# Patient Record
Sex: Female | Born: 1940
Health system: Southern US, Community
[De-identification: ages and names within clinical notes are randomized; demographics above are authoritative.]

## PROBLEM LIST (undated history)

## (undated) DIAGNOSIS — I2510 Atherosclerotic heart disease of native coronary artery without angina pectoris: Secondary | ICD-10-CM

## (undated) DIAGNOSIS — I700 Atherosclerosis of aorta: Secondary | ICD-10-CM

## (undated) DIAGNOSIS — Z952 Presence of prosthetic heart valve: Secondary | ICD-10-CM

## (undated) DIAGNOSIS — I272 Pulmonary hypertension, unspecified: Secondary | ICD-10-CM

## (undated) DIAGNOSIS — N184 Chronic kidney disease, stage 4 (severe): Secondary | ICD-10-CM

## (undated) DIAGNOSIS — K8020 Calculus of gallbladder without cholecystitis without obstruction: Secondary | ICD-10-CM

## (undated) DIAGNOSIS — I10 Essential (primary) hypertension: Secondary | ICD-10-CM

## (undated) DIAGNOSIS — Z7901 Long term (current) use of anticoagulants: Secondary | ICD-10-CM

## (undated) DIAGNOSIS — E7800 Pure hypercholesterolemia, unspecified: Secondary | ICD-10-CM

## (undated) DIAGNOSIS — I739 Peripheral vascular disease, unspecified: Secondary | ICD-10-CM

## (undated) DIAGNOSIS — I4891 Unspecified atrial fibrillation: Secondary | ICD-10-CM

## (undated) DIAGNOSIS — I779 Disorder of arteries and arterioles, unspecified: Secondary | ICD-10-CM

## (undated) HISTORY — DX: Disorder of arteries and arterioles, unspecified: I77.9

## (undated) HISTORY — DX: Atherosclerotic heart disease of native coronary artery without angina pectoris: I25.10

## (undated) HISTORY — DX: Peripheral vascular disease, unspecified: I73.9

## (undated) HISTORY — PX: AORTIC VALVE REPLACEMENT: SHX41

---

## 1999-02-04 ENCOUNTER — Emergency Department (HOSPITAL_COMMUNITY): Admission: EM | Admit: 1999-02-04 | Discharge: 1999-02-04 | Payer: Self-pay | Admitting: Emergency Medicine

## 2001-04-18 ENCOUNTER — Encounter: Payer: Self-pay | Admitting: Family Medicine

## 2001-04-18 ENCOUNTER — Encounter: Admission: RE | Admit: 2001-04-18 | Discharge: 2001-04-18 | Payer: Self-pay | Admitting: Family Medicine

## 2001-08-26 ENCOUNTER — Ambulatory Visit (HOSPITAL_COMMUNITY): Admission: RE | Admit: 2001-08-26 | Discharge: 2001-08-27 | Payer: Self-pay | Admitting: Cardiovascular Disease

## 2001-08-26 ENCOUNTER — Encounter: Payer: Self-pay | Admitting: Cardiovascular Disease

## 2003-08-10 ENCOUNTER — Ambulatory Visit (HOSPITAL_COMMUNITY): Admission: RE | Admit: 2003-08-10 | Discharge: 2003-08-10 | Payer: Self-pay | Admitting: Cardiovascular Disease

## 2004-05-13 ENCOUNTER — Encounter: Admission: RE | Admit: 2004-05-13 | Discharge: 2004-05-13 | Payer: Self-pay | Admitting: Cardiovascular Disease

## 2004-05-18 ENCOUNTER — Ambulatory Visit (HOSPITAL_COMMUNITY): Admission: RE | Admit: 2004-05-18 | Discharge: 2004-05-18 | Payer: Self-pay | Admitting: Cardiovascular Disease

## 2004-05-18 HISTORY — PX: CARDIAC CATHETERIZATION: SHX172

## 2004-07-13 ENCOUNTER — Encounter (INDEPENDENT_AMBULATORY_CARE_PROVIDER_SITE_OTHER): Payer: Self-pay | Admitting: *Deleted

## 2004-07-13 ENCOUNTER — Inpatient Hospital Stay (HOSPITAL_COMMUNITY): Admission: RE | Admit: 2004-07-13 | Discharge: 2004-07-22 | Payer: Self-pay | Admitting: Surgery

## 2004-08-09 ENCOUNTER — Encounter: Admission: RE | Admit: 2004-08-09 | Discharge: 2004-08-09 | Payer: Self-pay | Admitting: Surgery

## 2004-08-22 ENCOUNTER — Encounter (HOSPITAL_COMMUNITY): Admission: RE | Admit: 2004-08-22 | Discharge: 2004-11-20 | Payer: Self-pay | Admitting: Cardiovascular Disease

## 2004-09-16 ENCOUNTER — Encounter: Admission: RE | Admit: 2004-09-16 | Discharge: 2004-09-16 | Payer: Self-pay | Admitting: Internal Medicine

## 2004-11-09 ENCOUNTER — Inpatient Hospital Stay (HOSPITAL_COMMUNITY): Admission: AD | Admit: 2004-11-09 | Discharge: 2004-11-15 | Payer: Self-pay | Admitting: Cardiovascular Disease

## 2004-11-10 HISTORY — PX: CARDIAC CATHETERIZATION: SHX172

## 2004-11-21 ENCOUNTER — Encounter (HOSPITAL_COMMUNITY): Admission: RE | Admit: 2004-11-21 | Discharge: 2005-02-19 | Payer: Self-pay | Admitting: Cardiovascular Disease

## 2005-05-12 ENCOUNTER — Encounter: Admission: RE | Admit: 2005-05-12 | Discharge: 2005-05-12 | Payer: Self-pay | Admitting: Internal Medicine

## 2005-12-14 ENCOUNTER — Encounter: Admission: RE | Admit: 2005-12-14 | Discharge: 2005-12-14 | Payer: Self-pay | Admitting: Internal Medicine

## 2007-04-24 ENCOUNTER — Encounter: Admission: RE | Admit: 2007-04-24 | Discharge: 2007-04-24 | Payer: Self-pay | Admitting: *Deleted

## 2008-02-26 ENCOUNTER — Other Ambulatory Visit: Admission: RE | Admit: 2008-02-26 | Discharge: 2008-02-26 | Payer: Self-pay | Admitting: Family Medicine

## 2008-05-20 ENCOUNTER — Inpatient Hospital Stay (HOSPITAL_COMMUNITY): Admission: RE | Admit: 2008-05-20 | Discharge: 2008-05-31 | Payer: Self-pay | Admitting: Obstetrics and Gynecology

## 2008-09-11 ENCOUNTER — Encounter: Admission: RE | Admit: 2008-09-11 | Discharge: 2008-09-11 | Payer: Self-pay | Admitting: Obstetrics and Gynecology

## 2010-05-19 HISTORY — PX: NM MYOCAR PERF WALL MOTION: HXRAD629

## 2011-02-21 NOTE — H&P (Signed)
NAMEMALASHIA, [name] NO.:  [id number]   MEDICAL RECORD NO.:  [id number]         PATIENT TYPE:  WOIB   LOCATION:                                FACILITY:  [facility]   PHYSICIAN:  [name] A. Delcambre, MDDATE OF BIRTH:  January 23, 1941   DATE OF ADMISSION:  05/21/2008  DATE OF DISCHARGE:                              HISTORY & PHYSICAL   She is a 70 year old gravida 62, para 5-0-0-5, recently with several  urinary tract infections, history complicated by an aortic valve  replacement and on Coumadin.  She has noted over the last year bulging  of her vaginal introitus, now coming to the point where it rubs on her  underwear, getting her pressure sensation and some pain in the vaginal  pelvic area.  She has trouble having bowel movements as well.  She has  to use laxative or strain sometimes a suppository, but she does not have  to reduce anything back into the vagina nor to have a bowel movement or  void.   PAST MEDICAL HISTORY:  1. Diabetes.  2. Heart failure or heart valve problems.  3. Peripheral vascular disease.  4. Coronary artery disease.   SURGICAL INTERVENTIONS:  CABG, coronary artery disease, angioplasty.   MEDICATIONS:  1. Clonazepam 0.5 mg b.i.d. p.r.n.  2. Tramadol 50 mg 2 p.o. q.6-8 h. p.r.n.  3. Metoprolol 50 mg 1-1/2 tablets daily.  4. Warfarin 5 mg p.o. daily except for Mondays and Fridays where she      takes 7.5 mg.  5. Lisinopril 20 mg 1 p.o. daily.  6. Pravastatin 40 mg p.o. nightly.  7. Bupropion 100 mg t.i.d.  8. Vitamin D 50,000 units every week.  9. Metformin 500 mg b.i.d.  10.Furosemide 20 mg p.o. daily.   ALLERGIES:  ADVICOR, ZETIA, and ZOCOR.   She states hemoglobin A1c is 6.3 last check, fasting sugars are 90s to  110 and 115.   SOCIAL HISTORY:  No tobacco, ethanol, or drug use.  She has history of  cigarette smoking in the past.  She is widowed.  Not sexually active.   FAMILY HISTORY:  MI, osteoporosis, hypertension.  Denies family  history  of breast, uterus, ovaries, cervix, colon cancer and pulmonic or  coronary artery disease other than herself, stroke, or diabetes in her  relatives.   REVIEW OF SYSTEMS:  Depression is positive.  Denies fever, chills,  rashes, lesions, headaches, dizziness, allergies, chest pain, shortness  of breath, wheezing, diarrhea, constipation, bleeding, melena,  hematochezia, urgency, frequency, dysuria, incontinence, or hematuria.  No galactorrhea, no emotional changes at this time.   PHYSICAL EXAMINATION:  GENERAL:  Alert and oriented x3.  VITALS:  Blood pressure 150/64, heart rate 60, respirations 20,  afebrile.  HEART:  Regular rate and rhythm.  Loud click is heard, left upper  sternal border.  LUNGS:  Clear bilaterally.  ABDOMEN:  Soft and nontender.  No hepatosplenomegaly or masses noted.  PELVIC:  Normal external female genitalia.  Bartholin, urethra, Skene  within normal limit.  Vault without discharge or lesions.  Vault is well  suspended.  Large  prolapsing cystocele which comes out of introitus is  noted.  RECTOVAGINAL:  There is a small rectocele that may not need repair or  operation, if we did proceed with operation I will evaluate this.   CARDIAC HISTORY:  She did see her cardiologist, Dr. Gwenlyn [name].  There were  no EKG changes.  Post-stress ejection fraction 72%, global left  ventricular systolic function is normal, ventricular function is  preserved, and no significant wall motion abnormalities are noted.  Normal myocardial perfusion scan demonstrating an attenuation artifact  in the anterior region of the myocardium.  No ischemia or infarct scar  is seen in the remaining myocardium.  Compared to prior study, perfusion  improved, no significant ischemia demonstrated, this stated to be a low-  risk scan and the patient is cleared for surgery.  Furthermore, she has  had Doppler studies revealed moderate left ICA stenosis unchanged from  prior study.  Lower extremity  Dopplers revealed patent left iliac stent,  right iliac appears to be quite progressed, but she denies claudication.   ASSESSMENT:  Cystic medical as well as symptomatic repair of the  cystocele indicated.  Cystocele 618.01, rectocele 618.04, may not need  report, pelvic pain 625.9 from the above.   PLAN:  Discussed management versus definitive management with surgical  repair.  Pessary could be used.  She did try a pessary fit but this did  not work.  For this reason, she wishes to go and get operative repair  done per Cardiology recommendations.  We will continue Coumadin until  the evening prior to surgery, reversed with vitamin K, switched over  heparin prior to that and get therapeutic.  Stop the heparin about 2:30  to 3.  Draw a PT and PTT at 6:30.  Proceed with surgery as long as coag  parameters within normal limits.  She will be n.p.o. past midnight.  All  preoperative medications will be continued except with medications taken  in the morning with sips of water.  She gives informed consent, accepts  for risks of infection, bleeding, bowel and bladder damage, blood  product risk including hepatitis and HIV exposure, DVT risk, heart  failure, MI risk, and difficulty voiding prolonged catheter.  All  questions were answered and we will proceed as outlined.  SCDs  __________ to be used perioperatively as well.      [name] A. Bing Matter, MD  Electronically Signed     CAD/MEDQ  D:  05/19/2008  T:  05/20/2008  Job:  [id number]   cc:   [name], M.D.  [phone]   [name], M.D.  Fax: [phone]   [name], M.D.

## 2011-02-21 NOTE — Op Note (Signed)
[name], [name]                ACCOUNT NO.:  [id number]   MEDICAL RECORD NO.:  [id number]          PATIENT TYPE:  INP   LOCATION:  9310                          FACILITY:  [facility]   PHYSICIAN:  Charles A. Delcambre, MDDATE OF BIRTH:  November 12, 1940   DATE OF PROCEDURE:  DATE OF DISCHARGE:                               OPERATIVE REPORT   PREOPERATIVE DIAGNOSIS:  Cystocele.   POSTOPERATIVE DIAGNOSIS:  Cystocele.   PROCEDURE:  Anterior repair.   SURGEON:  Charles A. Bing Matter, MD.   Terrence DupontLandry Mellow.   COMPLICATIONS:  None.   ESTIMATED BLOOD LOSS:  Less than 25 mL.   OPERATIVE FINDINGS:  Very large cystocele and minimal rectocele, not  requiring repair at this time.   SPECIMENS:  None.   Instrument, sponge, and needle count correct x2.   ANESTHESIA:  General by endotracheal route.   DESCRIPTION OF PROCEDURE:  The patient was taken to the operating room,  placed in supine position and general anesthetic was dosed.  Sterile  prep and drape was undertaken.  Weighted speculum was placed in the  vault.  Cuff was identified and grasped with towels.  The clamps and  retractors was removed.  Lidocaine 1% with 1:100 epinephrine was  injected submucosally in the vagina over the cystocele to help with  hemostasis.  Total of 10 mL was injected.  Knife was then used to score  the vaginal cuff without damage to bowel or bladder.  The bladder and  cystocele were very thin, however, and careful dissection did develop  the bladder mucosa off of the vaginal mucosa exposing the cystocele.  Flaps were created, some sharp dissection with Metzenbaum scissors and  blunt dissection was used to develop the bladder flaps, and weak areas  were strengthened with 2-0 Vicryl with good hemostasis.  The large  cystocele was then replaced with a pursestring and 2-0 Vicryl reducing  well to a smaller size, allowing further repair.  A 2-0 Vicryl was then  used to create endofascial shelf, plicating the  endofascia with  individual sutures giving a good repair.  Hemostasis was adequate.  Excess vaginal mucosa was resected and the vaginal incision was  then closed with 2-0 Vicryl running locking suture.  Hemostasis was  excellent.  A 1-inch gauze with Estrace cream was then placed into the  vagina for vaginal pack.  Hemostasis was excellent at the termination of  procedure.  She was awakened and taken to recovery with physician in  attendance.      Charles A. Bing Matter, MD  Electronically Signed     CAD/MEDQ  D:  05/21/2008  T:  05/21/2008  Job:  [id number]

## 2011-02-24 NOTE — Op Note (Signed)
NAMEJOLYNNE, [name] NO.:  [id number]   MEDICAL RECORD NO.:  [id number]          PATIENT TYPE:  INP   LOCATION:  2307                         FACILITY:  [facility]   PHYSICIAN:  Nelda Severe. Tobias Alexander, M.D.  DATE OF BIRTH:  November 27, 1940   DATE OF PROCEDURE:  07/13/2004  DATE OF DISCHARGE:                                 OPERATIVE REPORT   PROCEDURE:  Transesophageal echocardiogram.   OPERATOR:  Nelda Severe. Tobias Alexander, M.D.   INDICATIONS:  The patient has coronary artery disease and aortic stenosis,  is to undergo aortic valve replacement and coronary artery bypass grafting  by Dr. Gilford Raid.  TEE was requested by Dr. Cyndia Bent for the intraoperative  management of the patient.   DESCRIPTION OF PROCEDURE:  Following a routine cardiac induction, the  transesophageal probe was covered with a latex free sheath, lubricated and  inserted into the patient's oropharynx and esophagus without difficulty.  Overall cardiac images demonstrated no evidence of pericardial effusion or  masses. The right atrium appeared normal in size, there was no evidence of  masses or thrombus in the right atrium.  The inner atrial septum had no  evidence of defect by color Doppler.   The tricuspid valve appeared normal in structure with trace regurgitation.  The pulmonary artery catheter was seen crossing the valve, entering the  right ventricle.  The right ventricle had good contractility with no  evidence of segmental wall motion abnormalities, no masses or thrombus were  seen in the right ventricle.   The left atrium was then evaluated, which was normal sized, had no evidence  of thrombus or masses.  The mitral valve had 1+ regurgitation with a small  jet tracking along the lateral wall.  Pulmonary vein flows demonstrated  normal forward flow with an S wave of 45 and a D wave of 35.  There was no  evidence of reversal of flow in the left or right pulmonary vein.   The left ventricle was slightly  thickened with left ventricular wall  thickness of 1.1-1.2 cm.  It demonstrated good contractility with no  evidence of segmental wall motion abnormalities.   [name] aortic valve was then evaluated, which appeared to be  bicuspid.  By planimetry, there was an area of about 0.9 cm/sq. There was no  regurgitation but severe stenosis was evident.  Peak flows across the valve  were measured and found to be over 400, averaging about 420 cm/second.  The  aortic valve area was calculated at 0.78 cm/sq.  The patient had some mild  atherosclerotic disease of the descending aorta, but otherwise, the exam was  normal.   Following replacement of the aortic valve and coronary artery bypass  grafting, the patient was evaluated again.  The prosthetic valve appeared to  be functioning normally. There was no evidence of a perivalvular leak and  there was no  evidence of stenosis on Doppler flow.  The left ventricle appeared to have  good contractility and there was no evidence of segmental wall motion  abnormalities.  The probe was then removed, and the patient  was taken to the  SICU in good condition.       JDS/MEDQ  D:  07/13/2004  T:  07/13/2004  Job:  [id number]

## 2011-02-24 NOTE — Cardiovascular Report (Signed)
NAME:  [name], [name]                          ACCOUNT NO.:  [id number]   MEDICAL RECORD NO.:  [id number]                   PATIENT TYPE:  OIB   LOCATION:  2854                                 FACILITY:  [facility]   PHYSICIAN:  Quay Burow, M.D.                DATE OF BIRTH:  12/07/1940   DATE OF PROCEDURE:  05/18/2004  DATE OF DISCHARGE:  05/18/2004                              CARDIAC CATHETERIZATION   INDICATIONS FOR PROCEDURE:  The patient is a 70 year old white female with a  history of hypertension and hyperlipidemia, valvular heart disease.  The  patient has had stenting of her left iliac artery for claudication in the  past and has moderate carotid disease on the left side.  She has severe  aortic stenosis which we are following by echocardiogram with a valve area  of approximately 0.4 cm squared.  The patient is asymptomatic.  Because of  the critical nature of her valvular heart disease, she presents now for  right and left heart catheterization to define her anatomy and validate her  valve area.   DESCRIPTION OF PROCEDURE:  The patient was brought to the second floor [facility]iac catheterization lab in the postabsorptive state.  She was  premedicated with p.o. Valium.  Her right groin was prepped and shaved in  the usual sterile fashion.  1% Xylocaine was used for local anesthesia.  A 7  French sheath was inserted into the right femoral artery using standard  Seldinger technique.  An 8 French sheath was inserted into the right femoral  vein.  A 7 French balloon-tip thermodilution Swan-Ganz catheter was used to  obtain sequential right heart pressures, Fick and thermodilution cardiac  outputs.  5 French left 3.5 diagnostic catheter along with a 6 French right  No-Torque catheter, 6 [place] multipurpose A1 catheter and pigtail catheter  were used for selective coronary angiography, root angiography, and distal  abdominal aortography.  Visipaque dye was used for the  entirety of the case.  Retrograde aortic pressure was monitored during the case.  Simultaneous LV  and AO pullback pressures were obtained.   HEMODYNAMICS:  RA 4 mm, RV 29/8, PA 29/7 mean of 18, wedge 10.  Cardiac  output was 3.5 liters per minute with index of 2 liters per minute per meter  square by Fick.  The peak instantaneous gradient was 60 mmHg with a mean of  50. The aortic valve area measured approximately 0.46 cm square by Fick.  The aortic pressure was 145/52 with a left ventricular pressure of 218/19.   SELECTIVE CORONARY ANGIOGRAPHY:  1. Left main normal.  2. LAD; the LAD had approximately 50 to 70% midstenosis.  3. Left circumflex; free of significant disease.  4. Right coronary artery; dominant with a 50% stenosis at the crux of the     vessel.  5. Supravalvular aortography performed in the RAO view using 20 mL of  Visipaque dye at 20 mL per second.  There was no aortic insufficiency     noted.  The aortic root appeared normal in dimension.  There was no     leaflet motion noted fluoroscopically.  6. Distal abdominal aortography performed using 20 mL of Omnipaque dye at 20     mL per second. The renal arteries were widely patent.  There is mild     narrowing of the infrarenal abdominal aorta.  There is 50% right common     iliac artery ostial stenosis.  There is 90% in-stent restenosis within     the midportion of the left iliac stent at the distal portion of the     common and origin of the internal iliac.   IMPRESSION:  The patient has moderate two vessel CAD, preserved LV function,  and critical AS.  Plans will be to hydrate her, send her home later today as  an outpatient. I will see her back in the office in one week.  We will  discuss referral to CVTS for elective CABG/AVR.  The patient left the lab in  stable condition.                                               Quay Burow, M.D.    Geralynn Rile  D:  05/18/2004  T:  05/19/2004  Job:  [id number]   cc:    [facility]   Marcelino Duster, M.D.  [phone] [address] [place] [address]  Fax: [phone]

## 2011-02-24 NOTE — Discharge Summary (Signed)
NAMEROGAN, NOA                ACCOUNT NO.:  [id number]   MEDICAL RECORD NO.:  [id number]          PATIENT TYPE:  INP   LOCATION:  9310                          FACILITY:  [facility]   PHYSICIAN:  Charles A. Delcambre, MDDATE OF BIRTH:  1941/03/22   DATE OF ADMISSION:  05/20/2008  DATE OF DISCHARGE:  05/31/2008                               DISCHARGE SUMMARY   PRIMARY DISCHARGE DIAGNOSES:  1. Cystocele.  2. Mechanical aortic valve requiring anticoagulation.   PROCEDURE:  1. Admission for anticoagulation management.  2. Anterior repair of cystocele.  3. Management of anticoagulation until discharge.   SPECIMENS:  None.   LABORATORY:  Final hematocrit 33.3, hemoglobin 11.2, platelets 264,  final INR prior to discharge 4.0 with a target of 3-4 per Cardiology.   HISTORY AND PHYSICAL:  As dictated on the chart.   HOSPITAL COURSE:  The patient was admitted, underwent vitamin K  injection to reverse protime.  Heparin was utilized and once protime was  noted to be 1.4 a.m. after the admission, heparin had been stopped at  0200, and we proceeded with anterior repair.  Heparin was restarted at  1700 and 10 mg with Coumadin was given the evening of surgery.  Coumadin  was continuously monitored with pharmacy, and the heparin was adjusted  as needed to maintain therapeutic dosing until heparin could be  discontinued with therapeutic dose of Coumadin.  At time of discharge,  INR was 4.1 and she was discharged home to follow up in the Cardiology  Coumadin Clinic and see me back in 4 weeks.      Charles A. Bing Matter, MD  Electronically Signed     CAD/MEDQ  D:  06/24/2008  T:  06/25/2008  Job:  [id number]

## 2011-02-24 NOTE — Cardiovascular Report (Signed)
[place]  Patient:    [name], [name] Visit Number: [id number] MRN: [id number]          Service Type: DSU Location: [id number] Attending Physician:  [name], [name] Dictated by:   Lorretta Harp, M.D. Proc. Date: 08/26/01 Admit Date:  08/26/2001   CC:         [name] Angiographic Suite (6th floor)  [name], M.D.  [facility]; [address], [place], [place] [address]                        Cardiac Catheterization  PROCEDURE:  Abdominal aortogram with bipolar runoff, percutaneous transluminal angioplasty and stent procedure.  SURGEON:  Lorretta Harp, M.D.  INDICATIONS:  [name] is a 70 year old moderately overweight widowed white female, mother of five children, referred by Dr. [name] for evaluation of claudication.  Cardiac history is positive for one pack per day tobacco abuse, non-insulin requiring diabetes, hypertension, hyperlipidemia and a positive family history for heart disease.  She has been complaining of claudication for years, left greater than right limiting her ambulation less than one block.  Lower extremity Dopplers performed revealing a right brachial index of 0.8 and a left of 0.4.  She had moderate aortic stenosis above an area of 1 cm sq, carotid Dopplers which were essentially benign with mild left ICA stenosis.  The cardiac stress test showed no significant ischemia.  The patient presents now for aortography, bifemoral runoff and potential endovascular therapy.  DESCRIPTION OF PROCEDURE:  The patient is brought to the [facility] Sixth Floor Peripheral Vascular Angiographic Suite in the postabsorptive state.  She is premedicated with p.o. Valium.  Both groins were prepped and shaved in the usual sterile fashion.  1% Xylocaine was used for local anesthesia.  A 5 French sheath was inserted into the right femoral artery using standard Seldinger technique.  A 5  [place] tennis racket catheter was used for midstream and distal abdominal aortography as well as bifemoral runoff.  Visipaque dye was used for the entirety of the case.  Retrograde aortic pressure was used for the remainder of the case.  ANGIOGRAPHIC RESULTS: 1. Abdominal aorta.    a. Renal arteries: Negative.    b. Infrarenal abdominal aorta:  Moderate atherosclerosis. 2. Left lower extremity.    a. Totally occluded left common iliac with reconstitution at the       external iliac artery level.    b. Three vessel runoff. 3. Right lower extremity.    a. 40% right common iliac artery stenosis.    b. Three vessel runoff.  IMPRESSION:  [name] has a totally occluded right common iliac artery.  We will proceed with endovascular therapy.  DESCRIPTION OF PROCEDURE:  Using antegrade injection from the contralateral side as well as a Smart needle, retrograde access was obtained.  Using a long 7 French 37 cm long Cordis sheath along with a 5 [place] end hole catheter and an 0.35 angle Glidewire, access was obtained to the aorta through the occluded segment.  The end-hole catheter was then advanced into the distal aorta and aortic pressures were monitored.  A Wooley wire was then exchanged and PTA was performed with a 4 x 6 followed by 4 x 6 POWERFLEX resulting in restoration of antegrade flow.  A 8 x 6 Smart stent was then deployed at the ostium of the left common iliac artery extending to the internal external  bifurcation and post dilated with a 6 x 6 POWERFLEX resulting in a reduction of long segment total occlusion to 0% residual.  The patient tolerated the procedure well. Completion abdominal aortography was performed.  Both sheaths were removed, and the ACT was measured.  Pressure was held on each groin to achieve hemostasis. The patient left the lab in stable condition.  She will be hydrated overnight and treated with aspirin and Plavix.  OVERALL IMPRESSION:  Successful left common  iliac artery percutaneous transluminal angioplasty and stenting of a total occlusion.  Both sheaths were removed, and the ACT was measured.  DISPOSITION:  She will be discharged home in the morning and obtain outpatient Dopplers.  She will see me back in the office in two to three weeks in follow-up.Dictated by:   Lorretta Harp, M.D. Attending Physician:  [name], [name] DD:  08/26/01 TD:  08/26/01 Job: [id number]

## 2011-02-24 NOTE — Discharge Summary (Signed)
[name], [name] NO.:  [id number]   MEDICAL RECORD NO.:  [id number]          PATIENT TYPE:  INP   LOCATION:  2025                         FACILITY:  [facility]   PHYSICIAN:  Gilford Raid, M.D.     DATE OF BIRTH:  1940/12/31   DATE OF ADMISSION:  07/13/2004  DATE OF DISCHARGE:  07/21/2004                                 DISCHARGE SUMMARY   ADMISSION DIAGNOSIS:  Aortic stenosis and two vessel coronary artery  disease.   DISCHARGE DIAGNOSIS:  1.  Hypertension.  2.  Hyperlipidemia.  3.  History of heavy smoking.  4.  Peripheral vascular disease.  5.  Non-insulin dependent diabetes mellitus.  6.  Status post left common iliac artery stenting for claudication.  7.  Status post hysterectomy.  8.  Aortic stenosis status post aortic valve replacement with a St. Jude      Regent mechanical valve.  9.  Two vessel coronary artery disease status post coronary artery bypass      grafting x 2.  10. Anxiety.  11. Postoperative atrial fibrillation which converted back to normal sinus      rhythm on intravenous Amiodarone.   ALLERGIES:  Zocor, Plavix, Zetia.   BRIEF HISTORY:  The patient was evaluated at the [facility] office by Dr. Cyndia Bent  after referral from Dr. [name] for aortic valve replacement and coronary  bypass surgery.  The patient is a 70 year old female with a history of  hypertension, hyperlipidemia, peripheral vascular disease, and smoking.  She  has been followed for sometime for her aortic stenosis.  The patient  remained relatively asymptomatic, however, a recent echocardiogram in July  2005 showed an aortic valve area of 0.38 cm square with normal left left  ventricular function.  She also underwent a right and left heart cath on  May 18, 2004, which revealed two vessel coronary artery disease.  She was  also noted to have a 90% focal restenosis and left common iliac artery  stent.  Echocardiogram performed on April 20, 2004, showed a normal left  ventricular ejection fraction with mild asymmetric left ventricular  hypertrophy and no regional wall abnormalities.  The mitral valve was  normal.  Carotid Doppler examination showed no significant internal carotid  artery stenosis bilaterally.  After evaluation of the patient, it was Dr.  Vivi Martens opinion that the patient would be best served with an aortic valve  replacement and coronary artery bypass graft surgery.   HOSPITAL COURSE:  The patient was admitted and taken to the OR on July 18, 2004, for coronary artery bypass graft surgery x 2 and an aortic valve  replacement.  The left internal mammary artery was grafted to the left  anterior descending and saphenous vein was grafted to the posterolateral  branch of the right coronary artery.  The aortic valve was replaced using a  19 mm St. Jude regent mechanical valve.  Endoscopic vessel harvesting was  performed on the right leg.  The patient tolerated the procedure well and  was hemodynamically stable immediately postoperatively.  The patient was  transferred from the OR to  the SICU in stable condition.  The patient was  extubated without complications and woke up from anesthesia neurologically  intact.  The patient was given a unit of packed RBCs during the procedure.   On postoperative day one, the patient was afebrile and with stable vital  signs and was maintaining a normal sinus rhythm.  Her EKG showed no ischemic  changes.  On physical exam, she was alert with neurological exam intact and  the lungs revealed a slight wheeze.  Heart was regular rate and rhythm with  a crisp valve click.  Lower extremities revealed mild edema.  The patient  was in stable condition and invasive lines and chest tubes were discontinued  in a routine manner.  The patient was volume overloaded postoperatively and  has been diuresed accordingly.  [name] Wellbutrin and Xanax were  restarted for anxiety.  She was also restarted on her Advair  for a history  of COPD and smoking.  The patient was subsequently transferred from the SICU  to unit [id number] without complications.   The patient began cardiac rehab on postop day one and initially did not  tolerate this very well.  Her gait was steady, however, she had to rest  several times in the beginning of her rehab and also required increased O2  via nasal cannula secondary to desaturation with ambulation.  The patient  has continued to ambulate with cardiac rehab and has progressed to a  satisfactory manner at this point and is doing so with a rolling walker  without further assistance and is able to maintain her O2 saturation at an  appropriate level.   On postoperative day five, the patient was noted to be in atrial  fibrillation.  Her heart rate ranged between the 120s and 140s.  Her vital  signs were, otherwise, stable.  She was still requiring 2 liters of oxygen  via nasal cannula at that time.  The patient was started on an Amiodarone  drip for her atrial fibrillation.  The patient was already on a beta blocker  and this was continued.  On physical exam, the patient was also still volume  overloaded and her diuresis was continued.  By postoperative day six, the  patient had converted back to a normal sinus rhythm and was maintaining this  and, therefore, the Amiodarone was discontinued.  [name] vital signs  were again stable with the exception of the continued need for 2 liters  oxygen via nasal cannula.   [name] pulmonary status has improved within the last few days of the  postoperative course.  She has been encouraged with her incentive spirometry  which she has been doing quite well with.  The patient has also been  continued on Advair.  She has been successfully weaned from oxygen at this  time and is maintaining acceptable oxygen saturations.  The patient has been anticoagulated with Coumadin since postoperative day two secondary to  mechanical  valve.   This obviously will need to be continued after discharge  with regular INR checks.   On postoperative day eight, the patient is afebrile and has stable vital  signs.  She is maintaining sinus rhythm.  Her weight is approximately  equivalent to her preoperative weight and she has diuresed well  postoperatively.  On physical exam, the patient states she is feeling  better.  The lung sounds are slightly diminished in the bases.  Cardiac  reveals regular rate and rhythm with a 2/6 systolic flow murmur.  There are  no gallops and no aortic insufficiency.  The abdomen is benign and there is  no tympani or distention.  The incisions are healing well and the  extremities reveal slight edema.  The patient has continued to make steady  progress and will need to continue her rehab after discharge.  The patient  will also be instructed to continue to work on her pulmonary toilet.  The  patient is in stable condition at this time and is felt to be ready for  discharge within 1-2 days pending morning round re-evaluation.   LABORATORY DATA:  CBC and BMP on July 21, 2004, with white count 6.8,  hemoglobin 11, hematocrit 34, platelets 457, sodium 132, potassium 4, BUN  12, creatinine 1.1, glucose 116.  PT and INR on July 21, 2004, 20.8 and  2.3.   CONDITION ON DISCHARGE:  Improved.   DISCHARGE MEDICATIONS:  1.  Toprol XL 50 mg daily.  2.  Lisinopril 10 mg daily.  3.  Lipitor 20 mg daily.  4.  Lasix 40 mg daily x 7 days.  5.  Potassium chloride 40 mEq daily x 7 days.  6.  Actos 30 mg daily.  7.  Alprazolam 0.25 mg b.i.d.  8.  Wellbutrin 150 mg b.i.d.  9.  Advair 1 puff b.i.d.  10. Colace 100 mg b.i.d. p.r.n. constipation.  11. Coumadin 5 mg daily then as directed by Dr. [name].  [name] INR      will need to be maintained between 2.5 and 3.5.  12. Glucophage 500 mg b.i.d.  13. Ultram 50 mg 1-2 p.o. q.4-6h. p.r.n. pain.   DISCHARGE INSTRUCTIONS:  Activities:  No driving and no lifting  more than 10  pounds and the patient is to continue daily breathing and walking exercises.  Diet:  Low salt, low fat with carbohydrate modified medium calorie per ADA  recommendations.  Wound care:  The patient may shower daily and clean the  incisions with soap and water.  If the incisions become red, swollen, or  draining, the patient has a fever of 101, she is to contact the CVTS office.  Special instructions:  The patient is to have her PT and INR checked on  July 25, 2004, by Dr. Kennon Holter office.  The patient will be instructed to  contact Dr. Kennon Holter office to arrange a date and time.  Follow up with Dr.  [name] two weeks after discharge.  The patient will be instructed to contact  his office to establish an appointment.  The patient is to have a chest x-  ray taken at [facility] one hour before the follow up  appointment with Dr. Cyndia Bent on August 09, 2004, at 12:45 p.m.      [place]   AY/MEDQ  D:  07/21/2004  T:  07/21/2004  Job:  [id number]   cc:   Quay Burow, M.D.  Fax: [phone]

## 2011-02-24 NOTE — Op Note (Signed)
NAMECHANIA, EKDAHL NO.:  [id number]   MEDICAL RECORD NO.:  [id number]          PATIENT TYPE:  INP   LOCATION:  2307                         FACILITY:  [place]   PHYSICIAN:  Gilford Raid, M.D.     DATE OF BIRTH:  1941-06-23   DATE OF PROCEDURE:  07/13/2004  DATE OF DISCHARGE:                                 OPERATIVE REPORT   PREOPERATIVE DIAGNOSES:  1.  Two-vessel coronary artery disease.  2.  Critical aortic stenosis.   POSTOPERATIVE DIAGNOSES:  1.  Two-vessel coronary artery disease.  2.  Critical aortic stenosis.   OPERATIVE PROCEDURES:  1.  Median sternotomy.  2.  Extracorporeal circulation.  3.  Coronary artery bypass graft surgery x 2 using a left internal mammary      artery graft to the left anterior descending coronary artery with a      saphenous vein graft to the posterolateral branch of the right coronary      artery.  4.  Aortic valve replacement using a 19 mm St. Jude Regent mechanical valve.  5.  Endoscopic vein harvesting from the right leg.   ATTENDING SURGEON:  Gilford Raid, M.D.   FIRST ASSISTANT:  Lanelle Bal, M.D.   SECOND ASSISTANT:  Suzzanne Cloud, P.A.   ANESTHESIA:  General endotracheal.   CLINICAL HISTORY:  The patient is a 70 year old woman with a history of  hypertension, hyperlipidemia, heavy smoking and peripheral vascular disease,  who has been followed for aortic stenosis.  She has remained relatively  asymptomatic, but an echocardiogram in July of 2005 showed an aortic valve  area of 0.38 sq cm with normal left ventricular function.  She underwent  right and left heart catheterization on May 18, 2004, which showed a  valve area of 0.46 cm sq cm with a peak gradient of 60 mmHg.  There was also  two-vessel coronary artery disease with 50-70% LAD stenosis and a 50% distal  right artery stenosis before a large posterolateral branch.  She also had  about 90% focal restenosis with in the left common iliac artery  stent.  Right and left heart pressures were within normal limits.  The left  ventricular ejection fraction was normal by echocardiogram with mild  asymmetric left ventricular hypertrophy and no regional wall motion  abnormalities.  The mitral valve appeared normal without regurgitation.  After review of the angiogram and examination of the patient, it was felt  that aortic valve replacement and coronary artery bypass surgery was the  best treatment to prevent further ischemia, infarction and complications of  aortic stenosis.  I discussed the operative procedure with the patient and  her family, including alternatives, benefits and risks, including bleeding,  blood transfusion, infection, stroke, myocardial infarction, graft failure  and death.  I also discussed the use of a mechanical valve, including the  need for lifelong anticoagulation with Coumadin.  She understood and agreed  to proceed with surgery.   DESCRIPTION OF PROCEDURE:  The patient was taken to the operating room and  placed on the table in the supine position.  After induction of  general  endotracheal anesthesia, a Foley catheter was placed in the bladder using  sterile technique.  Then the chest, abdomen and both lower extremities were  prepped and draped in usual sterile manner.  A transesophageal  echocardiogram was performed by anesthesiology.  This showed a bicuspid  aortic valve with severe aortic stenosis.  The valve area was measured at  0.7-0.8 sq cm.  There was no mitral regurgitation.  Left ventricular  function appeared well preserved with mild left ventricular hypertrophy.   Then the chest, abdomen and both lower extremities were prepped and draped  in usual sterile manner.  The chest was entered through a median sternotomy  incision.  The pericardium was opened in the midline.  Examination of the  heart showed good ventricular contractility.  The ascending aorta had no  palpable plaques in it.   Then the  left internal mammary artery was harvested from the chest wall as a  pedicle graft.  This was a medium caliber vessel with excellent blood flow  through it.  At the same time, a segment of greater saphenous vein was  harvested from the right leg using endoscopic vein harvest technique.  This  vein was of medium size and good quality.   Then the patient was heparinized and when an adequate activating clotting  time was achieved, the distal ascending aorta was cannulated using a 20  French aortic cannula for arterial inflow.  Venous outflow was achieved  using a two-stage venous cannula to the right atrial appendage.  An  antegrade cardioplegia and vent cannula were inserted in the aortic root.  A  left ventricular vent was placed through the right superior pulmonary vein.  A retrograde cardioplegia cannula was inserted through the right atrium into  the coronary sinus.   The patient was placed on cardiopulmonary bypass and the distal coronary was  identified.  The LAD was a large graftable vessel with no significant distal  disease in it.  The right coronary artery had diffuse disease in its  proximal and mid portions.  There was some plaque present distally in the  area of stenosis noted on angiogram.  The posterior descending was a small  vessel, but the posterolateral was a large vessel.   Then the aorta was cross clamped and 500 mL of cold blood antegrade  cardioplegia was administered in the aortic root with quick arrest of the  heart.  Systemic hypothermia to 20 degrees Centigrade and topical  hypothermia with iced saline was used.  A temperature probe was placed in  the septum and insulating pad in the pericardium.   Then the first distal anastomosis was performed to the posterolateral branch  of the right coronary artery.  The internal diameter was about 1.75 mm.  The  conduit used was a segment of greater saphenous vein and the anastomosis was performed in end-to-side manner  using continuous 7-0 Prolene suture.  Flow  was noted through the graft and was excellent.   The second distal anastomosis was performed to the mid portion of the left  anterior descending coronary artery.  The internal diameter was about 2 mm.  The conduit used was the left internal mammary artery graft and this was  brought through an opening in the left pericardium anterior to the phrenic  nerve.  It was anastomosed to the LAD in an end to side manner using  continuous 8-0 Prolene suture.  The pedicle was tacked to the epicardium  with 6-0 Prolene sutures.  Then another dose of cardioplegia was given.  Additional doses of cardioplegia were given at about 30-minute intervals,  maintaining myocardial temperature around 10 degrees Centigrade.   Attention was turned to aortic valve replacement.  The aorta was opened  transversely about 1 cm above the takeoff of the right coronary artery.  Examination of the native valve showed that it was a bicuspid valve with  marked thickening and fibrosis of the annulus.  The native valve was excised  without difficulty.  There was minimal calcification present.  The annulus  was truly a fish mouth shaped opening that was very thick and fibrous.  The  left ventricle and aortic root were irrigated with iced saline solution.  The annulus was sized and a 19 mm St. Jude Regent mechanical valve was  chosen.  This had model #19AGN-751, serial [id number].  Then a series of  pledgeted 2-0 Ethibond horizontal mattress sutures were placed around the  annulus with the pledgets in a subannular position.  The sutures were placed  through a __________ ring and the valve lowered into place.  The sutures  were tied sequentially and the valve seated nicely.  The leaflets moved  normally.  The right and left coronary ostiae were identified and were not  obstructed.  The patient was then rewarmed to 37 degrees Centigrade.  The  aorta was closed in two layers using  continuous 4-0 Prolene suture with felt  strips to reinforce her relatively thin aorta.  Then the single proximal  vein graft anastomosis was performed to the aortic root in end-to-side  manner using continuous 6-0 Prolene suture.   The head was placed in the Trendelenburg position.  After the left side of  the heart was deaired, the clamp was removed from the mammary pedicle.  There was rapid rewarming of the ventricular septum and return of  spontaneous ventricular fibrillation.  The cross clamp was removed with a  time of 108 minutes and the patient spontaneously converted to sinus rhythm.   The proximal and distal anastomoses appeared hemostatic and the lie of the  graft satisfactory.  Graft markers were placed around the proximal  anastomoses.  Two temporary right ventricle and right atrial pacing wires  were placed and brought out through the skin.   When the patient had rewarmed to 37 degrees Centigrade, she was weaned from cardiopulmonary bypass on low-dose dopamine.  The total bypass time was 149  minutes.  Cardiac function appeared excellent with a cardiac output at 5  L/min.  Protamine was given and the venous and aortic cannulas were removed  without difficulty.  Transesophageal echocardiogram was performed and showed  a normal functioning St. Jude aortic valve prosthesis.  There was no  evidence of perivalvular leak or regurgitation.  There was no mitral  regurgitation.  Hemostasis was achieved.  Then three chest tubes were placed  with two in the posterior pericardium, one in the left pleural space and one  in the anterior mediastinum.  The pericardium could not be reapproximated  over the heart.  The sternum was closed with #6 stainless steel wires.  The  fascia was closed with continuous 1-0 Vicryl suture.  The subcutaneous  tissues were closed with continuous 2-0 Vicryl and the skin with 3-0 Vicryl  subcuticular closure.  The lower extremity vein harvest site was closed  in  layers in a similar manner.  The sponge, needle and instrument counts were  correct according to the scrub nurse.  Dry sterile dressings were  applied  over the incisions around the chest tubes, which were hooked to Pleur-Evac  suction.  The patient remained hemodynamically stable and was transferred to  the SICU in guarded, but stable condition.      Kingsley Plan   BB/MEDQ  D:  07/13/2004  T:  07/13/2004  Job:  [id number]   cc:   [facility]TS Office   Quay Burow, M.D.  Fax: [phone]   Cardiac Catheterization Laboratory

## 2011-02-24 NOTE — Discharge Summary (Signed)
[place]  Patient:    DANNY, PONCEDELEON Visit Number: [id number] MRN: [id number]          Service Type: DSU Location: [phone] Attending Physician:  [name], [name] Dictated by:   Evern Core Johny Blamer, R.N., N.P. Admit Date:  08/26/2001 Discharge Date: 08/27/2001   CC:         Tammy R. Modena Morrow, M.D., [place], [place]   Discharge Summary  HISTORY OF PRESENT ILLNESS:  Ms. Ezma Qiu is a 70 year old female patient of Lorretta Harp, M.D., who was seen in the office by referral of Tammy R. Modena Morrow, M.D.  She apparently was having claudication, left greater than right. She went on to have lower extremity Dopplers and it revealed a right ABI of 0.8 and left of 0.4 with high frequency monophasic signals in the iliac region suggesting both supra and infrainguinal disease.  Thus she came to the hospital for PV angiogram.  In the office, she had also had a 2-D echocardiogram because of a murmur and she has an aortic stenosis.  Aortic valve area about 1 cm square.  She had her PV angiogram on August 26, 2001, and it revealed high grade disease in her left common iliac artery. She has three vessel runoff.  Her right lower extremity, she had 40% right common iliac artery stenosis.  She had three vessel runoff. She underwent PTA and stenting reduced from 100% to 0%. She was placed on aspirin and Plavix. She will be on Prevacid fro a week.  She will have follow-up lower extremity Dopplers as an outpatient on the morning of August 27, 2001.  Blood pressure was 100/52, pulse 80, temperature 97.6.  Her groin was without any hematoma right and left and no significant bruising.  CURRENT DISCHARGE MEDICATIONS: 1. Enteric coated aspirin 81 mg once per day. 2. Plavix 75 mg once per day x 4 weeks. 3. _________ 160 mg at dinner. 4. Lisinopril 20 mg once per day. 5. Actos 30 mg once per day. 6. She is to start her new medicine for her cholesterol and she  believes it is    Pravachol 20 mg at bedtime. A prescription was given to her by Dr. Modena Morrow.    She has never picked it up.  We did get a fasting lipid profile on her    prior to her admission as an outpatient and this revealed a total    cholesterol of 214, triglycerides of 125, HDL 53, and LDL of 157    with a CHD risk of 4.0.  [place]:  Sodium 135, potassium 4.2, glucose 146.  SGOT 30, SGPT 31.  INR 1.0. Hemoglobin 13.4, hematocrit 39.2, wbc 5.5, platelets 320.  ACTIVITY AT HOME:  No strenuous activity, no lifting, no heavy housework, no driving for four days. She may start exercising after one week.  DIET:  She will change her diet to a low saturated fat. She is not to eat any more than 12 grams of saturated fat per day. She will start reading labels.  DISCHARGE INSTRUCTIONS:  If she has any problems with her groin, she will call Dr. Rachel Bo office. She is to quit smoking. We discussed this.  FOLLOW-UP:  She has a follow-up appointment with Dr. [name] on September 10, 2001, at 3 p.m. for lower extremity Dopplers.  She has a follow-up appointment with Dr. [name] on September 13, 2001, at 12:15.  We did discuss diet changes, exercise, and tobacco cessation.  DISCHARGE DIAGNOSES: 1. Claudication symptoms with decreased ankle brachial indices. 2. Status post PV angiogram with 100% at left common iliac artery stenosis    reduced to 0% with percutaneous transluminal angioplasty and stenting.    Residual disease in the right lower extremity of 40% of her common iliac    artery.  She also has some atherosclerosis in her aorta. 3. Carotid stenosis, mild on the left. 4. Moderate aortic stenosis with valve area of 1 cm sq. 5. Non-insulin-dependent diabetes mellitus. 6. Hypertension. 7. Tobacco use. 8. Hyperlipidemia, mixed. She apparently had very high triglycerides and    was initially put on Tricor. She has had intolerance to Advicor in the    past.  She has been followed for  her cholesterol by Dr. Modena Morrow.  We have    recommended to her that her LDL be less than 100. Dictated by:   Evern Core Johny Blamer, R.N., N.P. Attending Physician:  [name], [name] DD:  08/27/01 TD:  08/27/01 Job: [id number]

## 2011-02-24 NOTE — Cardiovascular Report (Signed)
[name], [name] NO.:  [id number]   MEDICAL RECORD NO.:  [id number]          PATIENT TYPE:  INP   LOCATION:  [id number]                         FACILITY:  [facility]   PHYSICIAN:  Quay Burow, M.D.   DATE OF BIRTH:  [birth year]   DATE OF PROCEDURE:  11/10/2004  DATE OF DISCHARGE:                              CARDIAC CATHETERIZATION   CLINICAL HISTORY:  [name] is a 70 year old female with history of St. Jude  AVR, CABG x2 October last year for critical aortic stenosis.  She also has  PVOD status post left common iliac artery PTA and stenting with a SMART self-  expanding stent.  She is noted to have a moderate stenosis within the stent  at last catheterization and Dopplers revealed a 300 cm/second jet which we  have been following by duplex ultrasound.  Patient denies claudication.  Patient had a cardiac stress test recently that showed anterolateral  ischemia.  Other problems include hypertension, hyperlipidemia, and non-  insulin-dependent diabetes.  She presents now for Coumadin/heparin  crossover, diagnostic coronary arteriography, peripheral angiography/PTA of  her in-stent restenosis.   PROCEDURE DESCRIPTION:  The patient was brought to the second floor [facility] in the postabsorptive state.  She was  premedicated with p.o. Valium and IV Versed.  Her right groin was prepped  and shaved in the usual sterile fashion.  One percent Xylocaine was used for  local anesthesia.  A 6-French sheath was inserted into the left femoral  artery using standard Seldinger technique and a SMART Doppler tipped needle.  Visipaque dye was used for the entirety of the case.  Retrograde aortic  pressure was monitored during the case.   ANGIOGRAPHIC RESULTS:  Left main normal.   LAD:  LAD had a 50-70% segmental mid stenosis.  There was obvious  competitive flow in the distal vessel.   Left circumflex:  Free of significant disease.   Right coronary  artery:  Large dominant vessel with a 50-70% stenosis in the  PLA.  There was competitive flow noted.   Vein graft to the PLA:  Widely patent.   LIMA to the LAD:  Widely patent.  Incidentally noted 90% ulcerated ostial  left vertebral artery stenosis.   IMPRESSION:  [name] has widely patent grafts and what probably represents  a false positive Cardiolite stress test.   The existing 6-French sheath was exchanged over a Wholey wire for a long  _________ tip 30 cm long 6-French sheath.  Patient received 2500 units of  heparin intravenously.  Abdominal aortogram was performed revealing patent  renal arteries with moderate infrarenal abdominal aortic atherosclerotic  changes.  There was a 40% right common iliac artery stenosis and 70% in-  stent restenosis within the distal left common iliac artery stent.  There  was a 35 mm transstenotic gradient noted.  A 7 x 3 Powerflex balloon was  then advanced over the [facility] wire and positioned angiographically  fluoroscopically at the point of tightest stenosis which was at the takeoff  of the left internal iliac artery.  PTA was performed at 8  atmospheres with  obvious hourglass waste which was relieved.  The final angiographic result  was reduction of 75% in-stent restenosis to less than 20% without a  gradient.  Patient tolerated the procedure.  The ending ACT was  approximately 200.  A short 6-French sheath was then exchanged  over a wire and patient left the laboratory in stable condition.  Plans will  be to remove the sheath once the ACT falls below 170.  Heparin will be  restarted in six hours.  Patient will be recoumadinized with a target INR of  2.5.  She left the laboratory in stable condition.      JB/MEDQ  D:  11/10/2004  T:  11/10/2004  Job:  [id number]   cc:   Cath Lab   Marcelino Duster, M.D.  [address] [place] [address]  Fax: [phone]

## 2011-02-24 NOTE — Discharge Summary (Signed)
NAMECARMELA, [name] NO.:  [id number]   MEDICAL RECORD NO.:  [id number]          PATIENT TYPE:  INP   LOCATION:  [id number]                         FACILITY:  [facility]   PHYSICIAN:  Quay Burow, M.D.   DATE OF BIRTH:  06-Mar-1941   DATE OF ADMISSION:  11/09/2004  DATE OF DISCHARGE:  11/15/2004                                 DISCHARGE SUMMARY   DISCHARGE DIAGNOSES:  1.  Heparin/Coumadin crossover for peripheral vascular angiogram secondary      to St. Jude aortic valve.  2.  St. Jude aortic valve, with need for anticoagulation.  3.  Peripheral vascular angiogram, with percutaneous transluminal      angioplasty for iliac in-stent restenosis.  4.  Positive Cardiolite stress test.  Cardiac catheterization with patent      grafts.  False positive Cardiolite.  5.  A 90% ulcerated ostial left vertebral.  6.  Aorta with moderate atherosclerosis.  7.  Hyperlipidemia.  8.  Right groin hematoma, with negative pseudoaneurysm and negative      arteriovenous fistula.  9.  History of coronary artery disease, with coronary artery bypass grafting      in 2005.   DISCHARGE CONDITION:  Stable.   DISCHARGE MEDICATIONS:  1.  Coumadin 5 mg Monday, Wednesday, and Friday, 7.5 other days.  2.  Enteric coated aspirin 81 mg daily.  3.  Toprol XL 50 mg 1-1/2 tablets daily.  4.  Lipitor 20 mg every evening.  5.  Lisinopril 20 mg daily.  6.  Actos 30 mg every morning.  7.  Wellbutrin as before.  8.  Hold diuretic for two days.  9.  Have blood work done Thursday morning.   DISCHARGE INSTRUCTIONS:  1.  No strenuous activity for two days.  Then resume regular activity.  2.  Lowfat, low salt diabetic diet.  3.  Wash left groin catheterization site with soap and water.  Call if any      bleeding or swelling.  4.  Follow up with lower extremity arterial Dopplers, November 21, 2004 at      11:30 a.m.  5.  Follow up with Dr. [name], December 02, 2004 at 9:00 a.m.   DISCHARGE CONDITION:   Stable.   PROCEDURES:  1.  On November 10, 2004, __________ left heart catheterization with graft      visualization by Dr. Quay Burow.  2.  On November 10, 2004, PV angiogram and PTA of the left common iliac      artery inside the stent.   HISTORY OF PRESENT ILLNESS:  A 70 year old white female with a history of  coronary disease, valvular heart disease, and peripheral vascular occlusive  disease, came to the hospital on November 10, 2004 for outpatient procedures.  She has a history of St. Jude #19 aortic valve replacement with bypass  grafting x 2 in October 2005 for chronic AS and coronary disease.  She had a  LIMA implanted to the LAD and a saphenous vein graft to the RCA.  She has  been on Coumadin anticoagulation.   She also has peripheral vascular disease  and totally occluded left common  iliac, undergoing PTA in 2002, with stent formation.  She has in-stent  restenosis with the left common iliac artery stent, and Dopplers suggest  high-frequency signal in the area.  She denies any claudication.   Other issues include hypertension, hyperlipidemia, noninsulin-requiring  diabetes type 2.   The patient had had a Cardiolite study from our office and revealed lateral  and anterolateral ischemia.  Plans were to bring her in for cardiac  catheterization as well as peripheral angiogram with PTA.   ALLERGIES:  1.  AVECOR.  2.  ZETIA.  3.  ZOCOR.   OUTPATIENT MEDICATIONS:  1.  Toprol XL 50.  2.  Lisinopril 20.  3.  Wellbutrin.  4.  Lipitor 20.  5.  Actos 30.  6.  Coumadin.   FAMILY HISTORY/SOCIAL HISTORY/REVIEW OF SYSTEMS:  See H&P.   PHYSICAL EXAMINATION AT DISCHARGE:  GENERAL:  Alert, oriented female in no  acute distress.  SKIN:  Warm and dry.  VITAL SIGNS:  Blood pressure 127/59; pulse 50; respirations 18; temperature  96.6; oxygen saturation 98%.  HEART:  S1, S2.  Regular rate and rhythm.  LUNGS:  Clear.  ABDOMEN:  Positive bowel sounds.  EXTREMITIES:  Left  groin with small hematoma.   LABORATORY DATA:  Hemoglobin 12.9, hematocrit 36.9, WBC 4.6, platelets 225  on admission.  At discharge, hemoglobin was 10.7, hematocrit 30.6.   Protime on arrival to the hospital:  PT 15, INR 1.3, PTT 33.  Heparin was  started, and prior to discharge PT 20, INR 2.1.  Heparin was discontinued.  Chemistries:  Sodium 134, potassium 3.8, chloride 101, CO2 28, glucose 99,  BUN 17, creatinine 1, calcium 9.9.  Glucose elevated up to 188.  At  discharge, it was 139.  UA:  Moderate amount of leukocyte esterase.  Urine  culture:  Insignificant growth.  Chest x-ray, two views:  COPD.  No active  disease.  EKG:  Sinus rhythm, sinus bradycardia, with first-degree AV block.  Vascular ultrasound of the right groin:  No evidence of pseudoaneurysm or AV  fistula.  For cardiac catheterization and PV angiogram, please see those  dictated notes.   HOSPITAL COURSE:  [name] was admitted November 09, 2004 for outpatient  procedures.  She underwent those procedures without complications, except  for a hematoma in the right groin.  It was negative for pseudoaneurysm or AV  fistula.  By November 15, 2004, her INR was therapeutic, and she was ready  for discharge home.  She will follow up with Dr. [name].      LRI/MEDQ  D:  01/10/2005  T:  01/10/2005  Job:  [id number]   cc:   Quay Burow, M.D.  Fax: [phone]   Marcelino Duster, M.D.  [address] [place] [address]  Fax: [phone]

## 2011-07-07 LAB — GLUCOSE, CAPILLARY
Glucose-Capillary: 118 — ABNORMAL HIGH
Glucose-Capillary: 118 — ABNORMAL HIGH
Glucose-Capillary: 127 — ABNORMAL HIGH
Glucose-Capillary: 144 — ABNORMAL HIGH
Glucose-Capillary: 148 — ABNORMAL HIGH

## 2011-07-07 LAB — CBC
HCT: 33.4 — ABNORMAL LOW
HCT: 38.9
Hemoglobin: 11.6 — ABNORMAL LOW
Hemoglobin: 13.3
MCHC: 34.3
MCHC: 34.7
MCV: 89.9
Platelets: 262
RBC: 3.67 — ABNORMAL LOW
RBC: 4.33
RDW: 12.2
RDW: 12.3
WBC: 4.9

## 2011-07-07 LAB — COMPREHENSIVE METABOLIC PANEL
ALT: 23
AST: 24
Albumin: 4.2
Alkaline Phosphatase: 56
BUN: 10
CO2: 28
Calcium: 9.5
Chloride: 100
Creatinine, Ser: 0.66
GFR calc Af Amer: >60
GFR calc non Af Amer: >60
Glucose, Bld: 128 — ABNORMAL HIGH
Potassium: 3.8
Sodium: 135
Total Bilirubin: 0.6
Total Protein: 7

## 2011-07-07 LAB — URINALYSIS, ROUTINE W REFLEX MICROSCOPIC
Bilirubin Urine: NEGATIVE
Glucose, UA: NEGATIVE
Ketones, ur: NEGATIVE
Nitrite: NEGATIVE
Protein, ur: NEGATIVE
Specific Gravity, Urine: <1.005 — ABNORMAL LOW
Urobilinogen, UA: 0.2
pH: 6.5

## 2011-07-07 LAB — PROTIME-INR
INR: 1.4
INR: 1.8 — ABNORMAL HIGH
INR: 3.1 — ABNORMAL HIGH
Prothrombin Time: 15.9 — ABNORMAL HIGH
Prothrombin Time: 17.5 — ABNORMAL HIGH
Prothrombin Time: 22 — ABNORMAL HIGH
Prothrombin Time: 34.2 — ABNORMAL HIGH

## 2011-07-07 LAB — URINE MICROSCOPIC-ADD ON

## 2011-07-07 LAB — APTT
aPTT: 36
aPTT: 59 — ABNORMAL HIGH

## 2011-07-07 LAB — HEPARIN LEVEL (UNFRACTIONATED)
Heparin Unfractionated: 0.39
Heparin Unfractionated: 0.44

## 2011-10-06 ENCOUNTER — Emergency Department (HOSPITAL_COMMUNITY): Payer: Medicare Other

## 2011-10-06 ENCOUNTER — Encounter: Payer: Self-pay | Admitting: *Deleted

## 2011-10-06 ENCOUNTER — Other Ambulatory Visit: Payer: Self-pay

## 2011-10-06 ENCOUNTER — Inpatient Hospital Stay (HOSPITAL_COMMUNITY)
Admission: EM | Admit: 2011-10-06 | Discharge: 2011-10-09 | DRG: 149 | Disposition: A | Discharge status: Home Health | Payer: Medicare Other | Attending: Family Medicine | Admitting: Family Medicine

## 2011-10-06 DIAGNOSIS — E7800 Pure hypercholesterolemia, unspecified: Secondary | ICD-10-CM | POA: Diagnosis present

## 2011-10-06 DIAGNOSIS — E871 Hypo-osmolality and hyponatremia: Secondary | ICD-10-CM | POA: Diagnosis present

## 2011-10-06 DIAGNOSIS — J111 Influenza due to unidentified influenza virus with other respiratory manifestations: Secondary | ICD-10-CM | POA: Diagnosis present

## 2011-10-06 DIAGNOSIS — H8110 Benign paroxysmal vertigo, unspecified ear: Principal | ICD-10-CM | POA: Diagnosis present

## 2011-10-06 DIAGNOSIS — J329 Chronic sinusitis, unspecified: Secondary | ICD-10-CM | POA: Diagnosis present

## 2011-10-06 DIAGNOSIS — Z79899 Other long term (current) drug therapy: Secondary | ICD-10-CM

## 2011-10-06 DIAGNOSIS — Z7901 Long term (current) use of anticoagulants: Secondary | ICD-10-CM

## 2011-10-06 DIAGNOSIS — Z952 Presence of prosthetic heart valve: Secondary | ICD-10-CM | POA: Diagnosis present

## 2011-10-06 DIAGNOSIS — Z7982 Long term (current) use of aspirin: Secondary | ICD-10-CM

## 2011-10-06 DIAGNOSIS — R42 Dizziness and giddiness: Secondary | ICD-10-CM

## 2011-10-06 DIAGNOSIS — E119 Type 2 diabetes mellitus without complications: Secondary | ICD-10-CM | POA: Diagnosis present

## 2011-10-06 DIAGNOSIS — D649 Anemia, unspecified: Secondary | ICD-10-CM | POA: Diagnosis present

## 2011-10-06 DIAGNOSIS — Z954 Presence of other heart-valve replacement: Secondary | ICD-10-CM

## 2011-10-06 DIAGNOSIS — I10 Essential (primary) hypertension: Secondary | ICD-10-CM | POA: Diagnosis present

## 2011-10-06 HISTORY — DX: Long term (current) use of anticoagulants: Z79.01

## 2011-10-06 HISTORY — DX: Pure hypercholesterolemia, unspecified: E78.00

## 2011-10-06 HISTORY — DX: Presence of prosthetic heart valve: Z95.2

## 2011-10-06 HISTORY — DX: Essential (primary) hypertension: I10

## 2011-10-06 HISTORY — DX: Benign paroxysmal vertigo, unspecified ear: H81.10

## 2011-10-06 LAB — BASIC METABOLIC PANEL
BUN: 11 mg/dL (ref 6–23)
Chloride: 98 mEq/L (ref 96–112)
Glucose, Bld: 109 mg/dL — ABNORMAL HIGH (ref 70–99)
Potassium: 3.7 mEq/L (ref 3.5–5.1)

## 2011-10-06 LAB — COMPREHENSIVE METABOLIC PANEL
ALT: 20 U/L (ref 0–35)
AST: 21 U/L (ref 0–37)
Alkaline Phosphatase: 54 U/L (ref 39–117)
CO2: 24 mEq/L (ref 19–32)
Chloride: 90 mEq/L — ABNORMAL LOW (ref 96–112)
GFR calc Af Amer: >90 mL/min (ref >90)
GFR calc non Af Amer: 87 mL/min — ABNORMAL LOW (ref >90)
Glucose, Bld: 158 mg/dL — ABNORMAL HIGH (ref 70–99)
Sodium: 124 mEq/L — ABNORMAL LOW (ref 135–145)
Total Bilirubin: 0.5 mg/dL (ref 0.3–1.2)

## 2011-10-06 LAB — DIFFERENTIAL
Basophils Absolute: 0 10*3/uL (ref 0.0–0.1)
Eosinophils Relative: 1 % (ref 0–5)
Lymphocytes Relative: 13 % (ref 12–46)
Lymphs Abs: 0.9 10*3/uL (ref 0.7–4.0)
Monocytes Absolute: 0.3 10*3/uL (ref 0.1–1.0)
Neutro Abs: 5.3 10*3/uL (ref 1.7–7.7)

## 2011-10-06 LAB — URINALYSIS, ROUTINE W REFLEX MICROSCOPIC
Bilirubin Urine: NEGATIVE
Glucose, UA: NEGATIVE mg/dL
Hgb urine dipstick: NEGATIVE
Ketones, ur: NEGATIVE mg/dL
Leukocytes, UA: NEGATIVE
Nitrite: NEGATIVE
Protein, ur: 30 mg/dL — AB
Specific Gravity, Urine: 1.011 (ref 1.005–1.030)
Urobilinogen, UA: 1 mg/dL (ref 0.0–1.0)
pH: 7 (ref 5.0–8.0)

## 2011-10-06 LAB — POCT I-STAT, CHEM 8
BUN: 10 mg/dL (ref 6–23)
Creatinine, Ser: 0.6 mg/dL (ref 0.50–1.10)
Potassium: 3.8 mEq/L (ref 3.5–5.1)
Sodium: 133 mEq/L — ABNORMAL LOW (ref 135–145)
TCO2: 22 mmol/L (ref 0–100)

## 2011-10-06 LAB — CBC
HCT: 31.8 % — ABNORMAL LOW (ref 36.0–46.0)
MCV: 85.7 fL (ref 78.0–100.0)
Platelets: 224 10*3/uL (ref 150–400)
RBC: 3.71 MIL/uL — ABNORMAL LOW (ref 3.87–5.11)
RDW: 12.2 % (ref 11.5–15.5)
WBC: 6.5 10*3/uL (ref 4.0–10.5)

## 2011-10-06 LAB — GLUCOSE, CAPILLARY
Glucose-Capillary: 90 mg/dL (ref 70–99)
Glucose-Capillary: 155 mg/dL — ABNORMAL HIGH (ref 70–99)

## 2011-10-06 LAB — HEMOGLOBIN A1C: Hgb A1c MFr Bld: 6.4 % — ABNORMAL HIGH (ref <5.7)

## 2011-10-06 LAB — POCT I-STAT TROPONIN I: Troponin i, poc: 0 ng/mL (ref 0.00–0.08)

## 2011-10-06 MED ORDER — HYDRALAZINE HCL 25 MG PO TABS
25.0000 mg | ORAL_TABLET | Freq: Two times a day (BID) | ORAL | Status: DC
  Administered 2011-10-06 – 2011-10-09 (×6): 25 mg via ORAL
  Filled 2011-10-06 (×9): qty 1

## 2011-10-06 MED ORDER — METOPROLOL TARTRATE 25 MG PO TABS
25.0000 mg | ORAL_TABLET | Freq: Two times a day (BID) | ORAL | Status: DC
  Administered 2011-10-06 – 2011-10-08 (×3): 25 mg via ORAL
  Filled 2011-10-06 (×9): qty 1

## 2011-10-06 MED ORDER — WARFARIN SODIUM 7.5 MG PO TABS
7.5000 mg | ORAL_TABLET | Freq: Once | ORAL | Status: AC
  Administered 2011-10-06: 7.5 mg via ORAL
  Filled 2011-10-06: qty 1

## 2011-10-06 MED ORDER — ALENDRONATE SODIUM 70 MG PO TABS: 70.0000 mg | ORAL_TABLET | ORAL | Status: DC

## 2011-10-06 MED ORDER — ALBUTEROL SULFATE (5 MG/ML) 0.5% IN NEBU: 2.5000 mg | INHALATION_SOLUTION | RESPIRATORY_TRACT | Status: DC | PRN

## 2011-10-06 MED ORDER — METFORMIN HCL 500 MG PO TABS
500.0000 mg | ORAL_TABLET | Freq: Two times a day (BID) | ORAL | Status: DC
  Administered 2011-10-06 – 2011-10-07 (×2): 500 mg via ORAL
  Filled 2011-10-06 (×4): qty 1

## 2011-10-06 MED ORDER — SODIUM CHLORIDE 0.9 % IV BOLUS (SEPSIS)
1000.0000 mL | Freq: Once | INTRAVENOUS | Status: AC
  Administered 2011-10-06: 1000 mL via INTRAVENOUS

## 2011-10-06 MED ORDER — ASPIRIN EC 81 MG PO TBEC
81.0000 mg | DELAYED_RELEASE_TABLET | Freq: Every day | ORAL | Status: DC
  Administered 2011-10-06 – 2011-10-09 (×4): 81 mg via ORAL
  Filled 2011-10-06 (×5): qty 1

## 2011-10-06 MED ORDER — GUAIFENESIN-DM 100-10 MG/5ML PO SYRP: 5.0000 mL | ORAL_SOLUTION | ORAL | Status: DC | PRN

## 2011-10-06 MED ORDER — BUPROPION HCL 100 MG PO TABS
100.0000 mg | ORAL_TABLET | Freq: Two times a day (BID) | ORAL | Status: DC
  Administered 2011-10-06 – 2011-10-09 (×6): 100 mg via ORAL
  Filled 2011-10-06 (×9): qty 1

## 2011-10-06 MED ORDER — SODIUM CHLORIDE 0.9 % IV SOLN
INTRAVENOUS | Status: AC
  Administered 2011-10-06: 50 mL/h via INTRAVENOUS

## 2011-10-06 MED ORDER — AZITHROMYCIN 250 MG PO TABS
250.0000 mg | ORAL_TABLET | Freq: Every day | ORAL | Status: AC
  Administered 2011-10-06 – 2011-10-09 (×4): 250 mg via ORAL
  Filled 2011-10-06 (×4): qty 1

## 2011-10-06 MED ORDER — HYDROCODONE-ACETAMINOPHEN 5-325 MG PO TABS
1.0000 | ORAL_TABLET | ORAL | Status: DC | PRN
  Administered 2011-10-06 – 2011-10-09 (×3): 1 via ORAL
  Filled 2011-10-06 (×4): qty 1

## 2011-10-06 MED ORDER — ONDANSETRON HCL 4 MG/2ML IJ SOLN
4.0000 mg | Freq: Once | INTRAMUSCULAR | Status: AC
  Administered 2011-10-06: 4 mg via INTRAVENOUS
  Filled 2011-10-06: qty 2

## 2011-10-06 MED ORDER — WARFARIN SODIUM 5 MG PO TABS
5.0000 mg | ORAL_TABLET | Freq: Every day | ORAL | Status: DC
Start: 2011-10-07 — End: 2011-10-07
  Filled 2011-10-06: qty 1

## 2011-10-06 MED ORDER — MECLIZINE HCL 25 MG PO TABS
25.0000 mg | ORAL_TABLET | Freq: Four times a day (QID) | ORAL | Status: DC | PRN
  Filled 2011-10-06: qty 1

## 2011-10-06 MED ORDER — ALPRAZOLAM 0.25 MG PO TABS
0.2500 mg | ORAL_TABLET | Freq: Two times a day (BID) | ORAL | Status: DC | PRN
  Administered 2011-10-08 (×2): 0.25 mg via ORAL
  Filled 2011-10-06 (×2): qty 1

## 2011-10-06 MED ORDER — TRAMADOL HCL 50 MG PO TABS
ORAL_TABLET | ORAL | Status: AC
  Filled 2011-10-06: qty 1

## 2011-10-06 MED ORDER — MECLIZINE HCL 25 MG PO TABS
25.0000 mg | ORAL_TABLET | Freq: Once | ORAL | Status: AC
  Administered 2011-10-06: 25 mg via ORAL
  Filled 2011-10-06: qty 1

## 2011-10-06 MED ORDER — ONDANSETRON HCL 4 MG PO TABS
4.0000 mg | ORAL_TABLET | Freq: Four times a day (QID) | ORAL | Status: DC | PRN
  Filled 2011-10-06: qty 1

## 2011-10-06 MED ORDER — OSELTAMIVIR PHOSPHATE 75 MG PO CAPS
75.0000 mg | ORAL_CAPSULE | Freq: Two times a day (BID) | ORAL | Status: DC
  Administered 2011-10-06 – 2011-10-09 (×6): 75 mg via ORAL
  Filled 2011-10-06 (×11): qty 1

## 2011-10-06 MED ORDER — INSULIN ASPART 100 UNIT/ML ~~LOC~~ SOLN
0.0000 [IU] | Freq: Three times a day (TID) | SUBCUTANEOUS | Status: DC
  Administered 2011-10-07: 1 [IU] via SUBCUTANEOUS
  Administered 2011-10-08 – 2011-10-09 (×2): 2 [IU] via SUBCUTANEOUS
  Filled 2011-10-06: qty 3

## 2011-10-06 MED ORDER — MUPIROCIN 2 % EX OINT
1.0000 "application " | TOPICAL_OINTMENT | Freq: Three times a day (TID) | CUTANEOUS | Status: DC
  Administered 2011-10-06 – 2011-10-09 (×8): 1 via TOPICAL
  Filled 2011-10-06 (×2): qty 22

## 2011-10-06 MED ORDER — ADULT MULTIVITAMIN W/MINERALS CH
1.0000 | ORAL_TABLET | Freq: Every day | ORAL | Status: DC
  Administered 2011-10-06 – 2011-10-09 (×4): 1 via ORAL
  Filled 2011-10-06 (×5): qty 1

## 2011-10-06 MED ORDER — WARFARIN SODIUM 6 MG PO TABS
7.0000 mg | ORAL_TABLET | Freq: Once | ORAL | Status: DC
  Filled 2011-10-06: qty 1

## 2011-10-06 MED ORDER — SIMVASTATIN 20 MG PO TABS
20.0000 mg | ORAL_TABLET | Freq: Every day | ORAL | Status: DC
  Administered 2011-10-07 – 2011-10-09 (×3): 20 mg via ORAL
  Filled 2011-10-06 (×5): qty 1

## 2011-10-06 MED ORDER — ONDANSETRON HCL 4 MG/2ML IJ SOLN: 4.0000 mg | Freq: Four times a day (QID) | INTRAMUSCULAR | Status: DC | PRN

## 2011-10-06 MED ORDER — TRAMADOL HCL 50 MG PO TABS
50.0000 mg | ORAL_TABLET | Freq: Once | ORAL | Status: AC
  Administered 2011-10-06: 50 mg via ORAL

## 2011-10-06 NOTE — ED Notes (Addendum)
Patient and family aware of need for urine specimen. Patient unable to void at this time. Patient stated that she voided before arrival and has not had anything to drink. Patient given female urinal.

## 2011-10-06 NOTE — H&P (Signed)
[name] [id number]  Outpatient Primary MD for the patient is No primary provider on file.  With History of -  Past Medical History  Diagnosis Date  . Hypertension   . Diabetes mellitus   . Hypercholesteremia   . Mechanical heart valve present   . Chronic anticoagulation       Past Surgical History  Procedure Date  . Aortic valve replacement     in for   Chief Complaint  Patient presents with  . Weakness    Pt was seen by PCP on yesterday and diagnosed with flu and bacterial sinus infection. pt began vomiting last night and c/o generalized weakness today.      HPI  [name]  is a 70 y.o. female, pt who was diagnosed with Flu by PCP yesterday along with bacterial sinusitis and BPV was placed on Z pack but no Tamiflu, comes in for ++ N&V and vertigo, in ER hyponatremic, poor balance, CXR and head CT stable.   All other ROS -ve.    Review of Systems    In addition to the HPI above,   No Fever-chills, No Headache, No changes with Vision or hearing, No problems swallowing food or Liquids, No Chest pain, Cough or Shortness of Breath, No Abdominal pain, ++ Nausea or Vommitting, Bowel movements are regular, + vertigo No Blood in stool or Urine, No dysuria, No new skin rashes or bruises, No new joints pains-aches,  No new weakness, tingling, numbness in any extremity, No recent weight gain or loss, No polyuria, polydypsia or polyphagia, No significant Mental Stressors.  A full 10 point Review of Systems was done, except as stated above, all other Review of Systems were negative.   Social History History  Substance Use Topics  . Smoking status: Former Research scientist (life sciences)  . Smokeless tobacco: Not on file  . Alcohol Use: No      Family History No CAD  Prior to Admission medications   Medication Sig Start Date End Date Taking? Authorizing Provider  alendronate (FOSAMAX) 70 MG tablet Take 70 mg by mouth every 7 (seven) days. On Saturdays. Take with  a full glass of water on an empty stomach.    Yes Historical Provider, MD  ALPRAZolam Duanne Moron) 0.5 MG tablet Take 0.25 mg by mouth 2 (two) times daily as needed. For anxiety    Yes Historical Provider, MD  aspirin EC 81 MG tablet Take 81 mg by mouth daily. Patient only takes on Mon Wed and Friday   Yes Historical Provider, MD  atorvastatin (LIPITOR) 80 MG tablet Take 80 mg by mouth daily.     Yes Historical Provider, MD  azithromycin (ZITHROMAX) 250 MG tablet Take 250 mg by mouth daily. 5 day course.    Yes Historical Provider, MD  buPROPion (WELLBUTRIN) 100 MG tablet Take 100 mg by mouth 2 (two) times daily.     Yes Historical Provider, MD  Flaxseed, Linseed, (FLAX SEED OIL PO) Take 1 capsule by mouth daily.     Yes Historical Provider, MD  furosemide (LASIX) 20 MG tablet Take 20 mg by mouth 2 (two) times daily.     Yes Historical Provider, MD  hydrALAZINE (APRESOLINE) 50 MG tablet Take 50 mg by mouth 2 (two) times daily.     Yes Historical Provider, MD  metFORMIN (GLUCOPHAGE) 500 MG tablet Take 500 mg by mouth 2 (two) times daily with a meal.     Yes Historical Provider, MD  metoprolol (LOPRESSOR) 50 MG tablet Take 25 mg by  mouth 2 (two) times daily.     Yes Historical Provider, MD  Multiple Vitamin (MULITIVITAMIN WITH MINERALS) TABS Take 1 tablet by mouth daily.     Yes Historical Provider, MD  mupirocin ointment (BACTROBAN) 2 % Apply 1 application topically 3 (three) times daily.     Yes Historical Provider, MD  ondansetron (ZOFRAN-ODT) 4 MG disintegrating tablet Take 4 mg by mouth every 8 (eight) hours as needed. For nausea and vomiting    Yes Historical Provider, MD  vitamin B-12 (CYANOCOBALAMIN) 100 MCG tablet Take 100 mcg by mouth daily.     Yes Historical Provider, MD  warfarin (COUMADIN) 5 MG tablet Take 5 mg by mouth daily.     Yes Historical Provider, MD    Allergies  Allergen Reactions  . Zetia (Ezetimibe) Other (See Comments)    Reaction=unknown  . Zocor (Simvastatin - High Dose)       Physical Exam No intake or output data in the 24 hours ending 10/06/11 1713 Blood pressure 160/38, pulse 55, temperature 98.6 F (37 C), temperature source Oral, resp. rate 17, SpO2 98.00%.   1. General elderly white female lying in bed in NAD,     2. Normal affect and insight, Not Suicidal or Homicidal, Awake Alert, Oriented *3.  3. No F.N deficits, ALL C.Nerves Intact, Strength 5/5 all 4 extremities, Sensation intact all 4 extremities, Plantars down going.  4. Ears and Eyes appear Normal, Conjunctivae clear, PERRLA. Moist Oral Mucosa.Rt Ear drum ? Mild fluid behind it.  5. Supple Neck, No JVD, No cervical lymphadenopathy appriciated, No Carotid Bruits.  6. Symmetrical Chest wall movement, Good air movement bilaterally, CTAB.  7. RRR, No Gallops, Rubs or Murmurs, No Parasternal Heave.  8. Positive Bowel Sounds, Abdomen Soft, Non tender, No organomegaly appriciated,       No rebound -guarding or rigidity.  9.  No Cyanosis, Normal Skin Turgor, No Skin Rash or Bruise.  10. Good muscle tone,  joints appear normal , no effusions, Normal ROM.  11. No Palpable Lymph Nodes in Neck or Axillae    Data Review  CBC  Lab 10/06/11 1559 10/06/11 1140  WBC -- 6.5  HGB 9.9* 11.6*  HCT 29.0* 31.8*  PLT -- 224  MCV -- 85.7  MCH -- 31.3  MCHC -- 36.5*  RDW -- 12.2  LYMPHSABS -- 0.9  MONOABS -- 0.3  EOSABS -- 0.0  BASOSABS -- 0.0  BANDABS -- --   ------------------------------------------------------------------------------------------------------------------ Chemistries   Lab 10/06/11 1559 10/06/11 1140  NA 133* 124*  K 3.8 4.1  CL 100 90*  CO2 -- 24  GLUCOSE 87 158*  BUN 10 13  CREATININE 0.60 0.64  CALCIUM -- 9.4  MG -- --  AST -- 21  ALT -- 20  ALKPHOS -- 54  BILITOT -- 0.5   ------------------------------------------------------------------------------------------------------------------ CrCl is unknown because there is no height on file for the current  visit. ------------------------------------------------------------------------------------------------------------------ No results found for this basename: TSH,T4TOTAL,FREET3,T3FREE,THYROIDAB in the last 72 hours  Coagulation profile  Lab 10/06/11 1140  INR 2.13*  PROTIME --   ------------------------------------------------------------------------------------------------------------------- No results found for this basename: DDIMER:2 in the last 72 hours ------------------------------------------------------------------------------------------------------------------- Cardiac Enzymes No results found for this basename: CK:3,CKMB:3,TROPONINI:3,MYOGLOBIN:3 in the last 168 hours ------------------------------------------------------------------------------------------------------------------ No components found with this basename: POCBNP:3 ------------------------------------------------------------------------------------------------------------------  Imaging results:   Dg Chest 2 View  10/06/2011  *RADIOLOGY REPORT*  Clinical Data: Weakness.  CHEST - 2 VIEW  Comparison: 11/09/2004  Findings: Chronic cardiomegaly, unchanged.  Pulmonary vascularity is  normal and the lungs are clear of acute infiltrates and effusions.  Calcified granulomas in the right midzone and in the left base with slight scarring at the left midzone, unchanged. Evidence of prior CABG and aortic valve replacement.  No acute osseous abnormality.  IMPRESSION: No acute disease.  Chronic cardiomegaly.  Original Report Authenticated By: Larey Seat, M.D.   Ct Head Wo Contrast  10/06/2011  *RADIOLOGY REPORT*  Clinical Data: Generalized weakness  CT HEAD WITHOUT CONTRAST  Technique:  Contiguous axial images were obtained from the base of the skull through the vertex without contrast.  Comparison: None.  Findings: The patient does not show accelerated atrophy.  No sign of old or acute infarction, mass lesion, hemorrhage,  hydrocephalus or extra-axial collection.  Sinuses are clear.  No calvarial abnormality.  IMPRESSION: Normal head CT for a person of this age.  Original Report Authenticated By: Jules Schick, M.D.      My personal review of EKG: Rhythm SinusRate  57 /min, QTc 436 , no Acute ST changes    Assessment & Plan  1. BPV - due to rt Inner ear problem from influ URI + Sinusitis - admit, meclizine, zofran, IVF, Tamiflu-Zithro, PT-OT. Fall precautions  2. DM-2 - A1c, home meds, ISS.  3. CAD with [facility] consult for coumadin, tele monitor, home meds.  4. Hyponatremia - due to N&V from #1, as #1, gentle IVF, check Ur and Ser Osm, Ur Na.  5. Hypertension & Dyslipidemia - Home emds  DVT Prophylaxis Coumadin  AM Labs Ordered, also please review Full Orders  Admission, patients condition and plan of care including tests being ordered have been discussed with the patient and family who indicate understanding and agree with the plan and Code Status.  Code Status Full  Condition Jill Side K M.D on 10/06/2011 at 5:13 PM  [facility] Office  [phone]

## 2011-10-06 NOTE — Progress Notes (Signed)
PHARMACIST - PHYSICIAN COMMUNICATION  CONCERNING: P&T Medication Policy Regarding Oral Bisphosphonates  RECOMMENDATION: Your order for alendronate (Fosamax) has been discontinued at this time.  If the patient's post-hospital medical condition warrants safe use of this class of drugs, please resume the pre-hospital regimen upon discharge.  DESCRIPTION:  Alendronate (Fosamax), ibandronate (Boniva), and risedronate (Actonel) can cause severe esophageal erosions in patients who are unable to remain upright at least 30 minutes after taking this medication.   Since brief interruptions in therapy are thought to have minimal impact on bone mineral density, the [facility] has established that bisphosphonate orders should be routinely discontinued during hospitalization.   To override this safety policy and permit administration of Boniva, Fosamax, or Actonel in the hospital, prescribers must write "DO NOT HOLD" in the comments section when placing the order for this class of medications.  Thank You.  Verdia Kuba, PharmD 10/06/2011 6:38 PM

## 2011-10-06 NOTE — ED Notes (Signed)
[id number] Expected date:10/06/11<BR> Expected time:10:52 AM<BR> Means of arrival:Ambulance<BR> Comments:<BR> EMS 69 GC, 73 yof influenza

## 2011-10-06 NOTE — ED Notes (Signed)
Pt attempted to void. Unable at this time.

## 2011-10-06 NOTE — Progress Notes (Signed)
ANTICOAGULATION CONSULT NOTE - Initial Consult  Pharmacy Consult for Coumadin Indication: Mechanical Aortic Valve  Allergies  Allergen Reactions  . Zetia (Ezetimibe) Other (See Comments)    Reaction=unknown  . Zocor (Simvastatin - High Dose)     Patient Measurements: Height: 5\' 2"  (157.5 cm) Weight: 152 lb (68.947 kg) IBW/kg (Calculated) : 50.1    Vital Signs: Temp: 97.9 F (36.6 C) (12/28 1801) Temp src: Oral (12/28 1801) BP: 193/68 mmHg (12/28 1811) Pulse Rate: 59  (12/28 1801)  Labs:  Basename 10/06/11 1559 10/06/11 1140  HGB 9.9* 11.6*  HCT 29.0* 31.8*  PLT -- 224  APTT -- --  LABPROT -- 24.2*  INR -- 2.13*  HEPARINUNFRC -- --  CREATININE 0.60 0.64  CKTOTAL -- --  CKMB -- --  TROPONINI -- --   Estimated Creatinine Clearance: 57.8 ml/min (by C-G formula based on Cr of 0.6).  Medical History: Past Medical History  Diagnosis Date  . Hypertension   . Diabetes mellitus   . Hypercholesteremia   . Mechanical heart valve present   . Chronic anticoagulation     Medications:  Scheduled:    . alendronate  70 mg Oral Q7 days  . aspirin EC  81 mg Oral Daily  . azithromycin  250 mg Oral Daily  . buPROPion  100 mg Oral BID  . hydrALAZINE  25 mg Oral BID  . insulin aspart  0-9 Units Subcutaneous TID WC  . meclizine  25 mg Oral Once  . meclizine  25 mg Oral Once  . metFORMIN  500 mg Oral BID WC  . metoprolol  25 mg Oral BID  . mulitivitamin with minerals  1 tablet Oral Daily  . mupirocin ointment  1 application Topical TID  . ondansetron (ZOFRAN) IV  4 mg Intravenous Once  . ondansetron (ZOFRAN) IV  4 mg Intravenous Once  . oseltamivir  75 mg Oral BID  . simvastatin  20 mg Oral Daily  . sodium chloride  1,000 mL Intravenous Once  . traMADol      . traMADol  50 mg Oral Once  . warfarin  5 mg Oral Daily  . warfarin  7.5 mg Oral Once  . DISCONTD: warfarin  7 mg Oral Once   Infusions:    . sodium chloride      Assessment: 70 yo admitted with weakness  on chronic coumadin for mechanical aortic valve replacement. Goal of Therapy:  INR 2-3     Plan:  Patient received 7.5mg  Coumdin today @ ~1600. Daily PT/INR. Education.  Lawana Pai R 10/06/2011,6:24 PM

## 2011-10-06 NOTE — ED Provider Notes (Signed)
History     CSN: [id number]  Arrival date & time 10/06/11  1117   First MD Initiated Contact with Patient 10/06/11 1142      Chief Complaint  Patient presents with  . Weakness    Pt was seen by PCP on yesterday and diagnosed with flu and bacterial sinus infection. pt began vomiting last night and c/o generalized weakness today.     (Consider location/radiation/quality/duration/timing/severity/associated sxs/prior treatment) HPI Patient is a 70 year old female who presents today complaining of dizziness as well as lightheadedness, nausea, and vomiting. Patient has had upper respiratory symptoms for the past 3 days. Yesterday the patient was seen by her primary care physician. Patient that time was diagnosed with URI and was started on azithromycin. Patient developed symptoms of a spinning sensation as well as lightheadedness yesterday. Today as she was vomiting she was unable to take her normal home medications. This includes warfarin for aortic valve replacement. Patient denies any abdominal pain. She also notes that her URI symptoms began with a crackling sensation in her ears. Patient has no history of vertigo. There are no other associated or modifying factors.  Past Medical History  Diagnosis Date  . Hypertension   . Diabetes mellitus   . Hypercholesteremia     Past Surgical History  Procedure Date  . Aortic valve replacement     History reviewed. No pertinent family history.  History  Substance Use Topics  . Smoking status: Former Research scientist (life sciences)  . Smokeless tobacco: Not on file  . Alcohol Use: No    OB History    Grav Para Term Preterm Abortions TAB SAB Ect Mult Living                  Review of Systems  Constitutional: Positive for appetite change and fatigue.  HENT: Positive for congestion.         crackling sensation in her ears  Eyes: Negative.   Respiratory: Positive for cough.   Cardiovascular: Negative.   Gastrointestinal: Positive for nausea and vomiting.    Genitourinary: Negative.   Musculoskeletal: Negative.   Skin: Negative.   Neurological: Positive for dizziness, weakness and light-headedness.  Hematological: Negative.   Psychiatric/Behavioral: Negative.   All other systems reviewed and are negative.    Allergies  Zetia and Zocor  Home Medications   Current Outpatient Rx  Name Route Sig Dispense Refill  . ALENDRONATE SODIUM 70 MG PO TABS Oral Take 70 mg by mouth every 7 (seven) days. On Saturdays. Take with a full glass of water on an empty stomach.     . ALPRAZOLAM 0.5 MG PO TABS Oral Take 0.25 mg by mouth 2 (two) times daily as needed. For anxiety     . ASPIRIN EC 81 MG PO TBEC Oral Take 81 mg by mouth daily. Patient only takes on Mon Wed and Friday    . ATORVASTATIN CALCIUM 80 MG PO TABS Oral Take 80 mg by mouth daily.      . AZITHROMYCIN 250 MG PO TABS Oral Take 250 mg by mouth daily. 5 day course.     . BUPROPION HCL 100 MG PO TABS Oral Take 100 mg by mouth 2 (two) times daily.      [name] FLAX SEED OIL PO Oral Take 1 capsule by mouth daily.      . FUROSEMIDE 20 MG PO TABS Oral Take 20 mg by mouth 2 (two) times daily.      [name] HYDRALAZINE HCL 50 MG PO TABS Oral Take 50  mg by mouth 2 (two) times daily.      [name] METFORMIN HCL 500 MG PO TABS Oral Take 500 mg by mouth 2 (two) times daily with a meal.      . METOPROLOL TARTRATE 50 MG PO TABS Oral Take 25 mg by mouth 2 (two) times daily.      . ADULT MULTIVITAMIN W/MINERALS CH Oral Take 1 tablet by mouth daily.      [name] MUPIROCIN 2 % EX OINT Topical Apply 1 application topically 3 (three) times daily.      [name] ONDANSETRON 4 MG PO TBDP Oral Take 4 mg by mouth every 8 (eight) hours as needed. For nausea and vomiting     . VITAMIN B12 100 MCG PO TABS Oral Take 100 mcg by mouth daily.      . WARFARIN SODIUM 5 MG PO TABS Oral Take 5 mg by mouth daily.        BP 160/38  Pulse 55  Temp(Src) 98.6 F (37 C) (Oral)  Resp 17  SpO2 98%  Physical Exam  Nursing note and vitals  reviewed. Constitutional: She is oriented to person, place, and time. She appears well-developed. No distress.       uncomfortable  HENT:  Head: Normocephalic and atraumatic.  Eyes: Conjunctivae and EOM are normal. Pupils are equal, round, and reactive to light.  Neck: Normal range of motion.  Cardiovascular: Normal rate, regular rhythm, normal heart sounds and intact distal pulses.  Exam reveals no gallop and no friction rub.   Pulmonary/Chest: Effort normal and breath sounds normal. No respiratory distress. She has no wheezes. She has no rales.  Abdominal: Soft. Bowel sounds are normal. She exhibits no distension. There is no tenderness. There is no rebound and no guarding.  Musculoskeletal: Normal range of motion. She exhibits no edema and no tenderness.  Neurological: She is alert and oriented to person, place, and time. No cranial nerve deficit. She exhibits normal muscle tone. Coordination normal.       Patient is unable to ambulate as she has unsteady gait. Patient has symptoms with Dix-Hallpike maneuver.  Skin: Skin is warm and dry. No rash noted. There is pallor.    ED Course  Procedures (including critical care time)   Date: 10/06/2011  Rate: 57  Rhythm: sinus bradycardia  QRS Axis: normal  Intervals: normal  ST/T Wave abnormalities: nonspecific ST/T changes  Conduction Disutrbances:none  Narrative Interpretation:   Old EKG Reviewed: unchanged  Labs Reviewed  CBC - Abnormal; Notable for the following:    RBC 3.71 (*)    Hemoglobin 11.6 (*)    HCT 31.8 (*)    MCHC 36.5 (*)    All other components within normal limits  DIFFERENTIAL - Abnormal; Notable for the following:    Neutrophils Relative 81 (*)    All other components within normal limits  COMPREHENSIVE METABOLIC PANEL - Abnormal; Notable for the following:    Sodium 124 (*)    Chloride 90 (*)    Glucose, Bld 158 (*)    GFR calc non Af Amer 87 (*)    All other components within normal limits  URINALYSIS,  ROUTINE W REFLEX MICROSCOPIC - Abnormal; Notable for the following:    Protein, ur 30 (*)    Leukocytes, UA TRACE (*)    All other components within normal limits  PROTIME-INR - Abnormal; Notable for the following:    Prothrombin Time 24.2 (*)    INR 2.13 (*)    All other  components within normal limits  URINE MICROSCOPIC-ADD ON - Abnormal; Notable for the following:    Squamous Epithelial / LPF FEW (*)    All other components within normal limits  POCT I-STAT, CHEM 8 - Abnormal; Notable for the following:    Sodium 133 (*)    Hemoglobin 9.9 (*)    HCT 29.0 (*)    All other components within normal limits  LIPASE, BLOOD  POCT I-STAT TROPONIN I  URINE CULTURE  I-STAT TROPONIN I  I-STAT, CHEM 8   Dg Chest 2 View  10/06/2011  *RADIOLOGY REPORT*  Clinical Data: Weakness.  CHEST - 2 VIEW  Comparison: 11/09/2004  Findings: Chronic cardiomegaly, unchanged.  Pulmonary vascularity is normal and the lungs are clear of acute infiltrates and effusions.  Calcified granulomas in the right midzone and in the left base with slight scarring at the left midzone, unchanged. Evidence of prior CABG and aortic valve replacement.  No acute osseous abnormality.  IMPRESSION: No acute disease.  Chronic cardiomegaly.  Original Report Authenticated By: Larey Seat, M.D.   Ct Head Wo Contrast  10/06/2011  *RADIOLOGY REPORT*  Clinical Data: Generalized weakness  CT HEAD WITHOUT CONTRAST  Technique:  Contiguous axial images were obtained from the base of the skull through the vertex without contrast.  Comparison: None.  Findings: The patient does not show accelerated atrophy.  No sign of old or acute infarction, mass lesion, hemorrhage, hydrocephalus or extra-axial collection.  Sinuses are clear.  No calvarial abnormality.  IMPRESSION: Normal head CT for a person of this age.  Original Report Authenticated By: Jules Schick, M.D.     1. Dizziness   2. Hyponatremia       MDM  Was admitted and evaluated  by myself. Based on presentation patient did have workup for possible pneumonia or UTI. Chest x-ray was clear urinalysis was unremarkable. CBC and renal panel were remarkable mainly for a hyponatremia of 124. Patient was aware that a prior primary care appointment she had a decreased sodium. I spoke to her primary care who reported that she had had a sodium of 130 in September.  Patient had been seen yesterday and started on azithromycin. Given patient's report of dizziness I did perform a head CT. Symptoms have been present for least 24 hours. Given her port or vomiting patient was treated with Zofran as well as normal saline. She received 1 L. She also was treated with meclizine 25 mg once CT returned normal. She was able to tolerate her home Coumadin which was also ordered. When I reassessed the patient she felt better but was unable to ambulate. Dix-Hallpike was performed and patient had worsening again. Repeat i-STAT had shown sodium to be 133. I think this is likely not compared a bowl with CMP. BMP has been ordered at this time. Given the patient is unable to ambulate at her baseline and family is uncomfortable with caring her at home she will be admitted to medicine. Patient was given a second dose of meclizine 25 mg by mouth she had benefit previously. Given patient's recent URI, hearing symptoms, as well as dizziness I think she likely may have a vestibular neuritis.        Chauncy Passy, MD 10/06/11 [phone]

## 2011-10-06 NOTE — ED Notes (Signed)
Patient transported to CT 

## 2011-10-06 NOTE — ED Notes (Signed)
Attempted to call report on pt. Amy, RN to call back.

## 2011-10-07 LAB — BASIC METABOLIC PANEL
BUN: 8 mg/dL (ref 6–23)
Chloride: 102 mEq/L (ref 96–112)
GFR calc Af Amer: >90 mL/min (ref >90)
GFR calc non Af Amer: 85 mL/min — ABNORMAL LOW (ref >90)
Glucose, Bld: 95 mg/dL (ref 70–99)
Potassium: 3.9 mEq/L (ref 3.5–5.1)
Sodium: 133 mEq/L — ABNORMAL LOW (ref 135–145)

## 2011-10-07 LAB — CBC
HCT: 29.6 % — ABNORMAL LOW (ref 36.0–46.0)
Hemoglobin: 10.5 g/dL — ABNORMAL LOW (ref 12.0–15.0)
RBC: 3.38 MIL/uL — ABNORMAL LOW (ref 3.87–5.11)

## 2011-10-07 LAB — URINE CULTURE: Colony Count: 3000

## 2011-10-07 LAB — GLUCOSE, CAPILLARY: Glucose-Capillary: 127 mg/dL — ABNORMAL HIGH (ref 70–99)

## 2011-10-07 LAB — OSMOLALITY, URINE: Osmolality, Ur: 403 mOsm/kg (ref 390–1090)

## 2011-10-07 MED ORDER — WARFARIN SODIUM 7.5 MG PO TABS
7.5000 mg | ORAL_TABLET | Freq: Once | ORAL | Status: AC
  Administered 2011-10-07: 7.5 mg via ORAL
  Filled 2011-10-07: qty 1

## 2011-10-07 NOTE — Progress Notes (Signed)
PROGRESS NOTE  [name] [id number] DOB: 03-02-1941 DOA: 10/06/2011 PCP: Luanne Bras, FNP, FNP  Brief narrative: 70 year old woman seen by primary care provider one day prior to admission and diagnosed with influenza and bacterial sinus infection and placed on Zithromax. Presented to the emergency department with complaints of dizziness, lightheadedness, nausea and vomiting. Found to have hyponatremia. Emergency department physician was concerned for vestibular neuritis.  Past medical history: Aortic valve replacement on warfarin, hypertension, diabetes mellitus, hyperlipidemia  Consultants:  None  Procedures:  None  Antibiotics:  December 28: Zithromax  Interim History: Hypertension poorly controlled.  Subjective: Feels better. Able to drink some liquids. Less nausea and vomiting. Still some vertigo with movement.  Objective: Filed Vitals:   10/07/11 0430 10/07/11 0902 10/07/11 0903 10/07/11 0904  BP: 165/65 156/68 154/71 168/70  Pulse: 52 56 57 72  Temp: 97.2 F (36.2 C) 97.5 F (36.4 C)    TempSrc: Oral Oral    Resp: 17 18    Height:      Weight:      SpO2: 96% 96% 96% 96%    Intake/Output Summary (Last 24 hours) at 10/07/11 1148 Last data filed at 10/07/11 0900  Gross per 24 hour  Intake 985.83 ml  Output   1700 ml  Net -714.17 ml    Exam:  General: Appears calm and comfortable. Cardiovascular: Regular rate and rhythm. No murmur, rub or gallop. No lower extremity edema. Respiratory: Clear to auscultation bilaterally. No wheezes, rales or rhonchi. Normal respiratory effort.  Data Reviewed: Basic Metabolic Panel:  Lab [id number] 0410 10/06/11 1640 10/06/11 1559 10/06/11 1140  NA 133* 127* 133* 124*  K 3.9 3.7 -- --  CL 102 98 100 90*  CO2 25 22 -- 24  GLUCOSE 95 109* 87 158*  BUN 8 11 10 13   CREATININE 0.68 0.61 0.60 0.64  CALCIUM 8.7 8.4 -- 9.4  MG -- -- -- --  PHOS -- -- -- --   Liver Function Tests:  Lab 10/06/11 1140  AST 21    ALT 20  ALKPHOS 54  BILITOT 0.5  PROT 6.7  ALBUMIN 4.0    Lab 10/06/11 1140  LIPASE 22  AMYLASE --   CBC:  Lab 10/07/11 0410 10/06/11 1559 10/06/11 1140  WBC 4.9 -- 6.5  NEUTROABS -- -- 5.3  HGB 10.5* 9.9* 11.6*  HCT 29.6* 29.0* 31.8*  MCV 87.6 -- 85.7  PLT 224 -- 224   CBG:  Lab 10/07/11 0731 10/06/11 2100 10/06/11 1824  GLUCAP 88 155* 90   Studies: Dg Chest 2 View  10/06/2011  *RADIOLOGY REPORT*  Clinical Data: Weakness.  CHEST - 2 VIEW  Comparison: 11/09/2004  Findings: Chronic cardiomegaly, unchanged.  Pulmonary vascularity is normal and the lungs are clear of acute infiltrates and effusions.  Calcified granulomas in the right midzone and in the left base with slight scarring at the left midzone, unchanged. Evidence of prior CABG and aortic valve replacement.  No acute osseous abnormality.  IMPRESSION: No acute disease.  Chronic cardiomegaly.  Original Report Authenticated By: Larey Seat, M.D.   Ct Head Wo Contrast  10/06/2011  *RADIOLOGY REPORT*  Clinical Data: Generalized weakness  CT HEAD WITHOUT CONTRAST  Technique:  Contiguous axial images were obtained from the base of the skull through the vertex without contrast.  Comparison: None.  Findings: The patient does not show accelerated atrophy.  No sign of old or acute infarction, mass lesion, hemorrhage, hydrocephalus or extra-axial collection.  Sinuses are clear.  No  calvarial abnormality.  IMPRESSION: Normal head CT for a person of this age.  Original Report Authenticated By: Jules Schick, M.D.    Scheduled Meds:   . aspirin EC  81 mg Oral Daily  . azithromycin  250 mg Oral Daily  . buPROPion  100 mg Oral BID  . hydrALAZINE  25 mg Oral BID  . insulin aspart  0-9 Units Subcutaneous TID WC  . meclizine  25 mg Oral Once  . meclizine  25 mg Oral Once  . metFORMIN  500 mg Oral BID WC  . metoprolol  25 mg Oral BID  . mulitivitamin with minerals  1 tablet Oral Daily  . mupirocin ointment  1 application  Topical TID  . ondansetron (ZOFRAN) IV  4 mg Intravenous Once  . ondansetron (ZOFRAN) IV  4 mg Intravenous Once  . oseltamivir  75 mg Oral BID  . simvastatin  20 mg Oral Daily  . sodium chloride  1,000 mL Intravenous Once  . traMADol      . traMADol  50 mg Oral Once  . warfarin  5 mg Oral Daily  . warfarin  7.5 mg Oral Once  . DISCONTD: alendronate  70 mg Oral Q7 days  . DISCONTD: warfarin  7 mg Oral Once   Continuous Infusions:   . sodium chloride 50 mL/hr (10/06/11 1844)     Assessment/Plan: 1. Influenza: Tamiflu for 5 days. 2. Bacterial sinusitis: I suspect this is actually bacterial otitis as the patient complains of ear symptoms rather than symptoms referable to her sinuses. Her symptoms of vertigo did begin prior to beginning Zithromax so there is no reason to suspect an adverse drug reaction at this point. 3. Hyponatremia: Etiology unclear. Improved. Not on any obvious offending medications. 4. BPPV: Meclizine. Physical therapy. Antiemetics. Fall precautions. 5. Anemia: Appears to be stable and near baseline. Followup as an outpatient. 6. Diabetes mellitus type 2: Controlled. Capillary blood sugars stable. Hemoglobin A1c 6.4. 7. History of aortic valve replacement on chronic warfarin: INR therapeutic. Continue warfarin per pharmacy. 8. Hypertension: Stable. Continue metoprolol, hydralazine. Negative orthostatics.   Code Status: Full.   Disposition Plan: Home when improved. Possibly tomorrow December 30.   Murray Hodgkins, MD  [facility] Pager [phone] 10/07/2011, 11:48 AM    LOS: 1 day

## 2011-10-07 NOTE — Progress Notes (Signed)
ANTICOAGULATION CONSULT NOTE - follow up  Pharmacy Consult for Coumadin Indication: Mechanical Aortic Valve  Allergies  Allergen Reactions  . Zetia (Ezetimibe) Other (See Comments)    Reaction=unknown  . Zocor (Simvastatin - High Dose)     Patient Measurements: Height: 5\' 2"  (157.5 cm) Weight: 152 lb (68.947 kg) IBW/kg (Calculated) : 50.1    Vital Signs: Temp: 97.5 F (36.4 C) (12/29 0902) Temp src: Oral (12/29 0902) BP: 168/70 mmHg (12/29 0904) Pulse Rate: 72  (12/29 0904)  Labs:  Basename 10/07/11 0410 10/06/11 1640 10/06/11 1559 10/06/11 1140  HGB 10.5* -- 9.9* --  HCT 29.6* -- 29.0* 31.8*  PLT 224 -- -- 224  APTT -- -- -- --  LABPROT 23.2* -- -- 24.2*  INR 2.02* -- -- 2.13*  HEPARINUNFRC -- -- -- --  CREATININE 0.68 0.61 0.60 --  CKTOTAL -- -- -- --  CKMB -- -- -- --  TROPONINI -- -- -- --   Estimated Creatinine Clearance: 57.8 ml/min (by C-G formula based on Cr of 0.68).  Medical History: Past Medical History  Diagnosis Date  . Hypertension   . Diabetes mellitus   . Hypercholesteremia   . Mechanical heart valve present   . Chronic anticoagulation     Medications:  Scheduled:     . aspirin EC  81 mg Oral Daily  . azithromycin  250 mg Oral Daily  . buPROPion  100 mg Oral BID  . hydrALAZINE  25 mg Oral BID  . insulin aspart  0-9 Units Subcutaneous TID WC  . meclizine  25 mg Oral Once  . meclizine  25 mg Oral Once  . metFORMIN  500 mg Oral BID WC  . metoprolol  25 mg Oral BID  . mulitivitamin with minerals  1 tablet Oral Daily  . mupirocin ointment  1 application Topical TID  . ondansetron (ZOFRAN) IV  4 mg Intravenous Once  . ondansetron (ZOFRAN) IV  4 mg Intravenous Once  . oseltamivir  75 mg Oral BID  . simvastatin  20 mg Oral Daily  . sodium chloride  1,000 mL Intravenous Once  . traMADol      . traMADol  50 mg Oral Once  . warfarin  5 mg Oral Daily  . warfarin  7.5 mg Oral Once  . DISCONTD: alendronate  70 mg Oral Q7 days  . DISCONTD:  warfarin  7 mg Oral Once   Infusions:     . sodium chloride 50 mL/hr (10/06/11 1844)    Assessment: 70 yo admitted with weakness on chronic coumadin(5mg  daily) for mechanical aortic valve replacement. INR therapeutic but lower than yest after 7.5mg .  Azithromycin and Tamiflu for URI.  Goal of Therapy:  INR 2-3    Plan:  Will give 7.5mg  again today. Daily PT/INR.  Romeo Rabon, PharmD, pager [phone]. 10/07/2011,11:54 AM.

## 2011-10-07 NOTE — Progress Notes (Signed)
OT Cancellation Note  ___Treatment cancelled today due to medical issues with patient which prohibited therapy  ___ Treatment cancelled today due to patient receiving procedure or test   ___ Treatment cancelled today due to patient's refusal to participate   __x_ Treatment cancelled today due to pt not feeling up to OT vestibular eval this afternoon.  RN states she got up to 3:1 a couple of times today.  Will check back tomorrow am.      Lesle Chris, OTR/L [id number] 10/07/2011

## 2011-10-08 LAB — GLUCOSE, CAPILLARY
Glucose-Capillary: 88 mg/dL (ref 70–99)
Glucose-Capillary: 163 mg/dL — ABNORMAL HIGH (ref 70–99)
Glucose-Capillary: 107 mg/dL — ABNORMAL HIGH (ref 70–99)

## 2011-10-08 LAB — PROTIME-INR
INR: 2.87 — ABNORMAL HIGH (ref 0.00–1.49)
Prothrombin Time: 30.5 seconds — ABNORMAL HIGH (ref 11.6–15.2)

## 2011-10-08 MED ORDER — ACETAMINOPHEN 325 MG PO TABS
650.0000 mg | ORAL_TABLET | Freq: Once | ORAL | Status: AC
  Administered 2011-10-08: 650 mg via ORAL

## 2011-10-08 MED ORDER — ACETAMINOPHEN 325 MG PO TABS
ORAL_TABLET | ORAL | Status: AC
  Administered 2011-10-08: 650 mg via ORAL
  Filled 2011-10-08: qty 2

## 2011-10-08 MED ORDER — ACETAMINOPHEN 325 MG PO TABS: 650.0000 mg | ORAL_TABLET | Freq: Four times a day (QID) | ORAL | Status: DC | PRN

## 2011-10-08 NOTE — Progress Notes (Signed)
PROGRESS NOTE  [name] [id number] DOB: 1941-05-30 DOA: 10/06/2011 PCP: Luanne Bras, FNP, FNP  Brief narrative: 70 year old woman seen by primary care provider one day prior to admission and diagnosed with influenza and bacterial sinus infection and placed on Zithromax. Presented to the emergency department with complaints of dizziness, lightheadedness, nausea and vomiting. Found to have hyponatremia. Emergency department physician was concerned for vestibular neuritis.  Past medical history: Aortic valve replacement on warfarin, hypertension, diabetes mellitus, hyperlipidemia, hyponatremia  Consultants:  Occupational therapy: Home health with 24-hour assistance versus skilled nursing facility.  Procedures:  None  Antibiotics:  December 28: Zithromax  Interim History: Hypertension labile. Subjective: Feels better. Reports getting up this morning without difficulty. Had significant nausea after she began to work with occupational therapy.  Objective: Filed Vitals:   10/07/11 2128 10/07/11 2311 10/08/11 0058 10/08/11 0541  BP: 167/69 181/62 187/73 172/66  Pulse: 54 59 59 58  Temp: 98 F (36.7 C)   98 F (36.7 C)  TempSrc: Oral   Oral  Resp: 20  18 20   Height:      Weight:    69.536 kg (153 lb 4.8 oz)  SpO2: 97%   96%    Intake/Output Summary (Last 24 hours) at 10/08/11 1454 Last data filed at 10/08/11 1100  Gross per 24 hour  Intake    840 ml  Output   2050 ml  Net  -1210 ml    Exam:  General: Appears calm and comfortable. Cardiovascular: Regular rate and rhythm. No murmur, rub or gallop. No lower extremity edema. Respiratory: Clear to auscultation bilaterally. No wheezes, rales or rhonchi. Normal respiratory effort.  Data Reviewed: Basic Metabolic Panel:  Lab [id number] 0410 10/06/11 1640 10/06/11 1559 10/06/11 1140  NA 133* 127* 133* 124*  K 3.9 3.7 -- --  CL 102 98 100 90*  CO2 25 22 -- 24  GLUCOSE 95 109* 87 158*  BUN 8 11 10 13   CREATININE  0.68 0.61 0.60 0.64  CALCIUM 8.7 8.4 -- 9.4  MG -- -- -- --  PHOS -- -- -- --   Liver Function Tests:  Lab 10/06/11 1140  AST 21  ALT 20  ALKPHOS 54  BILITOT 0.5  PROT 6.7  ALBUMIN 4.0    Lab 10/06/11 1140  LIPASE 22  AMYLASE --   CBC:  Lab 10/07/11 0410 10/06/11 1559 10/06/11 1140  WBC 4.9 -- 6.5  NEUTROABS -- -- 5.3  HGB 10.5* 9.9* 11.6*  HCT 29.6* 29.0* 31.8*  MCV 87.6 -- 85.7  PLT 224 -- 224   CBG:  Lab 10/08/11 1205 10/08/11 0720 10/07/11 2040 10/07/11 1700 10/07/11 1202  GLUCAP 163* 88 108* 127* 87   Studies: Dg Chest 2 View  10/06/2011  *RADIOLOGY REPORT*  Clinical Data: Weakness.  CHEST - 2 VIEW  Comparison: 11/09/2004  Findings: Chronic cardiomegaly, unchanged.  Pulmonary vascularity is normal and the lungs are clear of acute infiltrates and effusions.  Calcified granulomas in the right midzone and in the left base with slight scarring at the left midzone, unchanged. Evidence of prior CABG and aortic valve replacement.  No acute osseous abnormality.  IMPRESSION: No acute disease.  Chronic cardiomegaly.  Original Report Authenticated By: Larey Seat, M.D.   Ct Head Wo Contrast  10/06/2011  *RADIOLOGY REPORT*  Clinical Data: Generalized weakness  CT HEAD WITHOUT CONTRAST  Technique:  Contiguous axial images were obtained from the base of the skull through the vertex without contrast.  Comparison: None.  Findings: The  patient does not show accelerated atrophy.  No sign of old or acute infarction, mass lesion, hemorrhage, hydrocephalus or extra-axial collection.  Sinuses are clear.  No calvarial abnormality.  IMPRESSION: Normal head CT for a person of this age.  Original Report Authenticated By: Jules Schick, M.D.    Scheduled Meds:    . acetaminophen  650 mg Oral Once  . aspirin EC  81 mg Oral Daily  . azithromycin  250 mg Oral Daily  . buPROPion  100 mg Oral BID  . hydrALAZINE  25 mg Oral BID  . insulin aspart  0-9 Units Subcutaneous TID WC  .  metoprolol  25 mg Oral BID  . mulitivitamin with minerals  1 tablet Oral Daily  . mupirocin ointment  1 application Topical TID  . oseltamivir  75 mg Oral BID  . simvastatin  20 mg Oral Daily  . warfarin  7.5 mg Oral ONCE-1800   Continuous Infusions:    Assessment/Plan: 1. Influenza: Tamiflu for 5 days. 2. Bacterial sinusitis: I suspect this is actually bacterial otitis as the patient complains of ear symptoms rather than symptoms referable to her sinuses. Her symptoms of vertigo did begin prior to beginning Zithromax so there is no reason to suspect an adverse drug reaction at this point. 3. Hyponatremia: Etiology unclear. Improved. Not on any obvious offending medications. She reports that this was noted several months ago by her primary care provider. This can be further evaluated in the outpatient setting. 4. BPPV: Meclizine. Appreciate therapy recommendations. Antiemetics. Fall precautions. 5. Anemia: Appears to be stable and near baseline. Followup as an outpatient. 6. Diabetes mellitus type 2: Controlled. Capillary blood sugars stable. Hemoglobin A1c 6.4. 7. History of aortic valve replacement on chronic warfarin: INR therapeutic. Continue warfarin per pharmacy. 8. Hypertension: Stable. Continue metoprolol, hydralazine. Negative orthostatics.   Code Status: Full.   Disposition Plan: Will discuss with patient. Less I spoke with her she wanted to go home.   Murray Hodgkins, MD  [facility] Pager [phone] 10/08/2011, 2:54 PM    LOS: 2 days

## 2011-10-08 NOTE — Progress Notes (Signed)
ANTICOAGULATION CONSULT NOTE - follow up  Pharmacy Consult for Coumadin Indication: Mechanical Aortic Valve  Allergies  Allergen Reactions  . Zetia (Ezetimibe) Other (See Comments)    Reaction=unknown  . Zocor (Simvastatin - High Dose)     Patient Measurements: Height: 5\' 2"  (157.5 cm) Weight: 153 lb 4.8 oz (69.536 kg) IBW/kg (Calculated) : 50.1    Vital Signs: Temp: 98 F (36.7 C) (12/30 0541) Temp src: Oral (12/30 0541) BP: 172/66 mmHg (12/30 0541) Pulse Rate: 58  (12/30 0541)  Labs:  Basename 10/08/11 0500 10/07/11 0410 10/06/11 1640 10/06/11 1559 10/06/11 1140  HGB -- 10.5* -- 9.9* --  HCT -- 29.6* -- 29.0* 31.8*  PLT -- 224 -- -- 224  APTT -- -- -- -- --  LABPROT 30.5* 23.2* -- -- 24.2*  INR 2.87* 2.02* -- -- 2.13*  HEPARINUNFRC -- -- -- -- --  CREATININE -- 0.68 0.61 0.60 --  CKTOTAL -- -- -- -- --  CKMB -- -- -- -- --  TROPONINI -- -- -- -- --   Estimated Creatinine Clearance: 58.1 ml/min (by C-G formula based on Cr of 0.68).  Medical History: Past Medical History  Diagnosis Date  . Hypertension   . Diabetes mellitus   . Hypercholesteremia   . Mechanical heart valve present   . Chronic anticoagulation     Medications:  Scheduled:     . acetaminophen  650 mg Oral Once  . aspirin EC  81 mg Oral Daily  . azithromycin  250 mg Oral Daily  . buPROPion  100 mg Oral BID  . hydrALAZINE  25 mg Oral BID  . insulin aspart  0-9 Units Subcutaneous TID WC  . metoprolol  25 mg Oral BID  . mulitivitamin with minerals  1 tablet Oral Daily  . mupirocin ointment  1 application Topical TID  . oseltamivir  75 mg Oral BID  . simvastatin  20 mg Oral Daily  . warfarin  7.5 mg Oral ONCE-1800  . DISCONTD: metFORMIN  500 mg Oral BID WC  . DISCONTD: warfarin  5 mg Oral Daily   Infusions:     Assessment: 70 yo admitted with weakness on chronic coumadin(5mg  daily) for mechanical aortic valve replacement. INR therapeutic but rose significantly overnight.  Still  with N/V. No bleeding reported/documented. Azithromycin and Tamiflu continue for URI.  Goal of Therapy:  INR 2-3    Plan:  Will hold warfarin today and f/u INR in am.  Romeo Rabon, PharmD, pager [phone]. 10/08/2011,10:38 AM.

## 2011-10-08 NOTE — Progress Notes (Signed)
Occupational Therapy Evaluation Patient Details Name: [name] MRN: [id number] DOB: [birth year] Today's Date: 10/08/2011  Problem List:  Patient Active Problem List  Diagnoses  . Benign positional vertigo  . Hypertension  . Hypercholesteremia  . Mechanical heart valve present  . Chronic anticoagulation  . Hyponatremia  . Influenza  . Sinusitis    Past Medical History:  Past Medical History  Diagnosis Date  . Hypertension   . Diabetes mellitus   . Hypercholesteremia   . Mechanical heart valve present   . Chronic anticoagulation    Past Surgical History:  Past Surgical History  Procedure Date  . Aortic valve replacement     OT Assessment/Plan/Recommendation   OT Goals Acute Rehab OT Goals OT Goal Formulation: With patient Miscellaneous OT Goals Miscellaneous OT Goal #1: pt will be independent with brandt daroff exercise for R BPPV OT Goal: Miscellaneous Goal #1 - Progress: Not met  OT Evaluation Precautions/Restrictions  Restrictions Weight Bearing Restrictions: No Prior Functioning     ADL ADL ADL Comments: order for vestibular eval only:  see [place] chart Vision/Perception    Cognition   Sensation/Coordination   Extremity Assessment   Mobility    Exercises   End of Session     [name] 10/08/2011, 10:59 AM [phone]

## 2011-10-08 NOTE — Progress Notes (Signed)
OT Note:  Completed limited vestibular eval which will be placed in shadow chart.  Pt had positive R BPPV when placed in modified [employer] position.  Pt nauseated after test so Epley/CRT not performed at this time.  I will try to return in an hour to see if we can work through maneuver.  Noted pt has flu/sinus problems at this time also.  Pt was guarding neck, unable to assess VOR to see if hypofunction also exists.  Pt does live alone.  She transferred to 3:1 with min guard but may need someone to stay with her vs. STSNF if symptoms don't resolve.  May want to consider PT/OT consults for mobility and ADLs.  Thank you.  [place], [place] [phone] 10/08/2011

## 2011-10-08 NOTE — Progress Notes (Signed)
Occupational Therapy Treatment Patient Details Name: [name] MRN: [id number] DOB: 1941-06-22 Today's Date: 10/08/2011  OT Assessment/Plan OT Assessment/Plan OT Plan: Other (comment) (home with 24/7 vs. stsnf unless pt mod I) OT Frequency: Min 2X/week Follow Up Recommendations: Skilled nursing facility vs;Home health OT;24 hour supervision/assistance Equipment Recommended: Other (comment) (has walker at home) OT Goals Acute Rehab OT Goals OT Goal Formulation: With patient Miscellaneous OT Goals Miscellaneous OT Goal #1: pt will be independent with brandt daroff exercise for R BPPV OT Goal: Miscellaneous Goal #1 - Progress: Progressing toward goals  OT Treatment Precautions/Restrictions  Precautions Precautions:  (drop) Restrictions Weight Bearing Restrictions: No   ADL ADL ADL Comments: performed Epley/CRT for R BPPV; tolerated well explained brandt-daroff; handout given Mobility  Bed Mobility Bed Mobility: Yes Supine to Sit: 4: Min assist Transfers Transfers: Yes Sit to Stand: 4: Min assist Exercises    End of Session OT - End of Session Equipment Utilized During Treatment:  (hand held assist; min A to walk to recliner) General Behavior During Session: [facility] for tasks performed Cognition: [facility] for tasks performed  Kendricks Reap  [phone] 10/08/2011, 12:11 PM

## 2011-10-09 LAB — GLUCOSE, CAPILLARY: Glucose-Capillary: 168 mg/dL — ABNORMAL HIGH (ref 70–99)

## 2011-10-09 LAB — PROTIME-INR: INR: 2.65 — ABNORMAL HIGH (ref 0.00–1.49)

## 2011-10-09 MED ORDER — WARFARIN SODIUM 5 MG PO TABS
5.0000 mg | ORAL_TABLET | Freq: Once | ORAL | Status: AC
  Administered 2011-10-09: 5 mg via ORAL
  Filled 2011-10-09: qty 1

## 2011-10-09 MED ORDER — MECLIZINE HCL 25 MG PO TABS: 25.0000 mg | ORAL_TABLET | Freq: Three times a day (TID) | ORAL | Status: AC | PRN

## 2011-10-09 MED ORDER — CLONIDINE HCL 0.1 MG PO TABS
0.1000 mg | ORAL_TABLET | Freq: Once | ORAL | Status: AC
  Administered 2011-10-09: 0.1 mg via ORAL
  Filled 2011-10-09: qty 1

## 2011-10-09 MED ORDER — OSELTAMIVIR PHOSPHATE 75 MG PO CAPS: 75.0000 mg | ORAL_CAPSULE | Freq: Two times a day (BID) | ORAL | Status: AC

## 2011-10-09 MED ORDER — SODIUM CHLORIDE 0.9 % IJ SOLN
3.0000 mL | Freq: Two times a day (BID) | INTRAMUSCULAR | Status: DC
  Administered 2011-10-09: 3 mL via INTRAVENOUS

## 2011-10-09 NOTE — Progress Notes (Signed)
Spoke with patient with Dr. Sarajane Jews. Patient adamant that she go home. States she will arrange care with family and friends. Merleen Nicely for [facility] services, contacted Butch Penny to arrange. Awaiting orders to be written.

## 2011-10-09 NOTE — Progress Notes (Signed)
ANTICOAGULATION CONSULT NOTE - follow up  Pharmacy Consult for Coumadin Indication: Mechanical Aortic Valve  Allergies  Allergen Reactions  . Zetia (Ezetimibe) Other (See Comments)    Reaction=unknown  . Zocor (Simvastatin - High Dose)     Patient Measurements: Height: 5\' 2"  (157.5 cm) Weight: 153 lb 4.8 oz (69.536 kg) IBW/kg (Calculated) : 50.1    Vital Signs: Temp: 98 F (36.7 C) (12/31 0723) Temp src: Oral (12/31 0723) BP: 148/68 mmHg (12/31 0912) Pulse Rate: 54  (12/31 0912)  Labs:  Basename 10/09/11 0533 10/08/11 0500 10/07/11 0410 10/06/11 1640 10/06/11 1559 10/06/11 1140  HGB -- -- 10.5* -- 9.9* --  HCT -- -- 29.6* -- 29.0* 31.8*  PLT -- -- 224 -- -- 224  APTT -- -- -- -- -- --  LABPROT 28.7* 30.5* 23.2* -- -- --  INR 2.65* 2.87* 2.02* -- -- --  HEPARINUNFRC -- -- -- -- -- --  CREATININE -- -- 0.68 0.61 0.60 --  CKTOTAL -- -- -- -- -- --  CKMB -- -- -- -- -- --  TROPONINI -- -- -- -- -- --   Estimated Creatinine Clearance: 58.1 ml/min (by C-G formula based on Cr of 0.68).  Medical History: Past Medical History  Diagnosis Date  . Hypertension   . Diabetes mellitus   . Hypercholesteremia   . Mechanical heart valve present   . Chronic anticoagulation     Medications:  Scheduled:     . aspirin EC  81 mg Oral Daily  . azithromycin  250 mg Oral Daily  . buPROPion  100 mg Oral BID  . cloNIDine  0.1 mg Oral Once  . hydrALAZINE  25 mg Oral BID  . insulin aspart  0-9 Units Subcutaneous TID WC  . metoprolol  25 mg Oral BID  . mulitivitamin with minerals  1 tablet Oral Daily  . mupirocin ointment  1 application Topical TID  . oseltamivir  75 mg Oral BID  . simvastatin  20 mg Oral Daily  . sodium chloride  3 mL Intravenous Q12H   Infusions:     Assessment: 70 yo admitted with weakness on chronic coumadin (5mg  daily) for mechanical aortic valve replacement. Pateint received 7.5mg  x 2 12/28 and 12/29. INR rose significantly 12/30 and patient had n/v so  Coumadin dose was held. INR now down some, still within therapeutic range.No bleeding reported/documented. Azithromycin discontinued, still on Tamiflu (6 doses received so far), no flu PCR tests done inpatient (?done outpt).  Goal of Therapy:  INR 2-3    Plan:  Coumadin 5mg  today. F/U AM INR.  Marcell Anger PharmD  [phone] 10/09/2011 11:03 AM

## 2011-10-09 NOTE — Progress Notes (Signed)
Patient discharged via wheelchair to home.  Discharge instructions given to patient and prescriptions verified at her pharmacy are ready for pickup.  No change from am assessment.

## 2011-10-09 NOTE — Progress Notes (Signed)
UR completed 

## 2011-10-09 NOTE — Discharge Summary (Signed)
Physician Discharge Summary  [name] [id number] DOB: November 28, 1940 DOA: 10/06/2011  PCP: [name] Bras, FNP, FNP  Admit date: 10/06/2011 Discharge date: 10/09/2011  Discharge Diagnoses:  1. BPPV 2. Hyponatremia 3. Influenza by outpatient diagnosis 4. Sinusitis by outpatient diagnosis 5. Hyponatremia 6. Anemia, stable 7. Diabetes mellitus type 2, stable 8. History of aortic valve replacement on chronic warfarin, stable  Discharge Condition: Improved  Disposition: Home with home health physical and occupational therapy with vestibular treatment.  History of present illness:  70 year old woman seen by primary care provider one day prior to admission and diagnosed with influenza and bacterial sinus infection and placed on Zithromax. Presented to the emergency department with complaints of dizziness, lightheadedness, nausea and vomiting. Found to have hyponatremia. Emergency department physician was concerned for vestibular neuritis.  Hospital Course:  Ms. [name] was admitted to medical floor. She was seen by occupational therapy for vestibular treatment. She has had marked improvement in her vertigo. Nevertheless skilled rehabilitation was recommended by occupational therapy. The patient declines this and wishes to go home. She states she has friends and family in the area who can help her. She will use her walker at home. She will finish a course of Tamiflu for influenza diagnoses outpatient. She completed a course of Zithromax for sinusitis diagnosis patient. Other issues as below.  1. Hyponatremia: Etiology unclear. Improved. Not on any obvious offending medications. She reports that this was noted several months ago by her primary care provider. This can be further evaluated in the outpatient setting.  2. BPPV: Meclizine. Appreciate therapy recommendations. Antiemetics. Home health physical and occupational therapy/vestibular treatment.  3. Anemia: Appears to be stable and near  baseline. Followup as an outpatient.  4. Diabetes mellitus type 2: Controlled. Capillary blood sugars stable. Hemoglobin A1c 6.4.  5. History of aortic valve replacement on chronic warfarin: INR therapeutic. Continue warfarin per pharmacy.  6. Hypertension: Stable. Continue metoprolol, hydralazine. Negative orthostatics.  Consultants:  Occupational therapy: Home health with 24-hour assistance versus skilled nursing facility.  Procedures:  None  Discharge Instructions  Discharge Orders    Future Orders Please Complete By Expires   Diet - low sodium heart healthy      Increase activity slowly        Current Discharge Medication List    START taking these medications   Details  meclizine (ANTIVERT) 25 MG tablet Take 1 tablet (25 mg total) by mouth 3 (three) times daily as needed for dizziness or nausea. Qty: 20 tablet, Refills: 0    oseltamivir (TAMIFLU) 75 MG capsule Take 1 capsule (75 mg total) by mouth 2 (two) times daily. Qty: 4 capsule, Refills: 0      CONTINUE these medications which have NOT CHANGED   Details  alendronate (FOSAMAX) 70 MG tablet Take 70 mg by mouth every 7 (seven) days. On Saturdays. Take with a full glass of water on an empty stomach.     ALPRAZolam (XANAX) 0.5 MG tablet Take 0.25 mg by mouth 2 (two) times daily as needed. For anxiety     aspirin EC 81 MG tablet Take 81 mg by mouth daily. Patient only takes on Mon Wed and Friday    atorvastatin (LIPITOR) 80 MG tablet Take 80 mg by mouth daily.      buPROPion (WELLBUTRIN) 100 MG tablet Take 100 mg by mouth 2 (two) times daily.      Flaxseed, Linseed, (FLAX SEED OIL PO) Take 1 capsule by mouth daily.      furosemide (LASIX) 20  MG tablet Take 20 mg by mouth 2 (two) times daily.      hydrALAZINE (APRESOLINE) 50 MG tablet Take 50 mg by mouth 2 (two) times daily.      metFORMIN (GLUCOPHAGE) 500 MG tablet Take 500 mg by mouth 2 (two) times daily with a meal.      metoprolol (LOPRESSOR) 50 MG tablet  Take 25 mg by mouth 2 (two) times daily.      Multiple Vitamin (MULITIVITAMIN WITH MINERALS) TABS Take 1 tablet by mouth daily.      mupirocin ointment (BACTROBAN) 2 % Apply 1 application topically 3 (three) times daily.      ondansetron (ZOFRAN-ODT) 4 MG disintegrating tablet Take 4 mg by mouth every 8 (eight) hours as needed. For nausea and vomiting     vitamin B-12 (CYANOCOBALAMIN) 100 MCG tablet Take 100 mcg by mouth daily.      warfarin (COUMADIN) 5 MG tablet Take 5 mg by mouth daily.       STOP taking these medications     azithromycin (ZITHROMAX) 250 MG tablet        Follow-up Information    Follow up with [name]. (will call to arrange home health visit)    Contact information:   [phone]      Follow up with OWENS,REBECCA, FNP in 1 week.          The results of significant diagnostics from this hospitalization (including imaging, microbiology, ancillary and laboratory) are listed below for reference.    Significant Diagnostic Studies: Dg Chest 2 View  10/06/2011  *RADIOLOGY REPORT*  Clinical Data: Weakness.  CHEST - 2 VIEW  Comparison: 11/09/2004  Findings: Chronic cardiomegaly, unchanged.  Pulmonary vascularity is normal and the lungs are clear of acute infiltrates and effusions.  Calcified granulomas in the right midzone and in the left base with slight scarring at the left midzone, unchanged. Evidence of prior CABG and aortic valve replacement.  No acute osseous abnormality.  IMPRESSION: No acute disease.  Chronic cardiomegaly.  Original Report Authenticated By: Larey Seat, M.D.   Ct Head Wo Contrast  10/06/2011  *RADIOLOGY REPORT*  Clinical Data: Generalized weakness  CT HEAD WITHOUT CONTRAST  Technique:  Contiguous axial images were obtained from the base of the skull through the vertex without contrast.  Comparison: None.  Findings: The patient does not show accelerated atrophy.  No sign of old or acute infarction, mass lesion, hemorrhage, hydrocephalus  or extra-axial collection.  Sinuses are clear.  No calvarial abnormality.  IMPRESSION: Normal head CT for a person of this age.  Original Report Authenticated By: Jules Schick, M.D.    Microbiology: Recent Results (from the past 240 hour(s))  URINE CULTURE     Status: Normal   Collection Time   10/06/11 12:51 PM      Component Value Range Status Comment   Specimen Description URINE, CLEAN CATCH   Final    Special Requests Normal   Final    Setup Time [id number]   Final    Colony Count 3,000 COLONIES/ML   Final    Culture INSIGNIFICANT GROWTH   Final    Report Status 10/07/2011 FINAL   Final      Labs: Basic Metabolic Panel:  Lab [id number] 0410 10/06/11 1640 10/06/11 1559 10/06/11 1140  NA 133* 127* 133* 124*  K 3.9 3.7 -- --  CL 102 98 100 90*  CO2 25 22 -- 24  GLUCOSE 95 109* 87 158*  BUN 8 11 10  13  CREATININE 0.68 0.61 0.60 0.64  CALCIUM 8.7 8.4 -- 9.4  MG -- -- -- --  PHOS -- -- -- --   Liver Function Tests:  Lab 10/06/11 1140  AST 21  ALT 20  ALKPHOS 54  BILITOT 0.5  PROT 6.7  ALBUMIN 4.0    Lab 10/06/11 1140  LIPASE 22  AMYLASE --   CBC:  Lab 10/07/11 0410 10/06/11 1559 10/06/11 1140  WBC 4.9 -- 6.5  NEUTROABS -- -- 5.3  HGB 10.5* 9.9* 11.6*  HCT 29.6* 29.0* 31.8*  MCV 87.6 -- 85.7  PLT 224 -- 224   CBG:  Lab 10/09/11 1151 10/09/11 0737 10/08/11 2051 10/08/11 1645 10/08/11 1205  GLUCAP 168* 116* 107* 113* 163*    Time coordinating discharge: 25 minutes.  Signed:  Murray Hodgkins, MD  [facility] 10/09/2011, 4:59 PM

## 2011-10-09 NOTE — Progress Notes (Signed)
PROGRESS NOTE  [name] [id number] DOB: 01/07/1941 DOA: 10/06/2011 PCP: Luanne Bras, FNP, FNP  Brief narrative: 70 year old woman seen by primary care provider one day prior to admission and diagnosed with influenza and bacterial sinus infection and placed on Zithromax. Presented to the emergency department with complaints of dizziness, lightheadedness, nausea and vomiting. Found to have hyponatremia. Emergency department physician was concerned for vestibular neuritis.  Past medical history: Aortic valve replacement on warfarin, hypertension, diabetes mellitus, hyperlipidemia, hyponatremia  Consultants:  Occupational therapy: Home health with 24-hour assistance versus skilled nursing facility.  Procedures:  None  Antibiotics:  December 28: Zithromax  Interim History: Interval documentation reviewed. Subjective: Continues to feel better. She is quite serious about going home.  Objective: Filed Vitals:   10/09/11 0346 10/09/11 0723 10/09/11 0912 10/09/11 1458  BP: 183/76 155/47 148/68 170/59  Pulse: 57 59 54 56  Temp: 97.5 F (36.4 C) 98 F (36.7 C)  97.4 F (36.3 C)  TempSrc: Oral Oral  Oral  Resp: 18 18  18   Height:      Weight:      SpO2: 98% 99%  98%    Intake/Output Summary (Last 24 hours) at 10/09/11 1645 Last data filed at 10/09/11 1300  Gross per 24 hour  Intake    720 ml  Output    300 ml  Net    420 ml    Exam:  General: Appears calm and comfortable. Cardiovascular: Regular rate and rhythm. No murmur, rub or gallop.  Respiratory: Clear to auscultation bilaterally. No wheezes, rales or rhonchi. Normal respiratory effort.  Data Reviewed: Basic Metabolic Panel:  Lab [id number] 0410 10/06/11 1640 10/06/11 1559 10/06/11 1140  NA 133* 127* 133* 124*  K 3.9 3.7 -- --  CL 102 98 100 90*  CO2 25 22 -- 24  GLUCOSE 95 109* 87 158*  BUN 8 11 10 13   CREATININE 0.68 0.61 0.60 0.64  CALCIUM 8.7 8.4 -- 9.4  MG -- -- -- --  PHOS -- -- -- --    Liver Function Tests:  Lab 10/06/11 1140  AST 21  ALT 20  ALKPHOS 54  BILITOT 0.5  PROT 6.7  ALBUMIN 4.0    Lab 10/06/11 1140  LIPASE 22  AMYLASE --   CBC:  Lab 10/07/11 0410 10/06/11 1559 10/06/11 1140  WBC 4.9 -- 6.5  NEUTROABS -- -- 5.3  HGB 10.5* 9.9* 11.6*  HCT 29.6* 29.0* 31.8*  MCV 87.6 -- 85.7  PLT 224 -- 224   CBG:  Lab 10/09/11 1151 10/09/11 0737 10/08/11 2051 10/08/11 1645 10/08/11 1205  GLUCAP 168* 116* 107* 113* 163*   Studies: Dg Chest 2 View  10/06/2011  *RADIOLOGY REPORT*  Clinical Data: Weakness.  CHEST - 2 VIEW  Comparison: 11/09/2004  Findings: Chronic cardiomegaly, unchanged.  Pulmonary vascularity is normal and the lungs are clear of acute infiltrates and effusions.  Calcified granulomas in the right midzone and in the left base with slight scarring at the left midzone, unchanged. Evidence of prior CABG and aortic valve replacement.  No acute osseous abnormality.  IMPRESSION: No acute disease.  Chronic cardiomegaly.  Original Report Authenticated By: Larey Seat, M.D.   Ct Head Wo Contrast  10/06/2011  *RADIOLOGY REPORT*  Clinical Data: Generalized weakness  CT HEAD WITHOUT CONTRAST  Technique:  Contiguous axial images were obtained from the base of the skull through the vertex without contrast.  Comparison: None.  Findings: The patient does not show accelerated atrophy.  No sign of  old or acute infarction, mass lesion, hemorrhage, hydrocephalus or extra-axial collection.  Sinuses are clear.  No calvarial abnormality.  IMPRESSION: Normal head CT for a person of this age.  Original Report Authenticated By: Jules Schick, M.D.    Scheduled Meds:    . aspirin EC  81 mg Oral Daily  . azithromycin  250 mg Oral Daily  . buPROPion  100 mg Oral BID  . cloNIDine  0.1 mg Oral Once  . hydrALAZINE  25 mg Oral BID  . insulin aspart  0-9 Units Subcutaneous TID WC  . metoprolol  25 mg Oral BID  . mulitivitamin with minerals  1 tablet Oral Daily  .  mupirocin ointment  1 application Topical TID  . oseltamivir  75 mg Oral BID  . simvastatin  20 mg Oral Daily  . sodium chloride  3 mL Intravenous Q12H  . warfarin  5 mg Oral ONCE-1800   Continuous Infusions:    Assessment/Plan: 1. Influenza: Tamiflu for total 5 days. 2. Bacterial sinusitis: I suspect this is actually bacterial otitis as the patient complains of ear symptoms rather than symptoms referable to her sinuses. Her symptoms of vertigo did begin prior to beginning Zithromax so there is no reason to suspect an adverse drug reaction at this point. 3. Hyponatremia: Etiology unclear. Improved. Not on any obvious offending medications. She reports that this was noted several months ago by her primary care provider. This can be further evaluated in the outpatient setting. 4. BPPV: Meclizine. Appreciate therapy recommendations. Antiemetics. Home health physical and occupational therapy/vestibular treatment. 5. Anemia: Appears to be stable and near baseline. Followup as an outpatient. 6. Diabetes mellitus type 2: Controlled. Capillary blood sugars stable. Hemoglobin A1c 6.4. 7. History of aortic valve replacement on chronic warfarin: INR therapeutic. Continue warfarin per pharmacy. 8. Hypertension: Stable. Continue metoprolol, hydralazine. Negative orthostatics.   Code Status: Full.   Disposition Plan: Home today with home health physical and occupational therapy with vestibular treatment.   Murray Hodgkins, MD  [facility] Pager [phone] 10/09/2011, 4:45 PM    LOS: 3 days

## 2011-10-09 NOTE — Progress Notes (Addendum)
Occupational Therapy Treatment Patient Details Name: [name] MRN: [id number] DOB: 1940-12-24 Today's Date: 10/09/2011 1402 0439 2 ta  OT Assessment/Plan OT Assessment/Plan Comments on Treatment Session: not able to do brandt-daroff without Assist OT Frequency: Min 2X/week Follow Up Recommendations: Skilled nursing facility.  Pt lives alone with intermittent assistance.  She still has some dizziness/nausea with provoking positions and cannot perform exercises independently.  I have encouraged her to go to skilled nursing facility, but she states she thinks she will be OK and wants to go home.  If pt refuses stsnf, then please order HHPT/OT and make sure to specify that one discipline should do vestibular treatment.   OT Goals Miscellaneous OT Goals Miscellaneous OT Goal #1: pt will be independent with brandt daroff exercise for R BPPV OT Goal: Miscellaneous Goal #1 - Progress: Not met  OT Treatment Precautions/Restrictions      ADL ADL ADL Comments: pt initially not dizzy with Marye Round, however when I placed her in modified position for home program, she got dizzy, espeicallly to R and a little nauseaus Mobility  Bed Mobility Supine to Sit: 5: Supervision Transfers Sit to Stand: 5: Supervision Sit to Stand Details (indicate cue type and reason): min guard walking with walker to chair; slightly unsteady Exercises    End of Session General Behavior During Session: [facility] for tasks performed Cognition: [facility] for tasks performed  Talia Hoheisel [phone] 10/09/2011, 2:44 PM

## 2011-10-09 NOTE — Progress Notes (Signed)
Spoke with patient at bedside. States she wishes to go home but not sure she can have supervision at home. Discussed recommendations from OT, states she will discuss with her dtr when she comes in. Left list of [facility] agencies with patient for choice. Will f/u with patient later.

## 2012-03-12 HISTORY — PX: OTHER SURGICAL HISTORY: SHX169

## 2012-08-22 ENCOUNTER — Other Ambulatory Visit (HOSPITAL_COMMUNITY): Payer: Self-pay

## 2012-08-22 DIAGNOSIS — Z952 Presence of prosthetic heart valve: Secondary | ICD-10-CM

## 2012-08-29 ENCOUNTER — Ambulatory Visit (HOSPITAL_COMMUNITY)
Admission: RE | Admit: 2012-08-29 | Discharge: 2012-08-29 | Disposition: A | Discharge status: Home / Self Care | Payer: Medicare Other | Source: Ambulatory Visit | Attending: Cardiovascular Disease | Admitting: Cardiovascular Disease

## 2012-08-29 DIAGNOSIS — Z952 Presence of prosthetic heart valve: Secondary | ICD-10-CM

## 2012-08-29 DIAGNOSIS — Z954 Presence of other heart-valve replacement: Secondary | ICD-10-CM | POA: Insufficient documentation

## 2012-08-29 DIAGNOSIS — I379 Nonrheumatic pulmonary valve disorder, unspecified: Secondary | ICD-10-CM | POA: Insufficient documentation

## 2012-08-29 DIAGNOSIS — I059 Rheumatic mitral valve disease, unspecified: Secondary | ICD-10-CM | POA: Insufficient documentation

## 2012-08-29 DIAGNOSIS — I079 Rheumatic tricuspid valve disease, unspecified: Secondary | ICD-10-CM | POA: Insufficient documentation

## 2012-08-29 DIAGNOSIS — I2510 Atherosclerotic heart disease of native coronary artery without angina pectoris: Secondary | ICD-10-CM | POA: Insufficient documentation

## 2012-08-29 HISTORY — PX: TRANSTHORACIC ECHOCARDIOGRAM: SHX275

## 2012-08-29 NOTE — Progress Notes (Signed)
[facility]   2D echo completed 08/29/2012.   Jamison Neighbor, RDCS

## 2012-12-23 ENCOUNTER — Ambulatory Visit: Payer: Self-pay | Admitting: Cardiovascular Disease

## 2012-12-23 DIAGNOSIS — Z952 Presence of prosthetic heart valve: Secondary | ICD-10-CM

## 2012-12-23 DIAGNOSIS — Z7901 Long term (current) use of anticoagulants: Secondary | ICD-10-CM

## 2013-03-12 ENCOUNTER — Encounter: Payer: Self-pay | Admitting: Cardiovascular Disease

## 2013-03-14 ENCOUNTER — Ambulatory Visit (INDEPENDENT_AMBULATORY_CARE_PROVIDER_SITE_OTHER): Payer: Medicare Other | Admitting: Pharmacist Clinician (PhC)/ Clinical Pharmacy Specialist

## 2013-03-14 VITALS — BP 142/60 | HR 60

## 2013-03-14 DIAGNOSIS — Z954 Presence of other heart-valve replacement: Secondary | ICD-10-CM

## 2013-03-14 DIAGNOSIS — Z952 Presence of prosthetic heart valve: Secondary | ICD-10-CM

## 2013-03-14 DIAGNOSIS — Z7901 Long term (current) use of anticoagulants: Secondary | ICD-10-CM

## 2013-04-10 ENCOUNTER — Telehealth (HOSPITAL_COMMUNITY): Payer: Self-pay | Admitting: Cardiovascular Disease

## 2013-04-10 ENCOUNTER — Other Ambulatory Visit (HOSPITAL_COMMUNITY): Payer: Self-pay | Admitting: Cardiovascular Disease

## 2013-04-10 DIAGNOSIS — I739 Peripheral vascular disease, unspecified: Secondary | ICD-10-CM

## 2013-04-10 NOTE — Telephone Encounter (Signed)
Left message for patient to call back Message also left on 01/21/2013

## 2013-04-14 ENCOUNTER — Ambulatory Visit (INDEPENDENT_AMBULATORY_CARE_PROVIDER_SITE_OTHER): Payer: Medicare Other | Admitting: Pharmacist Clinician (PhC)/ Clinical Pharmacy Specialist

## 2013-04-14 VITALS — BP 130/56 | HR 64

## 2013-04-14 DIAGNOSIS — Z954 Presence of other heart-valve replacement: Secondary | ICD-10-CM

## 2013-04-14 DIAGNOSIS — Z952 Presence of prosthetic heart valve: Secondary | ICD-10-CM

## 2013-04-14 DIAGNOSIS — Z7901 Long term (current) use of anticoagulants: Secondary | ICD-10-CM

## 2013-04-14 LAB — POCT INR: INR: 3.5

## 2013-05-05 ENCOUNTER — Other Ambulatory Visit: Payer: Self-pay | Admitting: *Deleted

## 2013-05-05 MED ORDER — IRBESARTAN 300 MG PO TABS: 300.0000 mg | ORAL_TABLET | Freq: Every day | ORAL | Status: DC

## 2013-05-05 NOTE — Telephone Encounter (Signed)
Rx was sent to pharmacy electronically. 

## 2013-05-09 ENCOUNTER — Telehealth (HOSPITAL_COMMUNITY): Payer: Self-pay | Admitting: Cardiovascular Disease

## 2013-05-12 ENCOUNTER — Ambulatory Visit (INDEPENDENT_AMBULATORY_CARE_PROVIDER_SITE_OTHER): Payer: Medicare Other | Admitting: Pharmacist Clinician (PhC)/ Clinical Pharmacy Specialist

## 2013-05-12 DIAGNOSIS — Z952 Presence of prosthetic heart valve: Secondary | ICD-10-CM

## 2013-05-12 DIAGNOSIS — Z954 Presence of other heart-valve replacement: Secondary | ICD-10-CM

## 2013-05-12 DIAGNOSIS — Z7901 Long term (current) use of anticoagulants: Secondary | ICD-10-CM

## 2013-05-12 LAB — POCT INR: INR: 3.7

## 2013-05-14 ENCOUNTER — Other Ambulatory Visit: Payer: Self-pay | Admitting: Pharmacist Clinician (PhC)/ Clinical Pharmacy Specialist

## 2013-05-19 ENCOUNTER — Other Ambulatory Visit: Payer: Self-pay | Admitting: Pharmacist Clinician (PhC)/ Clinical Pharmacy Specialist

## 2013-05-19 NOTE — Telephone Encounter (Signed)
States she is taking hydralazine BID.  New Rx sent to pharmacy.

## 2013-05-19 NOTE — Telephone Encounter (Signed)
Pt is returning your call about medication

## 2013-06-02 ENCOUNTER — Ambulatory Visit (HOSPITAL_COMMUNITY)
Admission: RE | Admit: 2013-06-02 | Discharge: 2013-06-02 | Disposition: A | Discharge status: Home / Self Care | Payer: Medicare Other | Source: Ambulatory Visit | Attending: Cardiovascular Disease | Admitting: Cardiovascular Disease

## 2013-06-02 ENCOUNTER — Ambulatory Visit (INDEPENDENT_AMBULATORY_CARE_PROVIDER_SITE_OTHER): Payer: Medicare Other | Admitting: Pharmacist Clinician (PhC)/ Clinical Pharmacy Specialist

## 2013-06-02 VITALS — BP 134/56 | HR 64

## 2013-06-02 DIAGNOSIS — I6529 Occlusion and stenosis of unspecified carotid artery: Secondary | ICD-10-CM

## 2013-06-02 DIAGNOSIS — I739 Peripheral vascular disease, unspecified: Secondary | ICD-10-CM | POA: Insufficient documentation

## 2013-06-02 DIAGNOSIS — Z7901 Long term (current) use of anticoagulants: Secondary | ICD-10-CM

## 2013-06-02 DIAGNOSIS — Z952 Presence of prosthetic heart valve: Secondary | ICD-10-CM

## 2013-06-02 DIAGNOSIS — Z954 Presence of other heart-valve replacement: Secondary | ICD-10-CM

## 2013-06-02 NOTE — Progress Notes (Signed)
Carotid Duplex Completed. Rage Beever, BS, RDMS, RVT  

## 2013-06-05 ENCOUNTER — Encounter: Payer: Self-pay | Admitting: *Deleted

## 2013-06-05 ENCOUNTER — Telehealth: Payer: Self-pay | Admitting: *Deleted

## 2013-06-05 DIAGNOSIS — I6529 Occlusion and stenosis of unspecified carotid artery: Secondary | ICD-10-CM

## 2013-06-05 NOTE — Telephone Encounter (Signed)
Message copied by Chauncy Lean on Thu Jun 05, 2013  9:47 AM ------      Message from: Lorretta Harp      Created: Wed Jun 04, 2013  5:09 PM       No change from prior study. Repeat in 6 months ------

## 2013-06-23 ENCOUNTER — Ambulatory Visit (INDEPENDENT_AMBULATORY_CARE_PROVIDER_SITE_OTHER): Payer: Medicare Other | Admitting: Pharmacist Clinician (PhC)/ Clinical Pharmacy Specialist

## 2013-06-23 VITALS — BP 162/60 | HR 60

## 2013-06-23 DIAGNOSIS — Z952 Presence of prosthetic heart valve: Secondary | ICD-10-CM

## 2013-06-23 DIAGNOSIS — Z954 Presence of other heart-valve replacement: Secondary | ICD-10-CM

## 2013-06-23 DIAGNOSIS — Z7901 Long term (current) use of anticoagulants: Secondary | ICD-10-CM

## 2013-06-23 LAB — POCT INR: INR: 3.1

## 2013-07-21 ENCOUNTER — Ambulatory Visit (INDEPENDENT_AMBULATORY_CARE_PROVIDER_SITE_OTHER): Payer: Medicare Other | Admitting: Pharmacist Clinician (PhC)/ Clinical Pharmacy Specialist

## 2013-07-21 VITALS — BP 158/60 | HR 76

## 2013-07-21 DIAGNOSIS — Z952 Presence of prosthetic heart valve: Secondary | ICD-10-CM

## 2013-07-21 DIAGNOSIS — Z954 Presence of other heart-valve replacement: Secondary | ICD-10-CM

## 2013-07-21 DIAGNOSIS — Z7901 Long term (current) use of anticoagulants: Secondary | ICD-10-CM

## 2013-07-21 LAB — POCT INR: INR: 3.1

## 2013-07-22 ENCOUNTER — Telehealth: Payer: Self-pay | Admitting: *Deleted

## 2013-07-22 NOTE — Telephone Encounter (Signed)
Message copied by Chauncy Lean on Tue Jul 22, 2013  4:12 PM ------      Message from: Lorretta Harp      Created: Mon Jul 21, 2013  4:54 PM      Regarding: preoperative clearance before total hip replacement       She will need a pharmacologic Myoview stress test  to preoperatively clear her. She'll also need Lovenox bridging because of her St. Jude aVR on Coumadin ------

## 2013-07-22 NOTE — Telephone Encounter (Signed)
Pre op for hip surgery.  I advised patient that Dr [name] wants her to have a myoview, but patient is adamant that she cannot tolerate having a myoview.  She says that there is no way that she can lay on the table for the images. I will defer to Dr [name].

## 2013-07-24 ENCOUNTER — Other Ambulatory Visit (HOSPITAL_COMMUNITY): Payer: Self-pay | Admitting: Cardiovascular Disease

## 2013-07-24 DIAGNOSIS — Z952 Presence of prosthetic heart valve: Secondary | ICD-10-CM

## 2013-07-29 NOTE — Telephone Encounter (Signed)
[name] [name] reviewed the chart.  He will clear her,but at a moderately increased risk due to history of CAD and no recent myoview.  [name] understand that she is at a higher risk for surgery and verbalizes her understanding that [name] medical advice is to have a stress test prior to the surgery.  She wants to proceed with the surgery without the stress test.  I faxed in the form to [facility] for surgery.    I advised [name] to contact our pharmacist, [name] with a surgery date so that she can be bridged with lovenox.  Patient verbalized understanding.

## 2013-08-06 ENCOUNTER — Telehealth: Payer: Self-pay | Admitting: Cardiovascular Disease

## 2013-08-06 NOTE — Telephone Encounter (Signed)
Was suppose to let you know when her surgery  Is. It is November 21,2014.

## 2013-08-06 NOTE — Telephone Encounter (Signed)
Surgery date noted, will arrange lovenox bridge for procedure

## 2013-08-11 ENCOUNTER — Other Ambulatory Visit: Payer: Self-pay | Admitting: Family Medicine

## 2013-08-11 DIAGNOSIS — M810 Age-related osteoporosis without current pathological fracture: Secondary | ICD-10-CM

## 2013-08-11 DIAGNOSIS — Z1231 Encounter for screening mammogram for malignant neoplasm of breast: Secondary | ICD-10-CM

## 2013-08-14 ENCOUNTER — Telehealth: Payer: Self-pay | Admitting: Pharmacist Clinician (PhC)/ Clinical Pharmacy Specialist

## 2013-08-14 ENCOUNTER — Other Ambulatory Visit (HOSPITAL_COMMUNITY): Payer: Self-pay | Admitting: Orthopaedic Surgery

## 2013-08-14 NOTE — Telephone Encounter (Signed)
THA by Dr. Santiago Bumpers scheduled for 11/21.  Needs lovenox bridge.  Pt has INR appt on 11/10.  Will give info to pt at that time.    Called back at 5:10pm - LMOM for [facility] to see if they have any current labs on [name].  Our last BMP/CMP is from 2012.  Need more current information to dose enoxaprin

## 2013-08-18 ENCOUNTER — Ambulatory Visit (INDEPENDENT_AMBULATORY_CARE_PROVIDER_SITE_OTHER): Payer: Medicare Other | Admitting: Cardiovascular Disease

## 2013-08-18 ENCOUNTER — Encounter: Payer: Self-pay | Admitting: Cardiovascular Disease

## 2013-08-18 ENCOUNTER — Ambulatory Visit (INDEPENDENT_AMBULATORY_CARE_PROVIDER_SITE_OTHER): Payer: Medicare Other | Admitting: Pharmacist Clinician (PhC)/ Clinical Pharmacy Specialist

## 2013-08-18 VITALS — BP 140/60 | HR 57 | Ht 62.0 in | Wt 154.2 lb

## 2013-08-18 DIAGNOSIS — Z954 Presence of other heart-valve replacement: Secondary | ICD-10-CM

## 2013-08-18 DIAGNOSIS — E785 Hyperlipidemia, unspecified: Secondary | ICD-10-CM

## 2013-08-18 DIAGNOSIS — Z952 Presence of prosthetic heart valve: Secondary | ICD-10-CM

## 2013-08-18 DIAGNOSIS — I10 Essential (primary) hypertension: Secondary | ICD-10-CM

## 2013-08-18 DIAGNOSIS — Z01818 Encounter for other preprocedural examination: Secondary | ICD-10-CM

## 2013-08-18 DIAGNOSIS — I739 Peripheral vascular disease, unspecified: Secondary | ICD-10-CM

## 2013-08-18 DIAGNOSIS — I779 Disorder of arteries and arterioles, unspecified: Secondary | ICD-10-CM

## 2013-08-18 DIAGNOSIS — Z7901 Long term (current) use of anticoagulants: Secondary | ICD-10-CM

## 2013-08-18 DIAGNOSIS — I2510 Atherosclerotic heart disease of native coronary artery without angina pectoris: Secondary | ICD-10-CM

## 2013-08-18 DIAGNOSIS — E119 Type 2 diabetes mellitus without complications: Secondary | ICD-10-CM

## 2013-08-18 DIAGNOSIS — Z79899 Other long term (current) drug therapy: Secondary | ICD-10-CM

## 2013-08-18 NOTE — Assessment & Plan Note (Signed)
Controlled on current medications 

## 2013-08-18 NOTE — Assessment & Plan Note (Signed)
Status post coronary artery bypass grafting times 07/10/2004 at the time of aortic valve replacement. Her last functional study 05/19/10 with nonischemic. She denies chest pain or shortness of breath. She is scheduled for right total knee replacement by Dr. Jean Rosenthal. I'm going to get a pharmacologic Myoview stress test to risk stratify her prior to her orthopedic procedure.

## 2013-08-18 NOTE — Progress Notes (Signed)
08/18/2013 [name]   [birth year]  [id number]  Primary Physician [name] Blackbird, MD Primary Cardiologist: [name] Harp MD [name]   HPI:  The patient is a 72 year old, mildly overweight, widowed Caucasian female mother of 29, grandmother to 17 grandchildren whose daughter unfortunately recently died of ALS after a long protracted course. The patient has a history of CAD and valvular heart disease as well as PVOD. She is status post coronary artery bypass grafting x2 with a St. Jude AVR July 13, 2004. Her other problems include hypertension, hyperlipidemia, diabetes, and discontinued tobacco abuse. I dilated her left common iliac and redilated her for in-stent restenosis in 2006. We have been following her lower extremity and carotid Dopplers since that time. She does have mild to moderate right and mild left ICA stenosis which has remained stable. She is neurologically asymptomatic. She denies chest pain, shortness of breath or claudication. [name] follows her lipid profile which was done most recently in March revealing a total cholesterol of 106, LDL of 53 and HDL of 29. Her most recent carotid Doppler in June did show moderate right and moderately severe left ICA stenosis which had not changed, and her lower extremity Dopplers revealed ABIs of 1 bilaterally with bilateral iliac disease. Since I saw her last a year ago she's remained stable and is asymptomatic otherwise. She does give right total hip replacement which is scheduled to be performed in Dr. Jean [name] next week. I'm going to get a 2-D echocardiogram to assess her aortic prosthesis as well as a pharmacologic Myoview stress test to stratify her symptoms been over a year since her last functional study and she is a diabetic of 10 years post bypass grafting.    Current Outpatient Prescriptions  Medication Sig Dispense Refill  . alendronate (FOSAMAX) 70 MG tablet Take 70 mg by mouth every 7 (seven)  days. On Saturdays. Take with a full glass of water on an empty stomach.       . ALPRAZolam (XANAX) 0.5 MG tablet Take 0.25 mg by mouth 2 (two) times daily as needed. For anxiety       . aspirin EC 81 MG tablet Take 81 mg by mouth daily. Patient only takes on Mon Wed and Friday      . atorvastatin (LIPITOR) 80 MG tablet Take 80 mg by mouth daily.        . furosemide (LASIX) 20 MG tablet Take 40 mg by mouth. Takes 40mg  1.5mg  daily      . hydrALAZINE (APRESOLINE) 50 MG tablet Take 50 mg by mouth 2 (two) times daily.      . irbesartan (AVAPRO) 300 MG tablet Take 1 tablet (300 mg total) by mouth at bedtime.  30 tablet  11  . metFORMIN (GLUCOPHAGE) 500 MG tablet Take 500 mg by mouth 2 (two) times daily with a meal.        . metoprolol (LOPRESSOR) 50 MG tablet Take 25 mg by mouth 2 (two) times daily.        . Multiple Minerals-Vitamins (CALCIUM & VIT D3 BONE HEALTH PO) Take 1 capsule by mouth daily.      . Multiple Vitamin (MULITIVITAMIN WITH MINERALS) TABS Take 1 tablet by mouth daily.        . mupirocin ointment (BACTROBAN) 2 % Apply 1 application topically 3 (three) times daily.        . Pyridoxine HCl (VITAMIN B-6 PO) Take 1 tablet by mouth daily.      [name]  traMADol (ULTRAM) 50 MG tablet Take 50 mg by mouth every 6 (six) hours as needed.      . vitamin B-12 (CYANOCOBALAMIN) 100 MCG tablet Take 100 mcg by mouth daily.        [name] warfarin (COUMADIN) 5 MG tablet Take 5 mg by mouth daily.        No current facility-administered medications for this visit.    Allergies  Allergen Reactions  . Zetia [Ezetimibe] Other (See Comments)    Reaction=unknown  . Zocor [Simvastatin - High Dose]     History   Social History  . Marital Status: Widowed    Spouse Name: N/A    Number of Children: N/A  . Years of Education: N/A   Occupational History  . Not on file.   Social History Main Topics  . Smoking status: Former Smoker -- 1.00 packs/day for 30 years    Types: Cigarettes  . Smokeless tobacco: Not  on file  . Alcohol Use: No  . Drug Use: No  . Sexual Activity: Not on file   Other Topics Concern  . Not on file   Social History Narrative  . No narrative on file     Review of Systems: General: negative for chills, fever, night sweats or weight changes.  Cardiovascular: negative for chest pain, dyspnea on exertion, edema, orthopnea, palpitations, paroxysmal nocturnal dyspnea or shortness of breath Dermatological: negative for rash Respiratory: negative for cough or wheezing Urologic: negative for hematuria Abdominal: negative for nausea, vomiting, diarrhea, bright red blood per rectum, melena, or hematemesis Neurologic: negative for visual changes, syncope, or dizziness All other systems reviewed and are otherwise negative except as noted above.    Blood pressure 140/60, pulse 57, height 5\' 2"  (1.575 m), weight 154 lb 3.2 oz (69.945 kg).  General appearance: alert and no distress Neck: no adenopathy, no JVD, supple, symmetrical, trachea midline, thyroid not enlarged, symmetric, no tenderness/mass/nodules and bilateral carotid bruits right louder than left Lungs: clear to auscultation bilaterally Heart: regular rate and rhythm, S1, S2 normal, no murmur, click, rub or gallop Extremities: extremities normal, atraumatic, no cyanosis or edema  EKG sinus bradycardia 57 without ST or T wave changes  ASSESSMENT AND PLAN:   Coronary artery disease Status post coronary artery bypass grafting times 07/10/2004 at the time of aortic valve replacement. Her last functional study 05/19/10 with nonischemic. She denies chest pain or shortness of breath. She is scheduled for right total knee replacement by Dr. Jean [name]. I'm going to get a pharmacologic Myoview stress test to risk stratify her prior to her orthopedic procedure.  Mechanical heart valve present Status post St. Jude aortic valve replacement October 2005. She is on Coumadin anticoagulation. Her last 2-D echo performed  08/29/12 revealed a well functioning aortic prosthesis. This will be repeated.  Hypertension Controlled on current medications  Hypercholesteremia On statin therapy followed by her PCP  Carotid artery disease Moderate bilateral internal carotid artery stenosis by duplex ultrasound was recently checked 06/02/13. This remained stable over the last year. We'll continue to follow this noninvasively by duplex ultrasound..  Peripheral arterial disease Status post left common iliac artery PTA and stenting by myself for chronic total occlusion of left common iliac artery 08/26/01 with an 8 x 6 Smart Also and extend. She did develop 40% right common iliac artery stenosis at that time. She was restudied 11/09/04 revealing a 70% in-stent restenosis which was redilated. She denies claudication. Been over a year since her last Doppler study which we  will recheck.      [name] Harp MD FACP,FACC,FAHA, [facility] 08/18/2013 3:03 PM

## 2013-08-18 NOTE — Assessment & Plan Note (Signed)
On statin therapy followed by her PCP 

## 2013-08-18 NOTE — Assessment & Plan Note (Addendum)
Moderate bilateral internal carotid artery stenosis by duplex ultrasound was recently checked 06/02/13. This remained stable over the last year. We'll continue to follow this noninvasively by duplex ultrasound.[name]

## 2013-08-18 NOTE — Patient Instructions (Addendum)
  We will see you back in follow up in 6 months with Kerin Ransom [facility] and 1 year with Dr [name].  Dr [name] has ordered an echocardiogram and a lexiscan myoview.  Also a lower extremity arterial doppler.   Please have blood work done fasting.

## 2013-08-18 NOTE — Assessment & Plan Note (Signed)
Status post St. Jude aortic valve replacement October 2005. She is on Coumadin anticoagulation. Her last 2-D echo performed 08/29/12 revealed a well functioning aortic prosthesis. This will be repeated.

## 2013-08-18 NOTE — Assessment & Plan Note (Signed)
Status post left common iliac artery PTA and stenting by myself for chronic total occlusion of left common iliac artery 08/26/01 with an 8 x 6 Smart Also and extend. She did develop 40% right common iliac artery stenosis at that time. She was restudied 11/09/04 revealing a 70% in-stent restenosis which was redilated. She denies claudication. Been over a year since her last Doppler study which we will recheck.

## 2013-08-19 ENCOUNTER — Encounter: Payer: Self-pay | Admitting: Cardiovascular Disease

## 2013-08-19 ENCOUNTER — Telehealth: Payer: Self-pay | Admitting: *Deleted

## 2013-08-19 ENCOUNTER — Telehealth: Payer: Self-pay | Admitting: Cardiovascular Disease

## 2013-08-19 ENCOUNTER — Ambulatory Visit (HOSPITAL_COMMUNITY)
Admission: RE | Admit: 2013-08-19 | Discharge: 2013-08-19 | Disposition: A | Discharge status: Home / Self Care | Payer: Medicare Other | Source: Ambulatory Visit | Attending: Cardiovascular Disease | Admitting: Cardiovascular Disease

## 2013-08-19 DIAGNOSIS — Z01810 Encounter for preprocedural cardiovascular examination: Secondary | ICD-10-CM

## 2013-08-19 DIAGNOSIS — I379 Nonrheumatic pulmonary valve disorder, unspecified: Secondary | ICD-10-CM | POA: Insufficient documentation

## 2013-08-19 DIAGNOSIS — E785 Hyperlipidemia, unspecified: Secondary | ICD-10-CM | POA: Insufficient documentation

## 2013-08-19 DIAGNOSIS — I2510 Atherosclerotic heart disease of native coronary artery without angina pectoris: Secondary | ICD-10-CM

## 2013-08-19 DIAGNOSIS — E119 Type 2 diabetes mellitus without complications: Secondary | ICD-10-CM | POA: Insufficient documentation

## 2013-08-19 DIAGNOSIS — I080 Rheumatic disorders of both mitral and aortic valves: Secondary | ICD-10-CM | POA: Insufficient documentation

## 2013-08-19 DIAGNOSIS — I10 Essential (primary) hypertension: Secondary | ICD-10-CM | POA: Insufficient documentation

## 2013-08-19 DIAGNOSIS — I079 Rheumatic tricuspid valve disease, unspecified: Secondary | ICD-10-CM | POA: Insufficient documentation

## 2013-08-19 DIAGNOSIS — Z01818 Encounter for other preprocedural examination: Secondary | ICD-10-CM

## 2013-08-19 MED ORDER — ENOXAPARIN SODIUM 60 MG/0.6ML ~~LOC~~ SOLN: 60.0000 mg | Freq: Two times a day (BID) | SUBCUTANEOUS | Status: DC

## 2013-08-19 NOTE — Telephone Encounter (Signed)
Message forwarded to K. Alvstad, PharmD/Kathryn, RN

## 2013-08-19 NOTE — Telephone Encounter (Signed)
Patient presented for testing and requested her med list be updated based on her med bottle that she reviewed last night.  Corrections made

## 2013-08-19 NOTE — Telephone Encounter (Signed)
[name] is aware and will handle the prescription

## 2013-08-19 NOTE — Telephone Encounter (Signed)
Ms Vangorder having surgery and is expecting Lovenox to be called into his pharmacy by Dr [name].  He needs to get the rx called in asap because he thinks he will have to order it and she needs to start taking this weekend.  Please call

## 2013-08-19 NOTE — Progress Notes (Signed)
2D Echo Performed 08/19/2013    Marygrace Drought, RCS

## 2013-08-20 ENCOUNTER — Ambulatory Visit (HOSPITAL_COMMUNITY)
Admission: RE | Admit: 2013-08-20 | Discharge: 2013-08-20 | Disposition: A | Discharge status: Home / Self Care | Payer: Medicare Other | Source: Ambulatory Visit | Attending: Cardiology | Admitting: Cardiology

## 2013-08-20 DIAGNOSIS — Z01818 Encounter for other preprocedural examination: Secondary | ICD-10-CM

## 2013-08-20 DIAGNOSIS — Z01810 Encounter for preprocedural cardiovascular examination: Secondary | ICD-10-CM | POA: Insufficient documentation

## 2013-08-20 DIAGNOSIS — I2510 Atherosclerotic heart disease of native coronary artery without angina pectoris: Secondary | ICD-10-CM

## 2013-08-20 MED ORDER — TECHNETIUM TC 99M SESTAMIBI GENERIC - CARDIOLITE
30.0000 | Freq: Once | INTRAVENOUS | Status: AC | PRN
  Administered 2013-08-20: 30 via INTRAVENOUS

## 2013-08-20 MED ORDER — REGADENOSON 0.4 MG/5ML IV SOLN
0.4000 mg | Freq: Once | INTRAVENOUS | Status: AC
  Administered 2013-08-20: 0.4 mg via INTRAVENOUS

## 2013-08-20 MED ORDER — AMINOPHYLLINE 25 MG/ML IV SOLN
75.0000 mg | Freq: Once | INTRAVENOUS | Status: AC
  Administered 2013-08-20: 75 mg via INTRAVENOUS

## 2013-08-20 MED ORDER — TECHNETIUM TC 99M SESTAMIBI GENERIC - CARDIOLITE
10.0000 | Freq: Once | INTRAVENOUS | Status: AC | PRN
  Administered 2013-08-20: 10 via INTRAVENOUS

## 2013-08-20 NOTE — Procedures (Addendum)
[place] [address] [place] [id number]  Cardiology Nuclear Med Study  [name] is a 72 y.o. female     MRN : [id number]     DOB: 02/20/1941  Procedure Date: 08/20/2013  Nuclear Med Background Indication for Stress Test:  Surgical Clearance History:  CAD;CABG X2--07/10/2004;STENT/PTA--08/26/2001;ARTHRECTOMY;AVR--07/2004;MECHANICAL HEART VALVE;VALVULAR HEART DISEASE Cardiac Risk Factors: Carotid Disease, Family History - CAD, History of Smoking, Hypertension, Lipids, NIDDM, Overweight and PVD  Symptoms:  Dizziness   Nuclear Pre-Procedure Caffeine/Decaff Intake:  8:00pm NPO After: 6:00am   IV Site: R Antecubital  IV 0.9% NS with Angio Cath:  22g  Chest Size (in):  N/A IV Started by: Azucena Cecil, RN  Height: 5\' 2"  (1.575 m)  Cup Size: C  BMI:  Body mass index is 28.16 kg/(m^2). Weight:  154 lb (69.854 kg)   Tech Comments:  N/A    Nuclear Med Study 1 or 2 day study: 1 day  Stress Test Type:  [place]  Order Authorizing Provider:  Quay Burow, MD   Resting Radionuclide: Technetium 38m Sestamibi  Resting Radionuclide Dose: 10.3 mCi   Stress Radionuclide:  Technetium 12m Sestamibi  Stress Radionuclide Dose: 30.5 mCi           Stress Protocol Rest HR: 54 Stress HR: 81  Rest BP: 198/67 Stress BP: 171/53  Exercise Time (min): n/a METS: n/a   Predicted Max HR: 146 bpm % Max HR: 63.7 bpm Rate Pressure Product: 18414  Dose of Adenosine (mg):  n/a Dose of Lexiscan: 0.4 mg  Dose of Atropine (mg): n/a Dose of Dobutamine: n/a mcg/kg/min (at max HR)  Stress Test Technologist: Leane Para, CCT Nuclear Technologist: Otho Perl, CNMT   Rest Procedure:  Myocardial perfusion imaging was performed at rest 45 minutes following the intravenous administration of Technetium 31m Sestamibi. Stress Procedure:  The patient received IV Lexiscan 0.4 mg over 15-seconds.  Technetium 19m Sestamibi injected  at 30-seconds.  The patient experienced chest tightness and SOB; 75 mg of IV Aminophylline was administered with resolution.  There were no significant changes with Lexiscan.  Quantitative spect images were obtained after a 45 minute delay.  Transient Ischemic Dilatation (Normal <1.22):  1.08 Lung/Heart Ratio (Normal <0.45):  0.28 QGS EDV:  n/a QGS ESV:  n/a LV Ejection Fraction: not gated  Rest ECG: NSR - Normal EKG  Stress ECG: No significant change from baseline ECG  QPS Raw Data Images:  Normal; no motion artifact; normal heart/lung ratio. Stress Images:  There is decreased uptake in the anterior wall. Rest Images:  There is decreased uptake in the anterior wall. Subtraction (SDS):  There is reversible anterior ischemia.  Impression Exercise Capacity:  Lexiscan with no exercise. BP Response:  Normal blood pressure response. Clinical Symptoms:  Shortness of breath, chest tightness ECG Impression:  No significant ECG changes with Lexiscan. Comparison with Prior Nuclear Study: There appears to be a more significant anterior reversible defect.  Overall Impression:  High risk stress nuclear study with reversible anterior ischemia. The study was not gated, therefore shifting breast artifact cannot be excluded..  LV Wall Motion:  Non-gated due to sinus arryhtmia.  Pixie Casino, MD, [facility] Board Certified in Nuclear Cardiology Attending Cardiologist [place]ra C, MD  08/20/2013 1:17 PM

## 2013-08-22 ENCOUNTER — Encounter (HOSPITAL_COMMUNITY): Payer: Self-pay | Admitting: Pharmacy Technician

## 2013-08-22 ENCOUNTER — Telehealth: Payer: Self-pay | Admitting: Cardiovascular Disease

## 2013-08-22 NOTE — Telephone Encounter (Signed)
Returning your call from yesterday. °

## 2013-08-25 ENCOUNTER — Other Ambulatory Visit (HOSPITAL_COMMUNITY): Payer: Self-pay | Admitting: *Deleted

## 2013-08-25 NOTE — Telephone Encounter (Signed)
Spoke with patient and made follow up appt to review myoview test.

## 2013-08-25 NOTE — Telephone Encounter (Signed)
I spoke with [facility] and made them aware that [name] does NOT have cardiac clearance for surgery.  I spoke with Judeen Hammans at [facility] and they will reschedule the surgery.

## 2013-08-26 ENCOUNTER — Inpatient Hospital Stay (HOSPITAL_COMMUNITY): Admission: RE | Admit: 2013-08-26 | Payer: Medicare Other | Source: Ambulatory Visit

## 2013-08-28 ENCOUNTER — Ambulatory Visit (INDEPENDENT_AMBULATORY_CARE_PROVIDER_SITE_OTHER): Payer: Medicare Other | Admitting: Cardiovascular Disease

## 2013-08-28 ENCOUNTER — Encounter: Payer: Self-pay | Admitting: Cardiovascular Disease

## 2013-08-28 VITALS — BP 162/70 | HR 63 | Ht 62.0 in | Wt 152.4 lb

## 2013-08-28 DIAGNOSIS — I2510 Atherosclerotic heart disease of native coronary artery without angina pectoris: Secondary | ICD-10-CM

## 2013-08-28 DIAGNOSIS — Z952 Presence of prosthetic heart valve: Secondary | ICD-10-CM

## 2013-08-28 DIAGNOSIS — Z954 Presence of other heart-valve replacement: Secondary | ICD-10-CM

## 2013-08-28 NOTE — Patient Instructions (Addendum)
Your physician recommends that you schedule a follow-up appointment in: 6 months   Dr [name] cleared you for your surgery.

## 2013-08-28 NOTE — Progress Notes (Signed)
He returns today for followup of her 2-D echo and Myoview stress test. Echo revealed normal LV systolic function and a normal functioning mechanical aortic prosthesis. Her Myoview showed anteroapical ischemia which was read as "high risk". She did have a Myoview performed in 2006, one year after bypass surgery, which led to cardiac catheterization revealing patent grafts suggesting a false positive study. Based on this, should she elect to proceed with a surgical procedure, she will be cleared at low risk. I will see him back in 6 months.  Lorretta Harp, M.D., [place]. [place], [place]  [id number]  [phone] 08/28/2013 3:23 PM

## 2013-08-29 ENCOUNTER — Encounter (HOSPITAL_COMMUNITY): Admission: RE | Payer: Self-pay | Source: Ambulatory Visit

## 2013-08-29 ENCOUNTER — Inpatient Hospital Stay (HOSPITAL_COMMUNITY): Admission: RE | Admit: 2013-08-29 | Payer: Medicare Other | Source: Ambulatory Visit | Admitting: Orthopaedic Surgery

## 2013-08-29 SURGERY — ARTHROPLASTY, HIP, TOTAL, ANTERIOR APPROACH
Anesthesia: Choice | Site: Hip | Laterality: Right

## 2013-09-01 ENCOUNTER — Ambulatory Visit (HOSPITAL_COMMUNITY): Payer: Medicare Other

## 2013-09-15 ENCOUNTER — Ambulatory Visit (INDEPENDENT_AMBULATORY_CARE_PROVIDER_SITE_OTHER): Payer: Medicare Other | Admitting: Pharmacist Clinician (PhC)/ Clinical Pharmacy Specialist

## 2013-09-15 VITALS — BP 152/60 | HR 56

## 2013-09-15 DIAGNOSIS — Z954 Presence of other heart-valve replacement: Secondary | ICD-10-CM

## 2013-09-15 DIAGNOSIS — Z952 Presence of prosthetic heart valve: Secondary | ICD-10-CM

## 2013-09-15 DIAGNOSIS — Z7901 Long term (current) use of anticoagulants: Secondary | ICD-10-CM

## 2013-09-15 LAB — POCT INR: INR: 3.5

## 2013-09-24 ENCOUNTER — Encounter (HOSPITAL_COMMUNITY): Payer: Self-pay | Admitting: *Deleted

## 2013-10-06 ENCOUNTER — Ambulatory Visit (HOSPITAL_COMMUNITY): Payer: Medicare Other

## 2013-10-27 ENCOUNTER — Ambulatory Visit (HOSPITAL_COMMUNITY)
Admission: RE | Admit: 2013-10-27 | Discharge: 2013-10-27 | Disposition: A | Discharge status: Home / Self Care | Payer: Medicare Other | Source: Ambulatory Visit | Attending: Internal Medicine | Admitting: Internal Medicine

## 2013-10-27 ENCOUNTER — Ambulatory Visit: Payer: Medicare Other | Admitting: Pharmacist Clinician (PhC)/ Clinical Pharmacy Specialist

## 2013-10-27 ENCOUNTER — Ambulatory Visit (INDEPENDENT_AMBULATORY_CARE_PROVIDER_SITE_OTHER): Payer: Medicare Other | Admitting: Pharmacist Clinician (PhC)/ Clinical Pharmacy Specialist

## 2013-10-27 VITALS — BP 140/50 | HR 68

## 2013-10-27 DIAGNOSIS — I70219 Atherosclerosis of native arteries of extremities with intermittent claudication, unspecified extremity: Secondary | ICD-10-CM

## 2013-10-27 DIAGNOSIS — Z09 Encounter for follow-up examination after completed treatment for conditions other than malignant neoplasm: Secondary | ICD-10-CM | POA: Insufficient documentation

## 2013-10-27 DIAGNOSIS — Z952 Presence of prosthetic heart valve: Secondary | ICD-10-CM

## 2013-10-27 DIAGNOSIS — I739 Peripheral vascular disease, unspecified: Secondary | ICD-10-CM

## 2013-10-27 DIAGNOSIS — Z9889 Other specified postprocedural states: Secondary | ICD-10-CM | POA: Insufficient documentation

## 2013-10-27 DIAGNOSIS — Z7901 Long term (current) use of anticoagulants: Secondary | ICD-10-CM

## 2013-10-27 DIAGNOSIS — Z954 Presence of other heart-valve replacement: Secondary | ICD-10-CM

## 2013-10-27 LAB — POCT INR: INR: 3

## 2013-10-27 NOTE — Progress Notes (Signed)
Arterial Duplex Lower Ext. Completed. Yon Schiffman, BS, RDMS, RVT  

## 2013-11-11 ENCOUNTER — Encounter: Payer: Self-pay | Admitting: *Deleted

## 2013-11-11 ENCOUNTER — Telehealth: Payer: Self-pay | Admitting: *Deleted

## 2013-11-11 DIAGNOSIS — I739 Peripheral vascular disease, unspecified: Secondary | ICD-10-CM

## 2013-11-11 NOTE — Telephone Encounter (Signed)
Order placed for repeat lower ext dopplers in 1 year

## 2013-11-11 NOTE — Telephone Encounter (Signed)
Message copied by Chauncy Lean on Tue Nov 11, 2013 10:22 PM ------      Message from: Lorretta Harp      Created: Mon Nov 10, 2013  8:27 PM       No change from prior study. Repeat in 12 months. ------

## 2013-12-05 ENCOUNTER — Encounter (HOSPITAL_COMMUNITY): Payer: Medicare Other

## 2013-12-08 ENCOUNTER — Ambulatory Visit (INDEPENDENT_AMBULATORY_CARE_PROVIDER_SITE_OTHER): Payer: Medicare Other | Admitting: Pharmacist Clinician (PhC)/ Clinical Pharmacy Specialist

## 2013-12-08 VITALS — BP 134/50 | HR 64

## 2013-12-08 DIAGNOSIS — Z952 Presence of prosthetic heart valve: Secondary | ICD-10-CM

## 2013-12-08 DIAGNOSIS — Z7901 Long term (current) use of anticoagulants: Secondary | ICD-10-CM

## 2013-12-08 DIAGNOSIS — Z954 Presence of other heart-valve replacement: Secondary | ICD-10-CM

## 2013-12-08 LAB — POCT INR: INR: 2.4

## 2014-01-05 ENCOUNTER — Other Ambulatory Visit: Payer: Self-pay | Admitting: Family Medicine

## 2014-01-05 DIAGNOSIS — M8580 Other specified disorders of bone density and structure, unspecified site: Secondary | ICD-10-CM

## 2014-01-09 ENCOUNTER — Ambulatory Visit
Admission: RE | Admit: 2014-01-09 | Discharge: 2014-01-09 | Disposition: A | Discharge status: Home / Self Care | Payer: Medicare Other | Source: Ambulatory Visit | Attending: Family Medicine | Admitting: Family Medicine

## 2014-01-09 DIAGNOSIS — M8580 Other specified disorders of bone density and structure, unspecified site: Secondary | ICD-10-CM

## 2014-01-09 DIAGNOSIS — Z1231 Encounter for screening mammogram for malignant neoplasm of breast: Secondary | ICD-10-CM

## 2014-01-19 ENCOUNTER — Ambulatory Visit (INDEPENDENT_AMBULATORY_CARE_PROVIDER_SITE_OTHER): Payer: Medicare Other | Admitting: Pharmacist Clinician (PhC)/ Clinical Pharmacy Specialist

## 2014-01-19 DIAGNOSIS — Z952 Presence of prosthetic heart valve: Secondary | ICD-10-CM

## 2014-01-19 DIAGNOSIS — Z7901 Long term (current) use of anticoagulants: Secondary | ICD-10-CM

## 2014-01-19 DIAGNOSIS — Z954 Presence of other heart-valve replacement: Secondary | ICD-10-CM

## 2014-01-19 LAB — POCT INR: INR: 3

## 2014-01-20 ENCOUNTER — Other Ambulatory Visit: Payer: Self-pay | Admitting: Cardiovascular Disease

## 2014-01-21 ENCOUNTER — Other Ambulatory Visit: Payer: Self-pay | Admitting: *Deleted

## 2014-01-21 NOTE — Telephone Encounter (Signed)
Per patient she takes Hydralazine 50mg  bid.  Message left at  Pharmacy to take the tid dosing hydralazine off her profile and add 6 refills on the bid dosing hydralazine.

## 2014-01-30 ENCOUNTER — Other Ambulatory Visit: Payer: Self-pay | Admitting: Pharmacist Clinician (PhC)/ Clinical Pharmacy Specialist

## 2014-01-30 MED ORDER — WARFARIN SODIUM 5 MG PO TABS: ORAL_TABLET | ORAL | Status: DC

## 2014-03-04 ENCOUNTER — Telehealth: Payer: Self-pay | Admitting: Pharmacist Clinician (PhC)/ Clinical Pharmacy Specialist

## 2014-03-04 ENCOUNTER — Ambulatory Visit (INDEPENDENT_AMBULATORY_CARE_PROVIDER_SITE_OTHER): Payer: Medicare Other | Admitting: Pharmacist Clinician (PhC)/ Clinical Pharmacy Specialist

## 2014-03-04 DIAGNOSIS — Z954 Presence of other heart-valve replacement: Secondary | ICD-10-CM

## 2014-03-04 DIAGNOSIS — Z952 Presence of prosthetic heart valve: Secondary | ICD-10-CM

## 2014-03-04 DIAGNOSIS — Z7901 Long term (current) use of anticoagulants: Secondary | ICD-10-CM

## 2014-03-04 DIAGNOSIS — I10 Essential (primary) hypertension: Secondary | ICD-10-CM

## 2014-03-04 LAB — POCT INR: INR: 3.2

## 2014-03-04 MED ORDER — HYDRALAZINE HCL 50 MG PO TABS: 50.0000 mg | ORAL_TABLET | Freq: Two times a day (BID) | ORAL | Status: DC

## 2014-03-04 MED ORDER — CHLORTHALIDONE 25 MG PO TABS: 25.0000 mg | ORAL_TABLET | Freq: Every day | ORAL | Status: DC

## 2014-03-04 NOTE — Telephone Encounter (Signed)
Pt in office for coumadin check, BP 170/56.  Pt stated her concern that BP has been high for awhile, was [id number] systolic when she saw PCP recently.   When asked if PCP made any med changes, she states was told that this was for cardiology to monitor.  Currently takes hydralazine 50mg  bid, and irbesartan 300mg  qd, as well as metoprolol 25mg  bid.  She has previously tried amlodipine, which caused swelling in her ankles.  She is upset to be on multiple medications, as in the past "it only took 1 medication, why can't I have that one again".  She couldn't recall name of med.  I explained that average of 3 meds are needed for most people to control BP.  Pulled her chart after INR visit.  Looks as though she was on lisinopril for many years, although the dose went from 10 to 40 mg, before being switched to an ARB due to poor BP control.

## 2014-03-06 MED ORDER — HYDRALAZINE HCL 50 MG PO TABS: 50.0000 mg | ORAL_TABLET | Freq: Three times a day (TID) | ORAL | Status: DC

## 2014-03-06 NOTE — Telephone Encounter (Signed)
Spoke with patient - reviewed options for BP management.  At this time will not try to add to her burden, but instead increase hydralazine to 50mg  tid, or 100mg  qam, 50mg  qpm if she cannot get the mid-day dose in regularly.  Pt agreed to plan.

## 2014-03-23 ENCOUNTER — Other Ambulatory Visit: Payer: Self-pay | Admitting: Family Medicine

## 2014-03-23 DIAGNOSIS — R1011 Right upper quadrant pain: Secondary | ICD-10-CM

## 2014-03-30 ENCOUNTER — Other Ambulatory Visit: Payer: Self-pay

## 2014-03-30 ENCOUNTER — Ambulatory Visit
Admission: RE | Admit: 2014-03-30 | Discharge: 2014-03-30 | Disposition: A | Discharge status: Home / Self Care | Payer: Medicare Other | Source: Ambulatory Visit | Attending: Family Medicine | Admitting: Family Medicine

## 2014-03-30 DIAGNOSIS — R1011 Right upper quadrant pain: Secondary | ICD-10-CM

## 2014-03-30 MED ORDER — IRBESARTAN 300 MG PO TABS: 300.0000 mg | ORAL_TABLET | Freq: Every day | ORAL | Status: DC

## 2014-03-30 NOTE — Telephone Encounter (Signed)
Rx was sent to pharmacy electronically. 

## 2014-04-06 ENCOUNTER — Ambulatory Visit (INDEPENDENT_AMBULATORY_CARE_PROVIDER_SITE_OTHER): Payer: Medicare Other | Admitting: General Surgery

## 2014-04-06 ENCOUNTER — Telehealth: Payer: Self-pay | Admitting: Pharmacist Clinician (PhC)/ Clinical Pharmacy Specialist

## 2014-04-08 ENCOUNTER — Ambulatory Visit: Payer: Medicare Other | Admitting: Pharmacist Clinician (PhC)/ Clinical Pharmacy Specialist

## 2014-04-08 NOTE — Telephone Encounter (Signed)
Closed encounter °

## 2014-04-15 ENCOUNTER — Ambulatory Visit (INDEPENDENT_AMBULATORY_CARE_PROVIDER_SITE_OTHER): Payer: Medicare Other | Admitting: Pharmacist

## 2014-04-15 DIAGNOSIS — Z952 Presence of prosthetic heart valve: Secondary | ICD-10-CM

## 2014-04-15 DIAGNOSIS — Z7901 Long term (current) use of anticoagulants: Secondary | ICD-10-CM

## 2014-04-15 DIAGNOSIS — Z954 Presence of other heart-valve replacement: Secondary | ICD-10-CM

## 2014-04-15 LAB — POCT INR: INR: 2.8

## 2014-04-29 ENCOUNTER — Ambulatory Visit: Payer: Self-pay | Admitting: Podiatry

## 2014-05-13 ENCOUNTER — Ambulatory Visit (INDEPENDENT_AMBULATORY_CARE_PROVIDER_SITE_OTHER): Payer: Medicare Other | Admitting: Podiatry

## 2014-05-13 ENCOUNTER — Encounter: Payer: Self-pay | Admitting: Podiatry

## 2014-05-13 VITALS — BP 138/61 | HR 55 | Resp 16 | Ht 61.0 in | Wt 159.0 lb

## 2014-05-13 DIAGNOSIS — L600 Ingrowing nail: Secondary | ICD-10-CM

## 2014-05-13 DIAGNOSIS — B351 Tinea unguium: Secondary | ICD-10-CM

## 2014-05-13 NOTE — Progress Notes (Signed)
Subjective:     Patient ID: [name], female   DOB: May 14, 1941, 73 y.o.   MRN: [id number]  Toe Pain    patient presents with painful ingrown toenails of both feet stating the right hurts more and also that the left nail has been giving her trouble. States she's tried to trim and soak   Review of Systems  All other systems reviewed and are negative.      Objective:   Physical Exam  Nursing note and vitals reviewed. Constitutional: She is oriented to person, place, and time.  Cardiovascular: Intact distal pulses.   Musculoskeletal: Normal range of motion.  Neurological: She is oriented to person, place, and time.  Skin: Skin is dry.   neurovascular status found to be intact with muscle strength adequate and range of motion subtalar midtarsal joint within normal limits. Patient's found to have well-perfused toes and well-developed arch upon weightbearing and is noted on the right hallux to have incurvation of the lateral border and on the left hallux a thick medial border that's painful    Assessment:     Ingrown toenail deformity of the hallux of both feet and damage to the left hallux nail bed    Plan:     H&P and condition explained discussed. I've recommended permanent removal of corners explaining risk and patient wants surgery. I infiltrated each hallux 60 mg Xylocaine Marcaine mixture remove the lateral border right hallux and the medial border left hallux applying phenol 3 applications 30 seconds followed by alcohol lavaged and sterile dressing.

## 2014-05-13 NOTE — Progress Notes (Signed)
   Subjective:    Patient ID: [name], female    DOB: [birth year], 73 y.o.   MRN: [id number]  HPI Comments: "I have an ingrown"  Patient c/o tenderness 1st toe right, lateral border, for a few months. It did get infected and she was RX'd antibiotic by PCP that she has now completed.Better but still sore.  Toe Pain       Review of Systems  Constitutional: Positive for unexpected weight change.  HENT: Positive for hearing loss and tinnitus.   Gastrointestinal: Positive for abdominal pain.  Genitourinary: Positive for difficulty urinating.  Musculoskeletal: Positive for arthralgias and gait problem.  Neurological: Positive for light-headedness.  Hematological: Bruises/bleeds easily.  All other systems reviewed and are negative.      Objective:   Physical Exam        Assessment & Plan:

## 2014-05-13 NOTE — Patient Instructions (Addendum)
Betadine Soak Instructions  Purchase an 8 oz. bottle of BETADINE solution (Povidone)  THE DAY AFTER THE PROCEDURE  Place 1 tablespoon of betadine solution in a quart of warm tap water.  Submerge your foot or feet with outer bandage intact for the initial soak; this will allow the bandage to become moist and wet for easy lift off.  Once you remove your bandage, continue to soak in the solution for 20 minutes.  This soak should be done twice a day.  Next, remove your foot or feet from solution, blot dry the affected area and cover.  You may use a band aid large enough to cover the area or use gauze and tape.  Apply other medications to the area as directed by the doctor such as cortisporin otic solution (ear drops) or neosporin.  IF YOUR SKIN BECOMES IRRITATED WHILE USING THESE INSTRUCTIONS, IT IS OKAY TO SWITCH TO EPSOM SALTS AND WATER OR WHITE VINEGAR AND WATER.   Long Term Care Instructions-Post Nail Surgery  You have had your ingrown toenail and root treated with a chemical.  This chemical causes a burn that will drain and ooze like a blister.  This can drain for 6-8 weeks or longer.  It is important to keep this area clean, covered, and follow the soaking instructions dispensed at the time of your surgery.  This area will eventually dry and form a scab.  Once the scab forms you no longer need to soak or apply a dressing.  If at any time you experience an increase in pain, redness, swelling, or drainage, you should contact the office as soon as possible.  

## 2014-05-20 ENCOUNTER — Telehealth: Payer: Self-pay | Admitting: *Deleted

## 2014-05-20 NOTE — Telephone Encounter (Signed)
You worked on my toe last week.  It won't heal.  I put some peroxide on it and it just bubbled.  So apparently it is infected.  Can you call me in an antibiotic?  I asked her which toe she said it's the nail he cut the fungus out of.  The other nail is doing okay.  I asked if it was pus.  She stated yes and it's really red.  I told her to soak it in the same solution as the right one and put some triple antibiotic ointment on it with a bandage.  She said she has been putting the ointment on it.  I told her I would ask Dr. Paulla Dolly in the morning and see what he says.  Then call you with a response.  She stated okay.

## 2014-05-22 NOTE — Telephone Encounter (Signed)
Cephalexin 500mg  #20 bid unless she is allergic

## 2014-05-25 NOTE — Telephone Encounter (Signed)
I called and asked her how her toe was doing.  She stated, it is much better.  It seems to have gotten better in the last 2-3 days.  I asked her it she feels like she needs an antibiotic.  Dr. Paulla Dolly said he could prescribe you one if you need it. She stated, no I don't think I need it.  If it gets worse I will call and let you know.

## 2014-05-27 ENCOUNTER — Ambulatory Visit (INDEPENDENT_AMBULATORY_CARE_PROVIDER_SITE_OTHER): Payer: Medicare Other | Admitting: Pharmacist Clinician (PhC)/ Clinical Pharmacy Specialist

## 2014-05-27 DIAGNOSIS — Z954 Presence of other heart-valve replacement: Secondary | ICD-10-CM

## 2014-05-27 DIAGNOSIS — Z7901 Long term (current) use of anticoagulants: Secondary | ICD-10-CM

## 2014-05-27 DIAGNOSIS — Z952 Presence of prosthetic heart valve: Secondary | ICD-10-CM

## 2014-05-27 LAB — POCT INR: INR: 3.9

## 2014-06-12 ENCOUNTER — Telehealth: Payer: Self-pay | Admitting: Pharmacist Clinician (PhC)/ Clinical Pharmacy Specialist

## 2014-06-12 NOTE — Telephone Encounter (Signed)
Pt LMOM, stating has UTI, will be starting cipro bid tonight for 5-10 days.  Needs to know if should have INR check  Returned call, pt has been on cipro in past, cannot recall being a problem, has appt already set for next Friday.  Will have her keep that appt, call if notices any increase in bleeding/bruising

## 2014-06-19 ENCOUNTER — Ambulatory Visit (INDEPENDENT_AMBULATORY_CARE_PROVIDER_SITE_OTHER): Payer: Medicare Other | Admitting: Pharmacist Clinician (PhC)/ Clinical Pharmacy Specialist

## 2014-06-19 DIAGNOSIS — Z954 Presence of other heart-valve replacement: Secondary | ICD-10-CM

## 2014-06-19 DIAGNOSIS — Z952 Presence of prosthetic heart valve: Secondary | ICD-10-CM

## 2014-06-19 DIAGNOSIS — Z7901 Long term (current) use of anticoagulants: Secondary | ICD-10-CM

## 2014-06-19 LAB — POCT INR: INR: 2.8

## 2014-07-06 ENCOUNTER — Other Ambulatory Visit: Payer: Self-pay | Admitting: Pharmacist Clinician (PhC)/ Clinical Pharmacy Specialist

## 2014-07-17 ENCOUNTER — Ambulatory Visit (INDEPENDENT_AMBULATORY_CARE_PROVIDER_SITE_OTHER): Payer: Medicare Other | Admitting: Pharmacist Clinician (PhC)/ Clinical Pharmacy Specialist

## 2014-07-17 VITALS — BP 160/60

## 2014-07-17 DIAGNOSIS — Z954 Presence of other heart-valve replacement: Secondary | ICD-10-CM

## 2014-07-17 DIAGNOSIS — Z952 Presence of prosthetic heart valve: Secondary | ICD-10-CM

## 2014-07-17 DIAGNOSIS — Z7901 Long term (current) use of anticoagulants: Secondary | ICD-10-CM

## 2014-07-17 LAB — POCT INR: INR: 3.4

## 2014-08-28 ENCOUNTER — Ambulatory Visit (INDEPENDENT_AMBULATORY_CARE_PROVIDER_SITE_OTHER): Payer: Medicare Other | Admitting: Pharmacist Clinician (PhC)/ Clinical Pharmacy Specialist

## 2014-08-28 DIAGNOSIS — Z952 Presence of prosthetic heart valve: Secondary | ICD-10-CM

## 2014-08-28 DIAGNOSIS — Z954 Presence of other heart-valve replacement: Secondary | ICD-10-CM

## 2014-08-28 DIAGNOSIS — Z7901 Long term (current) use of anticoagulants: Secondary | ICD-10-CM

## 2014-08-28 LAB — POCT INR: INR: 3.4

## 2014-10-07 ENCOUNTER — Ambulatory Visit (INDEPENDENT_AMBULATORY_CARE_PROVIDER_SITE_OTHER): Payer: Medicare Other | Admitting: Pharmacist Clinician (PhC)/ Clinical Pharmacy Specialist

## 2014-10-07 DIAGNOSIS — Z7901 Long term (current) use of anticoagulants: Secondary | ICD-10-CM

## 2014-10-07 DIAGNOSIS — Z952 Presence of prosthetic heart valve: Secondary | ICD-10-CM

## 2014-10-07 DIAGNOSIS — Z954 Presence of other heart-valve replacement: Secondary | ICD-10-CM

## 2014-10-07 LAB — POCT INR: INR: 4.1

## 2014-10-27 ENCOUNTER — Other Ambulatory Visit: Payer: Self-pay | Admitting: Cardiovascular Disease

## 2014-10-28 NOTE — Telephone Encounter (Signed)
Rx refill sent to patient pharmacy  With note to make appt. Patient last OV was 08/2013

## 2014-11-04 ENCOUNTER — Ambulatory Visit (INDEPENDENT_AMBULATORY_CARE_PROVIDER_SITE_OTHER): Payer: Commercial Managed Care - HMO | Admitting: Pharmacist Clinician (PhC)/ Clinical Pharmacy Specialist

## 2014-11-04 DIAGNOSIS — Z7901 Long term (current) use of anticoagulants: Secondary | ICD-10-CM

## 2014-11-04 DIAGNOSIS — Z952 Presence of prosthetic heart valve: Secondary | ICD-10-CM

## 2014-11-04 DIAGNOSIS — Z954 Presence of other heart-valve replacement: Secondary | ICD-10-CM

## 2014-11-04 LAB — POCT INR: INR: 3.4

## 2014-11-27 ENCOUNTER — Other Ambulatory Visit: Payer: Self-pay | Admitting: Cardiovascular Disease

## 2014-11-27 NOTE — Telephone Encounter (Signed)
Rx(s) sent to pharmacy electronically.  

## 2014-12-02 ENCOUNTER — Ambulatory Visit: Payer: Commercial Managed Care - HMO | Admitting: Pharmacist Clinician (PhC)/ Clinical Pharmacy Specialist

## 2014-12-05 ENCOUNTER — Other Ambulatory Visit: Payer: Self-pay | Admitting: Cardiovascular Disease

## 2014-12-07 ENCOUNTER — Ambulatory Visit (INDEPENDENT_AMBULATORY_CARE_PROVIDER_SITE_OTHER): Payer: Commercial Managed Care - HMO | Admitting: Pharmacist Clinician (PhC)/ Clinical Pharmacy Specialist

## 2014-12-07 DIAGNOSIS — Z7901 Long term (current) use of anticoagulants: Secondary | ICD-10-CM | POA: Diagnosis not present

## 2014-12-07 DIAGNOSIS — Z954 Presence of other heart-valve replacement: Secondary | ICD-10-CM | POA: Diagnosis not present

## 2014-12-07 DIAGNOSIS — Z952 Presence of prosthetic heart valve: Secondary | ICD-10-CM

## 2014-12-07 LAB — POCT INR: INR: 3.3

## 2014-12-08 ENCOUNTER — Encounter: Payer: Self-pay | Admitting: *Deleted

## 2014-12-11 ENCOUNTER — Ambulatory Visit (INDEPENDENT_AMBULATORY_CARE_PROVIDER_SITE_OTHER): Payer: Commercial Managed Care - HMO | Admitting: Cardiovascular Disease

## 2014-12-11 ENCOUNTER — Encounter: Payer: Self-pay | Admitting: Cardiovascular Disease

## 2014-12-11 VITALS — BP 130/60 | HR 57 | Ht 61.0 in | Wt 151.2 lb

## 2014-12-11 DIAGNOSIS — Z79899 Other long term (current) drug therapy: Secondary | ICD-10-CM

## 2014-12-11 DIAGNOSIS — I779 Disorder of arteries and arterioles, unspecified: Secondary | ICD-10-CM

## 2014-12-11 DIAGNOSIS — E7800 Pure hypercholesterolemia, unspecified: Secondary | ICD-10-CM

## 2014-12-11 DIAGNOSIS — I10 Essential (primary) hypertension: Secondary | ICD-10-CM | POA: Diagnosis not present

## 2014-12-11 DIAGNOSIS — Z952 Presence of prosthetic heart valve: Secondary | ICD-10-CM

## 2014-12-11 DIAGNOSIS — Z954 Presence of other heart-valve replacement: Secondary | ICD-10-CM | POA: Diagnosis not present

## 2014-12-11 DIAGNOSIS — I2510 Atherosclerotic heart disease of native coronary artery without angina pectoris: Secondary | ICD-10-CM | POA: Diagnosis not present

## 2014-12-11 DIAGNOSIS — I739 Peripheral vascular disease, unspecified: Secondary | ICD-10-CM

## 2014-12-11 DIAGNOSIS — I2583 Coronary atherosclerosis due to lipid rich plaque: Secondary | ICD-10-CM

## 2014-12-11 NOTE — Assessment & Plan Note (Signed)
History of aortic stenosis status post St. Jude aortic valve replacement 07/13/04 along with coronary artery bypass grafting. If there were a year since her last 2-D echo which I will recheck.

## 2014-12-11 NOTE — Assessment & Plan Note (Signed)
History of hypertension with blood pressure measured 130/60. She is on Avapro, metoprolol and hydralazine. Continue current meds at current dosing

## 2014-12-11 NOTE — Assessment & Plan Note (Signed)
History of carotid artery disease with duplex evidence of moderate bilateral ICA stenosis. She is neurologically symptomatically. We will recheck carotid Doppler studies

## 2014-12-11 NOTE — Progress Notes (Signed)
12/11/2014 [name]   13-Nov-1940  [id number]  Primary Physician Antony Blackbird, MD Primary Cardiologist: Lorretta Harp MD Renae Gloss   HPI:  The patient is a 74 year old, mildly overweight, widowed Caucasian female mother of 58, grandmother to 64 grandchildren whose daughter unfortunately recently died of ALS after a long protracted course. I last saw her in the office 08/28/13.The patient has a history of CAD and valvular heart disease as well as PVOD. She is status post coronary artery bypass grafting x2 with a St. Jude AVR July 13, 2004. Her other problems include hypertension, hyperlipidemia, diabetes, and discontinued tobacco abuse. I dilated her left common iliac and redilated her for in-stent restenosis in 2006. We have been following her lower extremity and carotid Dopplers since that time. She does have mild to moderate right and mild left ICA stenosis which has remained stable. She is neurologically asymptomatic. She denies chest pain, shortness of breath or claudication. [name] follows her lipid profile which was done most recently in March revealing a total cholesterol of 106, LDL of 53 and HDL of 29. Her most recent carotid Doppler in June did show moderate right and moderately severe left ICA stenosis which had not changed, and her lower extremity Dopplers revealed ABIs of 1 bilaterally with bilateral iliac disease. Since I saw her last a year ago she's remained stable and is asymptomatic otherwise. She does give right total hip replacement which is scheduled to be performed in Dr. Jean Rosenthal next week. I'm going to get a 2-D echocardiogram to assess her aortic prosthesis as well as a pharmacologic Myoview stress test to stratify her symptoms been over a year since her last functional study and she is a diabetic of 10 years post bypass grafting.she did have a high-risk Myoview back in 2014 that she had a false positive Myoview back in 2006 after her  bypass surgery as well. She denies chest pain or shortness of breath. She ultimately never had her hip replacement.   Current Outpatient Prescriptions  Medication Sig Dispense Refill  . ALPRAZolam (XANAX) 0.5 MG tablet Take 0.25-0.5 mg by mouth 2 (two) times daily as needed for anxiety. For anxiety    . aspirin EC 81 MG tablet Take 81 mg by mouth daily. Patient only takes on Mon Wed and Friday    . atorvastatin (LIPITOR) 80 MG tablet Take 80 mg by mouth daily.     . Cholecalciferol (VITAMIN D3) 1000 UNITS CAPS Take 1 capsule by mouth daily.    . furosemide (LASIX) 40 MG tablet Take 60 mg by mouth daily. Takes 1 and 1/2 tablets of the 40mg  daily    . hydrALAZINE (APRESOLINE) 50 MG tablet Take 1 tablet (50 mg total) by mouth 3 (three) times daily. 180 tablet 1  . irbesartan (AVAPRO) 300 MG tablet TAKE 1 TABLET (300 MG TOTAL) BY MOUTH DAILY. MUST KEEP APPOINTMENT 12/11/2014 WITH DR Saket Hellstrom FOR FUTURE REFILLS. 30 tablet 0  . metFORMIN (GLUCOPHAGE) 500 MG tablet Take 500 mg by mouth 2 (two) times daily with a meal.     . metoprolol (LOPRESSOR) 50 MG tablet Take 25 mg by mouth 2 (two) times daily.     . Pyridoxine HCl (VITAMIN B-6 PO) Take 1 tablet by mouth daily.    . traMADol (ULTRAM) 50 MG tablet Take 50 mg by mouth every 6 (six) hours as needed for moderate pain or severe pain.     . vitamin B-12 (CYANOCOBALAMIN) 100 MCG tablet Take  100 mcg by mouth daily.     [name] warfarin (COUMADIN) 5 MG tablet Take 1 to 1.5 tablets by mouth daily as directed by coumadin clinic 120 tablet 1   No current facility-administered medications for this visit.    Allergies  Allergen Reactions  . Losartan Swelling  . Verapamil Hives  . Zetia [Ezetimibe] Other (See Comments)    Reaction=unknown  . Zocor [Simvastatin - High Dose]     History   Social History  . Marital Status: Widowed    Spouse Name: N/A  . Number of Children: N/A  . Years of Education: N/A   Occupational History  . Not on file.   Social  History Main Topics  . Smoking status: Former Smoker -- 1.00 packs/day for 30 years    Types: Cigarettes  . Smokeless tobacco: Not on file  . Alcohol Use: No  . Drug Use: No  . Sexual Activity: Not on file   Other Topics Concern  . Not on file   Social History Narrative     Review of Systems: General: negative for chills, fever, night sweats or weight changes.  Cardiovascular: negative for chest pain, dyspnea on exertion, edema, orthopnea, palpitations, paroxysmal nocturnal dyspnea or shortness of breath Dermatological: negative for rash Respiratory: negative for cough or wheezing Urologic: negative for hematuria Abdominal: negative for nausea, vomiting, diarrhea, bright red blood per rectum, melena, or hematemesis Neurologic: negative for visual changes, syncope, or dizziness All other systems reviewed and are otherwise negative except as noted above.    Blood pressure 130/60, pulse 57, height 5\' 1"  (1.549 m), weight 151 lb 3.2 oz (68.584 kg).  General appearance: alert and no distress Neck: no adenopathy, no JVD, supple, symmetrical, trachea midline, thyroid not enlarged, symmetric, no tenderness/mass/nodules and bilateral carotid bruits Lungs: clear to auscultation bilaterally Heart: crisp prosthetic valve sounds Extremities: extremities normal, atraumatic, no cyanosis or edema  EKG sinus bradycardia 57 without ST or T-wave changes. Upper symptoms reviewed this EKG  ASSESSMENT AND PLAN:   Peripheral arterial disease Status post left common iliac artery stent with redilatation in 2006. Her most recent lower extremity arterial Doppler studies performed 10/27/13 revealed ABIs of 1 bilaterally with a mildly elevated velocity in the right common iliac artery. The patient denies claudication. We will recheck lower extremity arterial Doppler studies.   Mechanical heart valve present History of aortic stenosis status post St. Jude aortic valve replacement 07/13/04 along with  coronary artery bypass grafting. If there were a year since her last 2-D echo which I will recheck.   Hypertension History of hypertension with blood pressure measured 130/60. She is on Avapro, metoprolol and hydralazine. Continue current meds at current dosing   Hypercholesteremia History of hyperlipidemia on Lipitor 80 mg a day. We will recheck a lipid and liver profile   Coronary artery disease History of coronary artery disease status post bypass grafting in 2005 at the time of aortic valve replacement. She is currently asymptomatic   Carotid artery disease History of carotid artery disease with duplex evidence of moderate bilateral ICA stenosis. She is neurologically symptomatically. We will recheck carotid Doppler studies       Lorretta Harp MD [facility], [facility] 12/11/2014 3:01 PM

## 2014-12-11 NOTE — Patient Instructions (Signed)
Echocardiogram. Echocardiography is a painless test that uses sound waves to create images of your heart. It provides your doctor with information about the size and shape of your heart and how well your heart's chambers and valves are working. This procedure takes approximately one hour. There are no restrictions for this procedure.  Lower extremity arterial doppler- During this test, ultrasound is used to evaluate arterial blood flow in the legs. Allow approximately one hour for this exam.   Carotid Doppler- This test is an ultrasound of the carotid arteries in your neck. It looks at blood flow through these arteries that supply the brain with blood. Allow one hour for this exam. There are no restrictions or special instructions.  Your physician wants you to follow-up in 1 year with Dr. [name]. You will receive a reminder letter in the mail 2 months in advance. If you do not receive a letter, please call our office to schedule the follow-up appointment.  Dr. [name] has ordered for you to have lab work done in the next few days and you need to be FASTING.

## 2014-12-11 NOTE — Assessment & Plan Note (Signed)
History of hyperlipidemia on Lipitor 80 mg a day. We will recheck a lipid and liver profile

## 2014-12-11 NOTE — Assessment & Plan Note (Signed)
Status post left common iliac artery stent with redilatation in 2006. Her most recent lower extremity arterial Doppler studies performed 10/27/13 revealed ABIs of 1 bilaterally with a mildly elevated velocity in the right common iliac artery. The patient denies claudication. We will recheck lower extremity arterial Doppler studies.

## 2014-12-11 NOTE — Assessment & Plan Note (Signed)
History of coronary artery disease status post bypass grafting in 2005 at the time of aortic valve replacement. She is currently asymptomatic

## 2014-12-15 DIAGNOSIS — E119 Type 2 diabetes mellitus without complications: Secondary | ICD-10-CM | POA: Diagnosis not present

## 2014-12-15 DIAGNOSIS — Z79899 Other long term (current) drug therapy: Secondary | ICD-10-CM | POA: Diagnosis not present

## 2014-12-15 DIAGNOSIS — R8299 Other abnormal findings in urine: Secondary | ICD-10-CM | POA: Diagnosis not present

## 2014-12-15 DIAGNOSIS — E780 Pure hypercholesterolemia: Secondary | ICD-10-CM | POA: Diagnosis not present

## 2014-12-15 DIAGNOSIS — E782 Mixed hyperlipidemia: Secondary | ICD-10-CM | POA: Diagnosis not present

## 2014-12-15 DIAGNOSIS — M859 Disorder of bone density and structure, unspecified: Secondary | ICD-10-CM | POA: Diagnosis not present

## 2014-12-15 DIAGNOSIS — I10 Essential (primary) hypertension: Secondary | ICD-10-CM | POA: Diagnosis not present

## 2014-12-15 DIAGNOSIS — E559 Vitamin D deficiency, unspecified: Secondary | ICD-10-CM | POA: Diagnosis not present

## 2014-12-15 DIAGNOSIS — M109 Gout, unspecified: Secondary | ICD-10-CM | POA: Diagnosis not present

## 2014-12-18 ENCOUNTER — Encounter (HOSPITAL_COMMUNITY): Payer: Self-pay | Admitting: *Deleted

## 2014-12-22 ENCOUNTER — Encounter: Payer: Self-pay | Admitting: Cardiovascular Disease

## 2014-12-25 ENCOUNTER — Ambulatory Visit (HOSPITAL_COMMUNITY)
Admission: RE | Admit: 2014-12-25 | Discharge: 2014-12-25 | Disposition: A | Discharge status: Home / Self Care | Payer: Commercial Managed Care - HMO | Source: Ambulatory Visit | Attending: Cardiovascular Disease | Admitting: Cardiovascular Disease

## 2014-12-25 ENCOUNTER — Encounter (HOSPITAL_COMMUNITY): Payer: Commercial Managed Care - HMO

## 2014-12-25 DIAGNOSIS — I70203 Unspecified atherosclerosis of native arteries of extremities, bilateral legs: Secondary | ICD-10-CM | POA: Diagnosis not present

## 2014-12-25 DIAGNOSIS — I739 Peripheral vascular disease, unspecified: Secondary | ICD-10-CM

## 2014-12-25 NOTE — Progress Notes (Signed)
Lower extremity arterial duplex completed. °Brianna L Mazza,RVT °

## 2014-12-28 ENCOUNTER — Encounter: Payer: Self-pay | Admitting: *Deleted

## 2014-12-30 ENCOUNTER — Ambulatory Visit (HOSPITAL_COMMUNITY)
Admission: RE | Admit: 2014-12-30 | Discharge: 2014-12-30 | Disposition: A | Discharge status: Home / Self Care | Payer: Commercial Managed Care - HMO | Source: Ambulatory Visit | Attending: Cardiovascular Disease | Admitting: Cardiovascular Disease

## 2014-12-30 ENCOUNTER — Ambulatory Visit (HOSPITAL_BASED_OUTPATIENT_CLINIC_OR_DEPARTMENT_OTHER)
Admission: RE | Admit: 2014-12-30 | Discharge: 2014-12-30 | Disposition: A | Discharge status: Home / Self Care | Payer: Commercial Managed Care - HMO | Source: Ambulatory Visit | Attending: Cardiovascular Disease | Admitting: Cardiovascular Disease

## 2014-12-30 DIAGNOSIS — I2510 Atherosclerotic heart disease of native coronary artery without angina pectoris: Secondary | ICD-10-CM | POA: Insufficient documentation

## 2014-12-30 DIAGNOSIS — I779 Disorder of arteries and arterioles, unspecified: Secondary | ICD-10-CM

## 2014-12-30 DIAGNOSIS — I6523 Occlusion and stenosis of bilateral carotid arteries: Secondary | ICD-10-CM | POA: Insufficient documentation

## 2014-12-30 DIAGNOSIS — Z954 Presence of other heart-valve replacement: Secondary | ICD-10-CM

## 2014-12-30 DIAGNOSIS — I10 Essential (primary) hypertension: Secondary | ICD-10-CM | POA: Insufficient documentation

## 2014-12-30 DIAGNOSIS — E785 Hyperlipidemia, unspecified: Secondary | ICD-10-CM | POA: Diagnosis not present

## 2014-12-30 DIAGNOSIS — Z87891 Personal history of nicotine dependence: Secondary | ICD-10-CM | POA: Insufficient documentation

## 2014-12-30 DIAGNOSIS — I2583 Coronary atherosclerosis due to lipid rich plaque: Principal | ICD-10-CM

## 2014-12-30 DIAGNOSIS — E119 Type 2 diabetes mellitus without complications: Secondary | ICD-10-CM | POA: Insufficient documentation

## 2014-12-30 DIAGNOSIS — Z952 Presence of prosthetic heart valve: Secondary | ICD-10-CM

## 2014-12-30 DIAGNOSIS — I739 Peripheral vascular disease, unspecified: Secondary | ICD-10-CM

## 2014-12-30 NOTE — Progress Notes (Signed)
2D Echocardiogram Complete.  12/30/2014   [name], RDCS

## 2014-12-30 NOTE — Progress Notes (Signed)
Carotid Duplex Completed. Janely Gullickson, BS, RDMS, RVT  

## 2015-01-01 ENCOUNTER — Encounter: Payer: Self-pay | Admitting: *Deleted

## 2015-01-04 ENCOUNTER — Encounter: Payer: Self-pay | Admitting: *Deleted

## 2015-01-10 ENCOUNTER — Other Ambulatory Visit: Payer: Self-pay | Admitting: Cardiovascular Disease

## 2015-01-11 NOTE — Telephone Encounter (Signed)
Rx has been sent to the pharmacy electronically. ° °

## 2015-01-18 ENCOUNTER — Ambulatory Visit (INDEPENDENT_AMBULATORY_CARE_PROVIDER_SITE_OTHER): Payer: Commercial Managed Care - HMO | Admitting: Pharmacist Clinician (PhC)/ Clinical Pharmacy Specialist

## 2015-01-18 DIAGNOSIS — Z7901 Long term (current) use of anticoagulants: Secondary | ICD-10-CM

## 2015-01-18 DIAGNOSIS — Z954 Presence of other heart-valve replacement: Secondary | ICD-10-CM | POA: Diagnosis not present

## 2015-01-18 DIAGNOSIS — Z952 Presence of prosthetic heart valve: Secondary | ICD-10-CM

## 2015-01-18 LAB — POCT INR: INR: 3.5

## 2015-02-02 ENCOUNTER — Other Ambulatory Visit: Payer: Self-pay | Admitting: Internal Medicine

## 2015-02-02 ENCOUNTER — Other Ambulatory Visit: Payer: Self-pay | Admitting: Pharmacist Clinician (PhC)/ Clinical Pharmacy Specialist

## 2015-02-02 NOTE — Telephone Encounter (Signed)
Rx refill sent to patient pharmacy   

## 2015-02-05 DIAGNOSIS — I10 Essential (primary) hypertension: Secondary | ICD-10-CM | POA: Diagnosis not present

## 2015-02-05 DIAGNOSIS — Z23 Encounter for immunization: Secondary | ICD-10-CM | POA: Diagnosis not present

## 2015-02-05 DIAGNOSIS — I6523 Occlusion and stenosis of bilateral carotid arteries: Secondary | ICD-10-CM | POA: Diagnosis not present

## 2015-02-05 DIAGNOSIS — M1990 Unspecified osteoarthritis, unspecified site: Secondary | ICD-10-CM | POA: Diagnosis not present

## 2015-02-05 DIAGNOSIS — E785 Hyperlipidemia, unspecified: Secondary | ICD-10-CM | POA: Diagnosis not present

## 2015-02-05 DIAGNOSIS — E119 Type 2 diabetes mellitus without complications: Secondary | ICD-10-CM | POA: Diagnosis not present

## 2015-03-04 ENCOUNTER — Ambulatory Visit (INDEPENDENT_AMBULATORY_CARE_PROVIDER_SITE_OTHER): Payer: Commercial Managed Care - HMO | Admitting: Pharmacist Clinician (PhC)/ Clinical Pharmacy Specialist

## 2015-03-04 DIAGNOSIS — Z7901 Long term (current) use of anticoagulants: Secondary | ICD-10-CM

## 2015-03-04 DIAGNOSIS — E119 Type 2 diabetes mellitus without complications: Secondary | ICD-10-CM | POA: Diagnosis not present

## 2015-03-04 DIAGNOSIS — R21 Rash and other nonspecific skin eruption: Secondary | ICD-10-CM | POA: Diagnosis not present

## 2015-03-04 DIAGNOSIS — Z954 Presence of other heart-valve replacement: Secondary | ICD-10-CM

## 2015-03-04 DIAGNOSIS — Z952 Presence of prosthetic heart valve: Secondary | ICD-10-CM

## 2015-03-04 DIAGNOSIS — B029 Zoster without complications: Secondary | ICD-10-CM | POA: Diagnosis not present

## 2015-03-04 LAB — POCT INR: INR: 3

## 2015-03-05 DIAGNOSIS — B029 Zoster without complications: Secondary | ICD-10-CM | POA: Diagnosis not present

## 2015-03-18 DIAGNOSIS — Z7901 Long term (current) use of anticoagulants: Secondary | ICD-10-CM | POA: Diagnosis not present

## 2015-03-18 DIAGNOSIS — M545 Low back pain: Secondary | ICD-10-CM | POA: Diagnosis not present

## 2015-03-18 DIAGNOSIS — N390 Urinary tract infection, site not specified: Secondary | ICD-10-CM | POA: Diagnosis not present

## 2015-03-18 DIAGNOSIS — M25552 Pain in left hip: Secondary | ICD-10-CM | POA: Diagnosis not present

## 2015-04-01 DIAGNOSIS — N390 Urinary tract infection, site not specified: Secondary | ICD-10-CM | POA: Diagnosis not present

## 2015-04-14 ENCOUNTER — Ambulatory Visit (INDEPENDENT_AMBULATORY_CARE_PROVIDER_SITE_OTHER): Payer: Commercial Managed Care - HMO | Admitting: Pharmacist Clinician (PhC)/ Clinical Pharmacy Specialist

## 2015-04-14 DIAGNOSIS — Z7901 Long term (current) use of anticoagulants: Secondary | ICD-10-CM

## 2015-04-14 DIAGNOSIS — Z954 Presence of other heart-valve replacement: Secondary | ICD-10-CM | POA: Diagnosis not present

## 2015-04-14 DIAGNOSIS — Z952 Presence of prosthetic heart valve: Secondary | ICD-10-CM

## 2015-04-14 LAB — POCT INR: INR: 3.2

## 2015-04-28 ENCOUNTER — Telehealth: Payer: Self-pay | Admitting: Pharmacist Clinician (PhC)/ Clinical Pharmacy Specialist

## 2015-04-28 NOTE — Telephone Encounter (Signed)
Closed encounter °

## 2015-05-26 ENCOUNTER — Ambulatory Visit: Payer: Commercial Managed Care - HMO | Admitting: Pharmacist Clinician (PhC)/ Clinical Pharmacy Specialist

## 2015-05-28 ENCOUNTER — Ambulatory Visit (INDEPENDENT_AMBULATORY_CARE_PROVIDER_SITE_OTHER): Payer: Commercial Managed Care - HMO | Admitting: Pharmacist Clinician (PhC)/ Clinical Pharmacy Specialist

## 2015-05-28 DIAGNOSIS — Z954 Presence of other heart-valve replacement: Secondary | ICD-10-CM | POA: Diagnosis not present

## 2015-05-28 DIAGNOSIS — Z7901 Long term (current) use of anticoagulants: Secondary | ICD-10-CM

## 2015-05-28 DIAGNOSIS — Z952 Presence of prosthetic heart valve: Secondary | ICD-10-CM

## 2015-05-28 LAB — POCT INR: INR: 2.8

## 2015-07-05 DIAGNOSIS — H2513 Age-related nuclear cataract, bilateral: Secondary | ICD-10-CM | POA: Diagnosis not present

## 2015-07-05 DIAGNOSIS — E119 Type 2 diabetes mellitus without complications: Secondary | ICD-10-CM | POA: Diagnosis not present

## 2015-07-05 DIAGNOSIS — H43822 Vitreomacular adhesion, left eye: Secondary | ICD-10-CM | POA: Diagnosis not present

## 2015-07-05 DIAGNOSIS — H25019 Cortical age-related cataract, unspecified eye: Secondary | ICD-10-CM | POA: Diagnosis not present

## 2015-07-09 ENCOUNTER — Ambulatory Visit (INDEPENDENT_AMBULATORY_CARE_PROVIDER_SITE_OTHER): Payer: Commercial Managed Care - HMO | Admitting: Pharmacist Clinician (PhC)/ Clinical Pharmacy Specialist

## 2015-07-09 DIAGNOSIS — Z952 Presence of prosthetic heart valve: Secondary | ICD-10-CM

## 2015-07-09 DIAGNOSIS — Z7901 Long term (current) use of anticoagulants: Secondary | ICD-10-CM | POA: Diagnosis not present

## 2015-07-09 DIAGNOSIS — Z954 Presence of other heart-valve replacement: Secondary | ICD-10-CM

## 2015-07-09 LAB — POCT INR: INR: 2.5

## 2015-08-06 DIAGNOSIS — Z79899 Other long term (current) drug therapy: Secondary | ICD-10-CM | POA: Diagnosis not present

## 2015-08-06 DIAGNOSIS — I10 Essential (primary) hypertension: Secondary | ICD-10-CM | POA: Diagnosis not present

## 2015-08-06 DIAGNOSIS — E1121 Type 2 diabetes mellitus with diabetic nephropathy: Secondary | ICD-10-CM | POA: Diagnosis not present

## 2015-08-06 DIAGNOSIS — Z7901 Long term (current) use of anticoagulants: Secondary | ICD-10-CM | POA: Diagnosis not present

## 2015-08-06 DIAGNOSIS — I6523 Occlusion and stenosis of bilateral carotid arteries: Secondary | ICD-10-CM | POA: Diagnosis not present

## 2015-08-06 DIAGNOSIS — N183 Chronic kidney disease, stage 3 (moderate): Secondary | ICD-10-CM | POA: Diagnosis not present

## 2015-08-06 DIAGNOSIS — E782 Mixed hyperlipidemia: Secondary | ICD-10-CM | POA: Diagnosis not present

## 2015-08-06 DIAGNOSIS — E559 Vitamin D deficiency, unspecified: Secondary | ICD-10-CM | POA: Diagnosis not present

## 2015-08-20 ENCOUNTER — Ambulatory Visit (INDEPENDENT_AMBULATORY_CARE_PROVIDER_SITE_OTHER): Payer: Commercial Managed Care - HMO | Admitting: Pharmacist Clinician (PhC)/ Clinical Pharmacy Specialist

## 2015-08-20 DIAGNOSIS — Z7901 Long term (current) use of anticoagulants: Secondary | ICD-10-CM | POA: Diagnosis not present

## 2015-08-20 DIAGNOSIS — Z952 Presence of prosthetic heart valve: Secondary | ICD-10-CM

## 2015-08-20 DIAGNOSIS — Z954 Presence of other heart-valve replacement: Secondary | ICD-10-CM | POA: Diagnosis not present

## 2015-08-20 LAB — POCT INR: INR: 3.2

## 2015-09-03 ENCOUNTER — Other Ambulatory Visit: Payer: Self-pay | Admitting: Cardiovascular Disease

## 2015-09-16 ENCOUNTER — Other Ambulatory Visit: Payer: Self-pay

## 2015-09-16 MED ORDER — IRBESARTAN 300 MG PO TABS: 300.0000 mg | ORAL_TABLET | Freq: Every day | ORAL | Status: DC

## 2015-09-16 MED ORDER — HYDRALAZINE HCL 50 MG PO TABS: 50.0000 mg | ORAL_TABLET | Freq: Three times a day (TID) | ORAL | Status: DC

## 2015-09-16 NOTE — Telephone Encounter (Signed)
Rx(s) sent to pharmacy electronically.  

## 2015-09-20 ENCOUNTER — Other Ambulatory Visit: Payer: Self-pay | Admitting: *Deleted

## 2015-09-20 MED ORDER — HYDRALAZINE HCL 50 MG PO TABS: 50.0000 mg | ORAL_TABLET | Freq: Three times a day (TID) | ORAL | Status: DC

## 2015-09-20 MED ORDER — IRBESARTAN 300 MG PO TABS: 300.0000 mg | ORAL_TABLET | Freq: Every day | ORAL | Status: DC

## 2015-09-23 ENCOUNTER — Telehealth: Payer: Self-pay | Admitting: Cardiovascular Disease

## 2015-09-23 NOTE — Telephone Encounter (Signed)
Incoming call from pharmacy. Needed OK for med refill - authorized refills that were sent to [facility] by mistake on 09/20/15 to be done w/ [facility] instead.

## 2015-09-27 ENCOUNTER — Ambulatory Visit (INDEPENDENT_AMBULATORY_CARE_PROVIDER_SITE_OTHER): Payer: Commercial Managed Care - HMO | Admitting: Pharmacist Clinician (PhC)/ Clinical Pharmacy Specialist

## 2015-09-27 DIAGNOSIS — Z7901 Long term (current) use of anticoagulants: Secondary | ICD-10-CM | POA: Diagnosis not present

## 2015-09-27 DIAGNOSIS — Z952 Presence of prosthetic heart valve: Secondary | ICD-10-CM

## 2015-09-27 DIAGNOSIS — Z954 Presence of other heart-valve replacement: Secondary | ICD-10-CM

## 2015-09-27 LAB — POCT INR: INR: 2.1

## 2015-10-18 ENCOUNTER — Encounter: Payer: Commercial Managed Care - HMO | Admitting: Pharmacist Clinician (PhC)/ Clinical Pharmacy Specialist

## 2015-10-26 ENCOUNTER — Ambulatory Visit (INDEPENDENT_AMBULATORY_CARE_PROVIDER_SITE_OTHER): Payer: Commercial Managed Care - HMO | Admitting: Pharmacist Clinician (PhC)/ Clinical Pharmacy Specialist

## 2015-10-26 ENCOUNTER — Encounter: Payer: Commercial Managed Care - HMO | Admitting: Pharmacist Clinician (PhC)/ Clinical Pharmacy Specialist

## 2015-10-26 DIAGNOSIS — Z954 Presence of other heart-valve replacement: Secondary | ICD-10-CM | POA: Diagnosis not present

## 2015-10-26 DIAGNOSIS — Z7901 Long term (current) use of anticoagulants: Secondary | ICD-10-CM | POA: Diagnosis not present

## 2015-10-26 DIAGNOSIS — Z952 Presence of prosthetic heart valve: Secondary | ICD-10-CM

## 2015-10-26 LAB — POCT INR: INR: 3.2

## 2015-11-11 DIAGNOSIS — L03011 Cellulitis of right finger: Secondary | ICD-10-CM | POA: Diagnosis not present

## 2015-11-23 ENCOUNTER — Encounter: Payer: Self-pay | Admitting: Podiatry

## 2015-11-23 ENCOUNTER — Ambulatory Visit (INDEPENDENT_AMBULATORY_CARE_PROVIDER_SITE_OTHER): Payer: Commercial Managed Care - HMO | Admitting: Podiatry

## 2015-11-23 VITALS — BP 164/68 | HR 54 | Resp 16

## 2015-11-23 DIAGNOSIS — L600 Ingrowing nail: Secondary | ICD-10-CM | POA: Diagnosis not present

## 2015-11-23 NOTE — Patient Instructions (Addendum)
Soak Instructions    THE DAY AFTER THE PROCEDURE  Place 1/4 cup of epsom salts in a quart of warm tap water.  Submerge your foot or feet with outer bandage intact for the initial soak; this will allow the bandage to become moist and wet for easy lift off.  Once you remove your bandage, continue to soak in the solution for 20 minutes.  This soak should be done twice a day.  Next, remove your foot or feet from solution, blot dry the affected area and cover.  You may use a band aid large enough to cover the area or use gauze and tape.  Apply other medications to the area as directed by the doctor such as polysporin neosporin.  IF YOUR SKIN BECOMES IRRITATED WHILE USING THESE INSTRUCTIONS, IT IS OKAY TO SWITCH TO  WHITE VINEGAR AND WATER. Or you may use antibacterial soap and water to keep the toe clean  Monitor for any signs/symptoms of infection. Call the office immediately if any occur or go directly to the emergency room. Call with any questions/concerns.   If the area is not healed within 2 weeks, please call the office or sooner if there are any issues. It was very nice meeting you today.

## 2015-11-24 NOTE — Progress Notes (Signed)
Patient ID: [name], female   DOB: 1940-12-20, 75 y.o.   MRN: [id number]  Subjective: 75 year old female presents the office with concerns of pain to the right big toenail lateral aspect which spent ongoing for last couple weeks. She states that she has been on antibiotics and she finishes them on February 8. She says that his previous infected however since the antibiotics the symptoms resolved significantly. She states that she still gets some tenderness of the tip of the toe but denies any drainage or pus or any drainage or swelling at this point. She has previously had a partial nail avulsion performed. Denies any systemic complaints such as fevers, chills, nausea, vomiting. No acute changes since last appointment, and no other complaints at this time.   She is on coumadin and INR 3.2  Objective: AAO x3, NAD DP/PT pulses palpable bilaterally, CRT less than 3 seconds Protective sensation intact with Simms Weinstein monofilament There is tenderness palpation along the lateral aspect right hallux toenail on the distal aspect. There is the proximal nail border. there is no edema, erythema, ascending cellulitis or any drainage or pus. The remaining nails appear to be without pathology. No areas of pinpoint bony tenderness or pain with vibratory sensation. MMT 5/5, ROM WNL. No edema, erythema, increase in warmth to bilateral lower extremities.  No open lesions or pre-ulcerative lesions.  No pain with calf compression, swelling, warmth, erythema  Assessment: Symptomatic ingrown toenail right lateral hallux   Plan: -All treatment options discussed with the patient including all alternatives, risks, complications.  -Discussed both partial nail avulsion as well as debridement. Given her history of peripheral arterial disease as well as being on Coumadin with an INR 3.2 there is no sign of infection and I recommended debridement of the toenail. The nails debrided to remove the symptomatic portion  of the ingrown toenail any complications. If symptoms continue or there is any reoccurrence of infection we'll likely have partial nail avulsion performed and she understands this. Epson salt soaks as well as Neosporin and a Band-Aid. There is no bleeding incurred during the debridement today.  -Follow-up of symptoms resolved within the next 2 weeks or sooner if any issues are to arise. She has been getting pedicures have recommended against this. -Patient encouraged to call the office with any questions, concerns, change in symptoms.   Celesta Gentile, DPM

## 2015-12-06 ENCOUNTER — Ambulatory Visit (INDEPENDENT_AMBULATORY_CARE_PROVIDER_SITE_OTHER): Payer: Commercial Managed Care - HMO | Admitting: Pharmacist Clinician (PhC)/ Clinical Pharmacy Specialist

## 2015-12-06 DIAGNOSIS — Z7901 Long term (current) use of anticoagulants: Secondary | ICD-10-CM | POA: Diagnosis not present

## 2015-12-06 DIAGNOSIS — Z954 Presence of other heart-valve replacement: Secondary | ICD-10-CM | POA: Diagnosis not present

## 2015-12-06 DIAGNOSIS — Z952 Presence of prosthetic heart valve: Secondary | ICD-10-CM

## 2015-12-06 LAB — POCT INR: INR: 3.8

## 2016-01-03 ENCOUNTER — Ambulatory Visit (INDEPENDENT_AMBULATORY_CARE_PROVIDER_SITE_OTHER): Payer: Commercial Managed Care - HMO | Admitting: Pharmacist Clinician (PhC)/ Clinical Pharmacy Specialist

## 2016-01-03 DIAGNOSIS — Z7901 Long term (current) use of anticoagulants: Secondary | ICD-10-CM | POA: Diagnosis not present

## 2016-01-03 DIAGNOSIS — Z954 Presence of other heart-valve replacement: Secondary | ICD-10-CM | POA: Diagnosis not present

## 2016-01-03 DIAGNOSIS — Z952 Presence of prosthetic heart valve: Secondary | ICD-10-CM

## 2016-01-03 LAB — POCT INR: INR: 3.5

## 2016-01-12 ENCOUNTER — Encounter: Payer: Self-pay | Admitting: Cardiovascular Disease

## 2016-01-12 ENCOUNTER — Other Ambulatory Visit: Payer: Self-pay | Admitting: Cardiovascular Disease

## 2016-01-12 ENCOUNTER — Ambulatory Visit (INDEPENDENT_AMBULATORY_CARE_PROVIDER_SITE_OTHER): Payer: Commercial Managed Care - HMO | Admitting: Cardiovascular Disease

## 2016-01-12 VITALS — BP 156/64 | HR 65 | Ht 61.0 in | Wt 149.0 lb

## 2016-01-12 DIAGNOSIS — I4891 Unspecified atrial fibrillation: Secondary | ICD-10-CM

## 2016-01-12 DIAGNOSIS — I158 Other secondary hypertension: Secondary | ICD-10-CM

## 2016-01-12 DIAGNOSIS — I2510 Atherosclerotic heart disease of native coronary artery without angina pectoris: Secondary | ICD-10-CM | POA: Diagnosis not present

## 2016-01-12 DIAGNOSIS — I779 Disorder of arteries and arterioles, unspecified: Secondary | ICD-10-CM

## 2016-01-12 DIAGNOSIS — I739 Peripheral vascular disease, unspecified: Secondary | ICD-10-CM

## 2016-01-12 DIAGNOSIS — I2583 Coronary atherosclerosis due to lipid rich plaque: Principal | ICD-10-CM

## 2016-01-12 DIAGNOSIS — I482 Chronic atrial fibrillation, unspecified: Secondary | ICD-10-CM

## 2016-01-12 NOTE — Assessment & Plan Note (Signed)
This pain is in A. Fib today with a ventricular response of 65. This is a new finding for her. She is rate controlled and is already on Coumadin anticoagulation for her mechanical aortic valve replacement.

## 2016-01-12 NOTE — Patient Instructions (Addendum)
Medication Instructions:  Your physician recommends that you continue on your current medications as directed. Please refer to the Current Medication list given to you today.   Labwork: none  Testing/Procedures: Your physician has requested that you have an echocardiogram. Echocardiography is a painless test that uses sound waves to create images of your heart. It provides your doctor with information about the size and shape of your heart and how well your heart's chambers and valves are working. This procedure takes approximately one hour. There are no restrictions for this procedure.  Your physician has requested that you have a carotid duplex. This test is an ultrasound of the carotid arteries in your neck. It looks at blood flow through these arteries that supply the brain with blood. Allow one hour for this exam. There are no restrictions or special instructions.  Your physician has requested that you have a lower extremity arterial doppler- During this test, ultrasound is used to evaluate arterial blood flow in the legs. Allow approximately one hour for this exam.    Follow-Up: Your physician wants you to follow-up in: 12 months with Dr [name]. You will receive a reminder letter in the mail two months in advance. If you don't receive a letter, please call our office to schedule the follow-up appointment.   Any Other Special Instructions Will Be Listed Below (If Applicable).     If you need a refill on your cardiac medications before your next appointment, please call your pharmacy.

## 2016-01-12 NOTE — Progress Notes (Signed)
01/12/2016 [name]   15-Sep-1941  [id number]  Primary Physician [name] Blackbird, MD Primary Cardiologist: [name] Harp MD [name]   HPI:  The patient is a 75 year old, mildly overweight, widowed Caucasian female mother of 52, grandmother to 53 grandchildren whose daughter unfortunately recently died of ALS after a long protracted course. I last saw her in the office 12/11/14. .The patient has a history of CAD and valvular heart disease as well as PVOD. She is status post coronary artery bypass grafting x2 with a St. Jude AVR July 13, 2004. Her other problems include hypertension, hyperlipidemia, diabetes, and discontinued tobacco abuse. I dilated her left common iliac and redilated her for in-stent restenosis in 2006. We have been following her lower extremity and carotid Dopplers since that time. She does have mild to moderate right and mild left ICA stenosis which has remained stable. She is neurologically asymptomatic. She denies chest pain, shortness of breath or claudication. [name] follows her lipid profile which was done most recently in March revealing a total cholesterol of 106, LDL of 53 and HDL of 29. Her most recent carotid Doppler in June did show moderate right and moderately severe left ICA stenosis which had not changed, and her lower extremity Dopplers revealed ABIs of 1 bilaterally with bilateral iliac disease. Since I saw her last a year ago she's remained stable and is asymptomatic otherwise. She does give right total hip replacement which is scheduled to be performed in [name] next week. I'm going to get a 2-D echocardiogram to assess her aortic prosthesis as well as a pharmacologic Myoview stress test to stratify her symptoms been over a year since her last functional study and she is a diabetic of 10 years post bypass grafting.she did have a high-risk Myoview back in 2014 that she had a false positive Myoview back in 2006 after her  bypass surgery as well. She denies chest pain or shortness of breath. She ultimately never had her hip replacement. Since I saw her a year ago she started well clinically. She denies chest pain. She occasionally gets dyspnea on exertion. Her major complaint is right hip discomfort and fatigue in her right leg when she walks.   Current Outpatient Prescriptions  Medication Sig Dispense Refill  . ALPRAZolam (XANAX) 0.5 MG tablet Take 0.25-0.5 mg by mouth 2 (two) times daily as needed for anxiety. For anxiety    . aspirin EC 81 MG tablet Take 81 mg by mouth daily. Patient only takes on Mon Wed and Friday    . atorvastatin (LIPITOR) 80 MG tablet Take 80 mg by mouth daily.     . Cholecalciferol (VITAMIN D3) 1000 UNITS CAPS Take 1 capsule by mouth daily.    . furosemide (LASIX) 40 MG tablet Take 60 mg by mouth daily. Takes 1 and 1/2 tablets of the 40mg  daily    . hydrALAZINE (APRESOLINE) 50 MG tablet Take 1 tablet (50 mg total) by mouth 3 (three) times daily. 270 tablet 1  . irbesartan (AVAPRO) 300 MG tablet Take 1 tablet (300 mg total) by mouth daily. 90 tablet 1  . metFORMIN (GLUCOPHAGE) 500 MG tablet Take 500 mg by mouth 2 (two) times daily with a meal.     . metoprolol (LOPRESSOR) 50 MG tablet Take 25 mg by mouth 2 (two) times daily.     . pantoprazole (PROTONIX) 40 MG tablet     . traMADol (ULTRAM) 50 MG tablet Take 50 mg by mouth every  6 (six) hours as needed for moderate pain or severe pain.     . vitamin B-12 (CYANOCOBALAMIN) 100 MCG tablet Take 100 mcg by mouth daily.     [name] warfarin (COUMADIN) 5 MG tablet TAKE 1 TO 1.5 (ONE TO ONE & ONE-HALF) TABLETS BY MOUTH DAILY AS DIRECTED BY COUMADIN CLINIC 120 tablet 1   No current facility-administered medications for this visit.    Allergies  Allergen Reactions  . Losartan Swelling  . Verapamil Hives  . Zetia [Ezetimibe] Other (See Comments)    Reaction=unknown  . Zocor [Simvastatin - High Dose]     Social History   Social History  .  Marital Status: Widowed    Spouse Name: N/A  . Number of Children: N/A  . Years of Education: N/A   Occupational History  . Not on file.   Social History Main Topics  . Smoking status: Former Smoker -- 1.00 packs/day for 30 years    Types: Cigarettes  . Smokeless tobacco: Not on file  . Alcohol Use: No  . Drug Use: No  . Sexual Activity: Not on file   Other Topics Concern  . Not on file   Social History Narrative     Review of Systems: General: negative for chills, fever, night sweats or weight changes.  Cardiovascular: negative for chest pain, dyspnea on exertion, edema, orthopnea, palpitations, paroxysmal nocturnal dyspnea or shortness of breath Dermatological: negative for rash Respiratory: negative for cough or wheezing Urologic: negative for hematuria Abdominal: negative for nausea, vomiting, diarrhea, bright red blood per rectum, melena, or hematemesis Neurologic: negative for visual changes, syncope, or dizziness All other systems reviewed and are otherwise negative except as noted above.    Blood pressure 156/64, pulse 65, height 5\' 1"  (1.549 m), weight 149 lb (67.586 kg).  General appearance: alert and no distress Neck: no adenopathy, no carotid bruit, no JVD, supple, symmetrical, trachea midline and thyroid not enlarged, symmetric, no tenderness/mass/nodules Lungs: clear to auscultation bilaterally Heart: soft outflow tract murmur, crisp prosthetic valve sounds Extremities: extremities normal, atraumatic, no cyanosis or edema  EKG atrial fibrillation with a ventricular response of 65. This is a new finding for her. Appears to review this EKG.  ASSESSMENT AND PLAN:   Hypertension History of hypertension with blood pressure measured at 156/64. She is on hydralazine, Avapro  and metoprolol. Continue current meds at current dosing  Hypercholesteremia History of hyperlipidemia on statin therapy followed by her PCP  Mechanical heart valve present History of St.  Jude aVR 07/13/2004. She is on Coumadin anticoagulation. We followed this by annual 2-D echocardiograms.  Coronary artery disease History of coronary disease status post bypass grafting X 2 07/13/2004 at the time of aortic valve replacement.  Carotid artery disease History of moderate bilateral internal carotid artery stenosis by duplex ultrasound 12/30/14. We will recheck a carotid Doppler study. She is neurologically asymptomatic  Peripheral arterial disease History of peripheral arterial disease with Dopplers performed 12/25/14 revealing essentially normal ABIs bilaterally with a moderately hyperglycemic signal in the origin of the right common iliac artery. She does complain of right hip discomfort and tightness in her right leg when she ambulates although she is told that she has right hip arthritis as well. We'll recheck lower extremity arterial Doppler studies.      [name] Harp MD FACP,FACC,FAHA, [facility] 01/12/2016 11:50 AM

## 2016-01-12 NOTE — Assessment & Plan Note (Signed)
History of coronary disease status post bypass grafting X 2 07/13/2004 at the time of aortic valve replacement.

## 2016-01-12 NOTE — Assessment & Plan Note (Signed)
History of hypertension with blood pressure measured at 156/64. She is on hydralazine, Avapro  and metoprolol. Continue current meds at current dosing

## 2016-01-12 NOTE — Assessment & Plan Note (Signed)
History of St. Jude aVR 07/13/2004. She is on Coumadin anticoagulation. We followed this by annual 2-D echocardiograms.

## 2016-01-12 NOTE — Assessment & Plan Note (Signed)
History of hyperlipidemia on statin therapy followed by her PCP. 

## 2016-01-12 NOTE — Assessment & Plan Note (Signed)
History of moderate bilateral internal carotid artery stenosis by duplex ultrasound 12/30/14. We will recheck a carotid Doppler study. She is neurologically asymptomatic

## 2016-01-12 NOTE — Assessment & Plan Note (Signed)
History of peripheral arterial disease with Dopplers performed 12/25/14 revealing essentially normal ABIs bilaterally with a moderately hyperglycemic signal in the origin of the right common iliac artery. She does complain of right hip discomfort and tightness in her right leg when she ambulates although she is told that she has right hip arthritis as well. We'll recheck lower extremity arterial Doppler studies.

## 2016-01-27 ENCOUNTER — Ambulatory Visit (HOSPITAL_COMMUNITY)
Admission: RE | Admit: 2016-01-27 | Discharge: 2016-01-27 | Disposition: A | Discharge status: Home / Self Care | Payer: Commercial Managed Care - HMO | Source: Ambulatory Visit | Attending: Cardiovascular Disease | Admitting: Cardiovascular Disease

## 2016-01-27 ENCOUNTER — Ambulatory Visit (HOSPITAL_COMMUNITY)
Admission: RE | Admit: 2016-01-27 | Discharge: 2016-01-27 | Disposition: A | Discharge status: Home / Self Care | Payer: Commercial Managed Care - HMO | Source: Ambulatory Visit | Attending: Cardiology | Admitting: Cardiology

## 2016-01-27 DIAGNOSIS — I6523 Occlusion and stenosis of bilateral carotid arteries: Secondary | ICD-10-CM | POA: Diagnosis not present

## 2016-01-27 DIAGNOSIS — I2510 Atherosclerotic heart disease of native coronary artery without angina pectoris: Secondary | ICD-10-CM | POA: Diagnosis not present

## 2016-01-27 DIAGNOSIS — I4891 Unspecified atrial fibrillation: Secondary | ICD-10-CM

## 2016-01-27 DIAGNOSIS — I700 Atherosclerosis of aorta: Secondary | ICD-10-CM | POA: Diagnosis not present

## 2016-01-27 DIAGNOSIS — I739 Peripheral vascular disease, unspecified: Secondary | ICD-10-CM | POA: Insufficient documentation

## 2016-01-27 DIAGNOSIS — I10 Essential (primary) hypertension: Secondary | ICD-10-CM | POA: Insufficient documentation

## 2016-01-27 DIAGNOSIS — I708 Atherosclerosis of other arteries: Secondary | ICD-10-CM | POA: Diagnosis not present

## 2016-01-27 DIAGNOSIS — E119 Type 2 diabetes mellitus without complications: Secondary | ICD-10-CM | POA: Insufficient documentation

## 2016-01-27 DIAGNOSIS — E7800 Pure hypercholesterolemia, unspecified: Secondary | ICD-10-CM | POA: Diagnosis not present

## 2016-01-27 DIAGNOSIS — I158 Other secondary hypertension: Secondary | ICD-10-CM

## 2016-02-01 ENCOUNTER — Ambulatory Visit (HOSPITAL_COMMUNITY): Payer: Commercial Managed Care - HMO

## 2016-02-04 ENCOUNTER — Telehealth: Payer: Self-pay

## 2016-02-04 ENCOUNTER — Telehealth: Payer: Self-pay | Admitting: *Deleted

## 2016-02-04 ENCOUNTER — Other Ambulatory Visit: Payer: Self-pay | Admitting: Family Medicine

## 2016-02-04 DIAGNOSIS — I739 Peripheral vascular disease, unspecified: Secondary | ICD-10-CM

## 2016-02-04 DIAGNOSIS — N183 Chronic kidney disease, stage 3 (moderate): Secondary | ICD-10-CM | POA: Diagnosis not present

## 2016-02-04 DIAGNOSIS — Z1231 Encounter for screening mammogram for malignant neoplasm of breast: Secondary | ICD-10-CM

## 2016-02-04 DIAGNOSIS — I10 Essential (primary) hypertension: Secondary | ICD-10-CM | POA: Diagnosis not present

## 2016-02-04 DIAGNOSIS — F411 Generalized anxiety disorder: Secondary | ICD-10-CM | POA: Diagnosis not present

## 2016-02-04 DIAGNOSIS — I6523 Occlusion and stenosis of bilateral carotid arteries: Secondary | ICD-10-CM | POA: Diagnosis not present

## 2016-02-04 DIAGNOSIS — I779 Disorder of arteries and arterioles, unspecified: Secondary | ICD-10-CM

## 2016-02-04 DIAGNOSIS — E782 Mixed hyperlipidemia: Secondary | ICD-10-CM | POA: Diagnosis not present

## 2016-02-04 DIAGNOSIS — E2839 Other primary ovarian failure: Secondary | ICD-10-CM

## 2016-02-04 DIAGNOSIS — Z1239 Encounter for other screening for malignant neoplasm of breast: Secondary | ICD-10-CM | POA: Diagnosis not present

## 2016-02-04 DIAGNOSIS — E1122 Type 2 diabetes mellitus with diabetic chronic kidney disease: Secondary | ICD-10-CM | POA: Diagnosis not present

## 2016-02-04 DIAGNOSIS — M8580 Other specified disorders of bone density and structure, unspecified site: Secondary | ICD-10-CM | POA: Diagnosis not present

## 2016-02-04 NOTE — Telephone Encounter (Signed)
-----   Message from Lorretta Harp, MD sent at 01/30/2016  3:12 PM EDT ----- No change from prior study. Repeat in 12 months.

## 2016-02-04 NOTE — Telephone Encounter (Signed)
-----   Message from Lorretta Harp, MD sent at 01/30/2016  3:11 PM EDT ----- No change from prior study. Repeat in 6 months

## 2016-02-05 ENCOUNTER — Other Ambulatory Visit: Payer: Self-pay | Admitting: Cardiovascular Disease

## 2016-02-07 ENCOUNTER — Encounter: Payer: Self-pay | Admitting: Podiatry

## 2016-02-07 ENCOUNTER — Ambulatory Visit (INDEPENDENT_AMBULATORY_CARE_PROVIDER_SITE_OTHER): Payer: Commercial Managed Care - HMO | Admitting: Podiatry

## 2016-02-07 DIAGNOSIS — M79676 Pain in unspecified toe(s): Secondary | ICD-10-CM

## 2016-02-07 DIAGNOSIS — B351 Tinea unguium: Secondary | ICD-10-CM | POA: Diagnosis not present

## 2016-02-07 NOTE — Progress Notes (Signed)
Patient ID: [name], female   DOB: 1941-01-12, 75 y.o.   MRN: [id number]  Subjective: 75 y.o. returns the office today for painful, elongated, thickened toenails which she cannot trim herself. Denies any redness or drainage around the nails. Denies any acute changes since last appointment and no new complaints today. Denies any systemic complaints such as fevers, chills, nausea, vomiting.   Objective: AAO 3, NAD DP/PT pulses palpable, CRT less than 3 seconds Nails hypertrophic, dystrophic, elongated, brittle, discolored 10. There is tenderness overlying the nails 1-5 bilaterally. There is no surrounding erythema or drainage along the nail sites. No open lesions or pre-ulcerative lesions are identified. No other areas of tenderness bilateral lower extremities. No overlying edema, erythema, increased warmth. No pain with calf compression, swelling, warmth, erythema.  Assessment: Patient presents with symptomatic onychomycosis  Plan: -Treatment options including alternatives, risks, complications were discussed -Nails sharply debrided 10 without complication/bleeding. -Discussed daily foot inspection. If there are any changes, to call the office immediately.  -Follow-up in 3 months or sooner if any problems are to arise. In the meantime, encouraged to call the office with any questions, concerns, changes symptoms.  Celesta Gentile, DPM

## 2016-02-08 ENCOUNTER — Other Ambulatory Visit: Payer: Self-pay | Admitting: Cardiovascular Disease

## 2016-02-08 ENCOUNTER — Ambulatory Visit: Payer: Commercial Managed Care - HMO | Admitting: Podiatry

## 2016-02-08 DIAGNOSIS — I10 Essential (primary) hypertension: Secondary | ICD-10-CM | POA: Diagnosis not present

## 2016-02-08 DIAGNOSIS — Z7984 Long term (current) use of oral hypoglycemic drugs: Secondary | ICD-10-CM | POA: Diagnosis not present

## 2016-02-08 DIAGNOSIS — I739 Peripheral vascular disease, unspecified: Secondary | ICD-10-CM | POA: Diagnosis not present

## 2016-02-08 DIAGNOSIS — N183 Chronic kidney disease, stage 3 (moderate): Secondary | ICD-10-CM | POA: Diagnosis not present

## 2016-02-08 DIAGNOSIS — R8290 Unspecified abnormal findings in urine: Secondary | ICD-10-CM | POA: Diagnosis not present

## 2016-02-08 DIAGNOSIS — I6523 Occlusion and stenosis of bilateral carotid arteries: Secondary | ICD-10-CM | POA: Diagnosis not present

## 2016-02-08 DIAGNOSIS — E782 Mixed hyperlipidemia: Secondary | ICD-10-CM | POA: Diagnosis not present

## 2016-02-08 DIAGNOSIS — E1122 Type 2 diabetes mellitus with diabetic chronic kidney disease: Secondary | ICD-10-CM | POA: Diagnosis not present

## 2016-02-14 ENCOUNTER — Ambulatory Visit (INDEPENDENT_AMBULATORY_CARE_PROVIDER_SITE_OTHER): Payer: Commercial Managed Care - HMO | Admitting: Pharmacist Clinician (PhC)/ Clinical Pharmacy Specialist

## 2016-02-14 ENCOUNTER — Other Ambulatory Visit (HOSPITAL_COMMUNITY): Payer: Commercial Managed Care - HMO

## 2016-02-14 DIAGNOSIS — Z954 Presence of other heart-valve replacement: Secondary | ICD-10-CM

## 2016-02-14 DIAGNOSIS — Z7901 Long term (current) use of anticoagulants: Secondary | ICD-10-CM | POA: Diagnosis not present

## 2016-02-14 DIAGNOSIS — Z952 Presence of prosthetic heart valve: Secondary | ICD-10-CM

## 2016-02-14 LAB — POCT INR: INR: 3.1

## 2016-02-22 DIAGNOSIS — N390 Urinary tract infection, site not specified: Secondary | ICD-10-CM | POA: Diagnosis not present

## 2016-02-22 DIAGNOSIS — R8290 Unspecified abnormal findings in urine: Secondary | ICD-10-CM | POA: Diagnosis not present

## 2016-02-24 ENCOUNTER — Other Ambulatory Visit: Payer: Self-pay

## 2016-02-24 ENCOUNTER — Ambulatory Visit (HOSPITAL_COMMUNITY): Payer: Commercial Managed Care - HMO | Attending: Cardiology

## 2016-02-24 DIAGNOSIS — I2510 Atherosclerotic heart disease of native coronary artery without angina pectoris: Secondary | ICD-10-CM | POA: Insufficient documentation

## 2016-02-24 DIAGNOSIS — Z952 Presence of prosthetic heart valve: Secondary | ICD-10-CM | POA: Diagnosis not present

## 2016-02-24 DIAGNOSIS — E785 Hyperlipidemia, unspecified: Secondary | ICD-10-CM | POA: Diagnosis not present

## 2016-02-24 DIAGNOSIS — I158 Other secondary hypertension: Secondary | ICD-10-CM

## 2016-02-24 DIAGNOSIS — I340 Nonrheumatic mitral (valve) insufficiency: Secondary | ICD-10-CM | POA: Insufficient documentation

## 2016-02-24 DIAGNOSIS — I739 Peripheral vascular disease, unspecified: Secondary | ICD-10-CM | POA: Diagnosis not present

## 2016-02-24 DIAGNOSIS — I4891 Unspecified atrial fibrillation: Secondary | ICD-10-CM | POA: Insufficient documentation

## 2016-02-28 ENCOUNTER — Ambulatory Visit
Admission: RE | Admit: 2016-02-28 | Discharge: 2016-02-28 | Disposition: A | Discharge status: Home / Self Care | Payer: Commercial Managed Care - HMO | Source: Ambulatory Visit | Attending: Family Medicine | Admitting: Family Medicine

## 2016-02-28 DIAGNOSIS — M8589 Other specified disorders of bone density and structure, multiple sites: Secondary | ICD-10-CM | POA: Diagnosis not present

## 2016-02-28 DIAGNOSIS — Z780 Asymptomatic menopausal state: Secondary | ICD-10-CM | POA: Diagnosis not present

## 2016-02-28 DIAGNOSIS — E2839 Other primary ovarian failure: Secondary | ICD-10-CM

## 2016-02-28 DIAGNOSIS — Z1231 Encounter for screening mammogram for malignant neoplasm of breast: Secondary | ICD-10-CM

## 2016-02-29 ENCOUNTER — Other Ambulatory Visit: Payer: Self-pay | Admitting: *Deleted

## 2016-02-29 DIAGNOSIS — Z952 Presence of prosthetic heart valve: Secondary | ICD-10-CM

## 2016-03-27 ENCOUNTER — Ambulatory Visit (INDEPENDENT_AMBULATORY_CARE_PROVIDER_SITE_OTHER): Payer: Commercial Managed Care - HMO | Admitting: Pharmacist

## 2016-03-27 DIAGNOSIS — Z952 Presence of prosthetic heart valve: Secondary | ICD-10-CM

## 2016-03-27 DIAGNOSIS — Z7901 Long term (current) use of anticoagulants: Secondary | ICD-10-CM

## 2016-03-27 DIAGNOSIS — Z954 Presence of other heart-valve replacement: Secondary | ICD-10-CM

## 2016-03-27 LAB — POCT INR: INR: 3.3

## 2016-05-08 ENCOUNTER — Ambulatory Visit: Payer: Commercial Managed Care - HMO | Admitting: Podiatry

## 2016-05-08 ENCOUNTER — Ambulatory Visit (INDEPENDENT_AMBULATORY_CARE_PROVIDER_SITE_OTHER): Payer: Commercial Managed Care - HMO | Admitting: Pharmacist Clinician (PhC)/ Clinical Pharmacy Specialist

## 2016-05-08 DIAGNOSIS — Z7901 Long term (current) use of anticoagulants: Secondary | ICD-10-CM | POA: Diagnosis not present

## 2016-05-08 DIAGNOSIS — Z954 Presence of other heart-valve replacement: Secondary | ICD-10-CM

## 2016-05-08 DIAGNOSIS — Z952 Presence of prosthetic heart valve: Secondary | ICD-10-CM

## 2016-05-08 LAB — POCT INR: INR: 3.9

## 2016-05-31 ENCOUNTER — Other Ambulatory Visit: Payer: Self-pay | Admitting: Cardiovascular Disease

## 2016-05-31 NOTE — Telephone Encounter (Signed)
Rx(s) sent to pharmacy electronically.  

## 2016-06-05 ENCOUNTER — Ambulatory Visit (INDEPENDENT_AMBULATORY_CARE_PROVIDER_SITE_OTHER): Payer: Commercial Managed Care - HMO | Admitting: Pharmacist Clinician (PhC)/ Clinical Pharmacy Specialist

## 2016-06-05 ENCOUNTER — Other Ambulatory Visit: Payer: Self-pay | Admitting: Cardiovascular Disease

## 2016-06-05 DIAGNOSIS — Z954 Presence of other heart-valve replacement: Secondary | ICD-10-CM | POA: Diagnosis not present

## 2016-06-05 DIAGNOSIS — Z7901 Long term (current) use of anticoagulants: Secondary | ICD-10-CM

## 2016-06-05 DIAGNOSIS — Z952 Presence of prosthetic heart valve: Secondary | ICD-10-CM

## 2016-06-05 LAB — POCT INR: INR: 4.1

## 2016-06-22 ENCOUNTER — Ambulatory Visit (INDEPENDENT_AMBULATORY_CARE_PROVIDER_SITE_OTHER): Payer: Commercial Managed Care - HMO | Admitting: Pharmacist

## 2016-06-22 DIAGNOSIS — Z952 Presence of prosthetic heart valve: Secondary | ICD-10-CM

## 2016-06-22 DIAGNOSIS — Z954 Presence of other heart-valve replacement: Secondary | ICD-10-CM

## 2016-06-22 DIAGNOSIS — Z7901 Long term (current) use of anticoagulants: Secondary | ICD-10-CM | POA: Diagnosis not present

## 2016-06-22 LAB — POCT INR: INR: 3.2

## 2016-07-13 ENCOUNTER — Ambulatory Visit (INDEPENDENT_AMBULATORY_CARE_PROVIDER_SITE_OTHER): Payer: Commercial Managed Care - HMO | Admitting: Pharmacist

## 2016-07-13 DIAGNOSIS — Z7901 Long term (current) use of anticoagulants: Secondary | ICD-10-CM | POA: Diagnosis not present

## 2016-07-13 DIAGNOSIS — Z952 Presence of prosthetic heart valve: Secondary | ICD-10-CM | POA: Diagnosis not present

## 2016-07-13 LAB — POCT INR: INR: 3

## 2016-08-01 DIAGNOSIS — H2513 Age-related nuclear cataract, bilateral: Secondary | ICD-10-CM | POA: Diagnosis not present

## 2016-08-01 DIAGNOSIS — E113291 Type 2 diabetes mellitus with mild nonproliferative diabetic retinopathy without macular edema, right eye: Secondary | ICD-10-CM | POA: Diagnosis not present

## 2016-08-01 DIAGNOSIS — H43822 Vitreomacular adhesion, left eye: Secondary | ICD-10-CM | POA: Diagnosis not present

## 2016-08-08 DIAGNOSIS — N183 Chronic kidney disease, stage 3 (moderate): Secondary | ICD-10-CM | POA: Diagnosis not present

## 2016-08-08 DIAGNOSIS — E119 Type 2 diabetes mellitus without complications: Secondary | ICD-10-CM | POA: Diagnosis not present

## 2016-08-08 DIAGNOSIS — Z7901 Long term (current) use of anticoagulants: Secondary | ICD-10-CM | POA: Diagnosis not present

## 2016-08-08 DIAGNOSIS — I6523 Occlusion and stenosis of bilateral carotid arteries: Secondary | ICD-10-CM | POA: Diagnosis not present

## 2016-08-08 DIAGNOSIS — D649 Anemia, unspecified: Secondary | ICD-10-CM | POA: Diagnosis not present

## 2016-08-08 DIAGNOSIS — I10 Essential (primary) hypertension: Secondary | ICD-10-CM | POA: Diagnosis not present

## 2016-08-08 DIAGNOSIS — I739 Peripheral vascular disease, unspecified: Secondary | ICD-10-CM | POA: Diagnosis not present

## 2016-08-08 DIAGNOSIS — Z79899 Other long term (current) drug therapy: Secondary | ICD-10-CM | POA: Diagnosis not present

## 2016-08-14 ENCOUNTER — Ambulatory Visit (INDEPENDENT_AMBULATORY_CARE_PROVIDER_SITE_OTHER): Payer: Commercial Managed Care - HMO | Admitting: Pharmacist Clinician (PhC)/ Clinical Pharmacy Specialist

## 2016-08-14 DIAGNOSIS — Z952 Presence of prosthetic heart valve: Secondary | ICD-10-CM | POA: Diagnosis not present

## 2016-08-14 DIAGNOSIS — Z7901 Long term (current) use of anticoagulants: Secondary | ICD-10-CM | POA: Diagnosis not present

## 2016-08-14 LAB — POCT INR: INR: 3.2

## 2016-08-18 ENCOUNTER — Other Ambulatory Visit: Payer: Self-pay | Admitting: Cardiovascular Disease

## 2016-09-06 DIAGNOSIS — E871 Hypo-osmolality and hyponatremia: Secondary | ICD-10-CM | POA: Diagnosis not present

## 2016-09-06 DIAGNOSIS — Z79899 Other long term (current) drug therapy: Secondary | ICD-10-CM | POA: Diagnosis not present

## 2016-09-06 DIAGNOSIS — R739 Hyperglycemia, unspecified: Secondary | ICD-10-CM | POA: Diagnosis not present

## 2016-09-25 ENCOUNTER — Ambulatory Visit (INDEPENDENT_AMBULATORY_CARE_PROVIDER_SITE_OTHER): Payer: Commercial Managed Care - HMO | Admitting: Pharmacist Clinician (PhC)/ Clinical Pharmacy Specialist

## 2016-09-25 DIAGNOSIS — Z7901 Long term (current) use of anticoagulants: Secondary | ICD-10-CM

## 2016-09-25 DIAGNOSIS — Z952 Presence of prosthetic heart valve: Secondary | ICD-10-CM | POA: Diagnosis not present

## 2016-09-25 LAB — POCT INR: INR: 3.3

## 2016-11-03 DIAGNOSIS — E113291 Type 2 diabetes mellitus with mild nonproliferative diabetic retinopathy without macular edema, right eye: Secondary | ICD-10-CM | POA: Diagnosis not present

## 2016-11-06 ENCOUNTER — Ambulatory Visit (INDEPENDENT_AMBULATORY_CARE_PROVIDER_SITE_OTHER): Payer: Medicare HMO | Admitting: Pharmacist Clinician (PhC)/ Clinical Pharmacy Specialist

## 2016-11-06 DIAGNOSIS — Z7901 Long term (current) use of anticoagulants: Secondary | ICD-10-CM

## 2016-11-06 DIAGNOSIS — Z952 Presence of prosthetic heart valve: Secondary | ICD-10-CM

## 2016-11-06 LAB — POCT INR: INR: 2.6

## 2016-11-10 DIAGNOSIS — N3001 Acute cystitis with hematuria: Secondary | ICD-10-CM | POA: Diagnosis not present

## 2016-11-10 DIAGNOSIS — J029 Acute pharyngitis, unspecified: Secondary | ICD-10-CM | POA: Diagnosis not present

## 2016-11-10 DIAGNOSIS — N939 Abnormal uterine and vaginal bleeding, unspecified: Secondary | ICD-10-CM | POA: Diagnosis not present

## 2016-12-20 ENCOUNTER — Ambulatory Visit (INDEPENDENT_AMBULATORY_CARE_PROVIDER_SITE_OTHER): Payer: Medicare HMO | Admitting: Pharmacist Clinician (PhC)/ Clinical Pharmacy Specialist

## 2016-12-20 DIAGNOSIS — Z7901 Long term (current) use of anticoagulants: Secondary | ICD-10-CM | POA: Diagnosis not present

## 2016-12-20 DIAGNOSIS — Z952 Presence of prosthetic heart valve: Secondary | ICD-10-CM

## 2016-12-20 LAB — POCT INR: INR: 2.9

## 2017-01-01 ENCOUNTER — Other Ambulatory Visit: Payer: Self-pay | Admitting: Cardiovascular Disease

## 2017-01-08 DIAGNOSIS — J209 Acute bronchitis, unspecified: Secondary | ICD-10-CM | POA: Diagnosis not present

## 2017-01-08 DIAGNOSIS — R05 Cough: Secondary | ICD-10-CM | POA: Diagnosis not present

## 2017-01-30 DIAGNOSIS — H43822 Vitreomacular adhesion, left eye: Secondary | ICD-10-CM | POA: Diagnosis not present

## 2017-01-31 ENCOUNTER — Ambulatory Visit (INDEPENDENT_AMBULATORY_CARE_PROVIDER_SITE_OTHER): Payer: Medicare HMO | Admitting: Pharmacist

## 2017-01-31 DIAGNOSIS — Z7901 Long term (current) use of anticoagulants: Secondary | ICD-10-CM | POA: Diagnosis not present

## 2017-01-31 DIAGNOSIS — Z952 Presence of prosthetic heart valve: Secondary | ICD-10-CM | POA: Diagnosis not present

## 2017-01-31 LAB — POCT INR: INR: 3.1

## 2017-02-06 DIAGNOSIS — D649 Anemia, unspecified: Secondary | ICD-10-CM | POA: Diagnosis not present

## 2017-02-06 DIAGNOSIS — E785 Hyperlipidemia, unspecified: Secondary | ICD-10-CM | POA: Diagnosis not present

## 2017-02-06 DIAGNOSIS — M810 Age-related osteoporosis without current pathological fracture: Secondary | ICD-10-CM | POA: Diagnosis not present

## 2017-02-06 DIAGNOSIS — E119 Type 2 diabetes mellitus without complications: Secondary | ICD-10-CM | POA: Diagnosis not present

## 2017-02-06 DIAGNOSIS — I10 Essential (primary) hypertension: Secondary | ICD-10-CM | POA: Diagnosis not present

## 2017-02-12 ENCOUNTER — Other Ambulatory Visit (HOSPITAL_COMMUNITY): Payer: Medicare HMO

## 2017-02-13 ENCOUNTER — Other Ambulatory Visit: Payer: Self-pay | Admitting: Cardiovascular Disease

## 2017-02-13 DIAGNOSIS — E785 Hyperlipidemia, unspecified: Secondary | ICD-10-CM | POA: Diagnosis not present

## 2017-02-13 DIAGNOSIS — R8290 Unspecified abnormal findings in urine: Secondary | ICD-10-CM | POA: Diagnosis not present

## 2017-02-13 DIAGNOSIS — I10 Essential (primary) hypertension: Secondary | ICD-10-CM | POA: Diagnosis not present

## 2017-02-13 DIAGNOSIS — R946 Abnormal results of thyroid function studies: Secondary | ICD-10-CM | POA: Diagnosis not present

## 2017-02-13 DIAGNOSIS — D649 Anemia, unspecified: Secondary | ICD-10-CM | POA: Diagnosis not present

## 2017-02-13 DIAGNOSIS — E119 Type 2 diabetes mellitus without complications: Secondary | ICD-10-CM | POA: Diagnosis not present

## 2017-02-13 DIAGNOSIS — M810 Age-related osteoporosis without current pathological fracture: Secondary | ICD-10-CM | POA: Diagnosis not present

## 2017-02-13 NOTE — Telephone Encounter (Signed)
Rx(s) sent to pharmacy electronically.  

## 2017-02-15 DIAGNOSIS — R946 Abnormal results of thyroid function studies: Secondary | ICD-10-CM | POA: Diagnosis not present

## 2017-02-23 ENCOUNTER — Other Ambulatory Visit: Payer: Self-pay | Admitting: Family Medicine

## 2017-02-23 ENCOUNTER — Ambulatory Visit
Admission: RE | Admit: 2017-02-23 | Discharge: 2017-02-23 | Disposition: A | Discharge status: Home / Self Care | Payer: Medicare HMO | Source: Ambulatory Visit | Attending: Family Medicine | Admitting: Family Medicine

## 2017-02-23 DIAGNOSIS — J209 Acute bronchitis, unspecified: Secondary | ICD-10-CM | POA: Diagnosis not present

## 2017-02-23 DIAGNOSIS — J0100 Acute maxillary sinusitis, unspecified: Secondary | ICD-10-CM | POA: Diagnosis not present

## 2017-02-23 DIAGNOSIS — R059 Cough, unspecified: Secondary | ICD-10-CM

## 2017-02-23 DIAGNOSIS — R05 Cough: Secondary | ICD-10-CM

## 2017-02-23 DIAGNOSIS — R0981 Nasal congestion: Secondary | ICD-10-CM

## 2017-02-26 ENCOUNTER — Other Ambulatory Visit (HOSPITAL_COMMUNITY): Payer: Medicare HMO

## 2017-03-08 ENCOUNTER — Ambulatory Visit (HOSPITAL_COMMUNITY): Payer: Medicare HMO | Attending: Internal Medicine

## 2017-03-08 ENCOUNTER — Other Ambulatory Visit: Payer: Self-pay

## 2017-03-08 DIAGNOSIS — I2510 Atherosclerotic heart disease of native coronary artery without angina pectoris: Secondary | ICD-10-CM | POA: Insufficient documentation

## 2017-03-08 DIAGNOSIS — I4891 Unspecified atrial fibrillation: Secondary | ICD-10-CM | POA: Insufficient documentation

## 2017-03-08 DIAGNOSIS — Z952 Presence of prosthetic heart valve: Secondary | ICD-10-CM | POA: Diagnosis not present

## 2017-03-08 DIAGNOSIS — I081 Rheumatic disorders of both mitral and tricuspid valves: Secondary | ICD-10-CM | POA: Diagnosis not present

## 2017-03-12 ENCOUNTER — Other Ambulatory Visit: Payer: Self-pay | Admitting: Cardiovascular Disease

## 2017-03-12 DIAGNOSIS — I482 Chronic atrial fibrillation, unspecified: Secondary | ICD-10-CM

## 2017-03-16 ENCOUNTER — Ambulatory Visit (INDEPENDENT_AMBULATORY_CARE_PROVIDER_SITE_OTHER): Payer: Medicare HMO | Admitting: Pharmacist

## 2017-03-16 DIAGNOSIS — Z7901 Long term (current) use of anticoagulants: Secondary | ICD-10-CM | POA: Diagnosis not present

## 2017-03-16 DIAGNOSIS — Z952 Presence of prosthetic heart valve: Secondary | ICD-10-CM

## 2017-03-16 LAB — POCT INR: INR: 2.6

## 2017-03-19 ENCOUNTER — Other Ambulatory Visit: Payer: Self-pay | Admitting: Cardiovascular Disease

## 2017-03-19 DIAGNOSIS — I779 Disorder of arteries and arterioles, unspecified: Secondary | ICD-10-CM

## 2017-03-19 DIAGNOSIS — I739 Peripheral vascular disease, unspecified: Secondary | ICD-10-CM

## 2017-03-22 ENCOUNTER — Ambulatory Visit
Admission: RE | Admit: 2017-03-22 | Discharge: 2017-03-22 | Disposition: A | Discharge status: Home / Self Care | Payer: Medicare HMO | Source: Ambulatory Visit | Attending: Family Medicine | Admitting: Family Medicine

## 2017-03-22 ENCOUNTER — Other Ambulatory Visit: Payer: Self-pay | Admitting: Family Medicine

## 2017-03-22 DIAGNOSIS — J329 Chronic sinusitis, unspecified: Secondary | ICD-10-CM | POA: Diagnosis not present

## 2017-04-19 ENCOUNTER — Other Ambulatory Visit: Payer: Self-pay | Admitting: Cardiovascular Disease

## 2017-04-19 NOTE — Telephone Encounter (Signed)
Rx request sent to pharmacy.  

## 2017-04-20 ENCOUNTER — Ambulatory Visit (HOSPITAL_COMMUNITY)
Admission: RE | Admit: 2017-04-20 | Discharge: 2017-04-20 | Disposition: A | Discharge status: Home / Self Care | Payer: Medicare HMO | Source: Ambulatory Visit | Attending: Cardiovascular Disease | Admitting: Cardiovascular Disease

## 2017-04-20 ENCOUNTER — Ambulatory Visit (HOSPITAL_COMMUNITY)
Admission: RE | Admit: 2017-04-20 | Discharge: 2017-04-20 | Disposition: A | Discharge status: Home / Self Care | Payer: Medicare HMO | Source: Ambulatory Visit | Attending: Cardiology | Admitting: Cardiology

## 2017-04-20 DIAGNOSIS — I739 Peripheral vascular disease, unspecified: Secondary | ICD-10-CM

## 2017-04-20 DIAGNOSIS — I700 Atherosclerosis of aorta: Secondary | ICD-10-CM | POA: Diagnosis not present

## 2017-04-20 DIAGNOSIS — Z87891 Personal history of nicotine dependence: Secondary | ICD-10-CM | POA: Diagnosis not present

## 2017-04-20 DIAGNOSIS — I779 Disorder of arteries and arterioles, unspecified: Secondary | ICD-10-CM | POA: Diagnosis present

## 2017-04-20 DIAGNOSIS — I2510 Atherosclerotic heart disease of native coronary artery without angina pectoris: Secondary | ICD-10-CM | POA: Insufficient documentation

## 2017-04-20 DIAGNOSIS — I6523 Occlusion and stenosis of bilateral carotid arteries: Secondary | ICD-10-CM | POA: Diagnosis not present

## 2017-04-20 DIAGNOSIS — I708 Atherosclerosis of other arteries: Secondary | ICD-10-CM | POA: Diagnosis not present

## 2017-04-20 DIAGNOSIS — E1151 Type 2 diabetes mellitus with diabetic peripheral angiopathy without gangrene: Secondary | ICD-10-CM | POA: Insufficient documentation

## 2017-04-20 DIAGNOSIS — E785 Hyperlipidemia, unspecified: Secondary | ICD-10-CM | POA: Insufficient documentation

## 2017-04-20 DIAGNOSIS — I10 Essential (primary) hypertension: Secondary | ICD-10-CM | POA: Diagnosis not present

## 2017-04-27 ENCOUNTER — Ambulatory Visit (INDEPENDENT_AMBULATORY_CARE_PROVIDER_SITE_OTHER): Payer: Medicare HMO | Admitting: Pharmacist

## 2017-04-27 DIAGNOSIS — Z7901 Long term (current) use of anticoagulants: Secondary | ICD-10-CM | POA: Diagnosis not present

## 2017-04-27 DIAGNOSIS — Z952 Presence of prosthetic heart valve: Secondary | ICD-10-CM

## 2017-04-27 LAB — POCT INR: INR: 2.8

## 2017-05-02 ENCOUNTER — Other Ambulatory Visit: Payer: Self-pay | Admitting: Cardiovascular Disease

## 2017-05-02 DIAGNOSIS — I739 Peripheral vascular disease, unspecified: Principal | ICD-10-CM

## 2017-05-02 DIAGNOSIS — I779 Disorder of arteries and arterioles, unspecified: Secondary | ICD-10-CM

## 2017-05-03 ENCOUNTER — Other Ambulatory Visit: Payer: Self-pay | Admitting: Cardiovascular Disease

## 2017-05-03 DIAGNOSIS — I739 Peripheral vascular disease, unspecified: Secondary | ICD-10-CM

## 2017-05-05 ENCOUNTER — Other Ambulatory Visit: Payer: Self-pay | Admitting: Cardiovascular Disease

## 2017-05-08 DIAGNOSIS — R600 Localized edema: Secondary | ICD-10-CM | POA: Diagnosis not present

## 2017-05-08 DIAGNOSIS — H6123 Impacted cerumen, bilateral: Secondary | ICD-10-CM | POA: Diagnosis not present

## 2017-05-11 DIAGNOSIS — H9193 Unspecified hearing loss, bilateral: Secondary | ICD-10-CM | POA: Diagnosis not present

## 2017-05-24 ENCOUNTER — Other Ambulatory Visit: Payer: Self-pay | Admitting: Internal Medicine

## 2017-05-24 DIAGNOSIS — Z1231 Encounter for screening mammogram for malignant neoplasm of breast: Secondary | ICD-10-CM

## 2017-05-24 DIAGNOSIS — E7800 Pure hypercholesterolemia, unspecified: Secondary | ICD-10-CM | POA: Diagnosis not present

## 2017-05-24 DIAGNOSIS — M810 Age-related osteoporosis without current pathological fracture: Secondary | ICD-10-CM | POA: Diagnosis not present

## 2017-05-24 DIAGNOSIS — Z952 Presence of prosthetic heart valve: Secondary | ICD-10-CM | POA: Diagnosis not present

## 2017-05-24 DIAGNOSIS — F411 Generalized anxiety disorder: Secondary | ICD-10-CM | POA: Insufficient documentation

## 2017-05-24 DIAGNOSIS — I6529 Occlusion and stenosis of unspecified carotid artery: Secondary | ICD-10-CM | POA: Insufficient documentation

## 2017-05-24 DIAGNOSIS — I480 Paroxysmal atrial fibrillation: Secondary | ICD-10-CM | POA: Diagnosis not present

## 2017-05-24 DIAGNOSIS — I10 Essential (primary) hypertension: Secondary | ICD-10-CM | POA: Diagnosis not present

## 2017-05-24 DIAGNOSIS — R600 Localized edema: Secondary | ICD-10-CM | POA: Diagnosis not present

## 2017-05-24 DIAGNOSIS — I7389 Other specified peripheral vascular diseases: Secondary | ICD-10-CM | POA: Diagnosis not present

## 2017-05-24 DIAGNOSIS — E1151 Type 2 diabetes mellitus with diabetic peripheral angiopathy without gangrene: Secondary | ICD-10-CM | POA: Diagnosis not present

## 2017-05-24 DIAGNOSIS — H9190 Unspecified hearing loss, unspecified ear: Secondary | ICD-10-CM | POA: Insufficient documentation

## 2017-06-04 ENCOUNTER — Ambulatory Visit: Payer: Medicare HMO

## 2017-06-08 ENCOUNTER — Ambulatory Visit (INDEPENDENT_AMBULATORY_CARE_PROVIDER_SITE_OTHER): Payer: Medicare HMO | Admitting: Pharmacist

## 2017-06-08 DIAGNOSIS — Z7901 Long term (current) use of anticoagulants: Secondary | ICD-10-CM | POA: Diagnosis not present

## 2017-06-08 DIAGNOSIS — Z952 Presence of prosthetic heart valve: Secondary | ICD-10-CM

## 2017-06-08 LAB — POCT INR: INR: 2.4

## 2017-06-13 ENCOUNTER — Ambulatory Visit
Admission: RE | Admit: 2017-06-13 | Discharge: 2017-06-13 | Disposition: A | Discharge status: Home / Self Care | Payer: Medicare HMO | Source: Ambulatory Visit | Attending: Internal Medicine | Admitting: Internal Medicine

## 2017-06-13 DIAGNOSIS — Z1231 Encounter for screening mammogram for malignant neoplasm of breast: Secondary | ICD-10-CM

## 2017-06-20 ENCOUNTER — Ambulatory Visit (INDEPENDENT_AMBULATORY_CARE_PROVIDER_SITE_OTHER): Payer: Medicare HMO | Admitting: Cardiovascular Disease

## 2017-06-20 ENCOUNTER — Encounter: Payer: Self-pay | Admitting: Cardiovascular Disease

## 2017-06-20 VITALS — BP 168/66 | HR 60 | Ht 61.0 in | Wt 150.0 lb

## 2017-06-20 DIAGNOSIS — I2583 Coronary atherosclerosis due to lipid rich plaque: Secondary | ICD-10-CM

## 2017-06-20 DIAGNOSIS — Z952 Presence of prosthetic heart valve: Secondary | ICD-10-CM | POA: Diagnosis not present

## 2017-06-20 DIAGNOSIS — I10 Essential (primary) hypertension: Secondary | ICD-10-CM

## 2017-06-20 DIAGNOSIS — E7800 Pure hypercholesterolemia, unspecified: Secondary | ICD-10-CM

## 2017-06-20 DIAGNOSIS — I779 Disorder of arteries and arterioles, unspecified: Secondary | ICD-10-CM | POA: Diagnosis not present

## 2017-06-20 DIAGNOSIS — I482 Chronic atrial fibrillation, unspecified: Secondary | ICD-10-CM

## 2017-06-20 DIAGNOSIS — I2510 Atherosclerotic heart disease of native coronary artery without angina pectoris: Secondary | ICD-10-CM | POA: Diagnosis not present

## 2017-06-20 DIAGNOSIS — I739 Peripheral vascular disease, unspecified: Secondary | ICD-10-CM

## 2017-06-20 NOTE — Assessment & Plan Note (Signed)
History of CAD status post bypass grafting 210/5/05 at the time of aortic valve replacement.

## 2017-06-20 NOTE — Assessment & Plan Note (Signed)
Chronic atrial fibrillation rate controlled on Coumadin anticoagulation.

## 2017-06-20 NOTE — Assessment & Plan Note (Signed)
History of hyperlipidemia on statin therapy followed by her PCP. 

## 2017-06-20 NOTE — Assessment & Plan Note (Signed)
History of St. Jude AVR 07/13/04 at the time of bypass grafting. She is on Coumadin anticoagulation. Her most recent 2-D echo performed 03/08/17 revealed normal LV systolic function with a peak gradient of 48, mean of 24 and a PA pressure of 43. We'll continue to follow this by 2-D echo on annual basis.

## 2017-06-20 NOTE — Assessment & Plan Note (Signed)
History of carotid artery disease with mild progression of her right and left internal carotid arteries bilaterally by duplex ultrasound 04/20/17. We'll we will continue to follow this on an annual basis. She is neurologically symptomatic.

## 2017-06-20 NOTE — Assessment & Plan Note (Signed)
History of essential hypertension blood pressure measured 168/66. She is on Avapro and metoprolol as well as hydralazine. Current meds at current dosing

## 2017-06-20 NOTE — Assessment & Plan Note (Signed)
History of peripheral arterial disease with Doppler performed 04/20/17 revealed normal ABIs bilaterally with moderate bilateral common iliac artery stenoses. She really denies lifestyle limiting claudication at this time.

## 2017-06-20 NOTE — Patient Instructions (Signed)
Medication Instructions: Your physician recommends that you continue on your current medications as directed. Please refer to the Current Medication list given to you today.   Testing/Procedures: Your physician has requested that you have an echocardiogram in June 2019. Echocardiography is a painless test that uses sound waves to create images of your heart. It provides your doctor with information about the size and shape of your heart and how well your heart's chambers and valves are working. This procedure takes approximately one hour. There are no restrictions for this procedure.  Your physician has requested that you have a carotid duplex July 2019. This test is an ultrasound of the carotid arteries in your neck. It looks at blood flow through these arteries that supply the brain with blood. Allow one hour for this exam. There are no restrictions or special instructions.  Follow-Up: Your physician wants you to follow-up in: 1 year with Dr. [name]. You will receive a reminder letter in the mail two months in advance. If you don't receive a letter, please call our office to schedule the follow-up appointment.  If you need a refill on your cardiac medications before your next appointment, please call your pharmacy.

## 2017-06-20 NOTE — Progress Notes (Signed)
06/20/2017 [name]   [birth year]  [id number]  Primary Physician [name], [name], MD Primary Cardiologist: [name] Harp MD [name], [name]  HPI:  [name] is a 76 y.o. female , mildly overweight, widowed Caucasian female mother of 72, grandmother to 67 grandchildren whose daughter unfortunately recently died of ALS after a long protracted course. I last saw her in the office 01/12/16. .The patient has a history of CAD and valvular heart disease as well as PVOD. She is status post coronary artery bypass grafting x2 with a St. Jude AVR July 13, 2004. Her other problems include hypertension, hyperlipidemia, diabetes, and discontinued tobacco abuse. I dilated her left common iliac and redilated her for in-stent restenosis in 2006. We have been following her lower extremity and carotid Dopplers since that time. She does have mild to moderate right and mild left ICA stenosis which has remained stable. She is neurologically asymptomatic. She denies chest pain, shortness of breath or claudication. [name] follows her lipid profile which was done most recently in March revealing a total cholesterol of 106, LDL of 53 and HDL of 29. Her most recent carotid Doppler in June did show moderate right and moderately severe left ICA stenosis which had not changed, and her lower extremity Dopplers revealed ABIs of 1 bilaterally with bilateral iliac disease. Since I saw her last a year ago she's remained stable and is asymptomatic otherwise. She does give right total hip replacement which is scheduled to be performed in Dr. Jean [name] next [name]. I'm going to get a 2-D echocardiogram to assess her aortic prosthesis as well as a pharmacologic Myoview stress test to stratify her symptoms been over a year since her last functional study and she is a diabetic of 10 years post bypass grafting.she did have a high-risk Myoview back in 2014 that she had a false positive Myoview back in 2006  after her bypass surgery as well. She denies chest pain or shortness of breath. She ultimately never had her hip replacement. Since I saw her a year ago she started well clinically. She denies chest pain or shortness of breath. She had a 2-D echocardiogram performed 03/08/17 revealing normal LV size and function with a well functioning aortic mechanical prosthesis and a peak gradient of 40 mmHg. Carotid Dopplers performed 04/20/17 revealed mild progression of disease bilaterally with moderate right and moderately severe left ICA stenosis.   Current Meds  Medication Sig  . ALPRAZolam (XANAX) 0.5 MG tablet Take 0.25-0.5 mg by mouth 2 (two) times daily as needed for anxiety. For anxiety  . aspirin EC 81 MG tablet Take 81 mg by mouth daily. Patient only takes on Mon Wed and Friday  . atorvastatin (LIPITOR) 80 MG tablet Take 80 mg by mouth daily.   . Cholecalciferol (VITAMIN D3) 1000 UNITS CAPS Take 1 capsule by mouth daily.  . furosemide (LASIX) 40 MG tablet Take 60 mg by mouth daily. Takes 1 and 1/2 tablets of the 40mg  daily  . glipiZIDE (GLUCOTROL XL) 2.5 MG 24 hr tablet Take 1 tablet by mouth daily.  . hydrALAZINE (APRESOLINE) 50 MG tablet Take 50 mg by mouth 2 (two) times daily.  . irbesartan (AVAPRO) 300 MG tablet TAKE 1 TABLET (300 MG TOTAL) DAILY (PLEASE MAKE APPOINTMENT FOR REFILLS)  . metFORMIN (GLUCOPHAGE) 500 MG tablet Take 500 mg by mouth 2 (two) times daily with a meal.   . metoprolol (LOPRESSOR) 50 MG tablet Take 25 mg by mouth 2 (  two) times daily.   . pantoprazole (PROTONIX) 40 MG tablet Take 40 mg by mouth daily.   . traMADol (ULTRAM) 50 MG tablet Take 50 mg by mouth every 6 (six) hours as needed for moderate pain or severe pain.   . vitamin B-12 (CYANOCOBALAMIN) 100 MCG tablet Take 100 mcg by mouth daily.   [name] warfarin (COUMADIN) 5 MG tablet TAKE 1 TO 1.5 (ONE TO ONE & ONE-HALF) TABLETS BY MOUTH DAILY AS DIRECTED BY COUMADIN CLINIC     Allergies  Allergen Reactions  . Losartan  Swelling  . Verapamil Hives  . Zetia [Ezetimibe] Other (See Comments)    Reaction=unknown  . Zocor [Simvastatin - High Dose]     Social History   Social History  . Marital status: Widowed    Spouse name: N/A  . Number of children: N/A  . Years of education: N/A   Occupational History  . Not on file.   Social History Main Topics  . Smoking status: Former Smoker    Packs/day: 1.00    Years: 30.00    Types: Cigarettes  . Smokeless tobacco: Never Used  . Alcohol use No  . Drug use: No  . Sexual activity: Not on file   Other Topics Concern  . Not on file   Social History Narrative  . No narrative on file     Review of Systems: General: negative for chills, fever, night sweats or weight changes.  Cardiovascular: negative for chest pain, dyspnea on exertion, edema, orthopnea, palpitations, paroxysmal nocturnal dyspnea or shortness of breath Dermatological: negative for rash Respiratory: negative for cough or wheezing Urologic: negative for hematuria Abdominal: negative for nausea, vomiting, diarrhea, bright red blood per rectum, melena, or hematemesis Neurologic: negative for visual changes, syncope, or dizziness All other systems reviewed and are otherwise negative except as noted above.    Blood pressure (!) 168/66, pulse 60, height 5\' 1"  (1.549 m), weight 150 lb (68 kg).  General appearance: alert and no distress Neck: no adenopathy, no JVD, supple, symmetrical, trachea midline, thyroid not enlarged, symmetric, no tenderness/mass/nodules and Bilateral carotid bruits Lungs: clear to auscultation bilaterally Heart: Irregularly irregular rate with crisp aortic mechanical valve sounds and a soft outflow tract murmur Extremities: 1+ pitting edema bilaterally  EKG atrial fibrillation with a ventricular response of 60 and rightward axis. I personally reviewed this EKG.  ASSESSMENT AND PLAN:   Hypertension History of essential hypertension blood pressure measured  168/66. She is on Avapro and metoprolol as well as hydralazine. Current meds at current dosing  Hypercholesteremia History of hyperlipidemia on statin therapy followed by her PCP  Mechanical heart valve present History of St. Jude AVR 07/13/04 at the time of bypass grafting. She is on Coumadin anticoagulation. Her most recent 2-D echo performed 03/08/17 revealed normal LV systolic function with a peak gradient of 48, mean of 24 and a PA pressure of 43. We'll continue to follow this by 2-D echo on annual basis.  Coronary artery disease History of CAD status post bypass grafting 210/5/05 at the time of aortic valve replacement.  Carotid artery disease History of carotid artery disease with mild progression of her right and left internal carotid arteries bilaterally by duplex ultrasound 04/20/17. We'll we will continue to follow this on an annual basis. She is neurologically symptomatic.  Peripheral arterial disease History of peripheral arterial disease with Doppler performed 04/20/17 revealed normal ABIs bilaterally with moderate bilateral common iliac artery stenoses. She really denies lifestyle limiting claudication at this time.  Atrial fibrillation (HCC) Chronic atrial fibrillation rate controlled on Coumadin anticoagulation.      [name] Harp MD FACP,FACC,FAHA, [facility] 06/20/2017 10:57 AM

## 2017-07-20 ENCOUNTER — Ambulatory Visit (INDEPENDENT_AMBULATORY_CARE_PROVIDER_SITE_OTHER): Payer: Medicare HMO | Admitting: Pharmacist Clinician (PhC)/ Clinical Pharmacy Specialist

## 2017-07-20 DIAGNOSIS — Z7901 Long term (current) use of anticoagulants: Secondary | ICD-10-CM

## 2017-07-20 DIAGNOSIS — I482 Chronic atrial fibrillation, unspecified: Secondary | ICD-10-CM

## 2017-07-20 DIAGNOSIS — Z952 Presence of prosthetic heart valve: Secondary | ICD-10-CM | POA: Diagnosis not present

## 2017-07-20 LAB — POCT INR: INR: 2.5

## 2017-07-25 ENCOUNTER — Other Ambulatory Visit: Payer: Self-pay | Admitting: Cardiovascular Disease

## 2017-07-25 NOTE — Telephone Encounter (Signed)
REFILL 

## 2017-08-06 DIAGNOSIS — H35371 Puckering of macula, right eye: Secondary | ICD-10-CM | POA: Diagnosis not present

## 2017-08-06 DIAGNOSIS — H43821 Vitreomacular adhesion, right eye: Secondary | ICD-10-CM | POA: Diagnosis not present

## 2017-08-06 DIAGNOSIS — E119 Type 2 diabetes mellitus without complications: Secondary | ICD-10-CM | POA: Diagnosis not present

## 2017-08-06 DIAGNOSIS — H43822 Vitreomacular adhesion, left eye: Secondary | ICD-10-CM | POA: Diagnosis not present

## 2017-08-21 DIAGNOSIS — Z6827 Body mass index (BMI) 27.0-27.9, adult: Secondary | ICD-10-CM | POA: Diagnosis not present

## 2017-08-21 DIAGNOSIS — J069 Acute upper respiratory infection, unspecified: Secondary | ICD-10-CM | POA: Diagnosis not present

## 2017-08-21 DIAGNOSIS — R05 Cough: Secondary | ICD-10-CM | POA: Diagnosis not present

## 2017-08-21 DIAGNOSIS — E1151 Type 2 diabetes mellitus with diabetic peripheral angiopathy without gangrene: Secondary | ICD-10-CM | POA: Diagnosis not present

## 2017-08-21 DIAGNOSIS — I480 Paroxysmal atrial fibrillation: Secondary | ICD-10-CM | POA: Diagnosis not present

## 2017-08-21 DIAGNOSIS — I10 Essential (primary) hypertension: Secondary | ICD-10-CM | POA: Diagnosis not present

## 2017-08-27 ENCOUNTER — Telehealth: Payer: Self-pay | Admitting: Pharmacist Clinician (PhC)/ Clinical Pharmacy Specialist

## 2017-08-27 ENCOUNTER — Ambulatory Visit (INDEPENDENT_AMBULATORY_CARE_PROVIDER_SITE_OTHER): Payer: Medicare HMO | Admitting: Pharmacist Clinician (PhC)/ Clinical Pharmacy Specialist

## 2017-08-27 DIAGNOSIS — Z952 Presence of prosthetic heart valve: Secondary | ICD-10-CM | POA: Diagnosis not present

## 2017-08-27 DIAGNOSIS — Z7901 Long term (current) use of anticoagulants: Secondary | ICD-10-CM | POA: Diagnosis not present

## 2017-08-27 DIAGNOSIS — I482 Chronic atrial fibrillation, unspecified: Secondary | ICD-10-CM

## 2017-08-27 LAB — POCT INR: INR: 1.8

## 2017-08-27 MED ORDER — HYDRALAZINE HCL 50 MG PO TABS: 75.0000 mg | ORAL_TABLET | Freq: Three times a day (TID) | ORAL | 3 refills | Status: DC

## 2017-08-27 NOTE — Telephone Encounter (Signed)
Patient in office for INR check.  Reports home BP readings consistently run [id number] systolic.  Patient currently on irbesartan 300 mg qd, metoprolol 25 mg bid and hydralazine 50 mg tid.   Unable to take amlodipine due to swelling.    Brought her home cuff today, it read 158/64, office reading was 160/60.     Will have her increase hydralazine to 75 mg tid.  Patient voiced understanding

## 2017-09-09 ENCOUNTER — Other Ambulatory Visit: Payer: Self-pay | Admitting: Cardiovascular Disease

## 2017-09-21 DIAGNOSIS — S9032XA Contusion of left foot, initial encounter: Secondary | ICD-10-CM | POA: Diagnosis not present

## 2017-09-22 ENCOUNTER — Other Ambulatory Visit: Payer: Self-pay | Admitting: Cardiovascular Disease

## 2017-09-25 ENCOUNTER — Ambulatory Visit (INDEPENDENT_AMBULATORY_CARE_PROVIDER_SITE_OTHER): Payer: Medicare HMO | Admitting: Pharmacist

## 2017-09-25 ENCOUNTER — Telehealth: Payer: Self-pay | Admitting: Cardiovascular Disease

## 2017-09-25 DIAGNOSIS — Z7901 Long term (current) use of anticoagulants: Secondary | ICD-10-CM | POA: Diagnosis not present

## 2017-09-25 DIAGNOSIS — Z952 Presence of prosthetic heart valve: Secondary | ICD-10-CM

## 2017-09-25 LAB — POCT INR: INR: 2.1

## 2017-09-25 MED ORDER — CARVEDILOL 25 MG PO TABS: 25.0000 mg | ORAL_TABLET | Freq: Two times a day (BID) | ORAL | 1 refills | Status: DC

## 2017-09-25 MED ORDER — IRBESARTAN 300 MG PO TABS: 300.0000 mg | ORAL_TABLET | Freq: Every day | ORAL | 0 refills | Status: DC

## 2017-09-25 MED ORDER — ATORVASTATIN CALCIUM 80 MG PO TABS: 80.0000 mg | ORAL_TABLET | Freq: Every day | ORAL | 0 refills | Status: DC

## 2017-09-25 NOTE — Progress Notes (Signed)
Increase dose to 1 tablet daily except 1.5 tablets each Monday. Wednesday and Friday.  Repeat INR in 2 weeks. Blood pressure 168/54; STOP taking metoprolol , START taking carvedilol 25mg  twice daily #[phone] (Ceara Wrightson/Kristin)

## 2017-09-25 NOTE — Telephone Encounter (Signed)
Rx has been sent to the pharmacy electronically. ° °

## 2017-09-25 NOTE — Telephone Encounter (Signed)
New Message   *STAT* If patient is at the pharmacy, call can be transferred to refill team.   1. Which medications need to be refilled? (please list name of each medication and dose if known) Atorvastatin 80mg   2. Which pharmacy/location (including street and city if local pharmacy) is medication to be sent to? [place] they need a 30 day or 90 day supply? Kennyth Lose states pt would just need about 2 weeks. Enough time for the medication to be received through mail.

## 2017-09-25 NOTE — Patient Instructions (Addendum)
Increase dose to 1 tablet daily except 1.5 tablets each Monday. Wednesday and Friday.  Repeat INR in 2 weeks. Blood pressure 168/54; STOP taking metoprolol , START taking carvedilol 25mg  twice daily [phone] - [phone] (Shakura Cowing/Kristin)

## 2017-10-10 ENCOUNTER — Ambulatory Visit (INDEPENDENT_AMBULATORY_CARE_PROVIDER_SITE_OTHER): Payer: Medicare HMO | Admitting: Pharmacist Clinician (PhC)/ Clinical Pharmacy Specialist

## 2017-10-10 DIAGNOSIS — Z952 Presence of prosthetic heart valve: Secondary | ICD-10-CM

## 2017-10-10 DIAGNOSIS — Z7901 Long term (current) use of anticoagulants: Secondary | ICD-10-CM

## 2017-10-10 LAB — POCT INR: INR: 2.7

## 2017-10-10 MED ORDER — CHLORTHALIDONE 25 MG PO TABS: 25.0000 mg | ORAL_TABLET | Freq: Every day | ORAL | 3 refills | Status: DC

## 2017-10-10 NOTE — Patient Instructions (Signed)
Description   Continue with 1 tablet daily except 1.5 tablets each Monday. Wednesday and Friday.  Repeat INR in 4 weeks.   176/64 in office today    Stop carvedilol and switch back to metoprolol.  Take 50 mg (2 of the 25 mg tablets) twice daily.  Start chlorthalidone 25 mg once daily.  Go to the lab in 2 weeks to get blood work to make sure your kidneys are doing well with the new medication

## 2017-10-15 ENCOUNTER — Telehealth: Payer: Self-pay | Admitting: Cardiovascular Disease

## 2017-10-15 NOTE — Telephone Encounter (Signed)
New message   Pt c/o medication issue:  1. Name of Medication: chlorthalidone (HYGROTON) 25 MG tablet  2. How are you currently taking this medication (dosage and times per day)? Take 1 tablet (25 mg total) by mouth daily.  3. Are you having a reaction (difficulty breathing--STAT)? Yes  4. What is your medication issue? Having cramps in feet and getting up at night for bathroom

## 2017-10-15 NOTE — Telephone Encounter (Signed)
Patient called w/[facility]'s recommendations. She will have blood work next week as directed and HOLD chlorthalidone.

## 2017-10-15 NOTE — Telephone Encounter (Signed)
Patient recently started on chlorthalidone per 10/10/17 anti-coag visit note.   She started this med on Thursday 1/2, didn't feel well Friday. She states on Saturday night, she woke up with cramps 4 time during the night. She was urinating every 2-2.5 hours Saturday night.   She uses a wrist cuff to monitor her home BP. She reports her BP is in the 170s. Yesterday 1/6, her BP was 180-something and then 153 in the afternoon.   Advised would route to CVRR team to review & advise

## 2017-10-15 NOTE — Telephone Encounter (Signed)
HOLD chlorthalidone for now and repeat blood work in a week as previously scheduled (to check electrolytes and renal function).  Will determine if okay to resume chlorthalidone at 1/2 dose after blood work resulted.

## 2017-10-18 DIAGNOSIS — H43822 Vitreomacular adhesion, left eye: Secondary | ICD-10-CM | POA: Diagnosis not present

## 2017-10-19 ENCOUNTER — Ambulatory Visit: Payer: Medicare HMO | Admitting: Podiatry

## 2017-10-19 ENCOUNTER — Encounter: Payer: Self-pay | Admitting: Podiatry

## 2017-10-19 DIAGNOSIS — M79676 Pain in unspecified toe(s): Secondary | ICD-10-CM

## 2017-10-19 DIAGNOSIS — D689 Coagulation defect, unspecified: Secondary | ICD-10-CM

## 2017-10-19 DIAGNOSIS — B351 Tinea unguium: Secondary | ICD-10-CM

## 2017-10-22 NOTE — Progress Notes (Signed)
Patient ID: OTTO CARAWAY, female   DOB: [birth year], 77 y.o.   MRN: [id number]  Subjective: 77 y.o. returns the office today for painful, elongated, thickened toenails which she cannot trim herself. Denies any redness or drainage around the nails. Denies any acute changes since last appointment and no new complaints today. Denies any systemic complaints such as fevers, chills, nausea, vomiting.   Objective: AAO 3, NAD DP/PT pulses palpable, CRT less than 3 seconds Nails hypertrophic, dystrophic, elongated, brittle, discolored 10. There is tenderness overlying the nails 1-5 bilaterally. There is no surrounding erythema or drainage along the nail sites. No open lesions or pre-ulcerative lesions are identified. No other areas of tenderness bilateral lower extremities. No overlying edema, erythema, increased warmth. No pain with calf compression, swelling, warmth, erythema.  Assessment: Patient presents with symptomatic onychomycosis  Plan: -Treatment options including alternatives, risks, complications were discussed -Nails sharply debrided 10 without complication/bleeding. -Discussed daily foot inspection. If there are any changes, to call the office immediately.  -Follow-up in 3 months or sooner if any problems are to arise. In the meantime, encouraged to call the office with any questions, concerns, changes symptoms.  Celesta Gentile, DPM

## 2017-10-24 ENCOUNTER — Telehealth: Payer: Self-pay | Admitting: *Deleted

## 2017-10-24 DIAGNOSIS — Z5181 Encounter for therapeutic drug level monitoring: Secondary | ICD-10-CM | POA: Diagnosis not present

## 2017-10-24 NOTE — Telephone Encounter (Signed)
Order placed for BMET per phone call 10/15/17

## 2017-10-25 LAB — BASIC METABOLIC PANEL
BUN/Creatinine Ratio: 22 (ref 12–28)
BUN: 17 mg/dL (ref 8–27)
CALCIUM: 9.6 mg/dL (ref 8.7–10.3)
CO2: 24 mmol/L (ref 20–29)
CREATININE: 0.77 mg/dL (ref 0.57–1.00)
Chloride: 97 mmol/L (ref 96–106)
GFR, EST AFRICAN AMERICAN: 86 mL/min/{1.73_m2} (ref >59)
GFR, EST NON AFRICAN AMERICAN: 74 mL/min/{1.73_m2} (ref >59)
Glucose: 210 mg/dL — ABNORMAL HIGH (ref 65–99)
Potassium: 5 mmol/L (ref 3.5–5.2)
Sodium: 135 mmol/L (ref 134–144)

## 2017-10-26 NOTE — Telephone Encounter (Signed)
Appointment with [name] in our hypertension clinic to evaluate blood pressure and current medications

## 2017-10-26 NOTE — Telephone Encounter (Signed)
Patient aware of lab results  She stopped chlorthalidone r/t SE. She reports her BP is still 170s-180s. Will route to MD/pharmacy staff for assistance

## 2017-10-26 NOTE — Telephone Encounter (Signed)
Notes recorded by Lorretta Harp, MD on 10/25/2017 at 11:22 AM EST Labs look good except for elevated glucose.

## 2017-10-29 NOTE — Telephone Encounter (Signed)
Message sent to scheduling to arrange appt. 

## 2017-11-07 ENCOUNTER — Encounter: Payer: Self-pay | Admitting: Pharmacist Clinician (PhC)/ Clinical Pharmacy Specialist

## 2017-11-07 ENCOUNTER — Ambulatory Visit (INDEPENDENT_AMBULATORY_CARE_PROVIDER_SITE_OTHER): Payer: Medicare HMO | Admitting: Pharmacist Clinician (PhC)/ Clinical Pharmacy Specialist

## 2017-11-07 VITALS — BP 170/60

## 2017-11-07 DIAGNOSIS — Z952 Presence of prosthetic heart valve: Secondary | ICD-10-CM

## 2017-11-07 DIAGNOSIS — I10 Essential (primary) hypertension: Secondary | ICD-10-CM | POA: Diagnosis not present

## 2017-11-07 DIAGNOSIS — Z7901 Long term (current) use of anticoagulants: Secondary | ICD-10-CM

## 2017-11-07 LAB — POCT INR: INR: 2.8

## 2017-11-07 MED ORDER — HYDRALAZINE HCL 100 MG PO TABS: 100.0000 mg | ORAL_TABLET | Freq: Three times a day (TID) | ORAL | 1 refills | Status: DC

## 2017-11-07 NOTE — Patient Instructions (Addendum)
Return for a a follow up appointment in 1 month  Your blood pressure today is 170/49  Check your blood pressure at home daily (if able) and keep record of the readings.  Take your BP meds as follows:    Increase hydralazine to 100 mg three times a day  Bring all of your meds, your BP cuff and your record of home blood pressures to your next appointment.  Exercise as you're able, try to walk approximately 30 minutes per day.  Keep salt intake to a minimum, especially watch canned and prepared boxed foods.  Eat more fresh fruits and vegetables and fewer canned items.  Avoid eating in fast food restaurants.    HOW TO TAKE YOUR BLOOD PRESSURE: . Rest 5 minutes before taking your blood pressure. .  Don't smoke or drink caffeinated beverages for at least 30 minutes before. . Take your blood pressure before (not after) you eat. . Sit comfortably with your back supported and both feet on the floor (don't cross your legs). . Elevate your arm to heart level on a table or a desk. . Use the proper sized cuff. It should fit smoothly and snugly around your bare upper arm. There should be enough room to slip a fingertip under the cuff. The bottom edge of the cuff should be 1 inch above the crease of the elbow. . Ideally, take 3 measurements at one sitting and record the average.

## 2017-11-07 NOTE — Assessment & Plan Note (Addendum)
According to the 2017 Hypertension Guidelines, patient's blood pressure goal is < 130/80. Patient is currently not at goal with a blood pressure of 170/60 today in the office and patient's home systolic blood pressure readings ranging in the 170s-180s. I am going to increase the patient's hydralazine to 100 mg TID and have the patient continue her other blood pressure medications. Patient is going to check her blood pressure at home every other day and return to the clinic in 1 month for follow up. Will repeat her BMET in a month. If potassium is normal in a month, will consider adding spironolactone. If her potassium is elevated, will consider adding doxazosin.

## 2017-11-07 NOTE — Progress Notes (Signed)
11/07/2017 [name] 03-03-1941 [id number]   HPI:  [name] is a 77 y.o. female patient of Dr [name], with a PMH significant for hypertension, hyperlipidemia, diabetes, Afib, CAD,PAD, carotid artery disease, valvular heart disease, PVOD, and status post coronary artery bypass grafting x2 with AVR in 2005, who presents today for hypertension clinic evaluation. Patient started on chlorthalidone 25 mg daily on 10/10/17 and on 10/15/17, patient reported to stopping chlorthalidone due to cramps in her feet and urinating frequently. She states that it woke her up 4 times during the night to use the bathroom. Patient reports to taking her blood pressure once a day around 4 pm and that her systolic blood pressure ranges in the 170s-180s normally. Patient states that she has occassional dizziness. She complains about her ankles swelling at night. She denies chest pain, but complains of episodes of her heart fluttering.   Blood Pressure Goal:  <130/80  Current Medications: Metoprolol Succinate 50 mg BID - takes with breakfast and supper Hydralazine 75 mg TID- takes 1.5 tablets (50 mg tablets) three times daily-at breakfast, lunch, and dinner Irbesartan 300 mg qd-takes with breakfast Furosemide 60 mg daily-take 1 and 1/2 tablets of the 40 mg daily-takes with breakfast  Family Hx: Dad-high blood pressure, maybe had heart disease Sisters-high blood pressure  Social Hx: Former cigarette smoker-1 pack/day for 30 years, does not currently smoke, does not drink alcohol  She drinks diet coke or pepsi about 3 times a week.  Diet: Uses a little salt in her food  Exercise: Walks about 15 minutes a day  Home BP readings: Last reading was 179/66 on Friday or Saturday. She uses a wrist cuff.   Intolerances:  Amlodipine-swelling Chlorthalidone 25 mg daily-cramps and frequent urination  Losartan-swelling Verapamil-hives Carvedilol 25 mg BID- headaches   Wt Readings from Last 3 Encounters:    06/20/17 150 lb (68 kg)  01/12/16 149 lb (67.6 kg)  12/11/14 151 lb 3.2 oz (68.6 kg)   BP Readings from Last 3 Encounters:  11/07/17 (!) 170/60  09/25/17 (!) 168/54  06/20/17 (!) 168/66   Pulse Readings from Last 3 Encounters:  06/20/17 60  01/12/16 65  11/23/15 (!) 54    Current Outpatient Medications  Medication Sig Dispense Refill  . ALPRAZolam (XANAX) 0.5 MG tablet Take 0.25-0.5 mg by mouth 2 (two) times daily as needed for anxiety. For anxiety    . aspirin EC 81 MG tablet Take 81 mg by mouth daily. Patient only takes on Mon Wed and Friday    . atorvastatin (LIPITOR) 80 MG tablet Take 1 tablet (80 mg total) by mouth daily. 30 tablet 0  . carvedilol (COREG) 25 MG tablet Take 1 tablet (25 mg total) by mouth 2 (two) times daily with a meal. 60 tablet 1  . chlorthalidone (HYGROTON) 25 MG tablet Take 1 tablet (25 mg total) by mouth daily. (Patient taking differently: Take 25 mg by mouth daily. ON HOLD 10/15/17) 30 tablet 3  . Cholecalciferol (VITAMIN D3) 1000 UNITS CAPS Take 1 capsule by mouth daily.    . furosemide (LASIX) 40 MG tablet Take 60 mg by mouth daily. Takes 1 and 1/2 tablets of the 40mg  daily    . glipiZIDE (GLUCOTROL XL) 2.5 MG 24 hr tablet Take 1 tablet by mouth daily.    . hydrALAZINE (APRESOLINE) 50 MG tablet Take 1.5 tablets (75 mg total) 3 (three) times daily by mouth. 405 tablet 3  . irbesartan (AVAPRO) 300 MG tablet Take 1  tablet (300 mg total) by mouth daily. 30 tablet 0  . metFORMIN (GLUCOPHAGE) 500 MG tablet Take 500 mg by mouth 2 (two) times daily with a meal.     . pantoprazole (PROTONIX) 40 MG tablet Take 40 mg by mouth daily.     . traMADol (ULTRAM) 50 MG tablet Take 50 mg by mouth every 6 (six) hours as needed for moderate pain or severe pain.     . vitamin B-12 (CYANOCOBALAMIN) 100 MCG tablet Take 100 mcg by mouth daily.     [name] warfarin (COUMADIN) 5 MG tablet TAKE 1 TO 1.5 (ONE TO ONE & ONE-HALF) TABLETS BY MOUTH DAILY AS DIRECTED BY COUMADIN CLINIC 135  tablet 1   No current facility-administered medications for this visit.     Allergies  Allergen Reactions  . Losartan Swelling  . Verapamil Hives  . Zetia [Ezetimibe] Other (See Comments)    Reaction=unknown  . Zocor [Simvastatin - High Dose]     Past Medical History:  Diagnosis Date  . Carotid artery disease ([place])   . Chronic anticoagulation   . Coronary artery disease    status post coronary artery bypass grafting times 07/10/2004  . Diabetes mellitus   . Hypercholesteremia   . Hypertension   . Mechanical heart valve present    H. aortic valve replacement at the time of bypass surgery October 2005  . Peripheral arterial disease ([facility])    history of left common iliac artery PTA and stenting for a chronic total occlusion 08/26/01   Labs (Taken 10/24/17): SCr: 0.77 Sodium: 135 Potassium: 5 Calcium: 9.6 BUN: 17  Blood pressure (!) 170/60.   Hypertension According to the 2017 Hypertension Guidelines, patient's blood pressure goal is < 130/80. Patient is currently not at goal with a blood pressure of 170/60 today in the office and patient's home systolic blood pressure readings ranging in the 170s-180s. I am going to increase the patient's hydralazine to 100 mg TID and have the patient continue her other blood pressure medications. Patient is going to check her blood pressure at home every other day and return to the clinic in 1 month for follow up. Will repeat her BMET in a month. If potassium is normal in a month, will consider adding spironolactone. If her potassium is elevated, will consider adding doxazosin.   Jones Broom PharmD Candidate 2019 [place]   This patient was seen by myself as well as PharmD candidate and I agree with the above assessment and plan.   Tommy Medal PharmD CPP [facility] [address] [place], [place] [address] [phone]

## 2017-11-12 NOTE — Telephone Encounter (Signed)
Pt scheduled for appt with PharmD on 12/06/17

## 2017-11-20 DIAGNOSIS — Z7901 Long term (current) use of anticoagulants: Secondary | ICD-10-CM | POA: Diagnosis not present

## 2017-11-20 DIAGNOSIS — Z952 Presence of prosthetic heart valve: Secondary | ICD-10-CM | POA: Diagnosis not present

## 2017-11-20 DIAGNOSIS — M810 Age-related osteoporosis without current pathological fracture: Secondary | ICD-10-CM | POA: Diagnosis not present

## 2017-11-20 DIAGNOSIS — I480 Paroxysmal atrial fibrillation: Secondary | ICD-10-CM | POA: Diagnosis not present

## 2017-11-20 DIAGNOSIS — H9193 Unspecified hearing loss, bilateral: Secondary | ICD-10-CM | POA: Diagnosis not present

## 2017-11-20 DIAGNOSIS — E1151 Type 2 diabetes mellitus with diabetic peripheral angiopathy without gangrene: Secondary | ICD-10-CM | POA: Diagnosis not present

## 2017-11-20 DIAGNOSIS — I6529 Occlusion and stenosis of unspecified carotid artery: Secondary | ICD-10-CM | POA: Diagnosis not present

## 2017-11-20 DIAGNOSIS — E871 Hypo-osmolality and hyponatremia: Secondary | ICD-10-CM | POA: Diagnosis not present

## 2017-11-20 DIAGNOSIS — I7389 Other specified peripheral vascular diseases: Secondary | ICD-10-CM | POA: Diagnosis not present

## 2017-11-22 ENCOUNTER — Other Ambulatory Visit: Payer: Self-pay | Admitting: Cardiovascular Disease

## 2017-11-28 ENCOUNTER — Telehealth: Payer: Self-pay | Admitting: Cardiovascular Disease

## 2017-11-28 NOTE — Telephone Encounter (Signed)
Spoke with patient and she stated that the pain was in her lower left side almost at the waistline. She thinks it is a pulled muscle and only hurts with certain movements like rolling over in bed. She stated she called because her PCP told her to but she is fine and will get an appointment if it gets worse.

## 2017-11-28 NOTE — Telephone Encounter (Signed)
New Message   Patient is calling in reference to a pain that she has in her side. She states that it hurts when she breathes but its not constant. She states that her pcp advised her to call her cardiologist. Please call to discuss.

## 2017-12-06 ENCOUNTER — Encounter: Payer: Self-pay | Admitting: Pharmacist Clinician (PhC)/ Clinical Pharmacy Specialist

## 2017-12-06 ENCOUNTER — Ambulatory Visit (INDEPENDENT_AMBULATORY_CARE_PROVIDER_SITE_OTHER): Payer: Medicare HMO | Admitting: Pharmacist Clinician (PhC)/ Clinical Pharmacy Specialist

## 2017-12-06 VITALS — BP 158/60 | HR 58

## 2017-12-06 DIAGNOSIS — Z952 Presence of prosthetic heart valve: Secondary | ICD-10-CM

## 2017-12-06 DIAGNOSIS — I10 Essential (primary) hypertension: Secondary | ICD-10-CM | POA: Diagnosis not present

## 2017-12-06 DIAGNOSIS — Z7901 Long term (current) use of anticoagulants: Secondary | ICD-10-CM | POA: Diagnosis not present

## 2017-12-06 LAB — POCT INR: INR: 2.8

## 2017-12-06 NOTE — Progress Notes (Signed)
12/06/2017 [name] 10-Sep-1941 [id number]   HPI:  [name] is a 77 y.o. female patient of Dr [name], with a PMH significant for hypertension, hyperlipidemia, diabetes, Afib, CAD,PAD, carotid artery disease, valvular heart disease, PVOD, and status post coronary artery bypass grafting x2 with AVR in 2005, who presents today for hypertension clinic follow up.  We have had some challenges in regulating her BP, as she has been intolerant of several different medications.    Patient started on chlorthalidone 25 mg daily on 10/10/17 and 5 days later had to stop due to cramps in her feet and frequent urination, including getting up about 4 times per night.  She continues to have elevated pressures, that seem to be unchanged, despite increased doses of her medications.   Two weeks ago she saw her PCP, Dr. Osborne Casco, who started her on spironolactone 25 mg daily.  She reports occasional nausea, but otherwise no problems.  .    Blood Pressure Goal:  <130/80  Current Medications: Metoprolol Succinate 50 mg BID - takes with breakfast and supper Hydralazine 100 mg TID Irbesartan 300 mg qd-takes with breakfast Furosemide 60 mg daily-take 1 and 1/2 tablets of the 40 mg daily-takes with breakfast  Family Hx: Dad-high blood pressure, maybe had heart disease Sisters-high blood pressure  Social Hx: Former cigarette smoker-1 pack/day for 30 years, does not currently smoke, does not drink alcohol  She drinks diet coke or pepsi about 3 times a week.  Diet: Uses a little salt in her food  Exercise: Walks about 15 minutes a day  Home BP readings: Did not have her home readings with her today, but notes that they continue to be 326-712 systolic.  She also noted that on 3 or 4 occasions her heart rate dropped into the 40's  Intolerances:  Amlodipine-swelling Chlorthalidone 25 mg daily-cramps and frequent urination  Losartan-swelling Verapamil-hives Carvedilol 25 mg BID-  headaches Spironolactone - patient last potassium level was 5.0 - PCP started this about 2 weeks ago.    Wt Readings from Last 3 Encounters:  06/20/17 150 lb (68 kg)  01/12/16 149 lb (67.6 kg)  12/11/14 151 lb 3.2 oz (68.6 kg)   BP Readings from Last 3 Encounters:  12/06/17 (!) 158/60  11/07/17 (!) 170/60  09/25/17 (!) 168/54   Pulse Readings from Last 3 Encounters:  12/06/17 (!) 58  06/20/17 60  01/12/16 65    Current Outpatient Medications  Medication Sig Dispense Refill  . ALPRAZolam (XANAX) 0.5 MG tablet Take 0.25-0.5 mg by mouth 2 (two) times daily as needed for anxiety. For anxiety    . aspirin EC 81 MG tablet Take 81 mg by mouth daily. Patient only takes on Mon Wed and Friday    . atorvastatin (LIPITOR) 80 MG tablet Take 1 tablet (80 mg total) by mouth daily. 30 tablet 0  . carvedilol (COREG) 25 MG tablet TAKE ONE TABLET BY MOUTH TWICE A DAY WITH MEALS 120 tablet 3  . Cholecalciferol (VITAMIN D3) 1000 UNITS CAPS Take 1 capsule by mouth daily.    . furosemide (LASIX) 40 MG tablet Take 60 mg by mouth daily. Takes 1 and 1/2 tablets of the 40mg  daily    . glipiZIDE (GLUCOTROL XL) 2.5 MG 24 hr tablet Take 1 tablet by mouth daily.    . hydrALAZINE (APRESOLINE) 100 MG tablet Take 1 tablet (100 mg total) by mouth 3 (three) times daily. 270 tablet 1  . irbesartan (AVAPRO) 300 MG tablet Take 1  tablet (300 mg total) by mouth daily. 30 tablet 0  . metFORMIN (GLUCOPHAGE) 500 MG tablet Take 500 mg by mouth 2 (two) times daily with a meal.     . pantoprazole (PROTONIX) 40 MG tablet Take 40 mg by mouth daily.     . traMADol (ULTRAM) 50 MG tablet Take 50 mg by mouth every 6 (six) hours as needed for moderate pain or severe pain.     . vitamin B-12 (CYANOCOBALAMIN) 100 MCG tablet Take 100 mcg by mouth daily.     [name] warfarin (COUMADIN) 5 MG tablet TAKE 1 TO 1.5 (ONE TO ONE & ONE-HALF) TABLETS BY MOUTH DAILY AS DIRECTED BY COUMADIN CLINIC 135 tablet 1   No current facility-administered  medications for this visit.     Allergies  Allergen Reactions  . Losartan Swelling  . Verapamil Hives  . Zetia [Ezetimibe] Other (See Comments)    Reaction=unknown  . Zocor [Simvastatin - High Dose]     Past Medical History:  Diagnosis Date  . Carotid artery disease ([place])   . Chronic anticoagulation   . Coronary artery disease    status post coronary artery bypass grafting times 07/10/2004  . Diabetes mellitus   . Hypercholesteremia   . Hypertension   . Mechanical heart valve present    H. aortic valve replacement at the time of bypass surgery October 2005  . Peripheral arterial disease ([facility])    history of left common iliac artery PTA and stenting for a chronic total occlusion 08/26/01   Labs (Taken 10/24/17): SCr: 0.77 Sodium: 135 Potassium: 5 Calcium: 9.6 BUN: 17  Blood pressure (!) 158/60, pulse (!) 58.   Hypertension Patient with difficult to control hypertension, now on maximum doses of irbesartan and hydralazine, and unable to further increase metoprolol due to bradycardia.  Her PCP started her on spironolactone about 2 weeks ago, and it has yet to make an impact on her pressures.  Will get a BMET today before going any further, as she previously had a potassium level of 5.0.  If she cannot continue with the spironolactone, I would recommend we try doxazosin, starting with 2 mg and titrate up as tolerated.  I would also consider switching her hydralazine to minoxidil as she had no change in pressure even up to 100 mg tid.  We will let her know about the lab results on Friday (March 1) and see her again in the office in 3 weeks.      Tommy Medal PharmD CPP [facility] [address] [place], [place] [address] [phone]

## 2017-12-06 NOTE — Patient Instructions (Signed)
Return for a a follow up appointment in 3 weeks  We will call you tomorrow with the results of your blood work.  Your blood pressure today is 158/60  Check your blood pressure at home daily and keep record of the readings.  Take your BP meds as follows:  Continue with all your current medications.  Raquel or I will call you tomorrow with the results of your blood work and any medication changes.  Bring all of your meds, your BP cuff and your record of home blood pressures to your next appointment.  Exercise as you're able, try to walk approximately 30 minutes per day.  Keep salt intake to a minimum, especially watch canned and prepared boxed foods.  Eat more fresh fruits and vegetables and fewer canned items.  Avoid eating in fast food restaurants.    HOW TO TAKE YOUR BLOOD PRESSURE: . Rest 5 minutes before taking your blood pressure. .  Don't smoke or drink caffeinated beverages for at least 30 minutes before. . Take your blood pressure before (not after) you eat. . Sit comfortably with your back supported and both feet on the floor (don't cross your legs). . Elevate your arm to heart level on a table or a desk. . Use the proper sized cuff. It should fit smoothly and snugly around your bare upper arm. There should be enough room to slip a fingertip under the cuff. The bottom edge of the cuff should be 1 inch above the crease of the elbow. . Ideally, take 3 measurements at one sitting and record the average.

## 2017-12-06 NOTE — Assessment & Plan Note (Signed)
Patient with difficult to control hypertension, now on maximum doses of irbesartan and hydralazine, and unable to further increase metoprolol due to bradycardia.  Her PCP started her on spironolactone about 2 weeks ago, and it has yet to make an impact on her pressures.  Will get a BMET today before going any further, as she previously had a potassium level of 5.0.  If she cannot continue with the spironolactone, I would recommend we try doxazosin, starting with 2 mg and titrate up as tolerated.  I would also consider switching her hydralazine to minoxidil as she had no change in pressure even up to 100 mg tid.  We will let her know about the lab results on Friday (March 1) and see her again in the office in 3 weeks.

## 2017-12-07 LAB — BASIC METABOLIC PANEL
BUN/Creatinine Ratio: 21 (ref 12–28)
BUN: 20 mg/dL (ref 8–27)
CO2: 22 mmol/L (ref 20–29)
CREATININE: 0.94 mg/dL (ref 0.57–1.00)
Calcium: 8.9 mg/dL (ref 8.7–10.3)
Chloride: 100 mmol/L (ref 96–106)
GFR calc non Af Amer: 58 mL/min/{1.73_m2} — ABNORMAL LOW (ref >59)
GFR, EST AFRICAN AMERICAN: 67 mL/min/{1.73_m2} (ref >59)
Glucose: 144 mg/dL — ABNORMAL HIGH (ref 65–99)
POTASSIUM: 4.5 mmol/L (ref 3.5–5.2)
SODIUM: 137 mmol/L (ref 134–144)

## 2017-12-14 DIAGNOSIS — N390 Urinary tract infection, site not specified: Secondary | ICD-10-CM | POA: Diagnosis not present

## 2017-12-14 DIAGNOSIS — Z6826 Body mass index (BMI) 26.0-26.9, adult: Secondary | ICD-10-CM | POA: Diagnosis not present

## 2017-12-14 DIAGNOSIS — J3489 Other specified disorders of nose and nasal sinuses: Secondary | ICD-10-CM | POA: Diagnosis not present

## 2017-12-14 DIAGNOSIS — R110 Nausea: Secondary | ICD-10-CM | POA: Diagnosis not present

## 2017-12-14 DIAGNOSIS — I10 Essential (primary) hypertension: Secondary | ICD-10-CM | POA: Diagnosis not present

## 2017-12-14 DIAGNOSIS — R1084 Generalized abdominal pain: Secondary | ICD-10-CM | POA: Diagnosis not present

## 2017-12-14 DIAGNOSIS — R040 Epistaxis: Secondary | ICD-10-CM | POA: Diagnosis not present

## 2017-12-27 ENCOUNTER — Ambulatory Visit: Payer: Medicare HMO | Admitting: Pharmacist

## 2017-12-27 VITALS — BP 152/50 | HR 62

## 2017-12-27 DIAGNOSIS — I10 Essential (primary) hypertension: Secondary | ICD-10-CM

## 2017-12-27 NOTE — Progress Notes (Signed)
HPI:  [name] is a 77 y.o. female patient of Dr [name], with a PMH significant for hypertension, hyperlipidemia, diabetes, Afib, CAD,PAD, carotid artery disease, valvular heart disease, PVOD, and status post coronary artery bypass grafting x 2 with AVR in 2005, who presents today for hypertension clinic follow up.  We have had some challenges in regulating her BP, as she has been intolerant of several medications.  She presents today with complaint of upset stomach and potential "gallbladder acting out". Patient reports she stopped taking spironolactone after GI symptoms worsen few days ago.  Blood Pressure Goal:  130/80 (140/90 if unable to tolerate)  Current Medications: Metoprolol Succinate 50 mg BID - takes with breakfast and supper Hydralazine 100 mg TID Irbesartan 300 mg qd-takes with breakfast Furosemide 60 mg daily-take 1 and 1/2 tablets of the 40 mg daily-takes with breakfast  Family History: Dad-high blood pressure, maybe had heart disease Sisters-high blood pressure  Social History: Former cigarette smoker-1 pack/day for 30 years, does not currently smoke, does not drink alcohol  She drinks diet coke or pepsi about 3 times a week.  Diet: Uses a little salt in her food  Exercise: Walks about 15 minutes a day  Home BP readings: Did not have her home readings with her today, but notes that they continue to be 329-518 systolic.  She also noted that on 3 or 4 occasions her heart rate dropped into the 40's  Intolerances:  Amlodipine-swelling Chlorthalidone 25 mg daily-cramps and frequent urination  Losartan-swelling Verapamil-hives Carvedilol 25 mg BID- headaches Spironolactone 25mg  daily - stopped taking - nausea  Wt Readings from Last 3 Encounters:  06/20/17 150 lb (68 kg)  01/12/16 149 lb (67.6 kg)  12/11/14 151 lb 3.2 oz (68.6 kg)   BP Readings from Last 3 Encounters:  12/27/17 (!) 152/50  12/06/17 (!) 158/60  11/07/17 (!) 170/60   Pulse Readings from  Last 3 Encounters:  12/27/17 62  12/06/17 (!) 58  06/20/17 60    Current Outpatient Medications  Medication Sig Dispense Refill  . ALPRAZolam (XANAX) 0.5 MG tablet Take 0.25-0.5 mg by mouth 2 (two) times daily as needed for anxiety. For anxiety    . aspirin EC 81 MG tablet Take 81 mg by mouth daily. Patient only takes on Mon Wed and Friday    . atorvastatin (LIPITOR) 80 MG tablet Take 1 tablet (80 mg total) by mouth daily. 30 tablet 0  . carvedilol (COREG) 25 MG tablet TAKE ONE TABLET BY MOUTH TWICE A DAY WITH MEALS 120 tablet 3  . Cholecalciferol (VITAMIN D3) 1000 UNITS CAPS Take 1 capsule by mouth daily.    . furosemide (LASIX) 40 MG tablet Take 60 mg by mouth daily. Takes 1 and 1/2 tablets of the 40mg  daily    . glipiZIDE (GLUCOTROL XL) 2.5 MG 24 hr tablet Take 1 tablet by mouth daily.    . hydrALAZINE (APRESOLINE) 100 MG tablet Take 1 tablet (100 mg total) by mouth 3 (three) times daily. 270 tablet 1  . irbesartan (AVAPRO) 300 MG tablet Take 1 tablet (300 mg total) by mouth daily. 30 tablet 0  . metFORMIN (GLUCOPHAGE) 500 MG tablet Take 500 mg by mouth 2 (two) times daily with a meal.     . pantoprazole (PROTONIX) 40 MG tablet Take 40 mg by mouth daily.     . traMADol (ULTRAM) 50 MG tablet Take 50 mg by mouth every 6 (six) hours as needed for moderate pain or severe pain.     [name]  vitamin B-12 (CYANOCOBALAMIN) 100 MCG tablet Take 100 mcg by mouth daily.     [name] warfarin (COUMADIN) 5 MG tablet TAKE 1 TO 1.5 (ONE TO ONE & ONE-HALF) TABLETS BY MOUTH DAILY AS DIRECTED BY COUMADIN CLINIC 135 tablet 1   No current facility-administered medications for this visit.     Allergies  Allergen Reactions  . Losartan Swelling  . Verapamil Hives  . Zetia [Ezetimibe] Other (See Comments)    Reaction=unknown  . Zocor [Simvastatin - High Dose]     Past Medical History:  Diagnosis Date  . Carotid artery disease ([place])   . Chronic anticoagulation   . Coronary artery disease    status post coronary  artery bypass grafting times 07/10/2004  . Diabetes mellitus   . Hypercholesteremia   . Hypertension   . Mechanical heart valve present    H. aortic valve replacement at the time of bypass surgery October 2005  . Peripheral arterial disease ([facility])    history of left common iliac artery PTA and stenting for a chronic total occlusion 08/26/01    Blood pressure (!) 152/50, pulse 62, SpO2 98 %.   Hypertension Blood pressure remains elevated above goal of 140/90 but readings are significantly better than previous results of 943E and 761Y systolic.  Patient only complain today is her GI problems and expressed desire of keeping current therapy unchanged until she can feel better. Will continue current therapy without changes and follow up at the coumadin/HTN clinic in 3 weeks.  Denzell Colasanti Rodriguez-Guzman PharmD, BCPS, [place] [address] 12/31/2017 5:04 PM

## 2017-12-27 NOTE — Patient Instructions (Signed)
Return for a  follow up appointment in 4 weeks  Check your blood pressure at home daily (if able) and keep record of the readings.  Take your BP meds as follows: *NO CHANGE in MEDICATION*  Bring all of your meds, your BP cuff and your record of home blood pressures to your next appointment.  Exercise as you're able, try to walk approximately 30 minutes per day.  Keep salt intake to a minimum, especially watch canned and prepared boxed foods.  Eat more fresh fruits and vegetables and fewer canned items.  Avoid eating in fast food restaurants.    HOW TO TAKE YOUR BLOOD PRESSURE: . Rest 5 minutes before taking your blood pressure. .  Don't smoke or drink caffeinated beverages for at least 30 minutes before. . Take your blood pressure before (not after) you eat. . Sit comfortably with your back supported and both feet on the floor (don't cross your legs). . Elevate your arm to heart level on a table or a desk. . Use the proper sized cuff. It should fit smoothly and snugly around your bare upper arm. There should be enough room to slip a fingertip under the cuff. The bottom edge of the cuff should be 1 inch above the crease of the elbow. . Ideally, take 3 measurements at one sitting and record the average.

## 2017-12-28 ENCOUNTER — Encounter: Payer: Self-pay | Admitting: Podiatry

## 2017-12-28 ENCOUNTER — Ambulatory Visit: Payer: Medicare HMO | Admitting: Podiatry

## 2017-12-28 DIAGNOSIS — B351 Tinea unguium: Secondary | ICD-10-CM

## 2017-12-28 DIAGNOSIS — M79676 Pain in unspecified toe(s): Secondary | ICD-10-CM

## 2017-12-28 DIAGNOSIS — D689 Coagulation defect, unspecified: Secondary | ICD-10-CM

## 2017-12-28 NOTE — Progress Notes (Signed)
Patient ID: [name], female   DOB: 1940/12/23, 77 y.o.   MRN: [id number]  Subjective: 77 y.o. returns the office today for painful, elongated, thickened toenails which she cannot trim herself. Denies any redness or drainage around the nails. Denies any acute changes since last appointment and no new complaints today. Denies any systemic complaints such as fevers, chills, nausea, vomiting.   Objective: AAO 3, NAD DP/PT pulses palpable, CRT less than 3 seconds Nails hypertrophic, dystrophic, elongated, brittle, discolored 10. There is tenderness overlying the nails 1-5 bilaterally. There is no surrounding erythema or drainage along the nail sites. No open lesions or pre-ulcerative lesions are identified. No other areas of tenderness bilateral lower extremities. No overlying edema, erythema, increased warmth. No pain with calf compression, swelling, warmth, erythema.  Assessment: Patient presents with symptomatic onychomycosis  Plan: -Treatment options including alternatives, risks, complications were discussed -Nails sharply debrided 10 without complication/bleeding. -Discussed daily foot inspection. If there are any changes, to call the office immediately.  -Follow-up in 3 months or sooner if any problems are to arise. In the meantime, encouraged to call the office with any questions, concerns, changes symptoms.  Celesta Gentile, DPM

## 2017-12-31 ENCOUNTER — Encounter: Payer: Self-pay | Admitting: Pharmacist

## 2017-12-31 NOTE — Assessment & Plan Note (Signed)
Blood pressure remains elevated above goal of 140/90 but readings are significantly better than previous results of 886Y and 847U systolic.  Patient only complain today is her GI problems and expressed desire of keeping current therapy unchanged until she can feel better. Will continue current therapy without changes and follow up at the coumadin/HTN clinic in 3 weeks.

## 2018-01-01 DIAGNOSIS — Z8719 Personal history of other diseases of the digestive system: Secondary | ICD-10-CM | POA: Diagnosis not present

## 2018-01-01 DIAGNOSIS — R110 Nausea: Secondary | ICD-10-CM | POA: Diagnosis not present

## 2018-01-01 DIAGNOSIS — Z7901 Long term (current) use of anticoagulants: Secondary | ICD-10-CM | POA: Diagnosis not present

## 2018-01-01 DIAGNOSIS — N390 Urinary tract infection, site not specified: Secondary | ICD-10-CM | POA: Diagnosis not present

## 2018-01-01 DIAGNOSIS — K219 Gastro-esophageal reflux disease without esophagitis: Secondary | ICD-10-CM | POA: Diagnosis not present

## 2018-01-01 DIAGNOSIS — R109 Unspecified abdominal pain: Secondary | ICD-10-CM | POA: Diagnosis not present

## 2018-01-07 DIAGNOSIS — H43822 Vitreomacular adhesion, left eye: Secondary | ICD-10-CM | POA: Diagnosis not present

## 2018-01-08 ENCOUNTER — Encounter: Payer: Self-pay | Admitting: Internal Medicine

## 2018-01-09 ENCOUNTER — Other Ambulatory Visit: Payer: Self-pay | Admitting: Gastroenterology

## 2018-01-09 DIAGNOSIS — R1011 Right upper quadrant pain: Secondary | ICD-10-CM

## 2018-01-09 DIAGNOSIS — R110 Nausea: Secondary | ICD-10-CM

## 2018-01-09 DIAGNOSIS — Z7901 Long term (current) use of anticoagulants: Secondary | ICD-10-CM | POA: Diagnosis not present

## 2018-01-17 ENCOUNTER — Telehealth: Payer: Self-pay | Admitting: Cardiovascular Disease

## 2018-01-17 ENCOUNTER — Ambulatory Visit
Admission: RE | Admit: 2018-01-17 | Discharge: 2018-01-17 | Disposition: A | Discharge status: Home / Self Care | Payer: Medicare HMO | Source: Ambulatory Visit | Attending: Gastroenterology | Admitting: Gastroenterology

## 2018-01-17 ENCOUNTER — Ambulatory Visit: Payer: Medicare HMO

## 2018-01-17 DIAGNOSIS — K8020 Calculus of gallbladder without cholecystitis without obstruction: Secondary | ICD-10-CM | POA: Diagnosis not present

## 2018-01-17 DIAGNOSIS — R1011 Right upper quadrant pain: Secondary | ICD-10-CM

## 2018-01-17 DIAGNOSIS — R110 Nausea: Secondary | ICD-10-CM

## 2018-01-17 DIAGNOSIS — D508 Other iron deficiency anemias: Secondary | ICD-10-CM | POA: Diagnosis not present

## 2018-01-17 NOTE — Telephone Encounter (Signed)
Returned call to patient, patient reports she is having GI issues.   Reports he hemoglobin was 8 last week and was sent for a gallbladder US and more blood work this week.  She is awaiting those results.   She is being followed by a GI doctor for this.   She states they will transfuse her if it has dropped any lower.  She is very weak and is experiencing SOB but believes this is all from the low hmg.   She was concerned about this effecting her valve.   She has not had any signs of bleeding.  Advised to continue to follow up with Gi doctor for workup and treatment.   She verbalized understanding and is aware to call back with any further issues or concerns.    Routed to MD to make aware.

## 2018-01-17 NOTE — Telephone Encounter (Signed)
New Message    Patient is calling and requesting to speak with a nurse. She did not want to provide a lot of information. She just states that its about her hemoglobin.

## 2018-01-18 ENCOUNTER — Inpatient Hospital Stay (HOSPITAL_COMMUNITY)
Admission: EM | Admit: 2018-01-18 | Discharge: 2018-01-30 | DRG: 378 | Disposition: A | Discharge status: Home Health | Payer: Medicare HMO | Attending: Internal Medicine | Admitting: Internal Medicine

## 2018-01-18 ENCOUNTER — Encounter (HOSPITAL_COMMUNITY): Payer: Self-pay | Admitting: *Deleted

## 2018-01-18 ENCOUNTER — Emergency Department (HOSPITAL_COMMUNITY): Payer: Medicare HMO

## 2018-01-18 ENCOUNTER — Other Ambulatory Visit: Payer: Self-pay

## 2018-01-18 DIAGNOSIS — K8020 Calculus of gallbladder without cholecystitis without obstruction: Secondary | ICD-10-CM | POA: Diagnosis present

## 2018-01-18 DIAGNOSIS — K5730 Diverticulosis of large intestine without perforation or abscess without bleeding: Secondary | ICD-10-CM | POA: Diagnosis present

## 2018-01-18 DIAGNOSIS — R0682 Tachypnea, not elsewhere classified: Secondary | ICD-10-CM | POA: Diagnosis present

## 2018-01-18 DIAGNOSIS — I5032 Chronic diastolic (congestive) heart failure: Secondary | ICD-10-CM | POA: Diagnosis present

## 2018-01-18 DIAGNOSIS — E861 Hypovolemia: Secondary | ICD-10-CM | POA: Diagnosis present

## 2018-01-18 DIAGNOSIS — Z952 Presence of prosthetic heart valve: Secondary | ICD-10-CM | POA: Diagnosis not present

## 2018-01-18 DIAGNOSIS — Z7982 Long term (current) use of aspirin: Secondary | ICD-10-CM | POA: Diagnosis not present

## 2018-01-18 DIAGNOSIS — R21 Rash and other nonspecific skin eruption: Secondary | ICD-10-CM | POA: Diagnosis present

## 2018-01-18 DIAGNOSIS — Z79899 Other long term (current) drug therapy: Secondary | ICD-10-CM

## 2018-01-18 DIAGNOSIS — Z7984 Long term (current) use of oral hypoglycemic drugs: Secondary | ICD-10-CM

## 2018-01-18 DIAGNOSIS — K2901 Acute gastritis with bleeding: Secondary | ICD-10-CM | POA: Diagnosis not present

## 2018-01-18 DIAGNOSIS — M79603 Pain in arm, unspecified: Secondary | ICD-10-CM

## 2018-01-18 DIAGNOSIS — D696 Thrombocytopenia, unspecified: Secondary | ICD-10-CM

## 2018-01-18 DIAGNOSIS — I700 Atherosclerosis of aorta: Secondary | ICD-10-CM

## 2018-01-18 DIAGNOSIS — E119 Type 2 diabetes mellitus without complications: Secondary | ICD-10-CM

## 2018-01-18 DIAGNOSIS — M79602 Pain in left arm: Secondary | ICD-10-CM | POA: Diagnosis not present

## 2018-01-18 DIAGNOSIS — R1010 Upper abdominal pain, unspecified: Secondary | ICD-10-CM | POA: Diagnosis not present

## 2018-01-18 DIAGNOSIS — K640 First degree hemorrhoids: Secondary | ICD-10-CM | POA: Diagnosis present

## 2018-01-18 DIAGNOSIS — Y92009 Unspecified place in unspecified non-institutional (private) residence as the place of occurrence of the external cause: Secondary | ICD-10-CM | POA: Diagnosis not present

## 2018-01-18 DIAGNOSIS — E871 Hypo-osmolality and hyponatremia: Secondary | ICD-10-CM | POA: Diagnosis present

## 2018-01-18 DIAGNOSIS — I110 Hypertensive heart disease with heart failure: Secondary | ICD-10-CM | POA: Diagnosis present

## 2018-01-18 DIAGNOSIS — E538 Deficiency of other specified B group vitamins: Secondary | ICD-10-CM | POA: Diagnosis present

## 2018-01-18 DIAGNOSIS — M79601 Pain in right arm: Secondary | ICD-10-CM | POA: Diagnosis not present

## 2018-01-18 DIAGNOSIS — R011 Cardiac murmur, unspecified: Secondary | ICD-10-CM | POA: Diagnosis present

## 2018-01-18 DIAGNOSIS — R0602 Shortness of breath: Secondary | ICD-10-CM

## 2018-01-18 DIAGNOSIS — E1151 Type 2 diabetes mellitus with diabetic peripheral angiopathy without gangrene: Secondary | ICD-10-CM | POA: Diagnosis present

## 2018-01-18 DIAGNOSIS — I4891 Unspecified atrial fibrillation: Secondary | ICD-10-CM | POA: Diagnosis present

## 2018-01-18 DIAGNOSIS — R109 Unspecified abdominal pain: Secondary | ICD-10-CM | POA: Diagnosis not present

## 2018-01-18 DIAGNOSIS — I482 Chronic atrial fibrillation: Secondary | ICD-10-CM | POA: Diagnosis present

## 2018-01-18 DIAGNOSIS — K3189 Other diseases of stomach and duodenum: Secondary | ICD-10-CM | POA: Diagnosis not present

## 2018-01-18 DIAGNOSIS — Z951 Presence of aortocoronary bypass graft: Secondary | ICD-10-CM

## 2018-01-18 DIAGNOSIS — K8010 Calculus of gallbladder with chronic cholecystitis without obstruction: Secondary | ICD-10-CM | POA: Diagnosis not present

## 2018-01-18 DIAGNOSIS — R531 Weakness: Secondary | ICD-10-CM | POA: Diagnosis not present

## 2018-01-18 DIAGNOSIS — K922 Gastrointestinal hemorrhage, unspecified: Principal | ICD-10-CM | POA: Diagnosis present

## 2018-01-18 DIAGNOSIS — D649 Anemia, unspecified: Secondary | ICD-10-CM

## 2018-01-18 DIAGNOSIS — Z888 Allergy status to other drugs, medicaments and biological substances status: Secondary | ICD-10-CM

## 2018-01-18 DIAGNOSIS — Z7901 Long term (current) use of anticoagulants: Secondary | ICD-10-CM

## 2018-01-18 DIAGNOSIS — E876 Hypokalemia: Secondary | ICD-10-CM | POA: Diagnosis present

## 2018-01-18 DIAGNOSIS — I2510 Atherosclerotic heart disease of native coronary artery without angina pectoris: Secondary | ICD-10-CM | POA: Diagnosis present

## 2018-01-18 DIAGNOSIS — K219 Gastro-esophageal reflux disease without esophagitis: Secondary | ICD-10-CM | POA: Diagnosis present

## 2018-01-18 DIAGNOSIS — E785 Hyperlipidemia, unspecified: Secondary | ICD-10-CM | POA: Diagnosis present

## 2018-01-18 DIAGNOSIS — L538 Other specified erythematous conditions: Secondary | ICD-10-CM | POA: Diagnosis present

## 2018-01-18 DIAGNOSIS — K645 Perianal venous thrombosis: Secondary | ICD-10-CM | POA: Diagnosis present

## 2018-01-18 DIAGNOSIS — R0902 Hypoxemia: Secondary | ICD-10-CM

## 2018-01-18 DIAGNOSIS — Z87891 Personal history of nicotine dependence: Secondary | ICD-10-CM | POA: Diagnosis not present

## 2018-01-18 DIAGNOSIS — T45515A Adverse effect of anticoagulants, initial encounter: Secondary | ICD-10-CM | POA: Diagnosis present

## 2018-01-18 DIAGNOSIS — E7800 Pure hypercholesterolemia, unspecified: Secondary | ICD-10-CM | POA: Diagnosis present

## 2018-01-18 DIAGNOSIS — I445 Left posterior fascicular block: Secondary | ICD-10-CM | POA: Diagnosis present

## 2018-01-18 DIAGNOSIS — R042 Hemoptysis: Secondary | ICD-10-CM | POA: Diagnosis not present

## 2018-01-18 DIAGNOSIS — D509 Iron deficiency anemia, unspecified: Secondary | ICD-10-CM | POA: Diagnosis not present

## 2018-01-18 DIAGNOSIS — I10 Essential (primary) hypertension: Secondary | ICD-10-CM | POA: Diagnosis not present

## 2018-01-18 HISTORY — DX: Unspecified atrial fibrillation: I48.91

## 2018-01-18 HISTORY — DX: Calculus of gallbladder without cholecystitis without obstruction: K80.20

## 2018-01-18 HISTORY — DX: Atherosclerosis of aorta: I70.0

## 2018-01-18 LAB — BASIC METABOLIC PANEL
Anion gap: 11 (ref 5–15)
BUN: 12 mg/dL (ref 6–20)
CO2: 18 mmol/L — ABNORMAL LOW (ref 22–32)
CREATININE: 0.78 mg/dL (ref 0.44–1.00)
Calcium: 8.6 mg/dL — ABNORMAL LOW (ref 8.9–10.3)
Chloride: 104 mmol/L (ref 101–111)
GFR calc Af Amer: >60 mL/min (ref >60)
Glucose, Bld: 194 mg/dL — ABNORMAL HIGH (ref 65–99)
POTASSIUM: 4.5 mmol/L (ref 3.5–5.1)
SODIUM: 133 mmol/L — AB (ref 135–145)

## 2018-01-18 LAB — ABO/RH: ABO/RH(D): A POS

## 2018-01-18 LAB — CBC WITH DIFFERENTIAL/PLATELET
Basophils Absolute: 0 10*3/uL (ref 0.0–0.1)
Basophils Relative: 1 %
EOS ABS: 0.1 10*3/uL (ref 0.0–0.7)
Eosinophils Relative: 1 %
HEMATOCRIT: 23.2 % — AB (ref 36.0–46.0)
HEMOGLOBIN: 7.1 g/dL — AB (ref 12.0–15.0)
LYMPHS ABS: 0.9 10*3/uL (ref 0.7–4.0)
LYMPHS PCT: 23 %
MCH: 23.4 pg — AB (ref 26.0–34.0)
MCHC: 30.6 g/dL (ref 30.0–36.0)
MCV: 76.3 fL — AB (ref 78.0–100.0)
MONOS PCT: 6 %
Monocytes Absolute: 0.2 10*3/uL (ref 0.1–1.0)
NEUTROS PCT: 69 %
Neutro Abs: 2.7 10*3/uL (ref 1.7–7.7)
Platelets: 183 10*3/uL (ref 150–400)
RBC: 3.04 MIL/uL — ABNORMAL LOW (ref 3.87–5.11)
RDW: 16.6 % — ABNORMAL HIGH (ref 11.5–15.5)
WBC: 3.8 10*3/uL — ABNORMAL LOW (ref 4.0–10.5)

## 2018-01-18 LAB — URINALYSIS, ROUTINE W REFLEX MICROSCOPIC
Bilirubin Urine: NEGATIVE
Glucose, UA: NEGATIVE mg/dL
HGB URINE DIPSTICK: NEGATIVE
Ketones, ur: NEGATIVE mg/dL
NITRITE: NEGATIVE
PH: 7 (ref 5.0–8.0)
Protein, ur: 100 mg/dL — AB
SPECIFIC GRAVITY, URINE: 1.006 (ref 1.005–1.030)
Squamous Epithelial / LPF: NONE SEEN

## 2018-01-18 LAB — CBC
HCT: 23.2 % — ABNORMAL LOW (ref 36.0–46.0)
Hemoglobin: 7.1 g/dL — ABNORMAL LOW (ref 12.0–15.0)
MCH: 23.5 pg — ABNORMAL LOW (ref 26.0–34.0)
MCHC: 30.6 g/dL (ref 30.0–36.0)
MCV: 76.8 fL — ABNORMAL LOW (ref 78.0–100.0)
PLATELETS: 241 10*3/uL (ref 150–400)
RBC: 3.02 MIL/uL — ABNORMAL LOW (ref 3.87–5.11)
RDW: 16.2 % — ABNORMAL HIGH (ref 11.5–15.5)
WBC: 4 10*3/uL (ref 4.0–10.5)

## 2018-01-18 LAB — PROTIME-INR
INR: 2.44
PROTHROMBIN TIME: 26.3 s — AB (ref 11.4–15.2)

## 2018-01-18 LAB — HEMOGLOBIN AND HEMATOCRIT, BLOOD
HCT: 25.5 % — ABNORMAL LOW (ref 36.0–46.0)
Hemoglobin: 7.9 g/dL — ABNORMAL LOW (ref 12.0–15.0)

## 2018-01-18 LAB — POC OCCULT BLOOD, ED: Fecal Occult Bld: POSITIVE — AB

## 2018-01-18 LAB — PREPARE RBC (CROSSMATCH)

## 2018-01-18 MED ORDER — ASPIRIN EC 81 MG PO TBEC
81.0000 mg | DELAYED_RELEASE_TABLET | Freq: Every day | ORAL | Status: DC
  Administered 2018-01-18 – 2018-01-30 (×13): 81 mg via ORAL
  Filled 2018-01-18 (×14): qty 1

## 2018-01-18 MED ORDER — POLYETHYLENE GLYCOL 3350 17 G PO PACK
17.0000 g | PACK | Freq: Every day | ORAL | Status: DC | PRN
  Filled 2018-01-18: qty 1

## 2018-01-18 MED ORDER — WARFARIN - PHARMACIST DOSING INPATIENT: Freq: Every day | Status: DC

## 2018-01-18 MED ORDER — FUROSEMIDE 40 MG PO TABS
60.0000 mg | ORAL_TABLET | Freq: Every day | ORAL | Status: DC
  Administered 2018-01-19 – 2018-01-25 (×7): 60 mg via ORAL
  Filled 2018-01-18: qty 1
  Filled 2018-01-18: qty 3
  Filled 2018-01-18 (×2): qty 1
  Filled 2018-01-18 (×3): qty 3
  Filled 2018-01-18: qty 1

## 2018-01-18 MED ORDER — WARFARIN SODIUM 5 MG PO TABS: 5.0000 mg | ORAL_TABLET | ORAL | Status: DC

## 2018-01-18 MED ORDER — IRBESARTAN 300 MG PO TABS
300.0000 mg | ORAL_TABLET | Freq: Every day | ORAL | Status: DC
  Administered 2018-01-19 – 2018-01-30 (×12): 300 mg via ORAL
  Filled 2018-01-18 (×12): qty 1

## 2018-01-18 MED ORDER — SODIUM CHLORIDE 0.9% FLUSH
3.0000 mL | Freq: Two times a day (BID) | INTRAVENOUS | Status: DC
  Administered 2018-01-19 – 2018-01-30 (×12): 3 mL via INTRAVENOUS

## 2018-01-18 MED ORDER — IOPAMIDOL (ISOVUE-300) INJECTION 61%
INTRAVENOUS | Status: AC
  Filled 2018-01-18: qty 100

## 2018-01-18 MED ORDER — TRAMADOL HCL 50 MG PO TABS
50.0000 mg | ORAL_TABLET | Freq: Four times a day (QID) | ORAL | Status: DC | PRN
  Administered 2018-01-19 – 2018-01-29 (×17): 50 mg via ORAL
  Filled 2018-01-18 (×20): qty 1

## 2018-01-18 MED ORDER — SODIUM CHLORIDE 0.9 % IV SOLN
Freq: Once | INTRAVENOUS | Status: AC
  Administered 2018-01-18: 15:00:00 via INTRAVENOUS

## 2018-01-18 MED ORDER — CARVEDILOL 12.5 MG PO TABS: 25.0000 mg | ORAL_TABLET | Freq: Two times a day (BID) | ORAL | Status: DC

## 2018-01-18 MED ORDER — WARFARIN SODIUM 7.5 MG PO TABS: 7.5000 mg | ORAL_TABLET | ORAL | Status: DC

## 2018-01-18 MED ORDER — VITAMIN B-12 100 MCG PO TABS
100.0000 ug | ORAL_TABLET | Freq: Every day | ORAL | Status: DC
  Administered 2018-01-19 – 2018-01-30 (×12): 100 ug via ORAL
  Filled 2018-01-18 (×12): qty 1

## 2018-01-18 MED ORDER — PANTOPRAZOLE SODIUM 40 MG PO TBEC
40.0000 mg | DELAYED_RELEASE_TABLET | Freq: Every day | ORAL | Status: DC
  Administered 2018-01-19 – 2018-01-30 (×12): 40 mg via ORAL
  Filled 2018-01-18 (×12): qty 1

## 2018-01-18 MED ORDER — SODIUM CHLORIDE 0.9 % IV SOLN
INTRAVENOUS | Status: AC
  Administered 2018-01-19: via INTRAVENOUS

## 2018-01-18 MED ORDER — VITAMIN D 1000 UNITS PO TABS
1000.0000 [IU] | ORAL_TABLET | Freq: Every day | ORAL | Status: DC
  Administered 2018-01-19 – 2018-01-30 (×12): 1000 [IU] via ORAL
  Filled 2018-01-18 (×12): qty 1

## 2018-01-18 MED ORDER — IOPAMIDOL (ISOVUE-300) INJECTION 61%
100.0000 mL | Freq: Once | INTRAVENOUS | Status: AC | PRN
Start: 2018-01-18 — End: 2018-01-18
  Administered 2018-01-18: 100 mL via INTRAVENOUS

## 2018-01-18 MED ORDER — HYDRALAZINE HCL 50 MG PO TABS
100.0000 mg | ORAL_TABLET | Freq: Three times a day (TID) | ORAL | Status: DC
  Administered 2018-01-18 – 2018-01-30 (×33): 100 mg via ORAL
  Filled 2018-01-18 (×34): qty 2

## 2018-01-18 MED ORDER — ALPRAZOLAM 0.25 MG PO TABS
0.2500 mg | ORAL_TABLET | Freq: Two times a day (BID) | ORAL | Status: DC | PRN
  Administered 2018-01-19 – 2018-01-22 (×4): 0.25 mg via ORAL
  Administered 2018-01-22: 0.5 mg via ORAL
  Administered 2018-01-23: 0.25 mg via ORAL
  Administered 2018-01-24 – 2018-01-27 (×5): 0.5 mg via ORAL
  Administered 2018-01-28: 0.25 mg via ORAL
  Administered 2018-01-29: 0.5 mg via ORAL
  Filled 2018-01-18: qty 1
  Filled 2018-01-18 (×2): qty 2
  Filled 2018-01-18: qty 1
  Filled 2018-01-18: qty 2
  Filled 2018-01-18: qty 1
  Filled 2018-01-18: qty 2
  Filled 2018-01-18: qty 1
  Filled 2018-01-18 (×3): qty 2
  Filled 2018-01-18: qty 1
  Filled 2018-01-18: qty 2

## 2018-01-18 MED ORDER — ATORVASTATIN CALCIUM 80 MG PO TABS
80.0000 mg | ORAL_TABLET | Freq: Every day | ORAL | Status: DC
  Administered 2018-01-18 – 2018-01-30 (×13): 80 mg via ORAL
  Filled 2018-01-18 (×14): qty 1

## 2018-01-18 NOTE — Progress Notes (Signed)
ANTICOAGULATION CONSULT NOTE - Initial Consult  Pharmacy Consult for warfarin Indication: Mechanical valve   Allergies  Allergen Reactions  . Losartan Swelling  . Verapamil Hives  . Zetia [Ezetimibe] Other (See Comments)    Reaction=unknown  . Zocor [Simvastatin - High Dose]     Patient Measurements:   Heparin Dosing Weight: n/a   Vital Signs: Temp: 98.1 F (36.7 C) (04/12 1750) Temp Source: Oral (04/12 1750) BP: 157/40 (04/12 1800) Pulse Rate: 88 (04/12 1800)  Labs: Recent Labs    01/18/18 1049  HGB 7.1*  7.1*  HCT 23.2*  23.2*  PLT 183  241  LABPROT 26.3*  INR 2.44  CREATININE 0.78    CrCl cannot be calculated (Unknown ideal weight.).   Medical History: Past Medical History:  Diagnosis Date  . Carotid artery disease ([place])   . Chronic anticoagulation   . Coronary artery disease    status post coronary artery bypass grafting times 07/10/2004  . Diabetes mellitus   . Hypercholesteremia   . Hypertension   . Mechanical heart valve present    H. aortic valve replacement at the time of bypass surgery October 2005  . Peripheral arterial disease ([facility])    history of left common iliac artery PTA and stenting for a chronic total occlusion 08/26/01    Medications:   (Not in a hospital admission)  Assessment: 9 YOF with h/o of [employer] valve AVR on warfarin at home who presents with drop in Hgb. No overt s/s of bleeding noted. Transfused 1 u PRBCs. Pharmacy consulted to resume home warfarin. INR on admission is slightly sub-therapeutic at 2.44. H/H 7.1/23.2. Plt wnl. Per patient, she took her dose of warfarin today   Home warfarin dose: 7.5 mg on MWF/ 5 mg on all other days   Goal of Therapy:  INR 2.5-3.5 Monitor platelets by anticoagulation protocol: Yes   Plan:  -Hold warfarin today -Resume home warfarin therapy tomorrow -Monitor CBC, daily INR and s/s of bleeding   Albertina Parr, PharmD., BCPS Clinical Pharmacist Clinical phone for 01/18/18  until 11pm: [id number] If after 11pm, please call main pharmacy at: [phone]

## 2018-01-18 NOTE — ED Provider Notes (Signed)
Received signout from PA Domenic Moras at shift change.  Please read his note for further details.  Plan to admit this patient for symptomatic anemia following return of CT scan. Waiting to determine if pancreatic mass requiring surgery consult.   CT scan without evidence of mass on the pancreatic head.  Spoke with Hospitalist Dr. Alinda Dooms who will accept patient.    Glyn Ade, PA-C 01/19/18 7225    Merrily Pew, MD 01/25/18 1750

## 2018-01-18 NOTE — ED Notes (Signed)
Admitting at bedside 

## 2018-01-18 NOTE — ED Triage Notes (Signed)
Pt sent in by PCP for lowe hgb, called this morning and told lab work from yesterday was 7.4, sent for transfusion, pt pale on arrival, reports fatigue and shortness of breath with exertion, no distress in triage

## 2018-01-18 NOTE — Progress Notes (Signed)
[name] is a 77 y.o. female patient admitted from ED awake, alert - oriented  X 4 - no acute distress noted.  VSS - Blood pressure (!) 177/71, pulse 75, temperature 98.2 F (36.8 C), temperature source Oral, resp. rate 20, SpO2 98 %.    IV in place, occlusive dsg intact without redness.  Orientation to room, and floor completed with information packet given to patient/family.  Patient declined safety video at this time.  Admission INP armband ID verified with patient/family, and in place.   SR up x 2, fall assessment complete, with patient and family able to verbalize understanding of risk associated with falls, and verbalized understanding to call nsg before up out of bed.  Call light within reach, patient able to voice, and demonstrate understanding.  Skin, clean-dry- intact without evidence of bruising, or skin tears.   No evidence of skin break down noted on exam.     Will cont to eval and treat per MD orders.  Holley Raring, RN 01/18/2018 7:00 PM

## 2018-01-18 NOTE — ED Provider Notes (Signed)
Medical screening examination/treatment/procedure(s) were conducted as a shared visit with non-physician practitioner(s) and myself.  I personally evaluated the patient during the encounter.  77 year old female here with what seems asymptomatic anemia.  She is a bit tachypneic and pale looking on my examination.  She is found to have a hemoglobin of 7.4 at her primary care office.  We will repeat that here as well.  Also has a decreased appetite, abdominal distention, weight loss that was unintentional over the last few months so we will get a CT scan to evaluate for any malignancy that could be related.  Otherwise he is to be admitted for blood transfusions and further workup.  EKG Interpretation  Date/Time:  Friday January 18 2018 10:50:47 EDT Ventricular Rate:  71 PR Interval:    QRS Duration: 90 QT Interval:  428 QTC Calculation: 465 R Axis:   113 Text Interpretation:  Atrial fibrillation RSR' or QR pattern in V1 suggests right ventricular conduction delay Left posterior fascicular block Abnormal ECG Confirmed by Merrily Pew [id number]) on 01/18/2018 11:26:19 AM     Bethanne Mule, Corene Cornea, MD 01/18/18 [phone]

## 2018-01-18 NOTE — ED Notes (Signed)
Patient transported to CT 

## 2018-01-18 NOTE — H&P (Signed)
77 year old female known history of CABG X2 (pharmacological stress test 2014 falls positive) to [place] AVR 10/5/5--[last echo showed valve gradient slightly elevated compared to prior], HTN, HLD, DM, prior tobacco abuse, PVD with left common iliac stenosis + valvular disease, onychogryphosis foll by DR.   Sent by Dr. Rosalie Gums from office-started seeing him about 1 week ago after referral from Dr. 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 Has never had a colonoscopy  Presents with asymptomatic GI bleed, hemoglobin of 7 down from about 10 2 years prior, ED workup showed CT scan without findings of lesion however gallbladder collapsed around multiple gallstones suggestive of chronic cholecystitis extensive diverticulosis without colitis, basic metabolic panel normal  Review of systems, no fever, no chills, no cough, cold, no dysuria, no headache, no blurred vision, no double vision, no rash, no nausea no vomiting-had to semi-formed stools today which were not dark or tarry No unilateral weakness no visual disturbance  No known drug allergies chronic smoker up until 2005 when had CABG never EtOH  Mother passed away from some form of cancer Father unknown, daughter had ALS   BP (!) 160/39   Pulse 69   Temp 98.2 F (36.8 C) (Oral)   Resp 19   SpO2 94%  Alert pleasant oriented no distress EOMI NCAT no pallor Mallampati 2 no JVD no bruit S1-S2 no murmur rub or gallop chest is clinically clear without added sound abdomen is slightly distended with mild tenderness right upper quadrant but no epigastric tenderness no CVA tenderness grade 2 lower extremity edema no rash Rectal exam was  done by ED physician but did not note hemorrhoids-on assessing the area patient has a 12:00 6:00 and 3:00 external thrombosed hemorrhoid   Essentially 77 year old lady who comes in with unexplained drop in hemoglobin-she has never had a colonoscopy.  I have asked emergency room provider to page on-call [facility] MD We will admit her to hobs and transfuse 1 unit of packed red blood cells I will keep her clear liquids in case gastroenterology wants to see the patient and do a colonoscopy as she has never had an evaluation for the same in the past-I believe she has high functioning enough and is a full code that this would warrant investigation  CABG 2005, [place] AVR with worsening AV gradient-outpatient follow-up with cardiologist-last echo was 2018-she is on aspirin 81 mg which we will continue in addition I would continue Coumadin at this time given her AVR which is a mechanical 1-if there is a procedure plan we can easily switch over to heparin and hold Coumadin and reverse the same for procedure-if not she would then be therapeutic enough to go home and have this done as an outpatient Continue Coreg 25 twice daily  Diastolic heart failure, no acute component has had chronic swelling for the past year-continue 60 of Lasix daily, continue hydralazine 100 3 times daily,  HTN-continue Avapro 300 daily  Reflux continue pantoprazole 40 daily  B12 deficiency continue B12 100 mcg daily  IVs mellitus type II-holding Glucotrol XL place on sliding scale sensitive coverage

## 2018-01-18 NOTE — ED Provider Notes (Signed)
[facility] EMERGENCY DEPARTMENT Provider Note   CSN: [id number] Arrival date & time: 01/18/18  1033     History   Chief Complaint Chief Complaint  Patient presents with  . Abnormal Lab    HPI [name] is a 77 y.o. female.  HPI   77 year old female with history of CAD, diabetes, hypertension, mechanical heart valve, peripheral artery disease, currently on warfarin who was sent here per recommendation of her GI specialist due to low blood count.  Patient report for the past 1-2 months she has been experiencing increased generalized weakness and fatigue.  Within the past 3 weeks she developed shortness of breath even with short distance.  She also have vague abdominal discomfort.  She has been seen and evaluated by her cardiologist as well as has her doctor for her abdominal discomfort.  She was seen and evaluated by GI specialist last week and had an ultrasound of her abdomen yesterday as well as blood work.  She was notified today that her hemoglobin is 7.4 and she would need to come to the hospital for blood transfusion.  She denies noticing any abnormal bleeding.  Does endorse occasional nausea and decrease in appetite.  She denies having fever, chills, productive cough, hemoptysis, dysuria, black tarry stool.  No active chest pain.  Family member did notice that her skin is more pale than usual.  She did recall remote history of anemia from heart surgery requiring blood transfusion. She cannot report last colonoscopy or endoscopy.  Past Medical History:  Diagnosis Date  . Carotid artery disease ([place])   . Chronic anticoagulation   . Coronary artery disease    status post coronary artery bypass grafting times 07/10/2004  . Diabetes mellitus   . Hypercholesteremia   . Hypertension   . Mechanical heart valve present    H. aortic valve replacement at the time of bypass surgery October 2005  . Peripheral arterial disease ([facility])    history of left common iliac  artery PTA and stenting for a chronic total occlusion 08/26/01    Patient Active Problem List   Diagnosis Date Noted  . Atrial fibrillation ([place]) 01/12/2016  . Coronary artery disease 08/18/2013  . Carotid artery disease ([place]) 08/18/2013  . Peripheral arterial disease ([place]) 08/18/2013  . Type 2 diabetes mellitus ([place]) 08/18/2013  . Benign positional vertigo 10/06/2011  . Hyponatremia 10/06/2011  . Influenza 10/06/2011  . Sinusitis 10/06/2011  . Hypertension   . Hypercholesteremia   . Mechanical heart valve present   . Chronic anticoagulation     Past Surgical History:  Procedure Laterality Date  . AORTIC VALVE REPLACEMENT    . CARDIAC CATHETERIZATION  11/10/2004   40% right common illiac, 70% in stent restenosis of distal left common illiac,   . CARDIAC CATHETERIZATION  05/18/2004   LAD 50-70% midstenosis, RCA dominant w/50% stenosis, 50% Right common Illiac artery ostial stenosis, 90% in stent restenosis within midportion of left common illiac stent  . Carotid Duplex  03/12/2012   RSA-elev. velocities suggestive of a 50-69% diameter reduction, Right&Left Bulb/Prox ICA-mild-mod.fibrous plaqueelevating Velocities abnormal study.  . Lower Ext. Duplex  03/12/2012   Right Proximal CIA- vessel narrowing w/elevated velocities 0-49% diameter reduction. Right SFA-mild mixed density plaque throughout vessel.  [name] NM MYOCAR PERF WALL MOTION  05/19/2010   protocol: Persantine, post stress EF 65%, negative for ischemia, low risk scan  . TRANSTHORACIC ECHOCARDIOGRAM  08/29/2012   Moderately calcified annulus of mitral valve, moderate regurg. of both mitral  valve and tricuspid valve.      OB History   None      Home Medications    Prior to Admission medications   Medication Sig Start Date End Date Taking? Authorizing Provider  ALPRAZolam Duanne Moron) 0.5 MG tablet Take 0.25-0.5 mg by mouth 2 (two) times daily as needed for anxiety. For anxiety    [provider]  aspirin EC 81 MG  tablet Take 81 mg by mouth daily. Patient only takes on Mon Wed and Friday    [provider]  atorvastatin (LIPITOR) 80 MG tablet Take 1 tablet (80 mg total) by mouth daily. 09/25/17   Lorretta Harp, MD  carvedilol (COREG) 25 MG tablet TAKE ONE TABLET BY MOUTH TWICE A DAY WITH MEALS 11/22/17   Lorretta Harp, MD  Cholecalciferol (VITAMIN D3) 1000 UNITS CAPS Take 1 capsule by mouth daily.    [provider]  furosemide (LASIX) 40 MG tablet Take 60 mg by mouth daily. Takes 1 and 1/2 tablets of the 40mg  daily    [provider]  glipiZIDE (GLUCOTROL XL) 2.5 MG 24 hr tablet Take 1 tablet by mouth daily. 04/04/17   [provider]  hydrALAZINE (APRESOLINE) 100 MG tablet Take 1 tablet (100 mg total) by mouth 3 (three) times daily. 11/07/17   Lorretta Harp, MD  irbesartan (AVAPRO) 300 MG tablet Take 1 tablet (300 mg total) by mouth daily. 09/25/17   Lorretta Harp, MD  metFORMIN (GLUCOPHAGE) 500 MG tablet Take 500 mg by mouth 2 (two) times daily with a meal.     [provider]  pantoprazole (PROTONIX) 40 MG tablet Take 40 mg by mouth daily.  12/05/14   [provider]  traMADol (ULTRAM) 50 MG tablet Take 50 mg by mouth every 6 (six) hours as needed for moderate pain or severe pain.     [provider]  vitamin B-12 (CYANOCOBALAMIN) 100 MCG tablet Take 100 mcg by mouth daily.     [provider]  warfarin (COUMADIN) 5 MG tablet TAKE 1 TO 1.5 (ONE TO ONE & ONE-HALF) TABLETS BY MOUTH DAILY AS DIRECTED BY COUMADIN CLINIC 09/10/17   Lorretta Harp, MD    Family History Family History  Problem Relation Age of Onset  . Breast cancer Neg Hx     Social History Social History   Tobacco Use  . Smoking status: Former Smoker    Packs/day: 1.00    Years: 30.00    Pack years: 30.00    Types: Cigarettes  . Smokeless tobacco: Never Used  Substance Use Topics  . Alcohol use: No    Alcohol/week: 0.0 oz  . Drug use: No      Allergies   Losartan; Verapamil; Zetia [ezetimibe]; and Zocor [simvastatin - high dose]   Review of Systems Review of Systems  All other systems reviewed and are negative.    Physical Exam Updated Vital Signs BP (!) 154/58 (BP Location: Right Arm)   Pulse 75   Temp 97.8 F (36.6 C) (Oral)   Resp 19   SpO2 98%   Physical Exam  Constitutional: She is oriented to person, place, and time. She appears well-developed and well-nourished. No distress.  Elderly female pale in appearance but in no acute discomfort.  HENT:  Head: Atraumatic.  Eyes: Conjunctivae are normal.  Neck: Normal range of motion. Neck supple.  Cardiovascular:  Irregularly irregular heart rhythm with systolic murmur  Pulmonary/Chest: Effort normal and breath sounds normal.  Abdominal:  Soft. Bowel sounds are normal. She exhibits no distension. There is no tenderness.  Genitourinary:  Genitourinary Comments: Chaperone present during exam.  Normal rectal tone, no impacted stool in rectum, normal color stool on glove, no obvious mass.  Neurological: She is alert and oriented to person, place, and time.  Skin: No rash noted. There is pallor.  Psychiatric: She has a normal mood and affect.  Nursing note and vitals reviewed.    ED Treatments / Results  Labs (all labs ordered are listed, but only abnormal results are displayed) Labs Reviewed  BASIC METABOLIC PANEL - Abnormal; Notable for the following components:      Result Value   Sodium 133 (*)    CO2 18 (*)    Glucose, Bld 194 (*)    Calcium 8.6 (*)    All other components within normal limits  CBC - Abnormal; Notable for the following components:   RBC 3.02 (*)    Hemoglobin 7.1 (*)    HCT 23.2 (*)    MCV 76.8 (*)    MCH 23.5 (*)    RDW 16.2 (*)    All other components within normal limits  URINALYSIS, ROUTINE W REFLEX MICROSCOPIC - Abnormal; Notable for the following components:   Protein, ur 100 (*)    Leukocytes, UA TRACE (*)     Bacteria, UA RARE (*)    All other components within normal limits  CBC WITH DIFFERENTIAL/PLATELET - Abnormal; Notable for the following components:   WBC 3.8 (*)    RBC 3.04 (*)    Hemoglobin 7.1 (*)    HCT 23.2 (*)    MCV 76.3 (*)    MCH 23.4 (*)    RDW 16.6 (*)    All other components within normal limits  PROTIME-INR - Abnormal; Notable for the following components:   Prothrombin Time 26.3 (*)    All other components within normal limits  POC OCCULT BLOOD, ED - Abnormal; Notable for the following components:   Fecal Occult Bld POSITIVE (*)    All other components within normal limits  TYPE AND SCREEN  PREPARE RBC (CROSSMATCH)  ABO/RH    EKG EKG Interpretation  Date/Time:  Friday January 18 2018 10:50:47 EDT Ventricular Rate:  71 PR Interval:    QRS Duration: 90 QT Interval:  428 QTC Calculation: 465 R Axis:   113 Text Interpretation:  Atrial fibrillation RSR' or QR pattern in V1 suggests right ventricular conduction delay Left posterior fascicular block Abnormal ECG Confirmed by Merrily Pew [phone]) on 01/18/2018 11:26:19 AM   Radiology US Abdomen Complete  Result Date: 01/17/2018 CLINICAL DATA:  Right upper quadrant pain, nausea for 3 months EXAM: ABDOMEN ULTRASOUND COMPLETE COMPARISON:  Ultrasound of the abdomen of 03/30/2014 FINDINGS: Gallbladder: The gallbladder appears to be filled with gallstones and contracted. The largest gallstone measures approximately 2.1 cm. There is no pain over the gallbladder with compression currently, and the gallbladder wall is not thickened. Common bile duct: Diameter: The common bile duct is normal in caliber measuring 4.5 mm in diameter. Liver: The parenchyma of the liver is normal in echogenicity. No focal hepatic abnormality is seen. Portal vein is patent on color Doppler imaging with normal direction of blood flow towards the liver. IVC: No abnormality visualized. Pancreas: There is soft tissue prominence in the region of the head of  the pancreas extending to the uncinate process. The distal pancreatic duct does not appear to be dilated, but either CT using pancreatic protocol or MRI would be  recommended to assess further. The tail of the pancreas is obscured. Spleen: The spleen measures 15.0 cm with scattered calcifications. The volume is 546 cubic cm consistent with moderate splenomegaly. Right Kidney: Length: 11.4 cm.  No hydronephrosis is seen. Left Kidney: Length: 11.7 cm.  No hydronephrosis is noted. Abdominal aorta: The abdominal aorta is normal in caliber. Other findings: There does appear to be a left pleural effusion present. IMPRESSION: 1. Prominent soft tissue in the head of the pancreas extending toward the uncinate process. Recommend either CT using pancreatic protocol or MRI to assess for a pancreatic lesion. The pancreatic duct does not appear to be dilated. 2. Multiple gallstones fill the gallbladder with strong acoustical shadowing. No present evidence of acute cholecystitis is seen. 3. Moderate splenomegaly. 4. Left pleural effusion. Electronically Signed   By: Ivar Drape M.D.   On: 01/17/2018 10:02    Procedures Procedures (including critical care time)  Medications Ordered in ED Medications  iopamidol (ISOVUE-300) 61 % injection (has no administration in time range)  0.9 %  sodium chloride infusion ( Intravenous New Bag/Given 01/18/18 1503)  iopamidol (ISOVUE-300) 61 % injection 100 mL (100 mLs Intravenous Contrast Given 01/18/18 1423)     Initial Impression / Assessment and Plan / ED Course  I have reviewed the triage vital signs and the nursing notes.  Pertinent labs & imaging results that were available during my care of the patient were reviewed by me and considered in my medical decision making (see chart for details).     BP (!) 154/58 (BP Location: Right Arm)   Pulse 75   Temp 97.8 F (36.6 C) (Oral)   Resp 19   SpO2 98%    Final Clinical Impressions(s) / ED Diagnoses   Final diagnoses:   Symptomatic anemia    ED Discharge Orders    None     11:48 AM noted for Patient sent here at the recommendation of her GI specialist, Dr. Luan Pulling for symptomatic anemia requiring blood transfusion.  Her most recent documented hemoglobin is 7.4 per nursing note.  Patient does appear pale, does endorse generalized fatigue and shortness of breath with exertion.  Her blood pressure is stable, and her current hemoglobin is 7.1. Care discussed with Dr. Dayna Barker.   12:29 PM Patient is Hemoccult positive, however normal color stool and no frank bleeding.  She is currently on Coumadin, will check INR.  Abdominal ultrasound from yesterday noted for prominent soft tissue in the head of the pancreas extending towards the uncinate process.  Radiologist recommend either CT using pancreatic protocol or MRI to assess for a pancreatic lesion.  Multiple gallstones filled gallbladder with strong acoustical shadowing.  Given this finding, I will obtain an abdominal and pelvis CT scan using pancreatic protocol for further management.  3:58 PM In the setting of progressive weakness, and symptomatic anemia she will need to be admitted.  Due to persistent bloatness, nightsweats, positive fecal occult blood test, abd/pelvis CT ordered.  Pt sign out to oncoming provider who will f/u on CT result.  If no surgical emergency, pt should be admit to medicine for further care.    CRITICAL CARE Performed by: Domenic Moras Total critical care time: 35 minutes Critical care time was exclusive of separately billable procedures and treating other patients. Critical care was necessary to treat or prevent imminent or life-threatening deterioration. Critical care was time spent personally by me on the following activities: development of treatment plan with patient and/or surrogate as well as nursing, discussions  with consultants, evaluation of patient's response to treatment, examination of patient, obtaining history from patient or  surrogate, ordering and performing treatments and interventions, ordering and review of laboratory studies, ordering and review of radiographic studies, pulse oximetry and re-evaluation of patient's condition.    Domenic Moras, PA-C 01/18/18 1601    Mesner, Corene Cornea, MD 01/18/18 1644    Mesner, Corene Cornea, MD 01/18/18 [phone]

## 2018-01-18 NOTE — ED Notes (Signed)
Pt back from CT

## 2018-01-18 NOTE — ED Notes (Signed)
purewik placed per pt request

## 2018-01-19 DIAGNOSIS — Z87891 Personal history of nicotine dependence: Secondary | ICD-10-CM | POA: Diagnosis not present

## 2018-01-19 DIAGNOSIS — Y92009 Unspecified place in unspecified non-institutional (private) residence as the place of occurrence of the external cause: Secondary | ICD-10-CM | POA: Diagnosis not present

## 2018-01-19 DIAGNOSIS — R1010 Upper abdominal pain, unspecified: Secondary | ICD-10-CM | POA: Diagnosis not present

## 2018-01-19 DIAGNOSIS — K2901 Acute gastritis with bleeding: Secondary | ICD-10-CM | POA: Diagnosis not present

## 2018-01-19 DIAGNOSIS — Z888 Allergy status to other drugs, medicaments and biological substances status: Secondary | ICD-10-CM | POA: Diagnosis not present

## 2018-01-19 DIAGNOSIS — D696 Thrombocytopenia, unspecified: Secondary | ICD-10-CM | POA: Diagnosis present

## 2018-01-19 DIAGNOSIS — I482 Chronic atrial fibrillation: Secondary | ICD-10-CM | POA: Diagnosis present

## 2018-01-19 DIAGNOSIS — K8010 Calculus of gallbladder with chronic cholecystitis without obstruction: Secondary | ICD-10-CM | POA: Diagnosis not present

## 2018-01-19 DIAGNOSIS — Z7982 Long term (current) use of aspirin: Secondary | ICD-10-CM | POA: Diagnosis not present

## 2018-01-19 DIAGNOSIS — Z951 Presence of aortocoronary bypass graft: Secondary | ICD-10-CM | POA: Diagnosis not present

## 2018-01-19 DIAGNOSIS — K5730 Diverticulosis of large intestine without perforation or abscess without bleeding: Secondary | ICD-10-CM | POA: Diagnosis present

## 2018-01-19 DIAGNOSIS — Z952 Presence of prosthetic heart valve: Secondary | ICD-10-CM | POA: Diagnosis not present

## 2018-01-19 DIAGNOSIS — E1151 Type 2 diabetes mellitus with diabetic peripheral angiopathy without gangrene: Secondary | ICD-10-CM | POA: Diagnosis present

## 2018-01-19 DIAGNOSIS — E538 Deficiency of other specified B group vitamins: Secondary | ICD-10-CM | POA: Diagnosis present

## 2018-01-19 DIAGNOSIS — I5032 Chronic diastolic (congestive) heart failure: Secondary | ICD-10-CM | POA: Diagnosis present

## 2018-01-19 DIAGNOSIS — Z7901 Long term (current) use of anticoagulants: Secondary | ICD-10-CM | POA: Diagnosis not present

## 2018-01-19 DIAGNOSIS — R21 Rash and other nonspecific skin eruption: Secondary | ICD-10-CM | POA: Diagnosis present

## 2018-01-19 DIAGNOSIS — T45515A Adverse effect of anticoagulants, initial encounter: Secondary | ICD-10-CM | POA: Diagnosis present

## 2018-01-19 DIAGNOSIS — K640 First degree hemorrhoids: Secondary | ICD-10-CM | POA: Diagnosis present

## 2018-01-19 DIAGNOSIS — K8020 Calculus of gallbladder without cholecystitis without obstruction: Secondary | ICD-10-CM | POA: Diagnosis present

## 2018-01-19 DIAGNOSIS — E871 Hypo-osmolality and hyponatremia: Secondary | ICD-10-CM | POA: Diagnosis present

## 2018-01-19 DIAGNOSIS — D649 Anemia, unspecified: Secondary | ICD-10-CM | POA: Diagnosis present

## 2018-01-19 DIAGNOSIS — I700 Atherosclerosis of aorta: Secondary | ICD-10-CM | POA: Diagnosis present

## 2018-01-19 DIAGNOSIS — E785 Hyperlipidemia, unspecified: Secondary | ICD-10-CM | POA: Diagnosis present

## 2018-01-19 DIAGNOSIS — M79603 Pain in arm, unspecified: Secondary | ICD-10-CM | POA: Diagnosis not present

## 2018-01-19 DIAGNOSIS — L538 Other specified erythematous conditions: Secondary | ICD-10-CM | POA: Diagnosis present

## 2018-01-19 DIAGNOSIS — K922 Gastrointestinal hemorrhage, unspecified: Secondary | ICD-10-CM | POA: Diagnosis present

## 2018-01-19 DIAGNOSIS — R042 Hemoptysis: Secondary | ICD-10-CM | POA: Diagnosis not present

## 2018-01-19 DIAGNOSIS — I110 Hypertensive heart disease with heart failure: Secondary | ICD-10-CM | POA: Diagnosis present

## 2018-01-19 DIAGNOSIS — K219 Gastro-esophageal reflux disease without esophagitis: Secondary | ICD-10-CM | POA: Diagnosis present

## 2018-01-19 LAB — COMPREHENSIVE METABOLIC PANEL
ALT: 19 U/L (ref 14–54)
AST: 33 U/L (ref 15–41)
Albumin: 2.6 g/dL — ABNORMAL LOW (ref 3.5–5.0)
Alkaline Phosphatase: 66 U/L (ref 38–126)
Anion gap: 10 (ref 5–15)
BILIRUBIN TOTAL: 1.4 mg/dL — AB (ref 0.3–1.2)
BUN: 10 mg/dL (ref 6–20)
CO2: 18 mmol/L — ABNORMAL LOW (ref 22–32)
CREATININE: 0.59 mg/dL (ref 0.44–1.00)
Calcium: 8.2 mg/dL — ABNORMAL LOW (ref 8.9–10.3)
Chloride: 104 mmol/L (ref 101–111)
GFR calc Af Amer: >60 mL/min (ref >60)
Glucose, Bld: 133 mg/dL — ABNORMAL HIGH (ref 65–99)
Potassium: 3.9 mmol/L (ref 3.5–5.1)
Sodium: 132 mmol/L — ABNORMAL LOW (ref 135–145)
TOTAL PROTEIN: 5.7 g/dL — AB (ref 6.5–8.1)

## 2018-01-19 LAB — CBC
HEMATOCRIT: 24.6 % — AB (ref 36.0–46.0)
Hemoglobin: 7.6 g/dL — ABNORMAL LOW (ref 12.0–15.0)
MCH: 23.6 pg — ABNORMAL LOW (ref 26.0–34.0)
MCHC: 30.9 g/dL (ref 30.0–36.0)
MCV: 76.4 fL — AB (ref 78.0–100.0)
Platelets: 213 10*3/uL (ref 150–400)
RBC: 3.22 MIL/uL — ABNORMAL LOW (ref 3.87–5.11)
RDW: 15.9 % — AB (ref 11.5–15.5)
WBC: 3.7 10*3/uL — AB (ref 4.0–10.5)

## 2018-01-19 LAB — PREPARE RBC (CROSSMATCH)

## 2018-01-19 LAB — GLUCOSE, CAPILLARY
Glucose-Capillary: 154 mg/dL — ABNORMAL HIGH (ref 65–99)
Glucose-Capillary: 177 mg/dL — ABNORMAL HIGH (ref 65–99)

## 2018-01-19 LAB — PROTIME-INR
INR: 2.72
PROTHROMBIN TIME: 28.7 s — AB (ref 11.4–15.2)

## 2018-01-19 MED ORDER — FUROSEMIDE 10 MG/ML IJ SOLN
20.0000 mg | Freq: Once | INTRAMUSCULAR | Status: AC
  Administered 2018-01-19: 20 mg via INTRAVENOUS
  Filled 2018-01-19: qty 2

## 2018-01-19 MED ORDER — INSULIN ASPART 100 UNIT/ML ~~LOC~~ SOLN
0.0000 [IU] | Freq: Every day | SUBCUTANEOUS | Status: DC
  Administered 2018-01-20 – 2018-01-25 (×3): 2 [IU] via SUBCUTANEOUS
  Administered 2018-01-28: 3 [IU] via SUBCUTANEOUS
  Administered 2018-01-29: 2 [IU] via SUBCUTANEOUS

## 2018-01-19 MED ORDER — INSULIN ASPART 100 UNIT/ML ~~LOC~~ SOLN
0.0000 [IU] | Freq: Three times a day (TID) | SUBCUTANEOUS | Status: DC
  Administered 2018-01-20: 1 [IU] via SUBCUTANEOUS
  Administered 2018-01-20 – 2018-01-21 (×2): 3 [IU] via SUBCUTANEOUS
  Administered 2018-01-21: 1 [IU] via SUBCUTANEOUS
  Administered 2018-01-22 – 2018-01-23 (×2): 3 [IU] via SUBCUTANEOUS
  Administered 2018-01-23: 2 [IU] via SUBCUTANEOUS
  Administered 2018-01-23: 1 [IU] via SUBCUTANEOUS
  Administered 2018-01-24 – 2018-01-25 (×5): 2 [IU] via SUBCUTANEOUS
  Administered 2018-01-26 (×2): 1 [IU] via SUBCUTANEOUS
  Administered 2018-01-26 – 2018-01-27 (×4): 2 [IU] via SUBCUTANEOUS
  Administered 2018-01-28: 1 [IU] via SUBCUTANEOUS
  Administered 2018-01-28: 3 [IU] via SUBCUTANEOUS
  Administered 2018-01-28 – 2018-01-29 (×2): 1 [IU] via SUBCUTANEOUS
  Administered 2018-01-29: 2 [IU] via SUBCUTANEOUS
  Administered 2018-01-29 – 2018-01-30 (×2): 1 [IU] via SUBCUTANEOUS
  Administered 2018-01-30: 2 [IU] via SUBCUTANEOUS

## 2018-01-19 MED ORDER — SODIUM CHLORIDE 0.9 % IV SOLN
Freq: Once | INTRAVENOUS | Status: AC
  Administered 2018-01-19: 09:00:00 via INTRAVENOUS

## 2018-01-19 MED ORDER — MAGNESIUM OXIDE 400 (241.3 MG) MG PO TABS
ORAL_TABLET | Freq: Every day | ORAL | Status: DC
  Administered 2018-01-19 – 2018-01-29 (×11): 400 mg via ORAL
  Administered 2018-01-30: 08:00:00 via ORAL
  Filled 2018-01-19 (×12): qty 1

## 2018-01-19 MED ORDER — METOPROLOL TARTRATE 50 MG PO TABS
50.0000 mg | ORAL_TABLET | Freq: Two times a day (BID) | ORAL | Status: DC
  Administered 2018-01-19 – 2018-01-30 (×22): 50 mg via ORAL
  Filled 2018-01-19 (×22): qty 1

## 2018-01-19 MED ORDER — WARFARIN SODIUM 5 MG PO TABS
5.0000 mg | ORAL_TABLET | Freq: Once | ORAL | Status: AC
  Administered 2018-01-19: 5 mg via ORAL
  Filled 2018-01-19: qty 1

## 2018-01-19 MED ORDER — WARFARIN - PHARMACIST DOSING INPATIENT: Freq: Every day | Status: DC

## 2018-01-19 MED ORDER — DIPHENHYDRAMINE HCL 25 MG PO CAPS
25.0000 mg | ORAL_CAPSULE | Freq: Once | ORAL | Status: AC
  Administered 2018-01-19: 25 mg via ORAL
  Filled 2018-01-19: qty 1

## 2018-01-19 NOTE — Progress Notes (Signed)
Hospitalist progress note   JASE HIMMELBERGER  HYW:[id number] DOB: 1940/11/17 DOA: 01/18/2018 PCP: [name], MD Specialists:    Brief Narrative:  77 year old female known history of CABG X2 (pharmacological stress test 2014 falls positive) to [place] AVR 10/5/5--[last echo showed valve gradient slightly elevated compared to prior], HTN, HLD, DM, prior tobacco abuse, PVD with left common iliac stenosis + valvular disease, onychogryphosis foll by DR.   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  But is on Coumadin with need for [facility] to INR 2.5-->3.5    Assessment & Plan:   GI bleed- unexplained drop in hemoglobin-she has never had a colonoscopy.  +hemorrhoids x 3 transfuse 1 unit of packed red blood cells Rpt transfusion 1 u prbc 4/13 GI evalauted--will scope when INR ~1.8 Now on heparin GTT  CABG 2005, [employer] [facility] AVR with worsening AV gradient-outpatient follow-up with cardiologist-last echo was 2018-she is on aspirin 81 mg in addition to Coumadin Can keep peri-procedural aspirin  Diastolic heart failure, no acute component has had chronic swelling for the past year-continue 60 of Lasix daily, continue hydralazine 100 3 times daily,  HTN-continue Avapro 300 daily not controlled blood pressure is 160s, metoprolol 50 mg twice daily-has a reported allergy to Coreg with swelling in the feet-if this recurs with metoprolol will add amlodipine instead  Reflux continue pantoprazole 40 daily  B12 deficiency continue B12 100 mcg daily  IVs mellitus type II-holding Glucotrol XL place on sliding scale sensitive coverage  Now on heparin GTT, inpatient, full code, no family present at this time   Consultants:   Gastroenterology  Procedures:   None currently  Antimicrobials:   None  Subjective: Awake alert pleasant oriented-n.p.o. has been discontinued patient can eat She is not  having any dark or tarry stools or any other issues   Objective: Vitals:   01/19/18 0814 01/19/18 0845 01/19/18 1100 01/19/18 1255  BP: (!) 158/34 (!) 154/43 (!) 160/52 (!) 160/58  Pulse: 100 (!) 107 99 96  Resp: 16 16 16 20   Temp: 99 F (37.2 C) 99 F (37.2 C) 98.6 F (37 C) 98.8 F (37.1 C)  TempSrc: Oral Axillary Oral Oral  SpO2: 94% 98% 96% 94%    Intake/Output Summary (Last 24 hours) at 01/19/2018 1624 Last data filed at 01/19/2018 0536 Gross per 24 hour  Intake 582.5 ml  Output -  Net 582.5 ml   There were no vitals filed for this visit.  Examination: EOMI NCAT no distress S1-S2 no murmur rub or gallop Chest clinically clear no added sound Abdomen soft nontender no rebound no guarding Neurologically intact   Data Reviewed: I have personally reviewed following labs and imaging studies  CBC: Recent Labs  Lab 01/18/18 1049 01/18/18 2052 01/19/18 0618  WBC 3.8*  4.0  --  3.7*  NEUTROABS 2.7  --   --   HGB 7.1*  7.1* 7.9* 7.6*  HCT 23.2*  23.2* 25.5* 24.6*  MCV 76.3*  76.8*  --  76.4*  PLT 183  241  --  485   Basic Metabolic Panel: Recent Labs  Lab 01/18/18 1049 01/19/18 0618  NA 133* 132*  K 4.5 3.9  CL 104 104  CO2 18* 18*  GLUCOSE 194* 133*  BUN 12 10  CREATININE 0.78 0.59  CALCIUM 8.6* 8.2*   GFR: CrCl cannot be calculated (Unknown ideal weight.). Liver Function Tests: Recent Labs  Lab 01/19/18  0618  AST 33  ALT 19  ALKPHOS 66  BILITOT 1.4*  PROT 5.7*  ALBUMIN 2.6*   No results for input(s): LIPASE, AMYLASE in the last 168 hours. No results for input(s): AMMONIA in the last 168 hours. Coagulation Profile: Recent Labs  Lab 01/18/18 1049 01/19/18 0618  INR 2.44 2.72   Cardiac Enzymes:  Radiology Studies: Reviewed images personally in health database   Scheduled Meds: . aspirin EC  81 mg Oral Daily  . atorvastatin  80 mg Oral Daily  . cholecalciferol  1,000 Units Oral Daily  . furosemide  60 mg Oral Daily  .  hydrALAZINE  100 mg Oral Q8H  . irbesartan  300 mg Oral Daily  . pantoprazole  40 mg Oral Daily  . sodium chloride flush  3 mL Intravenous Q12H  . vitamin B-12  100 mcg Oral Daily  . warfarin  5 mg Oral ONCE-1800  . Warfarin - Pharmacist Dosing Inpatient   Does not apply q1800   Continuous Infusions:   LOS: 0 days    Time spent: Otho, MD [facility] ([facility]  If 7PM-7AM, please contact night-coverage [email or web address] Password [facility] 01/19/2018, 4:24 PM

## 2018-01-19 NOTE — Progress Notes (Signed)
ANTICOAGULATION CONSULT NOTE - Follow Up Consult  Pharmacy Consult for heparin and warfarin Indication: mechanical AVR  Allergies  Allergen Reactions  . Coreg [Carvedilol] Other (See Comments)    Terrible cramping in the feet and had a lot of bowel movements, but not diarrhea  . Losartan Swelling    Patient doesn't recall site of swelling  . Spironolactone Nausea Only    Caused extreme nausea  . Verapamil Hives  . Zetia [Ezetimibe] Other (See Comments)    Reaction not recalled  . Zocor [Simvastatin - High Dose] Other (See Comments)    Reaction not recalled    Vital Signs: Temp: 99 F (37.2 C) (04/13 0814) Temp Source: Oral (04/13 0814) BP: 158/34 (04/13 0814) Pulse Rate: 100 (04/13 0814)  Labs: Recent Labs    01/18/18 1049 01/18/18 2052 01/19/18 0618  HGB 7.1*  7.1* 7.9* 7.6*  HCT 23.2*  23.2* 25.5* 24.6*  PLT 183  241  --  213  LABPROT 26.3*  --  28.7*  INR 2.44  --  2.72  CREATININE 0.78  --  0.59    CrCl cannot be calculated (Unknown ideal weight.).   Medications:  Scheduled:  . aspirin EC  81 mg Oral Daily  . atorvastatin  80 mg Oral Daily  . cholecalciferol  1,000 Units Oral Daily  . diphenhydrAMINE  25 mg Oral Once  . furosemide  20 mg Intravenous Once  . furosemide  60 mg Oral Daily  . hydrALAZINE  100 mg Oral Q8H  . irbesartan  300 mg Oral Daily  . pantoprazole  40 mg Oral Daily  . sodium chloride flush  3 mL Intravenous Q12H  . vitamin B-12  100 mcg Oral Daily   Infusions:  . sodium chloride 50 mL/hr at 01/19/18 0015  . sodium chloride      Assessment: 19 YOF with history of [facility] [facility] mechanical AVR on warfarin PTA who presented with drop in hemoglobin to 7.1. INR on admission was slightly subtherapeutic at 2.44. Patient received 1 unit PRBC, HGB improved to 7.9. Last dose of warfarin was 4/12 ~0830. Patient has no overt signs of bleeding.  INR today is therapeutic at 2.72. No indication for heparin at this time.  Home warfarin dose: 7.5  mg on MWF/ 5 mg on all other days   Goal of Therapy:  INR 2.5 - 3.5 Monitor platelets by anticoagulation protocol: Yes   Plan:  Warfarin 5 mg PO x 1 tonight Hold heparin while INR is therapeutic Daily INR, CBC Monitor s/sx of bleeding Follow up colonoscopy plans   Charlene Brooke, PharmD PGY1 Pharmacy Resident Phone: [phone] After 3:30PM please call [place] [phone] 01/19/2018,8:38 AM

## 2018-01-19 NOTE — Consult Note (Addendum)
Referring Provider: Dr. Verlon Au Primary Care Physician:  [name], [name], MD Primary Gastroenterologist:  Dr. Alessandra Bevels  Reason for Consultation:  Anemia (consult called to me this afternoon by Dr. Verlon Au; ER did not contact me for consult yesterday)  HPI: [name] is a 77 y.o. female who has been having several weeks of nausea, shortness of breath, and weakness. Denies associated vomiting, heartburn, dysphagia, melena, or hematochezia but admits she does not look at her stools on a regular basis. Has been having 2-3 loose stools lately. Has never had a colonoscopy or EGD. On Coumadin for AVR. Recent UTI treated with 2 different Abx courses. Hgb 7.4 on 01/17/18 and was 8.0 on 01/09/18. On admit Hgb 7.1 yesterday (transfused one unit PRBCs and Hgb 7.9 post-transfusion) and 7.6 today. INR 2.44 yesterday and 2.72 today. Heme positive. Abd ultrasound (01/17/18) reported soft tissue prominence in pancreatic head. CT (01/18/18) negative for pancreatic mass. Multiple gallstones seen concerning for chronic cholecystitis per radiology. Family at bedside.  Past Medical History:  Diagnosis Date  . Carotid artery disease ([place])   . Chronic anticoagulation   . Coronary artery disease    status post coronary artery bypass grafting times 07/10/2004  . Diabetes mellitus   . Hypercholesteremia   . Hypertension   . Mechanical heart valve present    H. aortic valve replacement at the time of bypass surgery October 2005  . Peripheral arterial disease ([facility])    history of left common iliac artery PTA and stenting for a chronic total occlusion 08/26/01    Past Surgical History:  Procedure Laterality Date  . AORTIC VALVE REPLACEMENT    . CARDIAC CATHETERIZATION  11/10/2004   40% right common illiac, 70% in stent restenosis of distal left common illiac,   . CARDIAC CATHETERIZATION  05/18/2004   LAD 50-70% midstenosis, RCA dominant w/50% stenosis, 50% Right common Illiac artery ostial stenosis, 90% in stent  restenosis within midportion of left common illiac stent  . Carotid Duplex  03/12/2012   RSA-elev. velocities suggestive of a 50-69% diameter reduction, Right&Left Bulb/Prox ICA-mild-mod.fibrous plaqueelevating Velocities abnormal study.  . Lower Ext. Duplex  03/12/2012   Right Proximal CIA- vessel narrowing w/elevated velocities 0-49% diameter reduction. Right SFA-mild mixed density plaque throughout vessel.  [name] NM MYOCAR PERF WALL MOTION  05/19/2010   protocol: Persantine, post stress EF 65%, negative for ischemia, low risk scan  . TRANSTHORACIC ECHOCARDIOGRAM  08/29/2012   Moderately calcified annulus of mitral valve, moderate regurg. of both mitral valve and tricuspid valve.     Prior to Admission medications   Medication Sig Start Date End Date Taking? Authorizing Provider  acetaminophen (TYLENOL) 325 MG tablet Take 325-650 mg by mouth every 6 (six) hours as needed (for pain or headaches).   Yes [provider]  ALPRAZolam (XANAX) 0.5 MG tablet Take 0.25-0.5 mg by mouth 2 (two) times daily as needed for anxiety.    Yes [provider]  aspirin EC 81 MG tablet Take 81 mg by mouth every Monday, Wednesday, and Friday.    Yes [provider]  atorvastatin (LIPITOR) 80 MG tablet Take 1 tablet (80 mg total) by mouth daily. Patient taking differently: Take 80 mg by mouth daily with supper.  09/25/17  Yes Lorretta Harp, MD  Cholecalciferol (VITAMIN D3) 1000 UNITS CAPS Take 1,000 Units by mouth daily.    Yes [provider]  furosemide (LASIX) 40 MG tablet Take 60 mg by mouth daily.    Yes [provider]  glipiZIDE (GLUCOTROL XL) 2.5 MG 24 hr tablet Take 2.5 mg by mouth daily.  04/04/17  Yes [provider]  hydrALAZINE (APRESOLINE) 100 MG tablet Take 1 tablet (100 mg total) by mouth 3 (three) times daily. 11/07/17  Yes Lorretta Harp, MD  irbesartan (AVAPRO) 300 MG tablet Take 1 tablet (300 mg total) by mouth daily. 09/25/17  Yes Lorretta Harp, MD  MAGNESIUM PO Take 1 tablet by mouth daily.   Yes [provider]  metFORMIN (GLUCOPHAGE) 500 MG tablet Take 500 mg by mouth 2 (two) times daily with a meal.    Yes [provider]  metoprolol tartrate (LOPRESSOR) 50 MG tablet Take 50 mg by mouth 2 (two) times daily. 10/16/17  Yes [provider]  pantoprazole (PROTONIX) 40 MG tablet Take 40 mg by mouth daily.  12/05/14  Yes [provider]  Polyethyl Glyc-Propyl Glyc PF (SYSTANE PRESERVATIVE FREE) 0.4-0.3 % SOLN Apply 1-2 drops to eye 3 (three) times daily as needed (for irritation).   Yes [provider]  traMADol (ULTRAM) 50 MG tablet Take 50 mg by mouth every 6 (six) hours as needed for moderate pain or severe pain.    Yes [provider]  vitamin B-12 (CYANOCOBALAMIN) 100 MCG tablet Take 100 mcg by mouth daily.    Yes [provider]  warfarin (COUMADIN) 5 MG tablet TAKE 1 TO 1.5 (ONE TO ONE & ONE-HALF) TABLETS BY MOUTH DAILY AS DIRECTED BY COUMADIN CLINIC Patient taking differently: Take 5 mg by mouth in the morning on Sun/Tues/Thurs/Sat and 7.5 mg on Mon/Wed/Fri 09/10/17  Yes Lorretta Harp, MD  white petrolatum (VASELINE PURE ULTRA WHITE) GEL Apply 1 application topically as needed (to hemorrhoids).   Yes [provider]    Scheduled Meds: . aspirin EC  81 mg Oral Daily  . atorvastatin  80 mg Oral Daily  . cholecalciferol  1,000 Units Oral Daily  . furosemide  60 mg Oral Daily  . hydrALAZINE  100 mg Oral Q8H  . irbesartan  300 mg Oral Daily  . pantoprazole  40 mg Oral Daily  . sodium chloride flush  3 mL Intravenous Q12H  . vitamin B-12  100 mcg Oral Daily  . warfarin  5 mg Oral ONCE-1800  . Warfarin - Pharmacist Dosing Inpatient   Does not apply q1800   Continuous Infusions: PRN Meds:.ALPRAZolam, polyethylene glycol, traMADol  Allergies as of 01/18/2018 - Review Complete 01/18/2018  Allergen Reaction Noted  . Coreg [carvedilol] Other (See  Comments) 01/18/2018  . Losartan Swelling 08/22/2013  . Spironolactone Nausea Only 01/18/2018  . Verapamil Hives 08/22/2013  . Zetia [ezetimibe] Other (See Comments) 10/06/2011  . Zocor [simvastatin - high dose] Other (See Comments) 10/06/2011    Family History  Problem Relation Age of Onset  . Breast cancer Neg Hx     Social History   Socioeconomic History  . Marital status: Widowed    Spouse name: Not on file  . Number of children: Not on file  . Years of education: Not on file  . Highest education level: Not on file  Occupational History  . Not on file  Social Needs  . Financial resource strain: Not on file  . Food insecurity:    Worry: Not on file    Inability: Not on file  . Transportation needs:    Medical: Not on file    Non-medical: Not on file  Tobacco Use  . Smoking status: Former Smoker    Packs/day:  1.00    Years: 30.00    Pack years: 30.00    Types: Cigarettes  . Smokeless tobacco: Never Used  Substance and Sexual Activity  . Alcohol use: No    Alcohol/week: 0.0 oz  . Drug use: No  . Sexual activity: Not on file  Lifestyle  . Physical activity:    Days per week: Not on file    Minutes per session: Not on file  . Stress: Not on file  Relationships  . Social connections:    Talks on phone: Not on file    Gets together: Not on file    Attends religious service: Not on file    Active member of club or organization: Not on file    Attends meetings of clubs or organizations: Not on file    Relationship status: Not on file  . Intimate partner violence:    Fear of current or ex partner: Not on file    Emotionally abused: Not on file    Physically abused: Not on file    Forced sexual activity: Not on file  Other Topics Concern  . Not on file  Social History Narrative  . Not on file    Review of Systems: All negative except as stated above in HPI.  Physical Exam: Vital signs: Vitals:   01/19/18 1100 01/19/18 1255  BP: (!) 160/52 (!) 160/58   Pulse: 99 96  Resp: 16 20  Temp: 98.6 F (37 C) 98.8 F (37.1 C)  SpO2: 96% 94%   Last BM Date: 01/18/18 General:  Lethargic, elderly, no acute distress, well-nourished Head: normocephalic, atraumatic Eyes: anicteric sclera ENT: oropharynx clear Neck: supple, nontender Lungs:  Clear throughout to auscultation.   No wheezes, crackles, or rhonchi. No acute distress. Heart:  Regular rate and rhythm; no murmurs, clicks, rubs,  or gallops. Abdomen: soft, nontender, nondistended, +BS  Rectal:  Deferred Ext: trace LE edema  GI:  Lab Results: Recent Labs    01/18/18 1049 01/18/18 2052 01/19/18 0618  WBC 3.8*  4.0  --  3.7*  HGB 7.1*  7.1* 7.9* 7.6*  HCT 23.2*  23.2* 25.5* 24.6*  PLT 183  241  --  213   BMET Recent Labs    01/18/18 1049 01/19/18 0618  NA 133* 132*  K 4.5 3.9  CL 104 104  CO2 18* 18*  GLUCOSE 194* 133*  BUN 12 10  CREATININE 0.78 0.59  CALCIUM 8.6* 8.2*   LFT Recent Labs    01/19/18 0618  PROT 5.7*  ALBUMIN 2.6*  AST 33  ALT 19  ALKPHOS 66  BILITOT 1.4*   PT/INR Recent Labs    01/18/18 1049 01/19/18 0618  LABPROT 26.3* 28.7*  INR 2.44 2.72     Studies/Results: Ct Abdomen Pelvis W Wo Contrast  Result Date: 01/18/2018 CLINICAL DATA:  Low hemoglobin. Question pancreatic lesion on ultrasound same day. Nausea for 3 months. RIGHT upper quadrant pain EXAM: CT ABDOMEN AND PELVIS WITHOUT AND WITH CONTRAST TECHNIQUE: Multidetector CT imaging of the abdomen and pelvis was performed following the standard protocol before and following the bolus administration of intravenous contrast. CONTRAST:  12mL ISOVUE-300 IOPAMIDOL (ISOVUE-300) INJECTION 61% COMPARISON:  Ultrasound 01/17/2018 FINDINGS: Lower chest: Small bilateral pleural effusions, LEFT greater than RIGHT. Calcified granuloma in the LEFT lower lobe. Hepatobiliary: No focal hepatic lesion. The gallbladder is collapsed around multiple gallstones. No biliary duct dilatation. Common bile duct  normal caliber Pancreas: There is no pancreatic duct dilatation. No pancreatic mass identified in  the head of the pancreas. There is periampullary duodenum diverticulum present measuring 2.4 cm. Spleen: Normal spleen Adrenals/urinary tract: Adrenal glands and kidneys are normal. The ureters and bladder normal. Stomach/Bowel: Stomach normal. Periampullary duodenum diverticulum. A second duodenum diverticulum along the third person of the duodenum measuring 2.4 cm (image 19/9). The small bowel is normal. Appendix and terminal ileum are normal. Multiple diverticula of the sigmoid colon without acute inflammation. Vascular/Lymphatic: Abdominal aorta is normal caliber with atherosclerotic calcification. There is no retroperitoneal or periportal lymphadenopathy. No pelvic lymphadenopathy. Reproductive: Post hysterectomy anatomy Other: No free fluid. Musculoskeletal: No aggressive osseous lesion. IMPRESSION: 1. No evidence of pancreatic head mass. 2. Peri ampullary duodenal diverticulum. Second diverticulum in the third portion duodenum. 3. Gallbladder completely collapsed around multiple gallstones. Findings suggest chronic cholecystitis. Consider HIDA scan with functional assessment (ejection fraction). 4. Bilateral small effusions. 5. Extensive colonic diverticulosis without evidence diverticulitis. 6.  Aortic Atherosclerosis (ICD10-I70.0). Electronically Signed   By: Suzy Bouchard M.D.   On: 01/18/2018 16:28    Impression/Plan: Symptomatic anemia without overt bleeding in the setting of chronic Coumadin. Needs an inpatient GI workup with EGD/colonoscopy as the next step. Will prep for colonoscopy on Sunday or Monday depending on INR level. Need INR less than 2.0 to do procedure. Her Coumadin will be held and she will be on IV Heparin and will be managed by Dr. Verlon Au and pharmacy. Soft diet today until day of prep and then will do clear liquids. Supportive care. Will follow.    LOS: 0 days    [place] C.  01/19/2018, 4:28 PM  Pager [phone]  AFTER 5 pm or on weekends please call [phone]

## 2018-01-20 LAB — CBC
HEMATOCRIT: 28.1 % — AB (ref 36.0–46.0)
Hemoglobin: 8.8 g/dL — ABNORMAL LOW (ref 12.0–15.0)
MCH: 23.9 pg — ABNORMAL LOW (ref 26.0–34.0)
MCHC: 31.3 g/dL (ref 30.0–36.0)
MCV: 76.4 fL — ABNORMAL LOW (ref 78.0–100.0)
Platelets: 159 10*3/uL (ref 150–400)
RBC: 3.68 MIL/uL — ABNORMAL LOW (ref 3.87–5.11)
RDW: 16.6 % — ABNORMAL HIGH (ref 11.5–15.5)
WBC: 3.1 10*3/uL — AB (ref 4.0–10.5)

## 2018-01-20 LAB — PROTIME-INR
INR: 3.27
INR: 3.77
Prothrombin Time: 33.1 seconds — ABNORMAL HIGH (ref 11.4–15.2)
Prothrombin Time: 36.9 seconds — ABNORMAL HIGH (ref 11.4–15.2)

## 2018-01-20 LAB — GLUCOSE, CAPILLARY
GLUCOSE-CAPILLARY: 148 mg/dL — AB (ref 65–99)
GLUCOSE-CAPILLARY: 236 mg/dL — AB (ref 65–99)
Glucose-Capillary: 147 mg/dL — ABNORMAL HIGH (ref 65–99)
Glucose-Capillary: 201 mg/dL — ABNORMAL HIGH (ref 65–99)

## 2018-01-20 MED ORDER — VITAMIN K1 10 MG/ML IJ SOLN
10.0000 mg | Freq: Once | INTRAVENOUS | Status: AC
  Administered 2018-01-20: 10 mg via INTRAVENOUS
  Filled 2018-01-20: qty 1

## 2018-01-20 MED ORDER — VITAMIN K1 10 MG/ML IJ SOLN
10.0000 mg | Freq: Once | INTRAMUSCULAR | Status: AC
  Administered 2018-01-20: 10 mg via INTRAMUSCULAR
  Filled 2018-01-20: qty 5

## 2018-01-20 MED ORDER — SODIUM CHLORIDE 0.9 % IV SOLN
INTRAVENOUS | Status: DC
  Administered 2018-01-20: 18:00:00 via INTRAVENOUS

## 2018-01-20 MED ORDER — PEG 3350-KCL-NA BICARB-NACL 420 G PO SOLR
4000.0000 mL | Freq: Once | ORAL | Status: AC
  Administered 2018-01-21: 4000 mL via ORAL
  Filled 2018-01-20: qty 4000

## 2018-01-20 NOTE — H&P (View-Only) (Signed)
Patient ID: [name], female   DOB: 04-Apr-1941, 77 y.o.   MRN: [id number]  Awake resting in bed. Denies abdominal pain. INR 3.27. Hgb 8.8 (7.6). S/P Vit K IM today. Coumadin on hold and Heparin drip to start per pharmacy. Tentatively will prep for colonoscopy tomorrow and plan to do EGD/colon on 01/22/18. Clear liquid diet tomorrow.

## 2018-01-20 NOTE — Progress Notes (Signed)
Patient ID: [name], female   DOB: [birth year], 77 y.o.   MRN: [id number]  Awake resting in bed. Denies abdominal pain. INR 3.27. Hgb 8.8 (7.6). S/P Vit K IM today. Coumadin on hold and Heparin drip to start per pharmacy. Tentatively will prep for colonoscopy tomorrow and plan to do EGD/colon on 01/22/18. Clear liquid diet tomorrow.

## 2018-01-20 NOTE — Progress Notes (Signed)
Hospitalist progress note   [name]  NLG:[id number] DOB: 1940/11/16 DOA: 01/18/2018 PCP: [name], MD Specialists:    Brief Narrative:  77 year old female known history of CABG X2 (pharmacological stress test 2014 falls positive) to [place] AVR 10/5/5--[last echo showed valve gradient slightly elevated compared to prior], HTN, HLD, DM, prior tobacco abuse, PVD with left common iliac stenosis + valvular disease, onychogryphosis foll by DR.   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  But is on Coumadin with need for [facility] to INR 2.5-->3.5    Assessment & Plan:   GI bleed- unexplained drop in hemoglobin-she has never had a colonoscopy.  +hemorrhoids x 3 transfuse 1 unit of packed red blood cells Rpt transfusion 1 u prbc 4/13--- hemoglobin is now 8.8 GI evalauted--will scope when INR ~1.8--Defer to them prepped Repeat INR this p.m. 700-- heparin GTT per pharmacy ordered1  Microcytic anemia-see above discussion  CABG 2005, [place] AVR with worsening AV gradient-outpatient follow-up with cardiologist-last echo was 2018-she is on aspirin 81 mg in addition to Coumadin Can keep peri-procedural aspirin  Diastolic heart failure, no acute component has had chronic swelling for the past year-continue 60 of Lasix daily, continue hydralazine 100 3 times daily,  HTN-continue Avapro 300 daily not controlled blood pressure is 160s, metoprolol 50 mg twice daily-has a reported allergy to Coreg with swelling in the feet-if this recurs with metoprolol will add amlodipine instead  Reflux continue pantoprazole 40 daily  B12 deficiency continue B12 100 mcg daily  IVs mellitus type II-holding Glucotrol XL place on sliding scale sensitive coverage blood sugar controlled 148-->236  Now on heparin GTT, inpatient, full code, discussed with son on phone 4/14   Consultants:    Gastroenterology  Procedures:   None currently  Antimicrobials:   None  Subjective: Eating drinking in no distress No chest pain No stools since admission no nausea no vomiting  Objective: Vitals:   01/19/18 2140 01/20/18 0556 01/20/18 1339 01/20/18 1340  BP: (!) 157/66 (!) 155/55 (!) 168/55   Pulse: 89 70 91   Resp: 18 18 20    Temp: 98.8 F (37.1 C) 98.5 F (36.9 C)  98.8 F (37.1 C)  TempSrc: Oral Oral    SpO2: 90% 94% 96%     Intake/Output Summary (Last 24 hours) at 01/20/2018 1431 Last data filed at 01/20/2018 0555 Gross per 24 hour  Intake -  Output 1400 ml  Net -1400 ml   There were no vitals filed for this visit.  Examination: EOMI NCAT no distress S1-S2 no murmur rub or gallop Chest clinically clear no added sound Abdomen soft nontender no rebound no guarding Neurologically intact   Data Reviewed: I have personally reviewed following labs and imaging studies  CBC: Recent Labs  Lab 01/18/18 1049 01/18/18 2052 01/19/18 0618 01/20/18 0255  WBC 3.8*  4.0  --  3.7* 3.1*  NEUTROABS 2.7  --   --   --   HGB 7.1*  7.1* 7.9* 7.6* 8.8*  HCT 23.2*  23.2* 25.5* 24.6* 28.1*  MCV 76.3*  76.8*  --  76.4* 76.4*  PLT 183  241  --  213 081   Basic Metabolic Panel: Recent Labs  Lab 01/18/18 1049 01/19/18 0618  NA 133* 132*  K 4.5 3.9  CL 104 104  CO2 18* 18*  GLUCOSE 194* 133*  BUN 12 10  CREATININE 0.78 0.59  CALCIUM 8.6*  8.2*   GFR: CrCl cannot be calculated (Unknown ideal weight.). Liver Function Tests: Recent Labs  Lab 01/19/18 0618  AST 33  ALT 19  ALKPHOS 66  BILITOT 1.4*  PROT 5.7*  ALBUMIN 2.6*   No results for input(s): LIPASE, AMYLASE in the last 168 hours. No results for input(s): AMMONIA in the last 168 hours. Coagulation Profile: Recent Labs  Lab 01/18/18 1049 01/19/18 0618 01/20/18 0255  INR 2.44 2.72 3.27   Cardiac Enzymes:  Radiology Studies: Reviewed images personally in health database   Scheduled  Meds: . aspirin EC  81 mg Oral Daily  . atorvastatin  80 mg Oral Daily  . cholecalciferol  1,000 Units Oral Daily  . furosemide  60 mg Oral Daily  . hydrALAZINE  100 mg Oral Q8H  . insulin aspart  0-5 Units Subcutaneous QHS  . insulin aspart  0-9 Units Subcutaneous TID WC  . irbesartan  300 mg Oral Daily  . magnesium oxide   Oral Daily  . metoprolol tartrate  50 mg Oral BID  . pantoprazole  40 mg Oral Daily  . sodium chloride flush  3 mL Intravenous Q12H  . vitamin B-12  100 mcg Oral Daily  . Warfarin - Pharmacist Dosing Inpatient   Does not apply q1800   Continuous Infusions:   LOS: 1 day    Time spent: [place], MD [facility] ([facility]  If 7PM-7AM, please contact night-coverage [email or web address] Password [facility] 01/20/2018, 2:31 PM

## 2018-01-20 NOTE — Progress Notes (Signed)
ANTICOAGULATION CONSULT NOTE - Follow Up Consult  Pharmacy Consult for heparin Indication: St Jude valve AVR  Allergies  Allergen Reactions  . Coreg [Carvedilol] Other (See Comments)    Terrible cramping in the feet and had a lot of bowel movements, but not diarrhea  . Losartan Swelling    Patient doesn't recall site of swelling  . Spironolactone Nausea Only    Caused extreme nausea  . Verapamil Hives  . Zetia [Ezetimibe] Other (See Comments)    Reaction not recalled  . Zocor [Simvastatin - High Dose] Other (See Comments)    Reaction not recalled    Patient Measurements:    Vital Signs: Temp: 98.5 F (36.9 C) (04/14 0556) Temp Source: Oral (04/14 0556) BP: 155/55 (04/14 0556) Pulse Rate: 70 (04/14 0556)  Labs: Recent Labs    01/18/18 1049 01/18/18 2052 01/19/18 0618 01/20/18 0255  HGB 7.1*  7.1* 7.9* 7.6* 8.8*  HCT 23.2*  23.2* 25.5* 24.6* 28.1*  PLT 183  241  --  213 159  LABPROT 26.3*  --  28.7* 33.1*  INR 2.44  --  2.72 3.27  CREATININE 0.78  --  0.59  --     CrCl cannot be calculated (Unknown ideal weight.).   Assessment: 29 YOF on Warfarin PTA for St Jude valve AVR (goal 2.5 - 3.5) INR 2.72 > 3.27, needs endoscopy for GIB w/u, hold coumadin and start heparin one INR < 2.5, s/p vitamin K 10mg  IM x 1 this morning. Likely EGD tomorrow  Home warfarin dose: 7.5 mg on MWF/ 5 mg on all other days, INR 2.44 on admission, last dose 4/12  Goal of Therapy:  Heparin level 0.3-0.7 units/ml Monitor platelets by anticoagulation protocol: Yes   Plan:  - recheck INR this evening at 1800 - start IV heparin with no bolus if INR < 2.5 - f/u plans for GI work up - f/u plans for restarting coumdin  Maryanna Shape, PharmD, BCPS  Clinical Pharmacist  Pager: [phone]   01/20/2018,11:51 AM

## 2018-01-21 LAB — CBC
HCT: 28.3 % — ABNORMAL LOW (ref 36.0–46.0)
HEMOGLOBIN: 8.8 g/dL — AB (ref 12.0–15.0)
MCH: 24 pg — AB (ref 26.0–34.0)
MCHC: 31.1 g/dL (ref 30.0–36.0)
MCV: 77.3 fL — ABNORMAL LOW (ref 78.0–100.0)
PLATELETS: 187 10*3/uL (ref 150–400)
RBC: 3.66 MIL/uL — ABNORMAL LOW (ref 3.87–5.11)
RDW: 16.6 % — AB (ref 11.5–15.5)
WBC: 2.8 10*3/uL — ABNORMAL LOW (ref 4.0–10.5)

## 2018-01-21 LAB — BASIC METABOLIC PANEL
Anion gap: 8 (ref 5–15)
BUN: 11 mg/dL (ref 6–20)
CALCIUM: 8.2 mg/dL — AB (ref 8.9–10.3)
CHLORIDE: 103 mmol/L (ref 101–111)
CO2: 23 mmol/L (ref 22–32)
CREATININE: 0.73 mg/dL (ref 0.44–1.00)
GFR calc non Af Amer: >60 mL/min (ref >60)
Glucose, Bld: 141 mg/dL — ABNORMAL HIGH (ref 65–99)
Potassium: 3.6 mmol/L (ref 3.5–5.1)
SODIUM: 134 mmol/L — AB (ref 135–145)

## 2018-01-21 LAB — GLUCOSE, CAPILLARY
GLUCOSE-CAPILLARY: 126 mg/dL — AB (ref 65–99)
Glucose-Capillary: 138 mg/dL — ABNORMAL HIGH (ref 65–99)
Glucose-Capillary: 205 mg/dL — ABNORMAL HIGH (ref 65–99)
Glucose-Capillary: 113 mg/dL — ABNORMAL HIGH (ref 65–99)

## 2018-01-21 LAB — HEPARIN LEVEL (UNFRACTIONATED): Heparin Unfractionated: 0.12 IU/mL — ABNORMAL LOW (ref 0.30–0.70)

## 2018-01-21 LAB — PROTIME-INR
INR: 1.48
PROTHROMBIN TIME: 17.8 s — AB (ref 11.4–15.2)

## 2018-01-21 MED ORDER — HEPARIN (PORCINE) IN NACL 100-0.45 UNIT/ML-% IJ SOLN
1300.0000 [IU]/h | INTRAMUSCULAR | Status: DC
  Administered 2018-01-21: 800 [IU]/h via INTRAVENOUS
  Filled 2018-01-21: qty 250

## 2018-01-21 NOTE — Progress Notes (Signed)
ANTICOAGULATION CONSULT NOTE - Initial Consult  Pharmacy Consult for Heparin (holding warfarin) Indication: Mechanical heart valve  Allergies  Allergen Reactions  . Coreg [Carvedilol] Other (See Comments)    Terrible cramping in the feet and had a lot of bowel movements, but not diarrhea  . Losartan Swelling    Patient doesn't recall site of swelling  . Spironolactone Nausea Only    Caused extreme nausea  . Verapamil Hives  . Zetia [Ezetimibe] Other (See Comments)    Reaction not recalled  . Zocor [Simvastatin - High Dose] Other (See Comments)    Reaction not recalled    Patient Measurements: ~68 kg  Vital Signs: Temp: 98.2 F (36.8 C) (04/15 0430) Temp Source: Oral (04/15 0430) BP: 156/52 (04/15 0430) Pulse Rate: 62 (04/15 0430)  Labs: Recent Labs    01/18/18 1049  01/19/18 0618 01/20/18 0255 01/20/18 1534 01/21/18 0557  HGB 7.1*  7.1*   < > 7.6* 8.8*  --  8.8*  HCT 23.2*  23.2*   < > 24.6* 28.1*  --  28.3*  PLT 183  241  --  213 159  --  187  LABPROT 26.3*  --  28.7* 33.1* 36.9* 17.8*  INR 2.44  --  2.72 3.27 3.77 1.48  CREATININE 0.78  --  0.59  --   --   --    < > = values in this interval not displayed.    CrCl cannot be calculated (Unknown ideal weight.).   Medical History: Past Medical History:  Diagnosis Date  . Carotid artery disease ([place])   . Chronic anticoagulation   . Coronary artery disease    status post coronary artery bypass grafting times 07/10/2004  . Diabetes mellitus   . Hypercholesteremia   . Hypertension   . Mechanical heart valve present    H. aortic valve replacement at the time of bypass surgery October 2005  . Peripheral arterial disease ([facility])    history of left common iliac artery PTA and stenting for a chronic total occlusion 08/26/01   Assessment:  Warfarin PTA for mechanical heart valve, holding warfarin with plans for endoscopy, INR is down to 1.48 this AM, starting heparin bridge  Goal of Therapy:  Heparin level  0.3-0.7 units/ml Monitor platelets by anticoagulation protocol: Yes   Plan:  Start heparin at 800 units/hr 1400 HL  Narda Bonds 01/21/2018,6:45 AM

## 2018-01-21 NOTE — Progress Notes (Signed)
[facility] for Heparin  Indication: Mechanical heart valve  Allergies  Allergen Reactions  . Coreg [Carvedilol] Other (See Comments)    Terrible cramping in the feet and had a lot of bowel movements, but not diarrhea  . Losartan Swelling    Patient doesn't recall site of swelling  . Spironolactone Nausea Only    Caused extreme nausea  . Verapamil Hives  . Zetia [Ezetimibe] Other (See Comments)    Reaction not recalled  . Zocor [Simvastatin - High Dose] Other (See Comments)    Reaction not recalled    Patient Measurements: ~68 kg  Vital Signs: Temp: 98.2 F (36.8 C) (04/15 2153) Temp Source: Oral (04/15 2153) BP: 185/82 (04/15 2153) Pulse Rate: 81 (04/15 2153)  Labs: Recent Labs    01/19/18 0618 01/20/18 0255 01/20/18 1534 01/21/18 0557 01/21/18 1412 01/21/18 2259  HGB 7.6* 8.8*  --  8.8*  --   --   HCT 24.6* 28.1*  --  28.3*  --   --   PLT 213 159  --  187  --   --   LABPROT 28.7* 33.1* 36.9* 17.8*  --   --   INR 2.72 3.27 3.77 1.48  --   --   HEPARINUNFRC  --   --   --   --  <0.10* 0.12*  CREATININE 0.59  --   --  0.73  --   --     CrCl cannot be calculated (Unknown ideal weight.).  Assessment:  77 y.o. female with mechanical AVR, Coumadin on hold, for heparin  Goal of Therapy:  Heparin level 0.3-0.7 units/ml Monitor platelets by anticoagulation protocol: Yes   Plan:  Increase Heparin  1200 units/hr Follow-up am labs.  Caryl Pina 01/21/2018,11:39 PM

## 2018-01-21 NOTE — Progress Notes (Signed)
ANTICOAGULATION CONSULT NOTE - Follow Up Consult  Pharmacy Consult for Heparin Indication: artificial heart valve  Allergies  Allergen Reactions  . Coreg [Carvedilol] Other (See Comments)    Terrible cramping in the feet and had a lot of bowel movements, but not diarrhea  . Losartan Swelling    Patient doesn't recall site of swelling  . Spironolactone Nausea Only    Caused extreme nausea  . Verapamil Hives  . Zetia [Ezetimibe] Other (See Comments)    Reaction not recalled  . Zocor [Simvastatin - High Dose] Other (See Comments)    Reaction not recalled    Patient Measurements:   Heparin Dosing Weight:  68 kg   Vital Signs: Temp: 98.5 F (36.9 C) (04/15 1232) Temp Source: Oral (04/15 0430) BP: 162/58 (04/15 1232) Pulse Rate: 48 (04/15 1232)  Labs: Recent Labs    01/19/18 0618 01/20/18 0255 01/20/18 1534 01/21/18 0557 01/21/18 1412  HGB 7.6* 8.8*  --  8.8*  --   HCT 24.6* 28.1*  --  28.3*  --   PLT 213 159  --  187  --   LABPROT 28.7* 33.1* 36.9* 17.8*  --   INR 2.72 3.27 3.77 1.48  --   HEPARINUNFRC  --   --   --   --  <0.10*  CREATININE 0.59  --   --  0.73  --     CrCl cannot be calculated (Unknown ideal weight.).   Assessment:  Anticoag: Warfarin PTA for St Jude valve AVR (goal 2.5 - 3.5) Needs endoscopy for GIB w/u, hold coumadin. heparin once INR < 2.5, s/p vitamin K 10mg  IM x 1 on 4/15., INR trending up 3.77-5.5 hrs after Vit K. 2nd Vit K dose ordered -- so no heparin tonight. INR 3.77>1.48 this after Vit K. Hgb stable today 8.8. Plts ok. First HL <0.1 undetectable. - 4/15: Vit K 10mg  IM x 2 doses   Goal of Therapy:  Heparin level 0.3-0.7 units/ml Monitor platelets by anticoagulation protocol: Yes   Plan:  Increase IV heparin (no bolus) to 1000 units/hr Ordered height and weight (ED assessment 68kg) Likely endoscopy 4/16 Monitor CBC, daily INR and s/s of bleeding   Billye Pickerel S. Alford Highland, PharmD, BCPS Clinical Staff Pharmacist Pager  [phone]  Eilene Ghazi Stillinger 01/21/2018,3:12 PM

## 2018-01-21 NOTE — Progress Notes (Signed)
Hospitalist progress note   [name]  NIO:[id number] DOB: 07-27-1941 DOA: 01/18/2018 PCP: [name], MD Specialists:    Brief Narrative:  77 year old female known history of CABG X2 (pharmacological stress test 2014 falls positive) to [place] AVR 10/5/5--[last echo showed valve gradient slightly elevated compared to prior], HTN, HLD, DM, prior tobacco abuse, PVD with left common iliac stenosis + valvular disease, onychogryphosis foll by DR.   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  But is on Coumadin with need for [facility] to INR 2.5-->3.5    Assessment & Plan:   GI bleed- unexplained drop in hemoglobin-she has never had a colonoscopy.  +hemorrhoids x 3 transfuse 1 unit of packed red blood cells Rpt transfusion 1 u prbc 4/13--- hemoglobin is now 8.8 GI evalauted--INR 1.4-on heparin Gtt Defer pep and endo-colo to Gi  Microcytic anemia-see above discussion  CABG 2005, Saint Jude AVR with worsening AV gradient-outpatient follow-up with cardiologist-last echo was 2018-she is on aspirin 81 mg in addition to Coumadin Can keep peri-procedural aspirin--a courtesy consulted Dr. Alvester Chou who is okay with the patient going for procedure and does not see any contraindication at this time to this  Diastolic heart failure, no acute component has had chronic swelling for the past year-continue 60 of Lasix daily, continue hydralazine 100 3 times daily,  HTN-continue Avapro 300 daily not controlled blood pressure is 160s, metoprolol 50 mg twice daily-has a reported allergy to Coreg with swelling in the feet??  Reflux continue pantoprazole 40 daily  B12 deficiency continue B12 100 mcg daily  IVs mellitus type II-holding Glucotrol XL place on sliding scale sensitive coverage blood sugar controlled 148-->236  Now on heparin GTT, inpatient, full code, discussed with son the bedside  4/15   Consultants:   Gastroenterology  Procedures:   None currently  Antimicrobials:   None  Subjective:  Awake alert oriented in nad eating some No dark nor tarry stool  Objective: Vitals:   01/20/18 1340 01/20/18 2108 01/21/18 0430 01/21/18 1232  BP:  (!) 158/56 (!) 156/52 (!) 162/58  Pulse:   62 (!) 48  Resp:  18 16 16   Temp: 98.8 F (37.1 C) 97.9 F (36.6 C) 98.2 F (36.8 C) 98.5 F (36.9 C)  TempSrc:  Oral Oral   SpO2:  95% 94% 97%    Intake/Output Summary (Last 24 hours) at 01/21/2018 1609 Last data filed at 01/21/2018 1508 Gross per 24 hour  Intake 110.83 ml  Output 2500 ml  Net -2389.17 ml   There were no vitals filed for this visit.  Examination: EOMI NCAT no distress S1-S2 no murmur rub or gallop Chest clinically clear no added sound Abdomen soft nontender no rebound no guarding Neurologically intact   Data Reviewed: I have personally reviewed following labs and imaging studies  CBC: Recent Labs  Lab 01/18/18 1049 01/18/18 2052 01/19/18 0618 01/20/18 0255 01/21/18 0557  WBC 3.8*  4.0  --  3.7* 3.1* 2.8*  NEUTROABS 2.7  --   --   --   --   HGB 7.1*  7.1* 7.9* 7.6* 8.8* 8.8*  HCT 23.2*  23.2* 25.5* 24.6* 28.1* 28.3*  MCV 76.3*  76.8*  --  76.4* 76.4* 77.3*  PLT 183  241  --  213 159 818   Basic Metabolic Panel: Recent Labs  Lab 01/18/18 1049 01/19/18 0618 01/21/18 0557  NA 133* 132* 134*  K 4.5 3.9  3.6  CL 104 104 103  CO2 18* 18* 23  GLUCOSE 194* 133* 141*  BUN 12 10 11   CREATININE 0.78 0.59 0.73  CALCIUM 8.6* 8.2* 8.2*   GFR: CrCl cannot be calculated (Unknown ideal weight.). Liver Function Tests: Recent Labs  Lab 01/19/18 0618  AST 33  ALT 19  ALKPHOS 66  BILITOT 1.4*  PROT 5.7*  ALBUMIN 2.6*   No results for input(s): LIPASE, AMYLASE in the last 168 hours. No results for input(s): AMMONIA in the last 168 hours. Coagulation Profile: Recent Labs  Lab 01/18/18 1049 01/19/18 0618 01/20/18 0255  01/20/18 1534 01/21/18 0557  INR 2.44 2.72 3.27 3.77 1.48   Cardiac Enzymes:  Radiology Studies: Reviewed images personally in health database   Scheduled Meds: . aspirin EC  81 mg Oral Daily  . atorvastatin  80 mg Oral Daily  . cholecalciferol  1,000 Units Oral Daily  . furosemide  60 mg Oral Daily  . hydrALAZINE  100 mg Oral Q8H  . insulin aspart  0-5 Units Subcutaneous QHS  . insulin aspart  0-9 Units Subcutaneous TID WC  . irbesartan  300 mg Oral Daily  . magnesium oxide   Oral Daily  . metoprolol tartrate  50 mg Oral BID  . pantoprazole  40 mg Oral Daily  . polyethylene glycol-electrolytes  4,000 mL Oral Once  . sodium chloride flush  3 mL Intravenous Q12H  . vitamin B-12  100 mcg Oral Daily   Continuous Infusions: . sodium chloride 10 mL/hr at 01/20/18 1800  . heparin 1,000 Units/hr (01/21/18 1521)     LOS: 2 days    Time spent: [place], MD [facility]  If 7PM-7AM, please contact night-coverage [email or web address] Password [facility] 01/21/2018, 4:09 PM

## 2018-01-22 ENCOUNTER — Encounter (HOSPITAL_COMMUNITY): Payer: Self-pay | Admitting: *Deleted

## 2018-01-22 ENCOUNTER — Encounter (HOSPITAL_COMMUNITY)
Admission: EM | Disposition: A | Discharge status: Home Health | Payer: Self-pay | Source: Home / Self Care | Attending: Family Medicine

## 2018-01-22 ENCOUNTER — Inpatient Hospital Stay (HOSPITAL_COMMUNITY): Payer: Medicare HMO | Admitting: Anesthesiology

## 2018-01-22 DIAGNOSIS — K2901 Acute gastritis with bleeding: Secondary | ICD-10-CM

## 2018-01-22 HISTORY — PX: ESOPHAGOGASTRODUODENOSCOPY (EGD) WITH PROPOFOL: SHX5813

## 2018-01-22 HISTORY — PX: COLONOSCOPY WITH PROPOFOL: SHX5780

## 2018-01-22 LAB — GLUCOSE, CAPILLARY
Glucose-Capillary: 114 mg/dL — ABNORMAL HIGH (ref 65–99)
Glucose-Capillary: 105 mg/dL — ABNORMAL HIGH (ref 65–99)
Glucose-Capillary: 216 mg/dL — ABNORMAL HIGH (ref 65–99)
Glucose-Capillary: 206 mg/dL — ABNORMAL HIGH (ref 65–99)

## 2018-01-22 LAB — CBC
HEMATOCRIT: 28.6 % — AB (ref 36.0–46.0)
Hemoglobin: 8.6 g/dL — ABNORMAL LOW (ref 12.0–15.0)
MCH: 23.4 pg — ABNORMAL LOW (ref 26.0–34.0)
MCHC: 30.1 g/dL (ref 30.0–36.0)
MCV: 77.7 fL — ABNORMAL LOW (ref 78.0–100.0)
PLATELETS: 184 10*3/uL (ref 150–400)
RBC: 3.68 MIL/uL — ABNORMAL LOW (ref 3.87–5.11)
RDW: 16.7 % — AB (ref 11.5–15.5)
WBC: 3.6 10*3/uL — AB (ref 4.0–10.5)

## 2018-01-22 LAB — BPAM RBC
BLOOD PRODUCT EXPIRATION DATE: 201904172359
Blood Product Expiration Date: 201904172359
Blood Product Expiration Date: 201905032359
ISSUE DATE / TIME: 201904121450
ISSUE DATE / TIME: 201904130826
UNIT TYPE AND RH: 600
UNIT TYPE AND RH: 6200
Unit Type and Rh: 600

## 2018-01-22 LAB — RENAL FUNCTION PANEL
ALBUMIN: 2.5 g/dL — AB (ref 3.5–5.0)
ANION GAP: 8 (ref 5–15)
BUN: 10 mg/dL (ref 6–20)
CALCIUM: 8.2 mg/dL — AB (ref 8.9–10.3)
CO2: 20 mmol/L — ABNORMAL LOW (ref 22–32)
Chloride: 107 mmol/L (ref 101–111)
Creatinine, Ser: 0.64 mg/dL (ref 0.44–1.00)
GFR calc Af Amer: >60 mL/min (ref >60)
GFR calc non Af Amer: >60 mL/min (ref >60)
GLUCOSE: 111 mg/dL — AB (ref 65–99)
PHOSPHORUS: 3.3 mg/dL (ref 2.5–4.6)
Potassium: 3.6 mmol/L (ref 3.5–5.1)
SODIUM: 135 mmol/L (ref 135–145)

## 2018-01-22 LAB — TYPE AND SCREEN
ABO/RH(D): A POS
Antibody Screen: NEGATIVE
Unit division: 0
Unit division: 0
Unit division: 0

## 2018-01-22 LAB — HEPARIN LEVEL (UNFRACTIONATED): HEPARIN UNFRACTIONATED: 0.28 [IU]/mL — AB (ref 0.30–0.70)

## 2018-01-22 SURGERY — COLONOSCOPY WITH PROPOFOL
Anesthesia: Monitor Anesthesia Care

## 2018-01-22 MED ORDER — WARFARIN - PHARMACIST DOSING INPATIENT
Freq: Every day | Status: DC
  Administered 2018-01-22 – 2018-01-30 (×5)

## 2018-01-22 MED ORDER — PROPOFOL 10 MG/ML IV BOLUS
INTRAVENOUS | Status: DC | PRN
  Administered 2018-01-22 (×2): 50 mg via INTRAVENOUS

## 2018-01-22 MED ORDER — LACTATED RINGERS IV SOLN
INTRAVENOUS | Status: AC | PRN
  Administered 2018-01-22: 1000 mL via INTRAVENOUS

## 2018-01-22 MED ORDER — MAGIC MOUTHWASH W/LIDOCAINE
2.0000 mL | Freq: Three times a day (TID) | ORAL | Status: DC
  Administered 2018-01-22 – 2018-01-30 (×19): 2 mL via ORAL
  Filled 2018-01-22 (×17): qty 5

## 2018-01-22 MED ORDER — PHENYLEPHRINE HCL 10 MG/ML IJ SOLN
INTRAVENOUS | Status: DC | PRN
  Administered 2018-01-22: 30 ug/min via INTRAVENOUS

## 2018-01-22 MED ORDER — PROPOFOL 500 MG/50ML IV EMUL
INTRAVENOUS | Status: DC | PRN
  Administered 2018-01-22: 40 ug/kg/min via INTRAVENOUS

## 2018-01-22 MED ORDER — LIDOCAINE HCL (CARDIAC) 20 MG/ML IV SOLN
INTRAVENOUS | Status: DC | PRN
  Administered 2018-01-22: 30 mg via INTRAVENOUS

## 2018-01-22 MED ORDER — WARFARIN SODIUM 5 MG PO TABS
10.0000 mg | ORAL_TABLET | Freq: Once | ORAL | Status: AC
  Administered 2018-01-22: 10 mg via ORAL
  Filled 2018-01-22: qty 2

## 2018-01-22 MED ORDER — HEPARIN (PORCINE) IN NACL 100-0.45 UNIT/ML-% IJ SOLN
1700.0000 [IU]/h | INTRAMUSCULAR | Status: DC
  Administered 2018-01-22 – 2018-01-25 (×4): 1300 [IU]/h via INTRAVENOUS
  Administered 2018-01-25 – 2018-01-27 (×3): 1600 [IU]/h via INTRAVENOUS
  Administered 2018-01-27: 1700 [IU]/h via INTRAVENOUS
  Filled 2018-01-22 (×9): qty 250

## 2018-01-22 SURGICAL SUPPLY — 25 items

## 2018-01-22 NOTE — Progress Notes (Signed)
ANTICOAGULATION CONSULT NOTE - Initial Consult  Pharmacy Consult for Coumadin Indication: aritificial heart valve  Allergies  Allergen Reactions  . Coreg [Carvedilol] Other (See Comments)    Terrible cramping in the feet and had a lot of bowel movements, but not diarrhea  . Losartan Swelling    Patient doesn't recall site of swelling  . Spironolactone Nausea Only    Caused extreme nausea  . Verapamil Hives  . Zetia [Ezetimibe] Other (See Comments)    Reaction not recalled  . Zocor [Simvastatin - High Dose] Other (See Comments)    Reaction not recalled    Patient Measurements: Height: 5\' 1"  (154.9 cm) Weight: 150 lb (68 kg) IBW/kg (Calculated) : 47.8 Heparin Dosing Weight:  68 kg  Vital Signs: Temp: 98.4 F (36.9 C) (04/16 1452) Temp Source: Oral (04/16 1452) BP: 181/43 (04/16 1452) Pulse Rate: 64 (04/16 1452)  Labs: Recent Labs    01/20/18 0255 01/20/18 1534 01/21/18 0557 01/21/18 1412 01/21/18 2259 01/22/18 0736  HGB 8.8*  --  8.8*  --   --  8.6*  HCT 28.1*  --  28.3*  --   --  28.6*  PLT 159  --  187  --   --  184  LABPROT 33.1* 36.9* 17.8*  --   --   --   INR 3.27 3.77 1.48  --   --   --   HEPARINUNFRC  --   --   --  <0.10* 0.12* 0.28*  CREATININE  --   --  0.73  --   --  0.64    Estimated Creatinine Clearance: 50.3 mL/min (by C-G formula based on SCr of 0.64 mg/dL).   Medical History: Past Medical History:  Diagnosis Date  . Carotid artery disease ([place])   . Chronic anticoagulation   . Coronary artery disease    status post coronary artery bypass grafting times 07/10/2004  . Diabetes mellitus   . Hypercholesteremia   . Hypertension   . Mechanical heart valve present    H. aortic valve replacement at the time of bypass surgery October 2005  . Peripheral arterial disease ([facility])    history of left common iliac artery PTA and stenting for a chronic total occlusion 08/26/01    Assessment: Anticoag: Warfarin PTA for St Jude valve AVR (goal 2.5 -  3.5) Needs endoscopy for GIB w/u, hold coumadin. heparin once INR < 2.5, s/p vitamin K 10mg  IM x 2 on 4/15, Heparin level 0.28 this AM. Hgb 8.6 stable. Plts stable. GI ok to resume anticoagulation post-EGD/colonoscopy. - 4/15: Vit K 10mg  IM x 2 doses - Home warfarin dose: 7.5 mg on MWF/ 5 mg on all other days, INR 2.44 on admission, last dose 4/12  Goal of Therapy:  Heparin level 0.3-0.7 units/ml  INR 2.5-3.5 Monitor platelets by anticoagulation protocol: Yes   Plan:  IV heparin to resume at 1300 units/hr. Check heparin level in 6-8 hrs Monitor heparin level, CBC, daily INR and s/s of bleeding  Coumadin 10mg  po x 1 tonight.   Julienne Vogler S. Alford Highland, PharmD, BCPS Clinical Staff Pharmacist Pager [phone]  Eilene Ghazi Stillinger 01/22/2018,3:15 PM

## 2018-01-22 NOTE — Progress Notes (Signed)
ANTICOAGULATION CONSULT NOTE - Follow Up Consult  Pharmacy Consult for Heparin Indication: heart valve  Allergies  Allergen Reactions  . Coreg [Carvedilol] Other (See Comments)    Terrible cramping in the feet and had a lot of bowel movements, but not diarrhea  . Losartan Swelling    Patient doesn't recall site of swelling  . Spironolactone Nausea Only    Caused extreme nausea  . Verapamil Hives  . Zetia [Ezetimibe] Other (See Comments)    Reaction not recalled  . Zocor [Simvastatin - High Dose] Other (See Comments)    Reaction not recalled    Patient Measurements:   Heparin Dosing Weight:  68 kg  Vital Signs: Temp: 99.1 F (37.3 C) (04/16 0544) Temp Source: Oral (04/16 0544) BP: 159/44 (04/16 0544) Pulse Rate: 72 (04/16 0544)  Labs: Recent Labs    01/20/18 0255 01/20/18 1534 01/21/18 0557 01/21/18 1412 01/21/18 2259 01/22/18 0736  HGB 8.8*  --  8.8*  --   --  8.6*  HCT 28.1*  --  28.3*  --   --  28.6*  PLT 159  --  187  --   --  184  LABPROT 33.1* 36.9* 17.8*  --   --   --   INR 3.27 3.77 1.48  --   --   --   HEPARINUNFRC  --   --   --  <0.10* 0.12* 0.28*  CREATININE  --   --  0.73  --   --  0.64    CrCl cannot be calculated (Unknown ideal weight.).  Assessment: Anticoag: Warfarin PTA for St Jude valve AVR (goal 2.5 - 3.5) Needs endoscopy for GIB w/u, hold coumadin. heparin once INR < 2.5, s/p vitamin K 10mg  IM x 2 on 4/15, Heparin level 0.28 this AM almost to goal. Hgb 8.6 stable. Plts stable. EGD/colonoscopy today to eval for GIB  - 4/15: Vit K 10mg  IM x 2 doses - Home warfarin dose: 7.5 mg on MWF/ 5 mg on all other days, INR 2.44 on admission, last dose 4/12  Goal of Therapy:  Heparin level 0.3-0.7 units/ml Monitor platelets by anticoagulation protocol: Yes   Plan:  -Heparin off around 0900 per RN. EGD/colonoscopy scheduled for 1300 -Post-procedure, resume heparin at increased rate of 1300 units/hr and check level in 6 hrs. -Ordered height and  weight (ED assessment 68kg)-2 orders in Epic, still not done. -F/u after EGD, colonoscopy 4/16 -Monitor CBC, daily INR and s/s of bleeding    Sama Arauz S. Alford Highland, PharmD, BCPS Clinical Staff Pharmacist Pager [phone]  Eilene Ghazi Stillinger 01/22/2018,10:28 AM

## 2018-01-22 NOTE — Transfer of Care (Signed)
Immediate Anesthesia Transfer of Care Note  Patient: [name]  Procedure(s) Performed: COLONOSCOPY WITH PROPOFOL (N/A ) ESOPHAGOGASTRODUODENOSCOPY (EGD) WITH PROPOFOL (N/A )  Patient Location: PACU and Endoscopy Unit  Anesthesia Type:MAC  Level of Consciousness: awake, sedated and patient cooperative  Airway & Oxygen Therapy: Patient Spontanous Breathing and Patient connected to nasal cannula oxygen  Post-op Assessment: Report given to RN, Post -op Vital signs reviewed and stable and Patient moving all extremities  Post vital signs: Reviewed and stable  Last Vitals:  Vitals Value Taken Time  BP 145/44 01/22/2018  2:13 PM  Temp    Pulse 101 01/22/2018  2:15 PM  Resp 21 01/22/2018  2:15 PM  SpO2 98 % 01/22/2018  2:15 PM  Vitals shown include unvalidated device data.  Last Pain:  Vitals:   01/22/18 1413  TempSrc:   PainSc: 0-No pain      Patients Stated Pain Goal: 0 (08/81/10 3159)  Complications: No apparent anesthesia complications

## 2018-01-22 NOTE — Anesthesia Preprocedure Evaluation (Signed)
Anesthesia Evaluation  Patient identified by MRN, date of birth, ID band Patient awake    Reviewed: Allergy & Precautions, H&P , Patient's Chart, lab work & pertinent test results  Airway Mallampati: II  TM Distance: >3 FB Neck ROM: full    Dental no notable dental hx.    Pulmonary former smoker,    Pulmonary exam normal breath sounds clear to auscultation       Cardiovascular Exercise Tolerance: Good hypertension,  Rhythm:regular Rate:Normal     Neuro/Psych    GI/Hepatic   Endo/Other  diabetes  Renal/GU      Musculoskeletal   Abdominal   Peds  Hematology  (+) anemia ,   Anesthesia Other Findings Good EF DM   Reproductive/Obstetrics                             Anesthesia Physical Anesthesia Plan  ASA: III  Anesthesia Plan: MAC   Post-op Pain Management:    Induction: Intravenous  PONV Risk Score and Plan:   Airway Management Planned: Mask and Natural Airway  Additional Equipment:   Intra-op Plan:   Post-operative Plan:   Informed Consent: I have reviewed the patients History and Physical, chart, labs and discussed the procedure including the risks, benefits and alternatives for the proposed anesthesia with the patient or authorized representative who has indicated his/her understanding and acceptance.     Plan Discussed with:   Anesthesia Plan Comments:         Anesthesia Quick Evaluation

## 2018-01-22 NOTE — Op Note (Signed)
[facility] Patient Name: [name] Procedure Date : 01/22/2018 MRN: [id number] Attending MD: [name] , MD Date of Birth: [birth year] CSN: [id number] Age: 77 Admit Type: Inpatient Procedure:                Upper GI endoscopy Indications:              Iron deficiency anemia Providers:                [name], MD, Cleda Daub, RN, Tinnie Gens, Technician, Izora Gala, CRNA Referring MD:             hospital team Medicines:                Propofol per Anesthesia, Monitored Anesthesia Care Complications:            No immediate complications. Estimated Blood Loss:     Estimated blood loss: none. Procedure:                Pre-Anesthesia Assessment:                           - Prior to the procedure, a History and Physical                            was performed, and patient medications and                            allergies were reviewed. [name] tolerance of                            previous anesthesia was also reviewed. The risks                            and benefits of the procedure and the sedation                            options and risks were discussed with the patient.                            All questions were answered, and informed consent                            was obtained. Prior Anticoagulants: The patient has                            taken no previous anticoagulant or antiplatelet                            agents. ASA Grade Assessment: III - A patient with                            severe systemic disease. After reviewing the risks  and benefits, the patient was deemed in                            satisfactory condition to undergo the procedure.                           After obtaining informed consent, the endoscope was                            passed under direct vision. Throughout the                            procedure, the patient's blood  pressure, pulse, and                            oxygen saturations were monitored continuously. The                            [id number] ([id number]) scope was introduced through the                            mouth, and advanced to the second part of duodenum.                            The upper GI endoscopy was accomplished without                            difficulty. The patient tolerated the procedure                            well. Scope In: Scope Out: Findings:      The examined esophagus was normal.      The Z-line was regular and was found 40 cm from the incisors.      Localized mildly erythematous mucosa without bleeding was found in the       gastric antrum.      The exam of the stomach was otherwise normal.      The cardia and gastric fundus were normal on retroflexion.      The examined duodenum was normal. Impression:               - Normal esophagus.                           - Z-line regular, 40 cm from the incisors.                           - Erythematous mucosa in the antrum.                           - Normal examined duodenum.                           - No specimens collected. Recommendation:           - Cardiac diet.                           -  Observe patient's clinical course. Procedure Code(s):        --- Professional ---                           [phone], Esophagogastroduodenoscopy, flexible,                            transoral; diagnostic, including collection of                            specimen(s) by brushing or washing, when performed                            (separate procedure) Diagnosis Code(s):        --- Professional ---                           D50.9, Iron deficiency anemia, unspecified                           K31.89, Other diseases of stomach and duodenum CPT copyright 2017 [employer]. All rights reserved. The codes documented in this report are preliminary and upon coder review may  be revised to meet current compliance  requirements. [name], MD 01/22/2018 2:11:38 PM This report has been signed electronically. Number of Addenda: 0

## 2018-01-22 NOTE — Op Note (Signed)
[facility] Patient Name: [name] Procedure Date : 01/22/2018 MRN: [id number] Attending MD: [name] , MD Date of Birth: 1941-03-15 CSN: [id number] Age: 77 Admit Type: Inpatient Procedure:                Colonoscopy Indications:              This is the patient's first colonoscopy, Iron                            deficiency anemia Providers:                [name], MD, Cleda Daub, RN, Tinnie Gens, Technician, Izora Gala, CRNA Referring MD:             hospital team Medicines:                Propofol per Anesthesia, Monitored Anesthesia Care Complications:            No immediate complications. Estimated Blood Loss:     Estimated blood loss: none. Procedure:                Pre-Anesthesia Assessment:                           - Prior to the procedure, a History and Physical                            was performed, and patient medications and                            allergies were reviewed. [name] tolerance of                            previous anesthesia was also reviewed. The risks                            and benefits of the procedure and the sedation                            options and risks were discussed with the patient.                            All questions were answered, and informed consent                            was obtained. Prior Anticoagulants: The patient has                            taken no previous anticoagulant or antiplatelet                            agents. ASA Grade Assessment: III - A patient with  severe systemic disease. After reviewing the risks                            and benefits, the patient was deemed in                            satisfactory condition to undergo the procedure.                           - Prior to the procedure, a History and Physical                            was performed, and patient medications and                      allergies were reviewed. [name] tolerance of                            previous anesthesia was also reviewed. The risks                            and benefits of the procedure and the sedation                            options and risks were discussed with the patient.                            All questions were answered, and informed consent                            was obtained. Prior Anticoagulants: The patient has                            taken no previous anticoagulant or antiplatelet                            agents. ASA Grade Assessment: III - A patient with                            severe systemic disease. After reviewing the risks                            and benefits, the patient was deemed in                            satisfactory condition to undergo the procedure.                           After obtaining informed consent, the colonoscope                            was passed under direct vision. Throughout the  procedure, the patient's blood pressure, pulse, and                            oxygen saturations were monitored continuously. The                            EC-[id number] ([id number]) scope was introduced through                            the anus and advanced to the the cecum, identified                            by appendiceal orifice and ileocecal valve. The                            colonoscopy was performed with difficulty due to                            multiple diverticula in the colon, significant                            looping, a tortuous colon and fair prep. Successful                            completion of the procedure was aided by                            straightening and shortening the scope to obtain                            bowel loop reduction, applying abdominal pressure                            and lavage. The patient tolerated the procedure                            fairly  well. The quality of the bowel preparation                            was fair. The ileocecal valve, appendiceal orifice,                            and rectum were photographed. Scope In: 1:43:06 PM Scope Out: 2:02:58 PM Scope Withdrawal Time: 0 hours 4 minutes 21 seconds  Total Procedure Duration: 0 hours 19 minutes 52 seconds  Findings:      The perianal and digital rectal examinations were normal.      Multiple small and large-mouthed diverticula were found in the sigmoid       colon and descending colon.      Internal hemorrhoids were found during retroflexion. The hemorrhoids       were medium-sized and Grade I (internal hemorrhoids that do not       prolapse). Impression:               -  Preparation of the colon was fair.                           - Diverticulosis in the sigmoid colon and in the                            descending colon.                           - Internal hemorrhoids.                           - No specimens collected. Recommendation:           - High fiber diet and cardiac diet.                           - Repeat colonoscopy is not recommended for                            screening purposes. Procedure Code(s):        --- Professional ---                           [phone], Colonoscopy, flexible; diagnostic, including                            collection of specimen(s) by brushing or washing,                            when performed (separate procedure) Diagnosis Code(s):        --- Professional ---                           D50.9, Iron deficiency anemia, unspecified                           K64.0, First degree hemorrhoids                           K57.30, Diverticulosis of large intestine without                            perforation or abscess without bleeding CPT copyright 2017 American Medical Association. All rights reserved. The codes documented in this report are preliminary and upon coder review may  be revised to meet current compliance  requirements. [name], MD 01/22/2018 2:15:19 PM This report has been signed electronically. Number of Addenda: 0

## 2018-01-22 NOTE — Interval H&P Note (Signed)
History and Physical Interval Note:  01/22/2018 1:11 PM  [name]  has presented today for surgery, with the diagnosis of Anemia  The various methods of treatment have been discussed with the patient and family. After consideration of risks, benefits and other options for treatment, the patient has consented to  Procedure(s): COLONOSCOPY WITH PROPOFOL (N/A) ESOPHAGOGASTRODUODENOSCOPY (EGD) WITH PROPOFOL (N/A) as a surgical intervention .  [name] history has been reviewed, patient examined, no change in status, stable for surgery.  I have reviewed the patient's chart and labs.  Questions were answered to the patient's satisfaction.     [place] C.

## 2018-01-22 NOTE — Brief Op Note (Addendum)
Minimal antral gastritis on EGD. Diffuse left-sided diverticulosis and medium-sized internal hemorrhoids. Suspect anemia due to mucosal bleeding from anticoagulation. No further GI procedures planned. Follow H/Hs. Ok to resume Coumadin from GI standpoint. Heart healthy diet. F/U with GI prn. Will sign off. Call if questions.

## 2018-01-22 NOTE — Progress Notes (Signed)
Hospitalist progress note   [name]  HYI:[id number] DOB: 21-Sep-1941 DOA: 01/18/2018 PCP: [name], MD Specialists:    Brief Narrative:   73- fem known history of CABG X2 (pharmacological stress test 2014 falls positive) to [place] AVR 10/5/5--[last echo showed valve gradient slightly elevated compared to prior], HTN, HLD, DM, prior tobacco abuse, PVD with left common iliac stenosis + valvular disease, onychogryphosis foll by DR.   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  But is on Coumadin with need for [facility] to INR 2.5-->3.5    Assessment & Plan:  L sided diverticulosis + int hemorrhoids +hemorrhoids x 3 on exam transfuse 1 unit of packed red blood cells Rpt transfusion 1 u prbc 4/13--- hemoglobin is now 8.8-->8.6 GI evalauted--INR 1.4-on heparin Gtt  Microcytic anemia-see above discussion  Mech Heart valve-needs bridging AC to therapeutic INR 2.5--pharmacy to dose-INR and cbc in am  CABG 2005, [place] with worsening AV gradient-outpatient follow-up with cardiologist-last echo was 2018-she is on aspirin 81 mg in addition to Coumadin Can keep peri-procedural aspirin  Diastolic heart failure, no acute component has had chronic swelling for the past year-continue 60 of Lasix daily, continue hydralazine 100 3 times daily,  HTN-continue Avapro 300 daily not controlled blood pressure is 160s, metoprolol 50 mg twice daily-has a reported allergy to Coreg with swelling in the feet??  Reflux continue pantoprazole 40 daily  B12 deficiency continue B12 100 mcg daily  IVs mellitus type II-holding Glucotrol XL place on sliding scale sensitive coverage blood sugar controlled 148-->236  Now on heparin GTT, inpatient, full code,await therapeutic   Consultants:   Gastroenterology  Procedures:   None currently  Antimicrobials:   None  Subjective:  Awake  alert no distress No stool  Eating now no cp No fever  Objective: Vitals:   01/22/18 1449 01/22/18 1450 01/22/18 1451 01/22/18 1452  BP:    (!) 181/43  Pulse: 67 64 61 64  Resp: 19 (!) 22 19 (!) 22  Temp:    98.4 F (36.9 C)  TempSrc:    Oral  SpO2: 97% 97% 96% 97%  Weight:      Height:        Intake/Output Summary (Last 24 hours) at 01/22/2018 1544 Last data filed at 01/22/2018 1402 Gross per 24 hour  Intake 1081.37 ml  Output 500 ml  Net 581.37 ml   Filed Weights   01/22/18 1226  Weight: 68 kg (150 lb)    Examination:  EOMI NCAT no distress S1-S2 no murmur rub or gallop Chest clinically clear no added sound Abdomen soft nontender no rebound no guarding Neurologically intact  Data Reviewed: I have personally reviewed following labs and imaging studies  CBC: Recent Labs  Lab 01/18/18 1049 01/18/18 2052 01/19/18 0618 01/20/18 0255 01/21/18 0557 01/22/18 0736  WBC 3.8*  4.0  --  3.7* 3.1* 2.8* 3.6*  NEUTROABS 2.7  --   --   --   --   --   HGB 7.1*  7.1* 7.9* 7.6* 8.8* 8.8* 8.6*  HCT 23.2*  23.2* 25.5* 24.6* 28.1* 28.3* 28.6*  MCV 76.3*  76.8*  --  76.4* 76.4* 77.3* 77.7*  PLT 183  241  --  213 159 187 786   Basic Metabolic Panel: Recent Labs  Lab 01/18/18 1049 01/19/18 0618 01/21/18 0557 01/22/18 0736  NA 133* 132* 134* 135  K 4.5 3.9 3.6  3.6  CL 104 104 103 107  CO2 18* 18* 23 20*  GLUCOSE 194* 133* 141* 111*  BUN 12 10 11 10   CREATININE 0.78 0.59 0.73 0.64  CALCIUM 8.6* 8.2* 8.2* 8.2*  PHOS  --   --   --  3.3   GFR: Estimated Creatinine Clearance: 50.3 mL/min (by C-G formula based on SCr of 0.64 mg/dL). Liver Function Tests: Recent Labs  Lab 01/19/18 0618 01/22/18 0736  AST 33  --   ALT 19  --   ALKPHOS 66  --   BILITOT 1.4*  --   PROT 5.7*  --   ALBUMIN 2.6* 2.5*   No results for input(s): LIPASE, AMYLASE in the last 168 hours. No results for input(s): AMMONIA in the last 168 hours. Coagulation Profile: Recent Labs  Lab  01/18/18 1049 01/19/18 0618 01/20/18 0255 01/20/18 1534 01/21/18 0557  INR 2.44 2.72 3.27 3.77 1.48   Cardiac Enzymes:  Radiology Studies: Reviewed images personally in health database   Scheduled Meds: . aspirin EC  81 mg Oral Daily  . atorvastatin  80 mg Oral Daily  . cholecalciferol  1,000 Units Oral Daily  . furosemide  60 mg Oral Daily  . hydrALAZINE  100 mg Oral Q8H  . insulin aspart  0-5 Units Subcutaneous QHS  . insulin aspart  0-9 Units Subcutaneous TID WC  . irbesartan  300 mg Oral Daily  . magnesium oxide   Oral Daily  . metoprolol tartrate  50 mg Oral BID  . pantoprazole  40 mg Oral Daily  . sodium chloride flush  3 mL Intravenous Q12H  . vitamin B-12  100 mcg Oral Daily  . warfarin  10 mg Oral ONCE-1800  . Warfarin - Pharmacist Dosing Inpatient   Does not apply q1800   Continuous Infusions: . sodium chloride 10 mL/hr at 01/20/18 1800  . heparin 1,300 Units/hr (01/22/18 1527)     LOS: 3 days    Time spent: [place], MD [facility]  If 7PM-7AM, please contact night-coverage [email or web address] Password [facility] 01/22/2018, 3:44 PM

## 2018-01-23 ENCOUNTER — Encounter (HOSPITAL_COMMUNITY): Payer: Self-pay | Admitting: Gastroenterology

## 2018-01-23 DIAGNOSIS — E1151 Type 2 diabetes mellitus with diabetic peripheral angiopathy without gangrene: Secondary | ICD-10-CM

## 2018-01-23 DIAGNOSIS — K8020 Calculus of gallbladder without cholecystitis without obstruction: Secondary | ICD-10-CM

## 2018-01-23 DIAGNOSIS — Z952 Presence of prosthetic heart valve: Secondary | ICD-10-CM

## 2018-01-23 DIAGNOSIS — I700 Atherosclerosis of aorta: Secondary | ICD-10-CM

## 2018-01-23 DIAGNOSIS — D649 Anemia, unspecified: Secondary | ICD-10-CM

## 2018-01-23 DIAGNOSIS — K8010 Calculus of gallbladder with chronic cholecystitis without obstruction: Secondary | ICD-10-CM

## 2018-01-23 DIAGNOSIS — I482 Chronic atrial fibrillation: Secondary | ICD-10-CM

## 2018-01-23 HISTORY — DX: Atherosclerosis of aorta: I70.0

## 2018-01-23 HISTORY — DX: Calculus of gallbladder without cholecystitis without obstruction: K80.20

## 2018-01-23 LAB — COMPREHENSIVE METABOLIC PANEL
ALBUMIN: 2.5 g/dL — AB (ref 3.5–5.0)
ALK PHOS: 70 U/L (ref 38–126)
ALT: 19 U/L (ref 14–54)
ANION GAP: 9 (ref 5–15)
AST: 33 U/L (ref 15–41)
BILIRUBIN TOTAL: 1 mg/dL (ref 0.3–1.2)
BUN: 12 mg/dL (ref 6–20)
CALCIUM: 8.2 mg/dL — AB (ref 8.9–10.3)
CO2: 21 mmol/L — AB (ref 22–32)
Chloride: 104 mmol/L (ref 101–111)
Creatinine, Ser: 0.81 mg/dL (ref 0.44–1.00)
GFR calc Af Amer: >60 mL/min (ref >60)
GFR calc non Af Amer: >60 mL/min (ref >60)
GLUCOSE: 163 mg/dL — AB (ref 65–99)
Potassium: 3.5 mmol/L (ref 3.5–5.1)
SODIUM: 134 mmol/L — AB (ref 135–145)
TOTAL PROTEIN: 5.6 g/dL — AB (ref 6.5–8.1)

## 2018-01-23 LAB — HEPARIN LEVEL (UNFRACTIONATED)
HEPARIN UNFRACTIONATED: 0.34 [IU]/mL (ref 0.30–0.70)
HEPARIN UNFRACTIONATED: 0.31 [IU]/mL (ref 0.30–0.70)

## 2018-01-23 LAB — CBC WITH DIFFERENTIAL/PLATELET
BASOS ABS: 0 10*3/uL (ref 0.0–0.1)
BASOS PCT: 1 %
EOS ABS: 0.1 10*3/uL (ref 0.0–0.7)
Eosinophils Relative: 2 %
HEMATOCRIT: 27 % — AB (ref 36.0–46.0)
HEMOGLOBIN: 8.4 g/dL — AB (ref 12.0–15.0)
Lymphocytes Relative: 21 %
Lymphs Abs: 1 10*3/uL (ref 0.7–4.0)
MCH: 24.4 pg — ABNORMAL LOW (ref 26.0–34.0)
MCHC: 31.1 g/dL (ref 30.0–36.0)
MCV: 78.5 fL (ref 78.0–100.0)
Monocytes Absolute: 0.3 10*3/uL (ref 0.1–1.0)
Monocytes Relative: 7 %
NEUTROS ABS: 3.2 10*3/uL (ref 1.7–7.7)
NEUTROS PCT: 69 %
Platelets: 170 10*3/uL (ref 150–400)
RBC: 3.44 MIL/uL — AB (ref 3.87–5.11)
RDW: 17.4 % — ABNORMAL HIGH (ref 11.5–15.5)
WBC: 4.6 10*3/uL (ref 4.0–10.5)

## 2018-01-23 LAB — GLUCOSE, CAPILLARY
GLUCOSE-CAPILLARY: 124 mg/dL — AB (ref 65–99)
GLUCOSE-CAPILLARY: 211 mg/dL — AB (ref 65–99)
Glucose-Capillary: 152 mg/dL — ABNORMAL HIGH (ref 65–99)

## 2018-01-23 LAB — PROTIME-INR
INR: 1.14
Prothrombin Time: 14.5 seconds (ref 11.4–15.2)

## 2018-01-23 MED ORDER — WARFARIN SODIUM 5 MG PO TABS
10.0000 mg | ORAL_TABLET | Freq: Once | ORAL | Status: AC
  Administered 2018-01-23: 10 mg via ORAL
  Filled 2018-01-23: qty 2

## 2018-01-23 NOTE — Plan of Care (Signed)
  Problem: Education: Goal: Knowledge of General Education information will improve Outcome: Progressing   Problem: Clinical Measurements: Goal: Will remain free from infection Outcome: Progressing Goal: Diagnostic test results will improve Outcome: Progressing   Problem: Bowel/Gastric: Goal: Will show no signs and symptoms of gastrointestinal bleeding Outcome: Progressing Continues heparin drip bridge to coumadin, no signs of GI bleeding at this time   Problem: Fluid Volume: Goal: Will show no signs and symptoms of excessive bleeding Outcome: Progressing

## 2018-01-23 NOTE — Progress Notes (Signed)
[place] for Heparin Indication: aritificial heart valve  Allergies  Allergen Reactions  . Coreg [Carvedilol] Other (See Comments)    Terrible cramping in the feet and had a lot of bowel movements, but not diarrhea  . Losartan Swelling    Patient doesn't recall site of swelling  . Spironolactone Nausea Only    Caused extreme nausea  . Verapamil Hives  . Zetia [Ezetimibe] Other (See Comments)    Reaction not recalled  . Zocor [Simvastatin - High Dose] Other (See Comments)    Reaction not recalled    Patient Measurements: Height: 5\' 1"  (154.9 cm) Weight: 150 lb (68 kg) IBW/kg (Calculated) : 47.8 Heparin Dosing Weight:  68 kg  Vital Signs: Temp: 98.9 F (37.2 C) (04/16 2022) Temp Source: Oral (04/16 2022) BP: 166/57 (04/16 2022) Pulse Rate: 64 (04/16 1452)  Labs: Recent Labs    01/20/18 0255 01/20/18 1534 01/21/18 0557  01/21/18 2259 01/22/18 0736 01/23/18 0012  HGB 8.8*  --  8.8*  --   --  8.6*  --   HCT 28.1*  --  28.3*  --   --  28.6*  --   PLT 159  --  187  --   --  184  --   LABPROT 33.1* 36.9* 17.8*  --   --   --   --   INR 3.27 3.77 1.48  --   --   --   --   HEPARINUNFRC  --   --   --    < > 0.12* 0.28* 0.34  CREATININE  --   --  0.73  --   --  0.64  --    < > = values in this interval not displayed.    Estimated Creatinine Clearance: 50.3 mL/min (by C-G formula based on SCr of 0.64 mg/dL).  Assessment: 77 y.o. female with h/o mechanical AVR, INR subtherapeutic, for heparin  Goal of Therapy:  Heparin level 0.3-0.7 units/ml  Monitor platelets by anticoagulation protocol: Yes   Plan:  Continue Heparin at current rate   Anibal Quinby, Bronson Curb 01/23/2018,1:11 AM

## 2018-01-23 NOTE — Care Management Important Message (Signed)
Important Message  Patient Details  Name: [name] MRN: [id number] Date of Birth: 18-Jan-1941   Medicare Important Message Given:  Yes    Persia Lintner Montine Circle 01/23/2018, 9:54 AM

## 2018-01-23 NOTE — Progress Notes (Signed)
ANTICOAGULATION CONSULT NOTE - Follow Up Consult  Pharmacy Consult for Heparin/Coumadin Indication: mechanical heart valve  Allergies  Allergen Reactions  . Coreg [Carvedilol] Other (See Comments)    Terrible cramping in the feet and had a lot of bowel movements, but not diarrhea  . Losartan Swelling    Patient doesn't recall site of swelling  . Spironolactone Nausea Only    Caused extreme nausea  . Verapamil Hives  . Zetia [Ezetimibe] Other (See Comments)    Reaction not recalled  . Zocor [Simvastatin - High Dose] Other (See Comments)    Reaction not recalled    Patient Measurements: Height: 5\' 1"  (154.9 cm) Weight: 150 lb (68 kg) IBW/kg (Calculated) : 47.8 Heparin Dosing Weight:    Vital Signs: Temp: 98.6 F (37 C) (04/17 0523) Temp Source: Oral (04/17 0523) BP: 152/51 (04/17 0523) Pulse Rate: 64 (04/17 0523)  Labs: Recent Labs    01/20/18 1534  01/21/18 0557  01/22/18 0736 01/23/18 0012 01/23/18 0405  HGB  --    < > 8.8*  --  8.6*  --  8.4*  HCT  --   --  28.3*  --  28.6*  --  27.0*  PLT  --   --  187  --  184  --  170  LABPROT 36.9*  --  17.8*  --   --   --  14.5  INR 3.77  --  1.48  --   --   --  1.14  HEPARINUNFRC  --   --   --    < > 0.28* 0.34 0.31  CREATININE  --   --  0.73  --  0.64  --  0.81   < > = values in this interval not displayed.    Estimated Creatinine Clearance: 49.7 mL/min (by C-G formula based on SCr of 0.81 mg/dL).  Assessment:  Anticoag: Warfarin PTA for St Jude valve AVR (goal 2.5 - 3.5) Needs endoscopy for GIB w/u, hold coumadin. heparin once INR < 2.5, s/p vitamin K 10mg  IM x 2 on 4/15, Heparin level 0.31 this AM. Hgb 8.4 stable. Plts 170 stable. INR 1.14 will be hard to overcome large doses of Vit K given. - 4/15: Vit K 10mg  IM x 2 doses - Home warfarin dose: 7.5 mg on MWF/ 5 mg on all other days, INR 2.44 on admission, last dose 4/12  Goal of Therapy:  Heparin level 0.3-0.7 units/ml  INR 2.5-3.5 Monitor platelets by  anticoagulation protocol: Yes   Plan:  IV heparin to continue at 1300 units/hr.  Monitor heparin level, CBC, daily INR and s/s of bleeding  Coumadin 10mg  po x 1 tonight.    Chastity Noland S. Alford Highland, PharmD, BCPS Clinical Staff Pharmacist Pager [phone]  Eilene Ghazi Stillinger 01/23/2018,8:49 AM

## 2018-01-23 NOTE — Progress Notes (Signed)
PROGRESS NOTE  [name]:[id number] DOB: July 08, 1941 DOA: 01/18/2018 PCP: [name], MD GI: Dr. Luan Pulling   Brief Narrative: 77 year old woman PMH coronary artery disease, CABG, mechanical aortic valve replacement, was sent to the emergency department by her GI physician for evaluation of anemia.  Admitted for further evaluation of anemia.  Coumadin held, started on heparin infusion, seen by GI.  Assessment/Plan Symptomatic anemia in the context of chronic warfarin and aspirin for mechanical aortic valve replacement.  EGD and colonoscopy were unrevealing.  GI suspects anemia secondary to mucosal bleeding from anticoagulation.  No further GI procedures planned.  Okay to resume warfarin per GI. --Hemoglobin stable.  Platelets within normal limits.  Follow clinically.  Mechanical AVR --INR 1.14.  Continue heparin bridge, warfarin.  Chronic atrial fibrillation. --Rate controlled.  Continue beta-blocker.  Diabetes mellitus type 2 with peripheral arterial disease --CBG stable.  Continue sliding scale insulin.  Essential hypertension.  Stable.  Continue Avapro, metoprolol.  CAD, s/p CABG x2   Possible chronic cholecystitis.  Reported nausea as an outpatient but this is currently resolved for at least a week the patient reports. --Follow-up as an outpatient with general surgery.  Aortic atherosclerosis. --Continue statin.   Appears stable.  Home when INR >2.4  DVT prophylaxis: Heparin infusion, warfarin Code Status: Full Family Communication: son at bedside Disposition Plan: home    Murray Hodgkins, MD  [facility] Direct contact: [phone] --[place]  --[email or web address]; password [email or web address]  7PM-7AM contact night coverage as above 01/23/2018, 12:54 PM  LOS: 4 days   Consultants:  Gastroenterology  Procedures:  4/16 EGD Impression:               - Normal esophagus.                           - Z-line regular, 40 cm from the incisors.                 - Erythematous mucosa in the antrum.                           - Normal examined duodenum.                           - No specimens collected.  4/16 colonoscopy Impression:               - Preparation of the colon was fair.                           - Diverticulosis in the sigmoid colon and in the                            descending colon.                           - Internal hemorrhoids.                           - No specimens collected. Recommendation:           - High fiber diet and cardiac diet.                           -  Repeat colonoscopy is not recommended for                            screening purposes.  Antimicrobials:    Interval history/Subjective: Feels okay.  Objective: Vitals:  Vitals:   01/22/18 2022 01/23/18 0523  BP: (!) 166/57 (!) 152/51  Pulse:  64  Resp:  17  Temp: 98.9 F (37.2 C) 98.6 F (37 C)  SpO2: 97% 95%    Exam:  Constitutional:  . Appears calm and comfortable Respiratory:  . CTA bilaterally, no w/r/r.  . Respiratory effort normal Cardiovascular:  . RRR, mechanical click noted.  No rub or gallop. . 1+ bilateral LE extremity edema   Psychiatric:  . Mental status o Mood, affect appropriate  I have personally reviewed the following:   Labs:  Basic metabolic panel unremarkable.  Hepatic function panel unremarkable.  Hemoglobin stable, 8.4.   Scheduled Meds: . aspirin EC  81 mg Oral Daily  . atorvastatin  80 mg Oral Daily  . cholecalciferol  1,000 Units Oral Daily  . furosemide  60 mg Oral Daily  . hydrALAZINE  100 mg Oral Q8H  . insulin aspart  0-5 Units Subcutaneous QHS  . insulin aspart  0-9 Units Subcutaneous TID WC  . irbesartan  300 mg Oral Daily  . magic mouthwash w/lidocaine  2 mL Oral TID  . magnesium oxide   Oral Daily  . metoprolol tartrate  50 mg Oral BID  . pantoprazole  40 mg Oral Daily  . sodium chloride flush  3 mL Intravenous Q12H  . vitamin B-12  100 mcg Oral Daily  . warfarin  10 mg  Oral ONCE-1800  . Warfarin - Pharmacist Dosing Inpatient   Does not apply q1800   Continuous Infusions: . sodium chloride 10 mL/hr at 01/20/18 1800  . heparin 1,300 Units/hr (01/23/18 0948)    Principal Problem:   Symptomatic anemia Active Problems:   Mechanical heart valve present   Type 2 diabetes mellitus (HCC)   Atrial fibrillation (HCC)   GI bleed   Cholelithiasis   Aortic atherosclerosis ([place])   LOS: 4 days

## 2018-01-23 NOTE — Anesthesia Postprocedure Evaluation (Signed)
Anesthesia Post Note  Patient: [name]  Procedure(s) Performed: COLONOSCOPY WITH PROPOFOL (N/A ) ESOPHAGOGASTRODUODENOSCOPY (EGD) WITH PROPOFOL (N/A )     Patient location during evaluation: PACU Anesthesia Type: MAC Level of consciousness: awake and alert Pain management: pain level controlled Vital Signs Assessment: post-procedure vital signs reviewed and stable Respiratory status: spontaneous breathing, nonlabored ventilation, respiratory function stable and patient connected to nasal cannula oxygen Cardiovascular status: stable and blood pressure returned to baseline Postop Assessment: no apparent nausea or vomiting Anesthetic complications: no    Last Vitals:  Vitals:   01/23/18 0523 01/23/18 1311  BP: (!) 152/51 (!) 135/50  Pulse: 64 (!) 51  Resp: 17 19  Temp: 37 C 37.1 C  SpO2: 95% 97%    Last Pain:  Vitals:   01/23/18 1020  TempSrc:   PainSc: 3    Pain Goal: Patients Stated Pain Goal: 3 (01/23/18 0845)               Lyndle Herrlich EDWARD

## 2018-01-24 LAB — CBC
HCT: 28.2 % — ABNORMAL LOW (ref 36.0–46.0)
HEMOGLOBIN: 8.4 g/dL — AB (ref 12.0–15.0)
MCH: 23.1 pg — ABNORMAL LOW (ref 26.0–34.0)
MCHC: 29.8 g/dL — ABNORMAL LOW (ref 30.0–36.0)
MCV: 77.7 fL — ABNORMAL LOW (ref 78.0–100.0)
PLATELETS: 154 10*3/uL (ref 150–400)
RBC: 3.63 MIL/uL — AB (ref 3.87–5.11)
RDW: 16.9 % — ABNORMAL HIGH (ref 11.5–15.5)
WBC: 4.2 10*3/uL (ref 4.0–10.5)

## 2018-01-24 LAB — HEPARIN LEVEL (UNFRACTIONATED): HEPARIN UNFRACTIONATED: 0.38 [IU]/mL (ref 0.30–0.70)

## 2018-01-24 LAB — PROTIME-INR
INR: 1.43
PROTHROMBIN TIME: 17.3 s — AB (ref 11.4–15.2)

## 2018-01-24 LAB — GLUCOSE, CAPILLARY
GLUCOSE-CAPILLARY: 176 mg/dL — AB (ref 65–99)
GLUCOSE-CAPILLARY: 156 mg/dL — AB (ref 65–99)
Glucose-Capillary: 160 mg/dL — ABNORMAL HIGH (ref 65–99)
Glucose-Capillary: 164 mg/dL — ABNORMAL HIGH (ref 65–99)
Glucose-Capillary: 128 mg/dL — ABNORMAL HIGH (ref 65–99)

## 2018-01-24 MED ORDER — WARFARIN SODIUM 7.5 MG PO TABS
7.5000 mg | ORAL_TABLET | Freq: Once | ORAL | Status: AC
  Administered 2018-01-24: 7.5 mg via ORAL
  Filled 2018-01-24: qty 1

## 2018-01-24 NOTE — Progress Notes (Signed)
ANTICOAGULATION CONSULT NOTE - Follow Up Consult  Pharmacy Consult for Heparin/Coumadin Indication: mechanical heart valve  Allergies  Allergen Reactions  . Coreg [Carvedilol] Other (See Comments)    Terrible cramping in the feet and had a lot of bowel movements, but not diarrhea  . Losartan Swelling    Patient doesn't recall site of swelling  . Spironolactone Nausea Only    Caused extreme nausea  . Verapamil Hives  . Zetia [Ezetimibe] Other (See Comments)    Reaction not recalled  . Zocor [Simvastatin - High Dose] Other (See Comments)    Reaction not recalled    Patient Measurements: Height: 5\' 1"  (154.9 cm) Weight: 150 lb (68 kg) IBW/kg (Calculated) : 47.8 Heparin Dosing Weight:    Vital Signs: Temp: 99.5 F (37.5 C) (04/18 0500) Temp Source: Oral (04/18 0500) BP: 168/54 (04/18 0500) Pulse Rate: 77 (04/18 0500)  Labs: Recent Labs    01/22/18 0736 01/23/18 0012 01/23/18 0405 01/24/18 0322  HGB 8.6*  --  8.4* 8.4*  HCT 28.6*  --  27.0* 28.2*  PLT 184  --  170 154  LABPROT  --   --  14.5 17.3*  INR  --   --  1.14 1.43  HEPARINUNFRC 0.28* 0.34 0.31 0.38  CREATININE 0.64  --  0.81  --     Estimated Creatinine Clearance: 49.7 mL/min (by C-G formula based on SCr of 0.81 mg/dL).  Assessment:  Anticoag: Warfarin PTA for St Jude valve AVR (goal 2.5 - 3.5) Needs endoscopy for GIB w/u, held coumadin. s/p vitamin K 10mg  IM x 2 on 4/15, Heparin level 0.38 this AM. Hgb 8.4 stable. Plts 154 stable. INR 1.14>1.43 - 4/15: Vit K 10mg  IM x 2 doses - Home warfarin dose: 7.5 mg on MWF/ 5 mg on all other days, INR 2.44 on admission, last dose 4/12  Goal of Therapy:  Heparin level 0.3-0.7 units/ml  INR 2.5-3.5 Monitor platelets by anticoagulation protocol: Yes   Plan:  IV heparin to continue at 1300 units/hr.  Monitor heparin level, CBC, daily INR and s/s of bleeding  Coumadin 7.5mg  po x 1 tonight.  Home on LMWH?  [name], PharmD, BCPS Clinical Staff  Pharmacist Pager [phone]  [name] 01/24/2018,10:59 AM

## 2018-01-24 NOTE — Plan of Care (Signed)
  Problem: Education: Goal: Knowledge of General Education information will improve Outcome: Progressing   Problem: Health Behavior/Discharge Planning: Goal: Ability to manage health-related needs will improve Outcome: Progressing   Problem: Clinical Measurements: Goal: Ability to maintain clinical measurements within normal limits will improve Outcome: Progressing Goal: Will remain free from infection Outcome: Progressing Goal: Diagnostic test results will improve Outcome: Progressing Goal: Respiratory complications will improve Outcome: Progressing Goal: Cardiovascular complication will be avoided Outcome: Progressing   Problem: Activity: Goal: Risk for activity intolerance will decrease Outcome: Progressing   Problem: Nutrition: Goal: Adequate nutrition will be maintained Outcome: Progressing   Problem: Coping: Goal: Level of anxiety will decrease Outcome: Progressing   Problem: Elimination: Goal: Will not experience complications related to bowel motility Outcome: Progressing Goal: Will not experience complications related to urinary retention Outcome: Progressing   Problem: Pain Managment: Goal: General experience of comfort will improve Outcome: Progressing   Problem: Education: Goal: Ability to identify signs and symptoms of gastrointestinal bleeding will improve Outcome: Progressing   Problem: Bowel/Gastric: Goal: Will show no signs and symptoms of gastrointestinal bleeding Outcome: Progressing   Problem: Fluid Volume: Goal: Will show no signs and symptoms of excessive bleeding Outcome: Progressing   Problem: Clinical Measurements: Goal: Complications related to the disease process, condition or treatment will be avoided or minimized Outcome: Progressing

## 2018-01-24 NOTE — Progress Notes (Signed)
PROGRESS NOTE  CARIN SHIPP OYD:[id number] DOB: [birth year] DOA: 01/18/2018 PCP: [name], MD GI: Dr. Luan Pulling   Brief Narrative: 77 year old woman PMH coronary artery disease, CABG, mechanical aortic valve replacement, was sent to the emergency department by her GI physician for evaluation of anemia.  Admitted for further evaluation of anemia.  Coumadin held, started on heparin infusion, seen by GI.  Assessment/Plan Symptomatic anemia in the context of chronic warfarin and aspirin for mechanical aortic valve replacement.  EGD and colonoscopy were unrevealing.  GI suspects anemia secondary to mucosal bleeding from anticoagulation.  No further GI procedures planned.  Okay to resume warfarin per GI. --Hemoglobin remained stable.  Follow clinically.  Mechanical AVR --INR 1.43.  Continue heparin bridge, warfarin until INR therapeutic.  Chronic atrial fibrillation. --Remains rate controlled.  Continue beta-blocker.  Discontinue telemetry.  Diabetes mellitus type 2 with peripheral arterial disease --Continue sliding scale insulin.  Blood sugars stable.  Essential hypertension.  Remains stable.  Continue Avapro, metoprolol.  CAD, s/p CABG x2   Possible chronic cholecystitis.  Reported nausea as an outpatient but this is currently resolved for at least a week the patient reports. --Follow-up as an outpatient with general surgery.  Aortic atherosclerosis. --Continue statin.   Appears stable.  Home when INR >2.4 remains the plan  DVT prophylaxis: Heparin infusion, warfarin Code Status: Full Family Communication: son at bedside Disposition Plan: home    Murray Hodgkins, MD  [facility] Direct contact: [phone] --Via [place]  --[email or web address]; password [email or web address]  7PM-7AM contact night coverage as above 01/24/2018, 2:29 PM  LOS: 5 days   Consultants:  Gastroenterology  Procedures:  4/16 EGD Impression:               - Normal esophagus.          - Z-line regular, 40 cm from the incisors.                           - Erythematous mucosa in the antrum.                           - Normal examined duodenum.                           - No specimens collected.  4/16 colonoscopy Impression:               - Preparation of the colon was fair.                           - Diverticulosis in the sigmoid colon and in the                            descending colon.                           - Internal hemorrhoids.                           - No specimens collected. Recommendation:           - High fiber diet and cardiac diet.                           -  Repeat colonoscopy is not recommended for                            screening purposes.  Antimicrobials:    Interval history/Subjective: Feels okay.  Objective: Vitals:  Vitals:   01/23/18 2157 01/24/18 0500  BP: (!) 157/56 (!) 168/54  Pulse: 81 77  Resp:  18  Temp: 99.6 F (37.6 C) 99.5 F (37.5 C)  SpO2: 96% 95%    Exam:  Constitutional:   . Appears calm and comfortable Respiratory:  . CTA bilaterally, no w/r/r.  . Respiratory effort normal Cardiovascular:  . RRR, mechanical click.  No rub or gallop. . 2+ bilateral lower extremity edema. Psychiatric:  . Mental status o Mood, affect appropriate  I have personally reviewed the following:   Labs:  Blood sugars stable.  Hemoglobin stable, 8.4.  Platelets 134.    INR 1.43.   Scheduled Meds: . aspirin EC  81 mg Oral Daily  . atorvastatin  80 mg Oral Daily  . cholecalciferol  1,000 Units Oral Daily  . furosemide  60 mg Oral Daily  . hydrALAZINE  100 mg Oral Q8H  . insulin aspart  0-5 Units Subcutaneous QHS  . insulin aspart  0-9 Units Subcutaneous TID WC  . irbesartan  300 mg Oral Daily  . magic mouthwash w/lidocaine  2 mL Oral TID  . magnesium oxide   Oral Daily  . metoprolol tartrate  50 mg Oral BID  . pantoprazole  40 mg Oral Daily  . sodium chloride flush  3 mL Intravenous Q12H  . vitamin  B-12  100 mcg Oral Daily  . warfarin  7.5 mg Oral ONCE-1800  . Warfarin - Pharmacist Dosing Inpatient   Does not apply q1800   Continuous Infusions: . sodium chloride 10 mL/hr at 01/20/18 1800  . heparin 1,300 Units/hr (01/24/18 1346)    Principal Problem:   Symptomatic anemia Active Problems:   Mechanical heart valve present   Type 2 diabetes mellitus (HCC)   Atrial fibrillation (HCC)   GI bleed   Cholelithiasis   Aortic atherosclerosis ([place])   LOS: 5 days

## 2018-01-25 DIAGNOSIS — M79603 Pain in arm, unspecified: Secondary | ICD-10-CM

## 2018-01-25 DIAGNOSIS — D696 Thrombocytopenia, unspecified: Secondary | ICD-10-CM

## 2018-01-25 DIAGNOSIS — R21 Rash and other nonspecific skin eruption: Secondary | ICD-10-CM

## 2018-01-25 LAB — CBC
HEMATOCRIT: 27.5 % — AB (ref 36.0–46.0)
HEMOGLOBIN: 8.2 g/dL — AB (ref 12.0–15.0)
MCH: 23 pg — ABNORMAL LOW (ref 26.0–34.0)
MCHC: 29.8 g/dL — ABNORMAL LOW (ref 30.0–36.0)
MCV: 77.2 fL — ABNORMAL LOW (ref 78.0–100.0)
Platelets: 133 10*3/uL — ABNORMAL LOW (ref 150–400)
RBC: 3.56 MIL/uL — ABNORMAL LOW (ref 3.87–5.11)
RDW: 16.9 % — ABNORMAL HIGH (ref 11.5–15.5)
WBC: 4 10*3/uL (ref 4.0–10.5)

## 2018-01-25 LAB — HEPARIN LEVEL (UNFRACTIONATED)
HEPARIN UNFRACTIONATED: 0.16 [IU]/mL — AB (ref 0.30–0.70)
Heparin Unfractionated: 0.49 IU/mL (ref 0.30–0.70)

## 2018-01-25 LAB — GLUCOSE, CAPILLARY
GLUCOSE-CAPILLARY: 186 mg/dL — AB (ref 65–99)
GLUCOSE-CAPILLARY: 97 mg/dL (ref 65–99)
Glucose-Capillary: 177 mg/dL — ABNORMAL HIGH (ref 65–99)
Glucose-Capillary: 202 mg/dL — ABNORMAL HIGH (ref 65–99)

## 2018-01-25 LAB — PROTIME-INR
INR: 1.75
PROTHROMBIN TIME: 20.3 s — AB (ref 11.4–15.2)

## 2018-01-25 MED ORDER — HYDROCORTISONE 1 % EX CREA
TOPICAL_CREAM | Freq: Two times a day (BID) | CUTANEOUS | Status: DC
  Administered 2018-01-25 – 2018-01-30 (×9): via TOPICAL
  Filled 2018-01-25 (×2): qty 28

## 2018-01-25 MED ORDER — FUROSEMIDE 40 MG PO TABS
60.0000 mg | ORAL_TABLET | Freq: Two times a day (BID) | ORAL | Status: DC
  Administered 2018-01-25: 60 mg via ORAL
  Filled 2018-01-25 (×2): qty 1

## 2018-01-25 MED ORDER — WARFARIN SODIUM 7.5 MG PO TABS
7.5000 mg | ORAL_TABLET | Freq: Once | ORAL | Status: AC
  Administered 2018-01-25: 7.5 mg via ORAL
  Filled 2018-01-25: qty 1

## 2018-01-25 MED ORDER — DIPHENHYDRAMINE HCL 25 MG PO CAPS
25.0000 mg | ORAL_CAPSULE | Freq: Three times a day (TID) | ORAL | Status: DC | PRN
  Administered 2018-01-25: 25 mg via ORAL
  Filled 2018-01-25: qty 1

## 2018-01-25 NOTE — Progress Notes (Addendum)
PROGRESS NOTE  TALISA PETRAK IZT:[id number] DOB: [birth year] DOA: 01/18/2018 PCP: [name], MD GI: Dr. Luan Pulling   Brief Narrative: 77 year old woman PMH coronary artery disease, CABG, mechanical aortic valve replacement, was sent to the emergency department by her GI physician for evaluation of anemia.  Admitted for further evaluation of anemia.  Coumadin held, started on heparin infusion, seen by GI.  Assessment/Plan Symptomatic anemia in the context of chronic warfarin and aspirin for mechanical aortic valve replacement.  EGD and colonoscopy were unrevealing.  GI suspects anemia secondary to mucosal bleeding from anticoagulation.  No further GI procedures planned.  Okay to resume warfarin per GI. --Hemoglobin remained stable.  No evidence of bleeding  Mechanical AVR --INR 1.75.  Continue heparin bridge, warfarin until INR therapeutic.  Thrombocytopenia. --Levels have varied in the past and been somewhat labile during this hospitalization.  We will follow closely while on heparin.  Check CBC in a.m.  Macular rash, back, right thigh. --Has the appearance of a contact type rash, possibly related to moisture or bad wash.  Will try hydrocortisone, Benadryl as needed.  Follow clinically.  I do not suspect drug reaction at this point.  Bilateral whole arm pain. --Etiology and significance unclear.  Movement intact bilaterally as is strength and tone.  Perfusion is excellent bilaterally and the skin appears normal.  Suspect related to arthritis, perhaps position in sleep.  Follow clinically  Small volume hemoptysis versus hematemesis. --Less than a penny size clot seen in cup collected by patient.  Unclear whether this may be related to epistaxis or suspected mucosal bleeding from anticoagulation as referenced above.  Significance unclear.  Hemoglobin remained stable.  Follow clinically at this point.  Chronic atrial fibrillation. --Stable.  Continue beta-blocker.    Diabetes  mellitus type 2 with peripheral arterial disease --Stable.  Continue sliding scale insulin.   Essential hypertension.  Elevated.  Continue Avapro, metoprolol.  Increase Lasix.  May need an additional agent.  CAD, s/p CABG x2   Possible chronic cholecystitis.  Reported nausea as an outpatient but this is currently resolved for at least a week the patient reports. --Follow-up as an outpatient with general surgery.  Aortic atherosclerosis. --Continue statin.   Appears stable.  Home when INR >2.4 remains the plan  DVT prophylaxis: Heparin infusion, warfarin Code Status: Full Family Communication: son at bedside Disposition Plan: home    Murray Hodgkins, MD  [facility] Direct contact: [phone] --Via [place]  --[email or web address]; password [email or web address]  7PM-7AM contact night coverage as above 01/25/2018, 9:21 AM  LOS: 6 days   Consultants:  Gastroenterology  Procedures:  4/16 EGD Impression:               - Normal esophagus.                           - Z-line regular, 40 cm from the incisors.                           - Erythematous mucosa in the antrum.                           - Normal examined duodenum.                           - No specimens collected.  4/16 colonoscopy Impression:               -  Preparation of the colon was fair.                           - Diverticulosis in the sigmoid colon and in the                            descending colon.                           - Internal hemorrhoids.                           - No specimens collected. Recommendation:           - High fiber diet and cardiac diet.                           - Repeat colonoscopy is not recommended for                            screening purposes.  Antimicrobials:    Interval history/Subjective: Multiple complaints today.  Reports pruritic rash on her back, right thigh that began sometime last evening.  Reports bilateral whole arm pain right greater than left that began last  night as well.  Has had similar pain in the past but not as severe.  Reports coughing up blood this morning.  Breathing okay.  No nausea or vomiting.  No epistaxis recently.  Objective: Vitals:  Vitals:   01/25/18 0509 01/25/18 0847  BP: (!) 164/65 (!) 170/60  Pulse: 82 96  Resp: 18   Temp:    SpO2: 95%     Exam:  Constitutional:   . Appears calm, mildly uncomfortable.  Sitting on the side of the bed. Eyes:  . pupils appear normal ENMT:  . grossly normal hearing  Respiratory:  . CTA bilaterally, no w/r/r.  . Respiratory effort normal Cardiovascular:  . RRR, no m/r/g . 2+ bilateral LE extremity edema   Musculoskeletal:  . Digits/nails BUE, bilateral feet: no clubbing, cyanosis, petechiae, infection . RUE, LUE o strength and tone normal, no atrophy, no abnormal movements . Diffusely tender from shoulder to hands bilaterally. Skin:  . Skin is diffusely tender over bilateral arms from shoulder to hands.  Reports pain when gripping with bilateral hands.  The skin itself of the arms appears fully unremarkable.  There is excellent perfusion in both hands.  Radial and ulnar pulses are 2+ bilaterally.  Perfusion of the fingers appears quite normal.  Skin examined with bedside nurse present.  There is no rash over the face, chest, neck, breasts, abdomen, arms, feet.  There is a macular rash over the upper back with perhaps one small area over the lower back, noncontiguous.  There is a small erythematous patchy rash over the right thigh. Psychiatric:  . Mental status o Mood, affect appropriate . judgement and insight appear intact  I have personally reviewed the following:   Labs:  Blood sugars remained stable.  Hemoglobin stable, 8.2.  Platelets slightly lower, 133.  WBC within normal limits.  INR 1.75   Scheduled Meds: . aspirin EC  81 mg Oral Daily  . atorvastatin  80 mg Oral Daily  . cholecalciferol  1,000 Units Oral Daily  . furosemide  60 mg Oral BID  .  hydrALAZINE  100 mg Oral Q8H  . hydrocortisone cream   Topical BID  . insulin aspart  0-5 Units Subcutaneous QHS  . insulin aspart  0-9 Units Subcutaneous TID WC  . irbesartan  300 mg Oral Daily  . magic mouthwash w/lidocaine  2 mL Oral TID  . magnesium oxide   Oral Daily  . metoprolol tartrate  50 mg Oral BID  . pantoprazole  40 mg Oral Daily  . sodium chloride flush  3 mL Intravenous Q12H  . vitamin B-12  100 mcg Oral Daily  . Warfarin - Pharmacist Dosing Inpatient   Does not apply q1800   Continuous Infusions: . sodium chloride 10 mL/hr at 01/20/18 1800  . heparin 1,300 Units/hr (01/25/18 0133)    Principal Problem:   Symptomatic anemia Active Problems:   Mechanical heart valve present   Type 2 diabetes mellitus (HCC)   Atrial fibrillation (HCC)   GI bleed   Cholelithiasis   Aortic atherosclerosis (HCC)   Macular rash   Thrombocytopenia (HCC)   Arm pain, diffuse   LOS: 6 days

## 2018-01-25 NOTE — Progress Notes (Signed)
ANTICOAGULATION CONSULT NOTE - Follow Up Consult  Pharmacy Consult for Heparin/Coumadin Indication: mechanical heart valve  Allergies  Allergen Reactions  . Coreg [Carvedilol] Other (See Comments)    Terrible cramping in the feet and had a lot of bowel movements, but not diarrhea  . Losartan Swelling    Patient doesn't recall site of swelling  . Spironolactone Nausea Only    Caused extreme nausea  . Verapamil Hives  . Zetia [Ezetimibe] Other (See Comments)    Reaction not recalled  . Zocor [Simvastatin - High Dose] Other (See Comments)    Reaction not recalled    Patient Measurements: Height: 5\' 1"  (154.9 cm) Weight: 150 lb (68 kg) IBW/kg (Calculated) : 47.8 Heparin Dosing Weight:    Vital Signs: Temp: 98.1 F (36.7 C) (04/19 0508) Temp Source: Oral (04/19 0508) BP: 170/60 (04/19 0847) Pulse Rate: 96 (04/19 0847)  Labs: Recent Labs    01/23/18 0405 01/24/18 0322 01/25/18 0447  HGB 8.4* 8.4* 8.2*  HCT 27.0* 28.2* 27.5*  PLT 170 154 133*  LABPROT 14.5 17.3* 20.3*  INR 1.14 1.43 1.75  HEPARINUNFRC 0.31 0.38 0.16*  CREATININE 0.81  --   --     Estimated Creatinine Clearance: 49.7 mL/min (by C-G formula based on SCr of 0.81 mg/dL).  Assessment:  Anticoag: Warfarin PTA for St Jude valve AVR (goal 2.5 - 3.5) Needs endoscopy for GIB w/u, held coumadin. s/p vitamin K 10mg  IM x 2 on 4/15, Heparin level 0.16 this AM, no issues with infusion per RN. Hgb 8.2 stable. Plts 133 stable. INR 1.14>1.43>1.75 - 4/15: Vit K 10mg  IM x 2 doses - Home warfarin dose: 7.5 mg on MWF/ 5 mg on all other days, INR 2.44 on admission, last dose 4/12  Goal of Therapy:  Heparin level 0.3-0.7 units/ml  INR 2.5-3.5 Monitor platelets by anticoagulation protocol: Yes   Plan:  IV heparin to increase to 1600 units/hr. Heparin level in  8hrs  Monitor heparin level, CBC, daily INR and s/s of bleeding  Coumadin 7.5mg  po x 1 tonight.  Home on LMWH?  Francina Beery A. Levada Dy, PharmD, [place] Pager: [phone]  01/25/2018,10:29 AM

## 2018-01-25 NOTE — Progress Notes (Signed)
MD was notified of patient having an allergic reaction on her skin. She has hives on her back and legs.

## 2018-01-25 NOTE — Progress Notes (Signed)
ANTICOAGULATION CONSULT NOTE - Follow Up Consult  Pharmacy Consult for Heparin/Coumadin Indication: mechanical heart valve  Allergies  Allergen Reactions  . Coreg [Carvedilol] Other (See Comments)    Terrible cramping in the feet and had a lot of bowel movements, but not diarrhea  . Losartan Swelling    Patient doesn't recall site of swelling  . Spironolactone Nausea Only    Caused extreme nausea  . Verapamil Hives  . Zetia [Ezetimibe] Other (See Comments)    Reaction not recalled  . Zocor [Simvastatin - High Dose] Other (See Comments)    Reaction not recalled    Patient Measurements: Height: 5\' 1"  (154.9 cm) Weight: 150 lb (68 kg) IBW/kg (Calculated) : 47.8 Heparin Dosing Weight:    Vital Signs: Temp: 98.7 F (37.1 C) (04/19 1437) BP: 158/50 (04/19 1437) Pulse Rate: 64 (04/19 1437)  Labs: Recent Labs    01/23/18 0405 01/24/18 0322 01/25/18 0447 01/25/18 1851  HGB 8.4* 8.4* 8.2*  --   HCT 27.0* 28.2* 27.5*  --   PLT 170 154 133*  --   LABPROT 14.5 17.3* 20.3*  --   INR 1.14 1.43 1.75  --   HEPARINUNFRC 0.31 0.38 0.16* 0.49  CREATININE 0.81  --   --   --     Estimated Creatinine Clearance: 49.7 mL/min (by C-G formula based on SCr of 0.81 mg/dL).  Assessment:  Anticoag: Warfarin PTA for St Jude valve AVR (goal 2.5 - 3.5) Needs endoscopy for GIB w/u, held coumadin. s/p vitamin K 10mg  IM x 2 on 4/15, Heparin level 0.16 this AM, no issues with infusion per RN. Hgb 8.2 stable. Plts 133 stable. INR 1.14>1.43>1.75 - 4/15: Vit K 10mg  IM x 2 doses - Home warfarin dose: 7.5 mg on MWF/ 5 mg on all other days, INR 2.44 on admission, last dose 4/12  PM heparin level therapeutic  Goal of Therapy:  Heparin level 0.3-0.7 units/ml  INR 2.5-3.5 Monitor platelets by anticoagulation protocol: Yes   Plan:  Continue heparin at 1600 units / hr Follow up AM labs  Thank you Anette Guarneri, PharmD [phone] 01/25/2018,8:07 PM

## 2018-01-26 ENCOUNTER — Inpatient Hospital Stay (HOSPITAL_COMMUNITY): Payer: Medicare HMO

## 2018-01-26 DIAGNOSIS — E871 Hypo-osmolality and hyponatremia: Secondary | ICD-10-CM

## 2018-01-26 DIAGNOSIS — R1010 Upper abdominal pain, unspecified: Secondary | ICD-10-CM

## 2018-01-26 LAB — CBC
HCT: 28.4 % — ABNORMAL LOW (ref 36.0–46.0)
Hemoglobin: 8.6 g/dL — ABNORMAL LOW (ref 12.0–15.0)
MCH: 23.2 pg — ABNORMAL LOW (ref 26.0–34.0)
MCHC: 30.3 g/dL (ref 30.0–36.0)
MCV: 76.5 fL — ABNORMAL LOW (ref 78.0–100.0)
PLATELETS: 174 10*3/uL (ref 150–400)
RBC: 3.71 MIL/uL — AB (ref 3.87–5.11)
RDW: 17 % — ABNORMAL HIGH (ref 11.5–15.5)
WBC: 4.2 10*3/uL (ref 4.0–10.5)

## 2018-01-26 LAB — COMPREHENSIVE METABOLIC PANEL
ALK PHOS: 71 U/L (ref 38–126)
ALT: 18 U/L (ref 14–54)
AST: 29 U/L (ref 15–41)
Albumin: 2.4 g/dL — ABNORMAL LOW (ref 3.5–5.0)
Anion gap: 9 (ref 5–15)
BUN: 10 mg/dL (ref 6–20)
CO2: 21 mmol/L — ABNORMAL LOW (ref 22–32)
CREATININE: 0.75 mg/dL (ref 0.44–1.00)
Calcium: 8.1 mg/dL — ABNORMAL LOW (ref 8.9–10.3)
Chloride: 98 mmol/L — ABNORMAL LOW (ref 101–111)
GFR calc Af Amer: >60 mL/min (ref >60)
Glucose, Bld: 135 mg/dL — ABNORMAL HIGH (ref 65–99)
POTASSIUM: 3.4 mmol/L — AB (ref 3.5–5.1)
Sodium: 128 mmol/L — ABNORMAL LOW (ref 135–145)
TOTAL PROTEIN: 6.2 g/dL — AB (ref 6.5–8.1)
Total Bilirubin: 1 mg/dL (ref 0.3–1.2)

## 2018-01-26 LAB — HEPARIN LEVEL (UNFRACTIONATED): HEPARIN UNFRACTIONATED: 0.43 [IU]/mL (ref 0.30–0.70)

## 2018-01-26 LAB — LIPASE, BLOOD: LIPASE: 23 U/L (ref 11–51)

## 2018-01-26 LAB — GLUCOSE, CAPILLARY
Glucose-Capillary: 142 mg/dL — ABNORMAL HIGH (ref 65–99)
Glucose-Capillary: 159 mg/dL — ABNORMAL HIGH (ref 65–99)
Glucose-Capillary: 136 mg/dL — ABNORMAL HIGH (ref 65–99)
Glucose-Capillary: 130 mg/dL — ABNORMAL HIGH (ref 65–99)

## 2018-01-26 LAB — PROTIME-INR
INR: 2.2
Prothrombin Time: 24.3 seconds — ABNORMAL HIGH (ref 11.4–15.2)

## 2018-01-26 MED ORDER — WARFARIN SODIUM 5 MG PO TABS
5.0000 mg | ORAL_TABLET | Freq: Once | ORAL | Status: AC
  Administered 2018-01-26: 5 mg via ORAL
  Filled 2018-01-26: qty 1

## 2018-01-26 MED ORDER — POTASSIUM CHLORIDE CRYS ER 20 MEQ PO TBCR
40.0000 meq | EXTENDED_RELEASE_TABLET | ORAL | Status: AC
  Administered 2018-01-26 (×2): 40 meq via ORAL
  Filled 2018-01-26 (×2): qty 2

## 2018-01-26 MED ORDER — FUROSEMIDE 10 MG/ML IJ SOLN
40.0000 mg | Freq: Four times a day (QID) | INTRAMUSCULAR | Status: AC
  Administered 2018-01-26 (×2): 40 mg via INTRAVENOUS
  Filled 2018-01-26 (×2): qty 4

## 2018-01-26 NOTE — Progress Notes (Signed)
ANTICOAGULATION CONSULT NOTE - Follow Up Consult  Pharmacy Consult for Heparin/Coumadin Indication: mechanical heart valve  Allergies  Allergen Reactions  . Coreg [Carvedilol] Other (See Comments)    Terrible cramping in the feet and had a lot of bowel movements, but not diarrhea  . Losartan Swelling    Patient doesn't recall site of swelling  . Spironolactone Nausea Only    Caused extreme nausea  . Verapamil Hives  . Zetia [Ezetimibe] Other (See Comments)    Reaction not recalled  . Zocor [Simvastatin - High Dose] Other (See Comments)    Reaction not recalled    Patient Measurements: Height: 5\' 1"  (154.9 cm) Weight: 150 lb (68 kg) IBW/kg (Calculated) : 47.8 Heparin Dosing Weight:    Vital Signs: Temp: 98.2 F (36.8 C) (04/20 0525) Temp Source: Oral (04/20 0525) BP: 116/49 (04/20 0525) Pulse Rate: 85 (04/20 0525)  Labs: Recent Labs    01/24/18 0322 01/25/18 0447 01/25/18 1851 01/26/18 0719 01/26/18 0728  HGB 8.4* 8.2*  --  8.6*  --   HCT 28.2* 27.5*  --  28.4*  --   PLT 154 133*  --  174  --   LABPROT 17.3* 20.3*  --  24.3*  --   INR 1.43 1.75  --  2.20  --   HEPARINUNFRC 0.38 0.16* 0.49 0.43  --   CREATININE  --   --   --   --  0.75    Estimated Creatinine Clearance: 50.3 mL/min (by C-G formula based on SCr of 0.75 mg/dL).  Assessment:  Anticoag: Warfarin PTA for St Jude valve AVR (goal 2.5 - 3.5) Needs endoscopy for GIB w/u, held coumadin. s/p vitamin K 10mg  IM x 2 on 4/15, Heparin level 0.16 this AM, no issues with infusion per RN. Hgb 8.2 stable. Plts 133 stable. INR 1.14>1.43>1.75>2.20 - 4/15: Vit K 10mg  IM x 2 doses - Home warfarin dose: 7.5 mg on MWF/ 5 mg on all other days, INR 2.44 on admission, last dose 4/12  AM heparin level therapeutic at 0.43  Goal of Therapy:  Heparin level 0.3-0.7 units/ml  INR 2.5-3.5 Monitor platelets by anticoagulation protocol: Yes   Plan:  Continue heparin at 1600 units / hr Warfarin 5mg  x 1 per home dose  today Follow up AM labs  Leianna Barga A. Levada Dy, PharmD, [place] Pager: [phone]  01/26/2018,10:48 AM

## 2018-01-26 NOTE — Progress Notes (Signed)
Informed Dr. Sarajane Jews that patient Is complaining of 9/10 pain in LUQ with new onset cough. MD to come see patient.

## 2018-01-26 NOTE — Progress Notes (Signed)
PROGRESS NOTE  [name] BJS:[id number] DOB: 12-Nov-1940 DOA: 01/18/2018 PCP: [name], MD GI: Dr. Luan Pulling   Brief Narrative: 77 year old woman PMH coronary artery disease, CABG, mechanical aortic valve replacement, was sent to the emergency department by her GI physician for evaluation of anemia.  Admitted for further evaluation of anemia.  Coumadin held, started on heparin infusion, seen by GI.  Assessment/Plan Symptomatic anemia in the context of chronic warfarin and aspirin for mechanical aortic valve replacement.  EGD and colonoscopy were unrevealing.  GI suspects anemia secondary to mucosal bleeding from anticoagulation.  No further GI procedures planned.  Okay to resume warfarin per GI. --Hemoglobin remains stable.  INR 2.2 today.  Continue heparin until INR therapeutic.  Mechanical AVR --INR 2.2.  Continue heparin bridge, warfarin until INR greater than 2.4  Upper abdominal pain, predominantly ribs. --May be secondary to cough.  Abdominal exam is benign.  I doubt any acute abdominal process.  LFTs and lipase unremarkable.  Follow clinically.  Will check abdominal film.  Hyponatremia, hypokalemia.  Modest.  Follow clinically. --BMP in a.m.  Replace potassium.  Thrombocytopenia. --Resolved.  Macular rash, back, right thigh. --Resolving.  Likely related to moisture.  Bilateral whole arm pain. --Improving dramatically.  Exam benign.  Follow clinically.  Suspect arthritis.  Small volume hemoptysis --Very low volume, may be related to previous mucosal bleeding, exacerbated by cough.  Hemoglobin remained stable.  Will follow clinically.  Expect self resolution.  Chronic atrial fibrillation. --Stable.  Continue beta-blocker.    Diabetes mellitus type 2 with peripheral arterial disease --Stable.  Continue sliding scale insulin.   Essential hypertension.  Stable.  Continue Avapro, metoprolol.   CAD, s/p CABG x2   Possible chronic cholecystitis.  Reported  nausea as an outpatient but this is currently resolved for at least a week the patient reports. --Follow-up as an outpatient with general surgery.  Her abdominal exam is nonspecific.  I do not suspect acute cholecystitis at this point.  Aortic atherosclerosis. --Continue statin.   Follow clinically, anticipate discharge next 48 hours when INR therapeutic, pending resolution of complaints documented above.  DVT prophylaxis: Heparin infusion, warfarin Code Status: Full Family Communication: None Disposition Plan: home    Murray Hodgkins, MD  [facility] Direct contact: [phone] --Via amion app OR  --[email or web address]; password [email or web address]  7PM-7AM contact night coverage as above 01/26/2018, 8:51 AM  LOS: 7 days   Consultants:  Gastroenterology  Procedures:  4/16 EGD Impression:               - Normal esophagus.                           - Z-line regular, 40 cm from the incisors.                           - Erythematous mucosa in the antrum.                           - Normal examined duodenum.                           - No specimens collected.  4/16 colonoscopy Impression:               - Preparation of the colon was fair.                           -  Diverticulosis in the sigmoid colon and in the                            descending colon.                           - Internal hemorrhoids.                           - No specimens collected. Recommendation:           - High fiber diet and cardiac diet.                           - Repeat colonoscopy is not recommended for                            screening purposes.  Antimicrobials:    Interval history/Subjective: Patient was seen last evening at which time she was feeling better and her rash has improved.  This a.m. she reported left upper quadrant pain to nurse.  Patient reports coughing developed last night.  She feels short of breath.  She reports pain "everywhere".  On further discussion, she locates pain to  the ribs left upper quadrant and right upper quadrant.  No epigastric pain.  She is hungry and is waiting for breakfast.  "I never get enough food to eat".  She has been coughing up small amount of blood.  Her rash is not bothering her.  Arm pain in the right arm has much improved.  Still has some more soreness in the upper left arm.  Objective: Vitals:  Vitals:   01/25/18 2246 01/26/18 0525  BP: (!) 158/52 (!) 116/49  Pulse: 84 85  Resp: 18 18  Temp: 98.2 F (36.8 C) 98.2 F (36.8 C)  SpO2: 94% 94%    Exam:  Constitutional:   . Appears anxious, mildly uncomfortable, however there are periods of relaxation during examination and interview. Eyes:  . pupils and irises appear normal . Normal lids  ENMT:  . grossly normal hearing  . Lips appear normal Respiratory:  . Some rhonchi and some wheezes all lung fields.  No rales noted. . Mild increased respiratory effort.  Able to speak in full sentences.  Seems to relax at times. Cardiovascular:  . RRR, no m/r/g . 2+ bilateral LE extremity edema, less than yesterday Abdomen:  . Appears normal.  Soft, nontender, nondistended at times, at other times exquisitely tender, more so over the left lower ribs and right lower ribs.  No epigastric pain. Musculoskeletal:  . Palpation of the right arm reveals minimal pain in the upper arm.  The arm appears unremarkable.  There is no rash.  She is got excellent grip strength on the right.  Radial pulse 2+.  Perfusion appears normal.  Her range of motion is much improved compared to yesterday.  The left arm also appears unremarkable.  There appears to be normal perfusion with radial pulse 2+.  She has some pain with palpation of the upper arm. Skin:  . The rash over the back and right thigh is fading.  Some macular rash. Psychiatric:  . Mental status o Mood difficult to assess.  Anxious affect.  I have personally reviewed the following:   Labs:  Sodium 128, chloride 98  BUN  and creatinine  within normal limits  LFTs unremarkable.  Lipase within normal limits.  Hemoglobin improved, 8.6.  Platelet count improved, 174.   Scheduled Meds: . aspirin EC  81 mg Oral Daily  . atorvastatin  80 mg Oral Daily  . cholecalciferol  1,000 Units Oral Daily  . furosemide  60 mg Oral BID  . hydrALAZINE  100 mg Oral Q8H  . hydrocortisone cream   Topical BID  . insulin aspart  0-5 Units Subcutaneous QHS  . insulin aspart  0-9 Units Subcutaneous TID WC  . irbesartan  300 mg Oral Daily  . magic mouthwash w/lidocaine  2 mL Oral TID  . magnesium oxide   Oral Daily  . metoprolol tartrate  50 mg Oral BID  . pantoprazole  40 mg Oral Daily  . sodium chloride flush  3 mL Intravenous Q12H  . vitamin B-12  100 mcg Oral Daily  . Warfarin - Pharmacist Dosing Inpatient   Does not apply q1800   Continuous Infusions: . sodium chloride 10 mL/hr at 01/20/18 1800  . heparin 1,600 Units/hr (01/25/18 1800)    Principal Problem:   Symptomatic anemia Active Problems:   Mechanical heart valve present   Type 2 diabetes mellitus (HCC)   Atrial fibrillation (HCC)   GI bleed   Cholelithiasis   Aortic atherosclerosis (HCC)   Macular rash   Thrombocytopenia (HCC)   Arm pain, diffuse   LOS: 7 days

## 2018-01-27 LAB — CBC
HEMATOCRIT: 25.5 % — AB (ref 36.0–46.0)
HEMOGLOBIN: 8 g/dL — AB (ref 12.0–15.0)
MCH: 24 pg — AB (ref 26.0–34.0)
MCHC: 31.4 g/dL (ref 30.0–36.0)
MCV: 76.6 fL — AB (ref 78.0–100.0)
Platelets: 205 10*3/uL (ref 150–400)
RBC: 3.33 MIL/uL — AB (ref 3.87–5.11)
RDW: 17.4 % — ABNORMAL HIGH (ref 11.5–15.5)
WBC: 3.8 10*3/uL — ABNORMAL LOW (ref 4.0–10.5)

## 2018-01-27 LAB — GLUCOSE, CAPILLARY
GLUCOSE-CAPILLARY: 155 mg/dL — AB (ref 65–99)
GLUCOSE-CAPILLARY: 164 mg/dL — AB (ref 65–99)
GLUCOSE-CAPILLARY: 159 mg/dL — AB (ref 65–99)
Glucose-Capillary: 154 mg/dL — ABNORMAL HIGH (ref 65–99)

## 2018-01-27 LAB — PROTIME-INR
INR: 2.65
Prothrombin Time: 28.1 seconds — ABNORMAL HIGH (ref 11.4–15.2)

## 2018-01-27 LAB — HEPARIN LEVEL (UNFRACTIONATED)
HEPARIN UNFRACTIONATED: 0.39 [IU]/mL (ref 0.30–0.70)
Heparin Unfractionated: 0.29 IU/mL — ABNORMAL LOW (ref 0.30–0.70)

## 2018-01-27 MED ORDER — POTASSIUM CHLORIDE CRYS ER 20 MEQ PO TBCR
20.0000 meq | EXTENDED_RELEASE_TABLET | Freq: Every day | ORAL | Status: DC
  Administered 2018-01-28 – 2018-01-30 (×3): 20 meq via ORAL
  Filled 2018-01-27 (×3): qty 1

## 2018-01-27 MED ORDER — WARFARIN SODIUM 5 MG PO TABS
5.0000 mg | ORAL_TABLET | Freq: Once | ORAL | Status: AC
  Administered 2018-01-27: 5 mg via ORAL
  Filled 2018-01-27: qty 1

## 2018-01-27 MED ORDER — FUROSEMIDE 10 MG/ML IJ SOLN
40.0000 mg | Freq: Once | INTRAMUSCULAR | Status: AC
  Administered 2018-01-27: 40 mg via INTRAVENOUS
  Filled 2018-01-27: qty 4

## 2018-01-27 MED ORDER — POTASSIUM CHLORIDE CRYS ER 20 MEQ PO TBCR
40.0000 meq | EXTENDED_RELEASE_TABLET | Freq: Once | ORAL | Status: AC
  Administered 2018-01-27: 40 meq via ORAL
  Filled 2018-01-27: qty 2

## 2018-01-27 MED ORDER — FUROSEMIDE 10 MG/ML IJ SOLN
20.0000 mg | Freq: Every day | INTRAMUSCULAR | Status: DC
  Administered 2018-01-28: 20 mg via INTRAVENOUS
  Filled 2018-01-27: qty 2

## 2018-01-27 NOTE — Progress Notes (Signed)
PROGRESS NOTE    [name]  RFF:[id number] DOB: 1940/12/28 DOA: 01/18/2018 PCP: [name], MD  Brief Narrative:77 year old woman PMH coronary artery disease, CABG, mechanical aortic valve replacement, was sent to the emergency department by her GI physician for evaluation of anemia.  Admitted for further evaluation of anemia.  Coumadin held, started on heparin infusion, seen by GI.    Assessment & Plan:   Principal Problem:   Symptomatic anemia Active Problems:   Mechanical heart valve present   Type 2 diabetes mellitus (HCC)   Atrial fibrillation (HCC)   GI bleed   Cholelithiasis   Aortic atherosclerosis (HCC)   Macular rash   Thrombocytopenia (HCC)   Arm pain, diffuse  Symptomatic anemia in the context of chronic warfarin and aspirin for mechanical aortic valve replacement.  EGD and colonoscopy were unrevealing.  GI suspects anemia secondary to mucosal bleeding from anticoagulation.  No further GI procedures planned.  Okay to resume warfarin per GI. --Hemoglobin remains stable.  INR 2.65 today.  Continue heparin until INR therapeutic.  Mechanical AVR --INR 2.65.  Continue heparin bridge, warfarin until INR greater than 2.4. -Chest x-ray done yesterday shows pleural effusion and pulmonary edema.  Patient takes Lasix 60 mg daily at home.  Her ejection fraction is 60%.  Will restart Lasix and reassess tomorrow.  Upper abdominal pain, predominantly ribs. --May be secondary to cough.  Abdominal exam is benign.  I doubt any acute abdominal process.  LFTs and lipase unremarkable.  Follow clinically. kub un remarkable.  Hyponatremia, hypokalemia.  Modest.  Follow clinically. --BMP in a.m.   Thrombocytopenia. --Resolved.  Macular rash, back, right thigh. --Resolving.  Likely related to moisture.  Bilateral whole arm pain. --Improving dramatically.  Exam benign.  Follow clinically.  Suspect arthritis.  Small volume hemoptysis --Very low volume, may be  related to previous mucosal bleeding, exacerbated by cough.  Hemoglobin remained stable.  Will follow clinically.  Expect self resolution.  Chronic atrial fibrillation. --Stable.  Continue beta-blocker.    Diabetes mellitus type 2 with peripheral arterial disease --Stable.  Continue sliding scale insulin.   Essential hypertension.  Stable.  Continue Avapro, metoprolol.   CAD, s/p CABG x2   Possible chronic cholecystitis.  Reported nausea as an outpatient but this is currently resolved for at least a week the patient reports. --Follow-up as an outpatient with general surgery.  Her abdominal exam is nonspecific.  I do not suspect acute cholecystitis at this point.  Aortic atherosclerosis. --Continue statin    DVT prophylaxis: Heparin and Coumadin Code Status full code Family Communication: No family available Disposition Plan: Hopefully discharge in the next 1-2 days.  Will obtain PT evaluation in preparation for discharge.  Consultants:  GI Procedures: EGD and colonoscopy Antimicrobials: None  Subjective: Patient complaining of shortness of breath and right-sided pleuritic chest pain which started a few days ago.   Objective: Resting in bed in no acute distress she is able to speak in full sentences. Vitals:   01/26/18 0525 01/26/18 1425 01/26/18 2255 01/27/18 0500  BP: (!) 116/49 (!) 132/57 (!) 142/55 129/73  Pulse: 85 81 82 93  Resp: 18 20 18 18   Temp: 98.2 F (36.8 C) 98.9 F (37.2 C) 97.7 F (36.5 C) 98.3 F (36.8 C)  TempSrc: Oral Oral Oral Oral  SpO2: 94% 93% 93% 94%  Weight:      Height:        Intake/Output Summary (Last 24 hours) at 01/27/2018 1239 Last data filed at 01/27/2018 0900  Gross per 24 hour  Intake 576.13 ml  Output 950 ml  Net -373.87 ml   Filed Weights   01/22/18 1226  Weight: 68 kg (150 lb)    Examination:  General exam: Appears calm and comfortable  Respiratory system: decresed breath sounds at bases  to auscultation.  Respiratory effort normal. Cardiovascular system: S1 & S2 heard, RRR. No JVD, murmurs, rubs, gallops or clicks. No pedal edema. Gastrointestinal system: Abdomen is nondistended, soft and nontender. No organomegaly or masses felt. Normal bowel sounds heard. Central nervous system: Alert and oriented. No focal neurological deficits. Extremities: Symmetric 5 x 5 power. Skin: No rashes, lesions or ulcers Psychiatry: Judgement and insight appear normal. Mood & affect appropriate.     Data Reviewed: I have personally reviewed following labs and imaging studies  CBC: Recent Labs  Lab 01/23/18 0405 01/24/18 0322 01/25/18 0447 01/26/18 0719 01/27/18 0518  WBC 4.6 4.2 4.0 4.2 3.8*  NEUTROABS 3.2  --   --   --   --   HGB 8.4* 8.4* 8.2* 8.6* 8.0*  HCT 27.0* 28.2* 27.5* 28.4* 25.5*  MCV 78.5 77.7* 77.2* 76.5* 76.6*  PLT 170 154 133* 174 854   Basic Metabolic Panel: Recent Labs  Lab 01/21/18 0557 01/22/18 0736 01/23/18 0405 01/26/18 0728  NA 134* 135 134* 128*  K 3.6 3.6 3.5 3.4*  CL 103 107 104 98*  CO2 23 20* 21* 21*  GLUCOSE 141* 111* 163* 135*  BUN 11 10 12 10   CREATININE 0.73 0.64 0.81 0.75  CALCIUM 8.2* 8.2* 8.2* 8.1*  PHOS  --  3.3  --   --    GFR: Estimated Creatinine Clearance: 50.3 mL/min (by C-G formula based on SCr of 0.75 mg/dL). Liver Function Tests: Recent Labs  Lab 01/22/18 0736 01/23/18 0405 01/26/18 0728  AST  --  33 29  ALT  --  19 18  ALKPHOS  --  70 71  BILITOT  --  1.0 1.0  PROT  --  5.6* 6.2*  ALBUMIN 2.5* 2.5* 2.4*   Recent Labs  Lab 01/26/18 0728  LIPASE 23   No results for input(s): AMMONIA in the last 168 hours. Coagulation Profile: Recent Labs  Lab 01/23/18 0405 01/24/18 0322 01/25/18 0447 01/26/18 0719 01/27/18 0518  INR 1.14 1.43 1.75 2.20 2.65   Cardiac Enzymes: No results for input(s): CKTOTAL, CKMB, CKMBINDEX, TROPONINI in the last 168 hours. BNP (last 3 results) No results for input(s): PROBNP in the last 8760  hours. HbA1C: No results for input(s): HGBA1C in the last 72 hours. CBG: Recent Labs  Lab 01/26/18 0752 01/26/18 1144 01/26/18 1632 01/26/18 2252 01/27/18 0748  GLUCAP 142* 159* 136* 130* 155*   Lipid Profile: No results for input(s): CHOL, HDL, LDLCALC, TRIG, CHOLHDL, LDLDIRECT in the last 72 hours. Thyroid Function Tests: No results for input(s): TSH, T4TOTAL, FREET4, T3FREE, THYROIDAB in the last 72 hours. Anemia Panel: No results for input(s): VITAMINB12, FOLATE, FERRITIN, TIBC, IRON, RETICCTPCT in the last 72 hours. Sepsis Labs: No results for input(s): PROCALCITON, LATICACIDVEN in the last 168 hours.  No results found for this or any previous visit (from the past 240 hour(s)).       Radiology Studies: Dg Chest 2 View  Result Date: 01/26/2018 CLINICAL DATA:  Shortness of breath and abdominal pain for 1 day. EXAM: CHEST - 2 VIEW COMPARISON:  PA and lateral chest 02/23/2017. FINDINGS: There is cardiomegaly and mild interstitial edema. Small bilateral pleural effusions are seen, larger on the left.  There is some basilar airspace disease bilaterally. No pneumothorax. No acute bony abnormality. IMPRESSION: Cardiomegaly with mild interstitial edema and small effusions, larger on the left. Bibasilar airspace disease could be due to more intense edema, atelectasis or less likely pneumonia. Electronically Signed   By: Inge Rise M.D.   On: 01/26/2018 12:04   Dg Abd 2 Views  Result Date: 01/26/2018 CLINICAL DATA:  Abdominal pain for 1 day. EXAM: ABDOMEN - 2 VIEW COMPARISON:  CT abdomen 01/18/2018. FINDINGS: No free intraperitoneal air is identified. The bowel gas pattern is nonobstructive. Convex left scoliosis and advanced right hip osteoarthritis are noted. IMPRESSION: No acute abnormality. Electronically Signed   By: Inge Rise M.D.   On: 01/26/2018 12:07        Scheduled Meds: . aspirin EC  81 mg Oral Daily  . atorvastatin  80 mg Oral Daily  . cholecalciferol   1,000 Units Oral Daily  . [START ON 01/28/2018] furosemide  20 mg Intravenous Daily  . hydrALAZINE  100 mg Oral Q8H  . hydrocortisone cream   Topical BID  . insulin aspart  0-5 Units Subcutaneous QHS  . insulin aspart  0-9 Units Subcutaneous TID WC  . irbesartan  300 mg Oral Daily  . magic mouthwash w/lidocaine  2 mL Oral TID  . magnesium oxide   Oral Daily  . metoprolol tartrate  50 mg Oral BID  . pantoprazole  40 mg Oral Daily  . [START ON 01/28/2018] potassium chloride  20 mEq Oral Daily  . sodium chloride flush  3 mL Intravenous Q12H  . vitamin B-12  100 mcg Oral Daily  . warfarin  5 mg Oral ONCE-1800  . Warfarin - Pharmacist Dosing Inpatient   Does not apply q1800   Continuous Infusions: . sodium chloride 10 mL/hr at 01/20/18 1800  . heparin 1,700 Units/hr (01/27/18 1100)     LOS: 8 days       [name], MD [facility] Pager [phone]  If 7PM-7AM, please contact night-coverage [email or web address] Password [facility] 01/27/2018, 12:39 PM

## 2018-01-27 NOTE — Progress Notes (Signed)
ANTICOAGULATION CONSULT NOTE - Follow Up Consult  Pharmacy Consult for Heparin/Coumadin Indication: mechanical heart valve  Allergies  Allergen Reactions  . Coreg [Carvedilol] Other (See Comments)    Terrible cramping in the feet and had a lot of bowel movements, but not diarrhea  . Losartan Swelling    Patient doesn't recall site of swelling  . Spironolactone Nausea Only    Caused extreme nausea  . Verapamil Hives  . Zetia [Ezetimibe] Other (See Comments)    Reaction not recalled  . Zocor [Simvastatin - High Dose] Other (See Comments)    Reaction not recalled    Patient Measurements: Height: 5\' 1"  (154.9 cm) Weight: 150 lb (68 kg) IBW/kg (Calculated) : 47.8 Heparin Dosing Weight:    Vital Signs: Temp: 98.3 F (36.8 C) (04/21 0500) Temp Source: Oral (04/21 0500) BP: 129/73 (04/21 0500) Pulse Rate: 93 (04/21 0500)  Labs: Recent Labs    01/25/18 0447 01/25/18 1851 01/26/18 0719 01/26/18 0728 01/27/18 0518  HGB 8.2*  --  8.6*  --  8.0*  HCT 27.5*  --  28.4*  --  25.5*  PLT 133*  --  174  --  205  LABPROT 20.3*  --  24.3*  --  28.1*  INR 1.75  --  2.20  --  2.65  HEPARINUNFRC 0.16* 0.49 0.43  --  0.29*  CREATININE  --   --   --  0.75  --     Estimated Creatinine Clearance: 50.3 mL/min (by C-G formula based on SCr of 0.75 mg/dL).  Assessment:  Anticoag: Warfarin PTA for [employer] valve AVR (goal 2.5 - 3.5) Needs endoscopy for GIB w/u, held coumadin. s/p vitamin K 10mg  IM x 2 on 4/15, Heparin level 0.16 this AM, no issues with infusion per RN. Hgb 8.2 stable. Plts 133 stable. INR 1.14>1.43>1.75>2.20>>2.65 - 4/15: Vit K 10mg  IM x 2 doses - Home warfarin dose: 7.5 mg on MWF/ 5 mg on all other days, INR 2.44 on admission, last dose 4/12  AM heparin level slightly subtherapeutic at 0.29 INR therapeutic  Goal of Therapy:  Heparin level 0.3-0.7 units/ml  INR 2.5-3.5 Monitor platelets by anticoagulation protocol: Yes   Plan:  Increase heparin to 1700 units / hr  and continue until INR therapeutic for 48 hours HL in 8 hours Warfarin 5mg  x 1 per home dose today Follow up AM labs  Kayron Kalmar A. Levada Dy, PharmD, [place] Pager: [phone]  01/27/2018,10:49 AM

## 2018-01-27 NOTE — Evaluation (Signed)
Physical Therapy Evaluation Patient Details Name: [name] MRN: [id number] DOB: 1940-10-30 Today's Date: 01/27/2018   History of Present Illness  Pt adm 4/12 with symptomatic anemia. EGD and colonoscopy unrevealing and GI suspects anemia secondary to mucosal bleeding from anticoagulation. Pt developed BUE pain which is now resolving. Also rib pain. PMH - CABG, AVR, DM, PVD, HTN  Clinical Impression  Pt presents to PT with unsteady gait due to weakness after limited activity in the hospital >1 week. Expect pt will make good progress and be able to return home alone with HHPT and some intermittent support of family.    Follow Up Recommendations Home health PT;Supervision - Intermittent    Equipment Recommendations  None recommended by PT    Recommendations for Other Services       Precautions / Restrictions Restrictions Weight Bearing Restrictions: No      Mobility  Bed Mobility Overal bed mobility: Modified Independent             General bed mobility comments: Incr time  Transfers Overall transfer level: Needs assistance Equipment used: None Transfers: Sit to/from Stand Sit to Stand: Min guard;Min assist         General transfer comment: From chair min guard for balance. From low commode min assist to bring hips up.  Ambulation/Gait Ambulation/Gait assistance: Min assist;Min guard Ambulation Distance (Feet): 170 Feet Assistive device: None Gait Pattern/deviations: Step-through pattern;Decreased step length - right;Decreased step length - left;Drifts right/left;Trunk flexed;Narrow base of support Gait velocity: decr Gait velocity interpretation: 1.31 - 2.62 ft/sec, indicative of limited community ambulator General Gait Details: Assist for balance. Primarily  min guard but min assist x 1 for loss of balance  Stairs            Wheelchair Mobility    Modified Rankin (Stroke Patients Only)       Balance Overall balance assessment: Needs  assistance Sitting-balance support: No upper extremity supported;Feet supported Sitting balance-Leahy Scale: Good     Standing balance support: No upper extremity supported;During functional activity Standing balance-Leahy Scale: Fair                               Pertinent Vitals/Pain Pain Assessment: Faces Faces Pain Scale: Hurts little more Pain Location: Hands, ribs Pain Descriptors / Indicators: Grimacing;Guarding Pain Intervention(s): Limited activity within patient's tolerance;Monitored during session    [name] expects to be discharged to:: Private residence Living Arrangements: Alone Available Help at Discharge: Family;Available PRN/intermittently Type of Home: House       Home Layout: One level Home Equipment: [place] - 2 wheels;Cane - single point      Prior Function Level of Independence: Independent               Hand Dominance        Extremity/Trunk Assessment   Upper Extremity Assessment Upper Extremity Assessment: Defer to OT evaluation    Lower Extremity Assessment Lower Extremity Assessment: Generalized weakness       Communication   Communication: No difficulties  Cognition Arousal/Alertness: Awake/alert Behavior During Therapy: WFL for tasks assessed/performed Overall Cognitive Status: Within Functional Limits for tasks assessed                                        General Comments      Exercises  Assessment/Plan    PT Assessment Patient needs continued PT services  PT Problem List Decreased strength;Decreased activity tolerance;Decreased balance;Decreased mobility       PT Treatment Interventions DME instruction;Gait training;Stair training;Functional mobility training;Therapeutic activities;Therapeutic exercise;Balance training;Patient/family education    PT Goals (Current goals can be found in the Care Plan section)  Acute Rehab PT Goals Patient Stated Goal: Return  home PT Goal Formulation: With patient Time For Goal Achievement: 02/03/18 Potential to Achieve Goals: Good    Frequency Min 3X/week   Barriers to discharge Decreased caregiver support Lives alone    Co-evaluation               AM-PAC PT "6 Clicks" Daily Activity  Outcome Measure Difficulty turning over in bed (including adjusting bedclothes, sheets and blankets)?: A Little Difficulty moving from lying on back to sitting on the side of the bed? : A Little Difficulty sitting down on and standing up from a chair with arms (e.g., wheelchair, bedside commode, etc,.)?: Unable Help needed moving to and from a bed to chair (including a wheelchair)?: A Little Help needed walking in hospital room?: A Little Help needed climbing 3-5 steps with a railing? : A Little 6 Click Score: 16    End of Session   Activity Tolerance: Patient tolerated treatment well Patient left: in bed;with call bell/phone within reach Nurse Communication: Mobility status(nurse tech) PT Visit Diagnosis: Unsteadiness on feet (R26.81);Muscle weakness (generalized) (M62.81)    Time: 7048-8891 PT Time Calculation (min) (ACUTE ONLY): 30 min   Charges:   PT Evaluation $PT Eval Moderate Complexity: 1 Mod PT Treatments $Gait Training: 8-22 mins   PT G Codes:        [facility] PT [place] 01/27/2018, 2:06 PM

## 2018-01-27 NOTE — Progress Notes (Signed)
ANTICOAGULATION CONSULT NOTE - Follow Up Consult  Pharmacy Consult for Heparin/Coumadin Indication: mechanical heart valve  Allergies  Allergen Reactions  . Coreg [Carvedilol] Other (See Comments)    Terrible cramping in the feet and had a lot of bowel movements, but not diarrhea  . Losartan Swelling    Patient doesn't recall site of swelling  . Spironolactone Nausea Only    Caused extreme nausea  . Verapamil Hives  . Zetia [Ezetimibe] Other (See Comments)    Reaction not recalled  . Zocor [Simvastatin - High Dose] Other (See Comments)    Reaction not recalled    Patient Measurements: Height: 5\' 1"  (154.9 cm) Weight: 150 lb (68 kg) IBW/kg (Calculated) : 47.8 Heparin Dosing Weight:    Vital Signs: Temp: 98.2 F (36.8 C) (04/21 1345) Temp Source: Oral (04/21 1345) BP: 151/50 (04/21 1345) Pulse Rate: 71 (04/21 1345)  Labs: Recent Labs    01/25/18 0447  01/26/18 0719 01/26/18 0728 01/27/18 0518 01/27/18 1853  HGB 8.2*  --  8.6*  --  8.0*  --   HCT 27.5*  --  28.4*  --  25.5*  --   PLT 133*  --  174  --  205  --   LABPROT 20.3*  --  24.3*  --  28.1*  --   INR 1.75  --  2.20  --  2.65  --   HEPARINUNFRC 0.16*   < > 0.43  --  0.29* 0.39  CREATININE  --   --   --  0.75  --   --    < > = values in this interval not displayed.    Estimated Creatinine Clearance: 50.3 mL/min (by C-G formula based on SCr of 0.75 mg/dL).  Assessment:  Anticoag: Warfarin PTA for St Jude valve AVR (goal 2.5 - 3.5) Needs endoscopy for GIB w/u, held coumadin. s/p vitamin K 10mg  IM x 2 on 4/15, Heparin level 0.16 this AM, no issues with infusion per RN. Hgb 8.2 stable. Plts 133 stable. INR 1.14>1.43>1.75>2.20>>2.65 - 4/15: Vit K 10mg  IM x 2 doses - Home warfarin dose: 7.5 mg on MWF/ 5 mg on all other days, INR 2.44 on admission, last dose 4/12  PM heparin level therapeutic INR therapeutic  Goal of Therapy:  Heparin level 0.3-0.7 units/ml  INR 2.5-3.5 Monitor platelets by anticoagulation  protocol: Yes   Plan:  Continue heparin at 1700 units / hr and continue until INR therapeutic for 48 hours  Warfarin 5mg  x 1 per home dose today Follow up AM labs  Thank you Anette Guarneri, PharmD [phone]  01/27/2018,7:42 PM

## 2018-01-28 ENCOUNTER — Other Ambulatory Visit: Payer: Self-pay

## 2018-01-28 LAB — HEPARIN LEVEL (UNFRACTIONATED): HEPARIN UNFRACTIONATED: 0.52 [IU]/mL (ref 0.30–0.70)

## 2018-01-28 LAB — GLUCOSE, CAPILLARY
Glucose-Capillary: 131 mg/dL — ABNORMAL HIGH (ref 65–99)
Glucose-Capillary: 213 mg/dL — ABNORMAL HIGH (ref 65–99)
Glucose-Capillary: 122 mg/dL — ABNORMAL HIGH (ref 65–99)
Glucose-Capillary: 200 mg/dL — ABNORMAL HIGH (ref 65–99)

## 2018-01-28 LAB — CBC
HCT: 26.4 % — ABNORMAL LOW (ref 36.0–46.0)
Hemoglobin: 8.1 g/dL — ABNORMAL LOW (ref 12.0–15.0)
MCH: 23.3 pg — AB (ref 26.0–34.0)
MCHC: 30.7 g/dL (ref 30.0–36.0)
MCV: 76.1 fL — ABNORMAL LOW (ref 78.0–100.0)
Platelets: 172 10*3/uL (ref 150–400)
RBC: 3.47 MIL/uL — AB (ref 3.87–5.11)
RDW: 17.4 % — ABNORMAL HIGH (ref 11.5–15.5)
WBC: 3.7 10*3/uL — ABNORMAL LOW (ref 4.0–10.5)

## 2018-01-28 LAB — PROTIME-INR
INR: 2.95
Prothrombin Time: 30.5 seconds — ABNORMAL HIGH (ref 11.4–15.2)

## 2018-01-28 MED ORDER — FUROSEMIDE 10 MG/ML IJ SOLN
40.0000 mg | Freq: Every day | INTRAMUSCULAR | Status: DC
  Administered 2018-01-29: 40 mg via INTRAVENOUS
  Filled 2018-01-28: qty 4

## 2018-01-28 MED ORDER — WARFARIN SODIUM 7.5 MG PO TABS
7.5000 mg | ORAL_TABLET | Freq: Once | ORAL | Status: AC
  Administered 2018-01-28: 7.5 mg via ORAL
  Filled 2018-01-28: qty 1

## 2018-01-28 NOTE — Progress Notes (Signed)
PROGRESS NOTE    [name]  CNO:[id number] DOB: 1941-04-19 DOA: 01/18/2018 PCP: [name], MD  Brief Narrative: 77 year old woman PMH coronary artery disease, CABG, mechanical aortic valve replacement, was sent to the emergency department by her GI physician for evaluation of anemia. Admitted for further evaluation of anemia. Coumadin held, started on heparin infusion, seen by GI    Assessment & Plan:   Principal Problem:   Symptomatic anemia Active Problems:   Mechanical heart valve present   Type 2 diabetes mellitus (HCC)   Atrial fibrillation (HCC)   GI bleed   Cholelithiasis   Aortic atherosclerosis (HCC)   Macular rash   Thrombocytopenia (HCC)   Arm pain, diffuse Symptomatic anemiain the context of chronic warfarin and aspirin for mechanical aortic valve replacement. EGD and colonoscopy were unrevealing. GI suspects anemia secondary to mucosal bleeding from anticoagulation. No further GI procedures planned. Okay to resume warfarin per GI. --Hemoglobin remainsstable. INR 2.65 today.  DC heparin today. Mechanical AVR --QHU7.95. Continue heparin bridge, warfarin until INRgreater than 2.4. -Chest x-ray done yesterday shows pleural effusion and pulmonary edema.  Patient takes Lasix 60 mg daily at home.  Her ejection fraction is 60%.  Will restart Lasix and reassess tomorrow.  Upper abdominal pain, predominantly ribs-resolved  Hyponatremia, hypokalemia. Modest. Follow clinically. --BMP in a.m.   Thrombocytopenia. --Stable      DVT prophylaxis: Coumadin Code Status: Full code Family Communication: No family available Disposition Plan: Discharge in the next 1-2 days.  Consultants: GI  Procedures: EGD and colonoscopy Antimicrobials: None  Subjective: Breathing feels better.  No chest pain   Objective: Vitals:   01/27/18 0500 01/27/18 1345 01/27/18 2100 01/28/18 0500  BP: 129/73 (!) 151/50 (!) 157/62 (!) 135/49  Pulse: 93 71 89 89    Resp: 18 20 18 18   Temp: 98.3 F (36.8 C) 98.2 F (36.8 C) 98.1 F (36.7 C) 98.2 F (36.8 C)  TempSrc: Oral Oral Oral Oral  SpO2: 94% 94% 93% 97%  Weight:      Height:        Intake/Output Summary (Last 24 hours) at 01/28/2018 1411 Last data filed at 01/28/2018 0957 Gross per 24 hour  Intake 506.02 ml  Output 880 ml  Net -373.98 ml   Filed Weights   01/22/18 1226  Weight: 68 kg (150 lb)    Examination:  General exam: Appears calm and comfortable  Respiratory system: decresed breath sounds  auscultation. Respiratory effort normal. Cardiovascular system: S1 & S2 heard, RRR. No JVD, murmurs, rubs, gallops or clicks. No pedal edema. Gastrointestinal system: Abdomen is nondistended, soft and nontender. No organomegaly or masses felt. Normal bowel sounds heard. Central nervous system: Alert and oriented. No focal neurological deficits. Extremities: Symmetric 5 x 5 power. Skin: No rashes, lesions or ulcers Psychiatry: Judgement and insight appear normal. Mood & affect appropriate.     Data Reviewed: I have personally reviewed following labs and imaging studies  CBC: Recent Labs  Lab 01/23/18 0405 01/24/18 0322 01/25/18 0447 01/26/18 0719 01/27/18 0518 01/28/18 0458  WBC 4.6 4.2 4.0 4.2 3.8* 3.7*  NEUTROABS 3.2  --   --   --   --   --   HGB 8.4* 8.4* 8.2* 8.6* 8.0* 8.1*  HCT 27.0* 28.2* 27.5* 28.4* 25.5* 26.4*  MCV 78.5 77.7* 77.2* 76.5* 76.6* 76.1*  PLT 170 154 133* 174 205 654   Basic Metabolic Panel: Recent Labs  Lab 01/22/18 0736 01/23/18 0405 01/26/18 0728  NA 135 134* 128*  K 3.6 3.5 3.4*  CL 107 104 98*  CO2 20* 21* 21*  GLUCOSE 111* 163* 135*  BUN 10 12 10   CREATININE 0.64 0.81 0.75  CALCIUM 8.2* 8.2* 8.1*  PHOS 3.3  --   --    GFR: Estimated Creatinine Clearance: 50.3 mL/min (by C-G formula based on SCr of 0.75 mg/dL). Liver Function Tests: Recent Labs  Lab 01/22/18 0736 01/23/18 0405 01/26/18 0728  AST  --  33 29  ALT  --  19 18   ALKPHOS  --  70 71  BILITOT  --  1.0 1.0  PROT  --  5.6* 6.2*  ALBUMIN 2.5* 2.5* 2.4*   Recent Labs  Lab 01/26/18 0728  LIPASE 23   No results for input(s): AMMONIA in the last 168 hours. Coagulation Profile: Recent Labs  Lab 01/24/18 0322 01/25/18 0447 01/26/18 0719 01/27/18 0518 01/28/18 0458  INR 1.43 1.75 2.20 2.65 2.95   Cardiac Enzymes: No results for input(s): CKTOTAL, CKMB, CKMBINDEX, TROPONINI in the last 168 hours. BNP (last 3 results) No results for input(s): PROBNP in the last 8760 hours. HbA1C: No results for input(s): HGBA1C in the last 72 hours. CBG: Recent Labs  Lab 01/27/18 1316 01/27/18 1653 01/27/18 2119 01/28/18 0837 01/28/18 1221  GLUCAP 154* 164* 159* 131* 213*   Lipid Profile: No results for input(s): CHOL, HDL, LDLCALC, TRIG, CHOLHDL, LDLDIRECT in the last 72 hours. Thyroid Function Tests: No results for input(s): TSH, T4TOTAL, FREET4, T3FREE, THYROIDAB in the last 72 hours. Anemia Panel: No results for input(s): VITAMINB12, FOLATE, FERRITIN, TIBC, IRON, RETICCTPCT in the last 72 hours. Sepsis Labs: No results for input(s): PROCALCITON, LATICACIDVEN in the last 168 hours.  No results found for this or any previous visit (from the past 240 hour(s)).       Radiology Studies: No results found.      Scheduled Meds: . aspirin EC  81 mg Oral Daily  . atorvastatin  80 mg Oral Daily  . cholecalciferol  1,000 Units Oral Daily  . furosemide  20 mg Intravenous Daily  . hydrALAZINE  100 mg Oral Q8H  . hydrocortisone cream   Topical BID  . insulin aspart  0-5 Units Subcutaneous QHS  . insulin aspart  0-9 Units Subcutaneous TID WC  . irbesartan  300 mg Oral Daily  . magic mouthwash w/lidocaine  2 mL Oral TID  . magnesium oxide   Oral Daily  . metoprolol tartrate  50 mg Oral BID  . pantoprazole  40 mg Oral Daily  . potassium chloride  20 mEq Oral Daily  . sodium chloride flush  3 mL Intravenous Q12H  . vitamin B-12  100 mcg Oral  Daily  . warfarin  7.5 mg Oral ONCE-1800  . Warfarin - Pharmacist Dosing Inpatient   Does not apply q1800   Continuous Infusions: . sodium chloride 10 mL/hr at 01/20/18 1800     LOS: 9 days     [name], MD [facility]  If 7PM-7AM, please contact night-coverage [email or web address] Password [facility] 01/28/2018, 2:11 PM

## 2018-01-28 NOTE — Progress Notes (Addendum)
ANTICOAGULATION CONSULT NOTE - Follow Up Consult  Pharmacy Consult for Heparin/Coumadin Indication: mechanical heart valve  Allergies  Allergen Reactions  . Coreg [Carvedilol] Other (See Comments)    Terrible cramping in the feet and had a lot of bowel movements, but not diarrhea  . Losartan Swelling    Patient doesn't recall site of swelling  . Spironolactone Nausea Only    Caused extreme nausea  . Verapamil Hives  . Zetia [Ezetimibe] Other (See Comments)    Reaction not recalled  . Zocor [Simvastatin - High Dose] Other (See Comments)    Reaction not recalled    Patient Measurements: Height: 5\' 1"  (154.9 cm) Weight: 150 lb (68 kg) IBW/kg (Calculated) : 47.8  Vital Signs: Temp: 98.2 F (36.8 C) (04/22 0500) Temp Source: Oral (04/22 0500) BP: 135/49 (04/22 0500) Pulse Rate: 89 (04/22 0500)  Labs: Recent Labs    01/26/18 0719 01/26/18 0728 01/27/18 0518 01/27/18 1853 01/28/18 0458  HGB 8.6*  --  8.0*  --  8.1*  HCT 28.4*  --  25.5*  --  26.4*  PLT 174  --  205  --  172  LABPROT 24.3*  --  28.1*  --  30.5*  INR 2.20  --  2.65  --  2.95  HEPARINUNFRC 0.43  --  0.29* 0.39 0.52  CREATININE  --  0.75  --   --   --     Estimated Creatinine Clearance: 50.3 mL/min (by C-G formula based on SCr of 0.75 mg/dL).  Assessment: 82 YOF on Warfarin PTA for St Jude valve AVR admitted with GIB- warfarin held for endoscopy and patient was given vitamin K 10mg  IM x2 on 4/15. Now bridging with heparin. Heparin level remains in range this morning at 0.52units/mL. INR 2.95 this morning- 2nd therapeutic reading  Home warfarin dose: 7.5 mg on MWF and 5 mg on all other days, INR 2.44 on admission, last dose 4/12  Goal of Therapy:  Heparin level 0.3-0.7 units/ml  INR 2.5-3.5 Monitor platelets by anticoagulation protocol: Yes   Plan:  Heparin at 1700 units / hr- likely to continue until INR therapeutic for 48h per notes Warfarin 7.5mg  x 1 tonight per home dose Daily heparin level,  INR, and CBC  Annette Liotta D. Danikah Budzik, PharmD, BCPS Clinical Pharmacist Clinical Phone for 01/28/2018 until 3:30pm: [phone] If after 3:30pm, please call main pharmacy at [phone] 01/28/2018 8:34 AM   ADDENDUM Spoke with Dr. Rodena Piety during progression rounds- can stop heparin as INR therapeutic x2. Drug, labs, and consult have been discontinued.  Ferrell Flam D. Trell Secrist, PharmD, BCPS Clinical Pharmacist 01/28/2018 9:33 AM

## 2018-01-28 NOTE — Evaluation (Signed)
Occupational Therapy Evaluation and Discharge Patient Details Name: [name] MRN: [id number] DOB: Sep 30, 1941 Today's Date: 01/28/2018    History of Present Illness Pt adm 4/12 with symptomatic anemia. EGD and colonoscopy unrevealing and GI suspects anemia secondary to mucosal bleeding from anticoagulation. Pt developed BUE pain which is now resolving. Also rib pain. PMH - CABG, AVR, DM, PVD, HTN   Clinical Impression   Pt is functioning at a supervision level for safety due to generalized weakness. Recommending ADL with nursing staff. No further OT needs.    Follow Up Recommendations  No OT follow up    Equipment Recommendations  None recommended by OT    Recommendations for Other Services       Precautions / Restrictions Restrictions Weight Bearing Restrictions: No      Mobility Bed Mobility Overal bed mobility: Modified Independent             General bed mobility comments: HOB up  Transfers Overall transfer level: Needs assistance Equipment used: None Transfers: Sit to/from Stand Sit to Stand: Supervision         General transfer comment: supervised from bed, chair and BSC    Balance     Sitting balance-Leahy Scale: Good                                     ADL either performed or assessed with clinical judgement   ADL                                         General ADL Comments: functioning at a supervision level for safety     Vision Baseline Vision/History: Wears glasses Wears Glasses: At all times Patient Visual Report: No change from baseline       Perception     Praxis      Pertinent Vitals/Pain Pain Assessment: No/denies pain     Hand Dominance Right   Extremity/Trunk Assessment Upper Extremity Assessment Upper Extremity Assessment: Overall WFL for tasks assessed   Lower Extremity Assessment Lower Extremity Assessment: Defer to PT evaluation       Communication  Communication Communication: No difficulties   Cognition Arousal/Alertness: Awake/alert Behavior During Therapy: WFL for tasks assessed/performed Overall Cognitive Status: Within Functional Limits for tasks assessed                                     General Comments       Exercises     Shoulder Instructions      Home Living Family/patient expects to be discharged to:: Private residence Living Arrangements: Alone Available Help at Discharge: Family;Available PRN/intermittently Type of Home: House       Home Layout: One level         Bathroom Toilet: Handicapped height     Home Equipment: Environmental consultant - 2 wheels;Cane - single point          Prior Functioning/Environment Level of Independence: Independent        Comments: drives        OT Problem List:        OT Treatment/Interventions:      OT Goals(Current goals can be found in the care plan section) Acute Rehab OT Goals Patient Stated Goal: Return  home  OT Frequency:     Barriers to D/C:            Co-evaluation              AM-PAC PT "6 Clicks" Daily Activity     Outcome Measure Help from another person eating meals?: None Help from another person taking care of personal grooming?: A Little Help from another person toileting, which includes using toliet, bedpan, or urinal?: A Little Help from another person bathing (including washing, rinsing, drying)?: A Little Help from another person to put on and taking off regular upper body clothing?: None Help from another person to put on and taking off regular lower body clothing?: A Little 6 Click Score: 20   End of Session Equipment Utilized During Treatment: Gait belt  Activity Tolerance: Patient tolerated treatment well Patient left: in chair;with call bell/phone within reach  OT Visit Diagnosis: Muscle weakness (generalized) (M62.81)                Time: 9417-4081 OT Time Calculation (min): 14 min Charges:  OT General  Charges $OT Visit: 1 Visit OT Evaluation $OT Eval Low Complexity: 1 Low G-Codes:     2018-02-21 Nestor Lewandowsky, OTR/L Pager: [phone]  Werner Lean Haze Boyden 21-Feb-2018, 11:03 AM

## 2018-01-29 ENCOUNTER — Inpatient Hospital Stay (HOSPITAL_COMMUNITY): Payer: Medicare HMO

## 2018-01-29 LAB — CBC WITH DIFFERENTIAL/PLATELET
BASOS ABS: 0 10*3/uL (ref 0.0–0.1)
BASOS PCT: 1 %
Eosinophils Absolute: 0.1 10*3/uL (ref 0.0–0.7)
Eosinophils Relative: 2 %
HEMATOCRIT: 25.2 % — AB (ref 36.0–46.0)
Hemoglobin: 7.9 g/dL — ABNORMAL LOW (ref 12.0–15.0)
LYMPHS PCT: 14 %
Lymphs Abs: 0.4 10*3/uL — ABNORMAL LOW (ref 0.7–4.0)
MCH: 23.9 pg — ABNORMAL LOW (ref 26.0–34.0)
MCHC: 31.3 g/dL (ref 30.0–36.0)
MCV: 76.4 fL — ABNORMAL LOW (ref 78.0–100.0)
MONO ABS: 0.1 10*3/uL (ref 0.1–1.0)
Monocytes Relative: 4 %
NEUTROS ABS: 2.5 10*3/uL (ref 1.7–7.7)
Neutrophils Relative %: 79 %
PLATELETS: 212 10*3/uL (ref 150–400)
RBC: 3.3 MIL/uL — AB (ref 3.87–5.11)
RDW: 17.5 % — AB (ref 11.5–15.5)
WBC: 3.1 10*3/uL — AB (ref 4.0–10.5)

## 2018-01-29 LAB — OSMOLALITY: OSMOLALITY: 274 mosm/kg — AB (ref 275–295)

## 2018-01-29 LAB — BASIC METABOLIC PANEL
ANION GAP: 7 (ref 5–15)
BUN: 12 mg/dL (ref 6–20)
CALCIUM: 8.2 mg/dL — AB (ref 8.9–10.3)
CO2: 20 mmol/L — ABNORMAL LOW (ref 22–32)
Chloride: 99 mmol/L — ABNORMAL LOW (ref 101–111)
Creatinine, Ser: 0.72 mg/dL (ref 0.44–1.00)
GLUCOSE: 172 mg/dL — AB (ref 65–99)
POTASSIUM: 4.4 mmol/L (ref 3.5–5.1)
SODIUM: 126 mmol/L — AB (ref 135–145)

## 2018-01-29 LAB — GLUCOSE, CAPILLARY
GLUCOSE-CAPILLARY: 152 mg/dL — AB (ref 65–99)
Glucose-Capillary: 121 mg/dL — ABNORMAL HIGH (ref 65–99)
Glucose-Capillary: 140 mg/dL — ABNORMAL HIGH (ref 65–99)
Glucose-Capillary: 201 mg/dL — ABNORMAL HIGH (ref 65–99)

## 2018-01-29 LAB — PROTIME-INR
INR: 2.99
PROTHROMBIN TIME: 30.9 s — AB (ref 11.4–15.2)

## 2018-01-29 LAB — OSMOLALITY, URINE: OSMOLALITY UR: 276 mosm/kg — AB (ref 300–900)

## 2018-01-29 MED ORDER — ALUM & MAG HYDROXIDE-SIMETH 200-200-20 MG/5ML PO SUSP
30.0000 mL | ORAL | Status: DC | PRN
  Administered 2018-01-29: 30 mL via ORAL
  Filled 2018-01-29: qty 30

## 2018-01-29 MED ORDER — FUROSEMIDE 10 MG/ML IJ SOLN: 40.0000 mg | Freq: Two times a day (BID) | INTRAMUSCULAR | Status: DC

## 2018-01-29 MED ORDER — FUROSEMIDE 10 MG/ML IJ SOLN
40.0000 mg | Freq: Two times a day (BID) | INTRAMUSCULAR | Status: DC
  Administered 2018-01-29 – 2018-01-30 (×2): 40 mg via INTRAVENOUS
  Filled 2018-01-29 (×2): qty 4

## 2018-01-29 MED ORDER — POTASSIUM CHLORIDE CRYS ER 20 MEQ PO TBCR
20.0000 meq | EXTENDED_RELEASE_TABLET | Freq: Two times a day (BID) | ORAL | Status: DC
  Administered 2018-01-29 (×2): 20 meq via ORAL
  Filled 2018-01-29 (×3): qty 1

## 2018-01-29 MED ORDER — POTASSIUM CHLORIDE CRYS ER 20 MEQ PO TBCR: 20.0000 meq | EXTENDED_RELEASE_TABLET | Freq: Every day | ORAL | 0 refills | Status: DC

## 2018-01-29 MED ORDER — MAGNESIUM OXIDE 400 (241.3 MG) MG PO TABS: 400.0000 mg | ORAL_TABLET | Freq: Every day | ORAL | Status: DC

## 2018-01-29 MED ORDER — WARFARIN SODIUM 5 MG PO TABS
5.0000 mg | ORAL_TABLET | Freq: Once | ORAL | Status: AC
  Administered 2018-01-29: 5 mg via ORAL
  Filled 2018-01-29: qty 1

## 2018-01-29 MED ORDER — HYDROCORTISONE 1 % EX CREA: TOPICAL_CREAM | Freq: Two times a day (BID) | CUTANEOUS | 0 refills | Status: DC

## 2018-01-29 NOTE — Progress Notes (Signed)
PT Cancellation Note  Patient Details Name: [name] MRN: [id number] DOB: [birth year]   Cancelled Treatment:    Reason Eval/Treat Not Completed: Patient declined, no reason specified patient politely declines PT due to not having eaten lunch yet, being very fatigued. Reports she would be open to trying later in the afternoon. Plan to attempt to try back later as/if schedule allows.    Deniece Ree PT, DPT, CBIS  Supplemental Physical Therapist [facility]   Pager [phone]

## 2018-01-29 NOTE — Progress Notes (Signed)
PROGRESS NOTE    [name]  AUQ:[id number] DOB: Oct 11, 1940 DOA: 01/18/2018 PCP: [name], MD  Brief Narrative: 77 year old woman PMH coronary artery disease, CABG, mechanical aortic valve replacement, was sent to the emergency department by her GI physician for evaluation of anemia. Admitted for further evaluation of anemia. Coumadin held, started on heparin infusion, seen by GI    Assessment & Plan:   Principal Problem:   Symptomatic anemia Active Problems:   Mechanical heart valve present   Type 2 diabetes mellitus (HCC)   Atrial fibrillation (HCC)   GI bleed   Cholelithiasis   Aortic atherosclerosis (HCC)   Macular rash   Thrombocytopenia (HCC)   Arm pain, diffuse  1]Symptomatic anemiain the context of chronic warfarin and aspirin for mechanical aortic valve replacement. EGD and colonoscopy were unrevealing. GI suspects anemia secondary to mucosal bleeding from anticoagulation. No further GI procedures planned. Okay to resume warfarin per GI.-Hemoglobin remainsstable. INR 2.65today.  heparin stopped yesterday.   2]Mechanical AVR--INR2.99. Continue coumadin.  3] pulmonary edema-patient was complaining of shortness of breath and right-sided pleuritic chest pain.  She does take Lasix 60 mg daily at home.  She received IV fluid hydration at the time of admission to the hospital.  Her Lasix was on hold.  Lasix was restarted 4/22. Chest x-ray done today still shows persistent mild CHF with small effusions, increasing opacity at the left lung base possible atelectasis versus effusion versus pneumonia.  I will increase her Lasix to 40 mg twice a day for 2 days and reevaluate.  when she is discharged she should be discharged on Lasix and potassium.  4] hyponatremia-hypovolemic possibly related to fluid resuscitation at the time of admission.  Her sodium at the time of admission was 134 and now it has dropped down to 126.  With restarting Lasix her sodium should  get better.  However I will do serum and urine osmolality follow-up BMP tomorrow we will hold her discharge for today.  5] type 2 diabetes patient takes glipizide and metformin at home.  Restart glipizide.  6] chronic atrial fibrillation continue rate control with beta-blocker.      DVT prophylaxis: Coumadin  Code Status full code :Family Communication: No family available Disposition Plan: Hopefully she can be discharged home in the next 1-2 days.  What is keeping her in the hospital is hyponatremia worsening, and pulmonary edema.  Once these 2 things are resolved she can be discharged home with home health PT and OT. Consultants:  Gastroenterology  Procedures: EGD and colonoscopy Antimicrobials none Subjective she feels her breathing is slightly better.  Objective: Vitals:   01/28/18 1539 01/28/18 2116 01/29/18 0631 01/29/18 0946  BP: (!) 159/45 (!) 154/47 (!) 162/64 (!) 158/47  Pulse: 65 76 92 79  Resp: 20 16 20    Temp: 98.2 F (36.8 C) 99 F (37.2 C) 98.2 F (36.8 C)   TempSrc: Oral Oral    SpO2: 98% 94% 91%   Weight:      Height:        Intake/Output Summary (Last 24 hours) at 01/29/2018 1111 Last data filed at 01/29/2018 0200 Gross per 24 hour  Intake 240 ml  Output 700 ml  Net -460 ml   Filed Weights   01/22/18 1226  Weight: 68 kg (150 lb)    Examination:  General exam: Appears calm and comfortable  Respiratory system: Crackles  to auscultation. Respiratory effort normal. Cardiovascular system: S1 & S2 heard, RRR. No JVD, murmurs, rubs, gallops or  clicks. No pedal edema. Gastrointestinal system: Abdomen is nondistended, soft and nontender. No organomegaly or masses felt. Normal bowel sounds heard. Central nervous system: Alert and oriented. No focal neurological deficits. Extremities: Symmetric 5 x 5 power. Skin: No rashes, lesions or ulcers Psychiatry: Judgement and insight appear normal. Mood & affect appropriate.     Data Reviewed: I have  personally reviewed following labs and imaging studies  CBC: Recent Labs  Lab 01/23/18 0405  01/25/18 0447 01/26/18 0719 01/27/18 0518 01/28/18 0458 01/29/18 0425  WBC 4.6   < > 4.0 4.2 3.8* 3.7* 3.1*  NEUTROABS 3.2  --   --   --   --   --  2.5  HGB 8.4*   < > 8.2* 8.6* 8.0* 8.1* 7.9*  HCT 27.0*   < > 27.5* 28.4* 25.5* 26.4* 25.2*  MCV 78.5   < > 77.2* 76.5* 76.6* 76.1* 76.4*  PLT 170   < > 133* 174 205 172 212   < > = values in this interval not displayed.   Basic Metabolic Panel: Recent Labs  Lab 01/23/18 0405 01/26/18 0728 01/29/18 0425  NA 134* 128* 126*  K 3.5 3.4* 4.4  CL 104 98* 99*  CO2 21* 21* 20*  GLUCOSE 163* 135* 172*  BUN 12 10 12   CREATININE 0.81 0.75 0.72  CALCIUM 8.2* 8.1* 8.2*   GFR: Estimated Creatinine Clearance: 50.3 mL/min (by C-G formula based on SCr of 0.72 mg/dL). Liver Function Tests: Recent Labs  Lab 01/23/18 0405 01/26/18 0728  AST 33 29  ALT 19 18  ALKPHOS 70 71  BILITOT 1.0 1.0  PROT 5.6* 6.2*  ALBUMIN 2.5* 2.4*   Recent Labs  Lab 01/26/18 0728  LIPASE 23   No results for input(s): AMMONIA in the last 168 hours. Coagulation Profile: Recent Labs  Lab 01/25/18 0447 01/26/18 0719 01/27/18 0518 01/28/18 0458 01/29/18 0425  INR 1.75 2.20 2.65 2.95 2.99   Cardiac Enzymes: No results for input(s): CKTOTAL, CKMB, CKMBINDEX, TROPONINI in the last 168 hours. BNP (last 3 results) No results for input(s): PROBNP in the last 8760 hours. HbA1C: No results for input(s): HGBA1C in the last 72 hours. CBG: Recent Labs  Lab 01/28/18 0837 01/28/18 1221 01/28/18 1718 01/28/18 2115 01/29/18 0809  GLUCAP 131* 213* 122* 200* 121*   Lipid Profile: No results for input(s): CHOL, HDL, LDLCALC, TRIG, CHOLHDL, LDLDIRECT in the last 72 hours. Thyroid Function Tests: No results for input(s): TSH, T4TOTAL, FREET4, T3FREE, THYROIDAB in the last 72 hours. Anemia Panel: No results for input(s): VITAMINB12, FOLATE, FERRITIN, TIBC, IRON,  RETICCTPCT in the last 72 hours. Sepsis Labs: No results for input(s): PROCALCITON, LATICACIDVEN in the last 168 hours.  No results found for this or any previous visit (from the past 240 hour(s)).       Radiology Studies: Dg Chest 1 View  Result Date: 01/29/2018 CLINICAL DATA:  Some shortness of breath, history of hypoxia EXAM: CHEST  1 VIEW COMPARISON:  Chest x-ray of 01/26/2018 FINDINGS: There is little change and mild CHF with persistent moderate cardiomegaly, pulmonary vascular congestion, and small effusions, left greater than right. Opacity medially at the left lung base has increased somewhat which may represent atelectasis although superimposed pneumonia cannot be excluded. Median sternotomy sutures are noted prior cardiac valve replacement. IMPRESSION: 1. Persistent mild CHF with small effusions. 2. Increasing opacity at the left lung base may be due to atelectasis and effusion but pneumonia cannot be excluded. Recommend follow-up. Electronically Signed   By:  Ivar Drape M.D.   On: 01/29/2018 09:01        Scheduled Meds: . aspirin EC  81 mg Oral Daily  . atorvastatin  80 mg Oral Daily  . cholecalciferol  1,000 Units Oral Daily  . furosemide  40 mg Intravenous BID  . hydrALAZINE  100 mg Oral Q8H  . hydrocortisone cream   Topical BID  . insulin aspart  0-5 Units Subcutaneous QHS  . insulin aspart  0-9 Units Subcutaneous TID WC  . irbesartan  300 mg Oral Daily  . magic mouthwash w/lidocaine  2 mL Oral TID  . magnesium oxide   Oral Daily  . metoprolol tartrate  50 mg Oral BID  . pantoprazole  40 mg Oral Daily  . potassium chloride  20 mEq Oral Daily  . potassium chloride  20 mEq Oral BID  . sodium chloride flush  3 mL Intravenous Q12H  . vitamin B-12  100 mcg Oral Daily  . warfarin  5 mg Oral ONCE-1800  . Warfarin - Pharmacist Dosing Inpatient   Does not apply q1800   Continuous Infusions: . sodium chloride 10 mL/hr at 01/20/18 1800     LOS: 10 days      [name], MD [facility]  If 7PM-7AM, please contact night-coverage [email or web address] Password [email or web address] 01/29/2018, 11:11 AM

## 2018-01-29 NOTE — Progress Notes (Signed)
Physical Therapy Treatment Patient Details Name: [name] MRN: [id number] DOB: 15-May-1941 Today's Date: 01/29/2018    History of Present Illness Pt adm 4/12 with symptomatic anemia. EGD and colonoscopy unrevealing and GI suspects anemia secondary to mucosal bleeding from anticoagulation. Pt developed BUE pain which is now resolving. Also rib pain. PMH - CABG, AVR, DM, PVD, HTN    PT Comments    Patient received sleeping but easily woken, willing to participate in PT this afternoon. Introduced RW per PT POC, patient steady with RW and able to perform functional transfers with S and VC for sequencing and hand placement, also able to ambulate approximately 15f and limited due to fatigue today. Able to stand at sink without UE assistance and wash face/hands/brush teeth with min guard. She was left sitting at EOB per her request with all needs otherwise met this afternoon.    Follow Up Recommendations  Home health PT;Supervision - Intermittent     Equipment Recommendations  Rolling walker with 5" wheels    Recommendations for Other Services       Precautions / Restrictions Precautions Precautions: Fall Restrictions Weight Bearing Restrictions: No    Mobility  Bed Mobility Overal bed mobility: Modified Independent                Transfers Overall transfer level: Needs assistance Equipment used: Rolling walker (2 wheeled) Transfers: Sit to/from Stand Sit to Stand: Supervision         General transfer comment: S for safety, VC for correct sequencing and hand placement with RW   Ambulation/Gait Ambulation/Gait assistance: Supervision Ambulation Distance (Feet): 160 Feet Assistive device: Rolling walker (2 wheeled) Gait Pattern/deviations: Step-through pattern;Decreased step length - right;Decreased step length - left;Decreased stride length;Trunk flexed Gait velocity: decr   General Gait Details: steady with RW, able to turn and navigate obstacles without LOB  with RW    Stairs             Wheelchair Mobility    Modified Rankin (Stroke Patients Only)       Balance Overall balance assessment: Needs assistance Sitting-balance support: No upper extremity supported;Feet supported Sitting balance-Leahy Scale: Good     Standing balance support: No upper extremity supported;During functional activity Standing balance-Leahy Scale: Fair                              Cognition Arousal/Alertness: Awake/alert Behavior During Therapy: WFL for tasks assessed/performed Overall Cognitive Status: Within Functional Limits for tasks assessed                                        Exercises      General Comments        Pertinent Vitals/Pain Pain Assessment: No/denies pain Faces Pain Scale: No hurt Pain Intervention(s): Limited activity within patient's tolerance;Monitored during session    Home Living                      Prior Function            PT Goals (current goals can now be found in the care plan section) Acute Rehab PT Goals Patient Stated Goal: Return home PT Goal Formulation: With patient Time For Goal Achievement: 02/03/18 Potential to Achieve Goals: Good Progress towards PT goals: Progressing toward goals    Frequency    Min  3X/week      PT Plan Equipment recommendations need to be updated;Current plan remains appropriate    Co-evaluation              AM-PAC PT "6 Clicks" Daily Activity  Outcome Measure  Difficulty turning over in bed (including adjusting bedclothes, sheets and blankets)?: A Little Difficulty moving from lying on back to sitting on the side of the bed? : A Little Difficulty sitting down on and standing up from a chair with arms (e.g., wheelchair, bedside commode, etc,.)?: A Little Help needed moving to and from a bed to chair (including a wheelchair)?: A Little Help needed walking in hospital room?: A Little Help needed climbing 3-5 steps  with a railing? : A Little 6 Click Score: 18    End of Session Equipment Utilized During Treatment: Gait belt Activity Tolerance: Patient tolerated treatment well Patient left: in bed;with call bell/phone within reach   PT Visit Diagnosis: Unsteadiness on feet (R26.81);Muscle weakness (generalized) (M62.81)     Time: 9806-0789 PT Time Calculation (min) (ACUTE ONLY): 28 min  Charges:  $Gait Training: 8-22 mins $Therapeutic Activity: 8-22 mins                    G Codes:       Deniece Ree PT, DPT, CBIS  Supplemental Physical Therapist [place]   Pager [phone]

## 2018-01-29 NOTE — Plan of Care (Signed)
°  Problem: Clinical Measurements: °Goal: Ability to maintain clinical measurements within normal limits will improve °Outcome: Progressing °Goal: Diagnostic test results will improve °Outcome: Progressing °Goal: Cardiovascular complication will be avoided °Outcome: Progressing °  °

## 2018-01-29 NOTE — Progress Notes (Signed)
Echo for warfarin Indication: mechanical heart valve  Allergies  Allergen Reactions  . Coreg [Carvedilol] Other (See Comments)    Terrible cramping in the feet and had a lot of bowel movements, but not diarrhea  . Losartan Swelling    Patient doesn't recall site of swelling  . Spironolactone Nausea Only    Caused extreme nausea  . Verapamil Hives  . Zetia [Ezetimibe] Other (See Comments)    Reaction not recalled  . Zocor [Simvastatin - High Dose] Other (See Comments)    Reaction not recalled    Patient Measurements: Height: 5\' 1"  (154.9 cm) Weight: 150 lb (68 kg) IBW/kg (Calculated) : 47.8  Vital Signs: Temp: 98.2 F (36.8 C) (04/23 0631) BP: 158/47 (04/23 0946) Pulse Rate: 79 (04/23 0946)  Labs: Recent Labs    01/27/18 0518 01/27/18 1853 01/28/18 0458 01/29/18 0425  HGB 8.0*  --  8.1* 7.9*  HCT 25.5*  --  26.4* 25.2*  PLT 205  --  172 212  LABPROT 28.1*  --  30.5* 30.9*  INR 2.65  --  2.95 2.99  HEPARINUNFRC 0.29* 0.39 0.52  --   CREATININE  --   --   --  0.72    Estimated Creatinine Clearance: 50.3 mL/min (by C-G formula based on SCr of 0.72 mg/dL).  Assessment: 4 YOF on Warfarin PTA for St Jude valve AVR admitted with GIB- warfarin held for endoscopy and patient was given vitamin K 10mg  IM x2 on 4/15. S/p heparin bridge- heparin stopped yesterday as INR was therapeutic x2. INR remains in range this morning at 2.99. Hgb and plts stable over past several days- no bleeding noted.  Home warfarin dose: 7.5 mg on MWF and 5 mg on all other days, INR 2.44 on admission, last dose 4/12  Goal of Therapy:  Heparin level 0.3-0.7 units/ml  INR 2.5-3.5 Monitor platelets by anticoagulation protocol: Yes   Plan:  Warfarin 5mg  x 1 tonight per home dose Daily INR and CBC Recommend resuming home dose (5mg  daily except 7.5mg  on MWF) upon discharge with INR check on Friday 4/26.  [name] [name], PharmD, BCPS Clinical  Pharmacist Clinical Phone for 01/29/2018 until 3:30pm: [phone] If after 3:30pm, please call main pharmacy at [phone] 01/29/2018 10:21 AM

## 2018-01-30 ENCOUNTER — Inpatient Hospital Stay (HOSPITAL_COMMUNITY): Payer: Medicare HMO

## 2018-01-30 LAB — CBC WITH DIFFERENTIAL/PLATELET
Basophils Absolute: 0 10*3/uL (ref 0.0–0.1)
Basophils Relative: 2 %
EOS ABS: 0.1 10*3/uL (ref 0.0–0.7)
EOS PCT: 4 %
HCT: 26.6 % — ABNORMAL LOW (ref 36.0–46.0)
Hemoglobin: 8.1 g/dL — ABNORMAL LOW (ref 12.0–15.0)
LYMPHS ABS: 0.5 10*3/uL — AB (ref 0.7–4.0)
Lymphocytes Relative: 19 %
MCH: 22.9 pg — AB (ref 26.0–34.0)
MCHC: 30.5 g/dL (ref 30.0–36.0)
MCV: 75.4 fL — ABNORMAL LOW (ref 78.0–100.0)
MONO ABS: 0.2 10*3/uL (ref 0.1–1.0)
MONOS PCT: 7 %
Neutro Abs: 1.8 10*3/uL (ref 1.7–7.7)
Neutrophils Relative %: 70 %
PLATELETS: 199 10*3/uL (ref 150–400)
RBC: 3.53 MIL/uL — ABNORMAL LOW (ref 3.87–5.11)
RDW: 17.1 % — ABNORMAL HIGH (ref 11.5–15.5)
WBC: 2.6 10*3/uL — ABNORMAL LOW (ref 4.0–10.5)

## 2018-01-30 LAB — GLUCOSE, CAPILLARY
GLUCOSE-CAPILLARY: 137 mg/dL — AB (ref 65–99)
Glucose-Capillary: 188 mg/dL — ABNORMAL HIGH (ref 65–99)

## 2018-01-30 LAB — BASIC METABOLIC PANEL
Anion gap: 9 (ref 5–15)
BUN: 9 mg/dL (ref 6–20)
CO2: 22 mmol/L (ref 22–32)
CREATININE: 0.79 mg/dL (ref 0.44–1.00)
Calcium: 8.5 mg/dL — ABNORMAL LOW (ref 8.9–10.3)
Chloride: 98 mmol/L — ABNORMAL LOW (ref 101–111)
GFR calc Af Amer: >60 mL/min (ref >60)
GFR calc non Af Amer: >60 mL/min (ref >60)
Glucose, Bld: 127 mg/dL — ABNORMAL HIGH (ref 65–99)
Potassium: 4.1 mmol/L (ref 3.5–5.1)
SODIUM: 129 mmol/L — AB (ref 135–145)

## 2018-01-30 LAB — PROTIME-INR
INR: 3.54
Prothrombin Time: 35.2 seconds — ABNORMAL HIGH (ref 11.4–15.2)

## 2018-01-30 NOTE — Discharge Summary (Addendum)
Physician Discharge Summary  [name] OEV:[id number] DOB: June 08, 1941 DOA: 01/18/2018  PCP: [name], MD  Admit date: 01/18/2018 Discharge date: 01/30/2018  Admitted From: Home Disposition: Home Recommendations for Outpatient Follow-up:  1. Follow up with PCP in 1-2 weeks 2. Please obtain INR/ BMP/CBC 02/01/2018.  Home Health yes  Equipment/Devices: None Discharge Condition: Stable CODE STATUS: Full code Diet recommendation: Cardiac  Brief/Interim Summary:77 year old woman PMH coronary artery disease, CABG, mechanical aortic valve replacement, was sent to the emergency department by her GI physician for evaluation of anemia. Admitted for further evaluation of anemia. Coumadin held, started on heparin infusion, seen by GI.    Discharge Diagnoses:  Principal Problem:   Symptomatic anemia Active Problems:   Mechanical heart valve present   Type 2 diabetes mellitus (HCC)   Atrial fibrillation (HCC)   GI bleed   Cholelithiasis   Aortic atherosclerosis (HCC)   Macular rash   Thrombocytopenia (HCC)   Arm pain, diffuse 1]Symptomatic anemiain the context of chronic warfarin and aspirin for mechanical aortic valve replacement. EGD and colonoscopy were unrevealing. GI suspects anemia secondary to mucosal bleeding from anticoagulation. No further GI procedures planned.  Coumadin was resumed her INR on the day of discharge is 3.54.  2]Mechanical AVR--INR3.54 .Continue coumadin.  3] pulmonary edema-patient was restarted on the Lasix she was taking at home.  Her symptoms improved with ongoing Lasix treatment.  She will be discharged home on home dose of 60 mg Lasix per day.  4] hyponatremia-hypervolemic.  Improved with diuresis.  She must get a follow-up BMP and CBC and INR 02/01/2018.  5] type 2 diabetes continue glipizide and metformin.   6] chronic atrial fibrillation continue rate control with beta-blocker.       Discharge Instructions  Discharge  Instructions    Call MD for:  difficulty breathing, headache or visual disturbances   Complete by:  As directed    Call MD for:  persistant dizziness or light-headedness   Complete by:  As directed    Call MD for:  persistant nausea and vomiting   Complete by:  As directed    Call MD for:  severe uncontrolled pain   Complete by:  As directed    Call MD for:  temperature >100.4   Complete by:  As directed    Diet - low sodium heart healthy   Complete by:  As directed    Increase activity slowly   Complete by:  As directed      Allergies as of 01/30/2018      Reactions   Coreg [carvedilol] Other (See Comments)   Terrible cramping in the feet and had a lot of bowel movements, but not diarrhea   Losartan Swelling   Patient doesn't recall site of swelling   Spironolactone Nausea Only   Caused extreme nausea   Verapamil Hives   Zetia [ezetimibe] Other (See Comments)   Reaction not recalled   Zocor [simvastatin - High Dose] Other (See Comments)   Reaction not recalled      Medication List    STOP taking these medications   MAGNESIUM PO Replaced by:  magnesium oxide 400 (241.3 Mg) MG tablet   VASELINE PURE ULTRA WHITE Gel Generic drug:  white petrolatum     TAKE these medications   acetaminophen 325 MG tablet Commonly known as:  TYLENOL Take 325-650 mg by mouth every 6 (six) hours as needed (for pain or headaches).   ALPRAZolam 0.5 MG tablet Commonly known as:  XANAX Take  0.25-0.5 mg by mouth 2 (two) times daily as needed for anxiety.   aspirin EC 81 MG tablet Take 81 mg by mouth every Monday, Wednesday, and Friday.   atorvastatin 80 MG tablet Commonly known as:  LIPITOR Take 1 tablet (80 mg total) by mouth daily. What changed:  when to take this   furosemide 40 MG tablet Commonly known as:  LASIX Take 60 mg by mouth daily.   glipiZIDE 2.5 MG 24 hr tablet Commonly known as:  GLUCOTROL XL Take 2.5 mg by mouth daily.   hydrALAZINE 100 MG tablet Commonly known  as:  APRESOLINE Take 1 tablet (100 mg total) by mouth 3 (three) times daily.   hydrocortisone cream 1 % Apply topically 2 (two) times daily.   irbesartan 300 MG tablet Commonly known as:  AVAPRO Take 1 tablet (300 mg total) by mouth daily.   magnesium oxide 400 (241.3 Mg) MG tablet Commonly known as:  MAG-OX Take 1 tablet (400 mg total) by mouth daily. Replaces:  MAGNESIUM PO   metFORMIN 500 MG tablet Commonly known as:  GLUCOPHAGE Take 500 mg by mouth 2 (two) times daily with a meal.   metoprolol tartrate 50 MG tablet Commonly known as:  LOPRESSOR Take 50 mg by mouth 2 (two) times daily.   pantoprazole 40 MG tablet Commonly known as:  PROTONIX Take 40 mg by mouth daily.   potassium chloride SA 20 MEQ tablet Commonly known as:  K-DUR,KLOR-CON Take 1 tablet (20 mEq total) by mouth daily.   SYSTANE PRESERVATIVE FREE 0.4-0.3 % Soln Generic drug:  Polyethyl Glyc-Propyl Glyc PF Apply 1-2 drops to eye 3 (three) times daily as needed (for irritation).   traMADol 50 MG tablet Commonly known as:  ULTRAM Take 50 mg by mouth every 6 (six) hours as needed for moderate pain or severe pain.   vitamin B-12 100 MCG tablet Commonly known as:  CYANOCOBALAMIN Take 100 mcg by mouth daily.   Vitamin D3 1000 units Caps Take 1,000 Units by mouth daily.   warfarin 5 MG tablet Commonly known as:  COUMADIN Take as directed. If you are unsure how to take this medication, talk to your nurse or doctor. Original instructions:  TAKE 1 TO 1.5 (ONE TO ONE & ONE-HALF) TABLETS BY MOUTH DAILY AS DIRECTED BY COUMADIN CLINIC What changed:  See the new instructions.       Allergies  Allergen Reactions  . Coreg [Carvedilol] Other (See Comments)    Terrible cramping in the feet and had a lot of bowel movements, but not diarrhea  . Losartan Swelling    Patient doesn't recall site of swelling  . Spironolactone Nausea Only    Caused extreme nausea  . Verapamil Hives  . Zetia [Ezetimibe] Other  (See Comments)    Reaction not recalled  . Zocor [Simvastatin - High Dose] Other (See Comments)    Reaction not recalled    Consultations:  Gastroenterology   Procedures/Studies: Ct Abdomen Pelvis W Wo Contrast  Result Date: 01/18/2018 CLINICAL DATA:  Low hemoglobin. Question pancreatic lesion on ultrasound same day. Nausea for 3 months. RIGHT upper quadrant pain EXAM: CT ABDOMEN AND PELVIS WITHOUT AND WITH CONTRAST TECHNIQUE: Multidetector CT imaging of the abdomen and pelvis was performed following the standard protocol before and following the bolus administration of intravenous contrast. CONTRAST:  148mL ISOVUE-300 IOPAMIDOL (ISOVUE-300) INJECTION 61% COMPARISON:  Ultrasound 01/17/2018 FINDINGS: Lower chest: Small bilateral pleural effusions, LEFT greater than RIGHT. Calcified granuloma in the LEFT lower lobe. Hepatobiliary: No  focal hepatic lesion. The gallbladder is collapsed around multiple gallstones. No biliary duct dilatation. Common bile duct normal caliber Pancreas: There is no pancreatic duct dilatation. No pancreatic mass identified in the head of the pancreas. There is periampullary duodenum diverticulum present measuring 2.4 cm. Spleen: Normal spleen Adrenals/urinary tract: Adrenal glands and kidneys are normal. The ureters and bladder normal. Stomach/Bowel: Stomach normal. Periampullary duodenum diverticulum. A second duodenum diverticulum along the third person of the duodenum measuring 2.4 cm (image 19/9). The small bowel is normal. Appendix and terminal ileum are normal. Multiple diverticula of the sigmoid colon without acute inflammation. Vascular/Lymphatic: Abdominal aorta is normal caliber with atherosclerotic calcification. There is no retroperitoneal or periportal lymphadenopathy. No pelvic lymphadenopathy. Reproductive: Post hysterectomy anatomy Other: No free fluid. Musculoskeletal: No aggressive osseous lesion. IMPRESSION: 1. No evidence of pancreatic head mass. 2. Peri  ampullary duodenal diverticulum. Second diverticulum in the third portion duodenum. 3. Gallbladder completely collapsed around multiple gallstones. Findings suggest chronic cholecystitis. Consider HIDA scan with functional assessment (ejection fraction). 4. Bilateral small effusions. 5. Extensive colonic diverticulosis without evidence diverticulitis. 6.  Aortic Atherosclerosis (ICD10-I70.0). Electronically Signed   By: Suzy Bouchard M.D.   On: 01/18/2018 16:28   Dg Chest 1 View  Result Date: 01/29/2018 CLINICAL DATA:  Some shortness of breath, history of hypoxia EXAM: CHEST  1 VIEW COMPARISON:  Chest x-ray of 01/26/2018 FINDINGS: There is little change and mild CHF with persistent moderate cardiomegaly, pulmonary vascular congestion, and small effusions, left greater than right. Opacity medially at the left lung base has increased somewhat which may represent atelectasis although superimposed pneumonia cannot be excluded. Median sternotomy sutures are noted prior cardiac valve replacement. IMPRESSION: 1. Persistent mild CHF with small effusions. 2. Increasing opacity at the left lung base may be due to atelectasis and effusion but pneumonia cannot be excluded. Recommend follow-up. Electronically Signed   By: Ivar Drape M.D.   On: 01/29/2018 09:01   Dg Chest 2 View  Result Date: 01/26/2018 CLINICAL DATA:  Shortness of breath and abdominal pain for 1 day. EXAM: CHEST - 2 VIEW COMPARISON:  PA and lateral chest 02/23/2017. FINDINGS: There is cardiomegaly and mild interstitial edema. Small bilateral pleural effusions are seen, larger on the left. There is some basilar airspace disease bilaterally. No pneumothorax. No acute bony abnormality. IMPRESSION: Cardiomegaly with mild interstitial edema and small effusions, larger on the left. Bibasilar airspace disease could be due to more intense edema, atelectasis or less likely pneumonia. Electronically Signed   By: Inge Rise M.D.   On: 01/26/2018 12:04    US Abdomen Complete  Result Date: 01/17/2018 CLINICAL DATA:  Right upper quadrant pain, nausea for 3 months EXAM: ABDOMEN ULTRASOUND COMPLETE COMPARISON:  Ultrasound of the abdomen of 03/30/2014 FINDINGS: Gallbladder: The gallbladder appears to be filled with gallstones and contracted. The largest gallstone measures approximately 2.1 cm. There is no pain over the gallbladder with compression currently, and the gallbladder wall is not thickened. Common bile duct: Diameter: The common bile duct is normal in caliber measuring 4.5 mm in diameter. Liver: The parenchyma of the liver is normal in echogenicity. No focal hepatic abnormality is seen. Portal vein is patent on color Doppler imaging with normal direction of blood flow towards the liver. IVC: No abnormality visualized. Pancreas: There is soft tissue prominence in the region of the head of the pancreas extending to the uncinate process. The distal pancreatic duct does not appear to be dilated, but either CT using pancreatic protocol or MRI would  be recommended to assess further. The tail of the pancreas is obscured. Spleen: The spleen measures 15.0 cm with scattered calcifications. The volume is 546 cubic cm consistent with moderate splenomegaly. Right Kidney: Length: 11.4 cm.  No hydronephrosis is seen. Left Kidney: Length: 11.7 cm.  No hydronephrosis is noted. Abdominal aorta: The abdominal aorta is normal in caliber. Other findings: There does appear to be a left pleural effusion present. IMPRESSION: 1. Prominent soft tissue in the head of the pancreas extending toward the uncinate process. Recommend either CT using pancreatic protocol or MRI to assess for a pancreatic lesion. The pancreatic duct does not appear to be dilated. 2. Multiple gallstones fill the gallbladder with strong acoustical shadowing. No present evidence of acute cholecystitis is seen. 3. Moderate splenomegaly. 4. Left pleural effusion. Electronically Signed   By: Ivar Drape M.D.   On:  01/17/2018 10:02   Dg Abd 2 Views  Result Date: 01/26/2018 CLINICAL DATA:  Abdominal pain for 1 day. EXAM: ABDOMEN - 2 VIEW COMPARISON:  CT abdomen 01/18/2018. FINDINGS: No free intraperitoneal air is identified. The bowel gas pattern is nonobstructive. Convex left scoliosis and advanced right hip osteoarthritis are noted. IMPRESSION: No acute abnormality. Electronically Signed   By: Inge Rise M.D.   On: 01/26/2018 12:07    (Echo, Carotid, EGD, Colonoscopy, ERCP)    Subjective:   Discharge Exam: Vitals:   01/30/18 0548 01/30/18 0813  BP: (!) 159/59 134/61  Pulse: 75 74  Resp: 20   Temp: 98.4 F (36.9 C)   SpO2: 95%    Vitals:   01/29/18 1204 01/29/18 2128 01/30/18 0548 01/30/18 0813  BP: (!) 127/52 (!) 144/45 (!) 159/59 134/61  Pulse: 76 84 75 74  Resp: (!) 26 18 20    Temp: 98.4 F (36.9 C) 98.1 F (36.7 C) 98.4 F (36.9 C)   TempSrc:      SpO2: 97% 96% 95%   Weight:      Height:        General: Pt is alert, awake, not in acute distress Cardiovascular: RRR, S1/S2 +, no rubs, no gallops Respiratory: CTA bilaterally, no wheezing, no rhonchi Abdominal: Soft, NT, ND, bowel sounds + Extremities: no edema, no cyanosis    The results of significant diagnostics from this hospitalization (including imaging, microbiology, ancillary and laboratory) are listed below for reference.     Microbiology: No results found for this or any previous visit (from the past 240 hour(s)).   Labs: BNP (last 3 results) No results for input(s): BNP in the last 8760 hours. Basic Metabolic Panel: Recent Labs  Lab 01/26/18 0728 01/29/18 0425 01/30/18 0313  NA 128* 126* 129*  K 3.4* 4.4 4.1  CL 98* 99* 98*  CO2 21* 20* 22  GLUCOSE 135* 172* 127*  BUN 10 12 9   CREATININE 0.75 0.72 0.79  CALCIUM 8.1* 8.2* 8.5*   Liver Function Tests: Recent Labs  Lab 01/26/18 0728  AST 29  ALT 18  ALKPHOS 71  BILITOT 1.0  PROT 6.2*  ALBUMIN 2.4*   Recent Labs  Lab 01/26/18 0728   LIPASE 23   No results for input(s): AMMONIA in the last 168 hours. CBC: Recent Labs  Lab 01/26/18 0719 01/27/18 0518 01/28/18 0458 01/29/18 0425 01/30/18 0313  WBC 4.2 3.8* 3.7* 3.1* 2.6*  NEUTROABS  --   --   --  2.5 1.8  HGB 8.6* 8.0* 8.1* 7.9* 8.1*  HCT 28.4* 25.5* 26.4* 25.2* 26.6*  MCV 76.5* 76.6* 76.1* 76.4* 75.4*  PLT 174 205 172 212 199   Cardiac Enzymes: No results for input(s): CKTOTAL, CKMB, CKMBINDEX, TROPONINI in the last 168 hours. BNP: Invalid input(s): POCBNP CBG: Recent Labs  Lab 01/29/18 0809 01/29/18 1301 01/29/18 1653 01/29/18 2127 01/30/18 0737  GLUCAP 121* 152* 140* 201* 137*   D-Dimer No results for input(s): DDIMER in the last 72 hours. Hgb A1c No results for input(s): HGBA1C in the last 72 hours. Lipid Profile No results for input(s): CHOL, HDL, LDLCALC, TRIG, CHOLHDL, LDLDIRECT in the last 72 hours. Thyroid function studies No results for input(s): TSH, T4TOTAL, T3FREE, THYROIDAB in the last 72 hours.  Invalid input(s): FREET3 Anemia work up No results for input(s): VITAMINB12, FOLATE, FERRITIN, TIBC, IRON, RETICCTPCT in the last 72 hours. Urinalysis    Component Value Date/Time   COLORURINE YELLOW 01/18/2018 [place] 01/18/2018 1049   LABSPEC 1.006 01/18/2018 1049   PHURINE 7.0 01/18/2018 1049   GLUCOSEU NEGATIVE 01/18/2018 1049   HGBUR NEGATIVE 01/18/2018 1049   BILIRUBINUR NEGATIVE 01/18/2018 1049   KETONESUR NEGATIVE 01/18/2018 1049   PROTEINUR 100 (A) 01/18/2018 1049   UROBILINOGEN 1.0 10/06/2011 1709   NITRITE NEGATIVE 01/18/2018 1049   LEUKOCYTESUR TRACE (A) 01/18/2018 1049   Sepsis Labs Invalid input(s): PROCALCITONIN,  WBC,  LACTICIDVEN Microbiology No results found for this or any previous visit (from the past 240 hour(s)).   Time coordinating discharge: Over 38 minutes  SIGNED:   [name], MD  [facility] 01/30/2018, 10:14 AM Pager   If 7PM-7AM, please contact  night-coverage [email or web address] Password [email or web address]

## 2018-01-30 NOTE — Care Management Note (Signed)
Case Management Note  Patient Details  Name: [name] MRN: [id number] Date of Birth: 07-02-1941  Subjective/Objective:    Symptomatic anemia, hx of Saint Jude AVR,HTN, HLD, DM, prior tobacco abuse, PVD. From home alone. Supportive family.   Guerreiro [name] (Son) Charlies Silvers (Daughter)    [phone]      PCP: Dr. Domenick Gong          Action/Plan: Transition to home with home with home health services to follow.  Pt states has transportation to home.  Expected Discharge Date:  01/30/18               Expected Discharge Plan:  [place]  In-House Referral:     Discharge planning Services  CM Consult  Post Acute Care Choice:    Choice offered to:  Patient  DME Arranged:   N/A DME Agency:   N/A  HH Arranged:  PT HH Agency:  [facility]  Status of Service:  Completed, signed off  If discussed at [place] of Stay Meetings, dates discussed:    Additional Comments:  Sharin Mons, RN 01/30/2018, 11:46 AM

## 2018-01-30 NOTE — Consult Note (Signed)
            [facility] Primary Care Navigator  01/30/2018  [name] September 11, 1941 [id number]  Went to seepatientat the bedside toidentify possible discharge needs. Patientreportsthat she felt "weakness- could hardly walk" and was sent by GI physician for evaluation of anemia (symptomatic anemia) which had ledtothis admission.  PatientendorsesDr.Richard [name] with [employer] as her primary care provider.   Patientverbalized using [facility] Order Delivery service and Kristopher Oppenheim on [employer] toobtain medications without difficulty.  Patientreports managingherownmedications at [facility] "pill box" system filled once a week.  Patient statesthatshe was driving prior to admission buther neighbor (Debra F.) can providetransportation to her doctors'appointments when needed.  [employer] transportation benefits discussed with patient and she was very grateful of information.  Patientreportslivingat home alone. Her neighbor Hilda Blades), daughters [name] and Helene Kelp) and son [name]) are able to provide assistance with her care if needed.  Anticipated plan for discharge ishome with home health services per therapy recommendation.  Patientvoiced understandingto callprimarycareprovider'soffice when she returns home,for a post discharge follow-upvisitwithin1- 2 weeksor sooner if needs arise.Patient letter (with PCP's contact number) was provided asareminder.   Discussed with patientregarding [facility] CM services available for health management at homeand she hadindicatedinterestforit.  Patientplanstodiscuss with her primary care provider on next visit, for further health managementservicesthatshe willbe needing at home. Patientverbalizedunderstandingof needto seekreferral from primary care provider to [facility] care management ifdeemed necessary and appropriatefor anyservicesin the nearfuture.   [facility] care  management information was provided for futureneedsthatshe may have.  Patient hadonly opted and verbally agreed forEMMI callstofollow-up withherrecovery. Referral made for [facility] General calls after discharge.   For additional questions please contact:  Edwena Felty A. Breckin [place], BSN, RN-BC [name] PRIMARY CARE Navigator Cell: [phone]

## 2018-01-30 NOTE — Progress Notes (Signed)
Blanch Media [place]au to be D/C'd Home per MD order.  Discussed with the patient and all questions fully answered.  VSS, Skin clean, dry and intact without evidence of skin break down, no evidence of skin tears noted. IV catheter discontinued intact. Site without signs and symptoms of complications. Dressing and pressure applied.  An After Visit Summary was printed and given to the patient. Patient received prescription.  D/c education completed with patient/family including follow up instructions, medication list, d/c activities limitations if indicated, with other d/c instructions as indicated by MD - patient able to verbalize understanding, all questions fully answered.   Patient instructed to return to ED, call 911, or call MD for any changes in condition.   Patient escorted via [place], and D/C home via private auto.  Holley Raring 01/30/2018 3:34 PM

## 2018-02-01 ENCOUNTER — Emergency Department (HOSPITAL_COMMUNITY): Payer: Medicare HMO

## 2018-02-01 ENCOUNTER — Encounter (HOSPITAL_COMMUNITY): Payer: Self-pay | Admitting: Emergency Medicine

## 2018-02-01 ENCOUNTER — Emergency Department (HOSPITAL_COMMUNITY)
Admission: EM | Admit: 2018-02-01 | Discharge: 2018-02-02 | Disposition: A | Discharge status: Home / Self Care | Payer: Medicare HMO | Attending: Emergency Medicine | Admitting: Emergency Medicine

## 2018-02-01 DIAGNOSIS — R112 Nausea with vomiting, unspecified: Secondary | ICD-10-CM | POA: Insufficient documentation

## 2018-02-01 DIAGNOSIS — K922 Gastrointestinal hemorrhage, unspecified: Secondary | ICD-10-CM | POA: Diagnosis present

## 2018-02-01 DIAGNOSIS — R5383 Other fatigue: Secondary | ICD-10-CM | POA: Diagnosis not present

## 2018-02-01 DIAGNOSIS — R110 Nausea: Secondary | ICD-10-CM

## 2018-02-01 DIAGNOSIS — E860 Dehydration: Secondary | ICD-10-CM | POA: Insufficient documentation

## 2018-02-01 DIAGNOSIS — E119 Type 2 diabetes mellitus without complications: Secondary | ICD-10-CM | POA: Insufficient documentation

## 2018-02-01 DIAGNOSIS — R531 Weakness: Secondary | ICD-10-CM

## 2018-02-01 DIAGNOSIS — Z952 Presence of prosthetic heart valve: Secondary | ICD-10-CM

## 2018-02-01 DIAGNOSIS — R197 Diarrhea, unspecified: Secondary | ICD-10-CM | POA: Insufficient documentation

## 2018-02-01 DIAGNOSIS — I10 Essential (primary) hypertension: Secondary | ICD-10-CM | POA: Diagnosis not present

## 2018-02-01 DIAGNOSIS — Z87891 Personal history of nicotine dependence: Secondary | ICD-10-CM | POA: Diagnosis not present

## 2018-02-01 DIAGNOSIS — Z7982 Long term (current) use of aspirin: Secondary | ICD-10-CM | POA: Diagnosis not present

## 2018-02-01 DIAGNOSIS — Z79899 Other long term (current) drug therapy: Secondary | ICD-10-CM | POA: Insufficient documentation

## 2018-02-01 DIAGNOSIS — Z7984 Long term (current) use of oral hypoglycemic drugs: Secondary | ICD-10-CM | POA: Insufficient documentation

## 2018-02-01 DIAGNOSIS — Z7901 Long term (current) use of anticoagulants: Secondary | ICD-10-CM | POA: Diagnosis not present

## 2018-02-01 DIAGNOSIS — Z951 Presence of aortocoronary bypass graft: Secondary | ICD-10-CM | POA: Insufficient documentation

## 2018-02-01 DIAGNOSIS — E871 Hypo-osmolality and hyponatremia: Secondary | ICD-10-CM | POA: Diagnosis present

## 2018-02-01 DIAGNOSIS — I2510 Atherosclerotic heart disease of native coronary artery without angina pectoris: Secondary | ICD-10-CM | POA: Diagnosis not present

## 2018-02-01 DIAGNOSIS — R05 Cough: Secondary | ICD-10-CM | POA: Diagnosis not present

## 2018-02-01 DIAGNOSIS — I4891 Unspecified atrial fibrillation: Secondary | ICD-10-CM | POA: Diagnosis present

## 2018-02-01 LAB — COMPREHENSIVE METABOLIC PANEL
ALBUMIN: 2.5 g/dL — AB (ref 3.5–5.0)
ALT: 27 U/L (ref 14–54)
ANION GAP: 10 (ref 5–15)
AST: 45 U/L — AB (ref 15–41)
Alkaline Phosphatase: 74 U/L (ref 38–126)
BUN: 16 mg/dL (ref 6–20)
CHLORIDE: 100 mmol/L — AB (ref 101–111)
CO2: 21 mmol/L — ABNORMAL LOW (ref 22–32)
Calcium: 8.7 mg/dL — ABNORMAL LOW (ref 8.9–10.3)
Creatinine, Ser: 1.01 mg/dL — ABNORMAL HIGH (ref 0.44–1.00)
GFR calc Af Amer: 60 mL/min — ABNORMAL LOW (ref >60)
GFR, EST NON AFRICAN AMERICAN: 52 mL/min — AB (ref >60)
GLUCOSE: 171 mg/dL — AB (ref 65–99)
POTASSIUM: 4.4 mmol/L (ref 3.5–5.1)
Sodium: 131 mmol/L — ABNORMAL LOW (ref 135–145)
TOTAL PROTEIN: 6.6 g/dL (ref 6.5–8.1)
Total Bilirubin: 0.7 mg/dL (ref 0.3–1.2)

## 2018-02-01 LAB — CBC
HCT: 29.2 % — ABNORMAL LOW (ref 36.0–46.0)
HEMOGLOBIN: 9.2 g/dL — AB (ref 12.0–15.0)
MCH: 23.8 pg — ABNORMAL LOW (ref 26.0–34.0)
MCHC: 31.5 g/dL (ref 30.0–36.0)
MCV: 75.5 fL — AB (ref 78.0–100.0)
Platelets: 275 10*3/uL (ref 150–400)
RBC: 3.87 MIL/uL (ref 3.87–5.11)
RDW: 17.5 % — AB (ref 11.5–15.5)
WBC: 3.4 10*3/uL — AB (ref 4.0–10.5)

## 2018-02-01 LAB — TYPE AND SCREEN
ABO/RH(D): A POS
ANTIBODY SCREEN: NEGATIVE

## 2018-02-01 LAB — LIPASE, BLOOD: Lipase: 30 U/L (ref 11–51)

## 2018-02-01 MED ORDER — ONDANSETRON HCL 4 MG/2ML IJ SOLN
4.0000 mg | Freq: Once | INTRAMUSCULAR | Status: AC
  Administered 2018-02-02: 4 mg via INTRAVENOUS
  Filled 2018-02-01: qty 2

## 2018-02-01 MED ORDER — SODIUM CHLORIDE 0.9 % IV BOLUS
500.0000 mL | Freq: Once | INTRAVENOUS | Status: AC
  Administered 2018-02-02: 500 mL via INTRAVENOUS

## 2018-02-01 NOTE — ED Triage Notes (Signed)
Pt family states she was recently in the hospital for anemia with blood transfusions, had a scope done that was negative. States she still has nausea with weakness. Pale upon arrival, shortness of breath with exertion.

## 2018-02-01 NOTE — ED Provider Notes (Signed)
[place] EMERGENCY DEPARTMENT Provider Note   CSN: [id number] Arrival date & time: 02/01/18  1332     History   Chief Complaint Chief Complaint  Patient presents with  . Anemia  . Weakness  . Nausea    HPI [name] is a 77 y.o. female with a hx of a-fib (ASA and coumadin), NIDDM, mechanical aortic valve, PAD, HTN, high cholesterol, CAD with hx of CABG in 2005, GI bleed presents to the Emergency Department complaining of gradual, persistent, progressively worsening generalized fatigue and nausea onset . Pt with decreased appetite, but denies food worsening the symptoms.  Pt reports she is able to eat despite nausea.  Pt denies fever, chills, headache, neck pain, chest pain, SOB, abd pain, syncope, dysuria.  Pt reports 1 episode of NBNB emesis and several episodes of diarrhea today onset at 4am.  Pt reports she was improved, but didn't feel strong at discharge.  She denies nausea at discharge.    Pt's daughter at bedside reports she is so weak that she cannot care for herself.  Daughter reports food makes her very nauseated.  Record review shows that pt was d/c from the hospital on 01/30/18 after admission for symptomatic anemia.  She was evaluated by GI with EGD and colonoscopy without a source.  Suspect mucosal bleeding from anticoagulation.  Pt was given 2 units of blood during her hiospitalization.  Her coumadin was initially held, but then resumed at discharge.  Additionally, pt had pulmonary edema and she was restarted on her home lasix with improvement in her symptoms.     The history is provided by the patient, medical records and a relative. No language interpreter was used.    Past Medical History:  Diagnosis Date  . Aortic atherosclerosis ([place]) 01/23/2018  . Atrial fibrillation ([place])   . Carotid artery disease ([place])   . Cholelithiasis 01/23/2018  . Chronic anticoagulation   . Coronary artery disease    status post coronary artery bypass grafting  times 07/10/2004  . Diabetes mellitus   . Hypercholesteremia   . Hypertension   . Mechanical heart valve present    H. aortic valve replacement at the time of bypass surgery October 2005  . Peripheral arterial disease (H[facility])    history of left common iliac artery PTA and stenting for a chronic total occlusion 08/26/01    Patient Active Problem List   Diagnosis Date Noted  . General weakness 02/02/2018  . Macular rash 01/25/2018  . Thrombocytopenia ([place]) 01/25/2018  . Arm pain, diffuse 01/25/2018  . Symptomatic anemia 01/23/2018  . Cholelithiasis 01/23/2018  . Aortic atherosclerosis ([place]) 01/23/2018  . GI bleed 01/18/2018  . Atrial fibrillation ([name]) 01/12/2016  . Coronary artery disease 08/18/2013  . Carotid artery disease ([place]) 08/18/2013  . Peripheral arterial disease ([place]) 08/18/2013  . Type 2 diabetes mellitus ([place]) 08/18/2013  . Benign positional vertigo 10/06/2011  . Hyponatremia 10/06/2011  . Influenza 10/06/2011  . Sinusitis 10/06/2011  . Hypertension   . Hypercholesteremia   . Mechanical heart valve present   . Chronic anticoagulation     Past Surgical History:  Procedure Laterality Date  . AORTIC VALVE REPLACEMENT    . CARDIAC CATHETERIZATION  11/10/2004   40% right common illiac, 70% in stent restenosis of distal left common illiac,   . CARDIAC CATHETERIZATION  05/18/2004   LAD 50-70% midstenosis, RCA dominant w/50% stenosis, 50% Right common Illiac artery ostial stenosis, 90% in stent restenosis within midportion of left common illiac  stent  . Carotid Duplex  03/12/2012   RSA-elev. velocities suggestive of a 50-69% diameter reduction, Right&Left Bulb/Prox ICA-mild-mod.fibrous plaqueelevating Velocities abnormal study.  . COLONOSCOPY WITH PROPOFOL N/A 01/22/2018   Procedure: COLONOSCOPY WITH PROPOFOL;  Surgeon: Wilford Corner, MD;  Location: [facility];  Service: Endoscopy;  Laterality: N/A;  . ESOPHAGOGASTRODUODENOSCOPY (EGD) WITH PROPOFOL N/A 01/22/2018    Procedure: ESOPHAGOGASTRODUODENOSCOPY (EGD) WITH PROPOFOL;  Surgeon: Wilford Corner, MD;  Location: [place];  Service: Endoscopy;  Laterality: N/A;  . Lower Ext. Duplex  03/12/2012   Right Proximal CIA- vessel narrowing w/elevated velocities 0-49% diameter reduction. Right SFA-mild mixed density plaque throughout vessel.  [name] NM MYOCAR PERF WALL MOTION  05/19/2010   protocol: Persantine, post stress EF 65%, negative for ischemia, low risk scan  . TRANSTHORACIC ECHOCARDIOGRAM  08/29/2012   Moderately calcified annulus of mitral valve, moderate regurg. of both mitral valve and tricuspid valve.      OB History   None      Home Medications    Prior to Admission medications   Medication Sig Start Date End Date Taking? Authorizing Provider  acetaminophen (TYLENOL) 325 MG tablet Take 325-650 mg by mouth every 6 (six) hours as needed (for pain or headaches).   Yes [provider]  ALPRAZolam Duanne Moron) 0.5 MG tablet Take 0.25-0.5 mg by mouth See admin instructions. Take 0.25-0.5 mg two times a day as needed for anxiety and 0.25-0.5 mg at bedtime scheduled   Yes [provider]  aspirin EC 81 MG tablet Take 81 mg by mouth every Monday, Wednesday, and Friday.    Yes [provider]  atorvastatin (LIPITOR) 80 MG tablet Take 1 tablet (80 mg total) by mouth daily. Patient taking differently: Take 80 mg by mouth daily with supper.  09/25/17  Yes Lorretta Harp, MD  Cholecalciferol (VITAMIN D3) 1000 UNITS CAPS Take 1,000 Units by mouth daily.    Yes [provider]  furosemide (LASIX) 40 MG tablet Take 60 mg by mouth daily.    Yes [provider]  glipiZIDE (GLUCOTROL XL) 2.5 MG 24 hr tablet Take 2.5 mg by mouth daily.  04/04/17  Yes [provider]  hydrALAZINE (APRESOLINE) 100 MG tablet Take 1 tablet (100 mg total) by mouth 3 (three) times daily. 11/07/17  Yes Lorretta Harp, MD  irbesartan (AVAPRO) 300 MG tablet Take 1 tablet (300 mg total)  by mouth daily. 09/25/17  Yes Lorretta Harp, MD  magnesium oxide (MAG-OX) 400 (241.3 Mg) MG tablet Take 1 tablet (400 mg total) by mouth daily. 01/30/18  Yes [name], MD  metFORMIN (GLUCOPHAGE) 500 MG tablet Take 500 mg by mouth 2 (two) times daily with a meal.    Yes [provider]  metoprolol tartrate (LOPRESSOR) 50 MG tablet Take 50 mg by mouth 2 (two) times daily. 10/16/17  Yes [provider]  pantoprazole (PROTONIX) 40 MG tablet Take 40 mg by mouth daily.  12/05/14  Yes [provider]  Polyethyl Glyc-Propyl Glyc PF (SYSTANE PRESERVATIVE FREE) 0.4-0.3 % SOLN Place 1-2 drops into both eyes 3 (three) times daily as needed (for irritation).    Yes [provider]  potassium chloride SA (K-DUR,KLOR-CON) 20 MEQ tablet Take 1 tablet (20 mEq total) by mouth daily. 01/30/18  Yes [name], MD  traMADol (ULTRAM) 50 MG tablet Take 50 mg by mouth every 6 (six) hours as needed (for headaches or pain).    Yes [provider]  vitamin B-12 (CYANOCOBALAMIN) 100  MCG tablet Take 100 mcg by mouth daily.    Yes [provider]  warfarin (COUMADIN) 5 MG tablet TAKE 1 TO 1.5 (ONE TO ONE & ONE-HALF) TABLETS BY MOUTH DAILY AS DIRECTED BY COUMADIN CLINIC Patient taking differently: Take 5 mg by mouth in the morning on Sun/Tues/Thurs/Sat and 7.5 mg on Mon/Wed/Fri 09/10/17  Yes Lorretta Harp, MD  hydrocortisone cream 1 % Apply topically 2 (two) times daily. 01/29/18   [name], MD  ondansetron [facility] ODT) 4 MG disintegrating tablet 4mg  ODT q6-8 hours prn nausea/vomit 02/02/18   Nalani Andreen, Jarrett Soho, PA-C    Family History Family History  Problem Relation Age of Onset  . Breast cancer Neg Hx     Social History Social History   Tobacco Use  . Smoking status: Former Smoker    Packs/day: 1.00    Years: 30.00    Pack years: 30.00    Types: Cigarettes  . Smokeless tobacco: Never Used  Substance Use Topics  . Alcohol  use: No    Alcohol/week: 0.0 oz  . Drug use: No     Allergies   Coreg [carvedilol]; Losartan; Spironolactone; Verapamil; Zetia [ezetimibe]; and Zocor [simvastatin - high dose]   Review of Systems Review of Systems  Constitutional: Positive for fatigue. Negative for appetite change, diaphoresis, fever and unexpected weight change.  HENT: Negative for mouth sores.   Eyes: Negative for visual disturbance.  Respiratory: Negative for cough, chest tightness, shortness of breath and wheezing.   Cardiovascular: Negative for chest pain.  Gastrointestinal: Positive for diarrhea, nausea and vomiting. Negative for abdominal pain and constipation.  Endocrine: Negative for polydipsia, polyphagia and polyuria.  Genitourinary: Negative for dysuria, frequency, hematuria and urgency.  Musculoskeletal: Negative for back pain and neck stiffness.  Skin: Negative for rash.  Allergic/Immunologic: Negative for immunocompromised state.  Neurological: Positive for weakness (generalized). Negative for syncope, light-headedness and headaches.  Hematological: Does not bruise/bleed easily.  Psychiatric/Behavioral: Negative for sleep disturbance. The patient is not nervous/anxious.      Physical Exam Updated Vital Signs BP (!) 146/46   Pulse 76   Temp 98.2 F (36.8 C) (Oral)   Resp 17   SpO2 98%   Physical Exam  Constitutional: She appears well-developed and well-nourished. No distress.  Awake, alert, nontoxic appearance  HENT:  Head: Normocephalic and atraumatic.  Mouth/Throat: Mucous membranes are dry.  Mucous membranes are very dry  Eyes: Conjunctivae are normal. No scleral icterus.  Neck: Normal range of motion. Neck supple.  Cardiovascular: Normal rate, regular rhythm and intact distal pulses.  Pulmonary/Chest: Effort normal and breath sounds normal. No respiratory distress. She has no wheezes.  Equal chest expansion Clear and equal breath sounds, dry cough.  Abdominal: Soft. Bowel sounds  are normal. She exhibits no mass. There is no tenderness. There is no rebound and no guarding.  Genitourinary: Rectal exam shows fissure. Rectal exam shows no external hemorrhoid, no internal hemorrhoid, no mass, no tenderness and anal tone normal.  Genitourinary Comments: Chaperone present.  DRE with minimal stool in the rectal vault and some bright red blood.  Very small superficial anal fissure noted without active bleeding.  Musculoskeletal: Normal range of motion. She exhibits no edema.  Neurological: She is alert.  Speech is clear and goal oriented Moves extremities without ataxia  Skin: Skin is warm and dry. She is not diaphoretic. There is pallor.  Psychiatric: She has a normal mood and affect.  Nursing note and vitals reviewed.    ED Treatments /  Results  Labs (all labs ordered are listed, but only abnormal results are displayed) Labs Reviewed  COMPREHENSIVE METABOLIC PANEL - Abnormal; Notable for the following components:      Result Value   Sodium 131 (*)    Chloride 100 (*)    CO2 21 (*)    Glucose, Bld 171 (*)    Creatinine, Ser 1.01 (*)    Calcium 8.7 (*)    Albumin 2.5 (*)    AST 45 (*)    GFR calc non Af Amer 52 (*)    GFR calc Af Amer 60 (*)    All other components within normal limits  CBC - Abnormal; Notable for the following components:   WBC 3.4 (*)    Hemoglobin 9.2 (*)    HCT 29.2 (*)    MCV 75.5 (*)    MCH 23.8 (*)    RDW 17.5 (*)    All other components within normal limits  URINALYSIS, ROUTINE W REFLEX MICROSCOPIC - Abnormal; Notable for the following components:   Hgb urine dipstick SMALL (*)    Protein, ur 30 (*)    Leukocytes, UA TRACE (*)    All other components within normal limits  PROTIME-INR - Abnormal; Notable for the following components:   Prothrombin Time 34.2 (*)    All other components within normal limits  POC OCCULT BLOOD, ED - Abnormal; Notable for the following components:   Fecal Occult Bld POSITIVE (*)    All other  components within normal limits  LIPASE, BLOOD  TYPE AND SCREEN    EKG EKG Interpretation  Date/Time:  Friday February 01 2018 15:03:23 EDT Ventricular Rate:  74 PR Interval:    QRS Duration: 90 QT Interval:  396 QTC Calculation: 439 R Axis:   115 Text Interpretation:  Atrial fibrillation Possible Right ventricular hypertrophy Abnormal ECG No significant change since last tracing Confirmed by Addison Lank [phone]) on 02/01/2018 8:30:54 PM   Radiology Dg Chest 2 View  Result Date: 02/02/2018 CLINICAL DATA:  Anemia requiring blood transfusion. Nausea and weakness. Productive cough at times. EXAM: CHEST - 2 VIEW COMPARISON:  01/30/2018 FINDINGS: Stable cardiomegaly with aortic atherosclerosis. Small left pleural effusion blunting the left lateral and posterior costophrenic angles. Calcified granulomata are redemonstrated in the right mid and left lung base. Osteoarthritis of the [facility] and glenohumeral joints. IMPRESSION: Stable cardiomegaly with aortic atherosclerosis. Small left pleural effusion unchanged. Electronically Signed   By: Ashley Royalty M.D.   On: 02/02/2018 00:03    Procedures Procedures (including critical care time)  Medications Ordered in ED Medications  sodium chloride 0.9 % bolus 500 mL (0 mLs Intravenous Stopped 02/02/18 0115)  ondansetron (ZOFRAN) injection 4 mg (4 mg Intravenous Given 02/02/18 0015)     Initial Impression / Assessment and Plan / ED Course  I have reviewed the triage vital signs and the nursing notes.  Pertinent labs & imaging results that were available during my care of the patient were reviewed by me and considered in my medical decision making (see chart for details).  Clinical Course as of Feb 03 408  Sat Feb 02, 2018  0407 Pt ambulates in the hall with steady gait. She has tolerated a small amount of PO fluids and solids without emesis.  She is requesting discharge home.   [HM]    Clinical Course User Index [HM] Klaudia Beirne, Jarrett Soho, [place]     Patient presents with severe nausea, generalized weakness and decreased p.o. intake.  She was recently admitted for  symptomatic anemia and received blood transfusion.  Hemoglobin today is 9.2 and improved from previous.  Patient with mild hypokalemia and hyponatremia however these are also improved.  Patient's INR is at baseline.  Her fecal occult is positive but DRE was without gross hematochezia.  Urinalysis without evidence of urinary tract infection.  Chest x-ray without evidence of pneumonia, pneumothorax or pulmonary edema.  I personally evaluated these images.  Patient's family is adamant that she is too weak to care for herself at home.  Discussed with Dr. Myna Hidalgo who agreed to admit patient for likely nursing home placement due to her profound weakness.  After this discussion, patient and family decided that they do not want her admitted.  Patient reports she feels significantly better after the Zofran and wishes to return home with prescription for same.    The patient was discussed with and seen by Dr. Betsey Holiday who agrees with the treatment plan.   Final Clinical Impressions(s) / ED Diagnoses   Final diagnoses:  Weakness  Dehydration  Nausea    ED Discharge Orders        Ordered    ondansetron (ZOFRAN ODT) 4 MG disintegrating tablet     02/02/18 0408       Jossette Zirbel, Jarrett Soho, PA-C 02/02/18 0410    Orpah Greek, MD 02/02/18 2322

## 2018-02-02 ENCOUNTER — Encounter (HOSPITAL_COMMUNITY): Payer: Self-pay | Admitting: Family Medicine

## 2018-02-02 DIAGNOSIS — R531 Weakness: Secondary | ICD-10-CM

## 2018-02-02 LAB — URINALYSIS, ROUTINE W REFLEX MICROSCOPIC
BACTERIA UA: NONE SEEN
Bilirubin Urine: NEGATIVE
Glucose, UA: NEGATIVE mg/dL
KETONES UR: NEGATIVE mg/dL
Nitrite: NEGATIVE
PROTEIN: 30 mg/dL — AB
Specific Gravity, Urine: 1.013 (ref 1.005–1.030)
pH: 5 (ref 5.0–8.0)

## 2018-02-02 LAB — PROTIME-INR
INR: 3.41
Prothrombin Time: 34.2 seconds — ABNORMAL HIGH (ref 11.4–15.2)

## 2018-02-02 LAB — POC OCCULT BLOOD, ED: Fecal Occult Bld: POSITIVE — AB

## 2018-02-02 MED ORDER — ONDANSETRON 4 MG PO TBDP: ORAL_TABLET | ORAL | 0 refills | Status: DC

## 2018-02-02 NOTE — ED Notes (Signed)
ED Provider at bedside. 

## 2018-02-02 NOTE — ED Notes (Signed)
Pt and family understood dc material. NAD noted. Script given at dc 

## 2018-02-02 NOTE — ED Notes (Signed)
Pt ambulated in hall without assistance. Pt denied weakness, dizziness, or lightheadedness

## 2018-02-02 NOTE — ED Notes (Signed)
Hospitalist at bedside 

## 2018-02-02 NOTE — ED Notes (Signed)
Nurse will collect labs. 

## 2018-02-02 NOTE — ED Provider Notes (Signed)
Patient presented to the ER with generalized weakness  Face to face Exam: HEENT - PERRLA Lungs - CTAB Heart - RRR, no M/R/G Abd - S/NT/ND Neuro - alert, oriented x3  Plan: Patient with recent hospitalization for GI bleed.  Her hemoglobin has improved, no clinical history of ongoing bleeding.  Patient has had diarrhea, nausea and vomiting at home, feeling generalized weakness.  Her work-up here in the ER has been unrevealing.   Orpah Greek, MD 02/02/18 [phone]

## 2018-02-02 NOTE — Discharge Instructions (Addendum)
1. Medications: zofran, usual home medications °2. Treatment: rest, drink plenty of fluids, advance diet slowly °3. Follow Up: Please followup with your primary doctor in 2 days for discussion of your diagnoses and further evaluation after today's visit; if you do not have a primary care doctor use the resource guide provided to find one; Please return to the ER for persistent vomiting, high fevers or worsening symptoms ° °

## 2018-02-05 ENCOUNTER — Other Ambulatory Visit (HOSPITAL_COMMUNITY): Payer: Self-pay | Admitting: Gastroenterology

## 2018-02-05 DIAGNOSIS — R740 Nonspecific elevation of levels of transaminase and lactic acid dehydrogenase [LDH]: Secondary | ICD-10-CM | POA: Diagnosis not present

## 2018-02-05 DIAGNOSIS — D500 Iron deficiency anemia secondary to blood loss (chronic): Secondary | ICD-10-CM | POA: Diagnosis not present

## 2018-02-05 DIAGNOSIS — R1011 Right upper quadrant pain: Secondary | ICD-10-CM

## 2018-02-05 DIAGNOSIS — R112 Nausea with vomiting, unspecified: Secondary | ICD-10-CM

## 2018-02-05 DIAGNOSIS — Z7901 Long term (current) use of anticoagulants: Secondary | ICD-10-CM | POA: Diagnosis not present

## 2018-02-06 ENCOUNTER — Telehealth: Payer: Self-pay | Admitting: Cardiovascular Disease

## 2018-02-06 DIAGNOSIS — Z7984 Long term (current) use of oral hypoglycemic drugs: Secondary | ICD-10-CM | POA: Diagnosis not present

## 2018-02-06 DIAGNOSIS — E7800 Pure hypercholesterolemia, unspecified: Secondary | ICD-10-CM | POA: Diagnosis not present

## 2018-02-06 DIAGNOSIS — K8020 Calculus of gallbladder without cholecystitis without obstruction: Secondary | ICD-10-CM | POA: Diagnosis not present

## 2018-02-06 DIAGNOSIS — I4891 Unspecified atrial fibrillation: Secondary | ICD-10-CM | POA: Diagnosis not present

## 2018-02-06 DIAGNOSIS — I10 Essential (primary) hypertension: Secondary | ICD-10-CM | POA: Diagnosis not present

## 2018-02-06 DIAGNOSIS — D6489 Other specified anemias: Secondary | ICD-10-CM | POA: Diagnosis not present

## 2018-02-06 DIAGNOSIS — D649 Anemia, unspecified: Secondary | ICD-10-CM | POA: Diagnosis not present

## 2018-02-06 DIAGNOSIS — I2510 Atherosclerotic heart disease of native coronary artery without angina pectoris: Secondary | ICD-10-CM | POA: Diagnosis not present

## 2018-02-06 DIAGNOSIS — Z952 Presence of prosthetic heart valve: Secondary | ICD-10-CM | POA: Diagnosis not present

## 2018-02-06 DIAGNOSIS — E1151 Type 2 diabetes mellitus with diabetic peripheral angiopathy without gangrene: Secondary | ICD-10-CM | POA: Diagnosis not present

## 2018-02-06 NOTE — Telephone Encounter (Signed)
Spoke with patient and she is still feeling weak and not eating well. She does not have HHN but has PT starting tomorrow.  Scheduled follow up INR on 02/08/18. She will call back if she is unable to make appointment.

## 2018-02-06 NOTE — Telephone Encounter (Signed)
New Message  If Home Health RN is calling please get Coumadin Nurse on the phone STAT  1.  Are you calling in regards to an appointment? yes  2.  Are you calling for a refill ? no  3.  Are you having bleeding issues? no  4.  Do you need clearance to hold Coumadin? no  Pt states that she has been in the hospital and hasn't been able to come in for an appt and still feels weak. Please call  Please route to the Coumadin Clinic Pool

## 2018-02-08 ENCOUNTER — Ambulatory Visit (INDEPENDENT_AMBULATORY_CARE_PROVIDER_SITE_OTHER): Payer: Medicare HMO | Admitting: Pharmacist Clinician (PhC)/ Clinical Pharmacy Specialist

## 2018-02-08 DIAGNOSIS — Z952 Presence of prosthetic heart valve: Secondary | ICD-10-CM

## 2018-02-08 DIAGNOSIS — I482 Chronic atrial fibrillation, unspecified: Secondary | ICD-10-CM

## 2018-02-08 DIAGNOSIS — Z7901 Long term (current) use of anticoagulants: Secondary | ICD-10-CM | POA: Diagnosis not present

## 2018-02-08 LAB — POCT INR: INR: 2

## 2018-02-08 NOTE — Patient Instructions (Signed)
Description   Take 2 tablets today Friday May 3, then continue with 1 tablet daily except 1.5 tablets each Monday. Wednesday and Friday.  Repeat INR in 2 weeks.

## 2018-02-13 DIAGNOSIS — K8020 Calculus of gallbladder without cholecystitis without obstruction: Secondary | ICD-10-CM | POA: Diagnosis not present

## 2018-02-13 DIAGNOSIS — I2510 Atherosclerotic heart disease of native coronary artery without angina pectoris: Secondary | ICD-10-CM | POA: Diagnosis not present

## 2018-02-13 DIAGNOSIS — I4891 Unspecified atrial fibrillation: Secondary | ICD-10-CM | POA: Diagnosis not present

## 2018-02-13 DIAGNOSIS — Z952 Presence of prosthetic heart valve: Secondary | ICD-10-CM | POA: Diagnosis not present

## 2018-02-13 DIAGNOSIS — D649 Anemia, unspecified: Secondary | ICD-10-CM | POA: Diagnosis not present

## 2018-02-13 DIAGNOSIS — E7800 Pure hypercholesterolemia, unspecified: Secondary | ICD-10-CM | POA: Diagnosis not present

## 2018-02-13 DIAGNOSIS — I10 Essential (primary) hypertension: Secondary | ICD-10-CM | POA: Diagnosis not present

## 2018-02-13 DIAGNOSIS — E1151 Type 2 diabetes mellitus with diabetic peripheral angiopathy without gangrene: Secondary | ICD-10-CM | POA: Diagnosis not present

## 2018-02-13 DIAGNOSIS — Z7984 Long term (current) use of oral hypoglycemic drugs: Secondary | ICD-10-CM | POA: Diagnosis not present

## 2018-02-15 ENCOUNTER — Ambulatory Visit (HOSPITAL_COMMUNITY)
Admission: RE | Admit: 2018-02-15 | Discharge: 2018-02-15 | Disposition: A | Discharge status: Home / Self Care | Payer: Medicare HMO | Source: Ambulatory Visit | Attending: Gastroenterology | Admitting: Gastroenterology

## 2018-02-15 ENCOUNTER — Other Ambulatory Visit (HOSPITAL_COMMUNITY): Payer: Self-pay | Admitting: Gastroenterology

## 2018-02-15 DIAGNOSIS — R112 Nausea with vomiting, unspecified: Secondary | ICD-10-CM

## 2018-02-15 DIAGNOSIS — R1011 Right upper quadrant pain: Secondary | ICD-10-CM | POA: Diagnosis not present

## 2018-02-15 DIAGNOSIS — Z952 Presence of prosthetic heart valve: Secondary | ICD-10-CM | POA: Diagnosis not present

## 2018-02-15 DIAGNOSIS — I4891 Unspecified atrial fibrillation: Secondary | ICD-10-CM | POA: Diagnosis not present

## 2018-02-15 DIAGNOSIS — D649 Anemia, unspecified: Secondary | ICD-10-CM | POA: Diagnosis not present

## 2018-02-15 DIAGNOSIS — K8020 Calculus of gallbladder without cholecystitis without obstruction: Secondary | ICD-10-CM | POA: Diagnosis not present

## 2018-02-15 DIAGNOSIS — I2510 Atherosclerotic heart disease of native coronary artery without angina pectoris: Secondary | ICD-10-CM | POA: Diagnosis not present

## 2018-02-15 DIAGNOSIS — Z7984 Long term (current) use of oral hypoglycemic drugs: Secondary | ICD-10-CM | POA: Diagnosis not present

## 2018-02-15 DIAGNOSIS — E7800 Pure hypercholesterolemia, unspecified: Secondary | ICD-10-CM | POA: Diagnosis not present

## 2018-02-15 DIAGNOSIS — I10 Essential (primary) hypertension: Secondary | ICD-10-CM | POA: Diagnosis not present

## 2018-02-15 DIAGNOSIS — E1151 Type 2 diabetes mellitus with diabetic peripheral angiopathy without gangrene: Secondary | ICD-10-CM | POA: Diagnosis not present

## 2018-02-15 MED ORDER — MORPHINE SULFATE (PF) 4 MG/ML IV SOLN
3.0000 mg | Freq: Once | INTRAVENOUS | Status: AC
  Administered 2018-02-15: 3 mg via INTRAVENOUS

## 2018-02-15 MED ORDER — TECHNETIUM TC 99M MEBROFENIN IV KIT
5.0000 | PACK | Freq: Once | INTRAVENOUS | Status: AC | PRN
  Administered 2018-02-15: 5 via INTRAVENOUS

## 2018-02-15 MED ORDER — MORPHINE SULFATE (PF) 4 MG/ML IV SOLN
INTRAVENOUS | Status: AC
  Filled 2018-02-15: qty 1

## 2018-02-18 DIAGNOSIS — I480 Paroxysmal atrial fibrillation: Secondary | ICD-10-CM | POA: Diagnosis not present

## 2018-02-18 DIAGNOSIS — J811 Chronic pulmonary edema: Secondary | ICD-10-CM | POA: Diagnosis not present

## 2018-02-18 DIAGNOSIS — E871 Hypo-osmolality and hyponatremia: Secondary | ICD-10-CM | POA: Diagnosis not present

## 2018-02-18 DIAGNOSIS — D6489 Other specified anemias: Secondary | ICD-10-CM | POA: Diagnosis not present

## 2018-02-18 DIAGNOSIS — E1151 Type 2 diabetes mellitus with diabetic peripheral angiopathy without gangrene: Secondary | ICD-10-CM | POA: Diagnosis not present

## 2018-02-18 DIAGNOSIS — Z952 Presence of prosthetic heart valve: Secondary | ICD-10-CM | POA: Diagnosis not present

## 2018-02-18 DIAGNOSIS — I10 Essential (primary) hypertension: Secondary | ICD-10-CM | POA: Diagnosis not present

## 2018-02-18 DIAGNOSIS — R110 Nausea: Secondary | ICD-10-CM | POA: Diagnosis not present

## 2018-02-18 DIAGNOSIS — R197 Diarrhea, unspecified: Secondary | ICD-10-CM | POA: Diagnosis not present

## 2018-02-20 DIAGNOSIS — K8020 Calculus of gallbladder without cholecystitis without obstruction: Secondary | ICD-10-CM | POA: Diagnosis not present

## 2018-02-20 DIAGNOSIS — I4891 Unspecified atrial fibrillation: Secondary | ICD-10-CM | POA: Diagnosis not present

## 2018-02-20 DIAGNOSIS — D649 Anemia, unspecified: Secondary | ICD-10-CM | POA: Diagnosis not present

## 2018-02-20 DIAGNOSIS — Z952 Presence of prosthetic heart valve: Secondary | ICD-10-CM | POA: Diagnosis not present

## 2018-02-20 DIAGNOSIS — E1151 Type 2 diabetes mellitus with diabetic peripheral angiopathy without gangrene: Secondary | ICD-10-CM | POA: Diagnosis not present

## 2018-02-20 DIAGNOSIS — E7800 Pure hypercholesterolemia, unspecified: Secondary | ICD-10-CM | POA: Diagnosis not present

## 2018-02-20 DIAGNOSIS — I10 Essential (primary) hypertension: Secondary | ICD-10-CM | POA: Diagnosis not present

## 2018-02-20 DIAGNOSIS — I2510 Atherosclerotic heart disease of native coronary artery without angina pectoris: Secondary | ICD-10-CM | POA: Diagnosis not present

## 2018-02-20 DIAGNOSIS — Z7984 Long term (current) use of oral hypoglycemic drugs: Secondary | ICD-10-CM | POA: Diagnosis not present

## 2018-02-21 ENCOUNTER — Other Ambulatory Visit: Payer: Self-pay

## 2018-02-21 ENCOUNTER — Inpatient Hospital Stay (HOSPITAL_COMMUNITY)
Admission: EM | Admit: 2018-02-21 | Discharge: 2018-03-05 | DRG: 417 | Disposition: A | Discharge status: Home Health | Payer: Medicare HMO | Attending: Family Medicine | Admitting: Family Medicine

## 2018-02-21 ENCOUNTER — Emergency Department (HOSPITAL_COMMUNITY): Payer: Medicare HMO

## 2018-02-21 ENCOUNTER — Encounter (HOSPITAL_COMMUNITY): Payer: Self-pay | Admitting: Emergency Medicine

## 2018-02-21 DIAGNOSIS — I5032 Chronic diastolic (congestive) heart failure: Secondary | ICD-10-CM

## 2018-02-21 DIAGNOSIS — R197 Diarrhea, unspecified: Secondary | ICD-10-CM | POA: Diagnosis present

## 2018-02-21 DIAGNOSIS — D62 Acute posthemorrhagic anemia: Secondary | ICD-10-CM | POA: Diagnosis not present

## 2018-02-21 DIAGNOSIS — E119 Type 2 diabetes mellitus without complications: Secondary | ICD-10-CM

## 2018-02-21 DIAGNOSIS — I10 Essential (primary) hypertension: Secondary | ICD-10-CM

## 2018-02-21 DIAGNOSIS — D509 Iron deficiency anemia, unspecified: Secondary | ICD-10-CM | POA: Diagnosis present

## 2018-02-21 DIAGNOSIS — E871 Hypo-osmolality and hyponatremia: Secondary | ICD-10-CM | POA: Diagnosis not present

## 2018-02-21 DIAGNOSIS — K8020 Calculus of gallbladder without cholecystitis without obstruction: Secondary | ICD-10-CM | POA: Diagnosis present

## 2018-02-21 DIAGNOSIS — F419 Anxiety disorder, unspecified: Secondary | ICD-10-CM

## 2018-02-21 DIAGNOSIS — I445 Left posterior fascicular block: Secondary | ICD-10-CM | POA: Diagnosis not present

## 2018-02-21 DIAGNOSIS — E7800 Pure hypercholesterolemia, unspecified: Secondary | ICD-10-CM | POA: Diagnosis not present

## 2018-02-21 DIAGNOSIS — K810 Acute cholecystitis: Secondary | ICD-10-CM | POA: Diagnosis not present

## 2018-02-21 DIAGNOSIS — K5730 Diverticulosis of large intestine without perforation or abscess without bleeding: Secondary | ICD-10-CM | POA: Diagnosis present

## 2018-02-21 DIAGNOSIS — Z951 Presence of aortocoronary bypass graft: Secondary | ICD-10-CM | POA: Diagnosis not present

## 2018-02-21 DIAGNOSIS — N390 Urinary tract infection, site not specified: Secondary | ICD-10-CM | POA: Diagnosis present

## 2018-02-21 DIAGNOSIS — Z7982 Long term (current) use of aspirin: Secondary | ICD-10-CM

## 2018-02-21 DIAGNOSIS — I110 Hypertensive heart disease with heart failure: Secondary | ICD-10-CM | POA: Diagnosis present

## 2018-02-21 DIAGNOSIS — K819 Cholecystitis, unspecified: Secondary | ICD-10-CM | POA: Diagnosis present

## 2018-02-21 DIAGNOSIS — K219 Gastro-esophageal reflux disease without esophagitis: Secondary | ICD-10-CM | POA: Diagnosis not present

## 2018-02-21 DIAGNOSIS — T373X5A Adverse effect of other antiprotozoal drugs, initial encounter: Secondary | ICD-10-CM | POA: Diagnosis present

## 2018-02-21 DIAGNOSIS — Z7901 Long term (current) use of anticoagulants: Secondary | ICD-10-CM | POA: Diagnosis not present

## 2018-02-21 DIAGNOSIS — R531 Weakness: Secondary | ICD-10-CM | POA: Diagnosis present

## 2018-02-21 DIAGNOSIS — E1151 Type 2 diabetes mellitus with diabetic peripheral angiopathy without gangrene: Secondary | ICD-10-CM | POA: Diagnosis present

## 2018-02-21 DIAGNOSIS — R0602 Shortness of breath: Secondary | ICD-10-CM

## 2018-02-21 DIAGNOSIS — R21 Rash and other nonspecific skin eruption: Secondary | ICD-10-CM | POA: Diagnosis present

## 2018-02-21 DIAGNOSIS — I4891 Unspecified atrial fibrillation: Secondary | ICD-10-CM | POA: Diagnosis not present

## 2018-02-21 DIAGNOSIS — I272 Pulmonary hypertension, unspecified: Secondary | ICD-10-CM | POA: Diagnosis present

## 2018-02-21 DIAGNOSIS — Z888 Allergy status to other drugs, medicaments and biological substances status: Secondary | ICD-10-CM

## 2018-02-21 DIAGNOSIS — I2510 Atherosclerotic heart disease of native coronary artery without angina pectoris: Secondary | ICD-10-CM | POA: Diagnosis not present

## 2018-02-21 DIAGNOSIS — I481 Persistent atrial fibrillation: Secondary | ICD-10-CM | POA: Diagnosis not present

## 2018-02-21 DIAGNOSIS — R1011 Right upper quadrant pain: Secondary | ICD-10-CM | POA: Diagnosis not present

## 2018-02-21 DIAGNOSIS — K8000 Calculus of gallbladder with acute cholecystitis without obstruction: Principal | ICD-10-CM | POA: Diagnosis present

## 2018-02-21 DIAGNOSIS — R1013 Epigastric pain: Secondary | ICD-10-CM | POA: Diagnosis not present

## 2018-02-21 DIAGNOSIS — J9601 Acute respiratory failure with hypoxia: Secondary | ICD-10-CM | POA: Diagnosis not present

## 2018-02-21 DIAGNOSIS — Z952 Presence of prosthetic heart valve: Secondary | ICD-10-CM | POA: Diagnosis not present

## 2018-02-21 DIAGNOSIS — Z87891 Personal history of nicotine dependence: Secondary | ICD-10-CM

## 2018-02-21 DIAGNOSIS — E785 Hyperlipidemia, unspecified: Secondary | ICD-10-CM | POA: Diagnosis present

## 2018-02-21 DIAGNOSIS — Z7984 Long term (current) use of oral hypoglycemic drugs: Secondary | ICD-10-CM | POA: Diagnosis not present

## 2018-02-21 DIAGNOSIS — I482 Chronic atrial fibrillation: Secondary | ICD-10-CM | POA: Diagnosis present

## 2018-02-21 DIAGNOSIS — R079 Chest pain, unspecified: Secondary | ICD-10-CM | POA: Diagnosis not present

## 2018-02-21 DIAGNOSIS — R161 Splenomegaly, not elsewhere classified: Secondary | ICD-10-CM | POA: Diagnosis present

## 2018-02-21 DIAGNOSIS — L270 Generalized skin eruption due to drugs and medicaments taken internally: Secondary | ICD-10-CM | POA: Diagnosis not present

## 2018-02-21 DIAGNOSIS — Z01810 Encounter for preprocedural cardiovascular examination: Secondary | ICD-10-CM | POA: Diagnosis not present

## 2018-02-21 DIAGNOSIS — I700 Atherosclerosis of aorta: Secondary | ICD-10-CM | POA: Diagnosis present

## 2018-02-21 DIAGNOSIS — R109 Unspecified abdominal pain: Secondary | ICD-10-CM

## 2018-02-21 DIAGNOSIS — K8010 Calculus of gallbladder with chronic cholecystitis without obstruction: Secondary | ICD-10-CM | POA: Diagnosis not present

## 2018-02-21 DIAGNOSIS — I340 Nonrheumatic mitral (valve) insufficiency: Secondary | ICD-10-CM | POA: Diagnosis not present

## 2018-02-21 LAB — CBC
HEMATOCRIT: 26.5 % — AB (ref 36.0–46.0)
Hemoglobin: 8 g/dL — ABNORMAL LOW (ref 12.0–15.0)
MCH: 22.2 pg — AB (ref 26.0–34.0)
MCHC: 30.2 g/dL (ref 30.0–36.0)
MCV: 73.6 fL — AB (ref 78.0–100.0)
Platelets: 298 10*3/uL (ref 150–400)
RBC: 3.6 MIL/uL — AB (ref 3.87–5.11)
RDW: 17.2 % — AB (ref 11.5–15.5)
WBC: 4.7 10*3/uL (ref 4.0–10.5)

## 2018-02-21 LAB — COMPREHENSIVE METABOLIC PANEL
ALT: 17 U/L (ref 14–54)
AST: 32 U/L (ref 15–41)
Albumin: 2.6 g/dL — ABNORMAL LOW (ref 3.5–5.0)
Alkaline Phosphatase: 89 U/L (ref 38–126)
Anion gap: 9 (ref 5–15)
BUN: 15 mg/dL (ref 6–20)
CHLORIDE: 104 mmol/L (ref 101–111)
CO2: 20 mmol/L — ABNORMAL LOW (ref 22–32)
Calcium: 8.5 mg/dL — ABNORMAL LOW (ref 8.9–10.3)
Creatinine, Ser: 0.97 mg/dL (ref 0.44–1.00)
GFR calc Af Amer: >60 mL/min (ref >60)
GFR, EST NON AFRICAN AMERICAN: 54 mL/min — AB (ref >60)
Glucose, Bld: 90 mg/dL (ref 65–99)
POTASSIUM: 4.4 mmol/L (ref 3.5–5.1)
Sodium: 133 mmol/L — ABNORMAL LOW (ref 135–145)
Total Bilirubin: 0.8 mg/dL (ref 0.3–1.2)
Total Protein: 7.2 g/dL (ref 6.5–8.1)

## 2018-02-21 LAB — POC OCCULT BLOOD, ED: Fecal Occult Bld: NEGATIVE

## 2018-02-21 LAB — LIPASE, BLOOD: Lipase: 47 U/L (ref 11–51)

## 2018-02-21 MED ORDER — ALPRAZOLAM 0.25 MG PO TABS
0.2500 mg | ORAL_TABLET | Freq: Two times a day (BID) | ORAL | Status: DC | PRN
  Filled 2018-02-21: qty 2

## 2018-02-21 MED ORDER — VITAMIN D 1000 UNITS PO TABS
1000.0000 [IU] | ORAL_TABLET | Freq: Every day | ORAL | Status: DC
  Administered 2018-02-22 – 2018-03-05 (×11): 1000 [IU] via ORAL
  Filled 2018-02-21 (×12): qty 1

## 2018-02-21 MED ORDER — HYDROCORTISONE 1 % EX CREA
TOPICAL_CREAM | Freq: Two times a day (BID) | CUTANEOUS | Status: DC
  Administered 2018-02-22 – 2018-02-24 (×6): via TOPICAL
  Filled 2018-02-21: qty 28

## 2018-02-21 MED ORDER — HYDRALAZINE HCL 50 MG PO TABS
100.0000 mg | ORAL_TABLET | Freq: Three times a day (TID) | ORAL | Status: DC
  Administered 2018-02-22 – 2018-03-02 (×23): 100 mg via ORAL
  Administered 2018-03-02: 50 mg via ORAL
  Administered 2018-03-03 – 2018-03-05 (×7): 100 mg via ORAL
  Filled 2018-02-21 (×34): qty 2

## 2018-02-21 MED ORDER — PANTOPRAZOLE SODIUM 40 MG PO TBEC
40.0000 mg | DELAYED_RELEASE_TABLET | Freq: Every day | ORAL | Status: DC
  Administered 2018-02-22 – 2018-03-05 (×12): 40 mg via ORAL
  Filled 2018-02-21 (×12): qty 1

## 2018-02-21 MED ORDER — ACETAMINOPHEN 325 MG PO TABS: 650.0000 mg | ORAL_TABLET | Freq: Four times a day (QID) | ORAL | Status: DC | PRN

## 2018-02-21 MED ORDER — MORPHINE SULFATE (PF) 4 MG/ML IV SOLN
2.0000 mg | Freq: Once | INTRAVENOUS | Status: AC
  Administered 2018-02-21: 2 mg via INTRAVENOUS
  Filled 2018-02-21: qty 1

## 2018-02-21 MED ORDER — ASPIRIN EC 81 MG PO TBEC
81.0000 mg | DELAYED_RELEASE_TABLET | ORAL | Status: DC
  Administered 2018-02-22 – 2018-03-04 (×5): 81 mg via ORAL
  Filled 2018-02-21 (×8): qty 1

## 2018-02-21 MED ORDER — VITAMIN B-12 100 MCG PO TABS
100.0000 ug | ORAL_TABLET | Freq: Every day | ORAL | Status: DC
  Administered 2018-02-22 – 2018-03-05 (×11): 100 ug via ORAL
  Filled 2018-02-21 (×12): qty 1

## 2018-02-21 MED ORDER — ONDANSETRON HCL 4 MG/2ML IJ SOLN
4.0000 mg | Freq: Three times a day (TID) | INTRAMUSCULAR | Status: DC | PRN
  Administered 2018-02-23 – 2018-03-04 (×5): 4 mg via INTRAVENOUS
  Filled 2018-02-21 (×7): qty 2

## 2018-02-21 MED ORDER — SODIUM CHLORIDE 0.9 % IV BOLUS
500.0000 mL | Freq: Once | INTRAVENOUS | Status: AC
  Administered 2018-02-21: 500 mL via INTRAVENOUS

## 2018-02-21 MED ORDER — METOPROLOL TARTRATE 50 MG PO TABS
50.0000 mg | ORAL_TABLET | Freq: Two times a day (BID) | ORAL | Status: DC
  Administered 2018-02-22 – 2018-03-05 (×24): 50 mg via ORAL
  Filled 2018-02-21 (×24): qty 1

## 2018-02-21 MED ORDER — IOHEXOL 300 MG/ML  SOLN
100.0000 mL | Freq: Once | INTRAMUSCULAR | Status: AC | PRN
  Administered 2018-02-21: 100 mL via INTRAVENOUS

## 2018-02-21 MED ORDER — SODIUM CHLORIDE 0.9 % IV SOLN
1.0000 g | Freq: Once | INTRAVENOUS | Status: AC
  Administered 2018-02-22: 1 g via INTRAVENOUS
  Filled 2018-02-21: qty 10

## 2018-02-21 MED ORDER — ATORVASTATIN CALCIUM 80 MG PO TABS
80.0000 mg | ORAL_TABLET | Freq: Every day | ORAL | Status: DC
  Administered 2018-02-22 – 2018-03-04 (×12): 80 mg via ORAL
  Filled 2018-02-21 (×12): qty 1

## 2018-02-21 MED ORDER — MAGNESIUM OXIDE 400 (241.3 MG) MG PO TABS
400.0000 mg | ORAL_TABLET | Freq: Every day | ORAL | Status: DC
  Administered 2018-02-22 – 2018-03-05 (×11): 400 mg via ORAL
  Filled 2018-02-21 (×12): qty 1

## 2018-02-21 MED ORDER — SODIUM CHLORIDE 0.9 % IV SOLN
2.0000 g | INTRAVENOUS | Status: DC
  Administered 2018-02-22 – 2018-02-24 (×3): 2 g via INTRAVENOUS
  Filled 2018-02-21 (×3): qty 20

## 2018-02-21 MED ORDER — FUROSEMIDE 40 MG PO TABS
60.0000 mg | ORAL_TABLET | Freq: Every day | ORAL | Status: DC
  Administered 2018-02-22 – 2018-02-24 (×3): 60 mg via ORAL
  Filled 2018-02-21 (×3): qty 1

## 2018-02-21 MED ORDER — MORPHINE SULFATE (PF) 4 MG/ML IV SOLN
1.0000 mg | INTRAVENOUS | Status: DC | PRN
  Administered 2018-02-23 – 2018-03-05 (×6): 1 mg via INTRAVENOUS
  Filled 2018-02-21 (×7): qty 1

## 2018-02-21 MED ORDER — CEFTRIAXONE SODIUM 1 G IJ SOLR
1.0000 g | Freq: Once | INTRAMUSCULAR | Status: AC
  Administered 2018-02-21: 1 g via INTRAVENOUS
  Filled 2018-02-21: qty 10

## 2018-02-21 MED ORDER — ZOLPIDEM TARTRATE 5 MG PO TABS: 5.0000 mg | ORAL_TABLET | Freq: Every evening | ORAL | Status: DC | PRN

## 2018-02-21 MED ORDER — IRBESARTAN 150 MG PO TABS
300.0000 mg | ORAL_TABLET | Freq: Every day | ORAL | Status: DC
  Administered 2018-02-22 – 2018-03-05 (×12): 300 mg via ORAL
  Filled 2018-02-21: qty 2
  Filled 2018-02-21: qty 1
  Filled 2018-02-21 (×5): qty 2
  Filled 2018-02-21: qty 1
  Filled 2018-02-21: qty 2
  Filled 2018-02-21 (×2): qty 1
  Filled 2018-02-21: qty 2
  Filled 2018-02-21: qty 1
  Filled 2018-02-21: qty 2
  Filled 2018-02-21: qty 1
  Filled 2018-02-21: qty 2
  Filled 2018-02-21: qty 1
  Filled 2018-02-21 (×2): qty 2
  Filled 2018-02-21: qty 1

## 2018-02-21 MED ORDER — ALPRAZOLAM 0.25 MG PO TABS
0.2500 mg | ORAL_TABLET | Freq: Every day | ORAL | Status: DC
  Administered 2018-02-22 – 2018-03-01 (×9): 0.5 mg via ORAL
  Administered 2018-03-02 – 2018-03-04 (×3): 0.25 mg via ORAL
  Filled 2018-02-21 (×3): qty 2
  Filled 2018-02-21: qty 1
  Filled 2018-02-21 (×4): qty 2
  Filled 2018-02-21 (×2): qty 1
  Filled 2018-02-21: qty 2

## 2018-02-21 MED ORDER — ONDANSETRON HCL 4 MG/2ML IJ SOLN
4.0000 mg | Freq: Once | INTRAMUSCULAR | Status: AC
  Administered 2018-02-21: 4 mg via INTRAVENOUS
  Filled 2018-02-21: qty 2

## 2018-02-21 MED ORDER — INSULIN ASPART 100 UNIT/ML ~~LOC~~ SOLN
0.0000 [IU] | Freq: Three times a day (TID) | SUBCUTANEOUS | Status: DC
  Administered 2018-02-26 (×2): 1 [IU] via SUBCUTANEOUS
  Administered 2018-02-27 (×2): 2 [IU] via SUBCUTANEOUS
  Administered 2018-03-02 – 2018-03-03 (×5): 1 [IU] via SUBCUTANEOUS
  Administered 2018-03-04: 3 [IU] via SUBCUTANEOUS

## 2018-02-21 MED ORDER — INSULIN ASPART 100 UNIT/ML ~~LOC~~ SOLN: 0.0000 [IU] | Freq: Every day | SUBCUTANEOUS | Status: DC

## 2018-02-21 MED ORDER — POLYVINYL ALCOHOL 1.4 % OP SOLN: 1.0000 [drp] | Freq: Three times a day (TID) | OPHTHALMIC | Status: DC | PRN

## 2018-02-21 MED ORDER — TRAMADOL HCL 50 MG PO TABS
50.0000 mg | ORAL_TABLET | Freq: Four times a day (QID) | ORAL | Status: DC | PRN
Start: 2018-02-21 — End: 2018-03-01
  Administered 2018-02-22 – 2018-02-28 (×3): 50 mg via ORAL
  Filled 2018-02-21 (×3): qty 1

## 2018-02-21 MED ORDER — HYDRALAZINE HCL 20 MG/ML IJ SOLN
5.0000 mg | INTRAMUSCULAR | Status: DC | PRN
  Administered 2018-02-27: 5 mg via INTRAVENOUS
  Filled 2018-02-21 (×2): qty 1

## 2018-02-21 NOTE — ED Triage Notes (Signed)
Pt c/o nausea/vomiting x 3 mos, states it got worse last night and pt started having diarrhea. Pt has a rash to her head and feet, c/o back pain with movement.

## 2018-02-21 NOTE — ED Provider Notes (Signed)
[facility] DEPARTMENT Provider Note   CSN: [id number] Arrival date & time: 02/21/18  1440     History   Chief Complaint Chief Complaint  Patient presents with  . Diarrhea  . Back Pain  . Weakness    HPI [name] is a 77 y.o. female with history of aortic atherosclerosis, atrial fibrillation, CAD, diabetes mellitus, HLD, HTN, status post mechanical heart valve replacement on chronic anticoagulation, peripheral arterial disease presents today yesterday.  She has had 4 doses of the Flagyl.  She has since discontinued to the for evaluation of gradual onset, per aggressively worsening fatigue and diarrhea for 6 days.  She notes at least 3-4 episodes of watery nonbloody diarrhea daily since Friday.  She notes intermittent right upper quadrant abdominal pain which is aching in nature and does not radiate.  She states this pain sometimes worsens after meals.  No alleviating factors noted.  She denies melena or hematochezia.  She notes subjective fevers and chills.  She did have one episode of emesis yesterday and tells me it was dark in color but cannot tell me if it was bloody or bilious.  She is scheduled to see [facility] surgery tomorrow for discussion of possible cholecystectomy. She underwent HIDA scan on Friday 1 week ago with Dr. Alessandra Bevels and was told that she had a "collapsed gallbladder that was nonfunctional ".   She was seen and evaluated at her primary care physician's office on Monday and was started on Flagyl 3 times daily for possible C. difficile.  She started taking this medicine on Tuesday and developed a rash medication since the rash developed yesterday.  She has not had Flagyl in the past to her knowledge.  The rash is mildly pruritic around her scalp.  She has been taking Benadryl without significant relief.  Denies swelling of the lips or tongue, throat tightness, or shortness of breath.  No drooling.  The history is provided by the  patient.    Past Medical History:  Diagnosis Date  . Aortic atherosclerosis ([place]) 01/23/2018  . Atrial fibrillation ([place])   . Carotid artery disease ([place])   . Cholelithiasis 01/23/2018  . Chronic anticoagulation   . Coronary artery disease    status post coronary artery bypass grafting times 07/10/2004  . Diabetes mellitus   . Hypercholesteremia   . Hypertension   . Mechanical heart valve present    H. aortic valve replacement at the time of bypass surgery October 2005  . Peripheral arterial disease ([facility])    history of left common iliac artery PTA and stenting for a chronic total occlusion 08/26/01    Patient Active Problem List   Diagnosis Date Noted  . Cholecystitis 02/21/2018  . GERD (gastroesophageal reflux disease) 02/21/2018  . Anxiety 02/21/2018  . Chronic diastolic CHF (congestive heart failure) ([place]) 02/21/2018  . Diarrhea 02/21/2018  . Rash 02/21/2018  . General weakness 02/02/2018  . Macular rash 01/25/2018  . Thrombocytopenia ([place]) 01/25/2018  . Arm pain, diffuse 01/25/2018  . Symptomatic anemia 01/23/2018  . Cholelithiasis 01/23/2018  . Aortic atherosclerosis ([place]) 01/23/2018  . GI bleed 01/18/2018  . Atrial fibrillation ([place]) 01/12/2016  . Coronary artery disease 08/18/2013  . Carotid artery disease ([place]) 08/18/2013  . Peripheral arterial disease ([place]) 08/18/2013  . Diabetes mellitus without complication ([place]) 37/07/6268  . Benign positional vertigo 10/06/2011  . Hyponatremia 10/06/2011  . Influenza 10/06/2011  . Sinusitis 10/06/2011  . Hypertension   . Hypercholesteremia   . Mechanical  heart valve present   . Chronic anticoagulation     Past Surgical History:  Procedure Laterality Date  . AORTIC VALVE REPLACEMENT    . CARDIAC CATHETERIZATION  11/10/2004   40% right common illiac, 70% in stent restenosis of distal left common illiac,   . CARDIAC CATHETERIZATION  05/18/2004   LAD 50-70% midstenosis, RCA dominant w/50% stenosis, 50% Right common  Illiac artery ostial stenosis, 90% in stent restenosis within midportion of left common illiac stent  . Carotid Duplex  03/12/2012   RSA-elev. velocities suggestive of a 50-69% diameter reduction, Right&Left Bulb/Prox ICA-mild-mod.fibrous plaqueelevating Velocities abnormal study.  . COLONOSCOPY WITH PROPOFOL N/A 01/22/2018   Procedure: COLONOSCOPY WITH PROPOFOL;  Surgeon: Wilford Corner, MD;  Location: [facility];  Service: Endoscopy;  Laterality: N/A;  . ESOPHAGOGASTRODUODENOSCOPY (EGD) WITH PROPOFOL N/A 01/22/2018   Procedure: ESOPHAGOGASTRODUODENOSCOPY (EGD) WITH PROPOFOL;  Surgeon: Wilford Corner, MD;  Location: [facility];  Service: Endoscopy;  Laterality: N/A;  . Lower Ext. Duplex  03/12/2012   Right Proximal CIA- vessel narrowing w/elevated velocities 0-49% diameter reduction. Right SFA-mild mixed density plaque throughout vessel.  [name] NM MYOCAR PERF WALL MOTION  05/19/2010   protocol: Persantine, post stress EF 65%, negative for ischemia, low risk scan  . TRANSTHORACIC ECHOCARDIOGRAM  08/29/2012   Moderately calcified annulus of mitral valve, moderate regurg. of both mitral valve and tricuspid valve.      OB History   None      Home Medications    Prior to Admission medications   Medication Sig Start Date End Date Taking? Authorizing Provider  acetaminophen (TYLENOL) 325 MG tablet Take 325-650 mg by mouth every 6 (six) hours as needed (for pain or headaches).   Yes [provider]  ALPRAZolam Duanne Moron) 0.5 MG tablet Take 0.25-0.5 mg by mouth See admin instructions. Take 0.25-0.5 mg two times a day as needed for anxiety and 0.25-0.5 mg at bedtime scheduled   Yes [provider]  aspirin EC 81 MG tablet Take 81 mg by mouth every Monday, Wednesday, and Friday.    Yes [provider]  atorvastatin (LIPITOR) 80 MG tablet Take 1 tablet (80 mg total) by mouth daily. Patient taking differently: Take 80 mg by mouth daily with supper.  09/25/17  Yes Lorretta Harp, MD  Cholecalciferol (VITAMIN D3) 1000 UNITS CAPS Take 1,000 Units by mouth daily.    Yes [provider]  furosemide (LASIX) 20 MG tablet Take 60 mg by mouth daily.    Yes [provider]  glipiZIDE (GLUCOTROL XL) 2.5 MG 24 hr tablet Take 2.5 mg by mouth daily.  04/04/17  Yes [provider]  hydrALAZINE (APRESOLINE) 100 MG tablet Take 1 tablet (100 mg total) by mouth 3 (three) times daily. 11/07/17  Yes Lorretta Harp, MD  hydrocortisone cream 1 % Apply topically 2 (two) times daily. 01/29/18  Yes [name], MD  irbesartan (AVAPRO) 300 MG tablet Take 1 tablet (300 mg total) by mouth daily. 09/25/17  Yes Lorretta Harp, MD  magnesium oxide (MAG-OX) 400 (241.3 Mg) MG tablet Take 1 tablet (400 mg total) by mouth daily. 01/30/18  Yes [name], MD  metFORMIN (GLUCOPHAGE) 500 MG tablet Take 500 mg by mouth 2 (two) times daily with a meal.    Yes [provider]  metoprolol tartrate (LOPRESSOR) 50 MG tablet Take 50 mg by mouth 2 (two) times daily. 10/16/17  Yes [provider]  metroNIDAZOLE (FLAGYL) 500 MG tablet Take 500 mg by  mouth 3 (three) times daily. 02/18/18  Yes [provider]  ondansetron (ZOFRAN ODT) 4 MG disintegrating tablet 4mg  ODT q6-8 hours prn nausea/vomit Patient taking differently: Take 4 mg by mouth every 8 (eight) hours as needed for nausea.  02/02/18  Yes Muthersbaugh, Jarrett Soho, PA-C  pantoprazole (PROTONIX) 40 MG tablet Take 40 mg by mouth daily.  12/05/14  Yes [provider]  Polyethyl Glyc-Propyl Glyc PF (SYSTANE PRESERVATIVE FREE) 0.4-0.3 % SOLN Place 1-2 drops into both eyes 3 (three) times daily as needed (for irritation).    Yes [provider]  potassium chloride SA (K-DUR,KLOR-CON) 20 MEQ tablet Take 1 tablet (20 mEq total) by mouth daily. 01/30/18  Yes [name], MD  traMADol (ULTRAM) 50 MG tablet Take 50 mg by mouth every 6 (six) hours as needed (for headaches  or pain).    Yes [provider]  vitamin B-12 (CYANOCOBALAMIN) 100 MCG tablet Take 100 mcg by mouth daily.    Yes [provider]  warfarin (COUMADIN) 5 MG tablet TAKE 1 TO 1.5 (ONE TO ONE & ONE-HALF) TABLETS BY MOUTH DAILY AS DIRECTED BY COUMADIN CLINIC Patient taking differently: Take 5 mg by mouth in the morning on Sun/Tues/Thurs/Sat and 7.5 mg on Mon/Wed/Fri 09/10/17  Yes Lorretta Harp, MD    Family History Family History  Problem Relation Age of Onset  . Breast cancer Neg Hx     Social History Social History   Tobacco Use  . Smoking status: Former Smoker    Packs/day: 1.00    Years: 30.00    Pack years: 30.00    Types: Cigarettes  . Smokeless tobacco: Never Used  Substance Use Topics  . Alcohol use: No    Alcohol/week: 0.0 oz  . Drug use: No     Allergies   Flagyl [metronidazole]; Coreg [carvedilol]; Losartan; Spironolactone; Verapamil; Zetia [ezetimibe]; and Zocor [simvastatin - high dose]   Review of Systems Review of Systems  Constitutional: Positive for fatigue. Negative for chills and fever.  Respiratory: Negative for shortness of breath.   Cardiovascular: Negative for chest pain.  Gastrointestinal: Positive for abdominal pain, diarrhea, nausea and vomiting. Negative for blood in stool and constipation.  All other systems reviewed and are negative.    Physical Exam Updated Vital Signs BP 137/60   Pulse (!) 108   Temp 99 F (37.2 C) (Oral)   Resp (!) 30   SpO2 90%   Physical Exam  Constitutional: She appears well-developed and well-nourished. No distress.  Chronically ill appearing  HENT:  Head: Normocephalic and atraumatic.  No swelling of the lips or tongue, tolerating secretions without difficulty.  Airway is patent.  Eyes: Conjunctivae are normal. Right eye exhibits no discharge. Left eye exhibits no discharge.  Neck: Normal range of motion. Neck supple. No JVD present. No tracheal deviation present.  Cardiovascular:    Irregularly irregular rhythm, mildly tachycardic, mechanical click heard on auscultation. 2+ radial and DP/PT pulses bl, 1+ pitting edema of the bilateral lower extremities.  Patient and patient's family state this is chronic and unchanged.   Pulmonary/Chest: Effort normal and breath sounds normal. No stridor. No respiratory distress. She has no wheezes. She has no rales.  Abdominal: Soft. Bowel sounds are normal. She exhibits no distension. There is tenderness in the right upper quadrant, right lower quadrant, epigastric area and periumbilical area. There is guarding. There is no rebound, no CVA tenderness and negative Murphy's sign.  Genitourinary:  Genitourinary Comments: Examination performed in presence of chaperone.  No frank rectal bleeding noted.  Minimal stool in the rectal vault. No fissures or tears noted.  Musculoskeletal: She exhibits no edema.  Neurological: She is alert.  Skin: Skin is warm and dry. Rash noted. No erythema.  See attached images.  Patient has an erythematous maculopapular rash noted to the scalp and face, bilateral upper and lower extremities, back, and chest.  Spares the palms and soles as well as the abdomen.  Psychiatric: She has a normal mood and affect. Her behavior is normal.  Nursing note and vitals reviewed.          ED Treatments / Results  Labs (all labs ordered are listed, but only abnormal results are displayed) Labs Reviewed  COMPREHENSIVE METABOLIC PANEL - Abnormal; Notable for the following components:      Result Value   Sodium 133 (*)    CO2 20 (*)    Calcium 8.5 (*)    Albumin 2.6 (*)    GFR calc non Af Amer 54 (*)    All other components within normal limits  CBC - Abnormal; Notable for the following components:   RBC 3.60 (*)    Hemoglobin 8.0 (*)    HCT 26.5 (*)    MCV 73.6 (*)    MCH 22.2 (*)    RDW 17.2 (*)    All other components within normal limits  URINALYSIS, ROUTINE W REFLEX MICROSCOPIC - Abnormal; Notable for the  following components:   Color, Urine AMBER (*)    APPearance HAZY (*)    Hgb urine dipstick MODERATE (*)    Protein, ur 100 (*)    Leukocytes, UA MODERATE (*)    Bacteria, UA MANY (*)    All other components within normal limits  PROTIME-INR - Abnormal; Notable for the following components:   Prothrombin Time 42.7 (*)    INR 4.54 (*)    All other components within normal limits  APTT - Abnormal; Notable for the following components:   aPTT 82 (*)    All other components within normal limits  BRAIN NATRIURETIC PEPTIDE - Abnormal; Notable for the following components:   B Natriuretic Peptide 690.0 (*)    All other components within normal limits  CULTURE, BLOOD (ROUTINE X 2)  CULTURE, BLOOD (ROUTINE X 2)  C DIFFICILE QUICK SCREEN W PCR REFLEX  GASTROINTESTINAL PANEL BY PCR, STOOL (REPLACES STOOL CULTURE)  LIPASE, BLOOD  LACTIC ACID, PLASMA  PROCALCITONIN  LACTIC ACID, PLASMA  BASIC METABOLIC PANEL  CBC  POC OCCULT BLOOD, ED  TYPE AND SCREEN    EKG EKG Interpretation  Date/Time:  Thursday Feb 21 2018 15:16:02 EDT Ventricular Rate:  98 PR Interval:    QRS Duration: 90 QT Interval:  354 QTC Calculation: 451 R Axis:   120 Text Interpretation:  Atrial fibrillation Left posterior fascicular block Nonspecific T wave abnormality Abnormal ECG Confirmed by Quintella Reichert [phone]) on 02/21/2018 6:22:19 PM   Radiology Ct Abdomen Pelvis W Contrast  Result Date: 02/21/2018 CLINICAL DATA:  Acute onset of diarrhea. Right upper quadrant abdominal pain. EXAM: CT ABDOMEN AND PELVIS WITH CONTRAST TECHNIQUE: Multidetector CT imaging of the abdomen and pelvis was performed using the standard protocol following bolus administration of intravenous contrast. CONTRAST:  152mL OMNIPAQUE IOHEXOL 300 MG/ML  SOLN COMPARISON:  CT of the abdomen and pelvis performed 01/18/2017, and abdominal ultrasound performed 01/17/2018 FINDINGS: Lower chest: Small bilateral pleural effusions are noted, with mild  bibasilar atelectasis. A calcified granuloma is noted at the left lung base. The  heart is enlarged. Scattered coronary artery calcifications are seen. Hepatobiliary: The liver is grossly unremarkable in appearance. Stones are noted within the gallbladder. There is question of mild pericholecystic fluid. The common bile duct is normal in caliber. Pancreas: The pancreas is within normal limits. Spleen: The spleen is enlarged, measuring 15.2 cm in length. Adrenals/Urinary Tract: The adrenal glands are unremarkable in appearance. The kidneys are within normal limits. There is no evidence of hydronephrosis. No renal or ureteral stones are identified. No perinephric stranding is seen. Stomach/Bowel: The stomach is unremarkable in appearance. The small bowel is within normal limits. The appendix is not visualized; there is no evidence for appendicitis. Scattered diverticulosis is noted along the sigmoid colon, without evidence of diverticulitis. Vascular/Lymphatic: Diffuse calcification is seen along the abdominal aorta and its branches. The abdominal aorta is otherwise grossly unremarkable. The inferior vena cava is grossly unremarkable. No retroperitoneal lymphadenopathy is seen. No pelvic sidewall lymphadenopathy is identified. Reproductive: The bladder is moderately distended and contains a small amount of air, possibly reflecting recent Foley catheter placement. The patient is status post hysterectomy. No suspicious adnexal masses are seen. Other: No additional soft tissue abnormalities are seen. Musculoskeletal: No acute osseous abnormalities are identified. The visualized musculature is unremarkable in appearance. IMPRESSION: 1. Cholelithiasis, with question of mild pericholecystic fluid. Right upper quadrant ultrasound could be considered to assess for cholecystitis. 2. Small bilateral pleural effusions, with mild bibasilar atelectasis. 3. Splenomegaly. 4. Scattered coronary artery calcifications seen.   Cardiomegaly. 5. Scattered diverticulosis along the sigmoid colon, without evidence of diverticulitis. Aortic Atherosclerosis (ICD10-I70.0). Electronically Signed   By: Garald Balding M.D.   On: 02/21/2018 21:18    Procedures Procedures (including critical care time)  Medications Ordered in ED Medications  morphine 4 MG/ML injection 1 mg (has no administration in time range)  ondansetron (ZOFRAN) injection 4 mg (has no administration in time range)  acetaminophen (TYLENOL) tablet 650 mg (has no administration in time range)  ALPRAZolam (XANAX) tablet 0.25-0.5 mg (0.5 mg Oral Given 02/22/18 0115)  aspirin EC tablet 81 mg (has no administration in time range)  atorvastatin (LIPITOR) tablet 80 mg (80 mg Oral Given 02/22/18 0114)  cholecalciferol (VITAMIN D) tablet 1,000 Units (has no administration in time range)  furosemide (LASIX) tablet 60 mg (has no administration in time range)  hydrALAZINE (APRESOLINE) tablet 100 mg (100 mg Oral Given 02/22/18 0114)  hydrocortisone cream 1 % (has no administration in time range)  irbesartan (AVAPRO) tablet 300 mg (has no administration in time range)  magnesium oxide (MAG-OX) tablet 400 mg (has no administration in time range)  metoprolol tartrate (LOPRESSOR) tablet 50 mg (50 mg Oral Given 02/22/18 0114)  polyvinyl alcohol (LIQUIFILM TEARS) 1.4 % ophthalmic solution 1-2 drop (has no administration in time range)  traMADol (ULTRAM) tablet 50 mg (has no administration in time range)  vitamin B-12 (CYANOCOBALAMIN) tablet 100 mcg (has no administration in time range)  pantoprazole (PROTONIX) EC tablet 40 mg (has no administration in time range)  cefTRIAXone (ROCEPHIN) 2 g in sodium chloride 0.9 % 100 mL IVPB (has no administration in time range)  ALPRAZolam (XANAX) tablet 0.25-0.5 mg (has no administration in time range)  hydrALAZINE (APRESOLINE) injection 5 mg (has no administration in time range)  zolpidem (AMBIEN) tablet 5 mg (has no administration in time  range)  insulin aspart (novoLOG) injection 0-9 Units (has no administration in time range)  insulin aspart (novoLOG) injection 0-5 Units (0 Units Subcutaneous Not Given 02/22/18 0117)  cefTRIAXone (ROCEPHIN) 1 g  in sodium chloride 0.9 % 100 mL IVPB (has no administration in time range)  sodium chloride 0.9 % bolus 500 mL (0 mLs Intravenous Stopped 02/21/18 1915)  morphine 4 MG/ML injection 2 mg (2 mg Intravenous Given 02/21/18 1849)  ondansetron (ZOFRAN) injection 4 mg (4 mg Intravenous Given 02/21/18 1847)  iohexol (OMNIPAQUE) 300 MG/ML solution 100 mL (100 mLs Intravenous Contrast Given 02/21/18 2048)  cefTRIAXone (ROCEPHIN) 1 g in sodium chloride 0.9 % 100 mL IVPB (0 g Intravenous Stopped 02/21/18 2334)     Initial Impression / Assessment and Plan / ED Course  I have reviewed the triage vital signs and the nursing notes.  Pertinent labs & imaging results that were available during my care of the patient were reviewed by me and considered in my medical decision making (see chart for details).     Patient presents for evaluation of progressively worsening diarrhea, right upper quadrant abdominal pain, generalized fatigue, and vomiting.  She is afebrile, intermittently tachycardic and tachypneic while in the ED.  She appears somewhat uncomfortable and has significant right upper quadrant abdominal pain.  She was scheduled to see general surgery on an outpatient basis tomorrow for the first time to possibly discuss cholecystectomy.  EKG today shows atrial fibrillation, no changes from last tracing.  Lab work reviewed by me shows no significant electrolyte abnormalities; INR is supratherapeutic and her hemoglobin has dropped 1 point in three weeks, but patient is Hemoccult negative  Of note, she did develop a rash after taking metronidazole for the first time.  I do believe this to be a drug rash.  No angioedema or airway compromise noted.  She is tolerating secretions without difficulty.  Patient was  given a fluid bolus, pain medicine, and nausea medicine and states she is feeling somewhat better.  CT scan of the abdomen and pelvis shows cholelithiasis with mild pericholecystic fluid, small bilateral pleural effusions and mild bibasilar atelectasis.  10:10 PM Spoke with Dr. Hulen Skains with general surgery who recommends giving the patient rocephin and he will see the patient for evaluation.  He does recommend medical admission.  10:41 PM Spoke with Dr. Blaine Hamper with [facility] hospitalist service who agrees to assume care of patient and bring her into the hospital for further evaluation and management. Final Clinical Impressions(s) / ED Diagnoses   Final diagnoses:  Cholecystitis, acute  Allergic drug rash    ED Discharge Orders    None       Debroah Baller 02/22/18 0143    Quintella Reichert, MD 02/22/18 1526

## 2018-02-21 NOTE — Consult Note (Signed)
Reason for Consult:Cholelithiasis and acute cholecystitis Referring Physician: [name] Pain is an 77 y.o. female.  HPI: The patient has been illfor over a week with mostly nausea and diarrhea.  She was started on metronidazole for presumptive C.diff, but this was stopped when the patient developed hives.  The diarrhea did improve on the Flagyl, but has come back somewhat but not as bad when it was stopped.   She has never really ahd any abdominal pain until the last couple of hours when she started having sharp RUQ abdominal pain.  A CT scan done earlier showed thickened GB with some pericholecystic fluid, but nto severe.  She has known gallstones and had an appointment with [facility] tomorrow to be seen for potential surgery.  She has been on coumadin for 14 years after an AVR mechanical valve.  Recently she was bridged with heparin for an UGI and colonoscopy doent o help explain chronic anemia.  Her current hemoglobin is 8.0.   Past Medical History:  Diagnosis Date  . Aortic atherosclerosis ([place]) 01/23/2018  . Atrial fibrillation ([place] [facility])   . Carotid artery disease ([place])   . Cholelithiasis 01/23/2018  . Chronic anticoagulation   . Coronary artery disease    status post coronary artery bypass grafting times 07/10/2004  . Diabetes mellitus   . Hypercholesteremia   . Hypertension   . Mechanical heart valve present    H. aortic valve replacement at the time of bypass surgery October 2005  . Peripheral arterial disease ([facility])    history of left common iliac artery PTA and stenting for a chronic total occlusion 08/26/01    Past Surgical History:  Procedure Laterality Date  . AORTIC VALVE REPLACEMENT    . CARDIAC CATHETERIZATION  11/10/2004   40% right common illiac, 70% in stent restenosis of distal left common illiac,   . CARDIAC CATHETERIZATION  05/18/2004   LAD 50-70% midstenosis, RCA dominant w/50% stenosis, 50% Right common Illiac artery ostial stenosis, 90% in stent restenosis within  midportion of left common illiac stent  . Carotid Duplex  03/12/2012   RSA-elev. velocities suggestive of a 50-69% diameter reduction, Right&Left Bulb/Prox ICA-mild-mod.fibrous plaqueelevating Velocities abnormal study.  . COLONOSCOPY WITH PROPOFOL N/A 01/22/2018   Procedure: COLONOSCOPY WITH PROPOFOL;  Surgeon: Wilford Corner, MD;  Location: [facility];  Service: Endoscopy;  Laterality: N/A;  . ESOPHAGOGASTRODUODENOSCOPY (EGD) WITH PROPOFOL N/A 01/22/2018   Procedure: ESOPHAGOGASTRODUODENOSCOPY (EGD) WITH PROPOFOL;  Surgeon: Wilford Corner, MD;  Location: [facility];  Service: Endoscopy;  Laterality: N/A;  . Lower Ext. Duplex  03/12/2012   Right Proximal CIA- vessel narrowing w/elevated velocities 0-49% diameter reduction. Right SFA-mild mixed density plaque throughout vessel.  [name] NM MYOCAR PERF WALL MOTION  05/19/2010   protocol: Persantine, post stress EF 65%, negative for ischemia, low risk scan  . TRANSTHORACIC ECHOCARDIOGRAM  08/29/2012   Moderately calcified annulus of mitral valve, moderate regurg. of both mitral valve and tricuspid valve.     Family History  Problem Relation Age of Onset  . Breast cancer Neg Hx     Social History:  reports that she has quit smoking. Her smoking use included cigarettes. She has a 30.00 pack-year smoking history. She has never used smokeless tobacco. She reports that she does not drink alcohol or use drugs.  Allergies:  Allergies  Allergen Reactions  . Flagyl [Metronidazole] Rash    ALL-OVER BODY RASH  . Coreg [Carvedilol] Other (See Comments)    Terrible cramping in the feet and had a  lot of bowel movements, but not diarrhea  . Losartan Swelling    Patient doesn't recall site of swelling  . Spironolactone Nausea Only    Caused extreme nausea  . Verapamil Hives  . Zetia [Ezetimibe] Other (See Comments)    Reaction not recalled  . Zocor [Simvastatin - High Dose] Other (See Comments)    Reaction not recalled    Medications: I have  reviewed the patient's current medications.  Results for orders placed or performed during the hospital encounter of 02/21/18 (from the past 48 hour(s))  Lipase, blood     Status: None   Collection Time: 02/21/18  3:19 PM  Result Value Ref Range   Lipase 47 11 - 51 U/L    Comment: Performed at [facility], [address], [place], [place] [address]  Comprehensive metabolic panel     Status: Abnormal   Collection Time: 02/21/18  3:19 PM  Result Value Ref Range   Sodium 133 (L) 135 - 145 mmol/L   Potassium 4.4 3.5 - 5.1 mmol/L   Chloride 104 101 - 111 mmol/L   CO2 20 (L) 22 - 32 mmol/L   Glucose, Bld 90 65 - 99 mg/dL   BUN 15 6 - 20 mg/dL   Creatinine, Ser 0.97 0.44 - 1.00 mg/dL   Calcium 8.5 (L) 8.9 - 10.3 mg/dL   Total Protein 7.2 6.5 - 8.1 g/dL   Albumin 2.6 (L) 3.5 - 5.0 g/dL   AST 32 15 - 41 U/L   ALT 17 14 - 54 U/L   Alkaline Phosphatase 89 38 - 126 U/L   Total Bilirubin 0.8 0.3 - 1.2 mg/dL   GFR calc non Af Amer 54 (L) >60 mL/min   GFR calc Af Amer >60 >60 mL/min    Comment: (NOTE) The eGFR has been calculated using the CKD EPI equation. This calculation has not been validated in all clinical situations. eGFR's persistently <60 mL/min signify possible Chronic Kidney Disease.    Anion gap 9 5 - 15    Comment: Performed at [facility] [address] [place], [place] [address]  CBC     Status: Abnormal   Collection Time: 02/21/18  3:19 PM  Result Value Ref Range   WBC 4.7 4.0 - 10.5 K/uL   RBC 3.60 (L) 3.87 - 5.11 MIL/uL   Hemoglobin 8.0 (L) 12.0 - 15.0 g/dL   HCT 26.5 (L) 36.0 - 46.0 %   MCV 73.6 (L) 78.0 - 100.0 fL   MCH 22.2 (L) 26.0 - 34.0 pg   MCHC 30.2 30.0 - 36.0 g/dL   RDW 17.2 (H) 11.5 - 15.5 %   Platelets 298 150 - 400 K/uL    Comment: Performed at [facility], [place] [address] [place], [place] [address]  POC occult blood, ED Provider will collect     Status: None   Collection Time: 02/21/18  7:35 PM  Result Value Ref Range   Fecal  Occult Bld NEGATIVE NEGATIVE    Ct Abdomen Pelvis W Contrast  Result Date: 02/21/2018 CLINICAL DATA:  Acute onset of diarrhea. Right upper quadrant abdominal pain. EXAM: CT ABDOMEN AND PELVIS WITH CONTRAST TECHNIQUE: Multidetector CT imaging of the abdomen and pelvis was performed using the standard protocol following bolus administration of intravenous contrast. CONTRAST:  180m OMNIPAQUE IOHEXOL 300 MG/ML  SOLN COMPARISON:  CT of the abdomen and pelvis performed 01/18/2017, and abdominal ultrasound performed 01/17/2018 FINDINGS: Lower chest: Small bilateral pleural effusions are  noted, with mild bibasilar atelectasis. A calcified granuloma is noted at the left lung base. The heart is enlarged. Scattered coronary artery calcifications are seen. Hepatobiliary: The liver is grossly unremarkable in appearance. Stones are noted within the gallbladder. There is question of mild pericholecystic fluid. The common bile duct is normal in caliber. Pancreas: The pancreas is within normal limits. Spleen: The spleen is enlarged, measuring 15.2 cm in length. Adrenals/Urinary Tract: The adrenal glands are unremarkable in appearance. The kidneys are within normal limits. There is no evidence of hydronephrosis. No renal or ureteral stones are identified. No perinephric stranding is seen. Stomach/Bowel: The stomach is unremarkable in appearance. The small bowel is within normal limits. The appendix is not visualized; there is no evidence for appendicitis. Scattered diverticulosis is noted along the sigmoid colon, without evidence of diverticulitis. Vascular/Lymphatic: Diffuse calcification is seen along the abdominal aorta and its branches. The abdominal aorta is otherwise grossly unremarkable. The inferior vena cava is grossly unremarkable. No retroperitoneal lymphadenopathy is seen. No pelvic sidewall lymphadenopathy is identified. Reproductive: The bladder is moderately distended and contains a small amount of air, possibly  reflecting recent Foley catheter placement. The patient is status post hysterectomy. No suspicious adnexal masses are seen. Other: No additional soft tissue abnormalities are seen. Musculoskeletal: No acute osseous abnormalities are identified. The visualized musculature is unremarkable in appearance. IMPRESSION: 1. Cholelithiasis, with question of mild pericholecystic fluid. Right upper quadrant ultrasound could be considered to assess for cholecystitis. 2. Small bilateral pleural effusions, with mild bibasilar atelectasis. 3. Splenomegaly. 4. Scattered coronary artery calcifications seen.  Cardiomegaly. 5. Scattered diverticulosis along the sigmoid colon, without evidence of diverticulitis. Aortic Atherosclerosis (ICD10-I70.0). Electronically Signed   By: Garald Balding M.D.   On: 02/21/2018 21:18    Review of Systems  Constitutional: Positive for chills. Negative for fever.  Gastrointestinal: Positive for abdominal pain, diarrhea, nausea and vomiting.  All other systems reviewed and are negative.  Blood pressure 136/66, pulse 97, temperature 99 F (37.2 C), temperature source Oral, resp. rate (!) 24, SpO2 92 %. Physical Exam  Vitals reviewed. Constitutional: She is oriented to person, place, and time. She appears well-developed.  She looks pale and elderly. Able to communicate clearly  HENT:  Head: Normocephalic and atraumatic.  Eyes: Pupils are equal, round, and reactive to light. EOM are normal.  Neck: Normal range of motion. Neck supple.  Cardiovascular: Normal rate and normal pulses. An irregularly irregular rhythm present.  No murmur heard. Pulses:      Carotid pulses are 2+ on the right side, and 2+ on the left side. Has systolic click from aortic valve. She is in atrial fibrillation  Respiratory: Effort normal and breath sounds normal.  GI: Soft. Normal appearance. She exhibits distension. Bowel sounds are decreased. There is tenderness in the right upper quadrant. There is  positive Murphy's sign. There is no rigidity and no rebound.  Neurological: She is alert and oriented to person, place, and time.  Skin: Skin is warm and dry.  Psychiatric: She has a normal mood and affect. Her behavior is normal. Judgment and thought content normal.    Assessment/Plan: Cholelithiasis and subacute cholecystitis. Diarrhea and possible C.diff. Colitis.  This has not been confirmed. AVR with mechanical valve, anticoagulated with Coumadin.  INR 2.0, PT 34.(?)  Patient does not need further workup of her GB, needs either a percutaneous drain or her GB removed after starting on heparin.  She can be started on heparin at any time which will need to  be stopped 4 hours prior to surgery. She will also need Cardiac clearance prior to surgery. IV antibiotics to be started with Rocephin.  We will follow with you.  Judeth Horn 02/21/2018, 10:58 PM

## 2018-02-21 NOTE — H&P (Addendum)
History and Physical    [name] TKP:[id number] DOB: Mar 02, 1941 DOA: 02/21/2018  Referring MD/NP/PA:   PCP: [name], MD   Patient coming from:  The patient is coming from home.  At baseline, pt is independent for most of ADL.   Chief Complaint: Abdominal pain, nausea, vomiting, diarrhea   HPI: [name] is a 77 y.o. female with medical history significant of hypertension, hyperlipidemia, diabetes mellitus, GERD, atrial fibrillation, AVR with mechanical valve on Coumadin, PVD, carotid artery stenosis, dCHF, CAD, GI bleeding, anxiety, who presents with abdominal pain, nausea vomiting and diarrhea.  Patient states that she has been having abdominal pain due to gallbladder problem for 3 months.  She has intermittent nausea, vomiting and abdominal pain, which has worsened since last night.  Abdominal pain is located in the right upper quadrant, constant, moderate, sharp, nonradiating.  She has subjective fever and chills.  She also has nausea and vomiting, she vomited once today without blood in the vomitus. She underwent HIDA scan on Friday 1 week ago with Dr. Alessandra Bevels and was told that she had a "collapsed gallbladder that was nonfunctional ".  She is scheduled to see [facility] surgery tomorrow for discussion of possible cholecystectomy.  Pt states that she has been having watery diarrhea since last Friday.  She 2-4 times of watery diarrhea each day. She was given prescription of Flagyl for possible c diff colitis by her doctor on 5/14. She has had 4 doses of the Flagyl, but she she has to stop taking Flagyl since she developed rashes all over her body. Patient denies any chest pain, shortness breath, cough, symptoms of UTI or unilateral weakness.  ED Course: pt was found to have WBC 4.7, lipase of 47, negative FOBT, electrolytes renal function okay, tachycardia, tachypnea, oxygen saturation 88 to 93% on room air, temperature 99.  CT abdomen/pelvis showed  cholelithiasis with pericholecystic fluid.  Patient is admitted to telemetry bed as inpatient.  General surgeon, Dr. Hulen Skains was consulted.  Review of Systems:   General: has subjective fevers, chills, no body weight gain, has poor appetite, has fatigue HEENT: no blurry vision, hearing changes or sore throat Respiratory: no dyspnea, coughing, wheezing CV: no chest pain, no palpitations GI: has nausea, vomiting, abdominal pain, diarrhea, no constipation GU: no dysuria, burning on urination, increased urinary frequency, hematuria  Ext: has leg edema Neuro: no unilateral weakness, numbness, or tingling, no vision change or hearing loss Skin: has rash, no skin tear. MSK: No muscle spasm, no deformity, no limitation of range of movement in spin Heme: No easy bruising.  Travel history: No recent long distant travel.  Allergy:  Allergies  Allergen Reactions  . Flagyl [Metronidazole] Rash    ALL-OVER BODY RASH  . Coreg [Carvedilol] Other (See Comments)    Terrible cramping in the feet and had a lot of bowel movements, but not diarrhea  . Losartan Swelling    Patient doesn't recall site of swelling  . Spironolactone Nausea Only    Caused extreme nausea  . Verapamil Hives  . Zetia [Ezetimibe] Other (See Comments)    Reaction not recalled  . Zocor [Simvastatin - High Dose] Other (See Comments)    Reaction not recalled    Past Medical History:  Diagnosis Date  . Aortic atherosclerosis ([place]) 01/23/2018  . Atrial fibrillation ([place])   . Carotid artery disease ([place])   . Cholelithiasis 01/23/2018  . Chronic anticoagulation   . Coronary artery disease    status post coronary  artery bypass grafting times 07/10/2004  . Diabetes mellitus   . Hypercholesteremia   . Hypertension   . Mechanical heart valve present    H. aortic valve replacement at the time of bypass surgery October 2005  . Peripheral arterial disease (H[facility])    history of left common iliac artery PTA and stenting for a chronic  total occlusion 08/26/01    Past Surgical History:  Procedure Laterality Date  . AORTIC VALVE REPLACEMENT    . CARDIAC CATHETERIZATION  11/10/2004   40% right common illiac, 70% in stent restenosis of distal left common illiac,   . CARDIAC CATHETERIZATION  05/18/2004   LAD 50-70% midstenosis, RCA dominant w/50% stenosis, 50% Right common Illiac artery ostial stenosis, 90% in stent restenosis within midportion of left common illiac stent  . Carotid Duplex  03/12/2012   RSA-elev. velocities suggestive of a 50-69% diameter reduction, Right&Left Bulb/Prox ICA-mild-mod.fibrous plaqueelevating Velocities abnormal study.  . COLONOSCOPY WITH PROPOFOL N/A 01/22/2018   Procedure: COLONOSCOPY WITH PROPOFOL;  Surgeon: Wilford Corner, MD;  Location: [place];  Service: Endoscopy;  Laterality: N/A;  . ESOPHAGOGASTRODUODENOSCOPY (EGD) WITH PROPOFOL N/A 01/22/2018   Procedure: ESOPHAGOGASTRODUODENOSCOPY (EGD) WITH PROPOFOL;  Surgeon: Wilford Corner, MD;  Location: [place];  Service: Endoscopy;  Laterality: N/A;  . Lower Ext. Duplex  03/12/2012   Right Proximal CIA- vessel narrowing w/elevated velocities 0-49% diameter reduction. Right SFA-mild mixed density plaque throughout vessel.  [name] NM MYOCAR PERF WALL MOTION  05/19/2010   protocol: Persantine, post stress EF 65%, negative for ischemia, low risk scan  . TRANSTHORACIC ECHOCARDIOGRAM  08/29/2012   Moderately calcified annulus of mitral valve, moderate regurg. of both mitral valve and tricuspid valve.     Social History:  reports that she has quit smoking. Her smoking use included cigarettes. She has a 30.00 pack-year smoking history. She has never used smokeless tobacco. She reports that she does not drink alcohol or use drugs.  Family History:  Family History  Problem Relation Age of Onset  . Breast cancer Neg Hx      Prior to Admission medications   Medication Sig Start Date End Date Taking? Authorizing Provider  acetaminophen  (TYLENOL) 325 MG tablet Take 325-650 mg by mouth every 6 (six) hours as needed (for pain or headaches).   Yes [provider]  ALPRAZolam Duanne Moron) 0.5 MG tablet Take 0.25-0.5 mg by mouth See admin instructions. Take 0.25-0.5 mg two times a day as needed for anxiety and 0.25-0.5 mg at bedtime scheduled   Yes [provider]  aspirin EC 81 MG tablet Take 81 mg by mouth every Monday, Wednesday, and Friday.    Yes [provider]  atorvastatin (LIPITOR) 80 MG tablet Take 1 tablet (80 mg total) by mouth daily. Patient taking differently: Take 80 mg by mouth daily with supper.  09/25/17  Yes Lorretta Harp, MD  Cholecalciferol (VITAMIN D3) 1000 UNITS CAPS Take 1,000 Units by mouth daily.    Yes [provider]  furosemide (LASIX) 20 MG tablet Take 60 mg by mouth daily.    Yes [provider]  glipiZIDE (GLUCOTROL XL) 2.5 MG 24 hr tablet Take 2.5 mg by mouth daily.  04/04/17  Yes [provider]  hydrALAZINE (APRESOLINE) 100 MG tablet Take 1 tablet (100 mg total) by mouth 3 (three) times daily. 11/07/17  Yes Lorretta Harp, MD  hydrocortisone cream 1 % Apply topically 2 (two) times daily. 01/29/18  Yes [name], MD  irbesartan (AVAPRO) 300 MG  tablet Take 1 tablet (300 mg total) by mouth daily. 09/25/17  Yes Lorretta Harp, MD  magnesium oxide (MAG-OX) 400 (241.3 Mg) MG tablet Take 1 tablet (400 mg total) by mouth daily. 01/30/18  Yes [name], MD  metFORMIN (GLUCOPHAGE) 500 MG tablet Take 500 mg by mouth 2 (two) times daily with a meal.    Yes [provider]  metoprolol tartrate (LOPRESSOR) 50 MG tablet Take 50 mg by mouth 2 (two) times daily. 10/16/17  Yes [provider]  metroNIDAZOLE (FLAGYL) 500 MG tablet Take 500 mg by mouth 3 (three) times daily. 02/18/18  Yes [provider]  ondansetron (ZOFRAN ODT) 4 MG disintegrating tablet 4mg  ODT q6-8 hours prn nausea/vomit Patient taking differently: Take  4 mg by mouth every 8 (eight) hours as needed for nausea.  02/02/18  Yes Muthersbaugh, Jarrett Soho, PA-C  pantoprazole (PROTONIX) 40 MG tablet Take 40 mg by mouth daily.  12/05/14  Yes [provider]  Polyethyl Glyc-Propyl Glyc PF (SYSTANE PRESERVATIVE FREE) 0.4-0.3 % SOLN Place 1-2 drops into both eyes 3 (three) times daily as needed (for irritation).    Yes [provider]  potassium chloride SA (K-DUR,KLOR-CON) 20 MEQ tablet Take 1 tablet (20 mEq total) by mouth daily. 01/30/18  Yes [name], MD  traMADol (ULTRAM) 50 MG tablet Take 50 mg by mouth every 6 (six) hours as needed (for headaches or pain).    Yes [provider]  vitamin B-12 (CYANOCOBALAMIN) 100 MCG tablet Take 100 mcg by mouth daily.    Yes [provider]  warfarin (COUMADIN) 5 MG tablet TAKE 1 TO 1.5 (ONE TO ONE & ONE-HALF) TABLETS BY MOUTH DAILY AS DIRECTED BY COUMADIN CLINIC Patient taking differently: Take 5 mg by mouth in the morning on Sun/Tues/Thurs/Sat and 7.5 mg on Mon/Wed/Fri 09/10/17  Yes Lorretta Harp, MD    Physical Exam: Vitals:   02/22/18 0000 02/22/18 0040 02/22/18 0320 02/22/18 0624  BP: 137/60 132/63 (!) 100/48 (!) 116/53  Pulse: (!) 108 98 94 84  Resp: (!) 30 20    Temp:  98.2 F (36.8 C) 97.6 F (36.4 C) 98.7 F (37.1 C)  TempSrc:  Oral Oral Oral  SpO2: 90% 93% 91% 91%  Weight:    63.2 kg (139 lb 5.3 oz)  Height:  5\' 1"  (1.549 m)     General: Not in acute distress HEENT:       Eyes: PERRL, EOMI, no scleral icterus.       ENT: No discharge from the ears and nose, no pharynx injection, no tonsillar enlargement.        Neck: No JVD, no bruit, no mass felt. Heme: No neck lymph node enlargement. Cardiac: S1/S2, RRR, No gallops or rubs. Respiratory: No rales, wheezing, rhonchi or rubs. GI: Soft, nondistended, has RUQ tenderness, no rebound pain, no organomegaly, BS present. GU: No hematuria Ext: 1+ pitting leg edema bilaterally. 2+DP/PT pulse  bilaterally. Musculoskeletal: No joint deformities, No joint redness or warmth, no limitation of ROM in spin. Skin: has rashes.  Neuro: Alert, oriented X3, cranial nerves II-XII grossly intact, moves all extremities normally.  Psych: Patient is not psychotic, no suicidal or hemocidal ideation.  Labs on Admission: I have personally reviewed following labs and imaging studies  CBC: Recent Labs  Lab 02/21/18 1519 02/22/18 0259  WBC 4.7 3.0*  HGB 8.0* 7.1*  HCT 26.5* 23.3*  MCV 73.6* 72.8*  PLT 298 353   Basic Metabolic Panel: Recent Labs  Lab 02/21/18 1519 02/22/18 0259  NA 133* 132*  K 4.4 4.0  CL 104 103  CO2 20* 20*  GLUCOSE 90 70  BUN 15 15  CREATININE 0.97 0.97  CALCIUM 8.5* 8.2*   GFR: Estimated Creatinine Clearance: 40.1 mL/min (by C-G formula based on SCr of 0.97 mg/dL). Liver Function Tests: Recent Labs  Lab 02/21/18 1519  AST 32  ALT 17  ALKPHOS 89  BILITOT 0.8  PROT 7.2  ALBUMIN 2.6*   Recent Labs  Lab 02/21/18 1519  LIPASE 47   No results for input(s): AMMONIA in the last 168 hours. Coagulation Profile: Recent Labs  Lab 02/21/18 2221  INR 4.54*   Cardiac Enzymes: No results for input(s): CKTOTAL, CKMB, CKMBINDEX, TROPONINI in the last 168 hours. BNP (last 3 results) No results for input(s): PROBNP in the last 8760 hours. HbA1C: No results for input(s): HGBA1C in the last 72 hours. CBG: Recent Labs  Lab 02/22/18 0102  GLUCAP 90   Lipid Profile: No results for input(s): CHOL, HDL, LDLCALC, TRIG, CHOLHDL, LDLDIRECT in the last 72 hours. Thyroid Function Tests: No results for input(s): TSH, T4TOTAL, FREET4, T3FREE, THYROIDAB in the last 72 hours. Anemia Panel: No results for input(s): VITAMINB12, FOLATE, FERRITIN, TIBC, IRON, RETICCTPCT in the last 72 hours. Urine analysis:    Component Value Date/Time   COLORURINE AMBER (A) 02/21/2018 2354   APPEARANCEUR HAZY (A) 02/21/2018 2354   LABSPEC 1.026 02/21/2018 2354   PHURINE 5.0  02/21/2018 2354   GLUCOSEU NEGATIVE 02/21/2018 2354   HGBUR MODERATE (A) 02/21/2018 2354   BILIRUBINUR NEGATIVE 02/21/2018 2354   KETONESUR NEGATIVE 02/21/2018 2354   PROTEINUR 100 (A) 02/21/2018 2354   UROBILINOGEN 1.0 10/06/2011 1709   NITRITE NEGATIVE 02/21/2018 2354   LEUKOCYTESUR MODERATE (A) 02/21/2018 2354   Sepsis Labs: @LABRCNTIP (procalcitonin:4,lacticidven:4) )No results found for this or any previous visit (from the past 240 hour(s)).   Radiological Exams on Admission: Ct Abdomen Pelvis W Contrast  Result Date: 02/21/2018 CLINICAL DATA:  Acute onset of diarrhea. Right upper quadrant abdominal pain. EXAM: CT ABDOMEN AND PELVIS WITH CONTRAST TECHNIQUE: Multidetector CT imaging of the abdomen and pelvis was performed using the standard protocol following bolus administration of intravenous contrast. CONTRAST:  157mL OMNIPAQUE IOHEXOL 300 MG/ML  SOLN COMPARISON:  CT of the abdomen and pelvis performed 01/18/2017, and abdominal ultrasound performed 01/17/2018 FINDINGS: Lower chest: Small bilateral pleural effusions are noted, with mild bibasilar atelectasis. A calcified granuloma is noted at the left lung base. The heart is enlarged. Scattered coronary artery calcifications are seen. Hepatobiliary: The liver is grossly unremarkable in appearance. Stones are noted within the gallbladder. There is question of mild pericholecystic fluid. The common bile duct is normal in caliber. Pancreas: The pancreas is within normal limits. Spleen: The spleen is enlarged, measuring 15.2 cm in length. Adrenals/Urinary Tract: The adrenal glands are unremarkable in appearance. The kidneys are within normal limits. There is no evidence of hydronephrosis. No renal or ureteral stones are identified. No perinephric stranding is seen. Stomach/Bowel: The stomach is unremarkable in appearance. The small bowel is within normal limits. The appendix is not visualized; there is no evidence for appendicitis. Scattered  diverticulosis is noted along the sigmoid colon, without evidence of diverticulitis. Vascular/Lymphatic: Diffuse calcification is seen along the abdominal aorta and its branches. The abdominal aorta is otherwise grossly unremarkable. The inferior vena cava is grossly unremarkable. No retroperitoneal lymphadenopathy is seen. No pelvic sidewall lymphadenopathy is identified. Reproductive: The bladder is moderately distended and contains a  small amount of air, possibly reflecting recent Foley catheter placement. The patient is status post hysterectomy. No suspicious adnexal masses are seen. Other: No additional soft tissue abnormalities are seen. Musculoskeletal: No acute osseous abnormalities are identified. The visualized musculature is unremarkable in appearance. IMPRESSION: 1. Cholelithiasis, with question of mild pericholecystic fluid. Right upper quadrant ultrasound could be considered to assess for cholecystitis. 2. Small bilateral pleural effusions, with mild bibasilar atelectasis. 3. Splenomegaly. 4. Scattered coronary artery calcifications seen.  Cardiomegaly. 5. Scattered diverticulosis along the sigmoid colon, without evidence of diverticulitis. Aortic Atherosclerosis (ICD10-I70.0). Electronically Signed   By: Garald Balding M.D.   On: 02/21/2018 21:18     EKG: Independently reviewed.  Atrial fibrillation, QTc 451, T wave inversion in lead III  Assessment/Plan Principal Problem:   Cholecystitis Active Problems:   Hypertension   Hypercholesteremia   Mechanical heart valve present   Chronic anticoagulation   Coronary artery disease   Diabetes mellitus without complication (HCC)   Atrial fibrillation (HCC)   Cholelithiasis   GERD (gastroesophageal reflux disease)   Anxiety   Chronic diastolic CHF (congestive heart failure) (HCC)   Diarrhea   Rash   UTI (urinary tract infection)   Cholecystitis and cholelithiasis: General surgeon, Dr. Dema Severin was consulted-->recommended to treat patient  with Rocephin now. Pt will need "either a percutaneous drain or her GB removed". Pt's INR is 4.54 now -will admit to tele bed as inpt -switch coumadin to IV heparin -will give 10 mg of Vk now -check INR daily.  -f/u blood culture -Morphine for pain, Zofran for nausea -IV fluid: 500 cc of normal saline -inpt card consult was requested via Epic for presurgical evaluation  Diarrhea: suspect c diff.  Patient could not tolerate Flagyl at home due to rash.   -check C. difficile PCR and GI pathogen panel  HTN:  -Continue home medications:irbesartan, metoprolol -IV hydralazine prn  Hypercholesteremia: -lipitor  Mechanical heart valve present: s/p of AVR. On coumadin, INR 4.54. -switched coumadin to IV heparin  Hx of Coronary artery disease: NO CP. -on ASA, lipitor and metoprolol  Diabetes mellitus without complication ([place]): Last A1c 6.4 on 10/06/2011, well controled. Patient is taking metformin and glipizide at home -SSI  Atrial Fibrillation: CHA2DS2-VASc Score is 7, needs oral anticoagulation. Patient is on Coumadin at home. Heart rate is controlled. -Metoprolol - On IV heparin  GERD: -Protonix  Anxiety: -Xanax  Chronic diastolic CHF (congestive heart failure) ([place]): 2D echo (31/18 showed EF 60-65%.  Patient has 1+ leg edema, BNP elevated at 698, but patient does not have respiratory distress.  She has fluid overload, but no acute CHF exacerbation. - Continue home dose of Lasix  UTI: UA positive -on rocephin -f/u Ux   Rash: due to Reaction to Flagyl - Discontinued Flagyl  DVT ppx: on IV Heparin     Code Status: Full code Family Communication:  Yes, patient's son and daughter at bed side Disposition Plan:  Anticipate discharge back to previous home environment Consults called: Education officer, environmental, Dr. Hulen Skains Admission status:  Inpatient/tele        Date of Service 02/22/2018    [facility] Pager [phone]  If 7PM-7AM, please contact  night-coverage [email or web address] Password [facility] 02/22/2018, 7:17 AM

## 2018-02-21 NOTE — ED Triage Notes (Signed)
Diarrhea states started ion new med on Tuesday and now she has broken out in rash diarrhea x 1 week

## 2018-02-21 NOTE — ED Notes (Signed)
Pt reports that she is supposed to see the surgeon tomorrow for her gallbladder.

## 2018-02-22 ENCOUNTER — Inpatient Hospital Stay (HOSPITAL_COMMUNITY): Payer: Medicare HMO

## 2018-02-22 ENCOUNTER — Encounter (HOSPITAL_COMMUNITY): Payer: Self-pay | Admitting: Cardiology

## 2018-02-22 ENCOUNTER — Other Ambulatory Visit: Payer: Self-pay

## 2018-02-22 DIAGNOSIS — I2510 Atherosclerotic heart disease of native coronary artery without angina pectoris: Secondary | ICD-10-CM

## 2018-02-22 DIAGNOSIS — N390 Urinary tract infection, site not specified: Secondary | ICD-10-CM | POA: Diagnosis present

## 2018-02-22 DIAGNOSIS — I482 Chronic atrial fibrillation: Secondary | ICD-10-CM

## 2018-02-22 DIAGNOSIS — Z01810 Encounter for preprocedural cardiovascular examination: Secondary | ICD-10-CM

## 2018-02-22 DIAGNOSIS — K819 Cholecystitis, unspecified: Secondary | ICD-10-CM

## 2018-02-22 LAB — URINALYSIS, ROUTINE W REFLEX MICROSCOPIC
Bilirubin Urine: NEGATIVE
Glucose, UA: NEGATIVE mg/dL
Ketones, ur: NEGATIVE mg/dL
Nitrite: NEGATIVE
Protein, ur: 100 mg/dL — AB
Specific Gravity, Urine: 1.026 (ref 1.005–1.030)
pH: 5 (ref 5.0–8.0)

## 2018-02-22 LAB — C DIFFICILE QUICK SCREEN W PCR REFLEX
C DIFFICILE (CDIFF) INTERP: NOT DETECTED
C DIFFICILE (CDIFF) TOXIN: NEGATIVE
C Diff antigen: NEGATIVE

## 2018-02-22 LAB — BASIC METABOLIC PANEL
Anion gap: 9 (ref 5–15)
BUN: 15 mg/dL (ref 6–20)
CALCIUM: 8.2 mg/dL — AB (ref 8.9–10.3)
CHLORIDE: 103 mmol/L (ref 101–111)
CO2: 20 mmol/L — ABNORMAL LOW (ref 22–32)
CREATININE: 0.97 mg/dL (ref 0.44–1.00)
GFR, EST NON AFRICAN AMERICAN: 54 mL/min — AB (ref >60)
Glucose, Bld: 70 mg/dL (ref 65–99)
Potassium: 4 mmol/L (ref 3.5–5.1)
SODIUM: 132 mmol/L — AB (ref 135–145)

## 2018-02-22 LAB — RETICULOCYTES
RBC.: 3.34 MIL/uL — AB (ref 3.87–5.11)
RETIC COUNT ABSOLUTE: 43.4 10*3/uL (ref 19.0–186.0)
RETIC CT PCT: 1.3 % (ref 0.4–3.1)

## 2018-02-22 LAB — CBC
HCT: 23.3 % — ABNORMAL LOW (ref 36.0–46.0)
Hemoglobin: 7.1 g/dL — ABNORMAL LOW (ref 12.0–15.0)
MCH: 22.2 pg — ABNORMAL LOW (ref 26.0–34.0)
MCHC: 30.5 g/dL (ref 30.0–36.0)
MCV: 72.8 fL — ABNORMAL LOW (ref 78.0–100.0)
PLATELETS: 250 10*3/uL (ref 150–400)
RBC: 3.2 MIL/uL — AB (ref 3.87–5.11)
RDW: 17 % — ABNORMAL HIGH (ref 11.5–15.5)
WBC: 3 10*3/uL — AB (ref 4.0–10.5)

## 2018-02-22 LAB — GLUCOSE, CAPILLARY
GLUCOSE-CAPILLARY: 90 mg/dL (ref 65–99)
GLUCOSE-CAPILLARY: 71 mg/dL (ref 65–99)
Glucose-Capillary: 67 mg/dL (ref 65–99)
Glucose-Capillary: 122 mg/dL — ABNORMAL HIGH (ref 65–99)
Glucose-Capillary: 86 mg/dL (ref 65–99)

## 2018-02-22 LAB — BRAIN NATRIURETIC PEPTIDE: B NATRIURETIC PEPTIDE 5: 690 pg/mL — AB (ref 0.0–100.0)

## 2018-02-22 LAB — VITAMIN B12: Vitamin B-12: 1223 pg/mL — ABNORMAL HIGH (ref 180–914)

## 2018-02-22 LAB — IRON AND TIBC
Iron: 9 ug/dL — ABNORMAL LOW (ref 28–170)
Saturation Ratios: 3 % — ABNORMAL LOW (ref 10.4–31.8)
TIBC: 309 ug/dL (ref 250–450)
UIBC: 300 ug/dL

## 2018-02-22 LAB — PROCALCITONIN: Procalcitonin: 0.19 ng/mL

## 2018-02-22 LAB — PROTIME-INR
INR: 4.54
INR: 4.65
Prothrombin Time: 42.7 seconds — ABNORMAL HIGH (ref 11.4–15.2)
Prothrombin Time: 43.5 seconds — ABNORMAL HIGH (ref 11.4–15.2)

## 2018-02-22 LAB — FERRITIN: FERRITIN: 30 ng/mL (ref 11–307)

## 2018-02-22 LAB — APTT: APTT: 82 s — AB (ref 24–36)

## 2018-02-22 LAB — LACTIC ACID, PLASMA
LACTIC ACID, VENOUS: 1 mmol/L (ref 0.5–1.9)
Lactic Acid, Venous: 1 mmol/L (ref 0.5–1.9)

## 2018-02-22 LAB — PREPARE RBC (CROSSMATCH)

## 2018-02-22 LAB — FOLATE: Folate: 13.9 ng/mL (ref >5.9)

## 2018-02-22 MED ORDER — SODIUM CHLORIDE 0.9 % IV SOLN
Freq: Once | INTRAVENOUS | Status: AC
  Administered 2018-02-22: 12:00:00 via INTRAVENOUS

## 2018-02-22 MED ORDER — VITAMIN K1 10 MG/ML IJ SOLN
10.0000 mg | Freq: Once | INTRAVENOUS | Status: DC
  Filled 2018-02-22: qty 1

## 2018-02-22 MED ORDER — PHYTONADIONE 5 MG PO TABS: 5.0000 mg | ORAL_TABLET | Freq: Once | ORAL | Status: DC

## 2018-02-22 MED ORDER — ONDANSETRON HCL 4 MG PO TABS
4.0000 mg | ORAL_TABLET | Freq: Three times a day (TID) | ORAL | Status: DC | PRN
Start: 2018-02-22 — End: 2018-03-05
  Administered 2018-02-22: 4 mg via ORAL
  Filled 2018-02-22: qty 1

## 2018-02-22 NOTE — Consult Note (Addendum)
Cardiology Consultation:   Patient ID: [name]; [id number]; [birth year]   Admit date: 02/21/2018 Date of Consult: 02/22/2018  Primary Care Provider: [name], MD Primary Cardiologist: Quay Burow, MD  Primary Electrophysiologist:  NA   Patient Profile:   [name] is a 77 y.o. female with a hx of CAD, CABG in 07/2004 X2 and AVR with St. Jude valve -on coumadin and PVOD, permanent a fib,  HTN, HLD, and diabetes who is being seen today for the evaluation of surgical clearance for cholelithiasis and subacute cholecystitis and will need drain or GB removed once on heparin at the request of Dr. Hulen Lang Zingg .  History of Present Illness:   [name] has a hx of CAD, CABG in 07/2004 X2 and AVR with St. Jude valve on coumadin and PVOD, HTN, HLD, permanent a fib and diabetes.  Most recent 2 D echo 02/2017 with normal LV systolic function with pk gradient of 48, mean of 24 and a PA pk pressure of 43.  Last nuc 08/2013 with anteroapical ischemia considered high risk, but Dr. [name] noted in 2006 also ischemia on nuc and cardiac cath revealed patent grafts, false positive study.     Pt admitted yesterday with N/V for 3 months off and on but developed diarrhea, rash and abd pain.  For diarrhea she was given flagyl and then developed rash.   She has not had any chest pain or SOB.    EKG a fib rate of 98 non specific T wave abnormality.   Na 132, K+ 4.0 BNP 690,    hgb 8.0 and today 7.1  INR 4.54   Cardiac CT: IMPRESSION: 1. Cholelithiasis, with question of mild pericholecystic fluid. Right upper quadrant ultrasound could be considered to assess for cholecystitis. 2. Small bilateral pleural effusions, with mild bibasilar atelectasis. 3. Splenomegaly. 4. Scattered coronary artery calcifications seen.  Cardiomegaly. 5. Scattered diverticulosis along the sigmoid colon, without evidence of diverticulitis. Aortic Atherosclerosis (ICD10-I70.0).  CXR from 02/01/18 with stable  cardiomegaly with aortic atherosclerosis and sm Lt pl effusion unchanged.   Dr. Hulen Kaiyan Luczak has seen for surgery.  She denies chest pain or SOB, she does her own vacuuming at home and pulls her large trash cans to street without chest pain or SOB.  She does feel her HR go up when she does that.  She has been feeling well except for her Nausea and vomiting. She has lost 10-11 lbs.    Past Medical History:  Diagnosis Date  . Aortic atherosclerosis ([facility]) 01/23/2018  . Atrial fibrillation ([facility])   . Carotid artery disease ([facility])   . Cholelithiasis 01/23/2018  . Chronic anticoagulation   . Coronary artery disease    status post coronary artery bypass grafting times 07/10/2004  . Diabetes mellitus   . Hypercholesteremia   . Hypertension   . Mechanical heart valve present    H. aortic valve replacement at the time of bypass surgery October 2005  . Peripheral arterial disease ([facility])    history of left common iliac artery PTA and stenting for a chronic total occlusion 08/26/01    Past Surgical History:  Procedure Laterality Date  . AORTIC VALVE REPLACEMENT    . CARDIAC CATHETERIZATION  11/10/2004   40% right common illiac, 70% in stent restenosis of distal left common illiac,   . CARDIAC CATHETERIZATION  05/18/2004   LAD 50-70% midstenosis, RCA dominant w/50% stenosis, 50% Right common Illiac artery ostial stenosis, 90% in stent restenosis within midportion of left common illiac  stent  . Carotid Duplex  03/12/2012   RSA-elev. velocities suggestive of a 50-69% diameter reduction, Right&Left Bulb/Prox ICA-mild-mod.fibrous plaqueelevating Velocities abnormal study.  . COLONOSCOPY WITH PROPOFOL N/A 01/22/2018   Procedure: COLONOSCOPY WITH PROPOFOL;  Surgeon: Wilford Corner, MD;  Location: [facility];  Service: Endoscopy;  Laterality: N/A;  . ESOPHAGOGASTRODUODENOSCOPY (EGD) WITH PROPOFOL N/A 01/22/2018   Procedure: ESOPHAGOGASTRODUODENOSCOPY (EGD) WITH PROPOFOL;  Surgeon: Wilford Corner, MD;   Location: [facility];  Service: Endoscopy;  Laterality: N/A;  . Lower Ext. Duplex  03/12/2012   Right Proximal CIA- vessel narrowing w/elevated velocities 0-49% diameter reduction. Right SFA-mild mixed density plaque throughout vessel.  [name] NM MYOCAR PERF WALL MOTION  05/19/2010   protocol: Persantine, post stress EF 65%, negative for ischemia, low risk scan  . TRANSTHORACIC ECHOCARDIOGRAM  08/29/2012   Moderately calcified annulus of mitral valve, moderate regurg. of both mitral valve and tricuspid valve.      Home Medications:  Prior to Admission medications   Medication Sig Start Date End Date Taking? Authorizing Provider  acetaminophen (TYLENOL) 325 MG tablet Take 325-650 mg by mouth every 6 (six) hours as needed (for pain or headaches).   Yes [provider]  ALPRAZolam Duanne Moron) 0.5 MG tablet Take 0.25-0.5 mg by mouth See admin instructions. Take 0.25-0.5 mg two times a day as needed for anxiety and 0.25-0.5 mg at bedtime scheduled   Yes [provider]  aspirin EC 81 MG tablet Take 81 mg by mouth every Monday, Wednesday, and Friday.    Yes [provider]  atorvastatin (LIPITOR) 80 MG tablet Take 1 tablet (80 mg total) by mouth daily. Patient taking differently: Take 80 mg by mouth daily with supper.  09/25/17  Yes Lorretta Harp, MD  Cholecalciferol (VITAMIN D3) 1000 UNITS CAPS Take 1,000 Units by mouth daily.    Yes [provider]  furosemide (LASIX) 20 MG tablet Take 60 mg by mouth daily.    Yes [provider]  glipiZIDE (GLUCOTROL XL) 2.5 MG 24 hr tablet Take 2.5 mg by mouth daily.  04/04/17  Yes [provider]  hydrALAZINE (APRESOLINE) 100 MG tablet Take 1 tablet (100 mg total) by mouth 3 (three) times daily. 11/07/17  Yes Lorretta Harp, MD  hydrocortisone cream 1 % Apply topically 2 (two) times daily. 01/29/18  Yes [name], MD  irbesartan (AVAPRO) 300 MG tablet Take 1 tablet (300 mg total) by mouth daily.  09/25/17  Yes Lorretta Harp, MD  magnesium oxide (MAG-OX) 400 (241.3 Mg) MG tablet Take 1 tablet (400 mg total) by mouth daily. 01/30/18  Yes [name], MD  metFORMIN (GLUCOPHAGE) 500 MG tablet Take 500 mg by mouth 2 (two) times daily with a meal.    Yes [provider]  metoprolol tartrate (LOPRESSOR) 50 MG tablet Take 50 mg by mouth 2 (two) times daily. 10/16/17  Yes [provider]  metroNIDAZOLE (FLAGYL) 500 MG tablet Take 500 mg by mouth 3 (three) times daily. 02/18/18  Yes [provider]  ondansetron (ZOFRAN ODT) 4 MG disintegrating tablet 4mg  ODT q6-8 hours prn nausea/vomit Patient taking differently: Take 4 mg by mouth every 8 (eight) hours as needed for nausea.  02/02/18  Yes Muthersbaugh, Jarrett Soho, PA-C  pantoprazole (PROTONIX) 40 MG tablet Take 40 mg by mouth daily.  12/05/14  Yes [provider]  Polyethyl Glyc-Propyl Glyc PF (SYSTANE PRESERVATIVE FREE) 0.4-0.3 % SOLN Place 1-2 drops into both eyes 3 (three) times daily as needed (for irritation).  Yes [provider]  potassium chloride SA (K-DUR,KLOR-CON) 20 MEQ tablet Take 1 tablet (20 mEq total) by mouth daily. 01/30/18  Yes [name], MD  traMADol (ULTRAM) 50 MG tablet Take 50 mg by mouth every 6 (six) hours as needed (for headaches or pain).    Yes [provider]  vitamin B-12 (CYANOCOBALAMIN) 100 MCG tablet Take 100 mcg by mouth daily.    Yes [provider]  warfarin (COUMADIN) 5 MG tablet TAKE 1 TO 1.5 (ONE TO ONE & ONE-HALF) TABLETS BY MOUTH DAILY AS DIRECTED BY COUMADIN CLINIC Patient taking differently: Take 5 mg by mouth in the morning on Sun/Tues/Thurs/Sat and 7.5 mg on Mon/Wed/Fri 09/10/17  Yes Lorretta Harp, MD    Inpatient Medications: Scheduled Meds: . ALPRAZolam  0.25-0.5 mg Oral QHS  . aspirin EC  81 mg Oral Q M,W,F  . atorvastatin  80 mg Oral Q supper  . cholecalciferol  1,000 Units Oral Daily  . furosemide  60 mg Oral Daily    . hydrALAZINE  100 mg Oral TID  . hydrocortisone cream   Topical BID  . insulin aspart  0-5 Units Subcutaneous QHS  . insulin aspart  0-9 Units Subcutaneous TID WC  . irbesartan  300 mg Oral Daily  . magnesium oxide  400 mg Oral Daily  . metoprolol tartrate  50 mg Oral BID  . pantoprazole  40 mg Oral Daily  . vitamin B-12  100 mcg Oral Daily   Continuous Infusions: . cefTRIAXone (ROCEPHIN)  IV    . phytonadione (VITAMIN K) IV     PRN Meds: acetaminophen, ALPRAZolam, hydrALAZINE, morphine injection, ondansetron (ZOFRAN) IV, polyvinyl alcohol, traMADol, zolpidem  Allergies:    Allergies  Allergen Reactions  . Flagyl [Metronidazole] Rash    ALL-OVER BODY RASH  . Coreg [Carvedilol] Other (See Comments)    Terrible cramping in the feet and had a lot of bowel movements, but not diarrhea  . Losartan Swelling    Patient doesn't recall site of swelling  . Spironolactone Nausea Only    Caused extreme nausea  . Verapamil Hives  . Zetia [Ezetimibe] Other (See Comments)    Reaction not recalled  . Zocor [Simvastatin - High Dose] Other (See Comments)    Reaction not recalled    Social History:   Social History   Socioeconomic History  . Marital status: Widowed    Spouse name: Not on file  . Number of children: Not on file  . Years of education: Not on file  . Highest education level: Not on file  Occupational History  . Not on file  Social Needs  . Financial resource strain: Not on file  . Food insecurity:    Worry: Not on file    Inability: Not on file  . Transportation needs:    Medical: Not on file    Non-medical: Not on file  Tobacco Use  . Smoking status: Former Smoker    Packs/day: 1.00    Years: 30.00    Pack years: 30.00    Types: Cigarettes  . Smokeless tobacco: Never Used  Substance and Sexual Activity  . Alcohol use: No    Alcohol/week: 0.0 oz  . Drug use: No  . Sexual activity: Not on file  Lifestyle  . Physical activity:    Days per week: Not on  file    Minutes per session: Not on file  . Stress: Not on file  Relationships  . Social connections:  Talks on phone: Not on file    Gets together: Not on file    Attends religious service: Not on file    Active member of club or organization: Not on file    Attends meetings of clubs or organizations: Not on file    Relationship status: Not on file  . Intimate partner violence:    Fear of current or ex partner: Not on file    Emotionally abused: Not on file    Physically abused: Not on file    Forced sexual activity: Not on file  Other Topics Concern  . Not on file  Social History Narrative  . Not on file    Family History:    Family History  Problem Relation Age of Onset  . Breast cancer Neg Hx      ROS:  Please see the history of present illness.  General:no colds or fevers, no weight changes Skin:no rashes or ulcers HEENT:no blurred vision, no congestion CV:see HPI PUL:see HPI GI:+ diarrhea + vomiting and nausea, no constipation or melena, no indigestion GU:no hematuria, no dysuria MS:no joint pain, no claudication Neuro:no syncope, no lightheadedness Endo:+ diabetes, no thyroid disease  All other ROS reviewed and negative.     Physical Exam/Data:   Vitals:   02/22/18 0000 02/22/18 0040 02/22/18 0320 02/22/18 0624  BP: 137/60 132/63 (!) 100/48 (!) 116/53  Pulse: (!) 108 98 94 84  Resp: (!) 30 20    Temp:  98.2 F (36.8 C) 97.6 F (36.4 C) 98.7 F (37.1 C)  TempSrc:  Oral Oral Oral  SpO2: 90% 93% 91% 91%  Weight:    139 lb 5.3 oz (63.2 kg)  Height:  5\' 1"  (1.549 m)      Intake/Output Summary (Last 24 hours) at 02/22/2018 0942 Last data filed at 02/22/2018 0314 Gross per 24 hour  Intake 760 ml  Output -  Net 760 ml   Filed Weights   02/22/18 0624  Weight: 139 lb 5.3 oz (63.2 kg)   Body mass index is 26.33 kg/m.  General:  Well nourished, well developed, in no acute distress, she is pale HEENT: normal Lymph: no adenopathy Neck: no  JVD Endocrine:  No thryomegaly Vascular: +bil carotid bruits; pedal pulses 2+ bilaterally   Cardiac:  irreg irreg soft systolic murmur no gallup rub or click Lungs:  clear to auscultation bilaterally, no wheezing, rhonchi or rales  Abd: soft, nontender, no hepatomegaly  Ext: 1-2+ edema Musculoskeletal:  No deformities, BUE and BLE strength normal and equal Skin: warm and dry  Neuro:  Alert and oriented X 3, MAE, follows commands no focal abnormalities noted Psych:  Normal affect     Relevant CV Studies: ECHO 03/08/17 Study Conclusions  - Left ventricle: The cavity size was mildly dilated. Wall   thickness was normal. Systolic function was normal. The estimated   ejection fraction was in the range of 60% to 65%. - Aortic valve: AV prosthesis is difficult to see. Peak and mean   gradients through the valve are 48 and 23 mm Hg respectively.   Mean gradient in 2017 was 17 mm Hg and 19 mm in 2016. - Mitral valve: There was mild regurgitation. - Left atrium: The atrium was mildly dilated. - Right ventricle: The cavity size was mildly dilated. - Right atrium: The atrium was mildly dilated. - Pulmonary arteries: PA peak pressure: 43 mm Hg (S).  Laboratory Data:  Chemistry Recent Labs  Lab 02/21/18 1519 02/22/18 0259  NA 133*  132*  K 4.4 4.0  CL 104 103  CO2 20* 20*  GLUCOSE 90 70  BUN 15 15  CREATININE 0.97 0.97  CALCIUM 8.5* 8.2*  GFRNONAA 54* 54*  GFRAA >60 >60  ANIONGAP 9 9    Recent Labs  Lab 02/21/18 1519  PROT 7.2  ALBUMIN 2.6*  AST 32  ALT 17  ALKPHOS 89  BILITOT 0.8   Hematology Recent Labs  Lab 02/21/18 1519 02/22/18 0259  WBC 4.7 3.0*  RBC 3.60* 3.20*  HGB 8.0* 7.1*  HCT 26.5* 23.3*  MCV 73.6* 72.8*  MCH 22.2* 22.2*  MCHC 30.2 30.5  RDW 17.2* 17.0*  PLT 298 250   Cardiac EnzymesNo results for input(s): TROPONINI in the last 168 hours. No results for input(s): TROPIPOC in the last 168 hours.  BNP Recent Labs  Lab 02/21/18 2354  BNP  690.0*    DDimer No results for input(s): DDIMER in the last 168 hours.  Radiology/Studies:  Ct Abdomen Pelvis W Contrast  Result Date: 02/21/2018 CLINICAL DATA:  Acute onset of diarrhea. Right upper quadrant abdominal pain. EXAM: CT ABDOMEN AND PELVIS WITH CONTRAST TECHNIQUE: Multidetector CT imaging of the abdomen and pelvis was performed using the standard protocol following bolus administration of intravenous contrast. CONTRAST:  111mL OMNIPAQUE IOHEXOL 300 MG/ML  SOLN COMPARISON:  CT of the abdomen and pelvis performed 01/18/2017, and abdominal ultrasound performed 01/17/2018 FINDINGS: Lower chest: Small bilateral pleural effusions are noted, with mild bibasilar atelectasis. A calcified granuloma is noted at the left lung base. The heart is enlarged. Scattered coronary artery calcifications are seen. Hepatobiliary: The liver is grossly unremarkable in appearance. Stones are noted within the gallbladder. There is question of mild pericholecystic fluid. The common bile duct is normal in caliber. Pancreas: The pancreas is within normal limits. Spleen: The spleen is enlarged, measuring 15.2 cm in length. Adrenals/Urinary Tract: The adrenal glands are unremarkable in appearance. The kidneys are within normal limits. There is no evidence of hydronephrosis. No renal or ureteral stones are identified. No perinephric stranding is seen. Stomach/Bowel: The stomach is unremarkable in appearance. The small bowel is within normal limits. The appendix is not visualized; there is no evidence for appendicitis. Scattered diverticulosis is noted along the sigmoid colon, without evidence of diverticulitis. Vascular/Lymphatic: Diffuse calcification is seen along the abdominal aorta and its branches. The abdominal aorta is otherwise grossly unremarkable. The inferior vena cava is grossly unremarkable. No retroperitoneal lymphadenopathy is seen. No pelvic sidewall lymphadenopathy is identified. Reproductive: The bladder is  moderately distended and contains a small amount of air, possibly reflecting recent Foley catheter placement. The patient is status post hysterectomy. No suspicious adnexal masses are seen. Other: No additional soft tissue abnormalities are seen. Musculoskeletal: No acute osseous abnormalities are identified. The visualized musculature is unremarkable in appearance. IMPRESSION: 1. Cholelithiasis, with question of mild pericholecystic fluid. Right upper quadrant ultrasound could be considered to assess for cholecystitis. 2. Small bilateral pleural effusions, with mild bibasilar atelectasis. 3. Splenomegaly. 4. Scattered coronary artery calcifications seen.  Cardiomegaly. 5. Scattered diverticulosis along the sigmoid colon, without evidence of diverticulitis. Aortic Atherosclerosis (ICD10-I70.0). Electronically Signed   By: Garald Balding M.D.   On: 02/21/2018 21:18    Assessment and Plan:   1.   Pre-op exam  for possible cholecystectomy  On u/s of GB acute cholecystitis --Dr. Marlou Porch to see pt--- she is active and has no chest pain or SOB.  It has been sometime since she had a nuc study.  That was  false positive.  Will repeat echo as it is time for one to be done.  No need for ischemic workup, meets about 4 METS.      2.      AVR in 2005 has been stable on echos and no SOB anticoagulation on coumadin with INR 4.54 --hold coumadin and begin heparin when INR less than 2.5  Would not give Vit K.   3.      Permanent a fib rate controlled on coumadin to go to heparin when INR is <2.5  4.      CAD with hx of CABG - last nuc 2014 was high risk but similar to one done 2006 and cath was with patent grafts.  Now without pain.  5.      Carotid disease with 40-59% RICA and 25-42% stenosis LICA 04/622  No neurologic symptoms  6.       PAD with normal ABIs bil with moderate bil. Common iliac artery stenoses no claudication.   7.        HLD  On lipitor 80 mg daily followed by her PCP and intolerant to zetia and  zocor  8.       Anemia with HGB 7.1 this AM- may benefit from transfusion keeping Hgb > 8. hgb dropped a Gm since admit, stool is heme neg. Would eval for anemia  9.       Diarrhea c. Diff screen pending but formed stool today.     For questions or updates, please contact [facility] Please consult [email or web address] for contact info under Cardiology/STEMI.   Signed, Cecilie Kicks, NP  02/22/2018 9:42 AM  Personally seen and examined. Agree with above.  77 year old with mechanical aortic valve here with acute cholecystitis, status post CABG in 2005 with mechanical aortic valve permanent atrial fibrillation on Coumadin INR 4.7.  She is able to complete greater than 4 METS of activity at home without anginal symptoms.  GEN: Well nourished, well developed, in no acute distress  HEENT: normal  Neck: no JVD, carotid bruits, or masses Cardiac: RRR; sharp S2 click, no murmurs, rubs, or gallops,no edema  Respiratory:  clear to auscultation bilaterally, normal work of breathing GI: soft, nontender, nondistended, + BS MS: no deformity or atrophy  Skin: Petechial rash nonblanching lower extremities started Tuesday after medication Neuro:  Alert and Oriented x 3, Strength and sensation are intact Psych: euthymic mood, full affect  Labs: Hemoglobin currently 7 with low MCV-question iron deficiency anemia Echocardiogram pending  Assessment and plan:  Preoperative risk stratification, cardiac prior to cholecystectomy - She is moderate risk for surgery based upon her previous cardiac history.  However she does not have any unstable angina, her valvular issues with mechanical aortic valve are stable.  No signs of significant heart failure although she does have some mild lower extremity edema fairly chronically likely dependent at home.  Her atrial fibrillation is permanent and under good rate control predominantly less than 110 bpm.  Based upon this she may proceed with surgery.  Her INR currently is  elevated however in the 4 range.  If possible, allow this to drift down naturally without vitamin K so that reinitiation of Coumadin/gaining INR of 2-3 will not be an issue postoperatively.  If urgent surgery is necessary, utilize FFP.  Mechanical aortic valve -Stable.  Checking echocardiogram.  Chronic anti-coagulation -Holding Coumadin  Permanent atrial fibrillation -Continue with rate control.  CAD post CABG - Currently stable without angina  Candee Furbish, MD

## 2018-02-22 NOTE — Progress Notes (Signed)
I notified Dr. Tawanna Solo that INR came back at 4.65. He d/c'd the Vitamin K

## 2018-02-22 NOTE — Progress Notes (Signed)
ANTICOAGULATION CONSULT NOTE - Initial Consult  Pharmacy Consult for heparin Indication: atrial fibrillation, mechanical valve  Allergies  Allergen Reactions  . Flagyl [Metronidazole] Rash    ALL-OVER BODY RASH  . Coreg [Carvedilol] Other (See Comments)    Terrible cramping in the feet and had a lot of bowel movements, but not diarrhea  . Losartan Swelling    Patient doesn't recall site of swelling  . Spironolactone Nausea Only    Caused extreme nausea  . Verapamil Hives  . Zetia [Ezetimibe] Other (See Comments)    Reaction not recalled  . Zocor [Simvastatin - High Dose] Other (See Comments)    Reaction not recalled    Assessment: 77 yo lady on coumadin PTA to start heparin when INR <3.0.  INR today 4.54 Goal of Therapy:  Heparin level 0.3-0.7 units/ml Monitor platelets by anticoagulation protocol: Yes   Plan:  F/u protime in am.  Zeric Baranowski [place] 02/22/2018,1:01 AM

## 2018-02-22 NOTE — Progress Notes (Signed)
PROGRESS NOTE    [name]  MBW:[id number] DOB: [birth year] DOA: 02/21/2018 PCP: [name], MD   Brief Narrative: Patient is a 77 year old female with past medical history significant for hypertension, hyperlipidemia, diabetes mellitus, GERD, atrial fibrillation, atrial valve replacement with mechanical valve on Coumadin, peripheral vascular disease, carotid artery stenosis, diastolic CHF, CAD, GI bleed who presents to the emergency department with complaints of abdominal pain, nausea and vomiting.   She was reporting to have abdominal pain on and off for last 3 months with intermittent nausea, vomiting.  Patient was also reported to have diarrhea. CT abdomen/pelvis done here showed cholelithiasis with pericholecystatic fluid.  Ultrasound done here was positive of cholecystitis.  She has been started on antibiotics and fluids.  General surgery consulted for possible cholecystectomy.  Cardiology involved for clearance.  Assessment & Plan:   Principal Problem:   Cholecystitis Active Problems:   Hypertension   Hypercholesteremia   Mechanical heart valve present   Chronic anticoagulation   Coronary artery disease   Diabetes mellitus without complication (HCC)   Atrial fibrillation (HCC)   Cholelithiasis   GERD (gastroesophageal reflux disease)   Anxiety   Chronic diastolic CHF (congestive heart failure) (HCC)   Diarrhea   Rash   UTI (urinary tract infection)   Cholecystitis/cholelithiasis: General surgery following.  Plan for cholecystectomy versus cholecystostomy with drainage .Started on antibiotics.  Continue pain management, Zofran for nausea.  Cardiology cleared for surgery.  Mechanical Aortic valve: Status post aortic valve replacement.  Was on Coumadin at home.  INR supratherapeutic now ,will avoid vitamin K and allow the INR to go down spontaneously. If urgent surgery is contemplated , we can use FFP.  Diarrhea: C. difficile suspected.  Will check PCR for C.  difficile and GI pathogen panel.  Anemia: Hemoglobin the range of 7.  FOBT negative.  We will follow-up iron panel.  We will transfuse her with 2 units of PRBC today.  Hypertension: Currently blood pressure stable.  Continue irbesartan and metoprolol.  Hypercholesterolemia: Continue Lipitor  History of coronary artery disease: Denies any chest pain at present.  Continue aspirin, Lipitor and metoprolol.  Diabetes mellitus: Last hemoglobin A1c was 6.4 on 12/12.  Patient was on metformin and glipizide at home.  We will continue sliding scale insulin here.[name]  History of atrial fibrillation: CHA2DS2-VASc Score is 7, needs oral anticoagulation. Patient is on Coumadin at home. Heart rate is controlled.  On metoprolol.  GERD: Continue Protonix  Chronic diastolic CHF: Last 2D echo showed ejection fraction of 60 to 65%.  She has trace pedal edema.  BNP slightly elevated on presentation.  Does not complain of any shortness of breath.  Continue home dose of Lasix.  Will follow up the new echocardiogram that has been ordered here.  Possible UTI: Urinalysis was positive UTI.  We have sent urine culture.  Continue ceftriaxone.  Rash: Suspected to be from the reaction from Flagyl.  Discontinued Flagyl.   DVT prophylaxis: Supratherapeutic INR Code Status: Full code Family Communication: None present at the bedside Disposition Plan: Needs to be determined   Consultants: Cardiology, general surgery  Procedures: None  Antimicrobials: Ceftriaxone since 02/21/18  Subjective: Patient seen and examined the bedside this morning.  Remains comfortable.  She denies any abdominal pain at present.  She has mild right upper quadrant tenderness.  Objective: Vitals:   02/22/18 0040 02/22/18 0320 02/22/18 0624 02/22/18 1019  BP: 132/63 (!) 100/48 (!) 116/53 (!) 132/93  Pulse: 98 94 84 94  Resp:  20     Temp: 98.2 F (36.8 C) 97.6 F (36.4 C) 98.7 F (37.1 C)   TempSrc: Oral Oral Oral   SpO2: 93% 91% 91%  95%  Weight:   63.2 kg (139 lb 5.3 oz)   Height: 5\' 1"  (1.549 m)       Intake/Output Summary (Last 24 hours) at 02/22/2018 1129 Last data filed at 02/22/2018 9242 Gross per 24 hour  Intake 760 ml  Output -  Net 760 ml   Filed Weights   02/22/18 0624  Weight: 63.2 kg (139 lb 5.3 oz)    Examination:  General exam: Appears calm and comfortable ,Not in distress,average built HEENT:PERRL,Oral mucosa moist, Ear/Nose normal on gross exam Respiratory system: Bilateral equal air entry, normal vesicular breath sounds, no wheezes or crackles  Cardiovascular system: S1 & S2 heard, RRR. No JVD, murmurs, rubs, gallops or clicks. No pedal edema. Gastrointestinal system: Abdomen is nondistended, soft .  Right upper quadrant tenderness. no organomegaly or masses felt. Normal bowel sounds heard. Central nervous system: Alert and oriented. No focal neurological deficits. Extremities:Trace edema, no clubbing ,no cyanosis, distal peripheral pulses palpable. Skin: No rashes, lesions or ulcers,no icterus ,no pallor MSK: Normal muscle bulk,tone ,power Psychiatry: Judgement and insight appear normal. Mood & affect appropriate.     Data Reviewed: I have personally reviewed following labs and imaging studies  CBC: Recent Labs  Lab 02/21/18 1519 02/22/18 0259  WBC 4.7 3.0*  HGB 8.0* 7.1*  HCT 26.5* 23.3*  MCV 73.6* 72.8*  PLT 298 683   Basic Metabolic Panel: Recent Labs  Lab 02/21/18 1519 02/22/18 0259  NA 133* 132*  K 4.4 4.0  CL 104 103  CO2 20* 20*  GLUCOSE 90 70  BUN 15 15  CREATININE 0.97 0.97  CALCIUM 8.5* 8.2*   GFR: Estimated Creatinine Clearance: 40.1 mL/min (by C-G formula based on SCr of 0.97 mg/dL). Liver Function Tests: Recent Labs  Lab 02/21/18 1519  AST 32  ALT 17  ALKPHOS 89  BILITOT 0.8  PROT 7.2  ALBUMIN 2.6*   Recent Labs  Lab 02/21/18 1519  LIPASE 47   No results for input(s): AMMONIA in the last 168 hours. Coagulation Profile: Recent Labs  Lab  02/21/18 2221 02/22/18 0918  INR 4.54* 4.65*   Cardiac Enzymes: No results for input(s): CKTOTAL, CKMB, CKMBINDEX, TROPONINI in the last 168 hours. BNP (last 3 results) No results for input(s): PROBNP in the last 8760 hours. HbA1C: No results for input(s): HGBA1C in the last 72 hours. CBG: Recent Labs  Lab 02/22/18 0102  GLUCAP 90   Lipid Profile: No results for input(s): CHOL, HDL, LDLCALC, TRIG, CHOLHDL, LDLDIRECT in the last 72 hours. Thyroid Function Tests: No results for input(s): TSH, T4TOTAL, FREET4, T3FREE, THYROIDAB in the last 72 hours. Anemia Panel: No results for input(s): VITAMINB12, FOLATE, FERRITIN, TIBC, IRON, RETICCTPCT in the last 72 hours. Sepsis Labs: Recent Labs  Lab 02/21/18 2354 02/22/18 0259  PROCALCITON 0.19  --   LATICACIDVEN 1.0 1.0    No results found for this or any previous visit (from the past 240 hour(s)).       Radiology Studies: Ct Abdomen Pelvis W Contrast  Result Date: 02/21/2018 CLINICAL DATA:  Acute onset of diarrhea. Right upper quadrant abdominal pain. EXAM: CT ABDOMEN AND PELVIS WITH CONTRAST TECHNIQUE: Multidetector CT imaging of the abdomen and pelvis was performed using the standard protocol following bolus administration of intravenous contrast. CONTRAST:  136mL OMNIPAQUE IOHEXOL 300 MG/ML  SOLN  COMPARISON:  CT of the abdomen and pelvis performed 01/18/2017, and abdominal ultrasound performed 01/17/2018 FINDINGS: Lower chest: Small bilateral pleural effusions are noted, with mild bibasilar atelectasis. A calcified granuloma is noted at the left lung base. The heart is enlarged. Scattered coronary artery calcifications are seen. Hepatobiliary: The liver is grossly unremarkable in appearance. Stones are noted within the gallbladder. There is question of mild pericholecystic fluid. The common bile duct is normal in caliber. Pancreas: The pancreas is within normal limits. Spleen: The spleen is enlarged, measuring 15.2 cm in length.  Adrenals/Urinary Tract: The adrenal glands are unremarkable in appearance. The kidneys are within normal limits. There is no evidence of hydronephrosis. No renal or ureteral stones are identified. No perinephric stranding is seen. Stomach/Bowel: The stomach is unremarkable in appearance. The small bowel is within normal limits. The appendix is not visualized; there is no evidence for appendicitis. Scattered diverticulosis is noted along the sigmoid colon, without evidence of diverticulitis. Vascular/Lymphatic: Diffuse calcification is seen along the abdominal aorta and its branches. The abdominal aorta is otherwise grossly unremarkable. The inferior vena cava is grossly unremarkable. No retroperitoneal lymphadenopathy is seen. No pelvic sidewall lymphadenopathy is identified. Reproductive: The bladder is moderately distended and contains a small amount of air, possibly reflecting recent Foley catheter placement. The patient is status post hysterectomy. No suspicious adnexal masses are seen. Other: No additional soft tissue abnormalities are seen. Musculoskeletal: No acute osseous abnormalities are identified. The visualized musculature is unremarkable in appearance. IMPRESSION: 1. Cholelithiasis, with question of mild pericholecystic fluid. Right upper quadrant ultrasound could be considered to assess for cholecystitis. 2. Small bilateral pleural effusions, with mild bibasilar atelectasis. 3. Splenomegaly. 4. Scattered coronary artery calcifications seen.  Cardiomegaly. 5. Scattered diverticulosis along the sigmoid colon, without evidence of diverticulitis. Aortic Atherosclerosis (ICD10-I70.0). Electronically Signed   By: Garald Balding M.D.   On: 02/21/2018 21:18   US Abdomen Limited Ruq  Result Date: 02/22/2018 CLINICAL DATA:  Right upper quadrant pain with nausea and vomiting EXAM: ULTRASOUND ABDOMEN LIMITED RIGHT UPPER QUADRANT COMPARISON:  CT abdomen and pelvis Feb 21, 2018 FINDINGS: Gallbladder: Within the  gallbladder, there are echogenic foci which move and shadow consistent with cholelithiasis. Largest gallstone measures 2.1 cm in length. Gallbladder wall appears thickened and edematous. There is trace pericholecystic fluid. No sonographic Murphy sign noted by sonographer. Common bile duct: Diameter: 5 mm. No intrahepatic or extrahepatic biliary duct dilatation. Liver: No focal lesion identified. Within normal limits in parenchymal echogenicity. Portal vein is patent on color Doppler imaging with normal direction of blood flow towards the liver. There is a right pleural effusion. The right kidney is noted to be echogenic with normal renal cortical thickness. IMPRESSION: 1. Gallstones with gallbladder wall thickening and slight pericholecystic fluid. The appearance of the gallbladder is felt to be indicative of acute cholecystitis. 2. Echogenic right kidney, a finding concerning for medical renal disease. Appropriate laboratory correlation in this regard advised. 3.  Small right pleural effusion. Electronically Signed   By: Lowella Grip III M.D.   On: 02/22/2018 10:13        Scheduled Meds: . ALPRAZolam  0.25-0.5 mg Oral QHS  . aspirin EC  81 mg Oral Q M,W,F  . atorvastatin  80 mg Oral Q supper  . cholecalciferol  1,000 Units Oral Daily  . furosemide  60 mg Oral Daily  . hydrALAZINE  100 mg Oral TID  . hydrocortisone cream   Topical BID  . insulin aspart  0-5 Units Subcutaneous QHS  .  insulin aspart  0-9 Units Subcutaneous TID WC  . irbesartan  300 mg Oral Daily  . magnesium oxide  400 mg Oral Daily  . metoprolol tartrate  50 mg Oral BID  . pantoprazole  40 mg Oral Daily  . vitamin B-12  100 mcg Oral Daily   Continuous Infusions: . sodium chloride    . cefTRIAXone (ROCEPHIN)  IV       LOS: 1 day    Time spent: More than 50% of that time was spent in counseling and/or coordination of care.      Shelly Coss, MD [facility] Pager [phone]  If 7PM-7AM, please  contact night-coverage [email or web address] Password [facility] 02/22/2018, 11:29 AM

## 2018-02-22 NOTE — Progress Notes (Signed)
Patient ID: [name], female   DOB: 09/14/1941, 77 y.o.   MRN: [id number]       Subjective: Pt states that for the last 2-3 months she has been having nausea with minimal emesis.  Food does not look appetizing to her.  She has had intermittent watery diarrhea for that time period as well.  She states she feels full and her pain is sharp in her RUQ.  Objective: Vital signs in last 24 hours: Temp:  [97.6 F (36.4 C)-99 F (37.2 C)] 98.7 F (37.1 C) (05/17 0624) Pulse Rate:  [84-108] 84 (05/17 0624) Resp:  [16-34] 20 (05/17 0040) BP: (100-144)/(48-96) 116/53 (05/17 0624) SpO2:  [88 %-96 %] 91 % (05/17 0624) Weight:  [63.2 kg (139 lb 5.3 oz)] 63.2 kg (139 lb 5.3 oz) (05/17 0624) Last BM Date: 02/22/18  Intake/Output from previous day: 05/16 0701 - 05/17 0700 In: 760 [P.O.:60; IV Piggyback:700] Out: -  Intake/Output this shift: No intake/output data recorded.  PE: Heart: irregularly irregular Lungs: CTAB Abd: soft, tender in RUQ, appears bloated, +BS  Lab Results:  Recent Labs    02/21/18 1519 02/22/18 0259  WBC 4.7 3.0*  HGB 8.0* 7.1*  HCT 26.5* 23.3*  PLT 298 250   BMET Recent Labs    02/21/18 1519 02/22/18 0259  NA 133* 132*  K 4.4 4.0  CL 104 103  CO2 20* 20*  GLUCOSE 90 70  BUN 15 15  CREATININE 0.97 0.97  CALCIUM 8.5* 8.2*   PT/INR Recent Labs    02/21/18 2221  LABPROT 42.7*  INR 4.54*   CMP     Component Value Date/Time   NA 132 (L) 02/22/2018 0259   NA 137 12/06/2017 1436   K 4.0 02/22/2018 0259   CL 103 02/22/2018 0259   CO2 20 (L) 02/22/2018 0259   GLUCOSE 70 02/22/2018 0259   BUN 15 02/22/2018 0259   BUN 20 12/06/2017 1436   CREATININE 0.97 02/22/2018 0259   CALCIUM 8.2 (L) 02/22/2018 0259   PROT 7.2 02/21/2018 1519   ALBUMIN 2.6 (L) 02/21/2018 1519   AST 32 02/21/2018 1519   ALT 17 02/21/2018 1519   ALKPHOS 89 02/21/2018 1519   BILITOT 0.8 02/21/2018 1519   GFRNONAA 54 (L) 02/22/2018 0259   GFRAA >60 02/22/2018 0259    Lipase     Component Value Date/Time   LIPASE 47 02/21/2018 1519       Studies/Results: Ct Abdomen Pelvis W Contrast  Result Date: 02/21/2018 CLINICAL DATA:  Acute onset of diarrhea. Right upper quadrant abdominal pain. EXAM: CT ABDOMEN AND PELVIS WITH CONTRAST TECHNIQUE: Multidetector CT imaging of the abdomen and pelvis was performed using the standard protocol following bolus administration of intravenous contrast. CONTRAST:  127mL OMNIPAQUE IOHEXOL 300 MG/ML  SOLN COMPARISON:  CT of the abdomen and pelvis performed 01/18/2017, and abdominal ultrasound performed 01/17/2018 FINDINGS: Lower chest: Small bilateral pleural effusions are noted, with mild bibasilar atelectasis. A calcified granuloma is noted at the left lung base. The heart is enlarged. Scattered coronary artery calcifications are seen. Hepatobiliary: The liver is grossly unremarkable in appearance. Stones are noted within the gallbladder. There is question of mild pericholecystic fluid. The common bile duct is normal in caliber. Pancreas: The pancreas is within normal limits. Spleen: The spleen is enlarged, measuring 15.2 cm in length. Adrenals/Urinary Tract: The adrenal glands are unremarkable in appearance. The kidneys are within normal limits. There is no evidence of hydronephrosis. No renal or ureteral stones are identified.  No perinephric stranding is seen. Stomach/Bowel: The stomach is unremarkable in appearance. The small bowel is within normal limits. The appendix is not visualized; there is no evidence for appendicitis. Scattered diverticulosis is noted along the sigmoid colon, without evidence of diverticulitis. Vascular/Lymphatic: Diffuse calcification is seen along the abdominal aorta and its branches. The abdominal aorta is otherwise grossly unremarkable. The inferior vena cava is grossly unremarkable. No retroperitoneal lymphadenopathy is seen. No pelvic sidewall lymphadenopathy is identified. Reproductive: The bladder is  moderately distended and contains a small amount of air, possibly reflecting recent Foley catheter placement. The patient is status post hysterectomy. No suspicious adnexal masses are seen. Other: No additional soft tissue abnormalities are seen. Musculoskeletal: No acute osseous abnormalities are identified. The visualized musculature is unremarkable in appearance. IMPRESSION: 1. Cholelithiasis, with question of mild pericholecystic fluid. Right upper quadrant ultrasound could be considered to assess for cholecystitis. 2. Small bilateral pleural effusions, with mild bibasilar atelectasis. 3. Splenomegaly. 4. Scattered coronary artery calcifications seen.  Cardiomegaly. 5. Scattered diverticulosis along the sigmoid colon, without evidence of diverticulitis. Aortic Atherosclerosis (ICD10-I70.0). Electronically Signed   By: Garald Balding M.D.   On: 02/21/2018 21:18    Anti-infectives: Anti-infectives (From admission, onward)   Start     Dose/Rate Route Frequency Ordered Stop   02/22/18 2200  cefTRIAXone (ROCEPHIN) 2 g in sodium chloride 0.9 % 100 mL IVPB     2 g 200 mL/hr over 30 Minutes Intravenous Every 24 hours 02/21/18 2312     02/21/18 2330  cefTRIAXone (ROCEPHIN) 1 g in sodium chloride 0.9 % 100 mL IVPB     1 g 200 mL/hr over 30 Minutes Intravenous  Once 02/21/18 2317 02/22/18 0344   02/21/18 2215  cefTRIAXone (ROCEPHIN) 1 g in sodium chloride 0.9 % 100 mL IVPB     1 g 200 mL/hr over 30 Minutes Intravenous  Once 02/21/18 2210 02/21/18 2334       Assessment/Plan RUQ abdominal pain, cholelithiasis -patient has been having symptoms of nausea and diarrhea with a loss of appetite for 2-3 months.  She had a HIDA scan 1 week ago that was negative.  Her pain began again last night.  CT shows some mild wall thickening. -RUQ [place] pending. -c diff pending -if US shows evidence of cholecystitis, then the patient will need to have her INR reversed and started on a heparin bridge. -cardiology will need  to see her and evaluate her for surgical clearance.  If she is felt to be high risk, she may require a perc drain to be placed.  Afib MVR - INR 4.54 today CAD DM HTN PAD   FEN - clear liquids VTE - SCDs/coumadin on hold ID - Rocephin   LOS: 1 day    Henreitta Cea , [facility] 02/22/2018, 10:01 AM Pager: [phone]

## 2018-02-22 NOTE — ED Notes (Signed)
No addl blood draw,  Pt enroute to inpatient floor. 

## 2018-02-23 ENCOUNTER — Inpatient Hospital Stay (HOSPITAL_COMMUNITY): Payer: Medicare HMO

## 2018-02-23 LAB — CBC WITH DIFFERENTIAL/PLATELET
ABS IMMATURE GRANULOCYTES: 0 10*3/uL (ref 0.0–0.1)
Basophils Absolute: 0.1 10*3/uL (ref 0.0–0.1)
Basophils Relative: 1 %
Eosinophils Absolute: 0.2 10*3/uL (ref 0.0–0.7)
Eosinophils Relative: 4 %
HEMATOCRIT: 31.9 % — AB (ref 36.0–46.0)
HEMOGLOBIN: 9.8 g/dL — AB (ref 12.0–15.0)
IMMATURE GRANULOCYTES: 1 %
LYMPHS ABS: 0.6 10*3/uL — AB (ref 0.7–4.0)
LYMPHS PCT: 15 %
MCH: 22.6 pg — AB (ref 26.0–34.0)
MCHC: 30.7 g/dL (ref 30.0–36.0)
MCV: 73.7 fL — AB (ref 78.0–100.0)
MONO ABS: 0.2 10*3/uL (ref 0.1–1.0)
MONOS PCT: 4 %
NEUTROS ABS: 2.9 10*3/uL (ref 1.7–7.7)
NEUTROS PCT: 75 %
Platelets: 267 10*3/uL (ref 150–400)
RBC: 4.33 MIL/uL (ref 3.87–5.11)
RDW: 17 % — ABNORMAL HIGH (ref 11.5–15.5)
WBC: 3.9 10*3/uL — ABNORMAL LOW (ref 4.0–10.5)

## 2018-02-23 LAB — GASTROINTESTINAL PANEL BY PCR, STOOL (REPLACES STOOL CULTURE)
ASTROVIRUS: NOT DETECTED
Adenovirus F40/41: NOT DETECTED
Campylobacter species: NOT DETECTED
Cryptosporidium: NOT DETECTED
Cyclospora cayetanensis: NOT DETECTED
ENTAMOEBA HISTOLYTICA: NOT DETECTED
ENTEROAGGREGATIVE E COLI (EAEC): NOT DETECTED
ENTEROPATHOGENIC E COLI (EPEC): NOT DETECTED
ENTEROTOXIGENIC E COLI (ETEC): NOT DETECTED
GIARDIA LAMBLIA: NOT DETECTED
NOROVIRUS GI/GII: NOT DETECTED
Plesimonas shigelloides: NOT DETECTED
ROTAVIRUS A: NOT DETECTED
SHIGA LIKE TOXIN PRODUCING E COLI (STEC): NOT DETECTED
Salmonella species: NOT DETECTED
Sapovirus (I, II, IV, and V): NOT DETECTED
Shigella/Enteroinvasive E coli (EIEC): NOT DETECTED
VIBRIO CHOLERAE: NOT DETECTED
Vibrio species: NOT DETECTED
Yersinia enterocolitica: NOT DETECTED

## 2018-02-23 LAB — GLUCOSE, CAPILLARY
GLUCOSE-CAPILLARY: 124 mg/dL — AB (ref 65–99)
GLUCOSE-CAPILLARY: 120 mg/dL — AB (ref 65–99)
GLUCOSE-CAPILLARY: 124 mg/dL — AB (ref 65–99)
Glucose-Capillary: 110 mg/dL — ABNORMAL HIGH (ref 65–99)
Glucose-Capillary: 103 mg/dL — ABNORMAL HIGH (ref 65–99)

## 2018-02-23 LAB — TYPE AND SCREEN
ABO/RH(D): A POS
ANTIBODY SCREEN: NEGATIVE
UNIT DIVISION: 0
Unit division: 0

## 2018-02-23 LAB — BPAM RBC
BLOOD PRODUCT EXPIRATION DATE: 201906012359
Blood Product Expiration Date: 201906012359
ISSUE DATE / TIME: 201905171217
ISSUE DATE / TIME: 201905171820
Unit Type and Rh: 6200
Unit Type and Rh: 6200

## 2018-02-23 LAB — PROTIME-INR
INR: 4.56
PROTHROMBIN TIME: 42.9 s — AB (ref 11.4–15.2)

## 2018-02-23 MED ORDER — FERROUS SULFATE 325 (65 FE) MG PO TABS
325.0000 mg | ORAL_TABLET | Freq: Two times a day (BID) | ORAL | Status: DC
  Administered 2018-02-23 – 2018-03-05 (×19): 325 mg via ORAL
  Filled 2018-02-23 (×20): qty 1

## 2018-02-23 NOTE — Progress Notes (Signed)
PROGRESS NOTE    [name]  FMB:[id number] DOB: January 18, 1941 DOA: 02/21/2018 PCP: [name], MD   Brief Narrative: Patient is a 77 year old female with past medical history significant for hypertension, hyperlipidemia, diabetes mellitus, GERD, atrial fibrillation, atrial valve replacement with mechanical valve on Coumadin, peripheral vascular disease, carotid artery stenosis, diastolic CHF, CAD, GI bleed who presents to the emergency department with complaints of abdominal pain, nausea and vomiting.   She was reporting to have abdominal pain on and off for last 3 months with intermittent nausea, vomiting.  Patient was also reported to have diarrhea. CT abdomen/pelvis done here showed cholelithiasis with pericholecystatic fluid.  Ultrasound done here was positive of cholecystitis.  She has been started on antibiotics and fluids.  General surgery consulted for possible cholecystectomy.  Cardiology involved for clearance.  Assessment & Plan:   Principal Problem:   Cholecystitis Active Problems:   Hypertension   Hypercholesteremia   Mechanical heart valve present   Chronic anticoagulation   Coronary artery disease   Diabetes mellitus without complication (HCC)   Atrial fibrillation (HCC)   Cholelithiasis   GERD (gastroesophageal reflux disease)   Anxiety   Chronic diastolic CHF (congestive heart failure) (HCC)   Diarrhea   Rash   UTI (urinary tract infection)   Cholecystitis/cholelithiasis: General surgery following.  Plan for cholecystectomy versus cholecystostomy with drainage .Started on antibiotics.  Continue pain management, Zofran for nausea.  Cardiology cleared for surgery. Abdomen pain much better today.  She looks comfortable.  Mechanical Aortic valve: Status post aortic valve replacement.  Was on Coumadin at home.  INR supratherapeutic now ,will avoid vitamin K and allow the INR to go down spontaneously. If urgent surgery is contemplated , we can use  FFP.  Diarrhea: C. difficile suspected. Diarrhea has stopped. Negative  PCR for C. difficile and GI pathogen panel.  Anemia: Hemoglobin the range of 9 today.  FOBT negative.  Anemia  Panel shows low iron.  Will start on iron supplementation.  S/P  2 units of PRBC .  Hypertension: Currently blood pressure stable.  Continue irbesartan and metoprolol.  Hypercholesterolemia: Continue Lipitor  History of coronary artery disease: Denies any chest pain at present.  Continue aspirin, Lipitor and metoprolol.  Diabetes mellitus: Last hemoglobin A1c was 6.4 on 12/12.  Patient was on metformin and glipizide at home.  We will continue sliding scale insulin here.[name]  History of atrial fibrillation: CHA2DS2-VASc Score is 7. Patient is on Coumadin at home. Heart rate is controlled.  On metoprolol.  GERD: Continue Protonix  Chronic diastolic CHF: Last 2D echo showed ejection fraction of 60 to 65%.  She has trace pedal edema.  BNP slightly elevated on presentation.  Does not complain of any shortness of breath.  Continue home dose of Lasix.  Will follow up the new echocardiogram that has been ordered here.  Possible UTI: Urinalysis was positive UTI.  We have sent urine culture.  Continue ceftriaxone.  Rash: Suspected to be from the reaction from Flagyl.  Discontinued Flagyl.   DVT prophylaxis: Supratherapeutic INR Code Status: Full code Family Communication: None present at the bedside Disposition Plan: Needs to be determined   Consultants: Cardiology, general surgery  Procedures: None  Antimicrobials: Ceftriaxone since 02/21/18  Subjective: Patient seen and examined the bedside this morning.  Remains comfortable.  Abdomen pain is much better.  Objective: Vitals:   02/22/18 2130 02/23/18 0135 02/23/18 0142 02/23/18 0854  BP: 135/75 138/74  (!) 151/68  Pulse:  86  82  Resp: 18 (!) 24  20  Temp: 97.9 F (36.6 C) 97.8 F (36.6 C)  97.8 F (36.6 C)  TempSrc: Oral Oral  Oral  SpO2:  90% 95%  94%  Weight:      Height:        Intake/Output Summary (Last 24 hours) at 02/23/2018 1208 Last data filed at 02/22/2018 2127 Gross per 24 hour  Intake 1365.58 ml  Output -  Net 1365.58 ml   Filed Weights   02/22/18 0624  Weight: 63.2 kg (139 lb 5.3 oz)    Examination:  General exam: Appears calm and comfortable ,Not in distress,average built HEENT:PERRL,Oral mucosa moist, Ear/Nose normal on gross exam Respiratory system: Bilateral equal air entry, normal vesicular breath sounds, no wheezes or crackles  Cardiovascular system: S1 & S2 heard, RRR. No JVD, murmurs, rubs, gallops or clicks. Gastrointestinal system: Abdomen is nondistended, soft and nontender. No organomegaly or masses felt. Normal bowel sounds heard.Mild RUQ tendereness Central nervous system: Alert and oriented. No focal neurological deficits. Extremities: No edema, no clubbing ,no cyanosis, distal peripheral pulses palpable. Skin: No rashes, lesions or ulcers,no icterus ,no pallor      Data Reviewed: I have personally reviewed following labs and imaging studies  CBC: Recent Labs  Lab 02/21/18 1519 02/22/18 0259 02/23/18 0711  WBC 4.7 3.0* 3.9*  NEUTROABS  --   --  2.9  HGB 8.0* 7.1* 9.8*  HCT 26.5* 23.3* 31.9*  MCV 73.6* 72.8* 73.7*  PLT 298 250 865   Basic Metabolic Panel: Recent Labs  Lab 02/21/18 1519 02/22/18 0259  NA 133* 132*  K 4.4 4.0  CL 104 103  CO2 20* 20*  GLUCOSE 90 70  BUN 15 15  CREATININE 0.97 0.97  CALCIUM 8.5* 8.2*   GFR: Estimated Creatinine Clearance: 40.1 mL/min (by C-G formula based on SCr of 0.97 mg/dL). Liver Function Tests: Recent Labs  Lab 02/21/18 1519  AST 32  ALT 17  ALKPHOS 89  BILITOT 0.8  PROT 7.2  ALBUMIN 2.6*   Recent Labs  Lab 02/21/18 1519  LIPASE 47   No results for input(s): AMMONIA in the last 168 hours. Coagulation Profile: Recent Labs  Lab 02/21/18 2221 02/22/18 0918 02/23/18 0711  INR 4.54* 4.65* 4.56*   Cardiac Enzymes: No  results for input(s): CKTOTAL, CKMB, CKMBINDEX, TROPONINI in the last 168 hours. BNP (last 3 results) No results for input(s): PROBNP in the last 8760 hours. HbA1C: No results for input(s): HGBA1C in the last 72 hours. CBG: Recent Labs  Lab 02/22/18 1652 02/22/18 2108 02/23/18 0144 02/23/18 0849 02/23/18 1147  GLUCAP 71 86 110* 103* 124*   Lipid Profile: No results for input(s): CHOL, HDL, LDLCALC, TRIG, CHOLHDL, LDLDIRECT in the last 72 hours. Thyroid Function Tests: No results for input(s): TSH, T4TOTAL, FREET4, T3FREE, THYROIDAB in the last 72 hours. Anemia Panel: Recent Labs    02/22/18 1106  VITAMINB12 1,223*  FOLATE 13.9  FERRITIN 30  TIBC 309  IRON 9*  RETICCTPCT 1.3   Sepsis Labs: Recent Labs  Lab 02/21/18 2354 02/22/18 0259  PROCALCITON 0.19  --   LATICACIDVEN 1.0 1.0    Recent Results (from the past 240 hour(s))  Culture, blood (Routine X 2) w Reflex to ID Panel     Status: None (Preliminary result)   Collection Time: 02/22/18 12:00 AM  Result Value Ref Range Status   Specimen Description BLOOD SITE NOT SPECIFIED  Final   Special Requests   Final    BOTTLES DRAWN  AEROBIC AND ANAEROBIC Blood Culture adequate volume   Culture   Final    NO GROWTH 1 DAY Performed at [facility], [place] [address], [place], [place] [address]    Report Status PENDING  Incomplete  Culture, blood (Routine X 2) w Reflex to ID Panel     Status: None (Preliminary result)   Collection Time: 02/22/18  2:54 AM  Result Value Ref Range Status   Specimen Description BLOOD LEFT HAND  Final   Special Requests   Final    BOTTLES DRAWN AEROBIC ONLY Blood Culture adequate volume   Culture   Final    NO GROWTH 1 DAY Performed at [facility], [place] [address] [place], [place] [address]    Report Status PENDING  Incomplete  C difficile quick scan w PCR reflex     Status: None   Collection Time: 02/22/18 12:09 PM  Result Value Ref Range Status   C Diff antigen NEGATIVE  NEGATIVE Final   C Diff toxin NEGATIVE NEGATIVE Final   C Diff interpretation No C. difficile detected.  Final    Comment: Performed at [facility], [place] [address] [place], [place] [address]  Gastrointestinal Panel by PCR , Stool     Status: None   Collection Time: 02/22/18 12:09 PM  Result Value Ref Range Status   Campylobacter species NOT DETECTED NOT DETECTED Final   Plesimonas shigelloides NOT DETECTED NOT DETECTED Final   Salmonella species NOT DETECTED NOT DETECTED Final   Yersinia enterocolitica NOT DETECTED NOT DETECTED Final   Vibrio species NOT DETECTED NOT DETECTED Final   Vibrio cholerae NOT DETECTED NOT DETECTED Final   Enteroaggregative E coli (EAEC) NOT DETECTED NOT DETECTED Final   Enteropathogenic E coli (EPEC) NOT DETECTED NOT DETECTED Final   Enterotoxigenic E coli (ETEC) NOT DETECTED NOT DETECTED Final   Shiga like toxin producing E coli (STEC) NOT DETECTED NOT DETECTED Final   Shigella/Enteroinvasive E coli (EIEC) NOT DETECTED NOT DETECTED Final   Cryptosporidium NOT DETECTED NOT DETECTED Final   Cyclospora cayetanensis NOT DETECTED NOT DETECTED Final   Entamoeba histolytica NOT DETECTED NOT DETECTED Final   Giardia lamblia NOT DETECTED NOT DETECTED Final   Adenovirus F40/41 NOT DETECTED NOT DETECTED Final   Astrovirus NOT DETECTED NOT DETECTED Final   Norovirus GI/GII NOT DETECTED NOT DETECTED Final   Rotavirus A NOT DETECTED NOT DETECTED Final   Sapovirus (I, II, IV, and V) NOT DETECTED NOT DETECTED Final    Comment: Performed at [facility], [address], [place], [place] [address]         Radiology Studies: Ct Abdomen Pelvis W Contrast  Result Date: 02/21/2018 CLINICAL DATA:  Acute onset of diarrhea. Right upper quadrant abdominal pain. EXAM: CT ABDOMEN AND PELVIS WITH CONTRAST TECHNIQUE: Multidetector CT imaging of the abdomen and pelvis was performed using the standard protocol following bolus administration of intravenous contrast.  CONTRAST:  121mL OMNIPAQUE IOHEXOL 300 MG/ML  SOLN COMPARISON:  CT of the abdomen and pelvis performed 01/18/2017, and abdominal ultrasound performed 01/17/2018 FINDINGS: Lower chest: Small bilateral pleural effusions are noted, with mild bibasilar atelectasis. A calcified granuloma is noted at the left lung base. The heart is enlarged. Scattered coronary artery calcifications are seen. Hepatobiliary: The liver is grossly unremarkable in appearance. Stones are noted within the gallbladder. There is question of mild pericholecystic fluid. The common bile duct is normal in caliber. Pancreas: The pancreas is within normal limits. Spleen: The spleen  is enlarged, measuring 15.2 cm in length. Adrenals/Urinary Tract: The adrenal glands are unremarkable in appearance. The kidneys are within normal limits. There is no evidence of hydronephrosis. No renal or ureteral stones are identified. No perinephric stranding is seen. Stomach/Bowel: The stomach is unremarkable in appearance. The small bowel is within normal limits. The appendix is not visualized; there is no evidence for appendicitis. Scattered diverticulosis is noted along the sigmoid colon, without evidence of diverticulitis. Vascular/Lymphatic: Diffuse calcification is seen along the abdominal aorta and its branches. The abdominal aorta is otherwise grossly unremarkable. The inferior vena cava is grossly unremarkable. No retroperitoneal lymphadenopathy is seen. No pelvic sidewall lymphadenopathy is identified. Reproductive: The bladder is moderately distended and contains a small amount of air, possibly reflecting recent Foley catheter placement. The patient is status post hysterectomy. No suspicious adnexal masses are seen. Other: No additional soft tissue abnormalities are seen. Musculoskeletal: No acute osseous abnormalities are identified. The visualized musculature is unremarkable in appearance. IMPRESSION: 1. Cholelithiasis, with question of mild pericholecystic  fluid. Right upper quadrant ultrasound could be considered to assess for cholecystitis. 2. Small bilateral pleural effusions, with mild bibasilar atelectasis. 3. Splenomegaly. 4. Scattered coronary artery calcifications seen.  Cardiomegaly. 5. Scattered diverticulosis along the sigmoid colon, without evidence of diverticulitis. Aortic Atherosclerosis (ICD10-I70.0). Electronically Signed   By: Garald Balding M.D.   On: 02/21/2018 21:18   US Abdomen Limited Ruq  Result Date: 02/22/2018 CLINICAL DATA:  Right upper quadrant pain with nausea and vomiting EXAM: ULTRASOUND ABDOMEN LIMITED RIGHT UPPER QUADRANT COMPARISON:  CT abdomen and pelvis Feb 21, 2018 FINDINGS: Gallbladder: Within the gallbladder, there are echogenic foci which move and shadow consistent with cholelithiasis. Largest gallstone measures 2.1 cm in length. Gallbladder wall appears thickened and edematous. There is trace pericholecystic fluid. No sonographic Murphy sign noted by sonographer. Common bile duct: Diameter: 5 mm. No intrahepatic or extrahepatic biliary duct dilatation. Liver: No focal lesion identified. Within normal limits in parenchymal echogenicity. Portal vein is patent on color Doppler imaging with normal direction of blood flow towards the liver. There is a right pleural effusion. The right kidney is noted to be echogenic with normal renal cortical thickness. IMPRESSION: 1. Gallstones with gallbladder wall thickening and slight pericholecystic fluid. The appearance of the gallbladder is felt to be indicative of acute cholecystitis. 2. Echogenic right kidney, a finding concerning for medical renal disease. Appropriate laboratory correlation in this regard advised. 3.  Small right pleural effusion. Electronically Signed   By: Lowella Grip III M.D.   On: 02/22/2018 10:13        Scheduled Meds: . ALPRAZolam  0.25-0.5 mg Oral QHS  . aspirin EC  81 mg Oral Q M,W,F  . atorvastatin  80 mg Oral Q supper  . cholecalciferol   1,000 Units Oral Daily  . furosemide  60 mg Oral Daily  . hydrALAZINE  100 mg Oral TID  . hydrocortisone cream   Topical BID  . insulin aspart  0-5 Units Subcutaneous QHS  . insulin aspart  0-9 Units Subcutaneous TID WC  . irbesartan  300 mg Oral Daily  . magnesium oxide  400 mg Oral Daily  . metoprolol tartrate  50 mg Oral BID  . pantoprazole  40 mg Oral Daily  . vitamin B-12  100 mcg Oral Daily   Continuous Infusions: . cefTRIAXone (ROCEPHIN)  IV Stopped (02/22/18 2206)     LOS: 2 days    Time spent: 25 mins.More than 50% of that time was spent in  counseling and/or coordination of care.      Shelly Coss, MD [facility] Pager [phone]  If 7PM-7AM, please contact night-coverage [email or web address] Password [facility] 02/23/2018, 12:08 PM

## 2018-02-23 NOTE — Progress Notes (Signed)
[facility] Progress Note     Subjective: CC- RUQ/epigastric pain Patient states that she had a rough night. Continued to have RUQ/epigastric pain and nausea, but she is feeling a little better this morning. Tolerating clears. No emesis. INR down to 4.56 from 4.65. Seen by cardiology and cleared for surgery with moderate risk.  Objective: Vital signs in last 24 hours: Temp:  [97.2 F (36.2 C)-98.3 F (36.8 C)] 97.8 F (36.6 C) (05/18 0854) Pulse Rate:  [77-90] 82 (05/18 0854) Resp:  [16-24] 20 (05/18 0854) BP: (134-151)/(66-88) 151/68 (05/18 0854) SpO2:  [89 %-95 %] 94 % (05/18 0854) Last BM Date: 02/22/18  Intake/Output from previous day: 05/17 0701 - 05/18 0700 In: 1605.6 [P.O.:720; Blood:885.6] Out: -  Intake/Output this shift: No intake/output data recorded.  PE: Gen:  Alert, NAD, pleasant HEENT: EOM's intact, pupils equal and round Card:  Irregularly irregular, +murmur Pulm:  CTAB, no W/R/R, effort normal Abd: Soft, ND, +BS, no HSM, no hernia, mild TTP RUQ/epigastric region Ext:  Calves soft and nontender Psych: A&Ox3  Skin: no rashes noted, warm and dry  Lab Results:  Recent Labs    02/22/18 0259 02/23/18 0711  WBC 3.0* 3.9*  HGB 7.1* 9.8*  HCT 23.3* 31.9*  PLT 250 267   BMET Recent Labs    02/21/18 1519 02/22/18 0259  NA 133* 132*  K 4.4 4.0  CL 104 103  CO2 20* 20*  GLUCOSE 90 70  BUN 15 15  CREATININE 0.97 0.97  CALCIUM 8.5* 8.2*   PT/INR Recent Labs    02/22/18 0918 02/23/18 0711  LABPROT 43.5* 42.9*  INR 4.65* 4.56*   CMP     Component Value Date/Time   NA 132 (L) 02/22/2018 0259   NA 137 12/06/2017 1436   K 4.0 02/22/2018 0259   CL 103 02/22/2018 0259   CO2 20 (L) 02/22/2018 0259   GLUCOSE 70 02/22/2018 0259   BUN 15 02/22/2018 0259   BUN 20 12/06/2017 1436   CREATININE 0.97 02/22/2018 0259   CALCIUM 8.2 (L) 02/22/2018 0259   PROT 7.2 02/21/2018 1519   ALBUMIN 2.6 (L) 02/21/2018 1519   AST 32 02/21/2018  1519   ALT 17 02/21/2018 1519   ALKPHOS 89 02/21/2018 1519   BILITOT 0.8 02/21/2018 1519   GFRNONAA 54 (L) 02/22/2018 0259   GFRAA >60 02/22/2018 0259   Lipase     Component Value Date/Time   LIPASE 47 02/21/2018 1519       Studies/Results: Ct Abdomen Pelvis W Contrast  Result Date: 02/21/2018 CLINICAL DATA:  Acute onset of diarrhea. Right upper quadrant abdominal pain. EXAM: CT ABDOMEN AND PELVIS WITH CONTRAST TECHNIQUE: Multidetector CT imaging of the abdomen and pelvis was performed using the standard protocol following bolus administration of intravenous contrast. CONTRAST:  162mL OMNIPAQUE IOHEXOL 300 MG/ML  SOLN COMPARISON:  CT of the abdomen and pelvis performed 01/18/2017, and abdominal ultrasound performed 01/17/2018 FINDINGS: Lower chest: Small bilateral pleural effusions are noted, with mild bibasilar atelectasis. A calcified granuloma is noted at the left lung base. The heart is enlarged. Scattered coronary artery calcifications are seen. Hepatobiliary: The liver is grossly unremarkable in appearance. Stones are noted within the gallbladder. There is question of mild pericholecystic fluid. The common bile duct is normal in caliber. Pancreas: The pancreas is within normal limits. Spleen: The spleen is enlarged, measuring 15.2 cm in length. Adrenals/Urinary Tract: The adrenal glands are unremarkable in appearance. The kidneys are within normal limits. There is no evidence  of hydronephrosis. No renal or ureteral stones are identified. No perinephric stranding is seen. Stomach/Bowel: The stomach is unremarkable in appearance. The small bowel is within normal limits. The appendix is not visualized; there is no evidence for appendicitis. Scattered diverticulosis is noted along the sigmoid colon, without evidence of diverticulitis. Vascular/Lymphatic: Diffuse calcification is seen along the abdominal aorta and its branches. The abdominal aorta is otherwise grossly unremarkable. The inferior  vena cava is grossly unremarkable. No retroperitoneal lymphadenopathy is seen. No pelvic sidewall lymphadenopathy is identified. Reproductive: The bladder is moderately distended and contains a small amount of air, possibly reflecting recent Foley catheter placement. The patient is status post hysterectomy. No suspicious adnexal masses are seen. Other: No additional soft tissue abnormalities are seen. Musculoskeletal: No acute osseous abnormalities are identified. The visualized musculature is unremarkable in appearance. IMPRESSION: 1. Cholelithiasis, with question of mild pericholecystic fluid. Right upper quadrant ultrasound could be considered to assess for cholecystitis. 2. Small bilateral pleural effusions, with mild bibasilar atelectasis. 3. Splenomegaly. 4. Scattered coronary artery calcifications seen.  Cardiomegaly. 5. Scattered diverticulosis along the sigmoid colon, without evidence of diverticulitis. Aortic Atherosclerosis (ICD10-I70.0). Electronically Signed   By: Garald Balding M.D.   On: 02/21/2018 21:18   US Abdomen Limited Ruq  Result Date: 02/22/2018 CLINICAL DATA:  Right upper quadrant pain with nausea and vomiting EXAM: ULTRASOUND ABDOMEN LIMITED RIGHT UPPER QUADRANT COMPARISON:  CT abdomen and pelvis Feb 21, 2018 FINDINGS: Gallbladder: Within the gallbladder, there are echogenic foci which move and shadow consistent with cholelithiasis. Largest gallstone measures 2.1 cm in length. Gallbladder wall appears thickened and edematous. There is trace pericholecystic fluid. No sonographic Murphy sign noted by sonographer. Common bile duct: Diameter: 5 mm. No intrahepatic or extrahepatic biliary duct dilatation. Liver: No focal lesion identified. Within normal limits in parenchymal echogenicity. Portal vein is patent on color Doppler imaging with normal direction of blood flow towards the liver. There is a right pleural effusion. The right kidney is noted to be echogenic with normal renal cortical  thickness. IMPRESSION: 1. Gallstones with gallbladder wall thickening and slight pericholecystic fluid. The appearance of the gallbladder is felt to be indicative of acute cholecystitis. 2. Echogenic right kidney, a finding concerning for medical renal disease. Appropriate laboratory correlation in this regard advised. 3.  Small right pleural effusion. Electronically Signed   By: Lowella Grip III M.D.   On: 02/22/2018 10:13    Anti-infectives: Anti-infectives (From admission, onward)   Start     Dose/Rate Route Frequency Ordered Stop   02/22/18 2200  cefTRIAXone (ROCEPHIN) 2 g in sodium chloride 0.9 % 100 mL IVPB     2 g 200 mL/hr over 30 Minutes Intravenous Every 24 hours 02/21/18 2312     02/21/18 2330  cefTRIAXone (ROCEPHIN) 1 g in sodium chloride 0.9 % 100 mL IVPB     1 g 200 mL/hr over 30 Minutes Intravenous  Once 02/21/18 2317 02/22/18 0344   02/21/18 2215  cefTRIAXone (ROCEPHIN) 1 g in sodium chloride 0.9 % 100 mL IVPB     1 g 200 mL/hr over 30 Minutes Intravenous  Once 02/21/18 2210 02/21/18 2334       Assessment/Plan Afib AVR in 2005 - INR 4.56 from 4.65 today, cardiology prefers to allow INR to drift down naturally and begin heparin when INR<2.5 CAD DM HTN HLD PAD   RUQ abdominal pain, cholelithiasis Acute cholecystitis - u/s 5/17 showed gallstones with gallbladder wall thickening and slight pericholecystic fluid. The appearance of the gallbladder is  felt to be indicative of acute cholecystitis - LFTs WNL (5/16) - WBC 3.9, afebrile - cleared by cardiology for surgery with moderate risk  ID - rocephin 5/16>> FEN - IVF, CLD VTE - SCDs Foley - none Follow up - TBD  Plan - Plan for lap chole once INR normalizes. Continue clears and IV antibiotics for now.   LOS: 2 days    Wellington Hampshire , [facility] 02/23/2018, 12:54 PM Pager: [phone] Consults: [phone] Mon-Fri 7:00 am-4:30 pm Sat-Sun 7:00 am-11:30 am

## 2018-02-23 NOTE — Progress Notes (Signed)
ANTICOAGULATION CONSULT NOTE - Follow Up Consult  Pharmacy Consult for Heparin Indication: atrial fibrillation and mechanical valve  Allergies  Allergen Reactions  . Flagyl [Metronidazole] Rash    ALL-OVER BODY RASH  . Coreg [Carvedilol] Other (See Comments)    Terrible cramping in the feet and had a lot of bowel movements, but not diarrhea  . Losartan Swelling    Patient doesn't recall site of swelling  . Spironolactone Nausea Only    Caused extreme nausea  . Verapamil Hives  . Zetia [Ezetimibe] Other (See Comments)    Reaction not recalled  . Zocor [Simvastatin - High Dose] Other (See Comments)    Reaction not recalled    Patient Measurements: Height: 5\' 1"  (154.9 cm) Weight: 139 lb 5.3 oz (63.2 kg) IBW/kg (Calculated) : 47.8   Vital Signs: Temp: 97.8 F (36.6 C) (05/18 0854) Temp Source: Oral (05/18 0854) BP: 151/68 (05/18 0854) Pulse Rate: 82 (05/18 0854)  Labs: Recent Labs    02/21/18 1519 02/21/18 2221 02/21/18 2354 02/22/18 0259 02/22/18 0918 02/23/18 0711  HGB 8.0*  --   --  7.1*  --  9.8*  HCT 26.5*  --   --  23.3*  --  31.9*  PLT 298  --   --  250  --  267  APTT  --   --  82*  --   --   --   LABPROT  --  42.7*  --   --  43.5* 42.9*  INR  --  4.54*  --   --  4.65* 4.56*  CREATININE 0.97  --   --  0.97  --   --     Estimated Creatinine Clearance: 40.1 mL/min (by C-G formula based on SCr of 0.97 mg/dL).   Medications:  Last dose of Coumadin given 5/16  Assessment: 77 year old female admitted with cholecystitis who may need cholecystectomy. She is on chronic anticoagulation with Coumadin for a mechanical heart valve and atrial fibrillation. Her Coumadin is on hold for possible intervention, and her INR remains supratherapeutic today. She will begin heparin when her INR is <2.5.  Goal of Therapy:  Heparin level 0.3-0.7 units/ml Monitor platelets by anticoagulation protocol: Yes   Plan:  Daily PT/INR monitoring, start heparin when INR  <2.5  Legrand Como, Pharm.D., BCPS, BCIDP Clinical Pharmacist Phone: [phone] or [phone] 02/23/2018, 9:18 AM

## 2018-02-24 ENCOUNTER — Inpatient Hospital Stay (HOSPITAL_COMMUNITY): Payer: Medicare HMO

## 2018-02-24 DIAGNOSIS — I340 Nonrheumatic mitral (valve) insufficiency: Secondary | ICD-10-CM

## 2018-02-24 LAB — CBC WITH DIFFERENTIAL/PLATELET
ABS IMMATURE GRANULOCYTES: 0 10*3/uL (ref 0.0–0.1)
BASOS ABS: 0.1 10*3/uL (ref 0.0–0.1)
BASOS PCT: 1 %
EOS ABS: 0.2 10*3/uL (ref 0.0–0.7)
Eosinophils Relative: 4 %
HCT: 32.7 % — ABNORMAL LOW (ref 36.0–46.0)
Hemoglobin: 10 g/dL — ABNORMAL LOW (ref 12.0–15.0)
Immature Granulocytes: 1 %
Lymphocytes Relative: 13 %
Lymphs Abs: 0.5 10*3/uL — ABNORMAL LOW (ref 0.7–4.0)
MCH: 22.6 pg — AB (ref 26.0–34.0)
MCHC: 30.6 g/dL (ref 30.0–36.0)
MCV: 73.8 fL — AB (ref 78.0–100.0)
MONO ABS: 0.2 10*3/uL (ref 0.1–1.0)
MONOS PCT: 4 %
NEUTROS ABS: 3.1 10*3/uL (ref 1.7–7.7)
Neutrophils Relative %: 77 %
PLATELETS: 257 10*3/uL (ref 150–400)
RBC: 4.43 MIL/uL (ref 3.87–5.11)
RDW: 17.2 % — ABNORMAL HIGH (ref 11.5–15.5)
WBC: 4 10*3/uL (ref 4.0–10.5)

## 2018-02-24 LAB — BASIC METABOLIC PANEL
Anion gap: 9 (ref 5–15)
BUN: 15 mg/dL (ref 6–20)
CALCIUM: 8.3 mg/dL — AB (ref 8.9–10.3)
CO2: 22 mmol/L (ref 22–32)
Chloride: 99 mmol/L — ABNORMAL LOW (ref 101–111)
Creatinine, Ser: 1.04 mg/dL — ABNORMAL HIGH (ref 0.44–1.00)
GFR, EST AFRICAN AMERICAN: 58 mL/min — AB (ref >60)
GFR, EST NON AFRICAN AMERICAN: 50 mL/min — AB (ref >60)
GLUCOSE: 90 mg/dL (ref 65–99)
POTASSIUM: 4 mmol/L (ref 3.5–5.1)
Sodium: 130 mmol/L — ABNORMAL LOW (ref 135–145)

## 2018-02-24 LAB — URINALYSIS, COMPLETE (UACMP) WITH MICROSCOPIC
BILIRUBIN URINE: NEGATIVE
Glucose, UA: NEGATIVE mg/dL
Ketones, ur: NEGATIVE mg/dL
Leukocytes, UA: NEGATIVE
NITRITE: NEGATIVE
PROTEIN: NEGATIVE mg/dL
pH: 5.5 (ref 5.0–8.0)

## 2018-02-24 LAB — ECHOCARDIOGRAM COMPLETE
HEIGHTINCHES: 61 in
Weight: 2342.17 oz

## 2018-02-24 LAB — PROTIME-INR
INR: 5.5
INR: 3.79
PROTHROMBIN TIME: 49.6 s — AB (ref 11.4–15.2)
Prothrombin Time: 37.1 seconds — ABNORMAL HIGH (ref 11.4–15.2)

## 2018-02-24 LAB — GLUCOSE, CAPILLARY
GLUCOSE-CAPILLARY: 92 mg/dL (ref 65–99)
GLUCOSE-CAPILLARY: 111 mg/dL — AB (ref 65–99)
GLUCOSE-CAPILLARY: 96 mg/dL (ref 65–99)
GLUCOSE-CAPILLARY: 129 mg/dL — AB (ref 65–99)

## 2018-02-24 MED ORDER — ALUM & MAG HYDROXIDE-SIMETH 200-200-20 MG/5ML PO SUSP
30.0000 mL | ORAL | Status: DC | PRN
  Filled 2018-02-24: qty 30

## 2018-02-24 MED ORDER — SODIUM POLYSTYRENE SULFONATE 15 GM/60ML PO SUSP: 45.0000 g | Freq: Once | ORAL | Status: DC

## 2018-02-24 MED ORDER — FUROSEMIDE 40 MG PO TABS
40.0000 mg | ORAL_TABLET | Freq: Every day | ORAL | Status: DC
  Administered 2018-02-25 – 2018-02-26 (×2): 40 mg via ORAL
  Filled 2018-02-24 (×2): qty 1

## 2018-02-24 MED ORDER — DIPHENHYDRAMINE HCL 50 MG/ML IJ SOLN
INTRAMUSCULAR | Status: AC
  Administered 2018-02-24: 25 mg
  Filled 2018-02-24: qty 1

## 2018-02-24 MED ORDER — SODIUM CHLORIDE 0.9 % IV SOLN
Freq: Once | INTRAVENOUS | Status: AC
  Administered 2018-02-24: 11:00:00 via INTRAVENOUS

## 2018-02-24 MED ORDER — DIPHENHYDRAMINE HCL 50 MG/ML IJ SOLN
25.0000 mg | Freq: Four times a day (QID) | INTRAMUSCULAR | Status: DC | PRN
  Administered 2018-02-24: 25 mg via INTRAVENOUS

## 2018-02-24 NOTE — Progress Notes (Signed)
Paged [facility] for critical lab. INR 5.50

## 2018-02-24 NOTE — Progress Notes (Signed)
Subjective/Chief Complaint: Had worse RUQ pain last night now better   Objective: Vital signs in last 24 hours: Temp:  [97.8 F (36.6 C)-98.4 F (36.9 C)] 98.4 F (36.9 C) (05/19 0500) Pulse Rate:  [76-90] 76 (05/19 0500) Resp:  [14-25] 25 (05/19 0500) BP: (129-143)/(58-64) 129/60 (05/19 0500) SpO2:  [92 %-98 %] 96 % (05/19 0500) Weight:  [66.4 kg (146 lb 6.2 oz)] 66.4 kg (146 lb 6.2 oz) (05/19 0500) Last BM Date: 02/23/18  Intake/Output from previous day: 05/18 0701 - 05/19 0700 In: 100 [IV Piggyback:100] Out: 100 [Urine:100] Intake/Output this shift: No intake/output data recorded.  Exam: Abdomen soft, still with a lot of RUQ tenderness and guarding  Lab Results:  Recent Labs    02/23/18 0711 02/24/18 0450  WBC 3.9* 4.0  HGB 9.8* 10.0*  HCT 31.9* 32.7*  PLT 267 257   BMET Recent Labs    02/22/18 0259 02/24/18 0450  NA 132* 130*  K 4.0 4.0  CL 103 99*  CO2 20* 22  GLUCOSE 70 90  BUN 15 15  CREATININE 0.97 1.04*  CALCIUM 8.2* 8.3*   PT/INR Recent Labs    02/23/18 0711 02/24/18 0450  LABPROT 42.9* 49.6*  INR 4.56* 5.50*   ABG No results for input(s): PHART, HCO3 in the last 72 hours.  Invalid input(s): PCO2, PO2  Studies/Results: US Abdomen Limited Ruq  Result Date: 02/22/2018 CLINICAL DATA:  Right upper quadrant pain with nausea and vomiting EXAM: ULTRASOUND ABDOMEN LIMITED RIGHT UPPER QUADRANT COMPARISON:  CT abdomen and pelvis Feb 21, 2018 FINDINGS: Gallbladder: Within the gallbladder, there are echogenic foci which move and shadow consistent with cholelithiasis. Largest gallstone measures 2.1 cm in length. Gallbladder wall appears thickened and edematous. There is trace pericholecystic fluid. No sonographic Murphy sign noted by sonographer. Common bile duct: Diameter: 5 mm. No intrahepatic or extrahepatic biliary duct dilatation. Liver: No focal lesion identified. Within normal limits in parenchymal echogenicity. Portal vein is patent on  color Doppler imaging with normal direction of blood flow towards the liver. There is a right pleural effusion. The right kidney is noted to be echogenic with normal renal cortical thickness. IMPRESSION: 1. Gallstones with gallbladder wall thickening and slight pericholecystic fluid. The appearance of the gallbladder is felt to be indicative of acute cholecystitis. 2. Echogenic right kidney, a finding concerning for medical renal disease. Appropriate laboratory correlation in this regard advised. 3.  Small right pleural effusion. Electronically Signed   By: Lowella Grip III M.D.   On: 02/22/2018 10:13    Anti-infectives: Anti-infectives (From admission, onward)   Start     Dose/Rate Route Frequency Ordered Stop   02/22/18 2200  cefTRIAXone (ROCEPHIN) 2 g in sodium chloride 0.9 % 100 mL IVPB     2 g 200 mL/hr over 30 Minutes Intravenous Every 24 hours 02/21/18 2312     02/21/18 2330  cefTRIAXone (ROCEPHIN) 1 g in sodium chloride 0.9 % 100 mL IVPB     1 g 200 mL/hr over 30 Minutes Intravenous  Once 02/21/18 2317 02/22/18 0344   02/21/18 2215  cefTRIAXone (ROCEPHIN) 1 g in sodium chloride 0.9 % 100 mL IVPB     1 g 200 mL/hr over 30 Minutes Intravenous  Once 02/21/18 2210 02/21/18 2334      Assessment/Plan:   LOS: 3 days   Afib AVR in 2005 - INR 5.5 today, cardiology prefers to allow INR to drift down naturally and begin heparin when INR<2.5 CAD DM HTN HLD PAD  RUQ abdominal pain, cholelithiasis Acute cholecystitis  Eventual lap chole when INR down.  Control of INR per primary team   [name] A 02/24/2018

## 2018-02-24 NOTE — Progress Notes (Signed)
[name] from blood bank called me and said that the lab work and urine specimen resulted and did not indicate a transfusion reaction and the pathologist stated it was ok to administer 2nd unit of FFP. I informed the patient and her daughter at bedside and patient adamantly refuses the 2nd unit of FFP at this time. She stated that she still has a few hives on her and doesn't feel comfortable have barely re cooperating from the reaction. She felt very confident that she did have a reaction from the FFP and refused to have anymore transfusions until her INR is reassessed in the morning and then she would speak with the doctors to decide. I paged Dr. Tawanna Solo to be made aware

## 2018-02-24 NOTE — Progress Notes (Signed)
ANTICOAGULATION CONSULT NOTE - Follow Up Consult  Pharmacy Consult for Heparin Indication: atrial fibrillation and mechanical valve  Allergies  Allergen Reactions  . Flagyl [Metronidazole] Rash    ALL-OVER BODY RASH  . Coreg [Carvedilol] Other (See Comments)    Terrible cramping in the feet and had a lot of bowel movements, but not diarrhea  . Losartan Swelling    Patient doesn't recall site of swelling  . Spironolactone Nausea Only    Caused extreme nausea  . Verapamil Hives  . Zetia [Ezetimibe] Other (See Comments)    Reaction not recalled  . Zocor [Simvastatin - High Dose] Other (See Comments)    Reaction not recalled    Patient Measurements: Height: 5\' 1"  (154.9 cm) Weight: 146 lb 6.2 oz (66.4 kg) IBW/kg (Calculated) : 47.8   Vital Signs: Temp: 98.4 F (36.9 C) (05/19 0500) Temp Source: Oral (05/19 0500) BP: 129/60 (05/19 0500) Pulse Rate: 76 (05/19 0500)  Labs: Recent Labs    02/21/18 1519  02/21/18 2354 02/22/18 0259 02/22/18 0918 02/23/18 0711 02/24/18 0450  HGB 8.0*  --   --  7.1*  --  9.8* 10.0*  HCT 26.5*  --   --  23.3*  --  31.9* 32.7*  PLT 298  --   --  250  --  267 257  APTT  --   --  82*  --   --   --   --   LABPROT  --    < >  --   --  43.5* 42.9* 49.6*  INR  --    < >  --   --  4.65* 4.56* 5.50*  CREATININE 0.97  --   --  0.97  --   --  1.04*   < > = values in this interval not displayed.    Estimated Creatinine Clearance: 38.2 mL/min (A) (by C-G formula based on SCr of 1.04 mg/dL (H)).   Medications:  Last dose of Coumadin given 5/16  Assessment: 77 year old female admitted with cholecystitis who may need cholecystectomy. She is on chronic anticoagulation with Coumadin for a mechanical heart valve and atrial fibrillation. Her Coumadin is on hold for possible intervention, and her INR remains supratherapeutic today. She will begin heparin when her INR is <2.5. Note her last Coumadin dose was on 5/16.  Goal of Therapy:  Heparin level  0.3-0.7 units/ml Monitor platelets by anticoagulation protocol: Yes   Plan:  Daily PT/INR monitoring, start heparin when INR <2.5  Legrand Como, Pharm.D., BCPS, BCIDP Clinical Pharmacist Phone: [phone] or [phone] 02/24/2018, 8:53 AM

## 2018-02-24 NOTE — Progress Notes (Signed)
  Echocardiogram 2D Echocardiogram has been performed.  [name] 02/24/2018, 10:04 AM

## 2018-02-24 NOTE — Plan of Care (Signed)
  Problem: Education: Goal: Knowledge of General Education information will improve Outcome: Completed/Met

## 2018-02-24 NOTE — Progress Notes (Signed)
PROGRESS NOTE    [name]  DXA:[id number] DOB: 1941-05-05 DOA: 02/21/2018 PCP: [name], MD   Brief Narrative: Patient is a 77 year old female with past medical history significant for hypertension, hyperlipidemia, diabetes mellitus, GERD, atrial fibrillation, atrial valve replacement with mechanical valve on Coumadin, peripheral vascular disease, carotid artery stenosis, diastolic CHF, CAD, GI bleed who presents to the emergency department with complaints of abdominal pain, nausea and vomiting.   She was reporting to have abdominal pain on and off for last 3 months with intermittent nausea, vomiting.  Patient was also reported to have diarrhea. CT abdomen/pelvis done here showed cholelithiasis with pericholecystatic fluid.  Ultrasound done here was positive of cholecystitis.  She has been started on antibiotics.  General surgery consulted for possible cholecystectomy.  Cardiology involved for clearance.  Currently her abdominal pain has improved.  Plan is to do laparoscopic cholecystectomy after INR is optimal.  Assessment & Plan:   Principal Problem:   Cholecystitis Active Problems:   Hypertension   Hypercholesteremia   Mechanical heart valve present   Chronic anticoagulation   Coronary artery disease   Diabetes mellitus without complication (HCC)   Atrial fibrillation (HCC)   Cholelithiasis   GERD (gastroesophageal reflux disease)   Anxiety   Chronic diastolic CHF (congestive heart failure) (HCC)   Diarrhea   Rash   UTI (urinary tract infection)   Cholecystitis/cholelithiasis: General surgery following.  Plan for lap cholecystectomy  after her INR is optimal.Started on antibiotics.  Continue pain management, Zofran for nausea.  Cardiology cleared for surgery. Abdomen pain much better this morning.  She looks comfortable.  Mechanical Aortic valve: Status post aortic valve replacement.  Was on Coumadin at home.  INR was supratherapeutic ,plan is to avoid vitamin K  to avoid difficulty in bringing her INR back after surgery.Was waiting for INR to drift down naturally.  INR got worse today with 5.5 so she was ordered 2 units of FFP.  She developed allergy with hives after 1 unit of FFP so the second unit was not given.  INR check later today and was found to be 3.79. Lets hope INR will come down further tomorrow so she can go for lap cholecystectomy.Will start on heparin drip when INR falls below 2.5.  Diarrhea: C. difficile suspected. Diarrhea has stopped. Negative  PCR for C. difficile and GI pathogen panel.  Anemia: Hemoglobin the range of 10 today.  FOBT negative.  Anemia  Panel shows low iron.  Started on iron supplementation.  S/P  2 units of PRBC .  Hypertension: Currently blood pressure stable.  Continue irbesartan and metoprolol.  Hypercholesterolemia: Continue Lipitor  History of coronary artery disease: Denies any chest pain at present.  Continue aspirin, Lipitor and metoprolol.  Diabetes mellitus: Last hemoglobin A1c was 6.4 on 12/12.  Patient was on metformin and glipizide at home.  We will continue sliding scale insulin here.[name]  History of atrial fibrillation: CHA2DS2-VASc Score is 7. Patient is on Coumadin at home. Heart rate is controlled.  On metoprolol.  GERD: Continue Protonix  Chronic diastolic CHF: Last 2D echo showed ejection fraction of 60 to 65%.  She has trace pedal edema.  BNP slightly elevated on presentation.  Does not complain of any shortness of breath.  Takes Lasix 60 mg at home. New echocardiogram done here showed ejection fraction of 55 to 20%, grade 3 diastolic dysfunction, functioning mechanical aortic valve.  Hyponatremia: We will decrease the dose of Lasix from 60 to 40 mg.  We will  continue to monitor  Possible UTI: Urinalysis was positive UTI. Denies any dysuria. We have sent urine culture.  Continue ceftriaxone.  Rash: Suspected to be from the reaction from Flagyl.  Discontinued Flagyl.   DVT prophylaxis:  Supratherapeutic INR Code Status: Full code Family Communication: None present at the bedside Disposition Plan: Needs to be determined   Consultants: Cardiology, general surgery  Procedures: None  Antimicrobials: Ceftriaxone since 02/21/18  Subjective: Patient seen and examined the bedside this morning.  Was undergoing echocardiogram.  Looked comfortable.  This morning her abdominal pain is better.  Objective: Vitals:   02/24/18 0500 02/24/18 1058 02/24/18 1123 02/24/18 1325  BP: 129/60 118/63 (!) 135/54 (!) 160/64  Pulse: 76   81  Resp: (!) 25   20  Temp: 98.4 F (36.9 C) (!) 97.1 F (36.2 C) 98.1 F (36.7 C) 98 F (36.7 C)  TempSrc: Oral Oral Oral Oral  SpO2: 96%   95%  Weight: 66.4 kg (146 lb 6.2 oz)     Height:        Intake/Output Summary (Last 24 hours) at 02/24/2018 1445 Last data filed at 02/24/2018 1200 Gross per 24 hour  Intake 580 ml  Output 100 ml  Net 480 ml   Filed Weights   02/22/18 0624 02/24/18 0500  Weight: 63.2 kg (139 lb 5.3 oz) 66.4 kg (146 lb 6.2 oz)    Examination:  General exam: Appears calm and comfortable ,Not in distress,average built HEENT:PERRL,Oral mucosa moist, Ear/Nose normal on gross exam Respiratory system: Bilateral equal air entry, normal vesicular breath sounds, no wheezes or crackles  Cardiovascular system: S1 & S2 heard, RRR. No JVD, murmurs, rubs, gallops or clicks. Gastrointestinal system: Abdomen is nondistended, soft . No organomegaly or masses felt. Normal bowel sounds heard.mild right upper quadrant tenderness. Central nervous system: Alert and oriented. No focal neurological deficits. Extremities: No edema, no clubbing ,no cyanosis, distal peripheral pulses palpable. Skin: Purpuric rashes on the bilateral lower extremities, no lesions or ulcers,no icterus ,no pallor Psychiatry: Judgement and insight appear normal. Mood & affect appropriate.     Data Reviewed: I have personally reviewed following labs and imaging  studies  CBC: Recent Labs  Lab 02/21/18 1519 02/22/18 0259 02/23/18 0711 02/24/18 0450  WBC 4.7 3.0* 3.9* 4.0  NEUTROABS  --   --  2.9 3.1  HGB 8.0* 7.1* 9.8* 10.0*  HCT 26.5* 23.3* 31.9* 32.7*  MCV 73.6* 72.8* 73.7* 73.8*  PLT 298 250 267 660   Basic Metabolic Panel: Recent Labs  Lab 02/21/18 1519 02/22/18 0259 02/24/18 0450  NA 133* 132* 130*  K 4.4 4.0 4.0  CL 104 103 99*  CO2 20* 20* 22  GLUCOSE 90 70 90  BUN 15 15 15   CREATININE 0.97 0.97 1.04*  CALCIUM 8.5* 8.2* 8.3*   GFR: Estimated Creatinine Clearance: 38.2 mL/min (A) (by C-G formula based on SCr of 1.04 mg/dL (H)). Liver Function Tests: Recent Labs  Lab 02/21/18 1519  AST 32  ALT 17  ALKPHOS 89  BILITOT 0.8  PROT 7.2  ALBUMIN 2.6*   Recent Labs  Lab 02/21/18 1519  LIPASE 47   No results for input(s): AMMONIA in the last 168 hours. Coagulation Profile: Recent Labs  Lab 02/21/18 2221 02/22/18 0918 02/23/18 0711 02/24/18 0450 02/24/18 1401  INR 4.54* 4.65* 4.56* 5.50* 3.79   Cardiac Enzymes: No results for input(s): CKTOTAL, CKMB, CKMBINDEX, TROPONINI in the last 168 hours. BNP (last 3 results) No results for input(s): PROBNP in the last  8760 hours. HbA1C: No results for input(s): HGBA1C in the last 72 hours. CBG: Recent Labs  Lab 02/23/18 1147 02/23/18 1723 02/23/18 2141 02/24/18 0727 02/24/18 1117  GLUCAP 124* 120* 124* 92 111*   Lipid Profile: No results for input(s): CHOL, HDL, LDLCALC, TRIG, CHOLHDL, LDLDIRECT in the last 72 hours. Thyroid Function Tests: No results for input(s): TSH, T4TOTAL, FREET4, T3FREE, THYROIDAB in the last 72 hours. Anemia Panel: Recent Labs    02/22/18 1106  VITAMINB12 1,223*  FOLATE 13.9  FERRITIN 30  TIBC 309  IRON 9*  RETICCTPCT 1.3   Sepsis Labs: Recent Labs  Lab 02/21/18 2354 02/22/18 0259  PROCALCITON 0.19  --   LATICACIDVEN 1.0 1.0    Recent Results (from the past 240 hour(s))  Culture, blood (Routine X 2) w Reflex to ID  Panel     Status: None (Preliminary result)   Collection Time: 02/22/18 12:00 AM  Result Value Ref Range Status   Specimen Description BLOOD SITE NOT SPECIFIED  Final   Special Requests   Final    BOTTLES DRAWN AEROBIC AND ANAEROBIC Blood Culture adequate volume   Culture   Final    NO GROWTH 2 DAYS Performed at [facility], [place] [address] [place], [place] [address]    Report Status PENDING  Incomplete  Culture, blood (Routine X 2) w Reflex to ID Panel     Status: None (Preliminary result)   Collection Time: 02/22/18  2:54 AM  Result Value Ref Range Status   Specimen Description BLOOD LEFT HAND  Final   Special Requests   Final    BOTTLES DRAWN AEROBIC ONLY Blood Culture adequate volume   Culture   Final    NO GROWTH 2 DAYS Performed at [facility], [place] [address], [place], [place] [address]    Report Status PENDING  Incomplete  C difficile quick scan w PCR reflex     Status: None   Collection Time: 02/22/18 12:09 PM  Result Value Ref Range Status   C Diff antigen NEGATIVE NEGATIVE Final   C Diff toxin NEGATIVE NEGATIVE Final   C Diff interpretation No C. difficile detected.  Final    Comment: Performed at [facility], [place] [address], [place], [place] [address]  Gastrointestinal Panel by PCR , Stool     Status: None   Collection Time: 02/22/18 12:09 PM  Result Value Ref Range Status   Campylobacter species NOT DETECTED NOT DETECTED Final   Plesimonas shigelloides NOT DETECTED NOT DETECTED Final   Salmonella species NOT DETECTED NOT DETECTED Final   Yersinia enterocolitica NOT DETECTED NOT DETECTED Final   Vibrio species NOT DETECTED NOT DETECTED Final   Vibrio cholerae NOT DETECTED NOT DETECTED Final   Enteroaggregative E coli (EAEC) NOT DETECTED NOT DETECTED Final   Enteropathogenic E coli (EPEC) NOT DETECTED NOT DETECTED Final   Enterotoxigenic E coli (ETEC) NOT DETECTED NOT DETECTED Final   Shiga like toxin producing E coli (STEC) NOT DETECTED NOT  DETECTED Final   Shigella/Enteroinvasive E coli (EIEC) NOT DETECTED NOT DETECTED Final   Cryptosporidium NOT DETECTED NOT DETECTED Final   Cyclospora cayetanensis NOT DETECTED NOT DETECTED Final   Entamoeba histolytica NOT DETECTED NOT DETECTED Final   Giardia lamblia NOT DETECTED NOT DETECTED Final   Adenovirus F40/41 NOT DETECTED NOT DETECTED Final   Astrovirus NOT DETECTED NOT DETECTED Final   Norovirus GI/GII NOT DETECTED NOT DETECTED Final   Rotavirus A NOT DETECTED NOT DETECTED Final  Sapovirus (I, II, IV, and V) NOT DETECTED NOT DETECTED Final    Comment: Performed at [facility], [address] [place], [place] [address]         Radiology Studies: No results found.      Scheduled Meds: . ALPRAZolam  0.25-0.5 mg Oral QHS  . aspirin EC  81 mg Oral Q M,W,F  . atorvastatin  80 mg Oral Q supper  . cholecalciferol  1,000 Units Oral Daily  . ferrous sulfate  325 mg Oral BID WC  . [START ON 02/25/2018] furosemide  40 mg Oral Daily  . hydrALAZINE  100 mg Oral TID  . hydrocortisone cream   Topical BID  . insulin aspart  0-5 Units Subcutaneous QHS  . insulin aspart  0-9 Units Subcutaneous TID WC  . irbesartan  300 mg Oral Daily  . magnesium oxide  400 mg Oral Daily  . metoprolol tartrate  50 mg Oral BID  . pantoprazole  40 mg Oral Daily  . vitamin B-12  100 mcg Oral Daily   Continuous Infusions: . cefTRIAXone (ROCEPHIN)  IV Stopped (02/23/18 2135)     LOS: 3 days    Time spent: 25 mins.More than 50% of that time was spent in counseling and/or coordination of care.      Shelly Coss, MD [facility] Pager [phone]  If 7PM-7AM, please contact night-coverage [email or web address] Password [email or web address] 02/24/2018, 2:45 PM

## 2018-02-24 NOTE — Progress Notes (Signed)
Late entry: I came into the patients room as there IV was beeping due to completion. I checked vital signs as it was time to do so. All vital signs within normal limits. Patient wanted to get out of bed to brush her teeth, wash her feet and fix her hair. I assist with this at the sink. Once we got done, pt complained that he lip was "feeling funny all the sudden." I noticed it was starting to get red and then she stated her face is feeling like it was starting to itch. Within minutes while I was at the bedside, her face started to have several areas of hives. On her forehead, nose, lips, cheeks, left side of neck, chin, upper back and one hive on her chest. I immediately called the MD and rapid response. I know patient did not have these hives on her prior to the end of the infusion because once I finished the infusion, I changed her gown and helped with her oral care and her skin was unremarkable. Dr. Tawanna Solo notified, Order received for IV benedryl and I immediately gave it to her. She stated she felt like she had a reaction from the FFP because she was fine before. INR checked after 1st unit of FFP done. I called Dr. Tawanna Solo the results. I took the blood product to the blood bank, filled out appropriate paperwork and sent UA as requested. I spoke to Dr. Tawanna Solo in regards to administering 2nd unit and at this time, he stated that it would be reassessed in the am.

## 2018-02-25 DIAGNOSIS — R1013 Epigastric pain: Secondary | ICD-10-CM

## 2018-02-25 DIAGNOSIS — L270 Generalized skin eruption due to drugs and medicaments taken internally: Secondary | ICD-10-CM

## 2018-02-25 LAB — TRANSFUSION REACTION
DAT C3: NEGATIVE
Post RXN DAT IgG: NEGATIVE

## 2018-02-25 LAB — BASIC METABOLIC PANEL
ANION GAP: 11 (ref 5–15)
BUN: 13 mg/dL (ref 6–20)
CALCIUM: 8.3 mg/dL — AB (ref 8.9–10.3)
CO2: 21 mmol/L — ABNORMAL LOW (ref 22–32)
CREATININE: 1 mg/dL (ref 0.44–1.00)
Chloride: 98 mmol/L — ABNORMAL LOW (ref 101–111)
GFR calc Af Amer: >60 mL/min (ref >60)
GFR, EST NON AFRICAN AMERICAN: 52 mL/min — AB (ref >60)
GLUCOSE: 106 mg/dL — AB (ref 65–99)
Potassium: 4.1 mmol/L (ref 3.5–5.1)
Sodium: 130 mmol/L — ABNORMAL LOW (ref 135–145)

## 2018-02-25 LAB — GLUCOSE, CAPILLARY
GLUCOSE-CAPILLARY: 96 mg/dL (ref 65–99)
Glucose-Capillary: 100 mg/dL — ABNORMAL HIGH (ref 65–99)
Glucose-Capillary: 117 mg/dL — ABNORMAL HIGH (ref 65–99)
Glucose-Capillary: 149 mg/dL — ABNORMAL HIGH (ref 65–99)

## 2018-02-25 LAB — PROTIME-INR
INR: 3.55
PROTHROMBIN TIME: 35.2 s — AB (ref 11.4–15.2)

## 2018-02-25 MED ORDER — SODIUM CHLORIDE 0.9 % IV SOLN
3.0000 g | Freq: Three times a day (TID) | INTRAVENOUS | Status: DC
  Administered 2018-02-25 – 2018-03-01 (×12): 3 g via INTRAVENOUS
  Filled 2018-02-25 (×13): qty 3

## 2018-02-25 MED ORDER — SACCHAROMYCES BOULARDII 250 MG PO CAPS
250.0000 mg | ORAL_CAPSULE | Freq: Two times a day (BID) | ORAL | Status: DC
  Administered 2018-02-25 – 2018-03-05 (×16): 250 mg via ORAL
  Filled 2018-02-25 (×17): qty 1

## 2018-02-25 NOTE — Care Management Important Message (Signed)
Important Message  Patient Details  Name: [name] MRN: [id number] Date of Birth: June 25, 1941   Medicare Important Message Given:  Yes    Orbie Pyo 02/25/2018, 4:32 PM

## 2018-02-25 NOTE — Progress Notes (Signed)
ANTICOAGULATION CONSULT NOTE - Follow Up Consult  Pharmacy Consult for Heparin Indication: atrial fibrillation and mechanical valve  Allergies  Allergen Reactions  . Flagyl [Metronidazole] Rash    ALL-OVER BODY RASH  . Coreg [Carvedilol] Other (See Comments)    Terrible cramping in the feet and had a lot of bowel movements, but not diarrhea  . Losartan Swelling    Patient doesn't recall site of swelling  . Spironolactone Nausea Only    Caused extreme nausea  . Verapamil Hives  . Zetia [Ezetimibe] Other (See Comments)    Reaction not recalled  . Zocor [Simvastatin - High Dose] Other (See Comments)    Reaction not recalled    Patient Measurements: Height: 5\' 1"  (154.9 cm) Weight: 143 lb 11.2 oz (65.2 kg) IBW/kg (Calculated) : 47.8   Vital Signs: Temp: 98.6 F (37 C) (05/20 0456) Temp Source: Oral (05/20 0456) BP: 118/77 (05/20 0456) Pulse Rate: 80 (05/20 0456)  Labs: Recent Labs    02/23/18 0711 02/24/18 0450 02/24/18 1401 02/25/18 0514  HGB 9.8* 10.0*  --   --   HCT 31.9* 32.7*  --   --   PLT 267 257  --   --   LABPROT 42.9* 49.6* 37.1* 35.2*  INR 4.56* 5.50* 3.79 3.55  CREATININE  --  1.04*  --  1.00    Estimated Creatinine Clearance: 39.5 mL/min (by C-G formula based on SCr of 1 mg/dL).   Medications:  Last dose of Coumadin given 5/16  Assessment: 77 year old female admitted with cholecystitis who may need cholecystectomy. She is on chronic anticoagulation with Coumadin for a mechanical heart valve and atrial fibrillation. Her Coumadin is on hold for possible intervention, and her INR remains supratherapeutic today (3.55). She will begin heparin when her INR is <2.5. Note her last Coumadin dose was on 5/16.  Goal of Therapy:  Heparin level 0.3-0.7 units/ml Monitor platelets by anticoagulation protocol: Yes   Plan:  Daily PT/INR monitoring, start heparin when INR <2.5  Georga Bora, PharmD Clinical Pharmacist 02/25/2018 9:04 AM

## 2018-02-25 NOTE — Progress Notes (Signed)
[facility] Progress Note     Subjective: CC: not feeling well Patient sleeping this AM, when woken she states she is just tired and not feeling too well today. Having diarrhea, this has been an issue for her for the last 3 months. Occasional abdominal pain in the RUQ that radiates to the back, comes on without any warning. Denies pain currently, no nausea or vomiting currently.   Objective: Vital signs in last 24 hours: Temp:  [97.1 F (36.2 C)-99.5 F (37.5 C)] 98.6 F (37 C) (05/20 0456) Pulse Rate:  [68-84] 84 (05/20 0911) Resp:  [18-30] 29 (05/20 0456) BP: (118-160)/(54-77) 155/58 (05/20 0911) SpO2:  [92 %-95 %] 92 % (05/20 0456) Weight:  [65.2 kg (143 lb 11.2 oz)] 65.2 kg (143 lb 11.2 oz) (05/20 0456) Last BM Date: 02/24/18  Intake/Output from previous day: 05/19 0701 - 05/20 0700 In: 940 [P.O.:840; IV Piggyback:100] Out: 400 [Urine:400] Intake/Output this shift: Total I/O In: 360 [P.O.:360] Out: -   PE: Gen:  Alert, NAD, pleasant Card:  Irregularly irregular, pedal pulses 1+ BL, 2+ pitting edema in BL LE Pulm:  Normal effort, clear to auscultation bilaterally Abd: Soft, mildly TTP in RUQ, non-distended, bowel sounds present in all 4 quadrants, no HSM, incisions C/D/I Skin: warm and dry, no rashes  Psych: A&Ox3   Lab Results:  Recent Labs    02/23/18 0711 02/24/18 0450  WBC 3.9* 4.0  HGB 9.8* 10.0*  HCT 31.9* 32.7*  PLT 267 257   BMET Recent Labs    02/24/18 0450 02/25/18 0514  NA 130* 130*  K 4.0 4.1  CL 99* 98*  CO2 22 21*  GLUCOSE 90 106*  BUN 15 13  CREATININE 1.04* 1.00  CALCIUM 8.3* 8.3*   PT/INR Recent Labs    02/24/18 1401 02/25/18 0514  LABPROT 37.1* 35.2*  INR 3.79 3.55   CMP     Component Value Date/Time   NA 130 (L) 02/25/2018 0514   NA 137 12/06/2017 1436   K 4.1 02/25/2018 0514   CL 98 (L) 02/25/2018 0514   CO2 21 (L) 02/25/2018 0514   GLUCOSE 106 (H) 02/25/2018 0514   BUN 13 02/25/2018 0514   BUN 20  12/06/2017 1436   CREATININE 1.00 02/25/2018 0514   CALCIUM 8.3 (L) 02/25/2018 0514   PROT 7.2 02/21/2018 1519   ALBUMIN 2.6 (L) 02/21/2018 1519   AST 32 02/21/2018 1519   ALT 17 02/21/2018 1519   ALKPHOS 89 02/21/2018 1519   BILITOT 0.8 02/21/2018 1519   GFRNONAA 52 (L) 02/25/2018 0514   GFRAA >60 02/25/2018 0514   Lipase     Component Value Date/Time   LIPASE 47 02/21/2018 1519       Studies/Results: No results found.  Anti-infectives: Anti-infectives (From admission, onward)   Start     Dose/Rate Route Frequency Ordered Stop   02/22/18 2200  cefTRIAXone (ROCEPHIN) 2 g in sodium chloride 0.9 % 100 mL IVPB     2 g 200 mL/hr over 30 Minutes Intravenous Every 24 hours 02/21/18 2312     02/21/18 2330  cefTRIAXone (ROCEPHIN) 1 g in sodium chloride 0.9 % 100 mL IVPB     1 g 200 mL/hr over 30 Minutes Intravenous  Once 02/21/18 2317 02/22/18 0344   02/21/18 2215  cefTRIAXone (ROCEPHIN) 1 g in sodium chloride 0.9 % 100 mL IVPB     1 g 200 mL/hr over 30 Minutes Intravenous  Once 02/21/18 2210 02/21/18 2334  Assessment/Plan Afib AVR in 2005 - INR 3.55 today, cardiology prefers to allow INR to drift down naturally and begin heparin when INR<2.5 CAD DM HTN HLD PAD  RUQ abdominal pain, cholelithiasis Acute cholecystitis - u/s 5/17 showed gallstones with gallbladder wall thickening and slight pericholecystic fluid. The appearance of the gallbladder is felt to be indicative of acute cholecystitis - LFTs WNL (5/16) - WBC 4.0 5/19, afebrile - cleared by cardiology for surgery with moderate risk Diarrhea - C. Diff negative and GI panel negative on 5/17, will add probiotic  ID - rocephin 5/16>> FEN - IVF, CLD VTE - SCDs Foley - none Follow up - TBD  Plan - Plan for lap chole once INR normalizes. Continue clears and IV antibiotics for now.    LOS: 4 days    [name] , [facility] 02/25/2018, 10:17 AM Pager: [phone] Consults:  [phone] Mon-Fri 7:00 am-4:30 pm Sat-Sun 7:00 am-11:30 am

## 2018-02-25 NOTE — Progress Notes (Signed)
Pharmacy Antibiotic Note  [name] is a 77 y.o. female admitted on 02/21/2018 with intra-abdominal infection.  Pharmacy has been consulted for Unasyn dosing.  Plan: Unasyn 3G IV q8 hours Monitor clinical progression and LOT  Height: 5\' 1"  (154.9 cm) Weight: 143 lb 11.2 oz (65.2 kg) IBW/kg (Calculated) : 47.8  Temp (24hrs), Avg:98.7 F (37.1 C), Min:98 F (36.7 C), Max:99.5 F (37.5 C)  Recent Labs  Lab 02/21/18 1519 02/21/18 2354 02/22/18 0259 02/23/18 0711 02/24/18 0450 02/25/18 0514  WBC 4.7  --  3.0* 3.9* 4.0  --   CREATININE 0.97  --  0.97  --  1.04* 1.00  LATICACIDVEN  --  1.0 1.0  --   --   --     Estimated Creatinine Clearance: 39.5 mL/min (by C-G formula based on SCr of 1 mg/dL).    Allergies  Allergen Reactions  . Flagyl [Metronidazole] Rash    ALL-OVER BODY RASH  . Coreg [Carvedilol] Other (See Comments)    Terrible cramping in the feet and had a lot of bowel movements, but not diarrhea  . Losartan Swelling    Patient doesn't recall site of swelling  . Spironolactone Nausea Only    Caused extreme nausea  . Verapamil Hives  . Zetia [Ezetimibe] Other (See Comments)    Reaction not recalled  . Zocor [Simvastatin - High Dose] Other (See Comments)    Reaction not recalled    Thank you for allowing pharmacy to be a part of this patient's care.  [name] 02/25/2018 12:45 PM

## 2018-02-25 NOTE — Plan of Care (Signed)
  Problem: Elimination: Goal: Will not experience complications related to bowel motility Outcome: Progressing   Problem: Safety: Goal: Ability to remain free from injury will improve Outcome: Progressing   

## 2018-02-25 NOTE — Progress Notes (Signed)
PROGRESS NOTE    [name]  GXQ:[id number] DOB: [birth year] DOA: 02/21/2018 PCP: [name], MD   Brief Narrative: Patient is a 77 -year-old female with past medical history significant for hypertension, hyperlipidemia, diabetes mellitus, GERD, atrial fibrillation, atrial valve replacement with mechanical valve on Coumadin, peripheral vascular disease, carotid artery stenosis, diastolic CHF, CAD, GI bleed who presents to the emergency department with complaints of abdominal pain, nausea and vomiting.   She was reporting to have abdominal pain on and off for last 3 months with intermittent nausea, vomiting.  Patient was also reported to have diarrhea. CT abdomen/pelvis done here showed cholelithiasis with pericholecystatic fluid.  Ultrasound done here was positive  for cholecystitis cholecystitis.  She has been started on antibiotics.  General surgery consulted for possible cholecystectomy once INR normalizes.     Assessment & Plan:   Principal Problem:   Cholecystitis Active Problems:   Hypertension   Hypercholesteremia   Mechanical heart valve present   Chronic anticoagulation   Coronary artery disease   Diabetes mellitus without complication (HCC)   Atrial fibrillation (HCC)   Cholelithiasis   GERD (gastroesophageal reflux disease)   Anxiety   Chronic diastolic CHF (congestive heart failure) (HCC)   Diarrhea   Rash   UTI (urinary tract infection)   Cholecystitis/cholelithiasis: General surgery following.  Plan for lap cholecystectomy  after her INR is optimal. currently on ceftriaxone, switch to Unasyn for better anaerobic coverage.  Continue pain management, Zofran for nausea.  Cardiology cleared for surgery. Abdomen pain much better this morning.  She looks comfortable.  Mechanical Aortic valve: Status post aortic valve replacement.  Was on Coumadin at home.  INR was supratherapeutic ,plan is to avoid vitamin K to avoid difficulty in bringing her INR back after  surgery.Was waiting for INR to drift down naturally. INR drifting down slowly,   Lets hope INR will come down further tomorrow so she can go for lap cholecystectomy. Will start on heparin drip when INR falls below 2.5.  Diarrhea: C. difficile suspected. Diarrhea has stopped. Negative  PCR for C. difficile and GI pathogen panel.  Anemia: Hemoglobin the range of 10 today.  FOBT negative.  Anemia  Panel shows low iron.  Started on iron supplementation.  S/P  2 units of PRBC . Last CBC was around 10, recheck tomorrow  Hypertension: Currently blood pressure stable.  Continue irbesartan and metoprolol.  Hypercholesterolemia: Continue Lipitor  History of coronary artery disease: Denies any chest pain at present.  Continue aspirin, Lipitor and metoprolol.  Diabetes mellitus: Last hemoglobin A1c was 6.4 on 12/12.  Patient was on metformin and glipizide at home.  We will continue sliding scale insulin here.[name]  History of atrial fibrillation: CHA2DS2-VASc Score is 7. Patient is on Coumadin at home. Heart rate is controlled.  On metoprolol.  GERD: Continue Protonix  Chronic diastolic CHF: Last 2D echo showed ejection fraction of 60 to 65%.  She has trace pedal edema.  BNP slightly elevated on presentation.  Does not complain of any shortness of breath.  Takes Lasix 60 mg at home. New echocardiogram done here showed ejection fraction of 55 to 14%, grade 3 diastolic dysfunction, functioning mechanical aortic valve.  Hyponatremia: We will decrease the dose of Lasix from 60 to 40 mg.  We will continue to monitor  Possible UTI: Urinalysis was positive UTI. Denies any dysuria. Follow urine culture      DVT prophylaxis: Supratherapeutic INR Code Status: Full code Family Communication: None present at the bedside  Disposition Plan:  Awaiting for cholecystectomy   Consultants: Cardiology, general surgery  Procedures: None  Antimicrobials: Ceftriaxone since 02/21/18  Subjective: Patient seen and  examined the bedside this morning.  She generally does not feel well, upset her INR is still now low enough, tolerating clears   Objective: Vitals:   02/24/18 2124 02/24/18 2128 02/25/18 0456 02/25/18 0911  BP: (!) 145/57  118/77 (!) 155/58  Pulse: 78  80 84  Resp: 20  (!) 29   Temp:  99.5 F (37.5 C) 98.6 F (37 C)   TempSrc:  Oral Oral   SpO2: 93%  92%   Weight:   65.2 kg (143 lb 11.2 oz)   Height:        Intake/Output Summary (Last 24 hours) at 02/25/2018 1221 Last data filed at 02/25/2018 1100 Gross per 24 hour  Intake 820 ml  Output -  Net 820 ml   Filed Weights   02/22/18 0624 02/24/18 0500 02/25/18 0456  Weight: 63.2 kg (139 lb 5.3 oz) 66.4 kg (146 lb 6.2 oz) 65.2 kg (143 lb 11.2 oz)    Examination:  General exam: Appears calm and comfortable ,Not in distress,average built HEENT:PERRL,Oral mucosa moist, Ear/Nose normal on gross exam Respiratory system: Bilateral equal air entry, normal vesicular breath sounds, no wheezes or crackles  Cardiovascular system: S1 & S2 heard, RRR. No JVD, murmurs, rubs, gallops or clicks. Gastrointestinal system: Abdomen is nondistended, soft . No organomegaly or masses felt. Normal bowel sounds heard.mild right upper quadrant tenderness. Central nervous system: Alert and oriented. No focal neurological deficits. Extremities: No edema, no clubbing ,no cyanosis, distal peripheral pulses palpable. Skin: Purpuric rashes on the bilateral lower extremities, no lesions or ulcers,no icterus ,no pallor Psychiatry: Judgement and insight appear normal. Mood & affect appropriate.     Data Reviewed: I have personally reviewed following labs and imaging studies  CBC: Recent Labs  Lab 02/21/18 1519 02/22/18 0259 02/23/18 0711 02/24/18 0450  WBC 4.7 3.0* 3.9* 4.0  NEUTROABS  --   --  2.9 3.1  HGB 8.0* 7.1* 9.8* 10.0*  HCT 26.5* 23.3* 31.9* 32.7*  MCV 73.6* 72.8* 73.7* 73.8*  PLT 298 250 267 858   Basic Metabolic Panel: Recent Labs  Lab  02/21/18 1519 02/22/18 0259 02/24/18 0450 02/25/18 0514  NA 133* 132* 130* 130*  K 4.4 4.0 4.0 4.1  CL 104 103 99* 98*  CO2 20* 20* 22 21*  GLUCOSE 90 70 90 106*  BUN 15 15 15 13   CREATININE 0.97 0.97 1.04* 1.00  CALCIUM 8.5* 8.2* 8.3* 8.3*   GFR: Estimated Creatinine Clearance: 39.5 mL/min (by C-G formula based on SCr of 1 mg/dL). Liver Function Tests: Recent Labs  Lab 02/21/18 1519  AST 32  ALT 17  ALKPHOS 89  BILITOT 0.8  PROT 7.2  ALBUMIN 2.6*   Recent Labs  Lab 02/21/18 1519  LIPASE 47   No results for input(s): AMMONIA in the last 168 hours. Coagulation Profile: Recent Labs  Lab 02/22/18 0918 02/23/18 0711 02/24/18 0450 02/24/18 1401 02/25/18 0514  INR 4.65* 4.56* 5.50* 3.79 3.55   Cardiac Enzymes: No results for input(s): CKTOTAL, CKMB, CKMBINDEX, TROPONINI in the last 168 hours. BNP (last 3 results) No results for input(s): PROBNP in the last 8760 hours. HbA1C: No results for input(s): HGBA1C in the last 72 hours. CBG: Recent Labs  Lab 02/24/18 0727 02/24/18 1117 02/24/18 1634 02/24/18 2127 02/25/18 0737  GLUCAP 92 111* 96 129* 100*   Lipid Profile: No  results for input(s): CHOL, HDL, LDLCALC, TRIG, CHOLHDL, LDLDIRECT in the last 72 hours. Thyroid Function Tests: No results for input(s): TSH, T4TOTAL, FREET4, T3FREE, THYROIDAB in the last 72 hours. Anemia Panel: No results for input(s): VITAMINB12, FOLATE, FERRITIN, TIBC, IRON, RETICCTPCT in the last 72 hours. Sepsis Labs: Recent Labs  Lab 02/21/18 2354 02/22/18 0259  PROCALCITON 0.19  --   LATICACIDVEN 1.0 1.0    Recent Results (from the past 240 hour(s))  Culture, blood (Routine X 2) w Reflex to ID Panel     Status: None (Preliminary result)   Collection Time: 02/22/18 12:00 AM  Result Value Ref Range Status   Specimen Description BLOOD SITE NOT SPECIFIED  Final   Special Requests   Final    BOTTLES DRAWN AEROBIC AND ANAEROBIC Blood Culture adequate volume   Culture   Final     NO GROWTH 2 DAYS Performed at [facility], [place] [address] [place], [place] [address]    Report Status PENDING  Incomplete  Culture, blood (Routine X 2) w Reflex to ID Panel     Status: None (Preliminary result)   Collection Time: 02/22/18  2:54 AM  Result Value Ref Range Status   Specimen Description BLOOD LEFT HAND  Final   Special Requests   Final    BOTTLES DRAWN AEROBIC ONLY Blood Culture adequate volume   Culture   Final    NO GROWTH 2 DAYS Performed at [facility], [place] [address], [place], [place] [address]    Report Status PENDING  Incomplete  C difficile quick scan w PCR reflex     Status: None   Collection Time: 02/22/18 12:09 PM  Result Value Ref Range Status   C Diff antigen NEGATIVE NEGATIVE Final   C Diff toxin NEGATIVE NEGATIVE Final   C Diff interpretation No C. difficile detected.  Final    Comment: Performed at [facility], [place] [address] [place], [place] [address]  Gastrointestinal Panel by PCR , Stool     Status: None   Collection Time: 02/22/18 12:09 PM  Result Value Ref Range Status   Campylobacter species NOT DETECTED NOT DETECTED Final   Plesimonas shigelloides NOT DETECTED NOT DETECTED Final   Salmonella species NOT DETECTED NOT DETECTED Final   Yersinia enterocolitica NOT DETECTED NOT DETECTED Final   Vibrio species NOT DETECTED NOT DETECTED Final   Vibrio cholerae NOT DETECTED NOT DETECTED Final   Enteroaggregative E coli (EAEC) NOT DETECTED NOT DETECTED Final   Enteropathogenic E coli (EPEC) NOT DETECTED NOT DETECTED Final   Enterotoxigenic E coli (ETEC) NOT DETECTED NOT DETECTED Final   Shiga like toxin producing E coli (STEC) NOT DETECTED NOT DETECTED Final   Shigella/Enteroinvasive E coli (EIEC) NOT DETECTED NOT DETECTED Final   Cryptosporidium NOT DETECTED NOT DETECTED Final   Cyclospora cayetanensis NOT DETECTED NOT DETECTED Final   Entamoeba histolytica NOT DETECTED NOT DETECTED Final   Giardia lamblia NOT DETECTED NOT  DETECTED Final   Adenovirus F40/41 NOT DETECTED NOT DETECTED Final   Astrovirus NOT DETECTED NOT DETECTED Final   Norovirus GI/GII NOT DETECTED NOT DETECTED Final   Rotavirus A NOT DETECTED NOT DETECTED Final   Sapovirus (I, II, IV, and V) NOT DETECTED NOT DETECTED Final    Comment: Performed at [facility], [address], [place], [place] [address]         Radiology Studies: No results found.      Scheduled Meds: . ALPRAZolam  0.25-0.5 mg Oral QHS  . aspirin EC  81 mg Oral Q M,W,F  . atorvastatin  80 mg Oral Q supper  . cholecalciferol  1,000 Units Oral Daily  . ferrous sulfate  325 mg Oral BID WC  . furosemide  40 mg Oral Daily  . hydrALAZINE  100 mg Oral TID  . hydrocortisone cream   Topical BID  . insulin aspart  0-5 Units Subcutaneous QHS  . insulin aspart  0-9 Units Subcutaneous TID WC  . irbesartan  300 mg Oral Daily  . magnesium oxide  400 mg Oral Daily  . metoprolol tartrate  50 mg Oral BID  . pantoprazole  40 mg Oral Daily  . saccharomyces boulardii  250 mg Oral BID  . vitamin B-12  100 mcg Oral Daily   Continuous Infusions: . cefTRIAXone (ROCEPHIN)  IV Stopped (02/24/18 2215)     LOS: 4 days    Time spent: 25 mins.More than 50% of that time was spent in counseling and/or coordination of care.      Reyne Dumas, MD [facility] Pager [phone]  If 7PM-7AM, please contact night-coverage [email or web address] Password [facility] 02/25/2018, 12:21 PM

## 2018-02-26 DIAGNOSIS — I481 Persistent atrial fibrillation: Secondary | ICD-10-CM

## 2018-02-26 LAB — BPAM FFP
BLOOD PRODUCT EXPIRATION DATE: 201905222359
Blood Product Expiration Date: 201905222359
ISSUE DATE / TIME: 201905171357
ISSUE DATE / TIME: 201905191102
UNIT TYPE AND RH: 8400
Unit Type and Rh: 8400

## 2018-02-26 LAB — COMPREHENSIVE METABOLIC PANEL
ALT: 16 U/L (ref 14–54)
AST: 36 U/L (ref 15–41)
Albumin: 2.4 g/dL — ABNORMAL LOW (ref 3.5–5.0)
Alkaline Phosphatase: 98 U/L (ref 38–126)
Anion gap: 9 (ref 5–15)
BILIRUBIN TOTAL: 0.9 mg/dL (ref 0.3–1.2)
BUN: 10 mg/dL (ref 6–20)
CO2: 22 mmol/L (ref 22–32)
Calcium: 8.5 mg/dL — ABNORMAL LOW (ref 8.9–10.3)
Chloride: 100 mmol/L — ABNORMAL LOW (ref 101–111)
Creatinine, Ser: 0.94 mg/dL (ref 0.44–1.00)
GFR calc Af Amer: >60 mL/min (ref >60)
GFR calc non Af Amer: 56 mL/min — ABNORMAL LOW (ref >60)
GLUCOSE: 123 mg/dL — AB (ref 65–99)
POTASSIUM: 4 mmol/L (ref 3.5–5.1)
SODIUM: 131 mmol/L — AB (ref 135–145)
Total Protein: 6.7 g/dL (ref 6.5–8.1)

## 2018-02-26 LAB — CBC
HEMATOCRIT: 35.2 % — AB (ref 36.0–46.0)
HEMOGLOBIN: 10.8 g/dL — AB (ref 12.0–15.0)
MCH: 22.7 pg — ABNORMAL LOW (ref 26.0–34.0)
MCHC: 30.7 g/dL (ref 30.0–36.0)
MCV: 73.9 fL — ABNORMAL LOW (ref 78.0–100.0)
Platelets: 270 10*3/uL (ref 150–400)
RBC: 4.76 MIL/uL (ref 3.87–5.11)
RDW: 18.3 % — AB (ref 11.5–15.5)
WBC: 4.8 10*3/uL (ref 4.0–10.5)

## 2018-02-26 LAB — GLUCOSE, CAPILLARY
GLUCOSE-CAPILLARY: 119 mg/dL — AB (ref 65–99)
GLUCOSE-CAPILLARY: 142 mg/dL — AB (ref 65–99)
Glucose-Capillary: 141 mg/dL — ABNORMAL HIGH (ref 65–99)
Glucose-Capillary: 167 mg/dL — ABNORMAL HIGH (ref 65–99)

## 2018-02-26 LAB — PREPARE FRESH FROZEN PLASMA: Unit division: 0

## 2018-02-26 LAB — PROTIME-INR
INR: 2.86
INR: 2.73
PROTHROMBIN TIME: 29.8 s — AB (ref 11.4–15.2)
PROTHROMBIN TIME: 28.7 s — AB (ref 11.4–15.2)

## 2018-02-26 MED ORDER — FUROSEMIDE 40 MG PO TABS
60.0000 mg | ORAL_TABLET | Freq: Every day | ORAL | Status: DC
  Administered 2018-02-27 – 2018-03-02 (×4): 60 mg via ORAL
  Filled 2018-02-26 (×4): qty 1

## 2018-02-26 NOTE — Progress Notes (Signed)
ANTICOAGULATION CONSULT NOTE - Follow Up Consult  Pharmacy Consult for Heparin Indication: atrial fibrillation and mechanical valve  Allergies  Allergen Reactions  . Flagyl [Metronidazole] Rash    ALL-OVER BODY RASH  . Coreg [Carvedilol] Other (See Comments)    Terrible cramping in the feet and had a lot of bowel movements, but not diarrhea  . Losartan Swelling    Patient doesn't recall site of swelling  . Spironolactone Nausea Only    Caused extreme nausea  . Verapamil Hives  . Zetia [Ezetimibe] Other (See Comments)    Reaction not recalled  . Zocor [Simvastatin - High Dose] Other (See Comments)    Reaction not recalled    Patient Measurements: Height: 5\' 1"  (154.9 cm) Weight: 143 lb 3.2 oz (65 kg) IBW/kg (Calculated) : 47.8   Vital Signs: Temp: 99.8 F (37.7 C) (05/21 0500) Temp Source: Oral (05/21 0500) BP: 159/59 (05/21 0830) Pulse Rate: 88 (05/21 0500)  Labs: Recent Labs    02/24/18 0450 02/24/18 1401 02/25/18 0514 02/26/18 0456  HGB 10.0*  --   --  10.8*  HCT 32.7*  --   --  35.2*  PLT 257  --   --  270  LABPROT 49.6* 37.1* 35.2* 29.8*  INR 5.50* 3.79 3.55 2.86  CREATININE 1.04*  --  1.00 0.94   Estimated Creatinine Clearance: 41.9 mL/min (by C-G formula based on SCr of 0.94 mg/dL).  Medications:  Last dose of Coumadin given 5/16  Assessment: 77 year old female admitted with cholecystitis who may need cholecystectomy. She is on chronic anticoagulation with Coumadin for a mechanical heart valve and atrial fibrillation. Her Coumadin has been on hold since 5/16 for possible intervention. The INR is trending down, but remains supratherapeutic today (3.55>2.86). She will begin heparin when her INR is <2.5, likely w/n the next 24 hours.  Goal of Therapy:  Heparin level 0.3-0.7 units/ml Monitor platelets by anticoagulation protocol: Yes   Plan:  Daily PT/INR monitoring, start heparin when INR <2.5  Georga Bora, PharmD Clinical Pharmacist 02/26/2018  9:41 AM

## 2018-02-26 NOTE — Plan of Care (Signed)
  Problem: Nutrition: Goal: Adequate nutrition will be maintained Outcome: Progressing   

## 2018-02-26 NOTE — Progress Notes (Signed)
[facility] Progress Note     Subjective: CC: diarrhea Patient still having diarrhea, unsure of whether adding probiotic helps. Denies abdominal pain, nausea or vomiting. Concerned about results of echo and had a lot of questions about heart disease in general - may benefit from more in depth discussion of this. Noted swelling in feet is the worst its been.   Objective: Vital signs in last 24 hours: Temp:  [98.1 F (36.7 C)-99.8 F (37.7 C)] 99.8 F (37.7 C) (05/21 0500) Pulse Rate:  [84-88] 88 (05/21 0500) BP: (139-163)/(58-75) 159/59 (05/21 0830) SpO2:  [93 %-94 %] 93 % (05/21 0500) Weight:  [65 kg (143 lb 3.2 oz)] 65 kg (143 lb 3.2 oz) (05/21 0500) Last BM Date: 02/25/18  Intake/Output from previous day: 05/20 0701 - 05/21 0700 In: 1165 [P.O.:840; IV Piggyback:300] Out: -  Intake/Output this shift: No intake/output data recorded.  PE: Gen:  Alert, NAD, pleasant Card:  Irregularly irregular, pedal pulses 1+ BL, feet very edematous bilaterally  Pulm:  Normal effort, clear to auscultation bilaterally Abd: Soft, minimally TTP in RUQ, non-distended, bowel sounds present in all 4 quadrants, no HSM Skin: warm and dry, no rashes  Psych: A&Ox3     Lab Results:  Recent Labs    02/24/18 0450 02/26/18 0456  WBC 4.0 4.8  HGB 10.0* 10.8*  HCT 32.7* 35.2*  PLT 257 270   BMET Recent Labs    02/25/18 0514 02/26/18 0456  NA 130* 131*  K 4.1 4.0  CL 98* 100*  CO2 21* 22  GLUCOSE 106* 123*  BUN 13 10  CREATININE 1.00 0.94  CALCIUM 8.3* 8.5*   PT/INR Recent Labs    02/25/18 0514 02/26/18 0456  LABPROT 35.2* 29.8*  INR 3.55 2.86   CMP     Component Value Date/Time   NA 131 (L) 02/26/2018 0456   NA 137 12/06/2017 1436   K 4.0 02/26/2018 0456   CL 100 (L) 02/26/2018 0456   CO2 22 02/26/2018 0456   GLUCOSE 123 (H) 02/26/2018 0456   BUN 10 02/26/2018 0456   BUN 20 12/06/2017 1436   CREATININE 0.94 02/26/2018 0456   CALCIUM 8.5 (L) 02/26/2018 0456    PROT 6.7 02/26/2018 0456   ALBUMIN 2.4 (L) 02/26/2018 0456   AST 36 02/26/2018 0456   ALT 16 02/26/2018 0456   ALKPHOS 98 02/26/2018 0456   BILITOT 0.9 02/26/2018 0456   GFRNONAA 56 (L) 02/26/2018 0456   GFRAA >60 02/26/2018 0456   Lipase     Component Value Date/Time   LIPASE 47 02/21/2018 1519       Studies/Results: No results found.  Anti-infectives: Anti-infectives (From admission, onward)   Start     Dose/Rate Route Frequency Ordered Stop   02/25/18 1300  Ampicillin-Sulbactam (UNASYN) 3 g in sodium chloride 0.9 % 100 mL IVPB     3 g 200 mL/hr over 30 Minutes Intravenous Every 8 hours 02/25/18 1245     02/22/18 2200  cefTRIAXone (ROCEPHIN) 2 g in sodium chloride 0.9 % 100 mL IVPB  Status:  Discontinued     2 g 200 mL/hr over 30 Minutes Intravenous Every 24 hours 02/21/18 2312 02/25/18 1222   02/21/18 2330  cefTRIAXone (ROCEPHIN) 1 g in sodium chloride 0.9 % 100 mL IVPB     1 g 200 mL/hr over 30 Minutes Intravenous  Once 02/21/18 2317 02/22/18 0344   02/21/18 2215  cefTRIAXone (ROCEPHIN) 1 g in sodium chloride 0.9 % 100 mL IVPB  1 g 200 mL/hr over 30 Minutes Intravenous  Once 02/21/18 2210 02/21/18 2334       Assessment/Plan Afib AVRin 2005- INR 2.86today, cardiology prefers to allow INR to drift down naturally and begin heparin when INR<2.5 CAD DM HTN HLD PAD  RUQ abdominal pain, cholelithiasis Acute cholecystitis -u/s 5/17 showed gallstones with gallbladder wall thickening and slight pericholecystic fluid. The appearance of the gallbladder is felt to be indicative of acute cholecystitis - LFTs WNL (5/16) - WBC 4.8 5/19, afebrile - cleared by cardiology for surgery with moderate risk Diarrhea - C. Diff negative and GI panel negative on 5/17, added probiotic 5/20  ID -rocephin 5/16>> FEN -IVF, CLD VTE -SCDs Foley -none Follow up -TBD  Plan- Plan for lap chole once INR normalizes, ?potentially Thursday. Continue clears and IV  antibiotics for now.    LOS: 5 days    Brigid Re , [facility] 02/26/2018, 9:01 AM Pager: [phone] Consults: [phone] Mon-Fri 7:00 am-4:30 pm Sat-Sun 7:00 am-11:30 am

## 2018-02-26 NOTE — Progress Notes (Signed)
PROGRESS NOTE    [name]  ATF:[id number] DOB: [birth year] DOA: 02/21/2018 PCP: [name], MD   Brief Narrative: Patient is a 77 -year-old female with past medical history significant for hypertension, hyperlipidemia, diabetes mellitus, GERD, atrial fibrillation, atrial valve replacement with mechanical valve on Coumadin, peripheral vascular disease, carotid artery stenosis, diastolic CHF, CAD, GI bleed who presents to the emergency department with complaints of abdominal pain, nausea and vomiting.   She was reporting to have abdominal pain on and off for last 3 months with intermittent nausea, vomiting.  Patient was also reported to have diarrhea. CT abdomen/pelvis done here showed cholelithiasis with pericholecystatic fluid.  Ultrasound done here was positive  for cholecystitis cholecystitis.  She has been started on antibiotics.  General surgery consulted for possible cholecystectomy once INR normalizes.     Assessment & Plan:   Principal Problem:   Cholecystitis Active Problems:   Hypertension   Hypercholesteremia   Mechanical heart valve present   Chronic anticoagulation   Coronary artery disease   Diabetes mellitus without complication (HCC)   Atrial fibrillation (HCC)   Cholelithiasis   GERD (gastroesophageal reflux disease)   Anxiety   Chronic diastolic CHF (congestive heart failure) (HCC)   Diarrhea   Rash   UTI (urinary tract infection)   Cholecystitis/cholelithiasis: General surgery following.  Plan for lap cholecystectomy  after her INR is optimal, as per surgery notes, potentially on Thursday. Currently on Unasyn for better anaerobic coverage.  Continue pain management, Zofran for nausea.  Cardiology cleared for surgery. Abdomen pain much better this morning.  She looks comfortable.  Mechanical Aortic valve: Status post aortic valve replacement.  Was on Coumadin at home.  INR was supratherapeutic ,plan is to avoid vitamin K to avoid difficulty in bringing  her INR back after surgery.Was waiting for INR to drift down naturally. INR drifting down slowly,   Lets hope INR will come down further tomorrow so she can go for lap cholecystectomy. Will start on heparin drip when INR falls below 2.5. Mechanical valve prosthesis functioning normally on echo  Diarrhea: C. difficile suspected. Diarrhea has stopped. Negative  PCR for C. difficile and GI pathogen panel.  Anemia: Hemoglobin the range of 10 today.  FOBT negative.  Anemia  Panel shows low iron.  Started on iron supplementation.  S/P  2 units of PRBC . Hemoglobin stable around 10-11    Hypertension: Currently blood pressure stable.  Continue irbesartan and metoprolol.  Hypercholesterolemia: Continue Lipitor  History of coronary artery disease: Denies any chest pain at present.  Continue aspirin, Lipitor and metoprolol.  Diabetes mellitus: Last hemoglobin A1c was 6.4 on 12/12.  Patient was on metformin and glipizide at home.  We will continue sliding scale insulin here.[name]  History of atrial fibrillation: CHA2DS2-VASc Score is 7. Patient is on Coumadin at home. Heart rate is controlled.  On metoprolol.  GERD: Continue Protonix  Chronic diastolic CHF: Last 2D echo showed ejection fraction of 60 to 65%.  She has trace pedal edema.  BNP slightly elevated on presentation.  Does not complain of any shortness of breath. Complains of bilateral lower extremity edema which I feel is related to patient's worsening pulmonary hypertension.  Takes Lasix 60 mg at home. Currently on Lasix 40 mg, increase to 60 mg to help with the swelling in her legs. New echocardiogram done here showed ejection fraction of 55 to 27%, grade 3 diastolic dysfunction, functioning mechanical aortic valve. Worsening pulmonary hypertension.  Hyponatremia: Stable around 1:30 to 131  on Lasix,  We will continue to monitor  Possible UTI: Urinalysis was positive UTI. Denies any dysuria. Follow urine culture      DVT prophylaxis:  Supratherapeutic INR Code Status: Full code Family Communication: None present at the bedside Disposition Plan:  Awaiting for cholecystectomy   Consultants: Cardiology, general surgery  Procedures: None  Antimicrobials: Ceftriaxone since 02/21/18  Subjective:  Complaining of bilateral lower extremity edema, continues to have intermittent low-grade fever  Objective: Vitals:   02/25/18 1639 02/25/18 2048 02/26/18 0500 02/26/18 0830  BP: 139/75 (!) 163/67 (!) 158/59 (!) 159/59  Pulse: 84 85 88   Resp:      Temp: 98.1 F (36.7 C) 98.9 F (37.2 C) 99.8 F (37.7 C)   TempSrc: Oral Oral Oral   SpO2: 94% 93% 93%   Weight:   65 kg (143 lb 3.2 oz)   Height:        Intake/Output Summary (Last 24 hours) at 02/26/2018 1017 Last data filed at 02/26/2018 0947 Gross per 24 hour  Intake 923 ml  Output -  Net 923 ml   Filed Weights   02/24/18 0500 02/25/18 0456 02/26/18 0500  Weight: 66.4 kg (146 lb 6.2 oz) 65.2 kg (143 lb 11.2 oz) 65 kg (143 lb 3.2 oz)    Examination:  General exam: Appears calm and comfortable ,Not in distress,average built HEENT:PERRL,Oral mucosa moist, Ear/Nose normal on gross exam Respiratory system: Bilateral equal air entry, normal vesicular breath sounds, no wheezes or crackles  Cardiovascular system: S1 & S2 heard, RRR. No JVD, murmurs, rubs, gallops or clicks. Gastrointestinal system: Abdomen is nondistended, soft . No organomegaly or masses felt. Normal bowel sounds heard.mild right upper quadrant tenderness. Central nervous system: Alert and oriented. No focal neurological deficits. Extremities: 1+ edema, no clubbing ,no cyanosis, distal peripheral pulses palpable. Skin: Purpuric rashes on the bilateral lower extremities, no lesions or ulcers,no icterus ,no pallor Psychiatry: Judgement and insight appear normal. Mood & affect appropriate.     Data Reviewed: I have personally reviewed following labs and imaging studies  CBC: Recent Labs  Lab  02/21/18 1519 02/22/18 0259 02/23/18 0711 02/24/18 0450 02/26/18 0456  WBC 4.7 3.0* 3.9* 4.0 4.8  NEUTROABS  --   --  2.9 3.1  --   HGB 8.0* 7.1* 9.8* 10.0* 10.8*  HCT 26.5* 23.3* 31.9* 32.7* 35.2*  MCV 73.6* 72.8* 73.7* 73.8* 73.9*  PLT 298 250 267 257 829   Basic Metabolic Panel: Recent Labs  Lab 02/21/18 1519 02/22/18 0259 02/24/18 0450 02/25/18 0514 02/26/18 0456  NA 133* 132* 130* 130* 131*  K 4.4 4.0 4.0 4.1 4.0  CL 104 103 99* 98* 100*  CO2 20* 20* 22 21* 22  GLUCOSE 90 70 90 106* 123*  BUN 15 15 15 13 10   CREATININE 0.97 0.97 1.04* 1.00 0.94  CALCIUM 8.5* 8.2* 8.3* 8.3* 8.5*   GFR: Estimated Creatinine Clearance: 41.9 mL/min (by C-G formula based on SCr of 0.94 mg/dL). Liver Function Tests: Recent Labs  Lab 02/21/18 1519 02/26/18 0456  AST 32 36  ALT 17 16  ALKPHOS 89 98  BILITOT 0.8 0.9  PROT 7.2 6.7  ALBUMIN 2.6* 2.4*   Recent Labs  Lab 02/21/18 1519  LIPASE 47   No results for input(s): AMMONIA in the last 168 hours. Coagulation Profile: Recent Labs  Lab 02/23/18 0711 02/24/18 0450 02/24/18 1401 02/25/18 0514 02/26/18 0456  INR 4.56* 5.50* 3.79 3.55 2.86   Cardiac Enzymes: No results for input(s): CKTOTAL, CKMB,  CKMBINDEX, TROPONINI in the last 168 hours. BNP (last 3 results) No results for input(s): PROBNP in the last 8760 hours. HbA1C: No results for input(s): HGBA1C in the last 72 hours. CBG: Recent Labs  Lab 02/25/18 0737 02/25/18 1221 02/25/18 1629 02/25/18 2053 02/26/18 0737  GLUCAP 100* 117* 96 149* 119*   Lipid Profile: No results for input(s): CHOL, HDL, LDLCALC, TRIG, CHOLHDL, LDLDIRECT in the last 72 hours. Thyroid Function Tests: No results for input(s): TSH, T4TOTAL, FREET4, T3FREE, THYROIDAB in the last 72 hours. Anemia Panel: No results for input(s): VITAMINB12, FOLATE, FERRITIN, TIBC, IRON, RETICCTPCT in the last 72 hours. Sepsis Labs: Recent Labs  Lab 02/21/18 2354 02/22/18 0259  PROCALCITON 0.19  --     LATICACIDVEN 1.0 1.0    Recent Results (from the past 240 hour(s))  Culture, blood (Routine X 2) w Reflex to ID Panel     Status: None (Preliminary result)   Collection Time: 02/22/18 12:00 AM  Result Value Ref Range Status   Specimen Description BLOOD SITE NOT SPECIFIED  Final   Special Requests   Final    BOTTLES DRAWN AEROBIC AND ANAEROBIC Blood Culture adequate volume   Culture   Final    NO GROWTH 3 DAYS Performed at [facility], [place] [address], [place], [place] [address]    Report Status PENDING  Incomplete  Culture, blood (Routine X 2) w Reflex to ID Panel     Status: None (Preliminary result)   Collection Time: 02/22/18  2:54 AM  Result Value Ref Range Status   Specimen Description BLOOD LEFT HAND  Final   Special Requests   Final    BOTTLES DRAWN AEROBIC ONLY Blood Culture adequate volume   Culture   Final    NO GROWTH 3 DAYS Performed at [facility], [place] [address] [place], [place] [address]    Report Status PENDING  Incomplete  C difficile quick scan w PCR reflex     Status: None   Collection Time: 02/22/18 12:09 PM  Result Value Ref Range Status   C Diff antigen NEGATIVE NEGATIVE Final   C Diff toxin NEGATIVE NEGATIVE Final   C Diff interpretation No C. difficile detected.  Final    Comment: Performed at [facility], [place] [address] [place], [place] [address]  Gastrointestinal Panel by PCR , Stool     Status: None   Collection Time: 02/22/18 12:09 PM  Result Value Ref Range Status   Campylobacter species NOT DETECTED NOT DETECTED Final   Plesimonas shigelloides NOT DETECTED NOT DETECTED Final   Salmonella species NOT DETECTED NOT DETECTED Final   Yersinia enterocolitica NOT DETECTED NOT DETECTED Final   Vibrio species NOT DETECTED NOT DETECTED Final   Vibrio cholerae NOT DETECTED NOT DETECTED Final   Enteroaggregative E coli (EAEC) NOT DETECTED NOT DETECTED Final   Enteropathogenic E coli (EPEC) NOT DETECTED NOT DETECTED Final    Enterotoxigenic E coli (ETEC) NOT DETECTED NOT DETECTED Final   Shiga like toxin producing E coli (STEC) NOT DETECTED NOT DETECTED Final   Shigella/Enteroinvasive E coli (EIEC) NOT DETECTED NOT DETECTED Final   Cryptosporidium NOT DETECTED NOT DETECTED Final   Cyclospora cayetanensis NOT DETECTED NOT DETECTED Final   Entamoeba histolytica NOT DETECTED NOT DETECTED Final   Giardia lamblia NOT DETECTED NOT DETECTED Final   Adenovirus F40/41 NOT DETECTED NOT DETECTED Final   Astrovirus NOT DETECTED NOT DETECTED Final   Norovirus GI/GII NOT DETECTED NOT DETECTED Final  Rotavirus A NOT DETECTED NOT DETECTED Final   Sapovirus (I, II, IV, and V) NOT DETECTED NOT DETECTED Final    Comment: Performed at [facility], [address] [place], [place] [address]         Radiology Studies: No results found.      Scheduled Meds: . ALPRAZolam  0.25-0.5 mg Oral QHS  . aspirin EC  81 mg Oral Q M,W,F  . atorvastatin  80 mg Oral Q supper  . cholecalciferol  1,000 Units Oral Daily  . ferrous sulfate  325 mg Oral BID WC  . furosemide  40 mg Oral Daily  . hydrALAZINE  100 mg Oral TID  . hydrocortisone cream   Topical BID  . insulin aspart  0-5 Units Subcutaneous QHS  . insulin aspart  0-9 Units Subcutaneous TID WC  . irbesartan  300 mg Oral Daily  . magnesium oxide  400 mg Oral Daily  . metoprolol tartrate  50 mg Oral BID  . pantoprazole  40 mg Oral Daily  . saccharomyces boulardii  250 mg Oral BID  . vitamin B-12  100 mcg Oral Daily   Continuous Infusions: . ampicillin-sulbactam (UNASYN) IV Stopped (02/26/18 0634)     LOS: 5 days    Time spent: 25 mins.More than 50% of that time was spent in counseling and/or coordination of care.      Reyne Dumas, MD [facility]    If 7PM-7AM, please contact night-coverage [email or web address] Password [facility] 02/26/2018, 10:17 AM

## 2018-02-26 NOTE — Progress Notes (Signed)
ANTICOAGULATION CONSULT NOTE - Follow Up Consult  Pharmacy Consult for Heparin Indication: atrial fibrillation and mechanical valve  Medications:  Last dose of Coumadin given 5/16  Assessment: 77 year old female admitted with cholecystitis who may need cholecystectomy. She is on chronic anticoagulation with Coumadin for a mechanical heart valve and atrial fibrillation.   INR still elevated this evening at 2.7. Continue to hold heparin tonight.   Goal of Therapy:  Heparin level 0.3-0.7 units/ml Monitor platelets by anticoagulation protocol: Yes   Plan:  Daily PT/INR monitoring, start heparin when INR <2.5  [name] Hearing PharmD., BCPS Clinical Pharmacist 02/26/2018 10:07 PM

## 2018-02-27 LAB — BASIC METABOLIC PANEL
Anion gap: 10 (ref 5–15)
BUN: 8 mg/dL (ref 6–20)
CHLORIDE: 101 mmol/L (ref 101–111)
CO2: 21 mmol/L — ABNORMAL LOW (ref 22–32)
Calcium: 8.3 mg/dL — ABNORMAL LOW (ref 8.9–10.3)
Creatinine, Ser: 0.82 mg/dL (ref 0.44–1.00)
GFR calc Af Amer: >60 mL/min (ref >60)
GFR calc non Af Amer: >60 mL/min (ref >60)
GLUCOSE: 119 mg/dL — AB (ref 65–99)
POTASSIUM: 3.6 mmol/L (ref 3.5–5.1)
Sodium: 132 mmol/L — ABNORMAL LOW (ref 135–145)

## 2018-02-27 LAB — PROTIME-INR
INR: 2.56
INR: 2.12
Prothrombin Time: 27.3 seconds — ABNORMAL HIGH (ref 11.4–15.2)
Prothrombin Time: 23.6 seconds — ABNORMAL HIGH (ref 11.4–15.2)

## 2018-02-27 LAB — GLUCOSE, CAPILLARY
GLUCOSE-CAPILLARY: 104 mg/dL — AB (ref 65–99)
GLUCOSE-CAPILLARY: 190 mg/dL — AB (ref 65–99)
GLUCOSE-CAPILLARY: 111 mg/dL — AB (ref 65–99)
Glucose-Capillary: 147 mg/dL — ABNORMAL HIGH (ref 65–99)

## 2018-02-27 LAB — CULTURE, BLOOD (ROUTINE X 2)
CULTURE: NO GROWTH
Culture: NO GROWTH
SPECIAL REQUESTS: ADEQUATE
SPECIAL REQUESTS: ADEQUATE

## 2018-02-27 LAB — HEPARIN LEVEL (UNFRACTIONATED): Heparin Unfractionated: <0.1 IU/mL — ABNORMAL LOW (ref 0.30–0.70)

## 2018-02-27 MED ORDER — HEPARIN (PORCINE) IN NACL 100-0.45 UNIT/ML-% IJ SOLN
1450.0000 [IU]/h | INTRAMUSCULAR | Status: AC
  Administered 2018-02-27: 850 [IU]/h via INTRAVENOUS
  Administered 2018-02-28: 1300 [IU]/h via INTRAVENOUS
  Filled 2018-02-27 (×2): qty 250

## 2018-02-27 NOTE — Progress Notes (Signed)
PROGRESS NOTE    [name]  XBM:[id number] DOB: [birth year] DOA: 02/21/2018 PCP: [name], MD   Brief Narrative: Patient is a 77 -year-old female with past medical history significant for hypertension, hyperlipidemia, diabetes mellitus, GERD, atrial fibrillation, atrial valve replacement with mechanical valve on Coumadin, peripheral vascular disease, carotid artery stenosis, diastolic CHF, CAD, GI bleed who presents to the emergency department with complaints of abdominal pain, nausea and vomiting.   She was reporting to have abdominal pain on and off for last 3 months with intermittent nausea, vomiting.  Patient was also reported to have diarrhea. CT abdomen/pelvis done here showed cholelithiasis with pericholecystatic fluid.  Ultrasound done here was positive  for cholecystitis cholecystitis.  She has been started on antibiotics.  General surgery consulted for possible cholecystectomy once INR normalizes.     Assessment & Plan:   Principal Problem:   Cholecystitis Active Problems:   Hypertension   Hypercholesteremia   Mechanical heart valve present   Chronic anticoagulation   Coronary artery disease   Diabetes mellitus without complication (HCC)   Atrial fibrillation (HCC)   Cholelithiasis   GERD (gastroesophageal reflux disease)   Anxiety   Chronic diastolic CHF (congestive heart failure) (HCC)   Diarrhea   Rash   UTI (urinary tract infection)   Cholecystitis/cholelithiasis: General surgery following, tolerating liquid diet, abdominal pain improving plan for lap cholecystectomy  after her INR is optimal, as per surgery notes, potentially on Thursday. Currently on Unasyn for better anaerobic coverage.  Continue pain management, Zofran for nausea.  Cardiology cleared for surgery.   Mechanical Aortic valve: Status post aortic valve replacement.  Was on Coumadin at home.  INR was supratherapeutic ,plan is to avoid vitamin K to avoid difficulty in bringing her INR back  after surgery.Was waiting for INR to drift down naturally. INR drifting down slowly,   Will start on heparin drip when INR falls below 2.5. Mechanical valve prosthesis functioning normally on echo  Diarrhea: C. difficile suspected. Diarrhea has stopped. Negative  PCR for C. difficile and GI pathogen panel.  Anemia: Hemoglobin the range of 10 . FOBT negative.  Anemia  Panel shows low iron.  Started on iron supplementation.  S/P  2 units of PRBC . Hemoglobin stable around 10-11    Hypertension: Currently blood pressure stable.  Continue irbesartan and metoprolol.  Hypercholesterolemia: Continue Lipitor  History of coronary artery disease: Denies any chest pain at present.  Continue aspirin, Lipitor and metoprolol.  Diabetes mellitus: Last hemoglobin A1c was 6.4 on 12/12.  Patient was on metformin and glipizide at home.  We will continue sliding scale insulin here.[name]  History of atrial fibrillation: CHA2DS2-VASc Score is 7. Patient is on Coumadin at home. Heart rate is controlled.  On metoprolol.  GERD: Continue Protonix  Chronic diastolic CHF: Last 2D echo showed ejection fraction of 60 to 65%.  She has trace pedal edema.  BNP slightly elevated on presentation.  Does not complain of any shortness of breath. Complains of bilateral lower extremity edema which I feel is related to patient's worsening pulmonary hypertension.  Takes Lasix 60 mg at home. Currently on Lasix 40 mg, increase to 60 mg to help with the swelling in her legs. New echocardiogram done here showed ejection fraction of 55 to 02%, grade 3 diastolic dysfunction, functioning mechanical aortic valve. Worsening pulmonary hypertension.  Hyponatremia: Stable around 1:30 to 131 on Lasix,  We will continue to monitor  Possible UTI: Urinalysis was positive UTI. Denies any dysuria. Follow urine  culture      DVT prophylaxis: Supratherapeutic INR Code Status: Full code Family Communication: None present at the bedside Disposition  Plan:  Awaiting for cholecystectomy   Consultants: Cardiology, general surgery  Procedures: None  Antimicrobials: Ceftriaxone since 02/21/18  Subjective:  Complaining of bilateral lower extremity edema, continues to have intermittent low-grade fever  Objective: Vitals:   02/27/18 0500 02/27/18 0846 02/27/18 1420 02/27/18 1728  BP:  (!) 157/58 (!) 165/55 (!) 189/55  Pulse:  98 61   Resp:      Temp:   98.2 F (36.8 C)   TempSrc:   Oral   SpO2:   95%   Weight: 64.3 kg (141 lb 11.2 oz)     Height:        Intake/Output Summary (Last 24 hours) at 02/27/2018 2001 Last data filed at 02/27/2018 1800 Gross per 24 hour  Intake 1533.01 ml  Output -  Net 1533.01 ml   Filed Weights   02/25/18 0456 02/26/18 0500 02/27/18 0500  Weight: 65.2 kg (143 lb 11.2 oz) 65 kg (143 lb 3.2 oz) 64.3 kg (141 lb 11.2 oz)    Examination:   Physical Exam  Gen:- Awake Alert,  In no apparent distress  HEENT:- [place].AT, No sclera icterus Neck-Supple Neck,No JVD,.  Lungs-  CTAB , with good air movement CV- S1, S2 normal Abd-  +ve B.Sounds, Abd Soft, right upper quadrant tenderness without rebound or guarding    Extremity/Skin:- No  edema,   +ve pulses Psych-affect is appropriate, oriented x3 Neuro-no new focal deficits, no tremors     Data Reviewed: I have personally reviewed following labs and imaging studies  CBC: Recent Labs  Lab 02/21/18 1519 02/22/18 0259 02/23/18 0711 02/24/18 0450 02/26/18 0456  WBC 4.7 3.0* 3.9* 4.0 4.8  NEUTROABS  --   --  2.9 3.1  --   HGB 8.0* 7.1* 9.8* 10.0* 10.8*  HCT 26.5* 23.3* 31.9* 32.7* 35.2*  MCV 73.6* 72.8* 73.7* 73.8* 73.9*  PLT 298 250 267 257 474   Basic Metabolic Panel: Recent Labs  Lab 02/22/18 0259 02/24/18 0450 02/25/18 0514 02/26/18 0456 02/27/18 0445  NA 132* 130* 130* 131* 132*  K 4.0 4.0 4.1 4.0 3.6  CL 103 99* 98* 100* 101  CO2 20* 22 21* 22 21*  GLUCOSE 70 90 106* 123* 119*  BUN 15 15 13 10 8   CREATININE 0.97 1.04* 1.00  0.94 0.82  CALCIUM 8.2* 8.3* 8.3* 8.5* 8.3*   GFR: Estimated Creatinine Clearance: 47.8 mL/min (by C-G formula based on SCr of 0.82 mg/dL). Liver Function Tests: Recent Labs  Lab 02/21/18 1519 02/26/18 0456  AST 32 36  ALT 17 16  ALKPHOS 89 98  BILITOT 0.8 0.9  PROT 7.2 6.7  ALBUMIN 2.6* 2.4*   Recent Labs  Lab 02/21/18 1519  LIPASE 47   No results for input(s): AMMONIA in the last 168 hours. Coagulation Profile: Recent Labs  Lab 02/24/18 1401 02/25/18 0514 02/26/18 0456 02/26/18 2123 02/27/18 0445  INR 3.79 3.55 2.86 2.73 2.56   Cardiac Enzymes: No results for input(s): CKTOTAL, CKMB, CKMBINDEX, TROPONINI in the last 168 hours. BNP (last 3 results) No results for input(s): PROBNP in the last 8760 hours. HbA1C: No results for input(s): HGBA1C in the last 72 hours. CBG: Recent Labs  Lab 02/26/18 1647 02/26/18 2139 02/27/18 0737 02/27/18 1119 02/27/18 1717  GLUCAP 142* 167* 104* 190* 147*   Lipid Profile: No results for input(s): CHOL, HDL, LDLCALC, TRIG, CHOLHDL, LDLDIRECT in  the last 72 hours. Thyroid Function Tests: No results for input(s): TSH, T4TOTAL, FREET4, T3FREE, THYROIDAB in the last 72 hours. Anemia Panel: No results for input(s): VITAMINB12, FOLATE, FERRITIN, TIBC, IRON, RETICCTPCT in the last 72 hours. Sepsis Labs: Recent Labs  Lab 02/21/18 2354 02/22/18 0259  PROCALCITON 0.19  --   LATICACIDVEN 1.0 1.0    Recent Results (from the past 240 hour(s))  Culture, blood (Routine X 2) w Reflex to ID Panel     Status: None   Collection Time: 02/22/18 12:00 AM  Result Value Ref Range Status   Specimen Description BLOOD SITE NOT SPECIFIED  Final   Special Requests   Final    BOTTLES DRAWN AEROBIC AND ANAEROBIC Blood Culture adequate volume   Culture   Final    NO GROWTH 5 DAYS Performed at [facility], [address] [place], [place] [address]    Report Status 02/27/2018 FINAL  Final  Culture, blood (Routine X 2) w Reflex to ID  Panel     Status: None   Collection Time: 02/22/18  2:54 AM  Result Value Ref Range Status   Specimen Description BLOOD LEFT HAND  Final   Special Requests   Final    BOTTLES DRAWN AEROBIC ONLY Blood Culture adequate volume   Culture   Final    NO GROWTH 5 DAYS Performed at [facility], [place] [address] [place], [place] [address]    Report Status 02/27/2018 FINAL  Final  C difficile quick scan w PCR reflex     Status: None   Collection Time: 02/22/18 12:09 PM  Result Value Ref Range Status   C Diff antigen NEGATIVE NEGATIVE Final   C Diff toxin NEGATIVE NEGATIVE Final   C Diff interpretation No C. difficile detected.  Final    Comment: Performed at [facility], [place] [address], [place], [place] [address]  Gastrointestinal Panel by PCR , Stool     Status: None   Collection Time: 02/22/18 12:09 PM  Result Value Ref Range Status   Campylobacter species NOT DETECTED NOT DETECTED Final   Plesimonas shigelloides NOT DETECTED NOT DETECTED Final   Salmonella species NOT DETECTED NOT DETECTED Final   Yersinia enterocolitica NOT DETECTED NOT DETECTED Final   Vibrio species NOT DETECTED NOT DETECTED Final   Vibrio cholerae NOT DETECTED NOT DETECTED Final   Enteroaggregative E coli (EAEC) NOT DETECTED NOT DETECTED Final   Enteropathogenic E coli (EPEC) NOT DETECTED NOT DETECTED Final   Enterotoxigenic E coli (ETEC) NOT DETECTED NOT DETECTED Final   Shiga like toxin producing E coli (STEC) NOT DETECTED NOT DETECTED Final   Shigella/Enteroinvasive E coli (EIEC) NOT DETECTED NOT DETECTED Final   Cryptosporidium NOT DETECTED NOT DETECTED Final   Cyclospora cayetanensis NOT DETECTED NOT DETECTED Final   Entamoeba histolytica NOT DETECTED NOT DETECTED Final   Giardia lamblia NOT DETECTED NOT DETECTED Final   Adenovirus F40/41 NOT DETECTED NOT DETECTED Final   Astrovirus NOT DETECTED NOT DETECTED Final   Norovirus GI/GII NOT DETECTED NOT DETECTED Final   Rotavirus A NOT DETECTED NOT  DETECTED Final   Sapovirus (I, II, IV, and V) NOT DETECTED NOT DETECTED Final    Comment: Performed at [facility], [address] [place], [place] [address]         Radiology Studies: No results found.      Scheduled Meds: . ALPRAZolam  0.25-0.5 mg Oral QHS  . aspirin EC  81 mg Oral  Q M,W,F  . atorvastatin  80 mg Oral Q supper  . cholecalciferol  1,000 Units Oral Daily  . ferrous sulfate  325 mg Oral BID WC  . furosemide  60 mg Oral Daily  . hydrALAZINE  100 mg Oral TID  . hydrocortisone cream   Topical BID  . insulin aspart  0-5 Units Subcutaneous QHS  . insulin aspart  0-9 Units Subcutaneous TID WC  . irbesartan  300 mg Oral Daily  . magnesium oxide  400 mg Oral Daily  . metoprolol tartrate  50 mg Oral BID  . pantoprazole  40 mg Oral Daily  . saccharomyces boulardii  250 mg Oral BID  . vitamin B-12  100 mcg Oral Daily   Continuous Infusions: . ampicillin-sulbactam (UNASYN) IV Stopped (02/27/18 1311)  . heparin 850 Units/hr (02/27/18 1407)     LOS: 6 days    Time spent: 25 mins.More than 50% of that time was spent in counseling and/or coordination of care.      Roxan Hockey, MD [facility]    If 7PM-7AM, please contact night-coverage [email or web address] Password [facility] 02/27/2018, 8:01 PM

## 2018-02-27 NOTE — Consult Note (Signed)
   [facility] Inpatient Consult   02/27/2018  KAMBRIE EDDLEMAN 04/11/1941 [id number]    [facility] Care Management follow up.  Patient screened for [facility] Care Management services due to increased risk of unplanned readmission score of 23% (high).  Spoke with Mrs. Bowden at bedside to discuss [facility] Management program. She is agreeable and [facility] Care Management written consent obtained and folder provided.  Mrs. Ucci reports she lives alone. Confirms Primary Care MD is Dr. Osborne Casco (office listed as doing transition of care calls) Confirmed best contact number for Mrs. Schwarzkopf is [phone]. Denies having concerns with obtaining medications or transportation.  Will refer to [facility] for DM, CHF follow up.   [name], MSN-Ed, RN,BSN [facility] Liaison [phone]

## 2018-02-27 NOTE — Progress Notes (Signed)
[facility] Progress Note     Subjective: CC: diarrhea Patient DOES still report diarrhea. States she has had 2 loose stools already this AM, reports 6-7 BMs in the last 24h. Occasional abdominal pain that radiates to her back. No nausea or vomiting, tolerating FLD. SCDs helped with soreness in LEs last night, feet are still swollen.  Objective: Vital signs in last 24 hours: Temp:  [97.8 F (36.6 C)-98.4 F (36.9 C)] 98.4 F (36.9 C) (05/22 0452) Pulse Rate:  [82-98] 98 (05/22 0846) Resp:  [16-19] 19 (05/22 0452) BP: (153-157)/(58-70) 157/58 (05/22 0846) SpO2:  [92 %-96 %] 92 % (05/22 0452) Weight:  [64.3 kg (141 lb 11.2 oz)] 64.3 kg (141 lb 11.2 oz) (05/22 0500) Last BM Date: 02/26/18  Intake/Output from previous day: 05/21 0701 - 05/22 0700 In: 778 [P.O.:478; IV Piggyback:300] Out: -  Intake/Output this shift: No intake/output data recorded.  PE: Gen: Alert, NAD, pleasant Card:Irregularly irregular, pedal pulses1+ BL, feet very edematous bilaterally  Pulm: Normal effort, clear to auscultation bilaterally Abd: Soft,minimally TTP in RUQ, non-distended, bowel sounds present in all 4 quadrants, no HSM Skin: warm and dry, no rashes  Psych: A&Ox3    Lab Results:  Recent Labs    02/26/18 0456  WBC 4.8  HGB 10.8*  HCT 35.2*  PLT 270   BMET Recent Labs    02/26/18 0456 02/27/18 0445  NA 131* 132*  K 4.0 3.6  CL 100* 101  CO2 22 21*  GLUCOSE 123* 119*  BUN 10 8  CREATININE 0.94 0.82  CALCIUM 8.5* 8.3*   PT/INR Recent Labs    02/26/18 2123 02/27/18 0445  LABPROT 28.7* 27.3*  INR 2.73 2.56   CMP     Component Value Date/Time   NA 132 (L) 02/27/2018 0445   NA 137 12/06/2017 1436   K 3.6 02/27/2018 0445   CL 101 02/27/2018 0445   CO2 21 (L) 02/27/2018 0445   GLUCOSE 119 (H) 02/27/2018 0445   BUN 8 02/27/2018 0445   BUN 20 12/06/2017 1436   CREATININE 0.82 02/27/2018 0445   CALCIUM 8.3 (L) 02/27/2018 0445   PROT 6.7 02/26/2018  0456   ALBUMIN 2.4 (L) 02/26/2018 0456   AST 36 02/26/2018 0456   ALT 16 02/26/2018 0456   ALKPHOS 98 02/26/2018 0456   BILITOT 0.9 02/26/2018 0456   GFRNONAA >60 02/27/2018 0445   GFRAA >60 02/27/2018 0445   Lipase     Component Value Date/Time   LIPASE 47 02/21/2018 1519       Studies/Results: No results found.  Anti-infectives: Anti-infectives (From admission, onward)   Start     Dose/Rate Route Frequency Ordered Stop   02/25/18 1300  Ampicillin-Sulbactam (UNASYN) 3 g in sodium chloride 0.9 % 100 mL IVPB     3 g 200 mL/hr over 30 Minutes Intravenous Every 8 hours 02/25/18 1245     02/22/18 2200  cefTRIAXone (ROCEPHIN) 2 g in sodium chloride 0.9 % 100 mL IVPB  Status:  Discontinued     2 g 200 mL/hr over 30 Minutes Intravenous Every 24 hours 02/21/18 2312 02/25/18 1222   02/21/18 2330  cefTRIAXone (ROCEPHIN) 1 g in sodium chloride 0.9 % 100 mL IVPB     1 g 200 mL/hr over 30 Minutes Intravenous  Once 02/21/18 2317 02/22/18 0344   02/21/18 2215  cefTRIAXone (ROCEPHIN) 1 g in sodium chloride 0.9 % 100 mL IVPB     1 g 200 mL/hr over 30 Minutes Intravenous  Once 02/21/18 2210 02/21/18 2334       Assessment/Plan Afib AVRin 2005- INR2.56today, cardiology prefers to allow INR to drift down naturally and begin heparin when INR<2.5 CAD DM HTN HLD PAD  RUQ abdominal pain, cholelithiasis Acute cholecystitis -u/s 5/17 showed gallstones with gallbladder wall thickening and slight pericholecystic fluid. The appearance of the gallbladder is felt to be indicative of acute cholecystitis - LFTs WNL (5/16) - WBC4.8 5/19, afebrile - cleared by cardiology for surgery with moderate risk Diarrhea- C. Diff negative and GI panel negative on 5/17, added probiotic 5/20  ID -rocephin 5/16>> FEN -IVF, FLD VTE -SCDs Foley -none Follow up -TBD  Plan- Plan for lap chole once INR normalizes, ?potentially Friday. Continue FLD and IV antibiotics for now.    LOS: 6  days    Brigid Re , [facility] Surgery 02/27/2018, 10:13 AM Pager: [phone] Consults: [phone] Mon-Fri 7:00 am-4:30 pm Sat-Sun 7:00 am-11:30 am

## 2018-02-27 NOTE — Progress Notes (Signed)
ANTICOAGULATION CONSULT NOTE - Follow Up Consult  Pharmacy Consult for Heparin Indication: atrial fibrillation and mechanical valve  Allergies  Allergen Reactions  . Flagyl [Metronidazole] Rash    ALL-OVER BODY RASH  . Coreg [Carvedilol] Other (See Comments)    Terrible cramping in the feet and had a lot of bowel movements, but not diarrhea  . Losartan Swelling    Patient doesn't recall site of swelling  . Spironolactone Nausea Only    Caused extreme nausea  . Verapamil Hives  . Zetia [Ezetimibe] Other (See Comments)    Reaction not recalled  . Zocor [Simvastatin - High Dose] Other (See Comments)    Reaction not recalled   Patient Measurements: Height: 5\' 1"  (154.9 cm) Weight: 141 lb 11.2 oz (64.3 kg) IBW/kg (Calculated) : 47.8  Vital Signs: Temp: 98.4 F (36.9 C) (05/22 0452) Temp Source: Oral (05/22 0452) BP: 157/58 (05/22 0846) Pulse Rate: 98 (05/22 0846)  Labs: Recent Labs    02/25/18 0514 02/26/18 0456 02/26/18 2123 02/27/18 0445  HGB  --  10.8*  --   --   HCT  --  35.2*  --   --   PLT  --  270  --   --   LABPROT 35.2* 29.8* 28.7* 27.3*  INR 3.55 2.86 2.73 2.56  CREATININE 1.00 0.94  --  0.82   Estimated Creatinine Clearance: 47.8 mL/min (by C-G formula based on SCr of 0.82 mg/dL).  Medications:  Last dose of Coumadin given 5/16  Assessment: 77 year old female admitted with cholecystitis who may need cholecystectomy. She is on chronic anticoagulation with Coumadin for a mechanical heart valve and atrial fibrillation. Her Coumadin has been on hold since 5/16 for possible intervention. The INR is trending down, and this morning is at the low end of the therapeutic range.  Will start heparin infusion this evening as a bridge while INR is subtherapeutic (<2.5). NO BOLUS will be ordered at this time.   Goal of Therapy:  Heparin level 0.3-0.7 units/ml Monitor platelets by anticoagulation protocol: Yes   Plan:  Start heparin gtt at 850 units/hr tonight at  1400 Heparin level 8 hours after start Daily heparin level/CBC Monitor for s/sx of bleeding  Georga Bora, PharmD Clinical Pharmacist 02/27/2018 10:05 AM

## 2018-02-27 NOTE — Plan of Care (Signed)
  Problem: Clinical Measurements: Goal: Will remain free from infection Outcome: Progressing   Problem: Nutrition: Goal: Adequate nutrition will be maintained Outcome: Progressing   Problem: Clinical Measurements: Goal: Respiratory complications will improve Outcome: Completed/Met

## 2018-02-27 NOTE — Consult Note (Signed)
   [facility] CM Inpatient Consult   02/27/2018  [name] [birth year] [id number]  Patient screened for with a unplanned rehospitalization in the past 30 days in [place] [facility] services. Patient is in the [place] of the [place] [facility] services under patient's [facility] plan. Spoke with the patient at the bedside.  Patient was just starting her lunch.  Asked if writer could come at a more convenient time.  Will follow up for needs.  Chart review reveals patient is awaiting a possible lap chole pending her INR labs.  For questions contact:   Natividad Brood, RN BSN [facility] Liaison  [phone] business mobile phone Toll free office [phone]

## 2018-02-28 ENCOUNTER — Encounter: Payer: Self-pay | Admitting: *Deleted

## 2018-02-28 ENCOUNTER — Ambulatory Visit: Payer: Medicare HMO | Admitting: Podiatry

## 2018-02-28 ENCOUNTER — Inpatient Hospital Stay (HOSPITAL_COMMUNITY): Payer: Medicare HMO

## 2018-02-28 LAB — CBC
HCT: 32 % — ABNORMAL LOW (ref 36.0–46.0)
HEMOGLOBIN: 9.9 g/dL — AB (ref 12.0–15.0)
MCH: 22.9 pg — AB (ref 26.0–34.0)
MCHC: 30.9 g/dL (ref 30.0–36.0)
MCV: 74.1 fL — ABNORMAL LOW (ref 78.0–100.0)
PLATELETS: 166 10*3/uL (ref 150–400)
RBC: 4.32 MIL/uL (ref 3.87–5.11)
RDW: 18.6 % — ABNORMAL HIGH (ref 11.5–15.5)
WBC: 4.1 10*3/uL (ref 4.0–10.5)

## 2018-02-28 LAB — HEPARIN LEVEL (UNFRACTIONATED)
HEPARIN UNFRACTIONATED: 0.15 [IU]/mL — AB (ref 0.30–0.70)
HEPARIN UNFRACTIONATED: 0.22 [IU]/mL — AB (ref 0.30–0.70)

## 2018-02-28 LAB — PROTIME-INR
INR: 1.71
PROTHROMBIN TIME: 19.9 s — AB (ref 11.4–15.2)

## 2018-02-28 LAB — GLUCOSE, CAPILLARY
GLUCOSE-CAPILLARY: 108 mg/dL — AB (ref 65–99)
Glucose-Capillary: 121 mg/dL — ABNORMAL HIGH (ref 65–99)
Glucose-Capillary: 123 mg/dL — ABNORMAL HIGH (ref 65–99)
Glucose-Capillary: 127 mg/dL — ABNORMAL HIGH (ref 65–99)

## 2018-02-28 MED ORDER — HYDRALAZINE HCL 20 MG/ML IJ SOLN: 10.0000 mg | INTRAMUSCULAR | Status: DC | PRN

## 2018-02-28 MED ORDER — PROMETHAZINE HCL 25 MG/ML IJ SOLN
12.5000 mg | Freq: Once | INTRAMUSCULAR | Status: AC
  Administered 2018-02-28: 12.5 mg via INTRAVENOUS
  Filled 2018-02-28: qty 1

## 2018-02-28 NOTE — Progress Notes (Signed)
ANTICOAGULATION CONSULT NOTE - Follow Up Consult  Pharmacy Consult for Heparin Indication: atrial fibrillation and mechanical valve  Allergies  Allergen Reactions  . Flagyl [Metronidazole] Rash    ALL-OVER BODY RASH  . Coreg [Carvedilol] Other (See Comments)    Terrible cramping in the feet and had a lot of bowel movements, but not diarrhea  . Losartan Swelling    Patient doesn't recall site of swelling  . Spironolactone Nausea Only    Caused extreme nausea  . Verapamil Hives  . Zetia [Ezetimibe] Other (See Comments)    Reaction not recalled  . Zocor [Simvastatin - High Dose] Other (See Comments)    Reaction not recalled   Patient Measurements: Height: 5\' 1"  (154.9 cm) Weight: 143 lb 1.6 oz (64.9 kg) IBW/kg (Calculated) : 47.8  Vital Signs: Temp: 98.1 F (36.7 C) (05/23 0415) Temp Source: Oral (05/23 0415) BP: 128/74 (05/23 0415) Pulse Rate: 77 (05/23 0415)  Labs: Recent Labs    02/26/18 0456  02/27/18 0445 02/27/18 2102 02/28/18 0716  HGB 10.8*  --   --   --  9.9*  HCT 35.2*  --   --   --  32.0*  PLT 270  --   --   --  166  LABPROT 29.8*   < > 27.3* 23.6* 19.9*  INR 2.86   < > 2.56 2.12 1.71  HEPARINUNFRC  --   --   --  <0.10* 0.15*  CREATININE 0.94  --  0.82  --   --    < > = values in this interval not displayed.   Estimated Creatinine Clearance: 48 mL/min (by C-G formula based on SCr of 0.82 mg/dL).  Assessment: 77 y.o. female with h/o Afib/AVR, Coumadin on hold, heparin infusing at 1100 units/hr.  Hemoglobin stable and no noted bleeding.  Goal of Therapy:  Heparin level 0.3-0.7 units/ml Monitor platelets by anticoagulation protocol: Yes   Plan:  Increase Heparin  1300 units/hr Check heparin level in 8 hours.  Rober Minion, PharmD., MS Clinical Pharmacist Pager:  [phone] Thank you for allowing pharmacy to be part of this patients care team. 02/28/2018 8:34 AM

## 2018-02-28 NOTE — Significant Event (Signed)
Rapid Response Event Note  Overview: Respiratory  Initial Focused Assessment: RN asked that patient be assessed for shortness of breath and increased work of breathing. Upon arrival, patient was resting, quickly arouse, patient did endorse shortness of breath, mild increased work of breathing, lung sounds diminished in upper and lower fields.  Sats were 89% on RA, patient was placed on 2L [place] and sats improved 95%.  HR and SBP were normal. +4 pitting edema in BLE, + 1 edema in BUE.  Interventions: -- CXR -- TRH NP updated -- Lasix 40mg  IV  Plan of Care: -- Monitor respiratory status and urine output.   Event Summary:    at    [facility] Time 2325 End Time 0005  Janeice Stegall R

## 2018-02-28 NOTE — Care Management Note (Signed)
Case Management Note  Patient Details  Name: [name] MRN: [id number] Date of Birth: [birth year]  Subjective/Objective: Pt presented for RUQ Pain + for gallstones. INR initially elevated and now 1.71. Plan for lap chole 03-01-18. PTA from home alone and has support of children. Pt states she is active with [facility].                  Action/Plan: CM did call [facility] and did confirm services. Pt will need HH PT resumption orders and F2F once she is ready to transition home. CM will follow for additional needs.    Expected Discharge Date:                  Expected Discharge Plan:  [place]  In-House Referral:  NA  Discharge planning Services  CM Consult  Post Acute Care Choice:  Home Health, Resumption of Svcs/PTA Provider Choice offered to:  Patient  DME Arranged:  N/A DME Agency:  NA  HH Arranged:  PT HH Agency:  [facility]  Status of Service:  In process, will continue to follow  If discussed at Long Length of Stay Meetings, dates discussed:    Additional Comments:  Bethena Roys, RN 02/28/2018, 4:00 PM

## 2018-02-28 NOTE — Progress Notes (Signed)
[facility] Progress Note     Subjective: CC: feels tired this AM Patient was nauseated overnight, nausea medicine made her really sleepy. Feels better this AM. No diarrhea this AM so far. Abdominal pain controlled.   Objective: Vital signs in last 24 hours: Temp:  [98.1 F (36.7 C)-98.6 F (37 C)] 98.1 F (36.7 C) (05/23 0415) Pulse Rate:  [61-83] 83 (05/23 0840) Resp:  [8-20] 20 (05/23 0415) BP: (128-189)/(55-74) 168/71 (05/23 0840) SpO2:  [89 %-95 %] 89 % (05/23 0415) Weight:  [64.9 kg (143 lb 1.6 oz)] 64.9 kg (143 lb 1.6 oz) (05/23 0415) Last BM Date: 02/27/18  Intake/Output from previous day: 05/22 0701 - 05/23 0700 In: 1237 [P.O.:840; I.V.:97; IV Piggyback:300] Out: -  Intake/Output this shift: Total I/O In: 240 [P.O.:240] Out: -   PE: Gen: Alert, NAD, pleasant Card:Irregularly irregular, pedal pulses1+ BL,feet very edematous bilaterally Pulm: Normal effort, clear to auscultation bilaterally Abd: Soft,minimallyTTP in RUQ, non-distended, bowel sounds present in all 4 quadrants, no HSM Skin: warm and dry, no rashes  Psych: A&Ox3    Lab Results:  Recent Labs    02/26/18 0456 02/28/18 0716  WBC 4.8 4.1  HGB 10.8* 9.9*  HCT 35.2* 32.0*  PLT 270 166   BMET Recent Labs    02/26/18 0456 02/27/18 0445  NA 131* 132*  K 4.0 3.6  CL 100* 101  CO2 22 21*  GLUCOSE 123* 119*  BUN 10 8  CREATININE 0.94 0.82  CALCIUM 8.5* 8.3*   PT/INR Recent Labs    02/27/18 2102 02/28/18 0716  LABPROT 23.6* 19.9*  INR 2.12 1.71   CMP     Component Value Date/Time   NA 132 (L) 02/27/2018 0445   NA 137 12/06/2017 1436   K 3.6 02/27/2018 0445   CL 101 02/27/2018 0445   CO2 21 (L) 02/27/2018 0445   GLUCOSE 119 (H) 02/27/2018 0445   BUN 8 02/27/2018 0445   BUN 20 12/06/2017 1436   CREATININE 0.82 02/27/2018 0445   CALCIUM 8.3 (L) 02/27/2018 0445   PROT 6.7 02/26/2018 0456   ALBUMIN 2.4 (L) 02/26/2018 0456   AST 36 02/26/2018 0456   ALT  16 02/26/2018 0456   ALKPHOS 98 02/26/2018 0456   BILITOT 0.9 02/26/2018 0456   GFRNONAA >60 02/27/2018 0445   GFRAA >60 02/27/2018 0445   Lipase     Component Value Date/Time   LIPASE 47 02/21/2018 1519       Studies/Results: No results found.  Anti-infectives: Anti-infectives (From admission, onward)   Start     Dose/Rate Route Frequency Ordered Stop   02/25/18 1300  Ampicillin-Sulbactam (UNASYN) 3 g in sodium chloride 0.9 % 100 mL IVPB     3 g 200 mL/hr over 30 Minutes Intravenous Every 8 hours 02/25/18 1245     02/22/18 2200  cefTRIAXone (ROCEPHIN) 2 g in sodium chloride 0.9 % 100 mL IVPB  Status:  Discontinued     2 g 200 mL/hr over 30 Minutes Intravenous Every 24 hours 02/21/18 2312 02/25/18 1222   02/21/18 2330  cefTRIAXone (ROCEPHIN) 1 g in sodium chloride 0.9 % 100 mL IVPB     1 g 200 mL/hr over 30 Minutes Intravenous  Once 02/21/18 2317 02/22/18 0344   02/21/18 2215  cefTRIAXone (ROCEPHIN) 1 g in sodium chloride 0.9 % 100 mL IVPB     1 g 200 mL/hr over 30 Minutes Intravenous  Once 02/21/18 2210 02/21/18 2334       Assessment/Plan Afib  AVRin 2005- INR1.71today, stop heparin tomorrow AM for OR and recheck INR in AM CAD DM HTN HLD PAD  RUQ abdominal pain, cholelithiasis Acute cholecystitis -u/s 5/17 showed gallstones with gallbladder wall thickening and slight pericholecystic fluid. The appearance of the gallbladder is felt to be indicative of acute cholecystitis - LFTs WNL (5/16) - WBC4.85/19, afebrile - cleared by cardiology for surgery with moderate risk Diarrhea- C. Diff negative and GI panel negative on 5/17,addedprobiotic 5/20  ID -rocephin 5/16>> FEN -IVF, FLD VTE -SCDs Foley -none Follow up -TBD  Plan- Plan for lap chole tomorrow, NPO after MN, check INR in AM, hold heparin after 0600 tomorrow AM    LOS: 7 days    Brigid Re , [facility] Surgery 02/28/2018, 10:26 AM Pager: [phone] Consults:  [phone] Mon-Fri 7:00 am-4:30 pm Sat-Sun 7:00 am-11:30 am

## 2018-02-28 NOTE — Progress Notes (Signed)
PROGRESS NOTE    [name]  IPJ:[id number] DOB: August 26, 1941 DOA: 02/21/2018 PCP: [name], MD   Principal Problem:   Cholecystitis Active Problems:   Hypertension   Hypercholesteremia   Mechanical heart valve present   Chronic anticoagulation   Coronary artery disease   Diabetes mellitus without complication (HCC)   Atrial fibrillation (HCC)   Cholelithiasis   GERD (gastroesophageal reflux disease)   Anxiety   Chronic diastolic CHF (congestive heart failure) (HCC)   Diarrhea   Rash   UTI (urinary tract infection)   Brief Narrative: Patient is a 77 -year-old female with past medical history significant for hypertension, hyperlipidemia, diabetes mellitus, GERD, atrial fibrillation, atrial valve replacement with mechanical valve on Coumadin, peripheral vascular disease, carotid artery stenosis, diastolic CHF, CAD, GI bleed who presents to the emergency department with complaints of abdominal pain, nausea and vomiting.   She was reporting to have abdominal pain on and off for last 3 months with intermittent nausea, vomiting.  Patient was also reported to have diarrhea. CT abdomen/pelvis done here showed cholelithiasis with pericholecystatic fluid.  Ultrasound done here was positive  for cholecystitis cholecystitis.  She has been started on antibiotics.  General surgery consulted for possible cholecystectomy once INR normalizes.     Assessment & Plan:   1)Cholecystitis/cholelithiasis:   abd pain is controlled, , tolerating liquid diet, plan for lap cholecystectomy on 03/01/18, will hold IV heparin at 6 AM in the morning to allow for surgery, c/n Unasyn for better anaerobic coverage.  Continue pain management, Zofran for nausea. cleared by cardiology for surgery with moderate risk  2)Mechanical Aortic valve: Status post aortic valve replacement.  Was on Coumadin at home.  INR was supratherapeutic , she received vitamin K, continue  heparin drip , however will hold heparin in  order to allow for lap chole on 03/01/2018 , mechanical valve prosthesis functioning normally on echo  3)Diarrhea: C. difficile suspected. Diarrhea has stopped. Negative  PCR for C. difficile and GI pathogen panel.  4)Anemia: Hemoglobin the range of 10 . FOBT negative.  Anemia  Panel shows low iron.  SS/P  2 units of PRBC .  Continue iron supplementation   5)Hypertension:  BP is not at goal ,  may use IV Hydralazine 10 mg  Every 4 hours Prn for systolic blood pressure over 160 mmhg, continue irbesartan and metoprolol.  6)H/o CAD-asymptomatic at this time, continue metoprolol, Lipitor and aspirin  7)Diabetes mellitus: Last hemoglobin A1c was 6.4 on 09/19/17.  Patient was on metformin and glipizide at home.  Allow some permissive Hyperglycemia rather than risk life-threatening hypoglycemia in a patient with unreliable oral intake. Use Novolog/Humalog Sliding scale insulin with Accu-Cheks/Fingersticks as ordered  8)History of atrial fibrillation: CHA2DS2-VASc Score is 7. Patient is on Coumadin at home.  Currently on IV heparin as above #2, continue metoprolol for rate control  9)HFpEF/Chronic diastolic CHF/underlying pulmonary hypertension last 2D echo showed ejection fraction of 60 to 65%.  Repeat echo echocardiogram done here showed ejection fraction of 55 to 73%, grade 3 diastolic dysfunction, functioning mechanical aortic valve. Worsening pulmonary hypertension, continue Lasix,        DVT prophylaxis: IV heparin Code Status: Full code Family Communication: None present at the bedside Disposition Plan:  Awaiting for cholecystectomy on 03/01/18   Consultants: Cardiology, general surgery  Procedures: None  Antimicrobials: Ceftriaxone since 02/21/18, switched to IV Unasyn  Subjective:  Abdominal pain is controlled, no BM, no fevers no vomiting had nausea   Objective: Vitals:  02/28/18 0415 02/28/18 0840 02/28/18 1516 02/28/18 1517  BP: 128/74 (!) 168/71 (!) 162/71 (!) 162/71    Pulse: 77 83  87  Resp: 20     Temp: 98.1 F (36.7 C)   99.3 F (37.4 C)  TempSrc: Oral   Oral  SpO2: (!) 89%   92%  Weight: 64.9 kg (143 lb 1.6 oz)     Height:        Intake/Output Summary (Last 24 hours) at 02/28/2018 1706 Last data filed at 02/28/2018 1600 Gross per 24 hour  Intake 1271.52 ml  Output -  Net 1271.52 ml   Filed Weights   02/26/18 0500 02/27/18 0500 02/28/18 0415  Weight: 65 kg (143 lb 3.2 oz) 64.3 kg (141 lb 11.2 oz) 64.9 kg (143 lb 1.6 oz)    Examination:   Physical Exam  Gen:- Awake Alert,  In no apparent distress  HEENT:- [place].AT, No sclera icterus Neck-Supple Neck,No JVD,.  Lungs-  CTAB , with good air movement CV- S1, S2 normal, click of mechanical valve Abd-  +ve B.Sounds, Abd Soft, right upper quadrant tenderness without rebound or guarding    Extremity/Skin:- No  edema,   +ve pulses Psych-affect is appropriate, oriented x3 Neuro-no new focal deficits, no tremors   Data Reviewed: I have personally reviewed following labs and imaging studies  CBC: Recent Labs  Lab 02/22/18 0259 02/23/18 0711 02/24/18 0450 02/26/18 0456 02/28/18 0716  WBC 3.0* 3.9* 4.0 4.8 4.1  NEUTROABS  --  2.9 3.1  --   --   HGB 7.1* 9.8* 10.0* 10.8* 9.9*  HCT 23.3* 31.9* 32.7* 35.2* 32.0*  MCV 72.8* 73.7* 73.8* 73.9* 74.1*  PLT 250 267 257 270 803   Basic Metabolic Panel: Recent Labs  Lab 02/22/18 0259 02/24/18 0450 02/25/18 0514 02/26/18 0456 02/27/18 0445  NA 132* 130* 130* 131* 132*  K 4.0 4.0 4.1 4.0 3.6  CL 103 99* 98* 100* 101  CO2 20* 22 21* 22 21*  GLUCOSE 70 90 106* 123* 119*  BUN 15 15 13 10 8   CREATININE 0.97 1.04* 1.00 0.94 0.82  CALCIUM 8.2* 8.3* 8.3* 8.5* 8.3*   GFR: Estimated Creatinine Clearance: 48 mL/min (by C-G formula based on SCr of 0.82 mg/dL). Liver Function Tests: Recent Labs  Lab 02/26/18 0456  AST 36  ALT 16  ALKPHOS 98  BILITOT 0.9  PROT 6.7  ALBUMIN 2.4*   No results for input(s): LIPASE, AMYLASE in the last 168  hours. No results for input(s): AMMONIA in the last 168 hours. Coagulation Profile: Recent Labs  Lab 02/26/18 0456 02/26/18 2123 02/27/18 0445 02/27/18 2102 02/28/18 0716  INR 2.86 2.73 2.56 2.12 1.71   Cardiac Enzymes: No results for input(s): CKTOTAL, CKMB, CKMBINDEX, TROPONINI in the last 168 hours. BNP (last 3 results) No results for input(s): PROBNP in the last 8760 hours. HbA1C: No results for input(s): HGBA1C in the last 72 hours. CBG: Recent Labs  Lab 02/27/18 1717 02/27/18 2030 02/28/18 0733 02/28/18 1136 02/28/18 1628  GLUCAP 147* 111* 108* 121* 123*   Lipid Profile: No results for input(s): CHOL, HDL, LDLCALC, TRIG, CHOLHDL, LDLDIRECT in the last 72 hours. Thyroid Function Tests: No results for input(s): TSH, T4TOTAL, FREET4, T3FREE, THYROIDAB in the last 72 hours. Anemia Panel: No results for input(s): VITAMINB12, FOLATE, FERRITIN, TIBC, IRON, RETICCTPCT in the last 72 hours. Sepsis Labs: Recent Labs  Lab 02/21/18 2354 02/22/18 0259  PROCALCITON 0.19  --   LATICACIDVEN 1.0 1.0    Recent  Results (from the past 240 hour(s))  Culture, blood (Routine X 2) w Reflex to ID Panel     Status: None   Collection Time: 02/22/18 12:00 AM  Result Value Ref Range Status   Specimen Description BLOOD SITE NOT SPECIFIED  Final   Special Requests   Final    BOTTLES DRAWN AEROBIC AND ANAEROBIC Blood Culture adequate volume   Culture   Final    NO GROWTH 5 DAYS Performed at [facility], [address] [place], [place] [address]    Report Status 02/27/2018 FINAL  Final  Culture, blood (Routine X 2) w Reflex to ID Panel     Status: None   Collection Time: 02/22/18  2:54 AM  Result Value Ref Range Status   Specimen Description BLOOD LEFT HAND  Final   Special Requests   Final    BOTTLES DRAWN AEROBIC ONLY Blood Culture adequate volume   Culture   Final    NO GROWTH 5 DAYS Performed at [facility], [place] [address] [place], [place] [address]    Report  Status 02/27/2018 FINAL  Final  C difficile quick scan w PCR reflex     Status: None   Collection Time: 02/22/18 12:09 PM  Result Value Ref Range Status   C Diff antigen NEGATIVE NEGATIVE Final   C Diff toxin NEGATIVE NEGATIVE Final   C Diff interpretation No C. difficile detected.  Final    Comment: Performed at [facility], [place] [address] [place], [place] [address]  Gastrointestinal Panel by PCR , Stool     Status: None   Collection Time: 02/22/18 12:09 PM  Result Value Ref Range Status   Campylobacter species NOT DETECTED NOT DETECTED Final   Plesimonas shigelloides NOT DETECTED NOT DETECTED Final   Salmonella species NOT DETECTED NOT DETECTED Final   Yersinia enterocolitica NOT DETECTED NOT DETECTED Final   Vibrio species NOT DETECTED NOT DETECTED Final   Vibrio cholerae NOT DETECTED NOT DETECTED Final   Enteroaggregative E coli (EAEC) NOT DETECTED NOT DETECTED Final   Enteropathogenic E coli (EPEC) NOT DETECTED NOT DETECTED Final   Enterotoxigenic E coli (ETEC) NOT DETECTED NOT DETECTED Final   Shiga like toxin producing E coli (STEC) NOT DETECTED NOT DETECTED Final   Shigella/Enteroinvasive E coli (EIEC) NOT DETECTED NOT DETECTED Final   Cryptosporidium NOT DETECTED NOT DETECTED Final   Cyclospora cayetanensis NOT DETECTED NOT DETECTED Final   Entamoeba histolytica NOT DETECTED NOT DETECTED Final   Giardia lamblia NOT DETECTED NOT DETECTED Final   Adenovirus F40/41 NOT DETECTED NOT DETECTED Final   Astrovirus NOT DETECTED NOT DETECTED Final   Norovirus GI/GII NOT DETECTED NOT DETECTED Final   Rotavirus A NOT DETECTED NOT DETECTED Final   Sapovirus (I, II, IV, and V) NOT DETECTED NOT DETECTED Final    Comment: Performed at [facility], [address] [place], [place] [address]         Radiology Studies: No results found.      Scheduled Meds: . ALPRAZolam  0.25-0.5 mg Oral QHS  . aspirin EC  81 mg Oral Q M,W,F  . atorvastatin  80 mg Oral Q  supper  . cholecalciferol  1,000 Units Oral Daily  . ferrous sulfate  325 mg Oral BID WC  . furosemide  60 mg Oral Daily  . hydrALAZINE  100 mg Oral TID  . hydrocortisone cream   Topical BID  . insulin aspart  0-9 Units Subcutaneous TID  WC  . irbesartan  300 mg Oral Daily  . magnesium oxide  400 mg Oral Daily  . metoprolol tartrate  50 mg Oral BID  . pantoprazole  40 mg Oral Daily  . saccharomyces boulardii  250 mg Oral BID  . vitamin B-12  100 mcg Oral Daily   Continuous Infusions: . ampicillin-sulbactam (UNASYN) IV Stopped (02/28/18 1333)  . heparin 1,300 Units/hr (02/28/18 1516)     LOS: 7 days    Time spent: 25 mins.More than 50% of that time was spent in counseling and/or coordination of care.      Roxan Hockey, MD [facility]    If 7PM-7AM, please contact night-coverage [email or web address] Password [facility] 02/28/2018, 5:06 PM

## 2018-02-28 NOTE — Progress Notes (Signed)
ANTICOAGULATION CONSULT NOTE - Follow Up Consult  Pharmacy Consult for Heparin Indication: atrial fibrillation and mechanical valve  Allergies  Allergen Reactions  . Flagyl [Metronidazole] Rash    ALL-OVER BODY RASH  . Coreg [Carvedilol] Other (See Comments)    Terrible cramping in the feet and had a lot of bowel movements, but not diarrhea  . Losartan Swelling    Patient doesn't recall site of swelling  . Spironolactone Nausea Only    Caused extreme nausea  . Verapamil Hives  . Zetia [Ezetimibe] Other (See Comments)    Reaction not recalled  . Zocor [Simvastatin - High Dose] Other (See Comments)    Reaction not recalled   Patient Measurements: Height: 5\' 1"  (154.9 cm) Weight: 143 lb 1.6 oz (64.9 kg) IBW/kg (Calculated) : 47.8  Vital Signs: Temp: 99.3 F (37.4 C) (05/23 1517) Temp Source: Oral (05/23 1517) BP: 162/71 (05/23 1517) Pulse Rate: 87 (05/23 1517)  Labs: Recent Labs    02/26/18 0456  02/27/18 0445 02/27/18 2102 02/28/18 0716 02/28/18 1649  HGB 10.8*  --   --   --  9.9*  --   HCT 35.2*  --   --   --  32.0*  --   PLT 270  --   --   --  166  --   LABPROT 29.8*   < > 27.3* 23.6* 19.9*  --   INR 2.86   < > 2.56 2.12 1.71  --   HEPARINUNFRC  --   --   --  <0.10* 0.15* 0.22*  CREATININE 0.94  --  0.82  --   --   --    < > = values in this interval not displayed.   Estimated Creatinine Clearance: 48 mL/min (by C-G formula based on SCr of 0.82 mg/dL).  Assessment: 77 y.o. female with h/o Afib/AVR, Coumadin on hold, heparin infusing at 1300 units/hr.  Hemoglobin stable and no noted bleeding.  Per RN, no issues with IV infusion.  Goal of Therapy:  Heparin level 0.3-0.7 units/ml Monitor platelets by anticoagulation protocol: Yes   Plan:  Increase Heparin 1450 units/hr Check heparin level in 8 hours. Heparin gtt scheduled off at 0600 AM tomorrow for lap chole.  Marguerite Olea, [facility] Clinical Pharmacist Pager [phone]  02/28/2018 6:40  PM

## 2018-02-28 NOTE — Progress Notes (Signed)
ANTICOAGULATION CONSULT NOTE - Follow Up Consult  Pharmacy Consult for Heparin Indication: atrial fibrillation and mechanical valve  Allergies  Allergen Reactions  . Flagyl [Metronidazole] Rash    ALL-OVER BODY RASH  . Coreg [Carvedilol] Other (See Comments)    Terrible cramping in the feet and had a lot of bowel movements, but not diarrhea  . Losartan Swelling    Patient doesn't recall site of swelling  . Spironolactone Nausea Only    Caused extreme nausea  . Verapamil Hives  . Zetia [Ezetimibe] Other (See Comments)    Reaction not recalled  . Zocor [Simvastatin - High Dose] Other (See Comments)    Reaction not recalled   Patient Measurements: Height: 5\' 1"  (154.9 cm) Weight: 141 lb 11.2 oz (64.3 kg) IBW/kg (Calculated) : 47.8  Vital Signs: Temp: 98.6 F (37 C) (05/22 2026) Temp Source: Oral (05/22 2026) BP: 153/70 (05/22 2026) Pulse Rate: 82 (05/22 2026)  Labs: Recent Labs    02/25/18 0514 02/26/18 0456 02/26/18 2123 02/27/18 0445 02/27/18 2102  HGB  --  10.8*  --   --   --   HCT  --  35.2*  --   --   --   PLT  --  270  --   --   --   LABPROT 35.2* 29.8* 28.7* 27.3* 23.6*  INR 3.55 2.86 2.73 2.56 2.12  HEPARINUNFRC  --   --   --   --  <0.10*  CREATININE 1.00 0.94  --  0.82  --    Estimated Creatinine Clearance: 47.8 mL/min (by C-G formula based on SCr of 0.82 mg/dL).  Assessment: 76 y.o. female with h/o Afib/AVR, Coumadin on hold, for heparin Goal of Therapy:  Heparin level 0.3-0.7 units/ml Monitor platelets by anticoagulation protocol: Yes   Plan:  Increase Heparin  1100 units/hr Check heparin level in 8 hours.  [name] Knack, PharmD, BCPS  02/28/2018 12:33 AM

## 2018-03-01 ENCOUNTER — Encounter (HOSPITAL_COMMUNITY)
Admission: EM | Disposition: A | Discharge status: Home Health | Payer: Self-pay | Source: Home / Self Care | Attending: Family Medicine

## 2018-03-01 ENCOUNTER — Encounter (HOSPITAL_COMMUNITY): Payer: Self-pay

## 2018-03-01 ENCOUNTER — Inpatient Hospital Stay (HOSPITAL_COMMUNITY): Payer: Medicare HMO | Admitting: Certified Registered"

## 2018-03-01 HISTORY — PX: CHOLECYSTECTOMY: SHX55

## 2018-03-01 LAB — COMPREHENSIVE METABOLIC PANEL
ALT: 12 U/L — AB (ref 14–54)
AST: 28 U/L (ref 15–41)
Albumin: 2.2 g/dL — ABNORMAL LOW (ref 3.5–5.0)
Alkaline Phosphatase: 75 U/L (ref 38–126)
Anion gap: 8 (ref 5–15)
BILIRUBIN TOTAL: 0.9 mg/dL (ref 0.3–1.2)
BUN: 8 mg/dL (ref 6–20)
CHLORIDE: 102 mmol/L (ref 101–111)
CO2: 24 mmol/L (ref 22–32)
CREATININE: 0.87 mg/dL (ref 0.44–1.00)
Calcium: 7.9 mg/dL — ABNORMAL LOW (ref 8.9–10.3)
Glucose, Bld: 125 mg/dL — ABNORMAL HIGH (ref 65–99)
POTASSIUM: 3.1 mmol/L — AB (ref 3.5–5.1)
Sodium: 134 mmol/L — ABNORMAL LOW (ref 135–145)
TOTAL PROTEIN: 6 g/dL — AB (ref 6.5–8.1)

## 2018-03-01 LAB — CBC
HCT: 31.3 % — ABNORMAL LOW (ref 36.0–46.0)
HCT: 34.5 % — ABNORMAL LOW (ref 36.0–46.0)
Hemoglobin: 9.8 g/dL — ABNORMAL LOW (ref 12.0–15.0)
Hemoglobin: 10.5 g/dL — ABNORMAL LOW (ref 12.0–15.0)
MCH: 23.2 pg — ABNORMAL LOW (ref 26.0–34.0)
MCH: 22.7 pg — AB (ref 26.0–34.0)
MCHC: 31.3 g/dL (ref 30.0–36.0)
MCHC: 30.4 g/dL (ref 30.0–36.0)
MCV: 74.2 fL — ABNORMAL LOW (ref 78.0–100.0)
MCV: 74.7 fL — AB (ref 78.0–100.0)
PLATELETS: 227 10*3/uL (ref 150–400)
PLATELETS: 204 10*3/uL (ref 150–400)
RBC: 4.22 MIL/uL (ref 3.87–5.11)
RBC: 4.62 MIL/uL (ref 3.87–5.11)
RDW: 18.6 % — ABNORMAL HIGH (ref 11.5–15.5)
RDW: 19 % — AB (ref 11.5–15.5)
WBC: 5.8 10*3/uL (ref 4.0–10.5)
WBC: 6.1 10*3/uL (ref 4.0–10.5)

## 2018-03-01 LAB — HEPARIN LEVEL (UNFRACTIONATED)
HEPARIN UNFRACTIONATED: 0.34 [IU]/mL (ref 0.30–0.70)
Heparin Unfractionated: 0.33 IU/mL (ref 0.30–0.70)

## 2018-03-01 LAB — GLUCOSE, CAPILLARY
GLUCOSE-CAPILLARY: 91 mg/dL (ref 65–99)
Glucose-Capillary: 116 mg/dL — ABNORMAL HIGH (ref 65–99)
Glucose-Capillary: 129 mg/dL — ABNORMAL HIGH (ref 65–99)
Glucose-Capillary: 116 mg/dL — ABNORMAL HIGH (ref 65–99)
Glucose-Capillary: 119 mg/dL — ABNORMAL HIGH (ref 65–99)

## 2018-03-01 LAB — SURGICAL PCR SCREEN
MRSA, PCR: NEGATIVE
Staphylococcus aureus: NEGATIVE

## 2018-03-01 LAB — PROTIME-INR
INR: 1.59
PROTHROMBIN TIME: 18.9 s — AB (ref 11.4–15.2)

## 2018-03-01 SURGERY — LAPAROSCOPIC CHOLECYSTECTOMY WITH INTRAOPERATIVE CHOLANGIOGRAM
Anesthesia: General | Site: Abdomen

## 2018-03-01 MED ORDER — FUROSEMIDE 10 MG/ML IJ SOLN
40.0000 mg | Freq: Once | INTRAMUSCULAR | Status: AC
  Administered 2018-03-01: 40 mg via INTRAVENOUS
  Filled 2018-03-01: qty 4

## 2018-03-01 MED ORDER — IOPAMIDOL (ISOVUE-300) INJECTION 61%
INTRAVENOUS | Status: AC
  Filled 2018-03-01: qty 50

## 2018-03-01 MED ORDER — METOCLOPRAMIDE HCL 5 MG/ML IJ SOLN: 10.0000 mg | Freq: Once | INTRAMUSCULAR | Status: DC | PRN

## 2018-03-01 MED ORDER — POTASSIUM CHLORIDE 10 MEQ/100ML IV SOLN
10.0000 meq | INTRAVENOUS | Status: AC
  Administered 2018-03-01 (×3): 10 meq via INTRAVENOUS
  Filled 2018-03-01 (×2): qty 100

## 2018-03-01 MED ORDER — LIDOCAINE 2% (20 MG/ML) 5 ML SYRINGE
INTRAMUSCULAR | Status: DC | PRN
  Administered 2018-03-01: 80 mg via INTRAVENOUS

## 2018-03-01 MED ORDER — SODIUM CHLORIDE 0.9 % IR SOLN
Status: DC | PRN
  Administered 2018-03-01: 1000 mL

## 2018-03-01 MED ORDER — PROPOFOL 10 MG/ML IV BOLUS
INTRAVENOUS | Status: DC | PRN
  Administered 2018-03-01: 120 mg via INTRAVENOUS

## 2018-03-01 MED ORDER — SUGAMMADEX SODIUM 200 MG/2ML IV SOLN
INTRAVENOUS | Status: DC | PRN
  Administered 2018-03-01: 120 mg via INTRAVENOUS

## 2018-03-01 MED ORDER — 0.9 % SODIUM CHLORIDE (POUR BTL) OPTIME
TOPICAL | Status: DC | PRN
  Administered 2018-03-01: 1000 mL

## 2018-03-01 MED ORDER — FENTANYL CITRATE (PF) 250 MCG/5ML IJ SOLN
INTRAMUSCULAR | Status: DC | PRN
  Administered 2018-03-01: 75 ug via INTRAVENOUS
  Administered 2018-03-01: 50 ug via INTRAVENOUS

## 2018-03-01 MED ORDER — HEPARIN (PORCINE) IN NACL 100-0.45 UNIT/ML-% IJ SOLN
1450.0000 [IU]/h | INTRAMUSCULAR | Status: DC
  Administered 2018-03-01 – 2018-03-04 (×4): 1450 [IU]/h via INTRAVENOUS
  Filled 2018-03-01 (×5): qty 250

## 2018-03-01 MED ORDER — FENTANYL CITRATE (PF) 100 MCG/2ML IJ SOLN: 25.0000 ug | INTRAMUSCULAR | Status: DC | PRN

## 2018-03-01 MED ORDER — ALUM & MAG HYDROXIDE-SIMETH 200-200-20 MG/5ML PO SUSP
30.0000 mL | Freq: Once | ORAL | Status: AC
  Administered 2018-03-01: 30 mL via ORAL

## 2018-03-01 MED ORDER — TRAMADOL HCL 50 MG PO TABS
50.0000 mg | ORAL_TABLET | Freq: Four times a day (QID) | ORAL | Status: DC | PRN
  Administered 2018-03-01 – 2018-03-03 (×2): 50 mg via ORAL
  Filled 2018-03-01 (×4): qty 1

## 2018-03-01 MED ORDER — POTASSIUM CHLORIDE 10 MEQ/100ML IV SOLN
10.0000 meq | Freq: Once | INTRAVENOUS | Status: AC
  Administered 2018-03-01: 10 meq via INTRAVENOUS

## 2018-03-01 MED ORDER — SUCCINYLCHOLINE CHLORIDE 200 MG/10ML IV SOSY
PREFILLED_SYRINGE | INTRAVENOUS | Status: DC | PRN
  Administered 2018-03-01: 100 mg via INTRAVENOUS

## 2018-03-01 MED ORDER — BUPIVACAINE-EPINEPHRINE (PF) 0.25% -1:200000 IJ SOLN
INTRAMUSCULAR | Status: AC
  Filled 2018-03-01: qty 30

## 2018-03-01 MED ORDER — ROCURONIUM BROMIDE 10 MG/ML (PF) SYRINGE
PREFILLED_SYRINGE | INTRAVENOUS | Status: DC | PRN
  Administered 2018-03-01: 50 mg via INTRAVENOUS

## 2018-03-01 MED ORDER — LACTATED RINGERS IV SOLN
INTRAVENOUS | Status: DC
  Administered 2018-03-01: 09:00:00 via INTRAVENOUS

## 2018-03-01 MED ORDER — AMPICILLIN-SULBACTAM SODIUM 3 (2-1) G IJ SOLR
3.0000 g | Freq: Three times a day (TID) | INTRAMUSCULAR | Status: AC
  Administered 2018-03-01: 3 g via INTRAVENOUS
  Filled 2018-03-01: qty 3

## 2018-03-01 MED ORDER — MEPERIDINE HCL 50 MG/ML IJ SOLN: 6.2500 mg | INTRAMUSCULAR | Status: DC | PRN

## 2018-03-01 MED ORDER — ONDANSETRON HCL 4 MG/2ML IJ SOLN
INTRAMUSCULAR | Status: DC | PRN
  Administered 2018-03-01: 4 mg via INTRAVENOUS

## 2018-03-01 MED ORDER — PHENYLEPHRINE HCL 10 MG/ML IJ SOLN
INTRAVENOUS | Status: DC | PRN
  Administered 2018-03-01: 40 ug/min via INTRAVENOUS

## 2018-03-01 MED ORDER — PHENYLEPHRINE 40 MCG/ML (10ML) SYRINGE FOR IV PUSH (FOR BLOOD PRESSURE SUPPORT)
PREFILLED_SYRINGE | INTRAVENOUS | Status: DC | PRN
  Administered 2018-03-01: 80 ug via INTRAVENOUS

## 2018-03-01 MED ORDER — FENTANYL CITRATE (PF) 250 MCG/5ML IJ SOLN
INTRAMUSCULAR | Status: AC
  Filled 2018-03-01: qty 5

## 2018-03-01 MED ORDER — BUPIVACAINE-EPINEPHRINE 0.25% -1:200000 IJ SOLN
INTRAMUSCULAR | Status: DC | PRN
  Administered 2018-03-01: 30 mL

## 2018-03-01 SURGICAL SUPPLY — 50 items
ADH SKN CLS APL DERMABOND .7 (GAUZE/BANDAGES/DRESSINGS) ×1
APPLIER CLIP 5 13 M/L LIGAMAX5 (MISCELLANEOUS) ×2
APR CLP MED LRG 5 ANG JAW (MISCELLANEOUS) ×1
BAG SPEC RTRVL 10 TROC 200 (ENDOMECHANICALS) ×1
BLADE CLIPPER SURG (BLADE) IMPLANT
CANISTER SUCT 3000ML PPV (MISCELLANEOUS) ×2 IMPLANT
CHLORAPREP W/TINT 26ML (MISCELLANEOUS) ×2 IMPLANT
CLIP APPLIE 5 13 M/L LIGAMAX5 (MISCELLANEOUS) ×1 IMPLANT
COVER MAYO STAND STRL (DRAPES) IMPLANT
COVER SURGICAL LIGHT HANDLE (MISCELLANEOUS) ×2 IMPLANT
DERMABOND ADVANCED (GAUZE/BANDAGES/DRESSINGS) ×1
DERMABOND ADVANCED .7 DNX12 (GAUZE/BANDAGES/DRESSINGS) ×1 IMPLANT
DRAPE C-ARM 42X72 X-RAY (DRAPES) IMPLANT
DRSG TEGADERM 2-3/8X2-3/4 SM (GAUZE/BANDAGES/DRESSINGS) ×8 IMPLANT
ELECT REM PT RETURN 9FT ADLT (ELECTROSURGICAL) ×2
ELECTRODE REM PT RTRN 9FT ADLT (ELECTROSURGICAL) ×1 IMPLANT
GLOVE BIO SURGEON STRL SZ7 (GLOVE) ×1 IMPLANT
GLOVE BIOGEL PI IND STRL 7.0 (GLOVE) IMPLANT
GLOVE BIOGEL PI IND STRL 7.5 (GLOVE) IMPLANT
GLOVE BIOGEL PI IND STRL 8 (GLOVE) ×1 IMPLANT
GLOVE BIOGEL PI INDICATOR 7.0 (GLOVE) ×1
GLOVE BIOGEL PI INDICATOR 7.5 (GLOVE) ×3
GLOVE BIOGEL PI INDICATOR 8 (GLOVE) ×1
GLOVE ECLIPSE 7.5 STRL STRAW (GLOVE) ×4 IMPLANT
GLOVE SURG SS PI 7.5 STRL IVOR (GLOVE) ×1 IMPLANT
GOWN STRL REUS W/ TWL LRG LVL3 (GOWN DISPOSABLE) ×3 IMPLANT
GOWN STRL REUS W/ TWL XL LVL3 (GOWN DISPOSABLE) IMPLANT
GOWN STRL REUS W/TWL LRG LVL3 (GOWN DISPOSABLE) ×6
GOWN STRL REUS W/TWL XL LVL3 (GOWN DISPOSABLE) ×2
KIT BASIN OR (CUSTOM PROCEDURE TRAY) ×2 IMPLANT
KIT TURNOVER KIT B (KITS) ×2 IMPLANT
NS IRRIG 1000ML POUR BTL (IV SOLUTION) ×2 IMPLANT
PAD ARMBOARD 7.5X6 YLW CONV (MISCELLANEOUS) ×2 IMPLANT
POUCH RETRIEVAL ECOSAC 10 (ENDOMECHANICALS) ×1 IMPLANT
POUCH RETRIEVAL ECOSAC 10MM (ENDOMECHANICALS) ×1
SCISSORS LAP 5X35 DISP (ENDOMECHANICALS) ×2 IMPLANT
SET CHOLANGIOGRAPH 5 50 .035 (SET/KITS/TRAYS/PACK) IMPLANT
SET IRRIG TUBING LAPAROSCOPIC (IRRIGATION / IRRIGATOR) ×2 IMPLANT
SLEEVE ENDOPATH XCEL 5M (ENDOMECHANICALS) ×4 IMPLANT
SPECIMEN JAR SMALL (MISCELLANEOUS) ×2 IMPLANT
STRIP CLOSURE SKIN 1/2X4 (GAUZE/BANDAGES/DRESSINGS) ×2 IMPLANT
SUT MNCRL AB 4-0 PS2 18 (SUTURE) ×2 IMPLANT
SUT VICRYL 0 UR6 27IN ABS (SUTURE) ×1 IMPLANT
TOWEL OR 17X24 6PK STRL BLUE (TOWEL DISPOSABLE) ×2 IMPLANT
TOWEL OR 17X26 10 PK STRL BLUE (TOWEL DISPOSABLE) ×1 IMPLANT
TRAY LAPAROSCOPIC MC (CUSTOM PROCEDURE TRAY) ×2 IMPLANT
TROCAR XCEL BLUNT TIP 100MML (ENDOMECHANICALS) ×2 IMPLANT
TROCAR XCEL NON-BLD 5MMX100MML (ENDOMECHANICALS) ×2 IMPLANT
TUBING INSUFFLATION (TUBING) ×2 IMPLANT
WATER STERILE IRR 1000ML POUR (IV SOLUTION) ×2 IMPLANT

## 2018-03-01 NOTE — Progress Notes (Signed)
Pt very sleepy after surgery. Pt is oriented and will answer orientation questions and then will drift back to sleep. Pt did receive her Avapro, Hydralazine,Lasix,  Asprin and Protonix. Pt was able to swollow pills with no issues. Spoke with daughter will hold other daily medications until pt is more Alert and awake enough to eat. Will cont to monitor pt.

## 2018-03-01 NOTE — Op Note (Signed)
OPERATIVE REPORT  DATE OF OPERATION: 02/21/2018 - 03/01/2018  PATIENT:  [name]  77 y.o. female  PRE-OPERATIVE DIAGNOSIS:  ACUTE CHOLECYSTITIS  POST-OPERATIVE DIAGNOSIS:  ACUTE CHOLECYSTITIS AND CHOLELITHIASIS  INDICATION(S) FOR OPERATION:  Symptomatic gallstones  FINDINGS:  Mild acute inflammation.    PROCEDURE:  Procedure(s): LAPAROSCOPIC CHOLECYSTECTOMY  SURGEON:  Surgeon(s): Judeth Horn, MD  ASSISTANT: RNFA  ANESTHESIA:   general  COMPLICATIONS:  None  EBL: 5 ml  BLOOD ADMINISTERED: none  DRAINS: none   SPECIMEN:  Source of Specimen:  Gallbladder and contents  COUNTS CORRECT:  YES  PROCEDURE DETAILS: The patient was taken to the operating room and placed on the table in the supine position.  After an adequate endotracheal anesthetic was administered, the patient was prepped with CholoroPrep, and then draped in the usual manner exposing the entire abdomen laterally, inferiorly and up  to the costal margins.  After a proper timeout was performed including identifying the patient and the procedure to be performed, a supraumbilical 1.7PZ midline incision was made using a #15 blade.  This was taken down to the fascia which was then incised with a #15 blade.  The edges of the fascia were tented up with Kocher clamps as the preperitoneal space was penetrated with a Kelly clamp into the peritoneum.  Once this was done, a pursestring suture of 0 Vicryl was passed around the fascial opening.  This was subsequently used to secure the [facility] cannula which was passed into the peritoneal cavity.  Once the [facility] cannula was in place, carbon dioxide gas was insufflated into the peritoneal cavity up to a maximal intra-abdominal pressure of 80mm Hg.The laparoscope, with attached camera and light source, was passed into the peritoneal cavity to visualize the direct insertion of two right upper quadrant 44mm cannulas, and a sup-xiphoid 44mm cannula.  Once all cannulas were in place, the  dissection was begun.  Two ratcheted graspers were attached to the dome and infundibulum of the gallbladder and retracted towards the anterior abdominal wall and the right upper quadrant.  Using cautery attached to a dissecting forceps, the peritoneum overlaying the triangle of Chalot and the hepatoduodenal triangle was dissected away exposing the cystic duct and the cystic artery.  A critical window was developed between the CBD and the cystic duct The cystic artery was clipped proximally and distally then transected.  A clip was placed on the gallbladder side of the cystic duct, then the distal cystic duct was clipped three times then transected between the clips.  The gallbladder was then dissected out of the hepatic bed without event.  It was retrieved from the abdomen using an EcoPouch bag without event.  Once the gallbladder was removed, the bed was inspected for hemostasis.  Once excellent hemostasis was obtained all gas and fluids were aspirated from above the liver, then the cannulas were removed.  The supraumbilical incision was closed using the pursestring suture which was in place.  0.25% bupivicaine with epinephrine was injected at all sites.  All 77mm or greater cannula sites were close using a running subcuticular stitch of 4-0 Monocryl.  5.55mm cannula sites were closed with Dermabond only.Steri-Strips and Tagaderm were used to complete the dressings at all sites.  At this point all needle, sponge, and instrument counts were correct.The patient was awakened from anesthesia and taken to the PACU in stable condition.  Kathryne Eriksson. Dahlia Bailiff, MD, [place] [phone] [facility]  PATIENT DISPOSITION:  PACU - hemodynamically stable.   Judeth Horn  5/24/201911:02 AM

## 2018-03-01 NOTE — Anesthesia Preprocedure Evaluation (Signed)
Anesthesia Evaluation  Patient identified by MRN, date of birth, ID band Patient awake    Reviewed: Allergy & Precautions, NPO status , Patient's Chart, lab work & pertinent test results  Airway Mallampati: II  TM Distance: >3 FB Neck ROM: Full    Dental no notable dental hx.    Pulmonary former smoker,    Pulmonary exam normal breath sounds clear to auscultation       Cardiovascular hypertension, Pt. on medications + CAD  Normal cardiovascular exam+ dysrhythmias Atrial Fibrillation + Valvular Problems/Murmurs (s/p AVR mechanical)  Rhythm:Regular Rate:Normal     Neuro/Psych negative neurological ROS  negative psych ROS   GI/Hepatic negative GI ROS, Neg liver ROS,   Endo/Other  negative endocrine ROSdiabetes  Renal/GU negative Renal ROS  negative genitourinary   Musculoskeletal negative musculoskeletal ROS (+)   Abdominal   Peds negative pediatric ROS (+)  Hematology negative hematology ROS (+)   Anesthesia Other Findings   Reproductive/Obstetrics negative OB ROS                             Anesthesia Physical Anesthesia Plan  ASA: III  Anesthesia Plan: General   Post-op Pain Management:    Induction: Intravenous  PONV Risk Score and Plan: 3 and Ondansetron and Treatment may vary due to age or medical condition  Airway Management Planned: Oral ETT  Additional Equipment:   Intra-op Plan:   Post-operative Plan: Extubation in OR  Informed Consent: I have reviewed the patients History and Physical, chart, labs and discussed the procedure including the risks, benefits and alternatives for the proposed anesthesia with the patient or authorized representative who has indicated his/her understanding and acceptance.   Dental advisory given  Plan Discussed with: CRNA  Anesthesia Plan Comments:         Anesthesia Quick Evaluation

## 2018-03-01 NOTE — Progress Notes (Signed)
ANTICOAGULATION CONSULT NOTE - Follow Up Consult  Pharmacy Consult for Heparin Indication: atrial fibrillation and mechanical valve  Allergies  Allergen Reactions  . Flagyl [Metronidazole] Rash    ALL-OVER BODY RASH  . Coreg [Carvedilol] Other (See Comments)    Terrible cramping in the feet and had a lot of bowel movements, but not diarrhea  . Losartan Swelling    Patient doesn't recall site of swelling  . Spironolactone Nausea Only    Caused extreme nausea  . Verapamil Hives  . Zetia [Ezetimibe] Other (See Comments)    Reaction not recalled  . Zocor [Simvastatin - High Dose] Other (See Comments)    Reaction not recalled   Patient Measurements: Height: 5\' 1"  (154.9 cm) Weight: 143 lb 4.8 oz (65 kg) IBW/kg (Calculated) : 47.8  Vital Signs: Temp: 98.4 F (36.9 C) (05/24 1115) Temp Source: Oral (05/24 0627) BP: 150/54 (05/24 1200) Pulse Rate: 78 (05/24 1200)  Labs: Recent Labs    02/27/18 0445  02/27/18 2102 02/28/18 0716 02/28/18 1649 03/01/18 0247  HGB  --   --   --  9.9*  --  9.8*  HCT  --   --   --  32.0*  --  31.3*  PLT  --   --   --  166  --  227  LABPROT 27.3*  --  23.6* 19.9*  --  18.9*  INR 2.56  --  2.12 1.71  --  1.59  HEPARINUNFRC  --    < > <0.10* 0.15* 0.22* 0.34  CREATININE 0.82  --   --   --   --  0.87   < > = values in this interval not displayed.   Estimated Creatinine Clearance: 45.3 mL/min (by C-G formula based on SCr of 0.87 mg/dL).  Assessment: 77 y.o. female with h/o Afib/AVR, Coumadin on hold; patient s/p laparoscopic cholecystectomy 5/24; surgery has entered an order to resume heparin at 1500 today at the previous rate. No overt bleeding noted and CBC stable over night.   Goal of Therapy:  Heparin level 0.3-0.7 units/ml Monitor platelets by anticoagulation protocol: Yes   Plan:  Resume heparin gtt at 1450 units/hr at 1500  Check heparin level in 8 hours. Daily heparin level and CBC Monitor for s/sx of bleeding  Georga Bora,  PharmD Clinical Pharmacist 03/01/2018 12:47 PM

## 2018-03-01 NOTE — Progress Notes (Addendum)
PROGRESS NOTE    [name]  HOZ:[id number] DOB: November 19, 1940 DOA: 02/21/2018 PCP: [name] Pao, MD   Principal Problem:   Cholecystitis Active Problems:   Hypertension   Hypercholesteremia   Mechanical heart valve present   Chronic anticoagulation   Coronary artery disease   Diabetes mellitus without complication (HCC)   Atrial fibrillation (HCC)   Cholelithiasis   GERD (gastroesophageal reflux disease)   Anxiety   Chronic diastolic CHF (congestive heart failure) (HCC)   Diarrhea   Rash   UTI (urinary tract infection)   Brief Narrative: Patient is a 77 -year-old female with past medical history significant for hypertension, hyperlipidemia, diabetes mellitus, GERD, atrial fibrillation, atrial valve replacement with mechanical valve on Coumadin, peripheral vascular disease, carotid artery stenosis, diastolic CHF, CAD, GI bleed who presents to the emergency department with complaints of abdominal pain, nausea and vomiting.   She was reporting to have abdominal pain on and off for last 3 months with intermittent nausea, vomiting.  Patient was also reported to have diarrhea. CT abdomen/pelvis done here showed cholelithiasis with pericholecystatic fluid.  Ultrasound done here was positive  for cholecystitis cholecystitis.  She has been started on antibiotics.  S/p lap cholecystectomy on 03/01/18   Assessment & Plan:   1)Cholecystitis/cholelithiasis:    S/p lap cholecystectomy on 03/01/18, per surgical team okay to resume IV heparin without bolus at 3 PM on 03/01/2018, Consider stopping Unasyn in a.m. if okay with surgical team.  Continue pain management, Zofran for nausea. cleared by cardiology for surgery with moderate risk  2)Mechanical Aortic valve: Status post aortic valve replacement.  Was on Coumadin at home.  INR was supratherapeutic , she received vitamin K, Per surgical team okay to resume IV heparin without bolus at 3 PM on 03/01/2018, , mechanical valve prosthesis  functioning normally on echo  3)Diarrhea: C. difficile suspected. Diarrhea has stopped. Negative  PCR for C. difficile and GI pathogen panel.  4)Anemia: Hemoglobin the range of 10 . FOBT negative.  Anemia  Panel shows low iron.  SS/P  2 units of PRBC .  Continue iron supplementation  5)Hypertension:  BP is not at goal ,  may use IV Hydralazine 10 mg  Every 4 hours Prn for systolic blood pressure over 160 mmhg, continue irbesartan and metoprolol.  6)H/o CAD-asymptomatic at this time, continue metoprolol, Lipitor and aspirin  7)Diabetes mellitus: Last hemoglobin A1c was 6.4 on 09/19/17.  Patient was on metformin and glipizide at home.  Allow some permissive Hyperglycemia rather than risk life-threatening hypoglycemia in a patient with unreliable oral intake. Use Novolog/Humalog Sliding scale insulin with Accu-Cheks/Fingersticks as ordered  8)History of atrial fibrillation: CHA2DS2-VASc Score is 7. Patient is on Coumadin at home.  Currently on IV heparin as above #2, continue metoprolol for rate control  9)HFpEF/Chronic diastolic CHF/underlying pulmonary hypertension last 2D echo showed ejection fraction of 60 to 65%.  Repeat echo echocardiogram done here showed ejection fraction of 55 to 70%, grade 3 diastolic dysfunction, functioning mechanical aortic valve. Worsening pulmonary hypertension, continue Lasix,    DVT prophylaxis: IV heparin Code Status: Full code Family Communication: None present at the bedside Disposition Plan:  TBD  Consultants: Cardiology, general surgery  Procedures: None  Antimicrobials: Ceftriaxone since 02/21/18, switched to IV Unasyn  Subjective: Female family member at bedside, she required IV Lasix overnight due to concerns about possible volume overload with tachypnea, lethargic now postop   Objective: Vitals:   03/01/18 1300 03/01/18 1331 03/01/18 1400 03/01/18 1442  BP: (!) 150/55 [name])  170/63 (!) 158/55   Pulse: 75 80 64   Resp:      Temp:    97.6 F  (36.4 C)  TempSrc:    Oral  SpO2: 97% 95% 95%   Weight:      Height:        Intake/Output Summary (Last 24 hours) at 03/01/2018 1506 Last data filed at 03/01/2018 1108 Gross per 24 hour  Intake 1573.22 ml  Output 220 ml  Net 1353.22 ml   Filed Weights   02/27/18 0500 02/28/18 0415 03/01/18 0627  Weight: 64.3 kg (141 lb 11.2 oz) 64.9 kg (143 lb 1.6 oz) 65 kg (143 lb 4.8 oz)    Examination:   Physical Exam  Gen:-   In no apparent distress, more sleepy postop HEENT:- [place].AT, No sclera icterus Neck-Supple Neck,No JVD,.  Lungs-  CTAB , with good air movement CV- S1, S2 normal, click of mechanical valve Abd-  +ve B.Sounds, Abd Soft, appropriate postop tenderness  extremity/Skin:- No  edema,   +ve pulses Psych-affect is appropriate, more sleepy Neuro-no new focal deficits, no tremors   Data Reviewed: I have personally reviewed following labs and imaging studies  CBC: Recent Labs  Lab 02/23/18 0711 02/24/18 0450 02/26/18 0456 02/28/18 0716 03/01/18 0247  WBC 3.9* 4.0 4.8 4.1 5.8  NEUTROABS 2.9 3.1  --   --   --   HGB 9.8* 10.0* 10.8* 9.9* 9.8*  HCT 31.9* 32.7* 35.2* 32.0* 31.3*  MCV 73.7* 73.8* 73.9* 74.1* 74.2*  PLT 267 257 270 166 454   Basic Metabolic Panel: Recent Labs  Lab 02/24/18 0450 02/25/18 0514 02/26/18 0456 02/27/18 0445 03/01/18 0247  NA 130* 130* 131* 132* 134*  K 4.0 4.1 4.0 3.6 3.1*  CL 99* 98* 100* 101 102  CO2 22 21* 22 21* 24  GLUCOSE 90 106* 123* 119* 125*  BUN 15 13 10 8 8   CREATININE 1.04* 1.00 0.94 0.82 0.87  CALCIUM 8.3* 8.3* 8.5* 8.3* 7.9*   GFR: Estimated Creatinine Clearance: 45.3 mL/min (by C-G formula based on SCr of 0.87 mg/dL). Liver Function Tests: Recent Labs  Lab 02/26/18 0456 03/01/18 0247  AST 36 28  ALT 16 12*  ALKPHOS 98 75  BILITOT 0.9 0.9  PROT 6.7 6.0*  ALBUMIN 2.4* 2.2*   No results for input(s): LIPASE, AMYLASE in the last 168 hours. No results for input(s): AMMONIA in the last 168 hours. Coagulation  Profile: Recent Labs  Lab 02/26/18 2123 02/27/18 0445 02/27/18 2102 02/28/18 0716 03/01/18 0247  INR 2.73 2.56 2.12 1.71 1.59   Cardiac Enzymes: No results for input(s): CKTOTAL, CKMB, CKMBINDEX, TROPONINI in the last 168 hours. BNP (last 3 results) No results for input(s): PROBNP in the last 8760 hours. HbA1C: No results for input(s): HGBA1C in the last 72 hours. CBG: Recent Labs  Lab 02/28/18 1628 02/28/18 2247 03/01/18 0748 03/01/18 1116 03/01/18 1224  GLUCAP 123* 127* 91 116* 129*   Lipid Profile: No results for input(s): CHOL, HDL, LDLCALC, TRIG, CHOLHDL, LDLDIRECT in the last 72 hours. Thyroid Function Tests: No results for input(s): TSH, T4TOTAL, FREET4, T3FREE, THYROIDAB in the last 72 hours. Anemia Panel: No results for input(s): VITAMINB12, FOLATE, FERRITIN, TIBC, IRON, RETICCTPCT in the last 72 hours. Sepsis Labs: No results for input(s): PROCALCITON, LATICACIDVEN in the last 168 hours.  Recent Results (from the past 240 hour(s))  Culture, blood (Routine X 2) w Reflex to ID Panel     Status: None   Collection Time: 02/22/18 12:00  AM  Result Value Ref Range Status   Specimen Description BLOOD SITE NOT SPECIFIED  Final   Special Requests   Final    BOTTLES DRAWN AEROBIC AND ANAEROBIC Blood Culture adequate volume   Culture   Final    NO GROWTH 5 DAYS Performed at [facility], [address], [place], [place] [address]    Report Status 02/27/2018 FINAL  Final  Culture, blood (Routine X 2) w Reflex to ID Panel     Status: None   Collection Time: 02/22/18  2:54 AM  Result Value Ref Range Status   Specimen Description BLOOD LEFT HAND  Final   Special Requests   Final    BOTTLES DRAWN AEROBIC ONLY Blood Culture adequate volume   Culture   Final    NO GROWTH 5 DAYS Performed at [facility], [place] [address], [place], [place] [address]    Report Status 02/27/2018 FINAL  Final  C difficile quick scan w PCR reflex     Status: None   Collection  Time: 02/22/18 12:09 PM  Result Value Ref Range Status   C Diff antigen NEGATIVE NEGATIVE Final   C Diff toxin NEGATIVE NEGATIVE Final   C Diff interpretation No C. difficile detected.  Final    Comment: Performed at [facility], [place] [address] [place], [place] [address]  Gastrointestinal Panel by PCR , Stool     Status: None   Collection Time: 02/22/18 12:09 PM  Result Value Ref Range Status   Campylobacter species NOT DETECTED NOT DETECTED Final   Plesimonas shigelloides NOT DETECTED NOT DETECTED Final   Salmonella species NOT DETECTED NOT DETECTED Final   Yersinia enterocolitica NOT DETECTED NOT DETECTED Final   Vibrio species NOT DETECTED NOT DETECTED Final   Vibrio cholerae NOT DETECTED NOT DETECTED Final   Enteroaggregative E coli (EAEC) NOT DETECTED NOT DETECTED Final   Enteropathogenic E coli (EPEC) NOT DETECTED NOT DETECTED Final   Enterotoxigenic E coli (ETEC) NOT DETECTED NOT DETECTED Final   Shiga like toxin producing E coli (STEC) NOT DETECTED NOT DETECTED Final   Shigella/Enteroinvasive E coli (EIEC) NOT DETECTED NOT DETECTED Final   Cryptosporidium NOT DETECTED NOT DETECTED Final   Cyclospora cayetanensis NOT DETECTED NOT DETECTED Final   Entamoeba histolytica NOT DETECTED NOT DETECTED Final   Giardia lamblia NOT DETECTED NOT DETECTED Final   Adenovirus F40/41 NOT DETECTED NOT DETECTED Final   Astrovirus NOT DETECTED NOT DETECTED Final   Norovirus GI/GII NOT DETECTED NOT DETECTED Final   Rotavirus A NOT DETECTED NOT DETECTED Final   Sapovirus (I, II, IV, and V) NOT DETECTED NOT DETECTED Final    Comment: Performed at [facility], [address] [place], [place] [address]  Surgical pcr screen     Status: None   Collection Time: 03/01/18  8:06 AM  Result Value Ref Range Status   MRSA, PCR NEGATIVE NEGATIVE Final   Staphylococcus aureus NEGATIVE NEGATIVE Final    Comment: (NOTE) The Xpert SA Assay (FDA approved for NASAL specimens in patients  28 years of age and older), is one component of a comprehensive surveillance program. It is not intended to diagnose infection nor to guide or monitor treatment. Performed at [facility], [place] [address], [place], [place] [address]          Radiology Studies: Dg Chest Port 1 View  Result Date: 03/01/2018 CLINICAL DATA:  Shortness of breath. EXAM: PORTABLE CHEST 1 VIEW COMPARISON:  Chest radiograph February 01, 2017 FINDINGS: Stable cardiomegaly. Status post median sternotomy for CABG. Coronary artery calcification and/or stent. Calcified aortic arch. Scattered calcified granulomas. Small LEFT pleural effusion. Interstitial prominence with mild bronchial wall thickening. No pneumothorax. Calcifications in the breast. Osteopenia. IMPRESSION: Stable cardiomegaly. Interstitial prominence slightly increased from prior examination most consistent with acute on chronic pulmonary edema. Increased small LEFT pleural effusion. Electronically Signed   By: Elon Alas M.D.   On: 03/01/2018 00:05        Scheduled Meds: . ALPRAZolam  0.25-0.5 mg Oral QHS  . aspirin EC  81 mg Oral Q M,W,F  . atorvastatin  80 mg Oral Q supper  . cholecalciferol  1,000 Units Oral Daily  . ferrous sulfate  325 mg Oral BID WC  . furosemide  60 mg Oral Daily  . hydrALAZINE  100 mg Oral TID  . hydrocortisone cream   Topical BID  . insulin aspart  0-9 Units Subcutaneous TID WC  . irbesartan  300 mg Oral Daily  . magnesium oxide  400 mg Oral Daily  . metoprolol tartrate  50 mg Oral BID  . pantoprazole  40 mg Oral Daily  . saccharomyces boulardii  250 mg Oral BID  . vitamin B-12  100 mcg Oral Daily   Continuous Infusions: . ampicillin-sulbactam (UNASYN) IV    . heparin    . lactated ringers 10 mL/hr at 03/01/18 0834     LOS: 8 days   Roxan Hockey, MD [facility]    If 7PM-7AM, please contact night-coverage [email or web address] Password [facility] 03/01/2018, 3:06 PM

## 2018-03-01 NOTE — Transfer of Care (Signed)
Immediate Anesthesia Transfer of Care Note  Patient: [name]  Procedure(s) Performed: LAPAROSCOPIC CHOLECYSTECTOMY (N/A Abdomen)  Patient Location: PACU  Anesthesia Type:General  Level of Consciousness: awake  Airway & Oxygen Therapy: Patient Spontanous Breathing and Patient connected to nasal cannula oxygen  Post-op Assessment: Report given to RN and Post -op Vital signs reviewed and stable  Post vital signs: Reviewed and stable  Last Vitals:  Vitals Value Taken Time  BP 146/70 03/01/2018 11:14 AM  Temp    Pulse 80 03/01/2018 11:16 AM  Resp 26 03/01/2018 11:16 AM  SpO2 93 % 03/01/2018 11:16 AM  Vitals shown include unvalidated device data.  Last Pain:  Vitals:   03/01/18 0627  TempSrc: Oral  PainSc:       Patients Stated Pain Goal: 3 (12/21/92 5859)  Complications: No apparent anesthesia complications

## 2018-03-01 NOTE — Anesthesia Postprocedure Evaluation (Signed)
Anesthesia Post Note  Patient: [name]  Procedure(s) Performed: LAPAROSCOPIC CHOLECYSTECTOMY (N/A Abdomen)     Patient location during evaluation: PACU Anesthesia Type: General Level of consciousness: awake and alert Pain management: pain level controlled Vital Signs Assessment: post-procedure vital signs reviewed and stable Respiratory status: spontaneous breathing, nonlabored ventilation, respiratory function stable and patient connected to nasal cannula oxygen Cardiovascular status: blood pressure returned to baseline and stable Postop Assessment: no apparent nausea or vomiting Anesthetic complications: no    Last Vitals:  Vitals:   03/01/18 1145 03/01/18 1200  BP: (!) 151/55 (!) 150/54  Pulse: 72 78  Resp: (!) 24 (!) 27  Temp:    SpO2: 94% 95%    Last Pain:  Vitals:   03/01/18 1145  TempSrc:   PainSc: 0-No pain                 [name]

## 2018-03-01 NOTE — Progress Notes (Signed)
Pt very drowsy since she came back from surgery. Pt has been using accessory muscles to breath. Lungs diminished. 02 sat's at 97% on 2L02. Dr. Fredric Dine aware and saw pt. Will cont to monitor pt.

## 2018-03-01 NOTE — Anesthesia Procedure Notes (Signed)
Procedure Name: Intubation Date/Time: 03/01/2018 10:11 AM Performed by: Imagene Riches, CRNA Pre-anesthesia Checklist: Patient identified, Emergency Drugs available, Suction available and Patient being monitored Patient Re-evaluated:Patient Re-evaluated prior to induction Oxygen Delivery Method: Circle System Utilized Preoxygenation: Pre-oxygenation with 100% oxygen Induction Type: IV induction Ventilation: Mask ventilation without difficulty Laryngoscope Size: Miller and 2 Grade View: Grade I Tube type: Oral Tube size: 7.0 mm Number of attempts: 1 Airway Equipment and Method: Stylet and Oral airway Placement Confirmation: ETT inserted through vocal cords under direct vision,  positive ETCO2 and breath sounds checked- equal and bilateral Secured at: 20 cm Tube secured with: Tape Dental Injury: Teeth and Oropharynx as per pre-operative assessment

## 2018-03-01 NOTE — Progress Notes (Signed)
Spoke with Pt's son Georgia Lopes and made him aware that pt is going down to surgery. Report given to short stay nurse who stated she would be able to start the IV potassium. CHG bath and surgical swab completed.

## 2018-03-01 NOTE — Progress Notes (Signed)
INR down to 1.59 and heparin has been held.  For lap chole, possible IOC today.  Kathryne Eriksson. Dahlia Bailiff, MD, [place] [phone] [facility] Surgery

## 2018-03-02 ENCOUNTER — Encounter (HOSPITAL_COMMUNITY): Payer: Self-pay | Admitting: General Surgery

## 2018-03-02 LAB — CBC
HCT: 31.9 % — ABNORMAL LOW (ref 36.0–46.0)
Hemoglobin: 9.6 g/dL — ABNORMAL LOW (ref 12.0–15.0)
MCH: 22.9 pg — AB (ref 26.0–34.0)
MCHC: 30.1 g/dL (ref 30.0–36.0)
MCV: 76 fL — AB (ref 78.0–100.0)
PLATELETS: 184 10*3/uL (ref 150–400)
RBC: 4.2 MIL/uL (ref 3.87–5.11)
RDW: 19.2 % — ABNORMAL HIGH (ref 11.5–15.5)
WBC: 4.3 10*3/uL (ref 4.0–10.5)

## 2018-03-02 LAB — COMPREHENSIVE METABOLIC PANEL
ALK PHOS: 69 U/L (ref 38–126)
ALT: 15 U/L (ref 14–54)
AST: 40 U/L (ref 15–41)
Albumin: 2 g/dL — ABNORMAL LOW (ref 3.5–5.0)
Anion gap: 9 (ref 5–15)
BILIRUBIN TOTAL: 1 mg/dL (ref 0.3–1.2)
BUN: 12 mg/dL (ref 6–20)
CALCIUM: 7.8 mg/dL — AB (ref 8.9–10.3)
CO2: 22 mmol/L (ref 22–32)
CREATININE: 0.99 mg/dL (ref 0.44–1.00)
Chloride: 103 mmol/L (ref 101–111)
GFR calc non Af Amer: 53 mL/min — ABNORMAL LOW (ref >60)
Glucose, Bld: 103 mg/dL — ABNORMAL HIGH (ref 65–99)
Potassium: 3.5 mmol/L (ref 3.5–5.1)
SODIUM: 134 mmol/L — AB (ref 135–145)
TOTAL PROTEIN: 5.8 g/dL — AB (ref 6.5–8.1)

## 2018-03-02 LAB — HEPARIN LEVEL (UNFRACTIONATED): Heparin Unfractionated: 0.41 IU/mL (ref 0.30–0.70)

## 2018-03-02 LAB — GLUCOSE, CAPILLARY
GLUCOSE-CAPILLARY: 138 mg/dL — AB (ref 65–99)
GLUCOSE-CAPILLARY: 156 mg/dL — AB (ref 65–99)
Glucose-Capillary: 102 mg/dL — ABNORMAL HIGH (ref 65–99)
Glucose-Capillary: 143 mg/dL — ABNORMAL HIGH (ref 65–99)

## 2018-03-02 LAB — PROTIME-INR
INR: 1.61
Prothrombin Time: 19 seconds — ABNORMAL HIGH (ref 11.4–15.2)

## 2018-03-02 MED ORDER — FUROSEMIDE 40 MG PO TABS
40.0000 mg | ORAL_TABLET | Freq: Every day | ORAL | Status: DC
  Administered 2018-03-03 – 2018-03-04 (×2): 40 mg via ORAL
  Filled 2018-03-02 (×2): qty 1

## 2018-03-02 MED ORDER — WARFARIN SODIUM 10 MG PO TABS
10.0000 mg | ORAL_TABLET | Freq: Once | ORAL | Status: AC
  Administered 2018-03-02: 10 mg via ORAL
  Filled 2018-03-02: qty 1

## 2018-03-02 MED ORDER — WARFARIN - PHARMACIST DOSING INPATIENT
Freq: Every day | Status: DC
  Administered 2018-03-02 – 2018-03-04 (×2)

## 2018-03-02 MED ORDER — POTASSIUM CHLORIDE CRYS ER 20 MEQ PO TBCR
40.0000 meq | EXTENDED_RELEASE_TABLET | Freq: Once | ORAL | Status: AC
  Administered 2018-03-02: 40 meq via ORAL
  Filled 2018-03-02: qty 2

## 2018-03-02 NOTE — Progress Notes (Signed)
Progress Note: General Surgery Service   Assessment/Plan: Patient Active Problem List   Diagnosis Date Noted  . UTI (urinary tract infection) 02/22/2018  . Cholecystitis 02/21/2018  . GERD (gastroesophageal reflux disease) 02/21/2018  . Anxiety 02/21/2018  . Chronic diastolic CHF (congestive heart failure) ([place]) 02/21/2018  . Diarrhea 02/21/2018  . Rash 02/21/2018  . General weakness 02/02/2018  . Macular rash 01/25/2018  . Thrombocytopenia ([place]) 01/25/2018  . Arm pain, diffuse 01/25/2018  . Symptomatic anemia 01/23/2018  . Cholelithiasis 01/23/2018  . Aortic atherosclerosis ([place]) 01/23/2018  . GI bleed 01/18/2018  . Atrial fibrillation ([place]) 01/12/2016  . Coronary artery disease 08/18/2013  . Carotid artery disease ([place]) 08/18/2013  . Peripheral arterial disease ([place]) 08/18/2013  . Diabetes mellitus without complication ([place]) 18/29/9371  . Benign positional vertigo 10/06/2011  . Hyponatremia 10/06/2011  . Influenza 10/06/2011  . Sinusitis 10/06/2011  . Hypertension   . Hypercholesteremia   . Mechanical heart valve present   . Chronic anticoagulation    s/p Procedure(s): LAPAROSCOPIC CHOLECYSTECTOMY 03/01/2018 Aortic mech valve: Hgb stable over 24h -ok to restart coumadin -continue heparin drip  S/p lap chole -advance diet -pain control -ok to ambulate with assist    LOS: 9 days  Chief Complaint/Subjective: Tolerating liquids, moderately sore  Objective: Vital signs in last 24 hours: Temp:  [97.6 F (36.4 C)-99.3 F (37.4 C)] 98.6 F (37 C) (05/25 0527) Pulse Rate:  [64-101] 84 (05/25 0527) Resp:  [20-27] 22 (05/25 0527) BP: (127-193)/(50-72) 131/54 (05/25 0527) SpO2:  [88 %-97 %] 93 % (05/25 0527) Weight:  [67.8 kg (149 lb 7.6 oz)] 67.8 kg (149 lb 7.6 oz) (05/25 0527) Last BM Date: 02/28/18  Intake/Output from previous day: 05/24 0701 - 05/25 0700 In: 904.2 [P.O.:120; I.V.:784.2] Out: 670 [Urine:650; Blood:20] Intake/Output this shift: No  intake/output data recorded.  Abd: incisions c/d/i, some ecchymosis over right lateral site  Extremities: trace edema  Neuro: AOx4  Lab Results: CBC  Recent Labs    03/01/18 2241 03/02/18 0422  WBC 6.1 4.3  HGB 10.5* 9.6*  HCT 34.5* 31.9*  PLT 204 184   BMET Recent Labs    03/01/18 0247 03/02/18 0422  NA 134* 134*  K 3.1* 3.5  CL 102 103  CO2 24 22  GLUCOSE 125* 103*  BUN 8 12  CREATININE 0.87 0.99  CALCIUM 7.9* 7.8*   PT/INR Recent Labs    03/01/18 0247 03/02/18 0422  LABPROT 18.9* 19.0*  INR 1.59 1.61   ABG No results for input(s): PHART, HCO3 in the last 72 hours.  Invalid input(s): PCO2, PO2  Studies/Results:  Anti-infectives: Anti-infectives (From admission, onward)   Start     Dose/Rate Route Frequency Ordered Stop   03/01/18 1300  Ampicillin-Sulbactam (UNASYN) 3 g in sodium chloride 0.9 % 100 mL IVPB     3 g 200 mL/hr over 30 Minutes Intravenous Every 8 hours 03/01/18 1227 03/01/18 1721   02/25/18 1300  Ampicillin-Sulbactam (UNASYN) 3 g in sodium chloride 0.9 % 100 mL IVPB  Status:  Discontinued     3 g 200 mL/hr over 30 Minutes Intravenous Every 8 hours 02/25/18 1245 03/01/18 1227   02/22/18 2200  cefTRIAXone (ROCEPHIN) 2 g in sodium chloride 0.9 % 100 mL IVPB  Status:  Discontinued     2 g 200 mL/hr over 30 Minutes Intravenous Every 24 hours 02/21/18 2312 02/25/18 1222   02/21/18 2330  cefTRIAXone (ROCEPHIN) 1 g in sodium chloride 0.9 % 100 mL IVPB  1 g 200 mL/hr over 30 Minutes Intravenous  Once 02/21/18 2317 02/22/18 0344   02/21/18 2215  cefTRIAXone (ROCEPHIN) 1 g in sodium chloride 0.9 % 100 mL IVPB     1 g 200 mL/hr over 30 Minutes Intravenous  Once 02/21/18 2210 02/21/18 2334      Medications: Scheduled Meds: . ALPRAZolam  0.25-0.5 mg Oral QHS  . aspirin EC  81 mg Oral Q M,W,F  . atorvastatin  80 mg Oral Q supper  . cholecalciferol  1,000 Units Oral Daily  . ferrous sulfate  325 mg Oral BID WC  . furosemide  60 mg Oral  Daily  . hydrALAZINE  100 mg Oral TID  . hydrocortisone cream   Topical BID  . insulin aspart  0-9 Units Subcutaneous TID WC  . irbesartan  300 mg Oral Daily  . magnesium oxide  400 mg Oral Daily  . metoprolol tartrate  50 mg Oral BID  . pantoprazole  40 mg Oral Daily  . saccharomyces boulardii  250 mg Oral BID  . vitamin B-12  100 mcg Oral Daily   Continuous Infusions: . heparin 1,450 Units/hr (03/01/18 2153)   PRN Meds:.acetaminophen, ALPRAZolam, alum & mag hydroxide-simeth, diphenhydrAMINE, hydrALAZINE, morphine injection, ondansetron (ZOFRAN) IV, ondansetron, polyvinyl alcohol, traMADol, zolpidem  Mickeal Skinner, MD Pg# [phone] [facility]

## 2018-03-02 NOTE — Progress Notes (Signed)
PROGRESS NOTE    [name]  KZS:[id number] DOB: 03/09/1941 DOA: 02/21/2018 PCP: [name], MD   Principal Problem:   Cholecystitis Active Problems:   Hypertension   Hypercholesteremia   Mechanical heart valve present   Chronic anticoagulation   Coronary artery disease   Diabetes mellitus without complication (HCC)   Atrial fibrillation (HCC)   Cholelithiasis   GERD (gastroesophageal reflux disease)   Anxiety   Chronic diastolic CHF (congestive heart failure) (HCC)   Diarrhea   Rash   UTI (urinary tract infection)   Brief Narrative: Patient is a 77 -year-old female with past medical history significant for hypertension, hyperlipidemia, diabetes mellitus, GERD, atrial fibrillation, atrial valve replacement with mechanical valve on Coumadin, peripheral vascular disease, carotid artery stenosis, diastolic CHF, CAD, GI bleed who presents to the emergency department with complaints of abdominal pain, nausea and vomiting.   She was reporting to have abdominal pain on and off for last 3 months with intermittent nausea, vomiting.  Patient was also reported to have diarrhea. CT abdomen/pelvis done here showed cholelithiasis with pericholecystatic fluid.  Ultrasound done here was positive  for cholelithiasis/cholecystitis.  Was treated with antibiotics.  S/p lap cholecystectomy on 03/01/18.  IV heparin restarted on 03/01/2018 after 3 PM,  Coumadin to be restarted on 03/02/2018   Assessment & Plan:   1)Cholecystitis/cholelithiasis:    S/p lap cholecystectomy on 03/01/18, per surgical team okay to resume IV heparin without bolus at 3 PM on 03/01/2018,  Continue pain management, Zofran for nausea. cleared by cardiology for surgery with moderate risk  2)Mechanical Aortic valve: Status post aortic valve replacement.  Was on Coumadin at home.  INR was supratherapeutic , she received vitamin K, Per surgical team okay to resume IV heparin without bolus at 3 PM on 03/01/2018, , mechanical valve  prosthesis functioning normally on echo,    Coumadin to be restarted on 03/02/2018  3)Diarrhea: C. difficile suspected. Diarrhea has stopped. Negative  PCR for C. difficile and GI pathogen panel.  4) iron deficiency anemia anemia: Hemoglobin the range of 10 . FOBT negative.  Anemia  Panel shows low serum iron of 9 and iron saturation of 3,.  SS/P  2 units of PRBC .  Continue iron supplementation  5)Hypertension: Improved BP control, continue metoprolol 50 mg daily, hydralazine 100 mg 3 times daily, and irbesatan 300 mg , may use IV Hydralazine 10 mg  Every 4 hours Prn for systolic blood pressure over 160 mmhg,    6)H/o CAD-asymptomatic at this time, continue metoprolol, Lipitor and aspirin  7)Diabetes mellitus: Last hemoglobin A1c was 6.4 on 09/19/17.  Patient was on metformin and glipizide at home.  Allow some permissive Hyperglycemia rather than risk life-threatening hypoglycemia in a patient with unreliable oral intake. Use Novolog/Humalog Sliding scale insulin with Accu-Cheks/Fingersticks as ordered  8)History of atrial fibrillation: CHA2DS2-VASc Score is 7. Patient is on Coumadin at home.  Currently on IV heparin as above #2, continue metoprolol for rate control.  Coumadin to be restarted on 03/02/2018  9)HFpEF/Chronic diastolic CHF/underlying pulmonary hypertension last 2D echo showed ejection fraction of 60 to 65%.  Repeat echo echocardiogram done here showed ejection fraction of 55 to 73%, grade 3 diastolic dysfunction, functioning mechanical aortic valve. Worsening pulmonary hypertension, continue Lasix,    DVT prophylaxis: IV heparin/Coumadin Code Status: Full code Family Communication: None present at the bedside Disposition Plan:  TBD  Consultants: Cardiology, general surgery  Procedures: Lap chole 03/01/2018  Antimicrobials: Ceftriaxone since 02/21/18, switched to IV Unasyn  Subjective: No new concerns, tolerating liquid diet okay   Objective: Vitals:   03/01/18 2141  03/02/18 0527 03/02/18 0911 03/02/18 1401  BP: (!) 155/50 (!) 131/54 (!) 150/74 (!) 147/61  Pulse: (!) 101 84 76 84  Resp: (!) 27 (!) 22    Temp: 99.3 F (37.4 C) 98.6 F (37 C)  98.3 F (36.8 C)  TempSrc: Oral Oral  Oral  SpO2: 93% 93%  93%  Weight:  67.8 kg (149 lb 7.6 oz)    Height:        Intake/Output Summary (Last 24 hours) at 03/02/2018 1538 Last data filed at 03/02/2018 9678 Gross per 24 hour  Intake 304.15 ml  Output 650 ml  Net -345.85 ml   Filed Weights   02/28/18 0415 03/01/18 0627 03/02/18 0527  Weight: 64.9 kg (143 lb 1.6 oz) 65 kg (143 lb 4.8 oz) 67.8 kg (149 lb 7.6 oz)    Examination:   Physical Exam  Gen:-   In no apparent distress, awake and alert  HEENT:- [place].AT, No sclera icterus Neck-Supple Neck,No JVD,.  Lungs-  CTAB , with good air movement CV- S1, S2 normal, click of mechanical valve Abd-  +ve B.Sounds, Abd Soft, appropriate postop tenderness  extremity/Skin:- No  edema,   +ve pulses Psych-affect is appropriate, oriented x3  neuro-no new focal deficits, no tremors   Data Reviewed: I have personally reviewed following labs and imaging studies  CBC: Recent Labs  Lab 02/24/18 0450 02/26/18 0456 02/28/18 0716 03/01/18 0247 03/01/18 2241 03/02/18 0422  WBC 4.0 4.8 4.1 5.8 6.1 4.3  NEUTROABS 3.1  --   --   --   --   --   HGB 10.0* 10.8* 9.9* 9.8* 10.5* 9.6*  HCT 32.7* 35.2* 32.0* 31.3* 34.5* 31.9*  MCV 73.8* 73.9* 74.1* 74.2* 74.7* 76.0*  PLT 257 270 166 227 204 938   Basic Metabolic Panel: Recent Labs  Lab 02/25/18 0514 02/26/18 0456 02/27/18 0445 03/01/18 0247 03/02/18 0422  NA 130* 131* 132* 134* 134*  K 4.1 4.0 3.6 3.1* 3.5  CL 98* 100* 101 102 103  CO2 21* 22 21* 24 22  GLUCOSE 106* 123* 119* 125* 103*  BUN 13 10 8 8 12   CREATININE 1.00 0.94 0.82 0.87 0.99  CALCIUM 8.3* 8.5* 8.3* 7.9* 7.8*   GFR: Estimated Creatinine Clearance: 40.6 mL/min (by C-G formula based on SCr of 0.99 mg/dL). Liver Function Tests: Recent Labs    Lab 02/26/18 0456 03/01/18 0247 03/02/18 0422  AST 36 28 40  ALT 16 12* 15  ALKPHOS 98 75 69  BILITOT 0.9 0.9 1.0  PROT 6.7 6.0* 5.8*  ALBUMIN 2.4* 2.2* 2.0*   No results for input(s): LIPASE, AMYLASE in the last 168 hours. No results for input(s): AMMONIA in the last 168 hours. Coagulation Profile: Recent Labs  Lab 02/27/18 0445 02/27/18 2102 02/28/18 0716 03/01/18 0247 03/02/18 0422  INR 2.56 2.12 1.71 1.59 1.61   Cardiac Enzymes: No results for input(s): CKTOTAL, CKMB, CKMBINDEX, TROPONINI in the last 168 hours. BNP (last 3 results) No results for input(s): PROBNP in the last 8760 hours. HbA1C: No results for input(s): HGBA1C in the last 72 hours. CBG: Recent Labs  Lab 03/01/18 1224 03/01/18 1637 03/01/18 2143 03/02/18 0732 03/02/18 1129  GLUCAP 129* 116* 119* 102* 138*   Lipid Profile: No results for input(s): CHOL, HDL, LDLCALC, TRIG, CHOLHDL, LDLDIRECT in the last 72 hours. Thyroid Function Tests: No results for input(s): TSH, T4TOTAL, FREET4, T3FREE, THYROIDAB in  the last 72 hours. Anemia Panel: No results for input(s): VITAMINB12, FOLATE, FERRITIN, TIBC, IRON, RETICCTPCT in the last 72 hours. Sepsis Labs: No results for input(s): PROCALCITON, LATICACIDVEN in the last 168 hours.  Recent Results (from the past 240 hour(s))  Culture, blood (Routine X 2) w Reflex to ID Panel     Status: None   Collection Time: 02/22/18 12:00 AM  Result Value Ref Range Status   Specimen Description BLOOD SITE NOT SPECIFIED  Final   Special Requests   Final    BOTTLES DRAWN AEROBIC AND ANAEROBIC Blood Culture adequate volume   Culture   Final    NO GROWTH 5 DAYS Performed at [facility], [address], [place], [place] [address]    Report Status 02/27/2018 FINAL  Final  Culture, blood (Routine X 2) w Reflex to ID Panel     Status: None   Collection Time: 02/22/18  2:54 AM  Result Value Ref Range Status   Specimen Description BLOOD LEFT HAND  Final   Special  Requests   Final    BOTTLES DRAWN AEROBIC ONLY Blood Culture adequate volume   Culture   Final    NO GROWTH 5 DAYS Performed at [facility], [place] [address], [place], [place] [address]    Report Status 02/27/2018 FINAL  Final  C difficile quick scan w PCR reflex     Status: None   Collection Time: 02/22/18 12:09 PM  Result Value Ref Range Status   C Diff antigen NEGATIVE NEGATIVE Final   C Diff toxin NEGATIVE NEGATIVE Final   C Diff interpretation No C. difficile detected.  Final    Comment: Performed at [facility], [place] [address], [place], [place] [address]  Gastrointestinal Panel by PCR , Stool     Status: None   Collection Time: 02/22/18 12:09 PM  Result Value Ref Range Status   Campylobacter species NOT DETECTED NOT DETECTED Final   Plesimonas shigelloides NOT DETECTED NOT DETECTED Final   Salmonella species NOT DETECTED NOT DETECTED Final   Yersinia enterocolitica NOT DETECTED NOT DETECTED Final   Vibrio species NOT DETECTED NOT DETECTED Final   Vibrio cholerae NOT DETECTED NOT DETECTED Final   Enteroaggregative E coli (EAEC) NOT DETECTED NOT DETECTED Final   Enteropathogenic E coli (EPEC) NOT DETECTED NOT DETECTED Final   Enterotoxigenic E coli (ETEC) NOT DETECTED NOT DETECTED Final   Shiga like toxin producing E coli (STEC) NOT DETECTED NOT DETECTED Final   Shigella/Enteroinvasive E coli (EIEC) NOT DETECTED NOT DETECTED Final   Cryptosporidium NOT DETECTED NOT DETECTED Final   Cyclospora cayetanensis NOT DETECTED NOT DETECTED Final   Entamoeba histolytica NOT DETECTED NOT DETECTED Final   Giardia lamblia NOT DETECTED NOT DETECTED Final   Adenovirus F40/41 NOT DETECTED NOT DETECTED Final   Astrovirus NOT DETECTED NOT DETECTED Final   Norovirus GI/GII NOT DETECTED NOT DETECTED Final   Rotavirus A NOT DETECTED NOT DETECTED Final   Sapovirus (I, II, IV, and V) NOT DETECTED NOT DETECTED Final    Comment: Performed at [facility], [address]  [place], [place] [address]  Surgical pcr screen     Status: None   Collection Time: 03/01/18  8:06 AM  Result Value Ref Range Status   MRSA, PCR NEGATIVE NEGATIVE Final   Staphylococcus aureus NEGATIVE NEGATIVE Final    Comment: (NOTE) The Xpert SA Assay (FDA approved for NASAL specimens in patients 47 years of age and older), is  one component of a comprehensive surveillance program. It is not intended to diagnose infection nor to guide or monitor treatment. Performed at [facility], [place] [address], [place], [place] [address]          Radiology Studies: Dg Chest Port 1 View  Result Date: 03/01/2018 CLINICAL DATA:  Shortness of breath. EXAM: PORTABLE CHEST 1 VIEW COMPARISON:  Chest radiograph February 01, 2017 FINDINGS: Stable cardiomegaly. Status post median sternotomy for CABG. Coronary artery calcification and/or stent. Calcified aortic arch. Scattered calcified granulomas. Small LEFT pleural effusion. Interstitial prominence with mild bronchial wall thickening. No pneumothorax. Calcifications in the breast. Osteopenia. IMPRESSION: Stable cardiomegaly. Interstitial prominence slightly increased from prior examination most consistent with acute on chronic pulmonary edema. Increased small LEFT pleural effusion. Electronically Signed   By: Elon Alas M.D.   On: 03/01/2018 00:05        Scheduled Meds: . ALPRAZolam  0.25-0.5 mg Oral QHS  . aspirin EC  81 mg Oral Q M,W,F  . atorvastatin  80 mg Oral Q supper  . cholecalciferol  1,000 Units Oral Daily  . ferrous sulfate  325 mg Oral BID WC  . [START ON 03/03/2018] furosemide  40 mg Oral Daily  . hydrALAZINE  100 mg Oral TID  . hydrocortisone cream   Topical BID  . insulin aspart  0-9 Units Subcutaneous TID WC  . irbesartan  300 mg Oral Daily  . magnesium oxide  400 mg Oral Daily  . metoprolol tartrate  50 mg Oral BID  . pantoprazole  40 mg Oral Daily  . potassium chloride  40 mEq Oral Once  . saccharomyces boulardii  250 mg  Oral BID  . vitamin B-12  100 mcg Oral Daily  . warfarin  10 mg Oral ONCE-1800  . Warfarin - Pharmacist Dosing Inpatient   Does not apply q1800   Continuous Infusions: . heparin 1,450 Units/hr (03/01/18 2153)     LOS: 9 days   Roxan Hockey, MD [facility]    If 7PM-7AM, please contact night-coverage [email or web address] Password [facility] 03/02/2018, 3:38 PM

## 2018-03-02 NOTE — Progress Notes (Signed)
ANTICOAGULATION CONSULT NOTE - Pharmacy Consult for Heparin Indication: atrial fibrillation and mechanical valve  Allergies  Allergen Reactions  . Flagyl [Metronidazole] Rash    ALL-OVER BODY RASH  . Coreg [Carvedilol] Other (See Comments)    Terrible cramping in the feet and had a lot of bowel movements, but not diarrhea  . Losartan Swelling    Patient doesn't recall site of swelling  . Spironolactone Nausea Only    Caused extreme nausea  . Verapamil Hives  . Zetia [Ezetimibe] Other (See Comments)    Reaction not recalled  . Zocor [Simvastatin - High Dose] Other (See Comments)    Reaction not recalled   Patient Measurements: Height: 5\' 1"  (154.9 cm) Weight: 143 lb 4.8 oz (65 kg) IBW/kg (Calculated) : 47.8  Vital Signs: Temp: 99.3 F (37.4 C) (05/24 2141) Temp Source: Oral (05/24 2141) BP: 155/50 (05/24 2141) Pulse Rate: 101 (05/24 2141)  Labs: Recent Labs    02/27/18 0445  02/27/18 2102  02/28/18 0716 02/28/18 1649 03/01/18 0247 03/01/18 2241  HGB  --   --   --    < > 9.9*  --  9.8* 10.5*  HCT  --   --   --   --  32.0*  --  31.3* 34.5*  PLT  --   --   --   --  166  --  227 204  LABPROT 27.3*  --  23.6*  --  19.9*  --  18.9*  --   INR 2.56  --  2.12  --  1.71  --  1.59  --   HEPARINUNFRC  --    < > <0.10*  --  0.15* 0.22* 0.34 0.33  CREATININE 0.82  --   --   --   --   --  0.87  --    < > = values in this interval not displayed.   Estimated Creatinine Clearance: 45.3 mL/min (by C-G formula based on SCr of 0.87 mg/dL).  Assessment: 77 y.o. female with h/o Afib/AVR, Coumadin on hold, for heparin  Goal of Therapy:  Heparin level 0.3-0.7 units/ml Monitor platelets by anticoagulation protocol: Yes   Plan:  Continue Heparin at current rate  [name] Knack, PharmD, BCPS  03/02/2018 12:30 AM

## 2018-03-02 NOTE — Progress Notes (Signed)
ANTICOAGULATION CONSULT NOTE - Follow Up Consult  Pharmacy Consult for Heparin Indication: atrial fibrillation and mechanical valve  Allergies  Allergen Reactions  . Flagyl [Metronidazole] Rash    ALL-OVER BODY RASH  . Coreg [Carvedilol] Other (See Comments)    Terrible cramping in the feet and had a lot of bowel movements, but not diarrhea  . Losartan Swelling    Patient doesn't recall site of swelling  . Spironolactone Nausea Only    Caused extreme nausea  . Verapamil Hives  . Zetia [Ezetimibe] Other (See Comments)    Reaction not recalled  . Zocor [Simvastatin - High Dose] Other (See Comments)    Reaction not recalled   Patient Measurements: Height: 5\' 1"  (154.9 cm) Weight: 149 lb 7.6 oz (67.8 kg) IBW/kg (Calculated) : 47.8  Vital Signs: Temp: 98.6 F (37 C) (05/25 0527) Temp Source: Oral (05/25 0527) BP: 150/74 (05/25 0911) Pulse Rate: 76 (05/25 0911)  Labs: Recent Labs    02/28/18 0716  03/01/18 0247 03/01/18 2241 03/02/18 0422  HGB 9.9*  --  9.8* 10.5* 9.6*  HCT 32.0*  --  31.3* 34.5* 31.9*  PLT 166  --  227 204 184  LABPROT 19.9*  --  18.9*  --  19.0*  INR 1.71  --  1.59  --  1.61  HEPARINUNFRC 0.15*   < > 0.34 0.33 0.41  CREATININE  --   --  0.87  --  0.99   < > = values in this interval not displayed.   Estimated Creatinine Clearance: 40.6 mL/min (by C-G formula based on SCr of 0.99 mg/dL).  Assessment: 77 y.o. female with h/o Afib/AVR, Coumadin on hold; patient s/p laparoscopic cholecystectomy 5/24; Heparin bridge resumed yesterday afternoon and now warfarin is being restart. CBC stable, no overt bleeding documented.  Heparin level therapeutic: 0.41 INR:1.61  PTA Warfarin Regimen: 7.5mg  Mon, Wed, Fri and 5mg  all other days  Goal of Therapy:  INR: 2.5-3.5 Heparin level 0.3-0.7 units/ml Monitor platelets by anticoagulation protocol: Yes   Plan:  Warfarin 10mg  x1 tonight Continue heparin gtt at 1450 units/hr Daily heparin level and  CBC Monitor for s/sx of bleeding  Georga Bora, PharmD Clinical Pharmacist 03/02/2018 10:31 AM

## 2018-03-03 LAB — GLUCOSE, CAPILLARY
GLUCOSE-CAPILLARY: 155 mg/dL — AB (ref 65–99)
Glucose-Capillary: 131 mg/dL — ABNORMAL HIGH (ref 65–99)
Glucose-Capillary: 137 mg/dL — ABNORMAL HIGH (ref 65–99)
Glucose-Capillary: 131 mg/dL — ABNORMAL HIGH (ref 65–99)

## 2018-03-03 LAB — CBC
HEMATOCRIT: 32.9 % — AB (ref 36.0–46.0)
Hemoglobin: 9.8 g/dL — ABNORMAL LOW (ref 12.0–15.0)
MCH: 22.5 pg — ABNORMAL LOW (ref 26.0–34.0)
MCHC: 29.8 g/dL — ABNORMAL LOW (ref 30.0–36.0)
MCV: 75.6 fL — ABNORMAL LOW (ref 78.0–100.0)
Platelets: 212 10*3/uL (ref 150–400)
RBC: 4.35 MIL/uL (ref 3.87–5.11)
RDW: 19.2 % — AB (ref 11.5–15.5)
WBC: 5.7 10*3/uL (ref 4.0–10.5)

## 2018-03-03 LAB — PROTIME-INR
INR: 1.64
Prothrombin Time: 19.3 seconds — ABNORMAL HIGH (ref 11.4–15.2)

## 2018-03-03 LAB — HEPARIN LEVEL (UNFRACTIONATED): HEPARIN UNFRACTIONATED: 0.53 [IU]/mL (ref 0.30–0.70)

## 2018-03-03 MED ORDER — POLYETHYLENE GLYCOL 3350 17 G PO PACK
17.0000 g | PACK | Freq: Every day | ORAL | Status: DC
  Administered 2018-03-03: 17 g via ORAL
  Filled 2018-03-03 (×2): qty 1

## 2018-03-03 MED ORDER — SENNOSIDES-DOCUSATE SODIUM 8.6-50 MG PO TABS
2.0000 | ORAL_TABLET | Freq: Once | ORAL | Status: AC
Start: 2018-03-03 — End: 2018-03-03
  Administered 2018-03-03: 2 via ORAL
  Filled 2018-03-03: qty 2

## 2018-03-03 MED ORDER — WARFARIN SODIUM 7.5 MG PO TABS
7.5000 mg | ORAL_TABLET | Freq: Once | ORAL | Status: AC
  Administered 2018-03-03: 7.5 mg via ORAL
  Filled 2018-03-03: qty 1

## 2018-03-03 MED ORDER — ACETAMINOPHEN 325 MG PO TABS: 650.0000 mg | ORAL_TABLET | Freq: Four times a day (QID) | ORAL | Status: DC | PRN

## 2018-03-03 NOTE — Progress Notes (Signed)
Patient was still c/o of pain, had only gone down to an 8 from a 10.  I asked her if she wanted to try Morphine which was ordered for her for severe pain. Patient was reluctant to try any type of pain medication and was only initially wanting heat pack but that was not working any longer.  Morphine was given to patient, I will keep monitoring her.

## 2018-03-03 NOTE — Progress Notes (Signed)
ANTICOAGULATION CONSULT NOTE - Follow Up Consult  Pharmacy Consult for Heparin Indication: atrial fibrillation and mechanical valve  Allergies  Allergen Reactions  . Flagyl [Metronidazole] Rash    ALL-OVER BODY RASH  . Coreg [Carvedilol] Other (See Comments)    Terrible cramping in the feet and had a lot of bowel movements, but not diarrhea  . Losartan Swelling    Patient doesn't recall site of swelling  . Spironolactone Nausea Only    Caused extreme nausea  . Verapamil Hives  . Zetia [Ezetimibe] Other (See Comments)    Reaction not recalled  . Zocor [Simvastatin - High Dose] Other (See Comments)    Reaction not recalled   Patient Measurements: Height: 5\' 1"  (154.9 cm) Weight: 149 lb 7.6 oz (67.8 kg) IBW/kg (Calculated) : 47.8  Vital Signs: Temp: 98.2 F (36.8 C) (05/26 0545) Temp Source: Oral (05/26 0545) BP: 140/63 (05/26 0545) Pulse Rate: 112 (05/26 0838)  Labs: Recent Labs    03/01/18 0247 03/01/18 2241 03/02/18 0422 03/03/18 0341  HGB 9.8* 10.5* 9.6* 9.8*  HCT 31.3* 34.5* 31.9* 32.9*  PLT 227 204 184 212  LABPROT 18.9*  --  19.0* 19.3*  INR 1.59  --  1.61 1.64  HEPARINUNFRC 0.34 0.33 0.41 0.53  CREATININE 0.87  --  0.99  --    Estimated Creatinine Clearance: 40.6 mL/min (by C-G formula based on SCr of 0.99 mg/dL).  Assessment: 77 y.o. female with h/o Afib/AVR, Coumadin on hold; patient s/p laparoscopic cholecystectomy 5/24; Heparin bridge resumed 5/24 and now warfarin is being restart. CBC stable, no overt bleeding documented. Surgery has signed off on this patient.  Heparin level therapeutic: 0.53 INR:1.64 after one warfarin dose  PTA Warfarin Regimen: 7.5mg  Mon, Wed, Fri and 5mg  all other days  Goal of Therapy:  INR: 2.5-3.5 Heparin level 0.3-0.7 units/ml Monitor platelets by anticoagulation protocol: Yes   Plan:  Warfarin 7.5mg  x1 tonight Continue heparin gtt at 1450 units/hr Daily heparin level and CBC Monitor for s/sx of bleeding  Georga Bora, PharmD Clinical Pharmacist 03/03/2018 10:35 AM

## 2018-03-03 NOTE — Progress Notes (Signed)
2 Days Post-Op   Subjective/Chief Complaint: Patient tolerating heart-healthy diet Soreness mostly at umbilicus   Objective: Vital signs in last 24 hours: Temp:  [98.2 F (36.8 C)-98.4 F (36.9 C)] 98.2 F (36.8 C) (05/26 0545) Pulse Rate:  [76-112] 112 (05/26 0838) Resp:  [20-22] 20 (05/26 0545) BP: (140-156)/(60-104) 140/63 (05/26 0545) SpO2:  [91 %-94 %] 92 % (05/26 0545) Last BM Date: 02/28/18  Intake/Output from previous day: 05/25 0701 - 05/26 0700 In: 1021.6 [P.O.:720; I.V.:301.6] Out: 250 [Urine:250] Intake/Output this shift: No intake/output data recorded.  General appearance: alert, cooperative and no distress GI: soft, incisional tenderness mostly at umbilicus Incisions c/d/i; slight bruising around incisions  Lab Results:  Recent Labs    03/02/18 0422 03/03/18 0341  WBC 4.3 5.7  HGB 9.6* 9.8*  HCT 31.9* 32.9*  PLT 184 212   BMET Recent Labs    03/01/18 0247 03/02/18 0422  NA 134* 134*  K 3.1* 3.5  CL 102 103  CO2 24 22  GLUCOSE 125* 103*  BUN 8 12  CREATININE 0.87 0.99  CALCIUM 7.9* 7.8*   PT/INR Recent Labs    03/02/18 0422 03/03/18 0341  LABPROT 19.0* 19.3*  INR 1.61 1.64   ABG No results for input(s): PHART, HCO3 in the last 72 hours.  Invalid input(s): PCO2, PO2  Studies/Results: No results found.  Anti-infectives: Anti-infectives (From admission, onward)   Start     Dose/Rate Route Frequency Ordered Stop   03/01/18 1300  Ampicillin-Sulbactam (UNASYN) 3 g in sodium chloride 0.9 % 100 mL IVPB     3 g 200 mL/hr over 30 Minutes Intravenous Every 8 hours 03/01/18 1227 03/01/18 1721   02/25/18 1300  Ampicillin-Sulbactam (UNASYN) 3 g in sodium chloride 0.9 % 100 mL IVPB  Status:  Discontinued     3 g 200 mL/hr over 30 Minutes Intravenous Every 8 hours 02/25/18 1245 03/01/18 1227   02/22/18 2200  cefTRIAXone (ROCEPHIN) 2 g in sodium chloride 0.9 % 100 mL IVPB  Status:  Discontinued     2 g 200 mL/hr over 30 Minutes Intravenous  Every 24 hours 02/21/18 2312 02/25/18 1222   02/21/18 2330  cefTRIAXone (ROCEPHIN) 1 g in sodium chloride 0.9 % 100 mL IVPB     1 g 200 mL/hr over 30 Minutes Intravenous  Once 02/21/18 2317 02/22/18 0344   02/21/18 2215  cefTRIAXone (ROCEPHIN) 1 g in sodium chloride 0.9 % 100 mL IVPB     1 g 200 mL/hr over 30 Minutes Intravenous  Once 02/21/18 2210 02/21/18 2334      Assessment/Plan: s//p Procedure(s): LAPAROSCOPIC CHOLECYSTECTOMY 03/01/2018 - Dr. Hulen Skains Aortic mech valve: Hgb stable over 24h -ok to restart coumadin   S/p lap chole -advance diet -pain control -ok to ambulate with assist - discharge per primary team based on anticoagulation We will sign off for now.  Discharge instructions placed in chart    LOS: 10 days    [name] 03/03/2018

## 2018-03-03 NOTE — Progress Notes (Signed)
Patient c/o of back pain radiating towards her abdomen.  Stated pain 10/10, she already had heating pack but was not able to relieved her pain.  I offered Tramadol to reduce pain and for her to have some sort of comfort and sleep through the night.  Patient agreed to finally take medication, which she had hesitated and was only using heating pack.  I will keep monitoring the patient.

## 2018-03-03 NOTE — Progress Notes (Signed)
PROGRESS NOTE    [name]  YQM:[id number] DOB: [birth year] DOA: 02/21/2018 PCP: [name], MD   Principal Problem:   Cholecystitis Active Problems:   Hypertension   Hypercholesteremia   Mechanical heart valve present   Chronic anticoagulation   Coronary artery disease   Diabetes mellitus without complication (HCC)   Atrial fibrillation (HCC)   Cholelithiasis   GERD (gastroesophageal reflux disease)   Anxiety   Chronic diastolic CHF (congestive heart failure) (HCC)   Diarrhea   Rash   UTI (urinary tract infection)   Brief Narrative: Patient is a 77 -year-old female with past medical history significant for hypertension, hyperlipidemia, diabetes mellitus, GERD, atrial fibrillation, atrial valve replacement with mechanical valve on Coumadin, peripheral vascular disease, carotid artery stenosis, diastolic CHF, CAD, GI bleed who presents to the emergency department with complaints of abdominal pain, nausea and vomiting.   She was reporting to have abdominal pain on and off for last 3 months with intermittent nausea, vomiting.  Patient was also reported to have diarrhea. CT abdomen/pelvis done here showed cholelithiasis with pericholecystatic fluid.  Ultrasound done here was positive  for cholelithiasis/cholecystitis.  Was treated with antibiotics.  S/p lap cholecystectomy on 03/01/18.  IV heparin restarted on 03/01/2018 after 3 PM,  Coumadin to be restarted on 03/02/2018   Assessment & Plan:   1)Cholecystitis/cholelithiasis:    S/p lap cholecystectomy on 03/01/18, per surgical team okay for possible discharge home from a surgical standpoint with outpatient follow-up     2)Mechanical Aortic valve: Status post aortic valve replacement.  Was on Coumadin at home.  INR was supratherapeutic , she received vitamin K, Per surgical team okay to resume IV heparin without bolus at 3 PM on 03/01/2018, , mechanical valve prosthesis functioning normally on echo,    Coumadin to be restarted  on 03/02/2018, INR 1.6, possible discharge home once INR is therapeutic  3)Diarrhea: C. difficile suspected. Diarrhea has stopped. Negative  PCR for C. difficile and GI pathogen panel.  4) iron deficiency anemia anemia: Hemoglobin stable,. FOBT negative.  Anemia  Panel shows low serum iron of 9 and iron saturation of 3,.  SS/P  2 units of PRBC .  Continue iron supplementation  5)Hypertension: Improved BP control, continue metoprolol 50 mg daily, hydralazine 100 mg 3 times daily, and irbesatan 300 mg , may use IV Hydralazine 10 mg  Every 4 hours Prn for systolic blood pressure over 160 mmhg,    6)H/o CAD-asymptomatic at this time, continue metoprolol, Lipitor and aspirin  7)Diabetes mellitus: Last hemoglobin A1c was 6.4 on 09/19/17.  Patient was on metformin and glipizide at home.  Restart metformin, continue to hold glipizide for now,  use Novolog/Humalog Sliding scale insulin with Accu-Cheks/Fingersticks as ordered  8)History of atrial fibrillation: CHA2DS2-VASc Score is 7. Patient is on Coumadin at home.  Currently on IV heparin as above #2, continue metoprolol for rate control.  Coumadin was restarted on 03/02/2018  9)HFpEF/Chronic diastolic CHF/underlying pulmonary hypertension last 2D echo showed ejection fraction of 60 to 65%.  Repeat echo echocardiogram done here showed ejection fraction of 55 to 52%, grade 3 diastolic dysfunction, functioning mechanical aortic valve. Worsening pulmonary hypertension, continue Lasix,   10)Dispo--possible discharge home if INR is therapeutic issue continues to do well  DVT prophylaxis: IV heparin/Coumadin Code Status: Full code Family Communication: None present at the bedside  Consultants: Cardiology, general surgery  Procedures: Lap chole 03/01/2018  Antimicrobials: Ceftriaxone since 02/21/18, switched to IV Unasyn preop  Subjective: No chest pains no  palpitation no emesis, no fevers, no BM but passing gas   Objective: Vitals:   03/02/18 2235  03/03/18 0545 03/03/18 0838 03/03/18 1339  BP: (!) 143/65 140/63  113/62  Pulse: 97 (!) 111 (!) 112 85  Resp:  20    Temp:  98.2 F (36.8 C)  98.6 F (37 C)  TempSrc:  Oral  Oral  SpO2:  92%  95%  Weight:      Height:        Intake/Output Summary (Last 24 hours) at 03/03/2018 1734 Last data filed at 03/03/2018 1500 Gross per 24 hour  Intake 933.5 ml  Output 250 ml  Net 683.5 ml   Filed Weights   02/28/18 0415 03/01/18 0627 03/02/18 0527  Weight: 64.9 kg (143 lb 1.6 oz) 65 kg (143 lb 4.8 oz) 67.8 kg (149 lb 7.6 oz)    Examination:   Physical Exam  Gen:-   In no apparent distress, awake and alert  HEENT:- [place].AT, No sclera icterus Neck-Supple Neck,No JVD,.  Lungs-  CTAB , with good air movement CV- S1, S2 normal, click of mechanical valve Abd-  +ve B.Sounds, Abd Soft, appropriate postop tenderness  extremity/Skin:- No  edema,   +ve pulses Psych-affect is appropriate, oriented x3  neuro-no new focal deficits, no tremors   Data Reviewed: I have personally reviewed following labs and imaging studies  CBC: Recent Labs  Lab 02/28/18 0716 03/01/18 0247 03/01/18 2241 03/02/18 0422 03/03/18 0341  WBC 4.1 5.8 6.1 4.3 5.7  HGB 9.9* 9.8* 10.5* 9.6* 9.8*  HCT 32.0* 31.3* 34.5* 31.9* 32.9*  MCV 74.1* 74.2* 74.7* 76.0* 75.6*  PLT 166 227 204 184 962   Basic Metabolic Panel: Recent Labs  Lab 02/25/18 0514 02/26/18 0456 02/27/18 0445 03/01/18 0247 03/02/18 0422  NA 130* 131* 132* 134* 134*  K 4.1 4.0 3.6 3.1* 3.5  CL 98* 100* 101 102 103  CO2 21* 22 21* 24 22  GLUCOSE 106* 123* 119* 125* 103*  BUN 13 10 8 8 12   CREATININE 1.00 0.94 0.82 0.87 0.99  CALCIUM 8.3* 8.5* 8.3* 7.9* 7.8*   GFR: Estimated Creatinine Clearance: 40.6 mL/min (by C-G formula based on SCr of 0.99 mg/dL). Liver Function Tests: Recent Labs  Lab 02/26/18 0456 03/01/18 0247 03/02/18 0422  AST 36 28 40  ALT 16 12* 15  ALKPHOS 98 75 69  BILITOT 0.9 0.9 1.0  PROT 6.7 6.0* 5.8*  ALBUMIN  2.4* 2.2* 2.0*   No results for input(s): LIPASE, AMYLASE in the last 168 hours. No results for input(s): AMMONIA in the last 168 hours. Coagulation Profile: Recent Labs  Lab 02/27/18 2102 02/28/18 0716 03/01/18 0247 03/02/18 0422 03/03/18 0341  INR 2.12 1.71 1.59 1.61 1.64   Cardiac Enzymes: No results for input(s): CKTOTAL, CKMB, CKMBINDEX, TROPONINI in the last 168 hours. BNP (last 3 results) No results for input(s): PROBNP in the last 8760 hours. HbA1C: No results for input(s): HGBA1C in the last 72 hours. CBG: Recent Labs  Lab 03/02/18 1614 03/02/18 2132 03/03/18 0750 03/03/18 1126 03/03/18 1620  GLUCAP 143* 156* 131* 137* 131*   Lipid Profile: No results for input(s): CHOL, HDL, LDLCALC, TRIG, CHOLHDL, LDLDIRECT in the last 72 hours. Thyroid Function Tests: No results for input(s): TSH, T4TOTAL, FREET4, T3FREE, THYROIDAB in the last 72 hours. Anemia Panel: No results for input(s): VITAMINB12, FOLATE, FERRITIN, TIBC, IRON, RETICCTPCT in the last 72 hours. Sepsis Labs: No results for input(s): PROCALCITON, LATICACIDVEN in the last 168 hours.  Recent  Results (from the past 240 hour(s))  Culture, blood (Routine X 2) w Reflex to ID Panel     Status: None   Collection Time: 02/22/18 12:00 AM  Result Value Ref Range Status   Specimen Description BLOOD SITE NOT SPECIFIED  Final   Special Requests   Final    BOTTLES DRAWN AEROBIC AND ANAEROBIC Blood Culture adequate volume   Culture   Final    NO GROWTH 5 DAYS Performed at [facility], [address], [place], [place] [address]    Report Status 02/27/2018 FINAL  Final  Culture, blood (Routine X 2) w Reflex to ID Panel     Status: None   Collection Time: 02/22/18  2:54 AM  Result Value Ref Range Status   Specimen Description BLOOD LEFT HAND  Final   Special Requests   Final    BOTTLES DRAWN AEROBIC ONLY Blood Culture adequate volume   Culture   Final    NO GROWTH 5 DAYS Performed at [facility], [place] [address] [place], [place] [address]    Report Status 02/27/2018 FINAL  Final  C difficile quick scan w PCR reflex     Status: None   Collection Time: 02/22/18 12:09 PM  Result Value Ref Range Status   C Diff antigen NEGATIVE NEGATIVE Final   C Diff toxin NEGATIVE NEGATIVE Final   C Diff interpretation No C. difficile detected.  Final    Comment: Performed at [facility], [place] [address] [place], [place] [address]  Gastrointestinal Panel by PCR , Stool     Status: None   Collection Time: 02/22/18 12:09 PM  Result Value Ref Range Status   Campylobacter species NOT DETECTED NOT DETECTED Final   Plesimonas shigelloides NOT DETECTED NOT DETECTED Final   Salmonella species NOT DETECTED NOT DETECTED Final   Yersinia enterocolitica NOT DETECTED NOT DETECTED Final   Vibrio species NOT DETECTED NOT DETECTED Final   Vibrio cholerae NOT DETECTED NOT DETECTED Final   Enteroaggregative E coli (EAEC) NOT DETECTED NOT DETECTED Final   Enteropathogenic E coli (EPEC) NOT DETECTED NOT DETECTED Final   Enterotoxigenic E coli (ETEC) NOT DETECTED NOT DETECTED Final   Shiga like toxin producing E coli (STEC) NOT DETECTED NOT DETECTED Final   Shigella/Enteroinvasive E coli (EIEC) NOT DETECTED NOT DETECTED Final   Cryptosporidium NOT DETECTED NOT DETECTED Final   Cyclospora cayetanensis NOT DETECTED NOT DETECTED Final   Entamoeba histolytica NOT DETECTED NOT DETECTED Final   Giardia lamblia NOT DETECTED NOT DETECTED Final   Adenovirus F40/41 NOT DETECTED NOT DETECTED Final   Astrovirus NOT DETECTED NOT DETECTED Final   Norovirus GI/GII NOT DETECTED NOT DETECTED Final   Rotavirus A NOT DETECTED NOT DETECTED Final   Sapovirus (I, II, IV, and V) NOT DETECTED NOT DETECTED Final    Comment: Performed at [facility], [address] [place], [place] [address]  Surgical pcr screen     Status: None   Collection Time: 03/01/18  8:06 AM  Result Value Ref Range Status   MRSA, PCR NEGATIVE  NEGATIVE Final   Staphylococcus aureus NEGATIVE NEGATIVE Final    Comment: (NOTE) The Xpert SA Assay (FDA approved for NASAL specimens in patients 10 years of age and older), is one component of a comprehensive surveillance program. It is not intended to diagnose infection nor to guide or monitor treatment. Performed at [facility], [place] [address], [place], [place] [address]  Radiology Studies: No results found.      Scheduled Meds: . ALPRAZolam  0.25-0.5 mg Oral QHS  . aspirin EC  81 mg Oral Q M,W,F  . atorvastatin  80 mg Oral Q supper  . cholecalciferol  1,000 Units Oral Daily  . ferrous sulfate  325 mg Oral BID WC  . furosemide  40 mg Oral Daily  . hydrALAZINE  100 mg Oral TID  . hydrocortisone cream   Topical BID  . insulin aspart  0-9 Units Subcutaneous TID WC  . irbesartan  300 mg Oral Daily  . magnesium oxide  400 mg Oral Daily  . metoprolol tartrate  50 mg Oral BID  . pantoprazole  40 mg Oral Daily  . polyethylene glycol  17 g Oral Daily  . saccharomyces boulardii  250 mg Oral BID  . senna-docusate  2 tablet Oral Once  . vitamin B-12  100 mcg Oral Daily  . Warfarin - Pharmacist Dosing Inpatient   Does not apply q1800   Continuous Infusions: . heparin 1,450 Units/hr (03/03/18 0851)     LOS: 10 days   Roxan Hockey, MD [facility]    If 7PM-7AM, please contact night-coverage [email or web address] Password [facility] 03/03/2018, 5:34 PM

## 2018-03-04 ENCOUNTER — Inpatient Hospital Stay (HOSPITAL_COMMUNITY): Payer: Medicare HMO

## 2018-03-04 LAB — BASIC METABOLIC PANEL
Anion gap: 9 (ref 5–15)
BUN: 17 mg/dL (ref 6–20)
CHLORIDE: 102 mmol/L (ref 101–111)
CO2: 21 mmol/L — AB (ref 22–32)
Calcium: 8.3 mg/dL — ABNORMAL LOW (ref 8.9–10.3)
Creatinine, Ser: 0.97 mg/dL (ref 0.44–1.00)
GFR calc Af Amer: >60 mL/min (ref >60)
GFR calc non Af Amer: 54 mL/min — ABNORMAL LOW (ref >60)
GLUCOSE: 120 mg/dL — AB (ref 65–99)
Potassium: 4.6 mmol/L (ref 3.5–5.1)
SODIUM: 132 mmol/L — AB (ref 135–145)

## 2018-03-04 LAB — CBC
HEMATOCRIT: 34.7 % — AB (ref 36.0–46.0)
Hemoglobin: 10.5 g/dL — ABNORMAL LOW (ref 12.0–15.0)
MCH: 23.1 pg — ABNORMAL LOW (ref 26.0–34.0)
MCHC: 30.3 g/dL (ref 30.0–36.0)
MCV: 76.3 fL — AB (ref 78.0–100.0)
Platelets: 242 10*3/uL (ref 150–400)
RBC: 4.55 MIL/uL (ref 3.87–5.11)
RDW: 19.5 % — AB (ref 11.5–15.5)
WBC: 5.9 10*3/uL (ref 4.0–10.5)

## 2018-03-04 LAB — PROTIME-INR
INR: 2.36
Prothrombin Time: 25.6 seconds — ABNORMAL HIGH (ref 11.4–15.2)

## 2018-03-04 LAB — GLUCOSE, CAPILLARY
Glucose-Capillary: 115 mg/dL — ABNORMAL HIGH (ref 65–99)
Glucose-Capillary: 177 mg/dL — ABNORMAL HIGH (ref 65–99)
Glucose-Capillary: 105 mg/dL — ABNORMAL HIGH (ref 65–99)
Glucose-Capillary: 141 mg/dL — ABNORMAL HIGH (ref 65–99)

## 2018-03-04 LAB — HEPARIN LEVEL (UNFRACTIONATED): Heparin Unfractionated: 0.49 IU/mL (ref 0.30–0.70)

## 2018-03-04 MED ORDER — WARFARIN SODIUM 7.5 MG PO TABS
7.5000 mg | ORAL_TABLET | Freq: Once | ORAL | Status: AC
  Administered 2018-03-04: 7.5 mg via ORAL
  Filled 2018-03-04: qty 1

## 2018-03-04 MED ORDER — FUROSEMIDE 10 MG/ML IJ SOLN
20.0000 mg | Freq: Once | INTRAMUSCULAR | Status: AC
  Administered 2018-03-04: 20 mg via INTRAVENOUS
  Filled 2018-03-04: qty 2

## 2018-03-04 NOTE — Evaluation (Signed)
Occupational Therapy Evaluation Patient Details Name: [name] MRN: [id number] DOB: [birth year] Today's Date: 03/04/2018    History of Present Illness Pt is a 77 y/o female admitted secondary to nausea/vomiting. Found to have cholelithiasis and is s/p laparoscopic cholecystectomy. PMH includes a fib, CAD s/p stent placement and CABG, DM, HTN, PAD, and SVR.    Clinical Impression   Pt reports independence at baseline, only using AD when she is sick. Pt presents with generalized weakness and decreased activity tolerance and is functioning at a min guard to supervision level in ADL and mobility. Pt has a supportive family, but is concerned she will be a burden to them. Will follow acutely. Recommending home with [place].    Follow Up Recommendations  Home health OT    Equipment Recommendations  Other (comment)(defer to [facility])    Recommendations for Other Services       Precautions / Restrictions Precautions Precautions: Fall Precaution Comments: watch O2 sats       Mobility Bed Mobility Overal bed mobility: Modified Independent             General bed mobility comments: HOB up  Transfers Overall transfer level: Needs assistance Equipment used: None Transfers: Sit to/from Stand Sit to Stand: Supervision         General transfer comment: pt reports not using a walker except when she is sick, stabilizing on furniture or requiring min guard assist without AD    Balance     Sitting balance-Leahy Scale: Good       Standing balance-Leahy Scale: Fair                             ADL either performed or assessed with clinical judgement   ADL Overall ADL's : Needs assistance/impaired Eating/Feeding: Independent;Sitting   Grooming: Wash/dry hands;Standing;Supervision/safety   Upper Body Bathing: Set up;Sitting   Lower Body Bathing: Supervison/ safety;Sit to/from stand   Upper Body Dressing : Set up;Sitting   Lower Body Dressing:  Supervision/safety;Sit to/from stand   Toilet Transfer: Supervision/safety;Ambulation   Toileting- Clothing Manipulation and Hygiene: Supervision/safety;Sit to/from stand       Functional mobility during ADLs: Min guard(with AD)       Vision Baseline Vision/History: Wears glasses Wears Glasses: At all times Patient Visual Report: No change from baseline       Perception     Praxis      Pertinent Vitals/Pain Pain Assessment: No/denies pain     Hand Dominance Right   Extremity/Trunk Assessment Upper Extremity Assessment Upper Extremity Assessment: Overall WFL for tasks assessed   Lower Extremity Assessment Lower Extremity Assessment: Defer to PT evaluation   Cervical / Trunk Assessment Cervical / Trunk Assessment: Normal   Communication Communication Communication: No difficulties   Cognition Arousal/Alertness: Awake/alert Behavior During Therapy: WFL for tasks assessed/performed Overall Cognitive Status: Within Functional Limits for tasks assessed                                     General Comments       Exercises     Shoulder Instructions      Home Living Family/patient expects to be discharged to:: Private residence Living Arrangements: Alone Available Help at Discharge: Family;Available PRN/intermittently;Neighbor Type of Home: House Home Access: Stairs to enter [employer] of Steps: 3 Entrance Stairs-Rails: Right Home Layout: One level  Bathroom Shower/Tub: Teacher, early years/pre: Handicapped height     Home Equipment: Environmental consultant - 2 wheels;Cane - single point;Wheelchair - manual          Prior Functioning/Environment Level of Independence: Independent        Comments: reports she was taking care of herself and doing all her work        OT Problem List: Decreased activity tolerance;Impaired balance (sitting and/or standing)      OT Treatment/Interventions: Self-care/ADL training;DME and/or  AE instruction;Energy conservation;Balance training;Patient/family education    OT Goals(Current goals can be found in the care plan section) Acute Rehab OT Goals Patient Stated Goal: to go home  OT Goal Formulation: With patient Time For Goal Achievement: 03/11/18 Potential to Achieve Goals: Good ADL Goals Pt Will Perform Grooming: with modified independence;standing Pt Will Perform Lower Body Bathing: with modified independence;sitting/lateral leans Pt Will Perform Lower Body Dressing: with modified independence;with adaptive equipment;sit to/from stand(will be knowledgeable in stocking donner) Pt Will Perform Toileting - Clothing Manipulation and hygiene: with modified independence;sit to/from stand Additional ADL Goal #1: Pt will gather items necessary for ADL around room modified independently. Additional ADL Goal #2: Pt will utilize energy conservation strategies in ADL independently.  OT Frequency: Min 2X/week   Barriers to D/C:            Co-evaluation              AM-PAC PT "6 Clicks" Daily Activity     Outcome Measure Help from another person eating meals?: None Help from another person taking care of personal grooming?: A Little Help from another person toileting, which includes using toliet, bedpan, or urinal?: A Little Help from another person bathing (including washing, rinsing, drying)?: A Little Help from another person to put on and taking off regular upper body clothing?: None Help from another person to put on and taking off regular lower body clothing?: A Little 6 Click Score: 20   End of Session Equipment Utilized During Treatment: Oxygen Nurse Communication: Mobility status  Activity Tolerance: Patient tolerated treatment well Patient left: in chair;with call bell/phone within reach  OT Visit Diagnosis: Other abnormalities of gait and mobility (R26.89)                Time: 0272-5366 OT Time Calculation (min): 37 min Charges:  OT General  Charges $OT Visit: 1 Visit OT Evaluation $OT Eval Moderate Complexity: 1 Mod OT Treatments $Self Care/Home Management : 8-22 mins G-Codes:     04/03/18 Nestor Lewandowsky, OTR/L Pager: [phone]  Werner Lean, Haze [place]den 2018/04/03, 3:58 PM

## 2018-03-04 NOTE — Progress Notes (Signed)
  Oxygen via nasal cannula continuously at 2 L/min with gaseous portability and conserving device  Diagnosis hypoxic respiratory failure secondary to underlying congestive heart failure  O2 sats at rest 86 to 87% on room air, with minimum activity O2 sats 85% on room air O2 sats up to 93% on 2 L of oxygen via nasal cannula

## 2018-03-04 NOTE — Progress Notes (Signed)
PROGRESS NOTE    [name]  VEH:[id number] DOB: [birth year] DOA: 02/21/2018 PCP: [name], MD   Principal Problem:   Cholecystitis Active Problems:   Hypertension   Hypercholesteremia   Mechanical heart valve present   Chronic anticoagulation   Coronary artery disease   Diabetes mellitus without complication (HCC)   Atrial fibrillation (HCC)   Cholelithiasis   GERD (gastroesophageal reflux disease)   Anxiety   Chronic diastolic CHF (congestive heart failure) (HCC)   Diarrhea   Rash   UTI (urinary tract infection)   Brief Narrative: Patient is a 77 -year-old female with past medical history significant for hypertension, hyperlipidemia, diabetes mellitus, GERD, atrial fibrillation, atrial valve replacement with mechanical valve on Coumadin, peripheral vascular disease, carotid artery stenosis, diastolic CHF, CAD, GI bleed who presents to the emergency department with complaints of abdominal pain, nausea and vomiting.   She was reporting to have abdominal pain on and off for last 3 months with intermittent nausea, vomiting.  Patient was also reported to have diarrhea. CT abdomen/pelvis done here showed cholelithiasis with pericholecystatic fluid.  Ultrasound done here was positive  for cholelithiasis/cholecystitis.  Was treated with antibiotics.  S/p lap cholecystectomy on 03/01/18.  IV heparin restarted on 03/01/2018 after 3 PM,  Coumadin was restarted on 03/02/2018 , patient complains of nausea, shortness of breath and lower extremity edema, chest x-ray from 03/04/2018 with congestive heart failure findings   Assessment & Plan:   1)Cholecystitis/cholelithiasis:    S/p Lap cholecystectomy on 03/01/18, per surgical team okay for possible discharge home from a surgical standpoint with outpatient follow-up ,    2)Mechanical Aortic valve: Status post aortic valve replacement.  Was on Coumadin at home.  INR was supratherapeutic , she received vitamin K, Per surgical team okay to  resume IV heparin without bolus at 3 PM on 03/01/2018, , mechanical valve prosthesis functioning normally on echo,    Coumadin was restarted on 03/02/2018, INR 2.3, goal is 2.5 to 3.5, she will continue IV heparin and Coumadin, possible discharge home once INR is therapeutic  3)Diarrhea: C. difficile suspected. Diarrhea has stopped. Negative  PCR for C. difficile and GI pathogen panel.  4) iron deficiency anemia anemia: Hemoglobin stable,. FOBT negative.  Anemia  Panel shows low serum iron of 9 and iron saturation of 3,.  SS/P  2 units of PRBC .  Continue iron supplementation  5)Hypertension: Improved BP control, continue metoprolol 50 mg daily, hydralazine 100 mg 3 times daily, and irbesatan 300 mg , may use IV Hydralazine 10 mg  Every 4 hours Prn for systolic blood pressure over 160 mmhg,    6)H/o CAD-asymptomatic at this time, continue metoprolol, Lipitor and aspirin  7)Diabetes mellitus: Last hemoglobin A1c was 6.4 on 09/19/17.  Patient was on metformin and glipizide at home.  Restart metformin, continue to hold glipizide for now,  use Novolog/Humalog Sliding scale insulin with Accu-Cheks/Fingersticks as ordered  8)History of atrial fibrillation: CHA2DS2-VASc Score is 7. Patient is on Coumadin at home.  Currently on IV heparin as above #2, continue metoprolol for rate control.  Coumadin was restarted on 03/02/2018  9)HFpEF/Chronic diastolic CHF/underlying pulmonary hypertension --- patient complains of shortness of breath, lower extremity edema, chest x-ray from 03/04/2018 consistent with CHF, give additional dose of IV Lasix 20 mg x 1, give TED stockings, last 2D echo showed ejection fraction of 60 to 65%.  Repeat echo echocardiogram done here showed ejection fraction of 55 to 83%, grade 3 diastolic dysfunction, functioning mechanical aortic valve.  Worsening pulmonary hypertension, ,   10)Dispo--patient is requiring additional diuresis , possible discharge home if INR is therapeutic issue continues  to do well  11) acute hypoxic respiratory failure-secondary to CHF, continue incentive spirometry in the postop patient, patient meets criteria for home O2, discussed with case manager, plan to discharge home on home O2, continue diuresis as above #9   DVT prophylaxis: IV heparin/Coumadin Code Status: Full code Family Communication: Son and daughter-in-law at bedside, questions answered  Consultants: Cardiology, general surgery  Procedures: Lap chole 03/01/2018  Antimicrobials: Ceftriaxone since 02/21/18, switched to IV Unasyn preop, stopped post op  Subjective: She feels nauseous, no BM, no fevers, some shortness of breath, lower extremity edema is worse, son and daughter-in-law at bedside, questions answered   Objective: Vitals:   03/03/18 2050 03/04/18 0418 03/04/18 1045 03/04/18 1336  BP: (!) 155/64 (!) 129/58 (!) 154/60   Pulse: 85 82 99 97  Resp:    19  Temp: 98.7 F (37.1 C) 98.5 F (36.9 C)  98.2 F (36.8 C)  TempSrc: Oral Oral  Oral  SpO2: 92% 94%  98%  Weight:  66.7 kg (147 lb 1.6 oz)    Height:        Intake/Output Summary (Last 24 hours) at 03/04/2018 1338 Last data filed at 03/04/2018 0900 Gross per 24 hour  Intake 416.9 ml  Output -  Net 416.9 ml   Filed Weights   03/01/18 0627 03/02/18 0527 03/04/18 0418  Weight: 65 kg (143 lb 4.8 oz) 67.8 kg (149 lb 7.6 oz) 66.7 kg (147 lb 1.6 oz)    Examination:   Physical Exam  Gen:-   In no apparent distress, awake and alert  HEENT:- [place].AT, No sclera icterus Nose- [place] 2 L/min Neck-Supple Neck,No JVD,.  Lungs-diminished in bases, fair air movement CV- S1, S2 normal, click of mechanical valve Abd-  +ve B.Sounds, Abd Soft, appropriate postop tenderness  extremity/Skin:-1-2+ edema,   +ve pulses Psych-affect is appropriate, oriented x3  neuro-no new focal deficits, no tremors   Data Reviewed: I have personally reviewed following labs and imaging studies  CBC: Recent Labs  Lab 03/01/18 0247 03/01/18 2241  03/02/18 0422 03/03/18 0341 03/04/18 0302  WBC 5.8 6.1 4.3 5.7 5.9  HGB 9.8* 10.5* 9.6* 9.8* 10.5*  HCT 31.3* 34.5* 31.9* 32.9* 34.7*  MCV 74.2* 74.7* 76.0* 75.6* 76.3*  PLT 227 204 184 212 381   Basic Metabolic Panel: Recent Labs  Lab 02/26/18 0456 02/27/18 0445 03/01/18 0247 03/02/18 0422 03/04/18 0302  NA 131* 132* 134* 134* 132*  K 4.0 3.6 3.1* 3.5 4.6  CL 100* 101 102 103 102  CO2 22 21* 24 22 21*  GLUCOSE 123* 119* 125* 103* 120*  BUN 10 8 8 12 17   CREATININE 0.94 0.82 0.87 0.99 0.97  CALCIUM 8.5* 8.3* 7.9* 7.8* 8.3*   GFR: Estimated Creatinine Clearance: 41.1 mL/min (by C-G formula based on SCr of 0.97 mg/dL). Liver Function Tests: Recent Labs  Lab 02/26/18 0456 03/01/18 0247 03/02/18 0422  AST 36 28 40  ALT 16 12* 15  ALKPHOS 98 75 69  BILITOT 0.9 0.9 1.0  PROT 6.7 6.0* 5.8*  ALBUMIN 2.4* 2.2* 2.0*   No results for input(s): LIPASE, AMYLASE in the last 168 hours. No results for input(s): AMMONIA in the last 168 hours. Coagulation Profile: Recent Labs  Lab 02/28/18 0716 03/01/18 0247 03/02/18 0422 03/03/18 0341 03/04/18 0302  INR 1.71 1.59 1.61 1.64 2.36   Cardiac Enzymes: No results for  input(s): CKTOTAL, CKMB, CKMBINDEX, TROPONINI in the last 168 hours. BNP (last 3 results) No results for input(s): PROBNP in the last 8760 hours. HbA1C: No results for input(s): HGBA1C in the last 72 hours. CBG: Recent Labs  Lab 03/03/18 1126 03/03/18 1620 03/03/18 2050 03/04/18 0714 03/04/18 1117  GLUCAP 137* 131* 155* 115* 177*   Lipid Profile: No results for input(s): CHOL, HDL, LDLCALC, TRIG, CHOLHDL, LDLDIRECT in the last 72 hours. Thyroid Function Tests: No results for input(s): TSH, T4TOTAL, FREET4, T3FREE, THYROIDAB in the last 72 hours. Anemia Panel: No results for input(s): VITAMINB12, FOLATE, FERRITIN, TIBC, IRON, RETICCTPCT in the last 72 hours. Sepsis Labs: No results for input(s): PROCALCITON, LATICACIDVEN in the last 168  hours.  Recent Results (from the past 240 hour(s))  Surgical pcr screen     Status: None   Collection Time: 03/01/18  8:06 AM  Result Value Ref Range Status   MRSA, PCR NEGATIVE NEGATIVE Final   Staphylococcus aureus NEGATIVE NEGATIVE Final    Comment: (NOTE) The Xpert SA Assay (FDA approved for NASAL specimens in patients 75 years of age and older), is one component of a comprehensive surveillance program. It is not intended to diagnose infection nor to guide or monitor treatment. Performed at [facility], [place] [address], [place], [place] [address]          Radiology Studies: Dg Chest 2 View  Result Date: 03/04/2018 CLINICAL DATA:  Chest pain and SOB for a few days EXAM: CHEST - 2 VIEW COMPARISON:  02/28/2018 FINDINGS: Stable changes from prior cardiac surgery. Cardiac silhouette is mildly enlarged. No mediastinal or hilar masses. No evidence of adenopathy. Small left pleural effusion. There is bilateral interstitial thickening and vascular prominence, which is similar to the prior exam. Stable granuloma are noted in the right mid lung and left lung base. Streaky opacity at the left lung base is consistent with atelectasis, stable from the most recent prior study. Skeletal structures are demineralized but grossly intact. IMPRESSION: 1. Findings suggest mild acute on chronic congestive heart failure similar to the prior exam, with vascular congestion and interstitial thickening as well as a small left pleural effusion and cardiomegaly. Electronically Signed   By: Lajean Manes M.D.   On: 03/04/2018 11:43        Scheduled Meds: . ALPRAZolam  0.25-0.5 mg Oral QHS  . aspirin EC  81 mg Oral Q M,W,F  . atorvastatin  80 mg Oral Q supper  . cholecalciferol  1,000 Units Oral Daily  . ferrous sulfate  325 mg Oral BID WC  . furosemide  20 mg Intravenous Once  . furosemide  40 mg Oral Daily  . hydrALAZINE  100 mg Oral TID  . hydrocortisone cream   Topical BID  . insulin aspart   0-9 Units Subcutaneous TID WC  . irbesartan  300 mg Oral Daily  . magnesium oxide  400 mg Oral Daily  . metoprolol tartrate  50 mg Oral BID  . pantoprazole  40 mg Oral Daily  . polyethylene glycol  17 g Oral Daily  . saccharomyces boulardii  250 mg Oral BID  . vitamin B-12  100 mcg Oral Daily  . warfarin  7.5 mg Oral ONCE-1800  . Warfarin - Pharmacist Dosing Inpatient   Does not apply q1800   Continuous Infusions: . heparin 1,450 Units/hr (03/03/18 0851)     LOS: 11 days   Roxan Hockey, MD [facility]    If 7PM-7AM, please contact night-coverage [email or web address] Password  [email or web address] 03/04/2018, 1:38 PM

## 2018-03-04 NOTE — Evaluation (Signed)
Physical Therapy Evaluation Patient Details Name: [name] MRN: [id number] DOB: Jun 12, 1941 Today's Date: 03/04/2018   History of Present Illness  Pt is a 77 y/o female admitted secondary to nausea/vomiting. Found to have cholelithiasis and is s/p laparoscopic cholecystectomy. PMH includes a fib, CAD s/p stent placement and CABG, DM, HTN, PAD, and SVR.   Clinical Impression  Pt admitted secondary to problem above with deficits below. Pt presenting with abdominal pain, weakness, and mild unsteadiness. REquired min guard A for mobility with RW. Pt's oxygen sats decreasing to 86% on RA, however, with 2L of oxygen, sats maintained at 92-96%. Discussed SNF vs. HHPT, and pt reports she would prefer to go home at this time. Reports family can check on her at least once throughout the day and has someone to stay with her at night. Will continue to follow acutely to maximize functional mobility independence and safety.     Follow Up Recommendations Home health PT;Supervision for mobility/OOB    Equipment Recommendations  Other (comment)(tub bench )    Recommendations for Other Services OT consult     Precautions / Restrictions Precautions Precautions: Fall;Other (comment) Precaution Comments: watch O2 sats  Restrictions Weight Bearing Restrictions: No      Mobility  Bed Mobility               General bed mobility comments: In chair upon entry.   Transfers Overall transfer level: Needs assistance Equipment used: Rolling walker (2 wheeled) Transfers: Sit to/from Stand Sit to Stand: Min guard         General transfer comment: Min guard for safety. Verbal cues for safe hand placement.   Ambulation/Gait Ambulation/Gait assistance: Min guard Ambulation Distance (Feet): 125 Feet Assistive device: Rolling walker (2 wheeled) Gait Pattern/deviations: Step-through pattern;Decreased stride length Gait velocity: Decreased  Gait velocity interpretation: <1.8 ft/sec, indicate of  risk for recurrent falls General Gait Details: Slow, cautious gait. Mild unsteadiness noted, however, no overt LOB. Min guard for safety and required standing rest X1 secondary to fatigue. Oxygen sats decreasing to 86% on RA, however, returned to 92-96% on 2L.   Stairs            Wheelchair Mobility    Modified Rankin (Stroke Patients Only)       Balance Overall balance assessment: Needs assistance Sitting-balance support: No upper extremity supported;Feet supported Sitting balance-Leahy Scale: Good     Standing balance support: Bilateral upper extremity supported;No upper extremity supported;During functional activity Standing balance-Leahy Scale: Fair Standing balance comment: Able to maintain static standing at sink to wash hands.                              Pertinent Vitals/Pain Pain Assessment: Faces Faces Pain Scale: Hurts little more Pain Location: abdomen  Pain Descriptors / Indicators: Sore;Operative site guarding Pain Intervention(s): Limited activity within patient's tolerance;Monitored during session;Repositioned    Home Living Family/patient expects to be discharged to:: Private residence Living Arrangements: Alone Available Help at Discharge: Family;Available PRN/intermittently Type of Home: House Home Access: Stairs to enter Entrance Stairs-Rails: Right Entrance Stairs-Number of Steps: 3 Home Layout: One level Home Equipment: Walker - 2 wheels;Cane - single point;Wheelchair - manual      Prior Function Level of Independence: Needs assistance   Gait / Transfers Assistance Needed: Used RW for ambulation   ADL's / Homemaking Assistance Needed: Required assist for bathing.         Hand Dominance  Dominant Hand: Right    Extremity/Trunk Assessment   Upper Extremity Assessment Upper Extremity Assessment: Defer to OT evaluation    Lower Extremity Assessment Lower Extremity Assessment: Generalized weakness    Cervical / Trunk  Assessment Cervical / Trunk Assessment: Normal  Communication   Communication: No difficulties  Cognition Arousal/Alertness: Awake/alert Behavior During Therapy: WFL for tasks assessed/performed Overall Cognitive Status: Within Functional Limits for tasks assessed                                        General Comments General comments (skin integrity, edema, etc.): Pt's daughter present at end of session. Educated about assist required for bathing and at night. Discussed SNF vs home with HHPT, however, pt wanting to go home at d/c.     Exercises     Assessment/Plan    PT Assessment Patient needs continued PT services  PT Problem List Decreased strength;Decreased balance;Decreased activity tolerance;Decreased mobility;Decreased knowledge of use of DME;Decreased knowledge of precautions;Cardiopulmonary status limiting activity       PT Treatment Interventions DME instruction;Gait training;Stair training;Functional mobility training;Therapeutic activities;Therapeutic exercise;Balance training;Patient/family education    PT Goals (Current goals can be found in the Care Plan section)  Acute Rehab PT Goals Patient Stated Goal: to go home  PT Goal Formulation: With patient Time For Goal Achievement: 03/18/18 Potential to Achieve Goals: Good    Frequency Min 3X/week   Barriers to discharge        Co-evaluation               AM-PAC PT "6 Clicks" Daily Activity  Outcome Measure Difficulty turning over in bed (including adjusting bedclothes, sheets and blankets)?: A Little Difficulty moving from lying on back to sitting on the side of the bed? : A Little Difficulty sitting down on and standing up from a chair with arms (e.g., wheelchair, bedside commode, etc,.)?: Unable Help needed moving to and from a bed to chair (including a wheelchair)?: A Little Help needed walking in hospital room?: A Little Help needed climbing 3-5 steps with a railing? : A Lot 6  Click Score: 15    End of Session Equipment Utilized During Treatment: Gait belt;Oxygen Activity Tolerance: Patient tolerated treatment well Patient left: in chair;with call bell/phone within reach;with nursing/sitter in room;with family/visitor present Nurse Communication: Mobility status;Other (comment)(pt with BM) PT Visit Diagnosis: Unsteadiness on feet (R26.81);Muscle weakness (generalized) (M62.81)    Time: 1000-1045 PT Time Calculation (min) (ACUTE ONLY): 45 min   Charges:   PT Evaluation $PT Eval Low Complexity: 1 Low PT Treatments $Gait Training: 8-22 mins $Therapeutic Activity: 8-22 mins   PT G Codes:        Leighton Ruff, PT, DPT  Acute Rehabilitation Services  Pager: [phone]   Rudean Hitt 03/04/2018, 10:58 AM

## 2018-03-04 NOTE — Care Management Important Message (Signed)
Important Message  Patient Details  Name: [name] MRN: [id number] Date of Birth: May 16, 1941   Medicare Important Message Given:  Yes    Barb Merino Lenzie Montesano 03/04/2018, 12:53 PM

## 2018-03-04 NOTE — Progress Notes (Signed)
ANTICOAGULATION CONSULT NOTE - Follow Up Consult  Pharmacy Consult for Heparin Indication: atrial fibrillation and mechanical valve  Allergies  Allergen Reactions  . Flagyl [Metronidazole] Rash    ALL-OVER BODY RASH  . Coreg [Carvedilol] Other (See Comments)    Terrible cramping in the feet and had a lot of bowel movements, but not diarrhea  . Losartan Swelling    Patient doesn't recall site of swelling  . Spironolactone Nausea Only    Caused extreme nausea  . Verapamil Hives  . Zetia [Ezetimibe] Other (See Comments)    Reaction not recalled  . Zocor [Simvastatin - High Dose] Other (See Comments)    Reaction not recalled   Patient Measurements: Height: 5\' 1"  (154.9 cm) Weight: 147 lb 1.6 oz (66.7 kg) IBW/kg (Calculated) : 47.8  Vital Signs: Temp: 98.5 F (36.9 C) (05/27 0418) Temp Source: Oral (05/27 0418) BP: 129/58 (05/27 0418) Pulse Rate: 82 (05/27 0418)  Labs: Recent Labs    03/01/18 2241 03/02/18 0422 03/03/18 0341 03/04/18 0302  HGB 10.5* 9.6* 9.8* 10.5*  HCT 34.5* 31.9* 32.9* 34.7*  PLT 204 184 212 242  LABPROT  --  19.0* 19.3* 25.6*  INR  --  1.61 1.64 2.36  HEPARINUNFRC 0.33 0.41 0.53  --   CREATININE  --  0.99  --  0.97   Estimated Creatinine Clearance: 41.1 mL/min (by C-G formula based on SCr of 0.97 mg/dL).  Assessment: 77 y.o. female with h/o Afib/AVR with mechanical valve. Coumadin was on hold for laparoscopic cholecystectomy on 5/24. Heparin bridge resumed 5/24 and warfarin restarted. CBC stable, no overt bleeding documented. Surgery has signed off on this patient.  Heparin level therapeutic: 0.49 INR: 2.36  PTA Warfarin Regimen: 7.5mg  Mon, Wed, Fri and 5mg  all other days  Goal of Therapy:   INR: 2.5-3.5 Heparin level 0.3-0.7 units/ml Monitor platelets by anticoagulation protocol: Yes   Plan:  Warfarin 7.5mg  x1 tonight Continue heparin gtt at 1450 units/hr Daily heparin level and CBC Monitor for s/sx of bleeding  Leroy Libman,  PharmD Pharmacy Resident Pager: [phone]

## 2018-03-04 NOTE — Progress Notes (Signed)
SATURATION QUALIFICATIONS: (This note is used to comply with regulatory documentation for home oxygen)  Patient Saturations on Room Air at Rest = 90%  Patient Saturations on Room Air while Ambulating = 86%  Patient Saturations on 2 Liters of oxygen while Ambulating = 92%  Please briefly explain why patient needs home oxygen: Pt unable to maintain adequate oxygen saturations on RA, and required 2L of oxygen to maintain sats at 92%-96%. Will need home O2 to ensure appropriate oxygen saturations with mobility.   Leighton Ruff, PT, DPT  Acute Rehabilitation Services  Pager: [phone]

## 2018-03-04 NOTE — Care Management Note (Signed)
Case Management Note  Patient Details  Name: [name] MRN: [id number] Date of Birth: January 09, 1941  Subjective/Objective:                    Action/Plan: CM consulted for home oxygen. CM met with the patient and she would like to use [facility]. Dan with [facility] notified and will have portable tank to the room for d/c and home equipment and oxygen arranged.  Pt is active with [facility]. MD placed orders for [facility] RN/PT. [facility] will need to be updated at d/c. Pt states family can provide transportation home when she is ready for d/c.   Expected Discharge Date:                  Expected Discharge Plan:  [place]  In-House Referral:  NA  Discharge planning Services  CM Consult  Post Acute Care Choice:  Home Health, Resumption of Svcs/PTA Provider, Durable Medical Equipment Choice offered to:  Patient  DME Arranged:  Oxygen DME Agency:  [place]:  PT [facility] Agency:  [facility]  Status of Service:  In process, will continue to follow  If discussed at Long Length of Stay Meetings, dates discussed:    Additional Comments:  Pollie Friar, RN 03/04/2018, 2:13 PM

## 2018-03-05 LAB — BASIC METABOLIC PANEL
ANION GAP: 7 (ref 5–15)
BUN: 18 mg/dL (ref 6–20)
CO2: 25 mmol/L (ref 22–32)
Calcium: 8.3 mg/dL — ABNORMAL LOW (ref 8.9–10.3)
Chloride: 101 mmol/L (ref 101–111)
Creatinine, Ser: 1.03 mg/dL — ABNORMAL HIGH (ref 0.44–1.00)
GFR calc Af Amer: 58 mL/min — ABNORMAL LOW (ref >60)
GFR, EST NON AFRICAN AMERICAN: 50 mL/min — AB (ref >60)
Glucose, Bld: 123 mg/dL — ABNORMAL HIGH (ref 65–99)
POTASSIUM: 4.1 mmol/L (ref 3.5–5.1)
SODIUM: 133 mmol/L — AB (ref 135–145)

## 2018-03-05 LAB — GLUCOSE, CAPILLARY: GLUCOSE-CAPILLARY: 118 mg/dL — AB (ref 65–99)

## 2018-03-05 LAB — CBC
HEMATOCRIT: 31.8 % — AB (ref 36.0–46.0)
Hemoglobin: 9.5 g/dL — ABNORMAL LOW (ref 12.0–15.0)
MCH: 22.6 pg — AB (ref 26.0–34.0)
MCHC: 29.9 g/dL — AB (ref 30.0–36.0)
MCV: 75.7 fL — ABNORMAL LOW (ref 78.0–100.0)
PLATELETS: 180 10*3/uL (ref 150–400)
RBC: 4.2 MIL/uL (ref 3.87–5.11)
RDW: 19.6 % — AB (ref 11.5–15.5)
WBC: 4.1 10*3/uL (ref 4.0–10.5)

## 2018-03-05 LAB — PROTIME-INR
INR: 3.54
Prothrombin Time: 35.2 seconds — ABNORMAL HIGH (ref 11.4–15.2)

## 2018-03-05 LAB — HEPARIN LEVEL (UNFRACTIONATED): Heparin Unfractionated: 0.47 IU/mL (ref 0.30–0.70)

## 2018-03-05 MED ORDER — TRAMADOL HCL 50 MG PO TABS: 50.0000 mg | ORAL_TABLET | Freq: Four times a day (QID) | ORAL | 0 refills | Status: DC | PRN

## 2018-03-05 MED ORDER — ONDANSETRON 4 MG PO TBDP: ORAL_TABLET | ORAL | 0 refills | Status: DC

## 2018-03-05 MED ORDER — FERROUS SULFATE 325 (65 FE) MG PO TABS: 325.0000 mg | ORAL_TABLET | Freq: Two times a day (BID) | ORAL | 3 refills | Status: DC

## 2018-03-05 MED ORDER — SACCHAROMYCES BOULARDII 250 MG PO CAPS: 250.0000 mg | ORAL_CAPSULE | Freq: Two times a day (BID) | ORAL | 1 refills | Status: DC

## 2018-03-05 MED ORDER — FUROSEMIDE 10 MG/ML IJ SOLN
20.0000 mg | Freq: Once | INTRAMUSCULAR | Status: AC
  Administered 2018-03-05: 20 mg via INTRAVENOUS
  Filled 2018-03-05: qty 2

## 2018-03-05 MED ORDER — FUROSEMIDE 20 MG PO TABS: 60.0000 mg | ORAL_TABLET | Freq: Every day | ORAL | 2 refills | Status: DC

## 2018-03-05 NOTE — Progress Notes (Signed)
ANTICOAGULATION CONSULT NOTE - Follow Up Consult  Pharmacy Consult for Heparin Indication: atrial fibrillation and mechanical valve  Allergies  Allergen Reactions  . Flagyl [Metronidazole] Rash    ALL-OVER BODY RASH  . Coreg [Carvedilol] Other (See Comments)    Terrible cramping in the feet and had a lot of bowel movements, but not diarrhea  . Losartan Swelling    Patient doesn't recall site of swelling  . Spironolactone Nausea Only    Caused extreme nausea  . Verapamil Hives  . Zetia [Ezetimibe] Other (See Comments)    Reaction not recalled  . Zocor [Simvastatin - High Dose] Other (See Comments)    Reaction not recalled   Patient Measurements: Height: 5\' 1"  (154.9 cm) Weight: 147 lb 4.8 oz (66.8 kg) IBW/kg (Calculated) : 47.8  Vital Signs: Temp: 98.7 F (37.1 C) (05/28 0525) Temp Source: Oral (05/28 0525) BP: 136/69 (05/28 0525) Pulse Rate: 96 (05/28 0525)  Labs: Recent Labs    03/03/18 0341 03/04/18 0302 03/04/18 1243 03/05/18 0702 03/05/18 0706  HGB 9.8* 10.5*  --  9.5*  --   HCT 32.9* 34.7*  --  31.8*  --   PLT 212 242  --  180  --   LABPROT 19.3* 25.6*  --  35.2*  --   INR 1.64 2.36  --  3.54  --   HEPARINUNFRC 0.53  --  0.49  --  0.47  CREATININE  --  0.97  --  1.03*  --    Estimated Creatinine Clearance: 38.7 mL/min (A) (by C-G formula based on SCr of 1.03 mg/dL (H)).  Assessment: 77 y.o. female with h/o Afib/AVR with mechanical valve on Coumadin - previously on hold for laparoscopic cholecystectomy on 5/24. Heparin bridge resumed 5/24 and warfarin restarted (goal INR 2.5-3.5). INR with large jump overnight 2.36>3.54 after 3rd warfarin dose resumed. Heparin d/c'd 5/28. Hg down to 9.5, plt down to 180, no overt bleeding documented. Surgery has signed off on this patient. Antibiotics completed 5/24. No other interacting medications noted.  PTA Warfarin Regimen: 7.5mg  Mon, Wed, Fri and 5mg  all other days  Goal of Therapy:   INR: 2.5-3.5 Monitor platelets  by anticoagulation protocol: Yes   Plan:  Heparin d/c'd with therapeutic INR No warfarin dose ordered as patient is discharging today Monitor daily INR, CBC, s/sx bleeding  Elicia Lamp, PharmD, BCPS Clinical Pharmacist Clinical phone for 03/05/2018 until 3:30pm: [phone] If after 3:30pm, please call main pharmacy at: [phone] 03/05/2018 10:53 AM

## 2018-03-05 NOTE — Progress Notes (Signed)
Occupational Therapy Treatment Patient Details Name: [name] MRN: [id number] DOB: [birth year] Today's Date: 03/05/2018    History of present illness Pt is a 77 y/o female admitted secondary to nausea/vomiting. Found to have cholelithiasis and is s/p laparoscopic cholecystectomy. PMH includes a fib, CAD s/p stent placement and CABG, DM, HTN, PAD, and SVR.    OT comments  Pt progressing towards OT goals, presents sitting EOB agreeable to tx session. Pt completing room level functional mobility without AD and minguard assist, completing toileting and standing grooming ADLs with overall supervision. Pt continues to demonstrate decreased activity tolerance and requires min cues to initiate rest breaks. Pt on 2L O2 during session, lowest SpO2 85% with room level activity, quickly rebounds to above 90% with seated/standing rest breaks.  Educated pt on AE for completing LB dressing with pt verbalizing and return demonstrating understanding. Feel POC remains appropriate at this time. Will continue to follow acutely to progress pt towards established OT goals.    Follow Up Recommendations  Home health OT    Equipment Recommendations  Other (comment)(defer to [facility] )          Precautions / Restrictions Precautions Precautions: Fall Precaution Comments: watch O2 sats  Restrictions Weight Bearing Restrictions: No       Mobility Bed Mobility               General bed mobility comments: Pt sitting EOB upon entering room   Transfers Overall transfer level: Needs assistance Equipment used: None Transfers: Sit to/from Stand Sit to Stand: Supervision         General transfer comment: supervision for safety, no physical assist needed     Balance Overall balance assessment: Needs assistance Sitting-balance support: No upper extremity supported;Feet supported Sitting balance-Leahy Scale: Good     Standing balance support: No upper extremity supported;During functional  activity Standing balance-Leahy Scale: Fair Standing balance comment: Able to maintain static standing during ADL tasks                            ADL either performed or assessed with clinical judgement   ADL Overall ADL's : Needs assistance/impaired     Grooming: Wash/dry hands;Standing;Supervision/safety               Lower Body Dressing: Supervision/safety;Sit to/from stand Lower Body Dressing Details (indicate cue type and reason): educated pt on use of sock aid and reacher to complete LB ADLs; pt return demonstrating understanding  Toilet Transfer: Supervision/safety;Ambulation;BSC Toilet Transfer Details (indicate cue type and reason): BSC placed in room  Toileting- Clothing Manipulation and Hygiene: Supervision/safety;Sit to/from stand;Set up;Sitting/lateral lean Toileting - Clothing Manipulation Details (indicate cue type and reason): pt completing clothing management with close supervision, peri-care using lateral leans with setup assist      Functional mobility during ADLs: Min guard General ADL Comments: lowest SpO2 85% on 2L with room level activity, quickly rebounds to above 90%, overall remaining above 90%; pt does appear SOB with minimal room level activity and requires verbal cues to initiate seated rest break                        Cognition Arousal/Alertness: Awake/alert Behavior During Therapy: WFL for tasks assessed/performed Overall Cognitive Status: Within Functional Limits for tasks assessed  Pertinent Vitals/ Pain       Faces Pain Scale: Hurts a little bit Pain Location: back  Pain Descriptors / Indicators: Sore Pain Intervention(s): Monitored during session;Limited activity within patient's tolerance;Repositioned                                                          Frequency  Min 2X/week        Progress Toward  Goals  OT Goals(current goals can now be found in the care plan section)  Progress towards OT goals: Progressing toward goals  Acute Rehab OT Goals Patient Stated Goal: to go home  OT Goal Formulation: With patient Time For Goal Achievement: 03/11/18 Potential to Achieve Goals: Good  Plan Discharge plan remains appropriate                     AM-PAC PT "6 Clicks" Daily Activity     Outcome Measure   Help from another person eating meals?: None Help from another person taking care of personal grooming?: A Little Help from another person toileting, which includes using toliet, bedpan, or urinal?: A Little Help from another person bathing (including washing, rinsing, drying)?: A Little Help from another person to put on and taking off regular upper body clothing?: None Help from another person to put on and taking off regular lower body clothing?: A Little 6 Click Score: 20    End of Session Equipment Utilized During Treatment: Oxygen;Gait belt  OT Visit Diagnosis: Other abnormalities of gait and mobility (R26.89)   Activity Tolerance Patient tolerated treatment well   Patient Left in chair;with call bell/phone within reach   Nurse Communication Mobility status        Time: 6962-9528 OT Time Calculation (min): 30 min  Charges: OT General Charges $OT Visit: 1 Visit OT Treatments $Self Care/Home Management : 23-37 mins  Lou Cal, OT Pager [phone] 03/05/2018    Raymondo Band 03/05/2018, 12:08 PM

## 2018-03-05 NOTE — Discharge Instructions (Signed)
1) recheck PT/INR, BMP and CBC on Friday, 03/08/2018 2) use oxygen via nasal cannula at 2 L continuously as advised 3)Avoid ibuprofen/Advil/Aleve/Motrin/Goody Powders/Naproxen/BC powders/Meloxicam/Diclofenac/Indomethacin and other Nonsteroidal anti-inflammatory medications as these will make you more likely to bleed and can cause stomach ulcers, can also cause Kidney problems.  4)Use TEDs stockings, put them on in the morning and take them off at bedtime 5) very low-salt diet advised 6) keep your legs/feet elevated as much as possible 7) weigh yourself daily,  call if you gain more than 3 pounds in 1 day or more than 5 pounds in 1 week as the Lasix may have to be adjusted 8) follow-up with general surgeon for recheck as advised   Please arrive at least 30 min before your appointment to complete your check in paperwork.  If you are unable to arrive 30 min prior to your appointment time we may have to cancel or reschedule you.  LAPAROSCOPIC SURGERY: POST OP INSTRUCTIONS  1. DIET: Follow a light bland diet the first 24 hours after arrival home, such as soup, liquids, crackers, etc. Be sure to include lots of fluids daily. Avoid fast food or heavy meals as your are more likely to get nauseated. Eat a low fat the next few days after surgery.  2. Take your usually prescribed home medications unless otherwise directed. 3. PAIN CONTROL:  1. Pain is best controlled by a usual combination of three different methods TOGETHER:  1. Ice/Heat 2. Over the counter pain medication 3. Prescription pain medication 2. Most patients will experience some swelling and bruising around the incisions. Ice packs or heating pads (30-60 minutes up to 6 times a day) will help. Use ice for the first few days to help decrease swelling and bruising, then switch to heat to help relax tight/sore spots and speed recovery. Some people prefer to use ice alone, heat alone, alternating between ice & heat. Experiment to what works for  you. Swelling and bruising can take several weeks to resolve.  3. It is helpful to take an over-the-counter pain medication regularly for the first few weeks. Choose one of the following that works best for you:  1. Naproxen (Aleve, etc) Two 220mg  tabs twice a day 2. Ibuprofen (Advil, etc) Three 200mg  tabs four times a day (every meal & bedtime) 3. Acetaminophen (Tylenol, etc) 500-650mg  four times a day (every meal & bedtime) 4. A prescription for pain medication (such as oxycodone, hydrocodone, etc) should be given to you upon discharge. Take your pain medication as prescribed.  1. If you are having problems/concerns with the prescription medicine (does not control pain, nausea, vomiting, rash, itching, etc), please call us [phone] to see if we need to switch you to a different pain medicine that will work better for you and/or control your side effect better. 2. If you need a refill on your pain medication, please contact your pharmacy. They will contact our office to request authorization. Prescriptions will not be filled after 5 pm or on week-ends. 4. Avoid getting constipated. Between the surgery and the pain medications, it is common to experience some constipation. Increasing fluid intake and taking a fiber supplement (such as Metamucil, Citrucel, FiberCon, MiraLax, etc) 1-2 times a day regularly will usually help prevent this problem from occurring. A mild laxative (prune juice, Milk of Magnesia, MiraLax, etc) should be taken according to package directions if there are no bowel movements after 48 hours.  5. Watch out for diarrhea. If you have many loose bowel movements,  simplify your diet to bland foods & liquids for a few days. Stop any stool softeners and decrease your fiber supplement. Switching to mild anti-diarrheal medications (Kayopectate, Pepto Bismol) can help. If this worsens or does not improve, please call us. 6. Wash / shower every day. You may shower over the dressings as they  are waterproof. Continue to shower over incision(s) after the dressing is off. 7. Remove your waterproof bandages 5 days after surgery. You may leave the incision open to air. You may replace a dressing/Band-Aid to cover the incision for comfort if you wish.  8. ACTIVITIES as tolerated:  1. You may resume regular (light) daily activities beginning the next day--such as daily self-care, walking, climbing stairs--gradually increasing activities as tolerated. If you can walk 30 minutes without difficulty, it is safe to try more intense activity such as jogging, treadmill, bicycling, low-impact aerobics, swimming, etc. 2. Save the most intensive and strenuous activity for last such as sit-ups, heavy lifting, contact sports, etc Refrain from any heavy lifting or straining until you are off narcotics for pain control.  3. DO NOT PUSH THROUGH PAIN. Let pain be your guide: If it hurts to do something, don't do it. Pain is your body warning you to avoid that activity for another week until the pain goes down. 4. You may drive when you are no longer taking prescription pain medication, you can comfortably wear a seatbelt, and you can safely maneuver your car and apply brakes. 5. You may have sexual intercourse when it is comfortable.  9. FOLLOW UP in our office  1. Please call [facility] at [phone] to set up an appointment to see your surgeon in the office for a follow-up appointment approximately 2-3 weeks after your surgery. 2. Make sure that you call for this appointment the day you arrive home to insure a convenient appointment time.      10. IF YOU HAVE DISABILITY OR FAMILY LEAVE FORMS, BRING THEM TO THE               OFFICE FOR PROCESSING.   WHEN TO CALL us [phone]:  1. Poor pain control 2. Reactions / problems with new medications (rash/itching, nausea, etc)  3. Fever over 101.5 F (38.5 C) 4. Inability to urinate 5. Nausea and/or vomiting 6. Worsening swelling or bruising 7. Continued  bleeding from incision. 8. Increased pain, redness, or drainage from the incision  The clinic staff is available to answer your questions during regular business hours (8:30am-5pm). Please dont hesitate to call and ask to speak to one of our nurses for clinical concerns.  If you have a medical emergency, go to the nearest emergency room or call 911.  A surgeon from [facility] is always on call at the [facility], [place], [place], [place], [place] [address] ?  MAIN: [phone] ? TOLL FREE: [phone] ?  FAX [phone]  [email or web address]   1) recheck PT/INR, BMP and CBC on Friday, 03/08/2018 2) use oxygen via nasal cannula at 2 L continuously as advised 3)Avoid ibuprofen/Advil/Aleve/Motrin/Goody Powders/Naproxen/BC powders/Meloxicam/Diclofenac/Indomethacin and other Nonsteroidal anti-inflammatory medications as these will make you more likely to bleed and can cause stomach ulcers, can also cause Kidney problems.  4)Use TEDs stockings, put them on in the morning and take them off at bedtime 5) very low-salt diet advised 6) keep your legs/feet elevated as much as possible 7) weigh yourself daily,  call if you gain more than  3 pounds in 1 day or more than 5 pounds in 1 week as the Lasix may have to be adjusted 8) follow-up with general surgeon for recheck as advised

## 2018-03-05 NOTE — Discharge Summary (Signed)
[name], is a 77 y.o. female  DOB 1941-07-17  MRN [id number].  Admission date:  02/21/2018  Admitting Physician  Ivor Costa, MD  Discharge Date:  03/05/2018   Primary MD  [name], [name], MD  Recommendations for primary care physician for things to follow:   1) recheck PT/INR, BMP and CBC on Friday, 03/08/2018 2) use oxygen via nasal cannula at 2 L continuously as advised 3)Avoid ibuprofen/Advil/Aleve/Motrin/Goody Powders/Naproxen/BC powders/Meloxicam/Diclofenac/Indomethacin and other Nonsteroidal anti-inflammatory medications as these will make you more likely to bleed and can cause stomach ulcers, can also cause Kidney problems.  4)Use TEDs stockings, put them on in the morning and take them off at bedtime 5) very low-salt diet advised 6) keep your legs/feet elevated as much as possible 7) weigh yourself daily,  call if you gain more than 3 pounds in 1 day or more than 5 pounds in 1 week as the Lasix may have to be adjusted 8) follow-up with general surgeon for recheck as advised   Admission Diagnosis     Discharge Diagnosis    Principal Problem:   Cholecystitis Active Problems:   Hypertension   Hypercholesteremia   Mechanical heart valve present   Chronic anticoagulation   Coronary artery disease   Diabetes mellitus without complication (HCC)   Atrial fibrillation (HCC)   Cholelithiasis   GERD (gastroesophageal reflux disease)   Anxiety   Chronic diastolic CHF (congestive heart failure) (HCC)   Diarrhea   Rash   UTI (urinary tract infection)      Past Medical History:  Diagnosis Date  . Aortic atherosclerosis ([place]) 01/23/2018  . Atrial fibrillation ([place])   . Carotid artery disease ([place])   . Cholelithiasis 01/23/2018  . Chronic anticoagulation   . Coronary artery disease    status post coronary artery bypass grafting times 07/10/2004  . Diabetes mellitus   .  Hypercholesteremia   . Hypertension   . Mechanical heart valve present    H. aortic valve replacement at the time of bypass surgery October 2005  . Peripheral arterial disease (HCC)    history of left common iliac artery PTA and stenting for a chronic total occlusion 08/26/01    Past Surgical History:  Procedure Laterality Date  . AORTIC VALVE REPLACEMENT    . CARDIAC CATHETERIZATION  11/10/2004   40% right common illiac, 70% in stent restenosis of distal left common illiac,   . CARDIAC CATHETERIZATION  05/18/2004   LAD 50-70% midstenosis, RCA dominant w/50% stenosis, 50% Right common Illiac artery ostial stenosis, 90% in stent restenosis within midportion of left common illiac stent  . Carotid Duplex  03/12/2012   RSA-elev. velocities suggestive of a 50-69% diameter reduction, Right&Left Bulb/Prox ICA-mild-mod.fibrous plaqueelevating Velocities abnormal study.  . CHOLECYSTECTOMY N/A 03/01/2018   Procedure: LAPAROSCOPIC CHOLECYSTECTOMY;  Surgeon: Judeth Horn, MD;  Location: [facility];  Service: General;  Laterality: N/A;  . COLONOSCOPY WITH PROPOFOL N/A 01/22/2018   Procedure: COLONOSCOPY WITH PROPOFOL;  Surgeon: Wilford Corner, MD;  Location: [facility];  Service: Endoscopy;  Laterality: N/A;  . ESOPHAGOGASTRODUODENOSCOPY (EGD) WITH PROPOFOL N/A 01/22/2018   Procedure: ESOPHAGOGASTRODUODENOSCOPY (EGD) WITH PROPOFOL;  Surgeon: Wilford Corner, MD;  Location: [facility];  Service: Endoscopy;  Laterality: N/A;  . Lower Ext. Duplex  03/12/2012   Right Proximal CIA- vessel narrowing w/elevated velocities 0-49% diameter reduction. Right SFA-mild mixed density plaque throughout vessel.  [name] NM MYOCAR PERF WALL MOTION  05/19/2010   protocol: Persantine, post stress EF 65%, negative for ischemia, low risk scan  . TRANSTHORACIC ECHOCARDIOGRAM  08/29/2012   Moderately calcified annulus of mitral valve, moderate regurg. of both mitral valve and tricuspid valve.        HPI  from the history and  physical done on the day of admission:   Chief Complaint: Abdominal pain, nausea, vomiting, diarrhea   HPI: [name] is a 77 y.o. female with medical history significant of hypertension, hyperlipidemia, diabetes mellitus, GERD, atrial fibrillation, AVR with mechanical valve on Coumadin, PVD, carotid artery stenosis, dCHF, CAD, GI bleeding, anxiety, who presents with abdominal pain, nausea vomiting and diarrhea.  Patient states that she has been having abdominal pain due to gallbladder problem for 3 months.  She has intermittent nausea, vomiting and abdominal pain, which has worsened since last night.  Abdominal pain is located in the right upper quadrant, constant, moderate, sharp, nonradiating.  She has subjective fever and chills.  She also has nausea and vomiting, she vomited once today without blood in the vomitus. She underwent HIDA scan on Friday 1 week ago with Dr. Alessandra Bevels andwas told that she had a "collapsed gallbladder that was nonfunctional ".She is scheduled to see [facility] surgery tomorrow for discussion of possible cholecystectomy.  Pt states that she has been having watery diarrhea since last Friday.  She 2-4 times of watery diarrhea each day. She was given prescription of Flagyl for possible c diff colitis by her doctor on 5/14. She has had 4 doses of the Flagyl, but she she has to stop taking Flagyl since she developed rashes all over her body. Patient denies any chest pain, shortness breath, cough, symptoms of UTI or unilateral weakness.  ED Course: pt was found to have WBC 4.7, lipase of 47, negative FOBT, electrolytes renal function okay, tachycardia, tachypnea, oxygen saturation 88 to 93% on room air, temperature 99.  CT abdomen/pelvis showed cholelithiasis with pericholecystic fluid.  Patient is admitted to telemetry bed as inpatient.  General surgeon, Dr. Hulen Skains was consulted.    Hospital Course:     Brief Narrative: Patient is a 51 -year-old female  with past medical history significant for hypertension, hyperlipidemia, diabetes mellitus, GERD, atrial fibrillation, atrial valve replacement with mechanical valve on Coumadin, peripheral vascular disease, carotid artery stenosis, diastolic CHF, CAD, GI bleed who presents to the emergency department with complaints of abdominal pain, nausea and vomiting.   She was reporting to have abdominal pain on and off for last 3 months with intermittent nausea, vomiting.  Patient was also reported to have diarrhea. CT abdomen/pelvis done here showed cholelithiasis with pericholecystatic fluid.  Ultrasound done here was positive  for cholelithiasis/cholecystitis.  Was treated with antibiotics.  S/p lap cholecystectomy on 03/01/18.  IV heparin restarted on 03/01/2018 after 3 PM,  Coumadin was restarted on 03/02/2018 , patient complains of nausea, shortness of breath and lower extremity edema, chest x-ray from 03/04/2018 with congestive heart failure findings, patient was diuresed, improved significantly will be discharged home on Lasix 60 mg daily   Assessment & Plan:   1)Cholecystitis/cholelithiasis:  S/p Lap cholecystectomy on 03/01/18, per surgical team okay for discharge home from a surgical standpoint with outpatient follow-up ,    2)Mechanical Aortic valve: Status post aortic valve replacement.  Was on Coumadin at home.  INR was supratherapeutic , she received vitamin K, Per surgical team okay to resume IV heparin without bolus at 3 PM on 03/01/2018, , mechanical valve prosthesis functioning normally on echo,    Coumadin was restarted on 03/02/2018, INR 2.3, goal is 2.5 to 3.5, treated with IV heparin, and Coumadin started 03/02/2018,  INR is now therapeutic,  3)Diarrhea: C. difficile suspected. Diarrhea has stopped. Negative  PCR for C. difficile and GI pathogen panel.  4) iron deficiency anemia anemia: Hemoglobin stable,. FOBT negative.  Anemia  Panel shows low serum iron of 9 and iron saturation of 3,.   SS/P  2 units of PRBC .  Continue iron supplementation  5)Hypertension: Improved BP control, continue metoprolol 50 mg daily, hydralazine 100 mg 3 times daily, and irbesatan 300 mg ,    6)H/o CAD-asymptomatic at this time, continue metoprolol, Lipitor and aspirin  7)Diabetes mellitus: Last hemoglobin A1c was 6.4 on 09/19/17.  Patient was on metformin and glipizide at home.  Restarted metformin,    8)History of atrial fibrillation:CHA2DS2-VASc Scoreis 7. Patient is on Coumadinat home.  , continue metoprolol for rate control.  Coumadin was restarted on 03/02/2018, recheck INR as advised as outpatient  9)HFpEF/Chronic diastolic CHF/underlying pulmonary hypertension --- patient diagnosed this admission with acute on chronic diastolic dysfunction exacerbation, at this time much improved with iv diuresis, will discharge home on p.o. Lasix, give TED stockings, last 2D echo showed ejection fraction of 60 to 65%.  Repeat echo echocardiogram done here showed ejection fraction of 55 to 73%, grade 3 diastolic dysfunction, functioning mechanical aortic valve.  pulmonary hypertension, ,   10) acute hypoxic respiratory failure-secondary to CHF, continue incentive spirometry in the postop patient, patient meets criteria for home O2, discussed with case manager, plan to discharge home on home O2, continue diuresis as above #9 ,   Code Status: Full code Family Communication: Son and daughter-in-law at bedside, questions answered  Consultants: Cardiology, general surgery  Procedures: Lap chole 03/01/2018  Antimicrobials: Ceftriaxone since 02/21/18, switched to IV Unasyn preop, stopped post op   Discharge Condition: stable , volume status much improved after IV diuresis  Follow UP  [place] up.   Specialty:  Home Health Services Why:  Physical Therapy Contact information: [address] [place]  [address] [phone]        Surgery, [place]. Go on 03/19/2018.   Specialty:  General Surgery Why:  Your appointment is scheduled for 9:00 AM. Please arrive 30 min prior to appointment time. Bring photo ID and insurance information.  Contact information: [address] [place] [address] [phone]           Diet and Activity recommendation:  As advised  Discharge Instructions     Discharge Instructions    (HEART FAILURE PATIENTS) Call MD:  Anytime you have any of the following symptoms: 1) 3 pound weight gain in 24 hours or 5 pounds in 1 week 2) shortness of breath, with or without a dry hacking cough 3) swelling in the hands, feet or stomach 4) if you have to sleep on extra pillows at night in order to breathe.   Complete by:  As directed    AMB  Referral to [facility]   Complete by:  As directed    Please assign to [facility] for DM, CHF. PCP office ([facility] is listed as doing toc). Currently in [facility]. Written consent obtained. Unplanned readmission risk score high 24%. Please call with questions. Thanks. [name], [place], RN,BSN-[facility] Liaison-[phone]   Reason for consult:  Please assign to [facility] RNCM   Diagnoses of:   Diabetes Heart Failure     Expected date of contact:  1-3 days (reserved for hospital discharges)   Call MD for:  difficulty breathing, headache or visual disturbances   Complete by:  As directed    Call MD for:  persistant dizziness or light-headedness   Complete by:  As directed    Call MD for:  persistant nausea and vomiting   Complete by:  As directed    Call MD for:  redness, tenderness, or signs of infection (pain, swelling, redness, odor or green/yellow discharge around incision site)   Complete by:  As directed    Call MD for:  severe uncontrolled pain   Complete by:  As directed    Call MD for:  temperature >100.4   Complete by:  As directed    Diet - low  sodium heart healthy   Complete by:  As directed    Diet Carb Modified   Complete by:  As directed    Discharge instructions   Complete by:  As directed    1) recheck PT/INR, BMP and CBC on Friday, 03/08/2018 2) use oxygen via nasal cannula at 2 L continuously as advised 3)Avoid ibuprofen/Advil/Aleve/Motrin/Goody Powders/Naproxen/BC powders/Meloxicam/Diclofenac/Indomethacin and other Nonsteroidal anti-inflammatory medications as these will make you more likely to bleed and can cause stomach ulcers, can also cause Kidney problems.  4)Use TEDs stockings, put them on in the morning and take them off at bedtime 5) very low-salt diet advised 6) keep your legs/feet elevated as much as possible 7) weigh yourself daily,  call if you gain more than 3 pounds in 1 day or more than 5 pounds in 1 week as the Lasix may have to be adjusted 8) follow-up with general surgeon for recheck as advised   Increase activity slowly   Complete by:  As directed        Discharge Medications     Allergies as of 03/05/2018      Reactions   Flagyl [metronidazole] Rash   ALL-OVER BODY RASH   Coreg [carvedilol] Other (See Comments)   Terrible cramping in the feet and had a lot of bowel movements, but not diarrhea   Losartan Swelling   Patient doesn't recall site of swelling   Spironolactone Nausea Only   Caused extreme nausea   Verapamil Hives   Zetia [ezetimibe] Other (See Comments)   Reaction not recalled   Zocor [simvastatin - High Dose] Other (See Comments)   Reaction not recalled      Medication List    STOP taking these medications   hydrALAZINE 100 MG tablet Commonly known as:  APRESOLINE   metroNIDAZOLE 500 MG tablet Commonly known as:  FLAGYL     TAKE these medications   acetaminophen 325 MG tablet Commonly known as:  TYLENOL Take 2 tablets (650 mg total) by mouth every 6 (six) hours as needed for mild pain, fever or headache (for pain or headaches). What changed:    how much to  take  reasons to take this   ALPRAZolam 0.5 MG tablet Commonly  known as:  XANAX Take 0.25-0.5 mg by mouth See admin instructions. Take 0.25-0.5 mg two times a day as needed for anxiety and 0.25-0.5 mg at bedtime scheduled   aspirin EC 81 MG tablet Take 81 mg by mouth every Monday, Wednesday, and Friday.   atorvastatin 80 MG tablet Commonly known as:  LIPITOR Take 1 tablet (80 mg total) by mouth daily. What changed:  when to take this   ferrous sulfate 325 (65 FE) MG tablet Take 1 tablet (325 mg total) by mouth 2 (two) times daily with a meal.   furosemide 20 MG tablet Commonly known as:  LASIX Take 3 tablets (60 mg total) by mouth daily.   glipiZIDE 2.5 MG 24 hr tablet Commonly known as:  GLUCOTROL XL Take 2.5 mg by mouth daily.   hydrocortisone cream 1 % Apply topically 2 (two) times daily.   irbesartan 300 MG tablet Commonly known as:  AVAPRO Take 1 tablet (300 mg total) by mouth daily.   magnesium oxide 400 (241.3 Mg) MG tablet Commonly known as:  MAG-OX Take 1 tablet (400 mg total) by mouth daily.   metFORMIN 500 MG tablet Commonly known as:  GLUCOPHAGE Take 500 mg by mouth 2 (two) times daily with a meal.   metoprolol tartrate 50 MG tablet Commonly known as:  LOPRESSOR Take 50 mg by mouth 2 (two) times daily.   ondansetron 4 MG disintegrating tablet Commonly known as:  ZOFRAN ODT 4mg  ODT q6-8 hours prn nausea/vomit What changed:    how much to take  how to take this  when to take this  reasons to take this  additional instructions   pantoprazole 40 MG tablet Commonly known as:  PROTONIX Take 40 mg by mouth daily.   potassium chloride SA 20 MEQ tablet Commonly known as:  K-DUR,KLOR-CON Take 1 tablet (20 mEq total) by mouth daily.   saccharomyces boulardii 250 MG capsule Commonly known as:  FLORASTOR Take 1 capsule (250 mg total) by mouth 2 (two) times daily.   SYSTANE PRESERVATIVE FREE 0.4-0.3 % Soln Generic drug:  Polyethyl Glyc-Propyl  Glyc PF Place 1-2 drops into both eyes 3 (three) times daily as needed (for irritation).   traMADol 50 MG tablet Commonly known as:  ULTRAM Take 1 tablet (50 mg total) by mouth every 6 (six) hours as needed for moderate pain. What changed:  reasons to take this   vitamin B-12 100 MCG tablet Commonly known as:  CYANOCOBALAMIN Take 100 mcg by mouth daily.   Vitamin D3 1000 units Caps Take 1,000 Units by mouth daily.   warfarin 5 MG tablet Commonly known as:  COUMADIN Take as directed. If you are unsure how to take this medication, talk to your nurse or doctor. Original instructions:  TAKE 1 TO 1.5 (ONE TO ONE & ONE-HALF) TABLETS BY MOUTH DAILY AS DIRECTED BY COUMADIN CLINIC What changed:  See the new instructions.            Durable Medical Equipment  (From admission, onward)        Start     Ordered   03/04/18 0947  For home use only DME oxygen  Once    Comments:  Oxygen via nasal cannula continuously at 2 L/min with gaseous portability and conserving device  Diagnosis hypoxic respiratory failure secondary to underlying congestive heart failure  O2 sats at rest 86 to 87% on room air, with minimum activity O2 sats 85% on room air O2 sats up to 93% on 2 L  of oxygen via nasal cannula  Question Answer Comment  Mode or (Route) Nasal cannula   Liters per Minute 2   Frequency Continuous (stationary and portable oxygen unit needed)   Oxygen conserving device Yes   Oxygen delivery system Gas      03/04/18 0947      Major procedures and Radiology Reports - PLEASE review detailed and final reports for all details, in brief -    Dg Chest 2 View  Result Date: 03/04/2018 CLINICAL DATA:  Chest pain and SOB for a few days EXAM: CHEST - 2 VIEW COMPARISON:  02/28/2018 FINDINGS: Stable changes from prior cardiac surgery. Cardiac silhouette is mildly enlarged. No mediastinal or hilar masses. No evidence of adenopathy. Small left pleural effusion. There is bilateral interstitial  thickening and vascular prominence, which is similar to the prior exam. Stable granuloma are noted in the right mid lung and left lung base. Streaky opacity at the left lung base is consistent with atelectasis, stable from the most recent prior study. Skeletal structures are demineralized but grossly intact. IMPRESSION: 1. Findings suggest mild acute on chronic congestive heart failure similar to the prior exam, with vascular congestion and interstitial thickening as well as a small left pleural effusion and cardiomegaly. Electronically Signed   By: Lajean Manes M.D.   On: 03/04/2018 11:43   Nm Hepatobiliary Including Gb  Result Date: 02/15/2018 CLINICAL DATA:  Right upper quadrant pain. Non intractable vomiting with nausea. EXAM: NUCLEAR MEDICINE HEPATOBILIARY IMAGING TECHNIQUE: Sequential images of the abdomen were obtained out to 60 minutes following intravenous administration of radiopharmaceutical. RADIOPHARMACEUTICALS:  6.9 mCi Tc-42m Choletec IV (this includes a booster dose) COMPARISON:  Abdominal ultrasound 01/17/2018 FINDINGS: Prompt uptake and biliary excretion of activity by the liver is seen. Prompt biliary tree and bowel activity was seen. Gallbladder activity was not seen at 90 minutes such that morphine was administered to ensure cystic duct patency. As such, ejection fraction could not be calculated. IMPRESSION: 1. Delayed gallbladder filling such that morphine had to be administered at 90 minutes with subsequent confirmation of cystic duct patency. As such, gallbladder ejection fraction could not be assessed. 2. Patent common bile duct. Electronically Signed   By: Monte Fantasia M.D.   On: 02/15/2018 16:53   Ct Abdomen Pelvis W Contrast  Result Date: 02/21/2018 CLINICAL DATA:  Acute onset of diarrhea. Right upper quadrant abdominal pain. EXAM: CT ABDOMEN AND PELVIS WITH CONTRAST TECHNIQUE: Multidetector CT imaging of the abdomen and pelvis was performed using the standard protocol  following bolus administration of intravenous contrast. CONTRAST:  159mL OMNIPAQUE IOHEXOL 300 MG/ML  SOLN COMPARISON:  CT of the abdomen and pelvis performed 01/18/2017, and abdominal ultrasound performed 01/17/2018 FINDINGS: Lower chest: Small bilateral pleural effusions are noted, with mild bibasilar atelectasis. A calcified granuloma is noted at the left lung base. The heart is enlarged. Scattered coronary artery calcifications are seen. Hepatobiliary: The liver is grossly unremarkable in appearance. Stones are noted within the gallbladder. There is question of mild pericholecystic fluid. The common bile duct is normal in caliber. Pancreas: The pancreas is within normal limits. Spleen: The spleen is enlarged, measuring 15.2 cm in length. Adrenals/Urinary Tract: The adrenal glands are unremarkable in appearance. The kidneys are within normal limits. There is no evidence of hydronephrosis. No renal or ureteral stones are identified. No perinephric stranding is seen. Stomach/Bowel: The stomach is unremarkable in appearance. The small bowel is within normal limits. The appendix is not visualized; there is no evidence for appendicitis. Scattered  diverticulosis is noted along the sigmoid colon, without evidence of diverticulitis. Vascular/Lymphatic: Diffuse calcification is seen along the abdominal aorta and its branches. The abdominal aorta is otherwise grossly unremarkable. The inferior vena cava is grossly unremarkable. No retroperitoneal lymphadenopathy is seen. No pelvic sidewall lymphadenopathy is identified. Reproductive: The bladder is moderately distended and contains a small amount of air, possibly reflecting recent Foley catheter placement. The patient is status post hysterectomy. No suspicious adnexal masses are seen. Other: No additional soft tissue abnormalities are seen. Musculoskeletal: No acute osseous abnormalities are identified. The visualized musculature is unremarkable in appearance. IMPRESSION:  1. Cholelithiasis, with question of mild pericholecystic fluid. Right upper quadrant ultrasound could be considered to assess for cholecystitis. 2. Small bilateral pleural effusions, with mild bibasilar atelectasis. 3. Splenomegaly. 4. Scattered coronary artery calcifications seen.  Cardiomegaly. 5. Scattered diverticulosis along the sigmoid colon, without evidence of diverticulitis. Aortic Atherosclerosis (ICD10-I70.0). Electronically Signed   By: Garald Balding M.D.   On: 02/21/2018 21:18   Dg Chest Port 1 View  Result Date: 03/01/2018 CLINICAL DATA:  Shortness of breath. EXAM: PORTABLE CHEST 1 VIEW COMPARISON:  Chest radiograph February 01, 2017 FINDINGS: Stable cardiomegaly. Status post median sternotomy for CABG. Coronary artery calcification and/or stent. Calcified aortic arch. Scattered calcified granulomas. Small LEFT pleural effusion. Interstitial prominence with mild bronchial wall thickening. No pneumothorax. Calcifications in the breast. Osteopenia. IMPRESSION: Stable cardiomegaly. Interstitial prominence slightly increased from prior examination most consistent with acute on chronic pulmonary edema. Increased small LEFT pleural effusion. Electronically Signed   By: Elon Alas M.D.   On: 03/01/2018 00:05   US Abdomen Limited Ruq  Result Date: 02/22/2018 CLINICAL DATA:  Right upper quadrant pain with nausea and vomiting EXAM: ULTRASOUND ABDOMEN LIMITED RIGHT UPPER QUADRANT COMPARISON:  CT abdomen and pelvis Feb 21, 2018 FINDINGS: Gallbladder: Within the gallbladder, there are echogenic foci which move and shadow consistent with cholelithiasis. Largest gallstone measures 2.1 cm in length. Gallbladder wall appears thickened and edematous. There is trace pericholecystic fluid. No sonographic Murphy sign noted by sonographer. Common bile duct: Diameter: 5 mm. No intrahepatic or extrahepatic biliary duct dilatation. Liver: No focal lesion identified. Within normal limits in parenchymal  echogenicity. Portal vein is patent on color Doppler imaging with normal direction of blood flow towards the liver. There is a right pleural effusion. The right kidney is noted to be echogenic with normal renal cortical thickness. IMPRESSION: 1. Gallstones with gallbladder wall thickening and slight pericholecystic fluid. The appearance of the gallbladder is felt to be indicative of acute cholecystitis. 2. Echogenic right kidney, a finding concerning for medical renal disease. Appropriate laboratory correlation in this regard advised. 3.  Small right pleural effusion. Electronically Signed   By: Lowella Grip III M.D.   On: 02/22/2018 10:13    Micro Results    Recent Results (from the past 240 hour(s))  Surgical pcr screen     Status: None   Collection Time: 03/01/18  8:06 AM  Result Value Ref Range Status   MRSA, PCR NEGATIVE NEGATIVE Final   Staphylococcus aureus NEGATIVE NEGATIVE Final    Comment: (NOTE) The Xpert SA Assay (FDA approved for NASAL specimens in patients 27 years of age and older), is one component of a comprehensive surveillance program. It is not intended to diagnose infection nor to guide or monitor treatment. Performed at [facility], [place] [address] [place], [place] [address]        Today   Subjective    [name]  today has no complaints, eating and drinking well, no productive cough, no fevers, no chest pain, no shortness of breath at rest          Patient has been seen and examined prior to discharge   Objective   Blood pressure 136/69, pulse 96, temperature 98.7 F (37.1 C), temperature source Oral, resp. rate 18, height 5\' 1"  (1.549 m), weight 66.8 kg (147 lb 4.8 oz), SpO2 95 %.   Intake/Output Summary (Last 24 hours) at 03/05/2018 0943 Last data filed at 03/04/2018 1730 Gross per 24 hour  Intake 480 ml  Output 3 ml  Net 477 ml    Exam Gen:-   In no apparent distress, awake and alert  HEENT:- [place].AT, No sclera icterus Nose- [place] 2  L/min Neck-Supple Neck,No JVD,.  Lungs-improved air movement, no wheezing CV- S1, S2 normal, click of mechanical valve Abd-  +ve B.Sounds, Abd Soft, appropriate postop tenderness  extremity/Skin:-1+ edema,   +ve pulses Psych-affect is appropriate, oriented x3  neuro-no new focal deficits, no tremors     Data Review   CBC w Diff:  Lab Results  Component Value Date   WBC 4.1 03/05/2018   HGB 9.5 (L) 03/05/2018   HCT 31.8 (L) 03/05/2018   PLT 180 03/05/2018   LYMPHOPCT 13 02/24/2018   MONOPCT 4 02/24/2018   EOSPCT 4 02/24/2018   BASOPCT 1 02/24/2018    CMP:  Lab Results  Component Value Date   NA 133 (L) 03/05/2018   NA 137 12/06/2017   K 4.1 03/05/2018   CL 101 03/05/2018   CO2 25 03/05/2018   BUN 18 03/05/2018   BUN 20 12/06/2017   CREATININE 1.03 (H) 03/05/2018   PROT 5.8 (L) 03/02/2018   ALBUMIN 2.0 (L) 03/02/2018   BILITOT 1.0 03/02/2018   ALKPHOS 69 03/02/2018   AST 40 03/02/2018   ALT 15 03/02/2018  .   Total Discharge time is about 33 minutes  Roxan Hockey M.D on 03/05/2018 at 9:43 AM  [facility]   Office  [phone]  Voice Recognition Viviann Spare dictation system was used to create this note, attempts have been made to correct errors. Please contact the author with questions and/or clarifications.

## 2018-03-05 NOTE — Care Management (Signed)
1009 03-05-18 Pt did get 02 delivered from [facility]. [facility] is aware that patient will transition home today. No further needs from CM at this time. Bethena Roys, RN,BSN [phone]

## 2018-03-05 NOTE — Progress Notes (Signed)
Physical Therapy Treatment Patient Details Name: [name] MRN: [id number] DOB: 07/01/1941 Today's Date: 03/05/2018    History of Present Illness Pt is a 77 y/o female admitted secondary to nausea/vomiting. Found to have cholelithiasis and is s/p laparoscopic cholecystectomy. PMH includes a fib, CAD s/p stent placement and CABG, DM, HTN, PAD, and SVR.     PT Comments    Pt admitted with above diagnosis. Pt currently with functional limitations due to the deficits listed below (see PT Problem List). Pt was able to ambulate with supervision with RW with O2 at 2-4L to keep satss >90%.  REady for d/c today as pt is going home today.  Pt will benefit from skilled PT to increase their independence and safety with mobility to allow discharge to the venue listed below.    Follow Up Recommendations  Home health PT;Supervision for mobility/OOB     Equipment Recommendations  Other (comment)(tub bench )    Recommendations for Other Services OT consult     Precautions / Restrictions Precautions Precautions: Fall Precaution Comments: watch O2 sats  Restrictions Weight Bearing Restrictions: No    Mobility  Bed Mobility               General bed mobility comments: in chair  Transfers Overall transfer level: Needs assistance Equipment used: Rolling walker (2 wheeled) Transfers: Sit to/from Stand Sit to Stand: Supervision         General transfer comment: supervision for safety, no physical assist needed   Ambulation/Gait Ambulation/Gait assistance: Min guard;Supervision Ambulation Distance (Feet): 250 Feet Assistive device: Rolling walker (2 wheeled) Gait Pattern/deviations: Step-through pattern;Decreased stride length Gait velocity: Decreased  Gait velocity interpretation: <1.8 ft/sec, indicate of risk for recurrent falls General Gait Details: Slow, cautious gait. no unsteadiness noted. Required standing rest breaks secondary to fatigue. Needed O2 at 2L to keep sats  >90%   Stairs             Wheelchair Mobility    Modified Rankin (Stroke Patients Only)       Balance Overall balance assessment: Needs assistance Sitting-balance support: No upper extremity supported;Feet supported Sitting balance-Leahy Scale: Good     Standing balance support: No upper extremity supported;During functional activity Standing balance-Leahy Scale: Fair Standing balance comment: Able to maintain static standing during ADL tasks                             Cognition Arousal/Alertness: Awake/alert Behavior During Therapy: WFL for tasks assessed/performed Overall Cognitive Status: Within Functional Limits for tasks assessed                                        Exercises      General Comments General comments (skin integrity, edema, etc.): Son present and pt d/c today, asssited pt with techniques to place TED hose on      Pertinent Vitals/Pain Pain Assessment: No/denies pain Faces Pain Scale: Hurts a little bit Pain Location: back  Pain Descriptors / Indicators: Sore Pain Intervention(s): Monitored during session;Limited activity within patient's tolerance;Repositioned    Home Living                      Prior Function            PT Goals (current goals can now be found in the care plan section) Acute  Rehab PT Goals Patient Stated Goal: to go home  Progress towards PT goals: Progressing toward goals    Frequency    Min 3X/week      PT Plan      Co-evaluation              AM-PAC PT "6 Clicks" Daily Activity  Outcome Measure  Difficulty turning over in bed (including adjusting bedclothes, sheets and blankets)?: A Little Difficulty moving from lying on back to sitting on the side of the bed? : A Little Difficulty sitting down on and standing up from a chair with arms (e.g., wheelchair, bedside commode, etc,.)?: A Little Help needed moving to and from a bed to chair (including a  wheelchair)?: A Little Help needed walking in hospital room?: A Little Help needed climbing 3-5 steps with a railing? : A Lot 6 Click Score: 17    End of Session Equipment Utilized During Treatment: Gait belt;Oxygen Activity Tolerance: Patient tolerated treatment well Patient left: in chair;with call bell/phone within reach;with family/visitor present Nurse Communication: Mobility status;Other (comment)(pt with BM) PT Visit Diagnosis: Unsteadiness on feet (R26.81);Muscle weakness (generalized) (M62.81)     Time: 1610-9604 PT Time Calculation (min) (ACUTE ONLY): 19 min  Charges:  $Gait Training: 8-22 mins                    G Codes:       Dellene Mcgroarty,PT Acute Rehabilitation [phone] (pager)    Denice Paradise 03/05/2018, 12:27 PM

## 2018-03-06 ENCOUNTER — Emergency Department (HOSPITAL_COMMUNITY): Payer: Medicare HMO

## 2018-03-06 ENCOUNTER — Other Ambulatory Visit: Payer: Self-pay

## 2018-03-06 ENCOUNTER — Inpatient Hospital Stay (HOSPITAL_COMMUNITY)
Admission: EM | Admit: 2018-03-06 | Discharge: 2018-03-13 | DRG: 291 | Disposition: A | Discharge status: Home / Self Care | Payer: Medicare HMO | Attending: Family Medicine | Admitting: Family Medicine

## 2018-03-06 ENCOUNTER — Encounter (HOSPITAL_COMMUNITY): Payer: Self-pay | Admitting: Emergency Medicine

## 2018-03-06 DIAGNOSIS — E785 Hyperlipidemia, unspecified: Secondary | ICD-10-CM | POA: Diagnosis present

## 2018-03-06 DIAGNOSIS — Z888 Allergy status to other drugs, medicaments and biological substances status: Secondary | ICD-10-CM | POA: Diagnosis not present

## 2018-03-06 DIAGNOSIS — E7800 Pure hypercholesterolemia, unspecified: Secondary | ICD-10-CM | POA: Diagnosis present

## 2018-03-06 DIAGNOSIS — I272 Pulmonary hypertension, unspecified: Secondary | ICD-10-CM | POA: Diagnosis not present

## 2018-03-06 DIAGNOSIS — Z7982 Long term (current) use of aspirin: Secondary | ICD-10-CM

## 2018-03-06 DIAGNOSIS — Z87891 Personal history of nicotine dependence: Secondary | ICD-10-CM | POA: Diagnosis not present

## 2018-03-06 DIAGNOSIS — J9601 Acute respiratory failure with hypoxia: Secondary | ICD-10-CM | POA: Diagnosis present

## 2018-03-06 DIAGNOSIS — I5033 Acute on chronic diastolic (congestive) heart failure: Secondary | ICD-10-CM

## 2018-03-06 DIAGNOSIS — R0602 Shortness of breath: Secondary | ICD-10-CM | POA: Diagnosis not present

## 2018-03-06 DIAGNOSIS — F419 Anxiety disorder, unspecified: Secondary | ICD-10-CM | POA: Diagnosis present

## 2018-03-06 DIAGNOSIS — E440 Moderate protein-calorie malnutrition: Secondary | ICD-10-CM

## 2018-03-06 DIAGNOSIS — Z951 Presence of aortocoronary bypass graft: Secondary | ICD-10-CM | POA: Diagnosis not present

## 2018-03-06 DIAGNOSIS — I10 Essential (primary) hypertension: Secondary | ICD-10-CM | POA: Diagnosis not present

## 2018-03-06 DIAGNOSIS — E1151 Type 2 diabetes mellitus with diabetic peripheral angiopathy without gangrene: Secondary | ICD-10-CM | POA: Diagnosis present

## 2018-03-06 DIAGNOSIS — R079 Chest pain, unspecified: Secondary | ICD-10-CM | POA: Diagnosis not present

## 2018-03-06 DIAGNOSIS — Z9049 Acquired absence of other specified parts of digestive tract: Secondary | ICD-10-CM | POA: Diagnosis not present

## 2018-03-06 DIAGNOSIS — Z7901 Long term (current) use of anticoagulants: Secondary | ICD-10-CM

## 2018-03-06 DIAGNOSIS — I482 Chronic atrial fibrillation, unspecified: Secondary | ICD-10-CM | POA: Diagnosis present

## 2018-03-06 DIAGNOSIS — E871 Hypo-osmolality and hyponatremia: Secondary | ICD-10-CM | POA: Diagnosis present

## 2018-03-06 DIAGNOSIS — I2510 Atherosclerotic heart disease of native coronary artery without angina pectoris: Secondary | ICD-10-CM | POA: Diagnosis present

## 2018-03-06 DIAGNOSIS — I700 Atherosclerosis of aorta: Secondary | ICD-10-CM | POA: Diagnosis present

## 2018-03-06 DIAGNOSIS — Z883 Allergy status to other anti-infective agents status: Secondary | ICD-10-CM

## 2018-03-06 DIAGNOSIS — K5730 Diverticulosis of large intestine without perforation or abscess without bleeding: Secondary | ICD-10-CM | POA: Diagnosis present

## 2018-03-06 DIAGNOSIS — I445 Left posterior fascicular block: Secondary | ICD-10-CM | POA: Diagnosis not present

## 2018-03-06 DIAGNOSIS — K219 Gastro-esophageal reflux disease without esophagitis: Secondary | ICD-10-CM | POA: Diagnosis present

## 2018-03-06 DIAGNOSIS — Z7984 Long term (current) use of oral hypoglycemic drugs: Secondary | ICD-10-CM

## 2018-03-06 DIAGNOSIS — J9621 Acute and chronic respiratory failure with hypoxia: Secondary | ICD-10-CM | POA: Diagnosis present

## 2018-03-06 DIAGNOSIS — I110 Hypertensive heart disease with heart failure: Principal | ICD-10-CM | POA: Diagnosis present

## 2018-03-06 DIAGNOSIS — Z952 Presence of prosthetic heart valve: Secondary | ICD-10-CM

## 2018-03-06 DIAGNOSIS — M7989 Other specified soft tissue disorders: Secondary | ICD-10-CM | POA: Diagnosis not present

## 2018-03-06 DIAGNOSIS — E119 Type 2 diabetes mellitus without complications: Secondary | ICD-10-CM

## 2018-03-06 DIAGNOSIS — L270 Generalized skin eruption due to drugs and medicaments taken internally: Secondary | ICD-10-CM | POA: Diagnosis not present

## 2018-03-06 DIAGNOSIS — T466X5A Adverse effect of antihyperlipidemic and antiarteriosclerotic drugs, initial encounter: Secondary | ICD-10-CM | POA: Diagnosis not present

## 2018-03-06 DIAGNOSIS — Z6823 Body mass index (BMI) 23.0-23.9, adult: Secondary | ICD-10-CM

## 2018-03-06 HISTORY — DX: Acquired absence of other specified parts of digestive tract: Z90.49

## 2018-03-06 HISTORY — DX: Pulmonary hypertension, unspecified: I27.20

## 2018-03-06 LAB — CBC
HCT: 31.9 % — ABNORMAL LOW (ref 36.0–46.0)
Hemoglobin: 9.7 g/dL — ABNORMAL LOW (ref 12.0–15.0)
MCH: 23 pg — AB (ref 26.0–34.0)
MCHC: 30.4 g/dL (ref 30.0–36.0)
MCV: 75.8 fL — AB (ref 78.0–100.0)
PLATELETS: 199 10*3/uL (ref 150–400)
RBC: 4.21 MIL/uL (ref 3.87–5.11)
RDW: 19.5 % — AB (ref 11.5–15.5)
WBC: 2.6 10*3/uL — ABNORMAL LOW (ref 4.0–10.5)

## 2018-03-06 LAB — TROPONIN I: Troponin I: <0.03 ng/mL (ref <0.03)

## 2018-03-06 LAB — I-STAT TROPONIN, ED: Troponin i, poc: 0.01 ng/mL (ref 0.00–0.08)

## 2018-03-06 LAB — BASIC METABOLIC PANEL
Anion gap: 7 (ref 5–15)
BUN: 15 mg/dL (ref 6–20)
CHLORIDE: 100 mmol/L — AB (ref 101–111)
CO2: 24 mmol/L (ref 22–32)
CREATININE: 0.91 mg/dL (ref 0.44–1.00)
Calcium: 8.4 mg/dL — ABNORMAL LOW (ref 8.9–10.3)
GFR calc Af Amer: >60 mL/min (ref >60)
GFR calc non Af Amer: 58 mL/min — ABNORMAL LOW (ref >60)
GLUCOSE: 132 mg/dL — AB (ref 65–99)
Potassium: 3.9 mmol/L (ref 3.5–5.1)
Sodium: 131 mmol/L — ABNORMAL LOW (ref 135–145)

## 2018-03-06 LAB — PROTIME-INR
INR: 4.58 — AB
Prothrombin Time: 43 seconds — ABNORMAL HIGH (ref 11.4–15.2)

## 2018-03-06 LAB — CBG MONITORING, ED: Glucose-Capillary: 106 mg/dL — ABNORMAL HIGH (ref 65–99)

## 2018-03-06 LAB — BRAIN NATRIURETIC PEPTIDE: B Natriuretic Peptide: 790.8 pg/mL — ABNORMAL HIGH (ref 0.0–100.0)

## 2018-03-06 LAB — GLUCOSE, CAPILLARY: Glucose-Capillary: 95 mg/dL (ref 65–99)

## 2018-03-06 MED ORDER — INSULIN ASPART 100 UNIT/ML ~~LOC~~ SOLN: 0.0000 [IU] | Freq: Every day | SUBCUTANEOUS | Status: DC

## 2018-03-06 MED ORDER — SODIUM CHLORIDE 0.9% FLUSH: 3.0000 mL | INTRAVENOUS | Status: DC | PRN

## 2018-03-06 MED ORDER — PANTOPRAZOLE SODIUM 40 MG PO TBEC
40.0000 mg | DELAYED_RELEASE_TABLET | Freq: Every day | ORAL | Status: DC
  Administered 2018-03-06 – 2018-03-12 (×7): 40 mg via ORAL
  Filled 2018-03-06 (×8): qty 1

## 2018-03-06 MED ORDER — MAGNESIUM OXIDE 400 (241.3 MG) MG PO TABS
400.0000 mg | ORAL_TABLET | Freq: Every day | ORAL | Status: DC
  Administered 2018-03-06 – 2018-03-13 (×8): 400 mg via ORAL
  Filled 2018-03-06 (×8): qty 1

## 2018-03-06 MED ORDER — WARFARIN - PHARMACIST DOSING INPATIENT
Freq: Every day | Status: DC
  Administered 2018-03-12 (×2)

## 2018-03-06 MED ORDER — FUROSEMIDE 10 MG/ML IJ SOLN
60.0000 mg | Freq: Once | INTRAMUSCULAR | Status: AC
  Administered 2018-03-06: 60 mg via INTRAVENOUS
  Filled 2018-03-06: qty 6

## 2018-03-06 MED ORDER — SODIUM CHLORIDE 0.9 % IV SOLN: 250.0000 mL | INTRAVENOUS | Status: DC | PRN

## 2018-03-06 MED ORDER — SACCHAROMYCES BOULARDII 250 MG PO CAPS
250.0000 mg | ORAL_CAPSULE | Freq: Two times a day (BID) | ORAL | Status: DC
  Administered 2018-03-06 – 2018-03-12 (×12): 250 mg via ORAL
  Filled 2018-03-06 (×12): qty 1

## 2018-03-06 MED ORDER — ASPIRIN EC 81 MG PO TBEC
81.0000 mg | DELAYED_RELEASE_TABLET | ORAL | Status: DC
  Administered 2018-03-06 – 2018-03-13 (×4): 81 mg via ORAL
  Filled 2018-03-06 (×6): qty 1

## 2018-03-06 MED ORDER — GLIPIZIDE ER 2.5 MG PO TB24: 2.5000 mg | ORAL_TABLET | Freq: Every day | ORAL | Status: DC

## 2018-03-06 MED ORDER — INSULIN ASPART 100 UNIT/ML ~~LOC~~ SOLN
0.0000 [IU] | Freq: Three times a day (TID) | SUBCUTANEOUS | Status: DC
  Administered 2018-03-07 – 2018-03-08 (×2): 1 [IU] via SUBCUTANEOUS
  Administered 2018-03-09: 2 [IU] via SUBCUTANEOUS
  Administered 2018-03-10 – 2018-03-11 (×6): 1 [IU] via SUBCUTANEOUS
  Administered 2018-03-12: 2 [IU] via SUBCUTANEOUS
  Administered 2018-03-12 (×2): 1 [IU] via SUBCUTANEOUS
  Administered 2018-03-13: 2 [IU] via SUBCUTANEOUS
  Administered 2018-03-13: 1 [IU] via SUBCUTANEOUS

## 2018-03-06 MED ORDER — FUROSEMIDE 10 MG/ML IJ SOLN
60.0000 mg | Freq: Two times a day (BID) | INTRAMUSCULAR | Status: DC
  Administered 2018-03-06 – 2018-03-08 (×4): 60 mg via INTRAVENOUS
  Filled 2018-03-06 (×4): qty 6

## 2018-03-06 MED ORDER — VITAMIN D 1000 UNITS PO TABS
1000.0000 [IU] | ORAL_TABLET | Freq: Every day | ORAL | Status: DC
  Administered 2018-03-07 – 2018-03-13 (×7): 1000 [IU] via ORAL
  Filled 2018-03-06 (×10): qty 1

## 2018-03-06 MED ORDER — ALPRAZOLAM 0.25 MG PO TABS
0.2500 mg | ORAL_TABLET | ORAL | Status: DC
  Administered 2018-03-07: 0.25 mg via ORAL
  Administered 2018-03-09: 0.5 mg via ORAL
  Administered 2018-03-10: 0.25 mg via ORAL
  Administered 2018-03-11 – 2018-03-12 (×2): 0.5 mg via ORAL
  Filled 2018-03-06 (×2): qty 2
  Filled 2018-03-06 (×2): qty 1
  Filled 2018-03-06: qty 2

## 2018-03-06 MED ORDER — IRBESARTAN 300 MG PO TABS
300.0000 mg | ORAL_TABLET | Freq: Every day | ORAL | Status: DC
  Administered 2018-03-06 – 2018-03-13 (×8): 300 mg via ORAL
  Filled 2018-03-06 (×8): qty 1

## 2018-03-06 MED ORDER — ACETAMINOPHEN 325 MG PO TABS
650.0000 mg | ORAL_TABLET | ORAL | Status: DC | PRN
  Administered 2018-03-08 – 2018-03-09 (×2): 650 mg via ORAL
  Filled 2018-03-06 (×2): qty 2

## 2018-03-06 MED ORDER — POLYETHYL GLYC-PROPYL GLYC PF 0.4-0.3 % OP SOLN: 1.0000 [drp] | Freq: Three times a day (TID) | OPHTHALMIC | Status: DC | PRN

## 2018-03-06 MED ORDER — TRAMADOL HCL 50 MG PO TABS
50.0000 mg | ORAL_TABLET | Freq: Four times a day (QID) | ORAL | Status: DC | PRN
  Administered 2018-03-06 – 2018-03-13 (×4): 50 mg via ORAL
  Filled 2018-03-06 (×4): qty 1

## 2018-03-06 MED ORDER — ONDANSETRON HCL 4 MG/2ML IJ SOLN
4.0000 mg | Freq: Four times a day (QID) | INTRAMUSCULAR | Status: DC | PRN
  Administered 2018-03-06 – 2018-03-07 (×2): 4 mg via INTRAVENOUS
  Filled 2018-03-06 (×2): qty 2

## 2018-03-06 MED ORDER — POLYVINYL ALCOHOL 1.4 % OP SOLN
1.0000 [drp] | OPHTHALMIC | Status: DC | PRN
Start: 2018-03-06 — End: 2018-03-13
  Filled 2018-03-06: qty 15

## 2018-03-06 MED ORDER — METOPROLOL TARTRATE 50 MG PO TABS
50.0000 mg | ORAL_TABLET | Freq: Two times a day (BID) | ORAL | Status: DC
  Administered 2018-03-06 – 2018-03-13 (×15): 50 mg via ORAL
  Filled 2018-03-06 (×15): qty 1

## 2018-03-06 MED ORDER — SODIUM CHLORIDE 0.9% FLUSH
3.0000 mL | Freq: Two times a day (BID) | INTRAVENOUS | Status: DC
  Administered 2018-03-06: 10 mL via INTRAVENOUS
  Administered 2018-03-07 – 2018-03-13 (×12): 3 mL via INTRAVENOUS

## 2018-03-06 MED ORDER — POTASSIUM CHLORIDE CRYS ER 20 MEQ PO TBCR
20.0000 meq | EXTENDED_RELEASE_TABLET | Freq: Two times a day (BID) | ORAL | Status: DC
  Administered 2018-03-06 – 2018-03-13 (×15): 20 meq via ORAL
  Filled 2018-03-06 (×15): qty 1

## 2018-03-06 MED ORDER — ENSURE ENLIVE PO LIQD
237.0000 mL | Freq: Two times a day (BID) | ORAL | Status: DC
  Administered 2018-03-07: 237 mL via ORAL

## 2018-03-06 MED ORDER — ATORVASTATIN CALCIUM 80 MG PO TABS
80.0000 mg | ORAL_TABLET | Freq: Every day | ORAL | Status: DC
  Administered 2018-03-06 – 2018-03-12 (×7): 80 mg via ORAL
  Filled 2018-03-06 (×7): qty 1

## 2018-03-06 NOTE — ED Provider Notes (Signed)
[facility] EMERGENCY DEPARTMENT Provider Note   CSN: [id number] Arrival date & time: 03/06/18  1100   History   Chief Complaint Chief Complaint  Patient presents with  . Chest Pain    HPI [name] is a 77 y.o. female with a past medical history of HFpEF, CAD s/p CABG, chronic a fib, AV valve replacement on Warfarin, PAD, recent cholecystitis s/p cholecystectomy who presented to the ED with complaints of chest pain.   She was recently hospitalized from 5/16-5/28 for abdominal pain, ultimately underwent laparoscopic cholecystectomy.  She reports that during her hospitalization she slowly became more fluid overloaded with increased edema.  Does note receiving extra Lasix dose but feels she has not had significant urine output at that dose or with home dose since discharge.  She wore compression stockings following discharge until yesterday evening when she took them off to discomfort.  She also had onset of chest pain described as a "smothering pain" which was worse with lying down and at night.  Denies nausea, diaphoresis, fever.  She was discharged on oxygen, has not increased flow rate since discharge. She has been having regular BMs since discharge, some residual abdominal soreness.   Past Medical History:  Diagnosis Date  . Aortic atherosclerosis ([place]) 01/23/2018  . Atrial fibrillation ([place])   . Carotid artery disease ([place])   . Cholelithiasis 01/23/2018  . Chronic anticoagulation   . Coronary artery disease    status post coronary artery bypass grafting times 07/10/2004  . Diabetes mellitus   . Hypercholesteremia   . Hypertension   . Mechanical heart valve present    H. aortic valve replacement at the time of bypass surgery October 2005  . Peripheral arterial disease (HCC)    history of left common iliac artery PTA and stenting for a chronic total occlusion 08/26/01    Patient Active Problem List   Diagnosis Date Noted  . UTI (urinary tract infection)  02/22/2018  . Cholecystitis 02/21/2018  . GERD (gastroesophageal reflux disease) 02/21/2018  . Anxiety 02/21/2018  . Chronic diastolic CHF (congestive heart failure) ([place]) 02/21/2018  . Diarrhea 02/21/2018  . Rash 02/21/2018  . General weakness 02/02/2018  . Macular rash 01/25/2018  . Thrombocytopenia ([place]) 01/25/2018  . Arm pain, diffuse 01/25/2018  . Symptomatic anemia 01/23/2018  . Cholelithiasis 01/23/2018  . Aortic atherosclerosis ([place]) 01/23/2018  . GI bleed 01/18/2018  . Atrial fibrillation ([place]) 01/12/2016  . Coronary artery disease 08/18/2013  . Carotid artery disease ([place]) 08/18/2013  . Peripheral arterial disease ([place]) 08/18/2013  . Diabetes mellitus without complication ([place]) 50/53/9767  . Benign positional vertigo 10/06/2011  . Hyponatremia 10/06/2011  . Influenza 10/06/2011  . Sinusitis 10/06/2011  . Hypertension   . Hypercholesteremia   . Mechanical heart valve present   . Chronic anticoagulation     Past Surgical History:  Procedure Laterality Date  . AORTIC VALVE REPLACEMENT    . CARDIAC CATHETERIZATION  11/10/2004   40% right common illiac, 70% in stent restenosis of distal left common illiac,   . CARDIAC CATHETERIZATION  05/18/2004   LAD 50-70% midstenosis, RCA dominant w/50% stenosis, 50% Right common Illiac artery ostial stenosis, 90% in stent restenosis within midportion of left common illiac stent  . Carotid Duplex  03/12/2012   RSA-elev. velocities suggestive of a 50-69% diameter reduction, Right&Left Bulb/Prox ICA-mild-mod.fibrous plaqueelevating Velocities abnormal study.  . CHOLECYSTECTOMY N/A 03/01/2018   Procedure: LAPAROSCOPIC CHOLECYSTECTOMY;  Surgeon: Judeth Horn, MD;  Location: [facility];  Service: General;  Laterality: N/A;  . COLONOSCOPY WITH PROPOFOL N/A 01/22/2018   Procedure: COLONOSCOPY WITH PROPOFOL;  Surgeon: Wilford Corner, MD;  Location: [facility];  Service: Endoscopy;  Laterality: N/A;  . ESOPHAGOGASTRODUODENOSCOPY (EGD) WITH  PROPOFOL N/A 01/22/2018   Procedure: ESOPHAGOGASTRODUODENOSCOPY (EGD) WITH PROPOFOL;  Surgeon: Wilford Corner, MD;  Location: [facility];  Service: Endoscopy;  Laterality: N/A;  . Lower Ext. Duplex  03/12/2012   Right Proximal CIA- vessel narrowing w/elevated velocities 0-49% diameter reduction. Right SFA-mild mixed density plaque throughout vessel.  [name] NM MYOCAR PERF WALL MOTION  05/19/2010   protocol: Persantine, post stress EF 65%, negative for ischemia, low risk scan  . TRANSTHORACIC ECHOCARDIOGRAM  08/29/2012   Moderately calcified annulus of mitral valve, moderate regurg. of both mitral valve and tricuspid valve.      OB History   None      Home Medications    Prior to Admission medications   Medication Sig Start Date End Date Taking? Authorizing Provider  acetaminophen (TYLENOL) 325 MG tablet Take 2 tablets (650 mg total) by mouth every 6 (six) hours as needed for mild pain, fever or headache (for pain or headaches). 03/03/18   Rayburn, Floyce Stakes, PA-C  ALPRAZolam (XANAX) 0.5 MG tablet Take 0.25-0.5 mg by mouth See admin instructions. Take 0.25-0.5 mg two times a day as needed for anxiety and 0.25-0.5 mg at bedtime scheduled    [provider]  aspirin EC 81 MG tablet Take 81 mg by mouth every Monday, Wednesday, and Friday.     [provider]  atorvastatin (LIPITOR) 80 MG tablet Take 1 tablet (80 mg total) by mouth daily. Patient taking differently: Take 80 mg by mouth daily with supper.  09/25/17   Lorretta Harp, MD  Cholecalciferol (VITAMIN D3) 1000 UNITS CAPS Take 1,000 Units by mouth daily.     [provider]  ferrous sulfate 325 (65 FE) MG tablet Take 1 tablet (325 mg total) by mouth 2 (two) times daily with a meal. 03/05/18   Emokpae, Courage, MD  furosemide (LASIX) 20 MG tablet Take 3 tablets (60 mg total) by mouth daily. 03/05/18   Roxan Hockey, MD  glipiZIDE (GLUCOTROL XL) 2.5 MG 24 hr tablet Take 2.5 mg by mouth daily.  04/04/17    [provider]  hydrocortisone cream 1 % Apply topically 2 (two) times daily. 01/29/18   [name], MD  irbesartan (AVAPRO) 300 MG tablet Take 1 tablet (300 mg total) by mouth daily. 09/25/17   Lorretta Harp, MD  magnesium oxide (MAG-OX) 400 (241.3 Mg) MG tablet Take 1 tablet (400 mg total) by mouth daily. 01/30/18   [name], MD  metFORMIN (GLUCOPHAGE) 500 MG tablet Take 500 mg by mouth 2 (two) times daily with a meal.     [provider]  metoprolol tartrate (LOPRESSOR) 50 MG tablet Take 50 mg by mouth 2 (two) times daily. 10/16/17   [provider]  ondansetron (ZOFRAN ODT) 4 MG disintegrating tablet 4mg  ODT q6-8 hours prn nausea/vomit 03/05/18   Emokpae, Courage, MD  pantoprazole (PROTONIX) 40 MG tablet Take 40 mg by mouth daily.  12/05/14   [provider]  Polyethyl Glyc-Propyl Glyc PF (SYSTANE PRESERVATIVE FREE) 0.4-0.3 % SOLN Place 1-2 drops into both eyes 3 (three) times daily as needed (for irritation).     [provider]  potassium chloride SA (K-DUR,KLOR-CON) 20 MEQ tablet Take 1 tablet (20 mEq total) by mouth daily. 01/30/18   [name], MD  saccharomyces boulardii (FLORASTOR) 250 MG capsule Take 1 capsule (250 mg total) by mouth 2 (two) times daily. 03/05/18   Roxan Hockey, MD  traMADol (ULTRAM) 50 MG tablet Take 1 tablet (50 mg total) by mouth every 6 (six) hours as needed for moderate pain. 03/05/18   Roxan Hockey, MD  vitamin B-12 (CYANOCOBALAMIN) 100 MCG tablet Take 100 mcg by mouth daily.     [provider]  warfarin (COUMADIN) 5 MG tablet TAKE 1 TO 1.5 (ONE TO ONE & ONE-HALF) TABLETS BY MOUTH DAILY AS DIRECTED BY COUMADIN CLINIC Patient taking differently: Take 5 mg by mouth in the morning on Sun/Tues/Thurs/Sat and 7.5 mg on Mon/Wed/Fri 09/10/17   Lorretta Harp, MD    Family History Family History  Problem Relation Age of Onset  . Breast cancer Neg Hx     Social History Social  History   Tobacco Use  . Smoking status: Former Smoker    Packs/day: 1.00    Years: 30.00    Pack years: 30.00    Types: Cigarettes  . Smokeless tobacco: Never Used  Substance Use Topics  . Alcohol use: No    Alcohol/week: 0.0 oz  . Drug use: No     Allergies   Flagyl [metronidazole]; Coreg [carvedilol]; Losartan; Spironolactone; Verapamil; Zetia [ezetimibe]; and Zocor [simvastatin - high dose]   Review of Systems Review of Systems  Constitutional: Positive for unexpected weight change. Negative for chills and fever.  Respiratory: Positive for shortness of breath.   Cardiovascular: Positive for chest pain and leg swelling. Negative for palpitations.  Gastrointestinal: Positive for abdominal pain. Negative for constipation.     Physical Exam Updated Vital Signs BP (!) 157/68   Pulse 62   Temp 98.3 F (36.8 C) (Skin)   Resp 19   Ht 5\' 1"  (1.549 m)   Wt 67.6 kg (149 lb)   SpO2 97%   BMI 28.15 kg/m   General: Elderly female resting on ED stretcher, no acute distress Head: Normocephalic, atraumatic Eyes: Normal conjuctiva, EOMI, corrective lenses ENT: JVD present, Stow in place, moist mucus membranes without pharyngeal exudate  CV: Irregular, normal rate, systolic click of artificial valve Resp: Bilateral crackles to lung fields, normal work of breathing, no distress  Abd: Soft, +BS, trochar port site incisions healing well with some superficial ecchymosis surrounding  Extr: Bilateral pitting edema to LEs up to thighs and on lateral hip, extremities warm   Neuro: Alert and oriented x3, moving all extremities spontaneously  Skin: Warm, dry, normal skin turgor       ED Treatments / Results  Labs (all labs ordered are listed, but only abnormal results are displayed) Labs Reviewed  BASIC METABOLIC PANEL - Abnormal; Notable for the following components:      Result Value   Sodium 131 (*)    Chloride 100 (*)    Glucose, Bld 132 (*)    Calcium 8.4 (*)    GFR calc  non Af Amer 58 (*)    All other components within normal limits  CBC - Abnormal; Notable for the following components:   WBC 2.6 (*)    Hemoglobin 9.7 (*)    HCT 31.9 (*)    MCV 75.8 (*)    MCH 23.0 (*)    RDW 19.5 (*)    All other components within normal limits  BRAIN NATRIURETIC PEPTIDE - Abnormal; Notable for the following components:   B Natriuretic Peptide 790.8 (*)    All other components within normal limits  I-STAT TROPONIN, ED    EKG EKG Interpretation  Date/Time:  Wednesday Mar 06 2018 11:26:28 EDT Ventricular Rate:  69 PR Interval:    QRS Duration: 96 QT Interval:  404 QTC Calculation: 432 R Axis:   134 Text Interpretation:  Atrial fibrillation Left posterior fascicular block Nonspecific ST and T wave abnormality Abnormal ECG No significant change since last tracing Confirmed by Merrily Pew [phone]) on 03/06/2018 1:49:57 PM   Radiology Dg Chest 2 View  Result Date: 03/06/2018 CLINICAL DATA:  Chest pain since yesterday. Swelling and weight gain. History of congestive heart failure. EXAM: CHEST - 2 VIEW COMPARISON:  03/04/2018 and multiple previous FINDINGS: Bilateral effusions continue to enlarge, associated with more basilar atelectasis. There is chronic cardiomegaly. The patient has had previous median sternotomy and CABG. There is aortic atherosclerosis. There is venous hypertension with interstitial edema. Calcified granulomas as seen previously. No acute bone finding. IMPRESSION: Congestive heart failure. Enlarging effusions with basilar atelectasis. Venous hypertension and interstitial edema. Electronically Signed   By: Nelson Chimes M.D.   On: 03/06/2018 11:53    Procedures Procedures (including critical care time)  Medications Ordered in ED Medications  furosemide (LASIX) injection 60 mg (60 mg Intravenous Given 03/06/18 1358)     Initial Impression / Assessment and Plan / ED Course  I have reviewed the triage vital signs and the nursing  notes.  Pertinent labs & imaging results that were available during my care of the patient were reviewed by me and considered in my medical decision making (see chart for details).  77 yo F with a hx of HFpEF with grade 3 DD and EF 55-60% last echo presenting with chest pain and signs of volume overload on physical exam and chest x-ray--worsening effusion and edema. Also reporting 6 pound weight gain in last 24 hours. Troponin negative and EKG not suggestive of ACS. She has remained adherent on her home Lasix dose of 60 mg daily.  Currently on home oxygen was arranged at discharge.  Sodium mildly decreased at 131, likely related to hypervolemia.  Will likely require IV diuresis for improvement in volume status, will consult hospitalist for admission and further management. Will order dose of IV Lasix 60 mg, add BNP to initial labs--returned elevated at 790.    Final Clinical Impressions(s) / ED Diagnoses   Final diagnoses:  Acute on chronic diastolic congestive heart failure [facility])    ED Discharge Orders    None       Tawny Asal, MD 03/06/18 1512    Mesner, Corene Cornea, MD 03/07/18 1014

## 2018-03-06 NOTE — Progress Notes (Signed)
ANTICOAGULATION CONSULT NOTE - Initial Consult  Pharmacy Consult for warfarin Indication: mechanical valve  Allergies  Allergen Reactions  . Flagyl [Metronidazole] Rash    ALL-OVER BODY RASH  . Coreg [Carvedilol] Other (See Comments)    Terrible cramping in the feet and had a lot of bowel movements, but not diarrhea  . Losartan Swelling    Patient doesn't recall site of swelling  . Spironolactone Nausea Only    Caused extreme nausea  . Verapamil Hives  . Zetia [Ezetimibe] Other (See Comments)    Reaction not recalled  . Zocor [Simvastatin - High Dose] Other (See Comments)    Reaction not recalled    Patient Measurements: Height: 5\' 1"  (154.9 cm) Weight: 149 lb (67.6 kg) IBW/kg (Calculated) : 47.8  Vital Signs: Temp: 98.3 F (36.8 C) (05/29 1112) Temp Source: Skin (05/29 1112) BP: 147/104 (05/29 1445) Pulse Rate: 74 (05/29 1445)  Labs: Recent Labs    03/04/18 0302 03/04/18 1243 03/05/18 0702 03/05/18 0706 03/06/18 1128  HGB 10.5*  --  9.5*  --  9.7*  HCT 34.7*  --  31.8*  --  31.9*  PLT 242  --  180  --  199  LABPROT 25.6*  --  35.2*  --   --   INR 2.36  --  3.54  --   --   HEPARINUNFRC  --  0.49  --  0.47  --   CREATININE 0.97  --  1.03*  --  0.91    Estimated Creatinine Clearance: 44.1 mL/min (by C-G formula based on SCr of 0.91 mg/dL).   Medical History: Past Medical History:  Diagnosis Date  . Aortic atherosclerosis ([place]) 01/23/2018  . Atrial fibrillation ([place])   . Carotid artery disease ([place])   . Cholelithiasis 01/23/2018  . Chronic anticoagulation   . Coronary artery disease    status post coronary artery bypass grafting times 07/10/2004  . Diabetes mellitus   . Hypercholesteremia   . Hypertension   . Mechanical heart valve present    H. aortic valve replacement at the time of bypass surgery October 2005  . Moderate to severe pulmonary hypertension ([place])   . Peripheral arterial disease (H[facility])    history of left common iliac artery PTA and  stenting for a chronic total occlusion 08/26/01    Assessment: 40 yof just discharged yesterday presents with worsening SOB. She is on chronic coumadin for history of a mechanical valve. INR is supratherapeutic at 4.58 today. Hgb is low at 9.7 and platelets are WNL. No bleeding noted.   Goal of Therapy:  INR 2.5-3.5 Monitor platelets by anticoagulation protocol: Yes   Plan:  No warfarin tonight Daily INR  Kenechukwu Eckstein, Rande Lawman 03/06/2018,3:36 PM

## 2018-03-06 NOTE — ED Provider Notes (Signed)
I saw and evaluated the patient, reviewed the resident's note and I agree with the findings and plan with the following exceptions.   77 year old female with multiple medical problems who was just discharged in the hospital yesterday after 2 weeks today secondary to cholecystectomy.  Patient states she been having some chest pain shortness of breath progressively worsening for last few days has some that she has some low oxygen to him she was sent home on oxygen.  She is given a dose of IV Lasix before leaving the hospital yesterday but still states that she is gained 6 pounds.   On exam she has fine rales, significant lower extremity edema but past her knees, JVD and tachypnea.  She is still on her oxygen she is discharged with.   X-rays consistent with worsening effusions and likely pulmonary edema.   She has been compliant with medications and got IV Lasix prior to discharge and still gained weight and declined think patient needs further diuresis.  Will give another dose of Lasix and admit to hospitalist.   EKG Interpretation  Date/Time:  Wednesday Mar 06 2018 11:26:28 EDT Ventricular Rate:  69 PR Interval:    QRS Duration: 96 QT Interval:  404 QTC Calculation: 432 R Axis:   134 Text Interpretation:  Atrial fibrillation Left posterior fascicular block Nonspecific ST and T wave abnormality Abnormal ECG No significant change since last tracing Confirmed by Merrily Pew [phone]) on 03/06/2018 1:49:57 PM         Tyren Dugar, Corene Cornea, MD 03/07/18 1013

## 2018-03-06 NOTE — H&P (Addendum)
History and Physical    [name] IWP:[id number] DOB: 1941/03/23 DOA: 03/06/2018  **Will admit patient based on the expectation that the patient will need hospitalization/ hospital care that crosses at least 2 midnights  PCP: [name], [name], MD   Attending physician: Lorin Mercy  Patient coming from/Resides with: Private residence  Chief Complaint: Shortness of breath, chest pressure  HPI: [name] is a 77 y.o. female with medical history significant for hypertension, chronic grade 3 diastolic heart failure, deficiency anemia, chronic atrial fibrillation chronic status post AVR on warfarin, diabetes on oral agents.  Patient was discharged on 5/28 after protracted hospitalization in the context of calculus cholecystitis status post cholecystectomy.  Surgery was delayed in the context of supratherapeutic INR and need for vitamin K reversal.  Anticoagulation was resumed on 5/24 on the afternoon of surgery.  Patient was noted with lower extremity edema and chest x-ray positive for heart failure 5/27 and was given an IV Lasix dose of 20 mg x 1.  Patient was not on oxygen prior to admission but was discharged home on 2 L oxygen.  Patient reports over the past 24 hours she has noted increased shortness of breath, productive cough of clear to pinkish tinged sputum.  And worsening lower extremity edema.  Of note weight at time of admission on 5/17 was 139 lbs.  Today's weight is 149 lbs.  Chest x-ray in the ER consistent with CHF and BNP elevated at 791.  Patient has been given a dose of Lasix 60 mg IV x1 in the ER noting home dose of 60 mg daily.  ED Course:  Vital Signs: BP (!) 147/104   Pulse 74   Temp 98.3 F (36.8 C) (Skin)   Resp 18   Ht 5\' 1"  (1.549 m)   Wt 67.6 kg (149 lb)   SpO2 93%   BMI 28.15 kg/m  CXR: CHF with venous hypertension and interstitial edema, large and effusions Lab data: Na 131 , potassium 3.9, chloride 100, CO2 24, glucose 132, BUN 15, creatinine 0.91, anion  gap 7, BNP 791, point-of-care troponin normal, white count 2600 differential not obtained, hemoglobin 9.7, platelets 199,000-INR 4.58 Medications and treatments: Lasix 60 mg IV x1  Review of Systems:  In addition to the HPI above,  No Fever-chills, myalgias or other constitutional symptoms No Headache, changes with Vision or hearing, new weakness, tingling, numbness in any extremity, dizziness, dysarthria or word finding difficulty, gait disturbance or imbalance, tremors or seizure activity No problems swallowing food or Liquids, indigestion/reflux, choking or coughing while eating, abdominal pain with or after eating No palpitations No N/V, melena,hematochezia, dark tarry stools, constipation No dysuria, malodorous urine, hematuria or flank pain No new skin rashes, lesions, masses or bruises, No new joint pains, aches, swelling or redness No recent unintentional weight loss No polyuria, polydypsia or polyphagia   Past Medical History:  Diagnosis Date  . Aortic atherosclerosis ([place]) 01/23/2018  . Atrial fibrillation ([place])   . Carotid artery disease ([place])   . Cholelithiasis 01/23/2018  . Chronic anticoagulation   . Coronary artery disease    status post coronary artery bypass grafting times 07/10/2004  . Diabetes mellitus   . Hypercholesteremia   . Hypertension   . Mechanical heart valve present    H. aortic valve replacement at the time of bypass surgery October 2005  . Peripheral arterial disease ([place])    history of left common iliac artery PTA and stenting for a chronic total occlusion 08/26/01  Past Surgical History:  Procedure Laterality Date  . AORTIC VALVE REPLACEMENT    . CARDIAC CATHETERIZATION  11/10/2004   40% right common illiac, 70% in stent restenosis of distal left common illiac,   . CARDIAC CATHETERIZATION  05/18/2004   LAD 50-70% midstenosis, RCA dominant w/50% stenosis, 50% Right common Illiac artery ostial stenosis, 90% in stent restenosis within midportion  of left common illiac stent  . Carotid Duplex  03/12/2012   RSA-elev. velocities suggestive of a 50-69% diameter reduction, Right&Left Bulb/Prox ICA-mild-mod.fibrous plaqueelevating Velocities abnormal study.  . CHOLECYSTECTOMY N/A 03/01/2018   Procedure: LAPAROSCOPIC CHOLECYSTECTOMY;  Surgeon: Judeth Horn, MD;  Location: [facility];  Service: General;  Laterality: N/A;  . COLONOSCOPY WITH PROPOFOL N/A 01/22/2018   Procedure: COLONOSCOPY WITH PROPOFOL;  Surgeon: Wilford Corner, MD;  Location: [facility];  Service: Endoscopy;  Laterality: N/A;  . ESOPHAGOGASTRODUODENOSCOPY (EGD) WITH PROPOFOL N/A 01/22/2018   Procedure: ESOPHAGOGASTRODUODENOSCOPY (EGD) WITH PROPOFOL;  Surgeon: Wilford Corner, MD;  Location: [facility];  Service: Endoscopy;  Laterality: N/A;  . Lower Ext. Duplex  03/12/2012   Right Proximal CIA- vessel narrowing w/elevated velocities 0-49% diameter reduction. Right SFA-mild mixed density plaque throughout vessel.  [name] NM MYOCAR PERF WALL MOTION  05/19/2010   protocol: Persantine, post stress EF 65%, negative for ischemia, low risk scan  . TRANSTHORACIC ECHOCARDIOGRAM  08/29/2012   Moderately calcified annulus of mitral valve, moderate regurg. of both mitral valve and tricuspid valve.     Social History   Socioeconomic History  . Marital status: Widowed    Spouse name: Not on file  . Number of children: Not on file  . Years of education: Not on file  . Highest education level: Not on file  Occupational History  . Not on file  Social Needs  . Financial resource strain: Not on file  . Food insecurity:    Worry: Not on file    Inability: Not on file  . Transportation needs:    Medical: Not on file    Non-medical: Not on file  Tobacco Use  . Smoking status: Former Smoker    Packs/day: 1.00    Years: 30.00    Pack years: 30.00    Types: Cigarettes  . Smokeless tobacco: Never Used  Substance and Sexual Activity  . Alcohol use: No    Alcohol/week: 0.0 oz  . Drug  use: No  . Sexual activity: Not on file  Lifestyle  . Physical activity:    Days per week: Not on file    Minutes per session: Not on file  . Stress: Not on file  Relationships  . Social connections:    Talks on phone: Not on file    Gets together: Not on file    Attends religious service: Not on file    Active member of club or organization: Not on file    Attends meetings of clubs or organizations: Not on file    Relationship status: Not on file  . Intimate partner violence:    Fear of current or ex partner: Not on file    Emotionally abused: Not on file    Physically abused: Not on file    Forced sexual activity: Not on file  Other Topics Concern  . Not on file  Social History Narrative  . Not on file    Mobility: Currently requiring rolling walker Work history: None   Allergies  Allergen Reactions  . Flagyl [Metronidazole] Rash    ALL-OVER BODY RASH  . Coreg [  Carvedilol] Other (See Comments)    Terrible cramping in the feet and had a lot of bowel movements, but not diarrhea  . Losartan Swelling    Patient doesn't recall site of swelling  . Spironolactone Nausea Only    Caused extreme nausea  . Verapamil Hives  . Zetia [Ezetimibe] Other (See Comments)    Reaction not recalled  . Zocor [Simvastatin - High Dose] Other (See Comments)    Reaction not recalled    Family History  Problem Relation Age of Onset  . Breast cancer Neg Hx      Prior to Admission medications   Medication Sig Start Date End Date Taking? Authorizing Provider  acetaminophen (TYLENOL) 325 MG tablet Take 2 tablets (650 mg total) by mouth every 6 (six) hours as needed for mild pain, fever or headache (for pain or headaches). 03/03/18   Rayburn, Floyce Stakes, PA-C  ALPRAZolam (XANAX) 0.5 MG tablet Take 0.25-0.5 mg by mouth See admin instructions. Take 0.25-0.5 mg two times a day as needed for anxiety and 0.25-0.5 mg at bedtime scheduled    [provider]  aspirin EC 81 MG tablet Take 81 mg  by mouth every Monday, Wednesday, and Friday.     [provider]  atorvastatin (LIPITOR) 80 MG tablet Take 1 tablet (80 mg total) by mouth daily. Patient taking differently: Take 80 mg by mouth daily with supper.  09/25/17   Lorretta Harp, MD  Cholecalciferol (VITAMIN D3) 1000 UNITS CAPS Take 1,000 Units by mouth daily.     [provider]  ferrous sulfate 325 (65 FE) MG tablet Take 1 tablet (325 mg total) by mouth 2 (two) times daily with a meal. 03/05/18   Emokpae, Courage, MD  furosemide (LASIX) 20 MG tablet Take 3 tablets (60 mg total) by mouth daily. 03/05/18   Roxan Hockey, MD  glipiZIDE (GLUCOTROL XL) 2.5 MG 24 hr tablet Take 2.5 mg by mouth daily.  04/04/17   [provider]  hydrocortisone cream 1 % Apply topically 2 (two) times daily. 01/29/18   [name], MD  irbesartan (AVAPRO) 300 MG tablet Take 1 tablet (300 mg total) by mouth daily. 09/25/17   Lorretta Harp, MD  magnesium oxide (MAG-OX) 400 (241.3 Mg) MG tablet Take 1 tablet (400 mg total) by mouth daily. 01/30/18   [name], MD  metFORMIN (GLUCOPHAGE) 500 MG tablet Take 500 mg by mouth 2 (two) times daily with a meal.     [provider]  metoprolol tartrate (LOPRESSOR) 50 MG tablet Take 50 mg by mouth 2 (two) times daily. 10/16/17   [provider]  ondansetron (ZOFRAN ODT) 4 MG disintegrating tablet 4mg  ODT q6-8 hours prn nausea/vomit 03/05/18   Emokpae, Courage, MD  pantoprazole (PROTONIX) 40 MG tablet Take 40 mg by mouth daily.  12/05/14   [provider]  Polyethyl Glyc-Propyl Glyc PF (SYSTANE PRESERVATIVE FREE) 0.4-0.3 % SOLN Place 1-2 drops into both eyes 3 (three) times daily as needed (for irritation).     [provider]  potassium chloride SA (K-DUR,KLOR-CON) 20 MEQ tablet Take 1 tablet (20 mEq total) by mouth daily. 01/30/18   [name], MD  saccharomyces boulardii (FLORASTOR) 250 MG capsule Take 1 capsule (250 mg total) by  mouth 2 (two) times daily. 03/05/18   Roxan Hockey, MD  traMADol (ULTRAM) 50 MG tablet Take 1 tablet (50 mg total) by mouth every 6 (six) hours as needed for moderate pain. 03/05/18   Emokpae,  Courage, MD  vitamin B-12 (CYANOCOBALAMIN) 100 MCG tablet Take 100 mcg by mouth daily.     [provider]  warfarin (COUMADIN) 5 MG tablet TAKE 1 TO 1.5 (ONE TO ONE & ONE-HALF) TABLETS BY MOUTH DAILY AS DIRECTED BY COUMADIN CLINIC Patient taking differently: Take 5 mg by mouth in the morning on Sun/Tues/Thurs/Sat and 7.5 mg on Mon/Wed/Fri 09/10/17   Lorretta Harp, MD    Physical Exam: Vitals:   03/06/18 1400 03/06/18 1415 03/06/18 1430 03/06/18 1445  BP: (!) 134/47 (!) 144/39 137/90 (!) 147/104  Pulse: 64 80 79 74  Resp: 18 17 (!) 24 18  Temp:      TempSrc:      SpO2: 97% 95% 92% 93%  Weight:      Height:          Constitutional: NAD, calm, uncomfortable secondary to ongoing shortness of breath Eyes: PERRL, lids and conjunctivae normal ENMT: Mucous membranes are moist. Posterior pharynx clear of any exudate or lesions.Normal dentition.  Neck: normal, supple, no masses, no thyromegaly Respiratory: Diffuse bilateral crackles somewhat diminished in the bases.  Normal respiratory effort. No accessory muscle use. 2L Cardiovascular: Irregular rhythm with underlying atrial fibrillation, no murmurs / rubs / gallops.  Crisp aortic valvular click noted.  Marked 3+ bilateral lower edema. 2+ pedal pulses. No carotid bruits.  Abdomen: no tenderness, no masses palpated. No hepatosplenomegaly. Bowel sounds positive.  Musculoskeletal: no clubbing / cyanosis. No joint deformity upper and lower extremities. Good ROM, no contractures. Normal muscle tone.  Skin: no rashes, lesions, ulcers. No induration Neurologic: CN 2-12 grossly intact. Sensation intact, DTR normal. Strength 5/5 x all 4 extremities.  Psychiatric: Normal judgment and insight. Alert and oriented x 3. Normal mood.    Labs on  Admission: I have personally reviewed following labs and imaging studies  CBC: Recent Labs  Lab 03/02/18 0422 03/03/18 0341 03/04/18 0302 03/05/18 0702 03/06/18 1128  WBC 4.3 5.7 5.9 4.1 2.6*  HGB 9.6* 9.8* 10.5* 9.5* 9.7*  HCT 31.9* 32.9* 34.7* 31.8* 31.9*  MCV 76.0* 75.6* 76.3* 75.7* 75.8*  PLT 184 212 242 180 841   Basic Metabolic Panel: Recent Labs  Lab 03/01/18 0247 03/02/18 0422 03/04/18 0302 03/05/18 0702 03/06/18 1128  NA 134* 134* 132* 133* 131*  K 3.1* 3.5 4.6 4.1 3.9  CL 102 103 102 101 100*  CO2 24 22 21* 25 24  GLUCOSE 125* 103* 120* 123* 132*  BUN 8 12 17 18 15   CREATININE 0.87 0.99 0.97 1.03* 0.91  CALCIUM 7.9* 7.8* 8.3* 8.3* 8.4*   GFR: Estimated Creatinine Clearance: 44.1 mL/min (by C-G formula based on SCr of 0.91 mg/dL). Liver Function Tests: Recent Labs  Lab 03/01/18 0247 03/02/18 0422  AST 28 40  ALT 12* 15  ALKPHOS 75 69  BILITOT 0.9 1.0  PROT 6.0* 5.8*  ALBUMIN 2.2* 2.0*   No results for input(s): LIPASE, AMYLASE in the last 168 hours. No results for input(s): AMMONIA in the last 168 hours. Coagulation Profile: Recent Labs  Lab 03/01/18 0247 03/02/18 0422 03/03/18 0341 03/04/18 0302 03/05/18 0702  INR 1.59 1.61 1.64 2.36 3.54   Cardiac Enzymes: No results for input(s): CKTOTAL, CKMB, CKMBINDEX, TROPONINI in the last 168 hours. BNP (last 3 results) No results for input(s): PROBNP in the last 8760 hours. HbA1C: No results for input(s): HGBA1C in the last 72 hours. CBG: Recent Labs  Lab 03/04/18 0714 03/04/18 1117 03/04/18 1638 03/04/18 2153 03/05/18 0743  GLUCAP 115* 177*  105* 141* 118*   Lipid Profile: No results for input(s): CHOL, HDL, LDLCALC, TRIG, CHOLHDL, LDLDIRECT in the last 72 hours. Thyroid Function Tests: No results for input(s): TSH, T4TOTAL, FREET4, T3FREE, THYROIDAB in the last 72 hours. Anemia Panel: No results for input(s): VITAMINB12, FOLATE, FERRITIN, TIBC, IRON, RETICCTPCT in the last 72  hours. Urine analysis:    Component Value Date/Time   COLORURINE YELLOW 02/24/2018 1621   APPEARANCEUR CLEAR 02/24/2018 1621   LABSPEC <1.005 (L) 02/24/2018 1621   PHURINE 5.5 02/24/2018 1621   GLUCOSEU NEGATIVE 02/24/2018 1621   HGBUR SMALL (A) 02/24/2018 1621   BILIRUBINUR NEGATIVE 02/24/2018 1621   KETONESUR NEGATIVE 02/24/2018 1621   PROTEINUR NEGATIVE 02/24/2018 1621   UROBILINOGEN 1.0 10/06/2011 1709   NITRITE NEGATIVE 02/24/2018 1621   LEUKOCYTESUR NEGATIVE 02/24/2018 1621   Sepsis Labs: @LABRCNTIP (procalcitonin:4,lacticidven:4) ) Recent Results (from the past 240 hour(s))  Surgical pcr screen     Status: None   Collection Time: 03/01/18  8:06 AM  Result Value Ref Range Status   MRSA, PCR NEGATIVE NEGATIVE Final   Staphylococcus aureus NEGATIVE NEGATIVE Final    Comment: (NOTE) The Xpert SA Assay (FDA approved for NASAL specimens in patients 64 years of age and older), is one component of a comprehensive surveillance program. It is not intended to diagnose infection nor to guide or monitor treatment. Performed at [facility], [place] [address] [place], [place] [address]      Radiological Exams on Admission: Dg Chest 2 View  Result Date: 03/06/2018 CLINICAL DATA:  Chest pain since yesterday. Swelling and weight gain. History of congestive heart failure. EXAM: CHEST - 2 VIEW COMPARISON:  03/04/2018 and multiple previous FINDINGS: Bilateral effusions continue to enlarge, associated with more basilar atelectasis. There is chronic cardiomegaly. The patient has had previous median sternotomy and CABG. There is aortic atherosclerosis. There is venous hypertension with interstitial edema. Calcified granulomas as seen previously. No acute bone finding. IMPRESSION: Congestive heart failure. Enlarging effusions with basilar atelectasis. Venous hypertension and interstitial edema. Electronically Signed   By: Nelson Chimes M.D.   On: 03/06/2018 11:53    EKG: (Independently  reviewed) atrial fibrillation with ventricular rate 69 bpm, QTC 432 ms, normal R wave rotation, nonspecific ST segment changes and lateral leads, J-point elevation in inferior leads  Assessment/Plan Principal Problem:  Acute respiratory failure with hypoxia / Acute on chronic diastolic heart failure /severe pulmonary hypertension -History known grade 3 diastolic dysfunction on Lasix 60 mg daily at home returns to hospital 24 hours after discharge with lower extremity edema, increased weight, chest pressure and shortness of breath with orthopnea with chest x-ray and BNP consistent with heart failure exacerbation -Prior to last admission did not require O2 but was discharged on 2L-continue for now -Lasix 60 mg IV every 12 hours with potassium supplementation -Daily weights, strict I's/O -SBP > 140 so we will continue home beta-blocker and ARB -Echocardiogram during previous admission: EF 55 to 78%, grade 3 diastolic dysfunction, no LVH, pulmonary hypertension 88 mmHg -Repeat chest x-ray in a.m. -Patient instructed to elevate legs when sitting  Active Problems:   S/P cholecystectomy -Underwent surgery 5/24 -Still having issues with postprandial diarrhea/dumping syndrome -Continue Florastor    Acute hyponatremia -2/2 volume overload -Follow labs      Chronic atrial fibrillation  -Currently rate controlled on beta-blocker -Continue warfarin with pharmacy dosing-INR supratherapeutic-no s/s of bleeding -CHA2DS2-VASc=7    HTN (hypertension) -Continue metoprolol and Avapro    Diabetes mellitus type 2, controlled  -  Hold metformin acutely -Continue glipizide -SSI    H/O mechanical aortic valve replacement -Pharmacy managing warfarin    HLD -Continue Lipitor    **Additional lab, imaging and/or diagnostic evaluation at discretion of supervising physician  DVT prophylaxis: Warfarin Code Status: Full code Family Communication: Multiple family members at bedside Disposition Plan:  Home Consults called: None    ELLIS,ALLISON L. ANP-BC [facility] Pager [phone]   If 7PM-7AM, please contact night-coverage [email or web address] Password [facility]  03/06/2018, 3:27 PM

## 2018-03-06 NOTE — ED Triage Notes (Signed)
Patient presents to ED for assessment of diffuse chest pain with neck radiation.  Recent gallbladder surgery, dc'd from hospital on Saturday.  States she's had it while in the hospital and since she wad discharged.  Denies any associated symptoms.  Patient did leave hospital Saturday on new O2 [place] 2L

## 2018-03-07 ENCOUNTER — Telehealth: Payer: Self-pay | Admitting: Cardiovascular Disease

## 2018-03-07 ENCOUNTER — Other Ambulatory Visit: Payer: Self-pay | Admitting: *Deleted

## 2018-03-07 LAB — BASIC METABOLIC PANEL
Anion gap: 7 (ref 5–15)
BUN: 15 mg/dL (ref 6–20)
CHLORIDE: 101 mmol/L (ref 101–111)
CO2: 26 mmol/L (ref 22–32)
CREATININE: 0.87 mg/dL (ref 0.44–1.00)
Calcium: 8.5 mg/dL — ABNORMAL LOW (ref 8.9–10.3)
GFR calc Af Amer: >60 mL/min (ref >60)
GFR calc non Af Amer: >60 mL/min (ref >60)
GLUCOSE: 89 mg/dL (ref 65–99)
POTASSIUM: 3.9 mmol/L (ref 3.5–5.1)
SODIUM: 134 mmol/L — AB (ref 135–145)

## 2018-03-07 LAB — PROTIME-INR
INR: 5.8
PROTHROMBIN TIME: 51.7 s — AB (ref 11.4–15.2)

## 2018-03-07 LAB — GLUCOSE, CAPILLARY
GLUCOSE-CAPILLARY: 118 mg/dL — AB (ref 65–99)
GLUCOSE-CAPILLARY: 139 mg/dL — AB (ref 65–99)
Glucose-Capillary: 83 mg/dL (ref 65–99)
Glucose-Capillary: 140 mg/dL — ABNORMAL HIGH (ref 65–99)

## 2018-03-07 LAB — TROPONIN I

## 2018-03-07 MED ORDER — GUAIFENESIN 100 MG/5ML PO SOLN
5.0000 mL | ORAL | Status: DC | PRN
  Filled 2018-03-07: qty 5

## 2018-03-07 MED ORDER — CHOLESTYRAMINE 4 G PO PACK
4.0000 g | PACK | Freq: Three times a day (TID) | ORAL | Status: DC
  Administered 2018-03-07 (×2): 4 g via ORAL
  Filled 2018-03-07 (×16): qty 1

## 2018-03-07 MED ORDER — GLUCERNA SHAKE PO LIQD
237.0000 mL | Freq: Two times a day (BID) | ORAL | Status: DC
  Administered 2018-03-09 – 2018-03-12 (×4): 237 mL via ORAL

## 2018-03-07 MED ORDER — PRO-STAT SUGAR FREE PO LIQD
30.0000 mL | Freq: Two times a day (BID) | ORAL | Status: DC
  Administered 2018-03-08 – 2018-03-13 (×9): 30 mL via ORAL
  Filled 2018-03-07 (×11): qty 30

## 2018-03-07 MED ORDER — ADULT MULTIVITAMIN W/MINERALS CH
1.0000 | ORAL_TABLET | Freq: Every day | ORAL | Status: DC
  Administered 2018-03-08 – 2018-03-13 (×6): 1 via ORAL
  Filled 2018-03-07 (×6): qty 1

## 2018-03-07 NOTE — Progress Notes (Signed)
ED CSW received phone call from Glyn Ade with [facility]. Glyn Ade can be reached [phone].   Wendelyn Breslow, [facility]  [phone]

## 2018-03-07 NOTE — Progress Notes (Signed)
Initial Nutrition Assessment  DOCUMENTATION CODES:   Non-severe (moderate) malnutrition in context of chronic illness  INTERVENTION:   -MVI with minerals daily -Glucerna Shake po BID, each supplement provides 220 kcal and 10 grams of protein -30 ml Prostat BID, each supplement provides 100 kcals and 15 grams protien  NUTRITION DIAGNOSIS:   Moderate Malnutrition related to chronic illness(CHF) as evidenced by energy intake < 75% for > or equal to 1 month, mild fat depletion, moderate fat depletion, mild muscle depletion, edema.  GOAL:   Patient will meet greater than or equal to 90% of their needs  MONITOR:   PO intake, Supplement acceptance, Labs, Weight trends, Skin, I & O's  REASON FOR ASSESSMENT:   Malnutrition Screening Tool    ASSESSMENT:   [name] is a 77 y.o. female with a Past Medical History of PAD; moderate to severe HTN; mechanical valve on Coumadin; HLD; DM; CAD; and afib who presents with persistent volume overload after d/c yesterday.   Pt admitted with heart failure exacerbation.   Case discussed with RN, who reports pt with good appetite. She just provided pt with Glucerna shake. Noted meal completion 85%.   Spoke with pt and son at bedside. Pt reports a general decline in health over the past 3 months, secondary to gallbladder issues. Pt reports that she had difficulty tolerating most foods due to abdominal pain. Commonly consumed foods included hamburgers and chicken nuggets. Prior to health decline, pt was independent and cooking all of her meals on her own (pinto beans, corn bread, and green beans). She admits to using salt while cooking, but not at the table. However, since pt has been sick, she has been consuming mainly fast food brought in by family members because is she unable to cook for herself.   Pt endorses a 10-20# wt loss, which "I gained back because of this fluid". Noted pt has experienced a 6.7% wt loss over the past 6 weeks, which is  concerning for time frame, especially given edema.   Pt sipping on Glucerna shake at time of visit, which she prefers cold. Discussed importance of good meal and supplement intake to promote healing. Also discussed light, easy to prepare and digest food that is lower in sodium to help prevent fluid overload.   Last Hgb A1c: 6.4 (10/06/11). Pt reports Hgb A1c usually ranges around the 6 range. PTA DM medications 2.5 mg and 500 mg metformin BID. Pt admits to not checking her blood surgars routinely, but is compliant with medications.   Labs reviewed: Na: 134, CBGS WDL (inpatient orders for glycemic control are 0-5 units insulin aspart q HS and 0-9 units insulin aspart TID with meals).  NUTRITION - FOCUSED PHYSICAL EXAM:    Most Recent Value  Orbital Region  Mild depletion  Upper Arm Region  Moderate depletion  Thoracic and Lumbar Region  No depletion  Buccal Region  No depletion  Temple Region  Moderate depletion  Clavicle Bone Region  Mild depletion  Clavicle and Acromion Bone Region  Mild depletion  Scapular Bone Region  Mild depletion  Dorsal Hand  Mild depletion  Patellar Region  No depletion  Anterior Thigh Region  No depletion  Posterior Calf Region  No depletion  Edema (RD Assessment)  Moderate  Hair  Reviewed  Eyes  Reviewed  Mouth  Reviewed  Skin  Reviewed  Nails  Reviewed       Diet Order:   Diet Order  Diet heart healthy/carb modified Room service appropriate? Yes; Fluid consistency: Thin  Diet effective now          EDUCATION NEEDS:   Education needs have been addressed  Skin:  Skin Assessment: Reviewed RN Assessment  Last BM:  03/06/18  Height:   Ht Readings from Last 1 Encounters:  03/06/18 5\' 1"  (1.549 m)    Weight:   Wt Readings from Last 1 Encounters:  03/07/18 140 lb 12.8 oz (63.9 kg)    Ideal Body Weight:  47.7 kg  BMI:  Body mass index is 26.6 kg/m.  Estimated Nutritional Needs:   Kcal:  1700-1900  Protein:  85-100  grams  Fluid:  1.7-1.9 L    Becker Christopher A. Jimmye Norman, RD, LDN, CDE Pager: [phone] After hours Pager: [phone]

## 2018-03-07 NOTE — Progress Notes (Signed)
Chaplain presented to the patient's room to provide documentation for an Advance Directive. The patient was sleeping at the time of this visit. I spoke to the assigned Nurse and informed that the patient's son in normally present to assist with the patient's care, she advised that I  leave the AD for the son to assist the patient. It was advised to contact the Spiritual Office if assistance is needed. Chaplain Yaakov Guthrie [phone]

## 2018-03-07 NOTE — Progress Notes (Signed)
PROGRESS NOTE    [name]  TSV:[id number] DOB: [birth year] DOA: 03/06/2018 PCP: [name], MD  Brief Narrative:  77 y.o. female with medical history significant for hypertension, chronic grade 3 diastolic heart failure, deficiency anemia, chronic atrial fibrillation chronic status post AVR on warfarin, diabetes on oral agents.  Patient was discharged on 5/28 after protracted hospitalization in the context of calculus cholecystitis status post cholecystectomy.  Surgery was delayed in the context of supratherapeutic INR and need for vitamin K reversal.  Anticoagulation was resumed on 5/24 on the afternoon of surgery.  Patient was noted with lower extremity edema and chest x-ray positive for heart failure 5/27 and was given an IV Lasix dose of 20 mg x 1.  Patient was not on oxygen prior to admission but was discharged home on 2 L oxygen.  Patient reports over the past 24 hours she has noted increased shortness of breath, productive cough of clear to pinkish tinged sputum.  And worsening lower extremity edema.  Of note weight at time of admission on 5/17 was 139 lbs.  Today's weight is 149 lbs.  Chest x-ray in the ER consistent with CHF and BNP elevated at 791.  Patient has been given a dose of Lasix 60 mg IV x1 in the ER noting home dose of 60 mg daily. ED Course:  Vital Signs: BP (!) 147/104   Pulse 74   Temp 98.3 F (36.8 C) (Skin)   Resp 18   Ht 5\' 1"  (1.549 m)   Wt 67.6 kg (149 lb)   SpO2 93%   BMI 28.15 kg/m  CXR: CHF with venous hypertension and interstitial edema, large and effusions Lab data: Na 131 , potassium 3.9, chloride 100, CO2 24, glucose 132, BUN 15, creatinine 0.91, anion gap 7, BNP 791, point-of-care troponin normal, white count 2600 differential not obtained, hemoglobin 9.7, platelets 199,000-INR 4.58 Medications and treatments: Lasix 60 mg IV x1     Assessment & Plan:   Principal Problem:   Acute on chronic diastolic heart failure (HCC) Active Problems:  Diabetes mellitus type 2, controlled (HCC)   Chronic atrial fibrillation (HCC)   S/P cholecystectomy   HTN (hypertension)   H/O mechanical aortic valve replacement   Acute hyponatremia   Acute respiratory failure with hypoxia (HCC)   Moderate to severe pulmonary hypertension (HCC)  1] acute on chronic hypoxic respiratory failure secondary to CHF diastolic exacerbation patient admitted with increasing shortness of breath increasing edema chest pressure and orthopnea. chest x-ray findings consistent with heart failure and pulmonary edema elevated BNP.  Continue IV Lasix and potassium supplementation.  Will not repeat echo recent echo was ejection fraction 55 to 60% with pulmonary hypertension 88 mmHg.  She is negative by 1400 cc since admit.  She has lost lost 5 pounds overnight.  2] status post recent cholecystectomy 5/24 /19-continues to have postprandial diarrhea continue Florastor add cholestyramine.  3] chronic atrial fibrillation / mechanical aortic valve replacement.  Continue beta-blocker.  Patient is on Coumadin with supratherapeutic INR pharmacy following.  4] hypertension continue Avapro and metoprolol.  5] type 2 diabetes continue glipizide metformin being on hold continue SSI.  6] hyperlipidemia continue Lipitor.   DVT prophylaxis:coumadin Code Status: partial Family Communication: none Disposition Plan:  tbd Consultants: none  Procedures:none Antimicrobials none Subjective: Patient resting in bed feeling better she reports her breathing is better since admission.     Objective: Vitals:   03/06/18 1648 03/06/18 1947 03/07/18 0447 03/07/18 0451  BP: (!) 150/50 [name])  150/56  (!) 142/56  Pulse: 67 69  71  Resp: 18 18  18   Temp: 97.9 F (36.6 C) 98.4 F (36.9 C)  97.7 F (36.5 C)  TempSrc: Oral Oral  Oral  SpO2: 96% 99%  96%  Weight: 65.9 kg (145 lb 4.8 oz)  63.9 kg (140 lb 12.8 oz)   Height: 5\' 1"  (1.549 m)       Intake/Output Summary (Last 24 hours) at  03/07/2018 0955 Last data filed at 03/07/2018 0939 Gross per 24 hour  Intake 240 ml  Output 1700 ml  Net -1460 ml   Filed Weights   03/06/18 1112 03/06/18 1648 03/07/18 0447  Weight: 67.6 kg (149 lb) 65.9 kg (145 lb 4.8 oz) 63.9 kg (140 lb 12.8 oz)    Examination:  General exam: Appears calm and comfortable  Respiratory system: Clear to auscultation. Respiratory effort normal. Cardiovascular system: S1 & S2 heard, RRR. No JVD, murmurs, rubs, gallops or clicks. No pedal edema. Gastrointestinal system: Abdomen is nondistended, soft and nontender. No organomegaly or masses felt. Normal bowel sounds heard. Central nervous system: Alert and oriented. No focal neurological deficits. Extremities: Symmetric 5 x 5 power. Skin: No rashes, lesions or ulcers Psychiatry: Judgement and insight appear normal. Mood & affect appropriate.     Data Reviewed: I have personally reviewed following labs and imaging studies  CBC: Recent Labs  Lab 03/02/18 0422 03/03/18 0341 03/04/18 0302 03/05/18 0702 03/06/18 1128  WBC 4.3 5.7 5.9 4.1 2.6*  HGB 9.6* 9.8* 10.5* 9.5* 9.7*  HCT 31.9* 32.9* 34.7* 31.8* 31.9*  MCV 76.0* 75.6* 76.3* 75.7* 75.8*  PLT 184 212 242 180 818   Basic Metabolic Panel: Recent Labs  Lab 03/02/18 0422 03/04/18 0302 03/05/18 0702 03/06/18 1128 03/07/18 0433  NA 134* 132* 133* 131* 134*  K 3.5 4.6 4.1 3.9 3.9  CL 103 102 101 100* 101  CO2 22 21* 25 24 26   GLUCOSE 103* 120* 123* 132* 89  BUN 12 17 18 15 15   CREATININE 0.99 0.97 1.03* 0.91 0.87  CALCIUM 7.8* 8.3* 8.3* 8.4* 8.5*   GFR: Estimated Creatinine Clearance: 44.9 mL/min (by C-G formula based on SCr of 0.87 mg/dL). Liver Function Tests: Recent Labs  Lab 03/01/18 0247 03/02/18 0422  AST 28 40  ALT 12* 15  ALKPHOS 75 69  BILITOT 0.9 1.0  PROT 6.0* 5.8*  ALBUMIN 2.2* 2.0*   No results for input(s): LIPASE, AMYLASE in the last 168 hours. No results for input(s): AMMONIA in the last 168  hours. Coagulation Profile: Recent Labs  Lab 03/03/18 0341 03/04/18 0302 03/05/18 0702 03/06/18 1602 03/07/18 0433  INR 1.64 2.36 3.54 4.58* 5.80*   Cardiac Enzymes: Recent Labs  Lab 03/06/18 1803 03/06/18 2328  TROPONINI <0.03 <0.03   BNP (last 3 results) No results for input(s): PROBNP in the last 8760 hours. HbA1C: No results for input(s): HGBA1C in the last 72 hours. CBG: Recent Labs  Lab 03/04/18 2153 03/05/18 0743 03/06/18 1535 03/06/18 2244 03/07/18 0727  GLUCAP 141* 118* 106* 95 83   Lipid Profile: No results for input(s): CHOL, HDL, LDLCALC, TRIG, CHOLHDL, LDLDIRECT in the last 72 hours. Thyroid Function Tests: No results for input(s): TSH, T4TOTAL, FREET4, T3FREE, THYROIDAB in the last 72 hours. Anemia Panel: No results for input(s): VITAMINB12, FOLATE, FERRITIN, TIBC, IRON, RETICCTPCT in the last 72 hours. Sepsis Labs: No results for input(s): PROCALCITON, LATICACIDVEN in the last 168 hours.  Recent Results (from the past 240 hour(s))  Surgical pcr  screen     Status: None   Collection Time: 03/01/18  8:06 AM  Result Value Ref Range Status   MRSA, PCR NEGATIVE NEGATIVE Final   Staphylococcus aureus NEGATIVE NEGATIVE Final    Comment: (NOTE) The Xpert SA Assay (FDA approved for NASAL specimens in patients 64 years of age and older), is one component of a comprehensive surveillance program. It is not intended to diagnose infection nor to guide or monitor treatment. Performed at [facility], [place] [address] [place], [place] [address]          Radiology Studies: Dg Chest 2 View  Result Date: 03/06/2018 CLINICAL DATA:  Chest pain since yesterday. Swelling and weight gain. History of congestive heart failure. EXAM: CHEST - 2 VIEW COMPARISON:  03/04/2018 and multiple previous FINDINGS: Bilateral effusions continue to enlarge, associated with more basilar atelectasis. There is chronic cardiomegaly. The patient has had previous median sternotomy  and CABG. There is aortic atherosclerosis. There is venous hypertension with interstitial edema. Calcified granulomas as seen previously. No acute bone finding. IMPRESSION: Congestive heart failure. Enlarging effusions with basilar atelectasis. Venous hypertension and interstitial edema. Electronically Signed   By: Nelson Chimes M.D.   On: 03/06/2018 11:53        Scheduled Meds: . ALPRAZolam  0.25-0.5 mg Oral See admin instructions  . aspirin EC  81 mg Oral Q M,W,F  . atorvastatin  80 mg Oral Q supper  . cholecalciferol  1,000 Units Oral Daily  . feeding supplement (ENSURE ENLIVE)  237 mL Oral BID BM  . furosemide  60 mg Intravenous Q12H  . insulin aspart  0-5 Units Subcutaneous QHS  . insulin aspart  0-9 Units Subcutaneous TID WC  . irbesartan  300 mg Oral Daily  . magnesium oxide  400 mg Oral Daily  . metoprolol tartrate  50 mg Oral BID  . pantoprazole  40 mg Oral Daily  . potassium chloride  20 mEq Oral BID  . saccharomyces boulardii  250 mg Oral BID  . sodium chloride flush  3 mL Intravenous Q12H  . Warfarin - Pharmacist Dosing Inpatient   Does not apply q1800   Continuous Infusions: . sodium chloride       LOS: 1 day        [name], MD   If 7PM-7AM, please contact night-coverage [email or web address] Password [email or web address] 03/07/2018, 9:55 AM

## 2018-03-07 NOTE — Progress Notes (Signed)
Pt family would like to speak with doctor [name], Family Member [employer] [phone]

## 2018-03-07 NOTE — Telephone Encounter (Signed)
Returned call to patient, she had echo scheduled for 6/3 but she needs to cancel because she is currently in the hospital.  She also states she just had echo in the hospital and wanted to make sure she needed another.   Advised patient she had echo 5/19 and should not need another at this time.   Echo cancelled. Advised follow up appt will be made at time or discharge.  Patient aware and verbalized understanding.

## 2018-03-07 NOTE — Consult Note (Signed)
   [facility] CM Inpatient Consult   03/07/2018  [name] 07-15-1941 [id number]  Patient is assessed for a readmission x1 day post hospital just active with [place] Management prior to her recent transition to home from a lap choley and now admitted with HF.  Patient has "NOT" been engaged by a [employer] at this point.  Patient returned 1 day after hospital transition to home.   Made Inpatient Case Manager aware that [facility] just was ordered and will be following. Patient noted to be living alone.  Had [place] noted in past hospital notes. Patient was discussed in progression meeting and will notify newly assigned [place] Coordinator of new events.  Patient's primary care provider is Dr. Domenick Gong.  This office is listed to provide the transition of care follow up.  Will follow for progress and needs. Of note, [facility] Care Management services does not replace or interfere with any services that are needed or arranged by inpatient case management or social work.  For additional questions or referrals please contact:  Natividad Brood, RN BSN [facility] Liaison  [phone] business mobile phone Toll free office [phone]

## 2018-03-07 NOTE — Telephone Encounter (Signed)
New message:       Pt is calling in reference to her echocardiogram. Pt states she is currently in the hospital but she has some questions.

## 2018-03-07 NOTE — Progress Notes (Signed)
[place] for warfarin Indication: mechanical valve  Allergies  Allergen Reactions  . Flagyl [Metronidazole] Rash    ALL-OVER BODY RASH  . Coreg [Carvedilol] Other (See Comments)    Terrible cramping in the feet and had a lot of bowel movements, but not diarrhea  . Losartan Swelling    Patient doesn't recall site of swelling  . Spironolactone Nausea Only    Caused extreme nausea  . Verapamil Hives  . Zetia [Ezetimibe] Other (See Comments)    Reaction not recalled  . Zocor [Simvastatin - High Dose] Other (See Comments)    Reaction not recalled    Patient Measurements: Height: 5\' 1"  (154.9 cm) Weight: 140 lb 12.8 oz (63.9 kg) IBW/kg (Calculated) : 47.8  Vital Signs: Temp: 97.7 F (36.5 C) (05/30 0451) Temp Source: Oral (05/30 0451) BP: 142/56 (05/30 0451) Pulse Rate: 71 (05/30 0451)  Labs: Recent Labs    03/04/18 1243 03/05/18 0702 03/05/18 0706 03/06/18 1128 03/06/18 1602 03/06/18 1803 03/06/18 2328 03/07/18 0433  HGB  --  9.5*  --  9.7*  --   --   --   --   HCT  --  31.8*  --  31.9*  --   --   --   --   PLT  --  180  --  199  --   --   --   --   LABPROT  --  35.2*  --   --  43.0*  --   --  51.7*  INR  --  3.54  --   --  4.58*  --   --  5.80*  HEPARINUNFRC 0.49  --  0.47  --   --   --   --   --   CREATININE  --  1.03*  --  0.91  --   --   --  0.87  TROPONINI  --   --   --   --   --  <0.03 <0.03  --     Estimated Creatinine Clearance: 44.9 mL/min (by C-G formula based on SCr of 0.87 mg/dL).   Medical History: Past Medical History:  Diagnosis Date  . Aortic atherosclerosis ([place]) 01/23/2018  . Atrial fibrillation ([place])   . Carotid artery disease ([place])   . Cholelithiasis 01/23/2018  . Chronic anticoagulation   . Coronary artery disease    status post coronary artery bypass grafting times 07/10/2004  . Diabetes mellitus   . Hypercholesteremia   . Hypertension   . Mechanical heart valve present    H. aortic valve  replacement at the time of bypass surgery October 2005  . Moderate to severe pulmonary hypertension ([place])   . Peripheral arterial disease (HCC)    history of left common iliac artery PTA and stenting for a chronic total occlusion 08/26/01    Assessment: 56 yof just discharged yesterday presents with worsening SOB. She is on chronic coumadin for history of a mechanical valve.   INR continues to be supratherapeutic >5 today. No cbc this am, will check in am with elevated INR. No bleeding noted.   Goal of Therapy:  INR 2.5-3.5 Monitor platelets by anticoagulation protocol: Yes   Plan:  No warfarin tonight Daily INR Check cbc in am  [name] Hearing PharmD., BCPS Clinical Pharmacist 03/07/2018 8:46 AM

## 2018-03-07 NOTE — Patient Outreach (Signed)
[facility]) Care Management [place] Coordination  03/07/2018  ZHAVIA CUNANAN 03/09/1941 [id number]  Care Coordination outreach re: Jordyan Hardiman, 77 y/o female referred to [facility] after recent hospitalization May 16-28, 2019 for abdominal pain, nausea/ vomiting/ diarrhea; patient has lap chole on Mar 01, 2018 and was discharged home to self-care with home health services for RN/ PT through [place] and DME for (new) home O2 through [place] care.  Patient has history including, but not limited to, HTN/ HLD; DM; GERD; A-fib; AVR on ACT; PVD; CHF; CAD; and anxiety.  [facility] RN CM received notification via secure messaging through EMR from [place] that patient had re-admitted to hospital Mar 06, 2018 with shortness of breath, hypoxia, pulmonary edema, CHF exacerbation  Plan:  [place] RN CM will follow patient's progress while hospitalized and collaborate with [facility] liaison's for discharge disposition.  Oneta Rack, RN, BSN, [employer] [facility] Care Management  [phone]

## 2018-03-07 NOTE — Care Management Note (Signed)
Case Management Note  Patient Details  Name: [name] MRN: [id number] Date of Birth: 11-May-1941  Subjective/Objective:   CHF, readmission                 Action/Plan: Pt HH was arrange with [facility]. [facility] aware of admission.  Will need HH RN and PT orders with [facility]. Pt has oxygen with [facility]. Will continue to follow for dc needs.   Expected Discharge Date:                  Expected Discharge Plan:  [place]  In-House Referral:  NA  Discharge planning Services  CM Consult  Post Acute Care Choice:  Home Health, Resumption of Svcs/PTA Provider Choice offered to:  Patient  DME Arranged:  N/A DME Agency:  NA  HH Arranged:  RN, PT [place] Agency:  [facility]  Status of Service:  Completed, signed off  If discussed at [place] of Stay Meetings, dates discussed:    Additional Comments:  Erenest Rasher, RN 03/07/2018, 8:41 PM

## 2018-03-08 DIAGNOSIS — Z952 Presence of prosthetic heart valve: Secondary | ICD-10-CM

## 2018-03-08 DIAGNOSIS — I482 Chronic atrial fibrillation: Secondary | ICD-10-CM

## 2018-03-08 DIAGNOSIS — I5033 Acute on chronic diastolic (congestive) heart failure: Secondary | ICD-10-CM

## 2018-03-08 LAB — BASIC METABOLIC PANEL
Anion gap: 8 (ref 5–15)
BUN: 13 mg/dL (ref 6–20)
CALCIUM: 8.6 mg/dL — AB (ref 8.9–10.3)
CO2: 28 mmol/L (ref 22–32)
Chloride: 95 mmol/L — ABNORMAL LOW (ref 101–111)
Creatinine, Ser: 0.88 mg/dL (ref 0.44–1.00)
GFR calc Af Amer: >60 mL/min (ref >60)
GLUCOSE: 125 mg/dL — AB (ref 65–99)
Potassium: 3.9 mmol/L (ref 3.5–5.1)
SODIUM: 131 mmol/L — AB (ref 135–145)

## 2018-03-08 LAB — PROTIME-INR
INR: 3.88
PROTHROMBIN TIME: 37.8 s — AB (ref 11.4–15.2)

## 2018-03-08 LAB — GLUCOSE, CAPILLARY
GLUCOSE-CAPILLARY: 128 mg/dL — AB (ref 65–99)
Glucose-Capillary: 140 mg/dL — ABNORMAL HIGH (ref 65–99)
Glucose-Capillary: 116 mg/dL — ABNORMAL HIGH (ref 65–99)
Glucose-Capillary: 116 mg/dL — ABNORMAL HIGH (ref 65–99)

## 2018-03-08 MED ORDER — WARFARIN SODIUM 2.5 MG PO TABS
2.5000 mg | ORAL_TABLET | Freq: Once | ORAL | Status: AC
  Administered 2018-03-08: 2.5 mg via ORAL
  Filled 2018-03-08: qty 1

## 2018-03-08 MED ORDER — FUROSEMIDE 10 MG/ML IJ SOLN
80.0000 mg | Freq: Two times a day (BID) | INTRAMUSCULAR | Status: DC
  Administered 2018-03-08 – 2018-03-13 (×10): 80 mg via INTRAVENOUS
  Filled 2018-03-08 (×11): qty 8

## 2018-03-08 NOTE — Plan of Care (Signed)
  Problem: Education: Goal: Knowledge of General Education information will improve Outcome: Progressing   Problem: Health Behavior/Discharge Planning: Goal: Ability to manage health-related needs will improve Outcome: Progressing   Problem: Clinical Measurements: Goal: Ability to maintain clinical measurements within normal limits will improve Outcome: Progressing Goal: Will remain free from infection Outcome: Progressing Goal: Diagnostic test results will improve Outcome: Progressing Goal: Respiratory complications will improve Outcome: Progressing Goal: Cardiovascular complication will be avoided Outcome: Progressing   Problem: Clinical Measurements: Goal: Ability to maintain clinical measurements within normal limits will improve Outcome: Progressing

## 2018-03-08 NOTE — Progress Notes (Signed)
ANTICOAGULATION CONSULT NOTE - Follow Up Consult  Pharmacy Consult for Coumadin Indication: atrial fibrillation and mechanical AVR  Allergies  Allergen Reactions  . Flagyl [Metronidazole] Rash    ALL-OVER BODY RASH  . Coreg [Carvedilol] Other (See Comments)    Terrible cramping in the feet and had a lot of bowel movements, but not diarrhea  . Losartan Swelling    Patient doesn't recall site of swelling  . Spironolactone Nausea Only    Caused extreme nausea  . Verapamil Hives  . Zetia [Ezetimibe] Other (See Comments)    Reaction not recalled  . Zocor [Simvastatin - High Dose] Other (See Comments)    Reaction not recalled    Patient Measurements: Height: 5\' 1"  (154.9 cm) Weight: 136 lb (61.7 kg) IBW/kg (Calculated) : 47.8 Heparin Dosing Weight: 60 kg  Vital Signs: Temp: 98.2 F (36.8 C) (05/31 0600) Temp Source: Oral (05/31 0600) BP: 169/47 (05/31 0600)  Labs: Recent Labs    03/06/18 1128 03/06/18 1602 03/06/18 1803 03/06/18 2328 03/07/18 0433 03/08/18 0430  HGB 9.7*  --   --   --   --   --   HCT 31.9*  --   --   --   --   --   PLT 199  --   --   --   --   --   LABPROT  --  43.0*  --   --  51.7* 37.8*  INR  --  4.58*  --   --  5.80* 3.88  CREATININE 0.91  --   --   --  0.87 0.88  TROPONINI  --   --  <0.03 <0.03  --   --     Estimated Creatinine Clearance: 43.7 mL/min (by C-G formula based on SCr of 0.88 mg/dL).   Medications:  Medications Prior to Admission  Medication Sig Dispense Refill Last Dose  . acetaminophen (TYLENOL) 325 MG tablet Take 2 tablets (650 mg total) by mouth every 6 (six) hours as needed for mild pain, fever or headache (for pain or headaches).   Past Week at Unknown time  . ALPRAZolam (XANAX) 0.5 MG tablet Take 0.25-0.5 mg by mouth See admin instructions. Take 0.25-0.5 mg two times a day as needed for anxiety and 0.25-0.5 mg at bedtime scheduled   Past Week at Unknown time  . aspirin EC 81 MG tablet Take 81 mg by mouth every Monday,  Wednesday, and Friday.    Past Week at Unknown time  . atorvastatin (LIPITOR) 80 MG tablet Take 1 tablet (80 mg total) by mouth daily. (Patient taking differently: Take 80 mg by mouth daily with supper. ) 30 tablet 0 Past Week at Unknown time  . Cholecalciferol (VITAMIN D3) 1000 UNITS CAPS Take 1,000 Units by mouth daily.    Past Week at Unknown time  . ferrous sulfate 325 (65 FE) MG tablet Take 1 tablet (325 mg total) by mouth 2 (two) times daily with a meal. 60 tablet 3 Past Week at Unknown time  . furosemide (LASIX) 20 MG tablet Take 3 tablets (60 mg total) by mouth daily. 90 tablet 2 Past Week at Unknown time  . glipiZIDE (GLUCOTROL XL) 2.5 MG 24 hr tablet Take 2.5 mg by mouth daily.    Past Week at Unknown time  . hydrocortisone cream 1 % Apply topically 2 (two) times daily. 30 g 0 Past Week at Unknown time  . irbesartan (AVAPRO) 300 MG tablet Take 1 tablet (300 mg total) by mouth daily. [place]  tablet 0 Past Week at Unknown time  . magnesium oxide (MAG-OX) 400 (241.3 Mg) MG tablet Take 1 tablet (400 mg total) by mouth daily.   Past Week at Unknown time  . metFORMIN (GLUCOPHAGE) 500 MG tablet Take 500 mg by mouth 2 (two) times daily with a meal.    Past Week at Unknown time  . metoprolol tartrate (LOPRESSOR) 50 MG tablet Take 50 mg by mouth 2 (two) times daily.   Past Week at Unknown time  . ondansetron (ZOFRAN ODT) 4 MG disintegrating tablet 4mg  ODT q6-8 hours prn nausea/vomit 12 tablet 0 Past Week at Unknown time  . pantoprazole (PROTONIX) 40 MG tablet Take 40 mg by mouth daily.    Past Week at Unknown time  . Polyethyl Glyc-Propyl Glyc PF (SYSTANE PRESERVATIVE FREE) 0.4-0.3 % SOLN Place 1-2 drops into both eyes 3 (three) times daily as needed (for irritation).    Past Week at Unknown time  . potassium chloride SA (K-DUR,KLOR-CON) 20 MEQ tablet Take 1 tablet (20 mEq total) by mouth daily. 30 tablet 0 Past Week at Unknown time  . saccharomyces boulardii (FLORASTOR) 250 MG capsule Take 1 capsule (250  mg total) by mouth 2 (two) times daily. 60 capsule 1 Past Week at Unknown time  . traMADol (ULTRAM) 50 MG tablet Take 1 tablet (50 mg total) by mouth every 6 (six) hours as needed for moderate pain. 15 tablet 0 Past Week at Unknown time  . vitamin B-12 (CYANOCOBALAMIN) 100 MCG tablet Take 100 mcg by mouth daily.    Past Week at Unknown time  . warfarin (COUMADIN) 5 MG tablet TAKE 1 TO 1.5 (ONE TO ONE & ONE-HALF) TABLETS BY MOUTH DAILY AS DIRECTED BY COUMADIN CLINIC (Patient taking differently: Take 5 mg by mouth in the morning on Sun/Tues/Thurs/Sat and 7.5 mg on Mon/Wed/Fri) 135 tablet 1 Past Week at Unknown time    Assessment: 77 yo F on Coumadin PTA for mechanical AVR and afib.  Coumadin was held last week for cholecystectomy.  Pt was discharged 5/28 s/p cholecystectomy. Returned to ED with SOB.  Noted to have elevated INR on presentation.  Suspect elevated INR is a combination of higher doses given during last admission and decreased PO intake since procedure.  INR has trended down towards goal.  Last Coumadin dose was 5/28.  Will give low dose Coumadin tonight in anticipation of INR to further trend down.  Goal of Therapy:  INR 2.5-3.5 Monitor platelets by anticoagulation protocol: Yes   Plan:  Coumadin 2.5mg  PO x 1 tonight Continue daily INR  [employer], Pharm.D., BCPS Clinical Pharmacist Pager: [phone] Clinical phone for 03/08/2018 from 8:30-4:00 is [phone]. After 4pm, please call Main Rx [phone]) for assistance. 03/08/2018 11:46 AM

## 2018-03-08 NOTE — Progress Notes (Addendum)
PROGRESS NOTE    [name]  SVX:[id number] DOB: Aug 04, 1941 DOA: 03/06/2018 PCP: [name], MD  Brief Narrative:77 y.o.femalewith medical history significant forhypertension, chronic grade 3 diastolic heart failure, deficiency anemia, chronic atrial fibrillation chronic status post AVR on warfarin, diabetes on oral agents. Patient was discharged on 5/28 after protracted hospitalization in the context of calculus cholecystitis status post cholecystectomy. Surgery was delayed in the context of supratherapeutic INR and need for vitamin K reversal. Anticoagulation was resumed on 5/24 on the afternoon of surgery. Patient was noted with lower extremity edema and chest x-ray positive for heart failure 5/27 and was given an IV Lasix dose of 20 mg x 1. Patient was not on oxygen priorto admission but was discharged home on 2 L oxygen. Patient reports over the past 24 hours she has noted increased shortness of breath, productive cough of clear to pinkish tinged sputum. And worsening lower extremity edema. Of note weight at time of admission on 5/17 was 139 lbs. Today's weight is 149 lbs. Chest x-ray in the ER consistent with CHF and BNP elevated at 791. Patient has been given a dose of Lasix 60 mg IV x1 in the ER noting home dose of 60 mg daily. ED Course: Vital Signs:BP (!) 147/104  Pulse 74  Temp 98.3 F (36.8 C) (Skin)  Resp 18  Ht 5\' 1"  (1.549 m)  Wt 67.6 kg (149 lb)  SpO2 93%  BMI 28.15 kg/m  CXR:CHF with venous hypertension and interstitial edema,large and effusions Lab data:Na 131, potassium 3.9, chloride 100, CO2 24, glucose 132, BUN 15, creatinine 0.91, anion gap 7, BNP 791, point-of-care troponin normal, white count 2600 differential not obtained, hemoglobin 9.7, platelets 199,000-INR 4.58 Medications and treatments:Lasix 60 mg IV x1     Assessment & Plan:   Principal Problem:   Acute on chronic diastolic heart failure (HCC) Active Problems:  Diabetes mellitus type 2, controlled (HCC)   Chronic atrial fibrillation (HCC)   S/P cholecystectomy   HTN (hypertension)   H/O mechanical aortic valve replacement   Acute hyponatremia   Acute respiratory failure with hypoxia (HCC)   Moderate to severe pulmonary hypertension ([place])  1] acute on chronic hypoxic respiratory failure secondary to CHF diastolic exacerbation patient admitted with increasing shortness of breath increasing edema chest pressure and orthopnea. chest x-ray findings consistent with heart failure and pulmonary edema elevated BNP.  Continue IV Lasix and potassium supplementation.  Continue ACE inhibitor and beta-blocker.  Monitor renal functions.  Her weight has come down from 146 to 135 pounds. Will not repeat echo recent echo was ejection fraction 55 to 60% with pulmonary hypertension 88 mmHg.  She is negative by 2800 cc since admit.  She has lost lost 4 pounds overnight.  2] status post recent cholecystectomy 5/24 /19-continues to have postprandial diarrhea continue Florastor add cholestyramine.  Better.  3] chronic atrial fibrillation / mechanical aortic valve replacement.  Continue beta-blocker.  Patient is on Coumadin with supratherapeutic INR pharmacy following.  INR down to 3.80 pharmacy following.  Patient very concerned about erratic INR levels.  4] hypertension continue Avapro and metoprolol.  5] type 2 diabetes continue glipizide metformin being on hold continue SSI.  6] hyperlipidemia continue Lipitor.  7] moderate malnutrition secondary to chronic illness.  Followed by nutritionist.  Evidenced by fat depletion and edema.  Continue with multivitamin and minerals daily.  Glucerna and Prostat.   DVT prophylaxis: Coumadin Code Status: Partial code Family Communication: No family available. Disposition Plan: Discharge in 31  to 72 hours.  Consultants:  Placed cardiology consult today.  Procedures: None Antimicrobials None Subjective: Concerned about  why she is not feeling better fast.  Sitting up in her bed with legs being down.  Edema noted in both legs.   Objective: Vitals:   03/07/18 2246 03/07/18 2300 03/08/18 0000 03/08/18 0600  BP:  (!) 154/49  (!) 169/47  Pulse: 69 (!) 59    Resp:  17  20  Temp:   97.7 F (36.5 C) 98.2 F (36.8 C)  TempSrc:   Oral Oral  SpO2:  98%  98%  Weight:    61.7 kg (136 lb)  Height:        Intake/Output Summary (Last 24 hours) at 03/08/2018 1051 Last data filed at 03/08/2018 0939 Gross per 24 hour  Intake 360 ml  Output 3250 ml  Net -2890 ml   Filed Weights   03/06/18 1648 03/07/18 0447 03/08/18 0600  Weight: 65.9 kg (145 lb 4.8 oz) 63.9 kg (140 lb 12.8 oz) 61.7 kg (136 lb)    Examination:  General exam: Appears calm and comfortable  Respiratory system: Decreased breath sounds at the bases auscultation. Respiratory effort normal. Cardiovascular system: S1 & S2 heard, RRR. No JVD, murmurs, rubs, gallops or clicks. No pedal edema. Gastrointestinal system: Abdomen is nondistended, soft and nontender. No organomegaly or masses felt. Normal bowel sounds heard. Central nervous system: Alert and oriented. No focal neurological deficits. Extremities: 3+ pitting edema bilaterally  skin: No rashes, lesions or ulcers Psychiatry: Judgement and insight appear normal. Mood & affect appropriate.     Data Reviewed: I have personally reviewed following labs and imaging studies  CBC: Recent Labs  Lab 03/02/18 0422 03/03/18 0341 03/04/18 0302 03/05/18 0702 03/06/18 1128  WBC 4.3 5.7 5.9 4.1 2.6*  HGB 9.6* 9.8* 10.5* 9.5* 9.7*  HCT 31.9* 32.9* 34.7* 31.8* 31.9*  MCV 76.0* 75.6* 76.3* 75.7* 75.8*  PLT 184 212 242 180 086   Basic Metabolic Panel: Recent Labs  Lab 03/04/18 0302 03/05/18 0702 03/06/18 1128 03/07/18 0433 03/08/18 0430  NA 132* 133* 131* 134* 131*  K 4.6 4.1 3.9 3.9 3.9  CL 102 101 100* 101 95*  CO2 21* 25 24 26 28   GLUCOSE 120* 123* 132* 89 125*  BUN 17 18 15 15 13     CREATININE 0.97 1.03* 0.91 0.87 0.88  CALCIUM 8.3* 8.3* 8.4* 8.5* 8.6*   GFR: Estimated Creatinine Clearance: 43.7 mL/min (by C-G formula based on SCr of 0.88 mg/dL). Liver Function Tests: Recent Labs  Lab 03/02/18 0422  AST 40  ALT 15  ALKPHOS 69  BILITOT 1.0  PROT 5.8*  ALBUMIN 2.0*   No results for input(s): LIPASE, AMYLASE in the last 168 hours. No results for input(s): AMMONIA in the last 168 hours. Coagulation Profile: Recent Labs  Lab 03/04/18 0302 03/05/18 0702 03/06/18 1602 03/07/18 0433 03/08/18 0430  INR 2.36 3.54 4.58* 5.80* 3.88   Cardiac Enzymes: Recent Labs  Lab 03/06/18 1803 03/06/18 2328  TROPONINI <0.03 <0.03   BNP (last 3 results) No results for input(s): PROBNP in the last 8760 hours. HbA1C: No results for input(s): HGBA1C in the last 72 hours. CBG: Recent Labs  Lab 03/07/18 0727 03/07/18 1139 03/07/18 1624 03/07/18 2129 03/08/18 0758  GLUCAP 83 118* 139* 140* 128*   Lipid Profile: No results for input(s): CHOL, HDL, LDLCALC, TRIG, CHOLHDL, LDLDIRECT in the last 72 hours. Thyroid Function Tests: No results for input(s): TSH, T4TOTAL, FREET4, T3FREE, THYROIDAB in the  last 72 hours. Anemia Panel: No results for input(s): VITAMINB12, FOLATE, FERRITIN, TIBC, IRON, RETICCTPCT in the last 72 hours. Sepsis Labs: No results for input(s): PROCALCITON, LATICACIDVEN in the last 168 hours.  Recent Results (from the past 240 hour(s))  Surgical pcr screen     Status: None   Collection Time: 03/01/18  8:06 AM  Result Value Ref Range Status   MRSA, PCR NEGATIVE NEGATIVE Final   Staphylococcus aureus NEGATIVE NEGATIVE Final    Comment: (NOTE) The Xpert SA Assay (FDA approved for NASAL specimens in patients 56 years of age and older), is one component of a comprehensive surveillance program. It is not intended to diagnose infection nor to guide or monitor treatment. Performed at [facility], [place] [address] [place], [place] [address]           Radiology Studies: Dg Chest 2 View  Result Date: 03/06/2018 CLINICAL DATA:  Chest pain since yesterday. Swelling and weight gain. History of congestive heart failure. EXAM: CHEST - 2 VIEW COMPARISON:  03/04/2018 and multiple previous FINDINGS: Bilateral effusions continue to enlarge, associated with more basilar atelectasis. There is chronic cardiomegaly. The patient has had previous median sternotomy and CABG. There is aortic atherosclerosis. There is venous hypertension with interstitial edema. Calcified granulomas as seen previously. No acute bone finding. IMPRESSION: Congestive heart failure. Enlarging effusions with basilar atelectasis. Venous hypertension and interstitial edema. Electronically Signed   By: Nelson Chimes M.D.   On: 03/06/2018 11:53        Scheduled Meds: . ALPRAZolam  0.25-0.5 mg Oral See admin instructions  . aspirin EC  81 mg Oral Q M,W,F  . atorvastatin  80 mg Oral Q supper  . cholecalciferol  1,000 Units Oral Daily  . cholestyramine  4 g Oral TID  . feeding supplement (GLUCERNA SHAKE)  237 mL Oral BID BM  . feeding supplement (PRO-STAT SUGAR FREE 64)  30 mL Oral BID  . furosemide  60 mg Intravenous Q12H  . insulin aspart  0-5 Units Subcutaneous QHS  . insulin aspart  0-9 Units Subcutaneous TID WC  . irbesartan  300 mg Oral Daily  . magnesium oxide  400 mg Oral Daily  . metoprolol tartrate  50 mg Oral BID  . multivitamin with minerals  1 tablet Oral Daily  . pantoprazole  40 mg Oral Daily  . potassium chloride  20 mEq Oral BID  . saccharomyces boulardii  250 mg Oral BID  . sodium chloride flush  3 mL Intravenous Q12H  . Warfarin - Pharmacist Dosing Inpatient   Does not apply q1800   Continuous Infusions: . sodium chloride       LOS: 2 days     [name], MD [facility]  If 7PM-7AM, please contact night-coverage [email or web address] Password [facility] 03/08/2018, 10:51 AM

## 2018-03-08 NOTE — Progress Notes (Signed)
Pt complained about  Room santization Paged for EVS to sanitize Room

## 2018-03-08 NOTE — Consult Note (Addendum)
Cardiology Consultation:   Patient ID: [name]; [id number]; 16-Feb-1941   Admit date: 03/06/2018 Date of Consult: 03/08/2018  Primary Care Provider: [name], MD Primary Cardiologist: Quay Burow, MD   Patient Profile:   [name] is a 77 y.o. female with a hx of CAD, CABG in 07/2004 X2 and AVR with St. Jude valve -on coumadin and PVOD, permanent a fib,  HTN, HLD, and diabetes who is being seen today for the evaluation of CHF at the request of Dr. Rodena Piety.  Last nuc 08/2013 with anteroapical ischemia considered high risk, but Dr. [name] noted in 2006 also ischemia on nuc and cardiac cath revealed patent grafts, false positive study.     Admitted 5/16-5/28 for cholelithiasis and subacute cholecystitis. Seen by cardiologist for pre-op clearance. He was getting > 4 mets of activity. Echo showed LVEF of 55-60%, no wm abnormality, grade 3 DD, normal functioning mechanical aortic valve, RV moderately to severely reduced and PA pressure of 88mg  Hg. Anticoagulation was resumed on 5/24 on the afternoon of surgery (s/p cholecystectomy). However patient had volume overload post surgery requiring diuresis (lower than normal home dose).   History of Present Illness:   [name] presented 03/06/17 (one day after discharge) due to worsening LE edema, SOB and cough. He weight was up 10 lb (149lb) from prior admission (139lb). BNP 791. Patient was admitted for acute hypoxic respiratory failure 2nd to acute diastolic CHF. She is diuresed 4.6L so far with 13lb weight loss (149-->136lb).  Doubt acute.  INR 3.88 today.  Her shortness of breath has improved.  However she complains of fatigue and tiredness.  Renal function and electrolytes are stable.  Past Medical History:  Diagnosis Date  . Aortic atherosclerosis ([place]) 01/23/2018  . Atrial fibrillation ([place])   . Carotid artery disease ([place])   . Cholelithiasis 01/23/2018  . Chronic anticoagulation   . Coronary artery disease    status post  coronary artery bypass grafting times 07/10/2004  . Diabetes mellitus   . Hypercholesteremia   . Hypertension   . Mechanical heart valve present    H. aortic valve replacement at the time of bypass surgery October 2005  . Moderate to severe pulmonary hypertension ([place])   . Peripheral arterial disease ([facility])    history of left common iliac artery PTA and stenting for a chronic total occlusion 08/26/01    Past Surgical History:  Procedure Laterality Date  . AORTIC VALVE REPLACEMENT    . CARDIAC CATHETERIZATION  11/10/2004   40% right common illiac, 70% in stent restenosis of distal left common illiac,   . CARDIAC CATHETERIZATION  05/18/2004   LAD 50-70% midstenosis, RCA dominant w/50% stenosis, 50% Right common Illiac artery ostial stenosis, 90% in stent restenosis within midportion of left common illiac stent  . Carotid Duplex  03/12/2012   RSA-elev. velocities suggestive of a 50-69% diameter reduction, Right&Left Bulb/Prox ICA-mild-mod.fibrous plaqueelevating Velocities abnormal study.  . CHOLECYSTECTOMY N/A 03/01/2018   Procedure: LAPAROSCOPIC CHOLECYSTECTOMY;  Surgeon: Judeth Horn, MD;  Location: [facility];  Service: General;  Laterality: N/A;  . COLONOSCOPY WITH PROPOFOL N/A 01/22/2018   Procedure: COLONOSCOPY WITH PROPOFOL;  Surgeon: Wilford Corner, MD;  Location: [facility];  Service: Endoscopy;  Laterality: N/A;  . ESOPHAGOGASTRODUODENOSCOPY (EGD) WITH PROPOFOL N/A 01/22/2018   Procedure: ESOPHAGOGASTRODUODENOSCOPY (EGD) WITH PROPOFOL;  Surgeon: Wilford Corner, MD;  Location: [facility];  Service: Endoscopy;  Laterality: N/A;  . Lower Ext. Duplex  03/12/2012   Right Proximal CIA- vessel narrowing w/elevated velocities  0-49% diameter reduction. Right SFA-mild mixed density plaque throughout vessel.  [name] NM MYOCAR PERF WALL MOTION  05/19/2010   protocol: Persantine, post stress EF 65%, negative for ischemia, low risk scan  . TRANSTHORACIC ECHOCARDIOGRAM  08/29/2012   Moderately  calcified annulus of mitral valve, moderate regurg. of both mitral valve and tricuspid valve.      Inpatient Medications: Scheduled Meds: . ALPRAZolam  0.25-0.5 mg Oral See admin instructions  . aspirin EC  81 mg Oral Q M,W,F  . atorvastatin  80 mg Oral Q supper  . cholecalciferol  1,000 Units Oral Daily  . cholestyramine  4 g Oral TID  . feeding supplement (GLUCERNA SHAKE)  237 mL Oral BID BM  . feeding supplement (PRO-STAT SUGAR FREE 64)  30 mL Oral BID  . furosemide  60 mg Intravenous Q12H  . insulin aspart  0-5 Units Subcutaneous QHS  . insulin aspart  0-9 Units Subcutaneous TID WC  . irbesartan  300 mg Oral Daily  . magnesium oxide  400 mg Oral Daily  . metoprolol tartrate  50 mg Oral BID  . multivitamin with minerals  1 tablet Oral Daily  . pantoprazole  40 mg Oral Daily  . potassium chloride  20 mEq Oral BID  . saccharomyces boulardii  250 mg Oral BID  . sodium chloride flush  3 mL Intravenous Q12H  . warfarin  2.5 mg Oral ONCE-1800  . Warfarin - Pharmacist Dosing Inpatient   Does not apply q1800   Continuous Infusions: . sodium chloride     PRN Meds: sodium chloride, acetaminophen, guaiFENesin, ondansetron (ZOFRAN) IV, polyvinyl alcohol, sodium chloride flush, traMADol  Allergies:    Allergies  Allergen Reactions  . Flagyl [Metronidazole] Rash    ALL-OVER BODY RASH  . Coreg [Carvedilol] Other (See Comments)    Terrible cramping in the feet and had a lot of bowel movements, but not diarrhea  . Losartan Swelling    Patient doesn't recall site of swelling  . Spironolactone Nausea Only    Caused extreme nausea  . Verapamil Hives  . Zetia [Ezetimibe] Other (See Comments)    Reaction not recalled  . Zocor [Simvastatin - High Dose] Other (See Comments)    Reaction not recalled    Social History:   Social History   Socioeconomic History  . Marital status: Widowed    Spouse name: Not on file  . Number of children: Not on file  . Years of education: Not on file    . Highest education level: Not on file  Occupational History  . Not on file  Social Needs  . Financial resource strain: Not on file  . Food insecurity:    Worry: Not on file    Inability: Not on file  . Transportation needs:    Medical: Not on file    Non-medical: Not on file  Tobacco Use  . Smoking status: Former Smoker    Packs/day: 1.00    Years: 30.00    Pack years: 30.00    Types: Cigarettes  . Smokeless tobacco: Never Used  Substance and Sexual Activity  . Alcohol use: No    Alcohol/week: 0.0 oz  . Drug use: No  . Sexual activity: Not on file  Lifestyle  . Physical activity:    Days per week: Not on file    Minutes per session: Not on file  . Stress: Not on file  Relationships  . Social connections:    Talks on phone: Not on file  Gets together: Not on file    Attends religious service: Not on file    Active member of club or organization: Not on file    Attends meetings of clubs or organizations: Not on file    Relationship status: Not on file  . Intimate partner violence:    Fear of current or ex partner: Not on file    Emotionally abused: Not on file    Physically abused: Not on file    Forced sexual activity: Not on file  Other Topics Concern  . Not on file  Social History Narrative  . Not on file    Family History:   Family History  Problem Relation Age of Onset  . Breast cancer Neg Hx      ROS:  Please see the history of present illness.  All other ROS reviewed and negative.     Physical Exam/Data:   Vitals:   03/07/18 2300 03/08/18 0000 03/08/18 0600 03/08/18 1100  BP: (!) 154/49  (!) 169/47 (!) 130/48  Pulse: (!) 59   (!) 52  Resp: 17  20   Temp:  97.7 F (36.5 C) 98.2 F (36.8 C) 98.6 F (37 C)  TempSrc:  Oral Oral Oral  SpO2: 98%  98% 100%  Weight:   136 lb (61.7 kg)   Height:        Intake/Output Summary (Last 24 hours) at 03/08/2018 1451 Last data filed at 03/08/2018 1338 Gross per 24 hour  Intake 240 ml  Output 2350 ml   Net -2110 ml   Filed Weights   03/06/18 1648 03/07/18 0447 03/08/18 0600  Weight: 145 lb 4.8 oz (65.9 kg) 140 lb 12.8 oz (63.9 kg) 136 lb (61.7 kg)   Body mass index is 25.7 kg/m.  General: Thin frail ill-appearing female in no acute distress HEENT: normal Lymph: no adenopathy Neck: + JVD Endocrine:  No thryomegaly Vascular: No carotid bruits; FA pulses 2+ bilaterally without bruits  Cardiac:  normal S1, S2; irregularly irregular; no murmur   Lungs:  clear to auscultation bilaterally, no wheezing, rhonchi or rales  Abd: soft, nontender, no hepatomegaly  Ext: 2+ bilateral lower extremity edema Musculoskeletal:  No deformities, BUE and BLE strength normal and equal Skin: warm and dry  Neuro:  CNs 2-12 intact, no focal abnormalities noted Psych:  Normal affect   EKG:  The EKG was personally reviewed and demonstrates: Atrial fibrillation rate of 69 bpm Telemetry:  Telemetry was personally reviewed and demonstrates: Atrial fibrillation at rate of 60s  Relevant CV Studies:  Echo 02/2018 Study Conclusions  - Left ventricle: The cavity size was normal. Wall thickness was   normal. Systolic function was normal. The estimated ejection   fraction was in the range of 55% to 60%. Wall motion was normal;   there were no regional wall motion abnormalities. Doppler   parameters are consistent with a reversible restrictive pattern,   indicative of decreased left ventricular diastolic compliance   and/or increased left atrial pressure (grade 3 diastolic   dysfunction). - Ventricular septum: The contour showed diastolic flattening. - Aortic valve: A mechanical prosthesis was present and functioning   normally. Peak velocity (S): 238 cm/s. Mean gradient (S): 10 mm   Hg. Valve area (VTI): 2.24 cm^2. Valve area (Vmax): 2.26 cm^2.   Valve area (Vmean): 1.88 cm^2. - Mitral valve: Calcified annulus. There was mild regurgitation.   Valve area by continuity equation (using LVOT flow): 2.53  cm^2. - Left atrium: The atrium was moderately  to severely dilated. - Right ventricle: The cavity size was moderately dilated. Wall   thickness was normal. Systolic function was moderately to   severely reduced. - Right atrium: The atrium was severely dilated. - Tricuspid valve: There was severe regurgitation. - Pulmonary arteries: Systolic pressure was severely increased. PA   peak pressure: 88 mm Hg (S).  Impressions:  - When compared to prior, PA pressures have increased.  Laboratory Data:  Chemistry Recent Labs  Lab 03/06/18 1128 03/07/18 0433 03/08/18 0430  NA 131* 134* 131*  K 3.9 3.9 3.9  CL 100* 101 95*  CO2 24 26 28   GLUCOSE 132* 89 125*  BUN 15 15 13   CREATININE 0.91 0.87 0.88  CALCIUM 8.4* 8.5* 8.6*  GFRNONAA 58* >60 >60  GFRAA >60 >60 >60  ANIONGAP 7 7 8     Recent Labs  Lab 03/02/18 0422  PROT 5.8*  ALBUMIN 2.0*  AST 40  ALT 15  ALKPHOS 69  BILITOT 1.0   Hematology Recent Labs  Lab 03/04/18 0302 03/05/18 0702 03/06/18 1128  WBC 5.9 4.1 2.6*  RBC 4.55 4.20 4.21  HGB 10.5* 9.5* 9.7*  HCT 34.7* 31.8* 31.9*  MCV 76.3* 75.7* 75.8*  MCH 23.1* 22.6* 23.0*  MCHC 30.3 29.9* 30.4  RDW 19.5* 19.6* 19.5*  PLT 242 180 199   Cardiac Enzymes Recent Labs  Lab 03/06/18 1803 03/06/18 2328  TROPONINI <0.03 <0.03    Recent Labs  Lab 03/06/18 1137  TROPIPOC 0.01    BNP Recent Labs  Lab 03/06/18 1352  BNP 790.8*    Radiology/Studies:  Dg Chest 2 View  Result Date: 03/06/2018 CLINICAL DATA:  Chest pain since yesterday. Swelling and weight gain. History of congestive heart failure. EXAM: CHEST - 2 VIEW COMPARISON:  03/04/2018 and multiple previous FINDINGS: Bilateral effusions continue to enlarge, associated with more basilar atelectasis. There is chronic cardiomegaly. The patient has had previous median sternotomy and CABG. There is aortic atherosclerosis. There is venous hypertension with interstitial edema. Calcified granulomas as seen  previously. No acute bone finding. IMPRESSION: Congestive heart failure. Enlarging effusions with basilar atelectasis. Venous hypertension and interstitial edema. Electronically Signed   By: Nelson Chimes M.D.   On: 03/06/2018 11:53    Assessment and Plan:   1. Acute on chronic diastolic heart failure -Presented with worsening edema and orthopnea.  Seems patient was under diuresed during last admission. -Currently on IV Lasix 60 mg twice daily.  She is diuresed 4.6L so far with 13lb weight loss (149-->136lb).  Doubt accurate.  Still significant volume overload.  Continue IV diuresis but will increase IV lasix to 80mg  BID starting tonight. Strict I & O. Watch renal function closely.   2.  Severe pulmonary hypertension -Prior echo 02/2017 showed PA pressure of 43 mmHg, now increased to 88 mmHg by echocardiogram 02/24/2018. Follow closely.   3. CAD with hx of CABG  -No chest pain.  Continue aspirin 3 times per week and statin.  4.  Carotid artery disease - 40-59% RICA and 34-74% stenosis LICA 11/5954.   No neurologic symptoms. Continue statin.   5. S/p AVR in 2005  -Normal functioning valve by echocardiogram 02/24/18.  Anticoagulated with Coumadin.  INR 3.88 today.  6.  Permanent atrial fibrillation -Rate controlled.  Continue Coumadin.  7.  Hypertension -Blood pressure relatively stable on beta-blocker and ARB.   For questions or updates, please contact [place] Please consult [email or web address] for contact info under Cardiology/STEMI.   Jarrett Soho, [place]  03/08/2018 2:51  PM   Personally seen and examined. Agree with above.  Frustrated that she is back in the hospital, gaining weight.  She does admit that she has been eating saltine crackers and soup most of the time.  Ejection fraction normal on echocardiogram, diastolic dysfunction present.  Pulmonary pressures were elevated during previous check.  GEN: Well nourished, well developed, in no acute distress  HEENT: normal   Neck: no JVD, carotid bruits, or masses Cardiac: IRRR; S2 click, Saint Jude mechanical valve no murmurs, rubs, or gallops,2+ lower extremity edema  Respiratory:  clear to auscultation bilaterally, normal work of breathing GI: soft, nontender, nondistended, + BS MS: no deformity or atrophy  Skin: warm and dry, no rash Neuro:  Alert and Oriented x 3, Strength and sensation are intact Psych: euthymic mood, full affect  Telemetry, EKG personally reviewed shows atrial fibrillation heart rate in the 60s.  Assessment and plan  Acute on chronic diastolic heart failure -Grade 3.  Continue with IV diuresis.  We will increase to Lasix 80 mg twice a day.  Quite frustrated.  We will give her strategies to hopefully maintain her dry weight at home.  Advised her not to eat saltines or soup.  Continue with education.  CAD status post CABG -Stable, no anginal symptoms.  Secondary prevention  AVR mechanical 2005 Saint Jude -Functioning normally, click heard.  INR 3.8 supratherapeutic.  Atrial fibrillation permanent - Coumadin.  Rate control.  Doing well.  Candee Furbish, MD

## 2018-03-09 LAB — GLUCOSE, CAPILLARY
GLUCOSE-CAPILLARY: 93 mg/dL (ref 65–99)
GLUCOSE-CAPILLARY: 110 mg/dL — AB (ref 65–99)
GLUCOSE-CAPILLARY: 118 mg/dL — AB (ref 65–99)
Glucose-Capillary: 159 mg/dL — ABNORMAL HIGH (ref 65–99)
Glucose-Capillary: 170 mg/dL — ABNORMAL HIGH (ref 65–99)

## 2018-03-09 LAB — BASIC METABOLIC PANEL
Anion gap: 6 (ref 5–15)
BUN: 17 mg/dL (ref 6–20)
CO2: 31 mmol/L (ref 22–32)
Calcium: 8.3 mg/dL — ABNORMAL LOW (ref 8.9–10.3)
Chloride: 98 mmol/L — ABNORMAL LOW (ref 101–111)
Creatinine, Ser: 0.88 mg/dL (ref 0.44–1.00)
GFR calc Af Amer: >60 mL/min (ref >60)
GLUCOSE: 101 mg/dL — AB (ref 65–99)
POTASSIUM: 3.7 mmol/L (ref 3.5–5.1)
Sodium: 135 mmol/L (ref 135–145)

## 2018-03-09 LAB — PROTIME-INR
INR: 3.1
Prothrombin Time: 31.7 seconds — ABNORMAL HIGH (ref 11.4–15.2)

## 2018-03-09 MED ORDER — WARFARIN SODIUM 5 MG PO TABS
5.0000 mg | ORAL_TABLET | Freq: Once | ORAL | Status: AC
  Administered 2018-03-09: 5 mg via ORAL
  Filled 2018-03-09: qty 1

## 2018-03-09 MED ORDER — WARFARIN SODIUM 2 MG PO TABS: 4.0000 mg | ORAL_TABLET | Freq: Once | ORAL | Status: DC

## 2018-03-09 NOTE — Progress Notes (Signed)
PROGRESS NOTE    [name]  SEG:[id number] DOB: Oct 19, 1940 DOA: 03/06/2018 PCP: [name], MD   Brief Narrative:77 y.o.femalewith medical history significant forhypertension, chronic grade 3 diastolic heart failure, deficiency anemia, chronic atrial fibrillation chronic status post AVR on warfarin, diabetes on oral agents. Patient was discharged on 5/28 after protracted hospitalization in the context of calculus cholecystitis status post cholecystectomy. Surgery was delayed in the context of supratherapeutic INR and need for vitamin K reversal. Anticoagulation was resumed on 5/24 on the afternoon of surgery. Patient was noted with lower extremity edema and chest x-ray positive for heart failure 5/27 and was given an IV Lasix dose of 20 mg x 1. Patient was not on oxygen priorto admission but was discharged home on 2 L oxygen. Patient reports over the past 24 hours she has noted increased shortness of breath, productive cough of clear to pinkish tinged sputum. And worsening lower extremity edema. Of note weight at time of admission on 5/17 was 139 lbs. Today's weight is 149 lbs. Chest x-ray in the ER consistent with CHF and BNP elevated at 791. Patient has been given a dose of Lasix 60 mg IV x1 in the ER noting home dose of 60 mg daily. ED Course: Vital Signs:BP (!) 147/104  Pulse 74  Temp 98.3 F (36.8 C) (Skin)  Resp 18  Ht 5\' 1"  (1.549 m)  Wt 67.6 kg (149 lb)  SpO2 93%  BMI 28.15 kg/m  CXR:CHF with venous hypertension and interstitial edema,large and effusions Lab data:Na 131, potassium 3.9, chloride 100, CO2 24, glucose 132, BUN 15, creatinine 0.91, anion gap 7, BNP 791, point-of-care troponin normal, white count 2600 differential not obtained, hemoglobin 9.7, platelets 199,000-INR 4.58 Medications and treatments:Lasix 60 mg IV x1    Assessment & Plan:   Principal Problem:   Acute on chronic diastolic heart failure ([facility]) Active Problems:  Diabetes mellitus type 2, controlled (HCC)   Chronic atrial fibrillation ([facility])   S/P cholecystectomy   HTN (hypertension)   H/O mechanical aortic valve replacement   Acute hyponatremia   Acute respiratory failure with hypoxia ([facility])   Moderate to severe pulmonary hypertension ([facility])  1]acute on chronic hypoxic respiratory failure secondary to CHF diastolic exacerbation patient admitted with increasing shortness of breath increasing edema chest pressure and orthopnea.chest x-ray findings consistent with heart failure and pulmonary edema elevated BNP. Continue IV Lasix and potassium supplementation.  Continue ACE inhibitor and beta-blocker.  Monitor renal functions.  Her weight has come down from 146 to 135 pounds.Will not repeat echo recent echo was ejection fraction 55 to 60% with pulmonary hypertension 88 mmHg. She is negative by 2800 cc since admit. Plan per cardiology. 2]status post recent cholecystectomy 5/24 /19-continues to have postprandial diarrhea continue Florastor addcholestyramine.  Better.  3]chronic atrial fibrillation /mechanical aortic valve replacement. Continue beta-blocker. Patient is on Coumadin with supratherapeutic INR pharmacy following.  INR down to 3.80 pharmacy following.  Patient very concerned about erratic INR levels.  4]hypertension continue Avapro and metoprolol.  5]type 2 diabetes continue glipizide metformin being on hold continue SSI.  6]hyperlipidemia continue Lipitor.  7] moderate malnutrition secondary to chronic illness.  Followed by nutritionist.  Evidenced by fat depletion and edema.  Continue with multivitamin and minerals daily.  Glucerna and Prostat     DVT prophylaxis: Coumadin. Code Status: Partial code Family Communication: Discussed with daughter who was in the room this morning Disposition Plan: Hopefully she can be discharged early next week.  She is very eager and  anxious to go home though she knows she is not ready to be  discharged. Consultants: Cardiology  Procedures: None Antimicrobials: None  Subjective: Feels she is better but still has a lot of swelling in both legs.  Objective: Vitals:   03/09/18 0525 03/09/18 0527 03/09/18 0848 03/09/18 1243  BP:  (!) 173/62 (!) 150/63 (!) 135/54  Pulse:  (!) 59 87 64  Resp:  16  18  Temp:  98 F (36.7 C)  97.9 F (36.6 C)  TempSrc:  Oral  Oral  SpO2:  99%  98%  Weight: 61.3 kg (135 lb 3.2 oz)     Height:        Intake/Output Summary (Last 24 hours) at 03/09/2018 1510 Last data filed at 03/09/2018 1000 Gross per 24 hour  Intake 540 ml  Output 1800 ml  Net -1260 ml   Filed Weights   03/08/18 0600 03/09/18 0100 03/09/18 0525  Weight: 61.7 kg (136 lb) 61.3 kg (135 lb 3.2 oz) 61.3 kg (135 lb 3.2 oz)    Examination:  General exam: Appears calm and comfortable  Respiratory system: Crackles at b/l bases  auscultation. Respiratory effort normal. Cardiovascular system: S1 & S2 heard, RRR. No JVD, murmurs, rubs, gallops or clicks. No pedal edema. Gastrointestinal system: Abdomen is nondistended, soft and nontender. No organomegaly or masses felt. Normal bowel sounds heard. Central nervous system: Alert and oriented. No focal neurological deficits. Extremities:2 plus edema Skin: No rashes, lesions or ulcers Psychiatry: Judgement and insight appear normal. Mood & affect appropriate.     Data Reviewed: I have personally reviewed following labs and imaging studies  CBC: Recent Labs  Lab 03/03/18 0341 03/04/18 0302 03/05/18 0702 03/06/18 1128  WBC 5.7 5.9 4.1 2.6*  HGB 9.8* 10.5* 9.5* 9.7*  HCT 32.9* 34.7* 31.8* 31.9*  MCV 75.6* 76.3* 75.7* 75.8*  PLT 212 242 180 202   Basic Metabolic Panel: Recent Labs  Lab 03/05/18 0702 03/06/18 1128 03/07/18 0433 03/08/18 0430 03/09/18 0644  NA 133* 131* 134* 131* 135  K 4.1 3.9 3.9 3.9 3.7  CL 101 100* 101 95* 98*  CO2 25 24 26 28 31   GLUCOSE 123* 132* 89 125* 101*  BUN 18 15 15 13 17   CREATININE  1.03* 0.91 0.87 0.88 0.88  CALCIUM 8.3* 8.4* 8.5* 8.6* 8.3*   GFR: Estimated Creatinine Clearance: 43.5 mL/min (by C-G formula based on SCr of 0.88 mg/dL). Liver Function Tests: No results for input(s): AST, ALT, ALKPHOS, BILITOT, PROT, ALBUMIN in the last 168 hours. No results for input(s): LIPASE, AMYLASE in the last 168 hours. No results for input(s): AMMONIA in the last 168 hours. Coagulation Profile: Recent Labs  Lab 03/05/18 0702 03/06/18 1602 03/07/18 0433 03/08/18 0430 03/09/18 0644  INR 3.54 4.58* 5.80* 3.88 3.10   Cardiac Enzymes: Recent Labs  Lab 03/06/18 1803 03/06/18 2328  TROPONINI <0.03 <0.03   BNP (last 3 results) No results for input(s): PROBNP in the last 8760 hours. HbA1C: No results for input(s): HGBA1C in the last 72 hours. CBG: Recent Labs  Lab 03/08/18 1635 03/08/18 2106 03/09/18 0818 03/09/18 1204 03/09/18 1239  GLUCAP 116* 116* 93 159* 170*   Lipid Profile: No results for input(s): CHOL, HDL, LDLCALC, TRIG, CHOLHDL, LDLDIRECT in the last 72 hours. Thyroid Function Tests: No results for input(s): TSH, T4TOTAL, FREET4, T3FREE, THYROIDAB in the last 72 hours. Anemia Panel: No results for input(s): VITAMINB12, FOLATE, FERRITIN, TIBC, IRON, RETICCTPCT in the last 72 hours. Sepsis Labs: No results for  input(s): PROCALCITON, LATICACIDVEN in the last 168 hours.  Recent Results (from the past 240 hour(s))  Surgical pcr screen     Status: None   Collection Time: 03/01/18  8:06 AM  Result Value Ref Range Status   MRSA, PCR NEGATIVE NEGATIVE Final   Staphylococcus aureus NEGATIVE NEGATIVE Final    Comment: (NOTE) The Xpert SA Assay (FDA approved for NASAL specimens in patients 25 years of age and older), is one component of a comprehensive surveillance program. It is not intended to diagnose infection nor to guide or monitor treatment. Performed at [facility], [place] [address], [place], [place] [address]          Radiology  Studies: No results found.      Scheduled Meds: . ALPRAZolam  0.25-0.5 mg Oral See admin instructions  . aspirin EC  81 mg Oral Q M,W,F  . atorvastatin  80 mg Oral Q supper  . cholecalciferol  1,000 Units Oral Daily  . cholestyramine  4 g Oral TID  . feeding supplement (GLUCERNA SHAKE)  237 mL Oral BID BM  . feeding supplement (PRO-STAT SUGAR FREE 64)  30 mL Oral BID  . furosemide  80 mg Intravenous Q12H  . insulin aspart  0-5 Units Subcutaneous QHS  . insulin aspart  0-9 Units Subcutaneous TID WC  . irbesartan  300 mg Oral Daily  . magnesium oxide  400 mg Oral Daily  . metoprolol tartrate  50 mg Oral BID  . multivitamin with minerals  1 tablet Oral Daily  . pantoprazole  40 mg Oral Daily  . potassium chloride  20 mEq Oral BID  . saccharomyces boulardii  250 mg Oral BID  . sodium chloride flush  3 mL Intravenous Q12H  . warfarin  5 mg Oral ONCE-1800  . Warfarin - Pharmacist Dosing Inpatient   Does not apply q1800   Continuous Infusions: . sodium chloride       LOS: 3 days     [name], MD [facility]  If 7PM-7AM, please contact night-coverage [email or web address] Password [email or web address] 03/09/2018, 3:10 PM

## 2018-03-09 NOTE — Plan of Care (Signed)
  Problem: Health Behavior/Discharge Planning: Goal: Ability to manage health-related needs will improve Outcome: Progressing   Problem: Clinical Measurements: Goal: Ability to maintain clinical measurements within normal limits will improve Outcome: Progressing   Problem: Clinical Measurements: Goal: Diagnostic test results will improve Outcome: Progressing   

## 2018-03-09 NOTE — Progress Notes (Signed)
Progress Note  Patient Name: [name] Date of Encounter: 03/09/2018  Primary Cardiologist: Quay Burow, MD   Subjective   Lower extremity edema still present, shortness of breath improving.  Inpatient Medications    Scheduled Meds: . ALPRAZolam  0.25-0.5 mg Oral See admin instructions  . aspirin EC  81 mg Oral Q M,W,F  . atorvastatin  80 mg Oral Q supper  . cholecalciferol  1,000 Units Oral Daily  . cholestyramine  4 g Oral TID  . feeding supplement (GLUCERNA SHAKE)  237 mL Oral BID BM  . feeding supplement (PRO-STAT SUGAR FREE 64)  30 mL Oral BID  . furosemide  80 mg Intravenous Q12H  . insulin aspart  0-5 Units Subcutaneous QHS  . insulin aspart  0-9 Units Subcutaneous TID WC  . irbesartan  300 mg Oral Daily  . magnesium oxide  400 mg Oral Daily  . metoprolol tartrate  50 mg Oral BID  . multivitamin with minerals  1 tablet Oral Daily  . pantoprazole  40 mg Oral Daily  . potassium chloride  20 mEq Oral BID  . saccharomyces boulardii  250 mg Oral BID  . sodium chloride flush  3 mL Intravenous Q12H  . warfarin  5 mg Oral ONCE-1800  . Warfarin - Pharmacist Dosing Inpatient   Does not apply q1800   Continuous Infusions: . sodium chloride     PRN Meds: sodium chloride, acetaminophen, guaiFENesin, ondansetron (ZOFRAN) IV, polyvinyl alcohol, sodium chloride flush, traMADol   Vital Signs    Vitals:   03/09/18 0100 03/09/18 0525 03/09/18 0527 03/09/18 0848  BP: (!) 163/58  (!) 173/62 (!) 150/63  Pulse: 61  (!) 59 87  Resp: 18  16   Temp: 97.8 F (36.6 C)  98 F (36.7 C)   TempSrc: Oral  Oral   SpO2: 98%  99%   Weight: 135 lb 3.2 oz (61.3 kg) 135 lb 3.2 oz (61.3 kg)    Height:        Intake/Output Summary (Last 24 hours) at 03/09/2018 1216 Last data filed at 03/09/2018 0900 Gross per 24 hour  Intake 660 ml  Output 1500 ml  Net -840 ml   Filed Weights   03/08/18 0600 03/09/18 0100 03/09/18 0525  Weight: 136 lb (61.7 kg) 135 lb 3.2 oz (61.3 kg) 135 lb 3.2  oz (61.3 kg)    Telemetry    Atrial fibrillation heart rate in the 60s- Personally Reviewed  ECG    A. fib 60s- Personally Reviewed  Physical Exam   GEN: No acute distress.   Neck: No JVD Cardiac: IRRR, sharp S2 click, no murmurs, rubs, or gallops.  Respiratory: Clear to auscultation bilaterally. GI: Soft, nontender, non-distended  MS:  2+ bilateral lower extremity edema; No deformity. Neuro:  Nonfocal  Psych: Normal affect   Labs    Chemistry Recent Labs  Lab 03/07/18 0433 03/08/18 0430 03/09/18 0644  NA 134* 131* 135  K 3.9 3.9 3.7  CL 101 95* 98*  CO2 26 28 31   GLUCOSE 89 125* 101*  BUN 15 13 17   CREATININE 0.87 0.88 0.88  CALCIUM 8.5* 8.6* 8.3*  GFRNONAA >60 >60 >60  GFRAA >60 >60 >60  ANIONGAP 7 8 6      Hematology Recent Labs  Lab 03/04/18 0302 03/05/18 0702 03/06/18 1128  WBC 5.9 4.1 2.6*  RBC 4.55 4.20 4.21  HGB 10.5* 9.5* 9.7*  HCT 34.7* 31.8* 31.9*  MCV 76.3* 75.7* 75.8*  MCH 23.1* 22.6* 23.0*  MCHC 30.3 29.9* 30.4  RDW 19.5* 19.6* 19.5*  PLT 242 180 199    Cardiac Enzymes Recent Labs  Lab 03/06/18 1803 03/06/18 2328  TROPONINI <0.03 <0.03    Recent Labs  Lab 03/06/18 1137  TROPIPOC 0.01     BNP Recent Labs  Lab 03/06/18 1352  BNP 790.8*     DDimer No results for input(s): DDIMER in the last 168 hours.   Radiology    No results found.  Cardiac Studies   Normal EF, grade 3 diastolic dysfunction  Patient Profile     77 y.o. female here with acute on chronic diastolic heart failure, lower extremity edema  Assessment & Plan    Acute on chronic diastolic heart failure -Worsening edema orthopnea frustrated.  Weight is now back down to 135.  Good urine output yesterday.  3.5 net.  Lasix 80 mg IV every 12.  Overall improved.  She is feeling better.  Let us continue with diuresis again today.  Mechanical AVR, click present, INR currently 3.1, supratherapeutic.  For questions or updates, please contact [place] Please consult [email or web address] for contact info under Cardiology/STEMI.      Signed, Candee Furbish, MD  03/09/2018, 12:16 PM

## 2018-03-09 NOTE — Progress Notes (Addendum)
ANTICOAGULATION CONSULT NOTE - Follow Up Consult  Pharmacy Consult for Coumadin Indication: atrial fibrillation and mechanical AVR  Labs: Recent Labs    03/06/18 1128 03/06/18 1602 03/06/18 1803 03/06/18 2328 03/07/18 0433 03/08/18 0430  HGB 9.7*  --   --   --   --   --   HCT 31.9*  --   --   --   --   --   PLT 199  --   --   --   --   --   LABPROT  --  43.0*  --   --  51.7* 37.8*  INR  --  4.58*  --   --  5.80* 3.88  CREATININE 0.91  --   --   --  0.87 0.88  TROPONINI  --   --  <0.03 <0.03  --   --    Assessment: 77 yo F on Coumadin PTA for mechanical AVR and afib.  Coumadin was held last week for cholecystectomy.  Pt was discharged 5/28 s/p cholecystectomy. Returned to ED with SOB.  Noted to have elevated INR on presentation.  Suspect elevated INR is a combination of higher doses given during last admission and decreased PO intake since procedure.  INR had a sharp trend down from 5.8 to 3.8 the day prior, likely from the held doses prior to 5/31. INR continues to trend down and is now therapeutic at 3.1. I anticipate that the INR will continue to trend down and with the higher INR goal, will increase the dose to prevent a subtherapeutic level.  PTA Dosing: 7.5mg  MWF, 5mg  TTSS, admit INR was 4.5  Goal of Therapy:  INR 2.5-3.5 Monitor platelets by anticoagulation protocol: Yes   Plan:  Coumadin 5mg  PO x 1 tonight Continue daily INR   Patterson Hammersmith PharmD PGY1 Pharmacy Practice Resident 03/09/2018 8:11 AM Phone: [phone]

## 2018-03-10 LAB — BASIC METABOLIC PANEL
Anion gap: 9 (ref 5–15)
BUN: 19 mg/dL (ref 6–20)
CHLORIDE: 93 mmol/L — AB (ref 101–111)
CO2: 31 mmol/L (ref 22–32)
Calcium: 8.7 mg/dL — ABNORMAL LOW (ref 8.9–10.3)
Creatinine, Ser: 1.01 mg/dL — ABNORMAL HIGH (ref 0.44–1.00)
GFR, EST AFRICAN AMERICAN: 60 mL/min — AB (ref >60)
GFR, EST NON AFRICAN AMERICAN: 52 mL/min — AB (ref >60)
Glucose, Bld: 111 mg/dL — ABNORMAL HIGH (ref 65–99)
POTASSIUM: 4.2 mmol/L (ref 3.5–5.1)
SODIUM: 133 mmol/L — AB (ref 135–145)

## 2018-03-10 LAB — GLUCOSE, CAPILLARY
GLUCOSE-CAPILLARY: 130 mg/dL — AB (ref 65–99)
Glucose-Capillary: 139 mg/dL — ABNORMAL HIGH (ref 65–99)
Glucose-Capillary: 135 mg/dL — ABNORMAL HIGH (ref 65–99)
Glucose-Capillary: 120 mg/dL — ABNORMAL HIGH (ref 65–99)

## 2018-03-10 LAB — PROTIME-INR
INR: 2.21
PROTHROMBIN TIME: 24.4 s — AB (ref 11.4–15.2)

## 2018-03-10 MED ORDER — MICONAZOLE NITRATE 2 % EX CREA
TOPICAL_CREAM | Freq: Two times a day (BID) | CUTANEOUS | Status: DC
  Administered 2018-03-10 – 2018-03-13 (×7): via TOPICAL
  Filled 2018-03-10: qty 28.4

## 2018-03-10 MED ORDER — WARFARIN SODIUM 5 MG PO TABS
5.0000 mg | ORAL_TABLET | Freq: Once | ORAL | Status: AC
  Administered 2018-03-10: 5 mg via ORAL
  Filled 2018-03-10: qty 1

## 2018-03-10 NOTE — Progress Notes (Signed)
ANTICOAGULATION CONSULT NOTE - Follow Up Consult  Pharmacy Consult for Coumadin Indication: atrial fibrillation and mechanical AVR  Labs: Recent Labs    03/08/18 0430 03/09/18 0644 03/10/18 0702  LABPROT 37.8* 31.7* 24.4*  INR 3.88 3.10 2.21  CREATININE 0.88 0.88 1.01*   Assessment: 77 yo F on Coumadin PTA for mechanical AVR and afib.  Coumadin was held last week for cholecystectomy.  Pt was discharged 5/28 s/p cholecystectomy. Returned to ED with SOB.  Noted to have elevated INR on presentation.  Suspect elevated INR is a combination of higher doses given during last admission and decreased PO intake since procedure.  INR has continued to trend down as anticipated with previous held doses on 5/29-30. Will continue current dosing as we have yet to see the effects from yesterday's coumadin and rapid dose escalations is likely just to make her supratherapeutic again. Will message MD to assess whether a heparin bridge is needed with this subtherapeutic INR (2.21 with goal 2.5-3.5).  PTA Dosing: 7.5mg  MWF, 5mg  TTSS, admit INR was 4.5  Goal of Therapy:  INR 2.5-3.5 Monitor platelets by anticoagulation protocol: Yes   Plan:  Coumadin 5mg  PO x 1 tonight Continue daily INR   Patterson Hammersmith PharmD PGY1 Pharmacy Practice Resident 03/10/2018 8:27 AM Phone: [phone]

## 2018-03-10 NOTE — Plan of Care (Signed)
  Problem: Clinical Measurements: Goal: Diagnostic test results will improve Outcome: Progressing   Problem: Clinical Measurements: Goal: Cardiovascular complication will be avoided Outcome: Progressing   Problem: Activity: Goal: Risk for activity intolerance will decrease Outcome: Progressing   

## 2018-03-10 NOTE — Progress Notes (Signed)
Patient was able to ambulate 148ft with no distress.

## 2018-03-10 NOTE — Progress Notes (Signed)
PROGRESS NOTE    [name]  OHY:[id number] DOB: [birth year] DOA: 03/06/2018 PCP: [name], MD  Brief Narrative:77 y.o.femalewith medical history significant forhypertension, chronic grade 3 diastolic heart failure, deficiency anemia, chronic atrial fibrillation chronic status post AVR on warfarin, diabetes on oral agents. Patient was discharged on 5/28 after protracted hospitalization in the context of calculus cholecystitis status post cholecystectomy. Surgery was delayed in the context of supratherapeutic INR and need for vitamin K reversal. Anticoagulation was resumed on 5/24 on the afternoon of surgery. Patient was noted with lower extremity edema and chest x-ray positive for heart failure 5/27 and was given an IV Lasix dose of 20 mg x 1. Patient was not on oxygen priorto admission but was discharged home on 2 L oxygen. Patient reports over the past 24 hours she has noted increased shortness of breath, productive cough of clear to pinkish tinged sputum. And worsening lower extremity edema. Of note weight at time of admission on 5/17 was 139 lbs. Today's weight is 149 lbs. Chest x-ray in the ER consistent with CHF and BNP elevated at 791. Patient has been given a dose of Lasix 60 mg IV x1 in the ER noting home dose of 60 mg daily. ED Course: Vital Signs:BP (!) 147/104  Pulse 74  Temp 98.3 F (36.8 C) (Skin)  Resp 18  Ht 5\' 1"  (1.549 m)  Wt 67.6 kg (149 lb)  SpO2 93%  BMI 28.15 kg/m  CXR:CHF with venous hypertension and interstitial edema,large and effusions Lab data:Na 131, potassium 3.9, chloride 100, CO2 24, glucose 132, BUN 15, creatinine 0.91, anion gap 7, BNP 791, point-of-care troponin normal, white count 2600 differential not obtained, hemoglobin 9.7, platelets 199,000-INR 4.58 Medications and treatments:Lasix 60 mg IV x1      Assessment & Plan:   Principal Problem:   Acute on chronic diastolic heart failure (HCC) Active Problems:   Diabetes mellitus type 2, controlled (HCC)   Chronic atrial fibrillation (HCC)   S/P cholecystectomy   HTN (hypertension)   H/O mechanical aortic valve replacement   Acute hyponatremia   Acute respiratory failure with hypoxia (HCC)   Moderate to severe pulmonary hypertension (HCC)  1]acute on chronic hypoxic respiratory failure secondary to CHF diastolic exacerbation patient admitted with increasing shortness of breath increasing edema chest pressure and orthopnea.chest x-ray findings consistent with heart failure and pulmonary edema elevated BNP. Continue IV Lasix and potassium supplementation.Continue ACE inhibitor and beta-blocker. Monitor renal functions. Her weight has come down from 146 to 135 pounds.Will not repeat echo recent echo was ejection fraction 55 to 60% with pulmonary hypertension 88 mmHg. She is negative by2800cc since admit. Plan per cardiology.  Patient with significant edema 2-3+ bilateral lower extremity which is also probably related to low albumin and malnutrition.  2]status post recent cholecystectomy 5/24 /19-continues to have postprandial diarrhea continue Florastor addcholestyramine.Better.  3]chronic atrial fibrillation /mechanical aortic valve replacement. Continue beta-blocker. Patient is on Coumadin with supratherapeutic INR pharmacy following.INR down to 3.80 pharmacy following. Patient very concerned about erratic INR levels.  4]hypertension continue Avapro and metoprolol.  5]type 2 diabetes continue glipizide metformin being on hold continue SSI.  6]hyperlipidemia continue Lipitor.  7]moderate malnutrition secondary to chronic illness. Followed by nutritionist.Evidenced by fat depletion and edema. Continue with multivitamin and minerals daily. Glucerna and Prostat     DVT prophylaxis: Coumadin Code Status: Partial code Family Communication: No family available today Disposition Plan: Hopefully we can discharge her  in 1 to 2 days cardiology Consultants: Cardiology  Procedures: None Antimicrobials: None  Subjective: Feels better still has lower extremity edema  Objective: I saw her early this morning as soon as she got out of bed she is still had significant 2-3+ pitting edema in both lower extremities.  The Vitals:   03/09/18 2022 03/09/18 2342 03/09/18 2346 03/10/18 0514  BP: (!) 155/54 (!) 172/53 (!) 163/58 (!) 153/58  Pulse: 84 70 (!) 54 63  Resp:  18  19  Temp: 97.8 F (36.6 C) 98.2 F (36.8 C)  98.4 F (36.9 C)  TempSrc: Oral Oral  Oral  SpO2: 99% 98% 100% 98%  Weight:      Height:        Intake/Output Summary (Last 24 hours) at 03/10/2018 1038 Last data filed at 03/10/2018 1000 Gross per 24 hour  Intake 720 ml  Output 1501 ml  Net -781 ml   Filed Weights   03/08/18 0600 03/09/18 0100 03/09/18 0525  Weight: 61.7 kg (136 lb) 61.3 kg (135 lb 3.2 oz) 61.3 kg (135 lb 3.2 oz)    Examination:  General exam: Appears calm and comfortable  Respiratory system: Decreased breath sounds at the bases to auscultation. Respiratory effort normal. Cardiovascular system: S1 & S2 heard, RRR. No JVD, murmurs, rubs, gallops or clicks. No pedal edema. Gastrointestinal system: Abdomen is nondistended, soft and nontender. No organomegaly or masses felt. Normal bowel sounds heard. Central nervous system: Alert and oriented. No focal neurological deficits. Extremities: 2+ edema Skin: No rashes, lesions or ulcers Psychiatry: Judgement and insight appear normal. Mood & affect appropriate.     Data Reviewed: I have personally reviewed following labs and imaging studies  CBC: Recent Labs  Lab 03/04/18 0302 03/05/18 0702 03/06/18 1128  WBC 5.9 4.1 2.6*  HGB 10.5* 9.5* 9.7*  HCT 34.7* 31.8* 31.9*  MCV 76.3* 75.7* 75.8*  PLT 242 180 294   Basic Metabolic Panel: Recent Labs  Lab 03/06/18 1128 03/07/18 0433 03/08/18 0430 03/09/18 0644 03/10/18 0702  NA 131* 134* 131* 135 133*  K 3.9 3.9 3.9  3.7 4.2  CL 100* 101 95* 98* 93*  CO2 24 26 28 31 31   GLUCOSE 132* 89 125* 101* 111*  BUN 15 15 13 17 19   CREATININE 0.91 0.87 0.88 0.88 1.01*  CALCIUM 8.4* 8.5* 8.6* 8.3* 8.7*   GFR: Estimated Creatinine Clearance: 37.9 mL/min (A) (by C-G formula based on SCr of 1.01 mg/dL (H)). Liver Function Tests: No results for input(s): AST, ALT, ALKPHOS, BILITOT, PROT, ALBUMIN in the last 168 hours. No results for input(s): LIPASE, AMYLASE in the last 168 hours. No results for input(s): AMMONIA in the last 168 hours. Coagulation Profile: Recent Labs  Lab 03/06/18 1602 03/07/18 0433 03/08/18 0430 03/09/18 0644 03/10/18 0702  INR 4.58* 5.80* 3.88 3.10 2.21   Cardiac Enzymes: Recent Labs  Lab 03/06/18 1803 03/06/18 2328  TROPONINI <0.03 <0.03   BNP (last 3 results) No results for input(s): PROBNP in the last 8760 hours. HbA1C: No results for input(s): HGBA1C in the last 72 hours. CBG: Recent Labs  Lab 03/09/18 1204 03/09/18 1239 03/09/18 1614 03/09/18 2141 03/10/18 0712  GLUCAP 159* 170* 110* 118* 130*   Lipid Profile: No results for input(s): CHOL, HDL, LDLCALC, TRIG, CHOLHDL, LDLDIRECT in the last 72 hours. Thyroid Function Tests: No results for input(s): TSH, T4TOTAL, FREET4, T3FREE, THYROIDAB in the last 72 hours. Anemia Panel: No results for input(s): VITAMINB12, FOLATE, FERRITIN, TIBC, IRON, RETICCTPCT in the last 72 hours. Sepsis Labs: No results for input(s):  PROCALCITON, LATICACIDVEN in the last 168 hours.  Recent Results (from the past 240 hour(s))  Surgical pcr screen     Status: None   Collection Time: 03/01/18  8:06 AM  Result Value Ref Range Status   MRSA, PCR NEGATIVE NEGATIVE Final   Staphylococcus aureus NEGATIVE NEGATIVE Final    Comment: (NOTE) The Xpert SA Assay (FDA approved for NASAL specimens in patients 23 years of age and older), is one component of a comprehensive surveillance program. It is not intended to diagnose infection nor to guide  or monitor treatment. Performed at [facility], [facility] [place] [address] [place], [place] [address]          Radiology Studies: No results found.      Scheduled Meds: . ALPRAZolam  0.25-0.5 mg Oral See admin instructions  . aspirin EC  81 mg Oral Q M,W,F  . atorvastatin  80 mg Oral Q supper  . cholecalciferol  1,000 Units Oral Daily  . cholestyramine  4 g Oral TID  . feeding supplement (GLUCERNA SHAKE)  237 mL Oral BID BM  . feeding supplement (PRO-STAT SUGAR FREE 64)  30 mL Oral BID  . furosemide  80 mg Intravenous Q12H  . insulin aspart  0-5 Units Subcutaneous QHS  . insulin aspart  0-9 Units Subcutaneous TID WC  . irbesartan  300 mg Oral Daily  . magnesium oxide  400 mg Oral Daily  . metoprolol tartrate  50 mg Oral BID  . multivitamin with minerals  1 tablet Oral Daily  . pantoprazole  40 mg Oral Daily  . potassium chloride  20 mEq Oral BID  . saccharomyces boulardii  250 mg Oral BID  . sodium chloride flush  3 mL Intravenous Q12H  . warfarin  5 mg Oral ONCE-1800  . Warfarin - Pharmacist Dosing Inpatient   Does not apply q1800   Continuous Infusions: . sodium chloride       LOS: 4 days     [name], MD [facility] If 7PM-7AM, please contact night-coverage [email or web address] Password [facility] 03/10/2018, 10:38 AM

## 2018-03-10 NOTE — Progress Notes (Signed)
Progress Note  Patient Name: [name] Date of Encounter: 03/10/2018  Primary Cardiologist: Quay Burow, MD   Subjective   Continuing to improve.  He will has lower extremity edema.  Standing up at sink brushing hair.  Inpatient Medications    Scheduled Meds: . ALPRAZolam  0.25-0.5 mg Oral See admin instructions  . aspirin EC  81 mg Oral Q M,W,F  . atorvastatin  80 mg Oral Q supper  . cholecalciferol  1,000 Units Oral Daily  . cholestyramine  4 g Oral TID  . feeding supplement (GLUCERNA SHAKE)  237 mL Oral BID BM  . feeding supplement (PRO-STAT SUGAR FREE 64)  30 mL Oral BID  . furosemide  80 mg Intravenous Q12H  . insulin aspart  0-5 Units Subcutaneous QHS  . insulin aspart  0-9 Units Subcutaneous TID WC  . irbesartan  300 mg Oral Daily  . magnesium oxide  400 mg Oral Daily  . metoprolol tartrate  50 mg Oral BID  . multivitamin with minerals  1 tablet Oral Daily  . pantoprazole  40 mg Oral Daily  . potassium chloride  20 mEq Oral BID  . saccharomyces boulardii  250 mg Oral BID  . sodium chloride flush  3 mL Intravenous Q12H  . warfarin  5 mg Oral ONCE-1800  . Warfarin - Pharmacist Dosing Inpatient   Does not apply q1800   Continuous Infusions: . sodium chloride     PRN Meds: sodium chloride, acetaminophen, guaiFENesin, ondansetron (ZOFRAN) IV, polyvinyl alcohol, sodium chloride flush, traMADol   Vital Signs    Vitals:   03/09/18 2022 03/09/18 2342 03/09/18 2346 03/10/18 0514  BP: (!) 155/54 (!) 172/53 (!) 163/58 (!) 153/58  Pulse: 84 70 (!) 54 63  Resp:  18  19  Temp: 97.8 F (36.6 C) 98.2 F (36.8 C)  98.4 F (36.9 C)  TempSrc: Oral Oral  Oral  SpO2: 99% 98% 100% 98%  Weight:      Height:        Intake/Output Summary (Last 24 hours) at 03/10/2018 1021 Last data filed at 03/10/2018 0900 Gross per 24 hour  Intake 720 ml  Output 1201 ml  Net -481 ml   Filed Weights   03/08/18 0600 03/09/18 0100 03/09/18 0525  Weight: 136 lb (61.7 kg) 135 lb 3.2  oz (61.3 kg) 135 lb 3.2 oz (61.3 kg)    Telemetry    Atrial fibrillation heart rate in the 60s- Personally Reviewed  ECG    A. fib 60s, no change- Personally Reviewed  Physical Exam   GEN: No acute distress.  Stable Neck: No JVD Cardiac: IRRR, sharp S2 click, no murmurs, rubs, or gallops.  Respiratory: Clear to auscultation bilaterally. GI: Soft, nontender, non-distended  MS:  2+ bilateral lower extremity edema, mostly pedal; No deformity. Neuro:  Nonfocal  Psych: Normal affect   Labs    Chemistry Recent Labs  Lab 03/08/18 0430 03/09/18 0644 03/10/18 0702  NA 131* 135 133*  K 3.9 3.7 4.2  CL 95* 98* 93*  CO2 28 31 31   GLUCOSE 125* 101* 111*  BUN 13 17 19   CREATININE 0.88 0.88 1.01*  CALCIUM 8.6* 8.3* 8.7*  GFRNONAA >60 >60 52*  GFRAA >60 >60 60*  ANIONGAP 8 6 9      Hematology Recent Labs  Lab 03/04/18 0302 03/05/18 0702 03/06/18 1128  WBC 5.9 4.1 2.6*  RBC 4.55 4.20 4.21  HGB 10.5* 9.5* 9.7*  HCT 34.7* 31.8* 31.9*  MCV 76.3* 75.7*  75.8*  MCH 23.1* 22.6* 23.0*  MCHC 30.3 29.9* 30.4  RDW 19.5* 19.6* 19.5*  PLT 242 180 199    Cardiac Enzymes Recent Labs  Lab 03/06/18 1803 03/06/18 2328  TROPONINI <0.03 <0.03    Recent Labs  Lab 03/06/18 1137  TROPIPOC 0.01     BNP Recent Labs  Lab 03/06/18 1352  BNP 790.8*     DDimer No results for input(s): DDIMER in the last 168 hours.   Radiology    No results found.  Cardiac Studies   Normal EF, grade 3 diastolic dysfunction  Patient Profile     77 y.o. female here with acute on chronic diastolic heart failure, lower extremity edema  Assessment & Plan    Acute on chronic diastolic heart failure -Worsening edema orthopnea, was frustrated.  Good urine output yesterday.  6.3 net.  Lasix 80 mg IV every 12.  Overall improved.  She is feeling better.  Weight is down to 135.  I say we give her another day of diuresis.  Mechanical AVR, click present, INR currently 2.2, therapeutic.  Goal for  mechanical aortic valve is to 2.0-3.0.  For questions or updates, please contact [place] Please consult [email or web address] for contact info under Cardiology/STEMI.      Signed, Candee Furbish, MD  03/10/2018, 10:21 AM

## 2018-03-11 ENCOUNTER — Other Ambulatory Visit (HOSPITAL_COMMUNITY): Payer: Medicare HMO

## 2018-03-11 DIAGNOSIS — I272 Pulmonary hypertension, unspecified: Secondary | ICD-10-CM

## 2018-03-11 DIAGNOSIS — I10 Essential (primary) hypertension: Secondary | ICD-10-CM

## 2018-03-11 DIAGNOSIS — E871 Hypo-osmolality and hyponatremia: Secondary | ICD-10-CM

## 2018-03-11 DIAGNOSIS — J9601 Acute respiratory failure with hypoxia: Secondary | ICD-10-CM

## 2018-03-11 LAB — PROTIME-INR
INR: 1.87
Prothrombin Time: 21.4 seconds — ABNORMAL HIGH (ref 11.4–15.2)

## 2018-03-11 LAB — BASIC METABOLIC PANEL
Anion gap: 5 (ref 5–15)
BUN: 22 mg/dL — ABNORMAL HIGH (ref 6–20)
CALCIUM: 8.2 mg/dL — AB (ref 8.9–10.3)
CHLORIDE: 96 mmol/L — AB (ref 101–111)
CO2: 33 mmol/L — AB (ref 22–32)
CREATININE: 0.96 mg/dL (ref 0.44–1.00)
GFR calc non Af Amer: 55 mL/min — ABNORMAL LOW (ref >60)
GLUCOSE: 103 mg/dL — AB (ref 65–99)
Potassium: 3.9 mmol/L (ref 3.5–5.1)
Sodium: 134 mmol/L — ABNORMAL LOW (ref 135–145)

## 2018-03-11 LAB — GLUCOSE, CAPILLARY
GLUCOSE-CAPILLARY: 138 mg/dL — AB (ref 65–99)
GLUCOSE-CAPILLARY: 128 mg/dL — AB (ref 65–99)
GLUCOSE-CAPILLARY: 121 mg/dL — AB (ref 65–99)
Glucose-Capillary: 132 mg/dL — ABNORMAL HIGH (ref 65–99)
Glucose-Capillary: 148 mg/dL — ABNORMAL HIGH (ref 65–99)

## 2018-03-11 MED ORDER — ENOXAPARIN SODIUM 60 MG/0.6ML ~~LOC~~ SOLN
60.0000 mg | Freq: Two times a day (BID) | SUBCUTANEOUS | Status: DC
  Administered 2018-03-11 – 2018-03-12 (×4): 60 mg via SUBCUTANEOUS
  Filled 2018-03-11 (×6): qty 0.6

## 2018-03-11 MED ORDER — WARFARIN SODIUM 7.5 MG PO TABS
7.5000 mg | ORAL_TABLET | Freq: Once | ORAL | Status: AC
  Administered 2018-03-11: 7.5 mg via ORAL
  Filled 2018-03-11: qty 1

## 2018-03-11 NOTE — Progress Notes (Signed)
ANTICOAGULATION CONSULT NOTE - Follow Up Consult  Pharmacy Consult for Coumadin Indication: atrial fibrillation and mechanical AVR  Labs: Recent Labs    03/09/18 0644 03/10/18 0702 03/11/18 0439  LABPROT 31.7* 24.4* 21.4*  INR 3.10 2.21 1.87  CREATININE 0.88 1.01* 0.96   Assessment: 77 yo F on Coumadin PTA for mechanical AVR and afib.  Coumadin was held last week for cholecystectomy.  Pt was discharged 5/28 s/p cholecystectomy. Returned to ED with SOB.  Noted to have elevated INR on presentation.  Suspect elevated INR is a combination of higher doses given during last admission and decreased PO intake since procedure.  INR has continued to trend down likely 2/2 previous held doses on 5/29-30. Cardiology note from 6/2 has INR goal as 2-3, however clinic notes starting back in 2014 have INR goal 2.5-3.5. Discussed INR goal with cardiology extender who will address in her note after she sees the patient today. Either way INR is 1.87 today and the primary team has asked pharmacy to bridge with Lovenox.  PTA Dosing: 7.5mg  MWF, 5mg  TTSS, admit INR was 4.5  Goal of Therapy:  INR 2.5-3.5 Monitor platelets by anticoagulation protocol: Yes   Plan:  Coumadin 7.5g PO x 1 tonight Lovenox 60mg  sub-Q BID (while INR subtherapeutic) Continue daily INR  Georga Bora, PharmD Clinical Pharmacist 03/11/2018 10:19 AM

## 2018-03-11 NOTE — Progress Notes (Signed)
PROGRESS NOTE    [name]  WCB:[id number] DOB: [birth year] DOA: 03/06/2018 PCP: [name], MD   Brief Narrative:77 y.o.femalewith medical history significant forhypertension, chronic grade 3 diastolic heart failure, deficiency anemia, chronic atrial fibrillation chronic status post AVR on warfarin, diabetes on oral agents. Patient was discharged on 5/28 after protracted hospitalization in the context of calculus cholecystitis status post cholecystectomy. Surgery was delayed in the context of supratherapeutic INR and need for vitamin K reversal. Anticoagulation was resumed on 5/24 on the afternoon of surgery. Patient was noted with lower extremity edema and chest x-ray positive for heart failure 5/27 and was given an IV Lasix dose of 20 mg x 1. Patient was not on oxygen priorto admission but was discharged home on 2 L oxygen. Patient reports over the past 24 hours she has noted increased shortness of breath, productive cough of clear to pinkish tinged sputum. And worsening lower extremity edema. Of note weight at time of admission on 5/17 was 139 lbs. Today's weight is 149 lbs. Chest x-ray in the ER consistent with CHF and BNP elevated at 791. Patient has been given a dose of Lasix 60 mg IV x1 in the ER noting home dose of 60 mg daily. ED Course: Vital Signs:BP (!) 147/104  Pulse 74  Temp 98.3 F (36.8 C) (Skin)  Resp 18  Ht 5\' 1"  (1.549 m)  Wt 67.6 kg (149 lb)  SpO2 93%  BMI 28.15 kg/m  CXR:CHF with venous hypertension and interstitial edema,large and effusions Lab data:Na 131, potassium 3.9, chloride 100, CO2 24, glucose 132, BUN 15, creatinine 0.91, anion gap 7, BNP 791, point-of-care troponin normal, white count 2600 differential not obtained, hemoglobin 9.7, platelets 199,000-INR 4.58 Medications and treatments:Lasix 60 mg IV x1     Assessment & Plan:   Principal Problem:   Acute on chronic diastolic heart failure (HCC) Active Problems:  Diabetes mellitus type 2, controlled (HCC)   Chronic atrial fibrillation (HCC)   S/P cholecystectomy   HTN (hypertension)   H/O mechanical aortic valve replacement   Acute hyponatremia   Acute respiratory failure with hypoxia (HCC)   Moderate to severe pulmonary hypertension (HCC)  1]acute on chronic hypoxic respiratory failure secondary to CHF diastolic exacerbation patient admitted with increasing shortness of breath increasing edema chest pressure and orthopnea.chest x-ray findings consistent with heart failure and pulmonary edema elevated BNP. Continue IV Lasix and potassium supplementation.Continue ACE inhibitor and beta-blocker. Monitor renal functions. Her weight has come down from 146 to 135 pounds.Will not repeat echo recent echo was ejection fraction 55 to 60% with pulmonary hypertension 88 mmHg. She is negative by2800cc since admit. Plan per cardiology.  Patient with significant edema 2-3+ bilateral lower extremity which is also probably related to low albumin and malnutrition.SHE ACTUALLY looks a lot better today than last 4 days.DEFER TO CARDIOLOGY FOR INR GOAL.2 TO 3 OR 2.5 TO 3.5?  2]status post recent cholecystectomy 5/24 /19-continues to have postprandial diarrhea continue Florastor addcholestyramine.Better.  3]chronic atrial fibrillation /mechanical aortic valve replacement. Continue beta-blocker. Patient is on Coumadin with supratherapeutic INR pharmacy following.INR down to 1.9 pharmacy following. Patient very concerned about erratic INR levels.lovenox today to get inr up.  4]hypertension continue Avapro and metoprolol.  5]type 2 diabetes continue glipizide metformin being on hold continue SSI.  6]hyperlipidemia continue Lipitor.  7]moderate malnutrition secondary to chronic illness. Followed by nutritionist.Evidenced by fat depletion and edema. Continue with multivitamin and minerals daily. Glucerna and Prostat         DVT  prophylaxis: coumadin Code Status partial Family Communication:none Disposition Plan dc when cardiology says ok  Consultants: [facility] Procedures:none Antimicrobials:none Subjective:feels better  Objective: Vitals:   03/10/18 2047 03/11/18 0247 03/11/18 0617 03/11/18 1224  BP: (!) 142/61  (!) 162/58 (!) 147/47  Pulse: 72  78 67  Resp: 20  20 20   Temp: 98.1 F (36.7 C)  98 F (36.7 C) 98.2 F (36.8 C)  TempSrc: Oral  Oral Oral  SpO2: 97%  97% 97%  Weight:  58.5 kg (129 lb)    Height:        Intake/Output Summary (Last 24 hours) at 03/11/2018 1330 Last data filed at 03/11/2018 1254 Gross per 24 hour  Intake 720 ml  Output 2700 ml  Net -1980 ml   Filed Weights   03/09/18 0100 03/09/18 0525 03/11/18 0247  Weight: 61.3 kg (135 lb 3.2 oz) 61.3 kg (135 lb 3.2 oz) 58.5 kg (129 lb)    Examination:walking with PT.[name]  General exam: Appears calm and comfortable  Respiratory system: Clear to auscultation. Respiratory effort normal. Cardiovascular system: S1 & S2 heard, RRR. No JVD, murmurs, rubs, gallops or clicks. No pedal edema. Gastrointestinal system: Abdomen is nondistended, soft and nontender. No organomegaly or masses felt. Normal bowel sounds heard. Central nervous system: Alert and oriented. No focal neurological deficits. Extremities: deceresed edema Skin: No rashes, lesions or ulcers Psychiatry: Judgement and insight appear normal. Mood & affect appropriate.     Data Reviewed: I have personally reviewed following labs and imaging studies  CBC: Recent Labs  Lab 03/05/18 0702 03/06/18 1128  WBC 4.1 2.6*  HGB 9.5* 9.7*  HCT 31.8* 31.9*  MCV 75.7* 75.8*  PLT 180 300   Basic Metabolic Panel: Recent Labs  Lab 03/07/18 0433 03/08/18 0430 03/09/18 0644 03/10/18 0702 03/11/18 0439  NA 134* 131* 135 133* 134*  K 3.9 3.9 3.7 4.2 3.9  CL 101 95* 98* 93* 96*  CO2 26 28 31 31  33*  GLUCOSE 89 125* 101* 111* 103*  BUN 15 13 17 19  22*  CREATININE 0.87 0.88 0.88 1.01*  0.96  CALCIUM 8.5* 8.6* 8.3* 8.7* 8.2*   GFR: Estimated Creatinine Clearance: 39.1 mL/min (by C-G formula based on SCr of 0.96 mg/dL). Liver Function Tests: No results for input(s): AST, ALT, ALKPHOS, BILITOT, PROT, ALBUMIN in the last 168 hours. No results for input(s): LIPASE, AMYLASE in the last 168 hours. No results for input(s): AMMONIA in the last 168 hours. Coagulation Profile: Recent Labs  Lab 03/07/18 0433 03/08/18 0430 03/09/18 0644 03/10/18 0702 03/11/18 0439  INR 5.80* 3.88 3.10 2.21 1.87   Cardiac Enzymes: Recent Labs  Lab 03/06/18 1803 03/06/18 2328  TROPONINI <0.03 <0.03   BNP (last 3 results) No results for input(s): PROBNP in the last 8760 hours. HbA1C: No results for input(s): HGBA1C in the last 72 hours. CBG: Recent Labs  Lab 03/10/18 1133 03/10/18 1724 03/10/18 2047 03/11/18 0757 03/11/18 1119  GLUCAP 139* 135* 120* 138* 128*   Lipid Profile: No results for input(s): CHOL, HDL, LDLCALC, TRIG, CHOLHDL, LDLDIRECT in the last 72 hours. Thyroid Function Tests: No results for input(s): TSH, T4TOTAL, FREET4, T3FREE, THYROIDAB in the last 72 hours. Anemia Panel: No results for input(s): VITAMINB12, FOLATE, FERRITIN, TIBC, IRON, RETICCTPCT in the last 72 hours. Sepsis Labs: No results for input(s): PROCALCITON, LATICACIDVEN in the last 168 hours.  No results found for this or any previous visit (from the past 240 hour(s)).       Radiology Studies: No  results found.      Scheduled Meds: . ALPRAZolam  0.25-0.5 mg Oral See admin instructions  . aspirin EC  81 mg Oral Q M,W,F  . atorvastatin  80 mg Oral Q supper  . cholecalciferol  1,000 Units Oral Daily  . cholestyramine  4 g Oral TID  . enoxaparin (LOVENOX) injection  60 mg Subcutaneous BID  . feeding supplement (GLUCERNA SHAKE)  237 mL Oral BID BM  . feeding supplement (PRO-STAT SUGAR FREE 64)  30 mL Oral BID  . furosemide  80 mg Intravenous Q12H  . insulin aspart  0-5 Units  Subcutaneous QHS  . insulin aspart  0-9 Units Subcutaneous TID WC  . irbesartan  300 mg Oral Daily  . magnesium oxide  400 mg Oral Daily  . metoprolol tartrate  50 mg Oral BID  . miconazole   Topical BID  . multivitamin with minerals  1 tablet Oral Daily  . pantoprazole  40 mg Oral Daily  . potassium chloride  20 mEq Oral BID  . saccharomyces boulardii  250 mg Oral BID  . sodium chloride flush  3 mL Intravenous Q12H  . warfarin  7.5 mg Oral ONCE-1800  . Warfarin - Pharmacist Dosing Inpatient   Does not apply q1800   Continuous Infusions: . sodium chloride       LOS: 5 days     [name], MD [facility]  If 7PM-7AM, please contact night-coverage [email or web address] Password [email or web address] 03/11/2018, 1:30 PM

## 2018-03-11 NOTE — Care Management Important Message (Signed)
Important Message  Patient Details  Name: [name] MRN: [id number] Date of Birth: July 30, 1941   Medicare Important Message Given:  Yes    Barb Merino Claudius Mich 03/11/2018, 3:37 PM

## 2018-03-11 NOTE — Progress Notes (Signed)
patient is concerned about taking another dose of lasix. Want to wait til MD rounds before she take another dose.

## 2018-03-11 NOTE — Progress Notes (Signed)
Progress Note  Patient Name: [name] Date of Encounter: 03/11/2018  Primary Cardiologist: Quay Burow, MD   Subjective   Feeling a bit better but still on supplemental O2.   Inpatient Medications    Scheduled Meds: . ALPRAZolam  0.25-0.5 mg Oral See admin instructions  . aspirin EC  81 mg Oral Q M,W,F  . atorvastatin  80 mg Oral Q supper  . cholecalciferol  1,000 Units Oral Daily  . cholestyramine  4 g Oral TID  . feeding supplement (GLUCERNA SHAKE)  237 mL Oral BID BM  . feeding supplement (PRO-STAT SUGAR FREE 64)  30 mL Oral BID  . furosemide  80 mg Intravenous Q12H  . insulin aspart  0-5 Units Subcutaneous QHS  . insulin aspart  0-9 Units Subcutaneous TID WC  . irbesartan  300 mg Oral Daily  . magnesium oxide  400 mg Oral Daily  . metoprolol tartrate  50 mg Oral BID  . miconazole   Topical BID  . multivitamin with minerals  1 tablet Oral Daily  . pantoprazole  40 mg Oral Daily  . potassium chloride  20 mEq Oral BID  . saccharomyces boulardii  250 mg Oral BID  . sodium chloride flush  3 mL Intravenous Q12H  . Warfarin - Pharmacist Dosing Inpatient   Does not apply q1800   Continuous Infusions: . sodium chloride     PRN Meds: sodium chloride, acetaminophen, guaiFENesin, ondansetron (ZOFRAN) IV, polyvinyl alcohol, sodium chloride flush, traMADol   Vital Signs    Vitals:   03/10/18 1145 03/10/18 2047 03/11/18 0247 03/11/18 0617  BP: (!) 133/47 (!) 142/61  (!) 162/58  Pulse: (!) 58 72  78  Resp: 18 20  20   Temp: (!) 97.5 F (36.4 C) 98.1 F (36.7 C)  98 F (36.7 C)  TempSrc: Oral Oral  Oral  SpO2: 99% 97%  97%  Weight:   129 lb (58.5 kg)   Height:        Intake/Output Summary (Last 24 hours) at 03/11/2018 1020 Last data filed at 03/11/2018 0853 Gross per 24 hour  Intake 720 ml  Output 1500 ml  Net -780 ml   Filed Weights   03/09/18 0100 03/09/18 0525 03/11/18 0247  Weight: 135 lb 3.2 oz (61.3 kg) 135 lb 3.2 oz (61.3 kg) 129 lb (58.5 kg)     Telemetry    Atrial fibrillation with CVR - Personally Reviewed  ECG    Chronic Atrial fibrillation  - Personally Reviewed  Physical Exam   GEN: No acute distress.   Neck: No JVD Cardiac: irreguarlly irregular rhythm, regular rate, crisp mechanical valve sounds Respiratory: Clear to auscultation bilaterally. GI: Soft, nontender, non-distended  MS: 2+ bilateral LEE Neuro:  Nonfocal  Psych: Normal affect   Labs    Chemistry Recent Labs  Lab 03/09/18 0644 03/10/18 0702 03/11/18 0439  NA 135 133* 134*  K 3.7 4.2 3.9  CL 98* 93* 96*  CO2 31 31 33*  GLUCOSE 101* 111* 103*  BUN 17 19 22*  CREATININE 0.88 1.01* 0.96  CALCIUM 8.3* 8.7* 8.2*  GFRNONAA >60 52* 55*  GFRAA >60 60* >60  ANIONGAP 6 9 5      Hematology Recent Labs  Lab 03/05/18 0702 03/06/18 1128  WBC 4.1 2.6*  RBC 4.20 4.21  HGB 9.5* 9.7*  HCT 31.8* 31.9*  MCV 75.7* 75.8*  MCH 22.6* 23.0*  MCHC 29.9* 30.4  RDW 19.6* 19.5*  PLT 180 199    Cardiac Enzymes  Recent Labs  Lab 03/06/18 1803 03/06/18 2328  TROPONINI <0.03 <0.03    Recent Labs  Lab 03/06/18 1137  TROPIPOC 0.01     BNP Recent Labs  Lab 03/06/18 1352  BNP 790.8*     DDimer No results for input(s): DDIMER in the last 168 hours.   Radiology    No results found.  Cardiac Studies   Echo 02/24/2018 Study Conclusions  - Left ventricle: The cavity size was normal. Wall thickness was normal. Systolic function was normal. The estimated ejection fraction was in the range of 55% to 60%. Wall motion was normal; there were no regional wall motion abnormalities. Doppler parameters are consistent with a reversible restrictive pattern, indicative of decreased left ventricular diastolic compliance and/or increased left atrial pressure (grade 3 diastolic dysfunction). - Ventricular septum: The contour showed diastolic flattening. - Aortic valve: A mechanical prosthesis was present and functioning normally.  Peak velocity (S): 238 cm/s. Mean gradient (S): 10 mm Hg. Valve area (VTI): 2.24 cm^2. Valve area (Vmax): 2.26 cm^2. Valve area (Vmean): 1.88 cm^2. - Mitral valve: Calcified annulus. There was mild regurgitation. Valve area by continuity equation (using LVOT flow): 2.53 cm^2. - Left atrium: The atrium was moderately to severely dilated. - Right ventricle: The cavity size was moderately dilated. Wall thickness was normal. Systolic function was moderately to severely reduced. - Right atrium: The atrium was severely dilated. - Tricuspid valve: There was severe regurgitation. - Pulmonary arteries: Systolic pressure was severely increased. PA peak pressure: 88 mm Hg (S).  Impressions:  - When compared to prior, PA pressures have increased.   Patient Profile     [name] is a 77 y.o. female with a hx of CAD, CABG in 07/2004 X2 and AVR with St. Jude valve -on coumadin and PVOD, permanent a fib, HTN, HLD, diabetes and recent admission 5/16-5/28 for cholelithiasis and subacute cholecystitis s/p cholecystectomy. Readmitted day after discharge for acute hypoxic respiratory failure, secondary to acute CHF/ volume overload.   Assessment & Plan    1. Acute on Chronic Diastolic HF: continues to diurese. Total UOP yesterday was 2.4 L. Net I/Os negative 7.45 L since admit. Weight is improved over the past 24 hrs, down from 135 lb to 129 lb. Still volume overloaded with 2+ bilateral LEE pitting edema on exam. Needs further diuresis. SCr and K WNL. Na 134. BP stable. Continue IV Lasix 80 mg BID. Continue strict I/Os, daily weights and monitoring of renal function and electrolytes.   2. CAD: s/p CABG in 2005. Stable w/o anginal symptoms.   3. Chronic Atrial Fibrillation: rate is controlled w/ metoprolol. On coumadin for anticoagulation.   4. Mechanical Aortic Valve: St Jude mechanical valve. Crisp sounds auscultated on exam.    5. Chronic Anticoagulation: on coumadin for mechanical  aortic valve and for stroke prophylaxis for atrial fibrillation. Pharmacy has noticed discrepancy between her INR goal we have targeted in the outpatient setting and INR goal outlined in notes since being admitted. Previous goal has been 2.5-3.5, but it was outlined in note 03/10/18 to target INR between 2.0 and 3.0. Will discuss with MD and decide on goal.   6. HTN: elevated this morning, 162/58, but this was prior to meds. Repeat BP and monitor.   For questions or updates, please contact [place] Please consult [email or web address] for contact info under Cardiology/STEMI.      Signed, Lyda Jester, PA-C  03/11/2018, 10:20 AM

## 2018-03-11 NOTE — Evaluation (Addendum)
Physical Therapy Evaluation Patient Details Name: [name] MRN: [id number] DOB: 12-15-1940 Today's Date: 03/11/2018   History of Present Illness  [name] is a 77 y.o. female with medical history significant for hypertension, chronic grade 3 diastolic heart failure, deficiency anemia, chronic atrial fibrillation chronic status post AVR on warfarin, diabetes on oral agents. Pt recently d/c from hospital on 5/28 s/p cholecystecomty. Pt came to ED with SOB and chest pressure.  Clinical Impression  Pt admitted with above. Pt with bilat LE edema, Pt reports bilat ted hose hurting her leg. Dr. Cecil Cobbs her to take it off for a little while. Pt to benefit from an OT consult to assess dressing.  Pt SpO2 at 94% on 2LO2 via [place]. Pt functioning at supervision with RW. Pt reports having a supportive son and neighbor that can be with her 31 of time.  Follow Up Recommendations Home health PT;Supervision for mobility/OOB    Equipment Recommendations  Other (comment) tub bench   Recommendations for Other Services OT consult     Precautions / Restrictions Precautions Precautions: Fall Precaution Comments: watch O2 Restrictions Weight Bearing Restrictions: No      Mobility  Bed Mobility Overal bed mobility: Modified Independent             General bed mobility comments: pt able to transfer self to EOB with HOB flat and use of bed rail  Transfers Overall transfer level: Needs assistance Equipment used: None Transfers: Sit to/from Stand Sit to Stand: Supervision         General transfer comment: pt steady, used bed rail and then furtniture walked to the bathroom  Ambulation/Gait Ambulation/Gait assistance: Supervision Ambulation Distance (Feet): 150 Feet Assistive device: Rolling walker (2 wheeled) Gait Pattern/deviations: Step-through pattern;Decreased stride length Gait velocity: Decreased  Gait velocity interpretation: <1.8 ft/sec, indicate of risk for  recurrent falls General Gait Details: Slow, cautious gait. no unsteadiness noted. Required standing rest breaks secondary to fatigue. Needed O2 at 2L to keep sats >90%  Stairs            Wheelchair Mobility    Modified Rankin (Stroke Patients Only)       Balance Overall balance assessment: Needs assistance Sitting-balance support: No upper extremity supported;Feet supported Sitting balance-Leahy Scale: Good     Standing balance support: No upper extremity supported;During functional activity Standing balance-Leahy Scale: Fair Standing balance comment: Able to maintain static standing during ADL tasks                              Pertinent Vitals/Pain Pain Assessment: 0-10 Pain Score: 8  Pain Location: bilat LEs Pain Descriptors / Indicators: Sore(hypertension) Pain Intervention(s): Monitored during session(MD stated she could take them off for a little bit)    Home Living Family/patient expects to be discharged to:: Private residence Living Arrangements: Alone Available Help at Discharge: Family;Available PRN/intermittently;Neighbor Type of Home: House Home Access: Stairs to enter Entrance Stairs-Rails: Right Entrance Stairs-Number of Steps: 3 Home Layout: One level Home Equipment: Walker - 2 wheels      Prior Function Level of Independence: Independent   Gait / Transfers Assistance Needed: Used RW for ambulation   ADL's / Homemaking Assistance Needed: Required assist for bathing.   Comments: pt reports she was driving and doin grocery shopping up until march of this year. Pt also reports "my neighbor stays with me at night"     Hand Dominance   Dominant Hand:  Right    Extremity/Trunk Assessment   Upper Extremity Assessment Upper Extremity Assessment: Generalized weakness    Lower Extremity Assessment Lower Extremity Assessment: Generalized weakness(bilat LE pitting edema)    Cervical / Trunk Assessment Cervical / Trunk Assessment:  Normal  Communication   Communication: No difficulties  Cognition Arousal/Alertness: Awake/alert Behavior During Therapy: WFL for tasks assessed/performed Overall Cognitive Status: Within Functional Limits for tasks assessed                                        General Comments      Exercises     Assessment/Plan    PT Assessment Patient needs continued PT services  PT Problem List Decreased strength;Decreased balance;Decreased activity tolerance;Decreased mobility;Decreased knowledge of use of DME;Decreased knowledge of precautions;Cardiopulmonary status limiting activity       PT Treatment Interventions DME instruction;Gait training;Stair training;Functional mobility training;Therapeutic activities;Therapeutic exercise;Balance training;Patient/family education    PT Goals (Current goals can be found in the Care Plan section)  Acute Rehab PT Goals Patient Stated Goal: go home PT Goal Formulation: With patient Time For Goal Achievement: 03/25/18    Frequency Min 3X/week   Barriers to discharge Decreased caregiver support lives alone but reports son checks on her daily    Co-evaluation               AM-PAC PT "6 Clicks" Daily Activity  Outcome Measure Difficulty turning over in bed (including adjusting bedclothes, sheets and blankets)?: A Little Difficulty moving from lying on back to sitting on the side of the bed? : A Little Difficulty sitting down on and standing up from a chair with arms (e.g., wheelchair, bedside commode, etc,.)?: A Little Help needed moving to and from a bed to chair (including a wheelchair)?: A Little Help needed walking in hospital room?: A Little Help needed climbing 3-5 steps with a railing? : A Little 6 Click Score: 18    End of Session Equipment Utilized During Treatment: Gait belt;Oxygen Activity Tolerance: Patient tolerated treatment well Patient left: in chair;with call bell/phone within reach;with family/visitor  present Nurse Communication: Mobility status;Other (comment) PT Visit Diagnosis: Unsteadiness on feet (R26.81);Muscle weakness (generalized) (M62.81)    Time: 4920-1007 PT Time Calculation (min) (ACUTE ONLY): 24 min   Charges:   PT Evaluation $PT Eval Moderate Complexity: 1 Mod PT Treatments $Gait Training: 8-22 mins   PT G Codes:        Kittie Plater, PT, DPT Pager #: [phone] Office #: [phone]   English 03/11/2018, 8:48 AM

## 2018-03-12 DIAGNOSIS — E440 Moderate protein-calorie malnutrition: Secondary | ICD-10-CM

## 2018-03-12 LAB — GLUCOSE, CAPILLARY
GLUCOSE-CAPILLARY: 121 mg/dL — AB (ref 65–99)
GLUCOSE-CAPILLARY: 138 mg/dL — AB (ref 65–99)
GLUCOSE-CAPILLARY: 169 mg/dL — AB (ref 65–99)
Glucose-Capillary: 191 mg/dL — ABNORMAL HIGH (ref 65–99)

## 2018-03-12 LAB — PROTIME-INR
INR: 1.92
Prothrombin Time: 21.8 seconds — ABNORMAL HIGH (ref 11.4–15.2)

## 2018-03-12 MED ORDER — HYDROCORTISONE 0.5 % EX OINT
1.0000 "application " | TOPICAL_OINTMENT | Freq: Two times a day (BID) | CUTANEOUS | Status: DC
  Administered 2018-03-12 (×2): 1 via TOPICAL
  Filled 2018-03-12: qty 28.35

## 2018-03-12 MED ORDER — WARFARIN SODIUM 5 MG PO TABS
5.0000 mg | ORAL_TABLET | Freq: Once | ORAL | Status: AC
  Administered 2018-03-12: 5 mg via ORAL
  Filled 2018-03-12: qty 1

## 2018-03-12 NOTE — Progress Notes (Signed)
Pt with new generalized rash. Pt reporting this began yesterday and reports itching associated with raised areas. Pt states only new medication started was Eliquis, Md aware. Will continue to monitor.

## 2018-03-12 NOTE — Progress Notes (Signed)
Physical Therapy Treatment Patient Details Name: [name] MRN: [id number] DOB: 11-04-1940 Today's Date: 03/12/2018    History of Present Illness [name] is a 77 y.o. female with medical history significant for hypertension, chronic grade 3 diastolic heart failure, deficiency anemia, chronic atrial fibrillation chronic status post AVR on warfarin, diabetes on oral agents. Pt recently d/c from hospital on 5/28 s/p cholecystecomty. Pt came to ED with SOB and chest pressure.    PT Comments    Pt progressing well. Pt able to amb 175' on RA while maintaining SPO2 >88%. Pt remains to have edema in bilat LEs. Per Dr. Zigmund Daniel pt to where ted hose during the day to assist with swelling. Pt resistant due to pain with them on and not being able to put them on herself. Recommending a OT consult to address ADLs. Acute PT to con't to follow.    Follow Up Recommendations  Home health PT;Supervision for mobility/OOB     Equipment Recommendations  Other (comment)    Recommendations for Other Services OT consult     Precautions / Restrictions Precautions Precautions: Fall Restrictions Weight Bearing Restrictions: No    Mobility  Bed Mobility Overal bed mobility: Modified Independent             General bed mobility comments: pt able to transfer self to EOB with HOB flat and use of bed rail  Transfers Overall transfer level: Needs assistance Equipment used: None Transfers: Sit to/from Stand Sit to Stand: Supervision         General transfer comment: pt steady, used bed rail and then furtniture walked to the bathroom  Ambulation/Gait Ambulation/Gait assistance: Supervision Ambulation Distance (Feet): 170 Feet Assistive device: Rolling walker (2 wheeled) Gait Pattern/deviations: Step-through pattern Gait velocity: decreased Gait velocity interpretation: <1.31 ft/sec, indicative of household ambulator General Gait Details: increased cadence from yesterday but over all  remains slow, pt SpO2 >88% on RA during ambulation   Stairs             Wheelchair Mobility    Modified Rankin (Stroke Patients Only)       Balance Overall balance assessment: Needs assistance Sitting-balance support: No upper extremity supported;Feet supported Sitting balance-Leahy Scale: Good     Standing balance support: During functional activity;No upper extremity supported Standing balance-Leahy Scale: Fair Standing balance comment: able to wash hands and perform pericare in standing without assist                            Cognition Arousal/Alertness: Awake/alert Behavior During Therapy: WFL for tasks assessed/performed Overall Cognitive Status: Within Functional Limits for tasks assessed                                        Exercises      General Comments        Pertinent Vitals/Pain Pain Assessment: Faces Faces Pain Scale: Hurts even more Pain Location: bilat LEs during donning of ted hose Pain Descriptors / Indicators: Sore Pain Intervention(s): Monitored during session    Home Living                      Prior Function            PT Goals (current goals can now be found in the care plan section) Acute Rehab PT Goals Patient Stated Goal:  get off O2 Progress towards PT goals: Progressing toward goals    Frequency    Min 3X/week      PT Plan Current plan remains appropriate    Co-evaluation              AM-PAC PT "6 Clicks" Daily Activity  Outcome Measure  Difficulty turning over in bed (including adjusting bedclothes, sheets and blankets)?: A Little Difficulty moving from lying on back to sitting on the side of the bed? : A Little Difficulty sitting down on and standing up from a chair with arms (e.g., wheelchair, bedside commode, etc,.)?: A Little Help needed moving to and from a bed to chair (including a wheelchair)?: A Little Help needed walking in hospital room?: A Little Help  needed climbing 3-5 steps with a railing? : A Little 6 Click Score: 18    End of Session Equipment Utilized During Treatment: Gait belt Activity Tolerance: Patient tolerated treatment well Patient left: with call bell/phone within reach;in bed(sitting EOB) Nurse Communication: Mobility status;Other (comment) PT Visit Diagnosis: Unsteadiness on feet (R26.81);Muscle weakness (generalized) (M62.81)     Time: 2919-1660 PT Time Calculation (min) (ACUTE ONLY): 26 min  Charges:  $Gait Training: 8-22 mins $Therapeutic Activity: 8-22 mins                    G Codes:       Kittie Plater, PT, DPT Pager #: [phone] Office #: [phone]    [place] 03/12/2018, 10:22 AM

## 2018-03-12 NOTE — Progress Notes (Signed)
PROGRESS NOTE    IRIDIANA FONNER  KWI:[id number] DOB: 17-Jan-1941 DOA: 03/06/2018 PCP: [name], MD   Brief Narrative:77 y.o.femalewith medical history significant forhypertension, chronic grade 3 diastolic heart failure, deficiency anemia, chronic atrial fibrillation chronic status post AVR on warfarin, diabetes on oral agents. Patient was discharged on 5/28 after protracted hospitalization in the context of calculus cholecystitis status post cholecystectomy. Surgery was delayed in the context of supratherapeutic INR and need for vitamin K reversal. Anticoagulation was resumed on 5/24 on the afternoon of surgery. Patient was noted with lower extremity edema and chest x-ray positive for heart failure 5/27 and was given an IV Lasix dose of 20 mg x 1. Patient was not on oxygen priorto admission but was discharged home on 2 L oxygen. Patient reports over the past 24 hours she has noted increased shortness of breath, productive cough of clear to pinkish tinged sputum. And worsening lower extremity edema. Of note weight at time of admission on 5/17 was 139 lbs. Today's weight is 149 lbs. Chest x-ray in the ER consistent with CHF and BNP elevated at 791. Patient has been given a dose of Lasix 60 mg IV x1 in the ER noting home dose of 60 mg daily. ED Course: Vital Signs:BP (!) 147/104  Pulse 74  Temp 98.3 F (36.8 C) (Skin)  Resp 18  Ht 5\' 1"  (1.549 m)  Wt 67.6 kg (149 lb)  SpO2 93%  BMI 28.15 kg/m  CXR:CHF with venous hypertension and interstitial edema,large and effusions Lab data:Na 131, potassium 3.9, chloride 100, CO2 24, glucose 132, BUN 15, creatinine 0.91, anion gap 7, BNP 791, point-of-care troponin normal, white count 2600 differential not obtained, hemoglobin 9.7, platelets 199,000-INR 4.58 Medications and treatments:Lasix 60 mg IV x1     Assessment & Plan:   Principal Problem:   Acute on chronic diastolic heart failure (HCC) Active Problems:  Diabetes mellitus type 2, controlled (HCC)   Chronic atrial fibrillation (HCC)   S/P cholecystectomy   HTN (hypertension)   H/O mechanical aortic valve replacement   Acute hyponatremia   Acute respiratory failure with hypoxia (HCC)   Moderate to severe pulmonary hypertension (HCC)   Malnutrition of moderate degree  1]acute on chronic hypoxic respiratory failure secondary to CHF diastolic exacerbation patient admitted with increasing shortness of breath increasing edema chest pressure and orthopnea.chest x-ray findings consistent with heart failure and pulmonary edema elevated BNP. Continue IV Lasix and potassium supplementation.Continue ACE inhibitor and beta-blocker. Monitor renal functions. Her weight has come down from 146 to 135 pounds.Will not repeat echo recent echo was ejection fraction 55 to 60% with pulmonary hypertension 88 mmHg. She is negative by9700cc since admit. Plan per cardiology.  2]status post recent cholecystectomy 5/24 /19  3]chronic atrial fibrillation /mechanical aortic valve replacement. Continue beta-blocker. Patient is on Coumadin with supratherapeutic INR pharmacy following.INR down to 1.9 pharmacy following. Patient very concerned about erratic INR levels.lovenox today to get inr up.  Defer to cardiology if hydralazine need to be restarted.  4]hypertension continue Avapro and metoprolol.  5]type 2 diabetes continue glipizide metformin being on hold continue SSI.  6]hyperlipidemia continue Lipitor. 7]moderate malnutrition secondary to chronic illness. Followed by nutritionist.Evidenced by fat depletion and edema. Continue with multivitamin and minerals daily. Glucerna and Prostat  8] new rash only new medications that was started with Questran and Florastor which I will stop today and observe.  Meanwhile hydrocortisone lotion to the rash areas.        DVT prophylaxis Coumadin and Lovenox  Code Status full code Family  Communication: No family available Disposition Plan: DC when cardiology is okay.  Waiting for INR to be therapeutic and for diuresing.  Hopefully Lasix will be changed to p.o. today.   Consultants: Cardiology  Procedures: None Antimicrobials: None Subjective: Feels better had the TED hose on yesterday and while I was in the room OT just put the TED hose on her again and ambulated her in the hallway without oxygen saturation 92%.  She also reports a new rash on her legs and on her back which is itchy. Objective: Vitals:   03/11/18 1224 03/11/18 2007 03/12/18 0421 03/12/18 0430  BP: (!) 147/47 (!) 169/52 (!) 151/61   Pulse: 67 76 60   Resp: 20 20 12    Temp: 98.2 F (36.8 C) 98.1 F (36.7 C) 98.6 F (37 C)   TempSrc: Oral Oral Oral   SpO2: 97% 100% 99%   Weight:    58 kg (127 lb 14.4 oz)  Height:        Intake/Output Summary (Last 24 hours) at 03/12/2018 1052 Last data filed at 03/12/2018 0600 Gross per 24 hour  Intake 240 ml  Output 2540 ml  Net -2300 ml   Filed Weights   03/09/18 0525 03/11/18 0247 03/12/18 0430  Weight: 61.3 kg (135 lb 3.2 oz) 58.5 kg (129 lb) 58 kg (127 lb 14.4 oz)    Examination:  General exam: Appears calm and comfortable  Respiratory system: Crackles  to auscultation. Respiratory effort normal. Cardiovascular system: S1 & S2 heard, ireg irreg. No JVD, murmurs, rubs, gallops or clicks. No pedal edema. Gastrointestinal system: Abdomen is nondistended, soft and nontender. No organomegaly or masses felt. Normal bowel sounds heard. Central nervous system: Alert and oriented. No focal neurological deficits. Extremities: Symmetric 5 x 5 power. Skin:  Rash noted on the back and both legs. Psychiatry: Judgement and insight appear normal. Mood & affect appropriate.     Data Reviewed: I have personally reviewed following labs and imaging studies  CBC: Recent Labs  Lab 03/06/18 1128  WBC 2.6*  HGB 9.7*  HCT 31.9*  MCV 75.8*  PLT 937   Basic  Metabolic Panel: Recent Labs  Lab 03/07/18 0433 03/08/18 0430 03/09/18 0644 03/10/18 0702 03/11/18 0439  NA 134* 131* 135 133* 134*  K 3.9 3.9 3.7 4.2 3.9  CL 101 95* 98* 93* 96*  CO2 26 28 31 31  33*  GLUCOSE 89 125* 101* 111* 103*  BUN 15 13 17 19  22*  CREATININE 0.87 0.88 0.88 1.01* 0.96  CALCIUM 8.5* 8.6* 8.3* 8.7* 8.2*   GFR: Estimated Creatinine Clearance: 38.9 mL/min (by C-G formula based on SCr of 0.96 mg/dL). Liver Function Tests: No results for input(s): AST, ALT, ALKPHOS, BILITOT, PROT, ALBUMIN in the last 168 hours. No results for input(s): LIPASE, AMYLASE in the last 168 hours. No results for input(s): AMMONIA in the last 168 hours. Coagulation Profile: Recent Labs  Lab 03/08/18 0430 03/09/18 0644 03/10/18 0702 03/11/18 0439 03/12/18 0539  INR 3.88 3.10 2.21 1.87 1.92   Cardiac Enzymes: Recent Labs  Lab 03/06/18 1803 03/06/18 2328  TROPONINI <0.03 <0.03   BNP (last 3 results) No results for input(s): PROBNP in the last 8760 hours. HbA1C: No results for input(s): HGBA1C in the last 72 hours. CBG: Recent Labs  Lab 03/11/18 1119 03/11/18 1725 03/11/18 1734 03/11/18 2036 03/12/18 0808  GLUCAP 128* 132* 148* 121* 121*   Lipid Profile: No results for input(s): CHOL, HDL, LDLCALC, TRIG, CHOLHDL,  LDLDIRECT in the last 72 hours. Thyroid Function Tests: No results for input(s): TSH, T4TOTAL, FREET4, T3FREE, THYROIDAB in the last 72 hours. Anemia Panel: No results for input(s): VITAMINB12, FOLATE, FERRITIN, TIBC, IRON, RETICCTPCT in the last 72 hours. Sepsis Labs: No results for input(s): PROCALCITON, LATICACIDVEN in the last 168 hours.  No results found for this or any previous visit (from the past 240 hour(s)).       Radiology Studies: No results found.      Scheduled Meds: . ALPRAZolam  0.25-0.5 mg Oral See admin instructions  . aspirin EC  81 mg Oral Q M,W,F  . atorvastatin  80 mg Oral Q supper  . cholecalciferol  1,000 Units Oral  Daily  . cholestyramine  4 g Oral TID  . enoxaparin (LOVENOX) injection  60 mg Subcutaneous BID  . feeding supplement (GLUCERNA SHAKE)  237 mL Oral BID BM  . feeding supplement (PRO-STAT SUGAR FREE 64)  30 mL Oral BID  . furosemide  80 mg Intravenous Q12H  . insulin aspart  0-5 Units Subcutaneous QHS  . insulin aspart  0-9 Units Subcutaneous TID WC  . irbesartan  300 mg Oral Daily  . magnesium oxide  400 mg Oral Daily  . metoprolol tartrate  50 mg Oral BID  . miconazole   Topical BID  . multivitamin with minerals  1 tablet Oral Daily  . pantoprazole  40 mg Oral Daily  . potassium chloride  20 mEq Oral BID  . saccharomyces boulardii  250 mg Oral BID  . sodium chloride flush  3 mL Intravenous Q12H  . warfarin  5 mg Oral ONCE-1800  . Warfarin - Pharmacist Dosing Inpatient   Does not apply q1800   Continuous Infusions: . sodium chloride       LOS: 6 days     [name], MD [facility]  If 7PM-7AM, please contact night-coverage [email or web address] Password [email or web address] 03/12/2018, 10:52 AM

## 2018-03-12 NOTE — Progress Notes (Signed)
ANTICOAGULATION CONSULT NOTE - Follow Up Consult  Pharmacy Consult for Coumadin Indication: atrial fibrillation and mechanical AVR  Labs: Recent Labs    03/10/18 0702 03/11/18 0439 03/12/18 0539  LABPROT 24.4* 21.4* 21.8*  INR 2.21 1.87 1.92  CREATININE 1.01* 0.96  --    Assessment: 77 yo F on Coumadin PTA for mechanical AVR and afib.  Coumadin was held last week for cholecystectomy.  Pt was discharged 5/28 s/p cholecystectomy. Returned to ED with SOB.  Noted to have elevated INR on presentation.  Suspect elevated INR is a combination of higher doses given during last admission and decreased PO intake since procedure.  INR trended down after 3 held doses 5/28-5/30. INR today subtherapeutic: 1.92 . Still awaiting clarifiaction over INR goal discrepancy (see previous note). Either way INR is < 2.0 so bridge therapy will be continued. No overt bleeding documented.   PTA Dosing: 7.5mg  MWF, 5mg  TTSS, admit INR was 4.5  Goal of Therapy:  INR 2.5-3.5 Monitor platelets by anticoagulation protocol: Yes   Plan:  Coumadin 5mg  PO x 1 tonight Lovenox 60mg  sub-Q BID (while INR subtherapeutic) Continue daily INR  Georga Bora, PharmD Clinical Pharmacist 03/12/2018 7:43 AM

## 2018-03-12 NOTE — Progress Notes (Signed)
Progress Note  Patient Name: [name] Date of Encounter: 03/12/2018  Primary Cardiologist: Quay Burow, MD   Subjective   Feels better, appetite is better, no chest pain, is able to lie flat,  Has some back pain.  She complains of a rash.  + pruritis.   Also worried she is not on hydralazine.  Inpatient Medications    Scheduled Meds: . ALPRAZolam  0.25-0.5 mg Oral See admin instructions  . aspirin EC  81 mg Oral Q M,W,F  . atorvastatin  80 mg Oral Q supper  . cholecalciferol  1,000 Units Oral Daily  . cholestyramine  4 g Oral TID  . enoxaparin (LOVENOX) injection  60 mg Subcutaneous BID  . feeding supplement (GLUCERNA SHAKE)  237 mL Oral BID BM  . feeding supplement (PRO-STAT SUGAR FREE 64)  30 mL Oral BID  . furosemide  80 mg Intravenous Q12H  . insulin aspart  0-5 Units Subcutaneous QHS  . insulin aspart  0-9 Units Subcutaneous TID WC  . irbesartan  300 mg Oral Daily  . magnesium oxide  400 mg Oral Daily  . metoprolol tartrate  50 mg Oral BID  . miconazole   Topical BID  . multivitamin with minerals  1 tablet Oral Daily  . pantoprazole  40 mg Oral Daily  . potassium chloride  20 mEq Oral BID  . saccharomyces boulardii  250 mg Oral BID  . sodium chloride flush  3 mL Intravenous Q12H  . warfarin  5 mg Oral ONCE-1800  . Warfarin - Pharmacist Dosing Inpatient   Does not apply q1800   Continuous Infusions: . sodium chloride     PRN Meds: sodium chloride, acetaminophen, guaiFENesin, ondansetron (ZOFRAN) IV, polyvinyl alcohol, sodium chloride flush, traMADol   Vital Signs    Vitals:   03/11/18 1224 03/11/18 2007 03/12/18 0421 03/12/18 0430  BP: (!) 147/47 (!) 169/52 (!) 151/61   Pulse: 67 76 60   Resp: 20 20 12    Temp: 98.2 F (36.8 C) 98.1 F (36.7 C) 98.6 F (37 C)   TempSrc: Oral Oral Oral   SpO2: 97% 100% 99%   Weight:    127 lb 14.4 oz (58 kg)  Height:        Intake/Output Summary (Last 24 hours) at 03/12/2018 0846 Last data filed at 03/12/2018  0600 Gross per 24 hour  Intake 480 ml  Output 2540 ml  Net -2060 ml   Filed Weights   03/09/18 0525 03/11/18 0247 03/12/18 0430  Weight: 135 lb 3.2 oz (61.3 kg) 129 lb (58.5 kg) 127 lb 14.4 oz (58 kg)    Telemetry    A fib rate controlled  - Personally Reviewed  ECG    No new - Personally Reviewed  Physical Exam   GEN: No acute distress.   Neck: No JVD Cardiac: irreg irreg, no murmurs + click of mechanical valve, no rubs, or gallops.  Respiratory: rales in bases to auscultation bilaterally. No wheezes or rhonchi GI: Soft, nontender, non-distended  MS: + mild edema in feet; No deformity. Neuro:  Nonfocal  Psych: Normal affect  Skin: petechial rash on legs and thighs, + pruritis. Pale rash on upper chest.  Bruises on abd with lovenox injections.    Labs    Chemistry Recent Labs  Lab 03/09/18 0644 03/10/18 0702 03/11/18 0439  NA 135 133* 134*  K 3.7 4.2 3.9  CL 98* 93* 96*  CO2 31 31 33*  GLUCOSE 101* 111* 103*  BUN 17  19 22*  CREATININE 0.88 1.01* 0.96  CALCIUM 8.3* 8.7* 8.2*  GFRNONAA >60 52* 55*  GFRAA >60 60* >60  ANIONGAP 6 9 5      Hematology Recent Labs  Lab 03/06/18 1128  WBC 2.6*  RBC 4.21  HGB 9.7*  HCT 31.9*  MCV 75.8*  MCH 23.0*  MCHC 30.4  RDW 19.5*  PLT 199    Cardiac Enzymes Recent Labs  Lab 03/06/18 1803 03/06/18 2328  TROPONINI <0.03 <0.03    Recent Labs  Lab 03/06/18 1137  TROPIPOC 0.01     BNP Recent Labs  Lab 03/06/18 1352  BNP 790.8*     DDimer No results for input(s): DDIMER in the last 168 hours.   Radiology    No results found.  Cardiac Studies   Echo 02/24/2018 Study Conclusions  - Left ventricle: The cavity size was normal. Wall thickness was normal. Systolic function was normal. The estimated ejection fraction was in the range of 55% to 60%. Wall motion was normal; there were no regional wall motion abnormalities. Doppler parameters are consistent with a reversible restrictive  pattern, indicative of decreased left ventricular diastolic compliance and/or increased left atrial pressure (grade 3 diastolic dysfunction). - Ventricular septum: The contour showed diastolic flattening. - Aortic valve: A mechanical prosthesis was present and functioning normally. Peak velocity (S): 238 cm/s. Mean gradient (S): 10 mm Hg. Valve area (VTI): 2.24 cm^2. Valve area (Vmax): 2.26 cm^2. Valve area (Vmean): 1.88 cm^2. - Mitral valve: Calcified annulus. There was mild regurgitation. Valve area by continuity equation (using LVOT flow): 2.53 cm^2. - Left atrium: The atrium was moderately to severely dilated. - Right ventricle: The cavity size was moderately dilated. Wall thickness was normal. Systolic function was moderately to severely reduced. - Right atrium: The atrium was severely dilated. - Tricuspid valve: There was severe regurgitation. - Pulmonary arteries: Systolic pressure was severely increased. PA peak pressure: 88 mm Hg (S).  Impressions:  - When compared to prior, PA pressures have increased.     Patient Profile     77 y.o. female with a hx of CAD, CABG in 07/2004 X2 and AVR with [employer]. Jude valve -on coumadin and PVOD, permanent a fib, HTN, HLD, diabetes and recent admission 5/16-5/28 for cholelithiasis and subacute cholecystitis s/p cholecystectomy. Readmitted day after discharge for acute hypoxic respiratory failure, secondary to acute CHF/ volume overload   Assessment & Plan    Acute on chronic diastolic HF-- on echo E3MO --neg 9751 since admit with wt down from 145 to 127 lbs.   --yesterday neg 2060 ml. --on lasix 80 every 12 hours.   CAD with CABG 2005, no angina and no acute EKG changes  Permanent atrial fib since 2017, found on office visit --Rate controlled on metoprolol --on coumadin for anticoagulation per pharmacy --INR 1.92 today   St Jude mechanical aortic valve-- echo with normally functioning mechanical  prosthesis. --anticoagulation on coumadin INR 1.92 per pharmacy  On lovenox crossover  With goal for valve 2.5 to 3.5  Dr. Oval Linsey to clarify   Anemia with last hgb of 9.7 had symptomatic anemia 01/2018 with GI eval and suspected anemia secondary to mucosal bleeding from anticoagulation.    Pulmonary hypertension with PA pk pressure 88 mmHg   HTN on avapro and metoprolol in past she has been on hydralazine but stopped on last admit due to hypotension or borderline with surgery.   --BP today 151/61 --may need to resume hydralazine at 50 mg BID?    PAD  no claudication.   RASH on legs and upper chest  Questran and florastor new per IM     For questions or updates, please contact [facility] Please consult [email or web address] for contact info under Cardiology/STEMI.      Signed, Cecilie Kicks, NP  03/12/2018, 8:46 AM

## 2018-03-13 ENCOUNTER — Other Ambulatory Visit: Payer: Self-pay | Admitting: Cardiology

## 2018-03-13 DIAGNOSIS — I38 Endocarditis, valve unspecified: Secondary | ICD-10-CM

## 2018-03-13 DIAGNOSIS — I5033 Acute on chronic diastolic (congestive) heart failure: Secondary | ICD-10-CM

## 2018-03-13 LAB — BASIC METABOLIC PANEL
ANION GAP: 8 (ref 5–15)
BUN: 30 mg/dL — ABNORMAL HIGH (ref 6–20)
CALCIUM: 8.6 mg/dL — AB (ref 8.9–10.3)
CO2: 29 mmol/L (ref 22–32)
Chloride: 97 mmol/L — ABNORMAL LOW (ref 101–111)
Creatinine, Ser: 0.96 mg/dL (ref 0.44–1.00)
GFR calc Af Amer: >60 mL/min (ref >60)
GFR, EST NON AFRICAN AMERICAN: 55 mL/min — AB (ref >60)
GLUCOSE: 102 mg/dL — AB (ref 65–99)
Potassium: 4 mmol/L (ref 3.5–5.1)
Sodium: 134 mmol/L — ABNORMAL LOW (ref 135–145)

## 2018-03-13 LAB — GLUCOSE, CAPILLARY
GLUCOSE-CAPILLARY: 157 mg/dL — AB (ref 65–99)
Glucose-Capillary: 127 mg/dL — ABNORMAL HIGH (ref 65–99)
Glucose-Capillary: 109 mg/dL — ABNORMAL HIGH (ref 65–99)

## 2018-03-13 LAB — CBC
HCT: 30.7 % — ABNORMAL LOW (ref 36.0–46.0)
Hemoglobin: 9.1 g/dL — ABNORMAL LOW (ref 12.0–15.0)
MCH: 22.8 pg — ABNORMAL LOW (ref 26.0–34.0)
MCHC: 29.6 g/dL — ABNORMAL LOW (ref 30.0–36.0)
MCV: 76.8 fL — AB (ref 78.0–100.0)
PLATELETS: 269 10*3/uL (ref 150–400)
RBC: 4 MIL/uL (ref 3.87–5.11)
RDW: 20.1 % — AB (ref 11.5–15.5)
WBC: 5.6 10*3/uL (ref 4.0–10.5)

## 2018-03-13 LAB — PROTIME-INR
INR: 2.14
PROTHROMBIN TIME: 23.7 s — AB (ref 11.4–15.2)

## 2018-03-13 MED ORDER — PRO-STAT SUGAR FREE PO LIQD: 30.0000 mL | Freq: Two times a day (BID) | ORAL | 0 refills | Status: AC

## 2018-03-13 MED ORDER — WARFARIN SODIUM 5 MG PO TABS: 5.0000 mg | ORAL_TABLET | Freq: Once | ORAL | Status: DC

## 2018-03-13 MED ORDER — WARFARIN SODIUM 5 MG PO TABS: 5.0000 mg | ORAL_TABLET | Freq: Every day | ORAL | 0 refills | Status: DC

## 2018-03-13 MED ORDER — FUROSEMIDE 20 MG PO TABS: 80.0000 mg | ORAL_TABLET | Freq: Every day | ORAL | 0 refills | Status: DC

## 2018-03-13 NOTE — Progress Notes (Signed)
Patient complaining of right ear pain, says it hurts her sometimes (on and off). Also can't really hear out of that ear, per patient.

## 2018-03-13 NOTE — Progress Notes (Signed)
SATURATION QUALIFICATIONS: (This note is used to comply with regulatory documentation for home oxygen)  Patient Saturations on Room Air at Rest =95 %  Patient Saturations on Room Air while Ambulating = 92%  Patient Saturations on Liters of oxygen while Ambulating = %  Please briefly explain why patient needs home oxygen: n/a

## 2018-03-13 NOTE — Plan of Care (Signed)
  Problem: Education: Goal: Knowledge of General Education information will improve Outcome: Progressing   Problem: Health Behavior/Discharge Planning: Goal: Ability to manage health-related needs will improve Outcome: Progressing   Problem: Clinical Measurements: Goal: Ability to maintain clinical measurements within normal limits will improve Outcome: Progressing   Problem: Clinical Measurements: Goal: Respiratory complications will improve Outcome: Progressing

## 2018-03-13 NOTE — Progress Notes (Signed)
Progress Note  Patient Name: [name] Date of Encounter: 03/13/2018  Primary Cardiologist: Quay Burow, MD   Subjective   Sitting up in chair, feels well. Still on 3L [place]. Reports prior to last hospitalization for cholecystitis she did not require oxygen. Was discharged on temporary nasal cannula. Patient reports ambulating the halls yesterday without oxygen and able to maintain saturations > 88%.   Inpatient Medications    Scheduled Meds: . ALPRAZolam  0.25-0.5 mg Oral See admin instructions  . aspirin EC  81 mg Oral Q M,W,F  . atorvastatin  80 mg Oral Q supper  . cholecalciferol  1,000 Units Oral Daily  . feeding supplement (GLUCERNA SHAKE)  237 mL Oral BID BM  . feeding supplement (PRO-STAT SUGAR FREE 64)  30 mL Oral BID  . furosemide  80 mg Intravenous Q12H  . hydrocortisone ointment  1 application Topical BID  . insulin aspart  0-5 Units Subcutaneous QHS  . insulin aspart  0-9 Units Subcutaneous TID WC  . irbesartan  300 mg Oral Daily  . magnesium oxide  400 mg Oral Daily  . metoprolol tartrate  50 mg Oral BID  . miconazole   Topical BID  . multivitamin with minerals  1 tablet Oral Daily  . pantoprazole  40 mg Oral Daily  . potassium chloride  20 mEq Oral BID  . sodium chloride flush  3 mL Intravenous Q12H  . warfarin  5 mg Oral ONCE-1800  . Warfarin - Pharmacist Dosing Inpatient   Does not apply q1800   Continuous Infusions: . sodium chloride     PRN Meds: sodium chloride, acetaminophen, guaiFENesin, ondansetron (ZOFRAN) IV, polyvinyl alcohol, sodium chloride flush, traMADol   Vital Signs    Vitals:   03/12/18 1500 03/12/18 2002 03/12/18 2305 03/13/18 0435  BP: (!) 163/49 (!) 163/65 (!) 154/54 (!) 151/64  Pulse: 79 85 87 77  Resp: 20 18  20   Temp: 98.4 F (36.9 C) 98.5 F (36.9 C)  98.8 F (37.1 C)  TempSrc: Oral Oral  Oral  SpO2: 99% 98%  98%  Weight:    126 lb (57.2 kg)  Height:        Intake/Output Summary (Last 24 hours) at 03/13/2018  1116 Last data filed at 03/13/2018 1540 Gross per 24 hour  Intake 720 ml  Output 2350 ml  Net -1630 ml   Filed Weights   03/11/18 0247 03/12/18 0430 03/13/18 0435  Weight: 129 lb (58.5 kg) 127 lb 14.4 oz (58 kg) 126 lb (57.2 kg)    Telemetry    Atrial fibrillation - Personally Reviewed  ECG    Atrial fibrillation, T wave inversions in later leads - Personally Reviewed  Physical Exam   GEN: No acute distress.   Neck: No JVD Cardiac: Irregularly irregular, no murmurs, mechanical valve click, no rubs or gallops Respiratory: Clear to auscultation bilaterally. GI: Soft, nontender, non-distended  MS: Trace edema; No deformity. Neuro:  Nonfocal  Psych: Normal affect   Labs    Chemistry Recent Labs  Lab 03/10/18 0702 03/11/18 0439 03/13/18 0538  NA 133* 134* 134*  K 4.2 3.9 4.0  CL 93* 96* 97*  CO2 31 33* 29  GLUCOSE 111* 103* 102*  BUN 19 22* 30*  CREATININE 1.01* 0.96 0.96  CALCIUM 8.7* 8.2* 8.6*  GFRNONAA 52* 55* 55*  GFRAA 60* >60 >60  ANIONGAP 9 5 8      Hematology Recent Labs  Lab 03/06/18 1128 03/13/18 0538  WBC 2.6* 5.6  RBC 4.21 4.00  HGB 9.7* 9.1*  HCT 31.9* 30.7*  MCV 75.8* 76.8*  MCH 23.0* 22.8*  MCHC 30.4 29.6*  RDW 19.5* 20.1*  PLT 199 269    Cardiac Enzymes Recent Labs  Lab 03/06/18 1803 03/06/18 2328  TROPONINI <0.03 <0.03    Recent Labs  Lab 03/06/18 1137  TROPIPOC 0.01     BNP Recent Labs  Lab 03/06/18 1352  BNP 790.8*     DDimer No results for input(s): DDIMER in the last 168 hours.   Radiology    No results found.  Cardiac Studies   NA  Patient Profile     77 y.o. female with pmhx of CAD s/p CABG, s/p mechanical AVR on warfarin, permanent atrial fibrillation, HTN, HLD, and DM here with acute on chronic diastolic heart failure.   Assessment & Plan    Acute on Chronic Diastolic Heart Failure: Echocardiogram with G3DD. Diuresing with IV lasix 80 mg BID. Weight down 1 lb since yesterday (127 -> 126). I/Os  with net -2L since yesterday, -11L since admission.  -- S/p IV lasix this AM -- Ok to discharge, increase home lasix 60 -> 80 mg daily to resume tomorrow  -- Wean oxygen as able, ambulatory pulse ox  Permanent Atrial Fibrillation: -- Rate controlled on metoprolol -- Continue coumadin -- INR 2.14 today   St. Jude Mechanical Aortic Valve: On lovenox bridge for subtherapeutic INR per pharmacy  -- INR at goal today   HTN: On   For questions or updates, please contact [place] Please consult [email or web address] for contact info under Cardiology/STEMI.      Signed, Velna Ochs, MD  03/13/2018, 11:16 AM

## 2018-03-13 NOTE — Discharge Summary (Signed)
Physician Discharge Summary  [name] FUX:[id number] DOB: April 24, 1941 DOA: 03/06/2018  PCP: [name], MD  Admit date: 03/06/2018 Discharge date: 03/13/2018  Time spent:40 minutes  Recommendations for Outpatient Follow-up:  1. Follow up outpatient CBC/CMP 2. Follow up INR (asked home health RN to check INR on 5/7 and again prior to outpatient cardiology appointment - goal INR 2-3 for Mrs Prescher per discussion with cardiology - with combination of afib and mechanical AV, typically goal 2.5-3.5, but with hx of GI bleed, aiming for 2-3).  Warfarin dose changed to 5 mg daily.  Follow up with cardiology for continued dosing. 3. Lasix increased to 80 mg daily.  Continue to follow kidney function and volume status 4. Of note, ASA which she takes MWF was inadvertently discontinued on discharge.  It looks like the indication for this is her CAD.  I called her and discussed that she should continue to take this as prescribed and follow up with cardiology (she expressed understanding).  Unfortunately, since she's been discharged, I can't add this back to med list, but this should be added back to medlist when she follows up with primary cardiologist or PCP.    Discharge Diagnoses:  Principal Problem:   Acute on chronic diastolic heart failure (HCC) Active Problems:   Diabetes mellitus type 2, controlled (HCC)   Chronic atrial fibrillation (HCC)   S/P cholecystectomy   HTN (hypertension)   H/O mechanical aortic valve replacement   Acute hyponatremia   Acute respiratory failure with hypoxia (HCC)   Moderate to severe pulmonary hypertension (HCC)   Malnutrition of moderate degree   Discharge Condition: stable  Diet recommendation: heart healthy  Filed Weights   03/11/18 0247 03/12/18 0430 03/13/18 0435  Weight: 58.5 kg (129 lb) 58 kg (127 lb 14.4 oz) 57.2 kg (126 lb)    History of present illness:  77 y.o.femalewith medical history significant forhypertension, chronic grade 3  diastolic heart failure, deficiency anemia, chronic atrial fibrillation chronic status post AVR on warfarin, diabetes on oral agents. Patient was discharged on 5/28 after protracted hospitalization in the context of calculus cholecystitis status post cholecystectomy. Surgery was delayed in the context of supratherapeutic INR and need for vitamin K reversal. Anticoagulation was resumed on 5/24 on the afternoon of surgery. Patient was noted with lower extremity edema and chest x-ray positive for heart failure 5/27 and was given an IV Lasix dose of 20 mg x 1. Patient was not on oxygen priorto admission but was discharged home on 2 L oxygen. Patient reports over the past 24 hours she has noted increased shortness of breath, productive cough of clear to pinkish tinged sputum. And worsening lower extremity edema. Of note weight at time of admission on 5/17 was 139 lbs. Today's weight is 149 lbs. Chest x-ray in the ER consistent with CHF and BNP elevated at 791. Patient has been given Reene Harlacher dose of Lasix 60 mg IV x1 in the ER noting home dose of 60 mg daily.  She was diuresed with IV lasix and has improved.  Previous echo was notable for EF 55-60% and grade 3 diastolic dysfunction and this was not repeated during this admission.  Cardiology was consulted and assisted with her management.  She was able to be weaned from her supplemental O2 on the day of discharge.  Her warfarin dose was adjusted and her INR goal was adjusted in the setting of her hx of GI bleed.    Hospital Course:  1]acute on chronic hypoxic respiratory failure  secondary to CHF diastolic exacerbation  - improved with IV lasix - discharged with increased PO lasix dose to 80 mg daily - continue irbesartan, metoprolol - Discharge weight 57.2 kg - net negative 12 L since admit - Her oxygen was able to be discontinued on the day of discharge  Wt Readings from Last 3 Encounters:  03/13/18 57.2 kg (126 lb)  03/05/18 66.8 kg (147 lb 4.8 oz)   01/22/18 68 kg (150 lb)   2]status post recent cholecystectomy 5/24 /19  3]chronic atrial fibrillation /mechanical aortic valve replacement. Continue beta-blocker. Patient is on Coumadin with initially supratherapeutic INR pharmacy following.Recently therapeutic.  Discussed INR goal with cardiology.  With afib and mechanical AV replacement, typically recommend INR goal of 2.5-3.5, but pt with hx of GI bleed in April (at the time, anemia suspected 2/2 mucosal bleeding from anticoagulation), will have INR goal of 2-3. Warfarin dosing decreased to 5 mg daily (discussed with pharmacy).  Will have home health checks by RN over next few days as well.   4]hypertension continue Avapro and metoprolol as well as lasix  5]type 2 diabetes resume home meds  6]hyperlipidemia continue Lipitor.  7]moderate malnutrition secondary to chronic illness. Followed by nutritionist.Evidenced by fat depletion and edema. Continue with multivitamin and minerals daily. Glucerna and Prostat  8] new rash only new medications that was started with Questran and Florastor which were discontinued.  Continue to monitor.    CAD s/p CABG: continue ASA (as noted above in follow up recommendations, needs to be readded to med list), irbesartan, metoprolol  Procedures:  none   Consultations:  cardiology  Discharge Exam: Vitals:   03/13/18 0435 03/13/18 1155  BP: (!) 151/64 120/60  Pulse: 77 76  Resp: 20 18  Temp: 98.8 F (37.1 C) 97.8 F (36.6 C)  SpO2: 98% 97%   SOB improved.   Ready to go home  General: No acute distress. Cardiovascular: Heart sounds show Shahab Polhamus regular rate, and rhythm. No gallops or rubs. No murmurs. No JVD. Lungs: Clear to auscultation bilaterally with good air movement. No rales, rhonchi or wheezes. Abdomen: Soft, nontender, nondistended with normal active bowel sounds. No masses. No hepatosplenomegaly. Neurological: Alert and oriented 3. Moves all extremities 4 with  equal strength. Cranial nerves II through XII grossly intact. Skin: Warm and dry. No rashes or lesions. Extremities: No clubbing or cyanosis. No edema. Pedal pulses 2+. Psychiatric: Mood and affect are normal. Insight and judgment are appropriate.  Discharge Instructions   Discharge Instructions    (HEART FAILURE PATIENTS) Call MD:  Anytime you have any of the following symptoms: 1) 3 pound weight gain in 24 hours or 5 pounds in 1 week 2) shortness of breath, with or without Kindall Swaby dry hacking cough 3) swelling in the hands, feet or stomach 4) if you have to sleep on extra pillows at night in order to breathe.   Complete by:  As directed    Avoid straining   Complete by:  As directed    Call MD for:  difficulty breathing, headache or visual disturbances   Complete by:  As directed    Call MD for:  extreme fatigue   Complete by:  As directed    Call MD for:  hives   Complete by:  As directed    Call MD for:  persistant dizziness or light-headedness   Complete by:  As directed    Call MD for:  persistant nausea and vomiting   Complete by:  As directed  Call MD for:  redness, tenderness, or signs of infection (pain, swelling, redness, odor or green/yellow discharge around incision site)   Complete by:  As directed    Call MD for:  severe uncontrolled pain   Complete by:  As directed    Call MD for:  temperature >100.4   Complete by:  As directed    Diet - low sodium heart healthy   Complete by:  As directed    Diet - low sodium heart healthy   Complete by:  As directed    Diet - low sodium heart healthy   Complete by:  As directed    Discharge instructions   Complete by:  As directed    You were seen for heart failure.  We diuresed you with IV medications.  You were seen by cardiology.  Please continue your lasix as prescribed at discharge.  Check your weight daily.  Call if your weight increases by more than 3 lbs in 1 day or more than 5 lbs in 1 week.  We've increased your lasix to  80 mg daily.    For your atrial fibrillation and mechanical aortic valve, we've decreased your warfarin to 5 mg daily.  Please follow up with cardiology as scheduled (they should call you to schedule an appointment).  We were able to wean you off of your oxygen here in the hospital.    Return for new, recurrent, or worsening symptoms.  Please ask your PCP to request records from this hospitalization so they know what was done and what the next steps will be.   Heart Failure patients record your daily weight using the same scale at the same time of day   Complete by:  As directed    Increase activity slowly   Complete by:  As directed    Increase activity slowly   Complete by:  As directed    Increase activity slowly   Complete by:  As directed    STOP any activity that causes chest pain, shortness of breath, dizziness, sweating, or exessive weakness   Complete by:  As directed      Allergies as of 03/13/2018      Reactions   Flagyl [metronidazole] Rash   ALL-OVER BODY RASH   Coreg [carvedilol] Other (See Comments)   Terrible cramping in the feet and had Daltin Crist lot of bowel movements, but not diarrhea   Losartan Swelling   Patient doesn't recall site of swelling   Spironolactone Nausea Only   Caused extreme nausea   Verapamil Hives   Zetia [ezetimibe] Other (See Comments)   Reaction not recalled   Zocor [simvastatin - High Dose] Other (See Comments)   Reaction not recalled      Medication List    STOP taking these medications   aspirin EC 81 MG tablet     TAKE these medications   acetaminophen 325 MG tablet Commonly known as:  TYLENOL Take 2 tablets (650 mg total) by mouth every 6 (six) hours as needed for mild pain, fever or headache (for pain or headaches).   ALPRAZolam 0.5 MG tablet Commonly known as:  XANAX Take 0.25-0.5 mg by mouth See admin instructions. Take 0.25-0.5 mg two times Hardy Harcum day as needed for anxiety and 0.25-0.5 mg at bedtime scheduled   atorvastatin 80 MG  tablet Commonly known as:  LIPITOR Take 1 tablet (80 mg total) by mouth daily. What changed:  when to take this   feeding supplement (PRO-STAT SUGAR FREE 64) Liqd  Take 30 mLs by mouth 2 (two) times daily.   ferrous sulfate 325 (65 FE) MG tablet Take 1 tablet (325 mg total) by mouth 2 (two) times daily with Peony Barner meal.   furosemide 20 MG tablet Commonly known as:  LASIX Take 4 tablets (80 mg total) by mouth daily. What changed:  how much to take   glipiZIDE 2.5 MG 24 hr tablet Commonly known as:  GLUCOTROL XL Take 2.5 mg by mouth daily.   hydrocortisone cream 1 % Apply topically 2 (two) times daily.   irbesartan 300 MG tablet Commonly known as:  AVAPRO Take 1 tablet (300 mg total) by mouth daily.   magnesium oxide 400 (241.3 Mg) MG tablet Commonly known as:  MAG-OX Take 1 tablet (400 mg total) by mouth daily.   metFORMIN 500 MG tablet Commonly known as:  GLUCOPHAGE Take 500 mg by mouth 2 (two) times daily with Filimon Miranda meal.   metoprolol tartrate 50 MG tablet Commonly known as:  LOPRESSOR Take 50 mg by mouth 2 (two) times daily.   ondansetron 4 MG disintegrating tablet Commonly known as:  ZOFRAN ODT 4mg  ODT q6-8 hours prn nausea/vomit   pantoprazole 40 MG tablet Commonly known as:  PROTONIX Take 40 mg by mouth daily.   potassium chloride SA 20 MEQ tablet Commonly known as:  K-DUR,KLOR-CON Take 1 tablet (20 mEq total) by mouth daily.   saccharomyces boulardii 250 MG capsule Commonly known as:  FLORASTOR Take 1 capsule (250 mg total) by mouth 2 (two) times daily.   SYSTANE PRESERVATIVE FREE 0.4-0.3 % Soln Generic drug:  Polyethyl Glyc-Propyl Glyc PF Place 1-2 drops into both eyes 3 (three) times daily as needed (for irritation).   traMADol 50 MG tablet Commonly known as:  ULTRAM Take 1 tablet (50 mg total) by mouth every 6 (six) hours as needed for moderate pain.   vitamin B-12 100 MCG tablet Commonly known as:  CYANOCOBALAMIN Take 100 mcg by mouth daily.    Vitamin D3 1000 units Caps Take 1,000 Units by mouth daily.   warfarin 5 MG tablet Commonly known as:  COUMADIN Take as directed. If you are unsure how to take this medication, talk to your nurse or doctor. Original instructions:  Take 1 tablet (5 mg total) by mouth daily at 6 PM. What changed:  See the new instructions.      Allergies  Allergen Reactions  . Flagyl [Metronidazole] Rash    ALL-OVER BODY RASH  . Coreg [Carvedilol] Other (See Comments)    Terrible cramping in the feet and had Alfa Leibensperger lot of bowel movements, but not diarrhea  . Losartan Swelling    Patient doesn't recall site of swelling  . Spironolactone Nausea Only    Caused extreme nausea  . Verapamil Hives  . Zetia [Ezetimibe] Other (See Comments)    Reaction not recalled  . Zocor [Simvastatin - High Dose] Other (See Comments)    Reaction not recalled   [place] Follow up.   Specialty:  Home Health Services Why:  local number # [phone]  Home Health RN and Physical Therapy- agency will call to arrange initial visit Contact information: [place] [address] [phone]        [name], [name], MD Follow up.   Specialty:  Internal Medicine Why:  call for follow up appointment Contact information: [address] [place] [address] [place],  Pearletha Forge, MD Follow up.   Specialties:  Cardiology, Radiology Why:  You have [facility] follow up on June 11th at 9:20 with Dr. Kennon Holter PA, [name].  You will also need to have labs drawn on that day. You do not need to be fasting.  Contact information: [address] [place] [address] [phone]            The results of significant diagnostics from this hospitalization (including imaging, microbiology, ancillary and laboratory) are listed below for reference.    Significant Diagnostic Studies: Dg Chest 2  View  Result Date: 03/06/2018 CLINICAL DATA:  Chest pain since yesterday. Swelling and weight gain. History of congestive heart failure. EXAM: CHEST - 2 VIEW COMPARISON:  03/04/2018 and multiple previous FINDINGS: Bilateral effusions continue to enlarge, associated with more basilar atelectasis. There is chronic cardiomegaly. The patient has had previous median sternotomy and CABG. There is aortic atherosclerosis. There is venous hypertension with interstitial edema. Calcified granulomas as seen previously. No acute bone finding. IMPRESSION: Congestive heart failure. Enlarging effusions with basilar atelectasis. Venous hypertension and interstitial edema. Electronically Signed   By: Nelson Chimes M.D.   On: 03/06/2018 11:53   Dg Chest 2 View  Result Date: 03/04/2018 CLINICAL DATA:  Chest pain and SOB for Reeve Mallo few days EXAM: CHEST - 2 VIEW COMPARISON:  02/28/2018 FINDINGS: Stable changes from prior cardiac surgery. Cardiac silhouette is mildly enlarged. No mediastinal or hilar masses. No evidence of adenopathy. Small left pleural effusion. There is bilateral interstitial thickening and vascular prominence, which is similar to the prior exam. Stable granuloma are noted in the right mid lung and left lung base. Streaky opacity at the left lung base is consistent with atelectasis, stable from the most recent prior study. Skeletal structures are demineralized but grossly intact. IMPRESSION: 1. Findings suggest mild acute on chronic congestive heart failure similar to the prior exam, with vascular congestion and interstitial thickening as well as Nael Petrosyan small left pleural effusion and cardiomegaly. Electronically Signed   By: Lajean Manes M.D.   On: 03/04/2018 11:43   Nm Hepatobiliary Including Gb  Result Date: 02/15/2018 CLINICAL DATA:  Right upper quadrant pain. Non intractable vomiting with nausea. EXAM: NUCLEAR MEDICINE HEPATOBILIARY IMAGING TECHNIQUE: Sequential images of the abdomen were obtained out to 60 minutes  following intravenous administration of radiopharmaceutical. RADIOPHARMACEUTICALS:  6.9 mCi Tc-7m Choletec IV (this includes Michial Disney booster dose) COMPARISON:  Abdominal ultrasound 01/17/2018 FINDINGS: Prompt uptake and biliary excretion of activity by the liver is seen. Prompt biliary tree and bowel activity was seen. Gallbladder activity was not seen at 90 minutes such that morphine was administered to ensure cystic duct patency. As such, ejection fraction could not be calculated. IMPRESSION: 1. Delayed gallbladder filling such that morphine had to be administered at 90 minutes with subsequent confirmation of cystic duct patency. As such, gallbladder ejection fraction could not be assessed. 2. Patent common bile duct. Electronically Signed   By: Monte Fantasia M.D.   On: 02/15/2018 16:53   Ct Abdomen Pelvis W Contrast  Result Date: 02/21/2018 CLINICAL DATA:  Acute onset of diarrhea. Right upper quadrant abdominal pain. EXAM: CT ABDOMEN AND PELVIS WITH CONTRAST TECHNIQUE: Multidetector CT imaging of the abdomen and pelvis was performed using the standard protocol following bolus administration of intravenous contrast. CONTRAST:  124mL OMNIPAQUE IOHEXOL 300 MG/ML  SOLN COMPARISON:  CT of the abdomen and pelvis performed 01/18/2017, and abdominal ultrasound performed 01/17/2018 FINDINGS: Lower chest: Small bilateral pleural effusions  are noted, with mild bibasilar atelectasis. Rumaldo Difatta calcified granuloma is noted at the left lung base. The heart is enlarged. Scattered coronary artery calcifications are seen. Hepatobiliary: The liver is grossly unremarkable in appearance. Stones are noted within the gallbladder. There is question of mild pericholecystic fluid. The common bile duct is normal in caliber. Pancreas: The pancreas is within normal limits. Spleen: The spleen is enlarged, measuring 15.2 cm in length. Adrenals/Urinary Tract: The adrenal glands are unremarkable in appearance. The kidneys are within normal limits.  There is no evidence of hydronephrosis. No renal or ureteral stones are identified. No perinephric stranding is seen. Stomach/Bowel: The stomach is unremarkable in appearance. The small bowel is within normal limits. The appendix is not visualized; there is no evidence for appendicitis. Scattered diverticulosis is noted along the sigmoid colon, without evidence of diverticulitis. Vascular/Lymphatic: Diffuse calcification is seen along the abdominal aorta and its branches. The abdominal aorta is otherwise grossly unremarkable. The inferior vena cava is grossly unremarkable. No retroperitoneal lymphadenopathy is seen. No pelvic sidewall lymphadenopathy is identified. Reproductive: The bladder is moderately distended and contains Dianelly Ferran small amount of air, possibly reflecting recent Foley catheter placement. The patient is status post hysterectomy. No suspicious adnexal masses are seen. Other: No additional soft tissue abnormalities are seen. Musculoskeletal: No acute osseous abnormalities are identified. The visualized musculature is unremarkable in appearance. IMPRESSION: 1. Cholelithiasis, with question of mild pericholecystic fluid. Right upper quadrant ultrasound could be considered to assess for cholecystitis. 2. Small bilateral pleural effusions, with mild bibasilar atelectasis. 3. Splenomegaly. 4. Scattered coronary artery calcifications seen.  Cardiomegaly. 5. Scattered diverticulosis along the sigmoid colon, without evidence of diverticulitis. Aortic Atherosclerosis (ICD10-I70.0). Electronically Signed   By: Garald Balding M.D.   On: 02/21/2018 21:18   Dg Chest Port 1 View  Result Date: 03/01/2018 CLINICAL DATA:  Shortness of breath. EXAM: PORTABLE CHEST 1 VIEW COMPARISON:  Chest radiograph February 01, 2017 FINDINGS: Stable cardiomegaly. Status post median sternotomy for CABG. Coronary artery calcification and/or stent. Calcified aortic arch. Scattered calcified granulomas. Small LEFT pleural effusion.  Interstitial prominence with mild bronchial wall thickening. No pneumothorax. Calcifications in the breast. Osteopenia. IMPRESSION: Stable cardiomegaly. Interstitial prominence slightly increased from prior examination most consistent with acute on chronic pulmonary edema. Increased small LEFT pleural effusion. Electronically Signed   By: Elon Alas M.D.   On: 03/01/2018 00:05   US Abdomen Limited Ruq  Result Date: 02/22/2018 CLINICAL DATA:  Right upper quadrant pain with nausea and vomiting EXAM: ULTRASOUND ABDOMEN LIMITED RIGHT UPPER QUADRANT COMPARISON:  CT abdomen and pelvis Feb 21, 2018 FINDINGS: Gallbladder: Within the gallbladder, there are echogenic foci which move and shadow consistent with cholelithiasis. Largest gallstone measures 2.1 cm in length. Gallbladder wall appears thickened and edematous. There is trace pericholecystic fluid. No sonographic Murphy sign noted by sonographer. Common bile duct: Diameter: 5 mm. No intrahepatic or extrahepatic biliary duct dilatation. Liver: No focal lesion identified. Within normal limits in parenchymal echogenicity. Portal vein is patent on color Doppler imaging with normal direction of blood flow towards the liver. There is Cenia Zaragosa right pleural effusion. The right kidney is noted to be echogenic with normal renal cortical thickness. IMPRESSION: 1. Gallstones with gallbladder wall thickening and slight pericholecystic fluid. The appearance of the gallbladder is felt to be indicative of acute cholecystitis. 2. Echogenic right kidney, Izabel Chim finding concerning for medical renal disease. Appropriate laboratory correlation in this regard advised. 3.  Small right pleural effusion. Electronically Signed   By: Lowella Grip III  M.D.   On: 02/22/2018 10:13    Microbiology: No results found for this or any previous visit (from the past 240 hour(s)).   Labs: Basic Metabolic Panel: Recent Labs  Lab 03/08/18 0430 03/09/18 0644 03/10/18 0702 03/11/18 0439  03/13/18 0538  NA 131* 135 133* 134* 134*  K 3.9 3.7 4.2 3.9 4.0  CL 95* 98* 93* 96* 97*  CO2 28 31 31  33* 29  GLUCOSE 125* 101* 111* 103* 102*  BUN 13 17 19  22* 30*  CREATININE 0.88 0.88 1.01* 0.96 0.96  CALCIUM 8.6* 8.3* 8.7* 8.2* 8.6*   Liver Function Tests: No results for input(s): AST, ALT, ALKPHOS, BILITOT, PROT, ALBUMIN in the last 168 hours. No results for input(s): LIPASE, AMYLASE in the last 168 hours. No results for input(s): AMMONIA in the last 168 hours. CBC: Recent Labs  Lab 03/13/18 0538  WBC 5.6  HGB 9.1*  HCT 30.7*  MCV 76.8*  PLT 269   Cardiac Enzymes: Recent Labs  Lab 03/06/18 1803 03/06/18 2328  TROPONINI <0.03 <0.03   BNP: BNP (last 3 results) Recent Labs    02/21/18 2354 03/06/18 1352  BNP 690.0* 790.8*    ProBNP (last 3 results) No results for input(s): PROBNP in the last 8760 hours.  CBG: Recent Labs  Lab 03/12/18 1140 03/12/18 1636 03/12/18 2052 03/13/18 0732 03/13/18 1153  GLUCAP 191* 138* 169* 127* 157*       Signed:  Fayrene Helper MD.  [facility] 03/13/2018, 3:44 PM

## 2018-03-13 NOTE — Evaluation (Signed)
Occupational Therapy Evaluation Patient Details Name: [name] MRN: [id number] DOB: 1941-07-13 Today's Date: 03/13/2018    History of Present Illness [name] is a 77 y.o. female with medical history significant for hypertension, chronic grade 3 diastolic heart failure, deficiency anemia, chronic atrial fibrillation chronic status post AVR on warfarin, diabetes on oral agents. Pt recently d/c from hospital on 5/28 s/p cholecystecomty. Pt came to ED with SOB and chest pressure.   Clinical Impression   PTA, pt was living home alone and was requiring assistance for bathing and using RW for functional mobility. Pt presenting with decreased activity tolerance and balance. Providing pt with education on safe tub transfer with and without tub bench. Pt performing tub transfer with Min A for safety and balance. Providing family and pt with information of purchase of tub bench if needed/wanted. Pt planning for dc later today and defer further OT needs to [place]. Recommend dc to home with HHOT for further OT to optimize safety, independence with ADLs, and return to PLOF.      Follow Up Recommendations  Home health OT;Supervision/Assistance - 24 hour    Equipment Recommendations  None recommended by OT(Providing education on tub bench for purchase)    Recommendations for Other Services PT consult     Precautions / Restrictions Precautions Precautions: Fall Precaution Comments: watch O2 Restrictions Weight Bearing Restrictions: No      Mobility Bed Mobility               General bed mobility comments: Sitting at EOB upon arrival  Transfers Overall transfer level: Needs assistance Equipment used: None Transfers: Sit to/from Stand Sit to Stand: Supervision         General transfer comment: supervision for safety    Balance Overall balance assessment: Needs assistance Sitting-balance support: No upper extremity supported;Feet supported Sitting balance-Leahy Scale:  Good     Standing balance support: During functional activity;No upper extremity supported Standing balance-Leahy Scale: Fair                             ADL either performed or assessed with clinical judgement   ADL Overall ADL's : Needs assistance/impaired Eating/Feeding: Independent;Sitting   Grooming: Standing;Supervision/safety;Set up   Upper Body Bathing: Set up;Sitting   Lower Body Bathing: Supervison/ safety;Sit to/from stand   Upper Body Dressing : Set up;Sitting   Lower Body Dressing: Supervision/safety;Sit to/from stand   Toilet Transfer: Supervision/safety;Ambulation;BSC         Tub/Shower Transfer Details (indicate cue type and reason): Educating pt on use of tub bench for tub transfers. Providing pt and family on purchasing tub bench information. Pt reporting she had an RN who came to her house before admission and said she was ordering tub bench for her. Also education pt on safe tub trnasfer techniques for shower chair and RW.  Pt performing a simulated tub transfer with RW and shower seat. Required Min A for balance ans safety. daughter particiapting. Recommending pt have someone with her during transfers.  Functional mobility during ADLs: Min guard;Rolling walker General ADL Comments: Pt with decreased activity tolerance. SpO2 90% on RA. RN notified.     Vision Baseline Vision/History: Wears glasses Wears Glasses: At all times Patient Visual Report: No change from baseline       Perception     Praxis      Pertinent Vitals/Pain Pain Assessment: Faces Faces Pain Scale: Hurts even more Pain Location: bilat LEs during  donning of ted hose Pain Descriptors / Indicators: Sore Pain Intervention(s): Monitored during session;Repositioned;Limited activity within patient's tolerance     Hand Dominance Right   Extremity/Trunk Assessment Upper Extremity Assessment Upper Extremity Assessment: Generalized weakness   Lower Extremity  Assessment Lower Extremity Assessment: Generalized weakness   Cervical / Trunk Assessment Cervical / Trunk Assessment: Normal   Communication Communication Communication: No difficulties   Cognition Arousal/Alertness: Awake/alert Behavior During Therapy: WFL for tasks assessed/performed Overall Cognitive Status: Within Functional Limits for tasks assessed                                     General Comments  Daughter present throughout session    Exercises     Shoulder Instructions      Home Living Family/patient expects to be discharged to:: Private residence Living Arrangements: Alone Available Help at Discharge: Family;Available PRN/intermittently;Neighbor Type of Home: House Home Access: Stairs to enter [employer] of Steps: 3 Entrance Stairs-Rails: Right Home Layout: One level     Bathroom Shower/Tub: Teacher, early years/pre: Handicapped height Bathroom Accessibility: Yes   Home Equipment: Environmental consultant - 2 wheels          Prior Functioning/Environment Level of Independence: Independent  Gait / Transfers Assistance Needed: Used RW for ambulation  ADL's / Homemaking Assistance Needed: Required assist for bathing.    Comments: pt reports she was driving and doin grocery shopping up until march of this year. Pt also reports "my neighbor stays with me at night"        OT Problem List: Decreased activity tolerance;Impaired balance (sitting and/or standing)      OT Treatment/Interventions: Self-care/ADL training;DME and/or AE instruction;Energy conservation;Balance training;Patient/family education    OT Goals(Current goals can be found in the care plan section) Acute Rehab OT Goals Patient Stated Goal: get off O2 OT Goal Formulation: With patient Time For Goal Achievement: 03/11/18 Potential to Achieve Goals: Good  OT Frequency: Min 2X/week   Barriers to D/C:            Co-evaluation              AM-PAC PT "6  Clicks" Daily Activity     Outcome Measure Help from another person eating meals?: None Help from another person taking care of personal grooming?: A Little Help from another person toileting, which includes using toliet, bedpan, or urinal?: A Little Help from another person bathing (including washing, rinsing, drying)?: A Little Help from another person to put on and taking off regular upper body clothing?: None Help from another person to put on and taking off regular lower body clothing?: A Little 6 Click Score: 20   End of Session Equipment Utilized During Treatment: Rolling walker Nurse Communication: Mobility status(90% SpO2 on RA)  Activity Tolerance: Patient tolerated treatment well Patient left: with call bell/phone within reach;in bed;with family/visitor present(EOB)  OT Visit Diagnosis: Other abnormalities of gait and mobility (R26.89)                Time: 5361-4431 OT Time Calculation (min): 18 min Charges:  OT General Charges $OT Visit: 1 Visit OT Evaluation $OT Eval Moderate Complexity: 1 Mod G-Codes:     [place], OTR/L Acute Rehab Pager: [phone] Office: [place] 03/13/2018, 4:34 PM

## 2018-03-13 NOTE — Progress Notes (Signed)
Nutrition Follow-up  DOCUMENTATION CODES:   Non-severe (moderate) malnutrition in context of chronic illness  INTERVENTION:   -D/c Glucerna BID, due to poor acceptance -Continue MVI with minerals daily -Continue 30 ml Prostat BID, each supplement provides 100 kcals and 15 grams of protein  NUTRITION DIAGNOSIS:   Moderate Malnutrition related to chronic illness(CHF) as evidenced by energy intake < 75% for > or equal to 1 month, mild fat depletion, moderate fat depletion, mild muscle depletion, edema.  Ongoing  GOAL:   Patient will meet greater than or equal to 90% of their needs  Progressing  MONITOR:   PO intake, Supplement acceptance, Labs, Weight trends, Skin, I & O's  REASON FOR ASSESSMENT:   Malnutrition Screening Tool    ASSESSMENT:   [name] is a 77 y.o. female with a Past Medical History of PAD; moderate to severe HTN; mechanical valve on Coumadin; HLD; DM; CAD; and afib who presents with persistent volume overload after d/c yesterday.   Reviewed I/O's: -2 1 L x 24 hours and -11.8 L since admission.   Pt out of room at time of visit; ambulating hallways with mobility tech.   Intake has improved since last visit; noted meal completion 85-100%. Pt is refusing Glucerna supplements, but taking Prostat and MVI.   Per cardiology notes, INR at goal today.   PT recommending home with home health services.   Labs reviewed: Na: 134, CBGS: 127-191 (inpatient orders 0-5 units insulin aspart q HS and 0-9 units insulin aspart TID with meals).   Diet Order:   Diet Order           Diet heart healthy/carb modified Room service appropriate? Yes; Fluid consistency: Thin  Diet effective now          EDUCATION NEEDS:   Education needs have been addressed  Skin:  Skin Assessment: Reviewed RN Assessment  Last BM:  03/11/18  Height:   Ht Readings from Last 1 Encounters:  03/06/18 5\' 1"  (1.549 m)    Weight:   Wt Readings from Last 1 Encounters:  03/13/18  126 lb (57.2 kg)    Ideal Body Weight:  47.7 kg  BMI:  Body mass index is 23.81 kg/m.  Estimated Nutritional Needs:   Kcal:  1700-1900  Protein:  85-100 grams  Fluid:  1.7-1.9 L    [name], RD, LDN, CDE Pager: [phone] After hours Pager: [phone]

## 2018-03-13 NOTE — Progress Notes (Signed)
Patient is arranged with [facility]; Dorian Pod with [facility] called and updated concerning INR checks; Aneta Mins [phone]

## 2018-03-13 NOTE — Progress Notes (Signed)
ANTICOAGULATION CONSULT NOTE - Follow Up Consult  Pharmacy Consult for Coumadin Indication: atrial fibrillation and mechanical AVR  Labs: Recent Labs    03/11/18 0439 03/12/18 0539 03/13/18 0538  HGB  --   --  9.1*  HCT  --   --  30.7*  PLT  --   --  269  LABPROT 21.4* 21.8* 23.7*  INR 1.87 1.92 2.14  CREATININE 0.96  --  0.96   Assessment: 77 yo F on Coumadin PTA for mechanical AVR and afib.  Coumadin was held last week for cholecystectomy.  Pt was discharged 5/28 s/p cholecystectomy. Returned to ED with SOB.  Noted to have elevated INR on presentation.  INR: 2.14 this morning. Cardiology would like to aim for a goal of 2-3 and consider 2-2.5 given history of recent GI bleed. This is a change from the previous 2.5-3.5 INR goal. Since INR is therapeutic this morning, will D/C bridge therapy.  PTA Dosing: 7.5mg  MWF, 5mg  TTSS, admit INR was 4.5  Goal of Therapy:  INR 2.0-3.0 Monitor platelets by anticoagulation protocol: Yes   Plan:  Coumadin 5mg  PO x 1 tonight D/C Lovenox Continue daily INR  Georga Bora, PharmD Clinical Pharmacist 03/13/2018 8:54 AM

## 2018-03-13 NOTE — Progress Notes (Signed)
Paged Care management in the ED for patient needs for RN- INR, checks and PT.

## 2018-03-15 ENCOUNTER — Encounter: Payer: Self-pay | Admitting: *Deleted

## 2018-03-15 ENCOUNTER — Other Ambulatory Visit: Payer: Self-pay | Admitting: *Deleted

## 2018-03-15 NOTE — Patient Outreach (Signed)
[facility]) [place] Telephone Outreach PCP office completes transition of care follow up post-hospital discharge Post-hospital discharge day 2 Unsuccessful telephone outreach attempt #1  03/15/2018  REET SCHARRER [phone]  Unsuccessful telephone outreach to [name], 77 y/o female referred to [facility] after recent hospitalization May 16-28, 2019 for abdominal pain, nausea/ vomiting/ diarrhea; patient has lap chole on Mar 01, 2018 and was discharged home to self-care with home health services for RN/ PT through [facility] and DME for (new) home O2 through [facility].  Patient has history including, but not limited to, HTN/ HLD; DM; GERD; A-fib; AVR on ACT; PVD; CHF; CAD; and anxiety.  Unfortunately, patient experienced hospital re-admission one day after her last discharge on May 29-March 13, 2018 for shortness of breath, hypoxia, pulmonary edema consistent with dCHF exacerbation.  Patient was again discharged home to self-care with home health services in place through [facility] home health agency for nursing and PT.  HIPAA compliant voice mail message left for patient, requesting return call back.  Plan:  Will place [facility] CM unsuccessful outreach letter in mail to patient, requesting call back in writing  Will make covering Circle aware of unsuccessful call attempt today.  Oneta Rack, RN, BSN, CCRN [employer]

## 2018-03-17 DIAGNOSIS — I2510 Atherosclerotic heart disease of native coronary artery without angina pectoris: Secondary | ICD-10-CM | POA: Diagnosis not present

## 2018-03-17 DIAGNOSIS — D649 Anemia, unspecified: Secondary | ICD-10-CM | POA: Diagnosis not present

## 2018-03-17 DIAGNOSIS — Z7984 Long term (current) use of oral hypoglycemic drugs: Secondary | ICD-10-CM | POA: Diagnosis not present

## 2018-03-17 DIAGNOSIS — I4891 Unspecified atrial fibrillation: Secondary | ICD-10-CM | POA: Diagnosis not present

## 2018-03-17 DIAGNOSIS — E1151 Type 2 diabetes mellitus with diabetic peripheral angiopathy without gangrene: Secondary | ICD-10-CM | POA: Diagnosis not present

## 2018-03-17 DIAGNOSIS — I10 Essential (primary) hypertension: Secondary | ICD-10-CM | POA: Diagnosis not present

## 2018-03-17 DIAGNOSIS — K8020 Calculus of gallbladder without cholecystitis without obstruction: Secondary | ICD-10-CM | POA: Diagnosis not present

## 2018-03-17 DIAGNOSIS — Z952 Presence of prosthetic heart valve: Secondary | ICD-10-CM | POA: Diagnosis not present

## 2018-03-17 DIAGNOSIS — E7800 Pure hypercholesterolemia, unspecified: Secondary | ICD-10-CM | POA: Diagnosis not present

## 2018-03-18 ENCOUNTER — Ambulatory Visit (INDEPENDENT_AMBULATORY_CARE_PROVIDER_SITE_OTHER): Payer: Medicare HMO | Admitting: Pharmacist

## 2018-03-18 DIAGNOSIS — Z952 Presence of prosthetic heart valve: Secondary | ICD-10-CM | POA: Diagnosis not present

## 2018-03-18 DIAGNOSIS — Z7901 Long term (current) use of anticoagulants: Secondary | ICD-10-CM | POA: Diagnosis not present

## 2018-03-18 DIAGNOSIS — I481 Persistent atrial fibrillation: Secondary | ICD-10-CM | POA: Diagnosis not present

## 2018-03-18 DIAGNOSIS — Z7984 Long term (current) use of oral hypoglycemic drugs: Secondary | ICD-10-CM | POA: Diagnosis not present

## 2018-03-18 DIAGNOSIS — I2510 Atherosclerotic heart disease of native coronary artery without angina pectoris: Secondary | ICD-10-CM | POA: Diagnosis not present

## 2018-03-18 DIAGNOSIS — D649 Anemia, unspecified: Secondary | ICD-10-CM | POA: Diagnosis not present

## 2018-03-18 DIAGNOSIS — I4891 Unspecified atrial fibrillation: Secondary | ICD-10-CM | POA: Diagnosis not present

## 2018-03-18 DIAGNOSIS — I4819 Other persistent atrial fibrillation: Secondary | ICD-10-CM

## 2018-03-18 DIAGNOSIS — K8020 Calculus of gallbladder without cholecystitis without obstruction: Secondary | ICD-10-CM | POA: Diagnosis not present

## 2018-03-18 DIAGNOSIS — E1151 Type 2 diabetes mellitus with diabetic peripheral angiopathy without gangrene: Secondary | ICD-10-CM | POA: Diagnosis not present

## 2018-03-18 DIAGNOSIS — E7800 Pure hypercholesterolemia, unspecified: Secondary | ICD-10-CM | POA: Diagnosis not present

## 2018-03-18 DIAGNOSIS — I10 Essential (primary) hypertension: Secondary | ICD-10-CM | POA: Diagnosis not present

## 2018-03-18 LAB — POCT INR: INR: 2.4 (ref 2.0–3.0)

## 2018-03-18 NOTE — Patient Instructions (Signed)
Description   Continue with 1 tablet daily except 1.5 tablets each Monday. Wednesday and Friday.  Repeat INR in 2 weeks.

## 2018-03-19 ENCOUNTER — Ambulatory Visit: Payer: Medicare HMO | Admitting: Physician Assistant

## 2018-03-19 ENCOUNTER — Encounter: Payer: Self-pay | Admitting: Physician Assistant

## 2018-03-19 ENCOUNTER — Ambulatory Visit (INDEPENDENT_AMBULATORY_CARE_PROVIDER_SITE_OTHER): Payer: Medicare HMO | Admitting: Physician Assistant

## 2018-03-19 VITALS — BP 102/58 | HR 64 | Ht 61.0 in | Wt 124.6 lb

## 2018-03-19 DIAGNOSIS — Z5181 Encounter for therapeutic drug level monitoring: Secondary | ICD-10-CM

## 2018-03-19 DIAGNOSIS — I10 Essential (primary) hypertension: Secondary | ICD-10-CM

## 2018-03-19 NOTE — Progress Notes (Signed)
Cardiology Office Note   Date:  03/19/2018   ID:  [name], DOB [birth year], MRN [id number]  PCP:  [name] Pao, MD  Cardiologist: Dr. Gwenlyn [name] 06/20/2017 [name] Ferries, PA-C   Chief Complaint  Patient presents with  . Hospitalization Follow-up    redness on legs    History of Present Illness: [name] is a 77 y.o. female with a history of CABG in 07/2004 w/ LIMA-LAD & SVG-PLA, AVR with [employer]. Jude valve -on coumadin, PVOD, permanent a fib, HTN, HLD, DM, D-CHF  Admitted 5/16-5/28 forcholelithiasis and subacute cholecystitis s/p cholecystectomy Admitted 529-03/13/2018 for acute hypoxic respiratory failure, secondary to acute CHF/ volume overload  [name] presents for cardiology follow up.  Her son is with her today.  He is helping to make sure that she is diet and medication compliant.  She is slowly improving. She is walking in the house, but cannot do very much. PT is coming to see her. Nurses coming out, checking INR.   She weighs daily, her weight is 126. She feels she is managing her diet well. She is willing to keep doing this.   She wonders if she was sent home too soon.   She wonders if she needs to gain weight.  The Pro-stat is very expensive, can they do something else for protein supplement?  Petechial rash does not itch, started in the hospital, is a little better now.   She is off the hydralazine, wonders if it needs to be restarted.  She has an appointment with Dr. Osborne [name] later this week.  She has incentive spirometry, is using it.  Wonders if she needs to keep using it.  Wonders if she has always had heart failure.  Wonders why she was never told about heart failure before.  Wonders how long she has been in atrial fibrillation.   Past Medical History:  Diagnosis Date  . Aortic atherosclerosis ([place]) 01/23/2018  . Atrial fibrillation ([place])   . Carotid artery disease ([place])   . Cholelithiasis 01/23/2018  . Chronic anticoagulation     . Coronary artery disease    status post coronary artery bypass grafting times 07/10/2004  . Diabetes mellitus   . Hypercholesteremia   . Hypertension   . Mechanical heart valve present    H. aortic valve replacement at the time of bypass surgery October 2005  . Moderate to severe pulmonary hypertension ([place])   . Peripheral arterial disease ([facility])    history of left common iliac artery PTA and stenting for a chronic total occlusion 08/26/01    Past Surgical History:  Procedure Laterality Date  . AORTIC VALVE REPLACEMENT    . CARDIAC CATHETERIZATION  11/10/2004   40% right common illiac, 70% in stent restenosis of distal left common illiac,   . CARDIAC CATHETERIZATION  05/18/2004   LAD 50-70% midstenosis, RCA dominant w/50% stenosis, 50% Right common Illiac artery ostial stenosis, 90% in stent restenosis within midportion of left common illiac stent  . Carotid Duplex  03/12/2012   RSA-elev. velocities suggestive of a 50-69% diameter reduction, Right&Left Bulb/Prox ICA-mild-mod.fibrous plaqueelevating Velocities abnormal study.  . CHOLECYSTECTOMY N/A 03/01/2018   Procedure: LAPAROSCOPIC CHOLECYSTECTOMY;  Surgeon: Judeth Horn, MD;  Location: [facility];  Service: General;  Laterality: N/A;  . COLONOSCOPY WITH PROPOFOL N/A 01/22/2018   Procedure: COLONOSCOPY WITH PROPOFOL;  Surgeon: Wilford Corner, MD;  Location: [facility];  Service: Endoscopy;  Laterality: N/A;  . ESOPHAGOGASTRODUODENOSCOPY (EGD) WITH PROPOFOL N/A 01/22/2018   Procedure:  ESOPHAGOGASTRODUODENOSCOPY (EGD) WITH PROPOFOL;  Surgeon: Wilford Corner, MD;  Location: [facility];  Service: Endoscopy;  Laterality: N/A;  . Lower Ext. Duplex  03/12/2012   Right Proximal CIA- vessel narrowing w/elevated velocities 0-49% diameter reduction. Right SFA-mild mixed density plaque throughout vessel.  [name] NM MYOCAR PERF WALL MOTION  05/19/2010   protocol: Persantine, post stress EF 65%, negative for ischemia, low risk scan  . TRANSTHORACIC  ECHOCARDIOGRAM  08/29/2012   Moderately calcified annulus of mitral valve, moderate regurg. of both mitral valve and tricuspid valve.     Current Outpatient Medications  Medication Sig Dispense Refill  . acetaminophen (TYLENOL) 325 MG tablet Take 2 tablets (650 mg total) by mouth every 6 (six) hours as needed for mild pain, fever or headache (for pain or headaches).    . ALPRAZolam (XANAX) 0.5 MG tablet Take 0.25-0.5 mg by mouth See admin instructions. Take 0.25-0.5 mg two times a day as needed for anxiety and 0.25-0.5 mg at bedtime scheduled    . Amino Acids-Protein Hydrolys (FEEDING SUPPLEMENT, PRO-STAT SUGAR FREE 64,) LIQD Take 30 mLs by mouth 2 (two) times daily. 1800 mL 0  . atorvastatin (LIPITOR) 80 MG tablet Take 1 tablet (80 mg total) by mouth daily. (Patient taking differently: Take 80 mg by mouth daily with supper. ) 30 tablet 0  . Cholecalciferol (VITAMIN D3) 1000 UNITS CAPS Take 1,000 Units by mouth daily.     . ferrous sulfate 325 (65 FE) MG tablet Take 1 tablet (325 mg total) by mouth 2 (two) times daily with a meal. 60 tablet 3  . furosemide (LASIX) 20 MG tablet Take 4 tablets (80 mg total) by mouth daily. 120 tablet 0  . glipiZIDE (GLUCOTROL XL) 2.5 MG 24 hr tablet Take 2.5 mg by mouth daily.     . hydrocortisone cream 1 % Apply topically 2 (two) times daily. 30 g 0  . irbesartan (AVAPRO) 300 MG tablet Take 1 tablet (300 mg total) by mouth daily. 30 tablet 0  . magnesium oxide (MAG-OX) 400 (241.3 Mg) MG tablet Take 1 tablet (400 mg total) by mouth daily.    . metFORMIN (GLUCOPHAGE) 500 MG tablet Take 500 mg by mouth 2 (two) times daily with a meal.     . metoprolol tartrate (LOPRESSOR) 50 MG tablet Take 50 mg by mouth 2 (two) times daily.    . ondansetron (ZOFRAN ODT) 4 MG disintegrating tablet 4mg  ODT q6-8 hours prn nausea/vomit 12 tablet 0  . pantoprazole (PROTONIX) 40 MG tablet Take 40 mg by mouth daily.     Vladimir Faster Glyc-Propyl Glyc PF (SYSTANE PRESERVATIVE FREE) 0.4-0.3 %  SOLN Place 1-2 drops into both eyes 3 (three) times daily as needed (for irritation).     . potassium chloride SA (K-DUR,KLOR-CON) 20 MEQ tablet Take 1 tablet (20 mEq total) by mouth daily. 30 tablet 0  . saccharomyces boulardii (FLORASTOR) 250 MG capsule Take 1 capsule (250 mg total) by mouth 2 (two) times daily. 60 capsule 1  . traMADol (ULTRAM) 50 MG tablet Take 1 tablet (50 mg total) by mouth every 6 (six) hours as needed for moderate pain. 15 tablet 0  . vitamin B-12 (CYANOCOBALAMIN) 100 MCG tablet Take 100 mcg by mouth daily.     [name] warfarin (COUMADIN) 5 MG tablet Take 1 tablet (5 mg total) by mouth daily at 6 PM. 30 tablet 0   No current facility-administered medications for this visit.     Allergies:   Flagyl [metronidazole]; Coreg [carvedilol]; Losartan; Spironolactone;  Verapamil; Zetia [ezetimibe]; and Zocor [simvastatin - high dose]    Social History:  The patient  reports that she has quit smoking. Her smoking use included cigarettes. She has a 30.00 pack-year smoking history. She has never used smokeless tobacco. She reports that she does not drink alcohol or use drugs.   Family History:  [name] family history is not on file.    ROS:  Please see the history of present illness. All other systems are reviewed and negative.    PHYSICAL EXAM: VS:  BP (!) 102/58   Pulse 64   Ht 5\' 1"  (1.549 m)   Wt 124 lb 9.6 oz (56.5 kg)   BMI 23.54 kg/m  , BMI Body mass index is 23.54 kg/m. GEN: Well nourished, frail elderly, female in no acute distress  HEENT: normal for age  Neck: Minimal JVD, no carotid bruit, no masses Cardiac: Irregular rate and rhythm; mechanical valve click and soft murmur noted, no rubs, or gallops Respiratory: Few rales bases bilaterally, normal work of breathing GI: soft, nontender, nondistended, + BS MS: no deformity or atrophy; no edema; distal pulses are 2+ in all 4 extremities   Skin: warm and dry, no rash Neuro:  Strength and sensation are  intact Psych: euthymic mood, full affect   EKG:  EKG is not ordered today.  Echo 02/24/2018 Study Conclusions - Left ventricle: The cavity size was normal. Wall thickness was normal. Systolic function was normal. The estimated ejection fraction was in the range of 55% to 60%. Wall motion was normal; there were no regional wall motion abnormalities. Doppler parameters are consistent with a reversible restrictive pattern, indicative of decreased left ventricular diastolic compliance and/or increased left atrial pressure (grade 3 diastolic dysfunction). - Ventricular septum: The contour showed diastolic flattening. - Aortic valve: A mechanical prosthesis was present and functioning normally. Peak velocity (S): 238 cm/s. Mean gradient (S): 10 mm Hg. Valve area (VTI): 2.24 cm^2. Valve area (Vmax): 2.26 cm^2. Valve area (Vmean): 1.88 cm^2. - Mitral valve: Calcified annulus. There was mild regurgitation. Valve area by continuity equation (using LVOT flow): 2.53 cm^2. - Left atrium: The atrium was moderately to severely dilated. - Right ventricle: The cavity size was moderately dilated. Wall thickness was normal. Systolic function was moderately to severely reduced. - Right atrium: The atrium was severely dilated. - Tricuspid valve: There was severe regurgitation. - Pulmonary arteries: Systolic pressure was severely increased. PA peak pressure: 88 mm Hg (S). Impressions: - When compared to prior, PA pressures have increased.   Recent Labs: 03/02/2018: ALT 15 03/06/2018: B Natriuretic Peptide 790.8 03/13/2018: BUN 30; Creatinine, Ser 0.96; Hemoglobin 9.1; Platelets 269; Potassium 4.0; Sodium 134    Lipid Panel No results [name] for: CHOL, TRIG, HDL, CHOLHDL, VLDL, LDLCALC, LDLDIRECT   Wt Readings from Last 3 Encounters:  03/19/18 124 lb 9.6 oz (56.5 kg)  03/13/18 126 lb (57.2 kg)  03/05/18 147 lb 4.8 oz (66.8 kg)     Other studies  Reviewed: Additional studies/ records that were reviewed today include: Office notes, hospital records and testing.  ASSESSMENT AND PLAN:  1.  Chronic diastolic CHF: Her volume status is good by exam.  I explained that her echo this admission was the first time we had seen the grade 3 diastolic dysfunction.   -She is encouraged to continue her current Lasix dosing as her weight has been stable since discharge.   -She has an appointment with Dr. Osborne [name] on Friday, check a BMET at that time as  he was going to do some other labs. -Call for weight gain of 3 pounds in a day or 5 pounds in a week  2.  Permanent atrial fibrillation: She is not having palpitations and her heart rate is controlled. -I explained that we had not really focused on the atrial fibrillation because her heart rate was good and she was already on Coumadin.  Continue metoprolol.  3.  Chronic anticoagulation: -Her Coumadin is followed by the Coumadin clinic, INR was 2.4 last  4.  Poor nutritional status: She has hypoalbuminemia and hypoproteinemia.  Her calcium level is also low. -Her son states they will not be able to afford the Pro-Stat long-term. -It is okay for her to give protein supplements in meal supplements such as [employer], discuss further with Dr. Osborne [name]. -Breakfast is the meal where she eats the most poorly   Current medicines are reviewed at length with the patient today.  The patient has concerns regarding medicines.  Concerns were addressed  The following changes have been made: Okay to use over-the-counter protein supplement or meal supplement  Labs/ tests ordered today include:  No orders of the defined types were placed in this encounter.    Disposition:   FU with Dr. Gwenlyn [name]  Signed, [name] Ferries, PA-C  03/19/2018 10:03 AM    Day Phone: [phone]; Fax: [phone]  This note was written with the assistance of speech recognition  software. Please excuse any transcriptional errors.

## 2018-03-19 NOTE — Patient Instructions (Signed)
Medication Instructions:  Your physician recommends that you continue on your current medications as directed. Please refer to the Current Medication list given to you today.  Labwork: BMET WHEN YOU SEE [name]  Testing/Procedures: NONE  Follow-Up: Your physician recommends that you schedule a follow-up appointment in: 4-6 WEEKS WITH DR [name]   Any Other Special Instructions Will Be Listed Below (If Applicable).  CONTINUE TO WEIGH DAILY. CALL THE OFFICE IF YOU GAIN 3 POUNDS IN 24 HOURS OR 5 POUNDS IN 1 WEEK   WATCH YOUR SODIUM INTAKE   OK TO USE ENSURE LIKE SUPPLEMENTS INSTEAD OF THE PRO-STAT   If you need a refill on your cardiac medications before your next appointment, please call your pharmacy.   Low-Sodium Eating Plan Sodium, which is an element that makes up salt, helps you maintain a healthy balance of fluids in your body. Too much sodium can increase your blood pressure and cause fluid and waste to be held in your body. Your health care provider or dietitian may recommend following this plan if you have high blood pressure (hypertension), kidney disease, liver disease, or heart failure. Eating less sodium can help lower your blood pressure, reduce swelling, and protect your heart, liver, and kidneys. What are tips for following this plan? General guidelines  Most people on this plan should limit their sodium intake to 1,500-2,000 mg (milligrams) of sodium each day. Reading food labels  The Nutrition Facts label lists the amount of sodium in one serving of the food. If you eat more than one serving, you must multiply the listed amount of sodium by the number of servings.  Choose foods with less than 140 mg of sodium per serving.  Avoid foods with 300 mg of sodium or more per serving. Shopping  Look for lower-sodium products, often labeled as "low-sodium" or "no salt added."  Always check the sodium content even if foods are labeled as "unsalted" or "no salt  added".  Buy fresh foods. ? Avoid canned foods and premade or frozen meals. ? Avoid canned, cured, or processed meats  Buy breads that have less than 80 mg of sodium per slice. Cooking  Eat more home-cooked food and less restaurant, buffet, and fast food.  Avoid adding salt when cooking. Use salt-free seasonings or herbs instead of table salt or sea salt. Check with your health care provider or pharmacist before using salt substitutes.  Cook with plant-based oils, such as canola, sunflower, or olive oil. Meal planning  When eating at a restaurant, ask that your food be prepared with less salt or no salt, if possible.  Avoid foods that contain MSG (monosodium glutamate). MSG is sometimes added to [place] food, bouillon, and some canned foods. What foods are recommended? The items listed may not be a complete list. Talk with your dietitian about what dietary choices are best for you. Grains Low-sodium cereals, including oats, puffed wheat and rice, and shredded wheat. Low-sodium crackers. Unsalted rice. Unsalted pasta. Low-sodium bread. Whole-grain breads and whole-grain pasta. Vegetables Fresh or frozen vegetables. "No salt added" canned vegetables. "No salt added" tomato sauce and paste. Low-sodium or reduced-sodium tomato and vegetable juice. Fruits Fresh, frozen, or canned fruit. Fruit juice. Meats and other protein foods Fresh or frozen (no salt added) meat, poultry, seafood, and fish. Low-sodium canned tuna and salmon. Unsalted nuts. Dried peas, beans, and lentils without added salt. Unsalted canned beans. Eggs. Unsalted nut butters. Dairy Milk. Soy milk. Cheese that is naturally low in sodium, such as ricotta cheese,  fresh mozzarella, or Swiss cheese Low-sodium or reduced-sodium cheese. Cream cheese. Yogurt. Fats and oils Unsalted butter. Unsalted margarine with no trans fat. Vegetable oils such as canola or olive oils. Seasonings and other foods Fresh and dried herbs and  spices. Salt-free seasonings. Low-sodium mustard and ketchup. Sodium-free salad dressing. Sodium-free light mayonnaise. Fresh or refrigerated horseradish. Lemon juice. Vinegar. Homemade, reduced-sodium, or low-sodium soups. Unsalted popcorn and pretzels. Low-salt or salt-free chips. What foods are not recommended? The items listed may not be a complete list. Talk with your dietitian about what dietary choices are best for you. Grains Instant hot cereals. Bread stuffing, pancake, and biscuit mixes. Croutons. Seasoned rice or pasta mixes. Noodle soup cups. Boxed or frozen macaroni and cheese. Regular salted crackers. Self-rising flour. Vegetables Sauerkraut, pickled vegetables, and relishes. Olives. [place] fries. Onion rings. Regular canned vegetables (not low-sodium or reduced-sodium). Regular canned tomato sauce and paste (not low-sodium or reduced-sodium). Regular tomato and vegetable juice (not low-sodium or reduced-sodium). Frozen vegetables in sauces. Meats and other protein foods Meat or fish that is salted, canned, smoked, spiced, or pickled. Bacon, ham, sausage, hotdogs, corned beef, chipped beef, packaged lunch meats, salt pork, jerky, pickled herring, anchovies, regular canned tuna, sardines, salted nuts. Dairy Processed cheese and cheese spreads. Cheese curds. Blue cheese. Feta cheese. String cheese. Regular cottage cheese. Buttermilk. Canned milk. Fats and oils Salted butter. Regular margarine. Ghee. Bacon fat. Seasonings and other foods Onion salt, garlic salt, seasoned salt, table salt, and sea salt. Canned and packaged gravies. Worcestershire sauce. Tartar sauce. Barbecue sauce. Teriyaki sauce. Soy sauce, including reduced-sodium. Steak sauce. Fish sauce. Oyster sauce. Cocktail sauce. Horseradish that you find on the shelf. Regular ketchup and mustard. Meat flavorings and tenderizers. Bouillon cubes. Hot sauce and Tabasco sauce. Premade or packaged marinades. Premade or packaged taco  seasonings. Relishes. Regular salad dressings. Salsa. Potato and tortilla chips. Corn chips and puffs. Salted popcorn and pretzels. Canned or dried soups. Pizza. Frozen entrees and pot pies. Summary  Eating less sodium can help lower your blood pressure, reduce swelling, and protect your heart, liver, and kidneys.  Most people on this plan should limit their sodium intake to 1,500-2,000 mg (milligrams) of sodium each day.  Canned, boxed, and frozen foods are high in sodium. Restaurant foods, fast foods, and pizza are also very high in sodium. You also get sodium by adding salt to food.  Try to cook at home, eat more fresh fruits and vegetables, and eat less fast food, canned, processed, or prepared foods. This information is not intended to replace advice given to you by your health care provider. Make sure you discuss any questions you have with your health care provider. Document Released: 03/17/2002 Document Revised: 09/18/2016 Document Reviewed: 09/18/2016 Elsevier Interactive Patient Education  [employer].

## 2018-03-20 ENCOUNTER — Other Ambulatory Visit: Payer: Self-pay

## 2018-03-20 NOTE — Patient Outreach (Signed)
[facility]) Care Management  03/20/2018  MIRAKLE TOMLIN 1941-01-08 [id number]  Subjective: "I've come a long way. I've done really good with my surgery."  Objective: none  Assessment: 77 year old with history including, but not limited to, HTN; DM; GERD; A-fib; AVR on ACT; PVD; CHF; CAD; and anxiety. Referred to [place] post hospitalization. Client was hospitalized  May 16-May 28 with cholecystitis and had laparoscopic cholecystectomy on Mar 01, 2018. Client was discharged home with home health and readmitted  May 29-June 5 with acute on chronic heart failure.   RNCM called to follow up. Client reports she is improving. She denies shortness of breath. she states she has a little edema in her leg. She states she is weighing herself daily and has maintained a weight of 126 pounds for the last several days. She reports home health began start of care, but has not seen them this week. She adds that she is supposed to be receiving a scale and blood pressure cuff from the agency, but she has not received it yet.  She states she saw cardiologist, Dr. [name] yesterday; she has a follow up with Dr. Hulen Skains tomorrow and will see Dr.[name] on Friday.   She denies any medication issues or questions.  No questions or concerns noted at this time.  Plan: update assigned RNCM.  Covering RNCM: Thea Silversmith, RN, MSN, [place] Coordinator Cell: [phone]

## 2018-03-21 DIAGNOSIS — Z7984 Long term (current) use of oral hypoglycemic drugs: Secondary | ICD-10-CM | POA: Diagnosis not present

## 2018-03-21 DIAGNOSIS — E7800 Pure hypercholesterolemia, unspecified: Secondary | ICD-10-CM | POA: Diagnosis not present

## 2018-03-21 DIAGNOSIS — D649 Anemia, unspecified: Secondary | ICD-10-CM | POA: Diagnosis not present

## 2018-03-21 DIAGNOSIS — I2510 Atherosclerotic heart disease of native coronary artery without angina pectoris: Secondary | ICD-10-CM | POA: Diagnosis not present

## 2018-03-21 DIAGNOSIS — I10 Essential (primary) hypertension: Secondary | ICD-10-CM | POA: Diagnosis not present

## 2018-03-21 DIAGNOSIS — I4891 Unspecified atrial fibrillation: Secondary | ICD-10-CM | POA: Diagnosis not present

## 2018-03-21 DIAGNOSIS — E1151 Type 2 diabetes mellitus with diabetic peripheral angiopathy without gangrene: Secondary | ICD-10-CM | POA: Diagnosis not present

## 2018-03-21 DIAGNOSIS — Z952 Presence of prosthetic heart valve: Secondary | ICD-10-CM | POA: Diagnosis not present

## 2018-03-21 DIAGNOSIS — K8020 Calculus of gallbladder without cholecystitis without obstruction: Secondary | ICD-10-CM | POA: Diagnosis not present

## 2018-03-22 ENCOUNTER — Ambulatory Visit: Payer: Medicare HMO | Admitting: Podiatry

## 2018-03-22 DIAGNOSIS — M109 Gout, unspecified: Secondary | ICD-10-CM | POA: Insufficient documentation

## 2018-03-22 DIAGNOSIS — M25572 Pain in left ankle and joints of left foot: Secondary | ICD-10-CM | POA: Diagnosis not present

## 2018-03-22 DIAGNOSIS — I480 Paroxysmal atrial fibrillation: Secondary | ICD-10-CM | POA: Diagnosis not present

## 2018-03-22 DIAGNOSIS — E7849 Other hyperlipidemia: Secondary | ICD-10-CM | POA: Diagnosis not present

## 2018-03-22 DIAGNOSIS — I5033 Acute on chronic diastolic (congestive) heart failure: Secondary | ICD-10-CM | POA: Diagnosis not present

## 2018-03-22 DIAGNOSIS — J9601 Acute respiratory failure with hypoxia: Secondary | ICD-10-CM | POA: Diagnosis not present

## 2018-03-22 DIAGNOSIS — Z952 Presence of prosthetic heart valve: Secondary | ICD-10-CM | POA: Diagnosis not present

## 2018-03-22 DIAGNOSIS — E440 Moderate protein-calorie malnutrition: Secondary | ICD-10-CM | POA: Diagnosis not present

## 2018-03-22 DIAGNOSIS — E871 Hypo-osmolality and hyponatremia: Secondary | ICD-10-CM | POA: Diagnosis not present

## 2018-03-22 DIAGNOSIS — M1A0790 Idiopathic chronic gout, unspecified ankle and foot, without tophus (tophi): Secondary | ICD-10-CM | POA: Diagnosis not present

## 2018-03-22 DIAGNOSIS — I10 Essential (primary) hypertension: Secondary | ICD-10-CM | POA: Diagnosis not present

## 2018-03-22 DIAGNOSIS — E1151 Type 2 diabetes mellitus with diabetic peripheral angiopathy without gangrene: Secondary | ICD-10-CM | POA: Diagnosis not present

## 2018-03-22 DIAGNOSIS — D638 Anemia in other chronic diseases classified elsewhere: Secondary | ICD-10-CM | POA: Diagnosis not present

## 2018-03-25 ENCOUNTER — Ambulatory Visit (INDEPENDENT_AMBULATORY_CARE_PROVIDER_SITE_OTHER): Payer: Medicare HMO | Admitting: Pharmacist Clinician (PhC)/ Clinical Pharmacy Specialist

## 2018-03-25 DIAGNOSIS — I10 Essential (primary) hypertension: Secondary | ICD-10-CM | POA: Diagnosis not present

## 2018-03-25 DIAGNOSIS — Z952 Presence of prosthetic heart valve: Secondary | ICD-10-CM | POA: Diagnosis not present

## 2018-03-25 DIAGNOSIS — Z7984 Long term (current) use of oral hypoglycemic drugs: Secondary | ICD-10-CM | POA: Diagnosis not present

## 2018-03-25 DIAGNOSIS — E1151 Type 2 diabetes mellitus with diabetic peripheral angiopathy without gangrene: Secondary | ICD-10-CM | POA: Diagnosis not present

## 2018-03-25 DIAGNOSIS — K8020 Calculus of gallbladder without cholecystitis without obstruction: Secondary | ICD-10-CM | POA: Diagnosis not present

## 2018-03-25 DIAGNOSIS — E7800 Pure hypercholesterolemia, unspecified: Secondary | ICD-10-CM | POA: Diagnosis not present

## 2018-03-25 DIAGNOSIS — I2510 Atherosclerotic heart disease of native coronary artery without angina pectoris: Secondary | ICD-10-CM | POA: Diagnosis not present

## 2018-03-25 DIAGNOSIS — Z7901 Long term (current) use of anticoagulants: Secondary | ICD-10-CM | POA: Diagnosis not present

## 2018-03-25 DIAGNOSIS — I4819 Other persistent atrial fibrillation: Secondary | ICD-10-CM

## 2018-03-25 DIAGNOSIS — I481 Persistent atrial fibrillation: Secondary | ICD-10-CM

## 2018-03-25 DIAGNOSIS — D649 Anemia, unspecified: Secondary | ICD-10-CM | POA: Diagnosis not present

## 2018-03-25 DIAGNOSIS — I4891 Unspecified atrial fibrillation: Secondary | ICD-10-CM | POA: Diagnosis not present

## 2018-03-25 LAB — POCT INR: INR: 1.7 — AB (ref 2.0–3.0)

## 2018-03-26 ENCOUNTER — Encounter: Payer: Self-pay | Admitting: *Deleted

## 2018-03-26 ENCOUNTER — Other Ambulatory Visit: Payer: Self-pay | Admitting: *Deleted

## 2018-03-26 NOTE — Patient Outreach (Signed)
[place] [facility]) [place] Telephone Outreach PCP office completes transition of care outreach post-hospital discharge Post-hospital discharge day 13  03/26/2018  [name] 1941/07/10 [id number]  Successful telephone outreach to [name], 77 y/o female referred to [place] after recent hospitalization May 16-28, 2019 for abdominal pain, nausea/ vomiting/ diarrhea; patient had lap chole on Mar 01, 2018 and was discharged home to self-care with home health services for RN/ PT through [facility] [facility] and DME for (new) home O2 through [place] care.Patient has history including, but not limited to, HTN/ HLD; DM; GERD; A-fib; AVR on ACT; PVD; CHF; CAD; and anxiety.  Unfortunately, patient experienced hospital re-admission one day after her last discharge on May 29-March 13, 2018 for shortness of breath, hypoxia, pulmonary edema consistent with dCHF exacerbation.  Patient was again discharged home to self-care with home health services in place through [facility] home health agency for nursing and PT.  HIPAA/ identity verified with patient during phone call today.  Today, patient reports that she is "doing so much better," and has "improved overall a great deal" since her recent hospital discharge.  Patient denies pain and sounds to be in no distress throughout entirety of phone call today.  Patient reports that she had "a short nosebleed" today, which was accompanied by a "short episode of tongue numbness;"  Patient reports this episode was short-lived, only lasting "less than 5 minutes."  Patient states she "is fine now."  Patient also reports recent gout flare and constipation due to "increased iron dose;" states both are better after PCP evaluation 03/22/18.  Patient further reports:  Medications: -- Has all medicationsand takes as prescribed;denies questions about current medications.  -- self-manage medications  -- denies  issues with swallowing medications -- declines medication review today by phone; states she understands all medication instructions  Home health [facility]) services: -- [place] services for PT, OT, RN in place through [facility] for PT and RN -- confirms that [facility] services have begun; RN visited yesterday; expecting [facility] PT to visit later today -- confirms that she is actively participating in all [facility] services and believes that she is "benefitting greatly" from services; confirms that she has phone numbers for [facility] team  Provider appointments: -- All recent and upcoming provider appointments were reviewed with patient today- patient verbalizes plans to attend all scheduled provider appointments, stating her son will transport her  Safety/ Mobility/ Falls: -- denies new/ recent falls -- assistive devices: uses walker post-hospital discharge, stating "weakness" after "back-to-back hospital visits."  States that she normally does not use assitive devices and has not experienced any falls "in over 7-8 years" -- general fall risks/ prevention education discussed with patient today  [place] representative needs: -- currently denies community resource needs, stating supportive family members that assist with care needs as indicated -- has friend/ neighbor who is temporarily staying overnight with her since her last hospital discharge to assist with needs -- family (son) provides transportation for patient to all provider appointments, errands, etc-- patient reports that she is not currently driving, but hopes to resume driving as her condition improves post-hospital discharge  Advanced Directive (AD) Planning:   --reports does not currently have exisisting AD in place; reports that she has the papers to create AD documents and "is still reviewing;" encouraged patient to do so, and offered assistance as needed; patient verbalizes understanding and agreement    Self-health management of chronic disease state of  CHF: --  endorses eating "no salt" "heart healthy" diet -- denies issues with breathing/ shortness of breath/ significant LE swelling -- confirms that she is monitoring and recording daily weights, BP's, and HR values:  Reports weight this morning 125.6 lbs, with daily weight ranges between 124-126 lbs consistently; positive reinforcement provided for monitoring and recording daily weights-- patient encouraged to continue this practice, and she agrees  Patient denies further issues, concerns, or problems today.  I provided/ confirmed that patient has my direct phone number, the main [facility] CM office phone number, and the [facility] CM 24-hour nurse advice phone number should issues arise prior to next scheduled [place] outreach, and we scheduled [facility] CCM RN initial home visit for next week.  Encouraged patient to contact me directly if needs, questions, issues, or concerns arise prior to next scheduled outreach; patient agreed to do so.  Plan:  Patient will take medications as prescribed and will attend all scheduled provider appointments post-hospital discharge  Patient will continue actively participating in home health services as ordered post-hospital discharge  Patient will continue monitoring and recording daily weights  Patient will promptly notify care providers for any new concerns/ issues/ problems that arise  I will make patient's PCP aware of [facility] CM involvement in her care post-hospital discharge- will send PCP [place] letter  [place] RN CM outreach to continue with scheduled initial home visit next week  [facility] CM Care Plan Problem One     Most Recent Value  Care Plan Problem One  High risk for readmission as evidence by recent hosptal readmission for CHF exacerbation post initial hospitalization for surgical lap/chole.  Role Documenting the Problem One  Care Management Coordinator  Care Plan for Problem One  Active  [facility] Long Term Goal   Over the next 31 days, patient  will not experience hospital readmission, as evidenced by patient reporting and review of EMR during [facility] RN CCM outreach  [place] Term Goal Start Date  03/20/18  Interventions for Problem One Long Term Goal  Discussed with patient current clinical condition/ status, recent and upcoming provider appointments and ensured that patient has transportation to provider appointments,  scheduled initial [facility] RN CCM home visit for next week  [facility] CM Short Term Goal #1   Over the next 30 days, patient will attend follow up provider appointments as scheduled, as evidenced by patient reporting, collaboration with providers as indicated, and review of EMR during [facility] RN CCM outreach  [facility] CM Short Term Goal #1 Start Date  03/20/18  Interventions for Short Term Goal #1  Reviewed upcoming provider appointments with patient and ensured that patient has established transportation to all scheduled appointments  [facility] CM Short Term Goal #2   Over the next 30 days, patient will actively participate in home health services as ordered post-hospital discharge, as evidenced by patient reporting and collaboration with [facility] agency team as indicated  [facility] CM Short Term Goal #2 Start Date  03/20/18  Interventions for Short Term Goal #2  Confirmed with patient that she is actively participating in [facility] services as ordered post-hospital discharge and reviewed recent [facility] visits with patient,  confirmed that patient has phone number for [facility] team     Oneta Rack, RN, BSN, [place] Care Management  [phone]

## 2018-03-27 DIAGNOSIS — I10 Essential (primary) hypertension: Secondary | ICD-10-CM | POA: Diagnosis not present

## 2018-03-27 DIAGNOSIS — E7800 Pure hypercholesterolemia, unspecified: Secondary | ICD-10-CM | POA: Diagnosis not present

## 2018-03-27 DIAGNOSIS — Z7984 Long term (current) use of oral hypoglycemic drugs: Secondary | ICD-10-CM | POA: Diagnosis not present

## 2018-03-27 DIAGNOSIS — K8020 Calculus of gallbladder without cholecystitis without obstruction: Secondary | ICD-10-CM | POA: Diagnosis not present

## 2018-03-27 DIAGNOSIS — D649 Anemia, unspecified: Secondary | ICD-10-CM | POA: Diagnosis not present

## 2018-03-27 DIAGNOSIS — Z952 Presence of prosthetic heart valve: Secondary | ICD-10-CM | POA: Diagnosis not present

## 2018-03-27 DIAGNOSIS — E1151 Type 2 diabetes mellitus with diabetic peripheral angiopathy without gangrene: Secondary | ICD-10-CM | POA: Diagnosis not present

## 2018-03-27 DIAGNOSIS — I4891 Unspecified atrial fibrillation: Secondary | ICD-10-CM | POA: Diagnosis not present

## 2018-03-27 DIAGNOSIS — I2510 Atherosclerotic heart disease of native coronary artery without angina pectoris: Secondary | ICD-10-CM | POA: Diagnosis not present

## 2018-03-29 DIAGNOSIS — I4891 Unspecified atrial fibrillation: Secondary | ICD-10-CM | POA: Diagnosis not present

## 2018-03-29 DIAGNOSIS — E7800 Pure hypercholesterolemia, unspecified: Secondary | ICD-10-CM | POA: Diagnosis not present

## 2018-03-29 DIAGNOSIS — I10 Essential (primary) hypertension: Secondary | ICD-10-CM | POA: Diagnosis not present

## 2018-03-29 DIAGNOSIS — Z952 Presence of prosthetic heart valve: Secondary | ICD-10-CM | POA: Diagnosis not present

## 2018-03-29 DIAGNOSIS — D649 Anemia, unspecified: Secondary | ICD-10-CM | POA: Diagnosis not present

## 2018-03-29 DIAGNOSIS — Z7984 Long term (current) use of oral hypoglycemic drugs: Secondary | ICD-10-CM | POA: Diagnosis not present

## 2018-03-29 DIAGNOSIS — I2510 Atherosclerotic heart disease of native coronary artery without angina pectoris: Secondary | ICD-10-CM | POA: Diagnosis not present

## 2018-03-29 DIAGNOSIS — E1151 Type 2 diabetes mellitus with diabetic peripheral angiopathy without gangrene: Secondary | ICD-10-CM | POA: Diagnosis not present

## 2018-03-29 DIAGNOSIS — K8020 Calculus of gallbladder without cholecystitis without obstruction: Secondary | ICD-10-CM | POA: Diagnosis not present

## 2018-04-01 ENCOUNTER — Ambulatory Visit (INDEPENDENT_AMBULATORY_CARE_PROVIDER_SITE_OTHER): Payer: Medicare HMO | Admitting: Pharmacist

## 2018-04-01 DIAGNOSIS — I4891 Unspecified atrial fibrillation: Secondary | ICD-10-CM | POA: Diagnosis not present

## 2018-04-01 DIAGNOSIS — Z7901 Long term (current) use of anticoagulants: Secondary | ICD-10-CM

## 2018-04-01 DIAGNOSIS — E7800 Pure hypercholesterolemia, unspecified: Secondary | ICD-10-CM | POA: Diagnosis not present

## 2018-04-01 DIAGNOSIS — I2510 Atherosclerotic heart disease of native coronary artery without angina pectoris: Secondary | ICD-10-CM | POA: Diagnosis not present

## 2018-04-01 DIAGNOSIS — I10 Essential (primary) hypertension: Secondary | ICD-10-CM | POA: Diagnosis not present

## 2018-04-01 DIAGNOSIS — K8020 Calculus of gallbladder without cholecystitis without obstruction: Secondary | ICD-10-CM | POA: Diagnosis not present

## 2018-04-01 DIAGNOSIS — E1151 Type 2 diabetes mellitus with diabetic peripheral angiopathy without gangrene: Secondary | ICD-10-CM | POA: Diagnosis not present

## 2018-04-01 DIAGNOSIS — Z952 Presence of prosthetic heart valve: Secondary | ICD-10-CM | POA: Diagnosis not present

## 2018-04-01 DIAGNOSIS — D649 Anemia, unspecified: Secondary | ICD-10-CM | POA: Diagnosis not present

## 2018-04-01 DIAGNOSIS — Z7984 Long term (current) use of oral hypoglycemic drugs: Secondary | ICD-10-CM | POA: Diagnosis not present

## 2018-04-01 LAB — POCT INR: INR: 1.5 — AB (ref 2.0–3.0)

## 2018-04-02 DIAGNOSIS — E1151 Type 2 diabetes mellitus with diabetic peripheral angiopathy without gangrene: Secondary | ICD-10-CM | POA: Diagnosis not present

## 2018-04-02 DIAGNOSIS — E871 Hypo-osmolality and hyponatremia: Secondary | ICD-10-CM | POA: Diagnosis not present

## 2018-04-02 DIAGNOSIS — I480 Paroxysmal atrial fibrillation: Secondary | ICD-10-CM | POA: Diagnosis not present

## 2018-04-02 DIAGNOSIS — Z6822 Body mass index (BMI) 22.0-22.9, adult: Secondary | ICD-10-CM | POA: Diagnosis not present

## 2018-04-02 DIAGNOSIS — D638 Anemia in other chronic diseases classified elsewhere: Secondary | ICD-10-CM | POA: Diagnosis not present

## 2018-04-02 DIAGNOSIS — J9601 Acute respiratory failure with hypoxia: Secondary | ICD-10-CM | POA: Diagnosis not present

## 2018-04-02 DIAGNOSIS — R0602 Shortness of breath: Secondary | ICD-10-CM | POA: Diagnosis not present

## 2018-04-02 DIAGNOSIS — E440 Moderate protein-calorie malnutrition: Secondary | ICD-10-CM | POA: Diagnosis not present

## 2018-04-02 DIAGNOSIS — I10 Essential (primary) hypertension: Secondary | ICD-10-CM | POA: Diagnosis not present

## 2018-04-03 DIAGNOSIS — Z7984 Long term (current) use of oral hypoglycemic drugs: Secondary | ICD-10-CM | POA: Diagnosis not present

## 2018-04-03 DIAGNOSIS — E7800 Pure hypercholesterolemia, unspecified: Secondary | ICD-10-CM | POA: Diagnosis not present

## 2018-04-03 DIAGNOSIS — I2510 Atherosclerotic heart disease of native coronary artery without angina pectoris: Secondary | ICD-10-CM | POA: Diagnosis not present

## 2018-04-03 DIAGNOSIS — D649 Anemia, unspecified: Secondary | ICD-10-CM | POA: Diagnosis not present

## 2018-04-03 DIAGNOSIS — K8020 Calculus of gallbladder without cholecystitis without obstruction: Secondary | ICD-10-CM | POA: Diagnosis not present

## 2018-04-03 DIAGNOSIS — E1151 Type 2 diabetes mellitus with diabetic peripheral angiopathy without gangrene: Secondary | ICD-10-CM | POA: Diagnosis not present

## 2018-04-03 DIAGNOSIS — I4891 Unspecified atrial fibrillation: Secondary | ICD-10-CM | POA: Diagnosis not present

## 2018-04-03 DIAGNOSIS — I10 Essential (primary) hypertension: Secondary | ICD-10-CM | POA: Diagnosis not present

## 2018-04-03 DIAGNOSIS — Z952 Presence of prosthetic heart valve: Secondary | ICD-10-CM | POA: Diagnosis not present

## 2018-04-04 ENCOUNTER — Encounter: Payer: Self-pay | Admitting: *Deleted

## 2018-04-04 ENCOUNTER — Other Ambulatory Visit: Payer: Self-pay | Admitting: *Deleted

## 2018-04-04 DIAGNOSIS — I2510 Atherosclerotic heart disease of native coronary artery without angina pectoris: Secondary | ICD-10-CM | POA: Diagnosis not present

## 2018-04-04 DIAGNOSIS — Z7984 Long term (current) use of oral hypoglycemic drugs: Secondary | ICD-10-CM | POA: Diagnosis not present

## 2018-04-04 DIAGNOSIS — D649 Anemia, unspecified: Secondary | ICD-10-CM | POA: Diagnosis not present

## 2018-04-04 DIAGNOSIS — E7800 Pure hypercholesterolemia, unspecified: Secondary | ICD-10-CM | POA: Diagnosis not present

## 2018-04-04 DIAGNOSIS — K8020 Calculus of gallbladder without cholecystitis without obstruction: Secondary | ICD-10-CM | POA: Diagnosis not present

## 2018-04-04 DIAGNOSIS — I10 Essential (primary) hypertension: Secondary | ICD-10-CM | POA: Diagnosis not present

## 2018-04-04 DIAGNOSIS — I4891 Unspecified atrial fibrillation: Secondary | ICD-10-CM | POA: Diagnosis not present

## 2018-04-04 DIAGNOSIS — Z952 Presence of prosthetic heart valve: Secondary | ICD-10-CM | POA: Diagnosis not present

## 2018-04-04 DIAGNOSIS — E1151 Type 2 diabetes mellitus with diabetic peripheral angiopathy without gangrene: Secondary | ICD-10-CM | POA: Diagnosis not present

## 2018-04-04 NOTE — Patient Outreach (Signed)
[place] [facility]) Care Management  [place] Initial Home Visit Post-hospital discharge day [name] x 1 04/04/2018  [name] 1941/09/17 [id number]  [name] is a 77 y.o. female referred to [place] after recent hospitalization May 16-28, 2019 for abdominal pain, nausea/ vomiting/ diarrhea; patient had lap chole on Mar 01, 2018 and was discharged home to self-care with home health services for RN/ PT through [place]. Patient has history including, but not limited to, HTN/ HLD; DM; GERD; A-fib; AVR on ACT; PVD; CHF; CAD; and anxiety.Unfortunately, patient experienced hospital re-admission one day after previous hospital discharge on May 29-March 13, 2018 for shortness of breath, hypoxia, pulmonary edema consistent with dCHF exacerbation. Patient was againdischarged home to self-care with [facility] and [facility].   Care Coordination Outreach: As I arrived at patient's home, home health PT "[name]" is just leaving; [name] reports that patient has been progressing well with home health services and PT and has actively participated in all services; reports that patient made her aware of a newly developed rash on her lower extremities, and confirms that she has contacted patient's PCP as an [place].  I provided my business card to [facility] for further care coordination outreach, if/ as indicated  HIPAA/ identity verified with patient in person during home visit today; [name], [place] Director, is also present for home visit; and patient's neighbor briefly present at end of visit.  [place] services were reviewed with patient in person today; pleasant 90 minute home visit.  Today, patient reports that she "is doing pretty good;" states that she "woke up with new (R) leg pain" from "overdoing with daughter" yesterday; states they "went out to eat" and  states that she was more active than usual; patient reports pain "better" after taking tylenol this morning.  Patient is in no obvious distress throughout entirety of home visit today, and she denies new/ recent falls.  Reports using walker "pretty much all the time" post-most recent hospital discharge.  Subjective: "I think I have gotten much better over the last few weeks; I don't know if I could have come this far without my family and so many good friends and neighbors checking on me."  Assessment:  Mrs. Boltz appears to be recuperating well after her recent hospitalizations.  She has a supportive network of family and friends that assist in her care needs as indicated.  She verbalizes a good understanding of her medications, which she self- manages.  Patient is adherent to overall plan of care and committed to attending all scheduled provider office visits.  Mrs. Forinash could benefit from ongoing reinforcement/ education around chronic disease state of CHF.  Patient further reports:  Medications: -- Has all medicationsand takes as prescribed;denies questions/ concerns around current medications.  -- self-manage medications-- uses one-slot pill box for entire week despite that she takes medications throughout the day; patient verbalizes very good understanding of general purpose, dosing, and scheduling of her medications and demonstrates her current process for managing medication, which at first appears to be confusing, but does seem to work for patient; when questioned as to why some of her pill box slots had discrepancies with pills remaining in what should be empty slots, patient states that she must have just added "an extra" pill by mistake.  Patient is able to identify these medications, and states that she is "very familiar" with her  medications, and "always catches any errors" she accidentally makes with weekly filling of pill box, which is verified by review of her pill box.  I provided  patient with 4-slot weekly pill box, and discussed it's use with her; I offered to assist patient in filling this new planner, but she declines, stating that she would rather do later today.  -- noted that patient does provide inconsistent reporting of coumadin dosing from earlier this week-- patient reports that home health nurse checked her INR on Monday 04/01/18, and that she was "following the instructions of the nurse."  Patient verbalized that she "thought" nurse told her to take "one pill (5 mg) every day after Monday 04/01/18."  Together we reviewed the new orders from 04/01/18, and I wrote the prescribed dosing down on her calendar for her reference.  Patient reports that she has been on ACT/ Coumadin for "10 years" and she verbalizes a good understanding of purpose of ACT, as well as dietary and fall considerations -- Patient was recently discharged from hospital and all medications were thoroughly reviewed with her today  Home health [facility]) services: -- [place] services for PT, OT, RN in place through [facility] for PT and RN -- confirms that she is actively participating in all [facility] services   Provider appointments: -- All recent and upcoming provider appointments were reviewed with patient today- patient verbalizes plans to attend all scheduled provider appointments, stating her son will transport her, "as he always does" -- PCP appointment scheduled for Monday 04/08/18-- assisted patient in creating list of items to discuss with PCP post-hospital discharge  Safety/ Mobility/ Falls: -- As noted above, using walker: demonstrates steady, purposeful gait in ambulation around home using walker -- no obvious fall risks/ hazards noted in patient's environment-- house is clutter free throughout  [employer] needs: -- again denies community resource needs, stating supportive family members and friends that assist with care needs as indicated -- friend/ neighbor continues temporarily  staying overnight with her since her last hospital discharge to assist with needs -- family (son) continues providing transportation for patient to all provider appointments, errands, etc-- patient states she hopes to eventually resume driving as her condition improves post-hospital discharge  Self-health management of chronic disease state of CHF: -- endorses eating "no salt" "heart healthy" diet -- denies issues with breathing/ shortness of breath/ significant LE swelling: patient on room air without supplemental O2 -- monitoring and recording daily weights, BP's, and HR values:  Reports weight this morning 126 lbs, with daily weight ranges between 124-126 lbs consistently; positive reinforcement provided for monitoring and recording daily weights-- patient encouraged to continue this practice, and she agrees to do so-- discussed value of ongoing weight monitoring/ and recording, even after patient starts to feel better -- printed EMMI educational material provided and reviewed with patient around self-health management of chronic disease state of CHF using teach back method  Patient denies further issues, concerns, or problems today. I provided/ confirmed that patient hasmy direct phone number, the main [facility] CM office phone number, and the [facility] CM 24-hour nurse advice phone number should issues arise prior to next scheduled [place] outreach by telephone within next 10 days.  Encouraged patient to contact me directly if needs, questions, issues, or concerns arise prior to next scheduled outreach; patient agreed to do so.  Objective:   BP (!) 126/58   Pulse (!) 58   Resp 18   Wt 126 lb (57.2 kg)   SpO2 98%  BMI 23.81 kg/m   Review of Systems  Constitutional: Positive for malaise/fatigue.  HENT: Positive for hearing loss.        Reports decreased hearing in (R) ear "due to inner ear problem back in 2013;" patient was encouraged to share with PCP to arrange possible hearing  evaluation; patient states she will consider doing so, stating that she "is not bothered" by this, as it has "been going on for so long."  Patient encouraged to contact health insurance provider to follow up on coverage benefits for hearing evaluation/ possible hearing aids  Eyes: Negative.   Respiratory: Negative.  Negative for cough, shortness of breath and wheezing.   Cardiovascular: Negative.  Negative for chest pain, palpitations, orthopnea and leg swelling.  Gastrointestinal: Negative.  Negative for abdominal pain and nausea.  Genitourinary: Positive for frequency and urgency.       Reports occasional urgency/ frequency-- on Diuretic therapy  Musculoskeletal: Positive for joint pain. Negative for falls and myalgias.       Reports one day history of (R) leg pain, which she attributes to "overdoing yesterday" when she went out to eat and run errands with her daughter- declines putting numerical value on pain; states "it's not too bad;" reports "taking tylenol this morning," which stated "helped"  Skin: Positive for rash. Negative for itching.       Non-raised/ flat petechial rash present on patient's lower legs bilaterally; patient denies itching, but states that she has been treating with hydrocortisone, "which maybe is helping;" patient reports rash was first noticed by her "a couple of days ago"  Encouraged patient to show PCP at time of upcoming scheduled office visit on Monday 04/08/18  Neurological: Negative for dizziness.  Psychiatric/Behavioral: Negative for depression. The patient is not nervous/anxious.    Physical Exam  Constitutional: She is oriented to person, place, and time. She appears well-developed and well-nourished. No distress.  Cardiovascular: Normal rate, regular rhythm, normal heart sounds and intact distal pulses.  Pulses:      Radial pulses are 2+ on the right side, and 2+ on the left side.       Dorsalis pedis pulses are 2+ on the right side, and 2+ on the left side.   Respiratory: Breath sounds normal. No respiratory distress. She has no wheezes. She has no rales.  GI: Soft. Bowel sounds are normal.  Musculoskeletal: She exhibits no edema.  Neurological: She is alert and oriented to person, place, and time.  Skin: Skin is warm and dry. No erythema.  Psychiatric: She has a normal mood and affect. Her behavior is normal. Judgment and thought content normal.   Encounter Medications:   Outpatient Encounter Medications as of 04/04/2018  Medication Sig Note  . acetaminophen (TYLENOL) 325 MG tablet Take 2 tablets (650 mg total) by mouth every 6 (six) hours as needed for mild pain, fever or headache (for pain or headaches).   . ALPRAZolam (XANAX) 0.5 MG tablet Take 0.25-0.5 mg by mouth See admin instructions. Take 0.25-0.5 mg two times a day as needed for anxiety and 0.25-0.5 mg at bedtime scheduled   . atorvastatin (LIPITOR) 80 MG tablet Take 1 tablet (80 mg total) by mouth daily. (Patient taking differently: Take 80 mg by mouth daily with supper. )   . Cholecalciferol (VITAMIN D3) 1000 UNITS CAPS Take 1,000 Units by mouth daily.    . ferrous sulfate 325 (65 FE) MG tablet Take 1 tablet (325 mg total) by mouth 2 (two) times daily with a meal.   .  furosemide (LASIX) 20 MG tablet Take 4 tablets (80 mg total) by mouth daily.   [name] glipiZIDE (GLUCOTROL XL) 2.5 MG 24 hr tablet Take 2.5 mg by mouth daily.    . hydrALAZINE (APRESOLINE) 100 MG tablet Take 100 mg by mouth 2 (two) times daily. Take one-half (50 mg po) BID   . hydrocortisone cream 1 % Apply topically 2 (two) times daily.   . irbesartan (AVAPRO) 300 MG tablet Take 1 tablet (300 mg total) by mouth daily.   . magnesium oxide (MAG-OX) 400 (241.3 Mg) MG tablet Take 1 tablet (400 mg total) by mouth daily.   . metFORMIN (GLUCOPHAGE) 500 MG tablet Take 500 mg by mouth 2 (two) times daily with a meal.    . metoprolol tartrate (LOPRESSOR) 50 MG tablet Take 50 mg by mouth 2 (two) times daily.   . ondansetron (ZOFRAN ODT)  4 MG disintegrating tablet 4mg  ODT q6-8 hours prn nausea/vomit   . pantoprazole (PROTONIX) 40 MG tablet Take 40 mg by mouth daily.    . traMADol (ULTRAM) 50 MG tablet Take 1 tablet (50 mg total) by mouth every 6 (six) hours as needed for moderate pain.   . vitamin B-12 (CYANOCOBALAMIN) 100 MCG tablet Take 100 mcg by mouth daily.    [name] warfarin (COUMADIN) 5 MG tablet Take 1 tablet (5 mg total) by mouth daily at 6 PM.   . Amino Acids-Protein Hydrolys (FEEDING SUPPLEMENT, PRO-STAT SUGAR FREE 64,) LIQD Take 30 mLs by mouth 2 (two) times daily. (Patient not taking: Reported on 04/04/2018)   . Polyethyl Glyc-Propyl Glyc PF (SYSTANE PRESERVATIVE FREE) 0.4-0.3 % SOLN Place 1-2 drops into both eyes 3 (three) times daily as needed (for irritation).    . potassium chloride SA (K-DUR,KLOR-CON) 20 MEQ tablet Take 1 tablet (20 mEq total) by mouth daily. (Patient not taking: Reported on 04/04/2018) 04/04/2018: Patient reports was recently discontinued during PCP office visit  . saccharomyces boulardii (FLORASTOR) 250 MG capsule Take 1 capsule (250 mg total) by mouth 2 (two) times daily. (Patient not taking: Reported on 04/04/2018) 04/04/2018: Patient reports completed doses as prescribed   No facility-administered encounter medications on file as of 04/04/2018.    Functional Status:   In your present state of health, do you have any difficulty performing the following activities: 04/04/2018 03/26/2018  Hearing? Y -  Comment see ROS-- reports decreased hearing in (R) ear due to hx "inner ear in 2013" -  Vision? N -  Difficulty concentrating or making decisions? N N  Walking or climbing stairs? - Y  Dressing or bathing? - N  Doing errands, shopping? Y Y  Comment Family assists with transportation to provider appointments/ errands patient reports today, not currently driving until she recuperates more after recent hospitalizations  Preparing Food and eating ? - N  Using the Toilet? - N  In the past six months, have you  accidently leaked urine? - N  Do you have problems with loss of bowel control? - N  Managing your Medications? - N  Managing your Finances? - Y  Comment - Family assists per patient report today  Housekeeping or managing your Housekeeping? - Y  Comment - Family assists per patient report today  Some recent data might be hidden   Fall/Depression Screening:    Fall Risk  04/04/2018 03/26/2018  Falls in the past year? No No  Risk for fall due to : Other (Comment) Impaired balance/gait  Risk for fall due to: Comment fatigue-- basic falls eval completed;  falls risks/ prevention education provided weakness from recent hospitalizations, per patient report today   PHQ 2/9 Scores 04/04/2018  PHQ - 2 Score 0   Plan:  Patient will take medications as prescribed and will attend all scheduled provider appointments post-hospital discharge  Patient will continue actively participating in home health services as ordered post-hospital discharge  Patient will continue monitoring and recording daily weights and will review educational material provided to her today on CHF zones/ action plans  Patient will promptly notify care providers for any new concerns/ issues/ problems that arise  I will share today's notes and care plan with patient's PCP   [facility] RN CM outreach to continue with scheduled phone call next week  [facility] CM Care Plan Problem One     Most Recent Value  Care Plan Problem One  High risk for readmission as evidenced by recent hosptal readmission for CHF exacerbation post initial hospitalization for surgical lap/chole.  Role Documenting the Problem One  Care Management Coordinator  Care Plan for Problem One  Active  [facility] Long Term Goal   Over the next 31 days, patient will not experience hospital readmission, as evidenced by patient reporting and review of EMR during [facility] RN CCM outreach  [place] Term Goal Start Date  03/20/18  Interventions for Problem One Long Term Goal   [facility] CM initial home visit completed with physical assessment, fall evaluation and medication review completed  [facility] CM Short Term Goal #1   Over the next 30 days, patient will attend follow up provider appointments as scheduled, as evidenced by patient reporting, collaboration with providers as indicated, and review of EMR during [facility] RN CCM outreach  [facility] CM Short Term Goal #1 Start Date  03/20/18  Interventions for Short Term Goal #1  Reviewed recent and upcoming provider appointments with patient and confirmed that family will continue transporting patient to all provider appointments,  developed list of topics to discuss with her PCP at next scheduled office visit on Monday 04/08/18  [facility] CM Short Term Goal #2   Over the next 30 days, patient will actively participate in home health services as ordered post-hospital discharge, as evidenced by patient reporting and collaboration with [facility] agency team as indicated  [facility] CM Short Term Goal #2 Start Date  03/20/18  Interventions for Short Term Goal #2  Discussed patient's progress with both herself and with [facility] PT,  confirmed that [facility] PT plans to contact patient's PCP regarding newly reported rash on patient's lower extremities,  provided my contact information to [facility] PT for futher care coordination outreach, as indicated,  encouraged patient to continue actively participating in all [facility] sevrices as ordered post-hospital discharge    [facility] CM Care Plan Problem Two     Most Recent Value  Care Plan Problem Two  Knowledge deficit around self-health management strategies of chronic disease state of CHF, as evidenced by patient reporting and new diagnosis of CHF  Role Documenting the Problem Two  Care Management Coordinator  Care Plan for Problem Two  Active  Interventions for Problem Two Long Term Goal   Using teach back method, provided and thoroughly reviewed with patient printed educational material regarding CHF zones and corresponding action plans, with  focus on yellow zone,  encouraged patient to review this material regularly  [facility] Long Term Goal  Over the next 50 days, patient will be able to verbalize signs/ symptoms yellow CHF zone, along with corresponding action plan, as evidenced by patient reporting  during [place] outreach  [place] Term Goal Start Date  04/04/18  [facility] CM Short Term Goal #1   Over the next 30 days, patient will continue monitoring and recording daily weights, as evidenced by review of same during [place] outreach  [facility] CM Short Term Goal #1 Start Date  04/04/18  Interventions for Short Term Goal #2   Discussed with and provided printed educational material around value of/ importance of daily weight monitoring in setting of CHF-- encouraged patient to make daily weight monitoring/ recording a part of her daily routine,  discussed weight gain guidelines to report to care providers for CHF management     I appreciate the opportunity to participate in Mrs. Wonnacott's care,  Oneta Rack, RN, BSN, [employer] Coordinator [facility] Care Management  [phone]

## 2018-04-05 ENCOUNTER — Encounter: Payer: Self-pay | Admitting: Podiatry

## 2018-04-05 ENCOUNTER — Ambulatory Visit: Payer: Medicare HMO | Admitting: Podiatry

## 2018-04-05 DIAGNOSIS — B351 Tinea unguium: Secondary | ICD-10-CM

## 2018-04-05 DIAGNOSIS — M79675 Pain in left toe(s): Secondary | ICD-10-CM

## 2018-04-05 DIAGNOSIS — M79674 Pain in right toe(s): Secondary | ICD-10-CM | POA: Diagnosis not present

## 2018-04-05 DIAGNOSIS — Z9229 Personal history of other drug therapy: Secondary | ICD-10-CM

## 2018-04-06 NOTE — Progress Notes (Signed)
Subjective: [name] is a 77 yo WF who presents today to clinic. She is on long term use of blood thinner (Coumadin) with cc of painful, discolored, thick toenails which interfere with activities of daily living. Pain is aggravated when wearing enclosed shoe gear. Pain is getting progressively worse and relieved with periodic professional debridement.  Objective: There were no vitals filed for this visit. Vascular Examination: Capillary refill time <3 seconds x 10 digits Dorsalis pedis pulses and Posterior tibial pulses present b/l No digital hair x 10 digits Skin temperature WNL b/l  Dermatological Examination: Turgor, texture and tone normal b/l LE Toenails 1-5 b/l discolored, thick, dystrophic with subungual debris and pain with palpation to nailbeds due to thickness of nails.  Musculoskeletal: Muscle strength 5/5 to all LE muscle groups  Neurological: Sensation intact with 10 gram monofilament. Vibratory sensation intact.  Assessment: Painful onychomycosis toenails 1-5 b/l in patient on long term blood thinner.   Plan: 1. Toenails 1-5 b/l were debrided in length and girth without iatrogenic bleeding. 2. Patient to continue soft, supportive shoe gear 3. Patient to report any pedal injuries to medical professional immediately. 4. Avoid self trimming due to use of blood thinner. 5. Follow up 3 months.  6. Patient/POA to call should there be a concern in the interim.

## 2018-04-07 DIAGNOSIS — I482 Chronic atrial fibrillation, unspecified: Secondary | ICD-10-CM | POA: Diagnosis not present

## 2018-04-07 DIAGNOSIS — I700 Atherosclerosis of aorta: Secondary | ICD-10-CM | POA: Diagnosis not present

## 2018-04-07 DIAGNOSIS — I2510 Atherosclerotic heart disease of native coronary artery without angina pectoris: Secondary | ICD-10-CM | POA: Diagnosis not present

## 2018-04-07 DIAGNOSIS — E1151 Type 2 diabetes mellitus with diabetic peripheral angiopathy without gangrene: Secondary | ICD-10-CM | POA: Diagnosis not present

## 2018-04-07 DIAGNOSIS — I5032 Chronic diastolic (congestive) heart failure: Secondary | ICD-10-CM | POA: Diagnosis not present

## 2018-04-07 DIAGNOSIS — I110 Hypertensive heart disease with heart failure: Secondary | ICD-10-CM | POA: Diagnosis not present

## 2018-04-07 DIAGNOSIS — D508 Other iron deficiency anemias: Secondary | ICD-10-CM | POA: Diagnosis not present

## 2018-04-07 DIAGNOSIS — I2789 Other specified pulmonary heart diseases: Secondary | ICD-10-CM | POA: Diagnosis not present

## 2018-04-07 DIAGNOSIS — E7800 Pure hypercholesterolemia, unspecified: Secondary | ICD-10-CM | POA: Diagnosis not present

## 2018-04-08 DIAGNOSIS — I10 Essential (primary) hypertension: Secondary | ICD-10-CM | POA: Diagnosis not present

## 2018-04-08 DIAGNOSIS — E1151 Type 2 diabetes mellitus with diabetic peripheral angiopathy without gangrene: Secondary | ICD-10-CM | POA: Diagnosis not present

## 2018-04-08 DIAGNOSIS — I5032 Chronic diastolic (congestive) heart failure: Secondary | ICD-10-CM | POA: Diagnosis not present

## 2018-04-08 DIAGNOSIS — Z7901 Long term (current) use of anticoagulants: Secondary | ICD-10-CM | POA: Diagnosis not present

## 2018-04-08 DIAGNOSIS — Z6823 Body mass index (BMI) 23.0-23.9, adult: Secondary | ICD-10-CM | POA: Diagnosis not present

## 2018-04-08 DIAGNOSIS — I480 Paroxysmal atrial fibrillation: Secondary | ICD-10-CM | POA: Diagnosis not present

## 2018-04-10 ENCOUNTER — Other Ambulatory Visit: Payer: Self-pay | Admitting: *Deleted

## 2018-04-10 ENCOUNTER — Encounter: Payer: Self-pay | Admitting: *Deleted

## 2018-04-10 DIAGNOSIS — I272 Pulmonary hypertension, unspecified: Secondary | ICD-10-CM | POA: Diagnosis not present

## 2018-04-10 DIAGNOSIS — I5032 Chronic diastolic (congestive) heart failure: Secondary | ICD-10-CM | POA: Diagnosis not present

## 2018-04-10 DIAGNOSIS — D509 Iron deficiency anemia, unspecified: Secondary | ICD-10-CM | POA: Diagnosis not present

## 2018-04-10 DIAGNOSIS — I482 Chronic atrial fibrillation: Secondary | ICD-10-CM | POA: Diagnosis not present

## 2018-04-10 DIAGNOSIS — I2510 Atherosclerotic heart disease of native coronary artery without angina pectoris: Secondary | ICD-10-CM | POA: Diagnosis not present

## 2018-04-10 DIAGNOSIS — E1151 Type 2 diabetes mellitus with diabetic peripheral angiopathy without gangrene: Secondary | ICD-10-CM | POA: Diagnosis not present

## 2018-04-10 DIAGNOSIS — E7800 Pure hypercholesterolemia, unspecified: Secondary | ICD-10-CM | POA: Diagnosis not present

## 2018-04-10 DIAGNOSIS — I110 Hypertensive heart disease with heart failure: Secondary | ICD-10-CM | POA: Diagnosis not present

## 2018-04-10 DIAGNOSIS — I700 Atherosclerosis of aorta: Secondary | ICD-10-CM | POA: Diagnosis not present

## 2018-04-10 NOTE — Patient Outreach (Addendum)
[facility]) Care Management [place] Telephone Outreach Post-hospital discharge day 28  04/10/2018  [name] 1941-10-01 [id number]  Successful telephone outreach to [name], 77 y.o. female referred to [place] after recent hospitalization May 16-28, 2019 for abdominal pain, nausea/ vomiting/ diarrhea; patient hadlap chole on Mar 01, 2018 and was discharged home to self-care with home health services for RN/ PT through [place]. Patient has history including, but not limited to, HTN/ HLD; DM; GERD; A-fib; AVR on ACT; PVD; CHF; CAD; and anxiety.Unfortunately, patient experienced hospital re-admission one day after previous hospital discharge on May 29-March 13, 2018 for shortness of breath, hypoxia, pulmonary edema consistent with dCHF exacerbation. Patient was againdischarged home to self-care with home health services in place through [facility] agencyfor nursing and PT. HIPAA/ identity verified during phone call today.  Today, patient reports she "is doing just fine;" and states that she is having her "normal" knee pain which is relieved with prescribed medications; patient reports ongoing "gout flare" which she states has "improved from last week."  Patient denies new/ recent falls, and reports continues using walker "all the time." States that recently reported rash on (R) LE has now "resolved" "on it's own."  Patient further reports:  Medications: -- Has all medicationsand takes as prescribed;denies questions/ concerns around current medications.  -- self-manage medications-- using new multi-slot weekly pill planner box, provided to her during [place] home visit on 04/04/18; states she "is getting used to it," and believes she is taking her medications "the right way." -- able to verbalize current coumadin dosing; reports home health nurse continues monitoring INR -- states that her glipizide for DM has  "been discontinued" after recent PCP office visit 04/08/18: stated that he A1-C on 04/08/18 was "5.5" and that this medication was therefore discontinued -- states she received confirmation from PCP that she does not need to take supplemental potassium any longer  Home health [facility]) services: -- [place] [facility] for PT, OT, [facility]- reports visits going well; RN visited today; PT expected "later this afternoon" -- confirms that she is actively participating in all [facility] services   Provider appointments: -- [place] provider appointments were reviewed with patient today- patient verbalizes plans to attend all scheduled provider appointments, stating her son will continue to transport her: cardiology appointment 04/19/18 -- PCP appointment scheduled for Monday 04/08/18-- stated "got a good report" and changes as above were made to medications  Self-health management ofchronic disease state of CHF: -- denies issues with breathing/ shortness of breath/ significant LE swelling -- monitoring and recording daily weights, BP's, and HR values: Reports weight this morning 125 lbs, with daily weight ranges between 124-126 lbs consistently; positive reinforcement provided for monitoring and recording daily weights-- patient encouraged to continue this practice, and she agrees to do so-- discussed value of ongoing weight monitoring/ and recording, even after patient starts to feel better -- reviewed with patient previously provided printed EMMI educational material around self-health management of chronic disease state of CHF -- reports in "green CHF zone" today; able to verbalize signs/ symptoms/ corresponding action plans for green/ yellow CHF zones, along with weight gain guidelines in setting of CHF  Patient denies further issues, concerns, or problems today. Iconfirmed that patient hasmy direct phone number, the main [facility] CM office phone number, and the [facility] CM  24-hour nurse advice phone number should issues arise prior to next scheduled [facility]  Community CM outreach by telephone within next 10 days. Encouraged patient to contact me directly if needs, questions, issues, or concerns arise prior to next scheduled outreach; patient agreed to do so.  Plan:  Patient will take medications as prescribed and will attend all scheduled provider appointments post-hospital discharge  Patient will continue actively participating in home health services as ordered post-hospital discharge  Patient will continue monitoring and recording daily weights and will review educational material previously provided to her on CHF zones/ action plans  Patient will promptly notify care providers for any new concerns/ issues/ problems that arise  [facility] Community RN CM outreach to continue with scheduled phone call post-scheduled cardiology appointment  [facility] CM Care Plan Problem One     Most Recent Value  Care Plan Problem One  High risk for readmission as evidenced by recent hosptal readmission for CHF exacerbation post initial hospitalization for surgical lap/chole.  Role Documenting the Problem One  Care Management Coordinator  Care Plan for Problem One  Active  [facility] Long Term Goal   Over the next 31 days, patient will not experience hospital readmission, as evidenced by patient reporting and review of EMR during [facility] RN CCM outreach  [place] Term Goal Start Date  03/20/18  Interventions for Problem One Long Term Goal  Discussed with patient currentl clinical condition, confirmed that patient has no concerns with current condition  [facility] CM Short Term Goal #1   Over the next 30 days, patient will attend follow up provider appointments as scheduled, as evidenced by patient reporting, collaboration with providers as indicated, and review of EMR during [facility] RN CCM outreach  [facility] CM Short Term Goal #1 Start Date  03/20/18  Interventions for Short Term Goal #1  Discussed with patient  recent appointments to podiatrist and PCP,  reviewed outcome of office visits and upcoming scheduled provider appointments with patient  [facility] CM Short Term Goal #2   Over the next 30 days, patient will actively participate in home health services as ordered post-hospital discharge, as evidenced by patient reporting and collaboration with [facility] agency team as indicated  [facility] CM Short Term Goal #2 Start Date  03/20/18  Interventions for Short Term Goal #2  Confirmed that patient is actively participating in home health services and reviewed recent home health team visits with patient,  positive reinforcement provided for patient's ongoing active participation in [facility] services    [facility] CM Care Plan Problem Two     Most Recent Value  Care Plan Problem Two  Knowledge deficit around self-health management strategies of chronic disease state of CHF, as evidenced by patient reporting and new diagnosis of CHF  Role Documenting the Problem Two  Care Management Coordinator  Care Plan for Problem Two  Active  Interventions for Problem Two Long Term Goal   Re-iterated signs/ symptoms CHF zones with patient and confirmed that patient is in green CHF zone,  reviewed with patient signs/ symptoms concerning for yellow zone, along with action plans for green/ yellow zones  [facility] Long Term Goal  Over the next 50 days, patient will be able to verbalize signs/ symptoms yellow CHF zone, along with corresponding action plan, as evidenced by patient reporting during [facility] RN CCM outreach  [facility] Long Term Goal Start Date  04/04/18  [facility] CM Short Term Goal #1   Over the next 30 days, patient will continue monitoring and recording daily weights, as evidenced by review of same during [place] outreach  [facility] CM  Short Term Goal #1 Start Date  04/04/18  Interventions for Short Term Goal #2   Reviewed patient's recently recorded weights at home and provided positive reinforcement for patient continuing to monitor daily weights,  reinforced weight  gain guidelines in setting of CHF and confirmed that patient is able to verbalize action plan for weight gain     Oneta Rack, RN, BSN, [employer] Coordinator [facility] Care Management  [phone]

## 2018-04-16 DIAGNOSIS — I110 Hypertensive heart disease with heart failure: Secondary | ICD-10-CM | POA: Diagnosis not present

## 2018-04-16 DIAGNOSIS — I2510 Atherosclerotic heart disease of native coronary artery without angina pectoris: Secondary | ICD-10-CM | POA: Diagnosis not present

## 2018-04-16 DIAGNOSIS — E7800 Pure hypercholesterolemia, unspecified: Secondary | ICD-10-CM | POA: Diagnosis not present

## 2018-04-16 DIAGNOSIS — I700 Atherosclerosis of aorta: Secondary | ICD-10-CM | POA: Diagnosis not present

## 2018-04-16 DIAGNOSIS — D509 Iron deficiency anemia, unspecified: Secondary | ICD-10-CM | POA: Diagnosis not present

## 2018-04-16 DIAGNOSIS — I272 Pulmonary hypertension, unspecified: Secondary | ICD-10-CM | POA: Diagnosis not present

## 2018-04-16 DIAGNOSIS — E1151 Type 2 diabetes mellitus with diabetic peripheral angiopathy without gangrene: Secondary | ICD-10-CM | POA: Diagnosis not present

## 2018-04-16 DIAGNOSIS — I5032 Chronic diastolic (congestive) heart failure: Secondary | ICD-10-CM | POA: Diagnosis not present

## 2018-04-16 DIAGNOSIS — I482 Chronic atrial fibrillation: Secondary | ICD-10-CM | POA: Diagnosis not present

## 2018-04-18 DIAGNOSIS — I2510 Atherosclerotic heart disease of native coronary artery without angina pectoris: Secondary | ICD-10-CM | POA: Diagnosis not present

## 2018-04-18 DIAGNOSIS — E7800 Pure hypercholesterolemia, unspecified: Secondary | ICD-10-CM | POA: Diagnosis not present

## 2018-04-18 DIAGNOSIS — I272 Pulmonary hypertension, unspecified: Secondary | ICD-10-CM | POA: Diagnosis not present

## 2018-04-18 DIAGNOSIS — I700 Atherosclerosis of aorta: Secondary | ICD-10-CM | POA: Diagnosis not present

## 2018-04-18 DIAGNOSIS — I482 Chronic atrial fibrillation: Secondary | ICD-10-CM | POA: Diagnosis not present

## 2018-04-18 DIAGNOSIS — E1151 Type 2 diabetes mellitus with diabetic peripheral angiopathy without gangrene: Secondary | ICD-10-CM | POA: Diagnosis not present

## 2018-04-18 DIAGNOSIS — I5032 Chronic diastolic (congestive) heart failure: Secondary | ICD-10-CM | POA: Diagnosis not present

## 2018-04-18 DIAGNOSIS — I110 Hypertensive heart disease with heart failure: Secondary | ICD-10-CM | POA: Diagnosis not present

## 2018-04-18 DIAGNOSIS — D509 Iron deficiency anemia, unspecified: Secondary | ICD-10-CM | POA: Diagnosis not present

## 2018-04-19 ENCOUNTER — Telehealth (HOSPITAL_COMMUNITY): Payer: Self-pay | Admitting: Vascular Surgery

## 2018-04-19 ENCOUNTER — Ambulatory Visit (INDEPENDENT_AMBULATORY_CARE_PROVIDER_SITE_OTHER): Payer: Medicare HMO | Admitting: Cardiovascular Disease

## 2018-04-19 ENCOUNTER — Encounter: Payer: Self-pay | Admitting: Cardiovascular Disease

## 2018-04-19 VITALS — BP 142/50 | HR 64 | Ht 61.0 in | Wt 126.0 lb

## 2018-04-19 DIAGNOSIS — I482 Chronic atrial fibrillation, unspecified: Secondary | ICD-10-CM

## 2018-04-19 DIAGNOSIS — E1151 Type 2 diabetes mellitus with diabetic peripheral angiopathy without gangrene: Secondary | ICD-10-CM | POA: Diagnosis not present

## 2018-04-19 DIAGNOSIS — I700 Atherosclerosis of aorta: Secondary | ICD-10-CM | POA: Diagnosis not present

## 2018-04-19 DIAGNOSIS — I6523 Occlusion and stenosis of bilateral carotid arteries: Secondary | ICD-10-CM | POA: Diagnosis not present

## 2018-04-19 DIAGNOSIS — I110 Hypertensive heart disease with heart failure: Secondary | ICD-10-CM | POA: Diagnosis not present

## 2018-04-19 DIAGNOSIS — Z952 Presence of prosthetic heart valve: Secondary | ICD-10-CM

## 2018-04-19 DIAGNOSIS — I2510 Atherosclerotic heart disease of native coronary artery without angina pectoris: Secondary | ICD-10-CM | POA: Diagnosis not present

## 2018-04-19 DIAGNOSIS — I5033 Acute on chronic diastolic (congestive) heart failure: Secondary | ICD-10-CM | POA: Diagnosis not present

## 2018-04-19 DIAGNOSIS — I5032 Chronic diastolic (congestive) heart failure: Secondary | ICD-10-CM | POA: Diagnosis not present

## 2018-04-19 DIAGNOSIS — D509 Iron deficiency anemia, unspecified: Secondary | ICD-10-CM | POA: Diagnosis not present

## 2018-04-19 DIAGNOSIS — E7800 Pure hypercholesterolemia, unspecified: Secondary | ICD-10-CM | POA: Diagnosis not present

## 2018-04-19 DIAGNOSIS — I272 Pulmonary hypertension, unspecified: Secondary | ICD-10-CM

## 2018-04-19 NOTE — Assessment & Plan Note (Signed)
History of chronic A. fib rate controlled on Coumadin anticoagulation. 

## 2018-04-19 NOTE — Patient Instructions (Signed)
Medication Instructions:   NO CHANGE  Testing/Procedures:  Your physician has requested that you have an echocardiogram. Echocardiography is a painless test that uses sound waves to create images of your heart. It provides your doctor with information about the size and shape of your heart and how well your heart's chambers and valves are working. This procedure takes approximately one hour. There are no restrictions for this procedure.SCHEDULE IN ONE YEAR    Follow-Up:  Your physician wants you to follow-up in: 6 MONTHS WITH EXTENDER You will receive a reminder letter in the mail two months in advance. If you don't receive a letter, please call our office to schedule the follow-up appointment.   Your physician wants you to follow-up in: [place] will receive a reminder letter in the mail two months in advance. If you don't receive a letter, please call our office to schedule the follow-up appointment.   REFERRAL TO [facility] FOR PULMONARY HYPERTENSION

## 2018-04-19 NOTE — Telephone Encounter (Signed)
Patient will call back on 04/22/18 after she talks with her children to see when they are available to bring her for an appt.

## 2018-04-19 NOTE — Assessment & Plan Note (Signed)
History of essential hypertension her blood pressure measured at 142/50.  Is on Avapro, hydralazine metoprolol.  Continue current meds at current dosing.

## 2018-04-19 NOTE — Telephone Encounter (Signed)
Left pt message to make new pt pulm htn appt next ava not urgent per [facility]

## 2018-04-19 NOTE — Progress Notes (Signed)
04/19/2018 [name]   05/14/1941  [id number]  Primary Physician [name], [name], MD Primary Cardiologist: Lorretta Harp MD [name], [place]  HPI:  [name] is a 77 y.o.  mildly overweight, widowed Caucasian female mother of 51, grandmother to 32 grandchildren whose daughter unfortunately recently died of ALS after a long protracted course. I last saw her in the office  06/20/2017. .The patient has a history of CAD and valvular heart disease as well as PVOD. She is status post coronary artery bypass grafting x2 with a St. Jude AVR July 13, 2004. Her other problems include hypertension, hyperlipidemia, diabetes, and discontinued tobacco abuse. I dilated her left common iliac and redilated her for in-stent restenosis in 2006. We have been following her lower extremity and carotid Dopplers since that time. She does have mild to moderate right and mild left ICA stenosis which has remained stable. She is neurologically asymptomatic. She denies chest pain, shortness of breath or claudication. [name] follows her lipid profile which was done most recently in March revealing a total cholesterol of 106, LDL of 53 and HDL of 29. Her most recent carotid Doppler in June did show moderate right and moderately severe left ICA stenosis which had not changed, and her lower extremity Dopplers revealed ABIs of 1 bilaterally with bilateral iliac disease. Since I saw her last a year ago she's remained stable and is asymptomatic otherwise. She does give right total hip replacement which is scheduled to be performed in Dr. Jean Rosenthal next week. I'm going to get a 2-D echocardiogram to assess her aortic prosthesis as well as a pharmacologic Myoview stress test to stratify her symptoms been over a year since her last functional study and she is a diabetic of 10 years post bypass grafting.she did have a high-risk Myoview back in 2014 that she had a false positive Myoview back in 2006 after  her bypass surgery as well. She denies chest pain or shortness of breath. She ultimately never had her hip replacement.  Since I saw her a year ago she has had laparoscopic cholecystectomy with subsequent diastolic heart failure requiring diuresis.  She is also had GI bleeds requiring multiple blood transfusions.  Her echo performed 02/24/2018 revealed normal LV systolic function, well-functioning aortic mechanical processes with severe pulmonary hypertension and a PA pressure of 88 mmHg.    Current Meds  Medication Sig  . acetaminophen (TYLENOL) 325 MG tablet Take 2 tablets (650 mg total) by mouth every 6 (six) hours as needed for mild pain, fever or headache (for pain or headaches).  . ALPRAZolam (XANAX) 0.5 MG tablet Take 0.25-0.5 mg by mouth See admin instructions. Take 0.25-0.5 mg two times a day as needed for anxiety and 0.25-0.5 mg at bedtime scheduled  . atorvastatin (LIPITOR) 80 MG tablet Take 1 tablet (80 mg total) by mouth daily. (Patient taking differently: Take 80 mg by mouth daily with supper. )  . Cholecalciferol (VITAMIN D3) 1000 UNITS CAPS Take 1,000 Units by mouth daily.   [name] COLCRYS 0.6 MG tablet   . ferrous sulfate 325 (65 FE) MG tablet Take 1 tablet (325 mg total) by mouth 2 (two) times daily with a meal.  . glipiZIDE (GLUCOTROL XL) 2.5 MG 24 hr tablet Take 2.5 mg by mouth daily.   . hydrALAZINE (APRESOLINE) 100 MG tablet Take 100 mg by mouth 2 (two) times daily. Take one-half (50 mg po) BID  . hydrocortisone cream 1 % Apply topically 2 (  two) times daily.  . irbesartan (AVAPRO) 300 MG tablet Take 1 tablet (300 mg total) by mouth daily.  . magnesium oxide (MAG-OX) 400 (241.3 Mg) MG tablet Take 1 tablet (400 mg total) by mouth daily.  . metFORMIN (GLUCOPHAGE) 500 MG tablet Take 500 mg by mouth 2 (two) times daily with a meal.   . metoprolol tartrate (LOPRESSOR) 50 MG tablet Take 50 mg by mouth 2 (two) times daily.  . ondansetron (ZOFRAN) 4 MG tablet   . pantoprazole (PROTONIX)  40 MG tablet Take 40 mg by mouth daily.   Vladimir Faster Glyc-Propyl Glyc PF (SYSTANE PRESERVATIVE FREE) 0.4-0.3 % SOLN Place 1-2 drops into both eyes 3 (three) times daily as needed (for irritation).   . potassium chloride SA (K-DUR,KLOR-CON) 20 MEQ tablet Take 1 tablet (20 mEq total) by mouth daily.  [name] saccharomyces boulardii (FLORASTOR) 250 MG capsule Take 1 capsule (250 mg total) by mouth 2 (two) times daily.  . traMADol (ULTRAM) 50 MG tablet Take 1 tablet (50 mg total) by mouth every 6 (six) hours as needed for moderate pain.  . vitamin B-12 (CYANOCOBALAMIN) 100 MCG tablet Take 100 mcg by mouth daily.      Allergies  Allergen Reactions  . Flagyl [Metronidazole] Rash    ALL-OVER BODY RASH  . Coreg [Carvedilol] Other (See Comments)    Terrible cramping in the feet and had a lot of bowel movements, but not diarrhea  . Losartan Swelling    Patient doesn't recall site of swelling  . Spironolactone Nausea Only    Caused extreme nausea  . Verapamil Hives  . Zetia [Ezetimibe] Other (See Comments)    Reaction not recalled  . Zocor [Simvastatin - High Dose] Other (See Comments)    Reaction not recalled    Social History   Socioeconomic History  . Marital status: Widowed    Spouse name: Not on file  . Number of children: Not on file  . Years of education: Not on file  . Highest education level: Not on file  Occupational History  . Occupation: Retired  Scientific laboratory technician  . Financial resource strain: Not on file  . Food insecurity:    Worry: Not on file    Inability: Not on file  . Transportation needs:    Medical: Not on file    Non-medical: Not on file  Tobacco Use  . Smoking status: Former Smoker    Packs/day: 1.00    Years: 30.00    Pack years: 30.00    Types: Cigarettes  . Smokeless tobacco: Never Used  Substance and Sexual Activity  . Alcohol use: No    Alcohol/week: 0.0 oz  . Drug use: No  . Sexual activity: Not on file  Lifestyle  . Physical activity:    Days per  week: Not on file    Minutes per session: Not on file  . Stress: Not on file  Relationships  . Social connections:    Talks on phone: Not on file    Gets together: Not on file    Attends religious service: Not on file    Active member of club or organization: Not on file    Attends meetings of clubs or organizations: Not on file    Relationship status: Not on file  . Intimate partner violence:    Fear of current or ex partner: Not on file    Emotionally abused: Not on file    Physically abused: Not on file    Forced sexual  activity: Not on file  Other Topics Concern  . Not on file  Social History Narrative  . Not on file     Review of Systems: General: negative for chills, fever, night sweats or weight changes.  Cardiovascular: negative for chest pain, dyspnea on exertion, edema, orthopnea, palpitations, paroxysmal nocturnal dyspnea or shortness of breath Dermatological: negative for rash Respiratory: negative for cough or wheezing Urologic: negative for hematuria Abdominal: negative for nausea, vomiting, diarrhea, bright red blood per rectum, melena, or hematemesis Neurologic: negative for visual changes, syncope, or dizziness All other systems reviewed and are otherwise negative except as noted above.    Blood pressure (!) 142/50, pulse 64, height 5\' 1"  (1.549 m), weight 126 lb (57.2 kg).  General appearance: alert and no distress Neck: no adenopathy, no JVD, supple, symmetrical, trachea midline, thyroid not enlarged, symmetric, no tenderness/mass/nodules and Bilateral carotid bruits right louder the left Lungs: clear to auscultation bilaterally Heart: irregularly irregular rhythm and Crisp prosthetic valve sounds Extremities: extremities normal, atraumatic, no cyanosis or edema Pulses: 2+ and symmetric Skin: Skin color, texture, turgor normal. No rashes or lesions Neurologic: Alert and oriented X 3, normal strength and tone. Normal symmetric reflexes. Normal coordination  and gait  EKG not performed today  ASSESSMENT AND PLAN:   Hypertension History of essential hypertension her blood pressure measured at 142/50.  Is on Avapro, hydralazine metoprolol.  Continue current meds at current dosing.  Hypercholesteremia History of hyperlipidemia on statin therapy  Mechanical heart valve present History of [place] Jude AVR for aortic stenosis July 13, 2004 which we have been following by 2D echocardiography.  Her most recent echo performed 02/24/2018 revealed normal LV systolic function, and well-functioning aortic mechanical prosthesis with normal gradients and significantly elevated PA pressures of 88 mmHg.  She is on Coumadin anticoagulation.  Coronary artery disease History of CAD status post coronary artery bypass grafting times 210/5/05 at the time of AVR.  Her last Myoview in 2014 was nonischemic.  She denies chest pain.  Carotid artery disease (HCC) History of carotid artery disease with moderate right and moderately severe left ICA stenosis by duplex ultrasound sound 04/20/2017.  This is scheduled to be repeated in the next several weeks.  She would be high risk for endarterectomy and may require carotid artery stenting if she has progression of disease.  Atrial fibrillation (HCC) History of chronic A. fib rate controlled on Coumadin anticoagulation.  Acute on chronic diastolic heart failure [facility]) [name] was recently admitted with volume overload after cholecystectomy with acute on chronic diastolic heart failure requiring IV diuresis.  She appears to be at her dry weight and is clinically improved.      Lorretta Harp MD FACP,FACC,FAHA, [facility] 04/19/2018 11:58 AM

## 2018-04-19 NOTE — Assessment & Plan Note (Signed)
History of [place] AVR for aortic stenosis July 13, 2004 which we have been following by 2D echocardiography.  Her most recent echo performed 02/24/2018 revealed normal LV systolic function, and well-functioning aortic mechanical prosthesis with normal gradients and significantly elevated PA pressures of 88 mmHg.  She is on Coumadin anticoagulation.

## 2018-04-19 NOTE — Assessment & Plan Note (Signed)
History of carotid artery disease with moderate right and moderately severe left ICA stenosis by duplex ultrasound sound 04/20/2017.  This is scheduled to be repeated in the next several weeks.  She would be high risk for endarterectomy and may require carotid artery stenting if she has progression of disease.

## 2018-04-19 NOTE — Assessment & Plan Note (Signed)
History of hyperlipidemia on statin therapy. 

## 2018-04-19 NOTE — Assessment & Plan Note (Signed)
History of CAD status post coronary artery bypass grafting times 210/5/05 at the time of AVR.  Her last Myoview in 2014 was nonischemic.  She denies chest pain.

## 2018-04-19 NOTE — Assessment & Plan Note (Signed)
Ms. [name] was recently admitted with volume overload after cholecystectomy with acute on chronic diastolic heart failure requiring IV diuresis.  She appears to be at her dry weight and is clinically improved.

## 2018-04-22 ENCOUNTER — Ambulatory Visit (INDEPENDENT_AMBULATORY_CARE_PROVIDER_SITE_OTHER): Payer: Medicare HMO | Admitting: Pharmacist

## 2018-04-22 ENCOUNTER — Encounter: Payer: Self-pay | Admitting: *Deleted

## 2018-04-22 ENCOUNTER — Other Ambulatory Visit: Payer: Self-pay | Admitting: *Deleted

## 2018-04-22 DIAGNOSIS — D500 Iron deficiency anemia secondary to blood loss (chronic): Secondary | ICD-10-CM | POA: Diagnosis not present

## 2018-04-22 DIAGNOSIS — I2510 Atherosclerotic heart disease of native coronary artery without angina pectoris: Secondary | ICD-10-CM | POA: Diagnosis not present

## 2018-04-22 DIAGNOSIS — Z7901 Long term (current) use of anticoagulants: Secondary | ICD-10-CM | POA: Diagnosis not present

## 2018-04-22 DIAGNOSIS — K811 Chronic cholecystitis: Secondary | ICD-10-CM | POA: Diagnosis not present

## 2018-04-22 DIAGNOSIS — I5032 Chronic diastolic (congestive) heart failure: Secondary | ICD-10-CM | POA: Diagnosis not present

## 2018-04-22 DIAGNOSIS — D509 Iron deficiency anemia, unspecified: Secondary | ICD-10-CM | POA: Diagnosis not present

## 2018-04-22 DIAGNOSIS — Z952 Presence of prosthetic heart valve: Secondary | ICD-10-CM

## 2018-04-22 DIAGNOSIS — E1151 Type 2 diabetes mellitus with diabetic peripheral angiopathy without gangrene: Secondary | ICD-10-CM | POA: Diagnosis not present

## 2018-04-22 DIAGNOSIS — I272 Pulmonary hypertension, unspecified: Secondary | ICD-10-CM | POA: Diagnosis not present

## 2018-04-22 DIAGNOSIS — I110 Hypertensive heart disease with heart failure: Secondary | ICD-10-CM | POA: Diagnosis not present

## 2018-04-22 DIAGNOSIS — I700 Atherosclerosis of aorta: Secondary | ICD-10-CM | POA: Diagnosis not present

## 2018-04-22 DIAGNOSIS — R740 Nonspecific elevation of levels of transaminase and lactic acid dehydrogenase [LDH]: Secondary | ICD-10-CM | POA: Diagnosis not present

## 2018-04-22 DIAGNOSIS — E7800 Pure hypercholesterolemia, unspecified: Secondary | ICD-10-CM | POA: Diagnosis not present

## 2018-04-22 DIAGNOSIS — I482 Chronic atrial fibrillation: Secondary | ICD-10-CM | POA: Diagnosis not present

## 2018-04-22 LAB — POCT INR: INR: 1.2 — AB (ref 2.0–3.0)

## 2018-04-22 NOTE — Patient Outreach (Signed)
[facility]) Care Management [place] Telephone Outreach Post-hospital discharge day 40  04/22/2018  [name] [birth year] [id number]  Successful telephone outreach to [name], 77 y.o.femalereferred to [place] after recent hospitalization May 16-28, 2019 for abdominal pain, nausea/ vomiting/ diarrhea; patient hadlap chole on Mar 01, 2018 and was discharged home to self-care with home health services for RN/ PT through [place].Patient has history including, but not limited to, HTN/ HLD; DM; GERD; A-fib; AVR on ACT; PVD; CHF; CAD; and anxiety.Unfortunately, patient experienced hospital re-admission one day afterprevious hospitaldischarge on May 29-March 13, 2018 for shortness of breath, hypoxia, pulmonary edema consistent with dCHF exacerbation. Patient was againdischarged home to self-care with [facility] place [facility]. HIPAA/ identity verified during phone call today.  Today, patient reports she "is doing really good;" states that she is having her "usual" knee pain which is "better with PT sessions" and continues to be relieved with prescribed medications; patient reports previously reported "gout flare" has now resolved.  Patient denies new/ recent falls, and reports that she is no longer having to use walker, but is continuing to use her cane when she "goes outside of the house."  Again reports that recently reported rash on (R) LE has now "resolved."   Patient further reports:  Medications: -- denies questions/ concerns/ issues or problems around medications; reports taking all as prescribed  Home health [facility]) services: -- [place] [facility]- reports visits going well; RN scheduled to visit today in her home for INR monitoring -- confirms that she continues actively participating in all [facility] services    Provider appointments: -- Allrecent [place] provider appointments were reviewed with patient today- patient verbalizes plans to attend all scheduled provider appointments, stating her son will continue to transport her: attended cardiology appointment 04/19/18-- reports 'got a good report, he made no changes." -- during cardiology office visit, carotid evaluation was scheduled for Monday 04/29/18- verbalizes plans to attend this appointment  Self-health management ofchronic disease state of CHF: -- denies issues with breathing/ shortness of breath/ LE swelling --continues monitoring and recording daily weights, BP's, and HR values: Reports weight this morning125lbs, with daily weight ranges consistently between 124-126 lbs consistently; positive reinforcement provided for monitoring and recording daily weights-- patient again encouraged to continue this practice, and she agrees to do so -- reiterated with patient previously provided printed EMMI educational material around self-health management of chronic disease state of CHF -- reports in "green CHF zone" today; able to verbalize signs/ symptoms/ corresponding action plans for green/ yellow CHF zones, along with weight gain guidelines in setting of CHF  Patient denies further issues, concerns, or problems today. Iconfirmed that patient hasmy direct phone number, the main [facility] CM office phone number, and the [facility] CM 24-hour nurse advice phone number should issues arise prior to next scheduled [place] outreachby telephone in 2 weeks.Encouraged patient to contact me directly if needs, questions, issues, or concerns arise prior to next scheduled outreach; patient agreed to do so.  Plan:  Patient will take medications as prescribed and will attend all scheduled provider appointments post-hospital discharge  Patient will continue actively participating in home health services as ordered post-hospital discharge  Patient  will continue monitoring and recording daily weightsand will review educational material previously provided to her on CHF zones/ action plans as indicated  Patient will promptly notify  care providers for any new concerns/ issues/ problems that arise  [facility] RN CM outreach to continue with scheduledphone callin 2 weeks  [facility] CM Care Plan Problem One     Most Recent Value  Care Plan Problem One  High risk for readmission as evidenced by recent hosptal readmission for CHF exacerbation post initial hospitalization for surgical lap/chole.  Role Documenting the Problem One  Care Management Coordinator  Care Plan for Problem One  Not Active  [facility] Long Term Goal   Over the next 31 days, patient will not experience hospital readmission, as evidenced by patient reporting and review of EMR during [facility] RN CCM outreach  [place] Term Goal Start Date  03/20/18  [facility] Long Term Goal Met Date  04/22/18- Goal Met  Interventions for Problem One Long Term Goal  Patient did not experience hospital re-admission within 31 days of last discharge [Goal Met]  [facility] CM Short Term Goal #1   Over the next 30 days, patient will attend follow up provider appointments as scheduled, as evidenced by patient reporting, collaboration with providers as indicated, and review of EMR during [facility] RN CCM outreach  [facility] CM Short Term Goal #1 Start Date  03/20/18  [facility] CM Short Term Goal #1 Met Date  04/22/18 - Goal Met  Interventions for Short Term Goal #1  Confirmed that patient has contiued attending all scheduled provider appointments [Goal Met]  [facility] CM Short Term Goal #2   Over the next 30 days, patient will actively participate in home health services as ordered post-hospital discharge, as evidenced by patient reporting and collaboration with [facility] agency team as indicated  [facility] CM Short Term Goal #2 Start Date  03/20/18  [facility] CM Short Term Goal #2 Met Date  04/22/18 - Goal Met  Interventions for Short Term Goal #2  Confirmed that  patient has continued participating in home health sevrices as ordered post-hospital discharge,  discussed recent and upcoming home health visits by [facility] team - goal met    [facility] CM Care Plan Problem Two     Most Recent Value  Care Plan Problem Two  Knowledge deficit around self-health management strategies of chronic disease state of CHF, as evidenced by patient reporting and new diagnosis of CHF  Role Documenting the Problem Two  Care Management Coordinator  Care Plan for Problem Two  Active  Interventions for Problem Two Long Term Goal   Discussed current clinical condition with patient,  confirmed that patient has no concerns with medications, breathing status, swellling,  confirmed that patient is in "green" CHF zone and is able to verbalize signs/ symptoms yellow zone with corresponding action plan,  discussed/ reviewed recently attended cardiology provider office visit with patient  [facility] Long Term Goal  Over the next 50 days, patient will be able to verbalize signs/ symptoms yellow CHF zone, along with corresponding action plan, as evidenced by patient reporting during [facility] RN CCM outreach  [facility] Long Term Goal Start Date  04/04/18  [facility] CM Short Term Goal #1   Over the next 30 days, patient will continue monitoring and recording daily weights, as evidenced by review of same during [place] CCM outreach  [facility] CM Short Term Goal #1 Start Date  04/04/18  Interventions for Short Term Goal #2   Confirmed that patient has continued monitoring and recording daily weights and reviewed all recently recorded weights at home with patient,  reiterated weight gain guidelines in setting of CHF with patient  Oneta Rack, RN, BSN, [employer] [facility] Care Management  [phone]

## 2018-04-23 DIAGNOSIS — E1151 Type 2 diabetes mellitus with diabetic peripheral angiopathy without gangrene: Secondary | ICD-10-CM | POA: Diagnosis not present

## 2018-04-23 DIAGNOSIS — I2510 Atherosclerotic heart disease of native coronary artery without angina pectoris: Secondary | ICD-10-CM | POA: Diagnosis not present

## 2018-04-23 DIAGNOSIS — I5032 Chronic diastolic (congestive) heart failure: Secondary | ICD-10-CM | POA: Diagnosis not present

## 2018-04-23 DIAGNOSIS — I700 Atherosclerosis of aorta: Secondary | ICD-10-CM | POA: Diagnosis not present

## 2018-04-23 DIAGNOSIS — D509 Iron deficiency anemia, unspecified: Secondary | ICD-10-CM | POA: Diagnosis not present

## 2018-04-23 DIAGNOSIS — I272 Pulmonary hypertension, unspecified: Secondary | ICD-10-CM | POA: Diagnosis not present

## 2018-04-23 DIAGNOSIS — E7800 Pure hypercholesterolemia, unspecified: Secondary | ICD-10-CM | POA: Diagnosis not present

## 2018-04-23 DIAGNOSIS — I482 Chronic atrial fibrillation: Secondary | ICD-10-CM | POA: Diagnosis not present

## 2018-04-23 DIAGNOSIS — I110 Hypertensive heart disease with heart failure: Secondary | ICD-10-CM | POA: Diagnosis not present

## 2018-04-25 ENCOUNTER — Ambulatory Visit (INDEPENDENT_AMBULATORY_CARE_PROVIDER_SITE_OTHER): Payer: Medicare HMO | Admitting: Pharmacist

## 2018-04-25 DIAGNOSIS — E1151 Type 2 diabetes mellitus with diabetic peripheral angiopathy without gangrene: Secondary | ICD-10-CM | POA: Diagnosis not present

## 2018-04-25 DIAGNOSIS — Z7901 Long term (current) use of anticoagulants: Secondary | ICD-10-CM | POA: Diagnosis not present

## 2018-04-25 DIAGNOSIS — I5032 Chronic diastolic (congestive) heart failure: Secondary | ICD-10-CM | POA: Diagnosis not present

## 2018-04-25 DIAGNOSIS — D509 Iron deficiency anemia, unspecified: Secondary | ICD-10-CM | POA: Diagnosis not present

## 2018-04-25 DIAGNOSIS — I482 Chronic atrial fibrillation: Secondary | ICD-10-CM | POA: Diagnosis not present

## 2018-04-25 DIAGNOSIS — I700 Atherosclerosis of aorta: Secondary | ICD-10-CM | POA: Diagnosis not present

## 2018-04-25 DIAGNOSIS — I110 Hypertensive heart disease with heart failure: Secondary | ICD-10-CM | POA: Diagnosis not present

## 2018-04-25 DIAGNOSIS — I272 Pulmonary hypertension, unspecified: Secondary | ICD-10-CM | POA: Diagnosis not present

## 2018-04-25 DIAGNOSIS — I2510 Atherosclerotic heart disease of native coronary artery without angina pectoris: Secondary | ICD-10-CM | POA: Diagnosis not present

## 2018-04-25 DIAGNOSIS — Z952 Presence of prosthetic heart valve: Secondary | ICD-10-CM

## 2018-04-25 DIAGNOSIS — E7800 Pure hypercholesterolemia, unspecified: Secondary | ICD-10-CM | POA: Diagnosis not present

## 2018-04-25 LAB — POCT INR: INR: 1.5 — AB (ref 2.0–3.0)

## 2018-04-29 ENCOUNTER — Other Ambulatory Visit: Payer: Self-pay | Admitting: Cardiovascular Disease

## 2018-04-29 ENCOUNTER — Ambulatory Visit (HOSPITAL_BASED_OUTPATIENT_CLINIC_OR_DEPARTMENT_OTHER)
Admission: RE | Admit: 2018-04-29 | Discharge: 2018-04-29 | Disposition: A | Discharge status: Home / Self Care | Payer: Medicare HMO | Source: Ambulatory Visit | Attending: Cardiovascular Disease | Admitting: Cardiovascular Disease

## 2018-04-29 ENCOUNTER — Ambulatory Visit (HOSPITAL_COMMUNITY)
Admission: RE | Admit: 2018-04-29 | Discharge: 2018-04-29 | Disposition: A | Discharge status: Home / Self Care | Payer: Medicare HMO | Source: Ambulatory Visit | Attending: Cardiovascular Disease | Admitting: Cardiovascular Disease

## 2018-04-29 DIAGNOSIS — I6523 Occlusion and stenosis of bilateral carotid arteries: Secondary | ICD-10-CM

## 2018-04-29 DIAGNOSIS — I739 Peripheral vascular disease, unspecified: Secondary | ICD-10-CM | POA: Insufficient documentation

## 2018-04-30 ENCOUNTER — Other Ambulatory Visit: Payer: Self-pay | Admitting: *Deleted

## 2018-04-30 ENCOUNTER — Ambulatory Visit (INDEPENDENT_AMBULATORY_CARE_PROVIDER_SITE_OTHER): Payer: Medicare HMO | Admitting: Pharmacist Clinician (PhC)/ Clinical Pharmacy Specialist

## 2018-04-30 DIAGNOSIS — Z7901 Long term (current) use of anticoagulants: Secondary | ICD-10-CM | POA: Diagnosis not present

## 2018-04-30 DIAGNOSIS — I272 Pulmonary hypertension, unspecified: Secondary | ICD-10-CM | POA: Diagnosis not present

## 2018-04-30 DIAGNOSIS — I5032 Chronic diastolic (congestive) heart failure: Secondary | ICD-10-CM | POA: Diagnosis not present

## 2018-04-30 DIAGNOSIS — Z952 Presence of prosthetic heart valve: Secondary | ICD-10-CM

## 2018-04-30 DIAGNOSIS — I6523 Occlusion and stenosis of bilateral carotid arteries: Secondary | ICD-10-CM

## 2018-04-30 DIAGNOSIS — I700 Atherosclerosis of aorta: Secondary | ICD-10-CM | POA: Diagnosis not present

## 2018-04-30 DIAGNOSIS — I110 Hypertensive heart disease with heart failure: Secondary | ICD-10-CM | POA: Diagnosis not present

## 2018-04-30 DIAGNOSIS — E7800 Pure hypercholesterolemia, unspecified: Secondary | ICD-10-CM | POA: Diagnosis not present

## 2018-04-30 DIAGNOSIS — I482 Chronic atrial fibrillation: Secondary | ICD-10-CM | POA: Diagnosis not present

## 2018-04-30 DIAGNOSIS — I2510 Atherosclerotic heart disease of native coronary artery without angina pectoris: Secondary | ICD-10-CM | POA: Diagnosis not present

## 2018-04-30 DIAGNOSIS — E1151 Type 2 diabetes mellitus with diabetic peripheral angiopathy without gangrene: Secondary | ICD-10-CM | POA: Diagnosis not present

## 2018-04-30 DIAGNOSIS — D509 Iron deficiency anemia, unspecified: Secondary | ICD-10-CM | POA: Diagnosis not present

## 2018-04-30 LAB — POCT INR: INR: 1.5 — AB (ref 2.0–3.0)

## 2018-05-02 DIAGNOSIS — I5032 Chronic diastolic (congestive) heart failure: Secondary | ICD-10-CM | POA: Diagnosis not present

## 2018-05-02 DIAGNOSIS — D509 Iron deficiency anemia, unspecified: Secondary | ICD-10-CM | POA: Diagnosis not present

## 2018-05-02 DIAGNOSIS — I700 Atherosclerosis of aorta: Secondary | ICD-10-CM | POA: Diagnosis not present

## 2018-05-02 DIAGNOSIS — I482 Chronic atrial fibrillation: Secondary | ICD-10-CM | POA: Diagnosis not present

## 2018-05-02 DIAGNOSIS — I2510 Atherosclerotic heart disease of native coronary artery without angina pectoris: Secondary | ICD-10-CM | POA: Diagnosis not present

## 2018-05-02 DIAGNOSIS — E1151 Type 2 diabetes mellitus with diabetic peripheral angiopathy without gangrene: Secondary | ICD-10-CM | POA: Diagnosis not present

## 2018-05-02 DIAGNOSIS — E7800 Pure hypercholesterolemia, unspecified: Secondary | ICD-10-CM | POA: Diagnosis not present

## 2018-05-02 DIAGNOSIS — I272 Pulmonary hypertension, unspecified: Secondary | ICD-10-CM | POA: Diagnosis not present

## 2018-05-02 DIAGNOSIS — I110 Hypertensive heart disease with heart failure: Secondary | ICD-10-CM | POA: Diagnosis not present

## 2018-05-06 ENCOUNTER — Ambulatory Visit (INDEPENDENT_AMBULATORY_CARE_PROVIDER_SITE_OTHER): Payer: Medicare HMO | Admitting: Pharmacist Clinician (PhC)/ Clinical Pharmacy Specialist

## 2018-05-06 ENCOUNTER — Encounter: Payer: Self-pay | Admitting: *Deleted

## 2018-05-06 ENCOUNTER — Other Ambulatory Visit: Payer: Self-pay | Admitting: *Deleted

## 2018-05-06 DIAGNOSIS — I482 Chronic atrial fibrillation, unspecified: Secondary | ICD-10-CM

## 2018-05-06 DIAGNOSIS — I272 Pulmonary hypertension, unspecified: Secondary | ICD-10-CM | POA: Diagnosis not present

## 2018-05-06 DIAGNOSIS — D509 Iron deficiency anemia, unspecified: Secondary | ICD-10-CM | POA: Diagnosis not present

## 2018-05-06 DIAGNOSIS — E1151 Type 2 diabetes mellitus with diabetic peripheral angiopathy without gangrene: Secondary | ICD-10-CM | POA: Diagnosis not present

## 2018-05-06 DIAGNOSIS — Z952 Presence of prosthetic heart valve: Secondary | ICD-10-CM | POA: Diagnosis not present

## 2018-05-06 DIAGNOSIS — I700 Atherosclerosis of aorta: Secondary | ICD-10-CM | POA: Diagnosis not present

## 2018-05-06 DIAGNOSIS — Z7901 Long term (current) use of anticoagulants: Secondary | ICD-10-CM

## 2018-05-06 DIAGNOSIS — E7800 Pure hypercholesterolemia, unspecified: Secondary | ICD-10-CM | POA: Diagnosis not present

## 2018-05-06 DIAGNOSIS — I5032 Chronic diastolic (congestive) heart failure: Secondary | ICD-10-CM | POA: Diagnosis not present

## 2018-05-06 DIAGNOSIS — I110 Hypertensive heart disease with heart failure: Secondary | ICD-10-CM | POA: Diagnosis not present

## 2018-05-06 DIAGNOSIS — I2510 Atherosclerotic heart disease of native coronary artery without angina pectoris: Secondary | ICD-10-CM | POA: Diagnosis not present

## 2018-05-06 LAB — POCT INR: INR: 1.6 — AB (ref 2.0–3.0)

## 2018-05-06 NOTE — Patient Outreach (Signed)
[facility] [facility]) Care Management [place] Telephone Outreach Post-hospital discharge day 54  05/06/2018  [name] 12-May-1941 [id number]  10:50 am:  Unsuccessful telephone outreach toDorothy F [name],77 y.o.femalereferred to [place] after recent hospitalization May 16-28, 2019 for abdominal pain, nausea/ vomiting/ diarrhea; patient hadlap chole on Mar 01, 2018 and was discharged home to self-care with home health services for RN/ PT through [place].Patient has history including, but not limited to, HTN/ HLD; DM; GERD; A-fib; AVR on ACT; PVD; CHF; CAD; and anxiety.Unfortunately, patient experienced hospital re-admission one day afterprevious hospitaldischarge on May 29-March 13, 2018 for shortness of breath, hypoxia, pulmonary edema consistent with dCHF exacerbation. Patient was againdischarged home to self-care with home health services in place [facility].  HIPAA compliant voice mail message left for patient, requesting return call back.  12:00 noon: Successful incoming telephone call frompatient; patient stated she was returning my call from earlier today; HIPAA/ identity verified.  Today, patient reports she "is doing about the same; just fine;" and she denies pain, new/ recent falls, concerns/ problems.  Reports home health nurse visited this morning and completed INR testing.  Patient sounds to be in no obvious/ apparent distress throughout entirety of phone call today.  Patient further reports:  Medications: -- denies questions/ concerns/ issues or problems around medications; reports taking all as prescribed; denies recent changes to medications and reports coumadin continues being adjusted around INR   Home health [facility]) services: -- [facility]; reports visits going well; RN visited today, primarily for INR monitoring -- confirms that she continues  actively participating in all [facility] services   Provider appointments: -- Allrecent andupcoming provider appointments were reviewed with patient today- patient verbalizes plans to attend all scheduled provider appointments, continues to report son willtransport her until she begins driving again, which she believes she will try to start doing in short distances, "soon."  Verbalizes plans to have son ride with her for short distances until she is fully recovered. -- attended carotid evaluation as scheduled on Monday 04/29/18- reports "no changes; will go back in one year."  Self-health management ofchronic disease state of CHF: -- denies issues with breathing/ shortness of breath/ LE swelling --continues monitoring and recording daily weights, BP's, and HR values: Reports weight this morning126lbs, with daily weight ranges consistently between 126-127 lbs consistently; positive reinforcement provided for monitoring and recording daily weights-- patient again encouraged to continue this practice, and she agrees to do so, stating she has now made this a part of her daily routine -- reports in "green CHF zone" today; able to verbalize signs/ symptoms/ corresponding action plans for green/ yellow CHF zones, along with weight gain guidelines in setting of CHF  Patient denies further issues, concerns, or problems today, and denies need for [facility] RN CCM home visit, stating that she is doing well and is regularly checked by [facility] RN.  Iconfirmed that patient hasmy direct phone number, the main [facility] CM office phone number, and the [facility] CM 24-hour nurse advice phone number should issues arise prior to next scheduled [place] outreachby telephone in 3 weeks.Encouraged patient to contact me directly if needs, questions, issues, or concerns arise prior to next scheduled outreach; patient agreed to do so.  Plan:  Patient will take medications as prescribed and will attend all scheduled provider  appointments  Patient will continue actively participating in home health services as ordered post-hospital discharge  Patient will continue  monitoring and recording daily weightsand will review educational materialpreviouslyprovided to heron CHF zones/ action plans as indicated  Patient will promptly notify care providers for any new concerns/ issues/ problems that arise  [facility] [address] [facility] RN CM outreach to continue with scheduledphone callin 3 weeks  [facility] CM Care Plan Problem Two     Most Recent Value  Care Plan Problem Two  Knowledge deficit around self-health management strategies of chronic disease state of CHF, as evidenced by patient reporting and new diagnosis of CHF  Role Documenting the Problem Two  Care Management Coordinator  Care Plan for Problem Two  Active  Interventions for Problem Two Long Term Goal   Discussed with patient her currentl clinical status, recent home health visits, confirmed that patient continues to be able to verbalize signs/ symptoms yellow zone along with corresponding action plan,  confirmed that patient has remained in green zone post-most recent hospital discharge  [facility] Long Term Goal  Over the next 50 days, patient will be able to verbalize signs/ symptoms yellow CHF zone, along with corresponding action plan, as evidenced by patient reporting during [facility] RN CCM outreach  [facility] Long Term Goal Start Date  04/04/18  [facility] CM Short Term Goal #1   Over the next 30 days, patient will continue monitoring and recording daily weights, as evidenced by review of same during [place] outreach  [facility] CM Short Term Goal #1 Start Date  04/04/18  [facility] CM Short Term Goal #1 Met Date   05/06/18 [Goal met]  Interventions for Short Term Goal #2   Confirmed that patient has continued monitoring and recording daily weights and that she has incorporated this practice into her daily routines,  reviewed recently recorded weights at home with patient     Oneta Rack,  RN, BSN, [place] Coordinator [facility] Care Management  [phone]

## 2018-05-07 ENCOUNTER — Other Ambulatory Visit: Payer: Self-pay | Admitting: *Deleted

## 2018-05-07 DIAGNOSIS — I739 Peripheral vascular disease, unspecified: Secondary | ICD-10-CM

## 2018-05-07 DIAGNOSIS — I6523 Occlusion and stenosis of bilateral carotid arteries: Secondary | ICD-10-CM

## 2018-05-07 NOTE — Patient Outreach (Signed)
[facility]) Care Management [place] Coordination x 4  05/07/2018  MILKA WINDHOLZ 1941/04/09 [id number]  Care Coordination Outreach re: [name], [name] y.o.femalereferred to [place] after recent hospitalization May 16-28, 2019 for abdominal pain, nausea/ vomiting/ diarrhea; patient hadlap chole on Mar 01, 2018 and was discharged home to self-care with home health services for RN/ PT through [place].Patient has history including, but not limited to, HTN/ HLD; DM; GERD; A-fib; AVR on ACT; PVD; CHF; CAD; and anxiety.Unfortunately, patient experienced hospital re-admission one day afterprevious hospitaldischarge on May 29-March 13, 2018 for shortness of breath, hypoxia, pulmonary edema consistent with dCHF exacerbation. Patient was againdischarged home to self-care with home health services in place [facility] and [facility].  Received incoming voice mail message from patient, requesting call back; returned patient's call within 40 minutes of her call to me; patient reports today that last night she developed sudden/ acute onset of pain and burning "behind left ankle, going up toward knee" where patient is unable to visualize affected area; stated that she can feel a "small knot" and continues having pain and burning sensation; patient states her son is present at her home and she put son on phone, who reports that there is a dime-sized 'swollen knot" on her (L) posterior leg near her ankle and that her (L) leg is "splotchy looking."  Son reports that (L) leg "may be slightly cooler to touch" than (R) leg; patient denies numbness/ tingling, and states that she has contacted her PCP for advice and is waiting for a call-back.  Patient verbalizes that she was worried this may be a blood clot, and reports that she took her coumadin as directed this morning, as well as an additional baby ASA.  I  advised patient that to definitively rule out a blood clot, special testing would need to be completed, and that she should go to the ED to rule this possibility and obtain a diagnosis; Patient stated that she would prefer not to go to the ED, but would rather go to her PCP; I agreed to contact patient's PCP in attempt to facilitate urgent PCP office visit-- left detailed voice mail message on PCP office phone, requesting call back to patient/ or to me asap.  2:10 pm:  Received follow up phone call from patient who confirmed that PCP office nurse contacted her and scheduled urgent PCP office visit to evaluate reported symptoms in the morning at 08:30; patient confirms that her son will provide transportation to this appointment.  Plan:  Will collaborate with PCP office staff as indicated  Will contact patient later this week to follow up  Oneta Rack, RN, BSN, CCRN [facility] Care Management  [phone]

## 2018-05-08 DIAGNOSIS — M1A0720 Idiopathic chronic gout, left ankle and foot, without tophus (tophi): Secondary | ICD-10-CM | POA: Diagnosis not present

## 2018-05-08 DIAGNOSIS — Z6823 Body mass index (BMI) 23.0-23.9, adult: Secondary | ICD-10-CM | POA: Diagnosis not present

## 2018-05-09 ENCOUNTER — Other Ambulatory Visit: Payer: Self-pay | Admitting: *Deleted

## 2018-05-09 DIAGNOSIS — I272 Pulmonary hypertension, unspecified: Secondary | ICD-10-CM | POA: Diagnosis not present

## 2018-05-09 DIAGNOSIS — E7800 Pure hypercholesterolemia, unspecified: Secondary | ICD-10-CM | POA: Diagnosis not present

## 2018-05-09 DIAGNOSIS — I700 Atherosclerosis of aorta: Secondary | ICD-10-CM | POA: Diagnosis not present

## 2018-05-09 DIAGNOSIS — I2510 Atherosclerotic heart disease of native coronary artery without angina pectoris: Secondary | ICD-10-CM | POA: Diagnosis not present

## 2018-05-09 DIAGNOSIS — I5032 Chronic diastolic (congestive) heart failure: Secondary | ICD-10-CM | POA: Diagnosis not present

## 2018-05-09 DIAGNOSIS — D509 Iron deficiency anemia, unspecified: Secondary | ICD-10-CM | POA: Diagnosis not present

## 2018-05-09 DIAGNOSIS — I482 Chronic atrial fibrillation: Secondary | ICD-10-CM | POA: Diagnosis not present

## 2018-05-09 DIAGNOSIS — E1151 Type 2 diabetes mellitus with diabetic peripheral angiopathy without gangrene: Secondary | ICD-10-CM | POA: Diagnosis not present

## 2018-05-09 DIAGNOSIS — I110 Hypertensive heart disease with heart failure: Secondary | ICD-10-CM | POA: Diagnosis not present

## 2018-05-09 NOTE — Patient Outreach (Signed)
[facility]) Care Management [place] Coordination  05/09/2018  [name] 01/15/1941 [id number]  Care Coordination Outreach re: [name], [name] y.o.femalereferred to [place] after recent hospitalization May 16-28, 2019 for abdominal pain, nausea/ vomiting/ diarrhea; patient hadlap chole on Mar 01, 2018 and was discharged home to self-care with home health services for RN/ PT through [place].Patient has history including, but not limited to, HTN/ HLD; DM; GERD; A-fib; AVR on ACT; PVD; CHF; CAD; and anxiety.Unfortunately, patient experienced hospital re-admission one day afterprevious hospitaldischarge on May 29-March 13, 2018 for shortness of breath, hypoxia, pulmonary edema consistent with dCHF exacerbation. Patient was againdischarged home to self-care with home health services in place [facility]for nursing and [facility].  11:05 am:  Placed follow up care coordination telephone outreach to patient to follow up on previous concerns from 05/07/18 that she may have a blood clot; urgent MD office visit was arranged at that time for evaluation of patient's reported symptoms; left HIPAA compliant voice message for patient, requesting call back  11:45 am:  Patient returned my call; HIPAA/ identity verified; patient confirmed that she attended urgently arranged PCP office visit on 05/08/18; patient reported that PCP diagnosed her leg swelling/ pain as gout and advised her to re-start her regularly prescribed medication.  Patient reports she believes she is "better now," although she remains "frustrated with all the set-backs."  Emotional support provided.  Patient confirms that she has a scheduled follow up office visit with PCP in "about 2 weeks," and states she can not remember the exact date.  Patient denies further issues, concerns, or problems today.  I confirmed that patient has my direct  phone number, the main [facility] CM office phone number, and the [facility] CM 24-hour nurse advice phone number should issues arise prior to next scheduled [place] outreach.  Encouraged patient to contact me directly if needs, questions, issues, or concerns arise prior to next scheduled outreach; patient agreed to do so.  Plan:  [place] outreach to continue as scheduled   Oneta Rack, RN, BSN, [employer] Coordinator [facility] Care Management  [phone]

## 2018-05-10 ENCOUNTER — Ambulatory Visit: Payer: Self-pay | Admitting: *Deleted

## 2018-05-13 ENCOUNTER — Ambulatory Visit (INDEPENDENT_AMBULATORY_CARE_PROVIDER_SITE_OTHER): Payer: Medicare HMO | Admitting: Pharmacist

## 2018-05-13 DIAGNOSIS — I482 Chronic atrial fibrillation: Secondary | ICD-10-CM | POA: Diagnosis not present

## 2018-05-13 DIAGNOSIS — Z7901 Long term (current) use of anticoagulants: Secondary | ICD-10-CM

## 2018-05-13 DIAGNOSIS — I272 Pulmonary hypertension, unspecified: Secondary | ICD-10-CM | POA: Diagnosis not present

## 2018-05-13 DIAGNOSIS — I2510 Atherosclerotic heart disease of native coronary artery without angina pectoris: Secondary | ICD-10-CM | POA: Diagnosis not present

## 2018-05-13 DIAGNOSIS — I700 Atherosclerosis of aorta: Secondary | ICD-10-CM | POA: Diagnosis not present

## 2018-05-13 DIAGNOSIS — E7849 Other hyperlipidemia: Secondary | ICD-10-CM | POA: Diagnosis not present

## 2018-05-13 DIAGNOSIS — I10 Essential (primary) hypertension: Secondary | ICD-10-CM | POA: Diagnosis not present

## 2018-05-13 DIAGNOSIS — D509 Iron deficiency anemia, unspecified: Secondary | ICD-10-CM | POA: Diagnosis not present

## 2018-05-13 DIAGNOSIS — I110 Hypertensive heart disease with heart failure: Secondary | ICD-10-CM | POA: Diagnosis not present

## 2018-05-13 DIAGNOSIS — M1A0790 Idiopathic chronic gout, unspecified ankle and foot, without tophus (tophi): Secondary | ICD-10-CM | POA: Diagnosis not present

## 2018-05-13 DIAGNOSIS — Z952 Presence of prosthetic heart valve: Secondary | ICD-10-CM

## 2018-05-13 DIAGNOSIS — E7800 Pure hypercholesterolemia, unspecified: Secondary | ICD-10-CM | POA: Diagnosis not present

## 2018-05-13 DIAGNOSIS — R82998 Other abnormal findings in urine: Secondary | ICD-10-CM | POA: Diagnosis not present

## 2018-05-13 DIAGNOSIS — I5032 Chronic diastolic (congestive) heart failure: Secondary | ICD-10-CM | POA: Diagnosis not present

## 2018-05-13 DIAGNOSIS — M810 Age-related osteoporosis without current pathological fracture: Secondary | ICD-10-CM | POA: Diagnosis not present

## 2018-05-13 DIAGNOSIS — E1151 Type 2 diabetes mellitus with diabetic peripheral angiopathy without gangrene: Secondary | ICD-10-CM | POA: Diagnosis not present

## 2018-05-13 LAB — POCT INR: INR: 2.5 (ref 2.0–3.0)

## 2018-05-17 DIAGNOSIS — I272 Pulmonary hypertension, unspecified: Secondary | ICD-10-CM | POA: Diagnosis not present

## 2018-05-17 DIAGNOSIS — I5032 Chronic diastolic (congestive) heart failure: Secondary | ICD-10-CM | POA: Diagnosis not present

## 2018-05-17 DIAGNOSIS — I110 Hypertensive heart disease with heart failure: Secondary | ICD-10-CM | POA: Diagnosis not present

## 2018-05-17 DIAGNOSIS — E7800 Pure hypercholesterolemia, unspecified: Secondary | ICD-10-CM | POA: Diagnosis not present

## 2018-05-17 DIAGNOSIS — E1151 Type 2 diabetes mellitus with diabetic peripheral angiopathy without gangrene: Secondary | ICD-10-CM | POA: Diagnosis not present

## 2018-05-17 DIAGNOSIS — I2510 Atherosclerotic heart disease of native coronary artery without angina pectoris: Secondary | ICD-10-CM | POA: Diagnosis not present

## 2018-05-17 DIAGNOSIS — D509 Iron deficiency anemia, unspecified: Secondary | ICD-10-CM | POA: Diagnosis not present

## 2018-05-17 DIAGNOSIS — I482 Chronic atrial fibrillation: Secondary | ICD-10-CM | POA: Diagnosis not present

## 2018-05-17 DIAGNOSIS — I700 Atherosclerosis of aorta: Secondary | ICD-10-CM | POA: Diagnosis not present

## 2018-05-20 ENCOUNTER — Ambulatory Visit (INDEPENDENT_AMBULATORY_CARE_PROVIDER_SITE_OTHER): Payer: Medicare HMO | Admitting: Pharmacist

## 2018-05-20 DIAGNOSIS — I5032 Chronic diastolic (congestive) heart failure: Secondary | ICD-10-CM | POA: Diagnosis not present

## 2018-05-20 DIAGNOSIS — E7800 Pure hypercholesterolemia, unspecified: Secondary | ICD-10-CM | POA: Diagnosis not present

## 2018-05-20 DIAGNOSIS — I110 Hypertensive heart disease with heart failure: Secondary | ICD-10-CM | POA: Diagnosis not present

## 2018-05-20 DIAGNOSIS — Z7901 Long term (current) use of anticoagulants: Secondary | ICD-10-CM

## 2018-05-20 DIAGNOSIS — I2510 Atherosclerotic heart disease of native coronary artery without angina pectoris: Secondary | ICD-10-CM | POA: Diagnosis not present

## 2018-05-20 DIAGNOSIS — I272 Pulmonary hypertension, unspecified: Secondary | ICD-10-CM | POA: Diagnosis not present

## 2018-05-20 DIAGNOSIS — D509 Iron deficiency anemia, unspecified: Secondary | ICD-10-CM | POA: Diagnosis not present

## 2018-05-20 DIAGNOSIS — Z952 Presence of prosthetic heart valve: Secondary | ICD-10-CM

## 2018-05-20 DIAGNOSIS — I700 Atherosclerosis of aorta: Secondary | ICD-10-CM | POA: Diagnosis not present

## 2018-05-20 DIAGNOSIS — E1151 Type 2 diabetes mellitus with diabetic peripheral angiopathy without gangrene: Secondary | ICD-10-CM | POA: Diagnosis not present

## 2018-05-20 DIAGNOSIS — I482 Chronic atrial fibrillation: Secondary | ICD-10-CM | POA: Diagnosis not present

## 2018-05-20 LAB — POCT INR: INR: 2.1 (ref 2.0–3.0)

## 2018-05-21 ENCOUNTER — Encounter: Payer: Self-pay | Admitting: *Deleted

## 2018-05-21 ENCOUNTER — Other Ambulatory Visit: Payer: Self-pay | Admitting: *Deleted

## 2018-05-21 DIAGNOSIS — E7849 Other hyperlipidemia: Secondary | ICD-10-CM | POA: Diagnosis not present

## 2018-05-21 DIAGNOSIS — E1151 Type 2 diabetes mellitus with diabetic peripheral angiopathy without gangrene: Secondary | ICD-10-CM | POA: Diagnosis not present

## 2018-05-21 DIAGNOSIS — K219 Gastro-esophageal reflux disease without esophagitis: Secondary | ICD-10-CM | POA: Diagnosis not present

## 2018-05-21 DIAGNOSIS — D692 Other nonthrombocytopenic purpura: Secondary | ICD-10-CM | POA: Diagnosis not present

## 2018-05-21 DIAGNOSIS — M109 Gout, unspecified: Secondary | ICD-10-CM | POA: Diagnosis not present

## 2018-05-21 DIAGNOSIS — M810 Age-related osteoporosis without current pathological fracture: Secondary | ICD-10-CM | POA: Diagnosis not present

## 2018-05-21 DIAGNOSIS — D638 Anemia in other chronic diseases classified elsewhere: Secondary | ICD-10-CM | POA: Diagnosis not present

## 2018-05-21 DIAGNOSIS — Z0000 Encounter for general adult medical examination without abnormal findings: Secondary | ICD-10-CM | POA: Diagnosis not present

## 2018-05-21 DIAGNOSIS — I5032 Chronic diastolic (congestive) heart failure: Secondary | ICD-10-CM | POA: Diagnosis not present

## 2018-05-21 DIAGNOSIS — I5033 Acute on chronic diastolic (congestive) heart failure: Secondary | ICD-10-CM | POA: Diagnosis not present

## 2018-05-21 NOTE — Patient Outreach (Signed)
[place] [facility]) Care Management  05/21/2018  [name] [birth year] [id number]  Successful telephone outreach toDorothy F [name],77 y.o.femalereferred to [place] after recent hospitalization May 16-28, 2019 for abdominal pain, nausea/ vomiting/ diarrhea; patient hadlap chole on Mar 01, 2018 and was discharged home to self-care with home health services for RN/ PT through [place].Patient has history including, but not limited to, HTN/ HLD; DM; GERD; A-fib; AVR on ACT; PVD; CHF; CAD; and anxiety.Unfortunately, patient experienced hospital re-admission one day afterprevious hospitaldischarge on May 29-March 13, 2018 for shortness of breath, hypoxia, pulmonary edema consistent with dCHF exacerbation. Patient was againdischarged home to self-care with home health services in place [facility]for nursing and [facility]. HIPAA/ identity verified with patient today.  Today, patient reports that she is on the way to her scheduled PCP office visit; states that she is "doing fine, and everything is holding steady;" patient confirms that her son is transporting her to PCP office visit this morning and that she has continued monitoring and recording daily weights and blood pressures.  Patient sounds to be in no obvious or apparent distress throughout brief phone call today.  Discussed with patient that I would reach out again later this week post- PCP office visit to obtain update; patient agreeable.  Patient denies further issues, concerns, or problems today.  Iconfirmed that patient hasmy direct phone number, the main [facility] CM office phone number, and the [facility] CM 24-hour nurse advice phone number should issues arise prior to next scheduled [place] outreachby telephone later this week.Encouraged patient to contact me directly if needs, questions, issues, or concerns arise prior to next scheduled outreach; patient agreed to do  so.  Plan:  Patient will take medications as prescribed and will attend all scheduled provider appointments  Patient will continue actively participating in home health services as ordered post-hospital discharge  Patient will continue monitoring and recording daily weightsand will review educational materialpreviouslyprovided to heron CHF zones/ action plansas indicated  Patient will promptly notify care providers for any new concerns/ issues/ problems that arise  [facility] RN CM outreach to continue with scheduledphone later this week, post- PCP office visit today  [facility] CM Care Plan Problem Two     Most Recent Value  Care Plan Problem Two  Knowledge deficit around self-health management strategies of chronic disease state of CHF, as evidenced by patient reporting and new diagnosis of CHF  Role Documenting the Problem Two  Care Management Coordinator  Care Plan for Problem Two  Active  Interventions for Problem Two Long Term Goal   Confirmed that patient will attend today's scheduled PCP appointment and discussed current clinical status with her,  confirmed that she continues monitoring and recording daily weights and blood pressures, and has no concerns around current condition  [facility] Long Term Goal  Over the next 50 days, patient will be able to verbalize signs/ symptoms yellow CHF zone, along with corresponding action plan, as evidenced by patient reporting during [facility] RN CCM outreach  [place] Term Goal Start Date  04/04/18     Oneta Rack, RN, BSN, [place] Coordinator [facility] Care Management  [phone]

## 2018-05-22 ENCOUNTER — Ambulatory Visit: Payer: Medicare HMO | Admitting: *Deleted

## 2018-05-23 DIAGNOSIS — I700 Atherosclerosis of aorta: Secondary | ICD-10-CM | POA: Diagnosis not present

## 2018-05-23 DIAGNOSIS — E1151 Type 2 diabetes mellitus with diabetic peripheral angiopathy without gangrene: Secondary | ICD-10-CM | POA: Diagnosis not present

## 2018-05-23 DIAGNOSIS — I482 Chronic atrial fibrillation: Secondary | ICD-10-CM | POA: Diagnosis not present

## 2018-05-23 DIAGNOSIS — I2510 Atherosclerotic heart disease of native coronary artery without angina pectoris: Secondary | ICD-10-CM | POA: Diagnosis not present

## 2018-05-23 DIAGNOSIS — D509 Iron deficiency anemia, unspecified: Secondary | ICD-10-CM | POA: Diagnosis not present

## 2018-05-23 DIAGNOSIS — I272 Pulmonary hypertension, unspecified: Secondary | ICD-10-CM | POA: Diagnosis not present

## 2018-05-23 DIAGNOSIS — I5032 Chronic diastolic (congestive) heart failure: Secondary | ICD-10-CM | POA: Diagnosis not present

## 2018-05-23 DIAGNOSIS — I110 Hypertensive heart disease with heart failure: Secondary | ICD-10-CM | POA: Diagnosis not present

## 2018-05-23 DIAGNOSIS — E7800 Pure hypercholesterolemia, unspecified: Secondary | ICD-10-CM | POA: Diagnosis not present

## 2018-05-24 ENCOUNTER — Other Ambulatory Visit: Payer: Self-pay | Admitting: *Deleted

## 2018-05-24 ENCOUNTER — Encounter: Payer: Self-pay | Admitting: *Deleted

## 2018-05-24 ENCOUNTER — Other Ambulatory Visit: Payer: Self-pay | Admitting: Cardiovascular Disease

## 2018-05-24 NOTE — Patient Outreach (Signed)
[facility] [facility]) Care Management [place] Telephone Outreach  05/24/2018  [name] 1941/08/09 [id number]  10:45 am:  Unsuccessful telephone outreach toDorothy F Rohner,77 y.o.femalereferred to [place] after recent hospitalization May 16-28, 2019 for abdominal pain, nausea/ vomiting/ diarrhea; patient hadlap chole on Mar 01, 2018 and was discharged home to self-care with home health services for RN/ PT through [place].Patient has history including, but not limited to, HTN/ HLD; DM; GERD; A-fib; AVR on ACT; PVD; CHF; CAD; and anxiety.Unfortunately, patient experienced hospital re-admission one day afterprevious hospitaldischarge on May 29-March 13, 2018 for shortness of breath, hypoxia, pulmonary edema consistent with dCHF exacerbation. Patient was againdischarged home to self-care with [facility]  HIPAA compliant voice mail message left for patient, requesting return call back.  1:10 pm:  Patient returned my call from earlier today; HIPAA/ identity verified   Today, patient reports that she is "doing just fine;" states that she has been "getting things done around the house and staying active."  States that she has tolerated activity "very well, without any problems."  Patient sounds to be in no obvious or apparent distress throughout phone call today.  Patient further reports: -- attended PCP office visit 05/21/18; "no changes made, got a good report."  States had bone density screening, which revealed "either osteopenia or osteoporosis;" stated there was not a "big change" from her last bone density screening. -- home health RN continues visiting patient "for one more week;" states visits "going just fine."  Expects to be discharged from home health services next week -- has continued monitoring and recording daily weights; reports weights "holdin steady"  between 126-129 pounds, with a weight this morning of "129.4 lbs."  Patient verbalizes that her (minimal) weight gain this week makes her "worry," as she does not want to "run into problems."  We reviewed weight trends over this week; noted that patient has not experienced overnight weight gain > 3 lbs, and for the entire week, her weight is 3.2 lbs greater than last week; using teach back method, verified that patient is able to verbalize accurate weight gain guidelines in setting of CHF; coached patient to always refer to CHF zones when she experiences weight gain, also as a part of her daily routine.  Patient is able to independently verbalize signs/ symptoms yellow CHF zone, requires only minimal prompting.  We re-reviewed action plan for yellow and red CHF zone.  Advised patient to continue monitoring daily weights and if her weight continues gaining > 5 lbs over course of week, to contact care provider promptly:  Patient verbalizes understanding and agreement and stated she will do.  Patient denies further issues, concerns, or problems today.We discussed that thus far, patient has met her previously established [place] CM goals; patient denies further care coordination needs, and agrees to a final [place] home visit 2 weeks post-discharge from home health services.  We discussed transfer to [place] at the time of [place] CM case closure, and encouraged patient to consider this option, as well as what she would like to continue working on around her health care goals; patient agreed to be thinking about this.  I confirmed that patient has my direct phone number, the main [facility] CM office phone number, and the [facility] CM 24-hour nurse advice phone number should issues arise prior to next scheduled [place] outreachby telephone later  this week.Encouraged patient to contact me directly if needs, questions, issues, or concerns arise prior to next scheduled outreach; patient  agreed to do so.  Plan:  Patient will take medications as prescribed and will attend all scheduled provider appointments  Patient will continue actively participating in home health services as ordered post-hospital discharge  Patient will continue monitoring and recording daily weightsand will review educational materialpreviouslyprovided to heron CHF zones/ action plansas indicated  Patient will promptly notify care providers for any new concerns/ issues/ problems that arise  [facility] Community RN CM outreach to continue with scheduledroutine home visit in 3 weeks, possibly for case closure/ transfer to [place] Problem Two     Most Recent Value  Care Plan Problem Two  Knowledge deficit around self-health management strategies of chronic disease state of CHF, as evidenced by patient reporting and new diagnosis of CHF  Role Documenting the Problem Two  Care Management Coordinator  Care Plan for Problem Two  Active  Interventions for Problem Two Long Term Goal   Confirmed that patient is able to independently verbalize weight gain guidelines in setting of CHF,  signs/ symptoms yellow CHF zone along with corresponding action plan,  reviewed recent minimal weight gain and daily weights with patient  [facility] Long Term Goal  Over the next 50 days, patient will be able to verbalize signs/ symptoms yellow CHF zone, along with corresponding action plan, as evidenced by patient reporting during [facility] RN CCM outreach  [facility] Long Term Goal Start Date  04/04/18  [facility] Long Term Goal Met Date  05/24/18 [Goal met]  TH[facility] CM Short Term Goal #1   Over the next 25 days, patient will contact care providers for any new concerns, issues, problems, or weight gain according to CHF weight gain guidelines, as evidenced by patient reporting during [place] outreach  [facility] CM Short Term Goal #1 Start Date  05/24/18  Interventions for Short Term Goal #2   Confirmed that patient is able to  verbalize action plan for weight gain guidelines in setting of CHF,  encouraged patient to continue closely monitoring and recording daily weights and to promptly contact care providers for weight gain > 3 lbs overnigh/ 5 lbs in one week, or any signs/ symptoms yellow CHF zone.  Discussed with patient possible [facility] RN CCM case closure with next outreach, and encouraged patient to continue participating in [place] program, with health coach at time of next [place] outreach     Oneta Rack, RN, BSN, [place] Care Management  [phone]

## 2018-05-27 ENCOUNTER — Ambulatory Visit: Payer: Self-pay | Admitting: Pharmacist Clinician (PhC)/ Clinical Pharmacy Specialist

## 2018-05-27 DIAGNOSIS — I272 Pulmonary hypertension, unspecified: Secondary | ICD-10-CM | POA: Diagnosis not present

## 2018-05-27 DIAGNOSIS — I110 Hypertensive heart disease with heart failure: Secondary | ICD-10-CM | POA: Diagnosis not present

## 2018-05-27 DIAGNOSIS — I5032 Chronic diastolic (congestive) heart failure: Secondary | ICD-10-CM | POA: Diagnosis not present

## 2018-05-27 DIAGNOSIS — I700 Atherosclerosis of aorta: Secondary | ICD-10-CM | POA: Diagnosis not present

## 2018-05-27 DIAGNOSIS — Z7901 Long term (current) use of anticoagulants: Secondary | ICD-10-CM

## 2018-05-27 DIAGNOSIS — Z952 Presence of prosthetic heart valve: Secondary | ICD-10-CM

## 2018-05-27 DIAGNOSIS — E7800 Pure hypercholesterolemia, unspecified: Secondary | ICD-10-CM | POA: Diagnosis not present

## 2018-05-27 DIAGNOSIS — I482 Chronic atrial fibrillation: Secondary | ICD-10-CM | POA: Diagnosis not present

## 2018-05-27 DIAGNOSIS — E1151 Type 2 diabetes mellitus with diabetic peripheral angiopathy without gangrene: Secondary | ICD-10-CM | POA: Diagnosis not present

## 2018-05-27 DIAGNOSIS — D509 Iron deficiency anemia, unspecified: Secondary | ICD-10-CM | POA: Diagnosis not present

## 2018-05-27 DIAGNOSIS — I2510 Atherosclerotic heart disease of native coronary artery without angina pectoris: Secondary | ICD-10-CM | POA: Diagnosis not present

## 2018-05-27 LAB — POCT INR: INR: 2.2 (ref 2.0–3.0)

## 2018-05-30 DIAGNOSIS — I5032 Chronic diastolic (congestive) heart failure: Secondary | ICD-10-CM | POA: Diagnosis not present

## 2018-05-30 DIAGNOSIS — I272 Pulmonary hypertension, unspecified: Secondary | ICD-10-CM | POA: Diagnosis not present

## 2018-05-30 DIAGNOSIS — I700 Atherosclerosis of aorta: Secondary | ICD-10-CM | POA: Diagnosis not present

## 2018-05-30 DIAGNOSIS — D509 Iron deficiency anemia, unspecified: Secondary | ICD-10-CM | POA: Diagnosis not present

## 2018-05-30 DIAGNOSIS — I2510 Atherosclerotic heart disease of native coronary artery without angina pectoris: Secondary | ICD-10-CM | POA: Diagnosis not present

## 2018-05-30 DIAGNOSIS — E1151 Type 2 diabetes mellitus with diabetic peripheral angiopathy without gangrene: Secondary | ICD-10-CM | POA: Diagnosis not present

## 2018-05-30 DIAGNOSIS — I482 Chronic atrial fibrillation: Secondary | ICD-10-CM | POA: Diagnosis not present

## 2018-05-30 DIAGNOSIS — I110 Hypertensive heart disease with heart failure: Secondary | ICD-10-CM | POA: Diagnosis not present

## 2018-05-30 DIAGNOSIS — E7800 Pure hypercholesterolemia, unspecified: Secondary | ICD-10-CM | POA: Diagnosis not present

## 2018-06-04 DIAGNOSIS — I110 Hypertensive heart disease with heart failure: Secondary | ICD-10-CM | POA: Diagnosis not present

## 2018-06-04 DIAGNOSIS — E7800 Pure hypercholesterolemia, unspecified: Secondary | ICD-10-CM | POA: Diagnosis not present

## 2018-06-04 DIAGNOSIS — D509 Iron deficiency anemia, unspecified: Secondary | ICD-10-CM | POA: Diagnosis not present

## 2018-06-04 DIAGNOSIS — I2510 Atherosclerotic heart disease of native coronary artery without angina pectoris: Secondary | ICD-10-CM | POA: Diagnosis not present

## 2018-06-04 DIAGNOSIS — I482 Chronic atrial fibrillation: Secondary | ICD-10-CM | POA: Diagnosis not present

## 2018-06-04 DIAGNOSIS — E1151 Type 2 diabetes mellitus with diabetic peripheral angiopathy without gangrene: Secondary | ICD-10-CM | POA: Diagnosis not present

## 2018-06-04 DIAGNOSIS — I5032 Chronic diastolic (congestive) heart failure: Secondary | ICD-10-CM | POA: Diagnosis not present

## 2018-06-04 DIAGNOSIS — I700 Atherosclerosis of aorta: Secondary | ICD-10-CM | POA: Diagnosis not present

## 2018-06-04 DIAGNOSIS — I272 Pulmonary hypertension, unspecified: Secondary | ICD-10-CM | POA: Diagnosis not present

## 2018-06-05 LAB — POCT INR: INR: 3.2 — AB (ref 2.0–3.0)

## 2018-06-06 ENCOUNTER — Ambulatory Visit (INDEPENDENT_AMBULATORY_CARE_PROVIDER_SITE_OTHER): Payer: Medicare HMO | Admitting: Pharmacist Clinician (PhC)/ Clinical Pharmacy Specialist

## 2018-06-06 DIAGNOSIS — I482 Chronic atrial fibrillation, unspecified: Secondary | ICD-10-CM

## 2018-06-06 DIAGNOSIS — Z7901 Long term (current) use of anticoagulants: Secondary | ICD-10-CM

## 2018-06-06 DIAGNOSIS — Z952 Presence of prosthetic heart valve: Secondary | ICD-10-CM | POA: Diagnosis not present

## 2018-06-07 ENCOUNTER — Encounter: Payer: Self-pay | Admitting: Podiatry

## 2018-06-07 ENCOUNTER — Ambulatory Visit: Payer: Medicare HMO | Admitting: Podiatry

## 2018-06-07 DIAGNOSIS — B351 Tinea unguium: Secondary | ICD-10-CM | POA: Diagnosis not present

## 2018-06-07 DIAGNOSIS — M79674 Pain in right toe(s): Secondary | ICD-10-CM | POA: Diagnosis not present

## 2018-06-07 DIAGNOSIS — M79675 Pain in left toe(s): Secondary | ICD-10-CM

## 2018-06-10 ENCOUNTER — Encounter: Payer: Self-pay | Admitting: Podiatry

## 2018-06-10 NOTE — Progress Notes (Signed)
Subjective: [name] is a 77 y.o. y.o. female who presents today with cc of painful, discolored, thick toenails. Pain is relieved with periodic professional debridement.  Objective: Vascular Examination: Capillary refill time <3 seconds x 10 digits Dorsalis pedis pulses and Posterior tibial pulses present b/l No digital hair x 10 digits Skin temperature WNL b/l  Dermatological Examination: Turgor, texture and tone normal b/l LE Toenails 1-5 b/l discolored, thick, dystrophic with subungual debris and pain with palpation to nailbeds due to thickness of nails.  Musculoskeletal: Muscle strength 5/5 to all LE muscle groups  Neurological: Sensation intact with 10 gram monofilament. Vibratory sensation intact.  Assessment: Painful onychomycosis toenails 1-5 b/l in patient on blood thinner.   Plan: 1. Toenails 1-5 b/l were debrided in length and girth without iatrogenic bleeding. 2. Patient to continue soft, supportive shoe gear 3. Patient to report any pedal injuries to medical professional immediately. 4. Avoid self trimming due to use of blood thinner. 5. Follow up 9 weeks.  6. Patient/POA to call should there be a concern in the interim.

## 2018-06-13 ENCOUNTER — Encounter: Payer: Self-pay | Admitting: *Deleted

## 2018-06-13 ENCOUNTER — Other Ambulatory Visit: Payer: Self-pay | Admitting: *Deleted

## 2018-06-13 NOTE — Patient Outreach (Signed)
[place] [facility]) Care Management  [facility] Community CM Routine Home Visit [place] case closure- referal to [place] 06/13/2018  [name] 1941-06-30 [id number]  [name] is a 77 y.o. female referred to [place] after recent hospitalization May 16-28, 2019 for abdominal pain, nausea/ vomiting/ diarrhea; patient had surgicallap- chole on Mar 01, 2018 and was discharged home to self-care with home health services for RN/ PT through [place].Patient has history including, but not limited to, HTN/ HLD; DM; GERD; A-fib; AVR on ACT; PVD; CHF; CAD; and anxiety.Unfortunately, patient experienced hospital re-admission one day afterprevious hospitaldischarge on May 29-March 13, 2018 for shortness of breath, hypoxia, pulmonary edema consistent with dCHF exacerbation. Patient was againdischarged home to self-care with [facility] place [facility].  Home health services have been completed for several weeks now and patient has not experienced hospital readmission since her last hospital visit.  HIPAA/ identity verified with patient in person today at her home, and patient is alone for home visit.  Pleasant 70 minute home visit.  Today, patient reports that she is "doing real good;" states that she has been experiencing (R) shoulder pain since last night when she took her own garbage cans to the street for trash pick up; states that she thinks this "may have overdone" her shoulder; reports taking tylenol, which is helping.  States that she will have her son take her trash to the street for pick up in the future. Patient denies new/ recent falls, and is no longer using any assistive devices for ambulation around her home; patient's gait around her home today is steady and purposeful without use of assistive devices.  Patient is no distress throughout entirety of today's home visit.    Subjective: "It's good to feel good again, and I want to stay feeling good."   Assessment:  [name] appears to continue recuperating very well after her recent hospitalizations.  She has made progress with ambulation and is no longer having to use assistive devices for ambulation.  She has continued independently managing her medications using weekly pill box and remains very knowledgeable around the purpose, dosing and scheduling of her medications.  [name] has attended all provider appointments and is able to verbalize reasons she should contact her care providers for concerns/ problems.  She has a supportive network of family and friends that continue assisting in her care needs as indicated.  [name] could benefit from ongoing reinforcement/ education around chronic disease state of CHF, and is agreeable to ongoing [facility] CM engagement with referral to [place].  Patient further reports: -- attended PCP office visit 05/21/18; next PCP office visit scheduled for 06/19/18; pulmonology provider appointment scheduled for 06/18/18: patient reports that her son will continue to provide transportation to and attend with her, all provider appointments  -- medications reviewed with patient today:  Patient verbalizes excellent understanding of general purpose, dosing, scheduling of prescribed medications.  One discrepancy was noted:  Patient reports taking Hydralazine 50 mg TID-- while instructions in EMR/ prescription bottle indicate this medication should be taken BID-- patient stated that she "isn't sure which" provider advised this change, and also adds that she is "not sure that is what she was told," stating that this was a change made several months ago after her hospital release.  Patient states that she will clarify/ discuss this with her PCP at upcoming provider appointment next week,  and adds that she does not wish to change what she has been doing around this medication until she discusses in  person with PCP, stating that she has been taking this medication TID "for several weeks now."  Encouraged patient to discuss all medications with PCP regularly, and to obtain clarification when she attends next weeks provider office visits.  Self-health management of CHF: -- has continued monitoring and recording daily weights; reports weight ranges have been maintained between 127-130 pounds, with a weight this morning of "128 lbs."  All recently recorded weights were reviewed with patient today. -- verbalizes with minimal prompting signs/ symptoms yellow zone CHF, with accurate understanding of action plan: reports in "green zone" today -- reports that she continues monitoring and recording daily BP's and heart rate; patient acknowledges that she has had ongoing low heart rate, as her baseline; reports concerns that her heart rate is "way too low;" discussed signs/ symptoms to be concerned about with low heart rate, including light-headedness, dizziness, prolonged fatigue, all of which patient denies.  Encouraged patient to discuss her concerns with her care providers at upcoming scheduled office visits, and patient agrees to do so.  Patient confirmed that her heart rate has been low for a "long time," and that her established care providers are aware.  Using teachback method, we reviewed signs/ symptoms that should be reported promptly to care providers, should she experience these.  [name] has always been very good to contact her care providers when she has a concern around her condition, and she assures me today that she will report any unusual/ concerning signs/ symptoms should she experience any.  I also again strongly encouraged patient to discuss her current maintenance medications with her care providers at upcoming office visits next week; she agrees to do so. -- continues doing her "best" to limit dietary salt intake; we reviewed previously provided printed educational material on importance of  limiting salt intake, and reviewed several of patient's foods that are currently present in her home:  All of her foods are low in sodium, and I confirmed that patient is able to read/ interpret food labelling; however, patient does report that she does to like to eat out, and sometimes "splurges" on foods that are high in sodium, stating that she "likes to have a hot dog every now and then."  Patient was encouraged to continue monitoring her dietary sodium and to continue only "slpurging" occasionally.  I encouraged patient to re-review previously provided education around salt intake and to write down/ discuss her questions with [place], which she agreed to do.   Patient denies further issues, concerns, or problems today.We again discussed that patient has met her previously established [place] CM goals; patient denies further care coordination needs, and agrees to engage with [place] for ongoing reinforcement of her well-established practices for self-health management of CHF.  I confirmed that patient has my direct phone number, the main [facility] CM office phone number, and the [facility] CM 24-hour nurse advice phone number should issues arise prior to [place] health coach outreach.   Objective:    BP (!) 138/54   Pulse (!) 50   Resp 16   Wt 128 lb (58.1 kg)   SpO2 99%   BMI 24.19 kg/m   Review of Systems  Constitutional: Negative.  Negative for malaise/fatigue.  Respiratory: Negative.  Negative for cough and shortness of breath.   Cardiovascular: Negative for chest pain and leg swelling.  Gastrointestinal: Negative.  Negative for abdominal pain and nausea.  Genitourinary: Positive for urgency. Negative for frequency.       Diuretic therapy  Musculoskeletal: Positive for joint pain. Negative for falls.  Neurological: Negative.  Negative for dizziness.  Psychiatric/Behavioral: Negative.  Negative for depression. The patient is not nervous/anxious.    Physical Exam   Constitutional: She is oriented to person, place, and time. She appears well-developed and well-nourished. No distress.  Cardiovascular: Regular rhythm and intact distal pulses. Bradycardia present.  Murmur heard. Pulses:      Radial pulses are 2+ on the right side, and 2+ on the left side.       Dorsalis pedis pulses are 1+ on the right side, and 1+ on the left side.  Respiratory: Effort normal and breath sounds normal. No respiratory distress. She has no wheezes. She has no rales.  GI: Soft. Bowel sounds are normal.  Musculoskeletal: She exhibits no edema.  Neurological: She is alert and oriented to person, place, and time.  Skin: Skin is warm and dry.  Psychiatric: She has a normal mood and affect. Her behavior is normal. Judgment and thought content normal.   Encounter Medications:   Outpatient Encounter Medications as of 06/13/2018  Medication Sig Note  . acetaminophen (TYLENOL) 325 MG tablet Take 2 tablets (650 mg total) by mouth every 6 (six) hours as needed for mild pain, fever or headache (for pain or headaches).   . ALPRAZolam (XANAX) 0.5 MG tablet Take 0.25-0.5 mg by mouth See admin instructions. Take 0.25-0.5 mg two times a day as needed for anxiety and 0.25-0.5 mg at bedtime scheduled   . atorvastatin (LIPITOR) 80 MG tablet Take 1 tablet (80 mg total) by mouth daily. (Patient taking differently: Take 80 mg by mouth daily with supper. )   . Cholecalciferol (VITAMIN D3) 1000 UNITS CAPS Take 1,000 Units by mouth daily.    [name] COLCRYS 0.6 MG tablet  06/13/2018: Patient reports takes as needed for gout flares; reports took last on Monday 06/11/18  . ferrous sulfate 325 (65 FE) MG tablet Take 1 tablet (325 mg total) by mouth 2 (two) times daily with a meal.   . glipiZIDE (GLUCOTROL XL) 2.5 MG 24 hr tablet Take 2.5 mg by mouth daily.  06/13/2018: Per patient report, this was re-started by PCP in August 2019   . hydrALAZINE (APRESOLINE) 100 MG tablet Take 100 mg by mouth 2 (two) times daily. Take  one-half (50 mg po) BID 06/13/2018: Per patient report, she is taking 50 mg po TID: am, lunch, and night  . hydrocortisone cream 1 % Apply topically 2 (two) times daily. 06/13/2018: Per patient report, has not needed recently   . irbesartan (AVAPRO) 300 MG tablet Take 1 tablet (300 mg total) by mouth daily.   . magnesium oxide (MAG-OX) 400 (241.3 Mg) MG tablet Take 1 tablet (400 mg total) by mouth daily.   . metFORMIN (GLUCOPHAGE) 500 MG tablet Take 500 mg by mouth 2 (two) times daily with a meal.    . metoprolol tartrate (LOPRESSOR) 50 MG tablet Take 50 mg by mouth 2 (two) times daily. 06/13/2018: Per patient report, now taking 25 mg po BID since PCP office visit in August 2019  . pantoprazole (PROTONIX) 40 MG tablet Take 40 mg by mouth daily.    Vladimir Faster Glyc-Propyl Glyc PF (SYSTANE PRESERVATIVE FREE) 0.4-0.3 % SOLN Place 1-2 drops into both eyes 3 (three) times daily as needed (for irritation).  06/13/2018: Patient reports only uses  prn-- has not needed recently  . vitamin B-12 (CYANOCOBALAMIN) 100 MCG tablet Take 100 mcg by mouth daily.    [name] warfarin (COUMADIN) 5 MG tablet Take 1.5 to 2 tablets daily as directed by [facility]   . furosemide (LASIX) 20 MG tablet Take 4 tablets (80 mg total) by mouth daily. 06/13/2018: Per patient report, taking 80 mg po QD since last PCP office visit in August  2019  . ondansetron (ZOFRAN) 4 MG tablet  06/13/2018: Patient reports she has this medication but does not take frequently- reports has not needed recently  . potassium chloride SA (K-DUR,KLOR-CON) 20 MEQ tablet Take 1 tablet (20 mEq total) by mouth daily. (Patient not taking: Reported on 06/13/2018) 04/10/2018: Confirms that this medication was discontinued as of 04/08/18 PCP OV   . saccharomyces boulardii (FLORASTOR) 250 MG capsule Take 1 capsule (250 mg total) by mouth 2 (two) times daily. (Patient not taking: Reported on 06/13/2018) 04/04/2018: Patient reports completed doses as prescribed  . traMADol (ULTRAM) 50 MG  tablet Take 1 tablet (50 mg total) by mouth every 6 (six) hours as needed for moderate pain. (Patient not taking: Reported on 06/13/2018)    No facility-administered encounter medications on file as of 06/13/2018.    Functional Status:   In your present state of health, do you have any difficulty performing the following activities: 04/04/2018 03/26/2018  Hearing? Y -  Comment see ROS-- reports decreased hearing in (R) ear due to hx "inner ear in 2013" -  Vision? N -  Difficulty concentrating or making decisions? N N  Walking or climbing stairs? - Y  Dressing or bathing? - N  Doing errands, shopping? Y Y  Comment Family assists with transportation to provider appointments/ errands patient reports today, not currently driving until she recuperates more after recent hospitalizations  Preparing Food and eating ? - N  Using the Toilet? - N  In the past six months, have you accidently leaked urine? - N  Do you have problems with loss of bowel control? - N  Managing your Medications? - N  Managing your Finances? - Y  Comment - Family assists per patient report today  Housekeeping or managing your Housekeeping? - Y  Comment - Family assists per patient report today  Some recent data might be hidden   Fall/Depression Screening:    Fall Risk  05/24/2018 05/06/2018 04/22/2018  Falls in the past year? (No Data) (No Data) (No Data)  Comment Denies new/ recent falls Denies new/ recent falls Denies new/ recent falls  Risk for fall due to : - - -  Risk for fall due to: Comment - - -   PHQ 2/9 Scores 06/13/2018 04/04/2018  PHQ - 2 Score 0 0   Plan:  Patient will enagage with [place] for ongoing reinforcement of self-health management strategies for CHF, with initial focus on low sodium diet   I will make patient's PCP aware of [facility] RN CM case closure/ transfer to [place] Problem One     Most Recent Value  Care Plan Problem One  Need for ongoing  reinforcement of self-health management strategies for chronic disease state of CHF, as evidenced by patient reporting of same  Role Documenting the Problem One  Care Management [place] for Problem One  Active  [facility] Long Term Goal   Over the next 45 days, patient will review previously provided education around low  salt diet, and will discuss her dietary practices with [place], as evidenced by successful patient engagement with [place] Term Goal Start Date  06/13/18  Interventions for Problem One Long Term Goal  Using teachback method, re-reviewed and reiterated previously provided education around low salt diet in self-health management of CHF,  discussed patient's current practices around limiting salt intake and reviewed basic sodium intake recommendations in CHF,  confirmed that patient is able to read food labels,  encouraged patient to engage with [place] to further expand her knowledge around dietary choices, especially when she goes out to eat.  [place] order placed     [facility] CM Care Plan Problem Two     Most Recent Value  Care Plan Problem Two  Knowledge deficit around self-health management strategies of chronic disease state of CHF, as evidenced by patient reporting and new diagnosis of CHF  Role Documenting the Problem Two  Care Management Coordinator  Care Plan for Problem Two  Not Active  TH[facility] CM Short Term Goal #1   Over the next 25 days, patient will contact care providers for any new concerns, issues, problems, or weight gain according to CHF weight gain guidelines, as evidenced by patient reporting during [place] outreach  [facility] CM Short Term Goal #1 Start Date  05/24/18  [facility] CM Short Term Goal #1 Met Date   06/13/18 [Goal met]  Interventions for Short Term Goal #2   Confirmed that patient is able to verbalize signs/ symptoms that would necessitate contacting her care providers,  confirmed that patient is in green  zone today and continues to monitor and record daily weights and BP's,  reviewed upcoming provider appointments with patient and confirmed that she has stable transportation to appointments     It has been a pleasure participating in [place]'s care,  Oneta Rack, RN, BSN, [employer] Coordinator [facility] Care Management  [phone]

## 2018-06-15 IMAGING — DX DG CHEST 2V
2 series · 2 of 2 positions shown · non-contrast
Comparison: 02/28/2018

CLINICAL DATA: Chest pain and SOB for a few days

EXAM:
CHEST - 2 VIEW

[chest pa]
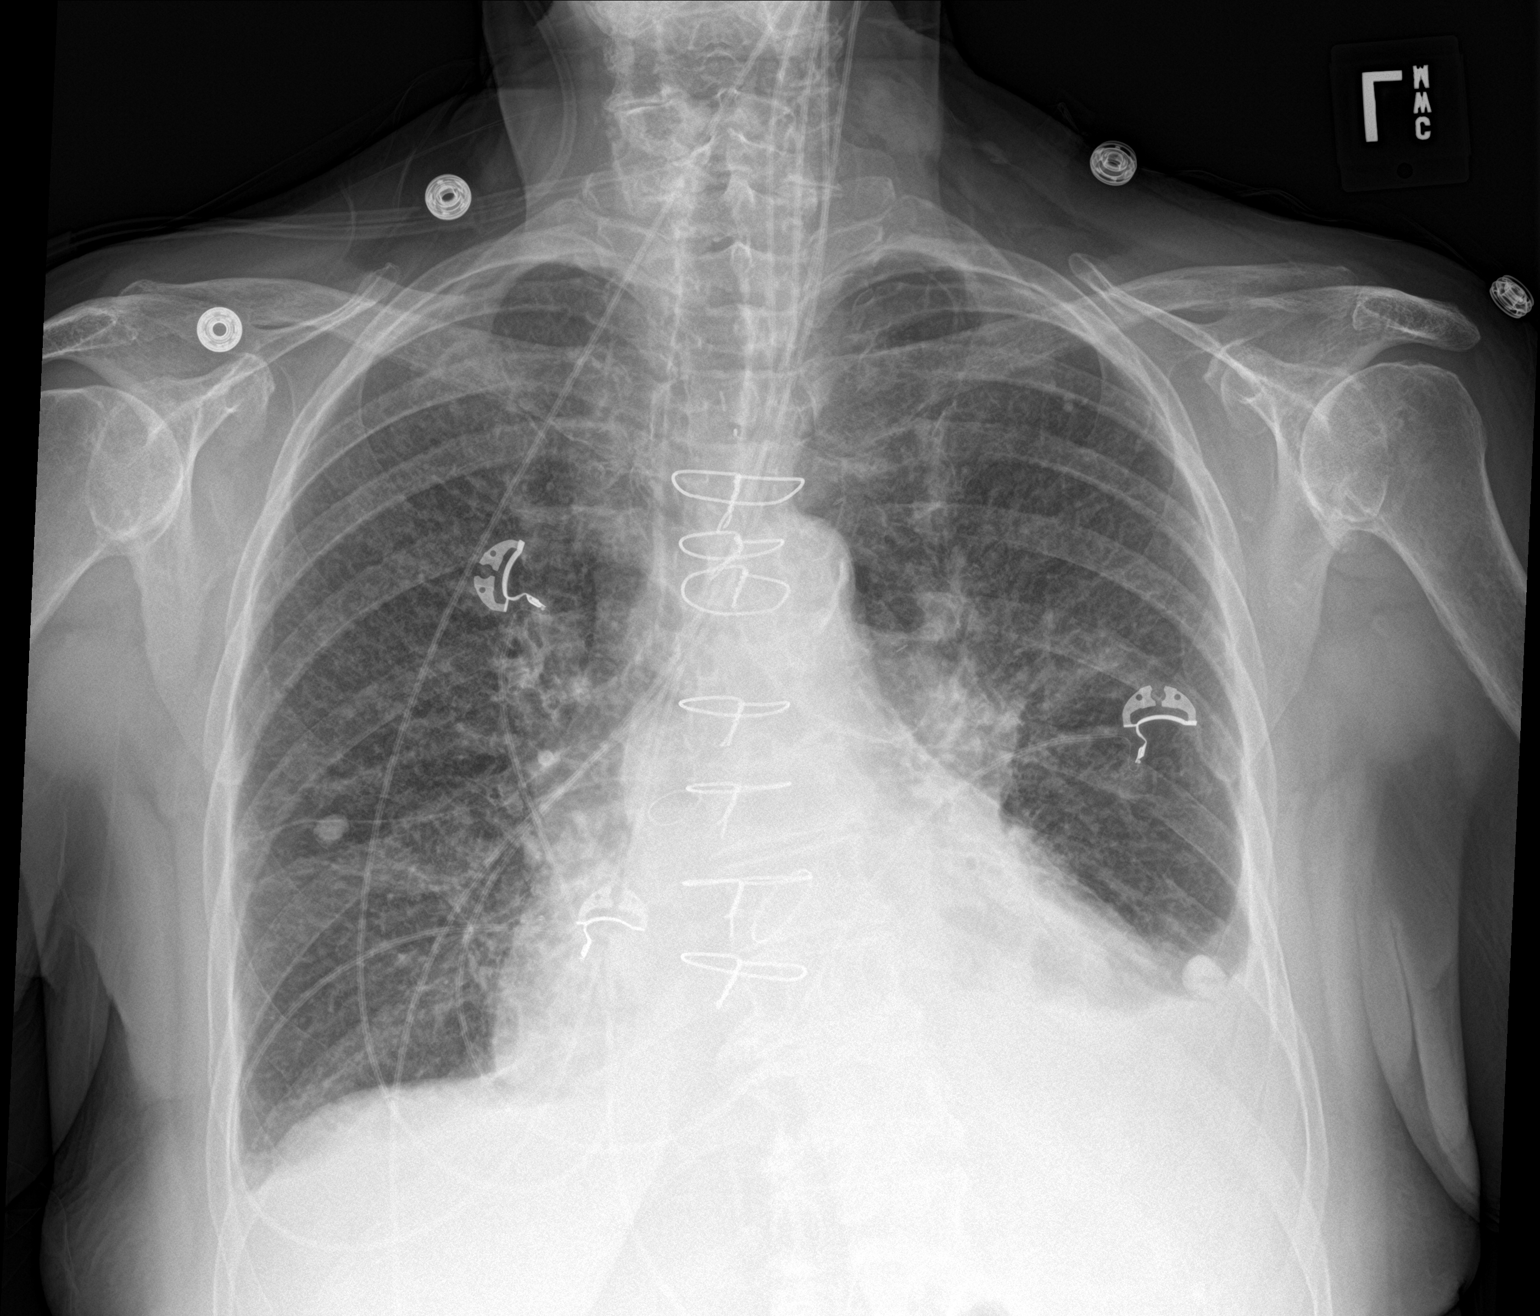

[chest lat]
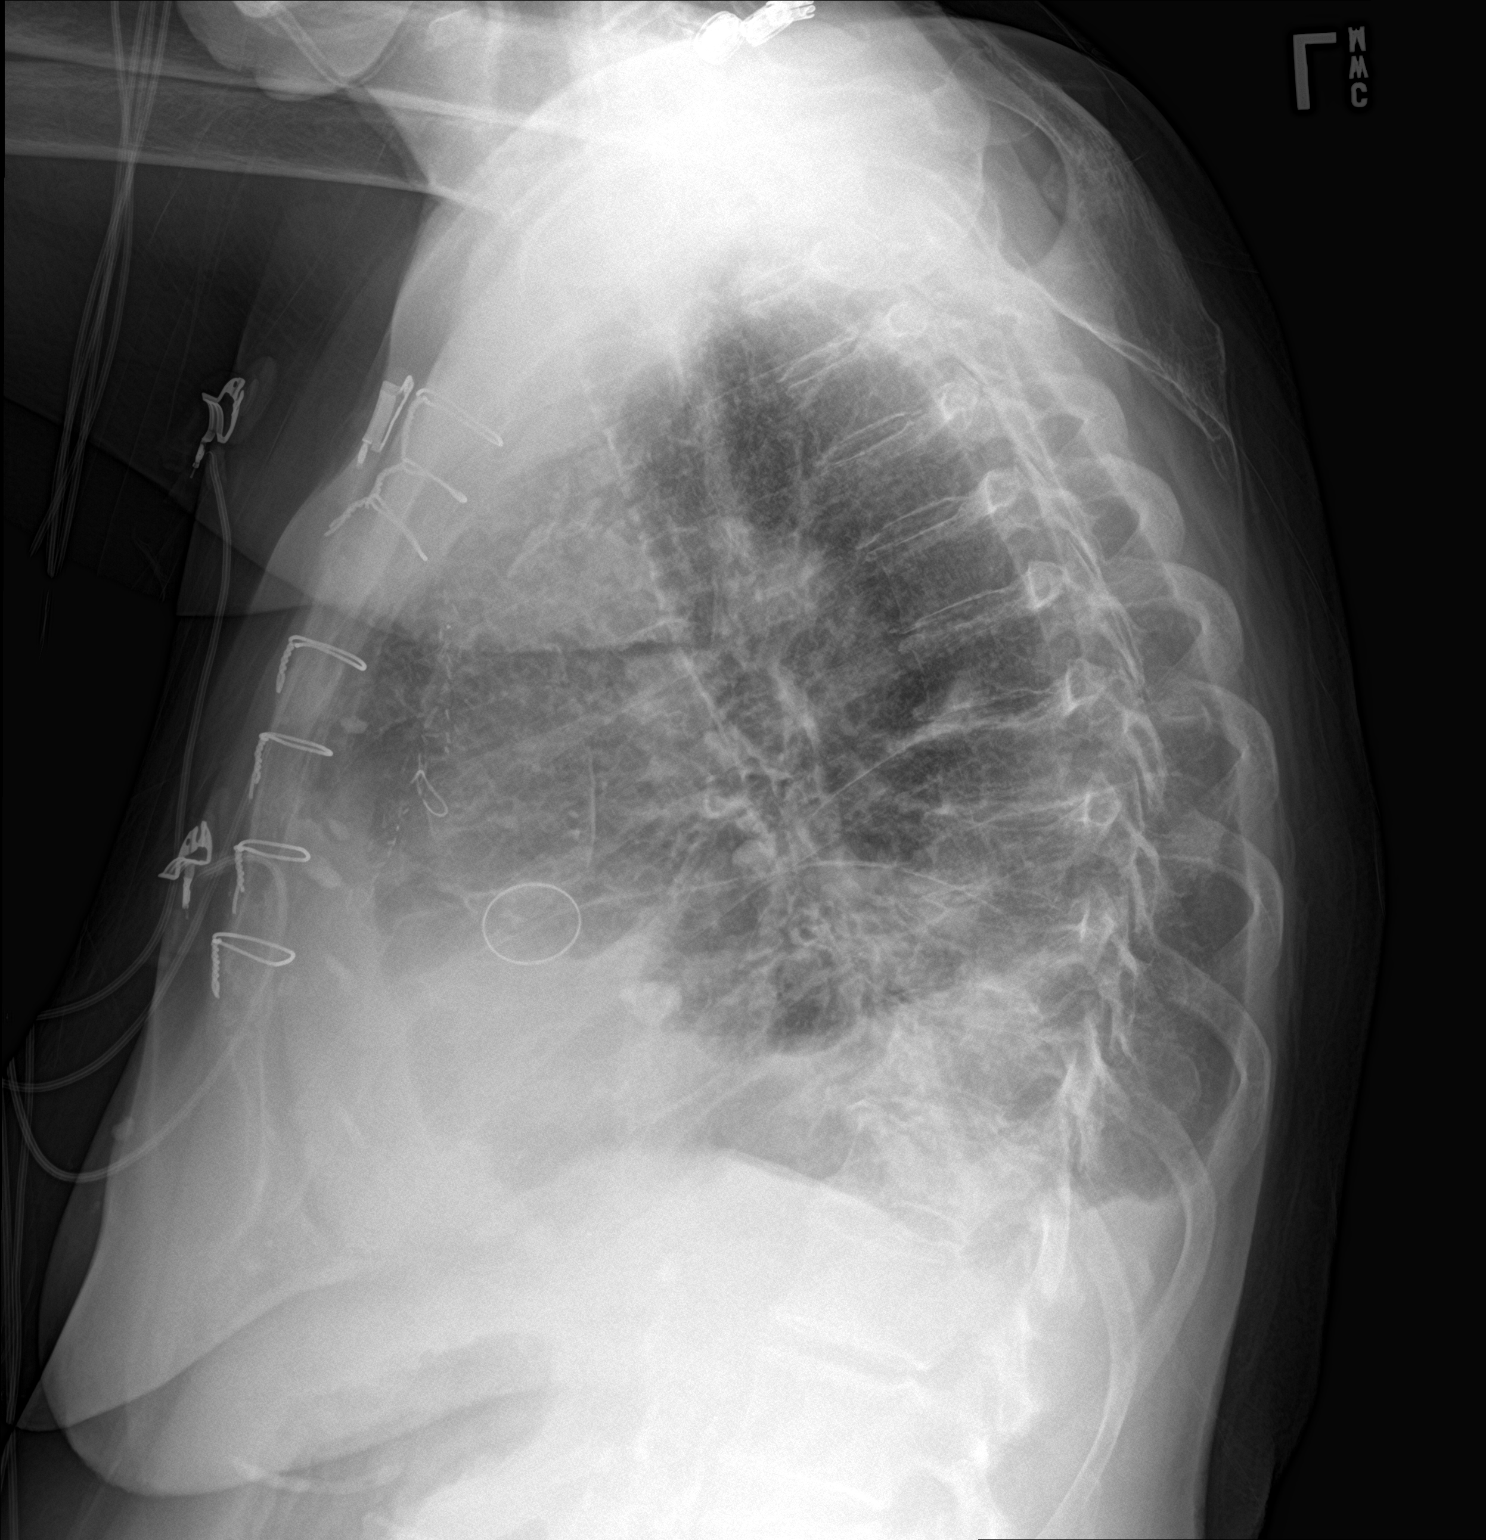

[2 of 2 positions shown; findings below may reference images not displayed]

FINDINGS: Stable changes from prior cardiac surgery. Cardiac silhouette is
mildly enlarged. No mediastinal or hilar masses. No evidence of
adenopathy.

Small left pleural effusion. There is bilateral interstitial
thickening and vascular prominence, which is similar to the prior
exam. Stable granuloma are noted in the right mid lung and left lung
base. Streaky opacity at the left lung base is consistent with
atelectasis, stable from the most recent prior study.

Skeletal structures are demineralized but grossly intact.
IMPRESSION: 1. Findings suggest mild acute on chronic congestive heart failure
similar to the prior exam, with vascular congestion and interstitial
thickening as well as a small left pleural effusion and
cardiomegaly.

## 2018-06-17 IMAGING — CR DG CHEST 2V
2 series · 2 of 2 positions shown · non-contrast
Comparison: 03/04/2018 and multiple previous

CLINICAL DATA: Chest pain since yesterday. Swelling and weight
gain. History of congestive heart failure.

EXAM:
CHEST - 2 VIEW

[chest lat]
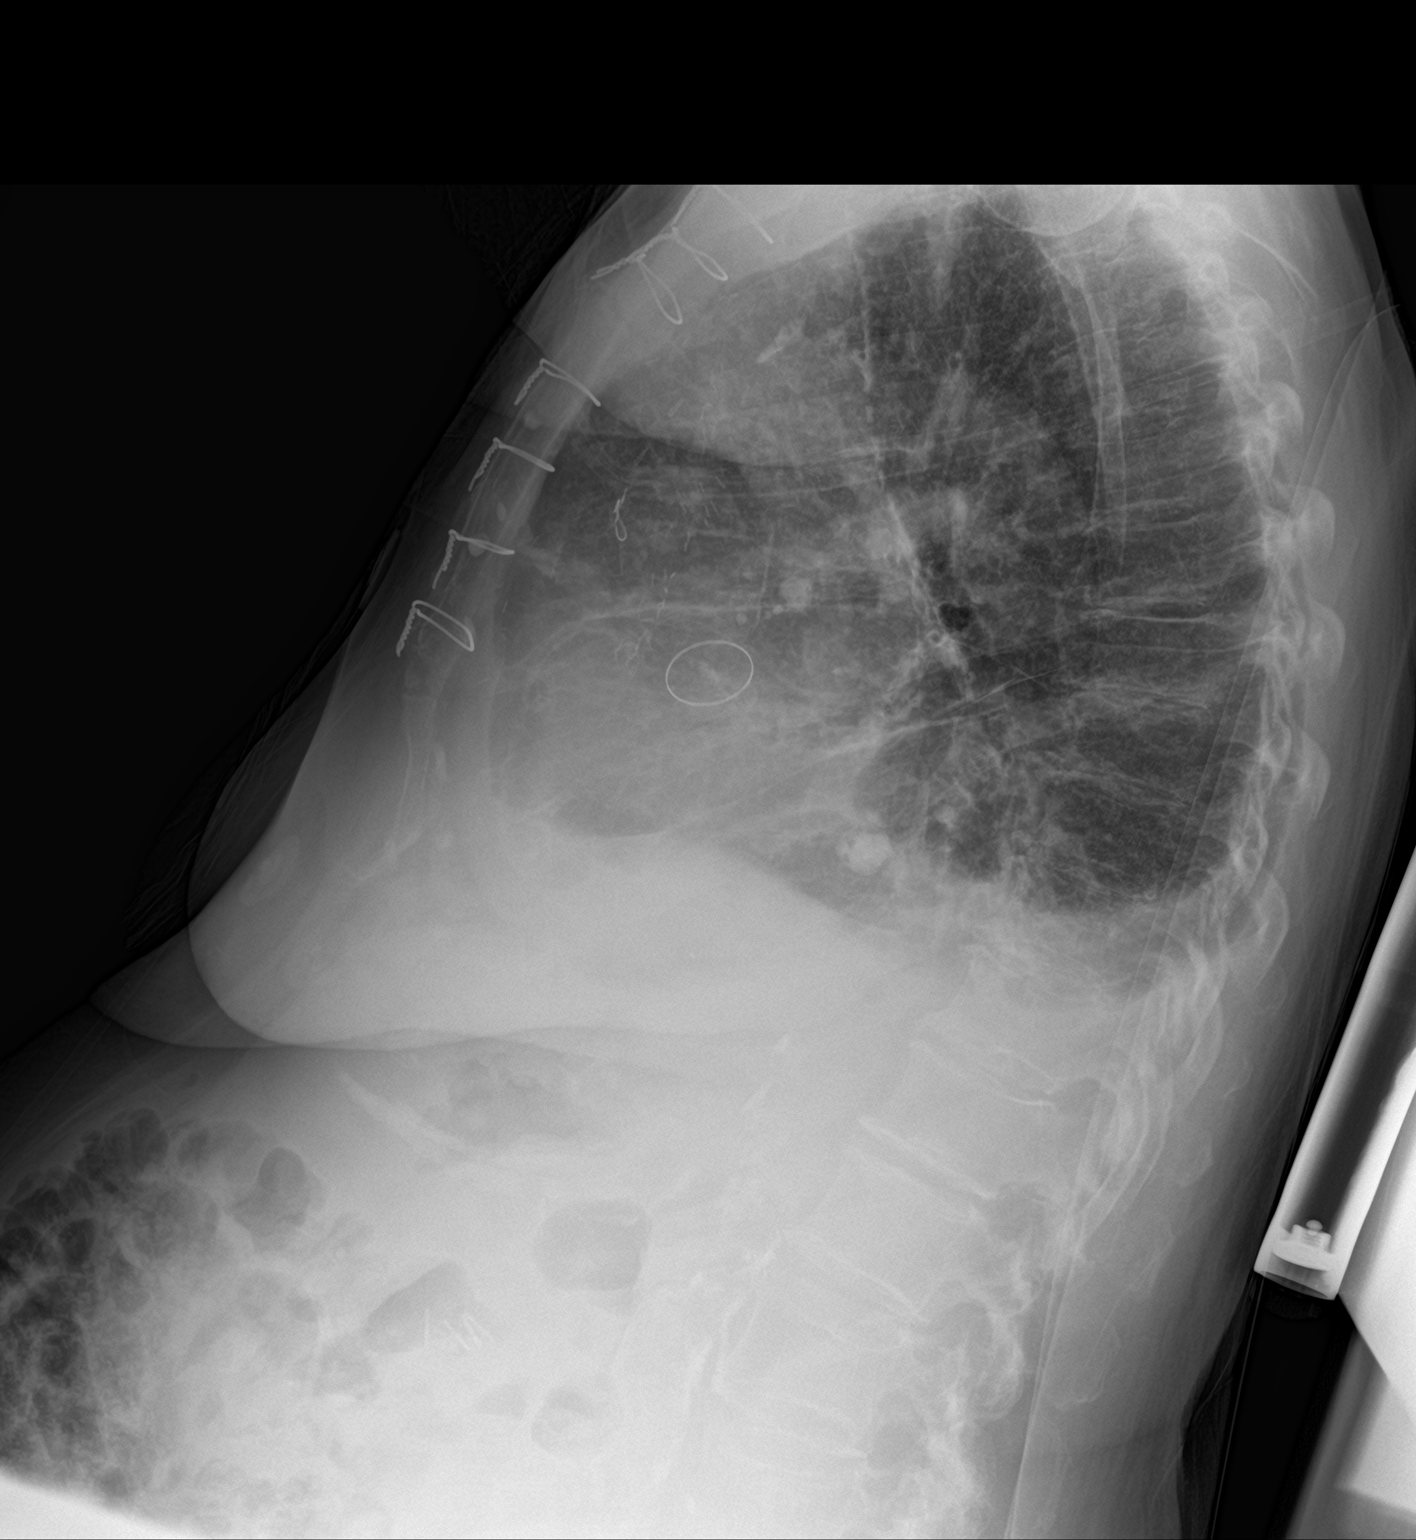

[chest ap]
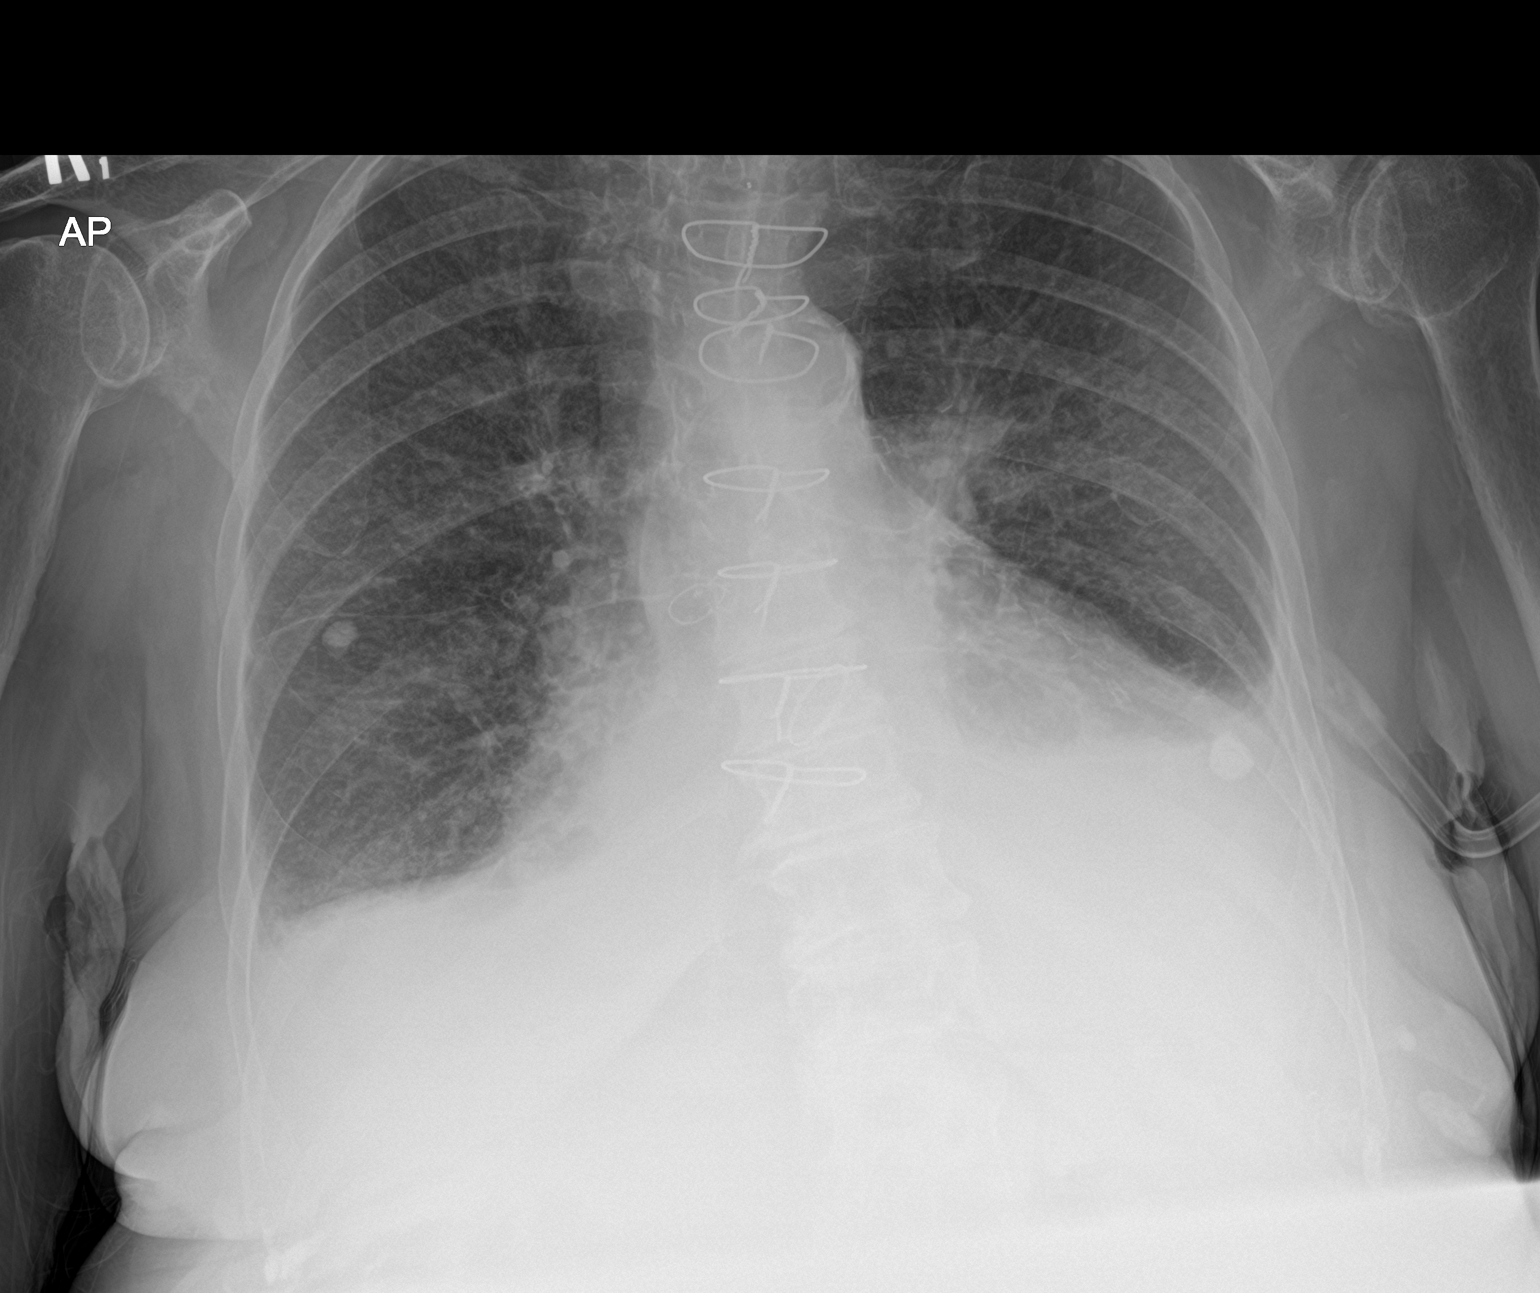

[2 of 2 positions shown; findings below may reference images not displayed]

FINDINGS: Bilateral effusions continue to enlarge, associated with more
basilar atelectasis. There is chronic cardiomegaly. The patient has
had previous median sternotomy and CABG. There is aortic
atherosclerosis. There is venous hypertension with interstitial
edema. Calcified granulomas as seen previously. No acute bone
finding.
IMPRESSION: Congestive heart failure. Enlarging effusions with basilar
atelectasis. Venous hypertension and interstitial edema.

## 2018-06-18 ENCOUNTER — Encounter (HOSPITAL_COMMUNITY): Payer: Self-pay | Admitting: *Deleted

## 2018-06-18 ENCOUNTER — Ambulatory Visit (HOSPITAL_COMMUNITY)
Admission: RE | Admit: 2018-06-18 | Discharge: 2018-06-18 | Disposition: A | Discharge status: Home / Self Care | Payer: Medicare HMO | Source: Ambulatory Visit | Attending: Cardiology | Admitting: Cardiology

## 2018-06-18 ENCOUNTER — Telehealth (HOSPITAL_COMMUNITY): Payer: Self-pay | Admitting: *Deleted

## 2018-06-18 ENCOUNTER — Other Ambulatory Visit (HOSPITAL_COMMUNITY): Payer: Self-pay | Admitting: *Deleted

## 2018-06-18 VITALS — BP 140/62 | HR 43 | Wt 130.0 lb

## 2018-06-18 DIAGNOSIS — R9431 Abnormal electrocardiogram [ECG] [EKG]: Secondary | ICD-10-CM | POA: Diagnosis not present

## 2018-06-18 DIAGNOSIS — I5032 Chronic diastolic (congestive) heart failure: Secondary | ICD-10-CM | POA: Diagnosis not present

## 2018-06-18 DIAGNOSIS — Z951 Presence of aortocoronary bypass graft: Secondary | ICD-10-CM | POA: Insufficient documentation

## 2018-06-18 DIAGNOSIS — E785 Hyperlipidemia, unspecified: Secondary | ICD-10-CM | POA: Insufficient documentation

## 2018-06-18 DIAGNOSIS — I739 Peripheral vascular disease, unspecified: Secondary | ICD-10-CM

## 2018-06-18 DIAGNOSIS — I272 Pulmonary hypertension, unspecified: Secondary | ICD-10-CM | POA: Insufficient documentation

## 2018-06-18 DIAGNOSIS — Z7984 Long term (current) use of oral hypoglycemic drugs: Secondary | ICD-10-CM | POA: Diagnosis not present

## 2018-06-18 DIAGNOSIS — Z87891 Personal history of nicotine dependence: Secondary | ICD-10-CM | POA: Insufficient documentation

## 2018-06-18 DIAGNOSIS — E119 Type 2 diabetes mellitus without complications: Secondary | ICD-10-CM | POA: Diagnosis not present

## 2018-06-18 DIAGNOSIS — I2510 Atherosclerotic heart disease of native coronary artery without angina pectoris: Secondary | ICD-10-CM | POA: Insufficient documentation

## 2018-06-18 DIAGNOSIS — I482 Chronic atrial fibrillation, unspecified: Secondary | ICD-10-CM

## 2018-06-18 DIAGNOSIS — Z952 Presence of prosthetic heart valve: Secondary | ICD-10-CM

## 2018-06-18 DIAGNOSIS — I4510 Unspecified right bundle-branch block: Secondary | ICD-10-CM | POA: Insufficient documentation

## 2018-06-18 DIAGNOSIS — Z79891 Long term (current) use of opiate analgesic: Secondary | ICD-10-CM | POA: Insufficient documentation

## 2018-06-18 DIAGNOSIS — Z7901 Long term (current) use of anticoagulants: Secondary | ICD-10-CM | POA: Diagnosis not present

## 2018-06-18 DIAGNOSIS — I110 Hypertensive heart disease with heart failure: Secondary | ICD-10-CM | POA: Insufficient documentation

## 2018-06-18 DIAGNOSIS — Z79899 Other long term (current) drug therapy: Secondary | ICD-10-CM | POA: Insufficient documentation

## 2018-06-18 MED ORDER — METOPROLOL TARTRATE 25 MG PO TABS: 12.5000 mg | ORAL_TABLET | Freq: Two times a day (BID) | ORAL | 3 refills | Status: DC

## 2018-06-18 NOTE — Telephone Encounter (Signed)
Will forward to [employer] as they manage patient's Coumadin.

## 2018-06-18 NOTE — Patient Instructions (Signed)
DECREASE Metoprolol to 12.5 mg Twice Daily  You've been scheduled for a Right Heart Cath next week, please see attached instruction sheet.   Follow up in 4 weeks.

## 2018-06-18 NOTE — Telephone Encounter (Signed)
Patient is scheduled for a RHC next Thursday 06/27/18.  Dr. Aundra Dubin wants INR between 2.0-2.5.    Will forward to coumadin clinic to manage.

## 2018-06-19 ENCOUNTER — Telehealth: Payer: Self-pay | Admitting: Cardiovascular Disease

## 2018-06-19 DIAGNOSIS — G8929 Other chronic pain: Secondary | ICD-10-CM | POA: Insufficient documentation

## 2018-06-19 DIAGNOSIS — I10 Essential (primary) hypertension: Secondary | ICD-10-CM | POA: Diagnosis not present

## 2018-06-19 DIAGNOSIS — I5032 Chronic diastolic (congestive) heart failure: Secondary | ICD-10-CM | POA: Diagnosis not present

## 2018-06-19 DIAGNOSIS — I480 Paroxysmal atrial fibrillation: Secondary | ICD-10-CM | POA: Diagnosis not present

## 2018-06-19 DIAGNOSIS — Z7901 Long term (current) use of anticoagulants: Secondary | ICD-10-CM | POA: Diagnosis not present

## 2018-06-19 NOTE — Telephone Encounter (Signed)
Appointment re-scheduled for 9-17.

## 2018-06-19 NOTE — Telephone Encounter (Signed)
She should continue with current dose of warfarin and keep her appointment with [place] for next Tuesday 17th.

## 2018-06-19 NOTE — Telephone Encounter (Signed)
Returned call to patient, she states she had INR checked this AM at Dr. Sonda Primes office and it was 2.6.  She is unsure what she needs to take in preparation for her heart cath on 9/19.  Advised would route to pharmD to review.   Patient states Dr. Loren Racer office was to be faxing or calling INR result.

## 2018-06-19 NOTE — Telephone Encounter (Signed)
Follow up:    Patient calling back about some medication before surgery. Please call back.

## 2018-06-19 NOTE — Telephone Encounter (Signed)
[place] is calling to get office notes from [facility] 06/18/2018 faxed over to  [name] office fax [phone]

## 2018-06-19 NOTE — Telephone Encounter (Signed)
[email or web address]

## 2018-06-19 NOTE — Progress Notes (Signed)
PCP: Dr. Osborne Casco Cardiology: Dr. [name] HF Cardiology: Dr. Aundra Dubin  77 yo with history of PAD, CAD s/p CABG, mechanical AVR, and chronic diastolic CHF with prominent RV dysfunction was referred by Dr. [name] for evaluation of pulmonary hypertension and diastolic CHF. Patient had CABG-AVR in 10/05.  She has been in chronic atrial fibrillation, it appears, since at least 4/17.  She has significant PAD followed by Dr. [name].    She was admitted earlier this year in 5/19 with dyspnea and marked volume overloaded. She was started on Lasix and it was titrated up.  Echo showed EF 55-60% with moderate RV dilation, severe RV systolic dysfunction, and PASP 88 mmHg.  Her weight is down considerably since 5/19. She is no longer short of breath walking on flat ground.  She gets fatigued and short of breath with vacuuming.  No dyspnea with ADLs like dressing and showering.  No chest pain.  She does not have claudication and she does not have any ulcers on her feet.  No LH/syncope.  HR is noted to be in the 40s today.   ECG (personally reviewed): atrial fibrillation, incomplete RBBB   Labs (8/19): K 4.2, creatinine 0.8, LDL 56  PMH: 1. Mechanical St Jude aortic valve 10/05: Goal INR 2.5-3.5.  2. CAD: s/p CABG x 2 with AVR in 10/05.  - Cardiolite in 2014 showed no ischemia.  3. HTN 4. Hyperlipidemia 5. Type 2 diabetes 6. PAD: Peripheral arterial dopplers (7/19) with >50% CIA stenosis on right, 70-99% CIA stenosis on the left.  7. H/o CCY 8. Atrial fibrillation: Chronic since at least 4/17. 9. Chronic diastolic CHF with prominent RV dysfunction: Echo (5/19) with EF 56-38%, grade 3 diastolic dysfunction, mechanical aortic valve with mean gradient 10 mmHg, mild MR, moderate RV dilation with moderate-severely decreased RV systolic function, biatrial enlargement, PASP 88 mmHg.  10. Carotid stenosis: 40-59% BICA stenosis on 7/19 carotid dopplers.   Social History   Socioeconomic History  . Marital status: Widowed     Spouse name: Not on file  . Number of children: Not on file  . Years of education: Not on file  . Highest education level: Not on file  Occupational History  . Occupation: Retired  Scientific laboratory technician  . Financial resource strain: Not on file  . Food insecurity:    Worry: Not on file    Inability: Not on file  . Transportation needs:    Medical: Not on file    Non-medical: Not on file  Tobacco Use  . Smoking status: Former Smoker    Packs/day: 1.00    Years: 30.00    Pack years: 30.00    Types: Cigarettes  . Smokeless tobacco: Never Used  Substance and Sexual Activity  . Alcohol use: No    Alcohol/week: 0.0 standard drinks  . Drug use: No  . Sexual activity: Not on file  Lifestyle  . Physical activity:    Days per week: Not on file    Minutes per session: Not on file  . Stress: Not on file  Relationships  . Social connections:    Talks on phone: Not on file    Gets together: Not on file    Attends religious service: Not on file    Active member of club or organization: Not on file    Attends meetings of clubs or organizations: Not on file    Relationship status: Not on file  . Intimate partner violence:    Fear of current or ex partner:  Not on file    Emotionally abused: Not on file    Physically abused: Not on file    Forced sexual activity: Not on file  Other Topics Concern  . Not on file  Social History Narrative  . Not on file   Family History  Problem Relation Age of Onset  . Breast cancer Neg Hx    ROS: All systems reviewed and negative except as per HPI.   Current Outpatient Medications  Medication Sig Dispense Refill  . acetaminophen (TYLENOL) 325 MG tablet Take 2 tablets (650 mg total) by mouth every 6 (six) hours as needed for mild pain, fever or headache (for pain or headaches).    . ALPRAZolam (XANAX) 0.5 MG tablet Take 0.25-0.5 mg by mouth See admin instructions. Take 0.25-0.5 mg two times a day as needed for anxiety and 0.25-0.5 mg at bedtime  scheduled    . atorvastatin (LIPITOR) 80 MG tablet Take 1 tablet (80 mg total) by mouth daily. (Patient taking differently: Take 80 mg by mouth daily with supper. ) 30 tablet 0  . Cholecalciferol (VITAMIN D3) 1000 UNITS CAPS Take 1,000 Units by mouth daily.     [name] COLCRYS 0.6 MG tablet     . ferrous sulfate 325 (65 FE) MG tablet Take 1 tablet (325 mg total) by mouth 2 (two) times daily with a meal. 60 tablet 3  . furosemide (LASIX) 20 MG tablet Take 4 tablets (80 mg total) by mouth daily. 120 tablet 0  . glipiZIDE (GLUCOTROL XL) 2.5 MG 24 hr tablet Take 2.5 mg by mouth daily.     . hydrALAZINE (APRESOLINE) 100 MG tablet Take 100 mg by mouth 2 (two) times daily. Take one-half (50 mg po) BID    . hydrocortisone cream 1 % Apply topically 2 (two) times daily. 30 g 0  . irbesartan (AVAPRO) 300 MG tablet Take 1 tablet (300 mg total) by mouth daily. 30 tablet 0  . magnesium oxide (MAG-OX) 400 (241.3 Mg) MG tablet Take 1 tablet (400 mg total) by mouth daily.    . metFORMIN (GLUCOPHAGE) 500 MG tablet Take 500 mg by mouth 2 (two) times daily with a meal.     . metoprolol tartrate (LOPRESSOR) 25 MG tablet Take 0.5 tablets (12.5 mg total) by mouth 2 (two) times daily. 30 tablet 3  . ondansetron (ZOFRAN) 4 MG tablet     . pantoprazole (PROTONIX) 40 MG tablet Take 40 mg by mouth daily.     Vladimir Faster Glyc-Propyl Glyc PF (SYSTANE PRESERVATIVE FREE) 0.4-0.3 % SOLN Place 1-2 drops into both eyes 3 (three) times daily as needed (for irritation).     . vitamin B-12 (CYANOCOBALAMIN) 100 MCG tablet Take 100 mcg by mouth daily.     [name] warfarin (COUMADIN) 5 MG tablet Take 1.5 to 2 tablets daily as directed by [facility] 150 tablet 0  . potassium chloride SA (K-DUR,KLOR-CON) 20 MEQ tablet Take 1 tablet (20 mEq total) by mouth daily. (Patient not taking: Reported on 06/13/2018) 30 tablet 0  . saccharomyces boulardii (FLORASTOR) 250 MG capsule Take 1 capsule (250 mg total) by mouth 2 (two) times daily. (Patient not  taking: Reported on 06/13/2018) 60 capsule 1  . traMADol (ULTRAM) 50 MG tablet Take 1 tablet (50 mg total) by mouth every 6 (six) hours as needed for moderate pain. (Patient not taking: Reported on 06/13/2018) 15 tablet 0   No current facility-administered medications for this encounter.    BP 140/62   Pulse [name])  43   Wt 59 kg (130 lb)   SpO2 99%   BMI 24.56 kg/m  General: NAD Neck: JVP 8 cm, no thyromegaly or thyroid nodule.  Lungs: Clear to auscultation bilaterally with normal respiratory effort. CV: Nondisplaced PMI.  Heart irregular S1/S2 with mechanical S2, no S3/S4, 2/6 SEM RUSB.  Trace ankle edema.  No carotid bruit.  Normal pedal pulses.  Abdomen: Soft, nontender, no hepatosplenomegaly, no distention.  Skin: Intact without lesions or rashes.  Neurologic: Alert and oriented x 3.  Psych: Normal affect. Extremities: No clubbing or cyanosis.  HEENT: Normal.   Assessment/Plan: 1. Atrial fibrillation: Chronic.  She has been on warfarin with mechanical aortic valve.  She has been in atrial fibrillation as far back as 4/17. Given the chronicity, I think that she is unlikely to successfully hold NSR long-term.   - Would hold off on cardioversion.  - Continue warfarin, goal INR 2.5-3.5. - HR in 40s, I will decrease metoprolol to 12.5 mg bid.  2. Chronic diastolic CHF: With prominent RV dysfunction and severe pulmonary hypertension by echo. It is possible this was group 2 PH (pulmonary venous hypertension) as echo was done when she was markedly volume overloaded.  However, cannot rule out a group 1 PH component. On exam, she is very minimally volume overloaded.   - I recommended a RHC to assess pulmonary hypertension.  If she has significant pulmonary arterial hypertension, would consider selective pulmonary vasodilators.  We discussed risks/benefits and she agrees to procedure. Will aim to keep INR closer to 2 for RHC, can do this without bridging.  - Continue Lasix 80 mg daily for now, may  adjust based on RHC findings.   3. CAD: No chest pain.  Continue atorvastatin and warfarin.  4. PAD: Significant on 7/19 but no claudication, no pedal ulcerations. He follows with Dr. [name].  5. Mechanical aortic valve: Stable on 5/19 echo.  Given co-existing atrial fibrillation, goal INR is 2.5-3.5.  - Continue warfarin.   Loralie Champagne 06/19/2018

## 2018-06-19 NOTE — Telephone Encounter (Signed)
Will Fax Dr. Claris Gladden OV notes from 9/10 to [facility] once completed and signed.

## 2018-06-19 NOTE — H&P (View-Only) (Signed)
PCP: Dr. Osborne Casco Cardiology: Dr. [name] HF Cardiology: Dr. Aundra Dubin  77 yo with history of PAD, CAD s/p CABG, mechanical AVR, and chronic diastolic CHF with prominent RV dysfunction was referred by Dr. [name] for evaluation of pulmonary hypertension and diastolic CHF. Patient had CABG-AVR in 10/05.  She has been in chronic atrial fibrillation, it appears, since at least 4/17.  She has significant PAD followed by Dr. [name].    She was admitted earlier this year in 5/19 with dyspnea and marked volume overloaded. She was started on Lasix and it was titrated up.  Echo showed EF 55-60% with moderate RV dilation, severe RV systolic dysfunction, and PASP 88 mmHg.  Her weight is down considerably since 5/19. She is no longer short of breath walking on flat ground.  She gets fatigued and short of breath with vacuuming.  No dyspnea with ADLs like dressing and showering.  No chest pain.  She does not have claudication and she does not have any ulcers on her feet.  No LH/syncope.  HR is noted to be in the 40s today.   ECG (personally reviewed): atrial fibrillation, incomplete RBBB   Labs (8/19): K 4.2, creatinine 0.8, LDL 56  PMH: 1. Mechanical St Jude aortic valve 10/05: Goal INR 2.5-3.5.  2. CAD: s/p CABG x 2 with AVR in 10/05.  - Cardiolite in 2014 showed no ischemia.  3. HTN 4. Hyperlipidemia 5. Type 2 diabetes 6. PAD: Peripheral arterial dopplers (7/19) with >50% CIA stenosis on right, 70-99% CIA stenosis on the left.  7. H/o CCY 8. Atrial fibrillation: Chronic since at least 4/17. 9. Chronic diastolic CHF with prominent RV dysfunction: Echo (5/19) with EF 62-83%, grade 3 diastolic dysfunction, mechanical aortic valve with mean gradient 10 mmHg, mild MR, moderate RV dilation with moderate-severely decreased RV systolic function, biatrial enlargement, PASP 88 mmHg.  10. Carotid stenosis: 40-59% BICA stenosis on 7/19 carotid dopplers.   Social History   Socioeconomic History  . Marital status: Widowed     Spouse name: Not on file  . Number of children: Not on file  . Years of education: Not on file  . Highest education level: Not on file  Occupational History  . Occupation: Retired  Scientific laboratory technician  . Financial resource strain: Not on file  . Food insecurity:    Worry: Not on file    Inability: Not on file  . Transportation needs:    Medical: Not on file    Non-medical: Not on file  Tobacco Use  . Smoking status: Former Smoker    Packs/day: 1.00    Years: 30.00    Pack years: 30.00    Types: Cigarettes  . Smokeless tobacco: Never Used  Substance and Sexual Activity  . Alcohol use: No    Alcohol/week: 0.0 standard drinks  . Drug use: No  . Sexual activity: Not on file  Lifestyle  . Physical activity:    Days per week: Not on file    Minutes per session: Not on file  . Stress: Not on file  Relationships  . Social connections:    Talks on phone: Not on file    Gets together: Not on file    Attends religious service: Not on file    Active member of club or organization: Not on file    Attends meetings of clubs or organizations: Not on file    Relationship status: Not on file  . Intimate partner violence:    Fear of current or ex partner:  Not on file    Emotionally abused: Not on file    Physically abused: Not on file    Forced sexual activity: Not on file  Other Topics Concern  . Not on file  Social History Narrative  . Not on file   Family History  Problem Relation Age of Onset  . Breast cancer Neg Hx    ROS: All systems reviewed and negative except as per HPI.   Current Outpatient Medications  Medication Sig Dispense Refill  . acetaminophen (TYLENOL) 325 MG tablet Take 2 tablets (650 mg total) by mouth every 6 (six) hours as needed for mild pain, fever or headache (for pain or headaches).    . ALPRAZolam (XANAX) 0.5 MG tablet Take 0.25-0.5 mg by mouth See admin instructions. Take 0.25-0.5 mg two times a day as needed for anxiety and 0.25-0.5 mg at bedtime  scheduled    . atorvastatin (LIPITOR) 80 MG tablet Take 1 tablet (80 mg total) by mouth daily. (Patient taking differently: Take 80 mg by mouth daily with supper. ) 30 tablet 0  . Cholecalciferol (VITAMIN D3) 1000 UNITS CAPS Take 1,000 Units by mouth daily.     [name] COLCRYS 0.6 MG tablet     . ferrous sulfate 325 (65 FE) MG tablet Take 1 tablet (325 mg total) by mouth 2 (two) times daily with a meal. 60 tablet 3  . furosemide (LASIX) 20 MG tablet Take 4 tablets (80 mg total) by mouth daily. 120 tablet 0  . glipiZIDE (GLUCOTROL XL) 2.5 MG 24 hr tablet Take 2.5 mg by mouth daily.     . hydrALAZINE (APRESOLINE) 100 MG tablet Take 100 mg by mouth 2 (two) times daily. Take one-half (50 mg po) BID    . hydrocortisone cream 1 % Apply topically 2 (two) times daily. 30 g 0  . irbesartan (AVAPRO) 300 MG tablet Take 1 tablet (300 mg total) by mouth daily. 30 tablet 0  . magnesium oxide (MAG-OX) 400 (241.3 Mg) MG tablet Take 1 tablet (400 mg total) by mouth daily.    . metFORMIN (GLUCOPHAGE) 500 MG tablet Take 500 mg by mouth 2 (two) times daily with a meal.     . metoprolol tartrate (LOPRESSOR) 25 MG tablet Take 0.5 tablets (12.5 mg total) by mouth 2 (two) times daily. 30 tablet 3  . ondansetron (ZOFRAN) 4 MG tablet     . pantoprazole (PROTONIX) 40 MG tablet Take 40 mg by mouth daily.     Vladimir Faster Glyc-Propyl Glyc PF (SYSTANE PRESERVATIVE FREE) 0.4-0.3 % SOLN Place 1-2 drops into both eyes 3 (three) times daily as needed (for irritation).     . vitamin B-12 (CYANOCOBALAMIN) 100 MCG tablet Take 100 mcg by mouth daily.     [name] warfarin (COUMADIN) 5 MG tablet Take 1.5 to 2 tablets daily as directed by [facility] 150 tablet 0  . potassium chloride SA (K-DUR,KLOR-CON) 20 MEQ tablet Take 1 tablet (20 mEq total) by mouth daily. (Patient not taking: Reported on 06/13/2018) 30 tablet 0  . saccharomyces boulardii (FLORASTOR) 250 MG capsule Take 1 capsule (250 mg total) by mouth 2 (two) times daily. (Patient not  taking: Reported on 06/13/2018) 60 capsule 1  . traMADol (ULTRAM) 50 MG tablet Take 1 tablet (50 mg total) by mouth every 6 (six) hours as needed for moderate pain. (Patient not taking: Reported on 06/13/2018) 15 tablet 0   No current facility-administered medications for this encounter.    BP 140/62   Pulse [name])  43   Wt 59 kg (130 lb)   SpO2 99%   BMI 24.56 kg/m  General: NAD Neck: JVP 8 cm, no thyromegaly or thyroid nodule.  Lungs: Clear to auscultation bilaterally with normal respiratory effort. CV: Nondisplaced PMI.  Heart irregular S1/S2 with mechanical S2, no S3/S4, 2/6 SEM RUSB.  Trace ankle edema.  No carotid bruit.  Normal pedal pulses.  Abdomen: Soft, nontender, no hepatosplenomegaly, no distention.  Skin: Intact without lesions or rashes.  Neurologic: Alert and oriented x 3.  Psych: Normal affect. Extremities: No clubbing or cyanosis.  HEENT: Normal.   Assessment/Plan: 1. Atrial fibrillation: Chronic.  She has been on warfarin with mechanical aortic valve.  She has been in atrial fibrillation as far back as 4/17. Given the chronicity, I think that she is unlikely to successfully hold NSR long-term.   - Would hold off on cardioversion.  - Continue warfarin, goal INR 2.5-3.5. - HR in 40s, I will decrease metoprolol to 12.5 mg bid.  2. Chronic diastolic CHF: With prominent RV dysfunction and severe pulmonary hypertension by echo. It is possible this was group 2 PH (pulmonary venous hypertension) as echo was done when she was markedly volume overloaded.  However, cannot rule out a group 1 PH component. On exam, she is very minimally volume overloaded.   - I recommended a RHC to assess pulmonary hypertension.  If she has significant pulmonary arterial hypertension, would consider selective pulmonary vasodilators.  We discussed risks/benefits and she agrees to procedure. Will aim to keep INR closer to 2 for RHC, can do this without bridging.  - Continue Lasix 80 mg daily for now, may  adjust based on RHC findings.   3. CAD: No chest pain.  Continue atorvastatin and warfarin.  4. PAD: Significant on 7/19 but no claudication, no pedal ulcerations. He follows with Dr. [name].  5. Mechanical aortic valve: Stable on 5/19 echo.  Given co-existing atrial fibrillation, goal INR is 2.5-3.5.  - Continue warfarin.   Loralie Champagne 06/19/2018

## 2018-06-20 ENCOUNTER — Telehealth (HOSPITAL_COMMUNITY): Payer: Self-pay | Admitting: *Deleted

## 2018-06-20 NOTE — Telephone Encounter (Signed)
Follow up  ° ° °Patient is returning your call. °

## 2018-06-20 NOTE — Telephone Encounter (Signed)
Requested office notes faxed to [facility] @ [phone].

## 2018-06-25 ENCOUNTER — Ambulatory Visit (INDEPENDENT_AMBULATORY_CARE_PROVIDER_SITE_OTHER): Payer: Medicare HMO | Admitting: Pharmacist

## 2018-06-25 DIAGNOSIS — Z7901 Long term (current) use of anticoagulants: Secondary | ICD-10-CM

## 2018-06-25 DIAGNOSIS — Z952 Presence of prosthetic heart valve: Secondary | ICD-10-CM

## 2018-06-25 LAB — POCT INR: INR: 2.2 (ref 2.0–3.0)

## 2018-06-27 ENCOUNTER — Other Ambulatory Visit: Payer: Self-pay

## 2018-06-27 ENCOUNTER — Telehealth (HOSPITAL_COMMUNITY): Payer: Self-pay | Admitting: Cardiology

## 2018-06-27 ENCOUNTER — Ambulatory Visit (HOSPITAL_COMMUNITY)
Admission: RE | Admit: 2018-06-27 | Discharge: 2018-06-27 | Disposition: A | Discharge status: Home / Self Care | Payer: Medicare HMO | Source: Ambulatory Visit | Attending: Cardiology | Admitting: Cardiology

## 2018-06-27 ENCOUNTER — Encounter (HOSPITAL_COMMUNITY)
Admission: RE | Disposition: A | Discharge status: Home / Self Care | Payer: Self-pay | Source: Ambulatory Visit | Attending: Cardiology

## 2018-06-27 DIAGNOSIS — I482 Chronic atrial fibrillation: Secondary | ICD-10-CM | POA: Diagnosis not present

## 2018-06-27 DIAGNOSIS — Z7901 Long term (current) use of anticoagulants: Secondary | ICD-10-CM | POA: Diagnosis not present

## 2018-06-27 DIAGNOSIS — Z952 Presence of prosthetic heart valve: Secondary | ICD-10-CM | POA: Diagnosis not present

## 2018-06-27 DIAGNOSIS — E119 Type 2 diabetes mellitus without complications: Secondary | ICD-10-CM | POA: Insufficient documentation

## 2018-06-27 DIAGNOSIS — I5032 Chronic diastolic (congestive) heart failure: Secondary | ICD-10-CM | POA: Insufficient documentation

## 2018-06-27 DIAGNOSIS — Z7984 Long term (current) use of oral hypoglycemic drugs: Secondary | ICD-10-CM | POA: Insufficient documentation

## 2018-06-27 DIAGNOSIS — I739 Peripheral vascular disease, unspecified: Secondary | ICD-10-CM | POA: Insufficient documentation

## 2018-06-27 DIAGNOSIS — Z951 Presence of aortocoronary bypass graft: Secondary | ICD-10-CM | POA: Diagnosis not present

## 2018-06-27 DIAGNOSIS — I6523 Occlusion and stenosis of bilateral carotid arteries: Secondary | ICD-10-CM | POA: Insufficient documentation

## 2018-06-27 DIAGNOSIS — Z79899 Other long term (current) drug therapy: Secondary | ICD-10-CM | POA: Insufficient documentation

## 2018-06-27 DIAGNOSIS — I110 Hypertensive heart disease with heart failure: Secondary | ICD-10-CM | POA: Insufficient documentation

## 2018-06-27 DIAGNOSIS — Z87891 Personal history of nicotine dependence: Secondary | ICD-10-CM | POA: Insufficient documentation

## 2018-06-27 DIAGNOSIS — I2721 Secondary pulmonary arterial hypertension: Secondary | ICD-10-CM | POA: Insufficient documentation

## 2018-06-27 DIAGNOSIS — I272 Pulmonary hypertension, unspecified: Secondary | ICD-10-CM

## 2018-06-27 DIAGNOSIS — E785 Hyperlipidemia, unspecified: Secondary | ICD-10-CM | POA: Insufficient documentation

## 2018-06-27 HISTORY — PX: RIGHT HEART CATH: CATH118263

## 2018-06-27 LAB — BASIC METABOLIC PANEL
Anion gap: 11 (ref 5–15)
BUN: 32 mg/dL — AB (ref 8–23)
CALCIUM: 9.5 mg/dL (ref 8.9–10.3)
CHLORIDE: 96 mmol/L — AB (ref 98–111)
CO2: 22 mmol/L (ref 22–32)
CREATININE: 0.87 mg/dL (ref 0.44–1.00)
GFR calc non Af Amer: >60 mL/min (ref >60)
Glucose, Bld: 107 mg/dL — ABNORMAL HIGH (ref 70–99)
Potassium: 4.3 mmol/L (ref 3.5–5.1)
SODIUM: 129 mmol/L — AB (ref 135–145)

## 2018-06-27 LAB — CBC
HCT: 28.2 % — ABNORMAL LOW (ref 36.0–46.0)
Hemoglobin: 9.8 g/dL — ABNORMAL LOW (ref 12.0–15.0)
MCH: 30.3 pg (ref 26.0–34.0)
MCHC: 34.8 g/dL (ref 30.0–36.0)
MCV: 87.3 fL (ref 78.0–100.0)
PLATELETS: 149 10*3/uL — AB (ref 150–400)
RBC: 3.23 MIL/uL — AB (ref 3.87–5.11)
RDW: 17 % — AB (ref 11.5–15.5)
WBC: 4.2 10*3/uL (ref 4.0–10.5)

## 2018-06-27 LAB — PROTIME-INR
INR: 2.24
PROTHROMBIN TIME: 24.6 s — AB (ref 11.4–15.2)

## 2018-06-27 LAB — POCT I-STAT 3, VENOUS BLOOD GAS (G3P V)
ACID-BASE DEFICIT: 1 mmol/L (ref 0.0–2.0)
Acid-base deficit: 1 mmol/L (ref 0.0–2.0)
BICARBONATE: 22.1 mmol/L (ref 20.0–28.0)
BICARBONATE: 22.3 mmol/L (ref 20.0–28.0)
O2 SAT: 75 %
O2 Saturation: 77 %
PH VEN: 7.452 — AB (ref 7.250–7.430)
PO2 VEN: 38 mmHg (ref 32.0–45.0)
TCO2: 23 mmol/L (ref 22–32)
TCO2: 23 mmol/L (ref 22–32)
pCO2, Ven: 31.4 mmHg — ABNORMAL LOW (ref 44.0–60.0)
pCO2, Ven: 32 mmHg — ABNORMAL LOW (ref 44.0–60.0)
pH, Ven: 7.456 — ABNORMAL HIGH (ref 7.250–7.430)
pO2, Ven: 39 mmHg (ref 32.0–45.0)

## 2018-06-27 LAB — GLUCOSE, CAPILLARY: Glucose-Capillary: 91 mg/dL (ref 70–99)

## 2018-06-27 SURGERY — RIGHT HEART CATH
Anesthesia: LOCAL

## 2018-06-27 MED ORDER — LIDOCAINE HCL (PF) 1 % IJ SOLN
INTRAMUSCULAR | Status: AC
  Filled 2018-06-27: qty 30

## 2018-06-27 MED ORDER — ASPIRIN 81 MG PO CHEW
CHEWABLE_TABLET | ORAL | Status: AC
  Administered 2018-06-27: 81 mg via ORAL
  Filled 2018-06-27: qty 1

## 2018-06-27 MED ORDER — SODIUM CHLORIDE 0.9% FLUSH: 3.0000 mL | Freq: Two times a day (BID) | INTRAVENOUS | Status: DC

## 2018-06-27 MED ORDER — SODIUM CHLORIDE 0.9 % IV SOLN: 250.0000 mL | INTRAVENOUS | Status: DC | PRN

## 2018-06-27 MED ORDER — HEPARIN (PORCINE) IN NACL 1000-0.9 UT/500ML-% IV SOLN
INTRAVENOUS | Status: DC | PRN
  Administered 2018-06-27: 500 mL

## 2018-06-27 MED ORDER — SODIUM CHLORIDE 0.9 % IV SOLN
INTRAVENOUS | Status: DC
  Administered 2018-06-27: 10:00:00 via INTRAVENOUS

## 2018-06-27 MED ORDER — SODIUM CHLORIDE 0.9% FLUSH: 3.0000 mL | INTRAVENOUS | Status: DC | PRN

## 2018-06-27 MED ORDER — LIDOCAINE HCL (PF) 1 % IJ SOLN
INTRAMUSCULAR | Status: DC | PRN
  Administered 2018-06-27: 2 mL

## 2018-06-27 MED ORDER — HEPARIN (PORCINE) IN NACL 1000-0.9 UT/500ML-% IV SOLN
INTRAVENOUS | Status: AC
  Filled 2018-06-27: qty 500

## 2018-06-27 MED ORDER — ASPIRIN 81 MG PO CHEW
81.0000 mg | CHEWABLE_TABLET | ORAL | Status: AC
  Administered 2018-06-27: 81 mg via ORAL

## 2018-06-27 MED ORDER — ACETAMINOPHEN 325 MG PO TABS: 650.0000 mg | ORAL_TABLET | ORAL | Status: DC | PRN

## 2018-06-27 MED ORDER — ONDANSETRON HCL 4 MG/2ML IJ SOLN: 4.0000 mg | Freq: Four times a day (QID) | INTRAMUSCULAR | Status: DC | PRN

## 2018-06-27 SURGICAL SUPPLY — 7 items
CATH BALLN WEDGE 5F 110CM (CATHETERS) ×1 IMPLANT
GUIDEWIRE .025 260CM (WIRE) ×1 IMPLANT
PACK CARDIAC CATHETERIZATION (CUSTOM PROCEDURE TRAY) ×2 IMPLANT
SHEATH GLIDE SLENDER 4/5FR (SHEATH) ×1 IMPLANT
TRANSDUCER W/STOPCOCK (MISCELLANEOUS) ×2 IMPLANT
TUBING ART PRESS 72  MALE/FEM (TUBING) ×1
TUBING ART PRESS 72 MALE/FEM (TUBING) IMPLANT

## 2018-06-27 NOTE — Progress Notes (Signed)
[name] neal cath lab was updated, right AC IV partially in, vein very small but iv positional but got 3 tubes of blood and IV fluid dripping with adjustment, pt does not want another iv at this time. Left AC large vein. Juliette Mangle said the cath lab can try left [facility].

## 2018-06-27 NOTE — Telephone Encounter (Signed)
Pt aware of studies,will arrange two weeks out as pt will need to arrange transportation.

## 2018-06-27 NOTE — Discharge Instructions (Signed)
Venogram, Care After °This sheet gives you information about how to care for yourself after your procedure. Your health care provider may also give you more specific instructions. If you have problems or questions, contact your health care provider. °What can I expect after the procedure? °After the procedure, it is common to have: °· Bruising or mild discomfort in the area where the IV was inserted (insertion site). ° °Follow these instructions at home: °Eating and drinking °· Follow instructions from your health care provider about eating or drinking restrictions. °· Drink a lot of fluids for the first several days after the procedure, as directed by your health care provider. This helps to wash (flush) the contrast out of your body. Examples of healthy fluids include water or low-calorie drinks. °General instructions °· Check your IV insertion area every day for signs of infection. Check for: °? Redness, swelling, or pain. °? Fluid or blood. °? Warmth. °? Pus or a bad smell. °· Take over-the-counter and prescription medicines only as told by your health care provider. °· Rest and return to your normal activities as told by your health care provider. Ask your health care provider what activities are safe for you. °· Do not drive for 24 hours if you were given a medicine to help you relax (sedative), or until your health care provider approves. °· Keep all follow-up visits as told by your health care provider. This is important. °Contact a health care provider if: °· Your skin becomes itchy or you develop a rash or hives. °· You have a fever that does not get better with medicine. °· You feel nauseous. °· You vomit. °· You have redness, swelling, or pain around the insertion site. °· You have fluid or blood coming from the insertion site. °· Your insertion area feels warm to the touch. °· You have pus or a bad smell coming from the insertion site. °Get help right away if: °· You have difficulty breathing or  shortness of breath. °· You develop chest pain. °· You faint. °· You feel very dizzy. °These symptoms may represent a serious problem that is an emergency. Do not wait to see if the symptoms will go away. Get medical help right away. Call your local emergency services (911 in the [place] Do not drive yourself to the hospital. °Summary °· After your procedure, it is common to have bruising or mild discomfort in the area where the IV was inserted. °· You should check your IV insertion area every day for signs of infection. °· Take over-the-counter and prescription medicines only as told by your health care provider. °· You should drink a lot of fluids for the first several days after the procedure to help flush the contrast from your body. °This information is not intended to replace advice given to you by your health care provider. Make sure you discuss any questions you have with your health care provider. °Document Released: 07/16/2013 Document Revised: 08/19/2016 Document Reviewed: 08/19/2016 °Elsevier Interactive Patient Education © 2017 [employer] ° °

## 2018-06-27 NOTE — Telephone Encounter (Signed)
-----   Message from [name], MD sent at 06/27/2018  2:23 PM EDT ----- Please arrange for this patient to have V/Q scan to rule out chronic PE and PFTs to assess lung function as part of pulmonary hypertension workup.

## 2018-06-27 NOTE — Interval H&P Note (Signed)
History and Physical Interval Note:  06/27/2018 1:49 PM  [name]  has presented today for surgery, with the diagnosis of hf  The various methods of treatment have been discussed with the patient and family. After consideration of risks, benefits and other options for treatment, the patient has consented to  Procedure(s): RIGHT HEART CATH (N/A) as a surgical intervention .  [name] history has been reviewed, patient examined, no change in status, stable for surgery.  I have reviewed the patient's chart and labs.  Questions were answered to the patient's satisfaction.     [name] [employer]

## 2018-06-28 ENCOUNTER — Encounter (HOSPITAL_COMMUNITY): Payer: Self-pay | Admitting: Cardiology

## 2018-07-09 ENCOUNTER — Other Ambulatory Visit: Payer: Self-pay

## 2018-07-09 NOTE — Patient Outreach (Signed)
[facility]) Care Management  07/09/2018   [name] [birth year] [id number]  Outreach attempt # 1 to the patient for initial assessment. HIPAA verified with patient.  Discussed and offered [facility] care management services with patient. Patient verbally agreed to services.    Social: The patient lives alone in the home.  She has a son that comes to visit her and he is very supportive,  She states that she can perform her ADLS/IADLS independently.  She is able to drive herself to her appointments.  The durable medical equipment in the home consist of walker, cane, CBG meter, blood pressure cuff, scales, eyeglasses,dentures and shower chair.  Conditions: Per chart review and conversation with the patient her conditions include HTN, CAD, A Fib,CHF, hypercholesteremia and GERD.  She states that she is doing fair.  The patient denies any pain or falls.  She states that she weighed today and her weight was 129 lbs her blood pressure was 135/54.  She states that she checks her weight and blood pressure daily.  The patient states that she tries to follow a low salt heart healthy diet.  The patient reports that she has lost her hearing in her right ear. She is able to hear in the left. She is planning on scheduling an appointment to be tested. The patient also states that she had a heart cath performed on 9/19  Medications:The patient is on sixteen medications.  She is able to manage her medications.  She takes her medications as prescribed.  She does not express any concern with paying for her medications.  Appointments: Patient has an appointment with Dr. Simonne Come at [facility] this month.  Advanced Directives: The patient was offered information on advanced directive and declined but she state that she would like information sent to her on a mental health care advanced directive  Current Medications:  Current Outpatient Medications  Medication Sig Dispense Refill   . acetaminophen (TYLENOL) 325 MG tablet Take 2 tablets (650 mg total) by mouth every 6 (six) hours as needed for mild pain, fever or headache (for pain or headaches).    . ALPRAZolam (XANAX) 0.5 MG tablet Take 0.25 mg by mouth daily.     [name] atorvastatin (LIPITOR) 80 MG tablet Take 1 tablet (80 mg total) by mouth daily. (Patient taking differently: Take 80 mg by mouth daily with supper. ) 30 tablet 0  . Cholecalciferol (VITAMIN D3) 1000 UNITS CAPS Take 1,000 Units by mouth daily.     [name] COLCRYS 0.6 MG tablet Take 0.6 mg by mouth daily as needed (gout flare).     . ferrous sulfate 325 (65 FE) MG tablet Take 1 tablet (325 mg total) by mouth 2 (two) times daily with a meal. 60 tablet 3  . glipiZIDE (GLUCOTROL XL) 2.5 MG 24 hr tablet Take 2.5 mg by mouth daily.     . hydrALAZINE (APRESOLINE) 50 MG tablet Take 50 mg by mouth 3 (three) times daily.     . irbesartan (AVAPRO) 300 MG tablet Take 1 tablet (300 mg total) by mouth daily. 30 tablet 0  . magnesium oxide (MAG-OX) 400 (241.3 Mg) MG tablet Take 1 tablet (400 mg total) by mouth daily. (Patient taking differently: Take 800 mg by mouth daily. )    . metFORMIN (GLUCOPHAGE) 500 MG tablet Take 500 mg by mouth 2 (two) times daily with a meal.     . metoprolol tartrate (LOPRESSOR) 25  MG tablet Take 0.5 tablets (12.5 mg total) by mouth 2 (two) times daily. 30 tablet 3  . ondansetron (ZOFRAN) 4 MG tablet Take 4 mg by mouth every 8 (eight) hours as needed for nausea or vomiting.     . pantoprazole (PROTONIX) 40 MG tablet Take 40 mg by mouth daily.     . vitamin B-12 (CYANOCOBALAMIN) 100 MCG tablet Take 100 mcg by mouth daily.     [name] warfarin (COUMADIN) 5 MG tablet Take 1.5 to 2 tablets daily as directed by Coumadin Clinic (Patient taking differently: Take 7.5mg  by mouth daily except Tuesday and Friday take 10mg ) 150 tablet 0  . furosemide (LASIX) 20 MG tablet Take 4 tablets (80 mg total) by mouth daily. 120 tablet 0  . Polyethyl Glyc-Propyl Glyc PF (SYSTANE  PRESERVATIVE FREE) 0.4-0.3 % SOLN Place 1-2 drops into both eyes 3 (three) times daily as needed (for irritation).      No current facility-administered medications for this visit.     Functional Status:  In your present state of health, do you have any difficulty performing the following activities: 07/09/2018 04/04/2018  Hearing? - Y  Comment - see ROS-- reports decreased hearing in (R) ear due to hx "inner ear in 2013"  Vision? - N  Difficulty concentrating or making decisions? - N  Walking or climbing stairs? - -  Dressing or bathing? N -  Doing errands, shopping? - Y  Comment - Family assists with transportation to provider appointments/ errands  Preparing Food and eating ? - -  Using the Toilet? - -  In the past six months, have you accidently leaked urine? - -  Do you have problems with loss of bowel control? - -  Managing your Medications? - -  Managing your Finances? - -  Comment - -  Housekeeping or managing your Housekeeping? - -  Comment - -  Some recent data might be hidden    Fall/Depression Screening: Fall Risk  07/09/2018 06/13/2018 05/24/2018  Falls in the past year? No (No Data) (No Data)  Comment - No new or recent falls per patient report today Denies new/ recent falls  Risk for fall due to : - Medication side effect -  Risk for fall due to: Comment - - -   PHQ 2/9 Scores 07/09/2018 06/13/2018 04/04/2018  PHQ - 2 Score 0 0 0    Assessment: Patient will benefit from health coach outreach for disease management and support.   [facility] CM Care Plan Problem One     Most Recent Value  Care Plan Problem One  Knowledge deficit related to diease management  Care Plan for Problem One  Active  [facility] Long Term Goal   Over the next 45 days, patient will review previously provided education around low salt diet, and will discuss her dietary practices with [place], as evidenced by successful patient engagement with [place] Term Goal Start Date   07/09/18  Interventions for Problem One Long Term Goal  Sending the patient educational information book living with Heart Failure and EMMI about salt.      Plan: RN Health Coach will provide ongoing education for patient on heart failure through phone calls and sending printed information to patient for further discussion.  RN Health Coach will send welcome packet with consent to patient as well as printed information on heart failure.  RN Health Coach will send initial barriers letter, assessment, and care plan to primary care  physician. RN Health Coach sent referral to social work for information on mental health care power of attorney. RN Health Coach will contact patient in the month of November and patient agrees to next outreach.  Lazaro Arms RN, BSN, [place] Direct Dial:  [phone]  Fax: [phone]

## 2018-07-09 NOTE — Progress Notes (Signed)
Scheduled for 10/3

## 2018-07-10 ENCOUNTER — Other Ambulatory Visit: Payer: Self-pay | Admitting: Licensed Clinical Social Worker

## 2018-07-10 NOTE — Patient Outreach (Signed)
[facility]) Care Management  07/10/2018  CORISSA OGUINN 03-May-1941 [id number]    Request received from [place], [name], to mail [place] packets to patient.  Mailed today.  Ronn Melena, BSW Social Worker [phone]

## 2018-07-10 NOTE — Patient Outreach (Signed)
[facility]) Care Management  07/10/2018  [name] 11-06-1940 [id number]  [facility] CSW received new referral on 07/10/18 with the request to contact patient as she is interested in learning more about mental health power of attorney. [facility] CSW completed initial call to patient and was able to reach her successfully. HIPPA verifications provided. Patient reports that she would like to gain information on mental health care advanced directive. She reports that this will be for a friend of hers and not for herself. Patient shares that she would also like for advance directives to be sent to her for herself to review. [facility] CSW spent time eduating patient on the difference between these documents as well as the completion process of these. Patient reports having a friend that is a Patent examiner. Patient denies any further social work needs at this time. [facility] CSW will mail requested information to patient and will follow up within two weeks to make sure that resources were received and all questions have been answered. [facility] CSW will not open social work program at this time as patient is only requesting information to be mailed to her at this time.  [name], BSW, MSW, [email or web address] Phone: [phone] Fax: [phone]

## 2018-07-11 ENCOUNTER — Ambulatory Visit (HOSPITAL_COMMUNITY)
Admission: RE | Admit: 2018-07-11 | Discharge: 2018-07-11 | Disposition: A | Discharge status: Home / Self Care | Payer: Medicare HMO | Source: Ambulatory Visit | Attending: Cardiology | Admitting: Cardiology

## 2018-07-11 DIAGNOSIS — I272 Pulmonary hypertension, unspecified: Secondary | ICD-10-CM

## 2018-07-11 DIAGNOSIS — Z87891 Personal history of nicotine dependence: Secondary | ICD-10-CM | POA: Insufficient documentation

## 2018-07-11 DIAGNOSIS — I270 Primary pulmonary hypertension: Secondary | ICD-10-CM | POA: Diagnosis not present

## 2018-07-11 DIAGNOSIS — Z951 Presence of aortocoronary bypass graft: Secondary | ICD-10-CM | POA: Insufficient documentation

## 2018-07-11 DIAGNOSIS — I517 Cardiomegaly: Secondary | ICD-10-CM | POA: Insufficient documentation

## 2018-07-11 DIAGNOSIS — J449 Chronic obstructive pulmonary disease, unspecified: Secondary | ICD-10-CM | POA: Diagnosis not present

## 2018-07-11 DIAGNOSIS — J90 Pleural effusion, not elsewhere classified: Secondary | ICD-10-CM

## 2018-07-11 LAB — PULMONARY FUNCTION TEST
DL/VA % pred: 83 %
DL/VA: 3.66 ml/min/mmHg/L
DLCO COR % PRED: 71 %
DLCO UNC: 12.48 ml/min/mmHg
DLCO cor: 14.37 ml/min/mmHg
DLCO unc % pred: 61 %
FEF 25-75 Post: 1.17 L/sec
FEF 25-75 Pre: 0.9 L/sec
FEF2575-%Change-Post: 30 %
FEF2575-%Pred-Post: 93 %
FEF2575-%Pred-Pre: 71 %
FEV1-%CHANGE-POST: 8 %
FEV1-%Pred-Post: 86 %
FEV1-%Pred-Pre: 79 %
FEV1-POST: 1.46 L
FEV1-PRE: 1.34 L
FEV1FVC-%Change-Post: 0 %
FEV1FVC-%Pred-Pre: 96 %
FEV6-%Change-Post: 8 %
FEV6-%PRED-POST: 93 %
FEV6-%PRED-PRE: 85 %
FEV6-POST: 1.99 L
FEV6-Pre: 1.84 L
FEV6FVC-%CHANGE-POST: -2 %
FEV6FVC-%PRED-POST: 104 %
FEV6FVC-%PRED-PRE: 106 %
FVC-%CHANGE-POST: 8 %
FVC-%PRED-PRE: 82 %
FVC-%Pred-Post: 89 %
FVC-POST: 2.04 L
FVC-PRE: 1.87 L
POST FEV6/FVC RATIO: 98 %
PRE FEV6/FVC RATIO: 100 %
Post FEV1/FVC ratio: 71 %
Pre FEV1/FVC ratio: 71 %
RV % PRED: 169 %
RV: 3.78 L
TLC % PRED: 125 %
TLC: 5.77 L

## 2018-07-11 MED ORDER — ALBUTEROL SULFATE (2.5 MG/3ML) 0.083% IN NEBU
2.5000 mg | INHALATION_SOLUTION | Freq: Once | RESPIRATORY_TRACT | Status: AC
  Administered 2018-07-11: 2.5 mg via RESPIRATORY_TRACT

## 2018-07-11 MED ORDER — TECHNETIUM TO 99M ALBUMIN AGGREGATED
4.2000 | Freq: Once | INTRAVENOUS | Status: AC | PRN
  Administered 2018-07-11: 4.2 via INTRAVENOUS

## 2018-07-11 MED ORDER — TECHNETIUM TC 99M DIETHYLENETRIAME-PENTAACETIC ACID
31.5000 | Freq: Once | INTRAVENOUS | Status: AC | PRN
  Administered 2018-07-11: 31.5 via RESPIRATORY_TRACT

## 2018-07-11 NOTE — Discharge Instructions (Signed)
Pulmonary Hypertension Pulmonary hypertension is high blood pressure within the arteries in your lungs (pulmonary arteries). It is different than having high blood pressure elsewhere in your body, such as blood pressure that is measured with a blood pressure cuff. Pulmonary hypertension makes it harder for blood to flow through the lungs. As a result, the heart must work harder to pump blood through the lungs, and it may be harder for you to breathe. Over time, this can weaken the heart muscle. Pulmonary hypertension is a serious condition and it can be fatal. What are the causes? Many different medical conditions can cause pulmonary hypertension. Pulmonary hypertension can be categorized by cause into five groups: Group 1 Pulmonary hypertension that is caused by abnormal growth of small blood vessels in the lungs (pulmonary arterial hypertension). The abnormal blood vessel growth may have no known cause, or it may be:  Passed along from a parent (hereditary).  Caused by another disease, such as a connective tissue disease (including lupus or scleroderma) or HIV.  Caused by certain drugs or toxins.  Group 2 Pulmonary hypertension that is caused by weakness of the main chamber of the heart (left ventricle) or heart valve disease. Group 3 Pulmonary hypertension that is caused by lung disease or low oxygen levels. Causes in this group include:  Emphysema or chronic obstructive pulmonary disease (COPD).  Untreated sleep apnea.  Pulmonary fibrosis.  Group 4 Pulmonary hypertension that is caused by blood clots in the lungs (pulmonary emboli). Group 5 Other causes of pulmonary hypertension, such as sickle cell anemia, or a mix of multiple causes. What are the signs or symptoms? Symptoms of this condition include:  Shortness of breath. You may notice shortness of breath with: ? Activity, such as walking. ? No activity.  Tiredness and fatigue.  Dizziness or fainting.  Rapid heartbeat or  feeling your heart flutter or skip a beat (palpitations).  Neck vein enlargement.  Bluish color to your lips and fingertips.  How is this diagnosed? This condition may be diagnosed by:  Chest X-ray.  Arterial blood gases. This test checks the acidity of your blood as well as your blood oxygen and carbon dioxide levels.  CT scan. This test can provide detailed images of your lungs.  Pulmonary function test. This test measures how much air your lungs can hold. It also tests how well air moves in and out of your lungs.  Electrocardiogram (ECG). This test traces the electrical activity of your heart.  Echocardiogram. This test is used to look at your heart in motion and check how it is functioning.  Heart catheterization. This test can measure the pressure in your pulmonary artery and the right side of your heart.  Lung biopsy. This procedure involves checking a sample of lung tissue to find underlying causes.  How is this treated? There is no cure for pulmonary hypertension, but treatment can help to relieve symptoms and slow the progress of the condition. Treatment can involve:  Medicines, such as: ? Blood pressure medicines. ? Medicines to relax (dilate) the pulmonary blood vessels. ? Water pills to get rid of extra fluid (diuretic medicines). ? Blood-thinning medicines.  Surgery. For severe pulmonary hypertension that does not respond to medical treatment, heart-lung or lung transplant may be needed.  Follow these instructions at home:  Take medicines only as directed by your health care provider. These include over-the-counter medicines and prescription medicines. Take all medicines exactly as instructed. Do not change or stop medicines without first checking with your health   care provider.  Do not smoke. If you need help quitting, ask your health care provider.  Eat a healthy diet.  Limit your salt (sodium) intake to less than 2,300 mg per day.  Stay as active as  possible. Exercise as directed by your health care provider. Talk with your health care provider about what type of exercise is safe for you.  Avoid high altitudes.  Avoid hot tubs and saunas.  Avoid becoming pregnant, if this applies. Talk with your health care provider about safe methods of birth control.  Keep all follow-up visits as directed by your health care provider. This is important. Get help right away if:  You have severe shortness of breath.  You develop chest pain or pressure in your chest.  You cough up blood.  You develop swelling of your feet or legs.  You have a significant increase in weight within 1-2 days. This information is not intended to replace advice given to you by your health care provider. Make sure you discuss any questions you have with your health care provider. Document Released: 07/23/2007 Document Revised: 04/14/2016 Document Reviewed: 03/17/2013 Elsevier Interactive Patient Education  2018 Elsevier Inc.  

## 2018-07-19 ENCOUNTER — Ambulatory Visit (INDEPENDENT_AMBULATORY_CARE_PROVIDER_SITE_OTHER): Payer: Medicare HMO | Admitting: Pharmacist Clinician (PhC)/ Clinical Pharmacy Specialist

## 2018-07-19 DIAGNOSIS — Z7901 Long term (current) use of anticoagulants: Secondary | ICD-10-CM

## 2018-07-19 DIAGNOSIS — I4891 Unspecified atrial fibrillation: Secondary | ICD-10-CM

## 2018-07-19 DIAGNOSIS — Z952 Presence of prosthetic heart valve: Secondary | ICD-10-CM

## 2018-07-19 LAB — POCT INR: INR: 3.5 — AB (ref 2.0–3.0)

## 2018-07-19 NOTE — Patient Instructions (Signed)
Description   Only take 1 tablet tomorrow (October 12), then decrease dose to 7.5mg  (1.5 tablets) daily, except 10 mg each Tuesday.  Repeat INR in 3 weeks

## 2018-07-22 ENCOUNTER — Other Ambulatory Visit: Payer: Self-pay | Admitting: Licensed Clinical Social Worker

## 2018-07-22 NOTE — Patient Outreach (Signed)
[facility]) Care Management  07/22/2018  SAMREEN SELTZER [birth year] [id number]  [facility] CSW received return call from patient on 07/22/18. HIPPA verifications received successfully. Patient reports that she successfully received requested information in the mail that [facility] CSW sent to her. Patient appreciative of information and education provided. Patient denies any further social work needs at this time. [facility] CSW will sign off at this time.   [name], BSW, MSW, [email or web address] Phone: [phone] Fax: [phone]

## 2018-07-22 NOTE — Patient Outreach (Signed)
[facility]) Care Management  07/22/2018  [name] 05-Jul-1941 [id number]  Assessment-CSW completed outreach attempt today to ensure that requested mailed resources were successfully received by patient. CSW unable to reach patient successfully. CSW left a HIPPA compliant voice message encouraging patient to return call once available.  Plan-CSW will await return call or complete an additional outreach if needed.  [name], BSW, MSW, [name] Phone: [phone] Fax: [phone]

## 2018-07-23 ENCOUNTER — Other Ambulatory Visit: Payer: Self-pay

## 2018-07-23 ENCOUNTER — Ambulatory Visit (HOSPITAL_COMMUNITY)
Admission: RE | Admit: 2018-07-23 | Discharge: 2018-07-23 | Disposition: A | Discharge status: Home / Self Care | Payer: Medicare HMO | Source: Ambulatory Visit | Attending: Cardiology | Admitting: Cardiology

## 2018-07-23 ENCOUNTER — Encounter (HOSPITAL_COMMUNITY): Payer: Self-pay | Admitting: Cardiology

## 2018-07-23 VITALS — BP 140/56 | HR 58 | Wt 131.4 lb

## 2018-07-23 DIAGNOSIS — Z952 Presence of prosthetic heart valve: Secondary | ICD-10-CM | POA: Diagnosis not present

## 2018-07-23 DIAGNOSIS — E785 Hyperlipidemia, unspecified: Secondary | ICD-10-CM | POA: Diagnosis not present

## 2018-07-23 DIAGNOSIS — Z951 Presence of aortocoronary bypass graft: Secondary | ICD-10-CM | POA: Diagnosis not present

## 2018-07-23 DIAGNOSIS — E119 Type 2 diabetes mellitus without complications: Secondary | ICD-10-CM | POA: Diagnosis not present

## 2018-07-23 DIAGNOSIS — I5032 Chronic diastolic (congestive) heart failure: Secondary | ICD-10-CM | POA: Diagnosis not present

## 2018-07-23 DIAGNOSIS — Z87891 Personal history of nicotine dependence: Secondary | ICD-10-CM | POA: Insufficient documentation

## 2018-07-23 DIAGNOSIS — I482 Chronic atrial fibrillation, unspecified: Secondary | ICD-10-CM | POA: Insufficient documentation

## 2018-07-23 DIAGNOSIS — I6523 Occlusion and stenosis of bilateral carotid arteries: Secondary | ICD-10-CM | POA: Diagnosis not present

## 2018-07-23 DIAGNOSIS — I4891 Unspecified atrial fibrillation: Secondary | ICD-10-CM | POA: Diagnosis not present

## 2018-07-23 DIAGNOSIS — Z79899 Other long term (current) drug therapy: Secondary | ICD-10-CM | POA: Diagnosis not present

## 2018-07-23 DIAGNOSIS — I739 Peripheral vascular disease, unspecified: Secondary | ICD-10-CM | POA: Insufficient documentation

## 2018-07-23 DIAGNOSIS — Z7984 Long term (current) use of oral hypoglycemic drugs: Secondary | ICD-10-CM | POA: Diagnosis not present

## 2018-07-23 DIAGNOSIS — Z7901 Long term (current) use of anticoagulants: Secondary | ICD-10-CM | POA: Diagnosis not present

## 2018-07-23 DIAGNOSIS — I272 Pulmonary hypertension, unspecified: Secondary | ICD-10-CM | POA: Diagnosis not present

## 2018-07-23 DIAGNOSIS — I110 Hypertensive heart disease with heart failure: Secondary | ICD-10-CM | POA: Diagnosis not present

## 2018-07-23 DIAGNOSIS — I2510 Atherosclerotic heart disease of native coronary artery without angina pectoris: Secondary | ICD-10-CM | POA: Diagnosis not present

## 2018-07-23 LAB — CBC
HCT: 28.2 % — ABNORMAL LOW (ref 36.0–46.0)
HEMOGLOBIN: 9.3 g/dL — AB (ref 12.0–15.0)
MCH: 30.1 pg (ref 26.0–34.0)
MCHC: 33 g/dL (ref 30.0–36.0)
MCV: 91.3 fL (ref 80.0–100.0)
NRBC: 0 % (ref 0.0–0.2)
Platelets: 211 10*3/uL (ref 150–400)
RBC: 3.09 MIL/uL — AB (ref 3.87–5.11)
RDW: 15.4 % (ref 11.5–15.5)
WBC: 5.1 10*3/uL (ref 4.0–10.5)

## 2018-07-23 LAB — BASIC METABOLIC PANEL
ANION GAP: 10 (ref 5–15)
BUN: 31 mg/dL — ABNORMAL HIGH (ref 8–23)
CO2: 23 mmol/L (ref 22–32)
Calcium: 9.9 mg/dL (ref 8.9–10.3)
Chloride: 97 mmol/L — ABNORMAL LOW (ref 98–111)
Creatinine, Ser: 0.93 mg/dL (ref 0.44–1.00)
GFR calc non Af Amer: 57 mL/min — ABNORMAL LOW (ref >60)
Glucose, Bld: 102 mg/dL — ABNORMAL HIGH (ref 70–99)
POTASSIUM: 4.5 mmol/L (ref 3.5–5.1)
SODIUM: 130 mmol/L — AB (ref 135–145)

## 2018-07-23 LAB — BRAIN NATRIURETIC PEPTIDE: B NATRIURETIC PEPTIDE 5: 220.2 pg/mL — AB (ref 0.0–100.0)

## 2018-07-23 NOTE — Progress Notes (Signed)
PCP: Dr. Osborne Casco Cardiology: Dr. [name] HF Cardiology: Dr. Aundra Dubin  77 yo with history of PAD, CAD s/p CABG, mechanical AVR, and chronic diastolic CHF with prominent RV dysfunction was referred by Dr. [name] for evaluation of pulmonary hypertension and diastolic CHF. Patient had CABG-AVR in 10/05.  She has been in chronic atrial fibrillation, it appears, since at least 4/17.  She has significant PAD followed by Dr. [name].    She was admitted in 5/19 with dyspnea and marked volume overloaded. She was started on Lasix and it was titrated up.  Echo showed EF 55-60% with moderate RV dilation, severe RV systolic dysfunction, and PASP 88 mmHg.  [place] in 9/19 showed moderate pulmonary hypertension with near normal filling pressures. PVR was not particularly high at 3.1 WU primarily due to elevated cardiac output. V/Q scan did not show chronic PEs and PFTs were unremarkable.   She returns for followup of pulmonary hypertension and diastolic CHF.  She is doing well overall, weight is stable.  She is not short of breath walking on flat ground if she takes her time.  Dyspnea with stairs, inclines.  No orthopnea/PND.  No lightheadedness.  She has arthritis pain in her hands.    ECG (personally reviewed): atrial fibrillation, incomplete RBBB   Labs (8/19): K 4.2, creatinine 0.8, LDL 56 Labs (9/19): K 4.3, Na 129, creatinine 0.87, hgb 9.8  6 min walk (10/19): 189 m  PMH: 1. Mechanical St Jude aortic valve 10/05: Goal INR 2.5-3.5.  2. CAD: s/p CABG x 2 with AVR in 10/05.  - Cardiolite in 2014 showed no ischemia.  3. HTN 4. Hyperlipidemia 5. Type 2 diabetes 6. PAD: Peripheral arterial dopplers (7/19) with >50% CIA stenosis on right, 70-99% CIA stenosis on the left.  7. H/o CCY 8. Atrial fibrillation: Chronic since at least 4/17. 9. Chronic diastolic CHF with prominent RV dysfunction: Echo (5/19) with EF 16-10%, grade 3 diastolic dysfunction, mechanical aortic valve with mean gradient 10 mmHg, mild MR, moderate  RV dilation with moderate-severely decreased RV systolic function, biatrial enlargement, PASP 88 mmHg.  10. Carotid stenosis: 40-59% BICA stenosis on 7/19 carotid dopplers.  11. Pulmonary hypertension: RHC (9/19) with mean RA 5, PA 63/17 mean 36, mean PCWP 16, CI 4.16, PVR 3.1 WU.  - V/Q scan (10/19): no evidence for chronic PE.  - PFTs (10/19): minimal obstruction.   Social History   Socioeconomic History  . Marital status: Widowed    Spouse name: Not on file  . Number of children: Not on file  . Years of education: Not on file  . Highest education level: Not on file  Occupational History  . Occupation: Retired  Scientific laboratory technician  . Financial resource strain: Not on file  . Food insecurity:    Worry: Not on file    Inability: Not on file  . Transportation needs:    Medical: Not on file    Non-medical: Not on file  Tobacco Use  . Smoking status: Former Smoker    Packs/day: 1.00    Years: 30.00    Pack years: 30.00    Types: Cigarettes  . Smokeless tobacco: Never Used  Substance and Sexual Activity  . Alcohol use: No    Alcohol/week: 0.0 standard drinks  . Drug use: No  . Sexual activity: Not on file  Lifestyle  . Physical activity:    Days per week: Not on file    Minutes per session: Not on file  . Stress: Not on file  Relationships  .  Social connections:    Talks on phone: Not on file    Gets together: Not on file    Attends religious service: Not on file    Active member of club or organization: Not on file    Attends meetings of clubs or organizations: Not on file    Relationship status: Not on file  . Intimate partner violence:    Fear of current or ex partner: Not on file    Emotionally abused: Not on file    Physically abused: Not on file    Forced sexual activity: Not on file  Other Topics Concern  . Not on file  Social History Narrative  . Not on file   Family History  Problem Relation Age of Onset  . Breast cancer Neg Hx    ROS: All systems reviewed  and negative except as per HPI.   Current Outpatient Medications  Medication Sig Dispense Refill  . acetaminophen (TYLENOL) 325 MG tablet Take 2 tablets (650 mg total) by mouth every 6 (six) hours as needed for mild pain, fever or headache (for pain or headaches).    . ALPRAZolam (XANAX) 0.5 MG tablet Take 0.25 mg by mouth daily.     [name] atorvastatin (LIPITOR) 80 MG tablet Take 1 tablet (80 mg total) by mouth daily. 30 tablet 0  . Cholecalciferol (VITAMIN D3) 1000 UNITS CAPS Take 1,000 Units by mouth daily.     [name] COLCRYS 0.6 MG tablet Take 0.6 mg by mouth daily as needed (gout flare).     . ferrous sulfate 325 (65 FE) MG tablet Take 1 tablet (325 mg total) by mouth 2 (two) times daily with a meal. 60 tablet 3  . furosemide (LASIX) 40 MG tablet Take 80 mg by mouth daily.    [name] glipiZIDE (GLUCOTROL XL) 2.5 MG 24 hr tablet Take 2.5 mg by mouth daily.     . hydrALAZINE (APRESOLINE) 50 MG tablet Take 50 mg by mouth 3 (three) times daily.     . irbesartan (AVAPRO) 300 MG tablet Take 1 tablet (300 mg total) by mouth daily. 30 tablet 0  . magnesium oxide (MAG-OX) 400 (241.3 Mg) MG tablet Take 1 tablet (400 mg total) by mouth daily.    . metFORMIN (GLUCOPHAGE) 500 MG tablet Take 500 mg by mouth 2 (two) times daily with a meal.     . metoprolol tartrate (LOPRESSOR) 25 MG tablet Take 0.5 tablets (12.5 mg total) by mouth 2 (two) times daily. 30 tablet 3  . ondansetron (ZOFRAN) 4 MG tablet Take 4 mg by mouth every 8 (eight) hours as needed for nausea or vomiting.     . pantoprazole (PROTONIX) 40 MG tablet Take 40 mg by mouth daily.     Vladimir Faster Glyc-Propyl Glyc PF (SYSTANE PRESERVATIVE FREE) 0.4-0.3 % SOLN Place 1-2 drops into both eyes 3 (three) times daily as needed (for irritation).     . vitamin B-12 (CYANOCOBALAMIN) 100 MCG tablet Take 100 mcg by mouth daily.     [name] warfarin (COUMADIN) 5 MG tablet Take 1.5 to 2 tablets daily as directed by [facility]mad[facility] Clinic (Patient taking differently: Take 7.5mg  by mouth  daily except Tuesday and Friday take 10mg ) 150 tablet 0   No current facility-administered medications for this encounter.    BP (!) 140/56   Pulse (!) 58   Wt 59.6 kg (131 lb 6 oz)   SpO2 100%   BMI 24.82 kg/m  General: NAD Neck: No JVD, no thyromegaly or thyroid  nodule.  Lungs: Clear to auscultation bilaterally with normal respiratory effort. CV: Nondisplaced PMI.  Heart regular S1/S2 mechanical S2, no S3/S4, 2/6 SEM RUSB.  No peripheral edema.  No carotid bruit.  Normal pedal pulses.  Abdomen: Soft, nontender, no hepatosplenomegaly, no distention.  Skin: Intact without lesions or rashes.  Neurologic: Alert and oriented x 3.  Psych: Normal affect. Extremities: No clubbing or cyanosis.  HEENT: Normal.   Assessment/Plan: 1. Atrial fibrillation: Chronic.  She has been on warfarin with mechanical aortic valve.  She has been in atrial fibrillation as far back as 4/17. Given the chronicity, I think that she is unlikely to successfully hold NSR long-term.  HR in 50s today after cutting back on metoprolol (improved).  - Would hold off on cardioversion.  - Continue warfarin, goal INR 2.5-3.5.  2. Chronic diastolic CHF: With prominent RV dysfunction and severe pulmonary hypertension by echo. She is not volume overloaded by exam and filling pressures were well-compensated on 9/19 RHC.  - Continue Lasix 80 mg daily, BMET today.  3. CAD: No chest pain.  Continue atorvastatin and warfarin.  4. PAD: Significant on 7/19 but no claudication, no pedal ulcerations. She follows with Dr. [name].  5. Mechanical aortic valve: Stable on 5/19 echo.  Given co-existing atrial fibrillation, goal INR is 2.5-3.5.  - Continue warfarin.  6. Pulmonary hypertension: Moderate pulmonary hypertension on 9/19 with RV dysfunction and dilation noted on last echo. PVR only 3.1 WU but cardiac output was high.  V/Q scan in 10/19 with no evidence for chronic PEs and PFTs in 10/19 were unremarkable.  I think there is a possible  group 1 PH component though this is a borderline call as PVR is not significantly elevated.  - Will send serologic workup with SCL-70, anti-centromere Ab, ANA, RF.  - BNP today.  - 6 minute walk done today.  - I will try her on Adcirca 20 mg daily to see if she has benefit symptomatically.  Followup in pharmacy clinic in 1 month to titrate up [place].  Will repeat 6 minute walk at followup with me in 2 months.  If she benefits clinically, I will add a 2nd [place] medication.   Loralie Champagne 07/23/2018   Loralie Champagne 07/23/2018

## 2018-07-23 NOTE — Progress Notes (Signed)
Referral for Adcirca faxed to [place]

## 2018-07-23 NOTE — Progress Notes (Signed)
6 min walk completed.  Starting O2: 98% Starting pulse: 55  Ending O2: 97% Ending O2: 79  697ft (188.925m) completed in 6 min.

## 2018-07-23 NOTE — Patient Instructions (Addendum)
Start Adcirca (Tadalafil) 20 mg daily, this has to come from a specialty pharmacy ([facility]).  They will contact you before shipping the medication, if you can not afford the medication let them know and they can connect you with patient assistance.  Labs done today  Please follow up with Doroteo Bradford, our heart failure pharmacist in 1 month  Your physician recommends that you schedule a follow-up appointment in: 3 months

## 2018-07-24 LAB — ENA+DNA/DS+SJORGEN'S
Ribonucleic Protein: 0.3 AI (ref 0.0–0.9)
ds DNA Ab: 4 IU/mL (ref 0–9)

## 2018-07-24 LAB — ANTI-SCLERODERMA ANTIBODY: Scleroderma (Scl-70) (ENA) Antibody, IgG: <0.2 AI (ref 0.0–0.9)

## 2018-07-24 LAB — ANA W/REFLEX: Anti Nuclear Antibody(ANA): POSITIVE — AB

## 2018-07-24 LAB — CENTROMERE ANTIBODIES: Centromere Ab Screen: <0.2 AI (ref 0.0–0.9)

## 2018-07-24 LAB — RHEUMATOID FACTOR: Rhuematoid fact SerPl-aCnc: 36.8 IU/mL — ABNORMAL HIGH (ref 0.0–13.9)

## 2018-08-05 ENCOUNTER — Other Ambulatory Visit: Payer: Self-pay | Admitting: Cardiovascular Disease

## 2018-08-09 ENCOUNTER — Ambulatory Visit: Payer: Medicare HMO | Admitting: Podiatry

## 2018-08-09 DIAGNOSIS — H35371 Puckering of macula, right eye: Secondary | ICD-10-CM | POA: Diagnosis not present

## 2018-08-09 DIAGNOSIS — H2513 Age-related nuclear cataract, bilateral: Secondary | ICD-10-CM | POA: Diagnosis not present

## 2018-08-09 DIAGNOSIS — H43822 Vitreomacular adhesion, left eye: Secondary | ICD-10-CM | POA: Diagnosis not present

## 2018-08-10 ENCOUNTER — Other Ambulatory Visit: Payer: Self-pay | Admitting: Cardiovascular Disease

## 2018-08-12 ENCOUNTER — Ambulatory Visit (INDEPENDENT_AMBULATORY_CARE_PROVIDER_SITE_OTHER): Payer: Medicare HMO | Admitting: Pharmacist

## 2018-08-12 DIAGNOSIS — Z952 Presence of prosthetic heart valve: Secondary | ICD-10-CM

## 2018-08-12 DIAGNOSIS — I4891 Unspecified atrial fibrillation: Secondary | ICD-10-CM | POA: Diagnosis not present

## 2018-08-12 DIAGNOSIS — Z7901 Long term (current) use of anticoagulants: Secondary | ICD-10-CM

## 2018-08-12 LAB — POCT INR: INR: 3.1 — AB (ref 2.0–3.0)

## 2018-08-14 ENCOUNTER — Encounter: Payer: Self-pay | Admitting: Podiatry

## 2018-08-14 ENCOUNTER — Ambulatory Visit: Payer: Medicare HMO | Admitting: Podiatry

## 2018-08-14 DIAGNOSIS — B351 Tinea unguium: Secondary | ICD-10-CM

## 2018-08-14 DIAGNOSIS — M79674 Pain in right toe(s): Secondary | ICD-10-CM

## 2018-08-14 DIAGNOSIS — M79675 Pain in left toe(s): Secondary | ICD-10-CM | POA: Diagnosis not present

## 2018-08-14 NOTE — Patient Instructions (Signed)

## 2018-08-22 ENCOUNTER — Other Ambulatory Visit: Payer: Self-pay

## 2018-08-22 ENCOUNTER — Encounter (HOSPITAL_COMMUNITY): Payer: Self-pay | Admitting: *Deleted

## 2018-08-22 ENCOUNTER — Ambulatory Visit (HOSPITAL_COMMUNITY)
Admission: RE | Admit: 2018-08-22 | Discharge: 2018-08-22 | Disposition: A | Discharge status: Home / Self Care | Payer: Medicare HMO | Source: Ambulatory Visit | Attending: Cardiology | Admitting: Cardiology

## 2018-08-22 DIAGNOSIS — Z7901 Long term (current) use of anticoagulants: Secondary | ICD-10-CM | POA: Insufficient documentation

## 2018-08-22 DIAGNOSIS — I110 Hypertensive heart disease with heart failure: Secondary | ICD-10-CM | POA: Diagnosis not present

## 2018-08-22 DIAGNOSIS — I5032 Chronic diastolic (congestive) heart failure: Secondary | ICD-10-CM | POA: Diagnosis not present

## 2018-08-22 DIAGNOSIS — Z79899 Other long term (current) drug therapy: Secondary | ICD-10-CM | POA: Diagnosis not present

## 2018-08-22 DIAGNOSIS — I480 Paroxysmal atrial fibrillation: Secondary | ICD-10-CM | POA: Insufficient documentation

## 2018-08-22 DIAGNOSIS — I739 Peripheral vascular disease, unspecified: Secondary | ICD-10-CM | POA: Insufficient documentation

## 2018-08-22 DIAGNOSIS — I2510 Atherosclerotic heart disease of native coronary artery without angina pectoris: Secondary | ICD-10-CM | POA: Diagnosis not present

## 2018-08-22 DIAGNOSIS — Z952 Presence of prosthetic heart valve: Secondary | ICD-10-CM | POA: Diagnosis not present

## 2018-08-22 DIAGNOSIS — I272 Pulmonary hypertension, unspecified: Secondary | ICD-10-CM | POA: Diagnosis not present

## 2018-08-22 MED ORDER — HYDRALAZINE HCL 100 MG PO TABS: 100.0000 mg | ORAL_TABLET | Freq: Three times a day (TID) | ORAL | 5 refills | Status: DC

## 2018-08-22 MED ORDER — TADALAFIL (PAH) 20 MG PO TABS: 20.0000 mg | ORAL_TABLET | Freq: Every day | ORAL | 5 refills | Status: DC

## 2018-08-22 NOTE — Patient Instructions (Addendum)
It was nice to meet you today!  Please INCREASE your hydralazine to 100 mg (1 tablet) THREE TIMES DAILY.  Please START Adcirca (tadalafil) 20 mg ONCE daily.  Follow-up with the pharmacist on 09/12/18 at 11:00 am.

## 2018-08-22 NOTE — Patient Outreach (Signed)
[facility]) Care Management  08/22/2018   [name] 1940-12-25 [id number]  Subjective: Successful outreach to the patient for assessment. HIPAA verified. The patient states that she did receive the information sent to her. The patient states that she just went to the [facility] pharmacy today for them to help her with a new medication that she will start on but insurance has  denied.She denies and chest pain, shortness of breath, swelling and wheezing.  She reports that she had a fall.  She was leaning forward trying to but up some shoes.  She feels that she over extended and went down on her knees. She states that she was not injured. The patient states that her weight this morning was 129.  She states that she is trying to watch the salt in her diet and drinking water, decaffeinated tea.  The patient states that she has a follow up appointment with her PCP on the 25th of this month.  She will see about getting a flu shot at this appointment.  She has an appointment with Dr Aundra Dubin in January.     Current Medications:  Current Outpatient Medications  Medication Sig Dispense Refill  . acetaminophen (TYLENOL) 325 MG tablet Take 2 tablets (650 mg total) by mouth every 6 (six) hours as needed for mild pain, fever or headache (for pain or headaches).    . ALPRAZolam (XANAX) 0.5 MG tablet Take 0.25 mg by mouth daily.     [name] atorvastatin (LIPITOR) 80 MG tablet Take 1 tablet (80 mg total) by mouth daily. 30 tablet 0  . Cholecalciferol (VITAMIN D3) 1000 UNITS CAPS Take 1,000 Units by mouth daily.     [name] COLCRYS 0.6 MG tablet Take 0.6 mg by mouth daily as needed (gout flare).     . ferrous sulfate 325 (65 FE) MG tablet Take 1 tablet (325 mg total) by mouth 2 (two) times daily with a meal. 60 tablet 3  . furosemide (LASIX) 40 MG tablet Take 80 mg by mouth daily.    [name] glipiZIDE (GLUCOTROL XL) 2.5 MG 24 hr tablet Take 2.5 mg by mouth daily.     . hydrALAZINE (APRESOLINE) 100 MG  tablet Take 1 tablet (100 mg total) by mouth 3 (three) times daily. 90 tablet 5  . irbesartan (AVAPRO) 300 MG tablet TAKE 1 TABLET DAILY (PLEASE MAKE APPOINTMENT FOR REFILLS) 90 tablet 3  . magnesium oxide (MAG-OX) 400 (241.3 Mg) MG tablet Take 1 tablet (400 mg total) by mouth daily.    . metFORMIN (GLUCOPHAGE) 500 MG tablet Take 500 mg by mouth 2 (two) times daily with a meal.     . metoprolol tartrate (LOPRESSOR) 25 MG tablet Take 0.5 tablets (12.5 mg total) by mouth 2 (two) times daily. 30 tablet 3  . ondansetron (ZOFRAN) 4 MG tablet Take 4 mg by mouth every 8 (eight) hours as needed for nausea or vomiting.     . pantoprazole (PROTONIX) 40 MG tablet Take 40 mg by mouth daily.     Vladimir Faster Glyc-Propyl Glyc PF (SYSTANE PRESERVATIVE FREE) 0.4-0.3 % SOLN Place 1-2 drops into both eyes 3 (three) times daily as needed (for irritation).     . vitamin B-12 (CYANOCOBALAMIN) 100 MCG tablet Take 100 mcg by mouth daily.     [name] warfarin (COUMADIN) 5 MG tablet TAKE 1.5 TO 2 TABLETS DAILY AS DIRECTED BY COUMADIN CLINIC 180 tablet 0  . tadalafil, PAH, (ADCIRCA) 20 MG tablet Take 1 tablet (20 mg total)  by mouth daily. (Patient not taking: Reported on 08/22/2018) 30 tablet 5   No current facility-administered medications for this visit.     Functional Status:  In your present state of health, do you have any difficulty performing the following activities: 07/09/2018 04/04/2018  Hearing? Y Y  Comment - see ROS-- reports decreased hearing in (R) ear due to hx "inner ear in 2013"  Vision? N N  Difficulty concentrating or making decisions? N N  Walking or climbing stairs? N -  Dressing or bathing? N -  Doing errands, shopping? N Y  Comment - Family assists with transportation to provider appointments/ errands  Preparing Food and eating ? - -  Using the Toilet? - -  In the past six months, have you accidently leaked urine? - -  Do you have problems with loss of bowel control? - -  Managing your Medications?  - -  Managing your Finances? - -  Comment - -  Housekeeping or managing your Housekeeping? - -  Comment - -  Some recent data might be hidden    Fall/Depression Screening: Fall Risk  07/09/2018 06/13/2018 05/24/2018  Falls in the past year? No (No Data) (No Data)  Comment - No new or recent falls per patient report today Denies new/ recent falls  Risk for fall due to : - Medication side effect -  Risk for fall due to: Comment - - -   PHQ 2/9 Scores 07/09/2018 06/13/2018 04/04/2018  PHQ - 2 Score 0 0 0    Assessment: Patient will continue to benefit from health coach outreach for disease management and support.  THN CM Care Plan Problem One     Most Recent Value  THN Long Term Goal   In 60 days the patient will be more intereactive about diet and verbalize no admissions to the hospital  [facility] Long Term Goal Start Date  08/22/18  Interventions for Problem One Long Term Goal  Reviewed signs and symptoms of heart failure, discussed with patient chf action plan     Plan: RN Health Coach will contact patient in the month of January and patient agrees to next outreach.   Lazaro Arms RN, BSN, [place] Direct Dial:  [phone]  Fax: [phone]

## 2018-08-22 NOTE — Progress Notes (Signed)
HF MD: [facility]  HPI:  77 yo with history of PAD, CAD s/p CABG, mechanical AVR, and chronic diastolic CHF with prominent RV dysfunction was referred by Dr. [name] for evaluation of pulmonary hypertension and diastolic CHF. Patient had CABG-AVR in 10/05. She has been in chronic atrial fibrillation, it appears, since at least 4/17. She has significant PAD followed by Dr. [name].   She was admitted in 5/19 with dyspnea and marked volume overloaded. She was started on Lasix and it was titrated up. Echo showed EF 55-60% with moderate RV dilation, severe RV systolic dysfunction, and PASP 88 mmHg. [place] in 9/19 showed moderate pulmonary hypertension with near normal filling pressures. PVR was not particularly high at 3.1 WU primarily due to elevated cardiac output. V/Q scan did not show chronic PEs and PFTs were unremarkable.   RHC (9/19) with mean RA 5, PA 63/17 mean 36, mean PCWP 16, CI 4.16, PVR 3.1 WU.   She returns today for pharmacist-led pulmonary hypertension medication titration. At last clinic visit on 07/23/18, Adcirca (tadalafil) 20 mg daily was prescribed. Today, she reports that she has not started the medication due to insurance issues. She says that she got a denial letter from insurance, then an approval letter, but she has not been contacted by any pharmacy to get it filled. She does not complain of any worsening symptoms. She denies any swelling, orthopnea, or lightheadedness. Her weight has been stable at home. She does report that her blood pressure has been high with systolic in the 573U.   Shortness of breath/dyspnea on exertion? no   Orthopnea/PND? no  Edema? no  Lightheadedness/dizziness? no  Daily weights at home? Yes (128-129 lb)  Blood pressure/heart rate monitoring at home? Yes (SBP 160s)  Following low-sodium/fluid-restricted diet? yes  HF Medications: Furosemide 80 mg daily  PH Medications: none  Has the patient been experiencing any side effects to the  medications prescribed?  no  Does the patient have any problems obtaining medications due to transportation or finances?   No - [employer] Medicare Part D  Understanding of regimen: good Understanding of indications: good Potential of compliance: good Patient understands to avoid NSAIDs. Patient understands to avoid decongestants.   Pertinent Lab Values:  07/23/18: Serum creatinine 0.93, BUN 31, Potassium 4.5, Sodium 130  Vital Signs:  Weight: 132 lb (dry weight: 125 lb)  Blood pressure: 168/52 (SBP 160s at home)  Heart rate: 51 bpm  O2: 99%   Assessment: 1. Pulmonary hypertension: Moderate pulmonary hypertension on 9/19 with RV dysfunction and dilation noted on last echo. PVR only 3.1 WU but cardiac output was high. V/Q scan in 10/19 with no evidence for chronic PEs and PFTs in 10/19 were unremarkable. I think there is a possible group 1 PH component though this is a borderline call as PVR is not significantly elevated.  - Start Adcirca (tadalafil) 20 mg daily. Plan to increase dose at next visit. -Repeat 6 minute walk at followup with Dr. Aundra Dubin in 2 months.  - Basic disease state pathophysiology, medication indication, mechanism and side effects reviewed at length with patient and she verbalized understanding   2. HTN: Uncontrolled with elevated clinic BP and consistent SBP >160 at home - Increase Hydralazine to 100 mg TID.  - Continue Irbesartan 300 mg daily and metoprolol tartrate 12.5 mg BID - Patient states that when she took spironolactone in the past, she also had gallbladder issues so her nausea at the time may have been due to the gallbladder and not the  spironolactone   - She would be willing to retry spironolactone if needed for better BP control  3. Atrial fibrillation: Chronic. She has been on warfarin with mechanical aortic valve. She has been in atrial fibrillation as far back as 4/17. Given the chronicity, I think that she is unlikely to successfully  hold NSR long-term. HR in 50s today after cutting back on metoprolol (improved). - Would hold off on cardioversion.  - Continue warfarin, goal INR 2.5-3.5.   4. Chronic diastolic CHF: With prominent RV dysfunction and severe pulmonary hypertension by echo.She is not volume overloaded by exam and filling pressures were well-compensated on 9/19 RHC. - Continue Lasix 80 mg daily.  5. CAD: No chest pain. Continue atorvastatin and warfarin.   6. PAD: Significant on 7/19 but no claudication, no pedal ulcerations.She follows with Dr. [name].   7. Mechanical aortic valve: Stable on 5/19 echo. Given co-existing atrial fibrillation, goal INR is 2.5-3.5.  - Continue warfarin.  Plan: 1) Medication changes: Based on clinical presentation, vital signs and recent labs will initiate Adcirca (tadalafil) 20 mg daily and increase hydralazine to 100 mg TID. 2) Labs: PRN 3) Follow-up:  Pharmacist visit on 09/12/18 and Dr. Aundra Dubin 10/23/18   [name]. [name], PharmD, BCPS, CPP Clinical Pharmacist Phone: [phone] 08/22/2018 12:53 AM

## 2018-08-27 ENCOUNTER — Telehealth (HOSPITAL_COMMUNITY): Payer: Self-pay | Admitting: Pharmacist

## 2018-08-27 MED ORDER — SILDENAFIL CITRATE 20 MG PO TABS: 20.0000 mg | ORAL_TABLET | Freq: Three times a day (TID) | ORAL | 5 refills | Status: DC

## 2018-08-27 NOTE — Telephone Encounter (Signed)
[name] called stating that the specialty pharmacy called her and told her that her copay for tadalafil 20 mg daily would be $57/mo which she could possibly afford but it would be difficult for her. She was told there were no patient assistance foundations available at this time. Will attempt switch to sildenafil 20 mg TID to see if this would be more affordable for her.   [name]. [name], PharmD, BCPS, CPP Clinical Pharmacist Phone: [phone] 08/27/2018 2:15 PM

## 2018-08-29 ENCOUNTER — Telehealth (HOSPITAL_COMMUNITY): Payer: Self-pay

## 2018-08-29 NOTE — Telephone Encounter (Signed)
Sildenafil PA approved through 10/09/2019  Vertis Kelch, PharmD PGY1 Pharmacy Resident Phone [phone] 08/29/2018       3:51 PM

## 2018-08-31 NOTE — Progress Notes (Signed)
Subjective: [name] presents today for  painful, discolored, thick toenails which interfere with daily activities.  Pain is aggravated when wearing enclosed shoe gear and relieved with periodic professional debridement.  This pain remains on the blood thinner warfarin.  Objective:  Neurovascular status remains intact and unchanged bilaterally   Dermatological Examination: Skin with normal turgor texture and tone bilaterally Toenails 1-5 b/l discolored, thick, dystrophic with subungual debris and pain with palpation to nailbeds due to thickness of nails.  Musculoskeletal: Muscle strength 5/5 to all LE muscle groups  Assessment: Painful onychomycosis toenails 1-5 b/l and patient on blood thinner  Plan: 1. Toenails 1-5 b/l were debrided in length and girth without iatrogenic bleeding. 2. Patient to continue soft, supportive shoe gear 3. Patient to report any pedal injuries to medical professional immediately. 4. Follow up 3 months. Patient/POA to call should there be a concern in the interim.

## 2018-09-02 DIAGNOSIS — M109 Gout, unspecified: Secondary | ICD-10-CM | POA: Diagnosis not present

## 2018-09-02 DIAGNOSIS — E1151 Type 2 diabetes mellitus with diabetic peripheral angiopathy without gangrene: Secondary | ICD-10-CM | POA: Diagnosis not present

## 2018-09-02 DIAGNOSIS — I10 Essential (primary) hypertension: Secondary | ICD-10-CM | POA: Diagnosis not present

## 2018-09-02 DIAGNOSIS — M810 Age-related osteoporosis without current pathological fracture: Secondary | ICD-10-CM | POA: Diagnosis not present

## 2018-09-02 DIAGNOSIS — E7800 Pure hypercholesterolemia, unspecified: Secondary | ICD-10-CM | POA: Diagnosis not present

## 2018-09-02 DIAGNOSIS — I2789 Other specified pulmonary heart diseases: Secondary | ICD-10-CM | POA: Diagnosis not present

## 2018-09-02 DIAGNOSIS — E11319 Type 2 diabetes mellitus with unspecified diabetic retinopathy without macular edema: Secondary | ICD-10-CM | POA: Insufficient documentation

## 2018-09-02 DIAGNOSIS — Z23 Encounter for immunization: Secondary | ICD-10-CM | POA: Diagnosis not present

## 2018-09-02 DIAGNOSIS — I5032 Chronic diastolic (congestive) heart failure: Secondary | ICD-10-CM | POA: Diagnosis not present

## 2018-09-02 DIAGNOSIS — D638 Anemia in other chronic diseases classified elsewhere: Secondary | ICD-10-CM | POA: Diagnosis not present

## 2018-09-02 DIAGNOSIS — I482 Chronic atrial fibrillation, unspecified: Secondary | ICD-10-CM | POA: Diagnosis not present

## 2018-09-09 ENCOUNTER — Telehealth (HOSPITAL_COMMUNITY): Payer: Self-pay | Admitting: Pharmacist

## 2018-09-09 ENCOUNTER — Ambulatory Visit (INDEPENDENT_AMBULATORY_CARE_PROVIDER_SITE_OTHER): Payer: Medicare HMO | Admitting: Pharmacist Clinician (PhC)/ Clinical Pharmacy Specialist

## 2018-09-09 DIAGNOSIS — I4891 Unspecified atrial fibrillation: Secondary | ICD-10-CM | POA: Diagnosis not present

## 2018-09-09 DIAGNOSIS — Z7901 Long term (current) use of anticoagulants: Secondary | ICD-10-CM

## 2018-09-09 DIAGNOSIS — Z952 Presence of prosthetic heart valve: Secondary | ICD-10-CM

## 2018-09-09 LAB — POCT INR: INR: 4.3 — AB (ref 2.0–3.0)

## 2018-09-09 NOTE — Patient Instructions (Signed)
Description   No warfarin Tuesday Dec 3 or Wednesday Dec 4, then decrease dose to 1.5 tablets daily.  Repeat INR In 2 weeks

## 2018-09-09 NOTE — Telephone Encounter (Signed)
[name] called stating she could not afford the sildenafil either so she received a 30 day free trial of tadalafil 20 mg daily and applied for a grant to continue to receive it. She also stated that since starting the tadalafil she has had a headache off and on. The headache has started to get better and I advised her to take a Tylenol about 30-60 mins prior to her dose to see if this helps. I also advised her that this side effect may improve after continued use and she should call me back if it does not improve within a week.   [name]. [name], PharmD, BCPS, CPP Clinical Pharmacist Phone: [phone] 09/09/2018 10:54 AM

## 2018-09-12 ENCOUNTER — Other Ambulatory Visit (HOSPITAL_COMMUNITY): Payer: Medicare HMO

## 2018-09-16 ENCOUNTER — Other Ambulatory Visit (HOSPITAL_COMMUNITY): Payer: Self-pay | Admitting: Cardiology

## 2018-09-23 DIAGNOSIS — I10 Essential (primary) hypertension: Secondary | ICD-10-CM | POA: Diagnosis not present

## 2018-09-23 DIAGNOSIS — K219 Gastro-esophageal reflux disease without esophagitis: Secondary | ICD-10-CM | POA: Diagnosis not present

## 2018-09-23 DIAGNOSIS — Z6824 Body mass index (BMI) 24.0-24.9, adult: Secondary | ICD-10-CM | POA: Diagnosis not present

## 2018-09-23 DIAGNOSIS — M2669 Other specified disorders of temporomandibular joint: Secondary | ICD-10-CM | POA: Diagnosis not present

## 2018-09-23 DIAGNOSIS — R0989 Other specified symptoms and signs involving the circulatory and respiratory systems: Secondary | ICD-10-CM | POA: Diagnosis not present

## 2018-09-24 ENCOUNTER — Ambulatory Visit (HOSPITAL_COMMUNITY)
Admission: RE | Admit: 2018-09-24 | Discharge: 2018-09-24 | Disposition: A | Discharge status: Home / Self Care | Payer: Medicare HMO | Source: Ambulatory Visit | Attending: Cardiology | Admitting: Cardiology

## 2018-09-24 ENCOUNTER — Telehealth (HOSPITAL_COMMUNITY): Payer: Self-pay

## 2018-09-24 ENCOUNTER — Ambulatory Visit (INDEPENDENT_AMBULATORY_CARE_PROVIDER_SITE_OTHER): Payer: Medicare HMO | Admitting: Pharmacist

## 2018-09-24 VITALS — BP 130/50 | HR 52 | Wt 133.0 lb

## 2018-09-24 DIAGNOSIS — Z951 Presence of aortocoronary bypass graft: Secondary | ICD-10-CM | POA: Diagnosis not present

## 2018-09-24 DIAGNOSIS — Z952 Presence of prosthetic heart valve: Secondary | ICD-10-CM

## 2018-09-24 DIAGNOSIS — I739 Peripheral vascular disease, unspecified: Secondary | ICD-10-CM | POA: Insufficient documentation

## 2018-09-24 DIAGNOSIS — Z79899 Other long term (current) drug therapy: Secondary | ICD-10-CM | POA: Diagnosis not present

## 2018-09-24 DIAGNOSIS — I5032 Chronic diastolic (congestive) heart failure: Secondary | ICD-10-CM | POA: Diagnosis not present

## 2018-09-24 DIAGNOSIS — Z7901 Long term (current) use of anticoagulants: Secondary | ICD-10-CM | POA: Insufficient documentation

## 2018-09-24 DIAGNOSIS — I110 Hypertensive heart disease with heart failure: Secondary | ICD-10-CM | POA: Insufficient documentation

## 2018-09-24 DIAGNOSIS — I272 Pulmonary hypertension, unspecified: Secondary | ICD-10-CM | POA: Diagnosis not present

## 2018-09-24 DIAGNOSIS — I10 Essential (primary) hypertension: Secondary | ICD-10-CM

## 2018-09-24 DIAGNOSIS — I4891 Unspecified atrial fibrillation: Secondary | ICD-10-CM | POA: Diagnosis not present

## 2018-09-24 DIAGNOSIS — I482 Chronic atrial fibrillation, unspecified: Secondary | ICD-10-CM | POA: Diagnosis not present

## 2018-09-24 DIAGNOSIS — I2510 Atherosclerotic heart disease of native coronary artery without angina pectoris: Secondary | ICD-10-CM | POA: Diagnosis not present

## 2018-09-24 LAB — BASIC METABOLIC PANEL
Anion gap: 12 (ref 5–15)
BUN: 38 mg/dL — AB (ref 8–23)
CO2: 19 mmol/L — ABNORMAL LOW (ref 22–32)
Calcium: 9.2 mg/dL (ref 8.9–10.3)
Chloride: 98 mmol/L (ref 98–111)
Creatinine, Ser: 1.1 mg/dL — ABNORMAL HIGH (ref 0.44–1.00)
GFR calc Af Amer: 55 mL/min — ABNORMAL LOW (ref >60)
GFR calc non Af Amer: 48 mL/min — ABNORMAL LOW (ref >60)
GLUCOSE: 131 mg/dL — AB (ref 70–99)
Potassium: 4.5 mmol/L (ref 3.5–5.1)
Sodium: 129 mmol/L — ABNORMAL LOW (ref 135–145)

## 2018-09-24 LAB — POCT INR: INR: 2.6 (ref 2.0–3.0)

## 2018-09-24 MED ORDER — SPIRONOLACTONE 25 MG PO TABS: 12.5000 mg | ORAL_TABLET | Freq: Every day | ORAL | 5 refills | Status: DC

## 2018-09-24 NOTE — Progress Notes (Addendum)
HF MD: [facility]  HPI:  77 yo with history of PAD, CAD s/p CABG, mechanical AVR, and chronic diastolic CHF with prominent RV dysfunction was referred by Dr. [name] for evaluation of pulmonary hypertension and diastolic CHF. Patient had CABG-AVR in 10/05. She has been in chronic atrial fibrillation, it appears, since at least 4/17. She has significant PAD followed by Dr. [name].   She was admitted in 5/19 with dyspnea and marked volume overloaded. She was started on Lasix and it was titrated up. Echo showed EF 55-60% with moderate RV dilation, severe RV systolic dysfunction, and PASP 88 mmHg. [place] in 9/19 showed moderate pulmonary hypertension with near normal filling pressures. PVR was not particularly high at 3.1 WU primarily due to elevated cardiac output. V/Q scan did not show chronic PEs and PFTs were unremarkable.  RHC (9/19) with mean RA 5, PA 63/17 mean 36, mean PCWP 16, CI 4.16, PVR 3.1 WU.  She returnstoday for pharmacist-led pulmonary hypertension medication titration. At last clinic visit on 11/14, Adcirca (tadalafil) 20 mg daily was prescribed and her hydralazine was increased to 100 mg TID. She reports a slight headache since initiation of Adcirca but she thinks some of this could be attributed to her TMJ. She denies any swelling, orthopnea, or lightheadedness. Her weight has been slowly trending up at home but she thinks this is diet related not fluid. She does report that her blood pressure has been high with systolic in the 517-616W.     Shortness of breath/dyspnea on exertion? no  Orthopnea/PND? no  Edema? no  Lightheadedness/dizziness? no  Daily weights at home? Yes - starting to trend up slowly up to 131 lb   Blood pressure/heart rate monitoring at home? Yes (SBP 130-150s)  Following low-sodium/fluid-restricted diet? yes  HF/HTN/PAH Medications: Adcirca 20 mg daily Furosemide 80 mg daily Hydralazine 100 mg TID Irbesartan 300 mg daily Metoprolol tartrate 12.5  mg PO BID  Has the patient been experiencing any side effects to the medications prescribed? no  Does the patient have any problems obtaining medications due to transportation or finances?No- [employer] Medicare Part D  Understanding of regimen:good Understanding of indications: good Potential of compliance:good Patient understands to avoid NSAIDs. Patient understands to avoid decongestants.   Pertinent Lab Values:  09/24/18:Serum creatinine1.10, BUN38, Potassium4.5, Sodium129  Vital Signs:  Weight: 133 lb (dry weight:125 lb)  Blood pressure: 130/50 (SBP 160s at home)  Heart rate: 52 bpm  Assessment: 1. Pulmonary hypertension: Moderate pulmonary hypertension on 9/19 with RV dysfunction and dilation noted on last echo. PVR only 3.1 WU but cardiac output was high. V/Q scan in 10/19 with no evidence for chronic PEs and PFTs in 10/19 were unremarkable. I think there is a possible group 1 PH component though this is a borderline call as PVR is not significantly elevated.  - Continue Adcirca (tadalafil) 20 mg daily -- will not increase dose for now with continued headaches  -Repeat 6 minute walk at followup with Dr. Aundra Dubin next month  2. HTN:   - Uncontrolled with elevated clinic BP and consistent SBP >160 at home - Start spironolactone 12.5 mg daily (patient states that when she took spironolactone in the past, she also had gallbladder issues so her nausea at the time may have been due to the gallbladder and not the spironolactone)  - Continue hydralazine 100 mg TID, Irbesartan 300 mg daily and metoprolol tartrate 12.5 mg BID  3. Atrial fibrillation: Chronic. She has been on warfarin with mechanical aortic valve. She has been  in atrial fibrillation as far back as 4/17. Given the chronicity, I think that she is unlikely to successfully hold NSR long-term. HR in 50s today after cutting back on metoprolol (improved). - Would hold off on cardioversion.  -  Continue warfarin, goal INR 2.5-3.5.  4. Chronic diastolic TIR:WERX prominent RV dysfunction and severe pulmonary hypertension by echo.She is not volume overloaded by exam and filling pressures were well-compensated on 9/19 RHC. - Continue Lasix 80 mg daily.  5. CAD:No chest pain. Continue atorvastatin and warfarin.  6. PAD: Significant on 7/19 but no claudication, no pedal ulcerations.She follows with Dr. [name].  7. Mechanical aortic valve:Stable on 5/19 echo. Given co-existing atrial fibrillation, goal INR is 2.5-3.5.  - Continue warfarin.  Plan: 1) Medication changes: Based on clinical presentation, vital signs and recent labs will start spironolactone 12.5 mg daily. 2) Labs: BMET today and in 2 weeks 3) Follow-up: Dr. Aundra Dubin 10/23/18   [name]. [name], PharmD, BCPS, CPP Clinical Pharmacist Phone: [phone] 09/24/2018 1:04 PM

## 2018-09-24 NOTE — Patient Instructions (Signed)
Description   Continue 1.5 tablets daily.  Repeat INR In 3 weeks.

## 2018-09-24 NOTE — Telephone Encounter (Signed)
Pt called with lab results No changes.  Careful not to push po fluid with low sodium

## 2018-09-24 NOTE — Patient Instructions (Signed)
Please START spironolactone 1/2 tablet (12.5 mg) ONCE DAILY.   Blood work today. We will call you with any changes.   You are scheduled for blood work again on 10/07/18 and with Dr. Aundra Dubin on 10/23/18.

## 2018-10-07 ENCOUNTER — Ambulatory Visit (HOSPITAL_COMMUNITY)
Admission: RE | Admit: 2018-10-07 | Discharge: 2018-10-07 | Disposition: A | Discharge status: Home / Self Care | Payer: Medicare HMO | Source: Ambulatory Visit | Attending: Cardiology | Admitting: Cardiology

## 2018-10-07 DIAGNOSIS — I5032 Chronic diastolic (congestive) heart failure: Secondary | ICD-10-CM | POA: Diagnosis not present

## 2018-10-07 LAB — BASIC METABOLIC PANEL
Anion gap: 11 (ref 5–15)
BUN: 35 mg/dL — ABNORMAL HIGH (ref 8–23)
CO2: 22 mmol/L (ref 22–32)
Calcium: 9.1 mg/dL (ref 8.9–10.3)
Chloride: 99 mmol/L (ref 98–111)
Creatinine, Ser: 1.09 mg/dL — ABNORMAL HIGH (ref 0.44–1.00)
GFR calc Af Amer: 56 mL/min — ABNORMAL LOW (ref >60)
GFR calc non Af Amer: 48 mL/min — ABNORMAL LOW (ref >60)
Glucose, Bld: 165 mg/dL — ABNORMAL HIGH (ref 70–99)
Potassium: 4 mmol/L (ref 3.5–5.1)
Sodium: 132 mmol/L — ABNORMAL LOW (ref 135–145)

## 2018-10-10 ENCOUNTER — Other Ambulatory Visit (HOSPITAL_COMMUNITY): Payer: Self-pay

## 2018-10-10 MED ORDER — TADALAFIL 20 MG PO TABS: 20.0000 mg | ORAL_TABLET | Freq: Every day | ORAL | 0 refills | Status: DC

## 2018-10-15 ENCOUNTER — Ambulatory Visit (INDEPENDENT_AMBULATORY_CARE_PROVIDER_SITE_OTHER): Payer: Medicare HMO | Admitting: Pharmacist Clinician (PhC)/ Clinical Pharmacy Specialist

## 2018-10-15 DIAGNOSIS — I4891 Unspecified atrial fibrillation: Secondary | ICD-10-CM

## 2018-10-15 DIAGNOSIS — Z952 Presence of prosthetic heart valve: Secondary | ICD-10-CM | POA: Diagnosis not present

## 2018-10-15 DIAGNOSIS — Z7901 Long term (current) use of anticoagulants: Secondary | ICD-10-CM | POA: Diagnosis not present

## 2018-10-15 LAB — POCT INR: INR: 5 — AB (ref 2.0–3.0)

## 2018-10-15 NOTE — Patient Instructions (Signed)
Description   No warfarin Wednesday Jan 8 or Thursday Jan 9, then decrease dose to 1 tablet each Monday and Friday, 1.5 tablets all other days. Repeat INR in 2 weeks

## 2018-10-22 ENCOUNTER — Other Ambulatory Visit: Payer: Self-pay

## 2018-10-22 NOTE — Patient Outreach (Signed)
[facility]) Care Management  10/22/2018   CANDIA KINGSBURY 16-Oct-1940 [id number]  Subjective: Successful outreach to the patient for assessment.  HIPAA verified. The patient states that she is doing well.  She denies and pain or falls. She states that she has some shortness of breath with exertion.  A little of swelling in her left ankle from time to time.  Her weight today is 131 lbs.  She is monitoring her diet.  She is taking her medications as prescribed. She states that she is still having trouble with her stomach and diarrhea.  She has an appointment tomorrow with Dr Aundra Dubin tomorrow.   Current Medications:  Current Outpatient Medications  Medication Sig Dispense Refill  . acetaminophen (TYLENOL) 325 MG tablet Take 2 tablets (650 mg total) by mouth every 6 (six) hours as needed for mild pain, fever or headache (for pain or headaches).    . ALPRAZolam (XANAX) 0.5 MG tablet Take 0.25 mg by mouth daily.     [name] atorvastatin (LIPITOR) 80 MG tablet Take 1 tablet (80 mg total) by mouth daily. 30 tablet 0  . Cholecalciferol (VITAMIN D3) 1000 UNITS CAPS Take 1,000 Units by mouth daily.     [name] COLCRYS 0.6 MG tablet Take 0.6 mg by mouth daily as needed (gout flare).     . ferrous sulfate 325 (65 FE) MG tablet Take 1 tablet (325 mg total) by mouth 2 (two) times daily with a meal. 60 tablet 3  . furosemide (LASIX) 40 MG tablet Take 80 mg by mouth daily.    . hydrALAZINE (APRESOLINE) 100 MG tablet Take 1 tablet (100 mg total) by mouth 3 (three) times daily. 90 tablet 5  . irbesartan (AVAPRO) 300 MG tablet TAKE 1 TABLET DAILY (PLEASE MAKE APPOINTMENT FOR REFILLS) 90 tablet 3  . magnesium oxide (MAG-OX) 400 (241.3 Mg) MG tablet Take 1 tablet (400 mg total) by mouth daily.    . metFORMIN (GLUCOPHAGE) 500 MG tablet Take 500 mg by mouth 2 (two) times daily with a meal.     . metoprolol tartrate (LOPRESSOR) 25 MG tablet TAKE ONE-HALF TABLET BY MOUTH TWICE A DAY 90 tablet 2  . ondansetron  (ZOFRAN) 4 MG tablet Take 4 mg by mouth every 8 (eight) hours as needed for nausea or vomiting.     . pantoprazole (PROTONIX) 40 MG tablet Take 40 mg by mouth daily.     Vladimir Faster Glyc-Propyl Glyc PF (SYSTANE PRESERVATIVE FREE) 0.4-0.3 % SOLN Place 1-2 drops into both eyes 3 (three) times daily as needed (for irritation).     [name] spironolactone (ALDACTONE) 25 MG tablet Take 0.5 tablets (12.5 mg total) by mouth daily. 15 tablet 5  . tadalafil (ADCIRCA/CIALIS) 20 MG tablet Take 1 tablet (20 mg total) by mouth daily. 30 tablet 0  . vitamin B-12 (CYANOCOBALAMIN) 100 MCG tablet Take 100 mcg by mouth daily.     [name] warfarin (COUMADIN) 5 MG tablet TAKE 1.5 TO 2 TABLETS DAILY AS DIRECTED BY COUMADIN CLINIC 180 tablet 0   No current facility-administered medications for this visit.     Functional Status:  In your present state of health, do you have any difficulty performing the following activities: 07/09/2018 04/04/2018  Hearing? Y Y  Comment - see ROS-- reports decreased hearing in (R) ear due to hx "inner ear in 2013"  Vision? N N  Difficulty concentrating or making decisions? N N  Walking or climbing stairs? N -  Dressing or bathing? N -  Doing errands, shopping? N Y  Comment - Family assists with transportation to provider appointments/ errands  Preparing Food and eating ? - -  Using the Toilet? - -  In the past six months, have you accidently leaked urine? - -  Do you have problems with loss of bowel control? - -  Managing your Medications? - -  Managing your Finances? - -  Comment - -  Housekeeping or managing your Housekeeping? - -  Comment - -  Some recent data might be hidden    Fall/Depression Screening: Fall Risk  10/22/2018 08/22/2018 07/09/2018  Falls in the past year? 0 1 No  Comment - - -  Number falls in past yr: - 0 -  Injury with Fall? - 0 -  Risk for fall due to : - - -  Risk for fall due to: Comment - - -   PHQ 2/9 Scores 07/09/2018 06/13/2018 04/04/2018  PHQ - 2 Score 0  0 0    Assessment: Patient will continue to benefit from health coach outreach for disease management and support.  THN CM Care Plan Problem One     Most Recent Value  THN Long Term Goal   In 60 days the patient will be more intereactive about diet and verbalize no admissions to the hospital  [facility] Long Term Goal Start Date  10/22/18  Interventions for Problem One Long Term Goal  Discussed diet and ways to monitor salt.  signs and symptoms of heart failure, action plan and medication adherence.       Plan: RN Health Coach will contact patient in the month of March  and patient agrees to next outreach.   Lazaro Arms RN, BSN, [place] Direct Dial:  [phone]  Fax: [phone]

## 2018-10-23 ENCOUNTER — Ambulatory Visit (HOSPITAL_COMMUNITY)
Admission: RE | Admit: 2018-10-23 | Discharge: 2018-10-23 | Disposition: A | Discharge status: Home / Self Care | Payer: Medicare HMO | Source: Ambulatory Visit | Attending: Cardiology | Admitting: Cardiology

## 2018-10-23 ENCOUNTER — Encounter (HOSPITAL_COMMUNITY): Payer: Self-pay | Admitting: Cardiology

## 2018-10-23 VITALS — BP 140/58 | HR 57 | Wt 134.4 lb

## 2018-10-23 DIAGNOSIS — I4510 Unspecified right bundle-branch block: Secondary | ICD-10-CM | POA: Diagnosis not present

## 2018-10-23 DIAGNOSIS — R001 Bradycardia, unspecified: Secondary | ICD-10-CM | POA: Diagnosis not present

## 2018-10-23 DIAGNOSIS — I5032 Chronic diastolic (congestive) heart failure: Secondary | ICD-10-CM

## 2018-10-23 DIAGNOSIS — I272 Pulmonary hypertension, unspecified: Secondary | ICD-10-CM | POA: Diagnosis not present

## 2018-10-23 LAB — CBC
HCT: 26.2 % — ABNORMAL LOW (ref 36.0–46.0)
Hemoglobin: 8.4 g/dL — ABNORMAL LOW (ref 12.0–15.0)
MCH: 31.2 pg (ref 26.0–34.0)
MCHC: 32.1 g/dL (ref 30.0–36.0)
MCV: 97.4 fL (ref 80.0–100.0)
Platelets: 194 10*3/uL (ref 150–400)
RBC: 2.69 MIL/uL — ABNORMAL LOW (ref 3.87–5.11)
RDW: 14 % (ref 11.5–15.5)
WBC: 5 10*3/uL (ref 4.0–10.5)
nRBC: 0 % (ref 0.0–0.2)

## 2018-10-23 MED ORDER — TADALAFIL 20 MG PO TABS: 40.0000 mg | ORAL_TABLET | Freq: Every day | ORAL | 0 refills | Status: DC

## 2018-10-23 MED ORDER — TADALAFIL (PAH) 20 MG PO TABS: 40.0000 mg | ORAL_TABLET | Freq: Every day | ORAL | 3 refills | Status: DC

## 2018-10-23 NOTE — Progress Notes (Signed)
6 Minute walk was completed. Pt walked  183 meters. HR ranged from 68-80bpm and O2 Sat ranged from 96-98%. Pt denies any trouble ambulating or SOB.

## 2018-10-23 NOTE — Patient Instructions (Signed)
Labs were done today. We will call you with any ABNORMAL results. No news is good news.  INCREASE Adcirca to 40mg  every day.  A 6 minute walk was completed today in this office. This is done to assess how well your body can stay oxygenated while exercising.  Your physician recommends that you schedule a follow-up appointment in 3 months.

## 2018-10-24 ENCOUNTER — Telehealth (HOSPITAL_COMMUNITY): Payer: Self-pay | Admitting: *Deleted

## 2018-10-24 DIAGNOSIS — D649 Anemia, unspecified: Secondary | ICD-10-CM

## 2018-10-24 LAB — CYCLIC CITRUL PEPTIDE ANTIBODY, IGG/IGA: CCP Antibodies IgG/IgA: 115 units — ABNORMAL HIGH (ref 0–19)

## 2018-10-24 NOTE — Telephone Encounter (Signed)
Notes recorded by Harvie Junior, CMA on 10/24/2018 at 12:37 PM EST Left VM for pt to call for results. Referral placed with GI. ------  Notes recorded by [name], MD on 10/24/2018 at 8:57 AM EST Hemoglobin is a little lower. Suspect she needs a GI evaluation.

## 2018-10-24 NOTE — Progress Notes (Signed)
PCP: Dr. Osborne Casco Cardiology: Dr. [name] HF Cardiology: Dr. Aundra Dubin  78 yo with history of PAD, CAD s/p CABG, mechanical AVR, and chronic diastolic CHF with prominent RV dysfunction was referred by Dr. [name] for evaluation of pulmonary hypertension and diastolic CHF. Patient had CABG-AVR in 10/05.  She has been in chronic atrial fibrillation, it appears, since at least 4/17.  She has significant PAD followed by Dr. [name].    She was admitted in 5/19 with dyspnea and marked volume overloaded. She was started on Lasix and it was titrated up.  Echo showed EF 55-60% with moderate RV dilation, severe RV systolic dysfunction, and PASP 88 mmHg.  [place] in 9/19 showed moderate pulmonary hypertension with near normal filling pressures. PVR was not particularly high at 3.1 WU primarily due to elevated cardiac output. V/Q scan did not show chronic PEs and PFTs were unremarkable.   She returns for followup of pulmonary hypertension and diastolic CHF.  She is now on Adcirca.  She is not feeling any different on Adcirca though she says that she feels "fine."  6 minute walk is unchanged.  She is short of breath with heavy housework like vacuuming. No dyspnea walking on flat ground.  No chest pain.  No BRBPR/melena. She has chronic pain in her finger joints.   ECG (personally reviewed): sinus brady at 48, 1st degree AVB 216 msec  Labs (8/19): K 4.2, creatinine 0.8, LDL 56 Labs (9/19): K 4.3, Na 129, creatinine 0.87, hgb 9.8 Labs (10/19): ANA+, RF 36.8 (elevated), SCL-70 negative, anti-centromere Ab negative, ds-DNA negative.  Labs (12/19): K 4, creatinine 1.09  6 min walk (10/19): 189 m 6 min walk (1/20): 183 m  PMH: 1. Mechanical [employer] Jude aortic valve 10/05: Goal INR 2.5-3.5.  2. CAD: s/p CABG x 2 with AVR in 10/05.  - Cardiolite in 2014 showed no ischemia.  3. HTN 4. Hyperlipidemia 5. Type 2 diabetes 6. PAD: Peripheral arterial dopplers (7/19) with >50% CIA stenosis on right, 70-99% CIA stenosis on the left.   7. H/o CCY 8. Atrial fibrillation: Chronic since at least 4/17. 9. Chronic diastolic CHF with prominent RV dysfunction: Echo (5/19) with EF 56-43%, grade 3 diastolic dysfunction, mechanical aortic valve with mean gradient 10 mmHg, mild MR, moderate RV dilation with moderate-severely decreased RV systolic function, biatrial enlargement, PASP 88 mmHg.  10. Carotid stenosis: 40-59% BICA stenosis on 7/19 carotid dopplers.  11. Pulmonary hypertension: RHC (9/19) with mean RA 5, PA 63/17 mean 36, mean PCWP 16, CI 4.16, PVR 3.1 WU.  - V/Q scan (10/19): no evidence for chronic PE.  - PFTs (10/19): minimal obstruction.  - ANA+, RF 36.8 (elevated), SCL-70 negative, anti-centromere Ab negative, ds-DNA negative.   Social History   Socioeconomic History  . Marital status: Widowed    Spouse name: Not on file  . Number of children: Not on file  . Years of education: Not on file  . Highest education level: Not on file  Occupational History  . Occupation: Retired  Scientific laboratory technician  . Financial resource strain: Not on file  . Food insecurity:    Worry: Not on file    Inability: Not on file  . Transportation needs:    Medical: Not on file    Non-medical: Not on file  Tobacco Use  . Smoking status: Former Smoker    Packs/day: 1.00    Years: 30.00    Pack years: 30.00    Types: Cigarettes  . Smokeless tobacco: Never Used  Substance and  Sexual Activity  . Alcohol use: No    Alcohol/week: 0.0 standard drinks  . Drug use: No  . Sexual activity: Not on file  Lifestyle  . Physical activity:    Days per week: Not on file    Minutes per session: Not on file  . Stress: Not on file  Relationships  . Social connections:    Talks on phone: Not on file    Gets together: Not on file    Attends religious service: Not on file    Active member of club or organization: Not on file    Attends meetings of clubs or organizations: Not on file    Relationship status: Not on file  . Intimate partner violence:     Fear of current or ex partner: Not on file    Emotionally abused: Not on file    Physically abused: Not on file    Forced sexual activity: Not on file  Other Topics Concern  . Not on file  Social History Narrative  . Not on file   Family History  Problem Relation Age of Onset  . Breast cancer Neg Hx    ROS: All systems reviewed and negative except as per HPI.   Current Outpatient Medications  Medication Sig Dispense Refill  . acetaminophen (TYLENOL) 325 MG tablet Take 2 tablets (650 mg total) by mouth every 6 (six) hours as needed for mild pain, fever or headache (for pain or headaches).    . ALPRAZolam (XANAX) 0.5 MG tablet Take 0.25 mg by mouth daily.     [name] atorvastatin (LIPITOR) 80 MG tablet Take 1 tablet (80 mg total) by mouth daily. 30 tablet 0  . Cholecalciferol (VITAMIN D3) 1000 UNITS CAPS Take 1,000 Units by mouth daily.     . ferrous sulfate 325 (65 FE) MG tablet Take 1 tablet (325 mg total) by mouth 2 (two) times daily with a meal. 60 tablet 3  . furosemide (LASIX) 40 MG tablet Take 80 mg by mouth daily.    . hydrALAZINE (APRESOLINE) 100 MG tablet Take 1 tablet (100 mg total) by mouth 3 (three) times daily. 90 tablet 5  . irbesartan (AVAPRO) 300 MG tablet TAKE 1 TABLET DAILY (PLEASE MAKE APPOINTMENT FOR REFILLS) 90 tablet 3  . magnesium oxide (MAG-OX) 400 (241.3 Mg) MG tablet Take 1 tablet (400 mg total) by mouth daily.    . metFORMIN (GLUCOPHAGE) 500 MG tablet Take 500 mg by mouth 2 (two) times daily with a meal.     . metoprolol tartrate (LOPRESSOR) 25 MG tablet TAKE ONE-HALF TABLET BY MOUTH TWICE A DAY 90 tablet 2  . ondansetron (ZOFRAN) 4 MG tablet Take 4 mg by mouth every 8 (eight) hours as needed for nausea or vomiting.     . pantoprazole (PROTONIX) 40 MG tablet Take 40 mg by mouth daily.     [name] spironolactone (ALDACTONE) 25 MG tablet Take 0.5 tablets (12.5 mg total) by mouth daily. 15 tablet 5  . tadalafil (ADCIRCA/CIALIS) 20 MG tablet Take 2 tablets (40 mg total)  by mouth daily. 30 tablet 0  . vitamin B-12 (CYANOCOBALAMIN) 100 MCG tablet Take 100 mcg by mouth daily.     [name] warfarin (COUMADIN) 5 MG tablet TAKE 1.5 TO 2 TABLETS DAILY AS DIRECTED BY COUMADIN CLINIC 180 tablet 0  . COLCRYS 0.6 MG tablet Take 0.6 mg by mouth daily as needed (gout flare).     Vladimir Faster Glyc-Propyl Glyc PF (SYSTANE PRESERVATIVE FREE) 0.4-0.3 % SOLN Place  1-2 drops into both eyes 3 (three) times daily as needed (for irritation).     . tadalafil, PAH, (ADCIRCA) 20 MG tablet Take 2 tablets (40 mg total) by mouth daily. 60 tablet 3   No current facility-administered medications for this encounter.    BP (!) 140/58   Pulse (!) 57   Wt 61 kg (134 lb 6.4 oz)   SpO2 99%   BMI 25.39 kg/m  General: NAD Neck: No JVD, no thyromegaly or thyroid nodule.  Lungs: Clear to auscultation bilaterally with normal respiratory effort. CV: Nondisplaced PMI.  Heart regular S1/S2 with mechanical S2, no S3/S4, 1/6 SEM RUSB.  No peripheral edema.  No carotid bruit.  Normal pedal pulses.  Abdomen: Soft, nontender, no hepatosplenomegaly, no distention.  Skin: Intact without lesions or rashes.  Neurologic: Alert and oriented x 3.  Psych: Normal affect. Extremities: No clubbing or cyanosis.  HEENT: Normal.   Assessment/Plan: 1. Atrial fibrillation: Chronic.  She has been on warfarin with mechanical aortic valve.  She has been in atrial fibrillation as far back as 4/17. Given the chronicity, I think that she is unlikely to successfully hold NSR long-term.  She is actually in NSR today.    - Can continue low dose metoprolol but need to be careful with HR around 50.  Would hold off on anti-arrhythmic meds.   - Continue warfarin, goal INR 2.5-3.5.  2. Chronic diastolic CHF: With prominent RV dysfunction and severe pulmonary hypertension by echo. She is not volume overloaded by exam and filling pressures were well-compensated on 9/19 RHC.  - Continue Lasix 80 mg daily, recent BMET with stable  creatinine.  3. CAD: No chest pain.  Continue atorvastatin and warfarin.  4. PAD: Significant on 7/19 but no claudication, no pedal ulcerations. She follows with Dr. [name].  5. Mechanical aortic valve: Stable on 5/19 echo.  Given co-existing atrial fibrillation, goal INR is 2.5-3.5.  - Continue warfarin.  6. Pulmonary hypertension: Moderate pulmonary hypertension on 9/19 with RV dysfunction and dilation noted on last echo. PVR only 3.1 WU but cardiac output was high.  V/Q scan in 10/19 with no evidence for chronic PEs and PFTs in 10/19 were unremarkable.  I think there is a possible group 1 PH component though this is a borderline call as PVR is not significantly elevated. ANA and RF positive.  6 minute walk unchanged on low dose Adcirca.  - Send CCP given elevated RF.  I will refer her for rheumatology evaluation with positive RF and ANA and pulmonary hypertension.    - Increase Adcirca to goal 40 mg daily to see if she has any further benefit symptomatically.  If she seems to benefit clinically, I will add a 2nd [place] medication. Repeat 6 minute walk at followup.   Followup 3 months.   Loralie Champagne 10/24/2018

## 2018-10-24 NOTE — Telephone Encounter (Signed)
-----   Message from [name], MD sent at 10/24/2018  8:57 AM EST ----- Hemoglobin is a little lower. Suspect she needs a GI evaluation.

## 2018-10-31 ENCOUNTER — Telehealth (HOSPITAL_COMMUNITY): Payer: Self-pay | Admitting: *Deleted

## 2018-10-31 NOTE — Telephone Encounter (Signed)
-----   Message from [name], MD sent at 10/25/2018  8:55 AM EST ----- CCP elevated, suspect rheumatoid arthritis.  Make sure she has appointment with rheumatology, Dr. Amil Amen.

## 2018-11-01 ENCOUNTER — Ambulatory Visit (INDEPENDENT_AMBULATORY_CARE_PROVIDER_SITE_OTHER): Payer: Medicare HMO | Admitting: *Deleted

## 2018-11-01 DIAGNOSIS — I4891 Unspecified atrial fibrillation: Secondary | ICD-10-CM | POA: Diagnosis not present

## 2018-11-01 DIAGNOSIS — Z7901 Long term (current) use of anticoagulants: Secondary | ICD-10-CM | POA: Diagnosis not present

## 2018-11-01 DIAGNOSIS — Z952 Presence of prosthetic heart valve: Secondary | ICD-10-CM | POA: Diagnosis not present

## 2018-11-01 LAB — POCT INR: INR: 2.7 (ref 2.0–3.0)

## 2018-11-01 NOTE — Patient Instructions (Signed)
Description   Continue taking 1.5 tablets daily except 1 tablet on Mondays and Fridays.  Repeat INR in 2 weeks

## 2018-11-06 DIAGNOSIS — Z7901 Long term (current) use of anticoagulants: Secondary | ICD-10-CM | POA: Diagnosis not present

## 2018-11-06 DIAGNOSIS — K529 Noninfective gastroenteritis and colitis, unspecified: Secondary | ICD-10-CM | POA: Diagnosis not present

## 2018-11-06 DIAGNOSIS — Z9049 Acquired absence of other specified parts of digestive tract: Secondary | ICD-10-CM | POA: Diagnosis not present

## 2018-11-06 DIAGNOSIS — D500 Iron deficiency anemia secondary to blood loss (chronic): Secondary | ICD-10-CM | POA: Diagnosis not present

## 2018-11-08 ENCOUNTER — Other Ambulatory Visit (HOSPITAL_COMMUNITY): Payer: Self-pay | Admitting: Cardiology

## 2018-11-10 ENCOUNTER — Other Ambulatory Visit: Payer: Self-pay | Admitting: Cardiovascular Disease

## 2018-11-13 ENCOUNTER — Ambulatory Visit: Payer: Medicare HMO | Admitting: Podiatry

## 2018-11-13 DIAGNOSIS — M79675 Pain in left toe(s): Secondary | ICD-10-CM | POA: Diagnosis not present

## 2018-11-13 DIAGNOSIS — B351 Tinea unguium: Secondary | ICD-10-CM

## 2018-11-13 DIAGNOSIS — M79674 Pain in right toe(s): Secondary | ICD-10-CM

## 2018-11-13 NOTE — Patient Instructions (Signed)

## 2018-11-15 DIAGNOSIS — Z7901 Long term (current) use of anticoagulants: Secondary | ICD-10-CM | POA: Diagnosis not present

## 2018-11-15 DIAGNOSIS — I482 Chronic atrial fibrillation, unspecified: Secondary | ICD-10-CM | POA: Diagnosis not present

## 2018-11-15 DIAGNOSIS — K219 Gastro-esophageal reflux disease without esophagitis: Secondary | ICD-10-CM | POA: Diagnosis not present

## 2018-11-15 DIAGNOSIS — H9193 Unspecified hearing loss, bilateral: Secondary | ICD-10-CM | POA: Diagnosis not present

## 2018-11-15 DIAGNOSIS — Z6824 Body mass index (BMI) 24.0-24.9, adult: Secondary | ICD-10-CM | POA: Diagnosis not present

## 2018-11-15 DIAGNOSIS — Z952 Presence of prosthetic heart valve: Secondary | ICD-10-CM | POA: Diagnosis not present

## 2018-11-15 DIAGNOSIS — K625 Hemorrhage of anus and rectum: Secondary | ICD-10-CM | POA: Diagnosis not present

## 2018-11-18 ENCOUNTER — Encounter: Payer: Self-pay | Admitting: Podiatry

## 2018-11-18 NOTE — Progress Notes (Signed)
Subjective: [name] is a 78 y.o. y.o. female who presents today with painful, discolored, thick toenails  which interfere with daily activities. Pain is aggravated when wearing enclosed shoe gear. Pain is relieved with periodic professional debridement.   Patient's PCP is [name], [name], MD .  [name] voices no new pedal concerns on today's visit.   Current Outpatient Medications:  .  acetaminophen (TYLENOL) 325 MG tablet, Take 2 tablets (650 mg total) by mouth every 6 (six) hours as needed for mild pain, fever or headache (for pain or headaches)., Disp: , Rfl:  .  ALPRAZolam (XANAX) 0.5 MG tablet, Take 0.25 mg by mouth daily. , Disp: , Rfl:  .  atorvastatin (LIPITOR) 80 MG tablet, Take 1 tablet (80 mg total) by mouth daily., Disp: 30 tablet, Rfl: 0 .  Cholecalciferol (VITAMIN D3) 1000 UNITS CAPS, Take 1,000 Units by mouth daily. , Disp: , Rfl:  .  COLCRYS 0.6 MG tablet, Take 0.6 mg by mouth daily as needed (gout flare). , Disp: , Rfl:  .  ferrous sulfate 325 (65 FE) MG tablet, Take 1 tablet (325 mg total) by mouth 2 (two) times daily with a meal., Disp: 60 tablet, Rfl: 3 .  furosemide (LASIX) 40 MG tablet, Take 80 mg by mouth daily., Disp: , Rfl:  .  hydrALAZINE (APRESOLINE) 100 MG tablet, Take 1 tablet (100 mg total) by mouth 3 (three) times daily., Disp: 90 tablet, Rfl: 5 .  irbesartan (AVAPRO) 300 MG tablet, TAKE 1 TABLET DAILY (PLEASE MAKE APPOINTMENT FOR REFILLS), Disp: 90 tablet, Rfl: 3 .  magnesium oxide (MAG-OX) 400 (241.3 Mg) MG tablet, Take 1 tablet (400 mg total) by mouth daily., Disp: , Rfl:  .  metFORMIN (GLUCOPHAGE) 500 MG tablet, Take 500 mg by mouth 2 (two) times daily with a meal. , Disp: , Rfl:  .  metoprolol tartrate (LOPRESSOR) 25 MG tablet, TAKE ONE-HALF TABLET BY MOUTH TWICE A DAY, Disp: 90 tablet, Rfl: 2 .  ondansetron (ZOFRAN) 4 MG tablet, Take 4 mg by mouth every 8 (eight) hours as needed for nausea or vomiting. , Disp: , Rfl:  .  pantoprazole (PROTONIX)  40 MG tablet, Take 40 mg by mouth daily. , Disp: , Rfl:  .  Polyethyl Glyc-Propyl Glyc PF (SYSTANE PRESERVATIVE FREE) 0.4-0.3 % SOLN, Place 1-2 drops into both eyes 3 (three) times daily as needed (for irritation). , Disp: , Rfl:  .  spironolactone (ALDACTONE) 25 MG tablet, Take 0.5 tablets (12.5 mg total) by mouth daily., Disp: 15 tablet, Rfl: 5 .  tadalafil (ADCIRCA/CIALIS) 20 MG tablet, TAKE ONE TABLET BY MOUTH DAILY, Disp: 30 tablet, Rfl: 0 .  tadalafil, PAH, (ADCIRCA) 20 MG tablet, Take 2 tablets (40 mg total) by mouth daily., Disp: 60 tablet, Rfl: 3 .  vitamin B-12 (CYANOCOBALAMIN) 100 MCG tablet, Take 100 mcg by mouth daily. , Disp: , Rfl:  .  warfarin (COUMADIN) 5 MG tablet, TAKE 1.5 TO 2 TABLETS BY MOUTH DAILY AS DIRECTED BY COUMADIN CLINIC, Disp: 135 tablet, Rfl: 0   Allergies  Allergen Reactions  . Flagyl [Metronidazole] Rash    ALL-OVER BODY RASH  . Coreg [Carvedilol] Other (See Comments)    Terrible cramping in the feet and had a lot of bowel movements, but not diarrhea  . Losartan Swelling    Patient doesn't recall site of swelling  . Spironolactone Nausea Only    Caused extreme nausea  . Verapamil Hives  . Zetia [Ezetimibe] Other (See Comments)  Reaction not recalled  . Zocor [Simvastatin - High Dose] Other (See Comments)    Reaction not recalled     Objective: Vascular Examination: Capillary refill time <3 seconds x 10 digits Dorsalis pedis pulses palpable b/l Posterior tibial pulses palpable b/l No digital hair x 10 digits Skin temperature gradient WNL b/l  Dermatological Examination: Pedal skin with normal turgor, texture and tone b/l LE  Toenails 1-5 b/l discolored, thick, dystrophic with subungual debris and pain with palpation to nailbeds due to thickness of nails.  Musculoskeletal: Muscle strength 5/5 to all LE muscle groups  Neurological: Sensation intact with 10 gram monofilament. Vibratory sensation intact.  Assessment: Painful onychomycosis  toenails 1-5 b/l in patient on blood thinner.   Plan: 1. Toenails 1-5 b/l were debrided in length and girth without iatrogenic bleeding. 2. Patient to continue soft, supportive shoe gear 3. Patient to report any pedal injuries to medical professional immediately. 4. Avoid self trimming due to use of blood thinner. 5. Follow up 9 weeks. 6. Patient/POA to call should there be a concern in the interim.

## 2018-11-20 ENCOUNTER — Observation Stay (HOSPITAL_COMMUNITY)
Admission: EM | Admit: 2018-11-20 | Discharge: 2018-11-22 | Disposition: A | Discharge status: Home / Self Care | Payer: Medicare HMO | Attending: Internal Medicine | Admitting: Internal Medicine

## 2018-11-20 ENCOUNTER — Emergency Department (HOSPITAL_COMMUNITY): Payer: Medicare HMO

## 2018-11-20 ENCOUNTER — Encounter (HOSPITAL_COMMUNITY): Payer: Self-pay | Admitting: Emergency Medicine

## 2018-11-20 ENCOUNTER — Other Ambulatory Visit: Payer: Self-pay

## 2018-11-20 DIAGNOSIS — E1151 Type 2 diabetes mellitus with diabetic peripheral angiopathy without gangrene: Secondary | ICD-10-CM | POA: Diagnosis not present

## 2018-11-20 DIAGNOSIS — K254 Chronic or unspecified gastric ulcer with hemorrhage: Secondary | ICD-10-CM | POA: Diagnosis not present

## 2018-11-20 DIAGNOSIS — I5032 Chronic diastolic (congestive) heart failure: Secondary | ICD-10-CM | POA: Diagnosis not present

## 2018-11-20 DIAGNOSIS — I272 Pulmonary hypertension, unspecified: Secondary | ICD-10-CM | POA: Insufficient documentation

## 2018-11-20 DIAGNOSIS — Z888 Allergy status to other drugs, medicaments and biological substances status: Secondary | ICD-10-CM | POA: Diagnosis not present

## 2018-11-20 DIAGNOSIS — E119 Type 2 diabetes mellitus without complications: Secondary | ICD-10-CM | POA: Diagnosis not present

## 2018-11-20 DIAGNOSIS — D500 Iron deficiency anemia secondary to blood loss (chronic): Secondary | ICD-10-CM | POA: Diagnosis not present

## 2018-11-20 DIAGNOSIS — E871 Hypo-osmolality and hyponatremia: Secondary | ICD-10-CM | POA: Diagnosis not present

## 2018-11-20 DIAGNOSIS — I6523 Occlusion and stenosis of bilateral carotid arteries: Secondary | ICD-10-CM

## 2018-11-20 DIAGNOSIS — K259 Gastric ulcer, unspecified as acute or chronic, without hemorrhage or perforation: Secondary | ICD-10-CM | POA: Diagnosis not present

## 2018-11-20 DIAGNOSIS — I482 Chronic atrial fibrillation, unspecified: Secondary | ICD-10-CM | POA: Insufficient documentation

## 2018-11-20 DIAGNOSIS — Z87891 Personal history of nicotine dependence: Secondary | ICD-10-CM | POA: Insufficient documentation

## 2018-11-20 DIAGNOSIS — D649 Anemia, unspecified: Secondary | ICD-10-CM | POA: Diagnosis not present

## 2018-11-20 DIAGNOSIS — Z7901 Long term (current) use of anticoagulants: Secondary | ICD-10-CM | POA: Insufficient documentation

## 2018-11-20 DIAGNOSIS — R0602 Shortness of breath: Secondary | ICD-10-CM | POA: Diagnosis not present

## 2018-11-20 DIAGNOSIS — I2510 Atherosclerotic heart disease of native coronary artery without angina pectoris: Secondary | ICD-10-CM | POA: Diagnosis not present

## 2018-11-20 DIAGNOSIS — M109 Gout, unspecified: Secondary | ICD-10-CM | POA: Diagnosis not present

## 2018-11-20 DIAGNOSIS — E7800 Pure hypercholesterolemia, unspecified: Secondary | ICD-10-CM | POA: Diagnosis not present

## 2018-11-20 DIAGNOSIS — Z952 Presence of prosthetic heart valve: Secondary | ICD-10-CM | POA: Diagnosis not present

## 2018-11-20 DIAGNOSIS — K2901 Acute gastritis with bleeding: Secondary | ICD-10-CM

## 2018-11-20 DIAGNOSIS — I4891 Unspecified atrial fibrillation: Secondary | ICD-10-CM | POA: Diagnosis present

## 2018-11-20 DIAGNOSIS — Z7984 Long term (current) use of oral hypoglycemic drugs: Secondary | ICD-10-CM | POA: Insufficient documentation

## 2018-11-20 DIAGNOSIS — K921 Melena: Secondary | ICD-10-CM | POA: Diagnosis not present

## 2018-11-20 DIAGNOSIS — K219 Gastro-esophageal reflux disease without esophagitis: Secondary | ICD-10-CM | POA: Insufficient documentation

## 2018-11-20 DIAGNOSIS — I10 Essential (primary) hypertension: Secondary | ICD-10-CM | POA: Diagnosis present

## 2018-11-20 DIAGNOSIS — Z881 Allergy status to other antibiotic agents status: Secondary | ICD-10-CM | POA: Diagnosis not present

## 2018-11-20 DIAGNOSIS — K5710 Diverticulosis of small intestine without perforation or abscess without bleeding: Secondary | ICD-10-CM | POA: Diagnosis not present

## 2018-11-20 DIAGNOSIS — K922 Gastrointestinal hemorrhage, unspecified: Secondary | ICD-10-CM | POA: Diagnosis not present

## 2018-11-20 DIAGNOSIS — I779 Disorder of arteries and arterioles, unspecified: Secondary | ICD-10-CM | POA: Diagnosis present

## 2018-11-20 DIAGNOSIS — Z951 Presence of aortocoronary bypass graft: Secondary | ICD-10-CM | POA: Insufficient documentation

## 2018-11-20 DIAGNOSIS — E785 Hyperlipidemia, unspecified: Secondary | ICD-10-CM | POA: Insufficient documentation

## 2018-11-20 DIAGNOSIS — I110 Hypertensive heart disease with heart failure: Secondary | ICD-10-CM | POA: Insufficient documentation

## 2018-11-20 DIAGNOSIS — Z79899 Other long term (current) drug therapy: Secondary | ICD-10-CM | POA: Diagnosis not present

## 2018-11-20 DIAGNOSIS — I739 Peripheral vascular disease, unspecified: Secondary | ICD-10-CM

## 2018-11-20 LAB — CBC
HEMATOCRIT: 23 % — AB (ref 36.0–46.0)
Hemoglobin: 7.2 g/dL — ABNORMAL LOW (ref 12.0–15.0)
MCH: 28.9 pg (ref 26.0–34.0)
MCHC: 31.3 g/dL (ref 30.0–36.0)
MCV: 92.4 fL (ref 80.0–100.0)
Platelets: 206 10*3/uL (ref 150–400)
RBC: 2.49 MIL/uL — ABNORMAL LOW (ref 3.87–5.11)
RDW: 13.2 % (ref 11.5–15.5)
WBC: 4.3 10*3/uL (ref 4.0–10.5)
nRBC: 0 % (ref 0.0–0.2)

## 2018-11-20 LAB — COMPREHENSIVE METABOLIC PANEL
ALT: 19 U/L (ref 0–44)
AST: 29 U/L (ref 15–41)
Albumin: 3.3 g/dL — ABNORMAL LOW (ref 3.5–5.0)
Alkaline Phosphatase: 69 U/L (ref 38–126)
Anion gap: 13 (ref 5–15)
BUN: 39 mg/dL — ABNORMAL HIGH (ref 8–23)
CHLORIDE: 96 mmol/L — AB (ref 98–111)
CO2: 19 mmol/L — ABNORMAL LOW (ref 22–32)
Calcium: 9.1 mg/dL (ref 8.9–10.3)
Creatinine, Ser: 1.24 mg/dL — ABNORMAL HIGH (ref 0.44–1.00)
GFR calc Af Amer: 48 mL/min — ABNORMAL LOW (ref >60)
GFR calc non Af Amer: 41 mL/min — ABNORMAL LOW (ref >60)
Glucose, Bld: 234 mg/dL — ABNORMAL HIGH (ref 70–99)
Potassium: 4.9 mmol/L (ref 3.5–5.1)
Sodium: 128 mmol/L — ABNORMAL LOW (ref 135–145)
Total Bilirubin: 0.7 mg/dL (ref 0.3–1.2)
Total Protein: 6.6 g/dL (ref 6.5–8.1)

## 2018-11-20 LAB — PROTIME-INR
INR: 2.04
Prothrombin Time: 22.8 seconds — ABNORMAL HIGH (ref 11.4–15.2)

## 2018-11-20 LAB — IRON AND TIBC
Iron: 104 ug/dL (ref 28–170)
Saturation Ratios: 29 % (ref 10.4–31.8)
TIBC: 360 ug/dL (ref 250–450)
UIBC: 256 ug/dL

## 2018-11-20 LAB — GLUCOSE, CAPILLARY: Glucose-Capillary: 88 mg/dL (ref 70–99)

## 2018-11-20 LAB — HEMOGLOBIN AND HEMATOCRIT, BLOOD
HCT: 28.6 % — ABNORMAL LOW (ref 36.0–46.0)
HEMOGLOBIN: 9.7 g/dL — AB (ref 12.0–15.0)

## 2018-11-20 LAB — VITAMIN B12: Vitamin B-12: 1552 pg/mL — ABNORMAL HIGH (ref 180–914)

## 2018-11-20 LAB — PREPARE RBC (CROSSMATCH)

## 2018-11-20 LAB — POC OCCULT BLOOD, ED: Fecal Occult Bld: POSITIVE — AB

## 2018-11-20 LAB — FERRITIN: Ferritin: 36 ng/mL (ref 11–307)

## 2018-11-20 MED ORDER — SODIUM CHLORIDE 0.9 % IV SOLN
80.0000 mg | Freq: Once | INTRAVENOUS | Status: AC
  Administered 2018-11-20: 80 mg via INTRAVENOUS
  Filled 2018-11-20: qty 80

## 2018-11-20 MED ORDER — TADALAFIL 20 MG PO TABS
20.0000 mg | ORAL_TABLET | Freq: Two times a day (BID) | ORAL | Status: DC
  Administered 2018-11-20 – 2018-11-22 (×2): 20 mg via ORAL
  Filled 2018-11-20 (×5): qty 1

## 2018-11-20 MED ORDER — ALPRAZOLAM 0.25 MG PO TABS
0.2500 mg | ORAL_TABLET | Freq: Every day | ORAL | Status: DC
  Administered 2018-11-20 – 2018-11-21 (×2): 0.25 mg via ORAL
  Filled 2018-11-20 (×2): qty 1

## 2018-11-20 MED ORDER — HYDROCODONE-ACETAMINOPHEN 5-325 MG PO TABS
1.0000 | ORAL_TABLET | ORAL | Status: DC | PRN
  Administered 2018-11-21: 1 via ORAL
  Filled 2018-11-20: qty 1

## 2018-11-20 MED ORDER — VITAMIN B-12 100 MCG PO TABS
100.0000 ug | ORAL_TABLET | Freq: Every day | ORAL | Status: DC
  Administered 2018-11-20 – 2018-11-22 (×2): 100 ug via ORAL
  Filled 2018-11-20 (×3): qty 1

## 2018-11-20 MED ORDER — HYDRALAZINE HCL 25 MG PO TABS
100.0000 mg | ORAL_TABLET | Freq: Three times a day (TID) | ORAL | Status: DC
  Administered 2018-11-20 – 2018-11-22 (×4): 100 mg via ORAL
  Filled 2018-11-20 (×5): qty 4

## 2018-11-20 MED ORDER — METOPROLOL TARTRATE 12.5 MG HALF TABLET
12.5000 mg | ORAL_TABLET | Freq: Two times a day (BID) | ORAL | Status: DC
  Administered 2018-11-20 – 2018-11-22 (×3): 12.5 mg via ORAL
  Filled 2018-11-20 (×4): qty 1

## 2018-11-20 MED ORDER — BISACODYL 5 MG PO TBEC: 5.0000 mg | DELAYED_RELEASE_TABLET | Freq: Every day | ORAL | Status: DC | PRN

## 2018-11-20 MED ORDER — FERROUS SULFATE 325 (65 FE) MG PO TABS
325.0000 mg | ORAL_TABLET | Freq: Two times a day (BID) | ORAL | Status: DC
  Administered 2018-11-20 – 2018-11-22 (×3): 325 mg via ORAL
  Filled 2018-11-20 (×4): qty 1

## 2018-11-20 MED ORDER — SENNOSIDES-DOCUSATE SODIUM 8.6-50 MG PO TABS: 1.0000 | ORAL_TABLET | Freq: Every evening | ORAL | Status: DC | PRN

## 2018-11-20 MED ORDER — SODIUM CHLORIDE 0.9% IV SOLUTION: Freq: Once | INTRAVENOUS | Status: DC

## 2018-11-20 MED ORDER — SPIRONOLACTONE 12.5 MG HALF TABLET
12.5000 mg | ORAL_TABLET | Freq: Every day | ORAL | Status: DC
  Administered 2018-11-22: 12.5 mg via ORAL
  Filled 2018-11-20 (×2): qty 1

## 2018-11-20 MED ORDER — ACETAMINOPHEN 650 MG RE SUPP: 650.0000 mg | Freq: Four times a day (QID) | RECTAL | Status: DC | PRN

## 2018-11-20 MED ORDER — INSULIN ASPART 100 UNIT/ML ~~LOC~~ SOLN
0.0000 [IU] | Freq: Three times a day (TID) | SUBCUTANEOUS | Status: DC
  Administered 2018-11-21: 5 [IU] via SUBCUTANEOUS
  Administered 2018-11-22: 2 [IU] via SUBCUTANEOUS

## 2018-11-20 MED ORDER — FUROSEMIDE 80 MG PO TABS
80.0000 mg | ORAL_TABLET | Freq: Every day | ORAL | Status: DC
  Administered 2018-11-21 – 2018-11-22 (×2): 80 mg via ORAL
  Filled 2018-11-20 (×2): qty 1

## 2018-11-20 MED ORDER — PANTOPRAZOLE SODIUM 40 MG IV SOLR: 40.0000 mg | Freq: Two times a day (BID) | INTRAVENOUS | Status: DC

## 2018-11-20 MED ORDER — ATORVASTATIN CALCIUM 80 MG PO TABS
80.0000 mg | ORAL_TABLET | Freq: Every day | ORAL | Status: DC
  Administered 2018-11-20 – 2018-11-21 (×2): 80 mg via ORAL
  Filled 2018-11-20 (×2): qty 1

## 2018-11-20 MED ORDER — ACETAMINOPHEN 325 MG PO TABS
650.0000 mg | ORAL_TABLET | Freq: Four times a day (QID) | ORAL | Status: DC | PRN
  Administered 2018-11-21: 650 mg via ORAL
  Filled 2018-11-20: qty 2

## 2018-11-20 MED ORDER — SODIUM CHLORIDE 0.9 % IV SOLN
8.0000 mg/h | INTRAVENOUS | Status: DC
  Administered 2018-11-20 – 2018-11-21 (×4): 8 mg/h via INTRAVENOUS
  Filled 2018-11-20 (×7): qty 80

## 2018-11-20 MED ORDER — SODIUM CHLORIDE 0.9 % IV SOLN
INTRAVENOUS | Status: DC
  Administered 2018-11-20: 21:00:00 via INTRAVENOUS

## 2018-11-20 NOTE — Consult Note (Addendum)
[facility] Consult  Referring Provider: Isla Pence, MD Primary Care Physician:  [name], MD Primary Gastroenterologist: Dr.Brahmbhatt/[facility]  Reason for Consultation:  Anemia  HPI: [name] is a 78 y.o. female was sent to the ER after outpatient labs showed a hemoglobin of 7.8. Patient is on Coumadin for mechanical aortic valve and atrial fibrillation, and has been taking iron orally twice a day. She reports that her stools have been dark since using iron but denies black stools or bloody bowel movements. For the last 3 weeks she has been bringing up "phlegm",which is brown in color, denies shortness of breath or chest pain associated with it. Since Christmas last year, she has experienced difficulty swallowing meats, this is not associated with loss of weight and is not progressive. Patient has had an EGD and a colonoscopy on 01/22/18 which were unremarkable and showed diverticulosis and hemorrhoids. Patient complains of 4-5 loose dark bowel movements, reports that her diarrhea started since cholecystectomy performed in May 2019 and has worsened in the last 4 days since starting colchicine for right ankle gout.  She denies epistaxis, bruising, hematuria or bleeding from any other orifice.  Past Medical History:  Diagnosis Date  . Aortic atherosclerosis ([place]) 01/23/2018  . Atrial fibrillation ([place])   . Carotid artery disease ([place])   . Cholelithiasis 01/23/2018  . Chronic anticoagulation   . Coronary artery disease    status post coronary artery bypass grafting times 07/10/2004  . Diabetes mellitus   . Hypercholesteremia   . Hypertension   . Mechanical heart valve present    H. aortic valve replacement at the time of bypass surgery October 2005  . Moderate to severe pulmonary hypertension ([place])   . Peripheral arterial disease ([facility])    history of left common iliac artery PTA and stenting for a chronic total occlusion 08/26/01    Past Surgical  History:  Procedure Laterality Date  . AORTIC VALVE REPLACEMENT    . CARDIAC CATHETERIZATION  11/10/2004   40% right common illiac, 70% in stent restenosis of distal left common illiac,   . CARDIAC CATHETERIZATION  05/18/2004   LAD 50-70% midstenosis, RCA dominant w/50% stenosis, 50% Right common Illiac artery ostial stenosis, 90% in stent restenosis within midportion of left common illiac stent  . Carotid Duplex  03/12/2012   RSA-elev. velocities suggestive of a 50-69% diameter reduction, Right&Left Bulb/Prox ICA-mild-mod.fibrous plaqueelevating Velocities abnormal study.  . CHOLECYSTECTOMY N/A 03/01/2018   Procedure: LAPAROSCOPIC CHOLECYSTECTOMY;  Surgeon: Judeth Horn, MD;  Location: [facility];  Service: General;  Laterality: N/A;  . COLONOSCOPY WITH PROPOFOL N/A 01/22/2018   Procedure: COLONOSCOPY WITH PROPOFOL;  Surgeon: Wilford Corner, MD;  Location: [facility];  Service: Endoscopy;  Laterality: N/A;  . ESOPHAGOGASTRODUODENOSCOPY (EGD) WITH PROPOFOL N/A 01/22/2018   Procedure: ESOPHAGOGASTRODUODENOSCOPY (EGD) WITH PROPOFOL;  Surgeon: Wilford Corner, MD;  Location: [facility];  Service: Endoscopy;  Laterality: N/A;  . Lower Ext. Duplex  03/12/2012   Right Proximal CIA- vessel narrowing w/elevated velocities 0-49% diameter reduction. Right SFA-mild mixed density plaque throughout vessel.  [name] NM MYOCAR PERF WALL MOTION  05/19/2010   protocol: Persantine, post stress EF 65%, negative for ischemia, low risk scan  . RIGHT HEART CATH N/A 06/27/2018   Procedure: RIGHT HEART CATH;  Surgeon: [name], MD;  Location: [facility];  Service: Cardiovascular;  Laterality: N/A;  . TRANSTHORACIC ECHOCARDIOGRAM  08/29/2012   Moderately calcified annulus of mitral valve, moderate regurg. of both mitral valve and tricuspid valve.  Prior to Admission medications   Medication Sig Start Date End Date Taking? Authorizing Provider  acetaminophen (TYLENOL) 325 MG tablet Take 2 tablets (650  mg total) by mouth every 6 (six) hours as needed for mild pain, fever or headache (for pain or headaches). Patient taking differently: Take 500 mg by mouth every 6 (six) hours as needed for mild pain, fever or headache (for pain or headaches).  03/03/18  Yes Rayburn, Floyce Stakes, PA-C  ALPRAZolam (XANAX) 0.5 MG tablet Take 0.25 mg by mouth at bedtime.    Yes [provider]  atorvastatin (LIPITOR) 80 MG tablet Take 1 tablet (80 mg total) by mouth daily. Patient taking differently: Take 80 mg by mouth daily at 6 PM.  09/25/17  Yes Lorretta Harp, MD  Cholecalciferol (VITAMIN D3) 1000 UNITS CAPS Take 1,000 Units by mouth daily. 12 noon   Yes [provider]  COLCRYS 0.6 MG tablet Take 0.6 mg by mouth daily as needed (gout flare).  03/22/18  Yes [provider]  ferrous sulfate 325 (65 FE) MG tablet Take 1 tablet (325 mg total) by mouth 2 (two) times daily with a meal. Patient taking differently: Take 325 mg by mouth 2 (two) times daily with a meal. Take at lunch and supper time 03/05/18  Yes Emokpae, Courage, MD  furosemide (LASIX) 40 MG tablet Take 80 mg by mouth daily.   Yes [provider]  hydrALAZINE (APRESOLINE) 100 MG tablet Take 1 tablet (100 mg total) by mouth 3 (three) times daily. 08/22/18  Yes [name], MD  irbesartan (AVAPRO) 300 MG tablet TAKE 1 TABLET DAILY (PLEASE MAKE APPOINTMENT FOR REFILLS) Patient taking differently: Take 300 mg by mouth daily.  08/06/18  Yes Lorretta Harp, MD  magnesium oxide (MAG-OX) 400 (241.3 Mg) MG tablet Take 1 tablet (400 mg total) by mouth daily. Patient taking differently: Take 400 mg by mouth daily. Take at noon 01/30/18  Yes [name], MD  metFORMIN (GLUCOPHAGE) 500 MG tablet Take 500 mg by mouth 2 (two) times daily with a meal.    Yes [provider]  metoprolol tartrate (LOPRESSOR) 25 MG tablet TAKE ONE-HALF TABLET BY MOUTH TWICE A DAY Patient taking differently: Take 12.5 mg by mouth 2  (two) times daily.  09/16/18  Yes [name], MD  ondansetron (ZOFRAN) 4 MG tablet Take 4 mg by mouth every 8 (eight) hours as needed for nausea or vomiting.  03/22/18  Yes [provider]  pantoprazole (PROTONIX) 40 MG tablet Take 40 mg by mouth 2 (two) times daily.  12/05/14  Yes [provider]  Polyethyl Glyc-Propyl Glyc PF (SYSTANE PRESERVATIVE FREE) 0.4-0.3 % SOLN Place 1-2 drops into both eyes 3 (three) times daily as needed (for irritation).    Yes [provider]  spironolactone (ALDACTONE) 25 MG tablet Take 0.5 tablets (12.5 mg total) by mouth daily. 09/24/18 12/23/18 Yes [name], MD  tadalafil (ADCIRCA/CIALIS) 20 MG tablet TAKE ONE TABLET BY MOUTH DAILY Patient taking differently: Take 20 mg by mouth 2 (two) times daily.  11/08/18  Yes [name], MD  vitamin B-12 (CYANOCOBALAMIN) 100 MCG tablet Take 100 mcg by mouth daily at 12 noon.    Yes [provider]  warfarin (COUMADIN) 5 MG tablet TAKE 1.5 TO 2 TABLETS BY MOUTH DAILY AS DIRECTED BY COUMADIN CLINIC Patient taking differently: Take 5-7.5 mg by mouth See admin instructions. Take 5 mg on Monday and Friday take in the morning Tale  7.5 mg all the other days 11/11/18  Yes Lorretta Harp, MD  tadalafil, PAH, (ADCIRCA) 20 MG tablet Take 2 tablets (40 mg total) by mouth daily. Patient not taking: Reported on 11/20/2018 10/23/18   [name], MD    Current Facility-Administered Medications  Medication Dose Route Frequency Provider Last Rate Last Dose  . 0.9 %  sodium chloride infusion (Manually program via Guardrails IV Fluids)   Intravenous Once Isla Pence, MD      . 0.9 %  sodium chloride infusion   Intravenous Continuous Amin, Ankit Chirag, MD      . acetaminophen (TYLENOL) tablet 650 mg  650 mg Oral Q6H PRN Amin, Ankit Chirag, MD       Or  . acetaminophen (TYLENOL) suppository 650 mg  650 mg Rectal Q6H PRN Amin, Ankit Chirag, MD      . ALPRAZolam Duanne Moron) tablet 0.25 mg   0.25 mg Oral QHS Amin, Ankit Chirag, MD      . atorvastatin (LIPITOR) tablet 80 mg  80 mg Oral Daily Amin, Ankit Chirag, MD      . bisacodyl (DULCOLAX) EC tablet 5 mg  5 mg Oral Daily PRN Amin, Ankit Chirag, MD      . ferrous sulfate tablet 325 mg  325 mg Oral BID WC Amin, Ankit Chirag, MD      . Derrill Memo ON 11/21/2018] furosemide (LASIX) tablet 80 mg  80 mg Oral Daily Amin, Ankit Chirag, MD      . hydrALAZINE (APRESOLINE) tablet 100 mg  100 mg Oral TID Amin, Ankit Chirag, MD      . HYDROcodone-acetaminophen (NORCO/VICODIN) 5-325 MG per tablet 1-2 tablet  1-2 tablet Oral Q4H PRN Amin, Ankit Chirag, MD      . insulin aspart (novoLOG) injection 0-15 Units  0-15 Units Subcutaneous TID WC Amin, Ankit Chirag, MD      . metoprolol tartrate (LOPRESSOR) tablet 12.5 mg  12.5 mg Oral BID Amin, Ankit Chirag, MD      . pantoprazole (PROTONIX) 80 mg in sodium chloride 0.9 % 250 mL (0.32 mg/mL) infusion  8 mg/hr Intravenous Continuous Isla Pence, MD 25 mL/hr at 11/20/18 1254 8 mg/hr at 11/20/18 1254  . [START ON 11/23/2018] pantoprazole (PROTONIX) injection 40 mg  40 mg Intravenous Q12H Isla Pence, MD      . senna-docusate (Senokot-S) tablet 1 tablet  1 tablet Oral QHS PRN Amin, Ankit Chirag, MD      . spironolactone (ALDACTONE) tablet 12.5 mg  12.5 mg Oral Daily Amin, Ankit Chirag, MD      . tadalafil (ADCIRCA/CIALIS) tablet 20 mg  20 mg Oral BID Amin, Ankit Chirag, MD      . vitamin B-12 (CYANOCOBALAMIN) tablet 100 mcg  100 mcg Oral Q1200 Amin, Jeanella Flattery, MD       Current Outpatient Medications  Medication Sig Dispense Refill  . acetaminophen (TYLENOL) 325 MG tablet Take 2 tablets (650 mg total) by mouth every 6 (six) hours as needed for mild pain, fever or headache (for pain or headaches). (Patient taking differently: Take 500 mg by mouth every 6 (six) hours as needed for mild pain, fever or headache (for pain or headaches). )    . ALPRAZolam (XANAX) 0.5 MG tablet Take 0.25 mg by mouth at bedtime.      [name] atorvastatin (LIPITOR) 80 MG tablet Take 1 tablet (80 mg total) by mouth daily. (Patient taking differently: Take 80 mg by mouth daily at 6 PM. ) 30 tablet 0  .  Cholecalciferol (VITAMIN D3) 1000 UNITS CAPS Take 1,000 Units by mouth daily. 12 noon    . COLCRYS 0.6 MG tablet Take 0.6 mg by mouth daily as needed (gout flare).     . ferrous sulfate 325 (65 FE) MG tablet Take 1 tablet (325 mg total) by mouth 2 (two) times daily with a meal. (Patient taking differently: Take 325 mg by mouth 2 (two) times daily with a meal. Take at lunch and supper time) 60 tablet 3  . furosemide (LASIX) 40 MG tablet Take 80 mg by mouth daily.    . hydrALAZINE (APRESOLINE) 100 MG tablet Take 1 tablet (100 mg total) by mouth 3 (three) times daily. 90 tablet 5  . irbesartan (AVAPRO) 300 MG tablet TAKE 1 TABLET DAILY (PLEASE MAKE APPOINTMENT FOR REFILLS) (Patient taking differently: Take 300 mg by mouth daily. ) 90 tablet 3  . magnesium oxide (MAG-OX) 400 (241.3 Mg) MG tablet Take 1 tablet (400 mg total) by mouth daily. (Patient taking differently: Take 400 mg by mouth daily. Take at noon)    . metFORMIN (GLUCOPHAGE) 500 MG tablet Take 500 mg by mouth 2 (two) times daily with a meal.     . metoprolol tartrate (LOPRESSOR) 25 MG tablet TAKE ONE-HALF TABLET BY MOUTH TWICE A DAY (Patient taking differently: Take 12.5 mg by mouth 2 (two) times daily. ) 90 tablet 2  . ondansetron (ZOFRAN) 4 MG tablet Take 4 mg by mouth every 8 (eight) hours as needed for nausea or vomiting.     . pantoprazole (PROTONIX) 40 MG tablet Take 40 mg by mouth 2 (two) times daily.     Vladimir Faster Glyc-Propyl Glyc PF (SYSTANE PRESERVATIVE FREE) 0.4-0.3 % SOLN Place 1-2 drops into both eyes 3 (three) times daily as needed (for irritation).     [name] spironolactone (ALDACTONE) 25 MG tablet Take 0.5 tablets (12.5 mg total) by mouth daily. 15 tablet 5  . tadalafil (ADCIRCA/CIALIS) 20 MG tablet TAKE ONE TABLET BY MOUTH DAILY (Patient taking differently: Take 20 mg  by mouth 2 (two) times daily. ) 30 tablet 0  . vitamin B-12 (CYANOCOBALAMIN) 100 MCG tablet Take 100 mcg by mouth daily at 12 noon.     . warfarin (COUMADIN) 5 MG tablet TAKE 1.5 TO 2 TABLETS BY MOUTH DAILY AS DIRECTED BY COUMADIN CLINIC (Patient taking differently: Take 5-7.5 mg by mouth See admin instructions. Take 5 mg on Monday and Friday take in the morning Tale 7.5 mg all the other days) 135 tablet 0  . tadalafil, PAH, (ADCIRCA) 20 MG tablet Take 2 tablets (40 mg total) by mouth daily. (Patient not taking: Reported on 11/20/2018) 60 tablet 3    Allergies as of 11/20/2018 - Review Complete 11/20/2018  Allergen Reaction Noted  . Flagyl [metronidazole] Rash 02/21/2018  . Coreg [carvedilol] Other (See Comments) 01/18/2018  . Losartan Swelling 08/22/2013  . Verapamil Hives 08/22/2013  . Zetia [ezetimibe] Other (See Comments) 10/06/2011  . Zocor [simvastatin - high dose] Other (See Comments) 10/06/2011    Family History  Problem Relation Age of Onset  . Breast cancer Neg Hx     Social History   Socioeconomic History  . Marital status: Widowed    Spouse name: Not on file  . Number of children: Not on file  . Years of education: Not on file  . Highest education level: Not on file  Occupational History  . Occupation: Retired  Scientific laboratory technician  . Financial resource strain: Not on file  . Food insecurity:  Worry: Not on file    Inability: Not on file  . Transportation needs:    Medical: Not on file    Non-medical: Not on file  Tobacco Use  . Smoking status: Former Smoker    Packs/day: 1.00    Years: 30.00    Pack years: 30.00    Types: Cigarettes  . Smokeless tobacco: Never Used  Substance and Sexual Activity  . Alcohol use: No    Alcohol/week: 0.0 standard drinks  . Drug use: No  . Sexual activity: Not on file  Lifestyle  . Physical activity:    Days per week: Not on file    Minutes per session: Not on file  . Stress: Not on file  Relationships  . Social  connections:    Talks on phone: Not on file    Gets together: Not on file    Attends religious service: Not on file    Active member of club or organization: Not on file    Attends meetings of clubs or organizations: Not on file    Relationship status: Not on file  . Intimate partner violence:    Fear of current or ex partner: Not on file    Emotionally abused: Not on file    Physically abused: Not on file    Forced sexual activity: Not on file  Other Topics Concern  . Not on file  Social History Narrative  . Not on file    Review of Systems: Positive for: GI: Described in detail in HPI.    Gen: fatigue, weakness, malaise,Denies any fever, chills, rigors, night sweats, anorexia,  involuntary weight loss, and sleep disorder CV: Denies chest pain, angina, palpitations, syncope, orthopnea, PND, peripheral edema, and claudication. Resp: brownish sputum, Denies dyspnea, cough, wheezing, coughing up blood. GU : Denies urinary burning, blood in urine, urinary frequency, urinary hesitancy, nocturnal urination, and urinary incontinence. XK:GYJEH ankle joint pain,redness and swelling.  Denies muscle weakness, cramps, atrophy.  Derm: Denies rash, itching, oral ulcerations, hives, unhealing ulcers.  Psych: Denies depression, anxiety, memory loss, suicidal ideation, hallucinations,  and confusion. Heme: Denies bruising and enlarged lymph nodes. Neuro:  Denies any headaches, dizziness, paresthesias. Endo:  DM, Denies any problems with thyroid, adrenal function.  Physical Exam: Vital signs in last 24 hours: Temp:  [97.5 F (36.4 C)-97.9 F (36.6 C)] 97.5 F (36.4 C) (02/12 1156) Pulse Rate:  [51-61] 54 (02/12 1245) Resp:  [11-19] 16 (02/12 1245) BP: (148-182)/(46-56) 154/56 (02/12 1245) SpO2:  [97 %-99 %] 98 % (02/12 1245)    General:   Alert,  Well-developed, well-nourished, pleasant and cooperative in NAD Head:  Normocephalic and atraumatic. Eyes:  Sclera clear, no icterus.Mild  pallor. Ears:  Normal auditory acuity. Nose:  No deformity, discharge,  or lesions. Mouth:  No deformity or lesions.  Oropharynx pink & moist. Neck:  Supple; no masses or thyromegaly. Lungs:  Clear throughout to auscultation.   No wheezes, crackles, or rhonchi. No acute distress. Heart:  Midline scar, Regular rate and rhythm; sinus bradycardia,prominent click, no murmurs, rubs,  or gallops. Extremities:  Right ankle erythema and tenderness. Neurologic:  Alert and  oriented x4;  grossly normal neurologically. Skin:  Intact without significant lesions or rashes. Psych:  Alert and cooperative. Normal mood and affect. Abdomen:  Soft, nontender and nondistended. No masses, hepatosplenomegaly or hernias noted. Normal bowel sounds, without guarding, and without rebound.         Lab Results: Recent Labs    11/20/18 0950  WBC  4.3  HGB 7.2*  HCT 23.0*  PLT 206   BMET Recent Labs    11/20/18 0950  NA 128*  K 4.9  CL 96*  CO2 19*  GLUCOSE 234*  BUN 39*  CREATININE 1.24*  CALCIUM 9.1   LFT Recent Labs    11/20/18 0950  PROT 6.6  ALBUMIN 3.3*  AST 29  ALT 19  ALKPHOS 69  BILITOT 0.7   PT/INR Recent Labs    11/20/18 1021  LABPROT 22.8*  INR 2.04    Studies/Results: Dg Chest Portable 1 View  Result Date: 11/20/2018 CLINICAL DATA:  Anemia. EXAM: PORTABLE CHEST 1 VIEW COMPARISON:  07/11/2018 FINDINGS: The heart is enlarged but stable. There is tortuosity and calcification of the thoracic aorta. Stable surgical changes from coronary artery bypass surgery. No acute pulmonary findings are identified. No pleural effusions. Stable calcified granulomas. IMPRESSION: Cardiac enlargement but no acute pulmonary findings. Electronically Signed   By: Marijo Sanes M.D.   On: 11/20/2018 11:25    Impression: Symptomatic anemia On Coumadin for mechanical aortic valve and history of atrial fibrillation, last dose today morning Hemoglobin 7.2 with INR of 2.04, PT 22.8 Elevated  BUNs/creatinine ratio 39/1.24  Possible upper GI/small bowel bleeding  Multiple comorbidities as listed above  Plan: Plan EGD with possible small bowel PillCam deployment in a.m. Ideally INR needs to be less than 2, recommend holding Coumadin for now, recommend using IV heparin for mechanical aortic valve, heparin needs to be on hold at least 6 hours before the planned procedure Agree with IV Protonix Clear liquid diet and nothing by mouth post midnight Patient is receiving 1 unit PRBC transfusion currently  Chest x-ray noted to be normal, if EGD and small bowel PillCam unrevealing, recommend CT of the chest as patient complains of bringing up "brown phlegm" for the last 3 weeks.   LOS: 0 days   [name], M.D.  11/20/2018, 1:17 PM  Pager [phone] If no answer or after 5 PM call [phone]

## 2018-11-20 NOTE — ED Triage Notes (Signed)
Pt was called yesterday and told her hgb was 7 and she should go to the ED- pt has blood work routinely due to last year she had to have 4 units of blood due to low hemoglobin.   Pt has no complaints denies any frank blood in stool or any known loss of blood.

## 2018-11-20 NOTE — ED Notes (Signed)
Attempted report 

## 2018-11-20 NOTE — ED Provider Notes (Signed)
[facility] EMERGENCY DEPARTMENT Provider Note   CSN: [id number] Arrival date & time: 11/20/18  8338     History   Chief Complaint Chief Complaint  Patient presents with  . Abnormal Lab    HPI [name] is a 78 y.o. female.  Pt presents to the ED today with low hemoglobin.  Pt said she received 2 units of blood in April and 2 in May of 2019.  Since then, her doctor has been monitoring her hemoglobin.  She went to her doctor on Friday, 2/7 for a blood draw.  She was told her hemoglobin was 7.8 and she needed to come to the hospital for a blood transfusion.  Pt does feel sob and weak.  No cp.  She does have a hx of afib and an artificial valve, so she is on coumadin.  She takes iron, so her stool has been black.  She did have a colonoscopy and an endoscopy last year which she said was nl.  Dr. Kathline Magic report from April of 2019:  Minimal antral gastritis on EGD. Diffuse left-sided diverticulosis and medium-sized internal hemorrhoids. Suspect anemia due to mucosal bleeding from anticoagulation. No further GI procedures planned. Follow H/Hs. Ok to resume Coumadin from GI standpoint. Heart healthy diet. F/U with GI prn. Will sign off. Call if questions.     Past Medical History:  Diagnosis Date  . Aortic atherosclerosis ([place]) 01/23/2018  . Atrial fibrillation ([place])   . Carotid artery disease ([place])   . Cholelithiasis 01/23/2018  . Chronic anticoagulation   . Coronary artery disease    status post coronary artery bypass grafting times 07/10/2004  . Diabetes mellitus   . Hypercholesteremia   . Hypertension   . Mechanical heart valve present    H. aortic valve replacement at the time of bypass surgery October 2005  . Moderate to severe pulmonary hypertension ([place])   . Peripheral arterial disease (HCC)    history of left common iliac artery PTA and stenting for a chronic total occlusion 08/26/01    Patient Active Problem List   Diagnosis Date Noted  .  Malnutrition of moderate degree 03/12/2018  . Acute on chronic diastolic heart failure ([place]) 03/06/2018  . Diabetes mellitus type 2, controlled ([place]) 03/06/2018  . Chronic atrial fibrillation 03/06/2018  . S/P cholecystectomy 03/06/2018  . HTN (hypertension) 03/06/2018  . H/O mechanical aortic valve replacement 03/06/2018  . Acute hyponatremia 03/06/2018  . Acute respiratory failure with hypoxia ([place]) 03/06/2018  . Moderate to severe pulmonary hypertension ([place]) 03/06/2018  . UTI (urinary tract infection) 02/22/2018  . Cholecystitis 02/21/2018  . GERD (gastroesophageal reflux disease) 02/21/2018  . Anxiety 02/21/2018  . Chronic diastolic CHF (congestive heart failure) ([place]) 02/21/2018  . Diarrhea 02/21/2018  . Rash 02/21/2018  . General weakness 02/02/2018  . Macular rash 01/25/2018  . Thrombocytopenia ([place]) 01/25/2018  . Arm pain, diffuse 01/25/2018  . Symptomatic anemia 01/23/2018  . Cholelithiasis 01/23/2018  . Aortic atherosclerosis ([place]) 01/23/2018  . GI bleed 01/18/2018  . Atrial fibrillation ([place]) 01/12/2016  . Coronary artery disease 08/18/2013  . Carotid artery disease ([place]) 08/18/2013  . Peripheral arterial disease ([place]) 08/18/2013  . Diabetes mellitus without complication ([place]) 25/02/3975  . Benign positional vertigo 10/06/2011  . Hyponatremia 10/06/2011  . Influenza 10/06/2011  . Sinusitis 10/06/2011  . Hypertension   . Hypercholesteremia   . Mechanical heart valve present   . Chronic anticoagulation     Past Surgical History:  Procedure Laterality Date  .  AORTIC VALVE REPLACEMENT    . CARDIAC CATHETERIZATION  11/10/2004   40% right common illiac, 70% in stent restenosis of distal left common illiac,   . CARDIAC CATHETERIZATION  05/18/2004   LAD 50-70% midstenosis, RCA dominant w/50% stenosis, 50% Right common Illiac artery ostial stenosis, 90% in stent restenosis within midportion of left common illiac stent  . Carotid Duplex  03/12/2012   RSA-elev.  velocities suggestive of a 50-69% diameter reduction, Right&Left Bulb/Prox ICA-mild-mod.fibrous plaqueelevating Velocities abnormal study.  . CHOLECYSTECTOMY N/A 03/01/2018   Procedure: LAPAROSCOPIC CHOLECYSTECTOMY;  Surgeon: Judeth Horn, MD;  Location: [facility];  Service: General;  Laterality: N/A;  . COLONOSCOPY WITH PROPOFOL N/A 01/22/2018   Procedure: COLONOSCOPY WITH PROPOFOL;  Surgeon: Wilford Corner, MD;  Location: [facility];  Service: Endoscopy;  Laterality: N/A;  . ESOPHAGOGASTRODUODENOSCOPY (EGD) WITH PROPOFOL N/A 01/22/2018   Procedure: ESOPHAGOGASTRODUODENOSCOPY (EGD) WITH PROPOFOL;  Surgeon: Wilford Corner, MD;  Location: [facility];  Service: Endoscopy;  Laterality: N/A;  . Lower Ext. Duplex  03/12/2012   Right Proximal CIA- vessel narrowing w/elevated velocities 0-49% diameter reduction. Right SFA-mild mixed density plaque throughout vessel.  [name] NM MYOCAR PERF WALL MOTION  05/19/2010   protocol: Persantine, post stress EF 65%, negative for ischemia, low risk scan  . RIGHT HEART CATH N/A 06/27/2018   Procedure: RIGHT HEART CATH;  Surgeon: [name], MD;  Location: [facility];  Service: Cardiovascular;  Laterality: N/A;  . TRANSTHORACIC ECHOCARDIOGRAM  08/29/2012   Moderately calcified annulus of mitral valve, moderate regurg. of both mitral valve and tricuspid valve.      OB History   No obstetric history on file.      Home Medications    Prior to Admission medications   Medication Sig Start Date End Date Taking? Authorizing Provider  acetaminophen (TYLENOL) 325 MG tablet Take 2 tablets (650 mg total) by mouth every 6 (six) hours as needed for mild pain, fever or headache (for pain or headaches). Patient taking differently: Take 500 mg by mouth every 6 (six) hours as needed for mild pain, fever or headache (for pain or headaches).  03/03/18  Yes Rayburn, Floyce Stakes, PA-C  ALPRAZolam (XANAX) 0.5 MG tablet Take 0.25 mg by mouth at bedtime.    Yes [provider]  atorvastatin (LIPITOR) 80 MG tablet Take 1 tablet (80 mg total) by mouth daily. Patient taking differently: Take 80 mg by mouth daily at 6 PM.  09/25/17  Yes Lorretta Harp, MD  Cholecalciferol (VITAMIN D3) 1000 UNITS CAPS Take 1,000 Units by mouth daily. 12 noon   Yes [provider]  COLCRYS 0.6 MG tablet Take 0.6 mg by mouth daily as needed (gout flare).  03/22/18  Yes [provider]  ferrous sulfate 325 (65 FE) MG tablet Take 1 tablet (325 mg total) by mouth 2 (two) times daily with a meal. Patient taking differently: Take 325 mg by mouth 2 (two) times daily with a meal. Take at lunch and supper time 03/05/18  Yes Emokpae, Courage, MD  furosemide (LASIX) 40 MG tablet Take 80 mg by mouth daily.   Yes [provider]  hydrALAZINE (APRESOLINE) 100 MG tablet Take 1 tablet (100 mg total) by mouth 3 (three) times daily. 08/22/18  Yes [name], MD  irbesartan (AVAPRO) 300 MG tablet TAKE 1 TABLET DAILY (PLEASE MAKE APPOINTMENT FOR REFILLS) Patient taking differently: Take 300 mg by mouth daily.  08/06/18  Yes Lorretta Harp, MD  magnesium oxide (MAG-OX) 400 (  241.3 Mg) MG tablet Take 1 tablet (400 mg total) by mouth daily. Patient taking differently: Take 400 mg by mouth daily. Take at noon 01/30/18  Yes [name], MD  metFORMIN (GLUCOPHAGE) 500 MG tablet Take 500 mg by mouth 2 (two) times daily with a meal.    Yes [provider]  metoprolol tartrate (LOPRESSOR) 25 MG tablet TAKE ONE-HALF TABLET BY MOUTH TWICE A DAY Patient taking differently: Take 12.5 mg by mouth 2 (two) times daily.  09/16/18  Yes [name], MD  ondansetron (ZOFRAN) 4 MG tablet Take 4 mg by mouth every 8 (eight) hours as needed for nausea or vomiting.  03/22/18  Yes [provider]  pantoprazole (PROTONIX) 40 MG tablet Take 40 mg by mouth 2 (two) times daily.  12/05/14  Yes [provider]  Polyethyl Glyc-Propyl Glyc PF (SYSTANE  PRESERVATIVE FREE) 0.4-0.3 % SOLN Place 1-2 drops into both eyes 3 (three) times daily as needed (for irritation).    Yes [provider]  spironolactone (ALDACTONE) 25 MG tablet Take 0.5 tablets (12.5 mg total) by mouth daily. 09/24/18 12/23/18 Yes [name], MD  tadalafil (ADCIRCA/CIALIS) 20 MG tablet TAKE ONE TABLET BY MOUTH DAILY Patient taking differently: Take 20 mg by mouth 2 (two) times daily.  11/08/18  Yes [name], MD  vitamin B-12 (CYANOCOBALAMIN) 100 MCG tablet Take 100 mcg by mouth daily at 12 noon.    Yes [provider]  warfarin (COUMADIN) 5 MG tablet TAKE 1.5 TO 2 TABLETS BY MOUTH DAILY AS DIRECTED BY COUMADIN CLINIC Patient taking differently: Take 5-7.5 mg by mouth See admin instructions. Take 5 mg on Monday and Friday take in the morning Tale 7.5 mg all the other days 11/11/18  Yes Lorretta Harp, MD  tadalafil, PAH, (ADCIRCA) 20 MG tablet Take 2 tablets (40 mg total) by mouth daily. Patient not taking: Reported on 11/20/2018 10/23/18   [name], MD    Family History Family History  Problem Relation Age of Onset  . Breast cancer Neg Hx     Social History Social History   Tobacco Use  . Smoking status: Former Smoker    Packs/day: 1.00    Years: 30.00    Pack years: 30.00    Types: Cigarettes  . Smokeless tobacco: Never Used  Substance Use Topics  . Alcohol use: No    Alcohol/week: 0.0 standard drinks  . Drug use: No     Allergies   Flagyl [metronidazole]; Coreg [carvedilol]; Losartan; Verapamil; Zetia [ezetimibe]; and Zocor [simvastatin - high dose]   Review of Systems Review of Systems  Respiratory: Positive for shortness of breath.   All other systems reviewed and are negative.    Physical Exam Updated Vital Signs BP (!) 154/56   Pulse (!) 54   Temp (!) 97.5 F (36.4 C)   Resp 16   SpO2 98%   Physical Exam Vitals signs and nursing note reviewed.  Constitutional:      Appearance: Normal appearance.    HENT:     Head: Normocephalic and atraumatic.     Right Ear: External ear normal.     Left Ear: External ear normal.     Nose: Nose normal.     Mouth/Throat:     Mouth: Mucous membranes are moist.  Eyes:     Pupils: Pupils are equal, round, and reactive to light.  Neck:     Musculoskeletal: Normal range of motion.  Cardiovascular:  Rate and Rhythm: Normal rate and regular rhythm.     Pulses: Normal pulses.  Pulmonary:     Effort: Pulmonary effort is normal.     Breath sounds: Normal breath sounds.  Abdominal:     General: Abdomen is flat.  Musculoskeletal: Normal range of motion.     Comments: Right ankle gout  Skin:    Capillary Refill: Capillary refill takes less than 2 seconds.  Neurological:     General: No focal deficit present.     Mental Status: She is alert and oriented to person, place, and time.  Psychiatric:        Mood and Affect: Mood normal.        Behavior: Behavior normal.      ED Treatments / Results  Labs (all labs ordered are listed, but only abnormal results are displayed) Labs Reviewed  COMPREHENSIVE METABOLIC PANEL - Abnormal; Notable for the following components:      Result Value   Sodium 128 (*)    Chloride 96 (*)    CO2 19 (*)    Glucose, Bld 234 (*)    BUN 39 (*)    Creatinine, Ser 1.24 (*)    Albumin 3.3 (*)    GFR calc non Af Amer 41 (*)    GFR calc Af Amer 48 (*)    All other components within normal limits  CBC - Abnormal; Notable for the following components:   RBC 2.49 (*)    Hemoglobin 7.2 (*)    HCT 23.0 (*)    All other components within normal limits  PROTIME-INR - Abnormal; Notable for the following components:   Prothrombin Time 22.8 (*)    All other components within normal limits  POC OCCULT BLOOD, ED - Abnormal; Notable for the following components:   Fecal Occult Bld POSITIVE (*)    All other components within normal limits  FERRITIN  FOLATE RBC  IRON AND TIBC  VITAMIN B12  TYPE AND SCREEN  PREPARE RBC  (CROSSMATCH)    EKG EKG Interpretation  Date/Time:  Wednesday November 20 2018 12:45:10 EST Ventricular Rate:  53 PR Interval:    QRS Duration: 105 QT Interval:  454 QTC Calculation: 427 R Axis:   92 Text Interpretation:  Sinus rhythm Prolonged PR interval Right axis deviation Borderline repolarization abnormality Baseline wander in lead(s) V3 V5 V6 No significant change since last tracing Confirmed by Isla Pence [phone]) on 11/20/2018 12:50:05 PM   Radiology Dg Chest Portable 1 View  Result Date: 11/20/2018 CLINICAL DATA:  Anemia. EXAM: PORTABLE CHEST 1 VIEW COMPARISON:  07/11/2018 FINDINGS: The heart is enlarged but stable. There is tortuosity and calcification of the thoracic aorta. Stable surgical changes from coronary artery bypass surgery. No acute pulmonary findings are identified. No pleural effusions. Stable calcified granulomas. IMPRESSION: Cardiac enlargement but no acute pulmonary findings. Electronically Signed   By: Marijo Sanes M.D.   On: 11/20/2018 11:25    Procedures Procedures (including critical care time)  Medications Ordered in ED Medications  0.9 %  sodium chloride infusion (Manually program via Guardrails IV Fluids) (has no administration in time range)  pantoprazole (PROTONIX) 80 mg in sodium chloride 0.9 % 250 mL (0.32 mg/mL) infusion (8 mg/hr Intravenous New Bag/Given 11/20/18 1254)  pantoprazole (PROTONIX) injection 40 mg (has no administration in time range)  0.9 %  sodium chloride infusion (has no administration in time range)  acetaminophen (TYLENOL) tablet 650 mg (has no administration in time range)    Or  acetaminophen (TYLENOL) suppository 650 mg (has no administration in time range)  HYDROcodone-acetaminophen (NORCO/VICODIN) 5-325 MG per tablet 1-2 tablet (has no administration in time range)  senna-docusate (Senokot-S) tablet 1 tablet (has no administration in time range)  bisacodyl (DULCOLAX) EC tablet 5 mg (has no administration in time  range)  insulin aspart (novoLOG) injection 0-15 Units (has no administration in time range)  pantoprazole (PROTONIX) 80 mg in sodium chloride 0.9 % 100 mL IVPB (0 mg Intravenous Stopped 11/20/18 1157)     Initial Impression / Assessment and Plan / ED Course  I have reviewed the triage vital signs and the nursing notes.  Pertinent labs & imaging results that were available during my care of the patient were reviewed by me and considered in my medical decision making (see chart for details).    Pt is symptomatically anemic, so I ordered 2 units prbcs for transfusion.  Stool is guaiac +, so I ordered protonix.  Pt has seen Dr. Michail Sermon from [facility], so I spoke with Dr. Therisa Doyne who will see pt in consult.  Pt d/w Dr. Reesa Chew (triad) who will admit.  CRITICAL CARE Performed by: Isla Pence   Total critical care time: 30 minutes  Critical care time was exclusive of separately billable procedures and treating other patients.  Critical care was necessary to treat or prevent imminent or life-threatening deterioration.  Critical care was time spent personally by me on the following activities: development of treatment plan with patient and/or surrogate as well as nursing, discussions with consultants, evaluation of patient's response to treatment, examination of patient, obtaining history from patient or surrogate, ordering and performing treatments and interventions, ordering and review of laboratory studies, ordering and review of radiographic studies, pulse oximetry and re-evaluation of patient's condition.  Final Clinical Impressions(s) / ED Diagnoses   Final diagnoses:  Symptomatic anemia  Gastrointestinal hemorrhage, unspecified gastrointestinal hemorrhage type    ED Discharge Orders    None       Isla Pence, MD 11/20/18 1306

## 2018-11-20 NOTE — Progress Notes (Addendum)
[facility] NOTE - Initial Consult  Pharmacy Consult for heparin Indication: AFib + mechanical AVR  Allergies  Allergen Reactions  . Flagyl [Metronidazole] Rash    ALL-OVER BODY RASH  . Coreg [Carvedilol] Other (See Comments)    Terrible cramping in the feet and had a lot of bowel movements, but not diarrhea  . Losartan Swelling    Patient doesn't recall site of swelling  . Verapamil Hives  . Zetia [Ezetimibe] Other (See Comments)    Reaction not recalled  . Zocor [Simvastatin - High Dose] Other (See Comments)    Reaction not recalled    Patient Measurements: Weight: 131 lb (59.4 kg) Heparin Dosing Weight: 59kg  Vital Signs: Temp: 97.5 F (36.4 C) (02/12 1156) Temp Source: Oral (02/12 1149) BP: 154/56 (02/12 1245) Pulse Rate: 54 (02/12 1245)  Labs: Recent Labs    11/20/18 0950 11/20/18 1021  HGB 7.2*  --   HCT 23.0*  --   PLT 206  --   LABPROT  --  22.8*  INR  --  2.04  CREATININE 1.24*  --     Estimated Creatinine Clearance: 30.4 mL/min (A) (by C-G formula based on SCr of 1.24 mg/dL (H)).   Medical History: Past Medical History:  Diagnosis Date  . Aortic atherosclerosis ([place]) 01/23/2018  . Atrial fibrillation ([place])   . Carotid artery disease ([place])   . Cholelithiasis 01/23/2018  . Chronic anticoagulation   . Coronary artery disease    status post coronary artery bypass grafting times 07/10/2004  . Diabetes mellitus   . Hypercholesteremia   . Hypertension   . Mechanical heart valve present    H. aortic valve replacement at the time of bypass surgery October 2005  . Moderate to severe pulmonary hypertension ([place])   . Peripheral arterial disease (H[facility])    history of left common iliac artery PTA and stenting for a chronic total occlusion 08/26/01    Assessment: 77 yoF on warfarin PTA for AFib and mechanical AVR with last dose this morning admitted with anemia. INR 2.04 on admit (goal 2-3 per outpatient notes). Hgb 7.2, FOBT positive, GI  planning EGD tomorrow.  Pharmacy initially consulted to start heparin infusion and stop at midnight in anticipation of GI procedures tomorrow. However, after chart review, patient's INR goal was reduced in June 2019 by cardiology to 2-3 (from usual 2.5-3.5) due to GI bleeding hx and anemia. Last Coumadin Clinic notes on 11/01/18 also utilize INR goal of 2-3. Therefore, will hold off on heparin for now as pt is at their patient-specific goal, and follow-up restarting anticoagulation tomorrow after GI procedures. Discussed with Dr. Reesa Chew.  Goal of Therapy:  Heparin level 0.3-0.5 units/ml  INR 2-3 Monitor platelets by anticoagulation protocol: Yes   Plan:  -Hold warfarin and heparin for now -Pharmacy will follow-up anticoagulation plan tomorrow  Arrie Senate, PharmD, BCPS Clinical Pharmacist [phone] Please check AMION for all [facility] numbers 11/20/2018

## 2018-11-20 NOTE — H&P (Signed)
History and Physical    [name] ASN:[id number] DOB: 06-20-1941 DOA: 11/20/2018  PCP: [name], MD Patient coming from: Home  Chief Complaint: Low hemoglobin  HPI: [name] is a 78 y.o. female with medical history significant of with history of CAD status post CABG, mechanical aortic valve replacement, grade 3 diastolic heart failure, diabetes mellitus type 2, essential hypertension, hyperlipidemia was sent to the hospital for evaluation of low hemoglobin.  Patient was admitted here last year for similar reason and had colonoscopy and endoscopy done at that time showed diverticulosis and hemorrhoids.  Since then she has been getting periodic outpatient blood work.  Over the last 2 months her primary care provider has noted hemoglobin has drifted down from 10.0-8.02 today showing about 7.8.  She was asked to come to the ER for evaluation.  Upon further questioning patient reports of progressively feeling slight fatigue.  Admits of dark stools secondary to iron but no gross blood noted.  Denies any hemoptysis either.  Denies taking any NSAIDs and unintentional weight loss.  In the ER she was noted to hemoglobin of 7.2, INR 2.04.  Patient seen by gastroenterology with plans for endoscopic evaluation tomorrow.  2 units of PRBC has been ordered.  Medical team requested to admit the patient.   Review of Systems: As per HPI otherwise 10 point review of systems negative.  Review of Systems Otherwise negative except as per HPI, including: General: Denies fever, chills, night sweats or unintended weight loss. Resp: Denies cough, wheezing, shortness of breath. Cardiac: Denies chest pain, palpitations, orthopnea, paroxysmal nocturnal dyspnea. GI: Denies abdominal pain, nausea, vomiting, diarrhea or constipation GU: Denies dysuria, frequency, hesitancy or incontinence MS: Denies muscle aches, joint pain or swelling Neuro: Denies headache, neurologic deficits (focal weakness, numbness,  tingling), abnormal gait Psych: Denies anxiety, depression, SI/HI/AVH Skin: Denies new rashes or lesions ID: Denies sick contacts, exotic exposures, travel  Past Medical History:  Diagnosis Date  . Aortic atherosclerosis ([place]) 01/23/2018  . Atrial fibrillation ([place])   . Carotid artery disease ([place])   . Cholelithiasis 01/23/2018  . Chronic anticoagulation   . Coronary artery disease    status post coronary artery bypass grafting times 07/10/2004  . Diabetes mellitus   . Hypercholesteremia   . Hypertension   . Mechanical heart valve present    H. aortic valve replacement at the time of bypass surgery October 2005  . Moderate to severe pulmonary hypertension ([place])   . Peripheral arterial disease ([facility])    history of left common iliac artery PTA and stenting for a chronic total occlusion 08/26/01    Past Surgical History:  Procedure Laterality Date  . AORTIC VALVE REPLACEMENT    . CARDIAC CATHETERIZATION  11/10/2004   40% right common illiac, 70% in stent restenosis of distal left common illiac,   . CARDIAC CATHETERIZATION  05/18/2004   LAD 50-70% midstenosis, RCA dominant w/50% stenosis, 50% Right common Illiac artery ostial stenosis, 90% in stent restenosis within midportion of left common illiac stent  . Carotid Duplex  03/12/2012   RSA-elev. velocities suggestive of a 50-69% diameter reduction, Right&Left Bulb/Prox ICA-mild-mod.fibrous plaqueelevating Velocities abnormal study.  . CHOLECYSTECTOMY N/A 03/01/2018   Procedure: LAPAROSCOPIC CHOLECYSTECTOMY;  Surgeon: Judeth Horn, MD;  Location: [facility];  Service: General;  Laterality: N/A;  . COLONOSCOPY WITH PROPOFOL N/A 01/22/2018   Procedure: COLONOSCOPY WITH PROPOFOL;  Surgeon: Wilford Corner, MD;  Location: [facility];  Service: Endoscopy;  Laterality: N/A;  . ESOPHAGOGASTRODUODENOSCOPY (EGD) WITH PROPOFOL  N/A 01/22/2018   Procedure: ESOPHAGOGASTRODUODENOSCOPY (EGD) WITH PROPOFOL;  Surgeon: Wilford Corner, MD;  Location: [facility];  Service: Endoscopy;  Laterality: N/A;  . Lower Ext. Duplex  03/12/2012   Right Proximal CIA- vessel narrowing w/elevated velocities 0-49% diameter reduction. Right SFA-mild mixed density plaque throughout vessel.  [name] NM MYOCAR PERF WALL MOTION  05/19/2010   protocol: Persantine, post stress EF 65%, negative for ischemia, low risk scan  . RIGHT HEART CATH N/A 06/27/2018   Procedure: RIGHT HEART CATH;  Surgeon: [name], MD;  Location: [facility];  Service: Cardiovascular;  Laterality: N/A;  . TRANSTHORACIC ECHOCARDIOGRAM  08/29/2012   Moderately calcified annulus of mitral valve, moderate regurg. of both mitral valve and tricuspid valve.     SOCIAL HISTORY:  reports that she has quit smoking. Her smoking use included cigarettes. She has a 30.00 pack-year smoking history. She has never used smokeless tobacco. She reports that she does not drink alcohol or use drugs.  Allergies  Allergen Reactions  . Flagyl [Metronidazole] Rash    ALL-OVER BODY RASH  . Coreg [Carvedilol] Other (See Comments)    Terrible cramping in the feet and had a lot of bowel movements, but not diarrhea  . Losartan Swelling    Patient doesn't recall site of swelling  . Verapamil Hives  . Zetia [Ezetimibe] Other (See Comments)    Reaction not recalled  . Zocor [Simvastatin - High Dose] Other (See Comments)    Reaction not recalled    FAMILY HISTORY: Family History  Problem Relation Age of Onset  . Breast cancer Neg Hx      Prior to Admission medications   Medication Sig Start Date End Date Taking? Authorizing Provider  acetaminophen (TYLENOL) 325 MG tablet Take 2 tablets (650 mg total) by mouth every 6 (six) hours as needed for mild pain, fever or headache (for pain or headaches). Patient taking differently: Take 500 mg by mouth every 6 (six) hours as needed for mild pain, fever or headache (for pain or headaches).  03/03/18  Yes Rayburn, Floyce Stakes, PA-C  ALPRAZolam (XANAX) 0.5 MG tablet  Take 0.25 mg by mouth at bedtime.    Yes [provider]  atorvastatin (LIPITOR) 80 MG tablet Take 1 tablet (80 mg total) by mouth daily. Patient taking differently: Take 80 mg by mouth daily at 6 PM.  09/25/17  Yes Lorretta Harp, MD  Cholecalciferol (VITAMIN D3) 1000 UNITS CAPS Take 1,000 Units by mouth daily. 12 noon   Yes [provider]  COLCRYS 0.6 MG tablet Take 0.6 mg by mouth daily as needed (gout flare).  03/22/18  Yes [provider]  ferrous sulfate 325 (65 FE) MG tablet Take 1 tablet (325 mg total) by mouth 2 (two) times daily with a meal. Patient taking differently: Take 325 mg by mouth 2 (two) times daily with a meal. Take at lunch and supper time 03/05/18  Yes Emokpae, Courage, MD  furosemide (LASIX) 40 MG tablet Take 80 mg by mouth daily.   Yes [provider]  hydrALAZINE (APRESOLINE) 100 MG tablet Take 1 tablet (100 mg total) by mouth 3 (three) times daily. 08/22/18  Yes [name], MD  irbesartan (AVAPRO) 300 MG tablet TAKE 1 TABLET DAILY (PLEASE MAKE APPOINTMENT FOR REFILLS) Patient taking differently: Take 300 mg by mouth daily.  08/06/18  Yes Lorretta Harp, MD  magnesium oxide (MAG-OX) 400 (241.3 Mg) MG tablet Take 1 tablet (400 mg total) by mouth daily.  Patient taking differently: Take 400 mg by mouth daily. Take at noon 01/30/18  Yes [name], MD  metFORMIN (GLUCOPHAGE) 500 MG tablet Take 500 mg by mouth 2 (two) times daily with a meal.    Yes [provider]  metoprolol tartrate (LOPRESSOR) 25 MG tablet TAKE ONE-HALF TABLET BY MOUTH TWICE A DAY Patient taking differently: Take 12.5 mg by mouth 2 (two) times daily.  09/16/18  Yes [name], MD  ondansetron (ZOFRAN) 4 MG tablet Take 4 mg by mouth every 8 (eight) hours as needed for nausea or vomiting.  03/22/18  Yes [provider]  pantoprazole (PROTONIX) 40 MG tablet Take 40 mg by mouth 2 (two) times daily.  12/05/14  Yes [provider]  Polyethyl Glyc-Propyl Glyc PF (SYSTANE PRESERVATIVE FREE) 0.4-0.3 % SOLN Place 1-2 drops into both eyes 3 (three) times daily as needed (for irritation).    Yes [provider]  spironolactone (ALDACTONE) 25 MG tablet Take 0.5 tablets (12.5 mg total) by mouth daily. 09/24/18 12/23/18 Yes [name], MD  tadalafil (ADCIRCA/CIALIS) 20 MG tablet TAKE ONE TABLET BY MOUTH DAILY Patient taking differently: Take 20 mg by mouth 2 (two) times daily.  11/08/18  Yes [name], MD  vitamin B-12 (CYANOCOBALAMIN) 100 MCG tablet Take 100 mcg by mouth daily at 12 noon.    Yes [provider]  warfarin (COUMADIN) 5 MG tablet TAKE 1.5 TO 2 TABLETS BY MOUTH DAILY AS DIRECTED BY COUMADIN CLINIC Patient taking differently: Take 5-7.5 mg by mouth See admin instructions. Take 5 mg on Monday and Friday take in the morning [place] 7.5 mg all the other days 11/11/18  Yes Lorretta Harp, MD  tadalafil, PAH, (ADCIRCA) 20 MG tablet Take 2 tablets (40 mg total) by mouth daily. Patient not taking: Reported on 11/20/2018 10/23/18   [name], MD    Physical Exam: Vitals:   11/20/18 1200 11/20/18 1215 11/20/18 1230 11/20/18 1245  BP: (!) 160/46 (!) 150/51 (!) 182/50 (!) 154/56  Pulse: (!) 53 (!) 53 (!) 54 (!) 54  Resp: 17 11 19 16   Temp:      TempSrc:      SpO2: 98% 98% 97% 98%      Constitutional: NAD, calm, comfortable, elderly frail-appearing Eyes: PERRL, lids and conjunctivae slightly pale ENMT: Mucous membranes are moist. Posterior pharynx clear of any exudate or lesions.Normal dentition.  Neck: normal, supple, no masses, no thyromegaly Respiratory: clear to auscultation bilaterally, no wheezing, no crackles. Normal respiratory effort. No accessory muscle use.  Cardiovascular: Regular rate and rhythm, +systolic murmurs no/ rubs / gallops. No extremity edema. 2+ pedal pulses. No carotid bruits.  Abdomen: no tenderness, no masses palpated. No hepatosplenomegaly. Bowel sounds  positive.  Musculoskeletal: no clubbing / cyanosis. No joint deformity upper and lower extremities. Good ROM, no contractures. Normal muscle tone.  Skin: no rashes, lesions, ulcers. No induration Neurologic: CN 2-12 grossly intact. Sensation intact, DTR normal. Strength 4/5 in all 4.  Psychiatric: Normal judgment and insight. Alert and oriented x 3. Normal mood.     Labs on Admission: I have personally reviewed following labs and imaging studies  CBC: Recent Labs  Lab 11/20/18 0950  WBC 4.3  HGB 7.2*  HCT 23.0*  MCV 92.4  PLT 354   Basic Metabolic Panel: Recent Labs  Lab 11/20/18 0950  NA 128*  K 4.9  CL 96*  CO2 19*  GLUCOSE 234*  BUN 39*  CREATININE 1.24*  CALCIUM 9.1   GFR: CrCl cannot be calculated (Unknown ideal weight.). Liver Function Tests: Recent Labs  Lab 11/20/18 0950  AST 29  ALT 19  ALKPHOS 69  BILITOT 0.7  PROT 6.6  ALBUMIN 3.3*   No results for input(s): LIPASE, AMYLASE in the last 168 hours. No results for input(s): AMMONIA in the last 168 hours. Coagulation Profile: Recent Labs  Lab 11/20/18 1021  INR 2.04   Cardiac Enzymes: No results for input(s): CKTOTAL, CKMB, CKMBINDEX, TROPONINI in the last 168 hours. BNP (last 3 results) No results for input(s): PROBNP in the last 8760 hours. HbA1C: No results for input(s): HGBA1C in the last 72 hours. CBG: No results for input(s): GLUCAP in the last 168 hours. Lipid Profile: No results for input(s): CHOL, HDL, LDLCALC, TRIG, CHOLHDL, LDLDIRECT in the last 72 hours. Thyroid Function Tests: No results for input(s): TSH, T4TOTAL, FREET4, T3FREE, THYROIDAB in the last 72 hours. Anemia Panel: No results for input(s): VITAMINB12, FOLATE, FERRITIN, TIBC, IRON, RETICCTPCT in the last 72 hours. Urine analysis:    Component Value Date/Time   COLORURINE YELLOW 02/24/2018 [place] 02/24/2018 1621   LABSPEC <1.005 (L) 02/24/2018 1621   PHURINE 5.5 02/24/2018 1621   GLUCOSEU  NEGATIVE 02/24/2018 1621   HGBUR SMALL (A) 02/24/2018 1621   BILIRUBINUR NEGATIVE 02/24/2018 1621   KETONESUR NEGATIVE 02/24/2018 1621   PROTEINUR NEGATIVE 02/24/2018 1621   UROBILINOGEN 1.0 10/06/2011 1709   NITRITE NEGATIVE 02/24/2018 1621   LEUKOCYTESUR NEGATIVE 02/24/2018 1621   Sepsis Labs: !!!!!!!!!!!!!!!!!!!!!!!!!!!!!!!!!!!!!!!!!!!! @LABRCNTIP (procalcitonin:4,lacticidven:4) )No results found for this or any previous visit (from the past 240 hour(s)).   Radiological Exams on Admission: Dg Chest Portable 1 View  Result Date: 11/20/2018 CLINICAL DATA:  Anemia. EXAM: PORTABLE CHEST 1 VIEW COMPARISON:  07/11/2018 FINDINGS: The heart is enlarged but stable. There is tortuosity and calcification of the thoracic aorta. Stable surgical changes from coronary artery bypass surgery. No acute pulmonary findings are identified. No pleural effusions. Stable calcified granulomas. IMPRESSION: Cardiac enlargement but no acute pulmonary findings. Electronically Signed   By: Marijo Sanes M.D.   On: 11/20/2018 11:25     All images have been reviewed by me personally.    Assessment/Plan Principal Problem:   GI bleed Active Problems:   Hypertension   Hypercholesteremia   Mechanical heart valve present   Chronic anticoagulation   Hyponatremia   Coronary artery disease   Carotid artery disease ([facility])   Peripheral arterial disease ([facility])   Diabetes mellitus without complication ([facility])   Atrial fibrillation ([facility])   Symptomatic anemia   GERD (gastroesophageal reflux disease)   Chronic diastolic CHF (congestive heart failure) ([facility])   Chronic atrial fibrillation   H/O mechanical aortic valve replacement   Moderate to severe pulmonary hypertension ([facility])    Symptomatic anemia, hemoglobin 7.2 GI bleed, possible upper GI bleed/small bowel - Admit the patient to the hospital.  2 units of PRBC transfusion ordered.  IV PPI started.  Iron studies ordered.  Clear liquid diet today, n.p.o. past  midnight. -Provide supportive care.  Closely monitor hemoglobin. -Depending on whether endoscopic findings show, patient may need PillCam or further evaluation by chest CT - Okay for heparin drip at this time per GI, will stop at around midnight. -Continue iron supplements, bowel regimen PRN  Coronary artery disease/carotid artery disease/peripheral arterial disease status post CABG History of mechanical aortic valve replacement Chronic atrial fibrillation -Currently patient is chest pain-free.  We will plan on continuing heparin drip, statin, beta-blocker,  Aldactone.  Continue hydralazine, daily Lasix.  P.o. Avapro can continue as well once blood pressures remained stable.  IV hydralazine PRN -Continue provide supportive care - Resume Coumadin once cleared by gastroenterology, goal INR 2.5-3.5.  Chronic diastolic congestive heart failure, grade 3 -Currently appears to be euvolemic.  Plan on resuming home medications.  We will slowly resume Avapro as well.  Essential hypertension -Home medications as stated above.  IV hydralazine PRN  Moderate to severe pulmonary hypertension -Continue tadalafil 20 mg twice daily.  Diabetes mellitus type 2 -Insulin sliding scale and Accu-Cheks.  Chronic hyponatremia -Sodium around baseline of 130.  Today's 128.  Hyperlipidemia -Continue statin  GERD -On PPI  DVT prophylaxis: Heparin drip, okay per GI. Code Status: Full code Family Communication: Son at bedside Disposition Plan: To be determined Consults called: Gastroenterology Admission status: Medical telemetry admission   Time Spent: 65 minutes.  >50% of the time was devoted to discussing the patients care, assessment, plan and disposition with other care givers along with counseling the patient about the risks and benefits of treatment.    Ankit Arsenio Loader MD [facility]  If 7PM-7AM, please contact night-coverage [email or web address]  11/20/2018, 1:36 PM

## 2018-11-20 NOTE — ED Notes (Signed)
Attempted report x1. 

## 2018-11-20 NOTE — H&P (View-Only) (Signed)
[facility] Consult  Referring Provider: Isla Pence, MD Primary Care Physician:  [name], MD Primary Gastroenterologist: Dr.Brahmbhatt/[facility]  Reason for Consultation:  Anemia  HPI: [name] is a 78 y.o. female was sent to the ER after outpatient labs showed a hemoglobin of 7.8. Patient is on Coumadin for mechanical aortic valve and atrial fibrillation, and has been taking iron orally twice a day. She reports that her stools have been dark since using iron but denies black stools or bloody bowel movements. For the last 3 weeks she has been bringing up "phlegm",which is brown in color, denies shortness of breath or chest pain associated with it. Since Christmas last year, she has experienced difficulty swallowing meats, this is not associated with loss of weight and is not progressive. Patient has had an EGD and a colonoscopy on 01/22/18 which were unremarkable and showed diverticulosis and hemorrhoids. Patient complains of 4-5 loose dark bowel movements, reports that her diarrhea started since cholecystectomy performed in May 2019 and has worsened in the last 4 days since starting colchicine for right ankle gout.  She denies epistaxis, bruising, hematuria or bleeding from any other orifice.  Past Medical History:  Diagnosis Date  . Aortic atherosclerosis ([place]) 01/23/2018  . Atrial fibrillation ([place])   . Carotid artery disease ([place])   . Cholelithiasis 01/23/2018  . Chronic anticoagulation   . Coronary artery disease    status post coronary artery bypass grafting times 07/10/2004  . Diabetes mellitus   . Hypercholesteremia   . Hypertension   . Mechanical heart valve present    H. aortic valve replacement at the time of bypass surgery October 2005  . Moderate to severe pulmonary hypertension ([place])   . Peripheral arterial disease ([facility])    history of left common iliac artery PTA and stenting for a chronic total occlusion 08/26/01    Past Surgical  History:  Procedure Laterality Date  . AORTIC VALVE REPLACEMENT    . CARDIAC CATHETERIZATION  11/10/2004   40% right common illiac, 70% in stent restenosis of distal left common illiac,   . CARDIAC CATHETERIZATION  05/18/2004   LAD 50-70% midstenosis, RCA dominant w/50% stenosis, 50% Right common Illiac artery ostial stenosis, 90% in stent restenosis within midportion of left common illiac stent  . Carotid Duplex  03/12/2012   RSA-elev. velocities suggestive of a 50-69% diameter reduction, Right&Left Bulb/Prox ICA-mild-mod.fibrous plaqueelevating Velocities abnormal study.  . CHOLECYSTECTOMY N/A 03/01/2018   Procedure: LAPAROSCOPIC CHOLECYSTECTOMY;  Surgeon: Judeth Horn, MD;  Location: [facility];  Service: General;  Laterality: N/A;  . COLONOSCOPY WITH PROPOFOL N/A 01/22/2018   Procedure: COLONOSCOPY WITH PROPOFOL;  Surgeon: Wilford Corner, MD;  Location: [facility];  Service: Endoscopy;  Laterality: N/A;  . ESOPHAGOGASTRODUODENOSCOPY (EGD) WITH PROPOFOL N/A 01/22/2018   Procedure: ESOPHAGOGASTRODUODENOSCOPY (EGD) WITH PROPOFOL;  Surgeon: Wilford Corner, MD;  Location: [facility];  Service: Endoscopy;  Laterality: N/A;  . Lower Ext. Duplex  03/12/2012   Right Proximal CIA- vessel narrowing w/elevated velocities 0-49% diameter reduction. Right SFA-mild mixed density plaque throughout vessel.  [name] NM MYOCAR PERF WALL MOTION  05/19/2010   protocol: Persantine, post stress EF 65%, negative for ischemia, low risk scan  . RIGHT HEART CATH N/A 06/27/2018   Procedure: RIGHT HEART CATH;  Surgeon: [name], MD;  Location: [facility];  Service: Cardiovascular;  Laterality: N/A;  . TRANSTHORACIC ECHOCARDIOGRAM  08/29/2012   Moderately calcified annulus of mitral valve, moderate regurg. of both mitral valve and tricuspid valve.  Prior to Admission medications   Medication Sig Start Date End Date Taking? Authorizing Provider  acetaminophen (TYLENOL) 325 MG tablet Take 2 tablets (650  mg total) by mouth every 6 (six) hours as needed for mild pain, fever or headache (for pain or headaches). Patient taking differently: Take 500 mg by mouth every 6 (six) hours as needed for mild pain, fever or headache (for pain or headaches).  03/03/18  Yes Rayburn, Floyce Stakes, PA-C  ALPRAZolam (XANAX) 0.5 MG tablet Take 0.25 mg by mouth at bedtime.    Yes [provider]  atorvastatin (LIPITOR) 80 MG tablet Take 1 tablet (80 mg total) by mouth daily. Patient taking differently: Take 80 mg by mouth daily at 6 PM.  09/25/17  Yes Lorretta Harp, MD  Cholecalciferol (VITAMIN D3) 1000 UNITS CAPS Take 1,000 Units by mouth daily. 12 noon   Yes [provider]  COLCRYS 0.6 MG tablet Take 0.6 mg by mouth daily as needed (gout flare).  03/22/18  Yes [provider]  ferrous sulfate 325 (65 FE) MG tablet Take 1 tablet (325 mg total) by mouth 2 (two) times daily with a meal. Patient taking differently: Take 325 mg by mouth 2 (two) times daily with a meal. Take at lunch and supper time 03/05/18  Yes Emokpae, Courage, MD  furosemide (LASIX) 40 MG tablet Take 80 mg by mouth daily.   Yes [provider]  hydrALAZINE (APRESOLINE) 100 MG tablet Take 1 tablet (100 mg total) by mouth 3 (three) times daily. 08/22/18  Yes [name], MD  irbesartan (AVAPRO) 300 MG tablet TAKE 1 TABLET DAILY (PLEASE MAKE APPOINTMENT FOR REFILLS) Patient taking differently: Take 300 mg by mouth daily.  08/06/18  Yes Lorretta Harp, MD  magnesium oxide (MAG-OX) 400 (241.3 Mg) MG tablet Take 1 tablet (400 mg total) by mouth daily. Patient taking differently: Take 400 mg by mouth daily. Take at noon 01/30/18  Yes [name], MD  metFORMIN (GLUCOPHAGE) 500 MG tablet Take 500 mg by mouth 2 (two) times daily with a meal.    Yes [provider]  metoprolol tartrate (LOPRESSOR) 25 MG tablet TAKE ONE-HALF TABLET BY MOUTH TWICE A DAY Patient taking differently: Take 12.5 mg by mouth 2  (two) times daily.  09/16/18  Yes [name], MD  ondansetron (ZOFRAN) 4 MG tablet Take 4 mg by mouth every 8 (eight) hours as needed for nausea or vomiting.  03/22/18  Yes [provider]  pantoprazole (PROTONIX) 40 MG tablet Take 40 mg by mouth 2 (two) times daily.  12/05/14  Yes [provider]  Polyethyl Glyc-Propyl Glyc PF (SYSTANE PRESERVATIVE FREE) 0.4-0.3 % SOLN Place 1-2 drops into both eyes 3 (three) times daily as needed (for irritation).    Yes [provider]  spironolactone (ALDACTONE) 25 MG tablet Take 0.5 tablets (12.5 mg total) by mouth daily. 09/24/18 12/23/18 Yes [name], MD  tadalafil (ADCIRCA/CIALIS) 20 MG tablet TAKE ONE TABLET BY MOUTH DAILY Patient taking differently: Take 20 mg by mouth 2 (two) times daily.  11/08/18  Yes [name], MD  vitamin B-12 (CYANOCOBALAMIN) 100 MCG tablet Take 100 mcg by mouth daily at 12 noon.    Yes [provider]  warfarin (COUMADIN) 5 MG tablet TAKE 1.5 TO 2 TABLETS BY MOUTH DAILY AS DIRECTED BY COUMADIN CLINIC Patient taking differently: Take 5-7.5 mg by mouth See admin instructions. Take 5 mg on Monday and Friday take in the morning Tale  7.5 mg all the other days 11/11/18  Yes Lorretta Harp, MD  tadalafil, PAH, (ADCIRCA) 20 MG tablet Take 2 tablets (40 mg total) by mouth daily. Patient not taking: Reported on 11/20/2018 10/23/18   [name], MD    Current Facility-Administered Medications  Medication Dose Route Frequency Provider Last Rate Last Dose  . 0.9 %  sodium chloride infusion (Manually program via Guardrails IV Fluids)   Intravenous Once Isla Pence, MD      . 0.9 %  sodium chloride infusion   Intravenous Continuous Amin, Ankit Chirag, MD      . acetaminophen (TYLENOL) tablet 650 mg  650 mg Oral Q6H PRN Amin, Ankit Chirag, MD       Or  . acetaminophen (TYLENOL) suppository 650 mg  650 mg Rectal Q6H PRN Amin, Ankit Chirag, MD      . ALPRAZolam Duanne Moron) tablet 0.25 mg   0.25 mg Oral QHS Amin, Ankit Chirag, MD      . atorvastatin (LIPITOR) tablet 80 mg  80 mg Oral Daily Amin, Ankit Chirag, MD      . bisacodyl (DULCOLAX) EC tablet 5 mg  5 mg Oral Daily PRN Amin, Ankit Chirag, MD      . ferrous sulfate tablet 325 mg  325 mg Oral BID WC Amin, Ankit Chirag, MD      . Derrill Memo ON 11/21/2018] furosemide (LASIX) tablet 80 mg  80 mg Oral Daily Amin, Ankit Chirag, MD      . hydrALAZINE (APRESOLINE) tablet 100 mg  100 mg Oral TID Amin, Ankit Chirag, MD      . HYDROcodone-acetaminophen (NORCO/VICODIN) 5-325 MG per tablet 1-2 tablet  1-2 tablet Oral Q4H PRN Amin, Ankit Chirag, MD      . insulin aspart (novoLOG) injection 0-15 Units  0-15 Units Subcutaneous TID WC Amin, Ankit Chirag, MD      . metoprolol tartrate (LOPRESSOR) tablet 12.5 mg  12.5 mg Oral BID Amin, Ankit Chirag, MD      . pantoprazole (PROTONIX) 80 mg in sodium chloride 0.9 % 250 mL (0.32 mg/mL) infusion  8 mg/hr Intravenous Continuous Isla Pence, MD 25 mL/hr at 11/20/18 1254 8 mg/hr at 11/20/18 1254  . [START ON 11/23/2018] pantoprazole (PROTONIX) injection 40 mg  40 mg Intravenous Q12H Isla Pence, MD      . senna-docusate (Senokot-S) tablet 1 tablet  1 tablet Oral QHS PRN Amin, Ankit Chirag, MD      . spironolactone (ALDACTONE) tablet 12.5 mg  12.5 mg Oral Daily Amin, Ankit Chirag, MD      . tadalafil (ADCIRCA/CIALIS) tablet 20 mg  20 mg Oral BID Amin, Ankit Chirag, MD      . vitamin B-12 (CYANOCOBALAMIN) tablet 100 mcg  100 mcg Oral Q1200 Amin, Jeanella Flattery, MD       Current Outpatient Medications  Medication Sig Dispense Refill  . acetaminophen (TYLENOL) 325 MG tablet Take 2 tablets (650 mg total) by mouth every 6 (six) hours as needed for mild pain, fever or headache (for pain or headaches). (Patient taking differently: Take 500 mg by mouth every 6 (six) hours as needed for mild pain, fever or headache (for pain or headaches). )    . ALPRAZolam (XANAX) 0.5 MG tablet Take 0.25 mg by mouth at bedtime.      [name] atorvastatin (LIPITOR) 80 MG tablet Take 1 tablet (80 mg total) by mouth daily. (Patient taking differently: Take 80 mg by mouth daily at 6 PM. ) 30 tablet 0  .  Cholecalciferol (VITAMIN D3) 1000 UNITS CAPS Take 1,000 Units by mouth daily. 12 noon    . COLCRYS 0.6 MG tablet Take 0.6 mg by mouth daily as needed (gout flare).     . ferrous sulfate 325 (65 FE) MG tablet Take 1 tablet (325 mg total) by mouth 2 (two) times daily with a meal. (Patient taking differently: Take 325 mg by mouth 2 (two) times daily with a meal. Take at lunch and supper time) 60 tablet 3  . furosemide (LASIX) 40 MG tablet Take 80 mg by mouth daily.    . hydrALAZINE (APRESOLINE) 100 MG tablet Take 1 tablet (100 mg total) by mouth 3 (three) times daily. 90 tablet 5  . irbesartan (AVAPRO) 300 MG tablet TAKE 1 TABLET DAILY (PLEASE MAKE APPOINTMENT FOR REFILLS) (Patient taking differently: Take 300 mg by mouth daily. ) 90 tablet 3  . magnesium oxide (MAG-OX) 400 (241.3 Mg) MG tablet Take 1 tablet (400 mg total) by mouth daily. (Patient taking differently: Take 400 mg by mouth daily. Take at noon)    . metFORMIN (GLUCOPHAGE) 500 MG tablet Take 500 mg by mouth 2 (two) times daily with a meal.     . metoprolol tartrate (LOPRESSOR) 25 MG tablet TAKE ONE-HALF TABLET BY MOUTH TWICE A DAY (Patient taking differently: Take 12.5 mg by mouth 2 (two) times daily. ) 90 tablet 2  . ondansetron (ZOFRAN) 4 MG tablet Take 4 mg by mouth every 8 (eight) hours as needed for nausea or vomiting.     . pantoprazole (PROTONIX) 40 MG tablet Take 40 mg by mouth 2 (two) times daily.     Vladimir Faster Glyc-Propyl Glyc PF (SYSTANE PRESERVATIVE FREE) 0.4-0.3 % SOLN Place 1-2 drops into both eyes 3 (three) times daily as needed (for irritation).     [name] spironolactone (ALDACTONE) 25 MG tablet Take 0.5 tablets (12.5 mg total) by mouth daily. 15 tablet 5  . tadalafil (ADCIRCA/CIALIS) 20 MG tablet TAKE ONE TABLET BY MOUTH DAILY (Patient taking differently: Take 20 mg  by mouth 2 (two) times daily. ) 30 tablet 0  . vitamin B-12 (CYANOCOBALAMIN) 100 MCG tablet Take 100 mcg by mouth daily at 12 noon.     . warfarin (COUMADIN) 5 MG tablet TAKE 1.5 TO 2 TABLETS BY MOUTH DAILY AS DIRECTED BY COUMADIN CLINIC (Patient taking differently: Take 5-7.5 mg by mouth See admin instructions. Take 5 mg on Monday and Friday take in the morning Tale 7.5 mg all the other days) 135 tablet 0  . tadalafil, PAH, (ADCIRCA) 20 MG tablet Take 2 tablets (40 mg total) by mouth daily. (Patient not taking: Reported on 11/20/2018) 60 tablet 3    Allergies as of 11/20/2018 - Review Complete 11/20/2018  Allergen Reaction Noted  . Flagyl [metronidazole] Rash 02/21/2018  . Coreg [carvedilol] Other (See Comments) 01/18/2018  . Losartan Swelling 08/22/2013  . Verapamil Hives 08/22/2013  . Zetia [ezetimibe] Other (See Comments) 10/06/2011  . Zocor [simvastatin - high dose] Other (See Comments) 10/06/2011    Family History  Problem Relation Age of Onset  . Breast cancer Neg Hx     Social History   Socioeconomic History  . Marital status: Widowed    Spouse name: Not on file  . Number of children: Not on file  . Years of education: Not on file  . Highest education level: Not on file  Occupational History  . Occupation: Retired  Scientific laboratory technician  . Financial resource strain: Not on file  . Food insecurity:  Worry: Not on file    Inability: Not on file  . Transportation needs:    Medical: Not on file    Non-medical: Not on file  Tobacco Use  . Smoking status: Former Smoker    Packs/day: 1.00    Years: 30.00    Pack years: 30.00    Types: Cigarettes  . Smokeless tobacco: Never Used  Substance and Sexual Activity  . Alcohol use: No    Alcohol/week: 0.0 standard drinks  . Drug use: No  . Sexual activity: Not on file  Lifestyle  . Physical activity:    Days per week: Not on file    Minutes per session: Not on file  . Stress: Not on file  Relationships  . Social  connections:    Talks on phone: Not on file    Gets together: Not on file    Attends religious service: Not on file    Active member of club or organization: Not on file    Attends meetings of clubs or organizations: Not on file    Relationship status: Not on file  . Intimate partner violence:    Fear of current or ex partner: Not on file    Emotionally abused: Not on file    Physically abused: Not on file    Forced sexual activity: Not on file  Other Topics Concern  . Not on file  Social History Narrative  . Not on file    Review of Systems: Positive for: GI: Described in detail in HPI.    Gen: fatigue, weakness, malaise,Denies any fever, chills, rigors, night sweats, anorexia,  involuntary weight loss, and sleep disorder CV: Denies chest pain, angina, palpitations, syncope, orthopnea, PND, peripheral edema, and claudication. Resp: brownish sputum, Denies dyspnea, cough, wheezing, coughing up blood. GU : Denies urinary burning, blood in urine, urinary frequency, urinary hesitancy, nocturnal urination, and urinary incontinence. TT:SVXBL ankle joint pain,redness and swelling.  Denies muscle weakness, cramps, atrophy.  Derm: Denies rash, itching, oral ulcerations, hives, unhealing ulcers.  Psych: Denies depression, anxiety, memory loss, suicidal ideation, hallucinations,  and confusion. Heme: Denies bruising and enlarged lymph nodes. Neuro:  Denies any headaches, dizziness, paresthesias. Endo:  DM, Denies any problems with thyroid, adrenal function.  Physical Exam: Vital signs in last 24 hours: Temp:  [97.5 F (36.4 C)-97.9 F (36.6 C)] 97.5 F (36.4 C) (02/12 1156) Pulse Rate:  [51-61] 54 (02/12 1245) Resp:  [11-19] 16 (02/12 1245) BP: (148-182)/(46-56) 154/56 (02/12 1245) SpO2:  [97 %-99 %] 98 % (02/12 1245)    General:   Alert,  Well-developed, well-nourished, pleasant and cooperative in NAD Head:  Normocephalic and atraumatic. Eyes:  Sclera clear, no icterus.Mild  pallor. Ears:  Normal auditory acuity. Nose:  No deformity, discharge,  or lesions. Mouth:  No deformity or lesions.  Oropharynx pink & moist. Neck:  Supple; no masses or thyromegaly. Lungs:  Clear throughout to auscultation.   No wheezes, crackles, or rhonchi. No acute distress. Heart:  Midline scar, Regular rate and rhythm; sinus bradycardia,prominent click, no murmurs, rubs,  or gallops. Extremities:  Right ankle erythema and tenderness. Neurologic:  Alert and  oriented x4;  grossly normal neurologically. Skin:  Intact without significant lesions or rashes. Psych:  Alert and cooperative. Normal mood and affect. Abdomen:  Soft, nontender and nondistended. No masses, hepatosplenomegaly or hernias noted. Normal bowel sounds, without guarding, and without rebound.         Lab Results: Recent Labs    11/20/18 0950  WBC  4.3  HGB 7.2*  HCT 23.0*  PLT 206   BMET Recent Labs    11/20/18 0950  NA 128*  K 4.9  CL 96*  CO2 19*  GLUCOSE 234*  BUN 39*  CREATININE 1.24*  CALCIUM 9.1   LFT Recent Labs    11/20/18 0950  PROT 6.6  ALBUMIN 3.3*  AST 29  ALT 19  ALKPHOS 69  BILITOT 0.7   PT/INR Recent Labs    11/20/18 1021  LABPROT 22.8*  INR 2.04    Studies/Results: Dg Chest Portable 1 View  Result Date: 11/20/2018 CLINICAL DATA:  Anemia. EXAM: PORTABLE CHEST 1 VIEW COMPARISON:  07/11/2018 FINDINGS: The heart is enlarged but stable. There is tortuosity and calcification of the thoracic aorta. Stable surgical changes from coronary artery bypass surgery. No acute pulmonary findings are identified. No pleural effusions. Stable calcified granulomas. IMPRESSION: Cardiac enlargement but no acute pulmonary findings. Electronically Signed   By: Marijo Sanes M.D.   On: 11/20/2018 11:25    Impression: Symptomatic anemia On Coumadin for mechanical aortic valve and history of atrial fibrillation, last dose today morning Hemoglobin 7.2 with INR of 2.04, PT 22.8 Elevated  BUNs/creatinine ratio 39/1.24  Possible upper GI/small bowel bleeding  Multiple comorbidities as listed above  Plan: Plan EGD with possible small bowel PillCam deployment in a.m. Ideally INR needs to be less than 2, recommend holding Coumadin for now, recommend using IV heparin for mechanical aortic valve, heparin needs to be on hold at least 6 hours before the planned procedure Agree with IV Protonix Clear liquid diet and nothing by mouth post midnight Patient is receiving 1 unit PRBC transfusion currently  Chest x-ray noted to be normal, if EGD and small bowel PillCam unrevealing, recommend CT of the chest as patient complains of bringing up "brown phlegm" for the last 3 weeks.   LOS: 0 days   [name], M.D.  11/20/2018, 1:17 PM  Pager [phone] If no answer or after 5 PM call [phone]

## 2018-11-21 ENCOUNTER — Observation Stay (HOSPITAL_COMMUNITY): Payer: Medicare HMO | Admitting: Certified Registered Nurse Anesthetist

## 2018-11-21 ENCOUNTER — Encounter (HOSPITAL_COMMUNITY): Payer: Self-pay | Admitting: *Deleted

## 2018-11-21 ENCOUNTER — Encounter (HOSPITAL_COMMUNITY)
Admission: EM | Disposition: A | Discharge status: Home / Self Care | Payer: Self-pay | Source: Home / Self Care | Attending: Emergency Medicine

## 2018-11-21 DIAGNOSIS — I482 Chronic atrial fibrillation, unspecified: Secondary | ICD-10-CM

## 2018-11-21 DIAGNOSIS — Z7901 Long term (current) use of anticoagulants: Secondary | ICD-10-CM | POA: Diagnosis not present

## 2018-11-21 DIAGNOSIS — K921 Melena: Secondary | ICD-10-CM | POA: Diagnosis not present

## 2018-11-21 DIAGNOSIS — I2510 Atherosclerotic heart disease of native coronary artery without angina pectoris: Secondary | ICD-10-CM | POA: Diagnosis not present

## 2018-11-21 DIAGNOSIS — K254 Chronic or unspecified gastric ulcer with hemorrhage: Secondary | ICD-10-CM | POA: Diagnosis not present

## 2018-11-21 DIAGNOSIS — K922 Gastrointestinal hemorrhage, unspecified: Secondary | ICD-10-CM

## 2018-11-21 DIAGNOSIS — I4891 Unspecified atrial fibrillation: Secondary | ICD-10-CM | POA: Diagnosis not present

## 2018-11-21 DIAGNOSIS — E871 Hypo-osmolality and hyponatremia: Secondary | ICD-10-CM

## 2018-11-21 DIAGNOSIS — D500 Iron deficiency anemia secondary to blood loss (chronic): Secondary | ICD-10-CM | POA: Diagnosis not present

## 2018-11-21 DIAGNOSIS — I5032 Chronic diastolic (congestive) heart failure: Secondary | ICD-10-CM | POA: Diagnosis not present

## 2018-11-21 DIAGNOSIS — E119 Type 2 diabetes mellitus without complications: Secondary | ICD-10-CM | POA: Diagnosis not present

## 2018-11-21 DIAGNOSIS — K259 Gastric ulcer, unspecified as acute or chronic, without hemorrhage or perforation: Secondary | ICD-10-CM | POA: Diagnosis not present

## 2018-11-21 DIAGNOSIS — Z952 Presence of prosthetic heart valve: Secondary | ICD-10-CM

## 2018-11-21 DIAGNOSIS — I272 Pulmonary hypertension, unspecified: Secondary | ICD-10-CM | POA: Diagnosis not present

## 2018-11-21 DIAGNOSIS — E7800 Pure hypercholesterolemia, unspecified: Secondary | ICD-10-CM | POA: Diagnosis not present

## 2018-11-21 DIAGNOSIS — D509 Iron deficiency anemia, unspecified: Secondary | ICD-10-CM | POA: Diagnosis not present

## 2018-11-21 DIAGNOSIS — E785 Hyperlipidemia, unspecified: Secondary | ICD-10-CM | POA: Diagnosis not present

## 2018-11-21 DIAGNOSIS — I110 Hypertensive heart disease with heart failure: Secondary | ICD-10-CM | POA: Diagnosis not present

## 2018-11-21 HISTORY — PX: ESOPHAGOGASTRODUODENOSCOPY (EGD) WITH PROPOFOL: SHX5813

## 2018-11-21 HISTORY — PX: GIVENS CAPSULE STUDY: SHX5432

## 2018-11-21 LAB — TYPE AND SCREEN
ABO/RH(D): A POS
Antibody Screen: NEGATIVE
Unit division: 0
Unit division: 0

## 2018-11-21 LAB — COMPREHENSIVE METABOLIC PANEL
ALT: 16 U/L (ref 0–44)
AST: 30 U/L (ref 15–41)
Albumin: 2.9 g/dL — ABNORMAL LOW (ref 3.5–5.0)
Alkaline Phosphatase: 66 U/L (ref 38–126)
Anion gap: 12 (ref 5–15)
BUN: 30 mg/dL — ABNORMAL HIGH (ref 8–23)
CO2: 19 mmol/L — ABNORMAL LOW (ref 22–32)
Calcium: 8.7 mg/dL — ABNORMAL LOW (ref 8.9–10.3)
Chloride: 98 mmol/L (ref 98–111)
Creatinine, Ser: 1.09 mg/dL — ABNORMAL HIGH (ref 0.44–1.00)
GFR calc Af Amer: 56 mL/min — ABNORMAL LOW (ref >60)
GFR calc non Af Amer: 48 mL/min — ABNORMAL LOW (ref >60)
GLUCOSE: 97 mg/dL (ref 70–99)
Potassium: 4.2 mmol/L (ref 3.5–5.1)
Sodium: 129 mmol/L — ABNORMAL LOW (ref 135–145)
TOTAL PROTEIN: 5.9 g/dL — AB (ref 6.5–8.1)
Total Bilirubin: 1.1 mg/dL (ref 0.3–1.2)

## 2018-11-21 LAB — FOLATE RBC
Folate, Hemolysate: 400 ng/mL
Folate, RBC: 1404 ng/mL (ref >498)
Hematocrit: 28.5 % — ABNORMAL LOW (ref 34.0–46.6)

## 2018-11-21 LAB — BPAM RBC
BLOOD PRODUCT EXPIRATION DATE: 202003032359
Blood Product Expiration Date: 202002292359
ISSUE DATE / TIME: 202002121129
ISSUE DATE / TIME: 202002121432
Unit Type and Rh: 6200
Unit Type and Rh: 6200

## 2018-11-21 LAB — GLUCOSE, CAPILLARY
Glucose-Capillary: 109 mg/dL — ABNORMAL HIGH (ref 70–99)
Glucose-Capillary: 205 mg/dL — ABNORMAL HIGH (ref 70–99)
Glucose-Capillary: 133 mg/dL — ABNORMAL HIGH (ref 70–99)

## 2018-11-21 LAB — CBC
HEMATOCRIT: 27 % — AB (ref 36.0–46.0)
Hemoglobin: 9.2 g/dL — ABNORMAL LOW (ref 12.0–15.0)
MCH: 29.3 pg (ref 26.0–34.0)
MCHC: 34.1 g/dL (ref 30.0–36.0)
MCV: 86 fL (ref 80.0–100.0)
Platelets: 198 10*3/uL (ref 150–400)
RBC: 3.14 MIL/uL — ABNORMAL LOW (ref 3.87–5.11)
RDW: 14.6 % (ref 11.5–15.5)
WBC: 4 10*3/uL (ref 4.0–10.5)
nRBC: 0 % (ref 0.0–0.2)

## 2018-11-21 LAB — PROTIME-INR
INR: 2.36
Prothrombin Time: 25.5 seconds — ABNORMAL HIGH (ref 11.4–15.2)

## 2018-11-21 LAB — MAGNESIUM: Magnesium: 1.7 mg/dL (ref 1.7–2.4)

## 2018-11-21 SURGERY — ESOPHAGOGASTRODUODENOSCOPY (EGD) WITH PROPOFOL
Anesthesia: Monitor Anesthesia Care

## 2018-11-21 MED ORDER — PROPOFOL 500 MG/50ML IV EMUL
INTRAVENOUS | Status: DC | PRN
  Administered 2018-11-21: 100 ug/kg/min via INTRAVENOUS

## 2018-11-21 MED ORDER — PANTOPRAZOLE SODIUM 40 MG IV SOLR
40.0000 mg | Freq: Two times a day (BID) | INTRAVENOUS | Status: DC
  Administered 2018-11-22: 40 mg via INTRAVENOUS
  Filled 2018-11-21: qty 40

## 2018-11-21 MED ORDER — SODIUM CHLORIDE 0.9 % IV SOLN
INTRAVENOUS | Status: DC
  Administered 2018-11-21: 12:00:00 via INTRAVENOUS

## 2018-11-21 MED ORDER — WARFARIN SODIUM 7.5 MG PO TABS
7.5000 mg | ORAL_TABLET | Freq: Once | ORAL | Status: AC
  Administered 2018-11-21: 7.5 mg via ORAL
  Filled 2018-11-21: qty 1

## 2018-11-21 MED ORDER — PROPOFOL 10 MG/ML IV BOLUS
INTRAVENOUS | Status: DC | PRN
  Administered 2018-11-21: 40 mg via INTRAVENOUS

## 2018-11-21 MED ORDER — WARFARIN - PHARMACIST DOSING INPATIENT: Freq: Every day | Status: DC

## 2018-11-21 MED ORDER — BENZONATATE 100 MG PO CAPS
200.0000 mg | ORAL_CAPSULE | Freq: Three times a day (TID) | ORAL | Status: DC | PRN
  Administered 2018-11-21: 200 mg via ORAL
  Filled 2018-11-21: qty 2

## 2018-11-21 SURGICAL SUPPLY — 15 items

## 2018-11-21 NOTE — Anesthesia Preprocedure Evaluation (Addendum)
Anesthesia Evaluation  Patient identified by MRN, date of birth, ID band Patient awake    Reviewed: Allergy & Precautions, NPO status , Patient's Chart, lab work & pertinent test results, reviewed documented beta blocker date and time   Airway Mallampati: I  TM Distance: >3 FB Neck ROM: Full    Dental no notable dental hx. (+) Edentulous Upper, Edentulous Lower   Pulmonary neg pulmonary ROS, former smoker,    Pulmonary exam normal breath sounds clear to auscultation       Cardiovascular hypertension, Pt. on home beta blockers and Pt. on medications + CAD, + CABG, + Peripheral Vascular Disease (carotid artery stenosis) and +CHF  Normal cardiovascular exam+ dysrhythmias Atrial Fibrillation + Valvular Problems/Murmurs (s/p AVR 2005)  Rhythm:Regular Rate:Normal  LHC 2019 1. Mildly elevated PCWP, normal RA pressure.  2. Preserved cardiac output.   3. Moderate pulmonary arterial hypertension.  PVR is only 3.1 WU due to wide PA pulse pressure.  TTE 02/2018 EF 55-60%, grade 3 DD, mechanical AV functioning appropriately, severe TR, mod pulm HTN on tadalfil    Neuro/Psych PSYCHIATRIC DISORDERS Anxiety negative neurological ROS     GI/Hepatic Neg liver ROS, GERD  ,  Endo/Other  diabetes, Type 2  Renal/GU negative Renal ROS  negative genitourinary   Musculoskeletal negative musculoskeletal ROS (+)   Abdominal   Peds  Hematology  (+) Blood dyscrasia (Hgb 9.2 s/p 2U RBCs), anemia ,   Anesthesia Other Findings On coumadin, last dose 11/20/18 AM  Reproductive/Obstetrics negative OB ROS                            Anesthesia Physical Anesthesia Plan  ASA: III  Anesthesia Plan: MAC   Post-op Pain Management:    Induction: Intravenous  PONV Risk Score and Plan: 2 and Propofol infusion  Airway Management Planned: Natural Airway  Additional Equipment:   Intra-op Plan:   Post-operative Plan:    Informed Consent: I have reviewed the patients History and Physical, chart, labs and discussed the procedure including the risks, benefits and alternatives for the proposed anesthesia with the patient or authorized representative who has indicated his/her understanding and acceptance.     Dental advisory given  Plan Discussed with: CRNA  Anesthesia Plan Comments:         Anesthesia Quick Evaluation

## 2018-11-21 NOTE — Op Note (Signed)
EGD was performed for chronic anemia and dark stools while on Coumadin.  Findings: Normal  appearing esophagus Few, minimally oozing, with cold trauma, superficial gastric ulcers with pigmented material in gastric body. Rest of the stomach duodenal bulb and duodenum appeared unremarkable. A small bowel PillCam was deployed in the duodenal bulb.   Recommendations: N.p.o. for 2 hours and follow instructions as for small bowel PillCam study. Continue Protonix OK to resume IV heparin and Coumadin Monitor H&H and transfuse as needed  [name], MD

## 2018-11-21 NOTE — Anesthesia Procedure Notes (Signed)
Procedure Name: MAC Performed by: Valda Favia, CRNA Oxygen Delivery Method: Nasal cannula Airway Equipment and Method: Bite block Placement Confirmation: positive ETCO2 Dental Injury: Teeth and Oropharynx as per pre-operative assessment

## 2018-11-21 NOTE — Progress Notes (Signed)
Patient ID: [name], female   DOB: 1941/05/14, 78 y.o.   MRN: [id number]  PROGRESS NOTE    ELONDA GIULIANO  UXL:[id number] DOB: Nov 08, 1940 DOA: 11/20/2018 PCP: [name], MD   Brief Narrative:  78 year old female with history of CAD status post CABG, mechanical aortic valve replacement, chronic diastolic heart failure, diabetes mellitus type 2, essential hypertension, hyperlipidemia was sent to the hospital for evaluation of low hemoglobin.  She was found to have hemoglobin of 7.2.  She was transfused 2 units of packed red cells.  GI was consulted.  She was started on IV Protonix.   Assessment & Plan:   Principal Problem:   GI bleed Active Problems:   Hypertension   Hypercholesteremia   Mechanical heart valve present   Chronic anticoagulation   Hyponatremia   Coronary artery disease   Carotid artery disease (HCC)   Peripheral arterial disease (HCC)   Diabetes mellitus without complication (HCC)   Atrial fibrillation (HCC)   Symptomatic anemia   GERD (gastroesophageal reflux disease)   Chronic diastolic CHF (congestive heart failure) (HCC)   Chronic atrial fibrillation   H/O mechanical aortic valve replacement   Moderate to severe pulmonary hypertension (HCC)   Probable upper GI bleeding presenting with symptomatic anemia -Hemoglobin was 7.2 on admission.  Status post 2 unit packed red cells transfusion.  GI evaluation appreciated.  She is supposed to have EGD and capsule endoscopy today.  Continue Protonix drip. -Hemoglobin 9.2 this morning.  Monitor H&H  Coronary disease status post CABG/carotid artery disease/peripheral arterial disease with history of mechanical aortic valve replacement and chronic atrial fibrillation -Currently chest pain-free.  Heparin drip on hold for EGD this morning.  Continue statin, metoprolol, Aldactone, Lasix and hydralazine.  Resume Coumadin once cleared by gastroenterology, goal INR is 5.3-6.6  Chronic diastolic heart  failure Compensated.  Continue Lasix, hydralazine, Aldactone and metoprolol.  Strict input and output and daily weights  Essential hypertension -Antihypertensives as above.  Monitor blood pressure  Moderate to severe pulmonary hypertension -continue tadalafil.  Outpatient follow-up  There is mellitus type II -continue sliding scale insulin with Accu-Cheks  Chronic hyponatremia -Baseline sodium is around 130.  Today is 129.  Monitor  Hyperlipidemia-continue statin  GERD  -on PPI   DVT prophylaxis: Heparin drip on hold for now for EGD Code Status: Full Family Communication: None at bedside Disposition Plan: Home once cleared by GI  Consultants: GI  Procedures: None  Antimicrobials: None   Subjective: Patient seen and examined at bedside.  She denies any overnight fever, nausea, vomiting, black or bloody stools.  Objective: Vitals:   11/20/18 1852 11/20/18 2013 11/21/18 0230 11/21/18 0547  BP: (!) 191/47 (!) 160/56  (!) 149/45  Pulse: (!) 56 (!) 59  (!) 51  Resp: (!) 21 17  16   Temp:  98 F (36.7 C)  98.7 F (37.1 C)  TempSrc:  Oral  Oral  SpO2: 97% 98%  97%  Weight:      Height:   5\' 1"  (1.549 m)     Intake/Output Summary (Last 24 hours) at 11/21/2018 1017 Last data filed at 11/21/2018 0546 Gross per 24 hour  Intake 2075.65 ml  Output 800 ml  Net 1275.65 ml   Filed Weights   11/20/18 1340  Weight: 59.4 kg    Examination:  General exam: Appears calm and comfortable  Respiratory system: Bilateral decreased breath sounds at bases Cardiovascular system: S1 & S2 heard, mildly bradycardic Gastrointestinal system: Abdomen is nondistended, soft and  nontender. Normal bowel sounds heard. Extremities: No cyanosis, clubbing; trace edema Data Reviewed: I have personally reviewed following labs and imaging studies  CBC: Recent Labs  Lab 11/20/18 0950 11/20/18 2124 11/21/18 0407  WBC 4.3  --  4.0  HGB 7.2* 9.7* 9.2*  HCT 23.0* 28.6* 27.0*  MCV 92.4  --   86.0  PLT 206  --  578   Basic Metabolic Panel: Recent Labs  Lab 11/20/18 0950 11/21/18 0407  NA 128* 129*  K 4.9 4.2  CL 96* 98  CO2 19* 19*  GLUCOSE 234* 97  BUN 39* 30*  CREATININE 1.24* 1.09*  CALCIUM 9.1 8.7*  MG  --  1.7   GFR: Estimated Creatinine Clearance: 34.6 mL/min (A) (by C-G formula based on SCr of 1.09 mg/dL (H)). Liver Function Tests: Recent Labs  Lab 11/20/18 0950 11/21/18 0407  AST 29 30  ALT 19 16  ALKPHOS 69 66  BILITOT 0.7 1.1  PROT 6.6 5.9*  ALBUMIN 3.3* 2.9*   No results for input(s): LIPASE, AMYLASE in the last 168 hours. No results for input(s): AMMONIA in the last 168 hours. Coagulation Profile: Recent Labs  Lab 11/20/18 1021 11/21/18 0407  INR 2.04 2.36   Cardiac Enzymes: No results for input(s): CKTOTAL, CKMB, CKMBINDEX, TROPONINI in the last 168 hours. BNP (last 3 results) No results for input(s): PROBNP in the last 8760 hours. HbA1C: No results for input(s): HGBA1C in the last 72 hours. CBG: Recent Labs  Lab 11/20/18 2039 11/21/18 0818  GLUCAP 88 109*   Lipid Profile: No results for input(s): CHOL, HDL, LDLCALC, TRIG, CHOLHDL, LDLDIRECT in the last 72 hours. Thyroid Function Tests: No results for input(s): TSH, T4TOTAL, FREET4, T3FREE, THYROIDAB in the last 72 hours. Anemia Panel: Recent Labs    11/20/18 1824  VITAMINB12 1,552*  FERRITIN 36  TIBC 360  IRON 104   Sepsis Labs: No results for input(s): PROCALCITON, LATICACIDVEN in the last 168 hours.  No results found for this or any previous visit (from the past 240 hour(s)).       Radiology Studies: Dg Chest Portable 1 View  Result Date: 11/20/2018 CLINICAL DATA:  Anemia. EXAM: PORTABLE CHEST 1 VIEW COMPARISON:  07/11/2018 FINDINGS: The heart is enlarged but stable. There is tortuosity and calcification of the thoracic aorta. Stable surgical changes from coronary artery bypass surgery. No acute pulmonary findings are identified. No pleural effusions. Stable  calcified granulomas. IMPRESSION: Cardiac enlargement but no acute pulmonary findings. Electronically Signed   By: Marijo Sanes M.D.   On: 11/20/2018 11:25        Scheduled Meds: . sodium chloride   Intravenous Once  . ALPRAZolam  0.25 mg Oral QHS  . atorvastatin  80 mg Oral q1800  . ferrous sulfate  325 mg Oral BID WC  . furosemide  80 mg Oral Daily  . hydrALAZINE  100 mg Oral TID  . insulin aspart  0-15 Units Subcutaneous TID WC  . metoprolol tartrate  12.5 mg Oral BID  . [START ON 11/23/2018] pantoprazole  40 mg Intravenous Q12H  . spironolactone  12.5 mg Oral Daily  . tadalafil  20 mg Oral BID  . vitamin B-12  100 mcg Oral Q1200   Continuous Infusions: . pantoprozole (PROTONIX) infusion 8 mg/hr (11/21/18 0955)     LOS: 0 days        [name], MD [facility] Pager [phone]  If 7PM-7AM, please contact night-coverage [email or web address] Password [facility] 11/21/2018, 10:17 AM

## 2018-11-21 NOTE — Op Note (Signed)
[facility] Patient Name: [name] Procedure Date : 11/21/2018 MRN: [id number] Attending MD: [name] , MD Date of Birth: [birth year] CSN: [id number] Age: 78 Admit Type: Inpatient Procedure:                Upper GI endoscopy Indications:              Iron deficiency anemia secondary to chronic blood                            loss, Melena Providers:                [name], MD, Jeanella Cara, RN, Cletis Athens, Technician, Edmonia James, CRNA Referring MD:              Medicines:                Monitored Anesthesia Care Complications:            No immediate complications. Estimated blood loss:                            None. Estimated Blood Loss:     Estimated blood loss: none. Procedure:                Pre-Anesthesia Assessment:                           - Prior to the procedure, a History and Physical                            was performed, and patient medications and                            allergies were reviewed. [name] tolerance of                            previous anesthesia was also reviewed. The risks                            and benefits of the procedure and the sedation                            options and risks were discussed with the patient.                            All questions were answered, and informed consent                            was obtained. Prior Anticoagulants: The patient has                            taken Coumadin (warfarin), last dose was 1 day  prior to procedure and heparin drip was stopped                            after midnight. INR was 2.36 today morning ASA                            Grade Assessment: III - A patient with severe                            systemic disease. After reviewing the risks and                            benefits, the patient was deemed in satisfactory                            condition to undergo the procedure.                           After obtaining informed consent, the endoscope was                            passed under direct vision. Throughout the                            procedure, the patient's blood pressure, pulse, and                            oxygen saturations were monitored continuously. The                            GIF-H190 ([id number]) Olympus gastroscope was                            introduced through the mouth, and advanced to the                            second part of duodenum. The upper GI endoscopy was                            accomplished without difficulty. The patient                            tolerated the procedure well. Scope In: Scope Out: Findings:      The examined esophagus was normal.      The Z-line was regular and was found 38 cm from the incisors.      Few minimally oozing( with scope trauma) superficial gastric ulcers with       pigmented material were found in the gastric body. The largest lesion       was 4 mm in largest dimension.      The cardia and gastric fundus were normal on retroflexion.      The cardia, incisura, gastric antrum, prepyloric region of the stomach       and pylorus were normal.      The examined duodenum  was normal. Using the endoscope, the video capsule       enteroscope was advanced into the duodenal bulb. Impression:               - Normal esophagus.                           - Z-line regular, 38 cm from the incisors.                           - Minimally oozing (with scope trauma) small                            gastric ulcers with pigmented material.                           - Normal cardia, incisura, antrum, prepyloric                            region of the stomach and pylorus.                           - Normal examined duodenum.                           - Successful completion of the Video Capsule                            Enteroscope placement.                           - No specimens collected. Moderate  Sedation:      Patient did not receive moderate sedation for this procedure, but       instead received monitored anesthesia care. Recommendation:           - NPO for 2 hours, then follow instructions as per                            pillcam deployment.                           - Use Protonix (pantoprazole) 40 mg PO BID. Procedure Code(s):        --- Professional ---                           [id number], Esophagogastroduodenoscopy, flexible,                            transoral; diagnostic, including collection of                            specimen(s) by brushing or washing, when performed                            (separate procedure) Diagnosis Code(s):        --- Professional ---  K25.9, Gastric ulcer, unspecified as acute or                            chronic, without hemorrhage or perforation                           D50.0, Iron deficiency anemia secondary to blood                            loss (chronic)                           K92.1, Melena (includes Hematochezia) CPT copyright 2018 [employer] Association. All rights reserved. The codes documented in this report are preliminary and upon coder review may  be revised to meet current compliance requirements. [name], MD 11/21/2018 12:09:42 PM This report has been signed electronically. Number of Addenda: 0

## 2018-11-21 NOTE — Brief Op Note (Signed)
11/20/2018 - 11/21/2018  12:10 PM  PATIENT:  [name]  78 y.o. female  PRE-OPERATIVE DIAGNOSIS:  Anemia, dark stools, on coumadin  POST-OPERATIVE DIAGNOSIS:  pigmented gastric erosions. Pill cam deployed in doudenal bulb   PROCEDURE:  Procedure(s) with comments: ESOPHAGOGASTRODUODENOSCOPY (EGD) WITH PROPOFOL (N/A) GIVENS CAPSULE STUDY (N/A) - To be deployed during EGD  SURGEON:  Surgeon(s) and Role:    * [name], MD - Primary  PHYSICIAN ASSISTANT:   ASSISTANTS: Jeanella Cara, RN, [place] Parker, Tech   ANESTHESIA:   MAC  EBL:  Minimal  BLOOD ADMINISTERED:none  DRAINS: none   LOCAL MEDICATIONS USED:  NONE  SPECIMEN:  No Specimen  DISPOSITION OF SPECIMEN:  N/A  COUNTS:  YES  TOURNIQUET:  * No tourniquets in log *  DICTATION: .Dragon Dictation  PLAN OF CARE: Admit to inpatient   PATIENT DISPOSITION:  PACU - hemodynamically stable.   Delay start of Pharmacological VTE agent (>24hrs) due to surgical blood loss or risk of bleeding: no

## 2018-11-21 NOTE — Interval H&P Note (Signed)
History and Physical Interval Note: 77/female with anemia and dark stools, on coumadin for an EGD and pillcam deployment.  11/21/2018 11:31 AM  [name]  has presented today for EGD and pillcam deployment, with the diagnosis of Anemia, dark stools, on coumadin  The various methods of treatment have been discussed with the patient and family. After consideration of risks, benefits and other options for treatment, the patient has consented to  Procedure(s) with comments: ESOPHAGOGASTRODUODENOSCOPY (EGD) WITH PROPOFOL (N/A) GIVENS CAPSULE STUDY (N/A) - To be deployed during EGD as a surgical intervention .  [name] history has been reviewed, patient examined, no change in status, stable for surgery.  I have reviewed the patient's chart and labs.  Questions were answered to the patient's satisfaction.     [name]

## 2018-11-21 NOTE — Transfer of Care (Signed)
Immediate Anesthesia Transfer of Care Note  Patient: [name]  Procedure(s) Performed: ESOPHAGOGASTRODUODENOSCOPY (EGD) WITH PROPOFOL (N/A ) GIVENS CAPSULE STUDY (N/A )  Patient Location: Endoscopy Unit  Anesthesia Type:MAC  Level of Consciousness: awake, alert  and oriented  Airway & Oxygen Therapy: Patient Spontanous Breathing  Post-op Assessment: Report given to RN and Post -op Vital signs reviewed and stable  Post vital signs: Reviewed and stable  Last Vitals:  Vitals Value Taken Time  BP 167/42 11/21/2018 12:14 PM  Temp    Pulse 55 11/21/2018 12:14 PM  Resp 23 11/21/2018 12:14 PM  SpO2 98 % 11/21/2018 12:14 PM  Vitals shown include unvalidated device data.  Last Pain:  Vitals:   11/21/18 1213  TempSrc: Oral  PainSc:          Complications: No apparent anesthesia complications

## 2018-11-21 NOTE — Progress Notes (Signed)
[place] NOTE - Initial Consult  Pharmacy Consult for heparin Indication: AFib + mechanical AVR  Allergies  Allergen Reactions  . Flagyl [Metronidazole] Rash    ALL-OVER BODY RASH  . Coreg [Carvedilol] Other (See Comments)    Terrible cramping in the feet and had a lot of bowel movements, but not diarrhea  . Losartan Swelling    Patient doesn't recall site of swelling  . Verapamil Hives  . Zetia [Ezetimibe] Other (See Comments)    Reaction not recalled  . Zocor [Simvastatin - High Dose] Other (See Comments)    Reaction not recalled    Patient Measurements: Height: 5\' 1"  (154.9 cm) Weight: 131 lb (59.4 kg) IBW/kg (Calculated) : 47.8 Heparin Dosing Weight: 59kg  Vital Signs: Temp: 97.7 F (36.5 C) (02/13 1213) Temp Source: Oral (02/13 1213) BP: 176/34 (02/13 1230) Pulse Rate: 49 (02/13 1230)  Labs: Recent Labs    11/20/18 0950 11/20/18 1021 11/20/18 2124 11/21/18 0407  HGB 7.2*  --  9.7* 9.2*  HCT 23.0*  --  28.6* 27.0*  PLT 206  --   --  198  LABPROT  --  22.8*  --  25.5*  INR  --  2.04  --  2.36  CREATININE 1.24*  --   --  1.09*    Estimated Creatinine Clearance: 34.6 mL/min (A) (by C-G formula based on SCr of 1.09 mg/dL (H)).   Medical History: Past Medical History:  Diagnosis Date  . Aortic atherosclerosis ([place]) 01/23/2018  . Atrial fibrillation ([place])   . Carotid artery disease ([place])   . Cholelithiasis 01/23/2018  . Chronic anticoagulation   . Coronary artery disease    status post coronary artery bypass grafting times 07/10/2004  . Diabetes mellitus   . Hypercholesteremia   . Hypertension   . Mechanical heart valve present    H. aortic valve replacement at the time of bypass surgery October 2005  . Moderate to severe pulmonary hypertension ([place])   . Peripheral arterial disease (HCC)    history of left common iliac artery PTA and stenting for a chronic total occlusion 08/26/01    Assessment: 77 yoF on warfarin PTA for AFib and  mechanical AVR with last dose this morning admitted with anemia. INR 2.04 on admit (goal 2-3 per outpatient notes). Hgb 7.2, FOBT positive, GI planning EGD tomorrow.  Pharmacy initially consulted to start heparin infusion and stop at midnight in anticipation of GI procedures tomorrow. However, after chart review, patient's INR goal was reduced in June 2019 by cardiology to 2-3 (from usual 2.5-3.5) due to GI bleeding hx and anemia. Last Coumadin Clinic notes on 11/01/18 also utilize INR goal of 2-3.   Now s/p EGD, OK to resume per GI, and will resume warfarin alone given therapeutic INR today at 2.36.  PTA dosing: 5mg  Mon/Fri, 7.5mg  all other days  Goal of Therapy:  INR 2-3 Monitor platelets by anticoagulation protocol: Yes   Plan:  Warfarin 7.5mg  PO x 1 tonight Daily INR, s/s bleeding  Bertis Ruddy, PharmD Clinical Pharmacist Please check AMION for all [place] numbers 11/21/2018 1:03 PM

## 2018-11-21 NOTE — Progress Notes (Signed)
Pill cam capsule deployed into duodenal bulb by MD Therisa Doyne via EGD at 1202.

## 2018-11-22 DIAGNOSIS — I272 Pulmonary hypertension, unspecified: Secondary | ICD-10-CM | POA: Diagnosis not present

## 2018-11-22 DIAGNOSIS — I5032 Chronic diastolic (congestive) heart failure: Secondary | ICD-10-CM | POA: Diagnosis not present

## 2018-11-22 DIAGNOSIS — E871 Hypo-osmolality and hyponatremia: Secondary | ICD-10-CM | POA: Diagnosis not present

## 2018-11-22 DIAGNOSIS — K254 Chronic or unspecified gastric ulcer with hemorrhage: Secondary | ICD-10-CM | POA: Diagnosis not present

## 2018-11-22 DIAGNOSIS — I4891 Unspecified atrial fibrillation: Secondary | ICD-10-CM | POA: Diagnosis not present

## 2018-11-22 DIAGNOSIS — Z952 Presence of prosthetic heart valve: Secondary | ICD-10-CM | POA: Diagnosis not present

## 2018-11-22 DIAGNOSIS — K922 Gastrointestinal hemorrhage, unspecified: Secondary | ICD-10-CM | POA: Diagnosis not present

## 2018-11-22 DIAGNOSIS — I482 Chronic atrial fibrillation, unspecified: Secondary | ICD-10-CM | POA: Diagnosis not present

## 2018-11-22 DIAGNOSIS — D500 Iron deficiency anemia secondary to blood loss (chronic): Secondary | ICD-10-CM | POA: Diagnosis not present

## 2018-11-22 DIAGNOSIS — Z7901 Long term (current) use of anticoagulants: Secondary | ICD-10-CM | POA: Diagnosis not present

## 2018-11-22 LAB — BASIC METABOLIC PANEL
Anion gap: 7 (ref 5–15)
BUN: 30 mg/dL — ABNORMAL HIGH (ref 8–23)
CO2: 21 mmol/L — AB (ref 22–32)
Calcium: 8.7 mg/dL — ABNORMAL LOW (ref 8.9–10.3)
Chloride: 103 mmol/L (ref 98–111)
Creatinine, Ser: 1.24 mg/dL — ABNORMAL HIGH (ref 0.44–1.00)
GFR calc Af Amer: 48 mL/min — ABNORMAL LOW (ref >60)
GFR calc non Af Amer: 41 mL/min — ABNORMAL LOW (ref >60)
Glucose, Bld: 106 mg/dL — ABNORMAL HIGH (ref 70–99)
Potassium: 4.1 mmol/L (ref 3.5–5.1)
Sodium: 131 mmol/L — ABNORMAL LOW (ref 135–145)

## 2018-11-22 LAB — CBC WITH DIFFERENTIAL/PLATELET
Abs Immature Granulocytes: 0.02 10*3/uL (ref 0.00–0.07)
Basophils Absolute: 0 10*3/uL (ref 0.0–0.1)
Basophils Relative: 1 %
Eosinophils Absolute: 0.1 10*3/uL (ref 0.0–0.5)
Eosinophils Relative: 4 %
HEMATOCRIT: 29.5 % — AB (ref 36.0–46.0)
HEMOGLOBIN: 9.5 g/dL — AB (ref 12.0–15.0)
Immature Granulocytes: 1 %
Lymphocytes Relative: 18 %
Lymphs Abs: 0.6 10*3/uL — ABNORMAL LOW (ref 0.7–4.0)
MCH: 28 pg (ref 26.0–34.0)
MCHC: 32.2 g/dL (ref 30.0–36.0)
MCV: 87 fL (ref 80.0–100.0)
Monocytes Absolute: 0.3 10*3/uL (ref 0.1–1.0)
Monocytes Relative: 10 %
NEUTROS ABS: 2.1 10*3/uL (ref 1.7–7.7)
Neutrophils Relative %: 66 %
Platelets: 179 10*3/uL (ref 150–400)
RBC: 3.39 MIL/uL — ABNORMAL LOW (ref 3.87–5.11)
RDW: 14.3 % (ref 11.5–15.5)
WBC: 3.1 10*3/uL — ABNORMAL LOW (ref 4.0–10.5)
nRBC: 0 % (ref 0.0–0.2)

## 2018-11-22 LAB — GLUCOSE, CAPILLARY
Glucose-Capillary: 109 mg/dL — ABNORMAL HIGH (ref 70–99)
Glucose-Capillary: 134 mg/dL — ABNORMAL HIGH (ref 70–99)

## 2018-11-22 LAB — MAGNESIUM: Magnesium: 1.8 mg/dL (ref 1.7–2.4)

## 2018-11-22 LAB — PROTIME-INR
INR: 2.77
Prothrombin Time: 28.9 seconds — ABNORMAL HIGH (ref 11.4–15.2)

## 2018-11-22 MED ORDER — ATORVASTATIN CALCIUM 80 MG PO TABS: 80.0000 mg | ORAL_TABLET | Freq: Every day | ORAL | Status: DC

## 2018-11-22 MED ORDER — FERROUS SULFATE 325 (65 FE) MG PO TABS: 325.0000 mg | ORAL_TABLET | Freq: Two times a day (BID) | ORAL | Status: DC

## 2018-11-22 MED ORDER — TADALAFIL 20 MG PO TABS: 20.0000 mg | ORAL_TABLET | Freq: Two times a day (BID) | ORAL | Status: DC

## 2018-11-22 MED ORDER — WARFARIN SODIUM 5 MG PO TABS: 5.0000 mg | ORAL_TABLET | ORAL | Status: DC

## 2018-11-22 NOTE — Progress Notes (Signed)
[place] NOTE - Initial Consult  Pharmacy Consult for heparin Indication: AFib + mechanical AVR  Allergies  Allergen Reactions  . Flagyl [Metronidazole] Rash    ALL-OVER BODY RASH  . Coreg [Carvedilol] Other (See Comments)    Terrible cramping in the feet and had a lot of bowel movements, but not diarrhea  . Losartan Swelling    Patient doesn't recall site of swelling  . Verapamil Hives  . Zetia [Ezetimibe] Other (See Comments)    Reaction not recalled  . Zocor [Simvastatin - High Dose] Other (See Comments)    Reaction not recalled    Patient Measurements: Height: 5\' 1"  (154.9 cm) Weight: 128 lb 4.8 oz (58.2 kg) IBW/kg (Calculated) : 47.8 Heparin Dosing Weight: 59kg  Vital Signs: Temp: 98 F (36.7 C) (02/14 0430) Temp Source: Oral (02/14 0430) BP: 157/47 (02/14 0842) Pulse Rate: 61 (02/14 0842)  Labs: Recent Labs    11/20/18 0950 11/20/18 1021  11/20/18 2124 11/21/18 0407 11/22/18 0504 11/22/18 1001  HGB 7.2*  --   --  9.7* 9.2* 9.5*  --   HCT 23.0*  --    < > 28.6* 27.0* 29.5*  --   PLT 206  --   --   --  198 179  --   LABPROT  --  22.8*  --   --  25.5*  --  28.9*  INR  --  2.04  --   --  2.36  --  2.77  CREATININE 1.24*  --   --   --  1.09* 1.24*  --    < > = values in this interval not displayed.    Estimated Creatinine Clearance: 30.2 mL/min (A) (by C-G formula based on SCr of 1.24 mg/dL (H)).   Medical History: Past Medical History:  Diagnosis Date  . Aortic atherosclerosis ([place]) 01/23/2018  . Atrial fibrillation ([place])   . Carotid artery disease ([place])   . Cholelithiasis 01/23/2018  . Chronic anticoagulation   . Coronary artery disease    status post coronary artery bypass grafting times 07/10/2004  . Diabetes mellitus   . Hypercholesteremia   . Hypertension   . Mechanical heart valve present    H. aortic valve replacement at the time of bypass surgery October 2005  . Moderate to severe pulmonary hypertension ([place])   .  Peripheral arterial disease (HCC)    history of left common iliac artery PTA and stenting for a chronic total occlusion 08/26/01    Assessment: 77 yoF on warfarin PTA for AFib and mechanical AVR with last dose this morning admitted with anemia. INR 2.04 on admit (goal 2-3 per outpatient notes). Hgb 7.2, FOBT positive, GI planning EGD tomorrow.  Pharmacy initially consulted to start heparin infusion and stop at midnight in anticipation of GI procedures tomorrow. However, after chart review, patient's INR goal was reduced in June 2019 by cardiology to 2-3 (from usual 2.5-3.5) due to GI bleeding hx and anemia. Last Coumadin Clinic notes on 11/01/18 also utilize INR goal of 2-3.   Now s/p EGD, OK to resume per GI. INR remains therapeutic today at 2.77. Plan to dc home on home dose.   PTA dosing: 5mg  Mon/Fri, 7.5mg  all other days  Goal of Therapy:  INR 2-3 Monitor platelets by anticoagulation protocol: Yes   Plan:  Cont home dose at 7.5mg  qday except 5mg  MF Daily INR, s/s bleeding  Onnie Boer, PharmD, BCIDP, AAHIVP, CPP Infectious Disease Pharmacist 11/22/2018 12:41 PM

## 2018-11-22 NOTE — Progress Notes (Signed)
Small bowel pillcam appeared unremarkable. Small duodenal diverticulum noted. There was no evidence of active or recent small bowel bleeding. The pillcam exited in to the cecum at 3 hours and 41 minutes.  Recommendations: Ok to D/ C from GI standpoint. Continue oral iron supplementation. Continue Coumadin  for metallic aortic valve. Continue PPI BID for 4 weeks and the once a day , indefinitely , thereafter. F/U with Dr.Brahmbhatt in 2 weeks.  [name], MD

## 2018-11-22 NOTE — Anesthesia Postprocedure Evaluation (Signed)
Anesthesia Post Note  Patient: SHAMECCA WHITEBREAD  Procedure(s) Performed: ESOPHAGOGASTRODUODENOSCOPY (EGD) WITH PROPOFOL (N/A ) GIVENS CAPSULE STUDY (N/A )     Patient location during evaluation: Endoscopy Anesthesia Type: MAC Level of consciousness: awake and alert Pain management: pain level controlled Vital Signs Assessment: post-procedure vital signs reviewed and stable Respiratory status: spontaneous breathing, nonlabored ventilation, respiratory function stable and patient connected to nasal cannula oxygen Cardiovascular status: blood pressure returned to baseline and stable Postop Assessment: no apparent nausea or vomiting Anesthetic complications: no    Last Vitals:  Vitals:   11/21/18 2155 11/22/18 0430  BP: (!) 138/28 (!) 168/46  Pulse:  (!) 52  Resp:  19  Temp:  36.7 C  SpO2:  97%    Last Pain:  Vitals:   11/22/18 0430  TempSrc: Oral  PainSc:                  Arneta Mahmood L [place]

## 2018-11-22 NOTE — Discharge Summary (Addendum)
Physician Discharge Summary  CHE BELOW UMP:[id number] DOB: [birth year] DOA: 11/20/2018  PCP: [name], MD  Admit date: 11/20/2018 Discharge date: 11/22/2018  Admitted From: Home Disposition: Home  Recommendations for Outpatient Follow-up:  1. Follow up with PCP in 1 week with repeat CBC/BMP/INR 2. Follow-up with GI as an outpatient 3. Follow-up in the ED if symptoms worsen or new appear   Home Health: No Equipment/Devices: None  Discharge Condition: Stable CODE STATUS: Full Diet recommendation: Heart Healthy / Carb Modified Brief/Interim Summary: 78 year old female with history of CAD status post CABG, mechanical aortic valve replacement, chronic diastolic heart failure, diabetes mellitus type 2, essential hypertension, hyperlipidemia was sent to the hospital for evaluation of low hemoglobin.  She was found to have hemoglobin of 7.2.  She was transfused 2 units of packed red cells.  GI was consulted.  She was started on IV Protonix.  She underwent EGD which showed few minimally oozing superficial gastric ulcers.  Capsule endoscopy was unremarkable.  Her hemoglobin has remained stable.  GI has cleared the patient for discharge on oral Protonix.  Coumadin has been restarted.  Outpatient follow-up with PCP with repeat CBC.   Discharge Diagnoses:  Principal Problem:   GI bleed Active Problems:   Hypertension   Hypercholesteremia   Mechanical heart valve present   Chronic anticoagulation   Hyponatremia   Coronary artery disease   Carotid artery disease (HCC)   Peripheral arterial disease (HCC)   Diabetes mellitus without complication (HCC)   Atrial fibrillation (HCC)   Symptomatic anemia   GERD (gastroesophageal reflux disease)   Chronic diastolic CHF (congestive heart failure) (HCC)   Chronic atrial fibrillation   H/O mechanical aortic valve replacement   Moderate to severe pulmonary hypertension (HCC)  Probable upper GI bleeding presenting with symptomatic  anemia -Hemoglobin was 7.2 on admission.  Status post 2 unit packed red cells transfusion.  She was started on Protonix drip.  GI evaluated the patient. -She underwent EGD which showed few minimally oozing superficial gastric ulcers.  Capsule endoscopy was unremarkable.  -Hemoglobin 9.5 this morning.    No further bleeding. -GI has cleared the patient for discharge on oral Protonix 40 mg twice a day for a month and then once a day afterwards.  Outpatient follow-up with GI.  Outpatient follow-up with PCP within a week with repeat CBC.  Coronary disease status post CABG/carotid artery disease/peripheral arterial disease with history of mechanical aortic valve replacement and chronic atrial fibrillation -Currently chest pain-free.  Continue statin, Aldactone, Lasix and hydralazine.    Initially was started on heparin drip and post EGD she has already been started on Coumadin.  Continue Coumadin.  Outpatient follow-up with repeat INR. -Hold metoprolol because of bradycardia  Chronic diastolic heart failure Compensated.  Continue Lasix, hydralazine, Aldactone.  Hold metoprolol.   Outpatient follow-up with cardiology  Essential hypertension -Antihypertensives as above.  Monitor blood pressure  Moderate to severe pulmonary hypertension -continue tadalafil.  Outpatient follow-up  Diabetes mellitus type II -Carb modified diet.  Outpatient follow-up  Chronic hyponatremia -Baseline sodium is around 130.  Today is 131.  Outpatient follow-up  Hyperlipidemia-continue statin  GERD  -on PPI   Discharge Instructions  Discharge Instructions    Call MD for:  difficulty breathing, headache or visual disturbances   Complete by:  As directed    Call MD for:  extreme fatigue   Complete by:  As directed    Call MD for:  hives   Complete by:  As directed  Call MD for:  persistant dizziness or light-headedness   Complete by:  As directed    Call MD for:  persistant nausea and vomiting    Complete by:  As directed    Call MD for:  severe uncontrolled pain   Complete by:  As directed    Call MD for:  temperature >100.4   Complete by:  As directed    Diet - low sodium heart healthy   Complete by:  As directed    Diet Carb Modified   Complete by:  As directed    Increase activity slowly   Complete by:  As directed      Allergies as of 11/22/2018      Reactions   Flagyl [metronidazole] Rash   ALL-OVER BODY RASH   Coreg [carvedilol] Other (See Comments)   Terrible cramping in the feet and had a lot of bowel movements, but not diarrhea   Losartan Swelling   Patient doesn't recall site of swelling   Verapamil Hives   Zetia [ezetimibe] Other (See Comments)   Reaction not recalled   Zocor [simvastatin - High Dose] Other (See Comments)   Reaction not recalled      Medication List    STOP taking these medications   metoprolol tartrate 25 MG tablet Commonly known as:  LOPRESSOR   tadalafil (PAH) 20 MG tablet Commonly known as:  ADCIRCA     TAKE these medications   acetaminophen 325 MG tablet Commonly known as:  TYLENOL Take 2 tablets (650 mg total) by mouth every 6 (six) hours as needed for mild pain, fever or headache (for pain or headaches). What changed:  how much to take   ALPRAZolam 0.5 MG tablet Commonly known as:  XANAX Take 0.25 mg by mouth at bedtime.   atorvastatin 80 MG tablet Commonly known as:  LIPITOR Take 1 tablet (80 mg total) by mouth daily at 6 PM.   COLCRYS 0.6 MG tablet Generic drug:  colchicine Take 0.6 mg by mouth daily as needed (gout flare).   ferrous sulfate 325 (65 FE) MG tablet Take 1 tablet (325 mg total) by mouth 2 (two) times daily with a meal. What changed:  additional instructions   furosemide 40 MG tablet Commonly known as:  LASIX Take 80 mg by mouth daily.   hydrALAZINE 100 MG tablet Commonly known as:  APRESOLINE Take 1 tablet (100 mg total) by mouth 3 (three) times daily.   irbesartan 300 MG tablet Commonly  known as:  AVAPRO TAKE 1 TABLET DAILY (PLEASE MAKE APPOINTMENT FOR REFILLS) What changed:  See the new instructions.   magnesium oxide 400 (241.3 Mg) MG tablet Commonly known as:  MAG-OX Take 1 tablet (400 mg total) by mouth daily. What changed:  additional instructions   metFORMIN 500 MG tablet Commonly known as:  GLUCOPHAGE Take 500 mg by mouth 2 (two) times daily with a meal.   ondansetron 4 MG tablet Commonly known as:  ZOFRAN Take 4 mg by mouth every 8 (eight) hours as needed for nausea or vomiting.   pantoprazole 40 MG tablet Commonly known as:  PROTONIX Take 40 mg by mouth 2 (two) times daily.   spironolactone 25 MG tablet Commonly known as:  ALDACTONE Take 0.5 tablets (12.5 mg total) by mouth daily.   SYSTANE PRESERVATIVE FREE 0.4-0.3 % Soln Generic drug:  Polyethyl Glyc-Propyl Glyc PF Place 1-2 drops into both eyes 3 (three) times daily as needed (for irritation).   tadalafil 20 MG tablet Commonly known  as:  ADCIRCA/CIALIS Take 1 tablet (20 mg total) by mouth 2 (two) times daily.   vitamin B-12 100 MCG tablet Commonly known as:  CYANOCOBALAMIN Take 100 mcg by mouth daily at 12 noon.   Vitamin D3 25 MCG (1000 UT) Caps Take 1,000 Units by mouth daily. 12 noon   warfarin 5 MG tablet Commonly known as:  COUMADIN Take as directed. If you are unsure how to take this medication, talk to your nurse or doctor. Original instructions:  Take 1-1.5 tablets (5-7.5 mg total) by mouth See admin instructions. Take 5 mg on Monday and Friday take in the morning Tale 7.5 mg all the other days       Follow-up Information    [name], [name], MD. Schedule an appointment as soon as possible for a visit in 1 week(s).   Specialty:  Internal Medicine Why:  With repeat CBC/BMP Contact information: [place] [address] [phone]        Lorretta Harp, MD .   Specialties:  Cardiology, Radiology Contact information: [address] [place] [address] [phone]        Otis Brace, MD. Schedule an appointment as soon as possible for a visit in 2 week(s).   Specialty:  Gastroenterology Contact information: [place] [address] [phone]          Allergies  Allergen Reactions  . Flagyl [Metronidazole] Rash    ALL-OVER BODY RASH  . Coreg [Carvedilol] Other (See Comments)    Terrible cramping in the feet and had a lot of bowel movements, but not diarrhea  . Losartan Swelling    Patient doesn't recall site of swelling  . Verapamil Hives  . Zetia [Ezetimibe] Other (See Comments)    Reaction not recalled  . Zocor [Simvastatin - High Dose] Other (See Comments)    Reaction not recalled    Consultations:  GI   Procedures/Studies: Dg Chest Portable 1 View  Result Date: 11/20/2018 CLINICAL DATA:  Anemia. EXAM: PORTABLE CHEST 1 VIEW COMPARISON:  07/11/2018 FINDINGS: The heart is enlarged but stable. There is tortuosity and calcification of the thoracic aorta. Stable surgical changes from coronary artery bypass surgery. No acute pulmonary findings are identified. No pleural effusions. Stable calcified granulomas. IMPRESSION: Cardiac enlargement but no acute pulmonary findings. Electronically Signed   By: Marijo Sanes M.D.   On: 11/20/2018 11:25    EGD Findings: Normal  appearing esophagus Few, minimally oozing, with cold trauma, superficial gastric ulcers with pigmented material in gastric body. Rest of the stomach duodenal bulb and duodenum appeared unremarkable. A small bowel PillCam was deployed in the duodenal bulb.  Capsule endoscopy: Unremarkable   Subjective: Patient seen and examined at bedside.  She denies any overnight bloody bowel movements.  No overnight fever, nausea, vomiting or worsening abdominal pain.  Discharge Exam: Vitals:   11/22/18 0430 11/22/18 0842  BP: (!) 168/46 (!) 157/47  Pulse: (!) 52 61  Resp: 19   Temp: 98 F (36.7 C)    SpO2: 97%    Vitals:   11/21/18 2155 11/22/18 0430 11/22/18 0500 11/22/18 0842  BP: (!) 138/28 (!) 168/46  (!) 157/47  Pulse:  (!) 52  61  Resp:  19    Temp:  98 F (36.7 C)    TempSrc:  Oral    SpO2:  97%    Weight:   58.2 kg   Height:        General:  Pt is alert, awake, not in acute distress Cardiovascular: Mild bradycardia, S1/S2 + Respiratory: bilateral decreased breath sounds at bases Abdominal: Soft, NT, ND, bowel sounds + Extremities: Trace edema, no cyanosis    The results of significant diagnostics from this hospitalization (including imaging, microbiology, ancillary and laboratory) are listed below for reference.     Microbiology: No results found for this or any previous visit (from the past 240 hour(s)).   Labs: BNP (last 3 results) Recent Labs    02/21/18 2354 03/06/18 1352 07/23/18 1117  BNP 690.0* 790.8* 854.6*   Basic Metabolic Panel: Recent Labs  Lab 11/20/18 0950 11/21/18 0407 11/22/18 0504  NA 128* 129* 131*  K 4.9 4.2 4.1  CL 96* 98 103  CO2 19* 19* 21*  GLUCOSE 234* 97 106*  BUN 39* 30* 30*  CREATININE 1.24* 1.09* 1.24*  CALCIUM 9.1 8.7* 8.7*  MG  --  1.7 1.8   Liver Function Tests: Recent Labs  Lab 11/20/18 0950 11/21/18 0407  AST 29 30  ALT 19 16  ALKPHOS 69 66  BILITOT 0.7 1.1  PROT 6.6 5.9*  ALBUMIN 3.3* 2.9*   No results for input(s): LIPASE, AMYLASE in the last 168 hours. No results for input(s): AMMONIA in the last 168 hours. CBC: Recent Labs  Lab 11/20/18 0950 11/20/18 1824 11/20/18 2124 11/21/18 0407 11/22/18 0504  WBC 4.3  --   --  4.0 3.1*  NEUTROABS  --   --   --   --  2.1  HGB 7.2*  --  9.7* 9.2* 9.5*  HCT 23.0* 28.5* 28.6* 27.0* 29.5*  MCV 92.4  --   --  86.0 87.0  PLT 206  --   --  198 179   Cardiac Enzymes: No results for input(s): CKTOTAL, CKMB, CKMBINDEX, TROPONINI in the last 168 hours. BNP: Invalid input(s): POCBNP CBG: Recent Labs  Lab 11/20/18 2039 11/21/18 0818 11/21/18 1731  11/21/18 2136 11/22/18 0750  GLUCAP 88 109* 205* 133* 109*   D-Dimer No results for input(s): DDIMER in the last 72 hours. Hgb A1c No results for input(s): HGBA1C in the last 72 hours. Lipid Profile No results for input(s): CHOL, HDL, LDLCALC, TRIG, CHOLHDL, LDLDIRECT in the last 72 hours. Thyroid function studies No results for input(s): TSH, T4TOTAL, T3FREE, THYROIDAB in the last 72 hours.  Invalid input(s): FREET3 Anemia work up Recent Labs    11/20/18 1824  VITAMINB12 1,552*  FERRITIN 36  TIBC 360  IRON 104   Urinalysis    Component Value Date/Time   COLORURINE YELLOW 02/24/2018 [place] 02/24/2018 1621   LABSPEC <1.005 (L) 02/24/2018 1621   PHURINE 5.5 02/24/2018 1621   GLUCOSEU NEGATIVE 02/24/2018 1621   HGBUR SMALL (A) 02/24/2018 1621   BILIRUBINUR NEGATIVE 02/24/2018 1621   KETONESUR NEGATIVE 02/24/2018 1621   PROTEINUR NEGATIVE 02/24/2018 1621   UROBILINOGEN 1.0 10/06/2011 1709   NITRITE NEGATIVE 02/24/2018 1621   LEUKOCYTESUR NEGATIVE 02/24/2018 1621   Sepsis Labs Invalid input(s): PROCALCITONIN,  WBC,  LACTICIDVEN Microbiology No results found for this or any previous visit (from the past 240 hour(s)).   Time coordinating discharge: 35 minutes  SIGNED:   [name], MD  [facility] 11/22/2018, 10:37 AM Pager: [phone]  If 7PM-7AM, please contact night-coverage [email or web address] Password [email or web address]

## 2018-11-25 ENCOUNTER — Ambulatory Visit (INDEPENDENT_AMBULATORY_CARE_PROVIDER_SITE_OTHER): Payer: Medicare HMO | Admitting: *Deleted

## 2018-11-25 DIAGNOSIS — Z952 Presence of prosthetic heart valve: Secondary | ICD-10-CM | POA: Diagnosis not present

## 2018-11-25 DIAGNOSIS — I4891 Unspecified atrial fibrillation: Secondary | ICD-10-CM

## 2018-11-25 DIAGNOSIS — Z7901 Long term (current) use of anticoagulants: Secondary | ICD-10-CM

## 2018-11-25 LAB — POCT INR: INR: 3 (ref 2.0–3.0)

## 2018-11-25 NOTE — Patient Instructions (Signed)
Description   Tomorrow take only 1 tablet, then Continue taking 1.5 tablets daily except 1 tablet on Mondays and Fridays.  Repeat INR in 2 weeks

## 2018-11-29 ENCOUNTER — Other Ambulatory Visit (HOSPITAL_COMMUNITY): Payer: Self-pay | Admitting: Cardiology

## 2018-12-04 ENCOUNTER — Encounter: Payer: Self-pay | Admitting: Cardiology

## 2018-12-04 DIAGNOSIS — Z8719 Personal history of other diseases of the digestive system: Secondary | ICD-10-CM | POA: Diagnosis not present

## 2018-12-04 DIAGNOSIS — I7389 Other specified peripheral vascular diseases: Secondary | ICD-10-CM | POA: Diagnosis not present

## 2018-12-04 DIAGNOSIS — Z952 Presence of prosthetic heart valve: Secondary | ICD-10-CM | POA: Diagnosis not present

## 2018-12-04 DIAGNOSIS — E7800 Pure hypercholesterolemia, unspecified: Secondary | ICD-10-CM | POA: Diagnosis not present

## 2018-12-04 DIAGNOSIS — K259 Gastric ulcer, unspecified as acute or chronic, without hemorrhage or perforation: Secondary | ICD-10-CM | POA: Diagnosis not present

## 2018-12-04 DIAGNOSIS — I5032 Chronic diastolic (congestive) heart failure: Secondary | ICD-10-CM | POA: Diagnosis not present

## 2018-12-04 DIAGNOSIS — I482 Chronic atrial fibrillation, unspecified: Secondary | ICD-10-CM | POA: Diagnosis not present

## 2018-12-04 DIAGNOSIS — E1151 Type 2 diabetes mellitus with diabetic peripheral angiopathy without gangrene: Secondary | ICD-10-CM | POA: Diagnosis not present

## 2018-12-04 DIAGNOSIS — I10 Essential (primary) hypertension: Secondary | ICD-10-CM | POA: Diagnosis not present

## 2018-12-05 ENCOUNTER — Other Ambulatory Visit: Payer: Self-pay | Admitting: Gastroenterology

## 2018-12-05 DIAGNOSIS — R042 Hemoptysis: Secondary | ICD-10-CM | POA: Diagnosis not present

## 2018-12-05 DIAGNOSIS — K253 Acute gastric ulcer without hemorrhage or perforation: Secondary | ICD-10-CM | POA: Diagnosis not present

## 2018-12-11 ENCOUNTER — Ambulatory Visit (INDEPENDENT_AMBULATORY_CARE_PROVIDER_SITE_OTHER): Payer: Medicare HMO | Admitting: Cardiovascular Disease

## 2018-12-11 ENCOUNTER — Ambulatory Visit (INDEPENDENT_AMBULATORY_CARE_PROVIDER_SITE_OTHER): Payer: Medicare HMO | Admitting: Pharmacist Clinician (PhC)/ Clinical Pharmacy Specialist

## 2018-12-11 ENCOUNTER — Encounter: Payer: Self-pay | Admitting: Cardiovascular Disease

## 2018-12-11 VITALS — BP 162/50 | HR 62 | Ht 61.0 in | Wt 131.8 lb

## 2018-12-11 DIAGNOSIS — Z952 Presence of prosthetic heart valve: Secondary | ICD-10-CM | POA: Diagnosis not present

## 2018-12-11 DIAGNOSIS — I10 Essential (primary) hypertension: Secondary | ICD-10-CM | POA: Diagnosis not present

## 2018-12-11 DIAGNOSIS — I4891 Unspecified atrial fibrillation: Secondary | ICD-10-CM

## 2018-12-11 DIAGNOSIS — Z7901 Long term (current) use of anticoagulants: Secondary | ICD-10-CM

## 2018-12-11 DIAGNOSIS — I739 Peripheral vascular disease, unspecified: Secondary | ICD-10-CM

## 2018-12-11 DIAGNOSIS — I272 Pulmonary hypertension, unspecified: Secondary | ICD-10-CM

## 2018-12-11 DIAGNOSIS — I779 Disorder of arteries and arterioles, unspecified: Secondary | ICD-10-CM

## 2018-12-11 LAB — POCT INR: INR: 2.3 (ref 2.0–3.0)

## 2018-12-11 NOTE — Progress Notes (Signed)
12/11/2018 [name]   1941/02/13  [id number]  Primary Physician [name], [name], MD Primary Cardiologist: Lorretta Harp MD [name], [place]  HPI:  [name] is a 78 y.o.  mildly overweight, widowed Caucasian female mother of 28, grandmother to 35 grandchildren whose daughter unfortunately recently died of ALS after a long protracted course. I last saw her in the office  04/19/2018. .The patient has a history of CAD and valvular heart disease as well as PVOD. She is status post coronary artery bypass grafting x2 with a St. Jude AVR July 13, 2004. Her other problems include hypertension, hyperlipidemia, diabetes, and discontinued tobacco abuse. I dilated her left common iliac and redilated her for in-stent restenosis in 2006. We have been following her lower extremity and carotid Dopplers since that time. She does have mild to moderate right and mild left ICA stenosis which has remained stable. She is neurologically asymptomatic. She denies chest pain, shortness of breath or claudication. [name] follows her lipid profile which was done most recently in March revealing a total cholesterol of 106, LDL of 53 and HDL of 29. Her most recent carotid Doppler in June did show moderate right and moderately severe left ICA stenosis which had not changed, and her lower extremity Dopplers revealed ABIs of 1 bilaterally with bilateral iliac disease. Since I saw her last a year ago she's remained stable and is asymptomatic otherwise. She does give right total hip replacement which is scheduled to be performed in Dr. Jean Rosenthal next week. I'm going to get a 2-D echocardiogram to assess her aortic prosthesis as well as a pharmacologic Myoview stress test to stratify her symptoms been over a year since her last functional study and she is a diabetic of 10 years post bypass grafting.she did have a high-risk Myoview back in 2014 that she had a false positive Myoview back in 2006 after  her bypass surgery as well. She denies chest pain or shortness of breath. She ultimately never had her hip replacement.  Since I saw her a year ago she has had laparoscopic cholecystectomy with subsequent diastolic heart failure requiring diuresis.  She is also had GI bleeds requiring multiple blood transfusions.  Her echo performed 02/24/2018 revealed normal LV systolic function, well-functioning aortic mechanical processes with severe pulmonary hypertension and a PA pressure of 88 mmHg. I did refer her to Dr. Algernon Huxley for evaluation treatment of pulmonary hypertension.  He performed right heart cath 06/27/2018 revealing pulmonary pressures in the 60s with a PVR of 3.1 Wood units and only mildly elevated pulmonary capillary wedge pressure.  He is continuing to follow her for her pulmonary hypertension.  She was admitted to the hospital in January with GI bleed.  She was transfused 2 units of packed red blood cells and underwent endoscopy revealing some bleeding gastric ulcers.  Since transfusion she feels clinically improved.   Current Meds  Medication Sig  . acetaminophen (TYLENOL) 325 MG tablet Take 2 tablets (650 mg total) by mouth every 6 (six) hours as needed for mild pain, fever or headache (for pain or headaches). (Patient taking differently: Take 500 mg by mouth every 6 (six) hours as needed for mild pain, fever or headache (for pain or headaches). )  . ALPRAZolam (XANAX) 0.5 MG tablet Take 0.25 mg by mouth at bedtime.   [name] atorvastatin (LIPITOR) 80 MG tablet Take 1 tablet (80 mg total) by mouth daily at 6 PM.  . Cholecalciferol (VITAMIN D3) 1000  UNITS CAPS Take 1,000 Units by mouth daily. 12 noon  . COLCRYS 0.6 MG tablet Take 0.6 mg by mouth daily as needed (gout flare).   . ferrous sulfate 325 (65 FE) MG tablet Take 1 tablet (325 mg total) by mouth 2 (two) times daily with a meal.  . furosemide (LASIX) 40 MG tablet Take 80 mg by mouth daily.  . hydrALAZINE (APRESOLINE) 100 MG tablet Take 1  tablet (100 mg total) by mouth 3 (three) times daily.  . irbesartan (AVAPRO) 300 MG tablet TAKE 1 TABLET DAILY (PLEASE MAKE APPOINTMENT FOR REFILLS) (Patient taking differently: Take 300 mg by mouth daily. )  . magnesium oxide (MAG-OX) 400 (241.3 Mg) MG tablet Take 1 tablet (400 mg total) by mouth daily. (Patient taking differently: Take 400 mg by mouth daily. Take at noon)  . metFORMIN (GLUCOPHAGE) 500 MG tablet Take 500 mg by mouth 2 (two) times daily with a meal.   . ondansetron (ZOFRAN) 4 MG tablet Take 4 mg by mouth every 8 (eight) hours as needed for nausea or vomiting.   . pantoprazole (PROTONIX) 40 MG tablet Take 40 mg by mouth 2 (two) times daily.   Vladimir Faster Glyc-Propyl Glyc PF (SYSTANE PRESERVATIVE FREE) 0.4-0.3 % SOLN Place 1-2 drops into both eyes 3 (three) times daily as needed (for irritation).   [name] spironolactone (ALDACTONE) 25 MG tablet Take 0.5 tablets (12.5 mg total) by mouth daily.  . tadalafil (ADCIRCA/CIALIS) 20 MG tablet Take 1 tablet (20 mg total) by mouth 2 (two) times daily.  . vitamin B-12 (CYANOCOBALAMIN) 100 MCG tablet Take 100 mcg by mouth daily at 12 noon.   . warfarin (COUMADIN) 5 MG tablet Take 1-1.5 tablets (5-7.5 mg total) by mouth See admin instructions. Take 5 mg on Monday and Friday take in the morning Tale 7.5 mg all the other days     Allergies  Allergen Reactions  . Flagyl [Metronidazole] Rash    ALL-OVER BODY RASH  . Coreg [Carvedilol] Other (See Comments)    Terrible cramping in the feet and had a lot of bowel movements, but not diarrhea  . Losartan Swelling    Patient doesn't recall site of swelling  . Verapamil Hives  . Zetia [Ezetimibe] Other (See Comments)    Reaction not recalled  . Zocor [Simvastatin - High Dose] Other (See Comments)    Reaction not recalled    Social History   Socioeconomic History  . Marital status: Widowed    Spouse name: Not on file  . Number of children: Not on file  . Years of education: Not on file  .  Highest education level: Not on file  Occupational History  . Occupation: Retired  Scientific laboratory technician  . Financial resource strain: Not on file  . Food insecurity:    Worry: Not on file    Inability: Not on file  . Transportation needs:    Medical: Not on file    Non-medical: Not on file  Tobacco Use  . Smoking status: Former Smoker    Packs/day: 1.00    Years: 30.00    Pack years: 30.00    Types: Cigarettes  . Smokeless tobacco: Never Used  Substance and Sexual Activity  . Alcohol use: No    Alcohol/week: 0.0 standard drinks  . Drug use: No  . Sexual activity: Not on file  Lifestyle  . Physical activity:    Days per week: Not on file    Minutes per session: Not on file  . Stress:  Not on file  Relationships  . Social connections:    Talks on phone: Not on file    Gets together: Not on file    Attends religious service: Not on file    Active member of club or organization: Not on file    Attends meetings of clubs or organizations: Not on file    Relationship status: Not on file  . Intimate partner violence:    Fear of current or ex partner: Not on file    Emotionally abused: Not on file    Physically abused: Not on file    Forced sexual activity: Not on file  Other Topics Concern  . Not on file  Social History Narrative  . Not on file     Review of Systems: General: negative for chills, fever, night sweats or weight changes.  Cardiovascular: negative for chest pain, dyspnea on exertion, edema, orthopnea, palpitations, paroxysmal nocturnal dyspnea or shortness of breath Dermatological: negative for rash Respiratory: negative for cough or wheezing Urologic: negative for hematuria Abdominal: negative for nausea, vomiting, diarrhea, bright red blood per rectum, melena, or hematemesis Neurologic: negative for visual changes, syncope, or dizziness All other systems reviewed and are otherwise negative except as noted above.    Blood pressure (!) 162/50, pulse 62, height  5\' 1"  (1.549 m), weight 131 lb 12.8 oz (59.8 kg), SpO2 97 %.  General appearance: alert and no distress Neck: no adenopathy, no JVD, supple, symmetrical, trachea midline, thyroid not enlarged, symmetric, no tenderness/mass/nodules and Loud carotid bruits bilaterally Lungs: clear to auscultation bilaterally Heart: Crisp prosthetic mechanical heart valve Extremities: extremities normal, atraumatic, no cyanosis or edema Pulses: 2+ and symmetric Skin: Skin color, texture, turgor normal. No rashes or lesions Neurologic: Alert and oriented X 3, normal strength and tone. Normal symmetric reflexes. Normal coordination and gait  EKG not performed today  ASSESSMENT AND PLAN:   Hypertension History of essential hypertension her blood pressure measured today at 162/50.  She is on hydralazine, Avapro and Aldactone.  Hypercholesteremia History of hyperlipidemia on high-dose statin therapy with lipid profile performed 05/13/2018 revealing total cluster 104, LDL of 56 and HDL of 23.  Mechanical heart valve present History of Saint Jude AVR at the time of bypass surgery 07/13/2004.  Her last echo performed in May showed a well-functioning aortic mechanical prosthesis.  She is on Coumadin anticoagulation.  Coronary artery disease History of CAD status post CABG times 210/5/05.  She has had falls positive Myoview's in the past as recently as 2014.  She denies chest pain.  Carotid artery disease (HCC) History of carotid artery disease with recent Doppler performed 04/29/2018 revealing mild to moderate right and moderate to severe left ICA stenosis which has remained stable.  We will continue to follow her noninvasively by duplex ultrasound in July of this year.  Peripheral arterial disease ([place]) History of peripheral arterial disease status post left iliac stenting by myself 11/02 with recent Dopplers performed 04/29/2018 revealing a widely patent stent.  She denies claudication.  Atrial fibrillation  ([place]) History of PAF in the past with her last EKG performed 11/20/2018 revealing sinus rhythm.  Moderate to severe pulmonary hypertension (HCC) History of moderate to severe palmar hypertension with pulmonary systolic pressures in the 90s.  I did refer her to Dr. Aundra Dubin who performed right heart cath 06/27/2018 revealing a pulmonary artery systolic pressure of 63 with a peripheral vascular resistance of 3.1 Wood units.  Her mean wedge was 16 and her RA pressure was 5.  Lorretta Harp MD FACP,FACC,FAHA, [facility] 12/11/2018 11:30 AM

## 2018-12-11 NOTE — Assessment & Plan Note (Signed)
History of Saint Jude AVR at the time of bypass surgery 07/13/2004.  Her last echo performed in May showed a well-functioning aortic mechanical prosthesis.  She is on Coumadin anticoagulation.

## 2018-12-11 NOTE — Patient Instructions (Signed)
Medication Instructions:  Your physician recommends that you continue on your current medications as directed. Please refer to the Current Medication list given to you today.  If you need a refill on your cardiac medications before your next appointment, please call your pharmacy.   Lab work: NONE If you have labs (blood work) drawn today and your tests are completely normal, you will receive your results only by: [name] MyChart Message (if you have MyChart) OR . A paper copy in the mail If you have any lab test that is abnormal or we need to change your treatment, we will call you to review the results.  Testing/Procedures: Your physician has requested that you have an echocardiogram. Echocardiography is a painless test that uses sound waves to create images of your heart. It provides your doctor with information about the size and shape of your heart and how well your heart's chambers and valves are working. This procedure takes approximately one hour. There are no restrictions for this procedure. [place], [place], [place] [address]   Your physician has requested that you have a carotid duplex. This test is an ultrasound of the carotid arteries in your neck. It looks at blood flow through these arteries that supply the brain with blood. Allow one hour for this exam. There are no restrictions or special instructions. SCHEDULE July 2020   Follow-Up: At [facility], you and your health needs are our priority.  As part of our continuing mission to provide you with exceptional heart care, we have created designated Provider Care Teams.  These Care Teams include your primary Cardiologist (physician) and Advanced Practice Providers (APPs -  Physician Assistants and Nurse Practitioners) who all work together to provide you with the care you need, when you need it. . You will need a follow up appointment in 12 months.  Please call our office 2 months in advance to schedule this  appointment.  You may see Dr. [name] or one of the following Advanced Practice Providers on your designated Care Team:   . Kerin Ransom, [place] . Almyra Deforest, PA-C . Fabian Sharp, PA-C . Jory Sims, DNP . [name], PA-C . Roby Lofts, PA-C . Sande Rives, PA-C

## 2018-12-11 NOTE — Assessment & Plan Note (Signed)
History of hyperlipidemia on high-dose statin therapy with lipid profile performed 05/13/2018 revealing total cluster 104, LDL of 56 and HDL of 23.

## 2018-12-11 NOTE — Assessment & Plan Note (Signed)
History of CAD status post CABG times 210/5/05.  She has had falls positive Myoview's in the past as recently as 2014.  She denies chest pain.

## 2018-12-11 NOTE — Assessment & Plan Note (Signed)
History of peripheral arterial disease status post left iliac stenting by myself 11/02 with recent Dopplers performed 04/29/2018 revealing a widely patent stent.  She denies claudication.

## 2018-12-11 NOTE — Assessment & Plan Note (Signed)
History of carotid artery disease with recent Doppler performed 04/29/2018 revealing mild to moderate right and moderate to severe left ICA stenosis which has remained stable.  We will continue to follow her noninvasively by duplex ultrasound in July of this year.

## 2018-12-11 NOTE — Assessment & Plan Note (Signed)
History of PAF in the past with her last EKG performed 11/20/2018 revealing sinus rhythm.

## 2018-12-11 NOTE — Assessment & Plan Note (Signed)
History of essential hypertension her blood pressure measured today at 162/50.  She is on hydralazine, Avapro and Aldactone.

## 2018-12-11 NOTE — Assessment & Plan Note (Signed)
History of moderate to severe palmar hypertension with pulmonary systolic pressures in the 90s.  I did refer her to Dr. Aundra Dubin who performed right heart cath 06/27/2018 revealing a pulmonary artery systolic pressure of 63 with a peripheral vascular resistance of 3.1 Wood units.  Her mean wedge was 16 and her RA pressure was 5.

## 2018-12-12 ENCOUNTER — Ambulatory Visit
Admission: RE | Admit: 2018-12-12 | Discharge: 2018-12-12 | Disposition: A | Discharge status: Home / Self Care | Payer: Medicare HMO | Source: Ambulatory Visit | Attending: Gastroenterology | Admitting: Gastroenterology

## 2018-12-12 DIAGNOSIS — R918 Other nonspecific abnormal finding of lung field: Secondary | ICD-10-CM | POA: Diagnosis not present

## 2018-12-12 DIAGNOSIS — R042 Hemoptysis: Secondary | ICD-10-CM

## 2018-12-12 MED ORDER — IOPAMIDOL (ISOVUE-300) INJECTION 61%
50.0000 mL | Freq: Once | INTRAVENOUS | Status: AC | PRN
  Administered 2018-12-12: 50 mL via INTRAVENOUS

## 2018-12-23 ENCOUNTER — Other Ambulatory Visit (HOSPITAL_COMMUNITY): Payer: Medicare HMO

## 2018-12-27 ENCOUNTER — Other Ambulatory Visit: Payer: Self-pay

## 2018-12-27 NOTE — Patient Outreach (Signed)
[facility]) Care Management  12/27/2018   [name] [birth year] [id number]  Subjective: Successful outreach to the patient.  HIPAA verified.  The patient states that she is doing fair.  She denies any pain, shortness of breath, swelling or falls.  She states that she did not weigh herself this morning but she will.  Her weight yesterday was 130 lbs. She states that she is eating well and taking her medications.  She states that she had to have blood transfusion on 2/15 for bleeding ulcers.  She is doing well and her energy level has been fine.  She will go next month to recheck levels.  She states that she also has an appointment with Dr. Aundra Dubin in April.   Current Medications:  Current Outpatient Medications  Medication Sig Dispense Refill  . acetaminophen (TYLENOL) 325 MG tablet Take 2 tablets (650 mg total) by mouth every 6 (six) hours as needed for mild pain, fever or headache (for pain or headaches). (Patient taking differently: Take 500 mg by mouth every 6 (six) hours as needed for mild pain, fever or headache (for pain or headaches). )    . ALPRAZolam (XANAX) 0.5 MG tablet Take 0.25 mg by mouth at bedtime.     [name] atorvastatin (LIPITOR) 80 MG tablet Take 1 tablet (80 mg total) by mouth daily at 6 PM.    . Cholecalciferol (VITAMIN D3) 1000 UNITS CAPS Take 1,000 Units by mouth daily. 12 noon    . COLCRYS 0.6 MG tablet Take 0.6 mg by mouth daily as needed (gout flare).     . ferrous sulfate 325 (65 FE) MG tablet Take 1 tablet (325 mg total) by mouth 2 (two) times daily with a meal.    . furosemide (LASIX) 40 MG tablet Take 80 mg by mouth daily.    . hydrALAZINE (APRESOLINE) 100 MG tablet Take 1 tablet (100 mg total) by mouth 3 (three) times daily. 90 tablet 5  . irbesartan (AVAPRO) 300 MG tablet TAKE 1 TABLET DAILY (PLEASE MAKE APPOINTMENT FOR REFILLS) (Patient taking differently: Take 300 mg by mouth daily. ) 90 tablet 3  . magnesium oxide (MAG-OX) 400 (241.3 Mg) MG  tablet Take 1 tablet (400 mg total) by mouth daily. (Patient taking differently: Take 400 mg by mouth daily. Take at noon)    . ondansetron (ZOFRAN) 4 MG tablet Take 4 mg by mouth every 8 (eight) hours as needed for nausea or vomiting.     . pantoprazole (PROTONIX) 40 MG tablet Take 40 mg by mouth 2 (two) times daily.     Vladimir Faster Glyc-Propyl Glyc PF (SYSTANE PRESERVATIVE FREE) 0.4-0.3 % SOLN Place 1-2 drops into both eyes 3 (three) times daily as needed (for irritation).     . tadalafil (ADCIRCA/CIALIS) 20 MG tablet Take 1 tablet (20 mg total) by mouth 2 (two) times daily.    . vitamin B-12 (CYANOCOBALAMIN) 100 MCG tablet Take 100 mcg by mouth daily at 12 noon.     . warfarin (COUMADIN) 5 MG tablet Take 1-1.5 tablets (5-7.5 mg total) by mouth See admin instructions. Take 5 mg on Monday and Friday take in the morning Tale 7.5 mg all the other days    . metFORMIN (GLUCOPHAGE) 500 MG tablet Take 500 mg by mouth 2 (two) times daily with a meal.     . spironolactone (ALDACTONE) 25 MG tablet Take 0.5 tablets (12.5 mg total) by mouth daily. 15 tablet 5   No current facility-administered medications for this  visit.     Functional Status:  In your present state of health, do you have any difficulty performing the following activities: 11/20/2018 07/09/2018  Hearing? Y Y  Comment - -  Vision? N N  Difficulty concentrating or making decisions? N N  Walking or climbing stairs? N N  Dressing or bathing? N N  Doing errands, shopping? N [place] and eating ? - -  Using the Toilet? - -  In the past six months, have you accidently leaked urine? - -  Do you have problems with loss of bowel control? - -  Managing your Medications? - -  Managing your Finances? - -  Comment - -  Housekeeping or managing your Housekeeping? - -  Comment - -  Some recent data might be hidden    Fall/Depression Screening: Fall Risk  12/27/2018 10/22/2018 08/22/2018  Falls in the past year? 0 0 1   Comment - - -  Number falls in past yr: - - 0  Injury with Fall? - - 0  Risk for fall due to : - - -  Risk for fall due to: Comment - - -   PHQ 2/9 Scores 07/09/2018 06/13/2018 04/04/2018  PHQ - 2 Score 0 0 0    Assessment: Patient will continue to benefit from health coach outreach for disease management and support. [facility] CM Care Plan Problem One     Most Recent Value  [facility] Long Term Goal   In 60 days the patient will be more intereactive about diet and verbalize no admissions to the hospital  [facility] Long Term Goal Start Date  12/27/18    [facility] CM Care Plan Problem Two     Most Recent Value  Care Plan Problem Two  Will continue to educate the patient on signs and symptoms of CHF and what to do if symptomatic, encourage weighing and medications adherence , Advised patient to avoid people who are sick, washing hands with soap and water or using hand sanitizer.       Plan: RN Health Coach will contact patient in the month of May and patient agrees to next outreach.   Lazaro Arms RN, BSN, [place] Direct Dial:  [phone]  Fax: [phone]

## 2019-01-02 ENCOUNTER — Other Ambulatory Visit (HOSPITAL_COMMUNITY): Payer: Self-pay

## 2019-01-02 MED ORDER — TADALAFIL 20 MG PO TABS: 20.0000 mg | ORAL_TABLET | Freq: Two times a day (BID) | ORAL | 0 refills | Status: DC

## 2019-01-03 ENCOUNTER — Telehealth: Payer: Self-pay

## 2019-01-03 NOTE — Telephone Encounter (Signed)

## 2019-01-06 ENCOUNTER — Other Ambulatory Visit: Payer: Self-pay

## 2019-01-06 ENCOUNTER — Ambulatory Visit (INDEPENDENT_AMBULATORY_CARE_PROVIDER_SITE_OTHER): Payer: Medicare HMO | Admitting: Pharmacist

## 2019-01-06 DIAGNOSIS — I4891 Unspecified atrial fibrillation: Secondary | ICD-10-CM

## 2019-01-06 DIAGNOSIS — Z952 Presence of prosthetic heart valve: Secondary | ICD-10-CM

## 2019-01-06 DIAGNOSIS — Z7901 Long term (current) use of anticoagulants: Secondary | ICD-10-CM | POA: Diagnosis not present

## 2019-01-06 LAB — POCT INR: INR: 2.4 (ref 2.0–3.0)

## 2019-01-07 NOTE — Patient Instructions (Signed)
Description   Continue taking 1.5 tablets daily except 1 tablet on Mondays and Fridays.  Repeat INR in 6 weeks

## 2019-01-18 ENCOUNTER — Other Ambulatory Visit (HOSPITAL_COMMUNITY): Payer: Self-pay | Admitting: Cardiology

## 2019-01-27 ENCOUNTER — Telehealth (HOSPITAL_COMMUNITY): Payer: Medicare HMO | Admitting: Cardiology

## 2019-01-28 ENCOUNTER — Encounter: Payer: Self-pay | Admitting: Podiatry

## 2019-01-28 ENCOUNTER — Other Ambulatory Visit: Payer: Self-pay

## 2019-01-28 ENCOUNTER — Ambulatory Visit: Payer: Medicare HMO | Admitting: Podiatry

## 2019-01-28 VITALS — Temp 97.5°F

## 2019-01-28 DIAGNOSIS — M79674 Pain in right toe(s): Secondary | ICD-10-CM | POA: Diagnosis not present

## 2019-01-28 DIAGNOSIS — Z9229 Personal history of other drug therapy: Secondary | ICD-10-CM

## 2019-01-28 DIAGNOSIS — M79675 Pain in left toe(s): Secondary | ICD-10-CM

## 2019-01-28 DIAGNOSIS — E119 Type 2 diabetes mellitus without complications: Secondary | ICD-10-CM

## 2019-01-28 DIAGNOSIS — B351 Tinea unguium: Secondary | ICD-10-CM

## 2019-01-28 NOTE — Patient Instructions (Addendum)
STOP WEARING FLIP FLOPS DUE TO INSTABILITY OF SHOE.  PURCHASE GRASSHOPPERS OR SKECHERS SHOES FOR DAILY WEAR.    Diabetes Mellitus and Foot Care Foot care is an important part of your health, especially when you have diabetes. Diabetes may cause you to have problems because of poor blood flow (circulation) to your feet and legs, which can cause your skin to:  Become thinner and drier.  Break more easily.  Heal more slowly.  Peel and crack. You may also have nerve damage (neuropathy) in your legs and feet, causing decreased feeling in them. This means that you may not notice minor injuries to your feet that could lead to more serious problems. Noticing and addressing any potential problems early is the best way to prevent future foot problems. How to care for your feet Foot hygiene  Wash your feet daily with warm water and mild soap. Do not use hot water. Then, pat your feet and the areas between your toes until they are completely dry. Do not soak your feet as this can dry your skin.  Trim your toenails straight across. Do not dig under them or around the cuticle. File the edges of your nails with an emery board or nail file.  Apply a moisturizing lotion or petroleum jelly to the skin on your feet and to dry, brittle toenails. Use lotion that does not contain alcohol and is unscented. Do not apply lotion between your toes. Shoes and socks  Wear clean socks or stockings every day. Make sure they are not too tight. Do not wear knee-high stockings since they may decrease blood flow to your legs.  Wear shoes that fit properly and have enough cushioning. Always look in your shoes before you put them on to be sure there are no objects inside.  To break in new shoes, wear them for just a few hours a day. This prevents injuries on your feet. Wounds, scrapes, corns, and calluses  Check your feet daily for blisters, cuts, bruises, sores, and redness. If you cannot see the bottom of your  feet, use a mirror or ask someone for help.  Do not cut corns or calluses or try to remove them with medicine.  If you find a minor scrape, cut, or break in the skin on your feet, keep it and the skin around it clean and dry. You may clean these areas with mild soap and water. Do not clean the area with peroxide, alcohol, or iodine.  If you have a wound, scrape, corn, or callus on your foot, look at it several times a day to make sure it is healing and not infected. Check for: ? Redness, swelling, or pain. ? Fluid or blood. ? Warmth. ? Pus or a bad smell. General instructions  Do not cross your legs. This may decrease blood flow to your feet.  Do not use heating pads or hot water bottles on your feet. They may burn your skin. If you have lost feeling in your feet or legs, you may not know this is happening until it is too late.  Protect your feet from hot and cold by wearing shoes, such as at the beach or on hot pavement.  Schedule a complete foot exam at least once a year (annually) or more often if you have foot problems. If you have foot problems, report any cuts, sores, or bruises to your health care provider immediately. Contact a health care provider if:  You have a medical condition that increases your  risk of infection and you have any cuts, sores, or bruises on your feet.  You have an injury that is not healing.  You have redness on your legs or feet.  You feel burning or tingling in your legs or feet.  You have pain or cramps in your legs and feet.  Your legs or feet are numb.  Your feet always feel cold.  You have pain around a toenail. Get help right away if:  You have a wound, scrape, corn, or callus on your foot and: ? You have pain, swelling, or redness that gets worse. ? You have fluid or blood coming from the wound, scrape, corn, or callus. ? Your wound, scrape, corn, or callus feels warm to the touch. ? You have pus or a bad smell coming from the wound,  scrape, corn, or callus. ? You have a fever. ? You have a red line going up your leg. Summary  Check your feet every day for cuts, sores, red spots, swelling, and blisters.  Moisturize feet and legs daily.  Wear shoes that fit properly and have enough cushioning.  If you have foot problems, report any cuts, sores, or bruises to your health care provider immediately.  Schedule a complete foot exam at least once a year (annually) or more often if you have foot problems. This information is not intended to replace advice given to you by your health care provider. Make sure you discuss any questions you have with your health care provider. Document Released: 09/22/2000 Document Revised: 11/07/2017 Document Reviewed: 10/27/2016 Elsevier Interactive Patient Education  2019 [employer].

## 2019-01-28 NOTE — Progress Notes (Signed)
Subjective: [name] presents for preventative diabetic foot care today with painful, thick toenails 1-5 b/l that she cannot cut and which interfere with daily activities.  Pain is aggravated when wearing enclosed shoe gear.  She relates she had a fall this morning after getting tripped up while wearing a pair of flip flops in her kitchen. She denies any loss of consciousness. She denies hitting her head. She is not having any problems breathing. She was able to get up on her own.She states she has a sore rib and hip, but thinks that both are bruised. Her son brought her to her appointment today and he is aware of her fall.  [name], [name], MD is her PCP.   Current Outpatient Medications:  .  acetaminophen (TYLENOL) 325 MG tablet, Take 2 tablets (650 mg total) by mouth every 6 (six) hours as needed for mild pain, fever or headache (for pain or headaches). (Patient taking differently: Take 500 mg by mouth every 6 (six) hours as needed for mild pain, fever or headache (for pain or headaches). ), Disp: , Rfl:  .  ALPRAZolam (XANAX) 0.5 MG tablet, Take 0.25 mg by mouth at bedtime. , Disp: , Rfl:  .  atorvastatin (LIPITOR) 80 MG tablet, Take 1 tablet (80 mg total) by mouth daily at 6 PM., Disp: , Rfl:  .  Cholecalciferol (VITAMIN D3) 1000 UNITS CAPS, Take 1,000 Units by mouth daily. 12 noon, Disp: , Rfl:  .  COLCRYS 0.6 MG tablet, Take 0.6 mg by mouth daily as needed (gout flare). , Disp: , Rfl:  .  ferrous sulfate 325 (65 FE) MG tablet, Take 1 tablet (325 mg total) by mouth 2 (two) times daily with a meal., Disp: , Rfl:  .  furosemide (LASIX) 40 MG tablet, Take 80 mg by mouth daily., Disp: , Rfl:  .  hydrALAZINE (APRESOLINE) 100 MG tablet, TAKE 1 TABLET (100 MG TOTAL) BY MOUTH 3 (THREE) TIMES DAILY., Disp: 270 tablet, Rfl: 1 .  irbesartan (AVAPRO) 300 MG tablet, TAKE 1 TABLET DAILY (PLEASE MAKE APPOINTMENT FOR REFILLS) (Patient taking differently: Take 300 mg by mouth daily. ), Disp: 90  tablet, Rfl: 3 .  magnesium oxide (MAG-OX) 400 (241.3 Mg) MG tablet, Take 1 tablet (400 mg total) by mouth daily. (Patient taking differently: Take 400 mg by mouth daily. Take at noon), Disp: , Rfl:  .  metFORMIN (GLUCOPHAGE) 500 MG tablet, Take 500 mg by mouth 2 (two) times daily with a meal. , Disp: , Rfl:  .  ondansetron (ZOFRAN) 4 MG tablet, Take 4 mg by mouth every 8 (eight) hours as needed for nausea or vomiting. , Disp: , Rfl:  .  pantoprazole (PROTONIX) 40 MG tablet, Take 40 mg by mouth 2 (two) times daily. , Disp: , Rfl:  .  Polyethyl Glyc-Propyl Glyc PF (SYSTANE PRESERVATIVE FREE) 0.4-0.3 % SOLN, Place 1-2 drops into both eyes 3 (three) times daily as needed (for irritation). , Disp: , Rfl:  .  tadalafil (ADCIRCA/CIALIS) 20 MG tablet, Take 1 tablet (20 mg total) by mouth 2 (two) times daily., Disp: 180 tablet, Rfl: 0 .  vitamin B-12 (CYANOCOBALAMIN) 100 MCG tablet, Take 100 mcg by mouth daily at 12 noon. , Disp: , Rfl:  .  warfarin (COUMADIN) 5 MG tablet, Take 1-1.5 tablets (5-7.5 mg total) by mouth See admin instructions. Take 5 mg on Monday and Friday take in the morning Tale 7.5 mg all the other days, Disp: , Rfl:  .  spironolactone (  ALDACTONE) 25 MG tablet, Take 0.5 tablets (12.5 mg total) by mouth daily., Disp: 15 tablet, Rfl: 5  Allergies  Allergen Reactions  . Flagyl [Metronidazole] Rash    ALL-OVER BODY RASH  . Coreg [Carvedilol] Other (See Comments)    Terrible cramping in the feet and had a lot of bowel movements, but not diarrhea  . Losartan Swelling    Patient doesn't recall site of swelling  . Verapamil Hives  . Zetia [Ezetimibe] Other (See Comments)    Reaction not recalled  . Zocor [Simvastatin - High Dose] Other (See Comments)    Reaction not recalled    Objective:  Vascular Examination: Capillary refill time <3 seconds  x 10 digits  Dorsalis pedis and Posterior tibial pulses palpable b/l.  Digital hair absent x 10 digits.  Skin temperature gradient WNL  b/l.  Dermatological Examination: Skin with normal turgor, texture and tone b/l.  Toenails 1-5 b/l discolored, thick, dystrophic with subungual debris and pain with palpation to nailbeds due to thickness of nails.  Incurvated nailplate b/l great toes with tenderness to palpation. No erythema, no edema, no drainage noted.  Musculoskeletal: Muscle strength 5/5 to all LE muscle groups  No gross bony deformities b/l.  No pain, crepitus or joint limitation noted with ROM.   Neurological: Sensation intact with 10 gram monofilament.  Vibratory sensation intact.  Assessment: Painful onychomycosis toenails 1-5 b/l   Plan: 1. Toenails 1-5 b/l were debrided in length and girth without iatrogenic bleeding.Offending nail borders debrided and curretaged b/l great toes. Borders cleansed with alcohol. Antibiotic ointment applied. No further treatment required by patient. 2. Advised her to discontinue wearing flip flops immediately due to instability of the shoe. She refused diabetic shoes because they make her feet "hot". I recommended Grasshoppers or Skechers slip ons. She related understanding. 3. Patient to report any pedal injuries to medical professional immediately. 4. Follow up 9 weeks. 5. Patient/POA to call should there be a concern in the interim.

## 2019-02-03 ENCOUNTER — Ambulatory Visit (HOSPITAL_COMMUNITY)
Admission: RE | Admit: 2019-02-03 | Discharge: 2019-02-03 | Disposition: A | Discharge status: Home / Self Care | Payer: Medicare HMO | Source: Ambulatory Visit | Attending: Cardiology | Admitting: Cardiology

## 2019-02-03 ENCOUNTER — Other Ambulatory Visit: Payer: Self-pay

## 2019-02-03 DIAGNOSIS — I272 Pulmonary hypertension, unspecified: Secondary | ICD-10-CM | POA: Diagnosis not present

## 2019-02-03 DIAGNOSIS — I5032 Chronic diastolic (congestive) heart failure: Secondary | ICD-10-CM | POA: Diagnosis not present

## 2019-02-03 DIAGNOSIS — I482 Chronic atrial fibrillation, unspecified: Secondary | ICD-10-CM | POA: Diagnosis not present

## 2019-02-03 MED ORDER — SPIRONOLACTONE 25 MG PO TABS: 25.0000 mg | ORAL_TABLET | Freq: Every day | ORAL | 3 refills | Status: DC

## 2019-02-03 NOTE — Progress Notes (Signed)
Heart Failure TeleHealth Note  Due to national recommendations of social distancing due to [place] 19, Audio/video telehealth visit is felt to be most appropriate for this patient at this time.  See MyChart message from today for patient consent regarding telehealth for [facility].  Date:  02/03/2019   ID:  [name], DOB 09/17/1941, MRN [id number]  Location: Home  Provider location: [facility] Type of Visit: Established patient   PCP:  [name], [name], MD  Cardiologist:  Quay Burow, MD Primary HF: Dr. Aundra Dubin  Chief Complaint: Shortness of breath   History of Present Illness: [name] is a 78 y.o. female who presents via audio/video conferencing for a telehealth visit today.     she denies symptoms worrisome for COVID 19.   Patient has history of PAD, CAD s/p CABG, mechanical AVR, pulmonary hypertension, and chronic diastolic CHF with prominent RV dysfunction. Patient had CABG-AVR in 10/05.  She has been in chronic atrial fibrillation, it appears, since at least 4/17.  She has significant PAD followed by Dr. [name].    She was admitted in 5/19 with dyspnea and marked volume overloaded. She was started on Lasix and it was titrated up.  Echo showed EF 55-60% with moderate RV dilation, severe RV systolic dysfunction, and PASP 88 mmHg.  [place] in 9/19 showed moderate pulmonary hypertension with near normal filling pressures. PVR was not particularly high at 3.1 WU primarily due to elevated cardiac output. V/Q scan did not show chronic PEs and PFTs were unremarkable. She is on Adcirca.   She was admitted in 2/20 with upper GI bleeding, found to have gastric ulcers.  She had a CT chest in 3/20 with subtle bilateral ground glass opacities.  Referred to pulmonary.   No palpitations.  No dyspnea walking around her house.  Says it is hard to tell if [name] has helped her symptoms.  She is short of breath walking up stairs/inclines or longer walks on  flat ground. No BRBPR/melena.  No lightheadedness or syncope. She has pain in multiple joints.   ECG (personally reviewed): sinus brady at 48, 1st degree AVB 216 msec  Labs (8/19): K 4.2, creatinine 0.8, LDL 56 Labs (9/19): K 4.3, Na 129, creatinine 0.87, hgb 9.8 Labs (10/19): ANA+, RF 36.8 (elevated), SCL-70 negative, anti-centromere Ab negative, ds-DNA negative.  Labs (12/19): K 4, creatinine 1.09 Labs (1/20): CCP Ab elevated 115 Labs (2/20): K 4.1, creatinine 1.24  6 min walk (10/19): 189 m 6 min walk (1/20): 183 m  PMH: 1. Mechanical St Jude aortic valve 10/05: Goal INR 2.5-3.5.  2. CAD: s/p CABG x 2 with AVR in 10/05.  - Cardiolite in 2014 showed no ischemia.  3. HTN 4. Hyperlipidemia 5. Type 2 diabetes 6. PAD: Peripheral arterial dopplers (7/19) with >50% CIA stenosis on right, 70-99% CIA stenosis on the left.  7. H/o CCY 8. Atrial fibrillation: Chronic since at least 4/17. 9. Chronic diastolic CHF with prominent RV dysfunction: Echo (5/19) with EF 67-61%, grade 3 diastolic dysfunction, mechanical aortic valve with mean gradient 10 mmHg, mild MR, moderate RV dilation with moderate-severely decreased RV systolic function, biatrial enlargement, PASP 88 mmHg.  10. Carotid stenosis: 40-59% BICA stenosis on 7/19 carotid dopplers.  11. Pulmonary hypertension: RHC (9/19) with mean RA 5, PA 63/17 mean 36, mean PCWP 16, CI 4.16, PVR 3.1 WU.  - V/Q scan (10/19): no evidence for chronic PE.  - PFTs (10/19): minimal obstruction.  - ANA+, RF  36.8 (elevated), SCL-70 negative, anti-centromere Ab negative, ds-DNA negative, CCP Ab positive.  12. Suspect rheumatoid arthritis  Current Outpatient Medications  Medication Sig Dispense Refill  . acetaminophen (TYLENOL) 325 MG tablet Take 2 tablets (650 mg total) by mouth every 6 (six) hours as needed for mild pain, fever or headache (for pain or headaches). (Patient taking differently: Take 500 mg by mouth every 6 (six) hours as needed for mild  pain, fever or headache (for pain or headaches). )    . ALPRAZolam (XANAX) 0.5 MG tablet Take 0.25 mg by mouth at bedtime.     [name] atorvastatin (LIPITOR) 80 MG tablet Take 1 tablet (80 mg total) by mouth daily at 6 PM.    . Cholecalciferol (VITAMIN D3) 1000 UNITS CAPS Take 1,000 Units by mouth daily. 12 noon    . COLCRYS 0.6 MG tablet Take 0.6 mg by mouth daily as needed (gout flare).     . ferrous sulfate 325 (65 FE) MG tablet Take 1 tablet (325 mg total) by mouth 2 (two) times daily with a meal.    . furosemide (LASIX) 40 MG tablet Take 80 mg by mouth daily.    . hydrALAZINE (APRESOLINE) 100 MG tablet TAKE 1 TABLET (100 MG TOTAL) BY MOUTH 3 (THREE) TIMES DAILY. 270 tablet 1  . irbesartan (AVAPRO) 300 MG tablet TAKE 1 TABLET DAILY (PLEASE MAKE APPOINTMENT FOR REFILLS) (Patient taking differently: Take 300 mg by mouth daily. ) 90 tablet 3  . magnesium oxide (MAG-OX) 400 (241.3 Mg) MG tablet Take 1 tablet (400 mg total) by mouth daily. (Patient taking differently: Take 400 mg by mouth daily. Take at noon)    . metFORMIN (GLUCOPHAGE) 500 MG tablet Take 500 mg by mouth 2 (two) times daily with a meal.     . ondansetron (ZOFRAN) 4 MG tablet Take 4 mg by mouth every 8 (eight) hours as needed for nausea or vomiting.     . pantoprazole (PROTONIX) 40 MG tablet Take 40 mg by mouth 2 (two) times daily.     Vladimir Faster Glyc-Propyl Glyc PF (SYSTANE PRESERVATIVE FREE) 0.4-0.3 % SOLN Place 1-2 drops into both eyes 3 (three) times daily as needed (for irritation).     [name] spironolactone (ALDACTONE) 25 MG tablet Take 1 tablet (25 mg total) by mouth daily. 90 tablet 3  . tadalafil (ADCIRCA/CIALIS) 20 MG tablet Take 1 tablet (20 mg total) by mouth 2 (two) times daily. 180 tablet 0  . vitamin B-12 (CYANOCOBALAMIN) 100 MCG tablet Take 100 mcg by mouth daily at 12 noon.     . warfarin (COUMADIN) 5 MG tablet Take 1-1.5 tablets (5-7.5 mg total) by mouth See admin instructions. Take 5 mg on Monday and Friday take in the  morning Tale 7.5 mg all the other days     No current facility-administered medications for this encounter.     Allergies:   Flagyl [metronidazole]; Coreg [carvedilol]; Losartan; Verapamil; Zetia [ezetimibe]; and Zocor [simvastatin - high dose]   Social History:  The patient  reports that she has quit smoking. Her smoking use included cigarettes. She has a 30.00 pack-year smoking history. She has never used smokeless tobacco. She reports that she does not drink alcohol or use drugs.   Family History:  [name] family history is not on file.   ROS:  Please see the history of present illness.   All other systems are personally reviewed and negative.   Exam:  (Video/Tele Health Call; Exam is subjective and or/visual.) BP  162/54, HR 56, wt 130 lb (measured by patient) General:  Speaks in full sentences. No resp difficulty. Lungs: Normal respiratory effort with conversation.  Abdomen: Non-distended per patient report Extremities: Pt denies edema. Neuro: Alert & oriented x 3.   Recent Labs: 07/23/2018: B Natriuretic Peptide 220.2 11/21/2018: ALT 16 11/22/2018: BUN 30; Creatinine, Ser 1.24; Hemoglobin 9.5; Magnesium 1.8; Platelets 179; Potassium 4.1; Sodium 131  Personally reviewed   Wt Readings from Last 3 Encounters:  12/11/18 59.8 kg (131 lb 12.8 oz)  11/22/18 58.2 kg (128 lb 4.8 oz)  10/23/18 61 kg (134 lb 6.4 oz)     ASSESSMENT AND PLAN:  1. Atrial fibrillation: Chronic.  She has been on warfarin with mechanical aortic valve.  She has been in atrial fibrillation as far back as 4/17. Given the chronicity, I think that she is unlikely to successfully hold NSR long-term.   - Can continue low dose metoprolol but need to be careful with HR in 50s.  Would hold off on anti-arrhythmic meds.   - Continue warfarin, goal INR 2.5-3.5. No ASA with history of GI bleeding.  2. Chronic diastolic CHF: With prominent RV dysfunction and severe pulmonary hypertension by echo. Filling pressures  were well-compensated on 9/19 RHC. Weight and symptoms stable, NYHA class II-III.  - Continue Lasix 80 mg daily, send BMET.  3. CAD: No chest pain.   - Continue atorvastatin and warfarin.  - Check lipids.  4. PAD: Significant on 7/19 but no claudication, no pedal ulcerations. Repeat peripheral arterial dopplers in 7/20.  5. Mechanical aortic valve: Stable on 5/19 echo.  Given co-existing atrial fibrillation, goal INR is 2.5-3.5.  - Continue warfarin.  - No ASA with GI bleeding.  6. Pulmonary hypertension: Moderate pulmonary hypertension on 9/19 with RV dysfunction and dilation noted on last echo. PVR only 3.1 WU but cardiac output was high.  V/Q scan in 10/19 with no evidence for chronic PEs and PFTs in 10/19 were unremarkable.  I think there is a possible group 1 PH component though this is a borderline call as PVR is not significantly elevated. ANA and RF positive, as is CCP Ab.  Possible rheumatoid arthritis.  CT chest showed subtle bilateral ground glass opacities in 3/20, this could be due to RA lung effects.   - Continue Adcirca 40 mg daily.  It was borderline to start this, and I am not sure she has had marked benefit.  Will hold off on starting a 2nd [place] medication for now.  - She is due for a repeat echo.  If this suggests significant pulmonary hypertension with RV effect, would consider adding another agent for PH.  7. Suspect rheumatoid arthritis: Joint pain, positive RF and CCP Ab.  Lung findings (ground glass, pulmonary hypertension) could also be due to RA.   - Refer to rheumatology, Dr. Amil Amen.   COVID screen The patient does not have any symptoms that suggest any further testing/ screening at this time.  Social distancing reinforced today.  Patient Risk: After full review of this patients clinical status, I feel that they are at moderate risk for cardiac decompensation at this time.  Relevant cardiac medications were reviewed at length with the patient today. The patient does not  have concerns regarding their medications at this time.   Recommended follow-up:  2 months  Today, I have spent 22 minutes with the patient with telehealth technology discussing the above issues .    Signed, Loralie Champagne, MD  02/03/2019 11:35 PM  [facility]  [address] [place] [id number] [phone] (office) [phone] (fax)

## 2019-02-03 NOTE — Progress Notes (Signed)
Spoke to pt to review after visit summary. Pt verbalized understanding and denies questions at this time.

## 2019-02-03 NOTE — Patient Instructions (Signed)
Lab work will be done this week. We will contact you for any abnormal lab work.  You have been referred to Dr. Amil Amen for your rheumatoid arthritis management.  INCREASE Arlyce Harman to 25mg  daily  Your physician has requested that you have an echocardiogram. Echocardiography is a painless test that uses sound waves to create images of your heart. It provides your doctor with information about the size and shape of your heart and how well your heart's chambers and valves are working. This procedure takes approximately one hour. There are no restrictions for this procedure. This will be done with your follow up appointment in June with Dr. Aundra Dubin.  Please follow up with Dr. Aundra Dubin in 2 months with an echo cardiogram.

## 2019-02-06 ENCOUNTER — Other Ambulatory Visit: Payer: Self-pay

## 2019-02-06 ENCOUNTER — Ambulatory Visit (HOSPITAL_COMMUNITY)
Admission: RE | Admit: 2019-02-06 | Discharge: 2019-02-06 | Disposition: A | Discharge status: Home / Self Care | Payer: Medicare HMO | Source: Ambulatory Visit | Attending: Cardiology | Admitting: Cardiology

## 2019-02-06 DIAGNOSIS — I5032 Chronic diastolic (congestive) heart failure: Secondary | ICD-10-CM | POA: Insufficient documentation

## 2019-02-06 LAB — LIPID PANEL
Cholesterol: 89 mg/dL (ref 0–200)
HDL: 18 mg/dL — ABNORMAL LOW (ref >40)
LDL Cholesterol: 34 mg/dL (ref 0–99)
Total CHOL/HDL Ratio: 4.9 RATIO
Triglycerides: 184 mg/dL — ABNORMAL HIGH (ref <150)
VLDL: 37 mg/dL (ref 0–40)

## 2019-02-06 LAB — BASIC METABOLIC PANEL
Anion gap: 13 (ref 5–15)
BUN: 39 mg/dL — ABNORMAL HIGH (ref 8–23)
CO2: 21 mmol/L — ABNORMAL LOW (ref 22–32)
Calcium: 9.1 mg/dL (ref 8.9–10.3)
Chloride: 97 mmol/L — ABNORMAL LOW (ref 98–111)
Creatinine, Ser: 1.46 mg/dL — ABNORMAL HIGH (ref 0.44–1.00)
GFR calc Af Amer: 39 mL/min — ABNORMAL LOW (ref >60)
GFR calc non Af Amer: 34 mL/min — ABNORMAL LOW (ref >60)
Glucose, Bld: 169 mg/dL — ABNORMAL HIGH (ref 70–99)
Potassium: 4.5 mmol/L (ref 3.5–5.1)
Sodium: 131 mmol/L — ABNORMAL LOW (ref 135–145)

## 2019-02-06 LAB — CBC
HCT: 24.7 % — ABNORMAL LOW (ref 36.0–46.0)
Hemoglobin: 7.9 g/dL — ABNORMAL LOW (ref 12.0–15.0)
MCH: 29.8 pg (ref 26.0–34.0)
MCHC: 32 g/dL (ref 30.0–36.0)
MCV: 93.2 fL (ref 80.0–100.0)
Platelets: 192 10*3/uL (ref 150–400)
RBC: 2.65 MIL/uL — ABNORMAL LOW (ref 3.87–5.11)
RDW: 15.9 % — ABNORMAL HIGH (ref 11.5–15.5)
WBC: 4 10*3/uL (ref 4.0–10.5)
nRBC: 0 % (ref 0.0–0.2)

## 2019-02-06 LAB — SEDIMENTATION RATE: Sed Rate: 95 mm/hr — ABNORMAL HIGH (ref 0–22)

## 2019-02-06 LAB — C-REACTIVE PROTEIN: CRP: 1.1 mg/dL — ABNORMAL HIGH (ref <1.0)

## 2019-02-07 ENCOUNTER — Telehealth (HOSPITAL_COMMUNITY): Payer: Self-pay

## 2019-02-07 DIAGNOSIS — D649 Anemia, unspecified: Secondary | ICD-10-CM

## 2019-02-07 NOTE — Telephone Encounter (Signed)
Spoke with patient, she is aware of lab results.  Pt reporting feeling no symptoms of anemia.  Pt does not think she has blood in her stool.  She will return 1 week for repeat blood work.  She reports she has an appt with rheumatology this summer.

## 2019-02-07 NOTE — Telephone Encounter (Signed)
-----  Message from [name], MD sent at 02/07/2019  3:15 PM EDT ----- Lipids ok.  Creatinine stable.  ESR high, consistent with rheumatoid arthritis (make sure she has appt with rheumatology).  Hgb is low, has she had any blood in stool? Have her return next week for repeat CBC, also needs Fe/TIBC/ferritin drawn.

## 2019-02-10 ENCOUNTER — Telehealth (HOSPITAL_COMMUNITY): Payer: Self-pay

## 2019-02-10 NOTE — Telephone Encounter (Signed)
Pt called requesting to change appt date and time. Pt switched to 5/8 4pm.  Pt appreciative.

## 2019-02-13 ENCOUNTER — Other Ambulatory Visit: Payer: Self-pay

## 2019-02-13 ENCOUNTER — Other Ambulatory Visit (HOSPITAL_COMMUNITY): Payer: Medicare HMO

## 2019-02-13 ENCOUNTER — Ambulatory Visit (HOSPITAL_COMMUNITY)
Admission: RE | Admit: 2019-02-13 | Discharge: 2019-02-13 | Disposition: A | Discharge status: Home / Self Care | Payer: Medicare HMO | Source: Ambulatory Visit | Attending: Cardiology | Admitting: Cardiology

## 2019-02-13 DIAGNOSIS — D649 Anemia, unspecified: Secondary | ICD-10-CM | POA: Insufficient documentation

## 2019-02-13 LAB — CBC
HCT: 24 % — ABNORMAL LOW (ref 36.0–46.0)
Hemoglobin: 7.6 g/dL — ABNORMAL LOW (ref 12.0–15.0)
MCH: 30.2 pg (ref 26.0–34.0)
MCHC: 31.7 g/dL (ref 30.0–36.0)
MCV: 95.2 fL (ref 80.0–100.0)
Platelets: 181 10*3/uL (ref 150–400)
RBC: 2.52 MIL/uL — ABNORMAL LOW (ref 3.87–5.11)
RDW: 15.8 % — ABNORMAL HIGH (ref 11.5–15.5)
WBC: 3.8 10*3/uL — ABNORMAL LOW (ref 4.0–10.5)
nRBC: 0 % (ref 0.0–0.2)

## 2019-02-13 LAB — IRON AND TIBC
Iron: 56 ug/dL (ref 28–170)
Saturation Ratios: 14 % (ref 10.4–31.8)
TIBC: 389 ug/dL (ref 250–450)
UIBC: 333 ug/dL

## 2019-02-13 LAB — FERRITIN: Ferritin: 61 ng/mL (ref 11–307)

## 2019-02-14 ENCOUNTER — Other Ambulatory Visit (HOSPITAL_COMMUNITY): Payer: Self-pay

## 2019-02-14 ENCOUNTER — Telehealth: Payer: Self-pay

## 2019-02-14 ENCOUNTER — Telehealth (HOSPITAL_COMMUNITY): Payer: Self-pay

## 2019-02-14 DIAGNOSIS — D649 Anemia, unspecified: Secondary | ICD-10-CM

## 2019-02-14 NOTE — Telephone Encounter (Signed)
Relayed info to pt and placed referral to GI, pt aware and appreciative

## 2019-02-14 NOTE — Telephone Encounter (Signed)

## 2019-02-17 ENCOUNTER — Other Ambulatory Visit: Payer: Self-pay

## 2019-02-17 ENCOUNTER — Ambulatory Visit (INDEPENDENT_AMBULATORY_CARE_PROVIDER_SITE_OTHER): Payer: Medicare HMO | Admitting: Pharmacist

## 2019-02-17 DIAGNOSIS — Z7901 Long term (current) use of anticoagulants: Secondary | ICD-10-CM | POA: Diagnosis not present

## 2019-02-17 DIAGNOSIS — Z952 Presence of prosthetic heart valve: Secondary | ICD-10-CM

## 2019-02-17 DIAGNOSIS — I4891 Unspecified atrial fibrillation: Secondary | ICD-10-CM | POA: Diagnosis not present

## 2019-02-17 LAB — POCT INR: INR: 2.6 (ref 2.0–3.0)

## 2019-02-22 ENCOUNTER — Other Ambulatory Visit (HOSPITAL_COMMUNITY): Payer: Self-pay | Admitting: Cardiology

## 2019-02-24 ENCOUNTER — Inpatient Hospital Stay (HOSPITAL_COMMUNITY)
Admission: EM | Admit: 2019-02-24 | Discharge: 2019-03-05 | DRG: 291 | Disposition: A | Discharge status: Home / Self Care | Payer: Medicare HMO | Attending: Internal Medicine | Admitting: Internal Medicine

## 2019-02-24 ENCOUNTER — Encounter (HOSPITAL_COMMUNITY): Payer: Self-pay | Admitting: *Deleted

## 2019-02-24 ENCOUNTER — Other Ambulatory Visit: Payer: Self-pay

## 2019-02-24 DIAGNOSIS — K922 Gastrointestinal hemorrhage, unspecified: Secondary | ICD-10-CM | POA: Diagnosis not present

## 2019-02-24 DIAGNOSIS — Z881 Allergy status to other antibiotic agents status: Secondary | ICD-10-CM

## 2019-02-24 DIAGNOSIS — E1151 Type 2 diabetes mellitus with diabetic peripheral angiopathy without gangrene: Secondary | ICD-10-CM | POA: Diagnosis present

## 2019-02-24 DIAGNOSIS — I480 Paroxysmal atrial fibrillation: Secondary | ICD-10-CM | POA: Diagnosis present

## 2019-02-24 DIAGNOSIS — Z952 Presence of prosthetic heart valve: Secondary | ICD-10-CM

## 2019-02-24 DIAGNOSIS — E1121 Type 2 diabetes mellitus with diabetic nephropathy: Secondary | ICD-10-CM | POA: Diagnosis not present

## 2019-02-24 DIAGNOSIS — E119 Type 2 diabetes mellitus without complications: Secondary | ICD-10-CM

## 2019-02-24 DIAGNOSIS — Z87891 Personal history of nicotine dependence: Secondary | ICD-10-CM | POA: Diagnosis not present

## 2019-02-24 DIAGNOSIS — J189 Pneumonia, unspecified organism: Secondary | ICD-10-CM | POA: Diagnosis not present

## 2019-02-24 DIAGNOSIS — I130 Hypertensive heart and chronic kidney disease with heart failure and stage 1 through stage 4 chronic kidney disease, or unspecified chronic kidney disease: Secondary | ICD-10-CM | POA: Diagnosis present

## 2019-02-24 DIAGNOSIS — D649 Anemia, unspecified: Secondary | ICD-10-CM | POA: Diagnosis not present

## 2019-02-24 DIAGNOSIS — D509 Iron deficiency anemia, unspecified: Secondary | ICD-10-CM | POA: Diagnosis present

## 2019-02-24 DIAGNOSIS — R5383 Other fatigue: Secondary | ICD-10-CM | POA: Diagnosis present

## 2019-02-24 DIAGNOSIS — I272 Pulmonary hypertension, unspecified: Secondary | ICD-10-CM | POA: Diagnosis not present

## 2019-02-24 DIAGNOSIS — Z7901 Long term (current) use of anticoagulants: Secondary | ICD-10-CM

## 2019-02-24 DIAGNOSIS — I4891 Unspecified atrial fibrillation: Secondary | ICD-10-CM | POA: Diagnosis present

## 2019-02-24 DIAGNOSIS — Z8711 Personal history of peptic ulcer disease: Secondary | ICD-10-CM | POA: Diagnosis not present

## 2019-02-24 DIAGNOSIS — B961 Klebsiella pneumoniae [K. pneumoniae] as the cause of diseases classified elsewhere: Secondary | ICD-10-CM | POA: Diagnosis present

## 2019-02-24 DIAGNOSIS — I482 Chronic atrial fibrillation, unspecified: Secondary | ICD-10-CM | POA: Diagnosis present

## 2019-02-24 DIAGNOSIS — I2510 Atherosclerotic heart disease of native coronary artery without angina pectoris: Secondary | ICD-10-CM | POA: Diagnosis present

## 2019-02-24 DIAGNOSIS — Z1159 Encounter for screening for other viral diseases: Secondary | ICD-10-CM

## 2019-02-24 DIAGNOSIS — Z7984 Long term (current) use of oral hypoglycemic drugs: Secondary | ICD-10-CM

## 2019-02-24 DIAGNOSIS — Z888 Allergy status to other drugs, medicaments and biological substances status: Secondary | ICD-10-CM | POA: Diagnosis not present

## 2019-02-24 DIAGNOSIS — E785 Hyperlipidemia, unspecified: Secondary | ICD-10-CM | POA: Diagnosis present

## 2019-02-24 DIAGNOSIS — B9620 Unspecified Escherichia coli [E. coli] as the cause of diseases classified elsewhere: Secondary | ICD-10-CM | POA: Diagnosis present

## 2019-02-24 DIAGNOSIS — I5032 Chronic diastolic (congestive) heart failure: Secondary | ICD-10-CM | POA: Diagnosis present

## 2019-02-24 DIAGNOSIS — D500 Iron deficiency anemia secondary to blood loss (chronic): Secondary | ICD-10-CM | POA: Diagnosis not present

## 2019-02-24 DIAGNOSIS — D631 Anemia in chronic kidney disease: Secondary | ICD-10-CM | POA: Diagnosis present

## 2019-02-24 DIAGNOSIS — N390 Urinary tract infection, site not specified: Secondary | ICD-10-CM | POA: Diagnosis present

## 2019-02-24 DIAGNOSIS — Y95 Nosocomial condition: Secondary | ICD-10-CM | POA: Diagnosis not present

## 2019-02-24 DIAGNOSIS — D62 Acute posthemorrhagic anemia: Secondary | ICD-10-CM | POA: Diagnosis present

## 2019-02-24 DIAGNOSIS — Z79899 Other long term (current) drug therapy: Secondary | ICD-10-CM

## 2019-02-24 DIAGNOSIS — I2721 Secondary pulmonary arterial hypertension: Secondary | ICD-10-CM | POA: Diagnosis present

## 2019-02-24 DIAGNOSIS — N183 Chronic kidney disease, stage 3 unspecified: Secondary | ICD-10-CM | POA: Diagnosis present

## 2019-02-24 DIAGNOSIS — R509 Fever, unspecified: Secondary | ICD-10-CM

## 2019-02-24 DIAGNOSIS — Z951 Presence of aortocoronary bypass graft: Secondary | ICD-10-CM | POA: Diagnosis not present

## 2019-02-24 DIAGNOSIS — I10 Essential (primary) hypertension: Secondary | ICD-10-CM | POA: Diagnosis present

## 2019-02-24 DIAGNOSIS — K921 Melena: Secondary | ICD-10-CM | POA: Diagnosis not present

## 2019-02-24 DIAGNOSIS — J8410 Pulmonary fibrosis, unspecified: Secondary | ICD-10-CM | POA: Diagnosis not present

## 2019-02-24 DIAGNOSIS — E1122 Type 2 diabetes mellitus with diabetic chronic kidney disease: Secondary | ICD-10-CM | POA: Diagnosis present

## 2019-02-24 DIAGNOSIS — K449 Diaphragmatic hernia without obstruction or gangrene: Secondary | ICD-10-CM | POA: Diagnosis present

## 2019-02-24 DIAGNOSIS — Z03818 Encounter for observation for suspected exposure to other biological agents ruled out: Secondary | ICD-10-CM | POA: Diagnosis not present

## 2019-02-24 LAB — CBC WITH DIFFERENTIAL/PLATELET
Abs Immature Granulocytes: 0.03 10*3/uL (ref 0.00–0.07)
Basophils Absolute: 0.1 10*3/uL (ref 0.0–0.1)
Basophils Relative: 1 %
Eosinophils Absolute: 0.1 10*3/uL (ref 0.0–0.5)
Eosinophils Relative: 2 %
HCT: 22.4 % — ABNORMAL LOW (ref 36.0–46.0)
Hemoglobin: 7 g/dL — ABNORMAL LOW (ref 12.0–15.0)
Immature Granulocytes: 1 %
Lymphocytes Relative: 17 %
Lymphs Abs: 0.7 10*3/uL (ref 0.7–4.0)
MCH: 29.4 pg (ref 26.0–34.0)
MCHC: 31.3 g/dL (ref 30.0–36.0)
MCV: 94.1 fL (ref 80.0–100.0)
Monocytes Absolute: 0.3 10*3/uL (ref 0.1–1.0)
Monocytes Relative: 7 %
Neutro Abs: 2.9 10*3/uL (ref 1.7–7.7)
Neutrophils Relative %: 72 %
Platelets: 186 10*3/uL (ref 150–400)
RBC: 2.38 MIL/uL — ABNORMAL LOW (ref 3.87–5.11)
RDW: 15.2 % (ref 11.5–15.5)
WBC: 3.9 10*3/uL — ABNORMAL LOW (ref 4.0–10.5)
nRBC: 0 % (ref 0.0–0.2)

## 2019-02-24 LAB — BASIC METABOLIC PANEL
Anion gap: 14 (ref 5–15)
BUN: 50 mg/dL — ABNORMAL HIGH (ref 8–23)
CO2: 18 mmol/L — ABNORMAL LOW (ref 22–32)
Calcium: 9.2 mg/dL (ref 8.9–10.3)
Chloride: 102 mmol/L (ref 98–111)
Creatinine, Ser: 1.4 mg/dL — ABNORMAL HIGH (ref 0.44–1.00)
GFR calc Af Amer: 41 mL/min — ABNORMAL LOW (ref >60)
GFR calc non Af Amer: 35 mL/min — ABNORMAL LOW (ref >60)
Glucose, Bld: 117 mg/dL — ABNORMAL HIGH (ref 70–99)
Potassium: 3.9 mmol/L (ref 3.5–5.1)
Sodium: 134 mmol/L — ABNORMAL LOW (ref 135–145)

## 2019-02-24 LAB — TROPONIN I: Troponin I: <0.03 ng/mL (ref <0.03)

## 2019-02-24 LAB — POC OCCULT BLOOD, ED: Fecal Occult Bld: NEGATIVE

## 2019-02-24 LAB — PREPARE RBC (CROSSMATCH)

## 2019-02-24 LAB — PROTIME-INR
INR: 3.2 — ABNORMAL HIGH (ref 0.8–1.2)
Prothrombin Time: 32.6 seconds — ABNORMAL HIGH (ref 11.4–15.2)

## 2019-02-24 LAB — SARS CORONAVIRUS 2 BY RT PCR (HOSPITAL ORDER, PERFORMED IN ~~LOC~~ HOSPITAL LAB): SARS Coronavirus 2: NEGATIVE

## 2019-02-24 LAB — HEMOGLOBIN A1C
Hgb A1c MFr Bld: 4.6 % — ABNORMAL LOW (ref 4.8–5.6)
Mean Plasma Glucose: 85.32 mg/dL

## 2019-02-24 LAB — GLUCOSE, CAPILLARY
Glucose-Capillary: 100 mg/dL — ABNORMAL HIGH (ref 70–99)
Glucose-Capillary: 98 mg/dL (ref 70–99)

## 2019-02-24 MED ORDER — ACETAMINOPHEN 650 MG RE SUPP: 650.0000 mg | Freq: Four times a day (QID) | RECTAL | Status: DC | PRN

## 2019-02-24 MED ORDER — TADALAFIL 20 MG PO TABS
20.0000 mg | ORAL_TABLET | Freq: Two times a day (BID) | ORAL | Status: DC
  Administered 2019-02-25 – 2019-03-05 (×13): 20 mg via ORAL
  Filled 2019-02-24 (×20): qty 1

## 2019-02-24 MED ORDER — ALPRAZOLAM 0.25 MG PO TABS
0.2500 mg | ORAL_TABLET | Freq: Every day | ORAL | Status: DC
  Administered 2019-02-24 – 2019-03-04 (×8): 0.25 mg via ORAL
  Filled 2019-02-24 (×8): qty 1

## 2019-02-24 MED ORDER — MAGNESIUM OXIDE 400 (241.3 MG) MG PO TABS
400.0000 mg | ORAL_TABLET | Freq: Every evening | ORAL | Status: DC
  Administered 2019-02-24 – 2019-03-04 (×9): 400 mg via ORAL
  Filled 2019-02-24 (×9): qty 1

## 2019-02-24 MED ORDER — ACETAMINOPHEN 325 MG PO TABS
650.0000 mg | ORAL_TABLET | Freq: Four times a day (QID) | ORAL | Status: DC | PRN
  Administered 2019-02-25 – 2019-03-03 (×9): 650 mg via ORAL
  Filled 2019-02-24 (×9): qty 2

## 2019-02-24 MED ORDER — PANTOPRAZOLE SODIUM 40 MG IV SOLR
40.0000 mg | Freq: Once | INTRAVENOUS | Status: AC
  Administered 2019-02-24: 40 mg via INTRAVENOUS
  Filled 2019-02-24: qty 40

## 2019-02-24 MED ORDER — SODIUM CHLORIDE 0.9% FLUSH
3.0000 mL | Freq: Two times a day (BID) | INTRAVENOUS | Status: DC
  Administered 2019-02-24 – 2019-03-04 (×7): 3 mL via INTRAVENOUS

## 2019-02-24 MED ORDER — WARFARIN SODIUM 3 MG PO TABS
3.0000 mg | ORAL_TABLET | Freq: Once | ORAL | Status: AC
  Administered 2019-02-24: 19:00:00 3 mg via ORAL
  Filled 2019-02-24: qty 1

## 2019-02-24 MED ORDER — IRBESARTAN 300 MG PO TABS
300.0000 mg | ORAL_TABLET | Freq: Every day | ORAL | Status: DC
  Administered 2019-02-25 – 2019-03-05 (×9): 300 mg via ORAL
  Filled 2019-02-24 (×9): qty 1

## 2019-02-24 MED ORDER — ONDANSETRON HCL 4 MG PO TABS: 4.0000 mg | ORAL_TABLET | Freq: Four times a day (QID) | ORAL | Status: DC | PRN

## 2019-02-24 MED ORDER — ONDANSETRON HCL 4 MG/2ML IJ SOLN
4.0000 mg | Freq: Four times a day (QID) | INTRAMUSCULAR | Status: DC | PRN
  Administered 2019-02-28 – 2019-03-04 (×3): 4 mg via INTRAVENOUS
  Filled 2019-02-24 (×3): qty 2

## 2019-02-24 MED ORDER — PANTOPRAZOLE SODIUM 40 MG PO TBEC
40.0000 mg | DELAYED_RELEASE_TABLET | Freq: Two times a day (BID) | ORAL | Status: DC
  Administered 2019-02-25 – 2019-03-05 (×17): 40 mg via ORAL
  Filled 2019-02-24 (×17): qty 1

## 2019-02-24 MED ORDER — ATORVASTATIN CALCIUM 80 MG PO TABS
80.0000 mg | ORAL_TABLET | Freq: Every day | ORAL | Status: DC
  Administered 2019-02-24 – 2019-03-04 (×9): 80 mg via ORAL
  Filled 2019-02-24 (×9): qty 1

## 2019-02-24 MED ORDER — SODIUM CHLORIDE 0.9 % IV SOLN
10.0000 mL/h | Freq: Once | INTRAVENOUS | Status: AC
  Administered 2019-02-24: 10 mL/h via INTRAVENOUS

## 2019-02-24 MED ORDER — DIPHENHYDRAMINE HCL 50 MG/ML IJ SOLN
25.0000 mg | Freq: Once | INTRAMUSCULAR | Status: AC
  Administered 2019-02-24: 25 mg via INTRAVENOUS

## 2019-02-24 MED ORDER — HYDRALAZINE HCL 50 MG PO TABS
100.0000 mg | ORAL_TABLET | Freq: Three times a day (TID) | ORAL | Status: DC
  Administered 2019-02-25 – 2019-03-05 (×22): 100 mg via ORAL
  Filled 2019-02-24 (×26): qty 2

## 2019-02-24 MED ORDER — INSULIN ASPART 100 UNIT/ML ~~LOC~~ SOLN
0.0000 [IU] | Freq: Three times a day (TID) | SUBCUTANEOUS | Status: DC
  Administered 2019-02-25 (×2): 2 [IU] via SUBCUTANEOUS
  Administered 2019-02-26 (×2): 1 [IU] via SUBCUTANEOUS
  Administered 2019-02-26: 5 [IU] via SUBCUTANEOUS
  Administered 2019-02-27: 1 [IU] via SUBCUTANEOUS
  Administered 2019-02-28: 3 [IU] via SUBCUTANEOUS
  Administered 2019-02-28: 2 [IU] via SUBCUTANEOUS
  Administered 2019-03-01: 1 [IU] via SUBCUTANEOUS
  Administered 2019-03-01 – 2019-03-02 (×2): 2 [IU] via SUBCUTANEOUS
  Administered 2019-03-02: 1 [IU] via SUBCUTANEOUS
  Administered 2019-03-03 (×2): 2 [IU] via SUBCUTANEOUS
  Administered 2019-03-04: 1 [IU] via SUBCUTANEOUS
  Administered 2019-03-04 (×2): 2 [IU] via SUBCUTANEOUS
  Administered 2019-03-05: 1 [IU] via SUBCUTANEOUS

## 2019-02-24 MED ORDER — WARFARIN - PHARMACIST DOSING INPATIENT: Freq: Every day | Status: DC

## 2019-02-24 MED ORDER — DOCUSATE SODIUM 100 MG PO CAPS
100.0000 mg | ORAL_CAPSULE | Freq: Two times a day (BID) | ORAL | Status: DC
  Administered 2019-02-25 – 2019-03-04 (×12): 100 mg via ORAL
  Filled 2019-02-24 (×18): qty 1

## 2019-02-24 MED ORDER — DIPHENHYDRAMINE HCL 50 MG/ML IJ SOLN
INTRAMUSCULAR | Status: AC
  Filled 2019-02-24: qty 1

## 2019-02-24 NOTE — ED Notes (Addendum)
ED TO INPATIENT HANDOFF REPORT  ED Nurse Name and Phone #: Nigel Mormon [phone]  S Name/Age/Gender [name] 78 y.o. female Room/Bed: 027C/027C  Code Status   Code Status: Full Code  Home/SNF/Other Home Patient oriented to: self, place, time and situation Is this baseline? Yes   Triage Complete: Triage complete  Chief Complaint low hemoglobin, shob  Triage Note Pt arrived to PCP related to a low hgb, was told 6.8 from office, pt reports fatigue and weakness for the last week   Allergies Allergies  Allergen Reactions  . Flagyl [Metronidazole] Rash    ALL-OVER BODY RASH  . Coreg [Carvedilol] Other (See Comments)    Terrible cramping in the feet and had a lot of bowel movements, but not diarrhea  . Losartan Swelling    Patient doesn't recall site of swelling  . Verapamil Hives  . Zetia [Ezetimibe] Other (See Comments)    Reaction not recalled  . Zocor [Simvastatin - High Dose] Other (See Comments)    Reaction not recalled    Level of Care/Admitting Diagnosis ED Disposition    ED Disposition Condition [facility] Area: [place] [[id number]]  Level of Care: Telemetry Medical [104]  I expect the patient will be discharged within 24 hours: Yes  LOW acuity---Tx typically complete <24 hrs---ACUTE conditions typically can be evaluated <24 hours---LABS likely to return to acceptable levels <24 hours---IS near functional baseline---EXPECTED to return to current living arrangement---NOT newly hypoxic: Meets criteria for 5C-Observation unit  Covid Evaluation: Screening Protocol (No Symptoms)  Diagnosis: Symptomatic anemia [[id number]]  Admitting Physician: Karmen Bongo [[id number]]  Attending Physician: Karmen Bongo [[id number]]  PT Class (Do Not Modify): Observation [104]  PT Acc Code (Do Not Modify): Observation [10022]       B Medical/Surgery History Past Medical History:  Diagnosis Date  . Aortic atherosclerosis ([place]) 01/23/2018  .  Atrial fibrillation ([place])   . Carotid artery disease ([place])   . Cholelithiasis 01/23/2018  . Chronic anticoagulation   . Coronary artery disease    status post coronary artery bypass grafting times 07/10/2004  . Diabetes mellitus   . Hypercholesteremia   . Hypertension   . Mechanical heart valve present    H. aortic valve replacement at the time of bypass surgery October 2005  . Moderate to severe pulmonary hypertension ([place])   . Peripheral arterial disease ([facility])    history of left common iliac artery PTA and stenting for a chronic total occlusion 08/26/01   Past Surgical History:  Procedure Laterality Date  . AORTIC VALVE REPLACEMENT    . CARDIAC CATHETERIZATION  11/10/2004   40% right common illiac, 70% in stent restenosis of distal left common illiac,   . CARDIAC CATHETERIZATION  05/18/2004   LAD 50-70% midstenosis, RCA dominant w/50% stenosis, 50% Right common Illiac artery ostial stenosis, 90% in stent restenosis within midportion of left common illiac stent  . Carotid Duplex  03/12/2012   RSA-elev. velocities suggestive of a 50-69% diameter reduction, Right&Left Bulb/Prox ICA-mild-mod.fibrous plaqueelevating Velocities abnormal study.  . CHOLECYSTECTOMY N/A 03/01/2018   Procedure: LAPAROSCOPIC CHOLECYSTECTOMY;  Surgeon: Judeth Horn, MD;  Location: [facility];  Service: General;  Laterality: N/A;  . COLONOSCOPY WITH PROPOFOL N/A 01/22/2018   Procedure: COLONOSCOPY WITH PROPOFOL;  Surgeon: Wilford Corner, MD;  Location: [facility];  Service: Endoscopy;  Laterality: N/A;  . ESOPHAGOGASTRODUODENOSCOPY (EGD) WITH PROPOFOL N/A 01/22/2018   Procedure: ESOPHAGOGASTRODUODENOSCOPY (EGD) WITH PROPOFOL;  Surgeon: Wilford Corner, MD;  Location: [facility];  Service: Endoscopy;  Laterality: N/A;  . ESOPHAGOGASTRODUODENOSCOPY (EGD) WITH PROPOFOL N/A 11/21/2018   Procedure: ESOPHAGOGASTRODUODENOSCOPY (EGD) WITH PROPOFOL;  Surgeon: [name], MD;  Location: [facility];  Service:  Gastroenterology;  Laterality: N/A;  . GIVENS CAPSULE STUDY N/A 11/21/2018   Procedure: GIVENS CAPSULE STUDY;  Surgeon: [name], MD;  Location: [facility];  Service: Gastroenterology;  Laterality: N/A;  To be deployed during EGD  . Lower Ext. Duplex  03/12/2012   Right Proximal CIA- vessel narrowing w/elevated velocities 0-49% diameter reduction. Right SFA-mild mixed density plaque throughout vessel.  [name] NM MYOCAR PERF WALL MOTION  05/19/2010   protocol: Persantine, post stress EF 65%, negative for ischemia, low risk scan  . RIGHT HEART CATH N/A 06/27/2018   Procedure: RIGHT HEART CATH;  Surgeon: [name], MD;  Location: [facility];  Service: Cardiovascular;  Laterality: N/A;  . TRANSTHORACIC ECHOCARDIOGRAM  08/29/2012   Moderately calcified annulus of mitral valve, moderate regurg. of both mitral valve and tricuspid valve.      A IV Location/Drains/Wounds Patient Lines/Drains/Airways Status   Active Line/Drains/Airways    Name:   Placement date:   Placement time:   Site:   Days:   Peripheral IV 02/24/19 Left Forearm   02/24/19    1442    Forearm   less than 1          Intake/Output Last 24 hours No intake or output data in the 24 hours ending 02/24/19 1738  Labs/Imaging Results for orders placed or performed during the hospital encounter of 02/24/19 (from the past 48 hour(s))  CBC with Differential     Status: Abnormal   Collection Time: 02/24/19  2:27 PM  Result Value Ref Range   WBC 3.9 (L) 4.0 - 10.5 K/uL   RBC 2.38 (L) 3.87 - 5.11 MIL/uL   Hemoglobin 7.0 (L) 12.0 - 15.0 g/dL    Comment: REPEATED TO VERIFY   HCT 22.4 (L) 36.0 - 46.0 %   MCV 94.1 80.0 - 100.0 fL   MCH 29.4 26.0 - 34.0 pg   MCHC 31.3 30.0 - 36.0 g/dL   RDW 15.2 11.5 - 15.5 %   Platelets 186 150 - 400 K/uL   nRBC 0.0 0.0 - 0.2 %   Neutrophils Relative % 72 %   Neutro Abs 2.9 1.7 - 7.7 K/uL   Lymphocytes Relative 17 %   Lymphs Abs 0.7 0.7 - 4.0 K/uL   Monocytes Relative 7 %    Monocytes Absolute 0.3 0.1 - 1.0 K/uL   Eosinophils Relative 2 %   Eosinophils Absolute 0.1 0.0 - 0.5 K/uL   Basophils Relative 1 %   Basophils Absolute 0.1 0.0 - 0.1 K/uL   Immature Granulocytes 1 %   Abs Immature Granulocytes 0.03 0.00 - 0.07 K/uL    Comment: Performed at [facility], [address] [place],  [address]  Basic metabolic panel     Status: Abnormal   Collection Time: 02/24/19  2:27 PM  Result Value Ref Range   Sodium 134 (L) 135 - 145 mmol/L   Potassium 3.9 3.5 - 5.1 mmol/L   Chloride 102 98 - 111 mmol/L   CO2 18 (L) 22 - 32 mmol/L   Glucose, Bld 117 (H) 70 - 99 mg/dL   BUN 50 (H) 8 - 23 mg/dL   Creatinine, Ser 1.40 (H) 0.44 - 1.00 mg/dL   Calcium 9.2 8.9 - 10.3 mg/dL   GFR calc non Af Amer 35 (L) >  60 mL/min   GFR calc Af Amer 41 (L) >60 mL/min   Anion gap 14 5 - 15    Comment: Performed at [facility] [address], [place], [place] [address]  Type and screen     Status: None (Preliminary result)   Collection Time: 02/24/19  2:27 PM  Result Value Ref Range   ABO/RH(D) A POS    Antibody Screen NEG    Sample Expiration 02/27/2019,2359    Unit Number [id number]    Blood Component Type RED CELLS,LR    Unit division 00    Status of Unit ISSUED    Transfusion Status OK TO TRANSFUSE    Crossmatch Result      Compatible Performed at [facility], [place] [address] [place], [place] [address]   SARS Coronavirus 2 (CEPHEID - Performed in [facility] hospital lab), Hosp Order     Status: None   Collection Time: 02/24/19  2:27 PM  Result Value Ref Range   SARS Coronavirus 2 NEGATIVE NEGATIVE    Comment: (NOTE) If result is NEGATIVE SARS-CoV-2 target nucleic acids are NOT DETECTED. The SARS-CoV-2 RNA is generally detectable in upper and lower  respiratory specimens during the acute phase of infection. The lowest  concentration of SARS-CoV-2 viral copies this assay can detect is 250  copies / mL. A negative result does not preclude  SARS-CoV-2 infection  and should not be used as the sole basis for treatment or other  patient management decisions.  A negative result may occur with  improper specimen collection / handling, submission of specimen other  than nasopharyngeal swab, presence of viral mutation(s) within the  areas targeted by this assay, and inadequate number of viral copies  (<250 copies / mL). A negative result must be combined with clinical  observations, patient history, and epidemiological information. If result is POSITIVE SARS-CoV-2 target nucleic acids are DETECTED. The SARS-CoV-2 RNA is generally detectable in upper and lower  respiratory specimens dur ing the acute phase of infection.  Positive  results are indicative of active infection with SARS-CoV-2.  Clinical  correlation with patient history and other diagnostic information is  necessary to determine patient infection status.  Positive results do  not rule out bacterial infection or co-infection with other viruses. If result is PRESUMPTIVE POSTIVE SARS-CoV-2 nucleic acids MAY BE PRESENT.   A presumptive positive result was obtained on the submitted specimen  and confirmed on repeat testing.  While 2019 novel coronavirus  (SARS-CoV-2) nucleic acids may be present in the submitted sample  additional confirmatory testing may be necessary for epidemiological  and / or clinical management purposes  to differentiate between  SARS-CoV-2 and other Sarbecovirus currently known to infect humans.  If clinically indicated additional testing with an alternate test  methodology [phone]) is advised. The SARS-CoV-2 RNA is generally  detectable in upper and lower respiratory sp ecimens during the acute  phase of infection. The expected result is Negative. Fact Sheet for Patients:  StrictlyIdeas.no Fact Sheet for Healthcare Providers: BankingDealers.co.za This test is not yet approved or cleared by the  [place] FDA and has been authorized for detection and/or diagnosis of SARS-CoV-2 by FDA under an Emergency Use Authorization (EUA).  This EUA will remain in effect (meaning this test can be used) for the duration of the COVID-19 declaration under Section 564(b)(1) of the Act, 21 U.S.C. section 360bbb-3(b)(1), unless the authorization is terminated or revoked sooner. Performed at [facility], [place]  [address], [place], [place] [address]   Troponin I - ONCE - STAT     Status: None   Collection Time: 02/24/19  2:27 PM  Result Value Ref Range   Troponin I <0.03 <0.03 ng/mL    Comment: Performed at [facility], [place] [address] [place], [place] [address]  POC occult blood, ED Provider will collect     Status: None   Collection Time: 02/24/19  3:00 PM  Result Value Ref Range   Fecal Occult Bld NEGATIVE NEGATIVE  Protime-INR     Status: Abnormal   Collection Time: 02/24/19  4:09 PM  Result Value Ref Range   Prothrombin Time 32.6 (H) 11.4 - 15.2 seconds   INR 3.2 (H) 0.8 - 1.2    Comment: (NOTE) INR goal varies based on device and disease states. Performed at [facility], [place] [address], [place], [place] [address]   Prepare RBC     Status: None   Collection Time: 02/24/19  4:15 PM  Result Value Ref Range   Order Confirmation      ORDER PROCESSED BY BLOOD BANK Performed at [facility], [place] [address], [place], [place] [address]    No results found.  Pending Labs Unresulted Labs (From admission, onward)    Start     Ordered   02/25/19 5176  Basic metabolic panel  Tomorrow morning,   R     02/24/19 1730   02/25/19 0500  CBC  Tomorrow morning,   R     02/24/19 1730   02/24/19 1730  Hemoglobin A1c  Once,   R    Comments:  To assess prior glycemic control    02/24/19 1730          Vitals/Pain Today's Vitals   02/24/19 1608 02/24/19 1645 02/24/19 1700 02/24/19 1718  BP:  (!) 169/39 (!) 174/39 (!) 167/40  Pulse:  (!) 59 60 62  Resp:  18 19 17    Temp:    98.1 F (36.7 C)  TempSrc:    Oral  SpO2:  96% 96% 99%  PainSc: 4        Isolation Precautions No active isolations  Medications Medications  atorvastatin (LIPITOR) tablet 80 mg (has no administration in time range)  hydrALAZINE (APRESOLINE) tablet 100 mg (has no administration in time range)  irbesartan (AVAPRO) tablet 300 mg (has no administration in time range)  tadalafil (CIALIS) tablet 20 mg (has no administration in time range)  ALPRAZolam (XANAX) tablet 0.25 mg (has no administration in time range)  pantoprazole (PROTONIX) EC tablet 40 mg (has no administration in time range)  magnesium oxide (MAG-OX) tablet 400 mg (has no administration in time range)  acetaminophen (TYLENOL) tablet 650 mg (has no administration in time range)    Or  acetaminophen (TYLENOL) suppository 650 mg (has no administration in time range)  docusate sodium (COLACE) capsule 100 mg (has no administration in time range)  ondansetron (ZOFRAN) tablet 4 mg (has no administration in time range)    Or  ondansetron (ZOFRAN) injection 4 mg (has no administration in time range)  sodium chloride flush (NS) 0.9 % injection 3 mL (has no administration in time range)  insulin aspart (novoLOG) injection 0-9 Units (has no administration in time range)  pantoprazole (PROTONIX) injection 40 mg (40 mg Intravenous Given 02/24/19 1507)  0.9 %  sodium chloride infusion (10 mL/hr Intravenous New Bag/Given 02/24/19 1731)  diphenhydrAMINE (BENADRYL) injection 25 mg (25 mg Intravenous Given 02/24/19 1722)  Mobility walks High fall risk   Focused Assessments Cardiac Assessment Handoff:  Cardiac Rhythm: Normal sinus rhythm Lab Results  Component Value Date   TROPONINI <0.03 02/24/2019   No results found for: DDIMER Does the Patient currently have chest pain? No     R Recommendations: See Admitting Provider Note  Report given to: Jacqulyn Bath, RN   Additional Notes:

## 2019-02-24 NOTE — Progress Notes (Signed)
  Pt orientation to unit, room and routine. Information packet given to patient/family and safety video watched.  Admission INP armband ID verified with patient/family, and in place. SR up x 2, fall risk assessment complete with Patient and family verbalizing understanding of risks associated with falls. Pt verbalizes an understanding of how to use the call bell and to call for help before getting out of bed.  Skin, clean-dry- intact without evidence of bruising, or skin tears.   No evidence of skin break down noted on exam. Pt continues to received the one unit of blood started at the ED. No complaints of pain or discomfort when asked.   Will cont to monitor and assist as needed.  Hosie Spangle, [place] 02/24/2019

## 2019-02-24 NOTE — ED Triage Notes (Signed)
Pt arrived to PCP related to a low hgb, was told 6.8 from office, pt reports fatigue and weakness for the last week

## 2019-02-24 NOTE — H&P (Signed)
History and Physical    [name] YFV:[id number] DOB: Jul 03, 1941 DOA: 02/24/2019  PCP: [name], MD Consultants:  [facility]vel[facility] - GI; [place] - cardiology Patient coming from:  Home - lives alone; NOK: children, [name] [phone]  Chief Complaint: Fatigue  HPI: [name] is a 78 y.o. female with medical history significant of PAD s/p stenting; moderate to severe pulmonary HTN; afib and AVR on Coumadin; HTN; HLD; DM; and CAD s/p CABG presenting with fatigue. Her GI doctor called and told her her blood was 6.6 after having blood drawn this AM.  She was told to come today or tomorrow; her son was available to bring her and so she came today.  He said she needed blood.  Her hemoglobin has been going down for the last few days.  She has been feeling bad - just no energy, wanting to sleep more, SOB with exertion.  She takes iron and so they are always dark.    She was admitted in 2/20 with symptomatic anemia 2* to upper GI bleeding.  Hgb at that time was 7.2.  She received 2 units PRBC and was started on a Protonix drip.  She had an EGD with few minimally oozing superficial gastric ulcers and unremarkable capsule endoscopy.   ED Course:  H/o UGI bleed, on Coumadin.  Fatigue, Hgb 6-7 today.  Symptomatic anemia, will transfuse 1 unit.  Stool very dark, on iron, heme negative.  Review of Systems: As per HPI; otherwise review of systems reviewed and negative.   Ambulatory Status:  Ambulates without assistance  Past Medical History:  Diagnosis Date  . Aortic atherosclerosis ([place]) 01/23/2018  . Atrial fibrillation ([place])   . Carotid artery disease ([place])   . Cholelithiasis 01/23/2018  . Chronic anticoagulation   . Coronary artery disease    status post coronary artery bypass grafting times 07/10/2004  . Diabetes mellitus   . Hypercholesteremia   . Hypertension   . Mechanical heart valve present    H. aortic valve replacement at the time of bypass surgery October 2005  .  Moderate to severe pulmonary hypertension ([place])   . Peripheral arterial disease (H[facility])    history of left common iliac artery PTA and stenting for a chronic total occlusion 08/26/01    Past Surgical History:  Procedure Laterality Date  . AORTIC VALVE REPLACEMENT    . CARDIAC CATHETERIZATION  11/10/2004   40% right common illiac, 70% in stent restenosis of distal left common illiac,   . CARDIAC CATHETERIZATION  05/18/2004   LAD 50-70% midstenosis, RCA dominant w/50% stenosis, 50% Right common Illiac artery ostial stenosis, 90% in stent restenosis within midportion of left common illiac stent  . Carotid Duplex  03/12/2012   RSA-elev. velocities suggestive of a 50-69% diameter reduction, Right&Left Bulb/Prox ICA-mild-mod.fibrous plaqueelevating Velocities abnormal study.  . CHOLECYSTECTOMY N/A 03/01/2018   Procedure: LAPAROSCOPIC CHOLECYSTECTOMY;  Surgeon: Judeth Horn, MD;  Location: [facility] [facility];  Service: General;  Laterality: N/A;  . COLONOSCOPY WITH PROPOFOL N/A 01/22/2018   Procedure: COLONOSCOPY WITH PROPOFOL;  Surgeon: Wilford Corner, MD;  Location: [place];  Service: Endoscopy;  Laterality: N/A;  . ESOPHAGOGASTRODUODENOSCOPY (EGD) WITH PROPOFOL N/A 01/22/2018   Procedure: ESOPHAGOGASTRODUODENOSCOPY (EGD) WITH PROPOFOL;  Surgeon: Wilford Corner, MD;  Location: [facility] [facility];  Service: Endoscopy;  Laterality: N/A;  . ESOPHAGOGASTRODUODENOSCOPY (EGD) WITH PROPOFOL N/A 11/21/2018   Procedure: ESOPHAGOGASTRODUODENOSCOPY (EGD) WITH PROPOFOL;  Surgeon: [name], MD;  Location: [facility];  Service: Gastroenterology;  Laterality: N/A;  . GIVENS  CAPSULE STUDY N/A 11/21/2018   Procedure: GIVENS CAPSULE STUDY;  Surgeon: [name], MD;  Location: [facility];  Service: Gastroenterology;  Laterality: N/A;  To be deployed during EGD  . Lower Ext. Duplex  03/12/2012   Right Proximal CIA- vessel narrowing w/elevated velocities 0-49% diameter reduction. Right SFA-mild mixed density plaque  throughout vessel.  [name] NM MYOCAR PERF WALL MOTION  05/19/2010   protocol: Persantine, post stress EF 65%, negative for ischemia, low risk scan  . RIGHT HEART CATH N/A 06/27/2018   Procedure: RIGHT HEART CATH;  Surgeon: [name], MD;  Location: [facility];  Service: Cardiovascular;  Laterality: N/A;  . TRANSTHORACIC ECHOCARDIOGRAM  08/29/2012   Moderately calcified annulus of mitral valve, moderate regurg. of both mitral valve and tricuspid valve.     Social History   Socioeconomic History  . Marital status: Widowed    Spouse name: Not on file  . Number of children: Not on file  . Years of education: Not on file  . Highest education level: Not on file  Occupational History  . Occupation: Retired  Scientific laboratory technician  . Financial resource strain: Not on file  . Food insecurity:    Worry: Not on file    Inability: Not on file  . Transportation needs:    Medical: Not on file    Non-medical: Not on file  Tobacco Use  . Smoking status: Former Smoker    Packs/day: 1.00    Years: 30.00    Pack years: 30.00    Types: Cigarettes  . Smokeless tobacco: Never Used  Substance and Sexual Activity  . Alcohol use: No    Alcohol/week: 0.0 standard drinks  . Drug use: No  . Sexual activity: Not on file  Lifestyle  . Physical activity:    Days per week: Not on file    Minutes per session: Not on file  . Stress: Not on file  Relationships  . Social connections:    Talks on phone: Not on file    Gets together: Not on file    Attends religious service: Not on file    Active member of club or organization: Not on file    Attends meetings of clubs or organizations: Not on file    Relationship status: Not on file  . Intimate partner violence:    Fear of current or ex partner: Not on file    Emotionally abused: Not on file    Physically abused: Not on file    Forced sexual activity: Not on file  Other Topics Concern  . Not on file  Social History Narrative  . Not on file     Allergies  Allergen Reactions  . Flagyl [Metronidazole] Rash    ALL-OVER BODY RASH  . Coreg [Carvedilol] Other (See Comments)    Terrible cramping in the feet and had a lot of bowel movements, but not diarrhea  . Losartan Swelling    Patient doesn't recall site of swelling  . Verapamil Hives  . Zetia [Ezetimibe] Other (See Comments)    Reaction not recalled  . Zocor [Simvastatin - High Dose] Other (See Comments)    Reaction not recalled    Family History  Problem Relation Age of Onset  . Breast cancer Neg Hx     Prior to Admission medications   Medication Sig Start Date End Date Taking? Authorizing Provider  acetaminophen (TYLENOL) 325 MG tablet Take 2 tablets (650 mg total) by mouth every 6 (six) hours as  needed for mild pain, fever or headache (for pain or headaches). Patient taking differently: Take 500 mg by mouth every 6 (six) hours as needed for mild pain, fever or headache (for pain or headaches).  03/03/18  Yes Rayburn, Floyce Stakes, PA-C  ALPRAZolam (XANAX) 0.5 MG tablet Take 0.25 mg by mouth at bedtime.    Yes [provider]  atorvastatin (LIPITOR) 80 MG tablet Take 1 tablet (80 mg total) by mouth daily at 6 PM. 11/22/18  Yes [name], MD  Cholecalciferol (VITAMIN D3) 1000 UNITS CAPS Take 1,000 Units by mouth daily. 12 noon   Yes [provider]  COLCRYS 0.6 MG tablet Take 0.6 mg by mouth daily as needed (gout flare).  03/22/18  Yes [provider]  ferrous sulfate 325 (65 FE) MG tablet Take 1 tablet (325 mg total) by mouth 2 (two) times daily with a meal. 11/22/18  Yes Starla Link, Kshitiz, MD  furosemide (LASIX) 40 MG tablet Take 80 mg by mouth daily.   Yes [provider]  hydrALAZINE (APRESOLINE) 100 MG tablet TAKE 1 TABLET (100 MG TOTAL) BY MOUTH 3 (THREE) TIMES DAILY. 01/20/19  Yes [name], MD  irbesartan (AVAPRO) 300 MG tablet TAKE 1 TABLET DAILY (PLEASE MAKE APPOINTMENT FOR REFILLS) Patient taking differently: Take 300 mg by mouth  daily.  08/06/18  Yes Lorretta Harp, MD  magnesium oxide (MAG-OX) 400 (241.3 Mg) MG tablet Take 1 tablet (400 mg total) by mouth daily. Patient taking differently: Take 400 mg by mouth every evening.  01/30/18  Yes [name], MD  metFORMIN (GLUCOPHAGE) 500 MG tablet Take 500 mg by mouth 2 (two) times daily with a meal.    Yes [provider]  ondansetron (ZOFRAN) 4 MG tablet Take 4 mg by mouth every 8 (eight) hours as needed for nausea or vomiting.  03/22/18  Yes [provider]  pantoprazole (PROTONIX) 40 MG tablet Take 40 mg by mouth 2 (two) times daily.  12/05/14  Yes [provider]  spironolactone (ALDACTONE) 25 MG tablet Take 1 tablet (25 mg total) by mouth daily. 02/03/19 05/04/19 Yes [name], MD  tadalafil (ADCIRCA/CIALIS) 20 MG tablet Take 1 tablet (20 mg total) by mouth 2 (two) times daily. 01/02/19  Yes [name], MD  vitamin B-12 (CYANOCOBALAMIN) 100 MCG tablet Take 100 mcg by mouth daily at 12 noon.    Yes [provider]  warfarin (COUMADIN) 5 MG tablet Take 1-1.5 tablets (5-7.5 mg total) by mouth See admin instructions. Take 5 mg on Monday and Friday take in the morning Tale 7.5 mg all the other days 11/22/18  Yes [name], MD    Physical Exam: Vitals:   02/24/19 1600 02/24/19 1645 02/24/19 1700 02/24/19 1718  BP: (!) 181/43 (!) 169/39 (!) 174/39 (!) 153/62  Pulse: 60 (!) 59 60 62  Resp: (!) 28 18 19 17   Temp:    98.1 F (36.7 C)  TempSrc:    Oral  SpO2: 99% 96% 96% 99%     . General:  Appears calm and comfortable and is NAD; fatigued . Eyes:  PERRL, EOMI, normal lids, iris . ENT:  grossly normal hearing, lips & tongue, mmm; appropriate dentition . Neck:  no LAD, masses or thyromegaly . Cardiovascular:  RRR, no m/r/g. 1+ LE edema.  [name] Respiratory:   CTA bilaterally with no wheezes/rales/rhonchi.  Normal respiratory effort. . Abdomen:  soft, NT, ND, NABS . Skin:  no rash or induration seen on  limited exam  . Musculoskeletal:  grossly normal tone BUE/BLE, good ROM, no bony abnormality . Psychiatric:  grossly normal mood and affect, speech fluent and appropriate, AOx3 . Neurologic:  CN 2-12 grossly intact, moves all extremities in coordinated fashion, sensation intact    Radiological Exams on Admission: No results found.  EKG: Independently reviewed.  NSR with rate 63; nonspecific ST changes with no evidence of acute ischemia; NSCSLT   Labs on Admission: I have personally reviewed the available labs and imaging studies at the time of the admission.  Pertinent labs:   Glucose 117 BUN 50/Creatinine 1.40/GFR 35; stable Troponin <0.03 WBC 3.9 Hgb 7.0; 9.5 on 2/14; 7.9 on 4/30; 7.6 on 5/7 Normocytic with normal RDW Normal iron studies on 5/7 Heme negative INR 2.6 COVID negative  Assessment/Plan Principal Problem:   Symptomatic anemia Active Problems:   Chronic anticoagulation   Atrial fibrillation (HCC)   Chronic diastolic CHF (congestive heart failure) (HCC)   Diabetes mellitus type 2, controlled (HCC)   HTN (hypertension)   H/O mechanical aortic valve replacement   Moderate to severe pulmonary hypertension (HCC)   CKD (chronic kidney disease) stage 3, GFR 30-59 ml/min (HCC)   Symptomatic anemia -Patient with prior recent admission for symptomatic anemia in 2/20, thought to be related to minimally oozing superficial gastric ulcers -She has been followed by [facility] and was instructed to come in today due to progressive recurrent anemia -Patient reports fatigue and DOE which is c/w symptomatic anemia -Hgb down to 7 with consistent downward trend and normal MCV and RDW c/w normocytic anemia, likely either acute blood loss or anemia of chronic disease -Heme testing was negative in the ER, but due to concerns about recurrent intermittent bleeding will again consult GI; evaluation tomorrow AM is reasonable -Will observe overnight on telemetry -Transfuse 1 unit PRBC in accordance  with current COVID guidelines. -Will recheck CBC in AM and consider giving an additional unit at that time if still symptomatic, given her known h/o cardiac disease. -I have spoken with Dr. Paulita Fujita and the patient will be seen by then tomorrow.  Soft diet is okay.  Since she is on Coumadin and without obvious active bleeding, she is unlikely to require EGD based on currently available information.  Afib and mechanical valve, on Coumadin -Coumadin dosing per pharmacy -Despite GI bleeding, she continues to need anticoagulation; she is aware of the risks of bleeding with ongoing use of this medication. -Current INR is 3.2, which is therapeutic. -She does not appear to be on rate-controlling medication at this time, although she was on low-dose BB at 4/27 cardiology appt  Chronic diastolic CHF -7/78 echo with preserved EF and grade 3 diastolic dysfunction -She appears to be compensated at this time although she does have mild LE edema which may be associated with anemia -Will follow after transfusion -Hold diuretics for now, but it may be reasonable to resume these as soon as tomorrow  HTN -Continue hydralazine; Avapro -Hold Aldactone and Lasix for now  Pulmonary HTN -9/19 RHC with moderate pulmonary HTN -She was started on Adcirca but it is not clear how much benefit she is having and so a second medication was not started -She is due for repeat Echo and this has been scheduled for 7/7  Stage 3 CKD -Appears to be stable at this time -Hold diuretics but continue ARB for now  DM -Will check A1c -hold Glucophage -Cover with sensitive-scale SSI    Note: This patient has been tested and is  negative for the novel coronavirus COVID-19.  DVT prophylaxis: Coumadin Code Status:  Full - confirmed with patient Family Communication: None present; I spoke with her son by telephone Disposition Plan:  Home once clinically improved Consults called: GI  Admission status: It is my clinical  opinion that referral for OBSERVATION is reasonable and necessary in this patient based on the above information provided. The aforementioned taken together are felt to place the patient at high risk for further clinical deterioration. However it is anticipated that the patient may be medically stable for discharge from the hospital within 24 to 48 hours.     Karmen Bongo MD [facility]   How to contact the [facility] Attending or Consulting provider [place] or covering provider during after hours [place], for this patient?  1. Check the care team in [facility] and look for a) attending/consulting [facility] provider listed and b) the [facility] team listed 2. Log into [email or web address] and use [place]'s universal password to access. If you do not have the password, please contact the hospital operator. 3. Locate the [facility] provider you are looking for under [facility] and page to a number that you can be directly reached. 4. If you still have difficulty reaching the provider, please page the [facility] (Director on Call) for the Hospitalists listed on amion for assistance.   02/24/2019, 5:36 PM

## 2019-02-24 NOTE — ED Provider Notes (Signed)
Care assumed from Dr. Tyrone Nine.  At time of transfer care, patient is awaiting results of diagnostic laboratory testing prior to likely admission for symptomatic anemia.  Patient has of A. fib and mechanical valve replacement on Coumadin therapy as well as upper GI bleed in the past.  Patient reports she has had worsening fatigue recently and had her hemoglobin checked at PCP and it was below 7 in the 6 range.  Patient was sent to the emergency department for evaluation, blood transfusion, and admission.  Dr. Tyrone Nine reports that her stool was very dark but was fecal occult negative.  He suspects it was due to her chronic iron use and unsure if there is GI bleed that was not detected on the test.  Shortly after transfer of care, patient's hemoglobin came back at 7.0 this is down from prior.  Coronavirus test was negative and troponin was negative.  4:11 PM On my initial assessment of the patient, she does report that she is having the severe fatigue and is also developed exertional shortness of breath which she typically has when she is having symptomatic anemia.  Due to this change, she will be admitted.  Given patient's worsened symptoms and downtrending hemoglobin at 7.0, patient was given 1 unit of blood and admitted for symptomatic anemia.  CRITICAL CARE Performed by: Gwenyth Allegra Tegeler Total critical care time: 35 minutes Critical care time was exclusive of separately billable procedures and treating other patients. Symptomatic anemia requiring blood transfusion and admission. Critical care was necessary to treat or prevent imminent or life-threatening deterioration. Critical care was time spent personally by me on the following activities: development of treatment plan with patient and/or surrogate as well as nursing, discussions with consultants, evaluation of patient's response to treatment, examination of patient, obtaining history from patient or surrogate, ordering and performing  treatments and interventions, ordering and review of laboratory studies, ordering and review of radiographic studies, pulse oximetry and re-evaluation of patient's condition.    Clinical Impression: 1. Symptomatic anemia     Disposition: Admit  This note was prepared with assistance of Dragon voice recognition software. Occasional wrong-word or sound-a-like substitutions may have occurred due to the inherent limitations of voice recognition software.      Tegeler, Gwenyth Allegra, MD 02/24/19 2122

## 2019-02-24 NOTE — ED Notes (Signed)
Admitting MD at bedside.

## 2019-02-24 NOTE — ED Provider Notes (Signed)
[facility] Provider Note   CSN: [id number] Arrival date & time: 02/24/19  1337    History   Chief Complaint Chief Complaint  Patient presents with  . Abnormal Lab    HPI [name] HOEFER is a 78 y.o. female.     79 yoF with a chief complaint of generalized fatigue.  This been going on for the past few weeks.  She has had her hemoglobin checked every week and today it was down below 7.  She was then told by her physician to come to the emergency department for evaluation.  She was hospitalized about 3 months ago with similar and was found to have anemia that was thought secondary to upper GI bleeding.  She has had dark stool chronically as she is chronically on iron.  She denies any changes in her bowel habits.  Has had some abdominal pain off and on for many months as well that she thinks is unchanged.  Denies fevers or chills.  She denies cough or congestion denies exposure to the novel coronavirus.  The history is provided by the patient.  Abnormal Lab  Illness  Severity:  Moderate Onset quality:  Gradual Duration:  2 days Timing:  Constant Progression:  Worsening Chronicity:  New Associated symptoms: no chest pain, no congestion, no fever, no headaches, no myalgias, no nausea, no rhinorrhea, no shortness of breath, no vomiting and no wheezing     Past Medical History:  Diagnosis Date  . Aortic atherosclerosis ([place]) 01/23/2018  . Atrial fibrillation ([place])   . Carotid artery disease ([place])   . Cholelithiasis 01/23/2018  . Chronic anticoagulation   . Coronary artery disease    status post coronary artery bypass grafting times 07/10/2004  . Diabetes mellitus   . Hypercholesteremia   . Hypertension   . Mechanical heart valve present    H. aortic valve replacement at the time of bypass surgery October 2005  . Moderate to severe pulmonary hypertension ([place])   . Peripheral arterial disease ([facility])    history of left common iliac artery PTA  and stenting for a chronic total occlusion 08/26/01    Patient Active Problem List   Diagnosis Date Noted  . Malnutrition of moderate degree 03/12/2018  . Acute on chronic diastolic heart failure ([place]) 03/06/2018  . Diabetes mellitus type 2, controlled ([place]) 03/06/2018  . Chronic atrial fibrillation 03/06/2018  . S/P cholecystectomy 03/06/2018  . HTN (hypertension) 03/06/2018  . H/O mechanical aortic valve replacement 03/06/2018  . Acute hyponatremia 03/06/2018  . Acute respiratory failure with hypoxia ([place]) 03/06/2018  . Moderate to severe pulmonary hypertension ([place]) 03/06/2018  . UTI (urinary tract infection) 02/22/2018  . Cholecystitis 02/21/2018  . GERD (gastroesophageal reflux disease) 02/21/2018  . Anxiety 02/21/2018  . Chronic diastolic CHF (congestive heart failure) ([place]) 02/21/2018  . Diarrhea 02/21/2018  . Rash 02/21/2018  . General weakness 02/02/2018  . Macular rash 01/25/2018  . Thrombocytopenia ([place]) 01/25/2018  . Arm pain, diffuse 01/25/2018  . Symptomatic anemia 01/23/2018  . Cholelithiasis 01/23/2018  . Aortic atherosclerosis ([place]) 01/23/2018  . GI bleed 01/18/2018  . Atrial fibrillation ([place]) 01/12/2016  . Coronary artery disease 08/18/2013  . Carotid artery disease ([place]) 08/18/2013  . Peripheral arterial disease ([place]) 08/18/2013  . Diabetes mellitus without complication ([place]) 09/32/3557  . Benign positional vertigo 10/06/2011  . Hyponatremia 10/06/2011  . Influenza 10/06/2011  . Sinusitis 10/06/2011  . Hypertension   . Hypercholesteremia   . Mechanical heart valve present   .  Chronic anticoagulation     Past Surgical History:  Procedure Laterality Date  . AORTIC VALVE REPLACEMENT    . CARDIAC CATHETERIZATION  11/10/2004   40% right common illiac, 70% in stent restenosis of distal left common illiac,   . CARDIAC CATHETERIZATION  05/18/2004   LAD 50-70% midstenosis, RCA dominant w/50% stenosis, 50% Right common Illiac artery ostial stenosis, 90% in  stent restenosis within midportion of left common illiac stent  . Carotid Duplex  03/12/2012   RSA-elev. velocities suggestive of a 50-69% diameter reduction, Right&Left Bulb/Prox ICA-mild-mod.fibrous plaqueelevating Velocities abnormal study.  . CHOLECYSTECTOMY N/A 03/01/2018   Procedure: LAPAROSCOPIC CHOLECYSTECTOMY;  Surgeon: Judeth Horn, MD;  Location: [facility];  Service: General;  Laterality: N/A;  . COLONOSCOPY WITH PROPOFOL N/A 01/22/2018   Procedure: COLONOSCOPY WITH PROPOFOL;  Surgeon: Wilford Corner, MD;  Location: [facility];  Service: Endoscopy;  Laterality: N/A;  . ESOPHAGOGASTRODUODENOSCOPY (EGD) WITH PROPOFOL N/A 01/22/2018   Procedure: ESOPHAGOGASTRODUODENOSCOPY (EGD) WITH PROPOFOL;  Surgeon: Wilford Corner, MD;  Location: [facility];  Service: Endoscopy;  Laterality: N/A;  . ESOPHAGOGASTRODUODENOSCOPY (EGD) WITH PROPOFOL N/A 11/21/2018   Procedure: ESOPHAGOGASTRODUODENOSCOPY (EGD) WITH PROPOFOL;  Surgeon: [name], MD;  Location: [facility];  Service: Gastroenterology;  Laterality: N/A;  . GIVENS CAPSULE STUDY N/A 11/21/2018   Procedure: GIVENS CAPSULE STUDY;  Surgeon: [name], MD;  Location: [facility];  Service: Gastroenterology;  Laterality: N/A;  To be deployed during EGD  . Lower Ext. Duplex  03/12/2012   Right Proximal CIA- vessel narrowing w/elevated velocities 0-49% diameter reduction. Right SFA-mild mixed density plaque throughout vessel.  [name] NM MYOCAR PERF WALL MOTION  05/19/2010   protocol: Persantine, post stress EF 65%, negative for ischemia, low risk scan  . RIGHT HEART CATH N/A 06/27/2018   Procedure: RIGHT HEART CATH;  Surgeon: [name], MD;  Location: [facility];  Service: Cardiovascular;  Laterality: N/A;  . TRANSTHORACIC ECHOCARDIOGRAM  08/29/2012   Moderately calcified annulus of mitral valve, moderate regurg. of both mitral valve and tricuspid valve.      OB History   No obstetric history on file.      Home Medications     Prior to Admission medications   Medication Sig Start Date End Date Taking? Authorizing Provider  acetaminophen (TYLENOL) 325 MG tablet Take 2 tablets (650 mg total) by mouth every 6 (six) hours as needed for mild pain, fever or headache (for pain or headaches). Patient taking differently: Take 500 mg by mouth every 6 (six) hours as needed for mild pain, fever or headache (for pain or headaches).  03/03/18   Rayburn, Floyce Stakes, PA-C  ALPRAZolam Duanne Moron) 0.5 MG tablet Take 0.25 mg by mouth at bedtime.     [provider]  atorvastatin (LIPITOR) 80 MG tablet Take 1 tablet (80 mg total) by mouth daily at 6 PM. 11/22/18   [name], MD  Cholecalciferol (VITAMIN D3) 1000 UNITS CAPS Take 1,000 Units by mouth daily. 12 noon    [provider]  COLCRYS 0.6 MG tablet Take 0.6 mg by mouth daily as needed (gout flare).  03/22/18   [provider]  ferrous sulfate 325 (65 FE) MG tablet Take 1 tablet (325 mg total) by mouth 2 (two) times daily with a meal. 11/22/18   [name], MD  furosemide (LASIX) 40 MG tablet Take 80 mg by mouth daily.    [provider]  hydrALAZINE (APRESOLINE) 100 MG tablet TAKE 1 TABLET (100 MG TOTAL) BY MOUTH 3 (  THREE) TIMES DAILY. 01/20/19   [name], MD  irbesartan (AVAPRO) 300 MG tablet TAKE 1 TABLET DAILY (PLEASE MAKE APPOINTMENT FOR REFILLS) Patient taking differently: Take 300 mg by mouth daily.  08/06/18   Lorretta Harp, MD  magnesium oxide (MAG-OX) 400 (241.3 Mg) MG tablet Take 1 tablet (400 mg total) by mouth daily. Patient taking differently: Take 400 mg by mouth daily. Take at noon 01/30/18   [name], MD  metFORMIN (GLUCOPHAGE) 500 MG tablet Take 500 mg by mouth 2 (two) times daily with a meal.     [provider]  ondansetron (ZOFRAN) 4 MG tablet Take 4 mg by mouth every 8 (eight) hours as needed for nausea or vomiting.  03/22/18   [provider]  pantoprazole (PROTONIX) 40 MG tablet Take 40 mg  by mouth 2 (two) times daily.  12/05/14   [provider]  Polyethyl Glyc-Propyl Glyc PF (SYSTANE PRESERVATIVE FREE) 0.4-0.3 % SOLN Place 1-2 drops into both eyes 3 (three) times daily as needed (for irritation).     [provider]  spironolactone (ALDACTONE) 25 MG tablet Take 1 tablet (25 mg total) by mouth daily. 02/03/19 05/04/19  [name], MD  tadalafil (ADCIRCA/CIALIS) 20 MG tablet Take 1 tablet (20 mg total) by mouth 2 (two) times daily. 01/02/19   [name], MD  vitamin B-12 (CYANOCOBALAMIN) 100 MCG tablet Take 100 mcg by mouth daily at 12 noon.     [provider]  warfarin (COUMADIN) 5 MG tablet Take 1-1.5 tablets (5-7.5 mg total) by mouth See admin instructions. Take 5 mg on Monday and Friday take in the morning Tale 7.5 mg all the other days 11/22/18   [name], MD    Family History Family History  Problem Relation Age of Onset  . Breast cancer Neg Hx     Social History Social History   Tobacco Use  . Smoking status: Former Smoker    Packs/day: 1.00    Years: 30.00    Pack years: 30.00    Types: Cigarettes  . Smokeless tobacco: Never Used  Substance Use Topics  . Alcohol use: No    Alcohol/week: 0.0 standard drinks  . Drug use: No     Allergies   Flagyl [metronidazole]; Coreg [carvedilol]; Losartan; Verapamil; Zetia [ezetimibe]; and Zocor [simvastatin - high dose]   Review of Systems Review of Systems  Constitutional: Negative for chills and fever.  HENT: Negative for congestion and rhinorrhea.   Eyes: Negative for redness and visual disturbance.  Respiratory: Negative for shortness of breath and wheezing.   Cardiovascular: Negative for chest pain and palpitations.  Gastrointestinal: Positive for blood in stool. Negative for nausea and vomiting.  Genitourinary: Negative for dysuria and urgency.  Musculoskeletal: Negative for arthralgias and myalgias.  Skin: Negative for pallor and wound.  Neurological: Negative for  dizziness and headaches.     Physical Exam Updated Vital Signs BP (!) 166/34 (BP Location: Left Arm)   Pulse 70   Temp 98 F (36.7 C) (Oral)   Resp 16   SpO2 98%   Physical Exam Vitals signs and nursing note reviewed.  Constitutional:      General: She is not in acute distress.    Appearance: She is well-developed. She is not diaphoretic.     Comments: Pale  HENT:     Head: Normocephalic and atraumatic.  Eyes:     Pupils: Pupils are equal, round, and reactive to light.  Neck:  Musculoskeletal: Normal range of motion and neck supple.  Cardiovascular:     Rate and Rhythm: Normal rate and regular rhythm.     Heart sounds: No murmur. No friction rub. No gallop.   Pulmonary:     Effort: Pulmonary effort is normal.     Breath sounds: No wheezing or rales.  Abdominal:     General: There is no distension.     Palpations: Abdomen is soft.     Tenderness: There is no abdominal tenderness.  Genitourinary:    Comments: No appreciable hemorrhoids.  Charcoal colored stool.  No bright red blood. Musculoskeletal:        General: No tenderness.  Skin:    General: Skin is warm and dry.  Neurological:     Mental Status: She is alert and oriented to person, place, and time.  Psychiatric:        Behavior: Behavior normal.      ED Treatments / Results  Labs (all labs ordered are listed, but only abnormal results are displayed) Labs Reviewed  SARS CORONAVIRUS 2 (HOSPITAL ORDER, [facility])  CBC WITH DIFFERENTIAL/PLATELET  BASIC METABOLIC PANEL  TROPONIN I  POC OCCULT BLOOD, ED  TYPE AND SCREEN    EKG EKG Interpretation  Date/Time:  Monday Feb 24 2019 14:22:45 EDT Ventricular Rate:  63 PR Interval:    QRS Duration: 107 QT Interval:  419 QTC Calculation: 429 R Axis:   90 Text Interpretation:  Sinus rhythm Atrial premature complex Borderline right axis deviation No significant change since last tracing Confirmed by Deno Etienne [phone]) on  02/24/2019 2:26:53 PM Also confirmed by Deno Etienne [phone]), editor Hattie Perch ([id number])  on 02/24/2019 2:58:16 PM   Radiology No results found.  Procedures Procedures (including critical care time)  Medications Ordered in ED Medications  pantoprazole (PROTONIX) injection 40 mg (has no administration in time range)     Initial Impression / Assessment and Plan / ED Course  I have reviewed the triage vital signs and the nursing notes.  Pertinent labs & imaging results that were available during my care of the patient were reviewed by me and considered in my medical decision making (see chart for details).        78 yo F with a chief complaint of weakness.  Patient has a history of anemia that is required transfusion she thinks this feels the same.  Will obtain a laboratory evaluation.  Patient CARE signed out to Dr. Sherry Ruffing please see his note for further details care.  The patients results and plan were reviewed and discussed.   Any x-rays performed were independently reviewed by myself.   Differential diagnosis were considered with the presenting HPI.  Medications  pantoprazole (PROTONIX) injection 40 mg (has no administration in time range)    Vitals:   02/24/19 1341  BP: (!) 166/34  Pulse: 70  Resp: 16  Temp: 98 F (36.7 C)  TempSrc: Oral  SpO2: 98%    Final diagnoses:  Symptomatic anemia    Admission/ observation were discussed with the admitting physician, patient and/or family and they are comfortable with the plan.    Final Clinical Impressions(s) / ED Diagnoses   Final diagnoses:  Symptomatic anemia    ED Discharge Orders    None       Deno Etienne, DO 02/24/19 1458

## 2019-02-24 NOTE — Progress Notes (Signed)
ANTICOAGULATION CONSULT NOTE - Initial Consult  Pharmacy Consult for Warfarin  Indication: history of mechanical valve  Allergies  Allergen Reactions  . Flagyl [Metronidazole] Rash    ALL-OVER BODY RASH  . Coreg [Carvedilol] Other (See Comments)    Terrible cramping in the feet and had a lot of bowel movements, but not diarrhea  . Losartan Swelling    Patient doesn't recall site of swelling  . Verapamil Hives  . Zetia [Ezetimibe] Other (See Comments)    Reaction not recalled  . Zocor [Simvastatin - High Dose] Other (See Comments)    Reaction not recalled    Vital Signs: Temp: 98.3 F (36.8 C) (05/18 1809) Temp Source: Oral (05/18 1809) BP: 179/47 (05/18 1809) Pulse Rate: 63 (05/18 1809)  Labs: Recent Labs    02/24/19 1427 02/24/19 1609  HGB 7.0*  --   HCT 22.4*  --   PLT 186  --   LABPROT  --  32.6*  INR  --  3.2*  CREATININE 1.40*  --   TROPONINI <0.03  --     CrCl cannot be calculated (Unknown ideal weight.).   Medical History: Past Medical History:  Diagnosis Date  . Aortic atherosclerosis ([place]) 01/23/2018  . Atrial fibrillation ([place])   . Carotid artery disease ([place])   . Cholelithiasis 01/23/2018  . Chronic anticoagulation   . Coronary artery disease    status post coronary artery bypass grafting times 07/10/2004  . Diabetes mellitus   . Hypercholesteremia   . Hypertension   . Mechanical heart valve present    H. aortic valve replacement at the time of bypass surgery October 2005  . Moderate to severe pulmonary hypertension ([place])   . Peripheral arterial disease ([facility])    history of left common iliac artery PTA and stenting for a chronic total occlusion 08/26/01    Assessment: Pt is a 19 yoF with a mechanical aortic valve on warfarin PTA. Warfarin PTA: 7.5 mg daily except 5 mg M/F. INR on slightly above goal at 3.2. She has a history of GI bleed (most recently 2/20). Hb 7.0, fecal occult negative.    Goal of Therapy:  INR 2-3 Monitor platelets by  anticoagulation protocol: Yes   Plan:  Warfarin 3 mg x1 Daily INR and CBC Monitor for signs/symptoms of bleeding   Claiborne Billings, PharmD PGY2 Cardiology Pharmacy Resident Please check AMION for all Pharmacist numbers by unit 02/24/2019 6:16 PM

## 2019-02-25 ENCOUNTER — Other Ambulatory Visit: Payer: Self-pay

## 2019-02-25 DIAGNOSIS — I482 Chronic atrial fibrillation, unspecified: Secondary | ICD-10-CM | POA: Diagnosis present

## 2019-02-25 DIAGNOSIS — I2721 Secondary pulmonary arterial hypertension: Secondary | ICD-10-CM | POA: Diagnosis present

## 2019-02-25 DIAGNOSIS — B9620 Unspecified Escherichia coli [E. coli] as the cause of diseases classified elsewhere: Secondary | ICD-10-CM | POA: Diagnosis present

## 2019-02-25 DIAGNOSIS — I480 Paroxysmal atrial fibrillation: Secondary | ICD-10-CM | POA: Diagnosis present

## 2019-02-25 DIAGNOSIS — B961 Klebsiella pneumoniae [K. pneumoniae] as the cause of diseases classified elsewhere: Secondary | ICD-10-CM | POA: Diagnosis present

## 2019-02-25 DIAGNOSIS — I130 Hypertensive heart and chronic kidney disease with heart failure and stage 1 through stage 4 chronic kidney disease, or unspecified chronic kidney disease: Secondary | ICD-10-CM | POA: Diagnosis present

## 2019-02-25 DIAGNOSIS — Z951 Presence of aortocoronary bypass graft: Secondary | ICD-10-CM | POA: Diagnosis not present

## 2019-02-25 DIAGNOSIS — N390 Urinary tract infection, site not specified: Secondary | ICD-10-CM | POA: Diagnosis present

## 2019-02-25 DIAGNOSIS — I5032 Chronic diastolic (congestive) heart failure: Secondary | ICD-10-CM | POA: Diagnosis present

## 2019-02-25 DIAGNOSIS — N183 Chronic kidney disease, stage 3 (moderate): Secondary | ICD-10-CM | POA: Diagnosis present

## 2019-02-25 DIAGNOSIS — Z7901 Long term (current) use of anticoagulants: Secondary | ICD-10-CM | POA: Diagnosis not present

## 2019-02-25 DIAGNOSIS — D62 Acute posthemorrhagic anemia: Secondary | ICD-10-CM | POA: Diagnosis present

## 2019-02-25 DIAGNOSIS — Z87891 Personal history of nicotine dependence: Secondary | ICD-10-CM | POA: Diagnosis not present

## 2019-02-25 DIAGNOSIS — E785 Hyperlipidemia, unspecified: Secondary | ICD-10-CM | POA: Diagnosis present

## 2019-02-25 DIAGNOSIS — K449 Diaphragmatic hernia without obstruction or gangrene: Secondary | ICD-10-CM | POA: Diagnosis present

## 2019-02-25 DIAGNOSIS — Y95 Nosocomial condition: Secondary | ICD-10-CM | POA: Diagnosis not present

## 2019-02-25 DIAGNOSIS — D509 Iron deficiency anemia, unspecified: Secondary | ICD-10-CM | POA: Diagnosis present

## 2019-02-25 DIAGNOSIS — E1151 Type 2 diabetes mellitus with diabetic peripheral angiopathy without gangrene: Secondary | ICD-10-CM | POA: Diagnosis present

## 2019-02-25 DIAGNOSIS — Z888 Allergy status to other drugs, medicaments and biological substances status: Secondary | ICD-10-CM | POA: Diagnosis not present

## 2019-02-25 DIAGNOSIS — E1122 Type 2 diabetes mellitus with diabetic chronic kidney disease: Secondary | ICD-10-CM | POA: Diagnosis present

## 2019-02-25 DIAGNOSIS — R5383 Other fatigue: Secondary | ICD-10-CM | POA: Diagnosis present

## 2019-02-25 DIAGNOSIS — Z952 Presence of prosthetic heart valve: Secondary | ICD-10-CM | POA: Diagnosis not present

## 2019-02-25 DIAGNOSIS — D649 Anemia, unspecified: Secondary | ICD-10-CM | POA: Diagnosis not present

## 2019-02-25 DIAGNOSIS — Z1159 Encounter for screening for other viral diseases: Secondary | ICD-10-CM | POA: Diagnosis not present

## 2019-02-25 DIAGNOSIS — I2510 Atherosclerotic heart disease of native coronary artery without angina pectoris: Secondary | ICD-10-CM | POA: Diagnosis present

## 2019-02-25 DIAGNOSIS — D631 Anemia in chronic kidney disease: Secondary | ICD-10-CM | POA: Diagnosis present

## 2019-02-25 DIAGNOSIS — J189 Pneumonia, unspecified organism: Secondary | ICD-10-CM | POA: Diagnosis not present

## 2019-02-25 DIAGNOSIS — Z8711 Personal history of peptic ulcer disease: Secondary | ICD-10-CM | POA: Diagnosis not present

## 2019-02-25 LAB — BASIC METABOLIC PANEL
Anion gap: 8 (ref 5–15)
BUN: 45 mg/dL — ABNORMAL HIGH (ref 8–23)
CO2: 21 mmol/L — ABNORMAL LOW (ref 22–32)
Calcium: 8.9 mg/dL (ref 8.9–10.3)
Chloride: 105 mmol/L (ref 98–111)
Creatinine, Ser: 1.21 mg/dL — ABNORMAL HIGH (ref 0.44–1.00)
GFR calc Af Amer: 49 mL/min — ABNORMAL LOW (ref >60)
GFR calc non Af Amer: 42 mL/min — ABNORMAL LOW (ref >60)
Glucose, Bld: 105 mg/dL — ABNORMAL HIGH (ref 70–99)
Potassium: 3.9 mmol/L (ref 3.5–5.1)
Sodium: 134 mmol/L — ABNORMAL LOW (ref 135–145)

## 2019-02-25 LAB — GLUCOSE, CAPILLARY
Glucose-Capillary: 152 mg/dL — ABNORMAL HIGH (ref 70–99)
Glucose-Capillary: 114 mg/dL — ABNORMAL HIGH (ref 70–99)
Glucose-Capillary: 179 mg/dL — ABNORMAL HIGH (ref 70–99)
Glucose-Capillary: 153 mg/dL — ABNORMAL HIGH (ref 70–99)
Glucose-Capillary: 175 mg/dL — ABNORMAL HIGH (ref 70–99)

## 2019-02-25 LAB — CBC
HCT: 22.5 % — ABNORMAL LOW (ref 36.0–46.0)
Hemoglobin: 7.4 g/dL — ABNORMAL LOW (ref 12.0–15.0)
MCH: 29.1 pg (ref 26.0–34.0)
MCHC: 32.9 g/dL (ref 30.0–36.0)
MCV: 88.6 fL (ref 80.0–100.0)
Platelets: 162 10*3/uL (ref 150–400)
RBC: 2.54 MIL/uL — ABNORMAL LOW (ref 3.87–5.11)
RDW: 15.4 % (ref 11.5–15.5)
WBC: 3.3 10*3/uL — ABNORMAL LOW (ref 4.0–10.5)
nRBC: 0 % (ref 0.0–0.2)

## 2019-02-25 LAB — BPAM RBC
Blood Product Expiration Date: 202005262359
ISSUE DATE / TIME: 202005181714
Unit Type and Rh: 6200

## 2019-02-25 LAB — TYPE AND SCREEN
ABO/RH(D): A POS
Antibody Screen: NEGATIVE
Unit division: 0

## 2019-02-25 LAB — HEMOGLOBIN AND HEMATOCRIT, BLOOD
HCT: 24.7 % — ABNORMAL LOW (ref 36.0–46.0)
Hemoglobin: 8.2 g/dL — ABNORMAL LOW (ref 12.0–15.0)

## 2019-02-25 LAB — HEPATIC FUNCTION PANEL
ALT: 11 U/L (ref 0–44)
AST: 19 U/L (ref 15–41)
Albumin: 3 g/dL — ABNORMAL LOW (ref 3.5–5.0)
Alkaline Phosphatase: 69 U/L (ref 38–126)
Bilirubin, Direct: 0.2 mg/dL (ref 0.0–0.2)
Indirect Bilirubin: 1 mg/dL — ABNORMAL HIGH (ref 0.3–0.9)
Total Bilirubin: 1.2 mg/dL (ref 0.3–1.2)
Total Protein: 6.2 g/dL — ABNORMAL LOW (ref 6.5–8.1)

## 2019-02-25 LAB — PROTIME-INR
INR: 3.1 — ABNORMAL HIGH (ref 0.8–1.2)
Prothrombin Time: 31.3 seconds — ABNORMAL HIGH (ref 11.4–15.2)

## 2019-02-25 MED ORDER — FUROSEMIDE 80 MG PO TABS
80.0000 mg | ORAL_TABLET | Freq: Every day | ORAL | Status: DC
  Administered 2019-02-25 – 2019-03-05 (×9): 80 mg via ORAL
  Filled 2019-02-25 (×9): qty 1

## 2019-02-25 MED ORDER — WARFARIN SODIUM 2.5 MG PO TABS: 2.5000 mg | ORAL_TABLET | Freq: Once | ORAL | Status: DC

## 2019-02-25 MED ORDER — SODIUM CHLORIDE 0.9 % IV SOLN
INTRAVENOUS | Status: DC
  Administered 2019-02-25: 13:00:00 via INTRAVENOUS

## 2019-02-25 NOTE — Consult Note (Signed)
[facility] Gastroenterology Consultation Note  Referring Provider: Dr. Karmen Bongo Primary Care Physician:  [name], [name], MD Primary Gastroenterologist:  Dr.Parag Brahmbhatt  Reason for Consultation:  Anemia, dark stools  HPI: [name] is a 78 y.o. female admitted with symptomatic anemia.  For the past several weeks, she has had progressive troubles with weakness, shortness of breath, lethargy.  Some epigastric discomfort and some dark stools (but is on iron).  Take warfarin for mechanical heart valve replacement.  Has had prior troubles with anemia and dark stools; had endoscopy and colonoscopy April 2019 (unrevealing) and endoscopy and capsule endoscopy couple months ago (small gastric ulcers, non-bleeding), both in setting of melena and anemia and both unrevealing.  Patient's current stool hemoccult-negative.  No unintentional weight loss. Has received several units of blood over the past 1+ year for her recurrent anemia and melena.   Past Medical History:  Diagnosis Date  . Aortic atherosclerosis ([place]) 01/23/2018  . Atrial fibrillation ([place])   . Carotid artery disease ([place])   . Cholelithiasis 01/23/2018  . Chronic anticoagulation   . Coronary artery disease    status post coronary artery bypass grafting times 07/10/2004  . Diabetes mellitus   . Hypercholesteremia   . Hypertension   . Mechanical heart valve present    H. aortic valve replacement at the time of bypass surgery October 2005  . Moderate to severe pulmonary hypertension ([place])   . Peripheral arterial disease (HCC)    history of left common iliac artery PTA and stenting for a chronic total occlusion 08/26/01    Past Surgical History:  Procedure Laterality Date  . AORTIC VALVE REPLACEMENT    . CARDIAC CATHETERIZATION  11/10/2004   40% right common illiac, 70% in stent restenosis of distal left common illiac,   . CARDIAC CATHETERIZATION  05/18/2004   LAD 50-70% midstenosis, RCA dominant w/50% stenosis, 50% Right  common Illiac artery ostial stenosis, 90% in stent restenosis within midportion of left common illiac stent  . Carotid Duplex  03/12/2012   RSA-elev. velocities suggestive of a 50-69% diameter reduction, Right&Left Bulb/Prox ICA-mild-mod.fibrous plaqueelevating Velocities abnormal study.  . CHOLECYSTECTOMY N/A 03/01/2018   Procedure: LAPAROSCOPIC CHOLECYSTECTOMY;  Surgeon: Judeth Horn, MD;  Location: [facility];  Service: General;  Laterality: N/A;  . COLONOSCOPY WITH PROPOFOL N/A 01/22/2018   Procedure: COLONOSCOPY WITH PROPOFOL;  Surgeon: Wilford Corner, MD;  Location: [place];  Service: Endoscopy;  Laterality: N/A;  . ESOPHAGOGASTRODUODENOSCOPY (EGD) WITH PROPOFOL N/A 01/22/2018   Procedure: ESOPHAGOGASTRODUODENOSCOPY (EGD) WITH PROPOFOL;  Surgeon: Wilford Corner, MD;  Location: [place];  Service: Endoscopy;  Laterality: N/A;  . ESOPHAGOGASTRODUODENOSCOPY (EGD) WITH PROPOFOL N/A 11/21/2018   Procedure: ESOPHAGOGASTRODUODENOSCOPY (EGD) WITH PROPOFOL;  Surgeon: [name], MD;  Location: [place];  Service: Gastroenterology;  Laterality: N/A;  . GIVENS CAPSULE STUDY N/A 11/21/2018   Procedure: GIVENS CAPSULE STUDY;  Surgeon: [name], MD;  Location: [place];  Service: Gastroenterology;  Laterality: N/A;  To be deployed during EGD  . Lower Ext. Duplex  03/12/2012   Right Proximal CIA- vessel narrowing w/elevated velocities 0-49% diameter reduction. Right SFA-mild mixed density plaque throughout vessel.  [name] NM MYOCAR PERF WALL MOTION  05/19/2010   protocol: Persantine, post stress EF 65%, negative for ischemia, low risk scan  . RIGHT HEART CATH N/A 06/27/2018   Procedure: RIGHT HEART CATH;  Surgeon: [name], MD;  Location: [facility];  Service: Cardiovascular;  Laterality: N/A;  . TRANSTHORACIC ECHOCARDIOGRAM  08/29/2012   Moderately calcified  annulus of mitral valve, moderate regurg. of both mitral valve and tricuspid valve.     Prior to Admission medications    Medication Sig Start Date End Date Taking? Authorizing Provider  acetaminophen (TYLENOL) 325 MG tablet Take 2 tablets (650 mg total) by mouth every 6 (six) hours as needed for mild pain, fever or headache (for pain or headaches). Patient taking differently: Take 500 mg by mouth every 6 (six) hours as needed for mild pain, fever or headache (for pain or headaches).  03/03/18  Yes Rayburn, Floyce Stakes, PA-C  ALPRAZolam (XANAX) 0.5 MG tablet Take 0.25 mg by mouth at bedtime.    Yes [provider]  atorvastatin (LIPITOR) 80 MG tablet Take 1 tablet (80 mg total) by mouth daily at 6 PM. 11/22/18  Yes [name], MD  Cholecalciferol (VITAMIN D3) 1000 UNITS CAPS Take 1,000 Units by mouth daily. 12 noon   Yes [provider]  COLCRYS 0.6 MG tablet Take 0.6 mg by mouth daily as needed (gout flare).  03/22/18  Yes [provider]  ferrous sulfate 325 (65 FE) MG tablet Take 1 tablet (325 mg total) by mouth 2 (two) times daily with a meal. 11/22/18  Yes Starla Link, Kshitiz, MD  furosemide (LASIX) 40 MG tablet Take 80 mg by mouth daily.   Yes [provider]  hydrALAZINE (APRESOLINE) 100 MG tablet TAKE 1 TABLET (100 MG TOTAL) BY MOUTH 3 (THREE) TIMES DAILY. 01/20/19  Yes [name], MD  irbesartan (AVAPRO) 300 MG tablet TAKE 1 TABLET DAILY (PLEASE MAKE APPOINTMENT FOR REFILLS) Patient taking differently: Take 300 mg by mouth daily.  08/06/18  Yes Lorretta Harp, MD  magnesium oxide (MAG-OX) 400 (241.3 Mg) MG tablet Take 1 tablet (400 mg total) by mouth daily. Patient taking differently: Take 400 mg by mouth every evening.  01/30/18  Yes [name], MD  metFORMIN (GLUCOPHAGE) 500 MG tablet Take 500 mg by mouth 2 (two) times daily with a meal.    Yes [provider]  ondansetron (ZOFRAN) 4 MG tablet Take 4 mg by mouth every 8 (eight) hours as needed for nausea or vomiting.  03/22/18  Yes [provider]  pantoprazole (PROTONIX) 40 MG tablet Take 40 mg  by mouth 2 (two) times daily.  12/05/14  Yes [provider]  spironolactone (ALDACTONE) 25 MG tablet Take 1 tablet (25 mg total) by mouth daily. 02/03/19 05/04/19 Yes [name], MD  tadalafil (ADCIRCA/CIALIS) 20 MG tablet Take 1 tablet (20 mg total) by mouth 2 (two) times daily. 01/02/19  Yes [name], MD  vitamin B-12 (CYANOCOBALAMIN) 100 MCG tablet Take 100 mcg by mouth daily at 12 noon.    Yes [provider]  warfarin (COUMADIN) 5 MG tablet Take 1-1.5 tablets (5-7.5 mg total) by mouth See admin instructions. Take 5 mg on Monday and Friday take in the morning Tale 7.5 mg all the other days 11/22/18  Yes [name], MD    Current Facility-Administered Medications  Medication Dose Route Frequency Provider Last Rate Last Dose  . acetaminophen (TYLENOL) tablet 650 mg  650 mg Oral Q6H PRN Karmen Bongo, MD       Or  . acetaminophen (TYLENOL) suppository 650 mg  650 mg Rectal Q6H PRN Karmen Bongo, MD      . ALPRAZolam Duanne Moron) tablet 0.25 mg  0.25 mg Oral Ivery Quale, MD   0.25 mg at 02/24/19 2216  . atorvastatin (LIPITOR) tablet 80 mg  80 mg  Oral q1800 Karmen Bongo, MD   80 mg at 02/24/19 1828  . docusate sodium (COLACE) capsule 100 mg  100 mg Oral BID Karmen Bongo, MD   100 mg at 02/25/19 0946  . furosemide (LASIX) tablet 80 mg  80 mg Oral Daily Eulogio Bear U, DO   80 mg at 02/25/19 0947  . hydrALAZINE (APRESOLINE) tablet 100 mg  100 mg Oral TID Karmen Bongo, MD   100 mg at 02/25/19 0947  . insulin aspart (novoLOG) injection 0-9 Units  0-9 Units Subcutaneous TID WC Karmen Bongo, MD   2 Units at 02/25/19 [phone]  . irbesartan (AVAPRO) tablet 300 mg  300 mg Oral Daily Karmen Bongo, MD   300 mg at 02/25/19 0947  . magnesium oxide (MAG-OX) tablet 400 mg  400 mg Oral QPM Karmen Bongo, MD   400 mg at 02/24/19 1828  . ondansetron (ZOFRAN) tablet 4 mg  4 mg Oral Q6H PRN Karmen Bongo, MD       Or  . ondansetron [facility]) injection 4 mg  4  mg Intravenous Q6H PRN Karmen Bongo, MD      . pantoprazole (PROTONIX) EC tablet 40 mg  40 mg Oral BID Karmen Bongo, MD   40 mg at 02/25/19 0947  . sodium chloride flush (NS) 0.9 % injection 3 mL  3 mL Intravenous Q12H Karmen Bongo, MD   3 mL at 02/25/19 0952  . tadalafil (CIALIS) tablet 20 mg  20 mg Oral BID Karmen Bongo, MD   20 mg at 02/25/19 0948  . warfarin (COUMADIN) tablet 2.5 mg  2.5 mg Oral ONCE-1800 Rolla Flatten, [facility]      . Warfarin - Pharmacist Dosing Inpatient   Does not apply [id number] Karmen Bongo, MD        Allergies as of 02/24/2019 - Review Complete 02/24/2019  Allergen Reaction Noted  . Flagyl [metronidazole] Rash 02/21/2018  . Coreg [carvedilol] Other (See Comments) 01/18/2018  . Losartan Swelling 08/22/2013  . Verapamil Hives 08/22/2013  . Zetia [ezetimibe] Other (See Comments) 10/06/2011  . Zocor [simvastatin - high dose] Other (See Comments) 10/06/2011    Family History  Problem Relation Age of Onset  . Breast cancer Neg Hx     Social History   Socioeconomic History  . Marital status: Widowed    Spouse name: Not on file  . Number of children: Not on file  . Years of education: Not on file  . Highest education level: Not on file  Occupational History  . Occupation: Retired  Scientific laboratory technician  . Financial resource strain: Not on file  . Food insecurity:    Worry: Not on file    Inability: Not on file  . Transportation needs:    Medical: Not on file    Non-medical: Not on file  Tobacco Use  . Smoking status: Former Smoker    Packs/day: 1.00    Years: 30.00    Pack years: 30.00    Types: Cigarettes  . Smokeless tobacco: Never Used  Substance and Sexual Activity  . Alcohol use: No    Alcohol/week: 0.0 standard drinks  . Drug use: No  . Sexual activity: Not on file  Lifestyle  . Physical activity:    Days per week: Not on file    Minutes per session: Not on file  . Stress: Not on file  Relationships  . Social connections:     Talks on phone: Not on file    Gets together: Not on file  Attends religious service: Not on file    Active member of club or organization: Not on file    Attends meetings of clubs or organizations: Not on file    Relationship status: Not on file  . Intimate partner violence:    Fear of current or ex partner: Not on file    Emotionally abused: Not on file    Physically abused: Not on file    Forced sexual activity: Not on file  Other Topics Concern  . Not on file  Social History Narrative  . Not on file    Review of Systems: As per HPI, all others negative  Physical Exam: Vital signs in last 24 hours: Temp:  [97.8 F (36.6 C)-98.4 F (36.9 C)] 98.4 F (36.9 C) (05/19 0622) Pulse Rate:  [59-75] 63 (05/19 0944) Resp:  [15-28] 18 (05/19 0944) BP: (157-184)/(33-47) 167/45 (05/19 0946) SpO2:  [96 %-100 %] 98 % (05/19 0944) Last BM Date: 02/25/19 General:   Alert,  Well-developed, well-nourished, pleasant and cooperative in NAD Head:  Normocephalic and atraumatic. Eyes:  Sclera clear, no icterus.   Conjunctiva pale Ears:  Normal auditory acuity. Nose:  No deformity, discharge,  or lesions. Mouth:  No deformity or lesions.  Oropharynx pale and somewhat dry Neck:  Supple; no masses or thyromegaly. Lungs:  Clear throughout to auscultation.   No wheezes, crackles, or rhonchi. No acute distress. Heart:  Regular rate and rhythm; audible heart click, flow murmur Abdomen:  Soft, nontender and nondistended. No masses, hepatosplenomegaly or hernias noted. Normal bowel sounds, without guarding, and without rebound.     Msk:  Symmetrical without gross deformities. Normal posture. Pulses:  Normal pulses noted. Extremities:  Without clubbing or edema. Neurologic:  Alert and  oriented x4;  grossly normal neurologically. Skin:  Scattered ecchymoses and telangiectasias, otherwise Intact without significant lesions or rashes. Psych:  Alert and cooperative. Normal mood and affect.   Lab  Results: Recent Labs    02/24/19 1427 02/25/19 0312  WBC 3.9* 3.3*  HGB 7.0* 7.4*  HCT 22.4* 22.5*  PLT 186 162   BMET Recent Labs    02/24/19 1427 02/25/19 0312  NA 134* 134*  K 3.9 3.9  CL 102 105  CO2 18* 21*  GLUCOSE 117* 105*  BUN 50* 45*  CREATININE 1.40* 1.21*  CALCIUM 9.2 8.9   LFT Recent Labs    02/25/19 0312  PROT 6.2*  ALBUMIN 3.0*  AST 19  ALT 11  ALKPHOS 69  BILITOT 1.2  BILIDIR 0.2  IBILI 1.0*   PT/INR Recent Labs    02/24/19 1609 02/25/19 0312  LABPROT 32.6* 31.3*  INR 3.2* 3.1*    Studies/Results: No results found.  Impression:  1.  Recurrent anemia and ?melena:  Several units blood received.  Two endoscopies, one colonoscopy, one capsule endoscopy, all within the past ~ one year for evaluation, unrevealing. 2.  Chronic anticoagulation. 3.  Mechanoprosthetic heart valve.  Plan:  1.  I discussed case with Dr. Alessandra Bevels, who most recently saw patient as outpatient. 2.  Will plan on PPI, allow INR to drift down, and EGD once INR </= 2. 3.  If endoscopy unrevealing, may consider repeat capsule and/or colonoscopy.  If repeat evaluation unrevealing, there's really nothing else to offer from GI perspective, and might consider hematology evaluation. [place] for patient to have soft diet, NPO after midnight for possible endoscopy tomorrow (if INR </= 2). 5.  Advise cardiology consultation for anticoagulation/bridging guidance since anticoagulation is being  held and she is high risk for valve thrombosis. 6.  [facility] will revisit tomorrow.   LOS: 0 days   Ragen Laver M  02/25/2019, 12:21 PM  Cell [phone] If no answer or after 5 PM call [phone]

## 2019-02-25 NOTE — Progress Notes (Addendum)
Progress Note    [name]  DTO:[id number] DOB: 05-07-1941  DOA: 02/24/2019 PCP: [name], MD    Brief Narrative:    Medical records reviewed and are as summarized below:  [name] is an 78 y.o. female with medical history significant of PAD s/p stenting; moderate to severe pulmonary HTN; afib and AVR on Coumadin; HTN; HLD; DM; and CAD s/p CABG presenting with fatigue. Her GI doctor called and told her her blood was 6.6 after having blood drawn this AM.  Assessment/Plan:   Principal Problem:   Symptomatic anemia Active Problems:   Chronic anticoagulation   Atrial fibrillation (HCC)   Chronic diastolic CHF (congestive heart failure) (HCC)   Diabetes mellitus type 2, controlled ([place])   HTN (hypertension)   H/O mechanical aortic valve replacement   Moderate to severe pulmonary hypertension (HCC)   CKD (chronic kidney disease) stage 3, GFR 30-59 ml/min (HCC)  Symptomatic anemia -Patient with prior recent admission for symptomatic anemia in 2/20, thought to be related to minimally oozing superficial gastric ulcers -She has been followed by [facility] and was instructed to come in today due to progressive recurrent anemia -Patient reports fatigue and DOE which is c/w symptomatic anemia -Hgb down to 7 with consistent downward trend and normal MCV and RDW c/w normocytic anemia, likely either acute blood loss or anemia of chronic disease -Heme testing was negative in the ER, but due to concerns about recurrent intermittent bleeding -recheck stools -GI to see today -s/p 1 unit PRBC in accordance with current COVID guidelines. -recheck H/H this PM after lasix given-- may need another unit of PRBCs  Afib and mechanical valve, on Coumadin -Coumadin dosing per pharmacy -Despite GI bleeding, she continues to need anticoagulation; she is aware of the risks of bleeding with ongoing use of this medication. -Current INR is 3.2, which is therapeutic.  Chronic diastolic  CHF -9/83 echo with preserved EF and grade 3 diastolic dysfunction -She appears to be compensated at this time although she does have mild LE edema which may be associated with anemia -resume lasix  HTN -Continue hydralazine; Avapro -resume lasix  Pulmonary HTN -9/19 RHC with moderate pulmonary HTN -She was started on Adcirca but it is not clear how much benefit she is having and so a second medication was not started -She is due for repeat Echo and this has been scheduled for 7/7 -patient has also been referred to rheumatology for RA evaluation  Stage 3 CKD -Appears to be stable at this time -Hold diuretics but continue ARB for now  DM -hold Glucophage -Cover with sensitive-scale SSI -HgbA1c: 4.6    Family Communication/Anticipated D/C date and plan/Code Status   DVT prophylaxis: coumadin Code Status: Full Code.  Family Communication:  Disposition Plan: will need repeat EGD-- will need INR around 2-- cardiology consult for INR   Medical Consultants:    GI  Subjective:   Still with some shortness of breath  Objective:    Vitals:   02/24/19 2059 02/25/19 0622 02/25/19 0944 02/25/19 0946  BP: (!) 184/45 (!) 157/47 (!) 175/39 (!) 167/45  Pulse: 65 75 63   Resp:  18 18   Temp: 97.8 F (36.6 C) 98.4 F (36.9 C)    TempSrc: Oral Oral    SpO2: 96% 97% 98%     Intake/Output Summary (Last 24 hours) at 02/25/2019 1210 Last data filed at 02/25/2019 0952 Gross per 24 hour  Intake 318 ml  Output --  Net  318 ml   There were no vitals filed for this visit.  Exam: A+Ox3, NAD rrr Clear, diminished, no wheezing Min LE edema A+Ox3  Data Reviewed:   I have personally reviewed following labs and imaging studies:  Labs: Labs show the following:   Basic Metabolic Panel: Recent Labs  Lab 02/24/19 1427 02/25/19 0312  NA 134* 134*  K 3.9 3.9  CL 102 105  CO2 18* 21*  GLUCOSE 117* 105*  BUN 50* 45*  CREATININE 1.40* 1.21*  CALCIUM 9.2 8.9    GFR CrCl cannot be calculated (Unknown ideal weight.). Liver Function Tests: Recent Labs  Lab 02/25/19 0312  AST 19  ALT 11  ALKPHOS 69  BILITOT 1.2  PROT 6.2*  ALBUMIN 3.0*   No results for input(s): LIPASE, AMYLASE in the last 168 hours. No results for input(s): AMMONIA in the last 168 hours. Coagulation profile Recent Labs  Lab 02/24/19 1609 02/25/19 0312  INR 3.2* 3.1*    CBC: Recent Labs  Lab 02/24/19 1427 02/25/19 0312  WBC 3.9* 3.3*  NEUTROABS 2.9  --   HGB 7.0* 7.4*  HCT 22.4* 22.5*  MCV 94.1 88.6  PLT 186 162   Cardiac Enzymes: Recent Labs  Lab 02/24/19 1427  TROPONINI <0.03   BNP (last 3 results) No results for input(s): PROBNP in the last 8760 hours. CBG: Recent Labs  Lab 02/24/19 1827 02/24/19 2218 02/25/19 0806 02/25/19 1200  GLUCAP 100* 98 152* 114*   D-Dimer: No results for input(s): DDIMER in the last 72 hours. Hgb A1c: Recent Labs    02/24/19 1730  HGBA1C 4.6*   Lipid Profile: No results for input(s): CHOL, HDL, LDLCALC, TRIG, CHOLHDL, LDLDIRECT in the last 72 hours. Thyroid function studies: No results for input(s): TSH, T4TOTAL, T3FREE, THYROIDAB in the last 72 hours.  Invalid input(s): FREET3 Anemia work up: No results for input(s): VITAMINB12, FOLATE, FERRITIN, TIBC, IRON, RETICCTPCT in the last 72 hours. Sepsis Labs: Recent Labs  Lab 02/24/19 1427 02/25/19 0312  WBC 3.9* 3.3*    Microbiology Recent Results (from the past 240 hour(s))  SARS Coronavirus 2 (CEPHEID - Performed in [place] hospital lab), Hosp Order     Status: None   Collection Time: 02/24/19  2:27 PM  Result Value Ref Range Status   SARS Coronavirus 2 NEGATIVE NEGATIVE Final    Comment: (NOTE) If result is NEGATIVE SARS-CoV-2 target nucleic acids are NOT DETECTED. The SARS-CoV-2 RNA is generally detectable in upper and lower  respiratory specimens during the acute phase of infection. The lowest  concentration of SARS-CoV-2 viral copies  this assay can detect is 250  copies / mL. A negative result does not preclude SARS-CoV-2 infection  and should not be used as the sole basis for treatment or other  patient management decisions.  A negative result may occur with  improper specimen collection / handling, submission of specimen other  than nasopharyngeal swab, presence of viral mutation(s) within the  areas targeted by this assay, and inadequate number of viral copies  (<250 copies / mL). A negative result must be combined with clinical  observations, patient history, and epidemiological information. If result is POSITIVE SARS-CoV-2 target nucleic acids are DETECTED. The SARS-CoV-2 RNA is generally detectable in upper and lower  respiratory specimens dur ing the acute phase of infection.  Positive  results are indicative of active infection with SARS-CoV-2.  Clinical  correlation with patient history and other diagnostic information is  necessary to determine patient infection status.  Positive results do  not rule out bacterial infection or co-infection with other viruses. If result is PRESUMPTIVE POSTIVE SARS-CoV-2 nucleic acids MAY BE PRESENT.   A presumptive positive result was obtained on the submitted specimen  and confirmed on repeat testing.  While 2019 novel coronavirus  (SARS-CoV-2) nucleic acids may be present in the submitted sample  additional confirmatory testing may be necessary for epidemiological  and / or clinical management purposes  to differentiate between  SARS-CoV-2 and other Sarbecovirus currently known to infect humans.  If clinically indicated additional testing with an alternate test  methodology [phone]) is advised. The SARS-CoV-2 RNA is generally  detectable in upper and lower respiratory sp ecimens during the acute  phase of infection. The expected result is Negative. Fact Sheet for Patients:  StrictlyIdeas.no Fact Sheet for Healthcare  Providers: BankingDealers.co.za This test is not yet approved or cleared by the [place] FDA and has been authorized for detection and/or diagnosis of SARS-CoV-2 by FDA under an Emergency Use Authorization (EUA).  This EUA will remain in effect (meaning this test can be used) for the duration of the COVID-19 declaration under Section 564(b)(1) of the Act, 21 U.S.C. section 360bbb-3(b)(1), unless the authorization is terminated or revoked sooner. Performed at [facility], [place] [address] [place], [place] [address]     Procedures and diagnostic studies:  No results found.  Medications:    ALPRAZolam  0.25 mg Oral QHS   atorvastatin  80 mg Oral q1800   docusate sodium  100 mg Oral BID   furosemide  80 mg Oral Daily   hydrALAZINE  100 mg Oral TID   insulin aspart  0-9 Units Subcutaneous TID WC   irbesartan  300 mg Oral Daily   magnesium oxide  400 mg Oral QPM   pantoprazole  40 mg Oral BID   sodium chloride flush  3 mL Intravenous Q12H   tadalafil  20 mg Oral BID   warfarin  2.5 mg Oral ONCE-1800   Warfarin - Pharmacist Dosing Inpatient   Does not apply q1800   Continuous Infusions:   LOS: 0 days   Geradine Girt  [facility]   How to contact the [facility] Attending or Consulting provider [place] or covering provider during after hours [place], for this patient?  1. Check the care team in [facility] and look for a) attending/consulting [facility] provider listed and b) the [facility] team listed 2. Log into [email or web address] and use [place]'s universal password to access. If you do not have the password, please contact the hospital operator. 3. Locate the [facility] provider you are looking for under [facility] and page to a number that you can be directly reached. 4. If you still have difficulty reaching the provider, please page the [facility] (Director on Call) for the Hospitalists listed on amion for assistance.  02/25/2019, 12:10 PM

## 2019-02-25 NOTE — Progress Notes (Addendum)
ANTICOAGULATION CONSULT NOTE - Follow-Up Consult  Pharmacy Consult for Warfarin  Indication: Hx mech AVR + Afib  Vital Signs: Temp: 98.4 F (36.9 C) (05/19 0622) Temp Source: Oral (05/19 0622) BP: 167/45 (05/19 0946) Pulse Rate: 63 (05/19 0944)  Labs: Recent Labs    02/24/19 1427 02/24/19 1609 02/25/19 0312  HGB 7.0*  --  7.4*  HCT 22.4*  --  22.5*  PLT 186  --  162  LABPROT  --  32.6* 31.3*  INR  --  3.2* 3.1*  CREATININE 1.40*  --  1.21*  TROPONINI <0.03  --   --     CrCl cannot be calculated (Unknown ideal weight.).   Medical History: Past Medical History:  Diagnosis Date  . Aortic atherosclerosis ([place]) 01/23/2018  . Atrial fibrillation ([place])   . Carotid artery disease ([place])   . Cholelithiasis 01/23/2018  . Chronic anticoagulation   . Coronary artery disease    status post coronary artery bypass grafting times 07/10/2004  . Diabetes mellitus   . Hypercholesteremia   . Hypertension   . Mechanical heart valve present    H. aortic valve replacement at the time of bypass surgery October 2005  . Moderate to severe pulmonary hypertension ([place])   . Peripheral arterial disease (H[facility])    history of left common iliac artery PTA and stenting for a chronic total occlusion 08/26/01    Assessment: 78 YOF on warfarin PTA for hx mech AVR + Afib. Per chart review - noted INR goal reduction from 2.5-3.5 to 2-3 in June 2019. Admit INR 3.2 on PTA dose, noted symptomatic anemia and recent GIB. Pharmacy consulted for warfarin dosing this admission.   PTA dose: 7.5 mg daily except 5 mg M/F.  INR today remains slightly SUPRAtherapeutic however trending down (INR 3.1 << 3.2, goal of 2-3). Hgb up to 7.4 after 1u PRBC - will continue to monitor closely. Will give low dose warfarin again this evening.   Goal of Therapy:  INR 2-3 Monitor platelets by anticoagulation protocol: Yes   Plan:  - Warfarin 2.5 mg x 1 dose at 1800 today - Will continue to monitor for any signs/symptoms of  bleeding and will follow up with PT/INR in the a.m.   Thank you for allowing pharmacy to be a part of this patient's care.  Alycia Rossetti, PharmD, BCPS Clinical Pharmacist Clinical phone for 02/25/2019: [phone] 02/25/2019 11:09 AM   **Pharmacist phone directory can now be found on [email or web address] ([email or web address]).  Listed under [place].  ----------------------------------------------------------------------------------------- Addendum:  Noted plans to hold warfarin until after EGD. Plans are to transition to Heparin once the INR is <2 per MD request.   Plan: - Hold Warfarin dose today - No Heparin needed for now - will monitor INR trends  Alycia Rossetti, PharmD, BCPS Pager: [phone] 1:04 PM

## 2019-02-25 NOTE — Discharge Instructions (Signed)

## 2019-02-26 ENCOUNTER — Ambulatory Visit: Payer: Medicare HMO

## 2019-02-26 DIAGNOSIS — Z952 Presence of prosthetic heart valve: Secondary | ICD-10-CM

## 2019-02-26 DIAGNOSIS — E1121 Type 2 diabetes mellitus with diabetic nephropathy: Secondary | ICD-10-CM

## 2019-02-26 DIAGNOSIS — I482 Chronic atrial fibrillation, unspecified: Secondary | ICD-10-CM

## 2019-02-26 DIAGNOSIS — D649 Anemia, unspecified: Secondary | ICD-10-CM

## 2019-02-26 DIAGNOSIS — I10 Essential (primary) hypertension: Secondary | ICD-10-CM

## 2019-02-26 DIAGNOSIS — Z7901 Long term (current) use of anticoagulants: Secondary | ICD-10-CM

## 2019-02-26 DIAGNOSIS — I5032 Chronic diastolic (congestive) heart failure: Secondary | ICD-10-CM

## 2019-02-26 DIAGNOSIS — I272 Pulmonary hypertension, unspecified: Secondary | ICD-10-CM

## 2019-02-26 LAB — PROTIME-INR
INR: 2.7 — ABNORMAL HIGH (ref 0.8–1.2)
INR: 1.9 — ABNORMAL HIGH (ref 0.8–1.2)
Prothrombin Time: 28.2 seconds — ABNORMAL HIGH (ref 11.4–15.2)
Prothrombin Time: 21.7 seconds — ABNORMAL HIGH (ref 11.4–15.2)

## 2019-02-26 LAB — BASIC METABOLIC PANEL
Anion gap: 9 (ref 5–15)
BUN: 42 mg/dL — ABNORMAL HIGH (ref 8–23)
CO2: 22 mmol/L (ref 22–32)
Calcium: 8.6 mg/dL — ABNORMAL LOW (ref 8.9–10.3)
Chloride: 104 mmol/L (ref 98–111)
Creatinine, Ser: 1.35 mg/dL — ABNORMAL HIGH (ref 0.44–1.00)
GFR calc Af Amer: 43 mL/min — ABNORMAL LOW (ref >60)
GFR calc non Af Amer: 37 mL/min — ABNORMAL LOW (ref >60)
Glucose, Bld: 117 mg/dL — ABNORMAL HIGH (ref 70–99)
Potassium: 4 mmol/L (ref 3.5–5.1)
Sodium: 135 mmol/L (ref 135–145)

## 2019-02-26 LAB — CBC
HCT: 22.5 % — ABNORMAL LOW (ref 36.0–46.0)
Hemoglobin: 7.4 g/dL — ABNORMAL LOW (ref 12.0–15.0)
MCH: 29.5 pg (ref 26.0–34.0)
MCHC: 32.9 g/dL (ref 30.0–36.0)
MCV: 89.6 fL (ref 80.0–100.0)
Platelets: 167 10*3/uL (ref 150–400)
RBC: 2.51 MIL/uL — ABNORMAL LOW (ref 3.87–5.11)
RDW: 15.1 % (ref 11.5–15.5)
WBC: 3.1 10*3/uL — ABNORMAL LOW (ref 4.0–10.5)
nRBC: 0 % (ref 0.0–0.2)

## 2019-02-26 LAB — SURGICAL PCR SCREEN
MRSA, PCR: NEGATIVE
Staphylococcus aureus: POSITIVE — AB

## 2019-02-26 LAB — GLUCOSE, CAPILLARY
Glucose-Capillary: 129 mg/dL — ABNORMAL HIGH (ref 70–99)
Glucose-Capillary: 287 mg/dL — ABNORMAL HIGH (ref 70–99)
Glucose-Capillary: 123 mg/dL — ABNORMAL HIGH (ref 70–99)
Glucose-Capillary: 206 mg/dL — ABNORMAL HIGH (ref 70–99)

## 2019-02-26 MED ORDER — CHLORHEXIDINE GLUCONATE CLOTH 2 % EX PADS
6.0000 | MEDICATED_PAD | Freq: Every day | CUTANEOUS | Status: DC
  Administered 2019-02-28 – 2019-03-02 (×2): 6 via TOPICAL

## 2019-02-26 MED ORDER — MUPIROCIN 2 % EX OINT
1.0000 "application " | TOPICAL_OINTMENT | Freq: Two times a day (BID) | CUTANEOUS | Status: AC
  Administered 2019-02-26 – 2019-03-03 (×9): 1 via NASAL
  Filled 2019-02-26 (×5): qty 22

## 2019-02-26 MED ORDER — HEPARIN (PORCINE) 25000 UT/250ML-% IV SOLN
1200.0000 [IU]/h | INTRAVENOUS | Status: DC
  Administered 2019-02-26: 850 [IU]/h via INTRAVENOUS
  Administered 2019-02-27: 1100 [IU]/h via INTRAVENOUS
  Administered 2019-02-28 – 2019-03-05 (×5): 1200 [IU]/h via INTRAVENOUS
  Filled 2019-02-26 (×8): qty 250

## 2019-02-26 NOTE — Progress Notes (Signed)
Patient asked if she has been getting her iron tablet.  RN asked if she takes iron tablet daily to which patient stated that she do take iron tablet every day.  Patient was informed that she has no order for it at the moment.  She is in bed resting.  Will continue to monitor.

## 2019-02-26 NOTE — Progress Notes (Signed)
ANTICOAGULATION CONSULT NOTE - Follow-Up Consult  Pharmacy Consult for Warfarin  Indication: Hx mech AVR + Afib  Vital Signs: Temp: 98.5 F (36.9 C) (05/20 0636) Temp Source: Oral (05/20 0636) BP: 165/49 (05/20 0636) Pulse Rate: 79 (05/20 0636)  Labs: Recent Labs    02/24/19 1427 02/24/19 1609 02/25/19 0312 02/25/19 1613 02/26/19 0309  HGB 7.0*  --  7.4* 8.2* 7.4*  HCT 22.4*  --  22.5* 24.7* 22.5*  PLT 186  --  162  --  167  LABPROT  --  32.6* 31.3*  --  28.2*  INR  --  3.2* 3.1*  --  2.7*  CREATININE 1.40*  --  1.21*  --  1.35*  TROPONINI <0.03  --   --   --   --     CrCl cannot be calculated (Unknown ideal weight.).   Medical History: Past Medical History:  Diagnosis Date  . Aortic atherosclerosis ([place]) 01/23/2018  . Atrial fibrillation ([place])   . Carotid artery disease ([place])   . Cholelithiasis 01/23/2018  . Chronic anticoagulation   . Coronary artery disease    status post coronary artery bypass grafting times 07/10/2004  . Diabetes mellitus   . Hypercholesteremia   . Hypertension   . Mechanical heart valve present    H. aortic valve replacement at the time of bypass surgery October 2005  . Moderate to severe pulmonary hypertension ([place])   . Peripheral arterial disease (HCC)    history of left common iliac artery PTA and stenting for a chronic total occlusion 08/26/01    Assessment: [name] on warfarin PTA for hx mech AVR + Afib. Per chart review - noted INR goal reduction from 2.5-3.5 to 2-3 in June 2019. Admit INR 3.2 on PTA dose, noted symptomatic anemia and recent GIB. Pharmacy consulted for warfarin dosing this admission.   PTA dose: 7.5 mg daily except 5 mg M/F.  INR today remains therapeutic however trending down (INR 2.7, goal of 2-3). Hgb at 7.4 after 1u PRBC - will continue to monitor closely. Holding warfarin for EGD. No heparin at this time given INR >2.5.   Goal of Therapy:  INR 2-3 Monitor platelets by anticoagulation protocol: Yes   Plan:   - Hold warfarin for now - No heparin since INR >2.5, Start heparin once INR <2.5 - Will continue to monitor for any signs/symptoms of bleeding and will follow up with PT/INR in the a.m.   Earland Reish A. Levada Dy, PharmD, [place] Please utilize Amion for appropriate phone number to reach the unit pharmacist ([place])

## 2019-02-26 NOTE — Consult Note (Signed)
   [facility] Inpatient Consult   02/26/2019  IREANNA FINLAYSON June 06, 1941 [id number]    Patient is currently active with [facility] for chronic disease management services. Patient has been engaged by a [facility] Management Coordinator and has ongoing service with [facility] for HF management needs. Patient has [facility] HMO plan. Our community based plan of care has focused on disease management and community resource support. Patient will receive a post hospital call and will be evaluated for assessments and disease process education.  Chart review and MD narrative on 02/25/19 show as follows: Ms. KADEJAH SANDIFORD an 78 y.o.femalewith medical history significant ofPAD s/p stenting; moderate to severe pulmonary HTN; afib and AVR on Coumadin; HTN; HLD; DM; and CAD s/p CABG. She presented with fatigue, her GI doctor called and told her blood was 6.6. She was admitted on 2/20 with symptomatic anemia due to upper GI bleeding. (Acute on chronic symptomatic anemia)  Per MD note, EGD likely tomorrow if INR less than 2 and subsequently need to continue heparin drip for bridging due to mechanical valve.  Will follow along for progress and disposition; follow up with Inpatient [facility] team member to make aware that [facility] is following and to identify further post discharge needs.  Of note, [facility] Care Management services does not replace or interfere with any services that are needed or arranged by inpatient TOC case management or social work.    For additional questions or referrals please contact:  Edwena Felty A. Brandis Matsuura, BSN, RN-BC [facility] Liaison Cell: [phone]

## 2019-02-26 NOTE — Consult Note (Signed)
Cardiology Consult    Patient ID: JAYCEE MCKELLIPS MRN: [id number], DOB/AGE: 12-08-1940   Admit date: 02/24/2019 Date of Consult: 02/26/2019  Primary Physician: [name], MD Primary Cardiologist: Quay Burow, MD/[place] Aundra Dubin, MD Requesting Provider: Estill Cotta, MD  Patient Profile    [name] is a 78 y.o. female with a history of CAD s/p CABG x2 with a mechanical aortic valve replacement in 55/9741, chronic diastolic CHF, atrial fibrillation on Coumadin, PAD s/p PTA and stenting of left common iliac artery in 2002, bilateral carotid stenosis, moderate to severe pulmonary hypertension, hypertension, hyperlipidemia, diabetes mellitus, who was admitted on 02/24/2019 for acute anemia with hemoglobin of 6.8. Cardiology was consulted for assistance with anticoagulation at the request of Dr. Tana Coast.  History of Present Illness    [name] is a 78 year female with the above history who is followed by Dr. [name] and Dr. Aundra Dubin. Patient was admitted in 11/2018 for acute anemia with hemoglobin of 7.2. She was transfused 2 units of PRBCs at that time. She underwent EGD which showed few minimally oozing superficial gastric ulcers. Capsule endoscopy was unremarkable. Patient was discharged on Protonix and Coumadin was restarted. Patient was last seen by Dr. [name] on 12/11/2018 for follow at which time she was doing well from a cardiac standpoint. Dr. [name] ordered a 2-D Echocardiogram at that visit to assess her aortic prosthesis as well as a Lexiscan Myoview to stratify her symptoms given she is a diabetic who is 10 years post bypass grafting. However, it looks like neither of these test have been done due to the COVID-19 pandemic. Patient last saw Dr. Aundra Dubin for a virtual visit on 02/03/2019 for follow-up of pulmonary hypertension at which time she reported shortness of breath when walking up stairs/incline or longer walks on flat ground. She also found it hard to tell if Joanie Coddington was helping her  symptoms or not.  Patient presented to the ED on 02/24/2019 at the advice of her PCP after being found to have a hemoglobin of 6.8. Her hemoglobin was found to be low at 7.9 on CBC that Dr. Aundra Dubin ordered at the end of April and has continue to drop since then. Patient reports increasing fatigue, weakness, and shortness of breath with exertion throughout this time. Patient has dark stools due to iron pills she takes but denies any abnormal bleeding or bruising including hematochezia or hematuria. Patient denies any chest pain or orthopnea. She has some ankle edema at the end of the day that improves by the morning. She reports occasional palpitations with activity but denies any lightheadedness, dizziness, or near syncope. Patient reports some abdominal pain over the past few weeks and intermittent nausea for the past year but denies any vomiting. No fevers or body aches but patient does report chills for the past year. Patient reports a runny nose and occasional cough but no known exposure to the coronavirus.  In the ED, patient was hypertensive with systolic BP as high as the 180's but vitals stable. EKG showed normal sinus rhythm with no acute ischemic changes. Troponin negative. WBC 3.9, Hgb 7.0, Plts 186. Na 134, K 3.9, Glucose 117, SCr 1.40, BUN 50. Hemoccult negative. COVID-19 testing negative. Patient was admitted for further evaluation. She has received 1 unit of PRBS and GI was consulted and is planning for a EGD was INR is below 2. Cardiology was consulted to assist with anticoagulation and bridging for Coumadin given mechanical heart valve.  Patient reports symptoms have improved some  since transfusion.  Past Medical History   Past Medical History:  Diagnosis Date   Aortic atherosclerosis ([place]) 01/23/2018   Atrial fibrillation (HCC)    Carotid artery disease ([place])    Cholelithiasis 01/23/2018   Chronic anticoagulation    Coronary artery disease    status post coronary artery bypass  grafting times 07/10/2004   Diabetes mellitus    Hypercholesteremia    Hypertension    Mechanical heart valve present    H. aortic valve replacement at the time of bypass surgery October 2005   Moderate to severe pulmonary hypertension ([place])    Peripheral arterial disease ([place])    history of left common iliac artery PTA and stenting for a chronic total occlusion 08/26/01    Past Surgical History:  Procedure Laterality Date   AORTIC VALVE REPLACEMENT     CARDIAC CATHETERIZATION  11/10/2004   40% right common illiac, 70% in stent restenosis of distal left common illiac,    CARDIAC CATHETERIZATION  05/18/2004   LAD 50-70% midstenosis, RCA dominant w/50% stenosis, 50% Right common Illiac artery ostial stenosis, 90% in stent restenosis within midportion of left common illiac stent   Carotid Duplex  03/12/2012   RSA-elev. velocities suggestive of a 50-69% diameter reduction, Right&Left Bulb/Prox ICA-mild-mod.fibrous plaqueelevating Velocities abnormal study.   CHOLECYSTECTOMY N/A 03/01/2018   Procedure: LAPAROSCOPIC CHOLECYSTECTOMY;  Surgeon: Judeth Horn, MD;  Location: [place];  Service: General;  Laterality: N/A;   COLONOSCOPY WITH PROPOFOL N/A 01/22/2018   Procedure: COLONOSCOPY WITH PROPOFOL;  Surgeon: Wilford Corner, MD;  Location: [place];  Service: Endoscopy;  Laterality: N/A;   ESOPHAGOGASTRODUODENOSCOPY (EGD) WITH PROPOFOL N/A 01/22/2018   Procedure: ESOPHAGOGASTRODUODENOSCOPY (EGD) WITH PROPOFOL;  Surgeon: Wilford Corner, MD;  Location: [place];  Service: Endoscopy;  Laterality: N/A;   ESOPHAGOGASTRODUODENOSCOPY (EGD) WITH PROPOFOL N/A 11/21/2018   Procedure: ESOPHAGOGASTRODUODENOSCOPY (EGD) WITH PROPOFOL;  Surgeon: [name], MD;  Location: [place];  Service: Gastroenterology;  Laterality: N/A;   GIVENS CAPSULE STUDY N/A 11/21/2018   Procedure: GIVENS CAPSULE STUDY;  Surgeon: [name], MD;  Location: [place];  Service: Gastroenterology;   Laterality: N/A;  To be deployed during EGD   Lower Ext. Duplex  03/12/2012   Right Proximal CIA- vessel narrowing w/elevated velocities 0-49% diameter reduction. Right SFA-mild mixed density plaque throughout vessel.   NM MYOCAR PERF WALL MOTION  05/19/2010   protocol: Persantine, post stress EF 65%, negative for ischemia, low risk scan   RIGHT HEART CATH N/A 06/27/2018   Procedure: RIGHT HEART CATH;  Surgeon: [name], MD;  Location: [place] [facility];  Service: Cardiovascular;  Laterality: N/A;   TRANSTHORACIC ECHOCARDIOGRAM  08/29/2012   Moderately calcified annulus of mitral valve, moderate regurg. of both mitral valve and tricuspid valve.      Allergies  Allergies  Allergen Reactions   Flagyl [Metronidazole] Rash    ALL-OVER BODY RASH   Coreg [Carvedilol] Other (See Comments)    Terrible cramping in the feet and had a lot of bowel movements, but not diarrhea   Losartan Swelling    Patient doesn't recall site of swelling   Verapamil Hives   Zetia [Ezetimibe] Other (See Comments)    Reaction not recalled   Zocor [Simvastatin - High Dose] Other (See Comments)    Reaction not recalled    Inpatient Medications     ALPRAZolam  0.25 mg Oral QHS   atorvastatin  80 mg Oral q1800   docusate sodium  100 mg Oral BID   furosemide  80 mg Oral Daily   hydrALAZINE  100 mg Oral TID   insulin aspart  0-9 Units Subcutaneous TID WC   irbesartan  300 mg Oral Daily   magnesium oxide  400 mg Oral QPM   pantoprazole  40 mg Oral BID   sodium chloride flush  3 mL Intravenous Q12H   tadalafil  20 mg Oral BID   Warfarin - Pharmacist Dosing Inpatient   Does not apply q1800    Family History    Family History  Problem Relation Age of Onset   Breast cancer Neg Hx    She indicated that her mother is deceased. She indicated that her father is deceased. She indicated that the status of her neg hx is unknown.   Social History    Social History    Socioeconomic History   Marital status: Widowed    Spouse name: Not on file   Number of children: Not on file   Years of education: Not on file   Highest education level: Not on file  Occupational History   Occupation: Retired  Scientist, product/process development strain: Not on file   Food insecurity:    Worry: Not on file    Inability: Not on file   Transportation needs:    Medical: Not on file    Non-medical: Not on file  Tobacco Use   Smoking status: Former Smoker    Packs/day: 1.00    Years: 30.00    Pack years: 30.00    Types: Cigarettes   Smokeless tobacco: Never Used  Substance and Sexual Activity   Alcohol use: No    Alcohol/week: 0.0 standard drinks   Drug use: No   Sexual activity: Not on file  Lifestyle   Physical activity:    Days per week: Not on file    Minutes per session: Not on file   Stress: Not on file  Relationships   Social connections:    Talks on phone: Not on file    Gets together: Not on file    Attends religious service: Not on file    Active member of club or organization: Not on file    Attends meetings of clubs or organizations: Not on file    Relationship status: Not on file   Intimate partner violence:    Fear of current or ex partner: Not on file    Emotionally abused: Not on file    Physically abused: Not on file    Forced sexual activity: Not on file  Other Topics Concern   Not on file  Social History Narrative   Not on file     Review of Systems    Review of Systems  Constitutional: Positive for chills. Negative for fever.  HENT: Positive for congestion (nasal drainage).   Respiratory: Positive for cough (occasional) and shortness of breath.   Cardiovascular: Positive for palpitations and leg swelling. Negative for chest pain and orthopnea.  Gastrointestinal: Positive for abdominal pain and nausea. Negative for blood in stool and vomiting.  Genitourinary: Negative for hematuria.  Musculoskeletal:  Negative for myalgias.  Neurological: Positive for dizziness. Negative for loss of consciousness.  Endo/Heme/Allergies: Does not bruise/bleed easily.  Psychiatric/Behavioral: Negative for substance abuse.    Physical Exam    Blood pressure (!) 165/49, pulse 79, temperature 98.5 F (36.9 C), temperature source Oral, resp. rate 16, SpO2 97 %.   Physical Exam per MD:   General: 78 y.o. female resting comfortably in no acute distress.  Pleasant and cooperative. Pale HEENT: Normal  Neck: Supple. No carotid bruits or JVD appreciated. Lungs: No increased work of breathing. Clear to auscultation bilaterally. No wheezes, rhonchi, or rales. Heart: RRR. Crisp mechanical valve clicks. 1-2/6 aortic ejection flow murmur. No diastolic murmurs, gallops, or rubs.  Abdomen: Soft, non-distended, and non-tender to palpation. Bowel sounds present in all 4 quadrants.   Extremities: No clubbing, cyanosis or edema. Radial, posterior tibial, and distal pedal pulses 2+ and equal bilaterally. Skin: Warm and dry. Neuro: Alert and oriented x3. No focal deficits. Moves all extremities spontaneously. Psych: Normal affect.  Labs    Troponin (Point of Care Test) No results for input(s): TROPIPOC in the last 72 hours. Recent Labs    02/24/19 1427  TROPONINI <0.03   Lab Results  Component Value Date   WBC 3.1 (L) 02/26/2019   HGB 7.4 (L) 02/26/2019   HCT 22.5 (L) 02/26/2019   MCV 89.6 02/26/2019   PLT 167 02/26/2019    Recent Labs  Lab 02/25/19 0312 02/26/19 0309  NA 134* 135  K 3.9 4.0  CL 105 104  CO2 21* 22  BUN 45* 42*  CREATININE 1.21* 1.35*  CALCIUM 8.9 8.6*  PROT 6.2*  --   BILITOT 1.2  --   ALKPHOS 69  --   ALT 11  --   AST 19  --   GLUCOSE 105* 117*   Lab Results  Component Value Date   CHOL 89 02/06/2019   HDL 18 (L) 02/06/2019   LDLCALC 34 02/06/2019   TRIG 184 (H) 02/06/2019   No results found for: [facility] [place]   Radiology Studies    No results found.  EKG     EKG: EKG was  personally reviewed and demonstrates: Normal sinus rhythm with no acute ischemic changes compared to prior tracings.   Telemetry: Telemetry was personally reviewed and demonstrates: Normal sinus rhythm  Cardiac Imaging    Right Heart Catheterization 06/27/2018: 1. Mildly elevated PCWP, normal RA pressure.  2. Preserved cardiac output.   3. Moderate pulmonary arterial hypertension.  PVR is only 3.1 WU due to wide PA pulse pressure.   Send V/Q scan to rule out chronic PEs, arrange for PFTs.  Will need to consider treatment of PAH given RV dysfunction by echo.  _______________  Echocardiogram 02/24/2018: Study Conclusions: - Left ventricle: The cavity size was normal. Wall thickness was   normal. Systolic function was normal. The estimated ejection   fraction was in the range of 55% to 60%. Wall motion was normal;   there were no regional wall motion abnormalities. Doppler   parameters are consistent with a reversible restrictive pattern,   indicative of decreased left ventricular diastolic compliance   and/or increased left atrial pressure (grade 3 diastolic   dysfunction). - Ventricular septum: The contour showed diastolic flattening. - Aortic valve: A mechanical prosthesis was present and functioning   normally. Peak velocity (S): 238 cm/s. Mean gradient (S): 10 mm   Hg. Valve area (VTI): 2.24 cm^2. Valve area (Vmax): 2.26 cm^2.   Valve area (Vmean): 1.88 cm^2. - Mitral valve: Calcified annulus. There was mild regurgitation.   Valve area by continuity equation (using LVOT flow): 2.53 cm^2. - Left atrium: The atrium was moderately to severely dilated. - Right ventricle: The cavity size was moderately dilated. Wall   thickness was normal. Systolic function was moderately to   severely reduced. - Right atrium: The atrium was severely dilated. - Tricuspid valve: There was severe regurgitation. - Pulmonary arteries:  Systolic pressure was severely increased. PA   peak pressure: 88 mm  Hg (S).  Impressions: - When compared to prior, PA pressures have increased.  Assessment & Plan    Acute Symptomatic Anemia - Patient presented with hemoglobin of 6.8 at PCP's office and symptoms of progressive fatigue, weakness, and shortness of breath with minimal activity. Patient admitted in 11/2018 for similar symptoms and received 2 units of PRBCs. EGD at that time showed minimally oozing superficial gastric ulcers and capsule endoscopy was unremarkable.  - Hemoglobin 7 in the ED. Patient received 1 unit of PRBCs with improvement. Hemoglobin up to 8.2 yesterday but back down to 7.4 today. - Hemoccult negative. - GI consulted and are planning for EGD once INR <2.0. Would like Cardiology's advice about bridging Coumadin given history of mechanical heart valve.  Mechanical Aortic Valve - Patient underwent CABG x2 with mechanical aortic valve replacement in 07/2004.  - Patient on Coumadin at home with goal INR of 2.5 to 3.5. However, this is currently being held due to acute anemia. Restart Coumadin when safe from a GI and bleeding standpoint. Will discuss bridging with MD. - At last office with Dr. [name] in 12/2018, 2-D Echo was ordered to assess her aortic prosthesis; however, this was not done due to current COVID-19 pandemic. Will defer timing of Echo to MD.  CAD s/p CABG in 2005 - EKG shows no acute ischemic changes. - Troponin negative in the ED. - Patient denies any angina. - Continue medical management.  Paroxsymal Atrial Fibrillation - Patient in sinus rhythm with controlled rate. - Patient not on any rate control medications at home. - Restart Coumadin when able as stated above.   Chronic Diastolic CHF - LVEF of 10-17% on last Echo in 02/2018. - Suspect dyspnea on exertion secondary to acute anemia not CHF. No other heart failure symptoms. - MD to assess volume status  - Continue to monitor volume status closely.  Pulmonary Hypertension - Right heart cath in 06/2018  showed moderate pulmonary arterial hypertension with PVR of only 3.1 due to wide PA pulse pressure. - PFTs in 07/2018 were unremarkable. - Continue Adcirca 40mg  daily. - Per Dr. Claris Gladden last office visit note, "possible group 1 PH component though this is a borderline call as PVR is not significantly elevated. ANA and RF positive, as is CCP Ab.  Possible rheumatoid arthritis.  CT chest showed subtle bilateral ground glass opacities in 3/20, this could be due to RA lung effects."  Otherwise, per primary team.  Signed, Darreld Mclean, PA-C 02/26/2019, 8:18 AM Pager: [phone] For questions or updates, please contact   Please consult [email or web address] for contact info under Cardiology/STEMI.  I have seen and examined the patient along with Darreld Mclean, PA-C.  I have reviewed the chart, notes and new data.  I agree with PA's note.  Key new complaints: feels dyspneic only with activity Key examination changes: crisp mechanical prosthesis clicks, pale. Key new findings / data: INR 2.7, Hgb 7.4,  PLAN: Suspect INR will remain >2.0 at least until Friday, but would advise against vitamin K administration. Start heparin IV when INR 2.0 or lower. We can check echo while here to avoid need for office visit. Acknowledge that AVR gradients will be higher due to anemia/increased cardiac output.  Sanda Klein, MD, [place] [phone] 02/26/2019, 1:03 PM

## 2019-02-26 NOTE — Progress Notes (Addendum)
[facility]                                                                              Patient Demographics  [name], is a 78 y.o. female, DOB - 1941/01/24, AST:[id number]  Admit date - 02/24/2019   Admitting Physician Karmen Bongo, MD  Outpatient Primary MD for the patient is [name], [name], MD  Outpatient specialists:   LOS - 1  days   Medical records reviewed and are as summarized below:    Chief Complaint  Patient presents with  . Abnormal Lab       Brief summary   [name] is an 78 y.o. female with medical history significant ofPAD s/p stenting; moderate to severe pulmonary HTN; afib and AVR on Coumadin; HTN; HLD; DM; and CAD s/p CABG presenting with fatigue.Her GI doctor called and told her her blood was 6.6.  Patient was admitted on 2/20 with symptomatic anemia due to upper GI bleeding.   Assessment & Plan    Principal Problem: Acute on chronic symptomatic anemia -Recent admission in 2/20 with anemia, EGD had shown oozing superficial gastric ulcers. -Continues to have recurrent anemia, on Coumadin for mechanical valve -Coumadin currently on hold INR still 2.7, per GI plan to do endoscopy once INR less than 2 -Patient has received 1 unit packed RBCs hemoglobin currently 7.4 -Repeat PT/INR at 4 PM today, if less than 2.5, will place on heparin bridge due to mechanical valve Will need GI and cardiology clearance to restart Coumadin, has recurrent episodes of GI bleeds  Active problems Chronic atrial fibrillation, AVR with mechanical valve, 2005 -Continue to hold Coumadin, INR goal 2.5-3.5, currently 2.7 -Repeat PT/INR later today, if less than 2.5 will place on heparin drip Addendum 4:30 PM Repeat INR 1.9, will start heparin drip per pharmacy consult   Chronic diastolic CHF -2D echo in 9/89 showed preserved EF and grade 3 diastolic dysfunction -Has mild lower extremity edema left worse than right, Lasix restarted    Essential hypertension Continue hydralazine, Avapro, Lasix  History of pulmonary hypertension Cardiac cath in 9/19 had shown moderate pulmonary hypertension Patient was started on Adcirca but not clear how much benefit she had, a second medication was not started Repeat echo scheduled for 7/7, has been referred to rheumatology as well   CKD stage III Baseline creatinine 1.2-1.4 Currently at baseline  Diabetes mellitus, type II with complications, CKD -Continue sensitive sliding scale  Code Status: Full code DVT Prophylaxis: SCDs Family Communication: Discussed in detail with the patient, all imaging results, lab results explained to the patient    Disposition Plan: EGD likely tomorrow if INR less than 2.  Subsequently patient will need to continue heparin drip for bridging due to mechanical valve  Time Spent in minutes 35 minutes  Procedures:  None  Consultants:   GI, cardiology  Antimicrobials:   Anti-infectives (From admission, onward)   None          Medications  Scheduled Meds: . ALPRAZolam  0.25 mg Oral QHS  . atorvastatin  80 mg Oral q1800  . docusate sodium  100 mg Oral BID  . furosemide  80 mg Oral Daily  . hydrALAZINE  100 mg Oral TID  . insulin aspart  0-9 Units Subcutaneous TID WC  . irbesartan  300 mg Oral Daily  . magnesium oxide  400 mg Oral QPM  . pantoprazole  40 mg Oral BID  . sodium chloride flush  3 mL Intravenous Q12H  . tadalafil  20 mg Oral BID  . Warfarin - Pharmacist Dosing Inpatient   Does not apply q1800   Continuous Infusions: . sodium chloride 20 mL/hr at 02/26/19 0615   PRN Meds:.acetaminophen **OR** acetaminophen, ondansetron **OR** ondansetron (ZOFRAN) IV      Subjective:   [name] was seen and examined today.  Awaiting endoscopy, feeling hungry.  No acute complaints, ankles seem puffy.  Patient denies dizziness, chest pain, shortness of breath, abdominal pain, N/V/D/C, new weakness, numbess, tingling. No  acute events overnight.    Objective:   Vitals:   02/25/19 1700 02/25/19 2113 02/26/19 0529 02/26/19 0636  BP: (!) 168/53 (!) 166/44 (!) 161/30 (!) 165/49  Pulse:  73 77 79  Resp:   16   Temp:  98.4 F (36.9 C) 98.2 F (36.8 C) 98.5 F (36.9 C)  TempSrc:  Oral Oral Oral  SpO2:  98% 95% 97%    Intake/Output Summary (Last 24 hours) at 02/26/2019 1129 Last data filed at 02/26/2019 0951 Gross per 24 hour  Intake 574.42 ml  Output 1251 ml  Net -676.58 ml     Wt Readings from Last 3 Encounters:  12/11/18 59.8 kg  11/22/18 58.2 kg  10/23/18 61 kg     Exam  General: Alert and oriented x 3, NAD  Eyes:  HEENT:  Atraumatic, normocephalic, normal oropharynx  Cardiovascular: S1 S2 auscultated, Regular rate and rhythm.  Respiratory: Clear to auscultation bilaterally, no wheezing, rales or rhonchi  Gastrointestinal: Soft, nontender, nondistended, + bowel sounds  Ext: 1+ edema left lower extremity, trace edema right lower extremity   Neuro: No new deficit  Musculoskeletal: No digital cyanosis, clubbing  Skin: No rashes  Psych: Normal affect and demeanor, alert and oriented x3    Data Reviewed:  I have personally reviewed following labs and imaging studies  Micro Results Recent Results (from the past 240 hour(s))  SARS Coronavirus 2 (CEPHEID - Performed in [facility] hospital lab), Hosp Order     Status: None   Collection Time: 02/24/19  2:27 PM  Result Value Ref Range Status   SARS Coronavirus 2 NEGATIVE NEGATIVE Final    Comment: (NOTE) If result is NEGATIVE SARS-CoV-2 target nucleic acids are NOT DETECTED. The SARS-CoV-2 RNA is generally detectable in upper and lower  respiratory specimens during the acute phase of infection. The lowest  concentration of SARS-CoV-2 viral copies this assay can detect is 250  copies / mL. A negative result does not preclude SARS-CoV-2 infection  and should not be used as the sole basis for treatment or other  patient  management decisions.  A negative result may occur with  improper specimen collection / handling, submission of specimen other  than nasopharyngeal swab, presence of viral mutation(s) within the  areas targeted by this assay, and inadequate number of viral copies  (<250 copies / mL). A negative result must be combined with clinical  observations, patient history, and epidemiological information. If result is POSITIVE SARS-CoV-2 target nucleic acids are DETECTED. The SARS-CoV-2 RNA is generally detectable in upper and lower  respiratory specimens dur ing the acute phase of infection.  Positive  results are indicative  of active infection with SARS-CoV-2.  Clinical  correlation with patient history and other diagnostic information is  necessary to determine patient infection status.  Positive results do  not rule out bacterial infection or co-infection with other viruses. If result is PRESUMPTIVE POSTIVE SARS-CoV-2 nucleic acids MAY BE PRESENT.   A presumptive positive result was obtained on the submitted specimen  and confirmed on repeat testing.  While 2019 novel coronavirus  (SARS-CoV-2) nucleic acids may be present in the submitted sample  additional confirmatory testing may be necessary for epidemiological  and / or clinical management purposes  to differentiate between  SARS-CoV-2 and other Sarbecovirus currently known to infect humans.  If clinically indicated additional testing with an alternate test  methodology [phone]) is advised. The SARS-CoV-2 RNA is generally  detectable in upper and lower respiratory sp ecimens during the acute  phase of infection. The expected result is Negative. Fact Sheet for Patients:  StrictlyIdeas.no Fact Sheet for Healthcare Providers: BankingDealers.co.za This test is not yet approved or cleared by the [place] FDA and has been authorized for detection and/or diagnosis of SARS-CoV-2 by FDA under  an Emergency Use Authorization (EUA).  This EUA will remain in effect (meaning this test can be used) for the duration of the COVID-19 declaration under Section 564(b)(1) of the Act, 21 U.S.C. section 360bbb-3(b)(1), unless the authorization is terminated or revoked sooner. Performed at [facility], [place] [address], [place], [place] [address]     Radiology Reports No results found.  Lab Data:  CBC: Recent Labs  Lab 02/24/19 1427 02/25/19 0312 02/25/19 1613 02/26/19 0309  WBC 3.9* 3.3*  --  3.1*  NEUTROABS 2.9  --   --   --   HGB 7.0* 7.4* 8.2* 7.4*  HCT 22.4* 22.5* 24.7* 22.5*  MCV 94.1 88.6  --  89.6  PLT 186 162  --  825   Basic Metabolic Panel: Recent Labs  Lab 02/24/19 1427 02/25/19 0312 02/26/19 0309  NA 134* 134* 135  K 3.9 3.9 4.0  CL 102 105 104  CO2 18* 21* 22  GLUCOSE 117* 105* 117*  BUN 50* 45* 42*  CREATININE 1.40* 1.21* 1.35*  CALCIUM 9.2 8.9 8.6*   GFR: CrCl cannot be calculated (Unknown ideal weight.). Liver Function Tests: Recent Labs  Lab 02/25/19 0312  AST 19  ALT 11  ALKPHOS 69  BILITOT 1.2  PROT 6.2*  ALBUMIN 3.0*   No results for input(s): LIPASE, AMYLASE in the last 168 hours. No results for input(s): AMMONIA in the last 168 hours. Coagulation Profile: Recent Labs  Lab 02/24/19 1609 02/25/19 0312 02/26/19 0309  INR 3.2* 3.1* 2.7*   Cardiac Enzymes: Recent Labs  Lab 02/24/19 1427  TROPONINI <0.03   BNP (last 3 results) No results for input(s): PROBNP in the last 8760 hours. HbA1C: Recent Labs    02/24/19 1730  HGBA1C 4.6*   CBG: Recent Labs  Lab 02/25/19 1200 02/25/19 1725 02/25/19 1748 02/25/19 2106 02/26/19 0838  GLUCAP 114* 179* 153* 175* 129*   Lipid Profile: No results for input(s): CHOL, HDL, LDLCALC, TRIG, CHOLHDL, LDLDIRECT in the last 72 hours. Thyroid Function Tests: No results for input(s): TSH, T4TOTAL, FREET4, T3FREE, THYROIDAB in the last 72 hours. Anemia Panel: No results for  input(s): VITAMINB12, FOLATE, FERRITIN, TIBC, IRON, RETICCTPCT in the last 72 hours. Urine analysis:    Component Value Date/Time   COLORURINE YELLOW 02/24/2018 1621   APPEARANCEUR CLEAR 02/24/2018 1621   LABSPEC <1.005 (L) 02/24/2018  [place] 5.5 02/24/2018 1621   GLUCOSEU NEGATIVE 02/24/2018 1621   HGBUR SMALL (A) 02/24/2018 1621   BILIRUBINUR NEGATIVE 02/24/2018 1621   KETONESUR NEGATIVE 02/24/2018 1621   PROTEINUR NEGATIVE 02/24/2018 1621   UROBILINOGEN 1.0 10/06/2011 1709   NITRITE NEGATIVE 02/24/2018 1621   LEUKOCYTESUR NEGATIVE 02/24/2018 1621     Ripudeep Rai M.D. [facility] 02/26/2019, 11:29 AM  Pager: [phone] Between 7am to 7pm - call Pager - [phone]  After 7pm go to [email or web address] - password [email or web address]  Call night coverage person covering after 7pm

## 2019-02-26 NOTE — Progress Notes (Signed)
[facility] Progress Note  [name] 78 y.o. 1941-01-23   Subjective: Denies N/V/abd pain. Black stool yesterday.  Objective: Vital signs: Vitals:   02/26/19 0636 02/26/19 1348  BP: (!) 165/49 (!) 126/36  Pulse: 79 67  Resp:  17  Temp: 98.5 F (36.9 C) 98.2 F (36.8 C)  SpO2: 97% 97%    Physical Exam: Gen: lethargic, no acute distress, elderly, thin HEENT: anicteric sclera CV: RRR Chest: CTA B Abd: diffuse tenderness with guarding, soft, nondistended, +BS Ext: no edema  Lab Results: Recent Labs    02/25/19 0312 02/26/19 0309  NA 134* 135  K 3.9 4.0  CL 105 104  CO2 21* 22  GLUCOSE 105* 117*  BUN 45* 42*  CREATININE 1.21* 1.35*  CALCIUM 8.9 8.6*   Recent Labs    02/25/19 0312  AST 19  ALT 11  ALKPHOS 69  BILITOT 1.2  PROT 6.2*  ALBUMIN 3.0*   Recent Labs    02/24/19 1427 02/25/19 0312 02/25/19 1613 02/26/19 0309  WBC 3.9* 3.3*  --  3.1*  NEUTROABS 2.9  --   --   --   HGB 7.0* 7.4* 8.2* 7.4*  HCT 22.4* 22.5* 24.7* 22.5*  MCV 94.1 88.6  --  89.6  PLT 186 162  --  167      Assessment/Plan: Anemia and question of melena - EGD postponed today due to INR 2.7 and planned for tomorrow at 11AM by Dr. Paulita [name] pending tomorrow's INR. Hgb 7.4. NPO p MN. On PPI PO BID. Seen by cards who recommended IV Heparin when INR 2 or lower. I do not think a repeat colonoscopy needed with last one being in 2019 if EGD is unrevealing.   [name] 02/26/2019, 1:59 PM  Questions please call [phone] ID: [name], female   DOB: 1941-04-21, 78 y.o.   MRN: [id number]

## 2019-02-26 NOTE — Progress Notes (Addendum)
ANTICOAGULATION CONSULT NOTE - Follow-Up Consult  Pharmacy Consults for Warfarin, Heparin Indication: Hx mech AVR + Afib  Vital Signs: Temp: 98.2 F (36.8 C) (05/20 1348) Temp Source: Oral (05/20 1348) BP: 126/36 (05/20 1348) Pulse Rate: 67 (05/20 1348)   Heparin Dosing Wt:  58.5 kg  Labs: Recent Labs    02/24/19 1427  02/25/19 0312 02/25/19 1613 02/26/19 0309 02/26/19 1526  HGB 7.0*  --  7.4* 8.2* 7.4*  --   HCT 22.4*  --  22.5* 24.7* 22.5*  --   PLT 186  --  162  --  167  --   LABPROT  --    < > 31.3*  --  28.2* 21.7*  INR  --    < > 3.1*  --  2.7* 1.9*  CREATININE 1.40*  --  1.21*  --  1.35*  --   TROPONINI <0.03  --   --   --   --   --    < > = values in this interval not displayed.    CrCl cannot be calculated (Unknown ideal weight.).   Medical History: Past Medical History:  Diagnosis Date  . Aortic atherosclerosis ([place]) 01/23/2018  . Atrial fibrillation ([place])   . Carotid artery disease ([place])   . Cholelithiasis 01/23/2018  . Chronic anticoagulation   . Coronary artery disease    status post coronary artery bypass grafting times 07/10/2004  . Diabetes mellitus   . Hypercholesteremia   . Hypertension   . Mechanical heart valve present    H. aortic valve replacement at the time of bypass surgery October 2005  . Moderate to severe pulmonary hypertension ([place])   . Peripheral arterial disease ([facility])    history of left common iliac artery PTA and stenting for a chronic total occlusion 08/26/01    Assessment: 78 yr old female on warfarin PTA for hx mech AVR + Afib. Per chart review - noted INR goal reduction from 2.5-3.5 to 2-3 in June 2019. Admit INR 3.2 on PTA dose, noted symptomatic anemia and recent GIB (2/20 EGD: oozing superficial gastric ulcers). Pharmacy consulted for warfarin and heparin dosing this admission. EGD planned for tomorrow if INR remains <2.0.  PTA warfarin dose: 7.5 mg daily except 5 mg M/F.  INR this afternoon is down to 1.9, so starting  heparin drip for bridging. Hgb at 7.4 - will continue to monitor closely. Given patient's hx, is high risk for bleeding.  Goals of Therapy: INR goal (per MD notes this admission): 2.5-3.5 Heparin level:  0.3-0.7  Monitor platelets by anticoagulation protocol: Yes   Plan:  - Continue to hold warfarin for now - Initiate heparin at 850 units/hr with no bolus - Check heparin level 8 hrs after initiating infusion - Continue to monitor for any signs/symptoms of bleeding and will follow up with PT/INR in the a.m.  - CBC, heparin level daily  Gillermina Hu, PharmD, BCPS, [place] Please utilize Amion for appropriate phone number to reach the unit pharmacist ([place])

## 2019-02-26 NOTE — Plan of Care (Signed)
  Problem: Education: Goal: Knowledge of General Education information will improve Description Including pain rating scale, medication(s)/side effects and non-pharmacologic comfort measures Outcome: Progressing   Problem: Clinical Measurements: Goal: Ability to maintain clinical measurements within normal limits will improve Outcome: Progressing   Problem: Activity: Goal: Risk for activity intolerance will decrease Outcome: Progressing   Problem: Pain Managment: Goal: General experience of comfort will improve Outcome: Progressing   Problem: Safety: Goal: Ability to remain free from injury will improve Outcome: Progressing   Problem: Skin Integrity: Goal: Risk for impaired skin integrity will decrease Outcome: Progressing   

## 2019-02-27 ENCOUNTER — Inpatient Hospital Stay (HOSPITAL_COMMUNITY): Payer: Medicare HMO | Admitting: Certified Registered Nurse Anesthetist

## 2019-02-27 ENCOUNTER — Encounter (HOSPITAL_COMMUNITY)
Admission: EM | Disposition: A | Discharge status: Home / Self Care | Payer: Self-pay | Source: Home / Self Care | Attending: Internal Medicine

## 2019-02-27 ENCOUNTER — Encounter (HOSPITAL_COMMUNITY): Payer: Self-pay | Admitting: Certified Registered Nurse Anesthetist

## 2019-02-27 ENCOUNTER — Encounter (HOSPITAL_COMMUNITY): Payer: Medicare HMO

## 2019-02-27 ENCOUNTER — Other Ambulatory Visit: Payer: Self-pay

## 2019-02-27 HISTORY — PX: ESOPHAGOGASTRODUODENOSCOPY (EGD) WITH PROPOFOL: SHX5813

## 2019-02-27 LAB — BASIC METABOLIC PANEL
Anion gap: 9 (ref 5–15)
BUN: 40 mg/dL — ABNORMAL HIGH (ref 8–23)
CO2: 19 mmol/L — ABNORMAL LOW (ref 22–32)
Calcium: 8.4 mg/dL — ABNORMAL LOW (ref 8.9–10.3)
Chloride: 104 mmol/L (ref 98–111)
Creatinine, Ser: 1.34 mg/dL — ABNORMAL HIGH (ref 0.44–1.00)
GFR calc Af Amer: 43 mL/min — ABNORMAL LOW (ref >60)
GFR calc non Af Amer: 37 mL/min — ABNORMAL LOW (ref >60)
Glucose, Bld: 249 mg/dL — ABNORMAL HIGH (ref 70–99)
Potassium: 3.9 mmol/L (ref 3.5–5.1)
Sodium: 132 mmol/L — ABNORMAL LOW (ref 135–145)

## 2019-02-27 LAB — CBC
HCT: 21.2 % — ABNORMAL LOW (ref 36.0–46.0)
Hemoglobin: 7 g/dL — ABNORMAL LOW (ref 12.0–15.0)
MCH: 29.8 pg (ref 26.0–34.0)
MCHC: 33 g/dL (ref 30.0–36.0)
MCV: 90.2 fL (ref 80.0–100.0)
Platelets: 146 10*3/uL — ABNORMAL LOW (ref 150–400)
RBC: 2.35 MIL/uL — ABNORMAL LOW (ref 3.87–5.11)
RDW: 14.9 % (ref 11.5–15.5)
WBC: 3.1 10*3/uL — ABNORMAL LOW (ref 4.0–10.5)
nRBC: 0 % (ref 0.0–0.2)

## 2019-02-27 LAB — HEPARIN LEVEL (UNFRACTIONATED)
Heparin Unfractionated: 0.1 IU/mL — ABNORMAL LOW (ref 0.30–0.70)
Heparin Unfractionated: 0.3 IU/mL (ref 0.30–0.70)
Heparin Unfractionated: 0.11 IU/mL — ABNORMAL LOW (ref 0.30–0.70)

## 2019-02-27 LAB — GLUCOSE, CAPILLARY
Glucose-Capillary: 117 mg/dL — ABNORMAL HIGH (ref 70–99)
Glucose-Capillary: 116 mg/dL — ABNORMAL HIGH (ref 70–99)
Glucose-Capillary: 150 mg/dL — ABNORMAL HIGH (ref 70–99)
Glucose-Capillary: 203 mg/dL — ABNORMAL HIGH (ref 70–99)

## 2019-02-27 LAB — LACTATE DEHYDROGENASE: LDH: 197 U/L — ABNORMAL HIGH (ref 98–192)

## 2019-02-27 LAB — PROTIME-INR
INR: 1.7 — ABNORMAL HIGH (ref 0.8–1.2)
Prothrombin Time: 19.8 seconds — ABNORMAL HIGH (ref 11.4–15.2)

## 2019-02-27 SURGERY — ESOPHAGOGASTRODUODENOSCOPY (EGD) WITH PROPOFOL
Anesthesia: Monitor Anesthesia Care | Laterality: Left

## 2019-02-27 MED ORDER — LIDOCAINE 2% (20 MG/ML) 5 ML SYRINGE
INTRAMUSCULAR | Status: DC | PRN
  Administered 2019-02-27: 40 mg via INTRAVENOUS

## 2019-02-27 MED ORDER — LACTATED RINGERS IV SOLN
INTRAVENOUS | Status: DC
  Administered 2019-02-27: 11:00:00 via INTRAVENOUS

## 2019-02-27 MED ORDER — LACTATED RINGERS IV SOLN
INTRAVENOUS | Status: DC | PRN
  Administered 2019-02-27: 11:00:00 via INTRAVENOUS

## 2019-02-27 MED ORDER — PROPOFOL 500 MG/50ML IV EMUL
INTRAVENOUS | Status: DC | PRN
  Administered 2019-02-27: 100 ug/kg/min via INTRAVENOUS

## 2019-02-27 MED ORDER — AMLODIPINE BESYLATE 5 MG PO TABS
5.0000 mg | ORAL_TABLET | Freq: Every day | ORAL | Status: DC
  Administered 2019-02-27 – 2019-03-02 (×4): 5 mg via ORAL
  Filled 2019-02-27 (×4): qty 1

## 2019-02-27 MED ORDER — LACTATED RINGERS IV BOLUS
500.0000 mL | Freq: Once | INTRAVENOUS | Status: AC
  Administered 2019-02-27: 500 mL via INTRAVENOUS

## 2019-02-27 MED ORDER — PROPOFOL 10 MG/ML IV BOLUS
INTRAVENOUS | Status: DC | PRN
  Administered 2019-02-27 (×2): 20 mg via INTRAVENOUS

## 2019-02-27 SURGICAL SUPPLY — 15 items

## 2019-02-27 NOTE — Transfer of Care (Signed)
Immediate Anesthesia Transfer of Care Note  Patient: [name]  Procedure(s) Performed: ESOPHAGOGASTRODUODENOSCOPY (EGD) WITH PROPOFOL (Left )  Patient Location: Endoscopy Unit  Anesthesia Type:MAC  Level of Consciousness: awake, alert  and oriented  Airway & Oxygen Therapy: Patient Spontanous Breathing and Patient connected to nasal cannula oxygen  Post-op Assessment: Report given to RN, Post -op Vital signs reviewed and stable and Patient moving all extremities  Post vital signs: Reviewed and stable  Last Vitals:  Vitals Value Taken Time  BP    Temp    Pulse 72 02/27/2019 11:45 AM  Resp 18 02/27/2019 11:45 AM  SpO2 98 % 02/27/2019 11:45 AM  Vitals shown include unvalidated device data.  Last Pain:  Vitals:   02/27/19 1048  TempSrc: Oral  PainSc: 0-No pain      Patients Stated Pain Goal: 0 (38/87/19 5974)  Complications: No apparent anesthesia complications

## 2019-02-27 NOTE — Op Note (Signed)
[facility] Patient Name: [name] Procedure Date : 02/27/2019 MRN: [id number] Attending MD: Arta Silence , MD Date of Birth: 1941-08-06 CSN: [id number] Age: 78 Admit Type: Inpatient Procedure:                Upper GI endoscopy Indications:              Iron deficiency anemia, Suspected upper                            gastrointestinal bleeding, Suspected upper                            gastrointestinal bleeding in patient with chronic                            blood loss, Gastrointestinal bleeding of unknown                            origin Providers:                Arta Silence, MD, Raynelle Bring, RN, Elspeth Cho Tech., Technician, Ladona Ridgel,                            Technician Referring MD:             [facility] Medicines:                Monitored Anesthesia Care Complications:            No immediate complications. Estimated Blood Loss:     Estimated blood loss: none. Procedure:                Pre-Anesthesia Assessment:                           - Prior to the procedure, a History and Physical                            was performed, and patient medications and                            allergies were reviewed. [name] tolerance of                            previous anesthesia was also reviewed. The risks                            and benefits of the procedure and the sedation                            options and risks were discussed with the patient.                            All questions were answered, and informed consent  was obtained. Prior Anticoagulants: The patient has                            taken heparin, last dose was day of procedure. ASA                            Grade Assessment: III - A patient with severe                            systemic disease. After reviewing the risks and                            benefits, the patient was deemed in  satisfactory                            condition to undergo the procedure.                           After obtaining informed consent, the endoscope was                            passed under direct vision. Throughout the                            procedure, the patient's blood pressure, pulse, and                            oxygen saturations were monitored continuously. The                            GIF-H190 ([id number]) Olympus gastroscope was                            introduced through the mouth, and advanced to the                            second part of duodenum. The upper GI endoscopy was                            accomplished without difficulty. The patient                            tolerated the procedure well. Scope In: Scope Out: Findings:      A small hiatal hernia was present.      The exam of the esophagus was otherwise normal.      Patchy mildly friable mucosa with contact bleeding was found in the       gastric fundus, in the gastric body and in the gastric antrum.      The exam of the stomach was otherwise normal.      The duodenal bulb, first portion of the duodenum and second portion of       the duodenum were normal. Impression:               - Small hiatal hernia.                           -  Friable gastric mucosa.                           - Normal duodenal bulb, first portion of the                            duodenum and second portion of the duodenum.                           - Overall, patient likely has some degree of                            generalized mucosal friability which, over time,                            could lead to blood loss. However, as this is a                            patchy and more generalized process, it is not                            amenable to endoscopic intervention. Patient may                            also have, for the same reason, some small bowel                            oozing that has not been  appreciated. Moderate Sedation:      None Recommendation:           - Return patient to hospital ward for ongoing care.                           - Clear liquid diet today.                           - Continue present medications.                           - PPI.                           [facility] will follow.                           - Hematology consultation for further work-up of                            anemia, to assess for non-GI causes and assess for                            possibility of IV iron.                           - Heparin now. Hold off on warfarin for now,  pending further work-up by hematology. Procedure Code(s):        --- Professional ---                           [phone], Esophagogastroduodenoscopy, flexible,                            transoral; diagnostic, including collection of                            specimen(s) by brushing or washing, when performed                            (separate procedure) Diagnosis Code(s):        --- Professional ---                           K44.9, Diaphragmatic hernia without obstruction or                            gangrene                           K31.89, Other diseases of stomach and duodenum                           D50.9, Iron deficiency anemia, unspecified                           R58, Hemorrhage, not elsewhere classified                           K92.2, Gastrointestinal hemorrhage, unspecified CPT copyright 2019 [employer]. All rights reserved. The codes documented in this report are preliminary and upon coder review may  be revised to meet current compliance requirements. Arta Silence, MD 02/27/2019 12:00:30 PM This report has been signed electronically. Number of Addenda: 0

## 2019-02-27 NOTE — Progress Notes (Signed)
Progress Note  Patient Name: [name] Date of Encounter: 02/27/2019  Primary Cardiologist: Quay Burow, MD   Subjective   Doing well. INR dropped precipitously to 1.7 and was able to have EGD today. No active sources of bleeding identified and Dr. Paulita Fujita recommended Fe infusion and Hematology evaluation. On IV heparin.  Inpatient Medications    Scheduled Meds: . ALPRAZolam  0.25 mg Oral QHS  . amLODipine  5 mg Oral Daily  . atorvastatin  80 mg Oral q1800  . Chlorhexidine Gluconate Cloth  6 each Topical Daily  . docusate sodium  100 mg Oral BID  . furosemide  80 mg Oral Daily  . hydrALAZINE  100 mg Oral TID  . insulin aspart  0-9 Units Subcutaneous TID WC  . irbesartan  300 mg Oral Daily  . magnesium oxide  400 mg Oral QPM  . mupirocin ointment  1 application Nasal BID  . pantoprazole  40 mg Oral BID  . sodium chloride flush  3 mL Intravenous Q12H  . tadalafil  20 mg Oral BID  . Warfarin - Pharmacist Dosing Inpatient   Does not apply q1800   Continuous Infusions: . heparin 1,100 Units/hr (02/27/19 1257)   PRN Meds: acetaminophen **OR** acetaminophen, ondansetron **OR** ondansetron (ZOFRAN) IV   Vital Signs    Vitals:   02/27/19 1145 02/27/19 1146 02/27/19 1155 02/27/19 1300  BP:  (!) 162/33 (!) 189/37 (!) 160/34  Pulse: 72 72 65 71  Resp: 18 18 15 20   Temp: 98.1 F (36.7 C)   98.6 F (37 C)  TempSrc: Oral     SpO2: 98% 98% 97% 97%  Weight:      Height:        Intake/Output Summary (Last 24 hours) at 02/27/2019 1411 Last data filed at 02/27/2019 1146 Gross per 24 hour  Intake 559.01 ml  Output 220 ml  Net 339.01 ml   Last 3 Weights 02/26/2019 12/11/2018 11/22/2018  Weight (lbs) 129 lb 131 lb 12.8 oz 128 lb 4.8 oz  Weight (kg) 58.514 kg 59.784 kg 58.196 kg      Telemetry    NSR - Personally Reviewed  ECG    NSR - Personally Reviewed  Physical Exam  Appears pale, but otherwise well GEN: No acute distress.   Neck: No JVD Cardiac: RRR,  crisp mechanical valve clicks, ejection/flow murmur, no diastolic murmurs, rubs, or gallops.  Respiratory: Clear to auscultation bilaterally. GI: Soft, nontender, non-distended  MS: No edema; No deformity. Neuro:  Nonfocal  Psych: Normal affect   Labs    Chemistry Recent Labs  Lab 02/25/19 0312 02/26/19 0309 02/27/19 0038  NA 134* 135 132*  K 3.9 4.0 3.9  CL 105 104 104  CO2 21* 22 19*  GLUCOSE 105* 117* 249*  BUN 45* 42* 40*  CREATININE 1.21* 1.35* 1.34*  CALCIUM 8.9 8.6* 8.4*  PROT 6.2*  --   --   ALBUMIN 3.0*  --   --   AST 19  --   --   ALT 11  --   --   ALKPHOS 69  --   --   BILITOT 1.2  --   --   GFRNONAA 42* 37* 37*  GFRAA 49* 43* 43*  ANIONGAP 8 9 9      Hematology Recent Labs  Lab 02/25/19 0312 02/25/19 1613 02/26/19 0309 02/27/19 0038  WBC 3.3*  --  3.1* 3.1*  RBC 2.54*  --  2.51* 2.35*  HGB 7.4* 8.2* 7.4* 7.0*  HCT 22.5* 24.7* 22.5* 21.2*  MCV 88.6  --  89.6 90.2  MCH 29.1  --  29.5 29.8  MCHC 32.9  --  32.9 33.0  RDW 15.4  --  15.1 14.9  PLT 162  --  167 146*    Cardiac Enzymes Recent Labs  Lab 02/24/19 1427  TROPONINI <0.03   No results for input(s): TROPIPOC in the last 168 hours.   BNPNo results for input(s): BNP, PROBNP in the last 168 hours.   DDimer No results for input(s): DDIMER in the last 168 hours.   Radiology    No results found.  Cardiac Studies     Right Heart Catheterization 06/27/2018: 1. Mildly elevated PCWP, normal RA pressure.  2. Preserved cardiac output.  3. Moderate pulmonary arterial hypertension. PVR is only 3.1 WU due to wide PA pulse pressure.   Send V/Q scan to rule out chronic PEs, arrange for PFTs. Will need to consider treatment of PAH given RV dysfunction by echo.  _______________  Echocardiogram 02/24/2018: Study Conclusions: - Left ventricle: The cavity size was normal. Wall thickness was normal. Systolic function was normal. The estimated ejection fraction was in the range of 55% to  60%. Wall motion was normal; there were no regional wall motion abnormalities. Doppler parameters are consistent with a reversible restrictive pattern, indicative of decreased left ventricular diastolic compliance and/or increased left atrial pressure (grade 3 diastolic dysfunction). - Ventricular septum: The contour showed diastolic flattening. - Aortic valve: A mechanical prosthesis was present and functioning normally. Peak velocity (S): 238 cm/s. Mean gradient (S): 10 mm Hg. Valve area (VTI): 2.24 cm^2. Valve area (Vmax): 2.26 cm^2. Valve area (Vmean): 1.88 cm^2. - Mitral valve: Calcified annulus. There was mild regurgitation. Valve area by continuity equation (using LVOT flow): 2.53 cm^2. - Left atrium: The atrium was moderately to severely dilated. - Right ventricle: The cavity size was moderately dilated. Wall thickness was normal. Systolic function was moderately to severely reduced. - Right atrium: The atrium was severely dilated. - Tricuspid valve: There was severe regurgitation. - Pulmonary arteries: Systolic pressure was severely increased. PA peak pressure: 88 mm Hg (S).  Impressions: - When compared to prior, PA pressures have increased.  Patient Profile     78 y.o. female with recurrent problems due to acute symptomatic iron deficiency anemia, chronic warfarin anticoagulation for mechanical AVR, chronic diastolic heart failure, paroxysmal atrial fibrillation, moderate to severe PAH (possibly due to collagen vascular disease)  Assessment & Plan    Acute Symptomatic Anemia - Patient presented with hemoglobin of 6.6,  received 1 unit of PRBCs with improvement. Hemoglobin up to 8.2 on 5/19 but back down to 7.0 today. - Hemoccult negative. EGD without overt cause for blood loss. - considering Fe infusion and Hematology evaluation  Mechanical Aortic Valve - Patient underwent CABG x2 with mechanical aortic valve replacement in 07/2004. She is due  a reevaluation of the valve. - echo pending. - currently on IV heparin until anemia evaluation is completed   CAD s/p CABG in 2005 - denies angina, no ECG changes, despite anemia  Paroxsymal Atrial Fibrillation - currently in sinus rhythm   Chronic Diastolic CHF - LVEF of 52-84% on last Echo in 02/2018. Clinically euvolemic, dyspnea is related to anemia   Pulmonary Hypertension - Right heart cath in 06/2018 showed moderate pulmonary arterial hypertension with PVR of only 3.1 due to wide PA pulse pressure. - PFTs in 07/2018 were unremarkable. - Continue Adcirca 40mg  daily. - Per Dr. Claris Gladden last  office visit note, "possible group 1 PH component though this is a borderline call as PVR is not significantly elevated. ANA and RF positive, as is CCP Ab. Possible rheumatoid arthritis. CT chest showed subtle bilateral ground glass opacities in 3/20, this could be due to RA lung effects."     For questions or updates, please contact [place] Please consult [email or web address] for contact info under        Signed, Sanda Klein, MD  02/27/2019, 2:11 PM

## 2019-02-27 NOTE — Anesthesia Postprocedure Evaluation (Signed)
Anesthesia Post Note  Patient: [name]  Procedure(s) Performed: ESOPHAGOGASTRODUODENOSCOPY (EGD) WITH PROPOFOL (Left )     Patient location during evaluation: Endoscopy Anesthesia Type: MAC Level of consciousness: awake and alert Pain management: pain level controlled Vital Signs Assessment: post-procedure vital signs reviewed and stable Respiratory status: spontaneous breathing, nonlabored ventilation, respiratory function stable and patient connected to nasal cannula oxygen Cardiovascular status: stable and blood pressure returned to baseline Postop Assessment: no apparent nausea or vomiting Anesthetic complications: no    Last Vitals:  Vitals:   02/27/19 1155 02/27/19 1300  BP: (!) 189/37 (!) 160/34  Pulse: 65 71  Resp: 15 20  Temp:  37 C  SpO2: 97% 97%    Last Pain:  Vitals:   02/27/19 1300  TempSrc:   PainSc: 0-No pain                 Laketra Bowdish COKER

## 2019-02-27 NOTE — Progress Notes (Signed)
ANTICOAGULATION CONSULT NOTE - Follow-Up Consult  Pharmacy Consults for Warfarin, Heparin Indication: Hx mech AVR + Afib  Vital Signs: Temp: 98.6 F (37 C) (05/20 2303) Temp Source: Oral (05/20 2303) BP: 149/45 (05/20 2303) Pulse Rate: 85 (05/20 2303)   Heparin Dosing Wt:  58.5 kg  Labs: Recent Labs    02/24/19 1427  02/25/19 0312 02/25/19 1613 02/26/19 0309 02/26/19 1526 02/27/19 0038  HGB 7.0*  --  7.4* 8.2* 7.4*  --  7.0*  HCT 22.4*  --  22.5* 24.7* 22.5*  --  21.2*  PLT 186  --  162  --  167  --  146*  LABPROT  --    < > 31.3*  --  28.2* 21.7* 19.8*  INR  --    < > 3.1*  --  2.7* 1.9* 1.7*  HEPARINUNFRC  --   --   --   --   --   --  0.10*  CREATININE 1.40*  --  1.21*  --  1.35*  --  1.34*  TROPONINI <0.03  --   --   --   --   --   --    < > = values in this interval not displayed.    Estimated Creatinine Clearance: 27.5 mL/min (A) (by C-G formula based on SCr of 1.34 mg/dL (H)).   Medical History: Past Medical History:  Diagnosis Date  . Aortic atherosclerosis ([place]) 01/23/2018  . Atrial fibrillation ([place])   . Carotid artery disease ([place])   . Cholelithiasis 01/23/2018  . Chronic anticoagulation   . Coronary artery disease    status post coronary artery bypass grafting times 07/10/2004  . Diabetes mellitus   . Hypercholesteremia   . Hypertension   . Mechanical heart valve present    H. aortic valve replacement at the time of bypass surgery October 2005  . Moderate to severe pulmonary hypertension ([place])   . Peripheral arterial disease ([facility])    history of left common iliac artery PTA and stenting for a chronic total occlusion 08/26/01    Assessment: 78 yr old female on warfarin PTA for hx mech AVR + Afib. Per chart review - noted INR goal reduction from 2.5-3.5 to 2-3 in June 2019. Admit INR 3.2 on PTA dose, noted symptomatic anemia and recent GIB (2/20 EGD: oozing superficial gastric ulcers). Pharmacy consulted for warfarin and heparin dosing this admission.  EGD planned for tomorrow if INR remains <2.0.  PTA warfarin dose: 7.5 mg daily except 5 mg M/F.  Heparin level subtherapeutic at 0.1, rate confirmed and no infusion issues per RN  Goals of Therapy: INR goal (per MD notes this admission): 2.5-3.5 Heparin level:  0.3-0.7  Monitor platelets by anticoagulation protocol: Yes   Plan:  Increase heparin gtt to 1050 units/hr F/u 8 hour heparin level  Bertis Ruddy, PharmD Clinical Pharmacist Please check AMION for all [place] numbers 02/27/2019 1:29 AM

## 2019-02-27 NOTE — Patient Outreach (Signed)
[facility]) Care Management  02/27/2019  DORRINE MONTONE January 13, 1941 [id number]    RN Health coach notified that the patient was admitted to the hospital on 02/24/19 for symptomatic Anemia.  Hospital liaison will follow the patient.  Plan:  RN Health Coach will close the case at this time due to admission.  RN Health Coach will send discipline closure letter to the physician.  Lazaro Arms RN, BSN, [place] Direct Dial:  [phone]  Fax: [phone]

## 2019-02-27 NOTE — Interval H&P Note (Signed)
History and Physical Interval Note:  02/27/2019 11:08 AM  [name]  has presented today for surgery, with the diagnosis of anemia, melena.  The various methods of treatment have been discussed with the patient and family. After consideration of risks, benefits and other options for treatment, the patient has consented to  Procedure(s): ESOPHAGOGASTRODUODENOSCOPY (EGD) WITH PROPOFOL (Left) as a surgical intervention.  [name] history has been reviewed, patient examined, no change in status, stable for surgery.  I have reviewed the patient's chart and labs.  Questions were answered to the patient's satisfaction.     Landry Dyke

## 2019-02-27 NOTE — Progress Notes (Signed)
ANTICOAGULATION CONSULT NOTE - Follow-Up Consult  Pharmacy Consults for Warfarin, Heparin Indication: Hx mech AVR + Afib  Vital Signs: Temp: 98.1 F (36.7 C) (05/21 1145) Temp Source: Oral (05/21 1145) BP: 189/37 (05/21 1155) Pulse Rate: 65 (05/21 1155)   Heparin Dosing Wt:  58.5 kg  Labs: Recent Labs    02/24/19 1427  02/25/19 0312 02/25/19 1613 02/26/19 0309 02/26/19 1526 02/27/19 0038 02/27/19 0947  HGB 7.0*  --  7.4* 8.2* 7.4*  --  7.0*  --   HCT 22.4*  --  22.5* 24.7* 22.5*  --  21.2*  --   PLT 186  --  162  --  167  --  146*  --   LABPROT  --    < > 31.3*  --  28.2* 21.7* 19.8*  --   INR  --    < > 3.1*  --  2.7* 1.9* 1.7*  --   HEPARINUNFRC  --   --   --   --   --   --  0.10* 0.30  CREATININE 1.40*  --  1.21*  --  1.35*  --  1.34*  --   TROPONINI <0.03  --   --   --   --   --   --   --    < > = values in this interval not displayed.    Estimated Creatinine Clearance: 27.5 mL/min (A) (by C-G formula based on SCr of 1.34 mg/dL (H)).   Medical History: Past Medical History:  Diagnosis Date  . Aortic atherosclerosis ([place]) 01/23/2018  . Atrial fibrillation ([place])   . Carotid artery disease ([place])   . Cholelithiasis 01/23/2018  . Chronic anticoagulation   . Coronary artery disease    status post coronary artery bypass grafting times 07/10/2004  . Diabetes mellitus   . Hypercholesteremia   . Hypertension   . Mechanical heart valve present    H. aortic valve replacement at the time of bypass surgery October 2005  . Moderate to severe pulmonary hypertension ([place])   . Peripheral arterial disease (HCC)    history of left common iliac artery PTA and stenting for a chronic total occlusion 08/26/01    Assessment: 78 yr old female on warfarin PTA for hx mech AVR + Afib. Per chart review - noted INR goal reduction from 2.5-3.5 to 2-3 in June 2019. Admit INR 3.2 on PTA dose, noted symptomatic anemia and recent GIB (2/20 EGD: oozing superficial gastric ulcers). Pharmacy  consulted for warfarin and heparin dosing this admission. EGD now finished. May continue heparin per GI (warfarin on hold)  PTA warfarin dose: 7.5 mg daily except 5 mg M/F.  Heparin level therapeutic at 0.3,Heparin level drawn prior to EGD. Heparin held for EGD, but restarted after procedure. Since heparin level is at the low end of the range, will increase slightly  Goals of Therapy: INR goal (per MD notes this admission): 2.5-3.5 Heparin level:  0.3-0.7  Monitor platelets by anticoagulation protocol: Yes   Plan:  Increase heparin gtt to 1100 units/hr F/u 8 hour heparin level Hold warfarin for now per GI for further w/u  Strider Vallance A. Levada Dy, PharmD, [place] Please utilize Amion for appropriate phone number to reach the unit pharmacist ([place])   02/27/2019 12:33 PM

## 2019-02-27 NOTE — Anesthesia Preprocedure Evaluation (Signed)
Anesthesia Evaluation  Patient identified by MRN, date of birth, ID band Patient awake    Reviewed: Allergy & Precautions, NPO status , Patient's Chart, lab work & pertinent test results  Airway Mallampati: II       Dental  (+) Edentulous Upper, Edentulous Lower   Pulmonary former smoker,    breath sounds clear to auscultation       Cardiovascular hypertension,  Rhythm:Regular Rate:Normal     Neuro/Psych    GI/Hepatic   Endo/Other  diabetes  Renal/GU      Musculoskeletal   Abdominal   Peds  Hematology   Anesthesia Other Findings   Reproductive/Obstetrics                             Anesthesia Physical Anesthesia Plan  ASA: III  Anesthesia Plan: MAC   Post-op Pain Management:    Induction:   PONV Risk Score and Plan:   Airway Management Planned: Natural Airway and Nasal Cannula  Additional Equipment:   Intra-op Plan:   Post-operative Plan: Extubation in OR  Informed Consent:   Plan Discussed with: CRNA and Anesthesiologist  Anesthesia Plan Comments:         Anesthesia Quick Evaluation

## 2019-02-27 NOTE — Progress Notes (Signed)
ANTICOAGULATION CONSULT NOTE - Follow-Up Consult  Pharmacy Consults for Warfarin, Heparin Indication: Hx mech AVR + Afib  Vital Signs: Temp: 101.8 F (38.8 C) (05/21 2200) Temp Source: Rectal (05/21 1929) BP: 129/39 (05/21 2200) Pulse Rate: 87 (05/21 2200)   Heparin Dosing Wt:  58.5 kg  Labs: Recent Labs    02/25/19 0312 02/25/19 1613 02/26/19 0309 02/26/19 1526 02/27/19 0038 02/27/19 0947 02/27/19 2139  HGB 7.4* 8.2* 7.4*  --  7.0*  --   --   HCT 22.5* 24.7* 22.5*  --  21.2*  --   --   PLT 162  --  167  --  146*  --   --   LABPROT 31.3*  --  28.2* 21.7* 19.8*  --   --   INR 3.1*  --  2.7* 1.9* 1.7*  --   --   HEPARINUNFRC  --   --   --   --  0.10* 0.30 0.11*  CREATININE 1.21*  --  1.35*  --  1.34*  --   --     Estimated Creatinine Clearance: 27.5 mL/min (A) (by C-G formula based on SCr of 1.34 mg/dL (H)).   Medical History: Past Medical History:  Diagnosis Date  . Aortic atherosclerosis ([place]) 01/23/2018  . Atrial fibrillation ([place])   . Carotid artery disease ([place])   . Cholelithiasis 01/23/2018  . Chronic anticoagulation   . Coronary artery disease    status post coronary artery bypass grafting times 07/10/2004  . Diabetes mellitus   . Hypercholesteremia   . Hypertension   . Mechanical heart valve present    H. aortic valve replacement at the time of bypass surgery October 2005  . Moderate to severe pulmonary hypertension ([place])   . Peripheral arterial disease (H[facility])    history of left common iliac artery PTA and stenting for a chronic total occlusion 08/26/01    Assessment: 78 yr old female on warfarin PTA for hx mech AVR + Afib. Per chart review - noted INR goal reduction from 2.5-3.5 to 2-3 in June 2019. Admit INR 3.2 on PTA dose, noted symptomatic anemia and recent GIB (2/20 EGD: oozing superficial gastric ulcers). Pharmacy consulted for warfarin and heparin dosing this admission. EGD now finished. May continue heparin per GI (warfarin on hold)  PTA warfarin  dose: 7.5 mg daily except 5 mg M/F.  Heparin restarted after EGD today drip rate 1100 uts/hr HL 0.11 < goal   Goals of Therapy: INR goal (per MD notes this admission): 2.5-3.5 Heparin level:  0.3-0.7  Monitor platelets by anticoagulation protocol: Yes   Plan:  Increase heparin drip 1200 uts/hr Daily HL, CBC, Protime  Bonnita Nasuti Pharm.D. CPP, BCPS Clinical Pharmacist [phone] 02/27/2019 10:10 PM

## 2019-02-27 NOTE — Progress Notes (Signed)
[facility]                                                                              Patient Demographics  Lexington Devine, is a 78 y.o. female, DOB - August 28, 1941, UUV:[id number]  Admit date - 02/24/2019   Admitting Physician Karmen Bongo, MD  Outpatient Primary MD for the patient is [name], [name], MD  Outpatient specialists:   LOS - 2  days   Medical records reviewed and are as summarized below:    Chief Complaint  Patient presents with  . Abnormal Lab       Brief summary   LEZLIE RITCHEY is an 78 y.o. female with medical history significant ofPAD s/p stenting; moderate to severe pulmonary HTN; afib and AVR on Coumadin; HTN; HLD; DM; and CAD s/p CABG presenting with fatigue.Her GI doctor called and told her her blood was 6.6.  Patient was admitted on 2/20 with symptomatic anemia due to upper GI bleeding.   Assessment & Plan    Principal Problem: Acute on chronic symptomatic anemia -Recent admission in 2/20 with anemia, EGD had shown oozing superficial gastric ulcers. -Continues to have recurrent anemia, on Coumadin for mechanical valve.  Had received 1 unit packed RBCs. -Coumadin currently on hold, underwent EGD today.  EGD showed patchy mildly friable mucosa with contact bleeding in the gastric fundus otherwise normal duodenum, small hiatal hernia.  GI does not feel acute competent of anemia was from GI source.  Recommended hematology consult, Dr. Paulita Fujita has discussed with Dr. Jana Hakim. -will continue heparin drip and hold Coumadin for now until hematology recommendations.  Hemoglobin down to 7.0 again.  Recheck in a.m., may need packed RBC transfusion.  Active problems Chronic atrial fibrillation, AVR with mechanical valve, 2005 -Continue to hold Coumadin, INR goal 2.5-3.5 -Continue heparin drip   Chronic diastolic CHF -2D echo in 4/74 showed preserved EF and grade 3 diastolic dysfunction -Has mild lower extremity edema left worse than  right, Lasix has been restarted   Essential hypertension BP somewhat elevated, continue hydralazine, Avapro, Lasix.  On max doses. Allergic to Coreg/beta-blockers.  Will try Norvasc 5 mg daily  History of pulmonary hypertension Cardiac cath in 9/19 had shown moderate pulmonary hypertension Patient was started on Adcirca but not clear how much benefit she had, a second medication was not started Repeat echo scheduled for 7/7, has been referred to rheumatology as well   CKD stage III Baseline creatinine 1.2-1.4 Currently at baseline  Diabetes mellitus, type II with complications, CKD -Continue sensitive sliding scale  Code Status: Full code DVT Prophylaxis: SCDs Family Communication: Discussed in detail with the patient, all imaging results, lab results explained to the patient    Disposition Plan: Awaiting hematology evaluation,  Time Spent in minutes 35 minutes  Procedures:  EGD  Consultants:   GI, cardiology Hematology  Antimicrobials:   Anti-infectives (From admission, onward)   None         Medications  Scheduled Meds: . ALPRAZolam  0.25 mg Oral QHS  . atorvastatin  80 mg Oral q1800  . Chlorhexidine Gluconate Cloth  6 each Topical Daily  . docusate sodium  100 mg Oral BID  . furosemide  80 mg Oral Daily  . hydrALAZINE  100 mg Oral TID  . insulin aspart  0-9 Units Subcutaneous TID WC  . irbesartan  300 mg Oral Daily  . magnesium oxide  400 mg Oral QPM  . mupirocin ointment  1 application Nasal BID  . pantoprazole  40 mg Oral BID  . sodium chloride flush  3 mL Intravenous Q12H  . tadalafil  20 mg Oral BID  . Warfarin - Pharmacist Dosing Inpatient   Does not apply q1800   Continuous Infusions: . heparin 1,100 Units/hr (02/27/19 1257)   PRN Meds:.acetaminophen **OR** acetaminophen, ondansetron **OR** ondansetron (ZOFRAN) IV      Subjective:   Lashawne Dura was seen and examined today.  Somewhat frustrated over anemia, " where is the blood  going?". Patient denies dizziness, chest pain, shortness of breath, abdominal pain, N/V/D/C, new weakness, numbess, tingling. No acute events overnight.    Objective:   Vitals:   02/27/19 1145 02/27/19 1146 02/27/19 1155 02/27/19 1300  BP:  (!) 162/33 (!) 189/37 (!) 160/34  Pulse: 72 72 65 71  Resp: 18 18 15 20   Temp: 98.1 F (36.7 C)   98.6 F (37 C)  TempSrc: Oral     SpO2: 98% 98% 97% 97%  Weight:      Height:        Intake/Output Summary (Last 24 hours) at 02/27/2019 1406 Last data filed at 02/27/2019 1146 Gross per 24 hour  Intake 559.01 ml  Output 220 ml  Net 339.01 ml     Wt Readings from Last 3 Encounters:  02/26/19 58.5 kg  12/11/18 59.8 kg  11/22/18 58.2 kg   Physical Exam  General: Alert and oriented x 3, NAD  Eyes:   HEENT:  Atraumatic, normocephalic  Cardiovascular: S1 S2 clear, no murmurs, RRR.1+ pedal edema b/l  Respiratory: CTAB, no wheezing, rales or rhonchi  Gastrointestinal: Soft, nontender, nondistended, NBS  Ext: 1+ pedal edema bilaterally  Neuro: no new deficits  Musculoskeletal: No cyanosis, clubbing  Skin: No rashes  Psych: Normal affect and demeanor, alert and oriented x3     Data Reviewed:  I have personally reviewed following labs and imaging studies  Micro Results Recent Results (from the past 240 hour(s))  SARS Coronavirus 2 (CEPHEID - Performed in [place] hospital lab), Hosp Order     Status: None   Collection Time: 02/24/19  2:27 PM  Result Value Ref Range Status   SARS Coronavirus 2 NEGATIVE NEGATIVE Final    Comment: (NOTE) If result is NEGATIVE SARS-CoV-2 target nucleic acids are NOT DETECTED. The SARS-CoV-2 RNA is generally detectable in upper and lower  respiratory specimens during the acute phase of infection. The lowest  concentration of SARS-CoV-2 viral copies this assay can detect is 250  copies / mL. A negative result does not preclude SARS-CoV-2 infection  and should not be used as the sole basis for  treatment or other  patient management decisions.  A negative result may occur with  improper specimen collection / handling, submission of specimen other  than nasopharyngeal swab, presence of viral mutation(s) within the  areas targeted by this assay, and inadequate number of viral copies  (<250 copies / mL). A negative result must be combined with clinical  observations, patient history, and epidemiological information. If result is POSITIVE SARS-CoV-2 target nucleic acids are DETECTED. The SARS-CoV-2 RNA is generally detectable in upper and lower  respiratory specimens dur ing the acute  phase of infection.  Positive  results are indicative of active infection with SARS-CoV-2.  Clinical  correlation with patient history and other diagnostic information is  necessary to determine patient infection status.  Positive results do  not rule out bacterial infection or co-infection with other viruses. If result is PRESUMPTIVE POSTIVE SARS-CoV-2 nucleic acids MAY BE PRESENT.   A presumptive positive result was obtained on the submitted specimen  and confirmed on repeat testing.  While 2019 novel coronavirus  (SARS-CoV-2) nucleic acids may be present in the submitted sample  additional confirmatory testing may be necessary for epidemiological  and / or clinical management purposes  to differentiate between  SARS-CoV-2 and other Sarbecovirus currently known to infect humans.  If clinically indicated additional testing with an alternate test  methodology [phone]) is advised. The SARS-CoV-2 RNA is generally  detectable in upper and lower respiratory sp ecimens during the acute  phase of infection. The expected result is Negative. Fact Sheet for Patients:  StrictlyIdeas.no Fact Sheet for Healthcare Providers: BankingDealers.co.za This test is not yet approved or cleared by the [place] FDA and has been authorized for detection and/or  diagnosis of SARS-CoV-2 by FDA under an Emergency Use Authorization (EUA).  This EUA will remain in effect (meaning this test can be used) for the duration of the COVID-19 declaration under Section 564(b)(1) of the Act, 21 U.S.C. section 360bbb-3(b)(1), unless the authorization is terminated or revoked sooner. Performed at [facility], [place] [address], [place], [place] [address]   Surgical PCR screen     Status: Abnormal   Collection Time: 02/26/19  9:01 PM  Result Value Ref Range Status   MRSA, PCR NEGATIVE NEGATIVE Final   Staphylococcus aureus POSITIVE (A) NEGATIVE Final    Comment: (NOTE) The Xpert SA Assay (FDA approved for NASAL specimens in patients 53 years of age and older), is one component of a comprehensive surveillance program. It is not intended to diagnose infection nor to guide or monitor treatment. Performed at [facility], [place] [address], [place], [place] [address]     Radiology Reports No results found.  Lab Data:  CBC: Recent Labs  Lab 02/24/19 1427 02/25/19 0312 02/25/19 1613 02/26/19 0309 02/27/19 0038  WBC 3.9* 3.3*  --  3.1* 3.1*  NEUTROABS 2.9  --   --   --   --   HGB 7.0* 7.4* 8.2* 7.4* 7.0*  HCT 22.4* 22.5* 24.7* 22.5* 21.2*  MCV 94.1 88.6  --  89.6 90.2  PLT 186 162  --  167 829*   Basic Metabolic Panel: Recent Labs  Lab 02/24/19 1427 02/25/19 0312 02/26/19 0309 02/27/19 0038  NA 134* 134* 135 132*  K 3.9 3.9 4.0 3.9  CL 102 105 104 104  CO2 18* 21* 22 19*  GLUCOSE 117* 105* 117* 249*  BUN 50* 45* 42* 40*  CREATININE 1.40* 1.21* 1.35* 1.34*  CALCIUM 9.2 8.9 8.6* 8.4*   GFR: Estimated Creatinine Clearance: 27.5 mL/min (A) (by C-G formula based on SCr of 1.34 mg/dL (H)). Liver Function Tests: Recent Labs  Lab 02/25/19 0312  AST 19  ALT 11  ALKPHOS 69  BILITOT 1.2  PROT 6.2*  ALBUMIN 3.0*   No results for input(s): LIPASE, AMYLASE in the last 168 hours. No results for input(s): AMMONIA in the last 168  hours. Coagulation Profile: Recent Labs  Lab 02/24/19 1609 02/25/19 0312 02/26/19 0309 02/26/19 1526 02/27/19 0038  INR 3.2* 3.1* 2.7* 1.9* 1.7*   Cardiac  Enzymes: Recent Labs  Lab 02/24/19 1427  TROPONINI <0.03   BNP (last 3 results) No results for input(s): PROBNP in the last 8760 hours. HbA1C: Recent Labs    02/24/19 1730  HGBA1C 4.6*   CBG: Recent Labs  Lab 02/26/19 1223 02/26/19 1656 02/26/19 2239 02/27/19 0830 02/27/19 1250  GLUCAP 287* 123* 206* 117* 116*   Lipid Profile: No results for input(s): CHOL, HDL, LDLCALC, TRIG, CHOLHDL, LDLDIRECT in the last 72 hours. Thyroid Function Tests: No results for input(s): TSH, T4TOTAL, FREET4, T3FREE, THYROIDAB in the last 72 hours. Anemia Panel: No results for input(s): VITAMINB12, FOLATE, FERRITIN, TIBC, IRON, RETICCTPCT in the last 72 hours. Urine analysis:    Component Value Date/Time   COLORURINE YELLOW 02/24/2018 [place] 02/24/2018 1621   LABSPEC <1.005 (L) 02/24/2018 1621   PHURINE 5.5 02/24/2018 1621   GLUCOSEU NEGATIVE 02/24/2018 1621   HGBUR SMALL (A) 02/24/2018 1621   BILIRUBINUR NEGATIVE 02/24/2018 1621   KETONESUR NEGATIVE 02/24/2018 1621   PROTEINUR NEGATIVE 02/24/2018 1621   UROBILINOGEN 1.0 10/06/2011 1709   NITRITE NEGATIVE 02/24/2018 1621   LEUKOCYTESUR NEGATIVE 02/24/2018 1621     Ripudeep Rai M.D. [facility] 02/27/2019, 2:06 PM  Pager: [phone] Between 7am to 7pm - call Pager - [phone]  After 7pm go to [email or web address] - password [email or web address]  Call night coverage person covering after 7pm

## 2019-02-27 NOTE — Consult Note (Addendum)
[name]  [phone] [phone]) [phone]  ID: [name] DOB: 1941/05/19 MR#: [id number] FAO#:[id number] PCP: [name], MD  CHIEF COMPLAINT: Anemia  INTERVAL HISTORY: [name] is a 78 year old female from [place], [place].  She has a past medical history significant for PAD s/p stenting; moderate to severe pulmonary HTN; afib and AVR on Coumadin; HTN; HLD; DM; and CAD s/p CABG. the patient was admitted to the hospital for anemia.  She was having increasing fatigue and shortness of breath and had routine lab work drawn which showed a hemoglobin of 6.6.  Due to her abnormal lab work, she was advised to come to the emergency room for admission.  She was also admitted in February 2020 for symptomatic anemia.  She reports she has been anemic for at least a year and has been taking oral iron.  She underwent a colonoscopy and EGD on 01/22/2018.  The colonoscopy showed diverticulosis in the sigmoid colon and descending colon, internal hemorrhoids.  The upper endoscopy was normal except for erythematous mucosa in the antrum.  She had a repeat EGD on 11/21/2018 that showed a few minimally oozing( with scope trauma) superficial gastric ulcers with pigmented material were found in the gastric body. The largest lesion was 4 mm in largest dimension.  She underwent another upper endoscopy on 02/27/2019 which showed patchy mildly friable mucosa with contact bleeding was found in the gastric fundus, in the gastric body and in the gastric antrum.  The patient is maintained on warfarin due to A. fib and a mechanical aortic valve.  Review of the patient's chart shows that she has been anemic dating back to at least December thousand 12 (these are the earliest labs available to me).  Her highest hemoglobin was 11.6 in December 2012.  I also note that the patient has had intermittent mild leukopenia adding back to at least April 2019.  Her lowest white blood cell count was 2.6 in  April 2019.  Since admission, the patient has received 1 unit of packed red blood cells.  She is currently on heparin for her procedure with plan to bridge back to Coumadin.  Stool for occult blood was negative x1 on 02/24/2019.  Hematology was asked see the patient to make recommendations regarding her anemia.  REVIEW OF SYSTEMS: Today, the patient reports fatigue.  Porting a sore throat from her upper endoscopy performed this morning.  She has no fevers or chills.  She reports some shortness of breath with exertion but no chest discomfort.  Denies epistaxis, hemoptysis, hematemesis.  Reports her stools are always dark due to iron.  She has no abdominal pain reports mild intermittent nausea but no vomiting.  No constipation or diarrhea.  Otherwise, a comprehensive 14 point review of systems is negative.  PAST MEDICAL HISTORY: Past Medical History:  Diagnosis Date  . Aortic atherosclerosis ([facility]) 01/23/2018  . Atrial fibrillation ([facility])   . Carotid artery disease ([facility])   . Cholelithiasis 01/23/2018  . Chronic anticoagulation   . Coronary artery disease    status post coronary artery bypass grafting times 07/10/2004  . Diabetes mellitus   . Hypercholesteremia   . Hypertension   . Mechanical heart valve present    H. aortic valve replacement at the time of bypass surgery October 2005  . Moderate to severe pulmonary hypertension ([facility])   . Peripheral arterial disease ([facility])    history of left common iliac artery PTA and stenting for a chronic total occlusion 08/26/01  PAST SURGICAL HISTORY: Past Surgical History:  Procedure Laterality Date  . AORTIC VALVE REPLACEMENT    . CARDIAC CATHETERIZATION  11/10/2004   40% right common illiac, 70% in stent restenosis of distal left common illiac,   . CARDIAC CATHETERIZATION  05/18/2004   LAD 50-70% midstenosis, RCA dominant w/50% stenosis, 50% Right common Illiac artery ostial stenosis, 90% in stent restenosis within midportion of left common illiac  stent  . Carotid Duplex  03/12/2012   RSA-elev. velocities suggestive of a 50-69% diameter reduction, Right&Left Bulb/Prox ICA-mild-mod.fibrous plaqueelevating Velocities abnormal study.  . CHOLECYSTECTOMY N/A 03/01/2018   Procedure: LAPAROSCOPIC CHOLECYSTECTOMY;  Surgeon: Judeth Horn, MD;  Location: [facility];  Service: General;  Laterality: N/A;  . COLONOSCOPY WITH PROPOFOL N/A 01/22/2018   Procedure: COLONOSCOPY WITH PROPOFOL;  Surgeon: Wilford Corner, MD;  Location: [facility];  Service: Endoscopy;  Laterality: N/A;  . ESOPHAGOGASTRODUODENOSCOPY (EGD) WITH PROPOFOL N/A 01/22/2018   Procedure: ESOPHAGOGASTRODUODENOSCOPY (EGD) WITH PROPOFOL;  Surgeon: Wilford Corner, MD;  Location: [facility];  Service: Endoscopy;  Laterality: N/A;  . ESOPHAGOGASTRODUODENOSCOPY (EGD) WITH PROPOFOL N/A 11/21/2018   Procedure: ESOPHAGOGASTRODUODENOSCOPY (EGD) WITH PROPOFOL;  Surgeon: [name], MD;  Location: [facility];  Service: Gastroenterology;  Laterality: N/A;  . GIVENS CAPSULE STUDY N/A 11/21/2018   Procedure: GIVENS CAPSULE STUDY;  Surgeon: [name], MD;  Location: [facility];  Service: Gastroenterology;  Laterality: N/A;  To be deployed during EGD  . Lower Ext. Duplex  03/12/2012   Right Proximal CIA- vessel narrowing w/elevated velocities 0-49% diameter reduction. Right SFA-mild mixed density plaque throughout vessel.  [name] NM MYOCAR PERF WALL MOTION  05/19/2010   protocol: Persantine, post stress EF 65%, negative for ischemia, low risk scan  . RIGHT HEART CATH N/A 06/27/2018   Procedure: RIGHT HEART CATH;  Surgeon: [name], MD;  Location: [facility];  Service: Cardiovascular;  Laterality: N/A;  . TRANSTHORACIC ECHOCARDIOGRAM  08/29/2012   Moderately calcified annulus of mitral valve, moderate regurg. of both mitral valve and tricuspid valve.    FAMILY HISTORY Family History  Problem Relation Age of Onset  . Breast cancer Neg Hx    GYNECOLOGIC HISTORY: The patient has 3  children.  She is status post hysterectomy. SOCIAL HISTORY: The patient is widowed.  She lives alone here in [place], [place].  She has 3 children that live locally.  She smoked three quarters of a pack of cigarettes daily for 50 years.  She quit in 2005.  Denies alcohol use. ADVANCED DIRECTIVES: She has the paperwork, but has not yet filled this out.  HEALTH MAINTENANCE: Social History   Tobacco Use  . Smoking status: Former Smoker    Packs/day: 1.00    Years: 30.00    Pack years: 30.00    Types: Cigarettes  . Smokeless tobacco: Never Used  Substance Use Topics  . Alcohol use: No    Alcohol/week: 0.0 standard drinks  . Drug use: No   Colonoscopy: April 2019 PAP: No longer complete sees as she is status post hysterectomy Bone density: May 2017 Lipid panel: April 2020 Allergies  Allergen Reactions  . Flagyl [Metronidazole] Rash    ALL-OVER BODY RASH  . Coreg [Carvedilol] Other (See Comments)    Terrible cramping in the feet and had a lot of bowel movements, but not diarrhea  . Losartan Swelling    Patient doesn't recall site of swelling  . Verapamil Hives  . Zetia [Ezetimibe] Other (See Comments)    Reaction not recalled  .  Zocor [Simvastatin - High Dose] Other (See Comments)    Reaction not recalled   Current Facility-Administered Medications  Medication Dose Route Frequency Provider Last Rate Last Dose  . acetaminophen (TYLENOL) tablet 650 mg  650 mg Oral Q6H PRN Arta Silence, MD   650 mg at 02/25/19 2103   Or  . acetaminophen (TYLENOL) suppository 650 mg  650 mg Rectal Q6H PRN Arta Silence, MD      . ALPRAZolam Duanne Moron) tablet 0.25 mg  0.25 mg Oral Michell Heinrich, MD   0.25 mg at 02/26/19 2142  . amLODipine (NORVASC) tablet 5 mg  5 mg Oral Daily Rai, Ripudeep K, MD      . atorvastatin (LIPITOR) tablet 80 mg  80 mg Oral q1800 Arta Silence, MD   80 mg at 02/26/19 1744  . Chlorhexidine Gluconate Cloth 2 % PADS 6 each  6 each Topical Daily Arta Silence, MD      . docusate sodium (COLACE) capsule 100 mg  100 mg Oral BID Arta Silence, MD   100 mg at 02/27/19 1256  . furosemide (LASIX) tablet 80 mg  80 mg Oral Daily Arta Silence, MD   80 mg at 02/27/19 1257  . heparin ADULT infusion 100 units/mL (25000 units/229mL sodium chloride 0.45%)  1,100 Units/hr Intravenous Continuous Arta Silence, MD 11 mL/hr at 02/27/19 1257 1,100 Units/hr at 02/27/19 1257  . hydrALAZINE (APRESOLINE) tablet 100 mg  100 mg Oral TID Arta Silence, MD   100 mg at 02/27/19 5449  . insulin aspart (novoLOG) injection 0-9 Units  0-9 Units Subcutaneous TID WC Arta Silence, MD   1 Units at 02/26/19 1744  . irbesartan (AVAPRO) tablet 300 mg  300 mg Oral Daily Arta Silence, MD   300 mg at 02/27/19 1256  . magnesium oxide (MAG-OX) tablet 400 mg  400 mg Oral QPM Arta Silence, MD   400 mg at 02/26/19 1744  . mupirocin ointment (BACTROBAN) 2 % 1 application  1 application Nasal BID Arta Silence, MD   1 application at 20/10/07 1256  . ondansetron (ZOFRAN) tablet 4 mg  4 mg Oral Q6H PRN Arta Silence, MD       Or  . ondansetron (ZOFRAN) injection 4 mg  4 mg Intravenous Q6H PRN Arta Silence, MD      . pantoprazole (PROTONIX) EC tablet 40 mg  40 mg Oral BID Arta Silence, MD   40 mg at 02/27/19 1257  . sodium chloride flush (NS) 0.9 % injection 3 mL  3 mL Intravenous Q12H Arta Silence, MD   3 mL at 02/25/19 0952  . tadalafil (CIALIS) tablet 20 mg  20 mg Oral BID Arta Silence, MD   20 mg at 02/27/19 1256  . Warfarin - Pharmacist Dosing Inpatient   Does not apply H2197 Arta Silence, MD   Stopped at 02/25/19 1800   OBJECTIVE: Vitals:   02/27/19 1155 02/27/19 1300  BP: (!) 189/37 (!) 160/34  Pulse: 65 71  Resp: 15 20  Temp:  98.6 F (37 C)  SpO2: 97% 97%   Body mass index is 24.37 kg/m. ECOG FS:1 - Symptomatic but completely ambulatory Ocular: Sclerae unicteric, conjunctivae pale, pupils equal, round and reactive to light Ear-nose-throat:  Oropharynx clear, dentition fair Lymphatic: No cervical or supraclavicular adenopathy Lungs no rales or rhonchi, good excursion bilaterally Heart regular rate and rhythm, audible click Abd soft, nontender, positive bowel sounds MSK no focal spinal tenderness, no joint edema Neuro: non-focal, well-oriented, appropriate affect  LAB RESULTS: CMP  Component Value Date/Time   NA 132 (L) 02/27/2019 0038   NA 137 12/06/2017 1436   K 3.9 02/27/2019 0038   CL 104 02/27/2019 0038   CO2 19 (L) 02/27/2019 0038   GLUCOSE 249 (H) 02/27/2019 0038   BUN 40 (H) 02/27/2019 0038   BUN 20 12/06/2017 1436   CREATININE 1.34 (H) 02/27/2019 0038   CALCIUM 8.4 (L) 02/27/2019 0038   PROT 6.2 (L) 02/25/2019 0312   ALBUMIN 3.0 (L) 02/25/2019 0312   AST 19 02/25/2019 0312   ALT 11 02/25/2019 0312   ALKPHOS 69 02/25/2019 0312   BILITOT 1.2 02/25/2019 0312   GFRNONAA 37 (L) 02/27/2019 0038   GFRAA 43 (L) 02/27/2019 0038   INo results found for: SPEP, UPEP Lab Results  Component Value Date   WBC 3.1 (L) 02/27/2019   NEUTROABS 2.9 02/24/2019   HGB 7.0 (L) 02/27/2019   HCT 21.2 (L) 02/27/2019   MCV 90.2 02/27/2019   PLT 146 (L) 02/27/2019   @LASTCHEMISTRY @ No results found for: LABCA2 No components found for: LABCA125 Recent Labs  Lab 02/27/19 0038  INR 1.7*   Urinalysis    Component Value Date/Time   COLORURINE YELLOW 02/24/2018 [place] 02/24/2018 1621   LABSPEC <1.005 (L) 02/24/2018 1621   PHURINE 5.5 02/24/2018 1621   GLUCOSEU NEGATIVE 02/24/2018 1621   HGBUR SMALL (A) 02/24/2018 1621   BILIRUBINUR NEGATIVE 02/24/2018 1621   KETONESUR NEGATIVE 02/24/2018 1621   PROTEINUR NEGATIVE 02/24/2018 1621   UROBILINOGEN 1.0 10/06/2011 1709   NITRITE NEGATIVE 02/24/2018 1621   LEUKOCYTESUR NEGATIVE 02/24/2018 1621   STUDIES: Endoscopic results as noted.  ASSESSMENT: 78 y.o. female from [place], [place] with normocytic anemia thought to be due to a slow GI  blood loss.  The patient is maintained on warfarin due to A. fib and a mechanical aortic valve.  She also has mild intermittent leukopenia.  PLAN: We will add on additional lab work including an LDH and Coombs test.  Recommend packed red blood cell transfusion for hemoglobin less than 7.0 or active bleeding.  We will plan to see Ms. Bourgoin for outpatient follow-up March 20, 2019.  She will be seen by Dr. Jana Hakim at the cancer center on that date.  Further recommendations will be made at that appointment.  Mikey Bussing, NP 02/27/2019 3:22 PM    ADDENDUM: 78 year old [place] woman with multifactorial anemia, as follows:  (a) anemia of chronic inflammation: she has a SED rate of 95, CRP 1.1, positive ANA and RF; she has been referred to rheumatology and tells me she has an appointment pending next week; inflammation makes iron stores relatively inaccessible for hematopoiesis and blunts the effect of native EPO  (b) hemolysis (mild): she has a mildly elevated LDH and a DAT positive for complement, not IgG; this is c/w (a) above  (c) anemia of renal insufficiency: inadequate EPO resonse to anemia, seen in inappropriately normal reticulocyte count; in setting of inflammation ferritin should be >100 to ensure adequate iron stores  (d) occult GIB/ iron deficiency in patient on lifelong anticoagulaton  I discussed this with the patient.  It will be helpful to have her inflammation controlled and hopefully she will be able to see her rheumatologist for an initial appointment next week as scheduled.  We will plan to start erythropoietin and Feraheme when she sees [place] on June 11.  From that point we will follow her on a regular basis to make sure her iron stores are adequate.  The goal will be to keep the hemoglobin between 10 and 11.  Appreciate consulting on this patient!

## 2019-02-28 ENCOUNTER — Inpatient Hospital Stay (HOSPITAL_COMMUNITY): Payer: Medicare HMO

## 2019-02-28 ENCOUNTER — Encounter (HOSPITAL_COMMUNITY): Payer: Self-pay | Admitting: Gastroenterology

## 2019-02-28 ENCOUNTER — Telehealth: Payer: Self-pay | Admitting: Oncology

## 2019-02-28 ENCOUNTER — Other Ambulatory Visit: Payer: Self-pay | Admitting: Oncology

## 2019-02-28 DIAGNOSIS — D509 Iron deficiency anemia, unspecified: Secondary | ICD-10-CM | POA: Insufficient documentation

## 2019-02-28 DIAGNOSIS — I739 Peripheral vascular disease, unspecified: Secondary | ICD-10-CM

## 2019-02-28 DIAGNOSIS — D631 Anemia in chronic kidney disease: Secondary | ICD-10-CM

## 2019-02-28 DIAGNOSIS — N183 Chronic kidney disease, stage 3 unspecified: Secondary | ICD-10-CM

## 2019-02-28 DIAGNOSIS — D594 Other nonautoimmune hemolytic anemias: Secondary | ICD-10-CM | POA: Insufficient documentation

## 2019-02-28 DIAGNOSIS — D696 Thrombocytopenia, unspecified: Secondary | ICD-10-CM

## 2019-02-28 DIAGNOSIS — D508 Other iron deficiency anemias: Secondary | ICD-10-CM

## 2019-02-28 LAB — PROTIME-INR
INR: 1.4 — ABNORMAL HIGH (ref 0.8–1.2)
Prothrombin Time: 16.6 seconds — ABNORMAL HIGH (ref 11.4–15.2)

## 2019-02-28 LAB — DIRECT ANTIGLOBULIN TEST (NOT AT ARMC)
DAT, IgG: NEGATIVE
DAT, complement: POSITIVE

## 2019-02-28 LAB — HEPARIN LEVEL (UNFRACTIONATED): Heparin Unfractionated: 0.46 IU/mL (ref 0.30–0.70)

## 2019-02-28 LAB — BASIC METABOLIC PANEL
Anion gap: 8 (ref 5–15)
BUN: 29 mg/dL — ABNORMAL HIGH (ref 8–23)
CO2: 20 mmol/L — ABNORMAL LOW (ref 22–32)
Calcium: 8.2 mg/dL — ABNORMAL LOW (ref 8.9–10.3)
Chloride: 106 mmol/L (ref 98–111)
Creatinine, Ser: 1.29 mg/dL — ABNORMAL HIGH (ref 0.44–1.00)
GFR calc Af Amer: 45 mL/min — ABNORMAL LOW (ref >60)
GFR calc non Af Amer: 39 mL/min — ABNORMAL LOW (ref >60)
Glucose, Bld: 118 mg/dL — ABNORMAL HIGH (ref 70–99)
Potassium: 4 mmol/L (ref 3.5–5.1)
Sodium: 134 mmol/L — ABNORMAL LOW (ref 135–145)

## 2019-02-28 LAB — URINALYSIS, ROUTINE W REFLEX MICROSCOPIC
Bilirubin Urine: NEGATIVE
Glucose, UA: NEGATIVE mg/dL
Ketones, ur: NEGATIVE mg/dL
Nitrite: NEGATIVE
Protein, ur: 100 mg/dL — AB
Specific Gravity, Urine: 1.011 (ref 1.005–1.030)
pH: 6 (ref 5.0–8.0)

## 2019-02-28 LAB — RETICULOCYTES
Immature Retic Fract: 12.9 % (ref 2.3–15.9)
RBC.: 2.28 MIL/uL — ABNORMAL LOW (ref 3.87–5.11)
Retic Count, Absolute: 82.3 10*3/uL (ref 19.0–186.0)
Retic Ct Pct: 3.6 % — ABNORMAL HIGH (ref 0.4–3.1)

## 2019-02-28 LAB — CBC
HCT: 20.4 % — ABNORMAL LOW (ref 36.0–46.0)
Hemoglobin: 6.8 g/dL — CL (ref 12.0–15.0)
MCH: 29.8 pg (ref 26.0–34.0)
MCHC: 33.3 g/dL (ref 30.0–36.0)
MCV: 89.5 fL (ref 80.0–100.0)
Platelets: 128 10*3/uL — ABNORMAL LOW (ref 150–400)
RBC: 2.28 MIL/uL — ABNORMAL LOW (ref 3.87–5.11)
RDW: 14.6 % (ref 11.5–15.5)
WBC: 3.6 10*3/uL — ABNORMAL LOW (ref 4.0–10.5)
nRBC: 0 % (ref 0.0–0.2)

## 2019-02-28 LAB — GLUCOSE, CAPILLARY
Glucose-Capillary: 114 mg/dL — ABNORMAL HIGH (ref 70–99)
Glucose-Capillary: 215 mg/dL — ABNORMAL HIGH (ref 70–99)
Glucose-Capillary: 162 mg/dL — ABNORMAL HIGH (ref 70–99)
Glucose-Capillary: 158 mg/dL — ABNORMAL HIGH (ref 70–99)

## 2019-02-28 LAB — OCCULT BLOOD X 1 CARD TO LAB, STOOL: Fecal Occult Bld: NEGATIVE

## 2019-02-28 LAB — HEMOGLOBIN AND HEMATOCRIT, BLOOD
HCT: 26.1 % — ABNORMAL LOW (ref 36.0–46.0)
Hemoglobin: 8.9 g/dL — ABNORMAL LOW (ref 12.0–15.0)

## 2019-02-28 LAB — PREPARE RBC (CROSSMATCH)

## 2019-02-28 MED ORDER — SODIUM CHLORIDE 0.9 % IV SOLN
1.0000 g | INTRAVENOUS | Status: DC
  Administered 2019-02-28: 18:00:00 1 g via INTRAVENOUS
  Filled 2019-02-28: qty 10

## 2019-02-28 MED ORDER — SODIUM CHLORIDE 0.9% IV SOLUTION: Freq: Once | INTRAVENOUS | Status: DC

## 2019-02-28 MED ORDER — SODIUM CHLORIDE 0.9 % IV SOLN
500.0000 mg | INTRAVENOUS | Status: DC
  Administered 2019-02-28 – 2019-03-04 (×5): 500 mg via INTRAVENOUS
  Filled 2019-02-28 (×5): qty 500

## 2019-02-28 MED ORDER — FUROSEMIDE 10 MG/ML IJ SOLN
20.0000 mg | Freq: Once | INTRAMUSCULAR | Status: AC
  Administered 2019-02-28: 17:00:00 20 mg via INTRAVENOUS
  Filled 2019-02-28: qty 2

## 2019-02-28 NOTE — Progress Notes (Signed)
Progress Note  Patient Name: [name] Date of Encounter: 02/28/2019  Primary Cardiologist: Quay Burow, MD   Subjective   Had chills and transient fever last night. Feels better now. INR 1.4, No CV complaints.  Inpatient Medications    Scheduled Meds:  sodium chloride   Intravenous Once   ALPRAZolam  0.25 mg Oral QHS   amLODipine  5 mg Oral Daily   atorvastatin  80 mg Oral q1800   Chlorhexidine Gluconate Cloth  6 each Topical Daily   docusate sodium  100 mg Oral BID   furosemide  80 mg Oral Daily   hydrALAZINE  100 mg Oral TID   insulin aspart  0-9 Units Subcutaneous TID WC   irbesartan  300 mg Oral Daily   magnesium oxide  400 mg Oral QPM   mupirocin ointment  1 application Nasal BID   pantoprazole  40 mg Oral BID   sodium chloride flush  3 mL Intravenous Q12H   tadalafil  20 mg Oral BID   Warfarin - Pharmacist Dosing Inpatient   Does not apply q1800   Continuous Infusions:  azithromycin     cefTRIAXone (ROCEPHIN)  IV     heparin 1,200 Units/hr (02/27/19 2237)   PRN Meds: acetaminophen **OR** acetaminophen, ondansetron **OR** ondansetron (ZOFRAN) IV   Vital Signs    Vitals:   02/28/19 1127 02/28/19 1155 02/28/19 1450 02/28/19 1643  BP: (!) 129/57 (!) 145/53 (!) 150/42 (!) 159/45  Pulse: 74 70 70   Resp: 18 18 18 18   Temp: 97.9 F (36.6 C) 97.7 F (36.5 C) 98.4 F (36.9 C) 98.2 F (36.8 C)  TempSrc: Oral Oral Oral Oral  SpO2: 98% 98% 97% 98%  Weight:      Height:        Intake/Output Summary (Last 24 hours) at 02/28/2019 1647 Last data filed at 02/28/2019 1400 Gross per 24 hour  Intake 726.91 ml  Output 1550 ml  Net -823.09 ml   Last 3 Weights 02/26/2019 12/11/2018 11/22/2018  Weight (lbs) 129 lb 131 lb 12.8 oz 128 lb 4.8 oz  Weight (kg) 58.514 kg 59.784 kg 58.196 kg      Telemetry    NSR - Personally Reviewed  ECG    No new tracing - Personally Reviewed  Physical Exam  Appears well, just pale GEN: No acute  distress.   Neck: No JVD Cardiac: RRR, crisp prosthetic valve clicks, 2/6 Ao ejection murmur is early peaking, no diastolic murmurs, rubs, or gallops.  Respiratory: Clear to auscultation bilaterally. GI: Soft, nontender, non-distended  MS: No edema; No deformity. Neuro:  Nonfocal  Psych: Normal affect   Labs    Chemistry Recent Labs  Lab 02/25/19 0312 02/26/19 0309 02/27/19 0038 02/28/19 0709  NA 134* 135 132* 134*  K 3.9 4.0 3.9 4.0  CL 105 104 104 106  CO2 21* 22 19* 20*  GLUCOSE 105* 117* 249* 118*  BUN 45* 42* 40* 29*  CREATININE 1.21* 1.35* 1.34* 1.29*  CALCIUM 8.9 8.6* 8.4* 8.2*  PROT 6.2*  --   --   --   ALBUMIN 3.0*  --   --   --   AST 19  --   --   --   ALT 11  --   --   --   ALKPHOS 69  --   --   --   BILITOT 1.2  --   --   --   GFRNONAA 42* 37* 37* 39*  GFRAA 49*  43* 43* 45*  ANIONGAP 8 9 9 8      Hematology Recent Labs  Lab 02/26/19 0309 02/27/19 0038 02/28/19 0709  WBC 3.1* 3.1* 3.6*  RBC 2.51* 2.35* 2.28*   2.28*  HGB 7.4* 7.0* 6.8*  HCT 22.5* 21.2* 20.4*  MCV 89.6 90.2 89.5  MCH 29.5 29.8 29.8  MCHC 32.9 33.0 33.3  RDW 15.1 14.9 14.6  PLT 167 146* 128*    Cardiac Enzymes Recent Labs  Lab 02/24/19 1427  TROPONINI <0.03   No results for input(s): TROPIPOC in the last 168 hours.   BNPNo results for input(s): BNP, PROBNP in the last 168 hours.   DDimer No results for input(s): DDIMER in the last 168 hours.   Radiology    Dg Chest 2 View  Result Date: 02/28/2019 CLINICAL DATA:  Fever. EXAM: CHEST - 2 VIEW COMPARISON:  Chest radiograph 11/20/2018 and CT 12/12/2018 FINDINGS: Sequelae of CABG and aortic valve replacement are again identified. The cardiac silhouette is mildly enlarged. There is increased ground-glass opacity in the right lung base. Subtly increased hazy opacity is also present in the right greater than left mid lungs. No pleural effusion or pneumothorax is identified. Calcified granulomas are noted in both lungs. No acute  osseous abnormality is seen. IMPRESSION: Hazy lung opacities most notable in the right lung base which are suspicious for pneumonia in this patient with fever (including viral/atypical etiologies). Electronically Signed   By: Logan Bores M.D.   On: 02/28/2019 10:00    Cardiac Studies   Right Heart Catheterization 06/27/2018: 1. Mildly elevated PCWP, normal RA pressure.  2. Preserved cardiac output.  3. Moderate pulmonary arterial hypertension. PVR is only 3.1 WU due to wide PA pulse pressure.   Send V/Q scan to rule out chronic PEs, arrange for PFTs. Will need to consider treatment of PAH given RV dysfunction by echo. _______________  Echocardiogram 02/24/2018: Study Conclusions: - Left ventricle: The cavity size was normal. Wall thickness was normal. Systolic function was normal. The estimated ejection fraction was in the range of 55% to 60%. Wall motion was normal; there were no regional wall motion abnormalities. Doppler parameters are consistent with a reversible restrictive pattern, indicative of decreased left ventricular diastolic compliance and/or increased left atrial pressure (grade 3 diastolic dysfunction). - Ventricular septum: The contour showed diastolic flattening. - Aortic valve: A mechanical prosthesis was present and functioning normally. Peak velocity (S): 238 cm/s. Mean gradient (S): 10 mm Hg. Valve area (VTI): 2.24 cm^2. Valve area (Vmax): 2.26 cm^2. Valve area (Vmean): 1.88 cm^2. - Mitral valve: Calcified annulus. There was mild regurgitation. Valve area by continuity equation (using LVOT flow): 2.53 cm^2. - Left atrium: The atrium was moderately to severely dilated. - Right ventricle: The cavity size was moderately dilated. Wall thickness was normal. Systolic function was moderately to severely reduced. - Right atrium: The atrium was severely dilated. - Tricuspid valve: There was severe regurgitation. - Pulmonary arteries:  Systolic pressure was severely increased. PA peak pressure: 88 mm Hg (S).  Impressions: - When compared to prior, PA pressures have increased  Patient Profile     78 y.o. female with recurrentacute symptomatic anemia, chronic warfarin anticoagulation for mechanical AVR, chronic diastolic heart failure, paroxysmal atrial fibrillation, moderate to severe PAH (possibly due to collagen vascular disease)  Assessment & Plan    1. Mechanical AVR and anticoagulation: INR 1,4. On heparin IV bridge. 2. CAD s/p CABG: no angina despite anemia 3. Anemia: Dr. Jana Hakim pointed out that this is multifactorial,  with elements of chronic inflammatory disease and EPO deficiency in addition to Fe deficiency. 4. Parox AFib: maintaining SR. 5. CHF: clinically euvolemic. 6. PAH: - Right heart cath in 06/2018 showed moderate pulmonary arterial hypertension with PVR of only 3.1 due to wide PA pulse pressure. - PFTs in 07/2018 were unremarkable. - Continue Adcirca 40mg  daily. - Per Dr. Claris Gladden last office visit note, "possible group 1 PH component though this is a borderline call as PVR is not significantly elevated. ANA and RF positive, as is CCP Ab. Possible rheumatoid arthritis. CT chest showed subtle bilateral ground glass opacities in 3/20, this could be due to RA lung effects."         For questions or updates, please contact [facility] Please consult [email or web address] for contact info under        Signed, Sanda Klein, MD  02/28/2019, 4:47 PM

## 2019-02-28 NOTE — Plan of Care (Signed)
No further GI work planned; has had three endoscopies, capsule endoscopy and colonoscopy all within ~ one year to investigate her anemia. Would recommend pantoprazole 40 mg po qd (now and indefinitely upon hospital discharge).  Would defer to cardiology re: anticoagulation management (no obvious GI contraindication based on GI scopes to date) and defer further management of anemia to Hematology consultants.  Will sign-off; please call with questions; thank you for the consultation.

## 2019-02-28 NOTE — Progress Notes (Signed)
CRITICAL VALUE ALERT  Critical Value:  Hgb 6.8  Date & Time Notied:  02/28/19 0835  Provider Notified: Dr. Tana Coast  Orders Received/Actions taken: Awaiting orders

## 2019-02-28 NOTE — Progress Notes (Addendum)
Upon arrival into the patient's room at 1920 to exchange shift to shift report. The patient stated, "I am freezing cold." and was shivering. [name] rectal temp was 101.2. Blount,NP was notified and asked if blood cultures needed to be obtained. Blount,NP returned page stating that blood cultures did not need to be obtained until the patient's temp was greater than 102. NP ordered a 500 mL bolus of Lactated Ringers. PRN tylenol given at [place]. Rectal temp went up to 101.8 at 2200 and then 101.4 at 2237. SBP was running in the 120's/130's with DBP running in the 30's to 40's. Blount,NP notified about the changes in the patient's vital signs. NP returned page and stated "We will have to let her temperature remain where it's at because I cannot give her NSAIDS due to her past medical history." Rectal temperatures continued to be monitored and at 2329 the pt's rectal temp was 100.8 and 99.9 at 0042.   During this time, patient also noticed how her BP was running and she refused her blood pressure medications for tonight.

## 2019-02-28 NOTE — Progress Notes (Signed)
[facility]                                                                              Patient Demographics  [name], is a 78 y.o. female, DOB - [birth year], XIP:[id number]  Admit date - 02/24/2019   Admitting Physician Karmen Bongo, MD  Outpatient Primary MD for the patient is [name], [name], MD  Outpatient specialists:   LOS - 3  days   Medical records reviewed and are as summarized below:    Chief Complaint  Patient presents with  . Abnormal Lab       Brief summary   TASHIANA [name] is an 78 y.o. female with medical history significant ofPAD s/p stenting; moderate to severe pulmonary HTN; afib and AVR on Coumadin; HTN; HLD; DM; and CAD s/p CABG presenting with fatigue.Her GI doctor called and told her her blood was 6.6.  Patient was admitted on 2/20 with symptomatic anemia due to upper GI bleeding.   Assessment & Plan    Principal Problem: Acute on chronic symptomatic anemia -Recent admission in 2/20 with anemia, EGD had shown oozing superficial gastric ulcers. -Continues to have recurrent anemia, on Coumadin for mechanical valve.  Had received 1 unit packed RBCs. -Coumadin has been on hold.  EGD showed patchy mildly friable mucosa with contact bleeding in the gastric fundus otherwise normal duodenum, small hiatal hernia.  GI does not feel acute competent of anemia was from GI source. -Hematology consulted, seen by Dr. Jana Hakim, recommended packed RBC transfusion, Feraheme and monthly iron.  Per Dr. Jana Hakim, multifocal anemia likely due to anemia of chronic inflammation, patient has pending rheumatology appointment next week, mild hemolysis, anemia of CKD, likely occult GIB with iron deficiency -For now continue IV heparin, will await cardiology recommendations regarding anticoagulation. - Hb 6.8 today, ordered 2 units packed RBC transfusion, FOBT negative  Active problems Mechanical aortic valve AVR 2005 Paroxysmal atrial fibrillation  -Currently normal sinus rhythm -Continue heparin drip, Coumadin has been held Awaiting cardiology recommendations regarding anticoagulation given anemia  Chronic diastolic CHF -2D echo in 6/73 showed preserved EF and grade 3 diastolic dysfunction -Continue Lasix  H CAP, UTI -Patient spiked a temp overnight, T-max 101.8 F with tachycardia -UA positive for UTI, positive leukocytes with many bacteria follow urine culture, sent blood cultures -Chest x-ray shows hazy lung opacities in the right lung base suspicious for pneumonia -Placed on IV Zithromax and Rocephin, follow urine culture and sensitivities  Essential hypertension BP somewhat elevated, continue hydralazine, Avapro, Lasix.  On max doses. Allergic to Coreg/beta-blockers.  Continue Norvasc  History of pulmonary hypertension Cardiac cath in 9/19 had shown moderate pulmonary hypertension Patient was started on Adcirca but not clear how much benefit she had, a second medication was not started Repeat echo scheduled for 7/7, has been referred to rheumatology as well   CKD stage III Baseline creatinine 1.2-1.4 Currently at baseline  Diabetes mellitus, type II with complications, CKD -Continue sensitive sliding scale  Code Status: Full code DVT Prophylaxis: SCDs Family Communication: Discussed in detail with the patient, all imaging results, lab results explained to the patient    Disposition Plan:  Time Spent in minutes 35 minutes  Procedures:  EGD  Consultants:   GI, cardiology Hematology  Antimicrobials:   Anti-infectives (From admission, onward)   None         Medications  Scheduled Meds: . sodium chloride   Intravenous Once  . ALPRAZolam  0.25 mg Oral QHS  . amLODipine  5 mg Oral Daily  . atorvastatin  80 mg Oral q1800  . Chlorhexidine Gluconate Cloth  6 each Topical Daily  . docusate sodium  100 mg Oral BID  . furosemide  20 mg Intravenous Once  . furosemide  80 mg Oral Daily  .  hydrALAZINE  100 mg Oral TID  . insulin aspart  0-9 Units Subcutaneous TID WC  . irbesartan  300 mg Oral Daily  . magnesium oxide  400 mg Oral QPM  . mupirocin ointment  1 application Nasal BID  . pantoprazole  40 mg Oral BID  . sodium chloride flush  3 mL Intravenous Q12H  . tadalafil  20 mg Oral BID  . Warfarin - Pharmacist Dosing Inpatient   Does not apply q1800   Continuous Infusions: . heparin 1,200 Units/hr (02/27/19 2237)   PRN Meds:.acetaminophen **OR** acetaminophen, ondansetron **OR** ondansetron (ZOFRAN) IV      Subjective:   [place] Fitting was seen and examined today.  Overnight spiked fever 101.8 F.  Has some coughing and sore throat but no bleeding.  Patient denies dizziness, chest pain, abdominal pain, N/V/D/C, new weakness, numbess, tingling. No acute events overnight.    Objective:   Vitals:   02/28/19 0838 02/28/19 1127 02/28/19 1155 02/28/19 1450  BP: (!) 155/48 (!) 129/57 (!) 145/53 (!) 150/42  Pulse:  74 70 70  Resp:  18 18 18   Temp:  97.9 F (36.6 C) 97.7 F (36.5 C) 98.4 F (36.9 C)  TempSrc:  Oral Oral Oral  SpO2:  98% 98% 97%  Weight:      Height:        Intake/Output Summary (Last 24 hours) at 02/28/2019 1540 Last data filed at 02/28/2019 1400 Gross per 24 hour  Intake 726.91 ml  Output 1550 ml  Net -823.09 ml     Wt Readings from Last 3 Encounters:  02/26/19 58.5 kg  12/11/18 59.8 kg  11/22/18 58.2 kg   Physical Exam  General: Alert and oriented x 3, NAD  Eyes: PERRLA, EOMI, Anicteric Sclera,  HEENT:  Atraumatic, normocephalic  Cardiovascular: S1 S2 clear, no murmurs, No pedal edema b/l  Respiratory: Decreased breath sound at the bases  Gastrointestinal: Soft, nontender, nondistended, NBS  Ext: no pedal edema bilaterally  Neuro: no new deficits  Musculoskeletal: No cyanosis, clubbing  Skin: No rashes  Psych: Normal affect and demeanor, alert and oriented x3     Data Reviewed:  I have personally reviewed following  labs and imaging studies  Micro Results Recent Results (from the past 240 hour(s))  SARS Coronavirus 2 (CEPHEID - Performed in [place] hospital lab), Hosp Order     Status: None   Collection Time: 02/24/19  2:27 PM  Result Value Ref Range Status   SARS Coronavirus 2 NEGATIVE NEGATIVE Final    Comment: (NOTE) If result is NEGATIVE SARS-CoV-2 target nucleic acids are NOT DETECTED. The SARS-CoV-2 RNA is generally detectable in upper and lower  respiratory specimens during the acute phase of infection. The lowest  concentration of SARS-CoV-2 viral copies this assay can detect is 250  copies / mL. A negative result does not preclude SARS-CoV-2 infection  and should not be used as the sole basis for treatment or other  patient management decisions.  A negative result may occur with  improper specimen collection / handling, submission of specimen other  than nasopharyngeal swab, presence of viral mutation(s) within the  areas targeted by this assay, and inadequate number of viral copies  (<250 copies / mL). A negative result must be combined with clinical  observations, patient history, and epidemiological information. If result is POSITIVE SARS-CoV-2 target nucleic acids are DETECTED. The SARS-CoV-2 RNA is generally detectable in upper and lower  respiratory specimens dur ing the acute phase of infection.  Positive  results are indicative of active infection with SARS-CoV-2.  Clinical  correlation with patient history and other diagnostic information is  necessary to determine patient infection status.  Positive results do  not rule out bacterial infection or co-infection with other viruses. If result is PRESUMPTIVE POSTIVE SARS-CoV-2 nucleic acids MAY BE PRESENT.   A presumptive positive result was obtained on the submitted specimen  and confirmed on repeat testing.  While 2019 novel coronavirus  (SARS-CoV-2) nucleic acids may be present in the submitted [name]  additional  confirmatory testing may be necessary for epidemiological  and / or clinical management purposes  to differentiate between  SARS-CoV-2 and other Sarbecovirus currently known to infect humans.  If clinically indicated additional testing with an alternate test  methodology [phone]) is advised. The SARS-CoV-2 RNA is generally  detectable in upper and lower respiratory sp ecimens during the acute  phase of infection. The expected result is Negative. Fact Sheet for Patients:  StrictlyIdeas.no Fact Sheet for Healthcare Providers: BankingDealers.co.za This test is not yet approved or cleared by the [place] FDA and has been authorized for detection and/or diagnosis of SARS-CoV-2 by FDA under an Emergency Use Authorization (EUA).  This EUA will remain in effect (meaning this test can be used) for the duration of the COVID-19 declaration under Section 564(b)(1) of the Act, 21 U.S.C. section 360bbb-3(b)(1), unless the authorization is terminated or revoked sooner. Performed at [facility], [place] [address] [place], [place] [address]   Surgical PCR screen     Status: Abnormal   Collection Time: 02/26/19  9:01 PM  Result Value Ref Range Status   MRSA, PCR NEGATIVE NEGATIVE Final   Staphylococcus aureus POSITIVE (A) NEGATIVE Final    Comment: (NOTE) The Xpert SA Assay (FDA approved for NASAL specimens in patients 36 years of age and older), is one component of a comprehensive surveillance program. It is not intended to diagnose infection nor to guide or monitor treatment. Performed at [facility], [place] [address], [place], [place] [address]     Radiology Reports Dg Chest 2 View  Result Date: 02/28/2019 CLINICAL DATA:  Fever. EXAM: CHEST - 2 VIEW COMPARISON:  Chest radiograph 11/20/2018 and CT 12/12/2018 FINDINGS: Sequelae of CABG and aortic valve replacement are again identified. The cardiac silhouette is mildly enlarged.  There is increased ground-glass opacity in the right lung base. Subtly increased hazy opacity is also present in the right greater than left mid lungs. No pleural effusion or pneumothorax is identified. Calcified granulomas are noted in both lungs. No acute osseous abnormality is seen. IMPRESSION: Hazy lung opacities most notable in the right lung base which are suspicious for pneumonia in this patient with fever (including viral/atypical etiologies). Electronically Signed   By: Logan Bores M.D.   On: 02/28/2019 10:00    Lab Data:  CBC: Recent Labs  Lab 02/24/19 1427 02/25/19 0312 02/25/19 1613 02/26/19 0309 02/27/19 0038 02/28/19 0709  WBC 3.9* 3.3*  --  3.1* 3.1* 3.6*  NEUTROABS 2.9  --   --   --   --   --   HGB 7.0* 7.4* 8.2* 7.4* 7.0* 6.8*  HCT 22.4* 22.5* 24.7* 22.5* 21.2* 20.4*  MCV 94.1 88.6  --  89.6 90.2 89.5  PLT 186 162  --  167 146* 267*   Basic Metabolic Panel: Recent Labs  Lab 02/24/19 1427 02/25/19 0312 02/26/19 0309 02/27/19 0038 02/28/19 0709  NA 134* 134* 135 132* 134*  K 3.9 3.9 4.0 3.9 4.0  CL 102 105 104 104 106  CO2 18* 21* 22 19* 20*  GLUCOSE 117* 105* 117* 249* 118*  BUN 50* 45* 42* 40* 29*  CREATININE 1.40* 1.21* 1.35* 1.34* 1.29*  CALCIUM 9.2 8.9 8.6* 8.4* 8.2*   GFR: Estimated Creatinine Clearance: 28.6 mL/min (A) (by C-G formula based on SCr of 1.29 mg/dL (H)). Liver Function Tests: Recent Labs  Lab 02/25/19 0312  AST 19  ALT 11  ALKPHOS 69  BILITOT 1.2  PROT 6.2*  ALBUMIN 3.0*   No results for input(s): LIPASE, AMYLASE in the last 168 hours. No results for input(s): AMMONIA in the last 168 hours. Coagulation Profile: Recent Labs  Lab 02/25/19 0312 02/26/19 0309 02/26/19 1526 02/27/19 0038 02/28/19 0709  INR 3.1* 2.7* 1.9* 1.7* 1.4*   Cardiac Enzymes: Recent Labs  Lab 02/24/19 1427  TROPONINI <0.03   BNP (last 3 results) No results for input(s): PROBNP in the last 8760 hours. HbA1C: No results for input(s): HGBA1C in  the last 72 hours. CBG: Recent Labs  Lab 02/27/19 1250 02/27/19 1625 02/27/19 2207 02/28/19 0817 02/28/19 1206  GLUCAP 116* 150* 203* 114* 215*   Lipid Profile: No results for input(s): CHOL, HDL, LDLCALC, TRIG, CHOLHDL, LDLDIRECT in the last 72 hours. Thyroid Function Tests: No results for input(s): TSH, T4TOTAL, FREET4, T3FREE, THYROIDAB in the last 72 hours. Anemia Panel: Recent Labs    02/28/19 0709  RETICCTPCT 3.6*   Urine analysis:    Component Value Date/Time   COLORURINE YELLOW 02/28/2019 0855   APPEARANCEUR HAZY (A) 02/28/2019 0855   LABSPEC 1.011 02/28/2019 0855   PHURINE 6.0 02/28/2019 0855   GLUCOSEU NEGATIVE 02/28/2019 0855   HGBUR MODERATE (A) 02/28/2019 0855   BILIRUBINUR NEGATIVE 02/28/2019 0855   KETONESUR NEGATIVE 02/28/2019 0855   PROTEINUR 100 (A) 02/28/2019 0855   UROBILINOGEN 1.0 10/06/2011 1709   NITRITE NEGATIVE 02/28/2019 0855   LEUKOCYTESUR MODERATE (A) 02/28/2019 0855       M.D. [facility] 02/28/2019, 3:40 PM  Pager: [phone] Between 7am to 7pm - call Pager - [phone]  After 7pm go to [email or web address] - password [email or web address]  Call night coverage person covering after 7pm

## 2019-02-28 NOTE — Telephone Encounter (Signed)
Scheduled appt per GM sch message - unable to reach patient . Left message with appt date and time

## 2019-02-28 NOTE — Progress Notes (Signed)
ANTICOAGULATION CONSULT NOTE - Follow-Up Consult  Pharmacy Consults for Warfarin, Heparin Indication: Hx mech AVR + Afib  Vital Signs: Temp: 99.1 F (37.3 C) (05/22 0555) Temp Source: Oral (05/22 0555) BP: 155/48 (05/22 0838) Pulse Rate: 69 (05/22 0555)   Heparin Dosing Wt:  58.5 kg  Labs: Recent Labs    02/26/19 0309 02/26/19 1526  02/27/19 0038 02/27/19 0947 02/27/19 2139 02/28/19 0709  HGB 7.4*  --   --  7.0*  --   --  6.8*  HCT 22.5*  --   --  21.2*  --   --  20.4*  PLT 167  --   --  146*  --   --  128*  LABPROT 28.2* 21.7*  --  19.8*  --   --  16.6*  INR 2.7* 1.9*  --  1.7*  --   --  1.4*  HEPARINUNFRC  --   --    < > 0.10* 0.30 0.11* 0.46  CREATININE 1.35*  --   --  1.34*  --   --  1.29*   < > = values in this interval not displayed.    Estimated Creatinine Clearance: 28.6 mL/min (A) (by C-G formula based on SCr of 1.29 mg/dL (H)).   Medical History: Past Medical History:  Diagnosis Date  . Aortic atherosclerosis ([place]) 01/23/2018  . Atrial fibrillation ([place])   . Carotid artery disease ([place])   . Cholelithiasis 01/23/2018  . Chronic anticoagulation   . Coronary artery disease    status post coronary artery bypass grafting times 07/10/2004  . Diabetes mellitus   . Hypercholesteremia   . Hypertension   . Mechanical heart valve present    H. aortic valve replacement at the time of bypass surgery October 2005  . Moderate to severe pulmonary hypertension ([place])   . Peripheral arterial disease ([facility])    history of left common iliac artery PTA and stenting for a chronic total occlusion 08/26/01    Assessment: 78 yr old female on warfarin PTA for hx mech AVR + Afib. Per chart review - noted INR goal reduction from 2.5-3.5 to 2-3 in June 2019. Admit INR 3.2 on PTA dose, noted symptomatic anemia and recent GIB. Pharmacy consulted for warfarin and heparin dosing this admission. EGD done 5/21 showed friable gastric mucosa, small bowel oozing. May continue heparin per GI  (warfarin on hold)  PTA warfarin dose: 7.5 mg daily except 5 mg M/F.  Heparin level this morning is therapeutic after a rate increase yesterday evening (HL 0.46 << 0.11, goal of 0.3-0.5). Hgb 6.8 - noted plans to infuse 2 units PRBC. Will aim for lower end of heparin goal range and follow-up with further anemia work-up and evaluation. MD to advise when warfarin should be resumed.   Goals of Therapy: INR goal: 2-3 (per [facility]), 2.5-3.5 (per MD notes this admission) Heparin level:  0.3-0.5 (lower d/t anemia and w/u) Monitor platelets by anticoagulation protocol: Yes   Plan:  - Continue Heparin at 1200 units/hr (12 ml/hr) - Will continue to monitor for any signs/symptoms of bleeding and will follow up with heparin level in 8 hours to confirm  Thank you for allowing pharmacy to be a part of this patient's care.  Alycia Rossetti, PharmD, BCPS Clinical Pharmacist Clinical phone for 02/28/2019: [phone] 02/28/2019 9:00 AM   **Pharmacist phone directory can now be found on [email or web address] ([email or web address]).  Listed under [place].

## 2019-02-28 NOTE — Progress Notes (Signed)
I have written for the patient to receive Feraheme and start monthly iron asp on 03/20/2019

## 2019-03-01 LAB — CBC
HCT: 26 % — ABNORMAL LOW (ref 36.0–46.0)
Hemoglobin: 8.7 g/dL — ABNORMAL LOW (ref 12.0–15.0)
MCH: 30 pg (ref 26.0–34.0)
MCHC: 33.5 g/dL (ref 30.0–36.0)
MCV: 89.7 fL (ref 80.0–100.0)
Platelets: 141 10*3/uL — ABNORMAL LOW (ref 150–400)
RBC: 2.9 MIL/uL — ABNORMAL LOW (ref 3.87–5.11)
RDW: 14.3 % (ref 11.5–15.5)
WBC: 4.4 10*3/uL (ref 4.0–10.5)
nRBC: 0 % (ref 0.0–0.2)

## 2019-03-01 LAB — BASIC METABOLIC PANEL
Anion gap: 8 (ref 5–15)
BUN: 31 mg/dL — ABNORMAL HIGH (ref 8–23)
CO2: 21 mmol/L — ABNORMAL LOW (ref 22–32)
Calcium: 8.3 mg/dL — ABNORMAL LOW (ref 8.9–10.3)
Chloride: 106 mmol/L (ref 98–111)
Creatinine, Ser: 1.39 mg/dL — ABNORMAL HIGH (ref 0.44–1.00)
GFR calc Af Amer: 41 mL/min — ABNORMAL LOW (ref >60)
GFR calc non Af Amer: 36 mL/min — ABNORMAL LOW (ref >60)
Glucose, Bld: 109 mg/dL — ABNORMAL HIGH (ref 70–99)
Potassium: 3.8 mmol/L (ref 3.5–5.1)
Sodium: 135 mmol/L (ref 135–145)

## 2019-03-01 LAB — BPAM RBC
Blood Product Expiration Date: 202005302359
Blood Product Expiration Date: 202006082359
ISSUE DATE / TIME: 202005220104
ISSUE DATE / TIME: 202005221651
Unit Type and Rh: 6200
Unit Type and Rh: 9500

## 2019-03-01 LAB — TYPE AND SCREEN
ABO/RH(D): A POS
Antibody Screen: NEGATIVE
Unit division: 0
Unit division: 0

## 2019-03-01 LAB — GLUCOSE, CAPILLARY
Glucose-Capillary: 161 mg/dL — ABNORMAL HIGH (ref 70–99)
Glucose-Capillary: 148 mg/dL — ABNORMAL HIGH (ref 70–99)
Glucose-Capillary: 178 mg/dL — ABNORMAL HIGH (ref 70–99)

## 2019-03-01 LAB — HEPARIN LEVEL (UNFRACTIONATED): Heparin Unfractionated: 0.37 IU/mL (ref 0.30–0.70)

## 2019-03-01 LAB — PROTIME-INR
INR: 1.2 (ref 0.8–1.2)
Prothrombin Time: 15.3 seconds — ABNORMAL HIGH (ref 11.4–15.2)

## 2019-03-01 MED ORDER — SODIUM CHLORIDE 0.9 % IV SOLN
510.0000 mg | Freq: Once | INTRAVENOUS | Status: AC
  Administered 2019-03-01: 510 mg via INTRAVENOUS
  Filled 2019-03-01: qty 17

## 2019-03-01 MED ORDER — SODIUM CHLORIDE 0.9 % IV SOLN
1.0000 g | INTRAVENOUS | Status: DC
  Administered 2019-03-01 – 2019-03-03 (×3): 1 g via INTRAVENOUS
  Filled 2019-03-01 (×3): qty 10

## 2019-03-01 MED ORDER — WARFARIN SODIUM 5 MG PO TABS
10.0000 mg | ORAL_TABLET | Freq: Once | ORAL | Status: AC
  Administered 2019-03-01: 10 mg via ORAL
  Filled 2019-03-01: qty 2

## 2019-03-01 NOTE — Progress Notes (Signed)
ANTICOAGULATION CONSULT NOTE - Follow-Up Consult  Pharmacy Consults for Warfarin, Heparin Indication: Hx mech AVR + Afib  Vital Signs: Temp: 98.9 F (37.2 C) (05/23 0541) Temp Source: Oral (05/23 0541) BP: 155/45 (05/23 0541) Pulse Rate: 76 (05/23 0541)   Heparin Dosing Wt:  58.5 kg  Labs: Recent Labs    02/27/19 0038  02/27/19 2139 02/28/19 0709 02/28/19 2133 03/01/19 0255  HGB 7.0*  --   --  6.8* 8.9* 8.7*  HCT 21.2*  --   --  20.4* 26.1* 26.0*  PLT 146*  --   --  128*  --  141*  LABPROT 19.8*  --   --  16.6*  --  15.3*  INR 1.7*  --   --  1.4*  --  1.2  HEPARINUNFRC 0.10*   < > 0.11* 0.46  --  0.37  CREATININE 1.34*  --   --  1.29*  --  1.39*   < > = values in this interval not displayed.    Estimated Creatinine Clearance: 26.5 mL/min (A) (by C-G formula based on SCr of 1.39 mg/dL (H)).   Medical History: Past Medical History:  Diagnosis Date  . Aortic atherosclerosis ([place]) 01/23/2018  . Atrial fibrillation ([place])   . Carotid artery disease ([place])   . Cholelithiasis 01/23/2018  . Chronic anticoagulation   . Coronary artery disease    status post coronary artery bypass grafting times 07/10/2004  . Diabetes mellitus   . Hypercholesteremia   . Hypertension   . Mechanical heart valve present    H. aortic valve replacement at the time of bypass surgery October 2005  . Moderate to severe pulmonary hypertension ([place])   . Peripheral arterial disease ([facility])    history of left common iliac artery PTA and stenting for a chronic total occlusion 08/26/01    Assessment: 78 yr old female on warfarin PTA for hx mech AVR + Afib. Per chart review - noted INR goal reduction from 2.5-3.5 to 2-3 in June 2019. Admit INR 3.2 on PTA dose, noted symptomatic anemia and recent GIB. Pharmacy consulted for warfarin and heparin dosing this admission. EGD done 5/21 showed friable gastric mucosa, small bowel oozing. May continue heparin per GI (warfarin on hold)  PTA warfarin dose: 7.5 mg  daily except 5 mg M/F.  Heparin level therapeutic with AM labs, MD notified to start warfarin today, INR 1.2  Goals of Therapy: INR goal: 2-3 (per [facility] clinic), 2.5-3.5 (per MD notes this admission) Heparin level:  0.3-0.5 (lower d/t anemia and w/u) Monitor platelets by anticoagulation protocol: Yes   Plan:  Continue heparin gtt at 1200 units/hr Give warfarin 10mg  PO x1 tonight Daily INR, heparin level, CBC, s/s bleeding  Bertis Ruddy, PharmD Clinical Pharmacist Please check AMION for all [place] numbers 03/01/2019 7:04 AM

## 2019-03-01 NOTE — Progress Notes (Signed)
[facility]                                                                              Patient Demographics  [name], is a 78 y.o. female, DOB - [birth year], NTZ:[id number]  Admit date - 02/24/2019   Admitting Physician Karmen Bongo, MD  Outpatient Primary MD for the patient is [name], [name], MD  Outpatient specialists:   LOS - 4  days   Medical records reviewed and are as summarized below:    Chief Complaint  Patient presents with  . Abnormal Lab       Brief summary   [name] is an 79 y.o. female with medical history significant ofPAD s/p stenting; moderate to severe pulmonary HTN; afib and AVR on Coumadin; HTN; HLD; DM; and CAD s/p CABG presenting with fatigue.Her GI doctor called and told her her blood was 6.6.  Patient was admitted on 2/20 with symptomatic anemia due to upper GI bleeding.   Assessment & Plan    Principal Problem: Acute on chronic symptomatic anemia -Recent admission in 2/20 with anemia, EGD had shown oozing superficial gastric ulcers. -Continues to have recurrent anemia, on Coumadin for mechanical valve.  Had received 1 unit packed RBCs. -Coumadin has been on hold.  EGD showed patchy mildly friable mucosa with contact bleeding in the gastric fundus otherwise normal duodenum, small hiatal hernia.  GI does not feel acute competent of anemia was from GI source. -Hematology consulted, seen by Dr. Jana Hakim, recommended packed RBC transfusion, Feraheme and monthly iron.  Per Dr. Jana Hakim, multifocal anemia likely due to anemia of chronic inflammation, patient has pending rheumatology appointment next week, mild hemolysis, anemia of CKD, likely occult GIB with iron deficiency.  Will give 1 dose of IV Feraheme -H&H stable 8.7, start warfarin today, continue IV heparin until INR above 2.5   Active problems Mechanical aortic valve AVR 2005 Paroxysmal atrial fibrillation -Currently normal sinus rhythm -Continue heparin  drip, Coumadin per pharmacy has been restarted. -Await cardiology recommendations if any changes with anticoagulation given anemia   Chronic diastolic CHF -2D echo in 6/75 showed preserved EF and grade 3 diastolic dysfunction -Continue Lasix  H CAP right lung base -No fevers today patient spiked fevers on 5/22 overnight -Chest x-ray showed hazy lung opacity in the right lung base suspicious for pneumonia, could have aspirated -Continue IV Zithromax and Rocephin.   Gram-negative rods UTI Follow urine culture and sensitivities, continue IV Rocephin  Essential hypertension Continue hydralazine, Avapro, Lasix, Norvasc  Allergic to Coreg/beta-blockers.    History of pulmonary hypertension Cardiac cath in 9/19 had shown moderate pulmonary hypertension Patient was started on Adcirca but not clear how much benefit she had, a second medication was not started Repeat echo scheduled for 7/7, has been referred to rheumatology as well   CKD stage III Baseline creatinine 1.2-1.4 Currently at baseline  Diabetes mellitus, type II with complications, CKD -Continue sensitive sliding scale  Code Status: Full code DVT Prophylaxis: SCDs Family Communication: Discussed in detail with the patient, all imaging results, lab results explained to the patient    Disposition Plan: DC when INR above 2.5  Time Spent in minutes 35 minutes  Procedures:  EGD  Consultants:   GI, cardiology Hematology  Antimicrobials:   Anti-infectives (From admission, onward)   Start     Dose/Rate Route Frequency Ordered Stop   02/28/19 1600  cefTRIAXone (ROCEPHIN) 1 g in sodium chloride 0.9 % 100 mL IVPB     1 g 200 mL/hr over 30 Minutes Intravenous Every 24 hours 02/28/19 1545     02/28/19 1600  azithromycin (ZITHROMAX) 500 mg in sodium chloride 0.9 % 250 mL IVPB     500 mg 250 mL/hr over 60 Minutes Intravenous Every 24 hours 02/28/19 1545           Medications  Scheduled Meds: . sodium chloride    Intravenous Once  . ALPRAZolam  0.25 mg Oral QHS  . amLODipine  5 mg Oral Daily  . atorvastatin  80 mg Oral q1800  . Chlorhexidine Gluconate Cloth  6 each Topical Daily  . docusate sodium  100 mg Oral BID  . furosemide  80 mg Oral Daily  . hydrALAZINE  100 mg Oral TID  . insulin aspart  0-9 Units Subcutaneous TID WC  . irbesartan  300 mg Oral Daily  . magnesium oxide  400 mg Oral QPM  . mupirocin ointment  1 application Nasal BID  . pantoprazole  40 mg Oral BID  . sodium chloride flush  3 mL Intravenous Q12H  . tadalafil  20 mg Oral BID  . warfarin  10 mg Oral ONCE-1800  . Warfarin - Pharmacist Dosing Inpatient   Does not apply q1800   Continuous Infusions: . azithromycin 500 mg (02/28/19 1911)  . cefTRIAXone (ROCEPHIN)  IV 1 g (02/28/19 1826)  . heparin 1,200 Units/hr (02/28/19 1706)   PRN Meds:.acetaminophen **OR** acetaminophen, ondansetron **OR** ondansetron (ZOFRAN) IV      Subjective:   [name] was seen and examined today.  No fevers last night or this morning.  Still has mild coughing otherwise no acute complaints.  No significant shortness of breath.    Patient denies dizziness, chest pain, abdominal pain, N/V/D/C, new weakness, numbess, tingling.  No bleeding issues overnight. Objective:   Vitals:   02/28/19 1713 02/28/19 1909 02/28/19 2102 03/01/19 0541  BP: (!) 126/40 (!) 175/42 (!) 134/34 (!) 155/45  Pulse:   63 76  Resp: 18 18 16 14   Temp: 98.1 F (36.7 C) 98.1 F (36.7 C) 98.1 F (36.7 C) 98.9 F (37.2 C)  TempSrc: Oral Oral Oral Oral  SpO2: 98% 98% 95% 95%  Weight:      Height:        Intake/Output Summary (Last 24 hours) at 03/01/2019 1420 Last data filed at 03/01/2019 1306 Gross per 24 hour  Intake 1479 ml  Output 600 ml  Net 879 ml     Wt Readings from Last 3 Encounters:  02/26/19 58.5 kg  12/11/18 59.8 kg  11/22/18 58.2 kg   Physical Exam  General: Alert and oriented x 3, NAD  Eyes:   HEENT:  Atraumatic, normocephalic   Cardiovascular: S1 S2 clear, no murmurs, RRR.   Respiratory: CTAB, no wheezing, rales or rhonchi  Gastrointestinal: Soft, nontender, nondistended, NBS  Ext: Ankle edema bilaterally (chronic)  Neuro: no new deficits  Musculoskeletal: No cyanosis, clubbing  Skin: No rashes  Psych: Normal affect and demeanor, alert and oriented x3     Data Reviewed:  I have personally reviewed following labs and imaging studies  Micro Results Recent Results (from the past 240 hour(s))  SARS Coronavirus 2 (CEPHEID - Performed in [place] hospital lab), Hosp Order     Status: None   Collection Time: 02/24/19  2:27 PM  Result Value Ref Range Status   SARS Coronavirus 2 NEGATIVE NEGATIVE Final    Comment: (NOTE) If result is NEGATIVE SARS-CoV-2 target nucleic acids are NOT DETECTED. The SARS-CoV-2 RNA is generally detectable in upper and lower  respiratory specimens during the acute phase of infection. The lowest  concentration of SARS-CoV-2 viral copies this assay can detect is 250  copies / mL. A negative result does not preclude SARS-CoV-2 infection  and should not be used as the sole basis for treatment or other  patient management decisions.  A negative result may occur with  improper specimen collection / handling, submission of specimen other  than nasopharyngeal swab, presence of viral mutation(s) within the  areas targeted by this assay, and inadequate number of viral copies  (<250 copies / mL). A negative result must be combined with clinical  observations, patient history, and epidemiological information. If result is POSITIVE SARS-CoV-2 target nucleic acids are DETECTED. The SARS-CoV-2 RNA is generally detectable in upper and lower  respiratory specimens dur ing the acute phase of infection.  Positive  results are indicative of active infection with SARS-CoV-2.  Clinical  correlation with patient history and other diagnostic information is  necessary to determine patient  infection status.  Positive results do  not rule out bacterial infection or co-infection with other viruses. If result is PRESUMPTIVE POSTIVE SARS-CoV-2 nucleic acids MAY BE PRESENT.   A presumptive positive result was obtained on the submitted specimen  and confirmed on repeat testing.  While 2019 novel coronavirus  (SARS-CoV-2) nucleic acids may be present in the submitted sample  additional confirmatory testing may be necessary for epidemiological  and / or clinical management purposes  to differentiate between  SARS-CoV-2 and other Sarbecovirus currently known to infect humans.  If clinically indicated additional testing with an alternate test  methodology [phone]) is advised. The SARS-CoV-2 RNA is generally  detectable in upper and lower respiratory sp ecimens during the acute  phase of infection. The expected result is Negative. Fact Sheet for Patients:  StrictlyIdeas.no Fact Sheet for Healthcare Providers: BankingDealers.co.za This test is not yet approved or cleared by the [place] FDA and has been authorized for detection and/or diagnosis of SARS-CoV-2 by FDA under an Emergency Use Authorization (EUA).  This EUA will remain in effect (meaning this test can be used) for the duration of the COVID-19 declaration under Section 564(b)(1) of the Act, 21 U.S.C. section 360bbb-3(b)(1), unless the authorization is terminated or revoked sooner. Performed at [facility], [place] [address], [place], [place] [address]   Surgical PCR screen     Status: Abnormal   Collection Time: 02/26/19  9:01 PM  Result Value Ref Range Status   MRSA, PCR NEGATIVE NEGATIVE Final   Staphylococcus aureus POSITIVE (A) NEGATIVE Final    Comment: (NOTE) The Xpert SA Assay (FDA approved for NASAL specimens in patients 36 years of age and older), is one component of a comprehensive surveillance program. It is not intended to diagnose infection nor to  guide or monitor treatment. Performed at [facility], [place] [address] [place], [place] [address]   Culture, blood (routine x 2)     Status: None (Preliminary result)   Collection Time: 02/28/19  9:51 AM  Result Value Ref Range Status   Specimen Description BLOOD LEFT HAND  Final  Special Requests   Final    BOTTLES DRAWN AEROBIC ONLY Blood Culture adequate volume   Culture   Final    NO GROWTH 1 DAY Performed at [facility], [place] [address], [place], [place] [address]    Report Status PENDING  Incomplete  Culture, blood (routine x 2)     Status: None (Preliminary result)   Collection Time: 02/28/19  9:51 AM  Result Value Ref Range Status   Specimen Description BLOOD LEFT HAND  Final   Special Requests   Final    BOTTLES DRAWN AEROBIC ONLY Blood Culture adequate volume   Culture   Final    NO GROWTH 1 DAY Performed at [facility], [place] [address], [place], [place] [address]    Report Status PENDING  Incomplete  Urine Culture     Status: Abnormal (Preliminary result)   Collection Time: 02/28/19  1:50 PM  Result Value Ref Range Status   Specimen Description URINE, RANDOM  Final   Special Requests NONE  Final   Culture (A)  Final    >=100,000 COLONIES/mL GRAM NEGATIVE RODS CULTURE REINCUBATED FOR BETTER GROWTH Performed at [facility], [place] [address] [place], [place] [address]    Report Status PENDING  Incomplete    Radiology Reports Dg Chest 2 View  Result Date: 02/28/2019 CLINICAL DATA:  Fever. EXAM: CHEST - 2 VIEW COMPARISON:  Chest radiograph 11/20/2018 and CT 12/12/2018 FINDINGS: Sequelae of CABG and aortic valve replacement are again identified. The cardiac silhouette is mildly enlarged. There is increased ground-glass opacity in the right lung base. Subtly increased hazy opacity is also present in the right greater than left mid lungs. No pleural effusion or pneumothorax is identified. Calcified granulomas are noted in both lungs. No acute  osseous abnormality is seen. IMPRESSION: Hazy lung opacities most notable in the right lung base which are suspicious for pneumonia in this patient with fever (including viral/atypical etiologies). Electronically Signed   By: Logan Bores M.D.   On: 02/28/2019 10:00    Lab Data:  CBC: Recent Labs  Lab 02/24/19 1427 02/25/19 0312  02/26/19 0309 02/27/19 0038 02/28/19 0709 02/28/19 2133 03/01/19 0255  WBC 3.9* 3.3*  --  3.1* 3.1* 3.6*  --  4.4  NEUTROABS 2.9  --   --   --   --   --   --   --   HGB 7.0* 7.4*   < > 7.4* 7.0* 6.8* 8.9* 8.7*  HCT 22.4* 22.5*   < > 22.5* 21.2* 20.4* 26.1* 26.0*  MCV 94.1 88.6  --  89.6 90.2 89.5  --  89.7  PLT 186 162  --  167 146* 128*  --  141*   < > = values in this interval not displayed.   Basic Metabolic Panel: Recent Labs  Lab 02/25/19 0312 02/26/19 0309 02/27/19 0038 02/28/19 0709 03/01/19 0255  NA 134* 135 132* 134* 135  K 3.9 4.0 3.9 4.0 3.8  CL 105 104 104 106 106  CO2 21* 22 19* 20* 21*  GLUCOSE 105* 117* 249* 118* 109*  BUN 45* 42* 40* 29* 31*  CREATININE 1.21* 1.35* 1.34* 1.29* 1.39*  CALCIUM 8.9 8.6* 8.4* 8.2* 8.3*   GFR: Estimated Creatinine Clearance: 26.5 mL/min (A) (by C-G formula based on SCr of 1.39 mg/dL (H)). Liver Function Tests: Recent Labs  Lab 02/25/19 0312  AST 19  ALT 11  ALKPHOS 69  BILITOT 1.2  PROT 6.2*  ALBUMIN 3.0*  No results for input(s): LIPASE, AMYLASE in the last 168 hours. No results for input(s): AMMONIA in the last 168 hours. Coagulation Profile: Recent Labs  Lab 02/26/19 0309 02/26/19 1526 02/27/19 0038 02/28/19 0709 03/01/19 0255  INR 2.7* 1.9* 1.7* 1.4* 1.2   Cardiac Enzymes: Recent Labs  Lab 02/24/19 1427  TROPONINI <0.03   BNP (last 3 results) No results for input(s): PROBNP in the last 8760 hours. HbA1C: No results for input(s): HGBA1C in the last 72 hours. CBG: Recent Labs  Lab 02/28/19 1206 02/28/19 1650 02/28/19 2104 03/01/19 0817 03/01/19 1222  GLUCAP 215*  162* 158* 161* 148*   Lipid Profile: No results for input(s): CHOL, HDL, LDLCALC, TRIG, CHOLHDL, LDLDIRECT in the last 72 hours. Thyroid Function Tests: No results for input(s): TSH, T4TOTAL, FREET4, T3FREE, THYROIDAB in the last 72 hours. Anemia Panel: Recent Labs    02/28/19 0709  RETICCTPCT 3.6*   Urine analysis:    Component Value Date/Time   COLORURINE YELLOW 02/28/2019 0855   APPEARANCEUR HAZY (A) 02/28/2019 0855   LABSPEC 1.011 02/28/2019 0855   PHURINE 6.0 02/28/2019 0855   GLUCOSEU NEGATIVE 02/28/2019 0855   HGBUR MODERATE (A) 02/28/2019 0855   BILIRUBINUR NEGATIVE 02/28/2019 0855   KETONESUR NEGATIVE 02/28/2019 0855   PROTEINUR 100 (A) 02/28/2019 0855   UROBILINOGEN 1.0 10/06/2011 1709   NITRITE NEGATIVE 02/28/2019 0855   LEUKOCYTESUR MODERATE (A) 02/28/2019 0855     Ripudeep Rai M.D. [facility] 03/01/2019, 2:20 PM  Pager: [phone] Between 7am to 7pm - call Pager - [phone]  After 7pm go to [email or web address] - password [email or web address]  Call night coverage person covering after 7pm

## 2019-03-02 LAB — CBC
HCT: 25.3 % — ABNORMAL LOW (ref 36.0–46.0)
Hemoglobin: 8.5 g/dL — ABNORMAL LOW (ref 12.0–15.0)
MCH: 30.2 pg (ref 26.0–34.0)
MCHC: 33.6 g/dL (ref 30.0–36.0)
MCV: 90 fL (ref 80.0–100.0)
Platelets: 141 10*3/uL — ABNORMAL LOW (ref 150–400)
RBC: 2.81 MIL/uL — ABNORMAL LOW (ref 3.87–5.11)
RDW: 13.8 % (ref 11.5–15.5)
WBC: 3.7 10*3/uL — ABNORMAL LOW (ref 4.0–10.5)
nRBC: 0 % (ref 0.0–0.2)

## 2019-03-02 LAB — BASIC METABOLIC PANEL
Anion gap: 10 (ref 5–15)
BUN: 28 mg/dL — ABNORMAL HIGH (ref 8–23)
CO2: 20 mmol/L — ABNORMAL LOW (ref 22–32)
Calcium: 8.4 mg/dL — ABNORMAL LOW (ref 8.9–10.3)
Chloride: 106 mmol/L (ref 98–111)
Creatinine, Ser: 1.21 mg/dL — ABNORMAL HIGH (ref 0.44–1.00)
GFR calc Af Amer: 49 mL/min — ABNORMAL LOW (ref >60)
GFR calc non Af Amer: 42 mL/min — ABNORMAL LOW (ref >60)
Glucose, Bld: 103 mg/dL — ABNORMAL HIGH (ref 70–99)
Potassium: 3.6 mmol/L (ref 3.5–5.1)
Sodium: 136 mmol/L (ref 135–145)

## 2019-03-02 LAB — GLUCOSE, CAPILLARY
Glucose-Capillary: 90 mg/dL (ref 70–99)
Glucose-Capillary: 132 mg/dL — ABNORMAL HIGH (ref 70–99)
Glucose-Capillary: 185 mg/dL — ABNORMAL HIGH (ref 70–99)
Glucose-Capillary: 119 mg/dL — ABNORMAL HIGH (ref 70–99)

## 2019-03-02 LAB — HEPARIN LEVEL (UNFRACTIONATED): Heparin Unfractionated: 0.33 IU/mL (ref 0.30–0.70)

## 2019-03-02 LAB — PROTIME-INR
INR: 1.2 (ref 0.8–1.2)
Prothrombin Time: 14.9 seconds (ref 11.4–15.2)

## 2019-03-02 MED ORDER — AMLODIPINE BESYLATE 10 MG PO TABS: 10.0000 mg | ORAL_TABLET | Freq: Every day | ORAL | Status: DC

## 2019-03-02 MED ORDER — WARFARIN SODIUM 5 MG PO TABS
10.0000 mg | ORAL_TABLET | Freq: Once | ORAL | Status: AC
  Administered 2019-03-02: 10 mg via ORAL
  Filled 2019-03-02: qty 2

## 2019-03-02 MED ORDER — AMLODIPINE BESYLATE 5 MG PO TABS
5.0000 mg | ORAL_TABLET | Freq: Once | ORAL | Status: AC
  Administered 2019-03-02: 5 mg via ORAL
  Filled 2019-03-02: qty 1

## 2019-03-02 MED ORDER — FUROSEMIDE 10 MG/ML IJ SOLN
40.0000 mg | Freq: Once | INTRAMUSCULAR | Status: AC
  Administered 2019-03-02: 40 mg via INTRAVENOUS
  Filled 2019-03-02: qty 4

## 2019-03-02 NOTE — Progress Notes (Signed)
[facility]                                                                              Patient Demographics  [name], is a 78 y.o. female, DOB - Jun 18, 1941, XNA:[id number]  Admit date - 02/24/2019   Admitting Physician Karmen Bongo, MD  Outpatient Primary MD for the patient is [name], [name], MD  Outpatient specialists:   LOS - 5  days   Medical records reviewed and are as summarized below:    Chief Complaint  Patient presents with  . Abnormal Lab       Brief summary   [name] is an 78 y.o. female with medical history significant ofPAD s/p stenting; moderate to severe pulmonary HTN; afib and AVR on Coumadin; HTN; HLD; DM; and CAD s/p CABG presenting with fatigue.Her GI doctor called and told her her blood was 6.6.  Patient was admitted on 2/20 with symptomatic anemia due to upper GI bleeding.   Assessment & Plan    Principal Problem: Acute on chronic symptomatic anemia -Recent admission in 2/20 with anemia, EGD had shown oozing superficial gastric ulcers. -Continues to have recurrent anemia, on Coumadin for mechanical valve.  Had received 1 unit packed RBCs. -Coumadin has been on hold.  EGD showed patchy mildly friable mucosa with contact bleeding in the gastric fundus otherwise normal duodenum, small hiatal hernia.  GI does not feel acute competent of anemia was from GI source. -Hematology consulted, seen by Dr. Jana Hakim, recommended packed RBC transfusion, Feraheme and monthly iron.  Per Dr. Jana Hakim, multifocal anemia likely due to anemia of chronic inflammation, patient has pending rheumatology appointment next week, mild hemolysis, anemia of CKD, likely occult GIB with iron deficiency.   -Status post IV Feraheme on 5/23, hemoglobin stable 8.5.     Active problems Mechanical aortic valve AVR 2005 Paroxysmal atrial fibrillation -Currently normal sinus rhythm -Continue IV heparin drip bridge -INR still subtherapeutic 1.2, Coumadin  per pharmacy   Chronic diastolic CHF -2D echo in 5/42 showed preserved EF and grade 3 diastolic dysfunction -Continue Lasix  H CAP right lung base -No fevers, coughing is improving  -Chest x-ray showed hazy lung opacity in the right lung base suspicious for pneumonia, could have aspirated -Continue IV Zithromax and Rocephin.   E. coli UTI Urine culture showed more than 100,000 colonies of E. coli and 80,000 colonies of Klebsiella pneumonia -Continue IV Rocephin  Essential hypertension -BP still elevated, increase Norvasc to 10 mg daily, continue hydralazine, Avapro, Lasix Allergic to Coreg/beta-blockers.    History of pulmonary hypertension Cardiac cath in 9/19 had shown moderate pulmonary hypertension Patient was started on Adcirca but not clear how much benefit she had, a second medication was not started Repeat echo scheduled for 7/7, has been referred to rheumatology as well   CKD stage III Baseline creatinine 1.2-1.4 Currently at baseline  Diabetes mellitus, type II with complications, CKD -Continue sensitive sliding scale  Code Status: Full code DVT Prophylaxis: SCDs Family Communication: Discussed in detail with the patient, all imaging results, lab results explained to the patient    Disposition Plan: DC when INR above 2.5, mechanical  valve  Time Spent in minutes 25 minutes  Procedures:  EGD  Consultants:   GI, cardiology Hematology  Antimicrobials:   Anti-infectives (From admission, onward)   Start     Dose/Rate Route Frequency Ordered Stop   03/01/19 1800  cefTRIAXone (ROCEPHIN) 1 g in sodium chloride 0.9 % 100 mL IVPB     1 g 200 mL/hr over 30 Minutes Intravenous Every 24 hours 03/01/19 1646     02/28/19 1600  cefTRIAXone (ROCEPHIN) 1 g in sodium chloride 0.9 % 100 mL IVPB  Status:  Discontinued     1 g 200 mL/hr over 30 Minutes Intravenous Every 24 hours 02/28/19 1545 03/01/19 1646   02/28/19 1600  azithromycin (ZITHROMAX) 500 mg in sodium  chloride 0.9 % 250 mL IVPB     500 mg 250 mL/hr over 60 Minutes Intravenous Every 24 hours 02/28/19 1545           Medications  Scheduled Meds: . sodium chloride   Intravenous Once  . ALPRAZolam  0.25 mg Oral QHS  . amLODipine  5 mg Oral Daily  . atorvastatin  80 mg Oral q1800  . Chlorhexidine Gluconate Cloth  6 each Topical Daily  . docusate sodium  100 mg Oral BID  . furosemide  80 mg Oral Daily  . hydrALAZINE  100 mg Oral TID  . insulin aspart  0-9 Units Subcutaneous TID WC  . irbesartan  300 mg Oral Daily  . magnesium oxide  400 mg Oral QPM  . mupirocin ointment  1 application Nasal BID  . pantoprazole  40 mg Oral BID  . sodium chloride flush  3 mL Intravenous Q12H  . tadalafil  20 mg Oral BID  . warfarin  10 mg Oral ONCE-1800  . Warfarin - Pharmacist Dosing Inpatient   Does not apply q1800   Continuous Infusions: . azithromycin 500 mg (03/01/19 1653)  . cefTRIAXone (ROCEPHIN)  IV 1 g (03/01/19 2115)  . heparin 1,200 Units/hr (03/01/19 1643)   PRN Meds:.acetaminophen **OR** acetaminophen, ondansetron **OR** ondansetron (ZOFRAN) IV      Subjective:   [name] was seen and examined today.  No acute complaints, ambulating in the room.  Coughing is improving.  No fevers. Patient denies dizziness, chest pain, abdominal pain, N/V/D/C, new weakness, numbess, tingling.    Objective:   Vitals:   03/01/19 1626 03/01/19 2133 03/01/19 2226 03/02/19 0546  BP: (!) 168/42 (!) 182/43 (!) 149/39 (!) 171/46  Pulse: 64 69 67 69  Resp: 16 18  18   Temp: 97.8 F (36.6 C) 98.5 F (36.9 C)  98.8 F (37.1 C)  TempSrc: Oral Oral  Oral  SpO2: 99% 97%  95%  Weight:      Height:        Intake/Output Summary (Last 24 hours) at 03/02/2019 1447 Last data filed at 03/02/2019 1237 Gross per 24 hour  Intake 794 ml  Output 400 ml  Net 394 ml     Wt Readings from Last 3 Encounters:  02/26/19 58.5 kg  12/11/18 59.8 kg  11/22/18 58.2 kg    Physical Exam  General: Alert  and oriented x 3, NAD  Eyes:   HEENT:  Atraumatic, normocephalic  Cardiovascular: S1 S2 clear, RRR. No pedal edema b/l  Respiratory: CTAB, no wheezing, rales or rhonchi  Gastrointestinal: Soft, nontender, nondistended, NBS  Ext: no pedal edema bilaterally  Neuro: no new deficits   Musculoskeletal:   Skin: No rashes  Psych: Normal affect and demeanor, alert and oriented  x3     Data Reviewed:  I have personally reviewed following labs and imaging studies  Micro Results Recent Results (from the past 240 hour(s))  SARS Coronavirus 2 (CEPHEID - Performed in [place] hospital lab), Hosp Order     Status: None   Collection Time: 02/24/19  2:27 PM  Result Value Ref Range Status   SARS Coronavirus 2 NEGATIVE NEGATIVE Final    Comment: (NOTE) If result is NEGATIVE SARS-CoV-2 target nucleic acids are NOT DETECTED. The SARS-CoV-2 RNA is generally detectable in upper and lower  respiratory specimens during the acute phase of infection. The lowest  concentration of SARS-CoV-2 viral copies this assay can detect is 250  copies / mL. A negative result does not preclude SARS-CoV-2 infection  and should not be used as the sole basis for treatment or other  patient management decisions.  A negative result may occur with  improper specimen collection / handling, submission of specimen other  than nasopharyngeal swab, presence of viral mutation(s) within the  areas targeted by this assay, and inadequate number of viral copies  (<250 copies / mL). A negative result must be combined with clinical  observations, patient history, and epidemiological information. If result is POSITIVE SARS-CoV-2 target nucleic acids are DETECTED. The SARS-CoV-2 RNA is generally detectable in upper and lower  respiratory specimens dur ing the acute phase of infection.  Positive  results are indicative of active infection with SARS-CoV-2.  Clinical  correlation with patient history and other diagnostic  information is  necessary to determine patient infection status.  Positive results do  not rule out bacterial infection or co-infection with other viruses. If result is PRESUMPTIVE POSTIVE SARS-CoV-2 nucleic acids MAY BE PRESENT.   A presumptive positive result was obtained on the submitted specimen  and confirmed on repeat testing.  While 2019 novel coronavirus  (SARS-CoV-2) nucleic acids may be present in the submitted sample  additional confirmatory testing may be necessary for epidemiological  and / or clinical management purposes  to differentiate between  SARS-CoV-2 and other Sarbecovirus currently known to infect humans.  If clinically indicated additional testing with an alternate test  methodology [id number]) is advised. The SARS-CoV-2 RNA is generally  detectable in upper and lower respiratory sp ecimens during the acute  phase of infection. The expected result is Negative. Fact Sheet for Patients:  StrictlyIdeas.no Fact Sheet for Healthcare Providers: BankingDealers.co.za This test is not yet approved or cleared by the [place] FDA and has been authorized for detection and/or diagnosis of SARS-CoV-2 by FDA under an Emergency Use Authorization (EUA).  This EUA will remain in effect (meaning this test can be used) for the duration of the COVID-19 declaration under Section 564(b)(1) of the Act, 21 U.S.C. section 360bbb-3(b)(1), unless the authorization is terminated or revoked sooner. Performed at [facility], [place] [address] [place], [place] [address]   Surgical PCR screen     Status: Abnormal   Collection Time: 02/26/19  9:01 PM  Result Value Ref Range Status   MRSA, PCR NEGATIVE NEGATIVE Final   Staphylococcus aureus POSITIVE (A) NEGATIVE Final    Comment: (NOTE) The Xpert SA Assay (FDA approved for NASAL specimens in patients 96 years of age and older), is one component of a comprehensive surveillance program.  It is not intended to diagnose infection nor to guide or monitor treatment. Performed at [facility], [place] [address] [place], [place] [address]   Culture, blood (routine x 2)  Status: None (Preliminary result)   Collection Time: 02/28/19  9:51 AM  Result Value Ref Range Status   Specimen Description BLOOD LEFT HAND  Final   Special Requests   Final    BOTTLES DRAWN AEROBIC ONLY Blood Culture adequate volume   Culture   Final    NO GROWTH 2 DAYS Performed at [facility], [address], [place], [place] [address]    Report Status PENDING  Incomplete  Culture, blood (routine x 2)     Status: None (Preliminary result)   Collection Time: 02/28/19  9:51 AM  Result Value Ref Range Status   Specimen Description BLOOD LEFT HAND  Final   Special Requests   Final    BOTTLES DRAWN AEROBIC ONLY Blood Culture adequate volume   Culture   Final    NO GROWTH 2 DAYS Performed at [facility], [place] [address] [place], [place] [address]    Report Status PENDING  Incomplete  Urine Culture     Status: Abnormal (Preliminary result)   Collection Time: 02/28/19  1:50 PM  Result Value Ref Range Status   Specimen Description URINE, RANDOM  Final   Special Requests NONE  Final   Culture (A)  Final    >=100,000 COLONIES/mL ESCHERICHIA COLI 80,000 COLONIES/mL KLEBSIELLA PNEUMONIAE SUSCEPTIBILITIES TO FOLLOW Performed at [facility], [place] [address] [place], [place] [address]    Report Status PENDING  Incomplete    Radiology Reports Dg Chest 2 View  Result Date: 02/28/2019 CLINICAL DATA:  Fever. EXAM: CHEST - 2 VIEW COMPARISON:  Chest radiograph 11/20/2018 and CT 12/12/2018 FINDINGS: Sequelae of CABG and aortic valve replacement are again identified. The cardiac silhouette is mildly enlarged. There is increased ground-glass opacity in the right lung base. Subtly increased hazy opacity is also present in the right greater than left mid lungs. No pleural effusion or  pneumothorax is identified. Calcified granulomas are noted in both lungs. No acute osseous abnormality is seen. IMPRESSION: Hazy lung opacities most notable in the right lung base which are suspicious for pneumonia in this patient with fever (including viral/atypical etiologies). Electronically Signed   By: Logan Bores M.D.   On: 02/28/2019 10:00    Lab Data:  CBC: Recent Labs  Lab 02/24/19 1427  02/26/19 0309 02/27/19 0038 02/28/19 0709 02/28/19 2133 03/01/19 0255 03/02/19 0307  WBC 3.9*   < > 3.1* 3.1* 3.6*  --  4.4 3.7*  NEUTROABS 2.9  --   --   --   --   --   --   --   HGB 7.0*   < > 7.4* 7.0* 6.8* 8.9* 8.7* 8.5*  HCT 22.4*   < > 22.5* 21.2* 20.4* 26.1* 26.0* 25.3*  MCV 94.1   < > 89.6 90.2 89.5  --  89.7 90.0  PLT 186   < > 167 146* 128*  --  141* 141*   < > = values in this interval not displayed.   Basic Metabolic Panel: Recent Labs  Lab 02/26/19 0309 02/27/19 0038 02/28/19 0709 03/01/19 0255 03/02/19 0307  NA 135 132* 134* 135 136  K 4.0 3.9 4.0 3.8 3.6  CL 104 104 106 106 106  CO2 22 19* 20* 21* 20*  GLUCOSE 117* 249* 118* 109* 103*  BUN 42* 40* 29* 31* 28*  CREATININE 1.35* 1.34* 1.29* 1.39* 1.21*  CALCIUM 8.6* 8.4* 8.2* 8.3* 8.4*   GFR: Estimated Creatinine Clearance: 30.5 mL/min (A) (by C-G formula based  on SCr of 1.21 mg/dL (H)). Liver Function Tests: Recent Labs  Lab 02/25/19 0312  AST 19  ALT 11  ALKPHOS 69  BILITOT 1.2  PROT 6.2*  ALBUMIN 3.0*   No results for input(s): LIPASE, AMYLASE in the last 168 hours. No results for input(s): AMMONIA in the last 168 hours. Coagulation Profile: Recent Labs  Lab 02/26/19 1526 02/27/19 0038 02/28/19 0709 03/01/19 0255 03/02/19 0307  INR 1.9* 1.7* 1.4* 1.2 1.2   Cardiac Enzymes: Recent Labs  Lab 02/24/19 1427  TROPONINI <0.03   BNP (last 3 results) No results for input(s): PROBNP in the last 8760 hours. HbA1C: No results for input(s): HGBA1C in the last 72 hours. CBG: Recent Labs  Lab  03/01/19 0817 03/01/19 1222 03/01/19 2129 03/02/19 0741 03/02/19 1238  GLUCAP 161* 148* 178* 90 132*   Lipid Profile: No results for input(s): CHOL, HDL, LDLCALC, TRIG, CHOLHDL, LDLDIRECT in the last 72 hours. Thyroid Function Tests: No results for input(s): TSH, T4TOTAL, FREET4, T3FREE, THYROIDAB in the last 72 hours. Anemia Panel: Recent Labs    02/28/19 0709  RETICCTPCT 3.6*   Urine analysis:    Component Value Date/Time   COLORURINE YELLOW 02/28/2019 0855   APPEARANCEUR HAZY (A) 02/28/2019 0855   LABSPEC 1.011 02/28/2019 0855   PHURINE 6.0 02/28/2019 0855   GLUCOSEU NEGATIVE 02/28/2019 0855   HGBUR MODERATE (A) 02/28/2019 0855   BILIRUBINUR NEGATIVE 02/28/2019 0855   KETONESUR NEGATIVE 02/28/2019 0855   PROTEINUR 100 (A) 02/28/2019 0855   UROBILINOGEN 1.0 10/06/2011 1709   NITRITE NEGATIVE 02/28/2019 0855   LEUKOCYTESUR MODERATE (A) 02/28/2019 0855     Ripudeep Rai M.D. [facility] 03/02/2019, 2:47 PM  Pager: [phone] Between 7am to 7pm - call Pager - [phone]  After 7pm go to [email or web address] - password [email or web address]  Call night coverage person covering after 7pm

## 2019-03-02 NOTE — Progress Notes (Signed)
ANTICOAGULATION CONSULT NOTE - Follow-Up Consult  Pharmacy Consults for Warfarin, Heparin Indication: Hx mech AVR + Afib  Vital Signs: Temp: 98.8 F (37.1 C) (05/24 0546) Temp Source: Oral (05/24 0546) BP: 171/46 (05/24 0546) Pulse Rate: 69 (05/24 0546)   Heparin Dosing Wt:  58.5 kg  Labs: Recent Labs    02/28/19 0709 02/28/19 2133 03/01/19 0255 03/02/19 0307  HGB 6.8* 8.9* 8.7* 8.5*  HCT 20.4* 26.1* 26.0* 25.3*  PLT 128*  --  141* 141*  LABPROT 16.6*  --  15.3* 14.9  INR 1.4*  --  1.2 1.2  HEPARINUNFRC 0.46  --  0.37 0.33  CREATININE 1.29*  --  1.39* 1.21*    Estimated Creatinine Clearance: 30.5 mL/min (A) (by C-G formula based on SCr of 1.21 mg/dL (H)).   Medical History: Past Medical History:  Diagnosis Date  . Aortic atherosclerosis ([place]) 01/23/2018  . Atrial fibrillation ([place])   . Carotid artery disease ([place])   . Cholelithiasis 01/23/2018  . Chronic anticoagulation   . Coronary artery disease    status post coronary artery bypass grafting times 07/10/2004  . Diabetes mellitus   . Hypercholesteremia   . Hypertension   . Mechanical heart valve present    H. aortic valve replacement at the time of bypass surgery October 2005  . Moderate to severe pulmonary hypertension ([place])   . Peripheral arterial disease ([facility])    history of left common iliac artery PTA and stenting for a chronic total occlusion 08/26/01    Assessment: 78 yr old female on warfarin PTA for hx mech AVR + Afib. Per chart review - noted INR goal reduction from 2.5-3.5 to 2-3 in June 2019. Admit INR 3.2 on PTA dose, noted symptomatic anemia and recent GIB. Pharmacy consulted for warfarin and heparin dosing this admission. EGD done 5/21 showed friable gastric mucosa, small bowel oozing. May continue anticoagulation per GI.  PTA warfarin dose: 7.5 mg daily except 5 mg M/F.  Heparin level therapeutic with AM labs  Warfarin resumed 5/23. INR 1.2   CBC stable. No overt bleeding noted  Goals of  Therapy: INR goal: 2-3 (per [facility] clinic), 2.5-3.5 (per MD notes this admission) Heparin level:  0.3-0.5 (lower d/t anemia and w/u) Monitor platelets by anticoagulation protocol: Yes   Plan:  Continue heparin gtt at 1200 units/hr Give warfarin 10mg  PO x1 tonight Daily INR, heparin level, CBC, s/s bleeding  Vertis Kelch, PharmD PGY1 Pharmacy Resident Phone [phone] 03/02/2019       7:42 AM

## 2019-03-03 LAB — URINE CULTURE: Culture: >=100000 — AB

## 2019-03-03 LAB — CBC
HCT: 24.9 % — ABNORMAL LOW (ref 36.0–46.0)
Hemoglobin: 8.2 g/dL — ABNORMAL LOW (ref 12.0–15.0)
MCH: 30.3 pg (ref 26.0–34.0)
MCHC: 32.9 g/dL (ref 30.0–36.0)
MCV: 91.9 fL (ref 80.0–100.0)
Platelets: 163 10*3/uL (ref 150–400)
RBC: 2.71 MIL/uL — ABNORMAL LOW (ref 3.87–5.11)
RDW: 14.1 % (ref 11.5–15.5)
WBC: 3.4 10*3/uL — ABNORMAL LOW (ref 4.0–10.5)
nRBC: 0 % (ref 0.0–0.2)

## 2019-03-03 LAB — PROTIME-INR
INR: 1.8 — ABNORMAL HIGH (ref 0.8–1.2)
Prothrombin Time: 20.2 seconds — ABNORMAL HIGH (ref 11.4–15.2)

## 2019-03-03 LAB — HEPARIN LEVEL (UNFRACTIONATED): Heparin Unfractionated: 0.39 IU/mL (ref 0.30–0.70)

## 2019-03-03 LAB — GLUCOSE, CAPILLARY
Glucose-Capillary: 111 mg/dL — ABNORMAL HIGH (ref 70–99)
Glucose-Capillary: 151 mg/dL — ABNORMAL HIGH (ref 70–99)
Glucose-Capillary: 180 mg/dL — ABNORMAL HIGH (ref 70–99)
Glucose-Capillary: 148 mg/dL — ABNORMAL HIGH (ref 70–99)

## 2019-03-03 MED ORDER — WARFARIN SODIUM 5 MG PO TABS
5.0000 mg | ORAL_TABLET | Freq: Once | ORAL | Status: AC
  Administered 2019-03-03: 17:00:00 5 mg via ORAL
  Filled 2019-03-03: qty 1

## 2019-03-03 MED ORDER — CHLORHEXIDINE GLUCONATE CLOTH 2 % EX PADS
6.0000 | MEDICATED_PAD | Freq: Every day | CUTANEOUS | Status: DC
  Administered 2019-03-03 – 2019-03-05 (×2): 6 via TOPICAL

## 2019-03-03 MED ORDER — ISOSORBIDE MONONITRATE ER 30 MG PO TB24: 30.0000 mg | ORAL_TABLET | Freq: Every day | ORAL | Status: DC

## 2019-03-03 MED ORDER — AMLODIPINE BESYLATE 5 MG PO TABS
5.0000 mg | ORAL_TABLET | Freq: Every day | ORAL | Status: DC
  Administered 2019-03-03 – 2019-03-05 (×3): 5 mg via ORAL
  Filled 2019-03-03 (×3): qty 1

## 2019-03-03 MED ORDER — DIPHENHYDRAMINE HCL 12.5 MG/5ML PO ELIX
12.5000 mg | ORAL_SOLUTION | Freq: Once | ORAL | Status: AC
  Administered 2019-03-04: 12.5 mg via ORAL
  Filled 2019-03-03: qty 10

## 2019-03-03 NOTE — Progress Notes (Signed)
ANTICOAGULATION CONSULT NOTE - Follow-Up Consult  Pharmacy Consults for Warfarin, Heparin Indication: Hx mech AVR + Afib  Vital Signs: Temp: 98.7 F (37.1 C) (05/24 2152) Temp Source: Oral (05/24 2152) BP: 168/39 (05/24 2152) Pulse Rate: 71 (05/24 2152)   Heparin Dosing Wt:  58.5 kg  Labs: Recent Labs    03/01/19 0255 03/02/19 0307 03/03/19 0325  HGB 8.7* 8.5* 8.2*  HCT 26.0* 25.3* 24.9*  PLT 141* 141* 163  LABPROT 15.3* 14.9 20.2*  INR 1.2 1.2 1.8*  HEPARINUNFRC 0.37 0.33 0.39  CREATININE 1.39* 1.21*  --     Estimated Creatinine Clearance: 30.5 mL/min (A) (by C-G formula based on SCr of 1.21 mg/dL (H)).   Medical History: Past Medical History:  Diagnosis Date  . Aortic atherosclerosis ([place]) 01/23/2018  . Atrial fibrillation ([place])   . Carotid artery disease ([place])   . Cholelithiasis 01/23/2018  . Chronic anticoagulation   . Coronary artery disease    status post coronary artery bypass grafting times 07/10/2004  . Diabetes mellitus   . Hypercholesteremia   . Hypertension   . Mechanical heart valve present    H. aortic valve replacement at the time of bypass surgery October 2005  . Moderate to severe pulmonary hypertension ([place])   . Peripheral arterial disease ([facility])    history of left common iliac artery PTA and stenting for a chronic total occlusion 08/26/01    Assessment: 78 yr old female on warfarin PTA for hx mech AVR + Afib. Per chart review - noted INR goal reduction from 2.5-3.5 to 2-3 in June 2019. Admit INR 3.2 on PTA dose, noted symptomatic anemia and recent GIB. Pharmacy consulted for warfarin and heparin dosing this admission. EGD done 5/21 showed friable gastric mucosa, small bowel oozing. May continue anticoagulation per GI.  PTA warfarin dose: 7.5 mg daily except 5 mg M/F.  Heparin level therapeutic with AM labs  Warfarin resumed 5/23. INR 1.8  CBC stable. No overt bleeding noted  Goals of Therapy: INR goal: 2-3 (per [facility]), 2.5-3.5 (per  MD notes this admission) Heparin level:  0.3-0.5 (lower d/t anemia and w/u) Monitor platelets by anticoagulation protocol: Yes   Plan:  Continue heparin gtt at 1200 units/hr Give warfarin 5mg  PO x1 tonight Daily INR, heparin level, CBC, s/s bleeding  Vertis Kelch, PharmD PGY1 Pharmacy Resident Phone [phone] 03/03/2019       7:44 AM

## 2019-03-03 NOTE — Progress Notes (Signed)
[facility]                                                                              Patient Demographics  [name], is a 78 y.o. female, DOB - 11-Aug-1941, IRW:[id number]  Admit date - 02/24/2019   Admitting Physician Karmen Bongo, MD  Outpatient Primary MD for the patient is [name], [name], MD  Outpatient specialists:   LOS - 6  days   Medical records reviewed and are as summarized below:    Chief Complaint  Patient presents with  . Abnormal Lab       Brief summary   [name] is an 78 y.o. female with medical history significant ofPAD s/p stenting; moderate to severe pulmonary HTN; afib and AVR on Coumadin; HTN; HLD; DM; and CAD s/p CABG presenting with fatigue.Her GI doctor called and told her her blood was 6.6.  Patient was admitted on 2/20 with symptomatic anemia due to upper GI bleeding.   Assessment & Plan    Principal Problem: Acute on chronic symptomatic anemia -Recent admission in 2/20 with anemia, EGD had shown oozing superficial gastric ulcers. -Continues to have recurrent anemia, on Coumadin for mechanical valve.  Had received 1 unit packed RBCs. -Coumadin was placed on hold and GI was consulted.   - EGD showed patchy mildly friable mucosa with contact bleeding in the gastric fundus otherwise normal duodenum, small hiatal hernia.  GI does not feel acute competent of anemia was from GI source. -Hematology consulted, seen by Dr. Jana Hakim, recommended packed RBC transfusion, Feraheme and monthly iron.  Per Dr. Jana Hakim, multifocal anemia likely due to anemia of chronic inflammation, patient has pending rheumatology appointment next week, mild hemolysis, anemia of CKD, likely occult GIB with iron deficiency.   -Status post IV Feraheme on 5/23   -Patient has been now started on Coumadin and on IV heparin bridge due to mechanical valve, H&H stable   Active problems Mechanical aortic valve AVR 2005 Paroxysmal atrial fibrillation  -Currently normal sinus rhythm -Continue IV heparin drip bridge -INR still subtherapeutic but improving 1.8, continue Coumadin per pharmacy   Chronic diastolic CHF -2D echo in 7/61 showed preserved EF and grade 3 diastolic dysfunction -Continue Lasix  H CAP right lung base -CCXR showed hazy lung opacity in the right lung base suspicious for pneumonia, could have aspirated -Continue IV Zithromax and Rocephin.   E. coli UTI Urine culture showed more than 100,000 colonies of E. coli and 80,000 colonies of Klebsiella pneumonia.  Follow sensitivities -Continue IV Rocephin  Essential hypertension -BP elevated, continue hydralazine, Avapro, Lasix, Norvasc 5 mg daily Allergic to Coreg/beta-blockers, verapamil, losartan.   Cannot give Imdur due to being on Cialis for pulmonary hypertension  History of pulmonary hypertension Cardiac cath in 9/19 had shown moderate pulmonary hypertension Patient was started on Adcirca but not clear how much benefit she had, a second medication was not started Repeat echo scheduled for 7/7, has been referred to rheumatology as well   CKD stage III Baseline creatinine 1.2-1.4 Currently at baseline  Diabetes mellitus, type II with complications, CKD -Continue sensitive sliding scale  Code Status: Full code DVT  Prophylaxis: SCDs Family Communication: Discussed in detail with the patient, all imaging results, lab results explained to the patient    Disposition Plan: DC when INR above 2.5, mechanical valve.  Hopefully next 24 to 48 hours.  Time Spent in minutes 25 minutes  Procedures:  EGD  Consultants:   GI, cardiology Hematology  Antimicrobials:   Anti-infectives (From admission, onward)   Start     Dose/Rate Route Frequency Ordered Stop   03/01/19 1800  cefTRIAXone (ROCEPHIN) 1 g in sodium chloride 0.9 % 100 mL IVPB     1 g 200 mL/hr over 30 Minutes Intravenous Every 24 hours 03/01/19 1646     02/28/19 1600  cefTRIAXone (ROCEPHIN) 1 g  in sodium chloride 0.9 % 100 mL IVPB  Status:  Discontinued     1 g 200 mL/hr over 30 Minutes Intravenous Every 24 hours 02/28/19 1545 03/01/19 1646   02/28/19 1600  azithromycin (ZITHROMAX) 500 mg in sodium chloride 0.9 % 250 mL IVPB     500 mg 250 mL/hr over 60 Minutes Intravenous Every 24 hours 02/28/19 1545           Medications  Scheduled Meds: . sodium chloride   Intravenous Once  . ALPRAZolam  0.25 mg Oral QHS  . atorvastatin  80 mg Oral q1800  . Chlorhexidine Gluconate Cloth  6 each Topical Q0600  . docusate sodium  100 mg Oral BID  . furosemide  80 mg Oral Daily  . hydrALAZINE  100 mg Oral TID  . insulin aspart  0-9 Units Subcutaneous TID WC  . irbesartan  300 mg Oral Daily  . magnesium oxide  400 mg Oral QPM  . mupirocin ointment  1 application Nasal BID  . pantoprazole  40 mg Oral BID  . sodium chloride flush  3 mL Intravenous Q12H  . tadalafil  20 mg Oral BID  . warfarin  5 mg Oral ONCE-1800  . Warfarin - Pharmacist Dosing Inpatient   Does not apply q1800   Continuous Infusions: . azithromycin 500 mg (03/02/19 1657)  . cefTRIAXone (ROCEPHIN)  IV 1 g (03/02/19 2111)  . heparin 1,200 Units/hr (03/03/19 1209)   PRN Meds:.acetaminophen **OR** acetaminophen, ondansetron **OR** ondansetron (ZOFRAN) IV      Subjective:   [name] was seen and examined today.  Feeling slight pleuritic chest pain on coughing.  No fevers.  Ambulating in the room.  Patient denies dizziness, abdominal pain, N/V/D/C, new weakness, numbess, tingling.    Objective:   Vitals:   03/02/19 0546 03/02/19 1629 03/02/19 2152 03/03/19 1350  BP: (!) 171/46 (!) 163/46 (!) 168/39 (!) 181/45  Pulse: 69 70 71 64  Resp: 18 17 18 18   Temp: 98.8 F (37.1 C) 97.6 F (36.4 C) 98.7 F (37.1 C) 97.7 F (36.5 C)  TempSrc: Oral Oral Oral Oral  SpO2: 95% 96% 95% 99%  Weight:      Height:        Intake/Output Summary (Last 24 hours) at 03/03/2019 1401 Last data filed at 03/03/2019 1209 Gross  per 24 hour  Intake 120 ml  Output 900 ml  Net -780 ml     Wt Readings from Last 3 Encounters:  02/26/19 58.5 kg  12/11/18 59.8 kg  11/22/18 58.2 kg   Physical Exam  General: Alert and oriented x 3, NAD  Eyes:  HEENT:  Atraumatic, normocephalic  Cardiovascular: S1 S2 clear, RRR. No pedal edema b/l  Respiratory: Decreased breath sound at the bases  Gastrointestinal: Soft, nontender,  nondistended, NBS  Ext: no pedal edema bilaterally  Neuro: no new deficits  Musculoskeletal: No cyanosis, clubbing  Skin: No rashes  Psych: Normal affect and demeanor, alert and oriented x3     Data Reviewed:  I have personally reviewed following labs and imaging studies  Micro Results Recent Results (from the past 240 hour(s))  SARS Coronavirus 2 (CEPHEID - Performed in [place] hospital lab), Hosp Order     Status: None   Collection Time: 02/24/19  2:27 PM  Result Value Ref Range Status   SARS Coronavirus 2 NEGATIVE NEGATIVE Final    Comment: (NOTE) If result is NEGATIVE SARS-CoV-2 target nucleic acids are NOT DETECTED. The SARS-CoV-2 RNA is generally detectable in upper and lower  respiratory specimens during the acute phase of infection. The lowest  concentration of SARS-CoV-2 viral copies this assay can detect is 250  copies / mL. A negative result does not preclude SARS-CoV-2 infection  and should not be used as the sole basis for treatment or other  patient management decisions.  A negative result may occur with  improper specimen collection / handling, submission of specimen other  than nasopharyngeal swab, presence of viral mutation(s) within the  areas targeted by this assay, and inadequate number of viral copies  (<250 copies / mL). A negative result must be combined with clinical  observations, patient history, and epidemiological information. If result is POSITIVE SARS-CoV-2 target nucleic acids are DETECTED. The SARS-CoV-2 RNA is generally detectable in upper  and lower  respiratory specimens dur ing the acute phase of infection.  Positive  results are indicative of active infection with SARS-CoV-2.  Clinical  correlation with patient history and other diagnostic information is  necessary to determine patient infection status.  Positive results do  not rule out bacterial infection or co-infection with other viruses. If result is PRESUMPTIVE POSTIVE SARS-CoV-2 nucleic acids MAY BE PRESENT.   A presumptive positive result was obtained on the submitted specimen  and confirmed on repeat testing.  While 2019 novel coronavirus  (SARS-CoV-2) nucleic acids may be present in the submitted sample  additional confirmatory testing may be necessary for epidemiological  and / or clinical management purposes  to differentiate between  SARS-CoV-2 and other Sarbecovirus currently known to infect humans.  If clinically indicated additional testing with an alternate test  methodology [id number]) is advised. The SARS-CoV-2 RNA is generally  detectable in upper and lower respiratory sp ecimens during the acute  phase of infection. The expected result is Negative. Fact Sheet for Patients:  StrictlyIdeas.no Fact Sheet for Healthcare Providers: BankingDealers.co.za This test is not yet approved or cleared by the [place] FDA and has been authorized for detection and/or diagnosis of SARS-CoV-2 by FDA under an Emergency Use Authorization (EUA).  This EUA will remain in effect (meaning this test can be used) for the duration of the COVID-19 declaration under Section 564(b)(1) of the Act, 21 U.S.C. section 360bbb-3(b)(1), unless the authorization is terminated or revoked sooner. Performed at [facility], [place] [address] [place], [place] [address]   Surgical PCR screen     Status: Abnormal   Collection Time: 02/26/19  9:01 PM  Result Value Ref Range Status   MRSA, PCR NEGATIVE NEGATIVE Final   Staphylococcus  aureus POSITIVE (A) NEGATIVE Final    Comment: (NOTE) The Xpert SA Assay (FDA approved for NASAL specimens in patients 98 years of age and older), is one component of a comprehensive surveillance program. It is not intended to  diagnose infection nor to guide or monitor treatment. Performed at [facility], [place] [address], [place], [place] [address]   Culture, blood (routine x 2)     Status: None (Preliminary result)   Collection Time: 02/28/19  9:51 AM  Result Value Ref Range Status   Specimen Description BLOOD LEFT HAND  Final   Special Requests   Final    BOTTLES DRAWN AEROBIC ONLY Blood Culture adequate volume   Culture   Final    NO GROWTH 3 DAYS Performed at [facility], [place] [address], [place], [place] [address]    Report Status PENDING  Incomplete  Culture, blood (routine x 2)     Status: None (Preliminary result)   Collection Time: 02/28/19  9:51 AM  Result Value Ref Range Status   Specimen Description BLOOD LEFT HAND  Final   Special Requests   Final    BOTTLES DRAWN AEROBIC ONLY Blood Culture adequate volume   Culture   Final    NO GROWTH 3 DAYS Performed at [facility], [place] [address], [place], [place] [address]    Report Status PENDING  Incomplete  Urine Culture     Status: Abnormal (Preliminary result)   Collection Time: 02/28/19  1:50 PM  Result Value Ref Range Status   Specimen Description URINE, RANDOM  Final   Special Requests NONE  Final   Culture (A)  Final    >=100,000 COLONIES/mL ESCHERICHIA COLI 80,000 COLONIES/mL KLEBSIELLA PNEUMONIAE SUSCEPTIBILITIES TO FOLLOW Performed at [facility], [place] [address] [place], [place] [address]    Report Status PENDING  Incomplete    Radiology Reports Dg Chest 2 View  Result Date: 02/28/2019 CLINICAL DATA:  Fever. EXAM: CHEST - 2 VIEW COMPARISON:  Chest radiograph 11/20/2018 and CT 12/12/2018 FINDINGS: Sequelae of CABG and aortic valve replacement are again identified. The cardiac  silhouette is mildly enlarged. There is increased ground-glass opacity in the right lung base. Subtly increased hazy opacity is also present in the right greater than left mid lungs. No pleural effusion or pneumothorax is identified. Calcified granulomas are noted in both lungs. No acute osseous abnormality is seen. IMPRESSION: Hazy lung opacities most notable in the right lung base which are suspicious for pneumonia in this patient with fever (including viral/atypical etiologies). Electronically Signed   By: Logan Bores M.D.   On: 02/28/2019 10:00    Lab Data:  CBC: Recent Labs  Lab 02/24/19 1427  02/27/19 0038 02/28/19 0709 02/28/19 2133 03/01/19 0255 03/02/19 0307 03/03/19 0325  WBC 3.9*   < > 3.1* 3.6*  --  4.4 3.7* 3.4*  NEUTROABS 2.9  --   --   --   --   --   --   --   HGB 7.0*   < > 7.0* 6.8* 8.9* 8.7* 8.5* 8.2*  HCT 22.4*   < > 21.2* 20.4* 26.1* 26.0* 25.3* 24.9*  MCV 94.1   < > 90.2 89.5  --  89.7 90.0 91.9  PLT 186   < > 146* 128*  --  141* 141* 163   < > = values in this interval not displayed.   Basic Metabolic Panel: Recent Labs  Lab 02/26/19 0309 02/27/19 0038 02/28/19 0709 03/01/19 0255 03/02/19 0307  NA 135 132* 134* 135 136  K 4.0 3.9 4.0 3.8 3.6  CL 104 104 106 106 106  CO2 22 19* 20* 21* 20*  GLUCOSE 117* 249* 118* 109* 103*  BUN  42* 40* 29* 31* 28*  CREATININE 1.35* 1.34* 1.29* 1.39* 1.21*  CALCIUM 8.6* 8.4* 8.2* 8.3* 8.4*   GFR: Estimated Creatinine Clearance: 30.5 mL/min (A) (by C-G formula based on SCr of 1.21 mg/dL (H)). Liver Function Tests: Recent Labs  Lab 02/25/19 0312  AST 19  ALT 11  ALKPHOS 69  BILITOT 1.2  PROT 6.2*  ALBUMIN 3.0*   No results for input(s): LIPASE, AMYLASE in the last 168 hours. No results for input(s): AMMONIA in the last 168 hours. Coagulation Profile: Recent Labs  Lab 02/27/19 0038 02/28/19 0709 03/01/19 0255 03/02/19 0307 03/03/19 0325  INR 1.7* 1.4* 1.2 1.2 1.8*   Cardiac Enzymes: Recent Labs  Lab  02/24/19 1427  TROPONINI <0.03   BNP (last 3 results) No results for input(s): PROBNP in the last 8760 hours. HbA1C: No results for input(s): HGBA1C in the last 72 hours. CBG: Recent Labs  Lab 03/02/19 1238 03/02/19 1624 03/02/19 2150 03/03/19 0817 03/03/19 1200  GLUCAP 132* 185* 119* 111* 151*   Lipid Profile: No results for input(s): CHOL, HDL, LDLCALC, TRIG, CHOLHDL, LDLDIRECT in the last 72 hours. Thyroid Function Tests: No results for input(s): TSH, T4TOTAL, FREET4, T3FREE, THYROIDAB in the last 72 hours. Anemia Panel: No results for input(s): VITAMINB12, FOLATE, FERRITIN, TIBC, IRON, RETICCTPCT in the last 72 hours. Urine analysis:    Component Value Date/Time   COLORURINE YELLOW 02/28/2019 0855   APPEARANCEUR HAZY (A) 02/28/2019 0855   LABSPEC 1.011 02/28/2019 0855   PHURINE 6.0 02/28/2019 0855   GLUCOSEU NEGATIVE 02/28/2019 0855   HGBUR MODERATE (A) 02/28/2019 0855   BILIRUBINUR NEGATIVE 02/28/2019 0855   KETONESUR NEGATIVE 02/28/2019 0855   PROTEINUR 100 (A) 02/28/2019 0855   UROBILINOGEN 1.0 10/06/2011 1709   NITRITE NEGATIVE 02/28/2019 0855   LEUKOCYTESUR MODERATE (A) 02/28/2019 0855     Ripudeep Rai M.D. [facility] 03/03/2019, 2:01 PM  Pager: [phone] Between 7am to 7pm - call Pager - [phone]  After 7pm go to [email or web address] - password [email or web address]  Call night coverage person covering after 7pm

## 2019-03-04 DIAGNOSIS — I480 Paroxysmal atrial fibrillation: Secondary | ICD-10-CM

## 2019-03-04 LAB — CBC
HCT: 24.1 % — ABNORMAL LOW (ref 36.0–46.0)
Hemoglobin: 7.9 g/dL — ABNORMAL LOW (ref 12.0–15.0)
MCH: 30 pg (ref 26.0–34.0)
MCHC: 32.8 g/dL (ref 30.0–36.0)
MCV: 91.6 fL (ref 80.0–100.0)
Platelets: 151 10*3/uL (ref 150–400)
RBC: 2.63 MIL/uL — ABNORMAL LOW (ref 3.87–5.11)
RDW: 14 % (ref 11.5–15.5)
WBC: 4.2 10*3/uL (ref 4.0–10.5)
nRBC: 0 % (ref 0.0–0.2)

## 2019-03-04 LAB — GLUCOSE, CAPILLARY
Glucose-Capillary: 121 mg/dL — ABNORMAL HIGH (ref 70–99)
Glucose-Capillary: 172 mg/dL — ABNORMAL HIGH (ref 70–99)
Glucose-Capillary: 154 mg/dL — ABNORMAL HIGH (ref 70–99)
Glucose-Capillary: 137 mg/dL — ABNORMAL HIGH (ref 70–99)

## 2019-03-04 LAB — PROTIME-INR
INR: 2.2 — ABNORMAL HIGH (ref 0.8–1.2)
Prothrombin Time: 23.8 seconds — ABNORMAL HIGH (ref 11.4–15.2)

## 2019-03-04 LAB — HEPARIN LEVEL (UNFRACTIONATED): Heparin Unfractionated: 0.32 IU/mL (ref 0.30–0.70)

## 2019-03-04 MED ORDER — WARFARIN SODIUM 5 MG PO TABS
5.0000 mg | ORAL_TABLET | Freq: Once | ORAL | Status: AC
  Administered 2019-03-04: 17:00:00 5 mg via ORAL
  Filled 2019-03-04: qty 1

## 2019-03-04 MED ORDER — LEVOFLOXACIN 500 MG PO TABS
500.0000 mg | ORAL_TABLET | Freq: Every day | ORAL | Status: DC
  Administered 2019-03-04 – 2019-03-05 (×2): 500 mg via ORAL
  Filled 2019-03-04 (×2): qty 1

## 2019-03-04 MED ORDER — METOPROLOL TARTRATE 12.5 MG HALF TABLET
12.5000 mg | ORAL_TABLET | Freq: Two times a day (BID) | ORAL | Status: DC
  Administered 2019-03-04 – 2019-03-05 (×2): 12.5 mg via ORAL
  Filled 2019-03-04 (×2): qty 1

## 2019-03-04 NOTE — Care Management Important Message (Signed)
Important Message  Patient Details  Name: [name] MRN: [id number] Date of Birth: 30-Mar-1941   Medicare Important Message Given:  Yes    Orbie Pyo 03/04/2019, 3:21 PM

## 2019-03-04 NOTE — Progress Notes (Signed)
Progress Note  Patient Name: [name] Date of Encounter: 03/04/2019  Primary Cardiologist: Quay Burow, MD   Subjective   Complaining of congestion in throat and epistaxis in recent past. Feels like something is wrong in back of her throat   Inpatient Medications    Scheduled Meds: . sodium chloride   Intravenous Once  . ALPRAZolam  0.25 mg Oral QHS  . amLODipine  5 mg Oral Daily  . atorvastatin  80 mg Oral q1800  . Chlorhexidine Gluconate Cloth  6 each Topical Q0600  . docusate sodium  100 mg Oral BID  . furosemide  80 mg Oral Daily  . hydrALAZINE  100 mg Oral TID  . insulin aspart  0-9 Units Subcutaneous TID WC  . irbesartan  300 mg Oral Daily  . magnesium oxide  400 mg Oral QPM  . pantoprazole  40 mg Oral BID  . sodium chloride flush  3 mL Intravenous Q12H  . tadalafil  20 mg Oral BID  . Warfarin - Pharmacist Dosing Inpatient   Does not apply q1800   Continuous Infusions: . azithromycin 500 mg (03/03/19 1557)  . cefTRIAXone (ROCEPHIN)  IV 1 g (03/03/19 2004)  . heparin 1,200 Units/hr (03/03/19 1209)   PRN Meds: acetaminophen **OR** acetaminophen, ondansetron **OR** ondansetron (ZOFRAN) IV   Vital Signs    Vitals:   03/03/19 1603 03/03/19 1714 03/03/19 2143 03/04/19 0625  BP: (!) 170/57 (!) 134/41 (!) 181/67 (!) 186/50  Pulse: 71 67 91 83  Resp:  19 16 15   Temp:   98.3 F (36.8 C) 98.6 F (37 C)  TempSrc:   Oral Oral  SpO2:  98% 94% 92%  Weight:      Height:        Intake/Output Summary (Last 24 hours) at 03/04/2019 0852 Last data filed at 03/03/2019 1844 Gross per 24 hour  Intake 735.52 ml  Output -  Net 735.52 ml   Last 3 Weights 02/26/2019 12/11/2018 11/22/2018  Weight (lbs) 129 lb 131 lb 12.8 oz 128 lb 4.8 oz  Weight (kg) 58.514 kg 59.784 kg 58.196 kg      Telemetry    NSR - Personally Reviewed  ECG    No new tracing - Personally Reviewed  Physical Exam  Appears well, just pale GEN: No acute distress.   Neck: No JVD Cardiac:  RRR, crisp prosthetic valve clicks, 2/6 Ao ejection murmur is early peaking, no diastolic murmurs, rubs, or gallops.  Respiratory: Clear to auscultation bilaterally. GI: Soft, nontender, non-distended  MS: No edema; No deformity. Neuro:  Nonfocal  Psych: Normal affect   Labs    Chemistry Recent Labs  Lab 02/28/19 0709 03/01/19 0255 03/02/19 0307  NA 134* 135 136  K 4.0 3.8 3.6  CL 106 106 106  CO2 20* 21* 20*  GLUCOSE 118* 109* 103*  BUN 29* 31* 28*  CREATININE 1.29* 1.39* 1.21*  CALCIUM 8.2* 8.3* 8.4*  GFRNONAA 39* 36* 42*  GFRAA 45* 41* 49*  ANIONGAP 8 8 10      Hematology Recent Labs  Lab 03/02/19 0307 03/03/19 0325 03/04/19 0319  WBC 3.7* 3.4* 4.2  RBC 2.81* 2.71* 2.63*  HGB 8.5* 8.2* 7.9*  HCT 25.3* 24.9* 24.1*  MCV 90.0 91.9 91.6  MCH 30.2 30.3 30.0  MCHC 33.6 32.9 32.8  RDW 13.8 14.1 14.0  PLT 141* 163 151    Cardiac Enzymes No results for input(s): TROPONINI in the last 168 hours. No results for input(s): TROPIPOC in the  last 168 hours.   BNPNo results for input(s): BNP, PROBNP in the last 168 hours.   DDimer No results for input(s): DDIMER in the last 168 hours.   Radiology    No results found.  Cardiac Studies   Right Heart Catheterization 06/27/2018: 1. Mildly elevated PCWP, normal RA pressure.  2. Preserved cardiac output.  3. Moderate pulmonary arterial hypertension. PVR is only 3.1 WU due to wide PA pulse pressure.   Send V/Q scan to rule out chronic PEs, arrange for PFTs. Will need to consider treatment of PAH given RV dysfunction by echo. _______________  Echocardiogram 02/24/2018: Study Conclusions: - Left ventricle: The cavity size was normal. Wall thickness was normal. Systolic function was normal. The estimated ejection fraction was in the range of 55% to 60%. Wall motion was normal; there were no regional wall motion abnormalities. Doppler parameters are consistent with a reversible restrictive pattern,  indicative of decreased left ventricular diastolic compliance and/or increased left atrial pressure (grade 3 diastolic dysfunction). - Ventricular septum: The contour showed diastolic flattening. - Aortic valve: A mechanical prosthesis was present and functioning normally. Peak velocity (S): 238 cm/s. Mean gradient (S): 10 mm Hg. Valve area (VTI): 2.24 cm^2. Valve area (Vmax): 2.26 cm^2. Valve area (Vmean): 1.88 cm^2. - Mitral valve: Calcified annulus. There was mild regurgitation. Valve area by continuity equation (using LVOT flow): 2.53 cm^2. - Left atrium: The atrium was moderately to severely dilated. - Right ventricle: The cavity size was moderately dilated. Wall thickness was normal. Systolic function was moderately to severely reduced. - Right atrium: The atrium was severely dilated. - Tricuspid valve: There was severe regurgitation. - Pulmonary arteries: Systolic pressure was severely increased. PA peak pressure: 88 mm Hg (S).  Impressions: - When compared to prior, PA pressures have increased  Patient Profile     78 y.o. female with recurrentacute symptomatic anemia, chronic warfarin anticoagulation for mechanical AVR, chronic diastolic heart failure, paroxysmal atrial fibrillation, moderate to severe PAH (possibly due to collagen vascular disease)  Assessment & Plan    1. Mechanical AVR and anticoagulation: INR 2.2 this am would continue heparin for 24 hours overlap then d/c  2. CAD s/p CABG: no angina despite anemia 3. Anemia: Dr. Jana Hakim pointed out that this is multifactorial, with elements of chronic inflammatory disease and EPO deficiency in addition to Fe deficiency. 4. Parox AFib: maintaining SR. 5. CHF: clinically euvolemic. 6. PAH: - Right heart cath in 06/2018 showed moderate pulmonary arterial hypertension with PVR of only 3.1 due to wide PA pulse pressure. - PFTs in 07/2018 were unremarkable. - Continue Adcirca 40mg  daily. - Per Dr.  Claris Gladden last office visit note, "possible group 1 PH component though this is a borderline call as PVR is not significantly elevated. ANA and RF positive, as is CCP Ab. Possible rheumatoid arthritis. CT chest showed subtle bilateral ground glass opacities in 3/20, this could be due to RA lung effects."  Will sign off Will arrange outpatient w/u with Dr [name]          For questions or updates, please contact [place] Please consult [email or web address] for contact info under        Signed, Jenkins Rouge, MD  03/04/2019, 8:52 AM

## 2019-03-04 NOTE — Progress Notes (Signed)
Patient Demographics [name], is a 78 y.o. female, DOB - 1941/08/26, VXB:[id number] Admit date - 02/24/2019   Admitting Physician Karmen Bongo, MD Outpatient Primary MD for the patient is [name], [name], MD Outpatient specialists:  LOS - 7  days    Brief summary   FLORENA [name] is an 78 y.o. female with medical history significant ofPAD s/p stenting; moderate to severe pulmonary HTN; afib and AVR on Coumadin; HTN; HLD; DM; and CAD s/p CABG presenting with fatigue.Her GI doctor called and told her her blood was 6.6.  Patient was admitted on 2/20 with symptomatic anemia due to upper GI bleeding.   Assessment & Plan    Acute on chronic symptomatic anemia -Recent admission in 2/20 with anemia, EGD had shown oozing superficial gastric ulcers. -Continues to have recurrent anemia, on Coumadin for mechanical valve.  Had received 1 unit packed RBCs. -Coumadin was placed on hold and GI was consulted.  ---- EGD showed patchy mildly friable mucosa with contact bleeding in the gastric fundus otherwise normal duodenum, small hiatal hernia.  GI does not feel acute competent of anemia was from GI source. -Hematology-Dr. Magrinat, recommended- RBC transfusion, Feraheme and monthly iron. " multifocal anemia likely due to anemia of chronic inflammation, patient has pending rheumatology appointment next week, mild hemolysis, anemia of CKD, likely occult GIB with iron deficiency"  -Status post IV Feraheme on 5/23   -Patient has been now started on Coumadin and on IV heparin bridge due to mechanical valve, H&H stable--patient will need overlap of heparin and Coumadin and INR at least 2.5 prior to discharge  Mechanical aortic valve AVR 2005 Paroxysmal atrial fibrillation -Currently normal sinus rhythm -Continue IV heparin drip bridge -INR still subtherapeutic and repeat labs a.m.  Chronic diastolic CHF -2D echo in  5/19 showed preserved EF and grade 3 diastolic dysfunction -Continue Lasix 80 mg daily, hydralazine 100 3 times daily, amlodipine 5, benazepril 300 daily -Her allergy to beta-blocker is swelling I am adding metoprolol 12.5 twice daily   H CAP right lung base -CCXR showed hazy lung opacity in the right lung base suspicious for pneumonia, could have aspirated -Transitioning from azithromycin/Rocephin-->Levaquin as this will cover below urinary issues as well and both organisms are sensitive to quinolones  E. coli UTI-see above discussion Urine culture showed more than 100,000 colonies of E. coli and 80,000 colonies of Klebsiella pneumonia.   Essential hypertension -BP elevated, see above discussion Cannot give Imdur due to being on Cialis for pulmonary hypertension  History of pulmonary hypertension Cardiac cath in 9/19 had shown moderate pulmonary hypertension Patient was started on Adcirca but not clear how much benefit she had, a second medication was not started Repeat echo scheduled for 7/7, has been referred to rheumatology as well  CKD stage III Baseline creatinine 1.2-1.4 Currently at baseline  Diabetes mellitus, type II with complications, CKD -Continue sensitive sliding scale-sugars are 120s to 170s  Code Status: Full code DVT Prophylaxis: SCDs Family Communication: Discussed in detail with the patient, all imaging results, lab results explained  to the patient  Disposition Plan: DC when INR above 2.5, mechanical valve.  Hopefully next 24 to 48 hours.  Time Spent in minutes 25 minutes  Procedures:  EGD  Consultants:   GI, cardiology Hematology  Antimicrobials:   Anti-infectives (From admission, onward)   Start     Dose/Rate Route Frequency Ordered Stop   03/01/19 1800  cefTRIAXone (ROCEPHIN) 1 g in sodium chloride 0.9 % 100 mL IVPB     1 g 200 mL/hr over 30 Minutes Intravenous Every 24 hours 03/01/19 1646     02/28/19 1600  cefTRIAXone (ROCEPHIN) 1 g in sodium  chloride 0.9 % 100 mL IVPB  Status:  Discontinued     1 g 200 mL/hr over 30 Minutes Intravenous Every 24 hours 02/28/19 1545 03/01/19 1646   02/28/19 1600  azithromycin (ZITHROMAX) 500 mg in sodium chloride 0.9 % 250 mL IVPB     500 mg 250 mL/hr over 60 Minutes Intravenous Every 24 hours 02/28/19 1545           Medications  Scheduled Meds: . sodium chloride   Intravenous Once  . ALPRAZolam  0.25 mg Oral QHS  . amLODipine  5 mg Oral Daily  . atorvastatin  80 mg Oral q1800  . Chlorhexidine Gluconate Cloth  6 each Topical Q0600  . docusate sodium  100 mg Oral BID  . furosemide  80 mg Oral Daily  . hydrALAZINE  100 mg Oral TID  . insulin aspart  0-9 Units Subcutaneous TID WC  . irbesartan  300 mg Oral Daily  . magnesium oxide  400 mg Oral QPM  . pantoprazole  40 mg Oral BID  . sodium chloride flush  3 mL Intravenous Q12H  . tadalafil  20 mg Oral BID  . warfarin  5 mg Oral ONCE-1800  . Warfarin - Pharmacist Dosing Inpatient   Does not apply q1800   Continuous Infusions: . azithromycin 500 mg (03/04/19 1514)  . cefTRIAXone (ROCEPHIN)  IV 1 g (03/03/19 2004)  . heparin 1,200 Units/hr (03/04/19 0932)   PRN Meds:.acetaminophen **OR** acetaminophen, ondansetron **OR** ondansetron (ZOFRAN) IV      Subjective:   Awake alert pleasant no distress sitting up at side of bed does not like the food Seems pretty strong and able to do for self tells me that she takes care of herself and lives at home  Objective:   Vitals:   03/03/19 2143 03/04/19 0625 03/04/19 1421 03/04/19 1603  BP: (!) 181/67 (!) 186/50 (!) 173/49 (!) 165/56  Pulse: 91 83 80 75  Resp: 16 15 18    Temp: 98.3 F (36.8 C) 98.6 F (37 C) 97.7 F (36.5 C)   TempSrc: Oral Oral Oral   SpO2: 94% 92% 97%   Weight:      Height:        Intake/Output Summary (Last 24 hours) at 03/04/2019 1643 Last data filed at 03/04/2019 1514 Gross per 24 hour  Intake 704.11 ml  Output -  Net 704.11 ml     Wt Readings from  Last 3 Encounters:  02/26/19 58.5 kg  12/11/18 59.8 kg  11/22/18 58.2 kg   Physical Exam  Pleasant coherent in no distress  EOMI NCAT  S1-S2 no murmur rub or gallop telemetry reviewed shows PVCs mild tachycardia  Chest is clear  Abdomen soft nontender  Trace lower extremity edema  External ocular movements intact  Data Reviewed:  I have personally reviewed following labs and imaging studies  Micro Results   Radiology  Reports Dg Chest 2 View  Result Date: 02/28/2019 CLINICAL DATA:  Fever. EXAM: CHEST - 2 VIEW COMPARISON:  Chest radiograph 11/20/2018 and CT 12/12/2018 FINDINGS: Sequelae of CABG and aortic valve replacement are again identified. The cardiac silhouette is mildly enlarged. There is increased ground-glass opacity in the right lung base. Subtly increased hazy opacity is also present in the right greater than left mid lungs. No pleural effusion or pneumothorax is identified. Calcified granulomas are noted in both lungs. No acute osseous abnormality is seen. IMPRESSION: Hazy lung opacities most notable in the right lung base which are suspicious for pneumonia in this patient with fever (including viral/atypical etiologies). Electronically Signed   By: Logan Bores M.D.   On: 02/28/2019 10:00    Lab Data:  CBC: Recent Labs  Lab 02/28/19 0709 02/28/19 2133 03/01/19 0255 03/02/19 0307 03/03/19 0325 03/04/19 0319  WBC 3.6*  --  4.4 3.7* 3.4* 4.2  HGB 6.8* 8.9* 8.7* 8.5* 8.2* 7.9*  HCT 20.4* 26.1* 26.0* 25.3* 24.9* 24.1*  MCV 89.5  --  89.7 90.0 91.9 91.6  PLT 128*  --  141* 141* 163 562   Basic Metabolic Panel: Recent Labs  Lab 02/26/19 0309 02/27/19 0038 02/28/19 0709 03/01/19 0255 03/02/19 0307  NA 135 132* 134* 135 136  K 4.0 3.9 4.0 3.8 3.6  CL 104 104 106 106 106  CO2 22 19* 20* 21* 20*  GLUCOSE 117* 249* 118* 109* 103*  BUN 42* 40* 29* 31* 28*  CREATININE 1.35* 1.34* 1.29* 1.39* 1.21*  CALCIUM 8.6* 8.4* 8.2* 8.3* 8.4*   GFR: Estimated  Creatinine Clearance: 30.5 mL/min (A) (by C-G formula based on SCr of 1.21 mg/dL (H)). Liver Function Tests: No results for input(s): AST, ALT, ALKPHOS, BILITOT, PROT, ALBUMIN in the last 168 hours. No results for input(s): LIPASE, AMYLASE in the last 168 hours. No results for input(s): AMMONIA in the last 168 hours. Coagulation Profile: Recent Labs  Lab 02/28/19 0709 03/01/19 0255 03/02/19 0307 03/03/19 0325 03/04/19 0319  INR 1.4* 1.2 1.2 1.8* 2.2*   Cardiac Enzymes: No results for input(s): CKTOTAL, CKMB, CKMBINDEX, TROPONINI in the last 168 hours. BNP (last 3 results) No results for input(s): PROBNP in the last 8760 hours. HbA1C: No results for input(s): HGBA1C in the last 72 hours. CBG: Recent Labs  Lab 03/03/19 1200 03/03/19 1705 03/03/19 2145 03/04/19 0759 03/04/19 1143  GLUCAP 151* 180* 148* 121* 172*   Lipid Profile: No results for input(s): CHOL, HDL, LDLCALC, TRIG, CHOLHDL, LDLDIRECT in the last 72 hours. Thyroid Function Tests: No results for input(s): TSH, T4TOTAL, FREET4, T3FREE, THYROIDAB in the last 72 hours. Anemia Panel: No results for input(s): VITAMINB12, FOLATE, FERRITIN, TIBC, IRON, RETICCTPCT in the last 72 hours. Urine analysis:    Component Value Date/Time   COLORURINE YELLOW 02/28/2019 0855   APPEARANCEUR HAZY (A) 02/28/2019 0855   LABSPEC 1.011 02/28/2019 0855   PHURINE 6.0 02/28/2019 0855   GLUCOSEU NEGATIVE 02/28/2019 0855   HGBUR MODERATE (A) 02/28/2019 0855   BILIRUBINUR NEGATIVE 02/28/2019 0855   KETONESUR NEGATIVE 02/28/2019 0855   PROTEINUR 100 (A) 02/28/2019 0855   UROBILINOGEN 1.0 10/06/2011 1709   NITRITE NEGATIVE 02/28/2019 0855   LEUKOCYTESUR MODERATE (A) 02/28/2019 0855     Verneita Griffes, MD [facility] 4:43 PM

## 2019-03-04 NOTE — Progress Notes (Signed)
ANTICOAGULATION CONSULT NOTE - Follow-Up Consult  Pharmacy Consults for Warfarin, Heparin Indication: Hx mech AVR + Afib  Vital Signs: Temp: 98.6 F (37 C) (05/26 0625) Temp Source: Oral (05/26 0625) BP: 186/50 (05/26 0625) Pulse Rate: 83 (05/26 0625)   Heparin Dosing Wt:  58.5 kg  Labs: Recent Labs    03/02/19 0307 03/03/19 0325 03/04/19 0319  HGB 8.5* 8.2* 7.9*  HCT 25.3* 24.9* 24.1*  PLT 141* 163 151  LABPROT 14.9 20.2* 23.8*  INR 1.2 1.8* 2.2*  HEPARINUNFRC 0.33 0.39 0.32  CREATININE 1.21*  --   --     Estimated Creatinine Clearance: 30.5 mL/min (A) (by C-G formula based on SCr of 1.21 mg/dL (H)).   Medical History: Past Medical History:  Diagnosis Date  . Aortic atherosclerosis ([place]) 01/23/2018  . Atrial fibrillation ([place])   . Carotid artery disease ([place])   . Cholelithiasis 01/23/2018  . Chronic anticoagulation   . Coronary artery disease    status post coronary artery bypass grafting times 07/10/2004  . Diabetes mellitus   . Hypercholesteremia   . Hypertension   . Mechanical heart valve present    H. aortic valve replacement at the time of bypass surgery October 2005  . Moderate to severe pulmonary hypertension ([place])   . Peripheral arterial disease ([facility])    history of left common iliac artery PTA and stenting for a chronic total occlusion 08/26/01    Assessment: 78 yr old female on warfarin PTA for hx mech AVR + Afib. Per chart review - noted INR goal reduction from 2.5-3.5 to 2-3 in June 2019. Admit INR 3.2 on PTA dose, noted symptomatic anemia and recent GIB. Pharmacy consulted for warfarin and heparin dosing this admission. EGD done 5/21 showed friable gastric mucosa, small bowel oozing. May continue anticoagulation per GI.  PTA warfarin dose: 7.5 mg daily except 5 mg M/F.  Heparin level therapeutic with AM labs on 1200 units/hr  Warfarin resumed 5/23. INR now therapeutic at 2.2.  Per cards, continue heparin x 24 hours overlap with therapeutic  INR.  CBC stable. No overt bleeding noted  Goals of Therapy: INR goal: 2-3 (per [facility] clinic), 2.5-3.5 (per MD notes this admission) Heparin level:  0.3-0.5 (lower d/t anemia and w/u) Monitor platelets by anticoagulation protocol: Yes   Plan:  Continue heparin gtt at 1200 units/hr Give warfarin 5mg  PO x1 tonight Daily INR, heparin level, CBC, s/s bleeding  [employer], Pharm.D., BCPS Clinical Pharmacist Clinical phone for 03/04/2019 from 8:30-4:00 is [phone].  **Pharmacist phone directory can now be found on [email or web address] ([email or web address]).  Listed under [place].  03/04/2019 12:04 PM

## 2019-03-04 NOTE — Progress Notes (Signed)
Notified MD about patient's high BP (173/49). Will continue to monitor.

## 2019-03-05 DIAGNOSIS — I4811 Longstanding persistent atrial fibrillation: Secondary | ICD-10-CM

## 2019-03-05 LAB — CBC WITH DIFFERENTIAL/PLATELET
Abs Immature Granulocytes: 0.02 10*3/uL (ref 0.00–0.07)
Basophils Absolute: 0 10*3/uL (ref 0.0–0.1)
Basophils Relative: 1 %
Eosinophils Absolute: 0.1 10*3/uL (ref 0.0–0.5)
Eosinophils Relative: 2 %
HCT: 22.6 % — ABNORMAL LOW (ref 36.0–46.0)
Hemoglobin: 7.5 g/dL — ABNORMAL LOW (ref 12.0–15.0)
Immature Granulocytes: 1 %
Lymphocytes Relative: 16 %
Lymphs Abs: 0.6 10*3/uL — ABNORMAL LOW (ref 0.7–4.0)
MCH: 30.7 pg (ref 26.0–34.0)
MCHC: 33.2 g/dL (ref 30.0–36.0)
MCV: 92.6 fL (ref 80.0–100.0)
Monocytes Absolute: 0.2 10*3/uL (ref 0.1–1.0)
Monocytes Relative: 6 %
Neutro Abs: 2.8 10*3/uL (ref 1.7–7.7)
Neutrophils Relative %: 74 %
Platelets: 171 10*3/uL (ref 150–400)
RBC: 2.44 MIL/uL — ABNORMAL LOW (ref 3.87–5.11)
RDW: 14.2 % (ref 11.5–15.5)
WBC: 3.7 10*3/uL — ABNORMAL LOW (ref 4.0–10.5)
nRBC: 0 % (ref 0.0–0.2)

## 2019-03-05 LAB — BASIC METABOLIC PANEL
Anion gap: 9 (ref 5–15)
BUN: 19 mg/dL (ref 8–23)
CO2: 22 mmol/L (ref 22–32)
Calcium: 8.2 mg/dL — ABNORMAL LOW (ref 8.9–10.3)
Chloride: 105 mmol/L (ref 98–111)
Creatinine, Ser: 1.15 mg/dL — ABNORMAL HIGH (ref 0.44–1.00)
GFR calc Af Amer: 52 mL/min — ABNORMAL LOW (ref >60)
GFR calc non Af Amer: 45 mL/min — ABNORMAL LOW (ref >60)
Glucose, Bld: 111 mg/dL — ABNORMAL HIGH (ref 70–99)
Potassium: 3.7 mmol/L (ref 3.5–5.1)
Sodium: 136 mmol/L (ref 135–145)

## 2019-03-05 LAB — GLUCOSE, CAPILLARY
Glucose-Capillary: 114 mg/dL — ABNORMAL HIGH (ref 70–99)
Glucose-Capillary: 143 mg/dL — ABNORMAL HIGH (ref 70–99)

## 2019-03-05 LAB — PROTIME-INR
INR: 2.1 — ABNORMAL HIGH (ref 0.8–1.2)
Prothrombin Time: 23.7 seconds — ABNORMAL HIGH (ref 11.4–15.2)

## 2019-03-05 LAB — HEPARIN LEVEL (UNFRACTIONATED): Heparin Unfractionated: 0.35 IU/mL (ref 0.30–0.70)

## 2019-03-05 LAB — CULTURE, BLOOD (ROUTINE X 2)
Culture: NO GROWTH
Culture: NO GROWTH
Special Requests: ADEQUATE
Special Requests: ADEQUATE

## 2019-03-05 LAB — MAGNESIUM: Magnesium: 1.8 mg/dL (ref 1.7–2.4)

## 2019-03-05 LAB — PATHOLOGIST SMEAR REVIEW

## 2019-03-05 MED ORDER — WARFARIN SODIUM 7.5 MG PO TABS: 7.5000 mg | ORAL_TABLET | Freq: Once | ORAL | Status: DC

## 2019-03-05 MED ORDER — METOPROLOL TARTRATE 25 MG PO TABS: 12.5000 mg | ORAL_TABLET | Freq: Two times a day (BID) | ORAL | 0 refills | Status: DC

## 2019-03-05 MED ORDER — AMLODIPINE BESYLATE 5 MG PO TABS: 5.0000 mg | ORAL_TABLET | Freq: Every day | ORAL | 0 refills | Status: DC

## 2019-03-05 MED ORDER — LEVOFLOXACIN 500 MG PO TABS: 500.0000 mg | ORAL_TABLET | Freq: Every day | ORAL | 0 refills | Status: AC

## 2019-03-05 NOTE — Progress Notes (Signed)
ANTICOAGULATION CONSULT NOTE - Follow-Up Consult  Pharmacy Consults for Heparin >> Warfarin Indication: Hx mech AVR + Afib  Vital Signs: Temp: 98.5 F (36.9 C) (05/27 0501) Temp Source: Oral (05/27 0501) BP: 163/42 (05/27 0501) Pulse Rate: 78 (05/27 0501)   Heparin Dosing Wt:  58.5 kg  Labs: Recent Labs    03/03/19 0325 03/04/19 0319 03/05/19 0250  HGB 8.2* 7.9* 7.5*  HCT 24.9* 24.1* 22.6*  PLT 163 151 171  LABPROT 20.2* 23.8* 23.7*  INR 1.8* 2.2* 2.1*  HEPARINUNFRC 0.39 0.32 0.35  CREATININE  --   --  1.15*    Estimated Creatinine Clearance: 32.1 mL/min (A) (by C-G formula based on SCr of 1.15 mg/dL (H)).   Medical History: Past Medical History:  Diagnosis Date  . Aortic atherosclerosis ([place]) 01/23/2018  . Atrial fibrillation ([place])   . Carotid artery disease ([place])   . Cholelithiasis 01/23/2018  . Chronic anticoagulation   . Coronary artery disease    status post coronary artery bypass grafting times 07/10/2004  . Diabetes mellitus   . Hypercholesteremia   . Hypertension   . Mechanical heart valve present    H. aortic valve replacement at the time of bypass surgery October 2005  . Moderate to severe pulmonary hypertension ([place])   . Peripheral arterial disease ([facility])    history of left common iliac artery PTA and stenting for a chronic total occlusion 08/26/01    Assessment: 78 yr old female on warfarin PTA for hx mech AVR + Afib. Per chart review - noted INR goal reduction from 2.5-3.5 to 2-3 in June 2019. Admit INR 3.2 on PTA dose, noted symptomatic anemia and recent GIB. Pharmacy consulted for warfarin and heparin dosing this admission. EGD done 5/21 showed friable gastric mucosa, small bowel oozing. May continue anticoagulation per GI.  Warfarin resumed 5/23.  PTA warfarin dose: 7.5 mg daily except 5 mg M/F.  Heparin level therapeutic with AM labs on 1200 units/hr.  INR remains therapeutic today.  Pt has completed 24 hour overlap with therapeutic INR per cards  recomendations.  Will d/c heparin today.  CBC stable. No overt bleeding noted  Goals of Therapy: INR goal: 2-3 (reduced goal per [facility] clinic) Monitor platelets by anticoagulation protocol: Yes   Plan:  Discontinue heparin Give warfarin 7.5mg  PO x1 tonight Daily INR, heparin level, CBC, s/s bleeding  [employer], Pharm.D., BCPS Clinical Pharmacist Clinical phone for 03/05/2019 from 8:30-4:00 is [phone].  **Pharmacist phone directory can now be found on [email or web address] ([email or web address]).  Listed under [place].  03/05/2019 9:50 AM

## 2019-03-05 NOTE — Discharge Summary (Signed)
Physician Discharge Summary  [name] QMV:[id number] DOB: 10-11-1940 DOA: 02/24/2019  PCP: [name], MD  Admit date: 02/24/2019 Discharge date: 03/05/2019  Admitted From: Home Disposition:  Home  Recommendations for Outpatient Follow-up:  1. Follow up with PCP in 1 week 2. Please obtain CBC in one week 3. Continue Levaquin for UTI/PNA 4. Started on metoprolol and amlodipine for poorly controlled HTN 5. Patient to follow-up with hematology/oncology for Feraheme/iron infusion on 03/20/2019 6. Pending rheumatology referral 7. Follow-up with cardiology, Dr. Alvester Chou as scheduled  Home Health: No Equipment/Devices: None  Discharge Condition: Stable CODE STATUS: Full code Diet recommendation: Heart healthy, carb modified  History of present illness:  [name] is a 78 y.o. female with medical history significant of PAD s/p stenting; moderate to severe pulmonary HTN; afib and AVR on Coumadin; HTN; HLD; DM; and CAD s/p CABG presenting with fatigue. Her GI doctor called and told her her blood was 6.6 after having blood drawn this AM.  She was told to come today or tomorrow; her son was available to bring her and so she came today.  He said she needed blood.  Her hemoglobin has been going down for the last few days.  She has been feeling bad - just no energy, wanting to sleep more, SOB with exertion.  She takes iron and so they are always dark.    She was admitted in 2/20 with symptomatic anemia 2* to upper GI bleeding. Hgb at that time was 7.2.  She received 2 units PRBC and was started on a Protonix drip.  She had an EGD with few minimally oozing superficial gastric ulcers and unremarkable capsule endoscopy.   ED Course:  H/o UGI bleed, on Coumadin.  Fatigue, Hgb 6-7 today. Symptomatic anemia, will transfuse 1 unit.  Stool very dark, on iron, heme negative.  Hospital course:  Acute on chronic symptomatic anemia Recent admission in 2/20 with anemia, EGD had shown oozing  superficial gastric ulcers. Continues to have recurrent anemia, on Coumadin for mechanical valve.  Had received 1 unit packed RBCs. Coumadin was placed on hold and GI was consulted. EGD showed patchy mildly friable mucosa with contact bleeding in the gastric fundus otherwise normal duodenum, small hiatal hernia.  GI does not feel acute competent of anemia was from GI source. Hematology, Dr. Jana [name], recommended blood transfusion, Feraheme and monthly iron.  Received IV Feraheme on 03/01/2019.  Patient will be seen in the hematology outpatient clinic for monthly iron infusions, started on 03/20/2019.  Mechanical aortic valve AVR 2005 Paroxysmal atrial fibrillation Patient noted to be currently in sinus rhythm.  Patient's Coumadin was held with heparin bridge for EGD as above.  INR now therapeutic for greater than 24 hours while on heparin drip.  Goal INR has been reduced from 2.5-3.5 to 2.0-3.0 by patient's Coumadin clinic back in June 2019.  Recommend patient follow-up with Coumadin clinic and cardiology following hospitalization.  Chronic diastolic CHF 2D echo in 2/84 showed preserved EF and grade 3 diastolic dysfunction Continue Lasix 80 mg daily, hydralazine 100 TID, amlodipine 5, benazepril 300 daily. Started on metoprolol 12.5mg  PO BID.   HCAP right lung base CXR showed hazy lung opacity in the right lung base suspicious for pneumonia, could have aspirated.  Initially started on azithromycin and ceftriaxone, transition to Levaquin to cover both urinary track and pneumonia.    E. coli UTI Urine culture showed more than 100,000 colonies of E. coli and 80,000 colonies of Klebsiella pneumonia.  New antibiotics  of Levaquin.  Essential hypertension BP elevated, Cannot give Imdur due to being on Cialis for pulmonary hypertension.  Continue furosemide, hydralazine, amlodipine, benazepril.  Started on metoprolol 12.5 mg p.o. twice daily.  Follow-up with PCP versus cardiology for further  titration.  History of pulmonary hypertension Cardiac cath in 9/19 had shown moderate pulmonary hypertension Patient was started on Adcirca but not clear how much benefit she had, a second medication was not started Repeat echo scheduled for 7/7, has been referred to rheumatology as well.  CKD stage III Baseline creatinine 1.2-1.4.  Creatinine 1.15 at time of discharge, at baseline.  Diabetes mellitus, type II with complications, CKD Continue home metformin.  Discharge Diagnoses:  Principal Problem:   Anemia associated with stage 3 chronic renal failure (HCC) Active Problems:   Chronic anticoagulation   Atrial fibrillation (HCC)   Chronic diastolic CHF (congestive heart failure) (HCC)   Diabetes mellitus type 2, controlled (HCC)   HTN (hypertension)   H/O mechanical aortic valve replacement   Moderate to severe pulmonary hypertension (HCC)   CKD (chronic kidney disease) stage 3, GFR 30-59 ml/min [facility])    Discharge Instructions  Discharge Instructions    (HEART FAILURE PATIENTS) Call MD:  Anytime you have any of the following symptoms: 1) 3 pound weight gain in 24 hours or 5 pounds in 1 week 2) shortness of breath, with or without a dry hacking cough 3) swelling in the hands, feet or stomach 4) if you have to sleep on extra pillows at night in order to breathe.   Complete by:  As directed    Call MD for:  difficulty breathing, headache or visual disturbances   Complete by:  As directed    Call MD for:  persistant dizziness or light-headedness   Complete by:  As directed    Call MD for:  persistant nausea and vomiting   Complete by:  As directed    Call MD for:  severe uncontrolled pain   Complete by:  As directed    Call MD for:  temperature >100.4   Complete by:  As directed    Diet - low sodium heart healthy   Complete by:  As directed    Increase activity slowly   Complete by:  As directed      Allergies as of 03/05/2019      Reactions   Flagyl [metronidazole]  Rash   ALL-OVER BODY RASH   Coreg [carvedilol] Other (See Comments)   Terrible cramping in the feet and had a lot of bowel movements, but not diarrhea   Losartan Swelling   Patient doesn't recall site of swelling   Verapamil Hives   Zetia [ezetimibe] Other (See Comments)   Reaction not recalled   Zocor [simvastatin - High Dose] Other (See Comments)   Reaction not recalled      Medication List    TAKE these medications   acetaminophen 325 MG tablet Commonly known as:  TYLENOL Take 2 tablets (650 mg total) by mouth every 6 (six) hours as needed for mild pain, fever or headache (for pain or headaches). What changed:  how much to take   ALPRAZolam 0.5 MG tablet Commonly known as:  XANAX Take 0.25 mg by mouth at bedtime.   amLODipine 5 MG tablet Commonly known as:  NORVASC Take 1 tablet (5 mg total) by mouth daily. Start taking on:  Mar 06, 2019   atorvastatin 80 MG tablet Commonly known as:  LIPITOR Take 1 tablet (80 mg total) by  mouth daily at 6 PM.   Colcrys 0.6 MG tablet Generic drug:  colchicine Take 0.6 mg by mouth daily as needed (gout flare).   ferrous sulfate 325 (65 FE) MG tablet Take 1 tablet (325 mg total) by mouth 2 (two) times daily with a meal.   furosemide 40 MG tablet Commonly known as:  LASIX Take 80 mg by mouth daily.   hydrALAZINE 100 MG tablet Commonly known as:  APRESOLINE TAKE 1 TABLET (100 MG TOTAL) BY MOUTH 3 (THREE) TIMES DAILY.   irbesartan 300 MG tablet Commonly known as:  AVAPRO TAKE 1 TABLET DAILY (PLEASE MAKE APPOINTMENT FOR REFILLS) What changed:  See the new instructions.   levofloxacin 500 MG tablet Commonly known as:  LEVAQUIN Take 1 tablet (500 mg total) by mouth daily for 4 days. Start taking on:  Mar 06, 2019   magnesium oxide 400 (241.3 Mg) MG tablet Commonly known as:  MAG-OX Take 1 tablet (400 mg total) by mouth daily. What changed:  when to take this   metFORMIN 500 MG tablet Commonly known as:  GLUCOPHAGE Take 500  mg by mouth 2 (two) times daily with a meal.   metoprolol tartrate 25 MG tablet Commonly known as:  LOPRESSOR Take 0.5 tablets (12.5 mg total) by mouth 2 (two) times daily.   ondansetron 4 MG tablet Commonly known as:  ZOFRAN Take 4 mg by mouth every 8 (eight) hours as needed for nausea or vomiting.   pantoprazole 40 MG tablet Commonly known as:  PROTONIX Take 40 mg by mouth 2 (two) times daily.   spironolactone 25 MG tablet Commonly known as:  ALDACTONE Take 1 tablet (25 mg total) by mouth daily.   tadalafil 20 MG tablet Commonly known as:  CIALIS Take 1 tablet (20 mg total) by mouth 2 (two) times daily.   vitamin B-12 100 MCG tablet Commonly known as:  CYANOCOBALAMIN Take 100 mcg by mouth daily at 12 noon.   Vitamin D3 25 MCG (1000 UT) Caps Take 1,000 Units by mouth daily. 12 noon   warfarin 5 MG tablet Commonly known as:  COUMADIN Take as directed. If you are unsure how to take this medication, talk to your nurse or doctor. Original instructions:  Take 1-1.5 tablets (5-7.5 mg total) by mouth See admin instructions. Take 5 mg on Monday and Friday take in the morning Tale 7.5 mg all the other days      Follow-up Information    [name], [name], MD. Schedule an appointment as soon as possible for a visit in 1 week(s).   Specialty:  Internal Medicine Contact information: [place] [address] [phone]        Lorretta Harp, MD .   Specialties:  Cardiology, Radiology Contact information: [address] [place] [address] [place] [address] [phone]          Allergies  Allergen Reactions  . Flagyl [Metronidazole] Rash    ALL-OVER BODY RASH  . Coreg [Carvedilol] Other (See Comments)    Terrible cramping in the feet and had a lot of bowel movements, but not diarrhea  . Losartan Swelling    Patient doesn't recall site of swelling  . Verapamil Hives  . Zetia [Ezetimibe] Other (See Comments)    Reaction not recalled  . Zocor  [Simvastatin - High Dose] Other (See Comments)    Reaction not recalled    Consultations:  [facility], Dr. Michail Sermon, Dr. Paulita Fujita  Hematology/oncology, Dr. Jana [name]   Procedures/Studies: Dg Chest 2  View  Result Date: 02/28/2019 CLINICAL DATA:  Fever. EXAM: CHEST - 2 VIEW COMPARISON:  Chest radiograph 11/20/2018 and CT 12/12/2018 FINDINGS: Sequelae of CABG and aortic valve replacement are again identified. The cardiac silhouette is mildly enlarged. There is increased ground-glass opacity in the right lung base. Subtly increased hazy opacity is also present in the right greater than left mid lungs. No pleural effusion or pneumothorax is identified. Calcified granulomas are noted in both lungs. No acute osseous abnormality is seen. IMPRESSION: Hazy lung opacities most notable in the right lung base which are suspicious for pneumonia in this patient with fever (including viral/atypical etiologies). Electronically Signed   By: Logan Bores M.D.   On: 02/28/2019 10:00    EGD 02/27/2019: Impression: - Small hiatal hernia - Friable gastric mucosa. - Normal duodenal bulb, first portion of the duodenum and second portion of the duodenum. - Overall, patient likely has some degree of generalized mucosal friability which, over time, could lead to blood loss. However, as this is a patchy and more generalized process, it is not amenable to endoscopic intervention. Patient may also have, for the same reason, some small bowel oozing that has not been appreciated.   Subjective: Patient seen and examined at bedside, currently brushing teeth.  INR now therapeutic greater than 24 hours on heparin bridge.  Hemoglobin stable.  No other concerns/complaints this morning.  Ready for discharge home.  Denies headache, no dizziness, no visual changes, no chest pain, no palpitations, no shortness of breath, no fever/chills/night sweats, no nausea/vomiting/diarrhea, no cough/congestion, no abdominal pain.  No acute events  overnight per nursing staff.   Discharge Exam: Vitals:   03/05/19 0501 03/05/19 1305  BP: (!) 163/42 (!) 137/49  Pulse: 78 69  Resp: 15 20  Temp: 98.5 F (36.9 C) 98.1 F (36.7 C)  SpO2: (!) 87% 98%   Vitals:   03/04/19 1603 03/04/19 2224 03/05/19 0501 03/05/19 1305  BP: (!) 165/56 (!) 155/41 (!) 163/42 (!) 137/49  Pulse: 75 87 78 69  Resp:  15 15 20   Temp:  98.2 F (36.8 C) 98.5 F (36.9 C) 98.1 F (36.7 C)  TempSrc:  Oral Oral Oral  SpO2:  (!) 87% (!) 87% 98%  Weight:      Height:        General: Pt is alert, awake, not in acute distress Cardiovascular: RRR, S1/S2 +, no rubs, no gallops Respiratory: CTA bilaterally, no wheezing, no rhonchi Abdominal: Soft, NT, ND, bowel sounds + Extremities: no edema, no cyanosis    The results of significant diagnostics from this hospitalization (including imaging, microbiology, ancillary and laboratory) are listed below for reference.     Microbiology: Recent Results (from the past 240 hour(s))  SARS Coronavirus 2 (CEPHEID - Performed in [place] hospital lab), Hosp Order     Status: None   Collection Time: 02/24/19  2:27 PM  Result Value Ref Range Status   SARS Coronavirus 2 NEGATIVE NEGATIVE Final    Comment: (NOTE) If result is NEGATIVE SARS-CoV-2 target nucleic acids are NOT DETECTED. The SARS-CoV-2 RNA is generally detectable in upper and lower  respiratory specimens during the acute phase of infection. The lowest  concentration of SARS-CoV-2 viral copies this assay can detect is 250  copies / mL. A negative result does not preclude SARS-CoV-2 infection  and should not be used as the sole basis for treatment or other  patient management decisions.  A negative result may occur with  improper specimen collection / handling,  submission of specimen other  than nasopharyngeal swab, presence of viral mutation(s) within the  areas targeted by this assay, and inadequate number of viral copies  (<250 copies / mL). A  negative result must be combined with clinical  observations, patient history, and epidemiological information. If result is POSITIVE SARS-CoV-2 target nucleic acids are DETECTED. The SARS-CoV-2 RNA is generally detectable in upper and lower  respiratory specimens dur ing the acute phase of infection.  Positive  results are indicative of active infection with SARS-CoV-2.  Clinical  correlation with patient history and other diagnostic information is  necessary to determine patient infection status.  Positive results do  not rule out bacterial infection or co-infection with other viruses. If result is PRESUMPTIVE POSTIVE SARS-CoV-2 nucleic acids MAY BE PRESENT.   A presumptive positive result was obtained on the submitted specimen  and confirmed on repeat testing.  While 2019 novel coronavirus  (SARS-CoV-2) nucleic acids may be present in the submitted sample  additional confirmatory testing may be necessary for epidemiological  and / or clinical management purposes  to differentiate between  SARS-CoV-2 and other Sarbecovirus currently known to infect humans.  If clinically indicated additional testing with an alternate test  methodology [phone]) is advised. The SARS-CoV-2 RNA is generally  detectable in upper and lower respiratory sp ecimens during the acute  phase of infection. The expected result is Negative. Fact Sheet for Patients:  StrictlyIdeas.no Fact Sheet for Healthcare Providers: BankingDealers.co.za This test is not yet approved or cleared by the [place] FDA and has been authorized for detection and/or diagnosis of SARS-CoV-2 by FDA under an Emergency Use Authorization (EUA).  This EUA will remain in effect (meaning this test can be used) for the duration of the COVID-19 declaration under Section 564(b)(1) of the Act, 21 U.S.C. section 360bbb-3(b)(1), unless the authorization is terminated or revoked sooner. Performed  at [facility], [place] [address] [place], [place] [address]   Surgical PCR screen     Status: Abnormal   Collection Time: 02/26/19  9:01 PM  Result Value Ref Range Status   MRSA, PCR NEGATIVE NEGATIVE Final   Staphylococcus aureus POSITIVE (A) NEGATIVE Final    Comment: (NOTE) The Xpert SA Assay (FDA approved for NASAL specimens in patients 14 years of age and older), is one component of a comprehensive surveillance program. It is not intended to diagnose infection nor to guide or monitor treatment. Performed at [facility], [place] [address], [place], [place] [address]   Culture, blood (routine x 2)     Status: None   Collection Time: 02/28/19  9:51 AM  Result Value Ref Range Status   Specimen Description BLOOD LEFT HAND  Final   Special Requests   Final    BOTTLES DRAWN AEROBIC ONLY Blood Culture adequate volume   Culture   Final    NO GROWTH 5 DAYS Performed at [facility], [place] [address] [place], [place] [address]    Report Status 03/05/2019 FINAL  Final  Culture, blood (routine x 2)     Status: None   Collection Time: 02/28/19  9:51 AM  Result Value Ref Range Status   Specimen Description BLOOD LEFT HAND  Final   Special Requests   Final    BOTTLES DRAWN AEROBIC ONLY Blood Culture adequate volume   Culture   Final    NO GROWTH 5 DAYS Performed at [facility], [place] [address], [place], [place] [address]  Report Status 03/05/2019 FINAL  Final  Urine Culture     Status: Abnormal   Collection Time: 02/28/19  1:50 PM  Result Value Ref Range Status   Specimen Description URINE, RANDOM  Final   Special Requests   Final    NONE Performed at [facility], [place] [address] [place], [place] [address]    Culture (A)  Final    >=100,000 COLONIES/mL ESCHERICHIA COLI 80,000 COLONIES/mL KLEBSIELLA PNEUMONIAE    Report Status 03/03/2019 FINAL  Final   Organism ID, Bacteria ESCHERICHIA COLI (A)  Final   Organism ID, Bacteria KLEBSIELLA PNEUMONIAE  (A)  Final      Susceptibility   Escherichia coli - MIC*    AMPICILLIN <=2 SENSITIVE Sensitive     CEFAZOLIN <=4 SENSITIVE Sensitive     CEFTRIAXONE <=1 SENSITIVE Sensitive     CIPROFLOXACIN <=0.25 SENSITIVE Sensitive     GENTAMICIN <=1 SENSITIVE Sensitive     IMIPENEM <=0.25 SENSITIVE Sensitive     NITROFURANTOIN <=16 SENSITIVE Sensitive     TRIMETH/SULFA <=20 SENSITIVE Sensitive     AMPICILLIN/SULBACTAM <=2 SENSITIVE Sensitive     PIP/TAZO <=4 SENSITIVE Sensitive     Extended ESBL NEGATIVE Sensitive     * >=100,000 COLONIES/mL ESCHERICHIA COLI   Klebsiella pneumoniae - MIC*    AMPICILLIN >=32 RESISTANT Resistant     CEFAZOLIN <=4 SENSITIVE Sensitive     CEFTRIAXONE <=1 SENSITIVE Sensitive     CIPROFLOXACIN <=0.25 SENSITIVE Sensitive     GENTAMICIN <=1 SENSITIVE Sensitive     IMIPENEM 0.5 SENSITIVE Sensitive     NITROFURANTOIN 64 INTERMEDIATE Intermediate     TRIMETH/SULFA <=20 SENSITIVE Sensitive     AMPICILLIN/SULBACTAM >=32 RESISTANT Resistant     PIP/TAZO <=4 SENSITIVE Sensitive     Extended ESBL NEGATIVE Sensitive     * 80,000 COLONIES/mL KLEBSIELLA PNEUMONIAE     Labs: BNP (last 3 results) Recent Labs    03/06/18 1352 07/23/18 1117  BNP 790.8* 099.8*   Basic Metabolic Panel: Recent Labs  Lab 02/27/19 0038 02/28/19 0709 03/01/19 0255 03/02/19 0307 03/05/19 0250  NA 132* 134* 135 136 136  K 3.9 4.0 3.8 3.6 3.7  CL 104 106 106 106 105  CO2 19* 20* 21* 20* 22  GLUCOSE 249* 118* 109* 103* 111*  BUN 40* 29* 31* 28* 19  CREATININE 1.34* 1.29* 1.39* 1.21* 1.15*  CALCIUM 8.4* 8.2* 8.3* 8.4* 8.2*  MG  --   --   --   --  1.8   Liver Function Tests: No results for input(s): AST, ALT, ALKPHOS, BILITOT, PROT, ALBUMIN in the last 168 hours. No results for input(s): LIPASE, AMYLASE in the last 168 hours. No results for input(s): AMMONIA in the last 168 hours. CBC: Recent Labs  Lab 03/01/19 0255 03/02/19 0307 03/03/19 0325 03/04/19 0319 03/05/19 0250  WBC 4.4  3.7* 3.4* 4.2 3.7*  NEUTROABS  --   --   --   --  2.8  HGB 8.7* 8.5* 8.2* 7.9* 7.5*  HCT 26.0* 25.3* 24.9* 24.1* 22.6*  MCV 89.7 90.0 91.9 91.6 92.6  PLT 141* 141* 163 151 171   Cardiac Enzymes: No results for input(s): CKTOTAL, CKMB, CKMBINDEX, TROPONINI in the last 168 hours. BNP: Invalid input(s): POCBNP CBG: Recent Labs  Lab 03/04/19 1143 03/04/19 1656 03/04/19 2223 03/05/19 0746 03/05/19 1214  GLUCAP 172* 154* 137* 114* 143*   D-Dimer No results for input(s): DDIMER in the last 72 hours. Hgb A1c No results for input(s):  HGBA1C in the last 72 hours. Lipid Profile No results for input(s): CHOL, HDL, LDLCALC, TRIG, CHOLHDL, LDLDIRECT in the last 72 hours. Thyroid function studies No results for input(s): TSH, T4TOTAL, T3FREE, THYROIDAB in the last 72 hours.  Invalid input(s): FREET3 Anemia work up No results for input(s): VITAMINB12, FOLATE, FERRITIN, TIBC, IRON, RETICCTPCT in the last 72 hours. Urinalysis    Component Value Date/Time   COLORURINE YELLOW 02/28/2019 0855   APPEARANCEUR HAZY (A) 02/28/2019 0855   LABSPEC 1.011 02/28/2019 0855   PHURINE 6.0 02/28/2019 0855   GLUCOSEU NEGATIVE 02/28/2019 0855   HGBUR MODERATE (A) 02/28/2019 0855   BILIRUBINUR NEGATIVE 02/28/2019 0855   KETONESUR NEGATIVE 02/28/2019 0855   PROTEINUR 100 (A) 02/28/2019 0855   UROBILINOGEN 1.0 10/06/2011 1709   NITRITE NEGATIVE 02/28/2019 0855   LEUKOCYTESUR MODERATE (A) 02/28/2019 0855   Sepsis Labs Invalid input(s): PROCALCITONIN,  WBC,  LACTICIDVEN Microbiology Recent Results (from the past 240 hour(s))  SARS Coronavirus 2 (CEPHEID - Performed in [place] hospital lab), Hosp Order     Status: None   Collection Time: 02/24/19  2:27 PM  Result Value Ref Range Status   SARS Coronavirus 2 NEGATIVE NEGATIVE Final    Comment: (NOTE) If result is NEGATIVE SARS-CoV-2 target nucleic acids are NOT DETECTED. The SARS-CoV-2 RNA is generally detectable in upper and lower  respiratory  specimens during the acute phase of infection. The lowest  concentration of SARS-CoV-2 viral copies this assay can detect is 250  copies / mL. A negative result does not preclude SARS-CoV-2 infection  and should not be used as the sole basis for treatment or other  patient management decisions.  A negative result may occur with  improper specimen collection / handling, submission of specimen other  than nasopharyngeal swab, presence of viral mutation(s) within the  areas targeted by this assay, and inadequate number of viral copies  (<250 copies / mL). A negative result must be combined with clinical  observations, patient history, and epidemiological information. If result is POSITIVE SARS-CoV-2 target nucleic acids are DETECTED. The SARS-CoV-2 RNA is generally detectable in upper and lower  respiratory specimens dur ing the acute phase of infection.  Positive  results are indicative of active infection with SARS-CoV-2.  Clinical  correlation with patient history and other diagnostic information is  necessary to determine patient infection status.  Positive results do  not rule out bacterial infection or co-infection with other viruses. If result is PRESUMPTIVE POSTIVE SARS-CoV-2 nucleic acids MAY BE PRESENT.   A presumptive positive result was obtained on the submitted specimen  and confirmed on repeat testing.  While 2019 novel coronavirus  (SARS-CoV-2) nucleic acids may be present in the submitted sample  additional confirmatory testing may be necessary for epidemiological  and / or clinical management purposes  to differentiate between  SARS-CoV-2 and other Sarbecovirus currently known to infect humans.  If clinically indicated additional testing with an alternate test  methodology [id number]) is advised. The SARS-CoV-2 RNA is generally  detectable in upper and lower respiratory sp ecimens during the acute  phase of infection. The expected result is Negative. Fact Sheet for  Patients:  StrictlyIdeas.no Fact Sheet for Healthcare Providers: BankingDealers.co.za This test is not yet approved or cleared by the [place] FDA and has been authorized for detection and/or diagnosis of SARS-CoV-2 by FDA under an Emergency Use Authorization (EUA).  This EUA will remain in effect (meaning this test can be used) for the duration of the COVID-19 declaration under  Section 564(b)(1) of the Act, 21 U.S.C. section 360bbb-3(b)(1), unless the authorization is terminated or revoked sooner. Performed at [facility], [place] [address], [place], [place] [address]   Surgical PCR screen     Status: Abnormal   Collection Time: 02/26/19  9:01 PM  Result Value Ref Range Status   MRSA, PCR NEGATIVE NEGATIVE Final   Staphylococcus aureus POSITIVE (A) NEGATIVE Final    Comment: (NOTE) The Xpert SA Assay (FDA approved for NASAL specimens in patients 55 years of age and older), is one component of a comprehensive surveillance program. It is not intended to diagnose infection nor to guide or monitor treatment. Performed at [facility], [place] [address], [place], [place] [address]   Culture, blood (routine x 2)     Status: None   Collection Time: 02/28/19  9:51 AM  Result Value Ref Range Status   Specimen Description BLOOD LEFT HAND  Final   Special Requests   Final    BOTTLES DRAWN AEROBIC ONLY Blood Culture adequate volume   Culture   Final    NO GROWTH 5 DAYS Performed at [facility], [place] [address], [place], [place] [address]    Report Status 03/05/2019 FINAL  Final  Culture, blood (routine x 2)     Status: None   Collection Time: 02/28/19  9:51 AM  Result Value Ref Range Status   Specimen Description BLOOD LEFT HAND  Final   Special Requests   Final    BOTTLES DRAWN AEROBIC ONLY Blood Culture adequate volume   Culture   Final    NO GROWTH 5 DAYS Performed at [facility], [place] [address]  [place], [place] [address]    Report Status 03/05/2019 FINAL  Final  Urine Culture     Status: Abnormal   Collection Time: 02/28/19  1:50 PM  Result Value Ref Range Status   Specimen Description URINE, RANDOM  Final   Special Requests   Final    NONE Performed at [facility], [place] [address] [place], [place] [address]    Culture (A)  Final    >=100,000 COLONIES/mL ESCHERICHIA COLI 80,000 COLONIES/mL KLEBSIELLA PNEUMONIAE    Report Status 03/03/2019 FINAL  Final   Organism ID, Bacteria ESCHERICHIA COLI (A)  Final   Organism ID, Bacteria KLEBSIELLA PNEUMONIAE (A)  Final      Susceptibility   Escherichia coli - MIC*    AMPICILLIN <=2 SENSITIVE Sensitive     CEFAZOLIN <=4 SENSITIVE Sensitive     CEFTRIAXONE <=1 SENSITIVE Sensitive     CIPROFLOXACIN <=0.25 SENSITIVE Sensitive     GENTAMICIN <=1 SENSITIVE Sensitive     IMIPENEM <=0.25 SENSITIVE Sensitive     NITROFURANTOIN <=16 SENSITIVE Sensitive     TRIMETH/SULFA <=20 SENSITIVE Sensitive     AMPICILLIN/SULBACTAM <=2 SENSITIVE Sensitive     PIP/TAZO <=4 SENSITIVE Sensitive     Extended ESBL NEGATIVE Sensitive     * >=100,000 COLONIES/mL ESCHERICHIA COLI   Klebsiella pneumoniae - MIC*    AMPICILLIN >=32 RESISTANT Resistant     CEFAZOLIN <=4 SENSITIVE Sensitive     CEFTRIAXONE <=1 SENSITIVE Sensitive     CIPROFLOXACIN <=0.25 SENSITIVE Sensitive     GENTAMICIN <=1 SENSITIVE Sensitive     IMIPENEM 0.5 SENSITIVE Sensitive     NITROFURANTOIN 64 INTERMEDIATE Intermediate     TRIMETH/SULFA <=20 SENSITIVE Sensitive     AMPICILLIN/SULBACTAM >=32 RESISTANT Resistant     PIP/TAZO <=4 SENSITIVE Sensitive  Extended ESBL NEGATIVE Sensitive     * 80,000 COLONIES/mL KLEBSIELLA PNEUMONIAE     Time coordinating discharge: Over 30 minutes  SIGNED:    J [place] DO  [facility] 03/05/2019, 1:47 PM

## 2019-03-06 ENCOUNTER — Telehealth: Payer: Self-pay | Admitting: Oncology

## 2019-03-06 ENCOUNTER — Other Ambulatory Visit: Payer: Self-pay | Admitting: *Deleted

## 2019-03-06 ENCOUNTER — Other Ambulatory Visit (HOSPITAL_COMMUNITY): Payer: Self-pay | Admitting: Cardiology

## 2019-03-06 NOTE — Consult Note (Signed)
  Follow-up note:  Patient was discharged home yesterday. Called and spoke with patient over the phone (HIPAA verified) to follow-up post discharge needs. Patient had been outreached by [facility] care management coordinators in the past and been engaged by [facility] for HF.  Ms. [name] a 78 y.o.femalewith medical history significant ofPAD s/p stenting; moderate to severe pulmonary HTN; afib and AVR on Coumadin; HTN; HLD; DM; and CAD s/p CABG-   She presented with fatigue.Her GI doctor called and told her that her blood (hgb) was 6.6 after having blood drawn and needed blood. Her hemoglobin has been going down, she had been feeling bad - just no energy, wanting to sleep more, SOB with exertion- she is taking iron.   She was admitted for further treatment of acute on chronic symptomatic anemia; also treated with E. coli UTI, HCAP-right lung base pneumonia, HTN, Chronic diastolic HF.  Patient confirmed that her current primary insurance is [employer]. She reports living alone but has 3 children who live close by who are able to assist and support her when needed, also has a neighbor Cytogeneticist) she can depend on. She denies any concerns or needs with medications, (self manages using "[facility]") pharmacy [facility], [employer] and [facility]) and transportation (sonThurmond Butts). Patient, however, had voiced interest with [facility] care management services to help manage her health conditions to prevent readmissions.  Referral made for [facility] community care coordinator for complex care, disease management and assess further needs.   For questions and additional information, please contact:  Salina Stanfield A. Pilot Prindle, BSN, RN-BC [facility] Liaison Cell: [phone]

## 2019-03-06 NOTE — Progress Notes (Signed)
[place]  [phone] [phone]    ID: [name] DOB: 1940-11-26  MR#: [id number]  KJZ#:[id number]  Patient Care Team: [name] Pao, MD as PCP - General (Internal Medicine) [name] Harp, MD as PCP - Cardiology (Cardiology) [name] David, RN as [place] Management [name], [name] Dad, MD as Consulting Physician (Hematology and Oncology) [name] Silence, MD as Consulting Physician (Gastroenterology) [name] Dresser, MD as Consulting Physician (Cardiology) [name], DPM as Consulting Physician (Podiatry) [name] Pound, MD as Consulting Physician (Rheumatology) OTHER MD:   CHIEF COMPLAINT: anemia and thrombocytopenia  CURRENT TREATMENT: Feraheme, [aranesp]   HISTORY OF CURRENT ILLNESS: From the original consult note:  [name] is an 78 y.o. female from [place], [place].  She has a past medical history significant forPAD s/p stenting; moderate to severe pulmonary HTN; afib and AVR on Coumadin; HTN; HLD; DM; and CAD s/p CABG. the patient was admitted to the hospital for anemia.  She was having increasing fatigue and shortness of breath and had routine lab work drawn which showed a hemoglobin of 6.6.  Due to her abnormal lab work, she was advised to come to the emergency room for admission.  She was also admitted in February 2020 for symptomatic anemia.  She reports she has been anemic for at least a year and has been taking oral iron.  She underwent a colonoscopy and EGD on 01/22/2018.  The colonoscopy showed diverticulosis in the sigmoid colon and descending colon, internal hemorrhoids.  The upper endoscopy was normal except for erythematous mucosa in the antrum.  She had a repeat EGD on 11/21/2018 that showed a few minimally oozing( with scope trauma) superficial gastric ulcers with pigmented material were found in the gastric body. The largest lesion was 4 mm in largest dimension.  She  underwent another upper endoscopy on 02/27/2019 which showed patchy mildly friable mucosa with contact bleeding was found in the gastric fundus, in the gastric body and in the gastric antrum.  The patient is maintained on warfarin due to A. fib and a mechanical aortic valve.  Review of the patient's chart shows that she has been anemic dating back to at least December thousand 12 (these are the earliest labs available to me).  Her highest hemoglobin was 11.6 in December 2012.  I also note that the patient has had intermittent mild leukopenia adding back to at least April 2019.  Her lowest white blood cell count was 2.6 in April 2019.  Since admission, the patient has received 1 unit of packed red blood cells.  She is currently on heparin for her procedure with plan to bridge back to Coumadin.  Stool for occult blood was negative x1 on 02/24/2019.  Hematology was asked see the patient to make recommendations regarding her anemia.  [name] subsequent history is as detailed below.   INTERVAL HISTORY: [name] was seen today for follow-up and treatment of her anemia and thrombocytopenia.  Her son [name] participated by speaker phone  She is scheduled to receive her second Feraheme iron infusion on 03/20/2019.  She received her first infusion on 03/01/2019, with no side effects that she is aware of  Her appointment with rheumatology unfortunately had to be postponed and has been moved to 03/27/2019   REVIEW OF SYSTEMS: [name] is feeling a bit weak today. Since she's been home, she has been fairly inactive. Her appetite has been off and even the smell of food has put her off. She  did eat an egg, a piece of toast and a slice of tomato this morning.  She was prescribed amlodipine during her hospitalization but she has not picked up this medication.  She has not had a fall in the past several weeks as part of the COVID-19, she stays at home; her children or neighbors grocery shop for her. For exercise,  she tries to work her legs and arms out to the best of her ability. She does her own cooking and housework. She will occasionally hire someone to do some heavy cleaning. [name] notes some pneumonia while she was hospitalized.   The patient denies unusual headaches, visual changes, vomiting, or dizziness. There has been no unusual, phlegm production, or pleurisy. This been no change in bowel or bladder habits. The patient denies unexplained weight loss, bleeding, rash, or fever. A detailed review of systems was otherwise noncontributory.    PAST MEDICAL HISTORY: Past Medical History:  Diagnosis Date   Aortic atherosclerosis ([facility]) 01/23/2018   Atrial fibrillation (H[facility])    Carotid artery disease ([facility])    Cholelithiasis 01/23/2018   Chronic anticoagulation    Coronary artery disease    status post coronary artery bypass grafting times 07/10/2004   Diabetes mellitus    Hypercholesteremia    Hypertension    Mechanical heart valve present    H. aortic valve replacement at the time of bypass surgery October 2005   Moderate to severe pulmonary hypertension ([facility])    Peripheral arterial disease ([facility])    history of left common iliac artery PTA and stenting for a chronic total occlusion 08/26/01     PAST SURGICAL HISTORY: Past Surgical History:  Procedure Laterality Date   AORTIC VALVE REPLACEMENT     CARDIAC CATHETERIZATION  11/10/2004   40% right common illiac, 70% in stent restenosis of distal left common illiac,    CARDIAC CATHETERIZATION  05/18/2004   LAD 50-70% midstenosis, RCA dominant w/50% stenosis, 50% Right common Illiac artery ostial stenosis, 90% in stent restenosis within midportion of left common illiac stent   Carotid Duplex  03/12/2012   RSA-elev. velocities suggestive of a 50-69% diameter reduction, Right&Left Bulb/Prox ICA-mild-mod.fibrous plaqueelevating Velocities abnormal study.   CHOLECYSTECTOMY N/A 03/01/2018   Procedure: LAPAROSCOPIC CHOLECYSTECTOMY;   Surgeon: [name] Horn, MD;  Location: [facility];  Service: General;  Laterality: N/A;   COLONOSCOPY WITH PROPOFOL N/A 01/22/2018   Procedure: COLONOSCOPY WITH PROPOFOL;  Surgeon: [name] Corner, MD;  Location: [facility];  Service: Endoscopy;  Laterality: N/A;   ESOPHAGOGASTRODUODENOSCOPY (EGD) WITH PROPOFOL N/A 01/22/2018   Procedure: ESOPHAGOGASTRODUODENOSCOPY (EGD) WITH PROPOFOL;  Surgeon: [name] Corner, MD;  Location: [facility];  Service: Endoscopy;  Laterality: N/A;   ESOPHAGOGASTRODUODENOSCOPY (EGD) WITH PROPOFOL N/A 11/21/2018   Procedure: ESOPHAGOGASTRODUODENOSCOPY (EGD) WITH PROPOFOL;  Surgeon: [name], MD;  Location: [facility];  Service: Gastroenterology;  Laterality: N/A;   ESOPHAGOGASTRODUODENOSCOPY (EGD) WITH PROPOFOL Left 02/27/2019   Procedure: ESOPHAGOGASTRODUODENOSCOPY (EGD) WITH PROPOFOL;  Surgeon: [name] Silence, MD;  Location[facility] END[facility]COPY;  Service: Endoscopy;  Laterality: Left;   GIVENS CAPSULE STUDY N/A 11/21/2018   Procedure: GIVENS CAPSULE STUDY;  Surgeon: [name], MD;  Location: [facility];  Service: Gastroenterology;  Laterality: N/A;  To be deployed during EGD   Lower Ext. Duplex  03/12/2012   Right Proximal CIA- vessel narrowing w/elevated velocities 0-49% diameter reduction. Right SFA-mild mixed density plaque throughout vessel.   NM MYOCAR PERF WALL MOTION  05/19/2010   protocol: Persantine, post stress EF 65%, negative for ischemia, low risk scan  RIGHT HEART CATH N/A 06/27/2018   Procedure: RIGHT HEART CATH;  Surgeon: [name] Dresser, MD;  Location: [facility];  Service: Cardiovascular;  Laterality: N/A;   TRANSTHORACIC ECHOCARDIOGRAM  08/29/2012   Moderately calcified annulus of mitral valve, moderate regurg. of both mitral valve and tricuspid valve.      FAMILY HISTORY: Family History  Problem Relation Age of Onset   Breast cancer Neg Hx     GYNECOLOGIC HISTORY:  No LMP recorded. Patient has had a  hysterectomy. Menarche:  years old Age at first live birth:  [place] P: 3 LMP:  Contraceptive:  HRT:   Hysterectomy?: yes BSO?:    SOCIAL HISTORY: (Current as of 03/07/2019) [name] is widowed.  She lives alone here in [place], [place].  She has 3 children that live locally.  She smoked three quarters of a pack of cigarettes daily for 50 years.  She quit in 2005.  Denies alcohol use.   ADVANCED DIRECTIVES: Not in place    HEALTH MAINTENANCE: Social History   Tobacco Use   Smoking status: Former Smoker    Packs/day: 1.00    Years: 30.00    Pack years: 30.00    Types: Cigarettes   Smokeless tobacco: Never Used  Substance Use Topics   Alcohol use: No    Alcohol/week: 0.0 standard drinks   Drug use: No    Colonoscopy: EGD on 02/27/2019 under Dr. Paulita Fujita found some friable gastric mucosa.  Colonoscopy under Dr. Michail Sermon 01/22/2018 showed significant diverticular disease  PAP:   Bone density:  Mammography: 02/25/2017, breast density category B.  Allergies  Allergen Reactions   Flagyl [Metronidazole] Rash    ALL-OVER BODY RASH   Coreg [Carvedilol] Other (See Comments)    Terrible cramping in the feet and had a lot of bowel movements, but not diarrhea   Losartan Swelling    Patient doesn't recall site of swelling   Verapamil Hives   Zetia [Ezetimibe] Other (See Comments)    Reaction not recalled   Zocor [Simvastatin - High Dose] Other (See Comments)    Reaction not recalled    Current Outpatient Medications  Medication Sig Dispense Refill   acetaminophen (TYLENOL) 325 MG tablet Take 2 tablets (650 mg total) by mouth every 6 (six) hours as needed for mild pain, fever or headache (for pain or headaches). (Patient taking differently: Take 500 mg by mouth every 6 (six) hours as needed for mild pain, fever or headache (for pain or headaches). )     ALPRAZolam (XANAX) 0.5 MG tablet Take 0.25 mg by mouth at bedtime.      amLODipine (NORVASC) 5 MG tablet Take 1  tablet (5 mg total) by mouth daily. 30 tablet 0   atorvastatin (LIPITOR) 80 MG tablet Take 1 tablet (80 mg total) by mouth daily at 6 PM.     Cholecalciferol (VITAMIN D3) 1000 UNITS CAPS Take 1,000 Units by mouth daily. 12 noon     COLCRYS 0.6 MG tablet Take 0.6 mg by mouth daily as needed (gout flare).      ferrous sulfate 325 (65 FE) MG tablet Take 1 tablet (325 mg total) by mouth 2 (two) times daily with a meal.     furosemide (LASIX) 40 MG tablet Take 80 mg by mouth daily.     hydrALAZINE (APRESOLINE) 100 MG tablet TAKE 1 TABLET (100 MG TOTAL) BY MOUTH 3 (THREE) TIMES DAILY. 270 tablet 1   irbesartan (AVAPRO) 300 MG tablet TAKE 1 TABLET DAILY (PLEASE  MAKE APPOINTMENT FOR REFILLS) (Patient taking differently: Take 300 mg by mouth daily. ) 90 tablet 3   levofloxacin (LEVAQUIN) 500 MG tablet Take 1 tablet (500 mg total) by mouth daily for 4 days. 4 tablet 0   magnesium oxide (MAG-OX) 400 (241.3 Mg) MG tablet Take 1 tablet (400 mg total) by mouth daily. (Patient taking differently: Take 400 mg by mouth every evening. )     metFORMIN (GLUCOPHAGE) 500 MG tablet Take 500 mg by mouth 2 (two) times daily with a meal.      metoprolol tartrate (LOPRESSOR) 25 MG tablet Take 0.5 tablets (12.5 mg total) by mouth 2 (two) times daily. 60 tablet 0   ondansetron (ZOFRAN) 4 MG tablet Take 4 mg by mouth every 8 (eight) hours as needed for nausea or vomiting.      pantoprazole (PROTONIX) 40 MG tablet Take 40 mg by mouth 2 (two) times daily.      spironolactone (ALDACTONE) 25 MG tablet Take 1 tablet (25 mg total) by mouth daily. 90 tablet 3   tadalafil (ADCIRCA/CIALIS) 20 MG tablet Take 1 tablet (20 mg total) by mouth 2 (two) times daily. 180 tablet 0   vitamin B-12 (CYANOCOBALAMIN) 100 MCG tablet Take 100 mcg by mouth daily at 12 noon.      warfarin (COUMADIN) 5 MG tablet Take 1-1.5 tablets (5-7.5 mg total) by mouth See admin instructions. Take 5 mg on Monday and Friday take in the morning Tale 7.5  mg all the other days     No current facility-administered medications for this visit.      OBJECTIVE: Older white woman examined in a wheelchair  Vitals:   03/07/19 1120  BP: (!) 151/29  Pulse: 65  Resp: 18  Temp: 98.5 F (36.9 C)  SpO2: 98%     Body mass index is 24.92 kg/m.   Wt Readings from Last 3 Encounters:  03/07/19 131 lb 14.4 oz (59.8 kg)  02/26/19 129 lb (58.5 kg)  12/11/18 131 lb 12.8 oz (59.8 kg)      ECOG FS:1 - Symptomatic but completely ambulatory  Ocular: Sclerae unicteric, EOMs intact Wearing a mask Lymphatic: No cervical or supraclavicular adenopathy Lungs no rales or rhonchi Heart regular rate and rhythm Abd soft, nontender, positive bowel sounds MSK grade 1 bilateral ankle edema without erythema Neuro: non-focal, well-oriented, appropriate affect Breasts: Deferred   LAB RESULTS:  CMP     Component Value Date/Time   NA 136 03/05/2019 0250   NA 137 12/06/2017 1436   K 3.7 03/05/2019 0250   CL 105 03/05/2019 0250   CO2 22 03/05/2019 0250   GLUCOSE 111 (H) 03/05/2019 0250   BUN 19 03/05/2019 0250   BUN 20 12/06/2017 1436   CREATININE 1.15 (H) 03/05/2019 0250   CALCIUM 8.2 (L) 03/05/2019 0250   PROT 6.2 (L) 02/25/2019 0312   ALBUMIN 3.0 (L) 02/25/2019 0312   AST 19 02/25/2019 0312   ALT 11 02/25/2019 0312   ALKPHOS 69 02/25/2019 0312   BILITOT 1.2 02/25/2019 0312   GFRNONAA 45 (L) 03/05/2019 0250   GFRAA 52 (L) 03/05/2019 0250    No results found for: TOTALPROTELP, ALBUMINELP, A1GS, A2GS, BETS, BETA2SER, GAMS, MSPIKE, SPEI  No results found for: KPAFRELGTCHN, LAMBDASER, KAPLAMBRATIO  Lab Results  Component Value Date   WBC 3.7 (L) 03/07/2019   NEUTROABS 3.0 03/07/2019   HGB 8.4 (L) 03/07/2019   HCT 25.7 (L) 03/07/2019   MCV 93.8 03/07/2019   PLT 174 03/07/2019    @  LASTCHEMISTRY@  No results found for: LABCA2  No components found for: [id number]  Recent Labs  Lab 03/05/19 0250  INR 2.1*    No results found for:  LABCA2  No results found for: QVZ563  No results found for: OVF643  No results found for: PIR518  No results found for: CA2729  No components found for: HGQUANT  No results found for: CEA1 / No results found for: CEA1   No results found for: AFPTUMOR  No results found for: CHROMOGRNA  No results found for: PSA1  Appointment on 03/07/2019  Component Date Value Ref Range Status   Smear Review 03/07/2019 SMEAR STAINED AND AVAILABLE FOR REVIEW   Final   Performed at [facility], [place] [address], [place], [place] [address]   Retic Ct Pct 03/07/2019 7.0* 0.4 - 3.1 % Final   RBC. 03/07/2019 2.74* 3.87 - 5.11 MIL/uL Final   Retic Count, Absolute 03/07/2019 191.8* 19.0 - 186.0 K/uL Final   Immature Retic Fract 03/07/2019 26.4* 2.3 - 15.9 % Final   Performed at [facility], [place] [address], [place], [place] [address]   WBC 03/07/2019 3.7* 4.0 - 10.5 K/uL Final   RBC 03/07/2019 2.74* 3.87 - 5.11 MIL/uL Final   Hemoglobin 03/07/2019 8.4* 12.0 - 15.0 g/dL Final   HCT 03/07/2019 25.7* 36.0 - 46.0 % Final   MCV 03/07/2019 93.8  80.0 - 100.0 fL Final   MCH 03/07/2019 30.7  26.0 - 34.0 pg Final   MCHC 03/07/2019 32.7  30.0 - 36.0 g/dL Final   RDW 03/07/2019 15.1  11.5 - 15.5 % Final   Platelets 03/07/2019 174  150 - 400 K/uL Final   nRBC 03/07/2019 0.0  0.0 - 0.2 % Final   Neutrophils Relative % 03/07/2019 79  % Final   Neutro Abs 03/07/2019 3.0  1.7 - 7.7 K/uL Final   Lymphocytes Relative 03/07/2019 12  % Final   Lymphs Abs 03/07/2019 0.4* 0.7 - 4.0 K/uL Final   Monocytes Relative 03/07/2019 5  % Final   Monocytes Absolute 03/07/2019 0.2  0.1 - 1.0 K/uL Final   Eosinophils Relative 03/07/2019 2  % Final   Eosinophils Absolute 03/07/2019 0.1  0.0 - 0.5 K/uL Final   Basophils Relative 03/07/2019 1  % Final   Basophils Absolute 03/07/2019 0.0  0.0 - 0.1 K/uL Final   Immature Granulocytes 03/07/2019 1  % Final    Abs Immature Granulocytes 03/07/2019 0.04  0.00 - 0.07 K/uL Final   Performed at [facility], [place] [address], [place], [place] [address]  No results displayed because visit has over 200 results.      (this displays the last labs from the last 3 days)  No results found for: TOTALPROTELP, ALBUMINELP, A1GS, A2GS, BETS, BETA2SER, GAMS, MSPIKE, SPEI (this displays SPEP labs)  No results found for: KPAFRELGTCHN, LAMBDASER, KAPLAMBRATIO (kappa/lambda light chains)  No results found for: HGBA, HGBA2QUANT, HGBFQUANT, HGBSQUAN (Hemoglobinopathy evaluation)   Lab Results  Component Value Date   LDH 197 (H) 02/27/2019    Lab Results  Component Value Date   IRON 56 02/13/2019   TIBC 389 02/13/2019   IRONPCTSAT 14 02/13/2019   (Iron and TIBC)  Lab Results  Component Value Date   FERRITIN 61 02/13/2019    Urinalysis    Component Value Date/Time   COLORURINE YELLOW 02/28/2019 0855   APPEARANCEUR HAZY (A) 02/28/2019 0855   LABSPEC 1.011 02/28/2019 0855   PHURINE 6.0 02/28/2019  [place] 02/28/2019 0855   HGBUR MODERATE (A) 02/28/2019 0855   BILIRUBINUR NEGATIVE 02/28/2019 [place] 02/28/2019 0855   PROTEINUR 100 (A) 02/28/2019 0855   UROBILINOGEN 1.0 10/06/2011 1709   NITRITE NEGATIVE 02/28/2019 0855   LEUKOCYTESUR MODERATE (A) 02/28/2019 0855     STUDIES:  Dg Chest 2 View  Result Date: 02/28/2019 CLINICAL DATA:  Fever. EXAM: CHEST - 2 VIEW COMPARISON:  Chest radiograph 11/20/2018 and CT 12/12/2018 FINDINGS: Sequelae of CABG and aortic valve replacement are again identified. The cardiac silhouette is mildly enlarged. There is increased ground-glass opacity in the right lung base. Subtly increased hazy opacity is also present in the right greater than left mid lungs. No pleural effusion or pneumothorax is identified. Calcified granulomas are noted in both lungs. No acute osseous abnormality is seen. IMPRESSION: Hazy  lung opacities most notable in the right lung base which are suspicious for pneumonia in this patient with fever (including viral/atypical etiologies). Electronically Signed   By: Logan Bores M.D.   On: 02/28/2019 10:00     ELIGIBLE FOR AVAILABLE RESEARCH PROTOCOL: no   ASSESSMENT: 78 y.o. [place] woman with multifactorial anemia, as follows:             (a) anemia of chronic inflammation: As of May 2020 she has a SED rate of 95, CRP 1.1, positive ANA and RF; she has been referred to rheumatology with an appointment pending 03/27/2019 inflammation makes iron stores relatively inaccessible for hematopoiesis and blunts the effect of native EPO             (b) hemolysis (mild): she has a mildly elevated LDH and a DAT positive for complement, not IgG; this is c/w (a) above             (c) anemia of renal insufficiency: inadequate EPO resonse to anemia            (d) occult  (d) GIB/ iron deficiency in patient on lifelong anticoagulaton  (1) Feraheme received 03/01/2019, to be repeated 03/20/2019   (a) reticulocyte 03/07/2019 up from 43.4 a year ago to 191.8 after iron infusion  (2) darbepoetin: Consider starting 03/20/2019 and repeat every 28 days; however given her excellent reticulocyte response to iron replacement without EPO, we will likely hold on darbepoetin for now    PLAN: Deva tolerated her first Feraheme infusion, received in the hospital, with no complications.  She is having a very nice response, with a reticulocyte count in the near 200 range.  Her hemoglobin is rising accordingly.  This is very favorable.  Of course she is going to continue to lose iron as she has in the past.  She will receive a second Feraheme infusion 03/20/2019 and after that she will see me again late August.  At that time she may or may not need further iron supplementation.  We will see her thereafter every 3 months just today I had of the iron deficiency issue and whenever her ferritin drops below 100  she will receive Feraheme  I explained to her son that in addition to iron deficiency she does have some renal insufficiency which may require erythropoietin supplementation.  At this point however I am going to hold off on that intervention  Finally she also has an inflammatory condition, with a positive rheumatoid factor and ANA, to be further evaluated by Dr. Lenna Gilford this coming month.  That can also be a separate cause for anemia, and likely treatment of  that problem will also help the blood issues  At this point I am delighted that [name] is doing so well.  She will see me again in August.  She knows to call for any other issues that may develop before that.   Kadyn Chovan, [name] Dad, MD  03/07/19 12:00 PM Medical Oncology and Hematology [facility] [address] [place], [place] [address] Tel. [phone]    Fax. [phone]   I, Jacqualyn Posey am acting as a Education administrator for Chauncey Cruel, MD.   I, Lurline Del MD, have reviewed the above documentation for accuracy and completeness, and I agree with the above.

## 2019-03-06 NOTE — Telephone Encounter (Signed)
Scheduled appts per sch msg. Called patient. No answer

## 2019-03-07 ENCOUNTER — Inpatient Hospital Stay: Payer: Medicare HMO | Attending: Oncology | Admitting: Oncology

## 2019-03-07 ENCOUNTER — Ambulatory Visit: Payer: Self-pay

## 2019-03-07 ENCOUNTER — Inpatient Hospital Stay: Payer: Medicare HMO

## 2019-03-07 ENCOUNTER — Other Ambulatory Visit: Payer: Self-pay

## 2019-03-07 ENCOUNTER — Other Ambulatory Visit: Payer: Self-pay | Admitting: *Deleted

## 2019-03-07 VITALS — BP 151/29 | HR 65 | Temp 98.5°F | Resp 18 | Ht 61.0 in | Wt 131.9 lb

## 2019-03-07 DIAGNOSIS — D696 Thrombocytopenia, unspecified: Secondary | ICD-10-CM

## 2019-03-07 DIAGNOSIS — Z7901 Long term (current) use of anticoagulants: Secondary | ICD-10-CM | POA: Insufficient documentation

## 2019-03-07 DIAGNOSIS — Z79899 Other long term (current) drug therapy: Secondary | ICD-10-CM | POA: Insufficient documentation

## 2019-03-07 DIAGNOSIS — Z7984 Long term (current) use of oral hypoglycemic drugs: Secondary | ICD-10-CM | POA: Insufficient documentation

## 2019-03-07 DIAGNOSIS — E1151 Type 2 diabetes mellitus with diabetic peripheral angiopathy without gangrene: Secondary | ICD-10-CM | POA: Diagnosis not present

## 2019-03-07 DIAGNOSIS — N183 Chronic kidney disease, stage 3 unspecified: Secondary | ICD-10-CM

## 2019-03-07 DIAGNOSIS — K259 Gastric ulcer, unspecified as acute or chronic, without hemorrhage or perforation: Secondary | ICD-10-CM | POA: Diagnosis not present

## 2019-03-07 DIAGNOSIS — Z803 Family history of malignant neoplasm of breast: Secondary | ICD-10-CM | POA: Insufficient documentation

## 2019-03-07 DIAGNOSIS — E119 Type 2 diabetes mellitus without complications: Secondary | ICD-10-CM | POA: Insufficient documentation

## 2019-03-07 DIAGNOSIS — Z87891 Personal history of nicotine dependence: Secondary | ICD-10-CM | POA: Insufficient documentation

## 2019-03-07 DIAGNOSIS — I10 Essential (primary) hypertension: Secondary | ICD-10-CM

## 2019-03-07 DIAGNOSIS — D649 Anemia, unspecified: Secondary | ICD-10-CM | POA: Diagnosis not present

## 2019-03-07 DIAGNOSIS — D508 Other iron deficiency anemias: Secondary | ICD-10-CM

## 2019-03-07 DIAGNOSIS — I739 Peripheral vascular disease, unspecified: Secondary | ICD-10-CM

## 2019-03-07 DIAGNOSIS — D594 Other nonautoimmune hemolytic anemias: Secondary | ICD-10-CM

## 2019-03-07 DIAGNOSIS — I272 Pulmonary hypertension, unspecified: Secondary | ICD-10-CM | POA: Diagnosis not present

## 2019-03-07 DIAGNOSIS — I4891 Unspecified atrial fibrillation: Secondary | ICD-10-CM | POA: Insufficient documentation

## 2019-03-07 DIAGNOSIS — D500 Iron deficiency anemia secondary to blood loss (chronic): Secondary | ICD-10-CM | POA: Diagnosis not present

## 2019-03-07 DIAGNOSIS — N181 Chronic kidney disease, stage 1: Secondary | ICD-10-CM

## 2019-03-07 DIAGNOSIS — R112 Nausea with vomiting, unspecified: Secondary | ICD-10-CM | POA: Diagnosis not present

## 2019-03-07 DIAGNOSIS — E1122 Type 2 diabetes mellitus with diabetic chronic kidney disease: Secondary | ICD-10-CM

## 2019-03-07 LAB — CBC WITH DIFFERENTIAL/PLATELET
Abs Immature Granulocytes: 0.04 10*3/uL (ref 0.00–0.07)
Basophils Absolute: 0 10*3/uL (ref 0.0–0.1)
Basophils Relative: 1 %
Eosinophils Absolute: 0.1 10*3/uL (ref 0.0–0.5)
Eosinophils Relative: 2 %
HCT: 25.7 % — ABNORMAL LOW (ref 36.0–46.0)
Hemoglobin: 8.4 g/dL — ABNORMAL LOW (ref 12.0–15.0)
Immature Granulocytes: 1 %
Lymphocytes Relative: 12 %
Lymphs Abs: 0.4 10*3/uL — ABNORMAL LOW (ref 0.7–4.0)
MCH: 30.7 pg (ref 26.0–34.0)
MCHC: 32.7 g/dL (ref 30.0–36.0)
MCV: 93.8 fL (ref 80.0–100.0)
Monocytes Absolute: 0.2 10*3/uL (ref 0.1–1.0)
Monocytes Relative: 5 %
Neutro Abs: 3 10*3/uL (ref 1.7–7.7)
Neutrophils Relative %: 79 %
Platelets: 174 10*3/uL (ref 150–400)
RBC: 2.74 MIL/uL — ABNORMAL LOW (ref 3.87–5.11)
RDW: 15.1 % (ref 11.5–15.5)
WBC: 3.7 10*3/uL — ABNORMAL LOW (ref 4.0–10.5)
nRBC: 0 % (ref 0.0–0.2)

## 2019-03-07 LAB — RETICULOCYTES
Immature Retic Fract: 26.4 % — ABNORMAL HIGH (ref 2.3–15.9)
RBC.: 2.74 MIL/uL — ABNORMAL LOW (ref 3.87–5.11)
Retic Count, Absolute: 191.8 10*3/uL — ABNORMAL HIGH (ref 19.0–186.0)
Retic Ct Pct: 7 % — ABNORMAL HIGH (ref 0.4–3.1)

## 2019-03-07 LAB — FERRITIN: Ferritin: 652 ng/mL — ABNORMAL HIGH (ref 11–307)

## 2019-03-07 LAB — SAVE SMEAR(SSMR), FOR PROVIDER SLIDE REVIEW

## 2019-03-07 NOTE — Patient Outreach (Signed)
[facility]) Care Management  03/07/2019  [name] 11/20/1940 [id number]   Referral received from hospital liaison as member was recently admitted to hospital for symptomatic anemia 5/18-5/27.  Also diagnosed with pneumonia during hospitalization.  Primary MD office will complete transition of care assessment.  Per chart, she also has history of hypertension, CAD, A-fib, CHF, GERD, controlled diabetes, and CKD.  Call placed to member to follow up on discharge, identity verified.  This care manager introduced self and stated purpose of call.  [facility] care management services explained.  She is aware of services as she has been active with program in the past.  Report she is progressing, denies any dizziness or bleeding.  Was seen by hematologist today, will have another iron infusion on 6/11.  Also report she will have virtual visit with primary MD later today.    She lives alone but has the support of her adult children.  Son Thurmond Butts provide transportation to MD appointments and support with medication and medical management when needed.  Denies need for social worker or pharmacy services.  Denies any urgent concerns at this time.  Provided with this care manager's contact information, advised to contact with questions.  Will follow up within the next week to complete initial assessment.  [facility] CM Care Plan Problem One     Most Recent Value  Care Plan Problem One  Risk for hospitalization related to anemia as evidenced by recent hospital admission  Role Documenting the Problem One  Care Management [place] for Problem One  Active  [place] Term Goal   Member will not be readmitted to hospital within the next 31 days  [facility] Long Term Goal Start Date  03/07/19  Interventions for Problem One Long Term Goal  Discharge instructions reviewed with member.  Advised of importance of following plan of care in effort to decrease risk of readmission.  [facility] CM Short Term Goal #1    Member will report taking medications as prescribed over the next 4 weeks  [facility] CM Short Term Goal #1 Start Date  03/07/19  Interventions for Short Term Goal #1  Reviewed medications with member.  Inquired about changes, advised to discuss potential changes with MD.  [facility] CM Short Term Goal #2   Member will report follow up with PCP within the next week  [facility] CM Short Term Goal #2 Start Date  03/07/19  Interventions for Short Term Goal #2  Upcoming appointment with PCP discussed, face to face visit will depend on telephone assessment      [name], RN, MSN [place] Manager [phone]

## 2019-03-09 ENCOUNTER — Emergency Department (HOSPITAL_COMMUNITY)
Admission: EM | Admit: 2019-03-09 | Discharge: 2019-03-09 | Disposition: A | Discharge status: Home / Self Care | Payer: Medicare HMO | Attending: Emergency Medicine | Admitting: Emergency Medicine

## 2019-03-09 ENCOUNTER — Other Ambulatory Visit: Payer: Self-pay

## 2019-03-09 ENCOUNTER — Encounter (HOSPITAL_COMMUNITY): Payer: Self-pay | Admitting: Emergency Medicine

## 2019-03-09 ENCOUNTER — Emergency Department (HOSPITAL_COMMUNITY): Payer: Medicare HMO

## 2019-03-09 DIAGNOSIS — Z7984 Long term (current) use of oral hypoglycemic drugs: Secondary | ICD-10-CM | POA: Insufficient documentation

## 2019-03-09 DIAGNOSIS — N183 Chronic kidney disease, stage 3 (moderate): Secondary | ICD-10-CM | POA: Diagnosis not present

## 2019-03-09 DIAGNOSIS — R112 Nausea with vomiting, unspecified: Secondary | ICD-10-CM | POA: Insufficient documentation

## 2019-03-09 DIAGNOSIS — Z79899 Other long term (current) drug therapy: Secondary | ICD-10-CM | POA: Insufficient documentation

## 2019-03-09 DIAGNOSIS — I2510 Atherosclerotic heart disease of native coronary artery without angina pectoris: Secondary | ICD-10-CM | POA: Diagnosis not present

## 2019-03-09 DIAGNOSIS — Z87891 Personal history of nicotine dependence: Secondary | ICD-10-CM | POA: Diagnosis not present

## 2019-03-09 DIAGNOSIS — Z7901 Long term (current) use of anticoagulants: Secondary | ICD-10-CM | POA: Insufficient documentation

## 2019-03-09 DIAGNOSIS — Z952 Presence of prosthetic heart valve: Secondary | ICD-10-CM | POA: Diagnosis not present

## 2019-03-09 DIAGNOSIS — I129 Hypertensive chronic kidney disease with stage 1 through stage 4 chronic kidney disease, or unspecified chronic kidney disease: Secondary | ICD-10-CM | POA: Insufficient documentation

## 2019-03-09 DIAGNOSIS — E1122 Type 2 diabetes mellitus with diabetic chronic kidney disease: Secondary | ICD-10-CM | POA: Diagnosis not present

## 2019-03-09 LAB — CBC
HCT: 25.2 % — ABNORMAL LOW (ref 36.0–46.0)
Hemoglobin: 8.2 g/dL — ABNORMAL LOW (ref 12.0–15.0)
MCH: 30.9 pg (ref 26.0–34.0)
MCHC: 32.5 g/dL (ref 30.0–36.0)
MCV: 95.1 fL (ref 80.0–100.0)
Platelets: 181 10*3/uL (ref 150–400)
RBC: 2.65 MIL/uL — ABNORMAL LOW (ref 3.87–5.11)
RDW: 16.2 % — ABNORMAL HIGH (ref 11.5–15.5)
WBC: 3.7 10*3/uL — ABNORMAL LOW (ref 4.0–10.5)
nRBC: 0 % (ref 0.0–0.2)

## 2019-03-09 LAB — COMPREHENSIVE METABOLIC PANEL
ALT: 18 U/L (ref 0–44)
AST: 24 U/L (ref 15–41)
Albumin: 2.7 g/dL — ABNORMAL LOW (ref 3.5–5.0)
Alkaline Phosphatase: 67 U/L (ref 38–126)
Anion gap: 10 (ref 5–15)
BUN: 25 mg/dL — ABNORMAL HIGH (ref 8–23)
CO2: 22 mmol/L (ref 22–32)
Calcium: 9.1 mg/dL (ref 8.9–10.3)
Chloride: 100 mmol/L (ref 98–111)
Creatinine, Ser: 1.31 mg/dL — ABNORMAL HIGH (ref 0.44–1.00)
GFR calc Af Amer: 44 mL/min — ABNORMAL LOW (ref >60)
GFR calc non Af Amer: 38 mL/min — ABNORMAL LOW (ref >60)
Glucose, Bld: 88 mg/dL (ref 70–99)
Potassium: 3.8 mmol/L (ref 3.5–5.1)
Sodium: 132 mmol/L — ABNORMAL LOW (ref 135–145)
Total Bilirubin: 1.7 mg/dL — ABNORMAL HIGH (ref 0.3–1.2)
Total Protein: 5.8 g/dL — ABNORMAL LOW (ref 6.5–8.1)

## 2019-03-09 LAB — URINALYSIS, ROUTINE W REFLEX MICROSCOPIC
Bacteria, UA: NONE SEEN
Bilirubin Urine: NEGATIVE
Glucose, UA: NEGATIVE mg/dL
Ketones, ur: NEGATIVE mg/dL
Leukocytes,Ua: NEGATIVE
Nitrite: NEGATIVE
Protein, ur: 30 mg/dL — AB
Specific Gravity, Urine: 1.006 (ref 1.005–1.030)
pH: 7 (ref 5.0–8.0)

## 2019-03-09 LAB — LIPASE, BLOOD: Lipase: 25 U/L (ref 11–51)

## 2019-03-09 MED ORDER — ONDANSETRON 4 MG PO TBDP: 4.0000 mg | ORAL_TABLET | Freq: Three times a day (TID) | ORAL | 0 refills | Status: DC | PRN

## 2019-03-09 MED ORDER — SODIUM CHLORIDE 0.9 % IV BOLUS
1000.0000 mL | Freq: Once | INTRAVENOUS | Status: AC
  Administered 2019-03-09: 19:00:00 1000 mL via INTRAVENOUS

## 2019-03-09 MED ORDER — ONDANSETRON HCL 4 MG/2ML IJ SOLN
4.0000 mg | Freq: Once | INTRAMUSCULAR | Status: AC
  Administered 2019-03-09: 19:00:00 4 mg via INTRAVENOUS
  Filled 2019-03-09: qty 2

## 2019-03-09 NOTE — ED Provider Notes (Signed)
Care assumed from Dr. Venora Maples.  Please see his full H&P.  In short,  [name] is a 78 y.o. female presents for nausea and vomiting.  X-ray of abdomen shows no obstruction or free air.  Globin today is 8.2, this is similar to most recent value.  UA without signs of infection.  Patient is tolerating p.o. intake while in the emergency department.  Patient is hemodynamically stable, in NAD. Evaluation does not show pathology that would require ongoing emergent intervention or inpatient treatment. I explained the diagnosis to the patient. Patient is comfortable with above plan and is stable for discharge at this time. All questions were answered prior to disposition. Strict return precautions for returning to the ED were discussed. Encouraged follow up with GI and PCP.    Flint Melter 03/09/19 2210    Jola Schmidt, MD 03/13/19 [phone]

## 2019-03-09 NOTE — Discharge Instructions (Signed)
Return to the emergency room for worsening condition or new concerning symptoms.    Please follow-up with your GI doctor.  We recommend you call tomorrow to schedule an appointment to be seen within the week.  Prescription has been sent to your pharmacy for Zofran, this is a medication for nausea.  Please take only when needed and as prescribed.  If you have worsening nausea and are unable to tolerate food or liquids please return to the emergency room for further evaluation

## 2019-03-09 NOTE — ED Notes (Signed)
All appropriate discharge materials reviewed with patient at length. Time for questions provided. Pt denies any further questions at this time. Verbalizes understanding of all provided materials.  

## 2019-03-09 NOTE — ED Triage Notes (Signed)
Pt reports recent hospitalization for bloody nose and low hemoglobin. Pt reports she has been nauseated with vomiting since coming out of the hospital 4 days ago. Pt denies any any blood in her emesis.

## 2019-03-09 NOTE — ED Notes (Signed)
To x-ray

## 2019-03-09 NOTE — ED Provider Notes (Signed)
[facility] EMERGENCY DEPARTMENT Provider Note   CSN: [id number] Arrival date & time: 03/09/19  1644    History   Chief Complaint Chief Complaint  Patient presents with  . Nausea  . Emesis  . Epistaxis    HPI [name] is a 78 y.o. female.     HPI 78 year old female who was recently discharged from the hospital approximately 5 days ago after an admission for GI bleeding thought to be secondary to gastritis and requiring blood transfusion.  She states she was discharged home and since discharge she has had nausea vomiting and has felt generally poor.  She denies diarrhea.  She reports 2 small bowel movements today that were nonbloody and not dark in color.  She has had no hematemesis.  She reports no epistaxis today.  She states decreased oral intake over the past 24 to 48 hours as everything she tries to eat comes back up.  No history of bowel obstruction.  Denies focal abdominal pain.  No cough or shortness of breath.  No fevers or chills.  Symptoms are moderate in severity   Past Medical History:  Diagnosis Date  . Aortic atherosclerosis ([place]) 01/23/2018  . Atrial fibrillation ([place])   . Carotid artery disease ([place])   . Cholelithiasis 01/23/2018  . Chronic anticoagulation   . Coronary artery disease    status post coronary artery bypass grafting times 07/10/2004  . Diabetes mellitus   . Hypercholesteremia   . Hypertension   . Mechanical heart valve present    H. aortic valve replacement at the time of bypass surgery October 2005  . Moderate to severe pulmonary hypertension ([place])   . Peripheral arterial disease (HCC)    history of left common iliac artery PTA and stenting for a chronic total occlusion 08/26/01    Patient Active Problem List   Diagnosis Date Noted  . Iron deficiency anemia 02/28/2019  . 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 ([place]) 02/28/2019  . CKD (chronic kidney disease) stage 3, GFR 30-59 ml/min (HCC)  02/24/2019  . Malnutrition of moderate degree 03/12/2018  . Acute on chronic diastolic heart failure ([place]) 03/06/2018  . Diabetes mellitus type 2, controlled ([place]) 03/06/2018  . Chronic atrial fibrillation 03/06/2018  . S/P cholecystectomy 03/06/2018  . HTN (hypertension) 03/06/2018  . H/O mechanical aortic valve replacement 03/06/2018  . Acute hyponatremia 03/06/2018  . Acute respiratory failure with hypoxia ([place]) 03/06/2018  . Moderate to severe pulmonary hypertension ([place]) 03/06/2018  . UTI (urinary tract infection) 02/22/2018  . Cholecystitis 02/21/2018  . GERD (gastroesophageal reflux disease) 02/21/2018  . Anxiety 02/21/2018  . Chronic diastolic CHF (congestive heart failure) ([place]) 02/21/2018  . Diarrhea 02/21/2018  . Rash 02/21/2018  . General weakness 02/02/2018  . Macular rash 01/25/2018  . Thrombocytopenia ([place]) 01/25/2018  . Arm pain, diffuse 01/25/2018  . Anemia associated with stage 3 chronic renal failure ([place]) 01/23/2018  . Cholelithiasis 01/23/2018  . Aortic atherosclerosis ([place]) 01/23/2018  . GI bleed 01/18/2018  . Atrial fibrillation ([place]) 01/12/2016  . Coronary artery disease 08/18/2013  . Carotid artery disease ([place]) 08/18/2013  . Peripheral arterial disease ([place]) 08/18/2013  . Diabetes mellitus without complication ([place]) 95/18/8416  . Benign positional vertigo 10/06/2011  . Hyponatremia 10/06/2011  . Influenza 10/06/2011  . Sinusitis 10/06/2011  . Hypertension   . Hypercholesteremia   . Mechanical heart valve present   . Chronic anticoagulation     Past Surgical History:  Procedure Laterality Date  . AORTIC VALVE  REPLACEMENT    . CARDIAC CATHETERIZATION  11/10/2004   40% right common illiac, 70% in stent restenosis of distal left common illiac,   . CARDIAC CATHETERIZATION  05/18/2004   LAD 50-70% midstenosis, RCA dominant w/50% stenosis, 50% Right common Illiac artery ostial stenosis, 90% in stent restenosis within midportion of left common illiac  stent  . Carotid Duplex  03/12/2012   RSA-elev. velocities suggestive of a 50-69% diameter reduction, Right&Left Bulb/Prox ICA-mild-mod.fibrous plaqueelevating Velocities abnormal study.  . CHOLECYSTECTOMY N/A 03/01/2018   Procedure: LAPAROSCOPIC CHOLECYSTECTOMY;  Surgeon: Judeth Horn, MD;  Location: [facility];  Service: General;  Laterality: N/A;  . COLONOSCOPY WITH PROPOFOL N/A 01/22/2018   Procedure: COLONOSCOPY WITH PROPOFOL;  Surgeon: Wilford Corner, MD;  Location: [facility];  Service: Endoscopy;  Laterality: N/A;  . ESOPHAGOGASTRODUODENOSCOPY (EGD) WITH PROPOFOL N/A 01/22/2018   Procedure: ESOPHAGOGASTRODUODENOSCOPY (EGD) WITH PROPOFOL;  Surgeon: Wilford Corner, MD;  Location: [facility];  Service: Endoscopy;  Laterality: N/A;  . ESOPHAGOGASTRODUODENOSCOPY (EGD) WITH PROPOFOL N/A 11/21/2018   Procedure: ESOPHAGOGASTRODUODENOSCOPY (EGD) WITH PROPOFOL;  Surgeon: [name], MD;  Location: [facility];  Service: Gastroenterology;  Laterality: N/A;  . ESOPHAGOGASTRODUODENOSCOPY (EGD) WITH PROPOFOL Left 02/27/2019   Procedure: ESOPHAGOGASTRODUODENOSCOPY (EGD) WITH PROPOFOL;  Surgeon: Arta Silence, MD;  [facility] ENDOSCOPY;  Service: Endoscopy;  Laterality: Left;  . GIVENS CAPSULE STUDY N/A 11/21/2018   Procedure: GIVENS CAPSULE STUDY;  Surgeon: [name], MD;  Location: [facility];  Service: Gastroenterology;  Laterality: N/A;  To be deployed during EGD  . Lower Ext. Duplex  03/12/2012   Right Proximal CIA- vessel narrowing w/elevated velocities 0-49% diameter reduction. Right SFA-mild mixed density plaque throughout vessel.  [name] NM MYOCAR PERF WALL MOTION  05/19/2010   protocol: Persantine, post stress EF 65%, negative for ischemia, low risk scan  . RIGHT HEART CATH N/A 06/27/2018   Procedure: RIGHT HEART CATH;  Surgeon: [name], MD;  Location: [facility];  Service: Cardiovascular;  Laterality: N/A;  . TRANSTHORACIC ECHOCARDIOGRAM  08/29/2012   Moderately calcified  annulus of mitral valve, moderate regurg. of both mitral valve and tricuspid valve.      OB History   No obstetric history on file.      Home Medications    Prior to Admission medications   Medication Sig Start Date End Date Taking? Authorizing Provider  acetaminophen (TYLENOL) 325 MG tablet Take 2 tablets (650 mg total) by mouth every 6 (six) hours as needed for mild pain, fever or headache (for pain or headaches). Patient taking differently: Take 500 mg by mouth every 6 (six) hours as needed for mild pain, fever or headache (for pain or headaches).  03/03/18   [place], Floyce Stakes, PA-C  ALPRAZolam Duanne Moron) 0.5 MG tablet Take 0.25 mg by mouth at bedtime.     [provider]  atorvastatin (LIPITOR) 80 MG tablet Take 1 tablet (80 mg total) by mouth daily at 6 PM. 11/22/18   [name], MD  Cholecalciferol (VITAMIN D3) 1000 UNITS CAPS Take 1,000 Units by mouth daily. 12 noon    [provider]  COLCRYS 0.6 MG tablet Take 0.6 mg by mouth daily as needed (gout flare).  03/22/18   [provider]  ferrous sulfate 325 (65 FE) MG tablet Take 1 tablet (325 mg total) by mouth 2 (two) times daily with a meal. 11/22/18   [name], MD  furosemide (LASIX) 40 MG tablet Take 80 mg by mouth daily.    [provider]  hydrALAZINE (  APRESOLINE) 100 MG tablet TAKE 1 TABLET (100 MG TOTAL) BY MOUTH 3 (THREE) TIMES DAILY. 01/20/19   [name], MD  irbesartan (AVAPRO) 300 MG tablet TAKE 1 TABLET DAILY (PLEASE MAKE APPOINTMENT FOR REFILLS) Patient taking differently: Take 300 mg by mouth daily.  08/06/18   Lorretta Harp, MD  levofloxacin (LEVAQUIN) 500 MG tablet Take 1 tablet (500 mg total) by mouth daily for 4 days. 03/06/19 03/10/19  [place] Donnamarie Poag, DO  magnesium oxide (MAG-OX) 400 (241.3 Mg) MG tablet Take 1 tablet (400 mg total) by mouth daily. Patient taking differently: Take 400 mg by mouth every evening.  01/30/18   [name], MD  metFORMIN  (GLUCOPHAGE) 500 MG tablet Take 500 mg by mouth 2 (two) times daily with a meal.     [provider]  metoprolol tartrate (LOPRESSOR) 25 MG tablet Take 0.5 tablets (12.5 mg total) by mouth 2 (two) times daily. 03/05/19   [place] Eric J, DO  ondansetron (ZOFRAN) 4 MG tablet Take 4 mg by mouth every 8 (eight) hours as needed for nausea or vomiting.  03/22/18   [provider]  pantoprazole (PROTONIX) 40 MG tablet Take 40 mg by mouth 2 (two) times daily.  12/05/14   [provider]  spironolactone (ALDACTONE) 25 MG tablet Take 1 tablet (25 mg total) by mouth daily. 02/03/19 05/04/19  [name], MD  tadalafil (ADCIRCA/CIALIS) 20 MG tablet Take 1 tablet (20 mg total) by mouth 2 (two) times daily. 01/02/19   [name], MD  vitamin B-12 (CYANOCOBALAMIN) 100 MCG tablet Take 100 mcg by mouth daily at 12 noon.     [provider]  warfarin (COUMADIN) 5 MG tablet Take 1-1.5 tablets (5-7.5 mg total) by mouth See admin instructions. Take 5 mg on Monday and Friday take in the morning Tale 7.5 mg all the other days 11/22/18   [name], MD    Family History Family History  Problem Relation Age of Onset  . Breast cancer Neg Hx     Social History Social History   Tobacco Use  . Smoking status: Former Smoker    Packs/day: 1.00    Years: 30.00    Pack years: 30.00    Types: Cigarettes  . Smokeless tobacco: Never Used  Substance Use Topics  . Alcohol use: No    Alcohol/week: 0.0 standard drinks  . Drug use: No     Allergies   Flagyl [metronidazole]; Coreg [carvedilol]; Losartan; Verapamil; Zetia [ezetimibe]; and Zocor [simvastatin - high dose]   Review of Systems Review of Systems  All other systems reviewed and are negative.    Physical Exam Updated Vital Signs BP (!) 138/42 (BP Location: Right Arm)   Pulse 68   Temp 98.1 F (36.7 C) (Oral)   Resp 18   Ht 5\' 1"  (1.549 m)   Wt 59.8 kg   SpO2 95%   BMI 24.92 kg/m   Physical Exam  Vitals signs and nursing note reviewed.  Constitutional:      General: She is not in acute distress.    Appearance: She is well-developed.  HENT:     Head: Normocephalic and atraumatic.  Neck:     Musculoskeletal: Normal range of motion.  Cardiovascular:     Rate and Rhythm: Normal rate and regular rhythm.     Heart sounds: Normal heart sounds.  Pulmonary:     Effort: Pulmonary effort is normal.     Breath sounds: Normal breath sounds.  Abdominal:     General: There is no distension.     Palpations: Abdomen is soft.     Tenderness: There is no abdominal tenderness.  Musculoskeletal: Normal range of motion.  Skin:    General: Skin is warm and dry.  Neurological:     Mental Status: She is alert and oriented to person, place, and time.  Psychiatric:        Judgment: Judgment normal.      ED Treatments / Results  Labs (all labs ordered are listed, but only abnormal results are displayed) Labs Reviewed  LIPASE, BLOOD  COMPREHENSIVE METABOLIC PANEL  CBC  URINALYSIS, ROUTINE W REFLEX MICROSCOPIC  TYPE AND SCREEN    EKG None  Radiology No results found.  Procedures Procedures (including critical care time)  Medications Ordered in ED Medications  ondansetron (ZOFRAN) injection 4 mg (4 mg Intravenous Given 03/09/19 1833)  sodium chloride 0.9 % bolus 1,000 mL (1,000 mLs Intravenous New Bag/Given 03/09/19 1830)     Initial Impression / Assessment and Plan / ED Course  I have reviewed the triage vital signs and the nursing notes.  Pertinent labs & imaging results that were available during my care of the patient were reviewed by me and considered in my medical decision making (see chart for details).        Plain film as well as repeat blood work to be obtained at this time.  IV Zofran and IV hydration at this time for symptomatic management.  No focal abdominal tenderness to suggest need for CT imaging.  Final Clinical Impressions(s) / ED Diagnoses   Final  diagnoses:  Nausea & vomiting    ED Discharge Orders    None       Jola Schmidt, MD 03/13/19 [phone]

## 2019-03-09 NOTE — ED Notes (Signed)
Daughter, Helene [phone] for updates

## 2019-03-10 ENCOUNTER — Other Ambulatory Visit (HOSPITAL_COMMUNITY): Payer: Medicare HMO

## 2019-03-11 LAB — TYPE AND SCREEN
ABO/RH(D): A POS
Antibody Screen: NEGATIVE
Unit division: 0
Unit division: 0

## 2019-03-11 LAB — BPAM RBC
Blood Product Expiration Date: 202006182359
Blood Product Expiration Date: 202006182359
Unit Type and Rh: 6200
Unit Type and Rh: 6200

## 2019-03-13 ENCOUNTER — Other Ambulatory Visit: Payer: Self-pay | Admitting: *Deleted

## 2019-03-13 NOTE — Patient Outreach (Signed)
[facility]) Care Management  03/13/2019  [name] [birth year] [phone]   Call placed to member to complete initial assessment, no answer.  Unable to leave a voice message, will follow up within the next 3-4 business days.  [name], [place], MSN [place] [phone]

## 2019-03-14 ENCOUNTER — Ambulatory Visit: Payer: PRIVATE HEALTH INSURANCE | Admitting: *Deleted

## 2019-03-16 ENCOUNTER — Other Ambulatory Visit: Payer: Self-pay | Admitting: Cardiovascular Disease

## 2019-03-18 NOTE — Telephone Encounter (Signed)
Made f/u call to patient re June/August appointments. Left message re 6/11 and mailed schedule.

## 2019-03-19 ENCOUNTER — Other Ambulatory Visit: Payer: Self-pay | Admitting: *Deleted

## 2019-03-19 NOTE — Progress Notes (Signed)
[place]  Telephone:([phone]) [phone] [phone]) [phone]    ID: [name] DOB: 1941/05/10  MR#: [id number]  TIR#:[id number]  Patient Care Team: [name], MD as PCP - General (Internal Medicine) Lorretta Harp, MD as PCP - Cardiology (Cardiology) [name], RN as [place] Management Kalan Yeley, [name], MD as Consulting Physician (Hematology and Oncology) Arta Silence, MD as Consulting Physician (Gastroenterology) [name], MD as Consulting Physician (Cardiology) Marzetta Board, DPM as Consulting Physician (Podiatry) Gavin Pound, MD as Consulting Physician (Rheumatology) OTHER MD:   CHIEF COMPLAINT: anemia and thrombocytopenia  CURRENT TREATMENT: Feraheme, [aranesp]   INTERVAL HISTORY: [name] was seen today for follow-up and treatment of her anemia and thrombocytopenia.    She is scheduled to receive her second Feraheme iron infusion today.  She received her first infusion on 03/01/2019, with no side effects that she is aware of.  Her appointment with rheumatology unfortunately had to be postponed and has been moved to 03/27/2019   REVIEW OF SYSTEMS: [name] reports she is not feeling good today. She feels sick to her stomach a lot and can hardly eat anything. She notes taste changes as well. She reports feeling badly after she leaves the hospital, where she has been often recently due to not feeling well. She reports she doesn't sleep well; she wakes up in the middle of the night and can't fall back asleep. She states she doesn't do much these days because of how badly she feels. She reports some diarrhea, for which sometimes she takes Pepto or Imodium. She has lost about 20 lbs over the last year. Since her last visit, she presented to the ED with nausea and vomiting on 03/09/2019. A detailed review of systems was otherwise entirely negative.   HISTORY OF CURRENT ILLNESS: From the original consult note:   [name] is an 78 y.o. female from [place], [place].  She has a past medical history significant forPAD s/p stenting; moderate to severe pulmonary HTN; afib and AVR on Coumadin; HTN; HLD; DM; and CAD s/p CABG. the patient was admitted to the hospital for anemia.  She was having increasing fatigue and shortness of breath and had routine lab work drawn which showed a hemoglobin of 6.6.  Due to her abnormal lab work, she was advised to come to the emergency room for admission.  She was also admitted in February 2020 for symptomatic anemia.  She reports she has been anemic for at least a year and has been taking oral iron.  She underwent a colonoscopy and EGD on 01/22/2018.  The colonoscopy showed diverticulosis in the sigmoid colon and descending colon, internal hemorrhoids.  The upper endoscopy was normal except for erythematous mucosa in the antrum.  She had a repeat EGD on 11/21/2018 that showed a few minimally oozing( with scope trauma) superficial gastric ulcers with pigmented material were found in the gastric body. The largest lesion was 4 mm in largest dimension.  She underwent another upper endoscopy on 02/27/2019 which showed patchy mildly friable mucosa with contact bleeding was found in the gastric fundus, in the gastric body and in the gastric antrum.  The patient is maintained on warfarin due to A. fib and a mechanical aortic valve.  Review of the patient's chart shows that she has been anemic dating back to at least December thousand 12 (these are the earliest labs available to me).  Her highest hemoglobin was 11.6 in December 2012.  I also note that  the patient has had intermittent mild leukopenia adding back to at least April 2019.  Her lowest white blood cell count was 2.6 in April 2019.  Since admission, the patient has received 1 unit of packed red blood cells.  She is currently on heparin for her procedure with plan to bridge back to Coumadin.  Stool for occult blood was negative  x1 on 02/24/2019.  Hematology was asked see the patient to make recommendations regarding her anemia.  [name] subsequent history is as detailed below.   PAST MEDICAL HISTORY: Past Medical History:  Diagnosis Date  . Aortic atherosclerosis ([facility] [facility]) 01/23/2018  . Atrial fibrillation ([facility])   . Carotid artery disease ([facility])   . Cholelithiasis 01/23/2018  . Chronic anticoagulation   . Coronary artery disease    status post coronary artery bypass grafting times 07/10/2004  . Diabetes mellitus   . Hypercholesteremia   . Hypertension   . Mechanical heart valve present    H. aortic valve replacement at the time of bypass surgery October 2005  . Moderate to severe pulmonary hypertension ([place])   . Peripheral arterial disease ([facility])    history of left common iliac artery PTA and stenting for a chronic total occlusion 08/26/01     PAST SURGICAL HISTORY: Past Surgical History:  Procedure Laterality Date  . AORTIC VALVE REPLACEMENT    . CARDIAC CATHETERIZATION  11/10/2004   40% right common illiac, 70% in stent restenosis of distal left common illiac,   . CARDIAC CATHETERIZATION  05/18/2004   LAD 50-70% midstenosis, RCA dominant w/50% stenosis, 50% Right common Illiac artery ostial stenosis, 90% in stent restenosis within midportion of left common illiac stent  . Carotid Duplex  03/12/2012   RSA-elev. velocities suggestive of a 50-69% diameter reduction, Right&Left Bulb/Prox ICA-mild-mod.fibrous plaqueelevating Velocities abnormal study.  . CHOLECYSTECTOMY N/A 03/01/2018   Procedure: LAPAROSCOPIC CHOLECYSTECTOMY;  Surgeon: Judeth Horn, MD;  Location: [facility];  Service: General;  Laterality: N/A;  . COLONOSCOPY WITH PROPOFOL N/A 01/22/2018   Procedure: COLONOSCOPY WITH PROPOFOL;  Surgeon: Wilford Corner, MD;  Location: [facility];  Service: Endoscopy;  Laterality: N/A;  . ESOPHAGOGASTRODUODENOSCOPY (EGD) WITH PROPOFOL N/A 01/22/2018   Procedure: ESOPHAGOGASTRODUODENOSCOPY (EGD) WITH  PROPOFOL;  Surgeon: Wilford Corner, MD;  Location: [facility];  Service: Endoscopy;  Laterality: N/A;  . ESOPHAGOGASTRODUODENOSCOPY (EGD) WITH PROPOFOL N/A 11/21/2018   Procedure: ESOPHAGOGASTRODUODENOSCOPY (EGD) WITH PROPOFOL;  Surgeon: [name], MD;  Location: [facility];  Service: Gastroenterology;  Laterality: N/A;  . ESOPHAGOGASTRODUODENOSCOPY (EGD) WITH PROPOFOL Left 02/27/2019   Procedure: ESOPHAGOGASTRODUODENOSCOPY (EGD) WITH PROPOFOL;  Surgeon: Arta Silence, MD;  [facility];  Service: Endoscopy;  Laterality: Left;  . GIVENS CAPSULE STUDY N/A 11/21/2018   Procedure: GIVENS CAPSULE STUDY;  Surgeon: [name], MD;  Location: [facility];  Service: Gastroenterology;  Laterality: N/A;  To be deployed during EGD  . Lower Ext. Duplex  03/12/2012   Right Proximal CIA- vessel narrowing w/elevated velocities 0-49% diameter reduction. Right SFA-mild mixed density plaque throughout vessel.  [name] NM MYOCAR PERF WALL MOTION  05/19/2010   protocol: Persantine, post stress EF 65%, negative for ischemia, low risk scan  . RIGHT HEART CATH N/A 06/27/2018   Procedure: RIGHT HEART CATH;  Surgeon: [name], MD;  Location: [facility];  Service: Cardiovascular;  Laterality: N/A;  . TRANSTHORACIC ECHOCARDIOGRAM  08/29/2012   Moderately calcified annulus of mitral valve, moderate regurg. of both mitral valve and tricuspid valve.      FAMILY HISTORY: Family History  Problem Relation Age of Onset  . Breast cancer Neg Hx     GYNECOLOGIC HISTORY:  No LMP recorded. Patient has had a hysterectomy. Menarche:  years old Age at first live birth:  [place] P: 3 LMP:  Contraceptive:  HRT:   Hysterectomy?: yes BSO?:    SOCIAL HISTORY: (Current as of 03/07/2019) Laporsha is widowed.  She lives alone here in [place], [place].  She has 3 children that live locally.  She smoked three quarters of a pack of cigarettes daily for 50 years.  She quit in 2005.  Denies alcohol  use.   ADVANCED DIRECTIVES: Not in place    HEALTH MAINTENANCE: Social History   Tobacco Use  . Smoking status: Former Smoker    Packs/day: 1.00    Years: 30.00    Pack years: 30.00    Types: Cigarettes  . Smokeless tobacco: Never Used  Substance Use Topics  . Alcohol use: No    Alcohol/week: 0.0 standard drinks  . Drug use: No    Colonoscopy: EGD on 02/27/2019 under Dr. Paulita Fujita found some friable gastric mucosa.  Colonoscopy under Dr. Michail Sermon 01/22/2018 showed significant diverticular disease  PAP:   Bone density:  Mammography: 02/25/2017, breast density category B.  Allergies  Allergen Reactions  . Flagyl [Metronidazole] Rash    ALL-OVER BODY RASH  . Coreg [Carvedilol] Other (See Comments)    Terrible cramping in the feet and had a lot of bowel movements, but not diarrhea  . Losartan Swelling    Patient doesn't recall site of swelling  . Verapamil Hives  . Zetia [Ezetimibe] Other (See Comments)    Reaction not recalled  . Zocor [Simvastatin - High Dose] Other (See Comments)    Reaction not recalled    Current Outpatient Medications  Medication Sig Dispense Refill  . acetaminophen (TYLENOL) 325 MG tablet Take 2 tablets (650 mg total) by mouth every 6 (six) hours as needed for mild pain, fever or headache (for pain or headaches). (Patient taking differently: Take 500 mg by mouth every 6 (six) hours as needed for mild pain, fever or headache (for pain or headaches). )    . ALPRAZolam (XANAX) 0.5 MG tablet Take 0.25 mg by mouth at bedtime.     [name] atorvastatin (LIPITOR) 80 MG tablet Take 1 tablet (80 mg total) by mouth daily at 6 PM.    . Cholecalciferol (VITAMIN D3) 1000 UNITS CAPS Take 1,000 Units by mouth daily. 12 noon    . COLCRYS 0.6 MG tablet Take 0.6 mg by mouth daily as needed (gout flare).     . ferrous sulfate 325 (65 FE) MG tablet Take 1 tablet (325 mg total) by mouth 2 (two) times daily with a meal.    . furosemide (LASIX) 40 MG tablet Take 80 mg by mouth  daily.    . hydrALAZINE (APRESOLINE) 100 MG tablet TAKE 1 TABLET (100 MG TOTAL) BY MOUTH 3 (THREE) TIMES DAILY. 270 tablet 1  . irbesartan (AVAPRO) 300 MG tablet TAKE 1 TABLET DAILY (PLEASE MAKE APPOINTMENT FOR REFILLS) (Patient taking differently: Take 300 mg by mouth daily. ) 90 tablet 3  . magnesium oxide (MAG-OX) 400 (241.3 Mg) MG tablet Take 1 tablet (400 mg total) by mouth daily. (Patient taking differently: Take 400 mg by mouth every evening. )    . metFORMIN (GLUCOPHAGE) 500 MG tablet Take 500 mg by mouth 2 (two) times daily with a meal.     . metoprolol tartrate (LOPRESSOR) 25 MG tablet Take 0.5  tablets (12.5 mg total) by mouth 2 (two) times daily. 60 tablet 0  . ondansetron (ZOFRAN ODT) 4 MG disintegrating tablet Take 1 tablet (4 mg total) by mouth every 8 (eight) hours as needed for nausea or vomiting. 6 tablet 0  . ondansetron (ZOFRAN) 4 MG tablet Take 4 mg by mouth every 8 (eight) hours as needed for nausea or vomiting.     . pantoprazole (PROTONIX) 40 MG tablet Take 40 mg by mouth 2 (two) times daily.     [name] spironolactone (ALDACTONE) 25 MG tablet Take 1 tablet (25 mg total) by mouth daily. 90 tablet 3  . tadalafil (ADCIRCA/CIALIS) 20 MG tablet Take 1 tablet (20 mg total) by mouth 2 (two) times daily. 180 tablet 0  . vitamin B-12 (CYANOCOBALAMIN) 100 MCG tablet Take 100 mcg by mouth daily at 12 noon.     . warfarin (COUMADIN) 5 MG tablet TAKE 1.5 TO 2 TABLETS BY MOUTH DAILY AS DIRECTED BY COUMADIN CLINIC 135 tablet 1   No current facility-administered medications for this visit.      OBJECTIVE: Older white woman examined in a wheelchair  Vitals:   03/20/19 1057  BP: (!) 139/49  Pulse: (!) 59  Resp: 18  Temp: 98.9 F (37.2 C)  SpO2: 98%     Body mass index is 24.19 kg/m.   Wt Readings from Last 3 Encounters:  03/20/19 128 lb (58.1 kg)  03/09/19 131 lb 14.4 oz (59.8 kg)  03/07/19 131 lb 14.4 oz (59.8 kg)      ECOG FS:1 - Symptomatic but completely ambulatory  Sclerae  unicteric, EOMs intact Wearing a mask No cervical or supraclavicular adenopathy Lungs no rales or rhonchi Heart regular rate and rhythm Abd soft, nontender, positive bowel sounds Neuro: nonfocal, well oriented, appropriate affect Breasts: Deferred    LAB RESULTS:  CMP     Component Value Date/Time   NA 132 (L) 03/09/2019 1840   NA 137 12/06/2017 1436   K 3.8 03/09/2019 1840   CL 100 03/09/2019 1840   CO2 22 03/09/2019 1840   GLUCOSE 88 03/09/2019 1840   BUN 25 (H) 03/09/2019 1840   BUN 20 12/06/2017 1436   CREATININE 1.31 (H) 03/09/2019 1840   CALCIUM 9.1 03/09/2019 1840   PROT 5.8 (L) 03/09/2019 1840   ALBUMIN 2.7 (L) 03/09/2019 1840   AST 24 03/09/2019 1840   ALT 18 03/09/2019 1840   ALKPHOS 67 03/09/2019 1840   BILITOT 1.7 (H) 03/09/2019 1840   GFRNONAA 38 (L) 03/09/2019 1840   GFRAA 44 (L) 03/09/2019 1840    No results found for: TOTALPROTELP, ALBUMINELP, A1GS, A2GS, BETS, BETA2SER, GAMS, MSPIKE, SPEI  No results found for: KPAFRELGTCHN, LAMBDASER, KAPLAMBRATIO  Lab Results  Component Value Date   WBC 4.2 03/20/2019   NEUTROABS 3.5 03/20/2019   HGB 8.0 (L) 03/20/2019   HCT 24.5 (L) 03/20/2019   MCV 95.0 03/20/2019   PLT 143 (L) 03/20/2019    @LASTCHEMISTRY @  No results found for: LABCA2  No components found for: FYBOFB510  No results for input(s): INR in the last 168 hours.  No results found for: LABCA2  No results found for: CHE527  No results found for: POE423  No results found for: NTI144  No results found for: CA2729  No components found for: HGQUANT  No results found for: CEA1 / No results found for: CEA1   No results found for: AFPTUMOR  No results found for: [place]  No results found for: PSA1  Appointment on  03/20/2019  Component Date Value Ref Range Status  . Smear Review 03/20/2019 SMEAR STAINED AND AVAILABLE FOR REVIEW   Final   Performed at [facility], [address], [place], [place]  [address]  . Retic Ct Pct 03/20/2019 3.7* 0.4 - 3.1 % Final  . RBC. 03/20/2019 2.58* 3.87 - 5.11 MIL/uL Final  . Retic Count, Absolute 03/20/2019 94.7  19.0 - 186.0 K/uL Final  . Immature Retic Fract 03/20/2019 7.5  2.3 - 15.9 % Final   Performed at [facility], [place] [address], [place], [place] [address]  . WBC 03/20/2019 4.2  4.0 - 10.5 K/uL Final  . RBC 03/20/2019 2.58* 3.87 - 5.11 MIL/uL Final  . Hemoglobin 03/20/2019 8.0* 12.0 - 15.0 g/dL Final  . HCT 03/20/2019 24.5* 36.0 - 46.0 % Final  . MCV 03/20/2019 95.0  80.0 - 100.0 fL Final  . MCH 03/20/2019 31.0  26.0 - 34.0 pg Final  . MCHC 03/20/2019 32.7  30.0 - 36.0 g/dL Final  . RDW 03/20/2019 16.3* 11.5 - 15.5 % Final  . Platelets 03/20/2019 143* 150 - 400 K/uL Final  . nRBC 03/20/2019 0.0  0.0 - 0.2 % Final  . Neutrophils Relative % 03/20/2019 84  % Final  . Neutro Abs 03/20/2019 3.5  1.7 - 7.7 K/uL Final  . Lymphocytes Relative 03/20/2019 9  % Final  . Lymphs Abs 03/20/2019 0.4* 0.7 - 4.0 K/uL Final  . Monocytes Relative 03/20/2019 5  % Final  . Monocytes Absolute 03/20/2019 0.2  0.1 - 1.0 K/uL Final  . Eosinophils Relative 03/20/2019 1  % Final  . Eosinophils Absolute 03/20/2019 0.1  0.0 - 0.5 K/uL Final  . Basophils Relative 03/20/2019 1  % Final  . Basophils Absolute 03/20/2019 0.0  0.0 - 0.1 K/uL Final  . Immature Granulocytes 03/20/2019 0  % Final  . Abs Immature Granulocytes 03/20/2019 0.01  0.00 - 0.07 K/uL Final   Performed at [facility], [place] [address] [place],  [address]    (this displays the last labs from the last 3 days)  No results found for: TOTALPROTELP, ALBUMINELP, A1GS, A2GS, BETS, BETA2SER, GAMS, MSPIKE, SPEI (this displays SPEP labs)  No results found for: KPAFRELGTCHN, LAMBDASER, KAPLAMBRATIO (kappa/lambda light chains)  No results found for: HGBA, HGBA2QUANT, HGBFQUANT, HGBSQUAN (Hemoglobinopathy evaluation)   Lab Results  Component Value  Date   LDH 197 (H) 02/27/2019    Lab Results  Component Value Date   IRON 56 02/13/2019   TIBC 389 02/13/2019   IRONPCTSAT 14 02/13/2019   (Iron and TIBC)  Lab Results  Component Value Date   FERRITIN 652 (H) 03/07/2019    Urinalysis    Component Value Date/Time   COLORURINE YELLOW 03/09/2019 2119   APPEARANCEUR CLEAR 03/09/2019 2119   LABSPEC 1.006 03/09/2019 2119   PHURINE 7.0 03/09/2019 2119   [place] 03/09/2019 2119   [place] (A) 03/09/2019 2119   [place] 03/09/2019 2119   [place] 03/09/2019 2119   PROTEINUR 30 (A) 03/09/2019 2119   UROBILINOGEN 1.0 10/06/2011 1709   NITRITE NEGATIVE 03/09/2019 2119   LEUKOCYTESUR NEGATIVE 03/09/2019 2119     STUDIES:  Dg Chest 2 View  Result Date: 02/28/2019 CLINICAL DATA:  Fever. EXAM: CHEST - 2 VIEW COMPARISON:  Chest radiograph 11/20/2018 and CT 12/12/2018 FINDINGS: Sequelae of CABG and aortic valve replacement are again identified. The cardiac silhouette is mildly enlarged. There is increased ground-glass opacity in the  right lung base. Subtly increased hazy opacity is also present in the right greater than left mid lungs. No pleural effusion or pneumothorax is identified. Calcified granulomas are noted in both lungs. No acute osseous abnormality is seen. IMPRESSION: Hazy lung opacities most notable in the right lung base which are suspicious for pneumonia in this patient with fever (including viral/atypical etiologies). Electronically Signed   By: Logan Bores M.D.   On: 02/28/2019 10:00   Dg Abd 2 Views  Result Date: 03/09/2019 CLINICAL DATA:  Nausea, vomiting EXAM: ABDOMEN - 2 VIEW COMPARISON:  CT 02/21/2018 FINDINGS: Cardiomegaly. Calcified granulomas in the lungs bilaterally. Interstitial prominence throughout the lungs could reflect interstitial edema. Prior cholecystectomy. Nonobstructive bowel gas pattern. No organomegaly or free air. No suspicious calcification. Degenerative changes  in the lumbar spine and hips, right greater than left. IMPRESSION: No obstruction or free air. Cardiomegaly. Interstitial prominence throughout the lungs could reflect interstitial edema. Electronically Signed   By: Rolm Baptise M.D.   On: 03/09/2019 19:12     ELIGIBLE FOR AVAILABLE RESEARCH PROTOCOL: no   ASSESSMENT: 78 y.o. [place] woman with multifactorial anemia, as follows:             (a) anemia of chronic inflammation: As of May 2020 she has a SED rate of 95, CRP 1.1, positive ANA and RF; she has been referred to rheumatology with an appointment pending 03/27/2019 inflammation makes iron stores relatively inaccessible for hematopoiesis and blunts the effect of native EPO             (b) hemolysis (mild): she has a mildly elevated LDH and a DAT positive for complement, not IgG; this is c/w (a) above             (c) anemia of renal insufficiency: inadequate EPO resonse to anemia            (d) occult  (d) GIB/ iron deficiency in patient on lifelong anticoagulaton  (1) Feraheme received 03/01/2019, to be repeated 03/20/2019   (a) reticulocyte 03/07/2019 up from 43.4 a year ago to 191.8 after iron infusion  (2) darbepoetin: Consider starting 03/20/2019 and repeat every 28 days; however given her excellent reticulocyte response to iron replacement without EPO, we will likely hold on darbepoetin for now    PLAN: [name] did fine with her first Feraheme infusion and she will receive the second 1 today.  She understands that this is the equivalent of 6 months of oral iron replacement so she does not need to take oral iron which she was not tolerating well  Of course she is going to continue to have some occult bleeding and she does need to continue on her anticoagulation.  As far as the iron replacement goes I am going to be seeing her or at least getting lab work every 3 months indefinitely and replacing the iron as needed.  As noted above she has more than 1 reason for anemia.  I think the  main one at this point is her likely rheumatologic condition and this is going to be evaluated by Dr. Lenna Gilford next week.  I am hoping once the patient is evaluated and treated for that her hemoglobin will drift up to the 9-10 range.  As far as her sense of taste is concerned I think this is due to the antibiotics she received recently and I suggested that she start probiotics  I have made her a return appointment here on July 30 but if she is feeling considerably  better by then she does not really need to come back until September and she will cancel the July appointment  She knows to call for any other issue that may develop before the next visit.  Valli Randol, [name], MD  03/20/19 11:30 AM Medical Oncology and Hematology [facility] [address] [place], [place] [address] Tel. [phone]    Fax. [phone]    I, Wilburn Mylar, am acting as scribe for Dr. [name]. Valrie Jia.  I, Lurline Del MD, have reviewed the above documentation for accuracy and completeness, and I agree with the above.

## 2019-03-19 NOTE — Patient Outreach (Signed)
[place] [facility]) Care Management  03/19/2019  JANNETT SCHMALL 1941-09-26 [id number]   Call placed to member to follow up on post hospital recovery and to attempt to complete initial assessment.  Second unsuccessful outreach.  Unable to leave a message on both listed home number (rang busy) and listed cell number (voice mail not set up).  Unsuccessful outreach letter sent to member, will follow up within the next 3-4 business days.  [name], [place], MSN [place] [phone]

## 2019-03-20 ENCOUNTER — Inpatient Hospital Stay: Payer: Medicare HMO

## 2019-03-20 ENCOUNTER — Inpatient Hospital Stay: Payer: Medicare HMO | Attending: Oncology | Admitting: Oncology

## 2019-03-20 ENCOUNTER — Other Ambulatory Visit: Payer: Self-pay

## 2019-03-20 VITALS — BP 138/40 | HR 57 | Resp 17

## 2019-03-20 VITALS — BP 139/49 | HR 59 | Temp 98.9°F | Resp 18 | Ht 61.0 in | Wt 128.0 lb

## 2019-03-20 DIAGNOSIS — N289 Disorder of kidney and ureter, unspecified: Secondary | ICD-10-CM | POA: Diagnosis not present

## 2019-03-20 DIAGNOSIS — I739 Peripheral vascular disease, unspecified: Secondary | ICD-10-CM

## 2019-03-20 DIAGNOSIS — D594 Other nonautoimmune hemolytic anemias: Secondary | ICD-10-CM

## 2019-03-20 DIAGNOSIS — D508 Other iron deficiency anemias: Secondary | ICD-10-CM

## 2019-03-20 DIAGNOSIS — D649 Anemia, unspecified: Secondary | ICD-10-CM | POA: Diagnosis not present

## 2019-03-20 DIAGNOSIS — D696 Thrombocytopenia, unspecified: Secondary | ICD-10-CM | POA: Diagnosis not present

## 2019-03-20 DIAGNOSIS — D500 Iron deficiency anemia secondary to blood loss (chronic): Secondary | ICD-10-CM

## 2019-03-20 LAB — CBC WITH DIFFERENTIAL/PLATELET
Abs Immature Granulocytes: 0.01 10*3/uL (ref 0.00–0.07)
Basophils Absolute: 0 10*3/uL (ref 0.0–0.1)
Basophils Relative: 1 %
Eosinophils Absolute: 0.1 10*3/uL (ref 0.0–0.5)
Eosinophils Relative: 1 %
HCT: 24.5 % — ABNORMAL LOW (ref 36.0–46.0)
Hemoglobin: 8 g/dL — ABNORMAL LOW (ref 12.0–15.0)
Immature Granulocytes: 0 %
Lymphocytes Relative: 9 %
Lymphs Abs: 0.4 10*3/uL — ABNORMAL LOW (ref 0.7–4.0)
MCH: 31 pg (ref 26.0–34.0)
MCHC: 32.7 g/dL (ref 30.0–36.0)
MCV: 95 fL (ref 80.0–100.0)
Monocytes Absolute: 0.2 10*3/uL (ref 0.1–1.0)
Monocytes Relative: 5 %
Neutro Abs: 3.5 10*3/uL (ref 1.7–7.7)
Neutrophils Relative %: 84 %
Platelets: 143 10*3/uL — ABNORMAL LOW (ref 150–400)
RBC: 2.58 MIL/uL — ABNORMAL LOW (ref 3.87–5.11)
RDW: 16.3 % — ABNORMAL HIGH (ref 11.5–15.5)
WBC: 4.2 10*3/uL (ref 4.0–10.5)
nRBC: 0 % (ref 0.0–0.2)

## 2019-03-20 LAB — RETICULOCYTES
Immature Retic Fract: 7.5 % (ref 2.3–15.9)
RBC.: 2.58 MIL/uL — ABNORMAL LOW (ref 3.87–5.11)
Retic Count, Absolute: 94.7 10*3/uL (ref 19.0–186.0)
Retic Ct Pct: 3.7 % — ABNORMAL HIGH (ref 0.4–3.1)

## 2019-03-20 LAB — FERRITIN: Ferritin: 338 ng/mL — ABNORMAL HIGH (ref 11–307)

## 2019-03-20 LAB — SAVE SMEAR(SSMR), FOR PROVIDER SLIDE REVIEW

## 2019-03-20 MED ORDER — SODIUM CHLORIDE 0.9 % IV SOLN
510.0000 mg | Freq: Once | INTRAVENOUS | Status: AC
  Administered 2019-03-20: 510 mg via INTRAVENOUS
  Filled 2019-03-20: qty 17

## 2019-03-20 MED ORDER — SODIUM CHLORIDE 0.9 % IV SOLN
Freq: Once | INTRAVENOUS | Status: AC
  Administered 2019-03-20: 12:00:00 via INTRAVENOUS
  Filled 2019-03-20: qty 250

## 2019-03-20 NOTE — Patient Instructions (Signed)

## 2019-03-20 NOTE — Progress Notes (Signed)
Per Dr. Jana Hakim, pt will not receive aranesp today.

## 2019-03-21 ENCOUNTER — Telehealth: Payer: Self-pay | Admitting: Cardiovascular Disease

## 2019-03-21 ENCOUNTER — Other Ambulatory Visit (HOSPITAL_COMMUNITY): Payer: Self-pay | Admitting: Cardiology

## 2019-03-21 ENCOUNTER — Telehealth: Payer: Self-pay | Admitting: Oncology

## 2019-03-21 NOTE — Telephone Encounter (Signed)
NEW MESSAGE    Per pt left leg very sore and does not feel right.  Per pt had stint put in 2002.  Pt would like a call back she has some questions.

## 2019-03-21 NOTE — Telephone Encounter (Signed)
She needs venous Doppler studies on Monday to rule out DVT then midlevel provider

## 2019-03-21 NOTE — Telephone Encounter (Signed)
I talk with patient regarding schedule  

## 2019-03-21 NOTE — Telephone Encounter (Signed)
Spoke with the patient. She stated that for the past nine days she has had trouble with edema in her left leg but this has been mild.  Since Wednesday, she feels like the edema has worsened. She stated that her left leg is swollen from the foot to the knee. She does have pain when she ambulates in the calf area but does not have pain when she is resting.   She denies any discoloration to the leg and foot, no temperature change and denies any shortness of breath. She did say that the swelling in her foot area seems to go down overnight and worsens throughout the day.  She is currently on Furosemide 80 mg once daily and is due for an Aorta/Iliac and ABI in July. She previously had stents placed in 2002 in her left iliac.

## 2019-03-22 ENCOUNTER — Other Ambulatory Visit: Payer: Self-pay

## 2019-03-22 ENCOUNTER — Emergency Department (HOSPITAL_BASED_OUTPATIENT_CLINIC_OR_DEPARTMENT_OTHER): Payer: Medicare HMO

## 2019-03-22 ENCOUNTER — Emergency Department (HOSPITAL_COMMUNITY)
Admission: EM | Admit: 2019-03-22 | Discharge: 2019-03-22 | Disposition: A | Discharge status: Home / Self Care | Payer: Medicare HMO | Attending: Emergency Medicine | Admitting: Emergency Medicine

## 2019-03-22 ENCOUNTER — Telehealth: Payer: Self-pay | Admitting: Student

## 2019-03-22 DIAGNOSIS — M7989 Other specified soft tissue disorders: Secondary | ICD-10-CM | POA: Diagnosis not present

## 2019-03-22 DIAGNOSIS — R600 Localized edema: Secondary | ICD-10-CM | POA: Diagnosis present

## 2019-03-22 DIAGNOSIS — L03116 Cellulitis of left lower limb: Secondary | ICD-10-CM | POA: Diagnosis not present

## 2019-03-22 DIAGNOSIS — I2510 Atherosclerotic heart disease of native coronary artery without angina pectoris: Secondary | ICD-10-CM | POA: Diagnosis not present

## 2019-03-22 DIAGNOSIS — R52 Pain, unspecified: Secondary | ICD-10-CM | POA: Diagnosis not present

## 2019-03-22 DIAGNOSIS — Z7901 Long term (current) use of anticoagulants: Secondary | ICD-10-CM | POA: Insufficient documentation

## 2019-03-22 DIAGNOSIS — I10 Essential (primary) hypertension: Secondary | ICD-10-CM | POA: Insufficient documentation

## 2019-03-22 MED ORDER — CEPHALEXIN 250 MG PO CAPS
500.0000 mg | ORAL_CAPSULE | Freq: Once | ORAL | Status: AC
  Administered 2019-03-22: 500 mg via ORAL
  Filled 2019-03-22: qty 2

## 2019-03-22 MED ORDER — CEPHALEXIN 500 MG PO CAPS: 500.0000 mg | ORAL_CAPSULE | Freq: Four times a day (QID) | ORAL | 0 refills | Status: DC

## 2019-03-22 NOTE — ED Provider Notes (Signed)
[facility] EMERGENCY DEPARTMENT Provider Note   CSN: [id number] Arrival date & time: 03/22/19  1751    History   Chief Complaint Chief Complaint  Patient presents with  . Leg Swelling    HPI [name] is a 78 y.o. female.     HPI Patient was hospitalized a couple weeks ago.  She reports she started to get swelling of her left lower leg.  She was discharged on 5\27\2020.  Hospitalization was for anemia with gastric ulcers.  Patient is on Coumadin for mechanical valve.  She did require transfusion.  She reports that the swelling occurred gradually and now has become very uncomfortable.  Denies she has developed any chest pain or shortness of breath.  She has not developed any fever or cough.  She denies any injury.  She denies any history of similar swelling. Past Medical History:  Diagnosis Date  . Aortic atherosclerosis ([place]) 01/23/2018  . Atrial fibrillation ([place])   . Carotid artery disease ([place])   . Cholelithiasis 01/23/2018  . Chronic anticoagulation   . Coronary artery disease    status post coronary artery bypass grafting times 07/10/2004  . Diabetes mellitus   . Hypercholesteremia   . Hypertension   . Mechanical heart valve present    H. aortic valve replacement at the time of bypass surgery October 2005  . Moderate to severe pulmonary hypertension ([place])   . Peripheral arterial disease (HCC)    history of left common iliac artery PTA and stenting for a chronic total occlusion 08/26/01    Patient Active Problem List   Diagnosis Date Noted  . Iron deficiency anemia 02/28/2019  . 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 ([place]) 02/28/2019  . CKD (chronic kidney disease) stage 3, GFR 30-59 ml/min (HCC) 02/24/2019  . Malnutrition of moderate degree 03/12/2018  . Acute on chronic diastolic heart failure ([place]) 03/06/2018  . Diabetes mellitus type 2, controlled ([place]) 03/06/2018  . Chronic atrial fibrillation 03/06/2018  . S/P  cholecystectomy 03/06/2018  . HTN (hypertension) 03/06/2018  . H/O mechanical aortic valve replacement 03/06/2018  . Acute hyponatremia 03/06/2018  . Acute respiratory failure with hypoxia ([place]) 03/06/2018  . Moderate to severe pulmonary hypertension ([place]) 03/06/2018  . UTI (urinary tract infection) 02/22/2018  . Cholecystitis 02/21/2018  . GERD (gastroesophageal reflux disease) 02/21/2018  . Anxiety 02/21/2018  . Chronic diastolic CHF (congestive heart failure) ([place]) 02/21/2018  . Diarrhea 02/21/2018  . Rash 02/21/2018  . General weakness 02/02/2018  . Macular rash 01/25/2018  . Thrombocytopenia ([place]) 01/25/2018  . Arm pain, diffuse 01/25/2018  . Anemia associated with stage 3 chronic renal failure ([place]) 01/23/2018  . Cholelithiasis 01/23/2018  . Aortic atherosclerosis ([place]) 01/23/2018  . GI bleed 01/18/2018  . Atrial fibrillation ([place]) 01/12/2016  . Coronary artery disease 08/18/2013  . Carotid artery disease ([place]) 08/18/2013  . Peripheral arterial disease ([place]) 08/18/2013  . Diabetes mellitus without complication ([place]) 37/90/2409  . Benign positional vertigo 10/06/2011  . Hyponatremia 10/06/2011  . Influenza 10/06/2011  . Sinusitis 10/06/2011  . Hypertension   . Hypercholesteremia   . Mechanical heart valve present   . Chronic anticoagulation     Past Surgical History:  Procedure Laterality Date  . AORTIC VALVE REPLACEMENT    . CARDIAC CATHETERIZATION  11/10/2004   40% right common illiac, 70% in stent restenosis of distal left common illiac,   . CARDIAC CATHETERIZATION  05/18/2004   LAD 50-70% midstenosis, RCA dominant w/50% stenosis, 50% Right common Illiac  artery ostial stenosis, 90% in stent restenosis within midportion of left common illiac stent  . Carotid Duplex  03/12/2012   RSA-elev. velocities suggestive of a 50-69% diameter reduction, Right&Left Bulb/Prox ICA-mild-mod.fibrous plaqueelevating Velocities abnormal study.  . CHOLECYSTECTOMY N/A 03/01/2018    Procedure: LAPAROSCOPIC CHOLECYSTECTOMY;  Surgeon: Judeth Horn, MD;  Location: [facility];  Service: General;  Laterality: N/A;  . COLONOSCOPY WITH PROPOFOL N/A 01/22/2018   Procedure: COLONOSCOPY WITH PROPOFOL;  Surgeon: Wilford Corner, MD;  Location: [facility];  Service: Endoscopy;  Laterality: N/A;  . ESOPHAGOGASTRODUODENOSCOPY (EGD) WITH PROPOFOL N/A 01/22/2018   Procedure: ESOPHAGOGASTRODUODENOSCOPY (EGD) WITH PROPOFOL;  Surgeon: Wilford Corner, MD;  Location: [facility];  Service: Endoscopy;  Laterality: N/A;  . ESOPHAGOGASTRODUODENOSCOPY (EGD) WITH PROPOFOL N/A 11/21/2018   Procedure: ESOPHAGOGASTRODUODENOSCOPY (EGD) WITH PROPOFOL;  Surgeon: [name], MD;  Location: [facility];  Service: Gastroenterology;  Laterality: N/A;  . ESOPHAGOGASTRODUODENOSCOPY (EGD) WITH PROPOFOL Left 02/27/2019   Procedure: ESOPHAGOGASTRODUODENOSCOPY (EGD) WITH PROPOFOL;  Surgeon: Arta Silence, MD;  Location: [facility] ENDOSCOPY;  Service: Endoscopy;  Laterality: Left;  . GIVENS CAPSULE STUDY N/A 11/21/2018   Procedure: GIVENS CAPSULE STUDY;  Surgeon: [name], MD;  Location: [facility];  Service: Gastroenterology;  Laterality: N/A;  To be deployed during EGD  . Lower Ext. Duplex  03/12/2012   Right Proximal CIA- vessel narrowing w/elevated velocities 0-49% diameter reduction. Right SFA-mild mixed density plaque throughout vessel.  [name] NM MYOCAR PERF WALL MOTION  05/19/2010   protocol: Persantine, post stress EF 65%, negative for ischemia, low risk scan  . RIGHT HEART CATH N/A 06/27/2018   Procedure: RIGHT HEART CATH;  Surgeon: [name], MD;  Location: [facility] CV LAB;  Service: Cardiovascular;  Laterality: N/A;  . TRANSTHORACIC ECHOCARDIOGRAM  08/29/2012   Moderately calcified annulus of mitral valve, moderate regurg. of both mitral valve and tricuspid valve.      OB History   No obstetric history on file.      Home Medications    Prior to Admission medications   Medication Sig Start  Date End Date Taking? Authorizing Provider  acetaminophen (TYLENOL) 325 MG tablet Take 2 tablets (650 mg total) by mouth every 6 (six) hours as needed for mild pain, fever or headache (for pain or headaches). Patient taking differently: Take 650 mg by mouth every 6 (six) hours as needed for fever or headache (pain).  03/03/18  Yes Rayburn, Floyce Stakes, PA-C  ALPRAZolam (XANAX) 0.5 MG tablet Take 0.25 mg by mouth at bedtime.    Yes [provider]  atorvastatin (LIPITOR) 80 MG tablet Take 1 tablet (80 mg total) by mouth daily at 6 PM. 11/22/18  Yes [name], MD  Cholecalciferol (VITAMIN D3) 1000 UNITS CAPS Take 1,000 Units by mouth daily with lunch.    Yes [provider]  colchicine 0.6 MG tablet Take 0.6 mg by mouth daily as needed (gout attack).   Yes [provider]  Cyanocobalamin (VITAMIN B12) 1000 MCG TBCR Take 1,000 mcg by mouth daily with lunch.    Yes [provider]  furosemide (LASIX) 40 MG tablet Take 80 mg by mouth daily.   Yes [provider]  hydrALAZINE (APRESOLINE) 100 MG tablet TAKE 1 TABLET (100 MG TOTAL) BY MOUTH 3 (THREE) TIMES DAILY. 01/20/19  Yes [name], MD  irbesartan (AVAPRO) 300 MG tablet TAKE 1 TABLET DAILY (PLEASE MAKE APPOINTMENT FOR REFILLS) Patient taking differently: Take 300 mg by mouth daily.  08/06/18  Yes Lorretta Harp, MD  Magnesium 250 MG TABS Take 250 mg by mouth at bedtime.   Yes [provider]  metFORMIN (GLUCOPHAGE) 500 MG tablet Take 500 mg by mouth 2 (two) times daily with a meal.    Yes [provider]  metoprolol tartrate (LOPRESSOR) 25 MG tablet Take 0.5 tablets (12.5 mg total) by mouth 2 (two) times daily. 03/05/19  Yes [place] Eric J, DO  ondansetron (ZOFRAN) 4 MG tablet Take 4 mg by mouth every 8 (eight) hours as needed for nausea or vomiting.  03/22/18  Yes [provider]  pantoprazole (PROTONIX) 40 MG tablet Take 40 mg by mouth 2 (two) times daily.  12/05/14  Yes  [provider]  Probiotic Product (PROBIOTIC PO) Take 1 tablet by mouth daily.   Yes [provider]  spironolactone (ALDACTONE) 25 MG tablet TAKE ONE-HALF TABLET BY MOUTH DAILY Patient taking differently: Take 12.5 mg by mouth daily.  03/21/19  Yes [name], MD  tadalafil (ADCIRCA/CIALIS) 20 MG tablet Take 1 tablet (20 mg total) by mouth 2 (two) times daily. 01/02/19  Yes [name], MD  vitamin C (ASCORBIC ACID) 500 MG tablet Take 500 mg by mouth daily with lunch.   Yes [provider]  warfarin (COUMADIN) 5 MG tablet TAKE 1.5 TO 2 TABLETS BY MOUTH DAILY AS DIRECTED BY COUMADIN CLINIC Patient taking differently: Take 5-7.5 mg by mouth See admin instructions. Take 1 tablet (5 mg) by mouth on Monday and Friday morning, take 1 1/2 tablet (7.5 mg) on Sunday, Tuesday, Wednesday, Thursday, Saturday morning. 03/17/19  Yes Lorretta Harp, MD  cephALEXin (KEFLEX) 500 MG capsule Take 1 capsule (500 mg total) by mouth 4 (four) times daily. 03/22/19   Charlesetta Shanks, MD  ferrous sulfate 325 (65 FE) MG tablet Take 1 tablet (325 mg total) by mouth 2 (two) times daily with a meal. Patient not taking: Reported on 03/22/2019 11/22/18   [name], MD  magnesium oxide (MAG-OX) 400 (241.3 Mg) MG tablet Take 1 tablet (400 mg total) by mouth daily. Patient not taking: Reported on 03/22/2019 01/30/18   [name], MD  ondansetron (ZOFRAN ODT) 4 MG disintegrating tablet Take 1 tablet (4 mg total) by mouth every 8 (eight) hours as needed for nausea or vomiting. Patient not taking: Reported on 03/22/2019 03/09/19   [name], [name], PA-C    Family History Family History  Problem Relation Age of Onset  . Breast cancer Neg Hx     Social History Social History   Tobacco Use  . Smoking status: Former Smoker    Packs/day: 1.00    Years: 30.00    Pack years: 30.00    Types: Cigarettes  . Smokeless tobacco: Never Used  Substance Use Topics  . Alcohol use: No     Alcohol/week: 0.0 standard drinks  . Drug use: No     Allergies   Flagyl [metronidazole], Coreg [carvedilol], Losartan, Verapamil, Zetia [ezetimibe], and Zocor [simvastatin - high dose]   Review of Systems Review of Systems 10 Systems reviewed and are negative for acute change except as noted in the HPI.   Physical Exam Updated Vital Signs BP (!) 170/56   Pulse 63   Temp 98.3 F (36.8 C)   Resp (!) 23   Ht 5\' 1"  (1.549 m)   Wt 58.1 kg   SpO2 97%   BMI 24.19 kg/m   Physical Exam Constitutional:      Comments: Nontoxic.  No respiratory distress at rest.  HENT:  Head: Normocephalic and atraumatic.  Neck:     Musculoskeletal: Neck supple.  Cardiovascular:     Pulses: Normal pulses.     Comments: Heart regular.  Mechanical closure click. Pulmonary:     Comments: Lungs are grossly clear.  No rhonchi or rale. Abdominal:     General: There is no distension.     Palpations: Abdomen is soft.  Musculoskeletal:     Comments: Left lower leg has 2+ pitting edema and diffuse blanching erythema.  Tender to palpation diffusely.  Right lower extremity is normal without edema.  Both feet are warm and dry.  2+ palpable pulse in the right foot dorsalis pedis 1+ palpable pulse left foot dorsalis pedis.  No mottling, skin changes or ulcerations.  Skin:    General: Skin is warm and dry.  Neurological:     General: No focal deficit present.     Mental Status: She is oriented to person, place, and time.     Coordination: Coordination normal.  Psychiatric:        Mood and Affect: Mood normal.        ED Treatments / Results  Labs (all labs ordered are listed, but only abnormal results are displayed) Labs Reviewed - No data to display  EKG None  Radiology No results found.  Procedures Procedures (including critical care time)  Medications Ordered in ED Medications  cephALEXin (KEFLEX) capsule 500 mg (500 mg Oral Given 03/22/19 2103)     Initial Impression /  Assessment and Plan / ED Course  I have reviewed the triage vital signs and the nursing notes.  Pertinent labs & imaging results that were available during my care of the patient were reviewed by me and considered in my medical decision making (see chart for details).       Patient has asymmetric swelling of the lower extremities.  DVT study is negative.  She does report tenderness diffusely to palpation of the soft tissues.  Pulses are palpable bilaterally.  Pulses slightly less palpable on the left but foot is also edematous.  No skin changes or ulcers to suggest chronic or acute ischemia.  There is faint blanching erythema.  With combination of swelling, discomfort to palpation of the soft tissues and faint erythema, at this time will treat for cellulitis.  Patient is nontoxic and I feel stable for outpatient follow-up.  Turn precautions and follow-up plan reviewed.  Final Clinical Impressions(s) / ED Diagnoses   Final diagnoses:  Left leg swelling  Cellulitis of left lower extremity    ED Discharge Orders         Ordered    cephALEXin (KEFLEX) 500 MG capsule  4 times daily     03/22/19 2053           Charlesetta Shanks, MD 03/24/19 2156

## 2019-03-22 NOTE — Discharge Instructions (Addendum)
1.  Try to keep your leg elevated as much as possible. 2.  Take Keflex as prescribed. 3.  Make an appointment to see your doctor for recheck as soon as possible. 4.  Return to the emergency department if your redness or swelling are worsening, you develop fever or other concerning symptoms.

## 2019-03-22 NOTE — Telephone Encounter (Signed)
     Patient called the after hours line to report worsening pain along her left calf since last Wednesday. Occurring at rest or with activity. Pain has continued to intensify and she can barely put any weight on her leg. Reports good compliance with her Coumadin but her last INR check was in 02/2019. Denies any associated chest pain or dyspnea.   Dr. [name] had previously recommended a doppler study on Monday to rule-out a DVT. Given her progressive symptoms, I recommended she proceed to an Urgent Care or the Emergency Dept for evaluation today. She plans to have her neighbor drive her to [facility] ED within the next 20 minutes.   Signed, Erma Heritage, PA-C 03/22/2019, 5:10 PM Pager: [phone]

## 2019-03-22 NOTE — ED Triage Notes (Signed)
Pt c/o left leg pain/swelling that began while she was hospitalized a couple of weeks ago ; pt denies any trauma to the area; pt denies any chest pain or sob

## 2019-03-22 NOTE — Progress Notes (Signed)
VASCULAR LAB PRELIMINARY  PRELIMINARY  PRELIMINARY  PRELIMINARY  Left lower extremity venous duplex completed.    Preliminary report:  See CV proc for preliminary results.  Gave results to Dr. Johnney Killian.   Brinlee Gambrell, RVT 03/22/2019, 8:17 PM

## 2019-03-23 ENCOUNTER — Other Ambulatory Visit: Payer: Self-pay

## 2019-03-23 DIAGNOSIS — I82402 Acute embolism and thrombosis of unspecified deep veins of left lower extremity: Secondary | ICD-10-CM

## 2019-03-23 NOTE — Telephone Encounter (Signed)
Order in epic; will contact pt to set up appt with APP for week of 6/15-6/19

## 2019-03-24 ENCOUNTER — Other Ambulatory Visit: Payer: Self-pay | Admitting: *Deleted

## 2019-03-24 ENCOUNTER — Telehealth: Payer: Self-pay

## 2019-03-24 ENCOUNTER — Encounter: Payer: Self-pay | Admitting: *Deleted

## 2019-03-24 NOTE — Patient Outreach (Signed)
[facility]) Care Management  03/24/2019  [name] January 15, 1941 [id number]   Third attempt made to complete initial assessment, this time successful.  She report she has had some complications, was seen in the hospital ED for what she thought was a DVT but was diagnosed with cellulitis instead.  State she was placed on antibiotics which has now caused diarrhea.  She has been taking probiotics hoping it will help with the diarrhea, will also take imodium.  She report issues with her anemia are much better, denies any signs of bleeding at this time.  Will have follow up with hematologist on 7/27, hoping she does not have to have another infusion until September.  She also has follow up appointments with rheumatology on 6/18, pulmonologist on 6/29, and cardiologist on 7/7.  She does not follow up with primary MD until September.  Denies any urgent concerns at this time, advised to contact this care manager with questions.  Will follow up within the next month.  Fall Risk  03/07/2019 12/27/2018 10/22/2018 08/22/2018 07/09/2018  Falls in the past year? 1 0 0 1 No  Comment - - - - -  Number falls in past yr: 1 - - 0 -  Injury with Fall? 1 - - 0 -  Risk for fall due to : History of fall(s) - - - -  Risk for fall due to: Comment - - - - -  Follow up Falls prevention discussed - - - -   Depression screen [facility] 2/9 03/24/2019 07/09/2018 06/13/2018 04/04/2018  Decreased Interest 0 0 0 0  Down, Depressed, Hopeless 1 0 0 0  PHQ - 2 Score 1 0 0 0  Some recent data might be hidden   [facility] CM Care Plan Problem One     Most Recent Value  Care Plan Problem One  Risk for hospitalization related to anemia as evidenced by recent hospital admission  Role Documenting the Problem One  Care Management [place] for Problem One  Active  [facility] Long Term Goal   Member will not be readmitted to hospital within the next 31 days  [facility] Long Term Goal Start Date  03/07/19  Interventions for Problem  One Long Term Goal  Reviewd plan of care (medication management, close follow up with MD) with member in effort to decrease risk of readmission.  [facility] CM Short Term Goal #1   Member will report taking medications as prescribed over the next 4 weeks  [facility] CM Short Term Goal #1 Start Date  03/07/19  Interventions for Short Term Goal #1  Reviewed new medications (antibiotics) and importance of completing course for desired effectiveness  [facility] CM Short Term Goal #2   Member will report follow up with PCP within the next week  [facility] CM Short Term Goal #2 Start Date  03/07/19  [facility] CM Short Term Goal #2 Met Date  03/24/19     [name], RN, MSN [place] Manager [phone]

## 2019-03-24 NOTE — Telephone Encounter (Signed)

## 2019-03-27 DIAGNOSIS — M150 Primary generalized (osteo)arthritis: Secondary | ICD-10-CM | POA: Diagnosis not present

## 2019-03-27 DIAGNOSIS — R5383 Other fatigue: Secondary | ICD-10-CM | POA: Diagnosis not present

## 2019-03-27 DIAGNOSIS — M1009 Idiopathic gout, multiple sites: Secondary | ICD-10-CM | POA: Diagnosis not present

## 2019-03-27 DIAGNOSIS — M2550 Pain in unspecified joint: Secondary | ICD-10-CM | POA: Diagnosis not present

## 2019-03-27 DIAGNOSIS — I272 Pulmonary hypertension, unspecified: Secondary | ICD-10-CM | POA: Diagnosis not present

## 2019-03-27 DIAGNOSIS — R768 Other specified abnormal immunological findings in serum: Secondary | ICD-10-CM | POA: Diagnosis not present

## 2019-04-01 DIAGNOSIS — K529 Noninfective gastroenteritis and colitis, unspecified: Secondary | ICD-10-CM | POA: Diagnosis not present

## 2019-04-01 DIAGNOSIS — D500 Iron deficiency anemia secondary to blood loss (chronic): Secondary | ICD-10-CM | POA: Diagnosis not present

## 2019-04-01 DIAGNOSIS — Z9049 Acquired absence of other specified parts of digestive tract: Secondary | ICD-10-CM | POA: Diagnosis not present

## 2019-04-01 DIAGNOSIS — Z7901 Long term (current) use of anticoagulants: Secondary | ICD-10-CM | POA: Diagnosis not present

## 2019-04-03 ENCOUNTER — Other Ambulatory Visit: Payer: Self-pay

## 2019-04-03 ENCOUNTER — Inpatient Hospital Stay (HOSPITAL_COMMUNITY): Payer: Medicare HMO

## 2019-04-03 ENCOUNTER — Encounter (HOSPITAL_COMMUNITY): Payer: Self-pay | Admitting: Emergency Medicine

## 2019-04-03 ENCOUNTER — Inpatient Hospital Stay (HOSPITAL_COMMUNITY)
Admission: EM | Admit: 2019-04-03 | Discharge: 2019-04-07 | DRG: 377 | Disposition: A | Discharge status: Home / Self Care | Payer: Medicare HMO | Attending: Internal Medicine | Admitting: Internal Medicine

## 2019-04-03 DIAGNOSIS — N183 Chronic kidney disease, stage 3 unspecified: Secondary | ICD-10-CM | POA: Diagnosis present

## 2019-04-03 DIAGNOSIS — J9601 Acute respiratory failure with hypoxia: Secondary | ICD-10-CM | POA: Diagnosis present

## 2019-04-03 DIAGNOSIS — I480 Paroxysmal atrial fibrillation: Secondary | ICD-10-CM | POA: Diagnosis present

## 2019-04-03 DIAGNOSIS — D649 Anemia, unspecified: Secondary | ICD-10-CM | POA: Diagnosis present

## 2019-04-03 DIAGNOSIS — K921 Melena: Secondary | ICD-10-CM | POA: Diagnosis not present

## 2019-04-03 DIAGNOSIS — R0603 Acute respiratory distress: Secondary | ICD-10-CM | POA: Diagnosis not present

## 2019-04-03 DIAGNOSIS — L03116 Cellulitis of left lower limb: Secondary | ICD-10-CM | POA: Diagnosis not present

## 2019-04-03 DIAGNOSIS — D589 Hereditary hemolytic anemia, unspecified: Secondary | ICD-10-CM | POA: Diagnosis not present

## 2019-04-03 DIAGNOSIS — I272 Pulmonary hypertension, unspecified: Secondary | ICD-10-CM | POA: Diagnosis present

## 2019-04-03 DIAGNOSIS — M069 Rheumatoid arthritis, unspecified: Secondary | ICD-10-CM | POA: Diagnosis present

## 2019-04-03 DIAGNOSIS — E1122 Type 2 diabetes mellitus with diabetic chronic kidney disease: Secondary | ICD-10-CM | POA: Diagnosis present

## 2019-04-03 DIAGNOSIS — D594 Other nonautoimmune hemolytic anemias: Secondary | ICD-10-CM | POA: Diagnosis not present

## 2019-04-03 DIAGNOSIS — I482 Chronic atrial fibrillation, unspecified: Secondary | ICD-10-CM | POA: Diagnosis not present

## 2019-04-03 DIAGNOSIS — R0602 Shortness of breath: Secondary | ICD-10-CM | POA: Diagnosis not present

## 2019-04-03 DIAGNOSIS — Y92239 Unspecified place in hospital as the place of occurrence of the external cause: Secondary | ICD-10-CM | POA: Diagnosis not present

## 2019-04-03 DIAGNOSIS — I2510 Atherosclerotic heart disease of native coronary artery without angina pectoris: Secondary | ICD-10-CM | POA: Diagnosis not present

## 2019-04-03 DIAGNOSIS — E119 Type 2 diabetes mellitus without complications: Secondary | ICD-10-CM

## 2019-04-03 DIAGNOSIS — K922 Gastrointestinal hemorrhage, unspecified: Secondary | ICD-10-CM | POA: Diagnosis not present

## 2019-04-03 DIAGNOSIS — I4811 Longstanding persistent atrial fibrillation: Secondary | ICD-10-CM | POA: Diagnosis not present

## 2019-04-03 DIAGNOSIS — Z951 Presence of aortocoronary bypass graft: Secondary | ICD-10-CM

## 2019-04-03 DIAGNOSIS — Z888 Allergy status to other drugs, medicaments and biological substances status: Secondary | ICD-10-CM

## 2019-04-03 DIAGNOSIS — N181 Chronic kidney disease, stage 1: Secondary | ICD-10-CM | POA: Diagnosis not present

## 2019-04-03 DIAGNOSIS — D508 Other iron deficiency anemias: Secondary | ICD-10-CM | POA: Diagnosis not present

## 2019-04-03 DIAGNOSIS — D631 Anemia in chronic kidney disease: Secondary | ICD-10-CM | POA: Diagnosis not present

## 2019-04-03 DIAGNOSIS — K2951 Unspecified chronic gastritis with bleeding: Secondary | ICD-10-CM | POA: Diagnosis not present

## 2019-04-03 DIAGNOSIS — T502X5A Adverse effect of carbonic-anhydrase inhibitors, benzothiadiazides and other diuretics, initial encounter: Secondary | ICD-10-CM | POA: Diagnosis not present

## 2019-04-03 DIAGNOSIS — Z794 Long term (current) use of insulin: Secondary | ICD-10-CM

## 2019-04-03 DIAGNOSIS — Z8711 Personal history of peptic ulcer disease: Secondary | ICD-10-CM

## 2019-04-03 DIAGNOSIS — K529 Noninfective gastroenteritis and colitis, unspecified: Secondary | ICD-10-CM | POA: Diagnosis present

## 2019-04-03 DIAGNOSIS — R1084 Generalized abdominal pain: Secondary | ICD-10-CM | POA: Diagnosis not present

## 2019-04-03 DIAGNOSIS — D62 Acute posthemorrhagic anemia: Secondary | ICD-10-CM | POA: Diagnosis not present

## 2019-04-03 DIAGNOSIS — D509 Iron deficiency anemia, unspecified: Secondary | ICD-10-CM | POA: Diagnosis present

## 2019-04-03 DIAGNOSIS — I700 Atherosclerosis of aorta: Secondary | ICD-10-CM | POA: Diagnosis present

## 2019-04-03 DIAGNOSIS — N179 Acute kidney failure, unspecified: Secondary | ICD-10-CM | POA: Diagnosis present

## 2019-04-03 DIAGNOSIS — I739 Peripheral vascular disease, unspecified: Secondary | ICD-10-CM | POA: Diagnosis present

## 2019-04-03 DIAGNOSIS — I5033 Acute on chronic diastolic (congestive) heart failure: Secondary | ICD-10-CM | POA: Diagnosis not present

## 2019-04-03 DIAGNOSIS — Z7984 Long term (current) use of oral hypoglycemic drugs: Secondary | ICD-10-CM

## 2019-04-03 DIAGNOSIS — Z952 Presence of prosthetic heart valve: Secondary | ICD-10-CM | POA: Diagnosis not present

## 2019-04-03 DIAGNOSIS — K219 Gastro-esophageal reflux disease without esophagitis: Secondary | ICD-10-CM | POA: Diagnosis present

## 2019-04-03 DIAGNOSIS — E876 Hypokalemia: Secondary | ICD-10-CM | POA: Diagnosis not present

## 2019-04-03 DIAGNOSIS — E1151 Type 2 diabetes mellitus with diabetic peripheral angiopathy without gangrene: Secondary | ICD-10-CM | POA: Diagnosis present

## 2019-04-03 DIAGNOSIS — I509 Heart failure, unspecified: Secondary | ICD-10-CM | POA: Diagnosis not present

## 2019-04-03 DIAGNOSIS — R197 Diarrhea, unspecified: Secondary | ICD-10-CM | POA: Diagnosis not present

## 2019-04-03 DIAGNOSIS — I130 Hypertensive heart and chronic kidney disease with heart failure and stage 1 through stage 4 chronic kidney disease, or unspecified chronic kidney disease: Secondary | ICD-10-CM | POA: Diagnosis not present

## 2019-04-03 DIAGNOSIS — Z1159 Encounter for screening for other viral diseases: Secondary | ICD-10-CM

## 2019-04-03 DIAGNOSIS — I6523 Occlusion and stenosis of bilateral carotid arteries: Secondary | ICD-10-CM | POA: Diagnosis present

## 2019-04-03 DIAGNOSIS — Z7901 Long term (current) use of anticoagulants: Secondary | ICD-10-CM | POA: Diagnosis not present

## 2019-04-03 DIAGNOSIS — Z87891 Personal history of nicotine dependence: Secondary | ICD-10-CM

## 2019-04-03 DIAGNOSIS — E7800 Pure hypercholesterolemia, unspecified: Secondary | ICD-10-CM | POA: Diagnosis present

## 2019-04-03 DIAGNOSIS — E785 Hyperlipidemia, unspecified: Secondary | ICD-10-CM | POA: Diagnosis present

## 2019-04-03 DIAGNOSIS — Z9049 Acquired absence of other specified parts of digestive tract: Secondary | ICD-10-CM

## 2019-04-03 DIAGNOSIS — D72819 Decreased white blood cell count, unspecified: Secondary | ICD-10-CM | POA: Diagnosis present

## 2019-04-03 DIAGNOSIS — Z79899 Other long term (current) drug therapy: Secondary | ICD-10-CM

## 2019-04-03 DIAGNOSIS — I4891 Unspecified atrial fibrillation: Secondary | ICD-10-CM | POA: Diagnosis present

## 2019-04-03 DIAGNOSIS — R195 Other fecal abnormalities: Secondary | ICD-10-CM | POA: Diagnosis not present

## 2019-04-03 DIAGNOSIS — T501X5A Adverse effect of loop [high-ceiling] diuretics, initial encounter: Secondary | ICD-10-CM | POA: Diagnosis not present

## 2019-04-03 DIAGNOSIS — F419 Anxiety disorder, unspecified: Secondary | ICD-10-CM | POA: Diagnosis present

## 2019-04-03 LAB — CBC
HCT: 23.1 % — ABNORMAL LOW (ref 36.0–46.0)
Hemoglobin: 7 g/dL — ABNORMAL LOW (ref 12.0–15.0)
MCH: 30.2 pg (ref 26.0–34.0)
MCHC: 30.3 g/dL (ref 30.0–36.0)
MCV: 99.6 fL (ref 80.0–100.0)
Platelets: 192 10*3/uL (ref 150–400)
RBC: 2.32 MIL/uL — ABNORMAL LOW (ref 3.87–5.11)
RDW: 17.8 % — ABNORMAL HIGH (ref 11.5–15.5)
WBC: 3.1 10*3/uL — ABNORMAL LOW (ref 4.0–10.5)
nRBC: 0 % (ref 0.0–0.2)

## 2019-04-03 LAB — COMPREHENSIVE METABOLIC PANEL
ALT: 14 U/L (ref 0–44)
AST: 24 U/L (ref 15–41)
Albumin: 2.9 g/dL — ABNORMAL LOW (ref 3.5–5.0)
Alkaline Phosphatase: 75 U/L (ref 38–126)
Anion gap: 10 (ref 5–15)
BUN: 36 mg/dL — ABNORMAL HIGH (ref 8–23)
CO2: 19 mmol/L — ABNORMAL LOW (ref 22–32)
Calcium: 8.5 mg/dL — ABNORMAL LOW (ref 8.9–10.3)
Chloride: 103 mmol/L (ref 98–111)
Creatinine, Ser: 1.65 mg/dL — ABNORMAL HIGH (ref 0.44–1.00)
GFR calc Af Amer: 34 mL/min — ABNORMAL LOW (ref >60)
GFR calc non Af Amer: 29 mL/min — ABNORMAL LOW (ref >60)
Glucose, Bld: 148 mg/dL — ABNORMAL HIGH (ref 70–99)
Potassium: 3.6 mmol/L (ref 3.5–5.1)
Sodium: 132 mmol/L — ABNORMAL LOW (ref 135–145)
Total Bilirubin: 1.2 mg/dL (ref 0.3–1.2)
Total Protein: 6.6 g/dL (ref 6.5–8.1)

## 2019-04-03 LAB — SARS CORONAVIRUS 2 BY RT PCR (HOSPITAL ORDER, PERFORMED IN ~~LOC~~ HOSPITAL LAB): SARS Coronavirus 2: NEGATIVE

## 2019-04-03 LAB — GLUCOSE, CAPILLARY: Glucose-Capillary: 86 mg/dL (ref 70–99)

## 2019-04-03 LAB — PROTIME-INR
INR: 2.5 — ABNORMAL HIGH (ref 0.8–1.2)
Prothrombin Time: 26.2 seconds — ABNORMAL HIGH (ref 11.4–15.2)

## 2019-04-03 LAB — POC OCCULT BLOOD, ED: Fecal Occult Bld: NEGATIVE

## 2019-04-03 LAB — PREPARE RBC (CROSSMATCH)

## 2019-04-03 LAB — LIPASE, BLOOD: Lipase: 43 U/L (ref 11–51)

## 2019-04-03 MED ORDER — TADALAFIL 20 MG PO TABS
20.0000 mg | ORAL_TABLET | Freq: Two times a day (BID) | ORAL | Status: DC
  Administered 2019-04-03 – 2019-04-07 (×7): 20 mg via ORAL
  Filled 2019-04-03 (×10): qty 1

## 2019-04-03 MED ORDER — ATORVASTATIN CALCIUM 80 MG PO TABS
80.0000 mg | ORAL_TABLET | Freq: Every day | ORAL | Status: DC
  Administered 2019-04-03 – 2019-04-06 (×4): 80 mg via ORAL
  Filled 2019-04-03 (×4): qty 1

## 2019-04-03 MED ORDER — METOPROLOL TARTRATE 12.5 MG HALF TABLET
12.5000 mg | ORAL_TABLET | Freq: Two times a day (BID) | ORAL | Status: DC
  Administered 2019-04-03 – 2019-04-07 (×8): 12.5 mg via ORAL
  Filled 2019-04-03 (×8): qty 1

## 2019-04-03 MED ORDER — SODIUM CHLORIDE 0.9 % IV SOLN
INTRAVENOUS | Status: DC
  Administered 2019-04-03: 21:00:00 via INTRAVENOUS

## 2019-04-03 MED ORDER — INSULIN ASPART 100 UNIT/ML ~~LOC~~ SOLN
0.0000 [IU] | Freq: Three times a day (TID) | SUBCUTANEOUS | Status: DC
  Administered 2019-04-04: 2 [IU] via SUBCUTANEOUS
  Administered 2019-04-05: 3 [IU] via SUBCUTANEOUS
  Administered 2019-04-05 – 2019-04-06 (×3): 1 [IU] via SUBCUTANEOUS
  Administered 2019-04-06: 2 [IU] via SUBCUTANEOUS

## 2019-04-03 MED ORDER — HYDRALAZINE HCL 50 MG PO TABS
100.0000 mg | ORAL_TABLET | Freq: Three times a day (TID) | ORAL | Status: DC
  Administered 2019-04-03 – 2019-04-07 (×11): 100 mg via ORAL
  Filled 2019-04-03 (×11): qty 2

## 2019-04-03 MED ORDER — ACETAMINOPHEN 325 MG PO TABS
325.0000 mg | ORAL_TABLET | Freq: Four times a day (QID) | ORAL | Status: DC | PRN
  Administered 2019-04-03 – 2019-04-07 (×5): 325 mg via ORAL
  Filled 2019-04-03 (×5): qty 1

## 2019-04-03 MED ORDER — SODIUM CHLORIDE 0.9 % IV SOLN
80.0000 mg | Freq: Two times a day (BID) | INTRAVENOUS | Status: DC
  Filled 2019-04-03 (×2): qty 80

## 2019-04-03 MED ORDER — SPIRONOLACTONE 25 MG PO TABS
25.0000 mg | ORAL_TABLET | Freq: Every day | ORAL | Status: DC
  Administered 2019-04-04 – 2019-04-06 (×3): 25 mg via ORAL
  Filled 2019-04-03: qty 2
  Filled 2019-04-03 (×2): qty 1
  Filled 2019-04-03: qty 2
  Filled 2019-04-03: qty 1

## 2019-04-03 MED ORDER — SODIUM CHLORIDE 0.9 % IV SOLN
80.0000 mg | Freq: Once | INTRAVENOUS | Status: AC
  Administered 2019-04-03: 80 mg via INTRAVENOUS
  Filled 2019-04-03 (×2): qty 80

## 2019-04-03 MED ORDER — ONDANSETRON 4 MG PO TBDP
4.0000 mg | ORAL_TABLET | Freq: Three times a day (TID) | ORAL | Status: DC | PRN
  Administered 2019-04-04 – 2019-04-06 (×2): 4 mg via ORAL
  Filled 2019-04-03 (×2): qty 1

## 2019-04-03 MED ORDER — ALPRAZOLAM 0.25 MG PO TABS
0.2500 mg | ORAL_TABLET | Freq: Every day | ORAL | Status: DC
  Administered 2019-04-03 – 2019-04-06 (×4): 0.25 mg via ORAL
  Filled 2019-04-03 (×4): qty 1

## 2019-04-03 MED ORDER — SODIUM CHLORIDE 0.9 % IV SOLN: 10.0000 mL/h | Freq: Once | INTRAVENOUS | Status: DC

## 2019-04-03 MED ORDER — SODIUM CHLORIDE 0.9 % IV BOLUS: 500.0000 mL | Freq: Once | INTRAVENOUS | Status: DC

## 2019-04-03 NOTE — ED Notes (Signed)
Pt placed on 02 via [place] at 2LPM 

## 2019-04-03 NOTE — ED Notes (Signed)
Admitting MD at bedside.

## 2019-04-03 NOTE — Progress Notes (Signed)
ANTICOAGULATION CONSULT NOTE - Initial Consult  Pharmacy Consult for IV heparin Indication: atrial fibrillation, mechanical aortic valve  Allergies  Allergen Reactions  . Flagyl [Metronidazole] Rash    ALL-OVER BODY RASH  . Coreg [Carvedilol] Other (See Comments)    Terrible cramping in the feet and had a lot of bowel movements, but not diarrhea  . Losartan Swelling    Patient doesn't recall site of swelling  . Verapamil Hives  . Zetia [Ezetimibe] Other (See Comments)    Reaction not recalled  . Zocor [Simvastatin - High Dose] Other (See Comments)    Reaction not recalled    Patient Measurements: Height: 5\' 1"  (154.9 cm) Weight: 125 lb (56.7 kg) IBW/kg (Calculated) : 47.8 Heparin Dosing Weight: 56.7 kg  Vital Signs: Temp: 98.7 F (37.1 C) (06/25 1808) Temp Source: Oral (06/25 1808) BP: 166/39 (06/25 1808) Pulse Rate: 65 (06/25 1808)  Labs: Recent Labs    04/03/19 1456  HGB 7.0*  HCT 23.1*  PLT 192  LABPROT 26.2*  INR 2.5*  CREATININE 1.65*    Estimated Creatinine Clearance: 20.5 mL/min (A) (by C-G formula based on SCr of 1.65 mg/dL (H)).   Medical History: Past Medical History:  Diagnosis Date  . Aortic atherosclerosis ([place]) 01/23/2018  . Atrial fibrillation ([place])   . Carotid artery disease ([place])   . Cholelithiasis 01/23/2018  . Chronic anticoagulation   . Coronary artery disease    status post coronary artery bypass grafting times 07/10/2004  . Diabetes mellitus   . Hypercholesteremia   . Hypertension   . Mechanical heart valve present    H. aortic valve replacement at the time of bypass surgery October 2005  . Moderate to severe pulmonary hypertension ([place])   . Peripheral arterial disease (HCC)    history of left common iliac artery PTA and stenting for a chronic total occlusion 08/26/01    Medications:    . sodium chloride    . sodium chloride      Assessment: 78 yo female on chronic Coumadin for afib and mechanical AVR.  Per Coumadin  Clinic notes, goal INR 2-3.  INR is 2.5 today.  Pt admitted with suspected GIB.  Pharmacy asked to initiate heparin, but will wait until INR </=2.  No vitamin K has been given.  PTA Coumadin dose 7.5 mg daily except 5 mg on Mon, Fri.  Last dose taken today (6/25)  Goal of Therapy:  Heparin level 0.3-0.7 units/ml Monitor platelets by anticoagulation protocol: Yes   Plan:  1. F/u AM INR. 2. Start IV heparin when INR closer to 2.  Marguerite Olea, [facility] Clinical Pharmacist Phone [phone]  04/03/2019 7:15 PM

## 2019-04-03 NOTE — H&P (Signed)
History and Physical:    [name]   TLX:[id number] DOB: Feb 28, 1941 DOA: 04/03/2019  Referring MD/provider: Dr Melina Copa PCP: [name], [name], MD   Patient coming from: Home  Chief Complaint: "I saw blood in my pad today"  History of Present Illness:   [name] is an 78 y.o. female a past medical history significantforPAD s/p stenting; moderate to severe pulmonary HTN; afib and AVR on Coumadin; HTN; HLD; DM; and CAD s/p CABG, and erosive gastritis was in her usual state of health until earlier today when she noted blood in her Depends.  Patient states that she has had chronic diarrhea 4 times a day for 1 year since she had her cholecystectomy.  She describes this as watery.  Last week patient was started on Keflex for a left lower extremity cellulitis.  Patient states that her diarrhea increased after she started the Keflex to up to 6 times a day.  Today patient states she noted blood in her depends.  She is not sure whether it was mixed with the stool or if it was just blood.  She seems to have a hard time describing what she saw other than it looke like blood to her.  Patient is also concerned about ongoing upper abdominal discomfort.  She was seen by GI who started her on pantoprazole however she feels frustrated that she does not feel any better since starting the pantoprazole.  She admits to intermittent nausea but no vomiting.  She says she has some anorexia as well over does try to eat and drink to keep herself hydrated.  View of systems is negative for chest pain, dyspnea on exertion, shortness of breath, fevers or chills.  No real malaise as such.  No headache, dizziness syncope or presyncope.  Chart review reveals from previous note: She underwent a colonoscopy and EGD on 01/22/2018.The colonoscopy showed diverticulosis in the sigmoid colon and descending colon, internal hemorrhoids. The upper endoscopy was normal except for erythematous mucosa in the antrum. She  had a repeat EGD on 11/21/2018 that showed afew minimally oozing( with scope trauma) superficial gastric ulcers with pigmented material were found in the gastric body. The largest lesion was 4 mm in largest dimension.She underwent another upper endoscopy on 02/27/2019 which showed patchy mildly friable mucosa with contact bleeding was found in the gastric fundus, in the gastric body and in the gastric antrum.   ED Course:  The patient was noted to be normotensive, even a little hypertensive.  There was no stool in her vault and the liquid was guaiac negative per ED physician.  Her hemoglobin had decreased from 8.0-7.0 over 2 weeks.  [facility] was consulted and they noted they would see her tomorrow.  She was started with 1 unit PRBC in the ED.  ROS:   ROS   Review of Systems: Per HPI  Past Medical History:   Past Medical History:  Diagnosis Date   Aortic atherosclerosis ([place]) 01/23/2018   Atrial fibrillation (HCC)    Carotid artery disease ([place])    Cholelithiasis 01/23/2018   Chronic anticoagulation    Coronary artery disease    status post coronary artery bypass grafting times 07/10/2004   Diabetes mellitus    Hypercholesteremia    Hypertension    Mechanical heart valve present    H. aortic valve replacement at the time of bypass surgery October 2005   Moderate to severe pulmonary hypertension ([place])    Peripheral arterial disease ([place])  history of left common iliac artery PTA and stenting for a chronic total occlusion 08/26/01    Past Surgical History:   Past Surgical History:  Procedure Laterality Date   AORTIC VALVE REPLACEMENT     CARDIAC CATHETERIZATION  11/10/2004   40% right common illiac, 70% in stent restenosis of distal left common illiac,    CARDIAC CATHETERIZATION  05/18/2004   LAD 50-70% midstenosis, RCA dominant w/50% stenosis, 50% Right common Illiac artery ostial stenosis, 90% in stent restenosis within midportion of left common illiac stent    Carotid Duplex  03/12/2012   RSA-elev. velocities suggestive of a 50-69% diameter reduction, Right&Left Bulb/Prox ICA-mild-mod.fibrous plaqueelevating Velocities abnormal study.   CHOLECYSTECTOMY N/A 03/01/2018   Procedure: LAPAROSCOPIC CHOLECYSTECTOMY;  Surgeon: Judeth Horn, MD;  Location: [place];  Service: General;  Laterality: N/A;   COLONOSCOPY WITH PROPOFOL N/A 01/22/2018   Procedure: COLONOSCOPY WITH PROPOFOL;  Surgeon: Wilford Corner, MD;  Location: [place];  Service: Endoscopy;  Laterality: N/A;   ESOPHAGOGASTRODUODENOSCOPY (EGD) WITH PROPOFOL N/A 01/22/2018   Procedure: ESOPHAGOGASTRODUODENOSCOPY (EGD) WITH PROPOFOL;  Surgeon: Wilford Corner, MD;  Location: [place];  Service: Endoscopy;  Laterality: N/A;   ESOPHAGOGASTRODUODENOSCOPY (EGD) WITH PROPOFOL N/A 11/21/2018   Procedure: ESOPHAGOGASTRODUODENOSCOPY (EGD) WITH PROPOFOL;  Surgeon: [name], MD;  Location: [place];  Service: Gastroenterology;  Laterality: N/A;   ESOPHAGOGASTRODUODENOSCOPY (EGD) WITH PROPOFOL Left 02/27/2019   Procedure: ESOPHAGOGASTRODUODENOSCOPY (EGD) WITH PROPOFOL;  Surgeon: Arta Silence, MD;  [facility] END[facility]COPY;  Service: Endoscopy;  Laterality: Left;   GIVENS CAPSULE STUDY N/A 11/21/2018   Procedure: GIVENS CAPSULE STUDY;  Surgeon: [name], MD;  Location: [place];  Service: Gastroenterology;  Laterality: N/A;  To be deployed during EGD   Lower Ext. Duplex  03/12/2012   Right Proximal CIA- vessel narrowing w/elevated velocities 0-49% diameter reduction. Right SFA-mild mixed density plaque throughout vessel.   NM MYOCAR PERF WALL MOTION  05/19/2010   protocol: Persantine, post stress EF 65%, negative for ischemia, low risk scan   RIGHT HEART CATH N/A 06/27/2018   Procedure: RIGHT HEART CATH;  Surgeon: [name], MD;  Location: [place] [facility];  Service: Cardiovascular;  Laterality: N/A;   TRANSTHORACIC ECHOCARDIOGRAM  08/29/2012   Moderately calcified annulus  of mitral valve, moderate regurg. of both mitral valve and tricuspid valve.     Social History:   Social History   Socioeconomic History   Marital status: Widowed    Spouse name: Not on file   Number of children: Not on file   Years of education: Not on file   Highest education level: Not on file  Occupational History   Occupation: Retired  Scientist, product/process development strain: Not hard at [employer] insecurity    Worry: Never true    Inability: Never true   Transportation needs    Medical: No    Non-medical: No  Tobacco Use   Smoking status: Former Smoker    Packs/day: 1.00    Years: 30.00    Pack years: 30.00    Types: Cigarettes   Smokeless tobacco: Never Used  Substance and Sexual Activity   Alcohol use: No    Alcohol/week: 0.0 standard drinks   Drug use: No   Sexual activity: Not Currently  Lifestyle   Physical activity    Days per week: 0 days    Minutes per session: 0 min   Stress: Only a little  Relationships   Social connections    Talks on  phone: More than three times a week    Gets together: More than three times a week    Attends religious service: Never    Active member of club or organization: No    Attends meetings of clubs or organizations: Never    Relationship status: Widowed   Intimate partner violence    Fear of current or ex partner: No    Emotionally abused: No    Physically abused: No    Forced sexual activity: No  Other Topics Concern   Not on file  Social History Narrative   Not on file    Allergies   Flagyl [metronidazole], Coreg [carvedilol], Losartan, Verapamil, Zetia [ezetimibe], and Zocor [simvastatin - high dose]  Family history:   Family History  Problem Relation Age of Onset   Breast cancer Neg Hx     Current Medications:   Prior to Admission medications   Medication Sig Start Date End Date Taking? Authorizing Provider  acetaminophen (TYLENOL) 325 MG tablet Take 2 tablets (650 mg total)  by mouth every 6 (six) hours as needed for mild pain, fever or headache (for pain or headaches). Patient taking differently: Take 325 mg by mouth every 6 (six) hours as needed for fever or headache (pain).  03/03/18  Yes Rayburn, Floyce Stakes, PA-C  ALPRAZolam (XANAX) 0.5 MG tablet Take 0.25 mg by mouth at bedtime.    Yes [provider]  atorvastatin (LIPITOR) 80 MG tablet Take 1 tablet (80 mg total) by mouth daily at 6 PM. 11/22/18  Yes [name], MD  Cholecalciferol (VITAMIN D3) 1000 UNITS CAPS Take 1,000 Units by mouth daily with lunch.    Yes [provider]  colchicine 0.6 MG tablet Take 0.6 mg by mouth daily as needed (gout attack).   Yes [provider]  Cyanocobalamin (VITAMIN B12) 1000 MCG TBCR Take 1,000 mcg by mouth daily with lunch.    Yes [provider]  furosemide (LASIX) 40 MG tablet Take 80 mg by mouth daily.   Yes [provider]  hydrALAZINE (APRESOLINE) 100 MG tablet TAKE 1 TABLET (100 MG TOTAL) BY MOUTH 3 (THREE) TIMES DAILY. 01/20/19  Yes [name], MD  irbesartan (AVAPRO) 300 MG tablet TAKE 1 TABLET DAILY (PLEASE MAKE APPOINTMENT FOR REFILLS) Patient taking differently: Take 300 mg by mouth daily.  08/06/18  Yes Lorretta Harp, MD  metFORMIN (GLUCOPHAGE) 500 MG tablet Take 500 mg by mouth 2 (two) times daily with a meal.    Yes [provider]  metoprolol tartrate (LOPRESSOR) 25 MG tablet Take 0.5 tablets (12.5 mg total) by mouth 2 (two) times daily. 03/05/19  Yes [place] Eric J, DO  ondansetron (ZOFRAN ODT) 4 MG disintegrating tablet Take 1 tablet (4 mg total) by mouth every 8 (eight) hours as needed for nausea or vomiting. 03/09/19  Yes [name], Kaitlyn E, PA-C  pantoprazole (PROTONIX) 40 MG tablet Take 40 mg by mouth 2 (two) times daily.  12/05/14  Yes [provider]  Probiotic Product (PROBIOTIC PO) Take 1 tablet by mouth daily.   Yes [provider]  spironolactone (ALDACTONE) 25 MG tablet TAKE  ONE-HALF TABLET BY MOUTH DAILY Patient taking differently: Take 25 mg by mouth daily.  03/21/19  Yes [name], MD  tadalafil (ADCIRCA/CIALIS) 20 MG tablet Take 1 tablet (20 mg total) by mouth 2 (two) times daily. 01/02/19  Yes [name], MD  vitamin C (ASCORBIC ACID) 500 MG tablet Take 500 mg by mouth daily with  lunch.   Yes [provider]  warfarin (COUMADIN) 5 MG tablet TAKE 1.5 TO 2 TABLETS BY MOUTH DAILY AS DIRECTED BY COUMADIN CLINIC Patient taking differently: Take 5-7.5 mg by mouth See admin instructions. Take 5 mg by mouth on Monday and Friday morning, take 7.5 mg on Sunday, Tuesday, Wednesday, Thursday, Saturday morning. 03/17/19  Yes Lorretta Harp, MD  cephALEXin (KEFLEX) 500 MG capsule Take 1 capsule (500 mg total) by mouth 4 (four) times daily. Patient not taking: Reported on 04/03/2019 03/22/19   Charlesetta Shanks, MD  ferrous sulfate 325 (65 FE) MG tablet Take 1 tablet (325 mg total) by mouth 2 (two) times daily with a meal. Patient not taking: Reported on 03/22/2019 11/22/18   [name], MD  magnesium oxide (MAG-OX) 400 (241.3 Mg) MG tablet Take 1 tablet (400 mg total) by mouth daily. Patient not taking: Reported on 03/22/2019 01/30/18   [name], MD    Physical Exam:   Vitals:   04/03/19 1615 04/03/19 1745 04/03/19 1749 04/03/19 1808  BP: (!) 144/38 (!) 161/40  (!) 166/39  Pulse: 61 67 65 65  Resp: 20 (!) 22 (!) 22 20  Temp:   98.5 F (36.9 C) 98.7 F (37.1 C)  TempSrc:   Oral Oral  SpO2: 91% 92% 92% 98%  Weight:      Height:         Physical Exam: Blood pressure (!) 166/39, pulse 65, temperature 98.7 F (37.1 C), temperature source Oral, resp. rate 20, height 5\' 1"  (1.549 m), weight 56.7 kg, SpO2 98 %. Gen: Chronically ill-appearing patient lying flat in bed in no acute respiratory distress. Eyes: Sclerae anicteric. Conjunctiva mildly injected. CV: Irregular, holosystolic murmur with mechanical heart sounds. Chest: Reasonable air  entry bilaterally with few rales at bases improved with cough. Abdomen: NABS, soft, nondistended.  Patient has mild tenderness to deep palpation in the epigastric region.  She seems to have some mild diffuse tenderness however this does not reproduce with distraction. Extremities: She has 2-3+ tender edema bilaterally.  She has been treated for cellulitis of left lower extremity and left lower extremity has slight increase in edema compared to the right, no increased erythema or warmth. Neuro: Alert and oriented times 3; grossly nonfocal.   Data Review:    Labs: Basic Metabolic Panel: Recent Labs  Lab 04/03/19 1456  NA 132*  K 3.6  CL 103  CO2 19*  GLUCOSE 148*  BUN 36*  CREATININE 1.65*  CALCIUM 8.5*   Liver Function Tests: Recent Labs  Lab 04/03/19 1456  AST 24  ALT 14  ALKPHOS 75  BILITOT 1.2  PROT 6.6  ALBUMIN 2.9*   Recent Labs  Lab 04/03/19 1456  LIPASE 43   No results for input(s): AMMONIA in the last 168 hours. CBC: Recent Labs  Lab 04/03/19 1456  WBC 3.1*  HGB 7.0*  HCT 23.1*  MCV 99.6  PLT 192   Cardiac Enzymes: No results for input(s): CKTOTAL, CKMB, CKMBINDEX, TROPONINI in the last 168 hours.  BNP (last 3 results) No results for input(s): PROBNP in the last 8760 hours. CBG: No results for input(s): GLUCAP in the last 168 hours.  Urinalysis    Component Value Date/Time   COLORURINE YELLOW 03/09/2019 2119   APPEARANCEUR CLEAR 03/09/2019 2119   LABSPEC 1.006 03/09/2019 2119   PHURINE 7.0 03/09/2019 2119   GLUCOSEU NEGATIVE 03/09/2019 2119   HGBUR MODERATE (A) 03/09/2019 2119   BILIRUBINUR NEGATIVE 03/09/2019 2119   Benjamin Stain  NEGATIVE 03/09/2019 2119   PROTEINUR 30 (A) 03/09/2019 2119   UROBILINOGEN 1.0 10/06/2011 1709   NITRITE NEGATIVE 03/09/2019 2119   LEUKOCYTESUR NEGATIVE 03/09/2019 2119      Radiographic Studies: No results found.  X-rays ordered and pending  EKG: Independently reviewed. Ordered and  pending  Assessment/Plan:   Principal Problem:   GI bleed Active Problems:   Mechanical heart valve present   Chronic anticoagulation   Coronary artery disease   Atrial fibrillation (HCC)   Anemia associated with stage 3 chronic renal failure (HCC)   Diabetes mellitus type 2, controlled (HCC)   S/P cholecystectomy   CKD (chronic kidney disease) stage 3, GFR 30-59 ml/min ([place])   78 year old female with known erosive gastritis who is anticoagulated for mechanical heart valve and atrial fibrillation presents with complaints of blood in her depends.  No stool noted in ED as yet.  However rectal exam was negative for occult blood per ED documentation.  Patient is hemodynamically stable indeed a little hypertensive.  Hemoglobin has decreased from 8 to 7 over the past 20 days.  GI BLEED IN ANTICOAGULATED STATE  Patient states that she noted blood in her depends earlier today however she has no stool in her rectal vault and the liquid is guaiac negative per ED report. Patient is hemodynamically stable indeed a little hypertensive. However her H&H have decreased from 8/25 to 7/23 over a couple of weeks. With known erosive gastritis with friable mucosa from previous EGDs. Will start pantoprazole 80 twice daily IV GI consult has been placed in the ED and [facility] said they would come see her tomorrow. At present she is hemodynamically stable. Getting 1 unit PRBC started in ED. Follow H&H every 6 hours x4.  ANTICOAGULATED STATE She will need to stay on anticoagulation for her mechanical heart valve. Hold warfarin Place patient on IV heparin with pharmacy consultation given INR of 2.5 at present. Will need to be conservative with anticoagulation per pharmacy protocol.    CHRONIC DIARRHEA Patient with chronic diarrhea since she had her cholecystectomy last year. Does say that she has had increased watery diarrhea since she has been put on her Keflex for her left lower extremity cellulitis. Will  request C. difficile on liquid stool.  GASTRITIS As noted above pantoprazole 80 IV every 12 hours [facility] to see patient in the morning.  HTN Patient is hemodynamically stable perhaps a little hypertensive despite history of GI bleed Will order hydralazine and metoprolol however will hold the Lasix and a irbesartan until we have a better understanding of hemodynamics  CKD 3 Creatinine slightly increased from 1.3-1.65, this should improve with transfusion.  DM2 Hold metformin, fingersticks AC at bedtime with sliding scale coverage  HFpEF No evidence for acute decompensation at present I am holding patient's irbesartan as she might be intravascularly fluid depleted secondary to her possible GI bleed.   However I am continuing the hydralazine for some afterload reduction. Continuing her beta-blocker carvedilol and her spironolactone.  CAD Continue atorvastatin and carvedilol She does not appear to be on an antiplatelet agent most likely secondary to known gastritis and previous history of GI bleed  ANXIETY Continue Xanax per home doses  PULMONARY HEART VALVE Continue tadalafil    Other information:   DVT prophylaxis: Print per pharmacy consult.. Code Status: Full code. Family Communication: Patient state no need to call family Disposition Plan: Home Consults called: GI Admission status: Inpatient  The medical decision making on this patient was of high complexity and  the patient is at high risk for clinical deterioration, therefore this is a level 3 visit.   Dewaine Oats Tublu Talecia Sherlin [facility]  If 7PM-7AM, please contact night-coverage [email or web address] Password [facility] 04/03/2019, 6:27 PM

## 2019-04-03 NOTE — ED Notes (Signed)
Pt resting quietly at this time with no complaints voiced

## 2019-04-03 NOTE — ED Triage Notes (Signed)
Pt reports diarrhea and rectal bleeding that started 0500 this morning.

## 2019-04-03 NOTE — ED Notes (Signed)
ED Provider at bedside. 

## 2019-04-03 NOTE — ED Provider Notes (Signed)
[facility] EMERGENCY DEPARTMENT Provider Note   CSN: [id number] Arrival date & time: 04/03/19  1355     History   Chief Complaint Chief Complaint  Patient presents with   Rectal Bleeding   Diarrhea    HPI [name] is a 78 y.o. female.  She has a history of chronic A. fib and is anticoagulated for mechanical heart valve.  She said she has had on and off diarrhea for over a year since her gallbladder surgery but her she started with diarrhea again 3 days ago multiple episodes a day.  She said since yesterday she is also been having blood in the diarrhea.  Been associated with some diffuse abdominal pain.  She has had decreased appetite and says nothing smells or tastes good.  She feels generally weak and she said she has lost 5 pounds.  She said she had a little bit of a cough but no fever.  She has been nauseous but no recent vomiting.     The history is provided by the patient.  Rectal Bleeding Quality:  Unable to specify Timing:  Intermittent Chronicity:  New Context: defecation and diarrhea   Relieved by:  None tried Worsened by:  Nothing Ineffective treatments:  None tried Associated symptoms: abdominal pain and recent illness   Associated symptoms: no fever, no hematemesis, no loss of consciousness and no vomiting   Diarrhea Associated symptoms: abdominal pain   Associated symptoms: no fever, no headaches and no vomiting     Past Medical History:  Diagnosis Date   Aortic atherosclerosis ([place]) 01/23/2018   Atrial fibrillation ([place])    Carotid artery disease ([place])    Cholelithiasis 01/23/2018   Chronic anticoagulation    Coronary artery disease    status post coronary artery bypass grafting times 07/10/2004   Diabetes mellitus    Hypercholesteremia    Hypertension    Mechanical heart valve present    H. aortic valve replacement at the time of bypass surgery October 2005   Moderate to severe pulmonary hypertension ([place])     Peripheral arterial disease ([place])    history of left common iliac artery PTA and stenting for a chronic total occlusion 08/26/01    Patient Active Problem List   Diagnosis Date Noted   Iron deficiency anemia 02/28/2019   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 ([place]) 02/28/2019   CKD (chronic kidney disease) stage 3, GFR 30-59 ml/min ([place]) 02/24/2019   Malnutrition of moderate degree 03/12/2018   Acute on chronic diastolic heart failure ([place]) 03/06/2018   Diabetes mellitus type 2, controlled ([place]) 03/06/2018   Chronic atrial fibrillation 03/06/2018   S/P cholecystectomy 03/06/2018   HTN (hypertension) 03/06/2018   H/O mechanical aortic valve replacement 03/06/2018   Acute hyponatremia 03/06/2018   Acute respiratory failure with hypoxia ([place]) 03/06/2018   Moderate to severe pulmonary hypertension ([place]) 03/06/2018   UTI (urinary tract infection) 02/22/2018   Cholecystitis 02/21/2018   GERD (gastroesophageal reflux disease) 02/21/2018   Anxiety 02/21/2018   Chronic diastolic CHF (congestive heart failure) ([place]) 02/21/2018   Diarrhea 02/21/2018   Rash 02/21/2018   General weakness 02/02/2018   Macular rash 01/25/2018   Thrombocytopenia ([place]) 01/25/2018   Arm pain, diffuse 01/25/2018   Anemia associated with stage 3 chronic renal failure ([place]) 01/23/2018   Cholelithiasis 01/23/2018   Aortic atherosclerosis ([place]) 01/23/2018   GI bleed 01/18/2018   Atrial fibrillation ([place]) 01/12/2016   Coronary artery disease 08/18/2013   Carotid artery disease ([place])  08/18/2013   Peripheral arterial disease ([place]) 08/18/2013   Diabetes mellitus without complication ([place]) 19/14/7829   Benign positional vertigo 10/06/2011   Hyponatremia 10/06/2011   Influenza 10/06/2011   Sinusitis 10/06/2011   Hypertension    Hypercholesteremia    Mechanical heart valve present    Chronic anticoagulation     Past Surgical History:  Procedure  Laterality Date   AORTIC VALVE REPLACEMENT     CARDIAC CATHETERIZATION  11/10/2004   40% right common illiac, 70% in stent restenosis of distal left common illiac,    CARDIAC CATHETERIZATION  05/18/2004   LAD 50-70% midstenosis, RCA dominant w/50% stenosis, 50% Right common Illiac artery ostial stenosis, 90% in stent restenosis within midportion of left common illiac stent   Carotid Duplex  03/12/2012   RSA-elev. velocities suggestive of a 50-69% diameter reduction, Right&Left Bulb/Prox ICA-mild-mod.fibrous plaqueelevating Velocities abnormal study.   CHOLECYSTECTOMY N/A 03/01/2018   Procedure: LAPAROSCOPIC CHOLECYSTECTOMY;  Surgeon: Judeth Horn, MD;  Location: [facility];  Service: General;  Laterality: N/A;   COLONOSCOPY WITH PROPOFOL N/A 01/22/2018   Procedure: COLONOSCOPY WITH PROPOFOL;  Surgeon: Wilford Corner, MD;  Location: [facility];  Service: Endoscopy;  Laterality: N/A;   ESOPHAGOGASTRODUODENOSCOPY (EGD) WITH PROPOFOL N/A 01/22/2018   Procedure: ESOPHAGOGASTRODUODENOSCOPY (EGD) WITH PROPOFOL;  Surgeon: Wilford Corner, MD;  Location: [facility];  Service: Endoscopy;  Laterality: N/A;   ESOPHAGOGASTRODUODENOSCOPY (EGD) WITH PROPOFOL N/A 11/21/2018   Procedure: ESOPHAGOGASTRODUODENOSCOPY (EGD) WITH PROPOFOL;  Surgeon: [name], MD;  Location: [facility];  Service: Gastroenterology;  Laterality: N/A;   ESOPHAGOGASTRODUODENOSCOPY (EGD) WITH PROPOFOL Left 02/27/2019   Procedure: ESOPHAGOGASTRODUODENOSCOPY (EGD) WITH PROPOFOL;  Surgeon: Arta Silence, MD;  [facility]COPY;  Service: Endoscopy;  Laterality: Left;   GIVENS CAPSULE STUDY N/A 11/21/2018   Procedure: GIVENS CAPSULE STUDY;  Surgeon: [name], MD;  Location: [facility];  Service: Gastroenterology;  Laterality: N/A;  To be deployed during EGD   Lower Ext. Duplex  03/12/2012   Right Proximal CIA- vessel narrowing w/elevated velocities 0-49% diameter reduction. Right SFA-mild mixed density plaque  throughout vessel.   NM MYOCAR PERF WALL MOTION  05/19/2010   protocol: Persantine, post stress EF 65%, negative for ischemia, low risk scan   RIGHT HEART CATH N/A 06/27/2018   Procedure: RIGHT HEART CATH;  Surgeon: [name], MD;  Location: [facility] CV LAB;  Service: Cardiovascular;  Laterality: N/A;   TRANSTHORACIC ECHOCARDIOGRAM  08/29/2012   Moderately calcified annulus of mitral valve, moderate regurg. of both mitral valve and tricuspid valve.      OB History   No obstetric history on file.      Home Medications    Prior to Admission medications   Medication Sig Start Date End Date Taking? Authorizing Provider  acetaminophen (TYLENOL) 325 MG tablet Take 2 tablets (650 mg total) by mouth every 6 (six) hours as needed for mild pain, fever or headache (for pain or headaches). Patient taking differently: Take 650 mg by mouth every 6 (six) hours as needed for fever or headache (pain).  03/03/18   Rayburn, Floyce Stakes, PA-C  ALPRAZolam Duanne Moron) 0.5 MG tablet Take 0.25 mg by mouth at bedtime.     [provider]  atorvastatin (LIPITOR) 80 MG tablet Take 1 tablet (80 mg total) by mouth daily at 6 PM. 11/22/18   [name], MD  cephALEXin (KEFLEX) 500 MG capsule Take 1 capsule (500 mg total) by mouth 4 (four) times daily. 03/22/19   Charlesetta Shanks, MD  Cholecalciferol (VITAMIN D3) 1000  UNITS CAPS Take 1,000 Units by mouth daily with lunch.     [provider]  colchicine 0.6 MG tablet Take 0.6 mg by mouth daily as needed (gout attack).    [provider]  Cyanocobalamin (VITAMIN B12) 1000 MCG TBCR Take 1,000 mcg by mouth daily with lunch.     [provider]  ferrous sulfate 325 (65 FE) MG tablet Take 1 tablet (325 mg total) by mouth 2 (two) times daily with a meal. Patient not taking: Reported on 03/22/2019 11/22/18   [name], MD  furosemide (LASIX) 40 MG tablet Take 80 mg by mouth daily.    [provider]  hydrALAZINE  (APRESOLINE) 100 MG tablet TAKE 1 TABLET (100 MG TOTAL) BY MOUTH 3 (THREE) TIMES DAILY. 01/20/19   [name], MD  irbesartan (AVAPRO) 300 MG tablet TAKE 1 TABLET DAILY (PLEASE MAKE APPOINTMENT FOR REFILLS) Patient taking differently: Take 300 mg by mouth daily.  08/06/18   Lorretta Harp, MD  Magnesium 250 MG TABS Take 250 mg by mouth at bedtime.    [provider]  magnesium oxide (MAG-OX) 400 (241.3 Mg) MG tablet Take 1 tablet (400 mg total) by mouth daily. Patient not taking: Reported on 03/22/2019 01/30/18   [name], MD  metFORMIN (GLUCOPHAGE) 500 MG tablet Take 500 mg by mouth 2 (two) times daily with a meal.     [provider]  metoprolol tartrate (LOPRESSOR) 25 MG tablet Take 0.5 tablets (12.5 mg total) by mouth 2 (two) times daily. 03/05/19   [place] Eric J, DO  ondansetron (ZOFRAN ODT) 4 MG disintegrating tablet Take 1 tablet (4 mg total) by mouth every 8 (eight) hours as needed for nausea or vomiting. Patient not taking: Reported on 03/22/2019 03/09/19   [name], Verline Lema E, PA-C  ondansetron (ZOFRAN) 4 MG tablet Take 4 mg by mouth every 8 (eight) hours as needed for nausea or vomiting.  03/22/18   [provider]  pantoprazole (PROTONIX) 40 MG tablet Take 40 mg by mouth 2 (two) times daily.  12/05/14   [provider]  Probiotic Product (PROBIOTIC PO) Take 1 tablet by mouth daily.    [provider]  spironolactone (ALDACTONE) 25 MG tablet TAKE ONE-HALF TABLET BY MOUTH DAILY Patient taking differently: Take 12.5 mg by mouth daily.  03/21/19   [name], MD  tadalafil (ADCIRCA/CIALIS) 20 MG tablet Take 1 tablet (20 mg total) by mouth 2 (two) times daily. 01/02/19   [name], MD  vitamin C (ASCORBIC ACID) 500 MG tablet Take 500 mg by mouth daily with lunch.    [provider]  warfarin (COUMADIN) 5 MG tablet TAKE 1.5 TO 2 TABLETS BY MOUTH DAILY AS DIRECTED BY COUMADIN CLINIC Patient taking differently: Take  5-7.5 mg by mouth See admin instructions. Take 1 tablet (5 mg) by mouth on Monday and Friday morning, take 1 1/2 tablet (7.5 mg) on Sunday, Tuesday, Wednesday, Thursday, Saturday morning. 03/17/19   Lorretta Harp, MD    Family History Family History  Problem Relation Age of Onset   Breast cancer Neg Hx     Social History Social History   Tobacco Use   Smoking status: Former Smoker    Packs/day: 1.00    Years: 30.00    Pack years: 30.00    Types: Cigarettes   Smokeless tobacco: Never Used  Substance Use Topics   Alcohol use: No    Alcohol/week: 0.0 standard drinks   Drug use:  No     Allergies   Flagyl [metronidazole], Coreg [carvedilol], Losartan, Verapamil, Zetia [ezetimibe], and Zocor [simvastatin - high dose]   Review of Systems Review of Systems  Constitutional: Positive for appetite change, fatigue and unexpected weight change. Negative for fever.  HENT: Negative for sore throat.   Eyes: Negative for visual disturbance.  Respiratory: Negative for shortness of breath.   Cardiovascular: Negative for chest pain.  Gastrointestinal: Positive for abdominal pain, blood in stool, diarrhea and hematochezia. Negative for hematemesis and vomiting.  Genitourinary: Negative for dysuria.  Musculoskeletal: Negative for back pain.  Skin: Negative for rash.  Neurological: Negative for loss of consciousness and headaches.     Physical Exam Updated Vital Signs BP (!) 146/42    Pulse 64    Temp 98.7 F (37.1 C) (Oral)    Resp (!) 24    Ht 5\' 1"  (1.549 m)    Wt 56.7 kg    LMP  (Exact Date)    SpO2 94%    BMI 23.62 kg/m   Physical Exam Vitals signs and nursing note reviewed.  Constitutional:      General: She is not in acute distress.    Appearance: She is well-developed.  HENT:     Head: Normocephalic and atraumatic.  Eyes:     Conjunctiva/sclera: Conjunctivae normal.  Neck:     Musculoskeletal: Neck supple.  Cardiovascular:     Rate and Rhythm: Normal rate and  regular rhythm.     Heart sounds: Murmur (with mechanical click) present.  Pulmonary:     Effort: Pulmonary effort is normal. No respiratory distress.     Breath sounds: Normal breath sounds.  Abdominal:     Palpations: Abdomen is soft. There is no mass.     Tenderness: There is abdominal tenderness (diffuse). There is no guarding.  Musculoskeletal: Normal range of motion.        General: Tenderness present.     Right lower leg: Edema present.     Left lower leg: Edema present.  Skin:    General: Skin is warm and dry.     Capillary Refill: Capillary refill takes less than 2 seconds.  Neurological:     General: No focal deficit present.     Mental Status: She is alert and oriented to person, place, and time.      ED Treatments / Results  Labs (all labs ordered are listed, but only abnormal results are displayed) Labs Reviewed  COMPREHENSIVE METABOLIC PANEL - Abnormal; Notable for the following components:      Result Value   Sodium 132 (*)    CO2 19 (*)    Glucose, Bld 148 (*)    BUN 36 (*)    Creatinine, Ser 1.65 (*)    Calcium 8.5 (*)    Albumin 2.9 (*)    GFR calc non Af Amer 29 (*)    GFR calc Af Amer 34 (*)    All other components within normal limits  CBC - Abnormal; Notable for the following components:   WBC 3.1 (*)    RBC 2.32 (*)    Hemoglobin 7.0 (*)    HCT 23.1 (*)    RDW 17.8 (*)    All other components within normal limits  PROTIME-INR - Abnormal; Notable for the following components:   Prothrombin Time 26.2 (*)    INR 2.5 (*)    All other components within normal limits  SARS CORONAVIRUS 2 (HOSPITAL ORDER, [facility])  LIPASE, BLOOD  URINALYSIS, ROUTINE W REFLEX MICROSCOPIC  POC OCCULT BLOOD, ED  TYPE AND SCREEN  PREPARE RBC (CROSSMATCH)    EKG EKG Interpretation  Date/Time:  Thursday April 03 2019 15:55:59 EDT Ventricular Rate:  67 PR Interval:    QRS Duration: 99 QT Interval:  404 QTC Calculation: 427 R  Axis:   111 Text Interpretation:  Sinus rhythm Right axis deviation similar to prior 5/20 Confirmed by Aletta Edouard [phone]) on 04/04/2019 9:33:08 AM   Radiology Dg Chest Port 1 View  Result Date: 04/04/2019 CLINICAL DATA:  Acute respiratory distress EXAM: PORTABLE CHEST 1 VIEW COMPARISON:  S yesterday FINDINGS: Diffuse interstitial opacity with Kerley lines. Trace pleural effusions. There is cardiomegaly history of CABG. Diaphragm flattening attributed to hyperinflation. IMPRESSION: CHF that has progressed from yesterday. Electronically Signed   By: Monte Fantasia M.D.   On: 04/04/2019 05:35   Dg Chest Port 1 View  Result Date: 04/03/2019 CLINICAL DATA:  Shortness of breath EXAM: PORTABLE CHEST 1 VIEW COMPARISON:  02/28/2019, 11/20/2018 FINDINGS: Post sternotomy changes. Cardiomegaly with vascular congestion and diffuse increased hazy and interstitial opacities suspicious for edema. No large effusion. Aortic atherosclerosis. IMPRESSION: Cardiomegaly with vascular congestion and bilateral interstitial and ground-glass opacity suggestive of pulmonary edema. Electronically Signed   By: Donavan Foil M.D.   On: 04/03/2019 19:09    Procedures .Critical Care Performed by: Hayden Rasmussen, MD Authorized by: Hayden Rasmussen, MD   Critical care provider statement:    Critical care time (minutes):  30   Critical care time was exclusive of:  Separately billable procedures and treating other patients   Critical care was necessary to treat or prevent imminent or life-threatening deterioration of the following conditions:  Circulatory failure   Critical care was time spent personally by me on the following activities:  Discussions with consultants, evaluation of patient's response to treatment, examination of patient, ordering and performing treatments and interventions, ordering and review of laboratory studies, ordering and review of radiographic studies, pulse oximetry, re-evaluation of patient's  condition, obtaining history from patient or surrogate, review of old charts and development of treatment plan with patient or surrogate   I assumed direction of critical care for this patient from another provider in my specialty: no     (including critical care time)  Medications Ordered in ED Medications  0.9 %  sodium chloride infusion (has no administration in time range)  acetaminophen (TYLENOL) tablet 325 mg (325 mg Oral Given 04/03/19 2258)  atorvastatin (LIPITOR) tablet 80 mg (80 mg Oral Given 04/03/19 2242)  hydrALAZINE (APRESOLINE) tablet 100 mg (100 mg Oral Given 04/03/19 2242)  metoprolol tartrate (LOPRESSOR) tablet 12.5 mg (12.5 mg Oral Given 04/03/19 2240)  spironolactone (ALDACTONE) tablet 25 mg (has no administration in time range)  tadalafil (CIALIS) tablet 20 mg (20 mg Oral Given 04/03/19 2240)  ALPRAZolam (XANAX) tablet 0.25 mg (0.25 mg Oral Given 04/03/19 2241)  ondansetron (ZOFRAN-ODT) disintegrating tablet 4 mg (has no administration in time range)  insulin aspart (novoLOG) injection 0-9 Units (0 Units Subcutaneous Not Given 04/04/19 0652)  furosemide (LASIX) injection 40 mg (has no administration in time range)  ceFAZolin (ANCEF) IVPB 1 g/50 mL premix (has no administration in time range)  pantoprazole (PROTONIX) injection 40 mg (has no administration in time range)  pantoprazole (PROTONIX) 80 mg in sodium chloride 0.9 % 100 mL IVPB (0 mg Intravenous Stopped 04/04/19 0016)  furosemide (LASIX) injection 60 mg (60 mg Intravenous Given 04/04/19 0509)  furosemide (LASIX) 10  MG/ML injection (  Duplicate 3/61/44 3154)     Initial Impression / Assessment and Plan / ED Course  I have reviewed the triage vital signs and the nursing notes.  Pertinent labs & imaging results that were available during my care of the patient were reviewed by me and considered in my medical decision making (see chart for details).  Clinical Course as of Apr 03 929  Thu Apr 03, 2975  3752  78 year old female on anticoagulation here with diarrhea and rectal bleeding.  She has diffuse abdominal pain.  Her vitals have been okay.  Differential diagnosis includes supratherapeutic coags, GI bleed, diverticulitis, diarrhea infectious, anemia, peptic ulcer disease.  She is getting labs and Covid testing.  She follows with [facility]   [MB]  [id number] Rectal exam done with tech as chaperone.  Normal sphincter tone no masses sample sent for guaiac.  Her vault was empty and there was nothing.    [MB]  1709 Discussed with GI Dr. Oletta Lamas from [facility].  He will have somebody from the team see her tomorrow for recommendations.   [MB]  1804 Discussed with Dr. Jamse Arn from the hospitalist service who will evaluate the patient for admission.   [MB]  1920 ECG is normal sinus rhythm rate of 67 right axis deviation no acute ST-T changes.   [MB]    Clinical Course User Index [MB] Hayden Rasmussen, MD   BRAYLINN GULDEN was evaluated in Emergency Department on 04/03/2019 for the symptoms described in the history of present illness. She was evaluated in the context of the global COVID-19 pandemic, which necessitated consideration that the patient might be at risk for infection with the SARS-CoV-2 virus that causes COVID-19. Institutional protocols and algorithms that pertain to the evaluation of patients at risk for COVID-19 are in a state of rapid change based on information released by regulatory bodies including the CDC and federal and state organizations. These policies and algorithms were followed during the patient's care in the ED.      Final Clinical Impressions(s) / ED Diagnoses   Final diagnoses:  Gastrointestinal hemorrhage, unspecified gastrointestinal hemorrhage type  Diarrhea, unspecified type  Symptomatic anemia    ED Discharge Orders    None       Hayden Rasmussen, MD 04/04/19 [phone]

## 2019-04-04 ENCOUNTER — Inpatient Hospital Stay (HOSPITAL_COMMUNITY): Payer: Medicare HMO

## 2019-04-04 DIAGNOSIS — D649 Anemia, unspecified: Secondary | ICD-10-CM

## 2019-04-04 DIAGNOSIS — R0603 Acute respiratory distress: Secondary | ICD-10-CM

## 2019-04-04 DIAGNOSIS — K922 Gastrointestinal hemorrhage, unspecified: Secondary | ICD-10-CM

## 2019-04-04 DIAGNOSIS — Z9049 Acquired absence of other specified parts of digestive tract: Secondary | ICD-10-CM

## 2019-04-04 LAB — BLOOD GAS, ARTERIAL
Acid-base deficit: 4.7 mmol/L — ABNORMAL HIGH (ref 0.0–2.0)
Bicarbonate: 19.1 mmol/L — ABNORMAL LOW (ref 20.0–28.0)
Delivery systems: POSITIVE
Drawn by: 347191
Expiratory PAP: 6
FIO2: 40
Inspiratory PAP: 12
O2 Saturation: 98 %
Patient temperature: 97.8
pCO2 arterial: 30.1 mmHg — ABNORMAL LOW (ref 32.0–48.0)
pH, Arterial: 7.417 (ref 7.350–7.450)
pO2, Arterial: 97.5 mmHg (ref 83.0–108.0)

## 2019-04-04 LAB — C DIFFICILE QUICK SCREEN W PCR REFLEX
C Diff antigen: NEGATIVE
C Diff interpretation: NOT DETECTED
C Diff toxin: NEGATIVE

## 2019-04-04 LAB — TYPE AND SCREEN
ABO/RH(D): A POS
Antibody Screen: NEGATIVE
Unit division: 0

## 2019-04-04 LAB — PROTIME-INR
INR: 2.7 — ABNORMAL HIGH (ref 0.8–1.2)
INR: 2.9 — ABNORMAL HIGH (ref 0.8–1.2)
Prothrombin Time: 27.9 seconds — ABNORMAL HIGH (ref 11.4–15.2)
Prothrombin Time: 29.5 seconds — ABNORMAL HIGH (ref 11.4–15.2)

## 2019-04-04 LAB — BPAM RBC
Blood Product Expiration Date: 202007162359
ISSUE DATE / TIME: 202006251740
Unit Type and Rh: 6200

## 2019-04-04 LAB — GLUCOSE, CAPILLARY
Glucose-Capillary: 144 mg/dL — ABNORMAL HIGH (ref 70–99)
Glucose-Capillary: 171 mg/dL — ABNORMAL HIGH (ref 70–99)
Glucose-Capillary: 169 mg/dL — ABNORMAL HIGH (ref 70–99)
Glucose-Capillary: 89 mg/dL (ref 70–99)
Glucose-Capillary: 126 mg/dL — ABNORMAL HIGH (ref 70–99)

## 2019-04-04 LAB — HEMOGLOBIN AND HEMATOCRIT, BLOOD
HCT: 22.8 % — ABNORMAL LOW (ref 36.0–46.0)
Hemoglobin: 7.3 g/dL — ABNORMAL LOW (ref 12.0–15.0)

## 2019-04-04 LAB — BASIC METABOLIC PANEL
Anion gap: 11 (ref 5–15)
BUN: 30 mg/dL — ABNORMAL HIGH (ref 8–23)
CO2: 20 mmol/L — ABNORMAL LOW (ref 22–32)
Calcium: 8.5 mg/dL — ABNORMAL LOW (ref 8.9–10.3)
Chloride: 105 mmol/L (ref 98–111)
Creatinine, Ser: 1.46 mg/dL — ABNORMAL HIGH (ref 0.44–1.00)
GFR calc Af Amer: 39 mL/min — ABNORMAL LOW (ref >60)
GFR calc non Af Amer: 34 mL/min — ABNORMAL LOW (ref >60)
Glucose, Bld: 126 mg/dL — ABNORMAL HIGH (ref 70–99)
Potassium: 3.9 mmol/L (ref 3.5–5.1)
Sodium: 136 mmol/L (ref 135–145)

## 2019-04-04 LAB — CBC
HCT: 23.9 % — ABNORMAL LOW (ref 36.0–46.0)
Hemoglobin: 7.8 g/dL — ABNORMAL LOW (ref 12.0–15.0)
MCH: 30.7 pg (ref 26.0–34.0)
MCHC: 32.6 g/dL (ref 30.0–36.0)
MCV: 94.1 fL (ref 80.0–100.0)
Platelets: 176 10*3/uL (ref 150–400)
RBC: 2.54 MIL/uL — ABNORMAL LOW (ref 3.87–5.11)
RDW: 17.2 % — ABNORMAL HIGH (ref 11.5–15.5)
WBC: 3.6 10*3/uL — ABNORMAL LOW (ref 4.0–10.5)
nRBC: 0 % (ref 0.0–0.2)

## 2019-04-04 LAB — APTT: aPTT: 65 seconds — ABNORMAL HIGH (ref 24–36)

## 2019-04-04 MED ORDER — FUROSEMIDE 10 MG/ML IJ SOLN
60.0000 mg | Freq: Once | INTRAMUSCULAR | Status: AC
  Administered 2019-04-04: 60 mg via INTRAVENOUS

## 2019-04-04 MED ORDER — RISAQUAD PO CAPS
1.0000 | ORAL_CAPSULE | Freq: Every day | ORAL | Status: DC
  Administered 2019-04-04 – 2019-04-07 (×4): 1 via ORAL
  Filled 2019-04-04 (×4): qty 1

## 2019-04-04 MED ORDER — IPRATROPIUM-ALBUTEROL 0.5-2.5 (3) MG/3ML IN SOLN: 3.0000 mL | RESPIRATORY_TRACT | Status: DC | PRN

## 2019-04-04 MED ORDER — CEFAZOLIN SODIUM-DEXTROSE 1-4 GM/50ML-% IV SOLN
1.0000 g | Freq: Two times a day (BID) | INTRAVENOUS | Status: DC
  Administered 2019-04-04 – 2019-04-07 (×7): 1 g via INTRAVENOUS
  Filled 2019-04-04 (×9): qty 50

## 2019-04-04 MED ORDER — FUROSEMIDE 10 MG/ML IJ SOLN
40.0000 mg | Freq: Two times a day (BID) | INTRAMUSCULAR | Status: DC
  Administered 2019-04-04 – 2019-04-06 (×4): 40 mg via INTRAVENOUS
  Filled 2019-04-04 (×4): qty 4

## 2019-04-04 MED ORDER — PROMETHAZINE HCL 25 MG/ML IJ SOLN
12.5000 mg | Freq: Once | INTRAMUSCULAR | Status: AC
  Administered 2019-04-04: 12.5 mg via INTRAVENOUS
  Filled 2019-04-04: qty 1

## 2019-04-04 MED ORDER — FUROSEMIDE 10 MG/ML IJ SOLN
INTRAMUSCULAR | Status: AC
  Filled 2019-04-04: qty 6

## 2019-04-04 MED ORDER — PANTOPRAZOLE SODIUM 40 MG IV SOLR
40.0000 mg | Freq: Two times a day (BID) | INTRAVENOUS | Status: DC
  Administered 2019-04-04 – 2019-04-07 (×7): 40 mg via INTRAVENOUS
  Filled 2019-04-04 (×7): qty 40

## 2019-04-04 MED ORDER — FUROSEMIDE 10 MG/ML IJ SOLN
40.0000 mg | Freq: Once | INTRAMUSCULAR | Status: AC
  Administered 2019-04-04: 40 mg via INTRAVENOUS
  Filled 2019-04-04: qty 4

## 2019-04-04 NOTE — Progress Notes (Signed)
Patient got up and used the bedside commode.  Patient felt sob and oxygen sat 84% not improving.  Put back on oxygen 2L and sat go up to 97%

## 2019-04-04 NOTE — Progress Notes (Signed)
PROGRESS NOTE    [name]   ZOX:[id number]  DOB: 1941/02/02  DOA: 04/03/2019 PCP: [name], MD   Brief Narrative:  [name] is an 78 y.o. female a past medical history significantforPAD  moderate to severe pulmonary HTN; PAF,  Mechanical AVR on Coumadin; HTN; HLD; DM; and CAD s/p CABG, and erosive gastritis.  She present for blood noted on her depends. She began to have diarrhea after she was started on Keflex for cellulitis and has thus been wearing depends.  She has also been taking Protonix for abdominal pain started by GI. She continues to have nausea and some anorexia.    Subjective: On BiPAP this AM. Breathing is better. No abdominal pain. No further diarrhea since yesterday. No vomiting. No other complaints.     Assessment & Plan:   Principal Problem:   Acute GI bleed- h/o PUD - Prior EGDs have shown gastric ulcers- on BID PPI - FOB neg in ED- ? If hemorrhoidal bleed related to diarrhea  - Last EGD on 02/27/19> patchy mildly friable mucosa with contact bleeding was found in the gastric fundus, in the gastric body and in the gastric antrum. - Last Colonoscopy, 01/22/18 > diverticulosis and internal hemorrhoids - no further diarrhea since yesterday and no further bleeding for now  Active Problems: Anemia due to acute blood loss Chronic anemia due to chronic inflammation, hemolysis (mechanical valve), CKD & Iron deficiency - baseline Hb ~ 8 - Hb 7 on admission for which she received 1 u PRBC- Hb 7.8 - follows with Dr Jana Hakim as outpt and receives Aranesp, Iron infusions- last Iron infusion 6/11 - following  Acute respiratory failure due to acute on diastolic CHF- requiring BiPAP - last ECHO from 02/24/18 reveals grade 3 diastolic CHF - fluid overload liked occurred due to blood transfusion - wean off BiPAP   - give a second dose of Lasix IV now and follow output and pulse ox - cont Aldactone which she takes as outpt  Cellulitis left leg -  continues to have cellulitis - start Ancef and follow  Essential HTN - on Hydralazine, Metoprolol, Aldactone - Avapro on hold as Cr was elevated on admission     Mechanical aortic heart valve present - INR therapeutic- Goal 2.5- 3.5 -- Coumadin on hold & pharmacy consulted to start Heparin once INR comes down   Coronary artery disease s/p CABG PAD Bilateral (moderate) carotid stenosis - on Lipitor and Coumadin  Pulm HTN - cont Tadalafil   Paroxysmal Atrial fibrillation   - Coumadin on hold - cont Metoprolol   AKI on CKD 3 - improving- cont to follow    Diabetes mellitus type 2, controlled  - Holding Metformin - cont SSI  Possibly rheumatoid arthritis - plans for f/u with rheumatologist     Time spent in minutes: 40 min DVT prophylaxis: INR therapeutic  Code Status: Full code Family Communication:   Disposition Plan: follow in SDU- f/u on GI bleed Consultants:   GI consulted by ED Procedures:   none Antimicrobials:  Anti-infectives (From admission, onward)   Start     Dose/Rate Route Frequency Ordered Stop   04/04/19 0830  ceFAZolin (ANCEF) IVPB 1 g/50 mL premix     1 g 100 mL/hr over 30 Minutes Intravenous Every 12 hours 04/04/19 0811         Objective: Vitals:   04/04/19 0505 04/04/19 0600 04/04/19 0800 04/04/19 0813  BP: (!) 185/46 (!) 158/43 (!) 170/45   Pulse: 89  72 85 76  Resp: (!) 35 (!) 26 (!) 27 (!) 24  Temp:      TempSrc:      SpO2:  98% 99% 100%  Weight:      Height:        Intake/Output Summary (Last 24 hours) at 04/04/2019 1001 Last data filed at 04/04/2019 0016 Gross per 24 hour  Intake 915.2 ml  Output -  Net 915.2 ml   Filed Weights   04/03/19 1406 04/03/19 2000  Weight: 56.7 kg 57.6 kg    Examination: General exam: Appears comfortable  HEENT: PERRLA, oral mucosa moist, no sclera icterus or thrush Respiratory system: faint crackles at bases- Respiratory effort normal. Cardiovascular system: S1 & S2 heard, RRR.    Gastrointestinal system: Abdomen soft, non-tender, nondistended. Normal bowel sounds. Central nervous system: Alert and oriented. No focal neurological deficits. Extremities: No cyanosis, clubbing - mild edema of left leg with erythema and tenderness Skin: No rashes or ulcers Psychiatry:  Mood & affect appropriate.     Data Reviewed: I have personally reviewed following labs and imaging studies  CBC: Recent Labs  Lab 04/03/19 1456 04/04/19 0245 04/04/19 0821  WBC 3.1*  --  3.6*  HGB 7.0* 7.3* 7.8*  HCT 23.1* 22.8* 23.9*  MCV 99.6  --  94.1  PLT 192  --  536   Basic Metabolic Panel: Recent Labs  Lab 04/03/19 1456 04/04/19 0821  NA 132* 136  K 3.6 3.9  CL 103 105  CO2 19* 20*  GLUCOSE 148* 126*  BUN 36* 30*  CREATININE 1.65* 1.46*  CALCIUM 8.5* 8.5*   GFR: Estimated Creatinine Clearance: 25.1 mL/min (A) (by C-G formula based on SCr of 1.46 mg/dL (H)). Liver Function Tests: Recent Labs  Lab 04/03/19 1456  AST 24  ALT 14  ALKPHOS 75  BILITOT 1.2  PROT 6.6  ALBUMIN 2.9*   Recent Labs  Lab 04/03/19 1456  LIPASE 43   No results for input(s): AMMONIA in the last 168 hours. Coagulation Profile: Recent Labs  Lab 04/03/19 1456 04/04/19 0245 04/04/19 0821  INR 2.5* 2.7* 2.9*   Cardiac Enzymes: No results for input(s): CKTOTAL, CKMB, CKMBINDEX, TROPONINI in the last 168 hours. BNP (last 3 results) No results for input(s): PROBNP in the last 8760 hours. HbA1C: No results for input(s): HGBA1C in the last 72 hours. CBG: Recent Labs  Lab 04/03/19 2231 04/04/19 0636  GLUCAP 86 144*   Lipid Profile: No results for input(s): CHOL, HDL, LDLCALC, TRIG, CHOLHDL, LDLDIRECT in the last 72 hours. Thyroid Function Tests: No results for input(s): TSH, T4TOTAL, FREET4, T3FREE, THYROIDAB in the last 72 hours. Anemia Panel: No results for input(s): VITAMINB12, FOLATE, FERRITIN, TIBC, IRON, RETICCTPCT in the last 72 hours. Urine analysis:    Component Value  Date/Time   COLORURINE YELLOW 03/09/2019 2119   APPEARANCEUR CLEAR 03/09/2019 2119   LABSPEC 1.006 03/09/2019 2119   PHURINE 7.0 03/09/2019 2119   GLUCOSEU NEGATIVE 03/09/2019 2119   HGBUR MODERATE (A) 03/09/2019 2119   [place] NEGATIVE 03/09/2019 2119   [place] NEGATIVE 03/09/2019 2119   PROTEINUR 30 (A) 03/09/2019 2119   UROBILINOGEN 1.0 10/06/2011 1709   NITRITE NEGATIVE 03/09/2019 2119   LEUKOCYTESUR NEGATIVE 03/09/2019 2119   Sepsis Labs: @LABRCNTIP (procalcitonin:4,lacticidven:4) ) Recent Results (from the past 240 hour(s))  SARS Coronavirus 2 (CEPHEID - Performed in [facility] hospital lab), Hosp Order     Status: None   Collection Time: 04/03/19  3:51 PM   Specimen: Nasopharyngeal Swab  Result Value Ref Range Status   SARS Coronavirus 2 NEGATIVE NEGATIVE Final    Comment: (NOTE) If result is NEGATIVE SARS-CoV-2 target nucleic acids are NOT DETECTED. The SARS-CoV-2 RNA is generally detectable in upper and lower  respiratory specimens during the acute phase of infection. The lowest  concentration of SARS-CoV-2 viral copies this assay can detect is 250  copies / mL. A negative result does not preclude SARS-CoV-2 infection  and should not be used as the sole basis for treatment or other  patient management decisions.  A negative result may occur with  improper specimen collection / handling, submission of specimen other  than nasopharyngeal swab, presence of viral mutation(s) within the  areas targeted by this assay, and inadequate number of viral copies  (<250 copies / mL). A negative result must be combined with clinical  observations, patient history, and epidemiological information. If result is POSITIVE SARS-CoV-2 target nucleic acids are DETECTED. The SARS-CoV-2 RNA is generally detectable in upper and lower  respiratory specimens dur ing the acute phase of infection.  Positive  results are indicative of active infection with SARS-CoV-2.  Clinical   correlation with patient history and other diagnostic information is  necessary to determine patient infection status.  Positive results do  not rule out bacterial infection or co-infection with other viruses. If result is PRESUMPTIVE POSTIVE SARS-CoV-2 nucleic acids MAY BE PRESENT.   A presumptive positive result was obtained on the submitted specimen  and confirmed on repeat testing.  While 2019 novel coronavirus  (SARS-CoV-2) nucleic acids may be present in the submitted sample  additional confirmatory testing may be necessary for epidemiological  and / or clinical management purposes  to differentiate between  SARS-CoV-2 and other Sarbecovirus currently known to infect humans.  If clinically indicated additional testing with an alternate test  methodology [phone]) is advised. The SARS-CoV-2 RNA is generally  detectable in upper and lower respiratory sp ecimens during the acute  phase of infection. The expected result is Negative. Fact Sheet for Patients:  StrictlyIdeas.no Fact Sheet for Healthcare Providers: BankingDealers.co.za This test is not yet approved or cleared by the [place] FDA and has been authorized for detection and/or diagnosis of SARS-CoV-2 by FDA under an Emergency Use Authorization (EUA).  This EUA will remain in effect (meaning this test can be used) for the duration of the COVID-19 declaration under Section 564(b)(1) of the Act, 21 U.S.C. section 360bbb-3(b)(1), unless the authorization is terminated or revoked sooner. Performed at [facility], [place] [address], [place], [place] [address]          Radiology Studies: Dg Chest Port 1 View  Result Date: 04/04/2019 CLINICAL DATA:  Acute respiratory distress EXAM: PORTABLE CHEST 1 VIEW COMPARISON:  S yesterday FINDINGS: Diffuse interstitial opacity with Kerley lines. Trace pleural effusions. There is cardiomegaly history of CABG. Diaphragm flattening  attributed to hyperinflation. IMPRESSION: CHF that has progressed from yesterday. Electronically Signed   By: Monte Fantasia M.D.   On: 04/04/2019 05:35   Dg Chest Port 1 View  Result Date: 04/03/2019 CLINICAL DATA:  Shortness of breath EXAM: PORTABLE CHEST 1 VIEW COMPARISON:  02/28/2019, 11/20/2018 FINDINGS: Post sternotomy changes. Cardiomegaly with vascular congestion and diffuse increased hazy and interstitial opacities suspicious for edema. No large effusion. Aortic atherosclerosis. IMPRESSION: Cardiomegaly with vascular congestion and bilateral interstitial and ground-glass opacity suggestive of pulmonary edema. Electronically Signed   By: Donavan Foil M.D.   On: 04/03/2019 19:09      Scheduled Meds: .  ALPRAZolam  0.25 mg Oral QHS  . atorvastatin  80 mg Oral q1800  . furosemide  40 mg Intravenous Once  . hydrALAZINE  100 mg Oral TID  . insulin aspart  0-9 Units Subcutaneous TID WC  . metoprolol tartrate  12.5 mg Oral BID  . pantoprazole (PROTONIX) IV  40 mg Intravenous Q12H  . spironolactone  25 mg Oral Daily  . tadalafil  20 mg Oral BID   Continuous Infusions: . sodium chloride    .  ceFAZolin (ANCEF) IV       LOS: 1 day      [name], MD [facility] Pager: [email or web address] Password [email or web address] 04/04/2019, 10:01 AM

## 2019-04-04 NOTE — Progress Notes (Signed)
RT obtained ABG on pt with the following results. No changes at this time. RT will continue to monitor.   Results for [name], [name] (MRN [id number]) as of 04/04/2019 05:39  Ref. Range 04/04/2019 05:27  pH, Arterial Latest Ref Range: 7.350 - 7.450  7.417  pCO2 arterial Latest Ref Range: 32.0 - 48.0 mmHg 30.1 (L)  pO2, Arterial Latest Ref Range: 83.0 - 108.0 mmHg 97.5  Acid-base deficit Latest Ref Range: 0.0 - 2.0 mmol/L 4.7 (H)  Bicarbonate Latest Ref Range: 20.0 - 28.0 mmol/L 19.1 (L)  O2 Saturation Latest Units: % 98.0  Patient temperature Unknown 97.8  Collection site Unknown LEFT RADIAL  Allens test (pass/fail) Latest Ref Range: PASS  PASS

## 2019-04-04 NOTE — Progress Notes (Signed)
COURTESY NOTE:  We follow [name] for her iron deficiency. She has received feraheme recently and should not require further iron supplementation at this point. She has follow-up with [place] 05/05/2019 and we will monitor her iron stores chronically and supplement as needed. Summary as below. Please let our service know if we can be of help.  ASSESSMENT: 78 y.o.  woman with multifactorial anemia, as follows: (a) anemia of chronic inflammation: As of May 2020 she has a SED rate of 95, CRP 1.1, positive ANA and RF; she has been referred to rheumatology with an appointment pending 03/27/2019 inflammation makes iron stores relatively inaccessible for hematopoiesis and blunts the effect of native EPO (b) hemolysis (mild): she has a mildly elevated LDH and a DAT positive for complement, not IgG; this is c/w (a) above (c) anemia of renal insufficiency: inadequate EPO resonse to anemia(d) occult      (d) GIB/ iron deficiency in patient on lifelong anticoagulaton  (1) Feraheme received 03/01/2019, repeated 03/20/2019              (a) reticulocyte 03/07/2019 up from 43.4 a year ago to 191.8 after iron infusion  (2) darbepoetin: Considered starting 03/20/2019 and repeat every 28 days; however given her excellent reticulocyte response to iron replacement without EPO, we will likely hold on darbepoetin for now  (3) evaluation by rheumatology pending

## 2019-04-04 NOTE — Progress Notes (Signed)
Pt was taken off of BIPAP and placed on 4 L [place] per MD. Pt is stable at this time. RN aware.

## 2019-04-04 NOTE — Consult Note (Addendum)
   [facility] Inpatient Consult   04/04/2019  [name] [birth year] [id number]  Addendum:  Patient with extreme score for unplanned readmission in less than 30 days noted.  Patient is currently active with [facility] for chronic disease management services.  Patient has been engaged by a [facility] [facility] Management Coordinator.  Our community based plan of care has focused on disease management and community resource support.   Patient's chart reviewed and below is notes from her History and Physical from 04/03/2019:  [name] is an 78 y.o. female a past medical history significantforPAD s/p stenting; moderate to severe pulmonary HTN; afib and AVR on Coumadin; HTN; HLD; DM; and CAD s/p CABG, and erosive gastritis was in her usual state of health until earlier today when she noted blood in her Depends.  Patient states that she has had chronic diarrhea 4 times a day for 1 year since she had her cholecystectomy.  She describes this as watery.  Last week patient was started on Keflex for a left lower extremity cellulitis.  Patient states that her diarrhea increased after she started the Keflex to up to 6 times a day.    Inpatient TOC team member was notified to make aware that [facility] following.  Plan:  Follow up with her transition from the hospital and for post hospital care management needs.   Of note, [facility] Care Management services does not replace or interfere with any services that are needed or arranged by inpatient case management or social work.  For additional questions or referrals please contact:  Natividad Brood, RN BSN [facility] Liaison  [phone] business mobile phone Toll free office [phone]  Fax number: [phone] [email or web address]

## 2019-04-04 NOTE — Progress Notes (Signed)
[place] for IV heparin Indication: atrial fibrillation, mechanical aortic valve  Allergies  Allergen Reactions  . Flagyl [Metronidazole] Rash    ALL-OVER BODY RASH  . Coreg [Carvedilol] Other (See Comments)    Terrible cramping in the feet and had a lot of bowel movements, but not diarrhea  . Losartan Swelling    Patient doesn't recall site of swelling  . Verapamil Hives  . Zetia [Ezetimibe] Other (See Comments)    Reaction not recalled  . Zocor [Simvastatin - High Dose] Other (See Comments)    Reaction not recalled    Patient Measurements: Height: 5\' 1"  (154.9 cm) Weight: 126 lb 15.8 oz (57.6 kg) IBW/kg (Calculated) : 47.8 Heparin Dosing Weight: 56.7 kg  Vital Signs: Temp: 97.8 F (36.6 C) (06/26 0431) Temp Source: Oral (06/26 0431) BP: 170/45 (06/26 0800) Pulse Rate: 76 (06/26 0813)  Labs: Recent Labs    04/03/19 1456 04/04/19 0245 04/04/19 0821  HGB 7.0* 7.3* 7.8*  HCT 23.1* 22.8* 23.9*  PLT 192  --  176  APTT  --   --  65*  LABPROT 26.2* 27.9* 29.5*  INR 2.5* 2.7* 2.9*  CREATININE 1.65*  --  1.46*    Estimated Creatinine Clearance: 25.1 mL/min (A) (by C-G formula based on SCr of 1.46 mg/dL (H)).   Medical History: Past Medical History:  Diagnosis Date  . Aortic atherosclerosis ([place]) 01/23/2018  . Atrial fibrillation ([place])   . Carotid artery disease ([place])   . Cholelithiasis 01/23/2018  . Chronic anticoagulation   . Coronary artery disease    status post coronary artery bypass grafting times 07/10/2004  . Diabetes mellitus   . Hypercholesteremia   . Hypertension   . Mechanical heart valve present    H. aortic valve replacement at the time of bypass surgery October 2005  . Moderate to severe pulmonary hypertension ([place])   . Peripheral arterial disease ([facility])    history of left common iliac artery PTA and stenting for a chronic total occlusion 08/26/01    Medications:  . ALPRAZolam  0.25 mg Oral QHS  .  atorvastatin  80 mg Oral q1800  . furosemide  40 mg Intravenous BID  . hydrALAZINE  100 mg Oral TID  . insulin aspart  0-9 Units Subcutaneous TID WC  . metoprolol tartrate  12.5 mg Oral BID  . pantoprazole (PROTONIX) IV  40 mg Intravenous Q12H  . spironolactone  25 mg Oral Daily  . tadalafil  20 mg Oral BID   . sodium chloride    .  ceFAZolin (ANCEF) IV 1 g (04/04/19 1025)    Assessment: 78 yo female on chronic Coumadin for afib and mechanical AVR.  Per Coumadin Clinic notes, goal INR 2-3 (goal was changed from 2.5-3.5 to 2-3 in 03/2018). Pt admitted with suspected GIB.  Pharmacy asked to initiate heparin. -INR= 2.9   PTA Coumadin dose 7.5 mg daily except 5 mg on Mon, Fri.  Last dose taken today (6/25)  Goal of Therapy:  Heparin level 0.3-0.7 units/ml Monitor platelets by anticoagulation protocol: Yes   Plan:  -Daily INR - Start IV heparin when INR closer to 2.  Hildred Laser, PharmD Clinical Pharmacist **Pharmacist phone directory can now be found on [email or web address] (PW [email or web address]).  Listed under [place].

## 2019-04-04 NOTE — Progress Notes (Addendum)
  Pt complained having sudden severe headache, anxious, restless and SOB, showed sign of respiratory distress with SPO2 84% , she was on 2 LPM, RR 30-40, HR 102 with sinus tachycardia on monitor with severe hypertensive. Called RRT, evaluated with possibly CHF. Order received, action taking at at bedside.Please look at RRT note.   Eusebio Me, PCCN-CMC,CSC

## 2019-04-04 NOTE — Progress Notes (Signed)
Pt is transferred from ED with a unit of PRBC infusing. Pt is alert and oriented, no acute distress is seen on arrival. She is on 2 LPM of O2 NCL with out respiratory distress noted.  CHG bath given, CCMD was called with 2nd person verified, her EKG is sinus rhythm on monitor. Room and equipments instruction given. Call bell with in reach. Will continue to monitor.  Kennyth Lose, RN

## 2019-04-04 NOTE — Consult Note (Signed)
[facility] Consultation Note  Referring Provider: Dr. Wynelle Cleveland [facility]) Primary Care Physician:  [name], [name], MD  Reason for Consultation:  Anemia, hematochezia  HPI: [name] is a 78 y.o. female admitted with worsening anemia and isolated episode of hematochezia.  No nausea, vomiting, hematemesis, change in bowel habits.  Has had issues with anemia for over the past one year, and has had extensive investigation, as summarized in assessment/plan below.  Is on anticoagulation, warfarin, for mechanoprosthetic heart valve replacement.   Past Medical History:  Diagnosis Date  . Aortic atherosclerosis ([place]) 01/23/2018  . Atrial fibrillation ([place])   . Carotid artery disease ([place])   . Cholelithiasis 01/23/2018  . Chronic anticoagulation   . Coronary artery disease    status post coronary artery bypass grafting times 07/10/2004  . Diabetes mellitus   . Hypercholesteremia   . Hypertension   . Mechanical heart valve present    H. aortic valve replacement at the time of bypass surgery October 2005  . Moderate to severe pulmonary hypertension ([place])   . Peripheral arterial disease (HCC)    history of left common iliac artery PTA and stenting for a chronic total occlusion 08/26/01    Past Surgical History:  Procedure Laterality Date  . AORTIC VALVE REPLACEMENT    . CARDIAC CATHETERIZATION  11/10/2004   40% right common illiac, 70% in stent restenosis of distal left common illiac,   . CARDIAC CATHETERIZATION  05/18/2004   LAD 50-70% midstenosis, RCA dominant w/50% stenosis, 50% Right common Illiac artery ostial stenosis, 90% in stent restenosis within midportion of left common illiac stent  . Carotid Duplex  03/12/2012   RSA-elev. velocities suggestive of a 50-69% diameter reduction, Right&Left Bulb/Prox ICA-mild-mod.fibrous plaqueelevating Velocities abnormal study.  . CHOLECYSTECTOMY N/A 03/01/2018   Procedure: LAPAROSCOPIC CHOLECYSTECTOMY;  Surgeon: Judeth Horn, MD;   Location: [place];  Service: General;  Laterality: N/A;  . COLONOSCOPY WITH PROPOFOL N/A 01/22/2018   Procedure: COLONOSCOPY WITH PROPOFOL;  Surgeon: Wilford Corner, MD;  Location: [place];  Service: Endoscopy;  Laterality: N/A;  . ESOPHAGOGASTRODUODENOSCOPY (EGD) WITH PROPOFOL N/A 01/22/2018   Procedure: ESOPHAGOGASTRODUODENOSCOPY (EGD) WITH PROPOFOL;  Surgeon: Wilford Corner, MD;  Location: [place];  Service: Endoscopy;  Laterality: N/A;  . ESOPHAGOGASTRODUODENOSCOPY (EGD) WITH PROPOFOL N/A 11/21/2018   Procedure: ESOPHAGOGASTRODUODENOSCOPY (EGD) WITH PROPOFOL;  Surgeon: [name], MD;  Location: [place];  Service: Gastroenterology;  Laterality: N/A;  . ESOPHAGOGASTRODUODENOSCOPY (EGD) WITH PROPOFOL Left 02/27/2019   Procedure: ESOPHAGOGASTRODUODENOSCOPY (EGD) WITH PROPOFOL;  Surgeon: Arta Silence, MD;  Location: [facility] ENDOSCOPY;  Service: Endoscopy;  Laterality: Left;  . GIVENS CAPSULE STUDY N/A 11/21/2018   Procedure: GIVENS CAPSULE STUDY;  Surgeon: [name], MD;  Location: [place];  Service: Gastroenterology;  Laterality: N/A;  To be deployed during EGD  . Lower Ext. Duplex  03/12/2012   Right Proximal CIA- vessel narrowing w/elevated velocities 0-49% diameter reduction. Right SFA-mild mixed density plaque throughout vessel.  [name] NM MYOCAR PERF WALL MOTION  05/19/2010   protocol: Persantine, post stress EF 65%, negative for ischemia, low risk scan  . RIGHT HEART CATH N/A 06/27/2018   Procedure: RIGHT HEART CATH;  Surgeon: [name], MD;  Location: [place] CV LAB;  Service: Cardiovascular;  Laterality: N/A;  . TRANSTHORACIC ECHOCARDIOGRAM  08/29/2012   Moderately calcified annulus of mitral valve, moderate regurg. of both mitral valve and tricuspid valve.     Prior to Admission medications   Medication Sig Start Date End Date Taking? Authorizing Provider  acetaminophen (TYLENOL) 325 MG tablet Take 2 tablets (650 mg total) by mouth every 6 (six) hours as  needed for mild pain, fever or headache (for pain or headaches). Patient taking differently: Take 325 mg by mouth every 6 (six) hours as needed for fever or headache (pain).  03/03/18  Yes Rayburn, Floyce Stakes, PA-C  ALPRAZolam (XANAX) 0.5 MG tablet Take 0.25 mg by mouth at bedtime.    Yes [provider]  atorvastatin (LIPITOR) 80 MG tablet Take 1 tablet (80 mg total) by mouth daily at 6 PM. 11/22/18  Yes [name], MD  Cholecalciferol (VITAMIN D3) 1000 UNITS CAPS Take 1,000 Units by mouth daily with lunch.    Yes [provider]  colchicine 0.6 MG tablet Take 0.6 mg by mouth daily as needed (gout attack).   Yes [provider]  Cyanocobalamin (VITAMIN B12) 1000 MCG TBCR Take 1,000 mcg by mouth daily with lunch.    Yes [provider]  furosemide (LASIX) 40 MG tablet Take 80 mg by mouth daily.   Yes [provider]  hydrALAZINE (APRESOLINE) 100 MG tablet TAKE 1 TABLET (100 MG TOTAL) BY MOUTH 3 (THREE) TIMES DAILY. 01/20/19  Yes [name], MD  irbesartan (AVAPRO) 300 MG tablet TAKE 1 TABLET DAILY (PLEASE MAKE APPOINTMENT FOR REFILLS) Patient taking differently: Take 300 mg by mouth daily.  08/06/18  Yes Lorretta Harp, MD  metFORMIN (GLUCOPHAGE) 500 MG tablet Take 500 mg by mouth 2 (two) times daily with a meal.    Yes [provider]  metoprolol tartrate (LOPRESSOR) 25 MG tablet Take 0.5 tablets (12.5 mg total) by mouth 2 (two) times daily. 03/05/19  Yes [place] Eric J, DO  ondansetron (ZOFRAN ODT) 4 MG disintegrating tablet Take 1 tablet (4 mg total) by mouth every 8 (eight) hours as needed for nausea or vomiting. 03/09/19  Yes [name], Kaitlyn E, PA-C  pantoprazole (PROTONIX) 40 MG tablet Take 40 mg by mouth 2 (two) times daily.  12/05/14  Yes [provider]  Probiotic Product (PROBIOTIC PO) Take 1 tablet by mouth daily.   Yes [provider]  spironolactone (ALDACTONE) 25 MG tablet TAKE ONE-HALF TABLET BY MOUTH  DAILY Patient taking differently: Take 25 mg by mouth daily.  03/21/19  Yes [name], MD  tadalafil (ADCIRCA/CIALIS) 20 MG tablet Take 1 tablet (20 mg total) by mouth 2 (two) times daily. 01/02/19  Yes [name], MD  vitamin C (ASCORBIC ACID) 500 MG tablet Take 500 mg by mouth daily with lunch.   Yes [provider]  warfarin (COUMADIN) 5 MG tablet TAKE 1.5 TO 2 TABLETS BY MOUTH DAILY AS DIRECTED BY COUMADIN CLINIC Patient taking differently: Take 5-7.5 mg by mouth See admin instructions. Take 5 mg by mouth on Monday and Friday morning, take 7.5 mg on Sunday, Tuesday, Wednesday, Thursday, Saturday morning. 03/17/19  Yes Lorretta Harp, MD  cephALEXin (KEFLEX) 500 MG capsule Take 1 capsule (500 mg total) by mouth 4 (four) times daily. Patient not taking: Reported on 04/03/2019 03/22/19   Charlesetta Shanks, MD  ferrous sulfate 325 (65 FE) MG tablet Take 1 tablet (325 mg total) by mouth 2 (two) times daily with a meal. Patient not taking: Reported on 03/22/2019 11/22/18   [name], MD  magnesium oxide (MAG-OX) 400 (241.3 Mg) MG tablet Take 1 tablet (400 mg total) by mouth daily. Patient not taking: Reported on 03/22/2019 01/30/18   [name], MD    Current Facility-Administered Medications  Medication Dose Route Frequency Provider Last Rate Last Dose  . 0.9 %  sodium chloride infusion  10 mL/hr Intravenous Once Bonnell Public Tublu, MD      . acetaminophen (TYLENOL) tablet 325 mg  325 mg Oral Q6H PRN Vashti Hey, MD   325 mg at 04/03/19 2258  . acidophilus (RISAQUAD) capsule 1 capsule  1 capsule Oral Daily Rizwan, Eunice Blase, MD      . ALPRAZolam Duanne Moron) tablet 0.25 mg  0.25 mg Oral QHS Bonnell Public Tublu, MD   0.25 mg at 04/03/19 2241  . atorvastatin (LIPITOR) tablet 80 mg  80 mg Oral q1800 Vashti Hey, MD   80 mg at 04/03/19 2242  . ceFAZolin (ANCEF) IVPB 1 g/50 mL premix  1 g Intravenous Q12H Rizwan, Saima, MD 100 mL/hr at  04/04/19 1025 1 g at 04/04/19 1025  . furosemide (LASIX) injection 40 mg  40 mg Intravenous BID Rizwan, Eunice Blase, MD      . hydrALAZINE (APRESOLINE) tablet 100 mg  100 mg Oral TID Bonnell Public Tublu, MD   100 mg at 04/04/19 1107  . insulin aspart (novoLOG) injection 0-9 Units  0-9 Units Subcutaneous TID WC Bonnell Public Tublu, MD      . metoprolol tartrate (LOPRESSOR) tablet 12.5 mg  12.5 mg Oral BID Bonnell Public Tublu, MD   12.5 mg at 04/03/19 2240  . ondansetron (ZOFRAN-ODT) disintegrating tablet 4 mg  4 mg Oral Q8H PRN Bonnell Public Tublu, MD      . pantoprazole (PROTONIX) injection 40 mg  40 mg Intravenous Q12H [name], MD   40 mg at 04/04/19 1109  . spironolactone (ALDACTONE) tablet 25 mg  25 mg Oral Daily Bonnell Public Tublu, MD      . tadalafil (CIALIS) tablet 20 mg  20 mg Oral BID Bonnell Public Tublu, MD   20 mg at 04/04/19 1108    Allergies as of 04/03/2019 - Review Complete 04/03/2019  Allergen Reaction Noted  . Flagyl [metronidazole] Rash 02/21/2018  . Coreg [carvedilol] Other (See Comments) 01/18/2018  . Losartan Swelling 08/22/2013  . Verapamil Hives 08/22/2013  . Zetia [ezetimibe] Other (See Comments) 10/06/2011  . Zocor [simvastatin - high dose] Other (See Comments) 10/06/2011    Family History  Problem Relation Age of Onset  . Breast cancer Neg Hx     Social History   Socioeconomic History  . Marital status: Widowed    Spouse name: Not on file  . Number of children: Not on file  . Years of education: Not on file  . Highest education level: Not on file  Occupational History  . Occupation: Retired  Scientific laboratory technician  . Financial resource strain: Not hard at all  . Food insecurity    Worry: Never true    Inability: Never true  . Transportation needs    Medical: No    Non-medical: No  Tobacco Use  . Smoking status: Former Smoker    Packs/day: 1.00    Years: 30.00    Pack years: 30.00    Types: Cigarettes  . Smokeless  tobacco: Never Used  Substance and Sexual Activity  . Alcohol use: No    Alcohol/week: 0.0 standard drinks  . Drug use: No  . Sexual activity: Not Currently  Lifestyle  . Physical activity    Days per week: 0 days    Minutes per session: 0 min  . Stress: Only a little  Relationships  . Social connections    Talks on phone: More than  three times a week    Gets together: More than three times a week    Attends religious service: Never    Active member of club or organization: No    Attends meetings of clubs or organizations: Never    Relationship status: Widowed  . Intimate partner violence    Fear of current or ex partner: No    Emotionally abused: No    Physically abused: No    Forced sexual activity: No  Other Topics Concern  . Not on file  Social History Narrative  . Not on file    Review of Systems: As per HPI, all others negative  Physical Exam: Vital signs in last 24 hours: Temp:  [97.8 F (36.6 C)-98.7 F (37.1 C)] 97.8 F (36.6 C) (06/26 0431) Pulse Rate:  [56-104] 79 (06/26 1135) Resp:  [14-42] 24 (06/26 1135) BP: (121-185)/(35-103) 172/38 (06/26 1135) SpO2:  [84 %-100 %] 97 % (06/26 1135) FiO2 (%):  [40 %] 40 % (06/26 0505) Weight:  [56.7 kg-57.6 kg] 57.6 kg (06/25 2000) Last BM Date: 04/03/19([place]) General:   Alert,  Elderly, chronically ill- and frail- and weak-appearing but NAD Head:  Normocephalic and atraumatic. Eyes:  Sclera clear, no icterus.   Conjunctiva pale Ears:  Normal auditory acuity. Nose:  No deformity, discharge,  or lesions. Mouth:  No deformity or lesions.  Oropharynx pale and dry Neck:  Supple; no masses or thyromegaly. Lungs:  Clear throughout to auscultation.   No wheezes, crackles, or rhonchi. No acute distress. Heart:  Regular rate and rhythm; no murmurs, clicks, rubs,  or gallops. Abdomen:  Soft, nontender and nondistended. No masses, hepatosplenomegaly or hernias noted. Normal bowel sounds, without guarding, and without  rebound.     Msk:  Symmetrical without gross deformities. Normal posture. Pulses:  Normal pulses noted. Extremities:  Without clubbing or edema. Neurologic:  Alert and  oriented x4;  grossly normal neurologically. Skin:  Pale, scattered ecchymosesCervical Nodes:  No significant cervical adenopathy. Psych:  Alert and cooperative. Normal mood and affect.   Lab Results: Recent Labs    04/03/19 1456 04/04/19 0245 04/04/19 0821  WBC 3.1*  --  3.6*  HGB 7.0* 7.3* 7.8*  HCT 23.1* 22.8* 23.9*  PLT 192  --  176   BMET Recent Labs    04/03/19 1456 04/04/19 0821  NA 132* 136  K 3.6 3.9  CL 103 105  CO2 19* 20*  GLUCOSE 148* 126*  BUN 36* 30*  CREATININE 1.65* 1.46*  CALCIUM 8.5* 8.5*   LFT Recent Labs    04/03/19 1456  PROT 6.6  ALBUMIN 2.9*  AST 24  ALT 14  ALKPHOS 75  BILITOT 1.2   PT/INR Recent Labs    04/04/19 0245 04/04/19 0821  LABPROT 27.9* 29.5*  INR 2.7* 2.9*    Studies/Results: Dg Chest Port 1 View  Result Date: 04/04/2019 CLINICAL DATA:  Acute respiratory distress EXAM: PORTABLE CHEST 1 VIEW COMPARISON:  S yesterday FINDINGS: Diffuse interstitial opacity with Kerley lines. Trace pleural effusions. There is cardiomegaly history of CABG. Diaphragm flattening attributed to hyperinflation. IMPRESSION: CHF that has progressed from yesterday. Electronically Signed   By: Monte Fantasia M.D.   On: 04/04/2019 05:35   Dg Chest Port 1 View  Result Date: 04/03/2019 CLINICAL DATA:  Shortness of breath EXAM: PORTABLE CHEST 1 VIEW COMPARISON:  02/28/2019, 11/20/2018 FINDINGS: Post sternotomy changes. Cardiomegaly with vascular congestion and diffuse increased hazy and interstitial opacities suspicious for edema. No large effusion. Aortic atherosclerosis. IMPRESSION:  Cardiomegaly with vascular congestion and bilateral interstitial and ground-glass opacity suggestive of pulmonary edema. Electronically Signed   By: Donavan Foil M.D.   On: 04/03/2019 19:09     Impression:  1.  Hematochezia, one episode, none since. 2.  Acute on chronic anemia, most recent Hgb ~ 7 compared to ~ 8 previously. 3.  Mechanoprosthetic heart valve replacement, on anticoagulation.  Plan:  1.  This is difficult situation.  While her most recent isolated episode of hematochezia yesterday could have been from hemorrhoids, the overall chronic trend of anemia is suspected to be from generalized GI tract mucosal friability likely enhanced from her chronic anticoagulation.  In these circumstances, focal (or principally) causative lesion amenable to endoscopic therapy is rarely identified; and, concordant with this, she has had three endoscopies, capsule endoscopy and colonoscopy all within ~ one year to investigate her anemia and bleeding, without focal lesion identified. 2.  I don't think there is any role for further GI tract endoscopic or colonoscopic evaluation in absence of rampant destabilizing GI bleeding, which patient fortunately does not currently have.  I would advise ongoing IV iron treatments and supportive management of anemia with blood transfusions as needed. Not sure if there's any way to de-escalate her anticoagulation to the point that she is still protected from cardiac standpoint but perhaps might have less propensity to have recurrent anemia; will defer that to cardiologists and primary hospitalist team. 3.  Would advance diet as tolerated. 4.  PPI indefinitely. 5.  [facility] will sign-off; please call with questions; thank you for the consultation.   LOS: 1 day   Cara Aguino M  04/04/2019, 12:35 PM  Cell [phone] If no answer or after 5 PM call [phone]

## 2019-04-04 NOTE — Progress Notes (Signed)
RT called to pt room for a rapid response, pt sats in the 60's. RT found pt on NRB 100% and sats od 99% with RR 35-45. Pt using accessory muscles, purse lipped breathing and complaining of smothering. RT placed pt on BIPAP V60 for WOB with the following settings 12/6 BUR 12 FIO2 40%. RT to obtain a ABG at this time. RT will continue to monitor.

## 2019-04-04 NOTE — Progress Notes (Signed)
Patient tolerated well with oxygen saturation maintained at above 93%.  Oxygen discontiniued, patient is on room air.  Will continue to monitor.

## 2019-04-04 NOTE — Significant Event (Signed)
Rapid Response Event Note  Overview:  Called about pt with increased WOB. On arrival pt spO2 98% on NRB with accessory muscle use noted. RR 37. Pt c/o SOB, BLE edema 2+. RT at bedside. Lungs clear/diminished throughout.     Interventions: Provider notified CXR: CHF that has progressed from prior film 60mg  Lasix IV Abg: 7.02/06/96/19 BIPAP Purewick D/c IVF  Plan of Care (if not transferred): Continue to monitor pt respiratory status. Plan to diurese pt to help with breathing. Keep on BIPAP for now. Continue to monitor hgb (last result 7.3, no active bleeding noted since arrival from ED). RN instructed to call with any changes or concerns. Pt resting in bed on BIPAP with noticeably decreased WOB.   Event Summary:  called at  0444   event ended at  [place]

## 2019-04-04 NOTE — Progress Notes (Signed)
Pt order is for PRN BIPAP. Pt not in need of BIPAP at this time. Pt respiratory status stable at this time on [place] 2 Lpm, SpO2 94%. RT will continue to monitor.

## 2019-04-05 DIAGNOSIS — I2510 Atherosclerotic heart disease of native coronary artery without angina pectoris: Secondary | ICD-10-CM

## 2019-04-05 DIAGNOSIS — D631 Anemia in chronic kidney disease: Secondary | ICD-10-CM

## 2019-04-05 LAB — CBC
HCT: 23.3 % — ABNORMAL LOW (ref 36.0–46.0)
HCT: 22.4 % — ABNORMAL LOW (ref 36.0–46.0)
Hemoglobin: 7.6 g/dL — ABNORMAL LOW (ref 12.0–15.0)
Hemoglobin: 7.4 g/dL — ABNORMAL LOW (ref 12.0–15.0)
MCH: 31.1 pg (ref 26.0–34.0)
MCH: 31.4 pg (ref 26.0–34.0)
MCHC: 32.6 g/dL (ref 30.0–36.0)
MCHC: 33 g/dL (ref 30.0–36.0)
MCV: 95.5 fL (ref 80.0–100.0)
MCV: 94.9 fL (ref 80.0–100.0)
Platelets: 151 10*3/uL (ref 150–400)
Platelets: 148 10*3/uL — ABNORMAL LOW (ref 150–400)
RBC: 2.44 MIL/uL — ABNORMAL LOW (ref 3.87–5.11)
RBC: 2.36 MIL/uL — ABNORMAL LOW (ref 3.87–5.11)
RDW: 17.1 % — ABNORMAL HIGH (ref 11.5–15.5)
RDW: 17 % — ABNORMAL HIGH (ref 11.5–15.5)
WBC: 2.4 10*3/uL — ABNORMAL LOW (ref 4.0–10.5)
WBC: 2.1 10*3/uL — ABNORMAL LOW (ref 4.0–10.5)
nRBC: 0 % (ref 0.0–0.2)
nRBC: 0 % (ref 0.0–0.2)

## 2019-04-05 LAB — GLUCOSE, CAPILLARY
Glucose-Capillary: 93 mg/dL (ref 70–99)
Glucose-Capillary: 203 mg/dL — ABNORMAL HIGH (ref 70–99)
Glucose-Capillary: 133 mg/dL — ABNORMAL HIGH (ref 70–99)
Glucose-Capillary: 158 mg/dL — ABNORMAL HIGH (ref 70–99)

## 2019-04-05 LAB — BASIC METABOLIC PANEL
Anion gap: 12 (ref 5–15)
BUN: 31 mg/dL — ABNORMAL HIGH (ref 8–23)
CO2: 20 mmol/L — ABNORMAL LOW (ref 22–32)
Calcium: 8.1 mg/dL — ABNORMAL LOW (ref 8.9–10.3)
Chloride: 102 mmol/L (ref 98–111)
Creatinine, Ser: 1.49 mg/dL — ABNORMAL HIGH (ref 0.44–1.00)
GFR calc Af Amer: 38 mL/min — ABNORMAL LOW (ref >60)
GFR calc non Af Amer: 33 mL/min — ABNORMAL LOW (ref >60)
Glucose, Bld: 108 mg/dL — ABNORMAL HIGH (ref 70–99)
Potassium: 3.5 mmol/L (ref 3.5–5.1)
Sodium: 134 mmol/L — ABNORMAL LOW (ref 135–145)

## 2019-04-05 LAB — PROTIME-INR
INR: 3 — ABNORMAL HIGH (ref 0.8–1.2)
Prothrombin Time: 31.1 seconds — ABNORMAL HIGH (ref 11.4–15.2)

## 2019-04-05 MED ORDER — GLUCERNA SHAKE PO LIQD
237.0000 mL | Freq: Two times a day (BID) | ORAL | Status: DC
  Administered 2019-04-06: 237 mL via ORAL

## 2019-04-05 MED ORDER — WARFARIN - PHARMACIST DOSING INPATIENT
Freq: Every day | Status: DC
  Administered 2019-04-06: 19:00:00

## 2019-04-05 MED ORDER — ENSURE ENLIVE PO LIQD: 237.0000 mL | Freq: Two times a day (BID) | ORAL | Status: DC

## 2019-04-05 NOTE — Progress Notes (Signed)
Initial Nutrition Assessment   RD working remotely.  DOCUMENTATION CODES:   Not applicable  INTERVENTION:  Provide Glucerna Shake po BID, each supplement provides 220 kcal and 10 grams of protein.  Encourage adequate PO intake.   NUTRITION DIAGNOSIS:   Inadequate oral intake related to decreased appetite as evidenced by per patient/family report.  GOAL:   Patient will meet greater than or equal to 90% of their needs  MONITOR:   PO intake, Supplement acceptance, Skin, Weight trends, Labs, I & O's  REASON FOR ASSESSMENT:   Malnutrition Screening Tool    ASSESSMENT:   78 y.o. female a past medical history significant for PAD  moderate to severe pulmonary HTN; PAF,  Mechanical AVR on Coumadin; HTN; HLD; DM; and CAD s/p CABG, and erosive gastritis. Presents with acute GI bleed and anemia due to acute blood loss   Pt received one unit PRBC. Pt fluid overloaded likely due to blood transfusion per MD. Pt currently on IV lasix.  Pt with no further diarrhea or bleeding per MD. RD contacted pt via inpatient room phone. Pt reports having a decreased appetite over the past 2-3 weeks, however still consumes 2-3 meals a day. Usual meals at home consists of a vegetable, starch, and protein. Pt reports consuming Glucerna shakes on occasion. Pt is currently on a soft diet with thin liquids with 50-75% meal completion. RD to order Glucerna to aid in caloric and protein needs.   Unable to complete Nutrition-Focused physical exam at this time.   Labs and medications reviewed.   Diet Order:   Diet Order            DIET SOFT Room service appropriate? Yes; Fluid consistency: Thin  Diet effective now              EDUCATION NEEDS:   Not appropriate for education at this time  Skin:  Skin Assessment: Reviewed RN Assessment  Last BM:  6/26  Height:   Ht Readings from Last 1 Encounters:  04/05/19 5\' 1"  (1.549 m)    Weight:   Wt Readings from Last 1 Encounters:  04/05/19 56.7  kg    Ideal Body Weight:  47.7 kg  BMI:  Body mass index is 23.62 kg/m.  Estimated Nutritional Needs:   Kcal:  1500-1700  Protein:  65-80 grams  Fluid:  >/= 1.5 L/day    Corrin Parker, MS, RD, LDN Pager # [phone] After hours/ weekend pager # [phone]

## 2019-04-05 NOTE — Plan of Care (Signed)

## 2019-04-05 NOTE — Progress Notes (Addendum)
PROGRESS NOTE    [name]   BSJ:[id number]  DOB: 1940/12/27  DOA: 04/03/2019 PCP: [name], MD   Brief Narrative:  [name] is an 78 y.o. female a past medical history significantforPAD  moderate to severe pulmonary HTN; PAF,  Mechanical AVR on Coumadin; HTN; HLD; DM; and CAD s/p CABG, and erosive gastritis.  She present for blood noted on her depends. She began to have diarrhea after she was started on Keflex for cellulitis of the left leg and has thus been wearing depends.  She has also been taking Protonix for abdominal pain started by GI. She continues to have nausea and some anorexia.    Subjective: She has no complaints of dyspnea at rest. She has no recurrence of diarrhea or blood stools since being admitted. Her left leg is feeling better.     Assessment & Plan:   Principal Problem:   Acute GI bleed- h/o PUD - Prior EGDs have shown gastric ulcers- on BID PPI - FOB neg in ED- ? If hemorrhoidal bleed related to diarrhea  - Last EGD on 02/27/19> patchy mildly friable mucosa with contact bleeding was found in the gastric fundus, in the gastric body and in the gastric antrum. - Last Colonoscopy, 01/22/18 > diverticulosis and internal hemorrhoids - no further diarrhea or bleeding noted- INR remains therapeutic- cont Coumadin  Active Problems: Anemia due to acute blood loss Chronic anemia due to chronic inflammation, hemolysis (mechanical valve), CKD & Iron deficiency - baseline Hb ~ 8 - Hb 7 on admission for which she received 1 u PRBC- Hb 7- 8 range - follows with Dr Jana Hakim as outpt and receives Aranesp, Iron infusions- last Iron infusion 6/11 - following  Acute respiratory failure due to acute on diastolic CHF- requiring BiPAP - last ECHO from 02/24/18 reveals grade 3 diastolic CHF - fluid overload liked occurred due to blood transfusion - weaned off BiPAP   - cont IV Lasix and wean O2- still on 2 L today - cont Aldactone which she takes as outpt  Cellulitis left leg- chronic LLE edema - continues to have cellulitis- resumed Ancef 6/26- appears to be improving.  - elevate Leg today- she states it is chronically swollen - Venous duplex done prior to admission 03/22/19 was negative  Mild leukopenia - following  Essential HTN - on Hydralazine, Metoprolol, Aldactone - Avapro on hold as Cr was elevated on admission     Mechanical aortic heart valve present - INR therapeutic- Goal 2.5- 3.5 -- Coumadin on hold- as GI bleed has not recurred, will resume Coumadin today   Coronary artery disease s/p CABG PAD Bilateral (moderate) carotid stenosis - on Lipitor and Coumadin  Pulm HTN - cont Tadalafil   Paroxysmal Atrial fibrillation   - Coumadin on hold - cont Metoprolol   AKI on CKD 3 - improving- cont to follow    Diabetes mellitus type 2, controlled  - Holding Metformin - cont SSI  Possibly rheumatoid arthritis - plans for f/u with rheumatologist     Time spent in minutes: 30 min DVT prophylaxis: INR therapeutic  Code Status: Full code Family Communication:   Disposition Plan: transfer to telemetry- f/u on GI bleed and hypoxia Consultants:   GI   Procedures:   none Antimicrobials:  Anti-infectives (From admission, onward)   Start     Dose/Rate Route Frequency Ordered Stop   04/04/19 0830  ceFAZolin (ANCEF) IVPB 1 g/50 mL premix     1 g 100 mL/hr over  30 Minutes Intravenous Every 12 hours 04/04/19 0811         Objective: Vitals:   04/05/19 0414 04/05/19 0450 04/05/19 0640 04/05/19 0845  BP: (!) 141/30 (!) 127/41  (!) 127/38  Pulse: 73 64  70  Resp: (!) 29 (!) 23  (!) 25  Temp: 97.8 F (36.6 C)   (!) 96.7 F (35.9 C)  TempSrc: Oral   Axillary  SpO2: 96% 96%  97%  Weight:   56.7 kg   Height:   5\' 1"  (1.549 m)     Intake/Output Summary (Last 24 hours) at 04/05/2019 0913 Last data filed at 04/05/2019 0645 Gross per 24 hour  Intake 396.64 ml  Output 2225 ml  Net -1828.36 ml   Filed Weights    04/03/19 1406 04/03/19 2000 04/05/19 0640  Weight: 56.7 kg 57.6 kg 56.7 kg    Examination: General exam: Appears comfortable  HEENT: PERRLA, oral mucosa moist, no sclera icterus or thrush Respiratory system: Clear to auscultation. Respiratory effort normal. Cardiovascular system: S1 & S2 heard,  No murmurs  Gastrointestinal system: Abdomen soft, non-tender, nondistended. Normal bowel sounds   Central nervous system: Alert and oriented. No focal neurological deficits. Extremities: No cyanosis, clubbing - left leg swollen - erythema improving Skin: No rashes or ulcers Psychiatry:  Mood & affect appropriate.      Data Reviewed: I have personally reviewed following labs and imaging studies  CBC: Recent Labs  Lab 04/03/19 1456 04/04/19 0245 04/04/19 0821 04/05/19 0731  WBC 3.1*  --  3.6* 2.4*  HGB 7.0* 7.3* 7.8* 7.6*  HCT 23.1* 22.8* 23.9* 23.3*  MCV 99.6  --  94.1 95.5  PLT 192  --  176 284   Basic Metabolic Panel: Recent Labs  Lab 04/03/19 1456 04/04/19 0821 04/05/19 0346  NA 132* 136 134*  K 3.6 3.9 3.5  CL 103 105 102  CO2 19* 20* 20*  GLUCOSE 148* 126* 108*  BUN 36* 30* 31*  CREATININE 1.65* 1.46* 1.49*  CALCIUM 8.5* 8.5* 8.1*   GFR: Estimated Creatinine Clearance: 22.7 mL/min (A) (by C-G formula based on SCr of 1.49 mg/dL (H)). Liver Function Tests: Recent Labs  Lab 04/03/19 1456  AST 24  ALT 14  ALKPHOS 75  BILITOT 1.2  PROT 6.6  ALBUMIN 2.9*   Recent Labs  Lab 04/03/19 1456  LIPASE 43   No results for input(s): AMMONIA in the last 168 hours. Coagulation Profile: Recent Labs  Lab 04/03/19 1456 04/04/19 0245 04/04/19 0821 04/05/19 0346  INR 2.5* 2.7* 2.9* 3.0*   Cardiac Enzymes: No results for input(s): CKTOTAL, CKMB, CKMBINDEX, TROPONINI in the last 168 hours. BNP (last 3 results) No results for input(s): PROBNP in the last 8760 hours. HbA1C: No results for input(s): HGBA1C in the last 72 hours. CBG: Recent Labs  Lab 04/04/19 1133  04/04/19 1309 04/04/19 1619 04/04/19 2223 04/05/19 0615  GLUCAP 171* 169* 89 126* 93   Lipid Profile: No results for input(s): CHOL, HDL, LDLCALC, TRIG, CHOLHDL, LDLDIRECT in the last 72 hours. Thyroid Function Tests: No results for input(s): TSH, T4TOTAL, FREET4, T3FREE, THYROIDAB in the last 72 hours. Anemia Panel: No results for input(s): VITAMINB12, FOLATE, FERRITIN, TIBC, IRON, RETICCTPCT in the last 72 hours. Urine analysis:    Component Value Date/Time   COLORURINE YELLOW 03/09/2019 2119   APPEARANCEUR CLEAR 03/09/2019 2119   LABSPEC 1.006 03/09/2019 2119   PHURINE 7.0 03/09/2019 2119   GLUCOSEU NEGATIVE 03/09/2019 2119   HGBUR MODERATE (A) 03/09/2019 2119  [place] NEGATIVE 03/09/2019 2119   [place] NEGATIVE 03/09/2019 2119   PROTEINUR 30 (A) 03/09/2019 2119   UROBILINOGEN 1.0 10/06/2011 1709   NITRITE NEGATIVE 03/09/2019 2119   LEUKOCYTESUR NEGATIVE 03/09/2019 2119   Sepsis Labs: @LABRCNTIP (procalcitonin:4,lacticidven:4) ) Recent Results (from the past 240 hour(s))  SARS Coronavirus 2 (CEPHEID - Performed in [facility] lab), Hosp Order     Status: None   Collection Time: 04/03/19  3:51 PM   Specimen: Nasopharyngeal Swab  Result Value Ref Range Status   SARS Coronavirus 2 NEGATIVE NEGATIVE Final    Comment: (NOTE) If result is NEGATIVE SARS-CoV-2 target nucleic acids are NOT DETECTED. The SARS-CoV-2 RNA is generally detectable in upper and lower  respiratory specimens during the acute phase of infection. The lowest  concentration of SARS-CoV-2 viral copies this assay can detect is 250  copies / mL. A negative result does not preclude SARS-CoV-2 infection  and should not be used as the sole basis for treatment or other  patient management decisions.  A negative result may occur with  improper specimen collection / handling, submission of specimen other  than nasopharyngeal swab, presence of viral mutation(s) within the  areas targeted by this  assay, and inadequate number of viral copies  (<250 copies / mL). A negative result must be combined with clinical  observations, patient history, and epidemiological information. If result is POSITIVE SARS-CoV-2 target nucleic acids are DETECTED. The SARS-CoV-2 RNA is generally detectable in upper and lower  respiratory specimens dur ing the acute phase of infection.  Positive  results are indicative of active infection with SARS-CoV-2.  Clinical  correlation with patient history and other diagnostic information is  necessary to determine patient infection status.  Positive results do  not rule out bacterial infection or co-infection with other viruses. If result is PRESUMPTIVE POSTIVE SARS-CoV-2 nucleic acids MAY BE PRESENT.   A presumptive positive result was obtained on the submitted specimen  and confirmed on repeat testing.  While 2019 novel coronavirus  (SARS-CoV-2) nucleic acids may be present in the submitted sample  additional confirmatory testing may be necessary for epidemiological  and / or clinical management purposes  to differentiate between  SARS-CoV-2 and other Sarbecovirus currently known to infect humans.  If clinically indicated additional testing with an alternate test  methodology [id number]) is advised. The SARS-CoV-2 RNA is generally  detectable in upper and lower respiratory sp ecimens during the acute  phase of infection. The expected result is Negative. Fact Sheet for Patients:  StrictlyIdeas.no Fact Sheet for Healthcare Providers: BankingDealers.co.za This test is not yet approved or cleared by the [place] FDA and has been authorized for detection and/or diagnosis of SARS-CoV-2 by FDA under an Emergency Use Authorization (EUA).  This EUA will remain in effect (meaning this test can be used) for the duration of the COVID-19 declaration under Section 564(b)(1) of the Act, 21 U.S.C. section 360bbb-3(b)(1),  unless the authorization is terminated or revoked sooner. Performed at [facility], [place] [address] [place], [place] [address]   C difficile quick scan w PCR reflex     Status: None   Collection Time: 04/04/19 10:34 AM   Specimen: STOOL  Result Value Ref Range Status   C Diff antigen NEGATIVE NEGATIVE Final   C Diff toxin NEGATIVE NEGATIVE Final   C Diff interpretation No C. difficile detected.  Final    Comment: Performed at [facility], [place] [address] [place], [place] [address]  Radiology Studies: Dg Chest Port 1 View  Result Date: 04/04/2019 CLINICAL DATA:  Acute respiratory distress EXAM: PORTABLE CHEST 1 VIEW COMPARISON:  S yesterday FINDINGS: Diffuse interstitial opacity with Kerley lines. Trace pleural effusions. There is cardiomegaly history of CABG. Diaphragm flattening attributed to hyperinflation. IMPRESSION: CHF that has progressed from yesterday. Electronically Signed   By: Monte Fantasia M.D.   On: 04/04/2019 05:35   Dg Chest Port 1 View  Result Date: 04/03/2019 CLINICAL DATA:  Shortness of breath EXAM: PORTABLE CHEST 1 VIEW COMPARISON:  02/28/2019, 11/20/2018 FINDINGS: Post sternotomy changes. Cardiomegaly with vascular congestion and diffuse increased hazy and interstitial opacities suspicious for edema. No large effusion. Aortic atherosclerosis. IMPRESSION: Cardiomegaly with vascular congestion and bilateral interstitial and ground-glass opacity suggestive of pulmonary edema. Electronically Signed   By: Donavan Foil M.D.   On: 04/03/2019 19:09      Scheduled Meds: . acidophilus  1 capsule Oral Daily  . ALPRAZolam  0.25 mg Oral QHS  . atorvastatin  80 mg Oral q1800  . feeding supplement (ENSURE ENLIVE)  237 mL Oral BID BM  . furosemide  40 mg Intravenous BID  . hydrALAZINE  100 mg Oral TID  . insulin aspart  0-9 Units Subcutaneous TID WC  . metoprolol tartrate  12.5 mg Oral BID  . pantoprazole (PROTONIX) IV  40 mg Intravenous Q12H  .  spironolactone  25 mg Oral Daily  . tadalafil  20 mg Oral BID   Continuous Infusions: . sodium chloride    .  ceFAZolin (ANCEF) IV Stopped (04/04/19 2243)     LOS: 2 days      [name], MD [facility] Pager: [email or web address] Password [facility] 04/05/2019, 9:13 AM

## 2019-04-05 NOTE — Progress Notes (Signed)
[place] for Coumadin Indication: atrial fibrillation, mechanical aortic valve  Allergies  Allergen Reactions  . Flagyl [Metronidazole] Rash    ALL-OVER BODY RASH  . Coreg [Carvedilol] Other (See Comments)    Terrible cramping in the feet and had a lot of bowel movements, but not diarrhea  . Losartan Swelling    Patient doesn't recall site of swelling  . Verapamil Hives  . Zetia [Ezetimibe] Other (See Comments)    Reaction not recalled  . Zocor [Simvastatin - High Dose] Other (See Comments)    Reaction not recalled    Patient Measurements: Height: 5\' 1"  (154.9 cm) Weight: 125 lb (56.7 kg) IBW/kg (Calculated) : 47.8 Heparin Dosing Weight: 56.7 kg  Vital Signs: Temp: 96.7 F (35.9 C) (06/27 0845) Temp Source: Axillary (06/27 0845) BP: 127/38 (06/27 0845) Pulse Rate: 70 (06/27 0845)  Labs: Recent Labs    04/03/19 1456 04/04/19 0245 04/04/19 0821 04/05/19 0346 04/05/19 0731  HGB 7.0* 7.3* 7.8*  --  7.6*  HCT 23.1* 22.8* 23.9*  --  23.3*  PLT 192  --  176  --  151  APTT  --   --  65*  --   --   LABPROT 26.2* 27.9* 29.5* 31.1*  --   INR 2.5* 2.7* 2.9* 3.0*  --   CREATININE 1.65*  --  1.46* 1.49*  --     Estimated Creatinine Clearance: 22.7 mL/min (A) (by C-G formula based on SCr of 1.49 mg/dL (H)).   Assessment: 78 yo female on chronic Coumadin for afib and mechanical AVR.  Per Coumadin Clinic notes, goal INR 2-3 (goal was changed from 2.5-3.5 to 2-3 in 03/2018). Pt admitted with suspected GIB.  Pharmacy asked to dose Coumadin.  INR 2.9 > 3 today with no Coumadin since 6/25. Appetite documented as fair. No bleeding noted, Hgb low in 7s s/p 1 unit PRBC, platelets are normal.  PTA Coumadin dose 7.5 mg daily except 5 mg on Mon, Fri.  Last dose taken 6/25  Goal of Therapy:  INR 2-3 (per Coumadin Clinic notes) Monitor platelets by anticoagulation protocol: Yes   Plan:  No Coumadin tonight - plan to resume once INR downward  trending Daily INR Monitor for s/sx of bleeding   Thank you for involving pharmacy in this patient's care.  Renold Genta, PharmD, BCPS Clinical Pharmacist Clinical phone for 04/05/2019 until 3p is [phone] 04/05/2019 10:46 AM  **Pharmacist phone directory can be found on [email or web address] listed under [place]**

## 2019-04-05 NOTE — Progress Notes (Signed)
Weaned oxygen through out the day.  Was able to wean to room air for approximately one hour until patient's sat dropped to mid 80's.  Denied SOB.  Replaced on 2 l [place] for approximately 30 minutes, and decreased to 1 l [place] for 30 minutes.  At 715 pm was able to wean to room air after starting incentive spirometer.  O2sat currently at 95% RA.  Will continue to monitor by next shift.

## 2019-04-05 NOTE — Progress Notes (Signed)
Pt c/o nausea. Was given Zofran prior to change of shift. Paged provider and received one time order for 12.5mg  Phenergan IV. Patient given medication. Will continue to monitor. Lajoyce Corners, RN

## 2019-04-06 DIAGNOSIS — L03116 Cellulitis of left lower limb: Secondary | ICD-10-CM

## 2019-04-06 DIAGNOSIS — E876 Hypokalemia: Secondary | ICD-10-CM

## 2019-04-06 DIAGNOSIS — D72819 Decreased white blood cell count, unspecified: Secondary | ICD-10-CM

## 2019-04-06 LAB — BASIC METABOLIC PANEL
Anion gap: 7 (ref 5–15)
BUN: 33 mg/dL — ABNORMAL HIGH (ref 8–23)
CO2: 22 mmol/L (ref 22–32)
Calcium: 8.3 mg/dL — ABNORMAL LOW (ref 8.9–10.3)
Chloride: 105 mmol/L (ref 98–111)
Creatinine, Ser: 1.49 mg/dL — ABNORMAL HIGH (ref 0.44–1.00)
GFR calc Af Amer: 38 mL/min — ABNORMAL LOW (ref >60)
GFR calc non Af Amer: 33 mL/min — ABNORMAL LOW (ref >60)
Glucose, Bld: 100 mg/dL — ABNORMAL HIGH (ref 70–99)
Potassium: 3.4 mmol/L — ABNORMAL LOW (ref 3.5–5.1)
Sodium: 134 mmol/L — ABNORMAL LOW (ref 135–145)

## 2019-04-06 LAB — CBC
HCT: 24.2 % — ABNORMAL LOW (ref 36.0–46.0)
HCT: 22.4 % — ABNORMAL LOW (ref 36.0–46.0)
Hemoglobin: 7.9 g/dL — ABNORMAL LOW (ref 12.0–15.0)
Hemoglobin: 7.3 g/dL — ABNORMAL LOW (ref 12.0–15.0)
MCH: 31.1 pg (ref 26.0–34.0)
MCH: 30.8 pg (ref 26.0–34.0)
MCHC: 32.6 g/dL (ref 30.0–36.0)
MCHC: 32.6 g/dL (ref 30.0–36.0)
MCV: 95.3 fL (ref 80.0–100.0)
MCV: 94.5 fL (ref 80.0–100.0)
Platelets: 151 10*3/uL (ref 150–400)
Platelets: 162 10*3/uL (ref 150–400)
RBC: 2.54 MIL/uL — ABNORMAL LOW (ref 3.87–5.11)
RBC: 2.37 MIL/uL — ABNORMAL LOW (ref 3.87–5.11)
RDW: 16.9 % — ABNORMAL HIGH (ref 11.5–15.5)
RDW: 16.8 % — ABNORMAL HIGH (ref 11.5–15.5)
WBC: 2.7 10*3/uL — ABNORMAL LOW (ref 4.0–10.5)
WBC: 2.9 10*3/uL — ABNORMAL LOW (ref 4.0–10.5)
nRBC: 0 % (ref 0.0–0.2)
nRBC: 0 % (ref 0.0–0.2)

## 2019-04-06 LAB — GLUCOSE, CAPILLARY
Glucose-Capillary: 146 mg/dL — ABNORMAL HIGH (ref 70–99)
Glucose-Capillary: 162 mg/dL — ABNORMAL HIGH (ref 70–99)
Glucose-Capillary: 125 mg/dL — ABNORMAL HIGH (ref 70–99)
Glucose-Capillary: 187 mg/dL — ABNORMAL HIGH (ref 70–99)

## 2019-04-06 LAB — PROTIME-INR
INR: 1.6 — ABNORMAL HIGH (ref 0.8–1.2)
INR: 1.2 (ref 0.8–1.2)
Prothrombin Time: 18.9 seconds — ABNORMAL HIGH (ref 11.4–15.2)
Prothrombin Time: 15.3 seconds — ABNORMAL HIGH (ref 11.4–15.2)

## 2019-04-06 LAB — HEPARIN LEVEL (UNFRACTIONATED): Heparin Unfractionated: <0.1 IU/mL — ABNORMAL LOW (ref 0.30–0.70)

## 2019-04-06 MED ORDER — HEPARIN (PORCINE) 25000 UT/250ML-% IV SOLN
1100.0000 [IU]/h | INTRAVENOUS | Status: DC
  Administered 2019-04-06: 11:00:00 800 [IU]/h via INTRAVENOUS
  Filled 2019-04-06: qty 250

## 2019-04-06 MED ORDER — FUROSEMIDE 10 MG/ML IJ SOLN
40.0000 mg | Freq: Once | INTRAMUSCULAR | Status: AC
  Administered 2019-04-06: 40 mg via INTRAVENOUS
  Filled 2019-04-06: qty 4

## 2019-04-06 MED ORDER — FUROSEMIDE 10 MG/ML IJ SOLN
80.0000 mg | Freq: Two times a day (BID) | INTRAMUSCULAR | Status: DC
  Administered 2019-04-06: 80 mg via INTRAVENOUS
  Filled 2019-04-06: qty 8

## 2019-04-06 MED ORDER — WARFARIN SODIUM 10 MG PO TABS
10.0000 mg | ORAL_TABLET | Freq: Once | ORAL | Status: AC
  Administered 2019-04-06: 10 mg via ORAL
  Filled 2019-04-06: qty 1

## 2019-04-06 MED ORDER — SENNOSIDES-DOCUSATE SODIUM 8.6-50 MG PO TABS
2.0000 | ORAL_TABLET | Freq: Once | ORAL | Status: AC
  Administered 2019-04-06: 2 via ORAL
  Filled 2019-04-06 (×2): qty 2

## 2019-04-06 MED ORDER — POTASSIUM CHLORIDE CRYS ER 20 MEQ PO TBCR
40.0000 meq | EXTENDED_RELEASE_TABLET | ORAL | Status: AC
  Administered 2019-04-06 (×2): 40 meq via ORAL
  Filled 2019-04-06 (×2): qty 2

## 2019-04-06 MED ORDER — SPIRONOLACTONE 25 MG PO TABS
25.0000 mg | ORAL_TABLET | Freq: Two times a day (BID) | ORAL | Status: DC
  Administered 2019-04-06 – 2019-04-07 (×2): 25 mg via ORAL
  Filled 2019-04-06 (×2): qty 1

## 2019-04-06 NOTE — Progress Notes (Signed)
PROGRESS NOTE    [name]   DDU:[id number]  DOB: 26-Feb-1941  DOA: 04/03/2019 PCP: [name], MD   Brief Narrative:  [name] is an 78 y.o. female a past medical history significantforPAD  moderate to severe pulmonary HTN; PAF,  Mechanical AVR on Coumadin; HTN; HLD; DM; and CAD s/p CABG, and erosive gastritis.  She present for blood noted on her depends. She began to have diarrhea after she was started on Keflex for cellulitis of the left leg and has thus been wearing depends.  She has also been taking Protonix for abdominal pain started by GI. She continues to have nausea and some anorexia.    Subjective: She does not have any complaints. She has not walked yet today to know is she is short of breath on exertion.    Assessment & Plan:   Principal Problem:   Acute GI bleed- h/o PUD - Prior EGDs have shown gastric ulcers- on BID PPI - FOB neg in ED- ? If she had a hemorrhoidal bleed related to diarrhea  - Last EGD on 02/27/19> patchy mildly friable mucosa with contact bleeding was found in the gastric fundus, in the gastric body and in the gastric antrum. - Last Colonoscopy, 01/22/18 > diverticulosis and internal hemorrhoids - no further diarrhea or bleeding noted- INR remains therapeutic- cont Coumadin  Active Problems: Anemia due to acute blood loss Chronic anemia due to chronic inflammation, hemolysis (mechanical valve), CKD & Iron deficiency - baseline Hb ~ 8 - Hb 7 on admission for which she received 1 u PRBC- Hb now 7- 8 range - follows with Dr Jana Hakim as outpt and receives Aranesp, Iron infusions- last Iron infusion 6/11 - following  Acute respiratory failure due to acute on diastolic CHF- requiring BiPAP - last ECHO from 02/24/18 reveals grade 3 diastolic CHF - fluid overload liked occurred due to blood transfusion but she did have signs of pulmonary edema on her CXR prior to the transfusion being given and thus will likely a little fluid overloaded  when admitted - weaned off BiPAP   - cont IV Lasix and wean O2- still requiring O2 at rest today- will increase her Lasix and her Aldactone today as she did not make much progress with diuresis in the past 24 hrs - IS also started  Hypokalemia - due to Lasix- replacing - Check Mg+  Cellulitis left leg- chronic LLE edema - continues to have cellulitis- resumed Ancef 6/26- appears to be improving.   - she states her left leg is chronically swollen - Venous duplex done prior to admission 03/22/19 was negative - place TEDS today and continue to elevate leg  Mild leukopenia - following  Essential HTN - on Hydralazine, Metoprolol, Aldactone - Avapro on hold as Cr was elevated on admission     Mechanical aortic heart valve   - INR therapeutic initially - Goal 2.5- 3.5  - Coumadin initially held- as GI bleed has not recurred, Coumadin resumed on 6/27 - unfortunately her INR is 1.6 today- will start Heparin infusion to bridge her   Coronary artery disease s/p CABG PAD Bilateral (moderate) carotid stenosis - on Lipitor and Coumadin  Pulm HTN - cont Tadalafil   Paroxysmal Atrial fibrillation   - Coumadin on hold - cont Metoprolol   AKI on CKD 3 - improving- cont to follow    Diabetes mellitus type 2, controlled  - Holding Metformin - cont SSI  Possibly rheumatoid arthritis - plans for f/u with rheumatologist  Time spent in minutes: 30 min DVT prophylaxis: Heparin infusion Code Status: Full code Family Communication:   Disposition Plan: transfer to telemetry- f/u Hypoxia, INR and cellulitis Consultants:   GI   Procedures:   none Antimicrobials:  Anti-infectives (From admission, onward)   Start     Dose/Rate Route Frequency Ordered Stop   04/04/19 0830  ceFAZolin (ANCEF) IVPB 1 g/50 mL premix     1 g 100 mL/hr over 30 Minutes Intravenous Every 12 hours 04/04/19 0811         Objective: Vitals:   04/05/19 2039 04/06/19 0012 04/06/19 0402 04/06/19 0628   BP: (!) 156/52  (!) 156/41   Pulse: 75 65 71   Resp: 17 (!) 22 20   Temp: 98 F (36.7 C)  97.7 F (36.5 C)   TempSrc: Oral  Oral   SpO2: 95% (!) 88% 94%   Weight:    57.1 kg  Height:    5\' 1"  (1.549 m)    Intake/Output Summary (Last 24 hours) at 04/06/2019 1113 Last data filed at 04/06/2019 0900 Gross per 24 hour  Intake 630 ml  Output 1250 ml  Net -620 ml   Filed Weights   04/03/19 2000 04/05/19 0640 04/06/19 0628  Weight: 57.6 kg 56.7 kg 57.1 kg    Examination: General exam: Appears comfortable  HEENT: PERRLA, oral mucosa moist, no sclera icterus or thrush Respiratory system: Crackles at bases. Respiratory effort normal. Cardiovascular system: S1 & S2 heard,  No murmurs  Gastrointestinal system: Abdomen soft, non-tender, nondistended. Normal bowel sounds   Central nervous system: Alert and oriented. No focal neurological deficits. Extremities: No cyanosis, clubbing - 2 + edema of left leg with erythema and tenderness Skin: No rashes or ulcers Psychiatry:  Mood & affect appropriate.       Data Reviewed: I have personally reviewed following labs and imaging studies  CBC: Recent Labs  Lab 04/03/19 1456 04/04/19 0245 04/04/19 0821 04/05/19 0731 04/05/19 2155 04/06/19 0947  WBC 3.1*  --  3.6* 2.4* 2.1* 2.7*  HGB 7.0* 7.3* 7.8* 7.6* 7.4* 7.9*  HCT 23.1* 22.8* 23.9* 23.3* 22.4* 24.2*  MCV 99.6  --  94.1 95.5 94.9 95.3  PLT 192  --  176 151 148* 443   Basic Metabolic Panel: Recent Labs  Lab 04/03/19 1456 04/04/19 0821 04/05/19 0346 04/06/19 0334  NA 132* 136 134* 134*  K 3.6 3.9 3.5 3.4*  CL 103 105 102 105  CO2 19* 20* 20* 22  GLUCOSE 148* 126* 108* 100*  BUN 36* 30* 31* 33*  CREATININE 1.65* 1.46* 1.49* 1.49*  CALCIUM 8.5* 8.5* 8.1* 8.3*   GFR: Estimated Creatinine Clearance: 22.7 mL/min (A) (by C-G formula based on SCr of 1.49 mg/dL (H)). Liver Function Tests: Recent Labs  Lab 04/03/19 1456  AST 24  ALT 14  ALKPHOS 75  BILITOT 1.2  PROT 6.6   ALBUMIN 2.9*   Recent Labs  Lab 04/03/19 1456  LIPASE 43   No results for input(s): AMMONIA in the last 168 hours. Coagulation Profile: Recent Labs  Lab 04/03/19 1456 04/04/19 0245 04/04/19 0821 04/05/19 0346 04/06/19 0334  INR 2.5* 2.7* 2.9* 3.0* 1.6*   Cardiac Enzymes: No results for input(s): CKTOTAL, CKMB, CKMBINDEX, TROPONINI in the last 168 hours. BNP (last 3 results) No results for input(s): PROBNP in the last 8760 hours. HbA1C: No results for input(s): HGBA1C in the last 72 hours. CBG: Recent Labs  Lab 04/05/19 0615 04/05/19 1116 04/05/19 1639 04/05/19 2211 04/06/19  0619  GLUCAP 93 203* 133* 158* 146*   Lipid Profile: No results for input(s): CHOL, HDL, LDLCALC, TRIG, CHOLHDL, LDLDIRECT in the last 72 hours. Thyroid Function Tests: No results for input(s): TSH, T4TOTAL, FREET4, T3FREE, THYROIDAB in the last 72 hours. Anemia Panel: No results for input(s): VITAMINB12, FOLATE, FERRITIN, TIBC, IRON, RETICCTPCT in the last 72 hours. Urine analysis:    Component Value Date/Time   COLORURINE YELLOW 03/09/2019 2119   APPEARANCEUR CLEAR 03/09/2019 2119   LABSPEC 1.006 03/09/2019 2119   PHURINE 7.0 03/09/2019 2119   GLUCOSEU NEGATIVE 03/09/2019 2119   HGBUR MODERATE (A) 03/09/2019 2119   [place] NEGATIVE 03/09/2019 2119   [place] NEGATIVE 03/09/2019 2119   PROTEINUR 30 (A) 03/09/2019 2119   UROBILINOGEN 1.0 10/06/2011 1709   NITRITE NEGATIVE 03/09/2019 2119   LEUKOCYTESUR NEGATIVE 03/09/2019 2119   Sepsis Labs: @LABRCNTIP (procalcitonin:4,lacticidven:4) ) Recent Results (from the past 240 hour(s))  SARS Coronavirus 2 (CEPHEID - Performed in [place] hospital lab), Hosp Order     Status: None   Collection Time: 04/03/19  3:51 PM   Specimen: Nasopharyngeal Swab  Result Value Ref Range Status   SARS Coronavirus 2 NEGATIVE NEGATIVE Final    Comment: (NOTE) If result is NEGATIVE SARS-CoV-2 target nucleic acids are NOT DETECTED. The SARS-CoV-2 RNA  is generally detectable in upper and lower  respiratory specimens during the acute phase of infection. The lowest  concentration of SARS-CoV-2 viral copies this assay can detect is 250  copies / mL. A negative result does not preclude SARS-CoV-2 infection  and should not be used as the sole basis for treatment or other  patient management decisions.  A negative result may occur with  improper specimen collection / handling, submission of specimen other  than nasopharyngeal swab, presence of viral mutation(s) within the  areas targeted by this assay, and inadequate number of viral copies  (<250 copies / mL). A negative result must be combined with clinical  observations, patient history, and epidemiological information. If result is POSITIVE SARS-CoV-2 target nucleic acids are DETECTED. The SARS-CoV-2 RNA is generally detectable in upper and lower  respiratory specimens dur ing the acute phase of infection.  Positive  results are indicative of active infection with SARS-CoV-2.  Clinical  correlation with patient history and other diagnostic information is  necessary to determine patient infection status.  Positive results do  not rule out bacterial infection or co-infection with other viruses. If result is PRESUMPTIVE POSTIVE SARS-CoV-2 nucleic acids MAY BE PRESENT.   A presumptive positive result was obtained on the submitted specimen  and confirmed on repeat testing.  While 2019 novel coronavirus  (SARS-CoV-2) nucleic acids may be present in the submitted sample  additional confirmatory testing may be necessary for epidemiological  and / or clinical management purposes  to differentiate between  SARS-CoV-2 and other Sarbecovirus currently known to infect humans.  If clinically indicated additional testing with an alternate test  methodology [id number]) is advised. The SARS-CoV-2 RNA is generally  detectable in upper and lower respiratory sp ecimens during the acute  phase of infection.  The expected result is Negative. Fact Sheet for Patients:  StrictlyIdeas.no Fact Sheet for Healthcare Providers: BankingDealers.co.za This test is not yet approved or cleared by the [place] FDA and has been authorized for detection and/or diagnosis of SARS-CoV-2 by FDA under an Emergency Use Authorization (EUA).  This EUA will remain in effect (meaning this test can be used) for the duration of the COVID-19 declaration under Section 564(b)(1)  of the Act, 21 U.S.C. section 360bbb-3(b)(1), unless the authorization is terminated or revoked sooner. Performed at [facility], [place] [address], [place], [place] [address]   C difficile quick scan w PCR reflex     Status: None   Collection Time: 04/04/19 10:34 AM   Specimen: STOOL  Result Value Ref Range Status   C Diff antigen NEGATIVE NEGATIVE Final   C Diff toxin NEGATIVE NEGATIVE Final   C Diff interpretation No C. difficile detected.  Final    Comment: Performed at [facility], [place] [address], [place], [place] [address]         Radiology Studies: No results found.    Scheduled Meds: . acidophilus  1 capsule Oral Daily  . ALPRAZolam  0.25 mg Oral QHS  . atorvastatin  80 mg Oral q1800  . feeding supplement (GLUCERNA SHAKE)  237 mL Oral BID BM  . furosemide  40 mg Intravenous BID  . hydrALAZINE  100 mg Oral TID  . insulin aspart  0-9 Units Subcutaneous TID WC  . metoprolol tartrate  12.5 mg Oral BID  . pantoprazole (PROTONIX) IV  40 mg Intravenous Q12H  . potassium chloride  40 mEq Oral Q4H  . spironolactone  25 mg Oral Daily  . tadalafil  20 mg Oral BID  . warfarin  10 mg Oral ONCE-1800  . Warfarin - Pharmacist Dosing Inpatient   Does not apply q1800   Continuous Infusions: . sodium chloride    .  ceFAZolin (ANCEF) IV 1 g (04/06/19 0930)  . heparin 800 Units/hr (04/06/19 1103)     LOS: 3 days      [name], MD [facility] Pager:  [email or web address] Password [email or web address] 04/06/2019, 11:13 AM

## 2019-04-06 NOTE — Progress Notes (Signed)
Pt placed on 1 L of oxygen by [place] d/t sats dropping to 88% and staying there. Pt O2 sats increased to 92%. Will continue to monitor. Lajoyce Corners, RN

## 2019-04-06 NOTE — Progress Notes (Signed)
[place] for heparin to warfarin Indication: atrial fibrillation, mechanical aortic valve  Allergies  Allergen Reactions  . Flagyl [Metronidazole] Rash    ALL-OVER BODY RASH  . Coreg [Carvedilol] Other (See Comments)    Terrible cramping in the feet and had a lot of bowel movements, but not diarrhea  . Losartan Swelling    Patient doesn't recall site of swelling  . Verapamil Hives  . Zetia [Ezetimibe] Other (See Comments)    Reaction not recalled  . Zocor [Simvastatin - High Dose] Other (See Comments)    Reaction not recalled    Patient Measurements: Height: 5\' 1"  (154.9 cm) Weight: 125 lb 12.8 oz (57.1 kg) IBW/kg (Calculated) : 47.8 Heparin Dosing Weight: 56.7 kg  Vital Signs: Temp: 97.7 F (36.5 C) (06/28 0402) Temp Source: Oral (06/28 0402) BP: 156/41 (06/28 0402) Pulse Rate: 71 (06/28 0402)  Labs: Recent Labs    04/04/19 0821 04/05/19 0346 04/05/19 0731 04/05/19 2155 04/06/19 0334  HGB 7.8*  --  7.6* 7.4*  --   HCT 23.9*  --  23.3* 22.4*  --   PLT 176  --  151 148*  --   APTT 65*  --   --   --   --   LABPROT 29.5* 31.1*  --   --  18.9*  INR 2.9* 3.0*  --   --  1.6*  CREATININE 1.46* 1.49*  --   --  1.49*    Estimated Creatinine Clearance: 22.7 mL/min (A) (by C-G formula based on SCr of 1.49 mg/dL (H)).   Assessment: 78 yo female on chronic warfarin for afib and mechanical AVR.  Per Warfarin Clinic notes, goal INR 2-3 (goal was changed from 2.5-3.5 to 2-3 in 03/2018). Pt admitted with suspected GIB.  Pharmacy asked to dose Warfarin.  INR subtherapeutic down to 1.6 today. Appetite documented as fair to good. No bleeding noted, Hgb low in 7s s/p 1 unit PRBC, platelets are normal.  PTA warfarin dose 7.5 mg daily except 5 mg on Mon, Fri.  Last dose taken 6/25  Goal of Therapy:  Heparin level 0.3-0.7 units/ml INR 2-3 (per Warfarin Clinic)  Monitor platelets by anticoagulation protocol: Yes   Plan:  Heparin drip at  800 units/hr with no bolus due to anemia  8 hr heparin level and INR Warfarin 10 mg PO tonight Daily INR, heparin level, CBC Monitor for s/sx of bleeding   Thank you for involving pharmacy in this patient's care.  Renold Genta, PharmD, BCPS Clinical Pharmacist Clinical phone for 04/06/2019 until 3p is [phone] 04/06/2019 8:09 AM  **Pharmacist phone directory can be found on [email or web address] listed under [place]**

## 2019-04-06 NOTE — Progress Notes (Signed)
[facility] for heparin to warfarin Indication: atrial fibrillation, mechanical aortic valve  Allergies  Allergen Reactions  . Flagyl [Metronidazole] Rash    ALL-OVER BODY RASH  . Coreg [Carvedilol] Other (See Comments)    Terrible cramping in the feet and had a lot of bowel movements, but not diarrhea  . Losartan Swelling    Patient doesn't recall site of swelling  . Verapamil Hives  . Zetia [Ezetimibe] Other (See Comments)    Reaction not recalled  . Zocor [Simvastatin - High Dose] Other (See Comments)    Reaction not recalled    Patient Measurements: Height: 5\' 1"  (154.9 cm) Weight: 125 lb 12.8 oz (57.1 kg) IBW/kg (Calculated) : 47.8 Heparin Dosing Weight: 56.7 kg  Vital Signs: BP: 187/57 (06/28 1400) Pulse Rate: 70 (06/28 1400)  Labs: Recent Labs    04/04/19 0821 04/05/19 0346 04/05/19 0731 04/05/19 2155 04/06/19 0334 04/06/19 0947 04/06/19 1640  HGB 7.8*  --  7.6* 7.4*  --  7.9*  --   HCT 23.9*  --  23.3* 22.4*  --  24.2*  --   PLT 176  --  151 148*  --  151  --   APTT 65*  --   --   --   --   --   --   LABPROT 29.5* 31.1*  --   --  18.9*  --  15.3*  INR 2.9* 3.0*  --   --  1.6*  --  1.2  HEPARINUNFRC  --   --   --   --   --   --  <0.10*  CREATININE 1.46* 1.49*  --   --  1.49*  --   --     Estimated Creatinine Clearance: 22.7 mL/min (A) (by C-G formula based on SCr of 1.49 mg/dL (H)).   Assessment: 78 yo female on chronic warfarin for afib and mechanical AVR.  Per Warfarin Clinic notes, goal INR 2-3 (goal was changed from 2.5-3.5 to 2-3 in 03/2018). Pt admitted with suspected GIB.  Warfarin resumed 6/28.  Also started on heparin with subtherapeutic INR and mechanical valve.  PTA warfarin dose 7.5 mg daily except 5 mg on Mon, Fri.  Last dose taken 6/25.  Heparin level <0.1 on 800 units/hr.  No IV issues per RN.    Goal of Therapy:  Heparin level 0.3-0.7 units/ml INR 2-3 (per Warfarin Clinic)  Monitor platelets by  anticoagulation protocol: Yes   Plan:  Increase Heparin 950 units/hr 8 hr heparin level Daily INR, heparin level, CBC Monitor for s/sx of bleeding   Thank you for involving pharmacy in this patient's care.  [employer], Pharm.D., BCPS Clinical Pharmacist  **Pharmacist phone directory can now be found on [email or web address] ([email or web address]).  Listed under [place].  04/06/2019 6:45 PM

## 2019-04-06 NOTE — Plan of Care (Signed)

## 2019-04-06 NOTE — Progress Notes (Signed)
SATURATION QUALIFICATIONS: (This note is used to comply with regulatory documentation for home oxygen)  Patient Saturations on Room Air at Rest = 96 %  Patient Saturations on Room Air while Ambulating = 91-96%  Patient Saturations on O Liters of oxygen while Ambulating = 96 %  Please briefly explain why patient needs home oxygen: [name] ambulated approximately 300 feet with a front wheel walker on Room Air.  Oxygen saturation dropped from  96% to 91 % briefly and came back to 96%.  RR 20-24 with no c/o sob.  Tolerated well. [name] was on RA prior to walking.

## 2019-04-07 ENCOUNTER — Institutional Professional Consult (permissible substitution): Payer: Medicare HMO | Admitting: Pulmonary Disease

## 2019-04-07 ENCOUNTER — Telehealth: Payer: Self-pay

## 2019-04-07 DIAGNOSIS — J9601 Acute respiratory failure with hypoxia: Secondary | ICD-10-CM

## 2019-04-07 DIAGNOSIS — L03116 Cellulitis of left lower limb: Secondary | ICD-10-CM

## 2019-04-07 DIAGNOSIS — D594 Other nonautoimmune hemolytic anemias: Secondary | ICD-10-CM

## 2019-04-07 DIAGNOSIS — I480 Paroxysmal atrial fibrillation: Secondary | ICD-10-CM

## 2019-04-07 DIAGNOSIS — D508 Other iron deficiency anemias: Secondary | ICD-10-CM

## 2019-04-07 DIAGNOSIS — I739 Peripheral vascular disease, unspecified: Secondary | ICD-10-CM

## 2019-04-07 DIAGNOSIS — I5033 Acute on chronic diastolic (congestive) heart failure: Secondary | ICD-10-CM

## 2019-04-07 DIAGNOSIS — I272 Pulmonary hypertension, unspecified: Secondary | ICD-10-CM

## 2019-04-07 LAB — CBC
HCT: 24.9 % — ABNORMAL LOW (ref 36.0–46.0)
Hemoglobin: 8 g/dL — ABNORMAL LOW (ref 12.0–15.0)
MCH: 30.7 pg (ref 26.0–34.0)
MCHC: 32.1 g/dL (ref 30.0–36.0)
MCV: 95.4 fL (ref 80.0–100.0)
Platelets: 151 10*3/uL (ref 150–400)
RBC: 2.61 MIL/uL — ABNORMAL LOW (ref 3.87–5.11)
RDW: 16.7 % — ABNORMAL HIGH (ref 11.5–15.5)
WBC: 3.1 10*3/uL — ABNORMAL LOW (ref 4.0–10.5)
nRBC: 0 % (ref 0.0–0.2)

## 2019-04-07 LAB — BASIC METABOLIC PANEL
Anion gap: 9 (ref 5–15)
BUN: 36 mg/dL — ABNORMAL HIGH (ref 8–23)
CO2: 21 mmol/L — ABNORMAL LOW (ref 22–32)
Calcium: 8.7 mg/dL — ABNORMAL LOW (ref 8.9–10.3)
Chloride: 105 mmol/L (ref 98–111)
Creatinine, Ser: 1.57 mg/dL — ABNORMAL HIGH (ref 0.44–1.00)
GFR calc Af Amer: 36 mL/min — ABNORMAL LOW (ref >60)
GFR calc non Af Amer: 31 mL/min — ABNORMAL LOW (ref >60)
Glucose, Bld: 98 mg/dL (ref 70–99)
Potassium: 4.4 mmol/L (ref 3.5–5.1)
Sodium: 135 mmol/L (ref 135–145)

## 2019-04-07 LAB — PROTIME-INR
INR: 1.4 — ABNORMAL HIGH (ref 0.8–1.2)
Prothrombin Time: 16.6 seconds — ABNORMAL HIGH (ref 11.4–15.2)

## 2019-04-07 LAB — MAGNESIUM: Magnesium: 1.6 mg/dL — ABNORMAL LOW (ref 1.7–2.4)

## 2019-04-07 LAB — HEPARIN LEVEL (UNFRACTIONATED): Heparin Unfractionated: 0.15 IU/mL — ABNORMAL LOW (ref 0.30–0.70)

## 2019-04-07 LAB — GLUCOSE, CAPILLARY: Glucose-Capillary: 107 mg/dL — ABNORMAL HIGH (ref 70–99)

## 2019-04-07 MED ORDER — WARFARIN SODIUM 10 MG PO TABS: 10.0000 mg | ORAL_TABLET | Freq: Once | ORAL | Status: DC

## 2019-04-07 MED ORDER — ENOXAPARIN SODIUM 60 MG/0.6ML ~~LOC~~ SOLN: 60.0000 mg | SUBCUTANEOUS | 0 refills | Status: DC

## 2019-04-07 MED ORDER — FERROUS SULFATE 325 (65 FE) MG PO TABS: 325.0000 mg | ORAL_TABLET | Freq: Two times a day (BID) | ORAL | 3 refills | Status: DC

## 2019-04-07 MED ORDER — ENOXAPARIN SODIUM 60 MG/0.6ML ~~LOC~~ SOLN
60.0000 mg | SUBCUTANEOUS | Status: DC
  Administered 2019-04-07: 60 mg via SUBCUTANEOUS
  Filled 2019-04-07: qty 0.6

## 2019-04-07 MED ORDER — WARFARIN SODIUM 10 MG PO TABS: ORAL_TABLET | ORAL | Status: DC

## 2019-04-07 MED ORDER — GLUCERNA SHAKE PO LIQD: 237.0000 mL | Freq: Two times a day (BID) | ORAL | 30 refills | Status: DC

## 2019-04-07 MED ORDER — CEFUROXIME AXETIL 500 MG PO TABS: 500.0000 mg | ORAL_TABLET | Freq: Two times a day (BID) | ORAL | 0 refills | Status: DC

## 2019-04-07 MED ORDER — CEFUROXIME AXETIL 500 MG PO TABS: 500.0000 mg | ORAL_TABLET | Freq: Two times a day (BID) | ORAL | 0 refills | Status: AC

## 2019-04-07 NOTE — Progress Notes (Signed)
[place] for warfarin; heparin>> lovenox Indication: atrial fibrillation, mechanical aortic valve  Allergies  Allergen Reactions  . Flagyl [Metronidazole] Rash    ALL-OVER BODY RASH  . Coreg [Carvedilol] Other (See Comments)    Terrible cramping in the feet and had a lot of bowel movements, but not diarrhea  . Losartan Swelling    Patient doesn't recall site of swelling  . Verapamil Hives  . Zetia [Ezetimibe] Other (See Comments)    Reaction not recalled  . Zocor [Simvastatin - High Dose] Other (See Comments)    Reaction not recalled    Patient Measurements: Height: 5\' 1"  (154.9 cm) Weight: 125 lb 6.4 oz (56.9 kg) IBW/kg (Calculated) : 47.8 Heparin Dosing Weight: 56.7 kg  Vital Signs: Temp: 97.8 F (36.6 C) (06/29 0321) Temp Source: Oral (06/29 0321) BP: 157/46 (06/29 0321) Pulse Rate: 66 (06/29 0321)  Labs: Recent Labs    04/05/19 0346  04/05/19 2155 04/06/19 0334 04/06/19 0947 04/06/19 1640 04/06/19 2243 04/07/19 0239  HGB  --    < > 7.4*  --  7.9*  --  7.3*  --   HCT  --    < > 22.4*  --  24.2*  --  22.4*  --   PLT  --    < > 148*  --  151  --  162  --   LABPROT 31.1*  --   --  18.9*  --  15.3*  --  16.6*  INR 3.0*  --   --  1.6*  --  1.2  --  1.4*  HEPARINUNFRC  --   --   --   --   --  <0.10*  --  0.15*  CREATININE 1.49*  --   --  1.49*  --   --   --  1.57*   < > = values in this interval not displayed.    Estimated Creatinine Clearance: 21.6 mL/min (A) (by C-G formula based on SCr of 1.57 mg/dL (H)).   Assessment: 78 yo female on chronic warfarin for afib and mechanical AVR.  Per Warfarin Clinic notes, goal INR 2-3 (goal was changed from 2.5-3.5 to 2-3 in 03/2018). Pt admitted with suspected GIB.  Warfarin resumed 6/28.  Pharmacy also dosing heparin and consulted to change to lovenox -SCr= 1.57, CrCL ~ 20 -hg= 7.3 (baseline ~ 8) -INR= 1.4 (INR was at goal on admission)   PTA warfarin dose 7.5 mg daily except 5  mg on Mon, Fri.  Last dose taken 6/25.      Goal of Therapy:  Heparin level 0.3-0.7 units/ml INR 2-3 (per Warfarin Clinic)  Monitor platelets by anticoagulation protocol: Yes   Plan:  -lovenox 60mg  [place] q24h -Warfarin 10mg  today -After the 10mg  warfarin today would resume home dose of 7.5 mg daily except 5 mg on Mon, Fri.  Hildred Laser, PharmD Clinical Pharmacist **Pharmacist phone directory can now be found on [email or web address] ([email or web address]).  Listed under [place].

## 2019-04-07 NOTE — Discharge Instructions (Signed)
1. Please have your INR checked in 2 days. The Coumadin clinic will tell you when to stop the Lovenox.  2. Do not take Irbesartan right now. You will need to see your PCP in 1 wk and have the following blood work done: Art gallery manager and CBC. Your PCP will let you know when to resume Irbesartan.  You were cared for by a hospitalist during your hospital stay. If you have any questions about your discharge medications or the care you received while you were in the hospital after you are discharged, you can call the unit and asked to speak with the hospitalist on call if the hospitalist that took care of you is not available. Once you are discharged, your primary care physician will handle any further medical issues.   Please note that NO REFILLS for any discharge medications will be authorized once you are discharged, as it is imperative that you return to your primary care physician (or establish a relationship with a primary care physician if you do not have one) for your aftercare needs so that they can reassess your need for medications and monitor your lab values.  Please take all your medications with you for your next visit with your Primary MD. Please ask your Primary MD to get all Hospital records sent to his/her office. Please request your Primary MD to go over all hospital test results at the follow up.   If you experience worsening of your admission symptoms, develop shortness of breath, chest pain, suicidal or homicidal thoughts or a life threatening emergency, you must seek medical attention immediately by calling 911 or calling your MD.   Dennis Bast must read the complete instructions/literature along with all the possible adverse reactions/side effects for all the medicines you take including new medications that have been prescribed to you. Take new medicines after you have completely understood and accpet all the possible adverse reactions/side effects.    Do not drive when taking pain medications or  sedatives.     Do not take more than prescribed Pain, Sleep and Anxiety Medications   If you have smoked or chewed Tobacco in the last 2 yrs please stop. Stop any regular alcohol  and or recreational drug use.   Wear Seat belts while driving.

## 2019-04-07 NOTE — Progress Notes (Signed)
[place] for heparin to warfarin Indication: atrial fibrillation, mechanical aortic valve  Allergies  Allergen Reactions  . Flagyl [Metronidazole] Rash    ALL-OVER BODY RASH  . Coreg [Carvedilol] Other (See Comments)    Terrible cramping in the feet and had a lot of bowel movements, but not diarrhea  . Losartan Swelling    Patient doesn't recall site of swelling  . Verapamil Hives  . Zetia [Ezetimibe] Other (See Comments)    Reaction not recalled  . Zocor [Simvastatin - High Dose] Other (See Comments)    Reaction not recalled    Patient Measurements: Height: 5\' 1"  (154.9 cm) Weight: 125 lb 12.8 oz (57.1 kg) IBW/kg (Calculated) : 47.8 Heparin Dosing Weight: 56.7 kg  Vital Signs: Temp: 97.8 F (36.6 C) (06/29 0321) Temp Source: Oral (06/29 0321) BP: 157/46 (06/29 0321) Pulse Rate: 66 (06/29 0321)  Labs: Recent Labs    04/04/19 2500 04/05/19 0346  04/05/19 2155 04/06/19 0334 04/06/19 0947 04/06/19 1640 04/06/19 2243 04/07/19 0239  HGB 7.8*  --    < > 7.4*  --  7.9*  --  7.3*  --   HCT 23.9*  --    < > 22.4*  --  24.2*  --  22.4*  --   PLT 176  --    < > 148*  --  151  --  162  --   APTT 65*  --   --   --   --   --   --   --   --   LABPROT 29.5* 31.1*  --   --  18.9*  --  15.3*  --  16.6*  INR 2.9* 3.0*  --   --  1.6*  --  1.2  --  1.4*  HEPARINUNFRC  --   --   --   --   --   --  <0.10*  --  0.15*  CREATININE 1.46* 1.49*  --   --  1.49*  --   --   --  1.57*   < > = values in this interval not displayed.    Estimated Creatinine Clearance: 21.6 mL/min (A) (by C-G formula based on SCr of 1.57 mg/dL (H)).   Assessment: 78 yo female on chronic warfarin for afib and mechanical AVR.  Per Warfarin Clinic notes, goal INR 2-3 (goal was changed from 2.5-3.5 to 2-3 in 03/2018). Pt admitted with suspected GIB.  Warfarin resumed 6/28.  Also started on heparin with subtherapeutic INR and mechanical valve.  PTA warfarin dose 7.5 mg daily  except 5 mg on Mon, Fri.  Last dose taken 6/25.  Heparin level 0.15 units/ml.  No IV issues per RN.    Goal of Therapy:  Heparin level 0.3-0.7 units/ml INR 2-3 (per Warfarin Clinic)  Monitor platelets by anticoagulation protocol: Yes   Plan:  Increase Heparin 1100 units/hr Check heparin level in 6-8 hours Daily INR, heparin level, CBC Monitor for s/sx of bleeding  Thank you for involving pharmacy in this patient's care.  Excell Seltzer, Pharm.D Clinical Pharmacist  04/07/2019 4:47 AM

## 2019-04-07 NOTE — Discharge Summary (Signed)
Physician Discharge Summary  [name] SVX:[id number] DOB: 02-16-1941 DOA: 04/03/2019  PCP: [name], MD  Admit date: 04/03/2019 Discharge date: 04/07/2019  Admitted From: home Disposition:  home   Recommendations for Outpatient Follow-up:  1. Please f/u on Hb/ Hct and WBC count in 1-2 wks.  2. Please f//u on BUN/CR and resume Irbesartan if needed     Discharge Condition:  stable   CODE STATUS:  Full code   Consultations:  none    Discharge Diagnoses:  Principal Problem:   GI bleed Active Problems:   Acute respiratory failure with hypoxia- Acute on chronic diastolic CHF (congestive heart failure)     Iron deficiency anemia   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 (HCC)   Left leg cellulitis   Mechanical heart valve present   Chronic anticoagulation   Coronary artery disease   Peripheral arterial disease (HCC)   Anemia associated with stage 3 chronic renal failure (HCC)   Diabetes mellitus type 2, controlled (HCC)   S/P cholecystectomy   Moderate to severe pulmonary hypertension (HCC)   CKD (chronic kidney disease) stage 3, GFR 30-59 ml/min (HCC)   PAF (paroxysmal atrial fibrillation) ([place])       Brief Summary: [name] is an 78 y.o.femalea past medical history significantforPAD  moderate to severe pulmonary HTN; PAF,  Mechanical AVR on Coumadin; HTN; HLD; DM; and CAD s/p CABG,and erosive gastritis.  She present for blood noted on her depends. She began to have diarrhea after she was started on Keflex for cellulitis of the left leg and has thus been wearing depends.  She has also been taking Protonix for abdominal pain started by GI. She continues to have nausea and some anorexia.   Hospital Course:  Principal Problem:   Acute GI bleed- hematochezia - h/o PUD - Prior EGDs have shown gastric ulcers- on BID PPI - FOB neg in ED- ? If she had a hemorrhoidal bleed related to diarrhea  - Last EGD on 02/27/19> patchy mildly  friable mucosa with contact bleeding was found in the gastric fundus, in the gastric body and in the gastric antrum. - Last Colonoscopy, 01/22/18 > diverticulosis and internal hemorrhoids - no further diarrhea or bleeding noted- INR remains therapeutic- cont Coumadin  Active Problems: Anemia due to acute blood loss Chronic anemia due to chronic inflammation, hemolysis (mechanical valve), CKD & Iron deficiency - baseline Hb ~ 8 - Hb 7 on admission for which she received 1 u PRBC- Hb now 8.0 - follows with Dr Jana Hakim as outpt and receives Aranesp, Iron infusions- last Iron infusion 6/11 - resume oral Iron- check CBC in 1 wk   Acute respiratory failure due to acute on diastolic CHF- requiring BiPAP - last ECHO from 02/24/18 reveals grade 3 diastolic CHF - fluid overload liked occurred due to blood transfusion but she did have signs of pulmonary edema on her CXR prior to the transfusion being given and thus will likely a little fluid overloaded when admitted - weaned off BiPAP   aggressively diuresed with IV Lasix and oral Aldactone - she is no longer hypoxic at rest and is able to ambulate ~ 300 ft without hypoxia- she is stable for d/c  AKI on CKD 3 - Cr slightly elevated today due to diuresis - f/u as outpt- cont to hold irbesartan for now  Hypokalemia, hypomagnesemia - due to to Lasix- replacing  Cellulitis left leg- chronic LLE edema - was being managed with Keflex prior to admission  -  placed on Ancef in hospital- subsequently appears to be improving.   - she states her left leg is chronically swollen - Venous duplex done prior to admission 03/22/19 was negative - placed TEDS and advised to continue to elevate leg when in bed   Mild leukopenia - following- needs to be rechecked as outpt  Essential HTN - on Hydralazine, Metoprolol, Aldactone - Avapro on hold as Cr was elevated on admission- cont to hold for now     Mechanical aortic heart valve   - INR therapeutic initially  - Goal 2.5- 3.5  - Coumadin initially held- as GI bleed has not recurred, Coumadin resumed on 6/27 - unfortunately her INR dropped to 1.6  On 6/28 and thus she was placed on Heparin - she will d/c with Lovenox injections and have her INR checked in 2 days at the coumadin clinic- she has injected herself with Lovenox in the hospital and is comfortable with doing it at home   Paroxysmal Atrial fibrillation   CHA2DS2-VASc Score 7 - Coumadin / Lovenox- see above - cont Metoprolol   Coronary artery disease s/p CABG PAD Bilateral (moderate) carotid stenosis - on Lipitor and Coumadin  Pulm HTN - cont Tadalafil-      Diabetes mellitus type 2, controlled  - cont Metformin    Possibly rheumatoid arthritis - plans for f/u with rheumatologist   Discharge Exam: Vitals:   04/07/19 0321 04/07/19 0946  BP: (!) 157/46 (!) 159/50  Pulse: 66 73  Resp: (!) 22   Temp: 97.8 F (36.6 C)   SpO2: 97%    Vitals:   04/06/19 2106 04/07/19 0100 04/07/19 0321 04/07/19 0946  BP: (!) 144/28  (!) 157/46 (!) 159/50  Pulse: 81 66 66 73  Resp:  (!) 23 (!) 22   Temp:   97.8 F (36.6 C)   TempSrc:   Oral   SpO2:  (!) 89% 97%   Weight:   56.9 kg   Height:        General: Pt is alert, awake, not in acute distress Cardiovascular: RRR, S1/S2 +,  + mechanical click Respiratory: CTA bilaterally, no wheezing, no rhonchi Abdominal: Soft, NT, ND, bowel sounds + Extremities:  mild edema left lower leg   Discharge Instructions  Discharge Instructions    Diet - low sodium heart healthy   Complete by: As directed    Diet Carb Modified   Complete by: As directed    Increase activity slowly   Complete by: As directed      Allergies as of 04/07/2019      Reactions   Flagyl [metronidazole] Rash   ALL-OVER BODY RASH   Coreg [carvedilol] Other (See Comments)   Terrible cramping in the feet and had a lot of bowel movements, but not diarrhea   Losartan Swelling   Patient doesn't recall site of  swelling   Verapamil Hives   Zetia [ezetimibe] Other (See Comments)   Reaction not recalled   Zocor [simvastatin - High Dose] Other (See Comments)   Reaction not recalled      Medication List    STOP taking these medications   cephALEXin 500 MG capsule Commonly known as: KEFLEX   irbesartan 300 MG tablet Commonly known as: AVAPRO     TAKE these medications   acetaminophen 325 MG tablet Commonly known as: TYLENOL Take 2 tablets (650 mg total) by mouth every 6 (six) hours as needed for mild pain, fever or headache (for pain or headaches). What changed:  how much to take  reasons to take this   ALPRAZolam 0.5 MG tablet Commonly known as: XANAX Take 0.25 mg by mouth at bedtime.   atorvastatin 80 MG tablet Commonly known as: LIPITOR Take 1 tablet (80 mg total) by mouth daily at 6 PM.   cefUROXime 500 MG tablet Commonly known as: CEFTIN Take 1 tablet (500 mg total) by mouth 2 (two) times daily for 5 days.   colchicine 0.6 MG tablet Take 0.6 mg by mouth daily as needed (gout attack).   enoxaparin 60 MG/0.6ML injection Commonly known as: LOVENOX Inject 0.6 mLs (60 mg total) into the skin daily.   feeding supplement (GLUCERNA SHAKE) Liqd Take 237 mLs by mouth 2 (two) times daily between meals.   ferrous sulfate 325 (65 FE) MG tablet Take 1 tablet (325 mg total) by mouth 2 (two) times daily with a meal.   furosemide 40 MG tablet Commonly known as: LASIX Take 80 mg by mouth daily.   hydrALAZINE 100 MG tablet Commonly known as: APRESOLINE TAKE 1 TABLET (100 MG TOTAL) BY MOUTH 3 (THREE) TIMES DAILY.   magnesium oxide 400 (241.3 Mg) MG tablet Commonly known as: MAG-OX Take 1 tablet (400 mg total) by mouth daily.   metFORMIN 500 MG tablet Commonly known as: GLUCOPHAGE Take 500 mg by mouth 2 (two) times daily with a meal.   metoprolol tartrate 25 MG tablet Commonly known as: LOPRESSOR Take 0.5 tablets (12.5 mg total) by mouth 2 (two) times daily.    ondansetron 4 MG disintegrating tablet Commonly known as: Zofran ODT Take 1 tablet (4 mg total) by mouth every 8 (eight) hours as needed for nausea or vomiting.   pantoprazole 40 MG tablet Commonly known as: PROTONIX Take 40 mg by mouth 2 (two) times daily.   PROBIOTIC PO Take 1 tablet by mouth daily.   spironolactone 25 MG tablet Commonly known as: ALDACTONE TAKE ONE-HALF TABLET BY MOUTH DAILY What changed: how much to take   tadalafil 20 MG tablet Commonly known as: CIALIS Take 1 tablet (20 mg total) by mouth 2 (two) times daily.   Vitamin B12 1000 MCG Tbcr Take 1,000 mcg by mouth daily with lunch.   vitamin C 500 MG tablet Commonly known as: ASCORBIC ACID Take 500 mg by mouth daily with lunch.   Vitamin D3 25 MCG (1000 UT) Caps Take 1,000 Units by mouth daily with lunch.   warfarin 10 MG tablet Commonly known as: COUMADIN Take as directed. If you are unsure how to take this medication, talk to your nurse or doctor. Original instructions: Take 10 mg today and then continue to take based on your prior schedule. What changed:   medication strength  See the new instructions.      Follow-up Information    [name], [name], MD Follow up.   Specialty: Internal Medicine Why: You must have your INR checked and Lovenox stopped if INR is within normal range.  You will also need the follow blood work in 1 wk: CBC and Bmet.  Contact information: [address] [place] [address] [phone]          Allergies  Allergen Reactions  . Flagyl [Metronidazole] Rash    ALL-OVER BODY RASH  . Coreg [Carvedilol] Other (See Comments)    Terrible cramping in the feet and had a lot of bowel movements, but not diarrhea  . Losartan Swelling    Patient doesn't recall site of swelling  . Verapamil Hives  . Zetia [Ezetimibe] Other (  See Comments)    Reaction not recalled  . Zocor [Simvastatin - High Dose] Other (See Comments)    Reaction not recalled      Procedures/Studies:    Dg Chest Port 1 View  Result Date: 04/04/2019 CLINICAL DATA:  Acute respiratory distress EXAM: PORTABLE CHEST 1 VIEW COMPARISON:  S yesterday FINDINGS: Diffuse interstitial opacity with Kerley lines. Trace pleural effusions. There is cardiomegaly history of CABG. Diaphragm flattening attributed to hyperinflation. IMPRESSION: CHF that has progressed from yesterday. Electronically Signed   By: Monte Fantasia M.D.   On: 04/04/2019 05:35   Dg Chest Port 1 View  Result Date: 04/03/2019 CLINICAL DATA:  Shortness of breath EXAM: PORTABLE CHEST 1 VIEW COMPARISON:  02/28/2019, 11/20/2018 FINDINGS: Post sternotomy changes. Cardiomegaly with vascular congestion and diffuse increased hazy and interstitial opacities suspicious for edema. No large effusion. Aortic atherosclerosis. IMPRESSION: Cardiomegaly with vascular congestion and bilateral interstitial and ground-glass opacity suggestive of pulmonary edema. Electronically Signed   By: Donavan Foil M.D.   On: 04/03/2019 19:09   Dg Abd 2 Views  Result Date: 03/09/2019 CLINICAL DATA:  Nausea, vomiting EXAM: ABDOMEN - 2 VIEW COMPARISON:  CT 02/21/2018 FINDINGS: Cardiomegaly. Calcified granulomas in the lungs bilaterally. Interstitial prominence throughout the lungs could reflect interstitial edema. Prior cholecystectomy. Nonobstructive bowel gas pattern. No organomegaly or free air. No suspicious calcification. Degenerative changes in the lumbar spine and hips, right greater than left. IMPRESSION: No obstruction or free air. Cardiomegaly. Interstitial prominence throughout the lungs could reflect interstitial edema. Electronically Signed   By: Rolm Baptise M.D.   On: 03/09/2019 19:12   Vas [place] Lower Extremity Venous (dvt) (only [facility])  Result Date: 03/23/2019  Lower Venous Study Indications: Pain, and Swelling.  Risk Factors: Patient on Coumadin post Aortic valve replacement. Therapeutic INR 03/05/2019. Comparison Study: No prior  study on file for comparison. Performing Technologist: Sharion Dove RVS  Examination Guidelines: A complete evaluation includes B-mode imaging, spectral Doppler, color Doppler, and power Doppler as needed of all accessible portions of each vessel. Bilateral testing is considered an integral part of a complete examination. Limited examinations for reoccurring indications may be performed as noted.  +-----+---------------+---------+-----------+----------+-------+ RIGHTCompressibilityPhasicitySpontaneityPropertiesSummary +-----+---------------+---------+-----------+----------+-------+ CFV  Full           Yes      Yes                          +-----+---------------+---------+-----------+----------+-------+   +---------+---------------+---------+-----------+----------+-------+ LEFT     CompressibilityPhasicitySpontaneityPropertiesSummary +---------+---------------+---------+-----------+----------+-------+ CFV      Full           Yes      Yes                          +---------+---------------+---------+-----------+----------+-------+ SFJ      Full                                                 +---------+---------------+---------+-----------+----------+-------+ FV Prox  Full                                                 +---------+---------------+---------+-----------+----------+-------+ FV Mid   Full                                                 +---------+---------------+---------+-----------+----------+-------+  FV DistalFull                                                 +---------+---------------+---------+-----------+----------+-------+ PFV      Full                                                 +---------+---------------+---------+-----------+----------+-------+ POP      Full           Yes      Yes                          +---------+---------------+---------+-----------+----------+-------+ PTV      Full                                                  +---------+---------------+---------+-----------+----------+-------+ PERO     Full                                                 +---------+---------------+---------+-----------+----------+-------+ GSV      Full                                                 +---------+---------------+---------+-----------+----------+-------+ SSV      Full                                                 +---------+---------------+---------+-----------+----------+-------+     Summary: Right: No evidence of common femoral vein obstruction. Left: There is no evidence of deep vein thrombosis in the lower extremity.  *See table(s) above for measurements and observations. Electronically signed by Harold Barban MD on 03/23/2019 at 4:38:10 PM.    Final      The results of significant diagnostics from this hospitalization (including imaging, microbiology, ancillary and laboratory) are listed below for reference.     Microbiology: Recent Results (from the past 240 hour(s))  SARS Coronavirus 2 (CEPHEID - Performed in [facility] hospital lab), Hosp Order     Status: None   Collection Time: 04/03/19  3:51 PM   Specimen: Nasopharyngeal Swab  Result Value Ref Range Status   SARS Coronavirus 2 NEGATIVE NEGATIVE Final    Comment: (NOTE) If result is NEGATIVE SARS-CoV-2 target nucleic acids are NOT DETECTED. The SARS-CoV-2 RNA is generally detectable in upper and lower  respiratory specimens during the acute phase of infection. The lowest  concentration of SARS-CoV-2 viral copies this assay can detect is 250  copies / mL. A negative result does not preclude SARS-CoV-2 infection  and should not be used as the sole basis for treatment or other  patient management decisions.  A negative result may occur with  improper specimen collection / handling, submission of specimen other  than nasopharyngeal swab, presence of viral mutation(s) within the  areas targeted by this assay, and  inadequate number of viral copies  (<250 copies / mL). A negative result must be combined with clinical  observations, patient history, and epidemiological information. If result is POSITIVE SARS-CoV-2 target nucleic acids are DETECTED. The SARS-CoV-2 RNA is generally detectable in upper and lower  respiratory specimens dur ing the acute phase of infection.  Positive  results are indicative of active infection with SARS-CoV-2.  Clinical  correlation with patient history and other diagnostic information is  necessary to determine patient infection status.  Positive results do  not rule out bacterial infection or co-infection with other viruses. If result is PRESUMPTIVE POSTIVE SARS-CoV-2 nucleic acids MAY BE PRESENT.   A presumptive positive result was obtained on the submitted specimen  and confirmed on repeat testing.  While 2019 novel coronavirus  (SARS-CoV-2) nucleic acids may be present in the submitted sample  additional confirmatory testing may be necessary for epidemiological  and / or clinical management purposes  to differentiate between  SARS-CoV-2 and other Sarbecovirus currently known to infect humans.  If clinically indicated additional testing with an alternate test  methodology [phone]) is advised. The SARS-CoV-2 RNA is generally  detectable in upper and lower respiratory sp ecimens during the acute  phase of infection. The expected result is Negative. Fact Sheet for Patients:  StrictlyIdeas.no Fact Sheet for Healthcare Providers: BankingDealers.co.za This test is not yet approved or cleared by the [place] FDA and has been authorized for detection and/or diagnosis of SARS-CoV-2 by FDA under an Emergency Use Authorization (EUA).  This EUA will remain in effect (meaning this test can be used) for the duration of the COVID-19 declaration under Section 564(b)(1) of the Act, 21 U.S.C. section 360bbb-3(b)(1), unless the  authorization is terminated or revoked sooner. Performed at [facility], [place] [address] [place], [place] [address]   C difficile quick scan w PCR reflex     Status: None   Collection Time: 04/04/19 10:34 AM   Specimen: STOOL  Result Value Ref Range Status   C Diff antigen NEGATIVE NEGATIVE Final   C Diff toxin NEGATIVE NEGATIVE Final   C Diff interpretation No C. difficile detected.  Final    Comment: Performed at [facility], [place] [address], [place], [place] [address]     Labs: BNP (last 3 results) Recent Labs    07/23/18 1117  BNP 948.5*   Basic Metabolic Panel: Recent Labs  Lab 04/03/19 1456 04/04/19 0821 04/05/19 0346 04/06/19 0334 04/07/19 0239  NA 132* 136 134* 134* 135  K 3.6 3.9 3.5 3.4* 4.4  CL 103 105 102 105 105  CO2 19* 20* 20* 22 21*  GLUCOSE 148* 126* 108* 100* 98  BUN 36* 30* 31* 33* 36*  CREATININE 1.65* 1.46* 1.49* 1.49* 1.57*  CALCIUM 8.5* 8.5* 8.1* 8.3* 8.7*  MG  --   --   --   --  1.6*   Liver Function Tests: Recent Labs  Lab 04/03/19 1456  AST 24  ALT 14  ALKPHOS 75  BILITOT 1.2  PROT 6.6  ALBUMIN 2.9*   Recent Labs  Lab 04/03/19 1456  LIPASE 43   No results for input(s): AMMONIA in the last 168 hours. CBC: Recent Labs  Lab 04/05/19 0731 04/05/19 2155 04/06/19 0947 04/06/19 2243 04/07/19 0925  WBC 2.4* 2.1* 2.7* 2.9* 3.1*  HGB 7.6*  7.4* 7.9* 7.3* 8.0*  HCT 23.3* 22.4* 24.2* 22.4* 24.9*  MCV 95.5 94.9 95.3 94.5 95.4  PLT 151 148* 151 162 151   Cardiac Enzymes: No results for input(s): CKTOTAL, CKMB, CKMBINDEX, TROPONINI in the last 168 hours. BNP: Invalid input(s): POCBNP CBG: Recent Labs  Lab 04/06/19 0619 04/06/19 1144 04/06/19 1707 04/06/19 2119 04/07/19 0623  GLUCAP 146* 162* 125* 187* 107*   D-Dimer No results for input(s): DDIMER in the last 72 hours. Hgb A1c No results for input(s): HGBA1C in the last 72 hours. Lipid Profile No results for input(s): CHOL, HDL, LDLCALC, TRIG, CHOLHDL,  LDLDIRECT in the last 72 hours. Thyroid function studies No results for input(s): TSH, T4TOTAL, T3FREE, THYROIDAB in the last 72 hours.  Invalid input(s): FREET3 Anemia work up No results for input(s): VITAMINB12, FOLATE, FERRITIN, TIBC, IRON, RETICCTPCT in the last 72 hours. Urinalysis    Component Value Date/Time   COLORURINE YELLOW 03/09/2019 2119   APPEARANCEUR CLEAR 03/09/2019 2119   LABSPEC 1.006 03/09/2019 2119   PHURINE 7.0 03/09/2019 2119   GLUCOSEU NEGATIVE 03/09/2019 2119   HGBUR MODERATE (A) 03/09/2019 2119   BILIRUBINUR NEGATIVE 03/09/2019 2119   [place] 03/09/2019 2119   PROTEINUR 30 (A) 03/09/2019 2119   UROBILINOGEN 1.0 10/06/2011 1709   NITRITE NEGATIVE 03/09/2019 2119   LEUKOCYTESUR NEGATIVE 03/09/2019 2119   Sepsis Labs Invalid input(s): PROCALCITONIN,  WBC,  LACTICIDVEN Microbiology Recent Results (from the past 240 hour(s))  SARS Coronavirus 2 (CEPHEID - Performed in [facility] hospital lab), Hosp Order     Status: None   Collection Time: 04/03/19  3:51 PM   Specimen: Nasopharyngeal Swab  Result Value Ref Range Status   SARS Coronavirus 2 NEGATIVE NEGATIVE Final    Comment: (NOTE) If result is NEGATIVE SARS-CoV-2 target nucleic acids are NOT DETECTED. The SARS-CoV-2 RNA is generally detectable in upper and lower  respiratory specimens during the acute phase of infection. The lowest  concentration of SARS-CoV-2 viral copies this assay can detect is 250  copies / mL. A negative result does not preclude SARS-CoV-2 infection  and should not be used as the sole basis for treatment or other  patient management decisions.  A negative result may occur with  improper specimen collection / handling, submission of specimen other  than nasopharyngeal swab, presence of viral mutation(s) within the  areas targeted by this assay, and inadequate number of viral copies  (<250 copies / mL). A negative result must be combined with clinical  observations,  patient history, and epidemiological information. If result is POSITIVE SARS-CoV-2 target nucleic acids are DETECTED. The SARS-CoV-2 RNA is generally detectable in upper and lower  respiratory specimens dur ing the acute phase of infection.  Positive  results are indicative of active infection with SARS-CoV-2.  Clinical  correlation with patient history and other diagnostic information is  necessary to determine patient infection status.  Positive results do  not rule out bacterial infection or co-infection with other viruses. If result is PRESUMPTIVE POSTIVE SARS-CoV-2 nucleic acids MAY BE PRESENT.   A presumptive positive result was obtained on the submitted specimen  and confirmed on repeat testing.  While 2019 novel coronavirus  (SARS-CoV-2) nucleic acids may be present in the submitted sample  additional confirmatory testing may be necessary for epidemiological  and / or clinical management purposes  to differentiate between  SARS-CoV-2 and other Sarbecovirus currently known to infect humans.  If clinically indicated additional testing with an alternate test  methodology [phone]) is advised.  The SARS-CoV-2 RNA is generally  detectable in upper and lower respiratory sp ecimens during the acute  phase of infection. The expected result is Negative. Fact Sheet for Patients:  StrictlyIdeas.no Fact Sheet for Healthcare Providers: BankingDealers.co.za This test is not yet approved or cleared by the [place] FDA and has been authorized for detection and/or diagnosis of SARS-CoV-2 by FDA under an Emergency Use Authorization (EUA).  This EUA will remain in effect (meaning this test can be used) for the duration of the COVID-19 declaration under Section 564(b)(1) of the Act, 21 U.S.C. section 360bbb-3(b)(1), unless the authorization is terminated or revoked sooner. Performed at [facility], [place] [address] [place],  [place] [address]   C difficile quick scan w PCR reflex     Status: None   Collection Time: 04/04/19 10:34 AM   Specimen: STOOL  Result Value Ref Range Status   C Diff antigen NEGATIVE NEGATIVE Final   C Diff toxin NEGATIVE NEGATIVE Final   C Diff interpretation No C. difficile detected.  Final    Comment: Performed at [facility], [place] [address] [place], [place] [address]     Time coordinating discharge in minutes: 73  SIGNED:   [name], MD  [facility] 04/07/2019, 11:00 AM Pager   If 7PM-7AM, please contact night-coverage [email or web address] Password [email or web address]

## 2019-04-07 NOTE — Progress Notes (Signed)
Pt sats dropped to 88% and stayed there. Placed pt back on O2 at 1 L. Sats increased to 94%. Will continue to monitor. Lajoyce Corners, RN

## 2019-04-07 NOTE — Telephone Encounter (Signed)

## 2019-04-07 NOTE — Progress Notes (Signed)
Pt has order for TED hose. Pt measured and hose placed. Pt seems to tolerate them well. Says her legs get hot at times and doesn't know if she will be able to wear them at home. Pt resting and hose in place. Will continue to monitor. Lajoyce Corners, RN

## 2019-04-07 NOTE — Progress Notes (Signed)
Discharge AVS meds take and those due reviewed with pt. Follow up appointments and when to call MD reviewed. Pt with concerns about stopping irbesartan, this RN educated pt, pt voiced understanding. This RN called [facility] for home meds, pharmacy discussed with pt, meds changed to [facility].  D/c IV and TELE, CCMD notified. D/C home per orders.

## 2019-04-07 NOTE — TOC Benefit Eligibility Note (Signed)
Transition of Care [facility]) Benefit Eligibility Note    Patient Details  Name: [name] MRN: [id number] Date of Birth: [birth year]   Medication/Dose: ENOXAPARIN  60 MG SYRINGES  Q12 HR  Covered?: Yes  Tier: (4 DRUG)  Prescription Coverage Preferred Pharmacy: [name] Huger with Person/Company/Phone Number:: [facility]   @ [place] # [phone]  Co-Pay: $95.00  Prior Approval: No  Deductible: Met  Additional Notes: [employer]  Billey Gosling- YES # [phone]    Memory Argue Phone Number: 04/07/2019, 10:35 AM

## 2019-04-08 ENCOUNTER — Other Ambulatory Visit: Payer: Self-pay | Admitting: *Deleted

## 2019-04-08 NOTE — Patient Outreach (Signed)
[place] [facility]) Care Management  04/08/2019  DIEGO DELANCEY 09-26-1941 [id number]   Member recently admitted to hospital 6/25-6/29 for GI bleed, primary MD will complete transition of care assessment.  Call placed to member to follow up on recent discharge, report she has not been feeling well.  She denies any further bleeding but state she has been experiencing vomiting today and diarrhea.  State she is unsure why this is happening.  Reviewed discharge instructions, noted that antibiotics for cellulitis was changed at discharge.  Discussed this may be side effects from antibiotics but also encouraged to discuss concerns with primary MD.  Does not have follow up appointment scheduled at this time, advised to schedule per discharge instructions to have labs drawn.  Has appointment tomorrow to have coumadin levels checked, neighbor will provide transportation.  Inquired about additional support in the home as member lives alone.  She state her son lives in [place] but is usually able to provide transportation to MD appointments.  Daughter lives in [place] and has offered to have her live there but member has refused, would prefer to live independently as long as possible.  Report that her neighbor that is providing transportation to clinic tomorrow stays with her at night and able to run errands.  She does voice concern regarding not having the energy to prepare daily meals.  Discussed the [employer] program through [facility] that provides post hospital meals.  Call was placed in attempt to request meals, but call was unsuccessful.  She is open to being in contact with Education officer, museum for resources.  Report frustration regarding dealing with recurrent medical conditions.  Admits that she is depressed at times and will discuss with primary MD.  Denies any other concerns, advised to contact this care manager with questions.  Will place referral to social worker and will follow up within the next 2  weeks.  [facility] CM Care Plan Problem One     Most Recent Value  Care Plan Problem One  Risk for hospitalization related to anemia as evidenced by recent hospital admission  Role Documenting the Problem One  Care Management [place] for Problem One  Active  [place] Term Goal   Member will not be readmitted to hospital within the next 31 days  [facility] Long Term Goal Start Date  04/08/19 [Not met, date reset]  Interventions for Problem One Long Term Goal  New discharge instructions reviewed with member, including follow up with home health as well as medication management  [facility] CM Short Term Goal #1   Member will report taking medications as prescribed over the next 4 weeks  [facility] CM Short Term Goal #1 Start Date  03/07/19  [facility] CM Short Term Goal #1 Met Date  04/08/19  [facility] CM Short Term Goal #2   Member will call to schedule follow up with primary MD within the next 2 weeks  [facility] CM Short Term Goal #2 Start Date  04/08/19  Interventions for Short Term Goal #2  Advised to contact primary MD office to schedule post hospital follow up  [facility] CM Short Term Goal #3  Member will be able to verbalize plan for meal support within the next 2 weeks  [facility] CM Short Term Goal #3 Start Date  04/08/19  Interventions for Short [place] Goal #3  Referral placed to [facility] social worker for food/meal assistance.     [name], [place], MSN [place] [phone]

## 2019-04-09 ENCOUNTER — Other Ambulatory Visit: Payer: Self-pay

## 2019-04-09 ENCOUNTER — Ambulatory Visit: Payer: 59 | Admitting: Podiatry

## 2019-04-09 ENCOUNTER — Ambulatory Visit (INDEPENDENT_AMBULATORY_CARE_PROVIDER_SITE_OTHER): Payer: Medicare HMO | Admitting: Pharmacist

## 2019-04-09 DIAGNOSIS — Z952 Presence of prosthetic heart valve: Secondary | ICD-10-CM

## 2019-04-09 DIAGNOSIS — Z7901 Long term (current) use of anticoagulants: Secondary | ICD-10-CM | POA: Diagnosis not present

## 2019-04-09 DIAGNOSIS — I4891 Unspecified atrial fibrillation: Secondary | ICD-10-CM | POA: Diagnosis not present

## 2019-04-09 LAB — POCT INR: INR: 3.4 — AB (ref 2.0–3.0)

## 2019-04-10 ENCOUNTER — Other Ambulatory Visit: Payer: Self-pay

## 2019-04-10 NOTE — Patient Outreach (Addendum)
[facility]) Care Management  04/10/2019  [name] January 26, 1941 [id number]  Successful outreach to patient regarding social work referral for prepared meals.  Patient discharged from hospital on 04/07/19.  She reports today that she is feeling "a little better" but she does not feel well enough to prepare meals.  Reports eating a lot of tomato sandwiches.  BSW informed her of [place] and she consented to referral. BSW informed her that meal delivery program will likely only last until 05/10/19.  Patient stated that she hopes to feeling good enough to prepare her own meals by then.   BSW submitted referral and patient will receive first delivery next week.  Closing case.   Ronn Melena, BSW Social Worker [phone]

## 2019-04-14 DIAGNOSIS — I482 Chronic atrial fibrillation, unspecified: Secondary | ICD-10-CM | POA: Diagnosis not present

## 2019-04-14 DIAGNOSIS — K259 Gastric ulcer, unspecified as acute or chronic, without hemorrhage or perforation: Secondary | ICD-10-CM | POA: Diagnosis not present

## 2019-04-14 DIAGNOSIS — I5032 Chronic diastolic (congestive) heart failure: Secondary | ICD-10-CM | POA: Diagnosis not present

## 2019-04-14 DIAGNOSIS — R5381 Other malaise: Secondary | ICD-10-CM | POA: Diagnosis not present

## 2019-04-14 DIAGNOSIS — D500 Iron deficiency anemia secondary to blood loss (chronic): Secondary | ICD-10-CM | POA: Diagnosis not present

## 2019-04-15 ENCOUNTER — Encounter (HOSPITAL_COMMUNITY): Payer: Medicare HMO | Admitting: Cardiology

## 2019-04-15 ENCOUNTER — Encounter: Payer: Self-pay | Admitting: Cardiovascular Disease

## 2019-04-15 ENCOUNTER — Other Ambulatory Visit (HOSPITAL_COMMUNITY): Payer: Medicare HMO

## 2019-04-15 ENCOUNTER — Other Ambulatory Visit (HOSPITAL_COMMUNITY): Payer: Self-pay | Admitting: Cardiology

## 2019-04-15 ENCOUNTER — Telehealth: Payer: Self-pay

## 2019-04-15 DIAGNOSIS — D500 Iron deficiency anemia secondary to blood loss (chronic): Secondary | ICD-10-CM | POA: Diagnosis not present

## 2019-04-15 DIAGNOSIS — Z7901 Long term (current) use of anticoagulants: Secondary | ICD-10-CM | POA: Diagnosis not present

## 2019-04-15 NOTE — Telephone Encounter (Signed)
lmom for prescreen  

## 2019-04-17 ENCOUNTER — Institutional Professional Consult (permissible substitution): Payer: Medicare HMO | Admitting: Pulmonary Disease

## 2019-04-17 ENCOUNTER — Other Ambulatory Visit: Payer: Self-pay | Admitting: *Deleted

## 2019-04-17 NOTE — Patient Outreach (Signed)
[place] [facility]) Care Management  04/17/2019  [name] 1941-08-03 [id number]   Case discussed with multidisciplinary team due to readmission to hospital within 30 days.  Currently member managing care at home, no further bleeding.  Request made to place referral to [facility] pharmacist to review medications for potential interactions that may increase risk of GI bleed.  Will follow up with member next week as planned, will verify member has emergency alert system in place as she lives alone.  [name], [place], MSN [place] [phone]

## 2019-04-18 ENCOUNTER — Telehealth: Payer: Self-pay | Admitting: *Deleted

## 2019-04-18 ENCOUNTER — Other Ambulatory Visit (HOSPITAL_COMMUNITY): Payer: Self-pay

## 2019-04-18 MED ORDER — TADALAFIL 20 MG PO TABS: 20.0000 mg | ORAL_TABLET | Freq: Two times a day (BID) | ORAL | 0 refills | Status: DC

## 2019-04-18 NOTE — Telephone Encounter (Signed)
This RN spoke with pt's daughter- [name]- who states she mom feels she needs to have labs and infusion ( blood or iron ) sooner then currently scheduled visit on 7/23.  [name] also states her mom is " mentally very tired and wants to know how to fix her problem so she is not always so physically exhausted and then something is done "  This RN discussed primary roll of this office is to monitor hemoglobin and iron levels and replace as needed. This RN can offer to monitor labs frequently and have pt scheduled for infusion of iron or blood as needed which may allow for more preventive care vs waiting until pt feels she is exhausted.  [name] stated understanding and appreciation of possible intervention.  Appointment made for this coming Monday for lab and visit for possible IV therapy needs.

## 2019-04-18 NOTE — Telephone Encounter (Signed)
VM left by pt's daughter- Charlies Silvers- stating " I am calling to see if the records were sent to your office regarding health issues and when will her appointment be ?"  Return call given as [phone].  This RN returned call and obtained identified VM- message left stating current appointment is 05/05/2019. Unsure what records she is referring to ( pt was recently inpt which is in Epic )- and if having new symptoms to please call and speak to a nurse, return office number given for return call if needed.

## 2019-04-21 ENCOUNTER — Inpatient Hospital Stay: Payer: Medicare HMO

## 2019-04-21 ENCOUNTER — Inpatient Hospital Stay: Payer: Medicare HMO | Attending: Oncology | Admitting: Adult Health

## 2019-04-21 ENCOUNTER — Encounter: Payer: Self-pay | Admitting: Adult Health

## 2019-04-21 ENCOUNTER — Other Ambulatory Visit: Payer: Self-pay

## 2019-04-21 VITALS — BP 168/38 | HR 88 | Temp 99.1°F | Resp 18 | Ht 61.0 in | Wt 123.5 lb

## 2019-04-21 DIAGNOSIS — D631 Anemia in chronic kidney disease: Secondary | ICD-10-CM | POA: Diagnosis not present

## 2019-04-21 DIAGNOSIS — E1122 Type 2 diabetes mellitus with diabetic chronic kidney disease: Secondary | ICD-10-CM | POA: Diagnosis not present

## 2019-04-21 DIAGNOSIS — I4891 Unspecified atrial fibrillation: Secondary | ICD-10-CM | POA: Diagnosis not present

## 2019-04-21 DIAGNOSIS — N189 Chronic kidney disease, unspecified: Secondary | ICD-10-CM | POA: Insufficient documentation

## 2019-04-21 DIAGNOSIS — D696 Thrombocytopenia, unspecified: Secondary | ICD-10-CM

## 2019-04-21 DIAGNOSIS — D594 Other nonautoimmune hemolytic anemias: Secondary | ICD-10-CM

## 2019-04-21 DIAGNOSIS — D508 Other iron deficiency anemias: Secondary | ICD-10-CM

## 2019-04-21 DIAGNOSIS — I739 Peripheral vascular disease, unspecified: Secondary | ICD-10-CM

## 2019-04-21 LAB — CMP (CANCER CENTER ONLY)
ALT: 9 U/L (ref 0–44)
AST: 21 U/L (ref 15–41)
Albumin: 2.7 g/dL — ABNORMAL LOW (ref 3.5–5.0)
Alkaline Phosphatase: 77 U/L (ref 38–126)
Anion gap: 9 (ref 5–15)
BUN: 48 mg/dL — ABNORMAL HIGH (ref 8–23)
CO2: 18 mmol/L — ABNORMAL LOW (ref 22–32)
Calcium: 8 mg/dL — ABNORMAL LOW (ref 8.9–10.3)
Chloride: 105 mmol/L (ref 98–111)
Creatinine: 1.57 mg/dL — ABNORMAL HIGH (ref 0.44–1.00)
GFR, Est AFR Am: 36 mL/min — ABNORMAL LOW (ref >60)
GFR, Estimated: 31 mL/min — ABNORMAL LOW (ref >60)
Glucose, Bld: 158 mg/dL — ABNORMAL HIGH (ref 70–99)
Potassium: 4.3 mmol/L (ref 3.5–5.1)
Sodium: 132 mmol/L — ABNORMAL LOW (ref 135–145)
Total Bilirubin: 0.5 mg/dL (ref 0.3–1.2)
Total Protein: 6.9 g/dL (ref 6.5–8.1)

## 2019-04-21 LAB — RETICULOCYTES
Immature Retic Fract: 9.8 % (ref 2.3–15.9)
RBC.: 2.43 MIL/uL — ABNORMAL LOW (ref 3.87–5.11)
Retic Count, Absolute: 60.3 10*3/uL (ref 19.0–186.0)
Retic Ct Pct: 2.5 % (ref 0.4–3.1)

## 2019-04-21 LAB — CBC WITH DIFFERENTIAL/PLATELET
Abs Immature Granulocytes: 0.01 10*3/uL (ref 0.00–0.07)
Basophils Absolute: 0 10*3/uL (ref 0.0–0.1)
Basophils Relative: 1 %
Eosinophils Absolute: 0.1 10*3/uL (ref 0.0–0.5)
Eosinophils Relative: 2 %
HCT: 23.2 % — ABNORMAL LOW (ref 36.0–46.0)
Hemoglobin: 7.4 g/dL — ABNORMAL LOW (ref 12.0–15.0)
Immature Granulocytes: 0 %
Lymphocytes Relative: 13 %
Lymphs Abs: 0.4 10*3/uL — ABNORMAL LOW (ref 0.7–4.0)
MCH: 30.1 pg (ref 26.0–34.0)
MCHC: 31.9 g/dL (ref 30.0–36.0)
MCV: 94.3 fL (ref 80.0–100.0)
Monocytes Absolute: 0.1 10*3/uL (ref 0.1–1.0)
Monocytes Relative: 4 %
Neutro Abs: 2.3 10*3/uL (ref 1.7–7.7)
Neutrophils Relative %: 80 %
Platelets: 164 10*3/uL (ref 150–400)
RBC: 2.46 MIL/uL — ABNORMAL LOW (ref 3.87–5.11)
RDW: 15 % (ref 11.5–15.5)
WBC: 2.8 10*3/uL — ABNORMAL LOW (ref 4.0–10.5)
nRBC: 0 % (ref 0.0–0.2)

## 2019-04-21 LAB — IRON AND TIBC
Iron: 37 ug/dL — ABNORMAL LOW (ref 41–142)
Saturation Ratios: 15 % — ABNORMAL LOW (ref 21–57)
TIBC: 248 ug/dL (ref 236–444)
UIBC: 211 ug/dL (ref 120–384)

## 2019-04-21 LAB — FERRITIN: Ferritin: 251 ng/mL (ref 11–307)

## 2019-04-21 LAB — SAVE SMEAR(SSMR), FOR PROVIDER SLIDE REVIEW

## 2019-04-21 NOTE — Progress Notes (Signed)
[facility]  [phone] Fax:([phone]) [phone]    ID: KALEEAH GINGERICH DOB: 06-16-1941  MR#: [id number]  ZHY#:[id number]  Patient Care Team: [name], MD as PCP - General (Internal Medicine) Lorretta Harp, MD as PCP - Cardiology (Cardiology) [name], RN as [employer] Magrinat, [name], MD as Consulting Physician (Hematology and Oncology) Arta Silence, MD as Consulting Physician (Gastroenterology) [name], MD as Consulting Physician (Cardiology) Marzetta Board, DPM as Consulting Physician (Podiatry) Gavin Pound, MD as Consulting Physician (Rheumatology) [name], [facility] (Pharmacist) OTHER MD:   CHIEF COMPLAINT: anemia and thrombocytopenia  CURRENT TREATMENT: Feraheme, [aranesp]   INTERVAL HISTORY: [name] was seen today for follow-up and treatment of her anemia and thrombocytopenia.    She recently received Feraheme for her iron deficiency anemia on 03/01/2019 and 03/20/2019.  Her appointment with rheumatology unfortunately had to be postponed and has been moved to 03/27/2019  Since her last appointment, [name] was admitted to the hospital from 04/03/2019 through 04/07/2019 for acute GI bleed, hematochezia, and anemia with a hemoglobin of 7 treated with one unit of PRBCs.  She also had acute respiratory failure due to acute on diastolic CHF requiring BiPap.  She was aggressively diuresed on admission with IV lasix and oral aldactone.  She also had cellulitis, on keflex, and then transitioned to Ancef.  She was given TEDs to wear.  She is taking Coumadin for her paroxysmal Atrial Fibrillation.    REVIEW OF SYSTEMS: [name] is starting to feel better over the past three days since her admission to the hospital.  [name] was evaluated by rheumatology, had a lot of lab testing done, and is returning on 7/28 for follow up.  [name] notes her bleeding ulcer has improved. She notes that she has phlegm  that is produced when she clears her throat.  This has some blood tinge to it.  This happens 0-3 times per day.  It varies.  Typically it is just once per day.  Sherrice has no cough or shortness of breath.    [name]'s activity level is "not too good"  She notes she is able to use the restroom, dress herself, shower, and cook small quick meals for herself.  She notes she has been in the bed a lot.  [name]'s children help with grocery shopping.  Her children live 30-40 minutes away, which does make things challenging.  Her neighbor helps with cleaning due to this.  Her son brings her to her appointments.  Otherwise, a detailed ROS was non contributory today.     HISTORY OF CURRENT ILLNESS: From the original consult note:  [name] is an 78 y.o. female from [place], [place].  She has a past medical history significant forPAD s/p stenting; moderate to severe pulmonary HTN; afib and AVR on Coumadin; HTN; HLD; DM; and CAD s/p CABG. the patient was admitted to the hospital for anemia.  She was having increasing fatigue and shortness of breath and had routine lab work drawn which showed a hemoglobin of 6.6.  Due to her abnormal lab work, she was advised to come to the emergency room for admission.  She was also admitted in February 2020 for symptomatic anemia.  She reports she has been anemic for at least a year and has been taking oral iron.  She underwent a colonoscopy and EGD on 01/22/2018.  The colonoscopy showed diverticulosis in the sigmoid colon and descending colon, internal hemorrhoids.  The upper endoscopy  was normal except for erythematous mucosa in the antrum.  She had a repeat EGD on 11/21/2018 that showed a few minimally oozing( with scope trauma) superficial gastric ulcers with pigmented material were found in the gastric body. The largest lesion was 4 mm in largest dimension.  She underwent another upper endoscopy on 02/27/2019 which showed patchy mildly friable mucosa with contact  bleeding was found in the gastric fundus, in the gastric body and in the gastric antrum.  The patient is maintained on warfarin due to A. fib and a mechanical aortic valve.  Review of the patient's chart shows that she has been anemic dating back to at least December thousand 12 (these are the earliest labs available to me).  Her highest hemoglobin was 11.6 in December 2012.  I also note that the patient has had intermittent mild leukopenia adding back to at least April 2019.  Her lowest white blood cell count was 2.6 in April 2019.  Since admission, the patient has received 1 unit of packed red blood cells.  She is currently on heparin for her procedure with plan to bridge back to Coumadin.  Stool for occult blood was negative x1 on 02/24/2019.  Hematology was asked see the patient to make recommendations regarding her anemia.  [name] subsequent history is as detailed below.   PAST MEDICAL HISTORY: Past Medical History:  Diagnosis Date   Aortic atherosclerosis ([place]) 01/23/2018   Atrial fibrillation (HCC)    Carotid artery disease ([place])    Cholelithiasis 01/23/2018   Chronic anticoagulation    Coronary artery disease    status post coronary artery bypass grafting times 07/10/2004   Diabetes mellitus    Hypercholesteremia    Hypertension    Mechanical heart valve present    H. aortic valve replacement at the time of bypass surgery October 2005   Moderate to severe pulmonary hypertension ([place])    Peripheral arterial disease ([place])    history of left common iliac artery PTA and stenting for a chronic total occlusion 08/26/01     PAST SURGICAL HISTORY: Past Surgical History:  Procedure Laterality Date   AORTIC VALVE REPLACEMENT     CARDIAC CATHETERIZATION  11/10/2004   40% right common illiac, 70% in stent restenosis of distal left common illiac,    CARDIAC CATHETERIZATION  05/18/2004   LAD 50-70% midstenosis, RCA dominant w/50% stenosis, 50% Right common Illiac  artery ostial stenosis, 90% in stent restenosis within midportion of left common illiac stent   Carotid Duplex  03/12/2012   RSA-elev. velocities suggestive of a 50-69% diameter reduction, Right&Left Bulb/Prox ICA-mild-mod.fibrous plaqueelevating Velocities abnormal study.   CHOLECYSTECTOMY N/A 03/01/2018   Procedure: LAPAROSCOPIC CHOLECYSTECTOMY;  Surgeon: Judeth Horn, MD;  Location: [facility];  Service: General;  Laterality: N/A;   COLONOSCOPY WITH PROPOFOL N/A 01/22/2018   Procedure: COLONOSCOPY WITH PROPOFOL;  Surgeon: Wilford Corner, MD;  Location: [facility];  Service: Endoscopy;  Laterality: N/A;   ESOPHAGOGASTRODUODENOSCOPY (EGD) WITH PROPOFOL N/A 01/22/2018   Procedure: ESOPHAGOGASTRODUODENOSCOPY (EGD) WITH PROPOFOL;  Surgeon: Wilford Corner, MD;  Location: [facility];  Service: Endoscopy;  Laterality: N/A;   ESOPHAGOGASTRODUODENOSCOPY (EGD) WITH PROPOFOL N/A 11/21/2018   Procedure: ESOPHAGOGASTRODUODENOSCOPY (EGD) WITH PROPOFOL;  Surgeon: [name], MD;  Location: [facility];  Service: Gastroenterology;  Laterality: N/A;   ESOPHAGOGASTRODUODENOSCOPY (EGD) WITH PROPOFOL Left 02/27/2019   Procedure: ESOPHAGOGASTRODUODENOSCOPY (EGD) WITH PROPOFOL;  Surgeon: Arta Silence, MD;  [facility]COPY;  Service: Endoscopy;  Laterality: Left;   GIVENS CAPSULE STUDY N/A 11/21/2018  Procedure: GIVENS CAPSULE STUDY;  Surgeon: [name], MD;  Location: [facility];  Service: Gastroenterology;  Laterality: N/A;  To be deployed during EGD   Lower Ext. Duplex  03/12/2012   Right Proximal CIA- vessel narrowing w/elevated velocities 0-49% diameter reduction. Right SFA-mild mixed density plaque throughout vessel.   NM MYOCAR PERF WALL MOTION  05/19/2010   protocol: Persantine, post stress EF 65%, negative for ischemia, low risk scan   RIGHT HEART CATH N/A 06/27/2018   Procedure: RIGHT HEART CATH;  Surgeon: [name], MD;  Location: [facility];  Service:  Cardiovascular;  Laterality: N/A;   TRANSTHORACIC ECHOCARDIOGRAM  08/29/2012   Moderately calcified annulus of mitral valve, moderate regurg. of both mitral valve and tricuspid valve.      FAMILY HISTORY: Family History  Problem Relation Age of Onset   Breast cancer Neg Hx     GYNECOLOGIC HISTORY:  No LMP recorded. Patient has had a hysterectomy. Menarche:  years old Age at first live birth:  I[place] P: 1 LMP:  Contraceptive:  HRT:   Hysterectomy?: yes BSO?:    SOCIAL HISTORY: (Current as of 03/07/2019) Janise is widowed.  She lives alone here in [place], [place].  She has 3 children that live locally.  She smoked three quarters of a pack of cigarettes daily for 50 years.  She quit in 2005.  Denies alcohol use.   ADVANCED DIRECTIVES: Not in place    HEALTH MAINTENANCE: Social History   Tobacco Use   Smoking status: Former Smoker    Packs/day: 1.00    Years: 30.00    Pack years: 30.00    Types: Cigarettes   Smokeless tobacco: Never Used  Substance Use Topics   Alcohol use: No    Alcohol/week: 0.0 standard drinks   Drug use: No    Colonoscopy: EGD on 02/27/2019 under Dr. Paulita Fujita found some friable gastric mucosa.  Colonoscopy under Dr. Michail Sermon 01/22/2018 showed significant diverticular disease  PAP:   Bone density:  Mammography: 02/25/2017, breast density category B.  Allergies  Allergen Reactions   Flagyl [Metronidazole] Rash    ALL-OVER BODY RASH   Coreg [Carvedilol] Other (See Comments)    Terrible cramping in the feet and had a lot of bowel movements, but not diarrhea   Losartan Swelling    Patient doesn't recall site of swelling   Verapamil Hives   Zetia [Ezetimibe] Other (See Comments)    Reaction not recalled   Zocor [Simvastatin - High Dose] Other (See Comments)    Reaction not recalled    Current Outpatient Medications  Medication Sig Dispense Refill   acetaminophen (TYLENOL) 325 MG tablet Take 2 tablets (650 mg total) by  mouth every 6 (six) hours as needed for mild pain, fever or headache (for pain or headaches). (Patient taking differently: Take 325 mg by mouth every 6 (six) hours as needed for fever or headache (pain). )     ALPRAZolam (XANAX) 0.5 MG tablet Take 0.25 mg by mouth at bedtime.      atorvastatin (LIPITOR) 80 MG tablet Take 1 tablet (80 mg total) by mouth daily at 6 PM.     Cholecalciferol (VITAMIN D3) 1000 UNITS CAPS Take 1,000 Units by mouth daily with lunch.      colchicine 0.6 MG tablet Take 0.6 mg by mouth daily as needed (gout attack).     Cyanocobalamin (VITAMIN B12) 1000 MCG TBCR Take 1,000 mcg by mouth daily with lunch.      enoxaparin (LOVENOX)  60 MG/0.6ML injection Inject 0.6 mLs (60 mg total) into the skin daily. 4.2 mL 0   feeding supplement, GLUCERNA SHAKE, (GLUCERNA SHAKE) LIQD Take 237 mLs by mouth 2 (two) times daily between meals. 237 mL 30   ferrous sulfate 325 (65 FE) MG tablet Take 1 tablet (325 mg total) by mouth 2 (two) times daily with a meal. 60 tablet 3   furosemide (LASIX) 40 MG tablet Take 80 mg by mouth daily.     hydrALAZINE (APRESOLINE) 100 MG tablet TAKE 1 TABLET (100 MG TOTAL) BY MOUTH 3 (THREE) TIMES DAILY. 270 tablet 1   magnesium oxide (MAG-OX) 400 (241.3 Mg) MG tablet Take 1 tablet (400 mg total) by mouth daily.     metFORMIN (GLUCOPHAGE) 500 MG tablet Take 500 mg by mouth 2 (two) times daily with a meal.      metoprolol tartrate (LOPRESSOR) 25 MG tablet Take 0.5 tablets (12.5 mg total) by mouth 2 (two) times daily. 60 tablet 0   ondansetron (ZOFRAN ODT) 4 MG disintegrating tablet Take 1 tablet (4 mg total) by mouth every 8 (eight) hours as needed for nausea or vomiting. 6 tablet 0   pantoprazole (PROTONIX) 40 MG tablet Take 40 mg by mouth 2 (two) times daily.      Probiotic Product (PROBIOTIC PO) Take 1 tablet by mouth daily.     spironolactone (ALDACTONE) 25 MG tablet TAKE ONE-HALF TABLET BY MOUTH DAILY (Patient taking differently: Take 25 mg by  mouth daily. ) 45 tablet 4   tadalafil (CIALIS) 20 MG tablet Take 1 tablet (20 mg total) by mouth 2 (two) times daily. 180 tablet 0   vitamin C (ASCORBIC ACID) 500 MG tablet Take 500 mg by mouth daily with lunch.     warfarin (COUMADIN) 10 MG tablet Take 10 mg today and then continue to take based on your prior schedule.     No current facility-administered medications for this visit.      OBJECTIVE:   Vitals:   04/21/19 1052  BP: (!) 168/38  Pulse: 88  Resp: 18  Temp: 99.1 F (37.3 C)  SpO2: 100%     Body mass index is 23.34 kg/m.   Wt Readings from Last 3 Encounters:  04/21/19 123 lb 8 oz (56 kg)  04/07/19 125 lb 6.4 oz (56.9 kg)  03/24/19 128 lb (58.1 kg)      ECOG FS:1 - Symptomatic but completely ambulatory GENERAL: Patient is a chronically ill appearing older woman in no acute distress, examined in wheelchair HEENT:  Sclerae anicteric.  Oropharynx clear and moist. No ulcerations or evidence of oropharyngeal candidiasis. Neck is supple.  NODES:  No cervical, supraclavicular, or axillary lymphadenopathy palpated.  LUNGS:  Clear to auscultation bilaterally.  No wheezes or rhonchi. HEART:  Regular rate and rhythm. No murmur appreciated. ABDOMEN:  Soft, nontender.  Positive, normoactive bowel sounds. No organomegaly palpated. MSK:  No focal spinal tenderness to palpation. Full range of motion bilaterally in the upper extremities. EXTREMITIES:  2+ left leg edema, no erythema right leg with scant edema SKIN:  Clear with no obvious rashes or skin changes. No nail dyscrasia. NEURO:  Nonfocal. Well oriented.  Appropriate affect.      LAB RESULTS:  CMP     Component Value Date/Time   NA 132 (L) 04/21/2019 1028   NA 137 12/06/2017 1436   K 4.3 04/21/2019 1028   CL 105 04/21/2019 1028   CO2 18 (L) 04/21/2019 1028   GLUCOSE 158 (H) 04/21/2019 1028  BUN 48 (H) 04/21/2019 1028   BUN 20 12/06/2017 1436   CREATININE 1.57 (H) 04/21/2019 1028   CALCIUM 8.0 (L)  04/21/2019 1028   PROT 6.9 04/21/2019 1028   ALBUMIN 2.7 (L) 04/21/2019 1028   AST 21 04/21/2019 1028   ALT 9 04/21/2019 1028   ALKPHOS 77 04/21/2019 1028   BILITOT 0.5 04/21/2019 1028   GFRNONAA 31 (L) 04/21/2019 1028   GFRAA 36 (L) 04/21/2019 1028    No results found for: TOTALPROTELP, ALBUMINELP, A1GS, A2GS, BETS, BETA2SER, GAMS, MSPIKE, SPEI  No results found for: KPAFRELGTCHN, LAMBDASER, KAPLAMBRATIO  Lab Results  Component Value Date   WBC 2.8 (L) 04/21/2019   NEUTROABS 2.3 04/21/2019   HGB 7.4 (L) 04/21/2019   HCT 23.2 (L) 04/21/2019   MCV 94.3 04/21/2019   PLT 164 04/21/2019    @LASTCHEMISTRY @  No results found for: LABCA2  No components found for: GHWEXH371  No results for input(s): INR in the last 168 hours.  No results found for: LABCA2  No results found for: IRC789  No results found for: FYB017  No results found for: PZW258  No results found for: CA2729  No components found for: HGQUANT  No results found for: CEA1 / No results found for: CEA1   No results found for: AFPTUMOR  No results found for: CHROMOGRNA  No results found for: PSA1  Appointment on 04/21/2019  Component Date Value Ref Range Status   Sodium 04/21/2019 132* 135 - 145 mmol/L Final   Potassium 04/21/2019 4.3  3.5 - 5.1 mmol/L Final   Chloride 04/21/2019 105  98 - 111 mmol/L Final   CO2 04/21/2019 18* 22 - 32 mmol/L Final   Glucose, Bld 04/21/2019 158* 70 - 99 mg/dL Final   BUN 04/21/2019 48* 8 - 23 mg/dL Final   Creatinine 04/21/2019 1.57* 0.44 - 1.00 mg/dL Final   Calcium 04/21/2019 8.0* 8.9 - 10.3 mg/dL Final   Total Protein 04/21/2019 6.9  6.5 - 8.1 g/dL Final   Albumin 04/21/2019 2.7* 3.5 - 5.0 g/dL Final   AST 04/21/2019 21  15 - 41 U/L Final   ALT 04/21/2019 9  0 - 44 U/L Final   Alkaline Phosphatase 04/21/2019 77  38 - 126 U/L Final   Total Bilirubin 04/21/2019 0.5  0.3 - 1.2 mg/dL Final   GFR, Est Non Af Am 04/21/2019 31* >60 mL/min Final   GFR,  Est AFR Am 04/21/2019 36* >60 mL/min Final   Anion gap 04/21/2019 9  5 - 15 Final   Performed at [facility], [place] [address] [place], [place] [address]   Smear Review 04/21/2019 SMEAR STAINED AND AVAILABLE FOR REVIEW   Final   Performed at [facility], [place] [address], [place], [place] [address]   Retic Ct Pct 04/21/2019 2.5  0.4 - 3.1 % Final   RBC. 04/21/2019 2.43* 3.87 - 5.11 MIL/uL Final   Retic Count, Absolute 04/21/2019 60.3  19.0 - 186.0 K/uL Final   Immature Retic Fract 04/21/2019 9.8  2.3 - 15.9 % Final   Performed at [facility], [place] [address] [place], [place] [address]   WBC 04/21/2019 2.8* 4.0 - 10.5 K/uL Final   RBC 04/21/2019 2.46* 3.87 - 5.11 MIL/uL Final   Hemoglobin 04/21/2019 7.4* 12.0 - 15.0 g/dL Final   HCT 04/21/2019 23.2* 36.0 - 46.0 % Final   MCV 04/21/2019 94.3  80.0 - 100.0 fL Final   MCH 04/21/2019 30.1  26.0 - 34.0 pg Final   MCHC 04/21/2019 31.9  30.0 - 36.0 g/dL Final   RDW 04/21/2019 15.0  11.5 - 15.5 % Final   Platelets 04/21/2019 164  150 - 400 K/uL Final   nRBC 04/21/2019 0.0  0.0 - 0.2 % Final   Neutrophils Relative % 04/21/2019 80  % Final   Neutro Abs 04/21/2019 2.3  1.7 - 7.7 K/uL Final   Lymphocytes Relative 04/21/2019 13  % Final   Lymphs Abs 04/21/2019 0.4* 0.7 - 4.0 K/uL Final   Monocytes Relative 04/21/2019 4  % Final   Monocytes Absolute 04/21/2019 0.1  0.1 - 1.0 K/uL Final   Eosinophils Relative 04/21/2019 2  % Final   Eosinophils Absolute 04/21/2019 0.1  0.0 - 0.5 K/uL Final   Basophils Relative 04/21/2019 1  % Final   Basophils Absolute 04/21/2019 0.0  0.0 - 0.1 K/uL Final   Immature Granulocytes 04/21/2019 0  % Final   Abs Immature Granulocytes 04/21/2019 0.01  0.00 - 0.07 K/uL Final   Performed at [facility], [place] [address] [place], [place] [address]    (this displays the last labs from the last 3  days)  No results found for: TOTALPROTELP, ALBUMINELP, A1GS, A2GS, BETS, BETA2SER, GAMS, MSPIKE, SPEI (this displays SPEP labs)  No results found for: KPAFRELGTCHN, LAMBDASER, KAPLAMBRATIO (kappa/lambda light chains)  No results found for: HGBA, HGBA2QUANT, HGBFQUANT, HGBSQUAN (Hemoglobinopathy evaluation)   Lab Results  Component Value Date   LDH 197 (H) 02/27/2019    Lab Results  Component Value Date   IRON 56 02/13/2019   TIBC 389 02/13/2019   IRONPCTSAT 14 02/13/2019   (Iron and TIBC)  Lab Results  Component Value Date   FERRITIN 338 (H) 03/20/2019    Urinalysis    Component Value Date/Time   COLORURINE YELLOW 03/09/2019 2119   APPEARANCEUR CLEAR 03/09/2019 2119   LABSPEC 1.006 03/09/2019 2119   PHURINE 7.0 03/09/2019 2119   GLUCOSEU NEGATIVE 03/09/2019 2119   [place] (A) 03/09/2019 2119   [place] 03/09/2019 2119   [place] 03/09/2019 2119   PROTEINUR 30 (A) 03/09/2019 2119   UROBILINOGEN 1.0 10/06/2011 1709   NITRITE NEGATIVE 03/09/2019 2119   LEUKOCYTESUR NEGATIVE 03/09/2019 2119     STUDIES:  Dg Chest Port 1 View  Result Date: 04/04/2019 CLINICAL DATA:  Acute respiratory distress EXAM: PORTABLE CHEST 1 VIEW COMPARISON:  S yesterday FINDINGS: Diffuse interstitial opacity with Kerley lines. Trace pleural effusions. There is cardiomegaly history of CABG. Diaphragm flattening attributed to hyperinflation. IMPRESSION: CHF that has progressed from yesterday. Electronically Signed   By: Monte Fantasia M.D.   On: 04/04/2019 05:35   Dg Chest Port 1 View  Result Date: 04/03/2019 CLINICAL DATA:  Shortness of breath EXAM: PORTABLE CHEST 1 VIEW COMPARISON:  02/28/2019, 11/20/2018 FINDINGS: Post sternotomy changes. Cardiomegaly with vascular congestion and diffuse increased hazy and interstitial opacities suspicious for edema. No large effusion. Aortic atherosclerosis. IMPRESSION: Cardiomegaly with vascular congestion and bilateral  interstitial and ground-glass opacity suggestive of pulmonary edema. Electronically Signed   By: Donavan Foil M.D.   On: 04/03/2019 19:09   Vas [place] Lower Extremity Venous (dvt) (only [facility])  Result Date: 03/23/2019  Lower Venous Study Indications: Pain, and Swelling.  Risk Factors: Patient on Coumadin post Aortic valve replacement. Therapeutic INR 03/05/2019. Comparison Study: No prior study on file for comparison. Performing Technologist: Sharion Dove RVS  Examination Guidelines: A complete evaluation includes B-mode imaging, spectral Doppler, color  Doppler, and power Doppler as needed of all accessible portions of each vessel. Bilateral testing is considered an integral part of a complete examination. Limited examinations for reoccurring indications may be performed as noted.  +-----+---------------+---------+-----------+----------+-------+  RIGHT Compressibility Phasicity Spontaneity Properties Summary  +-----+---------------+---------+-----------+----------+-------+  CFV   Full            Yes       Yes                             +-----+---------------+---------+-----------+----------+-------+   +---------+---------------+---------+-----------+----------+-------+  LEFT      Compressibility Phasicity Spontaneity Properties Summary  +---------+---------------+---------+-----------+----------+-------+  CFV       Full            Yes       Yes                             +---------+---------------+---------+-----------+----------+-------+  SFJ       Full                                                      +---------+---------------+---------+-----------+----------+-------+  FV Prox   Full                                                      +---------+---------------+---------+-----------+----------+-------+  FV Mid    Full                                                      +---------+---------------+---------+-----------+----------+-------+  FV Distal Full                                                       +---------+---------------+---------+-----------+----------+-------+  PFV       Full                                                      +---------+---------------+---------+-----------+----------+-------+  POP       Full            Yes       Yes                             +---------+---------------+---------+-----------+----------+-------+  PTV       Full                                                      +---------+---------------+---------+-----------+----------+-------+  PERO  Full                                                      +---------+---------------+---------+-----------+----------+-------+  GSV       Full                                                      +---------+---------------+---------+-----------+----------+-------+  SSV       Full                                                      +---------+---------------+---------+-----------+----------+-------+     Summary: Right: No evidence of common femoral vein obstruction. Left: There is no evidence of deep vein thrombosis in the lower extremity.  *See table(s) above for measurements and observations. Electronically signed by Harold Barban MD on 03/23/2019 at 4:38:10 PM.    Final      ELIGIBLE FOR AVAILABLE RESEARCH PROTOCOL: no   ASSESSMENT: 78 y.o. [place] woman with multifactorial anemia, as follows:             (a) anemia of chronic inflammation: As of May 2020 she has a SED rate of 95, CRP 1.1, positive ANA and RF; she has been referred to rheumatology with an appointment pending 03/27/2019 inflammation makes iron stores relatively inaccessible for hematopoiesis and blunts the effect of native EPO             (b) hemolysis (mild): she has a mildly elevated LDH and a DAT positive for complement, not IgG; this is c/w (a) above             (c) anemia of renal insufficiency: inadequate EPO resonse to anemia            (d) occult  (d) GIB/ iron deficiency in patient on lifelong anticoagulaton  (1) Feraheme received  03/01/2019, to be repeated 03/20/2019   (a) reticulocyte 03/07/2019 up from 43.4 a year ago to 191.8 after iron infusion  (2) darbepoetin: Consider starting 03/20/2019 and repeat every 28 days; however given her excellent reticulocyte response to iron replacement without EPO, we will likely hold on darbepoetin for now    PLAN: Grady continues to have issues with her anemia.  I reviewed her case and recent hospitalization with Dr. Jana Hakim.  I reviewed her labs today with him.  His recommendation is to wait on iron levels and schedule IV iron on Thursday if needed.  She can be transfused if needed, however we need to be strict with transfusion parameters due to her cardiac history, previous volume overload, and electrolyte abnormalities.  I recommended that we transfuse for hemoglobin less than 7, or if she starts feeling worse/developing more symptoms.  Dr. Jana Hakim notes that this anemia is related to a rheumatologic condition, therefore, once the underlying cause is treated, the anemia should improve.  She is due to f/u with Dr. Trudie Reed on 7/28.  We will call and see if we can get this expedited.    In the meantime, Denaisha will return on Monday,  04/28/2019 for lab only.  She knows to call for any other issue that may develop before the next visit.  A total of (30) minutes of face-to-face time was spent with this patient with greater than 50% of that time in counseling and care-coordination.  Wilber Bihari, NP 04/21/19 11:22 AM Medical Oncology and Hematology [facility] [address] [place], [place] [address] Tel. [phone]    Fax. [phone]

## 2019-04-22 ENCOUNTER — Encounter (HOSPITAL_COMMUNITY): Payer: Medicare HMO

## 2019-04-22 ENCOUNTER — Ambulatory Visit (INDEPENDENT_AMBULATORY_CARE_PROVIDER_SITE_OTHER): Payer: Medicare HMO | Admitting: Pharmacist Clinician (PhC)/ Clinical Pharmacy Specialist

## 2019-04-22 ENCOUNTER — Telehealth: Payer: Self-pay | Admitting: Pharmacist

## 2019-04-22 ENCOUNTER — Other Ambulatory Visit: Payer: Self-pay | Admitting: *Deleted

## 2019-04-22 DIAGNOSIS — I4891 Unspecified atrial fibrillation: Secondary | ICD-10-CM

## 2019-04-22 DIAGNOSIS — Z7901 Long term (current) use of anticoagulants: Secondary | ICD-10-CM | POA: Diagnosis not present

## 2019-04-22 DIAGNOSIS — Z952 Presence of prosthetic heart valve: Secondary | ICD-10-CM

## 2019-04-22 LAB — POCT INR: INR: 2.2 (ref 2.0–3.0)

## 2019-04-22 NOTE — Patient Outreach (Signed)
[facility]) Care Management  04/22/2019  [name] 08/29/1941 [id number]   Call placed to member to follow up on current medical condition and recent GI bleed.  She report she has been feeling better over the past few days.  Denies any bleeding, report appetite has increased.  Confirms she received meals from [place], expresses gratitude stating "this has been very helpful."  She continue to have fatigue and weakness.  Was seen by oncologist yesterday, will have labs done on 7/20 to determine if she will need blood transfusion and/or iron infusion.  She report they are still unsure of what's causing her symptoms but feel it is caused by a rheumatologic condition.  Has appointment with rheumatology tomorrow, hoping to find an answer and some relief.  This care manager inquired about emergency alert/life alert system should she become ill when she is alone.  Does not have one but agrees it is a good idea.  She is unsure if her insurance will pay for it but will have her son contact customer service to ask about benefits.  Denies any urgent concerns at this time.  Will follow up within the next 2 weeks.   [facility] CM Care Plan Problem One     Most Recent Value  Care Plan Problem One  Risk for hospitalization related to anemia as evidenced by recent hospital admission  Role Documenting the Problem One  Care Management [place] for Problem One  Active  [place] Term Goal   Member will not be readmitted to hospital within the next 31 days  [facility] Long Term Goal Start Date  04/08/19 [Not met, date reset]  Interventions for Problem One Long Term Goal  Reviewed plan of care with member, including follow up labs and plan for potential infusion & transfusion  [facility] CM Short Term Goal #1   Member will keep and attend appointment with rheumatology specialist withinthe next week  [facility] CM Short Term Goal #1 Start Date  04/22/19  Interventions for Short Term Goal  #1  Reviewed upcoming appointment with member, confirmed member has transportation (son or daughter will provide)  [facility] CM Short Term Goal #2   Member will call to schedule follow up with primary MD within the next 2 weeks  [facility] CM Short Term Goal #2 Start Date  04/08/19  Interventions for Short Term Goal #2  Reminded to call primary MD office  to schedule follow up visit  [facility] CM Short Term Goal #3  Member will be able to verbalize plan for meal support within the next 2 weeks  [facility] CM Short Term Goal #3 Start Date  04/08/19  [facility] CM Short Term Goal #3 Met Date  04/22/19  [facility] CM Short Term Goal #4  Member will have plan for life alert/emergency alert system within the next 2 weeks  THN CM Short Term Goal #4 Start Date  04/22/19  Interventions for Short Term Goal #4  Discussed importance of emergency alert system in the event she is alone and unable to get to phone. Advised to have son call customer service number to inquire about benefits for device      [name], RN, MSN [place] [phone]

## 2019-04-22 NOTE — Patient Outreach (Signed)
[facility]) Care Management  04/22/2019  [name] 02-14-1941 [id number]   Patient was called regarding medication management with history of GI bleed on chronic anticoagulation.  Unfortunately, patient did not answer the phone. HIPAA compliant message was left on the voicemail of the number listed as her home phone.  The voicemail was not set up on the number listed as her mobile.  From chart review: Patient is an 66 year oldfemalewith a past medical history significantforPAD moderate to severe pulmonary HTN; PAF, Mechanical AVR on Coumadin; HTN; HLD; DM; and CAD s/p CABG,and erosive gastritis.  She was recently hospitalized for GI Bleed and leg cellulitis.   Plan: Send unsuccessful contact letter

## 2019-04-23 DIAGNOSIS — R768 Other specified abnormal immunological findings in serum: Secondary | ICD-10-CM | POA: Diagnosis not present

## 2019-04-23 DIAGNOSIS — M1009 Idiopathic gout, multiple sites: Secondary | ICD-10-CM | POA: Diagnosis not present

## 2019-04-23 DIAGNOSIS — M150 Primary generalized (osteo)arthritis: Secondary | ICD-10-CM | POA: Diagnosis not present

## 2019-04-23 DIAGNOSIS — R5383 Other fatigue: Secondary | ICD-10-CM | POA: Diagnosis not present

## 2019-04-23 DIAGNOSIS — J849 Interstitial pulmonary disease, unspecified: Secondary | ICD-10-CM | POA: Diagnosis not present

## 2019-04-23 DIAGNOSIS — I272 Pulmonary hypertension, unspecified: Secondary | ICD-10-CM | POA: Diagnosis not present

## 2019-04-23 DIAGNOSIS — M2550 Pain in unspecified joint: Secondary | ICD-10-CM | POA: Diagnosis not present

## 2019-04-25 ENCOUNTER — Inpatient Hospital Stay: Payer: Medicare HMO

## 2019-04-25 ENCOUNTER — Other Ambulatory Visit: Payer: Self-pay | Admitting: Pharmacist

## 2019-04-25 ENCOUNTER — Encounter: Payer: Self-pay | Admitting: Pharmacist

## 2019-04-25 ENCOUNTER — Other Ambulatory Visit: Payer: Self-pay

## 2019-04-25 ENCOUNTER — Other Ambulatory Visit: Payer: Self-pay | Admitting: Adult Health

## 2019-04-25 ENCOUNTER — Inpatient Hospital Stay (HOSPITAL_BASED_OUTPATIENT_CLINIC_OR_DEPARTMENT_OTHER): Payer: Medicare HMO | Admitting: Medical

## 2019-04-25 DIAGNOSIS — D594 Other nonautoimmune hemolytic anemias: Secondary | ICD-10-CM

## 2019-04-25 DIAGNOSIS — D696 Thrombocytopenia, unspecified: Secondary | ICD-10-CM | POA: Diagnosis not present

## 2019-04-25 DIAGNOSIS — Z794 Long term (current) use of insulin: Secondary | ICD-10-CM

## 2019-04-25 DIAGNOSIS — N189 Chronic kidney disease, unspecified: Secondary | ICD-10-CM

## 2019-04-25 DIAGNOSIS — E1122 Type 2 diabetes mellitus with diabetic chronic kidney disease: Secondary | ICD-10-CM | POA: Diagnosis not present

## 2019-04-25 DIAGNOSIS — D508 Other iron deficiency anemias: Secondary | ICD-10-CM

## 2019-04-25 DIAGNOSIS — D631 Anemia in chronic kidney disease: Secondary | ICD-10-CM

## 2019-04-25 DIAGNOSIS — N183 Chronic kidney disease, stage 3 unspecified: Secondary | ICD-10-CM

## 2019-04-25 DIAGNOSIS — N181 Chronic kidney disease, stage 1: Secondary | ICD-10-CM

## 2019-04-25 DIAGNOSIS — I4891 Unspecified atrial fibrillation: Secondary | ICD-10-CM | POA: Diagnosis not present

## 2019-04-25 LAB — CBC WITH DIFFERENTIAL (CANCER CENTER ONLY)
Abs Immature Granulocytes: 0.01 10*3/uL (ref 0.00–0.07)
Basophils Absolute: 0 10*3/uL (ref 0.0–0.1)
Basophils Relative: 1 %
Eosinophils Absolute: 0.1 10*3/uL (ref 0.0–0.5)
Eosinophils Relative: 3 %
HCT: 25.8 % — ABNORMAL LOW (ref 36.0–46.0)
Hemoglobin: 8.4 g/dL — ABNORMAL LOW (ref 12.0–15.0)
Immature Granulocytes: 0 %
Lymphocytes Relative: 17 %
Lymphs Abs: 0.5 10*3/uL — ABNORMAL LOW (ref 0.7–4.0)
MCH: 30.1 pg (ref 26.0–34.0)
MCHC: 32.6 g/dL (ref 30.0–36.0)
MCV: 92.5 fL (ref 80.0–100.0)
Monocytes Absolute: 0.1 10*3/uL (ref 0.1–1.0)
Monocytes Relative: 5 %
Neutro Abs: 1.9 10*3/uL (ref 1.7–7.7)
Neutrophils Relative %: 74 %
Platelet Count: 166 10*3/uL (ref 150–400)
RBC: 2.79 MIL/uL — ABNORMAL LOW (ref 3.87–5.11)
RDW: 14.8 % (ref 11.5–15.5)
WBC Count: 2.6 10*3/uL — ABNORMAL LOW (ref 4.0–10.5)
nRBC: 0 % (ref 0.0–0.2)

## 2019-04-25 LAB — CMP (CANCER CENTER ONLY)
ALT: 9 U/L (ref 0–44)
AST: 23 U/L (ref 15–41)
Albumin: 3 g/dL — ABNORMAL LOW (ref 3.5–5.0)
Alkaline Phosphatase: 81 U/L (ref 38–126)
Anion gap: 12 (ref 5–15)
BUN: 56 mg/dL — ABNORMAL HIGH (ref 8–23)
CO2: 17 mmol/L — ABNORMAL LOW (ref 22–32)
Calcium: 8.2 mg/dL — ABNORMAL LOW (ref 8.9–10.3)
Chloride: 103 mmol/L (ref 98–111)
Creatinine: 1.61 mg/dL — ABNORMAL HIGH (ref 0.44–1.00)
GFR, Est AFR Am: 35 mL/min — ABNORMAL LOW (ref >60)
GFR, Estimated: 30 mL/min — ABNORMAL LOW (ref >60)
Glucose, Bld: 143 mg/dL — ABNORMAL HIGH (ref 70–99)
Potassium: 3.9 mmol/L (ref 3.5–5.1)
Sodium: 132 mmol/L — ABNORMAL LOW (ref 135–145)
Total Bilirubin: 0.7 mg/dL (ref 0.3–1.2)
Total Protein: 7.2 g/dL (ref 6.5–8.1)

## 2019-04-25 LAB — SAMPLE TO BLOOD BANK

## 2019-04-25 NOTE — Progress Notes (Signed)
Patient seen in infusion by Sandi Mealy, PA. Labs reviewed. No further interventions needed today. IV removed and patient to lobby via wheelchair.

## 2019-04-25 NOTE — Progress Notes (Signed)
Upon assessment, patient c/o weakness, chills, fatigue, and just not feeling well. Wilber Bihari made aware and per Wilber Bihari, NP no [facility]era[facility] infusion today and have Sandi Mealy, PA come to infusion to assess patient. Labs drawn as ordered when IV started.

## 2019-04-25 NOTE — Patient Outreach (Signed)
[facility]) Care Management  [place]   04/25/2019  KARENNA ROMANOFF August 28, 1941 [id number]  Reason for referral: Medication Management  Referral source: [facility] RN Current insurance: [employer]  PMHx includes but not limited to:  CHF, Anemia, Anxiety, Aortic Atherosclerosis, CAD, CHF, CKD stage III, type 2 diabetes, GERD, sp aortic valve replacement, hypertension, Pulmonary hypertension, and  Peripheral arterial disease.  Outreach:  Successful telephone call with patient.  HIPAA identifiers verified.   Subjective:  Patient was recently hospitalized for GI bleed.  Patient reported she was feeling very fatigued this week. She has an appointment at the infusion center as she has been receiving aranesep injections    Objective: The ASCVD Risk score [name], et al., 2013) failed to calculate for the following reasons:   The 2013 ASCVD risk score is only valid for ages 6 to 5  Lab Results  Component Value Date   CREATININE 1.57 (H) 04/21/2019   CREATININE 1.57 (H) 04/07/2019   CREATININE 1.49 (H) 04/06/2019    Lab Results  Component Value Date   HGBA1C 4.6 (L) 02/24/2019    Lipid Panel     Component Value Date/Time   CHOL 89 02/06/2019 1119   TRIG 184 (H) 02/06/2019 1119   HDL 18 (L) 02/06/2019 1119   CHOLHDL 4.9 02/06/2019 1119   VLDL 37 02/06/2019 1119   LDLCALC 34 02/06/2019 1119    BP Readings from Last 3 Encounters:  04/21/19 (!) 168/38  04/07/19 (!) 159/50  03/22/19 (!) 170/56    Allergies  Allergen Reactions  . Flagyl [Metronidazole] Rash    ALL-OVER BODY RASH  . Coreg [Carvedilol] Other (See Comments)    Terrible cramping in the feet and had a lot of bowel movements, but not diarrhea  . Losartan Swelling    Patient doesn't recall site of swelling  . Verapamil Hives  . Zetia [Ezetimibe] Other (See Comments)    Reaction not recalled  . Zocor [Simvastatin - High Dose] Other (See Comments)    Reaction not recalled     Medications Reviewed Today    Reviewed by [name], [facility] (Pharmacist) on 04/25/19 at 1038  Med List Status: <None>  Medication Order Taking? Sig Documenting Provider Last Dose Status Informant  acetaminophen (TYLENOL) 325 MG tablet [id number] Yes Take 2 tablets (650 mg total) by mouth every 6 (six) hours as needed for mild pain, fever or headache (for pain or headaches).  Patient taking differently: Take 325 mg by mouth every 6 (six) hours as needed for fever or headache (pain).    [place], Floyce Stakes, PA-C Taking Active Self  allopurinol (ZYLOPRIM) 100 MG tablet [id number] Yes Take 100 mg by mouth daily. [provider] Taking Active   ALPRAZolam Duanne Moron) 0.5 MG tablet [id number] Yes Take 0.25 mg by mouth at bedtime.  [provider] Taking Active Self  atorvastatin (LIPITOR) 80 MG tablet [id number] Yes Take 1 tablet (80 mg total) by mouth daily at 6 PM. [name], MD Taking Active Self  Cholecalciferol (VITAMIN D3) 1000 UNITS CAPS [id number] Yes Take 1,000 Units by mouth daily with lunch.  [provider] Taking Active Self  Cyanocobalamin (VITAMIN B12) 1000 MCG TBCR [id number] Yes Take 1,000 mcg by mouth daily with lunch.  [provider] Taking Active Self  feeding supplement, GLUCERNA SHAKE, (GLUCERNA SHAKE) LIQD [id number] No Take 237 mLs by mouth 2 (two) times daily between meals.  Patient not taking: Reported on 04/25/2019   [name], MD Not  Taking Active   ferrous sulfate 325 (65 FE) MG tablet [id number] Yes Take 1 tablet (325 mg total) by mouth 2 (two) times daily with a meal. [name], MD Taking Active   furosemide (LASIX) 40 MG tablet [id number] Yes Take 80 mg by mouth daily. [provider] Taking Active Self  hydrALAZINE (APRESOLINE) 100 MG tablet [id number] Yes TAKE 1 TABLET (100 MG TOTAL) BY MOUTH 3 (THREE) TIMES DAILY. [name], MD Taking Active Self  magnesium oxide (MAG-OX) 400 (241.3 Mg) MG tablet [id number] Yes Take 1  tablet (400 mg total) by mouth daily. [name], MD Taking Active Self  metFORMIN (GLUCOPHAGE) 500 MG tablet [id number] Yes Take 500 mg by mouth 2 (two) times daily with a meal.  [provider] Taking Active Self  metoprolol tartrate (LOPRESSOR) 25 MG tablet [id number] Yes Take 0.5 tablets (12.5 mg total) by mouth 2 (two) times daily. [place] Donnamarie Poag, DO Taking Active Self  ondansetron (ZOFRAN ODT) 4 MG disintegrating tablet [id number] Yes Take 1 tablet (4 mg total) by mouth every 8 (eight) hours as needed for nausea or vomiting. [name], [name], PA-C Taking Active Self  pantoprazole (PROTONIX) 40 MG tablet [id number] Yes Take 40 mg by mouth 2 (two) times daily.  [provider] Taking Active Self           Med Note Kathyrn Drown Jun 20, 2017 10:33 AM)    Probiotic Product (PROBIOTIC PO) [id number] Yes Take 1 tablet by mouth daily. [provider] Taking Active Self  spironolactone (ALDACTONE) 25 MG tablet [id number] Yes TAKE ONE-HALF TABLET BY MOUTH DAILY  Patient taking differently: Take 25 mg by mouth daily.    [name], MD Taking Active Self  tadalafil (CIALIS) 20 MG tablet [id number] Yes Take 1 tablet (20 mg total) by mouth 2 (two) times daily. [name], MD Taking Active   vitamin C (ASCORBIC ACID) 500 MG tablet [id number] Yes Take 500 mg by mouth daily with lunch. [provider] Taking Active Self  warfarin (COUMADIN) 10 MG tablet [id number] Yes Take 10 mg today and then continue to take based on your prior schedule. [name], MD Taking Active           Assessment: ASSESSMENT: Date Discharged from Hospital:04/07/2019 Date Medication Reconciliation Performed: 04/25/2019  Medications:   New at Discharge cefuroxime (CEFTIN) enoxaparin (LOVENOX) feeding supplement (GLUCERNA SHAKE)  Adjustments at Discharge warfarin (COUMADIN)  Discontinue Taking cephalexin 500 MG capsule (KEFLEX) irbesartan 300 MG tablet  (AVAPRO)  Patient was recently discharged from hospital and all medications have been reviewed.      Drugs sorted by system:  Neurologic/Psychologic: Alprazolam  Cardiovascular:  Atorvastatin, Hydralazine, Metoprolol, Spironolactone, Tadalafil, Furosemide  Gastrointestinal: Pantoprazole, Ondansetron, Probiotic  Endocrine: Metformin  Renal: Allopurinol  Pain: Acetaminophen  Vitamins/Minerals/Supplements: Cholecalciferol, Vitamin B12, Ferrous Sulfate, Magnesium Oxide, Vitamin C  Medication Review Findings:  . Most Recent INR-2.2 (goal 2-3) . Allopurinol/Warfarin- allopurinol may inhibit the metabolism of warfarin and lead to increased bleeding. - Allopurinol was started yesterday by the patient's Rheumatologist, Dr. Gavin Pound. When asked did her Cardiologist know about Allopurinol, she said no. o Dr. Kennon Holter office was called to leave a message about the allopurinol.  Patient wondered if Tadalafil was causing her hemoglobin to be low and experience GI bleeds. She said the frequency of her hospitalizations increased after Tadalafil was started.  Tadalafil side effects were discussed with the patient:    Nausea 10%, (2% to 10%),  gastroenteritis (3% to 5%), gastroesophageal reflux disease (?3%), diarrhea (1% to 3%), abdominal pain (1% to 2%), dysphagia (<2%), esophagitis (<2%), gastritis (<2%), hemorrhoidal bleeding (<2%), loose stools (<2%), upper abdominal pain (<2%), vomiting (<2%), xerostomia (<2%)  Hematologic & oncologic: Rectal hemorrhage (<2%)   Patient with good understanding of regimen and good understanding of indications.    Medication Assistance Findings:  No medication assistance needs identified    Plan: . Will route note to PCP.  [name] Will follow-up in 3-5 business days.  [name], PharmD, [place] Clinical Pharmacist [phone]

## 2019-04-28 ENCOUNTER — Inpatient Hospital Stay: Payer: Medicare HMO

## 2019-04-28 ENCOUNTER — Telehealth: Payer: Self-pay | Admitting: Medical

## 2019-04-28 ENCOUNTER — Other Ambulatory Visit: Payer: Self-pay

## 2019-04-28 DIAGNOSIS — D594 Other nonautoimmune hemolytic anemias: Secondary | ICD-10-CM

## 2019-04-28 DIAGNOSIS — D508 Other iron deficiency anemias: Secondary | ICD-10-CM

## 2019-04-28 DIAGNOSIS — D696 Thrombocytopenia, unspecified: Secondary | ICD-10-CM | POA: Diagnosis not present

## 2019-04-28 DIAGNOSIS — D631 Anemia in chronic kidney disease: Secondary | ICD-10-CM | POA: Diagnosis not present

## 2019-04-28 DIAGNOSIS — I739 Peripheral vascular disease, unspecified: Secondary | ICD-10-CM

## 2019-04-28 DIAGNOSIS — N189 Chronic kidney disease, unspecified: Secondary | ICD-10-CM | POA: Diagnosis not present

## 2019-04-28 DIAGNOSIS — I4891 Unspecified atrial fibrillation: Secondary | ICD-10-CM | POA: Diagnosis not present

## 2019-04-28 DIAGNOSIS — E1122 Type 2 diabetes mellitus with diabetic chronic kidney disease: Secondary | ICD-10-CM | POA: Diagnosis not present

## 2019-04-28 LAB — CBC WITH DIFFERENTIAL/PLATELET
Abs Immature Granulocytes: 0.01 10*3/uL (ref 0.00–0.07)
Basophils Absolute: 0 10*3/uL (ref 0.0–0.1)
Basophils Relative: 1 %
Eosinophils Absolute: 0.1 10*3/uL (ref 0.0–0.5)
Eosinophils Relative: 3 %
HCT: 22.2 % — ABNORMAL LOW (ref 36.0–46.0)
Hemoglobin: 7.2 g/dL — ABNORMAL LOW (ref 12.0–15.0)
Immature Granulocytes: 0 %
Lymphocytes Relative: 15 %
Lymphs Abs: 0.5 10*3/uL — ABNORMAL LOW (ref 0.7–4.0)
MCH: 30.1 pg (ref 26.0–34.0)
MCHC: 32.4 g/dL (ref 30.0–36.0)
MCV: 92.9 fL (ref 80.0–100.0)
Monocytes Absolute: 0.2 10*3/uL (ref 0.1–1.0)
Monocytes Relative: 5 %
Neutro Abs: 2.4 10*3/uL (ref 1.7–7.7)
Neutrophils Relative %: 76 %
Platelets: 156 10*3/uL (ref 150–400)
RBC: 2.39 MIL/uL — ABNORMAL LOW (ref 3.87–5.11)
RDW: 14.5 % (ref 11.5–15.5)
WBC: 3.1 10*3/uL — ABNORMAL LOW (ref 4.0–10.5)
nRBC: 0 % (ref 0.0–0.2)

## 2019-04-28 LAB — CMP (CANCER CENTER ONLY)
ALT: 9 U/L (ref 0–44)
AST: 20 U/L (ref 15–41)
Albumin: 2.9 g/dL — ABNORMAL LOW (ref 3.5–5.0)
Alkaline Phosphatase: 75 U/L (ref 38–126)
Anion gap: 11 (ref 5–15)
BUN: 60 mg/dL — ABNORMAL HIGH (ref 8–23)
CO2: 18 mmol/L — ABNORMAL LOW (ref 22–32)
Calcium: 8.9 mg/dL (ref 8.9–10.3)
Chloride: 103 mmol/L (ref 98–111)
Creatinine: 1.75 mg/dL — ABNORMAL HIGH (ref 0.44–1.00)
GFR, Est AFR Am: 31 mL/min — ABNORMAL LOW (ref >60)
GFR, Estimated: 27 mL/min — ABNORMAL LOW (ref >60)
Glucose, Bld: 168 mg/dL — ABNORMAL HIGH (ref 70–99)
Potassium: 4.2 mmol/L (ref 3.5–5.1)
Sodium: 132 mmol/L — ABNORMAL LOW (ref 135–145)
Total Bilirubin: 0.6 mg/dL (ref 0.3–1.2)
Total Protein: 7.1 g/dL (ref 6.5–8.1)

## 2019-04-28 LAB — RETICULOCYTES
Immature Retic Fract: 7.4 % (ref 2.3–15.9)
RBC.: 2.35 MIL/uL — ABNORMAL LOW (ref 3.87–5.11)
Retic Count, Absolute: 54.5 10*3/uL (ref 19.0–186.0)
Retic Ct Pct: 2.3 % (ref 0.4–3.1)

## 2019-04-28 LAB — SAMPLE TO BLOOD BANK

## 2019-04-28 LAB — SAVE SMEAR(SSMR), FOR PROVIDER SLIDE REVIEW

## 2019-04-28 NOTE — Telephone Encounter (Signed)
No los per 7/17.

## 2019-04-29 ENCOUNTER — Telehealth: Payer: Self-pay

## 2019-04-29 ENCOUNTER — Encounter: Payer: Self-pay | Admitting: Pulmonary Disease

## 2019-04-29 ENCOUNTER — Ambulatory Visit: Payer: Medicare HMO | Admitting: Pulmonary Disease

## 2019-04-29 VITALS — BP 142/64 | HR 64 | Ht 62.0 in | Wt 125.0 lb

## 2019-04-29 DIAGNOSIS — I272 Pulmonary hypertension, unspecified: Secondary | ICD-10-CM

## 2019-04-29 DIAGNOSIS — I5032 Chronic diastolic (congestive) heart failure: Secondary | ICD-10-CM

## 2019-04-29 DIAGNOSIS — R600 Localized edema: Secondary | ICD-10-CM | POA: Diagnosis not present

## 2019-04-29 DIAGNOSIS — R942 Abnormal results of pulmonary function studies: Secondary | ICD-10-CM

## 2019-04-29 DIAGNOSIS — D594 Other nonautoimmune hemolytic anemias: Secondary | ICD-10-CM

## 2019-04-29 DIAGNOSIS — R918 Other nonspecific abnormal finding of lung field: Secondary | ICD-10-CM

## 2019-04-29 DIAGNOSIS — Z87891 Personal history of nicotine dependence: Secondary | ICD-10-CM

## 2019-04-29 LAB — FERRITIN: Ferritin: 156 ng/mL (ref 11–307)

## 2019-04-29 LAB — IRON AND TIBC
Iron: 22 ug/dL — ABNORMAL LOW (ref 41–142)
Saturation Ratios: 8 % — ABNORMAL LOW (ref 21–57)
TIBC: 260 ug/dL (ref 236–444)
UIBC: 238 ug/dL (ref 120–384)

## 2019-04-29 NOTE — Patient Instructions (Addendum)
Thank you for visiting Dr. Valeta Harms at [facility]. Today we recommend the following:  Make sure to keep your appointment with Dr. Aundra Dubin   Return in about 1 year (around 04/28/2020).    Please do your part to reduce the spread of COVID-19.

## 2019-04-29 NOTE — Telephone Encounter (Signed)

## 2019-04-29 NOTE — Progress Notes (Signed)
Synopsis: Referred in July 2020 for abnormal CT Chest by [name], MD  Subjective:   PATIENT ID: [name] GENDER: female DOB: [birth year], MRN: [id number]  Chief Complaint  Patient presents with   Consult    PMH of AoV replacement, pulmonary hypertension, heart failure. Recent admissions for GI bleeding. On coumadin for her valve. She takes 80mg  of lasix daily. She has hemolytic anemia and IDA. She is followed by heme here at [facility]. Former smoker, about 50 years, 1 ppd on average.  During her multiple hospitalization and evaluation she had a CT scan of the chest.  CT scan of the chest revealed bilateral pulmonary opacities.  Subsequent hospitalizations revealed chest x-rays with persistent bilateral perivascular groundglass opacities all consistent with pulmonary edema.  She did have a reaction she states at one point related to iron or transfusion that she had received in the past but this was several months ago.  Otherwise her breathing has been stable.  She does live alone.  She has follow-up appointment regarding management of her pulmonary hypertension with Dr. Aundra Dubin next week.   Past Medical History:  Diagnosis Date   Aortic atherosclerosis ([place]) 01/23/2018   Atrial fibrillation (HCC)    Carotid artery disease ([place])    Cholelithiasis 01/23/2018   Chronic anticoagulation    Coronary artery disease    status post coronary artery bypass grafting times 07/10/2004   Diabetes mellitus    Hypercholesteremia    Hypertension    Mechanical heart valve present    H. aortic valve replacement at the time of bypass surgery October 2005   Moderate to severe pulmonary hypertension ([place])    Peripheral arterial disease (HCC)    history of left common iliac artery PTA and stenting for a chronic total occlusion 08/26/01     Family History  Problem Relation Age of Onset   Breast cancer Neg Hx      Past Surgical History:  Procedure Laterality Date   AORTIC  VALVE REPLACEMENT     CARDIAC CATHETERIZATION  11/10/2004   40% right common illiac, 70% in stent restenosis of distal left common illiac,    CARDIAC CATHETERIZATION  05/18/2004   LAD 50-70% midstenosis, RCA dominant w/50% stenosis, 50% Right common Illiac artery ostial stenosis, 90% in stent restenosis within midportion of left common illiac stent   Carotid Duplex  03/12/2012   RSA-elev. velocities suggestive of a 50-69% diameter reduction, Right&Left Bulb/Prox ICA-mild-mod.fibrous plaqueelevating Velocities abnormal study.   CHOLECYSTECTOMY N/A 03/01/2018   Procedure: LAPAROSCOPIC CHOLECYSTECTOMY;  Surgeon: Judeth Horn, MD;  Location: [place];  Service: General;  Laterality: N/A;   COLONOSCOPY WITH PROPOFOL N/A 01/22/2018   Procedure: COLONOSCOPY WITH PROPOFOL;  Surgeon: Wilford Corner, MD;  Location: [place];  Service: Endoscopy;  Laterality: N/A;   ESOPHAGOGASTRODUODENOSCOPY (EGD) WITH PROPOFOL N/A 01/22/2018   Procedure: ESOPHAGOGASTRODUODENOSCOPY (EGD) WITH PROPOFOL;  Surgeon: Wilford Corner, MD;  Location: [place];  Service: Endoscopy;  Laterality: N/A;   ESOPHAGOGASTRODUODENOSCOPY (EGD) WITH PROPOFOL N/A 11/21/2018   Procedure: ESOPHAGOGASTRODUODENOSCOPY (EGD) WITH PROPOFOL;  Surgeon: [name], MD;  Location: [place];  Service: Gastroenterology;  Laterality: N/A;   ESOPHAGOGASTRODUODENOSCOPY (EGD) WITH PROPOFOL Left 02/27/2019   Procedure: ESOPHAGOGASTRODUODENOSCOPY (EGD) WITH PROPOFOL;  Surgeon: Arta Silence, MD;  Location: [facility] ENDOSCOPY;  Service: Endoscopy;  Laterality: Left;   GIVENS CAPSULE STUDY N/A 11/21/2018   Procedure: GIVENS CAPSULE STUDY;  Surgeon: [name], MD;  Location: [place];  Service: Gastroenterology;  Laterality: N/A;  To be deployed  during EGD   Lower Ext. Duplex  03/12/2012   Right Proximal CIA- vessel narrowing w/elevated velocities 0-49% diameter reduction. Right SFA-mild mixed density plaque throughout vessel.   NM MYOCAR  PERF WALL MOTION  05/19/2010   protocol: Persantine, post stress EF 65%, negative for ischemia, low risk scan   RIGHT HEART CATH N/A 06/27/2018   Procedure: RIGHT HEART CATH;  Surgeon: [name], MD;  Location: [place] [facility];  Service: Cardiovascular;  Laterality: N/A;   TRANSTHORACIC ECHOCARDIOGRAM  08/29/2012   Moderately calcified annulus of mitral valve, moderate regurg. of both mitral valve and tricuspid valve.     Social History   Socioeconomic History   Marital status: Widowed    Spouse name: Not on file   Number of children: Not on file   Years of education: Not on file   Highest education level: Not on file  Occupational History   Occupation: Retired  Scientist, product/process development strain: Not hard at [employer]    Worry: Never true    Inability: Never true   Transportation needs    Medical: No    Non-medical: No  Tobacco Use   Smoking status: Former Smoker    Packs/day: 1.00    Years: 30.00    Pack years: 30.00    Types: Cigarettes   Smokeless tobacco: Never Used   Tobacco comment: quit smoking 2005  Substance and Sexual Activity   Alcohol use: No    Alcohol/week: 0.0 standard drinks   Drug use: No   Sexual activity: Not Currently  Lifestyle   Physical activity    Days per week: 0 days    Minutes per session: 0 min   Stress: Only a little  Relationships   Social connections    Talks on phone: More than three times a week    Gets together: More than three times a week    Attends religious service: Never    Active member of club or organization: No    Attends meetings of clubs or organizations: Never    Relationship status: Widowed   Intimate partner violence    Fear of current or ex partner: No    Emotionally abused: No    Physically abused: No    Forced sexual activity: No  Other Topics Concern   Not on file  Social History Narrative   Not on file     Allergies  Allergen Reactions   Flagyl  [Metronidazole] Rash    ALL-OVER BODY RASH   Coreg [Carvedilol] Other (See Comments)    Terrible cramping in the feet and had a lot of bowel movements, but not diarrhea   Losartan Swelling    Patient doesn't recall site of swelling   Verapamil Hives   Zetia [Ezetimibe] Other (See Comments)    Reaction not recalled   Zocor [Simvastatin - High Dose] Other (See Comments)    Reaction not recalled     Outpatient Medications Prior to Visit  Medication Sig Dispense Refill   acetaminophen (TYLENOL) 325 MG tablet Take 2 tablets (650 mg total) by mouth every 6 (six) hours as needed for mild pain, fever or headache (for pain or headaches). (Patient taking differently: Take 325 mg by mouth every 6 (six) hours as needed for fever or headache (pain). )     allopurinol (ZYLOPRIM) 100 MG tablet Take 100 mg by mouth daily.     ALPRAZolam (XANAX) 0.5 MG tablet Take 0.25 mg by mouth  at bedtime.      atorvastatin (LIPITOR) 80 MG tablet Take 1 tablet (80 mg total) by mouth daily at 6 PM.     Cholecalciferol (VITAMIN D3) 1000 UNITS CAPS Take 1,000 Units by mouth daily with lunch.      Cyanocobalamin (VITAMIN B12) 1000 MCG TBCR Take 1,000 mcg by mouth daily with lunch.      feeding supplement, GLUCERNA SHAKE, (GLUCERNA SHAKE) LIQD Take 237 mLs by mouth 2 (two) times daily between meals. 237 mL 30   ferrous sulfate 325 (65 FE) MG tablet Take 1 tablet (325 mg total) by mouth 2 (two) times daily with a meal. 60 tablet 3   furosemide (LASIX) 40 MG tablet Take 80 mg by mouth daily.     hydrALAZINE (APRESOLINE) 100 MG tablet TAKE 1 TABLET (100 MG TOTAL) BY MOUTH 3 (THREE) TIMES DAILY. 270 tablet 1   magnesium oxide (MAG-OX) 400 (241.3 Mg) MG tablet Take 1 tablet (400 mg total) by mouth daily.     metFORMIN (GLUCOPHAGE) 500 MG tablet Take 500 mg by mouth 2 (two) times daily with a meal.      metoprolol tartrate (LOPRESSOR) 25 MG tablet Take 0.5 tablets (12.5 mg total) by mouth 2 (two) times daily. 60  tablet 0   ondansetron (ZOFRAN ODT) 4 MG disintegrating tablet Take 1 tablet (4 mg total) by mouth every 8 (eight) hours as needed for nausea or vomiting. 6 tablet 0   pantoprazole (PROTONIX) 40 MG tablet Take 40 mg by mouth 2 (two) times daily.      Probiotic Product (PROBIOTIC PO) Take 1 tablet by mouth daily.     spironolactone (ALDACTONE) 25 MG tablet TAKE ONE-HALF TABLET BY MOUTH DAILY (Patient taking differently: Take 25 mg by mouth daily. ) 45 tablet 4   tadalafil (CIALIS) 20 MG tablet Take 1 tablet (20 mg total) by mouth 2 (two) times daily. 180 tablet 0   vitamin C (ASCORBIC ACID) 500 MG tablet Take 500 mg by mouth daily with lunch.     warfarin (COUMADIN) 10 MG tablet Take 10 mg today and then continue to take based on your prior schedule. (Patient taking differently: Tuesday, Thursday, Saturday, Sunday 7.5mg  coumadin. Mon, Wed, Friday 5mg  04/29/2019.)     No facility-administered medications prior to visit.     Review of Systems  Constitutional: Negative for chills, fever, malaise/fatigue and weight loss.  HENT: Negative for hearing loss, sore throat and tinnitus.   Eyes: Negative for blurred vision and double vision.  Respiratory: Positive for shortness of breath. Negative for cough, hemoptysis, sputum production, wheezing and stridor.   Cardiovascular: Positive for leg swelling. Negative for chest pain, palpitations, orthopnea and PND.  Gastrointestinal: Negative for abdominal pain, constipation, diarrhea, heartburn, nausea and vomiting.  Genitourinary: Negative for dysuria, hematuria and urgency.  Musculoskeletal: Negative for joint pain and myalgias.  Skin: Negative for itching and rash.  Neurological: Negative for dizziness, tingling, weakness and headaches.  Endo/Heme/Allergies: Negative for environmental allergies. Does not bruise/bleed easily.  Psychiatric/Behavioral: Negative for depression. The patient is not nervous/anxious and does not have insomnia.   All other  systems reviewed and are negative.    Objective:  Physical Exam Vitals signs reviewed.  Constitutional:      General: She is not in acute distress.    Appearance: She is well-developed.  HENT:     Head: Normocephalic and atraumatic.  Eyes:     General: No scleral icterus.    Conjunctiva/sclera: Conjunctivae normal.  Pupils: Pupils are equal, round, and reactive to light.  Neck:     Musculoskeletal: Neck supple.     Vascular: No JVD.     Trachea: No tracheal deviation.  Cardiovascular:     Rate and Rhythm: Normal rate and regular rhythm.     Heart sounds: Normal heart sounds. No murmur.  Pulmonary:     Effort: Pulmonary effort is normal. No tachypnea, accessory muscle usage or respiratory distress.     Breath sounds: Normal breath sounds. No stridor. No wheezing, rhonchi or rales.  Abdominal:     General: Bowel sounds are normal. There is no distension.     Palpations: Abdomen is soft.     Tenderness: There is no abdominal tenderness.  Musculoskeletal:        General: No tenderness.     Right lower leg: Edema present.     Left lower leg: Edema present.  Lymphadenopathy:     Cervical: No cervical adenopathy.  Skin:    General: Skin is warm and dry.     Capillary Refill: Capillary refill takes less than 2 seconds.     Findings: No rash.  Neurological:     Mental Status: She is alert and oriented to person, place, and time.  Psychiatric:        Behavior: Behavior normal.      Vitals:   04/29/19 1335  BP: (!) 142/64  Pulse: 64  SpO2: 99%  Weight: 125 lb (56.7 kg)  Height: 5\' 2"  (1.575 m)   99% on RA BMI Readings from Last 3 Encounters:  04/29/19 22.86 kg/m  04/21/19 23.34 kg/m  04/07/19 23.69 kg/m   Wt Readings from Last 3 Encounters:  04/29/19 125 lb (56.7 kg)  04/21/19 123 lb 8 oz (56 kg)  04/07/19 125 lb 6.4 oz (56.9 kg)     CBC    Component Value Date/Time   WBC 3.1 (L) 04/28/2019 1415   RBC 2.35 (L) 04/28/2019 1415   RBC 2.39 (L)  04/28/2019 1415   HGB 7.2 (L) 04/28/2019 1415   HGB 8.4 (L) 04/25/2019 1300   HCT 22.2 (L) 04/28/2019 1415   HCT 28.5 (L) 11/20/2018 1824   PLT 156 04/28/2019 1415   PLT 166 04/25/2019 1300   MCV 92.9 04/28/2019 1415   MCH 30.1 04/28/2019 1415   MCHC 32.4 04/28/2019 1415   RDW 14.5 04/28/2019 1415   LYMPHSABS 0.5 (L) 04/28/2019 1415   MONOABS 0.2 04/28/2019 1415   EOSABS 0.1 04/28/2019 1415   BASOSABS 0.0 04/28/2019 1415    Chest Imaging: CT chest 12/12/2018: Bilateral perivascular groundglass opacities.  Enlarged cardiac silhouette.  Enlarged right ventricle enlarged pulmonary arteries.  Some areas of potential slight mosaicism. [name] images have been independently reviewed by me.    Pulmonary Functions Testing Results: PFT Results Latest Ref Rng & Units 07/11/2018  FVC-Pre L 1.87  FVC-Predicted Pre % 82  FVC-Post L 2.04  FVC-Predicted Post % 89  Pre FEV1/FVC % % 71  Post FEV1/FCV % % 71  FEV1-Pre L 1.34  FEV1-Predicted Pre % 79  FEV1-Post L 1.46  DLCO UNC% % 61  DLCO COR %Predicted % 83  TLC L 5.77  TLC % Predicted % 125  RV % Predicted % 169   FeNO: None   Pathology: None   Echocardiogram:  02/2018 Study Conclusions - Left ventricle: The cavity size was normal. Wall thickness was   normal. Systolic function was normal. The estimated ejection   fraction was in the  range of 55% to 60%. Wall motion was normal;   there were no regional wall motion abnormalities. Doppler   parameters are consistent with a reversible restrictive pattern,   indicative of decreased left ventricular diastolic compliance   and/or increased left atrial pressure (grade 3 diastolic   dysfunction). - Ventricular septum: The contour showed diastolic flattening. - Aortic valve: A mechanical prosthesis was present and functioning   normally. Peak velocity (S): 238 cm/s. Mean gradient (S): 10 mm   Hg. Valve area (VTI): 2.24 cm^2. Valve area (Vmax): 2.26 cm^2.   Valve area (Vmean): 1.88  cm^2. - Mitral valve: Calcified annulus. There was mild regurgitation.   Valve area by continuity equation (using LVOT flow): 2.53 cm^2. - Left atrium: The atrium was moderately to severely dilated. - Right ventricle: The cavity size was moderately dilated. Wall   thickness was normal. Systolic function was moderately to   severely reduced. - Right atrium: The atrium was severely dilated. - Tricuspid valve: There was severe regurgitation. - Pulmonary arteries: Systolic pressure was severely increased. PA   peak pressure: 88 mm Hg (S).  Heart Catheterization:  06/2018 1. Mildly elevated PCWP, normal RA pressure.  2. Preserved cardiac output.   3. Moderate pulmonary arterial hypertension.  PVR is only 3.1 WU due to wide PA pulse pressure.   Send V/Q scan to rule out chronic PEs, arrange for PFTs.  Will need to consider treatment of PAH given RV dysfunction by echo.     Assessment & Plan:     ICD-10-CM   1. Moderate to severe pulmonary hypertension (HCC)  I27.20   2. Chronic diastolic CHF (congestive heart failure) (HCC)  I50.32   3. 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 ([place])  D59.4   4. Lower extremity edema  R60.0   5. Abnormal CT scan of lung  R91.8   6. Decreased diffusion capacity  R94.2   7. Former smoker  Z87.891     Discussion:  This is an 78 year old female that was referred for evaluation of abnormal CT of the chest.  She has known moderate to severe pulmonary hypertension and chronic diastolic heart failure.  Longstanding history of hemolytic anemia requiring transfusions and IDA.  As for the abnormality seen on her CT imaging she has had subsequent chest x-rays for the past several months all revealing persistent groundglass opacities.  All of this is likely related to her underlying pulmonary hypertension and pulmonary edema.  She has not had any infectious symptoms during this time.  Groundglass opacities are rather ubiquitous and can represent  many different diseases on CT imaging.  However at this time she does not have any significant respiratory complaints.  She only has shortness of breath with significant exertion.  And this is at her baseline per the patient.  She is currently managed from a pulmonary hypertension and heart failure standpoint with diuretics and tadalafil.  I think she needs to keep her appointment with the heart failure clinic.  At this time we can hold off on an additional CT imaging.  If she has progressive respiratory complaints we can have further evaluation.  Today in the office we also reviewed her pulmonary function test.  She is a former smoker.  She does not have any evidence of obstruction.  She has a reduced DLCO likely related to her pulmonary hypertension  Greater than 50% of this patient's 30-minute office visit spent face-to-face discussing the above recommendations and treatment plan.    Current Outpatient Medications:  acetaminophen (TYLENOL) 325 MG tablet, Take 2 tablets (650 mg total) by mouth every 6 (six) hours as needed for mild pain, fever or headache (for pain or headaches). (Patient taking differently: Take 325 mg by mouth every 6 (six) hours as needed for fever or headache (pain). ), Disp: , Rfl:    allopurinol (ZYLOPRIM) 100 MG tablet, Take 100 mg by mouth daily., Disp: , Rfl:    ALPRAZolam (XANAX) 0.5 MG tablet, Take 0.25 mg by mouth at bedtime. , Disp: , Rfl:    atorvastatin (LIPITOR) 80 MG tablet, Take 1 tablet (80 mg total) by mouth daily at 6 PM., Disp: , Rfl:    Cholecalciferol (VITAMIN D3) 1000 UNITS CAPS, Take 1,000 Units by mouth daily with lunch. , Disp: , Rfl:    Cyanocobalamin (VITAMIN B12) 1000 MCG TBCR, Take 1,000 mcg by mouth daily with lunch. , Disp: , Rfl:    feeding supplement, GLUCERNA SHAKE, (GLUCERNA SHAKE) LIQD, Take 237 mLs by mouth 2 (two) times daily between meals., Disp: 237 mL, Rfl: 30   ferrous sulfate 325 (65 FE) MG tablet, Take 1 tablet (325 mg  total) by mouth 2 (two) times daily with a meal., Disp: 60 tablet, Rfl: 3   furosemide (LASIX) 40 MG tablet, Take 80 mg by mouth daily., Disp: , Rfl:    hydrALAZINE (APRESOLINE) 100 MG tablet, TAKE 1 TABLET (100 MG TOTAL) BY MOUTH 3 (THREE) TIMES DAILY., Disp: 270 tablet, Rfl: 1   magnesium oxide (MAG-OX) 400 (241.3 Mg) MG tablet, Take 1 tablet (400 mg total) by mouth daily., Disp: , Rfl:    metFORMIN (GLUCOPHAGE) 500 MG tablet, Take 500 mg by mouth 2 (two) times daily with a meal. , Disp: , Rfl:    metoprolol tartrate (LOPRESSOR) 25 MG tablet, Take 0.5 tablets (12.5 mg total) by mouth 2 (two) times daily., Disp: 60 tablet, Rfl: 0   ondansetron (ZOFRAN ODT) 4 MG disintegrating tablet, Take 1 tablet (4 mg total) by mouth every 8 (eight) hours as needed for nausea or vomiting., Disp: 6 tablet, Rfl: 0   pantoprazole (PROTONIX) 40 MG tablet, Take 40 mg by mouth 2 (two) times daily. , Disp: , Rfl:    Probiotic Product (PROBIOTIC PO), Take 1 tablet by mouth daily., Disp: , Rfl:    spironolactone (ALDACTONE) 25 MG tablet, TAKE ONE-HALF TABLET BY MOUTH DAILY (Patient taking differently: Take 25 mg by mouth daily. ), Disp: 45 tablet, Rfl: 4   tadalafil (CIALIS) 20 MG tablet, Take 1 tablet (20 mg total) by mouth 2 (two) times daily., Disp: 180 tablet, Rfl: 0   vitamin C (ASCORBIC ACID) 500 MG tablet, Take 500 mg by mouth daily with lunch., Disp: , Rfl:    warfarin (COUMADIN) 10 MG tablet, Take 10 mg today and then continue to take based on your prior schedule. (Patient taking differently: Tuesday, Thursday, Saturday, Sunday 7.5mg  coumadin. Mon, Wed, Friday 5mg  04/29/2019.), Disp: , Rfl:    Garner Nash, DO [facility] 04/29/2019 1:45 PM

## 2019-04-29 NOTE — Progress Notes (Signed)
I was asked to see [name] in the infusion room today as she presented for a possible transfusion and for consideration of IV iron.  It was determined that she did not need IV iron based on her ferritin level.  A CBC returned showing a hemoglobin of 8.4.  The patient did not require a transfusion of packed red blood cells.  I reviewed at length with her that her anemia was likely due to multiple issues.  These included anemia of chronic disease secondary to her diabetes, chronic kidney disease, and her history of gout and other inflammatory processes likely all contribute to her anemia.  We discussed that she should maintain good hydration and should watch her carbohydrate intake.  She should follow-up with her rheumatologist as scheduled.  I wrote all of this out so that the patient could share it with her family.  Sandi Mealy, MHS, PA-C Physician Assistant

## 2019-04-30 ENCOUNTER — Encounter: Payer: Self-pay | Admitting: Pharmacist

## 2019-04-30 ENCOUNTER — Other Ambulatory Visit: Payer: Self-pay | Admitting: Pharmacist

## 2019-04-30 NOTE — Patient Outreach (Addendum)
[facility]) Care Management  04/30/2019  [name] 01-Oct-1941 [id number]   Called patient to follow up. HIPAA identifiers were obtained.    Patient is an 78 year old female with multiple medical conditions including but not limited to:  CHF, Anemia, Anxiety, Aortic Atherosclerosis, CAD, CHF, CKD stage III, type 2 diabetes, GERD, sp aortic valve replacement, gout, hypertension, Pulmonary hypertension, and  Peripheral arterial disease.  Patient said she felt much better than she did last week in terms of her fatigue.  She did say that she was experiencing a gout attack.    She said she feels like her gout attack is over her entire body.    She has been having pain in her right hand, elbow, shoulders and legs.    Talked about a "gout diet" of low purine content foods.    Patient rated her pain 9/10.  She has been taking acetaminophen and tramadol.   Patient said she eats foods high in carbohydrate because that is what she has access to.  She did say she received 14 days worth of freezer meals and is due to receive some more meals today. She hopes to be able to cook for herself in two weeks.  Patient said she was going to call Dr. Gavin Pound to see if she can get a new prescription for Colchicine as that is what she took last time she had a gout flare.   Patient confirmed she is going [facility] tomorrow for an appointment at 11:00am.  I called them last week to let them know that Dr. Trudie Reed had added Allopurinol to the patient's medication regimen.  Patient said someone from the [facility] reached out to her after my call.    Patient was encouraged to let the [facility] know she is interested in restarted Colchicine as well.  INR from 04/22/2019 2.2 range (2-3)  Plan: Follow up with the patient in 2 weeks.  [name], PharmD, [facility] Clinical Pharmacist [phone]

## 2019-05-01 ENCOUNTER — Other Ambulatory Visit: Payer: Self-pay

## 2019-05-01 ENCOUNTER — Encounter: Payer: Self-pay | Admitting: Cardiology

## 2019-05-01 ENCOUNTER — Ambulatory Visit (INDEPENDENT_AMBULATORY_CARE_PROVIDER_SITE_OTHER): Payer: Medicare HMO | Admitting: Pharmacist

## 2019-05-01 ENCOUNTER — Ambulatory Visit (INDEPENDENT_AMBULATORY_CARE_PROVIDER_SITE_OTHER): Payer: Medicare HMO | Admitting: Medical

## 2019-05-01 VITALS — BP 108/60 | HR 60 | Ht 62.0 in | Wt 123.8 lb

## 2019-05-01 DIAGNOSIS — I5032 Chronic diastolic (congestive) heart failure: Secondary | ICD-10-CM

## 2019-05-01 DIAGNOSIS — I4891 Unspecified atrial fibrillation: Secondary | ICD-10-CM

## 2019-05-01 DIAGNOSIS — Z952 Presence of prosthetic heart valve: Secondary | ICD-10-CM

## 2019-05-01 DIAGNOSIS — I480 Paroxysmal atrial fibrillation: Secondary | ICD-10-CM

## 2019-05-01 DIAGNOSIS — I2510 Atherosclerotic heart disease of native coronary artery without angina pectoris: Secondary | ICD-10-CM

## 2019-05-01 DIAGNOSIS — M109 Gout, unspecified: Secondary | ICD-10-CM | POA: Diagnosis not present

## 2019-05-01 DIAGNOSIS — I739 Peripheral vascular disease, unspecified: Secondary | ICD-10-CM

## 2019-05-01 DIAGNOSIS — I6523 Occlusion and stenosis of bilateral carotid arteries: Secondary | ICD-10-CM | POA: Diagnosis not present

## 2019-05-01 DIAGNOSIS — Z7901 Long term (current) use of anticoagulants: Secondary | ICD-10-CM

## 2019-05-01 LAB — POCT INR: INR: 2.6 (ref 2.0–3.0)

## 2019-05-01 NOTE — Progress Notes (Signed)
Cardiology Office Note   Date:  05/02/2019   ID:  [name], DOB [birth year], MRN [id number]  PCP:  [name], MD  Cardiologist:  Loralie Champagne, MD; PAD followed by Quay Burow, MD EP: None  Chief Complaint  Patient presents with  . Follow-up    LE pain      History of Present Illness: [name] is a 78 y.o. female with PMH of CAD s/p CABG with mechanical AVR in 2005, pulmonary hypertension, chronic diastolic CHF with prominent RV dysfunction, PAD followed by Dr. [name], paroxysmal atrial fibrillation on coumadin, who presents for follow-up of LE pain.   She was last evaluated by cardiology via a telemedicine visit with Dr. Aundra Dubin [phone], at which time she had complaints of DOE and joint pain but no chest pain, palpitations, dizziness, lightheadedness, or syncope. She was referred to a rheumatologist for evaluation of joint pains. No medication changes occurred at this visit. She was last seen by Dr. [name] at an outpatient visit 12/2018 and recommended for repeat dopplers in 4-5 months and follow-up in a year.   She presents today for follow-up of LE pain. She underwent a LE doppler 03/2019 which was negative for DVT. Pain resolved. Unfortunately on Monday, 04/29/2019, she began experiencing pain in her feet, elbow, and shoulders. She was evaluated by her rheumatologist and started on allopurinol for gout and colchicine was subsequently added. She continues to experience joint pains. Prior to this gout flare she had no complaints of claudication. She tells me her LE edema is a little worse since her gout flare. Otherwise, she has been stable from a cardiac standpoint since her last visit with Dr. Aundra Dubin. Scheduled for an echo next week as well as follow-up with Dr. Aundra Dubin. No complaints of chest pain, SOB, changes in chronic DOE, dizziness, lightheadedness, palpitations, or syncope.     Past Medical History:  Diagnosis Date  . Aortic atherosclerosis ([place]) 01/23/2018  .  Atrial fibrillation ([place])   . Carotid artery disease ([place])   . Cholelithiasis 01/23/2018  . Chronic anticoagulation   . Coronary artery disease    status post coronary artery bypass grafting times 07/10/2004  . Diabetes mellitus   . Hypercholesteremia   . Hypertension   . Mechanical heart valve present    H. aortic valve replacement at the time of bypass surgery October 2005  . Moderate to severe pulmonary hypertension ([place])   . Peripheral arterial disease ([facility])    history of left common iliac artery PTA and stenting for a chronic total occlusion 08/26/01    Past Surgical History:  Procedure Laterality Date  . AORTIC VALVE REPLACEMENT    . CARDIAC CATHETERIZATION  11/10/2004   40% right common illiac, 70% in stent restenosis of distal left common illiac,   . CARDIAC CATHETERIZATION  05/18/2004   LAD 50-70% midstenosis, RCA dominant w/50% stenosis, 50% Right common Illiac artery ostial stenosis, 90% in stent restenosis within midportion of left common illiac stent  . Carotid Duplex  03/12/2012   RSA-elev. velocities suggestive of a 50-69% diameter reduction, Right&Left Bulb/Prox ICA-mild-mod.fibrous plaqueelevating Velocities abnormal study.  . CHOLECYSTECTOMY N/A 03/01/2018   Procedure: LAPAROSCOPIC CHOLECYSTECTOMY;  Surgeon: Judeth Horn, MD;  Location: [facility];  Service: General;  Laterality: N/A;  . COLONOSCOPY WITH PROPOFOL N/A 01/22/2018   Procedure: COLONOSCOPY WITH PROPOFOL;  Surgeon: Wilford Corner, MD;  Location: [facility];  Service: Endoscopy;  Laterality: N/A;  . ESOPHAGOGASTRODUODENOSCOPY (EGD) WITH PROPOFOL N/A 01/22/2018   Procedure:  ESOPHAGOGASTRODUODENOSCOPY (EGD) WITH PROPOFOL;  Surgeon: Wilford Corner, MD;  Location: [place];  Service: Endoscopy;  Laterality: N/A;  . ESOPHAGOGASTRODUODENOSCOPY (EGD) WITH PROPOFOL N/A 11/21/2018   Procedure: ESOPHAGOGASTRODUODENOSCOPY (EGD) WITH PROPOFOL;  Surgeon: [name], MD;  Location: [place];  Service:  Gastroenterology;  Laterality: N/A;  . ESOPHAGOGASTRODUODENOSCOPY (EGD) WITH PROPOFOL Left 02/27/2019   Procedure: ESOPHAGOGASTRODUODENOSCOPY (EGD) WITH PROPOFOL;  Surgeon: Arta Silence, MD;  Location: [facility] ENDOSCOPY;  Service: Endoscopy;  Laterality: Left;  . GIVENS CAPSULE STUDY N/A 11/21/2018   Procedure: GIVENS CAPSULE STUDY;  Surgeon: [name], MD;  Location: [place];  Service: Gastroenterology;  Laterality: N/A;  To be deployed during EGD  . Lower Ext. Duplex  03/12/2012   Right Proximal CIA- vessel narrowing w/elevated velocities 0-49% diameter reduction. Right SFA-mild mixed density plaque throughout vessel.  [name] NM MYOCAR PERF WALL MOTION  05/19/2010   protocol: Persantine, post stress EF 65%, negative for ischemia, low risk scan  . RIGHT HEART CATH N/A 06/27/2018   Procedure: RIGHT HEART CATH;  Surgeon: [name], MD;  Location: [facility];  Service: Cardiovascular;  Laterality: N/A;  . TRANSTHORACIC ECHOCARDIOGRAM  08/29/2012   Moderately calcified annulus of mitral valve, moderate regurg. of both mitral valve and tricuspid valve.      Current Outpatient Medications  Medication Sig Dispense Refill  . acetaminophen (TYLENOL) 325 MG tablet Take 325 mg by mouth every 6 (six) hours as needed.    [name] allopurinol (ZYLOPRIM) 100 MG tablet Take 100 mg by mouth daily.    [name] ALPRAZolam (XANAX) 0.5 MG tablet Take 0.25 mg by mouth at bedtime.     [name] atorvastatin (LIPITOR) 80 MG tablet Take 1 tablet (80 mg total) by mouth daily at 6 PM.    . Cholecalciferol (VITAMIN D3) 1000 UNITS CAPS Take 1,000 Units by mouth daily with lunch.     . Cyanocobalamin (VITAMIN B12) 1000 MCG TBCR Take 1,000 mcg by mouth daily with lunch.     . feeding supplement, GLUCERNA SHAKE, (GLUCERNA SHAKE) LIQD Take 237 mLs by mouth 2 (two) times daily between meals. 237 mL 30  . ferrous sulfate 325 (65 FE) MG tablet Take 1 tablet (325 mg total) by mouth 2 (two) times daily with a meal. 60 tablet 3  .  furosemide (LASIX) 40 MG tablet Take 80 mg by mouth daily.    . hydrALAZINE (APRESOLINE) 100 MG tablet TAKE 1 TABLET (100 MG TOTAL) BY MOUTH 3 (THREE) TIMES DAILY. 270 tablet 1  . magnesium oxide (MAG-OX) 400 (241.3 Mg) MG tablet Take 1 tablet (400 mg total) by mouth daily.    . metFORMIN (GLUCOPHAGE) 500 MG tablet Take 500 mg by mouth 2 (two) times daily with a meal.     . metoprolol tartrate (LOPRESSOR) 25 MG tablet Take 0.5 tablets (12.5 mg total) by mouth 2 (two) times daily. 60 tablet 0  . ondansetron (ZOFRAN ODT) 4 MG disintegrating tablet Take 1 tablet (4 mg total) by mouth every 8 (eight) hours as needed for nausea or vomiting. 6 tablet 0  . pantoprazole (PROTONIX) 40 MG tablet Take 40 mg by mouth 2 (two) times daily.     . Probiotic Product (PROBIOTIC PO) Take 1 tablet by mouth daily.    [name] spironolactone (ALDACTONE) 25 MG tablet Take 25 mg by mouth daily.    . tadalafil (CIALIS) 20 MG tablet Take 1 tablet (20 mg total) by mouth 2 (two) times daily. 180 tablet 0  . vitamin C (ASCORBIC  ACID) 500 MG tablet Take 500 mg by mouth daily with lunch.    . warfarin (COUMADIN) 5 MG tablet Take 1.5 tablets on Tues, Thursday, Saturday and Sunday. Take 1 tablet on Mondays, Wednesdays, and Fridays.     No current facility-administered medications for this visit.     Allergies:   Flagyl [metronidazole], Coreg [carvedilol], Losartan, Verapamil, Zetia [ezetimibe], and Zocor [simvastatin - high dose]    Social History:  The patient  reports that she has quit smoking. Her smoking use included cigarettes. She has a 30.00 pack-year smoking history. She has never used smokeless tobacco. She reports that she does not drink alcohol or use drugs.   Family History:  [name] family history is not on file.    ROS:  Please see the history of present illness.   Otherwise, review of systems are positive for none.   All other systems are reviewed and negative.    PHYSICAL EXAM: VS:  BP 108/60   Pulse 60    Ht 5\' 2"  (1.575 m)   Wt 123 lb 12.8 oz (56.2 kg)   SpO2 95%   BMI 22.64 kg/m  , BMI Body mass index is 22.64 kg/m. GEN: Well nourished, well developed, in no acute distress HEENT: sclera anicteric Neck: no JVD, carotid bruits, or masses Cardiac: IRIR; mechanical valve click, +murmur, rubs, or gallops, 1-2+ bilateral ankle edema  Respiratory:  clear to auscultation bilaterally, normal work of breathing GI: soft, nontender, nondistended, + BS MS: no deformity or atrophy Skin: warm and dry, no rash Neuro:  Strength and sensation are intact Psych: euthymic mood, full affect   EKG:  EKG is not ordered today.   Recent Labs: 07/23/2018: B Natriuretic Peptide 220.2 04/07/2019: Magnesium 1.6 04/28/2019: ALT 9; BUN 60; Creatinine 1.75; Hemoglobin 7.2; Platelets 156; Potassium 4.2; Sodium 132    Lipid Panel    Component Value Date/Time   CHOL 89 02/06/2019 1119   TRIG 184 (H) 02/06/2019 1119   HDL 18 (L) 02/06/2019 1119   CHOLHDL 4.9 02/06/2019 1119   VLDL 37 02/06/2019 1119   LDLCALC 34 02/06/2019 1119      Wt Readings from Last 3 Encounters:  05/01/19 123 lb 12.8 oz (56.2 kg)  04/29/19 125 lb (56.7 kg)  04/21/19 123 lb 8 oz (56 kg)      Other studies Reviewed: Additional studies/ records that were reviewed today include:   Echocardiogram 02/2018: Study Conclusions  - Left ventricle: The cavity size was normal. Wall thickness was   normal. Systolic function was normal. The estimated ejection   fraction was in the range of 55% to 60%. Wall motion was normal;   there were no regional wall motion abnormalities. Doppler   parameters are consistent with a reversible restrictive pattern,   indicative of decreased left ventricular diastolic compliance   and/or increased left atrial pressure (grade 3 diastolic   dysfunction). - Ventricular septum: The contour showed diastolic flattening. - Aortic valve: A mechanical prosthesis was present and functioning   normally. Peak  velocity (S): 238 cm/s. Mean gradient (S): 10 mm   Hg. Valve area (VTI): 2.24 cm^2. Valve area (Vmax): 2.26 cm^2.   Valve area (Vmean): 1.88 cm^2. - Mitral valve: Calcified annulus. There was mild regurgitation.   Valve area by continuity equation (using LVOT flow): 2.53 cm^2. - Left atrium: The atrium was moderately to severely dilated. - Right ventricle: The cavity size was moderately dilated. Wall   thickness was normal. Systolic function was moderately to  severely reduced. - Right atrium: The atrium was severely dilated. - Tricuspid valve: There was severe regurgitation. - Pulmonary arteries: Systolic pressure was severely increased. PA   peak pressure: 88 mm Hg (S).  Impressions:  - When compared to prior, PA pressures have increased.   ASSESSMENT AND PLAN:  1. PAD: no recent claudication. Last dopplers in 04/2018 were stable. Scheduled for repeat studies 05/2019.  - Continue statin - Not on ASA given need for coumadin  2. Chronic diastolic CHF: LE edema has increased with recent gout flare. Weight is stable. Lungs are clear on exam. Labs 04/28/2019 with Cr 1.75 (baseline ~1.5). Given bump in creatinine, hesitant to adjust lasix at this time - Encouraged LE elevation - hopeful edema will improve with resolution of gout flare  - Encouraged compression stockings.  - Continue lasix, spironolactone, and metoprolol   3. CAD s/p CABG: no anginal complaints. Not on aspirin given need for anticoagulation - Continue statin - Continue metoprolol  4. Paroxysmal atrial fibrillation: HR irregular on exam today with controlled rate. No complaints of palpitations or racing heart beats - Continue coumadin for stroke ppx given CHA2DS2-VASc Score of 7 (CHF, HTN, DM type 2, Vascular, Female, Age >59)   - Continue metoprolol for rate control  5. Mechanical AVR: valve replaced in 2005 at the time of CABG. INR therapeutic at 2.6 today (goal 2.5-3.5).  - Echo scheduled for next week for routine  monitoring - Continue coumadin  6. Gout: patient with recent gout flare affecting ankles, elbows, and shoulders. She was started on allopurinol last week with addition of colchicine after flare started  - Continue allopurinol and colchicine.   7. Carotid artery disease: patient with moderate bilateral carotid artery stenosis.  - Annual carotid duplex scheduled for 05/2019.    Current medicines are reviewed at length with the patient today.  The patient does not have concerns regarding medicines.  The following changes have been made:  no change  Labs/ tests ordered today include:  No orders of the defined types were placed in this encounter.    Disposition:   FU with Dr. [name] in 1 year  Signed, Abigail Butts, PA-C  05/02/2019 1:30 PM

## 2019-05-01 NOTE — Patient Instructions (Signed)
Medication Instructions:  Your physician recommends that you continue on your current medications as directed. Please refer to the Current Medication list given to you today.  If you need a refill on your cardiac medications before your next appointment, please call your pharmacy.   Lab work: None  If you have labs (blood work) drawn today and your tests are completely normal, you will receive your results only by: [name] MyChart Message (if you have MyChart) OR . A paper copy in the mail If you have any lab test that is abnormal or we need to change your treatment, we will call you to review the results.  Testing/Procedures: None   Follow-Up: At [facility], you and your health needs are our priority.  As part of our continuing mission to provide you with exceptional heart care, we have created designated Provider Care Teams.  These Care Teams include your primary Cardiologist (physician) and Advanced Practice Providers (APPs -  Physician Assistants and Nurse Practitioners) who all work together to provide you with the care you need, when you need it. You will need a follow up appointment in 12 months.  Please call our office 2 months in advance to schedule this appointment.  You may see Quay Burow, MD or one of the following Advanced Practice Providers on your designated Care Team:   Kerin Ransom, PA-C Roby Lofts, [place] . Sande Rives, PA-C  KEEP YOUR OTHER APPOINTMENTS AS SCHEDULED  Any Other Special Instructions Will Be Listed Below (If Applicable).

## 2019-05-02 ENCOUNTER — Encounter (HOSPITAL_COMMUNITY): Payer: Medicare HMO

## 2019-05-02 ENCOUNTER — Ambulatory Visit: Payer: Medicare HMO | Admitting: Podiatry

## 2019-05-02 ENCOUNTER — Encounter: Payer: Self-pay | Admitting: Medical

## 2019-05-05 ENCOUNTER — Other Ambulatory Visit: Payer: Self-pay

## 2019-05-05 ENCOUNTER — Encounter: Payer: Self-pay | Admitting: Adult Health

## 2019-05-05 ENCOUNTER — Inpatient Hospital Stay (HOSPITAL_BASED_OUTPATIENT_CLINIC_OR_DEPARTMENT_OTHER): Payer: Medicare HMO | Admitting: Adult Health

## 2019-05-05 ENCOUNTER — Inpatient Hospital Stay: Payer: Medicare HMO

## 2019-05-05 ENCOUNTER — Other Ambulatory Visit: Payer: Self-pay | Admitting: *Deleted

## 2019-05-05 VITALS — BP 167/33 | HR 57 | Temp 98.5°F | Resp 18

## 2019-05-05 VITALS — BP 147/41 | HR 60 | Temp 98.3°F | Resp 18 | Ht 62.0 in | Wt 123.5 lb

## 2019-05-05 DIAGNOSIS — D594 Other nonautoimmune hemolytic anemias: Secondary | ICD-10-CM

## 2019-05-05 DIAGNOSIS — D696 Thrombocytopenia, unspecified: Secondary | ICD-10-CM

## 2019-05-05 DIAGNOSIS — E1122 Type 2 diabetes mellitus with diabetic chronic kidney disease: Secondary | ICD-10-CM | POA: Diagnosis not present

## 2019-05-05 DIAGNOSIS — I4891 Unspecified atrial fibrillation: Secondary | ICD-10-CM

## 2019-05-05 DIAGNOSIS — I739 Peripheral vascular disease, unspecified: Secondary | ICD-10-CM

## 2019-05-05 DIAGNOSIS — N189 Chronic kidney disease, unspecified: Secondary | ICD-10-CM | POA: Diagnosis not present

## 2019-05-05 DIAGNOSIS — D508 Other iron deficiency anemias: Secondary | ICD-10-CM

## 2019-05-05 DIAGNOSIS — D500 Iron deficiency anemia secondary to blood loss (chronic): Secondary | ICD-10-CM

## 2019-05-05 DIAGNOSIS — N183 Chronic kidney disease, stage 3 unspecified: Secondary | ICD-10-CM

## 2019-05-05 DIAGNOSIS — D631 Anemia in chronic kidney disease: Secondary | ICD-10-CM

## 2019-05-05 LAB — CBC WITH DIFFERENTIAL/PLATELET
Abs Immature Granulocytes: 0.01 10*3/uL (ref 0.00–0.07)
Basophils Absolute: 0 10*3/uL (ref 0.0–0.1)
Basophils Relative: 0 %
Eosinophils Absolute: 0.2 10*3/uL (ref 0.0–0.5)
Eosinophils Relative: 6 %
HCT: 20.6 % — ABNORMAL LOW (ref 36.0–46.0)
Hemoglobin: 6.5 g/dL — CL (ref 12.0–15.0)
Immature Granulocytes: 0 %
Lymphocytes Relative: 14 %
Lymphs Abs: 0.4 10*3/uL — ABNORMAL LOW (ref 0.7–4.0)
MCH: 29.4 pg (ref 26.0–34.0)
MCHC: 31.6 g/dL (ref 30.0–36.0)
MCV: 93.2 fL (ref 80.0–100.0)
Monocytes Absolute: 0.1 10*3/uL (ref 0.1–1.0)
Monocytes Relative: 5 %
Neutro Abs: 2 10*3/uL (ref 1.7–7.7)
Neutrophils Relative %: 75 %
Platelets: 147 10*3/uL — ABNORMAL LOW (ref 150–400)
RBC: 2.21 MIL/uL — ABNORMAL LOW (ref 3.87–5.11)
RDW: 14.6 % (ref 11.5–15.5)
WBC: 2.7 10*3/uL — ABNORMAL LOW (ref 4.0–10.5)
nRBC: 0 % (ref 0.0–0.2)

## 2019-05-05 LAB — PREPARE RBC (CROSSMATCH)

## 2019-05-05 LAB — RETICULOCYTES
Immature Retic Fract: 16.2 % — ABNORMAL HIGH (ref 2.3–15.9)
RBC.: 2.21 MIL/uL — ABNORMAL LOW (ref 3.87–5.11)
Retic Count, Absolute: 63.4 10*3/uL (ref 19.0–186.0)
Retic Ct Pct: 2.9 % (ref 0.4–3.1)

## 2019-05-05 LAB — IRON AND TIBC
Iron: 37 ug/dL — ABNORMAL LOW (ref 41–142)
Saturation Ratios: 15 % — ABNORMAL LOW (ref 21–57)
TIBC: 251 ug/dL (ref 236–444)
UIBC: 215 ug/dL (ref 120–384)

## 2019-05-05 LAB — ABO/RH: ABO/RH(D): A POS

## 2019-05-05 LAB — CMP (CANCER CENTER ONLY)
ALT: 10 U/L (ref 0–44)
AST: 20 U/L (ref 15–41)
Albumin: 2.8 g/dL — ABNORMAL LOW (ref 3.5–5.0)
Alkaline Phosphatase: 83 U/L (ref 38–126)
Anion gap: 8 (ref 5–15)
BUN: 57 mg/dL — ABNORMAL HIGH (ref 8–23)
CO2: 19 mmol/L — ABNORMAL LOW (ref 22–32)
Calcium: 8.4 mg/dL — ABNORMAL LOW (ref 8.9–10.3)
Chloride: 104 mmol/L (ref 98–111)
Creatinine: 1.99 mg/dL — ABNORMAL HIGH (ref 0.44–1.00)
GFR, Est AFR Am: 27 mL/min — ABNORMAL LOW (ref >60)
GFR, Estimated: 23 mL/min — ABNORMAL LOW (ref >60)
Glucose, Bld: 194 mg/dL — ABNORMAL HIGH (ref 70–99)
Potassium: 4.2 mmol/L (ref 3.5–5.1)
Sodium: 131 mmol/L — ABNORMAL LOW (ref 135–145)
Total Bilirubin: 0.5 mg/dL (ref 0.3–1.2)
Total Protein: 6.8 g/dL (ref 6.5–8.1)

## 2019-05-05 LAB — SAVE SMEAR(SSMR), FOR PROVIDER SLIDE REVIEW

## 2019-05-05 LAB — FERRITIN: Ferritin: 118 ng/mL (ref 11–307)

## 2019-05-05 MED ORDER — DIPHENHYDRAMINE HCL 25 MG PO CAPS
ORAL_CAPSULE | ORAL | Status: AC
  Filled 2019-05-05: qty 1

## 2019-05-05 MED ORDER — ACETAMINOPHEN 325 MG PO TABS
650.0000 mg | ORAL_TABLET | Freq: Once | ORAL | Status: AC
  Administered 2019-05-05: 14:00:00 650 mg via ORAL

## 2019-05-05 MED ORDER — DARBEPOETIN ALFA 200 MCG/0.4ML IJ SOSY
PREFILLED_SYRINGE | INTRAMUSCULAR | Status: AC
  Filled 2019-05-05: qty 0.4

## 2019-05-05 MED ORDER — DIPHENHYDRAMINE HCL 25 MG PO CAPS
25.0000 mg | ORAL_CAPSULE | Freq: Once | ORAL | Status: AC
  Administered 2019-05-05: 25 mg via ORAL

## 2019-05-05 MED ORDER — ACETAMINOPHEN 325 MG PO TABS
ORAL_TABLET | ORAL | Status: AC
  Filled 2019-05-05: qty 2

## 2019-05-05 MED ORDER — SODIUM CHLORIDE 0.9% FLUSH
10.0000 mL | INTRAVENOUS | Status: DC | PRN
  Filled 2019-05-05: qty 10

## 2019-05-05 MED ORDER — SODIUM CHLORIDE 0.9% IV SOLUTION
250.0000 mL | Freq: Once | INTRAVENOUS | Status: AC
  Administered 2019-05-05: 14:00:00 250 mL via INTRAVENOUS
  Filled 2019-05-05: qty 250

## 2019-05-05 MED ORDER — DARBEPOETIN ALFA 200 MCG/0.4ML IJ SOSY
150.0000 ug | PREFILLED_SYRINGE | Freq: Once | INTRAMUSCULAR | Status: AC
  Administered 2019-05-05: 150 ug via SUBCUTANEOUS

## 2019-05-05 MED ORDER — HEPARIN SOD (PORK) LOCK FLUSH 100 UNIT/ML IV SOLN
500.0000 [IU] | Freq: Every day | INTRAVENOUS | Status: DC | PRN
  Filled 2019-05-05: qty 5

## 2019-05-05 NOTE — Progress Notes (Signed)
[facility]  [phone] [phone]    ID: CHRISTINE MORTON DOB: August 10, 1941  MR#: [id number]  OVZ#:[id number]  Patient Care Team: [name], MD as PCP - General (Internal Medicine) [name], MD as PCP - Advanced Heart Failure (Cardiology) Lorretta Harp, MD as PCP - Cardiology (Cardiology) [name], RN as [place] Management Magrinat, [name], MD as Consulting Physician (Hematology and Oncology) Arta Silence, MD as Consulting Physician (Gastroenterology) [name], MD as Consulting Physician (Cardiology) Marzetta Board, DPM as Consulting Physician (Podiatry) Gavin Pound, MD as Consulting Physician (Rheumatology) [name], [facility] as [place] [facility] (Pharmacist) OTHER MD:   CHIEF COMPLAINT: anemia and thrombocytopenia  CURRENT TREATMENT: Feraheme, Aranesp  INTERVAL HISTORY: Gilma was seen today for follow-up and treatment of her anemia and thrombocytopenia associated with chronic inflammatory disease.   She recently received Feraheme for her iron deficiency anemia on 03/01/2019 and 03/20/2019.  Since her last visit she was evaluated by rheumatology and was treated with allopurinol and colchicine for a gout flare.    She was also evaluated by cardiology, and was scheduled for an echo, carotid duplex, as routine monitoring.  She was recommended to elevate her lower extremities and use compression stockings for her edema.  She was continued on Lasix, spironolactone and metoprolol.    She also saw pulmonology who evaluated her persistent lung ground glass opacities, which were attributed to her pulmonary hypertension and pulmonary edema.  Since her symptoms were stable, she was recommended continued monitoring, and repeating chest CT only for progressive pulmonary issues.    REVIEW OF SYSTEMS: Claris notes that she is feeling moderately well today.  She says that  her gout flare is improved.  She is taking allopurinol and colchicine.  She had some lower extremity swelling associated with  the gout flare that she feels is getting better also.  Shulamit says she has some shortness of breath with exertion, but denies dizziness, headache, cough, or any other concerns today.  She overall feels better today than she has been.  A detailed ROS was otherwise non contributory.    HISTORY OF CURRENT ILLNESS: From the original consult note:  RANELL FINELLI is an 78 y.o. female from [place], [place].  She has a past medical history significant forPAD s/p stenting; moderate to severe pulmonary HTN; afib and AVR on Coumadin; HTN; HLD; DM; and CAD s/p CABG. the patient was admitted to the hospital for anemia.  She was having increasing fatigue and shortness of breath and had routine lab work drawn which showed a hemoglobin of 6.6.  Due to her abnormal lab work, she was advised to come to the emergency room for admission.  She was also admitted in February 2020 for symptomatic anemia.  She reports she has been anemic for at least a year and has been taking oral iron.  She underwent a colonoscopy and EGD on 01/22/2018.  The colonoscopy showed diverticulosis in the sigmoid colon and descending colon, internal hemorrhoids.  The upper endoscopy was normal except for erythematous mucosa in the antrum.  She had a repeat EGD on 11/21/2018 that showed a few minimally oozing( with scope trauma) superficial gastric ulcers with pigmented material were found in the gastric body. The largest lesion was 4 mm in largest dimension.  She underwent another upper endoscopy on 02/27/2019 which showed patchy mildly friable mucosa with contact bleeding was found in the gastric fundus, in the gastric  body and in the gastric antrum.  The patient is maintained on warfarin due to A. fib and a mechanical aortic valve.  Review of the patient's chart shows that she has been anemic dating back to at least  December thousand 12 (these are the earliest labs available to me).  Her highest hemoglobin was 11.6 in December 2012.  I also note that the patient has had intermittent mild leukopenia adding back to at least April 2019.  Her lowest white blood cell count was 2.6 in April 2019.  Since admission, the patient has received 1 unit of packed red blood cells.  She is currently on heparin for her procedure with plan to bridge back to Coumadin.  Stool for occult blood was negative x1 on 02/24/2019.  Hematology was asked see the patient to make recommendations regarding her anemia.  [name] subsequent history is as detailed below.   PAST MEDICAL HISTORY: Past Medical History:  Diagnosis Date   Aortic atherosclerosis ([place]) 01/23/2018   Atrial fibrillation (HCC)    Carotid artery disease ([place])    Cholelithiasis 01/23/2018   Chronic anticoagulation    Coronary artery disease    status post coronary artery bypass grafting times 07/10/2004   Diabetes mellitus    Hypercholesteremia    Hypertension    Mechanical heart valve present    H. aortic valve replacement at the time of bypass surgery October 2005   Moderate to severe pulmonary hypertension ([place])    Peripheral arterial disease ([place])    history of left common iliac artery PTA and stenting for a chronic total occlusion 08/26/01     PAST SURGICAL HISTORY: Past Surgical History:  Procedure Laterality Date   AORTIC VALVE REPLACEMENT     CARDIAC CATHETERIZATION  11/10/2004   40% right common illiac, 70% in stent restenosis of distal left common illiac,    CARDIAC CATHETERIZATION  05/18/2004   LAD 50-70% midstenosis, RCA dominant w/50% stenosis, 50% Right common Illiac artery ostial stenosis, 90% in stent restenosis within midportion of left common illiac stent   Carotid Duplex  03/12/2012   RSA-elev. velocities suggestive of a 50-69% diameter reduction, Right&Left Bulb/Prox ICA-mild-mod.fibrous plaqueelevating Velocities  abnormal study.   CHOLECYSTECTOMY N/A 03/01/2018   Procedure: LAPAROSCOPIC CHOLECYSTECTOMY;  Surgeon: Judeth Horn, MD;  Location: [facility];  Service: General;  Laterality: N/A;   COLONOSCOPY WITH PROPOFOL N/A 01/22/2018   Procedure: COLONOSCOPY WITH PROPOFOL;  Surgeon: Wilford Corner, MD;  Location: [facility];  Service: Endoscopy;  Laterality: N/A;   ESOPHAGOGASTRODUODENOSCOPY (EGD) WITH PROPOFOL N/A 01/22/2018   Procedure: ESOPHAGOGASTRODUODENOSCOPY (EGD) WITH PROPOFOL;  Surgeon: Wilford Corner, MD;  Location: [facility];  Service: Endoscopy;  Laterality: N/A;   ESOPHAGOGASTRODUODENOSCOPY (EGD) WITH PROPOFOL N/A 11/21/2018   Procedure: ESOPHAGOGASTRODUODENOSCOPY (EGD) WITH PROPOFOL;  Surgeon: [name], MD;  Location: [facility];  Service: Gastroenterology;  Laterality: N/A;   ESOPHAGOGASTRODUODENOSCOPY (EGD) WITH PROPOFOL Left 02/27/2019   Procedure: ESOPHAGOGASTRODUODENOSCOPY (EGD) WITH PROPOFOL;  Surgeon: Arta Silence, MD;  Location: [facility] ENDOSCOPY;  Service: Endoscopy;  Laterality: Left;   GIVENS CAPSULE STUDY N/A 11/21/2018   Procedure: GIVENS CAPSULE STUDY;  Surgeon: [name], MD;  Location: [facility];  Service: Gastroenterology;  Laterality: N/A;  To be deployed during EGD   Lower Ext. Duplex  03/12/2012   Right Proximal CIA- vessel narrowing w/elevated velocities 0-49% diameter reduction. Right SFA-mild mixed density plaque throughout vessel.   NM MYOCAR PERF WALL MOTION  05/19/2010   protocol: Persantine, post stress EF 65%, negative for ischemia, low risk  scan   RIGHT HEART CATH N/A 06/27/2018   Procedure: RIGHT HEART CATH;  Surgeon: [name], MD;  Location: [facility];  Service: Cardiovascular;  Laterality: N/A;   TRANSTHORACIC ECHOCARDIOGRAM  08/29/2012   Moderately calcified annulus of mitral valve, moderate regurg. of both mitral valve and tricuspid valve.      FAMILY HISTORY: Family History  Problem Relation Age of Onset   Breast  cancer Neg Hx     GYNECOLOGIC HISTORY:  No LMP recorded. Patient has had a hysterectomy. Menarche:  years old Age at first live birth:  [place] P: 3 LMP:  Contraceptive:  HRT:   Hysterectomy?: yes BSO?:    SOCIAL HISTORY: (Current as of 03/07/2019) Kylin is widowed.  She lives alone here in [place], [place].  She has 3 children that live locally.  She smoked three quarters of a pack of cigarettes daily for 50 years.  She quit in 2005.  Denies alcohol use.   ADVANCED DIRECTIVES: Not in place    HEALTH MAINTENANCE: Social History   Tobacco Use   Smoking status: Former Smoker    Packs/day: 1.00    Years: 30.00    Pack years: 30.00    Types: Cigarettes   Smokeless tobacco: Never Used   Tobacco comment: quit smoking 2005  Substance Use Topics   Alcohol use: No    Alcohol/week: 0.0 standard drinks   Drug use: No    Colonoscopy: EGD on 02/27/2019 under Dr. Paulita Fujita found some friable gastric mucosa.  Colonoscopy under Dr. Michail Sermon 01/22/2018 showed significant diverticular disease  PAP:   Bone density:  Mammography: 02/25/2017, breast density category B.  Allergies  Allergen Reactions   Flagyl [Metronidazole] Rash    ALL-OVER BODY RASH   Coreg [Carvedilol] Other (See Comments)    Terrible cramping in the feet and had a lot of bowel movements, but not diarrhea   Losartan Swelling    Patient doesn't recall site of swelling   Verapamil Hives   Zetia [Ezetimibe] Other (See Comments)    Reaction not recalled   Zocor [Simvastatin - High Dose] Other (See Comments)    Reaction not recalled    Current Outpatient Medications  Medication Sig Dispense Refill   acetaminophen (TYLENOL) 325 MG tablet Take 325 mg by mouth every 6 (six) hours as needed.     allopurinol (ZYLOPRIM) 100 MG tablet Take 100 mg by mouth daily.     ALPRAZolam (XANAX) 0.5 MG tablet Take 0.25 mg by mouth at bedtime.      atorvastatin (LIPITOR) 80 MG tablet Take 1 tablet (80 mg total)  by mouth daily at 6 PM.     Cholecalciferol (VITAMIN D3) 1000 UNITS CAPS Take 1,000 Units by mouth daily with lunch.      Cyanocobalamin (VITAMIN B12) 1000 MCG TBCR Take 1,000 mcg by mouth daily with lunch.      feeding supplement, GLUCERNA SHAKE, (GLUCERNA SHAKE) LIQD Take 237 mLs by mouth 2 (two) times daily between meals. 237 mL 30   ferrous sulfate 325 (65 FE) MG tablet Take 1 tablet (325 mg total) by mouth 2 (two) times daily with a meal. 60 tablet 3   furosemide (LASIX) 40 MG tablet Take 80 mg by mouth daily.     hydrALAZINE (APRESOLINE) 100 MG tablet TAKE 1 TABLET (100 MG TOTAL) BY MOUTH 3 (THREE) TIMES DAILY. 270 tablet 1   magnesium oxide (MAG-OX) 400 (241.3 Mg) MG tablet Take 1 tablet (400 mg total) by mouth daily.  metFORMIN (GLUCOPHAGE) 500 MG tablet Take 500 mg by mouth 2 (two) times daily with a meal.      metoprolol tartrate (LOPRESSOR) 25 MG tablet Take 0.5 tablets (12.5 mg total) by mouth 2 (two) times daily. 60 tablet 0   ondansetron (ZOFRAN ODT) 4 MG disintegrating tablet Take 1 tablet (4 mg total) by mouth every 8 (eight) hours as needed for nausea or vomiting. 6 tablet 0   pantoprazole (PROTONIX) 40 MG tablet Take 40 mg by mouth 2 (two) times daily.      Probiotic Product (PROBIOTIC PO) Take 1 tablet by mouth daily.     spironolactone (ALDACTONE) 25 MG tablet Take 25 mg by mouth daily.     tadalafil (CIALIS) 20 MG tablet Take 1 tablet (20 mg total) by mouth 2 (two) times daily. 180 tablet 0   vitamin C (ASCORBIC ACID) 500 MG tablet Take 500 mg by mouth daily with lunch.     warfarin (COUMADIN) 5 MG tablet Take 1.5 tablets on Tues, Thursday, Saturday and Sunday. Take 1 tablet on Mondays, Wednesdays, and Fridays.     No current facility-administered medications for this visit.      OBJECTIVE:   Vitals:   05/05/19 1137  BP: (!) 147/41  Pulse: 60  Resp: 18  Temp: 98.3 F (36.8 C)  SpO2: 98%     Body mass index is 22.59 kg/m.   Wt Readings from  Last 3 Encounters:  05/05/19 123 lb 8 oz (56 kg)  05/01/19 123 lb 12.8 oz (56.2 kg)  04/29/19 125 lb (56.7 kg)      ECOG FS:1 - Symptomatic but completely ambulatory GENERAL: Patient is a chronically ill appearing older woman in no acute distress, examined in wheelchair HEENT:  Sclerae anicteric.  Oropharynx clear and moist. No ulcerations or evidence of oropharyngeal candidiasis. Neck is supple.  NODES:  No cervical, supraclavicular, or axillary lymphadenopathy palpated.  LUNGS:  Clear to auscultation bilaterally.  No wheezes or rhonchi. HEART:  Regular rate and rhythm. No murmur appreciated. ABDOMEN:  Soft, nontender.  Positive, normoactive bowel sounds. No organomegaly palpated. MSK:  No focal spinal tenderness to palpation. Full range of motion bilaterally in the upper extremities. EXTREMITIES: 1+ bilateral lower extremity edema SKIN:  Clear with no obvious rashes or skin changes. Pale. NEURO:  Nonfocal. Well oriented.  Appropriate affect.      LAB RESULTS:  CMP     Component Value Date/Time   NA 131 (L) 05/05/2019 1109   NA 137 12/06/2017 1436   K 4.2 05/05/2019 1109   CL 104 05/05/2019 1109   CO2 19 (L) 05/05/2019 1109   GLUCOSE 194 (H) 05/05/2019 1109   BUN 57 (H) 05/05/2019 1109   BUN 20 12/06/2017 1436   CREATININE 1.99 (H) 05/05/2019 1109   CALCIUM 8.4 (L) 05/05/2019 1109   PROT 6.8 05/05/2019 1109   ALBUMIN 2.8 (L) 05/05/2019 1109   AST 20 05/05/2019 1109   ALT 10 05/05/2019 1109   ALKPHOS 83 05/05/2019 1109   BILITOT 0.5 05/05/2019 1109   GFRNONAA 23 (L) 05/05/2019 1109   GFRAA 27 (L) 05/05/2019 1109    No results found for: TOTALPROTELP, ALBUMINELP, A1GS, A2GS, BETS, BETA2SER, GAMS, MSPIKE, SPEI  No results found for: KPAFRELGTCHN, LAMBDASER, KAPLAMBRATIO  Lab Results  Component Value Date   WBC 2.7 (L) 05/05/2019   NEUTROABS 2.0 05/05/2019   HGB 6.5 (LL) 05/05/2019   HCT 20.6 (L) 05/05/2019   MCV 93.2 05/05/2019   PLT 147 (L) 05/05/2019     @  LASTCHEMISTRY@  No results found for: LABCA2  No components found for: [id number]  Recent Labs  Lab 05/01/19 1127  INR 2.6    No results found for: LABCA2  No results found for: VZC588  No results found for: FOY774  No results found for: JOI786  No results found for: CA2729  No components found for: HGQUANT  No results found for: CEA1 / No results found for: CEA1   No results found for: AFPTUMOR  No results found for: [place]  No results found for: PSA1  Appointment on 05/05/2019  Component Date Value Ref Range Status   WBC 05/05/2019 2.7* 4.0 - 10.5 K/uL Final   RBC 05/05/2019 2.21* 3.87 - 5.11 MIL/uL Final   Hemoglobin 05/05/2019 6.5* 12.0 - 15.0 g/dL Final   This critical result has verified and been called to VAL DODD, RN by Modena Slater on 07 27 2020 at 1128, and has been read back.    HCT 05/05/2019 20.6* 36.0 - 46.0 % Final   MCV 05/05/2019 93.2  80.0 - 100.0 fL Final   MCH 05/05/2019 29.4  26.0 - 34.0 pg Final   MCHC 05/05/2019 31.6  30.0 - 36.0 g/dL Final   RDW 05/05/2019 14.6  11.5 - 15.5 % Final   Platelets 05/05/2019 147* 150 - 400 K/uL Final   nRBC 05/05/2019 0.0  0.0 - 0.2 % Final   Neutrophils Relative % 05/05/2019 75  % Final   Neutro Abs 05/05/2019 2.0  1.7 - 7.7 K/uL Final   Lymphocytes Relative 05/05/2019 14  % Final   Lymphs Abs 05/05/2019 0.4* 0.7 - 4.0 K/uL Final   Monocytes Relative 05/05/2019 5  % Final   Monocytes Absolute 05/05/2019 0.1  0.1 - 1.0 K/uL Final   Eosinophils Relative 05/05/2019 6  % Final   Eosinophils Absolute 05/05/2019 0.2  0.0 - 0.5 K/uL Final   Basophils Relative 05/05/2019 0  % Final   Basophils Absolute 05/05/2019 0.0  0.0 - 0.1 K/uL Final   Immature Granulocytes 05/05/2019 0  % Final   Abs Immature Granulocytes 05/05/2019 0.01  0.00 - 0.07 K/uL Final   Performed at [facility], [place] [address] [place], [place] [address]   Sodium 05/05/2019 131* 135 -  145 mmol/L Final   Potassium 05/05/2019 4.2  3.5 - 5.1 mmol/L Final   Chloride 05/05/2019 104  98 - 111 mmol/L Final   CO2 05/05/2019 19* 22 - 32 mmol/L Final   Glucose, Bld 05/05/2019 194* 70 - 99 mg/dL Final   BUN 05/05/2019 57* 8 - 23 mg/dL Final   Creatinine 05/05/2019 1.99* 0.44 - 1.00 mg/dL Final   Calcium 05/05/2019 8.4* 8.9 - 10.3 mg/dL Final   Total Protein 05/05/2019 6.8  6.5 - 8.1 g/dL Final   Albumin 05/05/2019 2.8* 3.5 - 5.0 g/dL Final   AST 05/05/2019 20  15 - 41 U/L Final   ALT 05/05/2019 10  0 - 44 U/L Final   Alkaline Phosphatase 05/05/2019 83  38 - 126 U/L Final   Total Bilirubin 05/05/2019 0.5  0.3 - 1.2 mg/dL Final   GFR, Est Non Af Am 05/05/2019 23* >60 mL/min Final   GFR, Est AFR Am 05/05/2019 27* >60 mL/min Final   Anion gap 05/05/2019 8  5 - 15 Final   Performed at [facility], [place] [address] [place], [place] [address]   Retic Ct Pct 05/05/2019 2.9  0.4 - 3.1 % Final   RBC. 05/05/2019 2.21*  3.87 - 5.11 MIL/uL Final   Retic Count, Absolute 05/05/2019 63.4  19.0 - 186.0 K/uL Final   Immature Retic Fract 05/05/2019 16.2* 2.3 - 15.9 % Final   Performed at [facility], [place] [address] [place], [place] [address]   Smear Review 05/05/2019 SMEAR STAINED AND AVAILABLE FOR REVIEW   Final   Performed at [facility], [place] [address] [place], [place] [address]    (this displays the last labs from the last 3 days)  No results found for: TOTALPROTELP, ALBUMINELP, A1GS, A2GS, BETS, BETA2SER, GAMS, MSPIKE, SPEI (this displays SPEP labs)  No results found for: KPAFRELGTCHN, LAMBDASER, KAPLAMBRATIO (kappa/lambda light chains)  No results found for: HGBA, HGBA2QUANT, HGBFQUANT, HGBSQUAN (Hemoglobinopathy evaluation)   Lab Results  Component Value Date   LDH 197 (H) 02/27/2019    Lab Results  Component Value Date   IRON 22 (L) 04/28/2019   TIBC 260 04/28/2019    IRONPCTSAT 8 (L) 04/28/2019   (Iron and TIBC)  Lab Results  Component Value Date   FERRITIN 156 04/28/2019    Urinalysis    Component Value Date/Time   COLORURINE YELLOW 03/09/2019 2119   APPEARANCEUR CLEAR 03/09/2019 2119   LABSPEC 1.006 03/09/2019 2119   PHURINE 7.0 03/09/2019 2119   [place] 03/09/2019 2119   [place] (A) 03/09/2019 2119   [place] 03/09/2019 2119   [place] 03/09/2019 2119   PROTEINUR 30 (A) 03/09/2019 2119   UROBILINOGEN 1.0 10/06/2011 1709   NITRITE NEGATIVE 03/09/2019 2119   LEUKOCYTESUR NEGATIVE 03/09/2019 2119     STUDIES:  No results found.   ELIGIBLE FOR AVAILABLE RESEARCH PROTOCOL: no   ASSESSMENT: 78 y.o. [place] woman with multifactorial anemia, as follows:             (a) anemia of chronic inflammation: As of May 2020 she has a SED rate of 95, CRP 1.1, positive ANA and RF; she has been referred to rheumatology with an appointment pending 03/27/2019 inflammation makes iron stores relatively inaccessible for hematopoiesis and blunts the effect of native EPO             (b) hemolysis (mild): she has a mildly elevated LDH and a DAT positive for complement, not IgG; this is c/w (a) above             (c) anemia of renal insufficiency: inadequate EPO resonse to anemia            (d) occult  (d) GIB/ iron deficiency in patient on lifelong anticoagulaton  (1) Feraheme received 03/01/2019, to be repeated 03/20/2019   (a) reticulocyte 03/07/2019 up from 43.4 a year ago to 191.8 after iron infusion  (2) darbepoetin: starting 05/05/2019 at 116mcg given every 2 weeks.      PLAN: Isabele is more anemic today than she was last week.  Her hemoglobin is now 6.5.  She will need a blood transfusion.  Due to her heart condition and need for diuresis, we will give her one unit of blood and assess her afterwards for whether or not Lasix is needed.    Jameeka has CKD and decreased reticulocyte count.  I have placed orders  for aranesp to be given today at 146mcg every 2 weeks.  I reviewed the risks/benefits of Aranesp with Lenia in detail, including black box warning of stroke and she is willing to proceed.  I gave her detailed information in her AVS about the Aranesp as well.  Tyan's iron levels are pending and we will get these every 8 weeks.  We will continue to see her weekly for labs, and the hope is that as her rheumatologic issues improve, that her hematologic issues will also.    Zarielle knows to call for any questions or concerns prior to her next appointment with [place].  She was recommended to continue with the appropriate pandemic precautions. A total of (30) minutes of face-to-face time was spent with this patient with greater than 50% of that time in counseling and care-coordination.   Wilber Bihari, NP 05/05/19 12:05 PM Medical Oncology and Hematology [facility] [address] [place], [place] [address] Tel. [phone]    Fax. [phone]

## 2019-05-05 NOTE — Patient Instructions (Signed)
Blood Transfusion, Adult, Care After This sheet gives you information about how to care for yourself after your procedure. Your doctor may also give you more specific instructions. If you have problems or questions, contact your doctor. Follow these instructions at home:   Take over-the-counter and prescription medicines only as told by your doctor.  Go back to your normal activities as told by your doctor.  Follow instructions from your doctor about how to take care of the area where an IV tube was put into your vein (insertion site). Make sure you: ? Wash your hands with soap and water before you change your bandage (dressing). If there is no soap and water, use hand sanitizer. ? Change your bandage as told by your doctor.  Check your IV insertion site every day for signs of infection. Check for: ? More redness, swelling, or pain. ? More fluid or blood. ? Warmth. ? Pus or a bad smell. Contact a doctor if:  You have more redness, swelling, or pain around the IV insertion site.  You have more fluid or blood coming from the IV insertion site.  Your IV insertion site feels warm to the touch.  You have pus or a bad smell coming from the IV insertion site.  Your pee (urine) turns pink, red, or brown.  You feel weak after doing your normal activities. Get help right away if:  You have signs of a serious allergic or body defense (immune) system reaction, including: ? Itchiness. ? Hives. ? Trouble breathing. ? Anxiety. ? Pain in your chest or lower back. ? Fever, flushing, and chills. ? Fast pulse. ? Rash. ? Watery poop (diarrhea). ? Throwing up (vomiting). ? Dark pee. ? Serious headache. ? Dizziness. ? Stiff neck. ? Yellow color in your face or the white parts of your eyes (jaundice). Summary  After a blood transfusion, return to your normal activities as told by your doctor.  Every day, check for signs of infection where the IV tube was put into your vein.  Some  signs of infection are warm skin, more redness and pain, more fluid or blood, and pus or a bad smell where the needle went in.  Contact your doctor if you feel weak or have any unusual symptoms. This information is not intended to replace advice given to you by your health care provider. Make sure you discuss any questions you have with your health care provider. Document Released: 10/16/2014 Document Revised: 01/30/2018 Document Reviewed: 05/19/2016 Elsevier Patient Education  2020 [employer]  

## 2019-05-05 NOTE — Patient Instructions (Signed)
Darbepoetin Alfa injection What is this medicine? DARBEPOETIN ALFA (dar be POE e tin AL fa) helps your body make more red blood cells. It is used to treat anemia caused by chronic kidney failure and chemotherapy. This medicine may be used for other purposes; ask your health care provider or pharmacist if you have questions. COMMON BRAND NAME(S): Aranesp What should I tell my health care provider before I take this medicine? They need to know if you have any of these conditions:  blood clotting disorders or history of blood clots  cancer patient not on chemotherapy  cystic fibrosis  heart disease, such as angina, heart failure, or a history of a heart attack  hemoglobin level of 12 g/dL or greater  high blood pressure  low levels of folate, iron, or vitamin B12  seizures  an unusual or allergic reaction to darbepoetin, erythropoietin, albumin, hamster proteins, latex, other medicines, foods, dyes, or preservatives  pregnant or trying to get pregnant  breast-feeding How should I use this medicine? This medicine is for injection into a vein or under the skin. It is usually given by a health care professional in a hospital or clinic setting. If you get this medicine at home, you will be taught how to prepare and give this medicine. Use exactly as directed. Take your medicine at regular intervals. Do not take your medicine more often than directed. It is important that you put your used needles and syringes in a special sharps container. Do not put them in a trash can. If you do not have a sharps container, call your pharmacist or healthcare provider to get one. A special MedGuide will be given to you by the pharmacist with each prescription and refill. Be sure to read this information carefully each time. Talk to your pediatrician regarding the use of this medicine in children. While this medicine may be used in children as young as 1 month of age for selected conditions, precautions do  apply. Overdosage: If you think you have taken too much of this medicine contact a poison control center or emergency room at once. NOTE: This medicine is only for you. Do not share this medicine with others. What if I miss a dose? If you miss a dose, take it as soon as you can. If it is almost time for your next dose, take only that dose. Do not take double or extra doses. What may interact with this medicine? Do not take this medicine with any of the following medications:  epoetin alfa This list may not describe all possible interactions. Give your health care provider a list of all the medicines, herbs, non-prescription drugs, or dietary supplements you use. Also tell them if you smoke, drink alcohol, or use illegal drugs. Some items may interact with your medicine. What should I watch for while using this medicine? Your condition will be monitored carefully while you are receiving this medicine. You may need blood work done while you are taking this medicine. This medicine may cause a decrease in vitamin B6. You should make sure that you get enough vitamin B6 while you are taking this medicine. Discuss the foods you eat and the vitamins you take with your health care professional. What side effects may I notice from receiving this medicine? Side effects that you should report to your doctor or health care professional as soon as possible:  allergic reactions like skin rash, itching or hives, swelling of the face, lips, or tongue  breathing problems  changes in   vision  chest pain  confusion, trouble speaking or understanding  feeling faint or lightheaded, falls  high blood pressure  muscle aches or pains  pain, swelling, warmth in the leg  rapid weight gain  severe headaches  sudden numbness or weakness of the face, arm or leg  trouble walking, dizziness, loss of balance or coordination  seizures (convulsions)  swelling of the ankles, feet, hands  unusually weak or  tired Side effects that usually do not require medical attention (report to your doctor or health care professional if they continue or are bothersome):  diarrhea  fever, chills (flu-like symptoms)  headaches  nausea, vomiting  redness, stinging, or swelling at site where injected This list may not describe all possible side effects. Call your doctor for medical advice about side effects. You may report side effects to FDA at 1-800-FDA-1088. Where should I keep my medicine? Keep out of the reach of children. Store in a refrigerator between 2 and 8 degrees C (36 and 46 degrees F). Do not freeze. Do not shake. Throw away any unused portion if using a single-dose vial. Throw away any unused medicine after the expiration date. NOTE: This sheet is a summary. It may not cover all possible information. If you have questions about this medicine, talk to your doctor, pharmacist, or health care provider.  2020 [employer]vier/Gold Standard (2017-10-10 16:44:20)  

## 2019-05-06 ENCOUNTER — Telehealth: Payer: Self-pay | Admitting: Adult Health

## 2019-05-06 LAB — TYPE AND SCREEN
ABO/RH(D): A POS
Antibody Screen: NEGATIVE
Unit division: 0

## 2019-05-06 LAB — BPAM RBC
Blood Product Expiration Date: 202008192359
ISSUE DATE / TIME: 202007271405
Unit Type and Rh: 6200

## 2019-05-06 NOTE — Telephone Encounter (Signed)
I left a message regarding schedule  

## 2019-05-07 ENCOUNTER — Ambulatory Visit (HOSPITAL_BASED_OUTPATIENT_CLINIC_OR_DEPARTMENT_OTHER)
Admission: RE | Admit: 2019-05-07 | Discharge: 2019-05-07 | Disposition: A | Discharge status: Home / Self Care | Payer: Medicare HMO | Source: Ambulatory Visit | Attending: Cardiology | Admitting: Cardiology

## 2019-05-07 ENCOUNTER — Encounter (HOSPITAL_COMMUNITY): Payer: Self-pay

## 2019-05-07 ENCOUNTER — Encounter (HOSPITAL_COMMUNITY): Payer: Self-pay | Admitting: Cardiology

## 2019-05-07 ENCOUNTER — Other Ambulatory Visit: Payer: Self-pay

## 2019-05-07 ENCOUNTER — Observation Stay (HOSPITAL_COMMUNITY)
Admission: EM | Admit: 2019-05-07 | Discharge: 2019-05-09 | Disposition: A | Discharge status: Home / Self Care | Payer: Medicare HMO | Attending: Internal Medicine | Admitting: Internal Medicine

## 2019-05-07 ENCOUNTER — Telehealth: Payer: Self-pay | Admitting: *Deleted

## 2019-05-07 VITALS — BP 150/50 | HR 61 | Wt 124.2 lb

## 2019-05-07 DIAGNOSIS — I700 Atherosclerosis of aorta: Secondary | ICD-10-CM | POA: Insufficient documentation

## 2019-05-07 DIAGNOSIS — N289 Disorder of kidney and ureter, unspecified: Secondary | ICD-10-CM

## 2019-05-07 DIAGNOSIS — I480 Paroxysmal atrial fibrillation: Secondary | ICD-10-CM | POA: Diagnosis not present

## 2019-05-07 DIAGNOSIS — Z881 Allergy status to other antibiotic agents status: Secondary | ICD-10-CM | POA: Insufficient documentation

## 2019-05-07 DIAGNOSIS — E1151 Type 2 diabetes mellitus with diabetic peripheral angiopathy without gangrene: Secondary | ICD-10-CM | POA: Diagnosis not present

## 2019-05-07 DIAGNOSIS — I2510 Atherosclerotic heart disease of native coronary artery without angina pectoris: Secondary | ICD-10-CM

## 2019-05-07 DIAGNOSIS — J189 Pneumonia, unspecified organism: Secondary | ICD-10-CM

## 2019-05-07 DIAGNOSIS — Z87891 Personal history of nicotine dependence: Secondary | ICD-10-CM | POA: Diagnosis not present

## 2019-05-07 DIAGNOSIS — Z79899 Other long term (current) drug therapy: Secondary | ICD-10-CM | POA: Insufficient documentation

## 2019-05-07 DIAGNOSIS — I272 Pulmonary hypertension, unspecified: Secondary | ICD-10-CM | POA: Diagnosis not present

## 2019-05-07 DIAGNOSIS — Z952 Presence of prosthetic heart valve: Secondary | ICD-10-CM | POA: Insufficient documentation

## 2019-05-07 DIAGNOSIS — Z8711 Personal history of peptic ulcer disease: Secondary | ICD-10-CM | POA: Insufficient documentation

## 2019-05-07 DIAGNOSIS — I739 Peripheral vascular disease, unspecified: Secondary | ICD-10-CM | POA: Diagnosis not present

## 2019-05-07 DIAGNOSIS — N189 Chronic kidney disease, unspecified: Secondary | ICD-10-CM | POA: Insufficient documentation

## 2019-05-07 DIAGNOSIS — Z7901 Long term (current) use of anticoagulants: Secondary | ICD-10-CM | POA: Insufficient documentation

## 2019-05-07 DIAGNOSIS — R509 Fever, unspecified: Secondary | ICD-10-CM | POA: Diagnosis not present

## 2019-05-07 DIAGNOSIS — I5033 Acute on chronic diastolic (congestive) heart failure: Secondary | ICD-10-CM | POA: Diagnosis present

## 2019-05-07 DIAGNOSIS — I491 Atrial premature depolarization: Secondary | ICD-10-CM | POA: Insufficient documentation

## 2019-05-07 DIAGNOSIS — D692 Other nonthrombocytopenic purpura: Principal | ICD-10-CM | POA: Insufficient documentation

## 2019-05-07 DIAGNOSIS — Z794 Long term (current) use of insulin: Secondary | ICD-10-CM | POA: Insufficient documentation

## 2019-05-07 DIAGNOSIS — R918 Other nonspecific abnormal finding of lung field: Secondary | ICD-10-CM | POA: Diagnosis not present

## 2019-05-07 DIAGNOSIS — I5032 Chronic diastolic (congestive) heart failure: Secondary | ICD-10-CM | POA: Insufficient documentation

## 2019-05-07 DIAGNOSIS — E1122 Type 2 diabetes mellitus with diabetic chronic kidney disease: Secondary | ICD-10-CM | POA: Insufficient documentation

## 2019-05-07 DIAGNOSIS — I440 Atrioventricular block, first degree: Secondary | ICD-10-CM | POA: Diagnosis not present

## 2019-05-07 DIAGNOSIS — D589 Hereditary hemolytic anemia, unspecified: Secondary | ICD-10-CM | POA: Insufficient documentation

## 2019-05-07 DIAGNOSIS — E785 Hyperlipidemia, unspecified: Secondary | ICD-10-CM | POA: Insufficient documentation

## 2019-05-07 DIAGNOSIS — J810 Acute pulmonary edema: Secondary | ICD-10-CM

## 2019-05-07 DIAGNOSIS — I482 Chronic atrial fibrillation, unspecified: Secondary | ICD-10-CM | POA: Insufficient documentation

## 2019-05-07 DIAGNOSIS — I4891 Unspecified atrial fibrillation: Secondary | ICD-10-CM | POA: Insufficient documentation

## 2019-05-07 DIAGNOSIS — D649 Anemia, unspecified: Secondary | ICD-10-CM | POA: Diagnosis not present

## 2019-05-07 DIAGNOSIS — I130 Hypertensive heart and chronic kidney disease with heart failure and stage 1 through stage 4 chronic kidney disease, or unspecified chronic kidney disease: Secondary | ICD-10-CM | POA: Insufficient documentation

## 2019-05-07 DIAGNOSIS — I129 Hypertensive chronic kidney disease with stage 1 through stage 4 chronic kidney disease, or unspecified chronic kidney disease: Secondary | ICD-10-CM | POA: Diagnosis not present

## 2019-05-07 DIAGNOSIS — M109 Gout, unspecified: Secondary | ICD-10-CM | POA: Diagnosis not present

## 2019-05-07 DIAGNOSIS — D61818 Other pancytopenia: Secondary | ICD-10-CM

## 2019-05-07 DIAGNOSIS — N179 Acute kidney failure, unspecified: Secondary | ICD-10-CM | POA: Diagnosis not present

## 2019-05-07 DIAGNOSIS — Z1159 Encounter for screening for other viral diseases: Secondary | ICD-10-CM | POA: Diagnosis not present

## 2019-05-07 DIAGNOSIS — E7800 Pure hypercholesterolemia, unspecified: Secondary | ICD-10-CM | POA: Insufficient documentation

## 2019-05-07 DIAGNOSIS — R0989 Other specified symptoms and signs involving the circulatory and respiratory systems: Secondary | ICD-10-CM | POA: Diagnosis not present

## 2019-05-07 DIAGNOSIS — Z951 Presence of aortocoronary bypass graft: Secondary | ICD-10-CM | POA: Insufficient documentation

## 2019-05-07 DIAGNOSIS — E119 Type 2 diabetes mellitus without complications: Secondary | ICD-10-CM

## 2019-05-07 DIAGNOSIS — R21 Rash and other nonspecific skin eruption: Secondary | ICD-10-CM | POA: Diagnosis present

## 2019-05-07 LAB — CBC
HCT: 23.3 % — ABNORMAL LOW (ref 36.0–46.0)
Hemoglobin: 7.5 g/dL — ABNORMAL LOW (ref 12.0–15.0)
MCH: 29.8 pg (ref 26.0–34.0)
MCHC: 32.2 g/dL (ref 30.0–36.0)
MCV: 92.5 fL (ref 80.0–100.0)
Platelets: 126 10*3/uL — ABNORMAL LOW (ref 150–400)
RBC: 2.52 MIL/uL — ABNORMAL LOW (ref 3.87–5.11)
RDW: 14.9 % (ref 11.5–15.5)
WBC: 3 10*3/uL — ABNORMAL LOW (ref 4.0–10.5)
nRBC: 0 % (ref 0.0–0.2)

## 2019-05-07 MED ORDER — TADALAFIL 20 MG PO TABS: 20.0000 mg | ORAL_TABLET | Freq: Two times a day (BID) | ORAL | 3 refills | Status: DC

## 2019-05-07 MED ORDER — METHYLPREDNISOLONE 4 MG PO TBPK: ORAL_TABLET | ORAL | 0 refills | Status: DC

## 2019-05-07 MED ORDER — SODIUM CHLORIDE 0.9% FLUSH
3.0000 mL | Freq: Once | INTRAVENOUS | Status: AC
  Administered 2019-05-08: 3 mL via INTRAVENOUS

## 2019-05-07 NOTE — Patient Instructions (Signed)
NO medication changes today!  Labs today and repeat in 2 weeks We will only contact you if something comes back abnormal or we need to make some changes. Otherwise no news is good news!   You have been ordered a PYP Scan.  This is done in the Radiology Department of [facility].  When you come for this test please plan to be there 2-3 hours.  You will be called to schedule this appointment.  Your physician recommends that you schedule a follow-up appointment in: 3 months with Dr Aundra Dubin  At the [facility], you and your health needs are our priority. As part of our continuing mission to provide you with exceptional heart care, we have created designated Provider Care Teams. These Care Teams include your primary Cardiologist (physician) and Advanced Practice Providers (APPs- Physician Assistants and Nurse Practitioners) who all work together to provide you with the care you need, when you need it.   You may see any of the following providers on your designated Care Team at your next follow up: [name] Dr Glori Bickers . Dr Loralie Champagne . [name], NP

## 2019-05-07 NOTE — ED Notes (Signed)
Please call Rodena Piety upon release, no matter what time - Neighbor who will pick her up

## 2019-05-07 NOTE — Telephone Encounter (Signed)
"  [name] [phone]).  Rash noted 9:00 pm last night to my legs and back.    Injection and blood received Monday.    Red blood spots, some as large as a pencil eraser to my back and legs from my knees up.  My back itches but is tolerable.    No pain or swelling.    No new soaps, lotions or medications other than the injection.    Never had rash to legs before.  May have had a rash to back when hospitalized in June and received blood but not to my legs.    Okay to leave message with return call.  I have an Echo and visit with Dr. Marigene Ehlers this morning. Benadryl on hand if needed but Monday's Benadryl made me want to sleep for two days."

## 2019-05-07 NOTE — ED Triage Notes (Signed)
Pt reports generalized weakness, fever and rash for the past 2 days. Pt recently started on a medication "for my red blood cells, but I dont know if I am having a reaction to it or not". Pt denies and cough/sob. Pt ill appearing in triage.

## 2019-05-07 NOTE — Progress Notes (Signed)
  Echocardiogram 2D Echocardiogram has been performed.  [name] 05/07/2019, 11:08 AM

## 2019-05-07 NOTE — Telephone Encounter (Signed)
Spoke with pt by phone regarding below message.  Pt has no new symptoms to report and states rash "about the same". Per Dr Jana Hakim a prescription for Medrol dose pack has been sent to her pharmacy.  Pt made aware and instructed to call us tomorrow or early Friday morning to let us know how she is doing.  Pt verbalizes understanding.

## 2019-05-08 ENCOUNTER — Ambulatory Visit: Payer: Self-pay | Admitting: *Deleted

## 2019-05-08 ENCOUNTER — Emergency Department (HOSPITAL_COMMUNITY): Payer: Medicare HMO

## 2019-05-08 ENCOUNTER — Other Ambulatory Visit (HOSPITAL_COMMUNITY): Payer: Self-pay

## 2019-05-08 ENCOUNTER — Other Ambulatory Visit: Payer: Self-pay

## 2019-05-08 ENCOUNTER — Other Ambulatory Visit: Payer: Self-pay | Admitting: *Deleted

## 2019-05-08 DIAGNOSIS — D692 Other nonthrombocytopenic purpura: Secondary | ICD-10-CM | POA: Diagnosis not present

## 2019-05-08 DIAGNOSIS — R21 Rash and other nonspecific skin eruption: Secondary | ICD-10-CM | POA: Diagnosis not present

## 2019-05-08 DIAGNOSIS — R918 Other nonspecific abnormal finding of lung field: Secondary | ICD-10-CM | POA: Diagnosis not present

## 2019-05-08 DIAGNOSIS — R0989 Other specified symptoms and signs involving the circulatory and respiratory systems: Secondary | ICD-10-CM | POA: Diagnosis not present

## 2019-05-08 DIAGNOSIS — N289 Disorder of kidney and ureter, unspecified: Secondary | ICD-10-CM

## 2019-05-08 LAB — BASIC METABOLIC PANEL
Anion gap: 12 (ref 5–15)
Anion gap: 8 (ref 5–15)
BUN: 59 mg/dL — ABNORMAL HIGH (ref 8–23)
BUN: 56 mg/dL — ABNORMAL HIGH (ref 8–23)
CO2: 16 mmol/L — ABNORMAL LOW (ref 22–32)
CO2: 21 mmol/L — ABNORMAL LOW (ref 22–32)
Calcium: 8.8 mg/dL — ABNORMAL LOW (ref 8.9–10.3)
Calcium: 9.1 mg/dL (ref 8.9–10.3)
Chloride: 100 mmol/L (ref 98–111)
Chloride: 103 mmol/L (ref 98–111)
Creatinine, Ser: 2.06 mg/dL — ABNORMAL HIGH (ref 0.44–1.00)
Creatinine, Ser: 1.84 mg/dL — ABNORMAL HIGH (ref 0.44–1.00)
GFR calc Af Amer: 26 mL/min — ABNORMAL LOW (ref >60)
GFR calc Af Amer: 29 mL/min — ABNORMAL LOW (ref >60)
GFR calc non Af Amer: 22 mL/min — ABNORMAL LOW (ref >60)
GFR calc non Af Amer: 25 mL/min — ABNORMAL LOW (ref >60)
Glucose, Bld: 175 mg/dL — ABNORMAL HIGH (ref 70–99)
Glucose, Bld: 119 mg/dL — ABNORMAL HIGH (ref 70–99)
Potassium: 4.3 mmol/L (ref 3.5–5.1)
Potassium: 3.8 mmol/L (ref 3.5–5.1)
Sodium: 128 mmol/L — ABNORMAL LOW (ref 135–145)
Sodium: 132 mmol/L — ABNORMAL LOW (ref 135–145)

## 2019-05-08 LAB — SARS CORONAVIRUS 2 BY RT PCR (HOSPITAL ORDER, PERFORMED IN ~~LOC~~ HOSPITAL LAB): SARS Coronavirus 2: NEGATIVE

## 2019-05-08 LAB — URINALYSIS, ROUTINE W REFLEX MICROSCOPIC
Bilirubin Urine: NEGATIVE
Glucose, UA: NEGATIVE mg/dL
Ketones, ur: NEGATIVE mg/dL
Nitrite: NEGATIVE
Protein, ur: 100 mg/dL — AB
Specific Gravity, Urine: 1.01 (ref 1.005–1.030)
pH: 5 (ref 5.0–8.0)

## 2019-05-08 LAB — HEPATIC FUNCTION PANEL
ALT: 14 U/L (ref 0–44)
AST: 26 U/L (ref 15–41)
Albumin: 2.8 g/dL — ABNORMAL LOW (ref 3.5–5.0)
Alkaline Phosphatase: 73 U/L (ref 38–126)
Bilirubin, Direct: 0.3 mg/dL — ABNORMAL HIGH (ref 0.0–0.2)
Indirect Bilirubin: 0.8 mg/dL (ref 0.3–0.9)
Total Bilirubin: 1.1 mg/dL (ref 0.3–1.2)
Total Protein: 6.7 g/dL (ref 6.5–8.1)

## 2019-05-08 LAB — PROCALCITONIN: Procalcitonin: 0.25 ng/mL

## 2019-05-08 LAB — GLUCOSE, CAPILLARY: Glucose-Capillary: 198 mg/dL — ABNORMAL HIGH (ref 70–99)

## 2019-05-08 LAB — CBG MONITORING, ED
Glucose-Capillary: 134 mg/dL — ABNORMAL HIGH (ref 70–99)
Glucose-Capillary: 79 mg/dL (ref 70–99)
Glucose-Capillary: 120 mg/dL — ABNORMAL HIGH (ref 70–99)

## 2019-05-08 LAB — IMMUNOFIXATION, URINE

## 2019-05-08 LAB — SEDIMENTATION RATE: Sed Rate: 114 mm/hr — ABNORMAL HIGH (ref 0–22)

## 2019-05-08 LAB — BRAIN NATRIURETIC PEPTIDE: B Natriuretic Peptide: 254 pg/mL — ABNORMAL HIGH (ref 0.0–100.0)

## 2019-05-08 LAB — PROTIME-INR
INR: 3.1 — ABNORMAL HIGH (ref 0.8–1.2)
Prothrombin Time: 31.3 seconds — ABNORMAL HIGH (ref 11.4–15.2)

## 2019-05-08 LAB — C-REACTIVE PROTEIN: CRP: 2.5 mg/dL — ABNORMAL HIGH (ref <1.0)

## 2019-05-08 LAB — LACTIC ACID, PLASMA: Lactic Acid, Venous: 1.4 mmol/L (ref 0.5–1.9)

## 2019-05-08 MED ORDER — TADALAFIL 20 MG PO TABS: 20.0000 mg | ORAL_TABLET | Freq: Two times a day (BID) | ORAL | 3 refills | Status: DC

## 2019-05-08 MED ORDER — SPIRONOLACTONE 25 MG PO TABS
25.0000 mg | ORAL_TABLET | Freq: Every day | ORAL | Status: DC
  Administered 2019-05-08 – 2019-05-09 (×2): 25 mg via ORAL
  Filled 2019-05-08 (×2): qty 1

## 2019-05-08 MED ORDER — VITAMIN B-12 1000 MCG PO TABS
1000.0000 ug | ORAL_TABLET | Freq: Every day | ORAL | Status: DC
  Administered 2019-05-09: 1000 ug via ORAL
  Filled 2019-05-08: qty 1

## 2019-05-08 MED ORDER — VITAMIN C 500 MG PO TABS
500.0000 mg | ORAL_TABLET | Freq: Every day | ORAL | Status: DC
  Administered 2019-05-09: 500 mg via ORAL
  Filled 2019-05-08: qty 1

## 2019-05-08 MED ORDER — METFORMIN HCL 500 MG PO TABS: 500.0000 mg | ORAL_TABLET | Freq: Two times a day (BID) | ORAL | Status: DC

## 2019-05-08 MED ORDER — DOXYCYCLINE HYCLATE 100 MG PO TABS: 100.0000 mg | ORAL_TABLET | Freq: Two times a day (BID) | ORAL | Status: DC

## 2019-05-08 MED ORDER — ONDANSETRON 4 MG PO TBDP: 4.0000 mg | ORAL_TABLET | Freq: Three times a day (TID) | ORAL | Status: DC | PRN

## 2019-05-08 MED ORDER — WARFARIN SODIUM 2.5 MG PO TABS
2.5000 mg | ORAL_TABLET | Freq: Once | ORAL | Status: AC
  Administered 2019-05-08: 2.5 mg via ORAL
  Filled 2019-05-08 (×2): qty 1

## 2019-05-08 MED ORDER — FUROSEMIDE 20 MG PO TABS
80.0000 mg | ORAL_TABLET | Freq: Every day | ORAL | Status: DC
  Administered 2019-05-08 – 2019-05-09 (×2): 80 mg via ORAL
  Filled 2019-05-08 (×2): qty 4

## 2019-05-08 MED ORDER — ATORVASTATIN CALCIUM 80 MG PO TABS
80.0000 mg | ORAL_TABLET | Freq: Every day | ORAL | Status: DC
  Administered 2019-05-08: 80 mg via ORAL
  Filled 2019-05-08: qty 1

## 2019-05-08 MED ORDER — HYDRALAZINE HCL 25 MG PO TABS
100.0000 mg | ORAL_TABLET | Freq: Three times a day (TID) | ORAL | Status: DC
  Administered 2019-05-08 – 2019-05-09 (×3): 100 mg via ORAL
  Filled 2019-05-08 (×3): qty 4

## 2019-05-08 MED ORDER — METOPROLOL TARTRATE 12.5 MG HALF TABLET
12.5000 mg | ORAL_TABLET | Freq: Two times a day (BID) | ORAL | Status: DC
  Administered 2019-05-08 – 2019-05-09 (×3): 12.5 mg via ORAL
  Filled 2019-05-08 (×3): qty 1

## 2019-05-08 MED ORDER — SODIUM CHLORIDE 0.9 % IV SOLN
2.0000 g | Freq: Once | INTRAVENOUS | Status: AC
  Administered 2019-05-08: 2 g via INTRAVENOUS
  Filled 2019-05-08: qty 2

## 2019-05-08 MED ORDER — ALPRAZOLAM 0.25 MG PO TABS
0.2500 mg | ORAL_TABLET | Freq: Every day | ORAL | Status: DC
  Administered 2019-05-08: 0.25 mg via ORAL
  Filled 2019-05-08: qty 1

## 2019-05-08 MED ORDER — INSULIN ASPART 100 UNIT/ML ~~LOC~~ SOLN: 0.0000 [IU] | Freq: Every day | SUBCUTANEOUS | Status: DC

## 2019-05-08 MED ORDER — ALLOPURINOL 100 MG PO TABS
100.0000 mg | ORAL_TABLET | Freq: Every day | ORAL | Status: DC
  Administered 2019-05-08 – 2019-05-09 (×2): 100 mg via ORAL
  Filled 2019-05-08 (×2): qty 1

## 2019-05-08 MED ORDER — VITAMIN D 25 MCG (1000 UNIT) PO TABS
1000.0000 [IU] | ORAL_TABLET | Freq: Every day | ORAL | Status: DC
  Administered 2019-05-09: 1000 [IU] via ORAL
  Filled 2019-05-08: qty 1

## 2019-05-08 MED ORDER — ONDANSETRON HCL 4 MG/2ML IJ SOLN
4.0000 mg | Freq: Four times a day (QID) | INTRAMUSCULAR | Status: DC | PRN
  Administered 2019-05-08: 4 mg via INTRAVENOUS
  Filled 2019-05-08: qty 2

## 2019-05-08 MED ORDER — DOXYCYCLINE HYCLATE 100 MG PO TABS
100.0000 mg | ORAL_TABLET | Freq: Once | ORAL | Status: AC
  Administered 2019-05-08: 100 mg via ORAL
  Filled 2019-05-08: qty 1

## 2019-05-08 MED ORDER — TADALAFIL 20 MG PO TABS
20.0000 mg | ORAL_TABLET | Freq: Two times a day (BID) | ORAL | Status: DC
  Administered 2019-05-08 – 2019-05-09 (×2): 20 mg via ORAL
  Filled 2019-05-08 (×3): qty 1

## 2019-05-08 MED ORDER — FUROSEMIDE 10 MG/ML IJ SOLN
20.0000 mg | Freq: Once | INTRAMUSCULAR | Status: AC
  Administered 2019-05-08: 20 mg via INTRAVENOUS
  Filled 2019-05-08: qty 2

## 2019-05-08 MED ORDER — GLUCERNA SHAKE PO LIQD
237.0000 mL | Freq: Two times a day (BID) | ORAL | Status: DC
  Administered 2019-05-09 (×2): 237 mL via ORAL

## 2019-05-08 MED ORDER — MAGNESIUM OXIDE 400 (241.3 MG) MG PO TABS
400.0000 mg | ORAL_TABLET | Freq: Every day | ORAL | Status: DC
  Administered 2019-05-08 – 2019-05-09 (×2): 400 mg via ORAL
  Filled 2019-05-08 (×2): qty 1

## 2019-05-08 MED ORDER — INSULIN ASPART 100 UNIT/ML ~~LOC~~ SOLN
0.0000 [IU] | Freq: Three times a day (TID) | SUBCUTANEOUS | Status: DC
  Administered 2019-05-09: 2 [IU] via SUBCUTANEOUS

## 2019-05-08 MED ORDER — FERROUS SULFATE 325 (65 FE) MG PO TABS
325.0000 mg | ORAL_TABLET | Freq: Two times a day (BID) | ORAL | Status: DC
  Administered 2019-05-08 – 2019-05-09 (×2): 325 mg via ORAL
  Filled 2019-05-08 (×2): qty 1

## 2019-05-08 MED ORDER — HYDRALAZINE HCL 20 MG/ML IJ SOLN
5.0000 mg | INTRAMUSCULAR | Status: DC | PRN
  Administered 2019-05-08: 5 mg via INTRAVENOUS
  Filled 2019-05-08: qty 1

## 2019-05-08 MED ORDER — VANCOMYCIN HCL IN DEXTROSE 1-5 GM/200ML-% IV SOLN
1000.0000 mg | Freq: Once | INTRAVENOUS | Status: AC
  Administered 2019-05-08: 1000 mg via INTRAVENOUS
  Filled 2019-05-08: qty 200

## 2019-05-08 MED ORDER — WARFARIN - PHARMACIST DOSING INPATIENT: Freq: Every day | Status: DC

## 2019-05-08 MED ORDER — PANTOPRAZOLE SODIUM 40 MG PO TBEC
40.0000 mg | DELAYED_RELEASE_TABLET | Freq: Two times a day (BID) | ORAL | Status: DC
  Administered 2019-05-08 – 2019-05-09 (×2): 40 mg via ORAL
  Filled 2019-05-08 (×2): qty 1

## 2019-05-08 MED ORDER — RISAQUAD PO CAPS
1.0000 | ORAL_CAPSULE | Freq: Every day | ORAL | Status: DC
  Administered 2019-05-08 – 2019-05-09 (×2): 1 via ORAL
  Filled 2019-05-08 (×2): qty 1

## 2019-05-08 MED ORDER — SODIUM CHLORIDE 0.9 % IV BOLUS (SEPSIS)
500.0000 mL | Freq: Once | INTRAVENOUS | Status: DC
  Administered 2019-05-08: 500 mL via INTRAVENOUS

## 2019-05-08 MED ORDER — ACETAMINOPHEN 325 MG PO TABS
650.0000 mg | ORAL_TABLET | Freq: Four times a day (QID) | ORAL | Status: DC | PRN
  Administered 2019-05-08 – 2019-05-09 (×3): 650 mg via ORAL
  Filled 2019-05-08 (×3): qty 2

## 2019-05-08 MED ORDER — ACETAMINOPHEN 650 MG RE SUPP: 650.0000 mg | Freq: Four times a day (QID) | RECTAL | Status: DC | PRN

## 2019-05-08 NOTE — ED Notes (Signed)
Lunch tray ordered 

## 2019-05-08 NOTE — Patient Outreach (Signed)
[name]) Care Management  05/08/2019  [name] [birth year] [id number]   Member scheduled for outreach to follow up telephone assessment however she's currently in the ED waiting to be admitted.  Presented with fever and rash after receiving blood transfusion on 7/27.  Will notify hospital liaisons of member's pending admission, will follow up with member pending discharge.  [name], [place], MSN [place] [phone]

## 2019-05-08 NOTE — ED Notes (Signed)
ED TO INPATIENT HANDOFF REPORT  ED Nurse Name and Phone #:  Patty [phone]  S Name/Age/Gender [name] 78 y.o. female Room/Bed: 056C/056C  Code Status   Code Status: Full Code  Home/SNF/Other Home Patient oriented to: self, place, time and situation Is this baseline? Yes   Triage Complete: Triage complete  Chief Complaint fever/body ache/multiple symptoms   Triage Note Pt reports generalized weakness, fever and rash for the past 2 days. Pt recently started on a medication "for my red blood cells, but I dont know if I am having a reaction to it or not". Pt denies and cough/sob. Pt ill appearing in triage.    Allergies Allergies  Allergen Reactions  . Flagyl [Metronidazole] Rash    ALL-OVER BODY RASH  . Coreg [Carvedilol] Other (See Comments)    Terrible cramping in the feet and had a lot of bowel movements, but not diarrhea  . Losartan Swelling    Patient doesn't recall site of swelling  . Verapamil Hives  . Zetia [Ezetimibe] Other (See Comments)    Reaction not recalled  . Zocor [Simvastatin - High Dose] Other (See Comments)    Reaction not recalled    Level of Care/Admitting Diagnosis ED Disposition    ED Disposition Condition [facility]: [place] [[id number]]  Level of Care: Telemetry Medical [104]  Covid Evaluation: Confirmed COVID Negative  Diagnosis: Rash [[id number]]  Admitting Physician: Shela Leff [[id number]]  Attending Physician: Shela Leff [[id number]]  Estimated length of stay: past midnight tomorrow  Certification:: I certify this patient will need inpatient services for at least 2 midnights  PT Class (Do Not Modify): Inpatient [101]  PT Acc Code (Do Not Modify): Private [1]       B Medical/Surgery History Past Medical History:  Diagnosis Date  . Aortic atherosclerosis ([place]) 01/23/2018  . Atrial fibrillation ([place])   . Carotid artery disease ([place])   . Cholelithiasis 01/23/2018  . Chronic  anticoagulation   . Coronary artery disease    status post coronary artery bypass grafting times 07/10/2004  . Diabetes mellitus   . Hypercholesteremia   . Hypertension   . Mechanical heart valve present    H. aortic valve replacement at the time of bypass surgery October 2005  . Moderate to severe pulmonary hypertension ([place])   . Peripheral arterial disease ([facility])    history of left common iliac artery PTA and stenting for a chronic total occlusion 08/26/01   Past Surgical History:  Procedure Laterality Date  . AORTIC VALVE REPLACEMENT    . CARDIAC CATHETERIZATION  11/10/2004   40% right common illiac, 70% in stent restenosis of distal left common illiac,   . CARDIAC CATHETERIZATION  05/18/2004   LAD 50-70% midstenosis, RCA dominant w/50% stenosis, 50% Right common Illiac artery ostial stenosis, 90% in stent restenosis within midportion of left common illiac stent  . Carotid Duplex  03/12/2012   RSA-elev. velocities suggestive of a 50-69% diameter reduction, Right&Left Bulb/Prox ICA-mild-mod.fibrous plaqueelevating Velocities abnormal study.  . CHOLECYSTECTOMY N/A 03/01/2018   Procedure: LAPAROSCOPIC CHOLECYSTECTOMY;  Surgeon: Judeth Horn, MD;  Location: [facility];  Service: General;  Laterality: N/A;  . COLONOSCOPY WITH PROPOFOL N/A 01/22/2018   Procedure: COLONOSCOPY WITH PROPOFOL;  Surgeon: Wilford Corner, MD;  Location: [facility];  Service: Endoscopy;  Laterality: N/A;  . ESOPHAGOGASTRODUODENOSCOPY (EGD) WITH PROPOFOL N/A 01/22/2018   Procedure: ESOPHAGOGASTRODUODENOSCOPY (EGD) WITH PROPOFOL;  Surgeon: Wilford Corner, MD;  Location: [facility];  Service: Endoscopy;  Laterality: N/A;  . ESOPHAGOGASTRODUODENOSCOPY (EGD) WITH PROPOFOL N/A 11/21/2018   Procedure: ESOPHAGOGASTRODUODENOSCOPY (EGD) WITH PROPOFOL;  Surgeon: [name], MD;  Location: [facility];  Service: Gastroenterology;  Laterality: N/A;  . ESOPHAGOGASTRODUODENOSCOPY (EGD) WITH PROPOFOL Left 02/27/2019   Procedure:  ESOPHAGOGASTRODUODENOSCOPY (EGD) WITH PROPOFOL;  Surgeon: Arta Silence, MD;  Location: [facility] ENDOSCOPY;  Service: Endoscopy;  Laterality: Left;  . GIVENS CAPSULE STUDY N/A 11/21/2018   Procedure: GIVENS CAPSULE STUDY;  Surgeon: [name], MD;  Location: [facility];  Service: Gastroenterology;  Laterality: N/A;  To be deployed during EGD  . Lower Ext. Duplex  03/12/2012   Right Proximal CIA- vessel narrowing w/elevated velocities 0-49% diameter reduction. Right SFA-mild mixed density plaque throughout vessel.  [name] NM MYOCAR PERF WALL MOTION  05/19/2010   protocol: Persantine, post stress EF 65%, negative for ischemia, low risk scan  . RIGHT HEART CATH N/A 06/27/2018   Procedure: RIGHT HEART CATH;  Surgeon: [name], MD;  Location: [facility];  Service: Cardiovascular;  Laterality: N/A;  . TRANSTHORACIC ECHOCARDIOGRAM  08/29/2012   Moderately calcified annulus of mitral valve, moderate regurg. of both mitral valve and tricuspid valve.      A IV Location/Drains/Wounds Patient Lines/Drains/Airways Status   Active Line/Drains/Airways    Name:   Placement date:   Placement time:   Site:   Days:   Peripheral IV 05/08/19 Left Wrist   05/08/19    0200    Wrist   less than 1   External Urinary Catheter   04/04/19    1135    -   34          Intake/Output Last 24 hours No intake or output data in the 24 hours ending 05/08/19 1541  Labs/Imaging Results for orders placed or performed during the hospital encounter of 05/07/19 (from the past 48 hour(s))  Basic metabolic panel     Status: Abnormal   Collection Time: 05/07/19 11:11 PM  Result Value Ref Range   Sodium 128 (L) 135 - 145 mmol/L   Potassium 4.3 3.5 - 5.1 mmol/L   Chloride 100 98 - 111 mmol/L   CO2 16 (L) 22 - 32 mmol/L   Glucose, Bld 175 (H) 70 - 99 mg/dL   BUN 59 (H) 8 - 23 mg/dL   Creatinine, Ser 2.06 (H) 0.44 - 1.00 mg/dL   Calcium 8.8 (L) 8.9 - 10.3 mg/dL   GFR calc non Af Amer 22 (L) >60 mL/min   GFR calc Af  Amer 26 (L) >60 mL/min   Anion gap 12 5 - 15    Comment: Performed at [facility], [address], [place], [place] [address]  CBC     Status: Abnormal   Collection Time: 05/07/19 11:11 PM  Result Value Ref Range   WBC 3.0 (L) 4.0 - 10.5 K/uL   RBC 2.52 (L) 3.87 - 5.11 MIL/uL   Hemoglobin 7.5 (L) 12.0 - 15.0 g/dL   HCT 23.3 (L) 36.0 - 46.0 %   MCV 92.5 80.0 - 100.0 fL   MCH 29.8 26.0 - 34.0 pg   MCHC 32.2 30.0 - 36.0 g/dL   RDW 14.9 11.5 - 15.5 %   Platelets 126 (L) 150 - 400 K/uL   nRBC 0.0 0.0 - 0.2 %    Comment: Performed at [facility], [place] [address] [place], [place] [address]  Procalcitonin - Baseline     Status: None   Collection Time: 05/08/19  1:00 AM  Result Value Ref Range   Procalcitonin 0.25 ng/mL    Comment:        Interpretation: PCT (Procalcitonin) <= 0.5 ng/mL: Systemic infection (sepsis) is not likely. Local bacterial infection is possible. (NOTE)       Sepsis PCT Algorithm           Lower Respiratory Tract                                      Infection PCT Algorithm    ----------------------------     ----------------------------         PCT < 0.25 ng/mL                PCT < 0.10 ng/mL         Strongly encourage             Strongly discourage   discontinuation of antibiotics    initiation of antibiotics    ----------------------------     -----------------------------       PCT 0.25 - 0.50 ng/mL            PCT 0.10 - 0.25 ng/mL               OR       >80% decrease in PCT            Discourage initiation of                                            antibiotics      Encourage discontinuation           of antibiotics    ----------------------------     -----------------------------         PCT >= 0.50 ng/mL              PCT 0.26 - 0.50 ng/mL               AND        <80% decrease in PCT             Encourage initiation of                                             antibiotics       Encourage continuation           of antibiotics     ----------------------------     -----------------------------        PCT >= 0.50 ng/mL                  PCT > 0.50 ng/mL               AND         increase in PCT                  Strongly encourage                                      initiation of antibiotics    Strongly encourage escalation  of antibiotics                                     -----------------------------                                           PCT <= 0.25 ng/mL                                                 OR                                        > 80% decrease in PCT                                     Discontinue / Do not initiate                                             antibiotics Performed at [facility], [place] [address], [place], [place] [address]   Protime-INR     Status: Abnormal   Collection Time: 05/08/19  2:18 AM  Result Value Ref Range   Prothrombin Time 31.3 (H) 11.4 - 15.2 seconds   INR 3.1 (H) 0.8 - 1.2    Comment: (NOTE) INR goal varies based on device and disease states. Performed at [facility], [place] [address] [place], [place] [address]   Lactic acid, plasma     Status: None   Collection Time: 05/08/19  2:18 AM  Result Value Ref Range   Lactic Acid, Venous 1.4 0.5 - 1.9 mmol/L    Comment: Performed at [facility] [address], [place], [place] [address]  Sedimentation rate     Status: Abnormal   Collection Time: 05/08/19  2:18 AM  Result Value Ref Range   Sed Rate 114 (H) 0 - 22 mm/hr    Comment: Performed at [facility] [address], [place], [place] [address]  C-reactive protein     Status: Abnormal   Collection Time: 05/08/19  2:18 AM  Result Value Ref Range   CRP 2.5 (H) <1.0 mg/dL    Comment: Performed at [facility] [address], [place], [place] [address]  Hepatic function panel     Status: Abnormal   Collection Time: 05/08/19  2:18 AM  Result Value Ref Range   Total Protein 6.7 6.5 - 8.1 g/dL   Albumin 2.8 (L) 3.5 -  5.0 g/dL   AST 26 15 - 41 U/L   ALT 14 0 - 44 U/L   Alkaline Phosphatase 73 38 - 126 U/L   Total Bilirubin 1.1 0.3 - 1.2 mg/dL   Bilirubin, Direct 0.3 (H) 0.0 - 0.2 mg/dL   Indirect Bilirubin 0.8 0.3 - 0.9 mg/dL    Comment: Performed at [facility] [place],  [place] [address]  Brain natriuretic peptide     Status: Abnormal   Collection Time: 05/08/19  2:18 AM  Result Value Ref Range   B Natriuretic Peptide 254.0 (H) 0.0 - 100.0 pg/mL    Comment: Performed at [facility] [address], [place], [place] [address]  SARS Coronavirus 2 (CEPHEID- Performed in [facility] hospital lab), Hosp Order     Status: None   Collection Time: 05/08/19  2:40 AM   Specimen: Nasopharyngeal Swab  Result Value Ref Range   SARS Coronavirus 2 NEGATIVE NEGATIVE    Comment: (NOTE) If result is NEGATIVE SARS-CoV-2 target nucleic acids are NOT DETECTED. The SARS-CoV-2 RNA is generally detectable in upper and lower  respiratory specimens during the acute phase of infection. The lowest  concentration of SARS-CoV-2 viral copies this assay can detect is 250  copies / mL. A negative result does not preclude SARS-CoV-2 infection  and should not be used as the sole basis for treatment or other  patient management decisions.  A negative result may occur with  improper specimen collection / handling, submission of specimen other  than nasopharyngeal swab, presence of viral mutation(s) within the  areas targeted by this assay, and inadequate number of viral copies  (<250 copies / mL). A negative result must be combined with clinical  observations, patient history, and epidemiological information. If result is POSITIVE SARS-CoV-2 target nucleic acids are DETECTED. The SARS-CoV-2 RNA is generally detectable in upper and lower  respiratory specimens dur ing the acute phase of infection.  Positive  results are indicative of active infection with SARS-CoV-2.  Clinical  correlation with  patient history and other diagnostic information is  necessary to determine patient infection status.  Positive results do  not rule out bacterial infection or co-infection with other viruses. If result is PRESUMPTIVE POSTIVE SARS-CoV-2 nucleic acids MAY BE PRESENT.   A presumptive positive result was obtained on the submitted specimen  and confirmed on repeat testing.  While 2019 novel coronavirus  (SARS-CoV-2) nucleic acids may be present in the submitted sample  additional confirmatory testing may be necessary for epidemiological  and / or clinical management purposes  to differentiate between  SARS-CoV-2 and other Sarbecovirus currently known to infect humans.  If clinically indicated additional testing with an alternate test  methodology [phone]) is advised. The SARS-CoV-2 RNA is generally  detectable in upper and lower respiratory sp ecimens during the acute  phase of infection. The expected result is Negative. Fact Sheet for Patients:  StrictlyIdeas.no Fact Sheet for Healthcare Providers: BankingDealers.co.za This test is not yet approved or cleared by the [place] FDA and has been authorized for detection and/or diagnosis of SARS-CoV-2 by FDA under an Emergency Use Authorization (EUA).  This EUA will remain in effect (meaning this test can be used) for the duration of the COVID-19 declaration under Section 564(b)(1) of the Act, 21 U.S.C. section 360bbb-3(b)(1), unless the authorization is terminated or revoked sooner. Performed at [facility], [place] [address] [place], [place] [address]   Urinalysis, Routine w reflex microscopic     Status: Abnormal   Collection Time: 05/08/19  7:15 AM  Result Value Ref Range   Color, Urine YELLOW YELLOW   APPearance HAZY (A) CLEAR   Specific Gravity, Urine 1.010 1.005 - 1.030   pH 5.0 5.0 - 8.0   Glucose, UA NEGATIVE NEGATIVE mg/dL   Hgb urine dipstick MODERATE (A) NEGATIVE    Bilirubin Urine NEGATIVE NEGATIVE   Ketones,  ur NEGATIVE NEGATIVE mg/dL   Protein, ur 100 (A) NEGATIVE mg/dL   Nitrite NEGATIVE NEGATIVE   Leukocytes,Ua TRACE (A) NEGATIVE   RBC / HPF 21-50 0 - 5 RBC/hpf   WBC, UA 6-10 0 - 5 WBC/hpf   Bacteria, UA MANY (A) NONE SEEN   Squamous Epithelial / LPF 0-5 0 - 5   Mucus PRESENT     Comment: Performed at [facility], [place] [address], [place], [place] [address]  CBG monitoring, ED     Status: Abnormal   Collection Time: 05/08/19  8:32 AM  Result Value Ref Range   Glucose-Capillary 134 (H) 70 - 99 mg/dL   Comment 1 Notify RN    Comment 2 Document in Chart   Basic metabolic panel     Status: Abnormal   Collection Time: 05/08/19 12:24 PM  Result Value Ref Range   Sodium 132 (L) 135 - 145 mmol/L   Potassium 3.8 3.5 - 5.1 mmol/L   Chloride 103 98 - 111 mmol/L   CO2 21 (L) 22 - 32 mmol/L   Glucose, Bld 119 (H) 70 - 99 mg/dL   BUN 56 (H) 8 - 23 mg/dL   Creatinine, Ser 1.84 (H) 0.44 - 1.00 mg/dL   Calcium 9.1 8.9 - 10.3 mg/dL   GFR calc non Af Amer 25 (L) >60 mL/min   GFR calc Af Amer 29 (L) >60 mL/min   Anion gap 8 5 - 15    Comment: Performed at [facility] [address], [place], [place] [address]  CBG monitoring, ED     Status: None   Collection Time: 05/08/19  1:00 PM  Result Value Ref Range   Glucose-Capillary 79 70 - 99 mg/dL   Comment 1 Notify RN    Comment 2 Document in Chart    Dg Chest Portable 1 View  Result Date: 05/08/2019 CLINICAL DATA:  Fever EXAM: PORTABLE CHEST 1 VIEW COMPARISON:  04/04/2019, 02/28/2019 FINDINGS: Post sternotomy changes. Cardiomegaly with vascular congestion. Diffuse bilateral interstitial and ground-glass opacity, possible edema. More confluent opacity at the right base may reflect superimposed pneumonia. No pneumothorax. IMPRESSION: 1. Cardiomegaly with vascular congestion and diffuse bilateral interstitial and ground-glass opacity suggesting pulmonary edema. 2. More confluent airspace disease  at the right base, could reflect superimposed pneumonia Electronically Signed   By: Donavan Foil M.D.   On: 05/08/2019 02:25    Pending Labs Unresulted Labs (From admission, onward)    Start     Ordered   05/09/19 8250  Basic metabolic panel  Tomorrow morning,   R     05/08/19 0643   05/09/19 0500  CBC  Tomorrow morning,   R     05/08/19 0649   05/09/19 0500  Protime-INR  Daily,   R     05/08/19 0700   05/08/19 0720  ANCA Titers  (Anti-Neutrophilic Cystoplasmic Antibody Panel (PNL))  Once,   STAT     05/08/19 0719   05/08/19 0720  Mpo/pr-3 (anca) antibodies  (Anti-Neutrophilic Cystoplasmic Antibody Panel (PNL))  Once,   STAT     05/08/19 0719   05/08/19 0720  HIV Antibody (routine testing w rflx)  Once,   STAT     05/08/19 0719   05/08/19 0719  Cryoglobulin  Once,   STAT     05/08/19 0718   05/08/19 0717  Urine culture  Add-on,   AD     05/08/19 0716   05/08/19 0715  Anti-DNA antibody, double-stranded  Once,   STAT     05/08/19 0714   05/08/19 0714  Complement, total  Once,   STAT     05/08/19 0713   05/08/19 0714  C3 complement  Once,   STAT     05/08/19 0713   05/08/19 0714  C4 complement  Once,   STAT     05/08/19 0713   05/08/19 0713  Hepatitis C antibody  Once,   STAT     05/08/19 0712   05/08/19 0713  Hepatitis B core antibody, total  Once,   STAT     05/08/19 0712   05/08/19 0713  Hepatitis B surface antibody  Once,   STAT     05/08/19 0712   05/08/19 0713  Hepatitis B surface antigen  Once,   STAT     05/08/19 0712   05/08/19 0244  Rocky mtn spotted fvr abs pnl(IgG+IgM)  ONCE - STAT,   STAT     05/08/19 0243   05/08/19 0244  Lyme disease dna by pcr(borrelia burg)  ONCE - STAT,   STAT     05/08/19 0243   05/08/19 0208  Blood culture (routine x 2)  BLOOD CULTURE X 2,   STAT     05/08/19 0208          Vitals/Pain Today's Vitals   05/08/19 1000 05/08/19 1100 05/08/19 1200 05/08/19 1502  BP: (!) 188/89 (!) 191/44 (!) 197/49 (!) 196/53  Pulse: 66 65 62 63   Resp: 16 14 20 20   Temp:      TempSrc:      SpO2: 97% 97% 97% 97%  PainSc:        Isolation Precautions No active isolations  Medications Medications  acetaminophen (TYLENOL) tablet 650 mg (has no administration in time range)    Or  acetaminophen (TYLENOL) suppository 650 mg (has no administration in time range)  hydrALAZINE (APRESOLINE) injection 5 mg (has no administration in time range)  insulin aspart (novoLOG) injection 0-9 Units (0 Units Subcutaneous Not Given 05/08/19 1301)  insulin aspart (novoLOG) injection 0-5 Units (has no administration in time range)  Warfarin - Pharmacist Dosing Inpatient (has no administration in time range)  warfarin (COUMADIN) tablet 2.5 mg (has no administration in time range)  Vitamin B12 TBCR 1,000 mcg (has no administration in time range)  ALPRAZolam (XANAX) tablet 0.25 mg (has no administration in time range)  cholecalciferol (VITAMIN D3) tablet 1,000 Units (has no administration in time range)  pantoprazole (PROTONIX) EC tablet 40 mg (has no administration in time range)  magnesium oxide (MAG-OX) tablet 400 mg (has no administration in time range)  furosemide (LASIX) tablet 80 mg (has no administration in time range)  atorvastatin (LIPITOR) tablet 80 mg (has no administration in time range)  hydrALAZINE (APRESOLINE) tablet 100 mg (100 mg Oral Given 05/08/19 1536)  metoprolol tartrate (LOPRESSOR) tablet 12.5 mg (has no administration in time range)  ondansetron (ZOFRAN-ODT) disintegrating tablet 4 mg (has no administration in time range)  Probiotic CAPS (has no administration in time range)  vitamin C (ASCORBIC ACID) tablet 500 mg (has no administration in time range)  ferrous sulfate tablet 325 mg (has no administration in time range)  feeding supplement (GLUCERNA SHAKE) (GLUCERNA SHAKE) liquid 237 mL (has no administration in time range)  allopurinol (ZYLOPRIM) tablet 100 mg (has no administration in time range)  spironolactone (ALDACTONE)  tablet 25 mg (has no administration in time range)  tadalafil (CIALIS) tablet 20 mg (has no administration in time range)  sodium chloride flush (NS) 0.9 % injection 3 mL (3 mLs Intravenous Given 05/08/19 0239)  vancomycin (VANCOCIN) IVPB 1000 mg/200 mL premix (0 mg Intravenous Stopped 05/08/19 0919)  ceFEPIme (MAXIPIME) 2 g in sodium chloride 0.9 % 100 mL IVPB (0 g Intravenous Stopped 05/08/19 0919)  doxycycline (VIBRA-TABS) tablet 100 mg (100 mg Oral Given 05/08/19 0716)  furosemide (LASIX) injection 20 mg (20 mg Intravenous Given 05/08/19 1043)    Mobility walks Low fall risk   Focused Assessments    R Recommendations: See Admitting Provider Note  Report given to:   Additional Notes:

## 2019-05-08 NOTE — ED Provider Notes (Signed)
TIME SEEN: 1:39 AM  CHIEF COMPLAINT: Fever, rash  HPI: Patient is an 78 year old female with history of atrial fibrillation on Coumadin, hypertension, diabetes, hyperlipidemia, CAD status post CABG, aortic valve replacement in 2005, pulmonary hypertension, hemolytic anemia, chronic kidney disease who presents to the emergency department with complaints of chills that started at 6 PM last night.  She states her neighbor checked her temperature and it was 101.8.  She took 2 Tylenol tablets around 8 PM.  States then she began to sweat.  She denies any headache, neck pain or neck stiffness, chest pain, shortness of breath, cough, abdominal pain, vomiting, diarrhea, dysuria.  She has had a rash for the past 2 days.  States rash started on her back and legs 1 day after receiving a blood transfusion (July 27th) and darbepoetin.  She states she thinks she has had a rash after blood transfusion before.  She states her hematologist called in Medrol for her yesterday.  She states she has never had a rash like the one she has in her legs.  Rash on her back is pruritic.  No other new soaps, lotions, detergents, medications, foods.  She denies any recent tick bites.  States the only time she spends outside is sitting on her front porch.   ROS: See HPI Constitutional: fever  Eyes: no drainage  ENT: no runny nose   Cardiovascular:  no chest pain  Resp: no SOB  GI: no vomiting GU: no dysuria Integumentary:  rash  Allergy:  hives  Musculoskeletal: no leg swelling  Neurological: no slurred speech ROS otherwise negative  PAST MEDICAL HISTORY/PAST SURGICAL HISTORY:  Past Medical History:  Diagnosis Date  . Aortic atherosclerosis ([place]) 01/23/2018  . Atrial fibrillation ([place])   . Carotid artery disease ([place])   . Cholelithiasis 01/23/2018  . Chronic anticoagulation   . Coronary artery disease    status post coronary artery bypass grafting times 07/10/2004  . Diabetes mellitus   . Hypercholesteremia   .  Hypertension   . Mechanical heart valve present    H. aortic valve replacement at the time of bypass surgery October 2005  . Moderate to severe pulmonary hypertension ([place])   . Peripheral arterial disease (HCC)    history of left common iliac artery PTA and stenting for a chronic total occlusion 08/26/01    MEDICATIONS:  Prior to Admission medications   Medication Sig Start Date End Date Taking? Authorizing Provider  acetaminophen (TYLENOL) 325 MG tablet Take 325 mg by mouth every 6 (six) hours as needed.    [provider]  allopurinol (ZYLOPRIM) 100 MG tablet Take 100 mg by mouth daily.    [provider]  ALPRAZolam Duanne Moron) 0.5 MG tablet Take 0.25 mg by mouth at bedtime.     [provider]  atorvastatin (LIPITOR) 80 MG tablet Take 1 tablet (80 mg total) by mouth daily at 6 PM. 11/22/18   [name], MD  Cholecalciferol (VITAMIN D3) 1000 UNITS CAPS Take 1,000 Units by mouth daily with lunch.     [provider]  Cyanocobalamin (VITAMIN B12) 1000 MCG TBCR Take 1,000 mcg by mouth daily with lunch.     [provider]  feeding supplement, GLUCERNA SHAKE, (GLUCERNA SHAKE) LIQD Take 237 mLs by mouth 2 (two) times daily between meals. 04/07/19   [name], MD  ferrous sulfate 325 (65 FE) MG tablet Take 1 tablet (325 mg total) by mouth 2 (two) times daily with a meal. 04/07/19   [name], MD  furosemide (LASIX) 40 MG tablet Take 80 mg by mouth daily.    [provider]  hydrALAZINE (APRESOLINE) 100 MG tablet TAKE 1 TABLET (100 MG TOTAL) BY MOUTH 3 (THREE) TIMES DAILY. 01/20/19   [name], MD  magnesium oxide (MAG-OX) 400 (241.3 Mg) MG tablet Take 1 tablet (400 mg total) by mouth daily. 01/30/18   [name], MD  metFORMIN (GLUCOPHAGE) 500 MG tablet Take 500 mg by mouth 2 (two) times daily with a meal.     [provider]  metoprolol tartrate (LOPRESSOR) 25 MG tablet Take 0.5 tablets (12.5 mg total) by mouth 2  (two) times daily. 03/05/19   [place] Eric J, DO  ondansetron (ZOFRAN ODT) 4 MG disintegrating tablet Take 1 tablet (4 mg total) by mouth every 8 (eight) hours as needed for nausea or vomiting. 03/09/19   [name], Kaitlyn E, PA-C  pantoprazole (PROTONIX) 40 MG tablet Take 40 mg by mouth 2 (two) times daily.  12/05/14   [provider]  Probiotic Product (PROBIOTIC PO) Take 1 tablet by mouth daily.    [provider]  spironolactone (ALDACTONE) 25 MG tablet Take 25 mg by mouth daily.    [provider]  tadalafil (CIALIS) 20 MG tablet Take 1 tablet (20 mg total) by mouth 2 (two) times daily. 05/07/19   [name], MD  vitamin C (ASCORBIC ACID) 500 MG tablet Take 500 mg by mouth daily with lunch.    [provider]  warfarin (COUMADIN) 5 MG tablet Take 1.5 tablets on Tues, Thursday, Saturday and Sunday. Take 1 tablet on Mondays, Wednesdays, and Fridays. 04/26/19   [provider]    ALLERGIES:  Allergies  Allergen Reactions  . Flagyl [Metronidazole] Rash    ALL-OVER BODY RASH  . Coreg [Carvedilol] Other (See Comments)    Terrible cramping in the feet and had a lot of bowel movements, but not diarrhea  . Losartan Swelling    Patient doesn't recall site of swelling  . Verapamil Hives  . Zetia [Ezetimibe] Other (See Comments)    Reaction not recalled  . Zocor [Simvastatin - High Dose] Other (See Comments)    Reaction not recalled    SOCIAL HISTORY:  Social History   Tobacco Use  . Smoking status: Former Smoker    Packs/day: 1.00    Years: 30.00    Pack years: 30.00    Types: Cigarettes  . Smokeless tobacco: Never Used  . Tobacco comment: quit smoking 2005  Substance Use Topics  . Alcohol use: No    Alcohol/week: 0.0 standard drinks    FAMILY HISTORY: Family History  Problem Relation Age of Onset  . Breast cancer Neg Hx     EXAM: BP (!) 135/40 (BP Location: Right Arm)   Pulse 71   Temp 98.4 F (36.9 C) (Oral)   Resp 14    SpO2 96%  CONSTITUTIONAL: Alert and oriented and responds appropriately to questions.  Elderly, nontoxic-appearing HEAD: Normocephalic EYES: Conjunctivae clear, pupils appear equal, EOMI ENT: normal nose; moist mucous membranes; No pharyngeal erythema or petechiae, no tonsillar hypertrophy or exudate, no uvular deviation, no unilateral swelling, no trismus or drooling, no muffled voice, normal phonation, no stridor, no dental caries present, no drainable dental abscess noted, no Ludwig's angina, tongue sits flat in the bottom of the mouth, no angioedema, no facial erythema or warmth, no facial swelling; no pain with movement of the neck. NECK: Supple, no meningismus, no nuchal rigidity, no LAD  CARD:  Irregularly irregular; S1 and S2 appreciated; + murmur, no clicks, no rubs, no gallops RESP: Normal chest excursion without splinting or tachypnea; breath sounds clear and equal bilaterally; no wheezes, no rhonchi, no rales, no hypoxia or respiratory distress, speaking full sentences ABD/GI: Normal bowel sounds; non-distended; soft, non-tender, no rebound, no guarding, no peritoneal signs, no hepatosplenomegaly BACK:  The back is non-tender to palpation, there is no CVA tenderness, scattered urticaria noted to patient's back EXT: Normal ROM in all joints; non-tender to palpation; no edema; normal capillary refill; no cyanosis, no calf tenderness or swelling    SKIN: Normal color for age and race; warm; patient has scattered urticaria noted to her back, she has non-blanchable purpura to her bilateral lower extremities, she has diffuse erythema and purpura to the palms of her hands bilaterally, small scattered purpura to her bilateral feet without surrounding erythema, no blisters or desquamation, no bull's-eye rash, no rash or lesions involving her mucous membranes NEURO: Moves all extremities equally PSYCH: [name] mood and manner are appropriate. Grooming and personal hygiene are appropriate.   MEDICAL DECISION MAKING: Patient here with fever at home and rash.  She has urticaria to her back but she has purpura to her palms and lower extremities.  Differential includes vasculitis, RMSF or other tickborne illness, medication reaction, bacteremia.  Doubt meningitis.  Patient does have mild thrombocytopenia with platelet count of 126,000.  She is also on Coumadin.  Will check additional labs today.  Labs obtained in triage show hemoglobin of 7.5 which appears to be her baseline.  Creatinine is elevated at 2.06 which seems to be a slow rise over the past several months.  I will give gentle IV hydration here.  She does not appear volume overloaded.  ED PROGRESS: Patient's chest x-ray shows cardiomegaly with diffuse bilateral interstitial and groundglass opacities suggestive of pulmonary edema but there is also an airspace opacity in the right base that could reflect pneumonia.  I have stopped her IV fluids and she has received only 200 mL.  No shortness of breath or hypoxia.  Will add on a BNP.  EKG shows no ischemic abnormality.  Patient's inflammatory markers are elevated.  COVID is negative.  Will give vancomycin, cefepime for broad coverage for possible healthcare associated pneumonia and also give doxycycline in case this is a tickborne illness.  Will discuss with medicine for admission.  5:09 AM Discussed patient's case with hospitalist, Dr. Marlowe Sax.  I have recommended admission and patient (and family if present) agree with this plan. Admitting physician will place admission orders.   I reviewed all nursing notes, vitals, pertinent previous records, EKGs, lab and urine results, imaging (as available).              Date: 05/08/2019 3:05 AM  Rate: 60  Rhythm: normal sinus rhythm  QRS Axis: normal  Intervals: normal  ST/T Wave abnormalities: normal  Conduction Disutrbances: LPFB  Narrative Interpretation: LFPB, no change compared to prior    CRITICAL CARE Performed by:  Pryor Curia   Total critical care time: 45 minutes  Critical care time was exclusive of separately billable procedures and treating other patients.  Critical care was necessary to treat or prevent imminent or life-threatening deterioration.  Critical care was time spent personally by me on the following activities: development of treatment plan with patient and/or surrogate as well as nursing, discussions with consultants, evaluation of patient's response to treatment, examination of patient, obtaining history from patient or surrogate, ordering and performing treatments and  interventions, ordering and review of laboratory studies, ordering and review of radiographic studies, pulse oximetry and re-evaluation of patient's condition.    Baird Polinski, Delice Bison, DO 05/08/19 [phone]

## 2019-05-08 NOTE — Progress Notes (Signed)
  PROGRESS NOTE    [name]  PXT:[id number] DOB: 19-Oct-1940 DOA: 05/07/2019 PCP: [name], MD   Brief Narrative:  Please see H&P from earlier this morning for complete history and HPI.  Patient presents with fevers chills rash concerning for disseminated infection versus medication reaction.  Patient did have a recent blood transfusion and "blood-shot" per her limited history which appears to be EPO.  Patient subsequently noted rash the following day but did not present to the ED for 5 additional days.  Patient also presented on 2 weeks of allopurinol which is a new medication for her.  Patient presents with throat pain as well as itchy throat concerning for airway involvement but has no stridor, no oral lesions, is not hypoxic.  Patient to be monitored overnight to ensure resolution of rash and to ensure no worsening airway compromise.  Assessment & Plan:   Principal Problem:   Rash Active Problems:   Anemia   Acute on chronic diastolic heart failure (HCC)   Diabetes mellitus type 2, controlled ([place])   Renal insufficiency   LOS: 0 days   Time spent: 58min  [name] C [name] Kohles, DO [facility]  If 7PM-7AM, please contact night-coverage [email or web address] Password [email or web address] 05/08/2019, 3:24 PM

## 2019-05-08 NOTE — Progress Notes (Signed)
HEMATOLOGY-ONCOLOGY PROGRESS NOTE  SUBJECTIVE: [name] is an 78 year old female who is followed by hematology for anemia that is multifactorial.  The patient was last seen at the cancer center on 05/05/2019.  On that date, she received 1 unit packed red blood cells Aranesp 150 mcg to be given every 2 weeks.  The patient presented to the emergency room with fevers, chills, rash.  The patient states that she noticed that her rash developed this past Tuesday (the day after her visit at the cancer) and started on her legs.  Rash was pruritic.  Rash subsequently started to spread to other areas including her hands, chest, and back.  She contacted our office yesterday regarding her rash and was given steroids.  She reports taking her first dose last evening. Yesterday, she developed a fever up to 101.8 with chills.  She presented to the emergency room for further evaluation.  In the emergency room, the patient is afebrile.  White blood cell count was 3.0, hemoglobin 7.5, and platelet count 126,000.  Sodium 128, BUN was 59 and creatinine 2.06.  Her C-reactive protein is elevated at 2.5, ESR 114, BNP 254.  Blood cultures and urine culture have been obtained.  Additionally, work-up for Lyme disease has been ordered.  When seen today, the patient reports that she feels somewhat better.  She denies any new medications other than the Aranesp that she received in our office earlier this week.  She has been on allopurinol and colchicine which are newer medications for her, but the patient thinks she has been on these for at least 1 to 2 weeks.  Denies new soaps and detergents.  Denies tick bites.  She otherwise is feeling better overall and remains afebrile since coming to the hospital without chills.  Denies bleeding.  REVIEW OF SYSTEMS:   A comprehensive 14 point review of systems was negative except as noted in the HPI. I have reviewed the past medical history, past surgical history, social history and family history  with the patient and they are unchanged from previous note.  PHYSICAL EXAMINATION:  Vitals:   05/08/19 1100 05/08/19 1200  BP: (!) 191/44 (!) 197/49  Pulse: 65 62  Resp: 14 20  Temp:    SpO2: 97% 97%   There were no vitals filed for this visit.  Intake/Output from previous day: No intake/output data recorded.  GENERAL:alert, no distress and comfortable SKIN: See photos below OROPHARYNX: No oral lesions LYMPH:  no palpable lymphadenopathy in the cervical, axillary or inguinal LUNGS: Rales noted at the bases HEART: regular rate & rhythm, murmur noted and 1+ lower extremity edema ABDOMEN:abdomen soft, non-tender and normal bowel sounds Musculoskeletal:no cyanosis of digits and no clubbing  NEURO: alert & oriented x 3 with fluent speech, no focal motor/sensory deficits          LABORATORY DATA:  I have reviewed the data as listed CMP Latest Ref Rng & Units 05/08/2019 05/07/2019 05/05/2019  Glucose 70 - 99 mg/dL 119(H) 175(H) 194(H)  BUN 8 - 23 mg/dL 56(H) 59(H) 57(H)  Creatinine 0.44 - 1.00 mg/dL 1.84(H) 2.06(H) 1.99(H)  Sodium 135 - 145 mmol/L 132(L) 128(L) 131(L)  Potassium 3.5 - 5.1 mmol/L 3.8 4.3 4.2  Chloride 98 - 111 mmol/L 103 100 104  CO2 22 - 32 mmol/L 21(L) 16(L) 19(L)  Calcium 8.9 - 10.3 mg/dL 9.1 8.8(L) 8.4(L)  Total Protein 6.5 - 8.1 g/dL 6.7 - 6.8  Total Bilirubin 0.3 - 1.2 mg/dL 1.1 - 0.5  Alkaline Phos 38 -  126 U/L 73 - 83  AST 15 - 41 U/L 26 - 20  ALT 0 - 44 U/L 14 - 10    Lab Results  Component Value Date   WBC 3.0 (L) 05/07/2019   HGB 7.5 (L) 05/07/2019   HCT 23.3 (L) 05/07/2019   MCV 92.5 05/07/2019   PLT 126 (L) 05/07/2019   NEUTROABS 2.0 05/05/2019    Dg Chest Portable 1 View  Result Date: 05/08/2019 CLINICAL DATA:  Fever EXAM: PORTABLE CHEST 1 VIEW COMPARISON:  04/04/2019, 02/28/2019 FINDINGS: Post sternotomy changes. Cardiomegaly with vascular congestion. Diffuse bilateral interstitial and ground-glass opacity, possible edema. More  confluent opacity at the right base may reflect superimposed pneumonia. No pneumothorax. IMPRESSION: 1. Cardiomegaly with vascular congestion and diffuse bilateral interstitial and ground-glass opacity suggesting pulmonary edema. 2. More confluent airspace disease at the right base, could reflect superimposed pneumonia Electronically Signed   By: Donavan Foil M.D.   On: 05/08/2019 02:25    ASSESSMENT: 78 y.o. [name] woman with multifactorial anemia, as follows: (a) anemia of chronic inflammation: As of May 2020 she has a SED rate of 95, CRP 1.1, positive ANA and RF; she has been referred to rheumatology with an appointment pending 03/27/2019 inflammation makes iron stores relatively inaccessible for hematopoiesis and blunts the effect of native EPO (b) hemolysis (mild): she has a mildly elevated LDH and a DAT positive for complement, not IgG; this is c/w (a) above (c) anemia of renal insufficiency: inadequate EPO resonse to anemia(d) occult      (d) GIB/ iron deficiency in patient on lifelong anticoagulaton  (1) Feraheme received 03/01/2019, to be repeated 03/20/2019              (a) reticulocyte 03/07/2019 up from 43.4 a year ago to 191.8 after iron infusion  (2) darbepoetin: starting 05/05/2019 at 172mg given every 2 weeks.    (3) Admission for acute exacerbation of CHF, possible CAP, rash of unclear etiology  PLAN: [name] feeling better since coming to the hospital.  She received IV vancomycin and cefepime in the ER for possible community-acquired pneumonia.  She was also given a dose of doxycycline for possible tickborne illness.  Additional work-up is pending.  I am unclear if her rash is related to vasculitis versus tickborne illness versus medication reaction.  Her newest medication is Aranesp.  Package insert indicates a reported 7% right of skin rash as an adverse effect.  Additionally, she was recently on colchicine which was a  new medication for her.  She is unsure how long she was on this but this could also be a consideration.  In terms of her anemia, her hemoglobin improved after receiving 1 unit packed red blood cells.  Recommend daily CBC and transfuse for hemoglobin less than 7.0 or active bleeding.  Her last ferritin was slowly drifting down and was down to 118 on 05/05/2019.  She has received IV Feraheme in the past.  We will likely need to repeat this in the near future.  However, given her acute issues including rash, we will hold off on doing this at this time.    LOS: 0 days   KMikey Bussing DNP, AGPCNP-BC, AOCNP 05/08/19

## 2019-05-08 NOTE — ED Notes (Signed)
Breakfast tray ordered 

## 2019-05-08 NOTE — ED Notes (Signed)
Pt had bowel movement.

## 2019-05-08 NOTE — H&P (Signed)
History and Physical    [name] WPV:[id number] DOB: 1941-01-10 DOA: 05/07/2019  PCP: [name], MD Patient coming from: Home  Chief Complaint: Fever, chills, rash  HPI: LEEANNA [name] is a 78 y.o. female with medical history significant of atrial fibrillation, AVR with mechanical aortic valve on Coumadin, chronic diastolic congestive heart failure, CAD status post CABG, type 2 diabetes, hypertension, hyperlipidemia, PAD, hemolytic anemia, pulmonary hypertension, CKD presenting to the hospital for evaluation of fever, chills, and a rash.  Patient states she had transfusion of a medicine that helps build blood 3 days ago.  Then 2 days ago she started noticing a rash on her palms and legs.  Her rash is mildly pruritic.  Yesterday she was having chills and her friend checked her temperature and told her it was 101.8 F.  Denies any headaches or neck stiffness.  Denies any history of tick bites.  States she rarely goes outdoors, at most sits on her front porch.  She does not have any pet animals.  Denies any shortness of breath, cough, or chest pain.  States her legs are chronically swollen, no recent change.  Does report being started on allopurinol for gout 2 weeks ago and this is the only new medicine she is taking.  Denies noticing any blisters in her mouth.  States she was told her kidney function has been declining over time.  ED Course: Afebrile.  Slightly tachypneic.  Not tachycardic.  Blood pressure elevated.  Not hypoxic.  White count 3.0, chronically low.  Lactic acid normal.  Hemoglobin 7.5, baseline in the 7-8 range.  Platelet count 126,000.  Corrected sodium 129, chronically low.  Bicarb 16, previously low as well.  Anion gap 12.  BUN 59.  Creatinine 2.0, was 1.1 in May and has been trending up since then.  UA and urine culture pending.  INR 3.1.  ESR and CRP elevated.  LFTs normal.  BNP 254.  Blood culture x2 pending.  COVID-19 rapid test pending.  RMSF antibody IgM/IgG pending.   Lyme disease PCR pending.  Chest x-ray showing cardiomegaly with vascular congestion and diffuse bilateral interstitial and groundglass opacity suggesting pulmonary edema.  Also showing confluent airspace disease at the right lung base which could reflect superimposed pneumonia.  Patient received vancomycin, cefepime, doxycycline, and 200 cc IV fluid in the ED.  Review of Systems:  All systems reviewed and apart from history of presenting illness, are negative.  Past Medical History:  Diagnosis Date   Aortic atherosclerosis ([place]) 01/23/2018   Atrial fibrillation (HCC)    Carotid artery disease ([place])    Cholelithiasis 01/23/2018   Chronic anticoagulation    Coronary artery disease    status post coronary artery bypass grafting times 07/10/2004   Diabetes mellitus    Hypercholesteremia    Hypertension    Mechanical heart valve present    H. aortic valve replacement at the time of bypass surgery October 2005   Moderate to severe pulmonary hypertension ([place])    Peripheral arterial disease ([place])    history of left common iliac artery PTA and stenting for a chronic total occlusion 08/26/01    Past Surgical History:  Procedure Laterality Date   AORTIC VALVE REPLACEMENT     CARDIAC CATHETERIZATION  11/10/2004   40% right common illiac, 70% in stent restenosis of distal left common illiac,    CARDIAC CATHETERIZATION  05/18/2004   LAD 50-70% midstenosis, RCA dominant w/50% stenosis, 50% Right common Illiac artery ostial stenosis, 90% in  stent restenosis within midportion of left common illiac stent   Carotid Duplex  03/12/2012   RSA-elev. velocities suggestive of a 50-69% diameter reduction, Right&Left Bulb/Prox ICA-mild-mod.fibrous plaqueelevating Velocities abnormal study.   CHOLECYSTECTOMY N/A 03/01/2018   Procedure: LAPAROSCOPIC CHOLECYSTECTOMY;  Surgeon: Judeth Horn, MD;  Location: [facility];  Service: General;  Laterality: N/A;   COLONOSCOPY WITH PROPOFOL N/A 01/22/2018    Procedure: COLONOSCOPY WITH PROPOFOL;  Surgeon: Wilford Corner, MD;  Location: [facility];  Service: Endoscopy;  Laterality: N/A;   ESOPHAGOGASTRODUODENOSCOPY (EGD) WITH PROPOFOL N/A 01/22/2018   Procedure: ESOPHAGOGASTRODUODENOSCOPY (EGD) WITH PROPOFOL;  Surgeon: Wilford Corner, MD;  Location: [facility];  Service: Endoscopy;  Laterality: N/A;   ESOPHAGOGASTRODUODENOSCOPY (EGD) WITH PROPOFOL N/A 11/21/2018   Procedure: ESOPHAGOGASTRODUODENOSCOPY (EGD) WITH PROPOFOL;  Surgeon: [name], MD;  Location: [facility];  Service: Gastroenterology;  Laterality: N/A;   ESOPHAGOGASTRODUODENOSCOPY (EGD) WITH PROPOFOL Left 02/27/2019   Procedure: ESOPHAGOGASTRODUODENOSCOPY (EGD) WITH PROPOFOL;  Surgeon: Arta Silence, MD;  Location[facility];  Service: Endoscopy;  Laterality: Left;   GIVENS CAPSULE STUDY N/A 11/21/2018   Procedure: GIVENS CAPSULE STUDY;  Surgeon: [name], MD;  Location: [facility];  Service: Gastroenterology;  Laterality: N/A;  To be deployed during EGD   Lower Ext. Duplex  03/12/2012   Right Proximal CIA- vessel narrowing w/elevated velocities 0-49% diameter reduction. Right SFA-mild mixed density plaque throughout vessel.   NM MYOCAR PERF WALL MOTION  05/19/2010   protocol: Persantine, post stress EF 65%, negative for ischemia, low risk scan   RIGHT HEART CATH N/A 06/27/2018   Procedure: RIGHT HEART CATH;  Surgeon: [name], MD;  Location: [facility];  Service: Cardiovascular;  Laterality: N/A;   TRANSTHORACIC ECHOCARDIOGRAM  08/29/2012   Moderately calcified annulus of mitral valve, moderate regurg. of both mitral valve and tricuspid valve.      reports that she has quit smoking. Her smoking use included cigarettes. She has a 30.00 pack-year smoking history. She has never used smokeless tobacco. She reports that she does not drink alcohol or use drugs.  Allergies  Allergen Reactions   Flagyl [Metronidazole] Rash    ALL-OVER BODY RASH    Coreg [Carvedilol] Other (See Comments)    Terrible cramping in the feet and had a lot of bowel movements, but not diarrhea   Losartan Swelling    Patient doesn't recall site of swelling   Verapamil Hives   Zetia [Ezetimibe] Other (See Comments)    Reaction not recalled   Zocor [Simvastatin - High Dose] Other (See Comments)    Reaction not recalled    Family History  Problem Relation Age of Onset   Breast cancer Neg Hx     Prior to Admission medications   Medication Sig Start Date End Date Taking? Authorizing Provider  acetaminophen (TYLENOL) 325 MG tablet Take 325 mg by mouth every 6 (six) hours as needed.    [provider]  allopurinol (ZYLOPRIM) 100 MG tablet Take 100 mg by mouth daily.    [provider]  ALPRAZolam Duanne Moron) 0.5 MG tablet Take 0.25 mg by mouth at bedtime.     [provider]  atorvastatin (LIPITOR) 80 MG tablet Take 1 tablet (80 mg total) by mouth daily at 6 PM. 11/22/18   [name], MD  Cholecalciferol (VITAMIN D3) 1000 UNITS CAPS Take 1,000 Units by mouth daily with lunch.     [provider]  Cyanocobalamin (VITAMIN B12) 1000 MCG TBCR Take 1,000 mcg by mouth daily with lunch.  [provider]  feeding supplement, GLUCERNA SHAKE, (GLUCERNA SHAKE) LIQD Take 237 mLs by mouth 2 (two) times daily between meals. 04/07/19   [name], MD  ferrous sulfate 325 (65 FE) MG tablet Take 1 tablet (325 mg total) by mouth 2 (two) times daily with a meal. 04/07/19   [name], MD  furosemide (LASIX) 40 MG tablet Take 80 mg by mouth daily.    [provider]  hydrALAZINE (APRESOLINE) 100 MG tablet TAKE 1 TABLET (100 MG TOTAL) BY MOUTH 3 (THREE) TIMES DAILY. 01/20/19   [name], MD  magnesium oxide (MAG-OX) 400 (241.3 Mg) MG tablet Take 1 tablet (400 mg total) by mouth daily. 01/30/18   [name], MD  metFORMIN (GLUCOPHAGE) 500 MG tablet Take 500 mg by mouth 2 (two) times daily with a meal.      [provider]  metoprolol tartrate (LOPRESSOR) 25 MG tablet Take 0.5 tablets (12.5 mg total) by mouth 2 (two) times daily. 03/05/19   [place] Eric J, DO  ondansetron (ZOFRAN ODT) 4 MG disintegrating tablet Take 1 tablet (4 mg total) by mouth every 8 (eight) hours as needed for nausea or vomiting. 03/09/19   [name], Kaitlyn E, PA-C  pantoprazole (PROTONIX) 40 MG tablet Take 40 mg by mouth 2 (two) times daily.  12/05/14   [provider]  Probiotic Product (PROBIOTIC PO) Take 1 tablet by mouth daily.    [provider]  spironolactone (ALDACTONE) 25 MG tablet Take 25 mg by mouth daily.    [provider]  tadalafil (CIALIS) 20 MG tablet Take 1 tablet (20 mg total) by mouth 2 (two) times daily. 05/07/19   [name], MD  vitamin C (ASCORBIC ACID) 500 MG tablet Take 500 mg by mouth daily with lunch.    [provider]  warfarin (COUMADIN) 5 MG tablet Take 1.5 tablets on Tues, Thursday, Saturday and Sunday. Take 1 tablet on Mondays, Wednesdays, and Fridays. 04/26/19   [provider]    Physical Exam: Vitals:   05/08/19 0500 05/08/19 0515 05/08/19 0530 05/08/19 0545  BP: (!) 149/46 (!) 147/80 (!) 161/51 (!) 141/74  Pulse: (!) 57 73 60 62  Resp: (!) 23 18 (!) 22 15  Temp:      TempSrc:      SpO2: 94% 95% 93% 96%    Physical Exam  Constitutional: She is oriented to person, place, and time. No distress.  HENT:  Head: Normocephalic.  Mouth/Throat: Oropharynx is clear and moist.  No oral blisters  Eyes: Pupils are equal, round, and reactive to light. EOM are normal. Right eye exhibits no discharge. Left eye exhibits no discharge.  Neck: Neck supple.  No nuchal rigidity  Cardiovascular: Normal rate, regular rhythm and intact distal pulses.  Murmur heard. Pulmonary/Chest: Effort normal. No respiratory distress. She has no wheezes. She has rales.  Bibasilar rales  Abdominal: Soft. Bowel sounds are normal. She exhibits no distension.  There is no abdominal tenderness. There is no guarding.  Musculoskeletal:        General: Edema present.     Comments: +2 pitting edema of bilateral lower extremities  Neurological: She is alert and oriented to person, place, and time. No cranial nerve deficit.  Strength 5 out of 5 in bilateral upper and lower extremities.  Sensation to light touch intact throughout.  Skin: Skin is warm and dry. Rash noted. She is not diaphoretic.  Urticarial rash noted on the back Palms are erythematous with a  petechial rash Nonblanching petechia and purpura noted on bilateral lower extremities up to upper thigh level           Labs on Admission: I have personally reviewed following labs and imaging studies  CBC: Recent Labs  Lab 05/05/19 1109 05/07/19 2311  WBC 2.7* 3.0*  NEUTROABS 2.0  --   HGB 6.5* 7.5*  HCT 20.6* 23.3*  MCV 93.2 92.5  PLT 147* 604*   Basic Metabolic Panel: Recent Labs  Lab 05/05/19 1109 05/07/19 2311  NA 131* 128*  K 4.2 4.3  CL 104 100  CO2 19* 16*  GLUCOSE 194* 175*  BUN 57* 59*  CREATININE 1.99* 2.06*  CALCIUM 8.4* 8.8*   GFR: Estimated Creatinine Clearance: 17.2 mL/min (A) (by C-G formula based on SCr of 2.06 mg/dL (H)). Liver Function Tests: Recent Labs  Lab 05/05/19 1109 05/08/19 0218  AST 20 26  ALT 10 14  ALKPHOS 83 73  BILITOT 0.5 1.1  PROT 6.8 6.7  ALBUMIN 2.8* 2.8*   No results for input(s): LIPASE, AMYLASE in the last 168 hours. No results for input(s): AMMONIA in the last 168 hours. Coagulation Profile: Recent Labs  Lab 05/01/19 1127 05/08/19 0218  INR 2.6 3.1*   Cardiac Enzymes: No results for input(s): CKTOTAL, CKMB, CKMBINDEX, TROPONINI in the last 168 hours. BNP (last 3 results) No results for input(s): PROBNP in the last 8760 hours. HbA1C: No results for input(s): HGBA1C in the last 72 hours. CBG: No results for input(s): GLUCAP in the last 168 hours. Lipid Profile: No results for input(s): CHOL, HDL, LDLCALC, TRIG,  CHOLHDL, LDLDIRECT in the last 72 hours. Thyroid Function Tests: No results for input(s): TSH, T4TOTAL, FREET4, T3FREE, THYROIDAB in the last 72 hours. Anemia Panel: Recent Labs    05/05/19 1109 05/05/19 1110  FERRITIN 118  --   TIBC 251  --   IRON 37*  --   RETICCTPCT  --  2.9   Urine analysis:    Component Value Date/Time   COLORURINE YELLOW 03/09/2019 2119   APPEARANCEUR CLEAR 03/09/2019 2119   LABSPEC 1.006 03/09/2019 2119   PHURINE 7.0 03/09/2019 2119   GLUCOSEU NEGATIVE 03/09/2019 2119   HGBUR MODERATE (A) 03/09/2019 2119   BILIRUBINUR NEGATIVE 03/09/2019 2119   [place] 03/09/2019 2119   PROTEINUR 30 (A) 03/09/2019 2119   UROBILINOGEN 1.0 10/06/2011 1709   NITRITE NEGATIVE 03/09/2019 2119   LEUKOCYTESUR NEGATIVE 03/09/2019 2119    Radiological Exams on Admission: Dg Chest Portable 1 View  Result Date: 05/08/2019 CLINICAL DATA:  Fever EXAM: PORTABLE CHEST 1 VIEW COMPARISON:  04/04/2019, 02/28/2019 FINDINGS: Post sternotomy changes. Cardiomegaly with vascular congestion. Diffuse bilateral interstitial and ground-glass opacity, possible edema. More confluent opacity at the right base may reflect superimposed pneumonia. No pneumothorax. IMPRESSION: 1. Cardiomegaly with vascular congestion and diffuse bilateral interstitial and ground-glass opacity suggesting pulmonary edema. 2. More confluent airspace disease at the right base, could reflect superimposed pneumonia Electronically Signed   By: Donavan Foil M.D.   On: 05/08/2019 02:25    EKG: Independently reviewed.  Sinus rhythm, first-degree AV block.  No significant change since prior tracing.  Assessment/Plan Principal Problem:   Rash Active Problems:   Anemia   Acute on chronic diastolic heart failure (HCC)   Diabetes mellitus type 2, controlled (HCC)   Renal insufficiency  Fever and rash Patient with urticaria rash to her back, erythema and petechial rash on palms, and petechia/purpura on lower  extremities.  Differential includes tickborne illness, medication reaction,  and vasculitis.  Febrile at home.  No leukocytosis.  ESR and CRP elevated.  ANA and rheumatoid factor previously positive and patient was referred to rheumatology. -Doxycycline for treatment of possible tickborne illness -Blood culture x2 pending -RMSF antibody IgM/IgG -Lyme disease PCR -UA with urine sediment -Hepatitis B and C serologies -Serum complement levels -Serum cryoglobulins -ANCA -HIV antibody -Patient will need a skin biopsy.  Acute exacerbation of chronic diastolic congestive heart failure Currently breathing comfortably on room air.  Not hypoxic.  Has bilateral lower extremity edema on exam.  BNP 254.  Chest x-ray showing cardiomegaly with vascular congestion and diffuse bilateral interstitial and groundglass opacity suggesting pulmonary edema.  Echo done yesterday showing LVEF 60 to 78% and diastolic Doppler parameters consistent with pseudo-normalization.  Trivial mitral regurgitation and no significant aortic regurgitation. -IV Lasix 20 mg once.  Reassess renal function before ordering additional doses given renal insufficiency. -Monitor intake and output -Daily weights  Renal insufficiency BUN 59.  Creatinine 2.0, was 1.1 in May and has been trending up since then. -Giving one-time dose of IV Lasix 20 mg for pulmonary edema.  Monitor renal function closely.  ?CAP Chest x-ray showing confluent airspace disease at the right lung base.  Febrile at home in the setting of new skin rash.  No leukocytosis.  Lactic acid normal. -Received vancomycin and cefepime in the ED.  Check procalcitonin level.  Anemia and thrombocytopenia Hemoglobin 7.5, baseline in the 7-8 range.  Platelet count 126,000.  Followed by hematology/oncology.  Last office visit 7/27.  Her anemia is thought to be multifactorial-from chronic inflammation, renal insufficiency, mild hemolysis, and GI bleed/iron deficiency.  She received  Feraheme on 5/23 and darbepoetin on 7/27.  Mechanical aortic valve INR therapeutic at 3.1. -Continue Coumadin per pharmacy  Non-anion gap metabolic acidosis Likely related to renal insufficiency.  Bicarb 16, previously low as well.  Anion gap 12.   -Continue to monitor  Hypertension Systolic currently in 295A. -Lasix as above -Hydralazine PRN  Type 2 diabetes -Sliding scale insulin and CBG checks.  Unable to safely order home medications at this time as pharmacy medication reconciliation is pending.  DVT prophylaxis: Coumadin Code Status: Patient wishes to be full code. Family Communication: No family available at this time. Disposition Plan: Anticipate discharge after clinical improvement. Consults called: None Admission status: It is my clinical opinion that admission to [facility] is reasonable and necessary in this 78 y.o. female  presenting with symptoms of fever and rash, concerning for possible tickborne illness versus medication reaction versus vasculitis.  Also has pulmonary edema secondary to acutely decompensated CHF.  Anticipate patient will be in the hospital for the next few days for work-up of her fever and rash.   Given the aforementioned, the predictability of an adverse outcome is felt to be significant. I expect that the patient will require at least 2 midnights in the hospital to treat this condition.   The medical decision making on this patient was of high complexity and the patient is at high risk for clinical deterioration, therefore this is a level 3 visit.  Shela Leff MD [facility] Pager [phone]  If 7PM-7AM, please contact night-coverage [email or web address] Password [email or web address]  05/08/2019, 7:01 AM

## 2019-05-08 NOTE — Progress Notes (Signed)
Date:  05/08/2019   ID:  [name], DOB July 26, 1941, MRN [id number]    Provider location: [facility] Type of Visit: Established patient   PCP:  [name], MD  Cardiologist:  Quay Burow, MD Primary HF: Dr. Aundra Dubin  Chief Complaint: Shortness of breath   History of Present Illness: [name] is a 78 y.o. female who has a history of PAD, CAD s/p CABG, mechanical AVR, pulmonary hypertension, and chronic diastolic CHF with prominent RV dysfunction. Patient had CABG-AVR in 10/05.  She has been in chronic atrial fibrillation, it appears, since at least 4/17.  She has significant PAD followed by Dr. [name].    She was admitted in 5/19 with dyspnea and marked volume overloaded. She was started on Lasix and it was titrated up.  Echo showed EF 55-60% with moderate RV dilation, severe RV systolic dysfunction, and PASP 88 mmHg.  [place] in 9/19 showed moderate pulmonary hypertension with near normal filling pressures. PVR was not particularly high at 3.1 WU primarily due to elevated cardiac output. V/Q scan did not show chronic PEs and PFTs were unremarkable. She is on Adcirca.   She was admitted in 2/20 with upper GI bleeding, found to have gastric ulcers.  She had a CT chest in 3/20 with subtle bilateral ground glass opacities.  Referred to pulmonary.   She was admitted again in 6/20 with a GI bleed and required transfusion.   She was seen by pulmonary in 7/20, PFTs showed no obstruction/restriction but decreased DLCO.    She was seen by rheumatology due to concern for rheumatoid arthritis.  She was thought to have gout and OA but no RA.   Echo was done today and reviewed.  EF 60-65% with mild LVH, normal RV, mechanical aortic valve with mean gradient 17 mmHg, PASP 34 mmHg.   She returns for followup of diastolic CHF and CAD.  Weight is down 10 lbs.  She is weak, says she has poor appetite.  She uses a walker, no dyspnea walking for short distances on  flat ground.  No orthopnea/PND.  No chest pain.  No BRBPR/melena.    ECG (personally reviewed): NSR, 1st degree AVB, right axis deviation  Labs (8/19): K 4.2, creatinine 0.8, LDL 56 Labs (9/19): K 4.3, Na 129, creatinine 0.87, hgb 9.8 Labs (10/19): ANA+, RF 36.8 (elevated), SCL-70 negative, anti-centromere Ab negative, ds-DNA negative.  Labs (12/19): K 4, creatinine 1.09 Labs (1/20): CCP Ab elevated 115 Labs (2/20): K 4.1, creatinine 1.24 Labs (7/20): K 4.2, creatinine 1.99, hgb 6.5 (transfused)  6 min walk (10/19): 189 m 6 min walk (1/20): 56 m  PMH: 1. Mechanical [employer] aortic valve 10/05: Goal INR 2.5-3.5.  2. CAD: s/p CABG x 2 with AVR in 10/05.  - Cardiolite in 2014 showed no ischemia.  3. HTN 4. Hyperlipidemia 5. Type 2 diabetes 6. PAD: Peripheral arterial dopplers (7/19) with >50% CIA stenosis on right, 70-99% CIA stenosis on the left.  7. H/o CCY 8. Atrial fibrillation: Chronic since at least 4/17. 9. Chronic diastolic CHF with prominent RV dysfunction: Echo (5/19) with EF 40-98%, grade 3 diastolic dysfunction, mechanical aortic valve with mean gradient 10 mmHg, mild MR, moderate RV dilation with moderate-severely decreased RV systolic function, biatrial enlargement, PASP 88 mmHg.  - Echo (7/20): EF 60-65% with mild LVH, normal RV, mechanical aortic valve with mean gradient 17 mmHg, PASP 34 mmHg.  10. Carotid stenosis: 40-59% BICA stenosis on 7/19 carotid  dopplers.  11. Pulmonary hypertension: RHC (9/19) with mean RA 5, PA 63/17 mean 36, mean PCWP 16, CI 4.16, PVR 3.1 WU.  - V/Q scan (10/19): no evidence for chronic PE.  - PFTs (10/19): minimal obstruction.  - ANA+, RF 36.8 (elevated), SCL-70 negative, anti-centromere Ab negative, ds-DNA negative, CCP Ab positive. She saw rheumatology, NOT thought to have RA.  - Echo in 7/20 with normal-appearing RV and PASP 34 mmHg.  - PFTs (7/20) with no significant obstruction/restriction but decreased DLCO.  12. Gout 13. Hemolytic  anemia 14. History of GI bleeding.   No current facility-administered medications for this encounter.    No current outpatient medications on file.   Facility-Administered Medications Ordered in Other Encounters  Medication Dose Route Frequency Provider Last Rate Last Dose  . acetaminophen (TYLENOL) tablet 650 mg  650 mg Oral Q6H PRN Shela Leff, MD   650 mg at 05/08/19 1733   Or  . acetaminophen (TYLENOL) suppository 650 mg  650 mg Rectal Q6H PRN Shela Leff, MD      . acidophilus (RISAQUAD) capsule 1 capsule  1 capsule Oral Daily Little Ishikawa, MD   1 capsule at 05/08/19 1905  . allopurinol (ZYLOPRIM) tablet 100 mg  100 mg Oral Daily Little Ishikawa, MD   100 mg at 05/08/19 1906  . ALPRAZolam Duanne Moron) tablet 0.25 mg  0.25 mg Oral QHS Little Ishikawa, MD      . atorvastatin (LIPITOR) tablet 80 mg  80 mg Oral q1800 Little Ishikawa, MD   80 mg at 05/08/19 1905  . [START ON 05/09/2019] cholecalciferol (VITAMIN D3) tablet 1,000 Units  1,000 Units Oral Q lunch Little Ishikawa, MD      . Derrill Memo ON 05/09/2019] feeding supplement (GLUCERNA SHAKE) (GLUCERNA SHAKE) liquid 237 mL  237 mL Oral BID BM Little Ishikawa, MD      . ferrous sulfate tablet 325 mg  325 mg Oral BID WC Little Ishikawa, MD   325 mg at 05/08/19 1906  . furosemide (LASIX) tablet 80 mg  80 mg Oral Daily Little Ishikawa, MD   80 mg at 05/08/19 1905  . hydrALAZINE (APRESOLINE) injection 5 mg  5 mg Intravenous Q4H PRN Shela Leff, MD   5 mg at 05/08/19 1906  . hydrALAZINE (APRESOLINE) tablet 100 mg  100 mg Oral TID Little Ishikawa, MD   100 mg at 05/08/19 1536  . insulin aspart (novoLOG) injection 0-5 Units  0-5 Units Subcutaneous QHS Shela Leff, MD      . insulin aspart (novoLOG) injection 0-9 Units  0-9 Units Subcutaneous TID WC Shela Leff, MD      . magnesium oxide (MAG-OX) tablet 400 mg  400 mg Oral Daily Little Ishikawa, MD   400 mg at 05/08/19  1904  . metoprolol tartrate (LOPRESSOR) tablet 12.5 mg  12.5 mg Oral BID Little Ishikawa, MD   12.5 mg at 05/08/19 1733  . ondansetron (ZOFRAN) injection 4 mg  4 mg Intravenous Q6H PRN Bodenheimer, Charles A, NP   4 mg at 05/08/19 2005  . pantoprazole (PROTONIX) EC tablet 40 mg  40 mg Oral BID Little Ishikawa, MD      . spironolactone (ALDACTONE) tablet 25 mg  25 mg Oral Daily Little Ishikawa, MD   25 mg at 05/08/19 1741  . tadalafil (CIALIS) tablet 20 mg  20 mg Oral BID Little Ishikawa, MD      . Derrill Memo ON 05/09/2019] vitamin B-12 (  CYANOCOBALAMIN) tablet 1,000 mcg  1,000 mcg Oral Q lunch Little Ishikawa, MD      . Derrill Memo ON 05/09/2019] vitamin C (ASCORBIC ACID) tablet 500 mg  500 mg Oral Q lunch Little Ishikawa, MD      . Warfarin - Pharmacist Dosing Inpatient   Does not apply q1800 Shela Leff, MD        Allergies:   Flagyl [metronidazole], Coreg [carvedilol], Losartan, Verapamil, Zetia [ezetimibe], and Zocor [simvastatin - high dose]   Social History:  The patient  reports that she has quit smoking. Her smoking use included cigarettes. She has a 30.00 pack-year smoking history. She has never used smokeless tobacco. She reports that she does not drink alcohol or use drugs.   Family History:  [name] family history is not on file.   ROS:  Please see the history of present illness.   All other systems are personally reviewed and negative.   Exam:   BP (!) 150/50   Pulse 61   Wt 56.3 kg (124 lb 3.2 oz)   SpO2 98%   BMI 22.72 kg/m  General: NAD Neck: No JVD, no thyromegaly or thyroid nodule.  Lungs: Clear to auscultation bilaterally with normal respiratory effort. CV: Nondisplaced PMI.  Heart regular S1/S2 mechanical S2, no S3/S4, no murmur.  1+ ankle edema.  No carotid bruit.  Normal pedal pulses.  Abdomen: Soft, nontender, no hepatosplenomegaly, mild distention.  Skin: Intact without lesions or rashes.  Neurologic: Alert and oriented x 3.   Psych: Normal affect. Extremities: No clubbing or cyanosis.  HEENT: Normal.     Recent Labs: 04/07/2019: Magnesium 1.6 05/07/2019: Hemoglobin 7.5; Platelets 126 05/08/2019: ALT 14; B Natriuretic Peptide 254.0; BUN 56; Creatinine, Ser 1.84; Potassium 3.8; Sodium 132  Personally reviewed   Wt Readings from Last 3 Encounters:  05/08/19 54.6 kg (120 lb 6.4 oz)  05/07/19 56.3 kg (124 lb 3.2 oz)  05/05/19 56 kg (123 lb 8 oz)     ASSESSMENT AND PLAN:  1. Atrial fibrillation: Chronic.  She has been on warfarin with mechanical aortic valve.  She has been in atrial fibrillation as far back as 4/17. Given the chronicity, I think that she is unlikely to successfully hold NSR long-term.   - Can continue low dose metoprolol, HR controlled.  Would hold off on anti-arrhythmic meds.   - Continue warfarin, goal INR 2.5-3.5. No ASA with history of GI bleeding.  - With age, diastolic CHF, LVH and renal dysfunction, think it is reasonable to assess for cardiac amyloidosis.  I will arrange for PYP scan and send myeloma panel, urine immunofixation, and serum free light chains.  2. Chronic diastolic CHF: With prominent RV dysfunction and severe pulmonary hypertension by echo in 2019. Filling pressures were well-compensated on 9/19 RHC. Repeat echo in 7/20 showed normal RV size and systolic function and PA systolic pressure down to 34 mmHg.  Weight lower and symptoms stable, NYHA class II-III. She is not volume overloaded on exam.  - Continue Lasix 80 mg daily, BMET 2 wks.  3. CAD: No chest pain.   - Continue atorvastatin and warfarin.  - Will need to check lipids.  4. PAD: Significant on 7/19 but no claudication, no pedal ulcerations. She will need repeat peripheral arterial dopplers ordered.  5. Mechanical aortic valve: Stable on 5/19 echo.  Given co-existing atrial fibrillation, goal INR is 2.5-3.5.  - Continue warfarin.  - No ASA with GI bleeding.  6. Pulmonary hypertension: Moderate pulmonary hypertension  on 9/19 with RV dysfunction and dilation noted on last echo. PVR only 3.1 WU but cardiac output was high.  V/Q scan in 10/19 with no evidence for chronic PEs and PFTs in 10/19 and again in 7/20 were unremarkable except for low DLCO.  I think there is a possible group 1 PH component though this is a borderline call as PVR is not significantly elevated. ANA and RF positive, as is CCP Ab.  She saw rheumatology and per her report, was not thought to have rheumatoid arthritis.  CT chest showed subtle bilateral ground glass opacities in 3/20, she is followed by pulmonary.   - Continue Adcirca 40 mg daily.  It was borderline to start this, and I am not sure she has had marked benefit.  Will hold off on starting a 2nd [place] medication for now.  Repeat echo in 7/20 showed normal RV and estimated PA systolic pressure 34 mmHg.  7. Rheumatology: Joint pain, positive RF and CCP Ab.  Lung findings (ground glass, pulmonary hypertension) also thought to be concerning for RA. However, per patient's report, she was told she did not have RA and did have gout..    Followup in 3 months.     Signed, Loralie Champagne, MD  05/08/2019   [facility] [address] [facility] [place] [address] [phone] (office) [phone] (fax)

## 2019-05-08 NOTE — Progress Notes (Signed)
ANTICOAGULATION CONSULT NOTE - Initial Consult  Pharmacy Consult for Coumadin Indication: AVR  Allergies  Allergen Reactions  . Flagyl [Metronidazole] Rash    ALL-OVER BODY RASH  . Coreg [Carvedilol] Other (See Comments)    Terrible cramping in the feet and had a lot of bowel movements, but not diarrhea  . Losartan Swelling    Patient doesn't recall site of swelling  . Verapamil Hives  . Zetia [Ezetimibe] Other (See Comments)    Reaction not recalled  . Zocor [Simvastatin - High Dose] Other (See Comments)    Reaction not recalled    Patient Measurements:    Vital Signs: Temp: 98.4 F (36.9 C) (07/30 0057) Temp Source: Oral (07/30 0057) BP: 151/46 (07/30 0430) Pulse Rate: 66 (07/30 0430)  Labs: Recent Labs    05/05/19 1109 05/07/19 2311 05/08/19 0218  HGB 6.5* 7.5*  --   HCT 20.6* 23.3*  --   PLT 147* 126*  --   LABPROT  --   --  31.3*  INR  --   --  3.1*  CREATININE 1.99* 2.06*  --     Estimated Creatinine Clearance: 17.2 mL/min (A) (by C-G formula based on SCr of 2.06 mg/dL (H)).   Medical History: Past Medical History:  Diagnosis Date  . Aortic atherosclerosis ([place]) 01/23/2018  . Atrial fibrillation ([place])   . Carotid artery disease ([place])   . Cholelithiasis 01/23/2018  . Chronic anticoagulation   . Coronary artery disease    status post coronary artery bypass grafting times 07/10/2004  . Diabetes mellitus   . Hypercholesteremia   . Hypertension   . Mechanical heart valve present    H. aortic valve replacement at the time of bypass surgery October 2005  . Moderate to severe pulmonary hypertension ([place])   . Peripheral arterial disease ([facility])    history of left common iliac artery PTA and stenting for a chronic total occlusion 08/26/01    Medications:  No current facility-administered medications on file prior to encounter.    Current Outpatient Medications on File Prior to Encounter  Medication Sig Dispense Refill  . acetaminophen (TYLENOL) 325 MG  tablet Take 325 mg by mouth every 6 (six) hours as needed.    [name] allopurinol (ZYLOPRIM) 100 MG tablet Take 100 mg by mouth daily.    [name] ALPRAZolam (XANAX) 0.5 MG tablet Take 0.25 mg by mouth at bedtime.     [name] atorvastatin (LIPITOR) 80 MG tablet Take 1 tablet (80 mg total) by mouth daily at 6 PM.    . Cholecalciferol (VITAMIN D3) 1000 UNITS CAPS Take 1,000 Units by mouth daily with lunch.     . Cyanocobalamin (VITAMIN B12) 1000 MCG TBCR Take 1,000 mcg by mouth daily with lunch.     . feeding supplement, GLUCERNA SHAKE, (GLUCERNA SHAKE) LIQD Take 237 mLs by mouth 2 (two) times daily between meals. 237 mL 30  . ferrous sulfate 325 (65 FE) MG tablet Take 1 tablet (325 mg total) by mouth 2 (two) times daily with a meal. 60 tablet 3  . furosemide (LASIX) 40 MG tablet Take 80 mg by mouth daily.    . hydrALAZINE (APRESOLINE) 100 MG tablet TAKE 1 TABLET (100 MG TOTAL) BY MOUTH 3 (THREE) TIMES DAILY. 270 tablet 1  . magnesium oxide (MAG-OX) 400 (241.3 Mg) MG tablet Take 1 tablet (400 mg total) by mouth daily.    . metFORMIN (GLUCOPHAGE) 500 MG tablet Take 500 mg by mouth 2 (two) times daily with a meal.     .  metoprolol tartrate (LOPRESSOR) 25 MG tablet Take 0.5 tablets (12.5 mg total) by mouth 2 (two) times daily. 60 tablet 0  . ondansetron (ZOFRAN ODT) 4 MG disintegrating tablet Take 1 tablet (4 mg total) by mouth every 8 (eight) hours as needed for nausea or vomiting. 6 tablet 0  . pantoprazole (PROTONIX) 40 MG tablet Take 40 mg by mouth 2 (two) times daily.     . Probiotic Product (PROBIOTIC PO) Take 1 tablet by mouth daily.    [name] spironolactone (ALDACTONE) 25 MG tablet Take 25 mg by mouth daily.    . tadalafil (CIALIS) 20 MG tablet Take 1 tablet (20 mg total) by mouth 2 (two) times daily. 180 tablet 3  . vitamin C (ASCORBIC ACID) 500 MG tablet Take 500 mg by mouth daily with lunch.    . warfarin (COUMADIN) 5 MG tablet Take 1.5 tablets on Tues, Thursday, Saturday and Sunday. Take 1 tablet on Mondays,  Wednesdays, and Fridays.       Assessment: 78 y.o. female admitted with rash, h/o mechanical aortic valve, for Coumadin  Goal of Therapy:  INR 2-3 Monitor platelets by anticoagulation protocol: Yes   Plan:  Coumadin 2.5 mg today.   F/U daily INR  [name] Knack, PharmD, BCPS    Jolena Kittle, [place] 05/08/2019,6:52 AM

## 2019-05-09 DIAGNOSIS — D692 Other nonthrombocytopenic purpura: Secondary | ICD-10-CM | POA: Diagnosis not present

## 2019-05-09 DIAGNOSIS — R21 Rash and other nonspecific skin eruption: Secondary | ICD-10-CM | POA: Diagnosis not present

## 2019-05-09 LAB — BASIC METABOLIC PANEL
Anion gap: 9 (ref 5–15)
BUN: 51 mg/dL — ABNORMAL HIGH (ref 8–23)
CO2: 20 mmol/L — ABNORMAL LOW (ref 22–32)
Calcium: 8.6 mg/dL — ABNORMAL LOW (ref 8.9–10.3)
Chloride: 103 mmol/L (ref 98–111)
Creatinine, Ser: 1.77 mg/dL — ABNORMAL HIGH (ref 0.44–1.00)
GFR calc Af Amer: 31 mL/min — ABNORMAL LOW (ref >60)
GFR calc non Af Amer: 27 mL/min — ABNORMAL LOW (ref >60)
Glucose, Bld: 98 mg/dL (ref 70–99)
Potassium: 3.8 mmol/L (ref 3.5–5.1)
Sodium: 132 mmol/L — ABNORMAL LOW (ref 135–145)

## 2019-05-09 LAB — GLUCOSE, CAPILLARY
Glucose-Capillary: 90 mg/dL (ref 70–99)
Glucose-Capillary: 182 mg/dL — ABNORMAL HIGH (ref 70–99)

## 2019-05-09 LAB — MULTIPLE MYELOMA PANEL, SERUM
Albumin SerPl Elph-Mcnc: 3.2 g/dL (ref 2.9–4.4)
Albumin/Glob SerPl: 0.9 (ref 0.7–1.7)
Alpha 1: 0.4 g/dL (ref 0.0–0.4)
Alpha2 Glob SerPl Elph-Mcnc: 0.9 g/dL (ref 0.4–1.0)
B-Globulin SerPl Elph-Mcnc: 0.8 g/dL (ref 0.7–1.3)
Gamma Glob SerPl Elph-Mcnc: 1.7 g/dL (ref 0.4–1.8)
Globulin, Total: 3.7 g/dL (ref 2.2–3.9)
IgA: 153 mg/dL (ref 64–422)
IgG (Immunoglobin G), Serum: 1520 mg/dL (ref 586–1602)
IgM (Immunoglobulin M), Srm: 461 mg/dL — ABNORMAL HIGH (ref 26–217)
Total Protein ELP: 6.9 g/dL (ref 6.0–8.5)

## 2019-05-09 LAB — HEPATITIS C ANTIBODY: HCV Ab: 0.2 s/co ratio (ref 0.0–0.9)

## 2019-05-09 LAB — CBC
HCT: 24.4 % — ABNORMAL LOW (ref 36.0–46.0)
Hemoglobin: 8.1 g/dL — ABNORMAL LOW (ref 12.0–15.0)
MCH: 30 pg (ref 26.0–34.0)
MCHC: 33.2 g/dL (ref 30.0–36.0)
MCV: 90.4 fL (ref 80.0–100.0)
Platelets: 145 10*3/uL — ABNORMAL LOW (ref 150–400)
RBC: 2.7 MIL/uL — ABNORMAL LOW (ref 3.87–5.11)
RDW: 15 % (ref 11.5–15.5)
WBC: 3.2 10*3/uL — ABNORMAL LOW (ref 4.0–10.5)
nRBC: 0 % (ref 0.0–0.2)

## 2019-05-09 LAB — HEPATITIS B CORE ANTIBODY, TOTAL: Hep B Core Total Ab: NEGATIVE

## 2019-05-09 LAB — HEPATITIS B SURFACE ANTIBODY, QUANTITATIVE: Hep B S AB Quant (Post): <3.1 m[IU]/mL — ABNORMAL LOW (ref >9.9)

## 2019-05-09 LAB — PROTIME-INR
INR: 3.1 — ABNORMAL HIGH (ref 0.8–1.2)
Prothrombin Time: 31.6 seconds — ABNORMAL HIGH (ref 11.4–15.2)

## 2019-05-09 LAB — C3 COMPLEMENT: C3 Complement: 98 mg/dL (ref 82–167)

## 2019-05-09 LAB — C4 COMPLEMENT: Complement C4, Body Fluid: 21 mg/dL (ref 14–44)

## 2019-05-09 LAB — COMPLEMENT, TOTAL: Compl, Total (CH50): >60 U/mL (ref >41)

## 2019-05-09 LAB — ANTI-DNA ANTIBODY, DOUBLE-STRANDED: ds DNA Ab: 5 IU/mL (ref 0–9)

## 2019-05-09 LAB — HEPATITIS B SURFACE ANTIGEN: Hepatitis B Surface Ag: NEGATIVE

## 2019-05-09 LAB — HIV ANTIBODY (ROUTINE TESTING W REFLEX): HIV Screen 4th Generation wRfx: NONREACTIVE

## 2019-05-09 MED ORDER — WARFARIN SODIUM 2.5 MG PO TABS: 2.5000 mg | ORAL_TABLET | Freq: Once | ORAL | Status: DC

## 2019-05-09 MED ORDER — DIPHENHYDRAMINE-ZINC ACETATE 2-0.1 % EX CREA
TOPICAL_CREAM | Freq: Three times a day (TID) | CUTANEOUS | Status: DC | PRN
  Filled 2019-05-09: qty 28

## 2019-05-09 MED ORDER — DIPHENHYDRAMINE-ZINC ACETATE 2-0.1 % EX CREA: TOPICAL_CREAM | Freq: Three times a day (TID) | CUTANEOUS | 0 refills | Status: DC | PRN

## 2019-05-09 NOTE — Progress Notes (Signed)
[facility] [place] for Coumadin Indication: AVR  Patient Measurements: Height: 5\' 2"  (157.5 cm) Weight: 119 lb 3.2 oz (54.1 kg)(scale a) IBW/kg (Calculated) : 50.1  Vital Signs: Temp: 99.4 F (37.4 C) (07/31 0802) Temp Source: Oral (07/31 0802) BP: 155/53 (07/31 0802) Pulse Rate: 87 (07/31 0802)  Labs: Recent Labs    05/07/19 2311 05/08/19 0218 05/08/19 1224 05/09/19 0624  HGB 7.5*  --   --  8.1*  HCT 23.3*  --   --  24.4*  PLT 126*  --   --  145*  LABPROT  --  31.3*  --  31.6*  INR  --  3.1*  --  3.1*  CREATININE 2.06*  --  1.84* 1.77*    Estimated Creatinine Clearance: 20 mL/min (A) (by C-G formula based on SCr of 1.77 mg/dL (H)).  Assessment: 78 y.o. female admitted with rash. She is on chronic warfarin for history of a mechanical valve. INR remains slightly above goal despite reduced dose of warfarin last night. Pt did receive a 1x dose of doxycycline which may have influenced the INR. No bleeding noted.  PTA warfarin dose: 7.5mg  TTSS, 5mg  MWF  Goal of Therapy:  INR 2-3 Monitor platelets by anticoagulation protocol: Yes   Plan:  Repeat low dose warfarin 2.5mg  PO x 1 tonight Daily INR  [name], PharmD, BCPS Please see AMION for all pharmacy numbers 05/09/2019 8:56 AM

## 2019-05-09 NOTE — Consult Note (Addendum)
   [facility] CM Inpatient Consult   05/09/2019  [name] 09/03/1941 [id number]  1520:  Was able to speak with the patient about her return home via hospital phone. HIPAA verified.  She has concerns for food because her microwave is no longer functional.  Will notify [facility] about her need for Boost. Patient graciously desires ongoing follow up with [facility] CM.    0829: Patient is currently active with [facility] for chronic disease management services.  Patient has been engaged by a [facility] Coordinator which made this writer aware of patient's hospitalization.  Patient with [facility]. This patient is at extreme high risk for unplanned readmissions and has a less than 30 day readmission.  Score is 41% currently.  Chart review reveals as seen by MD notes 05/08/2019 as follows:   HPI: [name] is a 78 y.o. female with medical history significant of atrial fibrillation, AVR with mechanical aortic valve on Coumadin, chronic diastolic congestive heart failure, CAD status post CABG, type 2 diabetes, hypertension, hyperlipidemia, PAD, hemolytic anemia, pulmonary hypertension, CKD presenting to the hospital for evaluation of fever, chills, and a rash.  Patient states she had transfusion of a medicine that helps build blood 3 days ago.  Then 2 days ago she started noticing a rash on her palms and legs.  Her rash is mildly pruritic.  Yesterday she was having chills and her friend checked her temperature and told her it was 101.8 F.  Denies any headaches or neck stiffness.  Denies any history of tick bites.  States she rarely goes outdoors, at most sits on her front porch.  She does not have any pet animals.  Denies any shortness of breath, cough, or chest pain.  States her legs are chronically swollen, no recent change.  Does report being started on allopurinol for gout 2 weeks ago and this is the only new medicine she is taking.  Denies noticing any blisters in her mouth.   States she was told her kidney function has been declining over time.  Primary Care Provider: Domenick Gong, MD,   Plan: Patient will assessed for ongoing needs for care management.  Will follow for disposition and needs. Message sent to  Inpatient [facility] team member to make aware that [facility] following.   Of note, [facility] Care Management services does not replace or interfere with any services that are needed or arranged by inpatient [facility] care management team.  For additional questions or referrals please contact:

## 2019-05-09 NOTE — Progress Notes (Signed)
Patient discharged: Home   Via: Wheelchair   Discharge paperwork given: to patient and family  Reviewed with teach back  IV and telemetry disconnected  Belongings given to patient

## 2019-05-09 NOTE — Discharge Summary (Addendum)
Physician Discharge Summary  [name] QIW:[id number] DOB: 05-Oct-1941 DOA: 05/07/2019  PCP: [name], MD  Admit date: 05/07/2019 Discharge date: 05/09/2019  Admitted From: Home Disposition:  Home  Recommendations for Outpatient Follow-up:  1. Follow up with PCP in 1-2 weeks 2. Please obtain BMP/CBC in one week  Discharge Condition: Stable CODE STATUS: Full Diet recommendation: As tolerated  Brief/Interim Summary: [name] is a 78 y.o. female with medical history significant of atrial fibrillation, AVR with mechanical aortic valve on Coumadin, chronic diastolic congestive heart failure, CAD status post CABG, type 2 diabetes, hypertension, hyperlipidemia, PAD, hemolytic anemia, pulmonary hypertension, CKD presenting to the hospital for evaluation of fever, chills, and a rash.  Patient states she had transfusion of a medicine that helps build blood 3 days ago.  Then 2 days ago she started noticing a rash on her palms and legs.  Her rash is mildly pruritic.  Yesterday she was having chills and her friend checked her temperature and told her it was 101.8 F.  Denies any headaches or neck stiffness.  Denies any history of tick bites.  States she rarely goes outdoors, at most sits on her front porch.  She does not have any pet animals.  Denies any shortness of breath, cough, or chest pain.  States her legs are chronically swollen, no recent change.  Does report being started on allopurinol for gout 2 weeks ago and this is the only new medicine she is taking.  Denies noticing any blisters in her mouth.  States she was told her kidney function has been declining over time. In ED patient was noted to be afebrile.  Slightly tachypneic.  Not tachycardic.  Blood pressure elevated.  Not hypoxic.  White count 3.0, chronically low.  Lactic acid normal.  Hemoglobin 7.5, baseline in the 7-8 range.  Platelet count 126,000.  Corrected sodium 129, chronically low.  Bicarb 16, previously low as well.   Anion gap 12.  BUN 59.  Creatinine 2.0, was 1.1 in May and has been trending up since then.  UA and urine culture pending.  INR 3.1.  ESR and CRP elevated.  LFTs normal.  BNP 254.  Blood culture x2 pending.  COVID-19 rapid test pending.  RMSF antibody IgM/IgG pending.  Lyme disease PCR pending.  Chest x-ray showing cardiomegaly with vascular congestion and diffuse bilateral interstitial and groundglass opacity suggesting pulmonary edema.  Also showing confluent airspace disease at the right lung base which could reflect superimposed pneumonia.  Patient received vancomycin, cefepime, doxycycline, and 200 cc IV fluid in the ED.  Patient admitted as above with 1 week of rash.  Patient indicates that she had recently had blood transfusion and "bloodshot" 1 day and rash was noted on Tuesday morning when she awoke.  Patient also reports new medication of allopurinol but has been on this for approximately 2 weeks prior to rash onset. Rash is mildly pruritic, patient reports temperatures at home as high as 1-1.8.  At this point patient's rash and symptoms are markedly improving, labs essentially unremarkable as below patient's double-stranded DNA antibodies are unremarkable, hepatitis panel negative, HIV screen unremarkable, multiple myeloma panel unremarkable other than minimally elevated IgM patient's complement panel also unremarkable patient's ESR is minimally elevated at 114 however this appears to be near her baseline which was reported around 80 some 6 months ago.  At this time given patient symptoms have been improving with supportive care will discharge home, follow with PCP next week for further evaluation and  treatment given negative findings here and generally improving symptoms. Recommend taking steroid taper prescribed by PCP earlier this week that patient had been avoiding taking. Continue topical benadryl cream.  Discharge Diagnoses:  Principal Problem:   Rash Active Problems:   Anemia   Acute on  chronic diastolic heart failure (HCC)   Diabetes mellitus type 2, controlled ([place])   Renal insufficiency  Discharge Instructions  Discharge Instructions    Diet - low sodium heart healthy   Complete by: As directed    Increase activity slowly   Complete by: As directed      Allergies as of 05/09/2019      Reactions   Flagyl [metronidazole] Rash   ALL-OVER BODY RASH   Coreg [carvedilol] Other (See Comments)   Terrible cramping in the feet and had a lot of bowel movements, but not diarrhea   Losartan Swelling   Patient doesn't recall site of swelling   Verapamil Hives   Zetia [ezetimibe] Other (See Comments)   Reaction not recalled   Zocor [simvastatin - High Dose] Other (See Comments)   Reaction not recalled      Medication List    TAKE these medications   acetaminophen 325 MG tablet Commonly known as: TYLENOL Take 325 mg by mouth every 6 (six) hours as needed for mild pain.   allopurinol 100 MG tablet Commonly known as: ZYLOPRIM Take 100 mg by mouth daily.   ALPRAZolam 0.5 MG tablet Commonly known as: XANAX Take 0.25 mg by mouth at bedtime.   atorvastatin 80 MG tablet Commonly known as: LIPITOR Take 1 tablet (80 mg total) by mouth daily at 6 PM.   diphenhydrAMINE-zinc acetate cream Commonly known as: BENADRYL Apply topically 3 (three) times daily as needed for itching.   feeding supplement (GLUCERNA SHAKE) Liqd Take 237 mLs by mouth 2 (two) times daily between meals.   ferrous sulfate 325 (65 FE) MG tablet Take 1 tablet (325 mg total) by mouth 2 (two) times daily with a meal.   furosemide 40 MG tablet Commonly known as: LASIX Take 80 mg by mouth daily.   hydrALAZINE 100 MG tablet Commonly known as: APRESOLINE TAKE 1 TABLET (100 MG TOTAL) BY MOUTH 3 (THREE) TIMES DAILY.   irbesartan 300 MG tablet Commonly known as: AVAPRO Take 300 mg by mouth daily.   magnesium oxide 400 (241.3 Mg) MG tablet Commonly known as: MAG-OX Take 1 tablet (400 mg total) by  mouth daily.   metFORMIN 500 MG tablet Commonly known as: GLUCOPHAGE Take 500 mg by mouth 2 (two) times daily with a meal.   metoprolol tartrate 25 MG tablet Commonly known as: LOPRESSOR Take 0.5 tablets (12.5 mg total) by mouth 2 (two) times daily.   ondansetron 4 MG disintegrating tablet Commonly known as: Zofran ODT Take 1 tablet (4 mg total) by mouth every 8 (eight) hours as needed for nausea or vomiting.   pantoprazole 40 MG tablet Commonly known as: PROTONIX Take 40 mg by mouth 2 (two) times daily.   PROBIOTIC PO Take 1 tablet by mouth daily.   spironolactone 25 MG tablet Commonly known as: ALDACTONE Take 25 mg by mouth daily.   tadalafil 20 MG tablet Commonly known as: CIALIS Take 1 tablet (20 mg total) by mouth 2 (two) times daily.   Vitamin B12 1000 MCG Tbcr Take 1,000 mcg by mouth daily with lunch.   vitamin C 500 MG tablet Commonly known as: ASCORBIC ACID Take 500 mg by mouth daily with lunch.   Vitamin  D3 25 MCG (1000 UT) Caps Take 1,000 Units by mouth daily with lunch.   warfarin 5 MG tablet Commonly known as: COUMADIN Take as directed. If you are unsure how to take this medication, talk to your nurse or doctor. Original instructions: Take 5-7.5 mg by mouth daily. Take 1.5 tablets on Tues, Thursday, Saturday and Sunday. Take 1 tablet on Mondays, Wednesdays, and Fridays.       Allergies  Allergen Reactions  . Flagyl [Metronidazole] Rash    ALL-OVER BODY RASH  . Coreg [Carvedilol] Other (See Comments)    Terrible cramping in the feet and had a lot of bowel movements, but not diarrhea  . Losartan Swelling    Patient doesn't recall site of swelling  . Verapamil Hives  . Zetia [Ezetimibe] Other (See Comments)    Reaction not recalled  . Zocor [Simvastatin - High Dose] Other (See Comments)    Reaction not recalled   Procedures/Studies: Dg Chest Portable 1 View  Result Date: 05/08/2019 CLINICAL DATA:  Fever EXAM: PORTABLE CHEST 1 VIEW COMPARISON:   04/04/2019, 02/28/2019 FINDINGS: Post sternotomy changes. Cardiomegaly with vascular congestion. Diffuse bilateral interstitial and ground-glass opacity, possible edema. More confluent opacity at the right base may reflect superimposed pneumonia. No pneumothorax. IMPRESSION: 1. Cardiomegaly with vascular congestion and diffuse bilateral interstitial and ground-glass opacity suggesting pulmonary edema. 2. More confluent airspace disease at the right base, could reflect superimposed pneumonia Electronically Signed   By: Donavan Foil M.D.   On: 05/08/2019 02:25     Subjective: No acute events overnight, denies fevers, chills, nausea, vomiting, diarrhea, constipation, headache.   Discharge Exam: Vitals:   05/09/19 0259 05/09/19 0802  BP: 137/72 (!) 155/53  Pulse: 65 87  Resp: 18 19  Temp: 98.2 F (36.8 C) 99.4 F (37.4 C)  SpO2: 100% 100%   Vitals:   05/08/19 1829 05/08/19 1916 05/09/19 0259 05/09/19 0802  BP: (!) 173/53 (!) 184/66 137/72 (!) 155/53  Pulse: 65 63 65 87  Resp: '18 18 18 19  '$ Temp: 97.7 F (36.5 C) 98 F (36.7 C) 98.2 F (36.8 C) 99.4 F (37.4 C)  TempSrc: Oral  Oral Oral  SpO2: 100% 100% 100% 100%  Weight:   54.1 kg   Height:        General:  Pleasantly resting in bed, No acute distress. HEENT:  Normocephalic atraumatic.  Sclerae nonicteric, noninjected.  Extraocular movements intact bilaterally. Neck:  Without mass or deformity.  Trachea is midline. Lungs:  Clear to auscultate bilaterally without rhonchi, wheeze, or rales. Heart:  Regular rate and rhythm.  Without murmurs, rubs, or gallops. Abdomen:  Soft, nontender, nondistended.  Without guarding or rebound. Extremities: Without cyanosis, clubbing, edema, or obvious deformity. Vascular:  Dorsalis pedis and posterior tibial pulses palpable bilaterally. Skin: Moderate blanching erythema diffusely over the torso and limbs, sparing of the palms and dorsum of feet, sparing mouth/oropharynx.  Notable purpura over the  proximal lower extremities, chronic in nature, nonblanching.  Otherwise warm and dry, no erythema, no ulcerations.  The results of significant diagnostics from this hospitalization (including imaging, microbiology, ancillary and laboratory) are listed below for reference.     Microbiology: Recent Results (from the past 240 hour(s))  Blood culture (routine x 2)     Status: None (Preliminary result)   Collection Time: 05/08/19  2:20 AM   Specimen: BLOOD  Result Value Ref Range Status   Specimen Description BLOOD RIGHT ARM  Final   Special Requests   Final  BOTTLES DRAWN AEROBIC AND ANAEROBIC Blood Culture adequate volume   Culture   Final    NO GROWTH 1 DAY Performed at [facility], [place] [address], [place], [place] [address]    Report Status PENDING  Incomplete  Blood culture (routine x 2)     Status: None (Preliminary result)   Collection Time: 05/08/19  2:35 AM   Specimen: BLOOD  Result Value Ref Range Status   Specimen Description BLOOD LEFT ARM  Final   Special Requests   Final    BOTTLES DRAWN AEROBIC AND ANAEROBIC Blood Culture adequate volume   Culture   Final    NO GROWTH 1 DAY Performed at [facility], [place] [address], [place], [place] [address]    Report Status PENDING  Incomplete  SARS Coronavirus 2 (CEPHEID- Performed in [place] hospital lab), Hosp Order     Status: None   Collection Time: 05/08/19  2:40 AM   Specimen: Nasopharyngeal Swab  Result Value Ref Range Status   SARS Coronavirus 2 NEGATIVE NEGATIVE Final    Comment: (NOTE) If result is NEGATIVE SARS-CoV-2 target nucleic acids are NOT DETECTED. The SARS-CoV-2 RNA is generally detectable in upper and lower  respiratory specimens during the acute phase of infection. The lowest  concentration of SARS-CoV-2 viral copies this assay can detect is 250  copies / mL. A negative result does not preclude SARS-CoV-2 infection  and should not be used as the sole basis for treatment or other  patient  management decisions.  A negative result may occur with  improper specimen collection / handling, submission of specimen other  than nasopharyngeal swab, presence of viral mutation(s) within the  areas targeted by this assay, and inadequate number of viral copies  (<250 copies / mL). A negative result must be combined with clinical  observations, patient history, and epidemiological information. If result is POSITIVE SARS-CoV-2 target nucleic acids are DETECTED. The SARS-CoV-2 RNA is generally detectable in upper and lower  respiratory specimens dur ing the acute phase of infection.  Positive  results are indicative of active infection with SARS-CoV-2.  Clinical  correlation with patient history and other diagnostic information is  necessary to determine patient infection status.  Positive results do  not rule out bacterial infection or co-infection with other viruses. If result is PRESUMPTIVE POSTIVE SARS-CoV-2 nucleic acids MAY BE PRESENT.   A presumptive positive result was obtained on the submitted specimen  and confirmed on repeat testing.  While 2019 novel coronavirus  (SARS-CoV-2) nucleic acids may be present in the submitted sample  additional confirmatory testing may be necessary for epidemiological  and / or clinical management purposes  to differentiate between  SARS-CoV-2 and other Sarbecovirus currently known to infect humans.  If clinically indicated additional testing with an alternate test  methodology [phone]) is advised. The SARS-CoV-2 RNA is generally  detectable in upper and lower respiratory sp ecimens during the acute  phase of infection. The expected result is Negative. Fact Sheet for Patients:  StrictlyIdeas.no Fact Sheet for Healthcare Providers: BankingDealers.co.za This test is not yet approved or cleared by the [place] FDA and has been authorized for detection and/or diagnosis of SARS-CoV-2 by FDA under  an Emergency Use Authorization (EUA).  This EUA will remain in effect (meaning this test can be used) for the duration of the COVID-19 declaration under Section 564(b)(1) of the Act, 21 U.S.C. section 360bbb-3(b)(1), unless the authorization is terminated or revoked sooner. Performed at [facility], [address], [place], [place] [address]   Urine culture     Status: Abnormal (Preliminary result)   Collection Time: 05/08/19  6:10 AM   Specimen: Urine, Clean Catch  Result Value Ref Range Status   Specimen Description URINE, CLEAN CATCH  Final   Special Requests   Final    NONE Performed at [facility], [place] [address] [place], [place] [address]    Culture >=100,000 COLONIES/mL KLEBSIELLA PNEUMONIAE (A)  Final   Report Status PENDING  Incomplete    Labs: BNP (last 3 results) Recent Labs    07/23/18 1117 05/08/19 0218  BNP 220.2* 366.4*   Basic Metabolic Panel: Recent Labs  Lab 05/05/19 1109 05/07/19 2311 05/08/19 1224 05/09/19 0624  NA 131* 128* 132* 132*  K 4.2 4.3 3.8 3.8  CL 104 100 103 103  CO2 19* 16* 21* 20*  GLUCOSE 194* 175* 119* 98  BUN 57* 59* 56* 51*  CREATININE 1.99* 2.06* 1.84* 1.77*  CALCIUM 8.4* 8.8* 9.1 8.6*   Liver Function Tests: Recent Labs  Lab 05/05/19 1109 05/08/19 0218  AST 20 26  ALT 10 14  ALKPHOS 83 73  BILITOT 0.5 1.1  PROT 6.8 6.7  ALBUMIN 2.8* 2.8*   No results for input(s): LIPASE, AMYLASE in the last 168 hours. No results for input(s): AMMONIA in the last 168 hours. CBC: Recent Labs  Lab 05/05/19 1109 05/07/19 2311 05/09/19 0624  WBC 2.7* 3.0* 3.2*  NEUTROABS 2.0  --   --   HGB 6.5* 7.5* 8.1*  HCT 20.6* 23.3* 24.4*  MCV 93.2 92.5 90.4  PLT 147* 126* 145*   CBG: Recent Labs  Lab 05/08/19 1300 05/08/19 1745 05/08/19 2102 05/09/19 0620 05/09/19 1208  GLUCAP 79 120* 198* 90 182*   Anemia work up No results for input(s): VITAMINB12, FOLATE, FERRITIN, TIBC, IRON, RETICCTPCT in the last 72 hours.    Urinalysis    Component Value Date/Time   COLORURINE YELLOW 05/08/2019 0715   APPEARANCEUR HAZY (A) 05/08/2019 0715   LABSPEC 1.010 05/08/2019 0715   PHURINE 5.0 05/08/2019 0715   GLUCOSEU NEGATIVE 05/08/2019 0715   HGBUR MODERATE (A) 05/08/2019 0715   BILIRUBINUR NEGATIVE 05/08/2019 0715   KETONESUR NEGATIVE 05/08/2019 0715   PROTEINUR 100 (A) 05/08/2019 0715   UROBILINOGEN 1.0 10/06/2011 1709   NITRITE NEGATIVE 05/08/2019 0715   LEUKOCYTESUR TRACE (A) 05/08/2019 0715   Sepsis Labs Invalid input(s): PROCALCITONIN,  WBC,  LACTICIDVEN   Microbiology Recent Results (from the past 240 hour(s))  Blood culture (routine x 2)     Status: None (Preliminary result)   Collection Time: 05/08/19  2:20 AM   Specimen: BLOOD  Result Value Ref Range Status   Specimen Description BLOOD RIGHT ARM  Final   Special Requests   Final    BOTTLES DRAWN AEROBIC AND ANAEROBIC Blood Culture adequate volume   Culture   Final    NO GROWTH 1 DAY Performed at [facility], [place] [address] [place],  [address]    Report Status PENDING  Incomplete  Blood culture (routine x 2)     Status: None (Preliminary result)   Collection Time: 05/08/19  2:35 AM   Specimen: BLOOD  Result Value Ref Range Status   Specimen Description BLOOD LEFT ARM  Final   Special Requests   Final    BOTTLES DRAWN AEROBIC AND ANAEROBIC Blood Culture adequate volume   Culture   Final    NO GROWTH 1 DAY  Performed at [facility], [place] [address] [place], [place] [address]    Report Status PENDING  Incomplete  SARS Coronavirus 2 (CEPHEID- Performed in [place] hospital lab), Hosp Order     Status: None   Collection Time: 05/08/19  2:40 AM   Specimen: Nasopharyngeal Swab  Result Value Ref Range Status   SARS Coronavirus 2 NEGATIVE NEGATIVE Final    Comment: (NOTE) If result is NEGATIVE SARS-CoV-2 target nucleic acids are NOT DETECTED. The SARS-CoV-2 RNA is generally detectable in upper and lower   respiratory specimens during the acute phase of infection. The lowest  concentration of SARS-CoV-2 viral copies this assay can detect is 250  copies / mL. A negative result does not preclude SARS-CoV-2 infection  and should not be used as the sole basis for treatment or other  patient management decisions.  A negative result may occur with  improper specimen collection / handling, submission of specimen other  than nasopharyngeal swab, presence of viral mutation(s) within the  areas targeted by this assay, and inadequate number of viral copies  (<250 copies / mL). A negative result must be combined with clinical  observations, patient history, and epidemiological information. If result is POSITIVE SARS-CoV-2 target nucleic acids are DETECTED. The SARS-CoV-2 RNA is generally detectable in upper and lower  respiratory specimens dur ing the acute phase of infection.  Positive  results are indicative of active infection with SARS-CoV-2.  Clinical  correlation with patient history and other diagnostic information is  necessary to determine patient infection status.  Positive results do  not rule out bacterial infection or co-infection with other viruses. If result is PRESUMPTIVE POSTIVE SARS-CoV-2 nucleic acids MAY BE PRESENT.   A presumptive positive result was obtained on the submitted specimen  and confirmed on repeat testing.  While 2019 novel coronavirus  (SARS-CoV-2) nucleic acids may be present in the submitted sample  additional confirmatory testing may be necessary for epidemiological  and / or clinical management purposes  to differentiate between  SARS-CoV-2 and other Sarbecovirus currently known to infect humans.  If clinically indicated additional testing with an alternate test  methodology [phone]) is advised. The SARS-CoV-2 RNA is generally  detectable in upper and lower respiratory sp ecimens during the acute  phase of infection. The expected result is Negative. Fact  Sheet for Patients:  StrictlyIdeas.no Fact Sheet for Healthcare Providers: BankingDealers.co.za This test is not yet approved or cleared by the [place] FDA and has been authorized for detection and/or diagnosis of SARS-CoV-2 by FDA under an Emergency Use Authorization (EUA).  This EUA will remain in effect (meaning this test can be used) for the duration of the COVID-19 declaration under Section 564(b)(1) of the Act, 21 U.S.C. section 360bbb-3(b)(1), unless the authorization is terminated or revoked sooner. Performed at [facility], [place] [address], [place], [place] [address]   Urine culture     Status: Abnormal (Preliminary result)   Collection Time: 05/08/19  6:10 AM   Specimen: Urine, Clean Catch  Result Value Ref Range Status   Specimen Description URINE, CLEAN CATCH  Final   Special Requests   Final    NONE Performed at [facility], [place] [address] [place], [place] [address]    Culture >=100,000 COLONIES/mL KLEBSIELLA PNEUMONIAE (A)  Final   Report Status PENDING  Incomplete   Time coordinating discharge: Over 30 minutes  SIGNED:  Little Ishikawa, DO [facility] 05/09/2019, 1:50 PM Pager   If 7PM-7AM,  please contact night-coverage [email or web address] Password [email or web address]

## 2019-05-09 NOTE — Progress Notes (Signed)
Initial Nutrition Assessment  DOCUMENTATION CODES:   Not applicable  INTERVENTION:   -Glucerna Shake po TID, each supplement provides 220 kcal and 10 grams of protein -MVI with minerals daily  NUTRITION DIAGNOSIS:   Inadequate oral intake related to decreased appetite as evidenced by meal completion < 25%.  GOAL:   Patient will meet greater than or equal to 90% of their needs  MONITOR:   PO intake, Supplement acceptance, Labs, Weight trends, Skin, I & O's  REASON FOR ASSESSMENT:   Malnutrition Screening Tool    ASSESSMENT:   [name] is a 78 y.o. female with medical history significant of atrial fibrillation, AVR with mechanical aortic valve on Coumadin, chronic diastolic congestive heart failure, CAD status post CABG, type 2 diabetes, hypertension, hyperlipidemia, PAD, hemolytic anemia, pulmonary hypertension, CKD presenting to the hospital for evaluation of fever, chills, and a rash.  Patient states she had transfusion of a medicine that helps build blood 3 days ago.  Then 2 days ago she started noticing a rash on her palms and legs.  Her rash is mildly pruritic.  Yesterday she was having chills and her friend checked her temperature and told her it was 101.8 F.  Denies any headaches or neck stiffness.  Denies any history of tick bites.  States she rarely goes outdoors, at most sits on her front porch.  She does not have any pet animals.  Denies any shortness of breath, cough, or chest pain.  States her legs are chronically swollen, no recent change.  Does report being started on allopurinol for gout 2 weeks ago and this is the only new medicine she is taking.  Denies noticing any blisters in her mouth.  States she was told her kidney function has been declining over time.  Pt admitted with fever and rash.   Reviewed I/O's: -340 ml x 24 hours  UOP: 700 ml x 24 hours  Spoke with pt at bedside, who expressed concern over weight loss and multiple hospitalizations. She  shares she has experienced a general decline in health over the past year, related to multiple hospitalizations related to anemia and CHF. Pt reports that she often only consumes 1-2 meals per day and has stopped eating meat, as the smell is off-putting to her. Observed lunch tray- pt consumed only a few bites of fruit.   Pt reports ongoing weight loss. Per her report, her UBW is around 140-150# and is concerned that she now weighs around 120#. Reviewed wt hx, which reveals a 3.1% wt loss over the past year, which is not significant for time frame.  Spent an extensive amount of time with pt educating her on how to increase calories and protein in her diet. Discussed small, frequent meals; liberalized diet when appetite is poor; healthy meal and snack ideas that require very little preparation; and choosing colder foods with loss odors to help alleviate nausea.   Lab Results  Component Value Date   HGBA1C 4.6 (L) 02/24/2019   PTA DM medications are none.   Labs reviewed: CBGS: 90-198 (inpatient orders for glycemic control are 0-5 units insulin aspart q HS, 0-9 units insulin aspart TID with meals, ).   NUTRITION - FOCUSED PHYSICAL EXAM:    Most Recent Value  Orbital Region  No depletion  Upper Arm Region  Mild depletion  Thoracic and Lumbar Region  No depletion  Buccal Region  No depletion  Temple Region  No depletion  Clavicle Bone Region  Mild depletion  Clavicle and  Acromion Bone Region  No depletion  Scapular Bone Region  No depletion  Dorsal Hand  Mild depletion  Patellar Region  No depletion  Anterior Thigh Region  No depletion  Posterior Calf Region  No depletion  Edema (RD Assessment)  Mild  Hair  Reviewed  Eyes  Reviewed  Mouth  Reviewed  Skin  Reviewed  Nails  Reviewed       Diet Order:   Diet Order            Diet - low sodium heart healthy        Diet Carb Modified Fluid consistency: Thin; Room service appropriate? Yes  Diet effective now               EDUCATION NEEDS:   Education needs have been addressed  Skin:  Skin Assessment: Reviewed RN Assessment  Last BM:  05/08/19  Height:   Ht Readings from Last 1 Encounters:  05/08/19 5\' 2"  (1.575 m)    Weight:   Wt Readings from Last 1 Encounters:  05/09/19 54.1 kg    Ideal Body Weight:  50 kg  BMI:  Body mass index is 21.8 kg/m.  Estimated Nutritional Needs:   Kcal:  1400-1600  Protein:  60-75 grams  Fluid:  > 1.4 L    Yamina Lenis A. Jimmye Norman, RD, LDN, [place] Registered Dietitian II Certified Diabetes Care and Education Specialist Pager: [phone] After hours Pager: [phone]

## 2019-05-09 NOTE — Care Management Obs Status (Signed)
[facility] NOTIFICATION   Patient Details  Name: [name] MRN: [id number] Date of Birth: 1941-04-05   Medicare Observation Status Notification Given:  Yes    Zenon Mayo, RN 05/09/2019, 10:05 AM

## 2019-05-09 NOTE — Care Management CC44 (Signed)
Condition Code 44 Documentation Completed  Patient Details  Name: [name] MRN: [id number] Date of Birth: 16-Nov-1940   Condition Code 44 given:  Yes Patient signature on Condition Code 44 notice:  Yes Documentation of 2 MD's agreement:  Yes Code 44 added to claim:  Yes    Zenon Mayo, RN 05/09/2019, 10:05 AM

## 2019-05-09 NOTE — TOC Initial Note (Signed)
Transition of Care [facility]) - Initial/Assessment Note    Patient Details  Name: [name] MRN: [id number] Date of Birth: May 28, 1941  [facility]) CM/SW Contact:    Zenon Mayo, RN Phone Number: 05/09/2019, 3:16 PM  Clinical Narrative:                 From home alone, indep, for dc today, no needs identified.  Expected Discharge Plan: Home/Self Care Barriers to Discharge: No Barriers Identified   Patient Goals and CMS Choice Patient states their goals for this hospitalization and ongoing recovery are:: get better   Choice offered to / list presented to : NA  Expected Discharge Plan and Services Expected Discharge Plan: Home/Self Care In-house Referral: NA Discharge Planning Services: CM Consult Post Acute Care Choice: NA Living arrangements for the past 2 months: Single Family Home Expected Discharge Date: 05/09/19               DME Arranged: (NA)         HH Arranged: NA          Prior Living Arrangements/Services Living arrangements for the past 2 months: Single Family Home Lives with:: Self Patient language and need for interpreter reviewed:: Yes        Need for Family Participation in Patient Care: No (Comment) Care giver support system in place?: No (comment)   Criminal Activity/Legal Involvement Pertinent to Current Situation/Hospitalization: No - Comment as needed  Activities of Daily Living Home Assistive Devices/Equipment: Walker (specify type) ADL Screening (condition at time of admission) Patient's cognitive ability adequate to safely complete daily activities?: Yes Is the patient deaf or have difficulty hearing?: No Does the patient have difficulty seeing, even when wearing glasses/contacts?: No Does the patient have difficulty concentrating, remembering, or making decisions?: No Patient able to express need for assistance with ADLs?: Yes Does the patient have difficulty dressing or bathing?: Yes Independently performs  ADLs?: Yes (appropriate for developmental age) Does the patient have difficulty walking or climbing stairs?: No Weakness of Legs: Both Weakness of Arms/Hands: None  Permission Sought/Granted                  Emotional Assessment Appearance:: Appears stated age Attitude/Demeanor/Rapport: Gracious Affect (typically observed): Appropriate Orientation: : Oriented to Self, Oriented to Place, Oriented to  Time, Oriented to Situation Alcohol / Substance Use: Not Applicable Psych Involvement: No (comment)  Admission diagnosis:  Purpura ([place]) [D69.2] Acute pulmonary edema (HCC) [J81.0] Pancytopenia ([place]) [D61.818] Fever, unspecified fever cause [R50.9] Pneumonia of right lower lobe due to infectious organism ([place]) [J18.1] Acute renal failure superimposed on chronic kidney disease, unspecified CKD stage, unspecified acute renal failure type ([place]) [N17.9, N18.9] Patient Active Problem List   Diagnosis Date Noted  . Renal insufficiency 05/08/2019  . Acute on chronic diastolic CHF (congestive heart failure) ([place]) 04/07/2019  . Left leg cellulitis 04/07/2019  . PAF (paroxysmal atrial fibrillation) ([place]) 04/07/2019  . Iron deficiency anemia 02/28/2019  . 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 ([place]) 02/28/2019  . CKD (chronic kidney disease) stage 3, GFR 30-59 ml/min (HCC) 02/24/2019  . Malnutrition of moderate degree 03/12/2018  . Acute on chronic diastolic heart failure ([place]) 03/06/2018  . Diabetes mellitus type 2, controlled ([place]) 03/06/2018  . S/P cholecystectomy 03/06/2018  . HTN (hypertension) 03/06/2018  . H/O mechanical aortic valve replacement 03/06/2018  . Acute hyponatremia 03/06/2018  . Acute respiratory failure with hypoxia ([place]) 03/06/2018  . Moderate to severe pulmonary hypertension ([place]) 03/06/2018  .  UTI (urinary tract infection) 02/22/2018  . Cholecystitis 02/21/2018  . GERD (gastroesophageal reflux disease) 02/21/2018  . Anxiety 02/21/2018  .  Chronic diastolic CHF (congestive heart failure) ([place]) 02/21/2018  . Diarrhea 02/21/2018  . Rash 02/21/2018  . General weakness 02/02/2018  . Macular rash 01/25/2018  . Thrombocytopenia ([place]) 01/25/2018  . Arm pain, diffuse 01/25/2018  . Anemia 01/23/2018  . Cholelithiasis 01/23/2018  . Aortic atherosclerosis ([place]) 01/23/2018  . GI bleed 01/18/2018  . Coronary artery disease 08/18/2013  . Carotid artery disease ([place]) 08/18/2013  . Peripheral arterial disease ([place]) 08/18/2013  . Diabetes mellitus without complication ([place]) 16/07/9603  . Benign positional vertigo 10/06/2011  . Hyponatremia 10/06/2011  . Influenza 10/06/2011  . Sinusitis 10/06/2011  . Hypertension   . Hypercholesteremia   . Mechanical heart valve present   . Chronic anticoagulation    PCP:  [name], [name], MD Pharmacy:   [facility], [address] [place] [place] [address] Phone: [phone] Fax: [phone]     Social Determinants of Health (SDOH) Interventions    Readmission Risk Interventions Readmission Risk Prevention Plan 05/09/2019  Transportation Screening Complete  Medication Review ([place]) Complete  PCP or Specialist appointment within 3-5 days of discharge Complete  HRI or [place] Complete  SW Recovery Care/Counseling Consult Complete  [place] Not Applicable  Some recent data might be hidden

## 2019-05-10 LAB — MPO/PR-3 (ANCA) ANTIBODIES
ANCA Proteinase 3: 40.3 U/mL — ABNORMAL HIGH (ref 0.0–3.5)
Myeloperoxidase Abs: 20.6 U/mL — ABNORMAL HIGH (ref 0.0–9.0)

## 2019-05-10 LAB — ROCKY MTN SPOTTED FVR ABS PNL(IGG+IGM)
RMSF IgG: NEGATIVE
RMSF IgM: 1.57 index — ABNORMAL HIGH (ref 0.00–0.89)

## 2019-05-10 LAB — URINE CULTURE: Culture: >=100000 — AB

## 2019-05-12 ENCOUNTER — Other Ambulatory Visit: Payer: Self-pay | Admitting: *Deleted

## 2019-05-12 ENCOUNTER — Inpatient Hospital Stay: Payer: Medicare HMO | Attending: Oncology

## 2019-05-12 ENCOUNTER — Other Ambulatory Visit: Payer: Self-pay | Admitting: Oncology

## 2019-05-12 ENCOUNTER — Other Ambulatory Visit: Payer: Self-pay

## 2019-05-12 ENCOUNTER — Inpatient Hospital Stay: Payer: Medicare HMO

## 2019-05-12 ENCOUNTER — Ambulatory Visit (HOSPITAL_BASED_OUTPATIENT_CLINIC_OR_DEPARTMENT_OTHER): Payer: Medicare HMO | Admitting: Oncology

## 2019-05-12 ENCOUNTER — Encounter: Payer: Self-pay | Admitting: Pharmacist

## 2019-05-12 ENCOUNTER — Other Ambulatory Visit: Payer: Self-pay | Admitting: Pharmacist

## 2019-05-12 VITALS — BP 185/46 | HR 63 | Temp 98.1°F | Resp 16

## 2019-05-12 DIAGNOSIS — E785 Hyperlipidemia, unspecified: Secondary | ICD-10-CM | POA: Diagnosis not present

## 2019-05-12 DIAGNOSIS — N189 Chronic kidney disease, unspecified: Secondary | ICD-10-CM | POA: Insufficient documentation

## 2019-05-12 DIAGNOSIS — N342 Other urethritis: Secondary | ICD-10-CM

## 2019-05-12 DIAGNOSIS — K648 Other hemorrhoids: Secondary | ICD-10-CM | POA: Diagnosis not present

## 2019-05-12 DIAGNOSIS — D500 Iron deficiency anemia secondary to blood loss (chronic): Secondary | ICD-10-CM

## 2019-05-12 DIAGNOSIS — I272 Pulmonary hypertension, unspecified: Secondary | ICD-10-CM | POA: Diagnosis not present

## 2019-05-12 DIAGNOSIS — I2510 Atherosclerotic heart disease of native coronary artery without angina pectoris: Secondary | ICD-10-CM | POA: Diagnosis not present

## 2019-05-12 DIAGNOSIS — D696 Thrombocytopenia, unspecified: Secondary | ICD-10-CM | POA: Diagnosis not present

## 2019-05-12 DIAGNOSIS — Z5112 Encounter for antineoplastic immunotherapy: Secondary | ICD-10-CM | POA: Insufficient documentation

## 2019-05-12 DIAGNOSIS — R7989 Other specified abnormal findings of blood chemistry: Secondary | ICD-10-CM | POA: Insufficient documentation

## 2019-05-12 DIAGNOSIS — K5730 Diverticulosis of large intestine without perforation or abscess without bleeding: Secondary | ICD-10-CM | POA: Diagnosis not present

## 2019-05-12 DIAGNOSIS — D631 Anemia in chronic kidney disease: Secondary | ICD-10-CM | POA: Diagnosis not present

## 2019-05-12 DIAGNOSIS — D594 Other nonautoimmune hemolytic anemias: Secondary | ICD-10-CM

## 2019-05-12 DIAGNOSIS — D649 Anemia, unspecified: Secondary | ICD-10-CM

## 2019-05-12 DIAGNOSIS — Z87440 Personal history of urinary (tract) infections: Secondary | ICD-10-CM | POA: Diagnosis not present

## 2019-05-12 DIAGNOSIS — I119 Hypertensive heart disease without heart failure: Secondary | ICD-10-CM | POA: Insufficient documentation

## 2019-05-12 DIAGNOSIS — M8580 Other specified disorders of bone density and structure, unspecified site: Secondary | ICD-10-CM | POA: Diagnosis not present

## 2019-05-12 DIAGNOSIS — I776 Arteritis, unspecified: Secondary | ICD-10-CM | POA: Diagnosis not present

## 2019-05-12 DIAGNOSIS — I4891 Unspecified atrial fibrillation: Secondary | ICD-10-CM | POA: Diagnosis not present

## 2019-05-12 DIAGNOSIS — Z7901 Long term (current) use of anticoagulants: Secondary | ICD-10-CM | POA: Diagnosis not present

## 2019-05-12 DIAGNOSIS — I739 Peripheral vascular disease, unspecified: Secondary | ICD-10-CM

## 2019-05-12 DIAGNOSIS — E7800 Pure hypercholesterolemia, unspecified: Secondary | ICD-10-CM | POA: Diagnosis not present

## 2019-05-12 DIAGNOSIS — D508 Other iron deficiency anemias: Secondary | ICD-10-CM

## 2019-05-12 LAB — CBC WITH DIFFERENTIAL/PLATELET
Abs Immature Granulocytes: 0.02 10*3/uL (ref 0.00–0.07)
Basophils Absolute: 0 10*3/uL (ref 0.0–0.1)
Basophils Relative: 0 %
Eosinophils Absolute: 0.1 10*3/uL (ref 0.0–0.5)
Eosinophils Relative: 4 %
HCT: 22.8 % — ABNORMAL LOW (ref 36.0–46.0)
Hemoglobin: 7.2 g/dL — ABNORMAL LOW (ref 12.0–15.0)
Immature Granulocytes: 1 %
Lymphocytes Relative: 16 %
Lymphs Abs: 0.5 10*3/uL — ABNORMAL LOW (ref 0.7–4.0)
MCH: 29.1 pg (ref 26.0–34.0)
MCHC: 31.6 g/dL (ref 30.0–36.0)
MCV: 92.3 fL (ref 80.0–100.0)
Monocytes Absolute: 0.2 10*3/uL (ref 0.1–1.0)
Monocytes Relative: 5 %
Neutro Abs: 2.3 10*3/uL (ref 1.7–7.7)
Neutrophils Relative %: 74 %
Platelets: 169 10*3/uL (ref 150–400)
RBC: 2.47 MIL/uL — ABNORMAL LOW (ref 3.87–5.11)
RDW: 15.2 % (ref 11.5–15.5)
WBC: 3.2 10*3/uL — ABNORMAL LOW (ref 4.0–10.5)
nRBC: 0 % (ref 0.0–0.2)

## 2019-05-12 LAB — SAMPLE TO BLOOD BANK

## 2019-05-12 LAB — CMP (CANCER CENTER ONLY)
ALT: 12 U/L (ref 0–44)
AST: 20 U/L (ref 15–41)
Albumin: 2.9 g/dL — ABNORMAL LOW (ref 3.5–5.0)
Alkaline Phosphatase: 72 U/L (ref 38–126)
Anion gap: 10 (ref 5–15)
BUN: 69 mg/dL — ABNORMAL HIGH (ref 8–23)
CO2: 17 mmol/L — ABNORMAL LOW (ref 22–32)
Calcium: 8.8 mg/dL — ABNORMAL LOW (ref 8.9–10.3)
Chloride: 104 mmol/L (ref 98–111)
Creatinine: 1.81 mg/dL — ABNORMAL HIGH (ref 0.44–1.00)
GFR, Est AFR Am: 30 mL/min — ABNORMAL LOW (ref >60)
GFR, Estimated: 26 mL/min — ABNORMAL LOW (ref >60)
Glucose, Bld: 175 mg/dL — ABNORMAL HIGH (ref 70–99)
Potassium: 3.9 mmol/L (ref 3.5–5.1)
Sodium: 131 mmol/L — ABNORMAL LOW (ref 135–145)
Total Bilirubin: 0.7 mg/dL (ref 0.3–1.2)
Total Protein: 7 g/dL (ref 6.5–8.1)

## 2019-05-12 LAB — ANCA TITERS
Atypical P-ANCA titer: <1:20 {titer}
C-ANCA: <1:20 {titer}
P-ANCA: 1:640 {titer} — ABNORMAL HIGH

## 2019-05-12 LAB — RETICULOCYTES
Immature Retic Fract: 22.4 % — ABNORMAL HIGH (ref 2.3–15.9)
RBC.: 2.45 MIL/uL — ABNORMAL LOW (ref 3.87–5.11)
Retic Count, Absolute: 92.9 10*3/uL (ref 19.0–186.0)
Retic Ct Pct: 3.8 % — ABNORMAL HIGH (ref 0.4–3.1)

## 2019-05-12 LAB — CRYOGLOBULIN

## 2019-05-12 LAB — IRON AND TIBC
Iron: 47 ug/dL (ref 41–142)
Saturation Ratios: 18 % — ABNORMAL LOW (ref 21–57)
TIBC: 263 ug/dL (ref 236–444)
UIBC: 216 ug/dL (ref 120–384)

## 2019-05-12 LAB — SAVE SMEAR(SSMR), FOR PROVIDER SLIDE REVIEW

## 2019-05-12 LAB — PREPARE RBC (CROSSMATCH)

## 2019-05-12 LAB — FERRITIN: Ferritin: 197 ng/mL (ref 11–307)

## 2019-05-12 MED ORDER — SODIUM CHLORIDE 0.9% FLUSH
10.0000 mL | INTRAVENOUS | Status: DC | PRN
  Filled 2019-05-12: qty 10

## 2019-05-12 MED ORDER — ACETAMINOPHEN 325 MG PO TABS
ORAL_TABLET | ORAL | Status: AC
  Filled 2019-05-12: qty 2

## 2019-05-12 MED ORDER — SODIUM CHLORIDE 0.9 % IV SOLN
510.0000 mg | Freq: Once | INTRAVENOUS | Status: AC
  Administered 2019-05-12: 510 mg via INTRAVENOUS
  Filled 2019-05-12: qty 17

## 2019-05-12 MED ORDER — DIPHENHYDRAMINE HCL 25 MG PO CAPS: 25.0000 mg | ORAL_CAPSULE | Freq: Once | ORAL | Status: DC

## 2019-05-12 MED ORDER — ACETAMINOPHEN 325 MG PO TABS
650.0000 mg | ORAL_TABLET | Freq: Once | ORAL | Status: AC
  Administered 2019-05-12: 650 mg via ORAL

## 2019-05-12 MED ORDER — SODIUM CHLORIDE 0.9 % IV SOLN
INTRAVENOUS | Status: DC
  Administered 2019-05-12: 14:00:00 via INTRAVENOUS
  Filled 2019-05-12: qty 250

## 2019-05-12 MED ORDER — SODIUM CHLORIDE 0.9% IV SOLUTION
250.0000 mL | Freq: Once | INTRAVENOUS | Status: AC
  Administered 2019-05-12: 250 mL via INTRAVENOUS
  Filled 2019-05-12: qty 250

## 2019-05-12 NOTE — Patient Outreach (Signed)
[facility]) Care Management  05/12/2019  [name] 1941-05-23 [id number]   Notified that member was discharged from hospital on Friday, 7/31.  Primary MD office will complete transition of care assessment.  Call placed to member to follow up on discharge and needs, no answer.  Unable to leave voice message, will follow up within the next 3-4 business days.  [name], [place], MSN [place] [phone]

## 2019-05-12 NOTE — Progress Notes (Unsigned)
[name]  [phone] [phone]) [phone]    ID: IDABELLE MCPETERS DOB: 1941/05/04  MR#: [id number]  OHY#:[id number]  Patient Care Team: [name], MD as PCP - General (Internal Medicine) [name], MD as PCP - Advanced Heart Failure (Cardiology) Lorretta Harp, MD as PCP - Cardiology (Cardiology) [name], RN as [place] Management Raylei Losurdo, [name], MD as Consulting Physician (Hematology and Oncology) Arta Silence, MD as Consulting Physician (Gastroenterology) [name], MD as Consulting Physician (Cardiology) Marzetta Board, DPM as Consulting Physician (Podiatry) Gavin Pound, MD as Consulting Physician (Rheumatology) [name], [facility] as [place] Management (Pharmacist) OTHER MD:   CHIEF COMPLAINT: anemia and thrombocytopenia  CURRENT TREATMENT: Feraheme, Aranesp  INTERVAL HISTORY: Dariana was seen today for follow-up and treatment of her anemia and thrombocytopenia associated with chronic inflammatory disease.   She  received Feraheme for her iron deficiency anemia on 03/01/2019 and 03/20/2019, with a third dose on 05/05/2019 20-1/4 dose planned for today.  She had a significant reticulocyte response to iron infusion Results for TYSHAY, ADEE (MRN [id number]) as of 05/12/2019 13:00  Ref. Range 03/20/2019 10:03 04/21/2019 10:28 04/28/2019 14:15 05/05/2019 11:10 05/12/2019 12:16  Retic Count, Absolute Latest Ref Range: 19.0 - 186.0 K/uL 94.7 60.3 54.5 63.4 92.9    But note that her ferritin level continues to drop Results for ELANDA, GARMANY (MRN [id number]) as of 05/12/2019 13:00  Ref. Range 03/07/2019 11:07 03/20/2019 10:04 04/21/2019 10:27 04/28/2019 14:15 05/05/2019 11:09  Ferritin Latest Ref Range: 11 - 307 ng/mL 652 (H) 338 (H) 251 156 118   After her most recent Feraheme and Aranesp she presented to the hospital on 05/08/2019 with a total body rash.  She also had a fever.   Extensive lab work that admission included hepatitis B and C antibodies, all of which were negative, total, C3, and C4 complement which was negative, a negative anti-DNA double-stranded antibody, negative cryoglobulin and negative HIV.  She did have a urine culture positive for Klebsiella at more than 100,000 colonies, resistant to ampicillin but otherwise sensitive.  She did receive empiric vancomycin, cefepime, and doxycycline that admission.  She is also scheduled for an additional dose of Aranesp today as well as 1 unit of packed red cells.  We are going to hold off on repeating Aranesp until next week  REVIEW OF SYSTEMS: Earnstine tells me she has no urinary symptoms at present.  Her fever has resolved.  She is on day 3 of a Medrol Dosepak and her rash is flatter, less itchy, and basically regressing.  Aside from these issues a detailed review of systems today was stable  HISTORY OF CURRENT ILLNESS: From the original consult note:  TANAISHA PITTMAN is an 78 y.o. female from [place], [place].  She has a past medical history significant forPAD s/p stenting; moderate to severe pulmonary HTN; afib and AVR on Coumadin; HTN; HLD; DM; and CAD s/p CABG. the patient was admitted to the hospital for anemia.  She was having increasing fatigue and shortness of breath and had routine lab work drawn which showed a hemoglobin of 6.6.  Due to her abnormal lab work, she was advised to come to the emergency room for admission.  She was also admitted in February 2020 for symptomatic anemia.  She reports she has been anemic for at least a year and has been taking oral iron.  She underwent a colonoscopy and EGD on 01/22/2018.  The colonoscopy showed diverticulosis in the sigmoid colon and descending colon, internal hemorrhoids.  The upper endoscopy was normal except for erythematous mucosa in the antrum.  She had a repeat EGD on 11/21/2018 that showed a few minimally oozing( with scope trauma) superficial gastric  ulcers with pigmented material were found in the gastric body. The largest lesion was 4 mm in largest dimension.  She underwent another upper endoscopy on 02/27/2019 which showed patchy mildly friable mucosa with contact bleeding was found in the gastric fundus, in the gastric body and in the gastric antrum.  The patient is maintained on warfarin due to A. fib and a mechanical aortic valve.  Review of the patient's chart shows that she has been anemic dating back to at least December thousand 12 (these are the earliest labs available to me).  Her highest hemoglobin was 11.6 in December 2012.  I also note that the patient has had intermittent mild leukopenia adding back to at least April 2019.  Her lowest white blood cell count was 2.6 in April 2019.  Since admission, the patient has received 1 unit of packed red blood cells.  She is currently on heparin for her procedure with plan to bridge back to Coumadin.  Stool for occult blood was negative x1 on 02/24/2019.  Hematology was asked see the patient to make recommendations regarding her anemia.  [name] subsequent history is as detailed below.   PAST MEDICAL HISTORY: Past Medical History:  Diagnosis Date  . Aortic atherosclerosis ([place]) 01/23/2018  . Atrial fibrillation ([place])   . Carotid artery disease ([place])   . Cholelithiasis 01/23/2018  . Chronic anticoagulation   . Coronary artery disease    status post coronary artery bypass grafting times 07/10/2004  . Diabetes mellitus   . Hypercholesteremia   . Hypertension   . Mechanical heart valve present    H. aortic valve replacement at the time of bypass surgery October 2005  . Moderate to severe pulmonary hypertension ([place])   . Peripheral arterial disease ([facility])    history of left common iliac artery PTA and stenting for a chronic total occlusion 08/26/01     PAST SURGICAL HISTORY: Past Surgical History:  Procedure Laterality Date  . AORTIC VALVE REPLACEMENT    . CARDIAC CATHETERIZATION   11/10/2004   40% right common illiac, 70% in stent restenosis of distal left common illiac,   . CARDIAC CATHETERIZATION  05/18/2004   LAD 50-70% midstenosis, RCA dominant w/50% stenosis, 50% Right common Illiac artery ostial stenosis, 90% in stent restenosis within midportion of left common illiac stent  . Carotid Duplex  03/12/2012   RSA-elev. velocities suggestive of a 50-69% diameter reduction, Right&Left Bulb/Prox ICA-mild-mod.fibrous plaqueelevating Velocities abnormal study.  . CHOLECYSTECTOMY N/A 03/01/2018   Procedure: LAPAROSCOPIC CHOLECYSTECTOMY;  Surgeon: Judeth Horn, MD;  Location: [place];  Service: General;  Laterality: N/A;  . COLONOSCOPY WITH PROPOFOL N/A 01/22/2018   Procedure: COLONOSCOPY WITH PROPOFOL;  Surgeon: Wilford Corner, MD;  Location: [place];  Service: Endoscopy;  Laterality: N/A;  . ESOPHAGOGASTRODUODENOSCOPY (EGD) WITH PROPOFOL N/A 01/22/2018   Procedure: ESOPHAGOGASTRODUODENOSCOPY (EGD) WITH PROPOFOL;  Surgeon: Wilford Corner, MD;  Location: [place];  Service: Endoscopy;  Laterality: N/A;  . ESOPHAGOGASTRODUODENOSCOPY (EGD) WITH PROPOFOL N/A 11/21/2018   Procedure: ESOPHAGOGASTRODUODENOSCOPY (EGD) WITH PROPOFOL;  Surgeon: [name], MD;  Location: [place];  Service: Gastroenterology;  Laterality: N/A;  . ESOPHAGOGASTRODUODENOSCOPY (EGD) WITH PROPOFOL Left 02/27/2019   Procedure: ESOPHAGOGASTRODUODENOSCOPY (EGD) WITH PROPOFOL;  Surgeon: Arta Silence, MD;  Location:  [facility];  Service: Endoscopy;  Laterality: Left;  . GIVENS CAPSULE STUDY N/A 11/21/2018   Procedure: GIVENS CAPSULE STUDY;  Surgeon: [name], MD;  Location: [place];  Service: Gastroenterology;  Laterality: N/A;  To be deployed during EGD  . Lower Ext. Duplex  03/12/2012   Right Proximal CIA- vessel narrowing w/elevated velocities 0-49% diameter reduction. Right SFA-mild mixed density plaque throughout vessel.  [name] NM MYOCAR PERF WALL MOTION  05/19/2010   protocol:  Persantine, post stress EF 65%, negative for ischemia, low risk scan  . RIGHT HEART CATH N/A 06/27/2018   Procedure: RIGHT HEART CATH;  Surgeon: [name], MD;  Location: [facility];  Service: Cardiovascular;  Laterality: N/A;  . TRANSTHORACIC ECHOCARDIOGRAM  08/29/2012   Moderately calcified annulus of mitral valve, moderate regurg. of both mitral valve and tricuspid valve.      FAMILY HISTORY: Family History  Problem Relation Age of Onset  . Breast cancer Neg Hx     GYNECOLOGIC HISTORY:  No LMP recorded. Patient has had a hysterectomy. Menarche:  years old Age at first live birth:  George P: 1 LMP:  Contraceptive:  HRT:   Hysterectomy?: yes BSO?:    SOCIAL HISTORY: (Current as of 03/07/2019) Ree is widowed.  She lives alone here in [place], [place].  She has 3 children that live locally.  She smoked three quarters of a pack of cigarettes daily for 50 years.  She quit in 2005.  Denies alcohol use.   ADVANCED DIRECTIVES: Not in place    HEALTH MAINTENANCE: Social History   Tobacco Use  . Smoking status: Former Smoker    Packs/day: 1.00    Years: 30.00    Pack years: 30.00    Types: Cigarettes  . Smokeless tobacco: Never Used  . Tobacco comment: quit smoking 2005  Substance Use Topics  . Alcohol use: No    Alcohol/week: 0.0 standard drinks  . Drug use: No    Colonoscopy: EGD on 02/27/2019 under Dr. Paulita Fujita found some friable gastric mucosa.  Colonoscopy under Dr. Michail Sermon 01/22/2018 showed significant diverticular disease  PAP:   Bone density:  Mammography: 02/25/2017, breast density category B.  Allergies  Allergen Reactions  . Flagyl [Metronidazole] Rash    ALL-OVER BODY RASH  . Coreg [Carvedilol] Other (See Comments)    Terrible cramping in the feet and had a lot of bowel movements, but not diarrhea  . Losartan Swelling    Patient doesn't recall site of swelling  . Verapamil Hives  . Zetia [Ezetimibe] Other (See Comments)     Reaction not recalled  . Zocor [Simvastatin - High Dose] Other (See Comments)    Reaction not recalled    Current Outpatient Medications  Medication Sig Dispense Refill  . acetaminophen (TYLENOL) 325 MG tablet Take 325 mg by mouth every 6 (six) hours as needed for mild pain.     [name] allopurinol (ZYLOPRIM) 100 MG tablet Take 100 mg by mouth daily.    [name] ALPRAZolam (XANAX) 0.5 MG tablet Take 0.25 mg by mouth at bedtime.     [name] atorvastatin (LIPITOR) 80 MG tablet Take 1 tablet (80 mg total) by mouth daily at 6 PM.    . Cholecalciferol (VITAMIN D3) 1000 UNITS CAPS Take 1,000 Units by mouth daily with lunch.     . Cyanocobalamin (VITAMIN B12) 1000 MCG TBCR Take 1,000 mcg by mouth daily with lunch.     . diphenhydrAMINE-zinc acetate (BENADRYL) cream Apply topically 3 (three) times daily  as needed for itching. 28.4 g 0  . feeding supplement, GLUCERNA SHAKE, (GLUCERNA SHAKE) LIQD Take 237 mLs by mouth 2 (two) times daily between meals. (Patient not taking: Reported on 05/08/2019) 237 mL 30  . ferrous sulfate 325 (65 FE) MG tablet Take 1 tablet (325 mg total) by mouth 2 (two) times daily with a meal. 60 tablet 3  . furosemide (LASIX) 40 MG tablet Take 80 mg by mouth daily.    . hydrALAZINE (APRESOLINE) 100 MG tablet TAKE 1 TABLET (100 MG TOTAL) BY MOUTH 3 (THREE) TIMES DAILY. 270 tablet 1  . irbesartan (AVAPRO) 300 MG tablet Take 300 mg by mouth daily.    . magnesium oxide (MAG-OX) 400 (241.3 Mg) MG tablet Take 1 tablet (400 mg total) by mouth daily.    . metFORMIN (GLUCOPHAGE) 500 MG tablet Take 500 mg by mouth 2 (two) times daily with a meal.     . metoprolol tartrate (LOPRESSOR) 25 MG tablet Take 0.5 tablets (12.5 mg total) by mouth 2 (two) times daily. 60 tablet 0  . ondansetron (ZOFRAN ODT) 4 MG disintegrating tablet Take 1 tablet (4 mg total) by mouth every 8 (eight) hours as needed for nausea or vomiting. 6 tablet 0  . pantoprazole (PROTONIX) 40 MG tablet Take 40 mg by mouth 2 (two) times daily.      . Probiotic Product (PROBIOTIC PO) Take 1 tablet by mouth daily.    [name] spironolactone (ALDACTONE) 25 MG tablet Take 25 mg by mouth daily.    . tadalafil (CIALIS) 20 MG tablet Take 1 tablet (20 mg total) by mouth 2 (two) times daily. 180 tablet 3  . vitamin C (ASCORBIC ACID) 500 MG tablet Take 500 mg by mouth daily with lunch.    . warfarin (COUMADIN) 5 MG tablet Take 5-7.5 mg by mouth daily. Take 1.5 tablets on Tues, Thursday, Saturday and Sunday. Take 1 tablet on Mondays, Wednesdays, and Fridays.     No current facility-administered medications for this visit.      OBJECTIVE: Elderly white woman evaluated in the infusion area  There were no vitals filed for this visit.   There is no height or weight on file to calculate BMI.   Wt Readings from Last 3 Encounters:  05/09/19 119 lb 3.2 oz (54.1 kg)  05/07/19 124 lb 3.2 oz (56.3 kg)  05/05/19 123 lb 8 oz (56 kg)   For labs today see the infusion flowsheet    ECOG FS:1 - Symptomatic but completely ambulatory   LAB RESULTS:  CMP     Component Value Date/Time   NA 132 (L) 05/09/2019 0624   NA 137 12/06/2017 1436   K 3.8 05/09/2019 0624   CL 103 05/09/2019 0624   CO2 20 (L) 05/09/2019 0624   GLUCOSE 98 05/09/2019 0624   BUN 51 (H) 05/09/2019 0624   BUN 20 12/06/2017 1436   CREATININE 1.77 (H) 05/09/2019 0624   CREATININE 1.99 (H) 05/05/2019 1109   CALCIUM 8.6 (L) 05/09/2019 0624   PROT 6.7 05/08/2019 0218   ALBUMIN 2.8 (L) 05/08/2019 0218   AST 26 05/08/2019 0218   AST 20 05/05/2019 1109   ALT 14 05/08/2019 0218   ALT 10 05/05/2019 1109   ALKPHOS 73 05/08/2019 0218   BILITOT 1.1 05/08/2019 0218   BILITOT 0.5 05/05/2019 1109   GFRNONAA 27 (L) 05/09/2019 0624   GFRNONAA 23 (L) 05/05/2019 1109   GFRAA 31 (L) 05/09/2019 0624   GFRAA 27 (L) 05/05/2019 1109  Lab Results  Component Value Date   TOTALPROTELP 6.9 05/07/2019    No results found for: Nils Pyle, [facility]  Lab Results  Component Value  Date   WBC 3.2 (L) 05/12/2019   NEUTROABS 2.3 05/12/2019   HGB 7.2 (L) 05/12/2019   HCT 22.8 (L) 05/12/2019   MCV 92.3 05/12/2019   PLT 169 05/12/2019    @LASTCHEMISTRY @  No results found for: LABCA2  No components found for: [id number]  Recent Labs  Lab 05/09/19 0624  INR 3.1*    No results found for: LABCA2  No results found for: IPJ825  No results found for: KNL976  No results found for: BHA193  No results found for: CA2729  No components found for: HGQUANT  No results found for: CEA1 / No results found for: CEA1   No results found for: AFPTUMOR  No results found for: CHROMOGRNA  No results found for: PSA1  Appointment on 05/12/2019  Component Date Value Ref Range Status  . Smear Review 05/12/2019 SMEAR STAINED AND AVAILABLE FOR REVIEW   Final   Performed at [facility], [address], [place], [place] [address]  . Retic Ct Pct 05/12/2019 3.8* 0.4 - 3.1 % Final  . RBC. 05/12/2019 2.45* 3.87 - 5.11 MIL/uL Final  . Retic Count, Absolute 05/12/2019 92.9  19.0 - 186.0 K/uL Final  . Immature Retic Fract 05/12/2019 22.4* 2.3 - 15.9 % Final   Performed at [facility], [place] [address] [place], [place] [address]  . WBC 05/12/2019 3.2* 4.0 - 10.5 K/uL Final  . RBC 05/12/2019 2.47* 3.87 - 5.11 MIL/uL Final  . Hemoglobin 05/12/2019 7.2* 12.0 - 15.0 g/dL Final  . HCT 05/12/2019 22.8* 36.0 - 46.0 % Final  . MCV 05/12/2019 92.3  80.0 - 100.0 fL Final  . MCH 05/12/2019 29.1  26.0 - 34.0 pg Final  . MCHC 05/12/2019 31.6  30.0 - 36.0 g/dL Final  . RDW 05/12/2019 15.2  11.5 - 15.5 % Final  . Platelets 05/12/2019 169  150 - 400 K/uL Final  . nRBC 05/12/2019 0.0  0.0 - 0.2 % Final  . Neutrophils Relative % 05/12/2019 74  % Final  . Neutro Abs 05/12/2019 2.3  1.7 - 7.7 K/uL Final  . Lymphocytes Relative 05/12/2019 16  % Final  . Lymphs Abs 05/12/2019 0.5* 0.7 - 4.0 K/uL Final  . Monocytes Relative 05/12/2019 5  % Final   . Monocytes Absolute 05/12/2019 0.2  0.1 - 1.0 K/uL Final  . Eosinophils Relative 05/12/2019 4  % Final  . Eosinophils Absolute 05/12/2019 0.1  0.0 - 0.5 K/uL Final  . Basophils Relative 05/12/2019 0  % Final  . Basophils Absolute 05/12/2019 0.0  0.0 - 0.1 K/uL Final  . Immature Granulocytes 05/12/2019 1  % Final  . Abs Immature Granulocytes 05/12/2019 0.02  0.00 - 0.07 K/uL Final   Performed at [facility], [place] [address], [place], [place] [address]    (this displays the last labs from the last 3 days)  Lab Results  Component Value Date   TOTALPROTELP 6.9 05/07/2019   (this displays SPEP labs)  No results found for: KPAFRELGTCHN, LAMBDASER, KAPLAMBRATIO (kappa/lambda light chains)  No results found for: HGBA, HGBA2QUANT, HGBFQUANT, HGBSQUAN (Hemoglobinopathy evaluation)   Lab Results  Component Value Date   LDH 197 (H) 02/27/2019    Lab Results  Component Value Date   IRON 37 (L) 05/05/2019   TIBC 251 05/05/2019  IRONPCTSAT 15 (L) 05/05/2019   (Iron and TIBC)  Lab Results  Component Value Date   FERRITIN 118 05/05/2019    Urinalysis    Component Value Date/Time   COLORURINE YELLOW 05/08/2019 0715   APPEARANCEUR HAZY (A) 05/08/2019 0715   LABSPEC 1.010 05/08/2019 0715   PHURINE 5.0 05/08/2019 0715   GLUCOSEU NEGATIVE 05/08/2019 0715   HGBUR MODERATE (A) 05/08/2019 0715   BILIRUBINUR NEGATIVE 05/08/2019 0715   KETONESUR NEGATIVE 05/08/2019 0715   PROTEINUR 100 (A) 05/08/2019 0715   UROBILINOGEN 1.0 10/06/2011 1709   NITRITE NEGATIVE 05/08/2019 0715   LEUKOCYTESUR TRACE (A) 05/08/2019 0715     STUDIES:  Dg Chest Portable 1 View  Result Date: 05/08/2019 CLINICAL DATA:  Fever EXAM: PORTABLE CHEST 1 VIEW COMPARISON:  04/04/2019, 02/28/2019 FINDINGS: Post sternotomy changes. Cardiomegaly with vascular congestion. Diffuse bilateral interstitial and ground-glass opacity, possible edema. More confluent opacity at the right base may  reflect superimposed pneumonia. No pneumothorax. IMPRESSION: 1. Cardiomegaly with vascular congestion and diffuse bilateral interstitial and ground-glass opacity suggesting pulmonary edema. 2. More confluent airspace disease at the right base, could reflect superimposed pneumonia Electronically Signed   By: Donavan Foil M.D.   On: 05/08/2019 02:25     ELIGIBLE FOR AVAILABLE RESEARCH PROTOCOL: no   ASSESSMENT: 78 y.o. [place] woman with multifactorial anemia, as follows:             (a) anemia of chronic inflammation: As of May 2020 she has a SED rate of 95, CRP 1.1, positive ANA and RF; she has been referred to rheumatology with an appointment pending 03/27/2019 inflammation makes iron stores relatively inaccessible for hematopoiesis and blunts the effect of native EPO             (b) hemolysis (mild): she has a mildly elevated LDH and a DAT positive for complement, not IgG; this is c/w (a) above             (c) anemia of renal insufficiency: inadequate EPO resonse to anemia            (d) occult  (d) GIB/ iron deficiency in patient on lifelong anticoagulaton  (1) Feraheme received 03/01/2019, repeated 03/20/2019   (a) reticulocyte 03/07/2019 up from 43.4 a year ago to 191.8 after iron infusion  (b) additional Feraheme doses given 05/05/2019 and 05/12/2019  (2) darbepoetin: starting 05/05/2019 at 150mcg given every 2 weeks.      PLAN: Halee's ferritin levels have been steadily dropping, although at her most recent admission her sed rate was still greater than 100.  I think she is probably losing iron somewhere, likely an occult bleed worsened by her anticoagulation.  We are going to go ahead and give her a unit of blood today, and we will give her a second dose of Feraheme.  She may need Feraheme on a every 3 to every 6 month basis.  She is on day 3 of a Medrol Dosepak and the rash appears to be receding.  She has no urinary symptoms today.  She received antibiotics during the  hospitalization.  We will repeat a urine culture today.  Otherwise she will see me again next week.  We will likely resume Aranesp at that time  He knows to call for any other issue that may develop before that visit.  Wilber Bihari, NP 05/12/19 1:00 PM Medical Oncology and Hematology [facility] [address] [place], [place] [address] Tel. [phone]    Fax. [phone]

## 2019-05-12 NOTE — Patient Instructions (Signed)
Ferric carboxymaltose injection What is this medicine? FERRIC CARBOXYMALTOSE (ferr-ik car-box-ee-mol-toes) is an iron complex. Iron is used to make healthy red blood cells, which carry oxygen and nutrients throughout the body. This medicine is used to treat anemia in people with chronic kidney disease or people who cannot take iron by mouth. This medicine may be used for other purposes; ask your health care provider or pharmacist if you have questions. COMMON BRAND NAME(S): Injectafer What should I tell my health care provider before I take this medicine? They need to know if you have any of these conditions:  high levels of iron in the blood  liver disease  an unusual or allergic reaction to iron, other medicines, foods, dyes, or preservatives  pregnant or trying to get pregnant  breast-feeding How should I use this medicine? This medicine is for infusion into a vein. It is given by a health care professional in a hospital or clinic setting. Talk to your pediatrician regarding the use of this medicine in children. Special care may be needed. Overdosage: If you think you have taken too much of this medicine contact a poison control center or emergency room at once. NOTE: This medicine is only for you. Do not share this medicine with others. What if I miss a dose? It is important not to miss your dose. Call your doctor or health care professional if you are unable to keep an appointment. What may interact with this medicine? Do not take this medicine with any of the following medications:  deferoxamine  dimercaprol  other iron products This list may not describe all possible interactions. Give your health care provider a list of all the medicines, herbs, non-prescription drugs, or dietary supplements you use. Also tell them if you smoke, drink alcohol, or use illegal drugs. Some items may interact with your medicine. What should I watch for while using this medicine? Visit your  doctor or health care professional regularly. Tell your doctor if your symptoms do not start to get better or if they get worse. You may need blood work done while you are taking this medicine. You may need to follow a special diet. Talk to your doctor. Foods that contain iron include: whole grains/cereals, dried fruits, beans, or peas, leafy green vegetables, and organ meats (liver, kidney). What side effects may I notice from receiving this medicine? Side effects that you should report to your doctor or health care professional as soon as possible:  allergic reactions like skin rash, itching or hives, swelling of the face, lips, or tongue  dizziness  facial flushing Side effects that usually do not require medical attention (report to your doctor or health care professional if they continue or are bothersome):  changes in taste  constipation  headache  nausea, vomiting  pain, redness, or irritation at site where injected This list may not describe all possible side effects. Call your doctor for medical advice about side effects. You may report side effects to FDA at 1-800-FDA-1088. Where should I keep my medicine? This drug is given in a hospital or clinic and will not be stored at home. NOTE: This sheet is a summary. It may not cover all possible information. If you have questions about this medicine, talk to your doctor, pharmacist, or health care provider.  2020 Elsevier/Gold Standard (2016-11-09 09:40:29)   Blood Transfusion, Adult A blood transfusion is a procedure in which you are given blood through an IV tube. You may need this procedure because of:  Illness.  Surgery.  Injury. The blood may come from someone else (a donor). You may also be able to donate blood for yourself (autologous blood donation). The blood given in a transfusion is made up of different types of cells. You may get:  Red blood cells. These carry oxygen to the cells in the body.  White blood  cells. These help you fight infections.  Platelets. These help your blood to clot.  Plasma. This is the liquid part of your blood. It helps with fluid imbalances. If you have a clotting disorder, you may also get other types of blood products. What happens before the procedure?  You will have a blood test to find out your blood type. The test also finds out what type of blood your body will accept and matches it to the donor type.  If you are going to have a planned surgery, you may be able to donate your own blood. This may be done in case you need a transfusion.  If you have had an allergic reaction to a transfusion in the past, you may be given medicine to help prevent a reaction. This medicine may be given to you by mouth or through an IV.  You will have your temperature, blood pressure, and pulse checked.  Follow instructions from your doctor about what you cannot eat or drink.  Ask your doctor about: ? Changing or stopping your regular medicines. This is important if you take diabetes medicines or blood thinners. ? Taking medicines such as aspirin and ibuprofen. These medicines can thin your blood. Do not take these medicines before your procedure if your doctor tells you not to. What happens during the procedure?  An IV tube will be put into one of your veins.  The bag of donated blood will be attached to your IV tube. Then, the blood will enter through your vein.  Your temperature, blood pressure, and pulse will be checked regularly during the procedure. This is done to find early signs of a transfusion reaction.  If you have any signs or symptoms of a reaction, your transfusion will be stopped. You may also be given medicine.  When the transfusion is done, your IV tube will be taken out.  Pressure may be applied to the IV site for a few minutes.  A bandage (dressing) will be put on the IV site. The procedure may vary among doctors and hospitals. What happens after the  procedure?  Your temperature, blood pressure, heart rate, breathing rate, and blood oxygen level will be checked often.  Your blood may be tested to see how you are responding to the transfusion.  You may be warmed with fluids or blankets. This is done to keep the temperature of your body normal. Summary  A blood transfusion is a procedure in which you are given blood through an IV tube.  The blood may come from someone else (a donor). You may also be able to donate blood for yourself.  If you have had an allergic reaction to a transfusion in the past, you may be given medicine to help prevent a reaction. This medicine may be given to you by mouth or through an IV tube.  Your temperature, blood pressure, heart rate, breathing rate, and blood oxygen level will be checked often.  Your blood may be tested to see how you are responding to the transfusion. This information is not intended to replace advice given to you by your health care provider. Make sure you discuss any  questions you have with your health care provider. Document Released: 12/22/2008 Document Revised: 11/15/2016 Document Reviewed: 05/19/2016 Elsevier Patient Education  2020 [employer].

## 2019-05-12 NOTE — Patient Outreach (Addendum)
[facility]) Care Management  [name]   05/12/2019  [name] [birth year] [id number]  Reason for referral: Medication Assistance, Medication Reconciliation Post Discharge  Referral source: [facility] RN Current insurance: [employer]  PMHx includes but not limited to:   CHF, Anemia, Anxiety, Aortic Atherosclerosis, CAD, CHF, CKD stage III, type 2 diabetes, GERD, sp aortic valve replacement, hypertension, Pulmonary hypertension, and  Peripheral arterial disease.  Outreach:  Successful telephone call with patient.  HIPAA identifiers verified.   Subjective:  Patient is an 78 year old female with the medical conditions mentioned above. She visited the ED and was subsequently hospitalized for a rash, chills  and fever. She reported feeling down and fatigued today.  Does the patient ever forget to take medication?  no Does the patient have problems obtaining medications due to transportation?   no Does the patient have problems obtaining medications due to cost?  no  Does the patient feel that medications prescribed are effective?  yes Does the patient ever experience any side effects to the medications prescribed?  no   Objective: The ASCVD Risk score [name] Bussing DC Jr., et al., 2013) failed to calculate for the following reasons:   The 2013 ASCVD risk score is only valid for ages 64 to 35  Lab Results  Component Value Date   CREATININE 1.77 (H) 05/09/2019   CREATININE 1.84 (H) 05/08/2019   CREATININE 2.06 (H) 05/07/2019    Lab Results  Component Value Date   HGBA1C 4.6 (L) 02/24/2019    Lipid Panel     Component Value Date/Time   CHOL 89 02/06/2019 1119   TRIG 184 (H) 02/06/2019 1119   HDL 18 (L) 02/06/2019 1119   CHOLHDL 4.9 02/06/2019 1119   VLDL 37 02/06/2019 1119   LDLCALC 34 02/06/2019 1119    BP Readings from Last 3 Encounters:  05/09/19 (!) 155/53  05/07/19 (!) 150/50  05/05/19 (!) 167/33    Allergies  Allergen Reactions  . Flagyl  [Metronidazole] Rash    ALL-OVER BODY RASH  . Coreg [Carvedilol] Other (See Comments)    Terrible cramping in the feet and had a lot of bowel movements, but not diarrhea  . Losartan Swelling    Patient doesn't recall site of swelling  . Verapamil Hives  . Zetia [Ezetimibe] Other (See Comments)    Reaction not recalled  . Zocor [Simvastatin - High Dose] Other (See Comments)    Reaction not recalled    Medications Reviewed Today    Reviewed by Belva Agee, CPhT (Pharmacy Technician) on 05/08/19 at [place] List Status: Complete  Medication Order Taking? Sig Documenting Provider Last Dose Status Informant  acetaminophen (TYLENOL) 325 MG tablet [id number] Yes Take 325 mg by mouth every 6 (six) hours as needed for mild pain.  [provider] Past Month Unknown time Active Self  allopurinol (ZYLOPRIM) 100 MG tablet [id number] Yes Take 100 mg by mouth daily. [provider] 05/07/2019 Unknown time Active Self  ALPRAZolam (XANAX) 0.5 MG tablet [id number] Yes Take 0.25 mg by mouth at bedtime.  [provider] 05/07/2019 Unknown time Active Self  atorvastatin (LIPITOR) 80 MG tablet [id number] Yes Take 1 tablet (80 mg total) by mouth daily at 6 PM. [name] August, MD 05/07/2019 Unknown time Active Self  Cholecalciferol (VITAMIN D3) 1000 UNITS CAPS [id number] Yes Take 1,000 Units by mouth daily with lunch.  [provider] 05/07/2019 Unknown time Active Self  Cyanocobalamin (VITAMIN B12) 1000 MCG TBCR [id number] Yes Take  1,000 mcg by mouth daily with lunch.  [provider] 05/07/2019 Unknown time Active Self  feeding supplement, GLUCERNA SHAKE, (GLUCERNA SHAKE) LIQD [id number] No Take 237 mLs by mouth 2 (two) times daily between meals.  Patient not taking: Reported on 05/08/2019   [name] Odea, MD Not Taking Unknown time Active Self  ferrous sulfate 325 (65 FE) MG tablet [id number] Yes Take 1 tablet (325 mg total) by mouth 2 (two) times daily with a meal. [name] Odea, MD 05/07/2019 Unknown time Active Self  furosemide (LASIX) 40 MG tablet [id number] Yes Take 80 mg by mouth daily. [provider] 05/07/2019 Unknown time Active Self  hydrALAZINE (APRESOLINE) 100 MG tablet [id number] Yes TAKE 1 TABLET (100 MG TOTAL) BY MOUTH 3 (THREE) TIMES DAILY. [name] Dresser, MD 05/07/2019 Unknown time Active Self  magnesium oxide (MAG-OX) 400 (241.3 Mg) MG tablet [id number] Yes Take 1 tablet (400 mg total) by mouth daily. [name], MD 05/07/2019 Unknown time Active Self  metFORMIN (GLUCOPHAGE) 500 MG tablet [id number] Yes Take 500 mg by mouth 2 (two) times daily with a meal.  [provider] 05/07/2019 Unknown time Active Self  metoprolol tartrate (LOPRESSOR) 25 MG tablet [id number] Yes Take 0.5 tablets (12.5 mg total) by mouth 2 (two) times daily. [place] Eric J, DO 05/07/2019 1800 Active Self  ondansetron (ZOFRAN ODT) 4 MG disintegrating tablet [id number] Yes Take 1 tablet (4 mg total) by mouth every 8 (eight) hours as needed for nausea or vomiting. [name], [name] Hallmark, PA-C Past Month Unknown time Active Self  pantoprazole (PROTONIX) 40 MG tablet [id number] Yes Take 40 mg by mouth 2 (two) times daily.  [provider] 05/07/2019 Unknown time Active Self           Med Note [name]   Wed Jun 20, 2017 10:33 AM)    Probiotic Product (PROBIOTIC PO) [id number] Yes Take 1 tablet by mouth daily. [provider] Past Month Unknown time Active Self  spironolactone (ALDACTONE) 25 MG tablet [id number] Yes Take 25 mg by mouth daily. [provider] 05/07/2019 Unknown time Active Self  tadalafil (CIALIS) 20 MG tablet [id number] Yes Take 1 tablet (20 mg total) by mouth 2 (two) times daily. [name] Dresser, MD 05/07/2019 Unknown time Active Self  vitamin C (ASCORBIC ACID) 500 MG tablet [id number] Yes Take 500 mg by mouth daily with lunch. [provider] 05/07/2019 Unknown time Active Self  warfarin (COUMADIN) 5 MG tablet  [id number] Yes Take 5-7.5 mg by mouth daily. Take 1.5 tablets on Tues, Thursday, Saturday and Sunday. Take 1 tablet on Mondays, Wednesdays, and Fridays. [provider] 05/07/2019 1800 Active Self           ASSESSMENT: Date Discharged from Hospital:  05/09/2019 Date Medication Reconciliation Performed: 05/12/2019  Medications:  New at Discharge: . Diphenhydramine-Zinc Acetate  Adjustments at Discharge: . none  Discontinued at Discharge:   none  Patient was recently discharged from hospital and all medications have been reviewed.   Drugs sorted by system:  Neurologic/Psychologic: Alprazolam  Cardiovascular:  Atorvastatin, Furosemide, Irbesartan, Metoprolol Tartrate, Spironolactone, Warfarin  Gastrointestinal: Glucerna (feeding supplement), Ondansetron, Pantoprazole, Probiotic,   Endocrine: Metformin  Renal: Allopurinol  Topical: Diphenhydramine, zinc-acetate  Pain: Acetaminophen  Vitamins/Minerals/Supplements: Cholecalciferol, Cyanocobalamin, Ferrous Sulfate, Magnesium Oxide, Vitamin C  Miscellaneous: Tadalafil    Patient Outreach Telephone from 05/12/2019 in [place]  PHQ-9 Total Score  9     Patient said she has an appointment at the infusion center today.  Plan: . Will follow-up in 1-2 weeks..  . Route note to PCP   [name], PharmD, [place] Clinical Pharmacist [phone]

## 2019-05-13 LAB — URINE CULTURE: Culture: NO GROWTH

## 2019-05-13 LAB — TYPE AND SCREEN
ABO/RH(D): A POS
Antibody Screen: NEGATIVE
Unit division: 0

## 2019-05-13 LAB — BPAM RBC
Blood Product Expiration Date: 202008252359
ISSUE DATE / TIME: 202008031459
Unit Type and Rh: 6200

## 2019-05-13 LAB — CULTURE, BLOOD (ROUTINE X 2)
Culture: NO GROWTH
Culture: NO GROWTH
Special Requests: ADEQUATE
Special Requests: ADEQUATE

## 2019-05-14 ENCOUNTER — Telehealth (HOSPITAL_COMMUNITY): Payer: Self-pay | Admitting: Pharmacy Technician

## 2019-05-14 DIAGNOSIS — M359 Systemic involvement of connective tissue, unspecified: Secondary | ICD-10-CM | POA: Insufficient documentation

## 2019-05-14 DIAGNOSIS — A770 Spotted fever due to Rickettsia rickettsii: Secondary | ICD-10-CM | POA: Diagnosis not present

## 2019-05-14 DIAGNOSIS — D500 Iron deficiency anemia secondary to blood loss (chronic): Secondary | ICD-10-CM | POA: Diagnosis not present

## 2019-05-14 DIAGNOSIS — R5381 Other malaise: Secondary | ICD-10-CM | POA: Diagnosis not present

## 2019-05-14 NOTE — Telephone Encounter (Signed)
Advanced Heart Failure Patient Advocate Encounter  Received notification from [facility] that prior authorization for Tadalafil 20mg  is required.  PA submitted on CoverMyMeds Key [id number] Status is pending  Charlann Boxer, CPhT   Patient stated that the [facility] was [facility]. Routing to provider for this prescription to be sent to [facility].

## 2019-05-14 NOTE — Telephone Encounter (Signed)
Please get this prescription to the right place.

## 2019-05-15 ENCOUNTER — Other Ambulatory Visit (HOSPITAL_COMMUNITY): Payer: Self-pay

## 2019-05-15 ENCOUNTER — Telehealth (HOSPITAL_COMMUNITY): Payer: Self-pay

## 2019-05-15 ENCOUNTER — Other Ambulatory Visit: Payer: Self-pay

## 2019-05-15 ENCOUNTER — Ambulatory Visit (INDEPENDENT_AMBULATORY_CARE_PROVIDER_SITE_OTHER): Payer: Medicare HMO | Admitting: Pharmacist

## 2019-05-15 DIAGNOSIS — Z952 Presence of prosthetic heart valve: Secondary | ICD-10-CM

## 2019-05-15 DIAGNOSIS — I4891 Unspecified atrial fibrillation: Secondary | ICD-10-CM

## 2019-05-15 DIAGNOSIS — Z7901 Long term (current) use of anticoagulants: Secondary | ICD-10-CM

## 2019-05-15 LAB — POCT INR: INR: 2.7 (ref 2.0–3.0)

## 2019-05-15 MED ORDER — TADALAFIL 20 MG PO TABS: 20.0000 mg | ORAL_TABLET | Freq: Two times a day (BID) | ORAL | 3 refills | Status: DC

## 2019-05-15 MED ORDER — TADALAFIL (PAH) 20 MG PO TABS: 40.0000 mg | ORAL_TABLET | Freq: Every day | ORAL | 6 refills | Status: DC

## 2019-05-15 NOTE — Telephone Encounter (Signed)
Changing from cialis to [place] for insurance approval.

## 2019-05-15 NOTE — Progress Notes (Signed)
I was called into infusion on 05/12/2019 ~5:45 p.m. to evaluate this patient for hypertension.The patient is prescribed hydralazine 100 mg TID. The patient had missed her afternoon dose of hydralazine and was approaching the time to take her evening dose when I was called into the infusion room. The patient had just finished receiving pRBCs when her blood pressure was noted to be 186/46. The patient was asymptomatic and denied any symptoms including chest discomfort, headaches, dizziness, light headedness, or visual changes. The patient denies any history of CVAs. The patient was instructed to take her prescribed 100 mg of hydralazine as soon as she returns home. She mentioned that she has a blood pressure cuff at home and was instructed to monitor her blood pressure. In the future, the patient was advised to bring her medications with her to the clinic so she does not miss any doses. The patient was advised to seek emergency evaluation if she develops any new or worsening symptoms. All questions were answered.

## 2019-05-15 NOTE — Telephone Encounter (Signed)
Tadalafil refill sent to [facility]

## 2019-05-16 ENCOUNTER — Other Ambulatory Visit: Payer: Self-pay | Admitting: *Deleted

## 2019-05-16 LAB — LYME DISEASE DNA BY PCR(BORRELIA BURG): Lyme Disease(B.burgdorferi)PCR: NEGATIVE

## 2019-05-16 NOTE — Patient Outreach (Signed)
[facility]) Care Management  05/16/2019  ABELLA SHUGART [birth year] [id number]   Unsuccessful outreach attempt #2.  Call placed to member to follow up on recent discharge, no answer.  HIPAA compliant voice message left.  Unsuccessful outreach letter sent, will follow up within the next 3-4 business days.  [name], [place], MSN [place] [phone]

## 2019-05-19 ENCOUNTER — Other Ambulatory Visit: Payer: Self-pay

## 2019-05-19 ENCOUNTER — Inpatient Hospital Stay: Payer: Medicare HMO

## 2019-05-19 ENCOUNTER — Inpatient Hospital Stay (HOSPITAL_BASED_OUTPATIENT_CLINIC_OR_DEPARTMENT_OTHER): Payer: Medicare HMO | Admitting: Oncology

## 2019-05-19 VITALS — BP 174/61 | HR 73 | Temp 99.2°F | Resp 18 | Ht 62.0 in | Wt 123.6 lb

## 2019-05-19 DIAGNOSIS — I119 Hypertensive heart disease without heart failure: Secondary | ICD-10-CM | POA: Diagnosis not present

## 2019-05-19 DIAGNOSIS — I2510 Atherosclerotic heart disease of native coronary artery without angina pectoris: Secondary | ICD-10-CM | POA: Diagnosis not present

## 2019-05-19 DIAGNOSIS — D696 Thrombocytopenia, unspecified: Secondary | ICD-10-CM

## 2019-05-19 DIAGNOSIS — N189 Chronic kidney disease, unspecified: Secondary | ICD-10-CM | POA: Diagnosis not present

## 2019-05-19 DIAGNOSIS — I739 Peripheral vascular disease, unspecified: Secondary | ICD-10-CM

## 2019-05-19 DIAGNOSIS — I4891 Unspecified atrial fibrillation: Secondary | ICD-10-CM | POA: Diagnosis not present

## 2019-05-19 DIAGNOSIS — D500 Iron deficiency anemia secondary to blood loss (chronic): Secondary | ICD-10-CM

## 2019-05-19 DIAGNOSIS — D631 Anemia in chronic kidney disease: Secondary | ICD-10-CM | POA: Diagnosis not present

## 2019-05-19 DIAGNOSIS — E785 Hyperlipidemia, unspecified: Secondary | ICD-10-CM | POA: Diagnosis not present

## 2019-05-19 DIAGNOSIS — Z87440 Personal history of urinary (tract) infections: Secondary | ICD-10-CM | POA: Diagnosis not present

## 2019-05-19 DIAGNOSIS — Z5112 Encounter for antineoplastic immunotherapy: Secondary | ICD-10-CM | POA: Diagnosis not present

## 2019-05-19 DIAGNOSIS — D594 Other nonautoimmune hemolytic anemias: Secondary | ICD-10-CM

## 2019-05-19 DIAGNOSIS — D649 Anemia, unspecified: Secondary | ICD-10-CM

## 2019-05-19 DIAGNOSIS — D508 Other iron deficiency anemias: Secondary | ICD-10-CM

## 2019-05-19 LAB — CBC WITH DIFFERENTIAL/PLATELET
Abs Immature Granulocytes: 0.02 10*3/uL (ref 0.00–0.07)
Basophils Absolute: 0 10*3/uL (ref 0.0–0.1)
Basophils Relative: 1 %
Eosinophils Absolute: 0.2 10*3/uL (ref 0.0–0.5)
Eosinophils Relative: 4 %
HCT: 24 % — ABNORMAL LOW (ref 36.0–46.0)
Hemoglobin: 7.6 g/dL — ABNORMAL LOW (ref 12.0–15.0)
Immature Granulocytes: 1 %
Lymphocytes Relative: 10 %
Lymphs Abs: 0.4 10*3/uL — ABNORMAL LOW (ref 0.7–4.0)
MCH: 30.3 pg (ref 26.0–34.0)
MCHC: 31.7 g/dL (ref 30.0–36.0)
MCV: 95.6 fL (ref 80.0–100.0)
Monocytes Absolute: 0.1 10*3/uL (ref 0.1–1.0)
Monocytes Relative: 4 %
Neutro Abs: 2.8 10*3/uL (ref 1.7–7.7)
Neutrophils Relative %: 80 %
Platelets: 150 10*3/uL (ref 150–400)
RBC: 2.51 MIL/uL — ABNORMAL LOW (ref 3.87–5.11)
RDW: 18.5 % — ABNORMAL HIGH (ref 11.5–15.5)
WBC: 3.5 10*3/uL — ABNORMAL LOW (ref 4.0–10.5)
nRBC: 0 % (ref 0.0–0.2)

## 2019-05-19 LAB — TYPE AND SCREEN
ABO/RH(D): A POS
Antibody Screen: NEGATIVE

## 2019-05-19 LAB — FERRITIN: Ferritin: 466 ng/mL — ABNORMAL HIGH (ref 11–307)

## 2019-05-19 LAB — SAVE SMEAR(SSMR), FOR PROVIDER SLIDE REVIEW

## 2019-05-19 LAB — RETICULOCYTES
Immature Retic Fract: 12.1 % (ref 2.3–15.9)
RBC.: 2.4 MIL/uL — ABNORMAL LOW (ref 3.87–5.11)
Retic Count, Absolute: 208.5 10*3/uL — ABNORMAL HIGH (ref 19.0–186.0)
Retic Ct Pct: 8.7 % — ABNORMAL HIGH (ref 0.4–3.1)

## 2019-05-19 MED ORDER — DARBEPOETIN ALFA 200 MCG/0.4ML IJ SOSY
PREFILLED_SYRINGE | INTRAMUSCULAR | Status: AC
  Filled 2019-05-19: qty 0.4

## 2019-05-19 MED ORDER — DARBEPOETIN ALFA 200 MCG/0.4ML IJ SOSY: 150.0000 ug | PREFILLED_SYRINGE | Freq: Once | INTRAMUSCULAR | Status: DC

## 2019-05-19 MED ORDER — PREDNISONE 5 MG PO TABS: 10.0000 mg | ORAL_TABLET | Freq: Every day | ORAL | 2 refills | Status: DC

## 2019-05-19 MED ORDER — DARBEPOETIN ALFA 150 MCG/0.3ML IJ SOSY
150.0000 ug | PREFILLED_SYRINGE | Freq: Once | INTRAMUSCULAR | Status: AC
  Administered 2019-05-19: 150 ug via SUBCUTANEOUS
  Filled 2019-05-19: qty 0.3

## 2019-05-19 NOTE — Progress Notes (Signed)
[name] is good to go per Darlena.   Dr. Jana Hakim is okay to give [name] today in setting of elevated blood pressure.   [name], PharmD, [place] [facility] Pharmacist Pharmacy Phone: [phone] 05/19/2019

## 2019-05-19 NOTE — Progress Notes (Signed)
[place]  Telephone:([phone]) [phone] Fax:(336) [phone]    ID: ANILAH HUCK DOB: 1940/12/06  MR#: [id number]  QMV#:[id number]  Patient Care Team: [name], MD as PCP - General (Internal Medicine) [name], MD as PCP - Advanced Heart Failure (Cardiology) Lorretta Harp, MD as PCP - Cardiology (Cardiology) [name], RN as [place] Management [place] Ament, [name], MD as Consulting Physician (Hematology and Oncology) Arta Silence, MD as Consulting Physician (Gastroenterology) [name], MD as Consulting Physician (Cardiology) Marzetta Board, DPM as Consulting Physician (Podiatry) Gavin Pound, MD as Consulting Physician (Rheumatology) [name], [facility] as [place] Management (Pharmacist) OTHER MD:   CHIEF COMPLAINT: anemia and thrombocytopenia  CURRENT TREATMENT: Feraheme, Aranesp   INTERVAL HISTORY: Aleigha returns today for follow-up and treatment of her anemia and thrombocytopenia associated with chronic inflammatory disease.   She continues on feraheme, her most recent treatment being 05/12/2019.  She also continues on aranesp.  She received her first dose on 05/05/2019. She tolerates this well.  After her last treatment here she developed a rash which took her to the hospital for a brief admission.  She was found to have a urinary tract infection and was treated for that.  We repeated the culture last visit and that was negative.   REVIEW OF SYSTEMS: Margean notes that she stays fatigued. She notes that she has been having a hard time eating due to nausea without help from zofran. The patient denies unusual headaches, visual changes, vomiting, or dizziness. There has been no unusual cough, phlegm production, or pleurisy. This been no change in bowel or bladder habits. The patient denies unexplained weight loss, bleeding, rash, or fever. A detailed review of systems was otherwise  noncontributory.    HISTORY OF CURRENT ILLNESS: From the original consult note:  [name] is an 78 y.o. female from [place], [place].  She has a past medical history significant forPAD s/p stenting; moderate to severe pulmonary HTN; afib and AVR on Coumadin; HTN; HLD; DM; and CAD s/p CABG. the patient was admitted to the hospital for anemia.  She was having increasing fatigue and shortness of breath and had routine lab work drawn which showed a hemoglobin of 6.6.  Due to her abnormal lab work, she was advised to come to the emergency room for admission.  She was also admitted in February 2020 for symptomatic anemia.  She reports she has been anemic for at least a year and has been taking oral iron.  She underwent a colonoscopy and EGD on 01/22/2018.  The colonoscopy showed diverticulosis in the sigmoid colon and descending colon, internal hemorrhoids.  The upper endoscopy was normal except for erythematous mucosa in the antrum.  She had a repeat EGD on 11/21/2018 that showed a few minimally oozing( with scope trauma) superficial gastric ulcers with pigmented material were found in the gastric body. The largest lesion was 4 mm in largest dimension.  She underwent another upper endoscopy on 02/27/2019 which showed patchy mildly friable mucosa with contact bleeding was found in the gastric fundus, in the gastric body and in the gastric antrum.  The patient is maintained on warfarin due to A. fib and a mechanical aortic valve.  Review of the patient's chart shows that she has been anemic dating back to at least December thousand 12 (these are the earliest labs available to me).  Her highest hemoglobin was 11.6 in December 2012.  I also note that  the patient has had intermittent mild leukopenia adding back to at least April 2019.  Her lowest white blood cell count was 2.6 in April 2019.  Since admission, the patient has received 1 unit of packed red blood cells.  She is currently on heparin for her  procedure with plan to bridge back to Coumadin.  Stool for occult blood was negative x1 on 02/24/2019.  Hematology was asked see the patient to make recommendations regarding her anemia.  [name] subsequent history is as detailed below.   PAST MEDICAL HISTORY: Past Medical History:  Diagnosis Date   Aortic atherosclerosis ([place]) 01/23/2018   Atrial fibrillation (HCC)    Carotid artery disease ([place])    Cholelithiasis 01/23/2018   Chronic anticoagulation    Coronary artery disease    status post coronary artery bypass grafting times 07/10/2004   Diabetes mellitus    Hypercholesteremia    Hypertension    Mechanical heart valve present    H. aortic valve replacement at the time of bypass surgery October 2005   Moderate to severe pulmonary hypertension ([place])    Peripheral arterial disease ([place])    history of left common iliac artery PTA and stenting for a chronic total occlusion 08/26/01     PAST SURGICAL HISTORY: Past Surgical History:  Procedure Laterality Date   AORTIC VALVE REPLACEMENT     CARDIAC CATHETERIZATION  11/10/2004   40% right common illiac, 70% in stent restenosis of distal left common illiac,    CARDIAC CATHETERIZATION  05/18/2004   LAD 50-70% midstenosis, RCA dominant w/50% stenosis, 50% Right common Illiac artery ostial stenosis, 90% in stent restenosis within midportion of left common illiac stent   Carotid Duplex  03/12/2012   RSA-elev. velocities suggestive of a 50-69% diameter reduction, Right&Left Bulb/Prox ICA-mild-mod.fibrous plaqueelevating Velocities abnormal study.   CHOLECYSTECTOMY N/A 03/01/2018   Procedure: LAPAROSCOPIC CHOLECYSTECTOMY;  Surgeon: Judeth Horn, MD;  Location: [place];  Service: General;  Laterality: N/A;   COLONOSCOPY WITH PROPOFOL N/A 01/22/2018   Procedure: COLONOSCOPY WITH PROPOFOL;  Surgeon: Wilford Corner, MD;  Location: [place];  Service: Endoscopy;  Laterality: N/A;   ESOPHAGOGASTRODUODENOSCOPY (EGD)  WITH PROPOFOL N/A 01/22/2018   Procedure: ESOPHAGOGASTRODUODENOSCOPY (EGD) WITH PROPOFOL;  Surgeon: Wilford Corner, MD;  Location: [place];  Service: Endoscopy;  Laterality: N/A;   ESOPHAGOGASTRODUODENOSCOPY (EGD) WITH PROPOFOL N/A 11/21/2018   Procedure: ESOPHAGOGASTRODUODENOSCOPY (EGD) WITH PROPOFOL;  Surgeon: [name], MD;  Location: [place];  Service: Gastroenterology;  Laterality: N/A;   ESOPHAGOGASTRODUODENOSCOPY (EGD) WITH PROPOFOL Left 02/27/2019   Procedure: ESOPHAGOGASTRODUODENOSCOPY (EGD) WITH PROPOFOL;  Surgeon: Arta Silence, MD;  Location: [facility];  Service: Endoscopy;  Laterality: Left;   GIVENS CAPSULE STUDY N/A 11/21/2018   Procedure: GIVENS CAPSULE STUDY;  Surgeon: [name], MD;  Location: [place];  Service: Gastroenterology;  Laterality: N/A;  To be deployed during EGD   Lower Ext. Duplex  03/12/2012   Right Proximal CIA- vessel narrowing w/elevated velocities 0-49% diameter reduction. Right SFA-mild mixed density plaque throughout vessel.   NM MYOCAR PERF WALL MOTION  05/19/2010   protocol: Persantine, post stress EF 65%, negative for ischemia, low risk scan   RIGHT HEART CATH N/A 06/27/2018   Procedure: RIGHT HEART CATH;  Surgeon: [name], MD;  Location: [place] [facility];  Service: Cardiovascular;  Laterality: N/A;   TRANSTHORACIC ECHOCARDIOGRAM  08/29/2012   Moderately calcified annulus of mitral valve, moderate regurg. of both mitral valve and tricuspid valve.      FAMILY HISTORY: Family History  Problem Relation Age of Onset   Breast cancer Neg Hx     GYNECOLOGIC HISTORY:  No LMP recorded. Patient has had a hysterectomy. Menarche:  years old Age at first live birth:  [place] P: 3 LMP:  Contraceptive:  HRT:   Hysterectomy?: yes BSO?:    SOCIAL HISTORY: (Current as of 03/07/2019) Julliana is widowed.  She lives alone here in [place], [place].  She has 3 children that live locally.  She smoked three quarters of  a pack of cigarettes daily for 50 years.  She quit in 2005.  Denies alcohol use.   ADVANCED DIRECTIVES: Not in place    HEALTH MAINTENANCE: Social History   Tobacco Use   Smoking status: Former Smoker    Packs/day: 1.00    Years: 30.00    Pack years: 30.00    Types: Cigarettes   Smokeless tobacco: Never Used   Tobacco comment: quit smoking 2005  Substance Use Topics   Alcohol use: No    Alcohol/week: 0.0 standard drinks   Drug use: No    Colonoscopy: EGD on 02/27/2019 under Dr. Paulita Fujita found some friable gastric mucosa.  Colonoscopy under Dr. Michail Sermon 01/22/2018 showed significant diverticular disease  PAP:   Bone density:  Mammography: 02/25/2017, breast density category B.  Allergies  Allergen Reactions   Flagyl [Metronidazole] Rash    ALL-OVER BODY RASH   Coreg [Carvedilol] Other (See Comments)    Terrible cramping in the feet and had a lot of bowel movements, but not diarrhea   Losartan Swelling    Patient doesn't recall site of swelling   Verapamil Hives   Zetia [Ezetimibe] Other (See Comments)    Reaction not recalled   Zocor [Simvastatin - High Dose] Other (See Comments)    Reaction not recalled    Current Outpatient Medications  Medication Sig Dispense Refill   acetaminophen (TYLENOL) 325 MG tablet Take 325 mg by mouth every 6 (six) hours as needed for mild pain.      allopurinol (ZYLOPRIM) 100 MG tablet Take 100 mg by mouth daily.     ALPRAZolam (XANAX) 0.5 MG tablet Take 0.25 mg by mouth at bedtime.      atorvastatin (LIPITOR) 80 MG tablet Take 1 tablet (80 mg total) by mouth daily at 6 PM.     Cholecalciferol (VITAMIN D3) 1000 UNITS CAPS Take 1,000 Units by mouth daily with lunch.      Cyanocobalamin (VITAMIN B12) 1000 MCG TBCR Take 1,000 mcg by mouth daily with lunch.      diphenhydrAMINE-zinc acetate (BENADRYL) cream Apply topically 3 (three) times daily as needed for itching. 28.4 g 0   feeding supplement, GLUCERNA SHAKE, (GLUCERNA  SHAKE) LIQD Take 237 mLs by mouth 2 (two) times daily between meals. (Patient not taking: Reported on 05/08/2019) 237 mL 30   ferrous sulfate 325 (65 FE) MG tablet Take 1 tablet (325 mg total) by mouth 2 (two) times daily with a meal. 60 tablet 3   furosemide (LASIX) 40 MG tablet Take 80 mg by mouth daily.     hydrALAZINE (APRESOLINE) 100 MG tablet TAKE 1 TABLET (100 MG TOTAL) BY MOUTH 3 (THREE) TIMES DAILY. 270 tablet 1   irbesartan (AVAPRO) 300 MG tablet Take 300 mg by mouth daily.     magnesium oxide (MAG-OX) 400 (241.3 Mg) MG tablet Take 1 tablet (400 mg total) by mouth daily.     metFORMIN (GLUCOPHAGE) 500 MG tablet Take 500 mg by mouth 2 (two) times daily with a meal.  metoprolol tartrate (LOPRESSOR) 25 MG tablet Take 0.5 tablets (12.5 mg total) by mouth 2 (two) times daily. 60 tablet 0   ondansetron (ZOFRAN ODT) 4 MG disintegrating tablet Take 1 tablet (4 mg total) by mouth every 8 (eight) hours as needed for nausea or vomiting. 6 tablet 0   pantoprazole (PROTONIX) 40 MG tablet Take 40 mg by mouth 2 (two) times daily.      Probiotic Product (PROBIOTIC PO) Take 1 tablet by mouth daily.     spironolactone (ALDACTONE) 25 MG tablet Take 25 mg by mouth daily.     tadalafil, PAH, (ADCIRCA) 20 MG tablet Take 2 tablets (40 mg total) by mouth daily. 60 tablet 6   vitamin C (ASCORBIC ACID) 500 MG tablet Take 500 mg by mouth daily with lunch.     warfarin (COUMADIN) 5 MG tablet Take 5-7.5 mg by mouth daily. Take 1.5 tablets on Tues, Thursday, Saturday and Sunday. Take 1 tablet on Mondays, Wednesdays, and Fridays.     No current facility-administered medications for this visit.      OBJECTIVE: Elderly white woman examined in a wheelchair  Vitals:   05/19/19 1215  BP: (!) 174/61  Pulse: 73  Resp: 18  Temp: 99.2 F (37.3 C)     Body mass index is 22.61 kg/m.   Wt Readings from Last 3 Encounters:  05/19/19 123 lb 9.6 oz (56.1 kg)  05/09/19 119 lb 3.2 oz (54.1 kg)  05/07/19  124 lb 3.2 oz (56.3 kg)      ECOG FS:1 - Symptomatic but completely ambulatory  Sclerae unicteric, EOMs intact Wearing a mask No cervical or supraclavicular adenopathy Lungs no rales or rhonchi Heart regular rate and rhythm Abd soft, nontender, positive bowel sounds MSK no focal spinal tenderness, no upper extremity lymphedema Neuro: nonfocal, well oriented, appropriate affect Breasts: Deferred   LAB RESULTS:  CMP     Component Value Date/Time   NA 131 (L) 05/12/2019 1215   NA 137 12/06/2017 1436   K 3.9 05/12/2019 1215   CL 104 05/12/2019 1215   CO2 17 (L) 05/12/2019 1215   GLUCOSE 175 (H) 05/12/2019 1215   BUN 69 (H) 05/12/2019 1215   BUN 20 12/06/2017 1436   CREATININE 1.81 (H) 05/12/2019 1215   CALCIUM 8.8 (L) 05/12/2019 1215   PROT 7.0 05/12/2019 1215   ALBUMIN 2.9 (L) 05/12/2019 1215   AST 20 05/12/2019 1215   ALT 12 05/12/2019 1215   ALKPHOS 72 05/12/2019 1215   BILITOT 0.7 05/12/2019 1215   GFRNONAA 26 (L) 05/12/2019 1215   GFRAA 30 (L) 05/12/2019 1215    Lab Results  Component Value Date   TOTALPROTELP 6.9 05/07/2019    No results found for: KPAFRELGTCHN, LAMBDASER, KAPLAMBRATIO  Lab Results  Component Value Date   WBC 3.5 (L) 05/19/2019   NEUTROABS 2.8 05/19/2019   HGB 7.6 (L) 05/19/2019   HCT 24.0 (L) 05/19/2019   MCV 95.6 05/19/2019   PLT 150 05/19/2019    @LASTCHEMISTRY @  No results found for: LABCA2  No components found for: HYIFOY774  Recent Labs  Lab 05/15/19 1142  INR 2.7    No results found for: LABCA2  No results found for: JOI786  No results found for: VEH209  No results found for: OBS962  No results found for: CA2729  No components found for: HGQUANT  No results found for: CEA1 / No results found for: CEA1   No results found for: AFPTUMOR  No results found for: CHROMOGRNA  No  results found for: PSA1  Appointment on 05/19/2019  Component Date Value Ref Range Status   Smear Review 05/19/2019 SMEAR STAINED  AND AVAILABLE FOR REVIEW   Final   Performed at [facility], [address], [place], [place] [address]   WBC 05/19/2019 3.5* 4.0 - 10.5 K/uL Final   RBC 05/19/2019 2.51* 3.87 - 5.11 MIL/uL Final   Hemoglobin 05/19/2019 7.6* 12.0 - 15.0 g/dL Final   HCT 05/19/2019 24.0* 36.0 - 46.0 % Final   MCV 05/19/2019 95.6  80.0 - 100.0 fL Final   MCH 05/19/2019 30.3  26.0 - 34.0 pg Final   MCHC 05/19/2019 31.7  30.0 - 36.0 g/dL Final   RDW 05/19/2019 18.5* 11.5 - 15.5 % Final   Platelets 05/19/2019 150  150 - 400 K/uL Final   nRBC 05/19/2019 0.0  0.0 - 0.2 % Final   Neutrophils Relative % 05/19/2019 80  % Final   Neutro Abs 05/19/2019 2.8  1.7 - 7.7 K/uL Final   Lymphocytes Relative 05/19/2019 10  % Final   Lymphs Abs 05/19/2019 0.4* 0.7 - 4.0 K/uL Final   Monocytes Relative 05/19/2019 4  % Final   Monocytes Absolute 05/19/2019 0.1  0.1 - 1.0 K/uL Final   Eosinophils Relative 05/19/2019 4  % Final   Eosinophils Absolute 05/19/2019 0.2  0.0 - 0.5 K/uL Final   Basophils Relative 05/19/2019 1  % Final   Basophils Absolute 05/19/2019 0.0  0.0 - 0.1 K/uL Final   Immature Granulocytes 05/19/2019 1  % Final   Abs Immature Granulocytes 05/19/2019 0.02  0.00 - 0.07 K/uL Final   Performed at [facility], [place] [address] [place], [place] [address]    (this displays the last labs from the last 3 days)  Lab Results  Component Value Date   TOTALPROTELP 6.9 05/07/2019   (this displays SPEP labs)  No results found for: KPAFRELGTCHN, LAMBDASER, KAPLAMBRATIO (kappa/lambda light chains)  No results found for: HGBA, HGBA2QUANT, HGBFQUANT, HGBSQUAN (Hemoglobinopathy evaluation)   Lab Results  Component Value Date   LDH 197 (H) 02/27/2019    Lab Results  Component Value Date   IRON 47 05/12/2019   TIBC 263 05/12/2019   IRONPCTSAT 18 (L) 05/12/2019   (Iron and TIBC)  Lab Results  Component Value Date   FERRITIN 197  05/12/2019    Urinalysis    Component Value Date/Time   COLORURINE YELLOW 05/08/2019 0715   APPEARANCEUR HAZY (A) 05/08/2019 0715   LABSPEC 1.010 05/08/2019 0715   PHURINE 5.0 05/08/2019 0715   GLUCOSEU NEGATIVE 05/08/2019 0715   HGBUR MODERATE (A) 05/08/2019 0715   BILIRUBINUR NEGATIVE 05/08/2019 0715   KETONESUR NEGATIVE 05/08/2019 0715   PROTEINUR 100 (A) 05/08/2019 0715   UROBILINOGEN 1.0 10/06/2011 1709   NITRITE NEGATIVE 05/08/2019 0715   LEUKOCYTESUR TRACE (A) 05/08/2019 0715     STUDIES:  Dg Chest Portable 1 View  Result Date: 05/08/2019 CLINICAL DATA:  Fever EXAM: PORTABLE CHEST 1 VIEW COMPARISON:  04/04/2019, 02/28/2019 FINDINGS: Post sternotomy changes. Cardiomegaly with vascular congestion. Diffuse bilateral interstitial and ground-glass opacity, possible edema. More confluent opacity at the right base may reflect superimposed pneumonia. No pneumothorax. IMPRESSION: 1. Cardiomegaly with vascular congestion and diffuse bilateral interstitial and ground-glass opacity suggesting pulmonary edema. 2. More confluent airspace disease at the right base, could reflect superimposed pneumonia Electronically Signed   By: Donavan Foil M.D.   On: 05/08/2019 02:25     ELIGIBLE FOR AVAILABLE RESEARCH PROTOCOL: no  ASSESSMENT: 78 y.o. [place] woman with multifactorial anemia, as follows:             (a) anemia of chronic inflammation: As of May 2020 she has a SED rate of 95, CRP 1.1, positive ANA and RF; she has been referred to rheumatology with an appointment pending 03/27/2019 inflammation makes iron stores relatively inaccessible for hematopoiesis and blunts the effect of native EPO             (b) hemolysis (mild): she has a mildly elevated LDH and a DAT positive for complement, not IgG; this is c/w (a) above             (c) anemia of renal insufficiency: inadequate EPO resonse to anemia            (d) occult  (d) GIB/ iron deficiency in patient on lifelong  anticoagulaton  (1) Feraheme received 03/01/2019, to be repeated 03/20/2019   (a) reticulocyte 03/07/2019 up from 43.4 a year ago to 191.8 after iron infusion  (2) darbepoetin: starting 05/05/2019 at 137mcg given every 2 weeks.      PLAN: Ajna no longer has a rash.  I do not think it really was related to either the [facility] or the iron nests she received.  She is due for her second Aranesp dose today.  Her hemoglobins remains low, although it is more than 7 and she has no shortness of breath chest pain or pressure.  She is indeed very tired.  I am not sure transfusing her at this point would really help very much  We are checking a ferritin today to make sure her iron has completely recovered.  That should have given the iron she has received  I am starting her on prednisone 10 mg daily.  We discussed how this may affect her A1c and what precautions she needs to take regarding that.  She will return in 2 weeks for a shot and we will make sure to see her again at that time.  She requested a prescription for a lift chair and I was glad to provide that for her  Kolson Chovanec, [name], MD  05/19/19 12:47 PM Medical Oncology and Hematology [facility] [address] [place], [place] [address] Tel. [phone]    Fax. [phone]  I, Jacqualyn Posey am acting as a Education administrator for Chauncey Cruel, MD.   I, Lurline Del MD, have reviewed the above documentation for accuracy and completeness, and I agree with the above.

## 2019-05-20 ENCOUNTER — Telehealth: Payer: Self-pay | Admitting: Oncology

## 2019-05-20 NOTE — Telephone Encounter (Signed)
I talk with patient regarding schedule  

## 2019-05-21 ENCOUNTER — Ambulatory Visit (HOSPITAL_COMMUNITY)
Admission: RE | Admit: 2019-05-21 | Discharge: 2019-05-21 | Disposition: A | Discharge status: Home / Self Care | Payer: Medicare HMO | Source: Ambulatory Visit | Attending: Internal Medicine | Admitting: Internal Medicine

## 2019-05-21 ENCOUNTER — Other Ambulatory Visit: Payer: Self-pay

## 2019-05-21 ENCOUNTER — Other Ambulatory Visit: Payer: Self-pay | Admitting: Cardiovascular Disease

## 2019-05-21 DIAGNOSIS — I5032 Chronic diastolic (congestive) heart failure: Secondary | ICD-10-CM | POA: Diagnosis not present

## 2019-05-21 LAB — BASIC METABOLIC PANEL
Anion gap: 11 (ref 5–15)
BUN: 56 mg/dL — ABNORMAL HIGH (ref 8–23)
CO2: 18 mmol/L — ABNORMAL LOW (ref 22–32)
Calcium: 8.5 mg/dL — ABNORMAL LOW (ref 8.9–10.3)
Chloride: 104 mmol/L (ref 98–111)
Creatinine, Ser: 2.25 mg/dL — ABNORMAL HIGH (ref 0.44–1.00)
GFR calc Af Amer: 23 mL/min — ABNORMAL LOW (ref >60)
GFR calc non Af Amer: 20 mL/min — ABNORMAL LOW (ref >60)
Glucose, Bld: 146 mg/dL — ABNORMAL HIGH (ref 70–99)
Potassium: 4 mmol/L (ref 3.5–5.1)
Sodium: 133 mmol/L — ABNORMAL LOW (ref 135–145)

## 2019-05-22 ENCOUNTER — Telehealth (HOSPITAL_COMMUNITY): Payer: Self-pay

## 2019-05-22 ENCOUNTER — Other Ambulatory Visit: Payer: Self-pay | Admitting: *Deleted

## 2019-05-22 ENCOUNTER — Telehealth: Payer: Self-pay

## 2019-05-22 MED ORDER — FUROSEMIDE 40 MG PO TABS: 60.0000 mg | ORAL_TABLET | Freq: Every day | ORAL | 6 refills | Status: DC

## 2019-05-22 MED ORDER — PREDNISONE 5 MG PO TABS: 5.0000 mg | ORAL_TABLET | Freq: Two times a day (BID) | ORAL | 0 refills | Status: DC

## 2019-05-22 NOTE — Telephone Encounter (Signed)
RN returned call regarding question if Prednisone was sent to pharmacy.  RN confirmed RX was sent to [facility].  Pt requesting to have 30 day supply sent to local pharmacy, medication sent.  No further needs at this time.

## 2019-05-22 NOTE — Telephone Encounter (Signed)
-----   Message from [name], MD sent at 05/21/2019  9:43 PM EDT ----- If she is taking Lasix 80 mg daily, decrease to 60 mg daily with increased creatinine

## 2019-05-22 NOTE — Telephone Encounter (Signed)
Pt aware of lab results and med recommendations to decrease lasix to 60mg  daily. Pt verbalized understanding.

## 2019-05-22 NOTE — Patient Outreach (Signed)
[facility]) Care Management  05/22/2019  [name] 1941/08/24 [id number]   Third attempt to contact member post discharge, successful.  She report not feeling well today.  State she had office visit with oncologist and had another injection.  Report temperature of 99.3, rash and itching.  Took Benadryl last night, helped with itching and sleep, no improvement of rash.  Took Tylenol today to help with rash.  She has contacted MD office, order for prednisone received and son will pickup today.  State she is considering not taking the injection again if this will be the result.    She is supposed to go stay with her daughter but is hesitant.  She is aware that she is in need of additional support to maintain an adequate level of care.  Verbalizes understanding, state she will discuss when her son arrives this afternoon.  Request for meal delivery to stop as she is now stronger and able to cook more as well as she will be going to her daughter's.    Denies any urgent concerns, will request social worker to stop meals.  Will follow up with member within the next 2 weeks.  [facility] CM Care Plan Problem One     Most Recent Value  Care Plan Problem One  Risk for hospitalization related to anemia as evidenced by recent hospital admission  Role Documenting the Problem One  Care Management [place] for Problem One  Active  [place] Term Goal   Member will not be readmitted to hospital within the next 31 days  [facility] Long Term Goal Start Date  04/08/19 [Not met, date reset]  Interventions for Problem One Long Term Goal  Discussed with the member importance of having support and accepting assistance from daughter  [facility] CM Short Term Goal #1   Member will keep and attend appointment with rheumatology specialist withinthe next week  [facility] CM Short Term Goal #1 Start Date  04/22/19  [facility] CM Short Term Goal #1 Met Date  05/22/19  [facility]N CM Short Term Goal #2   Member will call to  schedule follow up with primary MD within the next 2 weeks  [facility]N CM Short Term Goal #2 Start Date  04/08/19  [facility] CM Short Term Goal #2 Met Date  05/22/19  THN CM Short Term Goal #3  Member will be able to verbalize plan for meal support within the next 2 weeks  THN CM Short Term Goal #3 Start Date  04/08/19  [facility] CM Short Term Goal #3 Met Date  04/22/19  THN CM Short Term Goal #4  Member will have plan for life alert/emergency alert system within the next 2 weeks  THN CM Short Term Goal #4 Start Date  04/22/19  Interventions for Short Term Goal #4  Discussed importance of emergency alert system in the event she is alone and unable to get to phone. Advised to have son call customer service number to inquire about benefits for device     [name], RN, MSN [place] [phone]

## 2019-05-23 ENCOUNTER — Encounter: Payer: Self-pay | Admitting: *Deleted

## 2019-05-26 ENCOUNTER — Encounter (HOSPITAL_COMMUNITY): Payer: Self-pay

## 2019-05-26 ENCOUNTER — Inpatient Hospital Stay (HOSPITAL_COMMUNITY)
Admission: EM | Admit: 2019-05-26 | Discharge: 2019-05-31 | DRG: 291 | Disposition: A | Discharge status: Home / Self Care | Payer: Medicare HMO | Attending: Internal Medicine | Admitting: Internal Medicine

## 2019-05-26 ENCOUNTER — Other Ambulatory Visit: Payer: Self-pay

## 2019-05-26 ENCOUNTER — Emergency Department (HOSPITAL_COMMUNITY): Payer: Medicare HMO

## 2019-05-26 ENCOUNTER — Ambulatory Visit: Payer: Self-pay | Admitting: Pharmacist

## 2019-05-26 DIAGNOSIS — E119 Type 2 diabetes mellitus without complications: Secondary | ICD-10-CM | POA: Diagnosis not present

## 2019-05-26 DIAGNOSIS — Z888 Allergy status to other drugs, medicaments and biological substances status: Secondary | ICD-10-CM

## 2019-05-26 DIAGNOSIS — I739 Peripheral vascular disease, unspecified: Secondary | ICD-10-CM | POA: Diagnosis not present

## 2019-05-26 DIAGNOSIS — D649 Anemia, unspecified: Secondary | ICD-10-CM | POA: Diagnosis present

## 2019-05-26 DIAGNOSIS — D638 Anemia in other chronic diseases classified elsewhere: Secondary | ICD-10-CM | POA: Diagnosis present

## 2019-05-26 DIAGNOSIS — Z209 Contact with and (suspected) exposure to unspecified communicable disease: Secondary | ICD-10-CM | POA: Diagnosis not present

## 2019-05-26 DIAGNOSIS — E7800 Pure hypercholesterolemia, unspecified: Secondary | ICD-10-CM | POA: Diagnosis present

## 2019-05-26 DIAGNOSIS — D631 Anemia in chronic kidney disease: Secondary | ICD-10-CM | POA: Diagnosis present

## 2019-05-26 DIAGNOSIS — J810 Acute pulmonary edema: Secondary | ICD-10-CM

## 2019-05-26 DIAGNOSIS — N184 Chronic kidney disease, stage 4 (severe): Secondary | ICD-10-CM | POA: Diagnosis present

## 2019-05-26 DIAGNOSIS — K922 Gastrointestinal hemorrhage, unspecified: Secondary | ICD-10-CM | POA: Diagnosis present

## 2019-05-26 DIAGNOSIS — I776 Arteritis, unspecified: Secondary | ICD-10-CM | POA: Diagnosis not present

## 2019-05-26 DIAGNOSIS — I5032 Chronic diastolic (congestive) heart failure: Secondary | ICD-10-CM | POA: Diagnosis present

## 2019-05-26 DIAGNOSIS — Z7984 Long term (current) use of oral hypoglycemic drugs: Secondary | ICD-10-CM

## 2019-05-26 DIAGNOSIS — Z952 Presence of prosthetic heart valve: Secondary | ICD-10-CM

## 2019-05-26 DIAGNOSIS — I130 Hypertensive heart and chronic kidney disease with heart failure and stage 1 through stage 4 chronic kidney disease, or unspecified chronic kidney disease: Secondary | ICD-10-CM | POA: Diagnosis present

## 2019-05-26 DIAGNOSIS — R233 Spontaneous ecchymoses: Secondary | ICD-10-CM | POA: Diagnosis present

## 2019-05-26 DIAGNOSIS — J9601 Acute respiratory failure with hypoxia: Secondary | ICD-10-CM | POA: Diagnosis present

## 2019-05-26 DIAGNOSIS — I10 Essential (primary) hypertension: Secondary | ICD-10-CM | POA: Diagnosis not present

## 2019-05-26 DIAGNOSIS — I2721 Secondary pulmonary arterial hypertension: Secondary | ICD-10-CM | POA: Diagnosis not present

## 2019-05-26 DIAGNOSIS — I700 Atherosclerosis of aorta: Secondary | ICD-10-CM | POA: Diagnosis present

## 2019-05-26 DIAGNOSIS — R069 Unspecified abnormalities of breathing: Secondary | ICD-10-CM | POA: Diagnosis not present

## 2019-05-26 DIAGNOSIS — M109 Gout, unspecified: Secondary | ICD-10-CM | POA: Diagnosis present

## 2019-05-26 DIAGNOSIS — F419 Anxiety disorder, unspecified: Secondary | ICD-10-CM | POA: Diagnosis present

## 2019-05-26 DIAGNOSIS — J811 Chronic pulmonary edema: Secondary | ICD-10-CM | POA: Diagnosis not present

## 2019-05-26 DIAGNOSIS — Z20828 Contact with and (suspected) exposure to other viral communicable diseases: Secondary | ICD-10-CM | POA: Diagnosis present

## 2019-05-26 DIAGNOSIS — A770 Spotted fever due to Rickettsia rickettsii: Secondary | ICD-10-CM | POA: Diagnosis present

## 2019-05-26 DIAGNOSIS — R0902 Hypoxemia: Secondary | ICD-10-CM | POA: Diagnosis not present

## 2019-05-26 DIAGNOSIS — I2510 Atherosclerotic heart disease of native coronary artery without angina pectoris: Secondary | ICD-10-CM | POA: Diagnosis present

## 2019-05-26 DIAGNOSIS — K219 Gastro-esophageal reflux disease without esophagitis: Secondary | ICD-10-CM | POA: Diagnosis present

## 2019-05-26 DIAGNOSIS — I482 Chronic atrial fibrillation, unspecified: Secondary | ICD-10-CM | POA: Diagnosis present

## 2019-05-26 DIAGNOSIS — I959 Hypotension, unspecified: Secondary | ICD-10-CM | POA: Diagnosis not present

## 2019-05-26 DIAGNOSIS — R0602 Shortness of breath: Secondary | ICD-10-CM | POA: Diagnosis not present

## 2019-05-26 DIAGNOSIS — D509 Iron deficiency anemia, unspecified: Secondary | ICD-10-CM | POA: Diagnosis not present

## 2019-05-26 DIAGNOSIS — Z7901 Long term (current) use of anticoagulants: Secondary | ICD-10-CM

## 2019-05-26 DIAGNOSIS — Z7952 Long term (current) use of systemic steroids: Secondary | ICD-10-CM

## 2019-05-26 DIAGNOSIS — E1151 Type 2 diabetes mellitus with diabetic peripheral angiopathy without gangrene: Secondary | ICD-10-CM | POA: Diagnosis present

## 2019-05-26 DIAGNOSIS — I7782 Antineutrophilic cytoplasmic antibody (ANCA) vasculitis: Secondary | ICD-10-CM

## 2019-05-26 DIAGNOSIS — D589 Hereditary hemolytic anemia, unspecified: Secondary | ICD-10-CM | POA: Diagnosis not present

## 2019-05-26 DIAGNOSIS — Z951 Presence of aortocoronary bypass graft: Secondary | ICD-10-CM

## 2019-05-26 DIAGNOSIS — D61818 Other pancytopenia: Secondary | ICD-10-CM | POA: Diagnosis present

## 2019-05-26 DIAGNOSIS — E1122 Type 2 diabetes mellitus with diabetic chronic kidney disease: Secondary | ICD-10-CM | POA: Diagnosis present

## 2019-05-26 DIAGNOSIS — R0600 Dyspnea, unspecified: Secondary | ICD-10-CM

## 2019-05-26 DIAGNOSIS — R768 Other specified abnormal immunological findings in serum: Secondary | ICD-10-CM

## 2019-05-26 DIAGNOSIS — Z79899 Other long term (current) drug therapy: Secondary | ICD-10-CM

## 2019-05-26 DIAGNOSIS — Z87891 Personal history of nicotine dependence: Secondary | ICD-10-CM

## 2019-05-26 DIAGNOSIS — R000 Tachycardia, unspecified: Secondary | ICD-10-CM | POA: Diagnosis not present

## 2019-05-26 LAB — URINALYSIS, ROUTINE W REFLEX MICROSCOPIC
Bilirubin Urine: NEGATIVE
Glucose, UA: NEGATIVE mg/dL
Ketones, ur: NEGATIVE mg/dL
Nitrite: NEGATIVE
Protein, ur: 100 mg/dL — AB
RBC / HPF: >50 RBC/hpf — ABNORMAL HIGH (ref 0–5)
Specific Gravity, Urine: 1.006 (ref 1.005–1.030)
pH: 6 (ref 5.0–8.0)

## 2019-05-26 LAB — COMPREHENSIVE METABOLIC PANEL
ALT: 12 U/L (ref 0–44)
AST: 19 U/L (ref 15–41)
Albumin: 2.6 g/dL — ABNORMAL LOW (ref 3.5–5.0)
Alkaline Phosphatase: 53 U/L (ref 38–126)
Anion gap: 7 (ref 5–15)
BUN: 53 mg/dL — ABNORMAL HIGH (ref 8–23)
CO2: 20 mmol/L — ABNORMAL LOW (ref 22–32)
Calcium: 8.3 mg/dL — ABNORMAL LOW (ref 8.9–10.3)
Chloride: 106 mmol/L (ref 98–111)
Creatinine, Ser: 1.94 mg/dL — ABNORMAL HIGH (ref 0.44–1.00)
GFR calc Af Amer: 28 mL/min — ABNORMAL LOW (ref >60)
GFR calc non Af Amer: 24 mL/min — ABNORMAL LOW (ref >60)
Glucose, Bld: 117 mg/dL — ABNORMAL HIGH (ref 70–99)
Potassium: 4.2 mmol/L (ref 3.5–5.1)
Sodium: 133 mmol/L — ABNORMAL LOW (ref 135–145)
Total Bilirubin: 0.8 mg/dL (ref 0.3–1.2)
Total Protein: 6 g/dL — ABNORMAL LOW (ref 6.5–8.1)

## 2019-05-26 LAB — CBC WITH DIFFERENTIAL/PLATELET
Abs Immature Granulocytes: 0.02 10*3/uL (ref 0.00–0.07)
Basophils Absolute: 0 10*3/uL (ref 0.0–0.1)
Basophils Relative: 1 %
Eosinophils Absolute: 0.1 10*3/uL (ref 0.0–0.5)
Eosinophils Relative: 3 %
HCT: 20.3 % — ABNORMAL LOW (ref 36.0–46.0)
Hemoglobin: 6.4 g/dL — CL (ref 12.0–15.0)
Immature Granulocytes: 1 %
Lymphocytes Relative: 9 %
Lymphs Abs: 0.4 10*3/uL — ABNORMAL LOW (ref 0.7–4.0)
MCH: 30.9 pg (ref 26.0–34.0)
MCHC: 31.5 g/dL (ref 30.0–36.0)
MCV: 98.1 fL (ref 80.0–100.0)
Monocytes Absolute: 0.2 10*3/uL (ref 0.1–1.0)
Monocytes Relative: 5 %
Neutro Abs: 3.5 10*3/uL (ref 1.7–7.7)
Neutrophils Relative %: 81 %
Platelets: 155 10*3/uL (ref 150–400)
RBC: 2.07 MIL/uL — ABNORMAL LOW (ref 3.87–5.11)
RDW: 18.7 % — ABNORMAL HIGH (ref 11.5–15.5)
WBC: 4.2 10*3/uL (ref 4.0–10.5)
nRBC: 0 % (ref 0.0–0.2)

## 2019-05-26 LAB — SEDIMENTATION RATE: Sed Rate: 94 mm/hr — ABNORMAL HIGH (ref 0–22)

## 2019-05-26 LAB — C-REACTIVE PROTEIN: CRP: 3.3 mg/dL — ABNORMAL HIGH (ref <1.0)

## 2019-05-26 LAB — VITAMIN B12: Vitamin B-12: 1016 pg/mL — ABNORMAL HIGH (ref 180–914)

## 2019-05-26 LAB — BRAIN NATRIURETIC PEPTIDE: B Natriuretic Peptide: 395.1 pg/mL — ABNORMAL HIGH (ref 0.0–100.0)

## 2019-05-26 LAB — IRON AND TIBC
Iron: 51 ug/dL (ref 28–170)
Saturation Ratios: 21 % (ref 10.4–31.8)
TIBC: 239 ug/dL — ABNORMAL LOW (ref 250–450)
UIBC: 188 ug/dL

## 2019-05-26 LAB — HEMOGLOBIN AND HEMATOCRIT, BLOOD
HCT: 24.9 % — ABNORMAL LOW (ref 36.0–46.0)
Hemoglobin: 7.9 g/dL — ABNORMAL LOW (ref 12.0–15.0)

## 2019-05-26 LAB — PREPARE RBC (CROSSMATCH)

## 2019-05-26 LAB — TROPONIN I (HIGH SENSITIVITY)
Troponin I (High Sensitivity): 14 ng/L (ref <18)
Troponin I (High Sensitivity): 14 ng/L (ref <18)

## 2019-05-26 LAB — GLUCOSE, CAPILLARY
Glucose-Capillary: 171 mg/dL — ABNORMAL HIGH (ref 70–99)
Glucose-Capillary: 124 mg/dL — ABNORMAL HIGH (ref 70–99)

## 2019-05-26 LAB — SARS CORONAVIRUS 2 BY RT PCR (HOSPITAL ORDER, PERFORMED IN ~~LOC~~ HOSPITAL LAB): SARS Coronavirus 2: NEGATIVE

## 2019-05-26 LAB — FOLATE: Folate: 7.3 ng/mL (ref >5.9)

## 2019-05-26 LAB — PROTIME-INR
INR: 3.7 — ABNORMAL HIGH (ref 0.8–1.2)
Prothrombin Time: 35.8 seconds — ABNORMAL HIGH (ref 11.4–15.2)

## 2019-05-26 LAB — PROCALCITONIN: Procalcitonin: <0.1 ng/mL

## 2019-05-26 LAB — FERRITIN: Ferritin: 178 ng/mL (ref 11–307)

## 2019-05-26 LAB — ABO/RH: ABO/RH(D): A POS

## 2019-05-26 MED ORDER — ACETAMINOPHEN 325 MG PO TABS
650.0000 mg | ORAL_TABLET | Freq: Four times a day (QID) | ORAL | Status: DC | PRN
  Administered 2019-05-26: 15:00:00 650 mg via ORAL
  Filled 2019-05-26: qty 2

## 2019-05-26 MED ORDER — ONDANSETRON HCL 4 MG/2ML IJ SOLN
4.0000 mg | Freq: Three times a day (TID) | INTRAMUSCULAR | Status: DC | PRN
  Administered 2019-05-26 – 2019-05-30 (×2): 4 mg via INTRAVENOUS
  Filled 2019-05-26 (×2): qty 2

## 2019-05-26 MED ORDER — TADALAFIL 20 MG PO TABS
40.0000 mg | ORAL_TABLET | Freq: Every day | ORAL | Status: DC
  Administered 2019-05-26 – 2019-05-31 (×6): 40 mg via ORAL
  Filled 2019-05-26 (×7): qty 2

## 2019-05-26 MED ORDER — SODIUM CHLORIDE 0.9% IV SOLUTION
Freq: Once | INTRAVENOUS | Status: AC
  Administered 2019-05-26: 11:00:00 via INTRAVENOUS

## 2019-05-26 MED ORDER — INSULIN ASPART 100 UNIT/ML ~~LOC~~ SOLN
0.0000 [IU] | Freq: Every day | SUBCUTANEOUS | Status: DC
  Administered 2019-05-28 – 2019-05-30 (×2): 2 [IU] via SUBCUTANEOUS

## 2019-05-26 MED ORDER — VITAMIN B-12 1000 MCG PO TABS
1000.0000 ug | ORAL_TABLET | Freq: Every day | ORAL | Status: DC
  Administered 2019-05-27 – 2019-05-31 (×5): 1000 ug via ORAL
  Filled 2019-05-26 (×5): qty 1

## 2019-05-26 MED ORDER — SPIRONOLACTONE 25 MG PO TABS
25.0000 mg | ORAL_TABLET | Freq: Every day | ORAL | Status: DC
  Administered 2019-05-26 – 2019-05-31 (×6): 25 mg via ORAL
  Filled 2019-05-26 (×7): qty 1

## 2019-05-26 MED ORDER — PREDNISONE 20 MG PO TABS
40.0000 mg | ORAL_TABLET | Freq: Every day | ORAL | Status: DC
  Administered 2019-05-26 – 2019-05-31 (×6): 40 mg via ORAL
  Filled 2019-05-26 (×6): qty 2

## 2019-05-26 MED ORDER — ALLOPURINOL 100 MG PO TABS
100.0000 mg | ORAL_TABLET | Freq: Every day | ORAL | Status: DC
  Administered 2019-05-26 – 2019-05-31 (×6): 100 mg via ORAL
  Filled 2019-05-26 (×6): qty 1

## 2019-05-26 MED ORDER — ALPRAZOLAM 0.25 MG PO TABS
0.2500 mg | ORAL_TABLET | Freq: Every day | ORAL | Status: DC
  Administered 2019-05-26 – 2019-05-30 (×5): 0.25 mg via ORAL
  Filled 2019-05-26 (×5): qty 1

## 2019-05-26 MED ORDER — VITAMIN D3 25 MCG (1000 UNIT) PO TABS
1000.0000 [IU] | ORAL_TABLET | Freq: Every day | ORAL | Status: DC
  Administered 2019-05-27 – 2019-05-31 (×5): 1000 [IU] via ORAL
  Filled 2019-05-26 (×5): qty 1

## 2019-05-26 MED ORDER — PANTOPRAZOLE SODIUM 40 MG PO TBEC
40.0000 mg | DELAYED_RELEASE_TABLET | Freq: Two times a day (BID) | ORAL | Status: DC
  Administered 2019-05-26 – 2019-05-31 (×10): 40 mg via ORAL
  Filled 2019-05-26 (×10): qty 1

## 2019-05-26 MED ORDER — HYDRALAZINE HCL 50 MG PO TABS
100.0000 mg | ORAL_TABLET | Freq: Three times a day (TID) | ORAL | Status: DC
  Administered 2019-05-26 – 2019-05-31 (×16): 100 mg via ORAL
  Filled 2019-05-26 (×17): qty 2

## 2019-05-26 MED ORDER — FUROSEMIDE 10 MG/ML IJ SOLN
60.0000 mg | Freq: Two times a day (BID) | INTRAMUSCULAR | Status: DC
  Administered 2019-05-26 – 2019-05-30 (×8): 60 mg via INTRAVENOUS
  Filled 2019-05-26 (×8): qty 6

## 2019-05-26 MED ORDER — ATORVASTATIN CALCIUM 40 MG PO TABS
80.0000 mg | ORAL_TABLET | Freq: Every day | ORAL | Status: DC
  Administered 2019-05-26 – 2019-05-30 (×5): 80 mg via ORAL
  Filled 2019-05-26 (×5): qty 2

## 2019-05-26 MED ORDER — PREDNISONE 5 MG PO TABS
5.0000 mg | ORAL_TABLET | Freq: Two times a day (BID) | ORAL | Status: DC
  Filled 2019-05-26: qty 1

## 2019-05-26 MED ORDER — WARFARIN - PHARMACIST DOSING INPATIENT
Freq: Every day | Status: DC
  Administered 2019-05-30: 19:00:00

## 2019-05-26 MED ORDER — VITAMIN C 500 MG PO TABS
500.0000 mg | ORAL_TABLET | Freq: Every day | ORAL | Status: DC
  Administered 2019-05-27 – 2019-05-31 (×5): 500 mg via ORAL
  Filled 2019-05-26 (×5): qty 1

## 2019-05-26 MED ORDER — METOPROLOL TARTRATE 25 MG PO TABS
12.5000 mg | ORAL_TABLET | Freq: Two times a day (BID) | ORAL | Status: DC
  Administered 2019-05-26 – 2019-05-31 (×10): 12.5 mg via ORAL
  Filled 2019-05-26 (×10): qty 1

## 2019-05-26 MED ORDER — ORAL CARE MOUTH RINSE
15.0000 mL | Freq: Two times a day (BID) | OROMUCOSAL | Status: DC
  Administered 2019-05-26 – 2019-05-31 (×4): 15 mL via OROMUCOSAL

## 2019-05-26 MED ORDER — MAGNESIUM OXIDE 400 (241.3 MG) MG PO TABS
400.0000 mg | ORAL_TABLET | Freq: Every day | ORAL | Status: DC
  Administered 2019-05-27 – 2019-05-31 (×5): 400 mg via ORAL
  Filled 2019-05-26 (×5): qty 1

## 2019-05-26 MED ORDER — INSULIN ASPART 100 UNIT/ML ~~LOC~~ SOLN
0.0000 [IU] | Freq: Three times a day (TID) | SUBCUTANEOUS | Status: DC
  Administered 2019-05-26 – 2019-05-27 (×4): 2 [IU] via SUBCUTANEOUS
  Administered 2019-05-28: 12:00:00 5 [IU] via SUBCUTANEOUS
  Administered 2019-05-28 – 2019-05-29 (×2): 3 [IU] via SUBCUTANEOUS
  Administered 2019-05-29: 7 [IU] via SUBCUTANEOUS
  Administered 2019-05-30 (×2): 5 [IU] via SUBCUTANEOUS
  Administered 2019-05-31: 3 [IU] via SUBCUTANEOUS

## 2019-05-26 MED ORDER — DOXYCYCLINE HYCLATE 100 MG PO TABS
100.0000 mg | ORAL_TABLET | Freq: Two times a day (BID) | ORAL | Status: DC
  Administered 2019-05-26 – 2019-05-31 (×10): 100 mg via ORAL
  Filled 2019-05-26 (×10): qty 1

## 2019-05-26 MED ORDER — WARFARIN SODIUM 5 MG PO TABS
5.0000 mg | ORAL_TABLET | Freq: Once | ORAL | Status: AC
  Administered 2019-05-26: 5 mg via ORAL
  Filled 2019-05-26: qty 1

## 2019-05-26 MED ORDER — FUROSEMIDE 10 MG/ML IJ SOLN
80.0000 mg | Freq: Once | INTRAMUSCULAR | Status: AC
  Administered 2019-05-26: 12:00:00 80 mg via INTRAVENOUS
  Filled 2019-05-26: qty 8

## 2019-05-26 MED ORDER — ACETAMINOPHEN 650 MG RE SUPP: 650.0000 mg | Freq: Four times a day (QID) | RECTAL | Status: DC | PRN

## 2019-05-26 MED ORDER — POLYETHYLENE GLYCOL 3350 17 G PO PACK
17.0000 g | PACK | Freq: Every day | ORAL | Status: DC
  Filled 2019-05-26 (×6): qty 1

## 2019-05-26 NOTE — ED Notes (Addendum)
Date and time results received: 05/26/19 0910 (use smartphrase ".now" to insert current time)  Test: Hemoglobin Critical Value: 6.4  Name of Provider Notified: Alvino Chapel

## 2019-05-26 NOTE — Progress Notes (Signed)
ANTICOAGULATION CONSULT NOTE - Initial Consult  Pharmacy Consult for warfarin Indication: atrial fibrillation and mechanical heart valve  Allergies  Allergen Reactions  . Flagyl [Metronidazole] Rash    ALL-OVER BODY RASH  . Coreg [Carvedilol] Other (See Comments)    Terrible cramping in the feet and had a lot of bowel movements, but not diarrhea  . Losartan Swelling    Patient doesn't recall site of swelling  . Verapamil Hives  . Zetia [Ezetimibe] Other (See Comments)    Reaction not recalled  . Zocor [Simvastatin - High Dose] Other (See Comments)    Reaction not recalled    Patient Measurements: Height: 5\' 2"  (157.5 cm) Weight: 115 lb 1.3 oz (52.2 kg) IBW/kg (Calculated) : 50.1   Vital Signs: Temp: 98 F (36.7 C) (08/17 1351) Temp Source: Oral (08/17 1351) BP: 178/55 (08/17 1351) Pulse Rate: 76 (08/17 1351)  Labs: Recent Labs    05/26/19 0844 05/26/19 1106  HGB 6.4*  --   HCT 20.3*  --   PLT 155  --   CREATININE 1.94*  --   TROPONINIHS 14 14    Estimated Creatinine Clearance: 18.3 mL/min (A) (by C-G formula based on SCr of 1.94 mg/dL (H)).   Medical History: Past Medical History:  Diagnosis Date  . Aortic atherosclerosis ([place]) 01/23/2018  . Atrial fibrillation ([place])   . Carotid artery disease ([place])   . Cholelithiasis 01/23/2018  . Chronic anticoagulation   . Coronary artery disease    status post coronary artery bypass grafting times 07/10/2004  . Diabetes mellitus   . Hypercholesteremia   . Hypertension   . Mechanical heart valve present    H. aortic valve replacement at the time of bypass surgery October 2005  . Moderate to severe pulmonary hypertension ([place])   . Peripheral arterial disease ([facility])    history of left common iliac artery PTA and stenting for a chronic total occlusion 08/26/01     Assessment: Pt presents with SOB, not on O2 at baseline.  On warfarin for Hx of AFib and mechanical heart valve. Home warfarin dose is 5mg  on M,W,F and  7.5mg  all other days.  Last dose 8/16  05/26/2019 INR Hgb 6.4, anemia of chronic disease, receiving 1 unit pRBC today Plts 155 DI: doxycycline: may increase warfarin effects  Goal of Therapy:  INR 2-3 (per anti-coag notes)   Plan:  Warfarin 5mg  per home regiment Daily INR  Dolly Rias RPh 05/26/2019, 2:49 PM

## 2019-05-26 NOTE — ED Notes (Addendum)
Name/Age/Gender [name] 78 y.o. female  Code Status   Code Status: Prior  Home/SNF/Other Home  Chief Complaint requesting blood tranfusion  Triage Note BIB EMS per Ems last night pt started feeling weak and SHOB A/Ox4 Pt denies pain. complains of SHOB. inital 78% on RA. 100% on Non rebreather. Not on oxygen normally.   cbg 131   Level of Care/Admitting Diagnosis ED Disposition    ED Disposition Condition [facility]: [place] [100102]  Level of Care: Telemetry [5]  Admit to tele based on following criteria: Other see comments  Admit to tele based on following criteria: Acute CHF  Comments: anemia  Covid Evaluation: Confirmed COVID Negative  Diagnosis: Anemia [852778]  Admitting Physician: Elodia Florence [phone]  Attending Physician: Cephus Slater, A CALDWELL [phone]  PT Class (Do Not Modify): Observation [104]  PT Acc Code (Do Not Modify): Observation [10022]       Medical/Surgery History Past Medical History:  Diagnosis Date  . Aortic atherosclerosis ([place]) 01/23/2018  . Atrial fibrillation ([place])   . Carotid artery disease ([place])   . Cholelithiasis 01/23/2018  . Chronic anticoagulation   . Coronary artery disease    status post coronary artery bypass grafting times 07/10/2004  . Diabetes mellitus   . Hypercholesteremia   . Hypertension   . Mechanical heart valve present    H. aortic valve replacement at the time of bypass surgery October 2005  . Moderate to severe pulmonary hypertension ([place])   . Peripheral arterial disease (HCC)    history of left common iliac artery PTA and stenting for a chronic total occlusion 08/26/01   Past Surgical History:  Procedure Laterality Date  . AORTIC VALVE REPLACEMENT    . CARDIAC CATHETERIZATION  11/10/2004   40% right common illiac, 70% in stent restenosis of distal left common illiac,   . CARDIAC CATHETERIZATION  05/18/2004   LAD 50-70% midstenosis, RCA dominant w/50%  stenosis, 50% Right common Illiac artery ostial stenosis, 90% in stent restenosis within midportion of left common illiac stent  . Carotid Duplex  03/12/2012   RSA-elev. velocities suggestive of a 50-69% diameter reduction, Right&Left Bulb/Prox ICA-mild-mod.fibrous plaqueelevating Velocities abnormal study.  . CHOLECYSTECTOMY N/A 03/01/2018   Procedure: LAPAROSCOPIC CHOLECYSTECTOMY;  Surgeon: Judeth Horn, MD;  Location: [facility];  Service: General;  Laterality: N/A;  . COLONOSCOPY WITH PROPOFOL N/A 01/22/2018   Procedure: COLONOSCOPY WITH PROPOFOL;  Surgeon: Wilford Corner, MD;  Location: [facility];  Service: Endoscopy;  Laterality: N/A;  . ESOPHAGOGASTRODUODENOSCOPY (EGD) WITH PROPOFOL N/A 01/22/2018   Procedure: ESOPHAGOGASTRODUODENOSCOPY (EGD) WITH PROPOFOL;  Surgeon: Wilford Corner, MD;  Location: [facility];  Service: Endoscopy;  Laterality: N/A;  . ESOPHAGOGASTRODUODENOSCOPY (EGD) WITH PROPOFOL N/A 11/21/2018   Procedure: ESOPHAGOGASTRODUODENOSCOPY (EGD) WITH PROPOFOL;  Surgeon: [name], MD;  Location: [facility];  Service: Gastroenterology;  Laterality: N/A;  . ESOPHAGOGASTRODUODENOSCOPY (EGD) WITH PROPOFOL Left 02/27/2019   Procedure: ESOPHAGOGASTRODUODENOSCOPY (EGD) WITH PROPOFOL;  Surgeon: Arta Silence, MD;  Location: [facility];  Service: Endoscopy;  Laterality: Left;  . GIVENS CAPSULE STUDY N/A 11/21/2018   Procedure: GIVENS CAPSULE STUDY;  Surgeon: [name], MD;  Location: [facility];  Service: Gastroenterology;  Laterality: N/A;  To be deployed during EGD  . Lower Ext. Duplex  03/12/2012   Right Proximal CIA- vessel narrowing w/elevated velocities 0-49% diameter reduction. Right SFA-mild mixed density plaque throughout vessel.  [name] NM MYOCAR PERF WALL MOTION  05/19/2010   protocol: Persantine, post stress EF  65%, negative for ischemia, low risk scan  . RIGHT HEART CATH N/A 06/27/2018   Procedure: RIGHT HEART CATH;  Surgeon: [name], MD;  Location: [facility];  Service: Cardiovascular;  Laterality: N/A;  . TRANSTHORACIC ECHOCARDIOGRAM  08/29/2012   Moderately calcified annulus of mitral valve, moderate regurg. of both mitral valve and tricuspid valve.      Allergies Allergies  Allergen Reactions  . Flagyl [Metronidazole] Rash    ALL-OVER BODY RASH  . Coreg [Carvedilol] Other (See Comments)    Terrible cramping in the feet and had a lot of bowel movements, but not diarrhea  . Losartan Swelling    Patient doesn't recall site of swelling  . Verapamil Hives  . Zetia [Ezetimibe] Other (See Comments)    Reaction not recalled  . Zocor [Simvastatin - High Dose] Other (See Comments)    Reaction not recalled    IV Location/Drains/Wounds Patient Lines/Drains/Airways Status   Active Line/Drains/Airways    Name:   Placement date:   Placement time:   Site:   Days:   Peripheral IV 05/26/19 Anterior;Left Forearm   05/26/19    0819    Forearm   less than 1   External Urinary Catheter   04/04/19    1135    -   52          Labs/Imaging Results for orders placed or performed during the hospital encounter of 05/26/19 (from the past 48 hour(s))  ABO/Rh     Status: None   Collection Time: 05/26/19  8:43 AM  Result Value Ref Range   ABO/RH(D)      A POS Performed at [facility], [place] [address], [place], [place] [address]   CBC with Differential     Status: Abnormal   Collection Time: 05/26/19  8:44 AM  Result Value Ref Range   WBC 4.2 4.0 - 10.5 K/uL   RBC 2.07 (L) 3.87 - 5.11 MIL/uL   Hemoglobin 6.4 (LL) 12.0 - 15.0 g/dL    Comment: This critical result has verified and been called to LEVINS, A. RN by Raelyn Ensign on 08 17 2020 at 0909, and has been read back. Critical Result Verified   HCT 20.3 (L) 36.0 - 46.0 %   MCV 98.1 80.0 - 100.0 fL   MCH 30.9 26.0 - 34.0 pg   MCHC 31.5 30.0 - 36.0 g/dL   RDW 18.7 (H) 11.5 - 15.5 %   Platelets 155 150 - 400 K/uL   nRBC 0.0 0.0 - 0.2 %   Neutrophils Relative % 81 %    Neutro Abs 3.5 1.7 - 7.7 K/uL   Lymphocytes Relative 9 %   Lymphs Abs 0.4 (L) 0.7 - 4.0 K/uL   Monocytes Relative 5 %   Monocytes Absolute 0.2 0.1 - 1.0 K/uL   Eosinophils Relative 3 %   Eosinophils Absolute 0.1 0.0 - 0.5 K/uL   Basophils Relative 1 %   Basophils Absolute 0.0 0.0 - 0.1 K/uL   Immature Granulocytes 1 %   Abs Immature Granulocytes 0.02 0.00 - 0.07 K/uL    Comment: Performed at [facility], [place] [address], [place], [place] [address]  Comprehensive metabolic panel     Status: Abnormal   Collection Time: 05/26/19  8:44 AM  Result Value Ref Range   Sodium 133 (L) 135 - 145 mmol/L   Potassium 4.2 3.5 - 5.1 mmol/L   Chloride 106 98 - 111 mmol/L   CO2  20 (L) 22 - 32 mmol/L   Glucose, Bld 117 (H) 70 - 99 mg/dL   BUN 53 (H) 8 - 23 mg/dL   Creatinine, Ser 1.94 (H) 0.44 - 1.00 mg/dL   Calcium 8.3 (L) 8.9 - 10.3 mg/dL   Total Protein 6.0 (L) 6.5 - 8.1 g/dL   Albumin 2.6 (L) 3.5 - 5.0 g/dL   AST 19 15 - 41 U/L   ALT 12 0 - 44 U/L   Alkaline Phosphatase 53 38 - 126 U/L   Total Bilirubin 0.8 0.3 - 1.2 mg/dL   GFR calc non Af Amer 24 (L) >60 mL/min   GFR calc Af Amer 28 (L) >60 mL/min   Anion gap 7 5 - 15    Comment: Performed at [facility], [place] [address], [place], [place] [address]  Brain natriuretic peptide     Status: Abnormal   Collection Time: 05/26/19  8:44 AM  Result Value Ref Range   B Natriuretic Peptide 395.1 (H) 0.0 - 100.0 pg/mL    Comment: Performed at [facility], [place] [address], [place], [place] [address]  Troponin I (High Sensitivity)     Status: None   Collection Time: 05/26/19  8:44 AM  Result Value Ref Range   Troponin I (High Sensitivity) 14 <18 ng/L    Comment: (NOTE) Elevated high sensitivity troponin I (hsTnI) values and significant  changes across serial measurements may suggest ACS but many other  chronic and acute conditions are known to elevate hsTnI results.  Refer to the "Links"  section for chest pain algorithms and additional  guidance. Performed at [facility], [place] [address], [place], [place] [address]   Type and screen [place]     Status: None (Preliminary result)   Collection Time: 05/26/19  8:44 AM  Result Value Ref Range   ABO/RH(D) A POS    Antibody Screen NEG    Sample Expiration 05/29/2019,2359    Unit Number [id number]    Blood Component Type RED CELLS,LR    Unit division 00    Status of Unit ISSUED    Transfusion Status OK TO TRANSFUSE    Crossmatch Result      Compatible Performed at [facility], [place] [address] [place], [place] [address]    Unit Number [id number]    Blood Component Type RED CELLS,LR    Unit division 00    Status of Unit ALLOCATED    Transfusion Status OK TO TRANSFUSE    Crossmatch Result Compatible   SARS Coronavirus 2 [facility] order, Performed in [facility] hospital lab) Nasopharyngeal Nasopharyngeal Swab     Status: None   Collection Time: 05/26/19  8:44 AM   Specimen: Nasopharyngeal Swab  Result Value Ref Range   SARS Coronavirus 2 NEGATIVE NEGATIVE    Comment: (NOTE) If result is NEGATIVE SARS-CoV-2 target nucleic acids are NOT DETECTED. The SARS-CoV-2 RNA is generally detectable in upper and lower  respiratory specimens during the acute phase of infection. The lowest  concentration of SARS-CoV-2 viral copies this assay can detect is 250  copies / mL. A negative result does not preclude SARS-CoV-2 infection  and should not be used as the sole basis for treatment or other  patient management decisions.  A negative result may occur with  improper specimen collection / handling, submission of specimen other  than nasopharyngeal swab, presence of viral mutation(s) within the  areas targeted by this assay, and inadequate number  of viral copies  (<250 copies / mL). A negative result must be combined with clinical  observations, patient history, and  epidemiological information. If result is POSITIVE SARS-CoV-2 target nucleic acids are DETECTED. The SARS-CoV-2 RNA is generally detectable in upper and lower  respiratory specimens dur ing the acute phase of infection.  Positive  results are indicative of active infection with SARS-CoV-2.  Clinical  correlation with patient history and other diagnostic information is  necessary to determine patient infection status.  Positive results do  not rule out bacterial infection or co-infection with other viruses. If result is PRESUMPTIVE POSTIVE SARS-CoV-2 nucleic acids MAY BE PRESENT.   A presumptive positive result was obtained on the submitted specimen  and confirmed on repeat testing.  While 2019 novel coronavirus  (SARS-CoV-2) nucleic acids may be present in the submitted sample  additional confirmatory testing may be necessary for epidemiological  and / or clinical management purposes  to differentiate between  SARS-CoV-2 and other Sarbecovirus currently known to infect humans.  If clinically indicated additional testing with an alternate test  methodology [id number]) is advised. The SARS-CoV-2 RNA is generally  detectable in upper and lower respiratory sp ecimens during the acute  phase of infection. The expected result is Negative. Fact Sheet for Patients:  StrictlyIdeas.no Fact Sheet for Healthcare Providers: BankingDealers.co.za This test is not yet approved or cleared by the [place] FDA and has been authorized for detection and/or diagnosis of SARS-CoV-2 by FDA under an Emergency Use Authorization (EUA).  This EUA will remain in effect (meaning this test can be used) for the duration of the COVID-19 declaration under Section 564(b)(1) of the Act, 21 U.S.C. section 360bbb-3(b)(1), unless the authorization is terminated or revoked sooner. Performed at [facility], [place] [address] [place], [place] [address]    Troponin I (High Sensitivity)     Status: None   Collection Time: 05/26/19 11:06 AM  Result Value Ref Range   Troponin I (High Sensitivity) 14 <18 ng/L    Comment: (NOTE) Elevated high sensitivity troponin I (hsTnI) values and significant  changes across serial measurements may suggest ACS but many other  chronic and acute conditions are known to elevate hsTnI results.  Refer to the "Links" section for chest pain algorithms and additional  guidance. Performed at [facility], [place] [address], [place], [place] [address]   Prepare RBC     Status: None   Collection Time: 05/26/19 11:07 AM  Result Value Ref Range   Order Confirmation      ORDER PROCESSED BY BLOOD BANK Performed at [facility], [place] [address], [place], [place] [address]    Dg Chest Portable 1 View  Result Date: 05/26/2019 CLINICAL DATA:  Weakness and shortness of breath since last night. EXAM: PORTABLE CHEST 1 VIEW COMPARISON:  05/08/2019. FINDINGS: Stable enlarged cardiac silhouette and post CABG changes. Increased diffuse prominence of the interstitial markings, especially on the right, with probable mild interspersed airspace opacities. Kerley lines are noted at the right lung base. No pleural fluid is seen. Bilateral calcified granulomata are again noted. Also noted are coarse left breast calcifications compatible with degenerated fibroadenomata. Diffuse osteopenia. Mild to moderate left and minimal right glenohumeral joint degenerative changes. IMPRESSION: 1. Worsening pulmonary edema or pneumonia/pneumonitis. 2. Stable cardiomegaly. Electronically Signed   By: Claudie Revering M.D.   On: 05/26/2019 09:01      Pending Labs Unresulted Labs (From admission, onward)    Start  Ordered   05/27/19 0500  Procalcitonin  Daily,   R     05/26/19 1113   05/26/19 1800  Hemoglobin and hematocrit, blood  Once,   STAT     05/26/19 1229   05/26/19 1233  Occult blood card to lab, stool  Once,    STAT     05/26/19 1232   05/26/19 1230  Vitamin B12  Add-on,   AD     05/26/19 1229   05/26/19 1230  Folate  Add-on,   AD     05/26/19 1229   05/26/19 1229  Iron and TIBC  Add-on,   AD     05/26/19 1229   05/26/19 1229  Ferritin  Add-on,   AD     05/26/19 1229   05/26/19 1150  Protime-INR  Once,   STAT     05/26/19 1150   05/26/19 1114  Procalcitonin - Baseline  ONCE - STAT,   STAT     05/26/19 1113          Vitals/Pain Today's Vitals   05/26/19 1130 05/26/19 1145 05/26/19 1158 05/26/19 1231  BP: (!) 166/60 (!) 185/50 (!) 179/57 (!) 179/52  Pulse: 72 76 76 74  Resp: (!) 27 (!) 28 20 (!) 24  Temp:   98 F (36.7 C) 98.1 F (36.7 C)  TempSrc:   Oral Oral  SpO2: 95% 92% 94% 95%  PainSc:        Intake/Output Last 24 hours No intake or output data in the 24 hours ending 05/26/19 1237  Isolation Precautions No active isolations  Medications Medications  0.9 %  sodium chloride infusion (Manually program via Guardrails IV Fluids) (has no administration in time range)  furosemide (LASIX) injection 80 mg (80 mg Intravenous Given 05/26/19 1148)    Mobility Walks with walker Low fall risk

## 2019-05-26 NOTE — ED Triage Notes (Signed)
BIB EMS per Ems last night pt started feeling weak and SHOB A/Ox4 Pt denies pain. complains of SHOB. inital 78% on RA. 100% on Non rebreather. Not on oxygen normally.   cbg 131

## 2019-05-26 NOTE — ED Notes (Signed)
Pt taken off of non-rebreather and O2 decreased to 88 Pt placed on 2L nasal canula currently 94%.

## 2019-05-26 NOTE — Consult Note (Signed)
NAME:  [name], MRN:  [id number], DOB:  [birth year], LOS: 0 ADMISSION DATE:  05/26/2019, CONSULTATION DATE:  05/26/19 REFERRING MD:  Florene Glen, CHIEF COMPLAINT:  ANCA+   Brief History   History of chronic hemolytic anemia, chemical aortic valve replacement, HFpEF   History of present illness   [name] is an 78 year old woman who presents due to symptomatic anemia.  She has a history of hemolytic anemia requiring intermittent transfusions.  She is not noticing increased swelling or shortness of breath compared to her baseline.  Pulmonology has been consulted due to concern for pneumonitis pattern on chest x-ray and a history of previous ANCA positivity in the past few months, which was checked due to a purpuric rash.  She has no previous rheumatologic history or known family history of rheumatologic disease, although she follows with rheumatology for gout.  She admits to intermittent nosebleeds, but denies hematuria, paresthesias or numbness, or focal weakness.  She is chronically fatigued.  She has a history of chronic hemolytic anemia, and there is mention of an autoimmune etiology, but no description of what that is in the oncology notes.  Per her home medication list, she is on prednisone 40 mg daily.  Past Medical History  Mechanical aortic valve replacement Atrial fibrillation Chronic Coumadin use Heart failure with preserved ejection fraction Pulmonary hypertension GI bleed Hemolytic anemia and thrombocytopenia Diabetes  Significant Hospital Events   8/17>> transfused 1 unit of packed red blood cells for hemoglobin 6.4, down from 7.6 on 05/19/2019.  Consults:  Pulmonary  Procedures:    Significant Diagnostic Tests:  CXR 8/17- suggestive of pulmonary edema p-ANCA +1:640, elevated MPO & PR3 on 05/08/2019 ESR 94<--114 on 730/20 CRP 3.3<-2.5 on 05/08/2019   Micro Data:  COVID negative  Antimicrobials:  Doxycycline  Interim history/subjective:    Objective   Blood  pressure (!) 190/52, pulse 80, temperature 98.8 F (37.1 C), temperature source Oral, resp. rate (!) 22, height _0  (1.575 m), weight 52.2 kg, SpO2 94 %.        Intake/Output Summary (Last 24 hours) at 05/26/2019 2017 Last data filed at 05/26/2019 1829 Gross per 24 hour  Intake 352 ml  Output 1000 ml  Net -648 ml   Filed Weights   05/26/19 1351  Weight: 52.2 kg    Examination: General: Frail appearing elderly woman laying in bed in no acute distress HENT: Normocephalic, atraumatic Lungs: Basilar rales, no wheezing.  Breathing comfortably on 4 L of supplemental oxygen Cardiovascular: Mechanical click, JVD.  Regular rhythm. Abdomen: Soft, nontender Extremities: Bilateral symmetric lower extremity pitting edema, no cyanosis Neuro: Awake and alert, moving all extremities, face symmetric.  Speaking in full sentences. Derm: Petechiae on medial lower extremities, some palpable.  No large ecchymoses, no other rashes.  Pallor.  Resolved Hospital Problem list     Assessment & Plan:  Acute hypoxic respiratory failure likely due to pulmonary edema.  Previous CT scans showed normal parenchyma with groundglass, which is highly suggestive of pulmonary edema from her pulmonary hypertension and HFpFE.  Her x-rays look similar to previous and fits her clinical presentation.  Specifically, no signs of ill-defined nodules or cavities.  BNP is elevated, and clinically she appears volume overloaded.  I suspect that her anemia puts her in high-output heart failure. -Recommend diuresis. -Titrate down supplemental oxygen as able to maintain saturations greater than 90% -Currently there is no indication for additional chest imaging or invasive procedures.  Positive p-ANCA with positive MPO and PR-3-this constellation is  less specific, but still raises concern for vasculitis, especially with chronically elevated ESR and CRP.  Of note, multiple previous urinalyses demonstrate hematuria, and she has a history  of epistaxis.  Her chest CT does not show findings that are concerning for GPA and she lacks the symptom of asthma that frequently is associated with a EGPA.  If she has been on chronic prednisone, that could partially mask some of the symptoms. - Consider ENT evaluation of her nasal mucosa to determine if there are findings concerning for vasculitis, which would suggest GPA. - Consider nephrology input to determine if they are concerned for renal vasculitis.  I would defer to them regarding need for biopsy, especially given her risk for complications related to chronic anticoagulation use and a mechanical valve. -Repeat UA ordered. -Ideally would like to have rheumatology weigh in.  -Could consider a skin biopsy of the petechial lesion, which would likely be the lowest risk location for a biopsy.  Best practice:  Per primary team  Labs   CBC: Recent Labs  Lab 05/26/19 0844 05/26/19 1735  WBC 4.2  --   NEUTROABS 3.5  --   HGB 6.4* 7.9*  HCT 20.3* 24.9*  MCV 98.1  --   PLT 155  --     Basic Metabolic Panel: Recent Labs  Lab 05/21/19 1033 05/26/19 0844  NA 133* 133*  K 4.0 4.2  CL 104 106  CO2 18* 20*  GLUCOSE 146* 117*  BUN 56* 53*  CREATININE 2.25* 1.94*  CALCIUM 8.5* 8.3*   GFR: Estimated Creatinine Clearance: 18.3 mL/min (A) (by C-G formula based on SCr of 1.94 mg/dL (H)). Recent Labs  Lab 05/26/19 0844 05/26/19 1735  PROCALCITON  --  <0.10  WBC 4.2  --     Liver Function Tests: Recent Labs  Lab 05/26/19 0844  AST 19  ALT 12  ALKPHOS 53  BILITOT 0.8  PROT 6.0*  ALBUMIN 2.6*   No results for input(s): LIPASE, AMYLASE in the last 168 hours. No results for input(s): AMMONIA in the last 168 hours.  ABG    Component Value Date/Time   PHART 7.417 04/04/2019 0527   PCO2ART 30.1 (L) 04/04/2019 0527   PO2ART 97.5 04/04/2019 0527   HCO3 19.1 (L) 04/04/2019 0527   TCO2 23 06/27/2018 1407   ACIDBASEDEF 4.7 (H) 04/04/2019 0527   O2SAT 98.0 04/04/2019 0527      Coagulation Profile: Recent Labs  Lab 05/26/19 1735  INR 3.7*    Cardiac Enzymes: No results for input(s): CKTOTAL, CKMB, CKMBINDEX, TROPONINI in the last 168 hours.  HbA1C: Hgb A1c MFr Bld  Date/Time Value Ref Range Status  02/24/2019 05:30 PM 4.6 (L) 4.8 - 5.6 % Final    Comment:    (NOTE) Pre diabetes:          5.7%-6.4% Diabetes:              >6.4% Glycemic control for   <7.0% adults with diabetes   10/06/2011 11:40 AM 6.4 (H) <5.7 % Final    Comment:    (NOTE)                                                                       According to the  ADA Clinical Practice Recommendations for 2011, when HbA1c is used as a screening test:  >=6.5%   Diagnostic of Diabetes Mellitus           (if abnormal result is confirmed) 5.7-6.4%   Increased risk of developing Diabetes Mellitus References:Diagnosis and Classification of Diabetes Mellitus,Diabetes [id number],9357,01(XBLTJ 1):S62-S69 and Standards of Medical Care in         Diabetes - 2011,Diabetes QZES,9233,00 (Suppl 1):S11-S61.    CBG: Recent Labs  Lab 05/26/19 1824  GLUCAP 171*    Review of Systems:   Positive for periodic epistaxis, chronic edema, and fatigue.  Otherwise, comprehensive review of systems is negative.  Past Medical History  She,  has a past medical history of Aortic atherosclerosis ([place]) (01/23/2018), Atrial fibrillation ([place]), Carotid artery disease ([place]), Cholelithiasis (01/23/2018), Chronic anticoagulation, Coronary artery disease, Diabetes mellitus, Hypercholesteremia, Hypertension, Mechanical heart valve present, Moderate to severe pulmonary hypertension ([place]), and Peripheral arterial disease ([place]).   Surgical History    Past Surgical History:  Procedure Laterality Date  . AORTIC VALVE REPLACEMENT    . CARDIAC CATHETERIZATION  11/10/2004   40% right common illiac, 70% in stent restenosis of distal left common illiac,   . CARDIAC CATHETERIZATION  05/18/2004   LAD 50-70% midstenosis, RCA  dominant w/50% stenosis, 50% Right common Illiac artery ostial stenosis, 90% in stent restenosis within midportion of left common illiac stent  . Carotid Duplex  03/12/2012   RSA-elev. velocities suggestive of a 50-69% diameter reduction, Right&Left Bulb/Prox ICA-mild-mod.fibrous plaqueelevating Velocities abnormal study.  . CHOLECYSTECTOMY N/A 03/01/2018   Procedure: LAPAROSCOPIC CHOLECYSTECTOMY;  Surgeon: Judeth Horn, MD;  Location: [facility];  Service: General;  Laterality: N/A;  . COLONOSCOPY WITH PROPOFOL N/A 01/22/2018   Procedure: COLONOSCOPY WITH PROPOFOL;  Surgeon: Wilford Corner, MD;  Location: [facility];  Service: Endoscopy;  Laterality: N/A;  . ESOPHAGOGASTRODUODENOSCOPY (EGD) WITH PROPOFOL N/A 01/22/2018   Procedure: ESOPHAGOGASTRODUODENOSCOPY (EGD) WITH PROPOFOL;  Surgeon: Wilford Corner, MD;  Location: [facility];  Service: Endoscopy;  Laterality: N/A;  . ESOPHAGOGASTRODUODENOSCOPY (EGD) WITH PROPOFOL N/A 11/21/2018   Procedure: ESOPHAGOGASTRODUODENOSCOPY (EGD) WITH PROPOFOL;  Surgeon: [name], MD;  Location: [facility];  Service: Gastroenterology;  Laterality: N/A;  . ESOPHAGOGASTRODUODENOSCOPY (EGD) WITH PROPOFOL Left 02/27/2019   Procedure: ESOPHAGOGASTRODUODENOSCOPY (EGD) WITH PROPOFOL;  Surgeon: Arta Silence, MD;  Location[facility] ENDOSCOPY;  Service: Endoscopy;  Laterality: Left;  . GIVENS CAPSULE STUDY N/A 11/21/2018   Procedure: GIVENS CAPSULE STUDY;  Surgeon: [name], MD;  Location: [place];  Service: Gastroenterology;  Laterality: N/A;  To be deployed during EGD  . Lower Ext. Duplex  03/12/2012   Right Proximal CIA- vessel narrowing w/elevated velocities 0-49% diameter reduction. Right SFA-mild mixed density plaque throughout vessel.  [name] NM MYOCAR PERF WALL MOTION  05/19/2010   protocol: Persantine, post stress EF 65%, negative for ischemia, low risk scan  . RIGHT HEART CATH N/A 06/27/2018   Procedure: RIGHT HEART CATH;  Surgeon: [name], MD;   Location: [facility];  Service: Cardiovascular;  Laterality: N/A;  . TRANSTHORACIC ECHOCARDIOGRAM  08/29/2012   Moderately calcified annulus of mitral valve, moderate regurg. of both mitral valve and tricuspid valve.      Social History   reports that she has quit smoking. Her smoking use included cigarettes. She has a 30.00 pack-year smoking history. She has never used smokeless tobacco. She reports that she does not drink alcohol or use drugs.   Family History   Her family history is negative for Breast cancer.  She has multiple family members with a history of heart disease.  Her mother, father, and sister died of cancers, although she is unsure which type.  Allergies Allergies  Allergen Reactions  . Flagyl [Metronidazole] Rash    ALL-OVER BODY RASH  . Coreg [Carvedilol] Other (See Comments)    Terrible cramping in the feet and had a lot of bowel movements, but not diarrhea  . Losartan Swelling    Patient doesn't recall site of swelling  . Verapamil Hives  . Zetia [Ezetimibe] Other (See Comments)    Reaction not recalled  . Zocor [Simvastatin - High Dose] Other (See Comments)    Reaction not recalled     Home Medications  Prior to Admission medications   Medication Sig Start Date End Date Taking? Authorizing Provider  acetaminophen (TYLENOL) 325 MG tablet Take 325 mg by mouth every 6 (six) hours as needed for mild pain.    Yes [provider]  allopurinol (ZYLOPRIM) 100 MG tablet Take 100 mg by mouth daily.   Yes [provider]  ALPRAZolam Duanne Moron) 0.5 MG tablet Take 0.25 mg by mouth at bedtime.    Yes [provider]  atorvastatin (LIPITOR) 80 MG tablet Take 1 tablet (80 mg total) by mouth daily at 6 PM. 11/22/18  Yes [name], MD  Cholecalciferol (VITAMIN D3) 1000 UNITS CAPS Take 1,000 Units by mouth daily with lunch.    Yes [provider]  Cyanocobalamin (VITAMIN B12) 1000 MCG TBCR Take 1,000 mcg by mouth daily with lunch.     Yes [provider]  diphenhydrAMINE-zinc acetate (BENADRYL) cream Apply topically 3 (three) times daily as needed for itching. 05/09/19  Yes Little Ishikawa, MD  furosemide (LASIX) 40 MG tablet Take 1.5 tablets (60 mg total) by mouth daily. 05/22/19  Yes [name], MD  hydrALAZINE (APRESOLINE) 100 MG tablet TAKE 1 TABLET (100 MG TOTAL) BY MOUTH 3 (THREE) TIMES DAILY. 01/20/19  Yes [name], MD  irbesartan (AVAPRO) 300 MG tablet Take 300 mg by mouth daily.   Yes [provider]  magnesium oxide (MAG-OX) 400 (241.3 Mg) MG tablet Take 1 tablet (400 mg total) by mouth daily. 01/30/18  Yes [name], MD  metFORMIN (GLUCOPHAGE) 500 MG tablet Take 500 mg by mouth 2 (two) times daily with a meal.    Yes [provider]  metoprolol tartrate (LOPRESSOR) 25 MG tablet Take 0.5 tablets (12.5 mg total) by mouth 2 (two) times daily. 03/05/19  Yes [place] Eric J, DO  ondansetron (ZOFRAN ODT) 4 MG disintegrating tablet Take 1 tablet (4 mg total) by mouth every 8 (eight) hours as needed for nausea or vomiting. 03/09/19  Yes [name], Kaitlyn E, PA-C  pantoprazole (PROTONIX) 40 MG tablet Take 40 mg by mouth 2 (two) times daily.  12/05/14  Yes [provider]  predniSONE (DELTASONE) 5 MG tablet Take 1 tablet (5 mg total) by mouth 2 (two) times daily with a meal. 05/22/19  Yes Magrinat, [name], MD  spironolactone (ALDACTONE) 25 MG tablet Take 25 mg by mouth daily.   Yes [provider]  tadalafil, PAH, (ADCIRCA) 20 MG tablet Take 2 tablets (40 mg total) by mouth daily. 05/15/19  Yes [name], MD  vitamin C (ASCORBIC ACID) 500 MG tablet Take 500 mg by mouth daily with lunch.   Yes [provider]  warfarin (COUMADIN) 5 MG tablet Take 5-7.5 mg by mouth daily. Take 1.5 tablets on Tues, Thursday, Saturday and Sunday.  Take 1 tablet on Mondays, Wednesdays, and Fridays. 04/26/19  Yes [provider]  feeding supplement, GLUCERNA SHAKE,  (GLUCERNA SHAKE) LIQD Take 237 mLs by mouth 2 (two) times daily between meals. Patient not taking: Reported on 05/08/2019 04/07/19   [name], MD  predniSONE (DELTASONE) 5 MG tablet Take 2 tablets (10 mg total) by mouth daily with breakfast. 05/19/19   Magrinat, [name], MD     Julian Hy, DO 05/26/19 8:50 PM [facility]

## 2019-05-26 NOTE — H&P (Signed)
History and Physical    [name] QVZ:[id number] DOB: 11-Apr-1941 DOA: 05/26/2019  PCP: [name], MD  Patient coming from: home  I have personally briefly reviewed patient's old medical records in [place]  Chief Complaint: SOB,   HPI: [name] is a 78 y.o. female with medical history significant of mechanical aortic valve on warfarin, pulmonary hypertension, CAD, diabetes, HTN, HLD and multiple other medical problems presenting after developing SOB overnight.  She notes she was in her regular state of health on 8/16, but overnight she woke up short of breath.  She denies CP, fever, chills.  She does not orthopnea and PND.  She notes she had some abdominal discomfort, nausea which has since resolved.  No vomiting.  Her stool is dark, she suspects due to iron.  She was recently discharged on 7/31 after developing fevers, chills, and rash.  She received vanc/cefepime, doxycycline and was discharged with steroid taper.  She notes she took a few days of doxy at home, but it does not appear this was prescribed at discharge on review of the d/c summary.    ED Course: Labs, imaging, EKG.  Admit for anemia and HF.  Review of Systems: As per HPI otherwise 10 point review of systems negative.   Past Medical History:  Diagnosis Date  . Aortic atherosclerosis ([place]) 01/23/2018  . Atrial fibrillation ([place])   . Carotid artery disease ([place])   . Cholelithiasis 01/23/2018  . Chronic anticoagulation   . Coronary artery disease    status post coronary artery bypass grafting times 07/10/2004  . Diabetes mellitus   . Hypercholesteremia   . Hypertension   . Mechanical heart valve present    H. aortic valve replacement at the time of bypass surgery October 2005  . Moderate to severe pulmonary hypertension ([place])   . Peripheral arterial disease ([facility])    history of left common iliac artery PTA and stenting for a chronic total occlusion 08/26/01    Past Surgical History:   Procedure Laterality Date  . AORTIC VALVE REPLACEMENT    . CARDIAC CATHETERIZATION  11/10/2004   40% right common illiac, 70% in stent restenosis of distal left common illiac,   . CARDIAC CATHETERIZATION  05/18/2004   LAD 50-70% midstenosis, RCA dominant w/50% stenosis, 50% Right common Illiac artery ostial stenosis, 90% in stent restenosis within midportion of left common illiac stent  . Carotid Duplex  03/12/2012   RSA-elev. velocities suggestive of a 50-69% diameter reduction, Right&Left Bulb/Prox ICA-mild-mod.fibrous plaqueelevating Velocities abnormal study.  . CHOLECYSTECTOMY N/A 03/01/2018   Procedure: LAPAROSCOPIC CHOLECYSTECTOMY;  Surgeon: Judeth Horn, MD;  Location: [place];  Service: General;  Laterality: N/A;  . COLONOSCOPY WITH PROPOFOL N/A 01/22/2018   Procedure: COLONOSCOPY WITH PROPOFOL;  Surgeon: Wilford Corner, MD;  Location: [place];  Service: Endoscopy;  Laterality: N/A;  . ESOPHAGOGASTRODUODENOSCOPY (EGD) WITH PROPOFOL N/A 01/22/2018   Procedure: ESOPHAGOGASTRODUODENOSCOPY (EGD) WITH PROPOFOL;  Surgeon: Wilford Corner, MD;  Location: [place];  Service: Endoscopy;  Laterality: N/A;  . ESOPHAGOGASTRODUODENOSCOPY (EGD) WITH PROPOFOL N/A 11/21/2018   Procedure: ESOPHAGOGASTRODUODENOSCOPY (EGD) WITH PROPOFOL;  Surgeon: [name], MD;  Location: [place];  Service: Gastroenterology;  Laterality: N/A;  . ESOPHAGOGASTRODUODENOSCOPY (EGD) WITH PROPOFOL Left 02/27/2019   Procedure: ESOPHAGOGASTRODUODENOSCOPY (EGD) WITH PROPOFOL;  Surgeon: Arta Silence, MD;  Location: [facility] END[facility];  Service: Endoscopy;  Laterality: Left;  . GIVENS CAPSULE STUDY N/A 11/21/2018   Procedure: GIVENS CAPSULE STUDY;  Surgeon: [name], MD;  Location: [place];  Service: Gastroenterology;  Laterality: N/A;  To be deployed during EGD  . Lower Ext. Duplex  03/12/2012   Right Proximal CIA- vessel narrowing w/elevated velocities 0-49% diameter reduction. Right SFA-mild mixed density  plaque throughout vessel.  [name] NM MYOCAR PERF WALL MOTION  05/19/2010   protocol: Persantine, post stress EF 65%, negative for ischemia, low risk scan  . RIGHT HEART CATH N/A 06/27/2018   Procedure: RIGHT HEART CATH;  Surgeon: [name], MD;  Location: [place] [facility];  Service: Cardiovascular;  Laterality: N/A;  . TRANSTHORACIC ECHOCARDIOGRAM  08/29/2012   Moderately calcified annulus of mitral valve, moderate regurg. of both mitral valve and tricuspid valve.      reports that she has quit smoking. Her smoking use included cigarettes. She has a 30.00 pack-year smoking history. She has never used smokeless tobacco. She reports that she does not drink alcohol or use drugs.  Allergies  Allergen Reactions  . Flagyl [Metronidazole] Rash    ALL-OVER BODY RASH  . Coreg [Carvedilol] Other (See Comments)    Terrible cramping in the feet and had a lot of bowel movements, but not diarrhea  . Losartan Swelling    Patient doesn't recall site of swelling  . Verapamil Hives  . Zetia [Ezetimibe] Other (See Comments)    Reaction not recalled  . Zocor [Simvastatin - High Dose] Other (See Comments)    Reaction not recalled    Family History  Problem Relation Age of Onset  . Breast cancer Neg Hx    Prior to Admission medications   Medication Sig Start Date End Date Taking? Authorizing Provider  acetaminophen (TYLENOL) 325 MG tablet Take 325 mg by mouth every 6 (six) hours as needed for mild pain.    Yes [provider]  allopurinol (ZYLOPRIM) 100 MG tablet Take 100 mg by mouth daily.   Yes [provider]  ALPRAZolam Duanne Moron) 0.5 MG tablet Take 0.25 mg by mouth at bedtime.    Yes [provider]  atorvastatin (LIPITOR) 80 MG tablet Take 1 tablet (80 mg total) by mouth daily at 6 PM. 11/22/18  Yes [name], MD  Cholecalciferol (VITAMIN D3) 1000 UNITS CAPS Take 1,000 Units by mouth daily with lunch.    Yes [provider]  Cyanocobalamin (VITAMIN B12)  1000 MCG TBCR Take 1,000 mcg by mouth daily with lunch.    Yes [provider]  diphenhydrAMINE-zinc acetate (BENADRYL) cream Apply topically 3 (three) times daily as needed for itching. 05/09/19  Yes Little Ishikawa, MD  furosemide (LASIX) 40 MG tablet Take 1.5 tablets (60 mg total) by mouth daily. 05/22/19  Yes [name], MD  hydrALAZINE (APRESOLINE) 100 MG tablet TAKE 1 TABLET (100 MG TOTAL) BY MOUTH 3 (THREE) TIMES DAILY. 01/20/19  Yes [name], MD  irbesartan (AVAPRO) 300 MG tablet Take 300 mg by mouth daily.   Yes [provider]  magnesium oxide (MAG-OX) 400 (241.3 Mg) MG tablet Take 1 tablet (400 mg total) by mouth daily. 01/30/18  Yes [name], MD  metFORMIN (GLUCOPHAGE) 500 MG tablet Take 500 mg by mouth 2 (two) times daily with a meal.    Yes [provider]  metoprolol tartrate (LOPRESSOR) 25 MG tablet Take 0.5 tablets (12.5 mg total) by mouth 2 (two) times daily. 03/05/19  Yes [place] Eric J, DO  ondansetron (ZOFRAN ODT) 4 MG disintegrating tablet Take 1 tablet (4 mg total) by mouth every 8 (eight) hours as needed for nausea or vomiting.  03/09/19  Yes [name], Kaitlyn E, PA-C  pantoprazole (PROTONIX) 40 MG tablet Take 40 mg by mouth 2 (two) times daily.  12/05/14  Yes [provider]  predniSONE (DELTASONE) 5 MG tablet Take 1 tablet (5 mg total) by mouth 2 (two) times daily with a meal. 05/22/19  Yes Magrinat, [name], MD  spironolactone (ALDACTONE) 25 MG tablet Take 25 mg by mouth daily.   Yes [provider]  tadalafil, PAH, (ADCIRCA) 20 MG tablet Take 2 tablets (40 mg total) by mouth daily. 05/15/19  Yes [name], MD  vitamin C (ASCORBIC ACID) 500 MG tablet Take 500 mg by mouth daily with lunch.   Yes [provider]  warfarin (COUMADIN) 5 MG tablet Take 5-7.5 mg by mouth daily. Take 1.5 tablets on Tues, Thursday, Saturday and Sunday. Take 1 tablet on Mondays, Wednesdays, and Fridays. 04/26/19  Yes  [provider]  feeding supplement, GLUCERNA SHAKE, (GLUCERNA SHAKE) LIQD Take 237 mLs by mouth 2 (two) times daily between meals. Patient not taking: Reported on 05/08/2019 04/07/19   [name], MD  predniSONE (DELTASONE) 5 MG tablet Take 2 tablets (10 mg total) by mouth daily with breakfast. 05/19/19   Magrinat, [name], MD    Physical Exam: Vitals:   05/26/19 1158 05/26/19 1231 05/26/19 1351 05/26/19 1521  BP: (!) 179/57 (!) 179/52 (!) 178/55 (!) 190/52  Pulse: 76 74 76 80  Resp: 20 (!) 24 20 (!) 22  Temp: 98 F (36.7 C) 98.1 F (36.7 C) 98 F (36.7 C) 98.8 F (37.1 C)  TempSrc: Oral Oral Oral Oral  SpO2: 94% 95% 96% 94%  Weight:   52.2 kg   Height:   5\' 2"  (1.575 m)     Constitutional: NAD, calm, comfortable Vitals:   05/26/19 1158 05/26/19 1231 05/26/19 1351 05/26/19 1521  BP: (!) 179/57 (!) 179/52 (!) 178/55 (!) 190/52  Pulse: 76 74 76 80  Resp: 20 (!) 24 20 (!) 22  Temp: 98 F (36.7 C) 98.1 F (36.7 C) 98 F (36.7 C) 98.8 F (37.1 C)  TempSrc: Oral Oral Oral Oral  SpO2: 94% 95% 96% 94%  Weight:   52.2 kg   Height:   5\' 2"  (1.575 m)    Eyes: PERRL, lids and conjunctivae normal ENMT: Mucous membranes are moist. Posterior pharynx clear of any exudate or lesions.Normal dentition.  Neck: normal, supple, no masses, no thyromegaly Respiratory: bibasilar crackles Cardiovascular: Regular rate and rhythm, no murmurs / rubs / gallops. Bilateral LE edema. Abdomen: no tenderness, no masses palpated. No hepatosplenomegaly. Bowel sounds positive.  Musculoskeletal: no clubbing / cyanosis. No joint deformity upper and lower extremities. Good ROM, no contractures. Normal muscle tone.  Skin: no rashes, lesions, ulcers. No induration Neurologic: CN 2-12 grossly intact. Sensation intact.  Moving all extremities.  Psychiatric: Normal judgment and insight. Alert and oriented x 3. Normal mood.   Labs on Admission: I have personally reviewed following labs and imaging  studies  CBC: Recent Labs  Lab 05/26/19 0844  WBC 4.2  NEUTROABS 3.5  HGB 6.4*  HCT 20.3*  MCV 98.1  PLT 706   Basic Metabolic Panel: Recent Labs  Lab 05/21/19 1033 05/26/19 0844  NA 133* 133*  K 4.0 4.2  CL 104 106  CO2 18* 20*  GLUCOSE 146* 117*  BUN 56* 53*  CREATININE 2.25* 1.94*  CALCIUM 8.5* 8.3*   GFR: Estimated Creatinine Clearance: 18.3 mL/min (A) (by C-G formula based on SCr of 1.94 mg/dL (H)).  Liver Function Tests: Recent Labs  Lab 05/26/19 0844  AST 19  ALT 12  ALKPHOS 53  BILITOT 0.8  PROT 6.0*  ALBUMIN 2.6*   No results for input(s): LIPASE, AMYLASE in the last 168 hours. No results for input(s): AMMONIA in the last 168 hours. Coagulation Profile: No results for input(s): INR, PROTIME in the last 168 hours. Cardiac Enzymes: No results for input(s): CKTOTAL, CKMB, CKMBINDEX, TROPONINI in the last 168 hours. BNP (last 3 results) No results for input(s): PROBNP in the last 8760 hours. HbA1C: No results for input(s): HGBA1C in the last 72 hours. CBG: No results for input(s): GLUCAP in the last 168 hours. Lipid Profile: No results for input(s): CHOL, HDL, LDLCALC, TRIG, CHOLHDL, LDLDIRECT in the last 72 hours. Thyroid Function Tests: No results for input(s): TSH, T4TOTAL, FREET4, T3FREE, THYROIDAB in the last 72 hours. Anemia Panel: No results for input(s): VITAMINB12, FOLATE, FERRITIN, TIBC, IRON, RETICCTPCT in the last 72 hours. Urine analysis:    Component Value Date/Time   COLORURINE YELLOW 05/08/2019 0715   APPEARANCEUR HAZY (A) 05/08/2019 0715   LABSPEC 1.010 05/08/2019 0715   PHURINE 5.0 05/08/2019 0715   GLUCOSEU NEGATIVE 05/08/2019 0715   HGBUR MODERATE (A) 05/08/2019 0715   BILIRUBINUR NEGATIVE 05/08/2019 0715   KETONESUR NEGATIVE 05/08/2019 0715   PROTEINUR 100 (A) 05/08/2019 0715   UROBILINOGEN 1.0 10/06/2011 1709   NITRITE NEGATIVE 05/08/2019 0715   LEUKOCYTESUR TRACE (A) 05/08/2019 0715    Radiological Exams on  Admission: Dg Chest Portable 1 View  Result Date: 05/26/2019 CLINICAL DATA:  Weakness and shortness of breath since last night. EXAM: PORTABLE CHEST 1 VIEW COMPARISON:  05/08/2019. FINDINGS: Stable enlarged cardiac silhouette and post CABG changes. Increased diffuse prominence of the interstitial markings, especially on the right, with probable mild interspersed airspace opacities. Kerley lines are noted at the right lung base. No pleural fluid is seen. Bilateral calcified granulomata are again noted. Also noted are coarse left breast calcifications compatible with degenerated fibroadenomata. Diffuse osteopenia. Mild to moderate left and minimal right glenohumeral joint degenerative changes. IMPRESSION: 1. Worsening pulmonary edema or pneumonia/pneumonitis. 2. Stable cardiomegaly. Electronically Signed   By: Claudie Revering M.D.   On: 05/26/2019 09:01    EKG: Independently reviewed. Sinus, LPFB.  Compared to priors.  Assessment/Plan Active Problems:   Anemia  Acute Hypoxic Respiratory Failure: multifactorial with pulmonary edema 2/2 diastolic HF and her anemia.  Requiring 3 L [place], on no O2 at baseline.  Of note, she has positive  P ANCA (1:640), positive MPO Abs and ANCA proteinase 3.  With edema vs pneumonitis on imaging, concern for vasculitis with positive anca.  Negative COVID 19. Will consult pulmonology in setting positive ANCA. HF and anemia as below  HFpEF Exacberation: EF from 7/29 was 60-65%, she has elevated PASP of 34 mmHg.  See report.  CXR with pulmonary edema vs pneumonia or pneumonitis, but no infectious sx and elevated BNP.  Suspect this is likely HF, though as noted above, concern for vasculitis as well. Lasix 60 IV BID (she's on lasix 60 PO daily) Strict I/O, daily weights Follows with Dr. Aundra Dubin outpatient, they've planning to f/u PYP scan outpatient  Petechial rash  RMSF: pt presented late July with fever and petechial rash.  She was treated initially with doxycycline, but it  does not sound like this course was completed.  She attributes the rash to the injections she's been getting for her Hb (sounds like aranesp).  Per Dr. [name] note, this was  thought to be less likely.  Pt also noted to have positive IgM for RMSF.  Will treat with doxycycline.  Continue steroids per heme.  Anemia of Chronic Disease  Thrombocytopenia: Hb down to 6.4 today from 7.6 on 8/10.  Discussed with Dr. Jana Hakim who's planning to see her while she's inpatient.  We discussed ordering a bone marrow bx, but this was before I saw her ANCA ab.  Will hold off for now.  She receives feraheme and aranesp with Dr. Jana Hakim as an outpatient. Transfuse 1 unit pRBC Follow post transfusion H/H She notes dark stools, follow hemoccult   ANCA Positive  Concern for vasculitis: presented in late July with fever, rash.  P ANCA 1:640, MPO and ANCA Proteinase 3 positive.  Given edema vs pneumonitis on imaging, c/s pulm as noted above.   Consider renal consult pending UA Will increase steroids to 40 mg PO daily  Mechanical Aortic Valve  Chronic Atrial Fibrillation: continue warfarin with INR goal 2.5-3.5.   Warfarin per pharmacy  CKD IV: creatinine appears to be close to baseline around 1.7-1.8.  1.94 today.  Follow with diuresis. Follow UA and consider renal c/s with concern for ANCA vasculitis  Hypertension: continue metoprolol, spironolactone, lasix IV.  Hold irbesartan.  CAD: continue lipitor, metoprolol  Anxiety: continue xanax  GERD  Hx GI Bleed: continue PPI BID  Gout: continue allopurinol  T2DM: hold metformin, start SSI  Pulmonary HTN: continue tadalafil  DVT prophylaxis: warfarin  Code Status: full  Family Communication: none at bedside  Disposition Plan: pending  Consults called: Pulmonology, hematology Admission status: observation    Fayrene Helper MD [facility] Pager AMION  If 7PM-7AM, please contact night-coverage [email or web address] Password [facility]  05/26/2019,  3:32 PM

## 2019-05-26 NOTE — ED Notes (Signed)
X-ray at bedside

## 2019-05-26 NOTE — ED Provider Notes (Signed)
[facility] DEPT Provider Note   CSN: [id number] Arrival date & time: 05/26/19  7124    History   Chief Complaint Chief Complaint  Patient presents with  . Shortness of Breath    HPI [name] is a 78 y.o. female.     HPI Patient presents with shortness of breath.  Started last night.  Sats of 78% on room air.  Not on oxygen at baseline.  History of anemia and thinks she may need blood.  No chest pain.  No cough.  Has some swelling of legs.  Does have history of atrial fibrillation and mechanical heart valve.  Is on chronic anticoagulation.  She is scheduled for blood transfusion in a week.  No cough.  No fevers. Past Medical History:  Diagnosis Date  . Aortic atherosclerosis ([place]) 01/23/2018  . Atrial fibrillation ([place])   . Carotid artery disease ([place])   . Cholelithiasis 01/23/2018  . Chronic anticoagulation   . Coronary artery disease    status post coronary artery bypass grafting times 07/10/2004  . Diabetes mellitus   . Hypercholesteremia   . Hypertension   . Mechanical heart valve present    H. aortic valve replacement at the time of bypass surgery October 2005  . Moderate to severe pulmonary hypertension ([place])   . Peripheral arterial disease (HCC)    history of left common iliac artery PTA and stenting for a chronic total occlusion 08/26/01    Patient Active Problem List   Diagnosis Date Noted  . Renal insufficiency 05/08/2019  . Acute on chronic diastolic CHF (congestive heart failure) ([place]) 04/07/2019  . Left leg cellulitis 04/07/2019  . PAF (paroxysmal atrial fibrillation) ([place]) 04/07/2019  . Iron deficiency anemia 02/28/2019  . 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 ([place]) 02/28/2019  . CKD (chronic kidney disease) stage 3, GFR 30-59 ml/min (HCC) 02/24/2019  . Malnutrition of moderate degree 03/12/2018  . Acute on chronic diastolic heart failure ([place]) 03/06/2018  . Diabetes mellitus type 2, controlled  ([place]) 03/06/2018  . S/P cholecystectomy 03/06/2018  . HTN (hypertension) 03/06/2018  . H/O mechanical aortic valve replacement 03/06/2018  . Acute hyponatremia 03/06/2018  . Acute respiratory failure with hypoxia ([place]) 03/06/2018  . Moderate to severe pulmonary hypertension ([place]) 03/06/2018  . UTI (urinary tract infection) 02/22/2018  . Cholecystitis 02/21/2018  . GERD (gastroesophageal reflux disease) 02/21/2018  . Anxiety 02/21/2018  . Chronic diastolic CHF (congestive heart failure) ([place]) 02/21/2018  . Diarrhea 02/21/2018  . Rash 02/21/2018  . General weakness 02/02/2018  . Macular rash 01/25/2018  . Thrombocytopenia ([place]) 01/25/2018  . Arm pain, diffuse 01/25/2018  . Anemia 01/23/2018  . Cholelithiasis 01/23/2018  . Aortic atherosclerosis ([place]) 01/23/2018  . GI bleed 01/18/2018  . Coronary artery disease 08/18/2013  . Carotid artery disease ([place]) 08/18/2013  . Peripheral arterial disease ([place]) 08/18/2013  . Diabetes mellitus without complication ([place]) 58/06/9832  . Benign positional vertigo 10/06/2011  . Hyponatremia 10/06/2011  . Influenza 10/06/2011  . Sinusitis 10/06/2011  . Hypertension   . Hypercholesteremia   . Mechanical heart valve present   . Chronic anticoagulation     Past Surgical History:  Procedure Laterality Date  . AORTIC VALVE REPLACEMENT    . CARDIAC CATHETERIZATION  11/10/2004   40% right common illiac, 70% in stent restenosis of distal left common illiac,   . CARDIAC CATHETERIZATION  05/18/2004   LAD 50-70% midstenosis, RCA dominant w/50% stenosis, 50% Right common Illiac artery ostial stenosis, 90% in stent  restenosis within midportion of left common illiac stent  . Carotid Duplex  03/12/2012   RSA-elev. velocities suggestive of a 50-69% diameter reduction, Right&Left Bulb/Prox ICA-mild-mod.fibrous plaqueelevating Velocities abnormal study.  . CHOLECYSTECTOMY N/A 03/01/2018   Procedure: LAPAROSCOPIC CHOLECYSTECTOMY;  Surgeon: Judeth Horn, MD;   Location: [facility];  Service: General;  Laterality: N/A;  . COLONOSCOPY WITH PROPOFOL N/A 01/22/2018   Procedure: COLONOSCOPY WITH PROPOFOL;  Surgeon: Wilford Corner, MD;  Location: [facility];  Service: Endoscopy;  Laterality: N/A;  . ESOPHAGOGASTRODUODENOSCOPY (EGD) WITH PROPOFOL N/A 01/22/2018   Procedure: ESOPHAGOGASTRODUODENOSCOPY (EGD) WITH PROPOFOL;  Surgeon: Wilford Corner, MD;  Location: [facility];  Service: Endoscopy;  Laterality: N/A;  . ESOPHAGOGASTRODUODENOSCOPY (EGD) WITH PROPOFOL N/A 11/21/2018   Procedure: ESOPHAGOGASTRODUODENOSCOPY (EGD) WITH PROPOFOL;  Surgeon: [name], MD;  Location: [facility];  Service: Gastroenterology;  Laterality: N/A;  . ESOPHAGOGASTRODUODENOSCOPY (EGD) WITH PROPOFOL Left 02/27/2019   Procedure: ESOPHAGOGASTRODUODENOSCOPY (EGD) WITH PROPOFOL;  Surgeon: Arta Silence, MD;  Location: [facility] ENDOSCOPY;  Service: Endoscopy;  Laterality: Left;  . GIVENS CAPSULE STUDY N/A 11/21/2018   Procedure: GIVENS CAPSULE STUDY;  Surgeon: [name], MD;  Location: [facility];  Service: Gastroenterology;  Laterality: N/A;  To be deployed during EGD  . Lower Ext. Duplex  03/12/2012   Right Proximal CIA- vessel narrowing w/elevated velocities 0-49% diameter reduction. Right SFA-mild mixed density plaque throughout vessel.  [name] NM MYOCAR PERF WALL MOTION  05/19/2010   protocol: Persantine, post stress EF 65%, negative for ischemia, low risk scan  . RIGHT HEART CATH N/A 06/27/2018   Procedure: RIGHT HEART CATH;  Surgeon: [name], MD;  Location: [facility];  Service: Cardiovascular;  Laterality: N/A;  . TRANSTHORACIC ECHOCARDIOGRAM  08/29/2012   Moderately calcified annulus of mitral valve, moderate regurg. of both mitral valve and tricuspid valve.      OB History   No obstetric history on file.      Home Medications    Prior to Admission medications   Medication Sig Start Date End Date Taking? Authorizing Provider  acetaminophen (TYLENOL) 325  MG tablet Take 325 mg by mouth every 6 (six) hours as needed for mild pain.    Yes [provider]  allopurinol (ZYLOPRIM) 100 MG tablet Take 100 mg by mouth daily.   Yes [provider]  ALPRAZolam Duanne Moron) 0.5 MG tablet Take 0.25 mg by mouth at bedtime.    Yes [provider]  atorvastatin (LIPITOR) 80 MG tablet Take 1 tablet (80 mg total) by mouth daily at 6 PM. 11/22/18  Yes [name], MD  Cholecalciferol (VITAMIN D3) 1000 UNITS CAPS Take 1,000 Units by mouth daily with lunch.    Yes [provider]  Cyanocobalamin (VITAMIN B12) 1000 MCG TBCR Take 1,000 mcg by mouth daily with lunch.    Yes [provider]  diphenhydrAMINE-zinc acetate (BENADRYL) cream Apply topically 3 (three) times daily as needed for itching. 05/09/19  Yes Little Ishikawa, MD  furosemide (LASIX) 40 MG tablet Take 1.5 tablets (60 mg total) by mouth daily. 05/22/19  Yes [name], MD  hydrALAZINE (APRESOLINE) 100 MG tablet TAKE 1 TABLET (100 MG TOTAL) BY MOUTH 3 (THREE) TIMES DAILY. 01/20/19  Yes [name], MD  irbesartan (AVAPRO) 300 MG tablet Take 300 mg by mouth daily.   Yes [provider]  magnesium oxide (MAG-OX) 400 (241.3 Mg) MG tablet Take 1 tablet (400 mg total) by mouth daily. 01/30/18  Yes [name], MD  metFORMIN (GLUCOPHAGE)  500 MG tablet Take 500 mg by mouth 2 (two) times daily with a meal.    Yes [provider]  metoprolol tartrate (LOPRESSOR) 25 MG tablet Take 0.5 tablets (12.5 mg total) by mouth 2 (two) times daily. 03/05/19  Yes [place] Eric J, DO  ondansetron (ZOFRAN ODT) 4 MG disintegrating tablet Take 1 tablet (4 mg total) by mouth every 8 (eight) hours as needed for nausea or vomiting. 03/09/19  Yes [name], Kaitlyn E, PA-C  pantoprazole (PROTONIX) 40 MG tablet Take 40 mg by mouth 2 (two) times daily.  12/05/14  Yes [provider]  predniSONE (DELTASONE) 5 MG tablet Take 1 tablet (5 mg total) by mouth 2  (two) times daily with a meal. 05/22/19  Yes Magrinat, [name], MD  spironolactone (ALDACTONE) 25 MG tablet Take 25 mg by mouth daily.   Yes [provider]  tadalafil, PAH, (ADCIRCA) 20 MG tablet Take 2 tablets (40 mg total) by mouth daily. 05/15/19  Yes [name], MD  vitamin C (ASCORBIC ACID) 500 MG tablet Take 500 mg by mouth daily with lunch.   Yes [provider]  warfarin (COUMADIN) 5 MG tablet Take 5-7.5 mg by mouth daily. Take 1.5 tablets on Tues, Thursday, Saturday and Sunday. Take 1 tablet on Mondays, Wednesdays, and Fridays. 04/26/19  Yes [provider]  feeding supplement, GLUCERNA SHAKE, (GLUCERNA SHAKE) LIQD Take 237 mLs by mouth 2 (two) times daily between meals. Patient not taking: Reported on 05/08/2019 04/07/19   [name], MD  predniSONE (DELTASONE) 5 MG tablet Take 2 tablets (10 mg total) by mouth daily with breakfast. 05/19/19   Magrinat, [name], MD    Family History Family History  Problem Relation Age of Onset  . Breast cancer Neg Hx     Social History Social History   Tobacco Use  . Smoking status: Former Smoker    Packs/day: 1.00    Years: 30.00    Pack years: 30.00    Types: Cigarettes  . Smokeless tobacco: Never Used  . Tobacco comment: quit smoking 2005  Substance Use Topics  . Alcohol use: No    Alcohol/week: 0.0 standard drinks  . Drug use: No     Allergies   Flagyl [metronidazole], Coreg [carvedilol], Losartan, Verapamil, Zetia [ezetimibe], and Zocor [simvastatin - high dose]   Review of Systems Review of Systems  Constitutional: Negative for appetite change.  HENT: Negative for dental problem.   Respiratory: Positive for shortness of breath. Negative for cough.   Cardiovascular: Positive for leg swelling.  Gastrointestinal: Negative for abdominal pain.  Genitourinary: Negative for flank pain.  Musculoskeletal: Negative for back pain.  Skin: Negative for rash.  Neurological: Negative for weakness.   Psychiatric/Behavioral: Negative for confusion.     Physical Exam Updated Vital Signs BP (!) 178/55 (BP Location: Right Arm)   Pulse 76   Temp 98 F (36.7 C) (Oral)   Resp 20   Ht 5\' 2"  (1.575 m)   Wt 52.2 kg   SpO2 96%   BMI 21.05 kg/m   Physical Exam Vitals signs and nursing note reviewed.  HENT:     Head: Normocephalic.  Neck:     Musculoskeletal: Neck supple.  Cardiovascular:     Rate and Rhythm: Normal rate and regular rhythm.  Pulmonary:     Comments: Mild tachypnea.  No rales. Chest:     Chest wall: No tenderness.  Abdominal:     Palpations: There is no splenomegaly.  Musculoskeletal:  Right lower leg: Edema present.     Left lower leg: Edema present.     Comments: Pitting edema bilateral lower extremities.  Skin:    General: Skin is warm.     Capillary Refill: Capillary refill takes less than 2 seconds.  Neurological:     Mental Status: She is alert.      ED Treatments / Results  Labs (all labs ordered are listed, but only abnormal results are displayed) Labs Reviewed  CBC WITH DIFFERENTIAL/PLATELET - Abnormal; Notable for the following components:      Result Value   RBC 2.07 (*)    Hemoglobin 6.4 (*)    HCT 20.3 (*)    RDW 18.7 (*)    Lymphs Abs 0.4 (*)    All other components within normal limits  COMPREHENSIVE METABOLIC PANEL - Abnormal; Notable for the following components:   Sodium 133 (*)    CO2 20 (*)    Glucose, Bld 117 (*)    BUN 53 (*)    Creatinine, Ser 1.94 (*)    Calcium 8.3 (*)    Total Protein 6.0 (*)    Albumin 2.6 (*)    GFR calc non Af Amer 24 (*)    GFR calc Af Amer 28 (*)    All other components within normal limits  BRAIN NATRIURETIC PEPTIDE - Abnormal; Notable for the following components:   B Natriuretic Peptide 395.1 (*)    All other components within normal limits  SARS CORONAVIRUS 2 (HOSPITAL ORDER, [facility])  PROCALCITONIN  HEMOGLOBIN AND HEMATOCRIT, BLOOD  IRON AND TIBC   FERRITIN  VITAMIN B12  FOLATE  OCCULT BLOOD X 1 CARD TO LAB, STOOL  PROTIME-INR  TYPE AND SCREEN  ABO/RH  PREPARE RBC (CROSSMATCH)  TROPONIN I (HIGH SENSITIVITY)  TROPONIN I (HIGH SENSITIVITY)    EKG None  Radiology Dg Chest Portable 1 View  Result Date: 05/26/2019 CLINICAL DATA:  Weakness and shortness of breath since last night. EXAM: PORTABLE CHEST 1 VIEW COMPARISON:  05/08/2019. FINDINGS: Stable enlarged cardiac silhouette and post CABG changes. Increased diffuse prominence of the interstitial markings, especially on the right, with probable mild interspersed airspace opacities. Kerley lines are noted at the right lung base. No pleural fluid is seen. Bilateral calcified granulomata are again noted. Also noted are coarse left breast calcifications compatible with degenerated fibroadenomata. Diffuse osteopenia. Mild to moderate left and minimal right glenohumeral joint degenerative changes. IMPRESSION: 1. Worsening pulmonary edema or pneumonia/pneumonitis. 2. Stable cardiomegaly. Electronically Signed   By: Claudie Revering M.D.   On: 05/26/2019 09:01    Procedures Procedures (including critical care time)  Medications Ordered in ED Medications  0.9 %  sodium chloride infusion (Manually program via Guardrails IV Fluids) (has no administration in time range)  acetaminophen (TYLENOL) tablet 650 mg (has no administration in time range)    Or  acetaminophen (TYLENOL) suppository 650 mg (has no administration in time range)  polyethylene glycol (MIRALAX / GLYCOLAX) packet 17 g (has no administration in time range)  allopurinol (ZYLOPRIM) tablet 100 mg (has no administration in time range)  atorvastatin (LIPITOR) tablet 80 mg (has no administration in time range)  hydrALAZINE (APRESOLINE) tablet 100 mg (has no administration in time range)  metoprolol tartrate (LOPRESSOR) tablet 12.5 mg (has no administration in time range)  spironolactone (ALDACTONE) tablet 25 mg (has no administration  in time range)  tadalafil (CIALIS) tablet 40 mg (has no administration in time range)  ALPRAZolam (XANAX) tablet  0.25 mg (has no administration in time range)  predniSONE (DELTASONE) tablet 5 mg (has no administration in time range)  pantoprazole (PROTONIX) EC tablet 40 mg (has no administration in time range)  vitamin B-12 (CYANOCOBALAMIN) tablet 1,000 mcg (has no administration in time range)  cholecalciferol (VITAMIN D) tablet 1,000 Units (has no administration in time range)  magnesium oxide (MAG-OX) tablet 400 mg (has no administration in time range)  vitamin C (ASCORBIC ACID) tablet 500 mg (has no administration in time range)  doxycycline (VIBRA-TABS) tablet 100 mg (has no administration in time range)  furosemide (LASIX) injection 80 mg (80 mg Intravenous Given 05/26/19 1148)     Initial Impression / Assessment and Plan / ED Course  I have reviewed the triage vital signs and the nursing notes.  Pertinent labs & imaging results that were available during my care of the patient were reviewed by me and considered in my medical decision making (see chart for details).       Patient presented with shortness of breath.  History of anemia that appears to be worsened.  This could be the cause of the hypoxia.  No fevers.  No cough.  Requires oxygen now however.  History of CHF and has a mechanical aortic valve.  Edema on her legs and BNP is somewhat elevated.  X-ray shows pulmonary edema versus pneumonia pneumonitis.  Worsened from prior.  Not clearly a pneumonia however.  Will admit to hospitalist will require transfusion.  CRITICAL CARE Performed by: Davonna Belling Total critical care time: 30 minutes Critical care time was exclusive of separately billable procedures and treating other patients. Critical care was necessary to treat or prevent imminent or life-threatening deterioration. Critical care was time spent personally by me on the following activities: development of treatment  plan with patient and/or surrogate as well as nursing, discussions with consultants, evaluation of patient's response to treatment, examination of patient, obtaining history from patient or surrogate, ordering and performing treatments and interventions, ordering and review of laboratory studies, ordering and review of radiographic studies, pulse oximetry and re-evaluation of patient's condition.   Final Clinical Impressions(s) / ED Diagnoses   Final diagnoses:  Anemia, unspecified type  Acute respiratory failure with hypoxia [facility])    ED Discharge Orders    None       Davonna Belling, MD 05/26/19 [phone]

## 2019-05-26 NOTE — ED Notes (Signed)
Pt given [place] sandwich, applesauce, decaf coffee.

## 2019-05-26 NOTE — Consult Note (Signed)
Chief Complaint: Patient was seen in consultation today for anemia  Referring Physician(s): Dr. Florene Glen  Supervising Physician: Sandi Mariscal  Patient Status: [facility] - In-pt  History of Present Illness: [name] is a 78 y.o. female with past medical history of a fib, CAD, DM, HTN, aortic valve replacement, PAD, and chronic symptomatic anemia who presented to [facility] ED today with shortness of breath.  Patient with O2 sats of 78% on RA.  CXR showed worsening pulmonary edema.  Hgb decreased to 6.4.  Patient has been followed by Hematology and had planned for transfusion in 1 week, however now admitted for ongoing work-up and blood transfusion.   Patient assessed at bedside and states "I'm so tired I just want to die."  Patient states that the past several months have been difficult due to her degree of fatigue and weakness. She is interested in moving forward with bone marrow biopsy.  Last dose of coumadin was today.    Past Medical History:  Diagnosis Date  . Aortic atherosclerosis ([place]) 01/23/2018  . Atrial fibrillation ([place])   . Carotid artery disease ([place])   . Cholelithiasis 01/23/2018  . Chronic anticoagulation   . Coronary artery disease    status post coronary artery bypass grafting times 07/10/2004  . Diabetes mellitus   . Hypercholesteremia   . Hypertension   . Mechanical heart valve present    H. aortic valve replacement at the time of bypass surgery October 2005  . Moderate to severe pulmonary hypertension ([place])   . Peripheral arterial disease ([facility])    history of left common iliac artery PTA and stenting for a chronic total occlusion 08/26/01    Past Surgical History:  Procedure Laterality Date  . AORTIC VALVE REPLACEMENT    . CARDIAC CATHETERIZATION  11/10/2004   40% right common illiac, 70% in stent restenosis of distal left common illiac,   . CARDIAC CATHETERIZATION  05/18/2004   LAD 50-70% midstenosis, RCA dominant w/50% stenosis, 50% Right common Illiac artery  ostial stenosis, 90% in stent restenosis within midportion of left common illiac stent  . Carotid Duplex  03/12/2012   RSA-elev. velocities suggestive of a 50-69% diameter reduction, Right&Left Bulb/Prox ICA-mild-mod.fibrous plaqueelevating Velocities abnormal study.  . CHOLECYSTECTOMY N/A 03/01/2018   Procedure: LAPAROSCOPIC CHOLECYSTECTOMY;  Surgeon: Judeth Horn, MD;  Location: [place];  Service: General;  Laterality: N/A;  . COLONOSCOPY WITH PROPOFOL N/A 01/22/2018   Procedure: COLONOSCOPY WITH PROPOFOL;  Surgeon: Wilford Corner, MD;  Location: [place];  Service: Endoscopy;  Laterality: N/A;  . ESOPHAGOGASTRODUODENOSCOPY (EGD) WITH PROPOFOL N/A 01/22/2018   Procedure: ESOPHAGOGASTRODUODENOSCOPY (EGD) WITH PROPOFOL;  Surgeon: Wilford Corner, MD;  Location: [place];  Service: Endoscopy;  Laterality: N/A;  . ESOPHAGOGASTRODUODENOSCOPY (EGD) WITH PROPOFOL N/A 11/21/2018   Procedure: ESOPHAGOGASTRODUODENOSCOPY (EGD) WITH PROPOFOL;  Surgeon: [name], MD;  Location: [place];  Service: Gastroenterology;  Laterality: N/A;  . ESOPHAGOGASTRODUODENOSCOPY (EGD) WITH PROPOFOL Left 02/27/2019   Procedure: ESOPHAGOGASTRODUODENOSCOPY (EGD) WITH PROPOFOL;  Surgeon: Arta Silence, MD;  Location: [facility] ENDOSCOPY;  Service: Endoscopy;  Laterality: Left;  . GIVENS CAPSULE STUDY N/A 11/21/2018   Procedure: GIVENS CAPSULE STUDY;  Surgeon: [name], MD;  Location: [place];  Service: Gastroenterology;  Laterality: N/A;  To be deployed during EGD  . Lower Ext. Duplex  03/12/2012   Right Proximal CIA- vessel narrowing w/elevated velocities 0-49% diameter reduction. Right SFA-mild mixed density plaque throughout vessel.  [name] NM MYOCAR PERF WALL MOTION  05/19/2010   protocol: Persantine, post stress EF 65%,  negative for ischemia, low risk scan  . RIGHT HEART CATH N/A 06/27/2018   Procedure: RIGHT HEART CATH;  Surgeon: [name], MD;  Location: [place] [facility];  Service: Cardiovascular;   Laterality: N/A;  . TRANSTHORACIC ECHOCARDIOGRAM  08/29/2012   Moderately calcified annulus of mitral valve, moderate regurg. of both mitral valve and tricuspid valve.     Allergies: Flagyl [metronidazole], Coreg [carvedilol], Losartan, Verapamil, Zetia [ezetimibe], and Zocor [simvastatin - high dose]  Medications: Prior to Admission medications   Medication Sig Start Date End Date Taking? Authorizing Provider  acetaminophen (TYLENOL) 325 MG tablet Take 325 mg by mouth every 6 (six) hours as needed for mild pain.    Yes [provider]  allopurinol (ZYLOPRIM) 100 MG tablet Take 100 mg by mouth daily.   Yes [provider]  ALPRAZolam Duanne Moron) 0.5 MG tablet Take 0.25 mg by mouth at bedtime.    Yes [provider]  atorvastatin (LIPITOR) 80 MG tablet Take 1 tablet (80 mg total) by mouth daily at 6 PM. 11/22/18  Yes [name], MD  Cholecalciferol (VITAMIN D3) 1000 UNITS CAPS Take 1,000 Units by mouth daily with lunch.    Yes [provider]  Cyanocobalamin (VITAMIN B12) 1000 MCG TBCR Take 1,000 mcg by mouth daily with lunch.    Yes [provider]  diphenhydrAMINE-zinc acetate (BENADRYL) cream Apply topically 3 (three) times daily as needed for itching. 05/09/19  Yes Little Ishikawa, MD  furosemide (LASIX) 40 MG tablet Take 1.5 tablets (60 mg total) by mouth daily. 05/22/19  Yes [name], MD  hydrALAZINE (APRESOLINE) 100 MG tablet TAKE 1 TABLET (100 MG TOTAL) BY MOUTH 3 (THREE) TIMES DAILY. 01/20/19  Yes [name], MD  irbesartan (AVAPRO) 300 MG tablet Take 300 mg by mouth daily.   Yes [provider]  magnesium oxide (MAG-OX) 400 (241.3 Mg) MG tablet Take 1 tablet (400 mg total) by mouth daily. 01/30/18  Yes [name], MD  metFORMIN (GLUCOPHAGE) 500 MG tablet Take 500 mg by mouth 2 (two) times daily with a meal.    Yes [provider]  metoprolol tartrate (LOPRESSOR) 25 MG tablet Take 0.5 tablets (12.5  mg total) by mouth 2 (two) times daily. 03/05/19  Yes [place] Eric J, DO  ondansetron (ZOFRAN ODT) 4 MG disintegrating tablet Take 1 tablet (4 mg total) by mouth every 8 (eight) hours as needed for nausea or vomiting. 03/09/19  Yes [name], Kaitlyn E, PA-C  pantoprazole (PROTONIX) 40 MG tablet Take 40 mg by mouth 2 (two) times daily.  12/05/14  Yes [provider]  predniSONE (DELTASONE) 5 MG tablet Take 1 tablet (5 mg total) by mouth 2 (two) times daily with a meal. 05/22/19  Yes Magrinat, [name], MD  spironolactone (ALDACTONE) 25 MG tablet Take 25 mg by mouth daily.   Yes [provider]  tadalafil, PAH, (ADCIRCA) 20 MG tablet Take 2 tablets (40 mg total) by mouth daily. 05/15/19  Yes [name], MD  vitamin C (ASCORBIC ACID) 500 MG tablet Take 500 mg by mouth daily with lunch.   Yes [provider]  warfarin (COUMADIN) 5 MG tablet Take 5-7.5 mg by mouth daily. Take 1.5 tablets on Tues, Thursday, Saturday and Sunday. Take 1 tablet on Mondays, Wednesdays, and Fridays. 04/26/19  Yes [provider]  feeding supplement, GLUCERNA SHAKE, (GLUCERNA SHAKE) LIQD Take 237 mLs by mouth 2 (two) times daily between meals. Patient not taking: Reported on 05/08/2019  04/07/19   [name], MD  predniSONE (DELTASONE) 5 MG tablet Take 2 tablets (10 mg total) by mouth daily with breakfast. 05/19/19   Magrinat, [name], MD     Family History  Problem Relation Age of Onset  . Breast cancer Neg Hx     Social History   Socioeconomic History  . Marital status: Widowed    Spouse name: Not on file  . Number of children: Not on file  . Years of education: Not on file  . Highest education level: Not on file  Occupational History  . Occupation: Retired  Scientific laboratory technician  . Financial resource strain: Not hard at all  . Food insecurity    Worry: Never true    Inability: Never true  . Transportation needs    Medical: No    Non-medical: No  Tobacco Use  . Smoking status:  Former Smoker    Packs/day: 1.00    Years: 30.00    Pack years: 30.00    Types: Cigarettes  . Smokeless tobacco: Never Used  . Tobacco comment: quit smoking 2005  Substance and Sexual Activity  . Alcohol use: No    Alcohol/week: 0.0 standard drinks  . Drug use: No  . Sexual activity: Not Currently  Lifestyle  . Physical activity    Days per week: 0 days    Minutes per session: 0 min  . Stress: Only a little  Relationships  . Social connections    Talks on phone: More than three times a week    Gets together: More than three times a week    Attends religious service: Never    Active member of club or organization: No    Attends meetings of clubs or organizations: Never    Relationship status: Widowed  Other Topics Concern  . Not on file  Social History Narrative  . Not on file     Review of Systems: A 12 point ROS discussed and pertinent positives are indicated in the HPI above.  All other systems are negative.  Review of Systems  Constitutional: Negative for fatigue and fever.  Respiratory: Negative for cough and shortness of breath.   Cardiovascular: Negative for chest pain.  Gastrointestinal: Negative for abdominal pain.  Genitourinary: Negative for dysuria.  Musculoskeletal: Negative for back pain.  Psychiatric/Behavioral: Negative for behavioral problems and confusion.    Vital Signs: BP (!) 190/52 (BP Location: Right Arm)   Pulse 80   Temp 98.8 F (37.1 C) (Oral)   Resp (!) 22   Ht _0  (1.575 m)   Wt 115 lb 1.3 oz (52.2 kg)   SpO2 94%   BMI 21.05 kg/m   Physical Exam Vitals signs reviewed.  Constitutional:      Appearance: She is well-developed.  Cardiovascular:     Rate and Rhythm: Normal rate and regular rhythm.  Pulmonary:     Effort: Pulmonary effort is normal. Tachypnea present.     Breath sounds: No decreased breath sounds or wheezing.  Skin:    General: Skin is warm and dry.  Neurological:     General: No focal deficit present.      Mental Status: She is alert and oriented to person, place, and time.  Psychiatric:        Mood and Affect: Mood normal. Mood is not anxious.        Behavior: Behavior normal. Behavior is not agitated.      MD Evaluation Airway: WNL Heart: WNL Abdomen: WNL Chest/ Lungs:  WNL ASA  Classification: 3 Mallampati/Airway Score: One   Imaging: Dg Chest Portable 1 View  Result Date: 05/26/2019 CLINICAL DATA:  Weakness and shortness of breath since last night. EXAM: PORTABLE CHEST 1 VIEW COMPARISON:  05/08/2019. FINDINGS: Stable enlarged cardiac silhouette and post CABG changes. Increased diffuse prominence of the interstitial markings, especially on the right, with probable mild interspersed airspace opacities. Kerley lines are noted at the right lung base. No pleural fluid is seen. Bilateral calcified granulomata are again noted. Also noted are coarse left breast calcifications compatible with degenerated fibroadenomata. Diffuse osteopenia. Mild to moderate left and minimal right glenohumeral joint degenerative changes. IMPRESSION: 1. Worsening pulmonary edema or pneumonia/pneumonitis. 2. Stable cardiomegaly. Electronically Signed   By: Claudie Revering M.D.   On: 05/26/2019 09:01   Dg Chest Portable 1 View  Result Date: 05/08/2019 CLINICAL DATA:  Fever EXAM: PORTABLE CHEST 1 VIEW COMPARISON:  04/04/2019, 02/28/2019 FINDINGS: Post sternotomy changes. Cardiomegaly with vascular congestion. Diffuse bilateral interstitial and ground-glass opacity, possible edema. More confluent opacity at the right base may reflect superimposed pneumonia. No pneumothorax. IMPRESSION: 1. Cardiomegaly with vascular congestion and diffuse bilateral interstitial and ground-glass opacity suggesting pulmonary edema. 2. More confluent airspace disease at the right base, could reflect superimposed pneumonia Electronically Signed   By: Donavan Foil M.D.   On: 05/08/2019 02:25    Labs:  CBC: Recent Labs    05/09/19 0624  05/12/19 1215 05/19/19 1152 05/26/19 0844  WBC 3.2* 3.2* 3.5* 4.2  HGB 8.1* 7.2* 7.6* 6.4*  HCT 24.4* 22.8* 24.0* 20.3*  PLT 145* 169 150 155    COAGS: Recent Labs    04/04/19 0821  05/01/19 1127 05/08/19 0218 05/09/19 0624 05/15/19 1142  INR 2.9*   < > 2.6 3.1* 3.1* 2.7  APTT 65*  --   --   --   --   --    < > = values in this interval not displayed.    BMP: Recent Labs    05/09/19 0624 05/12/19 1215 05/21/19 1033 05/26/19 0844  NA 132* 131* 133* 133*  K 3.8 3.9 4.0 4.2  CL 103 104 104 106  CO2 20* 17* 18* 20*  GLUCOSE 98 175* 146* 117*  BUN 51* 69* 56* 53*  CALCIUM 8.6* 8.8* 8.5* 8.3*  CREATININE 1.77* 1.81* 2.25* 1.94*  GFRNONAA 27* 26* 20* 24*  GFRAA 31* 30* 23* 28*    LIVER FUNCTION TESTS: Recent Labs    05/05/19 1109 05/08/19 0218 05/12/19 1215 05/26/19 0844  BILITOT 0.5 1.1 0.7 0.8  AST _0 ALT _1 ALKPHOS 83 73 72 53  PROT 6.8 6.7 7.0 6.0*  ALBUMIN 2.8* 2.8* 2.9* 2.6*    TUMOR MARKERS: No results for input(s): AFPTM, CEA, CA199, CHROMGRNA in the last 8760 hours.  Assessment and Plan: Symptomatic anemia Patient with decreased Hgb of 6.4. admitted for work-up and transfusion.  Bone marrow biopsy requested by Dr. Florene Glen.  Planning for biopsy tomorrow morning as schedule allows.  Patient last dose coumadin was yesterday.  INR ordered. NPO p MN.  Risks and benefits was discussed with the patient and/or patient's family including, but not limited to bleeding, infection, damage to adjacent structures or low yield requiring additional tests.  All of the questions were answered and there is agreement to proceed.  Consent signed and in chart.  Thank you for this interesting consult.  I greatly enjoyed meeting [name] and look forward to participating in their  care.  A copy of this report was sent to the requesting provider on this date.  Electronically Signed: Docia Barrier, PA 05/26/2019, 4:30 PM   I  spent a total of 40 Minutes    in face to face in clinical consultation, greater than 50% of which was counseling/coordinating care for anemia.

## 2019-05-27 ENCOUNTER — Ambulatory Visit: Payer: Medicare HMO | Admitting: Podiatry

## 2019-05-27 ENCOUNTER — Other Ambulatory Visit: Payer: Self-pay | Admitting: *Deleted

## 2019-05-27 ENCOUNTER — Encounter

## 2019-05-27 DIAGNOSIS — D589 Hereditary hemolytic anemia, unspecified: Secondary | ICD-10-CM

## 2019-05-27 DIAGNOSIS — D649 Anemia, unspecified: Secondary | ICD-10-CM | POA: Diagnosis not present

## 2019-05-27 DIAGNOSIS — D509 Iron deficiency anemia, unspecified: Secondary | ICD-10-CM

## 2019-05-27 DIAGNOSIS — J810 Acute pulmonary edema: Secondary | ICD-10-CM | POA: Diagnosis not present

## 2019-05-27 DIAGNOSIS — Z79899 Other long term (current) drug therapy: Secondary | ICD-10-CM | POA: Diagnosis not present

## 2019-05-27 DIAGNOSIS — I5032 Chronic diastolic (congestive) heart failure: Secondary | ICD-10-CM | POA: Diagnosis present

## 2019-05-27 DIAGNOSIS — I2721 Secondary pulmonary arterial hypertension: Secondary | ICD-10-CM

## 2019-05-27 DIAGNOSIS — K219 Gastro-esophageal reflux disease without esophagitis: Secondary | ICD-10-CM | POA: Diagnosis present

## 2019-05-27 DIAGNOSIS — Z952 Presence of prosthetic heart valve: Secondary | ICD-10-CM | POA: Diagnosis not present

## 2019-05-27 DIAGNOSIS — I482 Chronic atrial fibrillation, unspecified: Secondary | ICD-10-CM | POA: Diagnosis present

## 2019-05-27 DIAGNOSIS — D638 Anemia in other chronic diseases classified elsewhere: Secondary | ICD-10-CM | POA: Diagnosis present

## 2019-05-27 DIAGNOSIS — A770 Spotted fever due to Rickettsia rickettsii: Secondary | ICD-10-CM | POA: Diagnosis present

## 2019-05-27 DIAGNOSIS — N184 Chronic kidney disease, stage 4 (severe): Secondary | ICD-10-CM | POA: Diagnosis present

## 2019-05-27 DIAGNOSIS — E119 Type 2 diabetes mellitus without complications: Secondary | ICD-10-CM | POA: Diagnosis not present

## 2019-05-27 DIAGNOSIS — Z7984 Long term (current) use of oral hypoglycemic drugs: Secondary | ICD-10-CM | POA: Diagnosis not present

## 2019-05-27 DIAGNOSIS — J811 Chronic pulmonary edema: Secondary | ICD-10-CM | POA: Diagnosis not present

## 2019-05-27 DIAGNOSIS — K922 Gastrointestinal hemorrhage, unspecified: Secondary | ICD-10-CM | POA: Diagnosis present

## 2019-05-27 DIAGNOSIS — J9601 Acute respiratory failure with hypoxia: Secondary | ICD-10-CM | POA: Diagnosis present

## 2019-05-27 DIAGNOSIS — I130 Hypertensive heart and chronic kidney disease with heart failure and stage 1 through stage 4 chronic kidney disease, or unspecified chronic kidney disease: Secondary | ICD-10-CM | POA: Diagnosis present

## 2019-05-27 DIAGNOSIS — Z951 Presence of aortocoronary bypass graft: Secondary | ICD-10-CM | POA: Diagnosis not present

## 2019-05-27 DIAGNOSIS — D61818 Other pancytopenia: Secondary | ICD-10-CM | POA: Diagnosis present

## 2019-05-27 DIAGNOSIS — M109 Gout, unspecified: Secondary | ICD-10-CM | POA: Diagnosis present

## 2019-05-27 DIAGNOSIS — E1151 Type 2 diabetes mellitus with diabetic peripheral angiopathy without gangrene: Secondary | ICD-10-CM | POA: Diagnosis present

## 2019-05-27 DIAGNOSIS — I700 Atherosclerosis of aorta: Secondary | ICD-10-CM | POA: Diagnosis present

## 2019-05-27 DIAGNOSIS — R233 Spontaneous ecchymoses: Secondary | ICD-10-CM | POA: Diagnosis present

## 2019-05-27 DIAGNOSIS — R0602 Shortness of breath: Secondary | ICD-10-CM | POA: Diagnosis not present

## 2019-05-27 DIAGNOSIS — E7800 Pure hypercholesterolemia, unspecified: Secondary | ICD-10-CM | POA: Diagnosis present

## 2019-05-27 DIAGNOSIS — Z20828 Contact with and (suspected) exposure to other viral communicable diseases: Secondary | ICD-10-CM | POA: Diagnosis present

## 2019-05-27 DIAGNOSIS — I10 Essential (primary) hypertension: Secondary | ICD-10-CM | POA: Diagnosis not present

## 2019-05-27 DIAGNOSIS — I2510 Atherosclerotic heart disease of native coronary artery without angina pectoris: Secondary | ICD-10-CM | POA: Diagnosis present

## 2019-05-27 DIAGNOSIS — F419 Anxiety disorder, unspecified: Secondary | ICD-10-CM | POA: Diagnosis present

## 2019-05-27 DIAGNOSIS — D631 Anemia in chronic kidney disease: Secondary | ICD-10-CM | POA: Diagnosis present

## 2019-05-27 DIAGNOSIS — E1122 Type 2 diabetes mellitus with diabetic chronic kidney disease: Secondary | ICD-10-CM | POA: Diagnosis present

## 2019-05-27 LAB — COMPREHENSIVE METABOLIC PANEL
ALT: 12 U/L (ref 0–44)
AST: 17 U/L (ref 15–41)
Albumin: 2.4 g/dL — ABNORMAL LOW (ref 3.5–5.0)
Alkaline Phosphatase: 49 U/L (ref 38–126)
Anion gap: 9 (ref 5–15)
BUN: 57 mg/dL — ABNORMAL HIGH (ref 8–23)
CO2: 20 mmol/L — ABNORMAL LOW (ref 22–32)
Calcium: 8.3 mg/dL — ABNORMAL LOW (ref 8.9–10.3)
Chloride: 104 mmol/L (ref 98–111)
Creatinine, Ser: 2.05 mg/dL — ABNORMAL HIGH (ref 0.44–1.00)
GFR calc Af Amer: 26 mL/min — ABNORMAL LOW (ref >60)
GFR calc non Af Amer: 22 mL/min — ABNORMAL LOW (ref >60)
Glucose, Bld: 215 mg/dL — ABNORMAL HIGH (ref 70–99)
Potassium: 4.4 mmol/L (ref 3.5–5.1)
Sodium: 133 mmol/L — ABNORMAL LOW (ref 135–145)
Total Bilirubin: 0.7 mg/dL (ref 0.3–1.2)
Total Protein: 5.8 g/dL — ABNORMAL LOW (ref 6.5–8.1)

## 2019-05-27 LAB — GLUCOSE, CAPILLARY
Glucose-Capillary: 169 mg/dL — ABNORMAL HIGH (ref 70–99)
Glucose-Capillary: 194 mg/dL — ABNORMAL HIGH (ref 70–99)
Glucose-Capillary: 194 mg/dL — ABNORMAL HIGH (ref 70–99)
Glucose-Capillary: 174 mg/dL — ABNORMAL HIGH (ref 70–99)

## 2019-05-27 LAB — CBC WITH DIFFERENTIAL/PLATELET
Abs Immature Granulocytes: 0.02 10*3/uL (ref 0.00–0.07)
Basophils Absolute: 0 10*3/uL (ref 0.0–0.1)
Basophils Relative: 0 %
Eosinophils Absolute: 0 10*3/uL (ref 0.0–0.5)
Eosinophils Relative: 0 %
HCT: 22.1 % — ABNORMAL LOW (ref 36.0–46.0)
Hemoglobin: 6.9 g/dL — CL (ref 12.0–15.0)
Immature Granulocytes: 1 %
Lymphocytes Relative: 10 %
Lymphs Abs: 0.3 10*3/uL — ABNORMAL LOW (ref 0.7–4.0)
MCH: 29.5 pg (ref 26.0–34.0)
MCHC: 31.2 g/dL (ref 30.0–36.0)
MCV: 94.4 fL (ref 80.0–100.0)
Monocytes Absolute: 0.1 10*3/uL (ref 0.1–1.0)
Monocytes Relative: 2 %
Neutro Abs: 2.4 10*3/uL (ref 1.7–7.7)
Neutrophils Relative %: 87 %
Platelets: 138 10*3/uL — ABNORMAL LOW (ref 150–400)
RBC: 2.34 MIL/uL — ABNORMAL LOW (ref 3.87–5.11)
RDW: 21.4 % — ABNORMAL HIGH (ref 11.5–15.5)
WBC: 2.8 10*3/uL — ABNORMAL LOW (ref 4.0–10.5)
nRBC: 0 % (ref 0.0–0.2)

## 2019-05-27 LAB — PROCALCITONIN: Procalcitonin: 0.14 ng/mL

## 2019-05-27 LAB — PROTIME-INR
INR: 4.5 (ref 0.8–1.2)
Prothrombin Time: 42 seconds — ABNORMAL HIGH (ref 11.4–15.2)

## 2019-05-27 LAB — PREPARE RBC (CROSSMATCH)

## 2019-05-27 MED ORDER — SODIUM CHLORIDE 0.9% IV SOLUTION: Freq: Once | INTRAVENOUS | Status: DC

## 2019-05-27 MED ORDER — ALUM & MAG HYDROXIDE-SIMETH 200-200-20 MG/5ML PO SUSP
15.0000 mL | Freq: Four times a day (QID) | ORAL | Status: DC | PRN
  Administered 2019-05-27 – 2019-05-29 (×4): 15 mL via ORAL
  Filled 2019-05-27 (×4): qty 30

## 2019-05-27 NOTE — Progress Notes (Signed)
PROGRESS NOTE    [name]  VOJ:[id number]  DOB: 25-Apr-1941  DOA: 05/26/2019 PCP: [name], MD  Brief Narrative:  78 y.o. female with medical history significant of mechanical aortic valve on warfarin, pulmonary hypertension, CAD, diabetes, HTN, HLD and multiple other medical problems presenting after developing SOB overnight.She notes she was in her regular state of health on 8/16, but overnight she woke up short of breath.  She denies CP, fever, chills.  She does not orthopnea and PND.  She notes she had some abdominal discomfort, nausea which has since resolved.Her stool is dark, she suspects due to iron.   She was recently discharged on 7/31 after developing fevers, chills, and a diffuse rash (which she relates to Aranesp use as she apparently had a flare again after her second shot.  She received vanc/cefepime, doxycycline and was discharged with steroid taper.  Work up in the ED revealed acute hypoxic respiratory failure requiring 3 L of nasal cannula, labs showed anemia (hemoglobin 7.6--> 6.4) and  chest x-ray showed pulmonary edema versus pneumonitis.  Patient follows hematology Dr.Magrinat for anemia/thrombocytopenia and receives Aranesp shots.  Subjective:  Patient resting comfortably, still requiring 3 L nasal cannula O2.  Reports rash on her trunk and upper extremities almost resolved but still has some residual rash on lower extremities.  Reports occasional epistaxis-very mild, relates to dry nose  Objective: Vitals:   05/27/19 1459 05/27/19 1607 05/27/19 1622 05/27/19 1637  BP: (!) 148/55 (!) 164/59 (!) 167/60 (!) 167/60  Pulse: 65 67 67 67  Resp: _0 Temp: (!) 97.5 F (36.4 C) 98.1 F (36.7 C) 98.3 F (36.8 C) 98.3 F (36.8 C)  TempSrc: Oral Oral Oral Oral  SpO2: 97% 97% 99% 99%  Weight:      Height:        Intake/Output Summary (Last 24 hours) at 05/27/2019 1857 Last data filed at 05/27/2019 1730 Gross per 24 hour  Intake 290 ml  Output 650  ml  Net -360 ml   Filed Weights   05/26/19 1351 05/27/19 0531  Weight: 52.2 kg 50.6 kg    Physical Examination:  General exam: Appears calm and comfortable  Respiratory system: Clear to auscultation except for decreased breath sounds at bases.  Respiratory effort normal. Cardiovascular system: S1 & S2 heard, RRR.[name] No pedal edema. Gastrointestinal system: Abdomen is nondistended, soft and nontender. Normal bowel sounds heard. Central nervous system: Alert and oriented. No focal neurological deficits. Extremities: Symmetric 5 x 5 power. Skin: Resolving petechial rash noted in bilateral lower extremities. Psychiatry: Judgement and insight appear normal. Mood & affect appropriate.     Data Reviewed: I have personally reviewed following labs and imaging studies  CBC: Recent Labs  Lab 05/26/19 0844 05/26/19 1735 05/27/19 0402  WBC 4.2  --  2.8*  NEUTROABS 3.5  --  2.4  HGB 6.4* 7.9* 6.9*  HCT 20.3* 24.9* 22.1*  MCV 98.1  --  94.4  PLT 155  --  993*   Basic Metabolic Panel: Recent Labs  Lab 05/21/19 1033 05/26/19 0844 05/27/19 0402  NA 133* 133* 133*  K 4.0 4.2 4.4  CL 104 106 104  CO2 18* 20* 20*  GLUCOSE 146* 117* 215*  BUN 56* 53* 57*  CREATININE 2.25* 1.94* 2.05*  CALCIUM 8.5* 8.3* 8.3*   GFR: Estimated Creatinine Clearance: 17.3 mL/min (A) (by C-G formula based on SCr of 2.05 mg/dL (H)). Liver Function Tests: Recent Labs  Lab 05/26/19 0844 05/27/19 0402  AST 19 17  ALT 12 12  ALKPHOS 53 49  BILITOT 0.8 0.7  PROT 6.0* 5.8*  ALBUMIN 2.6* 2.4*   No results for input(s): LIPASE, AMYLASE in the last 168 hours. No results for input(s): AMMONIA in the last 168 hours. Coagulation Profile: Recent Labs  Lab 05/26/19 1735 05/27/19 0402  INR 3.7* 4.5*   Cardiac Enzymes: No results for input(s): CKTOTAL, CKMB, CKMBINDEX, TROPONINI in the last 168 hours. BNP (last 3 results) No results for input(s): PROBNP in the last 8760 hours. HbA1C: No results for  input(s): HGBA1C in the last 72 hours. CBG: Recent Labs  Lab 05/26/19 1824 05/26/19 2102 05/27/19 0716 05/27/19 1133 05/27/19 1600  GLUCAP 171* 124* 169* 194* 194*   Lipid Profile: No results for input(s): CHOL, HDL, LDLCALC, TRIG, CHOLHDL, LDLDIRECT in the last 72 hours. Thyroid Function Tests: No results for input(s): TSH, T4TOTAL, FREET4, T3FREE, THYROIDAB in the last 72 hours. Anemia Panel: Recent Labs    05/26/19 1735  VITAMINB12 1,016*  FOLATE 7.3  FERRITIN 178  TIBC 239*  IRON 51   Sepsis Labs: Recent Labs  Lab 05/26/19 1735 05/27/19 0402  PROCALCITON <0.10 0.14    Recent Results (from the past 240 hour(s))  SARS Coronavirus 2 [facility] order, Performed in [facility] hospital lab) Nasopharyngeal Nasopharyngeal Swab     Status: None   Collection Time: 05/26/19  8:44 AM   Specimen: Nasopharyngeal Swab  Result Value Ref Range Status   SARS Coronavirus 2 NEGATIVE NEGATIVE Final    Comment: (NOTE) If result is NEGATIVE SARS-CoV-2 target nucleic acids are NOT DETECTED. The SARS-CoV-2 RNA is generally detectable in upper and lower  respiratory specimens during the acute phase of infection. The lowest  concentration of SARS-CoV-2 viral copies this assay can detect is 250  copies / mL. A negative result does not preclude SARS-CoV-2 infection  and should not be used as the sole basis for treatment or other  patient management decisions.  A negative result may occur with  improper specimen collection / handling, submission of specimen other  than nasopharyngeal swab, presence of viral mutation(s) within the  areas targeted by this assay, and inadequate number of viral copies  (<250 copies / mL). A negative result must be combined with clinical  observations, patient history, and epidemiological information. If result is POSITIVE SARS-CoV-2 target nucleic acids are DETECTED. The SARS-CoV-2 RNA is generally detectable in upper and lower  respiratory specimens dur  ing the acute phase of infection.  Positive  results are indicative of active infection with SARS-CoV-2.  Clinical  correlation with patient history and other diagnostic information is  necessary to determine patient infection status.  Positive results do  not rule out bacterial infection or co-infection with other viruses. If result is PRESUMPTIVE POSTIVE SARS-CoV-2 nucleic acids MAY BE PRESENT.   A presumptive positive result was obtained on the submitted specimen  and confirmed on repeat testing.  While 2019 novel coronavirus  (SARS-CoV-2) nucleic acids may be present in the submitted sample  additional confirmatory testing may be necessary for epidemiological  and / or clinical management purposes  to differentiate between  SARS-CoV-2 and other Sarbecovirus currently known to infect humans.  If clinically indicated additional testing with an alternate test  methodology [phone]) is advised. The SARS-CoV-2 RNA is generally  detectable in upper and lower respiratory sp ecimens during the acute  phase of infection. The expected result is Negative. Fact Sheet for Patients:  StrictlyIdeas.no Fact Sheet for Healthcare Providers:  BankingDealers.co.za This test is not yet approved or cleared by the [place] and has been authorized for detection and/or diagnosis of SARS-CoV-2 by FDA under an Emergency Use Authorization (EUA).  This EUA will remain in effect (meaning this test can be used) for the duration of the COVID-19 declaration under Section 564(b)(1) of the Act, 21 U.S.C. section 360bbb-3(b)(1), unless the authorization is terminated or revoked sooner. Performed at [facility], [place] [address] [place], [place] [address]       Radiology Studies: Dg Chest Portable 1 View  Result Date: 05/26/2019 CLINICAL DATA:  Weakness and shortness of breath since last night. EXAM: PORTABLE CHEST 1 VIEW COMPARISON:   05/08/2019. FINDINGS: Stable enlarged cardiac silhouette and post CABG changes. Increased diffuse prominence of the interstitial markings, especially on the right, with probable mild interspersed airspace opacities. Kerley lines are noted at the right lung base. No pleural fluid is seen. Bilateral calcified granulomata are again noted. Also noted are coarse left breast calcifications compatible with degenerated fibroadenomata. Diffuse osteopenia. Mild to moderate left and minimal right glenohumeral joint degenerative changes. IMPRESSION: 1. Worsening pulmonary edema or pneumonia/pneumonitis. 2. Stable cardiomegaly. Electronically Signed   By: Claudie Revering M.D.   On: 05/26/2019 09:01        Scheduled Meds: . sodium chloride   Intravenous Once  . allopurinol  100 mg Oral Daily  . ALPRAZolam  0.25 mg Oral QHS  . atorvastatin  80 mg Oral q1800  . cholecalciferol  1,000 Units Oral Q lunch  . doxycycline  100 mg Oral Q12H  . furosemide  60 mg Intravenous BID  . hydrALAZINE  100 mg Oral TID  . insulin aspart  0-5 Units Subcutaneous QHS  . insulin aspart  0-9 Units Subcutaneous TID WC  . magnesium oxide  400 mg Oral Daily  . mouth rinse  15 mL Mouth Rinse BID  . metoprolol tartrate  12.5 mg Oral BID  . pantoprazole  40 mg Oral BID  . polyethylene glycol  17 g Oral Daily  . predniSONE  40 mg Oral Q breakfast  . spironolactone  25 mg Oral Daily  . tadalafil  40 mg Oral Daily  . vitamin B-12  1,000 mcg Oral Q lunch  . vitamin C  500 mg Oral Q lunch  . Warfarin - Pharmacist Dosing Inpatient   Does not apply q1800   Continuous Infusions:  Assessment & Plan:    1.  Dyspnea/acute hypoxic respiratory failure: Secondary to pulmonary edema versus anemia versus pneumonitis as seen on chest x-ray.  Mildly elevated BNP (395).  Recent echo from July showed preserved EF at 60 to 65%, pulmonary artery hypertension. Seen by PCCM who agreed with IV diuresis for now.  Patient on tadalafil which has been  resumed.  Plan for repeat chest x-ray in a.m.  Monitor I's and O's, daily weights and follow renal function closely.  Monitor for fluid overload signs with blood transfusion  2.  Pancytopenia: Patient followed by hematology closely.  Patient receiving 2 units of PRBC.She had lab work drawn during her prior hospitalization which showed a normal C3 and C4 complement, normal double-stranded DNA antibody, normal cryoglobulin, elevated P-ANCA titer of 1:640, elevated myeloperoxidase antibody 20.6, and elevated ANCA proteinase 3 at 40.3.  Recommendations per hematology were to proceed with a bone marrow biopsy.However, there is concern for possible vasculitis given her positive P-ANCA and positive MPO and PR-3.  The bone marrow biopsy was placed on hold by hematology who  would like her to follow-up with rheumatology for further evaluation of this.  If no improvement, bone marrow biopsy would be pursued.  3. Petechial rash  RMSF: pt presented late July with fever and petechial rash.  She was treated initially with doxycycline, but it does not sound like this course was completed.  She attributes the rash to the injections she's been getting for her Hb (sounds like aranesp).  Per Dr. [name] note, this was thought to be less likely.  Pt also noted to have positive IgM for RMSF.  Resumed on doxycycline and steroids on admission.  4.  Positive p-ANCA: Given respiratory symptoms, possibility of eosinophilic granulomatosis with polyangiitis was suspected on admission.  Pulmonary recommends deferring invasive work-up like biopsies of nasal mucosa/skin biopsy/renal biopsy as patient on anticoagulation for mechanical heart valve and clinical history equivocal/renal function relatively stable.  5. Mechanical Aortic Valve  Chronic Atrial Fibrillation: INR goal 2.5-3.5.    Supratherapeutic today with INR at 4.5.  Hold Warfarin and further adjustments per pharmacy  6. CKD IV: creatinine appears to be close to baseline  around 1.7-1.8.  1.94 today.  Follow with diuresis.  7. Hypertension: continue metoprolol, spironolactone, lasix IV.  Hold irbesartan.  8. CAD: continue lipitor, metoprolol.  Not on aspirin  9.Anxiety: continue xanax  10. GERD , Hx GI Bleed: continue PPI BID  11. Gout: continue allopurinol  12. T2DM: hold metformin, start SSI  13.  Peripheral vascular disease: Patient states she underwent work-up for peripheral circulation/PTCA by Dr. [name] and was scheduled to undergo arterial studies including Dopplers of lower extremities/carotid on Thursday.  She is asking if she could undergo these studies while here. Will consider obtaining if she is still here on Thursday   DVT prophylaxis: On anticoagulation Code Status: Full code Family / Patient Communication: Discussed with patient and all questions answered to satisfaction Disposition Plan: Home when medically cleared     LOS: 0 days    Time spent: 24 minutes    Guilford Shi, MD [facility] Pager [phone]  If 7PM-7AM, please contact night-coverage [email or web address] Password [facility] 05/27/2019, 6:57 PM

## 2019-05-27 NOTE — Progress Notes (Signed)
ANTICOAGULATION CONSULT NOTE -   Pharmacy Consult for warfarin Indication: atrial fibrillation and mechanical heart valve  Allergies  Allergen Reactions  . Flagyl [Metronidazole] Rash    ALL-OVER BODY RASH  . Coreg [Carvedilol] Other (See Comments)    Terrible cramping in the feet and had a lot of bowel movements, but not diarrhea  . Losartan Swelling    Patient doesn't recall site of swelling  . Verapamil Hives  . Zetia [Ezetimibe] Other (See Comments)    Reaction not recalled  . Zocor [Simvastatin - High Dose] Other (See Comments)    Reaction not recalled    Patient Measurements: Height: 5\' 2"  (157.5 cm) Weight: 111 lb 8.8 oz (50.6 kg) IBW/kg (Calculated) : 50.1   Vital Signs: Temp: 97.6 F (36.4 C) (08/18 0434) Temp Source: Oral (08/18 0434) BP: 132/72 (08/18 0434) Pulse Rate: 71 (08/18 0434)  Labs: Recent Labs    05/26/19 0844 05/26/19 1106 05/26/19 1735 05/27/19 0402  HGB 6.4*  --  7.9* 6.9*  HCT 20.3*  --  24.9* 22.1*  PLT 155  --   --  138*  LABPROT  --   --  35.8* 42.0*  INR  --   --  3.7* 4.5*  CREATININE 1.94*  --   --  2.05*  TROPONINIHS 14 14  --   --     Estimated Creatinine Clearance: 17.3 mL/min (A) (by C-G formula based on SCr of 2.05 mg/dL (H)).   Medical History: Past Medical History:  Diagnosis Date  . Aortic atherosclerosis ([place]) 01/23/2018  . Atrial fibrillation ([place])   . Carotid artery disease ([place])   . Cholelithiasis 01/23/2018  . Chronic anticoagulation   . Coronary artery disease    status post coronary artery bypass grafting times 07/10/2004  . Diabetes mellitus   . Hypercholesteremia   . Hypertension   . Mechanical heart valve present    H. aortic valve replacement at the time of bypass surgery October 2005  . Moderate to severe pulmonary hypertension ([place])   . Peripheral arterial disease ([facility])    history of left common iliac artery PTA and stenting for a chronic total occlusion 08/26/01     Assessment: Pt presents  with SOB, not on O2 at baseline.  On warfarin for Hx of AFib and mechanical heart valve. Home warfarin dose is 5mg  on M,W,F and 7.5mg  all other days.  Last dose 8/16  05/27/2019 INR 4.5, supratherapeutic Hgb 6.9 Plts 138 DI: doxycycline: may increase warfarin effects  Goal of Therapy:  INR 2-3 (per anti-coag notes)   Plan:  Hold warfarin tonight Daily INR  Dolly Rias RPh 05/27/2019, 7:06 AM

## 2019-05-27 NOTE — Progress Notes (Addendum)
HEMATOLOGY-ONCOLOGY PROGRESS NOTE  SUBJECTIVE: [name] has been readmitted to the hospital due to worsening shortness of breath.  Shortness of breath was thought to be related to pulmonary edema as well as anemia.  On admission, her hemoglobin was noted to be 6.4.  She initially received 1 unit of packed red blood cells with improvement of her hemoglobin 7.9.  Hemoglobin again this morning was down to 6.9 and she is due to receive 1 more unit of packed red blood cells today.  Additional labs of been reviewed and ferritin was noted to be 178, vitamin B12 is elevated, folate 7.3, CRP elevated at 3.3, and sed rate elevated at 94.  She had lab work drawn during her prior hospitalization which showed a normal C3 and C4 complement, normal double-stranded DNA antibody, normal cryoglobulin, elevated P-ANCA titer of 1:640, elevated myeloperoxidase antibody 20.6, and elevated ANCA proteinase 3 at 40.3.  Recommendations per hematology were to proceed with a bone marrow biopsy.  However, this has been placed on hold due to her elevated ANCA antibody.  The patient reports that her breathing is better.  She denies bleeding.  She states her stools are dark but thinks is related to her iron intake.  Stool for occult blood has been ordered but not yet collected.  She has no other complaints this morning.  REVIEW OF SYSTEMS:   A comprehensive 14 point review of systems was negative except as noted in the HPI.  I have reviewed the past medical history, past surgical history, social history and family history with the patient and they are unchanged from previous note.   PHYSICAL EXAMINATION: ECOG PERFORMANCE STATUS: 1 - Symptomatic but completely ambulatory  Vitals:   05/26/19 2106 05/27/19 0434  BP: (!) 157/50 132/72  Pulse: (!) 54 71  Resp: 18 16  Temp: 98.6 F (37 C) 97.6 F (36.4 C)  SpO2: 93% 94%   Filed Weights   05/26/19 1351 05/27/19 0531  Weight: 115 lb 1.3 oz (52.2 kg) 111 lb 8.8 oz (50.6 kg)     Intake/Output from previous day: 08/17 0701 - 08/18 0700 In: 352 [Blood:352] Out: 1200 [Urine:1200]  GENERAL:alert, no distress and comfortable LUNGS: Decreased in the bases with normal breathing effort HEART: regular rate & rhythm with audible click, trace bilateral lower extremity edema ABDOMEN:abdomen soft, non-tender and normal bowel sounds Musculoskeletal:no cyanosis of digits and no clubbing  NEURO: alert & oriented x 3 with fluent speech, no focal motor/sensory deficits  LABORATORY DATA:  I have reviewed the data as listed CMP Latest Ref Rng & Units 05/27/2019 05/26/2019 05/21/2019  Glucose 70 - 99 mg/dL 215(H) 117(H) 146(H)  BUN 8 - 23 mg/dL 57(H) 53(H) 56(H)  Creatinine 0.44 - 1.00 mg/dL 2.05(H) 1.94(H) 2.25(H)  Sodium 135 - 145 mmol/L 133(L) 133(L) 133(L)  Potassium 3.5 - 5.1 mmol/L 4.4 4.2 4.0  Chloride 98 - 111 mmol/L 104 106 104  CO2 22 - 32 mmol/L 20(L) 20(L) 18(L)  Calcium 8.9 - 10.3 mg/dL 8.3(L) 8.3(L) 8.5(L)  Total Protein 6.5 - 8.1 g/dL 5.8(L) 6.0(L) -  Total Bilirubin 0.3 - 1.2 mg/dL 0.7 0.8 -  Alkaline Phos 38 - 126 U/L 49 53 -  AST 15 - 41 U/L 17 19 -  ALT 0 - 44 U/L 12 12 -    Lab Results  Component Value Date   WBC 2.8 (L) 05/27/2019   HGB 6.9 (LL) 05/27/2019   HCT 22.1 (L) 05/27/2019   MCV 94.4 05/27/2019   PLT 138 (L)  05/27/2019   NEUTROABS 2.4 05/27/2019    Dg Chest Portable 1 View  Result Date: 05/26/2019 CLINICAL DATA:  Weakness and shortness of breath since last night. EXAM: PORTABLE CHEST 1 VIEW COMPARISON:  05/08/2019. FINDINGS: Stable enlarged cardiac silhouette and post CABG changes. Increased diffuse prominence of the interstitial markings, especially on the right, with probable mild interspersed airspace opacities. Kerley lines are noted at the right lung base. No pleural fluid is seen. Bilateral calcified granulomata are again noted. Also noted are coarse left breast calcifications compatible with degenerated fibroadenomata. Diffuse  osteopenia. Mild to moderate left and minimal right glenohumeral joint degenerative changes. IMPRESSION: 1. Worsening pulmonary edema or pneumonia/pneumonitis. 2. Stable cardiomegaly. Electronically Signed   By: Claudie Revering M.D.   On: 05/26/2019 09:01   Dg Chest Portable 1 View  Result Date: 05/08/2019 CLINICAL DATA:  Fever EXAM: PORTABLE CHEST 1 VIEW COMPARISON:  04/04/2019, 02/28/2019 FINDINGS: Post sternotomy changes. Cardiomegaly with vascular congestion. Diffuse bilateral interstitial and ground-glass opacity, possible edema. More confluent opacity at the right base may reflect superimposed pneumonia. No pneumothorax. IMPRESSION: 1. Cardiomegaly with vascular congestion and diffuse bilateral interstitial and ground-glass opacity suggesting pulmonary edema. 2. More confluent airspace disease at the right base, could reflect superimposed pneumonia Electronically Signed   By: Donavan Foil M.D.   On: 05/08/2019 02:25    ASSESSMENT: 78 y.o. [place] woman with multifactorial anemia, as follows: (a) anemia of chronic inflammation: As of May 2020 she has a SED rate of 95, CRP 1.1, positive ANA and RF; she has been referred to rheumatology. Inflammation makes iron stores relatively inaccessible for hematopoiesis and blunts the effect of native EPO (b) hemolysis (mild): she has a mildly elevated LDH and a DAT positive for complement, not IgG; this is c/w (a) above (c) anemia of renal insufficiency: inadequate EPO resonse to anemia                     (d) GIB/ iron deficiency in patient on lifelong anticoagulaton  (1) Feraheme received 03/01/2019, 03/20/2019, and 05/12/2019             (a) reticulocyte 03/07/2019 up from 43.4 a year ago to 191.8 after iron infusion  (b) reticulocyte 05/19/2019 208.5  (2) darbepoetin: starting 05/05/2019 at 19mg given every 2 weeks.    (3) prednisone 10 mg daily started on 05/19/2019               PLAN: [name] is  admitted for shortness of breath.  Shortness of breath thought to be multifactorial related to her pulmonary edema and anemia.  Her breathing has improved after receiving packed red blood cells.  She continues to have persistent anemia despite IV iron, Aranesp, and prednisone.  She also has leukopenia and mild thrombocytopenia on her lab work from admission.  We have recommended proceeding with a bone marrow biopsy for further evaluation.  However, there is concern for possible vasculitis given her positive P-ANCA and positive MPO and PR-3.  The bone marrow biopsy was placed on hold.  Will need to follow-up with rheumatology for further evaluation of this.  If her counts do not improve, we would again recommend proceeding with a bone marrow biopsy for further evaluation.   LOS: 0 days   KMikey Bussing DNP, AGPCNP-BC, AOCNP 05/27/19    ADDENDUM: We have been trying to resolve [name]'s multifactorial anemia--she has received multiple transfusions, iron replacement, epo, and some steroids (likely insufficient) given her high ESR and positive C'. She  has had a good reticulocyte response but remains anemic. It may be that she needs more aggressive rheumatologic intervention. From a heme point of view I have no other suggestions beyond continuing what we were ding before other than considering a BMBX to make sure we are not missing something.  Appreciate your care of this elderly and complex patient  Chauncey Cruel, MD Medical Oncology and Hematology [facility] [address] [place], [place] [address] Tel. [phone]    Fax. [phone]

## 2019-05-27 NOTE — Patient Outreach (Signed)
[place] [facility]) Care Management  05/27/2019  [name] 1941/04/02 [id number]   Noted that member has been readmitted to hospital for symptomatic anemia.  Hospital liaisons notified, will follow up pending discharge.  [name], [place], MSN [place] [phone]

## 2019-05-27 NOTE — Progress Notes (Signed)
CRITICAL VALUE ALERT  Critical Value:  Hgb 6.9  Date & Time Notied:  05/27/19  0500  Provider Notified: [place]  Orders Received/Actions taken: Orders received to transfuse 1 unit PRBC

## 2019-05-27 NOTE — Progress Notes (Addendum)
   NAME:  [name], MRN:  [id number], DOB:  [birth year], LOS: 0 ADMISSION DATE:  05/26/2019, CONSULTATION DATE:  05/27/19 REFERRING MD:  Florene Glen, CHIEF COMPLAINT:  ANCA+   Brief History   History of chronic hemolytic anemia, chemical aortic valve replacement, HFpEF   -admitted for symptomatic anemia.    She is not noticing increased swelling or shortness of breath compared to her baseline.  Pulmonology has been consulted due to concern for pneumonitis pattern on chest xray   Past Medical History  Mechanical aortic valve replacement Atrial fibrillation Chronic Coumadin use Heart failure with preserved ejection fraction Pulmonary hypertension GI bleed Hemolytic anemia and thrombocytopenia Diabetes  Significant Hospital Events   8/17>> transfused 1 unit of packed red blood cells for hemoglobin 6.4, down from 7.6 on 05/19/2019.  Consults:  Pulmonary  Procedures:    Significant Diagnostic Tests:  CXR 8/17- suggestive of pulmonary edema p-ANCA +1:640, elevated MPO & PR3 on 05/08/2019 ESR 94<--114 on 730/20 CRP 3.3<-2.5 on 05/08/2019   Micro Data:  COVID negative  Antimicrobials:  Doxycycline  Interim history/subjective:   Feels better Objective   Blood pressure 132/72, pulse 71, temperature 97.6 F (36.4 C), temperature source Oral, resp. rate 16, height _0  (1.575 m), weight 50.6 kg, SpO2 94 %.        Intake/Output Summary (Last 24 hours) at 05/27/2019 1041 Last data filed at 05/27/2019 0932 Gross per 24 hour  Intake 592 ml  Output 1200 ml  Net -608 ml   Filed Weights   05/26/19 1351 05/27/19 0531  Weight: 52.2 kg 50.6 kg    Examination:  General this is a pleasant 78 year old white female. Resting in bed. No acute distress  HENT NCAT no JVD MMM Card RRR w/ valve click  pulm decreased bases no accessory use no wheeze abd not tender  Ext decreased LE edema brisk CR strong pulses Neuro intact   Resolved Hospital Problem list     Assessment & Plan:   Acute hypoxic respiratory failure likely due to pulmonary edema.   Mod to severe pulmonary HTN Chronic diastolic HF Possible vasculitis: Positive p-ANCA with positive MPO and PR-3-this constellation is less specific, but still raises concern for vasculitis, especially with chronically elevated ESR and CRP.  H/o epistaxis  Symptomatic multi-factorial anemia (chronic inflammation, hemolysis CRI  Gout  Discussion Feels better after transfusion and lasix. She has known h/o mod->svr PH and diastolic HF. Her autoimmune studies are elevated but non-specific. Chart indicates h/o gout and has been seen by rheumatology as out-pt.  Would be more inclined to think her CXR findings are c/w edema and not due to pneumonitis. I wonder if she is more hypoxic at home than we have thought in the past. If so, adding that to underlying diastolic HF and PH would be a good explanation for why she would be in pulmonary edema.   Plan/rec Cont diuresis as BP/BUN and cr allow  F/u cxr in am  Consider nephrology consult for renal vasculitis  Walking oximetry prior to dc (does she need oxygen at home)  have her f/u with her rheumatologist as out-pt  Can f/u with Dr Valeta Harms as out-pt. Will place appointment in chart.  No change in home pred   Will see again in am and f/u CXR  Erick Colace ACNP-BC [place] Pager # [phone] OR # [phone] if no answer

## 2019-05-27 NOTE — Progress Notes (Signed)
CRITICAL VALUE ALERT  Critical Value:  INR 4.5  Date & Time Notied:  05/27/19  06:30  Provider Notified: Kamineni at 07:00  Orders Received/Actions taken:

## 2019-05-27 NOTE — Progress Notes (Signed)
See separate note.

## 2019-05-27 NOTE — Progress Notes (Signed)
Patient voided 75 ml.  Bladder scanner showed over 563ml in bladder. Foley to be reinserted per verbal order from Dr. Karleen Hampshire.

## 2019-05-28 ENCOUNTER — Inpatient Hospital Stay (HOSPITAL_COMMUNITY): Payer: Medicare HMO

## 2019-05-28 DIAGNOSIS — I7782 Antineutrophilic cytoplasmic antibody (ANCA) vasculitis: Secondary | ICD-10-CM

## 2019-05-28 DIAGNOSIS — I482 Chronic atrial fibrillation, unspecified: Secondary | ICD-10-CM | POA: Diagnosis not present

## 2019-05-28 DIAGNOSIS — E1151 Type 2 diabetes mellitus with diabetic peripheral angiopathy without gangrene: Secondary | ICD-10-CM | POA: Diagnosis not present

## 2019-05-28 DIAGNOSIS — I776 Arteritis, unspecified: Secondary | ICD-10-CM

## 2019-05-28 DIAGNOSIS — E119 Type 2 diabetes mellitus without complications: Secondary | ICD-10-CM

## 2019-05-28 DIAGNOSIS — D61818 Other pancytopenia: Secondary | ICD-10-CM

## 2019-05-28 DIAGNOSIS — N184 Chronic kidney disease, stage 4 (severe): Secondary | ICD-10-CM | POA: Diagnosis not present

## 2019-05-28 DIAGNOSIS — I739 Peripheral vascular disease, unspecified: Secondary | ICD-10-CM | POA: Diagnosis not present

## 2019-05-28 DIAGNOSIS — E1122 Type 2 diabetes mellitus with diabetic chronic kidney disease: Secondary | ICD-10-CM | POA: Diagnosis not present

## 2019-05-28 DIAGNOSIS — I10 Essential (primary) hypertension: Secondary | ICD-10-CM

## 2019-05-28 DIAGNOSIS — J9601 Acute respiratory failure with hypoxia: Secondary | ICD-10-CM | POA: Diagnosis not present

## 2019-05-28 DIAGNOSIS — R0600 Dyspnea, unspecified: Secondary | ICD-10-CM

## 2019-05-28 DIAGNOSIS — I2721 Secondary pulmonary arterial hypertension: Secondary | ICD-10-CM | POA: Diagnosis not present

## 2019-05-28 LAB — CBC WITH DIFFERENTIAL/PLATELET
Abs Immature Granulocytes: 0.02 10*3/uL (ref 0.00–0.07)
Basophils Absolute: 0 10*3/uL (ref 0.0–0.1)
Basophils Relative: 0 %
Eosinophils Absolute: 0.1 10*3/uL (ref 0.0–0.5)
Eosinophils Relative: 1 %
HCT: 25.6 % — ABNORMAL LOW (ref 36.0–46.0)
Hemoglobin: 8.3 g/dL — ABNORMAL LOW (ref 12.0–15.0)
Immature Granulocytes: 0 %
Lymphocytes Relative: 4 %
Lymphs Abs: 0.2 10*3/uL — ABNORMAL LOW (ref 0.7–4.0)
MCH: 29.6 pg (ref 26.0–34.0)
MCHC: 32.4 g/dL (ref 30.0–36.0)
MCV: 91.4 fL (ref 80.0–100.0)
Monocytes Absolute: 0.1 10*3/uL (ref 0.1–1.0)
Monocytes Relative: 2 %
Neutro Abs: 5.3 10*3/uL (ref 1.7–7.7)
Neutrophils Relative %: 93 %
Platelets: 181 10*3/uL (ref 150–400)
RBC: 2.8 MIL/uL — ABNORMAL LOW (ref 3.87–5.11)
RDW: 21.3 % — ABNORMAL HIGH (ref 11.5–15.5)
WBC: 5.7 10*3/uL (ref 4.0–10.5)
nRBC: 0 % (ref 0.0–0.2)

## 2019-05-28 LAB — GLUCOSE, CAPILLARY
Glucose-Capillary: 118 mg/dL — ABNORMAL HIGH (ref 70–99)
Glucose-Capillary: 259 mg/dL — ABNORMAL HIGH (ref 70–99)
Glucose-Capillary: 209 mg/dL — ABNORMAL HIGH (ref 70–99)
Glucose-Capillary: 250 mg/dL — ABNORMAL HIGH (ref 70–99)

## 2019-05-28 LAB — PROCALCITONIN: Procalcitonin: 0.12 ng/mL

## 2019-05-28 LAB — PROTIME-INR
INR: 3.9 — ABNORMAL HIGH (ref 0.8–1.2)
Prothrombin Time: 37.5 seconds — ABNORMAL HIGH (ref 11.4–15.2)

## 2019-05-28 NOTE — Progress Notes (Signed)
[name] [facility]  PROGRESS NOTE    [name]  XBJ:[id number] DOB: 09/09/1941 DOA: 05/26/2019 PCP: [name], MD   Brief Narrative:   78 y.o.femalewith medical history significant ofmechanical aortic valve on warfarin, pulmonary hypertension, CAD, diabetes, HTN, HLD and multiple other medical problems presenting after developing SOB overnight.She notes she was in her regular state of health on 8/16, but overnight she woke up short of breath. She denies CP, fever, chills. She does not orthopnea and PND. She notes she had some abdominal discomfort, nausea which has since resolved.Her stool is dark, she suspects due to iron.  She was recently discharged on 7/31 after developing fevers, chills, and a diffuse rash (which she relates to Aranesp use as she apparently had a flare again after her second shot. She received vanc/cefepime, doxycycline and was discharged with steroid taper. Work up in the ED revealed acute hypoxic respiratory failure requiring 3 L of nasal cannula, labs showed anemia (hemoglobin 7.6--> 6.4) and chest x-ray showed pulmonary edema versus pneumonitis.  Patient follows hematology Dr.Magrinat for anemia/thrombocytopenia and receives Aranesp shots.   Assessment & Plan:   Active Problems:   Hypertension   Diabetes mellitus without complication (HCC)   Anemia   Pancytopenia (HCC)   Dyspnea   Positive P-ANCA titer   Dyspnea/acute hypoxic respiratory failure:      - Secondary to pulmonary edema versus anemia versus pneumonitis as seen on chest x-ray.       - Mildly elevated BNP (395).       - Recent echo from July showed preserved EF at 60 to 65%, pulmonary artery hypertension.      - Seen by PCCM who agreed with IV diuresis for now.       - Patient on tadalafil which has been resumed.      - Monitor I's and O's, daily weights and follow renal function closely.      - Monitor for fluid overload signs with blood transfusion     - continue diuresis as renal function  allows     - PCCM has s/o'd.  Pancytopenia:      - Patient followed by hematology closely.       - Patient receiving 2 units of PRBC.     - She had lab work drawn during her prior hospitalization which showed a normal C3 and C4 complement,normal double-stranded DNA antibody, normal cryoglobulin,elevated P-ANCA titer of 1:640, elevated myeloperoxidase antibody 20.6, and elevated ANCA proteinase 3 at 40.3.     - Recommendations per hematology were to proceed with a bone marrow biopsy.     - However, there is concern for possible vasculitis given her positive P-ANCA and positive MPO and PR-3.     -The bone marrow biopsy was placed on hold by hematology who would like her to follow-up with rheumatology for further evaluation of this.  If no improvement, bone marrow biopsy would be pursued.  Petechial rash/RMSF:     - pt presented late July with fever and petechial rash.      - She was treated initially with doxycycline, but it does not sound like this course was completed.      - She attributes the rash to the injections she's been getting for her Hb (sounds like aranesp).      - Per Dr. [name] note, this was thought to be less likely; Pt also noted to have positive IgM for RMSF; Resumed on doxycycline and steroids on admission.  Positive p-ANCA:      -  Given respiratory symptoms, possibility of eosinophilic granulomatosis with polyangiitis was suspected on admission.      - Pulmonary recommends deferring invasive work-up like biopsies of nasal mucosa/skin biopsy/renal biopsy as patient on anticoagulation for mechanical heart valve and clinical history equivocal/renal function relatively stable.  Mechanical Aortic Valve/Chronic Atrial Fibrillation:     - INR goal 2.5-3.5.        - Supratherapeutic today with INR at 3.9.       - Hold Warfarin and further adjustments per pharmacy  CKD IV:     - creatinine appears to be close to baseline around 1.7-1.8.     - Follow with  diuresis.  Hypertension:      - continue metoprolol, spironolactone, lasix IV.      - Hold irbesartan.  CAD:      - continue lipitor, metoprolol.       - Not on aspirin  Anxiety:      - continue xanax  GERD, Hx GI Bleed:      - continue PPI BID  Gout:      - continue allopurinol  T2DM:      - hold metformin, start SSI  Peripheral vascular disease:      - Patient states she underwent work-up for peripheral circulation/PTCA by Dr. [name] and was scheduled to undergo arterial studies including Dopplers of lower extremities/carotid on Thursday; follow up outpt  Continue diuresis. Follow renal function. Walk w/ O2 measurement. Hopeful for d/c soon.  DVT prophylaxis: Warfarin Code Status: FULL Family Communication: None at bedside   Disposition Plan: TBD  Consultants:   PCCM  Heme/Onc  ROS:  Denies CP, dyspnea, N/V . Remainder 10-pt ROS is negative for all not previously mentioned.  Subjective: "How long is this going to go on?"  Objective: Vitals:   05/27/19 1945 05/28/19 0417 05/28/19 1033 05/28/19 1325  BP: (!) 172/60 (!) 154/59  (!) 177/69  Pulse: 65 (!) 59 65 (!) 57  Resp: '18 20  19  ' Temp: 97.8 F (36.6 C) 97.7 F (36.5 C)  98.2 F (36.8 C)  TempSrc: Oral Oral  Oral  SpO2: 98% 97%  98%  Weight:  50.3 kg    Height:        Intake/Output Summary (Last 24 hours) at 05/28/2019 1655 Last data filed at 05/28/2019 1600 Gross per 24 hour  Intake 886 ml  Output 750 ml  Net 136 ml   Filed Weights   05/26/19 1351 05/27/19 0531 05/28/19 0417  Weight: 52.2 kg 50.6 kg 50.3 kg    Examination:  General: 78 y.o. female resting in bed in NAD Eyes: PERRL, normal sclera ENMT: Nares patent w/o discharge, orophaynx clear, dentition normal, ears w/o discharge/lesions/ulcers Cardiovascular: RRR, +S1, S2, no m/g/r, equal pulses throughout Respiratory: CTABL, no w/r/r, normal WOB GI: BS+, NDNT, no masses noted, no organomegaly noted MSK: No e/c/c Skin: No  rashes, bruises, ulcerations noted Neuro: A&O x 3, no focal deficits   Data Reviewed: I have personally reviewed following labs and imaging studies.  CBC: Recent Labs  Lab 05/26/19 0844 05/26/19 1735 05/27/19 0402 05/28/19 1207  WBC 4.2  --  2.8* 5.7  NEUTROABS 3.5  --  2.4 5.3  HGB 6.4* 7.9* 6.9* 8.3*  HCT 20.3* 24.9* 22.1* 25.6*  MCV 98.1  --  94.4 91.4  PLT 155  --  138* 540   Basic Metabolic Panel: Recent Labs  Lab 05/26/19 0844 05/27/19 0402  NA 133* 133*  K 4.2 4.4  CL  106 104  CO2 20* 20*  GLUCOSE 117* 215*  BUN 53* 57*  CREATININE 1.94* 2.05*  CALCIUM 8.3* 8.3*   GFR: Estimated Creatinine Clearance: 17.3 mL/min (A) (by C-G formula based on SCr of 2.05 mg/dL (H)). Liver Function Tests: Recent Labs  Lab 05/26/19 0844 05/27/19 0402  AST 19 17  ALT 12 12  ALKPHOS 53 49  BILITOT 0.8 0.7  PROT 6.0* 5.8*  ALBUMIN 2.6* 2.4*   No results for input(s): LIPASE, AMYLASE in the last 168 hours. No results for input(s): AMMONIA in the last 168 hours. Coagulation Profile: Recent Labs  Lab 05/26/19 1735 05/27/19 0402 05/28/19 0404  INR 3.7* 4.5* 3.9*   Cardiac Enzymes: No results for input(s): CKTOTAL, CKMB, CKMBINDEX, TROPONINI in the last 168 hours. BNP (last 3 results) No results for input(s): PROBNP in the last 8760 hours. HbA1C: No results for input(s): HGBA1C in the last 72 hours. CBG: Recent Labs  Lab 05/27/19 1133 05/27/19 1600 05/27/19 2120 05/28/19 0740 05/28/19 1210  GLUCAP 194* 194* 174* 118* 259*   Lipid Profile: No results for input(s): CHOL, HDL, LDLCALC, TRIG, CHOLHDL, LDLDIRECT in the last 72 hours. Thyroid Function Tests: No results for input(s): TSH, T4TOTAL, FREET4, T3FREE, THYROIDAB in the last 72 hours. Anemia Panel: Recent Labs    05/26/19 1735  VITAMINB12 1,016*  FOLATE 7.3  FERRITIN 178  TIBC 239*  IRON 51   Sepsis Labs: Recent Labs  Lab 05/26/19 1735 05/27/19 0402 05/28/19 0404  PROCALCITON <0.10 0.14 0.12     Recent Results (from the past 240 hour(s))  SARS Coronavirus 2 [facility] order, Performed in [facility] hospital lab) Nasopharyngeal Nasopharyngeal Swab     Status: None   Collection Time: 05/26/19  8:44 AM   Specimen: Nasopharyngeal Swab  Result Value Ref Range Status   SARS Coronavirus 2 NEGATIVE NEGATIVE Final    Comment: (NOTE) If result is NEGATIVE SARS-CoV-2 target nucleic acids are NOT DETECTED. The SARS-CoV-2 RNA is generally detectable in upper and lower  respiratory specimens during the acute phase of infection. The lowest  concentration of SARS-CoV-2 viral copies this assay can detect is 250  copies / mL. A negative result does not preclude SARS-CoV-2 infection  and should not be used as the sole basis for treatment or other  patient management decisions.  A negative result may occur with  improper specimen collection / handling, submission of specimen other  than nasopharyngeal swab, presence of viral mutation(s) within the  areas targeted by this assay, and inadequate number of viral copies  (<250 copies / mL). A negative result must be combined with clinical  observations, patient history, and epidemiological information. If result is POSITIVE SARS-CoV-2 target nucleic acids are DETECTED. The SARS-CoV-2 RNA is generally detectable in upper and lower  respiratory specimens dur ing the acute phase of infection.  Positive  results are indicative of active infection with SARS-CoV-2.  Clinical  correlation with patient history and other diagnostic information is  necessary to determine patient infection status.  Positive results do  not rule out bacterial infection or co-infection with other viruses. If result is PRESUMPTIVE POSTIVE SARS-CoV-2 nucleic acids MAY BE PRESENT.   A presumptive positive result was obtained on the submitted specimen  and confirmed on repeat testing.  While 2019 novel coronavirus  (SARS-CoV-2) nucleic acids may be present in the submitted  sample  additional confirmatory testing may be necessary for epidemiological  and / or clinical management purposes  to differentiate between  SARS-CoV-2 and  other Sarbecovirus currently known to infect humans.  If clinically indicated additional testing with an alternate test  methodology [phone]) is advised. The SARS-CoV-2 RNA is generally  detectable in upper and lower respiratory sp ecimens during the acute  phase of infection. The expected result is Negative. Fact Sheet for Patients:  StrictlyIdeas.no Fact Sheet for Healthcare Providers: BankingDealers.co.za This test is not yet approved or cleared by the [place] FDA and has been authorized for detection and/or diagnosis of SARS-CoV-2 by FDA under an Emergency Use Authorization (EUA).  This EUA will remain in effect (meaning this test can be used) for the duration of the COVID-19 declaration under Section 564(b)(1) of the Act, 21 U.S.C. section 360bbb-3(b)(1), unless the authorization is terminated or revoked sooner. Performed at [facility], [place] [address] [place], [place] [address]       Radiology Studies: Dg Chest Port 1 View  Result Date: 05/28/2019 CLINICAL DATA:  Pulmonary edema EXAM: PORTABLE CHEST 1 VIEW COMPARISON:  05/26/2019 FINDINGS: Diffuse bilateral interstitial and alveolar airspace opacities similar in appearance to the prior exam. Calcified right upper lobe pulmonary nodule and left lower lobe pulmonary nodule consistent with sequela prior granulomatous disease. Trace bilateral pleural effusions. Hyperinflated bilateral lungs as can be seen with COPD. No pneumothorax. Stable cardiomegaly. Prior CABG. No acute osseous abnormality. IMPRESSION: Findings most concerning for persistent pulmonary edema. Electronically Signed   By: Kathreen Devoid   On: 05/28/2019 06:55     Scheduled Meds: . sodium chloride   Intravenous Once  . allopurinol  100  mg Oral Daily  . ALPRAZolam  0.25 mg Oral QHS  . atorvastatin  80 mg Oral q1800  . cholecalciferol  1,000 Units Oral Q lunch  . doxycycline  100 mg Oral Q12H  . furosemide  60 mg Intravenous BID  . hydrALAZINE  100 mg Oral TID  . insulin aspart  0-5 Units Subcutaneous QHS  . insulin aspart  0-9 Units Subcutaneous TID WC  . magnesium oxide  400 mg Oral Daily  . mouth rinse  15 mL Mouth Rinse BID  . metoprolol tartrate  12.5 mg Oral BID  . pantoprazole  40 mg Oral BID  . polyethylene glycol  17 g Oral Daily  . predniSONE  40 mg Oral Q breakfast  . spironolactone  25 mg Oral Daily  . tadalafil  40 mg Oral Daily  . vitamin B-12  1,000 mcg Oral Q lunch  . vitamin C  500 mg Oral Q lunch  . Warfarin - Pharmacist Dosing Inpatient   Does not apply q1800   Continuous Infusions:   LOS: 1 day    Time spent: 25 minutes spent in the coordination of care today w/ greater than 50% of time spent on counseling of autoimmune disorders and updates w/ consultants.   Jonnie Finner, DO [facility] Pager [phone]  If 7PM-7AM, please contact night-coverage [email or web address] Password [facility] 05/28/2019, 4:55 PM

## 2019-05-28 NOTE — Progress Notes (Signed)
   NAME:  [name], MRN:  [id number], DOB:  09-07-1941, LOS: 1 ADMISSION DATE:  05/26/2019, CONSULTATION DATE:  05/28/19 REFERRING MD:  Florene Glen, CHIEF COMPLAINT:  ANCA+   Brief History   History of chronic hemolytic anemia, chemical aortic valve replacement, HFpEF   -admitted for symptomatic anemia.    She is not noticing increased swelling or shortness of breath compared to her baseline.  Pulmonology has been consulted due to concern for pneumonitis pattern on chest xray   Past Medical History  Mechanical aortic valve replacement Atrial fibrillation Chronic Coumadin use Heart failure with preserved ejection fraction Pulmonary hypertension GI bleed Hemolytic anemia and thrombocytopenia Diabetes  Significant Hospital Events   8/17>> transfused 1 unit of packed red blood cells for hemoglobin 6.4, down from 7.6 on 05/19/2019.  Consults:  Pulmonary  Procedures:    Significant Diagnostic Tests:  CXR 8/17- suggestive of pulmonary edema p-ANCA +1:640, elevated MPO & PR3 on 05/08/2019 ESR 94<--114 on 730/20 CRP 3.3<-2.5 on 05/08/2019   Micro Data:  COVID negative  Antimicrobials:  Doxycycline  Interim history/subjective:   Feels and looks better Objective   Blood pressure (Abnormal) 154/59, pulse 65, temperature 97.7 F (36.5 C), temperature source Oral, resp. rate 20, height '5\' 2"'$  (1.575 m), weight 50.3 kg, SpO2 97 %.        Intake/Output Summary (Last 24 hours) at 05/28/2019 1138 Last data filed at 05/27/2019 1945 Gross per 24 hour  Intake 456 ml  Output 450 ml  Net 6 ml   Filed Weights   05/26/19 1351 05/27/19 0531 05/28/19 0417  Weight: 52.2 kg 50.6 kg 50.3 kg    Examination:  General this is a very pleasant 78 year old female patient resting in bed she is in no acute distress HEENT normocephalic atraumatic no jugular venous distention Pulmonary: Fine crackles in bases no accessory use Cardiac: Regular rate and rhythm as positive mechanical valve click  with prior valve placement Abdomen: Soft nontender Skin is cool with decreased edema  Resolved Hospital Problem list     Assessment & Plan:  Acute hypoxic respiratory failure likely due to pulmonary edema.   Mod to severe pulmonary HTN Chronic diastolic HF Prior mechanical aortic valve Atrial fibrillation Possible vasculitis: Positive p-ANCA with positive MPO and PR-3-this constellation is less specific, but still raises concern for vasculitis, especially with chronically elevated ESR and CRP.  H/o epistaxis  Symptomatic multi-factorial anemia (chronic inflammation, hemolysis CRI  Gout  Discussion Feels better after transfusion and lasix. She has known h/o mod->svr PH and diastolic HF. Her autoimmune studies are elevated but non-specific. Chart indicates h/o gout and has been seen by rheumatology as out-pt.   Her CXR looks a little better w/ decreased edema   Plan/rec Would cont diuresis Must have walking oximetry prior to dc (I am concerned she is hypoxic at home and we have not identified this. This would be a significant issue w/ her already identified Early).  Needs f/u w/ Rheum. I am not convinced that her pulmonary findings reflect an autoimmune process. The next step really should be BMBX as suspect the anemia is also playing a significant role in her heart failure  Will make her a follow up in our office   Will see again in am and f/u CXR  Erick Colace ACNP-BC [place] Pager # [phone] OR # [phone] if no answer

## 2019-05-28 NOTE — Progress Notes (Signed)
ANTICOAGULATION CONSULT NOTE -   Pharmacy Consult for warfarin Indication: atrial fibrillation and mechanical heart valve  Allergies  Allergen Reactions  . Flagyl [Metronidazole] Rash    ALL-OVER BODY RASH  . Coreg [Carvedilol] Other (See Comments)    Terrible cramping in the feet and had a lot of bowel movements, but not diarrhea  . Losartan Swelling    Patient doesn't recall site of swelling  . Verapamil Hives  . Zetia [Ezetimibe] Other (See Comments)    Reaction not recalled  . Zocor [Simvastatin - High Dose] Other (See Comments)    Reaction not recalled    Patient Measurements: Height: 5\' 2"  (157.5 cm) Weight: 110 lb 14.3 oz (50.3 kg) IBW/kg (Calculated) : 50.1   Vital Signs: Temp: 97.7 F (36.5 C) (08/19 0417) Temp Source: Oral (08/19 0417) BP: 154/59 (08/19 0417) Pulse Rate: 59 (08/19 0417)  Labs: Recent Labs    05/26/19 0844 05/26/19 1106 05/26/19 1735 05/27/19 0402 05/28/19 0404  HGB 6.4*  --  7.9* 6.9*  --   HCT 20.3*  --  24.9* 22.1*  --   PLT 155  --   --  138*  --   LABPROT  --   --  35.8* 42.0* 37.5*  INR  --   --  3.7* 4.5* 3.9*  CREATININE 1.94*  --   --  2.05*  --   TROPONINIHS 14 14  --   --   --     Estimated Creatinine Clearance: 17.3 mL/min (A) (by C-G formula based on SCr of 2.05 mg/dL (H)).   Medical History: Past Medical History:  Diagnosis Date  . Aortic atherosclerosis ([place]) 01/23/2018  . Atrial fibrillation ([place])   . Carotid artery disease ([place])   . Cholelithiasis 01/23/2018  . Chronic anticoagulation   . Coronary artery disease    status post coronary artery bypass grafting times 07/10/2004  . Diabetes mellitus   . Hypercholesteremia   . Hypertension   . Mechanical heart valve present    H. aortic valve replacement at the time of bypass surgery October 2005  . Moderate to severe pulmonary hypertension ([place])   . Peripheral arterial disease ([facility])    history of left common iliac artery PTA and stenting for a chronic total  occlusion 08/26/01     Assessment: Pt presents with SOB, not on O2 at baseline.  On warfarin for Hx of AFib and mechanical heart valve. Home warfarin dose is 5mg  on M,W,F and 7.5mg  all other days.    05/28/2019 INR 3.9, supratherapeutic Hgb 6.9 and Plts 138 on 8/18 DI: doxycycline: may increase warfarin effects  Goal of Therapy:  INR 2-3 (per anti-coag notes, INR goal changed 03/2018)   Plan:  Hold warfarin tonight Daily INR  Dolly Rias RPh 05/28/2019, 8:08 AM

## 2019-05-28 NOTE — Progress Notes (Signed)
HEMATOLOGY-ONCOLOGY PROGRESS NOTE  SUBJECTIVE: [name] states that she did not sleep well last night.  Thinks her breathing has improved.  She does report some intermittent dizziness.  She has not noticed any bleeding this morning.  States that she would like to be seen by the dietitian.  REVIEW OF SYSTEMS:   A comprehensive 14 point review of systems was negative except as noted in the HPI.  I have reviewed the past medical history, past surgical history, social history and family history with the patient and they are unchanged from previous note.   PHYSICAL EXAMINATION: ECOG PERFORMANCE STATUS: 1 - Symptomatic but completely ambulatory  Vitals:   05/28/19 1033 05/28/19 1325  BP:  (!) 177/69  Pulse: 65 (!) 57  Resp:  19  Temp:  98.2 F (36.8 C)  SpO2:  98%   Filed Weights   05/26/19 1351 05/27/19 0531 05/28/19 0417  Weight: 115 lb 1.3 oz (52.2 kg) 111 lb 8.8 oz (50.6 kg) 110 lb 14.3 oz (50.3 kg)    Intake/Output from previous day: 08/18 0701 - 08/19 0700 In: 696 [P.O.:240; Blood:456] Out: 450 [Urine:450]  GENERAL:alert, no distress and comfortable LUNGS: Decreased in the bases with normal breathing effort HEART: regular rate & rhythm with audible click, trace bilateral lower extremity edema ABDOMEN:abdomen soft, non-tender and normal bowel sounds Musculoskeletal:no cyanosis of digits and no clubbing  NEURO: alert & oriented x 3 with fluent speech, no focal motor/sensory deficits  LABORATORY DATA:  I have reviewed the data as listed CMP Latest Ref Rng & Units 05/27/2019 05/26/2019 05/21/2019  Glucose 70 - 99 mg/dL 215(H) 117(H) 146(H)  BUN 8 - 23 mg/dL 57(H) 53(H) 56(H)  Creatinine 0.44 - 1.00 mg/dL 2.05(H) 1.94(H) 2.25(H)  Sodium 135 - 145 mmol/L 133(L) 133(L) 133(L)  Potassium 3.5 - 5.1 mmol/L 4.4 4.2 4.0  Chloride 98 - 111 mmol/L 104 106 104  CO2 22 - 32 mmol/L 20(L) 20(L) 18(L)  Calcium 8.9 - 10.3 mg/dL 8.3(L) 8.3(L) 8.5(L)  Total Protein 6.5 - 8.1 g/dL 5.8(L)  6.0(L) -  Total Bilirubin 0.3 - 1.2 mg/dL 0.7 0.8 -  Alkaline Phos 38 - 126 U/L 49 53 -  AST 15 - 41 U/L 17 19 -  ALT 0 - 44 U/L 12 12 -    Lab Results  Component Value Date   WBC 5.7 05/28/2019   HGB 8.3 (L) 05/28/2019   HCT 25.6 (L) 05/28/2019   MCV 91.4 05/28/2019   PLT 181 05/28/2019   NEUTROABS 5.3 05/28/2019    Dg Chest Port 1 View  Result Date: 05/28/2019 CLINICAL DATA:  Pulmonary edema EXAM: PORTABLE CHEST 1 VIEW COMPARISON:  05/26/2019 FINDINGS: Diffuse bilateral interstitial and alveolar airspace opacities similar in appearance to the prior exam. Calcified right upper lobe pulmonary nodule and left lower lobe pulmonary nodule consistent with sequela prior granulomatous disease. Trace bilateral pleural effusions. Hyperinflated bilateral lungs as can be seen with COPD. No pneumothorax. Stable cardiomegaly. Prior CABG. No acute osseous abnormality. IMPRESSION: Findings most concerning for persistent pulmonary edema. Electronically Signed   By: Kathreen Devoid   On: 05/28/2019 06:55   Dg Chest Portable 1 View  Result Date: 05/26/2019 CLINICAL DATA:  Weakness and shortness of breath since last night. EXAM: PORTABLE CHEST 1 VIEW COMPARISON:  05/08/2019. FINDINGS: Stable enlarged cardiac silhouette and post CABG changes. Increased diffuse prominence of the interstitial markings, especially on the right, with probable mild interspersed airspace opacities. Kerley lines are noted at the right lung base. No pleural  fluid is seen. Bilateral calcified granulomata are again noted. Also noted are coarse left breast calcifications compatible with degenerated fibroadenomata. Diffuse osteopenia. Mild to moderate left and minimal right glenohumeral joint degenerative changes. IMPRESSION: 1. Worsening pulmonary edema or pneumonia/pneumonitis. 2. Stable cardiomegaly. Electronically Signed   By: Claudie Revering M.D.   On: 05/26/2019 09:01   Dg Chest Portable 1 View  Result Date: 05/08/2019 CLINICAL DATA:   Fever EXAM: PORTABLE CHEST 1 VIEW COMPARISON:  04/04/2019, 02/28/2019 FINDINGS: Post sternotomy changes. Cardiomegaly with vascular congestion. Diffuse bilateral interstitial and ground-glass opacity, possible edema. More confluent opacity at the right base may reflect superimposed pneumonia. No pneumothorax. IMPRESSION: 1. Cardiomegaly with vascular congestion and diffuse bilateral interstitial and ground-glass opacity suggesting pulmonary edema. 2. More confluent airspace disease at the right base, could reflect superimposed pneumonia Electronically Signed   By: Donavan Foil M.D.   On: 05/08/2019 02:25    ASSESSMENT: 78 y.o. [place] woman with multifactorial anemia, as follows: (a) anemia of chronic inflammation: As of May 2020 she has a SED rate of 95, CRP 1.1, positive ANA and RF; she has been referred to rheumatology. Inflammation makes iron stores relatively inaccessible for hematopoiesis and blunts the effect of native EPO (b) hemolysis (mild): she has a mildly elevated LDH and a DAT positive for complement, not IgG; this is c/w (a) above (c) anemia of renal insufficiency: inadequate EPO resonse to anemia                     (d) GIB/ iron deficiency in patient on lifelong anticoagulaton  (1) Feraheme received 03/01/2019, 03/20/2019, and 05/12/2019             (a) reticulocyte 03/07/2019 up from 43.4 a year ago to 191.8 after iron infusion  (b) reticulocyte 05/19/2019 208.5  (2) darbepoetin: starting 05/05/2019 at 175mg given every 2 weeks.    (3) prednisone 10 mg daily started on 05/19/2019               PLAN: Andraya's hemoglobin has improved to 8.3.  Her white blood cell count and platelet count have now normalized.  She does not require transfusion today.  Recommend aggressive rheumatologic intervention which will likely need to be done as an outpatient since rheumatology does not come to the hospital.  The only other suggestion from a  hematology standpoint is to consider a bone marrow biopsy to make sure that we are not overlooking something.  Dietitian consult has already been placed by the hospitalist.   LOS: 1 day   KMikey Bussing DNP, AGPCNP-BC, AOCNP 05/28/19

## 2019-05-28 NOTE — Consult Note (Signed)
   [facility] CM Inpatient Consult   05/28/2019  [name] June 22, 1941 [id number]   Patient chart reviewed for 30 day readmission and unplanned readmission risk score of 44%. Patient is currently active with [facility] for chronic disease management services.  Patient has been engaged by a [facility].   Will follow for progression and disposition plans.   Of note, [facility] Care Management services does not replace or interfere with any services that are arranged by inpatient case management or social work.  Netta Cedars, MSN, [facility] Liaison Nurse Mobile Phone [phone]  Toll free office [phone]

## 2019-05-29 ENCOUNTER — Encounter (HOSPITAL_COMMUNITY): Payer: Medicare HMO

## 2019-05-29 LAB — COMPREHENSIVE METABOLIC PANEL
ALT: 15 U/L (ref 0–44)
AST: 21 U/L (ref 15–41)
Albumin: 2.8 g/dL — ABNORMAL LOW (ref 3.5–5.0)
Alkaline Phosphatase: 47 U/L (ref 38–126)
Anion gap: 10 (ref 5–15)
BUN: 74 mg/dL — ABNORMAL HIGH (ref 8–23)
CO2: 23 mmol/L (ref 22–32)
Calcium: 8.4 mg/dL — ABNORMAL LOW (ref 8.9–10.3)
Chloride: 102 mmol/L (ref 98–111)
Creatinine, Ser: 2.15 mg/dL — ABNORMAL HIGH (ref 0.44–1.00)
GFR calc Af Amer: 24 mL/min — ABNORMAL LOW (ref >60)
GFR calc non Af Amer: 21 mL/min — ABNORMAL LOW (ref >60)
Glucose, Bld: 101 mg/dL — ABNORMAL HIGH (ref 70–99)
Potassium: 3.9 mmol/L (ref 3.5–5.1)
Sodium: 135 mmol/L (ref 135–145)
Total Bilirubin: 1.1 mg/dL (ref 0.3–1.2)
Total Protein: 6 g/dL — ABNORMAL LOW (ref 6.5–8.1)

## 2019-05-29 LAB — CBC WITH DIFFERENTIAL/PLATELET
Abs Immature Granulocytes: 0.03 10*3/uL (ref 0.00–0.07)
Basophils Absolute: 0 10*3/uL (ref 0.0–0.1)
Basophils Relative: 1 %
Eosinophils Absolute: 0.1 10*3/uL (ref 0.0–0.5)
Eosinophils Relative: 3 %
HCT: 24.9 % — ABNORMAL LOW (ref 36.0–46.0)
Hemoglobin: 8 g/dL — ABNORMAL LOW (ref 12.0–15.0)
Immature Granulocytes: 1 %
Lymphocytes Relative: 17 %
Lymphs Abs: 0.6 10*3/uL — ABNORMAL LOW (ref 0.7–4.0)
MCH: 29.9 pg (ref 26.0–34.0)
MCHC: 32.1 g/dL (ref 30.0–36.0)
MCV: 92.9 fL (ref 80.0–100.0)
Monocytes Absolute: 0.2 10*3/uL (ref 0.1–1.0)
Monocytes Relative: 6 %
Neutro Abs: 2.6 10*3/uL (ref 1.7–7.7)
Neutrophils Relative %: 72 %
Platelets: 201 10*3/uL (ref 150–400)
RBC: 2.68 MIL/uL — ABNORMAL LOW (ref 3.87–5.11)
RDW: 21 % — ABNORMAL HIGH (ref 11.5–15.5)
WBC: 3.6 10*3/uL — ABNORMAL LOW (ref 4.0–10.5)
nRBC: 0 % (ref 0.0–0.2)

## 2019-05-29 LAB — MAGNESIUM: Magnesium: 2 mg/dL (ref 1.7–2.4)

## 2019-05-29 LAB — PROTIME-INR
INR: 2.3 — ABNORMAL HIGH (ref 0.8–1.2)
Prothrombin Time: 24.7 seconds — ABNORMAL HIGH (ref 11.4–15.2)

## 2019-05-29 LAB — GLUCOSE, CAPILLARY
Glucose-Capillary: 88 mg/dL (ref 70–99)
Glucose-Capillary: 208 mg/dL — ABNORMAL HIGH (ref 70–99)
Glucose-Capillary: 308 mg/dL — ABNORMAL HIGH (ref 70–99)
Glucose-Capillary: 205 mg/dL — ABNORMAL HIGH (ref 70–99)

## 2019-05-29 MED ORDER — PRO-STAT SUGAR FREE PO LIQD
30.0000 mL | Freq: Two times a day (BID) | ORAL | Status: DC
  Administered 2019-05-29 – 2019-05-31 (×4): 30 mL via ORAL
  Filled 2019-05-29 (×4): qty 30

## 2019-05-29 MED ORDER — WARFARIN SODIUM 5 MG PO TABS
5.0000 mg | ORAL_TABLET | Freq: Once | ORAL | Status: AC
  Administered 2019-05-29: 5 mg via ORAL
  Filled 2019-05-29: qty 1

## 2019-05-29 NOTE — Progress Notes (Signed)
SATURATION QUALIFICATIONS: (This note is used to comply with regulatory documentation for home oxygen)  Patient Saturations on Room Air at Rest = 94%  Patient Saturations on Room Air while Ambulating =89%    

## 2019-05-29 NOTE — Progress Notes (Signed)
ANTICOAGULATION CONSULT NOTE -   Pharmacy Consult for warfarin Indication: atrial fibrillation and mechanical heart valve  Allergies  Allergen Reactions  . Flagyl [Metronidazole] Rash    ALL-OVER BODY RASH  . Coreg [Carvedilol] Other (See Comments)    Terrible cramping in the feet and had a lot of bowel movements, but not diarrhea  . Losartan Swelling    Patient doesn't recall site of swelling  . Verapamil Hives  . Zetia [Ezetimibe] Other (See Comments)    Reaction not recalled  . Zocor [Simvastatin - High Dose] Other (See Comments)    Reaction not recalled    Patient Measurements: Height: 5\' 2"  (157.5 cm) Weight: 110 lb 0.2 oz (49.9 kg) IBW/kg (Calculated) : 50.1   Vital Signs: Temp: 97.8 F (36.6 C) (08/20 0603) Temp Source: Oral (08/20 0603) BP: 147/74 (08/20 0603) Pulse Rate: 63 (08/20 0603)  Labs: Recent Labs    05/26/19 1106  05/27/19 0402 05/28/19 0404 05/28/19 1207 05/29/19 0420  HGB  --    < > 6.9*  --  8.3* 8.0*  HCT  --    < > 22.1*  --  25.6* 24.9*  PLT  --   --  138*  --  181 201  LABPROT  --    < > 42.0* 37.5*  --  24.7*  INR  --    < > 4.5* 3.9*  --  2.3*  CREATININE  --   --  2.05*  --   --  2.15*  TROPONINIHS 14  --   --   --   --   --    < > = values in this interval not displayed.    Estimated Creatinine Clearance: 16.4 mL/min (A) (by C-G formula based on SCr of 2.15 mg/dL (H)).   Medical History: Past Medical History:  Diagnosis Date  . Aortic atherosclerosis ([place]) 01/23/2018  . Atrial fibrillation ([place])   . Carotid artery disease ([place])   . Cholelithiasis 01/23/2018  . Chronic anticoagulation   . Coronary artery disease    status post coronary artery bypass grafting times 07/10/2004  . Diabetes mellitus   . Hypercholesteremia   . Hypertension   . Mechanical heart valve present    H. aortic valve replacement at the time of bypass surgery October 2005  . Moderate to severe pulmonary hypertension ([place])   . Peripheral arterial  disease (H[facility])    history of left common iliac artery PTA and stenting for a chronic total occlusion 08/26/01     Assessment: Pt presents with SOB, not on O2 at baseline.  On warfarin for Hx of AFib and mechanical heart valve. Home warfarin dose is 5mg  on M,W,F and 7.5mg  all other days.    05/29/2019 INR 2.3,therapeutic Hgb 8.0, plts 201 DI: doxycycline: may increase warfarin effects  Goal of Therapy:  INR 2-3 (per anti-coag notes, INR goal changed 03/2018)   Plan:  Warfarin 5mg  po x1 at 1800 Daily INR  [place] 05/29/2019, 10:33 AM

## 2019-05-29 NOTE — Progress Notes (Signed)
[name]  PROGRESS NOTE    [name]  ZOX:[id number] DOB: Feb 16, 1941 DOA: 05/26/2019 PCP: [name], MD   Brief Narrative:   78 y.o.femalewith medical history significant ofmechanical aortic valve on warfarin, pulmonary hypertension, CAD, diabetes, HTN, HLD and multiple other medical problems presenting after developing SOB overnight.She notes she was in her regular state of health on 8/16, but overnight she woke up short of breath. She denies CP, fever, chills. She does not orthopnea and PND. She notes she had some abdominal discomfort, nausea which has since resolved.Her stool is dark, she suspects due to iron.  She was recently discharged on 7/31 after developing fevers, chills, anda diffuserash(which she relates toAranespuse as she apparently had a flare again after her second shot. She received vanc/cefepime, doxycycline and was discharged with steroid taper.Work up in [place] revealed acute hypoxic respiratory failure requiring 3 L of nasal cannula, labs showed anemia (hemoglobin 7.6-->6.4) andchest x-ray showed pulmonary edema versus pneumonitis. Patient follows hematology Dr.Magrinatfor anemia/thrombocytopenia and receives Aranesp shots.   Assessment & Plan:   Active Problems:   Hypertension   Diabetes mellitus without complication (HCC)   Anemia   Pancytopenia (HCC)   Dyspnea   Positive P-ANCA titer   Dyspnea/acute hypoxic respiratory failure:      - Secondary to pulmonary edema versus anemia versus pneumonitis as seen on chest x-ray.       - Mildly elevated BNP (395).       - Recent echo from July showed preserved EF at 60 to 65%, pulmonary artery hypertension.      - Seen by PCCM who agreed with IV diuresis for now.       - Patient on tadalafil which has been resumed.      - Monitor I's and O's, daily weights and follow renal function closely.      - Monitor for fluid overload signs with blood transfusion     - continue diuresis as renal function  allows     - PCCM has s/o'd.     - need to switch to Po lasix soon     - PT working with pt; 6MWT ordered.  Pancytopenia:      - Patient followed by hematology closely.       - Patient receiving 2 units of PRBC.     - She had lab work drawn during her prior hospitalization which showed a normal C3 and C4 complement,normal double-stranded DNA antibody, normal cryoglobulin,elevated P-ANCA titer of 1:640, elevated myeloperoxidase antibody 20.6, and elevated ANCA proteinase 3 at 40.3.     - Recommendations per hematology were to proceed with a bone marrow biopsy.     - However, there is concern for possible vasculitis given her positive P-ANCA and positive MPO and PR-3.     -The bone marrow biopsy was placed on hold by hematology who would like her to follow-up with rheumatology for further evaluation of this.  If no improvement, bone marrow biopsy would be pursued.  Petechial rash/RMSF:     - pt presented late July with fever and petechial rash.      - She was treated initially with doxycycline, but it does not sound like this course was completed.      - She attributes the rash to the injections she's been getting for her Hb (sounds like aranesp).      - Per Dr. [name] note, this was thought to be less likely; Pt also noted to have positive IgM for RMSF; Resumed  on doxycycline and steroids on admission.  Positive p-ANCA:      - Given respiratory symptoms, possibility of eosinophilic granulomatosis with polyangiitis was suspected on admission.      - Pulmonary recommends deferring invasive work-up like biopsies of nasal mucosa/skin biopsy/renal biopsy as patient on anticoagulation for mechanical heart valve and clinical history equivocal/renal function relatively stable.  Mechanical Aortic Valve/Chronic Atrial Fibrillation:     - INR goal 2.5-3.5.        - Supratherapeutic today with INR at 3.9.       - Hold Warfarin and further adjustments per pharmacy  CKD IV:     -  creatinine appears to be close to baseline around 1.7-1.8.     - Follow with diuresis.     - Scr is stable at 2.15, monitor  Hypertension:      - continue metoprolol, spironolactone, lasix IV.      - Hold irbesartan.  CAD:      - continue lipitor, metoprolol.       - Not on aspirin  Anxiety:      - continue xanax  GERD, Hx GI Bleed:      - continue PPI BID  Gout:      - continue allopurinol  T2DM:      - hold metformin, start SSI  Peripheral vascular disease:      - Patient states she underwent work-up for peripheral circulation/PTCA by Dr. [name] and was scheduled to undergo arterial studies including Dopplers of lower extremities/carotid on Thursday; follow up outpt  DVT prophylaxis: Warfarin Code Status: FULL Family Communication: None at bedside   Disposition Plan: TBD  Consultants:   PCCM  Heme/Onc  ROS:  Denies CP, dyspnea, N/V . Remainder 10-pt ROS is negative for all not previously mentioned.  Subjective: "How come we don't have an answer?"  Objective: Vitals:   05/28/19 1325 05/28/19 2003 05/29/19 0603 05/29/19 1419  BP: (!) 177/69 (!) 167/63 (!) 147/74 (!) 162/51  Pulse: (!) 57 63 63 63  Resp: '19 18 18 19  ' Temp: 98.2 F (36.8 C) 97.8 F (36.6 C) 97.8 F (36.6 C) 98 F (36.7 C)  TempSrc: Oral Oral Oral Oral  SpO2: 98% 97% 92% 91%  Weight:   49.9 kg   Height:        Intake/Output Summary (Last 24 hours) at 05/29/2019 1731 Last data filed at 05/29/2019 1500 Gross per 24 hour  Intake 360 ml  Output 1120 ml  Net -760 ml   Filed Weights   05/27/19 0531 05/28/19 0417 05/29/19 0603  Weight: 50.6 kg 50.3 kg 49.9 kg    Examination:  General: 78 y.o. female resting in bed in NAD Eyes: PERRL, normal sclera ENMT: Nares patent w/o discharge, orophaynx clear, dentition normal, ears w/o discharge/lesions/ulcers Cardiovascular: RRR, +S1, S2, no m/g/r, equal pulses throughout Respiratory: CTABL, no w/r/r, normal WOB GI: BS+, NDNT, no  masses noted, no organomegaly noted MSK: No e/c/c Skin: No rashes, bruises, ulcerations noted Neuro: A&O x 3, no focal deficits   Data Reviewed: I have personally reviewed following labs and imaging studies.  CBC: Recent Labs  Lab 05/26/19 0844 05/26/19 1735 05/27/19 0402 05/28/19 1207 05/29/19 0420  WBC 4.2  --  2.8* 5.7 3.6*  NEUTROABS 3.5  --  2.4 5.3 2.6  HGB 6.4* 7.9* 6.9* 8.3* 8.0*  HCT 20.3* 24.9* 22.1* 25.6* 24.9*  MCV 98.1  --  94.4 91.4 92.9  PLT 155  --  138* 181 201  Basic Metabolic Panel: Recent Labs  Lab 05/26/19 0844 05/27/19 0402 05/29/19 0420  NA 133* 133* 135  K 4.2 4.4 3.9  CL 106 104 102  CO2 20* 20* 23  GLUCOSE 117* 215* 101*  BUN 53* 57* 74*  CREATININE 1.94* 2.05* 2.15*  CALCIUM 8.3* 8.3* 8.4*  MG  --   --  2.0   GFR: Estimated Creatinine Clearance: 16.4 mL/min (A) (by C-G formula based on SCr of 2.15 mg/dL (H)). Liver Function Tests: Recent Labs  Lab 05/26/19 0844 05/27/19 0402 05/29/19 0420  AST '19 17 21  ' ALT '12 12 15  ' ALKPHOS 53 49 47  BILITOT 0.8 0.7 1.1  PROT 6.0* 5.8* 6.0*  ALBUMIN 2.6* 2.4* 2.8*   No results for input(s): LIPASE, AMYLASE in the last 168 hours. No results for input(s): AMMONIA in the last 168 hours. Coagulation Profile: Recent Labs  Lab 05/26/19 1735 05/27/19 0402 05/28/19 0404 05/29/19 0420  INR 3.7* 4.5* 3.9* 2.3*   Cardiac Enzymes: No results for input(s): CKTOTAL, CKMB, CKMBINDEX, TROPONINI in the last 168 hours. BNP (last 3 results) No results for input(s): PROBNP in the last 8760 hours. HbA1C: No results for input(s): HGBA1C in the last 72 hours. CBG: Recent Labs  Lab 05/28/19 1658 05/28/19 2001 05/29/19 0804 05/29/19 1149 05/29/19 1627  GLUCAP 209* 250* 88 208* 308*   Lipid Profile: No results for input(s): CHOL, HDL, LDLCALC, TRIG, CHOLHDL, LDLDIRECT in the last 72 hours. Thyroid Function Tests: No results for input(s): TSH, T4TOTAL, FREET4, T3FREE, THYROIDAB in the last 72  hours. Anemia Panel: Recent Labs    05/26/19 1735  VITAMINB12 1,016*  FOLATE 7.3  FERRITIN 178  TIBC 239*  IRON 51   Sepsis Labs: Recent Labs  Lab 05/26/19 1735 05/27/19 0402 05/28/19 0404  PROCALCITON <0.10 0.14 0.12    Recent Results (from the past 240 hour(s))  SARS Coronavirus 2 [facility] order, Performed in [facility] hospital lab) Nasopharyngeal Nasopharyngeal Swab     Status: None   Collection Time: 05/26/19  8:44 AM   Specimen: Nasopharyngeal Swab  Result Value Ref Range Status   SARS Coronavirus 2 NEGATIVE NEGATIVE Final    Comment: (NOTE) If result is NEGATIVE SARS-CoV-2 target nucleic acids are NOT DETECTED. The SARS-CoV-2 RNA is generally detectable in upper and lower  respiratory specimens during the acute phase of infection. The lowest  concentration of SARS-CoV-2 viral copies this assay can detect is 250  copies / mL. A negative result does not preclude SARS-CoV-2 infection  and should not be used as the sole basis for treatment or other  patient management decisions.  A negative result may occur with  improper specimen collection / handling, submission of specimen other  than nasopharyngeal swab, presence of viral mutation(s) within the  areas targeted by this assay, and inadequate number of viral copies  (<250 copies / mL). A negative result must be combined with clinical  observations, patient history, and epidemiological information. If result is POSITIVE SARS-CoV-2 target nucleic acids are DETECTED. The SARS-CoV-2 RNA is generally detectable in upper and lower  respiratory specimens dur ing the acute phase of infection.  Positive  results are indicative of active infection with SARS-CoV-2.  Clinical  correlation with patient history and other diagnostic information is  necessary to determine patient infection status.  Positive results do  not rule out bacterial infection or co-infection with other viruses. If result is PRESUMPTIVE POSTIVE  SARS-CoV-2 nucleic acids MAY BE PRESENT.   A presumptive positive result  was obtained on the submitted specimen  and confirmed on repeat testing.  While 2019 novel coronavirus  (SARS-CoV-2) nucleic acids may be present in the submitted sample  additional confirmatory testing may be necessary for epidemiological  and / or clinical management purposes  to differentiate between  SARS-CoV-2 and other Sarbecovirus currently known to infect humans.  If clinically indicated additional testing with an alternate test  methodology [phone]) is advised. The SARS-CoV-2 RNA is generally  detectable in upper and lower respiratory sp ecimens during the acute  phase of infection. The expected result is Negative. Fact Sheet for Patients:  StrictlyIdeas.no Fact Sheet for Healthcare Providers: BankingDealers.co.za This test is not yet approved or cleared by the [place] FDA and has been authorized for detection and/or diagnosis of SARS-CoV-2 by FDA under an Emergency Use Authorization (EUA).  This EUA will remain in effect (meaning this test can be used) for the duration of the COVID-19 declaration under Section 564(b)(1) of the Act, 21 U.S.C. section 360bbb-3(b)(1), unless the authorization is terminated or revoked sooner. Performed at [facility], [place] [address] [place], [place] [address]   Culture, Urine     Status: Abnormal (Preliminary result)   Collection Time: 05/26/19  8:10 PM   Specimen: Urine, Random  Result Value Ref Range Status   Specimen Description   Final    URINE, RANDOM Performed at [facility], [place] [address] [place], [place] [address]    Special Requests   Final    NONE Performed at [facility], [address], [place], [place] [address]    Culture (A)  Final    >=100,000 COLONIES/mL KLEBSIELLA PNEUMONIAE SUSCEPTIBILITIES TO FOLLOW Performed at [facility], [place] [address] [place], [place] [address]    Report Status PENDING  Incomplete      Radiology Studies: Dg Chest Port 1 View  Result Date: 05/28/2019 CLINICAL DATA:  Pulmonary edema EXAM: PORTABLE CHEST 1 VIEW COMPARISON:  05/26/2019 FINDINGS: Diffuse bilateral interstitial and alveolar airspace opacities similar in appearance to the prior exam. Calcified right upper lobe pulmonary nodule and left lower lobe pulmonary nodule consistent with sequela prior granulomatous disease. Trace bilateral pleural effusions. Hyperinflated bilateral lungs as can be seen with COPD. No pneumothorax. Stable cardiomegaly. Prior CABG. No acute osseous abnormality. IMPRESSION: Findings most concerning for persistent pulmonary edema. Electronically Signed   By: Kathreen Devoid   On: 05/28/2019 06:55     Scheduled Meds: . sodium chloride   Intravenous Once  . allopurinol  100 mg Oral Daily  . ALPRAZolam  0.25 mg Oral QHS  . atorvastatin  80 mg Oral q1800  . cholecalciferol  1,000 Units Oral Q lunch  . doxycycline  100 mg Oral Q12H  . feeding supplement (PRO-STAT SUGAR FREE 64)  30 mL Oral BID  . furosemide  60 mg Intravenous BID  . hydrALAZINE  100 mg Oral TID  . insulin aspart  0-5 Units Subcutaneous QHS  . insulin aspart  0-9 Units Subcutaneous TID WC  . magnesium oxide  400 mg Oral Daily  . mouth rinse  15 mL Mouth Rinse BID  . metoprolol tartrate  12.5 mg Oral BID  . pantoprazole  40 mg Oral BID  . polyethylene glycol  17 g Oral Daily  . predniSONE  40 mg Oral Q breakfast  . spironolactone  25 mg Oral Daily  . tadalafil  40 mg Oral Daily  . vitamin B-12  1,000 mcg Oral Q  lunch  . vitamin C  500 mg Oral Q lunch  . Warfarin - Pharmacist Dosing Inpatient   Does not apply q1800   Continuous Infusions:   LOS: 2 days    Time spent: 25 minutes spent in the coordination of care today.   Jonnie Finner, DO [facility] Pager [phone]  If 7PM-7AM, please contact night-coverage  [email or web address] Password [facility] 05/29/2019, 5:31 PM

## 2019-05-29 NOTE — Progress Notes (Signed)
Nutrition Education Note RD working remotely.   RD consulted for nutrition education regarding fluid weight gain. Patient is currently on a Heart Healthy diet and has been consuming 50-100% of meals since admission. Will add in 30 ml prostat BID to increase nutrition intake without adding extra fluid and will change diet to 2 gram Na to limit sodium but not fat in her diet.   Will place handouts from the Academy of [employer] and [employer]etics into Discharge Instructions: "Low Sodium Nutrition Therapy," "Sodium Free Flavoring Tips," and "Sodium Content of Foods."   Expect good compliance.  Body mass index is 20.12 kg/m. Pt meets criteria for normal weight based on current BMI. Current weight is 110 lb and weight on admission (8/17) was 115 lb. Weight was previously stable (119-125 lb) since June. Labs and medications reviewed; 60 mg IV lasix BID, 25 mg aldactone/day.    No further nutrition interventions warranted at this time. RD contact information provided. If additional nutrition issues arise, please re-consult RD.      Jarome Matin, MS, RD, LDN, [facility] Inpatient Clinical Dietitian Pager # [phone] After hours/weekend pager # [phone]

## 2019-05-29 NOTE — Progress Notes (Signed)
HEMATOLOGY-ONCOLOGY PROGRESS NOTE  SUBJECTIVE: Shyniece states that she feels better this morning.  She is anxious to try to get up and walk some.  Nursing is requesting an order for physical and occupational therapy.  The dizziness that she had yesterday has resolved.  States that breathing has improved.  Denies chest discomfort.  She denies bleeding.  REVIEW OF SYSTEMS:   A comprehensive 14 point review of systems was negative except as noted in the HPI.  I have reviewed the past medical history, past surgical history, social history and family history with the patient and they are unchanged from previous note.   PHYSICAL EXAMINATION: ECOG PERFORMANCE STATUS: 1 - Symptomatic but completely ambulatory  Vitals:   05/28/19 2003 05/29/19 0603  BP: (!) 167/63 (!) 147/74  Pulse: 63 63  Resp: 18 18  Temp: 97.8 F (36.6 C) 97.8 F (36.6 C)  SpO2: 97% 92%   Filed Weights   05/27/19 0531 05/28/19 0417 05/29/19 0603  Weight: 111 lb 8.8 oz (50.6 kg) 110 lb 14.3 oz (50.3 kg) 110 lb 0.2 oz (49.9 kg)    Intake/Output from previous day: 08/19 0701 - 08/20 0700 In: 720 [P.O.:720] Out: 300 [Urine:300]  GENERAL:alert, no distress and comfortable LUNGS: Decreased in the bases with normal breathing effort HEART: regular rate & rhythm with audible click, trace bilateral lower extremity edema ABDOMEN:abdomen soft, non-tender and normal bowel sounds Musculoskeletal:no cyanosis of digits and no clubbing  NEURO: alert & oriented x 3 with fluent speech, no focal motor/sensory deficits  LABORATORY DATA:  I have reviewed the data as listed CMP Latest Ref Rng & Units 05/29/2019 05/27/2019 05/26/2019  Glucose 70 - 99 mg/dL 101(H) 215(H) 117(H)  BUN 8 - 23 mg/dL 74(H) 57(H) 53(H)  Creatinine 0.44 - 1.00 mg/dL 2.15(H) 2.05(H) 1.94(H)  Sodium 135 - 145 mmol/L 135 133(L) 133(L)  Potassium 3.5 - 5.1 mmol/L 3.9 4.4 4.2  Chloride 98 - 111 mmol/L 102 104 106  CO2 22 - 32 mmol/L 23 20(L) 20(L)  Calcium 8.9 -  10.3 mg/dL 8.4(L) 8.3(L) 8.3(L)  Total Protein 6.5 - 8.1 g/dL 6.0(L) 5.8(L) 6.0(L)  Total Bilirubin 0.3 - 1.2 mg/dL 1.1 0.7 0.8  Alkaline Phos 38 - 126 U/L 47 49 53  AST 15 - 41 U/L '21 17 19  ' ALT 0 - 44 U/L '15 12 12    ' Lab Results  Component Value Date   WBC 3.6 (L) 05/29/2019   HGB 8.0 (L) 05/29/2019   HCT 24.9 (L) 05/29/2019   MCV 92.9 05/29/2019   PLT 201 05/29/2019   NEUTROABS 2.6 05/29/2019    Dg Chest Port 1 View  Result Date: 05/28/2019 CLINICAL DATA:  Pulmonary edema EXAM: PORTABLE CHEST 1 VIEW COMPARISON:  05/26/2019 FINDINGS: Diffuse bilateral interstitial and alveolar airspace opacities similar in appearance to the prior exam. Calcified right upper lobe pulmonary nodule and left lower lobe pulmonary nodule consistent with sequela prior granulomatous disease. Trace bilateral pleural effusions. Hyperinflated bilateral lungs as can be seen with COPD. No pneumothorax. Stable cardiomegaly. Prior CABG. No acute osseous abnormality. IMPRESSION: Findings most concerning for persistent pulmonary edema. Electronically Signed   By: Kathreen Devoid   On: 05/28/2019 06:55   Dg Chest Portable 1 View  Result Date: 05/26/2019 CLINICAL DATA:  Weakness and shortness of breath since last night. EXAM: PORTABLE CHEST 1 VIEW COMPARISON:  05/08/2019. FINDINGS: Stable enlarged cardiac silhouette and post CABG changes. Increased diffuse prominence of the interstitial markings, especially on the right, with probable mild interspersed  airspace opacities. Kerley lines are noted at the right lung base. No pleural fluid is seen. Bilateral calcified granulomata are again noted. Also noted are coarse left breast calcifications compatible with degenerated fibroadenomata. Diffuse osteopenia. Mild to moderate left and minimal right glenohumeral joint degenerative changes. IMPRESSION: 1. Worsening pulmonary edema or pneumonia/pneumonitis. 2. Stable cardiomegaly. Electronically Signed   By: Claudie Revering M.D.   On:  05/26/2019 09:01   Dg Chest Portable 1 View  Result Date: 05/08/2019 CLINICAL DATA:  Fever EXAM: PORTABLE CHEST 1 VIEW COMPARISON:  04/04/2019, 02/28/2019 FINDINGS: Post sternotomy changes. Cardiomegaly with vascular congestion. Diffuse bilateral interstitial and ground-glass opacity, possible edema. More confluent opacity at the right base may reflect superimposed pneumonia. No pneumothorax. IMPRESSION: 1. Cardiomegaly with vascular congestion and diffuse bilateral interstitial and ground-glass opacity suggesting pulmonary edema. 2. More confluent airspace disease at the right base, could reflect superimposed pneumonia Electronically Signed   By: Donavan Foil M.D.   On: 05/08/2019 02:25    ASSESSMENT: 78 y.o. [place] woman with multifactorial anemia, as follows: (a) anemia of chronic inflammation: As of May 2020 she has a SED rate of 95, CRP 1.1, positive ANA and RF; she has been referred to rheumatology. Inflammation makes iron stores relatively inaccessible for hematopoiesis and blunts the effect of native EPO (b) hemolysis (mild): she has a mildly elevated LDH and a DAT positive for complement, not IgG; this is c/w (a) above (c) anemia of renal insufficiency: inadequate EPO resonse to anemia                     (d) GIB/ iron deficiency in patient on lifelong anticoagulaton  (1) Feraheme received 03/01/2019, 03/20/2019, and 05/12/2019             (a) reticulocyte 03/07/2019 up from 43.4 a year ago to 191.8 after iron infusion  (b) reticulocyte 05/19/2019 208.5  (2) darbepoetin: starting 05/05/2019 at 152mg given every 2 weeks.    (3) prednisone 10 mg daily started on 05/19/2019               PLAN: Alailah's hemoglobin stable at 8.0 today.  She does not require transfusion today.  Her platelets are completely normal but her total white blood cell count has drifted down again to 3.6.  However, her [place].  Recommend continued  monitoring of her CBC.   Recommend aggressive rheumatologic intervention.  Recommend for hospitalist to contact her rheumatologist, Dr. AGavin Pound to determine if she will see the patient at the hospital versus reviewing her lab work and making recommendations regarding treatment.  The patient is anxious to have something done while she is here at the hospital.  She states that she has some transportation issues making outpatient follow-up with rheumatology difficult for her to get to.  The only other suggestion from a hematology standpoint is to consider a bone marrow biopsy to make sure that we are not overlooking something.  I have placed referrals to physical and occupational therapy per the patient request.   LOS: 2 days   KMikey Bussing DNP, AGPCNP-BC, AOCNP 05/29/19

## 2019-05-29 NOTE — Discharge Instructions (Signed)
Low-Sodium Nutrition Therapy Eating less sodium can help you if you have high blood pressure, heart failure, or kidney or liver disease. Your body needs a little sodium, but too much sodium can cause your body to hold onto extra water.  This extra water will raise your blood pressure and can cause damage to your heart, kidneys, or liver as they are forced to work harder. Sometimes you can see how the extra fluid affects you because your hands, legs, or belly swell.  You may also hold water around your heart and lungs, which makes it hard to breathe. Even if you take medication for blood pressure or a water pill (diuretic) to remove fluid, it is still important to have less salt in your diet. Check with your primary care provider before drinking alcohol since it may affect the amount of fluid in your body and how your heart, kidneys, or liver work.  Sodium in Food A low-sodium meal plan limits the sodium that you get from food and beverages to 1,500-2,000 milligrams (mg) per day.  Salt is the main source of sodium.  Read the nutrition label on the package to find out how much sodium is in one serving of a food. Select foods with 140 milligrams (mg) of sodium or less per serving. You may be able to eat one or two servings of foods with a little more than 140 milligrams (mg) of sodium if you are closely watching how much sodium you eat in a day. Check the serving size on the label. The amount of sodium listed on the label shows the amount in one serving of the food.  So, if you eat more than one serving, you will get more sodium than the amount listed.  Cutting Back on Sodium Eat more fresh foods. Fresh fruits and vegetables are low in sodium, as well as frozen vegetables and fruits that have no added juices or sauces. Fresh meats are lower in sodium than processed meats, such as bacon, sausage, and hotdogs. Not all processed foods are unhealthy, but some processed foods may have too much  sodium. Eat less salt at the table and when cooking. One of the ingredients in salt is sodium. One teaspoon of table salt has 2,300 milligrams of sodium. Leave the salt out of recipes for pasta, casseroles, and soups. Be a Paramedic. Food packages that say Salt-free, sodium-free, very low sodium, and low sodium have less than 140 milligrams of sodium per serving. Beware of products identified as Unsalted, No Salt Added, Reduced Sodium, or Lower Sodium.  These items may still be high in sodium. You should always check the nutrition label. Add flavors to your food without adding sodium. Try lemon juice, lime juice, or vinegar. Dry or fresh herbs add flavor. Buy a sodium-free seasoning blend or make your own at home. You can purchase salt-free or sodium-free condiments like barbeque sauce in stores and online.   Eating in Restaurants Choose foods carefully when you eat outside your home. Restaurant foods can be very high in sodium.  Many restaurants provide nutrition facts on their menus or their websites.  If you cannot find that information, ask your server.  Let your server know that you want your food to be cooked without salt and that you would like your salad dressing and sauces to be served on the side.  Foods Recommended Grains Bread, bagels, rolls without salted tops Homemade bread made with reduced-sodium baking powder Cold cereals, especially shredded wheat and puffed rice Oats,  grits, or cream of wheat Pastas, quinoa, and rice Popcorn, pretzels or crackers without salt Corn tortillas Protein Foods Fresh meats and fish; [place] bacon (check the nutrition labels - make sure they are not packaged in a sodium solution) Canned or packed tuna (no more than 4 ounces at 1 serving) Beans and peas Soybeans) and tofu Eggs Nuts or nut butters without salt Dairy Milk or milk powder Plant milks, such as rice and soy Yogurt, including Greek yogurt Small amounts  of natural cheese (blocks of cheese) or reduced-sodium cheese can be used in moderation. (Swiss, ricotta, and fresh mozzarella cheese are lower in sodium than the others) Cream cheese Low sodium cottage cheese Vegetables Fresh and frozen vegetables without added sauces or salt Homemade soups (without salt) Low-sodium, salt-free or sodium-free canned vegetables and soups Fruit Fresh and canned fruits Dried fruits, such as raisins, cranberries, and prunes Oils Tub or liquid margarine, regular or without salt Canola, corn, peanut, olive, safflower, or sunflower oils Condiments Fresh or dried herbs such as basil, bay leaf, dill, mustard (dry), nutmeg, paprika, parsley, rosemary, sage, or thyme. Low sodium ketchup Vinegar Lemon or lime juice Pepper, red pepper flakes, and cayenne. Hot sauce contains sodium, but if you use just a drop or two, it will not add up to much. Salt-free or sodium-free seasoning mixes and marinades Simple salad dressings: vinegar and oil  Foods Not Recommended Grains Breads or crackers topped with salt Cereals (hot/cold) with more than 300 mg sodium per serving Biscuits, cornbread, and other quick breads prepared with baking soda Pre-packaged bread crumbs Seasoned and packaged rice and pasta mixes Self-rising flours Protein Foods Cured meats: Bacon, ham, sausage, pepperoni and hot dogs Canned meats (chili, vienna sausage, or sardines) Smoked fish and meats Frozen meals that have more than 600 mg of sodium per serving Egg substitute (with added sodium) Dairy Buttermilk Processed cheese spreads Cottage cheese (1 cup may have over 500 mg of sodium; look for low-sodium.) American or feta cheese Shredded Cheese has more sodium than blocks of cheese String cheese Vegetables Canned vegetables (unless they are salt-free, sodium-free or low sodium) Frozen vegetables with seasoning and sauces Sauerkraut and pickled vegetables Canned or dried soups (unless  they are salt-free, sodium-free, or low sodium) [place] fries and onion rings Fruit  Dried fruits preserved with additives that have sodium Oils  Salted butter or margarine, all types of olives Condiments Salt, sea salt, kosher salt, onion salt, and garlic salt Seasoning mixes with salt Bouillon cubes Ketchup Barbeque sauce and Worcestershire sauce unless low sodium Soy sauce Salsa, pickles, olives, relish Salad dressings: ranch, blue cheese, [place], and [place].  Low Sodium Sample 1-Day Menu Breakfast 1 cup cooked oatmeal 1 slice whole wheat bread toast 1 tablespoon peanut butter without salt 1 banana 1 cup 1% milk Lunch Tacos made with: 2 corn tortillas  cup black beans, low sodium  cup roasted or grilled chicken (without skin)  avocado Squeeze of lime juice 1 cup salad greens 1 tablespoon low-sodium salad dressing  cup strawberries 1 orange Afternoon Snack 1/3 cup grapes 6 ounces yogurt Evening Meal 3 ounces herb-baked fish 1 baked potato 2 teaspoons olive oil  cup cooked carrots 2 thick slices tomatoes on: 2 lettuce leaves 1 teaspoon olive oil 1 teaspoon balsamic vinegar 1 cup 1% milk Evening Snack 1 apple  cup almonds without salt  Low-Sodium Vegetarian (Lacto-Ovo) Sample 1-Day Menu Breakfast 1 cup cooked oatmeal 1 slice whole wheat toast 1 tablespoon peanut butter without salt 1 banana  1 cup 1% milk Lunch Tacos made with: 2 corn tortillas  cup black beans, low sodium  cup roasted or grilled chicken (without skin)  avocado Squeeze of lime juice 1 cup salad greens 1 tablespoon low-sodium salad dressing  cup strawberries 1 orange Evening Meal Stir fry made with:  cup tofu 1 cup brown rice  cup broccoli  cup green beans  cup peppers  tablespoon peanut oil 1 orange 1 cup 1% milk Evening Snack 4 strips celery 2 tablespoons hummus 1 hard-boiled egg  Low-Sodium Vegan Sample 1-Day Menu Breakfast 1 cup cooked oatmeal 1  tablespoon peanut butter without salt 1 cup blueberries 1 cup soymilk fortified with calcium, vitamin B12, and vitamin D Lunch 1 small whole wheat pita  cup cooked lentils 2 tablespoons hummus 4 carrot sticks 1 medium apple 1 cup soymilk fortified with calcium, vitamin B12, and vitamin D Evening Meal Stir fry made with:  cup tofu 1 cup brown rice  cup broccoli  cup green beans  cup peppers  tablespoon peanut oil 1 cup cantaloupe Evening Snack 1 cup soy yogurt  cup mixed nuts    Sodium-Free Flavoring Tips When cooking, the following items may be used for flavoring instead of salt or seasonings that contain sodium. Remember: A little bit of spice goes a long way! Be careful not to overseason. Spice Blend Recipe (makes about 1/2 cup) 5 teaspoons onion powder 2 teaspoons garlic powder 2 teaspoons paprika 2 teaspoon dry mustard 1 teaspoon crushed thyme leaves  teaspoon white pepper  teaspoon celery seed  Food Item Flavorings Beef  Basil, bay leaf, caraway, curry, dill, dry mustard, garlic, grape jelly, green pepper, mace, marjoram, mushrooms (fresh), nutmeg, onion or onion powder, parsley, pepper, rosemary, sage Chicken  Basil, cloves, cranberries, mace, mushrooms (fresh), nutmeg, oregano, paprika, parsley, pineapple, saffron, sage, savory, tarragon, thyme, tomato, turmeric Egg  Chervil, curry, dill, dry mustard, garlic or garlic powder, green pepper, jelly, mushrooms (fresh), nutmeg, onion powder, paprika, parsley, rosemary, tarragon, tomato Fish  Basil, bay leaf, chervil, curry, dill, dry mustard, green pepper, lemon juice, marjoram, mushrooms (fresh), paprika, pepper, tarragon, tomato, turmeric Lamb  Cloves, curry, dill, garlic or garlic powder, mace, mint, mint jelly, onion, oregano, parsley, pineapple, rosemary, tarragon, thyme Pork  Applesauce, basil, caraway, chives, cloves, garlic or garlic powder, onion or onion powder, rosemary, thyme Veal    Apricots, basil, bay leaf, currant jelly, curry, ginger, marjoram, mushrooms (fresh), oregano, paprika Vegetables  Basil, dill, garlic or garlic powder, ginger, lemon juice, mace, marjoram, nutmeg, onion or onion powder, tarragon, tomato, sugar or sugar substitute, salt-free salad dressing, vinegar Desserts  Allspice, anise, cinnamon, cloves, ginger, mace, nutmeg, vanilla extract, other extracts    Sodium (Salt) Content of Foods Eating more than the serving size for a moderate or low-sodium food will make it a high-sodium food.  Foods made with high-sodium foods will also be high in sodium. Unless otherwise noted, all foods are cooked: meat is roasted, fish is cooked with dry heat, vegetables are cooked from fresh, and fruit is raw. This is a guide. Actual values may vary depending on product and/or processing.  Canned and processed foods may have a higher sodium content. Values are rounded to the nearest 5-milligram (mg) increment and may be averaged with similar foods in the group.  High Sodium (more than 300 mg) Food Serving Milligrams (mg) Bacon 2 slices 790 Bagel, 4?: egg 1 each 450 Bagel, 4?: plain, onion, or seeded 1 each 400 Barbecue sauce 2 Tbsp 350 Beans,  baked, plain  cup 435 Beans, garbanzo  cup 360 Beans, kidney, canned  cup 440 Beans, lima, canned  cup 405 Beans, white, canned  cup 445 Beef, dried 1 oz. 790 Biscuit, 2? 1 each 350 Catsup 2 Tbsp 335 Cheese, American 1 oz 400 Cheese, cottage  cup 460 Cheese, feta 1 oz 315 Corn, creamed, canned  cup 365 Croissant 2 oz 425 Fish, salmon, canned 3 oz 470 Fish, salmon, smoked 3 oz 670 Fish, sardines, canned 3 oz 430 Frankfurter, beef or pork 1 each 510 Ham 3 oz 1,125 Lobster 3 oz 325 Miso  cup 1,280 Mushrooms, canned  cup 330 Pickle, dill 1 large 570 Potatoes, au gratin or scalloped  cup 500 Pretzels 1 oz 400 Pudding, instant, chocolate, prepared with milk  cup 420 Salad dressing, [place],  commercial 2 Tbsp 485 Salami, dry or hard 1 oz 600 Salt, table 1 tsp 2,325 Sauerkraut, canned  cup 780 Soup, canned 1 cup 700-1,000 Soy sauce 1 Tbsp 900 Spinach, canned, drained  cup 345 Teriyaki sauce 1 Tbsp 690 Tomato or vegetable juice, canned  cup 325 Tomato sauce, canned  cup 640 Tomato sauce, spaghetti or marinara  cup 510 Vegetable or soy patty 1 each 380  Moderate Sodium (140-300 mg) Food Serving Milligrams (mg) Asparagus, canned 4 spears 205 Beans, green or yellow, canned  cup 175 Beets, canned  cup 160 Bologna, pork and beef 1 oz 210 Bread, pita, 4? 1 each 150 Bread, pumpernickel or rye 1 slice 403 Bread, white 1 slice 474 Carrots, canned  cup 175 Cereal, raisin bran  cup 175 Cheese: [place], mozzarella, cheddar 1 oz 175 Cheese, Parmesan 2 Tbsp 150 Cheese, provolone, part-skim 1 oz 250 Cheese, ricotta  cup 155 Corn, canned  cup 285 Crab, canned 3 oz 240 English muffin 1 each 250 French fries 10 fries 200 Greens, beet  cup 175 Milk, buttermilk 1 cup 260 Milk, chocolate 1 cup 165 Milkshake 8 oz 240 Muffin 2 oz 250 Nuts, mixed, salted 1 oz 190 Olives, ripe, canned 5 large 190 Pancake or waffle, 4? 1 each 240 Peanuts, salted 1 oz 230 Peas, green, canned  cup 215 Potato chips 1 oz 190 Potatoes, mashed, prepared from dry mix  cup 170 Pudding, ready-to-eat  cup 160 Pudding, vanilla, from mix  cup 225 Roll, hot dog or hamburger 1 each 205 Salad dressing 2 Tbsp 200-300 Salsa 2 Tbsp 195 Sausage, pork 1 oz 200 Tomatoes, canned  cup 170 Tomatoes, stewed, canned  cup 280 Tortilla, flour, 6? 1 each 205 Tuna, canned in water 3 oz 290 Yogurt, plain or fruited 8 oz 100-175  Low Sodium (less than 140 mg) Food Serving Milligrams (mg) Bread, Italian 1 slice 259 Bread, wheat 1 slice 563 Butter, salted 1 Tbsp 90 Cereal, breakfast: corn, bran, or wheat  cup 100-150 Cheese, Swiss 1 oz 55 Egg substitute, liquid  cup 120 Egg, whole 1 large  70 Fish: pollock, swordfish, perch, cod, halibut, roughy, salmon 3 oz 60-100 Frozen yogurt  cup 65 Gelatin, prepared from mix  cup 100 Ice cream  cup 55 Margarine, regular 1 Tbsp 135 Milk, all types 1 cup 100 Milk, evaporated, canned  cup 135 Mustard 1 tsp 55 Peanut butter 1 Tbsp 75 Peas, green, frozen  cup 60 Seeds, sunflower 1 oz 115 Soy milk 1 cup 125 Spinach  cup 65 Spinach, frozen  cup 90 Sweet potato, baked in skin 1 medium 40 [place], light or dark meat 3 oz 60 Yogurt, plain or fruited 8  oz 100-175  Very Low Sodium (less than 35 mg) Food Serving Milligrams (mg) Apricots, canned  cup 5 Beef, ground 1 oz. 20 Beer, regular 12 oz 15 Broccoli  cup 30 Broccoli, raw  cup 15 Brussels sprouts  cup 15 Cabbage, raw or cooked  cup 5 Carbonated beverages 12 oz 20-40 Cauliflower  cup 10 Cauliflower, raw  cup 15 Dried beans and peas  cup 5-20 Greens: beet, collard, mustard  cup 10-20 Honeydew  cup 30 Lettuce, leaf 1 cup 15 Noodles  cup 10 Oatmeal  cup 5 Peaches, canned  cup 5 Pears, canned  cup 5 Pork 1 oz 25 Potato, baked with skin 1 medium 20 Rice, brown or wild  cup 5 Sherbet  cup 35 Soybeans  cup 15

## 2019-05-30 ENCOUNTER — Other Ambulatory Visit: Payer: Self-pay | Admitting: Oncology

## 2019-05-30 LAB — BPAM RBC
Blood Product Expiration Date: 202008192359
Blood Product Expiration Date: 202008232359
Blood Product Expiration Date: 202008302359
ISSUE DATE / TIME: 202008171206
ISSUE DATE / TIME: 202008181613
Unit Type and Rh: 6200
Unit Type and Rh: 6200
Unit Type and Rh: 6200

## 2019-05-30 LAB — CBC WITH DIFFERENTIAL/PLATELET
Abs Immature Granulocytes: 0.02 10*3/uL (ref 0.00–0.07)
Basophils Absolute: 0 10*3/uL (ref 0.0–0.1)
Basophils Relative: 1 %
Eosinophils Absolute: 0 10*3/uL (ref 0.0–0.5)
Eosinophils Relative: 1 %
HCT: 27.2 % — ABNORMAL LOW (ref 36.0–46.0)
Hemoglobin: 8.7 g/dL — ABNORMAL LOW (ref 12.0–15.0)
Immature Granulocytes: 1 %
Lymphocytes Relative: 19 %
Lymphs Abs: 0.7 10*3/uL (ref 0.7–4.0)
MCH: 29.7 pg (ref 26.0–34.0)
MCHC: 32 g/dL (ref 30.0–36.0)
MCV: 92.8 fL (ref 80.0–100.0)
Monocytes Absolute: 0.3 10*3/uL (ref 0.1–1.0)
Monocytes Relative: 8 %
Neutro Abs: 2.4 10*3/uL (ref 1.7–7.7)
Neutrophils Relative %: 70 %
Platelets: 206 10*3/uL (ref 150–400)
RBC: 2.93 MIL/uL — ABNORMAL LOW (ref 3.87–5.11)
RDW: 21 % — ABNORMAL HIGH (ref 11.5–15.5)
WBC: 3.4 10*3/uL — ABNORMAL LOW (ref 4.0–10.5)
nRBC: 0 % (ref 0.0–0.2)

## 2019-05-30 LAB — GLUCOSE, CAPILLARY
Glucose-Capillary: 116 mg/dL — ABNORMAL HIGH (ref 70–99)
Glucose-Capillary: 254 mg/dL — ABNORMAL HIGH (ref 70–99)
Glucose-Capillary: 293 mg/dL — ABNORMAL HIGH (ref 70–99)
Glucose-Capillary: 208 mg/dL — ABNORMAL HIGH (ref 70–99)

## 2019-05-30 LAB — URINE CULTURE: Culture: >=100000 — AB

## 2019-05-30 LAB — RENAL FUNCTION PANEL
Albumin: 2.8 g/dL — ABNORMAL LOW (ref 3.5–5.0)
Anion gap: 10 (ref 5–15)
BUN: 78 mg/dL — ABNORMAL HIGH (ref 8–23)
CO2: 23 mmol/L (ref 22–32)
Calcium: 8.9 mg/dL (ref 8.9–10.3)
Chloride: 103 mmol/L (ref 98–111)
Creatinine, Ser: 1.92 mg/dL — ABNORMAL HIGH (ref 0.44–1.00)
GFR calc Af Amer: 28 mL/min — ABNORMAL LOW (ref >60)
GFR calc non Af Amer: 24 mL/min — ABNORMAL LOW (ref >60)
Glucose, Bld: 123 mg/dL — ABNORMAL HIGH (ref 70–99)
Phosphorus: 3.4 mg/dL (ref 2.5–4.6)
Potassium: 3.9 mmol/L (ref 3.5–5.1)
Sodium: 136 mmol/L (ref 135–145)

## 2019-05-30 LAB — TYPE AND SCREEN
ABO/RH(D): A POS
Antibody Screen: NEGATIVE
Unit division: 0
Unit division: 0
Unit division: 0

## 2019-05-30 LAB — MAGNESIUM: Magnesium: 2.1 mg/dL (ref 1.7–2.4)

## 2019-05-30 LAB — PROTIME-INR
INR: 1.3 — ABNORMAL HIGH (ref 0.8–1.2)
Prothrombin Time: 16 seconds — ABNORMAL HIGH (ref 11.4–15.2)

## 2019-05-30 MED ORDER — FUROSEMIDE 40 MG PO TABS
60.0000 mg | ORAL_TABLET | Freq: Two times a day (BID) | ORAL | Status: DC
  Administered 2019-05-30 – 2019-05-31 (×2): 60 mg via ORAL
  Filled 2019-05-30 (×2): qty 1

## 2019-05-30 MED ORDER — GUAIFENESIN 100 MG/5ML PO SOLN: 10.0000 mL | ORAL | Status: DC | PRN

## 2019-05-30 MED ORDER — WARFARIN SODIUM 5 MG PO TABS
10.0000 mg | ORAL_TABLET | Freq: Once | ORAL | Status: AC
  Administered 2019-05-30: 10 mg via ORAL
  Filled 2019-05-30: qty 2

## 2019-05-30 NOTE — Evaluation (Signed)
Occupational Therapy Evaluation Patient Details Name: [name] MRN: [id number] DOB: 1941-05-28 Today's Date: 05/30/2019    History of Present Illness 78 y.o. female with medical history significant of mechanical aortic valve on warfarin, pulmonary hypertension, CAD, diabetes, HTN, HLD and multiple other medical problems presenting after developing SOB overnight.   Clinical Impression   Pt admitted with the above diagnoses and presents with below problem list. Pt will benefit from continued acute OT to address the below listed deficits and maximize independence with basic ADLs prior to d/c home. PTA pt was mod I with ADLs. Pt reports feeling better today compared to yesterday, some residual fatigue. Pt currently setup to supervision/min guard with ADLs, functional mobility and toilet/tub shower transfers. Discussed energy conservation and fall prevention strategies.      Follow Up Recommendations  No OT follow up    Equipment Recommendations  None recommended by OT    Recommendations for Other Services       Precautions / Restrictions Precautions Precautions: Fall Restrictions Weight Bearing Restrictions: No      Mobility Bed Mobility               General bed mobility comments: sitting EOB  Transfers Overall transfer level: Needs assistance Equipment used: Rolling walker (2 wheeled) Transfers: Sit to/from Stand Sit to Stand: Supervision              Balance Overall balance assessment: Needs assistance         Standing balance support: Bilateral upper extremity supported;During functional activity Standing balance-Leahy Scale: Fair Standing balance comment: can static stand with no UE support. support needed for dynamic standing and gait                           ADL either performed or assessed with clinical judgement   ADL Overall ADL's : Needs assistance/impaired Eating/Feeding: Set up;Sitting   Grooming: Set up;Min  guard;Sitting;Standing   Upper Body Bathing: Set up;Sitting   Lower Body Bathing: Supervison/ safety;Min guard;Sit to/from stand   Upper Body Dressing : Sitting;Set up   Lower Body Dressing: Supervision/safety;Min guard;Sit to/from stand   Toilet Transfer: Supervision/safety;Min guard;RW   Toileting- Water quality scientist and Hygiene: Supervision/safety;Min guard;Sit to/from stand   Tub/ Shower Transfer: Supervision/safety;Min guard;Ambulation;Rolling walker;Tub transfer Tub/Shower Transfer Details (indicate cue type and reason): simulated in bathroom Functional mobility during ADLs: Min guard;Rolling walker General ADL Comments: Energy conservation and fall prevention education provided.      Vision Baseline Vision/History: Wears glasses       Perception     Praxis      Pertinent Vitals/Pain Pain Assessment: No/denies pain     Hand Dominance Right   Extremity/Trunk Assessment Upper Extremity Assessment Upper Extremity Assessment: Generalized weakness;Overall [facility] for tasks assessed   Lower Extremity Assessment Lower Extremity Assessment: Defer to PT evaluation       Communication Communication Communication: No difficulties   Cognition Arousal/Alertness: Awake/alert Behavior During Therapy: WFL for tasks assessed/performed Overall Cognitive Status: Within Functional Limits for tasks assessed                                     General Comments       Exercises     Shoulder Instructions      Home Living Family/patient expects to be discharged to:: Private residence Living Arrangements: Alone Available Help at Discharge: Family;Friend(s);Available PRN/intermittently  Type of Home: House Home Access: Stairs to enter [employer] of Steps: 3 Entrance Stairs-Rails: Right Home Layout: One level     Bathroom Shower/Tub: Teacher, early years/pre: Handicapped height Bathroom Accessibility: Yes   Home Equipment: Walker -  2 wheels;Grab bars - tub/shower          Prior Functioning/Environment Level of Independence: Independent                 OT Problem List: Impaired balance (sitting and/or standing);Decreased knowledge of use of DME or AE;Decreased knowledge of precautions      OT Treatment/Interventions: Self-care/ADL training;Therapeutic exercise;Energy conservation;DME and/or AE instruction;Therapeutic activities;Patient/family education;Balance training    OT Goals(Current goals can be found in the care plan section) Acute Rehab OT Goals Patient Stated Goal: age in place as long as she can OT Goal Formulation: With patient Time For Goal Achievement: 06/06/19 Potential to Achieve Goals: Good ADL Goals Pt Will Perform Grooming: with modified independence;sitting;standing Pt Will Perform Tub/Shower Transfer: Tub transfer;with modified independence;ambulating Pt/caregiver will Perform Home Exercise Program: Both right and left upper extremity;With theraband;With written HEP provided;Independently  OT Frequency: Min 2X/week   Barriers to D/C:            Co-evaluation              AM-PAC OT "6 Clicks" Daily Activity     Outcome Measure Help from another person eating meals?: None Help from another person taking care of personal grooming?: None Help from another person toileting, which includes using toliet, bedpan, or urinal?: None Help from another person bathing (including washing, rinsing, drying)?: A Little Help from another person to put on and taking off regular upper body clothing?: None Help from another person to put on and taking off regular lower body clothing?: None 6 Click Score: 23   End of Session Equipment Utilized During Treatment: Rolling walker  Activity Tolerance: Patient tolerated treatment well Patient left: in bed;with call bell/phone within reach;Other (comment)(sitting EOB)  OT Visit Diagnosis: Unsteadiness on feet (R26.81);Muscle weakness (generalized)  (M62.81)                Time: 7903-8333 OT Time Calculation (min): 11 min Charges:  OT General Charges $OT Visit: 1 Visit OT Evaluation $OT Eval Low Complexity: [place], OT Acute Rehabilitation Services Pager: [phone] Office: [phone]   Hortencia Pilar 05/30/2019, 1:04 PM

## 2019-05-30 NOTE — Progress Notes (Signed)
ANTICOAGULATION CONSULT NOTE -   Pharmacy Consult for warfarin Indication: atrial fibrillation and mechanical heart valve  Allergies  Allergen Reactions  . Flagyl [Metronidazole] Rash    ALL-OVER BODY RASH  . Coreg [Carvedilol] Other (See Comments)    Terrible cramping in the feet and had a lot of bowel movements, but not diarrhea  . Losartan Swelling    Patient doesn't recall site of swelling  . Verapamil Hives  . Zetia [Ezetimibe] Other (See Comments)    Reaction not recalled  . Zocor [Simvastatin - High Dose] Other (See Comments)    Reaction not recalled    Patient Measurements: Height: 5\' 2"  (157.5 cm) Weight: 110 lb 7.2 oz (50.1 kg) IBW/kg (Calculated) : 50.1   Vital Signs: Temp: 97.7 F (36.5 C) (08/21 0407) Temp Source: Oral (08/21 0407) BP: 165/64 (08/21 0407) Pulse Rate: 61 (08/21 0407)  Labs: Recent Labs    05/28/19 0404  05/28/19 1207 05/29/19 0420 05/30/19 0337  HGB  --    < > 8.3* 8.0* 8.7*  HCT  --   --  25.6* 24.9* 27.2*  PLT  --   --  181 201 206  LABPROT 37.5*  --   --  24.7* 16.0*  INR 3.9*  --   --  2.3* 1.3*  CREATININE  --   --   --  2.15* 1.92*   < > = values in this interval not displayed.    Estimated Creatinine Clearance: 18.5 mL/min (A) (by C-G formula based on SCr of 1.92 mg/dL (H)).   Medical History: Past Medical History:  Diagnosis Date  . Aortic atherosclerosis ([place]) 01/23/2018  . Atrial fibrillation ([place])   . Carotid artery disease ([place])   . Cholelithiasis 01/23/2018  . Chronic anticoagulation   . Coronary artery disease    status post coronary artery bypass grafting times 07/10/2004  . Diabetes mellitus   . Hypercholesteremia   . Hypertension   . Mechanical heart valve present    H. aortic valve replacement at the time of bypass surgery October 2005  . Moderate to severe pulmonary hypertension ([place])   . Peripheral arterial disease ([facility])    history of left common iliac artery PTA and stenting for a chronic total  occlusion 08/26/01     Assessment: Pt presents with SOB, not on O2 at baseline.  On warfarin for Hx of AFib and mechanical heart valve. Home warfarin dose is 5mg  on M,W,F and 7.5mg  all other days.    05/30/2019 INR 1.3,subtherapeutic Hgb 8.7, plts 206 DI: doxycycline: may increase warfarin effects  Goal of Therapy:  INR 2-3 (per anti-coag notes, INR goal changed 03/2018)   Plan:  Warfarin 10mg  po x1 at 1800 Daily INR  [place] 05/30/2019, 11:40 AM

## 2019-05-30 NOTE — Progress Notes (Signed)
HEMATOLOGY-ONCOLOGY PROGRESS NOTE  SUBJECTIVE: [name] continues to feel better.  She has no dizziness, chest discomfort, or shortness of breath.  She has been ambulating back and forth to the bathroom without any difficulty.  She denies bleeding.  REVIEW OF SYSTEMS:   A comprehensive 14 point review of systems was negative except as noted in the HPI.  I have reviewed the past medical history, past surgical history, social history and family history with the patient and they are unchanged from previous note.   PHYSICAL EXAMINATION: ECOG PERFORMANCE STATUS: 1 - Symptomatic but completely ambulatory  Vitals:   05/29/19 2212 05/30/19 0407  BP: (!) 167/51 (!) 165/64  Pulse: 64 61  Resp: 15 20  Temp: 98.4 F (36.9 C) 97.7 F (36.5 C)  SpO2: 96% 94%   Filed Weights   05/28/19 0417 05/29/19 0603 05/30/19 0407  Weight: 110 lb 14.3 oz (50.3 kg) 110 lb 0.2 oz (49.9 kg) 110 lb 7.2 oz (50.1 kg)    Intake/Output from previous day: 08/20 0701 - 08/21 0700 In: 840 [P.O.:840] Out: 2670 [Urine:2670]  GENERAL:alert, no distress and comfortable LUNGS: Decreased in the bases with normal breathing effort HEART: regular rate & rhythm with audible click, trace bilateral lower extremity edema ABDOMEN:abdomen soft, non-tender and normal bowel sounds Musculoskeletal:no cyanosis of digits and no clubbing  NEURO: alert & oriented x 3 with fluent speech, no focal motor/sensory deficits  LABORATORY DATA:  I have reviewed the data as listed CMP Latest Ref Rng & Units 05/30/2019 05/29/2019 05/27/2019  Glucose 70 - 99 mg/dL 123(H) 101(H) 215(H)  BUN 8 - 23 mg/dL 78(H) 74(H) 57(H)  Creatinine 0.44 - 1.00 mg/dL 1.92(H) 2.15(H) 2.05(H)  Sodium 135 - 145 mmol/L 136 135 133(L)  Potassium 3.5 - 5.1 mmol/L 3.9 3.9 4.4  Chloride 98 - 111 mmol/L 103 102 104  CO2 22 - 32 mmol/L 23 23 20(L)  Calcium 8.9 - 10.3 mg/dL 8.9 8.4(L) 8.3(L)  Total Protein 6.5 - 8.1 g/dL - 6.0(L) 5.8(L)  Total Bilirubin 0.3 - 1.2 mg/dL  - 1.1 0.7  Alkaline Phos 38 - 126 U/L - 47 49  AST 15 - 41 U/L - 21 17  ALT 0 - 44 U/L - 15 12    Lab Results  Component Value Date   WBC 3.4 (L) 05/30/2019   HGB 8.7 (L) 05/30/2019   HCT 27.2 (L) 05/30/2019   MCV 92.8 05/30/2019   PLT 206 05/30/2019   NEUTROABS 2.4 05/30/2019    Dg Chest Port 1 View  Result Date: 05/28/2019 CLINICAL DATA:  Pulmonary edema EXAM: PORTABLE CHEST 1 VIEW COMPARISON:  05/26/2019 FINDINGS: Diffuse bilateral interstitial and alveolar airspace opacities similar in appearance to the prior exam. Calcified right upper lobe pulmonary nodule and left lower lobe pulmonary nodule consistent with sequela prior granulomatous disease. Trace bilateral pleural effusions. Hyperinflated bilateral lungs as can be seen with COPD. No pneumothorax. Stable cardiomegaly. Prior CABG. No acute osseous abnormality. IMPRESSION: Findings most concerning for persistent pulmonary edema. Electronically Signed   By: Kathreen Devoid   On: 05/28/2019 06:55   Dg Chest Portable 1 View  Result Date: 05/26/2019 CLINICAL DATA:  Weakness and shortness of breath since last night. EXAM: PORTABLE CHEST 1 VIEW COMPARISON:  05/08/2019. FINDINGS: Stable enlarged cardiac silhouette and post CABG changes. Increased diffuse prominence of the interstitial markings, especially on the right, with probable mild interspersed airspace opacities. Kerley lines are noted at the right lung base. No pleural fluid is seen. Bilateral calcified granulomata are  again noted. Also noted are coarse left breast calcifications compatible with degenerated fibroadenomata. Diffuse osteopenia. Mild to moderate left and minimal right glenohumeral joint degenerative changes. IMPRESSION: 1. Worsening pulmonary edema or pneumonia/pneumonitis. 2. Stable cardiomegaly. Electronically Signed   By: Claudie Revering M.D.   On: 05/26/2019 09:01   Dg Chest Portable 1 View  Result Date: 05/08/2019 CLINICAL DATA:  Fever EXAM: PORTABLE CHEST 1 VIEW  COMPARISON:  04/04/2019, 02/28/2019 FINDINGS: Post sternotomy changes. Cardiomegaly with vascular congestion. Diffuse bilateral interstitial and ground-glass opacity, possible edema. More confluent opacity at the right base may reflect superimposed pneumonia. No pneumothorax. IMPRESSION: 1. Cardiomegaly with vascular congestion and diffuse bilateral interstitial and ground-glass opacity suggesting pulmonary edema. 2. More confluent airspace disease at the right base, could reflect superimposed pneumonia Electronically Signed   By: Donavan Foil M.D.   On: 05/08/2019 02:25    ASSESSMENT: 78 y.o. [place] woman with multifactorial anemia, as follows: (a) anemia of chronic inflammation: As of May 2020 she has a SED rate of 95, CRP 1.1, positive ANA and RF; she has been referred to rheumatology. Inflammation makes iron stores relatively inaccessible for hematopoiesis and blunts the effect of native EPO (b) hemolysis (mild): she has a mildly elevated LDH and a DAT positive for complement, not IgG; this is c/w (a) above (c) anemia of renal insufficiency: inadequate EPO resonse to anemia                     (d) GIB/ iron deficiency in patient on lifelong anticoagulaton  (1) Feraheme received 03/01/2019, 03/20/2019, and 05/12/2019             (a) reticulocyte 03/07/2019 up from 43.4 a year ago to 191.8 after iron infusion  (b) reticulocyte 05/19/2019 208.5  (2) darbepoetin: starting 05/05/2019 at 135mcg given every 2 weeks.    (3) prednisone 10 mg daily started on 05/19/2019               PLAN: [name]'s hemoglobin improved to 8.7 today.  She does not need a transfusion.  Her platelets remain normal and her total white blood cell count is mildly decreased at 3.4.  No additional intervention is needed from this standpoint.  With regards to her possible vasculitis diagnosis, we were unable to contact her rheumatologist.  We have increased her prednisone to 40  mg daily.  We will plan to schedule her for outpatient rituximab upon hospital discharge.  She will see [place] as previously scheduled on Monday, 06/02/2019 for repeat lab work and further discussion of proceeding with medication.  From a hematology standpoint, the patient may discharge to home when medically stable.   LOS: 3 days   Mikey Bussing, DNP, AGPCNP-BC, AOCNP 05/30/19

## 2019-05-30 NOTE — Progress Notes (Signed)
[name]  PROGRESS NOTE    [name]  RPR:[id number] DOB: March 17, 1941 DOA: 05/26/2019 PCP: [name], MD   Brief Narrative:   78 y.o.femalewith medical history significant ofmechanical aortic valve on warfarin, pulmonary hypertension, CAD, diabetes, HTN, HLD and multiple other medical problems presenting after developing SOB overnight.She notes she was in her regular state of health on 8/16, but overnight she woke up short of breath. She denies CP, fever, chills. She does not orthopnea and PND. She notes she had some abdominal discomfort, nausea which has since resolved.Her stool is dark, she suspects due to iron.  She was recently discharged on 7/31 after developing fevers, chills, anda diffuserash(which she relates toAranespuse as she apparently had a flare again after her second shot. She received vanc/cefepime, doxycycline and was discharged with steroid taper.Work up in [place] revealed acute hypoxic respiratory failure requiring 3 L of nasal cannula, labs showed anemia (hemoglobin 7.6-->6.4) andchest x-ray showed pulmonary edema versus pneumonitis. Patient follows hematology Dr.Magrinatfor anemia/thrombocytopenia and receives Aranesp shots.   Assessment & Plan:   Active Problems:   Hypertension   Diabetes mellitus without complication (HCC)   Anemia   Pancytopenia (HCC)   Dyspnea   Vasculitis, ANCA positive (HCC)  Dyspnea/acute hypoxic respiratory failure:  - Secondary to pulmonary edema versus anemia versus pneumonitis as seen on chest x-ray.  - Mildly elevated BNP (395).  - Recent echo from July showed preserved EF at 60 to 65%, pulmonary artery hypertension.  - Seen by PCCM who agreed with IV diuresis for now.  - Patient on tadalafil which has been resumed.  - Monitor I's and O's, daily weights and follow renal function closely.  - Monitor for fluid overload signs with blood transfusion - continue diuresis as renal  function allows - PCCM has s/o'd.     - need to switch to Po lasix soon     - PT working with pt; 6MWT ordered.     - transition to PO lasix 42m BID     - steroids increased to 455mprednisone  Pancytopenia:  - Patient followed by hematology closely.  - Patient receiving 2 units of PRBC. - She had lab work drawn during her prior hospitalization which showed a normal C3 and C4 complement,normal double-stranded DNA antibody, normal cryoglobulin,elevated P-ANCA titer of 1:640, elevated myeloperoxidase antibody 20.6, and elevated ANCA proteinase 3 at 40.3. - Recommendations per hematology were to proceed with a bone marrow biopsy. - However, there is concern for possible vasculitis given her positive P-ANCA and positive MPO and PR-3. -The bone marrow biopsy was placed on hold by hematology who would like her to follow-up with rheumatology for further evaluation of this. If no improvement, bone marrow biopsy would be pursued.     - Hgb holding 8.7 this AM     - Onco to start ritux outpt on Monday  Petechial rash/RMSF: - pt presented late July with fever and petechial rash.  - She was treated initially with doxycycline, but it does not sound like this course was completed.  - She attributes the rash to the injections she's been getting for her Hb (sounds like aranesp).  - Per Dr. MaVirgie Dadote, this was thought to be less likely; Pt also noted to have positive IgM for RMSF; Resumed on doxycycline and steroids on admission.  Positive p-ANCA:  - Given respiratory symptoms, possibility of eosinophilic granulomatosis with polyangiitis was suspected on admission. - Pulmonary recommends deferring invasive work-up like biopsies of nasal mucosa/skin biopsy/renal biopsy as  patient on anticoagulation for mechanical heart valve and clinical history equivocal/renal function relatively stable.  Mechanical Aortic Valve/Chronic Atrial  Fibrillation: - INR goal 2.5-3.5.   - Supratherapeutic today with INR at 3.9.  - Hold Warfarin and further adjustments per pharmacy     - INR down to 1.3; pharm dosing, heparin today?  CKD IV: - creatinine appears to be close to baseline around 1.7-1.8. - Follow with diuresis.     - Scr is stable at 2.15, monitor     - Scr down to 1.9 today  Hypertension:  - continue metoprolol, spironolactone, lasix IV.  - Hold irbesartan.  CAD:  - continue lipitor, metoprolol.  - Not on aspirin  Anxiety:  - continue xanax  GERD, Hx GI Bleed:  - continue PPI BID  Gout:  - continue allopurinol  T2DM:  - hold metformin, start SSI  Peripheral vascular disease:  - Patient states she underwent work-up for peripheral circulation/PTCA by Dr. [name] and was scheduled to undergo arterial studies including Dopplers of lower extremities/carotid on Thursday; follow up outpt   DVT prophylaxis:Warfarin Code Status:FULL Family Communication:None at bedside Disposition Plan:TBD  Consultants:  PCCM  Heme/Onc  ROS:  Denies dyspnea, CP, N/V. Remainder 10-pt ROS is negative for all not previously mentioned.  Subjective: "My hemoglobin is good today!"  Objective: Vitals:   05/29/19 0603 05/29/19 1419 05/29/19 2212 05/30/19 0407  BP: (!) 147/74 (!) 162/51 (!) 167/51 (!) 165/64  Pulse: 63 63 64 61  Resp: _0 Temp: 97.8 F (36.6 C) 98 F (36.7 C) 98.4 F (36.9 C) 97.7 F (36.5 C)  TempSrc: Oral Oral Oral Oral  SpO2: 92% 91% 96% 94%  Weight: 49.9 kg   50.1 kg  Height:        Intake/Output Summary (Last 24 hours) at 05/30/2019 1058 Last data filed at 05/30/2019 1043 Gross per 24 hour  Intake 720 ml  Output 3170 ml  Net -2450 ml   Filed Weights   05/28/19 0417 05/29/19 0603 05/30/19 0407  Weight: 50.3 kg 49.9 kg 50.1 kg    Examination:  General:78 y.o.femaleresting in bed in NAD Eyes:  PERRL, normal sclera ENMT: Nares patent w/o discharge, orophaynx clear, dentition normal, ears w/o discharge/lesions/ulcers Cardiovascular: RRR, +S1, S2, no m/g/r, click noted (mechanical valve) equal pulses throughout Respiratory: CTABL, no w/r/r, normal WOB GI: BS+, NDNT, no masses noted, no organomegaly noted MSK: No e/c/c Skin: No rashes, bruises, ulcerations noted Neuro: A&O x 3, no focal deficits  Data Reviewed: I have personally reviewed following labs and imaging studies.  CBC: Recent Labs  Lab 05/26/19 0844 05/26/19 1735 05/27/19 0402 05/28/19 1207 05/29/19 0420 05/30/19 0337  WBC 4.2  --  2.8* 5.7 3.6* 3.4*  NEUTROABS 3.5  --  2.4 5.3 2.6 2.4  HGB 6.4* 7.9* 6.9* 8.3* 8.0* 8.7*  HCT 20.3* 24.9* 22.1* 25.6* 24.9* 27.2*  MCV 98.1  --  94.4 91.4 92.9 92.8  PLT 155  --  138* 181 201 179   Basic Metabolic Panel: Recent Labs  Lab 05/26/19 0844 05/27/19 0402 05/29/19 0420 05/30/19 0337  NA 133* 133* 135 136  K 4.2 4.4 3.9 3.9  CL 106 104 102 103  CO2 20* 20* 23 23  GLUCOSE 117* 215* 101* 123*  BUN 53* 57* 74* 78*  CREATININE 1.94* 2.05* 2.15* 1.92*  CALCIUM 8.3* 8.3* 8.4* 8.9  MG  --   --  2.0 2.1  PHOS  --   --   --  3.4   GFR: Estimated Creatinine Clearance: 18.5 mL/min (A) (by C-G formula based on SCr of 1.92 mg/dL (H)). Liver Function Tests: Recent Labs  Lab 05/26/19 0844 05/27/19 0402 05/29/19 0420 05/30/19 0337  AST _0 --   ALT _1 --   ALKPHOS 53 49 47  --   BILITOT 0.8 0.7 1.1  --   PROT 6.0* 5.8* 6.0*  --   ALBUMIN 2.6* 2.4* 2.8* 2.8*   No results for input(s): LIPASE, AMYLASE in the last 168 hours. No results for input(s): AMMONIA in the last 168 hours. Coagulation Profile: Recent Labs  Lab 05/26/19 1735 05/27/19 0402 05/28/19 0404 05/29/19 0420 05/30/19 0337  INR 3.7* 4.5* 3.9* 2.3* 1.3*   Cardiac Enzymes: No results for input(s): CKTOTAL, CKMB, CKMBINDEX, TROPONINI in the last 168 hours. BNP (last 3 results) No  results for input(s): PROBNP in the last 8760 hours. HbA1C: No results for input(s): HGBA1C in the last 72 hours. CBG: Recent Labs  Lab 05/29/19 0804 05/29/19 1149 05/29/19 1627 05/29/19 2213 05/30/19 0724  GLUCAP 88 208* 308* 205* 116*   Lipid Profile: No results for input(s): CHOL, HDL, LDLCALC, TRIG, CHOLHDL, LDLDIRECT in the last 72 hours. Thyroid Function Tests: No results for input(s): TSH, T4TOTAL, FREET4, T3FREE, THYROIDAB in the last 72 hours. Anemia Panel: No results for input(s): VITAMINB12, FOLATE, FERRITIN, TIBC, IRON, RETICCTPCT in the last 72 hours. Sepsis Labs: Recent Labs  Lab 05/26/19 1735 05/27/19 0402 05/28/19 0404  PROCALCITON <0.10 0.14 0.12    Recent Results (from the past 240 hour(s))  SARS Coronavirus 2 [facility] order, Performed in [facility] hospital lab) Nasopharyngeal Nasopharyngeal Swab     Status: None   Collection Time: 05/26/19  8:44 AM   Specimen: Nasopharyngeal Swab  Result Value Ref Range Status   SARS Coronavirus 2 NEGATIVE NEGATIVE Final    Comment: (NOTE) If result is NEGATIVE SARS-CoV-2 target nucleic acids are NOT DETECTED. The SARS-CoV-2 RNA is generally detectable in upper and lower  respiratory specimens during the acute phase of infection. The lowest  concentration of SARS-CoV-2 viral copies this assay can detect is 250  copies / mL. A negative result does not preclude SARS-CoV-2 infection  and should not be used as the sole basis for treatment or other  patient management decisions.  A negative result may occur with  improper specimen collection / handling, submission of specimen other  than nasopharyngeal swab, presence of viral mutation(s) within the  areas targeted by this assay, and inadequate number of viral copies  (<250 copies / mL). A negative result must be combined with clinical  observations, patient history, and epidemiological information. If result is POSITIVE SARS-CoV-2 target nucleic acids are DETECTED.  The SARS-CoV-2 RNA is generally detectable in upper and lower  respiratory specimens dur ing the acute phase of infection.  Positive  results are indicative of active infection with SARS-CoV-2.  Clinical  correlation with patient history and other diagnostic information is  necessary to determine patient infection status.  Positive results do  not rule out bacterial infection or co-infection with other viruses. If result is PRESUMPTIVE POSTIVE SARS-CoV-2 nucleic acids MAY BE PRESENT.   A presumptive positive result was obtained on the submitted specimen  and confirmed on repeat testing.  While 2019 novel coronavirus  (SARS-CoV-2) nucleic acids may be present in the submitted sample  additional confirmatory testing may be necessary for epidemiological  and / or clinical management purposes  to differentiate between  SARS-CoV-2 and other Sarbecovirus currently known to infect humans.  If clinically indicated additional testing with an alternate test  methodology [phone]) is advised. The SARS-CoV-2 RNA is generally  detectable in upper and lower respiratory sp ecimens during the acute  phase of infection. The expected result is Negative. Fact Sheet for Patients:  StrictlyIdeas.no Fact Sheet for Healthcare Providers: BankingDealers.co.za This test is not yet approved or cleared by the [place] FDA and has been authorized for detection and/or diagnosis of SARS-CoV-2 by FDA under an Emergency Use Authorization (EUA).  This EUA will remain in effect (meaning this test can be used) for the duration of the COVID-19 declaration under Section 564(b)(1) of the Act, 21 U.S.C. section 360bbb-3(b)(1), unless the authorization is terminated or revoked sooner. Performed at [facility], [place] [address], [place], [place] [address]   Culture, Urine     Status: Abnormal   Collection Time: 05/26/19  8:10 PM   Specimen: Urine,  Random  Result Value Ref Range Status   Specimen Description   Final    URINE, RANDOM Performed at [place] [address], [place], [place] [address]    Special Requests   Final    NONE Performed at [facility], [place] [address] [place], [place] [address]    Culture >=100,000 COLONIES/mL KLEBSIELLA PNEUMONIAE (A)  Final   Report Status 05/30/2019 FINAL  Final   Organism ID, Bacteria KLEBSIELLA PNEUMONIAE (A)  Final      Susceptibility   Klebsiella pneumoniae - MIC*    AMPICILLIN RESISTANT Resistant     CEFAZOLIN <=4 SENSITIVE Sensitive     CEFTRIAXONE <=1 SENSITIVE Sensitive     CIPROFLOXACIN <=0.25 SENSITIVE Sensitive     GENTAMICIN <=1 SENSITIVE Sensitive     IMIPENEM <=0.25 SENSITIVE Sensitive     NITROFURANTOIN <=16 SENSITIVE Sensitive     TRIMETH/SULFA <=20 SENSITIVE Sensitive     AMPICILLIN/SULBACTAM 4 SENSITIVE Sensitive     PIP/TAZO <=4 SENSITIVE Sensitive     Extended ESBL NEGATIVE Sensitive     * >=100,000 COLONIES/mL KLEBSIELLA PNEUMONIAE      Radiology Studies: No results found.   Scheduled Meds: . sodium chloride   Intravenous Once  . allopurinol  100 mg Oral Daily  . ALPRAZolam  0.25 mg Oral QHS  . atorvastatin  80 mg Oral q1800  . cholecalciferol  1,000 Units Oral Q lunch  . doxycycline  100 mg Oral Q12H  . feeding supplement (PRO-STAT SUGAR FREE 64)  30 mL Oral BID  . furosemide  60 mg Oral BID  . hydrALAZINE  100 mg Oral TID  . insulin aspart  0-5 Units Subcutaneous QHS  . insulin aspart  0-9 Units Subcutaneous TID WC  . magnesium oxide  400 mg Oral Daily  . mouth rinse  15 mL Mouth Rinse BID  . metoprolol tartrate  12.5 mg Oral BID  . pantoprazole  40 mg Oral BID  . polyethylene glycol  17 g Oral Daily  . predniSONE  40 mg Oral Q breakfast  . spironolactone  25 mg Oral Daily  . tadalafil  40 mg Oral Daily  . vitamin B-12  1,000 mcg Oral Q lunch  . vitamin C  500 mg Oral Q lunch  . Warfarin -  Pharmacist Dosing Inpatient   Does not apply q1800   Continuous Infusions:   LOS: 3 days    Time spent: 25 minutes spent in the coordination of care today.  Jonnie Finner, DO [facility] Pager [phone]  If 7PM-7AM, please contact night-coverage [email or web address] Password [facility] 05/30/2019, 10:58 AM

## 2019-05-30 NOTE — Progress Notes (Signed)
Inpatient Diabetes Program Recommendations  AACE/ADA: New Consensus Statement on Inpatient Glycemic Control (2015)  Target Ranges:  Prepandial:   less than 140 mg/dL      Peak postprandial:   less than 180 mg/dL (1-2 hours)      Critically ill patients:  140 - 180 mg/dL   Lab Results  Component Value Date   GLUCAP 293 (H) 05/30/2019   HGBA1C 4.6 (L) 02/24/2019    Review of Glycemic Control  Post-prandials elevated. May benefit from meal coverage insulin while on steroids.  Inpatient Diabetes Program Recommendations:     Add Novolog 3 units tidwc for meal coverage insulin if pt eats > 50% meal.  Will continue to follow.  Thank you. Lorenda Peck, RD, LDN, CDE Inpatient Diabetes Coordinator [phone]

## 2019-05-30 NOTE — Progress Notes (Signed)
HEMATOLOGY-ONCOLOGY PROGRESS NOTE  SUBJECTIVE: [name] states that she did not sleep well last night.  Thinks her breathing has improved.  She does report some intermittent dizziness.  She has not noticed any bleeding this morning.  States that she would like to be seen by the dietitian.  REVIEW OF SYSTEMS:   A comprehensive 14 point review of systems was negative except as noted in the HPI.  I have reviewed the past medical history, past surgical history, social history and family history with the patient and they are unchanged from previous note.   PHYSICAL EXAMINATION: ECOG PERFORMANCE STATUS: 1 - Symptomatic but completely ambulatory  Vitals:   05/30/19 0407 05/30/19 1421  BP: (!) 165/64 (!) 157/55  Pulse: 61 (!) 53  Resp: 20   Temp: 97.7 F (36.5 C) 98 F (36.7 C)  SpO2: 94% 96%   Filed Weights   05/28/19 0417 05/29/19 0603 05/30/19 0407  Weight: 110 lb 14.3 oz (50.3 kg) 110 lb 0.2 oz (49.9 kg) 110 lb 7.2 oz (50.1 kg)    Intake/Output from previous day: 08/20 0701 - 08/21 0700 In: 840 [P.O.:840] Out: 2670 [Urine:2670]  GENERAL:alert, no distress and comfortable LUNGS: Decreased in the bases with normal breathing effort HEART: regular rate & rhythm with audible click, trace bilateral lower extremity edema ABDOMEN:abdomen soft, non-tender and normal bowel sounds Musculoskeletal:no cyanosis of digits and no clubbing  NEURO: alert & oriented x 3 with fluent speech, no focal motor/sensory deficits  LABORATORY DATA:  I have reviewed the data as listed CMP Latest Ref Rng & Units 05/30/2019 05/29/2019 05/27/2019  Glucose 70 - 99 mg/dL 123(H) 101(H) 215(H)  BUN 8 - 23 mg/dL 78(H) 74(H) 57(H)  Creatinine 0.44 - 1.00 mg/dL 1.92(H) 2.15(H) 2.05(H)  Sodium 135 - 145 mmol/L 136 135 133(L)  Potassium 3.5 - 5.1 mmol/L 3.9 3.9 4.4  Chloride 98 - 111 mmol/L 103 102 104  CO2 22 - 32 mmol/L 23 23 20(L)  Calcium 8.9 - 10.3 mg/dL 8.9 8.4(L) 8.3(L)  Total Protein 6.5 - 8.1 g/dL - 6.0(L)  5.8(L)  Total Bilirubin 0.3 - 1.2 mg/dL - 1.1 0.7  Alkaline Phos 38 - 126 U/L - 47 49  AST 15 - 41 U/L - 21 17  ALT 0 - 44 U/L - 15 12    Lab Results  Component Value Date   WBC 3.4 (L) 05/30/2019   HGB 8.7 (L) 05/30/2019   HCT 27.2 (L) 05/30/2019   MCV 92.8 05/30/2019   PLT 206 05/30/2019   NEUTROABS 2.4 05/30/2019    Dg Chest Port 1 View  Result Date: 05/28/2019 CLINICAL DATA:  Pulmonary edema EXAM: PORTABLE CHEST 1 VIEW COMPARISON:  05/26/2019 FINDINGS: Diffuse bilateral interstitial and alveolar airspace opacities similar in appearance to the prior exam. Calcified right upper lobe pulmonary nodule and left lower lobe pulmonary nodule consistent with sequela prior granulomatous disease. Trace bilateral pleural effusions. Hyperinflated bilateral lungs as can be seen with COPD. No pneumothorax. Stable cardiomegaly. Prior CABG. No acute osseous abnormality. IMPRESSION: Findings most concerning for persistent pulmonary edema. Electronically Signed   By: Kathreen Devoid   On: 05/28/2019 06:55   Dg Chest Portable 1 View  Result Date: 05/26/2019 CLINICAL DATA:  Weakness and shortness of breath since last night. EXAM: PORTABLE CHEST 1 VIEW COMPARISON:  05/08/2019. FINDINGS: Stable enlarged cardiac silhouette and post CABG changes. Increased diffuse prominence of the interstitial markings, especially on the right, with probable mild interspersed airspace opacities. Kerley lines are noted at the right  lung base. No pleural fluid is seen. Bilateral calcified granulomata are again noted. Also noted are coarse left breast calcifications compatible with degenerated fibroadenomata. Diffuse osteopenia. Mild to moderate left and minimal right glenohumeral joint degenerative changes. IMPRESSION: 1. Worsening pulmonary edema or pneumonia/pneumonitis. 2. Stable cardiomegaly. Electronically Signed   By: Claudie Revering M.D.   On: 05/26/2019 09:01   Dg Chest Portable 1 View  Result Date: 05/08/2019 CLINICAL DATA:   Fever EXAM: PORTABLE CHEST 1 VIEW COMPARISON:  04/04/2019, 02/28/2019 FINDINGS: Post sternotomy changes. Cardiomegaly with vascular congestion. Diffuse bilateral interstitial and ground-glass opacity, possible edema. More confluent opacity at the right base may reflect superimposed pneumonia. No pneumothorax. IMPRESSION: 1. Cardiomegaly with vascular congestion and diffuse bilateral interstitial and ground-glass opacity suggesting pulmonary edema. 2. More confluent airspace disease at the right base, could reflect superimposed pneumonia Electronically Signed   By: Donavan Foil M.D.   On: 05/08/2019 02:25    ASSESSMENT: 78 y.o. [place] woman with multifactorial anemia, as follows: (a) anemia of chronic inflammation: As of May 2020 she has a SED rate of 95, CRP 1.1, positive ANA and RF; she has been referred to rheumatology. Inflammation makes iron stores relatively inaccessible for hematopoiesis and blunts the effect of native EPO (b) hemolysis (mild): she has a mildly elevated LDH and a DAT positive for complement, not IgG; this is c/w (a) above (c) anemia of renal insufficiency: inadequate EPO resonse to anemia                     (d) GIB/ iron deficiency in patient on lifelong anticoagulaton  (1) Feraheme received 03/01/2019, 03/20/2019, and 05/12/2019             (a) reticulocyte 03/07/2019 up from 43.4 a year ago to 191.8 after iron infusion  (b) reticulocyte 05/19/2019 208.5  (2) darbepoetin: starting 05/05/2019 at 195mg given every 2 weeks.    (3) prednisone 10 mg daily started on 05/19/2019               PLAN: [name]'s hemoglobin has improved to 8.3.  Her white blood cell count and platelet count have now normalized.  She does not require transfusion today.  Recommend aggressive rheumatologic intervention which will likely need to be done as an outpatient since rheumatology does not come to the hospital.  The only other suggestion from a  hematology standpoint is to consider a bone marrow biopsy to make sure that we are not overlooking something.  Dietitian consult has already been placed by the hospitalist.   LOS: 3 days   GChauncey Cruel DNP, AGPCNP-BC, AOCNP 05/30/19   ADDENDUM: I discussed the overall situation w [name]. Unfortunately I have been unable to reach Dr HPosey Rea I this at this point we need to treat her vasculitis and I am starting her on prednisone 60 [CORRECTION: WILL CONTINUE AT 40 MG PO W BREAKFAST] mg daily w breakfast and rituxan weekly starting 8/24. We discussed the possible toxicities, side effects and complications of these treatments. She is agreeable to proceeding.  I anticipate significant improvement in her counts and functonal status within 4 weeks.  Will sign off at this time.Please let me know if I can be of further help.  GM

## 2019-05-30 NOTE — Evaluation (Signed)
Physical Therapy One Evaluation Patient Details Name: [name] MRN: [id number] DOB: Feb 16, 1941 Today's Date: 05/30/2019   History of Present Illness  78 y.o. female with medical history significant of mechanical aortic valve on warfarin, pulmonary hypertension, CAD, diabetes, HTN, HLD and multiple other medical problems and admitted with dyspnea secondary to pulmonary edema versus anemia versus pneumonitis as seen on chest x-ray  Clinical Impression  Patient evaluated by Physical Therapy with no further acute PT needs identified. All education has been completed and the patient has no further questions. Pt mobility is at baseline and pt agreeable to ambulate with nursing staff during acute stay.  Pt ambulated in hallway for 7 minutes and monitored SPO2 on room air 90-95%.  Pt denies any dyspnea.  See below for any follow-up Physical Therapy or equipment needs. PT is signing off. Thank you for this referral.     Follow Up Recommendations No PT follow up    Equipment Recommendations  None recommended by PT    Recommendations for Other Services       Precautions / Restrictions Precautions Precautions: Fall Restrictions Weight Bearing Restrictions: No      Mobility  Bed Mobility Overal bed mobility: Modified Independent             General bed mobility comments: sitting EOB  Transfers Overall transfer level: Needs assistance Equipment used: Rolling walker (2 wheeled) Transfers: Sit to/from Stand Sit to Stand: Supervision            Ambulation/Gait Ambulation/Gait assistance: Supervision Gait Distance (Feet): 400 Feet Assistive device: Rolling walker (2 wheeled) Gait Pattern/deviations: WFL(Within Functional Limits)     General Gait Details: slow pace however steady with RW, monitored SPO2 on room air 90-95%, pt ambulated for 7 minutes  Stairs            Wheelchair Mobility    Modified Rankin (Stroke Patients Only)       Balance Overall  balance assessment: Needs assistance(pt denies any recent falls)         Standing balance support: No upper extremity supported Standing balance-Leahy Scale: Fair Standing balance comment: can static stand with no UE support. support needed for dynamic standing and gait                             Pertinent Vitals/Pain Pain Assessment: No/denies pain    Home Living Family/patient expects to be discharged to:: Private residence Living Arrangements: Alone Available Help at Discharge: Family;Friend(s);Available PRN/intermittently Type of Home: House Home Access: Stairs to enter Entrance Stairs-Rails: Right Entrance Stairs-Number of Steps: 3 Home Layout: One level Home Equipment: Walker - 2 wheels;Grab bars - tub/shower      Prior Function Level of Independence: Independent               Hand Dominance   Dominant Hand: Right    Extremity/Trunk Assessment   Upper Extremity Assessment Upper Extremity Assessment: Generalized weakness;Overall [facility] for tasks assessed    Lower Extremity Assessment Lower Extremity Assessment: Overall WFL for tasks assessed       Communication   Communication: No difficulties  Cognition Arousal/Alertness: Awake/alert Behavior During Therapy: WFL for tasks assessed/performed Overall Cognitive Status: Within Functional Limits for tasks assessed  General Comments      Exercises     Assessment/Plan    PT Assessment Patent does not need any further PT services  PT Problem List         PT Treatment Interventions      PT Goals (Current goals can be found in the Care Plan section)  Acute Rehab PT Goals Patient Stated Goal: age in place as long as she can PT Goal Formulation: All assessment and education complete, DC therapy    Frequency     Barriers to discharge        Co-evaluation               AM-PAC PT "6 Clicks" Mobility  Outcome Measure Help  needed turning from your back to your side while in a flat bed without using bedrails?: None Help needed moving from lying on your back to sitting on the side of a flat bed without using bedrails?: None Help needed moving to and from a bed to a chair (including a wheelchair)?: None Help needed standing up from a chair using your arms (e.g., wheelchair or bedside chair)?: A Little Help needed to walk in hospital room?: A Little Help needed climbing 3-5 steps with a railing? : A Little 6 Click Score: 21    End of Session Equipment Utilized During Treatment: Gait belt Activity Tolerance: Patient tolerated treatment well Patient left: with call bell/phone within reach;in bed(with OT)   PT Visit Diagnosis: Difficulty in walking, not elsewhere classified (R26.2)    Time: 1135-1150 PT Time Calculation (min) (ACUTE ONLY): 15 min   Charges:   PT Evaluation $PT Eval Low Complexity: [place], PT, DPT Acute Rehabilitation Services Office: [phone] Pager: [phone]   Trena Platt 05/30/2019, 1:19 PM

## 2019-05-30 NOTE — Care Management Important Message (Signed)
Important Message  Patient Details IM Letter given to Rhea Pink SW to present to the Patient Name: [name] MRN: [id number] Date of Birth: May 29, 1941   Medicare Important Message Given:  Yes     Kerin Salen 05/30/2019, 11:07 AM

## 2019-05-30 NOTE — Progress Notes (Signed)
HEMATOLOGY-ONCOLOGY PROGRESS NOTE  SUBJECTIVE: [name] states that she did not sleep well last night.  Thinks her breathing has improved.  She does report some intermittent dizziness.  She has not noticed any bleeding this morning.  States that she would like to be seen by the dietitian.  REVIEW OF SYSTEMS:   A comprehensive 14 point review of systems was negative except as noted in the HPI.  I have reviewed the past medical history, past surgical history, social history and family history with the patient and they are unchanged from previous note.   PHYSICAL EXAMINATION: ECOG PERFORMANCE STATUS: 1 - Symptomatic but completely ambulatory  Vitals:   05/29/19 2212 05/30/19 0407  BP: (!) 167/51 (!) 165/64  Pulse: 64 61  Resp: 15 20  Temp: 98.4 F (36.9 C) 97.7 F (36.5 C)  SpO2: 96% 94%   Filed Weights   05/28/19 0417 05/29/19 0603 05/30/19 0407  Weight: 110 lb 14.3 oz (50.3 kg) 110 lb 0.2 oz (49.9 kg) 110 lb 7.2 oz (50.1 kg)    Intake/Output from previous day: 08/20 0701 - 08/21 0700 In: 840 [P.O.:840] Out: 2670 [Urine:2670]  GENERAL:alert, no distress and comfortable LUNGS: Decreased in the bases with normal breathing effort HEART: regular rate & rhythm with audible click, trace bilateral lower extremity edema ABDOMEN:abdomen soft, non-tender and normal bowel sounds Musculoskeletal:no cyanosis of digits and no clubbing  NEURO: alert & oriented x 3 with fluent speech, no focal motor/sensory deficits  LABORATORY DATA:  I have reviewed the data as listed CMP Latest Ref Rng & Units 05/30/2019 05/29/2019 05/27/2019  Glucose 70 - 99 mg/dL 123(H) 101(H) 215(H)  BUN 8 - 23 mg/dL 78(H) 74(H) 57(H)  Creatinine 0.44 - 1.00 mg/dL 1.92(H) 2.15(H) 2.05(H)  Sodium 135 - 145 mmol/L 136 135 133(L)  Potassium 3.5 - 5.1 mmol/L 3.9 3.9 4.4  Chloride 98 - 111 mmol/L 103 102 104  CO2 22 - 32 mmol/L 23 23 20(L)  Calcium 8.9 - 10.3 mg/dL 8.9 8.4(L) 8.3(L)  Total Protein 6.5 - 8.1 g/dL - 6.0(L)  5.8(L)  Total Bilirubin 0.3 - 1.2 mg/dL - 1.1 0.7  Alkaline Phos 38 - 126 U/L - 47 49  AST 15 - 41 U/L - 21 17  ALT 0 - 44 U/L - 15 12    Lab Results  Component Value Date   WBC 3.4 (L) 05/30/2019   HGB 8.7 (L) 05/30/2019   HCT 27.2 (L) 05/30/2019   MCV 92.8 05/30/2019   PLT 206 05/30/2019   NEUTROABS 2.4 05/30/2019    Dg Chest Port 1 View  Result Date: 05/28/2019 CLINICAL DATA:  Pulmonary edema EXAM: PORTABLE CHEST 1 VIEW COMPARISON:  05/26/2019 FINDINGS: Diffuse bilateral interstitial and alveolar airspace opacities similar in appearance to the prior exam. Calcified right upper lobe pulmonary nodule and left lower lobe pulmonary nodule consistent with sequela prior granulomatous disease. Trace bilateral pleural effusions. Hyperinflated bilateral lungs as can be seen with COPD. No pneumothorax. Stable cardiomegaly. Prior CABG. No acute osseous abnormality. IMPRESSION: Findings most concerning for persistent pulmonary edema. Electronically Signed   By: Kathreen Devoid   On: 05/28/2019 06:55   Dg Chest Portable 1 View  Result Date: 05/26/2019 CLINICAL DATA:  Weakness and shortness of breath since last night. EXAM: PORTABLE CHEST 1 VIEW COMPARISON:  05/08/2019. FINDINGS: Stable enlarged cardiac silhouette and post CABG changes. Increased diffuse prominence of the interstitial markings, especially on the right, with probable mild interspersed airspace opacities. Kerley lines are noted at the right lung  base. No pleural fluid is seen. Bilateral calcified granulomata are again noted. Also noted are coarse left breast calcifications compatible with degenerated fibroadenomata. Diffuse osteopenia. Mild to moderate left and minimal right glenohumeral joint degenerative changes. IMPRESSION: 1. Worsening pulmonary edema or pneumonia/pneumonitis. 2. Stable cardiomegaly. Electronically Signed   By: Claudie Revering M.D.   On: 05/26/2019 09:01   Dg Chest Portable 1 View  Result Date: 05/08/2019 CLINICAL DATA:   Fever EXAM: PORTABLE CHEST 1 VIEW COMPARISON:  04/04/2019, 02/28/2019 FINDINGS: Post sternotomy changes. Cardiomegaly with vascular congestion. Diffuse bilateral interstitial and ground-glass opacity, possible edema. More confluent opacity at the right base may reflect superimposed pneumonia. No pneumothorax. IMPRESSION: 1. Cardiomegaly with vascular congestion and diffuse bilateral interstitial and ground-glass opacity suggesting pulmonary edema. 2. More confluent airspace disease at the right base, could reflect superimposed pneumonia Electronically Signed   By: Donavan Foil M.D.   On: 05/08/2019 02:25    ASSESSMENT: 78 y.o. [place] woman with multifactorial anemia, as follows: (a) anemia of chronic inflammation: As of May 2020 she has a SED rate of 95, CRP 1.1, positive ANA and RF; she has been referred to rheumatology. Inflammation makes iron stores relatively inaccessible for hematopoiesis and blunts the effect of native EPO (b) hemolysis (mild): she has a mildly elevated LDH and a DAT positive for complement, not IgG; this is c/w (a) above (c) anemia of renal insufficiency: inadequate EPO resonse to anemia                     (d) GIB/ iron deficiency in patient on lifelong anticoagulaton  (1) Feraheme received 03/01/2019, 03/20/2019, and 05/12/2019             (a) reticulocyte 03/07/2019 up from 43.4 a year ago to 191.8 after iron infusion  (b) reticulocyte 05/19/2019 208.5  (2) darbepoetin: starting 05/05/2019 at 183mg given every 2 weeks.    (3) prednisone 10 mg daily started on 05/19/2019               PLAN: Sydnee's hemoglobin has improved to 8.3.  Her white blood cell count and platelet count have now normalized.  She does not require transfusion today.  Recommend aggressive rheumatologic intervention which will likely need to be done as an outpatient since rheumatology does not come to the hospital.  The only other suggestion from a  hematology standpoint is to consider a bone marrow biopsy to make sure that we are not overlooking something.  Dietitian consult has already been placed by the hospitalist.   LOS: 3 days   GChauncey Cruel DNP, AGPCNP-BC, AOCNP 05/30/19   ADDENDUM: I discussed the overall situation w Ms Aslin. Unfortunately I have been unable to reach Dr HPosey Rea I this at this point we need to treat her vasculitis and I am starting her on prednisone 60 mg daily w breakfast and rituxan weekly starting 8/24. We discussed the possible toxicities, side effects and complications of these treatments. She is agreeable to proceeding.  I anticipate significant improvement in her counts and functonal status within 4 weeks.  Will sign off at this time.Please let me know if I can be of further help.  GM

## 2019-05-31 DIAGNOSIS — R0602 Shortness of breath: Secondary | ICD-10-CM

## 2019-05-31 DIAGNOSIS — I776 Arteritis, unspecified: Secondary | ICD-10-CM

## 2019-05-31 LAB — CBC WITH DIFFERENTIAL/PLATELET
Abs Immature Granulocytes: 0.09 10*3/uL — ABNORMAL HIGH (ref 0.00–0.07)
Basophils Absolute: 0.1 10*3/uL (ref 0.0–0.1)
Basophils Relative: 1 %
Eosinophils Absolute: 0.2 10*3/uL (ref 0.0–0.5)
Eosinophils Relative: 4 %
HCT: 28.2 % — ABNORMAL LOW (ref 36.0–46.0)
Hemoglobin: 8.8 g/dL — ABNORMAL LOW (ref 12.0–15.0)
Immature Granulocytes: 2 %
Lymphocytes Relative: 16 %
Lymphs Abs: 0.8 10*3/uL (ref 0.7–4.0)
MCH: 29.5 pg (ref 26.0–34.0)
MCHC: 31.2 g/dL (ref 30.0–36.0)
MCV: 94.6 fL (ref 80.0–100.0)
Monocytes Absolute: 0.4 10*3/uL (ref 0.1–1.0)
Monocytes Relative: 8 %
Neutro Abs: 3.4 10*3/uL (ref 1.7–7.7)
Neutrophils Relative %: 69 %
Platelets: 216 10*3/uL (ref 150–400)
RBC: 2.98 MIL/uL — ABNORMAL LOW (ref 3.87–5.11)
RDW: 21 % — ABNORMAL HIGH (ref 11.5–15.5)
WBC: 4.9 10*3/uL (ref 4.0–10.5)
nRBC: 0 % (ref 0.0–0.2)

## 2019-05-31 LAB — PROTIME-INR
INR: 1.3 — ABNORMAL HIGH (ref 0.8–1.2)
Prothrombin Time: 15.6 seconds — ABNORMAL HIGH (ref 11.4–15.2)

## 2019-05-31 LAB — RENAL FUNCTION PANEL
Albumin: 2.8 g/dL — ABNORMAL LOW (ref 3.5–5.0)
Anion gap: 9 (ref 5–15)
BUN: 85 mg/dL — ABNORMAL HIGH (ref 8–23)
CO2: 23 mmol/L (ref 22–32)
Calcium: 8.7 mg/dL — ABNORMAL LOW (ref 8.9–10.3)
Chloride: 102 mmol/L (ref 98–111)
Creatinine, Ser: 2.02 mg/dL — ABNORMAL HIGH (ref 0.44–1.00)
GFR calc Af Amer: 26 mL/min — ABNORMAL LOW (ref >60)
GFR calc non Af Amer: 23 mL/min — ABNORMAL LOW (ref >60)
Glucose, Bld: 111 mg/dL — ABNORMAL HIGH (ref 70–99)
Phosphorus: 3.9 mg/dL (ref 2.5–4.6)
Potassium: 3.9 mmol/L (ref 3.5–5.1)
Sodium: 134 mmol/L — ABNORMAL LOW (ref 135–145)

## 2019-05-31 LAB — GLUCOSE, CAPILLARY
Glucose-Capillary: 105 mg/dL — ABNORMAL HIGH (ref 70–99)
Glucose-Capillary: 223 mg/dL — ABNORMAL HIGH (ref 70–99)

## 2019-05-31 MED ORDER — DOXYCYCLINE HYCLATE 100 MG PO TABS: 100.0000 mg | ORAL_TABLET | Freq: Two times a day (BID) | ORAL | 0 refills | Status: AC

## 2019-05-31 MED ORDER — WARFARIN SODIUM 5 MG PO TABS: 7.5000 mg | ORAL_TABLET | Freq: Once | ORAL | Status: DC

## 2019-05-31 MED ORDER — CLONIDINE HCL 0.1 MG PO TABS
0.1000 mg | ORAL_TABLET | Freq: Two times a day (BID) | ORAL | Status: DC
  Administered 2019-05-31: 0.1 mg via ORAL
  Filled 2019-05-31: qty 1

## 2019-05-31 MED ORDER — PREDNISONE 20 MG PO TABS: 40.0000 mg | ORAL_TABLET | Freq: Every day | ORAL | 0 refills | Status: DC

## 2019-05-31 MED ORDER — INSULIN ASPART 100 UNIT/ML ~~LOC~~ SOLN
3.0000 [IU] | Freq: Three times a day (TID) | SUBCUTANEOUS | Status: DC
  Administered 2019-05-31 (×2): 3 [IU] via SUBCUTANEOUS

## 2019-05-31 MED ORDER — CLONIDINE HCL 0.1 MG PO TABS: 0.1000 mg | ORAL_TABLET | Freq: Two times a day (BID) | ORAL | 1 refills | Status: DC

## 2019-05-31 NOTE — Discharge Summary (Signed)
Physician Discharge Summary  [name] CHE:[id number] DOB: 09/13/1941 DOA: 05/26/2019  PCP: [name], MD  Admit date: 05/26/2019 Discharge date: 05/31/2019  Admitted From: Home Disposition:  Discharged to home.   Recommendations for Outpatient Follow-up:  1. Follow up with PCP in 1 week. 2. Follow up with Heme/Onc on 06/02/19 3. Follow up with Coumadin clinic on 06/02/2019 4. Follow up with Cardiology for doppler studies. 5. Follow up with Rheumatology in 1 week. 6. Follow up with Pulmonology as scheduled 7. Obtain BMP in 5 - 7 days.  Discharge Condition: Stable  CODE STATUS: FULL    Brief/Interim Summary: 78 y.o.femalewith medical history significant ofmechanical aortic valve on warfarin, pulmonary hypertension, CAD, diabetes, HTN, HLD and multiple other medical problems presenting after developing SOB overnight.She notes she was in her regular state of health on 8/16, but overnight she woke up short of breath. She denies CP, fever, chills. She does not orthopnea and PND. She notes she had some abdominal discomfort, nausea which has since resolved.Her stool is dark, she suspects due to iron.  She was recently discharged on 7/31 after developing fevers, chills, anda diffuserash(which she relates toAranespuse as she apparently had a flare again after her second shot. She received vanc/cefepime, doxycycline and was discharged with steroid taper.Work up in [place] revealed acute hypoxic respiratory failure requiring 3 L of nasal cannula, labs showed anemia (hemoglobin 7.6-->6.4) andchest x-ray showed pulmonary edema versus pneumonitis. Patient follows hematology Dr.Magrinatfor anemia/thrombocytopenia and receives Aranesp shots.  Discharge Diagnoses:  Active Problems:   Hypertension   Diabetes mellitus without complication (HCC)   Anemia   Pancytopenia (HCC)   Dyspnea   Vasculitis, ANCA positive (HCC)  Dyspnea/acute hypoxic respiratory failure:  -  Secondary to pulmonary edema versus anemia versus pneumonitis as seen on chest x-ray.  - Mildly elevated BNP (395).  - Recent echo from July showed preserved EF at 60 to 65%, pulmonary artery hypertension.  - Seen by PCCM who agreed with IV diuresis for now.  - Patient on tadalafil which has been resumed.  - Monitor I's and O's, daily weights and follow renal function closely.  - Monitor for fluid overload signs with blood transfusion - continue diuresis as renal function allows - PCCM has s/o'd. - need to switch to Po lasix soon - PT working with pt; 6MWT ordered.     - transition to PO lasix 71m BID     - steroids increased to 4110mprednisone     - decrease to lasix 6055mt discharge  Pancytopenia:  - Patient followed by hematology closely.  - Patient receiving 2 units of PRBC. - She had lab work drawn during her prior hospitalization which showed a normal C3 and C4 complement,normal double-stranded DNA antibody, normal cryoglobulin,elevated P-ANCA titer of 1:640, elevated myeloperoxidase antibody 20.6, and elevated ANCA proteinase 3 at 40.3. - Recommendations per hematology were to proceed with a bone marrow biopsy. - However, there is concern for possible vasculitis given her positive P-ANCA and positive MPO and PR-3. -The bone marrow biopsy was placed on hold by hematology who would like her to follow-up with rheumatology for further evaluation of this. If no improvement, bone marrow biopsy would be pursued.     - Hgb holding 8.7 this AM     - Onco to start ritux outpt on Monday; continue prednisone for now  Petechial rash/RMSF: - pt presented late July with fever and petechial rash.  - She was treated initially with doxycycline, but it does not  sound like this course was completed.  - She attributes the rash to the injections she's been getting for her Hb (sounds like aranesp).  - Per  Dr. [name] note, this was thought to be less likely; Pt also noted to have positive IgM for RMSF; Resumed on doxycycline and steroids on admission.  Positive p-ANCA:  - Given respiratory symptoms, possibility of eosinophilic granulomatosis with polyangiitis was suspected on admission.      - Pulmonary recommends deferring invasive work-up like biopsies of nasal mucosa/skin biopsy/renal biopsy as patient on anticoagulation for mechanical heart valve and clinical history equivocal/renal function relatively stable.  Mechanical Aortic Valve/Chronic Atrial Fibrillation: - INR goal 2.5-3.5.   - Supratherapeutic today with INR at 3.9.  - Hold Warfarin and further adjustments per pharmacy     - INR down to 1.3; pharm dosing, heparin today?     - spoke with pharmacy, no heparin right now; ok to d/c on current dosing and follow up with coumadin clinic  CKD IV: - creatinine appears to be close to baseline around 1.7-1.8. - Follow with diuresis. - Scr is stable at 2.15, monitor     - Scr down to 1.9 today     - Scr at about 2 today  Hypertension:  - continue metoprolol, spironolactone, lasix IV (now PO)  - Hold irbesartan at discharge     - have started clonidine 0.69m BID, responding well  CAD:  - continue lipitor, metoprolol.  - Not on aspirin  Anxiety:  - continue xanax  GERD, Hx GI Bleed:  - continue PPI BID  Gout:  - continue allopurinol  T2DM:  - hold metformin, start SSI     - resume metformin at discharge  Peripheral vascular disease:  - Patient states she underwent work-up for peripheral circulation/PTCA by Dr. BGwenlyn Foundand was scheduled to undergo arterial studies including Dopplers of lower extremities/carotid on Thursday; follow up outpt; will need to reschedule appointment.  Discharge Instructions   Allergies as of 05/31/2019      Reactions   Flagyl [metronidazole] Rash   ALL-OVER  BODY RASH   Coreg [carvedilol] Other (See Comments)   Terrible cramping in the feet and had a lot of bowel movements, but not diarrhea   Losartan Swelling   Patient doesn't recall site of swelling   Verapamil Hives   Zetia [ezetimibe] Other (See Comments)   Reaction not recalled   Zocor [simvastatin - High Dose] Other (See Comments)   Reaction not recalled      Medication List    STOP taking these medications   irbesartan 300 MG tablet Commonly known as: AVAPRO     TAKE these medications   acetaminophen 325 MG tablet Commonly known as: TYLENOL Take 325 mg by mouth every 6 (six) hours as needed for mild pain.   allopurinol 100 MG tablet Commonly known as: ZYLOPRIM Take 100 mg by mouth daily.   ALPRAZolam 0.5 MG tablet Commonly known as: XANAX Take 0.25 mg by mouth at bedtime.   atorvastatin 80 MG tablet Commonly known as: LIPITOR Take 1 tablet (80 mg total) by mouth daily at 6 PM.   cloNIDine 0.1 MG tablet Commonly known as: CATAPRES Take 1 tablet (0.1 mg total) by mouth 2 (two) times daily.   diphenhydrAMINE-zinc acetate cream Commonly known as: BENADRYL Apply topically 3 (three) times daily as needed for itching.   doxycycline 100 MG tablet Commonly known as: VIBRA-TABS Take 1 tablet (100 mg total) by mouth every 12 (twelve) hours for  5 doses.   feeding supplement (GLUCERNA SHAKE) Liqd Take 237 mLs by mouth 2 (two) times daily between meals.   furosemide 40 MG tablet Commonly known as: LASIX Take 1.5 tablets (60 mg total) by mouth daily.   hydrALAZINE 100 MG tablet Commonly known as: APRESOLINE TAKE 1 TABLET (100 MG TOTAL) BY MOUTH 3 (THREE) TIMES DAILY.   magnesium oxide 400 (241.3 Mg) MG tablet Commonly known as: MAG-OX Take 1 tablet (400 mg total) by mouth daily.   metFORMIN 500 MG tablet Commonly known as: GLUCOPHAGE Take 500 mg by mouth 2 (two) times daily with a meal.   metoprolol tartrate 25 MG tablet Commonly known as: LOPRESSOR Take 0.5  tablets (12.5 mg total) by mouth 2 (two) times daily.   ondansetron 4 MG disintegrating tablet Commonly known as: Zofran ODT Take 1 tablet (4 mg total) by mouth every 8 (eight) hours as needed for nausea or vomiting.   pantoprazole 40 MG tablet Commonly known as: PROTONIX Take 40 mg by mouth 2 (two) times daily.   predniSONE 20 MG tablet Commonly known as: DELTASONE Take 2 tablets (40 mg total) by mouth daily with breakfast. Start taking on: June 01, 2019 What changed:   medication strength  how much to take  Another medication with the same name was removed. Continue taking this medication, and follow the directions you see here.   spironolactone 25 MG tablet Commonly known as: ALDACTONE Take 25 mg by mouth daily.   tadalafil (PAH) 20 MG tablet Commonly known as: ADCIRCA Take 2 tablets (40 mg total) by mouth daily.   Vitamin B12 1000 MCG Tbcr Take 1,000 mcg by mouth daily with lunch.   vitamin C 500 MG tablet Commonly known as: ASCORBIC ACID Take 500 mg by mouth daily with lunch.   Vitamin D3 25 MCG (1000 UT) Caps Take 1,000 Units by mouth daily with lunch.   warfarin 5 MG tablet Commonly known as: COUMADIN Take as directed. If you are unsure how to take this medication, talk to your nurse or doctor. Original instructions: Take 5-7.5 mg by mouth daily. Take 1.5 tablets on Tues, Thursday, Saturday and Sunday. Take 1 tablet on Mondays, Wednesdays, and Fridays.      Follow-up Information    Icard, Leory Plowman L, DO Follow up on 06/18/2019.   Specialty: Pulmonary Disease Why: at 130pm Contact information: [place] [address] [phone]        Magrinat, [name], MD Follow up on 06/02/2019.   Specialty: Oncology Contact information: [place] [address] [phone]        [name], MD Follow up in 1 week(s).   Specialty: Internal Medicine Why: Obtain BMP to look at renal function; BP  check Contact information: [place] [address] [phone]        Gavin Pound, MD Follow up in 1 week(s).   Specialty: Rheumatology Contact information: [place] [address] [phone]        Lorretta Harp, MD .   Specialties: Cardiology, Radiology Contact information: [address] [place] [address] [place] [address] [phone]          Allergies  Allergen Reactions  . Flagyl [Metronidazole] Rash    ALL-OVER BODY RASH  . Coreg [Carvedilol] Other (See Comments)    Terrible cramping in the feet and had a lot of bowel movements, but not diarrhea  . Losartan Swelling  Patient doesn't recall site of swelling  . Verapamil Hives  . Zetia [Ezetimibe] Other (See Comments)    Reaction not recalled  . Zocor [Simvastatin - High Dose] Other (See Comments)    Reaction not recalled    Consultations:  PCCM  Heme/Onc   Procedures/Studies: Dg Chest Port 1 View  Result Date: 05/28/2019 CLINICAL DATA:  Pulmonary edema EXAM: PORTABLE CHEST 1 VIEW COMPARISON:  05/26/2019 FINDINGS: Diffuse bilateral interstitial and alveolar airspace opacities similar in appearance to the prior exam. Calcified right upper lobe pulmonary nodule and left lower lobe pulmonary nodule consistent with sequela prior granulomatous disease. Trace bilateral pleural effusions. Hyperinflated bilateral lungs as can be seen with COPD. No pneumothorax. Stable cardiomegaly. Prior CABG. No acute osseous abnormality. IMPRESSION: Findings most concerning for persistent pulmonary edema. Electronically Signed   By: Kathreen Devoid   On: 05/28/2019 06:55   Dg Chest Portable 1 View  Result Date: 05/26/2019 CLINICAL DATA:  Weakness and shortness of breath since last night. EXAM: PORTABLE CHEST 1 VIEW COMPARISON:  05/08/2019. FINDINGS: Stable enlarged cardiac silhouette and post CABG changes. Increased diffuse prominence of the interstitial markings,  especially on the right, with probable mild interspersed airspace opacities. Kerley lines are noted at the right lung base. No pleural fluid is seen. Bilateral calcified granulomata are again noted. Also noted are coarse left breast calcifications compatible with degenerated fibroadenomata. Diffuse osteopenia. Mild to moderate left and minimal right glenohumeral joint degenerative changes. IMPRESSION: 1. Worsening pulmonary edema or pneumonia/pneumonitis. 2. Stable cardiomegaly. Electronically Signed   By: Claudie Revering M.D.   On: 05/26/2019 09:01   Dg Chest Portable 1 View  Result Date: 05/08/2019 CLINICAL DATA:  Fever EXAM: PORTABLE CHEST 1 VIEW COMPARISON:  04/04/2019, 02/28/2019 FINDINGS: Post sternotomy changes. Cardiomegaly with vascular congestion. Diffuse bilateral interstitial and ground-glass opacity, possible edema. More confluent opacity at the right base may reflect superimposed pneumonia. No pneumothorax. IMPRESSION: 1. Cardiomegaly with vascular congestion and diffuse bilateral interstitial and ground-glass opacity suggesting pulmonary edema. 2. More confluent airspace disease at the right base, could reflect superimposed pneumonia Electronically Signed   By: Donavan Foil M.D.   On: 05/08/2019 02:25      Subjective: "does it have to be that high for very long?"  Discharge Exam: Vitals:   05/31/19 0553 05/31/19 1000  BP: (!) 161/64   Pulse: (!) 57 68  Resp: 16   Temp: 98.2 F (36.8 C)   SpO2: 91%    Vitals:   05/30/19 2113 05/31/19 0418 05/31/19 0553 05/31/19 1000  BP: (!) 157/69  (!) 161/64   Pulse: 72  (!) 57 68  Resp: 14  16   Temp: 98.1 F (36.7 C)  98.2 F (36.8 C)   TempSrc: Oral  Oral   SpO2: 94%  91%   Weight:  49.3 kg    Height:        General:78 y.o.femaleresting in bed in NAD Eyes: PERRL, normal sclera ENMT: Nares patent w/o discharge, orophaynx clear, dentition normal, ears w/o discharge/lesions/ulcers Cardiovascular: RRR, +S1, S2, no m/g/r, click  noted (mechanical valve) equal pulses throughout Respiratory: CTABL, no w/r/r, normal WOB GI: BS+, NDNT, no masses noted, no organomegaly noted MSK: No e/c/c Skin: No rashes, bruises, ulcerations noted Neuro: A&O x 3, no focal deficits    The results of significant diagnostics from this hospitalization (including imaging, microbiology, ancillary and laboratory) are listed below for reference.     Microbiology: Recent Results (from the past 240 hour(s))  SARS Coronavirus 2 (  Hospital order, Performed in [facility] hospital lab) Nasopharyngeal Nasopharyngeal Swab     Status: None   Collection Time: 05/26/19  8:44 AM   Specimen: Nasopharyngeal Swab  Result Value Ref Range Status   SARS Coronavirus 2 NEGATIVE NEGATIVE Final    Comment: (NOTE) If result is NEGATIVE SARS-CoV-2 target nucleic acids are NOT DETECTED. The SARS-CoV-2 RNA is generally detectable in upper and lower  respiratory specimens during the acute phase of infection. The lowest  concentration of SARS-CoV-2 viral copies this assay can detect is 250  copies / mL. A negative result does not preclude SARS-CoV-2 infection  and should not be used as the sole basis for treatment or other  patient management decisions.  A negative result may occur with  improper specimen collection / handling, submission of specimen other  than nasopharyngeal swab, presence of viral mutation(s) within the  areas targeted by this assay, and inadequate number of viral copies  (<250 copies / mL). A negative result must be combined with clinical  observations, patient history, and epidemiological information. If result is POSITIVE SARS-CoV-2 target nucleic acids are DETECTED. The SARS-CoV-2 RNA is generally detectable in upper and lower  respiratory specimens dur ing the acute phase of infection.  Positive  results are indicative of active infection with SARS-CoV-2.  Clinical  correlation with patient history and other diagnostic information is   necessary to determine patient infection status.  Positive results do  not rule out bacterial infection or co-infection with other viruses. If result is PRESUMPTIVE POSTIVE SARS-CoV-2 nucleic acids MAY BE PRESENT.   A presumptive positive result was obtained on the submitted specimen  and confirmed on repeat testing.  While 2019 novel coronavirus  (SARS-CoV-2) nucleic acids may be present in the submitted sample  additional confirmatory testing may be necessary for epidemiological  and / or clinical management purposes  to differentiate between  SARS-CoV-2 and other Sarbecovirus currently known to infect humans.  If clinically indicated additional testing with an alternate test  methodology [phone]) is advised. The SARS-CoV-2 RNA is generally  detectable in upper and lower respiratory sp ecimens during the acute  phase of infection. The expected result is Negative. Fact Sheet for Patients:  StrictlyIdeas.no Fact Sheet for Healthcare Providers: BankingDealers.co.za This test is not yet approved or cleared by the [place] FDA and has been authorized for detection and/or diagnosis of SARS-CoV-2 by FDA under an Emergency Use Authorization (EUA).  This EUA will remain in effect (meaning this test can be used) for the duration of the COVID-19 declaration under Section 564(b)(1) of the Act, 21 U.S.C. section 360bbb-3(b)(1), unless the authorization is terminated or revoked sooner. Performed at [facility], [place] [address], [place], [place] [address]   Culture, Urine     Status: Abnormal   Collection Time: 05/26/19  8:10 PM   Specimen: Urine, Random  Result Value Ref Range Status   Specimen Description   Final    URINE, RANDOM Performed at [place] [address] [place], [place] [address]    Special Requests   Final    NONE Performed at [facility], [place] [address] [place], [place] [address]    Culture >=100,000 COLONIES/mL KLEBSIELLA PNEUMONIAE (A)  Final   Report Status 05/30/2019 FINAL  Final   Organism ID, Bacteria KLEBSIELLA PNEUMONIAE (A)  Final      Susceptibility   Klebsiella pneumoniae - MIC*    AMPICILLIN RESISTANT Resistant     CEFAZOLIN <=  4 SENSITIVE Sensitive     CEFTRIAXONE <=1 SENSITIVE Sensitive     CIPROFLOXACIN <=0.25 SENSITIVE Sensitive     GENTAMICIN <=1 SENSITIVE Sensitive     IMIPENEM <=0.25 SENSITIVE Sensitive     NITROFURANTOIN <=16 SENSITIVE Sensitive     TRIMETH/SULFA <=20 SENSITIVE Sensitive     AMPICILLIN/SULBACTAM 4 SENSITIVE Sensitive     PIP/TAZO <=4 SENSITIVE Sensitive     Extended ESBL NEGATIVE Sensitive     * >=100,000 COLONIES/mL KLEBSIELLA PNEUMONIAE     Labs: BNP (last 3 results) Recent Labs    07/23/18 1117 05/08/19 0218 05/26/19 0844  BNP 220.2* 254.0* 161.0*   Basic Metabolic Panel: Recent Labs  Lab 05/26/19 0844 05/27/19 0402 05/29/19 0420 05/30/19 0337 05/31/19 0728  NA 133* 133* 135 136 134*  K 4.2 4.4 3.9 3.9 3.9  CL 106 104 102 103 102  CO2 20* 20* _0 GLUCOSE 117* 215* 101* 123* 111*  BUN 53* 57* 74* 78* 85*  CREATININE 1.94* 2.05* 2.15* 1.92* 2.02*  CALCIUM 8.3* 8.3* 8.4* 8.9 8.7*  MG  --   --  2.0 2.1  --   PHOS  --   --   --  3.4 3.9   Liver Function Tests: Recent Labs  Lab 05/26/19 0844 05/27/19 0402 05/29/19 0420 05/30/19 0337 05/31/19 0728  AST _1 --   --   ALT _2 --   --   ALKPHOS 53 49 47  --   --   BILITOT 0.8 0.7 1.1  --   --   PROT 6.0* 5.8* 6.0*  --   --   ALBUMIN 2.6* 2.4* 2.8* 2.8* 2.8*   No results for input(s): LIPASE, AMYLASE in the last 168 hours. No results for input(s): AMMONIA in the last 168 hours. CBC: Recent Labs  Lab 05/27/19 0402 05/28/19 1207 05/29/19 0420 05/30/19 0337 05/31/19 0728  WBC 2.8* 5.7 3.6* 3.4* 4.9  NEUTROABS 2.4 5.3 2.6 2.4 3.4  HGB 6.9* 8.3* 8.0* 8.7* 8.8*  HCT 22.1* 25.6* 24.9* 27.2* 28.2*   MCV 94.4 91.4 92.9 92.8 94.6  PLT 138* 181 201 206 216   Cardiac Enzymes: No results for input(s): CKTOTAL, CKMB, CKMBINDEX, TROPONINI in the last 168 hours. BNP: Invalid input(s): POCBNP CBG: Recent Labs  Lab 05/30/19 0724 05/30/19 1122 05/30/19 1604 05/30/19 2209 05/31/19 0733  GLUCAP 116* 254* 293* 208* 105*   D-Dimer No results for input(s): DDIMER in the last 72 hours. Hgb A1c No results for input(s): HGBA1C in the last 72 hours. Lipid Profile No results for input(s): CHOL, HDL, LDLCALC, TRIG, CHOLHDL, LDLDIRECT in the last 72 hours. Thyroid function studies No results for input(s): TSH, T4TOTAL, T3FREE, THYROIDAB in the last 72 hours.  Invalid input(s): FREET3 Anemia work up No results for input(s): VITAMINB12, FOLATE, FERRITIN, TIBC, IRON, RETICCTPCT in the last 72 hours. Urinalysis    Component Value Date/Time   COLORURINE YELLOW 05/26/2019 [place] 05/26/2019 1646   LABSPEC 1.006 05/26/2019 1646   PHURINE 6.0 05/26/2019 1646   GLUCOSEU NEGATIVE 05/26/2019 1646   HGBUR LARGE (A) 05/26/2019 1646   BILIRUBINUR NEGATIVE 05/26/2019 1646   KETONESUR NEGATIVE 05/26/2019 1646   PROTEINUR 100 (A) 05/26/2019 1646   UROBILINOGEN 1.0 10/06/2011 1709   NITRITE NEGATIVE 05/26/2019 1646   LEUKOCYTESUR MODERATE (A) 05/26/2019 1646   Sepsis Labs Invalid input(s): PROCALCITONIN,  WBC,  LACTICIDVEN Microbiology Recent Results (from the past 240 hour(s))  SARS Coronavirus 2 (  Hospital order, Performed in [facility] hospital lab) Nasopharyngeal Nasopharyngeal Swab     Status: None   Collection Time: 05/26/19  8:44 AM   Specimen: Nasopharyngeal Swab  Result Value Ref Range Status   SARS Coronavirus 2 NEGATIVE NEGATIVE Final    Comment: (NOTE) If result is NEGATIVE SARS-CoV-2 target nucleic acids are NOT DETECTED. The SARS-CoV-2 RNA is generally detectable in upper and lower  respiratory specimens during the acute phase of infection. The lowest   concentration of SARS-CoV-2 viral copies this assay can detect is 250  copies / mL. A negative result does not preclude SARS-CoV-2 infection  and should not be used as the sole basis for treatment or other  patient management decisions.  A negative result may occur with  improper specimen collection / handling, submission of specimen other  than nasopharyngeal swab, presence of viral mutation(s) within the  areas targeted by this assay, and inadequate number of viral copies  (<250 copies / mL). A negative result must be combined with clinical  observations, patient history, and epidemiological information. If result is POSITIVE SARS-CoV-2 target nucleic acids are DETECTED. The SARS-CoV-2 RNA is generally detectable in upper and lower  respiratory specimens dur ing the acute phase of infection.  Positive  results are indicative of active infection with SARS-CoV-2.  Clinical  correlation with patient history and other diagnostic information is  necessary to determine patient infection status.  Positive results do  not rule out bacterial infection or co-infection with other viruses. If result is PRESUMPTIVE POSTIVE SARS-CoV-2 nucleic acids MAY BE PRESENT.   A presumptive positive result was obtained on the submitted specimen  and confirmed on repeat testing.  While 2019 novel coronavirus  (SARS-CoV-2) nucleic acids may be present in the submitted sample  additional confirmatory testing may be necessary for epidemiological  and / or clinical management purposes  to differentiate between  SARS-CoV-2 and other Sarbecovirus currently known to infect humans.  If clinically indicated additional testing with an alternate test  methodology [phone]) is advised. The SARS-CoV-2 RNA is generally  detectable in upper and lower respiratory sp ecimens during the acute  phase of infection. The expected result is Negative. Fact Sheet for Patients:  StrictlyIdeas.no Fact Sheet  for Healthcare Providers: BankingDealers.co.za This test is not yet approved or cleared by the [place] FDA and has been authorized for detection and/or diagnosis of SARS-CoV-2 by FDA under an Emergency Use Authorization (EUA).  This EUA will remain in effect (meaning this test can be used) for the duration of the COVID-19 declaration under Section 564(b)(1) of the Act, 21 U.S.C. section 360bbb-3(b)(1), unless the authorization is terminated or revoked sooner. Performed at [facility], [place] [address], [place], [place] [address]   Culture, Urine     Status: Abnormal   Collection Time: 05/26/19  8:10 PM   Specimen: Urine, Random  Result Value Ref Range Status   Specimen Description   Final    URINE, RANDOM Performed at [facility] [address], [place], [place] [address]    Special Requests   Final    NONE Performed at [facility], [place] [address] [place], [place] [address]    Culture >=100,000 COLONIES/mL KLEBSIELLA PNEUMONIAE (A)  Final   Report Status 05/30/2019 FINAL  Final   Organism ID, Bacteria KLEBSIELLA PNEUMONIAE (A)  Final      Susceptibility   Klebsiella pneumoniae - MIC*    AMPICILLIN RESISTANT Resistant     CEFAZOLIN <=  4 SENSITIVE Sensitive     CEFTRIAXONE <=1 SENSITIVE Sensitive     CIPROFLOXACIN <=0.25 SENSITIVE Sensitive     GENTAMICIN <=1 SENSITIVE Sensitive     IMIPENEM <=0.25 SENSITIVE Sensitive     NITROFURANTOIN <=16 SENSITIVE Sensitive     TRIMETH/SULFA <=20 SENSITIVE Sensitive     AMPICILLIN/SULBACTAM 4 SENSITIVE Sensitive     PIP/TAZO <=4 SENSITIVE Sensitive     Extended ESBL NEGATIVE Sensitive     * >=100,000 COLONIES/mL KLEBSIELLA PNEUMONIAE     Time coordinating discharge: 35 minutes  SIGNED:   Jonnie Finner, DO  [facility] 05/31/2019, 10:47 AM Pager   If 7PM-7AM, please contact night-coverage [email or web address] Password [email or web address]

## 2019-05-31 NOTE — Progress Notes (Signed)
ANTICOAGULATION CONSULT NOTE -   Pharmacy Consult for warfarin Indication: atrial fibrillation and mechanical heart valve  Allergies  Allergen Reactions  . Flagyl [Metronidazole] Rash    ALL-OVER BODY RASH  . Coreg [Carvedilol] Other (See Comments)    Terrible cramping in the feet and had a lot of bowel movements, but not diarrhea  . Losartan Swelling    Patient doesn't recall site of swelling  . Verapamil Hives  . Zetia [Ezetimibe] Other (See Comments)    Reaction not recalled  . Zocor [Simvastatin - High Dose] Other (See Comments)    Reaction not recalled    Patient Measurements: Height: 5\' 2"  (157.5 cm) Weight: 108 lb 9.6 oz (49.3 kg) IBW/kg (Calculated) : 50.1   Vital Signs: Temp: 98.2 F (36.8 C) (08/22 0553) Temp Source: Oral (08/22 0553) BP: 161/64 (08/22 0553) Pulse Rate: 57 (08/22 0553)  Labs: Recent Labs    05/28/19 1207 05/29/19 0420 05/30/19 0337 05/31/19 0355  HGB 8.3* 8.0* 8.7*  --   HCT 25.6* 24.9* 27.2*  --   PLT 181 201 206  --   LABPROT  --  24.7* 16.0* 15.6*  INR  --  2.3* 1.3* 1.3*  CREATININE  --  2.15* 1.92*  --     Estimated Creatinine Clearance: 18.2 mL/min (A) (by C-G formula based on SCr of 1.92 mg/dL (H)).   Medical History: Past Medical History:  Diagnosis Date  . Aortic atherosclerosis ([place]) 01/23/2018  . Atrial fibrillation ([place])   . Carotid artery disease ([place])   . Cholelithiasis 01/23/2018  . Chronic anticoagulation   . Coronary artery disease    status post coronary artery bypass grafting times 07/10/2004  . Diabetes mellitus   . Hypercholesteremia   . Hypertension   . Mechanical heart valve present    H. aortic valve replacement at the time of bypass surgery October 2005  . Moderate to severe pulmonary hypertension ([place])   . Peripheral arterial disease (HCC)    history of left common iliac artery PTA and stenting for a chronic total occlusion 08/26/01     Assessment: Pt presents with SOB, not on O2 at baseline.   On warfarin for Hx of AFib and mechanical heart valve. Home warfarin dose is 5mg  on M,W,F and 7.5mg  all other days.    05/31/2019 INR 1.3,subtherapeutic (held for 3 days due to supratherapeutic levels) Hgb 8.7, plts 206 (8/21) DI: doxycycline: may increase warfarin effects  Goal of Therapy:  INR 2-3 (per anti-coag notes, INR goal changed 03/2018)   Plan:  Warfarin 7.5mg  po x1 at 1800 Daily INR  [place] 05/31/2019, 7:19 AM

## 2019-06-01 NOTE — Progress Notes (Signed)
[name]  [phone] [phone]    ID: [name] DOB: 05-21-1941  MR#: [id number]  EUM#:[id number]  Patient Care Team: [name], MD as PCP - General (Internal Medicine) [name], MD as PCP - Advanced Heart Failure (Cardiology) Lorretta Harp, MD as PCP - Cardiology (Cardiology) [name], RN as [place] Management Magrinat, [name], MD as Consulting Physician (Hematology and Oncology) Arta Silence, MD as Consulting Physician (Gastroenterology) [name], MD as Consulting Physician (Cardiology) Marzetta Board, DPM as Consulting Physician (Podiatry) Gavin Pound, MD as Consulting Physician (Rheumatology) [name], [facility] as [place] Management (Pharmacist) OTHER MD:   CHIEF COMPLAINT: anemia and thrombocytopenia  CURRENT TREATMENT: Feraheme, Aranesp   INTERVAL HISTORY: [name] was to come in today but he rituxan had not been apptoved and appt cancelled  She continues on feraheme, her most recent treatment being 05/12/2019.  She also continues on aranesp, with her most recent dose on 05/19/2019. She tolerates this well.  Since her last visit, [name] was readmitted to the hospital due to worsening shortness of breath, which was thought to be related to pulmonary edema as well as anemia. On admission, her hemoglobin was noted to be 6.4.  She initially received 1 unit of packed red blood cells with improvement of her hemoglobin 7.9.    REVIEW OF SYSTEMS: [name] reports . [name] A detailed review of systems was otherwise stable.      HISTORY OF CURRENT ILLNESS: From the original consult note:  [name] is an 78 y.o. female from [place], [place].  She has a past medical history significant forPAD s/p stenting; moderate to severe pulmonary HTN; afib and AVR on Coumadin; HTN; HLD; DM; and CAD s/p CABG. the patient was admitted to the hospital  for anemia.  She was having increasing fatigue and shortness of breath and had routine lab work drawn which showed a hemoglobin of 6.6.  Due to her abnormal lab work, she was advised to come to the emergency room for admission.  She was also admitted in February 2020 for symptomatic anemia.  She reports she has been anemic for at least a year and has been taking oral iron.  She underwent a colonoscopy and EGD on 01/22/2018.  The colonoscopy showed diverticulosis in the sigmoid colon and descending colon, internal hemorrhoids.  The upper endoscopy was normal except for erythematous mucosa in the antrum.  She had a repeat EGD on 11/21/2018 that showed a few minimally oozing( with scope trauma) superficial gastric ulcers with pigmented material were found in the gastric body. The largest lesion was 4 mm in largest dimension.  She underwent another upper endoscopy on 02/27/2019 which showed patchy mildly friable mucosa with contact bleeding was found in the gastric fundus, in the gastric body and in the gastric antrum.  The patient is maintained on warfarin due to A. fib and a mechanical aortic valve.  Review of the patient's chart shows that she has been anemic dating back to at least December thousand 12 (these are the earliest labs available to me).  Her highest hemoglobin was 11.6 in December 2012.  I also note that the patient has had intermittent mild leukopenia adding back to at least April 2019.  Her lowest white blood cell count was 2.6 in April 2019.  Since admission, the patient has received 1 unit of packed red blood cells.  She is currently on heparin for her procedure  with plan to bridge back to Coumadin.  Stool for occult blood was negative x1 on 02/24/2019.  Hematology was asked see the patient to make recommendations regarding her anemia.  [name] subsequent history is as detailed below.   PAST MEDICAL HISTORY: Past Medical History:  Diagnosis Date  . Aortic atherosclerosis ([place]) 01/23/2018   . Atrial fibrillation ([place])   . Carotid artery disease ([place])   . Cholelithiasis 01/23/2018  . Chronic anticoagulation   . Coronary artery disease    status post coronary artery bypass grafting times 07/10/2004  . Diabetes mellitus   . Hypercholesteremia   . Hypertension   . Mechanical heart valve present    H. aortic valve replacement at the time of bypass surgery October 2005  . Moderate to severe pulmonary hypertension ([place])   . Peripheral arterial disease (HCC)    history of left common iliac artery PTA and stenting for a chronic total occlusion 08/26/01     PAST SURGICAL HISTORY: Past Surgical History:  Procedure Laterality Date  . AORTIC VALVE REPLACEMENT    . CARDIAC CATHETERIZATION  11/10/2004   40% right common illiac, 70% in stent restenosis of distal left common illiac,   . CARDIAC CATHETERIZATION  05/18/2004   LAD 50-70% midstenosis, RCA dominant w/50% stenosis, 50% Right common Illiac artery ostial stenosis, 90% in stent restenosis within midportion of left common illiac stent  . Carotid Duplex  03/12/2012   RSA-elev. velocities suggestive of a 50-69% diameter reduction, Right&Left Bulb/Prox ICA-mild-mod.fibrous plaqueelevating Velocities abnormal study.  . CHOLECYSTECTOMY N/A 03/01/2018   Procedure: LAPAROSCOPIC CHOLECYSTECTOMY;  Surgeon: Judeth Horn, MD;  Location: [facility];  Service: General;  Laterality: N/A;  . COLONOSCOPY WITH PROPOFOL N/A 01/22/2018   Procedure: COLONOSCOPY WITH PROPOFOL;  Surgeon: Wilford Corner, MD;  Location: [facility];  Service: Endoscopy;  Laterality: N/A;  . ESOPHAGOGASTRODUODENOSCOPY (EGD) WITH PROPOFOL N/A 01/22/2018   Procedure: ESOPHAGOGASTRODUODENOSCOPY (EGD) WITH PROPOFOL;  Surgeon: Wilford Corner, MD;  Location: [facility];  Service: Endoscopy;  Laterality: N/A;  . ESOPHAGOGASTRODUODENOSCOPY (EGD) WITH PROPOFOL N/A 11/21/2018   Procedure: ESOPHAGOGASTRODUODENOSCOPY (EGD) WITH PROPOFOL;  Surgeon: [name], MD;  Location: [facility];  Service: Gastroenterology;  Laterality: N/A;  . ESOPHAGOGASTRODUODENOSCOPY (EGD) WITH PROPOFOL Left 02/27/2019   Procedure: ESOPHAGOGASTRODUODENOSCOPY (EGD) WITH PROPOFOL;  Surgeon: Arta Silence, MD;  [facility] END[facility]COPY;  Service: Endoscopy;  Laterality: Left;  . GIVENS CAPSULE STUDY N/A 11/21/2018   Procedure: GIVENS CAPSULE STUDY;  Surgeon: [name], MD;  Location: [facility];  Service: Gastroenterology;  Laterality: N/A;  To be deployed during EGD  . Lower Ext. Duplex  03/12/2012   Right Proximal CIA- vessel narrowing w/elevated velocities 0-49% diameter reduction. Right SFA-mild mixed density plaque throughout vessel.  [name] NM MYOCAR PERF WALL MOTION  05/19/2010   protocol: Persantine, post stress EF 65%, negative for ischemia, low risk scan  . RIGHT HEART CATH N/A 06/27/2018   Procedure: RIGHT HEART CATH;  Surgeon: [name], MD;  Location: [facility] CV LAB;  Service: Cardiovascular;  Laterality: N/A;  . TRANSTHORACIC ECHOCARDIOGRAM  08/29/2012   Moderately calcified annulus of mitral valve, moderate regurg. of both mitral valve and tricuspid valve.     FAMILY HISTORY: Family History  Problem Relation Age of Onset  . Breast cancer Neg Hx     GYNECOLOGIC HISTORY:  No LMP recorded. Patient has had a hysterectomy. Menarche:  years old Age at first live birth:  [place] P: 3 LMP:  Contraceptive:  HRT:   Hysterectomy?: yes BSO?:  SOCIAL HISTORY: (Current as of 03/07/2019) [name] is widowed.  She lives alone here in [place], [place].  She has 3 children that live locally.  She smoked three quarters of a pack of cigarettes daily for 50 years.  She quit in 2005.  Denies alcohol use.   ADVANCED DIRECTIVES: Not in place    HEALTH MAINTENANCE: Social History   Tobacco Use  . Smoking status: Former Smoker    Packs/day: 1.00    Years: 30.00    Pack years: 30.00    Types: Cigarettes  . Smokeless tobacco: Never Used  . Tobacco comment: quit  smoking 2005  Substance Use Topics  . Alcohol use: No    Alcohol/week: 0.0 standard drinks  . Drug use: No    Colonoscopy: EGD on 02/27/2019 under Dr. Paulita Fujita found some friable gastric mucosa.  Colonoscopy under Dr. Michail Sermon 01/22/2018 showed significant diverticular disease  PAP:   Bone density:  Mammography: 02/25/2017, breast density category B.  Allergies  Allergen Reactions  . Flagyl [Metronidazole] Rash    ALL-OVER BODY RASH  . Coreg [Carvedilol] Other (See Comments)    Terrible cramping in the feet and had a lot of bowel movements, but not diarrhea  . Losartan Swelling    Patient doesn't recall site of swelling  . Verapamil Hives  . Zetia [Ezetimibe] Other (See Comments)    Reaction not recalled  . Zocor [Simvastatin - High Dose] Other (See Comments)    Reaction not recalled    Current Outpatient Medications  Medication Sig Dispense Refill  . acetaminophen (TYLENOL) 325 MG tablet Take 325 mg by mouth every 6 (six) hours as needed for mild pain.     [name] allopurinol (ZYLOPRIM) 100 MG tablet Take 100 mg by mouth daily.    [name] ALPRAZolam (XANAX) 0.5 MG tablet Take 0.25 mg by mouth at bedtime.     [name] atorvastatin (LIPITOR) 80 MG tablet Take 1 tablet (80 mg total) by mouth daily at 6 PM.    . Cholecalciferol (VITAMIN D3) 1000 UNITS CAPS Take 1,000 Units by mouth daily with lunch.     . cloNIDine (CATAPRES) 0.1 MG tablet Take 1 tablet (0.1 mg total) by mouth 2 (two) times daily. 60 tablet 1  . Cyanocobalamin (VITAMIN B12) 1000 MCG TBCR Take 1,000 mcg by mouth daily with lunch.     . diphenhydrAMINE-zinc acetate (BENADRYL) cream Apply topically 3 (three) times daily as needed for itching. 28.4 g 0  . doxycycline (VIBRA-TABS) 100 MG tablet Take 1 tablet (100 mg total) by mouth every 12 (twelve) hours for 5 doses. 5 tablet 0  . feeding supplement, GLUCERNA SHAKE, (GLUCERNA SHAKE) LIQD Take 237 mLs by mouth 2 (two) times daily between meals. (Patient not taking: Reported on 05/08/2019) 237  mL 30  . furosemide (LASIX) 40 MG tablet Take 1.5 tablets (60 mg total) by mouth daily. 135 tablet 6  . hydrALAZINE (APRESOLINE) 100 MG tablet TAKE 1 TABLET (100 MG TOTAL) BY MOUTH 3 (THREE) TIMES DAILY. 270 tablet 1  . magnesium oxide (MAG-OX) 400 (241.3 Mg) MG tablet Take 1 tablet (400 mg total) by mouth daily.    . metFORMIN (GLUCOPHAGE) 500 MG tablet Take 500 mg by mouth 2 (two) times daily with a meal.     . metoprolol tartrate (LOPRESSOR) 25 MG tablet Take 0.5 tablets (12.5 mg total) by mouth 2 (two) times daily. 60 tablet 0  . ondansetron (ZOFRAN ODT) 4 MG disintegrating tablet Take 1 tablet (4 mg total) by mouth  every 8 (eight) hours as needed for nausea or vomiting. 6 tablet 0  . pantoprazole (PROTONIX) 40 MG tablet Take 40 mg by mouth 2 (two) times daily.     . predniSONE (DELTASONE) 20 MG tablet Take 2 tablets (40 mg total) by mouth daily with breakfast. 60 tablet 0  . spironolactone (ALDACTONE) 25 MG tablet Take 25 mg by mouth daily.    . tadalafil, PAH, (ADCIRCA) 20 MG tablet Take 2 tablets (40 mg total) by mouth daily. 60 tablet 6  . vitamin C (ASCORBIC ACID) 500 MG tablet Take 500 mg by mouth daily with lunch.    . warfarin (COUMADIN) 5 MG tablet Take 5-7.5 mg by mouth daily. Take 1.5 tablets on Tues, Thursday, Saturday and Sunday. Take 1 tablet on Mondays, Wednesdays, and Fridays.     No current facility-administered medications for this visit.      OBJECTIVE: Elderly white woman examined in a wheelchair  There were no vitals filed for this visit.   There is no height or weight on file to calculate BMI.   Wt Readings from Last 3 Encounters:  05/31/19 108 lb 9.6 oz (49.3 kg)  05/19/19 123 lb 9.6 oz (56.1 kg)  05/09/19 119 lb 3.2 oz (54.1 kg)      ECOG FS:1 - Symptomatic but completely ambulatory  Sclerae unicteric, EOMs intact Wearing a mask No cervical or supraclavicular adenopathy Lungs no rales or rhonchi Heart regular rate and rhythm Abd soft, nontender, positive  bowel sounds MSK no focal spinal tenderness, no upper extremity lymphedema Neuro: nonfocal, well oriented, appropriate affect Breasts:       LAB RESULTS:  CMP     Component Value Date/Time   NA 134 (L) 05/31/2019 0728   NA 137 12/06/2017 1436   K 3.9 05/31/2019 0728   CL 102 05/31/2019 0728   CO2 23 05/31/2019 0728   GLUCOSE 111 (H) 05/31/2019 0728   BUN 85 (H) 05/31/2019 0728   BUN 20 12/06/2017 1436   CREATININE 2.02 (H) 05/31/2019 0728   CREATININE 1.81 (H) 05/12/2019 1215   CALCIUM 8.7 (L) 05/31/2019 0728   PROT 6.0 (L) 05/29/2019 0420   ALBUMIN 2.8 (L) 05/31/2019 0728   AST 21 05/29/2019 0420   AST 20 05/12/2019 1215   ALT 15 05/29/2019 0420   ALT 12 05/12/2019 1215   ALKPHOS 47 05/29/2019 0420   BILITOT 1.1 05/29/2019 0420   BILITOT 0.7 05/12/2019 1215   GFRNONAA 23 (L) 05/31/2019 0728   GFRNONAA 26 (L) 05/12/2019 1215   GFRAA 26 (L) 05/31/2019 0728   GFRAA 30 (L) 05/12/2019 1215    Lab Results  Component Value Date   TOTALPROTELP 6.9 05/07/2019    No results found for: KPAFRELGTCHN, LAMBDASER, [facility]  Lab Results  Component Value Date   WBC 4.9 05/31/2019   NEUTROABS 3.4 05/31/2019   HGB 8.8 (L) 05/31/2019   HCT 28.2 (L) 05/31/2019   MCV 94.6 05/31/2019   PLT 216 05/31/2019    @LASTCHEMISTRY @  No results found for: LABCA2  No components found for: [id number]  Recent Labs  Lab 05/31/19 0355  INR 1.3*    No results found for: LABCA2  No results found for: YYT035  No results found for: WSF681  No results found for: EXN170  No results found for: CA2729  No components found for: HGQUANT  No results found for: CEA1 / No results found for: CEA1   No results found for: AFPTUMOR  No results found for: CHROMOGRNA  No  results found for: PSA1  No results displayed because visit has over 200 results.      (this displays the last labs from the last 3 days)  Lab Results  Component Value Date   TOTALPROTELP 6.9 05/07/2019    (this displays SPEP labs)  No results found for: KPAFRELGTCHN, LAMBDASER, KAPLAMBRATIO (kappa/lambda light chains)  No results found for: HGBA, HGBA2QUANT, HGBFQUANT, HGBSQUAN (Hemoglobinopathy evaluation)   Lab Results  Component Value Date   LDH 197 (H) 02/27/2019    Lab Results  Component Value Date   IRON 51 05/26/2019   TIBC 239 (L) 05/26/2019   IRONPCTSAT 21 05/26/2019   (Iron and TIBC)  Lab Results  Component Value Date   FERRITIN 178 05/26/2019    Urinalysis    Component Value Date/Time   COLORURINE YELLOW 05/26/2019 [place] 05/26/2019 1646   LABSPEC 1.006 05/26/2019 1646   PHURINE 6.0 05/26/2019 1646   GLUCOSEU NEGATIVE 05/26/2019 1646   HGBUR LARGE (A) 05/26/2019 1646   BILIRUBINUR NEGATIVE 05/26/2019 [place] 05/26/2019 1646   PROTEINUR 100 (A) 05/26/2019 1646   UROBILINOGEN 1.0 10/06/2011 1709   NITRITE NEGATIVE 05/26/2019 1646   LEUKOCYTESUR MODERATE (A) 05/26/2019 1646     STUDIES:  Dg Chest Port 1 View  Result Date: 05/28/2019 CLINICAL DATA:  Pulmonary edema EXAM: PORTABLE CHEST 1 VIEW COMPARISON:  05/26/2019 FINDINGS: Diffuse bilateral interstitial and alveolar airspace opacities similar in appearance to the prior exam. Calcified right upper lobe pulmonary nodule and left lower lobe pulmonary nodule consistent with sequela prior granulomatous disease. Trace bilateral pleural effusions. Hyperinflated bilateral lungs as can be seen with COPD. No pneumothorax. Stable cardiomegaly. Prior CABG. No acute osseous abnormality. IMPRESSION: Findings most concerning for persistent pulmonary edema. Electronically Signed   By: Kathreen Devoid   On: 05/28/2019 06:55   Dg Chest Portable 1 View  Result Date: 05/26/2019 CLINICAL DATA:  Weakness and shortness of breath since last night. EXAM: PORTABLE CHEST 1 VIEW COMPARISON:  05/08/2019. FINDINGS: Stable enlarged cardiac silhouette and post CABG changes. Increased diffuse prominence  of the interstitial markings, especially on the right, with probable mild interspersed airspace opacities. Kerley lines are noted at the right lung base. No pleural fluid is seen. Bilateral calcified granulomata are again noted. Also noted are coarse left breast calcifications compatible with degenerated fibroadenomata. Diffuse osteopenia. Mild to moderate left and minimal right glenohumeral joint degenerative changes. IMPRESSION: 1. Worsening pulmonary edema or pneumonia/pneumonitis. 2. Stable cardiomegaly. Electronically Signed   By: Claudie Revering M.D.   On: 05/26/2019 09:01   Dg Chest Portable 1 View  Result Date: 05/08/2019 CLINICAL DATA:  Fever EXAM: PORTABLE CHEST 1 VIEW COMPARISON:  04/04/2019, 02/28/2019 FINDINGS: Post sternotomy changes. Cardiomegaly with vascular congestion. Diffuse bilateral interstitial and ground-glass opacity, possible edema. More confluent opacity at the right base may reflect superimposed pneumonia. No pneumothorax. IMPRESSION: 1. Cardiomegaly with vascular congestion and diffuse bilateral interstitial and ground-glass opacity suggesting pulmonary edema. 2. More confluent airspace disease at the right base, could reflect superimposed pneumonia Electronically Signed   By: Donavan Foil M.D.   On: 05/08/2019 02:25     ELIGIBLE FOR AVAILABLE RESEARCH PROTOCOL: no   ASSESSMENT: 78 y.o. [place] woman with multifactorial anemia, as follows:             (a) anemia of chronic inflammation: As of May 2020 she has a SED rate of 95, CRP 1.1, positive ANA and RF; she has been referred to rheumatology with  an appointment pending 03/27/2019 inflammation makes iron stores relatively inaccessible for hematopoiesis and blunts the effect of native EPO             (b) hemolysis (mild): she has a mildly elevated LDH and a DAT positive for complement, not IgG; this is c/w (a) above             (c) anemia of renal insufficiency: inadequate EPO resonse to anemia            (d) occult  (d)  GIB/ iron deficiency in patient on lifelong anticoagulaton  (1) Feraheme received 03/01/2019, to be repeated 03/20/2019   (a) reticulocyte 03/07/2019 up from 43.4 a year ago to 191.8 after iron infusion  (2) darbepoetin: starting 05/05/2019 at 143mcg given every 2 weeks.    (3) prednisone 10 mg daily started on 05/19/2019    PLAN:    Magrinat, [name], MD  06/01/19 1:35 PM Medical Oncology and Hematology [facility] [address] [place], [place] [address] Tel. [phone]    Fax. [phone]   I, Wilburn Mylar, am acting as scribe for Dr. [name]. Magrinat.

## 2019-06-02 ENCOUNTER — Telehealth: Payer: Self-pay | Admitting: Adult Health

## 2019-06-02 ENCOUNTER — Inpatient Hospital Stay (HOSPITAL_BASED_OUTPATIENT_CLINIC_OR_DEPARTMENT_OTHER): Payer: Medicare HMO | Admitting: Oncology

## 2019-06-02 ENCOUNTER — Other Ambulatory Visit: Payer: Self-pay | Admitting: Adult Health

## 2019-06-02 ENCOUNTER — Telehealth: Payer: Self-pay | Admitting: *Deleted

## 2019-06-02 ENCOUNTER — Ambulatory Visit: Payer: Medicare HMO

## 2019-06-02 ENCOUNTER — Inpatient Hospital Stay: Payer: Medicare HMO

## 2019-06-02 ENCOUNTER — Other Ambulatory Visit: Payer: Self-pay | Admitting: *Deleted

## 2019-06-02 DIAGNOSIS — I776 Arteritis, unspecified: Secondary | ICD-10-CM

## 2019-06-02 NOTE — Telephone Encounter (Signed)
Spent approximately 45 minutes on the phone requesting an appeal for patient Rit[place]ian [phone].  Representative that I spoke with spoke very unclearly, and was difficult to understand as she had difficulty pronouncing medical terminology clearly. Appeal was filed, Reference number [id number], phone number [phone].  Wilber Bihari, NP

## 2019-06-02 NOTE — Patient Outreach (Signed)
[facility]) Care Management  06/02/2019  [name] [birth year] [id number]   Noted that member was discharged on 8/22.  Call placed to member, state she is "trying to make it."  State she is still having shortness of breath and pain/discomfort.  Has appointment with oncologist today for iron infusion.  Appointment with rheumatologist is the end of September, unsure of exact date.  Advised to contact office for earlier appointment.  Also advised to also contact primary MD office for follow up.  State her friend will take her today and her son will pick her up.  This care manager inquired about moving with her daughter in [place], state she changed her mind because she wanted to be closer to her doctors.  She continues to live alone, advised to reconsider as she has not completely recovered.  Denies any urgent concerns at this time, will follow up within the next 2 weeks.  [facility] CM Care Plan Problem One     Most Recent Value  Care Plan Problem One  Risk for hospitalization related to anemia as evidenced by recent hospital admission  Role Documenting the Problem One  Care Management [place] for Problem One  Active  [place] Term Goal   Member will not be readmitted to hospital within the next 31 days  [facility] Long Term Goal Start Date  06/02/19  Interventions for Problem One Long Term Goal  Reviewed most recent discharge instructions with member.    [facility] CM Short Term Goal #1   Member will contact rheumatology office for sooner appointment within the next week  [facility] CM Short Term Goal #1 Start Date  06/02/19  Interventions for Short Term Goal #1  Upcoming appointments reviewed with member, advised of recommendations to follow up with rheumatology within the next week  [facility] CM Short Term Goal #4  Member will have plan for life alert/emergency alert system within the next 2 weeks  [facility] CM Short Term Goal #4 Start Date  06/02/19 [Date reset]  Interventions for Short Term  Goal #4  Discussed concerns for member's safety living alone.  Encouraged to reconsider staying with daughter     [name], RN, MSN [place] Manager [phone]

## 2019-06-03 ENCOUNTER — Encounter (HOSPITAL_COMMUNITY)
Admission: RE | Admit: 2019-06-03 | Discharge: 2019-06-03 | Disposition: A | Discharge status: Home / Self Care | Payer: Medicare HMO | Source: Ambulatory Visit | Attending: Cardiology | Admitting: Cardiology

## 2019-06-03 ENCOUNTER — Other Ambulatory Visit: Payer: Self-pay

## 2019-06-03 DIAGNOSIS — I5032 Chronic diastolic (congestive) heart failure: Secondary | ICD-10-CM | POA: Insufficient documentation

## 2019-06-03 DIAGNOSIS — I509 Heart failure, unspecified: Secondary | ICD-10-CM | POA: Diagnosis not present

## 2019-06-03 MED ORDER — TECHNETIUM TC 99M PYROPHOSPHATE
21.3000 | Freq: Once | INTRAVENOUS | Status: AC | PRN
  Administered 2019-06-03: 10:00:00 21.3 via INTRAVENOUS
  Filled 2019-06-03: qty 22

## 2019-06-04 ENCOUNTER — Ambulatory Visit (INDEPENDENT_AMBULATORY_CARE_PROVIDER_SITE_OTHER): Payer: Medicare HMO | Admitting: Pharmacist Clinician (PhC)/ Clinical Pharmacy Specialist

## 2019-06-04 DIAGNOSIS — I4891 Unspecified atrial fibrillation: Secondary | ICD-10-CM

## 2019-06-04 DIAGNOSIS — Z7901 Long term (current) use of anticoagulants: Secondary | ICD-10-CM

## 2019-06-04 DIAGNOSIS — Z952 Presence of prosthetic heart valve: Secondary | ICD-10-CM | POA: Diagnosis not present

## 2019-06-04 LAB — POCT INR: INR: 1.7 — AB (ref 2.0–3.0)

## 2019-06-04 NOTE — Progress Notes (Signed)
[place]  [phone] Fax:(336) [phone]    ID: [name] DOB: [birth year]  MR#: [id number]  XKG#:[id number]  Patient Care Team: [name], MD as PCP - General (Internal Medicine) [name], MD as PCP - Advanced Heart Failure (Cardiology) Lorretta Harp, MD as PCP - Cardiology (Cardiology) [name], RN as [facility] Kortlynn Poust, [name], MD as Consulting Physician (Hematology and Oncology) Arta Silence, MD as Consulting Physician (Gastroenterology) [name], MD as Consulting Physician (Cardiology) Marzetta Board, DPM as Consulting Physician (Podiatry) Gavin Pound, MD as Consulting Physician (Rheumatology) [name], [facility] as [facility] (Pharmacist) OTHER MD:   CHIEF COMPLAINT: anemia and thrombocytopenia  CURRENT TREATMENT: [Feraheme], Aranesp, prednisone, rituximab   INTERVAL HISTORY: [name] returns today for follow up of her anemia and thrombocytopenia.  She continues on feraheme, her most recent treatment being 05/12/2019.  Her most recent ferritin on 05/26/2019 was 178.  She also continues on aranesp, with her most recent dose on 05/19/2019. She tolerates this well.  Since her last visit, she underwent SPECT testing for transthyretin amyloidosis on 06/03/2019, which was negative.  We have finally obtained approval for rituximab for her and she will receive the first dose today.  We are only doing 100 mg because of concerns regarding tolerance but she will receive the full dose next week  She was started on prednisone at 40 mg in the morning at her last hospitalization.  We were able to check her sugars during the hospitalization and right now.  And they were fine.  She unfortunately is not sick   REVIEW OF SYSTEMS: [name] is tolerating the prednisone well.  We discussed issues regarding osteopenia, gastritis, peripheral edema, thrush, and  other possible side effects.  So far she is doing very well with it.  We also discussed the rituximab which she will be receiving today and its possible complications.  She has had no fever, rash, or bleeding since the last visit here.  She denies cough phlegm production or pleurisy.  She is very tired still.  Detailed review of systems today was otherwise stable.   HISTORY OF CURRENT ILLNESS: From the original consult note:  [name] is an 78 y.o. female from [place], [place].  She has a past medical history significant forPAD s/p stenting; moderate to severe pulmonary HTN; afib and AVR on Coumadin; HTN; HLD; DM; and CAD s/p CABG. the patient was admitted to the hospital for anemia.  She was having increasing fatigue and shortness of breath and had routine lab work drawn which showed a hemoglobin of 6.6.  Due to her abnormal lab work, she was advised to come to the emergency room for admission.  She was also admitted in February 2020 for symptomatic anemia.  She reports she has been anemic for at least a year and has been taking oral iron.  She underwent a colonoscopy and EGD on 01/22/2018.  The colonoscopy showed diverticulosis in the sigmoid colon and descending colon, internal hemorrhoids.  The upper endoscopy was normal except for erythematous mucosa in the antrum.  She had a repeat EGD on 11/21/2018 that showed a few minimally oozing( with scope trauma) superficial gastric ulcers with pigmented material were found in the gastric body. The largest lesion was 4 mm in largest dimension.  She underwent another upper endoscopy on 02/27/2019 which showed patchy mildly friable mucosa with contact bleeding was found in the gastric fundus, in  the gastric body and in the gastric antrum.  The patient is maintained on warfarin due to A. fib and a mechanical aortic valve.  Review of the patient's chart shows that she has been anemic dating back to at least December thousand 12 (these are the earliest  labs available to me).  Her highest hemoglobin was 11.6 in December 2012.  I also note that the patient has had intermittent mild leukopenia adding back to at least April 2019.  Her lowest white blood cell count was 2.6 in April 2019.  Since admission, the patient has received 1 unit of packed red blood cells.  She is currently on heparin for her procedure with plan to bridge back to Coumadin.  Stool for occult blood was negative x1 on 02/24/2019.  Hematology was asked see the patient to make recommendations regarding her anemia.  [name] subsequent history is as detailed below.   PAST MEDICAL HISTORY: Past Medical History:  Diagnosis Date   Aortic atherosclerosis ([place]) 01/23/2018   Atrial fibrillation (H[facility])    Carotid artery disease ([place])    Cholelithiasis 01/23/2018   Chronic anticoagulation    Coronary artery disease    status post coronary artery bypass grafting times 07/10/2004   Diabetes mellitus    Hypercholesteremia    Hypertension    Mechanical heart valve present    H. aortic valve replacement at the time of bypass surgery October 2005   Moderate to severe pulmonary hypertension ([place])    Peripheral arterial disease ([place])    history of left common iliac artery PTA and stenting for a chronic total occlusion 08/26/01     PAST SURGICAL HISTORY: Past Surgical History:  Procedure Laterality Date   AORTIC VALVE REPLACEMENT     CARDIAC CATHETERIZATION  11/10/2004   40% right common illiac, 70% in stent restenosis of distal left common illiac,    CARDIAC CATHETERIZATION  05/18/2004   LAD 50-70% midstenosis, RCA dominant w/50% stenosis, 50% Right common Illiac artery ostial stenosis, 90% in stent restenosis within midportion of left common illiac stent   Carotid Duplex  03/12/2012   RSA-elev. velocities suggestive of a 50-69% diameter reduction, Right&Left Bulb/Prox ICA-mild-mod.fibrous plaqueelevating Velocities abnormal study.   CHOLECYSTECTOMY N/A  03/01/2018   Procedure: LAPAROSCOPIC CHOLECYSTECTOMY;  Surgeon: Judeth Horn, MD;  Location: [place];  Service: General;  Laterality: N/A;   COLONOSCOPY WITH PROPOFOL N/A 01/22/2018   Procedure: COLONOSCOPY WITH PROPOFOL;  Surgeon: Wilford Corner, MD;  Location: [place];  Service: Endoscopy;  Laterality: N/A;   ESOPHAGOGASTRODUODENOSCOPY (EGD) WITH PROPOFOL N/A 01/22/2018   Procedure: ESOPHAGOGASTRODUODENOSCOPY (EGD) WITH PROPOFOL;  Surgeon: Wilford Corner, MD;  Location: [facility];  Service: Endoscopy;  Laterality: N/A;   ESOPHAGOGASTRODUODENOSCOPY (EGD) WITH PROPOFOL N/A 11/21/2018   Procedure: ESOPHAGOGASTRODUODENOSCOPY (EGD) WITH PROPOFOL;  Surgeon: [name], MD;  Location: [place];  Service: Gastroenterology;  Laterality: N/A;   ESOPHAGOGASTRODUODENOSCOPY (EGD) WITH PROPOFOL Left 02/27/2019   Procedure: ESOPHAGOGASTRODUODENOSCOPY (EGD) WITH PROPOFOL;  Surgeon: Arta Silence, MD;  Location: [facility] ENDOSCOPY;  Service: Endoscopy;  Laterality: Left;   GIVENS CAPSULE STUDY N/A 11/21/2018   Procedure: GIVENS CAPSULE STUDY;  Surgeon: [name], MD;  Location: [place];  Service: Gastroenterology;  Laterality: N/A;  To be deployed during EGD   Lower Ext. Duplex  03/12/2012   Right Proximal CIA- vessel narrowing w/elevated velocities 0-49% diameter reduction. Right SFA-mild mixed density plaque throughout vessel.   NM MYOCAR PERF WALL MOTION  05/19/2010   protocol: Persantine, post stress EF 65%, negative for ischemia,  low risk scan   RIGHT HEART CATH N/A 06/27/2018   Procedure: RIGHT HEART CATH;  Surgeon: [name], MD;  Location: [facility] CV LAB;  Service: Cardiovascular;  Laterality: N/A;   TRANSTHORACIC ECHOCARDIOGRAM  08/29/2012   Moderately calcified annulus of mitral valve, moderate regurg. of both mitral valve and tricuspid valve.     FAMILY HISTORY: Family History  Problem Relation Age of Onset   Breast cancer Neg Hx     GYNECOLOGIC HISTORY:  No  LMP recorded. Patient has had a hysterectomy. Menarche:  years old Age at first live birth:  [place] P: 3 LMP:  Contraceptive:  HRT:   Hysterectomy?: yes BSO?:    SOCIAL HISTORY: (Current as of 03/07/2019) Coila is widowed.  She lives alone here in [place], [place].  She has 3 children that live locally.  She smoked three quarters of a pack of cigarettes daily for 50 years.  She quit in 2005.  Denies alcohol use.   ADVANCED DIRECTIVES: Not in place    HEALTH MAINTENANCE: Social History   Tobacco Use   Smoking status: Former Smoker    Packs/day: 1.00    Years: 30.00    Pack years: 30.00    Types: Cigarettes   Smokeless tobacco: Never Used   Tobacco comment: quit smoking 2005  Substance Use Topics   Alcohol use: No    Alcohol/week: 0.0 standard drinks   Drug use: No    Colonoscopy: EGD on 02/27/2019 under Dr. Paulita Fujita found some friable gastric mucosa.  Colonoscopy under Dr. Michail Sermon 01/22/2018 showed significant diverticular disease  PAP:   Bone density:  Mammography: 02/25/2017, breast density category B.  Allergies  Allergen Reactions   Flagyl [Metronidazole] Rash    ALL-OVER BODY RASH   Coreg [Carvedilol] Other (See Comments)    Terrible cramping in the feet and had a lot of bowel movements, but not diarrhea   Losartan Swelling    Patient doesn't recall site of swelling   Verapamil Hives   Zetia [Ezetimibe] Other (See Comments)    Reaction not recalled   Zocor [Simvastatin - High Dose] Other (See Comments)    Reaction not recalled    Current Outpatient Medications  Medication Sig Dispense Refill   acetaminophen (TYLENOL) 325 MG tablet Take 325 mg by mouth every 6 (six) hours as needed for mild pain.      allopurinol (ZYLOPRIM) 100 MG tablet Take 100 mg by mouth daily.     ALPRAZolam (XANAX) 0.5 MG tablet Take 0.25 mg by mouth at bedtime.      atorvastatin (LIPITOR) 80 MG tablet Take 1 tablet (80 mg total) by mouth daily at 6 PM.      Cholecalciferol (VITAMIN D3) 1000 UNITS CAPS Take 1,000 Units by mouth daily with lunch.      cloNIDine (CATAPRES) 0.1 MG tablet Take 1 tablet (0.1 mg total) by mouth 2 (two) times daily. 60 tablet 1   Cyanocobalamin (VITAMIN B12) 1000 MCG TBCR Take 1,000 mcg by mouth daily with lunch.      diphenhydrAMINE-zinc acetate (BENADRYL) cream Apply topically 3 (three) times daily as needed for itching. 28.4 g 0   feeding supplement, GLUCERNA SHAKE, (GLUCERNA SHAKE) LIQD Take 237 mLs by mouth 2 (two) times daily between meals. (Patient not taking: Reported on 05/08/2019) 237 mL 30   furosemide (LASIX) 40 MG tablet Take 1.5 tablets (60 mg total) by mouth daily. 135 tablet 6   hydrALAZINE (APRESOLINE) 100 MG tablet TAKE 1 TABLET (100 MG  TOTAL) BY MOUTH 3 (THREE) TIMES DAILY. 270 tablet 1   magnesium oxide (MAG-OX) 400 (241.3 Mg) MG tablet Take 1 tablet (400 mg total) by mouth daily.     metFORMIN (GLUCOPHAGE) 500 MG tablet Take 500 mg by mouth 2 (two) times daily with a meal.      metoprolol tartrate (LOPRESSOR) 25 MG tablet Take 0.5 tablets (12.5 mg total) by mouth 2 (two) times daily. 60 tablet 0   ondansetron (ZOFRAN ODT) 4 MG disintegrating tablet Take 1 tablet (4 mg total) by mouth every 8 (eight) hours as needed for nausea or vomiting. 6 tablet 0   pantoprazole (PROTONIX) 40 MG tablet Take 40 mg by mouth 2 (two) times daily.      predniSONE (DELTASONE) 20 MG tablet Take 2 tablets (40 mg total) by mouth daily with breakfast. 60 tablet 0   spironolactone (ALDACTONE) 25 MG tablet Take 25 mg by mouth daily.     tadalafil, PAH, (ADCIRCA) 20 MG tablet Take 2 tablets (40 mg total) by mouth daily. 60 tablet 6   vitamin C (ASCORBIC ACID) 500 MG tablet Take 500 mg by mouth daily with lunch.     warfarin (COUMADIN) 5 MG tablet Take 5-7.5 mg by mouth daily. Take 1.5 tablets on Tues, Thursday, Saturday and Sunday. Take 1 tablet on Mondays, Wednesdays, and Fridays.     No current facility-administered  medications for this visit.      OBJECTIVE: Elderly white woman examined in a wheelchair  There were no vitals filed for this visit.   There is no height or weight on file to calculate BMI.   Wt Readings from Last 3 Encounters:  05/31/19 108 lb 9.6 oz (49.3 kg)  05/19/19 123 lb 9.6 oz (56.1 kg)  05/09/19 119 lb 3.2 oz (54.1 kg)      ECOG FS:2 - Symptomatic, <50% confined to bed  Sclerae unicteric, EOMs intact Wearing a mask No cervical or supraclavicular adenopathy Lungs no rales or rhonchi Heart regular rate and rhythm Abd soft, nontender, positive bowel sounds MSK no focal spinal tenderness, no upper extremity lymphedema Neuro: nonfocal, well oriented, appropriate affect Breasts: Deferred   LAB RESULTS:  CMP     Component Value Date/Time   NA 134 (L) 05/31/2019 0728   NA 137 12/06/2017 1436   K 3.9 05/31/2019 0728   CL 102 05/31/2019 0728   CO2 23 05/31/2019 0728   GLUCOSE 111 (H) 05/31/2019 0728   BUN 85 (H) 05/31/2019 0728   BUN 20 12/06/2017 1436   CREATININE 2.02 (H) 05/31/2019 0728   CREATININE 1.81 (H) 05/12/2019 1215   CALCIUM 8.7 (L) 05/31/2019 0728   PROT 6.0 (L) 05/29/2019 0420   ALBUMIN 2.8 (L) 05/31/2019 0728   AST 21 05/29/2019 0420   AST 20 05/12/2019 1215   ALT 15 05/29/2019 0420   ALT 12 05/12/2019 1215   ALKPHOS 47 05/29/2019 0420   BILITOT 1.1 05/29/2019 0420   BILITOT 0.7 05/12/2019 1215   GFRNONAA 23 (L) 05/31/2019 0728   GFRNONAA 26 (L) 05/12/2019 1215   GFRAA 26 (L) 05/31/2019 0728   GFRAA 30 (L) 05/12/2019 1215    Lab Results  Component Value Date   TOTALPROTELP 6.9 05/07/2019    No results found for: KPAFRELGTCHN, LAMBDASER, [facility]  Lab Results  Component Value Date   WBC 4.9 05/31/2019   NEUTROABS 3.4 05/31/2019   HGB 8.8 (L) 05/31/2019   HCT 28.2 (L) 05/31/2019   MCV 94.6 05/31/2019   PLT 216 05/31/2019    @  LASTCHEMISTRY@  No results found for: LABCA2  No components found for: NLGXQJ194  Recent Labs    Lab 06/04/19 1144  INR 1.7*    No results found for: LABCA2  No results found for: RDE081  No results found for: KGY185  No results found for: UDJ497  No results found for: CA2729  No components found for: HGQUANT  No results found for: CEA1 / No results found for: CEA1   No results found for: AFPTUMOR  No results found for: CHROMOGRNA  No results found for: PSA1  Anti-coag visit on 06/04/2019  Component Date Value Ref Range Status   INR 06/04/2019 1.7* 2.0 - 3.0 Final    (this displays the last labs from the last 3 days)  Lab Results  Component Value Date   TOTALPROTELP 6.9 05/07/2019   (this displays SPEP labs)  No results found for: KPAFRELGTCHN, LAMBDASER, KAPLAMBRATIO (kappa/lambda light chains)  No results found for: HGBA, HGBA2QUANT, HGBFQUANT, HGBSQUAN (Hemoglobinopathy evaluation)   Lab Results  Component Value Date   LDH 197 (H) 02/27/2019    Lab Results  Component Value Date   IRON 51 05/26/2019   TIBC 239 (L) 05/26/2019   IRONPCTSAT 21 05/26/2019   (Iron and TIBC)  Lab Results  Component Value Date   FERRITIN 178 05/26/2019    Urinalysis    Component Value Date/Time   COLORURINE YELLOW 05/26/2019 [place] 05/26/2019 1646   LABSPEC 1.006 05/26/2019 1646   PHURINE 6.0 05/26/2019 1646   GLUCOSEU NEGATIVE 05/26/2019 1646   HGBUR LARGE (A) 05/26/2019 1646   BILIRUBINUR NEGATIVE 05/26/2019 [place] 05/26/2019 1646   PROTEINUR 100 (A) 05/26/2019 1646   UROBILINOGEN 1.0 10/06/2011 1709   NITRITE NEGATIVE 05/26/2019 1646   LEUKOCYTESUR MODERATE (A) 05/26/2019 1646     STUDIES:  Nm Cardiac Amyloid Tumor Loc Inflam Spect 1 Day  Result Date: 06/03/2019 Qualitative myocardial uptake (visual score) is grade 1 (less than bone). Quantitative heart to contralateral ratio is 1.2. Low suspicion for cardiac amyloidosis.  Dg Chest Port 1 View  Result Date: 05/28/2019 CLINICAL DATA:  Pulmonary edema EXAM:  PORTABLE CHEST 1 VIEW COMPARISON:  05/26/2019 FINDINGS: Diffuse bilateral interstitial and alveolar airspace opacities similar in appearance to the prior exam. Calcified right upper lobe pulmonary nodule and left lower lobe pulmonary nodule consistent with sequela prior granulomatous disease. Trace bilateral pleural effusions. Hyperinflated bilateral lungs as can be seen with COPD. No pneumothorax. Stable cardiomegaly. Prior CABG. No acute osseous abnormality. IMPRESSION: Findings most concerning for persistent pulmonary edema. Electronically Signed   By: Kathreen Devoid   On: 05/28/2019 06:55   Dg Chest Portable 1 View  Result Date: 05/26/2019 CLINICAL DATA:  Weakness and shortness of breath since last night. EXAM: PORTABLE CHEST 1 VIEW COMPARISON:  05/08/2019. FINDINGS: Stable enlarged cardiac silhouette and post CABG changes. Increased diffuse prominence of the interstitial markings, especially on the right, with probable mild interspersed airspace opacities. Kerley lines are noted at the right lung base. No pleural fluid is seen. Bilateral calcified granulomata are again noted. Also noted are coarse left breast calcifications compatible with degenerated fibroadenomata. Diffuse osteopenia. Mild to moderate left and minimal right glenohumeral joint degenerative changes. IMPRESSION: 1. Worsening pulmonary edema or pneumonia/pneumonitis. 2. Stable cardiomegaly. Electronically Signed   By: Claudie Revering M.D.   On: 05/26/2019 09:01   Dg Chest Portable 1 View  Result Date: 05/08/2019 CLINICAL DATA:  Fever EXAM: PORTABLE CHEST 1 VIEW COMPARISON:  04/04/2019, 02/28/2019  FINDINGS: Post sternotomy changes. Cardiomegaly with vascular congestion. Diffuse bilateral interstitial and ground-glass opacity, possible edema. More confluent opacity at the right base may reflect superimposed pneumonia. No pneumothorax. IMPRESSION: 1. Cardiomegaly with vascular congestion and diffuse bilateral interstitial and ground-glass opacity  suggesting pulmonary edema. 2. More confluent airspace disease at the right base, could reflect superimposed pneumonia Electronically Signed   By: Donavan Foil M.D.   On: 05/08/2019 02:25     ELIGIBLE FOR AVAILABLE RESEARCH PROTOCOL: no   ASSESSMENT: 78 y.o. [place] woman with multifactorial anemia, as follows:             (a) anemia of chronic inflammation: As of May 2020 she has a SED rate of 95, CRP 1.1, positive ANA and RF; subsequently she was found to be ANCA positive             (b) hemolysis (mild): she has a mildly elevated LDH and a DAT positive for complement, not IgG; this is c/w (a) above             (c) anemia of renal insufficiency: inadequate EPO resonse to anemia              (d) GIB/ iron deficiency in patient on lifelong anticoagulaton  (1) Feraheme received 03/01/2019, repeated 03/20/2019   (a) reticulocyte 03/07/2019 up from 43.4 a year ago to 191.8 after iron infusion  (b) feraheme on 05/19/2019  (2) darbepoetin: starting 05/05/2019 at 15mcg given every 2 weeks.    (3) ANCA positive vasculitis: diagnosed 05/08/2019 (titer 1:640)  (a) prednisone 40 mg daily started 05/31/2019  (b) rituximab weekly started   (i) hepatitis B sAg negative  (4) osteopenia with a T score of -1.5 on bone density 02/28/2016 at the [facility] center    PLAN: [name] is tolerating her higher doses of prednisone well.  She is not checking her sugars.  We did check them in the hospital and they did not increase.  I have asked her to check it once a day, going from before breakfast, before lunch, before supper, and at bedtime, write down the results and bring them so we can discuss them next week.  She will receive her first dose of rituximab today.  We are only doing 100 mg so we can go really slowly.  Hopefully she will be able to tolerated well and in that case she will receive full dose next week.  The plan will be to proceed to 6-8 doses of rituximab try to get her off steroids and then  continue rituximab maintenance as tolerated.  She now has an excellent reticulocyte count greater than 100 and her hemoglobin continues to rise currently at 8.8.  She knows to call for any other issue that may develop before the next visit.   Brice Kossman, [name], MD  06/05/19 9:28 AM Medical Oncology and Hematology [facility] [address] [place],  [address] Tel. [phone]    Fax. [phone]   I, Wilburn Mylar, am acting as scribe for Dr. [name]. Cathlyn Tersigni.  I, Lurline Del MD, have reviewed the above documentation for accuracy and completeness, and I agree with the above.

## 2019-06-05 ENCOUNTER — Inpatient Hospital Stay: Payer: Medicare HMO

## 2019-06-05 ENCOUNTER — Inpatient Hospital Stay (HOSPITAL_BASED_OUTPATIENT_CLINIC_OR_DEPARTMENT_OTHER): Payer: Medicare HMO | Admitting: Oncology

## 2019-06-05 ENCOUNTER — Telehealth: Payer: Self-pay | Admitting: *Deleted

## 2019-06-05 ENCOUNTER — Other Ambulatory Visit: Payer: Self-pay

## 2019-06-05 VITALS — BP 133/42 | HR 47 | Temp 97.9°F | Resp 17

## 2019-06-05 VITALS — BP 132/37 | HR 49 | Temp 98.5°F | Resp 18 | Ht 62.0 in | Wt 121.5 lb

## 2019-06-05 DIAGNOSIS — Z5112 Encounter for antineoplastic immunotherapy: Secondary | ICD-10-CM | POA: Diagnosis not present

## 2019-06-05 DIAGNOSIS — I2510 Atherosclerotic heart disease of native coronary artery without angina pectoris: Secondary | ICD-10-CM | POA: Diagnosis not present

## 2019-06-05 DIAGNOSIS — I776 Arteritis, unspecified: Secondary | ICD-10-CM | POA: Diagnosis not present

## 2019-06-05 DIAGNOSIS — I7782 Antineutrophilic cytoplasmic antibody (ANCA) vasculitis: Secondary | ICD-10-CM

## 2019-06-05 DIAGNOSIS — Z87440 Personal history of urinary (tract) infections: Secondary | ICD-10-CM | POA: Diagnosis not present

## 2019-06-05 DIAGNOSIS — D61818 Other pancytopenia: Secondary | ICD-10-CM

## 2019-06-05 DIAGNOSIS — I480 Paroxysmal atrial fibrillation: Secondary | ICD-10-CM | POA: Diagnosis not present

## 2019-06-05 DIAGNOSIS — I739 Peripheral vascular disease, unspecified: Secondary | ICD-10-CM

## 2019-06-05 DIAGNOSIS — I4891 Unspecified atrial fibrillation: Secondary | ICD-10-CM | POA: Diagnosis not present

## 2019-06-05 DIAGNOSIS — M8580 Other specified disorders of bone density and structure, unspecified site: Secondary | ICD-10-CM | POA: Diagnosis not present

## 2019-06-05 DIAGNOSIS — N189 Chronic kidney disease, unspecified: Secondary | ICD-10-CM | POA: Diagnosis not present

## 2019-06-05 DIAGNOSIS — D631 Anemia in chronic kidney disease: Secondary | ICD-10-CM | POA: Diagnosis not present

## 2019-06-05 DIAGNOSIS — N289 Disorder of kidney and ureter, unspecified: Secondary | ICD-10-CM

## 2019-06-05 DIAGNOSIS — I272 Pulmonary hypertension, unspecified: Secondary | ICD-10-CM

## 2019-06-05 DIAGNOSIS — R7989 Other specified abnormal findings of blood chemistry: Secondary | ICD-10-CM | POA: Diagnosis not present

## 2019-06-05 DIAGNOSIS — D508 Other iron deficiency anemias: Secondary | ICD-10-CM

## 2019-06-05 DIAGNOSIS — E785 Hyperlipidemia, unspecified: Secondary | ICD-10-CM | POA: Diagnosis not present

## 2019-06-05 DIAGNOSIS — D696 Thrombocytopenia, unspecified: Secondary | ICD-10-CM | POA: Diagnosis not present

## 2019-06-05 DIAGNOSIS — I119 Hypertensive heart disease without heart failure: Secondary | ICD-10-CM | POA: Diagnosis not present

## 2019-06-05 DIAGNOSIS — Z7901 Long term (current) use of anticoagulants: Secondary | ICD-10-CM | POA: Diagnosis not present

## 2019-06-05 DIAGNOSIS — K648 Other hemorrhoids: Secondary | ICD-10-CM | POA: Diagnosis not present

## 2019-06-05 DIAGNOSIS — D594 Other nonautoimmune hemolytic anemias: Secondary | ICD-10-CM

## 2019-06-05 DIAGNOSIS — K5730 Diverticulosis of large intestine without perforation or abscess without bleeding: Secondary | ICD-10-CM | POA: Diagnosis not present

## 2019-06-05 DIAGNOSIS — E7800 Pure hypercholesterolemia, unspecified: Secondary | ICD-10-CM | POA: Diagnosis not present

## 2019-06-05 LAB — CBC WITH DIFFERENTIAL/PLATELET
Abs Immature Granulocytes: 0.04 10*3/uL (ref 0.00–0.07)
Basophils Absolute: 0 10*3/uL (ref 0.0–0.1)
Basophils Relative: 0 %
Eosinophils Absolute: 0.1 10*3/uL (ref 0.0–0.5)
Eosinophils Relative: 1 %
HCT: 27 % — ABNORMAL LOW (ref 36.0–46.0)
Hemoglobin: 8.8 g/dL — ABNORMAL LOW (ref 12.0–15.0)
Immature Granulocytes: 1 %
Lymphocytes Relative: 6 %
Lymphs Abs: 0.4 10*3/uL — ABNORMAL LOW (ref 0.7–4.0)
MCH: 29.8 pg (ref 26.0–34.0)
MCHC: 32.6 g/dL (ref 30.0–36.0)
MCV: 91.5 fL (ref 80.0–100.0)
Monocytes Absolute: 0.2 10*3/uL (ref 0.1–1.0)
Monocytes Relative: 4 %
Neutro Abs: 5.9 10*3/uL (ref 1.7–7.7)
Neutrophils Relative %: 88 %
Platelets: 130 10*3/uL — ABNORMAL LOW (ref 150–400)
RBC: 2.95 MIL/uL — ABNORMAL LOW (ref 3.87–5.11)
RDW: 19.6 % — ABNORMAL HIGH (ref 11.5–15.5)
WBC: 6.6 10*3/uL (ref 4.0–10.5)
nRBC: 0 % (ref 0.0–0.2)

## 2019-06-05 LAB — CMP (CANCER CENTER ONLY)
ALT: 14 U/L (ref 0–44)
AST: 23 U/L (ref 15–41)
Albumin: 2.6 g/dL — ABNORMAL LOW (ref 3.5–5.0)
Alkaline Phosphatase: 58 U/L (ref 38–126)
Anion gap: 7 (ref 5–15)
BUN: 82 mg/dL — ABNORMAL HIGH (ref 8–23)
CO2: 17 mmol/L — ABNORMAL LOW (ref 22–32)
Calcium: 8 mg/dL — ABNORMAL LOW (ref 8.9–10.3)
Chloride: 104 mmol/L (ref 98–111)
Creatinine: 1.94 mg/dL — ABNORMAL HIGH (ref 0.44–1.00)
GFR, Est AFR Am: 28 mL/min — ABNORMAL LOW (ref >60)
GFR, Estimated: 24 mL/min — ABNORMAL LOW (ref >60)
Glucose, Bld: 189 mg/dL — ABNORMAL HIGH (ref 70–99)
Potassium: 4.1 mmol/L (ref 3.5–5.1)
Sodium: 128 mmol/L — ABNORMAL LOW (ref 135–145)
Total Bilirubin: 0.6 mg/dL (ref 0.3–1.2)
Total Protein: 5.5 g/dL — ABNORMAL LOW (ref 6.5–8.1)

## 2019-06-05 LAB — RETICULOCYTES
Immature Retic Fract: 4.5 % (ref 2.3–15.9)
RBC.: 2.94 MIL/uL — ABNORMAL LOW (ref 3.87–5.11)
Retic Count, Absolute: 110.5 10*3/uL (ref 19.0–186.0)
Retic Ct Pct: 3.8 % — ABNORMAL HIGH (ref 0.4–3.1)

## 2019-06-05 LAB — IRON AND TIBC
Iron: 49 ug/dL (ref 41–142)
Saturation Ratios: 22 % (ref 21–57)
TIBC: 223 ug/dL — ABNORMAL LOW (ref 236–444)
UIBC: 174 ug/dL (ref 120–384)

## 2019-06-05 LAB — SAVE SMEAR(SSMR), FOR PROVIDER SLIDE REVIEW

## 2019-06-05 LAB — FERRITIN: Ferritin: 275 ng/mL (ref 11–307)

## 2019-06-05 MED ORDER — DIPHENHYDRAMINE HCL 25 MG PO CAPS
ORAL_CAPSULE | ORAL | Status: AC
  Filled 2019-06-05: qty 1

## 2019-06-05 MED ORDER — ACETAMINOPHEN 325 MG PO TABS
ORAL_TABLET | ORAL | Status: AC
  Filled 2019-06-05: qty 2

## 2019-06-05 MED ORDER — DIPHENHYDRAMINE HCL 25 MG PO CAPS
25.0000 mg | ORAL_CAPSULE | Freq: Once | ORAL | Status: AC
  Administered 2019-06-05: 11:00:00 25 mg via ORAL

## 2019-06-05 MED ORDER — SODIUM CHLORIDE 0.9 % IV SOLN
Freq: Once | INTRAVENOUS | Status: AC
  Administered 2019-06-05: 11:00:00 via INTRAVENOUS
  Filled 2019-06-05: qty 250

## 2019-06-05 MED ORDER — ACETAMINOPHEN 325 MG PO TABS
650.0000 mg | ORAL_TABLET | Freq: Once | ORAL | Status: AC
  Administered 2019-06-05: 650 mg via ORAL

## 2019-06-05 MED ORDER — SODIUM CHLORIDE 0.9 % IV SOLN
100.0000 mg | Freq: Once | INTRAVENOUS | Status: AC
  Administered 2019-06-05: 100 mg via INTRAVENOUS
  Filled 2019-06-05: qty 10

## 2019-06-05 NOTE — Telephone Encounter (Signed)
Per MD pt does not need IV iron today. Pt should receive Rituxin only. Treatment room nurse notified.

## 2019-06-05 NOTE — Patient Instructions (Signed)
[place] Discharge Instructions for Patients Receiving Chemotherapy  Today you received the following chemotherapy agents: Rituximab (Rituxan)  To help prevent nausea and vomiting after your treatment, we encourage you to take your nausea medication as directed.   If you develop nausea and vomiting that is not controlled by your nausea medication, call the clinic.   BELOW ARE SYMPTOMS THAT SHOULD BE REPORTED IMMEDIATELY:  *FEVER GREATER THAN 100.5 F  *CHILLS WITH OR WITHOUT FEVER  NAUSEA AND VOMITING THAT IS NOT CONTROLLED WITH YOUR NAUSEA MEDICATION  *UNUSUAL SHORTNESS OF BREATH  *UNUSUAL BRUISING OR BLEEDING  TENDERNESS IN MOUTH AND THROAT WITH OR WITHOUT PRESENCE OF ULCERS  *URINARY PROBLEMS  *BOWEL PROBLEMS  UNUSUAL RASH Items with * indicate a potential emergency and should be followed up as soon as possible.  Feel free to call the clinic should you have any questions or concerns. The clinic phone number is [phone].  Please show the [place] at check-in to the Emergency Department and triage nurse.  Rituximab injection What is this medicine? RITUXIMAB (ri TUX i mab) is a monoclonal antibody. It is used to treat certain types of cancer like non-Hodgkin lymphoma and chronic lymphocytic leukemia. It is also used to treat rheumatoid arthritis, granulomatosis with polyangiitis (or Wegener's granulomatosis), microscopic polyangiitis, and pemphigus vulgaris. This medicine may be used for other purposes; ask your health care provider or pharmacist if you have questions. COMMON BRAND NAME(S): Rituxan, RUXIENCE What should I tell my health care provider before I take this medicine? They need to know if you have any of these conditions:  heart disease  infection (especially a virus infection such as hepatitis B, chickenpox, cold sores, or herpes)  immune system problems  irregular heartbeat  kidney disease  low blood counts, like low white  cell, platelet, or red cell counts  lung or breathing disease, like asthma  recently received or scheduled to receive a vaccine  an unusual or allergic reaction to rituximab, other medicines, foods, dyes, or preservatives  pregnant or trying to get pregnant  breast-feeding How should I use this medicine? This medicine is for infusion into a vein. It is administered in a hospital or clinic by a specially trained health care professional. A special MedGuide will be given to you by the pharmacist with each prescription and refill. Be sure to read this information carefully each time. Talk to your pediatrician regarding the use of this medicine in children. This medicine is not approved for use in children. Overdosage: If you think you have taken too much of this medicine contact a poison control center or emergency room at once. NOTE: This medicine is only for you. Do not share this medicine with others. What if I miss a dose? It is important not to miss a dose. Call your doctor or health care professional if you are unable to keep an appointment. What may interact with this medicine?  cisplatin  live virus vaccines This list may not describe all possible interactions. Give your health care provider a list of all the medicines, herbs, non-prescription drugs, or dietary supplements you use. Also tell them if you smoke, drink alcohol, or use illegal drugs. Some items may interact with your medicine. What should I watch for while using this medicine? Your condition will be monitored carefully while you are receiving this medicine. You may need blood work done while you are taking this medicine. This medicine can cause serious allergic reactions. To reduce your risk you  may need to take medicine before treatment with this medicine. Take your medicine as directed. In some patients, this medicine may cause a serious brain infection that may cause death. If you have any problems seeing, thinking,  speaking, walking, or standing, tell your healthcare professional right away. If you cannot reach your healthcare professional, urgently seek other source of medical care. Call your doctor or health care professional for advice if you get a fever, chills or sore throat, or other symptoms of a cold or flu. Do not treat yourself. This drug decreases your body's ability to fight infections. Try to avoid being around people who are sick. Do not become pregnant while taking this medicine or for at least 12 months after stopping it. Women should inform their doctor if they wish to become pregnant or think they might be pregnant. There is a potential for serious side effects to an unborn child. Talk to your health care professional or pharmacist for more information. Do not breast-feed an infant while taking this medicine or for at least 6 months after stopping it. What side effects may I notice from receiving this medicine? Side effects that you should report to your doctor or health care professional as soon as possible:  allergic reactions like skin rash, itching or hives; swelling of the face, lips, or tongue  breathing problems  chest pain  changes in vision  diarrhea  headache with fever, neck stiffness, sensitivity to light, nausea, or confusion  fast, irregular heartbeat  loss of memory  low blood counts - this medicine may decrease the number of white blood cells, red blood cells and platelets. You may be at increased risk for infections and bleeding.  mouth sores  problems with balance, talking, or walking  redness, blistering, peeling or loosening of the skin, including inside the mouth  signs of infection - fever or chills, cough, sore throat, pain or difficulty passing urine  signs and symptoms of kidney injury like trouble passing urine or change in the amount of urine  signs and symptoms of liver injury like dark yellow or brown urine; general ill feeling or flu-like  symptoms; light-colored stools; loss of appetite; nausea; right upper belly pain; unusually weak or tired; yellowing of the eyes or skin  signs and symptoms of low blood pressure like dizziness; feeling faint or lightheaded, falls; unusually weak or tired  stomach pain  swelling of the ankles, feet, hands  unusual bleeding or bruising  vomiting Side effects that usually do not require medical attention (report to your doctor or health care professional if they continue or are bothersome):  headache  joint pain  muscle cramps or muscle pain  nausea  tiredness This list may not describe all possible side effects. Call your doctor for medical advice about side effects. You may report side effects to FDA at 1-800-FDA-1088. Where should I keep my medicine? This drug is given in a hospital or clinic and will not be stored at home. NOTE: This sheet is a summary. It may not cover all possible information. If you have questions about this medicine, talk to your doctor, pharmacist, or health care provider.  2020 Elsevier/Gold Standard (2018-11-06 22:01:36)  Coronavirus (COVID-19) Are you at risk?  Are you at risk for the Coronavirus (COVID-19)?  To be considered HIGH RISK for Coronavirus (COVID-19), you have to meet the following criteria:  . Traveled to [place], [place], [place], [place] or [place]; or in the [place] to [place], [place], [place], or  [place]; and have fever, cough, and shortness of breath within the last 2 weeks of travel OR . Been in close contact with a person diagnosed with COVID-19 within the last 2 weeks and have fever, cough, and shortness of breath . IF YOU DO NOT MEET THESE CRITERIA, YOU ARE CONSIDERED LOW RISK FOR COVID-19.  What to do if you are HIGH RISK for COVID-19?  [name] If you are having a medical emergency, call 911. . Seek medical care right away. Before you go to a doctor's office, urgent care or emergency department, call ahead and tell  them about your recent travel, contact with someone diagnosed with COVID-19, and your symptoms. You should receive instructions from your physician's office regarding next steps of care.  . When you arrive at healthcare provider, tell the healthcare staff immediately you have returned from visiting [place], [place], [place], [place] or [place]; or traveled in the [place] to [place], [place], [place], or [place]; in the last two weeks or you have been in close contact with a person diagnosed with COVID-19 in the last 2 weeks.   . Tell the health care staff about your symptoms: fever, cough and shortness of breath. . After you have been seen by a medical provider, you will be either: o Tested for (COVID-19) and discharged home on quarantine except to seek medical care if symptoms worsen, and asked to  - Stay home and avoid contact with others until you get your results (4-5 days)  - Avoid travel on public transportation if possible (such as bus, train, or airplane) or o Sent to the Emergency Department by EMS for evaluation, COVID-19 testing, and possible admission depending on your condition and test results.  What to do if you are LOW RISK for COVID-19?  Reduce your risk of any infection by using the same precautions used for avoiding the common cold or flu:  [name] Wash your hands often with soap and warm water for at least 20 seconds.  If soap and water are not readily available, use an alcohol-based hand sanitizer with at least 60% alcohol.  . If coughing or sneezing, cover your mouth and nose by coughing or sneezing into the elbow areas of your shirt or coat, into a tissue or into your sleeve (not your hands). . Avoid shaking hands with others and consider head nods or verbal greetings only. . Avoid touching your eyes, nose, or mouth with unwashed hands.  . Avoid close contact with people who are sick. . Avoid places or events with large numbers of people in one location, like concerts or  sporting events. . Carefully consider travel plans you have or are making. . If you are planning any travel outside or inside the [place], visit the CDC's Travelers' Health webpage for the latest health notices. . If you have some symptoms but not all symptoms, continue to monitor at home and seek medical attention if your symptoms worsen. . If you are having a medical emergency, call 911.   [place] / e-Visit: [email or web address]         [facility]: [facility]: [phone]                   [facility]: [phone]

## 2019-06-06 ENCOUNTER — Encounter: Payer: Self-pay | Admitting: Pharmacist

## 2019-06-06 ENCOUNTER — Telehealth: Payer: Self-pay | Admitting: *Deleted

## 2019-06-06 ENCOUNTER — Other Ambulatory Visit: Payer: Self-pay | Admitting: Pharmacist

## 2019-06-06 ENCOUNTER — Telehealth (HOSPITAL_COMMUNITY): Payer: Self-pay

## 2019-06-06 DIAGNOSIS — I129 Hypertensive chronic kidney disease with stage 1 through stage 4 chronic kidney disease, or unspecified chronic kidney disease: Secondary | ICD-10-CM | POA: Diagnosis not present

## 2019-06-06 DIAGNOSIS — N184 Chronic kidney disease, stage 4 (severe): Secondary | ICD-10-CM | POA: Diagnosis not present

## 2019-06-06 DIAGNOSIS — I130 Hypertensive heart and chronic kidney disease with heart failure and stage 1 through stage 4 chronic kidney disease, or unspecified chronic kidney disease: Secondary | ICD-10-CM | POA: Diagnosis not present

## 2019-06-06 DIAGNOSIS — I5032 Chronic diastolic (congestive) heart failure: Secondary | ICD-10-CM | POA: Diagnosis not present

## 2019-06-06 DIAGNOSIS — I776 Arteritis, unspecified: Secondary | ICD-10-CM | POA: Diagnosis not present

## 2019-06-06 DIAGNOSIS — I739 Peripheral vascular disease, unspecified: Secondary | ICD-10-CM | POA: Diagnosis not present

## 2019-06-06 DIAGNOSIS — I2510 Atherosclerotic heart disease of native coronary artery without angina pectoris: Secondary | ICD-10-CM | POA: Diagnosis not present

## 2019-06-06 DIAGNOSIS — M6281 Muscle weakness (generalized): Secondary | ICD-10-CM | POA: Insufficient documentation

## 2019-06-06 DIAGNOSIS — F411 Generalized anxiety disorder: Secondary | ICD-10-CM | POA: Diagnosis not present

## 2019-06-06 DIAGNOSIS — E1122 Type 2 diabetes mellitus with diabetic chronic kidney disease: Secondary | ICD-10-CM | POA: Diagnosis not present

## 2019-06-06 DIAGNOSIS — J9601 Acute respiratory failure with hypoxia: Secondary | ICD-10-CM | POA: Diagnosis not present

## 2019-06-06 DIAGNOSIS — D61818 Other pancytopenia: Secondary | ICD-10-CM | POA: Diagnosis not present

## 2019-06-06 DIAGNOSIS — Z952 Presence of prosthetic heart valve: Secondary | ICD-10-CM | POA: Diagnosis not present

## 2019-06-06 DIAGNOSIS — A770 Spotted fever due to Rickettsia rickettsii: Secondary | ICD-10-CM | POA: Diagnosis not present

## 2019-06-06 DIAGNOSIS — E1151 Type 2 diabetes mellitus with diabetic peripheral angiopathy without gangrene: Secondary | ICD-10-CM | POA: Diagnosis not present

## 2019-06-06 DIAGNOSIS — E1159 Type 2 diabetes mellitus with other circulatory complications: Secondary | ICD-10-CM | POA: Diagnosis not present

## 2019-06-06 LAB — HEPATITIS B CORE ANTIBODY, IGM: Hep B C IgM: NEGATIVE

## 2019-06-06 NOTE — Patient Outreach (Signed)
[facility]) Care Management  [place]   06/06/2019  DOMONIC HISCOX [birth year] [id number]  Reason for referral: Medication Assistance, Medication Review, Medication Reconciliation Post Discharge  Referral source: [facility] RN Current insurance: [employer]  PMHx includes but not limited to:   78 y.o.femalewith medical history significant for mechanical aortic valve on warfarin, pulmonary hypertension, CHF, CAD, type 2 diabetes, HTN, HLD, GERD, Anxiety, CKD stage III, gout and Peripheral arterial disease. She was recently hospitalized for SOB secondary to anemia.  Outreach:  Successful telephone call with patient.  HIPAA identifiers verified.   Subjective:  Patient reported feeling tired today (which is her norm). She said she was not in any pain today. She uses a pill box which she reported filling herself without difficulty.   Objective: The ASCVD Risk score [name], et al., 2013) failed to calculate for the following reasons:   The 2013 ASCVD risk score is only valid for ages 26 to 15  Lab Results  Component Value Date   CREATININE 1.94 (H) 06/05/2019   CREATININE 2.02 (H) 05/31/2019   CREATININE 1.92 (H) 05/30/2019    Lab Results  Component Value Date   HGBA1C 4.6 (L) 02/24/2019    Lipid Panel     Component Value Date/Time   CHOL 89 02/06/2019 1119   TRIG 184 (H) 02/06/2019 1119   HDL 18 (L) 02/06/2019 1119   CHOLHDL 4.9 02/06/2019 1119   VLDL 37 02/06/2019 1119   LDLCALC 34 02/06/2019 1119    BP Readings from Last 3 Encounters:  06/05/19 (!) 133/42  06/05/19 (!) 132/37  05/31/19 (!) 143/54    Allergies  Allergen Reactions  . Flagyl [Metronidazole] Rash    ALL-OVER BODY RASH  . Coreg [Carvedilol] Other (See Comments)    Terrible cramping in the feet and had a lot of bowel movements, but not diarrhea  . Losartan Swelling    Patient doesn't recall site of swelling  . Verapamil Hives  . Zetia [Ezetimibe] Other (See Comments)   Reaction not recalled  . Zocor [Simvastatin - High Dose] Other (See Comments)    Reaction not recalled    Medications Reviewed Today    Reviewed by [name], [facility] (Pharmacist) on 06/06/19 at 1347  Med List Status: <None>  Medication Order Taking? Sig Documenting Provider Last Dose Status Informant  acetaminophen (TYLENOL) 325 MG tablet [id number] Yes Take 325 mg by mouth every 6 (six) hours as needed for mild pain.  [provider] Taking Active Self  allopurinol (ZYLOPRIM) 100 MG tablet [id number] Yes Take 100 mg by mouth daily. [provider] Taking Active Self  ALPRAZolam (XANAX) 0.5 MG tablet [id number] Yes Take 0.25 mg by mouth at bedtime.  [provider] Taking Active Self  atorvastatin (LIPITOR) 80 MG tablet [id number] Yes Take 1 tablet (80 mg total) by mouth daily at 6 PM. [name], MD Taking Active Self  Cholecalciferol (VITAMIN D3) 1000 UNITS CAPS [id number] Yes Take 1,000 Units by mouth daily with lunch.  [provider] Taking Active Self  cloNIDine (CATAPRES) 0.1 MG tablet [id number] Yes Take 1 tablet (0.1 mg total) by mouth 2 (two) times daily. Cherylann Ratel A, DO Taking Active   Cyanocobalamin (VITAMIN B12) 1000 MCG TBCR [id number] Yes Take 1,000 mcg by mouth daily with lunch.  [provider] Taking Active Self  diphenhydrAMINE-zinc acetate (BENADRYL) cream [id number] Yes Apply topically 3 (three) times daily as needed for itching. Little Ishikawa, MD Taking Active Self  feeding supplement, GLUCERNA SHAKE, ([place]) LIQD [id number] Yes Take 237 mLs by mouth 2 (two) times daily between meals. [name], MD Taking Active Self  furosemide (LASIX) 40 MG tablet [id number] Yes Take 1.5 tablets (60 mg total) by mouth daily. [name], MD Taking Active Self  hydrALAZINE (APRESOLINE) 100 MG tablet [id number] Yes TAKE 1 TABLET (100 MG TOTAL) BY MOUTH 3 (THREE) TIMES DAILY. [name], MD Taking Active Self  magnesium  oxide (MAG-OX) 400 (241.3 Mg) MG tablet [id number] Yes Take 1 tablet (400 mg total) by mouth daily. [name], MD Taking Active Self  metFORMIN (GLUCOPHAGE) 500 MG tablet [id number] Yes Take 500 mg by mouth 2 (two) times daily with a meal.  [provider] Taking Active Self  metoprolol tartrate (LOPRESSOR) 25 MG tablet [id number] Yes Take 0.5 tablets (12.5 mg total) by mouth 2 (two) times daily. [place] Donnamarie Poag, DO Taking Active Self  ondansetron (ZOFRAN ODT) 4 MG disintegrating tablet [id number] Yes Take 1 tablet (4 mg total) by mouth every 8 (eight) hours as needed for nausea or vomiting. [name], [name], PA-C Taking Active Self  pantoprazole (PROTONIX) 40 MG tablet [id number] Yes Take 40 mg by mouth 2 (two) times daily.  [provider] Taking Active Self           Med Note [name]   Wed Jun 20, 2017 10:33 AM)    predniSONE (DELTASONE) 20 MG tablet [id number] Yes Take 2 tablets (40 mg total) by mouth daily with breakfast. Cherylann Ratel A, DO Taking Active   spironolactone (ALDACTONE) 25 MG tablet [id number] Yes Take 25 mg by mouth daily. [provider] Taking Active Self  tadalafil, PAH, (ADCIRCA) 20 MG tablet [id number] Yes Take 2 tablets (40 mg total) by mouth daily. [name], MD Taking Active Self  vitamin C (ASCORBIC ACID) 500 MG tablet [id number] Yes Take 500 mg by mouth daily with lunch. [provider] Taking Active Self  warfarin (COUMADIN) 5 MG tablet [id number] Yes Take 5-7.5 mg by mouth daily. Take 1.5 tablets on Tues, Thursday, Saturday and Sunday. Take 1 tablet on Mondays, Wednesdays, and Fridays. [provider] Taking Active Self          ASSESSMENT: Date Discharged from Hospital: 05/31/19 Date Medication Reconciliation Performed: 06/06/2019  Medications:  New at Discharge: . Clonidine . Doxycycline (completed)  Adjustments at Discharge: . Prednisone  Discontinued at Discharge:   Irbesartan  Patient  was recently discharged from hospital and all medications have been reviewed.   Drugs sorted by system:  Neurologic/Psychologic: Alprazolam  Hematology: Prednisone, [given in PCP office-Aranesep (8/10), Feraheme (08/03) Rituximab( 8/27)]  Cardiovascular:  Atorvastatin, Clonidine, Hydralazine, Metoprolol, Spironolactone, Tadalafil, Furosemide, Warfarin  Gastrointestinal: Pantoprazole, Ondansetron, Probiotic  Endocrine: Metformin  Renal: Allopurinol  Pain: Acetaminophen  Vitamins/Minerals/Supplements: Cholecalciferol, Vitamin B12, Ferrous Sulfate, Magnesium Oxide, Vitamin C  Topical Diphenhydramine-Zinc  Medication Review Findings:  . Warfarin - INR 1.7 goal 2-3 (Saw Coumadin clinic on 06/04/2019 and had her dose adjusted). . HgA1c 4.6% (has anemia so not sure of the accuracy) on metformin last filled 04/16/19 for a 90 day supply) o On statin (atorvastatin 80 mg last filled 04/22/19 90 day supply)   Medication Assistance Findings:  No medication assistance needs identified  Plan: . Will route note to PCP.  [name] Will follow-up in 7-10 business days.  . Consider closing case.  [name], PharmD, [place] Clinical Pharmacist [phone]

## 2019-06-06 NOTE — Telephone Encounter (Signed)
-----   Message from [name], MD sent at 06/03/2019  1:07 PM EDT ----- Negative for transthyretin amyloidosis.

## 2019-06-06 NOTE — Telephone Encounter (Signed)
Pt aware of results of tumor localization study. Pt appreciative.

## 2019-06-06 NOTE — Telephone Encounter (Signed)
This RN spoke with pt per her VM stating issues with constipation " my last last BM was Tuesday and that is not like me "  Per inquiry pt verified she is passing some gas, is not nauseated, eating well including " increasing my vegetables and fruits "  She asked if it could be the steroids?  This RN discussed above but also asked if pt was taking iron tablets at home due to her known anemia.  Mrs Eberle stated "yes I am taking some iron tablets "  This RN discussed major side effect of iron tablets is constipation and requested her to not take them anymore- if she needs iron we will replace it in this office by IV infusion.  This RN also discussed use of Colace to soften the stool and Miralax for a laxative effect. Of note pt has used Miralax " yes I am familiar with it"  This RN did caution the pt to not use any thing rectally to stimulate bowels due to current labs and therapy and possible injury to anal tissues.  Plan is for pt to institute use of Colace and Miralax and stop iron tablets.

## 2019-06-09 ENCOUNTER — Telehealth: Payer: Self-pay | Admitting: Oncology

## 2019-06-09 NOTE — Telephone Encounter (Signed)
I talk with patient regarding schedule  

## 2019-06-10 ENCOUNTER — Other Ambulatory Visit: Payer: Self-pay | Admitting: *Deleted

## 2019-06-10 MED ORDER — PREDNISONE 20 MG PO TABS: 40.0000 mg | ORAL_TABLET | Freq: Every day | ORAL | 0 refills | Status: AC

## 2019-06-11 ENCOUNTER — Other Ambulatory Visit: Payer: Self-pay

## 2019-06-11 ENCOUNTER — Telehealth: Payer: Self-pay

## 2019-06-11 ENCOUNTER — Other Ambulatory Visit (HOSPITAL_COMMUNITY): Payer: Self-pay | Admitting: Cardiology

## 2019-06-11 ENCOUNTER — Ambulatory Visit (INDEPENDENT_AMBULATORY_CARE_PROVIDER_SITE_OTHER): Payer: Medicare HMO | Admitting: Pharmacist Clinician (PhC)/ Clinical Pharmacy Specialist

## 2019-06-11 DIAGNOSIS — I4891 Unspecified atrial fibrillation: Secondary | ICD-10-CM | POA: Diagnosis not present

## 2019-06-11 DIAGNOSIS — Z7901 Long term (current) use of anticoagulants: Secondary | ICD-10-CM

## 2019-06-11 DIAGNOSIS — Z952 Presence of prosthetic heart valve: Secondary | ICD-10-CM

## 2019-06-11 LAB — POCT INR: INR: 2 (ref 2.0–3.0)

## 2019-06-11 NOTE — Telephone Encounter (Signed)
Called lmomed the pt to remind the pt that they have missed their appt and we need to get them back on the schedule.

## 2019-06-12 ENCOUNTER — Inpatient Hospital Stay: Payer: Medicare HMO

## 2019-06-12 ENCOUNTER — Other Ambulatory Visit: Payer: Self-pay

## 2019-06-12 ENCOUNTER — Inpatient Hospital Stay: Payer: Medicare HMO | Attending: Oncology

## 2019-06-12 ENCOUNTER — Inpatient Hospital Stay (HOSPITAL_BASED_OUTPATIENT_CLINIC_OR_DEPARTMENT_OTHER): Payer: Medicare HMO | Admitting: Physician Assistant

## 2019-06-12 VITALS — BP 154/56 | HR 52 | Temp 98.2°F | Resp 18

## 2019-06-12 VITALS — BP 158/61 | HR 50 | Temp 98.5°F | Resp 20 | Ht 62.0 in | Wt 124.8 lb

## 2019-06-12 DIAGNOSIS — I7782 Antineutrophilic cytoplasmic antibody (ANCA) vasculitis: Secondary | ICD-10-CM

## 2019-06-12 DIAGNOSIS — K648 Other hemorrhoids: Secondary | ICD-10-CM | POA: Diagnosis not present

## 2019-06-12 DIAGNOSIS — M8580 Other specified disorders of bone density and structure, unspecified site: Secondary | ICD-10-CM | POA: Insufficient documentation

## 2019-06-12 DIAGNOSIS — I2510 Atherosclerotic heart disease of native coronary artery without angina pectoris: Secondary | ICD-10-CM | POA: Diagnosis not present

## 2019-06-12 DIAGNOSIS — N183 Chronic kidney disease, stage 3 unspecified: Secondary | ICD-10-CM

## 2019-06-12 DIAGNOSIS — N390 Urinary tract infection, site not specified: Secondary | ICD-10-CM | POA: Insufficient documentation

## 2019-06-12 DIAGNOSIS — E1122 Type 2 diabetes mellitus with diabetic chronic kidney disease: Secondary | ICD-10-CM | POA: Insufficient documentation

## 2019-06-12 DIAGNOSIS — I10 Essential (primary) hypertension: Secondary | ICD-10-CM

## 2019-06-12 DIAGNOSIS — I4891 Unspecified atrial fibrillation: Secondary | ICD-10-CM | POA: Insufficient documentation

## 2019-06-12 DIAGNOSIS — Z5112 Encounter for antineoplastic immunotherapy: Secondary | ICD-10-CM | POA: Insufficient documentation

## 2019-06-12 DIAGNOSIS — D61818 Other pancytopenia: Secondary | ICD-10-CM | POA: Diagnosis not present

## 2019-06-12 DIAGNOSIS — N189 Chronic kidney disease, unspecified: Secondary | ICD-10-CM | POA: Insufficient documentation

## 2019-06-12 DIAGNOSIS — M7989 Other specified soft tissue disorders: Secondary | ICD-10-CM | POA: Insufficient documentation

## 2019-06-12 DIAGNOSIS — I119 Hypertensive heart disease without heart failure: Secondary | ICD-10-CM | POA: Diagnosis not present

## 2019-06-12 DIAGNOSIS — K5730 Diverticulosis of large intestine without perforation or abscess without bleeding: Secondary | ICD-10-CM | POA: Diagnosis not present

## 2019-06-12 DIAGNOSIS — I272 Pulmonary hypertension, unspecified: Secondary | ICD-10-CM | POA: Insufficient documentation

## 2019-06-12 DIAGNOSIS — D631 Anemia in chronic kidney disease: Secondary | ICD-10-CM | POA: Insufficient documentation

## 2019-06-12 DIAGNOSIS — I776 Arteritis, unspecified: Secondary | ICD-10-CM

## 2019-06-12 DIAGNOSIS — D696 Thrombocytopenia, unspecified: Secondary | ICD-10-CM

## 2019-06-12 DIAGNOSIS — I739 Peripheral vascular disease, unspecified: Secondary | ICD-10-CM

## 2019-06-12 DIAGNOSIS — Z7901 Long term (current) use of anticoagulants: Secondary | ICD-10-CM | POA: Insufficient documentation

## 2019-06-12 DIAGNOSIS — D649 Anemia, unspecified: Secondary | ICD-10-CM | POA: Diagnosis not present

## 2019-06-12 DIAGNOSIS — D508 Other iron deficiency anemias: Secondary | ICD-10-CM

## 2019-06-12 DIAGNOSIS — D594 Other nonautoimmune hemolytic anemias: Secondary | ICD-10-CM

## 2019-06-12 DIAGNOSIS — J811 Chronic pulmonary edema: Secondary | ICD-10-CM | POA: Insufficient documentation

## 2019-06-12 DIAGNOSIS — D500 Iron deficiency anemia secondary to blood loss (chronic): Secondary | ICD-10-CM

## 2019-06-12 LAB — CMP (CANCER CENTER ONLY)
ALT: 18 U/L (ref 0–44)
AST: 20 U/L (ref 15–41)
Albumin: 2.8 g/dL — ABNORMAL LOW (ref 3.5–5.0)
Alkaline Phosphatase: 60 U/L (ref 38–126)
Anion gap: 10 (ref 5–15)
BUN: 79 mg/dL — ABNORMAL HIGH (ref 8–23)
CO2: 18 mmol/L — ABNORMAL LOW (ref 22–32)
Calcium: 8.2 mg/dL — ABNORMAL LOW (ref 8.9–10.3)
Chloride: 102 mmol/L (ref 98–111)
Creatinine: 1.85 mg/dL — ABNORMAL HIGH (ref 0.44–1.00)
GFR, Est AFR Am: 29 mL/min — ABNORMAL LOW (ref >60)
GFR, Estimated: 25 mL/min — ABNORMAL LOW (ref >60)
Glucose, Bld: 188 mg/dL — ABNORMAL HIGH (ref 70–99)
Potassium: 4.2 mmol/L (ref 3.5–5.1)
Sodium: 130 mmol/L — ABNORMAL LOW (ref 135–145)
Total Bilirubin: 0.4 mg/dL (ref 0.3–1.2)
Total Protein: 5.5 g/dL — ABNORMAL LOW (ref 6.5–8.1)

## 2019-06-12 LAB — CBC WITH DIFFERENTIAL/PLATELET
Abs Immature Granulocytes: 0.03 10*3/uL (ref 0.00–0.07)
Basophils Absolute: 0 10*3/uL (ref 0.0–0.1)
Basophils Relative: 0 %
Eosinophils Absolute: 0 10*3/uL (ref 0.0–0.5)
Eosinophils Relative: 0 %
HCT: 29.2 % — ABNORMAL LOW (ref 36.0–46.0)
Hemoglobin: 9.4 g/dL — ABNORMAL LOW (ref 12.0–15.0)
Immature Granulocytes: 0 %
Lymphocytes Relative: 4 %
Lymphs Abs: 0.3 10*3/uL — ABNORMAL LOW (ref 0.7–4.0)
MCH: 29.7 pg (ref 26.0–34.0)
MCHC: 32.2 g/dL (ref 30.0–36.0)
MCV: 92.1 fL (ref 80.0–100.0)
Monocytes Absolute: 0.3 10*3/uL (ref 0.1–1.0)
Monocytes Relative: 4 %
Neutro Abs: 6.8 10*3/uL (ref 1.7–7.7)
Neutrophils Relative %: 92 %
Platelets: 135 10*3/uL — ABNORMAL LOW (ref 150–400)
RBC: 3.17 MIL/uL — ABNORMAL LOW (ref 3.87–5.11)
RDW: 18.2 % — ABNORMAL HIGH (ref 11.5–15.5)
WBC: 7.5 10*3/uL (ref 4.0–10.5)
nRBC: 0 % (ref 0.0–0.2)

## 2019-06-12 LAB — IRON AND TIBC
Iron: 63 ug/dL (ref 41–142)
Saturation Ratios: 28 % (ref 21–57)
TIBC: 229 ug/dL — ABNORMAL LOW (ref 236–444)
UIBC: 166 ug/dL (ref 120–384)

## 2019-06-12 LAB — RETICULOCYTES
Immature Retic Fract: 2.3 % (ref 2.3–15.9)
RBC.: 3.15 MIL/uL — ABNORMAL LOW (ref 3.87–5.11)
Retic Count, Absolute: 54.8 10*3/uL (ref 19.0–186.0)
Retic Ct Pct: 1.7 % (ref 0.4–3.1)

## 2019-06-12 LAB — SAVE SMEAR(SSMR), FOR PROVIDER SLIDE REVIEW

## 2019-06-12 LAB — FERRITIN: Ferritin: 322 ng/mL — ABNORMAL HIGH (ref 11–307)

## 2019-06-12 MED ORDER — ACETAMINOPHEN 325 MG PO TABS
650.0000 mg | ORAL_TABLET | Freq: Once | ORAL | Status: AC
  Administered 2019-06-12: 650 mg via ORAL

## 2019-06-12 MED ORDER — DARBEPOETIN ALFA 150 MCG/0.3ML IJ SOSY
150.0000 ug | PREFILLED_SYRINGE | Freq: Once | INTRAMUSCULAR | Status: AC
  Administered 2019-06-12: 150 ug via SUBCUTANEOUS
  Filled 2019-06-12: qty 0.3

## 2019-06-12 MED ORDER — SODIUM CHLORIDE 0.9 % IV SOLN
375.0000 mg/m2 | Freq: Once | INTRAVENOUS | Status: AC
  Administered 2019-06-12: 600 mg via INTRAVENOUS
  Filled 2019-06-12: qty 50

## 2019-06-12 MED ORDER — DIPHENHYDRAMINE HCL 25 MG PO CAPS
ORAL_CAPSULE | ORAL | Status: AC
  Filled 2019-06-12: qty 1

## 2019-06-12 MED ORDER — SODIUM CHLORIDE 0.9 % IV SOLN
Freq: Once | INTRAVENOUS | Status: AC
  Administered 2019-06-12: 11:00:00 via INTRAVENOUS
  Filled 2019-06-12: qty 250

## 2019-06-12 MED ORDER — DIPHENHYDRAMINE HCL 25 MG PO CAPS
25.0000 mg | ORAL_CAPSULE | Freq: Once | ORAL | Status: AC
  Administered 2019-06-12: 25 mg via ORAL

## 2019-06-12 MED ORDER — ACETAMINOPHEN 325 MG PO TABS
ORAL_TABLET | ORAL | Status: AC
  Filled 2019-06-12: qty 2

## 2019-06-12 NOTE — Progress Notes (Signed)
Verified with Dr. Jana Hakim that patient is to receive rituximab 375 mg/m2 today (06/12/19).   Leron Croak, PharmD PGY2 Hematology/Oncology Pharmacy Resident 06/12/2019 11:06 AM

## 2019-06-12 NOTE — Progress Notes (Signed)
[place]  [phone] Fax:([phone]) [phone]    ID: RENEISHA STILLEY DOB: January 04, 1941  MR#: [id number]  JYN#:[id number]  Patient Care Team: [name], MD as PCP - General (Internal Medicine) [name], MD as PCP - Advanced Heart Failure (Cardiology) Lorretta Harp, MD as PCP - Cardiology (Cardiology) [name], RN as [place] Management Magrinat, [name], MD as Consulting Physician (Hematology and Oncology) Arta Silence, MD as Consulting Physician (Gastroenterology) [name], MD as Consulting Physician (Cardiology) Marzetta Board, DPM as Consulting Physician (Podiatry) Gavin Pound, MD as Consulting Physician (Rheumatology) [name], [facility] as [place] Management (Pharmacist) OTHER MD:   CHIEF COMPLAINT: anemia and thrombocytopenia  CURRENT TREATMENT: [Feraheme], Aranesp, prednisone, rituximab  INTERVAL HISTORY: Sharesa returns today for follow up of her anemia and thrombocytopenia.  She continues on feraheme, her most recent treatment being 05/12/2019.  Her most recent ferritin on 06/05/2019 was 275.  She also continues on aranesp, with her most recent dose on 05/19/2019. She tolerates this well. She is due and will recieve Aranesp today.   She underwent SPECT testing for transthyretin amyloidosis on 06/03/2019, which was negative.  She is status post her first treatment with rituximab. She received a reduced dose of 100 mg with her first treatment because of concerns with tolerance. She tolerated it well without any adverse side effects. She will receive the full dose today as planned.   She was started on prednisone at 40 mg in the morning at her last hospitalization on 05/26/2019. We will begin to taper her dose.   REVIEW OF SYSTEMS: The patient is feeling well today. Her main complaint is fatigue, of which, is mildly improving.   Jun is tolerating the  prednisone well. She is taking 40 mg p.o. daily. The patient was instructed to bring a log of her blood sugar readings. She forgot to bring her recordings today but will bring them next week.  She reports some bilateral lower extremity swelling for which she is taking 60 mg of lasixs daily. This is managed by her cardiologist who is following her for PAD, CAD s/p CABG, mechanical AVR, pulmonary hypertension, and chronic diastolic CHF with prominent RV dysfunction.  The patient received her first treatment with rituxan last week and tolerated it well without any adverse side effects. She received a reduced dose of 100 mg of Rituxan last week for her first treatment due to concerns with tolerability. She denies any adverse side effects such as fever, rash, or bleeding or bruising. She denies any cough, chest pain, or shortness of breath.   She denies any bleeding or bruising. She denies melena, hematochezia, gingival bleeding, or epistaxis. All other review of systems otherwise noncontributory.    HISTORY OF CURRENT ILLNESS: From the original consult note:  DORAL [place]IGANGI [place] 78 [place]She has a past medical history significantforPAD s/p stenting; moderate to severe pulmonary HTN; afib and AVR on Coumadin; HTN; HLD; DM; and CAD s/p CABG.the patient was admitted to the hospital for anemia. She was having increasing fatigue and shortness of breath and had routine lab work drawn which showed a hemoglobin of 6.6. Due to her abnormal lab work, she was advised to come to the emergency room for admission. She was also admitted in February 2020 for symptomatic anemia. She reports she has been anemic for at least a year and has been taking oral iron. She underwent a colonoscopy and  EGD on 01/22/2018.The colonoscopy showed diverticulosis in the sigmoid colon and descending colon, internal hemorrhoids. The upper endoscopy was normal except for erythematous mucosa in  the antrum. She had a repeat EGD on 11/21/2018 that showed afew minimally oozing( with scope trauma) superficial gastric ulcers with pigmented material were found in the gastric body. The largest lesion was 4 mm in largest dimension.She underwent another upper endoscopy on 02/27/2019 which showed patchy mildly friable mucosa with contact bleeding was found in the gastric fundus, in the gastric body and in the gastric antrum.The patient is maintained on warfarin due to A. fib and a mechanical aortic valve. Review of the patient's chart shows that she has been anemic dating back to at least December thousand 12 (these are the earliest labs available to me). Her highest hemoglobin was 11.6 in December 2012. I also note that the patient has had intermittent mild leukopenia adding back to at least April 2019. Her lowest white blood cell count was 2.6 in April 2019.  Since admission, the patient has received 1 unit of packed red blood cells. She is currently on heparin for her procedure with plan to bridge back to Coumadin. Stool for occult blood was negative x1 on 02/24/2019. Hematology was asked see the patient to make recommendations regarding her anemia.  [name] subsequent history is as detailed below  MEDICAL HISTORY: Past Medical History:  Diagnosis Date   Aortic atherosclerosis ([place]) 01/23/2018   Atrial fibrillation (H[facility])    Carotid artery disease ([place])    Cholelithiasis 01/23/2018   Chronic anticoagulation    Coronary artery disease    status post coronary artery bypass grafting times 07/10/2004   Diabetes mellitus    Hypercholesteremia    Hypertension    Mechanical heart valve present    H. aortic valve replacement at the time of bypass surgery October 2005   Moderate to severe pulmonary hypertension ([place])    Peripheral arterial disease ([place])    history of left common iliac artery PTA and stenting for a chronic total occlusion 08/26/01    ALLERGIES:  is  allergic to flagyl [metronidazole]; coreg [carvedilol]; losartan; verapamil; zetia [ezetimibe]; and zocor [simvastatin - high dose].  MEDICATIONS:  Current Outpatient Medications  Medication Sig Dispense Refill   acetaminophen (TYLENOL) 325 MG tablet Take 325 mg by mouth every 6 (six) hours as needed for mild pain.      allopurinol (ZYLOPRIM) 100 MG tablet Take 100 mg by mouth daily.     ALPRAZolam (XANAX) 0.5 MG tablet Take 0.25 mg by mouth at bedtime.      atorvastatin (LIPITOR) 80 MG tablet Take 1 tablet (80 mg total) by mouth daily at 6 PM.     Cholecalciferol (VITAMIN D3) 1000 UNITS CAPS Take 1,000 Units by mouth daily with lunch.      cloNIDine (CATAPRES) 0.1 MG tablet Take 1 tablet (0.1 mg total) by mouth 2 (two) times daily. 60 tablet 1   Cyanocobalamin (VITAMIN B12) 1000 MCG TBCR Take 1,000 mcg by mouth daily with lunch.      diphenhydrAMINE-zinc acetate (BENADRYL) cream Apply topically 3 (three) times daily as needed for itching. 28.4 g 0   feeding supplement, GLUCERNA SHAKE, (GLUCERNA SHAKE) LIQD Take 237 mLs by mouth 2 (two) times daily between meals. 237 mL 30   furosemide (LASIX) 40 MG tablet Take 1.5 tablets (60 mg total) by mouth daily. 135 tablet 6   hydrALAZINE (APRESOLINE) 100 MG tablet TAKE 1 TABLET THREE TIMES DAILY 270 tablet 1  magnesium oxide (MAG-OX) 400 (241.3 Mg) MG tablet Take 1 tablet (400 mg total) by mouth daily.     metFORMIN (GLUCOPHAGE) 500 MG tablet Take 500 mg by mouth 2 (two) times daily with a meal.      metoprolol tartrate (LOPRESSOR) 25 MG tablet Take 0.5 tablets (12.5 mg total) by mouth 2 (two) times daily. 60 tablet 0   ondansetron (ZOFRAN ODT) 4 MG disintegrating tablet Take 1 tablet (4 mg total) by mouth every 8 (eight) hours as needed for nausea or vomiting. 6 tablet 0   pantoprazole (PROTONIX) 40 MG tablet Take 40 mg by mouth 2 (two) times daily.      predniSONE (DELTASONE) 20 MG tablet Take 2 tablets (40 mg total) by mouth daily  with breakfast. 180 tablet 0   spironolactone (ALDACTONE) 25 MG tablet Take 25 mg by mouth daily.     tadalafil, PAH, (ADCIRCA) 20 MG tablet Take 2 tablets (40 mg total) by mouth daily. 60 tablet 6   vitamin C (ASCORBIC ACID) 500 MG tablet Take 500 mg by mouth daily with lunch.     warfarin (COUMADIN) 5 MG tablet Take 5-7.5 mg by mouth daily. Take 1.5 tablets on Tues, Thursday, Saturday and Sunday. Take 1 tablet on Mondays, Wednesdays, and Fridays.     No current facility-administered medications for this visit.     SURGICAL HISTORY:  Past Surgical History:  Procedure Laterality Date   AORTIC VALVE REPLACEMENT     CARDIAC CATHETERIZATION  11/10/2004   40% right common illiac, 70% in stent restenosis of distal left common illiac,    CARDIAC CATHETERIZATION  05/18/2004   LAD 50-70% midstenosis, RCA dominant w/50% stenosis, 50% Right common Illiac artery ostial stenosis, 90% in stent restenosis within midportion of left common illiac stent   Carotid Duplex  03/12/2012   RSA-elev. velocities suggestive of a 50-69% diameter reduction, Right&Left Bulb/Prox ICA-mild-mod.fibrous plaqueelevating Velocities abnormal study.   CHOLECYSTECTOMY N/A 03/01/2018   Procedure: LAPAROSCOPIC CHOLECYSTECTOMY;  Surgeon: Judeth Horn, MD;  Location: [facility];  Service: General;  Laterality: N/A;   COLONOSCOPY WITH PROPOFOL N/A 01/22/2018   Procedure: COLONOSCOPY WITH PROPOFOL;  Surgeon: Wilford Corner, MD;  Location: [facility];  Service: Endoscopy;  Laterality: N/A;   ESOPHAGOGASTRODUODENOSCOPY (EGD) WITH PROPOFOL N/A 01/22/2018   Procedure: ESOPHAGOGASTRODUODENOSCOPY (EGD) WITH PROPOFOL;  Surgeon: Wilford Corner, MD;  Location: [facility];  Service: Endoscopy;  Laterality: N/A;   ESOPHAGOGASTRODUODENOSCOPY (EGD) WITH PROPOFOL N/A 11/21/2018   Procedure: ESOPHAGOGASTRODUODENOSCOPY (EGD) WITH PROPOFOL;  Surgeon: [name], MD;  Location: [facility];  Service: Gastroenterology;  Laterality: N/A;     ESOPHAGOGASTRODUODENOSCOPY (EGD) WITH PROPOFOL Left 02/27/2019   Procedure: ESOPHAGOGASTRODUODENOSCOPY (EGD) WITH PROPOFOL;  Surgeon: Arta Silence, MD;  Location: [facility] ENDOSCOPY;  Service: Endoscopy;  Laterality: Left;   GIVENS CAPSULE STUDY N/A 11/21/2018   Procedure: GIVENS CAPSULE STUDY;  Surgeon: [name], MD;  Location: [facility];  Service: Gastroenterology;  Laterality: N/A;  To be deployed during EGD   Lower Ext. Duplex  03/12/2012   Right Proximal CIA- vessel narrowing w/elevated velocities 0-49% diameter reduction. Right SFA-mild mixed density plaque throughout vessel.   NM MYOCAR PERF WALL MOTION  05/19/2010   protocol: Persantine, post stress EF 65%, negative for ischemia, low risk scan   RIGHT HEART CATH N/A 06/27/2018   Procedure: RIGHT HEART CATH;  Surgeon: [name], MD;  Location: [facility];  Service: Cardiovascular;  Laterality: N/A;   TRANSTHORACIC ECHOCARDIOGRAM  08/29/2012   Moderately calcified annulus of  mitral valve, moderate regurg. of both mitral valve and tricuspid valve.    FAMILY HISTORY:      Family History  Problem Relation Age of Onset   Breast cancer Neg Hx     GYNECOLOGIC HISTORY:  No LMP recorded. Patient has had a hysterectomy. Menarche:  years old Age at first live birth:  [place] P: 3 LMP:  Contraceptive:  HRT:  Hysterectomy?: yes BSO?:    SOCIAL HISTORY: (Current as of 03/07/2019) Tejasvi is widowed. She lives alone here in [place], [place]. She has 3 children that live locally. She smoked three quarters of a pack of cigarettes daily for 50 years. She quit in 2005. Denies alcohol use.              ADVANCED DIRECTIVES: Not in place   HEALTH MAINTENANCE: Social History        Tobacco Use   Smoking status: Former Smoker    Packs/day: 1.00    Years: 30.00    Pack years: 30.00    Types: Cigarettes   Smokeless tobacco: Never Used   Tobacco comment: quit smoking 2005  Substance  Use Topics   Alcohol use: No    Alcohol/week: 0.0 standard drinks   Drug use: No               Colonoscopy: EGD on 02/27/2019 under Dr. Paulita Fujita found some friable gastric mucosa.  Colonoscopy under Dr. Michail Sermon 01/22/2018 showed significant diverticular disease             PAP:              Bone density:  Mammography: 02/25/2017, breast density category B.  PHYSICAL EXAMINATION:  Blood pressure (!) 158/61, pulse (!) 50, temperature 98.5 F (36.9 C), temperature source Oral, resp. rate 20, height 5\' 2"  (1.575 m), weight 124 lb 12.8 oz (56.6 kg), SpO2 99 %.  ECOG PERFORMANCE STATUS: 2 - Symptomatic, <50% confined to bed  Sclerae unicteric, EOMs intact ENT: No thrush, lesions, or exudate. No cervical or supraclavicular adenopathy Lungs no rales or rhonchi Heart regular rate and rhythm. Click noted from mechanical valve.  Abd soft, nontender, positive bowel sounds MSK no focal spinal tenderness, no upper extremity lymphedema Neuro: nonfocal, well oriented, appropriate affect Breasts: Deferred  LABORATORY DATA: Lab Results  Component Value Date   WBC 7.5 06/12/2019   HGB 9.4 (L) 06/12/2019   HCT 29.2 (L) 06/12/2019   MCV 92.1 06/12/2019   PLT 135 (L) 06/12/2019      Chemistry      Component Value Date/Time   NA 130 (L) 06/12/2019 0853   NA 137 12/06/2017 1436   K 4.2 06/12/2019 0853   CL 102 06/12/2019 0853   CO2 18 (L) 06/12/2019 0853   BUN 79 (H) 06/12/2019 0853   BUN 20 12/06/2017 1436   CREATININE 1.85 (H) 06/12/2019 0853      Component Value Date/Time   CALCIUM 8.2 (L) 06/12/2019 0853   ALKPHOS 60 06/12/2019 0853   AST 20 06/12/2019 0853   ALT 18 06/12/2019 0853   BILITOT 0.4 06/12/2019 0853       RADIOGRAPHIC STUDIES:  Nm Cardiac Amyloid Tumor Loc Inflam Spect 1 Day  Result Date: 06/03/2019 Qualitative myocardial uptake (visual score) is grade 1 (less than bone). Quantitative heart to contralateral ratio is 1.2. Low suspicion for cardiac  amyloidosis.  Dg Chest Port 1 View  Result Date: 05/28/2019 CLINICAL DATA:  Pulmonary edema EXAM: PORTABLE CHEST 1 VIEW COMPARISON:  05/26/2019 FINDINGS:  Diffuse bilateral interstitial and alveolar airspace opacities similar in appearance to the prior exam. Calcified right upper lobe pulmonary nodule and left lower lobe pulmonary nodule consistent with sequela prior granulomatous disease. Trace bilateral pleural effusions. Hyperinflated bilateral lungs as can be seen with COPD. No pneumothorax. Stable cardiomegaly. Prior CABG. No acute osseous abnormality. IMPRESSION: Findings most concerning for persistent pulmonary edema. Electronically Signed   By: Kathreen Devoid   On: 05/28/2019 06:55   Dg Chest Portable 1 View  Result Date: 05/26/2019 CLINICAL DATA:  Weakness and shortness of breath since last night. EXAM: PORTABLE CHEST 1 VIEW COMPARISON:  05/08/2019. FINDINGS: Stable enlarged cardiac silhouette and post CABG changes. Increased diffuse prominence of the interstitial markings, especially on the right, with probable mild interspersed airspace opacities. Kerley lines are noted at the right lung base. No pleural fluid is seen. Bilateral calcified granulomata are again noted. Also noted are coarse left breast calcifications compatible with degenerated fibroadenomata. Diffuse osteopenia. Mild to moderate left and minimal right glenohumeral joint degenerative changes. IMPRESSION: 1. Worsening pulmonary edema or pneumonia/pneumonitis. 2. Stable cardiomegaly. Electronically Signed   By: Claudie Revering M.D.   On: 05/26/2019 09:01     ASSESSMENT/PLAN:  ELIGIBLE FOR AVAILABLE RESEARCH PROTOCOL: no   ASSESSMENT: 78 y.o. [place] woman with multifactorial anemia, as follows: (a) anemia of chronic inflammation: As of May 2020 she has a SED rate of 95, CRP 1.1, positive ANA and RF; subsequently she was found to be ANCA positive (b) hemolysis (mild): she has a mildly elevated LDH and a  DAT positive for complement, not IgG; this is c/w (a) above (c) anemia of renal insufficiency: inadequate EPO resonse to anemia             (d) GIB/ iron deficiency in patient on lifelong anticoagulaton  (1) Feraheme received 03/01/2019, repeated 03/20/2019              (a) reticulocyte 03/07/2019 up from 43.4 a year ago to 191.8 after iron infusion             (b) feraheme on 05/19/2019  (2) darbepoetin: starting 05/05/2019 at 155mcg given every 2 weeks.    (3) ANCA positive vasculitis: diagnosed 05/08/2019 (titer 1:640)             (a) prednisone 40 mg daily started 05/31/2019.    (I) begin tapering 30 mg daily starting 06/12/2019             (b) rituximab weekly started                         (i) hepatitis B sAg negative  (4) osteopenia with a T score of -1.5 on bone density 02/28/2016 at the breast center  PLAN: This is a very pleasant 78 year old Caucasian female who is being treated for anemia and thrombocytopenia.   Regarding her prednisone, the patient was instructed to begin tapering her dose to 30 mg p.o. daily. I will see the patient back next week on 06/20/2019. We will discuss further tapering instructions at that time. The patient has been monitoring her blood glucose at home. She has been keeping a log of her readings but has forgotten her records today. She will bring her readings to her next appointment to discuss.   The patient received her first treatment with rituxan last week at a reduced dose of 100 mg due to concerns with tolerability. She did tolerate her treatment well without any adverse side  effects. She will receive the full dose of 375 mg/m2 today as scheduled. Per Dr. [name] last note, the plan will be to proceed to 6-8 doses of rituximab try to get her off steroids and then continue rituximab maintenance as tolerated.  [name] hemoglobin continues to improve. Her hemoglobin is 9.4 today from 8.8 at her last appointment.    The patient is scheduled to receive Aranesp today and will receive it as planned.   Regarding the patient's bilateral lower extremity swelling, the patient was encouraged to use her compression stockings and to elevate her lower extremities. She will continue to take her lasixs as prescribed by her cardiologist. We will continue to monitor her swelling/weight closely.   The patient will continue to take her oral iron supplements as prescribed. The patient had been tolerating her oral iron well without any significant adverse effects. The patient will not receive feraheme today.  I will see the patient back for a follow up visit in 1 week for evaluation and repeat labs before starting cycle #3 of Rituxan.  The patient was advised to call immediately if she has any concerning symptoms in the interval. The patient voices understanding of current disease status and treatment options and is in agreement with the current care plan. All questions were answered. The patient knows to call the clinic with any problems, questions or concerns. We can certainly see the patient much sooner if necessary    No orders of the defined types were placed in this encounter.    Jayvian Escoe L Denea Cheaney, PA-C 06/12/19

## 2019-06-12 NOTE — Patient Instructions (Signed)
[facility] Discharge Instructions for Patients Receiving Chemotherapy  Today you received the following chemotherapy agents Rituximab (RITUXAN).  To help prevent nausea and vomiting after your treatment, we encourage you to take your nausea medication as prescribed.   If you develop nausea and vomiting that is not controlled by your nausea medication, call the clinic.   BELOW ARE SYMPTOMS THAT SHOULD BE REPORTED IMMEDIATELY:  *FEVER GREATER THAN 100.5 F  *CHILLS WITH OR WITHOUT FEVER  NAUSEA AND VOMITING THAT IS NOT CONTROLLED WITH YOUR NAUSEA MEDICATION  *UNUSUAL SHORTNESS OF BREATH  *UNUSUAL BRUISING OR BLEEDING  TENDERNESS IN MOUTH AND THROAT WITH OR WITHOUT PRESENCE OF ULCERS  *URINARY PROBLEMS  *BOWEL PROBLEMS  UNUSUAL RASH Items with * indicate a potential emergency and should be followed up as soon as possible.  Feel free to call the clinic should you have any questions or concerns. The clinic phone number is [phone].  Please show the [place] at check-in to the Emergency Department and triage nurse.  Darbepoetin Alfa injection What is this medicine? DARBEPOETIN ALFA (dar be POE e tin AL fa) helps your body make more red blood cells. It is used to treat anemia caused by chronic kidney failure and chemotherapy. This medicine may be used for other purposes; ask your health care provider or pharmacist if you have questions. COMMON BRAND NAME(S): Aranesp What should I tell my health care provider before I take this medicine? They need to know if you have any of these conditions:  blood clotting disorders or history of blood clots  cancer patient not on chemotherapy  cystic fibrosis  heart disease, such as angina, heart failure, or a history of a heart attack  hemoglobin level of 12 g/dL or greater  high blood pressure  low levels of folate, iron, or vitamin B12  seizures  an unusual or allergic reaction to darbepoetin,  erythropoietin, albumin, hamster proteins, latex, other medicines, foods, dyes, or preservatives  pregnant or trying to get pregnant  breast-feeding How should I use this medicine? This medicine is for injection into a vein or under the skin. It is usually given by a health care professional in a hospital or clinic setting. If you get this medicine at home, you will be taught how to prepare and give this medicine. Use exactly as directed. Take your medicine at regular intervals. Do not take your medicine more often than directed. It is important that you put your used needles and syringes in a special sharps container. Do not put them in a trash can. If you do not have a sharps container, call your pharmacist or healthcare provider to get one. A special MedGuide will be given to you by the pharmacist with each prescription and refill. Be sure to read this information carefully each time. Talk to your pediatrician regarding the use of this medicine in children. While this medicine may be used in children as young as 4 month of age for selected conditions, precautions do apply. Overdosage: If you think you have taken too much of this medicine contact a poison control center or emergency room at once. NOTE: This medicine is only for you. Do not share this medicine with others. What if I miss a dose? If you miss a dose, take it as soon as you can. If it is almost time for your next dose, take only that dose. Do not take double or extra doses. What may interact with this medicine? Do not take this  medicine with any of the following medications:  epoetin alfa This list may not describe all possible interactions. Give your health care provider a list of all the medicines, herbs, non-prescription drugs, or dietary supplements you use. Also tell them if you smoke, drink alcohol, or use illegal drugs. Some items may interact with your medicine. What should I watch for while using this medicine? Your  condition will be monitored carefully while you are receiving this medicine. You may need blood work done while you are taking this medicine. This medicine may cause a decrease in vitamin B6. You should make sure that you get enough vitamin B6 while you are taking this medicine. Discuss the foods you eat and the vitamins you take with your health care professional. What side effects may I notice from receiving this medicine? Side effects that you should report to your doctor or health care professional as soon as possible:  allergic reactions like skin rash, itching or hives, swelling of the face, lips, or tongue  breathing problems  changes in vision  chest pain  confusion, trouble speaking or understanding  feeling faint or lightheaded, falls  high blood pressure  muscle aches or pains  pain, swelling, warmth in the leg  rapid weight gain  severe headaches  sudden numbness or weakness of the face, arm or leg  trouble walking, dizziness, loss of balance or coordination  seizures (convulsions)  swelling of the ankles, feet, hands  unusually weak or tired Side effects that usually do not require medical attention (report to your doctor or health care professional if they continue or are bothersome):  diarrhea  fever, chills (flu-like symptoms)  headaches  nausea, vomiting  redness, stinging, or swelling at site where injected This list may not describe all possible side effects. Call your doctor for medical advice about side effects. You may report side effects to FDA at 1-800-FDA-1088. Where should I keep my medicine? Keep out of the reach of children. Store in a refrigerator between 2 and 8 degrees C (36 and 46 degrees F). Do not freeze. Do not shake. Throw away any unused portion if using a single-dose vial. Throw away any unused medicine after the expiration date. NOTE: This sheet is a summary. It may not cover all possible information. If you have questions  about this medicine, talk to your doctor, pharmacist, or health care provider.  2020 Elsevier/Gold Standard (2017-10-10 16:44:20)  Coronavirus (COVID-19) Are you at risk?  Are you at risk for the Coronavirus (COVID-19)?  To be considered HIGH RISK for Coronavirus (COVID-19), you have to meet the following criteria:  . Traveled to [place], [place], [place], [place] or [place]; or in the [place] to [place], [place], [place], or [place]; and have fever, cough, and shortness of breath within the last 2 weeks of travel OR . Been in close contact with a person diagnosed with COVID-19 within the last 2 weeks and have fever, cough, and shortness of breath . IF YOU DO NOT MEET THESE CRITERIA, YOU ARE CONSIDERED LOW RISK FOR COVID-19.  What to do if you are HIGH RISK for COVID-19?  [name] If you are having a medical emergency, call 911. . Seek medical care right away. Before you go to a doctor's office, urgent care or emergency department, call ahead and tell them about your recent travel, contact with someone diagnosed with COVID-19, and your symptoms. You should receive instructions from your physician's office regarding next steps of care.  . When you  arrive at healthcare provider, tell the healthcare staff immediately you have returned from visiting [place], [place], [place], [place] or [place]; or traveled in the [place] to [place], [place], [place], or [place]; in the last two weeks or you have been in close contact with a person diagnosed with COVID-19 in the last 2 weeks.   . Tell the health care staff about your symptoms: fever, cough and shortness of breath. . After you have been seen by a medical provider, you will be either: o Tested for (COVID-19) and discharged home on quarantine except to seek medical care if symptoms worsen, and asked to  - Stay home and avoid contact with others until you get your results (4-5 days)  - Avoid travel on public transportation if possible  (such as bus, train, or airplane) or o Sent to the Emergency Department by EMS for evaluation, COVID-19 testing, and possible admission depending on your condition and test results.  What to do if you are LOW RISK for COVID-19?  Reduce your risk of any infection by using the same precautions used for avoiding the common cold or flu:  [name] Wash your hands often with soap and warm water for at least 20 seconds.  If soap and water are not readily available, use an alcohol-based hand sanitizer with at least 60% alcohol.  . If coughing or sneezing, cover your mouth and nose by coughing or sneezing into the elbow areas of your shirt or coat, into a tissue or into your sleeve (not your hands). . Avoid shaking hands with others and consider head nods or verbal greetings only. . Avoid touching your eyes, nose, or mouth with unwashed hands.  . Avoid close contact with people who are sick. . Avoid places or events with large numbers of people in one location, like concerts or sporting events. . Carefully consider travel plans you have or are making. . If you are planning any travel outside or inside the [place], visit the CDC's Travelers' Health webpage for the latest health notices. . If you have some symptoms but not all symptoms, continue to monitor at home and seek medical attention if your symptoms worsen. . If you are having a medical emergency, call 911.   [place] / e-Visit: [email or web address]         [facility]: [facility]: [phone]                   [facility]: [phone]

## 2019-06-13 ENCOUNTER — Telehealth: Payer: Self-pay | Admitting: Physician Assistant

## 2019-06-13 DIAGNOSIS — M810 Age-related osteoporosis without current pathological fracture: Secondary | ICD-10-CM | POA: Diagnosis not present

## 2019-06-13 DIAGNOSIS — E7849 Other hyperlipidemia: Secondary | ICD-10-CM | POA: Diagnosis not present

## 2019-06-13 DIAGNOSIS — Z0000 Encounter for general adult medical examination without abnormal findings: Secondary | ICD-10-CM | POA: Insufficient documentation

## 2019-06-13 DIAGNOSIS — E1151 Type 2 diabetes mellitus with diabetic peripheral angiopathy without gangrene: Secondary | ICD-10-CM | POA: Diagnosis not present

## 2019-06-13 NOTE — Telephone Encounter (Signed)
Per 9/3 los already scheduled °

## 2019-06-17 ENCOUNTER — Ambulatory Visit: Payer: Self-pay | Admitting: Pharmacist

## 2019-06-17 ENCOUNTER — Other Ambulatory Visit: Payer: Self-pay | Admitting: Pharmacist

## 2019-06-17 DIAGNOSIS — I272 Pulmonary hypertension, unspecified: Secondary | ICD-10-CM | POA: Diagnosis not present

## 2019-06-17 DIAGNOSIS — R768 Other specified abnormal immunological findings in serum: Secondary | ICD-10-CM | POA: Diagnosis not present

## 2019-06-17 DIAGNOSIS — Z79899 Other long term (current) drug therapy: Secondary | ICD-10-CM | POA: Diagnosis not present

## 2019-06-17 DIAGNOSIS — M2550 Pain in unspecified joint: Secondary | ICD-10-CM | POA: Diagnosis not present

## 2019-06-17 DIAGNOSIS — M150 Primary generalized (osteo)arthritis: Secondary | ICD-10-CM | POA: Diagnosis not present

## 2019-06-17 DIAGNOSIS — D638 Anemia in other chronic diseases classified elsewhere: Secondary | ICD-10-CM | POA: Diagnosis not present

## 2019-06-17 DIAGNOSIS — D591 Other autoimmune hemolytic anemias: Secondary | ICD-10-CM | POA: Diagnosis not present

## 2019-06-17 DIAGNOSIS — M1009 Idiopathic gout, multiple sites: Secondary | ICD-10-CM | POA: Diagnosis not present

## 2019-06-17 DIAGNOSIS — J849 Interstitial pulmonary disease, unspecified: Secondary | ICD-10-CM | POA: Diagnosis not present

## 2019-06-17 NOTE — Patient Outreach (Signed)
[facility]) Care Management  06/17/2019  [name] 06-15-1941 [id number]  Patient was called to follow up. Unfortunately, she did not answer her phone. HIPAA compliant message was left on her voicemail.  Plan: Call patient back in 7-10 business days.   [name], PharmD, [place] Clinical Pharmacist [phone]

## 2019-06-18 ENCOUNTER — Telehealth: Payer: Self-pay | Admitting: Oncology

## 2019-06-18 ENCOUNTER — Other Ambulatory Visit: Payer: Self-pay | Admitting: *Deleted

## 2019-06-18 ENCOUNTER — Inpatient Hospital Stay: Payer: Medicare HMO | Admitting: Pulmonary Disease

## 2019-06-18 NOTE — Patient Outreach (Signed)
[facility]) Care Management  06/18/2019  TILLEY FAETH Jan 13, 1941 [id number]   Call placed to member to follow up on symptoms of anemia.  She report she is doing "really good."  Was a little weak yesterday but state today is much better.  She had most recent treatment of rituximab last week, another infusing this Friday.  Report she has between 4-6 more treatments before she will be complete  Denies any complications with the last couple weeks.  Have an appointment with PCP tomorrow, will discuss sleep aid.  Will follow up within the next 2 weeks.  [facility] CM Care Plan Problem One     Most Recent Value  Care Plan Problem One  Risk for hospitalization related to anemia as evidenced by recent hospital admission  Role Documenting the Problem One  Care Management [place] for Problem One  Active  [place] Term Goal   Member will not be readmitted to hospital within the next 31 days  [facility] Long Term Goal Start Date  06/02/19  [facility] for Problem One Long Term Goal  Reviewed infusion schedule with member, advised to contact MD and/or ED immediately with reactions  [facility] CM Short Term Goal #1   Member will contact rheumatology office for sooner appointment within the next week  [facility] CM Short Term Goal #1 Start Date  06/02/19  Interventions for Short Term Goal #1  sooner appointment not obtained, but still has scheduled appointment  [facility] CM Short Term Goal #4  Member will have plan for life alert/emergency alert system within the next 2 weeks  [facility] CM Short Term Goal #4 Start Date  06/02/19 [Date reset]  [facility] CM Short Term Goal #4 Met Date  -- [not met]  Inter[facility] for Short Term Goal #4  Discussed concerns for member's safety living alone.  Encouraged to reconsider staying with daughter     [name], RN, MSN [place] [facility] [phone]

## 2019-06-18 NOTE — Telephone Encounter (Signed)
FAXED OFFICE NOTES TO [facility] [phone]

## 2019-06-19 DIAGNOSIS — M359 Systemic involvement of connective tissue, unspecified: Secondary | ICD-10-CM | POA: Diagnosis not present

## 2019-06-19 DIAGNOSIS — Z1339 Encounter for screening examination for other mental health and behavioral disorders: Secondary | ICD-10-CM | POA: Diagnosis not present

## 2019-06-19 DIAGNOSIS — E1151 Type 2 diabetes mellitus with diabetic peripheral angiopathy without gangrene: Secondary | ICD-10-CM | POA: Diagnosis not present

## 2019-06-19 DIAGNOSIS — I130 Hypertensive heart and chronic kidney disease with heart failure and stage 1 through stage 4 chronic kidney disease, or unspecified chronic kidney disease: Secondary | ICD-10-CM | POA: Diagnosis not present

## 2019-06-19 DIAGNOSIS — D61818 Other pancytopenia: Secondary | ICD-10-CM | POA: Diagnosis not present

## 2019-06-19 DIAGNOSIS — I2510 Atherosclerotic heart disease of native coronary artery without angina pectoris: Secondary | ICD-10-CM | POA: Diagnosis not present

## 2019-06-19 DIAGNOSIS — Z0000 Encounter for general adult medical examination without abnormal findings: Secondary | ICD-10-CM | POA: Diagnosis not present

## 2019-06-19 DIAGNOSIS — D692 Other nonthrombocytopenic purpura: Secondary | ICD-10-CM | POA: Diagnosis not present

## 2019-06-19 DIAGNOSIS — I482 Chronic atrial fibrillation, unspecified: Secondary | ICD-10-CM | POA: Diagnosis not present

## 2019-06-19 DIAGNOSIS — N183 Chronic kidney disease, stage 3 (moderate): Secondary | ICD-10-CM | POA: Diagnosis not present

## 2019-06-19 DIAGNOSIS — E1159 Type 2 diabetes mellitus with other circulatory complications: Secondary | ICD-10-CM | POA: Diagnosis not present

## 2019-06-19 DIAGNOSIS — D6869 Other thrombophilia: Secondary | ICD-10-CM | POA: Diagnosis not present

## 2019-06-19 DIAGNOSIS — Z1331 Encounter for screening for depression: Secondary | ICD-10-CM | POA: Diagnosis not present

## 2019-06-19 DIAGNOSIS — E46 Unspecified protein-calorie malnutrition: Secondary | ICD-10-CM | POA: Diagnosis not present

## 2019-06-19 DIAGNOSIS — D6859 Other primary thrombophilia: Secondary | ICD-10-CM | POA: Insufficient documentation

## 2019-06-19 DIAGNOSIS — I5032 Chronic diastolic (congestive) heart failure: Secondary | ICD-10-CM | POA: Diagnosis not present

## 2019-06-20 ENCOUNTER — Inpatient Hospital Stay: Payer: Medicare HMO

## 2019-06-20 ENCOUNTER — Encounter: Payer: Self-pay | Admitting: Physician Assistant

## 2019-06-20 ENCOUNTER — Inpatient Hospital Stay (HOSPITAL_BASED_OUTPATIENT_CLINIC_OR_DEPARTMENT_OTHER): Payer: Medicare HMO | Admitting: Physician Assistant

## 2019-06-20 ENCOUNTER — Other Ambulatory Visit: Payer: Self-pay

## 2019-06-20 ENCOUNTER — Other Ambulatory Visit: Payer: Self-pay | Admitting: Oncology

## 2019-06-20 ENCOUNTER — Other Ambulatory Visit: Payer: Self-pay | Admitting: *Deleted

## 2019-06-20 VITALS — BP 156/44 | HR 59 | Temp 97.6°F | Resp 18

## 2019-06-20 VITALS — BP 144/59 | HR 53 | Temp 98.5°F | Resp 18 | Ht 62.0 in | Wt 126.9 lb

## 2019-06-20 DIAGNOSIS — I739 Peripheral vascular disease, unspecified: Secondary | ICD-10-CM

## 2019-06-20 DIAGNOSIS — M1009 Idiopathic gout, multiple sites: Secondary | ICD-10-CM | POA: Diagnosis not present

## 2019-06-20 DIAGNOSIS — N183 Chronic kidney disease, stage 3 unspecified: Secondary | ICD-10-CM

## 2019-06-20 DIAGNOSIS — K648 Other hemorrhoids: Secondary | ICD-10-CM | POA: Diagnosis not present

## 2019-06-20 DIAGNOSIS — I10 Essential (primary) hypertension: Secondary | ICD-10-CM

## 2019-06-20 DIAGNOSIS — I776 Arteritis, unspecified: Secondary | ICD-10-CM | POA: Diagnosis not present

## 2019-06-20 DIAGNOSIS — D696 Thrombocytopenia, unspecified: Secondary | ICD-10-CM

## 2019-06-20 DIAGNOSIS — D508 Other iron deficiency anemias: Secondary | ICD-10-CM

## 2019-06-20 DIAGNOSIS — M2550 Pain in unspecified joint: Secondary | ICD-10-CM | POA: Diagnosis not present

## 2019-06-20 DIAGNOSIS — D649 Anemia, unspecified: Secondary | ICD-10-CM | POA: Diagnosis not present

## 2019-06-20 DIAGNOSIS — D631 Anemia in chronic kidney disease: Secondary | ICD-10-CM | POA: Diagnosis not present

## 2019-06-20 DIAGNOSIS — M8580 Other specified disorders of bone density and structure, unspecified site: Secondary | ICD-10-CM | POA: Diagnosis not present

## 2019-06-20 DIAGNOSIS — Z5112 Encounter for antineoplastic immunotherapy: Secondary | ICD-10-CM | POA: Diagnosis not present

## 2019-06-20 DIAGNOSIS — D61818 Other pancytopenia: Secondary | ICD-10-CM

## 2019-06-20 DIAGNOSIS — D594 Other nonautoimmune hemolytic anemias: Secondary | ICD-10-CM

## 2019-06-20 DIAGNOSIS — I7782 Antineutrophilic cytoplasmic antibody (ANCA) vasculitis: Secondary | ICD-10-CM

## 2019-06-20 DIAGNOSIS — N189 Chronic kidney disease, unspecified: Secondary | ICD-10-CM | POA: Diagnosis not present

## 2019-06-20 DIAGNOSIS — I4891 Unspecified atrial fibrillation: Secondary | ICD-10-CM | POA: Diagnosis not present

## 2019-06-20 LAB — CBC WITH DIFFERENTIAL/PLATELET
Abs Immature Granulocytes: 0.02 10*3/uL (ref 0.00–0.07)
Basophils Absolute: 0 10*3/uL (ref 0.0–0.1)
Basophils Relative: 0 %
Eosinophils Absolute: 0.1 10*3/uL (ref 0.0–0.5)
Eosinophils Relative: 2 %
HCT: 26.8 % — ABNORMAL LOW (ref 36.0–46.0)
Hemoglobin: 8.8 g/dL — ABNORMAL LOW (ref 12.0–15.0)
Immature Granulocytes: 0 %
Lymphocytes Relative: 5 %
Lymphs Abs: 0.2 10*3/uL — ABNORMAL LOW (ref 0.7–4.0)
MCH: 30.1 pg (ref 26.0–34.0)
MCHC: 32.8 g/dL (ref 30.0–36.0)
MCV: 91.8 fL (ref 80.0–100.0)
Monocytes Absolute: 0.2 10*3/uL (ref 0.1–1.0)
Monocytes Relative: 4 %
Neutro Abs: 4.4 10*3/uL (ref 1.7–7.7)
Neutrophils Relative %: 89 %
Platelets: 107 10*3/uL — ABNORMAL LOW (ref 150–400)
RBC: 2.92 MIL/uL — ABNORMAL LOW (ref 3.87–5.11)
RDW: 18.5 % — ABNORMAL HIGH (ref 11.5–15.5)
WBC: 4.9 10*3/uL (ref 4.0–10.5)
nRBC: 0 % (ref 0.0–0.2)

## 2019-06-20 LAB — CMP (CANCER CENTER ONLY)
ALT: 17 U/L (ref 0–44)
AST: 20 U/L (ref 15–41)
Albumin: 2.8 g/dL — ABNORMAL LOW (ref 3.5–5.0)
Alkaline Phosphatase: 60 U/L (ref 38–126)
Anion gap: 9 (ref 5–15)
BUN: 66 mg/dL — ABNORMAL HIGH (ref 8–23)
CO2: 20 mmol/L — ABNORMAL LOW (ref 22–32)
Calcium: 8 mg/dL — ABNORMAL LOW (ref 8.9–10.3)
Chloride: 103 mmol/L (ref 98–111)
Creatinine: 1.62 mg/dL — ABNORMAL HIGH (ref 0.44–1.00)
GFR, Est AFR Am: 34 mL/min — ABNORMAL LOW (ref >60)
GFR, Estimated: 30 mL/min — ABNORMAL LOW (ref >60)
Glucose, Bld: 215 mg/dL — ABNORMAL HIGH (ref 70–99)
Potassium: 3.6 mmol/L (ref 3.5–5.1)
Sodium: 132 mmol/L — ABNORMAL LOW (ref 135–145)
Total Bilirubin: 0.6 mg/dL (ref 0.3–1.2)
Total Protein: 5.1 g/dL — ABNORMAL LOW (ref 6.5–8.1)

## 2019-06-20 LAB — RETICULOCYTES
Immature Retic Fract: 17 % — ABNORMAL HIGH (ref 2.3–15.9)
RBC.: 2.9 MIL/uL — ABNORMAL LOW (ref 3.87–5.11)
Retic Count, Absolute: 100.1 10*3/uL (ref 19.0–186.0)
Retic Ct Pct: 3.5 % — ABNORMAL HIGH (ref 0.4–3.1)

## 2019-06-20 LAB — IRON AND TIBC
Iron: 32 ug/dL — ABNORMAL LOW (ref 41–142)
Saturation Ratios: 15 % — ABNORMAL LOW (ref 21–57)
TIBC: 206 ug/dL — ABNORMAL LOW (ref 236–444)
UIBC: 175 ug/dL (ref 120–384)

## 2019-06-20 LAB — SAVE SMEAR(SSMR), FOR PROVIDER SLIDE REVIEW

## 2019-06-20 LAB — FERRITIN: Ferritin: 158 ng/mL (ref 11–307)

## 2019-06-20 MED ORDER — SODIUM CHLORIDE 0.9 % IV SOLN
Freq: Once | INTRAVENOUS | Status: AC
  Administered 2019-06-20: 11:00:00 via INTRAVENOUS
  Filled 2019-06-20: qty 250

## 2019-06-20 MED ORDER — DIPHENHYDRAMINE HCL 25 MG PO CAPS
25.0000 mg | ORAL_CAPSULE | Freq: Once | ORAL | Status: AC
  Administered 2019-06-20: 25 mg via ORAL

## 2019-06-20 MED ORDER — SODIUM CHLORIDE 0.9 % IV SOLN
375.0000 mg/m2 | Freq: Once | INTRAVENOUS | Status: AC
  Administered 2019-06-20: 12:00:00 600 mg via INTRAVENOUS
  Filled 2019-06-20: qty 50

## 2019-06-20 MED ORDER — ACETAMINOPHEN 325 MG PO TABS
ORAL_TABLET | ORAL | Status: AC
  Filled 2019-06-20: qty 2

## 2019-06-20 MED ORDER — ACETAMINOPHEN 325 MG PO TABS
650.0000 mg | ORAL_TABLET | Freq: Once | ORAL | Status: AC
  Administered 2019-06-20: 650 mg via ORAL

## 2019-06-20 MED ORDER — DIPHENHYDRAMINE HCL 25 MG PO CAPS
ORAL_CAPSULE | ORAL | Status: AC
  Filled 2019-06-20: qty 1

## 2019-06-20 NOTE — Progress Notes (Signed)
[name] [phone] Fax:([phone]   RS:WNIOEVO F BeanDOB: [birth year]MR#: [id number] EXH#:[id number]  Patient Care Team: [name], [name], MD as PCP - General (Internal Medicine) [name], MD as PCP - Advanced Heart Failure (Cardiology) Lorretta Harp, MD as PCP - Cardiology (Cardiology) [name], RN as [facility] Magrinat, [name], MD as Consulting Physician (Hematology and Oncology) Arta Silence, MD as Consulting Physician (Gastroenterology) [name], MD as Consulting Physician (Cardiology) Marzetta Board, DPM as Consulting Physician (Podiatry) Gavin Pound, MD as Consulting Physician (Rheumatology) [name], [facility] as [facility] (Pharmacist) OTHER MD:   CHIEF COMPLAINT: anemia and thrombocytopenia  CURRENT TREATMENT:[Feraheme], Aranesp, prednisone, rituximab  INTERVAL HISTORY: Dorothyreturns today for follow up of her anemia and thrombocytopenia.  She had been recieving feraheme, her most recent treatment being 05/12/2019.Her most recent ferritin on 06/20/2019 was 158.   She also continues on aranesp, with her most recent dose on 06/13/2019. She tolerates this well.    She underwentSPECTtesting fortransthyretinamyloidosison 06/03/2019, which was negative.  She is status post two treatments with rituximab. She received a reduced dose of 100 mg with her first treatment because of concerns with tolerance. She tolerated both treatments well well without any adverse side effects.   She was started on prednisone at 40 mg in the morning at her last hospitalization on 05/26/2019. Her dose was tapered to 30 mg starting on 06/13/2019. Her dose will be tapered to 20 mg p.o. daily starting 06/20/2019  REVIEW OF SYSTEMS: The patient is feeling "really good"  today. Her main complaint is fatigue, of which, is improving.   Dorothyis  tolerating the prednisone well except for some insomnia. She is taking 30 mg of prednisone in the morning. She saw her PCP yesterday who increased her xanax to 0.5 mg to take before bed.  She also saw her PCP to discuss her blood glucose. Her A1C is 5.7 at this time. She is going to stay on the same diabetes regimen with metformin. She still is reporting some bilateral lower extremity swelling for which she is taking 60 mg of lasixs daily. She is trying to elevate her lower extremities with a pillow and tries to wear compression stockings when she can. Her PCP reviewed how to take her lasix with her for management of her lower extremity swelling.   The patient received two weekly treatment with rituxan and has tolerated it well without any adverse side effects. She received a reduced dose of 100 mg of Rituxan for her first treatment due to concerns with tolerability. She denies any adverse side effects such as fever, rash, or bleeding or bruising. She denies any cough, chest pain, or shortness of breath.   She denies any bleeding or bruising. She denies melena, hematochezia, gingival bleeding, or epistaxis. All other review of systems otherwise noncontributory.    HISTORY OF CURRENT ILLNESS: From the original consult note:  [name] [place] 78 [place]She has a past medical history significantforPAD s/p stenting; moderate to severe pulmonary HTN; afib and AVR on Coumadin; HTN; HLD; DM; and CAD s/p CABG.the patient was admitted to the hospital for anemia. She was having increasing fatigue and shortness of breath and had routine lab work drawn which showed a hemoglobin of 6.6. Due to her abnormal lab work, she was advised to come to the emergency room for admission. She was also admitted in February 2020 for symptomatic anemia. She reports  she has been anemic for at least a year and has been taking oral iron. She underwent a colonoscopy and EGD on  01/22/2018.The colonoscopy showed diverticulosis in the sigmoid colon and descending colon, internal hemorrhoids. The upper endoscopy was normal except for erythematous mucosa in the antrum. She had a repeat EGD on 11/21/2018 that showed afew minimally oozing( with scope trauma) superficial gastric ulcers with pigmented material were found in the gastric body. The largest lesion was 4 mm in largest dimension.She underwent another upper endoscopy on 02/27/2019 which showed patchy mildly friable mucosa with contact bleeding was found in the gastric fundus, in the gastric body and in the gastric antrum.The patient is maintained on warfarin due to A. fib and a mechanical aortic valve. Review of the patient's chart shows that she has been anemic dating back to at least December thousand 12 (these are the earliest labs available to me). Her highest hemoglobin was 11.6 in December 2012. I also note that the patient has had intermittent mild leukopenia adding back to at least April 2019. Her lowest white blood cell count was 2.6 in April 2019.  Since admission, the patient has received 1 unit of packed red blood cells. She is currently on heparin for her procedure with plan to bridge back to Coumadin. Stool for occult blood was negative x1 on 02/24/2019. Hematology was asked see the patient to make recommendations regarding her anemia.  [name] subsequent history is as detailed below  MEDICAL HISTORY: Past Medical History:  Diagnosis Date   Aortic atherosclerosis ([place]) 01/23/2018   Atrial fibrillation ([facility])    Carotid artery disease ([place])    Cholelithiasis 01/23/2018   Chronic anticoagulation    Coronary artery disease    status post coronary artery bypass grafting times 07/10/2004   Diabetes mellitus    Hypercholesteremia    Hypertension    Mechanical heart valve present    H. aortic valve replacement at the time of bypass surgery October 2005   Moderate to severe pulmonary  hypertension ([place])    Peripheral arterial disease ([place])    history of left common iliac artery PTA and stenting for a chronic total occlusion 08/26/01    ALLERGIES:  is allergic to flagyl [metronidazole]; coreg [carvedilol]; losartan; verapamil; zetia [ezetimibe]; and zocor [simvastatin - high dose].  MEDICATIONS:  Current Outpatient Medications  Medication Sig Dispense Refill   allopurinol (ZYLOPRIM) 100 MG tablet Take 100 mg by mouth daily.     ALPRAZolam (XANAX) 0.5 MG tablet Take 0.25 mg by mouth at bedtime.      atorvastatin (LIPITOR) 80 MG tablet Take 1 tablet (80 mg total) by mouth daily at 6 PM.     Cholecalciferol (VITAMIN D3) 1000 UNITS CAPS Take 1,000 Units by mouth daily with lunch.      cloNIDine (CATAPRES) 0.1 MG tablet Take 1 tablet (0.1 mg total) by mouth 2 (two) times daily. 60 tablet 1   Cyanocobalamin (VITAMIN B12) 1000 MCG TBCR Take 1,000 mcg by mouth daily with lunch.      feeding supplement, GLUCERNA SHAKE, (GLUCERNA SHAKE) LIQD Take 237 mLs by mouth 2 (two) times daily between meals. 237 mL 30   furosemide (LASIX) 40 MG tablet Take 1.5 tablets (60 mg total) by mouth daily. 135 tablet 6   hydrALAZINE (APRESOLINE) 100 MG tablet TAKE 1 TABLET THREE TIMES DAILY 270 tablet 1   magnesium oxide (MAG-OX) 400 (241.3 Mg) MG tablet Take 1 tablet (400 mg total) by mouth daily.     metFORMIN (  GLUCOPHAGE) 500 MG tablet Take 500 mg by mouth 2 (two) times daily with a meal.      metoprolol tartrate (LOPRESSOR) 25 MG tablet Take 0.5 tablets (12.5 mg total) by mouth 2 (two) times daily. 60 tablet 0   pantoprazole (PROTONIX) 40 MG tablet Take 40 mg by mouth 2 (two) times daily.      predniSONE (DELTASONE) 20 MG tablet Take 2 tablets (40 mg total) by mouth daily with breakfast. 180 tablet 0   spironolactone (ALDACTONE) 25 MG tablet Take 25 mg by mouth daily.     tadalafil, PAH, (ADCIRCA) 20 MG tablet Take 2 tablets (40 mg total) by mouth daily. 60 tablet 6   vitamin C  (ASCORBIC ACID) 500 MG tablet Take 500 mg by mouth daily with lunch.     warfarin (COUMADIN) 5 MG tablet Take 5-7.5 mg by mouth daily. Take 1.5 tablets on Tues, Thursday, Saturday and Sunday. Take 1 tablet on Mondays, Wednesdays, and Fridays.     acetaminophen (TYLENOL) 325 MG tablet Take 325 mg by mouth every 6 (six) hours as needed for mild pain.      diphenhydrAMINE-zinc acetate (BENADRYL) cream Apply topically 3 (three) times daily as needed for itching. (Patient not taking: Reported on 06/20/2019) 28.4 g 0   ondansetron (ZOFRAN ODT) 4 MG disintegrating tablet Take 1 tablet (4 mg total) by mouth every 8 (eight) hours as needed for nausea or vomiting. (Patient not taking: Reported on 06/20/2019) 6 tablet 0   No current facility-administered medications for this visit.    Facility-Administered Medications Ordered in Other Visits  Medication Dose Route Frequency Provider Last Rate Last Dose   riTUXimab (RITUXAN) 600 mg in sodium chloride 0.9 % 250 mL (1.9355 mg/mL) infusion  375 mg/m2 (Treatment Plan Recorded) Intravenous Once Magrinat, [name], MD        SURGICAL HISTORY:  Past Surgical History:  Procedure Laterality Date   AORTIC VALVE REPLACEMENT     CARDIAC CATHETERIZATION  11/10/2004   40% right common illiac, 70% in stent restenosis of distal left common illiac,    CARDIAC CATHETERIZATION  05/18/2004   LAD 50-70% midstenosis, RCA dominant w/50% stenosis, 50% Right common Illiac artery ostial stenosis, 90% in stent restenosis within midportion of left common illiac stent   Carotid Duplex  03/12/2012   RSA-elev. velocities suggestive of a 50-69% diameter reduction, Right&Left Bulb/Prox ICA-mild-mod.fibrous plaqueelevating Velocities abnormal study.   CHOLECYSTECTOMY N/A 03/01/2018   Procedure: LAPAROSCOPIC CHOLECYSTECTOMY;  Surgeon: Judeth Horn, MD;  Location: [facility];  Service: General;  Laterality: N/A;   COLONOSCOPY WITH PROPOFOL N/A 01/22/2018   Procedure: COLONOSCOPY WITH  PROPOFOL;  Surgeon: Wilford Corner, MD;  Location: [facility];  Service: Endoscopy;  Laterality: N/A;   ESOPHAGOGASTRODUODENOSCOPY (EGD) WITH PROPOFOL N/A 01/22/2018   Procedure: ESOPHAGOGASTRODUODENOSCOPY (EGD) WITH PROPOFOL;  Surgeon: Wilford Corner, MD;  Location: [facility];  Service: Endoscopy;  Laterality: N/A;   ESOPHAGOGASTRODUODENOSCOPY (EGD) WITH PROPOFOL N/A 11/21/2018   Procedure: ESOPHAGOGASTRODUODENOSCOPY (EGD) WITH PROPOFOL;  Surgeon: [name], MD;  Location: [facility];  Service: Gastroenterology;  Laterality: N/A;   ESOPHAGOGASTRODUODENOSCOPY (EGD) WITH PROPOFOL Left 02/27/2019   Procedure: ESOPHAGOGASTRODUODENOSCOPY (EGD) WITH PROPOFOL;  Surgeon: Arta Silence, MD;  Location[facility];  Service: Endoscopy;  Laterality: Left;   GIVENS CAPSULE STUDY N/A 11/21/2018   Procedure: GIVENS CAPSULE STUDY;  Surgeon: [name], MD;  Location: [facility];  Service: Gastroenterology;  Laterality: N/A;  To be deployed during EGD   Lower Ext. Duplex  03/12/2012   Right  Proximal CIA- vessel narrowing w/elevated velocities 0-49% diameter reduction. Right SFA-mild mixed density plaque throughout vessel.   NM MYOCAR PERF WALL MOTION  05/19/2010   protocol: Persantine, post stress EF 65%, negative for ischemia, low risk scan   RIGHT HEART CATH N/A 06/27/2018   Procedure: RIGHT HEART CATH;  Surgeon: [name], MD;  Location: [facility];  Service: Cardiovascular;  Laterality: N/A;   TRANSTHORACIC ECHOCARDIOGRAM  08/29/2012   Moderately calcified annulus of mitral valve, moderate regurg. of both mitral valve and tricuspid valve.    FAMILY HISTORY:      Family History  Problem Relation Age of Onset   Breast cancer Neg Hx     GYNECOLOGIC HISTORY: No LMP recorded. Patient has had a hysterectomy. Menarche: years old Age at first live birth:  [place] P: 1 LMP:  Contraceptive:  HRT:  Hysterectomy?: yes BSO?:    SOCIAL HISTORY: (Current as  of 03/07/2019) [name] is widowed. She lives alone here in [place], [place]. She has 3 children that live locally. She smoked three quarters of a pack of cigarettes daily for 50 years. She quit in 2005. Denies alcohol use.  ADVANCED DIRECTIVES: Not in place   HEALTH MAINTENANCE: Social History        Tobacco Use   Smoking status: Former Smoker    Packs/day: 1.00    Years: 30.00    Pack years: 30.00    Types: Cigarettes   Smokeless tobacco: Never Used   Tobacco comment: quit smoking 2005  Substance Use Topics   Alcohol use: No    Alcohol/week: 0.0 standard drinks   Drug use: No   Colonoscopy: EGD on 02/27/2019 under Dr. Paulita Fujita found some friable gastric mucosa. Colonoscopy under Dr. Michail Sermon 01/22/2018 showed significant diverticular disease PAP:  Bone density:  Mammography: 02/25/2017, breast density category B.   PHYSICAL EXAMINATION:  Blood pressure (!) 144/59, pulse (!) 53, temperature 98.5 F (36.9 C), temperature source Oral, resp. rate 18, height 5\' 2"  (1.575 m), weight 126 lb 14.4 oz (57.6 kg), SpO2 99 %.  ECOG PERFORMANCE STATUS: 1 - Symptomatic but completely ambulatory  Physical Exam  Constitutional: Oriented to person, place, and time and well-developed, well-nourished, and in no distress.  HENT:  Head: Normocephalic and atraumatic.  Mouth/Throat: Oropharynx is clear and moist. No oropharyngeal exudate.  Eyes: Conjunctivae are normal. Right eye exhibits no discharge. Left eye exhibits no discharge. No scleral icterus.  Neck: Normal range of motion. Neck supple.  Cardiovascular: Normal rate, regular rhythm, normal heart sounds and intact distal pulses.   Pulmonary/Chest: Effort normal and breath sounds normal. No respiratory distress. No wheezes. No rales.  Abdominal: Soft. Bowel sounds are normal. Exhibits no distension and no mass. There is no tenderness.  Musculoskeletal:  Bilateral lower extremity edema (L>R). Normal range of motion.  Lymphadenopathy:    No cervical adenopathy.  Neurological: Alert and oriented to person, place, and time. Exhibits normal muscle tone. Gait normal. Coordination normal.  Skin: Skin is warm and dry. No rash noted. Not diaphoretic. No erythema. No pallor.  Psychiatric: Mood, memory and judgment normal.  Vitals reviewed.  LABORATORY DATA: Lab Results  Component Value Date   WBC 4.9 06/20/2019   HGB 8.8 (L) 06/20/2019   HCT 26.8 (L) 06/20/2019   MCV 91.8 06/20/2019   PLT 107 (L) 06/20/2019      Chemistry      Component Value Date/Time   NA 132 (L) 06/20/2019 0859   NA 137 12/06/2017 1436  K 3.6 06/20/2019 0859   CL 103 06/20/2019 0859   CO2 20 (L) 06/20/2019 0859   BUN 66 (H) 06/20/2019 0859   BUN 20 12/06/2017 1436   CREATININE 1.62 (H) 06/20/2019 0859      Component Value Date/Time   CALCIUM 8.0 (L) 06/20/2019 0859   ALKPHOS 60 06/20/2019 0859   AST 20 06/20/2019 0859   ALT 17 06/20/2019 0859   BILITOT 0.6 06/20/2019 0859       RADIOGRAPHIC STUDIES:  Nm Cardiac Amyloid Tumor Loc Inflam Spect 1 Day  Result Date: 06/03/2019 Qualitative myocardial uptake (visual score) is grade 1 (less than bone). Quantitative heart to contralateral ratio is 1.2. Low suspicion for cardiac amyloidosis.  Dg Chest Port 1 View  Result Date: 05/28/2019 CLINICAL DATA:  Pulmonary edema EXAM: PORTABLE CHEST 1 VIEW COMPARISON:  05/26/2019 FINDINGS: Diffuse bilateral interstitial and alveolar airspace opacities similar in appearance to the prior exam. Calcified right upper lobe pulmonary nodule and left lower lobe pulmonary nodule consistent with sequela prior granulomatous disease. Trace bilateral pleural effusions. Hyperinflated bilateral lungs as can be seen with COPD. No pneumothorax. Stable cardiomegaly. Prior CABG. No acute osseous abnormality. IMPRESSION: Findings most concerning for persistent pulmonary edema. Electronically  Signed   By: Kathreen Devoid   On: 05/28/2019 06:55   Dg Chest Portable 1 View  Result Date: 05/26/2019 CLINICAL DATA:  Weakness and shortness of breath since last night. EXAM: PORTABLE CHEST 1 VIEW COMPARISON:  05/08/2019. FINDINGS: Stable enlarged cardiac silhouette and post CABG changes. Increased diffuse prominence of the interstitial markings, especially on the right, with probable mild interspersed airspace opacities. Kerley lines are noted at the right lung base. No pleural fluid is seen. Bilateral calcified granulomata are again noted. Also noted are coarse left breast calcifications compatible with degenerated fibroadenomata. Diffuse osteopenia. Mild to moderate left and minimal right glenohumeral joint degenerative changes. IMPRESSION: 1. Worsening pulmonary edema or pneumonia/pneumonitis. 2. Stable cardiomegaly. Electronically Signed   By: Claudie Revering M.D.   On: 05/26/2019 09:01     ASSESSMENT/PLAN:   ELIGIBLE FOR AVAILABLE RESEARCH PROTOCOL: no   ASSESSMENT:78 y.o.[name] woman with multifactorial anemia, as follows: (a) anemia of chronic inflammation: As of May 2020 she has a SED rate of 95, CRP 1.1, positive ANA and RF;subsequently she was found to be ANCA positive (b) hemolysis (mild): she has a mildly elevated LDH and a DAT positive for complement, not IgG; this is c/w (a) above (c) anemia of renal insufficiency: inadequate EPO resonse to anemia (d) GIB/ iron deficiency in patient on lifelong anticoagulaton  (1) Feraheme received 03/01/2019, repeated 03/20/2019  (a) reticulocyte 03/07/2019 up from 43.4 a year ago to 191.8 after iron infusion (b) feraheme on 05/19/2019  (2) darbepoetin: starting 05/05/2019 at 170mcg given every 2 weeks.   (3)ANCA positive vasculitis: diagnosed 05/08/2019 (titer 1:640) (a) prednisone 40 mg daily started 05/31/2019.                           (I) begin tapering 30 mg daily starting 06/12/2019                         (ii) 20 mg p.o. daily of prednisone starting 06/20/2019 (b) rituximab weekly started (i) hepatitis B sAg negative  (4) osteopenia with a T score of -1.5 on bone density 02/28/2016 at the breast center  PLAN: This is a very pleasant 78 year old Caucasian female who is being  treated for anemia and thrombocytopenia. The patient was seen today and discussed with Dr. Jana Hakim.  Regarding her prednisone, the patient was instructed to begin tapering her dose to 20 mg p.o. daily for the next 2-4 weeks. We will discuss further tapering instructions at her future visits.   The patient is status post her first two weekly treatments of Rituxan. She received a reduced dose of 100 mg of rituxan during her first treatment due to concerns with tolerability. She received the full 375 mg/m2 dose of rituxan well without any adverse side effects. She will receive the full dose of 375 mg/m2 today as scheduled. Per Dr. [name] last note, the plan will be to proceed to 6-8 weekly doses of rituximab and the patient will taper off her prednisone. She then will continue on rituximab maintenance as tolerated.  [name] hemoglobin continues to be fairly stable at 8.8 today. Her labs are acceptable for treatment today. She will receive her 3rd weekly treatment of 375 mg/m2 of Rituxan today as scheduled.    Regarding the patient's bilateral lower extremity swelling, the patient was encouraged to use her compression stockings and to elevate her lower extremities. She will continue to take her lasixs as prescribed by her PCP. We will continue to monitor her swelling/weight closely.   The patient will continue to take her oral iron supplements as prescribed. The patient had been tolerating her oral iron well without any significant adverse effects.   We will see the patient back for a follow up visit on  07/01/2019 for evaluation and repeat labs before starting cycle #4 of Rituxan.   The patient was advised to call immediately if she has any concerning symptoms in the interval. The patient voices understanding of current disease status and treatment options and is in agreement with the current care plan. All questions were answered. The patient knows to call the clinic with any problems, questions or concerns. We can certainly see the patient much sooner if necessary   No orders of the defined types were placed in this encounter.    [name] Aggarwal L Savino Whisenant, PA-C 06/20/19

## 2019-06-20 NOTE — Patient Instructions (Signed)
[facility] Discharge Instructions for Patients Receiving Chemotherapy  Today you received the following chemotherapy agents:  Rituxan.  To help prevent nausea and vomiting after your treatment, we encourage you to take your nausea medication as directed.   If you develop nausea and vomiting that is not controlled by your nausea medication, call the clinic.   BELOW ARE SYMPTOMS THAT SHOULD BE REPORTED IMMEDIATELY:  *FEVER GREATER THAN 100.5 F  *CHILLS WITH OR WITHOUT FEVER  NAUSEA AND VOMITING THAT IS NOT CONTROLLED WITH YOUR NAUSEA MEDICATION  *UNUSUAL SHORTNESS OF BREATH  *UNUSUAL BRUISING OR BLEEDING  TENDERNESS IN MOUTH AND THROAT WITH OR WITHOUT PRESENCE OF ULCERS  *URINARY PROBLEMS  *BOWEL PROBLEMS  UNUSUAL RASH Items with * indicate a potential emergency and should be followed up as soon as possible.  Feel free to call the clinic should you have any questions or concerns. The clinic phone number is [phone].  Please show the CHEMO ALERT CARD at check-in to the Emergency Department and triage nurse.   

## 2019-06-23 ENCOUNTER — Inpatient Hospital Stay: Payer: Medicare HMO | Admitting: Adult Health

## 2019-06-23 ENCOUNTER — Inpatient Hospital Stay: Payer: Medicare HMO

## 2019-06-23 ENCOUNTER — Telehealth: Payer: Self-pay | Admitting: Oncology

## 2019-06-23 NOTE — Telephone Encounter (Signed)
I talk with patient regarding schedule  

## 2019-06-25 ENCOUNTER — Telehealth: Payer: Self-pay | Admitting: Adult Health

## 2019-06-25 NOTE — Telephone Encounter (Signed)
[facility] PAL 9/28. Moved 9/28 appointments to 9/29. Confirmed with patient. Patient to get updated schedule at 9/22 visit.

## 2019-06-27 ENCOUNTER — Other Ambulatory Visit: Payer: Self-pay | Admitting: Pharmacist

## 2019-06-27 ENCOUNTER — Ambulatory Visit: Payer: Self-pay | Admitting: Pharmacist

## 2019-06-27 NOTE — Patient Outreach (Signed)
[name]) Care Management  06/27/2019  [name] 1941/09/18 [id number]   Patient was called to follow up. HIPAA identifiers were obtained X 2.  Patient said she felt "ok" today.  She did report 6/10 left hip pain that radiates down to her left knee. She said she has been using a heating pad which has helped.  Patient's medications were reviewed:  Medications Reviewed Today    Reviewed by [name], [facility] (Pharmacist) on 06/27/19 at 1233  Med List Status: <None>  Medication Order Taking? Sig Documenting Provider Last Dose Status Informant  acetaminophen (TYLENOL) 325 MG tablet [id number] Yes Take 325 mg by mouth every 6 (six) hours as needed for mild pain.  [provider] Taking Active Self  allopurinol (ZYLOPRIM) 100 MG tablet [id number] Yes Take 100 mg by mouth daily. [provider] Taking Active Self  ALPRAZolam (XANAX) 0.5 MG tablet [id number] Yes Take 0.25 mg by mouth at bedtime.  [provider] Taking Active Self           Med Note Gloriann Loan, Ellard Artis   Fri Jun 20, 2019  9:53 AM) "Dr Aundra Dubin increased it to a whole tablet 0.50 mg to see if it will help me sleep".  atorvastatin (LIPITOR) 80 MG tablet [id number] Yes Take 1 tablet (80 mg total) by mouth daily at 6 PM. [name], MD Taking Active Self  Cholecalciferol (VITAMIN D3) 1000 UNITS CAPS [id number] Yes Take 2,000 Units by mouth daily with lunch.  [provider] Taking Active Self           Med Note [name]   Fri Jun 27, 2019 12:31 PM)    cloNIDine (CATAPRES) 0.1 MG tablet [id number] Yes Take 1 tablet (0.1 mg total) by mouth 2 (two) times daily. Cherylann Ratel A, DO Taking Active   Cyanocobalamin (VITAMIN B12) 1000 MCG TBCR [id number] Yes Take 1,000 mcg by mouth daily with lunch.  [provider] Taking Active Self       Patient not taking:      Discontinued 06/27/19 1231 (Patient Preference)            Med Note (BELL, DIANE H   Fri Jun 20, 2019  9:51 AM) "  I do not know why this is on my med list ,I don't have any itching".  feeding supplement, GLUCERNA SHAKE, ([place]) LIQD [id number] Yes Take 237 mLs by mouth 2 (two) times daily between meals. [name], MD Taking Active Self           Med Note Gloriann Loan, Ellard Artis   Fri Jun 20, 2019  9:51 AM) Drinks Boost , not GLucerna  furosemide (LASIX) 40 MG tablet [id number] Yes Take 1.5 tablets (60 mg total) by mouth daily. [name], MD Taking Active Self  hydrALAZINE (APRESOLINE) 100 MG tablet [id number] Yes TAKE 1 TABLET THREE TIMES DAILY [name], MD Taking Active   magnesium oxide (MAG-OX) 400 (241.3 Mg) MG tablet [id number] Yes Take 1 tablet (400 mg total) by mouth daily. [name], MD Taking Active Self           Med Note Gloriann Loan, Ellard Artis   Fri Jun 20, 2019  9:52 AM) "it is 500 mg my neighbor picked it up at the pharmacy".  metFORMIN (GLUCOPHAGE) 500 MG tablet [id number] Yes Take 500 mg by mouth 2 (two) times daily with a meal.  [provider] Taking Active Self  metoprolol tartrate (LOPRESSOR) 25 MG tablet  [id number] Yes Take 0.5 tablets (12.5 mg total) by mouth 2 (two) times daily. [place] Donnamarie Poag, DO Taking Active Self  ondansetron (ZOFRAN ODT) 4 MG disintegrating tablet [id number] Yes Take 1 tablet (4 mg total) by mouth every 8 (eight) hours as needed for nausea or vomiting. [name], [name], PA-C Taking Active Self           Med Note [name]   Fri Jun 27, 2019 12:33 PM) ONLY PRN  pantoprazole (PROTONIX) 40 MG tablet [id number] Yes Take 40 mg by mouth 2 (two) times daily.  [provider] Taking Active Self           Med Note [name]   Wed Jun 20, 2017 10:33 AM)    predniSONE (DELTASONE) 20 MG tablet [id number] Yes Take 2 tablets (40 mg total) by mouth daily with breakfast. Magrinat, [name], MD Taking Active   spironolactone (ALDACTONE) 25 MG tablet [id number] Yes Take 25 mg by mouth daily. [provider] Taking Active Self   tadalafil, PAH, (ADCIRCA) 20 MG tablet [id number] Yes Take 2 tablets (40 mg total) by mouth daily. [name], MD Taking Active Self  vitamin C (ASCORBIC ACID) 500 MG tablet [id number] Yes Take 500 mg by mouth daily with lunch. [provider] Taking Active Self  warfarin (COUMADIN) 5 MG tablet [id number] Yes Take 5-7.5 mg by mouth daily. Take 1.5 tablets on Tues, Thursday, Saturday and Sunday. Take 1 tablet on Mondays, Wednesdays, and Fridays. [provider] Taking Active Self         INR -2 goal (2-3) from 06/11/2019  Patient asked about the socks/hose she left the hospital with and wondered if wearing them was doing her any good.  She said she called a DME company and once she described what she was wearing said they told her she did not have compression hose. She was encouraged to reach out to her PCP as she was just seen 06/20/2019 where the following addressed compression:  "She still is reporting some bilateral lower extremity swelling for which she is taking 60 mg of lasixs daily. She is trying to elevate her lower extremities with a pillow and tries to wear compression stockings when she can." (Cassandra Heillingoetter, PA-C 06/20/2019)   Plan: Follow up with patient in 3-4 weeks.  [name], PharmD, [place] Clinical Pharmacist [phone]

## 2019-06-30 NOTE — Progress Notes (Signed)
[place]  [phone] [phone]    ID: [name] DOB: Aug 10, 1941  MR#: [id number]  VEL#:[id number]  Patient Care Team: [name], MD as PCP - General (Internal Medicine) [name], MD as PCP - Advanced Heart Failure (Cardiology) Lorretta Harp, MD as PCP - Cardiology (Cardiology) [name], RN as [place] Management Danya Spearman, [name], MD as Consulting Physician (Hematology and Oncology) Arta Silence, MD as Consulting Physician (Gastroenterology) [name], MD as Consulting Physician (Cardiology) Marzetta Board, DPM as Consulting Physician (Podiatry) Gavin Pound, MD as Consulting Physician (Rheumatology) [name], [facility] as [place] Management (Pharmacist) OTHER MD:   CHIEF COMPLAINT: anemia and thrombocytopenia  CURRENT TREATMENT: [Feraheme], Aranesp, prednisone, rituximab   INTERVAL HISTORY: Almadelia returns today for follow-up and treatment of her anemia and thrombocytopenia. She was last seen here on 06/05/2019.   She previously received Feraheme for her iron deficiency, with the last dose 05/12/2019.  Her most recent ferritin on 06/20/2019 was 158, down from 466 in August (but recall ferritin is an acute phase reactant.)  She received Aranesp 05/05/2019, 05/19/2019 and 06/12/2019.  The order appears to have dropped out.  She will resume Aranesp today and we will repeat that every 2 weeks, the goal being hemoglobin greater than 10.  She was started on rituximab with a test dose 06/05/2019, first full dose 06/12/2019.  She was scheduled to receive treatment 06/27/2019 but for some reason that did not occur.  She is currently on prednisone 20 mg daily for her vasculitis.  Since her last visit here, she has not undergone any additional studies.   Lab Results  Component Value Date   IRON 49 07/01/2019   IRON 32 (L) 06/20/2019   IRON 63 06/12/2019   IRON 49 06/05/2019   IRON 51 05/26/2019   TIBC 233 (L) 07/01/2019   TIBC 206 (L) 06/20/2019   TIBC 229 (L) 06/12/2019   TIBC 223 (L) 06/05/2019   TIBC 239 (L) 05/26/2019   LDH 197 (H) 02/27/2019     REVIEW OF SYSTEMS: Ayesha tells me she is feeling "better".  She is doing some housework but not not otherwise getting around much.  She tells me her family is attentive.  She has had no intercurrent fevers, nausea, vomiting, visual changes, or problems with falls.  There is been no cough phlegm production pleurisy or change in bowel or bladder habits.  She denies pain or bleeding problems.  A detailed review of systems today was otherwise stable   HISTORY OF CURRENT ILLNESS: From the original consult note:  [name] is an 78 y.o. female from [place], [place].  She has a past medical history significant forPAD s/p stenting; moderate to severe pulmonary HTN; afib and AVR on Coumadin; HTN; HLD; DM; and CAD s/p CABG. the patient was admitted to the hospital for anemia.  She was having increasing fatigue and shortness of breath and had routine lab work drawn which showed a hemoglobin of 6.6.  Due to her abnormal lab work, she was advised to come to the emergency room for admission.  She was also admitted in February 2020 for symptomatic anemia.  She reports she has been anemic for at least a year and has been taking oral iron.  She underwent a colonoscopy and EGD on 01/22/2018.  The colonoscopy showed diverticulosis in the sigmoid colon and descending colon, internal hemorrhoids.  The upper endoscopy was normal except for  erythematous mucosa in the antrum.  She had a repeat EGD on 11/21/2018 that showed a few minimally oozing( with scope trauma) superficial gastric ulcers with pigmented material were found in the gastric body. The largest lesion was 4 mm in largest dimension.  She underwent another upper endoscopy on 02/27/2019 which showed patchy mildly friable mucosa with contact bleeding  was found in the gastric fundus, in the gastric body and in the gastric antrum.  The patient is maintained on warfarin due to A. fib and a mechanical aortic valve.  Review of the patient's chart shows that she has been anemic dating back to at least December thousand 12 (these are the earliest labs available to me).  Her highest hemoglobin was 11.6 in December 2012.  I also note that the patient has had intermittent mild leukopenia adding back to at least April 2019.  Her lowest white blood cell count was 2.6 in April 2019.  Since admission, the patient has received 1 unit of packed red blood cells.  She is currently on heparin for her procedure with plan to bridge back to Coumadin.  Stool for occult blood was negative x1 on 02/24/2019.  Hematology was asked see the patient to make recommendations regarding her anemia.  [name] subsequent history is as detailed below.   PAST MEDICAL HISTORY: Past Medical History:  Diagnosis Date   Aortic atherosclerosis ([place]) 01/23/2018   Atrial fibrillation (H[facility])    Carotid artery disease ([place])    Cholelithiasis 01/23/2018   Chronic anticoagulation    Coronary artery disease    status post coronary artery bypass grafting times 07/10/2004   Diabetes mellitus    Hypercholesteremia    Hypertension    Mechanical heart valve present    H. aortic valve replacement at the time of bypass surgery October 2005   Moderate to severe pulmonary hypertension ([place])    Peripheral arterial disease ([place])    history of left common iliac artery PTA and stenting for a chronic total occlusion 08/26/01     PAST SURGICAL HISTORY: Past Surgical History:  Procedure Laterality Date   AORTIC VALVE REPLACEMENT     CARDIAC CATHETERIZATION  11/10/2004   40% right common illiac, 70% in stent restenosis of distal left common illiac,    CARDIAC CATHETERIZATION  05/18/2004   LAD 50-70% midstenosis, RCA dominant w/50% stenosis, 50% Right common Illiac artery ostial  stenosis, 90% in stent restenosis within midportion of left common illiac stent   Carotid Duplex  03/12/2012   RSA-elev. velocities suggestive of a 50-69% diameter reduction, Right&Left Bulb/Prox ICA-mild-mod.fibrous plaqueelevating Velocities abnormal study.   CHOLECYSTECTOMY N/A 03/01/2018   Procedure: LAPAROSCOPIC CHOLECYSTECTOMY;  Surgeon: Judeth Horn, MD;  Location: [place];  Service: General;  Laterality: N/A;   COLONOSCOPY WITH PROPOFOL N/A 01/22/2018   Procedure: COLONOSCOPY WITH PROPOFOL;  Surgeon: Wilford Corner, MD;  Location: [place];  Service: Endoscopy;  Laterality: N/A;   ESOPHAGOGASTRODUODENOSCOPY (EGD) WITH PROPOFOL N/A 01/22/2018   Procedure: ESOPHAGOGASTRODUODENOSCOPY (EGD) WITH PROPOFOL;  Surgeon: Wilford Corner, MD;  Location: [place];  Service: Endoscopy;  Laterality: N/A;   ESOPHAGOGASTRODUODENOSCOPY (EGD) WITH PROPOFOL N/A 11/21/2018   Procedure: ESOPHAGOGASTRODUODENOSCOPY (EGD) WITH PROPOFOL;  Surgeon: [name], MD;  Location: [place];  Service: Gastroenterology;  Laterality: N/A;   ESOPHAGOGASTRODUODENOSCOPY (EGD) WITH PROPOFOL Left 02/27/2019   Procedure: ESOPHAGOGASTRODUODENOSCOPY (EGD) WITH PROPOFOL;  Surgeon: Arta Silence, MD;  [facility] ENDOSCOPY;  Service: Endoscopy;  Laterality: Left;   GIVENS CAPSULE STUDY N/A 11/21/2018   Procedure: GIVENS CAPSULE STUDY;  Surgeon: [name], MD;  Location: [place];  Service: Gastroenterology;  Laterality: N/A;  To be deployed during EGD   Lower Ext. Duplex  03/12/2012   Right Proximal CIA- vessel narrowing w/elevated velocities 0-49% diameter reduction. Right SFA-mild mixed density plaque throughout vessel.   NM MYOCAR PERF WALL MOTION  05/19/2010   protocol: Persantine, post stress EF 65%, negative for ischemia, low risk scan   RIGHT HEART CATH N/A 06/27/2018   Procedure: RIGHT HEART CATH;  Surgeon: [name], MD;  Location: [place] [facility];  Service: Cardiovascular;  Laterality:  N/A;   TRANSTHORACIC ECHOCARDIOGRAM  08/29/2012   Moderately calcified annulus of mitral valve, moderate regurg. of both mitral valve and tricuspid valve.     FAMILY HISTORY: Family History  Problem Relation Age of Onset   Breast cancer Neg Hx     GYNECOLOGIC HISTORY:  No LMP recorded. Patient has had a hysterectomy. Menarche:  years old Age at first live birth:  [place] P: 3 LMP:  Contraceptive:  HRT:   Hysterectomy?: yes BSO?:    SOCIAL HISTORY: (Current as of 03/07/2019) Cierria is widowed.  She lives by herself.  She has 3 children that live locally.  She smoked three quarters of a pack of cigarettes daily for 50 years.  She quit in 2005.  Denies alcohol use.   ADVANCED DIRECTIVES: Not in place    HEALTH MAINTENANCE: Social History   Tobacco Use   Smoking status: Former Smoker    Packs/day: 1.00    Years: 30.00    Pack years: 30.00    Types: Cigarettes   Smokeless tobacco: Never Used   Tobacco comment: quit smoking 2005  Substance Use Topics   Alcohol use: No    Alcohol/week: 0.0 standard drinks   Drug use: No    Colonoscopy: EGD on 02/27/2019 under Dr. Paulita Fujita found some friable gastric mucosa.  Colonoscopy under Dr. Michail Sermon 01/22/2018 showed significant diverticular disease  PAP:   Bone density:  Mammography: 02/25/2017, breast density category B.  Allergies  Allergen Reactions   Flagyl [Metronidazole] Rash    ALL-OVER BODY RASH   Coreg [Carvedilol] Other (See Comments)    Terrible cramping in the feet and had a lot of bowel movements, but not diarrhea   Losartan Swelling    Patient doesn't recall site of swelling   Verapamil Hives   Zetia [Ezetimibe] Other (See Comments)    Reaction not recalled   Zocor [Simvastatin - High Dose] Other (See Comments)    Reaction not recalled    Current Outpatient Medications  Medication Sig Dispense Refill   acetaminophen (TYLENOL) 325 MG tablet Take 325 mg by mouth every 6 (six) hours as needed for  mild pain.      allopurinol (ZYLOPRIM) 100 MG tablet Take 100 mg by mouth daily.     ALPRAZolam (XANAX) 0.5 MG tablet Take 0.25 mg by mouth at bedtime.      atorvastatin (LIPITOR) 80 MG tablet Take 1 tablet (80 mg total) by mouth daily at 6 PM.     Cholecalciferol (VITAMIN D3) 1000 UNITS CAPS Take 2,000 Units by mouth daily with lunch.      cloNIDine (CATAPRES) 0.1 MG tablet Take 1 tablet (0.1 mg total) by mouth 2 (two) times daily. 60 tablet 1   Cyanocobalamin (VITAMIN B12) 1000 MCG TBCR Take 1,000 mcg by mouth daily with lunch.      feeding supplement, GLUCERNA SHAKE, (GLUCERNA SHAKE) LIQD Take 237 mLs by mouth 2 (two) times  daily between meals. 237 mL 30   furosemide (LASIX) 40 MG tablet Take 1.5 tablets (60 mg total) by mouth daily. 135 tablet 6   hydrALAZINE (APRESOLINE) 100 MG tablet Take 1 tablet (100 mg total) by mouth 3 (three) times daily. 270 tablet 1   magnesium oxide (MAG-OX) 400 (241.3 Mg) MG tablet Take 1 tablet (400 mg total) by mouth daily.     metFORMIN (GLUCOPHAGE) 500 MG tablet Take 500 mg by mouth 2 (two) times daily with a meal.      metoprolol tartrate (LOPRESSOR) 25 MG tablet Take 0.5 tablets (12.5 mg total) by mouth 2 (two) times daily. 60 tablet 0   ondansetron (ZOFRAN ODT) 4 MG disintegrating tablet Take 1 tablet (4 mg total) by mouth every 8 (eight) hours as needed for nausea or vomiting. 6 tablet 0   pantoprazole (PROTONIX) 40 MG tablet Take 40 mg by mouth 2 (two) times daily.      predniSONE (DELTASONE) 20 MG tablet Take 2 tablets (40 mg total) by mouth daily with breakfast. 180 tablet 0   spironolactone (ALDACTONE) 25 MG tablet Take 25 mg by mouth daily.     tadalafil, PAH, (ADCIRCA) 20 MG tablet Take 2 tablets (40 mg total) by mouth daily. 60 tablet 6   vitamin C (ASCORBIC ACID) 500 MG tablet Take 500 mg by mouth daily with lunch.     warfarin (COUMADIN) 5 MG tablet Take 5-7.5 mg by mouth daily. Take 1.5 tablets on Tues, Thursday, Saturday and  Sunday. Take 1 tablet on Mondays, Wednesdays, and Fridays.     No current facility-administered medications for this visit.      OBJECTIVE: Elderly white woman examined in a wheelchair  Vitals:   07/01/19 1153  BP: (!) 168/75  Pulse: (!) 58  Resp: 18  Temp: 98.3 F (36.8 C)  SpO2: 99%   Wt Readings from Last 3 Encounters:  07/01/19 125 lb (56.7 kg)  06/20/19 126 lb 14.4 oz (57.6 kg)  06/12/19 124 lb 12.8 oz (56.6 kg)   Body mass index is 22.86 kg/m.    ECOG FS:2 - Symptomatic, <50% confined to bed  Ocular: Sclerae unicteric, pupils round and equal Ear-nose-throat: Wearing a mask Lymphatic: No cervical or supraclavicular adenopathy Lungs no rales or rhonchi Heart regular rate and rhythm Abd soft, nontender, positive bowel sounds MSK no focal spinal tenderness, no joint edema Neuro: non-focal, well-oriented, appropriate affect Breasts: Deferred    LAB RESULTS:  CMP     Component Value Date/Time   NA 131 (L) 07/01/2019 1133   NA 137 12/06/2017 1436   K 4.0 07/01/2019 1133   CL 100 07/01/2019 1133   CO2 22 07/01/2019 1133   GLUCOSE 293 (H) 07/01/2019 1133   BUN 54 (H) 07/01/2019 1133   BUN 20 12/06/2017 1436   CREATININE 1.40 (H) 07/01/2019 1133   CALCIUM 8.4 (L) 07/01/2019 1133   PROT 5.5 (L) 07/01/2019 1133   ALBUMIN 3.1 (L) 07/01/2019 1133   AST 25 07/01/2019 1133   ALT 19 07/01/2019 1133   ALKPHOS 65 07/01/2019 1133   BILITOT 0.6 07/01/2019 1133   GFRNONAA 35 (L) 07/01/2019 1133   GFRAA 41 (L) 07/01/2019 1133    Lab Results  Component Value Date   TOTALPROTELP 6.9 05/07/2019    No results found for: KPAFRELGTCHN, LAMBDASER, KAPLAMBRATIO  Lab Results  Component Value Date   WBC 4.6 07/01/2019   NEUTROABS 4.2 07/01/2019   HGB 9.3 (L) 07/01/2019   HCT 28.4 (L) 07/01/2019  MCV 91.3 07/01/2019   PLT 122 (L) 07/01/2019    @LASTCHEMISTRY @  No results found for: LABCA2  No components found for: BZJIRC789  No results for input(s): INR in  the last 168 hours.  No results found for: LABCA2  No results found for: FYB017  No results found for: PZW258  No results found for: NID782  No results found for: CA2729  No components found for: HGQUANT  No results found for: CEA1 / No results found for: CEA1   No results found for: AFPTUMOR  No results found for: CHROMOGRNA  No results found for: PSA1  Appointment on 07/01/2019  Component Date Value Ref Range Status   Iron 07/01/2019 49  41 - 142 ug/dL Final   TIBC 07/01/2019 233* 236 - 444 ug/dL Final   Saturation Ratios 07/01/2019 21  21 - 57 % Final   UIBC 07/01/2019 184  120 - 384 ug/dL Final   Performed at [facility], [place] [address], [place], [place] [address]   Sodium 07/01/2019 131* 135 - 145 mmol/L Final   Potassium 07/01/2019 4.0  3.5 - 5.1 mmol/L Final   Chloride 07/01/2019 100  98 - 111 mmol/L Final   CO2 07/01/2019 22  22 - 32 mmol/L Final   Glucose, Bld 07/01/2019 293* 70 - 99 mg/dL Final   BUN 07/01/2019 54* 8 - 23 mg/dL Final   Creatinine 07/01/2019 1.40* 0.44 - 1.00 mg/dL Final   Calcium 07/01/2019 8.4* 8.9 - 10.3 mg/dL Final   Total Protein 07/01/2019 5.5* 6.5 - 8.1 g/dL Final   Albumin 07/01/2019 3.1* 3.5 - 5.0 g/dL Final   AST 07/01/2019 25  15 - 41 U/L Final   ALT 07/01/2019 19  0 - 44 U/L Final   Alkaline Phosphatase 07/01/2019 65  38 - 126 U/L Final   Total Bilirubin 07/01/2019 0.6  0.3 - 1.2 mg/dL Final   GFR, Est Non Af Am 07/01/2019 35* >60 mL/min Final   GFR, Est AFR Am 07/01/2019 41* >60 mL/min Final   Anion gap 07/01/2019 9  5 - 15 Final   Performed at [facility], [place] [address], [place], [place] [address]   Smear Review 07/01/2019 SMEAR STAINED AND AVAILABLE FOR REVIEW   Final   Performed at [facility], [place] [address] [place], [place] [address]   Retic Ct Pct 07/01/2019 2.6  0.4 - 3.1 % Final   RBC. 07/01/2019 3.12* 3.87 - 5.11  MIL/uL Final   Retic Count, Absolute 07/01/2019 80.8  19.0 - 186.0 K/uL Final   Immature Retic Fract 07/01/2019 6.8  2.3 - 15.9 % Final   Performed at [facility], [place] [address], [place], [place] [address]   Ferritin 07/01/2019 158  11 - 307 ng/mL Final   Performed at [facility], [place] [address] [place], [place] [address]   WBC 07/01/2019 4.6  4.0 - 10.5 K/uL Final   RBC 07/01/2019 3.11* 3.87 - 5.11 MIL/uL Final   Hemoglobin 07/01/2019 9.3* 12.0 - 15.0 g/dL Final   HCT 07/01/2019 28.4* 36.0 - 46.0 % Final   MCV 07/01/2019 91.3  80.0 - 100.0 fL Final   MCH 07/01/2019 29.9  26.0 - 34.0 pg Final   MCHC 07/01/2019 32.7  30.0 - 36.0 g/dL Final   RDW 07/01/2019 17.6* 11.5 - 15.5 % Final   Platelets 07/01/2019 122* 150 - 400 K/uL Final   nRBC 07/01/2019 0.0  0.0 -  0.2 % Final   Neutrophils Relative % 07/01/2019 93  % Final   Neutro Abs 07/01/2019 4.2  1.7 - 7.7 K/uL Final   Lymphocytes Relative 07/01/2019 3  % Final   Lymphs Abs 07/01/2019 0.2* 0.7 - 4.0 K/uL Final   Monocytes Relative 07/01/2019 3  % Final   Monocytes Absolute 07/01/2019 0.1  0.1 - 1.0 K/uL Final   Eosinophils Relative 07/01/2019 0  % Final   Eosinophils Absolute 07/01/2019 0.0  0.0 - 0.5 K/uL Final   Basophils Relative 07/01/2019 0  % Final   Basophils Absolute 07/01/2019 0.0  0.0 - 0.1 K/uL Final   Immature Granulocytes 07/01/2019 1  % Final   Abs Immature Granulocytes 07/01/2019 0.05  0.00 - 0.07 K/uL Final   Performed at [facility], [place] [address], [place], [place] [address]    (this displays the last labs from the last 3 days)  Lab Results  Component Value Date   TOTALPROTELP 6.9 05/07/2019   (this displays SPEP labs)  No results found for: KPAFRELGTCHN, LAMBDASER, KAPLAMBRATIO (kappa/lambda light chains)  No results found for: HGBA, HGBA2QUANT, HGBFQUANT, HGBSQUAN (Hemoglobinopathy evaluation)   Lab  Results  Component Value Date   LDH 197 (H) 02/27/2019    Lab Results  Component Value Date   IRON 49 07/01/2019   TIBC 233 (L) 07/01/2019   IRONPCTSAT 21 07/01/2019   (Iron and TIBC)  Lab Results  Component Value Date   FERRITIN 158 07/01/2019    Urinalysis    Component Value Date/Time   COLORURINE YELLOW 05/26/2019 [place] 05/26/2019 1646   LABSPEC 1.006 05/26/2019 1646   PHURINE 6.0 05/26/2019 1646   GLUCOSEU NEGATIVE 05/26/2019 1646   HGBUR LARGE (A) 05/26/2019 1646   BILIRUBINUR NEGATIVE 05/26/2019 1646   KETONESUR NEGATIVE 05/26/2019 1646   PROTEINUR 100 (A) 05/26/2019 1646   UROBILINOGEN 1.0 10/06/2011 1709   NITRITE NEGATIVE 05/26/2019 1646   LEUKOCYTESUR MODERATE (A) 05/26/2019 1646     STUDIES:  Nm Cardiac Amyloid Tumor Loc Inflam Spect 1 Day  Result Date: 06/03/2019 Qualitative myocardial uptake (visual score) is grade 1 (less than bone). Quantitative heart to contralateral ratio is 1.2. Low suspicion for cardiac amyloidosis.    ELIGIBLE FOR AVAILABLE RESEARCH PROTOCOL: no   ASSESSMENT: 78 y.o. [place] woman with multifactorial anemia, as follows:             (a) anemia of chronic inflammation: As of May 2020 she has a SED rate of 95, CRP 1.1, positive ANA and RF; subsequently she was found to be ANCA positive             (b) hemolysis (mild): she has a mildly elevated LDH and a DAT positive for complement, not IgG; this is c/w (a) above             (c) anemia of renal insufficiency: inadequate EPO resonse to anemia              (d) GIB/ iron deficiency in patient on lifelong anticoagulaton  (1) Feraheme received 03/01/2019, repeated 03/20/2019   (a) reticulocyte 03/07/2019 up from 43.4 a year ago to 191.8 after iron infusion  (b) feraheme repeated on 05/19/2019  (2) darbepoetin: starting 05/05/2019 at 18mcg given every 2 weeks  (a) dose increased to 200 mcg every 2 weeks starting 07/01/2019.    (3) ANCA positive vasculitis:  diagnosed 05/08/2019 (titer 1:640)  (a) prednisone 40 mg daily started 05/31/2019, on taper  (b)  rituximab weekly started 06/12/2019 (received test dose 06/05/2019   (i) hepatitis B sAg, core Ab and HIV negative 05/08/2019  (4) osteopenia with a T score of -1.5 on bone density 02/28/2016 at the breast center    PLAN: Kadesia is clinically better, with her hemoglobin more stable now, over 9, although not yet at goal, which is between 10 and 11.  We are increasing the Aranesp to 200 mcg every 2 weeks with the first dose today.  Prednisone has been tapered to 20 mg daily.  She is tolerating that generally well although her blood sugar today was up.  She continues on metformin 1000 mg daily.  She is not having diarrhea issues related to that  She is tolerating the rituximab well and the plan is to continue to a total of 8 weekly doses.  The hope is that we can taper the prednisone to off and change the rituximab to maintenance.  Kinisha knows to call us for any other issue that may develop before the next visit here.  Tameyah Koch, [name], MD  07/01/19 6:16 PM Medical Oncology and Hematology [facility] [place], [place] [address] Tel. [phone]    Fax. [phone]  I, Jacqualyn Posey am acting as a Education administrator for Chauncey Cruel, MD.   I, Lurline Del MD, have reviewed the above documentation for accuracy and completeness, and I agree with the above.

## 2019-07-01 ENCOUNTER — Other Ambulatory Visit (HOSPITAL_COMMUNITY): Payer: Self-pay | Admitting: *Deleted

## 2019-07-01 ENCOUNTER — Inpatient Hospital Stay: Payer: Medicare HMO

## 2019-07-01 ENCOUNTER — Inpatient Hospital Stay (HOSPITAL_BASED_OUTPATIENT_CLINIC_OR_DEPARTMENT_OTHER): Payer: Medicare HMO | Admitting: Oncology

## 2019-07-01 ENCOUNTER — Other Ambulatory Visit: Payer: Self-pay

## 2019-07-01 VITALS — BP 193/60 | HR 65 | Temp 98.2°F | Resp 16

## 2019-07-01 VITALS — BP 168/75 | HR 58 | Temp 98.3°F | Resp 18 | Ht 62.0 in | Wt 125.0 lb

## 2019-07-01 DIAGNOSIS — D594 Other nonautoimmune hemolytic anemias: Secondary | ICD-10-CM

## 2019-07-01 DIAGNOSIS — I4891 Unspecified atrial fibrillation: Secondary | ICD-10-CM | POA: Diagnosis not present

## 2019-07-01 DIAGNOSIS — D696 Thrombocytopenia, unspecified: Secondary | ICD-10-CM

## 2019-07-01 DIAGNOSIS — D508 Other iron deficiency anemias: Secondary | ICD-10-CM

## 2019-07-01 DIAGNOSIS — D631 Anemia in chronic kidney disease: Secondary | ICD-10-CM | POA: Diagnosis not present

## 2019-07-01 DIAGNOSIS — I776 Arteritis, unspecified: Secondary | ICD-10-CM

## 2019-07-01 DIAGNOSIS — D61818 Other pancytopenia: Secondary | ICD-10-CM | POA: Diagnosis not present

## 2019-07-01 DIAGNOSIS — K648 Other hemorrhoids: Secondary | ICD-10-CM | POA: Diagnosis not present

## 2019-07-01 DIAGNOSIS — N189 Chronic kidney disease, unspecified: Secondary | ICD-10-CM | POA: Diagnosis not present

## 2019-07-01 DIAGNOSIS — I700 Atherosclerosis of aorta: Secondary | ICD-10-CM | POA: Diagnosis not present

## 2019-07-01 DIAGNOSIS — I739 Peripheral vascular disease, unspecified: Secondary | ICD-10-CM

## 2019-07-01 DIAGNOSIS — I7782 Antineutrophilic cytoplasmic antibody (ANCA) vasculitis: Secondary | ICD-10-CM

## 2019-07-01 DIAGNOSIS — Z5112 Encounter for antineoplastic immunotherapy: Secondary | ICD-10-CM | POA: Diagnosis not present

## 2019-07-01 DIAGNOSIS — M8580 Other specified disorders of bone density and structure, unspecified site: Secondary | ICD-10-CM | POA: Diagnosis not present

## 2019-07-01 LAB — CMP (CANCER CENTER ONLY)
ALT: 19 U/L (ref 0–44)
AST: 25 U/L (ref 15–41)
Albumin: 3.1 g/dL — ABNORMAL LOW (ref 3.5–5.0)
Alkaline Phosphatase: 65 U/L (ref 38–126)
Anion gap: 9 (ref 5–15)
BUN: 54 mg/dL — ABNORMAL HIGH (ref 8–23)
CO2: 22 mmol/L (ref 22–32)
Calcium: 8.4 mg/dL — ABNORMAL LOW (ref 8.9–10.3)
Chloride: 100 mmol/L (ref 98–111)
Creatinine: 1.4 mg/dL — ABNORMAL HIGH (ref 0.44–1.00)
GFR, Est AFR Am: 41 mL/min — ABNORMAL LOW (ref >60)
GFR, Estimated: 35 mL/min — ABNORMAL LOW (ref >60)
Glucose, Bld: 293 mg/dL — ABNORMAL HIGH (ref 70–99)
Potassium: 4 mmol/L (ref 3.5–5.1)
Sodium: 131 mmol/L — ABNORMAL LOW (ref 135–145)
Total Bilirubin: 0.6 mg/dL (ref 0.3–1.2)
Total Protein: 5.5 g/dL — ABNORMAL LOW (ref 6.5–8.1)

## 2019-07-01 LAB — IRON AND TIBC
Iron: 49 ug/dL (ref 41–142)
Saturation Ratios: 21 % (ref 21–57)
TIBC: 233 ug/dL — ABNORMAL LOW (ref 236–444)
UIBC: 184 ug/dL (ref 120–384)

## 2019-07-01 LAB — CBC WITH DIFFERENTIAL/PLATELET
Abs Immature Granulocytes: 0.05 10*3/uL (ref 0.00–0.07)
Basophils Absolute: 0 10*3/uL (ref 0.0–0.1)
Basophils Relative: 0 %
Eosinophils Absolute: 0 10*3/uL (ref 0.0–0.5)
Eosinophils Relative: 0 %
HCT: 28.4 % — ABNORMAL LOW (ref 36.0–46.0)
Hemoglobin: 9.3 g/dL — ABNORMAL LOW (ref 12.0–15.0)
Immature Granulocytes: 1 %
Lymphocytes Relative: 3 %
Lymphs Abs: 0.2 10*3/uL — ABNORMAL LOW (ref 0.7–4.0)
MCH: 29.9 pg (ref 26.0–34.0)
MCHC: 32.7 g/dL (ref 30.0–36.0)
MCV: 91.3 fL (ref 80.0–100.0)
Monocytes Absolute: 0.1 10*3/uL (ref 0.1–1.0)
Monocytes Relative: 3 %
Neutro Abs: 4.2 10*3/uL (ref 1.7–7.7)
Neutrophils Relative %: 93 %
Platelets: 122 10*3/uL — ABNORMAL LOW (ref 150–400)
RBC: 3.11 MIL/uL — ABNORMAL LOW (ref 3.87–5.11)
RDW: 17.6 % — ABNORMAL HIGH (ref 11.5–15.5)
WBC: 4.6 10*3/uL (ref 4.0–10.5)
nRBC: 0 % (ref 0.0–0.2)

## 2019-07-01 LAB — RETICULOCYTES
Immature Retic Fract: 6.8 % (ref 2.3–15.9)
RBC.: 3.12 MIL/uL — ABNORMAL LOW (ref 3.87–5.11)
Retic Count, Absolute: 80.8 10*3/uL (ref 19.0–186.0)
Retic Ct Pct: 2.6 % (ref 0.4–3.1)

## 2019-07-01 LAB — SAVE SMEAR(SSMR), FOR PROVIDER SLIDE REVIEW

## 2019-07-01 LAB — FERRITIN: Ferritin: 158 ng/mL (ref 11–307)

## 2019-07-01 MED ORDER — DIPHENHYDRAMINE HCL 25 MG PO CAPS
25.0000 mg | ORAL_CAPSULE | Freq: Once | ORAL | Status: AC
  Administered 2019-07-01: 25 mg via ORAL

## 2019-07-01 MED ORDER — DIPHENHYDRAMINE HCL 25 MG PO CAPS
ORAL_CAPSULE | ORAL | Status: AC
  Filled 2019-07-01: qty 1

## 2019-07-01 MED ORDER — ACETAMINOPHEN 325 MG PO TABS
650.0000 mg | ORAL_TABLET | Freq: Once | ORAL | Status: AC
  Administered 2019-07-01: 13:00:00 650 mg via ORAL

## 2019-07-01 MED ORDER — SODIUM CHLORIDE 0.9 % IV SOLN
375.0000 mg/m2 | Freq: Once | INTRAVENOUS | Status: AC
  Administered 2019-07-01: 14:00:00 600 mg via INTRAVENOUS
  Filled 2019-07-01: qty 50

## 2019-07-01 MED ORDER — ACETAMINOPHEN 325 MG PO TABS
ORAL_TABLET | ORAL | Status: AC
  Filled 2019-07-01: qty 2

## 2019-07-01 MED ORDER — HYDRALAZINE HCL 100 MG PO TABS: 100.0000 mg | ORAL_TABLET | Freq: Three times a day (TID) | ORAL | 1 refills | Status: DC

## 2019-07-01 MED ORDER — SODIUM CHLORIDE 0.9 % IV SOLN
Freq: Once | INTRAVENOUS | Status: AC
  Administered 2019-07-01: 13:00:00 via INTRAVENOUS
  Filled 2019-07-01: qty 250

## 2019-07-01 NOTE — Patient Instructions (Signed)
[facility] Discharge Instructions for Patients Receiving Chemotherapy  Today you received the following chemotherapy agents:  Rituximab  To help prevent nausea and vomiting after your treatment, we encourage you to take your nausea medication as prescribed.   If you develop nausea and vomiting that is not controlled by your nausea medication, call the clinic.   BELOW ARE SYMPTOMS THAT SHOULD BE REPORTED IMMEDIATELY:  *FEVER GREATER THAN 100.5 F  *CHILLS WITH OR WITHOUT FEVER  NAUSEA AND VOMITING THAT IS NOT CONTROLLED WITH YOUR NAUSEA MEDICATION  *UNUSUAL SHORTNESS OF BREATH  *UNUSUAL BRUISING OR BLEEDING  TENDERNESS IN MOUTH AND THROAT WITH OR WITHOUT PRESENCE OF ULCERS  *URINARY PROBLEMS  *BOWEL PROBLEMS  UNUSUAL RASH Items with * indicate a potential emergency and should be followed up as soon as possible.  Feel free to call the clinic should you have any questions or concerns. The clinic phone number is [phone].  Please show the [facility] at check-in to the Emergency Department and triage nurse.   Darbepoetin Alfa injection What is this medicine? DARBEPOETIN ALFA (dar be POE e tin AL fa) helps your body make more red blood cells. It is used to treat anemia caused by chronic kidney failure and chemotherapy. This medicine may be used for other purposes; ask your health care provider or pharmacist if you have questions. COMMON BRAND NAME(S): Aranesp What should I tell my health care provider before I take this medicine? They need to know if you have any of these conditions:  blood clotting disorders or history of blood clots  cancer patient not on chemotherapy  cystic fibrosis  heart disease, such as angina, heart failure, or a history of a heart attack  hemoglobin level of 12 g/dL or greater  high blood pressure  low levels of folate, iron, or vitamin B12  seizures  an unusual or allergic reaction to darbepoetin,  erythropoietin, albumin, hamster proteins, latex, other medicines, foods, dyes, or preservatives  pregnant or trying to get pregnant  breast-feeding How should I use this medicine? This medicine is for injection into a vein or under the skin. It is usually given by a health care professional in a hospital or clinic setting. If you get this medicine at home, you will be taught how to prepare and give this medicine. Use exactly as directed. Take your medicine at regular intervals. Do not take your medicine more often than directed. It is important that you put your used needles and syringes in a special sharps container. Do not put them in a trash can. If you do not have a sharps container, call your pharmacist or healthcare provider to get one. A special MedGuide will be given to you by the pharmacist with each prescription and refill. Be sure to read this information carefully each time. Talk to your pediatrician regarding the use of this medicine in children. While this medicine may be used in children as young as 86 month of age for selected conditions, precautions do apply. Overdosage: If you think you have taken too much of this medicine contact a poison control center or emergency room at once. NOTE: This medicine is only for you. Do not share this medicine with others. What if I miss a dose? If you miss a dose, take it as soon as you can. If it is almost time for your next dose, take only that dose. Do not take double or extra doses. What may interact with this medicine? Do not take  this medicine with any of the following medications:  epoetin alfa This list may not describe all possible interactions. Give your health care provider a list of all the medicines, herbs, non-prescription drugs, or dietary supplements you use. Also tell them if you smoke, drink alcohol, or use illegal drugs. Some items may interact with your medicine. What should I watch for while using this medicine? Your  condition will be monitored carefully while you are receiving this medicine. You may need blood work done while you are taking this medicine. This medicine may cause a decrease in vitamin B6. You should make sure that you get enough vitamin B6 while you are taking this medicine. Discuss the foods you eat and the vitamins you take with your health care professional. What side effects may I notice from receiving this medicine? Side effects that you should report to your doctor or health care professional as soon as possible:  allergic reactions like skin rash, itching or hives, swelling of the face, lips, or tongue  breathing problems  changes in vision  chest pain  confusion, trouble speaking or understanding  feeling faint or lightheaded, falls  high blood pressure  muscle aches or pains  pain, swelling, warmth in the leg  rapid weight gain  severe headaches  sudden numbness or weakness of the face, arm or leg  trouble walking, dizziness, loss of balance or coordination  seizures (convulsions)  swelling of the ankles, feet, hands  unusually weak or tired Side effects that usually do not require medical attention (report to your doctor or health care professional if they continue or are bothersome):  diarrhea  fever, chills (flu-like symptoms)  headaches  nausea, vomiting  redness, stinging, or swelling at site where injected This list may not describe all possible side effects. Call your doctor for medical advice about side effects. You may report side effects to FDA at 1-800-FDA-1088. Where should I keep my medicine? Keep out of the reach of children. Store in a refrigerator between 2 and 8 degrees C (36 and 46 degrees F). Do not freeze. Do not shake. Throw away any unused portion if using a single-dose vial. Throw away any unused medicine after the expiration date. NOTE: This sheet is a summary. It may not cover all possible information. If you have questions  about this medicine, talk to your doctor, pharmacist, or health care provider.  2020 Elsevier/Gold Standard (2017-10-10 16:44:20)

## 2019-07-02 ENCOUNTER — Other Ambulatory Visit (HOSPITAL_COMMUNITY): Payer: Self-pay | Admitting: *Deleted

## 2019-07-04 ENCOUNTER — Other Ambulatory Visit: Payer: Self-pay

## 2019-07-04 ENCOUNTER — Ambulatory Visit (INDEPENDENT_AMBULATORY_CARE_PROVIDER_SITE_OTHER): Payer: Medicare HMO | Admitting: Pharmacist

## 2019-07-04 DIAGNOSIS — I4891 Unspecified atrial fibrillation: Secondary | ICD-10-CM | POA: Diagnosis not present

## 2019-07-04 DIAGNOSIS — Z952 Presence of prosthetic heart valve: Secondary | ICD-10-CM | POA: Diagnosis not present

## 2019-07-04 DIAGNOSIS — Z7901 Long term (current) use of anticoagulants: Secondary | ICD-10-CM | POA: Diagnosis not present

## 2019-07-04 LAB — POCT INR: INR: 2.3 (ref 2.0–3.0)

## 2019-07-06 ENCOUNTER — Other Ambulatory Visit: Payer: Self-pay

## 2019-07-06 DIAGNOSIS — E1122 Type 2 diabetes mellitus with diabetic chronic kidney disease: Secondary | ICD-10-CM | POA: Diagnosis not present

## 2019-07-06 DIAGNOSIS — S3992XA Unspecified injury of lower back, initial encounter: Secondary | ICD-10-CM | POA: Diagnosis present

## 2019-07-06 DIAGNOSIS — I2510 Atherosclerotic heart disease of native coronary artery without angina pectoris: Secondary | ICD-10-CM | POA: Insufficient documentation

## 2019-07-06 DIAGNOSIS — S32040A Wedge compression fracture of fourth lumbar vertebra, initial encounter for closed fracture: Secondary | ICD-10-CM | POA: Diagnosis not present

## 2019-07-06 DIAGNOSIS — I509 Heart failure, unspecified: Secondary | ICD-10-CM | POA: Diagnosis not present

## 2019-07-06 DIAGNOSIS — S32048A Other fracture of fourth lumbar vertebra, initial encounter for closed fracture: Secondary | ICD-10-CM | POA: Diagnosis not present

## 2019-07-06 DIAGNOSIS — I5032 Chronic diastolic (congestive) heart failure: Secondary | ICD-10-CM | POA: Insufficient documentation

## 2019-07-06 DIAGNOSIS — Y929 Unspecified place or not applicable: Secondary | ICD-10-CM | POA: Diagnosis not present

## 2019-07-06 DIAGNOSIS — S39012A Strain of muscle, fascia and tendon of lower back, initial encounter: Secondary | ICD-10-CM | POA: Diagnosis not present

## 2019-07-06 DIAGNOSIS — Y999 Unspecified external cause status: Secondary | ICD-10-CM | POA: Insufficient documentation

## 2019-07-06 DIAGNOSIS — Z7984 Long term (current) use of oral hypoglycemic drugs: Secondary | ICD-10-CM | POA: Insufficient documentation

## 2019-07-06 DIAGNOSIS — J8410 Pulmonary fibrosis, unspecified: Secondary | ICD-10-CM | POA: Diagnosis not present

## 2019-07-06 DIAGNOSIS — Z87891 Personal history of nicotine dependence: Secondary | ICD-10-CM | POA: Insufficient documentation

## 2019-07-06 DIAGNOSIS — R918 Other nonspecific abnormal finding of lung field: Secondary | ICD-10-CM | POA: Diagnosis not present

## 2019-07-06 DIAGNOSIS — X500XXA Overexertion from strenuous movement or load, initial encounter: Secondary | ICD-10-CM | POA: Diagnosis not present

## 2019-07-06 DIAGNOSIS — I776 Arteritis, unspecified: Secondary | ICD-10-CM | POA: Diagnosis not present

## 2019-07-06 DIAGNOSIS — Z7952 Long term (current) use of systemic steroids: Secondary | ICD-10-CM | POA: Diagnosis not present

## 2019-07-06 DIAGNOSIS — M5136 Other intervertebral disc degeneration, lumbar region: Secondary | ICD-10-CM | POA: Diagnosis not present

## 2019-07-06 DIAGNOSIS — M545 Low back pain: Secondary | ICD-10-CM | POA: Diagnosis not present

## 2019-07-06 DIAGNOSIS — Y939 Activity, unspecified: Secondary | ICD-10-CM | POA: Insufficient documentation

## 2019-07-06 DIAGNOSIS — R109 Unspecified abdominal pain: Secondary | ICD-10-CM | POA: Diagnosis not present

## 2019-07-06 DIAGNOSIS — Z79899 Other long term (current) drug therapy: Secondary | ICD-10-CM | POA: Insufficient documentation

## 2019-07-06 DIAGNOSIS — I130 Hypertensive heart and chronic kidney disease with heart failure and stage 1 through stage 4 chronic kidney disease, or unspecified chronic kidney disease: Secondary | ICD-10-CM | POA: Diagnosis not present

## 2019-07-06 DIAGNOSIS — N183 Chronic kidney disease, stage 3 (moderate): Secondary | ICD-10-CM | POA: Diagnosis not present

## 2019-07-06 DIAGNOSIS — N3000 Acute cystitis without hematuria: Secondary | ICD-10-CM | POA: Insufficient documentation

## 2019-07-06 NOTE — ED Triage Notes (Signed)
Pt reports 11 days ago she was moving bottle waters and did something to her back. Went to UC today and has compression fracture and advised to go to ED.

## 2019-07-07 ENCOUNTER — Emergency Department (HOSPITAL_COMMUNITY): Payer: Medicare HMO

## 2019-07-07 ENCOUNTER — Other Ambulatory Visit: Payer: Self-pay

## 2019-07-07 ENCOUNTER — Other Ambulatory Visit: Payer: Medicare HMO

## 2019-07-07 ENCOUNTER — Ambulatory Visit: Payer: Medicare HMO | Admitting: Adult Health

## 2019-07-07 ENCOUNTER — Other Ambulatory Visit: Payer: Self-pay | Admitting: *Deleted

## 2019-07-07 ENCOUNTER — Ambulatory Visit: Payer: Medicare HMO

## 2019-07-07 ENCOUNTER — Emergency Department (HOSPITAL_COMMUNITY)
Admission: EM | Admit: 2019-07-07 | Discharge: 2019-07-07 | Disposition: A | Discharge status: Home / Self Care | Payer: Medicare HMO | Attending: Emergency Medicine | Admitting: Emergency Medicine

## 2019-07-07 DIAGNOSIS — T148XXA Other injury of unspecified body region, initial encounter: Secondary | ICD-10-CM

## 2019-07-07 DIAGNOSIS — S32040A Wedge compression fracture of fourth lumbar vertebra, initial encounter for closed fracture: Secondary | ICD-10-CM | POA: Diagnosis not present

## 2019-07-07 DIAGNOSIS — N3000 Acute cystitis without hematuria: Secondary | ICD-10-CM

## 2019-07-07 DIAGNOSIS — M545 Low back pain, unspecified: Secondary | ICD-10-CM

## 2019-07-07 DIAGNOSIS — J8410 Pulmonary fibrosis, unspecified: Secondary | ICD-10-CM | POA: Diagnosis not present

## 2019-07-07 DIAGNOSIS — R918 Other nonspecific abnormal finding of lung field: Secondary | ICD-10-CM | POA: Diagnosis not present

## 2019-07-07 DIAGNOSIS — R109 Unspecified abdominal pain: Secondary | ICD-10-CM | POA: Diagnosis not present

## 2019-07-07 LAB — URINALYSIS, ROUTINE W REFLEX MICROSCOPIC
Bilirubin Urine: NEGATIVE
Glucose, UA: NEGATIVE mg/dL
Hgb urine dipstick: NEGATIVE
Ketones, ur: NEGATIVE mg/dL
Nitrite: NEGATIVE
Protein, ur: >=300 mg/dL — AB
Specific Gravity, Urine: 1.011 (ref 1.005–1.030)
WBC, UA: >50 WBC/hpf — ABNORMAL HIGH (ref 0–5)
pH: 6 (ref 5.0–8.0)

## 2019-07-07 LAB — CBC WITH DIFFERENTIAL/PLATELET
Abs Immature Granulocytes: 0.1 10*3/uL — ABNORMAL HIGH (ref 0.00–0.07)
Basophils Absolute: 0 10*3/uL (ref 0.0–0.1)
Basophils Relative: 0 %
Eosinophils Absolute: 0 10*3/uL (ref 0.0–0.5)
Eosinophils Relative: 1 %
HCT: 31.5 % — ABNORMAL LOW (ref 36.0–46.0)
Hemoglobin: 10.2 g/dL — ABNORMAL LOW (ref 12.0–15.0)
Immature Granulocytes: 3 %
Lymphocytes Relative: 15 %
Lymphs Abs: 0.5 10*3/uL — ABNORMAL LOW (ref 0.7–4.0)
MCH: 30.1 pg (ref 26.0–34.0)
MCHC: 32.4 g/dL (ref 30.0–36.0)
MCV: 92.9 fL (ref 80.0–100.0)
Monocytes Absolute: 0.3 10*3/uL (ref 0.1–1.0)
Monocytes Relative: 8 %
Neutro Abs: 2.7 10*3/uL (ref 1.7–7.7)
Neutrophils Relative %: 73 %
Platelets: 153 10*3/uL (ref 150–400)
RBC: 3.39 MIL/uL — ABNORMAL LOW (ref 3.87–5.11)
RDW: 16.9 % — ABNORMAL HIGH (ref 11.5–15.5)
WBC: 3.6 10*3/uL — ABNORMAL LOW (ref 4.0–10.5)
nRBC: 0 % (ref 0.0–0.2)

## 2019-07-07 LAB — PROTIME-INR
INR: 2.2 — ABNORMAL HIGH (ref 0.8–1.2)
Prothrombin Time: 24.4 seconds — ABNORMAL HIGH (ref 11.4–15.2)

## 2019-07-07 LAB — BASIC METABOLIC PANEL
Anion gap: 13 (ref 5–15)
BUN: 56 mg/dL — ABNORMAL HIGH (ref 8–23)
CO2: 22 mmol/L (ref 22–32)
Calcium: 9.1 mg/dL (ref 8.9–10.3)
Chloride: 101 mmol/L (ref 98–111)
Creatinine, Ser: 1.35 mg/dL — ABNORMAL HIGH (ref 0.44–1.00)
GFR calc Af Amer: 43 mL/min — ABNORMAL LOW (ref >60)
GFR calc non Af Amer: 37 mL/min — ABNORMAL LOW (ref >60)
Glucose, Bld: 101 mg/dL — ABNORMAL HIGH (ref 70–99)
Potassium: 3.5 mmol/L (ref 3.5–5.1)
Sodium: 136 mmol/L (ref 135–145)

## 2019-07-07 LAB — BRAIN NATRIURETIC PEPTIDE: B Natriuretic Peptide: 311.8 pg/mL — ABNORMAL HIGH (ref 0.0–100.0)

## 2019-07-07 MED ORDER — TRAMADOL HCL 50 MG PO TABS: 50.0000 mg | ORAL_TABLET | Freq: Four times a day (QID) | ORAL | 0 refills | Status: DC | PRN

## 2019-07-07 MED ORDER — CEPHALEXIN 500 MG PO CAPS: 500.0000 mg | ORAL_CAPSULE | Freq: Three times a day (TID) | ORAL | 0 refills | Status: DC

## 2019-07-07 MED ORDER — TRAMADOL HCL 50 MG PO TABS
50.0000 mg | ORAL_TABLET | Freq: Once | ORAL | Status: AC
  Administered 2019-07-07: 50 mg via ORAL
  Filled 2019-07-07: qty 1

## 2019-07-07 NOTE — ED Notes (Signed)
Patient transported to CT 

## 2019-07-07 NOTE — ED Provider Notes (Signed)
[facility] DEPT Provider Note   CSN: [id number] Arrival date & time: 07/06/19  1836     History   Chief Complaint Chief Complaint  Patient presents with   Back Pain   sent by UC    HPI [name] is a 78 y.o. female.     Patient to ED for severe back pain for the past 10 days. No fall but she does report pushing heavy water bottle that were sitting on the floor prior to onset of pain. Pain is left lumbar extending to left hip area. She reports the pain was the worst it has been today. It is better when she is supine, at rest, and is sharp and shooting when she moves. She lives alone and usually walks with a walker. She reports she is still able to get up and walk but it is becoming increasingly difficult. No bowel or bladder dysfunction. No urinary frequency or dysuria. No chest pain, SOB, abdominal pain, nausea, vomiting or fever. She was seen at Urgent Care earlier today (9.27.20) and told she has a lumbar compression fracture and urged to go to the emergency department for further evaluation and management.   The history is provided by the patient. No language interpreter was used.  Back Pain Associated symptoms: no abdominal pain, no chest pain, no dysuria, no fever, no numbness and no weakness     Past Medical History:  Diagnosis Date   Aortic atherosclerosis ([place]) 01/23/2018   Atrial fibrillation (HCC)    Carotid artery disease ([place])    Cholelithiasis 01/23/2018   Chronic anticoagulation    Coronary artery disease    status post coronary artery bypass grafting times 07/10/2004   Diabetes mellitus    Hypercholesteremia    Hypertension    Mechanical heart valve present    H. aortic valve replacement at the time of bypass surgery October 2005   Moderate to severe pulmonary hypertension ([place])    Peripheral arterial disease ([place])    history of left common iliac artery PTA and stenting for a chronic total occlusion 08/26/01     Patient Active Problem List   Diagnosis Date Noted   Pancytopenia ([place]) 05/28/2019   Dyspnea 05/28/2019   Vasculitis, ANCA positive ([place]) 05/28/2019   Renal insufficiency 05/08/2019   Acute on chronic diastolic CHF (congestive heart failure) ([place]) 04/07/2019   Left leg cellulitis 04/07/2019   PAF (paroxysmal atrial fibrillation) ([place]) 04/07/2019   Iron deficiency anemia 02/28/2019   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 ([place]) 02/28/2019   CKD (chronic kidney disease) stage 3, GFR 30-59 ml/min (HCC) 02/24/2019   Malnutrition of moderate degree 03/12/2018   Acute on chronic diastolic heart failure ([place]) 03/06/2018   Diabetes mellitus type 2, controlled ([place]) 03/06/2018   S/P cholecystectomy 03/06/2018   HTN (hypertension) 03/06/2018   H/O mechanical aortic valve replacement 03/06/2018   Acute hyponatremia 03/06/2018   Acute respiratory failure with hypoxia ([place]) 03/06/2018   Moderate to severe pulmonary hypertension ([place]) 03/06/2018   UTI (urinary tract infection) 02/22/2018   Cholecystitis 02/21/2018   GERD (gastroesophageal reflux disease) 02/21/2018   Anxiety 02/21/2018   Chronic diastolic CHF (congestive heart failure) ([place]) 02/21/2018   Diarrhea 02/21/2018   Rash 02/21/2018   General weakness 02/02/2018   Macular rash 01/25/2018   Thrombocytopenia ([place]) 01/25/2018   Arm pain, diffuse 01/25/2018   Anemia 01/23/2018   Cholelithiasis 01/23/2018   Aortic atherosclerosis ([place]) 01/23/2018   GI bleed 01/18/2018   Coronary artery  disease 08/18/2013   Carotid artery disease ([place]) 08/18/2013   Peripheral arterial disease ([place]) 08/18/2013   Diabetes mellitus without complication ([place]) 74/25/9563   Benign positional vertigo 10/06/2011   Hyponatremia 10/06/2011   Influenza 10/06/2011   Sinusitis 10/06/2011   Hypertension    Hypercholesteremia    Mechanical heart valve present    Chronic anticoagulation      Past Surgical History:  Procedure Laterality Date   AORTIC VALVE REPLACEMENT     CARDIAC CATHETERIZATION  11/10/2004   40% right common illiac, 70% in stent restenosis of distal left common illiac,    CARDIAC CATHETERIZATION  05/18/2004   LAD 50-70% midstenosis, RCA dominant w/50% stenosis, 50% Right common Illiac artery ostial stenosis, 90% in stent restenosis within midportion of left common illiac stent   Carotid Duplex  03/12/2012   RSA-elev. velocities suggestive of a 50-69% diameter reduction, Right&Left Bulb/Prox ICA-mild-mod.fibrous plaqueelevating Velocities abnormal study.   CHOLECYSTECTOMY N/A 03/01/2018   Procedure: LAPAROSCOPIC CHOLECYSTECTOMY;  Surgeon: Judeth Horn, MD;  Location: [facility];  Service: General;  Laterality: N/A;   COLONOSCOPY WITH PROPOFOL N/A 01/22/2018   Procedure: COLONOSCOPY WITH PROPOFOL;  Surgeon: Wilford Corner, MD;  Location: [facility];  Service: Endoscopy;  Laterality: N/A;   ESOPHAGOGASTRODUODENOSCOPY (EGD) WITH PROPOFOL N/A 01/22/2018   Procedure: ESOPHAGOGASTRODUODENOSCOPY (EGD) WITH PROPOFOL;  Surgeon: Wilford Corner, MD;  Location: [facility];  Service: Endoscopy;  Laterality: N/A;   ESOPHAGOGASTRODUODENOSCOPY (EGD) WITH PROPOFOL N/A 11/21/2018   Procedure: ESOPHAGOGASTRODUODENOSCOPY (EGD) WITH PROPOFOL;  Surgeon: [name], MD;  Location: [facility];  Service: Gastroenterology;  Laterality: N/A;   ESOPHAGOGASTRODUODENOSCOPY (EGD) WITH PROPOFOL Left 02/27/2019   Procedure: ESOPHAGOGASTRODUODENOSCOPY (EGD) WITH PROPOFOL;  Surgeon: Arta Silence, MD;  [facility] END[facility]COPY;  Service: Endoscopy;  Laterality: Left;   GIVENS CAPSULE STUDY N/A 11/21/2018   Procedure: GIVENS CAPSULE STUDY;  Surgeon: [name], MD;  Location: [facility];  Service: Gastroenterology;  Laterality: N/A;  To be deployed during EGD   Lower Ext. Duplex  03/12/2012   Right Proximal CIA- vessel narrowing w/elevated velocities 0-49% diameter reduction.  Right SFA-mild mixed density plaque throughout vessel.   NM MYOCAR PERF WALL MOTION  05/19/2010   protocol: Persantine, post stress EF 65%, negative for ischemia, low risk scan   RIGHT HEART CATH N/A 06/27/2018   Procedure: RIGHT HEART CATH;  Surgeon: [name], MD;  Location: [facility];  Service: Cardiovascular;  Laterality: N/A;   TRANSTHORACIC ECHOCARDIOGRAM  08/29/2012   Moderately calcified annulus of mitral valve, moderate regurg. of both mitral valve and tricuspid valve.      OB History   No obstetric history on file.      Home Medications    Prior to Admission medications   Medication Sig Start Date End Date Taking? Authorizing Provider  acetaminophen (TYLENOL) 325 MG tablet Take 325 mg by mouth every 6 (six) hours as needed for mild pain.     [provider]  allopurinol (ZYLOPRIM) 100 MG tablet Take 100 mg by mouth daily.    [provider]  ALPRAZolam Duanne Moron) 0.5 MG tablet Take 0.25 mg by mouth at bedtime.     [provider]  atorvastatin (LIPITOR) 80 MG tablet Take 1 tablet (80 mg total) by mouth daily at 6 PM. 11/22/18   [name], MD  Cholecalciferol (VITAMIN D3) 1000 UNITS CAPS Take 2,000 Units by mouth daily with lunch.     [provider]  cloNIDine (CATAPRES) 0.1 MG tablet Take 1 tablet (0.1 mg  total) by mouth 2 (two) times daily. 05/31/19 07/30/19  Cherylann Ratel A, DO  Cyanocobalamin (VITAMIN B12) 1000 MCG TBCR Take 1,000 mcg by mouth daily with lunch.     [provider]  feeding supplement, GLUCERNA SHAKE, (GLUCERNA SHAKE) LIQD Take 237 mLs by mouth 2 (two) times daily between meals. 04/07/19   [name], MD  furosemide (LASIX) 40 MG tablet Take 1.5 tablets (60 mg total) by mouth daily. 05/22/19   [name], MD  hydrALAZINE (APRESOLINE) 100 MG tablet Take 1 tablet (100 mg total) by mouth 3 (three) times daily. 07/01/19   [name], MD  magnesium oxide (MAG-OX) 400 (241.3 Mg) MG tablet  Take 1 tablet (400 mg total) by mouth daily. 01/30/18   [name], MD  metFORMIN (GLUCOPHAGE) 500 MG tablet Take 500 mg by mouth 2 (two) times daily with a meal.     [provider]  metoprolol tartrate (LOPRESSOR) 25 MG tablet Take 0.5 tablets (12.5 mg total) by mouth 2 (two) times daily. 03/05/19   [place] Eric J, DO  ondansetron (ZOFRAN ODT) 4 MG disintegrating tablet Take 1 tablet (4 mg total) by mouth every 8 (eight) hours as needed for nausea or vomiting. 03/09/19   [name], Kaitlyn E, PA-C  pantoprazole (PROTONIX) 40 MG tablet Take 40 mg by mouth 2 (two) times daily.  12/05/14   [provider]  predniSONE (DELTASONE) 20 MG tablet Take 2 tablets (40 mg total) by mouth daily with breakfast. 06/10/19 07/10/19  Magrinat, [name], MD  spironolactone (ALDACTONE) 25 MG tablet Take 25 mg by mouth daily.    [provider]  tadalafil, PAH, (ADCIRCA) 20 MG tablet Take 2 tablets (40 mg total) by mouth daily. 05/15/19   [name], MD  vitamin C (ASCORBIC ACID) 500 MG tablet Take 500 mg by mouth daily with lunch.    [provider]  warfarin (COUMADIN) 5 MG tablet Take 5-7.5 mg by mouth daily. Take 1.5 tablets on Tues, Thursday, Saturday and Sunday. Take 1 tablet on Mondays, Wednesdays, and Fridays. 04/26/19   [provider]    Family History Family History  Problem Relation Age of Onset   Breast cancer Neg Hx     Social History Social History   Tobacco Use   Smoking status: Former Smoker    Packs/day: 1.00    Years: 30.00    Pack years: 30.00    Types: Cigarettes   Smokeless tobacco: Never Used   Tobacco comment: quit smoking 2005  Substance Use Topics   Alcohol use: No    Alcohol/week: 0.0 standard drinks   Drug use: No     Allergies   Flagyl [metronidazole], Coreg [carvedilol], Losartan, Verapamil, Zetia [ezetimibe], and Zocor [simvastatin - high dose]   Review of Systems Review of Systems  Constitutional: Negative  for fever.  HENT: Negative for congestion.   Respiratory: Negative for cough and shortness of breath.   Cardiovascular: Negative for chest pain.  Gastrointestinal: Negative for abdominal pain and nausea.  Genitourinary: Negative for dysuria, enuresis, flank pain and frequency.  Musculoskeletal: Positive for back pain.  Neurological: Negative for weakness and numbness.     Physical Exam Updated Vital Signs BP (!) 176/51    Pulse 68    Temp 98.4 F (36.9 C) (Oral)    Resp 17    SpO2 99%   Physical Exam Constitutional:      Appearance: She is well-developed.  Neck:     Musculoskeletal: Normal  range of motion.  Cardiovascular:     Rate and Rhythm: Normal rate.  Pulmonary:     Effort: Pulmonary effort is normal.  Chest:     Chest wall: No tenderness.  Abdominal:     General: There is no distension.     Palpations: Abdomen is soft.     Tenderness: There is no abdominal tenderness.  Musculoskeletal: Normal range of motion.       Back:     Right lower leg: Edema present.     Left lower leg: Edema present.     Comments: Tenderness that is focal to the left paralumbar back. No midline tenderness. No swelling or discoloration. No hip tenderness or ROM limitation.  Skin:    General: Skin is warm and dry.     Coloration: Skin is not pale.     Findings: No erythema.  Neurological:     Mental Status: She is alert and oriented to person, place, and time.     Sensory: No sensory deficit.     Deep Tendon Reflexes: Reflexes normal.      ED Treatments / Results  Labs (all labs ordered are listed, but only abnormal results are displayed) Labs Reviewed - No data to display  EKG None  Radiology No results found.  Procedures Procedures (including critical care time)  Medications Ordered in ED Medications - No data to display   Initial Impression / Assessment and Plan / ED Course  I have reviewed the triage vital signs and the nursing notes.  Pertinent labs & imaging  results that were available during my care of the patient were reviewed by me and considered in my medical decision making (see chart for details).        Patient to ED with back pain x 10 days, worst pain today. Seen at Urgent Care and found to have an age indeterminate L-4 compression fracture and sent here for pain control.   She has bilateral LE edema of about 2+ pitting. She states this is normal for her. She also is undergoing treatment for anemia and thrombocytopenia and reports multiple recent admissions for transfusions. She is being treated with Aranesp, prednisone and rituximab by Dr. Jana Hakim. There is no conjunctival pallor today, no SOB or CP as with previous presentations with anemia.   CT renal, and CT lumbar view are positive for chronic L4 endplate fracture without acute findings. [name] back pain follows the pattern of muscular spasm which correlates with probable strain injury after moving heavy objects prior to onset of pain. She does have incidental findings of a UTI. No fever, vomiting.   She has been ambulatory in the department and is steady. She is able to get up from bed independently. Tramadol provided in the ED with some relief of pain. She has been able to rest well. She states she feels very comfortable with treating her pain at home and has someone nearby that helps with her daily activities when needed.   Will treat UTI with Keflex and provide Rx Tramadol for home. Encouraged close follow up with Dr. Osborne Casco for recheck on her progress.   Final Clinical Impressions(s) / ED Diagnoses   Final diagnoses:  None   1. Musculoskeletal back pain 2. UTI  ED Discharge Orders    None       Charlann Lange, Hershal Coria 07/07/19 1497    Ezequiel Essex, MD 07/08/19 1640

## 2019-07-07 NOTE — Discharge Instructions (Addendum)
Follow up with Dr. Osborne Casco for recheck of back pain and urinary tract infection this week. If you develop worsening symptoms, please return to the emergency department for further evaluation.   Take Ultram for pain and keflex for the urinary tract infection. Rest the back, apply warm compresses as instructed.

## 2019-07-07 NOTE — ED Notes (Signed)
Pt denied pain while laying in the bed.  This Probation officer and EMT ambulated Pt to the restroom.  Pt c/o low back pain with ambulation, but was able to walk to restroom w/o much difficulty.

## 2019-07-07 NOTE — Patient Outreach (Signed)
[facility]) Care Management  07/07/2019  [name] 1941-03-20 [id number]   Notified that member was seen in the ED yesterday for low back pain.  Call placed to member, phone rang busy twice, unable to leave a voice message.  Will follow up within the next 3-4 business days.  [name], [place], MSN [facility] [phone]

## 2019-07-07 NOTE — ED Notes (Signed)
Per EMT, Pt had no difficulty ambulating back to room.

## 2019-07-08 ENCOUNTER — Inpatient Hospital Stay: Payer: Medicare HMO

## 2019-07-08 ENCOUNTER — Inpatient Hospital Stay (HOSPITAL_BASED_OUTPATIENT_CLINIC_OR_DEPARTMENT_OTHER): Payer: Medicare HMO | Admitting: Adult Health

## 2019-07-08 ENCOUNTER — Inpatient Hospital Stay (HOSPITAL_BASED_OUTPATIENT_CLINIC_OR_DEPARTMENT_OTHER): Payer: Medicare HMO | Admitting: Medical

## 2019-07-08 ENCOUNTER — Other Ambulatory Visit: Payer: Self-pay

## 2019-07-08 ENCOUNTER — Encounter: Payer: Self-pay | Admitting: Adult Health

## 2019-07-08 ENCOUNTER — Other Ambulatory Visit: Payer: Self-pay | Admitting: Medical

## 2019-07-08 VITALS — BP 127/72 | HR 71 | Temp 97.5°F | Resp 18

## 2019-07-08 VITALS — BP 183/86 | HR 55 | Temp 98.3°F | Resp 18 | Ht 62.0 in | Wt 125.6 lb

## 2019-07-08 DIAGNOSIS — K648 Other hemorrhoids: Secondary | ICD-10-CM | POA: Diagnosis not present

## 2019-07-08 DIAGNOSIS — I10 Essential (primary) hypertension: Secondary | ICD-10-CM

## 2019-07-08 DIAGNOSIS — D500 Iron deficiency anemia secondary to blood loss (chronic): Secondary | ICD-10-CM

## 2019-07-08 DIAGNOSIS — D696 Thrombocytopenia, unspecified: Secondary | ICD-10-CM

## 2019-07-08 DIAGNOSIS — I700 Atherosclerosis of aorta: Secondary | ICD-10-CM

## 2019-07-08 DIAGNOSIS — I159 Secondary hypertension, unspecified: Secondary | ICD-10-CM

## 2019-07-08 DIAGNOSIS — D631 Anemia in chronic kidney disease: Secondary | ICD-10-CM | POA: Diagnosis not present

## 2019-07-08 DIAGNOSIS — I776 Arteritis, unspecified: Secondary | ICD-10-CM | POA: Diagnosis not present

## 2019-07-08 DIAGNOSIS — D594 Other nonautoimmune hemolytic anemias: Secondary | ICD-10-CM

## 2019-07-08 DIAGNOSIS — I4891 Unspecified atrial fibrillation: Secondary | ICD-10-CM | POA: Diagnosis not present

## 2019-07-08 DIAGNOSIS — N189 Chronic kidney disease, unspecified: Secondary | ICD-10-CM | POA: Diagnosis not present

## 2019-07-08 DIAGNOSIS — D61818 Other pancytopenia: Secondary | ICD-10-CM

## 2019-07-08 DIAGNOSIS — I7782 Antineutrophilic cytoplasmic antibody (ANCA) vasculitis: Secondary | ICD-10-CM

## 2019-07-08 DIAGNOSIS — I739 Peripheral vascular disease, unspecified: Secondary | ICD-10-CM

## 2019-07-08 DIAGNOSIS — D508 Other iron deficiency anemias: Secondary | ICD-10-CM

## 2019-07-08 DIAGNOSIS — Z5112 Encounter for antineoplastic immunotherapy: Secondary | ICD-10-CM | POA: Diagnosis not present

## 2019-07-08 DIAGNOSIS — M8580 Other specified disorders of bone density and structure, unspecified site: Secondary | ICD-10-CM | POA: Diagnosis not present

## 2019-07-08 LAB — CMP (CANCER CENTER ONLY)
ALT: 16 U/L (ref 0–44)
AST: 22 U/L (ref 15–41)
Albumin: 3.1 g/dL — ABNORMAL LOW (ref 3.5–5.0)
Alkaline Phosphatase: 73 U/L (ref 38–126)
Anion gap: 12 (ref 5–15)
BUN: 48 mg/dL — ABNORMAL HIGH (ref 8–23)
CO2: 24 mmol/L (ref 22–32)
Calcium: 8.5 mg/dL — ABNORMAL LOW (ref 8.9–10.3)
Chloride: 100 mmol/L (ref 98–111)
Creatinine: 1.5 mg/dL — ABNORMAL HIGH (ref 0.44–1.00)
GFR, Est AFR Am: 38 mL/min — ABNORMAL LOW (ref >60)
GFR, Estimated: 33 mL/min — ABNORMAL LOW (ref >60)
Glucose, Bld: 222 mg/dL — ABNORMAL HIGH (ref 70–99)
Potassium: 3.7 mmol/L (ref 3.5–5.1)
Sodium: 136 mmol/L (ref 135–145)
Total Bilirubin: 0.6 mg/dL (ref 0.3–1.2)
Total Protein: 5.5 g/dL — ABNORMAL LOW (ref 6.5–8.1)

## 2019-07-08 LAB — CBC WITH DIFFERENTIAL/PLATELET
Abs Immature Granulocytes: 0.2 10*3/uL — ABNORMAL HIGH (ref 0.00–0.07)
Basophils Absolute: 0 10*3/uL (ref 0.0–0.1)
Basophils Relative: 1 %
Eosinophils Absolute: 0 10*3/uL (ref 0.0–0.5)
Eosinophils Relative: 1 %
HCT: 27.7 % — ABNORMAL LOW (ref 36.0–46.0)
Hemoglobin: 9.2 g/dL — ABNORMAL LOW (ref 12.0–15.0)
Immature Granulocytes: 5 %
Lymphocytes Relative: 13 %
Lymphs Abs: 0.6 10*3/uL — ABNORMAL LOW (ref 0.7–4.0)
MCH: 30.3 pg (ref 26.0–34.0)
MCHC: 33.2 g/dL (ref 30.0–36.0)
MCV: 91.1 fL (ref 80.0–100.0)
Monocytes Absolute: 0.4 10*3/uL (ref 0.1–1.0)
Monocytes Relative: 8 %
Neutro Abs: 3.1 10*3/uL (ref 1.7–7.7)
Neutrophils Relative %: 72 %
Platelets: 139 10*3/uL — ABNORMAL LOW (ref 150–400)
RBC: 3.04 MIL/uL — ABNORMAL LOW (ref 3.87–5.11)
RDW: 16.9 % — ABNORMAL HIGH (ref 11.5–15.5)
WBC: 4.2 10*3/uL (ref 4.0–10.5)
nRBC: 0 % (ref 0.0–0.2)

## 2019-07-08 LAB — RETICULOCYTES
Immature Retic Fract: 10.4 % (ref 2.3–15.9)
RBC.: 3.14 MIL/uL — ABNORMAL LOW (ref 3.87–5.11)
Retic Count, Absolute: 65.9 10*3/uL (ref 19.0–186.0)
Retic Ct Pct: 2.1 % (ref 0.4–3.1)

## 2019-07-08 LAB — IRON AND TIBC
Iron: 43 ug/dL (ref 41–142)
Saturation Ratios: 19 % — ABNORMAL LOW (ref 21–57)
TIBC: 224 ug/dL — ABNORMAL LOW (ref 236–444)
UIBC: 181 ug/dL (ref 120–384)

## 2019-07-08 LAB — URIC ACID: Uric Acid, Serum: 5.6 mg/dL (ref 2.5–7.1)

## 2019-07-08 LAB — FERRITIN: Ferritin: 147 ng/mL (ref 11–307)

## 2019-07-08 LAB — SAVE SMEAR(SSMR), FOR PROVIDER SLIDE REVIEW

## 2019-07-08 MED ORDER — SODIUM CHLORIDE 0.9 % IV SOLN
375.0000 mg/m2 | Freq: Once | INTRAVENOUS | Status: AC
  Administered 2019-07-08: 600 mg via INTRAVENOUS
  Filled 2019-07-08: qty 10

## 2019-07-08 MED ORDER — ACETAMINOPHEN 325 MG PO TABS
650.0000 mg | ORAL_TABLET | Freq: Once | ORAL | Status: AC
  Administered 2019-07-08: 650 mg via ORAL

## 2019-07-08 MED ORDER — CLONIDINE HCL 0.1 MG PO TABS
ORAL_TABLET | ORAL | Status: AC
  Filled 2019-07-08: qty 1

## 2019-07-08 MED ORDER — SODIUM CHLORIDE 0.9 % IV SOLN
Freq: Once | INTRAVENOUS | Status: AC
  Administered 2019-07-08: 10:00:00 via INTRAVENOUS
  Filled 2019-07-08: qty 250

## 2019-07-08 MED ORDER — CLONIDINE HCL 0.1 MG PO TABS
0.1000 mg | ORAL_TABLET | Freq: Once | ORAL | Status: AC
  Administered 2019-07-08: 0.1 mg via ORAL

## 2019-07-08 MED ORDER — ACETAMINOPHEN 325 MG PO TABS
ORAL_TABLET | ORAL | Status: AC
  Filled 2019-07-08: qty 2

## 2019-07-08 MED ORDER — DARBEPOETIN ALFA 200 MCG/0.4ML IJ SOSY
PREFILLED_SYRINGE | INTRAMUSCULAR | Status: AC
  Filled 2019-07-08: qty 0.4

## 2019-07-08 MED ORDER — CLONIDINE HCL 0.1 MG PO TABS: 0.1000 mg | ORAL_TABLET | Freq: Once | ORAL | Status: DC

## 2019-07-08 MED ORDER — DIPHENHYDRAMINE HCL 25 MG PO CAPS
25.0000 mg | ORAL_CAPSULE | Freq: Once | ORAL | Status: AC
  Administered 2019-07-08: 25 mg via ORAL

## 2019-07-08 MED ORDER — DARBEPOETIN ALFA 200 MCG/0.4ML IJ SOSY
200.0000 ug | PREFILLED_SYRINGE | Freq: Once | INTRAMUSCULAR | Status: AC
  Administered 2019-07-08: 200 ug via SUBCUTANEOUS

## 2019-07-08 MED ORDER — DIPHENHYDRAMINE HCL 25 MG PO CAPS
ORAL_CAPSULE | ORAL | Status: AC
  Filled 2019-07-08: qty 1

## 2019-07-08 NOTE — Progress Notes (Signed)
[place]  [phone] [phone]    ID: [name] DOB: 07-06-1941  MR#: [id number]  HYW#:[id number]  Patient Care Team: [name], MD as PCP - General (Internal Medicine) [name], MD as PCP - Advanced Heart Failure (Cardiology) Lorretta Harp, MD as PCP - Cardiology (Cardiology) [name], RN as [place] Management Magrinat, [name], MD as Consulting Physician (Hematology and Oncology) Arta Silence, MD as Consulting Physician (Gastroenterology) [name], MD as Consulting Physician (Cardiology) Marzetta Board, DPM as Consulting Physician (Podiatry) Gavin Pound, MD as Consulting Physician (Rheumatology) [name], [facility] as [place] Management (Pharmacist) OTHER MD:   CHIEF COMPLAINT: anemia and thrombocytopenia  CURRENT TREATMENT: [Feraheme], Aranesp, prednisone, rituximab   INTERVAL HISTORY: [name] returns today for follow-up and treatment of her anemia and thrombocytopenia. She was last seen here on   She previously received Feraheme for her iron deficiency, with the last dose 05/12/2019.  Her most recent ferritin on 06/20/2019 was 158, down from 466 in August (but recall ferritin is an acute phase reactant.)  She received Aranesp 05/05/2019, 05/19/2019 and 06/12/2019.    She will resume Aranesp today and we will repeat that every 2 weeks, the goal being hemoglobin greater than 10.  She was started on rituximab with a test dose 06/05/2019, first full dose 06/12/2019.  She   She is currently on prednisone 20 mg daily for her vasculitis.  REVIEW OF SYSTEMS: [name] was seen in the ER two nights ago because she sprained her hip.  She was also noted to have a UTI and chest xray was concerning for pulmonary edema.  She was called in Tramadol for the hip pain and Keflex for the UTI and she has not yet picked these scripts up.  She notes that her BP has  been more elevated recently, and that she hasn't called Dr. Aundra Dubin as recommended by the ER.  She denies any cough, shortness of breath, DOE, orthopnea, chest pain, palpitations.  She says that she weighs herself daily and it has been stable.    [name]'s hemoglobin is improved.  She says she tolerates treatment well.  She has no rash or skin change.  She has no fevers, chills, bowel/bladder changes, or urinary symptoms.  A detailed ROS was otherwise non contributory.     HISTORY OF CURRENT ILLNESS: From the original consult note:  [name] is an 78 y.o. female from [place], [place].  She has a past medical history significant forPAD s/p stenting; moderate to severe pulmonary HTN; afib and AVR on Coumadin; HTN; HLD; DM; and CAD s/p CABG. the patient was admitted to the hospital for anemia.  She was having increasing fatigue and shortness of breath and had routine lab work drawn which showed a hemoglobin of 6.6.  Due to her abnormal lab work, she was advised to come to the emergency room for admission.  She was also admitted in February 2020 for symptomatic anemia.  She reports she has been anemic for at least a year and has been taking oral iron.  She underwent a colonoscopy and EGD on 01/22/2018.  The colonoscopy showed diverticulosis in the sigmoid colon and descending colon, internal hemorrhoids.  The upper endoscopy was normal except for erythematous mucosa in the antrum.  She had a repeat EGD on 11/21/2018 that showed a few minimally oozing( with scope trauma) superficial gastric ulcers with pigmented material were found in the gastric body.  The largest lesion was 4 mm in largest dimension.  She underwent another upper endoscopy on 02/27/2019 which showed patchy mildly friable mucosa with contact bleeding was found in the gastric fundus, in the gastric body and in the gastric antrum.  The patient is maintained on warfarin due to A. fib and a mechanical aortic valve.  Review of the  patient's chart shows that she has been anemic dating back to at least December thousand 12 (these are the earliest labs available to me).  Her highest hemoglobin was 11.6 in December 2012.  I also note that the patient has had intermittent mild leukopenia adding back to at least April 2019.  Her lowest white blood cell count was 2.6 in April 2019.  Since admission, the patient has received 1 unit of packed red blood cells.  She is currently on heparin for her procedure with plan to bridge back to Coumadin.  Stool for occult blood was negative x1 on 02/24/2019.  Hematology was asked see the patient to make recommendations regarding her anemia.  [name] subsequent history is as detailed below.   PAST MEDICAL HISTORY: Past Medical History:  Diagnosis Date   Aortic atherosclerosis ([place]) 01/23/2018   Atrial fibrillation (HCC)    Carotid artery disease ([place])    Cholelithiasis 01/23/2018   Chronic anticoagulation    Coronary artery disease    status post coronary artery bypass grafting times 07/10/2004   Diabetes mellitus    Hypercholesteremia    Hypertension    Mechanical heart valve present    H. aortic valve replacement at the time of bypass surgery October 2005   Moderate to severe pulmonary hypertension ([place])    Peripheral arterial disease ([place])    history of left common iliac artery PTA and stenting for a chronic total occlusion 08/26/01     PAST SURGICAL HISTORY: Past Surgical History:  Procedure Laterality Date   AORTIC VALVE REPLACEMENT     CARDIAC CATHETERIZATION  11/10/2004   40% right common illiac, 70% in stent restenosis of distal left common illiac,    CARDIAC CATHETERIZATION  05/18/2004   LAD 50-70% midstenosis, RCA dominant w/50% stenosis, 50% Right common Illiac artery ostial stenosis, 90% in stent restenosis within midportion of left common illiac stent   Carotid Duplex  03/12/2012   RSA-elev. velocities suggestive of a 50-69% diameter reduction,  Right&Left Bulb/Prox ICA-mild-mod.fibrous plaqueelevating Velocities abnormal study.   CHOLECYSTECTOMY N/A 03/01/2018   Procedure: LAPAROSCOPIC CHOLECYSTECTOMY;  Surgeon: Judeth Horn, MD;  Location: [facility];  Service: General;  Laterality: N/A;   COLONOSCOPY WITH PROPOFOL N/A 01/22/2018   Procedure: COLONOSCOPY WITH PROPOFOL;  Surgeon: Wilford Corner, MD;  Location: [facility];  Service: Endoscopy;  Laterality: N/A;   ESOPHAGOGASTRODUODENOSCOPY (EGD) WITH PROPOFOL N/A 01/22/2018   Procedure: ESOPHAGOGASTRODUODENOSCOPY (EGD) WITH PROPOFOL;  Surgeon: Wilford Corner, MD;  Location: [facility];  Service: Endoscopy;  Laterality: N/A;   ESOPHAGOGASTRODUODENOSCOPY (EGD) WITH PROPOFOL N/A 11/21/2018   Procedure: ESOPHAGOGASTRODUODENOSCOPY (EGD) WITH PROPOFOL;  Surgeon: [name], MD;  Location: [facility];  Service: Gastroenterology;  Laterality: N/A;   ESOPHAGOGASTRODUODENOSCOPY (EGD) WITH PROPOFOL Left 02/27/2019   Procedure: ESOPHAGOGASTRODUODENOSCOPY (EGD) WITH PROPOFOL;  Surgeon: Arta Silence, MD;  Location: [facility] ENDOSCOPY;  Service: Endoscopy;  Laterality: Left;   GIVENS CAPSULE STUDY N/A 11/21/2018   Procedure: GIVENS CAPSULE STUDY;  Surgeon: [name], MD;  Location: [facility];  Service: Gastroenterology;  Laterality: N/A;  To be deployed during EGD   Lower Ext. Duplex  03/12/2012   Right Proximal CIA- vessel  narrowing w/elevated velocities 0-49% diameter reduction. Right SFA-mild mixed density plaque throughout vessel.   NM MYOCAR PERF WALL MOTION  05/19/2010   protocol: Persantine, post stress EF 65%, negative for ischemia, low risk scan   RIGHT HEART CATH N/A 06/27/2018   Procedure: RIGHT HEART CATH;  Surgeon: [name], MD;  Location: [facility];  Service: Cardiovascular;  Laterality: N/A;   TRANSTHORACIC ECHOCARDIOGRAM  08/29/2012   Moderately calcified annulus of mitral valve, moderate regurg. of both mitral valve and tricuspid valve.     FAMILY  HISTORY: Family History  Problem Relation Age of Onset   Breast cancer Neg Hx     GYNECOLOGIC HISTORY:  No LMP recorded. Patient has had a hysterectomy. Menarche:  years old Age at first live birth:  [place] P: 3 LMP:  Contraceptive:  HRT:   Hysterectomy?: yes BSO?:    SOCIAL HISTORY: (Current as of 03/07/2019) [name] is widowed.  She lives by herself.  She has 3 children that live locally.  She smoked three quarters of a pack of cigarettes daily for 50 years.  She quit in 2005.  Denies alcohol use.   ADVANCED DIRECTIVES: Not in place    HEALTH MAINTENANCE: Social History   Tobacco Use   Smoking status: Former Smoker    Packs/day: 1.00    Years: 30.00    Pack years: 30.00    Types: Cigarettes   Smokeless tobacco: Never Used   Tobacco comment: quit smoking 2005  Substance Use Topics   Alcohol use: No    Alcohol/week: 0.0 standard drinks   Drug use: No    Colonoscopy: EGD on 02/27/2019 under Dr. Paulita Fujita found some friable gastric mucosa.  Colonoscopy under Dr. Michail Sermon 01/22/2018 showed significant diverticular disease  PAP:   Bone density:  Mammography: 02/25/2017, breast density category B.  Allergies  Allergen Reactions   Flagyl [Metronidazole] Rash    ALL-OVER BODY RASH   Coreg [Carvedilol] Other (See Comments)    Terrible cramping in the feet and had a lot of bowel movements, but not diarrhea   Losartan Swelling    Patient doesn't recall site of swelling   Verapamil Hives   Zetia [Ezetimibe] Other (See Comments)    Reaction not recalled   Zocor [Simvastatin - High Dose] Other (See Comments)    Reaction not recalled    Current Outpatient Medications  Medication Sig Dispense Refill   acetaminophen (TYLENOL) 325 MG tablet Take 650 mg by mouth every 6 (six) hours as needed for mild pain.      allopurinol (ZYLOPRIM) 100 MG tablet Take 100 mg by mouth daily.     ALPRAZolam (XANAX) 0.5 MG tablet Take 0.25 mg by mouth at bedtime.      atorvastatin  (LIPITOR) 80 MG tablet Take 1 tablet (80 mg total) by mouth daily at 6 PM.     cephALEXin (KEFLEX) 500 MG capsule Take 1 capsule (500 mg total) by mouth 3 (three) times daily. 15 capsule 0   Cholecalciferol (VITAMIN D3) 1000 UNITS CAPS Take 2,000 Units by mouth daily with lunch.      cloNIDine (CATAPRES) 0.1 MG tablet Take 1 tablet (0.1 mg total) by mouth 2 (two) times daily. 60 tablet 1   Cyanocobalamin (VITAMIN B12) 1000 MCG TBCR Take 1,000 mcg by mouth daily with lunch.      feeding supplement, GLUCERNA SHAKE, (GLUCERNA SHAKE) LIQD Take 237 mLs by mouth 2 (two) times daily between meals. 237 mL 30   furosemide (LASIX) 40  MG tablet Take 1.5 tablets (60 mg total) by mouth daily. 135 tablet 6   hydrALAZINE (APRESOLINE) 100 MG tablet Take 1 tablet (100 mg total) by mouth 3 (three) times daily. 270 tablet 1   Magnesium 500 MG TABS Take 500 mg by mouth daily.     magnesium oxide (MAG-OX) 400 (241.3 Mg) MG tablet Take 1 tablet (400 mg total) by mouth daily.     metFORMIN (GLUCOPHAGE) 500 MG tablet Take 500 mg by mouth 2 (two) times daily with a meal.      metoprolol tartrate (LOPRESSOR) 25 MG tablet Take 0.5 tablets (12.5 mg total) by mouth 2 (two) times daily. 60 tablet 0   ondansetron (ZOFRAN ODT) 4 MG disintegrating tablet Take 1 tablet (4 mg total) by mouth every 8 (eight) hours as needed for nausea or vomiting. 6 tablet 0   pantoprazole (PROTONIX) 40 MG tablet Take 40 mg by mouth 2 (two) times daily.      predniSONE (DELTASONE) 20 MG tablet Take 2 tablets (40 mg total) by mouth daily with breakfast. 180 tablet 0   riTUXimab E1908 (RITUXAN) 10 MG/ML injection Inject into the vein every Tuesday.      spironolactone (ALDACTONE) 25 MG tablet Take 25 mg by mouth daily.     tadalafil, PAH, (ADCIRCA) 20 MG tablet Take 2 tablets (40 mg total) by mouth daily. 60 tablet 6   traMADol (ULTRAM) 50 MG tablet Take 1 tablet (50 mg total) by mouth every 6 (six) hours as needed. 15 tablet 0    vitamin C (ASCORBIC ACID) 500 MG tablet Take 500 mg by mouth daily with lunch.     warfarin (COUMADIN) 5 MG tablet Take 5-7.5 mg by mouth daily. Take 1.5 tablets (7.5 mg) on Tues, Thursday, Saturday and Sunday. Take 1 tablet (5mg ) on Mondays, Wednesdays, and Fridays.     No current facility-administered medications for this visit.      OBJECTIVE:   Vitals:   07/08/19 0905  BP: (!) 183/86  Pulse: (!) 55  Resp: 18  Temp: 98.3 F (36.8 C)  SpO2: 99%   Wt Readings from Last 3 Encounters:  07/08/19 125 lb 9.6 oz (57 kg)  07/01/19 125 lb (56.7 kg)  06/20/19 126 lb 14.4 oz (57.6 kg)   Body mass index is 22.97 kg/m.    ECOG FS:2 - Symptomatic, <50% confined to bed GENERAL: Older woman examined in wheelchair. HEENT:  Sclerae anicteric.  Mask in place. Neck is supple.  NODES:  No cervical, supraclavicular, or axillary lymphadenopathy palpated.  LUNGS:  Clear to auscultation bilaterally.  No wheezes or rhonchi. HEART:  Regular rate and rhythm. No murmur appreciated. ABDOMEN:  Soft, nontender.  Positive, normoactive bowel sounds. MSK:  No focal spinal tenderness to palpation EXTREMITIES:  Bilateral lower extremity edema 2+ SKIN:  Clear with no obvious rashes or skin changes. No nail dyscrasia. NEURO:  Nonfocal. Well oriented.  Appropriate affect.     LAB RESULTS:  CMP     Component Value Date/Time   NA 136 07/07/2019 0208   NA 137 12/06/2017 1436   K 3.5 07/07/2019 0208   CL 101 07/07/2019 0208   CO2 22 07/07/2019 0208   GLUCOSE 101 (H) 07/07/2019 0208   BUN 56 (H) 07/07/2019 0208   BUN 20 12/06/2017 1436   CREATININE 1.35 (H) 07/07/2019 0208   CREATININE 1.40 (H) 07/01/2019 1133   CALCIUM 9.1 07/07/2019 0208   PROT 5.5 (L) 07/01/2019 1133   ALBUMIN 3.1 (L) 07/01/2019 1133  AST 25 07/01/2019 1133   ALT 19 07/01/2019 1133   ALKPHOS 65 07/01/2019 1133   BILITOT 0.6 07/01/2019 1133   GFRNONAA 37 (L) 07/07/2019 0208   GFRNONAA 35 (L) 07/01/2019 1133   GFRAA 43 (L)  07/07/2019 0208   GFRAA 41 (L) 07/01/2019 1133    Lab Results  Component Value Date   TOTALPROTELP 6.9 05/07/2019    No results found for: Nils Pyle, [facility]  Lab Results  Component Value Date   WBC 4.2 07/08/2019   NEUTROABS 3.1 07/08/2019   HGB 9.2 (L) 07/08/2019   HCT 27.7 (L) 07/08/2019   MCV 91.1 07/08/2019   PLT 139 (L) 07/08/2019    @LASTCHEMISTRY @  No results found for: LABCA2  No components found for: [id number]  Recent Labs  Lab 07/07/19 0208  INR 2.2*    No results found for: LABCA2  No results found for: VQQ595  No results found for: GLO756  No results found for: EPP295  No results found for: CA2729  No components found for: HGQUANT  No results found for: CEA1 / No results found for: CEA1   No results found for: AFPTUMOR  No results found for: CHROMOGRNA  No results found for: PSA1  Appointment on 07/08/2019  Component Date Value Ref Range Status   Smear Review 07/08/2019 SMEAR STAINED AND AVAILABLE FOR REVIEW   Final   Performed at [facility], [place] [address] [place], [place] [address]   Retic Ct Pct 07/08/2019 2.1  0.4 - 3.1 % Final   RBC. 07/08/2019 3.14* 3.87 - 5.11 MIL/uL Final   Retic Count, Absolute 07/08/2019 65.9  19.0 - 186.0 K/uL Final   Immature Retic Fract 07/08/2019 10.4  2.3 - 15.9 % Final   Performed at [facility], [place] [address], [place], [place] [address]   WBC 07/08/2019 4.2  4.0 - 10.5 K/uL Final   RBC 07/08/2019 3.04* 3.87 - 5.11 MIL/uL Final   Hemoglobin 07/08/2019 9.2* 12.0 - 15.0 g/dL Final   HCT 07/08/2019 27.7* 36.0 - 46.0 % Final   MCV 07/08/2019 91.1  80.0 - 100.0 fL Final   MCH 07/08/2019 30.3  26.0 - 34.0 pg Final   MCHC 07/08/2019 33.2  30.0 - 36.0 g/dL Final   RDW 07/08/2019 16.9* 11.5 - 15.5 % Final   Platelets 07/08/2019 139* 150 - 400 K/uL Final   nRBC 07/08/2019 0.0  0.0 - 0.2 % Final   Neutrophils Relative %  07/08/2019 72  % Final   Neutro Abs 07/08/2019 3.1  1.7 - 7.7 K/uL Final   Lymphocytes Relative 07/08/2019 13  % Final   Lymphs Abs 07/08/2019 0.6* 0.7 - 4.0 K/uL Final   Monocytes Relative 07/08/2019 8  % Final   Monocytes Absolute 07/08/2019 0.4  0.1 - 1.0 K/uL Final   Eosinophils Relative 07/08/2019 1  % Final   Eosinophils Absolute 07/08/2019 0.0  0.0 - 0.5 K/uL Final   Basophils Relative 07/08/2019 1  % Final   Basophils Absolute 07/08/2019 0.0  0.0 - 0.1 K/uL Final   Immature Granulocytes 07/08/2019 5  % Final   Abs Immature Granulocytes 07/08/2019 0.20* 0.00 - 0.07 K/uL Final   Performed at [facility], [place] [address] [place], [place] [address]  Admission on 07/07/2019, Discharged on 07/07/2019  Component Date Value Ref Range Status   WBC 07/07/2019 3.6* 4.0 - 10.5 K/uL Final   RBC 07/07/2019 3.39* 3.87 - 5.11 MIL/uL Final  Hemoglobin 07/07/2019 10.2* 12.0 - 15.0 g/dL Final   HCT 07/07/2019 31.5* 36.0 - 46.0 % Final   MCV 07/07/2019 92.9  80.0 - 100.0 fL Final   MCH 07/07/2019 30.1  26.0 - 34.0 pg Final   MCHC 07/07/2019 32.4  30.0 - 36.0 g/dL Final   RDW 07/07/2019 16.9* 11.5 - 15.5 % Final   Platelets 07/07/2019 153  150 - 400 K/uL Final   nRBC 07/07/2019 0.0  0.0 - 0.2 % Final   Neutrophils Relative % 07/07/2019 73  % Final   Neutro Abs 07/07/2019 2.7  1.7 - 7.7 K/uL Final   Lymphocytes Relative 07/07/2019 15  % Final   Lymphs Abs 07/07/2019 0.5* 0.7 - 4.0 K/uL Final   Monocytes Relative 07/07/2019 8  % Final   Monocytes Absolute 07/07/2019 0.3  0.1 - 1.0 K/uL Final   Eosinophils Relative 07/07/2019 1  % Final   Eosinophils Absolute 07/07/2019 0.0  0.0 - 0.5 K/uL Final   Basophils Relative 07/07/2019 0  % Final   Basophils Absolute 07/07/2019 0.0  0.0 - 0.1 K/uL Final   Immature Granulocytes 07/07/2019 3  % Final   Abs Immature Granulocytes 07/07/2019 0.10* 0.00 - 0.07 K/uL Final   Performed at [facility], [place] [address], [place], [place] [address]   Sodium 07/07/2019 136  135 - 145 mmol/L Final   Potassium 07/07/2019 3.5  3.5 - 5.1 mmol/L Final   Chloride 07/07/2019 101  98 - 111 mmol/L Final   CO2 07/07/2019 22  22 - 32 mmol/L Final   Glucose, Bld 07/07/2019 101* 70 - 99 mg/dL Final   BUN 07/07/2019 56* 8 - 23 mg/dL Final   Creatinine, Ser 07/07/2019 1.35* 0.44 - 1.00 mg/dL Final   Calcium 07/07/2019 9.1  8.9 - 10.3 mg/dL Final   GFR calc non Af Amer 07/07/2019 37* >60 mL/min Final   GFR calc Af Amer 07/07/2019 43* >60 mL/min Final   Anion gap 07/07/2019 13  5 - 15 Final   Performed at [facility], [place] [address] [place],  [address]   Prothrombin Time 07/07/2019 24.4* 11.4 - 15.2 seconds Final   INR 07/07/2019 2.2* 0.8 - 1.2 Final   Comment: (NOTE) INR goal varies based on device and disease states. Performed at [facility], [place] [address], [place], [place] [address]    Color, Urine 07/07/2019 YELLOW  YELLOW Final   APPearance 07/07/2019 CLOUDY* CLEAR Final   Specific Gravity, Urine 07/07/2019 1.011  1.005 - 1.030 Final   pH 07/07/2019 6.0  5.0 - 8.0 Final   Glucose, UA 07/07/2019 NEGATIVE  NEGATIVE mg/dL Final   Hgb urine dipstick 07/07/2019 NEGATIVE  NEGATIVE Final   Bilirubin Urine 07/07/2019 NEGATIVE  NEGATIVE Final   Ketones, ur 07/07/2019 NEGATIVE  NEGATIVE mg/dL Final   Protein, ur 07/07/2019 >=300* NEGATIVE mg/dL Final   Nitrite 07/07/2019 NEGATIVE  NEGATIVE Final   Leukocytes,Ua 07/07/2019 LARGE* NEGATIVE Final   RBC / HPF 07/07/2019 0-5  0 - 5 RBC/hpf Final   WBC, UA 07/07/2019 >50* 0 - 5 WBC/hpf Final   Bacteria, UA 07/07/2019 MANY* NONE SEEN Final   Squamous Epithelial / LPF 07/07/2019 0-5  0 - 5 Final   WBC Clumps 07/07/2019 PRESENT   Final   Non Squamous Epithelial 07/07/2019 0-5* NONE SEEN Final   Performed at [facility], [place] [address],  [place], [place] [address]   B Natriuretic Peptide 07/07/2019 311.8* 0.0 - 100.0  pg/mL Final   Performed at [facility], [place] [address], [name],  [address]    (this displays the last labs from the last 3 days)  Lab Results  Component Value Date   TOTALPROTELP 6.9 05/07/2019   (this displays SPEP labs)  No results found for: KPAFRELGTCHN, LAMBDASER, KAPLAMBRATIO (kappa/lambda light chains)  No results found for: HGBA, HGBA2QUANT, HGBFQUANT, HGBSQUAN (Hemoglobinopathy evaluation)   Lab Results  Component Value Date   LDH 197 (H) 02/27/2019    Lab Results  Component Value Date   IRON 49 07/01/2019   TIBC 233 (L) 07/01/2019   IRONPCTSAT 21 07/01/2019   (Iron and TIBC)  Lab Results  Component Value Date   FERRITIN 158 07/01/2019    Urinalysis    Component Value Date/Time   COLORURINE YELLOW 07/07/2019 0209   APPEARANCEUR CLOUDY (A) 07/07/2019 0209   LABSPEC 1.011 07/07/2019 0209   PHURINE 6.0 07/07/2019 0209   GLUCOSEU NEGATIVE 07/07/2019 0209   HGBUR NEGATIVE 07/07/2019 0209   BILIRUBINUR NEGATIVE 07/07/2019 [place] 07/07/2019 0209   PROTEINUR >=300 (A) 07/07/2019 0209   UROBILINOGEN 1.0 10/06/2011 1709   NITRITE NEGATIVE 07/07/2019 0209   LEUKOCYTESUR LARGE (A) 07/07/2019 0209     STUDIES:  Dg Chest 2 View  Result Date: 07/07/2019 CLINICAL DATA:  Edema possible compression deformity EXAM: CHEST - 2 VIEW COMPARISON:  Radiograph May 28, 2019, CT December 12, 2018 FINDINGS: Postsurgical changes related to prior CABG including intact and aligned sternotomy wires and multiple surgical clips projecting over the mediastinum. Cephalized indistinct pulmonary vascularity with interlobular septal and fissural thickening and hazy interstitial opacities in lung bases. Stable calcified granulomata are present in the lungs. There is blunting of the costophrenic sulcus which could reflect either flattening of the diaphragms or trace  effusion. Multilevel degenerative changes are present in the spine. No distinct compression deformity is identified however the bones are diffusely osteopenic and superimposition of scapula a may limit detection of the upper thoracic levels. IMPRESSION: 1. Postsurgical changes related to prior CABG. 2. Findings consistent with mild to moderate pulmonary edema. 3. No distinct compression deformity is identified however osteopenia and superimposed scapula may limit detection of subtle fractures. Electronically Signed   By: Lovena Le M.D.   On: 07/07/2019 02:30   Ct L-spine No Charge  Result Date: 07/07/2019 CLINICAL DATA:  Compression fracture EXAM: CT LUMBAR SPINE WITHOUT CONTRAST TECHNIQUE: Multidetector CT imaging of the lumbar spine was performed without intravenous contrast administration. Multiplanar CT image reconstructions were also generated. COMPARISON:  Abdominal CT 01/18/2018 FINDINGS: Segmentation: 5 lumbar type vertebrae Alignment: Lower lumbar dextroscoliosis and upper lumbar levoscoliosis. No traumatic malalignment. Vertebrae: L4 superior endplate fracture that has occurred since comparison but is chronic appearing. There may have also been a left L5 transverse process fracture in the interim. No acute fracture. No evident bone lesion or erosion Paraspinal and other soft tissues: Reported separately Disc levels: T12- L1: Left-sided disc narrowing and facet spurring with moderate left foraminal stenosis. L1-L2: Mild disc narrowing and bulging.  Mild facet spurring. L2-L3: Disc narrowing and bulging with facet spurring causing mild bilateral foraminal stenosis. L3-L4: Disc narrowing and bulging. Degenerative facet spurring that is moderate. Moderate right more than left foraminal narrowing. Moderate or advanced spinal stenosis. L4-L5: Asymmetric left-sided degenerative disc collapse and ridging. Asymmetric left facet spurring. Left foraminal impingement. Mild spinal stenosis. L5-S1:Greatest level  of degenerative disc narrowing with spurring. Mild facet degeneration. The canal and foramina appear patent. IMPRESSION:  1. No acute finding. 2. L4 superior endplate fracture that has occurred since 2019 but is chronic appearing. 3. Degenerative disease with scoliosis as described. Electronically Signed   By: Monte Fantasia M.D.   On: 07/07/2019 04:55   Ct Renal Stone Study  Result Date: 07/07/2019 CLINICAL DATA:  Flank pain with stone disease suspected EXAM: CT ABDOMEN AND PELVIS WITHOUT CONTRAST TECHNIQUE: Multidetector CT imaging of the abdomen and pelvis was performed following the standard protocol without IV contrast. COMPARISON:  02/22/2018 FINDINGS: Lower chest: Mild atelectasis at the lung bases. Trace bilateral pleural fluid. Calcified granuloma in the lingula. Hepatobiliary: No focal liver abnormality.Cholecystectomy. No common bile duct dilatation. Pancreas: Unremarkable. Spleen: Calcifications that are likely both arterial and granulomatous. Adrenals/Urinary Tract: Negative adrenals. No hydronephrosis or stone. Moderate gas within the bladder lumen, please correlate with urinalysis. Stomach/Bowel: No obstruction. No evidence of inflammation. Scattered stool without colonic over distension or rectal impaction. Distal colonic diverticulosis. Vascular/Lymphatic: No acute vascular abnormality. Widespread atherosclerotic calcification with narrow appearance of the bilateral iliacs. A left iliac stent is present. No mass or adenopathy. Reproductive:Hysterectomy with possible oophorectomies. Other: No significant ascites or pneumoperitoneum. Musculoskeletal: No acute abnormalities. Spinal degeneration with scoliosis as described on dedicated study. Advanced right hip osteoarthritis. Osteitis pubis. Generalized osteopenia. IMPRESSION: 1. No hydronephrosis or urolithiasis. 2. Moderate gas in the urinary bladder, please correlate with urinalysis. 3. Extensive atherosclerosis. Electronically Signed   By:  Monte Fantasia M.D.   On: 07/07/2019 05:24     ELIGIBLE FOR AVAILABLE RESEARCH PROTOCOL: no   ASSESSMENT: 78 y.o. [place] woman with multifactorial anemia, as follows:             (a) anemia of chronic inflammation: As of May 2020 she has a SED rate of 95, CRP 1.1, positive ANA and RF; subsequently she was found to be ANCA positive             (b) hemolysis (mild): she has a mildly elevated LDH and a DAT positive for complement, not IgG; this is c/w (a) above             (c) anemia of renal insufficiency: inadequate EPO resonse to anemia              (d) GIB/ iron deficiency in patient on lifelong anticoagulaton  (1) Feraheme received 03/01/2019, repeated 03/20/2019   (a) reticulocyte 03/07/2019 up from 43.4 a year ago to 191.8 after iron infusion  (b) feraheme repeated on 05/19/2019  (2) darbepoetin: starting 05/05/2019 at 173mcg given every 2 weeks  (a) dose increased to 200 mcg every 2 weeks starting 07/01/2019.    (3) ANCA positive vasculitis: diagnosed 05/08/2019 (titer 1:640)  (a) prednisone 40 mg daily started 05/31/2019, on taper  (b) rituximab weekly started 06/12/2019 (received test dose 06/05/2019   (i) hepatitis B sAg, core Ab and HIV negative 05/08/2019  (4) osteopenia with a T score of -1.5 on bone density 02/28/2016 at the breast center    PLAN: [name] is doing well today.  Her hemoglobin remains stable.  She will continue with weekly Rituximab.  I noted her slightly elevated blood pressure and untreated UTI with Dr. Jana Hakim.  He is ok with her proceeding with treatment.  I sent Dr. Aundra Dubin a message about Kam[place]i's blood pressure and pulmonary edema.  I recommended that she pick up the medication she received that was sent in from the ER, Tramadol, and Keflex.  She says she will do this today.    [name] will continue  on her current dose of Prednisone.  I also added nursing communication to her chemo orders regarding the aranesp inejction which should be given  every 2 weeks.    We will see [name] back in with every other Rituximab.  She was recommended to continue with the appropriate pandemic precautions. [name] knows to call us for any other issue that may develop before the next visit here.  A total of (30) minutes of face-to-face time was spent with this patient with greater than 50% of that time in counseling and care-coordination.   [name] Bihari, NP  07/08/19 9:14 AM Medical Oncology and Hematology [facility] [place], [place] [address] Tel. [phone]    Fax. [phone]

## 2019-07-08 NOTE — Patient Instructions (Signed)
[facility] Discharge Instructions for Patients Receiving Chemotherapy  Today you received the following chemotherapy agents:  Rituximab  To help prevent nausea and vomiting after your treatment, we encourage you to take your nausea medication as prescribed.   If you develop nausea and vomiting that is not controlled by your nausea medication, call the clinic.   BELOW ARE SYMPTOMS THAT SHOULD BE REPORTED IMMEDIATELY:  *FEVER GREATER THAN 100.5 F  *CHILLS WITH OR WITHOUT FEVER  NAUSEA AND VOMITING THAT IS NOT CONTROLLED WITH YOUR NAUSEA MEDICATION  *UNUSUAL SHORTNESS OF BREATH  *UNUSUAL BRUISING OR BLEEDING  TENDERNESS IN MOUTH AND THROAT WITH OR WITHOUT PRESENCE OF ULCERS  *URINARY PROBLEMS  *BOWEL PROBLEMS  UNUSUAL RASH Items with * indicate a potential emergency and should be followed up as soon as possible.  Feel free to call the clinic should you have any questions or concerns. The clinic phone number is [phone].  Please show the [place] at check-in to the Emergency Department and triage nurse.   Darbepoetin Alfa injection What is this medicine? DARBEPOETIN ALFA (dar be POE e tin AL fa) helps your body make more red blood cells. It is used to treat anemia caused by chronic kidney failure and chemotherapy. This medicine may be used for other purposes; ask your health care provider or pharmacist if you have questions. COMMON BRAND NAME(S): Aranesp What should I tell my health care provider before I take this medicine? They need to know if you have any of these conditions:  blood clotting disorders or history of blood clots  cancer patient not on chemotherapy  cystic fibrosis  heart disease, such as angina, heart failure, or a history of a heart attack  hemoglobin level of 12 g/dL or greater  high blood pressure  low levels of folate, iron, or vitamin B12  seizures  an unusual or allergic reaction to darbepoetin,  erythropoietin, albumin, hamster proteins, latex, other medicines, foods, dyes, or preservatives  pregnant or trying to get pregnant  breast-feeding How should I use this medicine? This medicine is for injection into a vein or under the skin. It is usually given by a health care professional in a hospital or clinic setting. If you get this medicine at home, you will be taught how to prepare and give this medicine. Use exactly as directed. Take your medicine at regular intervals. Do not take your medicine more often than directed. It is important that you put your used needles and syringes in a special sharps container. Do not put them in a trash can. If you do not have a sharps container, call your pharmacist or healthcare provider to get one. A special MedGuide will be given to you by the pharmacist with each prescription and refill. Be sure to read this information carefully each time. Talk to your pediatrician regarding the use of this medicine in children. While this medicine may be used in children as young as 9 month of age for selected conditions, precautions do apply. Overdosage: If you think you have taken too much of this medicine contact a poison control center or emergency room at once. NOTE: This medicine is only for you. Do not share this medicine with others. What if I miss a dose? If you miss a dose, take it as soon as you can. If it is almost time for your next dose, take only that dose. Do not take double or extra doses. What may interact with this medicine? Do not take  this medicine with any of the following medications:  epoetin alfa This list may not describe all possible interactions. Give your health care provider a list of all the medicines, herbs, non-prescription drugs, or dietary supplements you use. Also tell them if you smoke, drink alcohol, or use illegal drugs. Some items may interact with your medicine. What should I watch for while using this medicine? Your  condition will be monitored carefully while you are receiving this medicine. You may need blood work done while you are taking this medicine. This medicine may cause a decrease in vitamin B6. You should make sure that you get enough vitamin B6 while you are taking this medicine. Discuss the foods you eat and the vitamins you take with your health care professional. What side effects may I notice from receiving this medicine? Side effects that you should report to your doctor or health care professional as soon as possible:  allergic reactions like skin rash, itching or hives, swelling of the face, lips, or tongue  breathing problems  changes in vision  chest pain  confusion, trouble speaking or understanding  feeling faint or lightheaded, falls  high blood pressure  muscle aches or pains  pain, swelling, warmth in the leg  rapid weight gain  severe headaches  sudden numbness or weakness of the face, arm or leg  trouble walking, dizziness, loss of balance or coordination  seizures (convulsions)  swelling of the ankles, feet, hands  unusually weak or tired Side effects that usually do not require medical attention (report to your doctor or health care professional if they continue or are bothersome):  diarrhea  fever, chills (flu-like symptoms)  headaches  nausea, vomiting  redness, stinging, or swelling at site where injected This list may not describe all possible side effects. Call your doctor for medical advice about side effects. You may report side effects to FDA at 1-800-FDA-1088. Where should I keep my medicine? Keep out of the reach of children. Store in a refrigerator between 2 and 8 degrees C (36 and 46 degrees F). Do not freeze. Do not shake. Throw away any unused portion if using a single-dose vial. Throw away any unused medicine after the expiration date. NOTE: This sheet is a summary. It may not cover all possible information. If you have questions  about this medicine, talk to your doctor, pharmacist, or health care provider.  2020 Elsevier/Gold Standard (2017-10-10 16:44:20)

## 2019-07-08 NOTE — Progress Notes (Signed)
Okay to treat today with Crea. 1.5, per Wilber Bihari, NP.

## 2019-07-10 NOTE — Progress Notes (Signed)
Date:  07/11/2019   ID:  [name], DOB 21-Mar-1941, MRN [id number]    Provider location: [facility] Type of Visit: Established patient   PCP:  [name], MD  Cardiologist:  Quay Burow, MD Primary HF: Dr. Aundra Dubin  Chief Complaint: Shortness of breath   History of Present Illness: [name] is a 78 y.o. female who has a history of PAD, CAD s/p CABG, mechanical AVR, pulmonary hypertension, and chronic diastolic CHF with prominent RV dysfunction. Patient had CABG-AVR in 10/05.  She has been in chronic atrial fibrillation, it appears, since at least 4/17.  She has significant PAD followed by Dr. [name].    She was admitted in 5/19 with dyspnea and marked volume overloaded. She was started on Lasix and it was titrated up.  Echo showed EF 55-60% with moderate RV dilation, severe RV systolic dysfunction, and PASP 88 mmHg.  [place] in 9/19 showed moderate pulmonary hypertension with near normal filling pressures. PVR was not particularly high at 3.1 WU primarily due to elevated cardiac output. V/Q scan did not show chronic PEs and PFTs were unremarkable. She is on Adcirca.   She was admitted in 2/20 with upper GI bleeding, found to have gastric ulcers.  She had a CT chest in 3/20 with subtle bilateral ground glass opacities.  Referred to pulmonary.   She was admitted again in 6/20 with a GI bleed and required transfusion.   She was seen by pulmonary in 7/20, PFTs showed no obstruction/restriction but decreased DLCO.    She was seen by rheumatology due to concern for rheumatoid arthritis.  She was thought to have gout and OA but no RA.   Echo 05/07/19. EF 60-65% with mild LVH, normal RV, mechanical aortic valve with mean gradient 17 mmHg, PASP 34 mmHg.   Admitted 8/17 with acute hypoxic respiratory failure. Diuresed with IV lasix. Hem/Onc consulted due to pancytopenia with recommendations for BM bx but needs Rheumatology follow for possible vasculitis. +  ANCA. Placed on ritux and prednisone. Of note irbesartan stopped due to elevated creatinine.   Today she returns for HF follow up due to increased leg edema. Overall feeling fair. Having left hip pain. Over the last 3 weeks she had noticed increased leg edema. Mild SOB with exertion. Uses rolling walker in her house.  Denies PND/Orthopnea. No dizziness/syncope.  Appetite ok. No fever or chills. Weight at home 124-125 pounds. Taking all medications. Lives alone. Requires assistance with transportation.   ECG (personally reviewed): NSR, 1st degree AVB, right axis deviation  Labs (8/19): K 4.2, creatinine 0.8, LDL 56 Labs (9/19): K 4.3, Na 129, creatinine 0.87, hgb 9.8 Labs (10/19): ANA+, RF 36.8 (elevated), SCL-70 negative, anti-centromere Ab negative, ds-DNA negative.  Labs (12/19): K 4, creatinine 1.09 Labs (1/20): CCP Ab elevated 115 Labs (2/20): K 4.1, creatinine 1.24 Labs (7/20): K 4.2, creatinine 1.99, hgb 6.5 (transfused)  6 min walk (10/19): 189 m 6 min walk (1/20): 53 m  PMH: 1. Mechanical St Jude aortic valve 10/05: Goal INR 2.5-3.5.  2. CAD: s/p CABG x 2 with AVR in 10/05.  - Cardiolite in 2014 showed no ischemia.  3. HTN 4. Hyperlipidemia 5. Type 2 diabetes 6. PAD: Peripheral arterial dopplers (7/19) with >50% CIA stenosis on right, 70-99% CIA stenosis on the left.  7. H/o CCY 8. Atrial fibrillation: Chronic since at least 4/17. 9. Chronic diastolic CHF with prominent RV dysfunction: Echo (5/19) with EF 86-76%, grade 3 diastolic  dysfunction, mechanical aortic valve with mean gradient 10 mmHg, mild MR, moderate RV dilation with moderate-severely decreased RV systolic function, biatrial enlargement, PASP 88 mmHg.  - Echo (7/20): EF 60-65% with mild LVH, normal RV, mechanical aortic valve with mean gradient 17 mmHg, PASP 34 mmHg.  10. Carotid stenosis: 40-59% BICA stenosis on 7/19 carotid dopplers.  11. Pulmonary hypertension: RHC (9/19) with mean RA 5, PA 63/17 mean 36, mean  PCWP 16, CI 4.16, PVR 3.1 WU.  - V/Q scan (10/19): no evidence for chronic PE.  - PFTs (10/19): minimal obstruction.  - ANA+, RF 36.8 (elevated), SCL-70 negative, anti-centromere Ab negative, ds-DNA negative, CCP Ab positive. She saw rheumatology, NOT thought to have RA.  - Echo in 7/20 with normal-appearing RV and PASP 34 mmHg.  - PFTs (7/20) with no significant obstruction/restriction but decreased DLCO.  12. 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 History of GI bleeding.   Current Outpatient Medications  Medication Sig Dispense Refill  . acetaminophen (TYLENOL) 325 MG tablet Take 650 mg by mouth every 6 (six) hours as needed for mild pain.     [name] allopurinol (ZYLOPRIM) 100 MG tablet Take 100 mg by mouth daily.    [name] ALPRAZolam (XANAX) 0.5 MG tablet Take 0.25 mg by mouth at bedtime.     [name] atorvastatin (LIPITOR) 80 MG tablet Take 1 tablet (80 mg total) by mouth daily at 6 PM.    . cephALEXin (KEFLEX) 500 MG capsule Take 1 capsule (500 mg total) by mouth 3 (three) times daily. 15 capsule 0  . Cholecalciferol (VITAMIN D3) 1000 UNITS CAPS Take 2,000 Units by mouth daily with lunch.     . cloNIDine (CATAPRES) 0.1 MG tablet Take 1 tablet (0.1 mg total) by mouth 2 (two) times daily. 60 tablet 1  . Cyanocobalamin (VITAMIN B12) 1000 MCG TBCR Take 1,000 mcg by mouth daily with lunch.     . feeding supplement, GLUCERNA SHAKE, (GLUCERNA SHAKE) LIQD Take 237 mLs by mouth 2 (two) times daily between meals. 237 mL 30  . furosemide (LASIX) 40 MG tablet Take 1.5 tablets (60 mg total) by mouth daily. 135 tablet 6  . hydrALAZINE (APRESOLINE) 100 MG tablet Take 1 tablet (100 mg total) by mouth 3 (three) times daily. 270 tablet 1  . Magnesium 500 MG TABS Take 500 mg by mouth daily.    . magnesium oxide (MAG-OX) 400 (241.3 Mg) MG tablet Take 1 tablet (400 mg total) by mouth daily.    . metFORMIN (GLUCOPHAGE) 500 MG tablet Take 500 mg by mouth 2 (two) times daily with a meal.     . metoprolol tartrate (LOPRESSOR) 25 MG  tablet Take 0.5 tablets (12.5 mg total) by mouth 2 (two) times daily. 60 tablet 0  . ondansetron (ZOFRAN ODT) 4 MG disintegrating tablet Take 1 tablet (4 mg total) by mouth every 8 (eight) hours as needed for nausea or vomiting. 6 tablet 0  . pantoprazole (PROTONIX) 40 MG tablet Take 40 mg by mouth 2 (two) times daily.     . riTUXimab V2536 (RITUXAN) 10 MG/ML injection Inject into the vein every Tuesday.     [name] spironolactone (ALDACTONE) 25 MG tablet Take 25 mg by mouth daily.    . tadalafil, PAH, (ADCIRCA) 20 MG tablet Take 2 tablets (40 mg total) by mouth daily. 60 tablet 6  . traMADol (ULTRAM) 50 MG tablet Take 1 tablet (50 mg total) by mouth every 6 (six) hours as needed. 15 tablet 0  . vitamin C (ASCORBIC ACID)  500 MG tablet Take 500 mg by mouth daily with lunch.    . warfarin (COUMADIN) 5 MG tablet Take 5-7.5 mg by mouth daily. Take 1.5 tablets (7.5 mg) on Tues, Thursday, Saturday and Sunday. Take 1 tablet (5mg ) on Mondays, Wednesdays, and Fridays.     No current facility-administered medications for this encounter.     Allergies:   Flagyl [metronidazole], Coreg [carvedilol], Losartan, Verapamil, Zetia [ezetimibe], and Zocor [simvastatin - high dose]   Social History:  The patient  reports that she has quit smoking. Her smoking use included cigarettes. She has a 30.00 pack-year smoking history. She has never used smokeless tobacco. She reports that she does not drink alcohol or use drugs.   Family History:  [name] family history is not on file.   ROS:  Please see the history of present illness.   All other systems are personally reviewed and negative.  ReDS Vest - 07/11/19 0900      ReDS Vest   Fitting Posture  Sitting    Height Marker  --   Station B   Ruler Value  26.5    ReDS Value  39       Exam:   BP (!) 168/62   Pulse 61   Wt 56.9 kg (125 lb 6 oz)   SpO2 97%   BMI 22.93 kg/m  General:  Pale. Appears chronically ill. Arrived in wheel chair.  HEENT: normal  Neck: supple. no JVD. Carotids 2+ bilat; no bruits. No lymphadenopathy or thryomegaly appreciated. Cor: PMI nondisplaced. Regular rate & rhythm. No rubs, gallops or murmurs. Lungs: clear Abdomen: soft, nontender, nondistended. No hepatosplenomegaly. No bruits or masses. Good bowel sounds. Extremities: no cyanosis, clubbing, rash, R and LLE 2+ edema Neuro: alert & orientedx3, cranial nerves grossly intact. moves all 4 extremities w/o difficulty. Affect pleasant    Recent Labs: 05/30/2019: Magnesium 2.1 07/07/2019: B Natriuretic Peptide 311.8 07/08/2019: ALT 16; BUN 48; Creatinine 1.50; Hemoglobin 9.2; Platelets 139; Potassium 3.7; Sodium 136  Personally reviewed   Wt Readings from Last 3 Encounters:  07/11/19 56.9 kg (125 lb 6 oz)  07/08/19 57 kg (125 lb 9.6 oz)  07/01/19 56.7 kg (125 lb)     ASSESSMENT AND PLAN:  1. Atrial fibrillation: Chronic.  She has been on warfarin with mechanical aortic valve.  She has been in atrial fibrillation as far back as 4/17.  Rate controlled.  Can continue low dose metoprolol, HR controlled.  Would hold off on anti-arrhythmic meds.   - Continue warfarin, goal INR 2.5-3.5. No ASA with history of GI bleeding.  - With age, diastolic CHF, LVH and renal dysfunction, think it is reasonable to assess for cardiac amyloidosis.  - PYP - Low suspicion for amyloid.   - IFE negative.  2. Chronic diastolic CHF: With prominent RV dysfunction and severe pulmonary hypertension by echo in 2019. Filling pressures were well-compensated on 9/19 RHC. Repeat echo in 7/20 showed normal RV size and systolic function and PA systolic pressure down to 34 mmHg.  - Reds Clip 39%.   - NYHA III. Volume status elevated. Increase lasix to 80 mg twice a day x 2 days then start lasix 80 mg daily.  - Check BMET, Reds CLip next week.  3. CAD: No chest pain.  - Continue atorvastatin and warfarin.  - Will need to check lipids.  4. PAD: Significant on 7/19 but no claudication, no pedal  ulcerations. She will need repeat peripheral arterial dopplers ordered.  5. Mechanical aortic valve:  Stable on 5/19 echo.  Given co-existing atrial fibrillation, goal INR is 2.5-3.5.  - Continue warfarin.  - No ASA with GI bleeding.  6. Pulmonary hypertension: Moderate pulmonary hypertension on 9/19 with RV dysfunction and dilation noted on last echo. PVR only 3.1 WU but cardiac output was high.  V/Q scan in 10/19 with no evidence for chronic PEs and PFTs in 10/19 and again in 7/20 were unremarkable except for low DLCO.  I think there is a possible group 1 PH component though this is a borderline call as PVR is not significantly elevated. ANA and RF positive, as is CCP Ab.  She saw rheumatology and per her report, was not thought to have rheumatoid arthritis.  CT chest showed subtle bilateral ground glass opacities in 3/20, she is followed by pulmonary.   - Continue Adcirca 40 mg daily.   Will hold off on starting a 2nd [place] medication for now.  Repeat echo in 7/20 showed normal RV and estimated PA systolic pressure 34 mmHg.  7. Rheumatology: Joint pain, positive RF and CCP Ab.  Lung findings (ground glass, pulmonary hypertension) also thought to be concerning for RA. However, per patient's report, she was told she did not have RA and did have gout. 8. HTN Recheck next week. May need to restart arb if renal function stable.    Follow up in 8 weeks.   Jeanmarie Hubert, NP  07/11/2019   [facility] [address] [facility] and [place] [address] [phone] (office) [phone] (fax)

## 2019-07-11 ENCOUNTER — Other Ambulatory Visit: Payer: Self-pay

## 2019-07-11 ENCOUNTER — Ambulatory Visit (HOSPITAL_COMMUNITY)
Admission: RE | Admit: 2019-07-11 | Discharge: 2019-07-11 | Disposition: A | Discharge status: Home / Self Care | Payer: Medicare HMO | Source: Ambulatory Visit | Attending: Cardiology | Admitting: Cardiology

## 2019-07-11 ENCOUNTER — Other Ambulatory Visit: Payer: Self-pay | Admitting: *Deleted

## 2019-07-11 VITALS — BP 168/62 | HR 61 | Wt 125.4 lb

## 2019-07-11 DIAGNOSIS — F17210 Nicotine dependence, cigarettes, uncomplicated: Secondary | ICD-10-CM | POA: Diagnosis not present

## 2019-07-11 DIAGNOSIS — Z7901 Long term (current) use of anticoagulants: Secondary | ICD-10-CM | POA: Diagnosis not present

## 2019-07-11 DIAGNOSIS — Z952 Presence of prosthetic heart valve: Secondary | ICD-10-CM | POA: Insufficient documentation

## 2019-07-11 DIAGNOSIS — M109 Gout, unspecified: Secondary | ICD-10-CM | POA: Insufficient documentation

## 2019-07-11 DIAGNOSIS — Z8711 Personal history of peptic ulcer disease: Secondary | ICD-10-CM | POA: Diagnosis not present

## 2019-07-11 DIAGNOSIS — E785 Hyperlipidemia, unspecified: Secondary | ICD-10-CM | POA: Diagnosis not present

## 2019-07-11 DIAGNOSIS — R600 Localized edema: Secondary | ICD-10-CM

## 2019-07-11 DIAGNOSIS — I110 Hypertensive heart disease with heart failure: Secondary | ICD-10-CM | POA: Insufficient documentation

## 2019-07-11 DIAGNOSIS — E119 Type 2 diabetes mellitus without complications: Secondary | ICD-10-CM | POA: Diagnosis not present

## 2019-07-11 DIAGNOSIS — I10 Essential (primary) hypertension: Secondary | ICD-10-CM | POA: Diagnosis not present

## 2019-07-11 DIAGNOSIS — M25552 Pain in left hip: Secondary | ICD-10-CM | POA: Insufficient documentation

## 2019-07-11 DIAGNOSIS — I272 Pulmonary hypertension, unspecified: Secondary | ICD-10-CM | POA: Diagnosis not present

## 2019-07-11 DIAGNOSIS — I5032 Chronic diastolic (congestive) heart failure: Secondary | ICD-10-CM

## 2019-07-11 DIAGNOSIS — Z7984 Long term (current) use of oral hypoglycemic drugs: Secondary | ICD-10-CM | POA: Insufficient documentation

## 2019-07-11 DIAGNOSIS — I2510 Atherosclerotic heart disease of native coronary artery without angina pectoris: Secondary | ICD-10-CM

## 2019-07-11 DIAGNOSIS — Z79899 Other long term (current) drug therapy: Secondary | ICD-10-CM | POA: Insufficient documentation

## 2019-07-11 DIAGNOSIS — I482 Chronic atrial fibrillation, unspecified: Secondary | ICD-10-CM | POA: Diagnosis not present

## 2019-07-11 DIAGNOSIS — Z951 Presence of aortocoronary bypass graft: Secondary | ICD-10-CM | POA: Insufficient documentation

## 2019-07-11 DIAGNOSIS — R0602 Shortness of breath: Secondary | ICD-10-CM | POA: Insufficient documentation

## 2019-07-11 MED ORDER — FUROSEMIDE 40 MG PO TABS: 80.0000 mg | ORAL_TABLET | Freq: Every day | ORAL | 6 refills | Status: DC

## 2019-07-11 NOTE — Patient Outreach (Signed)
[facility]) Care Management  07/11/2019  RORIE DELMORE 07-29-1941 [id number]   Call placed to member to follow up on visit to the ED.  Report she is still having back pain.  Was given Tramadol but will speak with Dr. Jana Hakim on Monday about something different for pain relief.  Has another visit scheduled for rituximab infusion next week.  Denies that she will see rheumatology again since oncologist is managing condition (vasculitis).    Was seen by heart failure clinic today, report increased weight as well as swelling in legs, feet, and abdomen.  Will have to increase her Tosemide to 37m twice a day for the next 2 days, then back to original dosage.  She will be checking her weights as well.  Follow up with clinic next Friday.    Continue to have support from her daughters, son, and neighbors but will still live on her own.  Denies any urgent concerns, aware that her case will be transitioned to [facility] manager.  Will provide update to newly assigned care manager for outreach within the next month.  [facility] CM Care Plan Problem One     Most Recent Value  Care Plan Problem One  Risk for hospitalization related to anemia as evidenced by recent hospital admission  Role Documenting the Problem One  Care Management [place] Problem One  Active  [facility]   Member will not be readmitted to hospital within the next 31 days  [facility] Long Term Goal Start Date  06/02/19  [facility] Met Date  07/11/19  [facility] LPCM Short Term Goal #1   Member will contact rheumatology office for sooner appointment within the next week  [facility] Goal #1 Start Date  06/02/19  [facility] Goal #1 Met Date  07/11/19     MValente David RN, MSN [id number] [phone]

## 2019-07-11 NOTE — Progress Notes (Signed)
Ms. Glade was seen in the infusion room today as she was receiving rituximab.  She is managed by Dr. Jana Hakim.  She has an extensive medical history including hypertension, coronary artery disease, aortic atherosclerosis, congestive heart failure, moderate to severe pulmonary hypertension, paroxysmal atrial fibrillation, aortic valve replacement, and chronic anticoagulation.  She is on clonidine, Lasix, tadalafil, spironolactone, metoprolol, and hydralazine.  She reports that she took all of her blood pressure medicines this morning.  This provider was asked to see the patient when it was noted that her blood pressure was 206/54.  She was given clonidine 0.1 p.o. x1.  Her blood pressure dropped to 190/52 and her rituximab was continued.  Her blood pressure was followed and dropped to 164/44, 159/50, and 127/72.  A second dose of clonidine had been ordered however it was not given when it was noted that the patient's blood pressure continued to drop.  After further investigation the patient reported that she on her own took a second dose of hydralazine 100 mg while she was receiving treatment.  She denied any issues of concern.  Her physical exam showed regular rate and rhythm with an audible artificial valve sound.  Her lungs were clear to auscultation bilaterally without wheezes rales or rhonchi.  She was instructed to continue to follow-up with her primary care provider.  Sandi Mealy, MHS, PA-C Physician Assistant

## 2019-07-11 NOTE — Progress Notes (Signed)
ReDS Vest - 07/11/19 0900      ReDS Vest   Fitting Posture  Sitting    Height Marker  --   Station B   Ruler Value  26.5    ReDS Value  39

## 2019-07-11 NOTE — Patient Instructions (Signed)
INCREASE Furosemide to 80mg  two times daily FOR TWO DAYS ONLY. THEN take Furosemide 80mg  daily.  Please follow up with the [facility] in 1 week and again in 2 months.  At the [facility], you and your health needs are our priority. As part of our continuing mission to provide you with exceptional heart care, we have created designated Provider Care Teams. These Care Teams include your primary Cardiologist (physician) and Advanced Practice Providers (APPs- Physician Assistants and Nurse Practitioners) who all work together to provide you with the care you need, when you need it.   You may see any of the following providers on your designated Care Team at your next follow up: [name] Dr Glori Bickers . Dr Loralie Champagne . [name], NP   Please be sure to bring in all your medications bottles to every appointment.

## 2019-07-13 NOTE — Progress Notes (Signed)
[facility]  [phone]) [phone] [phone]) [phone]    ID: [name] DOB: 1941/07/29  MR#: [id number]  SWN#:[id number]  Patient Care Team: [name], MD as PCP - General (Internal Medicine) [name], MD as PCP - Advanced Heart Failure (Cardiology) Lorretta Harp, MD as PCP - Cardiology (Cardiology) Shaquan Puerta, [name], MD as Consulting Physician (Hematology and Oncology) Arta Silence, MD as Consulting Physician (Gastroenterology) [name], MD as Consulting Physician (Cardiology) Marzetta Board, DPM as Consulting Physician (Podiatry) Gavin Pound, MD as Consulting Physician (Rheumatology) [name], [facility] as [place] Management (Pharmacist) Jon Billings, RN as [place] Management OTHER MD:   CHIEF COMPLAINT: anemia and thrombocytopenia  CURRENT TREATMENT: [Feraheme], Aranesp, prednisone, rituximab   INTERVAL HISTORY: [name] returns today for follow-up and treatment of her anemia and thrombocytopenia. She was last seen here on 07/08/2019.   She continues on aranesp.  We have been giving her the injection every 2 weeks and today, a week from the last injection she has a good reticulocyte count.  I think if we move it to weekly we can get her hemoglobin a little bit over 10 which is the goal.  Accordingly I am making this change.  She also continues on prednisone.  We had dropped her dose to 20 mg and she has tolerated it well.  Her hemoglobin has been stable.  I think we can drop the dose a little bit more and she will start at 15 mg today.  She also continues on rituximab. Today is day 1 cycle 6.  The first cycle was simply a test dose so she will receive a total of 9 doses before going on maintenance.  Since starting steroids on 05/31/2019 her Hb has stabilized: Results for [name] (MRN [id number]) as of 07/13/2019 22:30  Ref. Range 05/27/2019 04:02 05/28/2019 12:07  05/29/2019 04:20 05/30/2019 03:37 05/31/2019 07:28 06/05/2019 09:47 06/12/2019 08:53 06/20/2019 08:59 07/01/2019 11:33 07/07/2019 02:08 07/08/2019 08:37  Hemoglobin Latest Ref Range: 12.0 - 15.0 g/dL 6.9 (LL) 8.3 (L) 8.0 (L) 8.7 (L) 8.8 (L) 8.8 (L) 9.4 (L) 8.8 (L) 9.3 (L) 10.2 (L) 9.2 (L)   She has been transfusion free since   REVIEW OF SYSTEMS: [name] "pulled her back" trying to lift an enormous a water pitcher.  She says Tylenol does not help.  She is not a candidate for nonsteroidals because of her kidneys.  She says Dr. Osborne Casco gave her some tramadol and it helped.  She does not get constipated or confused from this medication.  She continues to live by herself but she says her son comes by once a week and 1 of her daughters comes by once a week.  She gets a neighbor to bring her here.  She has had no intercurrent fever, no obvious bleeding, no rash.  A detailed review of systems was otherwise stable   HISTORY OF CURRENT ILLNESS: From the original consult note:  [name] is an 78 y.o. female from [place], [place].  She has a past medical history significant forPAD s/p stenting; moderate to severe pulmonary HTN; afib and AVR on Coumadin; HTN; HLD; DM; and CAD s/p CABG. the patient was admitted to the hospital for anemia.  She was having increasing fatigue and shortness of breath and had routine lab work drawn which showed a hemoglobin of 6.6.  Due to her abnormal lab work, she was advised to come to the emergency room for  admission.  She was also admitted in February 2020 for symptomatic anemia.  She reports she has been anemic for at least a year and has been taking oral iron.  She underwent a colonoscopy and EGD on 01/22/2018.  The colonoscopy showed diverticulosis in the sigmoid colon and descending colon, internal hemorrhoids.  The upper endoscopy was normal except for erythematous mucosa in the antrum.  She had a repeat EGD on 11/21/2018 that showed a few minimally oozing( with scope  trauma) superficial gastric ulcers with pigmented material were found in the gastric body. The largest lesion was 4 mm in largest dimension.  She underwent another upper endoscopy on 02/27/2019 which showed patchy mildly friable mucosa with contact bleeding was found in the gastric fundus, in the gastric body and in the gastric antrum.  The patient is maintained on warfarin due to A. fib and a mechanical aortic valve.  Review of the patient's chart shows that she has been anemic dating back to at least December thousand 12 (these are the earliest labs available to me).  Her highest hemoglobin was 11.6 in December 2012.  I also note that the patient has had intermittent mild leukopenia adding back to at least April 2019.  Her lowest white blood cell count was 2.6 in April 2019.  Since admission, the patient has received 1 unit of packed red blood cells.  She is currently on heparin for her procedure with plan to bridge back to Coumadin.  Stool for occult blood was negative x1 on 02/24/2019.  Hematology was asked see the patient to make recommendations regarding her anemia.  [name] subsequent history is as detailed below.   PAST MEDICAL HISTORY: Past Medical History:  Diagnosis Date   Aortic atherosclerosis ([place]) 01/23/2018   Atrial fibrillation ([facility])    Carotid artery disease ([place])    Cholelithiasis 01/23/2018   Chronic anticoagulation    Coronary artery disease    status post coronary artery bypass grafting times 07/10/2004   Diabetes mellitus    Hypercholesteremia    Hypertension    Mechanical heart valve present    H. aortic valve replacement at the time of bypass surgery October 2005   Moderate to severe pulmonary hypertension ([place])    Peripheral arterial disease ([place])    history of left common iliac artery PTA and stenting for a chronic total occlusion 08/26/01     PAST SURGICAL HISTORY: Past Surgical History:  Procedure Laterality Date   AORTIC VALVE REPLACEMENT      CARDIAC CATHETERIZATION  11/10/2004   40% right common illiac, 70% in stent restenosis of distal left common illiac,    CARDIAC CATHETERIZATION  05/18/2004   LAD 50-70% midstenosis, RCA dominant w/50% stenosis, 50% Right common Illiac artery ostial stenosis, 90% in stent restenosis within midportion of left common illiac stent   Carotid Duplex  03/12/2012   RSA-elev. velocities suggestive of a 50-69% diameter reduction, Right&Left Bulb/Prox ICA-mild-mod.fibrous plaqueelevating Velocities abnormal study.   CHOLECYSTECTOMY N/A 03/01/2018   Procedure: LAPAROSCOPIC CHOLECYSTECTOMY;  Surgeon: Judeth Horn, MD;  Location: [place];  Service: General;  Laterality: N/A;   COLONOSCOPY WITH PROPOFOL N/A 01/22/2018   Procedure: COLONOSCOPY WITH PROPOFOL;  Surgeon: Wilford Corner, MD;  Location: [place];  Service: Endoscopy;  Laterality: N/A;   ESOPHAGOGASTRODUODENOSCOPY (EGD) WITH PROPOFOL N/A 01/22/2018   Procedure: ESOPHAGOGASTRODUODENOSCOPY (EGD) WITH PROPOFOL;  Surgeon: Wilford Corner, MD;  Location: [place];  Service: Endoscopy;  Laterality: N/A;   ESOPHAGOGASTRODUODENOSCOPY (EGD) WITH PROPOFOL N/A 11/21/2018   Procedure: ESOPHAGOGASTRODUODENOSCOPY (  EGD) WITH PROPOFOL;  Surgeon: [name], MD;  Location: [facility];  Service: Gastroenterology;  Laterality: N/A;   ESOPHAGOGASTRODUODENOSCOPY (EGD) WITH PROPOFOL Left 02/27/2019   Procedure: ESOPHAGOGASTRODUODENOSCOPY (EGD) WITH PROPOFOL;  Surgeon: Arta Silence, MD;  [facility];  Service: Endoscopy;  Laterality: Left;   GIVENS CAPSULE STUDY N/A 11/21/2018   Procedure: GIVENS CAPSULE STUDY;  Surgeon: [name], MD;  Location: [place];  Service: Gastroenterology;  Laterality: N/A;  To be deployed during EGD   Lower Ext. Duplex  03/12/2012   Right Proximal CIA- vessel narrowing w/elevated velocities 0-49% diameter reduction. Right SFA-mild mixed density plaque throughout vessel.   NM MYOCAR PERF WALL MOTION   05/19/2010   protocol: Persantine, post stress EF 65%, negative for ischemia, low risk scan   RIGHT HEART CATH N/A 06/27/2018   Procedure: RIGHT HEART CATH;  Surgeon: [name], MD;  Location: [facility];  Service: Cardiovascular;  Laterality: N/A;   TRANSTHORACIC ECHOCARDIOGRAM  08/29/2012   Moderately calcified annulus of mitral valve, moderate regurg. of both mitral valve and tricuspid valve.     FAMILY HISTORY: Family History  Problem Relation Age of Onset   Breast cancer Neg Hx     GYNECOLOGIC HISTORY:  No LMP recorded. Patient has had a hysterectomy. Menarche:  years old Age at first live birth:  [place] P: 3 LMP:  Contraceptive:  HRT:   Hysterectomy?: yes BSO?:    SOCIAL HISTORY: (Current as of 03/07/2019) [name] is widowed.  She lives by herself.  She has 3 children that live locally.  She smoked three quarters of a pack of cigarettes daily for 50 years.  She quit in 2005.  Denies alcohol use.   ADVANCED DIRECTIVES: Not in place    HEALTH MAINTENANCE: Social History   Tobacco Use   Smoking status: Former Smoker    Packs/day: 1.00    Years: 30.00    Pack years: 30.00    Types: Cigarettes   Smokeless tobacco: Never Used   Tobacco comment: quit smoking 2005  Substance Use Topics   Alcohol use: No    Alcohol/week: 0.0 standard drinks   Drug use: No    Colonoscopy: EGD on 02/27/2019 under Dr. Paulita Fujita found some friable gastric mucosa.  Colonoscopy under Dr. Michail Sermon 01/22/2018 showed significant diverticular disease  PAP:   Bone density:  Mammography: 02/25/2017, breast density category B.  Allergies  Allergen Reactions   Flagyl [Metronidazole] Rash    ALL-OVER BODY RASH   Coreg [Carvedilol] Other (See Comments)    Terrible cramping in the feet and had a lot of bowel movements, but not diarrhea   Losartan Swelling    Patient doesn't recall site of swelling   Verapamil Hives   Zetia [Ezetimibe] Other (See Comments)    Reaction not  recalled   Zocor [Simvastatin - High Dose] Other (See Comments)    Reaction not recalled    Current Outpatient Medications  Medication Sig Dispense Refill   acetaminophen (TYLENOL) 325 MG tablet Take 650 mg by mouth every 6 (six) hours as needed for mild pain.      allopurinol (ZYLOPRIM) 100 MG tablet Take 100 mg by mouth daily.     ALPRAZolam (XANAX) 0.5 MG tablet Take 0.25 mg by mouth at bedtime.      atorvastatin (LIPITOR) 80 MG tablet Take 1 tablet (80 mg total) by mouth daily at 6 PM.     Cholecalciferol (VITAMIN D3) 1000 UNITS CAPS Take 2,000 Units by mouth daily with  lunch.      cloNIDine (CATAPRES) 0.1 MG tablet Take 1 tablet (0.1 mg total) by mouth 2 (two) times daily. 60 tablet 1   Cyanocobalamin (VITAMIN B12) 1000 MCG TBCR Take 1,000 mcg by mouth daily with lunch.      feeding supplement, GLUCERNA SHAKE, (GLUCERNA SHAKE) LIQD Take 237 mLs by mouth 2 (two) times daily between meals. 237 mL 30   furosemide (LASIX) 40 MG tablet Take 2 tablets (80 mg total) by mouth daily. 180 tablet 6   hydrALAZINE (APRESOLINE) 100 MG tablet Take 1 tablet (100 mg total) by mouth 3 (three) times daily. 270 tablet 1   Magnesium 500 MG TABS Take 500 mg by mouth daily.     magnesium oxide (MAG-OX) 400 (241.3 Mg) MG tablet Take 1 tablet (400 mg total) by mouth daily.     metFORMIN (GLUCOPHAGE) 500 MG tablet Take 500 mg by mouth 2 (two) times daily with a meal.      metoprolol tartrate (LOPRESSOR) 25 MG tablet Take 0.5 tablets (12.5 mg total) by mouth 2 (two) times daily. 60 tablet 0   ondansetron (ZOFRAN ODT) 4 MG disintegrating tablet Take 1 tablet (4 mg total) by mouth every 8 (eight) hours as needed for nausea or vomiting. 6 tablet 0   pantoprazole (PROTONIX) 40 MG tablet Take 40 mg by mouth 2 (two) times daily.      predniSONE (DELTASONE) 5 MG tablet Take 3 tablets (15 mg total) by mouth daily with breakfast. 90 tablet 2   riTUXimab U1324 (RITUXAN) 10 MG/ML injection Inject into the  vein every Tuesday.      spironolactone (ALDACTONE) 25 MG tablet Take 25 mg by mouth daily.     tadalafil, PAH, (ADCIRCA) 20 MG tablet Take 2 tablets (40 mg total) by mouth daily. 60 tablet 6   traMADol (ULTRAM) 50 MG tablet Take 1 tablet (50 mg total) by mouth every 6 (six) hours as needed. 15 tablet 0   vitamin C (ASCORBIC ACID) 500 MG tablet Take 500 mg by mouth daily with lunch.     warfarin (COUMADIN) 5 MG tablet Take 5-7.5 mg by mouth daily. Take 1.5 tablets (7.5 mg) on Tues, Thursday, Saturday and Sunday. Take 1 tablet (5mg ) on Mondays, Wednesdays, and Fridays.     No current facility-administered medications for this visit.      OBJECTIVE: Older white woman examined in a wheelchair  Vitals:   07/14/19 0935  BP: (!) 145/45  Pulse: (!) 58  Resp: 17  Temp: 98.3 F (36.8 C)  SpO2: 99%   Wt Readings from Last 3 Encounters:  07/14/19 125 lb 3.2 oz (56.8 kg)  07/11/19 125 lb 6 oz (56.9 kg)  07/08/19 125 lb 9.6 oz (57 kg)   Body mass index is 22.9 kg/m.    ECOG FS:2 - Symptomatic, <50% confined to bed  Ocular: Sclerae unicteric, pupils round and equal Ear-nose-throat: Wearing a mask Lymphatic: No cervical or supraclavicular adenopathy Lungs no rales or rhonchi Heart regular rate and rhythm Abd soft, nontender, positive bowel sounds Neuro: non-focal, well-oriented, appropriate affect Breasts: Deferred   LAB RESULTS:  CMP     Component Value Date/Time   NA 130 (L) 07/14/2019 0903   NA 137 12/06/2017 1436   K 3.8 07/14/2019 0903   CL 96 (L) 07/14/2019 0903   CO2 24 07/14/2019 0903   GLUCOSE 255 (H) 07/14/2019 0903   BUN 60 (H) 07/14/2019 0903   BUN 20 12/06/2017 1436   CREATININE 1.89 (H)  07/14/2019 0903   CALCIUM 8.2 (L) 07/14/2019 0903   PROT 5.3 (L) 07/14/2019 0903   ALBUMIN 2.9 (L) 07/14/2019 0903   AST 23 07/14/2019 0903   ALT 16 07/14/2019 0903   ALKPHOS 91 07/14/2019 0903   BILITOT 0.5 07/14/2019 0903   GFRNONAA 25 (L) 07/14/2019 0903   GFRAA  29 (L) 07/14/2019 0903    Lab Results  Component Value Date   TOTALPROTELP 6.9 05/07/2019    No results found for: KPAFRELGTCHN, LAMBDASER, [facility]  Lab Results  Component Value Date   WBC 4.6 07/14/2019   NEUTROABS 4.0 07/14/2019   HGB 9.4 (L) 07/14/2019   HCT 28.5 (L) 07/14/2019   MCV 91.3 07/14/2019   PLT 132 (L) 07/14/2019    @LASTCHEMISTRY @  No results found for: LABCA2  No components found for: [id number]  No results for input(s): INR in the last 168 hours.  No results found for: LABCA2  No results found for: XQJ194  No results found for: RDE081  No results found for: KGY185  No results found for: CA2729  No components found for: HGQUANT  No results found for: CEA1 / No results found for: CEA1   No results found for: AFPTUMOR  No results found for: CHROMOGRNA  No results found for: PSA1  Appointment on 07/14/2019  Component Date Value Ref Range Status   Sodium 07/14/2019 130* 135 - 145 mmol/L Final   Potassium 07/14/2019 3.8  3.5 - 5.1 mmol/L Final   Chloride 07/14/2019 96* 98 - 111 mmol/L Final   CO2 07/14/2019 24  22 - 32 mmol/L Final   Glucose, Bld 07/14/2019 255* 70 - 99 mg/dL Final   BUN 07/14/2019 60* 8 - 23 mg/dL Final   Creatinine 07/14/2019 1.89* 0.44 - 1.00 mg/dL Final   Calcium 07/14/2019 8.2* 8.9 - 10.3 mg/dL Final   Total Protein 07/14/2019 5.3* 6.5 - 8.1 g/dL Final   Albumin 07/14/2019 2.9* 3.5 - 5.0 g/dL Final   AST 07/14/2019 23  15 - 41 U/L Final   ALT 07/14/2019 16  0 - 44 U/L Final   Alkaline Phosphatase 07/14/2019 91  38 - 126 U/L Final   Total Bilirubin 07/14/2019 0.5  0.3 - 1.2 mg/dL Final   GFR, Est Non Af Am 07/14/2019 25* >60 mL/min Final   GFR, Est AFR Am 07/14/2019 29* >60 mL/min Final   Anion gap 07/14/2019 10  5 - 15 Final   Performed at [facility], [place] [address], [place], [place] [address]   Smear Review 07/14/2019 SMEAR STAINED AND AVAILABLE FOR REVIEW   Final    Performed at [facility], [place] [address] [place], [place] [address]   Retic Ct Pct 07/14/2019 3.7* 0.4 - 3.1 % Final   RBC. 07/14/2019 3.08* 3.87 - 5.11 MIL/uL Final   Retic Count, Absolute 07/14/2019 113.7  19.0 - 186.0 K/uL Final   Immature Retic Fract 07/14/2019 24.7* 2.3 - 15.9 % Final   Performed at [facility], [place] [address] [place], [place] [address]   WBC 07/14/2019 4.6  4.0 - 10.5 K/uL Final   RBC 07/14/2019 3.12* 3.87 - 5.11 MIL/uL Final   Hemoglobin 07/14/2019 9.4* 12.0 - 15.0 g/dL Final   HCT 07/14/2019 28.5* 36.0 - 46.0 % Final   MCV 07/14/2019 91.3  80.0 - 100.0 fL Final   MCH 07/14/2019 30.1  26.0 - 34.0 pg Final   MCHC 07/14/2019 33.0  30.0 - 36.0 g/dL Final  RDW 07/14/2019 17.2* 11.5 - 15.5 % Final   Platelets 07/14/2019 132* 150 - 400 K/uL Final   nRBC 07/14/2019 0.0  0.0 - 0.2 % Final   Neutrophils Relative % 07/14/2019 77  % Final   Neutro Abs 07/14/2019 4.0  1.7 - 17.7 K/uL Final   Band Neutrophils 07/14/2019 9  % Final   Lymphocytes Relative 07/14/2019 7  % Final   Lymphs Abs 07/14/2019 0.3* 0.7 - 4.0 K/uL Final   Monocytes Relative 07/14/2019 3  % Final   Monocytes Absolute 07/14/2019 0.1  0.1 - 1.0 K/uL Final   Eosinophils Relative 07/14/2019 1  % Final   Eosinophils Absolute 07/14/2019 0.0  0.0 - 0.5 K/uL Final   Basophils Relative 07/14/2019 1  % Final   Basophils Absolute 07/14/2019 0.0  0.0 - 0.1 K/uL Final   Metamyelocytes Relative 07/14/2019 2  % Final   Abs Immature Granulocytes 07/14/2019 0.10* 0.00 - 0.07 K/uL Final   Ovalocytes 07/14/2019 PRESENT   Final   Performed at [facility], [place] [address], [place], [place] [address]    (this displays the last labs from the last 3 days)  Lab Results  Component Value Date   TOTALPROTELP 6.9 05/07/2019   (this displays SPEP labs)  No results found for: KPAFRELGTCHN, LAMBDASER,  KAPLAMBRATIO (kappa/lambda light chains)  No results found for: HGBA, HGBA2QUANT, HGBFQUANT, HGBSQUAN (Hemoglobinopathy evaluation)   Lab Results  Component Value Date   LDH 197 (H) 02/27/2019    Lab Results  Component Value Date   IRON 43 07/08/2019   TIBC 224 (L) 07/08/2019   IRONPCTSAT 19 (L) 07/08/2019   (Iron and TIBC)  Lab Results  Component Value Date   FERRITIN 147 07/08/2019    Urinalysis    Component Value Date/Time   COLORURINE YELLOW 07/07/2019 0209   APPEARANCEUR CLOUDY (A) 07/07/2019 0209   LABSPEC 1.011 07/07/2019 0209   PHURINE 6.0 07/07/2019 0209   GLUCOSEU NEGATIVE 07/07/2019 0209   HGBUR NEGATIVE 07/07/2019 0209   BILIRUBINUR NEGATIVE 07/07/2019 [place] 07/07/2019 0209   PROTEINUR >=300 (A) 07/07/2019 0209   UROBILINOGEN 1.0 10/06/2011 1709   NITRITE NEGATIVE 07/07/2019 0209   LEUKOCYTESUR LARGE (A) 07/07/2019 0209     STUDIES:  Dg Chest 2 View  Result Date: 07/07/2019 CLINICAL DATA:  Edema possible compression deformity EXAM: CHEST - 2 VIEW COMPARISON:  Radiograph May 28, 2019, CT December 12, 2018 FINDINGS: Postsurgical changes related to prior CABG including intact and aligned sternotomy wires and multiple surgical clips projecting over the mediastinum. Cephalized indistinct pulmonary vascularity with interlobular septal and fissural thickening and hazy interstitial opacities in lung bases. Stable calcified granulomata are present in the lungs. There is blunting of the costophrenic sulcus which could reflect either flattening of the diaphragms or trace effusion. Multilevel degenerative changes are present in the spine. No distinct compression deformity is identified however the bones are diffusely osteopenic and superimposition of scapula a may limit detection of the upper thoracic levels. IMPRESSION: 1. Postsurgical changes related to prior CABG. 2. Findings consistent with mild to moderate pulmonary edema. 3. No distinct  compression deformity is identified however osteopenia and superimposed scapula may limit detection of subtle fractures. Electronically Signed   By: Lovena Le M.D.   On: 07/07/2019 02:30   Ct L-spine No Charge  Result Date: 07/07/2019 CLINICAL DATA:  Compression fracture EXAM: CT LUMBAR SPINE WITHOUT CONTRAST TECHNIQUE: Multidetector CT imaging of the lumbar spine was performed without intravenous  contrast administration. Multiplanar CT image reconstructions were also generated. COMPARISON:  Abdominal CT 01/18/2018 FINDINGS: Segmentation: 5 lumbar type vertebrae Alignment: Lower lumbar dextroscoliosis and upper lumbar levoscoliosis. No traumatic malalignment. Vertebrae: L4 superior endplate fracture that has occurred since comparison but is chronic appearing. There may have also been a left L5 transverse process fracture in the interim. No acute fracture. No evident bone lesion or erosion Paraspinal and other soft tissues: Reported separately Disc levels: T12- L1: Left-sided disc narrowing and facet spurring with moderate left foraminal stenosis. L1-L2: Mild disc narrowing and bulging.  Mild facet spurring. L2-L3: Disc narrowing and bulging with facet spurring causing mild bilateral foraminal stenosis. L3-L4: Disc narrowing and bulging. Degenerative facet spurring that is moderate. Moderate right more than left foraminal narrowing. Moderate or advanced spinal stenosis. L4-L5: Asymmetric left-sided degenerative disc collapse and ridging. Asymmetric left facet spurring. Left foraminal impingement. Mild spinal stenosis. L5-S1:Greatest level of degenerative disc narrowing with spurring. Mild facet degeneration. The canal and foramina appear patent. IMPRESSION: 1. No acute finding. 2. L4 superior endplate fracture that has occurred since 2019 but is chronic appearing. 3. Degenerative disease with scoliosis as described. Electronically Signed   By: Monte Fantasia M.D.   On: 07/07/2019 04:55   Ct Renal Stone  Study  Result Date: 07/07/2019 CLINICAL DATA:  Flank pain with stone disease suspected EXAM: CT ABDOMEN AND PELVIS WITHOUT CONTRAST TECHNIQUE: Multidetector CT imaging of the abdomen and pelvis was performed following the standard protocol without IV contrast. COMPARISON:  02/22/2018 FINDINGS: Lower chest: Mild atelectasis at the lung bases. Trace bilateral pleural fluid. Calcified granuloma in the lingula. Hepatobiliary: No focal liver abnormality.Cholecystectomy. No common bile duct dilatation. Pancreas: Unremarkable. Spleen: Calcifications that are likely both arterial and granulomatous. Adrenals/Urinary Tract: Negative adrenals. No hydronephrosis or stone. Moderate gas within the bladder lumen, please correlate with urinalysis. Stomach/Bowel: No obstruction. No evidence of inflammation. Scattered stool without colonic over distension or rectal impaction. Distal colonic diverticulosis. Vascular/Lymphatic: No acute vascular abnormality. Widespread atherosclerotic calcification with narrow appearance of the bilateral iliacs. A left iliac stent is present. No mass or adenopathy. Reproductive:Hysterectomy with possible oophorectomies. Other: No significant ascites or pneumoperitoneum. Musculoskeletal: No acute abnormalities. Spinal degeneration with scoliosis as described on dedicated study. Advanced right hip osteoarthritis. Osteitis pubis. Generalized osteopenia. IMPRESSION: 1. No hydronephrosis or urolithiasis. 2. Moderate gas in the urinary bladder, please correlate with urinalysis. 3. Extensive atherosclerosis. Electronically Signed   By: Monte Fantasia M.D.   On: 07/07/2019 05:24     ELIGIBLE FOR AVAILABLE RESEARCH PROTOCOL: no   ASSESSMENT: 78 y.o. [name] woman with multifactorial anemia, as follows:             (a) anemia of chronic inflammation: As of May 2020 she has a SED rate of 95, CRP 1.1, positive ANA and RF; subsequently she was found to be ANCA positive             (b) hemolysis  (mild): she has a mildly elevated LDH and a DAT positive for complement, not IgG; this is c/w (a) above             (c) anemia of renal insufficiency: inadequate EPO resonse to anemia              (d) GIB/ iron deficiency in patient on lifelong anticoagulaton  (1) Feraheme received 03/01/2019, repeated 03/20/2019   (a) reticulocyte 03/07/2019 up from 43.4 a year ago to 191.8 after iron infusion  (b) feraheme repeated on 05/19/2019  (2) darbepoetin:  starting 05/05/2019 at 143mcg given every 2 weeks  (a) dose increased to 200 mcg every 2 weeks starting 07/01/2019.    (3) ANCA positive vasculitis: diagnosed 05/08/2019 (titer 1:640)  (a) prednisone 40 mg daily started 05/31/2019, on taper  (b) rituximab weekly started 06/12/2019 (received test dose 06/05/2019   (i) hepatitis B sAg, core Ab and HIV negative 05/08/2019  (4) osteopenia with a T score of -1.5 on bone density 02/28/2016 at the breast center    PLAN: Shaheen has not required a blood transfusion since August.  She is having a good response to steroids and rituximab.  At this point I think we can go down to 15 mg on the prednisone and I wrote the order to make that easier for her.  She will be using 5 mg tablets.  She will receive Rituxan today and weekly for another 3 doses after which we will go to monthly x2 and then every 2 months thereafter for a year.  This is of course assuming her hemoglobin holds.  I am increasing her Aranesp to weekly.  If we can keep a reticulocyte count greater than 100 she will get her hemoglobin between 10-11 which is our goal.  We can then increase the dose further and decrease the dosing interval as appropriate  I am pleased at how well Teyanna is doing.  At the next visit we will again review advanced directives.  Gillie Crisci, [name], MD  07/14/19 10:06 AM Medical Oncology and Hematology [facility] [place], [place] [address] Tel. [phone]    Fax. [phone]  I,  Jacqualyn Posey am acting as a Education administrator for Chauncey Cruel, MD.   I, Lurline Del MD, have reviewed the above documentation for accuracy and completeness, and I agree with the above.

## 2019-07-14 ENCOUNTER — Inpatient Hospital Stay: Payer: Medicare HMO

## 2019-07-14 ENCOUNTER — Inpatient Hospital Stay: Payer: Medicare HMO | Attending: Oncology

## 2019-07-14 ENCOUNTER — Inpatient Hospital Stay (HOSPITAL_BASED_OUTPATIENT_CLINIC_OR_DEPARTMENT_OTHER): Payer: Medicare HMO | Admitting: Oncology

## 2019-07-14 ENCOUNTER — Other Ambulatory Visit: Payer: Self-pay

## 2019-07-14 ENCOUNTER — Telehealth: Payer: Self-pay | Admitting: *Deleted

## 2019-07-14 VITALS — BP 145/45 | HR 58 | Temp 98.3°F | Resp 17 | Ht 62.0 in | Wt 125.2 lb

## 2019-07-14 VITALS — BP 167/45 | HR 63 | Temp 98.0°F | Resp 16

## 2019-07-14 DIAGNOSIS — D696 Thrombocytopenia, unspecified: Secondary | ICD-10-CM | POA: Insufficient documentation

## 2019-07-14 DIAGNOSIS — D508 Other iron deficiency anemias: Secondary | ICD-10-CM

## 2019-07-14 DIAGNOSIS — Z5112 Encounter for antineoplastic immunotherapy: Secondary | ICD-10-CM | POA: Diagnosis not present

## 2019-07-14 DIAGNOSIS — D61818 Other pancytopenia: Secondary | ICD-10-CM

## 2019-07-14 DIAGNOSIS — N183 Chronic kidney disease, stage 3 unspecified: Secondary | ICD-10-CM | POA: Insufficient documentation

## 2019-07-14 DIAGNOSIS — M8580 Other specified disorders of bone density and structure, unspecified site: Secondary | ICD-10-CM | POA: Insufficient documentation

## 2019-07-14 DIAGNOSIS — Z87891 Personal history of nicotine dependence: Secondary | ICD-10-CM | POA: Insufficient documentation

## 2019-07-14 DIAGNOSIS — Z7984 Long term (current) use of oral hypoglycemic drugs: Secondary | ICD-10-CM | POA: Diagnosis not present

## 2019-07-14 DIAGNOSIS — D701 Agranulocytosis secondary to cancer chemotherapy: Secondary | ICD-10-CM | POA: Diagnosis not present

## 2019-07-14 DIAGNOSIS — I776 Arteritis, unspecified: Secondary | ICD-10-CM

## 2019-07-14 DIAGNOSIS — Z7901 Long term (current) use of anticoagulants: Secondary | ICD-10-CM | POA: Diagnosis not present

## 2019-07-14 DIAGNOSIS — I7782 Antineutrophilic cytoplasmic antibody (ANCA) vasculitis: Secondary | ICD-10-CM

## 2019-07-14 DIAGNOSIS — D594 Other nonautoimmune hemolytic anemias: Secondary | ICD-10-CM

## 2019-07-14 DIAGNOSIS — Z79899 Other long term (current) drug therapy: Secondary | ICD-10-CM | POA: Insufficient documentation

## 2019-07-14 DIAGNOSIS — D631 Anemia in chronic kidney disease: Secondary | ICD-10-CM | POA: Insufficient documentation

## 2019-07-14 DIAGNOSIS — I739 Peripheral vascular disease, unspecified: Secondary | ICD-10-CM

## 2019-07-14 LAB — CBC WITH DIFFERENTIAL/PLATELET
Abs Immature Granulocytes: 0.1 10*3/uL — ABNORMAL HIGH (ref 0.00–0.07)
Band Neutrophils: 9 %
Basophils Absolute: 0 10*3/uL (ref 0.0–0.1)
Basophils Relative: 1 %
Eosinophils Absolute: 0 10*3/uL (ref 0.0–0.5)
Eosinophils Relative: 1 %
HCT: 28.5 % — ABNORMAL LOW (ref 36.0–46.0)
Hemoglobin: 9.4 g/dL — ABNORMAL LOW (ref 12.0–15.0)
Lymphocytes Relative: 7 %
Lymphs Abs: 0.3 10*3/uL — ABNORMAL LOW (ref 0.7–4.0)
MCH: 30.1 pg (ref 26.0–34.0)
MCHC: 33 g/dL (ref 30.0–36.0)
MCV: 91.3 fL (ref 80.0–100.0)
Metamyelocytes Relative: 2 %
Monocytes Absolute: 0.1 10*3/uL (ref 0.1–1.0)
Monocytes Relative: 3 %
Neutro Abs: 4 10*3/uL (ref 1.7–17.7)
Neutrophils Relative %: 77 %
Platelets: 132 10*3/uL — ABNORMAL LOW (ref 150–400)
RBC: 3.12 MIL/uL — ABNORMAL LOW (ref 3.87–5.11)
RDW: 17.2 % — ABNORMAL HIGH (ref 11.5–15.5)
WBC: 4.6 10*3/uL (ref 4.0–10.5)
nRBC: 0 % (ref 0.0–0.2)

## 2019-07-14 LAB — CMP (CANCER CENTER ONLY)
ALT: 16 U/L (ref 0–44)
AST: 23 U/L (ref 15–41)
Albumin: 2.9 g/dL — ABNORMAL LOW (ref 3.5–5.0)
Alkaline Phosphatase: 91 U/L (ref 38–126)
Anion gap: 10 (ref 5–15)
BUN: 60 mg/dL — ABNORMAL HIGH (ref 8–23)
CO2: 24 mmol/L (ref 22–32)
Calcium: 8.2 mg/dL — ABNORMAL LOW (ref 8.9–10.3)
Chloride: 96 mmol/L — ABNORMAL LOW (ref 98–111)
Creatinine: 1.89 mg/dL — ABNORMAL HIGH (ref 0.44–1.00)
GFR, Est AFR Am: 29 mL/min — ABNORMAL LOW (ref >60)
GFR, Estimated: 25 mL/min — ABNORMAL LOW (ref >60)
Glucose, Bld: 255 mg/dL — ABNORMAL HIGH (ref 70–99)
Potassium: 3.8 mmol/L (ref 3.5–5.1)
Sodium: 130 mmol/L — ABNORMAL LOW (ref 135–145)
Total Bilirubin: 0.5 mg/dL (ref 0.3–1.2)
Total Protein: 5.3 g/dL — ABNORMAL LOW (ref 6.5–8.1)

## 2019-07-14 LAB — IRON AND TIBC
Iron: 30 ug/dL — ABNORMAL LOW (ref 41–142)
Saturation Ratios: 13 % — ABNORMAL LOW (ref 21–57)
TIBC: 233 ug/dL — ABNORMAL LOW (ref 236–444)
UIBC: 203 ug/dL (ref 120–384)

## 2019-07-14 LAB — RETICULOCYTES
Immature Retic Fract: 24.7 % — ABNORMAL HIGH (ref 2.3–15.9)
RBC.: 3.08 MIL/uL — ABNORMAL LOW (ref 3.87–5.11)
Retic Count, Absolute: 113.7 10*3/uL (ref 19.0–186.0)
Retic Ct Pct: 3.7 % — ABNORMAL HIGH (ref 0.4–3.1)

## 2019-07-14 LAB — SAVE SMEAR(SSMR), FOR PROVIDER SLIDE REVIEW

## 2019-07-14 LAB — FERRITIN: Ferritin: 134 ng/mL (ref 11–307)

## 2019-07-14 MED ORDER — ACETAMINOPHEN 325 MG PO TABS
650.0000 mg | ORAL_TABLET | Freq: Once | ORAL | Status: AC
  Administered 2019-07-14: 11:00:00 650 mg via ORAL

## 2019-07-14 MED ORDER — TRAMADOL HCL 50 MG PO TABS: 50.0000 mg | ORAL_TABLET | Freq: Four times a day (QID) | ORAL | 0 refills | Status: DC | PRN

## 2019-07-14 MED ORDER — SODIUM CHLORIDE 0.9 % IV SOLN
375.0000 mg/m2 | Freq: Once | INTRAVENOUS | Status: AC
  Administered 2019-07-14: 600 mg via INTRAVENOUS
  Filled 2019-07-14: qty 10

## 2019-07-14 MED ORDER — SODIUM CHLORIDE 0.9 % IV SOLN
Freq: Once | INTRAVENOUS | Status: AC
  Administered 2019-07-14: 11:00:00 via INTRAVENOUS
  Filled 2019-07-14: qty 250

## 2019-07-14 MED ORDER — ACETAMINOPHEN 325 MG PO TABS
ORAL_TABLET | ORAL | Status: AC
  Filled 2019-07-14: qty 2

## 2019-07-14 MED ORDER — DIPHENHYDRAMINE HCL 25 MG PO CAPS
25.0000 mg | ORAL_CAPSULE | Freq: Once | ORAL | Status: AC
  Administered 2019-07-14: 11:00:00 25 mg via ORAL

## 2019-07-14 MED ORDER — DIPHENHYDRAMINE HCL 25 MG PO CAPS
ORAL_CAPSULE | ORAL | Status: AC
  Filled 2019-07-14: qty 1

## 2019-07-14 MED ORDER — PREDNISONE 5 MG PO TABS: 15.0000 mg | ORAL_TABLET | Freq: Every day | ORAL | 2 refills | Status: DC

## 2019-07-14 NOTE — Patient Instructions (Signed)
[facility] Discharge Instructions for Patients Receiving Chemotherapy  Today you received the following chemotherapy agents:  Rituximab  To help prevent nausea and vomiting after your treatment, we encourage you to take your nausea medication as prescribed.   If you develop nausea and vomiting that is not controlled by your nausea medication, call the clinic.   BELOW ARE SYMPTOMS THAT SHOULD BE REPORTED IMMEDIATELY:  *FEVER GREATER THAN 100.5 F  *CHILLS WITH OR WITHOUT FEVER  NAUSEA AND VOMITING THAT IS NOT CONTROLLED WITH YOUR NAUSEA MEDICATION  *UNUSUAL SHORTNESS OF BREATH  *UNUSUAL BRUISING OR BLEEDING  TENDERNESS IN MOUTH AND THROAT WITH OR WITHOUT PRESENCE OF ULCERS  *URINARY PROBLEMS  *BOWEL PROBLEMS  UNUSUAL RASH Items with * indicate a potential emergency and should be followed up as soon as possible.  Feel free to call the clinic should you have any questions or concerns. The clinic phone number is [phone].  Please show the [facility] at check-in to the Emergency Department and triage nurse.   Darbepoetin Alfa injection What is this medicine? DARBEPOETIN ALFA (dar be POE e tin AL fa) helps your body make more red blood cells. It is used to treat anemia caused by chronic kidney failure and chemotherapy. This medicine may be used for other purposes; ask your health care provider or pharmacist if you have questions. COMMON BRAND NAME(S): Aranesp What should I tell my health care provider before I take this medicine? They need to know if you have any of these conditions:  blood clotting disorders or history of blood clots  cancer patient not on chemotherapy  cystic fibrosis  heart disease, such as angina, heart failure, or a history of a heart attack  hemoglobin level of 12 g/dL or greater  high blood pressure  low levels of folate, iron, or vitamin B12  seizures  an unusual or allergic reaction to darbepoetin,  erythropoietin, albumin, hamster proteins, latex, other medicines, foods, dyes, or preservatives  pregnant or trying to get pregnant  breast-feeding How should I use this medicine? This medicine is for injection into a vein or under the skin. It is usually given by a health care professional in a hospital or clinic setting. If you get this medicine at home, you will be taught how to prepare and give this medicine. Use exactly as directed. Take your medicine at regular intervals. Do not take your medicine more often than directed. It is important that you put your used needles and syringes in a special sharps container. Do not put them in a trash can. If you do not have a sharps container, call your pharmacist or healthcare provider to get one. A special MedGuide will be given to you by the pharmacist with each prescription and refill. Be sure to read this information carefully each time. Talk to your pediatrician regarding the use of this medicine in children. While this medicine may be used in children as young as 78 month of age for selected conditions, precautions do apply. Overdosage: If you think you have taken too much of this medicine contact a poison control center or emergency room at once. NOTE: This medicine is only for you. Do not share this medicine with others. What if I miss a dose? If you miss a dose, take it as soon as you can. If it is almost time for your next dose, take only that dose. Do not take double or extra doses. What may interact with this medicine? Do not take  this medicine with any of the following medications:  epoetin alfa This list may not describe all possible interactions. Give your health care provider a list of all the medicines, herbs, non-prescription drugs, or dietary supplements you use. Also tell them if you smoke, drink alcohol, or use illegal drugs. Some items may interact with your medicine. What should I watch for while using this medicine? Your  condition will be monitored carefully while you are receiving this medicine. You may need blood work done while you are taking this medicine. This medicine may cause a decrease in vitamin B6. You should make sure that you get enough vitamin B6 while you are taking this medicine. Discuss the foods you eat and the vitamins you take with your health care professional. What side effects may I notice from receiving this medicine? Side effects that you should report to your doctor or health care professional as soon as possible:  allergic reactions like skin rash, itching or hives, swelling of the face, lips, or tongue  breathing problems  changes in vision  chest pain  confusion, trouble speaking or understanding  feeling faint or lightheaded, falls  high blood pressure  muscle aches or pains  pain, swelling, warmth in the leg  rapid weight gain  severe headaches  sudden numbness or weakness of the face, arm or leg  trouble walking, dizziness, loss of balance or coordination  seizures (convulsions)  swelling of the ankles, feet, hands  unusually weak or tired Side effects that usually do not require medical attention (report to your doctor or health care professional if they continue or are bothersome):  diarrhea  fever, chills (flu-like symptoms)  headaches  nausea, vomiting  redness, stinging, or swelling at site where injected This list may not describe all possible side effects. Call your doctor for medical advice about side effects. You may report side effects to FDA at 1-800-FDA-1088. Where should I keep my medicine? Keep out of the reach of children. Store in a refrigerator between 2 and 8 degrees C (36 and 46 degrees F). Do not freeze. Do not shake. Throw away any unused portion if using a single-dose vial. Throw away any unused medicine after the expiration date. NOTE: This sheet is a summary. It may not cover all possible information. If you have questions  about this medicine, talk to your doctor, pharmacist, or health care provider.  2020 Elsevier/Gold Standard (2017-10-10 16:44:20)

## 2019-07-14 NOTE — Telephone Encounter (Signed)
Per MD proceed with Rituxin with noted creatinine of 1.89

## 2019-07-17 ENCOUNTER — Ambulatory Visit: Payer: Medicare HMO | Admitting: *Deleted

## 2019-07-18 ENCOUNTER — Encounter (HOSPITAL_COMMUNITY): Payer: Self-pay

## 2019-07-18 ENCOUNTER — Ambulatory Visit (HOSPITAL_COMMUNITY)
Admission: RE | Admit: 2019-07-18 | Discharge: 2019-07-18 | Disposition: A | Discharge status: Home / Self Care | Payer: Medicare HMO | Source: Ambulatory Visit | Attending: Internal Medicine | Admitting: Internal Medicine

## 2019-07-18 ENCOUNTER — Other Ambulatory Visit: Payer: Self-pay

## 2019-07-18 DIAGNOSIS — I5032 Chronic diastolic (congestive) heart failure: Secondary | ICD-10-CM | POA: Insufficient documentation

## 2019-07-18 LAB — BASIC METABOLIC PANEL
Anion gap: 10 (ref 5–15)
BUN: 49 mg/dL — ABNORMAL HIGH (ref 8–23)
CO2: 24 mmol/L (ref 22–32)
Calcium: 8.5 mg/dL — ABNORMAL LOW (ref 8.9–10.3)
Chloride: 99 mmol/L (ref 98–111)
Creatinine, Ser: 1.51 mg/dL — ABNORMAL HIGH (ref 0.44–1.00)
GFR calc Af Amer: 37 mL/min — ABNORMAL LOW (ref >60)
GFR calc non Af Amer: 32 mL/min — ABNORMAL LOW (ref >60)
Glucose, Bld: 292 mg/dL — ABNORMAL HIGH (ref 70–99)
Potassium: 3.8 mmol/L (ref 3.5–5.1)
Sodium: 133 mmol/L — ABNORMAL LOW (ref 135–145)

## 2019-07-18 MED ORDER — SPIRONOLACTONE 25 MG PO TABS: 25.0000 mg | ORAL_TABLET | Freq: Every day | ORAL | 3 refills | Status: DC

## 2019-07-18 MED ORDER — FUROSEMIDE 40 MG PO TABS: ORAL_TABLET | ORAL | 3 refills | Status: DC

## 2019-07-18 MED ORDER — CLONIDINE HCL 0.2 MG PO TABS: 0.2000 mg | ORAL_TABLET | Freq: Two times a day (BID) | ORAL | 3 refills | Status: DC

## 2019-07-18 MED ORDER — FUROSEMIDE 40 MG PO TABS: 80.0000 mg | ORAL_TABLET | Freq: Every day | ORAL | 6 refills | Status: DC

## 2019-07-18 NOTE — Progress Notes (Signed)
Pt arrived for nurse visit. Pt states she feels well except her back hurting. Pt reviewed meds with RN and has been following all medication recommendations. Pt states that she has not noticed a difference in her leg swelling. bilat leg swelling 2+ pitting edema. bp elevated at 180/78 with second person recheck. Other vitals within normal limits. Weight stable at 125.4lbs. ReDS vest reading 39%, same as 07/11/19. Reviewed assessment with Dr. Aundra Dubin. Per Dr. Aundra Dubin pt to increase Furosemide to 80mg  qam and 40mg  every evening with lab work in 1 week. Also increase clonidine to 0.2mg  bid. Pt agreeable to med changes and lab appointment made in visit. Pt reminded about upcoming follow up visit. Send scripts to [facility] per pt's request. Pt complained that she didn't want scripts to be sent to [facility] unless she needs to pick it up urgently. No further questions/ concerns at this time.

## 2019-07-18 NOTE — Patient Instructions (Addendum)
Lab work done today. We will notify you if you have any abnormal lab work. No news is good news!  Lab work in 1 week.  INCREASE Furosemide 80mg  every morning and 40mg  every evening.  INCREASE Clonidine to 0.2mg  tab two times daily.  Please keep follow up appointment on December 3rd.  At the [facility], you and your health needs are our priority. As part of our continuing mission to provide you with exceptional heart care, we have created designated Provider Care Teams. These Care Teams include your primary Cardiologist (physician) and Advanced Practice Providers (APPs- Physician Assistants and Nurse Practitioners) who all work together to provide you with the care you need, when you need it.   You may see any of the following providers on your designated Care Team at your next follow up: [name] Dr Glori Bickers . Dr Loralie Champagne . [name], NP . Lyda Jester, PA   Please be sure to bring in all your medications bottles to every appointment.

## 2019-07-18 NOTE — Progress Notes (Signed)
ReDS Vest - 07/18/19 1051      ReDS Vest   Estimated volume prior to reading  Med    Fitting Posture  Sitting    Height Marker  Short    Ruler Value  26    Center Strip  Aligned    ReDS Value  39    Anatomical Comments  station B

## 2019-07-18 NOTE — Addendum Note (Signed)
Encounter addended by: Scarlette Calico, RN on: 07/18/2019 5:00 PM  Actions taken: Pharmacy for encounter modified, Order list changed

## 2019-07-20 NOTE — Progress Notes (Signed)
[place]  [phone] [phone]    ID: [name] DOB: [birth year]  MR#: [id number]  WLN#:[id number]  Patient Care Team: [name], MD as PCP - General (Internal Medicine) [name], MD as PCP - Advanced Heart Failure (Cardiology) Lorretta Harp, MD as PCP - Cardiology (Cardiology) Jamica Woodyard, [name], MD as Consulting Physician (Hematology and Oncology) Arta Silence, MD as Consulting Physician (Gastroenterology) [name], MD as Consulting Physician (Cardiology) Marzetta Board, DPM as Consulting Physician (Podiatry) Gavin Pound, MD as Consulting Physician (Rheumatology) [name], [facility] as Tool Management (Pharmacist) Jon Billings, RN as Gilbert Management OTHER MD:   CHIEF COMPLAINT: anemia and thrombocytopenia  CURRENT TREATMENT: [Feraheme], Aranesp, prednisone, [rituximab]   INTERVAL HISTORY: [name] returns today for follow-up and treatment of her anemia and thrombocytopenia. She was last seen here on 07/14/2019.   She continues on aranesp, currently at 200 mcg weekly. Her reticulocytes may be trending up: Results for [name] (MRN [id number]) as of 07/21/2019 08:40  Ref. Range 06/12/2019 08:54 06/20/2019 08:53 07/01/2019 11:33 07/08/2019 08:38 07/14/2019 09:03  Retic Count, Absolute Latest Ref Range: 19.0 - 186.0 K/uL 54.8 100.1 80.8 65.9 113.7   She also continues on prednisone. Dose was decreased to 15 mg/d a week ago.  She also continues on rituximab. Today would be day 1 cycle 7.  However her white cell count is very low, doubtless secondary to the rituximab and this is being held.  Since starting steroids on 05/31/2019 her Hgb has stabilized: Lab Results  Component Value Date   HGB 9.2 (L) 07/21/2019   HGB 9.4 (L) 07/14/2019   HGB 9.2 (L) 07/08/2019   HGB 10.2 (L) 07/07/2019   HGB 9.3 (L) 07/01/2019   She also receive erythropoietin  on a weekly basis.  She has a dose due today   REVIEW OF SYSTEMS: [name] tells me "things are about the same".  She still feels pretty tired.  She has occasional good days, for instance last Saturday, when she had more energy.  She has not had any intercurrent fevers, bleeding, rash, or problems with cough phlegm production pleurisy shortness of breath or change in bowel habits or bladder habits.  She denies drenching sweats.  Review of systems today was otherwise stable.   HISTORY OF CURRENT ILLNESS: From the original consult note:  [name] is an 78 y.o. female from [place], [place].  She has a past medical history significant forPAD s/p stenting; moderate to severe pulmonary HTN; afib and AVR on Coumadin; HTN; HLD; DM; and CAD s/p CABG. the patient was admitted to the hospital for anemia.  She was having increasing fatigue and shortness of breath and had routine lab work drawn which showed a hemoglobin of 6.6.  Due to her abnormal lab work, she was advised to come to the emergency room for admission.  She was also admitted in February 2020 for symptomatic anemia.  She reports she has been anemic for at least a year and has been taking oral iron.  She underwent a colonoscopy and EGD on 01/22/2018.  The colonoscopy showed diverticulosis in the sigmoid colon and descending colon, internal hemorrhoids.  The upper endoscopy was normal except for erythematous mucosa in the antrum.  She had a repeat EGD on 11/21/2018 that showed a few minimally oozing( with scope trauma) superficial gastric ulcers with pigmented material were found in the gastric body. The largest lesion was 4  mm in largest dimension.  She underwent another upper endoscopy on 02/27/2019 which showed patchy mildly friable mucosa with contact bleeding was found in the gastric fundus, in the gastric body and in the gastric antrum.  The patient is maintained on warfarin due to A. fib and a mechanical aortic valve.  Review of the  patient's chart shows that she has been anemic dating back to at least December thousand 12 (these are the earliest labs available to me).  Her highest hemoglobin was 11.6 in December 2012.  I also note that the patient has had intermittent mild leukopenia adding back to at least April 2019.  Her lowest white blood cell count was 2.6 in April 2019.  Since admission, the patient has received 1 unit of packed red blood cells.  She is currently on heparin for her procedure with plan to bridge back to Coumadin.  Stool for occult blood was negative x1 on 02/24/2019.  Hematology was asked see the patient to make recommendations regarding her anemia.  [name] subsequent history is as detailed below.   PAST MEDICAL HISTORY: Past Medical History:  Diagnosis Date   Aortic atherosclerosis ([place]) 01/23/2018   Atrial fibrillation (HCC)    Carotid artery disease ([place])    Cholelithiasis 01/23/2018   Chronic anticoagulation    Coronary artery disease    status post coronary artery bypass grafting times 07/10/2004   Diabetes mellitus    Hypercholesteremia    Hypertension    Mechanical heart valve present    H. aortic valve replacement at the time of bypass surgery October 2005   Moderate to severe pulmonary hypertension ([place])    Peripheral arterial disease ([place])    history of left common iliac artery PTA and stenting for a chronic total occlusion 08/26/01     PAST SURGICAL HISTORY: Past Surgical History:  Procedure Laterality Date   AORTIC VALVE REPLACEMENT     CARDIAC CATHETERIZATION  11/10/2004   40% right common illiac, 70% in stent restenosis of distal left common illiac,    CARDIAC CATHETERIZATION  05/18/2004   LAD 50-70% midstenosis, RCA dominant w/50% stenosis, 50% Right common Illiac artery ostial stenosis, 90% in stent restenosis within midportion of left common illiac stent   Carotid Duplex  03/12/2012   RSA-elev. velocities suggestive of a 50-69% diameter reduction,  Right&Left Bulb/Prox ICA-mild-mod.fibrous plaqueelevating Velocities abnormal study.   CHOLECYSTECTOMY N/A 03/01/2018   Procedure: LAPAROSCOPIC CHOLECYSTECTOMY;  Surgeon: Judeth Horn, MD;  Location: [place];  Service: General;  Laterality: N/A;   COLONOSCOPY WITH PROPOFOL N/A 01/22/2018   Procedure: COLONOSCOPY WITH PROPOFOL;  Surgeon: Wilford Corner, MD;  Location: [place];  Service: Endoscopy;  Laterality: N/A;   ESOPHAGOGASTRODUODENOSCOPY (EGD) WITH PROPOFOL N/A 01/22/2018   Procedure: ESOPHAGOGASTRODUODENOSCOPY (EGD) WITH PROPOFOL;  Surgeon: Wilford Corner, MD;  Location: [place];  Service: Endoscopy;  Laterality: N/A;   ESOPHAGOGASTRODUODENOSCOPY (EGD) WITH PROPOFOL N/A 11/21/2018   Procedure: ESOPHAGOGASTRODUODENOSCOPY (EGD) WITH PROPOFOL;  Surgeon: [name], MD;  Location: [place];  Service: Gastroenterology;  Laterality: N/A;   ESOPHAGOGASTRODUODENOSCOPY (EGD) WITH PROPOFOL Left 02/27/2019   Procedure: ESOPHAGOGASTRODUODENOSCOPY (EGD) WITH PROPOFOL;  Surgeon: Arta Silence, MD;  [facility] END[facility]COPY;  Service: Endoscopy;  Laterality: Left;   GIVENS CAPSULE STUDY N/A 11/21/2018   Procedure: GIVENS CAPSULE STUDY;  Surgeon: [name], MD;  Location: [place];  Service: Gastroenterology;  Laterality: N/A;  To be deployed during EGD   Lower Ext. Duplex  03/12/2012   Right Proximal CIA- vessel narrowing w/elevated velocities 0-49% diameter  reduction. Right SFA-mild mixed density plaque throughout vessel.   NM MYOCAR PERF WALL MOTION  05/19/2010   protocol: Persantine, post stress EF 65%, negative for ischemia, low risk scan   RIGHT HEART CATH N/A 06/27/2018   Procedure: RIGHT HEART CATH;  Surgeon: [name], MD;  Location: [place] CV LAB;  Service: Cardiovascular;  Laterality: N/A;   TRANSTHORACIC ECHOCARDIOGRAM  08/29/2012   Moderately calcified annulus of mitral valve, moderate regurg. of both mitral valve and tricuspid valve.     FAMILY  HISTORY: Family History  Problem Relation Age of Onset   Breast cancer Neg Hx     GYNECOLOGIC HISTORY:  No LMP recorded. Patient has had a hysterectomy. Menarche:  years old Age at first live birth:  [place] P: 3 LMP:  Contraceptive:  HRT:   Hysterectomy?: yes BSO?:    SOCIAL HISTORY: (Current as of 03/07/2019) [name] is widowed.  She lives by herself.  She has 3 children that live locally.  She smoked three quarters of a pack of cigarettes daily for 50 years.  She quit in 2005.  Denies alcohol use.   ADVANCED DIRECTIVES: Not in place    HEALTH MAINTENANCE: Social History   Tobacco Use   Smoking status: Former Smoker    Packs/day: 1.00    Years: 30.00    Pack years: 30.00    Types: Cigarettes   Smokeless tobacco: Never Used   Tobacco comment: quit smoking 2005  Substance Use Topics   Alcohol use: No    Alcohol/week: 0.0 standard drinks   Drug use: No    Colonoscopy: EGD on 02/27/2019 under Dr. Paulita Fujita found some friable gastric mucosa.  Colonoscopy under Dr. Michail Sermon 01/22/2018 showed significant diverticular disease  PAP:   Bone density:  Mammography: 02/25/2017, breast density category B.  Allergies  Allergen Reactions   Flagyl [Metronidazole] Rash    ALL-OVER BODY RASH   Coreg [Carvedilol] Other (See Comments)    Terrible cramping in the feet and had a lot of bowel movements, but not diarrhea   Losartan Swelling    Patient doesn't recall site of swelling   Verapamil Hives   Zetia [Ezetimibe] Other (See Comments)    Reaction not recalled   Zocor [Simvastatin - High Dose] Other (See Comments)    Reaction not recalled    Current Outpatient Medications  Medication Sig Dispense Refill   acetaminophen (TYLENOL) 325 MG tablet Take 650 mg by mouth every 6 (six) hours as needed for mild pain.      allopurinol (ZYLOPRIM) 100 MG tablet Take 100 mg by mouth daily.     ALPRAZolam (XANAX) 0.5 MG tablet Take 0.25 mg by mouth at bedtime.      atorvastatin  (LIPITOR) 80 MG tablet Take 1 tablet (80 mg total) by mouth daily at 6 PM.     Cholecalciferol (VITAMIN D3) 1000 UNITS CAPS Take 2,000 Units by mouth daily with lunch.      ciprofloxacin (CIPRO) 250 MG tablet Take 1 tablet (250 mg total) by mouth 2 (two) times daily. 12 tablet 0   cloNIDine (CATAPRES) 0.2 MG tablet Take 1 tablet (0.2 mg total) by mouth 2 (two) times daily. 60 tablet 3   Cyanocobalamin (VITAMIN B12) 1000 MCG TBCR Take 1,000 mcg by mouth daily with lunch.      feeding supplement, GLUCERNA SHAKE, (GLUCERNA SHAKE) LIQD Take 237 mLs by mouth 2 (two) times daily between meals. 237 mL 30   furosemide (LASIX) 40 MG tablet Take 2 tablets (  80 mg total) by mouth every morning AND 1 tablet (40 mg total) every evening. 90 tablet 3   hydrALAZINE (APRESOLINE) 100 MG tablet Take 1 tablet (100 mg total) by mouth 3 (three) times daily. 270 tablet 1   Magnesium 500 MG TABS Take 500 mg by mouth daily.     magnesium oxide (MAG-OX) 400 (241.3 Mg) MG tablet Take 1 tablet (400 mg total) by mouth daily.     metFORMIN (GLUCOPHAGE) 500 MG tablet Take 500 mg by mouth 2 (two) times daily with a meal.      metoprolol tartrate (LOPRESSOR) 25 MG tablet Take 0.5 tablets (12.5 mg total) by mouth 2 (two) times daily. 60 tablet 0   ondansetron (ZOFRAN ODT) 4 MG disintegrating tablet Take 1 tablet (4 mg total) by mouth every 8 (eight) hours as needed for nausea or vomiting. 6 tablet 0   pantoprazole (PROTONIX) 40 MG tablet Take 40 mg by mouth 2 (two) times daily.      predniSONE (DELTASONE) 5 MG tablet Take 3 tablets (15 mg total) by mouth daily with breakfast. 90 tablet 2   riTUXimab F5732 (RITUXAN) 10 MG/ML injection Inject into the vein every Tuesday.      spironolactone (ALDACTONE) 25 MG tablet Take 1 tablet (25 mg total) by mouth daily. 90 tablet 3   tadalafil, PAH, (ADCIRCA) 20 MG tablet Take 2 tablets (40 mg total) by mouth daily. 60 tablet 6   traMADol (ULTRAM) 50 MG tablet Take 1 tablet (50  mg total) by mouth every 6 (six) hours as needed. 15 tablet 0   vitamin C (ASCORBIC ACID) 500 MG tablet Take 500 mg by mouth daily with lunch.     warfarin (COUMADIN) 5 MG tablet Take 5-7.5 mg by mouth daily. Take 1.5 tablets (7.5 mg) on Tues, Thursday, Saturday and Sunday. Take 1 tablet (5mg ) on Mondays, Wednesdays, and Fridays.     No current facility-administered medications for this visit.      OBJECTIVE: Older white woman examined in a wheelchair  Vitals:   07/21/19 0929  BP: (!) 138/42  Pulse: (!) 53  Resp: 18  Temp: 99.1 F (37.3 C)  SpO2: 99%   Wt Readings from Last 3 Encounters:  07/21/19 126 lb 8 oz (57.4 kg)  07/18/19 125 lb 6.4 oz (56.9 kg)  07/14/19 125 lb 3.2 oz (56.8 kg)   Body mass index is 23.14 kg/m.    ECOG FS:2 - Symptomatic, <50% confined to bed  Ocular: Sclerae unicteric, pupils round and equal Ear-nose-throat: Wearing a mask Lungs no rales or rhonchi Heart regular rate and rhythm Abd soft, nontender, positive bowel sounds MSK no focal spinal tenderness, no joint edema Neuro: non-focal, well-oriented, appropriate affect Breasts:    LAB RESULTS:  CMP     Component Value Date/Time   NA 133 (L) 07/18/2019 1032   NA 137 12/06/2017 1436   K 3.8 07/18/2019 1032   CL 99 07/18/2019 1032   CO2 24 07/18/2019 1032   GLUCOSE 292 (H) 07/18/2019 1032   BUN 49 (H) 07/18/2019 1032   BUN 20 12/06/2017 1436   CREATININE 1.51 (H) 07/18/2019 1032   CREATININE 1.89 (H) 07/14/2019 0903   CALCIUM 8.5 (L) 07/18/2019 1032   PROT 5.3 (L) 07/14/2019 0903   ALBUMIN 2.9 (L) 07/14/2019 0903   AST 23 07/14/2019 0903   ALT 16 07/14/2019 0903   ALKPHOS 91 07/14/2019 0903   BILITOT 0.5 07/14/2019 0903   GFRNONAA 32 (L) 07/18/2019 1032   GFRNONAA  25 (L) 07/14/2019 0903   GFRAA 37 (L) 07/18/2019 1032   GFRAA 29 (L) 07/14/2019 0903    Lab Results  Component Value Date   TOTALPROTELP 6.9 05/07/2019    No results found for: KPAFRELGTCHN, LAMBDASER,  [facility]  Lab Results  Component Value Date   WBC 0.8 (LL) 07/21/2019   NEUTROABS 0.0 (LL) 07/21/2019   HGB 9.2 (L) 07/21/2019   HCT 27.4 (L) 07/21/2019   MCV 89.0 07/21/2019   PLT 122 (L) 07/21/2019    @LASTCHEMISTRY @  No results found for: LABCA2  No components found for: [id number]  No results for input(s): INR in the last 168 hours.  No results found for: LABCA2  No results found for: IWP809  No results found for: XIP382  No results found for: NKN397  No results found for: CA2729  No components found for: HGQUANT  No results found for: CEA1 / No results found for: CEA1   No results found for: AFPTUMOR  No results found for: CHROMOGRNA  No results found for: PSA1  Appointment on 07/21/2019  Component Date Value Ref Range Status   WBC 07/21/2019 0.8* 4.0 - 10.5 K/uL Final   Comment: This critical result has verified and been called to VAL DODD, RN by Modena Slater on 10 12 2020 at [phone], and has been read back.  This critical result has verified and been called to VAL DODD, RN by Modena Slater on 10 12 2020 at [phone], and has been read back.     RBC 07/21/2019 3.08* 3.87 - 5.11 MIL/uL Final   Hemoglobin 07/21/2019 9.2* 12.0 - 15.0 g/dL Final   HCT 07/21/2019 27.4* 36.0 - 46.0 % Final   MCV 07/21/2019 89.0  80.0 - 100.0 fL Final   MCH 07/21/2019 29.9  26.0 - 34.0 pg Final   MCHC 07/21/2019 33.6  30.0 - 36.0 g/dL Final   RDW 07/21/2019 16.7* 11.5 - 15.5 % Final   Platelets 07/21/2019 122* 150 - 400 K/uL Final   nRBC 07/21/2019 0.0  0.0 - 0.2 % Final   Neutrophils Relative % 07/21/2019 3  % Final   Neutro Abs 07/21/2019 0.0* 1.7 - 7.7 K/uL Final   This critical result has verified and been called to VAL DODD, RN by Modena Slater on 10 12 2020 at 0927, and has been read back.    Lymphocytes Relative 07/21/2019 54  % Final   Lymphs Abs 07/21/2019 0.4* 0.7 - 4.0 K/uL Final   Monocytes Relative 07/21/2019 36  % Final   Monocytes Absolute  07/21/2019 0.3  0.1 - 1.0 K/uL Final   Eosinophils Relative 07/21/2019 4  % Final   Eosinophils Absolute 07/21/2019 0.0  0.0 - 0.5 K/uL Final   Basophils Relative 07/21/2019 3  % Final   Basophils Absolute 07/21/2019 0.0  0.0 - 0.1 K/uL Final   Immature Granulocytes 07/21/2019 0  % Final   Abs Immature Granulocytes 07/21/2019 0.00  0.00 - 0.07 K/uL Final   Performed at [facility], [place] [address], [place], [place] [address]   Retic Ct Pct 07/21/2019 2.5  0.4 - 3.1 % Final   RBC. 07/21/2019 3.10* 3.87 - 5.11 MIL/uL Final   Retic Count, Absolute 07/21/2019 76.9  19.0 - 186.0 K/uL Final   Immature Retic Fract 07/21/2019 11.5  2.3 - 15.9 % Final   Performed at [name], [address], [place], [place] [address]   Smear Review 07/21/2019 SMEAR STAINED AND AVAILABLE FOR REVIEW  Final   Performed at [facility], [place] [address] [place], [place] [address]    (this displays the last labs from the last 3 days)  Lab Results  Component Value Date   TOTALPROTELP 6.9 05/07/2019   (this displays SPEP labs)  No results found for: KPAFRELGTCHN, LAMBDASER, KAPLAMBRATIO (kappa/lambda light chains)  No results found for: HGBA, HGBA2QUANT, HGBFQUANT, HGBSQUAN (Hemoglobinopathy evaluation)   Lab Results  Component Value Date   LDH 197 (H) 02/27/2019    Lab Results  Component Value Date   IRON 30 (L) 07/14/2019   TIBC 233 (L) 07/14/2019   IRONPCTSAT 13 (L) 07/14/2019   (Iron and TIBC)  Lab Results  Component Value Date   FERRITIN 134 07/14/2019    Urinalysis    Component Value Date/Time   COLORURINE YELLOW 07/07/2019 0209   APPEARANCEUR CLOUDY (A) 07/07/2019 0209   LABSPEC 1.011 07/07/2019 0209   PHURINE 6.0 07/07/2019 0209   GLUCOSEU NEGATIVE 07/07/2019 0209   HGBUR NEGATIVE 07/07/2019 0209   BILIRUBINUR NEGATIVE 07/07/2019 [place] 07/07/2019 0209   PROTEINUR >=300 (A)  07/07/2019 0209   UROBILINOGEN 1.0 10/06/2011 1709   NITRITE NEGATIVE 07/07/2019 0209   LEUKOCYTESUR LARGE (A) 07/07/2019 0209     STUDIES:  Dg Chest 2 View  Result Date: 07/07/2019 CLINICAL DATA:  Edema possible compression deformity EXAM: CHEST - 2 VIEW COMPARISON:  Radiograph May 28, 2019, CT December 12, 2018 FINDINGS: Postsurgical changes related to prior CABG including intact and aligned sternotomy wires and multiple surgical clips projecting over the mediastinum. Cephalized indistinct pulmonary vascularity with interlobular septal and fissural thickening and hazy interstitial opacities in lung bases. Stable calcified granulomata are present in the lungs. There is blunting of the costophrenic sulcus which could reflect either flattening of the diaphragms or trace effusion. Multilevel degenerative changes are present in the spine. No distinct compression deformity is identified however the bones are diffusely osteopenic and superimposition of scapula a may limit detection of the upper thoracic levels. IMPRESSION: 1. Postsurgical changes related to prior CABG. 2. Findings consistent with mild to moderate pulmonary edema. 3. No distinct compression deformity is identified however osteopenia and superimposed scapula may limit detection of subtle fractures. Electronically Signed   By: Lovena Le M.D.   On: 07/07/2019 02:30   Ct L-spine No Charge  Result Date: 07/07/2019 CLINICAL DATA:  Compression fracture EXAM: CT LUMBAR SPINE WITHOUT CONTRAST TECHNIQUE: Multidetector CT imaging of the lumbar spine was performed without intravenous contrast administration. Multiplanar CT image reconstructions were also generated. COMPARISON:  Abdominal CT 01/18/2018 FINDINGS: Segmentation: 5 lumbar type vertebrae Alignment: Lower lumbar dextroscoliosis and upper lumbar levoscoliosis. No traumatic malalignment. Vertebrae: L4 superior endplate fracture that has occurred since comparison but is chronic appearing. There  may have also been a left L5 transverse process fracture in the interim. No acute fracture. No evident bone lesion or erosion Paraspinal and other soft tissues: Reported separately Disc levels: T12- L1: Left-sided disc narrowing and facet spurring with moderate left foraminal stenosis. L1-L2: Mild disc narrowing and bulging.  Mild facet spurring. L2-L3: Disc narrowing and bulging with facet spurring causing mild bilateral foraminal stenosis. L3-L4: Disc narrowing and bulging. Degenerative facet spurring that is moderate. Moderate right more than left foraminal narrowing. Moderate or advanced spinal stenosis. L4-L5: Asymmetric left-sided degenerative disc collapse and ridging. Asymmetric left facet spurring. Left foraminal impingement. Mild spinal stenosis. L5-S1:Greatest level of degenerative disc narrowing with spurring. Mild facet degeneration. The canal and foramina appear  patent. IMPRESSION: 1. No acute finding. 2. L4 superior endplate fracture that has occurred since 2019 but is chronic appearing. 3. Degenerative disease with scoliosis as described. Electronically Signed   By: Monte Fantasia M.D.   On: 07/07/2019 04:55   Ct Renal Stone Study  Result Date: 07/07/2019 CLINICAL DATA:  Flank pain with stone disease suspected EXAM: CT ABDOMEN AND PELVIS WITHOUT CONTRAST TECHNIQUE: Multidetector CT imaging of the abdomen and pelvis was performed following the standard protocol without IV contrast. COMPARISON:  02/22/2018 FINDINGS: Lower chest: Mild atelectasis at the lung bases. Trace bilateral pleural fluid. Calcified granuloma in the lingula. Hepatobiliary: No focal liver abnormality.Cholecystectomy. No common bile duct dilatation. Pancreas: Unremarkable. Spleen: Calcifications that are likely both arterial and granulomatous. Adrenals/Urinary Tract: Negative adrenals. No hydronephrosis or stone. Moderate gas within the bladder lumen, please correlate with urinalysis. Stomach/Bowel: No obstruction. No evidence  of inflammation. Scattered stool without colonic over distension or rectal impaction. Distal colonic diverticulosis. Vascular/Lymphatic: No acute vascular abnormality. Widespread atherosclerotic calcification with narrow appearance of the bilateral iliacs. A left iliac stent is present. No mass or adenopathy. Reproductive:Hysterectomy with possible oophorectomies. Other: No significant ascites or pneumoperitoneum. Musculoskeletal: No acute abnormalities. Spinal degeneration with scoliosis as described on dedicated study. Advanced right hip osteoarthritis. Osteitis pubis. Generalized osteopenia. IMPRESSION: 1. No hydronephrosis or urolithiasis. 2. Moderate gas in the urinary bladder, please correlate with urinalysis. 3. Extensive atherosclerosis. Electronically Signed   By: Monte Fantasia M.D.   On: 07/07/2019 05:24     ELIGIBLE FOR AVAILABLE RESEARCH PROTOCOL: no   ASSESSMENT: 78 y.o. [place] woman with multifactorial anemia, as follows:             (a) anemia of chronic inflammation: As of May 2020 she has a SED rate of 95, CRP 1.1, positive ANA and RF; subsequently she was found to be ANCA positive             (b) hemolysis (mild): she has a mildly elevated LDH and a DAT positive for complement, not IgG; this is c/w (a) above             (c) anemia of renal insufficiency: inadequate EPO resonse to anemia              (d) GIB/ iron deficiency in patient on lifelong anticoagulaton  (1) Feraheme received 03/01/2019, repeated 03/20/2019   (a) reticulocyte 03/07/2019 up from 43.4 a year ago to 191.8 after iron infusion  (b) feraheme repeated on 05/19/2019  (2) darbepoetin: starting 05/05/2019 at 144mcg given every 2 weeks  (a) dose increased to 200 mcg every 2 weeks starting 07/01/2019.    (3) ANCA positive vasculitis: diagnosed 05/08/2019 (titer 1:640)  (a) prednisone 40 mg daily started 05/31/2019, on taper  (b) rituximab weekly started 06/12/2019 (received test dose 06/05/2019   (i)  hepatitis B sAg, core Ab and HIV negative 05/08/2019   (ii) rituximab held as of 07/21/2019 with neutropenia  (4) osteopenia with a T score of -1.5 on bone density 02/28/2016 at the breast center    PLAN: [name] has tolerated the rituximab with no symptoms that she is aware of, but she has become neutropenic.  This is unknown, but recognized complication of this medication.  We are holding the rituximab and she will receive growth factor today.  I am also starting her on prophylactic ciprofloxacin to take for the next 6 days.  She will receive her Aranesp today and next week.  I am not dropping the prednisone further, it  is currently at 15 mg daily.  With this treatment she has not required a transfusion for approximately 2 months  Hopefully we can resume the rituximab at some point when her counts fully recover.  The neutropenia does not always recur with additional doses  I have asked her to make sure to call us immediately if she has a fever or any new symptoms of concern.  [name], [name], MD  07/21/19 9:42 AM Medical Oncology and Hematology [facility] [place], [place] [address] Tel. [phone]    Fax. [phone]  I, [name] am acting as a Education administrator for Chauncey Cruel, MD.   I, [name] Del MD, have reviewed the above documentation for accuracy and completeness, and I agree with the above.

## 2019-07-21 ENCOUNTER — Inpatient Hospital Stay: Payer: Medicare HMO

## 2019-07-21 ENCOUNTER — Inpatient Hospital Stay (HOSPITAL_BASED_OUTPATIENT_CLINIC_OR_DEPARTMENT_OTHER): Payer: Medicare HMO | Admitting: Oncology

## 2019-07-21 ENCOUNTER — Other Ambulatory Visit: Payer: Medicare HMO

## 2019-07-21 ENCOUNTER — Ambulatory Visit: Payer: Medicare HMO | Admitting: Adult Health

## 2019-07-21 ENCOUNTER — Ambulatory Visit: Payer: Medicare HMO

## 2019-07-21 ENCOUNTER — Other Ambulatory Visit: Payer: Self-pay

## 2019-07-21 DIAGNOSIS — D500 Iron deficiency anemia secondary to blood loss (chronic): Secondary | ICD-10-CM

## 2019-07-21 DIAGNOSIS — Z7901 Long term (current) use of anticoagulants: Secondary | ICD-10-CM | POA: Diagnosis not present

## 2019-07-21 DIAGNOSIS — T451X5A Adverse effect of antineoplastic and immunosuppressive drugs, initial encounter: Secondary | ICD-10-CM | POA: Insufficient documentation

## 2019-07-21 DIAGNOSIS — D701 Agranulocytosis secondary to cancer chemotherapy: Secondary | ICD-10-CM

## 2019-07-21 DIAGNOSIS — I776 Arteritis, unspecified: Secondary | ICD-10-CM | POA: Diagnosis not present

## 2019-07-21 DIAGNOSIS — I739 Peripheral vascular disease, unspecified: Secondary | ICD-10-CM

## 2019-07-21 DIAGNOSIS — Z7984 Long term (current) use of oral hypoglycemic drugs: Secondary | ICD-10-CM | POA: Diagnosis not present

## 2019-07-21 DIAGNOSIS — D696 Thrombocytopenia, unspecified: Secondary | ICD-10-CM | POA: Diagnosis not present

## 2019-07-21 DIAGNOSIS — D631 Anemia in chronic kidney disease: Secondary | ICD-10-CM | POA: Diagnosis not present

## 2019-07-21 DIAGNOSIS — N183 Chronic kidney disease, stage 3 unspecified: Secondary | ICD-10-CM | POA: Diagnosis not present

## 2019-07-21 DIAGNOSIS — M8580 Other specified disorders of bone density and structure, unspecified site: Secondary | ICD-10-CM | POA: Diagnosis not present

## 2019-07-21 DIAGNOSIS — Z5112 Encounter for antineoplastic immunotherapy: Secondary | ICD-10-CM | POA: Diagnosis not present

## 2019-07-21 DIAGNOSIS — D508 Other iron deficiency anemias: Secondary | ICD-10-CM

## 2019-07-21 DIAGNOSIS — D594 Other nonautoimmune hemolytic anemias: Secondary | ICD-10-CM

## 2019-07-21 HISTORY — DX: Adverse effect of antineoplastic and immunosuppressive drugs, initial encounter: T45.1X5A

## 2019-07-21 HISTORY — DX: Adverse effect of antineoplastic and immunosuppressive drugs, initial encounter: D70.1

## 2019-07-21 LAB — CMP (CANCER CENTER ONLY)
ALT: 12 U/L (ref 0–44)
AST: 19 U/L (ref 15–41)
Albumin: 2.8 g/dL — ABNORMAL LOW (ref 3.5–5.0)
Alkaline Phosphatase: 94 U/L (ref 38–126)
Anion gap: 14 (ref 5–15)
BUN: 54 mg/dL — ABNORMAL HIGH (ref 8–23)
CO2: 22 mmol/L (ref 22–32)
Calcium: 8.2 mg/dL — ABNORMAL LOW (ref 8.9–10.3)
Chloride: 97 mmol/L — ABNORMAL LOW (ref 98–111)
Creatinine: 1.52 mg/dL — ABNORMAL HIGH (ref 0.44–1.00)
GFR, Est AFR Am: 37 mL/min — ABNORMAL LOW (ref >60)
GFR, Estimated: 32 mL/min — ABNORMAL LOW (ref >60)
Glucose, Bld: 272 mg/dL — ABNORMAL HIGH (ref 70–99)
Potassium: 4.2 mmol/L (ref 3.5–5.1)
Sodium: 133 mmol/L — ABNORMAL LOW (ref 135–145)
Total Bilirubin: 0.5 mg/dL (ref 0.3–1.2)
Total Protein: 5.2 g/dL — ABNORMAL LOW (ref 6.5–8.1)

## 2019-07-21 LAB — IRON AND TIBC
Iron: 27 ug/dL — ABNORMAL LOW (ref 41–142)
Saturation Ratios: 11 % — ABNORMAL LOW (ref 21–57)
TIBC: 246 ug/dL (ref 236–444)
UIBC: 219 ug/dL (ref 120–384)

## 2019-07-21 LAB — CBC WITH DIFFERENTIAL/PLATELET
Abs Immature Granulocytes: 0 10*3/uL (ref 0.00–0.07)
Basophils Absolute: 0 10*3/uL (ref 0.0–0.1)
Basophils Relative: 3 %
Eosinophils Absolute: 0 10*3/uL (ref 0.0–0.5)
Eosinophils Relative: 4 %
HCT: 27.4 % — ABNORMAL LOW (ref 36.0–46.0)
Hemoglobin: 9.2 g/dL — ABNORMAL LOW (ref 12.0–15.0)
Immature Granulocytes: 0 %
Lymphocytes Relative: 54 %
Lymphs Abs: 0.4 10*3/uL — ABNORMAL LOW (ref 0.7–4.0)
MCH: 29.9 pg (ref 26.0–34.0)
MCHC: 33.6 g/dL (ref 30.0–36.0)
MCV: 89 fL (ref 80.0–100.0)
Monocytes Absolute: 0.3 10*3/uL (ref 0.1–1.0)
Monocytes Relative: 36 %
Neutro Abs: 0 10*3/uL — CL (ref 1.7–7.7)
Neutrophils Relative %: 3 %
Platelets: 122 10*3/uL — ABNORMAL LOW (ref 150–400)
RBC: 3.08 MIL/uL — ABNORMAL LOW (ref 3.87–5.11)
RDW: 16.7 % — ABNORMAL HIGH (ref 11.5–15.5)
WBC: 0.8 10*3/uL — CL (ref 4.0–10.5)
nRBC: 0 % (ref 0.0–0.2)

## 2019-07-21 LAB — SAVE SMEAR(SSMR), FOR PROVIDER SLIDE REVIEW

## 2019-07-21 LAB — RETICULOCYTES
Immature Retic Fract: 11.5 % (ref 2.3–15.9)
RBC.: 3.1 MIL/uL — ABNORMAL LOW (ref 3.87–5.11)
Retic Count, Absolute: 76.9 10*3/uL (ref 19.0–186.0)
Retic Ct Pct: 2.5 % (ref 0.4–3.1)

## 2019-07-21 LAB — FERRITIN: Ferritin: 208 ng/mL (ref 11–307)

## 2019-07-21 MED ORDER — TBO-FILGRASTIM 480 MCG/0.8ML ~~LOC~~ SOSY
480.0000 ug | PREFILLED_SYRINGE | Freq: Once | SUBCUTANEOUS | Status: AC
  Administered 2019-07-21: 480 ug via SUBCUTANEOUS

## 2019-07-21 MED ORDER — DARBEPOETIN ALFA 200 MCG/0.4ML IJ SOSY
PREFILLED_SYRINGE | INTRAMUSCULAR | Status: AC
  Filled 2019-07-21: qty 0.4

## 2019-07-21 MED ORDER — TBO-FILGRASTIM 480 MCG/0.8ML ~~LOC~~ SOSY
PREFILLED_SYRINGE | SUBCUTANEOUS | Status: AC
  Filled 2019-07-21: qty 0.8

## 2019-07-21 MED ORDER — CIPROFLOXACIN HCL 250 MG PO TABS: 250.0000 mg | ORAL_TABLET | Freq: Two times a day (BID) | ORAL | 0 refills | Status: DC

## 2019-07-21 MED ORDER — TBO-FILGRASTIM 300 MCG/0.5ML ~~LOC~~ SOSY
PREFILLED_SYRINGE | SUBCUTANEOUS | Status: AC
  Filled 2019-07-21: qty 0.5

## 2019-07-21 MED ORDER — DARBEPOETIN ALFA 200 MCG/0.4ML IJ SOSY
200.0000 ug | PREFILLED_SYRINGE | Freq: Once | INTRAMUSCULAR | Status: AC
  Administered 2019-07-21: 200 ug via SUBCUTANEOUS

## 2019-07-21 NOTE — Patient Instructions (Signed)
Darbepoetin Alfa injection What is this medicine? DARBEPOETIN ALFA (dar be POE e tin AL fa) helps your body make more red blood cells. It is used to treat anemia caused by chronic kidney failure and chemotherapy. This medicine may be used for other purposes; ask your health care provider or pharmacist if you have questions. COMMON BRAND NAME(S): Aranesp What should I tell my health care provider before I take this medicine? They need to know if you have any of these conditions:  blood clotting disorders or history of blood clots  cancer patient not on chemotherapy  cystic fibrosis  heart disease, such as angina, heart failure, or a history of a heart attack  hemoglobin level of 12 g/dL or greater  high blood pressure  low levels of folate, iron, or vitamin B12  seizures  an unusual or allergic reaction to darbepoetin, erythropoietin, albumin, hamster proteins, latex, other medicines, foods, dyes, or preservatives  pregnant or trying to get pregnant  breast-feeding How should I use this medicine? This medicine is for injection into a vein or under the skin. It is usually given by a health care professional in a hospital or clinic setting. If you get this medicine at home, you will be taught how to prepare and give this medicine. Use exactly as directed. Take your medicine at regular intervals. Do not take your medicine more often than directed. It is important that you put your used needles and syringes in a special sharps container. Do not put them in a trash can. If you do not have a sharps container, call your pharmacist or healthcare provider to get one. A special MedGuide will be given to you by the pharmacist with each prescription and refill. Be sure to read this information carefully each time. Talk to your pediatrician regarding the use of this medicine in children. While this medicine may be used in children as young as 1 month of age for selected conditions, precautions do  apply. Overdosage: If you think you have taken too much of this medicine contact a poison control center or emergency room at once. NOTE: This medicine is only for you. Do not share this medicine with others. What if I miss a dose? If you miss a dose, take it as soon as you can. If it is almost time for your next dose, take only that dose. Do not take double or extra doses. What may interact with this medicine? Do not take this medicine with any of the following medications:  epoetin alfa This list may not describe all possible interactions. Give your health care provider a list of all the medicines, herbs, non-prescription drugs, or dietary supplements you use. Also tell them if you smoke, drink alcohol, or use illegal drugs. Some items may interact with your medicine. What should I watch for while using this medicine? Your condition will be monitored carefully while you are receiving this medicine. You may need blood work done while you are taking this medicine. This medicine may cause a decrease in vitamin B6. You should make sure that you get enough vitamin B6 while you are taking this medicine. Discuss the foods you eat and the vitamins you take with your health care professional. What side effects may I notice from receiving this medicine? Side effects that you should report to your doctor or health care professional as soon as possible:  allergic reactions like skin rash, itching or hives, swelling of the face, lips, or tongue  breathing problems  changes in   vision  chest pain  confusion, trouble speaking or understanding  feeling faint or lightheaded, falls  high blood pressure  muscle aches or pains  pain, swelling, warmth in the leg  rapid weight gain  severe headaches  sudden numbness or weakness of the face, arm or leg  trouble walking, dizziness, loss of balance or coordination  seizures (convulsions)  swelling of the ankles, feet, hands  unusually weak or  tired Side effects that usually do not require medical attention (report to your doctor or health care professional if they continue or are bothersome):  diarrhea  fever, chills (flu-like symptoms)  headaches  nausea, vomiting  redness, stinging, or swelling at site where injected This list may not describe all possible side effects. Call your doctor for medical advice about side effects. You may report side effects to FDA at 1-800-FDA-1088. Where should I keep my medicine? Keep out of the reach of children. Store in a refrigerator between 2 and 8 degrees C (36 and 46 degrees F). Do not freeze. Do not shake. Throw away any unused portion if using a single-dose vial. Throw away any unused medicine after the expiration date. NOTE: This sheet is a summary. It may not cover all possible information. If you have questions about this medicine, talk to your doctor, pharmacist, or health care provider.  2020 Elsevier/Gold Standard (2017-10-10 16:44:20)  Tbo-Filgrastim injection What is this medicine? TBO-FILGRASTIM (T B O fil GRA stim) is a granulocyte colony-stimulating factor that helps you make more neutrophils, a type of white blood cell. Neutrophils are important for fighting infections. Some chemotherapy affects your bone marrow and lowers your neutrophils. This medicine helps decrease the length of time that neutrophils are very low (severe neutropenia). This medicine may be used for other purposes; ask your health care provider or pharmacist if you have questions. COMMON BRAND NAME(S): Granix What should I tell my health care provider before I take this medicine? They need to know if you have any of these conditions:  bone scan or tests planned  kidney disease  sickle cell anemia  an unusual or allergic reaction to tbo-filgrastim, filgrastim, pegfilgrastim, other medicines, foods, dyes, or preservatives  pregnant or trying to get pregnant  breast-feeding How should I use this  medicine? This medicine is for injection under the skin. If you get this medicine at home, you will be taught how to prepare and give this medicine. Refer to the Instructions for Use that come with your medication packaging. Use exactly as directed. Take your medicine at regular intervals. Do not take your medicine more often than directed. It is important that you put your used needles and syringes in a special sharps container. Do not put them in a trash can. If you do not have a sharps container, call your pharmacist or healthcare provider to get one. Talk to your pediatrician regarding the use of this medicine in children. While this drug may be prescribed for children as young as 44 month of age for selected conditions, precautions do apply. Overdosage: If you think you have taken too much of this medicine contact a poison control center or emergency room at once. NOTE: This medicine is only for you. Do not share this medicine with others. What if I miss a dose? It is important not to miss your dose. Call your doctor or health care professional if you miss a dose. What may interact with this medicine? This medicine may interact with the following medications:  medicines that may cause  a release of neutrophils, such as lithium This list may not describe all possible interactions. Give your health care provider a list of all the medicines, herbs, non-prescription drugs, or dietary supplements you use. Also tell them if you smoke, drink alcohol, or use illegal drugs. Some items may interact with your medicine. What should I watch for while using this medicine? You may need blood work done while you are taking this medicine. What side effects may I notice from receiving this medicine? Side effects that you should report to your doctor or health care professional as soon as possible:  allergic reactions like skin rash, itching or hives, swelling of the face, lips, or tongue  back pain  blood in  the urine  dark urine  dizziness  fast heartbeat  feeling faint  shortness of breath or breathing problems  signs and symptoms of infection like fever or chills; cough; or sore throat  signs and symptoms of kidney injury like trouble passing urine or change in the amount of urine  stomach or side pain, or pain at the shoulder  sweating  swelling of the legs, ankles, or abdomen  tiredness Side effects that usually do not require medical attention (report to your doctor or health care professional if they continue or are bothersome):  bone pain  diarrhea  headache  muscle pain  vomiting This list may not describe all possible side effects. Call your doctor for medical advice about side effects. You may report side effects to FDA at 1-800-FDA-1088. Where should I keep my medicine? Keep out of the reach of children. Store in a refrigerator between 2 and 8 degrees C (36 and 46 degrees F). Keep in carton to protect from light. Throw away this medicine if it is left out of the refrigerator for more than 5 consecutive days. Throw away any unused medicine after the expiration date. NOTE: This sheet is a summary. It may not cover all possible information. If you have questions about this medicine, talk to your doctor, pharmacist, or health care provider.  2020 Elsevier/Gold Standard (2018-07-27 19:58:39)

## 2019-07-22 ENCOUNTER — Telehealth: Payer: Self-pay | Admitting: Oncology

## 2019-07-22 NOTE — Telephone Encounter (Signed)
No los per 10/12. °

## 2019-07-23 ENCOUNTER — Telehealth (HOSPITAL_COMMUNITY): Payer: Self-pay

## 2019-07-23 NOTE — Telephone Encounter (Signed)
Pt returned call and left message on triage line regarding lab results. Called patient, advised of improved renal function on blood work from 10/9.  However advised that blood sugar consistently elevated. Pt endorses that she is chronically on prednisone.  Advised that she speak with her prescribing doctor regarding management of her blood sugar. Verbalized understanding.

## 2019-07-24 ENCOUNTER — Telehealth: Payer: Self-pay | Admitting: *Deleted

## 2019-07-24 NOTE — Telephone Encounter (Signed)
Received call from pt stating she is exeriencing nausea and GI upset with the Cipro antibiotic.   Pt states she is not taking the antibiotic on a full stomach.  RN educated pt to take antibiotic after dinner tonight and after breakfast tomorrow morning to see if this helps with her nausea and GI upset.  If it does not, pt educated to call the office.  Pt verbalized understanding.

## 2019-07-25 ENCOUNTER — Other Ambulatory Visit: Payer: Self-pay | Admitting: Pharmacist

## 2019-07-25 NOTE — Patient Outreach (Signed)
[name]) Care Management  07/25/2019  [name] 04/20/1941 [id number]   Patient was called for follow up. HIPAA identifiers were obtained.   Patient is an 78 year old female with multiple medical conditions including but not limited IO:XBDZHG, thrombocytopenia, hypertension, hyperlipidemia, type 2 diabetes, CAD status post CABG, vasculitis, low extremity edema, anxiety, CKD stage III, GERD, A Fib, and PAD.   Patient visited the ED a few weeks ago for back pain. She reported her back pain has improved and she uses Tramadol PRN if needed for back pain.  Patient reported her throat was sore this morning. She was recently started on Ciprofloxacin but said that has not helped.  From chart review patient's white blood cell count was low at her 07/21/2019 visit and her rituximab treatment was held. WBC- 0.8 (4-10.5).  Since her white blood cell count was low, patient was encouraged to call her provider and get some guidance and triage. Patient said she was not coughing or having any phlegm. She also denied fever, sweating, vision changes or a headache.  Medications were reviewed via telephone:  Medications Reviewed Today    Reviewed by [name], [facility] (Pharmacist) on 07/25/19 at [place] List Status: <None>  Medication Order Taking? Sig Documenting Provider Last Dose Status Informant  acetaminophen (TYLENOL) 325 MG tablet [id number] Yes Take 650 mg by mouth every 6 (six) hours as needed for mild pain.  [provider] Taking Active Self  allopurinol (ZYLOPRIM) 100 MG tablet [id number] Yes Take 100 mg by mouth daily. [provider] Taking Active Self  ALPRAZolam (XANAX) 0.5 MG tablet [id number] Yes Take 0.25 mg by mouth at bedtime.  [provider] Taking Active Self           Med Note Gloriann Loan, Ellard Artis   Fri Jun 20, 2019  9:53 AM) "Dr Aundra Dubin increased it to a whole tablet 0.50 mg to see if it will help me sleep".  atorvastatin (LIPITOR) 80 MG  tablet [id number] Yes Take 1 tablet (80 mg total) by mouth daily at 6 PM. [name], MD Taking Active Self  Cholecalciferol (VITAMIN D3) 1000 UNITS CAPS [id number] Yes Take 2,000 Units by mouth daily with lunch.  [provider] Taking Active Self           Med Note [name]   Fri Jun 27, 2019 12:31 PM)    ciprofloxacin (CIPRO) 250 MG tablet [id number] Yes Take 1 tablet (250 mg total) by mouth 2 (two) times daily. Magrinat, [name], MD Taking Active   cloNIDine (CATAPRES) 0.2 MG tablet [id number] Yes Take 1 tablet (0.2 mg total) by mouth 2 (two) times daily. [name], MD Taking Active   Cyanocobalamin (VITAMIN B12) 1000 MCG TBCR [id number] Yes Take 1,000 mcg by mouth daily with lunch.  [provider] Taking Active Self  feeding supplement, [place], ([place]) LIQD [id number] Yes Take 237 mLs by mouth 2 (two) times daily between meals. [name], MD Taking Active Self           Med Note Gloriann Loan, Ellard Artis   Fri Jun 20, 2019  9:51 AM) Drinks Boost , not GLucerna  furosemide (LASIX) 40 MG tablet [id number] Yes Take 2 tablets (80 mg total) by mouth every morning AND 1 tablet (40 mg total) every evening. [name], MD Taking Active   hydrALAZINE (APRESOLINE) 100 MG tablet [id number] Yes Take 1 tablet (100 mg total) by mouth 3 (three) times daily.  [name], MD Taking Active Self  Magnesium 500 MG TABS [id number] Yes Take 500 mg by mouth daily. [provider] Taking Active Self  magnesium oxide (MAG-OX) 400 (241.3 Mg) MG tablet [id number] Yes Take 1 tablet (400 mg total) by mouth daily. [name], MD Taking Active Self           Med Note [name] Jul 07, 2019  3:42 AM)    metFORMIN (GLUCOPHAGE) 500 MG tablet [id number] Yes Take 500 mg by mouth 2 (two) times daily with a meal.  [provider] Taking Active Self  metoprolol tartrate (LOPRESSOR) 25 MG tablet [id number] Yes Take 0.5 tablets (12.5 mg total) by  mouth 2 (two) times daily. [place] Donnamarie Poag, DO Taking Active Self  ondansetron (ZOFRAN ODT) 4 MG disintegrating tablet [id number] Yes Take 1 tablet (4 mg total) by mouth every 8 (eight) hours as needed for nausea or vomiting. [name], [place] E, PA-C Taking Active Self           Med Note [name] Jul 07, 2019  3:49 AM)    pantoprazole (PROTONIX) 40 MG tablet [id number] Yes Take 40 mg by mouth 2 (two) times daily.  [provider] Taking Active Self           Med Note [name]   Wed Jun 20, 2017 10:33 AM)    predniSONE (DELTASONE) 5 MG tablet [id number] Yes Take 3 tablets (15 mg total) by mouth daily with breakfast. Magrinat, [name], MD Taking Active   riTUXimab [phone] (RITUXAN) 10 MG/ML injection [id number] Yes Inject into the vein every Tuesday.  [provider] Taking Active Self           Med Note [name] Jul 07, 2019  3:49 AM) Pt gets at the cancer center  spironolactone (ALDACTONE) 25 MG tablet [id number] Yes Take 1 tablet (25 mg total) by mouth daily. [name], MD Taking Active   tadalafil, PAH, (ADCIRCA) 20 MG tablet [id number] Yes Take 2 tablets (40 mg total) by mouth daily. [name], MD Taking Active Self  traMADol [name]) 50 MG tablet [id number] Yes Take 1 tablet (50 mg total) by mouth every 6 (six) hours as needed. Magrinat, [name], MD Taking Active   vitamin C (ASCORBIC ACID) 500 MG tablet [id number] Yes Take 500 mg by mouth daily with lunch. [provider] Taking Active Self  warfarin (COUMADIN) 5 MG tablet [id number] Yes Take 5-7.5 mg by mouth daily. Take 1.5 tablets (7.5 mg) on Tues, Thursday, Saturday and Sunday. Take 1 tablet (5mg ) on Mondays, Wednesdays, and Fridays. [provider] Taking Active Self         Plan: Follow up with patient in 1 week on sore throat.  [name], PharmD, [place] Clinical Pharmacist [phone]

## 2019-07-28 ENCOUNTER — Other Ambulatory Visit: Payer: Self-pay

## 2019-07-28 ENCOUNTER — Inpatient Hospital Stay: Payer: Medicare HMO

## 2019-07-28 ENCOUNTER — Other Ambulatory Visit: Payer: Medicare HMO

## 2019-07-28 ENCOUNTER — Telehealth: Payer: Self-pay | Admitting: Medical

## 2019-07-28 ENCOUNTER — Telehealth: Payer: Self-pay | Admitting: *Deleted

## 2019-07-28 ENCOUNTER — Inpatient Hospital Stay (HOSPITAL_BASED_OUTPATIENT_CLINIC_OR_DEPARTMENT_OTHER): Payer: Medicare HMO | Admitting: Adult Health

## 2019-07-28 ENCOUNTER — Telehealth: Payer: Self-pay

## 2019-07-28 ENCOUNTER — Ambulatory Visit: Payer: Medicare HMO | Admitting: Podiatry

## 2019-07-28 ENCOUNTER — Encounter: Payer: Self-pay | Admitting: Adult Health

## 2019-07-28 VITALS — BP 156/55 | HR 62 | Temp 98.7°F | Resp 20 | Ht 62.0 in | Wt 125.3 lb

## 2019-07-28 DIAGNOSIS — M8580 Other specified disorders of bone density and structure, unspecified site: Secondary | ICD-10-CM | POA: Diagnosis not present

## 2019-07-28 DIAGNOSIS — B370 Candidal stomatitis: Secondary | ICD-10-CM | POA: Diagnosis not present

## 2019-07-28 DIAGNOSIS — D696 Thrombocytopenia, unspecified: Secondary | ICD-10-CM

## 2019-07-28 DIAGNOSIS — Z7984 Long term (current) use of oral hypoglycemic drugs: Secondary | ICD-10-CM | POA: Diagnosis not present

## 2019-07-28 DIAGNOSIS — D631 Anemia in chronic kidney disease: Secondary | ICD-10-CM | POA: Diagnosis not present

## 2019-07-28 DIAGNOSIS — D701 Agranulocytosis secondary to cancer chemotherapy: Secondary | ICD-10-CM | POA: Diagnosis not present

## 2019-07-28 DIAGNOSIS — D500 Iron deficiency anemia secondary to blood loss (chronic): Secondary | ICD-10-CM

## 2019-07-28 DIAGNOSIS — B3781 Candidal esophagitis: Secondary | ICD-10-CM

## 2019-07-28 DIAGNOSIS — N183 Chronic kidney disease, stage 3 unspecified: Secondary | ICD-10-CM | POA: Diagnosis not present

## 2019-07-28 DIAGNOSIS — I776 Arteritis, unspecified: Secondary | ICD-10-CM

## 2019-07-28 DIAGNOSIS — I739 Peripheral vascular disease, unspecified: Secondary | ICD-10-CM

## 2019-07-28 DIAGNOSIS — I7782 Antineutrophilic cytoplasmic antibody (ANCA) vasculitis: Secondary | ICD-10-CM

## 2019-07-28 DIAGNOSIS — D594 Other nonautoimmune hemolytic anemias: Secondary | ICD-10-CM

## 2019-07-28 DIAGNOSIS — Z5112 Encounter for antineoplastic immunotherapy: Secondary | ICD-10-CM | POA: Diagnosis not present

## 2019-07-28 DIAGNOSIS — Z7901 Long term (current) use of anticoagulants: Secondary | ICD-10-CM | POA: Diagnosis not present

## 2019-07-28 DIAGNOSIS — D508 Other iron deficiency anemias: Secondary | ICD-10-CM

## 2019-07-28 LAB — RETICULOCYTES
Immature Retic Fract: 23.6 % — ABNORMAL HIGH (ref 2.3–15.9)
RBC.: 3.51 MIL/uL — ABNORMAL LOW (ref 3.87–5.11)
Retic Count, Absolute: 93.4 10*3/uL (ref 19.0–186.0)
Retic Ct Pct: 2.7 % (ref 0.4–3.1)

## 2019-07-28 LAB — CBC WITH DIFFERENTIAL/PLATELET
Abs Immature Granulocytes: 0.07 10*3/uL (ref 0.00–0.07)
Basophils Absolute: 0 10*3/uL (ref 0.0–0.1)
Basophils Relative: 1 %
Eosinophils Absolute: 0 10*3/uL (ref 0.0–0.5)
Eosinophils Relative: 2 %
HCT: 30.9 % — ABNORMAL LOW (ref 36.0–46.0)
Hemoglobin: 10.1 g/dL — ABNORMAL LOW (ref 12.0–15.0)
Immature Granulocytes: 6 %
Lymphocytes Relative: 26 %
Lymphs Abs: 0.3 10*3/uL — ABNORMAL LOW (ref 0.7–4.0)
MCH: 28.9 pg (ref 26.0–34.0)
MCHC: 32.7 g/dL (ref 30.0–36.0)
MCV: 88.5 fL (ref 80.0–100.0)
Monocytes Absolute: 0.4 10*3/uL (ref 0.1–1.0)
Monocytes Relative: 33 %
Neutro Abs: 0.4 10*3/uL — CL (ref 1.7–7.7)
Neutrophils Relative %: 32 %
Platelets: 180 10*3/uL (ref 150–400)
RBC: 3.49 MIL/uL — ABNORMAL LOW (ref 3.87–5.11)
RDW: 15.9 % — ABNORMAL HIGH (ref 11.5–15.5)
WBC: 1.2 10*3/uL — ABNORMAL LOW (ref 4.0–10.5)
nRBC: 0 % (ref 0.0–0.2)

## 2019-07-28 LAB — CMP (CANCER CENTER ONLY)
ALT: 12 U/L (ref 0–44)
AST: 18 U/L (ref 15–41)
Albumin: 2.8 g/dL — ABNORMAL LOW (ref 3.5–5.0)
Alkaline Phosphatase: 89 U/L (ref 38–126)
Anion gap: 12 (ref 5–15)
BUN: 63 mg/dL — ABNORMAL HIGH (ref 8–23)
CO2: 20 mmol/L — ABNORMAL LOW (ref 22–32)
Calcium: 9.2 mg/dL (ref 8.9–10.3)
Chloride: 99 mmol/L (ref 98–111)
Creatinine: 1.64 mg/dL — ABNORMAL HIGH (ref 0.44–1.00)
GFR, Est AFR Am: 34 mL/min — ABNORMAL LOW (ref >60)
GFR, Estimated: 29 mL/min — ABNORMAL LOW (ref >60)
Glucose, Bld: 281 mg/dL — ABNORMAL HIGH (ref 70–99)
Potassium: 3.9 mmol/L (ref 3.5–5.1)
Sodium: 131 mmol/L — ABNORMAL LOW (ref 135–145)
Total Bilirubin: 0.4 mg/dL (ref 0.3–1.2)
Total Protein: 5.6 g/dL — ABNORMAL LOW (ref 6.5–8.1)

## 2019-07-28 LAB — IRON AND TIBC
Iron: 20 ug/dL — ABNORMAL LOW (ref 41–142)
Saturation Ratios: 8 % — ABNORMAL LOW (ref 21–57)
TIBC: 244 ug/dL (ref 236–444)
UIBC: 224 ug/dL (ref 120–384)

## 2019-07-28 LAB — SAVE SMEAR(SSMR), FOR PROVIDER SLIDE REVIEW

## 2019-07-28 LAB — FERRITIN: Ferritin: 167 ng/mL (ref 11–307)

## 2019-07-28 MED ORDER — FILGRASTIM-SNDZ 480 MCG/0.8ML IJ SOSY
480.0000 ug | PREFILLED_SYRINGE | Freq: Once | INTRAMUSCULAR | Status: AC
  Administered 2019-07-28: 480 ug via SUBCUTANEOUS
  Filled 2019-07-28: qty 0.8

## 2019-07-28 MED ORDER — FLUCONAZOLE 200 MG PO TABS: 200.0000 mg | ORAL_TABLET | Freq: Every day | ORAL | 2 refills | Status: DC

## 2019-07-28 NOTE — Telephone Encounter (Signed)
Call above number. Left message for nurse to call back to discuss options.

## 2019-07-28 NOTE — Patient Instructions (Signed)

## 2019-07-28 NOTE — Progress Notes (Signed)
Tried to reach pt by phone multiple times today regarding a referral for counseling services. Was not able to reach pt via home phone or mobile phone and was not able to leave a VM. I will attempt to reach the patient again on 07/30/2019.  Art Buff [place] Counseling Intern Voicemail:  [phone]

## 2019-07-28 NOTE — Telephone Encounter (Signed)
Call made to coumadin clinic in regards to fluconazole for patient. Levada Dy in pharmacy said it is ok for patient to take and that they will contact patient to make adjustments on her coumadin dosage accordingly.

## 2019-07-28 NOTE — Telephone Encounter (Signed)
[name], from the [place] at [facility] can be reached at [phone]. She wants to know if it's ok for patient to take fluconazole.

## 2019-07-28 NOTE — Telephone Encounter (Signed)
CRITICAL VALUE STICKER  CRITICAL VALUE: ANC = 0.4.  RECEIVER (on-site recipient of call): Reona Zendejas [place] RN, Triage [facility].   DATE & TIME NOTIFIED: 07/28/2019 at 0934.   MESSENGER (representative from lab): Rosann Auerbach MT [facility].  MD NOTIFIED: Collaborative with Wilber Bihari NP.  TIME OF NOTIFICATION: 07/28/2019 at [phone].  RESPONSE: None. Provider F/U scheduled today at 0930

## 2019-07-28 NOTE — Telephone Encounter (Signed)
Trying to call patient to adjust warfarin dose while on fluconazole.  Unable to leave message. Will try again later.

## 2019-07-28 NOTE — Progress Notes (Signed)
[place]  [phone] [phone]    ID: [name] DOB: 1941/08/04  MR#: [id number]  EXH#:[id number]  Patient Care Team: [name], MD as PCP - General (Internal Medicine) [name], MD as PCP - Advanced Heart Failure (Cardiology) Lorretta Harp, MD as PCP - Cardiology (Cardiology) Magrinat, [name], MD as Consulting Physician (Hematology and Oncology) Arta Silence, MD as Consulting Physician (Gastroenterology) [name], MD as Consulting Physician (Cardiology) Marzetta Board, DPM as Consulting Physician (Podiatry) Gavin Pound, MD as Consulting Physician (Rheumatology) [name], [facility] as [place] Management (Pharmacist) Jon Billings, RN as [place] Management OTHER MD:   CHIEF COMPLAINT: anemia and thrombocytopenia  CURRENT TREATMENT: [Feraheme], Aranesp, prednisone, [rituximab]   INTERVAL HISTORY: [name] returns today for follow-up and treatment of her anemia and thrombocytopenia.   She continues on aranesp, currently at 200 mcg weekly. She is due for a dose today.  Her hemoglobin today is 10.1.  She also continues on rituximab. Today would be day 1 cycle 7.  Her Rituximab was held last week due to neutropenia.  She received one granix injection on 10/12.  Today, her ANC is 0.4.  She completed her antibiotics.  She tolerated them well.     REVIEW OF SYSTEMS: [name] tells me that she is feeling "ok" today.  She is happy that her hemoglobin has improved, however she feels slightly depressed.  She is fatigued, and has some muscle fatigue in her legs, and gets sad and irritated when she can't do things that she wants to do.  She has no suicidal ideations, but does get sad about her increased inability to do things that she wants to do.  She has had depression previously, when her daughter passed away, and was only able to tolerate Wellbutrin.  She isn't sure  if she wants to take any medication for it or not.    [name] notes her legs are weak.  She says that she also feels like she has a fungus in her mouth along with mild dysphagia when swallowing.  She otherwise is feeling ok.  She has no fevers or chills.  She is without cough, shortness of breath, chest pain, or palpitations.  She has no bowel/bladder changes.  Her legs remain swollen and she is taking meds as per Dr. [name] in cardiology.  She has no nausea, vomiting.  A detailed ROS was otherwise non contributory.     HISTORY OF CURRENT ILLNESS: From the original consult note:  AANCHAL COPE is an 78 y.o. female from [place], [place].  She has a past medical history significant forPAD s/p stenting; moderate to severe pulmonary HTN; afib and AVR on Coumadin; HTN; HLD; DM; and CAD s/p CABG. the patient was admitted to the hospital for anemia.  She was having increasing fatigue and shortness of breath and had routine lab work drawn which showed a hemoglobin of 6.6.  Due to her abnormal lab work, she was advised to come to the emergency room for admission.  She was also admitted in February 2020 for symptomatic anemia.  She reports she has been anemic for at least a year and has been taking oral iron.  She underwent a colonoscopy and EGD on 01/22/2018.  The colonoscopy showed diverticulosis in the sigmoid colon and descending colon, internal hemorrhoids.  The upper endoscopy was normal except for erythematous mucosa in the antrum.  She had a repeat EGD on 11/21/2018  that showed a few minimally oozing( with scope trauma) superficial gastric ulcers with pigmented material were found in the gastric body. The largest lesion was 4 mm in largest dimension.  She underwent another upper endoscopy on 02/27/2019 which showed patchy mildly friable mucosa with contact bleeding was found in the gastric fundus, in the gastric body and in the gastric antrum.  The patient is maintained on warfarin due to A. fib and a  mechanical aortic valve.  Review of the patient's chart shows that she has been anemic dating back to at least December thousand 12 (these are the earliest labs available to me).  Her highest hemoglobin was 11.6 in December 2012.  [name] also note that the patient has had intermittent mild leukopenia adding back to at least April 2019.  Her lowest white blood cell count was 2.6 in April 2019.  Since admission, the patient has received 1 unit of packed red blood cells.  She is currently on heparin for her procedure with plan to bridge back to Coumadin.  Stool for occult blood was negative x1 on 02/24/2019.  Hematology was asked see the patient to make recommendations regarding her anemia.  [name] subsequent history is as detailed below.   PAST MEDICAL HISTORY: Past Medical History:  Diagnosis Date   Aortic atherosclerosis ([place]) 01/23/2018   Atrial fibrillation ([facility])    Carotid artery disease ([place])    Cholelithiasis 01/23/2018   Chronic anticoagulation    Coronary artery disease    status post coronary artery bypass grafting times 07/10/2004   Diabetes mellitus    Hypercholesteremia    Hypertension    Mechanical heart valve present    H. aortic valve replacement at the time of bypass surgery October 2005   Moderate to severe pulmonary hypertension ([place])    Peripheral arterial disease ([place])    history of left common iliac artery PTA and stenting for a chronic total occlusion 08/26/01     PAST SURGICAL HISTORY: Past Surgical History:  Procedure Laterality Date   AORTIC VALVE REPLACEMENT     CARDIAC CATHETERIZATION  11/10/2004   40% right common illiac, 70% in stent restenosis of distal left common illiac,    CARDIAC CATHETERIZATION  05/18/2004   LAD 50-70% midstenosis, RCA dominant w/50% stenosis, 50% Right common Illiac artery ostial stenosis, 90% in stent restenosis within midportion of left common illiac stent   Carotid Duplex  03/12/2012   RSA-elev. velocities  suggestive of a 50-69% diameter reduction, Right&Left Bulb/Prox ICA-mild-mod.fibrous plaqueelevating Velocities abnormal study.   CHOLECYSTECTOMY N/A 03/01/2018   Procedure: LAPAROSCOPIC CHOLECYSTECTOMY;  Surgeon: Judeth Horn, MD;  Location: [place];  Service: General;  Laterality: N/A;   COLONOSCOPY WITH PROPOFOL N/A 01/22/2018   Procedure: COLONOSCOPY WITH PROPOFOL;  Surgeon: Wilford Corner, MD;  Location: [place];  Service: Endoscopy;  Laterality: N/A;   ESOPHAGOGASTRODUODENOSCOPY (EGD) WITH PROPOFOL N/A 01/22/2018   Procedure: ESOPHAGOGASTRODUODENOSCOPY (EGD) WITH PROPOFOL;  Surgeon: Wilford Corner, MD;  Location: [facility];  Service: Endoscopy;  Laterality: N/A;   ESOPHAGOGASTRODUODENOSCOPY (EGD) WITH PROPOFOL N/A 11/21/2018   Procedure: ESOPHAGOGASTRODUODENOSCOPY (EGD) WITH PROPOFOL;  Surgeon: [name], MD;  Location: [place];  Service: Gastroenterology;  Laterality: N/A;   ESOPHAGOGASTRODUODENOSCOPY (EGD) WITH PROPOFOL Left 02/27/2019   Procedure: ESOPHAGOGASTRODUODENOSCOPY (EGD) WITH PROPOFOL;  Surgeon: Arta Silence, MD;  Location: [facility] END[facility]COPY;  Service: Endoscopy;  Laterality: Left;   GIVENS CAPSULE STUDY N/A 11/21/2018   Procedure: GIVENS CAPSULE STUDY;  Surgeon: [name], MD;  Location: [place];  Service: Gastroenterology;  Laterality: N/A;  To be deployed during EGD   Lower Ext. Duplex  03/12/2012   Right Proximal CIA- vessel narrowing w/elevated velocities 0-49% diameter reduction. Right SFA-mild mixed density plaque throughout vessel.   NM MYOCAR PERF WALL MOTION  05/19/2010   protocol: Persantine, post stress EF 65%, negative for ischemia, low risk scan   RIGHT HEART CATH N/A 06/27/2018   Procedure: RIGHT HEART CATH;  Surgeon: [name], MD;  Location: [facility];  Service: Cardiovascular;  Laterality: N/A;   TRANSTHORACIC ECHOCARDIOGRAM  08/29/2012   Moderately calcified annulus of mitral valve, moderate regurg. of both mitral  valve and tricuspid valve.     FAMILY HISTORY: Family History  Problem Relation Age of Onset   Breast cancer Neg Hx     GYNECOLOGIC HISTORY:  No LMP recorded. Patient has had a hysterectomy. Menarche:  years old Age at first live birth:  [place] P: 3 LMP:  Contraceptive:  HRT:   Hysterectomy?: yes BSO?:    SOCIAL HISTORY: (Current as of 03/07/2019) Lorely is widowed.  She lives by herself.  She has 3 children that live locally.  She smoked three quarters of a pack of cigarettes daily for 50 years.  She quit in 2005.  Denies alcohol use.   ADVANCED DIRECTIVES: Not in place    HEALTH MAINTENANCE: Social History   Tobacco Use   Smoking status: Former Smoker    Packs/day: 1.00    Years: 30.00    Pack years: 30.00    Types: Cigarettes   Smokeless tobacco: Never Used   Tobacco comment: quit smoking 2005  Substance Use Topics   Alcohol use: No    Alcohol/week: 0.0 standard drinks   Drug use: No    Colonoscopy: EGD on 02/27/2019 under Dr. Paulita Fujita found some friable gastric mucosa.  Colonoscopy under Dr. Michail Sermon 01/22/2018 showed significant diverticular disease  PAP:   Bone density:  Mammography: 02/25/2017, breast density category B.  Allergies  Allergen Reactions   Flagyl [Metronidazole] Rash    ALL-OVER BODY RASH   Coreg [Carvedilol] Other (See Comments)    Terrible cramping in the feet and had a lot of bowel movements, but not diarrhea   Losartan Swelling    Patient doesn't recall site of swelling   Verapamil Hives   Zetia [Ezetimibe] Other (See Comments)    Reaction not recalled   Zocor [Simvastatin - High Dose] Other (See Comments)    Reaction not recalled    Current Outpatient Medications  Medication Sig Dispense Refill   acetaminophen (TYLENOL) 325 MG tablet Take 650 mg by mouth every 6 (six) hours as needed for mild pain.      allopurinol (ZYLOPRIM) 100 MG tablet Take 100 mg by mouth daily.     ALPRAZolam (XANAX) 0.5 MG tablet Take 0.25 mg  by mouth at bedtime.      atorvastatin (LIPITOR) 80 MG tablet Take 1 tablet (80 mg total) by mouth daily at 6 PM.     Cholecalciferol (VITAMIN D3) 1000 UNITS CAPS Take 2,000 Units by mouth daily with lunch.      ciprofloxacin (CIPRO) 250 MG tablet Take 1 tablet (250 mg total) by mouth 2 (two) times daily. 12 tablet 0   cloNIDine (CATAPRES) 0.2 MG tablet Take 1 tablet (0.2 mg total) by mouth 2 (two) times daily. 60 tablet 3   Cyanocobalamin (VITAMIN B12) 1000 MCG TBCR Take 1,000 mcg by mouth daily with lunch.      feeding supplement, [place], ([place]  SHAKE) LIQD Take 237 mLs by mouth 2 (two) times daily between meals. 237 mL 30   furosemide (LASIX) 40 MG tablet Take 2 tablets (80 mg total) by mouth every morning AND 1 tablet (40 mg total) every evening. 90 tablet 3   hydrALAZINE (APRESOLINE) 100 MG tablet Take 1 tablet (100 mg total) by mouth 3 (three) times daily. 270 tablet 1   Magnesium 500 MG TABS Take 500 mg by mouth daily.     magnesium oxide (MAG-OX) 400 (241.3 Mg) MG tablet Take 1 tablet (400 mg total) by mouth daily.     metFORMIN (GLUCOPHAGE) 500 MG tablet Take 500 mg by mouth 2 (two) times daily with a meal.      metoprolol tartrate (LOPRESSOR) 25 MG tablet Take 0.5 tablets (12.5 mg total) by mouth 2 (two) times daily. 60 tablet 0   ondansetron (ZOFRAN ODT) 4 MG disintegrating tablet Take 1 tablet (4 mg total) by mouth every 8 (eight) hours as needed for nausea or vomiting. 6 tablet 0   pantoprazole (PROTONIX) 40 MG tablet Take 40 mg by mouth 2 (two) times daily.      predniSONE (DELTASONE) 5 MG tablet Take 3 tablets (15 mg total) by mouth daily with breakfast. 90 tablet 2   riTUXimab R5188 (RITUXAN) 10 MG/ML injection Inject into the vein every Tuesday.      spironolactone (ALDACTONE) 25 MG tablet Take 1 tablet (25 mg total) by mouth daily. 90 tablet 3   tadalafil, PAH, (ADCIRCA) 20 MG tablet Take 2 tablets (40 mg total) by mouth daily. 60 tablet 6   traMADol  (ULTRAM) 50 MG tablet Take 1 tablet (50 mg total) by mouth every 6 (six) hours as needed. 15 tablet 0   vitamin C (ASCORBIC ACID) 500 MG tablet Take 500 mg by mouth daily with lunch.     warfarin (COUMADIN) 5 MG tablet Take 5-7.5 mg by mouth daily. Take 1.5 tablets (7.5 mg) on Tues, Thursday, Saturday and Sunday. Take 1 tablet (5mg ) on Mondays, Wednesdays, and Fridays.     fluconazole (DIFLUCAN) 200 MG tablet Take 1 tablet (200 mg total) by mouth daily. 5 tablet 2   Current Facility-Administered Medications  Medication Dose Route Frequency Provider Last Rate Last Dose   filgrastim-sndz (ZARXIO) injection 480 mcg  480 mcg Subcutaneous Once Gardenia Phlegm, NP         OBJECTIVE:   Vitals:   07/28/19 0917  BP: (!) 156/55  Pulse: 62  Resp: 20  Temp: 98.7 F (37.1 C)  SpO2: 98%   Wt Readings from Last 3 Encounters:  07/28/19 125 lb 4.8 oz (56.8 kg)  07/21/19 126 lb 8 oz (57.4 kg)  07/18/19 125 lb 6.4 oz (56.9 kg)   Body mass index is 22.92 kg/m.    ECOG FS:2 - Symptomatic, <50% confined to bed GENERAL: Patient is a an older, tired appearing woman, in no acute distress, examined in wheelchair. HEENT:  Sclerae anicteric.  + thrush on tongue, upper palate, and posterior pharynx. Neck is supple.  NODES:  No cervical, supraclavicular, or axillary lymphadenopathy palpated.  LUNGS:  Clear to auscultation bilaterally.  No wheezes or rhonchi. HEART:  Regular rate and rhythm. No murmur appreciated. ABDOMEN:  Soft, nontender.  Positive, normoactive bowel sounds. No organomegaly palpated. MSK:  No focal spinal tenderness to palpation. Decreased musculature in legs EXTREMITIES:  +2 bilateral pedal edema  SKIN:  Clear with no obvious rashes or skin changes. No nail dyscrasia. NEURO:  Nonfocal. Well oriented.  Appropriate affect.    LAB RESULTS:  CMP     Component Value Date/Time   NA 131 (L) 07/28/2019 0845   NA 137 12/06/2017 1436   K 3.9 07/28/2019 0845   CL 99  07/28/2019 0845   CO2 20 (L) 07/28/2019 0845   GLUCOSE 281 (H) 07/28/2019 0845   BUN 63 (H) 07/28/2019 0845   BUN 20 12/06/2017 1436   CREATININE 1.64 (H) 07/28/2019 0845   CALCIUM 9.2 07/28/2019 0845   PROT 5.6 (L) 07/28/2019 0845   ALBUMIN 2.8 (L) 07/28/2019 0845   AST 18 07/28/2019 0845   ALT 12 07/28/2019 0845   ALKPHOS 89 07/28/2019 0845   BILITOT 0.4 07/28/2019 0845   GFRNONAA 29 (L) 07/28/2019 0845   GFRAA 34 (L) 07/28/2019 0845    Lab Results  Component Value Date   TOTALPROTELP 6.9 05/07/2019    No results found for: KPAFRELGTCHN, LAMBDASER, KAPLAMBRATIO  Lab Results  Component Value Date   WBC 1.2 (L) 07/28/2019   NEUTROABS 0.4 (LL) 07/28/2019   HGB 10.1 (L) 07/28/2019   HCT 30.9 (L) 07/28/2019   MCV 88.5 07/28/2019   PLT 180 07/28/2019    @LASTCHEMISTRY @  No results found for: LABCA2  No components found for: [id number]  No results for input(s): INR in the last 168 hours.  No results found for: LABCA2  No results found for: JIR678  No results found for: LFY101  No results found for: BPZ025  No results found for: CA2729  No components found for: HGQUANT  No results found for: CEA1 / No results found for: CEA1   No results found for: AFPTUMOR  No results found for: CHROMOGRNA  No results found for: PSA1  Appointment on 07/28/2019  Component Date Value Ref Range Status   Iron 07/28/2019 20* 41 - 142 ug/dL Final   TIBC 07/28/2019 244  236 - 444 ug/dL Final   Saturation Ratios 07/28/2019 8* 21 - 57 % Final   UIBC 07/28/2019 224  120 - 384 ug/dL Final   Performed at [facility], [place] [address], [place], [place] [address]   Sodium 07/28/2019 131* 135 - 145 mmol/L Final   Potassium 07/28/2019 3.9  3.5 - 5.1 mmol/L Final   Chloride 07/28/2019 99  98 - 111 mmol/L Final   CO2 07/28/2019 20* 22 - 32 mmol/L Final   Glucose, Bld 07/28/2019 281* 70 - 99 mg/dL Final   BUN 07/28/2019 63* 8 - 23 mg/dL Final    Creatinine 07/28/2019 1.64* 0.44 - 1.00 mg/dL Final   Calcium 07/28/2019 9.2  8.9 - 10.3 mg/dL Final   Total Protein 07/28/2019 5.6* 6.5 - 8.1 g/dL Final   Albumin 07/28/2019 2.8* 3.5 - 5.0 g/dL Final   AST 07/28/2019 18  15 - 41 U/L Final   ALT 07/28/2019 12  0 - 44 U/L Final   Alkaline Phosphatase 07/28/2019 89  38 - 126 U/L Final   Total Bilirubin 07/28/2019 0.4  0.3 - 1.2 mg/dL Final   GFR, Est Non Af Am 07/28/2019 29* >60 mL/min Final   GFR, Est AFR Am 07/28/2019 34* >60 mL/min Final   Anion gap 07/28/2019 12  5 - 15 Final   Performed at [facility], [place] [address], [place], [place] [address]   Smear Review 07/28/2019 SMEAR STAINED AND AVAILABLE FOR REVIEW   Final   Performed at [facility], [address], [place], [place] [address]   Retic Ct Pct  07/28/2019 2.7  0.4 - 3.1 % Final   RBC. 07/28/2019 3.51* 3.87 - 5.11 MIL/uL Final   Retic Count, Absolute 07/28/2019 93.4  19.0 - 186.0 K/uL Final   Immature Retic Fract 07/28/2019 23.6* 2.3 - 15.9 % Final   Performed at [facility], [place] [address] [place], [place] [address]   WBC 07/28/2019 1.2* 4.0 - 10.5 K/uL Final   RBC 07/28/2019 3.49* 3.87 - 5.11 MIL/uL Final   Hemoglobin 07/28/2019 10.1* 12.0 - 15.0 g/dL Final   HCT 07/28/2019 30.9* 36.0 - 46.0 % Final   MCV 07/28/2019 88.5  80.0 - 100.0 fL Final   MCH 07/28/2019 28.9  26.0 - 34.0 pg Final   MCHC 07/28/2019 32.7  30.0 - 36.0 g/dL Final   RDW 07/28/2019 15.9* 11.5 - 15.5 % Final   Platelets 07/28/2019 180  150 - 400 K/uL Final   nRBC 07/28/2019 0.0  0.0 - 0.2 % Final   Neutrophils Relative % 07/28/2019 32  % Final   Neutro Abs 07/28/2019 0.4* 1.7 - 7.7 K/uL Final   This critical result has verified and been called to ROZ WINSTON,RN by Dorian Furnace on 10 19 2020 at 0933, and has been read back.    Lymphocytes Relative 07/28/2019 26  % Final   Lymphs Abs 07/28/2019 0.3* 0.7  - 4.0 K/uL Final   Monocytes Relative 07/28/2019 33  % Final   Monocytes Absolute 07/28/2019 0.4  0.1 - 1.0 K/uL Final   Eosinophils Relative 07/28/2019 2  % Final   Eosinophils Absolute 07/28/2019 0.0  0.0 - 0.5 K/uL Final   Basophils Relative 07/28/2019 1  % Final   Basophils Absolute 07/28/2019 0.0  0.0 - 0.1 K/uL Final   Immature Granulocytes 07/28/2019 6  % Final   Increased IG's, likely caused by Bone Marrow Colony Stimulating Factor received within 30 days.   Abs Immature Granulocytes 07/28/2019 0.07  0.00 - 0.07 K/uL Final   Ovalocytes 07/28/2019 PRESENT   Final   Performed at [facility], [place] [address] [place], [place] [address]    (this displays the last labs from the last 3 days)  Lab Results  Component Value Date   TOTALPROTELP 6.9 05/07/2019   (this displays SPEP labs)  No results found for: KPAFRELGTCHN, LAMBDASER, KAPLAMBRATIO (kappa/lambda light chains)  No results found for: HGBA, HGBA2QUANT, HGBFQUANT, HGBSQUAN (Hemoglobinopathy evaluation)   Lab Results  Component Value Date   LDH 197 (H) 02/27/2019    Lab Results  Component Value Date   IRON 20 (L) 07/28/2019   TIBC 244 07/28/2019   IRONPCTSAT 8 (L) 07/28/2019   (Iron and TIBC)  Lab Results  Component Value Date   FERRITIN 208 07/21/2019    Urinalysis    Component Value Date/Time   COLORURINE YELLOW 07/07/2019 0209   APPEARANCEUR CLOUDY (A) 07/07/2019 0209   LABSPEC 1.011 07/07/2019 0209   PHURINE 6.0 07/07/2019 0209   GLUCOSEU NEGATIVE 07/07/2019 0209   HGBUR NEGATIVE 07/07/2019 0209   BILIRUBINUR NEGATIVE 07/07/2019 [place] 07/07/2019 0209   PROTEINUR >=300 (A) 07/07/2019 0209   UROBILINOGEN 1.0 10/06/2011 1709   NITRITE NEGATIVE 07/07/2019 0209   LEUKOCYTESUR LARGE (A) 07/07/2019 0209     STUDIES:  Dg Chest 2 View  Result Date: 07/07/2019 CLINICAL DATA:  Edema possible compression deformity EXAM: CHEST - 2 VIEW COMPARISON:   Radiograph May 28, 2019, CT December 12, 2018 FINDINGS: Postsurgical changes related to prior CABG including intact  and aligned sternotomy wires and multiple surgical clips projecting over the mediastinum. Cephalized indistinct pulmonary vascularity with interlobular septal and fissural thickening and hazy interstitial opacities in lung bases. Stable calcified granulomata are present in the lungs. There is blunting of the costophrenic sulcus which could reflect either flattening of the diaphragms or trace effusion. Multilevel degenerative changes are present in the spine. No distinct compression deformity is identified however the bones are diffusely osteopenic and superimposition of scapula a may limit detection of the upper thoracic levels. IMPRESSION: 1. Postsurgical changes related to prior CABG. 2. Findings consistent with mild to moderate pulmonary edema. 3. No distinct compression deformity is identified however osteopenia and superimposed scapula may limit detection of subtle fractures. Electronically Signed   By: Lovena Le M.D.   On: 07/07/2019 02:30   Ct L-spine No Charge  Result Date: 07/07/2019 CLINICAL DATA:  Compression fracture EXAM: CT LUMBAR SPINE WITHOUT CONTRAST TECHNIQUE: Multidetector CT imaging of the lumbar spine was performed without intravenous contrast administration. Multiplanar CT image reconstructions were also generated. COMPARISON:  Abdominal CT 01/18/2018 FINDINGS: Segmentation: 5 lumbar type vertebrae Alignment: Lower lumbar dextroscoliosis and upper lumbar levoscoliosis. No traumatic malalignment. Vertebrae: L4 superior endplate fracture that has occurred since comparison but is chronic appearing. There may have also been a left L5 transverse process fracture in the interim. No acute fracture. No evident bone lesion or erosion Paraspinal and other soft tissues: Reported separately Disc levels: T12- L1: Left-sided disc narrowing and facet spurring with moderate left foraminal  stenosis. L1-L2: Mild disc narrowing and bulging.  Mild facet spurring. L2-L3: Disc narrowing and bulging with facet spurring causing mild bilateral foraminal stenosis. L3-L4: Disc narrowing and bulging. Degenerative facet spurring that is moderate. Moderate right more than left foraminal narrowing. Moderate or advanced spinal stenosis. L4-L5: Asymmetric left-sided degenerative disc collapse and ridging. Asymmetric left facet spurring. Left foraminal impingement. Mild spinal stenosis. L5-S1:Greatest level of degenerative disc narrowing with spurring. Mild facet degeneration. The canal and foramina appear patent. IMPRESSION: 1. No acute finding. 2. L4 superior endplate fracture that has occurred since 2019 but is chronic appearing. 3. Degenerative disease with scoliosis as described. Electronically Signed   By: Monte Fantasia M.D.   On: 07/07/2019 04:55   Ct Renal Stone Study  Result Date: 07/07/2019 CLINICAL DATA:  Flank pain with stone disease suspected EXAM: CT ABDOMEN AND PELVIS WITHOUT CONTRAST TECHNIQUE: Multidetector CT imaging of the abdomen and pelvis was performed following the standard protocol without IV contrast. COMPARISON:  02/22/2018 FINDINGS: Lower chest: Mild atelectasis at the lung bases. Trace bilateral pleural fluid. Calcified granuloma in the lingula. Hepatobiliary: No focal liver abnormality.Cholecystectomy. No common bile duct dilatation. Pancreas: Unremarkable. Spleen: Calcifications that are likely both arterial and granulomatous. Adrenals/Urinary Tract: Negative adrenals. No hydronephrosis or stone. Moderate gas within the bladder lumen, please correlate with urinalysis. Stomach/Bowel: No obstruction. No evidence of inflammation. Scattered stool without colonic over distension or rectal impaction. Distal colonic diverticulosis. Vascular/Lymphatic: No acute vascular abnormality. Widespread atherosclerotic calcification with narrow appearance of the bilateral iliacs. A left iliac stent  is present. No mass or adenopathy. Reproductive:Hysterectomy with possible oophorectomies. Other: No significant ascites or pneumoperitoneum. Musculoskeletal: No acute abnormalities. Spinal degeneration with scoliosis as described on dedicated study. Advanced right hip osteoarthritis. Osteitis pubis. Generalized osteopenia. IMPRESSION: 1. No hydronephrosis or urolithiasis. 2. Moderate gas in the urinary bladder, please correlate with urinalysis. 3. Extensive atherosclerosis. Electronically Signed   By: Monte Fantasia M.D.   On: 07/07/2019 05:24  ELIGIBLE FOR AVAILABLE RESEARCH PROTOCOL: no   ASSESSMENT: 78 y.o. [place] woman with multifactorial anemia, as follows:             (a) anemia of chronic inflammation: As of May 2020 she has a SED rate of 95, CRP 1.1, positive ANA and RF; subsequently she was found to be ANCA positive             (b) hemolysis (mild): she has a mildly elevated LDH and a DAT positive for complement, not IgG; this is c/w (a) above             (c) anemia of renal insufficiency: inadequate EPO resonse to anemia              (d) GIB/ iron deficiency in patient on lifelong anticoagulaton  (1) Feraheme received 03/01/2019, repeated 03/20/2019   (a) reticulocyte 03/07/2019 up from 43.4 a year ago to 191.8 after iron infusion  (b) feraheme repeated on 05/19/2019  (2) darbepoetin: starting 05/05/2019 at 180mcg given every 2 weeks  (a) dose increased to 200 mcg every week starting 07/01/2019.    (3) ANCA positive vasculitis: diagnosed 05/08/2019 (titer 1:640)  (a) prednisone 40 mg daily started 05/31/2019, on taper  (b) rituximab weekly started 06/12/2019 (received test dose 06/05/2019   ([name]) hepatitis B sAg, core Ab and HIV negative 05/08/2019   (ii) rituximab held as of 07/21/2019 with neutropenia  (4) osteopenia with a T score of -1.5 on bone density 02/28/2016 at the [facility] center    PLAN: [name] is doing moderately well today.  The positive, is that her  hemoglobin is 10.1 today.  She does not require the Aranesp, and that is certainly a step continuing in the right direction.  She does however continue to have some neutropenia, and will receive Zarxio (neupogen biosimilar) today.  She will not receive Rituximab, and neutropenic precautions were reviewed.  [name] will not prescribe any further antibiotics for this, as her [place] is improved, and to avoid any further interactions between the coumadin and antibiocs considering her need to start Fluconazole (noted below).    [name] talked about her fatigue and depression.  This is likely multifactorial.  The prednisone can cause irritability, and the counts being down and anemia can contribute to fatigue.  She also has some muscle weakness in her upper legs.  [name] encouraged her to be patient with herself, and hope that after she gets the neupogen and her counts recover that will help.  [name] also counseled her on some chair exercises she can do with her legs that will help with regaining strength, which will in turn help support her stamina and ability to walk around and do things for longer periods of time.  She would like to avoid any medication if possible, and [name] think these activities are a start.  [name] will ask our counselor to reach out to her as well.     She also has thrush, that appears very uncomfortable.  She is on Coumadin, and preferentially we would prescribe a mouthrinse, to avoid any interactions with the coumadin, however due to the extent and location of the thrush, we simply cannot do that.  My nurse has reached out to the coumadin clinic, for guidance on any changes she should make, in regards to the Coumadin along with the Fluconazole.  [name] is aware that her Coumadin clinic team will reach out to her about any adjustments that should be made in her schedule or appointments  due to this.    We will see [name] back next week for labs, follow up, and potentially Rituximab.  She was recommended to  continue with the appropriate pandemic precautions. She knows to call for any questions that may arise between now and her next appointment.  We are happy to see her sooner if needed.  A total of (30) minutes of face-to-face time was spent with this patient with greater than 50% of that time in counseling and care-coordination.  Wilber Bihari, NP  07/28/19 9:55 AM Medical Oncology and Hematology [facility] [place], [place] [address] Tel. [phone]    Fax. [phone]

## 2019-07-29 ENCOUNTER — Ambulatory Visit (HOSPITAL_COMMUNITY)
Admission: RE | Admit: 2019-07-29 | Discharge: 2019-07-29 | Disposition: A | Discharge status: Home / Self Care | Payer: Medicare HMO | Source: Ambulatory Visit | Attending: Cardiology | Admitting: Cardiology

## 2019-07-29 ENCOUNTER — Other Ambulatory Visit: Payer: Self-pay

## 2019-07-29 ENCOUNTER — Telehealth: Payer: Self-pay | Admitting: Adult Health

## 2019-07-29 ENCOUNTER — Other Ambulatory Visit (HOSPITAL_COMMUNITY): Payer: Self-pay

## 2019-07-29 DIAGNOSIS — I5032 Chronic diastolic (congestive) heart failure: Secondary | ICD-10-CM | POA: Diagnosis not present

## 2019-07-29 LAB — BASIC METABOLIC PANEL
Anion gap: 13 (ref 5–15)
BUN: 63 mg/dL — ABNORMAL HIGH (ref 8–23)
CO2: 21 mmol/L — ABNORMAL LOW (ref 22–32)
Calcium: 9.7 mg/dL (ref 8.9–10.3)
Chloride: 96 mmol/L — ABNORMAL LOW (ref 98–111)
Creatinine, Ser: 1.98 mg/dL — ABNORMAL HIGH (ref 0.44–1.00)
GFR calc Af Amer: 27 mL/min — ABNORMAL LOW (ref >60)
GFR calc non Af Amer: 23 mL/min — ABNORMAL LOW (ref >60)
Glucose, Bld: 310 mg/dL — ABNORMAL HIGH (ref 70–99)
Potassium: 4 mmol/L (ref 3.5–5.1)
Sodium: 130 mmol/L — ABNORMAL LOW (ref 135–145)

## 2019-07-29 NOTE — Telephone Encounter (Signed)
Spoke with patient.  She will start fluconazole 200 mg qd x 5 days, with the first dose today.  She can take today's dose of warfarin, but should only take 2.5 mg (1/2 tablet) on Wednesday and Thursday.  She is already scheduled for repeat INR on Friday.  She will keep this appointment.  Patient voiced understanding.

## 2019-07-29 NOTE — Telephone Encounter (Signed)
No los per 10/19.

## 2019-07-29 NOTE — Patient Outreach (Signed)
[facility]) Care Management  07/29/2019  [name] 05/08/1941 [id number]   Telephone call to patient for follow up. No answer. HIPAA compliant voice message left.  Plan: RN CM will attempt patient again within 4 business days and send letter.  [name], RN, MSN [place] Management Care Management Coordinator Direct Line [phone] Cell [phone] Toll Free: [phone]  Fax: [phone]

## 2019-07-30 ENCOUNTER — Other Ambulatory Visit: Payer: Self-pay | Admitting: Pharmacist

## 2019-07-30 ENCOUNTER — Telehealth (HOSPITAL_COMMUNITY): Payer: Self-pay

## 2019-07-30 NOTE — Telephone Encounter (Signed)
Pt made aware lab results and recommendation to repeat blood work in 2 weeks. appt date and time provided. Verbalized understanding. appt card mailed

## 2019-07-30 NOTE — Telephone Encounter (Signed)
-----   Message from [name], MD sent at 07/29/2019  3:28 PM EDT ----- No changes for now but repeat BMET in 2 wks with creatinine a little higher.

## 2019-07-30 NOTE — Patient Outreach (Signed)
[place] [facility]) Care Management  07/30/2019  [name] 01/23/1941 [id number]   Patient was called to follow up on the sore throat she complained about on 07/25/2019.  HIPAA identifiers were obtained. Patient said she did not call her provider as encouraged to do last week.  She had an appointment on Monday and said she was told she had developed thrush. Her provider gave her fluconazole to help.    She reported that her throat felt much better today.   Plan: Follow up with patient in 2 weeks. Consider closing case.  [name], PharmD, [place] Clinical Pharmacist [phone]

## 2019-07-31 ENCOUNTER — Other Ambulatory Visit: Payer: Self-pay

## 2019-07-31 ENCOUNTER — Ambulatory Visit: Payer: Medicare HMO

## 2019-07-31 NOTE — Patient Outreach (Signed)
[place] [facility]) Care Management  [place]  07/31/2019   [name] 08-21-1941 [id number]  Subjective: Telephone call to patient for follow up.  Patient reports improved some but still has weakness and using walker.  Patient somewhat frustrated about being sick so long and just wants to get better.  Patient reports that her white counts were low and she did not receive a treatment the last 2 weeks.  Discussed with patient managing and dealing with her illness and importance of MD follow up and treatments.  Also discussed diet and gave options of things to eat. Patient appreciative of information. Patient weight remains steady and no swelling.  Patient declines no other needs at this time.     Objective:   Encounter Medications:  Outpatient Encounter Medications as of 07/31/2019  Medication Sig Note  . acetaminophen (TYLENOL) 325 MG tablet Take 650 mg by mouth every 6 (six) hours as needed for mild pain.    [name] allopurinol (ZYLOPRIM) 100 MG tablet Take 100 mg by mouth daily.   [name] ALPRAZolam (XANAX) 0.5 MG tablet Take 0.25 mg by mouth at bedtime.  06/20/2019: "Dr Aundra Dubin increased it to a whole tablet 0.50 mg to see if it will help me sleep".  [name] atorvastatin (LIPITOR) 80 MG tablet Take 1 tablet (80 mg total) by mouth daily at 6 PM.   . Cholecalciferol (VITAMIN D3) 1000 UNITS CAPS Take 2,000 Units by mouth daily with lunch.    . ciprofloxacin (CIPRO) 250 MG tablet Take 1 tablet (250 mg total) by mouth 2 (two) times daily.   . cloNIDine (CATAPRES) 0.2 MG tablet Take 1 tablet (0.2 mg total) by mouth 2 (two) times daily.   . Cyanocobalamin (VITAMIN B12) 1000 MCG TBCR Take 1,000 mcg by mouth daily with lunch.    . feeding supplement, GLUCERNA SHAKE, (GLUCERNA SHAKE) LIQD Take 237 mLs by mouth 2 (two) times daily between meals. 06/20/2019: Drinks Boost , not GLucerna  . fluconazole (DIFLUCAN) 200 MG tablet Take 1 tablet (200 mg total) by mouth daily.   . furosemide (LASIX) 40  MG tablet Take 2 tablets (80 mg total) by mouth every morning AND 1 tablet (40 mg total) every evening.   . hydrALAZINE (APRESOLINE) 100 MG tablet Take 1 tablet (100 mg total) by mouth 3 (three) times daily.   . Magnesium 500 MG TABS Take 500 mg by mouth daily.   . magnesium oxide (MAG-OX) 400 (241.3 Mg) MG tablet Take 1 tablet (400 mg total) by mouth daily.   . metFORMIN (GLUCOPHAGE) 500 MG tablet Take 500 mg by mouth 2 (two) times daily with a meal.    . metoprolol tartrate (LOPRESSOR) 25 MG tablet Take 0.5 tablets (12.5 mg total) by mouth 2 (two) times daily.   . ondansetron (ZOFRAN ODT) 4 MG disintegrating tablet Take 1 tablet (4 mg total) by mouth every 8 (eight) hours as needed for nausea or vomiting.   . pantoprazole (PROTONIX) 40 MG tablet Take 40 mg by mouth 2 (two) times daily.    . predniSONE (DELTASONE) 5 MG tablet Take 3 tablets (15 mg total) by mouth daily with breakfast.   . riTUXimab F6213 (RITUXAN) 10 MG/ML injection Inject into the vein every Tuesday.  07/07/2019: Pt gets at the cancer center  . spironolactone (ALDACTONE) 25 MG tablet Take 1 tablet (25 mg total) by mouth daily.   . tadalafil, PAH, (ADCIRCA) 20 MG tablet Take 2 tablets (40 mg total) by mouth daily.   [name]  traMADol (ULTRAM) 50 MG tablet Take 1 tablet (50 mg total) by mouth every 6 (six) hours as needed.   . vitamin C (ASCORBIC ACID) 500 MG tablet Take 500 mg by mouth daily with lunch.   . warfarin (COUMADIN) 5 MG tablet Take 5-7.5 mg by mouth daily. Take 1.5 tablets (7.5 mg) on Tues, Thursday, Saturday and Sunday. Take 1 tablet (5mg ) on Mondays, Wednesdays, and Fridays.    No facility-administered encounter medications on file as of 07/31/2019.     Functional Status:  In your present state of health, do you have any difficulty performing the following activities: 05/26/2019 05/08/2019  Hearing? N N  Vision? N N  Difficulty concentrating or making decisions? N N  Walking or climbing stairs? Y N  Comment secondary to  weakness and shortness of breath -  Dressing or bathing? N Y  Doing errands, shopping? Y N  Some recent data might be hidden    Fall/Depression Screening: Fall Risk  03/07/2019 12/27/2018 10/22/2018  Falls in the past year? 1 0 0  Comment - - -  Number falls in past yr: 1 - -  Injury with Fall? 1 - -  Risk for fall due to : History of fall(s) - -  Risk for fall due to: Comment - - -  Follow up Falls prevention discussed - -   PHQ 2/9 Scores 07/31/2019 05/12/2019 03/24/2019 07/09/2018 06/13/2018 04/04/2018  PHQ - 2 Score 2 3 1  0 0 0  PHQ- 9 Score 9 9 - - - -    Assessment: Patient continues to manage chronic illnesses.  Patient continues to follow up with providers as scheduled.     Plan:  [facility] CM Care Plan Problem One     Most Recent Value  Care Plan Problem One  Activity intolerance related to anemia  Role Documenting the Problem One  Care Management Telephonic [place] for Problem One  Active  [facility] Long Term Goal   Patient will report continued ability to complete activities of daily living over the next 60 days.  [facility] Long Term Goal Start Date  07/31/19  Interventions for Problem One Long Term Goal  Discussed with patient current treatments and appointments.  Discussed with patient activity tolerance and energy conservation techniques and utilizing assistive devices.  Reviewed with patient diet options.         RN CM will contact patient in the month of November and patient agrees.  [name], RN, MSN [place] Management Care Management Coordinator Direct Line [phone] Cell [phone] Toll Free: [phone]  Fax: [phone]

## 2019-08-01 ENCOUNTER — Ambulatory Visit (INDEPENDENT_AMBULATORY_CARE_PROVIDER_SITE_OTHER): Payer: Medicare HMO | Admitting: Pharmacist Clinician (PhC)/ Clinical Pharmacy Specialist

## 2019-08-01 ENCOUNTER — Other Ambulatory Visit: Payer: Self-pay

## 2019-08-01 DIAGNOSIS — Z7901 Long term (current) use of anticoagulants: Secondary | ICD-10-CM | POA: Diagnosis not present

## 2019-08-01 DIAGNOSIS — Z952 Presence of prosthetic heart valve: Secondary | ICD-10-CM

## 2019-08-01 LAB — POCT INR: INR: 4.9 — AB (ref 2.0–3.0)

## 2019-08-01 NOTE — Patient Instructions (Signed)
No warfarin Friday Oct 23 or Saturday Oct 24, then continue with 1.5 tablets daily except 1 tablet on Mondays , Wednesdays and Fridays.  Repeat INR in 10 days.  Call [phone] if you need to change the appointment

## 2019-08-03 NOTE — Progress Notes (Signed)
[place]  [phone] [phone]    ID: [name] DOB: [birth year]  MR#: [id number]  BWL#:[id number]  Patient Care Team: [name], MD as PCP - General (Internal Medicine) [name], MD as PCP - Advanced Heart Failure (Cardiology) Lorretta Harp, MD as PCP - Cardiology (Cardiology) Tupac Jeffus, [name], MD as Consulting Physician (Hematology and Oncology) Arta Silence, MD as Consulting Physician (Gastroenterology) [name], MD as Consulting Physician (Cardiology) Marzetta Board, DPM as Consulting Physician (Podiatry) Gavin Pound, MD as Consulting Physician (Rheumatology) [name], [facility] as [place] [facility] (Pharmacist) Jon Billings, RN as [place] [facility] OTHER MD:   CHIEF COMPLAINT: anemia and thrombocytopenia  CURRENT TREATMENT: [Feraheme], Aranesp, prednisone, [rituximab]   INTERVAL HISTORY: [name] returns today for follow-up and treatment of her multifactorial anemia and subsequent leukopenia  She continues on aranesp for her anemia of renal insufficiency, currently at 200 mcg weekly. She is due for a dose today.    She started rituximab 05/30/2019 (test dose) and received 5 weekly doses, last 07/14/2019, when she was found tobe severely leukopenic and rituxan was held. She received prophylactic cipro x 6 days. Repeat WBC today is greater than 10.  We are tapeing her prednisone, currently at 15 mg/d.  She is tolerating that with no side effects that she is aware of.  Her last blood transfusion was 05/27/2019. Her Hb has been trending upwards Results for [name], [name] (MRN [id number]) as of 08/03/2019 19:18  Ref. Range 05/27/2019 04:02 05/28/2019 12:07 05/29/2019 04:20 05/30/2019 03:37 05/31/2019 07:28 06/05/2019 09:47 06/12/2019 08:53 06/20/2019 08:59 07/01/2019 11:33 07/07/2019 02:08 07/08/2019 08:37 07/14/2019 09:03 07/21/2019 09:05 07/28/2019 08:45    Hemoglobin Latest Ref Range: 12.0 - 15.0 g/dL 6.9 (LL) 8.3 (L) 8.0 (L) 8.7 (L) 8.8 (L) 8.8 (L) 9.4 (L) 8.8 (L) 9.3 (L) 10.2 (L) 9.2 (L) 9.4 (L) 9.2 (L) 10.1 (L)   Her ferritin remains >100 after her last feraheme dose, 05/12/2019: Results for [name], [name] (MRN [id number]) as of 08/03/2019 19:18  Ref. Range 04/21/2019 10:27 04/28/2019 14:15 05/05/2019 11:09 05/12/2019 12:15 05/19/2019 11:53 05/26/2019 17:35 06/05/2019 09:47 06/12/2019 08:53 06/20/2019 08:59 07/01/2019 11:33 07/08/2019 08:38 07/14/2019 09:02 07/21/2019 09:05 07/28/2019 08:45  Ferritin Latest Ref Range: 11 - 307 ng/mL 251 156 118 197 466 (H) 178 275 322 (H) 158 158 147 134 208 167   Her reticulocyte counts remain very variable Results for [name], [name] (MRN [id number]) as of 08/03/2019 19:18  Ref. Range 03/20/2019 10:03 04/21/2019 10:28 04/28/2019 14:15 05/05/2019 11:10 05/12/2019 12:16 05/19/2019 11:52 06/05/2019 09:47 06/12/2019 08:54 06/20/2019 08:53 07/01/2019 11:33 07/08/2019 08:38 07/14/2019 09:03 07/21/2019 09:05 07/28/2019 08:46  Retic Count, Absolute Latest Ref Range: 19.0 - 186.0 K/uL 94.7 60.3 54.5 63.4 92.9 208.5 (H) 110.5 54.8 100.1 80.8 65.9 113.7 76.9 93.4   REVIEW OF SYSTEMS: [name] has had no fever, rash, bleeding, adenopathy, unusual headaches, visual changes, nausea, vomiting, or change in bowel or bladder habits.  She was a bit fatigued a few days ago but she says she has been feeling better the last few days.  She is taking appropriate pandemic precautions.  A detailed review of systems today was stable.   HISTORY OF CURRENT ILLNESS: From the original consult note:  [name] is an 78 y.o. female from [place], [place].  She has a past medical history significant forPAD s/p stenting; moderate to severe pulmonary HTN; afib and AVR on Coumadin; HTN; HLD; DM;  and CAD s/p CABG. the patient was admitted to the hospital for anemia.  She was having increasing fatigue and shortness of breath and had routine lab work drawn which  showed a hemoglobin of 6.6.  Due to her abnormal lab work, she was advised to come to the emergency room for admission.  She was also admitted in February 2020 for symptomatic anemia.  She reports she has been anemic for at least a year and has been taking oral iron.  She underwent a colonoscopy and EGD on 01/22/2018.  The colonoscopy showed diverticulosis in the sigmoid colon and descending colon, internal hemorrhoids.  The upper endoscopy was normal except for erythematous mucosa in the antrum.  She had a repeat EGD on 11/21/2018 that showed a few minimally oozing( with scope trauma) superficial gastric ulcers with pigmented material were found in the gastric body. The largest lesion was 4 mm in largest dimension.  She underwent another upper endoscopy on 02/27/2019 which showed patchy mildly friable mucosa with contact bleeding was found in the gastric fundus, in the gastric body and in the gastric antrum.  The patient is maintained on warfarin due to A. fib and a mechanical aortic valve.  Review of the patient's chart shows that she has been anemic dating back to at least December thousand 12 (these are the earliest labs available to me).  Her highest hemoglobin was 11.6 in December 2012.  I also note that the patient has had intermittent mild leukopenia adding back to at least April 2019.  Her lowest white blood cell count was 2.6 in April 2019.  Since admission, the patient has received 1 unit of packed red blood cells.  She is currently on heparin for her procedure with plan to bridge back to Coumadin.  Stool for occult blood was negative x1 on 02/24/2019.  Hematology was asked see the patient to make recommendations regarding her anemia.  [name] subsequent history is as detailed below.   PAST MEDICAL HISTORY: Past Medical History:  Diagnosis Date   Aortic atherosclerosis ([place]) 01/23/2018   Atrial fibrillation ([facility])    Carotid artery disease ([place])    Cholelithiasis 01/23/2018   Chronic  anticoagulation    Coronary artery disease    status post coronary artery bypass grafting times 07/10/2004   Diabetes mellitus    Hypercholesteremia    Hypertension    Mechanical heart valve present    H. aortic valve replacement at the time of bypass surgery October 2005   Moderate to severe pulmonary hypertension ([place])    Peripheral arterial disease ([place])    history of left common iliac artery PTA and stenting for a chronic total occlusion 08/26/01     PAST SURGICAL HISTORY: Past Surgical History:  Procedure Laterality Date   AORTIC VALVE REPLACEMENT     CARDIAC CATHETERIZATION  11/10/2004   40% right common illiac, 70% in stent restenosis of distal left common illiac,    CARDIAC CATHETERIZATION  05/18/2004   LAD 50-70% midstenosis, RCA dominant w/50% stenosis, 50% Right common Illiac artery ostial stenosis, 90% in stent restenosis within midportion of left common illiac stent   Carotid Duplex  03/12/2012   RSA-elev. velocities suggestive of a 50-69% diameter reduction, Right&Left Bulb/Prox ICA-mild-mod.fibrous plaqueelevating Velocities abnormal study.   CHOLECYSTECTOMY N/A 03/01/2018   Procedure: LAPAROSCOPIC CHOLECYSTECTOMY;  Surgeon: Judeth Horn, MD;  Location: [facility];  Service: General;  Laterality: N/A;   COLONOSCOPY WITH PROPOFOL N/A 01/22/2018   Procedure: COLONOSCOPY WITH PROPOFOL;  Surgeon: Wilford Corner, MD;  Location: [facility];  Service: Endoscopy;  Laterality: N/A;   ESOPHAGOGASTRODUODENOSCOPY (EGD) WITH PROPOFOL N/A 01/22/2018   Procedure: ESOPHAGOGASTRODUODENOSCOPY (EGD) WITH PROPOFOL;  Surgeon: Wilford Corner, MD;  Location: [facility];  Service: Endoscopy;  Laterality: N/A;   ESOPHAGOGASTRODUODENOSCOPY (EGD) WITH PROPOFOL N/A 11/21/2018   Procedure: ESOPHAGOGASTRODUODENOSCOPY (EGD) WITH PROPOFOL;  Surgeon: [name], MD;  Location: [place] [facility];  Service: Gastroenterology;  Laterality: N/A;   ESOPHAGOGASTRODUODENOSCOPY (EGD) WITH PROPOFOL  Left 02/27/2019   Procedure: ESOPHAGOGASTRODUODENOSCOPY (EGD) WITH PROPOFOL;  Surgeon: Arta Silence, MD;  Location: [facility];  Service: Endoscopy;  Laterality: Left;   GIVENS CAPSULE STUDY N/A 11/21/2018   Procedure: GIVENS CAPSULE STUDY;  Surgeon: [name], MD;  Location: [place];  Service: Gastroenterology;  Laterality: N/A;  To be deployed during EGD   Lower Ext. Duplex  03/12/2012   Right Proximal CIA- vessel narrowing w/elevated velocities 0-49% diameter reduction. Right SFA-mild mixed density plaque throughout vessel.   NM MYOCAR PERF WALL MOTION  05/19/2010   protocol: Persantine, post stress EF 65%, negative for ischemia, low risk scan   RIGHT HEART CATH N/A 06/27/2018   Procedure: RIGHT HEART CATH;  Surgeon: [name], MD;  Location: [facility];  Service: Cardiovascular;  Laterality: N/A;   TRANSTHORACIC ECHOCARDIOGRAM  08/29/2012   Moderately calcified annulus of mitral valve, moderate regurg. of both mitral valve and tricuspid valve.     FAMILY HISTORY: Family History  Problem Relation Age of Onset   Breast cancer Neg Hx     GYNECOLOGIC HISTORY:  No LMP recorded. Patient has had a hysterectomy. Menarche:  years old Age at first live birth:  [place] P: 3 LMP:  Contraceptive:  HRT:   Hysterectomy?: yes BSO?:    SOCIAL HISTORY: (Current as of 03/07/2019) Faviola is widowed.  She lives by herself.  She has 3 children that live locally.  She smoked three quarters of a pack of cigarettes daily for 50 years.  She quit in 2005.  Denies alcohol use.   ADVANCED DIRECTIVES: Not in place    HEALTH MAINTENANCE: Social History   Tobacco Use   Smoking status: Former Smoker    Packs/day: 1.00    Years: 30.00    Pack years: 30.00    Types: Cigarettes   Smokeless tobacco: Never Used   Tobacco comment: quit smoking 2005  Substance Use Topics   Alcohol use: No    Alcohol/week: 0.0 standard drinks   Drug use: No    Colonoscopy: EGD on  02/27/2019 under Dr. Paulita Fujita found some friable gastric mucosa.  Colonoscopy under Dr. Michail Sermon 01/22/2018 showed significant diverticular disease  PAP:   Bone density:  Mammography: 02/25/2017, breast density category B.  Allergies  Allergen Reactions   Flagyl [Metronidazole] Rash    ALL-OVER BODY RASH   Coreg [Carvedilol] Other (See Comments)    Terrible cramping in the feet and had a lot of bowel movements, but not diarrhea   Losartan Swelling    Patient doesn't recall site of swelling   Verapamil Hives   Zetia [Ezetimibe] Other (See Comments)    Reaction not recalled   Zocor [Simvastatin - High Dose] Other (See Comments)    Reaction not recalled    Current Outpatient Medications  Medication Sig Dispense Refill   acetaminophen (TYLENOL) 325 MG tablet Take 650 mg by mouth every 6 (six) hours as needed for mild pain.      allopurinol (ZYLOPRIM) 100 MG tablet Take 100 mg by mouth daily.  ALPRAZolam (XANAX) 0.5 MG tablet Take 0.25 mg by mouth at bedtime.      atorvastatin (LIPITOR) 80 MG tablet Take 1 tablet (80 mg total) by mouth daily at 6 PM.     Cholecalciferol (VITAMIN D3) 1000 UNITS CAPS Take 2,000 Units by mouth daily with lunch.      cloNIDine (CATAPRES) 0.2 MG tablet Take 1 tablet (0.2 mg total) by mouth 2 (two) times daily. 60 tablet 3   Cyanocobalamin (VITAMIN B12) 1000 MCG TBCR Take 1,000 mcg by mouth daily with lunch.      feeding supplement, GLUCERNA SHAKE, (GLUCERNA SHAKE) LIQD Take 237 mLs by mouth 2 (two) times daily between meals. 237 mL 30   fluconazole (DIFLUCAN) 200 MG tablet Take 1 tablet (200 mg total) by mouth daily. 5 tablet 2   furosemide (LASIX) 40 MG tablet Take 2 tablets (80 mg total) by mouth every morning AND 1 tablet (40 mg total) every evening. 90 tablet 3   hydrALAZINE (APRESOLINE) 100 MG tablet Take 1 tablet (100 mg total) by mouth 3 (three) times daily. 270 tablet 1   Magnesium 500 MG TABS Take 500 mg by mouth daily.      magnesium oxide (MAG-OX) 400 (241.3 Mg) MG tablet Take 1 tablet (400 mg total) by mouth daily.     metFORMIN (GLUCOPHAGE) 500 MG tablet Take 500 mg by mouth 2 (two) times daily with a meal.      metoprolol tartrate (LOPRESSOR) 25 MG tablet Take 0.5 tablets (12.5 mg total) by mouth 2 (two) times daily. 60 tablet 0   ondansetron (ZOFRAN ODT) 4 MG disintegrating tablet Take 1 tablet (4 mg total) by mouth every 8 (eight) hours as needed for nausea or vomiting. 6 tablet 0   pantoprazole (PROTONIX) 40 MG tablet Take 40 mg by mouth 2 (two) times daily.      predniSONE (DELTASONE) 5 MG tablet Take 2 tablets (10 mg total) by mouth daily with breakfast. 90 tablet 2   spironolactone (ALDACTONE) 25 MG tablet Take 1 tablet (25 mg total) by mouth daily. 90 tablet 3   tadalafil, PAH, (ADCIRCA) 20 MG tablet Take 2 tablets (40 mg total) by mouth daily. 60 tablet 6   traMADol (ULTRAM) 50 MG tablet Take 1 tablet (50 mg total) by mouth every 6 (six) hours as needed. 15 tablet 0   vitamin C (ASCORBIC ACID) 500 MG tablet Take 500 mg by mouth daily with lunch.     warfarin (COUMADIN) 5 MG tablet Take 5-7.5 mg by mouth daily. Take 1.5 tablets (7.5 mg) on Tues, Thursday, Saturday and Sunday. Take 1 tablet (5mg ) on Mondays, Wednesdays, and Fridays.     No current facility-administered medications for this visit.      OBJECTIVE: older white woman examined in a wheelchair  Vitals:   08/04/19 1135  BP: (!) 175/66  Pulse: (!) 54  Resp: 18  Temp: 98.3 F (36.8 C)  SpO2: 98%   Wt Readings from Last 3 Encounters:  08/04/19 122 lb 14.4 oz (55.7 kg)  07/28/19 125 lb 4.8 oz (56.8 kg)  07/21/19 126 lb 8 oz (57.4 kg)   Body mass index is 22.48 kg/m.    ECOG FS:2 - Symptomatic, <50% confined to bed  Sclerae unicteric, EOMs intact Wearing a mask No cervical or supraclavicular adenopathy, no axillary adenopathy Lungs no rales or rhonchi Heart regular rate and rhythm Abd soft, nontender, positive bowel  sounds MSK no focal spinal tenderness, no upper extremity lymphedema Neuro: nonfocal, well  oriented, appropriate affect Breasts: Deferred   LAB RESULTS:  CMP     Component Value Date/Time   NA 130 (L) 07/29/2019 0959   NA 137 12/06/2017 1436   K 4.0 07/29/2019 0959   CL 96 (L) 07/29/2019 0959   CO2 21 (L) 07/29/2019 0959   GLUCOSE 310 (H) 07/29/2019 0959   BUN 63 (H) 07/29/2019 0959   BUN 20 12/06/2017 1436   CREATININE 1.98 (H) 07/29/2019 0959   CREATININE 1.64 (H) 07/28/2019 0845   CALCIUM 9.7 07/29/2019 0959   PROT 5.6 (L) 07/28/2019 0845   ALBUMIN 2.8 (L) 07/28/2019 0845   AST 18 07/28/2019 0845   ALT 12 07/28/2019 0845   ALKPHOS 89 07/28/2019 0845   BILITOT 0.4 07/28/2019 0845   GFRNONAA 23 (L) 07/29/2019 0959   GFRNONAA 29 (L) 07/28/2019 0845   GFRAA 27 (L) 07/29/2019 0959   GFRAA 34 (L) 07/28/2019 0845    Lab Results  Component Value Date   TOTALPROTELP 6.9 05/07/2019    No results found for: KPAFRELGTCHN, LAMBDASER, [facility]  Lab Results  Component Value Date   WBC 10.2 08/04/2019   NEUTROABS 9.0 (H) 08/04/2019   HGB 10.7 (L) 08/04/2019   HCT 32.9 (L) 08/04/2019   MCV 88.7 08/04/2019   PLT 156 08/04/2019    @LASTCHEMISTRY @  No results found for: LABCA2  No components found for: URKYHC623  Recent Labs  Lab 08/01/19 1138  INR 4.9*    No results found for: LABCA2  No results found for: JSE831  No results found for: DVV616  No results found for: WVP710  No results found for: CA2729  No components found for: HGQUANT  No results found for: CEA1 / No results found for: CEA1   No results found for: AFPTUMOR  No results found for: CHROMOGRNA  No results found for: PSA1  Appointment on 08/04/2019  Component Date Value Ref Range Status   WBC 08/04/2019 10.2  4.0 - 10.5 K/uL Final   RBC 08/04/2019 3.71* 3.87 - 5.11 MIL/uL Final   Hemoglobin 08/04/2019 10.7* 12.0 - 15.0 g/dL Final   HCT 08/04/2019 32.9* 36.0 - 46.0 % Final    MCV 08/04/2019 88.7  80.0 - 100.0 fL Final   MCH 08/04/2019 28.8  26.0 - 34.0 pg Final   MCHC 08/04/2019 32.5  30.0 - 36.0 g/dL Final   RDW 08/04/2019 16.1* 11.5 - 15.5 % Final   Platelets 08/04/2019 156  150 - 400 K/uL Final   nRBC 08/04/2019 0.0  0.0 - 0.2 % Final   Neutrophils Relative % 08/04/2019 88  % Final   Neutro Abs 08/04/2019 9.0* 1.7 - 7.7 K/uL Final   Lymphocytes Relative 08/04/2019 4  % Final   Lymphs Abs 08/04/2019 0.4* 0.7 - 4.0 K/uL Final   Monocytes Relative 08/04/2019 4  % Final   Monocytes Absolute 08/04/2019 0.4  0.1 - 1.0 K/uL Final   Eosinophils Relative 08/04/2019 1  % Final   Eosinophils Absolute 08/04/2019 0.1  0.0 - 0.5 K/uL Final   Basophils Relative 08/04/2019 1  % Final   Basophils Absolute 08/04/2019 0.1  0.0 - 0.1 K/uL Final   Immature Granulocytes 08/04/2019 2  % Final   Abs Immature Granulocytes 08/04/2019 0.18* 0.00 - 0.07 K/uL Final   Performed at [facility], [place] [address], [place], [place] [address]   Retic Ct Pct 08/04/2019 2.9  0.4 - 3.1 % Final   RBC. 08/04/2019 3.74* 3.87 - 5.11 MIL/uL Final  Retic Count, Absolute 08/04/2019 107.7  19.0 - 186.0 K/uL Final   Immature Retic Fract 08/04/2019 17.2* 2.3 - 15.9 % Final   Performed at [facility], [place] [address], [place], [place] [address]   Smear Review 08/04/2019 SMEAR STAINED AND AVAILABLE FOR REVIEW   Final   Performed at [facility], [place] [address], [place], [place] [address]    (this displays the last labs from the last 3 days)  Lab Results  Component Value Date   TOTALPROTELP 6.9 05/07/2019   (this displays SPEP labs)  No results found for: KPAFRELGTCHN, LAMBDASER, KAPLAMBRATIO (kappa/lambda light chains)  No results found for: HGBA, HGBA2QUANT, HGBFQUANT, HGBSQUAN (Hemoglobinopathy evaluation)   Lab Results  Component Value Date   LDH 197 (H) 02/27/2019    Lab Results    Component Value Date   IRON 20 (L) 07/28/2019   TIBC 244 07/28/2019   IRONPCTSAT 8 (L) 07/28/2019   (Iron and TIBC)  Lab Results  Component Value Date   FERRITIN 167 07/28/2019    Urinalysis    Component Value Date/Time   COLORURINE YELLOW 07/07/2019 0209   APPEARANCEUR CLOUDY (A) 07/07/2019 0209   LABSPEC 1.011 07/07/2019 0209   PHURINE 6.0 07/07/2019 0209   GLUCOSEU NEGATIVE 07/07/2019 0209   HGBUR NEGATIVE 07/07/2019 0209   BILIRUBINUR NEGATIVE 07/07/2019 [place] 07/07/2019 0209   PROTEINUR >=300 (A) 07/07/2019 0209   UROBILINOGEN 1.0 10/06/2011 1709   NITRITE NEGATIVE 07/07/2019 0209   LEUKOCYTESUR LARGE (A) 07/07/2019 0209     STUDIES:  Dg Chest 2 View  Result Date: 07/07/2019 CLINICAL DATA:  Edema possible compression deformity EXAM: CHEST - 2 VIEW COMPARISON:  Radiograph May 28, 2019, CT December 12, 2018 FINDINGS: Postsurgical changes related to prior CABG including intact and aligned sternotomy wires and multiple surgical clips projecting over the mediastinum. Cephalized indistinct pulmonary vascularity with interlobular septal and fissural thickening and hazy interstitial opacities in lung bases. Stable calcified granulomata are present in the lungs. There is blunting of the costophrenic sulcus which could reflect either flattening of the diaphragms or trace effusion. Multilevel degenerative changes are present in the spine. No distinct compression deformity is identified however the bones are diffusely osteopenic and superimposition of scapula a may limit detection of the upper thoracic levels. IMPRESSION: 1. Postsurgical changes related to prior CABG. 2. Findings consistent with mild to moderate pulmonary edema. 3. No distinct compression deformity is identified however osteopenia and superimposed scapula may limit detection of subtle fractures. Electronically Signed   By: Lovena Le M.D.   On: 07/07/2019 02:30   Ct L-spine No Charge  Result Date:  07/07/2019 CLINICAL DATA:  Compression fracture EXAM: CT LUMBAR SPINE WITHOUT CONTRAST TECHNIQUE: Multidetector CT imaging of the lumbar spine was performed without intravenous contrast administration. Multiplanar CT image reconstructions were also generated. COMPARISON:  Abdominal CT 01/18/2018 FINDINGS: Segmentation: 5 lumbar type vertebrae Alignment: Lower lumbar dextroscoliosis and upper lumbar levoscoliosis. No traumatic malalignment. Vertebrae: L4 superior endplate fracture that has occurred since comparison but is chronic appearing. There may have also been a left L5 transverse process fracture in the interim. No acute fracture. No evident bone lesion or erosion Paraspinal and other soft tissues: Reported separately Disc levels: T12- L1: Left-sided disc narrowing and facet spurring with moderate left foraminal stenosis. L1-L2: Mild disc narrowing and bulging.  Mild facet spurring. L2-L3: Disc narrowing and bulging with facet spurring causing mild bilateral foraminal stenosis. L3-L4: Disc narrowing and bulging.  Degenerative facet spurring that is moderate. Moderate right more than left foraminal narrowing. Moderate or advanced spinal stenosis. L4-L5: Asymmetric left-sided degenerative disc collapse and ridging. Asymmetric left facet spurring. Left foraminal impingement. Mild spinal stenosis. L5-S1:Greatest level of degenerative disc narrowing with spurring. Mild facet degeneration. The canal and foramina appear patent. IMPRESSION: 1. No acute finding. 2. L4 superior endplate fracture that has occurred since 2019 but is chronic appearing. 3. Degenerative disease with scoliosis as described. Electronically Signed   By: Monte Fantasia M.D.   On: 07/07/2019 04:55   Ct Renal Stone Study  Result Date: 07/07/2019 CLINICAL DATA:  Flank pain with stone disease suspected EXAM: CT ABDOMEN AND PELVIS WITHOUT CONTRAST TECHNIQUE: Multidetector CT imaging of the abdomen and pelvis was performed following the standard  protocol without IV contrast. COMPARISON:  02/22/2018 FINDINGS: Lower chest: Mild atelectasis at the lung bases. Trace bilateral pleural fluid. Calcified granuloma in the lingula. Hepatobiliary: No focal liver abnormality.Cholecystectomy. No common bile duct dilatation. Pancreas: Unremarkable. Spleen: Calcifications that are likely both arterial and granulomatous. Adrenals/Urinary Tract: Negative adrenals. No hydronephrosis or stone. Moderate gas within the bladder lumen, please correlate with urinalysis. Stomach/Bowel: No obstruction. No evidence of inflammation. Scattered stool without colonic over distension or rectal impaction. Distal colonic diverticulosis. Vascular/Lymphatic: No acute vascular abnormality. Widespread atherosclerotic calcification with narrow appearance of the bilateral iliacs. A left iliac stent is present. No mass or adenopathy. Reproductive:Hysterectomy with possible oophorectomies. Other: No significant ascites or pneumoperitoneum. Musculoskeletal: No acute abnormalities. Spinal degeneration with scoliosis as described on dedicated study. Advanced right hip osteoarthritis. Osteitis pubis. Generalized osteopenia. IMPRESSION: 1. No hydronephrosis or urolithiasis. 2. Moderate gas in the urinary bladder, please correlate with urinalysis. 3. Extensive atherosclerosis. Electronically Signed   By: Monte Fantasia M.D.   On: 07/07/2019 05:24     ELIGIBLE FOR AVAILABLE RESEARCH PROTOCOL: no   ASSESSMENT: 78 y.o. [place] woman with multifactorial anemia, as follows:             (a) anemia of chronic inflammation: As of May 2020 she has a SED rate of 95, CRP 1.1, positive ANA and RF; subsequently she was found to be ANCA positive             (b) hemolysis (mild): she has a mildly elevated LDH and a DAT positive for complement, not IgG; this is c/w (a) above             (c) anemia of renal insufficiency: inadequate EPO resonse to anemia              (d) GIB/ iron deficiency in patient on  lifelong anticoagulaton  (1) Feraheme received 03/01/2019, repeated 03/20/2019   (a) reticulocyte 03/07/2019 up from 43.4 a year ago to 191.8 after iron infusion  (b) feraheme repeated on 05/12/2019  (c) subsequent ferritin levels >100  (2) darbepoetin: starting 05/05/2019 at 18mcg given every 2 weeks  (a) dose increased to 200 mcg every week starting 07/01/2019   (b) back to every 2 weeks beginning with 08/04/2019 dose  (3) ANCA positive vasculitis: diagnosed 05/08/2019 (titer 1:640)  (a) prednisone 40 mg daily started 05/31/2019, on taper  (b) rituximab weekly started 06/12/2019 (received test dose 06/05/2019   (i) hepatitis B sAg, core Ab and HIV negative 05/08/2019   (ii) rituximab held after 07/14/2019 dose (received 5 doses) with neutropenia  (4) osteopenia with a T score of -1.5 on bone density 02/28/2016 at the breast center    PLAN: Raesha's hemoglobin today is the best  that she has had.  She is actually improved as far as her functional status is concerned.  I am going to drop her prednisone to 10 mg every morning.  Very likely we will go to 5 mg a month from now.  I would like to try to resume her right toxin Mab when she returns in 2 weeks.  If she can tolerate it we would do it monthly until she is completely off the prednisone and then broadening the rituximab eventually to every 6 months.  I am broadening the iron asked to every 2 weeks.  I have encouraged her to continue careful pandemic precautions and to let us know if any new developments occur..  At this point I am encouraged that we may be able to get her to turn the corner.  She knows to call for any other issue that may develop before the next visit.  [name]. Analaura Messler, MD  08/04/19 11:53 AM Medical Oncology and Hematology [facility] [place], [place] [address] Tel. [phone]    Fax. [phone]   I, Wilburn Mylar, am acting as scribe for Dr. [name].  Brick Ketcher.  I, Lurline Del MD, have reviewed the above documentation for accuracy and completeness, and I agree with the above.

## 2019-08-04 ENCOUNTER — Other Ambulatory Visit: Payer: Self-pay

## 2019-08-04 ENCOUNTER — Inpatient Hospital Stay: Payer: Medicare HMO

## 2019-08-04 ENCOUNTER — Inpatient Hospital Stay (HOSPITAL_COMMUNITY)
Admission: RE | Admit: 2019-08-04 | Discharge: 2019-08-04 | Disposition: A | Discharge status: Home / Self Care | Payer: Medicare HMO | Source: Ambulatory Visit | Attending: Cardiology | Admitting: Cardiology

## 2019-08-04 ENCOUNTER — Inpatient Hospital Stay (HOSPITAL_BASED_OUTPATIENT_CLINIC_OR_DEPARTMENT_OTHER): Payer: Medicare HMO | Admitting: Oncology

## 2019-08-04 ENCOUNTER — Ambulatory Visit: Payer: Medicare HMO

## 2019-08-04 ENCOUNTER — Ambulatory Visit: Payer: Medicare HMO | Admitting: Adult Health

## 2019-08-04 ENCOUNTER — Other Ambulatory Visit: Payer: Medicare HMO

## 2019-08-04 VITALS — BP 175/66 | HR 54 | Temp 98.3°F | Resp 18 | Ht 62.0 in | Wt 122.9 lb

## 2019-08-04 DIAGNOSIS — Z952 Presence of prosthetic heart valve: Secondary | ICD-10-CM | POA: Diagnosis not present

## 2019-08-04 DIAGNOSIS — I776 Arteritis, unspecified: Secondary | ICD-10-CM

## 2019-08-04 DIAGNOSIS — D61818 Other pancytopenia: Secondary | ICD-10-CM

## 2019-08-04 DIAGNOSIS — D594 Other nonautoimmune hemolytic anemias: Secondary | ICD-10-CM

## 2019-08-04 DIAGNOSIS — I739 Peripheral vascular disease, unspecified: Secondary | ICD-10-CM

## 2019-08-04 DIAGNOSIS — D649 Anemia, unspecified: Secondary | ICD-10-CM

## 2019-08-04 DIAGNOSIS — D701 Agranulocytosis secondary to cancer chemotherapy: Secondary | ICD-10-CM | POA: Diagnosis not present

## 2019-08-04 DIAGNOSIS — M8580 Other specified disorders of bone density and structure, unspecified site: Secondary | ICD-10-CM | POA: Diagnosis not present

## 2019-08-04 DIAGNOSIS — D696 Thrombocytopenia, unspecified: Secondary | ICD-10-CM

## 2019-08-04 DIAGNOSIS — I7782 Antineutrophilic cytoplasmic antibody (ANCA) vasculitis: Secondary | ICD-10-CM

## 2019-08-04 DIAGNOSIS — Z7984 Long term (current) use of oral hypoglycemic drugs: Secondary | ICD-10-CM | POA: Diagnosis not present

## 2019-08-04 DIAGNOSIS — Z7901 Long term (current) use of anticoagulants: Secondary | ICD-10-CM | POA: Diagnosis not present

## 2019-08-04 DIAGNOSIS — D500 Iron deficiency anemia secondary to blood loss (chronic): Secondary | ICD-10-CM

## 2019-08-04 DIAGNOSIS — D508 Other iron deficiency anemias: Secondary | ICD-10-CM

## 2019-08-04 DIAGNOSIS — D631 Anemia in chronic kidney disease: Secondary | ICD-10-CM | POA: Diagnosis not present

## 2019-08-04 DIAGNOSIS — N183 Chronic kidney disease, stage 3 unspecified: Secondary | ICD-10-CM | POA: Diagnosis not present

## 2019-08-04 DIAGNOSIS — Z5112 Encounter for antineoplastic immunotherapy: Secondary | ICD-10-CM | POA: Diagnosis not present

## 2019-08-04 LAB — CMP (CANCER CENTER ONLY)
ALT: 16 U/L (ref 0–44)
AST: 24 U/L (ref 15–41)
Albumin: 3 g/dL — ABNORMAL LOW (ref 3.5–5.0)
Alkaline Phosphatase: 86 U/L (ref 38–126)
Anion gap: 14 (ref 5–15)
BUN: 75 mg/dL — ABNORMAL HIGH (ref 8–23)
CO2: 20 mmol/L — ABNORMAL LOW (ref 22–32)
Calcium: 8.5 mg/dL — ABNORMAL LOW (ref 8.9–10.3)
Chloride: 98 mmol/L (ref 98–111)
Creatinine: 1.58 mg/dL — ABNORMAL HIGH (ref 0.44–1.00)
GFR, Est AFR Am: 35 mL/min — ABNORMAL LOW (ref >60)
GFR, Estimated: 31 mL/min — ABNORMAL LOW (ref >60)
Glucose, Bld: 228 mg/dL — ABNORMAL HIGH (ref 70–99)
Potassium: 4.3 mmol/L (ref 3.5–5.1)
Sodium: 132 mmol/L — ABNORMAL LOW (ref 135–145)
Total Bilirubin: 0.4 mg/dL (ref 0.3–1.2)
Total Protein: 5.8 g/dL — ABNORMAL LOW (ref 6.5–8.1)

## 2019-08-04 LAB — CBC WITH DIFFERENTIAL/PLATELET
Abs Immature Granulocytes: 0.18 10*3/uL — ABNORMAL HIGH (ref 0.00–0.07)
Basophils Absolute: 0.1 10*3/uL (ref 0.0–0.1)
Basophils Relative: 1 %
Eosinophils Absolute: 0.1 10*3/uL (ref 0.0–0.5)
Eosinophils Relative: 1 %
HCT: 32.9 % — ABNORMAL LOW (ref 36.0–46.0)
Hemoglobin: 10.7 g/dL — ABNORMAL LOW (ref 12.0–15.0)
Immature Granulocytes: 2 %
Lymphocytes Relative: 4 %
Lymphs Abs: 0.4 10*3/uL — ABNORMAL LOW (ref 0.7–4.0)
MCH: 28.8 pg (ref 26.0–34.0)
MCHC: 32.5 g/dL (ref 30.0–36.0)
MCV: 88.7 fL (ref 80.0–100.0)
Monocytes Absolute: 0.4 10*3/uL (ref 0.1–1.0)
Monocytes Relative: 4 %
Neutro Abs: 9 10*3/uL — ABNORMAL HIGH (ref 1.7–7.7)
Neutrophils Relative %: 88 %
Platelets: 156 10*3/uL (ref 150–400)
RBC: 3.71 MIL/uL — ABNORMAL LOW (ref 3.87–5.11)
RDW: 16.1 % — ABNORMAL HIGH (ref 11.5–15.5)
WBC: 10.2 10*3/uL (ref 4.0–10.5)
nRBC: 0 % (ref 0.0–0.2)

## 2019-08-04 LAB — IRON AND TIBC
Iron: 29 ug/dL — ABNORMAL LOW (ref 41–142)
Saturation Ratios: 10 % — ABNORMAL LOW (ref 21–57)
TIBC: 289 ug/dL (ref 236–444)
UIBC: 260 ug/dL (ref 120–384)

## 2019-08-04 LAB — RETICULOCYTES
Immature Retic Fract: 17.2 % — ABNORMAL HIGH (ref 2.3–15.9)
RBC.: 3.74 MIL/uL — ABNORMAL LOW (ref 3.87–5.11)
Retic Count, Absolute: 107.7 10*3/uL (ref 19.0–186.0)
Retic Ct Pct: 2.9 % (ref 0.4–3.1)

## 2019-08-04 LAB — LACTATE DEHYDROGENASE: LDH: 212 U/L — ABNORMAL HIGH (ref 98–192)

## 2019-08-04 LAB — SAVE SMEAR(SSMR), FOR PROVIDER SLIDE REVIEW

## 2019-08-04 LAB — FERRITIN: Ferritin: 290 ng/mL (ref 11–307)

## 2019-08-04 MED ORDER — DARBEPOETIN ALFA 200 MCG/0.4ML IJ SOSY
PREFILLED_SYRINGE | INTRAMUSCULAR | Status: AC
  Filled 2019-08-04: qty 0.4

## 2019-08-04 MED ORDER — PREDNISONE 5 MG PO TABS: 10.0000 mg | ORAL_TABLET | Freq: Every day | ORAL | 2 refills | Status: DC

## 2019-08-04 MED ORDER — DARBEPOETIN ALFA 200 MCG/0.4ML IJ SOSY
200.0000 ug | PREFILLED_SYRINGE | Freq: Once | INTRAMUSCULAR | Status: AC
  Administered 2019-08-04: 200 ug via SUBCUTANEOUS

## 2019-08-04 NOTE — Progress Notes (Signed)
Spoke to the pt via phone on 08/04/2019, but she was busy so requested a call back.  We scheduled a call tomorrow, 08/05/2019 at 1:30pm to initiate counseling services.  Art Buff [place] Counseling Intern Voicemail:  [phone]

## 2019-08-04 NOTE — Patient Instructions (Signed)
Darbepoetin Alfa injection What is this medicine? DARBEPOETIN ALFA (dar be POE e tin AL fa) helps your body make more red blood cells. It is used to treat anemia caused by chronic kidney failure and chemotherapy. This medicine may be used for other purposes; ask your health care provider or pharmacist if you have questions. COMMON BRAND NAME(S): Aranesp What should I tell my health care provider before I take this medicine? They need to know if you have any of these conditions:  blood clotting disorders or history of blood clots  cancer patient not on chemotherapy  cystic fibrosis  heart disease, such as angina, heart failure, or a history of a heart attack  hemoglobin level of 12 g/dL or greater  high blood pressure  low levels of folate, iron, or vitamin B12  seizures  an unusual or allergic reaction to darbepoetin, erythropoietin, albumin, hamster proteins, latex, other medicines, foods, dyes, or preservatives  pregnant or trying to get pregnant  breast-feeding How should I use this medicine? This medicine is for injection into a vein or under the skin. It is usually given by a health care professional in a hospital or clinic setting. If you get this medicine at home, you will be taught how to prepare and give this medicine. Use exactly as directed. Take your medicine at regular intervals. Do not take your medicine more often than directed. It is important that you put your used needles and syringes in a special sharps container. Do not put them in a trash can. If you do not have a sharps container, call your pharmacist or healthcare provider to get one. A special MedGuide will be given to you by the pharmacist with each prescription and refill. Be sure to read this information carefully each time. Talk to your pediatrician regarding the use of this medicine in children. While this medicine may be used in children as young as 1 month of age for selected conditions, precautions do  apply. Overdosage: If you think you have taken too much of this medicine contact a poison control center or emergency room at once. NOTE: This medicine is only for you. Do not share this medicine with others. What if I miss a dose? If you miss a dose, take it as soon as you can. If it is almost time for your next dose, take only that dose. Do not take double or extra doses. What may interact with this medicine? Do not take this medicine with any of the following medications:  epoetin alfa This list may not describe all possible interactions. Give your health care provider a list of all the medicines, herbs, non-prescription drugs, or dietary supplements you use. Also tell them if you smoke, drink alcohol, or use illegal drugs. Some items may interact with your medicine. What should I watch for while using this medicine? Your condition will be monitored carefully while you are receiving this medicine. You may need blood work done while you are taking this medicine. This medicine may cause a decrease in vitamin B6. You should make sure that you get enough vitamin B6 while you are taking this medicine. Discuss the foods you eat and the vitamins you take with your health care professional. What side effects may I notice from receiving this medicine? Side effects that you should report to your doctor or health care professional as soon as possible:  allergic reactions like skin rash, itching or hives, swelling of the face, lips, or tongue  breathing problems  changes in   vision  chest pain  confusion, trouble speaking or understanding  feeling faint or lightheaded, falls  high blood pressure  muscle aches or pains  pain, swelling, warmth in the leg  rapid weight gain  severe headaches  sudden numbness or weakness of the face, arm or leg  trouble walking, dizziness, loss of balance or coordination  seizures (convulsions)  swelling of the ankles, feet, hands  unusually weak or  tired Side effects that usually do not require medical attention (report to your doctor or health care professional if they continue or are bothersome):  diarrhea  fever, chills (flu-like symptoms)  headaches  nausea, vomiting  redness, stinging, or swelling at site where injected This list may not describe all possible side effects. Call your doctor for medical advice about side effects. You may report side effects to FDA at 1-800-FDA-1088. Where should I keep my medicine? Keep out of the reach of children. Store in a refrigerator between 2 and 8 degrees C (36 and 46 degrees F). Do not freeze. Do not shake. Throw away any unused portion if using a single-dose vial. Throw away any unused medicine after the expiration date. NOTE: This sheet is a summary. It may not cover all possible information. If you have questions about this medicine, talk to your doctor, pharmacist, or health care provider.  2020 [employer]vier/Gold Standard (2017-10-10 16:44:20)  

## 2019-08-05 LAB — HAPTOGLOBIN: Haptoglobin: 28 mg/dL — ABNORMAL LOW (ref 42–346)

## 2019-08-05 NOTE — Progress Notes (Signed)
Counseling Intern Notes:  Spoke to pt on 08/05/2019 via phone for a counseling intake appt. Pt reported feeling depressed as evidenced by her down mood every day for over 2 weeks, sleeping more often, no energy, no motivation to do things she used to enjoy, no ability to experience joy, no desire to be around others. Pt reported that she has felt ill since May and has been in tx since Aug. Pt reported that she used to enjoy being busy with sewing, housework, cooking, and entertaining, but currently she is unable to do any of these things. Pt is grateful that her son is able to help her out quite a bit and she has a neighbor that volunteers to help her straighten up and brings over meals on occasion.  Pt stated she recently saw progress in the fact that she now has the energy to make herself a mean at times. Pt states she wants to be able to take care of herself and do the things she enjoys. Pt stated she continues to hope for improvement in her condition, but everyday when she wakes up still feeling weak and without energy, she reacts with a down mood. Counselor provided perspective that the pt has made progress, even if it is a small step, and that together we will keep an eye on small wins, such as her ability to make her own meals from time to time. Pt stated that counseling is helpful, provides her a space to talk about her mental state and her emotional response to her illness and tx.   Next Scheduled Counseling Appt:  08/12/2019 at 1:00pm via phone  Art Buff [facility] Counseling Intern Voicemail:  [phone]

## 2019-08-12 ENCOUNTER — Ambulatory Visit (INDEPENDENT_AMBULATORY_CARE_PROVIDER_SITE_OTHER): Payer: Medicare HMO | Admitting: Pharmacist

## 2019-08-12 ENCOUNTER — Other Ambulatory Visit: Payer: Self-pay

## 2019-08-12 DIAGNOSIS — Z952 Presence of prosthetic heart valve: Secondary | ICD-10-CM | POA: Diagnosis not present

## 2019-08-12 DIAGNOSIS — Z7901 Long term (current) use of anticoagulants: Secondary | ICD-10-CM

## 2019-08-12 LAB — POCT INR: INR: 8 — AB (ref 2.0–3.0)

## 2019-08-12 LAB — PROTIME-INR
INR: >10 (ref 0.9–1.2)
Prothrombin Time: >120 s — ABNORMAL HIGH (ref 9.1–12.0)

## 2019-08-13 NOTE — Progress Notes (Signed)
Counseling Intern Notes:  Spoke to pt via phone on 08/13/2019 for a counseling appointment. Pt reported feeling a little better physically, but stated she is still struggling emotionally. Pt reported that her son took her to buy shoes yesterday since she is no longer able to wear her old shoes because of the swelling in her feet and she was "pleased" to get that done. Pt reported feeling down most days because of her inability to take care of things on her own. Pt values her independence and stated she does not like having to rely on others for grocery shopping and getting to her appointments. Pt reported she as made progress physically over the past few months and is grateful for her ability to fix herself a meal or do a small task at home. Pt accepted the strategy of noticing small achievements and finding small things to look forward to as a method for increasing positivity. Additionally, pt will find small activities at home to complete (e.g.: reading about her new air fryer or working on cleaning out a closet a little bit at a time). Pt stated she knows that her main purpose right now is to "chill and heal". Pt was not able to schedule another counseling appointment at this time, but the counseling intern will call pt next week to check-in.    Art Buff [place] Counseling Intern Voicemail:  [phone]

## 2019-08-14 ENCOUNTER — Ambulatory Visit (HOSPITAL_COMMUNITY)
Admission: RE | Admit: 2019-08-14 | Discharge: 2019-08-14 | Disposition: A | Discharge status: Home / Self Care | Payer: Medicare HMO | Source: Ambulatory Visit | Attending: Internal Medicine | Admitting: Internal Medicine

## 2019-08-14 ENCOUNTER — Other Ambulatory Visit (HOSPITAL_COMMUNITY): Payer: Self-pay | Admitting: Cardiology

## 2019-08-14 ENCOUNTER — Other Ambulatory Visit: Payer: Self-pay

## 2019-08-14 DIAGNOSIS — I5032 Chronic diastolic (congestive) heart failure: Secondary | ICD-10-CM | POA: Insufficient documentation

## 2019-08-14 LAB — BASIC METABOLIC PANEL
Anion gap: 11 (ref 5–15)
BUN: 56 mg/dL — ABNORMAL HIGH (ref 8–23)
CO2: 20 mmol/L — ABNORMAL LOW (ref 22–32)
Calcium: 8.7 mg/dL — ABNORMAL LOW (ref 8.9–10.3)
Chloride: 101 mmol/L (ref 98–111)
Creatinine, Ser: 1.53 mg/dL — ABNORMAL HIGH (ref 0.44–1.00)
GFR calc Af Amer: 37 mL/min — ABNORMAL LOW (ref >60)
GFR calc non Af Amer: 32 mL/min — ABNORMAL LOW (ref >60)
Glucose, Bld: 330 mg/dL — ABNORMAL HIGH (ref 70–99)
Potassium: 3.7 mmol/L (ref 3.5–5.1)
Sodium: 132 mmol/L — ABNORMAL LOW (ref 135–145)

## 2019-08-15 ENCOUNTER — Ambulatory Visit (INDEPENDENT_AMBULATORY_CARE_PROVIDER_SITE_OTHER): Payer: Medicare HMO | Admitting: Pharmacist

## 2019-08-15 DIAGNOSIS — Z7901 Long term (current) use of anticoagulants: Secondary | ICD-10-CM | POA: Diagnosis not present

## 2019-08-15 DIAGNOSIS — Z952 Presence of prosthetic heart valve: Secondary | ICD-10-CM | POA: Diagnosis not present

## 2019-08-15 LAB — POCT INR: INR: 1.6 — AB (ref 2.0–3.0)

## 2019-08-16 NOTE — Progress Notes (Signed)
[facility]  [phone] [phone]) [phone]    ID: [id number] [name] DOB: 07-Oct-1941  MR#: [id number]  IRS#:[id number]  Patient Care Team: [name], MD as PCP - General (Internal Medicine) [name] Dresser, MD as PCP - Advanced Heart Failure (Cardiology) [name] Harp, MD as PCP - Cardiology (Cardiology) [name], [name] Dad, MD as Consulting Physician (Hematology and Oncology) [name] Silence, MD as Consulting Physician (Gastroenterology) [name] Dresser, MD as Consulting Physician (Cardiology) Marzetta Board, DPM as Consulting Physician (Podiatry) [name] Pound, MD as Consulting Physician (Rheumatology) [name], [facility] as Mercer [name] (Pharmacist) [name] Billings, RN as [name] OTHER MD:   CHIEF COMPLAINT: anemia and thrombocytopenia  CURRENT TREATMENT: [Feraheme], Aranesp, prednisone, rituximab   INTERVAL HISTORY: Tangee returns today for follow-up and treatment of her multifactorial anemia and more recent  Leukopenia.  She continues on aranesp for her anemia of renal insufficiency, currently at 200 mcg every 14 days. She is due for a dose today.    She started rituximab 05/30/2019 (test dose) and received 5 weekly doses, last 07/14/2019.  On 07/21/2019 she was found tobe severely leukopenic and rituxan was held. She received prophylactic cipro x 6 days and growth factor as well.. Repeat WBC 08/04/2019 was 10.2.  Her white cell count remains in the normal range currently.  Accordingly she is scheduled for another rituximab dose today.  We are tapering her prednisone, currently at 10 mg/d.  She had an episode of thrush at higher doses but currently has no symptoms related to this.  Her last blood transfusion was 05/27/2019. Her Hb has been trending upwards Lab Results  Component Value Date   HGB 10.1 (L) 08/18/2019   HGB 10.7 (L) 08/04/2019   HGB 10.1 (L) 07/28/2019   HGB 9.2 (L) 07/21/2019   HGB 9.4 (L) 07/14/2019   Her ferritin remains >100 after her last feraheme dose, 05/12/2019: Lab Results  Component Value Date   FERRITIN 290 08/04/2019   FERRITIN 167 07/28/2019   FERRITIN 208 07/21/2019   FERRITIN 134 07/14/2019   FERRITIN 147 07/08/2019    Her reticulocyte counts remain very variable Results for LERAE, LANGHAM (MRN [id number]) as of 08/03/2019 19:18  Ref. Range 03/20/2019 10:03 04/21/2019 10:28 04/28/2019 14:15 05/05/2019 11:10 05/12/2019 12:16 05/19/2019 11:52 06/05/2019 09:47 06/12/2019 08:54 06/20/2019 08:53 07/01/2019 11:33 07/08/2019 08:38 07/14/2019 09:03 07/21/2019 09:05 07/28/2019 08:46  Retic Count, Absolute Latest Ref Range: 19.0 - 186.0 K/uL 94.7 60.3 54.5 63.4 92.9 208.5 (H) 110.5 54.8 100.1 80.8 65.9 113.7 76.9 93.4    REVIEW OF SYSTEMS: Sinahi still feels tired but overall feels considerably better than she did originally.  She has been transfusion free now for nearly 3 months.  She has had no fevers, unusual headaches, visual changes, cough, or phlegm production.  She did have a very high INR and her Coumadin was held for a while.  That has been corrected.  A detailed review of systems today was otherwise stable   HISTORY OF CURRENT ILLNESS: From the original consult note:  [name] is an 78 y.o. female from [place], [place].  She has a past medical history significant forPAD s/p stenting; moderate to severe pulmonary HTN; afib and AVR on Coumadin; HTN; HLD; DM; and CAD s/p CABG. the patient was admitted to the hospital for anemia.  She was having increasing fatigue and shortness of breath and had routine lab work drawn which showed a hemoglobin of 6.6.  Due to her abnormal lab work, she was advised to come to the emergency room for admission.  She was also admitted in February 2020 for symptomatic anemia.  She reports she has been anemic for at least a year and has been taking oral iron.  She underwent a colonoscopy and EGD on  01/22/2018.  The colonoscopy showed diverticulosis in the sigmoid colon and descending colon, internal hemorrhoids.  The upper endoscopy was normal except for erythematous mucosa in the antrum.  She had a repeat EGD on 11/21/2018 that showed a few minimally oozing( with scope trauma) superficial gastric ulcers with pigmented material were found in the gastric body. The largest lesion was 4 mm in largest dimension.  She underwent another upper endoscopy on 02/27/2019 which showed patchy mildly friable mucosa with contact bleeding was found in the gastric fundus, in the gastric body and in the gastric antrum.  The patient is maintained on warfarin due to A. fib and a mechanical aortic valve.  Review of the patient's chart shows that she has been anemic dating back to at least December thousand 12 (these are the earliest labs available to me).  Her highest hemoglobin was 11.6 in December 2012.  I also note that the patient has had intermittent mild leukopenia adding back to at least April 2019.  Her lowest white blood cell count was 2.6 in April 2019.  Since admission, the patient has received 1 unit of packed red blood cells.  She is currently on heparin for her procedure with plan to bridge back to Coumadin.  Stool for occult blood was negative x1 on 02/24/2019.  Hematology was asked see the patient to make recommendations regarding her anemia.  [name] subsequent history is as detailed below.   PAST MEDICAL HISTORY: Past Medical History:  Diagnosis Date   Aortic atherosclerosis ([facility]) 01/23/2018   Atrial fibrillation ([facility])    Carotid artery disease ([facility])    Cholelithiasis 01/23/2018   Chronic anticoagulation    Coronary artery disease    status post coronary artery bypass grafting times 07/10/2004   Diabetes mellitus    Hypercholesteremia    Hypertension    Mechanical heart valve present    H. aortic valve replacement at the time of bypass surgery October 2005   Moderate to severe  pulmonary hypertension ([facility])    Peripheral arterial disease ([facility])    history of left common iliac artery PTA and stenting for a chronic total occlusion 08/26/01     PAST SURGICAL HISTORY: Past Surgical History:  Procedure Laterality Date   AORTIC VALVE REPLACEMENT     CARDIAC CATHETERIZATION  11/10/2004   40% right common illiac, 70% in stent restenosis of distal left common illiac,    CARDIAC CATHETERIZATION  05/18/2004   LAD 50-70% midstenosis, RCA dominant w/50% stenosis, 50% Right common Illiac artery ostial stenosis, 90% in stent restenosis within midportion of left common illiac stent   Carotid Duplex  03/12/2012   RSA-elev. velocities suggestive of a 50-69% diameter reduction, Right&Left Bulb/Prox ICA-mild-mod.fibrous plaqueelevating Velocities abnormal study.   CHOLECYSTECTOMY N/A 03/01/2018   Procedure: LAPAROSCOPIC CHOLECYSTECTOMY;  Surgeon: Judeth Horn, MD;  Location: [facility];  Service: General;  Laterality: N/A;   COLONOSCOPY WITH PROPOFOL N/A 01/22/2018   Procedure: COLONOSCOPY WITH PROPOFOL;  Surgeon: Wilford Corner, MD;  Location: [facility];  Service: Endoscopy;  Laterality: N/A;   ESOPHAGOGASTRODUODENOSCOPY (EGD) WITH PROPOFOL N/A 01/22/2018   Procedure: ESOPHAGOGASTRODUODENOSCOPY (EGD) WITH PROPOFOL;  Surgeon: Wilford Corner, MD;  Location: [facility];  Service:  Endoscopy;  Laterality: N/A;   ESOPHAGOGASTRODUODENOSCOPY (EGD) WITH PROPOFOL N/A 11/21/2018   Procedure: ESOPHAGOGASTRODUODENOSCOPY (EGD) WITH PROPOFOL;  Surgeon: [name], MD;  Location: [facility];  Service: Gastroenterology;  Laterality: N/A;   ESOPHAGOGASTRODUODENOSCOPY (EGD) WITH PROPOFOL Left 02/27/2019   Procedure: ESOPHAGOGASTRODUODENOSCOPY (EGD) WITH PROPOFOL;  Surgeon: [name] Silence, MD;  Location: [facility];  Service: Endoscopy;  Laterality: Left;   GIVENS CAPSULE STUDY N/A 11/21/2018   Procedure: GIVENS CAPSULE STUDY;  Surgeon: [name], MD;  Location: [facility];   Service: Gastroenterology;  Laterality: N/A;  To be deployed during EGD   Lower Ext. Duplex  03/12/2012   Right Proximal CIA- vessel narrowing w/elevated velocities 0-49% diameter reduction. Right SFA-mild mixed density plaque throughout vessel.   NM MYOCAR PERF WALL MOTION  05/19/2010   protocol: Persantine, post stress EF 65%, negative for ischemia, low risk scan   RIGHT HEART CATH N/A 06/27/2018   Procedure: RIGHT HEART CATH;  Surgeon: [name] Dresser, MD;  Location: [facility];  Service: Cardiovascular;  Laterality: N/A;   TRANSTHORACIC ECHOCARDIOGRAM  08/29/2012   Moderately calcified annulus of mitral valve, moderate regurg. of both mitral valve and tricuspid valve.     FAMILY HISTORY: Family History  Problem Relation Age of Onset   Breast cancer Neg Hx     GYNECOLOGIC HISTORY:  No LMP recorded. Patient has had a hysterectomy. Menarche:  years old Age at first live birth:  [place] P: 3 LMP:  Contraceptive:  HRT:   Hysterectomy?: yes BSO?:    SOCIAL HISTORY: (Current as of 03/07/2019) Ethyle is widowed.  She lives by herself.  She has 3 children that live locally.  She smoked three quarters of a pack of cigarettes daily for 50 years.  She quit in 2005.  Denies alcohol use.   ADVANCED DIRECTIVES: Not in place    HEALTH MAINTENANCE: Social History   Tobacco Use   Smoking status: Former Smoker    Packs/day: 1.00    Years: 30.00    Pack years: 30.00    Types: Cigarettes   Smokeless tobacco: Never Used   Tobacco comment: quit smoking 2005  Substance Use Topics   Alcohol use: No    Alcohol/week: 0.0 standard drinks   Drug use: No    Colonoscopy: EGD on 02/27/2019 under Dr. Paulita Fujita found some friable gastric mucosa.  Colonoscopy under Dr. Michail Sermon 01/22/2018 showed significant diverticular disease  PAP:   Bone density:  Mammography: 02/25/2017, breast density category B.  Allergies  Allergen Reactions   Flagyl [Metronidazole] Rash    ALL-OVER BODY  RASH   Coreg [Carvedilol] Other (See Comments)    Terrible cramping in the feet and had a lot of bowel movements, but not diarrhea   Losartan Swelling    Patient doesn't recall site of swelling   Verapamil Hives   Zetia [Ezetimibe] Other (See Comments)    Reaction not recalled   Zocor [Simvastatin - High Dose] Other (See Comments)    Reaction not recalled    Current Outpatient Medications  Medication Sig Dispense Refill   acetaminophen (TYLENOL) 325 MG tablet Take 650 mg by mouth every 6 (six) hours as needed for mild pain.      allopurinol (ZYLOPRIM) 100 MG tablet Take 100 mg by mouth daily.     ALPRAZolam (XANAX) 0.5 MG tablet Take 0.25 mg by mouth at bedtime.      atorvastatin (LIPITOR) 80 MG tablet Take 1 tablet (80 mg total) by mouth daily at 6 PM.  Cholecalciferol (VITAMIN D3) 1000 UNITS CAPS Take 2,000 Units by mouth daily with lunch.      cloNIDine (CATAPRES) 0.2 MG tablet Take 1 tablet (0.2 mg total) by mouth 2 (two) times daily. 60 tablet 3   Cyanocobalamin (VITAMIN B12) 1000 MCG TBCR Take 1,000 mcg by mouth daily with lunch.      feeding supplement, GLUCERNA SHAKE, (GLUCERNA SHAKE) LIQD Take 237 mLs by mouth 2 (two) times daily between meals. 237 mL 30   fluconazole (DIFLUCAN) 200 MG tablet Take 1 tablet (200 mg total) by mouth daily. 5 tablet 2   furosemide (LASIX) 40 MG tablet Take 2 tablets (80 mg total) by mouth every morning AND 1 tablet (40 mg total) every evening. 90 tablet 3   hydrALAZINE (APRESOLINE) 100 MG tablet Take 1 tablet (100 mg total) by mouth 3 (three) times daily. 270 tablet 1   Magnesium 500 MG TABS Take 500 mg by mouth daily.     magnesium oxide (MAG-OX) 400 (241.3 Mg) MG tablet Take 1 tablet (400 mg total) by mouth daily.     metFORMIN (GLUCOPHAGE) 500 MG tablet Take 500 mg by mouth 2 (two) times daily with a meal.      metoprolol tartrate (LOPRESSOR) 25 MG tablet Take 0.5 tablets (12.5 mg total) by mouth 2 (two) times daily. 60  tablet 0   ondansetron (ZOFRAN ODT) 4 MG disintegrating tablet Take 1 tablet (4 mg total) by mouth every 8 (eight) hours as needed for nausea or vomiting. 6 tablet 0   pantoprazole (PROTONIX) 40 MG tablet Take 40 mg by mouth 2 (two) times daily.      predniSONE (DELTASONE) 5 MG tablet Take 2 tablets (10 mg total) by mouth daily with breakfast. 90 tablet 2   spironolactone (ALDACTONE) 25 MG tablet Take 1 tablet (25 mg total) by mouth daily. 90 tablet 3   tadalafil, PAH, (ADCIRCA) 20 MG tablet Take 2 tablets (40 mg total) by mouth daily. 60 tablet 6   traMADol (ULTRAM) 50 MG tablet Take 1 tablet (50 mg total) by mouth every 6 (six) hours as needed. 15 tablet 0   vitamin C (ASCORBIC ACID) 500 MG tablet Take 500 mg by mouth daily with lunch.     warfarin (COUMADIN) 5 MG tablet Take 5-7.5 mg by mouth daily. Take 1.5 tablets (7.5 mg) on Tues, Thursday, Saturday and Sunday. Take 1 tablet (5mg ) on Mondays, Wednesdays, and Fridays.     No current facility-administered medications for this visit.      OBJECTIVE: older white woman examined in a wheelchair  Vitals:   08/18/19 1055  BP: (!) 154/80  Pulse: (!) 59  Resp: 18  Temp: 98.2 F (36.8 C)  SpO2: 99%   Wt Readings from Last 3 Encounters:  08/18/19 124 lb 4.8 oz (56.4 kg)  08/04/19 122 lb 14.4 oz (55.7 kg)  07/28/19 125 lb 4.8 oz (56.8 kg)   Body mass index is 22.73 kg/m.    ECOG FS:2 - Symptomatic, <50% confined to bed  Sclerae unicteric, EOMs intact Wearing a mask No cervical or supraclavicular adenopathy Lungs no rales or rhonchi Heart regular rate and rhythm Abd soft, nontender, positive bowel sounds MSK no focal spinal tenderness, no upper extremity lymphedema Neuro: nonfocal, well oriented, appropriate affect Breasts: Deferred   LAB RESULTS:  CMP     Component Value Date/Time   NA 132 (L) 08/14/2019 1115   NA 137 12/06/2017 1436   K 3.7 08/14/2019 1115   CL 101 08/14/2019  1115   CO2 20 (L) 08/14/2019 1115     GLUCOSE 330 (H) 08/14/2019 1115   BUN 56 (H) 08/14/2019 1115   BUN 20 12/06/2017 1436   CREATININE 1.53 (H) 08/14/2019 1115   CREATININE 1.58 (H) 08/04/2019 1112   CALCIUM 8.7 (L) 08/14/2019 1115   PROT 5.8 (L) 08/04/2019 1112   ALBUMIN 3.0 (L) 08/04/2019 1112   AST 24 08/04/2019 1112   ALT 16 08/04/2019 1112   ALKPHOS 86 08/04/2019 1112   BILITOT 0.4 08/04/2019 1112   GFRNONAA 32 (L) 08/14/2019 1115   GFRNONAA 31 (L) 08/04/2019 1112   GFRAA 37 (L) 08/14/2019 1115   GFRAA 35 (L) 08/04/2019 1112    Lab Results  Component Value Date   TOTALPROTELP 6.9 05/07/2019    No results found for: KPAFRELGTCHN, LAMBDASER, [facility]  Lab Results  Component Value Date   WBC 6.2 08/18/2019   NEUTROABS 5.3 08/18/2019   HGB 10.1 (L) 08/18/2019   HCT 31.8 (L) 08/18/2019   MCV 87.8 08/18/2019   PLT 206 08/18/2019    No results found for: LABCA2  No components found for: [id number]  Recent Labs  Lab 08/15/19 1114  INR 1.6*    No results found for: LABCA2  No results found for: QVZ563  No results found for: OVF643  No results found for: PIR518  No results found for: CA2729  No components found for: HGQUANT  No results found for: CEA1 / No results found for: CEA1   No results found for: AFPTUMOR  No results found for: CHROMOGRNA  No results found for: PSA1  Appointment on 08/18/2019  Component Date Value Ref Range Status   WBC 08/18/2019 6.2  4.0 - 10.5 K/uL Final   RBC 08/18/2019 3.62* 3.87 - 5.11 MIL/uL Final   Hemoglobin 08/18/2019 10.1* 12.0 - 15.0 g/dL Final   HCT 08/18/2019 31.8* 36.0 - 46.0 % Final   MCV 08/18/2019 87.8  80.0 - 100.0 fL Final   MCH 08/18/2019 27.9  26.0 - 34.0 pg Final   MCHC 08/18/2019 31.8  30.0 - 36.0 g/dL Final   RDW 08/18/2019 16.4* 11.5 - 15.5 % Final   Platelets 08/18/2019 206  150 - 400 K/uL Final   nRBC 08/18/2019 0.0  0.0 - 0.2 % Final   Neutrophils Relative % 08/18/2019 84  % Final   Neutro Abs 08/18/2019 5.3   1.7 - 7.7 K/uL Final   Lymphocytes Relative 08/18/2019 8  % Final   Lymphs Abs 08/18/2019 0.5* 0.7 - 4.0 K/uL Final   Monocytes Relative 08/18/2019 4  % Final   Monocytes Absolute 08/18/2019 0.2  0.1 - 1.0 K/uL Final   Eosinophils Relative 08/18/2019 2  % Final   Eosinophils Absolute 08/18/2019 0.1  0.0 - 0.5 K/uL Final   Basophils Relative 08/18/2019 1  % Final   Basophils Absolute 08/18/2019 0.1  0.0 - 0.1 K/uL Final   Immature Granulocytes 08/18/2019 1  % Final   Abs Immature Granulocytes 08/18/2019 0.03  0.00 - 0.07 K/uL Final   Performed at [facility], [place] [address], [place], [place] [address]   Retic Ct Pct 08/18/2019 2.5  0.4 - 3.1 % Final   RBC. 08/18/2019 3.62* 3.87 - 5.11 MIL/uL Final   Retic Count, Absolute 08/18/2019 89.4  19.0 - 186.0 K/uL Final   Immature Retic Fract 08/18/2019 19.4* 2.3 - 15.9 % Final   Performed at [facility], [place] [address], [place], [place] [address]  Smear Review 08/18/2019 SMEAR STAINED AND AVAILABLE FOR REVIEW   Final   Performed at [facility], [address] [place], [place] [address]    (this displays the last labs from the last 3 days)  Lab Results  Component Value Date   TOTALPROTELP 6.9 05/07/2019   (this displays SPEP labs)  No results found for: KPAFRELGTCHN, LAMBDASER, KAPLAMBRATIO (kappa/lambda light chains)  No results found for: HGBA, HGBA2QUANT, HGBFQUANT, HGBSQUAN (Hemoglobinopathy evaluation)   Lab Results  Component Value Date   LDH 212 (H) 08/04/2019    Lab Results  Component Value Date   IRON 29 (L) 08/04/2019   TIBC 289 08/04/2019   IRONPCTSAT 10 (L) 08/04/2019   (Iron and TIBC)  Lab Results  Component Value Date   FERRITIN 290 08/04/2019    Urinalysis    Component Value Date/Time   COLORURINE YELLOW 07/07/2019 0209   APPEARANCEUR CLOUDY (A) 07/07/2019 0209   LABSPEC 1.011 07/07/2019 0209   PHURINE 6.0  07/07/2019 0209   GLUCOSEU NEGATIVE 07/07/2019 0209   HGBUR NEGATIVE 07/07/2019 0209   BILIRUBINUR NEGATIVE 07/07/2019 0209   KETONESUR NEGATIVE 07/07/2019 0209   PROTEINUR >=300 (A) 07/07/2019 0209   UROBILINOGEN 1.0 10/06/2011 1709   NITRITE NEGATIVE 07/07/2019 0209   LEUKOCYTESUR LARGE (A) 07/07/2019 0209     STUDIES:  No results found.   ELIGIBLE FOR AVAILABLE RESEARCH PROTOCOL: no   ASSESSMENT: 78 y.o. [place] woman with multifactorial anemia, as follows:             (a) anemia of chronic inflammation: As of May 2020 she has a SED rate of 95, CRP 1.1, positive ANA and RF; subsequently she was found to be ANCA positive             (b) hemolysis (mild): she has a mildly elevated LDH and a DAT positive for complement, not IgG; this is c/w (a) above             (c) anemia of renal insufficiency: inadequate EPO resonse to anemia              (d) GIB/ iron deficiency in patient on lifelong anticoagulaton  (1) Feraheme received 03/01/2019, repeated 03/20/2019   (a) reticulocyte 03/07/2019 up from 43.4 a year ago to 191.8 after iron infusion  (b) feraheme repeated on 05/12/2019  (c) subsequent ferritin levels >100  (2) darbepoetin: starting 05/05/2019 at 1100mcg given every 2 weeks  (a) dose increased to 200 mcg every week starting 07/01/2019   (b) back to every 2 weeks beginning with 08/04/2019 dose  (3) ANCA positive vasculitis: diagnosed 05/08/2019 (titer 1:640)  (a) prednisone 40 mg daily started 05/31/2019, on taper  (b) rituximab weekly started 06/12/2019 (received test dose 06/05/2019   (i) hepatitis B sAg, core Ab and HIV negative 05/08/2019   (ii) rituximab held after 07/14/2019 dose (received 5 doses) with neutropenia   (iii) rituximab resumed 08/18/2019     (4) prednisone taper ongoing:   (a) osteopenia with a T score of -1.5 on bone density 02/28/2016 at the breast center    PLAN: Prisha's hemoglobin is now at goal, which is between 10 and 11.  She looks  better and feels better although she still feels tired she says.  Compared to how she felt 3 months ago "I am a lot better".  I would agree with  We are trying to decrease her immune suppression and treatment in general to the minimum required to keep her hemoglobin between  10 and 11.  We are going to continue of the Aranesp every 2 weeks at the current dose.  I am dropping the prednisone to 5 mg daily today and when she returns to see me in a month we may consider going off prednisone altogether versus going to 5 mg every other day.  We are going to try right toxin again today.  I think that is what really is helping the most.  She did have an episode of neutropenia which I do believe was related to the rituximab.  That resolved quickly.  There is no firm guidance from the literature on whether or not to resume Rituxan in these cases.  Decisions have to be made on a case-by-case basis and in her case I think the potential benefits from rituximab outweigh the short-term risk.  She will return in 2 weeks just for a shot and then in 4 weeks for a shot and rituximab.  She knows to call for any other issue that may develop before her next visit here.  [name]. Joya Willmott, MD  08/18/19 11:20 AM Medical Oncology and Hematology [facility] [place], [place] [address] Tel. [phone]    Fax. [phone]   I, Wilburn Mylar, am acting as scribe for Dr. Virgie [name]. Alya Smaltz.  I, Lurline Del MD, have reviewed the above documentation for accuracy and completeness, and I agree with the above.

## 2019-08-18 ENCOUNTER — Inpatient Hospital Stay: Payer: Medicare HMO

## 2019-08-18 ENCOUNTER — Other Ambulatory Visit: Payer: Self-pay

## 2019-08-18 ENCOUNTER — Inpatient Hospital Stay: Payer: Medicare HMO | Attending: Oncology

## 2019-08-18 ENCOUNTER — Inpatient Hospital Stay (HOSPITAL_BASED_OUTPATIENT_CLINIC_OR_DEPARTMENT_OTHER): Payer: Medicare HMO | Admitting: Oncology

## 2019-08-18 VITALS — BP 154/80 | HR 59 | Temp 98.2°F | Resp 18 | Ht 62.0 in | Wt 124.3 lb

## 2019-08-18 VITALS — BP 165/51 | HR 59 | Temp 97.9°F | Resp 18

## 2019-08-18 DIAGNOSIS — D508 Other iron deficiency anemias: Secondary | ICD-10-CM | POA: Diagnosis not present

## 2019-08-18 DIAGNOSIS — M8580 Other specified disorders of bone density and structure, unspecified site: Secondary | ICD-10-CM | POA: Insufficient documentation

## 2019-08-18 DIAGNOSIS — D701 Agranulocytosis secondary to cancer chemotherapy: Secondary | ICD-10-CM | POA: Diagnosis not present

## 2019-08-18 DIAGNOSIS — D631 Anemia in chronic kidney disease: Secondary | ICD-10-CM | POA: Insufficient documentation

## 2019-08-18 DIAGNOSIS — I776 Arteritis, unspecified: Secondary | ICD-10-CM

## 2019-08-18 DIAGNOSIS — D696 Thrombocytopenia, unspecified: Secondary | ICD-10-CM | POA: Diagnosis not present

## 2019-08-18 DIAGNOSIS — T451X5A Adverse effect of antineoplastic and immunosuppressive drugs, initial encounter: Secondary | ICD-10-CM

## 2019-08-18 DIAGNOSIS — I480 Paroxysmal atrial fibrillation: Secondary | ICD-10-CM

## 2019-08-18 DIAGNOSIS — N289 Disorder of kidney and ureter, unspecified: Secondary | ICD-10-CM

## 2019-08-18 DIAGNOSIS — D61818 Other pancytopenia: Secondary | ICD-10-CM

## 2019-08-18 DIAGNOSIS — I739 Peripheral vascular disease, unspecified: Secondary | ICD-10-CM | POA: Diagnosis not present

## 2019-08-18 DIAGNOSIS — D500 Iron deficiency anemia secondary to blood loss (chronic): Secondary | ICD-10-CM

## 2019-08-18 DIAGNOSIS — R7402 Elevation of levels of lactic acid dehydrogenase (LDH): Secondary | ICD-10-CM | POA: Insufficient documentation

## 2019-08-18 DIAGNOSIS — N189 Chronic kidney disease, unspecified: Secondary | ICD-10-CM | POA: Insufficient documentation

## 2019-08-18 DIAGNOSIS — I7782 Antineutrophilic cytoplasmic antibody (ANCA) vasculitis: Secondary | ICD-10-CM

## 2019-08-18 DIAGNOSIS — Z952 Presence of prosthetic heart valve: Secondary | ICD-10-CM | POA: Diagnosis not present

## 2019-08-18 DIAGNOSIS — Z5112 Encounter for antineoplastic immunotherapy: Secondary | ICD-10-CM | POA: Diagnosis not present

## 2019-08-18 DIAGNOSIS — D594 Other nonautoimmune hemolytic anemias: Secondary | ICD-10-CM

## 2019-08-18 LAB — CBC WITH DIFFERENTIAL/PLATELET
Abs Immature Granulocytes: 0.03 10*3/uL (ref 0.00–0.07)
Basophils Absolute: 0.1 10*3/uL (ref 0.0–0.1)
Basophils Relative: 1 %
Eosinophils Absolute: 0.1 10*3/uL (ref 0.0–0.5)
Eosinophils Relative: 2 %
HCT: 31.8 % — ABNORMAL LOW (ref 36.0–46.0)
Hemoglobin: 10.1 g/dL — ABNORMAL LOW (ref 12.0–15.0)
Immature Granulocytes: 1 %
Lymphocytes Relative: 8 %
Lymphs Abs: 0.5 10*3/uL — ABNORMAL LOW (ref 0.7–4.0)
MCH: 27.9 pg (ref 26.0–34.0)
MCHC: 31.8 g/dL (ref 30.0–36.0)
MCV: 87.8 fL (ref 80.0–100.0)
Monocytes Absolute: 0.2 10*3/uL (ref 0.1–1.0)
Monocytes Relative: 4 %
Neutro Abs: 5.3 10*3/uL (ref 1.7–7.7)
Neutrophils Relative %: 84 %
Platelets: 206 10*3/uL (ref 150–400)
RBC: 3.62 MIL/uL — ABNORMAL LOW (ref 3.87–5.11)
RDW: 16.4 % — ABNORMAL HIGH (ref 11.5–15.5)
WBC: 6.2 10*3/uL (ref 4.0–10.5)
nRBC: 0 % (ref 0.0–0.2)

## 2019-08-18 LAB — CMP (CANCER CENTER ONLY)
ALT: 16 U/L (ref 0–44)
AST: 26 U/L (ref 15–41)
Albumin: 3.1 g/dL — ABNORMAL LOW (ref 3.5–5.0)
Alkaline Phosphatase: 69 U/L (ref 38–126)
Anion gap: 12 (ref 5–15)
BUN: 66 mg/dL — ABNORMAL HIGH (ref 8–23)
CO2: 22 mmol/L (ref 22–32)
Calcium: 8.4 mg/dL — ABNORMAL LOW (ref 8.9–10.3)
Chloride: 98 mmol/L (ref 98–111)
Creatinine: 1.68 mg/dL — ABNORMAL HIGH (ref 0.44–1.00)
GFR, Est AFR Am: 33 mL/min — ABNORMAL LOW (ref >60)
GFR, Estimated: 28 mL/min — ABNORMAL LOW (ref >60)
Glucose, Bld: 351 mg/dL — ABNORMAL HIGH (ref 70–99)
Potassium: 4.5 mmol/L (ref 3.5–5.1)
Sodium: 132 mmol/L — ABNORMAL LOW (ref 135–145)
Total Bilirubin: 0.4 mg/dL (ref 0.3–1.2)
Total Protein: 5.9 g/dL — ABNORMAL LOW (ref 6.5–8.1)

## 2019-08-18 LAB — RETICULOCYTES
Immature Retic Fract: 19.4 % — ABNORMAL HIGH (ref 2.3–15.9)
RBC.: 3.62 MIL/uL — ABNORMAL LOW (ref 3.87–5.11)
Retic Count, Absolute: 89.4 10*3/uL (ref 19.0–186.0)
Retic Ct Pct: 2.5 % (ref 0.4–3.1)

## 2019-08-18 LAB — SAVE SMEAR(SSMR), FOR PROVIDER SLIDE REVIEW

## 2019-08-18 LAB — FERRITIN: Ferritin: 266 ng/mL (ref 11–307)

## 2019-08-18 LAB — IRON AND TIBC
Iron: 23 ug/dL — ABNORMAL LOW (ref 41–142)
Saturation Ratios: 9 % — ABNORMAL LOW (ref 21–57)
TIBC: 265 ug/dL (ref 236–444)
UIBC: 242 ug/dL (ref 120–384)

## 2019-08-18 MED ORDER — DIPHENHYDRAMINE HCL 25 MG PO CAPS
25.0000 mg | ORAL_CAPSULE | Freq: Once | ORAL | Status: AC
  Administered 2019-08-18: 25 mg via ORAL

## 2019-08-18 MED ORDER — DIPHENHYDRAMINE HCL 25 MG PO CAPS
ORAL_CAPSULE | ORAL | Status: AC
  Filled 2019-08-18: qty 1

## 2019-08-18 MED ORDER — EPOETIN ALFA-EPBX 40000 UNIT/ML IJ SOLN
INTRAMUSCULAR | Status: AC
  Filled 2019-08-18: qty 1

## 2019-08-18 MED ORDER — DARBEPOETIN ALFA 200 MCG/0.4ML IJ SOSY: 200.0000 ug | PREFILLED_SYRINGE | Freq: Once | INTRAMUSCULAR | Status: DC

## 2019-08-18 MED ORDER — EPOETIN ALFA-EPBX 40000 UNIT/ML IJ SOLN
40000.0000 [IU] | Freq: Once | INTRAMUSCULAR | Status: AC
  Administered 2019-08-18: 40000 [IU] via SUBCUTANEOUS

## 2019-08-18 MED ORDER — ACETAMINOPHEN 325 MG PO TABS
650.0000 mg | ORAL_TABLET | Freq: Once | ORAL | Status: AC
  Administered 2019-08-18: 650 mg via ORAL

## 2019-08-18 MED ORDER — SODIUM CHLORIDE 0.9 % IV SOLN
Freq: Once | INTRAVENOUS | Status: AC
  Administered 2019-08-18: 12:00:00 via INTRAVENOUS
  Filled 2019-08-18: qty 250

## 2019-08-18 MED ORDER — SODIUM CHLORIDE 0.9 % IV SOLN
375.0000 mg/m2 | Freq: Once | INTRAVENOUS | Status: AC
  Administered 2019-08-18: 600 mg via INTRAVENOUS
  Filled 2019-08-18: qty 60
  Filled 2019-08-18: qty 50
  Filled 2019-08-18 (×2): qty 60

## 2019-08-18 MED ORDER — ACETAMINOPHEN 325 MG PO TABS
ORAL_TABLET | ORAL | Status: AC
  Filled 2019-08-18: qty 2

## 2019-08-18 MED ORDER — DARBEPOETIN ALFA 200 MCG/0.4ML IJ SOSY
PREFILLED_SYRINGE | INTRAMUSCULAR | Status: AC
  Filled 2019-08-18: qty 0.4

## 2019-08-18 NOTE — Addendum Note (Signed)
Addended by: Chauncey Cruel on: 08/18/2019 01:22 PM   Modules accepted: Orders

## 2019-08-18 NOTE — Patient Instructions (Signed)
[facility] Discharge Instructions for Patients Receiving Chemotherapy  Today you received the following chemotherapy agents Rituximab (RITUXAN).  To help prevent nausea and vomiting after your treatment, we encourage you to take your nausea medication as prescribed.   If you develop nausea and vomiting that is not controlled by your nausea medication, call the clinic.   BELOW ARE SYMPTOMS THAT SHOULD BE REPORTED IMMEDIATELY:  *FEVER GREATER THAN 100.5 F  *CHILLS WITH OR WITHOUT FEVER  NAUSEA AND VOMITING THAT IS NOT CONTROLLED WITH YOUR NAUSEA MEDICATION  *UNUSUAL SHORTNESS OF BREATH  *UNUSUAL BRUISING OR BLEEDING  TENDERNESS IN MOUTH AND THROAT WITH OR WITHOUT PRESENCE OF ULCERS  *URINARY PROBLEMS  *BOWEL PROBLEMS  UNUSUAL RASH Items with * indicate a potential emergency and should be followed up as soon as possible.  Feel free to call the clinic should you have any questions or concerns. The clinic phone number is [phone].  Please show the [place] at check-in to the Emergency Department and triage nurse.  Darbepoetin Alfa injection What is this medicine? DARBEPOETIN ALFA (dar be POE e tin AL fa) helps your body make more red blood cells. It is used to treat anemia caused by chronic kidney failure and chemotherapy. This medicine may be used for other purposes; ask your health care provider or pharmacist if you have questions. COMMON BRAND NAME(S): Aranesp What should I tell my health care provider before I take this medicine? They need to know if you have any of these conditions:  blood clotting disorders or history of blood clots  cancer patient not on chemotherapy  cystic fibrosis  heart disease, such as angina, heart failure, or a history of a heart attack  hemoglobin level of 12 g/dL or greater  high blood pressure  low levels of folate, iron, or vitamin B12  seizures  an unusual or allergic reaction to darbepoetin,  erythropoietin, albumin, hamster proteins, latex, other medicines, foods, dyes, or preservatives  pregnant or trying to get pregnant  breast-feeding How should I use this medicine? This medicine is for injection into a vein or under the skin. It is usually given by a health care professional in a hospital or clinic setting. If you get this medicine at home, you will be taught how to prepare and give this medicine. Use exactly as directed. Take your medicine at regular intervals. Do not take your medicine more often than directed. It is important that you put your used needles and syringes in a special sharps container. Do not put them in a trash can. If you do not have a sharps container, call your pharmacist or healthcare provider to get one. A special MedGuide will be given to you by the pharmacist with each prescription and refill. Be sure to read this information carefully each time. Talk to your pediatrician regarding the use of this medicine in children. While this medicine may be used in children as young as 58 month of age for selected conditions, precautions do apply. Overdosage: If you think you have taken too much of this medicine contact a poison control center or emergency room at once. NOTE: This medicine is only for you. Do not share this medicine with others. What if I miss a dose? If you miss a dose, take it as soon as you can. If it is almost time for your next dose, take only that dose. Do not take double or extra doses. What may interact with this medicine? Do not take this  medicine with any of the following medications:  epoetin alfa This list may not describe all possible interactions. Give your health care provider a list of all the medicines, herbs, non-prescription drugs, or dietary supplements you use. Also tell them if you smoke, drink alcohol, or use illegal drugs. Some items may interact with your medicine. What should I watch for while using this medicine? Your  condition will be monitored carefully while you are receiving this medicine. You may need blood work done while you are taking this medicine. This medicine may cause a decrease in vitamin B6. You should make sure that you get enough vitamin B6 while you are taking this medicine. Discuss the foods you eat and the vitamins you take with your health care professional. What side effects may I notice from receiving this medicine? Side effects that you should report to your doctor or health care professional as soon as possible:  allergic reactions like skin rash, itching or hives, swelling of the face, lips, or tongue  breathing problems  changes in vision  chest pain  confusion, trouble speaking or understanding  feeling faint or lightheaded, falls  high blood pressure  muscle aches or pains  pain, swelling, warmth in the leg  rapid weight gain  severe headaches  sudden numbness or weakness of the face, arm or leg  trouble walking, dizziness, loss of balance or coordination  seizures (convulsions)  swelling of the ankles, feet, hands  unusually weak or tired Side effects that usually do not require medical attention (report to your doctor or health care professional if they continue or are bothersome):  diarrhea  fever, chills (flu-like symptoms)  headaches  nausea, vomiting  redness, stinging, or swelling at site where injected This list may not describe all possible side effects. Call your doctor for medical advice about side effects. You may report side effects to FDA at 1-800-FDA-1088. Where should I keep my medicine? Keep out of the reach of children. Store in a refrigerator between 2 and 8 degrees C (36 and 46 degrees F). Do not freeze. Do not shake. Throw away any unused portion if using a single-dose vial. Throw away any unused medicine after the expiration date. NOTE: This sheet is a summary. It may not cover all possible information. If you have questions  about this medicine, talk to your doctor, pharmacist, or health care provider.  2020 Elsevier/Gold Standard (2017-10-10 16:44:20)  Coronavirus (COVID-19) Are you at risk?  Are you at risk for the Coronavirus (COVID-19)?  To be considered HIGH RISK for Coronavirus (COVID-19), you have to meet the following criteria:  . Traveled to [place], [place], [place], [place] or [place]; or in the [place] to [place], [place], [place], or [place]; and have fever, cough, and shortness of breath within the last 2 weeks of travel OR . Been in close contact with a person diagnosed with COVID-19 within the last 2 weeks and have fever, cough, and shortness of breath . IF YOU DO NOT MEET THESE CRITERIA, YOU ARE CONSIDERED LOW RISK FOR COVID-19.  What to do if you are HIGH RISK for COVID-19?  [name] If you are having a medical emergency, call 911. . Seek medical care right away. Before you go to a doctor's office, urgent care or emergency department, call ahead and tell them about your recent travel, contact with someone diagnosed with COVID-19, and your symptoms. You should receive instructions from your physician's office regarding next steps of care.  . When you  arrive at healthcare provider, tell the healthcare staff immediately you have returned from visiting [place], [place], [place], [place] or [place]; or traveled in the [place] to [place], [place], [place], or [place]; in the last two weeks or you have been in close contact with a person diagnosed with COVID-19 in the last 2 weeks.   . Tell the health care staff about your symptoms: fever, cough and shortness of breath. . After you have been seen by a medical provider, you will be either: o Tested for (COVID-19) and discharged home on quarantine except to seek medical care if symptoms worsen, and asked to  - Stay home and avoid contact with others until you get your results (4-5 days)  - Avoid travel on public transportation if possible  (such as bus, train, or airplane) or o Sent to the Emergency Department by EMS for evaluation, COVID-19 testing, and possible admission depending on your condition and test results.  What to do if you are LOW RISK for COVID-19?  Reduce your risk of any infection by using the same precautions used for avoiding the common cold or flu:  [name] Wash your hands often with soap and warm water for at least 20 seconds.  If soap and water are not readily available, use an alcohol-based hand sanitizer with at least 60% alcohol.  . If coughing or sneezing, cover your mouth and nose by coughing or sneezing into the elbow areas of your shirt or coat, into a tissue or into your sleeve (not your hands). . Avoid shaking hands with others and consider head nods or verbal greetings only. . Avoid touching your eyes, nose, or mouth with unwashed hands.  . Avoid close contact with people who are sick. . Avoid places or events with large numbers of people in one location, like concerts or sporting events. . Carefully consider travel plans you have or are making. . If you are planning any travel outside or inside the [place], visit the CDC's Travelers' Health webpage for the latest health notices. . If you have some symptoms but not all symptoms, continue to monitor at home and seek medical attention if your symptoms worsen. . If you are having a medical emergency, call 911.   [place] / e-Visit: [email or web address]         [facility]: [facility]: [phone]                   [facility]: [phone]

## 2019-08-18 NOTE — Progress Notes (Signed)
Verbal order from Dr. Jana Hakim: okay to treat with Scr. of 1.68; patient to talk to her PCP about elevated blood glucose of 351.

## 2019-08-19 ENCOUNTER — Other Ambulatory Visit: Payer: Self-pay | Admitting: Pharmacist

## 2019-08-19 ENCOUNTER — Telehealth: Payer: Self-pay | Admitting: Oncology

## 2019-08-19 NOTE — Telephone Encounter (Signed)
I talk with patient regarding schedule change

## 2019-08-19 NOTE — Patient Outreach (Addendum)
[facility]) Care Management  08/19/2019  [name] 1941/05/02 [id number]    Patient was called to follow up. HIPAA identifiers were obtained. Patient reported feeling better today.  She reported getting her Rituximab infusion yesterday.  Patient is an 78 year old female with multiple medical conditions including but not limited ON:GEXBMW, thrombocytopenia, hypertension, hyperlipidemia, type 2 diabetes, CAD status post CABG, vasculitis, low extremity edema, anxiety, CKD stage III, GERD, A Fib, and PAD.   She did report a rash/bumps around her rectum that hurt when she sits down.  She said she has had the rash for at least a week. She saw Dr. Jana Hakim yesterday but she did not mention it during her appointment.  Patient said she has not had any bleeding or discharge when she wipes or in her underwear.  She also denied headache, nausea or vomiting.  She has had some recent INR fluctuations. Her INR was >10 seven days ago and was 1.5 four days ago. Patient was unsure what caused the drastic increase in her INR (perhaps it was the fluconazole).  Patient is actively followed by the [facility] and has had her dose decreased.  She has an appointment with the [facility] on 08/22/2019.   Encouraged the patient to call her PCP office and let them know about her symptoms.  No medication changes:  Medications Reviewed Today    Reviewed by Jon Billings, RN (Case Manager) on 07/31/19 at 1108  Med List Status: <None>  Medication Order Taking? Sig Documenting Provider Last Dose Status Informant  acetaminophen (TYLENOL) 325 MG tablet [id number] Yes Take 650 mg by mouth every 6 (six) hours as needed for mild pain.  [provider] Taking Active Self  allopurinol (ZYLOPRIM) 100 MG tablet [id number] Yes Take 100 mg by mouth daily. [provider] Taking Active Self  ALPRAZolam (XANAX) 0.5 MG tablet [id number] Yes Take 0.25 mg by mouth at bedtime.  [provider] Taking Active Self           Med Note Gloriann Loan, Ellard Artis   Fri Jun 20, 2019  9:53 AM) "Dr Aundra Dubin increased it to a whole tablet 0.50 mg to see if it will help me sleep".  atorvastatin (LIPITOR) 80 MG tablet [id number] Yes Take 1 tablet (80 mg total) by mouth daily at 6 PM. [name], MD Taking Active Self  Cholecalciferol (VITAMIN D3) 1000 UNITS CAPS [id number] Yes Take 2,000 Units by mouth daily with lunch.  [provider] Taking Active Self           Med Note [name]   Fri Jun 27, 2019 12:31 PM)    ciprofloxacin (CIPRO) 250 MG tablet [id number] Yes Take 1 tablet (250 mg total) by mouth 2 (two) times daily. Magrinat, [name], MD Taking Active   cloNIDine (CATAPRES) 0.2 MG tablet [id number] Yes Take 1 tablet (0.2 mg total) by mouth 2 (two) times daily. [name], MD Taking Active   Cyanocobalamin (VITAMIN B12) 1000 MCG TBCR [id number] Yes Take 1,000 mcg by mouth daily with lunch.  [provider] Taking Active Self  feeding supplement, [place], ([place]) LIQD [id number] Yes Take 237 mLs by mouth 2 (two) times daily between meals. [name], MD Taking Active Self           Med Note Gloriann Loan, Ellard Artis   Fri Jun 20, 2019  9:51 AM) Drinks Boost , not GLucerna  fluconazole (DIFLUCAN) 200 MG tablet [id number] Yes Take 1  tablet (200 mg total) by mouth daily. Gardenia Phlegm, NP Taking Active   furosemide (LASIX) 40 MG tablet [id number] Yes Take 2 tablets (80 mg total) by mouth every morning AND 1 tablet (40 mg total) every evening. [name], MD Taking Active   hydrALAZINE (APRESOLINE) 100 MG tablet [id number] Yes Take 1 tablet (100 mg total) by mouth 3 (three) times daily. [name], MD Taking Active Self  Magnesium 500 MG TABS [id number] Yes Take 500 mg by mouth daily. [provider] Taking Active Self  magnesium oxide (MAG-OX) 400 (241.3 Mg) MG tablet [id number] Yes Take 1 tablet (400 mg total) by mouth daily. [name], MD Taking Active Self           Med Note [name] Jul 07, 2019  3:42 AM)    metFORMIN (GLUCOPHAGE) 500 MG tablet [id number] Yes Take 500 mg by mouth 2 (two) times daily with a meal.  [provider] Taking Active Self  metoprolol tartrate (LOPRESSOR) 25 MG tablet [id number] Yes Take 0.5 tablets (12.5 mg total) by mouth 2 (two) times daily. [place] Donnamarie Poag, DO Taking Active Self  ondansetron (ZOFRAN ODT) 4 MG disintegrating tablet [id number] Yes Take 1 tablet (4 mg total) by mouth every 8 (eight) hours as needed for nausea or vomiting. [name], [place] E, PA-C Taking Active Self           Med Note [name] Jul 07, 2019  3:49 AM)    pantoprazole (PROTONIX) 40 MG tablet [id number] Yes Take 40 mg by mouth 2 (two) times daily.  [provider] Taking Active Self           Med Note [name]   Wed Jun 20, 2017 10:33 AM)    predniSONE (DELTASONE) 5 MG tablet [id number] Yes Take 3 tablets (15 mg total) by mouth daily with breakfast. Magrinat, [name], MD Taking Active   riTUXimab [phone] (RITUXAN) 10 MG/ML injection [id number] Yes Inject into the vein every Tuesday.  [provider] Taking Active Self           Med Note [name] Jul 07, 2019  3:49 AM) Pt gets at the cancer center  spironolactone (ALDACTONE) 25 MG tablet [id number] Yes Take 1 tablet (25 mg total) by mouth daily. [name], MD Taking Active   tadalafil, PAH, (ADCIRCA) 20 MG tablet [id number] Yes Take 2 tablets (40 mg total) by mouth daily. [name], MD Taking Active Self  traMADol [name]) 50 MG tablet [id number] Yes Take 1 tablet (50 mg total) by mouth every 6 (six) hours as needed. Magrinat, [name], MD Taking Active   vitamin C (ASCORBIC ACID) 500 MG tablet [id number] Yes Take 500 mg by mouth daily with lunch. [provider] Taking Active Self  warfarin (COUMADIN) 5 MG tablet [id number] Yes Take 5-7.5 mg by mouth daily. Take 1.5  tablets (7.5 mg) on Tues, Thursday, Saturday and Sunday. Take 1 tablet (5mg ) on Mondays, Wednesdays, and Fridays. [provider] Taking Active Self          Plan: Route note to PCP. Call patient back in 3-5 days to check on her.  [name], PharmD, [place] Clinical Pharmacist [phone]

## 2019-08-21 ENCOUNTER — Other Ambulatory Visit: Payer: Self-pay

## 2019-08-21 NOTE — Patient Outreach (Signed)
[facility]) Care Management  [place]  08/21/2019   [name] [birth year] [id number]  Subjective: Telephone call to patient for check in. Patient reports she is not feeling her best today some stomach discomfort. She states she has good and bad days.  She doing her treatments for anemia every 4 weeks now per patient. She reports that her INR was 1.7 and that her coumadin was adjusted.  She returns on Friday for repeat.  Patient continues to weigh weekly.  She denies any problems with swelling or shortness of breath.  Reviewed weight parameters. Patient continues to pace self with activities in order to conserve energy.  Patient denies any concerns.    Objective:   Encounter Medications:  Outpatient Encounter Medications as of 08/21/2019  Medication Sig Note  . acetaminophen (TYLENOL) 325 MG tablet Take 650 mg by mouth every 6 (six) hours as needed for mild pain.    [name] allopurinol (ZYLOPRIM) 100 MG tablet Take 100 mg by mouth daily.   [name] ALPRAZolam (XANAX) 0.5 MG tablet Take 0.25 mg by mouth at bedtime.  06/20/2019: "Dr Aundra Dubin increased it to a whole tablet 0.50 mg to see if it will help me sleep".  [name] atorvastatin (LIPITOR) 80 MG tablet Take 1 tablet (80 mg total) by mouth daily at 6 PM.   . Cholecalciferol (VITAMIN D3) 1000 UNITS CAPS Take 2,000 Units by mouth daily with lunch.    . cloNIDine (CATAPRES) 0.2 MG tablet Take 1 tablet (0.2 mg total) by mouth 2 (two) times daily.   . Cyanocobalamin (VITAMIN B12) 1000 MCG TBCR Take 1,000 mcg by mouth daily with lunch.    . feeding supplement, GLUCERNA SHAKE, (GLUCERNA SHAKE) LIQD Take 237 mLs by mouth 2 (two) times daily between meals. 06/20/2019: Drinks Boost , not GLucerna  . fluconazole (DIFLUCAN) 200 MG tablet Take 1 tablet (200 mg total) by mouth daily.   . furosemide (LASIX) 40 MG tablet Take 2 tablets (80 mg total) by mouth every morning AND 1 tablet (40 mg total) every evening.   . hydrALAZINE (APRESOLINE) 100  MG tablet Take 1 tablet (100 mg total) by mouth 3 (three) times daily.   . Magnesium 500 MG TABS Take 500 mg by mouth daily.   . magnesium oxide (MAG-OX) 400 (241.3 Mg) MG tablet Take 1 tablet (400 mg total) by mouth daily.   . metFORMIN (GLUCOPHAGE) 500 MG tablet Take 500 mg by mouth 2 (two) times daily with a meal.    . metoprolol tartrate (LOPRESSOR) 25 MG tablet Take 0.5 tablets (12.5 mg total) by mouth 2 (two) times daily.   . ondansetron (ZOFRAN ODT) 4 MG disintegrating tablet Take 1 tablet (4 mg total) by mouth every 8 (eight) hours as needed for nausea or vomiting.   . pantoprazole (PROTONIX) 40 MG tablet Take 40 mg by mouth 2 (two) times daily.    . predniSONE (DELTASONE) 5 MG tablet Take 2 tablets (10 mg total) by mouth daily with breakfast.   . spironolactone (ALDACTONE) 25 MG tablet Take 1 tablet (25 mg total) by mouth daily.   . tadalafil, PAH, (ADCIRCA) 20 MG tablet Take 2 tablets (40 mg total) by mouth daily.   . traMADol (ULTRAM) 50 MG tablet Take 1 tablet (50 mg total) by mouth every 6 (six) hours as needed.   . vitamin C (ASCORBIC ACID) 500 MG tablet Take 500 mg by mouth daily with lunch.   . warfarin (COUMADIN) 5 MG tablet Take 5-7.5 mg  by mouth daily. Take 1.5 tablets (7.5 mg) on Tues, Thursday, Saturday and Sunday. Take 1 tablet (5mg ) on Mondays, Wednesdays, and Fridays.    No facility-administered encounter medications on file as of 08/21/2019.     Functional Status:  In your present state of health, do you have any difficulty performing the following activities: 07/31/2019 05/26/2019  Hearing? N N  Vision? N N  Difficulty concentrating or making decisions? N N  Walking or climbing stairs? Y Y  Comment weakness secondary to weakness and shortness of breath  Dressing or bathing? N N  Doing errands, shopping? Tempie Donning  Comment son or neighbor assists -  Conservation officer, nature and eating ? N -  Using the Toilet? N -  In the past six months, have you accidently leaked urine? Y -   Comment occasional -  Do you have problems with loss of bowel control? N -  Managing your Medications? N -  Managing your Finances? N -  Housekeeping or managing your Housekeeping? Y -  Comment family assists -  Some recent data might be hidden    Fall/Depression Screening: Fall Risk  07/31/2019 03/07/2019 12/27/2018  Falls in the past year? 0 1 0  Comment - - -  Number falls in past yr: - 21 -  Injury with Fall? - 1 -  Risk for fall due to : - History of fall(s) -  Risk for fall due to: Comment - - -  Follow up - Falls prevention discussed -   PHQ 2/9 Scores 07/31/2019 05/12/2019 03/24/2019 07/09/2018 06/13/2018 04/04/2018  PHQ - 2 Score 2 3 1  0 0 0  PHQ- 9 Score 9 9 - - - -    Assessment: Patient continues to disease management of chronic illnesses.    Plan:  [facility] CM Care Plan Problem One     Most Recent Value  Care Plan Problem One  Activity intolerance related to anemia  Role Documenting the Problem One  Care Management Telephonic [place] for Problem One  Active  [facility] Long Term Goal   Patient will report continued ability to complete activities of daily living over the next 60 days.  [facility] Long Term Goal Start Date  07/31/19  Interventions for Problem One Long Term Goal  Patient weighing at least weekly, having treatments every 4 weeks now.  Patient taking medications as prescribed.      RN CM will outreach patient in the month of December and patient agreeable.  [name], RN, MSN [place] Management Care Management Coordinator Direct Line [phone] Cell [phone] Toll Free: [phone]  Fax: [phone]

## 2019-08-22 ENCOUNTER — Ambulatory Visit (INDEPENDENT_AMBULATORY_CARE_PROVIDER_SITE_OTHER): Payer: Medicare HMO | Admitting: Pharmacist

## 2019-08-22 DIAGNOSIS — I482 Chronic atrial fibrillation, unspecified: Secondary | ICD-10-CM | POA: Diagnosis not present

## 2019-08-22 DIAGNOSIS — I480 Paroxysmal atrial fibrillation: Secondary | ICD-10-CM | POA: Diagnosis not present

## 2019-08-22 DIAGNOSIS — L89152 Pressure ulcer of sacral region, stage 2: Secondary | ICD-10-CM | POA: Diagnosis not present

## 2019-08-22 DIAGNOSIS — E46 Unspecified protein-calorie malnutrition: Secondary | ICD-10-CM | POA: Diagnosis not present

## 2019-08-22 DIAGNOSIS — R600 Localized edema: Secondary | ICD-10-CM | POA: Diagnosis not present

## 2019-08-22 LAB — POCT INR: INR: 2 (ref 2.0–3.0)

## 2019-08-25 ENCOUNTER — Other Ambulatory Visit: Payer: Self-pay | Admitting: Pharmacist

## 2019-08-25 NOTE — Patient Outreach (Signed)
[place] [facility]) Care Management  08/25/2019  [name] 12/27/1940 [id number]   Patient was called to follow up on her INR. HIPAA identifiers were obtained. Patient had an anticoag appointment 08/22/2019 and her INR had normalized back to 2. Her goal is 2-3.  No medication changes:  Medications Reviewed Today    Reviewed by [name], [facility] (Pharmacist) on 08/25/19 at 1200  Med List Status: <None>  Medication Order Taking? Sig Documenting Provider Last Dose Status Informant  acetaminophen (TYLENOL) 325 MG tablet [id number] Yes Take 650 mg by mouth every 6 (six) hours as needed for mild pain.  [provider] Taking Active Self  allopurinol (ZYLOPRIM) 100 MG tablet [id number] Yes Take 100 mg by mouth daily. [provider] Taking Active Self  ALPRAZolam (XANAX) 0.5 MG tablet [id number] Yes Take 0.25 mg by mouth at bedtime.  [provider] Taking Active Self           Med Note Gloriann Loan, Ellard Artis   Fri Jun 20, 2019  9:53 AM) "Dr Aundra Dubin increased it to a whole tablet 0.50 mg to see if it will help me sleep".  atorvastatin (LIPITOR) 80 MG tablet [id number] Yes Take 1 tablet (80 mg total) by mouth daily at 6 PM. [name], MD Taking Active Self  Cholecalciferol (VITAMIN D3) 1000 UNITS CAPS [id number] Yes Take 2,000 Units by mouth daily with lunch.  [provider] Taking Active Self           Med Note [name]   Fri Jun 27, 2019 12:31 PM)    cloNIDine (CATAPRES) 0.2 MG tablet [id number] Yes Take 1 tablet (0.2 mg total) by mouth 2 (two) times daily. [name], MD Taking Active   Cyanocobalamin (VITAMIN B12) 1000 MCG TBCR [id number] Yes Take 1,000 mcg by mouth daily with lunch.  [provider] Taking Active Self  feeding supplement, [place], ([place]) LIQD [id number] Yes Take 237 mLs by mouth 2 (two) times daily between meals. [name], MD Taking Active Self           Med Note Gloriann Loan, Ellard Artis   Fri Jun 20, 2019  9:51 AM) Drinks Boost , not GLucerna  fluconazole (DIFLUCAN) 200 MG tablet [id number] Yes Take 1 tablet (200 mg total) by mouth daily. [place]ia Phlegm, NP Taking Active   furosemide (LASIX) 40 MG tablet [id number] Yes Take 2 tablets (80 mg total) by mouth every morning AND 1 tablet (40 mg total) every evening. [name], MD Taking Active   hydrALAZINE (APRESOLINE) 100 MG tablet [id number] Yes Take 1 tablet (100 mg total) by mouth 3 (three) times daily. [name], MD Taking Active Self  Magnesium 500 MG TABS [id number] Yes Take 500 mg by mouth daily. [provider] Taking Active Self  magnesium oxide (MAG-OX) 400 (241.3 Mg) MG tablet [id number] Yes Take 1 tablet (400 mg total) by mouth daily. [name], MD Taking Active Self           Med Note [name] Jul 07, 2019  3:42 AM)    metFORMIN (GLUCOPHAGE) 500 MG tablet [id number] Yes Take 500 mg by mouth 2 (two) times daily with a meal.  [provider] Taking Active Self  metoprolol tartrate (LOPRESSOR) 25 MG tablet [id number] Yes Take 0.5 tablets (12.5 mg total) by mouth 2 (two) times daily. [place] Donnamarie Poag, DO Taking Active Self  ondansetron (ZOFRAN ODT) 4 MG disintegrating tablet  [id number] Yes Take 1 tablet (4 mg total) by mouth every 8 (eight) hours as needed for nausea or vomiting. [name], [place] E, PA-C Taking Active Self           Med Note [name] Jul 07, 2019  3:49 AM)    pantoprazole (PROTONIX) 40 MG tablet [id number] Yes Take 40 mg by mouth 2 (two) times daily.  [provider] Taking Active Self           Med Note [name]   Wed Jun 20, 2017 10:33 AM)    predniSONE (DELTASONE) 5 MG tablet [id number] Yes Take 2 tablets (10 mg total) by mouth daily with breakfast. Magrinat, [name], MD Taking Active   spironolactone (ALDACTONE) 25 MG tablet [id number] Yes Take 1 tablet (25 mg total) by mouth daily. [name], MD Taking Active    tadalafil, PAH, (ADCIRCA) 20 MG tablet [id number] Yes Take 2 tablets (40 mg total) by mouth daily. [name], MD Taking Active Self  traMADol [name]) 50 MG tablet [id number] Yes Take 1 tablet (50 mg total) by mouth every 6 (six) hours as needed. Magrinat, [name], MD Taking Active   vitamin C (ASCORBIC ACID) 500 MG tablet [id number] Yes Take 500 mg by mouth daily with lunch. [provider] Taking Active Self  warfarin (COUMADIN) 5 MG tablet [id number] Yes Take 5-7.5 mg by mouth daily. Take 1.5 tablets (7.5 mg) on Tues, Thursday, Saturday and Sunday. Take 1 tablet (5mg ) on Mondays, Wednesdays, and Fridays. [provider] Taking Active Self          Patient said she followed up with her provider about the rash on her rectum as instructed. She said it was a "pressure fracture" and put a patch on it. She said the rash is actually right across her backbone close to the rectum but not on it.  Plan: Follow up with the patient in 3-4 weeks.  [name], PharmD, [place] Clinical Pharmacist [phone]

## 2019-08-29 ENCOUNTER — Other Ambulatory Visit: Payer: Self-pay

## 2019-08-29 ENCOUNTER — Ambulatory Visit (INDEPENDENT_AMBULATORY_CARE_PROVIDER_SITE_OTHER): Payer: Medicare HMO | Admitting: Pharmacist Clinician (PhC)/ Clinical Pharmacy Specialist

## 2019-08-29 DIAGNOSIS — Z7901 Long term (current) use of anticoagulants: Secondary | ICD-10-CM

## 2019-08-29 DIAGNOSIS — Z952 Presence of prosthetic heart valve: Secondary | ICD-10-CM | POA: Diagnosis not present

## 2019-08-29 LAB — POCT INR: INR: 1.5 — AB (ref 2.0–3.0)

## 2019-09-01 ENCOUNTER — Inpatient Hospital Stay: Payer: Medicare HMO

## 2019-09-01 ENCOUNTER — Telehealth: Payer: Self-pay

## 2019-09-01 ENCOUNTER — Other Ambulatory Visit: Payer: Self-pay

## 2019-09-01 ENCOUNTER — Ambulatory Visit: Payer: Medicare HMO | Admitting: Adult Health

## 2019-09-01 DIAGNOSIS — D508 Other iron deficiency anemias: Secondary | ICD-10-CM

## 2019-09-01 DIAGNOSIS — I739 Peripheral vascular disease, unspecified: Secondary | ICD-10-CM

## 2019-09-01 DIAGNOSIS — D696 Thrombocytopenia, unspecified: Secondary | ICD-10-CM

## 2019-09-01 DIAGNOSIS — I776 Arteritis, unspecified: Secondary | ICD-10-CM | POA: Diagnosis not present

## 2019-09-01 DIAGNOSIS — M8580 Other specified disorders of bone density and structure, unspecified site: Secondary | ICD-10-CM | POA: Diagnosis not present

## 2019-09-01 DIAGNOSIS — R7402 Elevation of levels of lactic acid dehydrogenase (LDH): Secondary | ICD-10-CM | POA: Diagnosis not present

## 2019-09-01 DIAGNOSIS — N189 Chronic kidney disease, unspecified: Secondary | ICD-10-CM | POA: Diagnosis not present

## 2019-09-01 DIAGNOSIS — D631 Anemia in chronic kidney disease: Secondary | ICD-10-CM | POA: Diagnosis not present

## 2019-09-01 DIAGNOSIS — Z5112 Encounter for antineoplastic immunotherapy: Secondary | ICD-10-CM | POA: Diagnosis not present

## 2019-09-01 DIAGNOSIS — D594 Other nonautoimmune hemolytic anemias: Secondary | ICD-10-CM

## 2019-09-01 LAB — CBC WITH DIFFERENTIAL/PLATELET
Abs Immature Granulocytes: 0.11 10*3/uL — ABNORMAL HIGH (ref 0.00–0.07)
Basophils Absolute: 0.1 10*3/uL (ref 0.0–0.1)
Basophils Relative: 1 %
Eosinophils Absolute: 0.1 10*3/uL (ref 0.0–0.5)
Eosinophils Relative: 2 %
HCT: 34.6 % — ABNORMAL LOW (ref 36.0–46.0)
Hemoglobin: 11 g/dL — ABNORMAL LOW (ref 12.0–15.0)
Immature Granulocytes: 2 %
Lymphocytes Relative: 14 %
Lymphs Abs: 0.6 10*3/uL — ABNORMAL LOW (ref 0.7–4.0)
MCH: 27.4 pg (ref 26.0–34.0)
MCHC: 31.8 g/dL (ref 30.0–36.0)
MCV: 86.1 fL (ref 80.0–100.0)
Monocytes Absolute: 0.4 10*3/uL (ref 0.1–1.0)
Monocytes Relative: 8 %
Neutro Abs: 3.3 10*3/uL (ref 1.7–7.7)
Neutrophils Relative %: 73 %
Platelets: 119 10*3/uL — ABNORMAL LOW (ref 150–400)
RBC: 4.02 MIL/uL (ref 3.87–5.11)
RDW: 16.3 % — ABNORMAL HIGH (ref 11.5–15.5)
WBC: 4.5 10*3/uL (ref 4.0–10.5)
nRBC: 0 % (ref 0.0–0.2)

## 2019-09-01 LAB — RETICULOCYTES
Immature Retic Fract: 18.2 % — ABNORMAL HIGH (ref 2.3–15.9)
RBC.: 4.03 MIL/uL (ref 3.87–5.11)
Retic Count, Absolute: 79.8 10*3/uL (ref 19.0–186.0)
Retic Ct Pct: 2 % (ref 0.4–3.1)

## 2019-09-01 LAB — CMP (CANCER CENTER ONLY)
ALT: 13 U/L (ref 0–44)
AST: 24 U/L (ref 15–41)
Albumin: 3.2 g/dL — ABNORMAL LOW (ref 3.5–5.0)
Alkaline Phosphatase: 66 U/L (ref 38–126)
Anion gap: 12 (ref 5–15)
BUN: 53 mg/dL — ABNORMAL HIGH (ref 8–23)
CO2: 21 mmol/L — ABNORMAL LOW (ref 22–32)
Calcium: 8.7 mg/dL — ABNORMAL LOW (ref 8.9–10.3)
Chloride: 101 mmol/L (ref 98–111)
Creatinine: 1.64 mg/dL — ABNORMAL HIGH (ref 0.44–1.00)
GFR, Est AFR Am: 34 mL/min — ABNORMAL LOW (ref >60)
GFR, Estimated: 29 mL/min — ABNORMAL LOW (ref >60)
Glucose, Bld: 275 mg/dL — ABNORMAL HIGH (ref 70–99)
Potassium: 3.5 mmol/L (ref 3.5–5.1)
Sodium: 134 mmol/L — ABNORMAL LOW (ref 135–145)
Total Bilirubin: 0.5 mg/dL (ref 0.3–1.2)
Total Protein: 5.6 g/dL — ABNORMAL LOW (ref 6.5–8.1)

## 2019-09-01 LAB — IRON AND TIBC
Iron: 41 ug/dL (ref 41–142)
Saturation Ratios: 16 % — ABNORMAL LOW (ref 21–57)
TIBC: 255 ug/dL (ref 236–444)
UIBC: 214 ug/dL (ref 120–384)

## 2019-09-01 LAB — SAVE SMEAR(SSMR), FOR PROVIDER SLIDE REVIEW

## 2019-09-01 LAB — FERRITIN: Ferritin: 155 ng/mL (ref 11–307)

## 2019-09-01 NOTE — Progress Notes (Signed)
Pt. Hemoglobin 11.0 no Aransep today per parameters.

## 2019-09-01 NOTE — Telephone Encounter (Signed)
Pt's HGB 11.0 today per parameters and Dr. Jana Hakim no injection today.

## 2019-09-09 DIAGNOSIS — E119 Type 2 diabetes mellitus without complications: Secondary | ICD-10-CM | POA: Diagnosis not present

## 2019-09-09 DIAGNOSIS — H43821 Vitreomacular adhesion, right eye: Secondary | ICD-10-CM | POA: Diagnosis not present

## 2019-09-11 ENCOUNTER — Ambulatory Visit (INDEPENDENT_AMBULATORY_CARE_PROVIDER_SITE_OTHER): Payer: Medicare HMO | Admitting: Pharmacist Clinician (PhC)/ Clinical Pharmacy Specialist

## 2019-09-11 ENCOUNTER — Encounter (HOSPITAL_COMMUNITY): Payer: Self-pay | Admitting: Cardiology

## 2019-09-11 ENCOUNTER — Ambulatory Visit (HOSPITAL_COMMUNITY)
Admission: RE | Admit: 2019-09-11 | Discharge: 2019-09-11 | Disposition: A | Discharge status: Home / Self Care | Payer: Medicare HMO | Source: Ambulatory Visit | Attending: Cardiology | Admitting: Cardiology

## 2019-09-11 ENCOUNTER — Other Ambulatory Visit: Payer: Self-pay

## 2019-09-11 VITALS — BP 170/54 | HR 54 | Wt 122.6 lb

## 2019-09-11 DIAGNOSIS — I5032 Chronic diastolic (congestive) heart failure: Secondary | ICD-10-CM | POA: Insufficient documentation

## 2019-09-11 DIAGNOSIS — E785 Hyperlipidemia, unspecified: Secondary | ICD-10-CM | POA: Diagnosis not present

## 2019-09-11 DIAGNOSIS — Z7984 Long term (current) use of oral hypoglycemic drugs: Secondary | ICD-10-CM | POA: Insufficient documentation

## 2019-09-11 DIAGNOSIS — M301 Polyarteritis with lung involvement [Churg-Strauss]: Secondary | ICD-10-CM | POA: Insufficient documentation

## 2019-09-11 DIAGNOSIS — Z7901 Long term (current) use of anticoagulants: Secondary | ICD-10-CM | POA: Insufficient documentation

## 2019-09-11 DIAGNOSIS — I480 Paroxysmal atrial fibrillation: Secondary | ICD-10-CM | POA: Diagnosis not present

## 2019-09-11 DIAGNOSIS — M109 Gout, unspecified: Secondary | ICD-10-CM | POA: Diagnosis not present

## 2019-09-11 DIAGNOSIS — Z952 Presence of prosthetic heart valve: Secondary | ICD-10-CM | POA: Diagnosis not present

## 2019-09-11 DIAGNOSIS — D61818 Other pancytopenia: Secondary | ICD-10-CM | POA: Diagnosis not present

## 2019-09-11 DIAGNOSIS — Z79899 Other long term (current) drug therapy: Secondary | ICD-10-CM | POA: Insufficient documentation

## 2019-09-11 DIAGNOSIS — I2510 Atherosclerotic heart disease of native coronary artery without angina pectoris: Secondary | ICD-10-CM | POA: Insufficient documentation

## 2019-09-11 DIAGNOSIS — I739 Peripheral vascular disease, unspecified: Secondary | ICD-10-CM | POA: Insufficient documentation

## 2019-09-11 DIAGNOSIS — I110 Hypertensive heart disease with heart failure: Secondary | ICD-10-CM | POA: Insufficient documentation

## 2019-09-11 DIAGNOSIS — Z7952 Long term (current) use of systemic steroids: Secondary | ICD-10-CM | POA: Insufficient documentation

## 2019-09-11 DIAGNOSIS — I482 Chronic atrial fibrillation, unspecified: Secondary | ICD-10-CM | POA: Diagnosis not present

## 2019-09-11 DIAGNOSIS — E119 Type 2 diabetes mellitus without complications: Secondary | ICD-10-CM | POA: Insufficient documentation

## 2019-09-11 DIAGNOSIS — Z8711 Personal history of peptic ulcer disease: Secondary | ICD-10-CM | POA: Insufficient documentation

## 2019-09-11 DIAGNOSIS — I272 Pulmonary hypertension, unspecified: Secondary | ICD-10-CM | POA: Insufficient documentation

## 2019-09-11 DIAGNOSIS — Z951 Presence of aortocoronary bypass graft: Secondary | ICD-10-CM | POA: Diagnosis not present

## 2019-09-11 DIAGNOSIS — Z87891 Personal history of nicotine dependence: Secondary | ICD-10-CM | POA: Insufficient documentation

## 2019-09-11 LAB — BASIC METABOLIC PANEL
Anion gap: 10 (ref 5–15)
BUN: 58 mg/dL — ABNORMAL HIGH (ref 8–23)
CO2: 22 mmol/L (ref 22–32)
Calcium: 9.1 mg/dL (ref 8.9–10.3)
Chloride: 103 mmol/L (ref 98–111)
Creatinine, Ser: 1.53 mg/dL — ABNORMAL HIGH (ref 0.44–1.00)
GFR calc Af Amer: 37 mL/min — ABNORMAL LOW (ref >60)
GFR calc non Af Amer: 32 mL/min — ABNORMAL LOW (ref >60)
Glucose, Bld: 190 mg/dL — ABNORMAL HIGH (ref 70–99)
Potassium: 4 mmol/L (ref 3.5–5.1)
Sodium: 135 mmol/L (ref 135–145)

## 2019-09-11 LAB — LIPID PANEL
Cholesterol: 127 mg/dL (ref 0–200)
HDL: 31 mg/dL — ABNORMAL LOW (ref >40)
LDL Cholesterol: 60 mg/dL (ref 0–99)
Total CHOL/HDL Ratio: 4.1 RATIO
Triglycerides: 180 mg/dL — ABNORMAL HIGH (ref <150)
VLDL: 36 mg/dL (ref 0–40)

## 2019-09-11 LAB — POCT INR: INR: 1.7 — AB (ref 2.0–3.0)

## 2019-09-11 MED ORDER — AMLODIPINE BESYLATE 5 MG PO TABS: 5.0000 mg | ORAL_TABLET | Freq: Every day | ORAL | 3 refills | Status: DC

## 2019-09-11 MED ORDER — FUROSEMIDE 40 MG PO TABS: 80.0000 mg | ORAL_TABLET | Freq: Two times a day (BID) | ORAL | 3 refills | Status: DC

## 2019-09-11 NOTE — Progress Notes (Signed)
Date:  09/11/2019   ID:  SUHEY RADFORD, DOB November 28, 1940, MRN [id number]    Provider location: [place] [facility] Type of Visit: Established patient   PCP:  [name], MD  Cardiologist:  Quay Burow, MD Primary HF: Dr. Aundra Dubin  Chief Complaint: Shortness of breath   History of Present Illness: [name] is a 78 y.o. female who has a history of PAD, CAD s/p CABG, mechanical AVR, pulmonary hypertension, and chronic diastolic CHF with prominent RV dysfunction. Patient had CABG-AVR in 10/05.  She has been in chronic atrial fibrillation, it appears, since at least 4/17.  She has significant PAD followed by Dr. [name].    She was admitted in 5/19 with dyspnea and marked volume overloaded. She was started on Lasix and it was titrated up.  Echo showed EF 55-60% with moderate RV dilation, severe RV systolic dysfunction, and PASP 88 mmHg.  [place] in 9/19 showed moderate pulmonary hypertension with near normal filling pressures. PVR was not particularly high at 3.1 WU primarily due to elevated cardiac output. V/Q scan did not show chronic PEs and PFTs were unremarkable. She is on Adcirca.   She was admitted in 2/20 with upper GI bleeding, found to have gastric ulcers.  She had a CT chest in 3/20 with subtle bilateral ground glass opacities.  Referred to pulmonary.   She was admitted again in 6/20 with a GI bleed and required transfusion.   She was seen by pulmonary in 7/20, PFTs showed no obstruction/restriction but decreased DLCO.    She was seen by rheumatology due to concern for rheumatoid arthritis.  She was thought to have gout and OA but no RA.   Echo was done in 7/20 showing  EF 60-65% with mild LVH, normal RV, mechanical aortic valve with mean gradient 17 mmHg, PASP 34 mmHg.   In 8/20, she was admitted with CHF/pneumonitis and pancytopenia.  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  She has been  treated  with rituximab and prednisone, hgb has been increasing.   She returns today for followup of CHF, CAD, atrial fibrillation.  BP has been running high, 170/54 today.  She walks with a walker for balance, denies dyspnea walking short distances on flat ground.  Weight down 3 lbs from prior appointment.  No chest pain.  No lightheadedness.  She continues to have significant lower extremity edema.   REDS clip 41%   Labs (8/19): K 4.2, creatinine 0.8, LDL 56 Labs (9/19): K 4.3, Na 129, creatinine 0.87, hgb 9.8 Labs (10/19): ANA+, RF 36.8 (elevated), SCL-70 negative, anti-centromere Ab negative, ds-DNA negative.  Labs (12/19): K 4, creatinine 1.09 Labs (1/20): CCP Ab elevated 115 Labs (2/20): K 4.1, creatinine 1.24 Labs (7/20): K 4.2, creatinine 1.99, hgb 6.5 (transfused) Labs (11/20): K 3.5, creatinine 1.64  6 min walk (10/19): 189 m 6 min walk (1/20): 28 m  PMH: 1. Mechanical [employer] Jude aortic valve 10/05: Goal INR 2.5-3.5.  2. CAD: s/p CABG x 2 with AVR in 10/05.  - Cardiolite in 2014 showed no ischemia.  3. HTN 4. Hyperlipidemia 5. Type 2 diabetes 6. PAD: Peripheral arterial dopplers (7/19) with >50% CIA stenosis on right, 70-99% CIA stenosis on the left.  7. H/o CCY 8. Atrial fibrillation: Chronic since at least 4/17. 9. Chronic diastolic CHF with prominent RV dysfunction: Echo (5/19) with EF 40-34%, grade 3 diastolic dysfunction, mechanical aortic valve with mean gradient 10 mmHg,  mild MR, moderate RV dilation with moderate-severely decreased RV systolic function, biatrial enlargement, PASP 88 mmHg.  - Echo (7/20): EF 60-65% with mild LVH, normal RV, mechanical aortic valve with mean gradient 17 mmHg, PASP 34 mmHg.  10. Carotid stenosis: 40-59% BICA stenosis on 7/19 carotid dopplers.  11. Pulmonary hypertension: RHC (9/19) with mean RA 5, PA 63/17 mean 36, mean PCWP 16, CI 4.16, PVR 3.1 WU.  - V/Q scan (10/19): no evidence for chronic PE.  - PFTs (10/19): minimal obstruction.  -  ANA+, RF 36.8 (elevated), SCL-70 negative, anti-centromere Ab negative, ds-DNA negative, CCP Ab positive. She saw rheumatology, NOT thought to have RA.  - Echo in 7/20 with normal-appearing RV and PASP 34 mmHg.  - PFTs (7/20) with no significant obstruction/restriction but decreased DLCO.  - PYP scan not suggestive of TTR amyloidosis.  12. 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 History of GI bleeding.  15. 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  - Getting Rituximab and prednisone.   Current Outpatient Medications  Medication Sig Dispense Refill   acetaminophen (TYLENOL) 325 MG tablet Take 650 mg by mouth every 6 (six) hours as needed for mild pain.      allopurinol (ZYLOPRIM) 100 MG tablet Take 100 mg by mouth daily.     ALPRAZolam (XANAX) 0.5 MG tablet Take 0.25 mg by mouth at bedtime.      atorvastatin (LIPITOR) 80 MG tablet Take 1 tablet (80 mg total) by mouth daily at 6 PM.     Cholecalciferol (VITAMIN D3) 1000 UNITS CAPS Take 2,000 Units by mouth daily with lunch.      cloNIDine (CATAPRES) 0.2 MG tablet Take 1 tablet (0.2 mg total) by mouth 2 (two) times daily. 60 tablet 3   Cyanocobalamin (VITAMIN B12) 1000 MCG TBCR Take 1,000 mcg by mouth daily with lunch.      feeding supplement, GLUCERNA SHAKE, (GLUCERNA SHAKE) LIQD Take 237 mLs by mouth 2 (two) times daily between meals. 237 mL 30   fluconazole (DIFLUCAN) 200 MG tablet Take 1 tablet (200 mg total) by mouth daily. 5 tablet 2   furosemide (LASIX) 40 MG tablet Take 2 tablets (80 mg total) by mouth 2 (two) times daily. 120 tablet 3   hydrALAZINE (APRESOLINE) 100 MG tablet Take 1 tablet (100 mg total) by mouth 3 (three) times daily. 270 tablet 1   Magnesium 500 MG TABS Take 500 mg by mouth daily.     magnesium oxide (MAG-OX) 400 (241.3 Mg) MG tablet Take 1 tablet (400 mg total) by mouth daily.     metFORMIN (GLUCOPHAGE) 500 MG tablet Take 500 mg by mouth 2 (two)  times daily with a meal.      metoprolol tartrate (LOPRESSOR) 25 MG tablet Take 0.5 tablets (12.5 mg total) by mouth 2 (two) times daily. 60 tablet 0   ondansetron (ZOFRAN ODT) 4 MG disintegrating tablet Take 1 tablet (4 mg total) by mouth every 8 (eight) hours as needed for nausea or vomiting. 6 tablet 0   pantoprazole (PROTONIX) 40 MG tablet Take 40 mg by mouth 2 (two) times daily.      predniSONE (DELTASONE) 5 MG tablet Take 2 tablets (10 mg total) by mouth daily with breakfast. 90 tablet 2   spironolactone (ALDACTONE) 25 MG tablet Take 1 tablet (25 mg total) by mouth daily. 90 tablet 3   tadalafil, PAH, (ADCIRCA) 20 MG tablet Take 2 tablets (40 mg total) by mouth daily. 60 tablet 6   traMADol (ULTRAM)  50 MG tablet Take 1 tablet (50 mg total) by mouth every 6 (six) hours as needed. 15 tablet 0   vitamin C (ASCORBIC ACID) 500 MG tablet Take 500 mg by mouth daily with lunch.     warfarin (COUMADIN) 5 MG tablet Take 5-7.5 mg by mouth daily. Take 1.5 tablets (7.5 mg) on Tues, Thursday, Saturday and Sunday. Take 1 tablet (5mg ) on Mondays, Wednesdays, and Fridays.     amLODipine (NORVASC) 5 MG tablet Take 1 tablet (5 mg total) by mouth daily. 30 tablet 3   No current facility-administered medications for this encounter.     Allergies:   Flagyl [metronidazole], Coreg [carvedilol], Losartan, Verapamil, Zetia [ezetimibe], and Zocor [simvastatin - high dose]   Social History:  The patient  reports that she has quit smoking. Her smoking use included cigarettes. She has a 30.00 pack-year smoking history. She has never used smokeless tobacco. She reports that she does not drink alcohol or use drugs.   Family History:  [name] family history is not on file.   ROS:  Please see the history of present illness.   All other systems are personally reviewed and negative.   Exam:   BP (!) 170/54    Pulse (!) 54    Wt 55.6 kg (122 lb 9.6 oz)    SpO2 97%    BMI 22.42 kg/m  General: NAD Neck:  JVP 8 cm with HJR, no thyromegaly or thyroid nodule.  Lungs: Clear to auscultation bilaterally with normal respiratory effort. CV: Nondisplaced PMI.  Heart regular S1/S2 with mechanical S2, no S3/S4, 2/6 early SEM RUSB.  2+ ankle edema.  No carotid bruit.  Difficult to palpate pedal pulses.  Abdomen: Soft, nontender, no hepatosplenomegaly, no distention.  Skin: Intact without lesions or rashes.  Neurologic: Alert and oriented x 3.  Psych: Normal affect. Extremities: No clubbing or cyanosis.  HEENT: Normal.    Recent Labs: 05/30/2019: Magnesium 2.1 07/07/2019: B Natriuretic Peptide 311.8 09/01/2019: ALT 13; Hemoglobin 11.0; Platelets 119 09/11/2019: BUN 58; Creatinine, Ser 1.53; Potassium 4.0; Sodium 135  Personally reviewed   Wt Readings from Last 3 Encounters:  09/11/19 55.6 kg (122 lb 9.6 oz)  08/18/19 56.4 kg (124 lb 4.8 oz)  08/04/19 55.7 kg (122 lb 14.4 oz)     ASSESSMENT AND PLAN:  1. Atrial fibrillation: She has been thought to be in permanent atrial fibrillation in the past, but last few ECGs have shown NSR.   - Can continue low dose metoprolol.  Would hold off on anti-arrhythmic meds.   - Continue warfarin, goal INR 2.5-3.5. No ASA with history of GI bleeding.  2. Chronic diastolic CHF: With prominent RV dysfunction and severe pulmonary hypertension by echo in 2019. Filling pressures were well-compensated on 9/19 RHC. Repeat echo in 7/20 showed normal RV size and systolic function and PA systolic pressure down to 34 mmHg.  PYP scan did not suggest ATTR amyloidosis.  NYHA class II-III. She is volume overloaded by exam and REDS clip also suggests volume overload at 41%.   - Increase Lasix to 80 mg bid.  BMET today and again in 10 days.   3. CAD: No chest pain.   - Continue atorvastatin and warfarin.  - Check lipids today.   4. PAD: Significant on 7/19 dopplers but no claudication, no pedal ulcerations. She will need repeat peripheral arterial dopplers ordered.  5. Mechanical  aortic valve: Stable on 7/20 echo.  Given co-existing atrial fibrillation, goal INR is 2.5-3.5.  - Continue  warfarin.  - No ASA with GI bleeding.  6. Pulmonary hypertension: Moderate pulmonary hypertension on 9/19 with RV dysfunction and dilation noted on last echo. PVR only 3.1 WU but cardiac output was high.  V/Q scan in 10/19 with no evidence for chronic PEs and PFTs in 10/19 and again in 7/20 were unremarkable except for low DLCO.  I think there is a possible group 1 PH component though this is a borderline call as PVR is not significantly elevated. ANA and RF positive, as is CCP Ab.  She saw rheumatology and per her report, was not thought to have rheumatoid arthritis.  Subsequently, she has been diagnosed with p-ANCA+ vasculitis.  CT chest showed subtle bilateral ground glass opacities in 3/20, she is followed by pulmonary.    - Continue Adcirca 40 mg daily.  It was borderline to start this, and I am not sure she has had marked benefit.  Will hold off on starting a 2nd [place] medication for now.  Repeat echo in 7/20 showed normal RV and estimated PA systolic pressure 34 mmHg.  7. Rheumatology: Joint pain, positive RF and CCP Ab.  Lung findings (ground glass, pulmonary hypertension) also thought to be concerning for RA. However, per patient's report, she was by her rheumatologist that did not have RA and did have gout.  More recently, she has been found to have p-ANCA+ vasculitis, eosinophilic granulomatosis with polyangiitis.  She is being treated with rituximab and prednisone by Dr Jana Hakim.   8. HTN: BP has been running significantly high.  She is on clonidine, spironolactone, and hydralazine.  - Add amlodipine 5 mg daily.   Followup in 3 months.     Signed, Loralie Champagne, MD  09/11/2019   [place] [address] [facility] [place] [address] [phone] (office) [phone] (fax)

## 2019-09-11 NOTE — Patient Instructions (Signed)
Increase dose to 5mg  daily except for 7.5mg  every Monday, Wednesday and Friday. INR in 2weeks  Call [phone] if you need to change the appointment

## 2019-09-11 NOTE — Patient Instructions (Addendum)
START Amlodipine 5mg  daily.  Increase Lasix to 80mg  twice daily.  Routine lab work today. Will notify you of abnormal results  Repeat labs in 10 days.   Follow up in 1 months.

## 2019-09-14 NOTE — Progress Notes (Signed)
[name]  [phone] [phone]    ID: [name] DOB: 1941-02-22  MR#: [id number]  HYW#:[id number]  Patient Care Team: [name], MD as PCP - General (Internal Medicine) [name], MD as PCP - Advanced Heart Failure (Cardiology) Lorretta Harp, MD as PCP - Cardiology (Cardiology) Shaylie Eklund, [name] Dad, MD as Consulting Physician (Hematology and Oncology) Arta Silence, MD as Consulting Physician (Gastroenterology) [name], MD as Consulting Physician (Cardiology) Marzetta Board, DPM as Consulting Physician (Podiatry) Gavin Pound, MD as Consulting Physician (Rheumatology) [name], [facility] as [facility] Management (Pharmacist) Jon Billings, RN as [facility] Management OTHER MD:   CHIEF COMPLAINT: anemia and thrombocytopenia  CURRENT TREATMENT: [Feraheme], Retacrit, rituximab   INTERVAL HISTORY: [name] returns today for follow-up and treatment of her multifactorial anemia and leukopenia.  She continues on Retacrit for her anemia of renal insufficiency, currently at 200 mcg every 14 days.  She has no side effects from this medication that she is aware of  She also receives rituximab, every 4 weeks, with a dose due today.  She is not aware of any problems from this medication.  We are tapering her prednisone.  At her last visit 4 weeks ago we dropped her dose to 5 mg daily.  She has had no issues regarding this and she is stopping the prednisone beginning today  Her last blood transfusion was 05/27/2019. Her Hb has been trending upwards Lab Results  Component Value Date   HGB 11.0 (L) 09/01/2019   HGB 10.1 (L) 08/18/2019   HGB 10.7 (L) 08/04/2019   HGB 10.1 (L) 07/28/2019   HGB 9.2 (L) 07/21/2019   Her ferritin remains >100 after her last feraheme dose, 05/12/2019: Lab Results  Component Value Date   FERRITIN 155 09/01/2019   FERRITIN 266 08/18/2019   FERRITIN 290 08/04/2019   FERRITIN 167 07/28/2019   FERRITIN 208 07/21/2019    Her reticulocyte counts remain very variable Results for CHARNIECE, VENTURINO (MRN [id number]) as of 08/03/2019 19:18  Ref. Range 03/20/2019 10:03 04/21/2019 10:28 04/28/2019 14:15 05/05/2019 11:10 05/12/2019 12:16 05/19/2019 11:52 06/05/2019 09:47 06/12/2019 08:54 06/20/2019 08:53 07/01/2019 11:33 07/08/2019 08:38 07/14/2019 09:03 07/21/2019 09:05 07/28/2019 08:46  Retic Count, Absolute Latest Ref Range: 19.0 - 186.0 K/uL 94.7 60.3 54.5 63.4 92.9 208.5 (H) 110.5 54.8 100.1 80.8 65.9 113.7 76.9 93.4    REVIEW OF SYSTEMS: [name] fell 2 days ago.  She says that her Lasix has been increased and amlodipine added.  She felt extremely tired, sat down in the kitchen, fell asleep, and fell.  She hit her head above the left eye.  She has a bruise there.  She had a mild headache, but no longer, no visual changes, no dizziness, no nausea or vomiting, no confusion.  A detailed review of systems today was otherwise stable   HISTORY OF CURRENT ILLNESS: From the original consult note:  [name] is an 78 y.o. female from [place], [place].  She has a past medical history significant forPAD s/p stenting; moderate to severe pulmonary HTN; afib and AVR on Coumadin; HTN; HLD; DM; and CAD s/p CABG. the patient was admitted to the hospital for anemia.  She was having increasing fatigue and shortness of breath and had routine lab work drawn which showed a hemoglobin of 6.6.  Due to her abnormal lab work, she was advised to come to the emergency room for admission.  She was also admitted  in February 2020 for symptomatic anemia.  She reports she has been anemic for at least a year and has been taking oral iron.  She underwent a colonoscopy and EGD on 01/22/2018.  The colonoscopy showed diverticulosis in the sigmoid colon and descending colon, internal hemorrhoids.  The upper endoscopy was normal except for erythematous mucosa in the antrum.  She had  a repeat EGD on 11/21/2018 that showed a few minimally oozing( with scope trauma) superficial gastric ulcers with pigmented material were found in the gastric body. The largest lesion was 4 mm in largest dimension.  She underwent another upper endoscopy on 02/27/2019 which showed patchy mildly friable mucosa with contact bleeding was found in the gastric fundus, in the gastric body and in the gastric antrum.  The patient is maintained on warfarin due to A. fib and a mechanical aortic valve.  Review of the patient's chart shows that she has been anemic dating back to at least December thousand 12 (these are the earliest labs available to me).  Her highest hemoglobin was 11.6 in December 2012.  I also note that the patient has had intermittent mild leukopenia adding back to at least April 2019.  Her lowest white blood cell count was 2.6 in April 2019.  Since admission, the patient has received 1 unit of packed red blood cells.  She is currently on heparin for her procedure with plan to bridge back to Coumadin.  Stool for occult blood was negative x1 on 02/24/2019.  Hematology was asked see the patient to make recommendations regarding her anemia.  [name] subsequent history is as detailed below.   PAST MEDICAL HISTORY: Past Medical History:  Diagnosis Date   Aortic atherosclerosis ([place]) 01/23/2018   Atrial fibrillation ([facility])    Carotid artery disease ([place])    Cholelithiasis 01/23/2018   Chronic anticoagulation    Coronary artery disease    status post coronary artery bypass grafting times 07/10/2004   Diabetes mellitus    Hypercholesteremia    Hypertension    Mechanical heart valve present    H. aortic valve replacement at the time of bypass surgery October 2005   Moderate to severe pulmonary hypertension ([place])    Peripheral arterial disease ([place])    history of left common iliac artery PTA and stenting for a chronic total occlusion 08/26/01     PAST SURGICAL HISTORY: Past  Surgical History:  Procedure Laterality Date   AORTIC VALVE REPLACEMENT     CARDIAC CATHETERIZATION  11/10/2004   40% right common illiac, 70% in stent restenosis of distal left common illiac,    CARDIAC CATHETERIZATION  05/18/2004   LAD 50-70% midstenosis, RCA dominant w/50% stenosis, 50% Right common Illiac artery ostial stenosis, 90% in stent restenosis within midportion of left common illiac stent   Carotid Duplex  03/12/2012   RSA-elev. velocities suggestive of a 50-69% diameter reduction, Right&Left Bulb/Prox ICA-mild-mod.fibrous plaqueelevating Velocities abnormal study.   CHOLECYSTECTOMY N/A 03/01/2018   Procedure: LAPAROSCOPIC CHOLECYSTECTOMY;  Surgeon: Judeth Horn, MD;  Location: [facility];  Service: General;  Laterality: N/A;   COLONOSCOPY WITH PROPOFOL N/A 01/22/2018   Procedure: COLONOSCOPY WITH PROPOFOL;  Surgeon: Wilford Corner, MD;  Location: [facility];  Service: Endoscopy;  Laterality: N/A;   ESOPHAGOGASTRODUODENOSCOPY (EGD) WITH PROPOFOL N/A 01/22/2018   Procedure: ESOPHAGOGASTRODUODENOSCOPY (EGD) WITH PROPOFOL;  Surgeon: Wilford Corner, MD;  Location: [facility];  Service: Endoscopy;  Laterality: N/A;   ESOPHAGOGASTRODUODENOSCOPY (EGD) WITH PROPOFOL N/A 11/21/2018   Procedure: ESOPHAGOGASTRODUODENOSCOPY (EGD) WITH PROPOFOL;  Surgeon: Therisa Doyne,  Megan Salon, MD;  Location: [facility];  Service: Gastroenterology;  Laterality: N/A;   ESOPHAGOGASTRODUODENOSCOPY (EGD) WITH PROPOFOL Left 02/27/2019   Procedure: ESOPHAGOGASTRODUODENOSCOPY (EGD) WITH PROPOFOL;  Surgeon: Arta Silence, MD;  Location: [facility];  Service: Endoscopy;  Laterality: Left;   GIVENS CAPSULE STUDY N/A 11/21/2018   Procedure: GIVENS CAPSULE STUDY;  Surgeon: [name], MD;  Location: [facility];  Service: Gastroenterology;  Laterality: N/A;  To be deployed during EGD   Lower Ext. Duplex  03/12/2012   Right Proximal CIA- vessel narrowing w/elevated velocities 0-49% diameter reduction. Right SFA-mild  mixed density plaque throughout vessel.   NM MYOCAR PERF WALL MOTION  05/19/2010   protocol: Persantine, post stress EF 65%, negative for ischemia, low risk scan   RIGHT HEART CATH N/A 06/27/2018   Procedure: RIGHT HEART CATH;  Surgeon: [name], MD;  Location: [facility] LAB;  Service: Cardiovascular;  Laterality: N/A;   TRANSTHORACIC ECHOCARDIOGRAM  08/29/2012   Moderately calcified annulus of mitral valve, moderate regurg. of both mitral valve and tricuspid valve.     FAMILY HISTORY: Family History  Problem Relation Age of Onset   Breast cancer Neg Hx     GYNECOLOGIC HISTORY:  No LMP recorded. Patient has had a hysterectomy. Menarche:  years old Age at first live birth:  [place] P: 3 LMP:  Contraceptive:  HRT:   Hysterectomy?: yes BSO?:    SOCIAL HISTORY: (Current as of 03/07/2019) [name] is widowed.  She lives by herself.  She has 3 children that live locally.  She smoked three quarters of a pack of cigarettes daily for 50 years.  She quit in 2005.  Denies alcohol use.   ADVANCED DIRECTIVES: Not in place    HEALTH MAINTENANCE: Social History   Tobacco Use   Smoking status: Former Smoker    Packs/day: 1.00    Years: 30.00    Pack years: 30.00    Types: Cigarettes   Smokeless tobacco: Never Used   Tobacco comment: quit smoking 2005  Substance Use Topics   Alcohol use: No    Alcohol/week: 0.0 standard drinks   Drug use: No    Colonoscopy: EGD on 02/27/2019 under Dr. Paulita Fujita found some friable gastric mucosa.  Colonoscopy under Dr. Michail Sermon 01/22/2018 showed significant diverticular disease  PAP:   Bone density:  Mammography: 02/25/2017, breast density category B.  Allergies  Allergen Reactions   Flagyl [Metronidazole] Rash    ALL-OVER BODY RASH   Coreg [Carvedilol] Other (See Comments)    Terrible cramping in the feet and had a lot of bowel movements, but not diarrhea   Losartan Swelling    Patient doesn't recall site of swelling    Verapamil Hives   Zetia [Ezetimibe] Other (See Comments)    Reaction not recalled   Zocor [Simvastatin - High Dose] Other (See Comments)    Reaction not recalled    Current Outpatient Medications  Medication Sig Dispense Refill   acetaminophen (TYLENOL) 325 MG tablet Take 650 mg by mouth every 6 (six) hours as needed for mild pain.      allopurinol (ZYLOPRIM) 100 MG tablet Take 100 mg by mouth daily.     ALPRAZolam (XANAX) 0.5 MG tablet Take 0.25 mg by mouth at bedtime.      amLODipine (NORVASC) 5 MG tablet Take 1 tablet (5 mg total) by mouth daily. 30 tablet 3   atorvastatin (LIPITOR) 80 MG tablet Take 1 tablet (80 mg total) by mouth daily at 6 PM.  Cholecalciferol (VITAMIN D3) 1000 UNITS CAPS Take 2,000 Units by mouth daily with lunch.      cloNIDine (CATAPRES) 0.2 MG tablet Take 1 tablet (0.2 mg total) by mouth 2 (two) times daily. 60 tablet 3   Cyanocobalamin (VITAMIN B12) 1000 MCG TBCR Take 1,000 mcg by mouth daily with lunch.      feeding supplement, GLUCERNA SHAKE, (GLUCERNA SHAKE) LIQD Take 237 mLs by mouth 2 (two) times daily between meals. 237 mL 30   fluconazole (DIFLUCAN) 200 MG tablet Take 1 tablet (200 mg total) by mouth daily. 5 tablet 2   furosemide (LASIX) 40 MG tablet Take 2 tablets (80 mg total) by mouth 2 (two) times daily. 120 tablet 3   hydrALAZINE (APRESOLINE) 100 MG tablet Take 1 tablet (100 mg total) by mouth 3 (three) times daily. 270 tablet 1   Magnesium 500 MG TABS Take 500 mg by mouth daily.     magnesium oxide (MAG-OX) 400 (241.3 Mg) MG tablet Take 1 tablet (400 mg total) by mouth daily.     metFORMIN (GLUCOPHAGE) 500 MG tablet Take 500 mg by mouth 2 (two) times daily with a meal.      metoprolol tartrate (LOPRESSOR) 25 MG tablet Take 0.5 tablets (12.5 mg total) by mouth 2 (two) times daily. 60 tablet 0   ondansetron (ZOFRAN ODT) 4 MG disintegrating tablet Take 1 tablet (4 mg total) by mouth every 8 (eight) hours as needed for nausea or  vomiting. 6 tablet 0   pantoprazole (PROTONIX) 40 MG tablet Take 40 mg by mouth 2 (two) times daily.      predniSONE (DELTASONE) 5 MG tablet Take 2 tablets (10 mg total) by mouth daily with breakfast. 90 tablet 2   spironolactone (ALDACTONE) 25 MG tablet Take 1 tablet (25 mg total) by mouth daily. 90 tablet 3   tadalafil, PAH, (ADCIRCA) 20 MG tablet Take 2 tablets (40 mg total) by mouth daily. 60 tablet 6   traMADol (ULTRAM) 50 MG tablet Take 1 tablet (50 mg total) by mouth every 6 (six) hours as needed. 15 tablet 0   vitamin C (ASCORBIC ACID) 500 MG tablet Take 500 mg by mouth daily with lunch.     warfarin (COUMADIN) 5 MG tablet Take 5-7.5 mg by mouth daily. Take 1.5 tablets (7.5 mg) on Tues, Thursday, Saturday and Sunday. Take 1 tablet (5mg ) on Mondays, Wednesdays, and Fridays.     No current facility-administered medications for this visit.      OBJECTIVE: older white woman examined in a wheelchair There were no vitals filed for this visit. Wt Readings from Last 3 Encounters:  09/11/19 122 lb 9.6 oz (55.6 kg)  08/18/19 124 lb 4.8 oz (56.4 kg)  08/04/19 122 lb 14.4 oz (55.7 kg)   There is no height or weight on file to calculate BMI.    ECOG FS:2 - Symptomatic, <50% confined to bed  Sclerae unicteric, EOMs intact, pupils equal round and reactive  Mild ecchymosis left supraorbital area Wearing a mask No cervical or supraclavicular adenopathy Lungs no rales or rhonchi Heart regular rate and rhythm Abd soft, nontender, positive bowel sounds Breasts: Deferred   LAB RESULTS:  CMP     Component Value Date/Time   NA 135 09/11/2019 1219   NA 137 12/06/2017 1436   K 4.0 09/11/2019 1219   CL 103 09/11/2019 1219   CO2 22 09/11/2019 1219   GLUCOSE 190 (H) 09/11/2019 1219   BUN 58 (H) 09/11/2019 1219   BUN 20 12/06/2017  1436   CREATININE 1.53 (H) 09/11/2019 1219   CREATININE 1.64 (H) 09/01/2019 0916   CALCIUM 9.1 09/11/2019 1219   PROT 5.6 (L) 09/01/2019 0916   ALBUMIN  3.2 (L) 09/01/2019 0916   AST 24 09/01/2019 0916   ALT 13 09/01/2019 0916   ALKPHOS 66 09/01/2019 0916   BILITOT 0.5 09/01/2019 0916   GFRNONAA 32 (L) 09/11/2019 1219   GFRNONAA 29 (L) 09/01/2019 0916   GFRAA 37 (L) 09/11/2019 1219   GFRAA 34 (L) 09/01/2019 0916    Lab Results  Component Value Date   TOTALPROTELP 6.9 05/07/2019    No results found for: KPAFRELGTCHN, LAMBDASER, KAPLAMBRATIO  Lab Results  Component Value Date   WBC 4.5 09/01/2019   NEUTROABS 3.3 09/01/2019   HGB 11.0 (L) 09/01/2019   HCT 34.6 (L) 09/01/2019   MCV 86.1 09/01/2019   PLT 119 (L) 09/01/2019    No results found for: LABCA2  No components found for: [id number]  Recent Labs  Lab 09/11/19 1046  INR 1.7*    No results found for: LABCA2  No results found for: KGU542  No results found for: HCW237  No results found for: SEG315  No results found for: CA2729  No components found for: HGQUANT  No results found for: CEA1 / No results found for: CEA1   No results found for: AFPTUMOR  No results found for: CHROMOGRNA  No results found for: PSA1  No visits with results within 3 Day(s) from this visit.  Latest known visit with results is:  Hospital Outpatient Visit on 09/11/2019  Component Date Value Ref Range Status   Cholesterol 09/11/2019 127  0 - 200 mg/dL Final   Triglycerides 09/11/2019 180* <150 mg/dL Final   HDL 09/11/2019 31* >40 mg/dL Final   Total CHOL/HDL Ratio 09/11/2019 4.1  RATIO Final   VLDL 09/11/2019 36  0 - 40 mg/dL Final   LDL Cholesterol 09/11/2019 60  0 - 99 mg/dL Final   Comment:        Total Cholesterol/HDL:CHD Risk Coronary Heart Disease Risk Table                     Men   Women  1/2 Average Risk   3.4   3.3  Average Risk       5.0   4.4  2 X Average Risk   9.6   7.1  3 X Average Risk  23.4   11.0        Use the calculated Patient Ratio above and the CHD Risk Table to determine the patient's CHD Risk.        ATP III CLASSIFICATION (LDL):  <100      mg/dL   Optimal  100-129  mg/dL   Near or Above                    Optimal  130-159  mg/dL   Borderline  160-189  mg/dL   High  >190     mg/dL   Very High Performed at [facility] [address], [place], [place] [address]    Sodium 09/11/2019 135  135 - 145 mmol/L Final   Potassium 09/11/2019 4.0  3.5 - 5.1 mmol/L Final   Chloride 09/11/2019 103  98 - 111 mmol/L Final   CO2 09/11/2019 22  22 - 32 mmol/L Final   Glucose, Bld 09/11/2019 190* 70 - 99 mg/dL Final   BUN 09/11/2019 58* 8 - 23 mg/dL  Final   Creatinine, Ser 09/11/2019 1.53* 0.44 - 1.00 mg/dL Final   Calcium 09/11/2019 9.1  8.9 - 10.3 mg/dL Final   GFR calc non Af Amer 09/11/2019 32* >60 mL/min Final   GFR calc Af Amer 09/11/2019 37* >60 mL/min Final   Anion gap 09/11/2019 10  5 - 15 Final   Performed at [facility], [place] [address] [place], [place] [address]    (this displays the last labs from the last 3 days)  Lab Results  Component Value Date   TOTALPROTELP 6.9 05/07/2019   (this displays SPEP labs)  No results found for: KPAFRELGTCHN, LAMBDASER, KAPLAMBRATIO (kappa/lambda light chains)  No results found for: HGBA, HGBA2QUANT, HGBFQUANT, HGBSQUAN (Hemoglobinopathy evaluation)   Lab Results  Component Value Date   LDH 212 (H) 08/04/2019    Lab Results  Component Value Date   IRON 41 09/01/2019   TIBC 255 09/01/2019   IRONPCTSAT 16 (L) 09/01/2019   (Iron and TIBC)  Lab Results  Component Value Date   FERRITIN 155 09/01/2019    Urinalysis    Component Value Date/Time   COLORURINE YELLOW 07/07/2019 0209   APPEARANCEUR CLOUDY (A) 07/07/2019 0209   LABSPEC 1.011 07/07/2019 0209   PHURINE 6.0 07/07/2019 0209   GLUCOSEU NEGATIVE 07/07/2019 0209   HGBUR NEGATIVE 07/07/2019 0209   BILIRUBINUR NEGATIVE 07/07/2019 0209   KETONESUR NEGATIVE 07/07/2019 0209   PROTEINUR >=300 (A) 07/07/2019 0209   UROBILINOGEN 1.0 10/06/2011 1709   NITRITE NEGATIVE 07/07/2019 0209    LEUKOCYTESUR LARGE (A) 07/07/2019 0209     STUDIES:  No results found.   ELIGIBLE FOR AVAILABLE RESEARCH PROTOCOL: no   ASSESSMENT: 78 y.o. [place] woman with multifactorial anemia, as follows:             (a) anemia of chronic inflammation: As of May 2020 she has a SED rate of 95, CRP 1.1, positive ANA and RF; subsequently she was found to be ANCA positive             (b) hemolysis (mild): she has a mildly elevated LDH and a DAT positive for complement, not IgG; this is c/w (a) above             (c) anemia of renal insufficiency: inadequate EPO resonse to anemia              (d) GIB/ iron deficiency in patient on lifelong anticoagulaton  (1) Feraheme received 03/01/2019, repeated 03/20/2019   (a) reticulocyte 03/07/2019 up from 43.4 a year ago to 191.8 after iron infusion  (b) feraheme repeated on 05/12/2019  (c) subsequent ferritin levels >100  (2) darbepoetin: starting 05/05/2019 at 160mcg given every 2 weeks  (a) dose increased to 200 mcg every week starting 07/01/2019   (b) back to every 2 weeks beginning with 08/04/2019 dose  (3) ANCA positive vasculitis: diagnosed 05/08/2019 (titer 1:640)  (a) prednisone 40 mg daily started 05/31/2019, on taper  (b) rituximab weekly started 06/12/2019 (received test dose 06/05/2019   (i) hepatitis B sAg, core Ab and HIV negative 05/08/2019   (ii) rituximab held after 07/14/2019 dose (received 5 doses) with neutropenia   (iii) rituximab resumed 08/18/2019   (4) prednisone tapered to off as of 09/15/2019  (a) osteopenia with a T score of -1.5 on bone density 02/28/2016 at the breast center    PLAN: Illona's hemoglobin continues to be greater than 10 and her platelet count today is normal at 175.  Her white cells are 3.7 with  an absolute neutrophil count of 2.5.  We are going off the prednisone.  She is continuing the right toxin and the Retacrit.  If she remains stable we may be able to move the Retacrit to every 4 weeks.  I suspect  the reason for her feeling tired over the weekend was the changes in her blood pressure medications and I have asked her to make sure to call Dr. Aundra Dubin to let them know how she is doing.  Today however she feels fine.  Despite the fall and her being on anticoagulation there are no symptoms suggestive of a CNS bleed.  She will return in 2 weeks for labs and her Retacrit and then in 4 weeks for her next rituximab treatment and I will see him at that point.  She knows to call for any other issues that may develop before that visit.  [name]. Dyneisha Murchison, MD  09/15/19 8:05 AM Medical Oncology and Hematology [facility] [place], [place] [address] Tel. [phone]    Fax. [phone]   I, Wilburn Mylar, am acting as scribe for Dr. Virgie [name]. Hilde Churchman.  I, Lurline Del MD, have reviewed the above documentation for accuracy and completeness, and I agree with the above.

## 2019-09-15 ENCOUNTER — Inpatient Hospital Stay: Payer: Medicare HMO | Attending: Oncology | Admitting: Oncology

## 2019-09-15 ENCOUNTER — Other Ambulatory Visit: Payer: Self-pay | Admitting: *Deleted

## 2019-09-15 ENCOUNTER — Ambulatory Visit: Payer: Medicare HMO

## 2019-09-15 ENCOUNTER — Other Ambulatory Visit: Payer: Self-pay

## 2019-09-15 ENCOUNTER — Inpatient Hospital Stay: Payer: Medicare HMO

## 2019-09-15 VITALS — BP 129/45 | HR 53 | Temp 98.2°F | Resp 16 | Ht 62.0 in | Wt 121.9 lb

## 2019-09-15 VITALS — BP 158/79 | HR 52 | Temp 98.2°F | Resp 16

## 2019-09-15 DIAGNOSIS — M8580 Other specified disorders of bone density and structure, unspecified site: Secondary | ICD-10-CM | POA: Insufficient documentation

## 2019-09-15 DIAGNOSIS — D696 Thrombocytopenia, unspecified: Secondary | ICD-10-CM

## 2019-09-15 DIAGNOSIS — D508 Other iron deficiency anemias: Secondary | ICD-10-CM

## 2019-09-15 DIAGNOSIS — D594 Other nonautoimmune hemolytic anemias: Secondary | ICD-10-CM

## 2019-09-15 DIAGNOSIS — D500 Iron deficiency anemia secondary to blood loss (chronic): Secondary | ICD-10-CM

## 2019-09-15 DIAGNOSIS — I776 Arteritis, unspecified: Secondary | ICD-10-CM

## 2019-09-15 DIAGNOSIS — Z5112 Encounter for antineoplastic immunotherapy: Secondary | ICD-10-CM | POA: Diagnosis not present

## 2019-09-15 DIAGNOSIS — Z952 Presence of prosthetic heart valve: Secondary | ICD-10-CM

## 2019-09-15 DIAGNOSIS — I739 Peripheral vascular disease, unspecified: Secondary | ICD-10-CM

## 2019-09-15 DIAGNOSIS — I7782 Antineutrophilic cytoplasmic antibody (ANCA) vasculitis: Secondary | ICD-10-CM

## 2019-09-15 DIAGNOSIS — D61818 Other pancytopenia: Secondary | ICD-10-CM

## 2019-09-15 DIAGNOSIS — N189 Chronic kidney disease, unspecified: Secondary | ICD-10-CM | POA: Diagnosis not present

## 2019-09-15 DIAGNOSIS — D631 Anemia in chronic kidney disease: Secondary | ICD-10-CM | POA: Diagnosis not present

## 2019-09-15 LAB — CMP (CANCER CENTER ONLY)
ALT: 25 U/L (ref 0–44)
AST: 37 U/L (ref 15–41)
Albumin: 3 g/dL — ABNORMAL LOW (ref 3.5–5.0)
Alkaline Phosphatase: 88 U/L (ref 38–126)
Anion gap: 11 (ref 5–15)
BUN: 72 mg/dL — ABNORMAL HIGH (ref 8–23)
CO2: 20 mmol/L — ABNORMAL LOW (ref 22–32)
Calcium: 8.5 mg/dL — ABNORMAL LOW (ref 8.9–10.3)
Chloride: 102 mmol/L (ref 98–111)
Creatinine: 1.98 mg/dL — ABNORMAL HIGH (ref 0.44–1.00)
GFR, Est AFR Am: 27 mL/min — ABNORMAL LOW (ref >60)
GFR, Estimated: 23 mL/min — ABNORMAL LOW (ref >60)
Glucose, Bld: 254 mg/dL — ABNORMAL HIGH (ref 70–99)
Potassium: 3.9 mmol/L (ref 3.5–5.1)
Sodium: 133 mmol/L — ABNORMAL LOW (ref 135–145)
Total Bilirubin: 0.4 mg/dL (ref 0.3–1.2)
Total Protein: 5.9 g/dL — ABNORMAL LOW (ref 6.5–8.1)

## 2019-09-15 LAB — CBC WITH DIFFERENTIAL/PLATELET
Abs Immature Granulocytes: 0.03 10*3/uL (ref 0.00–0.07)
Basophils Absolute: 0 10*3/uL (ref 0.0–0.1)
Basophils Relative: 1 %
Eosinophils Absolute: 0 10*3/uL (ref 0.0–0.5)
Eosinophils Relative: 1 %
HCT: 33.3 % — ABNORMAL LOW (ref 36.0–46.0)
Hemoglobin: 10.6 g/dL — ABNORMAL LOW (ref 12.0–15.0)
Immature Granulocytes: 1 %
Lymphocytes Relative: 16 %
Lymphs Abs: 0.6 10*3/uL — ABNORMAL LOW (ref 0.7–4.0)
MCH: 27 pg (ref 26.0–34.0)
MCHC: 31.8 g/dL (ref 30.0–36.0)
MCV: 84.7 fL (ref 80.0–100.0)
Monocytes Absolute: 0.5 10*3/uL (ref 0.1–1.0)
Monocytes Relative: 13 %
Neutro Abs: 2.5 10*3/uL (ref 1.7–7.7)
Neutrophils Relative %: 68 %
Platelets: 175 10*3/uL (ref 150–400)
RBC: 3.93 MIL/uL (ref 3.87–5.11)
RDW: 17.1 % — ABNORMAL HIGH (ref 11.5–15.5)
WBC: 3.7 10*3/uL — ABNORMAL LOW (ref 4.0–10.5)
nRBC: 0 % (ref 0.0–0.2)

## 2019-09-15 LAB — RETICULOCYTES
Immature Retic Fract: 11.6 % (ref 2.3–15.9)
RBC.: 3.89 MIL/uL (ref 3.87–5.11)
Retic Count, Absolute: 55.6 10*3/uL (ref 19.0–186.0)
Retic Ct Pct: 1.4 % (ref 0.4–3.1)

## 2019-09-15 LAB — SAVE SMEAR(SSMR), FOR PROVIDER SLIDE REVIEW

## 2019-09-15 LAB — IRON AND TIBC
Iron: 63 ug/dL (ref 41–142)
Saturation Ratios: 27 % (ref 21–57)
TIBC: 235 ug/dL — ABNORMAL LOW (ref 236–444)
UIBC: 172 ug/dL (ref 120–384)

## 2019-09-15 LAB — FERRITIN: Ferritin: 279 ng/mL (ref 11–307)

## 2019-09-15 MED ORDER — EPOETIN ALFA-EPBX 40000 UNIT/ML IJ SOLN
INTRAMUSCULAR | Status: AC
  Filled 2019-09-15: qty 1

## 2019-09-15 MED ORDER — DIPHENHYDRAMINE HCL 25 MG PO CAPS
25.0000 mg | ORAL_CAPSULE | Freq: Once | ORAL | Status: AC
  Administered 2019-09-15: 25 mg via ORAL

## 2019-09-15 MED ORDER — DIPHENHYDRAMINE HCL 25 MG PO CAPS
ORAL_CAPSULE | ORAL | Status: AC
  Filled 2019-09-15: qty 1

## 2019-09-15 MED ORDER — EPOETIN ALFA-EPBX 40000 UNIT/ML IJ SOLN
40000.0000 [IU] | Freq: Once | INTRAMUSCULAR | Status: AC
  Administered 2019-09-15: 40000 [IU] via SUBCUTANEOUS

## 2019-09-15 MED ORDER — SODIUM CHLORIDE 0.9 % IV SOLN
375.0000 mg/m2 | Freq: Once | INTRAVENOUS | Status: AC
  Administered 2019-09-15: 600 mg via INTRAVENOUS
  Filled 2019-09-15: qty 50

## 2019-09-15 MED ORDER — ACETAMINOPHEN 325 MG PO TABS
650.0000 mg | ORAL_TABLET | Freq: Once | ORAL | Status: AC
  Administered 2019-09-15: 650 mg via ORAL

## 2019-09-15 MED ORDER — ACETAMINOPHEN 325 MG PO TABS
ORAL_TABLET | ORAL | Status: AC
  Filled 2019-09-15: qty 2

## 2019-09-15 MED ORDER — SODIUM CHLORIDE 0.9 % IV SOLN
Freq: Once | INTRAVENOUS | Status: AC
  Administered 2019-09-15: 12:00:00 via INTRAVENOUS
  Filled 2019-09-15: qty 250

## 2019-09-15 NOTE — Patient Instructions (Signed)
[facility] Discharge Instructions for Patients Receiving Chemotherapy  Today you received the following chemotherapy agents Rituximab (RITUXAN).  To help prevent nausea and vomiting after your treatment, we encourage you to take your nausea medication as prescribed.   If you develop nausea and vomiting that is not controlled by your nausea medication, call the clinic.   BELOW ARE SYMPTOMS THAT SHOULD BE REPORTED IMMEDIATELY:  *FEVER GREATER THAN 100.5 F  *CHILLS WITH OR WITHOUT FEVER  NAUSEA AND VOMITING THAT IS NOT CONTROLLED WITH YOUR NAUSEA MEDICATION  *UNUSUAL SHORTNESS OF BREATH  *UNUSUAL BRUISING OR BLEEDING  TENDERNESS IN MOUTH AND THROAT WITH OR WITHOUT PRESENCE OF ULCERS  *URINARY PROBLEMS  *BOWEL PROBLEMS  UNUSUAL RASH Items with * indicate a potential emergency and should be followed up as soon as possible.  Feel free to call the clinic should you have any questions or concerns. The clinic phone number is [phone].  Please show the [place] at check-in to the Emergency Department and triage nurse.  Darbepoetin Alfa injection What is this medicine? DARBEPOETIN ALFA (dar be POE e tin AL fa) helps your body make more red blood cells. It is used to treat anemia caused by chronic kidney failure and chemotherapy. This medicine may be used for other purposes; ask your health care provider or pharmacist if you have questions. COMMON BRAND NAME(S): Aranesp What should I tell my health care provider before I take this medicine? They need to know if you have any of these conditions:  blood clotting disorders or history of blood clots  cancer patient not on chemotherapy  cystic fibrosis  heart disease, such as angina, heart failure, or a history of a heart attack  hemoglobin level of 12 g/dL or greater  high blood pressure  low levels of folate, iron, or vitamin B12  seizures  an unusual or allergic reaction to darbepoetin,  erythropoietin, albumin, hamster proteins, latex, other medicines, foods, dyes, or preservatives  pregnant or trying to get pregnant  breast-feeding How should I use this medicine? This medicine is for injection into a vein or under the skin. It is usually given by a health care professional in a hospital or clinic setting. If you get this medicine at home, you will be taught how to prepare and give this medicine. Use exactly as directed. Take your medicine at regular intervals. Do not take your medicine more often than directed. It is important that you put your used needles and syringes in a special sharps container. Do not put them in a trash can. If you do not have a sharps container, call your pharmacist or healthcare provider to get one. A special MedGuide will be given to you by the pharmacist with each prescription and refill. Be sure to read this information carefully each time. Talk to your pediatrician regarding the use of this medicine in children. While this medicine may be used in children as young as 60 month of age for selected conditions, precautions do apply. Overdosage: If you think you have taken too much of this medicine contact a poison control center or emergency room at once. NOTE: This medicine is only for you. Do not share this medicine with others. What if I miss a dose? If you miss a dose, take it as soon as you can. If it is almost time for your next dose, take only that dose. Do not take double or extra doses. What may interact with this medicine? Do not take this  medicine with any of the following medications:  epoetin alfa This list may not describe all possible interactions. Give your health care provider a list of all the medicines, herbs, non-prescription drugs, or dietary supplements you use. Also tell them if you smoke, drink alcohol, or use illegal drugs. Some items may interact with your medicine. What should I watch for while using this medicine? Your  condition will be monitored carefully while you are receiving this medicine. You may need blood work done while you are taking this medicine. This medicine may cause a decrease in vitamin B6. You should make sure that you get enough vitamin B6 while you are taking this medicine. Discuss the foods you eat and the vitamins you take with your health care professional. What side effects may I notice from receiving this medicine? Side effects that you should report to your doctor or health care professional as soon as possible:  allergic reactions like skin rash, itching or hives, swelling of the face, lips, or tongue  breathing problems  changes in vision  chest pain  confusion, trouble speaking or understanding  feeling faint or lightheaded, falls  high blood pressure  muscle aches or pains  pain, swelling, warmth in the leg  rapid weight gain  severe headaches  sudden numbness or weakness of the face, arm or leg  trouble walking, dizziness, loss of balance or coordination  seizures (convulsions)  swelling of the ankles, feet, hands  unusually weak or tired Side effects that usually do not require medical attention (report to your doctor or health care professional if they continue or are bothersome):  diarrhea  fever, chills (flu-like symptoms)  headaches  nausea, vomiting  redness, stinging, or swelling at site where injected This list may not describe all possible side effects. Call your doctor for medical advice about side effects. You may report side effects to FDA at 1-800-FDA-1088. Where should I keep my medicine? Keep out of the reach of children. Store in a refrigerator between 2 and 8 degrees C (36 and 46 degrees F). Do not freeze. Do not shake. Throw away any unused portion if using a single-dose vial. Throw away any unused medicine after the expiration date. NOTE: This sheet is a summary. It may not cover all possible information. If you have questions  about this medicine, talk to your doctor, pharmacist, or health care provider.  2020 Elsevier/Gold Standard (2017-10-10 16:44:20)

## 2019-09-17 ENCOUNTER — Other Ambulatory Visit: Payer: Self-pay | Admitting: Pharmacist

## 2019-09-17 ENCOUNTER — Telehealth (HOSPITAL_COMMUNITY): Payer: Self-pay

## 2019-09-17 ENCOUNTER — Ambulatory Visit: Payer: Self-pay | Admitting: Pharmacist

## 2019-09-17 NOTE — Telephone Encounter (Signed)
Returned call to patient.  She left message on vm stating she havent fell well in several days. When asked to describe how shes been feeling she reports since seeing Dr last Thursday she has been very tired, sleepy and mild nausea intermittently.  She reports on Saturday she fell asleep at dinner table and fell out of chart hitting her head. Pt on coumadin, did not call ems. She said she felt fine no dizziness, headaches or vision changes.  She did not want to be evaluated further. She states she saw her oncologist on 09/15/19.  She reports she feels much better this morning.  Advised to continue monitoring for headache, dizziness or change in vision. She states her BP in doctors office was 129/45, HR 50's.  Her BP cuff at home not working. Pt to call office for any further complaints.  Verbalized understanding.

## 2019-09-17 NOTE — Patient Outreach (Signed)
[facility]) Care Management  09/17/2019  [name] 1941/07/28 [id number]  Called patient to follow up with her. Unfortunately, she did not answer her phone. HIPAA compliant message was left on her voicemail. From chart review, patient called her Cardiologist's office today and said she was not feeling well. She reported fatigue, mild nausea, and that she fell asleep at the dinner table over the weekend and fell out of her chair.   Patient was triaged and advised to continue monitoring for headache, dizziness or vision changes.   Plan: Call patient back in 3-5 business days.  [name], PharmD, [place] Clinical Pharmacist [phone]

## 2019-09-19 ENCOUNTER — Telehealth: Payer: Self-pay | Admitting: *Deleted

## 2019-09-19 NOTE — Telephone Encounter (Signed)
This RN spoke with pt per her call stating she " has a sore tongue ".  She denies any mouth sores or open areas,. She does not see any white patches.  She does state her throat feels a little sore too.  She states she had same symptoms previously and was prescribed some pills - which affected her INR.  This RN discussed possible use of MMW - and contacted her local pharmacy which does not carry or compound this medication.  Per further discussion with pt- she would prefer to try OTC remedies. She is currently using salt water rinses.  This RN discussed OTC mouthwashes but also use of coconut oil.  [name] will try the above and call again if needed.

## 2019-09-22 ENCOUNTER — Other Ambulatory Visit (HOSPITAL_COMMUNITY): Payer: Medicare HMO

## 2019-09-22 ENCOUNTER — Other Ambulatory Visit: Payer: Self-pay | Admitting: Pharmacist

## 2019-09-22 NOTE — Progress Notes (Signed)
Counseling Intern spoke to patient via phone on 09/22/19 for a counseling session.  Counseling intern provided therapeutic listening, empathy, compassion, and reflected patient's emotional response to her cancer experience. Pt stated she still feels extremely fatigued despite her lab work showing normal numbers. Pt reports feeling "sad" that she doesn't have the energy to do the things she loves like decorating her house for Christmas or making big Christmas dinner. Pt reports she will not be able to have the big Christmas dinner that she usually hosts. Pt and counseling intern discussed that she is grieving the loss of these experiences that she values. Pt reports feeling lonely and bored by the monotony of her life at home alone. Pt stated that she did put up her artifical tree with the lights and that "lights up her life". Pt and counseling intern discussed other things that bring her joy. Pt stated she will aim to do several things that bring her joy such as baking pies for Christmas and making the dumplings for her family's chicken and dumplings. Pt stated she will plan to spread the activities over many days to ensure she has the energy. Pt stated that she finds the counseling sessions beneficial as a space where she can "let out" her feelings in a non-judgemental space.   Next Counseling Appt: 10/15/19 at 10:00am via phone   [facility] Counseling Intern Voicemail:  [phone]

## 2019-09-22 NOTE — Patient Outreach (Signed)
[name]) Care Management  09/22/2019  [name] 01-11-1941 [id number]   Patient was called to follow up. HIPAA identifiers were obtained.  Patient reported feeling "ok".  From chart review, she called her provider with complaints of feeling tired and that she fell out of a chair at the dinner table and hit her head.  Patient reported feeling "ok" from the fall and not in any distress. She did say her face was bruised. She is on warfarin and will go back to the Anticoagulation clinic at the end of this week. Last INR 1.7 INR goal (2.5-3.5).  Her dose was increased in 2 weeks ago to 5mg  daily except for 7.5mg  on Monday, Wednesday, and Friday.  She reported taking all medications as prescribed.   Medications Reviewed Today    Reviewed by [name], [facility] (Pharmacist) on 09/22/19 at 1014  Med List Status: <None>  Medication Order Taking? Sig Documenting Provider Last Dose Status Informant  acetaminophen (TYLENOL) 325 MG tablet [id number] Yes Take 650 mg by mouth every 6 (six) hours as needed for mild pain.  [provider] Taking Active Self  allopurinol (ZYLOPRIM) 100 MG tablet [id number] Yes Take 100 mg by mouth daily. [provider] Taking Active Self  ALPRAZolam (XANAX) 0.5 MG tablet [id number] Yes Take 0.25 mg by mouth at bedtime.  [provider] Taking Active Self           Med Note [name], [name]   Thu Sep 11, 2019 12:02 PM)    amLODipine (NORVASC) 5 MG tablet [id number] Yes Take 1 tablet (5 mg total) by mouth daily. [name], MD Taking Active   atorvastatin (LIPITOR) 80 MG tablet [id number] Yes Take 1 tablet (80 mg total) by mouth daily at 6 PM. [name], MD Taking Active Self  Cholecalciferol (VITAMIN D3) 1000 UNITS CAPS [id number] Yes Take 2,000 Units by mouth daily with lunch.  [provider] Taking Active Self           Med Note [name]   Fri Jun 27, 2019 12:31 PM)    cloNIDine (CATAPRES) 0.2 MG  tablet [id number] Yes Take 1 tablet (0.2 mg total) by mouth 2 (two) times daily. [name], MD Taking Active   Cyanocobalamin (VITAMIN B12) 1000 MCG TBCR [id number] Yes Take 1,000 mcg by mouth daily with lunch.  [provider] Taking Active Self  feeding supplement, [place], ([place]) LIQD [id number] Yes Take 237 mLs by mouth 2 (two) times daily between meals. [name], MD Taking Active Self           Med Note [facility], [name]   Thu Sep 11, 2019 12:02 PM)    fluconazole (DIFLUCAN) 200 MG tablet [id number] Yes Take 1 tablet (200 mg total) by mouth daily. Gardenia Phlegm, NP Taking Active   furosemide (LASIX) 40 MG tablet [id number] Yes Take 2 tablets (80 mg total) by mouth 2 (two) times daily. [name], MD Taking Active   hydrALAZINE (APRESOLINE) 100 MG tablet [id number] Yes Take 1 tablet (100 mg total) by mouth 3 (three) times daily. [name], MD Taking Active Self  Magnesium 500 MG TABS [id number] Yes Take 500 mg by mouth daily. [provider] Taking Active Self  magnesium oxide (MAG-OX) 400 (241.3 Mg) MG tablet [id number] Yes Take 1 tablet (400 mg total) by mouth daily. [name], MD Taking Active Self  Med Note Synthia Innocent   Mon Jul 07, 2019  3:42 AM)    metFORMIN (GLUCOPHAGE) 500 MG tablet [id number] Yes Take 500 mg by mouth 2 (two) times daily with a meal.  [provider] Taking Active Self  metoprolol tartrate (LOPRESSOR) 25 MG tablet [id number] Yes Take 0.5 tablets (12.5 mg total) by mouth 2 (two) times daily. [place] Donnamarie Poag, DO Taking Active Self  ondansetron (ZOFRAN ODT) 4 MG disintegrating tablet [id number] Yes Take 1 tablet (4 mg total) by mouth every 8 (eight) hours as needed for nausea or vomiting. [name], [place] E, PA-C Taking Active Self           Med Note [name] Jul 07, 2019  3:49 AM)    pantoprazole (PROTONIX) 40 MG tablet [id number] Yes Take 40 mg by mouth 2 (two)  times daily.  [provider] Taking Active Self           Med Note [name]   Wed Jun 20, 2017 10:33 AM)    predniSONE (DELTASONE) 5 MG tablet [id number] Yes Take 2 tablets (10 mg total) by mouth daily with breakfast. Magrinat, [name], MD Taking Active   spironolactone (ALDACTONE) 25 MG tablet [id number] Yes Take 1 tablet (25 mg total) by mouth daily. [name], MD Taking Active   tadalafil, PAH, (ADCIRCA) 20 MG tablet [id number] Yes Take 2 tablets (40 mg total) by mouth daily. [name], MD Taking Active Self  traMADol [name]) 50 MG tablet [id number] Yes Take 1 tablet (50 mg total) by mouth every 6 (six) hours as needed. Magrinat, [name], MD Taking Active   vitamin C (ASCORBIC ACID) 500 MG tablet [id number] Yes Take 500 mg by mouth daily with lunch. [provider] Taking Active Self  warfarin (COUMADIN) 5 MG tablet [id number] Yes Take 5-7.5 mg by mouth daily. Take 1.5 tablets (7.5 mg) on Tues, Thursday, Saturday and Sunday. Take 1 tablet (5mg ) on Mondays, Wednesdays, and Fridays. [provider] Taking Active Self         Plan: Call patient back in 3-4 weeks. Consider closing case.  [name], PharmD, [place] Clinical Pharmacist [phone]

## 2019-09-23 ENCOUNTER — Other Ambulatory Visit (HOSPITAL_COMMUNITY): Payer: Self-pay

## 2019-09-23 ENCOUNTER — Other Ambulatory Visit (HOSPITAL_COMMUNITY): Payer: Medicare HMO

## 2019-09-23 ENCOUNTER — Other Ambulatory Visit: Payer: Self-pay

## 2019-09-23 DIAGNOSIS — Z952 Presence of prosthetic heart valve: Secondary | ICD-10-CM

## 2019-09-23 DIAGNOSIS — I5032 Chronic diastolic (congestive) heart failure: Secondary | ICD-10-CM

## 2019-09-23 NOTE — Patient Outreach (Signed)
[facility]) Care Management  09/23/2019  [name] April 14, 1941 [id number]   Telephone call to patient for monthly disease management follow up.  No answer.  HIPAA compliant voice message left.   Plan: RN CM will attempt patient again in the month of January and send letter.  [name], RN, MSN [place] Management Care Management Coordinator Direct Line [phone] Cell [phone] Toll Free: [phone]  Fax: [phone]

## 2019-09-24 ENCOUNTER — Telehealth: Payer: Self-pay | Admitting: Oncology

## 2019-09-24 NOTE — Telephone Encounter (Signed)
Returned patient's phone call regarding cancelling 12/21 appointment, per patient's request appointment has been cancelled.

## 2019-09-25 ENCOUNTER — Ambulatory Visit (INDEPENDENT_AMBULATORY_CARE_PROVIDER_SITE_OTHER): Payer: Medicare HMO | Admitting: Pharmacist

## 2019-09-25 ENCOUNTER — Other Ambulatory Visit: Payer: Self-pay

## 2019-09-25 DIAGNOSIS — Z7901 Long term (current) use of anticoagulants: Secondary | ICD-10-CM

## 2019-09-25 DIAGNOSIS — Z952 Presence of prosthetic heart valve: Secondary | ICD-10-CM

## 2019-09-25 LAB — POCT INR: INR: 2.1 (ref 2.0–3.0)

## 2019-09-26 ENCOUNTER — Ambulatory Visit (HOSPITAL_COMMUNITY)
Admission: RE | Admit: 2019-09-26 | Discharge: 2019-09-26 | Disposition: A | Discharge status: Home / Self Care | Payer: Medicare HMO | Source: Ambulatory Visit | Attending: Internal Medicine | Admitting: Internal Medicine

## 2019-09-26 DIAGNOSIS — I5032 Chronic diastolic (congestive) heart failure: Secondary | ICD-10-CM | POA: Insufficient documentation

## 2019-09-26 DIAGNOSIS — H6123 Impacted cerumen, bilateral: Secondary | ICD-10-CM | POA: Diagnosis not present

## 2019-09-26 LAB — BASIC METABOLIC PANEL
Anion gap: 10 (ref 5–15)
BUN: 63 mg/dL — ABNORMAL HIGH (ref 8–23)
CO2: 24 mmol/L (ref 22–32)
Calcium: 9.2 mg/dL (ref 8.9–10.3)
Chloride: 103 mmol/L (ref 98–111)
Creatinine, Ser: 1.56 mg/dL — ABNORMAL HIGH (ref 0.44–1.00)
GFR calc Af Amer: 36 mL/min — ABNORMAL LOW (ref >60)
GFR calc non Af Amer: 31 mL/min — ABNORMAL LOW (ref >60)
Glucose, Bld: 128 mg/dL — ABNORMAL HIGH (ref 70–99)
Potassium: 3.6 mmol/L (ref 3.5–5.1)
Sodium: 137 mmol/L (ref 135–145)

## 2019-09-28 ENCOUNTER — Other Ambulatory Visit: Payer: Self-pay | Admitting: Cardiovascular Disease

## 2019-09-29 ENCOUNTER — Ambulatory Visit: Payer: Medicare HMO

## 2019-09-29 NOTE — Telephone Encounter (Signed)
Refill Request.  

## 2019-09-30 ENCOUNTER — Telehealth (HOSPITAL_COMMUNITY): Payer: Self-pay | Admitting: Pharmacy Technician

## 2019-09-30 NOTE — Telephone Encounter (Signed)
Patient Advocate Encounter   Received notification from [facility] that prior authorization for Tadalafil is required.   PA submitted on CoverMyMeds Key  [id number] Status is pending   Will continue to follow.  Charlann Boxer, CPhT

## 2019-10-04 ENCOUNTER — Other Ambulatory Visit: Payer: Self-pay | Admitting: Cardiovascular Disease

## 2019-10-06 ENCOUNTER — Other Ambulatory Visit: Payer: Self-pay

## 2019-10-06 MED ORDER — METOPROLOL TARTRATE 25 MG PO TABS: 12.5000 mg | ORAL_TABLET | Freq: Two times a day (BID) | ORAL | 0 refills | Status: DC

## 2019-10-09 ENCOUNTER — Telehealth (HOSPITAL_COMMUNITY): Payer: Self-pay | Admitting: Pharmacist

## 2019-10-09 NOTE — Telephone Encounter (Signed)
Advanced Heart Failure Patient Advocate Encounter  Prior Authorization for tadalafil has been approved.    Effective dates: 10/09/19 through 10/08/20  Audry Riles, PharmD, BCPS, BCCP, CPP Heart Failure Clinic Pharmacist [phone]

## 2019-10-12 NOTE — Progress Notes (Signed)
[facility]  [phone] [phone]    ID: [name] DOB: 1941-02-04  MR#: [id number]  OMV#:[id number]  Patient Care Team: [name] Pao, MD as PCP - General (Internal Medicine) [name] Dresser, MD as PCP - Advanced Heart Failure (Cardiology) [name] Harp, MD as PCP - Cardiology (Cardiology) [name], [name] Dad, MD as Consulting Physician (Hematology and Oncology) [name] Silence, MD as Consulting Physician (Gastroenterology) [name] Dresser, MD as Consulting Physician (Cardiology) [name], DPM as Consulting Physician (Podiatry) [name] Pound, MD as Consulting Physician (Rheumatology) [name], [name] as [facility] Management (Pharmacist) [name] Billings, RN as [facility] Management OTHER MD:   CHIEF COMPLAINT: anemia and thrombocytopenia  CURRENT TREATMENT: [Feraheme], Retacrit, rituximab   INTERVAL HISTORY: [name] returns today for follow-up and treatment of her multifactorial anemia.  She continues on Retacrit for her anemia of renal insufficiency, currently at 200 mcg.  She had been receiving this every 14 days.  However she missed her dose on 09/29/2019.  She simply was not able to get here that day she says.  She is now 4 weeks out from her last dose and her hemoglobin is greater than 11 which means she also will not receive Retacrit today.  That actually is good news  She also receives rituximab, every 4 weeks, with a dose due today.  She had significant leukopenia from this medication but that has resolved.  She currently has no side effects from this that she is aware of.  [name] went off prednisone without difficulty at the last visit here, a month ago.  Her last blood transfusion was 05/27/2019. Her Hb has been trending upwards Lab Results  Component Value Date   HGB 11.3 (L) 10/13/2019   HGB 10.6 (L) 09/15/2019   HGB 11.0 (L) 09/01/2019   HGB 10.1 (L) 08/18/2019     HGB 10.7 (L) 08/04/2019   Her ferritin remains >100 after her last feraheme dose, 05/12/2019: Lab Results  Component Value Date   FERRITIN 279 09/15/2019   FERRITIN 155 09/01/2019   FERRITIN 266 08/18/2019   FERRITIN 290 08/04/2019   FERRITIN 167 07/28/2019    We are also following her reticulocyte count: Results for [name], [name] (MRN [id number]) as of 10/13/2019 10:35  Ref. Range 07/28/2019 08:46 08/04/2019 11:13 08/18/2019 10:42 09/01/2019 09:16 09/15/2019 10:23  Retic Count, Absolute Latest Ref Range: 19.0 - 186.0 K/uL 93.4 107.7 89.4 79.8 55.6    REVIEW OF SYSTEMS: [name] for the first time that I remember is not in a wheelchair.  She is using a walker.  She tells me she still has a balance problem and we did discuss fall precautions today.  She is doing some vacuuming, one room at a time.  She does her own cooking.  She does not do her shopping.  A neighbor brings her here.  She tells me her son has what sounds like superficial bladder cancer and is going to be getting what sounds like BCG installations over the next several weeks.  For Christmas she had her 3 children and 3 spouses there, but no children.  Aside from these issues a detailed review of systems today was stable   HISTORY OF CURRENT ILLNESS: From the original consult note:  [name] is an 79 y.o. female from [place], [place].  She has a past medical history significant forPAD s/p stenting; moderate to severe pulmonary HTN; afib and AVR on Coumadin; HTN; HLD; DM;  and CAD s/p CABG. the patient was admitted to the [name] for anemia.  She was having increasing fatigue and shortness of breath and had routine lab work drawn which showed a hemoglobin of 6.6.  Due to her abnormal lab work, she was advised to come to the emergency room for admission.  She was also admitted in February 2020 for symptomatic anemia.  She reports she has been anemic for at least a year and has been taking oral iron.  She underwent  a colonoscopy and EGD on 01/22/2018.  The colonoscopy showed diverticulosis in the sigmoid colon and descending colon, internal hemorrhoids.  The upper endoscopy was normal except for erythematous mucosa in the antrum.  She had a repeat EGD on 11/21/2018 that showed a few minimally oozing( with scope trauma) superficial gastric ulcers with pigmented material were found in the gastric body. The largest lesion was 4 mm in largest dimension.  She underwent another upper endoscopy on 02/27/2019 which showed patchy mildly friable mucosa with contact bleeding was found in the gastric fundus, in the gastric body and in the gastric antrum.  The patient is maintained on warfarin due to A. fib and a mechanical aortic valve.  Review of the patient's chart shows that she has been anemic dating back to at least December thousand 12 (these are the earliest labs available to me).  Her highest hemoglobin was 11.6 in December 2012.  I also note that the patient has had intermittent mild leukopenia adding back to at least April 2019.  Her lowest white blood cell count was 2.6 in April 2019.  Since admission, the patient has received 1 unit of packed red blood cells.  She is currently on heparin for her procedure with plan to bridge back to Coumadin.  Stool for occult blood was negative x1 on 02/24/2019.  Hematology was asked see the patient to make recommendations regarding her anemia.  [name] subsequent history is as detailed below.   PAST MEDICAL HISTORY: Past Medical History:  Diagnosis Date  . Aortic atherosclerosis ([facility]) 01/23/2018  . Atrial fibrillation ([facility])   . Carotid artery disease ([facility])   . Cholelithiasis 01/23/2018  . Chronic anticoagulation   . Coronary artery disease    status post coronary artery bypass grafting times 07/10/2004  . Diabetes mellitus   . Hypercholesteremia   . Hypertension   . Mechanical heart valve present    H. aortic valve replacement at the time of bypass surgery October 2005   . Moderate to severe pulmonary hypertension ([facility])   . Peripheral arterial disease ([facility])    history of left common iliac artery PTA and stenting for a chronic total occlusion 08/26/01     PAST SURGICAL HISTORY: Past Surgical History:  Procedure Laterality Date  . AORTIC VALVE REPLACEMENT    . CARDIAC CATHETERIZATION  11/10/2004   40% right common illiac, 70% in stent restenosis of distal left common illiac,   . CARDIAC CATHETERIZATION  05/18/2004   LAD 50-70% midstenosis, RCA dominant w/50% stenosis, 50% Right common Illiac artery ostial stenosis, 90% in stent restenosis within midportion of left common illiac stent  . Carotid Duplex  03/12/2012   RSA-elev. velocities suggestive of a 50-69% diameter reduction, Right&Left Bulb/Prox ICA-mild-mod.fibrous plaqueelevating Velocities abnormal study.  . CHOLECYSTECTOMY N/A 03/01/2018   Procedure: LAPAROSCOPIC CHOLECYSTECTOMY;  Surgeon: Judeth Horn, MD;  Location: [facility];  Service: General;  Laterality: N/A;  . COLONOSCOPY WITH PROPOFOL N/A 01/22/2018   Procedure: COLONOSCOPY WITH PROPOFOL;  Surgeon: Wilford Corner, MD;  [facility]: [facility];  Service: Endoscopy;  Laterality: N/A;  . ESOPHAGOGASTRODUODENOSCOPY (EGD) WITH PROPOFOL N/A 01/22/2018   Procedure: ESOPHAGOGASTRODUODENOSCOPY (EGD) WITH PROPOFOL;  Surgeon: Wilford Corner, MD;  Location: [facility];  Service: Endoscopy;  Laterality: N/A;  . ESOPHAGOGASTRODUODENOSCOPY (EGD) WITH PROPOFOL N/A 11/21/2018   Procedure: ESOPHAGOGASTRODUODENOSCOPY (EGD) WITH PROPOFOL;  Surgeon: [name] Juniper, MD;  Location: [facility];  Service: Gastroenterology;  Laterality: N/A;  . ESOPHAGOGASTRODUODENOSCOPY (EGD) WITH PROPOFOL Left 02/27/2019   Procedure: ESOPHAGOGASTRODUODENOSCOPY (EGD) WITH PROPOFOL;  Surgeon: [name] Silence, MD;  Location: [facility];  Service: Endoscopy;  Laterality: Left;  . GIVENS CAPSULE STUDY N/A 11/21/2018   Procedure: GIVENS CAPSULE STUDY;  Surgeon: [name] Juniper, MD;  Location:  [facility];  Service: Gastroenterology;  Laterality: N/A;  To be deployed during EGD  . Lower Ext. Duplex  03/12/2012   Right Proximal CIA- vessel narrowing w/elevated velocities 0-49% diameter reduction. Right SFA-mild mixed density plaque throughout vessel.  [name] NM MYOCAR PERF WALL MOTION  05/19/2010   protocol: Persantine, post stress EF 65%, negative for ischemia, low risk scan  . RIGHT HEART CATH N/A 06/27/2018   Procedure: RIGHT HEART CATH;  Surgeon: [name] Dresser, MD;  Location: [facility];  Service: Cardiovascular;  Laterality: N/A;  . TRANSTHORACIC ECHOCARDIOGRAM  08/29/2012   Moderately calcified annulus of mitral valve, moderate regurg. of both mitral valve and tricuspid valve.     FAMILY HISTORY: Family History  Problem Relation Age of Onset  . Breast cancer Neg Hx     GYNECOLOGIC HISTORY:  No LMP recorded. Patient has had a hysterectomy. Menarche:  years old Age at first live birth:  Erwin P: 1 LMP:  Contraceptive:  HRT:   Hysterectomy?: yes BSO?:    SOCIAL HISTORY: (Current as of 03/07/2019) [name] is widowed.  She lives by herself.  She has 3 children that live locally.  She smoked three quarters of a pack of cigarettes daily for 50 years.  She quit in 2005.  Denies alcohol use.   ADVANCED DIRECTIVES: Not in place    HEALTH MAINTENANCE: Social History   Tobacco Use  . Smoking status: Former Smoker    Packs/day: 1.00    Years: 30.00    Pack years: 30.00    Types: Cigarettes  . Smokeless tobacco: Never Used  . Tobacco comment: quit smoking 2005  Substance Use Topics  . Alcohol use: No    Alcohol/week: 0.0 standard drinks  . Drug use: No    Colonoscopy: EGD on 02/27/2019 under Dr. Paulita [name] found some friable gastric mucosa.  Colonoscopy under Dr. Michail [name] 01/22/2018 showed significant diverticular disease  PAP:   Bone density: May 2017 showed osteopenia Mammography: 02/25/2017, breast density category B.  Allergies  Allergen Reactions  .  Flagyl [Metronidazole] Rash    ALL-OVER BODY RASH  . Coreg [Carvedilol] Other (See Comments)    Terrible cramping in the feet and had a lot of bowel movements, but not diarrhea  . Losartan Swelling    Patient doesn't recall site of swelling  . Verapamil Hives  . Zetia [Ezetimibe] Other (See Comments)    Reaction not recalled  . Zocor [Simvastatin - High Dose] Other (See Comments)    Reaction not recalled    Current Outpatient Medications  Medication Sig Dispense Refill  . acetaminophen (TYLENOL) 325 MG tablet Take 650 mg by mouth every 6 (six) hours as needed for mild pain.     [name] allopurinol (ZYLOPRIM) 100 MG tablet Take 100 mg by mouth daily.    [name]  ALPRAZolam (XANAX) 0.5 MG tablet Take 0.25 mg by mouth at bedtime.     [name] amLODipine (NORVASC) 5 MG tablet Take 1 tablet (5 mg total) by mouth daily. 30 tablet 3  . atorvastatin (LIPITOR) 80 MG tablet Take 1 tablet (80 mg total) by mouth daily at 6 PM.    . Cholecalciferol (VITAMIN D3) 1000 UNITS CAPS Take 2,000 Units by mouth daily with lunch.     . cloNIDine (CATAPRES) 0.2 MG tablet Take 1 tablet (0.2 mg total) by mouth 2 (two) times daily. 60 tablet 3  . Cyanocobalamin (VITAMIN B12) 1000 MCG TBCR Take 1,000 mcg by mouth daily with lunch.     . feeding supplement, GLUCERNA SHAKE, (GLUCERNA SHAKE) LIQD Take 237 mLs by mouth 2 (two) times daily between meals. 237 mL 30  . fluconazole (DIFLUCAN) 200 MG tablet Take 1 tablet (200 mg total) by mouth daily. 5 tablet 2  . furosemide (LASIX) 40 MG tablet Take 2 tablets (80 mg total) by mouth 2 (two) times daily. 120 tablet 3  . hydrALAZINE (APRESOLINE) 100 MG tablet Take 1 tablet (100 mg total) by mouth 3 (three) times daily. 270 tablet 1  . Magnesium 500 MG TABS Take 500 mg by mouth daily.    . magnesium oxide (MAG-OX) 400 (241.3 Mg) MG tablet Take 1 tablet (400 mg total) by mouth daily.    . metFORMIN (GLUCOPHAGE) 500 MG tablet Take 500 mg by mouth 2 (two) times daily with a meal.     . metoprolol  tartrate (LOPRESSOR) 25 MG tablet Take 0.5 tablets (12.5 mg total) by mouth 2 (two) times daily. 90 tablet 0  . ondansetron (ZOFRAN ODT) 4 MG disintegrating tablet Take 1 tablet (4 mg total) by mouth every 8 (eight) hours as needed for nausea or vomiting. 6 tablet 0  . pantoprazole (PROTONIX) 40 MG tablet Take 40 mg by mouth 2 (two) times daily.     . predniSONE (DELTASONE) 5 MG tablet Take 2 tablets (10 mg total) by mouth daily with breakfast. 90 tablet 2  . spironolactone (ALDACTONE) 25 MG tablet Take 1 tablet (25 mg total) by mouth daily. 90 tablet 3  . tadalafil, PAH, (ADCIRCA) 20 MG tablet Take 2 tablets (40 mg total) by mouth daily. 60 tablet 6  . traMADol (ULTRAM) 50 MG tablet Take 1 tablet (50 mg total) by mouth every 6 (six) hours as needed. 15 tablet 0  . vitamin C (ASCORBIC ACID) 500 MG tablet Take 500 mg by mouth daily with lunch.    . warfarin (COUMADIN) 5 MG tablet TAKE 1.5 TO 2 TABLETS BY MOUTH DAILY AS DIRECTED BY COUMADIN CLINIC 180 tablet 0   No current facility-administered medications for this visit.     OBJECTIVE: older white woman using a walker Vitals:   10/13/19 1045  BP: (!) 149/40  Pulse: (!) 49  Resp: 18  Temp: 98.5 F (36.9 C)  SpO2: 97%   Wt Readings from Last 3 Encounters:  10/13/19 120 lb 11.2 oz (54.7 kg)  09/15/19 121 lb 14.4 oz (55.3 kg)  09/11/19 122 lb 9.6 oz (55.6 kg)   Body mass index is 22.08 kg/m.    ECOG FS:2 - Symptomatic, <50% confined to bed  Sclerae unicteric, EOMs intact Wearing a mask No cervical or supraclavicular adenopathy Lungs no rales or rhonchi Heart regular rate and rhythm Abd soft, nontender, positive bowel sounds MSK no focal spinal tenderness, no upper extremity lymphedema Neuro: nonfocal, well oriented, appropriate affect Breasts: Deferred  LAB RESULTS:  CMP     Component Value Date/Time   NA 137 09/26/2019 1110   NA 137 12/06/2017 1436   K 3.6 09/26/2019 1110   CL 103 09/26/2019 1110   CO2 24 09/26/2019  1110   GLUCOSE 128 (H) 09/26/2019 1110   BUN 63 (H) 09/26/2019 1110   BUN 20 12/06/2017 1436   CREATININE 1.56 (H) 09/26/2019 1110   CREATININE 1.98 (H) 09/15/2019 1023   CALCIUM 9.2 09/26/2019 1110   PROT 5.9 (L) 09/15/2019 1023   ALBUMIN 3.0 (L) 09/15/2019 1023   AST 37 09/15/2019 1023   ALT 25 09/15/2019 1023   ALKPHOS 88 09/15/2019 1023   BILITOT 0.4 09/15/2019 1023   GFRNONAA 31 (L) 09/26/2019 1110   GFRNONAA 23 (L) 09/15/2019 1023   GFRAA 36 (L) 09/26/2019 1110   GFRAA 27 (L) 09/15/2019 1023    Lab Results  Component Value Date   TOTALPROTELP 6.9 05/07/2019    No results found for: KPAFRELGTCHN, LAMBDASER, KAPLAMBRATIO  Lab Results  Component Value Date   WBC 6.1 10/13/2019   NEUTROABS 4.3 10/13/2019   HGB 11.3 (L) 10/13/2019   HCT 35.7 (L) 10/13/2019   MCV 84.6 10/13/2019   PLT 175 10/13/2019    No results found for: LABCA2  No components found for: GYFVCB449  No results for input(s): INR in the last 168 hours.  No results found for: LABCA2  No results found for: QPR916  No results found for: BWG665  No results found for: LDJ570  No results found for: CA2729  No components found for: HGQUANT  No results found for: CEA1 / No results found for: CEA1   No results found for: AFPTUMOR  No results found for: CHROMOGRNA  No results found for: HGBA, HGBA2QUANT, HGBFQUANT, HGBSQUAN (Hemoglobinopathy evaluation)   Lab Results  Component Value Date   LDH 212 (H) 08/04/2019    Lab Results  Component Value Date   IRON 63 09/15/2019   TIBC 235 (L) 09/15/2019   IRONPCTSAT 27 09/15/2019   (Iron and TIBC)  Lab Results  Component Value Date   FERRITIN 279 09/15/2019    Urinalysis    Component Value Date/Time   COLORURINE YELLOW 07/07/2019 0209   APPEARANCEUR CLOUDY (A) 07/07/2019 0209   LABSPEC 1.011 07/07/2019 0209   PHURINE 6.0 07/07/2019 0209   GLUCOSEU NEGATIVE 07/07/2019 0209   HGBUR NEGATIVE 07/07/2019 0209   BILIRUBINUR NEGATIVE  07/07/2019 0209   KETONESUR NEGATIVE 07/07/2019 0209   PROTEINUR >=300 (A) 07/07/2019 0209   UROBILINOGEN 1.0 10/06/2011 1709   NITRITE NEGATIVE 07/07/2019 0209   LEUKOCYTESUR LARGE (A) 07/07/2019 0209    STUDIES:  No results found.   ELIGIBLE FOR AVAILABLE RESEARCH PROTOCOL: no   ASSESSMENT: 79 y.o. [place] woman with multifactorial anemia, as follows:             (a) anemia of chronic inflammation: As of May 2020 she has a SED rate of 95, CRP 1.1, positive ANA and RF; subsequently she was found to be ANCA positive             (b) hemolysis (mild): she has a mildly elevated LDH and a DAT positive for complement, not IgG; this is c/w (a) above             (c) anemia of renal insufficiency: inadequate EPO resonse to anemia              (d) GIB/ iron deficiency in patient on lifelong anticoagulaton  (1)  Feraheme received 03/01/2019, repeated 03/20/2019   (a) reticulocyte 03/07/2019 up from 43.4 a year ago to 191.8 after iron infusion  (b) feraheme repeated on 05/12/2019  (c) subsequent ferritin levels >100  (2) darbepoetin: starting 05/05/2019 at 13mcg given every 2 weeks  (a) dose increased to 200 mcg every week starting 07/01/2019   (b) back to every 2 weeks beginning 08/04/2019 still at 200 mcg dose  (3) ANCA positive vasculitis: diagnosed 05/08/2019 (titer 1:640)  (a) prednisone 40 mg daily started 05/31/2019  (b) rituximab weekly started 06/12/2019 (received test dose 06/05/2019)   (i) hepatitis B sAg, core Ab and HIV negative 05/08/2019   (ii) rituximab held after 07/14/2019 dose (received 5 weekly doses) with neutropenia   (iii) rituximab resumed 08/18/2019 but changed to monthly  (c) prednisone tapered to off as of 09/15/2019  (4) osteopenia with a T score of -1.5 on bone density 02/28/2016 at the breast center    PLAN: Maysoon is doing remarkably well with her current immunosuppressive treatment.  She is off prednisone.  She is now receiving rituximab every 4  weeks.  She is having no side effects from this and her white cell count and platelet count is normal.   Her hemoglobin is greater than 11 which means she will not receive Retacrit today.  Her last dose was 4 weeks ago.  Her reticulocyte count is a bit down.  I am hopeful her hemoglobin will not significantly decrease.  She will be treated with rituximab today and return in 4 and 8 weeks.  I will see her 8 weeks from now and if all is going well we will change her rituximab to every 2 months.  I spent 35 minutes total time in this encounter with Levonne Spiller C. Ayson Cherubini, MD  10/13/19 11:07 AM Medical Oncology and Hematology [facility] [place], [place] [address] Tel. [phone]    Fax. [phone]   I, Wilburn Mylar, am acting as scribe for Dr. Virgie [name]. [name].  I, Lurline Del MD, have reviewed the above documentation for accuracy and completeness, and I agree with the above.

## 2019-10-13 ENCOUNTER — Inpatient Hospital Stay: Payer: Medicare HMO

## 2019-10-13 ENCOUNTER — Inpatient Hospital Stay (HOSPITAL_BASED_OUTPATIENT_CLINIC_OR_DEPARTMENT_OTHER): Payer: Medicare HMO | Admitting: Oncology

## 2019-10-13 ENCOUNTER — Encounter (HOSPITAL_COMMUNITY): Payer: Medicare HMO

## 2019-10-13 ENCOUNTER — Other Ambulatory Visit: Payer: Self-pay

## 2019-10-13 ENCOUNTER — Inpatient Hospital Stay: Payer: Medicare HMO | Attending: Oncology

## 2019-10-13 VITALS — BP 169/48 | HR 48 | Temp 97.9°F | Resp 18

## 2019-10-13 VITALS — BP 149/40 | HR 49 | Temp 98.5°F | Resp 18 | Ht 62.0 in | Wt 120.7 lb

## 2019-10-13 DIAGNOSIS — I7782 Antineutrophilic cytoplasmic antibody (ANCA) vasculitis: Secondary | ICD-10-CM

## 2019-10-13 DIAGNOSIS — N189 Chronic kidney disease, unspecified: Secondary | ICD-10-CM | POA: Insufficient documentation

## 2019-10-13 DIAGNOSIS — D61818 Other pancytopenia: Secondary | ICD-10-CM | POA: Diagnosis not present

## 2019-10-13 DIAGNOSIS — D508 Other iron deficiency anemias: Secondary | ICD-10-CM

## 2019-10-13 DIAGNOSIS — I776 Arteritis, unspecified: Secondary | ICD-10-CM

## 2019-10-13 DIAGNOSIS — Z5112 Encounter for antineoplastic immunotherapy: Secondary | ICD-10-CM | POA: Diagnosis not present

## 2019-10-13 DIAGNOSIS — Z952 Presence of prosthetic heart valve: Secondary | ICD-10-CM | POA: Diagnosis not present

## 2019-10-13 DIAGNOSIS — M8580 Other specified disorders of bone density and structure, unspecified site: Secondary | ICD-10-CM | POA: Diagnosis not present

## 2019-10-13 DIAGNOSIS — N184 Chronic kidney disease, stage 4 (severe): Secondary | ICD-10-CM | POA: Insufficient documentation

## 2019-10-13 DIAGNOSIS — D701 Agranulocytosis secondary to cancer chemotherapy: Secondary | ICD-10-CM

## 2019-10-13 DIAGNOSIS — T451X5A Adverse effect of antineoplastic and immunosuppressive drugs, initial encounter: Secondary | ICD-10-CM

## 2019-10-13 DIAGNOSIS — D631 Anemia in chronic kidney disease: Secondary | ICD-10-CM | POA: Insufficient documentation

## 2019-10-13 DIAGNOSIS — N289 Disorder of kidney and ureter, unspecified: Secondary | ICD-10-CM

## 2019-10-13 DIAGNOSIS — D696 Thrombocytopenia, unspecified: Secondary | ICD-10-CM | POA: Diagnosis not present

## 2019-10-13 DIAGNOSIS — D594 Other nonautoimmune hemolytic anemias: Secondary | ICD-10-CM

## 2019-10-13 DIAGNOSIS — I739 Peripheral vascular disease, unspecified: Secondary | ICD-10-CM

## 2019-10-13 LAB — CMP (CANCER CENTER ONLY)
ALT: 18 U/L (ref 0–44)
AST: 33 U/L (ref 15–41)
Albumin: 3.5 g/dL (ref 3.5–5.0)
Alkaline Phosphatase: 79 U/L (ref 38–126)
Anion gap: 14 (ref 5–15)
BUN: 64 mg/dL — ABNORMAL HIGH (ref 8–23)
CO2: 20 mmol/L — ABNORMAL LOW (ref 22–32)
Calcium: 9.3 mg/dL (ref 8.9–10.3)
Chloride: 102 mmol/L (ref 98–111)
Creatinine: 1.53 mg/dL — ABNORMAL HIGH (ref 0.44–1.00)
GFR, Est AFR Am: 37 mL/min — ABNORMAL LOW (ref >60)
GFR, Estimated: 32 mL/min — ABNORMAL LOW (ref >60)
Glucose, Bld: 196 mg/dL — ABNORMAL HIGH (ref 70–99)
Potassium: 3.5 mmol/L (ref 3.5–5.1)
Sodium: 136 mmol/L (ref 135–145)
Total Bilirubin: 0.4 mg/dL (ref 0.3–1.2)
Total Protein: 6.2 g/dL — ABNORMAL LOW (ref 6.5–8.1)

## 2019-10-13 LAB — CBC WITH DIFFERENTIAL/PLATELET
Abs Immature Granulocytes: 0.04 10*3/uL (ref 0.00–0.07)
Basophils Absolute: 0.1 10*3/uL (ref 0.0–0.1)
Basophils Relative: 1 %
Eosinophils Absolute: 0.1 10*3/uL (ref 0.0–0.5)
Eosinophils Relative: 2 %
HCT: 35.7 % — ABNORMAL LOW (ref 36.0–46.0)
Hemoglobin: 11.3 g/dL — ABNORMAL LOW (ref 12.0–15.0)
Immature Granulocytes: 1 %
Lymphocytes Relative: 17 %
Lymphs Abs: 1 10*3/uL (ref 0.7–4.0)
MCH: 26.8 pg (ref 26.0–34.0)
MCHC: 31.7 g/dL (ref 30.0–36.0)
MCV: 84.6 fL (ref 80.0–100.0)
Monocytes Absolute: 0.6 10*3/uL (ref 0.1–1.0)
Monocytes Relative: 9 %
Neutro Abs: 4.3 10*3/uL (ref 1.7–7.7)
Neutrophils Relative %: 70 %
Platelets: 175 10*3/uL (ref 150–400)
RBC: 4.22 MIL/uL (ref 3.87–5.11)
RDW: 17.8 % — ABNORMAL HIGH (ref 11.5–15.5)
WBC: 6.1 10*3/uL (ref 4.0–10.5)
nRBC: 0 % (ref 0.0–0.2)

## 2019-10-13 LAB — RETICULOCYTES
Immature Retic Fract: 4.8 % (ref 2.3–15.9)
RBC.: 4.24 MIL/uL (ref 3.87–5.11)
Retic Count, Absolute: 35.2 10*3/uL (ref 19.0–186.0)
Retic Ct Pct: 0.8 % (ref 0.4–3.1)

## 2019-10-13 LAB — SAVE SMEAR(SSMR), FOR PROVIDER SLIDE REVIEW

## 2019-10-13 MED ORDER — DIPHENHYDRAMINE HCL 25 MG PO CAPS
25.0000 mg | ORAL_CAPSULE | Freq: Once | ORAL | Status: AC
  Administered 2019-10-13: 12:00:00 25 mg via ORAL

## 2019-10-13 MED ORDER — DIPHENHYDRAMINE HCL 25 MG PO CAPS
ORAL_CAPSULE | ORAL | Status: AC
  Filled 2019-10-13: qty 1

## 2019-10-13 MED ORDER — ACETAMINOPHEN 325 MG PO TABS
ORAL_TABLET | ORAL | Status: AC
  Filled 2019-10-13: qty 2

## 2019-10-13 MED ORDER — ACETAMINOPHEN 325 MG PO TABS
650.0000 mg | ORAL_TABLET | Freq: Once | ORAL | Status: AC
  Administered 2019-10-13: 650 mg via ORAL

## 2019-10-13 MED ORDER — SODIUM CHLORIDE 0.9 % IV SOLN
Freq: Once | INTRAVENOUS | Status: AC
  Filled 2019-10-13: qty 250

## 2019-10-13 MED ORDER — SODIUM CHLORIDE 0.9 % IV SOLN
375.0000 mg/m2 | Freq: Once | INTRAVENOUS | Status: AC
  Administered 2019-10-13: 600 mg via INTRAVENOUS
  Filled 2019-10-13: qty 50

## 2019-10-13 NOTE — Progress Notes (Signed)
Patient tolerated Rituximab infusion today with no complain or concerns verbalized.

## 2019-10-13 NOTE — Progress Notes (Signed)
Per Dr. Jana Hakim, change patient to rapid rituximab with next infusion if she tolerates rituximab infusion today. Patient tolerated well so orders have been updated to reflect this.   [name], PharmD, [facility] Pharmacist Pharmacy Phone: [phone] 10/13/2019

## 2019-10-13 NOTE — Patient Instructions (Signed)
[facility] Discharge Instructions for Patients Receiving Chemotherapy  Today you received the following chemotherapy agents Rituximab (RITUXAN).  To help prevent nausea and vomiting after your treatment, we encourage you to take your nausea medication as prescribed.   If you develop nausea and vomiting that is not controlled by your nausea medication, call the clinic.   BELOW ARE SYMPTOMS THAT SHOULD BE REPORTED IMMEDIATELY:  *FEVER GREATER THAN 100.5 F  *CHILLS WITH OR WITHOUT FEVER  NAUSEA AND VOMITING THAT IS NOT CONTROLLED WITH YOUR NAUSEA MEDICATION  *UNUSUAL SHORTNESS OF BREATH  *UNUSUAL BRUISING OR BLEEDING  TENDERNESS IN MOUTH AND THROAT WITH OR WITHOUT PRESENCE OF ULCERS  *URINARY PROBLEMS  *BOWEL PROBLEMS  UNUSUAL RASH Items with * indicate a potential emergency and should be followed up as soon as possible.  Feel free to call the clinic should you have any questions or concerns. The clinic phone number is [phone].  Please show the [facility] at check-in to the Emergency Department and triage nurse.  Coronavirus (COVID-19) Are you at risk?  Are you at risk for the Coronavirus (COVID-19)?  To be considered HIGH RISK for Coronavirus (COVID-19), you have to meet the following criteria:  . Traveled to [place], [place], [place], [place] or [place]; or in the [place] to [place], [place], [place], or [place]; and have fever, cough, and shortness of breath within the last 2 weeks of travel OR . Been in close contact with a person diagnosed with COVID-19 within the last 2 weeks and have fever, cough, and shortness of breath . IF YOU DO NOT MEET THESE CRITERIA, YOU ARE CONSIDERED LOW RISK FOR COVID-19.  What to do if you are HIGH RISK for COVID-19?  [name] If you are having a medical emergency, call 911. . Seek medical care right away. Before you go to a doctor's office, urgent care or emergency department, call ahead and tell them  about your recent travel, contact with someone diagnosed with COVID-19, and your symptoms. You should receive instructions from your physician's office regarding next steps of care.  . When you arrive at healthcare provider, tell the healthcare staff immediately you have returned from visiting [place], [place], [place], [place] or [place]; or traveled in the [place] to [place], [place], [place], or [place]; in the last two weeks or you have been in close contact with a person diagnosed with COVID-19 in the last 2 weeks.   . Tell the health care staff about your symptoms: fever, cough and shortness of breath. . After you have been seen by a medical provider, you will be either: o Tested for (COVID-19) and discharged home on quarantine except to seek medical care if symptoms worsen, and asked to  - Stay home and avoid contact with others until you get your results (4-5 days)  - Avoid travel on public transportation if possible (such as bus, train, or airplane) or o Sent to the Emergency Department by EMS for evaluation, COVID-19 testing, and possible admission depending on your condition and test results.  What to do if you are LOW RISK for COVID-19?  Reduce your risk of any infection by using the same precautions used for avoiding the common cold or flu:  [name] Wash your hands often with soap and warm water for at least 20 seconds.  If soap and water are not readily available, use an alcohol-based hand sanitizer with at least 60% alcohol.  . If coughing or  sneezing, cover your mouth and nose by coughing or sneezing into the elbow areas of your shirt or coat, into a tissue or into your sleeve (not your hands). . Avoid shaking hands with others and consider head nods or verbal greetings only. . Avoid touching your eyes, nose, or mouth with unwashed hands.  . Avoid close contact with people who are sick. . Avoid places or events with large numbers of people in one location, like concerts or  sporting events. . Carefully consider travel plans you have or are making. . If you are planning any travel outside or inside the [place], visit the CDC's Travelers' Health webpage for the latest health notices. . If you have some symptoms but not all symptoms, continue to monitor at home and seek medical attention if your symptoms worsen. . If you are having a medical emergency, call 911.   [facility] / e-Visit: [email or web address]         [facility]: [facility]: [phone]                   [facility]: [phone]

## 2019-10-13 NOTE — Addendum Note (Signed)
Addended by: Delane Ginger on: 10/13/2019 04:02 PM   Modules accepted: Orders

## 2019-10-14 ENCOUNTER — Encounter (HOSPITAL_COMMUNITY): Payer: Medicare HMO

## 2019-10-14 LAB — FERRITIN: Ferritin: 164 ng/mL (ref 11–307)

## 2019-10-14 LAB — IRON AND TIBC
Iron: 100 ug/dL (ref 41–142)
Saturation Ratios: 38 % (ref 21–57)
TIBC: 261 ug/dL (ref 236–444)
UIBC: 161 ug/dL (ref 120–384)

## 2019-10-15 NOTE — Progress Notes (Signed)
Counseling Intern spoke to patient via phone on 10/15/19 for a counseling session.  Counseling intern provided therapeutic listening, empathy, compassion, and collaboratively explored the patient's current emotional experience. Pt reported "feeling better physically and mentally" and the counseling intern noted that the pt sounded upbeat and demonstrated a positive outlook. Pt stated she has more energy to do small projects such as straightening her house. Pt stated she has a goal of beginning to work on sewing projects again. The counseling intern and pt discussed how this hobby brings her joy and a sense of purpose and that starting with a small project is a good first step, stopping when she feels tired and remembering that she is in no rush. Pt stated she feels grateful for her neighbor's kindness and support during the past 6 months and now wants to repay her by bringing her a meal when she is recovering from surgery. Pt stated that she recognizes she is "getting older" and that she may never have the energy she had in the past. Pt and counseling intern discussed accepting the reality of her current energy level, while still remaining hopeful for improvement. Additionally, pt expressed excitement for her plan to buy new carpet with her stimulus check and about getting rid of "old stuff" in her house in preparation for the carpet instillation.   Next Counseling Appt: 10/22/19 at 9:30am via phone  [facility] Counseling Intern Voicemail:  [phone]

## 2019-10-17 ENCOUNTER — Ambulatory Visit: Payer: Self-pay | Admitting: Pharmacist

## 2019-10-18 IMAGING — CT CT L SPINE W/O CM
3 of 4 series · 16 of 33 positions shown, 19 images · non-contrast
Comparison: Abdominal CT 01/18/2018

CLINICAL DATA: Compression fracture

EXAM:
CT LUMBAR SPINE WITHOUT CONTRAST
TECHNIQUE: Multidetector CT imaging of the lumbar spine was performed without
intravenous contrast administration. Multiplanar CT image
reconstructions were also generated.

[Series 7: axial st thin lumbar · axial · 0.28mm/px · z∈[-293,-106]mm · 8 of 467 slices shown, 10 images]
[im 47/467  soft-tissue]
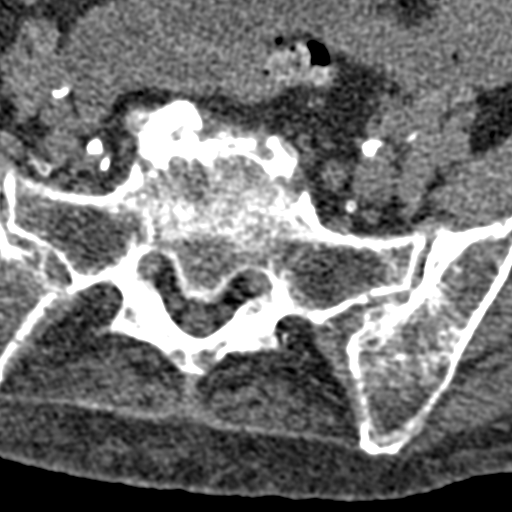
[im 47/467  bone]
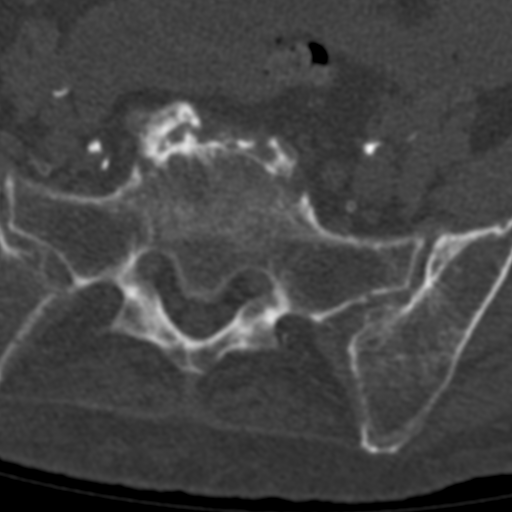
[im 94/467  bone]
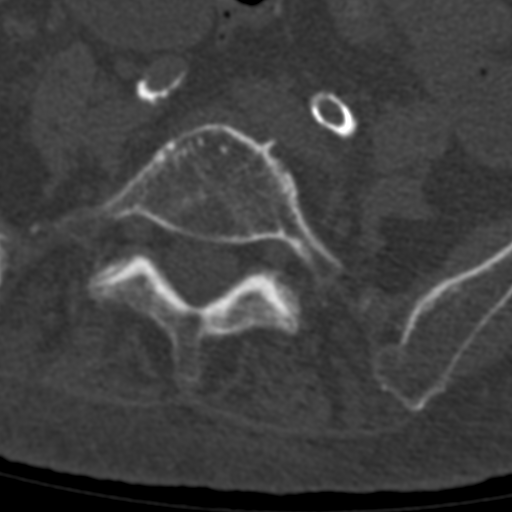
[im 140/467  bone]
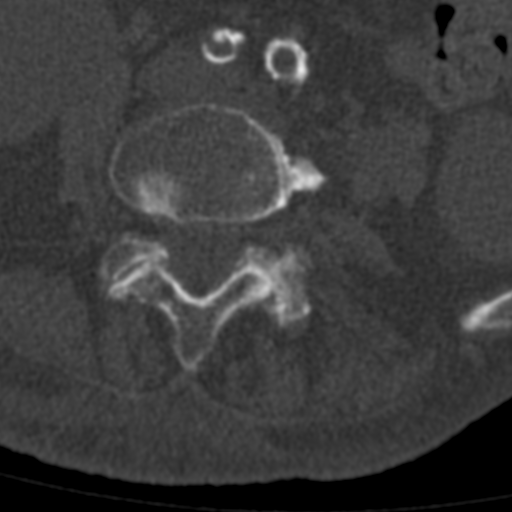
[im 187/467  bone]
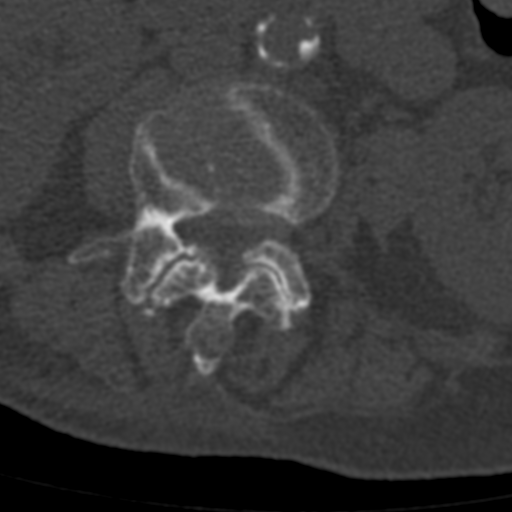
[im 280/467  soft-tissue]
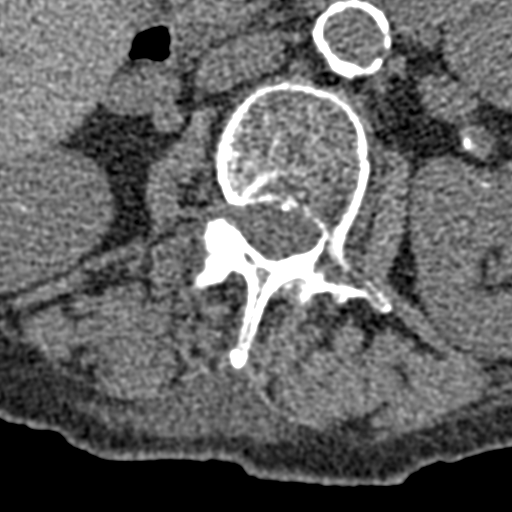
[im 280/467  bone]
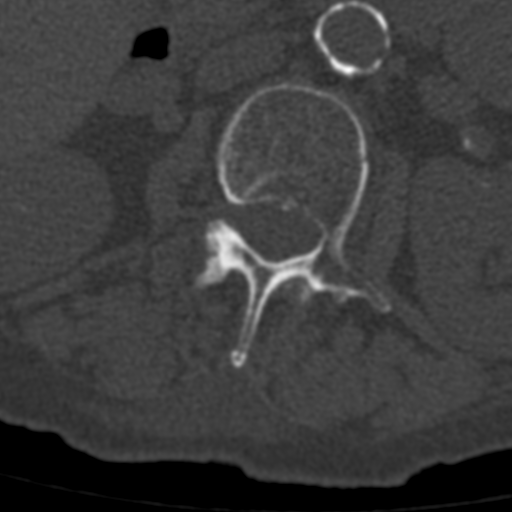
[im 327/467  bone]
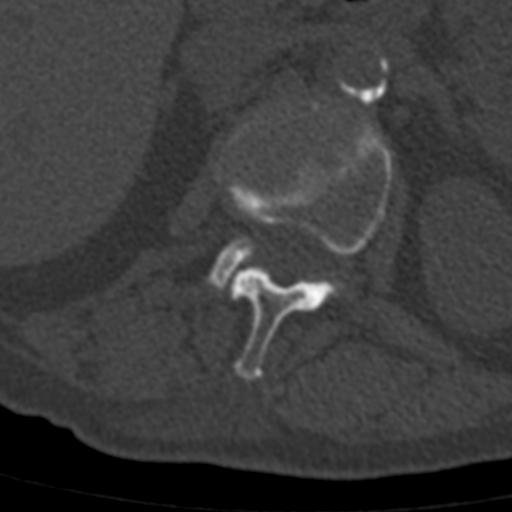
[im 373/467  bone]
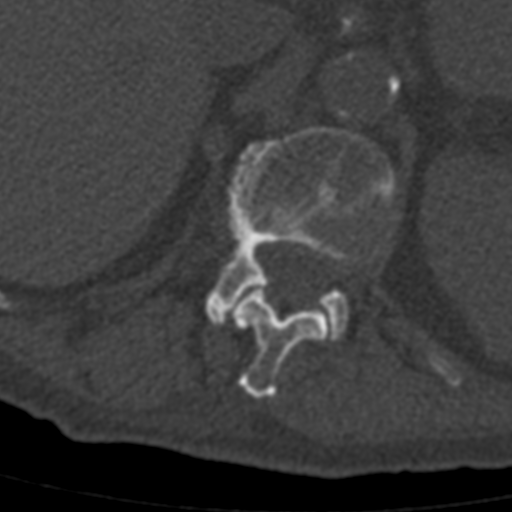
[im 420/467  bone]
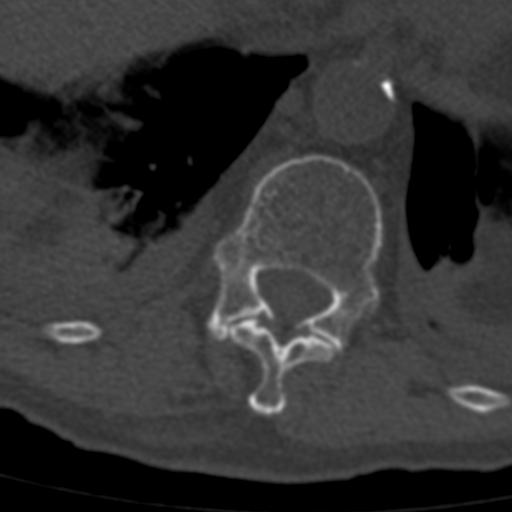

[Series 602: st coronal lumbar · coronal · 0.46mm/px · 3 of 60 slices shown]
[im 12/60  bone]
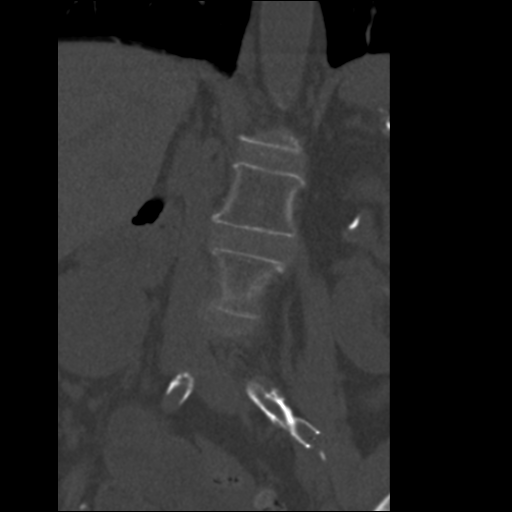
[im 24/60  bone]
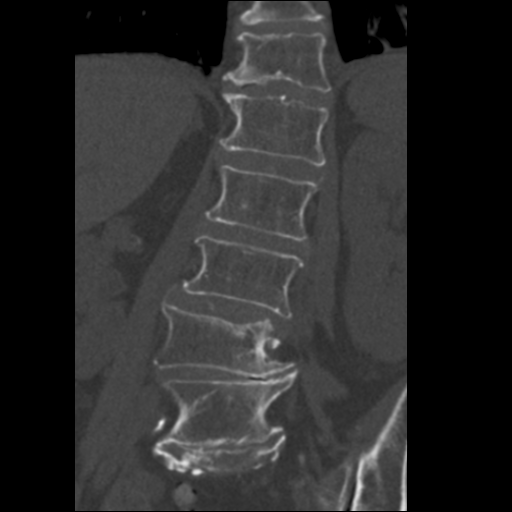
[im 36/60  bone]
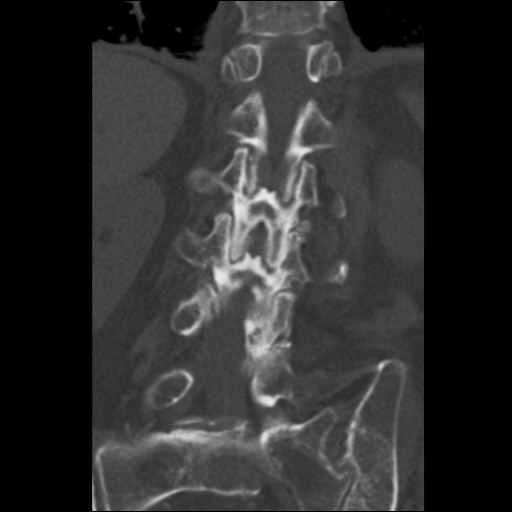

[Series 603: st sagital lumbar · sagittal · 0.46mm/px · 5 of 50 slices shown, 6 images]
[im 17/50  bone]
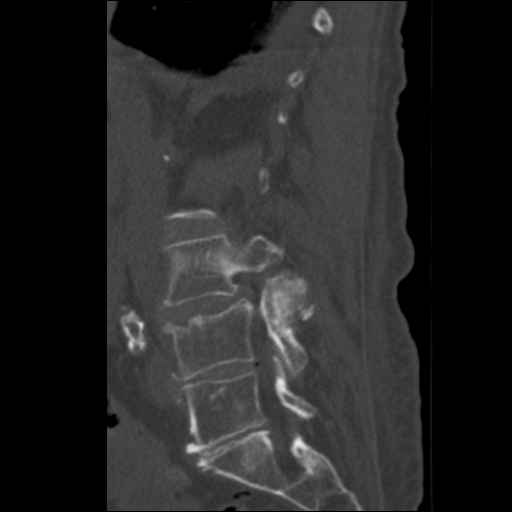
[im 21/50  bone]
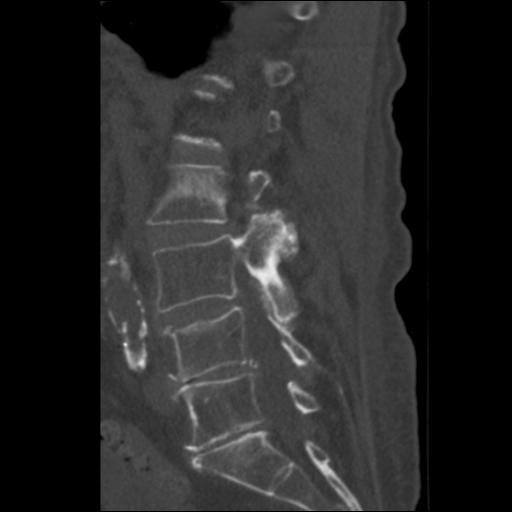
[im 25/50  soft-tissue]
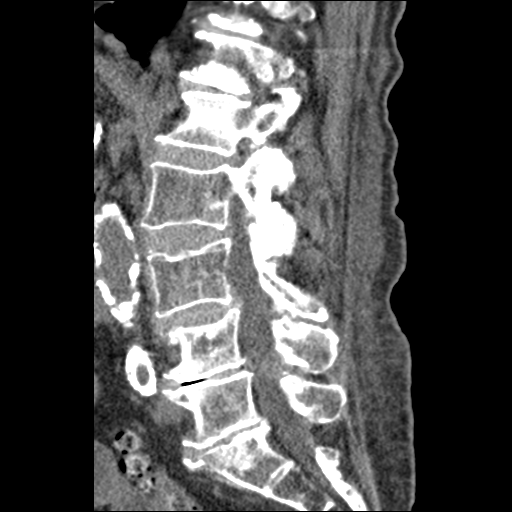
[im 25/50  bone]
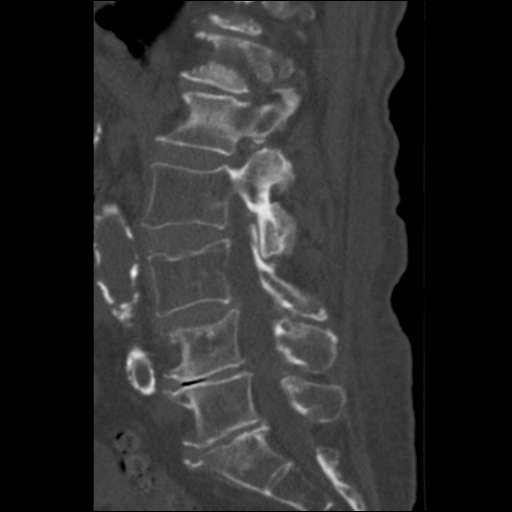
[im 29/50  bone]
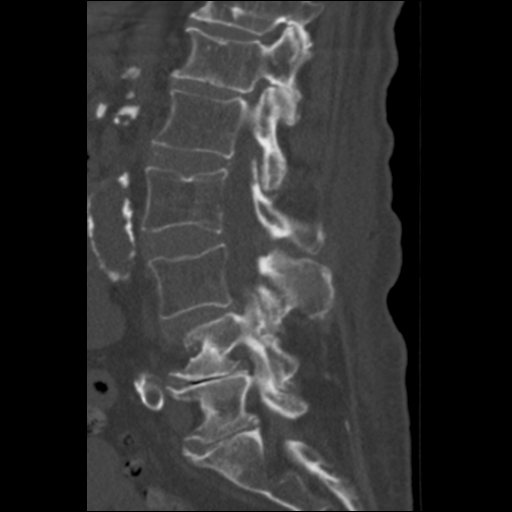
[im 33/50  bone]
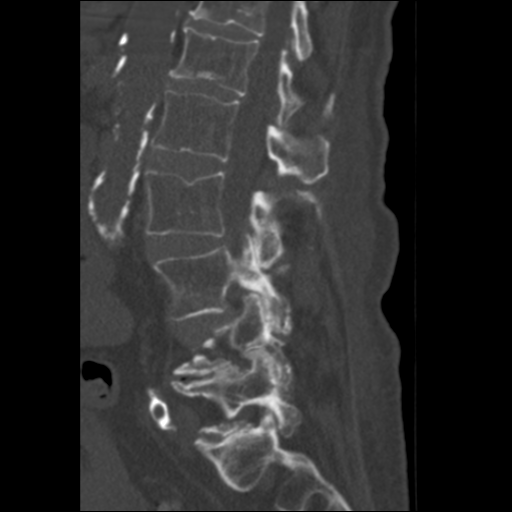

[16 of 33 positions shown; findings below may reference images not displayed]

FINDINGS: Segmentation: 5 lumbar type vertebrae

Alignment: Lower lumbar dextroscoliosis and upper lumbar
levoscoliosis. No traumatic malalignment.

Vertebrae: L4 superior endplate fracture that has occurred since
comparison but is chronic appearing. There may have also been a left
L5 transverse process fracture in the interim. No acute fracture. No
evident bone lesion or erosion

Paraspinal and other soft tissues: Reported separately

Disc levels:

T12- L1: Left-sided disc narrowing and facet spurring with moderate
left foraminal stenosis.

L1-L2: Mild disc narrowing and bulging.  Mild facet spurring.

L2-L3: Disc narrowing and bulging with facet spurring causing mild
bilateral foraminal stenosis.

L3-L4: Disc narrowing and bulging. Degenerative facet spurring that
is moderate. Moderate right more than left foraminal narrowing.
Moderate or advanced spinal stenosis.

L4-L5: Asymmetric left-sided degenerative disc collapse and ridging.
Asymmetric left facet spurring. Left foraminal impingement. Mild
spinal stenosis.

L5-S1:Greatest level of degenerative disc narrowing with spurring.
Mild facet degeneration. The canal and foramina appear patent.
IMPRESSION: 1. No acute finding.
2. L4 superior endplate fracture that has occurred since 5588 but is
chronic appearing.
3. Degenerative disease with scoliosis as described.

## 2019-10-20 ENCOUNTER — Telehealth: Payer: Self-pay | Admitting: Oncology

## 2019-10-20 NOTE — Telephone Encounter (Signed)
Scheduled per 1/8 sch msg. Called and spoke with pt, confirmed 1/19 appt

## 2019-10-21 ENCOUNTER — Telehealth (HOSPITAL_COMMUNITY): Payer: Self-pay | Admitting: Cardiology

## 2019-10-21 ENCOUNTER — Encounter (HOSPITAL_COMMUNITY): Payer: Self-pay

## 2019-10-21 ENCOUNTER — Other Ambulatory Visit: Payer: Self-pay

## 2019-10-21 ENCOUNTER — Ambulatory Visit (HOSPITAL_COMMUNITY)
Admission: RE | Admit: 2019-10-21 | Discharge: 2019-10-21 | Disposition: A | Discharge status: Home / Self Care | Payer: Medicare HMO | Source: Ambulatory Visit | Attending: Adult Health | Admitting: Adult Health

## 2019-10-21 VITALS — BP 148/64 | HR 41 | Wt 120.2 lb

## 2019-10-21 DIAGNOSIS — I5032 Chronic diastolic (congestive) heart failure: Secondary | ICD-10-CM | POA: Diagnosis not present

## 2019-10-21 DIAGNOSIS — I110 Hypertensive heart disease with heart failure: Secondary | ICD-10-CM | POA: Insufficient documentation

## 2019-10-21 DIAGNOSIS — Z7901 Long term (current) use of anticoagulants: Secondary | ICD-10-CM | POA: Diagnosis not present

## 2019-10-21 DIAGNOSIS — E119 Type 2 diabetes mellitus without complications: Secondary | ICD-10-CM | POA: Insufficient documentation

## 2019-10-21 DIAGNOSIS — Z79899 Other long term (current) drug therapy: Secondary | ICD-10-CM | POA: Diagnosis not present

## 2019-10-21 DIAGNOSIS — I2510 Atherosclerotic heart disease of native coronary artery without angina pectoris: Secondary | ICD-10-CM | POA: Diagnosis not present

## 2019-10-21 DIAGNOSIS — R001 Bradycardia, unspecified: Secondary | ICD-10-CM | POA: Diagnosis not present

## 2019-10-21 DIAGNOSIS — I482 Chronic atrial fibrillation, unspecified: Secondary | ICD-10-CM | POA: Diagnosis not present

## 2019-10-21 DIAGNOSIS — E785 Hyperlipidemia, unspecified: Secondary | ICD-10-CM | POA: Diagnosis not present

## 2019-10-21 DIAGNOSIS — Z7952 Long term (current) use of systemic steroids: Secondary | ICD-10-CM | POA: Diagnosis not present

## 2019-10-21 DIAGNOSIS — Z87891 Personal history of nicotine dependence: Secondary | ICD-10-CM | POA: Insufficient documentation

## 2019-10-21 DIAGNOSIS — Z952 Presence of prosthetic heart valve: Secondary | ICD-10-CM | POA: Diagnosis not present

## 2019-10-21 DIAGNOSIS — Z7984 Long term (current) use of oral hypoglycemic drugs: Secondary | ICD-10-CM | POA: Diagnosis not present

## 2019-10-21 DIAGNOSIS — Z951 Presence of aortocoronary bypass graft: Secondary | ICD-10-CM | POA: Diagnosis not present

## 2019-10-21 DIAGNOSIS — M109 Gout, unspecified: Secondary | ICD-10-CM | POA: Insufficient documentation

## 2019-10-21 DIAGNOSIS — I272 Pulmonary hypertension, unspecified: Secondary | ICD-10-CM | POA: Insufficient documentation

## 2019-10-21 DIAGNOSIS — Z881 Allergy status to other antibiotic agents status: Secondary | ICD-10-CM | POA: Diagnosis not present

## 2019-10-21 DIAGNOSIS — I480 Paroxysmal atrial fibrillation: Secondary | ICD-10-CM

## 2019-10-21 MED ORDER — AMLODIPINE BESYLATE 10 MG PO TABS: 10.0000 mg | ORAL_TABLET | Freq: Every day | ORAL | 3 refills | Status: DC

## 2019-10-21 MED ORDER — METOPROLOL TARTRATE 25 MG PO TABS: 12.5000 mg | ORAL_TABLET | Freq: Every day | ORAL | 3 refills | Status: DC

## 2019-10-21 NOTE — Telephone Encounter (Signed)
COVID-19 pre-appointment screening questions:   Do you have a history of COVID-19 or a positive test result in the past 7-10 days?  NO  To the best of your knowledge, have you been in close contact with anyone with a confirmed diagnosis of COVID 19?  NO  Have you had any one or more of the following: Fever, chills, cough, shortness of breath (out of the normal for you) or any flu-like symptoms?   NO   Are you experiencing any of the following symptoms that is new or out of usual for you:  . Ear, nose or throat discomfort . Sore throat . Headache . Muscle Pain . Diarrhea . Loss of taste or smell  NO   Reviewed all the following with patient: . Use of hand sanitizer when entering the building . Everyone is required to wear a mask in the building, if you do not have a mask we are happy to provide you with one when you arrive . NO Visitor guidelines   If patient answers YES to any of questions they must change to a virtual visit and place note in comments about symptoms  

## 2019-10-21 NOTE — Patient Instructions (Signed)
INCREASE Amlodipine 10mg  tab daily.  DECREASE Metoprolol to 12.5mg  daily.  Please follow up with the [facility] in 3 months.  At the [facility], you and your health needs are our priority. As part of our continuing mission to provide you with exceptional heart care, we have created designated Provider Care Teams. These Care Teams include your primary Cardiologist (physician) and Advanced Practice Providers (APPs- Physician Assistants and Nurse Practitioners) who all work together to provide you with the care you need, when you need it.   You may see any of the following providers on your designated Care Team at your next follow up: [name] Dr Glori Bickers . Dr Loralie Champagne . [name], NP . Lyda Jester, PA . Audry Riles, PharmD   Please be sure to bring in all your medications bottles to every appointment.

## 2019-10-21 NOTE — Progress Notes (Signed)
Date:  10/21/2019   ID:  [name], DOB September 09, 1941, MRN [id number]    Provider location: [place] [facility] Type of Visit: Established patient   PCP:  [name], MD  Cardiologist:  Quay Burow, MD Primary HF: Dr. Aundra Dubin  Chief Complaint: Shortness of breath   History of Present Illness: [name] is a 79 y.o. female who has a history of PAD, CAD s/p CABG, mechanical AVR, pulmonary hypertension, and chronic diastolic CHF with prominent RV dysfunction. Patient had CABG-AVR in 10/05.  She has been in chronic atrial fibrillation, it appears, since at least 4/17.  She has significant PAD followed by Dr. [name].    She was admitted in 5/19 with dyspnea and marked volume overloaded. She was started on Lasix and it was titrated up.  Echo showed EF 55-60% with moderate RV dilation, severe RV systolic dysfunction, and PASP 88 mmHg.  [place] in 9/19 showed moderate pulmonary hypertension with near normal filling pressures. PVR was not particularly high at 3.1 WU primarily due to elevated cardiac output. V/Q scan did not show chronic PEs and PFTs were unremarkable. She is on Adcirca.   She was admitted in 2/20 with upper GI bleeding, found to have gastric ulcers.  She had a CT chest in 3/20 with subtle bilateral ground glass opacities.  Referred to pulmonary.   She was admitted again in 6/20 with a GI bleed and required transfusion.   She was seen by pulmonary in 7/20, PFTs showed no obstruction/restriction but decreased DLCO.    She was seen by rheumatology due to concern for rheumatoid arthritis.  She was thought to have gout and OA but no RA.   Echo was done in 7/20 showing  EF 60-65% with mild LVH, normal RV, mechanical aortic valve with mean gradient 17 mmHg, PASP 34 mmHg.   In 8/20, she was admitted with CHF/pneumonitis and pancytopenia.  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  She has been  treated  with rituximab and prednisone, hgb has been increasing.   Today she returns for HF follow up. Last visit lasix was increased to 80 mg twice a day and amlodipine was increased to 5 mg daily. Overall feeling fine. Says she gets tired after walking. Denies SOB/PND/Orthopnea. Appetite ok. No fever or chills. No bleeding issues. Weight at home 120-121  pounds. Taking all medications.   REDS clip: 36%   Labs (8/19): K 4.2, creatinine 0.8, LDL 56 Labs (9/19): K 4.3, Na 129, creatinine 0.87, hgb 9.8 Labs (10/19): ANA+, RF 36.8 (elevated), SCL-70 negative, anti-centromere Ab negative, ds-DNA negative.  Labs (12/19): K 4, creatinine 1.09 Labs (1/20): CCP Ab elevated 115 Labs (2/20): K 4.1, creatinine 1.24 Labs (7/20): K 4.2, creatinine 1.99, hgb 6.5 (transfused) Labs (11/20): K 3.5, creatinine 1.64  6 min walk (10/19): 189 m 6 min walk (1/20): 70 m  PMH: 1. Mechanical St Jude aortic valve 10/05: Goal INR 2.5-3.5.  2. CAD: s/p CABG x 2 with AVR in 10/05.  - Cardiolite in 2014 showed no ischemia.  3. HTN 4. Hyperlipidemia 5. Type 2 diabetes 6. PAD: Peripheral arterial dopplers (7/19) with >50% CIA stenosis on right, 70-99% CIA stenosis on the left.  7. H/o CCY 8. Atrial fibrillation: Chronic since at least 4/17. 9. Chronic diastolic CHF with prominent RV dysfunction: Echo (5/19) with EF 32-67%, grade 3 diastolic dysfunction, mechanical aortic valve with mean gradient 10 mmHg, mild MR, moderate  RV dilation with moderate-severely decreased RV systolic function, biatrial enlargement, PASP 88 mmHg.  - Echo (7/20): EF 60-65% with mild LVH, normal RV, mechanical aortic valve with mean gradient 17 mmHg, PASP 34 mmHg.  10. Carotid stenosis: 40-59% BICA stenosis on 7/19 carotid dopplers.  11. Pulmonary hypertension: RHC (9/19) with mean RA 5, PA 63/17 mean 36, mean PCWP 16, CI 4.16, PVR 3.1 WU.  - V/Q scan (10/19): no evidence for chronic PE.  - PFTs (10/19): minimal obstruction.  - ANA+, RF 36.8  (elevated), SCL-70 negative, anti-centromere Ab negative, ds-DNA negative, CCP Ab positive. She saw rheumatology, NOT thought to have RA.  - Echo in 7/20 with normal-appearing RV and PASP 34 mmHg.  - PFTs (7/20) with no significant obstruction/restriction but decreased DLCO.  - PYP scan not suggestive of TTR amyloidosis.  12. 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 History of GI bleeding.  15. 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  - Getting Rituximab and prednisone.   Current Outpatient Medications  Medication Sig Dispense Refill  . acetaminophen (TYLENOL) 325 MG tablet Take 650 mg by mouth every 6 (six) hours as needed for mild pain.     [name] allopurinol (ZYLOPRIM) 100 MG tablet Take 100 mg by mouth daily.    [name] ALPRAZolam (XANAX) 0.5 MG tablet Take 0.25 mg by mouth at bedtime.     [name] amLODipine (NORVASC) 5 MG tablet Take 1 tablet (5 mg total) by mouth daily. 30 tablet 3  . atorvastatin (LIPITOR) 80 MG tablet Take 1 tablet (80 mg total) by mouth daily at 6 PM.    . Cholecalciferol (VITAMIN D3) 1000 UNITS CAPS Take 2,000 Units by mouth daily with lunch.     . cloNIDine (CATAPRES) 0.2 MG tablet Take 1 tablet (0.2 mg total) by mouth 2 (two) times daily. 60 tablet 3  . Cyanocobalamin (VITAMIN B12) 1000 MCG TBCR Take 1,000 mcg by mouth daily with lunch.     . furosemide (LASIX) 40 MG tablet Take 40 mg by mouth 2 (two) times daily.    . hydrALAZINE (APRESOLINE) 100 MG tablet Take 1 tablet (100 mg total) by mouth 3 (three) times daily. 270 tablet 1  . Magnesium 500 MG TABS Take 500 mg by mouth daily.    . magnesium oxide (MAG-OX) 400 (241.3 Mg) MG tablet Take 1 tablet (400 mg total) by mouth daily.    . metFORMIN (GLUCOPHAGE) 500 MG tablet Take 500 mg by mouth 2 (two) times daily with a meal.     . metoprolol tartrate (LOPRESSOR) 25 MG tablet Take 0.5 tablets (12.5 mg total) by mouth 2 (two) times daily. 90 tablet 0  . ondansetron (ZOFRAN ODT)  4 MG disintegrating tablet Take 1 tablet (4 mg total) by mouth every 8 (eight) hours as needed for nausea or vomiting. 6 tablet 0  . pantoprazole (PROTONIX) 40 MG tablet Take 40 mg by mouth 2 (two) times daily.     . predniSONE (DELTASONE) 5 MG tablet Take 2 tablets (10 mg total) by mouth daily with breakfast. 90 tablet 2  . spironolactone (ALDACTONE) 25 MG tablet Take 1 tablet (25 mg total) by mouth daily. 90 tablet 3  . tadalafil, PAH, (ADCIRCA) 20 MG tablet Take 2 tablets (40 mg total) by mouth daily. 60 tablet 6  . traMADol (ULTRAM) 50 MG tablet Take 1 tablet (50 mg total) by mouth every 6 (six) hours as needed. 15 tablet 0  . vitamin C (ASCORBIC ACID) 500 MG tablet Take  500 mg by mouth daily with lunch.    . warfarin (COUMADIN) 5 MG tablet TAKE 1.5 TO 2 TABLETS BY MOUTH DAILY AS DIRECTED BY COUMADIN CLINIC 180 tablet 0  . feeding supplement, GLUCERNA SHAKE, (GLUCERNA SHAKE) LIQD Take 237 mLs by mouth 2 (two) times daily between meals. (Patient not taking: Reported on 10/21/2019) 237 mL 30   No current facility-administered medications for this encounter.    Allergies:   Flagyl [metronidazole], Coreg [carvedilol], Losartan, Verapamil, Zetia [ezetimibe], and Zocor [simvastatin - high dose]   Social History:  The patient  reports that she has quit smoking. Her smoking use included cigarettes. She has a 30.00 pack-year smoking history. She has never used smokeless tobacco. She reports that she does not drink alcohol or use drugs.   Family History:  [name] family history is not on file.   ROS:  Please see the history of present illness.   All other systems are personally reviewed and negative.   Exam:   BP (!) 148/64   Pulse (!) 41   Wt 54.5 kg (120 lb 3.2 oz)   SpO2 98%   BMI 21.98 kg/m   General:  Elderly. No resp difficulty. Ambulated in the clinic with a rolling walker.  HEENT: normal Neck: supple. JPV 6-7. Carotids 2+ bilat; no bruits. No lymphadenopathy or thryomegaly  appreciated. Cor: PMI nondisplaced. Regular rate & rhythm. No rubs, gallops or murmurs. Lungs: clear Abdomen: soft, nontender, nondistended. No hepatosplenomegaly. No bruits or masses. Good bowel sounds. Extremities: no cyanosis, clubbing, rash, R and LLE compression stockings. Trace edema Neuro: alert & orientedx3, cranial nerves grossly intact. moves all 4 extremities w/o difficulty. Affect pleasant  EKG: Sinus Brady 41 bpm   Recent Labs: 05/30/2019: Magnesium 2.1 07/07/2019: B Natriuretic Peptide 311.8 10/13/2019: ALT 18; BUN 64; Creatinine 1.53; Hemoglobin 11.3; Platelets 175; Potassium 3.5; Sodium 136  Personally reviewed   Wt Readings from Last 3 Encounters:  10/21/19 54.5 kg (120 lb 3.2 oz)  10/13/19 54.7 kg (120 lb 11.2 oz)  09/15/19 55.3 kg (121 lb 14.4 oz)     ASSESSMENT AND PLAN:  1. Atrial fibrillation: She has been thought to be in permanent atrial fibrillation in the past, but last few ECGs have shown NSR.  In Sinus brady today.  - Cut back metoprolol to 12.5 mg daily.  Would hold off on anti-arrhythmic meds.   - Continue warfarin, goal INR 2.5-3.5. No ASA with history of GI bleeding.  2. Chronic diastolic CHF: With prominent RV dysfunction and severe pulmonary hypertension by echo in 2019. Filling pressures were well-compensated on 9/19 RHC. Repeat echo in 7/20 showed normal RV size and systolic function and PA systolic pressure down to 34 mmHg.  PYP scan did not suggest ATTR amyloidosis.   NYHA  II-III. Volume status stable. Continue lasix Lasix to 80 mg bid.  3. CAD: No chest pain.   - Continue atorvastatin and warfarin.  4. PAD: Significant on 7/19 dopplers but no claudication, no pedal ulcerations. She will need repeat peripheral arterial dopplers ordered.  5. Mechanical aortic valve: Stable on 7/20 echo.  Given co-existing atrial fibrillation, goal INR is 2.5-3.5.  - Continue warfarin.  - No ASA with GI bleeding.  6. Pulmonary hypertension: Moderate pulmonary  hypertension on 9/19 with RV dysfunction and dilation noted on last echo. PVR only 3.1 WU but cardiac output was high.  V/Q scan in 10/19 with no evidence for chronic PEs and PFTs in 10/19 and again in 7/20 were unremarkable  except for low DLCO.  I think there is a possible group 1 PH component though this is a borderline call as PVR is not significantly elevated. ANA and RF positive, as is CCP Ab.  She saw rheumatology and per her report, was not thought to have rheumatoid arthritis.  Subsequently, she has been diagnosed with p-ANCA+ vasculitis.  CT chest showed subtle bilateral ground glass opacities in 3/20, she is followed by pulmonary.    - Continue Adcirca 40 mg daily.  It was borderline to start this, and so 2nd agent was not started.  Repeat echo in 7/20 showed normal RV and estimated PA systolic pressure 34 mmHg.  - Stable today.  7. Rheumatology: Joint pain, positive RF and CCP Ab.  Lung findings (ground glass, pulmonary hypertension) also thought to be concerning for RA. However, per patient's report, she was by her rheumatologist that did not have RA and did have gout.  More recently, she has been found to have p-ANCA+ vasculitis, eosinophilic granulomatosis with polyangiitis.  She is being treated with rituximab and prednisone by Dr Jana Hakim.   8. HTN: She is on clonidine, spironolactone, and hydralazine.  - Increase 10 mg daily.  - Asked her to record BP.   Follow up in 3 months.  [name] NP-C  2:44 PM  Signed, [name], NP  10/21/2019   [facility] [address] [facility] [place] [address] [phone] (office) [phone] (fax)

## 2019-10-24 NOTE — Progress Notes (Signed)
Spoke to patient on 10/22/19 for a telehealth counseling appt via phone. Pt was mentally sharp and demonstrated a more positive outlook about her life despite feeling tired today. Pt reported having more energy over the past week, but stated that she continues to tire easily and has to break up tasks into small steps and take frequent breaks. Pt and counseling intern discussed her reported feelings of isolation and feelings and experiences that come with getting older. Pt expressed feeling "forgotten" because few people check in on her. Pt stated she was excited because she was expectting a visit from a group of church women today. Additionally pt and counseling intern discussed the patient's emotional pain over losing two daughters, losses the pt reports still cause her significant sadness. Pt stated she is pleased that she has had enough energy to slowly work on clearing out her "old junk". Pt agreed to continue finding ways to do things she enjoys and working on noticing when she is able to have control over some decisions in her life. Pt agreed that counseling is beneficial for her at this time and requested to schedule another appt for next week.   [facility] Counseling Intern Voicemail:  [phone]

## 2019-10-27 ENCOUNTER — Ambulatory Visit: Payer: Medicare HMO

## 2019-10-27 ENCOUNTER — Encounter: Payer: Self-pay | Admitting: Podiatry

## 2019-10-27 ENCOUNTER — Ambulatory Visit: Payer: Self-pay

## 2019-10-27 ENCOUNTER — Other Ambulatory Visit: Payer: Self-pay

## 2019-10-27 ENCOUNTER — Ambulatory Visit: Payer: Medicare HMO | Admitting: Podiatry

## 2019-10-27 DIAGNOSIS — M79675 Pain in left toe(s): Secondary | ICD-10-CM

## 2019-10-27 DIAGNOSIS — B351 Tinea unguium: Secondary | ICD-10-CM

## 2019-10-27 DIAGNOSIS — M79674 Pain in right toe(s): Secondary | ICD-10-CM

## 2019-10-27 NOTE — Patient Instructions (Signed)
Diabetes Mellitus and Foot Care Foot care is an important part of your health, especially when you have diabetes. Diabetes may cause you to have problems because of poor blood flow (circulation) to your feet and legs, which can cause your skin to:  Become thinner and drier.  Break more easily.  Heal more slowly.  Peel and crack. You may also have nerve damage (neuropathy) in your legs and feet, causing decreased feeling in them. This means that you may not notice minor injuries to your feet that could lead to more serious problems. Noticing and addressing any potential problems early is the best way to prevent future foot problems. How to care for your feet Foot hygiene  Wash your feet daily with warm water and mild soap. Do not use hot water. Then, pat your feet and the areas between your toes until they are completely dry. Do not soak your feet as this can dry your skin.  Trim your toenails straight across. Do not dig under them or around the cuticle. File the edges of your nails with an emery board or nail file.  Apply a moisturizing lotion or petroleum jelly to the skin on your feet and to dry, brittle toenails. Use lotion that does not contain alcohol and is unscented. Do not apply lotion between your toes. Shoes and socks  Wear clean socks or stockings every day. Make sure they are not too tight. Do not wear knee-high stockings since they may decrease blood flow to your legs.  Wear shoes that fit properly and have enough cushioning. Always look in your shoes before you put them on to be sure there are no objects inside.  To break in new shoes, wear them for just a few hours a day. This prevents injuries on your feet. Wounds, scrapes, corns, and calluses  Check your feet daily for blisters, cuts, bruises, sores, and redness. If you cannot see the bottom of your feet, use a mirror or ask someone for help.  Do not cut corns or calluses or try to remove them with medicine.  If you  find a minor scrape, cut, or break in the skin on your feet, keep it and the skin around it clean and dry. You may clean these areas with mild soap and water. Do not clean the area with peroxide, alcohol, or iodine.  If you have a wound, scrape, corn, or callus on your foot, look at it several times a day to make sure it is healing and not infected. Check for: ? Redness, swelling, or pain. ? Fluid or blood. ? Warmth. ? Pus or a bad smell. General instructions  Do not cross your legs. This may decrease blood flow to your feet.  Do not use heating pads or hot water bottles on your feet. They may burn your skin. If you have lost feeling in your feet or legs, you may not know this is happening until it is too late.  Protect your feet from hot and cold by wearing shoes, such as at the beach or on hot pavement.  Schedule a complete foot exam at least once a year (annually) or more often if you have foot problems. If you have foot problems, report any cuts, sores, or bruises to your health care provider immediately. Contact a health care provider if:  You have a medical condition that increases your risk of infection and you have any cuts, sores, or bruises on your feet.  You have an injury that is not   healing.  You have redness on your legs or feet.  You feel burning or tingling in your legs or feet.  You have pain or cramps in your legs and feet.  Your legs or feet are numb.  Your feet always feel cold.  You have pain around a toenail. Get help right away if:  You have a wound, scrape, corn, or callus on your foot and: ? You have pain, swelling, or redness that gets worse. ? You have fluid or blood coming from the wound, scrape, corn, or callus. ? Your wound, scrape, corn, or callus feels warm to the touch. ? You have pus or a bad smell coming from the wound, scrape, corn, or callus. ? You have a fever. ? You have a red line going up your leg. Summary  Check your feet every day  for cuts, sores, red spots, swelling, and blisters.  Moisturize feet and legs daily.  Wear shoes that fit properly and have enough cushioning.  If you have foot problems, report any cuts, sores, or bruises to your health care provider immediately.  Schedule a complete foot exam at least once a year (annually) or more often if you have foot problems. This information is not intended to replace advice given to you by your health care provider. Make sure you discuss any questions you have with your health care provider. Document Revised: 06/18/2019 Document Reviewed: 10/27/2016 Elsevier Patient Education  2020 [employer]  

## 2019-10-27 NOTE — Telephone Encounter (Signed)
[facility] Patient Advocate Encounter  Prior Authorization for Tadalafil has been approved.    PA# [id number] Effective dates: 10/10/19 through 10/08/20  Charlann Boxer, CPhT

## 2019-10-28 ENCOUNTER — Inpatient Hospital Stay: Payer: Medicare HMO

## 2019-10-28 ENCOUNTER — Ambulatory Visit (INDEPENDENT_AMBULATORY_CARE_PROVIDER_SITE_OTHER): Payer: Medicare HMO | Admitting: Pharmacist

## 2019-10-28 ENCOUNTER — Other Ambulatory Visit: Payer: Self-pay

## 2019-10-28 DIAGNOSIS — I739 Peripheral vascular disease, unspecified: Secondary | ICD-10-CM

## 2019-10-28 DIAGNOSIS — I776 Arteritis, unspecified: Secondary | ICD-10-CM | POA: Diagnosis not present

## 2019-10-28 DIAGNOSIS — Z7901 Long term (current) use of anticoagulants: Secondary | ICD-10-CM | POA: Diagnosis not present

## 2019-10-28 DIAGNOSIS — N189 Chronic kidney disease, unspecified: Secondary | ICD-10-CM | POA: Diagnosis not present

## 2019-10-28 DIAGNOSIS — M8580 Other specified disorders of bone density and structure, unspecified site: Secondary | ICD-10-CM | POA: Diagnosis not present

## 2019-10-28 DIAGNOSIS — Z5112 Encounter for antineoplastic immunotherapy: Secondary | ICD-10-CM | POA: Diagnosis not present

## 2019-10-28 DIAGNOSIS — Z952 Presence of prosthetic heart valve: Secondary | ICD-10-CM

## 2019-10-28 DIAGNOSIS — D696 Thrombocytopenia, unspecified: Secondary | ICD-10-CM

## 2019-10-28 DIAGNOSIS — D594 Other nonautoimmune hemolytic anemias: Secondary | ICD-10-CM

## 2019-10-28 DIAGNOSIS — D631 Anemia in chronic kidney disease: Secondary | ICD-10-CM | POA: Diagnosis not present

## 2019-10-28 DIAGNOSIS — D508 Other iron deficiency anemias: Secondary | ICD-10-CM

## 2019-10-28 LAB — CMP (CANCER CENTER ONLY)
ALT: 16 U/L (ref 0–44)
AST: 28 U/L (ref 15–41)
Albumin: 3.7 g/dL (ref 3.5–5.0)
Alkaline Phosphatase: 85 U/L (ref 38–126)
Anion gap: 10 (ref 5–15)
BUN: 69 mg/dL — ABNORMAL HIGH (ref 8–23)
CO2: 24 mmol/L (ref 22–32)
Calcium: 9.5 mg/dL (ref 8.9–10.3)
Chloride: 101 mmol/L (ref 98–111)
Creatinine: 1.69 mg/dL — ABNORMAL HIGH (ref 0.44–1.00)
GFR, Est AFR Am: 33 mL/min — ABNORMAL LOW (ref >60)
GFR, Estimated: 28 mL/min — ABNORMAL LOW (ref >60)
Glucose, Bld: 134 mg/dL — ABNORMAL HIGH (ref 70–99)
Potassium: 4.3 mmol/L (ref 3.5–5.1)
Sodium: 135 mmol/L (ref 135–145)
Total Bilirubin: 0.4 mg/dL (ref 0.3–1.2)
Total Protein: 6.6 g/dL (ref 6.5–8.1)

## 2019-10-28 LAB — RETICULOCYTES
Immature Retic Fract: 6.2 % (ref 2.3–15.9)
RBC.: 4.04 MIL/uL (ref 3.87–5.11)
Retic Count, Absolute: 32.3 10*3/uL (ref 19.0–186.0)
Retic Ct Pct: 0.8 % (ref 0.4–3.1)

## 2019-10-28 LAB — POCT INR: INR: 1.3 — AB (ref 2.0–3.0)

## 2019-10-28 LAB — CBC WITH DIFFERENTIAL/PLATELET
Abs Immature Granulocytes: 0.03 10*3/uL (ref 0.00–0.07)
Basophils Absolute: 0.1 10*3/uL (ref 0.0–0.1)
Basophils Relative: 1 %
Eosinophils Absolute: 0.2 10*3/uL (ref 0.0–0.5)
Eosinophils Relative: 3 %
HCT: 35 % — ABNORMAL LOW (ref 36.0–46.0)
Hemoglobin: 11.2 g/dL — ABNORMAL LOW (ref 12.0–15.0)
Immature Granulocytes: 1 %
Lymphocytes Relative: 20 %
Lymphs Abs: 1.3 10*3/uL (ref 0.7–4.0)
MCH: 27.4 pg (ref 26.0–34.0)
MCHC: 32 g/dL (ref 30.0–36.0)
MCV: 85.6 fL (ref 80.0–100.0)
Monocytes Absolute: 0.6 10*3/uL (ref 0.1–1.0)
Monocytes Relative: 9 %
Neutro Abs: 4.3 10*3/uL (ref 1.7–7.7)
Neutrophils Relative %: 66 %
Platelets: 166 10*3/uL (ref 150–400)
RBC: 4.09 MIL/uL (ref 3.87–5.11)
RDW: 17.8 % — ABNORMAL HIGH (ref 11.5–15.5)
WBC: 6.5 10*3/uL (ref 4.0–10.5)
nRBC: 0 % (ref 0.0–0.2)

## 2019-10-28 LAB — IRON AND TIBC
Iron: 105 ug/dL (ref 41–142)
Saturation Ratios: 39 % (ref 21–57)
TIBC: 269 ug/dL (ref 236–444)
UIBC: 164 ug/dL (ref 120–384)

## 2019-10-28 LAB — FERRITIN: Ferritin: 257 ng/mL (ref 11–307)

## 2019-10-28 LAB — SAVE SMEAR(SSMR), FOR PROVIDER SLIDE REVIEW

## 2019-10-28 NOTE — Patient Outreach (Signed)
[facility]) Care Management  10/28/2019  [name] 06/22/1941 [id number]   Telephone call to patient for monthly check in. No answer. HIPAA compliant voice message left.    Plan: RN CM will attempt patient again in the month of February.    [name], RN, MSN [facility] Care Management Coordinator Direct Line [phone] Cell [phone] Toll Free: [phone]  Fax: [phone]

## 2019-10-28 NOTE — Progress Notes (Signed)
Pt did not meet parameters for injection. Hgb 11.2. Lab results given to pt and advised to call with any concerns and to f/u as scheduled

## 2019-10-29 NOTE — Telephone Encounter (Signed)
No entry 

## 2019-10-29 NOTE — Progress Notes (Signed)
Counseling intern spoke to the patient on 10/29/2019 for a counseling session via phone. Pt's demeanor was upbeat and she stated she has noted continued improvement in her physical health over the past week and possibly some improvement in her mental health as a result. Counseling intern provided therapeutic listening, empathy, compassion, and helped pt focus on growth and goals during the session. Pt reported several events she felt positive about. First, she was able to get her hair done last Friday and this boosted her self-confidence. Pt also reported having enough energy this week to do small tasks to prepare for her carpet installation. Pt and counseling intern discussed her acceptance of her level of energy still being much lower than she would like and she recognized she is also grateful for the energy she does have and still hopeful for more imrovement. Pt stated she understands that she needs to break up projects into small tasks as we discussed in previous weeks and this strategy is currently working for her. Pt values her independence and is grateful for each task she is able to complete for herself including walking to the mailbox, making a meal, and cleaning the house. Pt did report continued feelings of loneliness and stated keeping herself busy is the only way to distract her from it. Pt reflected on memories of visitors coming by her house over the years for sewing work and staying to chat and visit with her and she reflected that she misses these interactions. Pt also values helping others and is finding new ways to continue to do this given her current restrictions.   Next Counseling Appointment: Wednesday, November 05, 2019 at 9:30am  Art Buff [place] Intern Voicemail:  [phone]

## 2019-11-01 NOTE — Progress Notes (Signed)
Subjective: [name] presents today for follow up of preventative diabetic foot care: and painful mycotic nails b/l that are difficult to trim. Pain interferes with ambulation. Aggravating factors include wearing enclosed shoe gear. Pain is relieved with periodic professional debridement.   Allergies  Allergen Reactions  . Flagyl [Metronidazole] Rash    ALL-OVER BODY RASH  . Coreg [Carvedilol] Other (See Comments)    Terrible cramping in the feet and had a lot of bowel movements, but not diarrhea  . Losartan Swelling    Patient doesn't recall site of swelling  . Verapamil Hives  . Zetia [Ezetimibe] Other (See Comments)    Reaction not recalled  . Zocor [Simvastatin - High Dose] Other (See Comments)    Reaction not recalled     Objective: There were no vitals filed for this visit.  Vascular Examination:  capillary fill time to digits <3s b/l, palpable DP pulses b/l, palpable PT pulses b/l, pedal hair absent b/l and skin temperature gradient within normal limits b/l  Dermatological Examination: Pedal skin is thin shiny, atrophic bilaterally., No open wounds bilaterally. and No interdigital macerations bilaterally.  ingrown nail at b/l borders b/l great toes with no erythema, no edema, no drainage, no flocculence.  onychomycosis of the toenails b/l with elongation, dystrophy, discoloration, thickening, subungual debris and pain to palpation of nail beds  Musculoskeletal: normal muscle strength 5/5 to all lower extremity muscle groups bilaterally, no gross bony deformities bilaterally and no pain crepitus or joint limitation noted with ROM  Neurological: sensation intact 5/5 intact bilaterally with 10g monofilament and vibratory sensation intact  Assessment: 1. Pain due to onychomycosis of toenails of both feet      Plan: -Toenails 1-5 b/l were debrided in length and girth without iatrogenic bleeding. -Patient to continue soft, supportive shoe gear daily. -Patient to  report any pedal injuries to medical professional immediately. -Patient/POA to call should there be question/concern in the interim.  Return in about 9 weeks (around 12/29/2019).

## 2019-11-05 NOTE — Progress Notes (Signed)
Spoke to the patient on 11/04/19 for a weekly telehealth counseling session via phone. Pt presented to counseling more upbeat. She reported continuing to feel "a little better" physically, but stated she still has to do small bits of tasks at a time and then rest.Pt reports continued feelings of loneliness, but stated she is able to see a few of her children each week. Pt stated she is pleased to continue working to clean "junk" out of her house to prepare for eher carpet installation, but needs help from family to get this done. Pt was more upbeat during the duration of the call and reported counseling continues to be helpful as a space to talk about her challenges and think about new coping strategies to promote well being.   Next Counseling Appt: Wednesday, November 12, 2019 at 9:30am via phone  Verlan Friends. [place] Counseling Intern Voicemail: [phone]

## 2019-11-07 ENCOUNTER — Ambulatory Visit: Payer: Self-pay | Admitting: Pharmacist

## 2019-11-10 ENCOUNTER — Inpatient Hospital Stay: Payer: Medicare HMO | Attending: Oncology

## 2019-11-10 ENCOUNTER — Inpatient Hospital Stay: Payer: Medicare HMO

## 2019-11-10 ENCOUNTER — Other Ambulatory Visit: Payer: Self-pay

## 2019-11-10 ENCOUNTER — Other Ambulatory Visit: Payer: Self-pay | Admitting: *Deleted

## 2019-11-10 VITALS — BP 147/87 | HR 52 | Temp 97.6°F | Resp 18

## 2019-11-10 DIAGNOSIS — N189 Chronic kidney disease, unspecified: Secondary | ICD-10-CM | POA: Insufficient documentation

## 2019-11-10 DIAGNOSIS — I7782 Antineutrophilic cytoplasmic antibody (ANCA) vasculitis: Secondary | ICD-10-CM

## 2019-11-10 DIAGNOSIS — Z5112 Encounter for antineoplastic immunotherapy: Secondary | ICD-10-CM | POA: Diagnosis not present

## 2019-11-10 DIAGNOSIS — I776 Arteritis, unspecified: Secondary | ICD-10-CM

## 2019-11-10 DIAGNOSIS — D594 Other nonautoimmune hemolytic anemias: Secondary | ICD-10-CM

## 2019-11-10 DIAGNOSIS — D696 Thrombocytopenia, unspecified: Secondary | ICD-10-CM

## 2019-11-10 DIAGNOSIS — D508 Other iron deficiency anemias: Secondary | ICD-10-CM

## 2019-11-10 DIAGNOSIS — I739 Peripheral vascular disease, unspecified: Secondary | ICD-10-CM

## 2019-11-10 DIAGNOSIS — D61818 Other pancytopenia: Secondary | ICD-10-CM

## 2019-11-10 DIAGNOSIS — D631 Anemia in chronic kidney disease: Secondary | ICD-10-CM | POA: Insufficient documentation

## 2019-11-10 DIAGNOSIS — D500 Iron deficiency anemia secondary to blood loss (chronic): Secondary | ICD-10-CM

## 2019-11-10 LAB — CMP (CANCER CENTER ONLY)
ALT: 18 U/L (ref 0–44)
AST: 26 U/L (ref 15–41)
Albumin: 3.8 g/dL (ref 3.5–5.0)
Alkaline Phosphatase: 74 U/L (ref 38–126)
Anion gap: 11 (ref 5–15)
BUN: 81 mg/dL — ABNORMAL HIGH (ref 8–23)
CO2: 22 mmol/L (ref 22–32)
Calcium: 9.3 mg/dL (ref 8.9–10.3)
Chloride: 102 mmol/L (ref 98–111)
Creatinine: 1.79 mg/dL — ABNORMAL HIGH (ref 0.44–1.00)
GFR, Est AFR Am: 30 mL/min — ABNORMAL LOW (ref >60)
GFR, Estimated: 26 mL/min — ABNORMAL LOW (ref >60)
Glucose, Bld: 142 mg/dL — ABNORMAL HIGH (ref 70–99)
Potassium: 4 mmol/L (ref 3.5–5.1)
Sodium: 135 mmol/L (ref 135–145)
Total Bilirubin: 0.4 mg/dL (ref 0.3–1.2)
Total Protein: 6.6 g/dL (ref 6.5–8.1)

## 2019-11-10 LAB — CBC WITH DIFFERENTIAL/PLATELET
Abs Immature Granulocytes: 0.04 10*3/uL (ref 0.00–0.07)
Basophils Absolute: 0.1 10*3/uL (ref 0.0–0.1)
Basophils Relative: 1 %
Eosinophils Absolute: 0.2 10*3/uL (ref 0.0–0.5)
Eosinophils Relative: 3 %
HCT: 31.5 % — ABNORMAL LOW (ref 36.0–46.0)
Hemoglobin: 10.3 g/dL — ABNORMAL LOW (ref 12.0–15.0)
Immature Granulocytes: 1 %
Lymphocytes Relative: 19 %
Lymphs Abs: 1.2 10*3/uL (ref 0.7–4.0)
MCH: 27.3 pg (ref 26.0–34.0)
MCHC: 32.7 g/dL (ref 30.0–36.0)
MCV: 83.6 fL (ref 80.0–100.0)
Monocytes Absolute: 0.5 10*3/uL (ref 0.1–1.0)
Monocytes Relative: 8 %
Neutro Abs: 4.3 10*3/uL (ref 1.7–7.7)
Neutrophils Relative %: 68 %
Platelets: 152 10*3/uL (ref 150–400)
RBC: 3.77 MIL/uL — ABNORMAL LOW (ref 3.87–5.11)
RDW: 17.9 % — ABNORMAL HIGH (ref 11.5–15.5)
WBC: 6.3 10*3/uL (ref 4.0–10.5)
nRBC: 0 % (ref 0.0–0.2)

## 2019-11-10 LAB — IRON AND TIBC
Iron: 110 ug/dL (ref 41–142)
Saturation Ratios: 43 % (ref 21–57)
TIBC: 256 ug/dL (ref 236–444)
UIBC: 147 ug/dL (ref 120–384)

## 2019-11-10 LAB — RETICULOCYTES
Immature Retic Fract: 4.7 % (ref 2.3–15.9)
RBC.: 3.75 MIL/uL — ABNORMAL LOW (ref 3.87–5.11)
Retic Count, Absolute: 35.3 10*3/uL (ref 19.0–186.0)
Retic Ct Pct: 0.9 % (ref 0.4–3.1)

## 2019-11-10 LAB — SAVE SMEAR(SSMR), FOR PROVIDER SLIDE REVIEW

## 2019-11-10 LAB — FERRITIN: Ferritin: 188 ng/mL (ref 11–307)

## 2019-11-10 MED ORDER — SODIUM CHLORIDE 0.9 % IV SOLN
375.0000 mg/m2 | Freq: Once | INTRAVENOUS | Status: AC
  Administered 2019-11-10: 600 mg via INTRAVENOUS
  Filled 2019-11-10: qty 50

## 2019-11-10 MED ORDER — SODIUM CHLORIDE 0.9 % IV SOLN
Freq: Once | INTRAVENOUS | Status: AC
  Filled 2019-11-10: qty 250

## 2019-11-10 MED ORDER — ACETAMINOPHEN 325 MG PO TABS
650.0000 mg | ORAL_TABLET | Freq: Once | ORAL | Status: AC
  Administered 2019-11-10: 650 mg via ORAL

## 2019-11-10 MED ORDER — DIPHENHYDRAMINE HCL 25 MG PO CAPS
ORAL_CAPSULE | ORAL | Status: AC
  Filled 2019-11-10: qty 1

## 2019-11-10 MED ORDER — ACETAMINOPHEN 325 MG PO TABS
ORAL_TABLET | ORAL | Status: AC
  Filled 2019-11-10: qty 2

## 2019-11-10 MED ORDER — DIPHENHYDRAMINE HCL 25 MG PO CAPS
25.0000 mg | ORAL_CAPSULE | Freq: Once | ORAL | Status: AC
  Administered 2019-11-10: 25 mg via ORAL

## 2019-11-10 MED ORDER — EPOETIN ALFA-EPBX 40000 UNIT/ML IJ SOLN
INTRAMUSCULAR | Status: AC
  Filled 2019-11-10: qty 1

## 2019-11-10 MED ORDER — EPOETIN ALFA-EPBX 40000 UNIT/ML IJ SOLN
40000.0000 [IU] | Freq: Once | INTRAMUSCULAR | Status: AC
  Administered 2019-11-10: 40000 [IU] via SUBCUTANEOUS

## 2019-11-10 NOTE — Patient Instructions (Signed)
[facility] °Discharge Instructions for Patients Receiving Chemotherapy ° °Today you received the following chemotherapy agents: Rituximab ° °To help prevent nausea and vomiting after your treatment, we encourage you to take your nausea medication as directed.  °  °If you develop nausea and vomiting that is not controlled by your nausea medication, call the clinic.  ° °BELOW ARE SYMPTOMS THAT SHOULD BE REPORTED IMMEDIATELY: °· *FEVER GREATER THAN 100.5 F °· *CHILLS WITH OR WITHOUT FEVER °· NAUSEA AND VOMITING THAT IS NOT CONTROLLED WITH YOUR NAUSEA MEDICATION °· *UNUSUAL SHORTNESS OF BREATH °· *UNUSUAL BRUISING OR BLEEDING °· TENDERNESS IN MOUTH AND THROAT WITH OR WITHOUT PRESENCE OF ULCERS °· *URINARY PROBLEMS °· *BOWEL PROBLEMS °· UNUSUAL RASH °Items with * indicate a potential emergency and should be followed up as soon as possible. ° °Feel free to call the clinic should you have any questions or concerns. The clinic phone number is [phone]. ° °Please show the CHEMO ALERT CARD at check-in to the Emergency Department and triage nurse. ° ° °

## 2019-11-10 NOTE — Progress Notes (Signed)
Pt switched to Rapid Rituximab as she tol last inf w/o problems (per MD)  Kennith Center, Pharm.D., CPP 11/10/2019@1 :04 PM

## 2019-11-12 NOTE — Progress Notes (Signed)
Counseling intern spoke to patient on 11/12/19 for a telehealth counseling session via phone. Pt stated she is in a down mood and reported she had not slept well recently. Pt stated the down mood and poor sleep resulted from a cycle of negative thoughts about her lack of independence and her desire for additional support from her family. Pt stated she had a issue on Saturday that caused her disappointment, anger and sadness and that this incident triggered this down mood and that she has been in the cycle of negativity since then. Pt stated she understands that her family is busy, but she needs additional support. Counseling intern provided space for pt to discuss challenges and CI responded with empathy, compassion, and help normalizing feelings. CI and pt discussed strategies: 1)Thought Replacement - to stop her cycyle of negative thoughts pt will recognize the unhelpful thought and replace it with a helpful thought  2) Creating a List - pt agreed to make a list of things she would like to get done. Pt will add to the list when she finds her stuck thinking about something that she wants done and will share the list with family members. Pt agreed to try these strategies this week.  Pt stated she will use the list when family comes over to help. Additionally, pt reported she is able to find "moments of joy" as CI and pt discussed in previous sessions and stated making, eating, and sharing banana pudding over the weekend brought her a lot of pleasure. Finally, pt reported "doing what I can" to do small tasks around her house, which provides her with "moments of control and independence". Pt reported counseling sessions are helpful and she agreed to schedule another appointment.   Next Counseling Appt: Friday, November 21, 2019 at 10:30 am via phone  Art Buff [facility] Counseling Intern Voicemail:  [phone]

## 2019-11-13 ENCOUNTER — Encounter (HOSPITAL_COMMUNITY): Payer: Medicare HMO

## 2019-11-14 ENCOUNTER — Other Ambulatory Visit: Payer: Self-pay

## 2019-11-14 ENCOUNTER — Ambulatory Visit (INDEPENDENT_AMBULATORY_CARE_PROVIDER_SITE_OTHER): Payer: Medicare HMO | Admitting: Pharmacist

## 2019-11-14 ENCOUNTER — Other Ambulatory Visit: Payer: Self-pay | Admitting: Pharmacist

## 2019-11-14 DIAGNOSIS — Z7901 Long term (current) use of anticoagulants: Secondary | ICD-10-CM | POA: Diagnosis not present

## 2019-11-14 DIAGNOSIS — Z952 Presence of prosthetic heart valve: Secondary | ICD-10-CM | POA: Diagnosis not present

## 2019-11-14 LAB — POCT INR: INR: 1.7 — AB (ref 2.0–3.0)

## 2019-11-14 NOTE — Patient Outreach (Signed)
[facility]) Care Management  11/14/2019  [name] 09-04-1941 [id number]   Patient was called for follow up. HIPAA identifiers were obtained.  Patient is an 79 year old female with multiple medical conditions including but not limited to:  anemia, thrombocytopenia, hypertension, hyperlipidemia, type 2 diabetes, CAD status post CABG, vasculitis, low extremity edema, anxiety, CKD stage III, GERD, A Fib, and PAD.    She received Rituximab infusion on 2/1/2021and reported feeling well today.  She also saw the Coumadin Clinic today. Her INR was 1.7 goal (2-3). Her dose was titrated to Warfarin 5 mg 2 tablets today then resume 1&1/2 tablets daily except take 1 tablet on Wednesday.  She has an appointment to have her INR rechecked on 12/03/2019.  HgA1c-4.6 (02/2019)--with patient's anemia, it is unclear if this value can be trusted.  Medications Reviewed Today    Reviewed by [name], [facility] (Pharmacist) on 11/14/19 at 1638  Med List Status: <None>  Medication Order Taking? Sig Documenting Provider Last Dose Status Informant  acetaminophen (TYLENOL) 325 MG tablet [id number] Yes Take 650 mg by mouth every 6 (six) hours as needed for mild pain.  [provider] Taking Active Self  allopurinol (ZYLOPRIM) 100 MG tablet [id number] Yes Take 100 mg by mouth daily. [provider] Taking Active Self  ALPRAZolam (XANAX) 0.5 MG tablet [id number] Yes Take 0.25 mg by mouth at bedtime.  [provider] Taking Active Self           Med Note [facility], [name]   Thu Sep 11, 2019 12:02 PM)    amLODipine (NORVASC) 10 MG tablet [id number] Yes Take 1 tablet (10 mg total) by mouth daily. [name] D, NP Taking Active   atorvastatin (LIPITOR) 80 MG tablet [id number] Yes Take 1 tablet (80 mg total) by mouth daily at 6 PM. [name] August, MD Taking Active Self  Cholecalciferol (VITAMIN D3) 1000 UNITS CAPS [id number] Yes Take 2,000 Units by mouth daily with lunch.  [provider] Taking Active Self           Med Note [name]   Fri Jun 27, 2019 12:31 PM)    cloNIDine (CATAPRES) 0.2 MG tablet [id number] Yes Take 1 tablet (0.2 mg total) by mouth 2 (two) times daily. [name] Dresser, MD Taking Active   Cyanocobalamin (VITAMIN B12) 1000 MCG TBCR [id number] Yes Take 1,000 mcg by mouth daily with lunch.  [provider] Taking Active Self  furosemide (LASIX) 40 MG tablet [id number] Yes Take 40 mg by mouth 2 (two) times daily. [provider] Taking Active   hydrALAZINE (APRESOLINE) 100 MG tablet [id number] Yes Take 1 tablet (100 mg total) by mouth 3 (three) times daily. [name] Dresser, MD Taking Active Self  lactose free nutrition (BOOST) LIQD [id number] Yes Take 237 mLs by mouth daily. (To supplement meals) [provider] Taking Active   Magnesium 500 MG TABS [id number] Yes Take 500 mg by mouth daily. [provider] Taking Active Self  magnesium oxide (MAG-OX) 400 (241.3 Mg) MG tablet [id number] Yes Take 1 tablet (400 mg total) by mouth daily. [name] Shell, MD Taking Active Self           Med Note [name] Jul 07, 2019  3:42 AM)    metFORMIN (GLUCOPHAGE) 500 MG tablet [id number] Yes Take 500 mg by mouth 2 (two) times daily with a meal.  [provider] Taking Active Self  metoprolol tartrate (LOPRESSOR)  25 MG tablet [id number] Yes Take 0.5 tablets (12.5 mg total) by mouth daily. [name] D, NP Taking Active   ondansetron (ZOFRAN ODT) 4 MG disintegrating tablet [id number] Yes Take 1 tablet (4 mg total) by mouth every 8 (eight) hours as needed for nausea or vomiting. [name], [place] E, PA-C Taking Active Self           Med Note [name] Jul 07, 2019  3:49 AM)    pantoprazole (PROTONIX) 40 MG tablet [id number] Yes Take 40 mg by mouth 2 (two) times daily.  [provider] Taking Active Self           Med Note [name]   Wed Jun 20, 2017 10:33 AM)    predniSONE  (DELTASONE) 5 MG tablet [id number] Yes Take 2 tablets (10 mg total) by mouth daily with breakfast. Magrinat, [name] Dad, MD Taking Active   spironolactone (ALDACTONE) 25 MG tablet [id number] Yes Take 1 tablet (25 mg total) by mouth daily. [name] Dresser, MD Taking Active   tadalafil, PAH, (ADCIRCA) 20 MG tablet [id number] Yes Take 2 tablets (40 mg total) by mouth daily. [name] Dresser, MD Taking Active Self  traMADol [name]) 50 MG tablet [id number] Yes Take 1 tablet (50 mg total) by mouth every 6 (six) hours as needed. Magrinat, [name] Dad, MD Taking Active   vitamin C (ASCORBIC ACID) 500 MG tablet [id number] Yes Take 500 mg by mouth daily with lunch. [provider] Taking Active Self  warfarin (COUMADIN) 5 MG tablet [id number] Yes TAKE 1.5 TO 2 TABLETS BY MOUTH DAILY AS DIRECTED BY COUMADIN CLINIC [name] Harp, MD Taking Active          Patient reported feeling well today and not having any complaints.  Plan: Call patient back in 1 month. [name], PharmD, [place] Clinical Pharmacist [phone]

## 2019-11-21 NOTE — Progress Notes (Signed)
Spoke to patient on 11/21/2019 via phone for a telehealth counseling session. Counseling intern provided space for pt to reflect about her experiences and responded with empathy, compassion, and normalization of feelings. Pt stated she will get her first Covid-19 vaccination today. She stated she is feeling "down" because the weather is cloudy, rainy, and cold so she is not looking forward to going out. Pt reported feeling lonely over the weekend because of not seeing anyone. Pt expressed she is only able to reach out to family for specific needs such as going to an appointment, but that she doesn't share her difficulties with feeling lonely every weekend. Pt stated she did see a few of her children, one granddaughter, and some ladies from church during the week, which she enjoyed. However, pt stated she is worried about her youngest granddaughter because of the amount of stress put on her to help her family with bills, save money for college, and take classes in high school for college credit. Pt reports feeling excited that her bathroom is currently being renovated, but also feels stressed about having the workers in her house. Additionally, pt feels that most of her family has very little time for her which lead to her lack of self-worth, increased sadness, and feeling alone and disconnected. CI plans to work with pt on strategies that can make her weekends more enjoyable and decrease her feelings of loneliness.  Next Scheduled Counseling Session: Tuesday, November 25, 2019 via phone  Art Buff [facility] Counseling Intern Voicemail:  [phone]

## 2019-11-25 ENCOUNTER — Other Ambulatory Visit: Payer: Self-pay

## 2019-11-25 NOTE — Patient Outreach (Addendum)
[facility]) Care Management  [place]  11/25/2019   [name] October 18, 1940 [id number]  Subjective: Telephone call to patient.  She states she is doing ok.  She reports that she had her first COVID vaccine  On 11-21-19.  Patient not sure of which vaccine she received.  She states she already has her appointment for her second vaccine scheduled.  Discussed with patient COVID-19 precautions.  She verbalized understanding. Patient continues to have low energy but paces herself with activities.  She is excited about bathroom renovations but worried to have strangers in her home.  She states that her family is supportive and that sometimes she does feel lonely.  She states she talks to a counselor regularly and that helps her deal with her loneliness.  She states her family visits at least once a week and one of her daughters calls everyday.  Patient reports she continues to weigh but not daily. She states her last weight was 121 lbs. She states she wears TED hose that help with her swelling. She denies any shortness of breath.  Discussed heart failure and symptoms. She verbalized understanding and denies any needs.    Objective:   Encounter Medications:  Outpatient Encounter Medications as of 11/25/2019  Medication Sig  . acetaminophen (TYLENOL) 325 MG tablet Take 650 mg by mouth every 6 (six) hours as needed for mild pain.   [name] allopurinol (ZYLOPRIM) 100 MG tablet Take 100 mg by mouth daily.  [name] ALPRAZolam (XANAX) 0.5 MG tablet Take 0.25 mg by mouth at bedtime.   [name] amLODipine (NORVASC) 10 MG tablet Take 1 tablet (10 mg total) by mouth daily.  [name] atorvastatin (LIPITOR) 80 MG tablet Take 1 tablet (80 mg total) by mouth daily at 6 PM.  . Cholecalciferol (VITAMIN D3) 1000 UNITS CAPS Take 2,000 Units by mouth daily with lunch.   . cloNIDine (CATAPRES) 0.2 MG tablet Take 1 tablet (0.2 mg total) by mouth 2 (two) times daily.  . Cyanocobalamin (VITAMIN B12) 1000 MCG TBCR Take 1,000  mcg by mouth daily with lunch.   . furosemide (LASIX) 40 MG tablet Take 40 mg by mouth 2 (two) times daily.  . hydrALAZINE (APRESOLINE) 100 MG tablet Take 1 tablet (100 mg total) by mouth 3 (three) times daily.  [name] lactose free nutrition (BOOST) LIQD Take 237 mLs by mouth daily. (To supplement meals)  . Magnesium 500 MG TABS Take 500 mg by mouth daily.  . magnesium oxide (MAG-OX) 400 (241.3 Mg) MG tablet Take 1 tablet (400 mg total) by mouth daily.  . metFORMIN (GLUCOPHAGE) 500 MG tablet Take 500 mg by mouth 2 (two) times daily with a meal.   . metoprolol tartrate (LOPRESSOR) 25 MG tablet Take 0.5 tablets (12.5 mg total) by mouth daily.  . ondansetron (ZOFRAN ODT) 4 MG disintegrating tablet Take 1 tablet (4 mg total) by mouth every 8 (eight) hours as needed for nausea or vomiting.  . pantoprazole (PROTONIX) 40 MG tablet Take 40 mg by mouth 2 (two) times daily.   . predniSONE (DELTASONE) 5 MG tablet Take 2 tablets (10 mg total) by mouth daily with breakfast.  . spironolactone (ALDACTONE) 25 MG tablet Take 1 tablet (25 mg total) by mouth daily.  . tadalafil, PAH, (ADCIRCA) 20 MG tablet Take 2 tablets (40 mg total) by mouth daily.  . traMADol (ULTRAM) 50 MG tablet Take 1 tablet (50 mg total) by mouth every 6 (six) hours as needed.  . vitamin C (ASCORBIC ACID) 500  MG tablet Take 500 mg by mouth daily with lunch.  . warfarin (COUMADIN) 5 MG tablet TAKE 1.5 TO 2 TABLETS BY MOUTH DAILY AS DIRECTED BY COUMADIN CLINIC   No facility-administered encounter medications on file as of 11/25/2019.    Functional Status:  In your present state of health, do you have any difficulty performing the following activities: 07/31/2019  Hearing? N  Vision? N  Difficulty concentrating or making decisions? N  Walking or climbing stairs? Y  Comment weakness  Dressing or bathing? N  Doing errands, shopping? Y  Comment son or neighbor Office manager and eating ? N  Using the Toilet? N  In the past six  months, have you accidently leaked urine? Y  Comment occasional  Do you have problems with loss of bowel control? N  Managing your Medications? N  Managing your Finances? N  Housekeeping or managing your Housekeeping? Y  Comment family assists  Some recent data might be hidden    Fall/Depression Screening: Fall Risk  11/25/2019 07/31/2019 03/07/2019  Falls in the past year? 0 0 1  Comment - - -  Number falls in past yr: - - 1  Injury with Fall? - - 1  Risk for fall due to : - - History of fall(s)  Risk for fall due to: Comment - - -  Follow up - - Falls prevention discussed   PHQ 2/9 Scores 11/25/2019 07/31/2019 05/12/2019 03/24/2019 07/09/2018 06/13/2018 04/04/2018  PHQ - 2 Score 1 2 3 1  0 0 0  PHQ- 9 Score - 9 9 - - - -    Assessment: Patient continues to manage chronic illnesses.  No recent hospital or ED visits.    Plan:  [facility] CM Care Plan Problem One     Most Recent Value  Care Plan Problem One  Activity intolerance related to anemia  Role Documenting the Problem One  Care Management Telephonic [place] for Problem One  Active  [facility] Long Term Goal   Patient will report continued ability to complete activities of daily living over the next 60 days.  [facility] Long Term Goal Start Date  11/25/19  Interventions for Problem One Long Term Goal  Patient continues to try to conserve energy by taking rest breaks.       RN CM will contact patient again in the month of April and patient agreeable.  [name], RN, MSN [place] Management Care Management Coordinator Direct Line [phone] Cell [phone] Toll Free: [phone]  Fax: [phone]

## 2019-11-25 NOTE — Progress Notes (Signed)
Called patient on 11/25/19 for a scheduled counseling session via phone. However, pt stated she could not talk at this time. Pt and Counseling Intern agreed to reschedule the session for Friday, November 25, 2019 at 12:15pm via phone.  Art Buff  Counseling Intern Voicemail:  [phone]

## 2019-11-28 NOTE — Progress Notes (Signed)
Spoke to patient on 11/28/19 for a scheduled telehealth counseling session via phone. Pt presented as overwhelmed and upset and stated she only had 10 minutes because her son was coming to take her out to lunch and run errands. Counseling intern suggested we just talk for a few minutes now to check-in and reschedule for a better time to have a more in depth conversation. Pt agreed. CI provided therapeutic listening, empathy, and normalization of feelings. Pt stated that she's had a hard week and had to "deal with a lot" and mentioned stress related to trouble with her bathroom renovation and a leak under her house. CI and pt discussed the difficulty of being patient when you don't have any control over a situation. Pt puts pressure on herself to rise to the occasion as evidenced by her statement "I've got to do the best that I can". Pt reported she tried to use positive thinking by reminding herself "It's going to get better". Because of the short amount of time CI will continue this conversation next week and plans on reminding pt of coping strategies (self-compassion and thought replacement) for times when pt feels overwhelmed and introduce radical acceptance to utilize when feeling a lack of control.  Art Buff [place] Counseling Intern Voicemail:  [phone]

## 2019-12-02 NOTE — Progress Notes (Signed)
Counseling intern spoke to patient on 12/02/19 for a telehealth counseling session via phone. Pt reported having a hard week because of aggravation about a bathroom renovation project which has not be completed. The pt reported feeling both frustrated and sad that the project has stalled. CI and pt discussed the strategies that the pt has used in the past including awareness of negative thoughts, thought restructuring, and noting where she has control. Pt identified her calming activities as calling friends and sewing. Pt and CI reflected about the physical improvements the client has made in the in the past six months.   Art Buff [place] Counseling Intern Voicemail:  [phone]

## 2019-12-03 ENCOUNTER — Ambulatory Visit (INDEPENDENT_AMBULATORY_CARE_PROVIDER_SITE_OTHER): Payer: Medicare HMO | Admitting: Pharmacist

## 2019-12-03 ENCOUNTER — Other Ambulatory Visit: Payer: Self-pay

## 2019-12-03 DIAGNOSIS — Z952 Presence of prosthetic heart valve: Secondary | ICD-10-CM | POA: Diagnosis not present

## 2019-12-03 DIAGNOSIS — Z7901 Long term (current) use of anticoagulants: Secondary | ICD-10-CM

## 2019-12-03 LAB — POCT INR: INR: 2 (ref 2.0–3.0)

## 2019-12-06 NOTE — Progress Notes (Signed)
[place]  [phone] [phone]    ID: NOVALEE HORSFALL DOB: 1941/03/27  MR#: [id number]  RCV#:[id number]  Patient Care Team: [name], MD as PCP - General (Internal Medicine) [name], MD as PCP - Advanced Heart Failure (Cardiology) Lorretta Harp, MD as PCP - Cardiology (Cardiology) Joelee Snoke, [name], MD as Consulting Physician (Hematology and Oncology) Arta Silence, MD as Consulting Physician (Gastroenterology) [name], MD as Consulting Physician (Cardiology) Marzetta Board, DPM as Consulting Physician (Podiatry) Gavin Pound, MD as Consulting Physician (Rheumatology) [name], [facility] as [facility] Management (Pharmacist) Jon Billings, RN as [place] Management OTHER MD:   CHIEF COMPLAINT: anemia and thrombocytopenia  CURRENT TREATMENT: [Feraheme], Retacrit, rituximab   INTERVAL HISTORY: Zorianna returns today for follow-up and treatment of her multifactorial anemia and vasculitis..  She continues on Retacrit for her anemia of renal insufficiency, currently at 40K units every 4 weeks so long as her hemoglobin is not over 11.  She last received this on 11/10/2019.  She also receives rituximab, every 4 weeks, with a dose due today.  She had a brief episode of leukopenia from this medication but that has resolved.  She tolerates the treatment with no side effects that she is aware of.  Her last blood transfusion was 05/27/2019. Her Hb has been safely over 10 Lab Results  Component Value Date   HGB 10.3 (L) 12/08/2019   HGB 10.3 (L) 11/10/2019   HGB 11.2 (L) 10/28/2019   HGB 11.3 (L) 10/13/2019   HGB 10.6 (L) 09/15/2019   Her ferritin remains >100 after her last feraheme dose, 05/12/2019: Lab Results  Component Value Date   FERRITIN 188 11/10/2019   FERRITIN 257 10/28/2019   FERRITIN 164 10/13/2019   FERRITIN 279 09/15/2019   FERRITIN 155 09/01/2019     We are also following her reticulocyte count: Results for TWYLLA, ARCENEAUX (MRN [id number]) as of 10/13/2019 10:35  Ref. Range 07/28/2019 08:46 08/04/2019 11:13 08/18/2019 10:42 09/01/2019 09:16 09/15/2019 10:23  Retic Count, Absolute Latest Ref Range: 19.0 - 186.0 K/uL 93.4 107.7 89.4 79.8 55.6   She is now off prednisone.   REVIEW OF SYSTEMS: Daveah has lost a significant amount of weight.  Some of that may have been water weight.  Her current weight is quite good for her.  She tells me she has an excellent appetite and a good sense of taste.  She is no longer using a wheelchair when she comes here or a walker at home.  She uses a cane.  She tells me her neighbors and family help her with groceries.  She received the first dose of her vaccine with the second dose due on 12/16/2019.  She has had no rash, no fever, no adenopathy.  There have been no falls.  She has slight diarrhea likely from the Metformin.  A detailed review of systems today was otherwise stable.   HISTORY OF CURRENT ILLNESS: From the original consult note:  RHYLEI MCQUAIG is an 79 y.o. female from [place], [place].  She has a past medical history significant forPAD s/p stenting; moderate to severe pulmonary HTN; afib and AVR on Coumadin; HTN; HLD; DM; and CAD s/p CABG. the patient was admitted to the hospital for anemia.  She was having increasing fatigue and shortness of breath and had routine lab work drawn which showed a hemoglobin of 6.6.  Due to her abnormal lab work, she was advised  to come to the emergency room for admission.  She was also admitted in February 2020 for symptomatic anemia.  She reports she has been anemic for at least a year and has been taking oral iron.  She underwent a colonoscopy and EGD on 01/22/2018.  The colonoscopy showed diverticulosis in the sigmoid colon and descending colon, internal hemorrhoids.  The upper endoscopy was normal except for erythematous mucosa in the antrum.  She had a  repeat EGD on 11/21/2018 that showed a few minimally oozing( with scope trauma) superficial gastric ulcers with pigmented material were found in the gastric body. The largest lesion was 4 mm in largest dimension.  She underwent another upper endoscopy on 02/27/2019 which showed patchy mildly friable mucosa with contact bleeding was found in the gastric fundus, in the gastric body and in the gastric antrum.  The patient is maintained on warfarin due to A. fib and a mechanical aortic valve.  Review of the patient's chart shows that she has been anemic dating back to at least December thousand 12 (these are the earliest labs available to me).  Her highest hemoglobin was 11.6 in December 2012.  I also note that the patient has had intermittent mild leukopenia adding back to at least April 2019.  Her lowest white blood cell count was 2.6 in April 2019.  Since admission, the patient has received 1 unit of packed red blood cells.  She is currently on heparin for her procedure with plan to bridge back to Coumadin.  Stool for occult blood was negative x1 on 02/24/2019.  Hematology was asked see the patient to make recommendations regarding her anemia.  [name] subsequent history is as detailed below.   PAST MEDICAL HISTORY: Past Medical History:  Diagnosis Date  . Aortic atherosclerosis ([place]) 01/23/2018  . Atrial fibrillation ([place])   . Carotid artery disease ([place])   . Cholelithiasis 01/23/2018  . Chronic anticoagulation   . Coronary artery disease    status post coronary artery bypass grafting times 07/10/2004  . Diabetes mellitus   . Hypercholesteremia   . Hypertension   . Mechanical heart valve present    H. aortic valve replacement at the time of bypass surgery October 2005  . Moderate to severe pulmonary hypertension ([place])   . Peripheral arterial disease ([facility])    history of left common iliac artery PTA and stenting for a chronic total occlusion 08/26/01     PAST SURGICAL HISTORY: Past  Surgical History:  Procedure Laterality Date  . AORTIC VALVE REPLACEMENT    . CARDIAC CATHETERIZATION  11/10/2004   40% right common illiac, 70% in stent restenosis of distal left common illiac,   . CARDIAC CATHETERIZATION  05/18/2004   LAD 50-70% midstenosis, RCA dominant w/50% stenosis, 50% Right common Illiac artery ostial stenosis, 90% in stent restenosis within midportion of left common illiac stent  . Carotid Duplex  03/12/2012   RSA-elev. velocities suggestive of a 50-69% diameter reduction, Right&Left Bulb/Prox ICA-mild-mod.fibrous plaqueelevating Velocities abnormal study.  . CHOLECYSTECTOMY N/A 03/01/2018   Procedure: LAPAROSCOPIC CHOLECYSTECTOMY;  Surgeon: Judeth Horn, MD;  Location: [facility];  Service: General;  Laterality: N/A;  . COLONOSCOPY WITH PROPOFOL N/A 01/22/2018   Procedure: COLONOSCOPY WITH PROPOFOL;  Surgeon: Wilford Corner, MD;  Location: [facility];  Service: Endoscopy;  Laterality: N/A;  . ESOPHAGOGASTRODUODENOSCOPY (EGD) WITH PROPOFOL N/A 01/22/2018   Procedure: ESOPHAGOGASTRODUODENOSCOPY (EGD) WITH PROPOFOL;  Surgeon: Wilford Corner, MD;  Location: [facility];  Service: Endoscopy;  Laterality: N/A;  . ESOPHAGOGASTRODUODENOSCOPY (EGD) WITH  PROPOFOL N/A 11/21/2018   Procedure: ESOPHAGOGASTRODUODENOSCOPY (EGD) WITH PROPOFOL;  Surgeon: [name], MD;  Location: [facility];  Service: Gastroenterology;  Laterality: N/A;  . ESOPHAGOGASTRODUODENOSCOPY (EGD) WITH PROPOFOL Left 02/27/2019   Procedure: ESOPHAGOGASTRODUODENOSCOPY (EGD) WITH PROPOFOL;  Surgeon: Arta Silence, MD;  [facility] END[facility]COPY;  Service: Endoscopy;  Laterality: Left;  . GIVENS CAPSULE STUDY N/A 11/21/2018   Procedure: GIVENS CAPSULE STUDY;  Surgeon: [name], MD;  Location: [facility];  Service: Gastroenterology;  Laterality: N/A;  To be deployed during EGD  . Lower Ext. Duplex  03/12/2012   Right Proximal CIA- vessel narrowing w/elevated velocities 0-49% diameter reduction. Right SFA-mild  mixed density plaque throughout vessel.  [name] NM MYOCAR PERF WALL MOTION  05/19/2010   protocol: Persantine, post stress EF 65%, negative for ischemia, low risk scan  . RIGHT HEART CATH N/A 06/27/2018   Procedure: RIGHT HEART CATH;  Surgeon: [name], MD;  Location: [facility];  Service: Cardiovascular;  Laterality: N/A;  . TRANSTHORACIC ECHOCARDIOGRAM  08/29/2012   Moderately calcified annulus of mitral valve, moderate regurg. of both mitral valve and tricuspid valve.     FAMILY HISTORY: Family History  Problem Relation Age of Onset  . Breast cancer Neg Hx     GYNECOLOGIC HISTORY:  No LMP recorded. Patient has had a hysterectomy. Menarche:  years old Age at first live birth:  [place] P: 3 LMP:  Contraceptive:  HRT:   Hysterectomy?: yes BSO?:    SOCIAL HISTORY: (Current as of 03/07/2019) Devora is widowed.  She lives by herself.  She has 3 children that live locally.  She smoked three quarters of a pack of cigarettes daily for 50 years.  She quit in 2005.  Denies alcohol use.   ADVANCED DIRECTIVES: Not in place    HEALTH MAINTENANCE: Social History   Tobacco Use  . Smoking status: Former Smoker    Packs/day: 1.00    Years: 30.00    Pack years: 30.00    Types: Cigarettes  . Smokeless tobacco: Never Used  . Tobacco comment: quit smoking 2005  Substance Use Topics  . Alcohol use: No    Alcohol/week: 0.0 standard drinks  . Drug use: No    Colonoscopy: EGD on 02/27/2019 under Dr. Paulita Fujita found some friable gastric mucosa.  Colonoscopy under Dr. Michail Sermon 01/22/2018 showed significant diverticular disease  PAP:   Bone density: May 2017 showed osteopenia Mammography: 02/25/2017, breast density category B.  Allergies  Allergen Reactions  . Flagyl [Metronidazole] Rash    ALL-OVER BODY RASH  . Coreg [Carvedilol] Other (See Comments)    Terrible cramping in the feet and had a lot of bowel movements, but not diarrhea  . Losartan Swelling    Patient doesn't recall  site of swelling  . Verapamil Hives  . Zetia [Ezetimibe] Other (See Comments)    Reaction not recalled  . Zocor [Simvastatin - High Dose] Other (See Comments)    Reaction not recalled    Current Outpatient Medications  Medication Sig Dispense Refill  . acetaminophen (TYLENOL) 325 MG tablet Take 650 mg by mouth every 6 (six) hours as needed for mild pain.     [name] allopurinol (ZYLOPRIM) 100 MG tablet Take 100 mg by mouth daily.    [name] ALPRAZolam (XANAX) 0.5 MG tablet Take 0.25 mg by mouth at bedtime.     [name] amLODipine (NORVASC) 10 MG tablet Take 1 tablet (10 mg total) by mouth daily. 90 tablet 3  . atorvastatin (LIPITOR) 80 MG tablet  Take 1 tablet (80 mg total) by mouth daily at 6 PM.    . Cholecalciferol (VITAMIN D3) 1000 UNITS CAPS Take 2,000 Units by mouth daily with lunch.     . cloNIDine (CATAPRES) 0.2 MG tablet Take 1 tablet (0.2 mg total) by mouth 2 (two) times daily. 60 tablet 3  . Cyanocobalamin (VITAMIN B12) 1000 MCG TBCR Take 1,000 mcg by mouth daily with lunch.     . furosemide (LASIX) 40 MG tablet Take 40 mg by mouth 2 (two) times daily.    . hydrALAZINE (APRESOLINE) 100 MG tablet Take 1 tablet (100 mg total) by mouth 3 (three) times daily. 270 tablet 1  . lactose free nutrition (BOOST) LIQD Take 237 mLs by mouth daily. (To supplement meals)    . magnesium oxide (MAG-OX) 400 (241.3 Mg) MG tablet Take 1 tablet (400 mg total) by mouth daily.    . metFORMIN (GLUCOPHAGE) 500 MG tablet Take 500 mg by mouth 2 (two) times daily with a meal.     . metoprolol tartrate (LOPRESSOR) 25 MG tablet Take 0.5 tablets (12.5 mg total) by mouth daily. 45 tablet 3  . pantoprazole (PROTONIX) 40 MG tablet Take 40 mg by mouth 2 (two) times daily.     [name] spironolactone (ALDACTONE) 25 MG tablet Take 1 tablet (25 mg total) by mouth daily. 90 tablet 3  . tadalafil, PAH, (ADCIRCA) 20 MG tablet Take 2 tablets (40 mg total) by mouth daily. 60 tablet 6  . vitamin C (ASCORBIC ACID) 500 MG tablet Take 500 mg by mouth  daily with lunch.    . warfarin (COUMADIN) 5 MG tablet TAKE 1.5 TO 2 TABLETS BY MOUTH DAILY AS DIRECTED BY COUMADIN CLINIC 180 tablet 0   No current facility-administered medications for this visit.    OBJECTIVE: older white woman using a cane Vitals:   12/08/19 1101  BP: (!) 131/58  Pulse: (!) 51  Resp: 16  Temp: 98.5 F (36.9 C)  SpO2: 98%   Wt Readings from Last 3 Encounters:  12/08/19 121 lb 4.8 oz (55 kg)  10/21/19 120 lb 3.2 oz (54.5 kg)  10/13/19 120 lb 11.2 oz (54.7 kg)   Body mass index is 22.19 kg/m.    ECOG FS:2 - Symptomatic, <50% confined to bed  Sclerae unicteric, EOMs intact Wearing a mask No cervical or supraclavicular adenopathy Lungs no rales or rhonchi Heart regular rate and rhythm Abd soft, nontender, positive bowel sounds MSK no focal spinal tenderness, no upper extremity lymphedema Neuro: nonfocal, well oriented, positive affect Breasts: Deferred   LAB RESULTS:  CMP     Component Value Date/Time   NA 135 11/10/2019 1126   NA 137 12/06/2017 1436   K 4.0 11/10/2019 1126   CL 102 11/10/2019 1126   CO2 22 11/10/2019 1126   GLUCOSE 142 (H) 11/10/2019 1126   BUN 81 (H) 11/10/2019 1126   BUN 20 12/06/2017 1436   CREATININE 1.79 (H) 11/10/2019 1126   CALCIUM 9.3 11/10/2019 1126   PROT 6.6 11/10/2019 1126   ALBUMIN 3.8 11/10/2019 1126   AST 26 11/10/2019 1126   ALT 18 11/10/2019 1126   ALKPHOS 74 11/10/2019 1126   BILITOT 0.4 11/10/2019 1126   GFRNONAA 26 (L) 11/10/2019 1126   GFRAA 30 (L) 11/10/2019 1126    Lab Results  Component Value Date   TOTALPROTELP 6.9 05/07/2019    No results found for: KPAFRELGTCHN, LAMBDASER, KAPLAMBRATIO  Lab Results  Component Value Date   WBC 5.7 12/08/2019  NEUTROABS 3.8 12/08/2019   HGB 10.3 (L) 12/08/2019   HCT 30.9 (L) 12/08/2019   MCV 87.5 12/08/2019   PLT 157 12/08/2019    No results found for: LABCA2  No components found for: [id number]  Recent Labs  Lab 12/03/19 1051  INR 2.0     No results found for: LABCA2  No results found for: RSW546  No results found for: EVO350  No results found for: KXF818  No results found for: CA2729  No components found for: HGQUANT  No results found for: CEA1 / No results found for: CEA1   No results found for: AFPTUMOR  No results found for: CHROMOGRNA  No results found for: HGBA, HGBA2QUANT, HGBFQUANT, HGBSQUAN (Hemoglobinopathy evaluation)   Lab Results  Component Value Date   LDH 212 (H) 08/04/2019    Lab Results  Component Value Date   IRON 110 11/10/2019   TIBC 256 11/10/2019   IRONPCTSAT 43 11/10/2019   (Iron and TIBC)  Lab Results  Component Value Date   FERRITIN 188 11/10/2019    Urinalysis    Component Value Date/Time   COLORURINE YELLOW 07/07/2019 0209   APPEARANCEUR CLOUDY (A) 07/07/2019 0209   LABSPEC 1.011 07/07/2019 0209   PHURINE 6.0 07/07/2019 0209   GLUCOSEU NEGATIVE 07/07/2019 0209   HGBUR NEGATIVE 07/07/2019 0209   BILIRUBINUR NEGATIVE 07/07/2019 0209   KETONESUR NEGATIVE 07/07/2019 0209   PROTEINUR >=300 (A) 07/07/2019 0209   UROBILINOGEN 1.0 10/06/2011 1709   NITRITE NEGATIVE 07/07/2019 0209   LEUKOCYTESUR LARGE (A) 07/07/2019 0209    STUDIES:  No results found.   ELIGIBLE FOR AVAILABLE RESEARCH PROTOCOL: no   ASSESSMENT: 79 y.o. [place] woman with multifactorial anemia, as follows:             (a) anemia of chronic inflammation: As of May 2020 she has a SED rate of 95, CRP 1.1, positive ANA and RF; subsequently she was found to be ANCA positive             (b) hemolysis (mild): she has a mildly elevated LDH and a DAT positive for complement, not IgG; this is c/w (a) above             (c) anemia of renal insufficiency: inadequate EPO resonse to anemia              (d) GIB/ iron deficiency in patient on lifelong anticoagulaton  (1) Feraheme received 03/01/2019, repeated 03/20/2019   (a) reticulocyte 03/07/2019 up from 43.4 a year ago to 191.8 after iron  infusion  (b) feraheme repeated on 05/12/2019  (c) subsequent ferritin levels >100  (2) darbepoetin: starting 05/05/2019 at 142mcg given every 2 weeks  (a) dose increased to 200 mcg every week starting 07/01/2019   (b) back to every 2 weeks beginning 08/04/2019 still at 200 mcg dose  (3) ANCA positive vasculitis: diagnosed 05/08/2019 (titer 1:640)  (a) prednisone 40 mg daily started 05/31/2019  (b) rituximab weekly started 06/12/2019 (received test dose 06/05/2019)   (i) hepatitis B sAg, core Ab and HIV negative 05/08/2019   (ii) rituximab held after 07/14/2019 dose (received 5 weekly doses) with neutropenia   (iii) rituximab resumed 08/18/2019 but changed to monthly  (c) prednisone tapered to off as of 09/15/2019  (4) osteopenia with a T score of -1.5 on bone density 02/28/2016 at the breast center    PLAN: Fatiha is tolerating her rituximab and Retacrit without any side effects that she is aware of.  She has a significantly improved  functional status.  Today she is ambulating with a cane, carrying her purse and also a fairly large bag that she brings when she gets there is right toxin including food, a pillow, and other things to keep her self busy.  She did use a cane to get in here or to walk to the treatment area.  She tells me she is having some renovations done at home and she is helping move some of the little things she can carry.  She will receive her second dose of the COVID-19 vaccine on 12/16/2019.  She understands that she is very unlikely to make good antibodies because of her disease and its treatment.  She will need to continue to protect herself until there is herd immunity which is likely not going to be the case until the fall.  Nevertheless it is a good thing that she receives the vaccine as she may get some protection from it.  She will return in 4 weeks for the same treatment as today, but with no visit, and then see me again 8 weeks from now.  I would like to start  stretching out the treatment to every 6weeks but with her borderline hemoglobin I think we should continue what we are doing a bit longer.  She knows to call for any other issue that may develop before the next visit.  Total encounter time 35 minutes.Sarajane Jews C. Camil Wilhelmsen, MD  12/08/19 11:13 AM Medical Oncology and Hematology [facility] [place], [place] [address] Tel. [phone]    Fax. [phone]   I, Wilburn Mylar, am acting as scribe for Dr. [name]. Jameel Quant.  I, Lurline Del MD, have reviewed the above documentation for accuracy and completeness, and I agree with the above.   *Total Encounter Time as defined by the Centers for Medicare and Medicaid Services includes, in addition to the face-to-face time of a patient visit (documented in the note above) non-face-to-face time: obtaining and reviewing outside history, ordering and reviewing medications, tests or procedures, care coordination (communications with other health care professionals or caregivers) and documentation in the medical record.

## 2019-12-08 ENCOUNTER — Inpatient Hospital Stay: Payer: Medicare HMO

## 2019-12-08 ENCOUNTER — Other Ambulatory Visit: Payer: Self-pay

## 2019-12-08 ENCOUNTER — Inpatient Hospital Stay: Payer: Medicare HMO | Attending: Oncology

## 2019-12-08 ENCOUNTER — Inpatient Hospital Stay (HOSPITAL_BASED_OUTPATIENT_CLINIC_OR_DEPARTMENT_OTHER): Payer: Medicare HMO | Admitting: Oncology

## 2019-12-08 VITALS — BP 131/58 | HR 51 | Temp 98.5°F | Resp 16 | Ht 62.0 in | Wt 121.3 lb

## 2019-12-08 VITALS — BP 148/36 | HR 57 | Temp 97.6°F | Resp 16

## 2019-12-08 DIAGNOSIS — N184 Chronic kidney disease, stage 4 (severe): Secondary | ICD-10-CM

## 2019-12-08 DIAGNOSIS — I776 Arteritis, unspecified: Secondary | ICD-10-CM

## 2019-12-08 DIAGNOSIS — Z952 Presence of prosthetic heart valve: Secondary | ICD-10-CM

## 2019-12-08 DIAGNOSIS — D500 Iron deficiency anemia secondary to blood loss (chronic): Secondary | ICD-10-CM

## 2019-12-08 DIAGNOSIS — Z5112 Encounter for antineoplastic immunotherapy: Secondary | ICD-10-CM | POA: Insufficient documentation

## 2019-12-08 DIAGNOSIS — N189 Chronic kidney disease, unspecified: Secondary | ICD-10-CM | POA: Diagnosis not present

## 2019-12-08 DIAGNOSIS — M8580 Other specified disorders of bone density and structure, unspecified site: Secondary | ICD-10-CM | POA: Insufficient documentation

## 2019-12-08 DIAGNOSIS — D631 Anemia in chronic kidney disease: Secondary | ICD-10-CM | POA: Diagnosis not present

## 2019-12-08 DIAGNOSIS — D508 Other iron deficiency anemias: Secondary | ICD-10-CM

## 2019-12-08 DIAGNOSIS — R7402 Elevation of levels of lactic acid dehydrogenase (LDH): Secondary | ICD-10-CM | POA: Insufficient documentation

## 2019-12-08 DIAGNOSIS — D61818 Other pancytopenia: Secondary | ICD-10-CM

## 2019-12-08 DIAGNOSIS — I7782 Antineutrophilic cytoplasmic antibody (ANCA) vasculitis: Secondary | ICD-10-CM

## 2019-12-08 DIAGNOSIS — N289 Disorder of kidney and ureter, unspecified: Secondary | ICD-10-CM

## 2019-12-08 DIAGNOSIS — D696 Thrombocytopenia, unspecified: Secondary | ICD-10-CM

## 2019-12-08 DIAGNOSIS — D594 Other nonautoimmune hemolytic anemias: Secondary | ICD-10-CM

## 2019-12-08 DIAGNOSIS — I739 Peripheral vascular disease, unspecified: Secondary | ICD-10-CM

## 2019-12-08 LAB — CMP (CANCER CENTER ONLY)
ALT: 16 U/L (ref 0–44)
AST: 24 U/L (ref 15–41)
Albumin: 3.6 g/dL (ref 3.5–5.0)
Alkaline Phosphatase: 77 U/L (ref 38–126)
Anion gap: 10 (ref 5–15)
BUN: 80 mg/dL — ABNORMAL HIGH (ref 8–23)
CO2: 22 mmol/L (ref 22–32)
Calcium: 9.4 mg/dL (ref 8.9–10.3)
Chloride: 104 mmol/L (ref 98–111)
Creatinine: 1.78 mg/dL — ABNORMAL HIGH (ref 0.44–1.00)
GFR, Est AFR Am: 31 mL/min — ABNORMAL LOW (ref >60)
GFR, Estimated: 26 mL/min — ABNORMAL LOW (ref >60)
Glucose, Bld: 160 mg/dL — ABNORMAL HIGH (ref 70–99)
Potassium: 4 mmol/L (ref 3.5–5.1)
Sodium: 136 mmol/L (ref 135–145)
Total Bilirubin: 0.4 mg/dL (ref 0.3–1.2)
Total Protein: 6.5 g/dL (ref 6.5–8.1)

## 2019-12-08 LAB — CBC WITH DIFFERENTIAL/PLATELET
Abs Immature Granulocytes: 0.03 10*3/uL (ref 0.00–0.07)
Basophils Absolute: 0 10*3/uL (ref 0.0–0.1)
Basophils Relative: 1 %
Eosinophils Absolute: 0.1 10*3/uL (ref 0.0–0.5)
Eosinophils Relative: 2 %
HCT: 30.9 % — ABNORMAL LOW (ref 36.0–46.0)
Hemoglobin: 10.3 g/dL — ABNORMAL LOW (ref 12.0–15.0)
Immature Granulocytes: 1 %
Lymphocytes Relative: 18 %
Lymphs Abs: 1 10*3/uL (ref 0.7–4.0)
MCH: 29.2 pg (ref 26.0–34.0)
MCHC: 33.3 g/dL (ref 30.0–36.0)
MCV: 87.5 fL (ref 80.0–100.0)
Monocytes Absolute: 0.6 10*3/uL (ref 0.1–1.0)
Monocytes Relative: 11 %
Neutro Abs: 3.8 10*3/uL (ref 1.7–7.7)
Neutrophils Relative %: 67 %
Platelets: 157 10*3/uL (ref 150–400)
RBC: 3.53 MIL/uL — ABNORMAL LOW (ref 3.87–5.11)
RDW: 18.5 % — ABNORMAL HIGH (ref 11.5–15.5)
WBC: 5.7 10*3/uL (ref 4.0–10.5)
nRBC: 0 % (ref 0.0–0.2)

## 2019-12-08 LAB — IRON AND TIBC
Iron: 128 ug/dL (ref 41–142)
Saturation Ratios: 49 % (ref 21–57)
TIBC: 259 ug/dL (ref 236–444)
UIBC: 131 ug/dL (ref 120–384)

## 2019-12-08 LAB — RETICULOCYTES
Immature Retic Fract: 5.2 % (ref 2.3–15.9)
RBC.: 3.57 MIL/uL — ABNORMAL LOW (ref 3.87–5.11)
Retic Count, Absolute: 38.9 10*3/uL (ref 19.0–186.0)
Retic Ct Pct: 1.1 % (ref 0.4–3.1)

## 2019-12-08 LAB — FERRITIN: Ferritin: 196 ng/mL (ref 11–307)

## 2019-12-08 LAB — SAVE SMEAR(SSMR), FOR PROVIDER SLIDE REVIEW

## 2019-12-08 MED ORDER — DIPHENHYDRAMINE HCL 25 MG PO CAPS
ORAL_CAPSULE | ORAL | Status: AC
  Filled 2019-12-08: qty 1

## 2019-12-08 MED ORDER — SODIUM CHLORIDE 0.9 % IV SOLN
Freq: Once | INTRAVENOUS | Status: AC
  Filled 2019-12-08: qty 250

## 2019-12-08 MED ORDER — SODIUM CHLORIDE 0.9 % IV SOLN
375.0000 mg/m2 | Freq: Once | INTRAVENOUS | Status: AC
  Administered 2019-12-08: 13:00:00 600 mg via INTRAVENOUS
  Filled 2019-12-08: qty 10

## 2019-12-08 MED ORDER — ACETAMINOPHEN 325 MG PO TABS
ORAL_TABLET | ORAL | Status: AC
  Filled 2019-12-08: qty 2

## 2019-12-08 MED ORDER — DIPHENHYDRAMINE HCL 25 MG PO CAPS
25.0000 mg | ORAL_CAPSULE | Freq: Once | ORAL | Status: AC
  Administered 2019-12-08: 25 mg via ORAL

## 2019-12-08 MED ORDER — EPOETIN ALFA-EPBX 40000 UNIT/ML IJ SOLN
40000.0000 [IU] | Freq: Once | INTRAMUSCULAR | Status: AC
  Administered 2019-12-08: 40000 [IU] via SUBCUTANEOUS

## 2019-12-08 MED ORDER — EPOETIN ALFA-EPBX 40000 UNIT/ML IJ SOLN
INTRAMUSCULAR | Status: AC
  Filled 2019-12-08: qty 1

## 2019-12-08 MED ORDER — ACETAMINOPHEN 325 MG PO TABS
650.0000 mg | ORAL_TABLET | Freq: Once | ORAL | Status: AC
  Administered 2019-12-08: 650 mg via ORAL

## 2019-12-08 NOTE — Progress Notes (Signed)
Okay to treat with creatinine level per Dr. Jana Hakim

## 2019-12-08 NOTE — Patient Instructions (Signed)
Epoetin Alfa injection What is this medicine? EPOETIN ALFA (e POE e tin AL fa) helps your body make more red blood cells. This medicine is used to treat anemia caused by chronic kidney disease, cancer chemotherapy, or HIV-therapy. It may also be used before surgery if you have anemia. This medicine may be used for other purposes; ask your health care provider or pharmacist if you have questions. COMMON BRAND NAME(S): Epogen, Procrit, Retacrit What should I tell my health care provider before I take this medicine? They need to know if you have any of these conditions:  cancer  heart disease  high blood pressure  history of blood clots  history of stroke  low levels of folate, iron, or vitamin B12 in the blood  seizures  an unusual or allergic reaction to erythropoietin, albumin, benzyl alcohol, hamster proteins, other medicines, foods, dyes, or preservatives  pregnant or trying to get pregnant  breast-feeding How should I use this medicine? This medicine is for injection into a vein or under the skin. It is usually given by a health care professional in a hospital or clinic setting. If you get this medicine at home, you will be taught how to prepare and give this medicine. Use exactly as directed. Take your medicine at regular intervals. Do not take your medicine more often than directed. It is important that you put your used needles and syringes in a special sharps container. Do not put them in a trash can. If you do not have a sharps container, call your pharmacist or healthcare provider to get one. A special MedGuide will be given to you by the pharmacist with each prescription and refill. Be sure to read this information carefully each time. Talk to your pediatrician regarding the use of this medicine in children. While this drug may be prescribed for selected conditions, precautions do apply. Overdosage: If you think you have taken too much of this medicine contact a poison  control center or emergency room at once. NOTE: This medicine is only for you. Do not share this medicine with others. What if I miss a dose? If you miss a dose, take it as soon as you can. If it is almost time for your next dose, take only that dose. Do not take double or extra doses. What may interact with this medicine? Interactions have not been studied. This list may not describe all possible interactions. Give your health care provider a list of all the medicines, herbs, non-prescription drugs, or dietary supplements you use. Also tell them if you smoke, drink alcohol, or use illegal drugs. Some items may interact with your medicine. What should I watch for while using this medicine? Your condition will be monitored carefully while you are receiving this medicine. You may need blood work done while you are taking this medicine. This medicine may cause a decrease in vitamin B6. You should make sure that you get enough vitamin B6 while you are taking this medicine. Discuss the foods you eat and the vitamins you take with your health care professional. What side effects may I notice from receiving this medicine? Side effects that you should report to your doctor or health care professional as soon as possible:  allergic reactions like skin rash, itching or hives, swelling of the face, lips, or tongue  seizures  signs and symptoms of a blood clot such as breathing problems; changes in vision; chest pain; severe, sudden headache; pain, swelling, warmth in the leg; trouble speaking; sudden numbness or   weakness of the face, arm or leg  signs and symptoms of a stroke like changes in vision; confusion; trouble speaking or understanding; severe headaches; sudden numbness or weakness of the face, arm or leg; trouble walking; dizziness; loss of balance or coordination Side effects that usually do not require medical attention (report to your doctor or health care professional if they continue or are  bothersome):  chills  cough  dizziness  fever  headaches  joint pain  muscle cramps  muscle pain  nausea, vomiting  pain, redness, or irritation at site where injected This list may not describe all possible side effects. Call your doctor for medical advice about side effects. You may report side effects to FDA at 1-800-FDA-1088. Where should I keep my medicine? Keep out of the reach of children. Store in a refrigerator between 2 and 8 degrees C (36 and 46 degrees F). Do not freeze or shake. Throw away any unused portion if using a single-dose vial. Multi-dose vials can be kept in the refrigerator for up to 21 days after the initial dose. Throw away unused medicine. NOTE: This sheet is a summary. It may not cover all possible information. If you have questions about this medicine, talk to your doctor, pharmacist, or health care provider.  2020 Elsevier/Gold Standard (2017-05-04 08:35:19) Rituximab injection What is this medicine? RITUXIMAB (ri TUX i mab) is a monoclonal antibody. It is used to treat certain types of cancer like non-Hodgkin lymphoma and chronic lymphocytic leukemia. It is also used to treat rheumatoid arthritis, granulomatosis with polyangiitis (or Wegener's granulomatosis), microscopic polyangiitis, and pemphigus vulgaris. This medicine may be used for other purposes; ask your health care provider or pharmacist if you have questions. COMMON BRAND NAME(S): Rituxan, RUXIENCE What should I tell my health care provider before I take this medicine? They need to know if you have any of these conditions:  heart disease  infection (especially a virus infection such as hepatitis B, chickenpox, cold sores, or herpes)  immune system problems  irregular heartbeat  kidney disease  low blood counts, like low white cell, platelet, or red cell counts  lung or breathing disease, like asthma  recently received or scheduled to receive a vaccine  an unusual or  allergic reaction to rituximab, other medicines, foods, dyes, or preservatives  pregnant or trying to get pregnant  breast-feeding How should I use this medicine? This medicine is for infusion into a vein. It is administered in a hospital or clinic by a specially trained health care professional. A special MedGuide will be given to you by the pharmacist with each prescription and refill. Be sure to read this information carefully each time. Talk to your pediatrician regarding the use of this medicine in children. This medicine is not approved for use in children. Overdosage: If you think you have taken too much of this medicine contact a poison control center or emergency room at once. NOTE: This medicine is only for you. Do not share this medicine with others. What if I miss a dose? It is important not to miss a dose. Call your doctor or health care professional if you are unable to keep an appointment. What may interact with this medicine?  cisplatin  live virus vaccines This list may not describe all possible interactions. Give your health care provider a list of all the medicines, herbs, non-prescription drugs, or dietary supplements you use. Also tell them if you smoke, drink alcohol, or use illegal drugs. Some items may interact with your  medicine. What should I watch for while using this medicine? Your condition will be monitored carefully while you are receiving this medicine. You may need blood work done while you are taking this medicine. This medicine can cause serious allergic reactions. To reduce your risk you may need to take medicine before treatment with this medicine. Take your medicine as directed. In some patients, this medicine may cause a serious brain infection that may cause death. If you have any problems seeing, thinking, speaking, walking, or standing, tell your healthcare professional right away. If you cannot reach your healthcare professional, urgently seek other  source of medical care. Call your doctor or health care professional for advice if you get a fever, chills or sore throat, or other symptoms of a cold or flu. Do not treat yourself. This drug decreases your body's ability to fight infections. Try to avoid being around people who are sick. Do not become pregnant while taking this medicine or for at least 12 months after stopping it. Women should inform their doctor if they wish to become pregnant or think they might be pregnant. There is a potential for serious side effects to an unborn child. Talk to your health care professional or pharmacist for more information. Do not breast-feed an infant while taking this medicine or for at least 6 months after stopping it. What side effects may I notice from receiving this medicine? Side effects that you should report to your doctor or health care professional as soon as possible:  allergic reactions like skin rash, itching or hives; swelling of the face, lips, or tongue  breathing problems  chest pain  changes in vision  diarrhea  headache with fever, neck stiffness, sensitivity to light, nausea, or confusion  fast, irregular heartbeat  loss of memory  low blood counts - this medicine may decrease the number of white blood cells, red blood cells and platelets. You may be at increased risk for infections and bleeding.  mouth sores  problems with balance, talking, or walking  redness, blistering, peeling or loosening of the skin, including inside the mouth  signs of infection - fever or chills, cough, sore throat, pain or difficulty passing urine  signs and symptoms of kidney injury like trouble passing urine or change in the amount of urine  signs and symptoms of liver injury like dark yellow or brown urine; general ill feeling or flu-like symptoms; light-colored stools; loss of appetite; nausea; right upper belly pain; unusually weak or tired; yellowing of the eyes or skin  signs and  symptoms of low blood pressure like dizziness; feeling faint or lightheaded, falls; unusually weak or tired  stomach pain  swelling of the ankles, feet, hands  unusual bleeding or bruising  vomiting Side effects that usually do not require medical attention (report to your doctor or health care professional if they continue or are bothersome):  headache  joint pain  muscle cramps or muscle pain  nausea  tiredness This list may not describe all possible side effects. Call your doctor for medical advice about side effects. You may report side effects to FDA at 1-800-FDA-1088. Where should I keep my medicine? This drug is given in a hospital or clinic and will not be stored at home. NOTE: This sheet is a summary. It may not cover all possible information. If you have questions about this medicine, talk to your doctor, pharmacist, or health care provider.  2020 Elsevier/Gold Standard (2018-11-06 22:01:36)

## 2019-12-09 ENCOUNTER — Telehealth: Payer: Self-pay | Admitting: Oncology

## 2019-12-09 NOTE — Telephone Encounter (Signed)
I talk with patient regarding schedule she said she like afternoon

## 2019-12-09 NOTE — Progress Notes (Signed)
Spoke to pt on 2021 for a telehealth counseling session via phone. Counseling intern provided space for pt to open up about experiences and responded with empathy, compassion, and normalization of feelings. Pt reported having a "busy day" today. Pt reported continued "aggrivation" about her bathroom renovation project which has dragged on much longer than she anticipated. Pt reflected about how hard it is to have someone in her home on days when she isn't feeling well. Pt reported she received a good report from her oncologist this week and reflected that she "has come a long way" in the past 10 months. CI and pt discussed how she would not have been able too take on this home renovatioon project just 6 months ago. Pt stated she plans to get her second dose of the Covid-19 vaccination next Tuesday. Pt's son came to take her to out to run errands so we had to cut our session short. Pt requested to schedule for later the following week.  Next Counseling Session: Thursday, December 18, 2019 at 10:00am via phone  Art Buff [facility] Counseling Intern Voicemail:  [phone]

## 2019-12-12 ENCOUNTER — Ambulatory Visit: Payer: Medicare HMO | Admitting: Pharmacist

## 2019-12-18 DIAGNOSIS — D61818 Other pancytopenia: Secondary | ICD-10-CM | POA: Diagnosis not present

## 2019-12-18 DIAGNOSIS — I130 Hypertensive heart and chronic kidney disease with heart failure and stage 1 through stage 4 chronic kidney disease, or unspecified chronic kidney disease: Secondary | ICD-10-CM | POA: Diagnosis not present

## 2019-12-18 DIAGNOSIS — N1831 Chronic kidney disease, stage 3a: Secondary | ICD-10-CM | POA: Diagnosis not present

## 2019-12-18 DIAGNOSIS — E1122 Type 2 diabetes mellitus with diabetic chronic kidney disease: Secondary | ICD-10-CM | POA: Diagnosis not present

## 2019-12-18 DIAGNOSIS — I5032 Chronic diastolic (congestive) heart failure: Secondary | ICD-10-CM | POA: Diagnosis not present

## 2019-12-18 DIAGNOSIS — I272 Pulmonary hypertension, unspecified: Secondary | ICD-10-CM | POA: Diagnosis not present

## 2019-12-18 DIAGNOSIS — G8929 Other chronic pain: Secondary | ICD-10-CM | POA: Diagnosis not present

## 2019-12-18 DIAGNOSIS — E1151 Type 2 diabetes mellitus with diabetic peripheral angiopathy without gangrene: Secondary | ICD-10-CM | POA: Diagnosis not present

## 2019-12-18 DIAGNOSIS — I776 Arteritis, unspecified: Secondary | ICD-10-CM | POA: Diagnosis not present

## 2019-12-18 DIAGNOSIS — E1159 Type 2 diabetes mellitus with other circulatory complications: Secondary | ICD-10-CM | POA: Diagnosis not present

## 2019-12-18 DIAGNOSIS — I482 Chronic atrial fibrillation, unspecified: Secondary | ICD-10-CM | POA: Diagnosis not present

## 2019-12-25 ENCOUNTER — Telehealth (INDEPENDENT_AMBULATORY_CARE_PROVIDER_SITE_OTHER): Payer: Medicare HMO | Admitting: Cardiovascular Disease

## 2019-12-25 ENCOUNTER — Encounter: Payer: Self-pay | Admitting: Cardiovascular Disease

## 2019-12-25 VITALS — BP 163/89 | HR 55 | Ht 62.0 in | Wt 121.0 lb

## 2019-12-25 DIAGNOSIS — E7800 Pure hypercholesterolemia, unspecified: Secondary | ICD-10-CM | POA: Diagnosis not present

## 2019-12-25 DIAGNOSIS — I2510 Atherosclerotic heart disease of native coronary artery without angina pectoris: Secondary | ICD-10-CM

## 2019-12-25 DIAGNOSIS — I779 Disorder of arteries and arterioles, unspecified: Secondary | ICD-10-CM

## 2019-12-25 DIAGNOSIS — I272 Pulmonary hypertension, unspecified: Secondary | ICD-10-CM | POA: Diagnosis not present

## 2019-12-25 DIAGNOSIS — I10 Essential (primary) hypertension: Secondary | ICD-10-CM

## 2019-12-25 DIAGNOSIS — Z952 Presence of prosthetic heart valve: Secondary | ICD-10-CM | POA: Diagnosis not present

## 2019-12-25 DIAGNOSIS — Z7901 Long term (current) use of anticoagulants: Secondary | ICD-10-CM

## 2019-12-25 DIAGNOSIS — I480 Paroxysmal atrial fibrillation: Secondary | ICD-10-CM

## 2019-12-25 DIAGNOSIS — I5033 Acute on chronic diastolic (congestive) heart failure: Secondary | ICD-10-CM

## 2019-12-25 DIAGNOSIS — I739 Peripheral vascular disease, unspecified: Secondary | ICD-10-CM

## 2019-12-25 DIAGNOSIS — I6523 Occlusion and stenosis of bilateral carotid arteries: Secondary | ICD-10-CM | POA: Diagnosis not present

## 2019-12-25 NOTE — Patient Instructions (Signed)
Medication Instructions:  Continue current medications  *If you need a refill on your cardiac medications before your next appointment, please call your pharmacy*   Lab Work: None Ordered  Testing/Procedures: Your physician has requested that you have a carotid duplex. This test is an ultrasound of the carotid arteries in your neck. It looks at blood flow through these arteries that supply the brain with blood. Allow one hour for this exam. There are no restrictions or special instructions.  Your physician has requested that you have an echocardiogram. Echocardiography is a painless test that uses sound waves to create images of your heart. It provides your doctor with information about the size and shape of your heart and how well your heart's chambers and valves are working. This procedure takes approximately one hour. There are no restrictions for this procedure.   Follow-Up: At [facility], you and your health needs are our priority.  As part of our continuing mission to provide you with exceptional heart care, we have created designated Provider Care Teams.  These Care Teams include your primary Cardiologist (physician) and Advanced Practice Providers (APPs -  Physician Assistants and Nurse Practitioners) who all work together to provide you with the care you need, when you need it.  We recommend signing up for the patient portal called "MyChart".  Sign up information is provided on this After Visit Summary.  MyChart is used to connect with patients for Virtual Visits (Telemedicine).  Patients are able to view lab/test results, encounter notes, upcoming appointments, etc.  Non-urgent messages can be sent to your provider as well.   To learn more about what you can do with MyChart, go to [email or web address].    Your next appointment:   1 year(s)  The format for your next appointment:   In Person  Provider:   You may see Quay Burow, MD or one of the following Advanced  Practice Providers on your designated Care Team:    Kerin Ransom, PA-C  [place], [place]  Coletta Memos, [place]

## 2019-12-25 NOTE — Progress Notes (Signed)
Counseling intern spoke to patient on 3/18/ 2021 for a telehealth counseling session via phone. Counseling intern provided space for pt to open up about experiences and responded with empathy, compassion, and normalization of feelings. Pt presented in a distressed mood.   Pt reported having a difficult week as evidenced by her statement that "everytime we talk something is wrong". Pt reported she has significant swelling and pain in her hands since Monday, which is impacting her daily functioning. Pt stated she has called her PCP and hopes to have a prescription called in today. Pt and CI discussed her belief that "I ask too much". Pt expressed feeling bad that she has to rely on her children and her neighbor, but agreed that she has many needs that require support. She verbalized that she knows that her children believe that she only asks for help when she really needs it. Pt expressed resistance to learning something new in reaction to her children's desire to get her a computer, but she recognizes that it could be helpful for her to use for ordering groceries and other shopping tasks. Pt expressed that counseling is something she looks forward to each week.   CI helped pt identify and evaluate automatic thoughts to understand how her thinking is impacting her feelings and behaviors. CI also discussed using self-compassion during times of mental and physical suffering to decrease self-criticism, judgmental thoughts, and other negative self-talk.    Next Counseling Session: Thursday, January 01, 2020 at 10:15am via phone  Art [facility] Counseling Intern Voicemail:  [phone]

## 2019-12-25 NOTE — Progress Notes (Signed)
Virtual Visit via Telephone Note   This visit type was conducted due to national recommendations for restrictions regarding the COVID-19 Pandemic (e.g. social distancing) in an effort to limit this patient's exposure and mitigate transmission in our community.  Due to her co-morbid illnesses, this patient is at least at moderate risk for complications without adequate follow up.  This format is felt to be most appropriate for this patient at this time.  The patient did not have access to video technology/had technical difficulties with video requiring transitioning to audio format only (telephone).  All issues noted in this document were discussed and addressed.  No physical exam could be performed with this format.  Please refer to the patient's chart for her  consent to telehealth for [facility].   The patient was identified using 2 identifiers.  Date:  12/25/2019   ID:  [name], DOB 03-21-1941, MRN [id number]  Patient Location: Home Provider Location: Home  PCP:  [name], [name], MD  Cardiologist:  Quay Burow, MD  Heart failure cardiologist-Dr. Loralie Champagne Electrophysiologist:  None   Evaluation Performed:  Follow-Up Visit  Chief Complaint: Follow-up CAD, PAD, diastolic heart failure and pulmonary hypertension, CAF, aortic valve replacement  History of Present Illness:    [name] is a 79 y.o. mildly overweight, widowed Caucasian female mother of 44, grandmother to 72 grandchildren whose daughter unfortunately recently died of ALS after a long protracted course. I last saw her in the office  12/11/2018. .The patient has a history of CAD and valvular heart disease as well as PVOD. She is status post coronary artery bypass grafting x2 with a St. Jude AVR July 13, 2004. Her other problems include hypertension, hyperlipidemia, diabetes, and discontinued tobacco abuse. I dilated her left common iliac and redilated her for in-stent restenosis in 2006. We have been  following her lower extremity and carotid Dopplers since that time. She does have mild to moderate right and mild left ICA stenosis which has remained stable. She is neurologically asymptomatic. She denies chest pain, shortness of breath or claudication. [name] follows her lipid profile which was done most recently in March revealing a total cholesterol of 106, LDL of 53 and HDL of 29. Her most recent carotid Doppler in June did show moderate right and moderately severe left ICA stenosis which had not changed, and her lower extremity Dopplers revealed ABIs of 1 bilaterally with bilateral iliac disease. Since I saw her last a year ago she's remained stable and is asymptomatic otherwise. She does give right total hip replacement which is scheduled to be performed in Dr. Jean Rosenthal next week. I'm going to get a 2-D echocardiogram to assess her aortic prosthesis as well as a pharmacologic Myoview stress test to stratify her symptoms been over a year since her last functional study and she is a diabetic of 10 years post bypass grafting.she did have a high-risk Myoview back in 2014 that she had a false positive Myoview back in 2006 after her bypass surgery as well. She denies chest pain or shortness of breath. She ultimately never had her hip replacement.  She  has had laparoscopic cholecystectomy with subsequent diastolic heart failure requiring diuresis. She is also had GI bleeds requiring multiple blood transfusions. Her echo performed 02/24/2018 revealed normal LV systolic function, well-functioning aortic mechanical prosthesis with severe pulmonary hypertension and a PA pressure of 88 mmHg.  I did refer her to Dr. Aundra Dubin for evaluation treatment of pulmonary hypertension.  He performed  right heart cath 06/27/2018 revealing pulmonary pressures in the 60s with a PVR of 3.1 Wood units and only mildly elevated pulmonary capillary wedge pressure.  He is continuing to follow her for her pulmonary  hypertension.  Since I saw her a year ago she has been diagnosed with eosinophilic granulomatosis with polyangiitis and is being treated with prednisone and rituximab.  She does feel clinically improved.  She does not get out of the house much.  She denies chest pain or shortness of breath.  Her last 2D echo performed 05/06/2019 revealed normal EF with pulmonary hypertension and an PA SP of 88 mmHg.  Carotid Dopplers performed 2 years ago did show moderate right and moderate to severe left ICA stenosis and lower extremity Dopplers 04/29/2018 showed a patent left iliac stent.  She denies claudication.  She does have an excellent lipid profile performed 09/11/2019 with a total cholesterol 129, LDL of 60 and HDL of 31.   The patient does not have symptoms concerning for COVID-19 infection (fever, chills, cough, or new shortness of breath).    Past Medical History:  Diagnosis Date  . Aortic atherosclerosis ([place]) 01/23/2018  . Atrial fibrillation ([place])   . Carotid artery disease ([place])   . Cholelithiasis 01/23/2018  . Chronic anticoagulation   . Coronary artery disease    status post coronary artery bypass grafting times 07/10/2004  . Diabetes mellitus   . Hypercholesteremia   . Hypertension   . Mechanical heart valve present    H. aortic valve replacement at the time of bypass surgery October 2005  . Moderate to severe pulmonary hypertension ([place])   . Peripheral arterial disease ([facility])    history of left common iliac artery PTA and stenting for a chronic total occlusion 08/26/01   Past Surgical History:  Procedure Laterality Date  . AORTIC VALVE REPLACEMENT    . CARDIAC CATHETERIZATION  11/10/2004   40% right common illiac, 70% in stent restenosis of distal left common illiac,   . CARDIAC CATHETERIZATION  05/18/2004   LAD 50-70% midstenosis, RCA dominant w/50% stenosis, 50% Right common Illiac artery ostial stenosis, 90% in stent restenosis within midportion of left common illiac stent  .  Carotid Duplex  03/12/2012   RSA-elev. velocities suggestive of a 50-69% diameter reduction, Right&Left Bulb/Prox ICA-mild-mod.fibrous plaqueelevating Velocities abnormal study.  . CHOLECYSTECTOMY N/A 03/01/2018   Procedure: LAPAROSCOPIC CHOLECYSTECTOMY;  Surgeon: Judeth Horn, MD;  Location: [place];  Service: General;  Laterality: N/A;  . COLONOSCOPY WITH PROPOFOL N/A 01/22/2018   Procedure: COLONOSCOPY WITH PROPOFOL;  Surgeon: Wilford Corner, MD;  Location: [place];  Service: Endoscopy;  Laterality: N/A;  . ESOPHAGOGASTRODUODENOSCOPY (EGD) WITH PROPOFOL N/A 01/22/2018   Procedure: ESOPHAGOGASTRODUODENOSCOPY (EGD) WITH PROPOFOL;  Surgeon: Wilford Corner, MD;  Location: [place];  Service: Endoscopy;  Laterality: N/A;  . ESOPHAGOGASTRODUODENOSCOPY (EGD) WITH PROPOFOL N/A 11/21/2018   Procedure: ESOPHAGOGASTRODUODENOSCOPY (EGD) WITH PROPOFOL;  Surgeon: [name], MD;  Location: [employer] [facility];  Service: Gastroenterology;  Laterality: N/A;  . ESOPHAGOGASTRODUODENOSCOPY (EGD) WITH PROPOFOL Left 02/27/2019   Procedure: ESOPHAGOGASTRODUODENOSCOPY (EGD) WITH PROPOFOL;  Surgeon: Arta Silence, MD;  Location: [facility] ENDOSCOPY;  Service: Endoscopy;  Laterality: Left;  . GIVENS CAPSULE STUDY N/A 11/21/2018   Procedure: GIVENS CAPSULE STUDY;  Surgeon: [name], MD;  Location: [place];  Service: Gastroenterology;  Laterality: N/A;  To be deployed during EGD  . Lower Ext. Duplex  03/12/2012   Right Proximal CIA- vessel narrowing w/elevated velocities 0-49% diameter reduction. Right SFA-mild mixed density plaque throughout vessel.  [name]  NM MYOCAR PERF WALL MOTION  05/19/2010   protocol: Persantine, post stress EF 65%, negative for ischemia, low risk scan  . RIGHT HEART CATH N/A 06/27/2018   Procedure: RIGHT HEART CATH;  Surgeon: [name], MD;  Location: [place] [facility];  Service: Cardiovascular;  Laterality: N/A;  . TRANSTHORACIC ECHOCARDIOGRAM  08/29/2012   Moderately calcified annulus  of mitral valve, moderate regurg. of both mitral valve and tricuspid valve.      No outpatient medications have been marked as taking for the 12/25/19 encounter (Appointment) with Lorretta Harp, MD.     Allergies:   Flagyl [metronidazole], Coreg [carvedilol], Losartan, Verapamil, Zetia [ezetimibe], and Zocor [simvastatin - high dose]   Social History   Tobacco Use  . Smoking status: Former Smoker    Packs/day: 1.00    Years: 30.00    Pack years: 30.00    Types: Cigarettes  . Smokeless tobacco: Never Used  . Tobacco comment: quit smoking 2005  Substance Use Topics  . Alcohol use: No    Alcohol/week: 0.0 standard drinks  . Drug use: No     Family Hx: [name] family history is negative for Breast cancer.  ROS:   Please see the history of present illness.     All other systems reviewed and are negative.   Prior CV studies:   The following studies were reviewed today:  2D echocardiogram performed 05/06/2019, lower extremity Dopplers performed 04/29/2018, carotid Dopplers were performed 04/29/2018  Labs/Other Tests and Data Reviewed:    EKG:  No ECG reviewed.  Recent Labs: 05/30/2019: Magnesium 2.1 07/07/2019: B Natriuretic Peptide 311.8 12/08/2019: ALT 16; BUN 80; Creatinine 1.78; Hemoglobin 10.3; Platelets 157; Potassium 4.0; Sodium 136   Recent Lipid Panel Lab Results  Component Value Date/Time   CHOL 127 09/11/2019 12:19 PM   TRIG 180 (H) 09/11/2019 12:19 PM   HDL 31 (L) 09/11/2019 12:19 PM   CHOLHDL 4.1 09/11/2019 12:19 PM   LDLCALC 60 09/11/2019 12:19 PM    Wt Readings from Last 3 Encounters:  12/08/19 121 lb 4.8 oz (55 kg)  10/21/19 120 lb 3.2 oz (54.5 kg)  10/13/19 120 lb 11.2 oz (54.7 kg)     Objective:    Vital Signs:  There were no vitals taken for this visit.   VITAL SIGNS:  reviewed blood pressure measured by the patient at home was 153/59.  Her blood pressure at her recent primary care doctor's office visit was 254 systolic  ASSESSMENT &  PLAN:    1. Essential hypertension-history of essential hypertension with blood pressure measured today at 153/59.  She says that usually lower than this.  She is on clonidine, amlodipine, hydralazine, metoprolol, and spironolactone. 2. Hyperlipidemia-history of hyperlipidemia on atorvastatin with recent lipid profile performed 09/11/2019 revealing total cholesterol 129, LDL 60 and HDL 31 3. Mechanical heart valve-history of Saint Jude AVR at the time of CABG 07/13/2004.  Her last echo performed 04/28/2019 revealed a well-functioning aortic mechanical prosthesis.  She is on Coumadin. 4. Coronary artery disease-history of CAD status post CABG 07/13/2004 the Myoview performed in 2014 that was nonischemic.  She denies chest pain 5. Carotid artery disease-history of carotid artery disease by duplex ultrasound most recently performed 04/29/2018 revealing moderate right and moderate to severe left ICA stenosis.  We will repeat carotid Doppler studies. 6. Peripheral arterial disease-history of PAD status post left iliac stenting by myself 11/02 with Dopplers performed 04/29/2018 revealing a patent stent.  She denies claudication. 7. Atrial fibrillation-history  of paroxysmal atrial fibrillation 8. Severe pulmonary hypertension-history of severe pulmonary hypertension with a PASP of 88 mmHg and a right heart cath performed by Dr. Algernon Huxley 06/27/2018 revealing a PA pressure of 63 with a peripheral vascular resistance of 3.1 Wood units.  COVID-19 Education: The signs and symptoms of COVID-19 were discussed with the patient and how to seek care for testing (follow up with PCP or arrange E-visit).  The importance of social distancing was discussed today.  Time:   Today, I have spent 9 minutes with the patient with telehealth technology discussing the above problems.     Medication Adjustments/Labs and Tests Ordered: Current medicines are reviewed at length with the patient today.  Concerns regarding medicines are  outlined above.   Tests Ordered: No orders of the defined types were placed in this encounter.   Medication Changes: No orders of the defined types were placed in this encounter.   Follow Up:  In Person in 1 year(s)  Signed, Quay Burow, MD  12/25/2019 11:27 AM    [place]

## 2019-12-31 ENCOUNTER — Other Ambulatory Visit: Payer: Self-pay

## 2019-12-31 ENCOUNTER — Ambulatory Visit (INDEPENDENT_AMBULATORY_CARE_PROVIDER_SITE_OTHER): Payer: Medicare HMO | Admitting: Pharmacist Clinician (PhC)/ Clinical Pharmacy Specialist

## 2019-12-31 DIAGNOSIS — Z952 Presence of prosthetic heart valve: Secondary | ICD-10-CM

## 2019-12-31 DIAGNOSIS — Z7901 Long term (current) use of anticoagulants: Secondary | ICD-10-CM | POA: Diagnosis not present

## 2019-12-31 LAB — POCT INR: INR: 2.3 (ref 2.0–3.0)

## 2020-01-01 NOTE — Progress Notes (Signed)
Counseling intern called patient for a scheduled telehealth counseling session. Pt did not answer and CI left a voicemail encouraged pt to call to reschedule. CI will call pt on Monday to check-in if no return call is received.  Art Buff [place] Counseling Intern Voicemail:  [phone]

## 2020-01-02 ENCOUNTER — Encounter: Payer: Self-pay | Admitting: Podiatry

## 2020-01-02 ENCOUNTER — Ambulatory Visit: Payer: Medicare HMO | Admitting: Podiatry

## 2020-01-02 ENCOUNTER — Other Ambulatory Visit: Payer: Self-pay

## 2020-01-02 VITALS — Temp 98.0°F

## 2020-01-02 DIAGNOSIS — Z794 Long term (current) use of insulin: Secondary | ICD-10-CM

## 2020-01-02 DIAGNOSIS — N183 Chronic kidney disease, stage 3 unspecified: Secondary | ICD-10-CM | POA: Diagnosis not present

## 2020-01-02 DIAGNOSIS — M79675 Pain in left toe(s): Secondary | ICD-10-CM | POA: Diagnosis not present

## 2020-01-02 DIAGNOSIS — E0822 Diabetes mellitus due to underlying condition with diabetic chronic kidney disease: Secondary | ICD-10-CM

## 2020-01-02 DIAGNOSIS — L600 Ingrowing nail: Secondary | ICD-10-CM | POA: Diagnosis not present

## 2020-01-02 DIAGNOSIS — M79674 Pain in right toe(s): Secondary | ICD-10-CM

## 2020-01-02 DIAGNOSIS — B351 Tinea unguium: Secondary | ICD-10-CM | POA: Diagnosis not present

## 2020-01-02 NOTE — Patient Instructions (Addendum)
EPSOM SALT FOOT SOAK INSTRUCTIONS  Shopping List:  A. Plain epsom salt (not scented) B. Neosporin Cream/Ointment or Bacitracin Cream/Ointment C. 1-inch fabric band-aids   1.  Place 1/4 cup of epsom salts in 2 quarts of warm tap water. IF YOU ARE DIABETIC, OR HAVE NEUROPATHY, CHECK THE TEMPERATURE OF THE WATER WITH YOUR ELBOW.  2.  Submerge your foot/feet in the solution and soak for 10-15 minutes.      3.  Next, remove your foot or feet from solution, blot dry the affected area.    4.  Apply antibiotic ointment and cover with fabric band-aid .  5.  This soak should be done once a day for 7 days.   6.  Monitor for any signs/symptoms of infection such as redness, swelling, odor, drainage, increased pain, or non-healing of digit.   7.  Please do not hesitate to call the office and speak to a Nurse or Doctor if you have questions.   8.  If you experience fever, chills, nightsweats, nausea or vomiting with worsening of digit, please go to the emergency room.   Diabetes Mellitus and Foot Care Foot care is an important part of your health, especially when you have diabetes. Diabetes may cause you to have problems because of poor blood flow (circulation) to your feet and legs, which can cause your skin to:  Become thinner and drier.  Break more easily.  Heal more slowly.  Peel and crack. You may also have nerve damage (neuropathy) in your legs and feet, causing decreased feeling in them. This means that you may not notice minor injuries to your feet that could lead to more serious problems. Noticing and addressing any potential problems early is the best way to prevent future foot problems. How to care for your feet Foot hygiene  Wash your feet daily with warm water and mild soap. Do not use hot water. Then, pat your feet and the areas between your toes until they are completely dry. Do not soak your feet as this can dry your skin.  Trim your toenails straight across. Do not dig under  them or around the cuticle. File the edges of your nails with an emery board or nail file.  Apply a moisturizing lotion or petroleum jelly to the skin on your feet and to dry, brittle toenails. Use lotion that does not contain alcohol and is unscented. Do not apply lotion between your toes. Shoes and socks  Wear clean socks or stockings every day. Make sure they are not too tight. Do not wear knee-high stockings since they may decrease blood flow to your legs.  Wear shoes that fit properly and have enough cushioning. Always look in your shoes before you put them on to be sure there are no objects inside.  To break in new shoes, wear them for just a few hours a day. This prevents injuries on your feet. Wounds, scrapes, corns, and calluses  Check your feet daily for blisters, cuts, bruises, sores, and redness. If you cannot see the bottom of your feet, use a mirror or ask someone for help.  Do not cut corns or calluses or try to remove them with medicine.  If you find a minor scrape, cut, or break in the skin on your feet, keep it and the skin around it clean and dry. You may clean these areas with mild soap and water. Do not clean the area with peroxide, alcohol, or iodine.  If you have a wound, scrape, corn,  or callus on your foot, look at it several times a day to make sure it is healing and not infected. Check for: ? Redness, swelling, or pain. ? Fluid or blood. ? Warmth. ? Pus or a bad smell. General instructions  Do not cross your legs. This may decrease blood flow to your feet.  Do not use heating pads or hot water bottles on your feet. They may burn your skin. If you have lost feeling in your feet or legs, you may not know this is happening until it is too late.  Protect your feet from hot and cold by wearing shoes, such as at the beach or on hot pavement.  Schedule a complete foot exam at least once a year (annually) or more often if you have foot problems. If you have foot  problems, report any cuts, sores, or bruises to your health care provider immediately. Contact a health care provider if:  You have a medical condition that increases your risk of infection and you have any cuts, sores, or bruises on your feet.  You have an injury that is not healing.  You have redness on your legs or feet.  You feel burning or tingling in your legs or feet.  You have pain or cramps in your legs and feet.  Your legs or feet are numb.  Your feet always feel cold.  You have pain around a toenail. Get help right away if:  You have a wound, scrape, corn, or callus on your foot and: ? You have pain, swelling, or redness that gets worse. ? You have fluid or blood coming from the wound, scrape, corn, or callus. ? Your wound, scrape, corn, or callus feels warm to the touch. ? You have pus or a bad smell coming from the wound, scrape, corn, or callus. ? You have a fever. ? You have a red line going up your leg. Summary  Check your feet every day for cuts, sores, red spots, swelling, and blisters.  Moisturize feet and legs daily.  Wear shoes that fit properly and have enough cushioning.  If you have foot problems, report any cuts, sores, or bruises to your health care provider immediately.  Schedule a complete foot exam at least once a year (annually) or more often if you have foot problems. This information is not intended to replace advice given to you by your health care provider. Make sure you discuss any questions you have with your health care provider. Document Revised: 06/18/2019 Document Reviewed: 10/27/2016 Elsevier Patient Education  [place].

## 2020-01-05 ENCOUNTER — Other Ambulatory Visit: Payer: Medicare HMO

## 2020-01-05 ENCOUNTER — Ambulatory Visit: Payer: Medicare HMO

## 2020-01-05 ENCOUNTER — Inpatient Hospital Stay: Payer: Medicare HMO

## 2020-01-05 ENCOUNTER — Other Ambulatory Visit: Payer: Self-pay

## 2020-01-05 VITALS — BP 131/64 | HR 48 | Temp 97.9°F | Resp 18 | Wt 124.2 lb

## 2020-01-05 DIAGNOSIS — M8580 Other specified disorders of bone density and structure, unspecified site: Secondary | ICD-10-CM | POA: Diagnosis not present

## 2020-01-05 DIAGNOSIS — D61818 Other pancytopenia: Secondary | ICD-10-CM

## 2020-01-05 DIAGNOSIS — D500 Iron deficiency anemia secondary to blood loss (chronic): Secondary | ICD-10-CM

## 2020-01-05 DIAGNOSIS — D594 Other nonautoimmune hemolytic anemias: Secondary | ICD-10-CM

## 2020-01-05 DIAGNOSIS — I7782 Antineutrophilic cytoplasmic antibody (ANCA) vasculitis: Secondary | ICD-10-CM

## 2020-01-05 DIAGNOSIS — D631 Anemia in chronic kidney disease: Secondary | ICD-10-CM | POA: Diagnosis not present

## 2020-01-05 DIAGNOSIS — I776 Arteritis, unspecified: Secondary | ICD-10-CM

## 2020-01-05 DIAGNOSIS — Z5112 Encounter for antineoplastic immunotherapy: Secondary | ICD-10-CM | POA: Diagnosis not present

## 2020-01-05 DIAGNOSIS — N189 Chronic kidney disease, unspecified: Secondary | ICD-10-CM | POA: Diagnosis not present

## 2020-01-05 DIAGNOSIS — R7402 Elevation of levels of lactic acid dehydrogenase (LDH): Secondary | ICD-10-CM | POA: Diagnosis not present

## 2020-01-05 DIAGNOSIS — I739 Peripheral vascular disease, unspecified: Secondary | ICD-10-CM

## 2020-01-05 DIAGNOSIS — D508 Other iron deficiency anemias: Secondary | ICD-10-CM

## 2020-01-05 DIAGNOSIS — D696 Thrombocytopenia, unspecified: Secondary | ICD-10-CM

## 2020-01-05 LAB — CBC WITH DIFFERENTIAL/PLATELET
Abs Immature Granulocytes: 0.05 10*3/uL (ref 0.00–0.07)
Basophils Absolute: 0.1 10*3/uL (ref 0.0–0.1)
Basophils Relative: 1 %
Eosinophils Absolute: 0.2 10*3/uL (ref 0.0–0.5)
Eosinophils Relative: 3 %
HCT: 29.8 % — ABNORMAL LOW (ref 36.0–46.0)
Hemoglobin: 9.8 g/dL — ABNORMAL LOW (ref 12.0–15.0)
Immature Granulocytes: 1 %
Lymphocytes Relative: 17 %
Lymphs Abs: 1.2 10*3/uL (ref 0.7–4.0)
MCH: 30.2 pg (ref 26.0–34.0)
MCHC: 32.9 g/dL (ref 30.0–36.0)
MCV: 92 fL (ref 80.0–100.0)
Monocytes Absolute: 0.7 10*3/uL (ref 0.1–1.0)
Monocytes Relative: 9 %
Neutro Abs: 4.8 10*3/uL (ref 1.7–7.7)
Neutrophils Relative %: 69 %
Platelets: 175 10*3/uL (ref 150–400)
RBC: 3.24 MIL/uL — ABNORMAL LOW (ref 3.87–5.11)
RDW: 16.5 % — ABNORMAL HIGH (ref 11.5–15.5)
WBC: 7 10*3/uL (ref 4.0–10.5)
nRBC: 0 % (ref 0.0–0.2)

## 2020-01-05 LAB — CMP (CANCER CENTER ONLY)
ALT: 16 U/L (ref 0–44)
AST: 25 U/L (ref 15–41)
Albumin: 3.5 g/dL (ref 3.5–5.0)
Alkaline Phosphatase: 78 U/L (ref 38–126)
Anion gap: 11 (ref 5–15)
BUN: 84 mg/dL — ABNORMAL HIGH (ref 8–23)
CO2: 21 mmol/L — ABNORMAL LOW (ref 22–32)
Calcium: 9.2 mg/dL (ref 8.9–10.3)
Chloride: 104 mmol/L (ref 98–111)
Creatinine: 1.8 mg/dL — ABNORMAL HIGH (ref 0.44–1.00)
GFR, Est AFR Am: 30 mL/min — ABNORMAL LOW (ref >60)
GFR, Estimated: 26 mL/min — ABNORMAL LOW (ref >60)
Glucose, Bld: 141 mg/dL — ABNORMAL HIGH (ref 70–99)
Potassium: 4.2 mmol/L (ref 3.5–5.1)
Sodium: 136 mmol/L (ref 135–145)
Total Bilirubin: 0.4 mg/dL (ref 0.3–1.2)
Total Protein: 6.4 g/dL — ABNORMAL LOW (ref 6.5–8.1)

## 2020-01-05 LAB — FERRITIN: Ferritin: 193 ng/mL (ref 11–307)

## 2020-01-05 LAB — RETICULOCYTES
Immature Retic Fract: 7.3 % (ref 2.3–15.9)
RBC.: 3.18 MIL/uL — ABNORMAL LOW (ref 3.87–5.11)
Retic Count, Absolute: 47.1 10*3/uL (ref 19.0–186.0)
Retic Ct Pct: 1.5 % (ref 0.4–3.1)

## 2020-01-05 LAB — IRON AND TIBC
Iron: 109 ug/dL (ref 41–142)
Saturation Ratios: 42 % (ref 21–57)
TIBC: 261 ug/dL (ref 236–444)
UIBC: 153 ug/dL (ref 120–384)

## 2020-01-05 LAB — SAVE SMEAR(SSMR), FOR PROVIDER SLIDE REVIEW

## 2020-01-05 MED ORDER — EPOETIN ALFA-EPBX 40000 UNIT/ML IJ SOLN
INTRAMUSCULAR | Status: AC
  Filled 2020-01-05: qty 1

## 2020-01-05 MED ORDER — ACETAMINOPHEN 325 MG PO TABS
ORAL_TABLET | ORAL | Status: AC
  Filled 2020-01-05: qty 2

## 2020-01-05 MED ORDER — ACETAMINOPHEN 325 MG PO TABS
650.0000 mg | ORAL_TABLET | Freq: Once | ORAL | Status: AC
  Administered 2020-01-05: 650 mg via ORAL

## 2020-01-05 MED ORDER — SODIUM CHLORIDE 0.9 % IV SOLN
375.0000 mg/m2 | Freq: Once | INTRAVENOUS | Status: AC
  Administered 2020-01-05: 600 mg via INTRAVENOUS
  Filled 2020-01-05: qty 10

## 2020-01-05 MED ORDER — EPOETIN ALFA-EPBX 40000 UNIT/ML IJ SOLN
40000.0000 [IU] | Freq: Once | INTRAMUSCULAR | Status: AC
  Administered 2020-01-05: 40000 [IU] via SUBCUTANEOUS

## 2020-01-05 MED ORDER — DIPHENHYDRAMINE HCL 25 MG PO CAPS
ORAL_CAPSULE | ORAL | Status: AC
  Filled 2020-01-05: qty 1

## 2020-01-05 MED ORDER — DIPHENHYDRAMINE HCL 25 MG PO CAPS
25.0000 mg | ORAL_CAPSULE | Freq: Once | ORAL | Status: AC
  Administered 2020-01-05: 25 mg via ORAL

## 2020-01-05 MED ORDER — SODIUM CHLORIDE 0.9 % IV SOLN
Freq: Once | INTRAVENOUS | Status: AC
  Filled 2020-01-05: qty 250

## 2020-01-05 NOTE — Patient Instructions (Signed)
[facility] °Discharge Instructions for Patients Receiving Chemotherapy ° °Today you received the following chemotherapy agents: Rituximab ° °To help prevent nausea and vomiting after your treatment, we encourage you to take your nausea medication as directed.  °  °If you develop nausea and vomiting that is not controlled by your nausea medication, call the clinic.  ° °BELOW ARE SYMPTOMS THAT SHOULD BE REPORTED IMMEDIATELY: °· *FEVER GREATER THAN 100.5 F °· *CHILLS WITH OR WITHOUT FEVER °· NAUSEA AND VOMITING THAT IS NOT CONTROLLED WITH YOUR NAUSEA MEDICATION °· *UNUSUAL SHORTNESS OF BREATH °· *UNUSUAL BRUISING OR BLEEDING °· TENDERNESS IN MOUTH AND THROAT WITH OR WITHOUT PRESENCE OF ULCERS °· *URINARY PROBLEMS °· *BOWEL PROBLEMS °· UNUSUAL RASH °Items with * indicate a potential emergency and should be followed up as soon as possible. ° °Feel free to call the clinic should you have any questions or concerns. The clinic phone number is [phone]. ° °Please show the CHEMO ALERT CARD at check-in to the Emergency Department and triage nurse. ° ° °

## 2020-01-05 NOTE — Progress Notes (Signed)
Subjective: [name] presents today for follow up of at risk foot care. Pt has h/o NIDDM with chronic kidney disease and painful mycotic nails b/l that are difficult to trim. Pain interferes with ambulation. Aggravating factors include wearing enclosed shoe gear. Pain is relieved with periodic professional debridement.  She relates tenderness to left great toe on today. States it is tender when wearing shoes. Denies any drainage.    Allergies  Allergen Reactions  . Flagyl [Metronidazole] Rash    ALL-OVER BODY RASH  . Coreg [Carvedilol] Other (See Comments)    Terrible cramping in the feet and had a lot of bowel movements, but not diarrhea  . Losartan Swelling    Patient doesn't recall site of swelling  . Verapamil Hives  . Zetia [Ezetimibe] Other (See Comments)    Reaction not recalled  . Zocor [Simvastatin - High Dose] Other (See Comments)    Reaction not recalled     Objective: Vitals:   01/02/20 1130  Temp: 55 F (36.7 C)    Pt 79 y.o. year old female  in NAD. AAO x 3.   Vascular Examination:  Capillary fill time to digits <3 seconds b/l. Palpable DP pulses b/l. Palpable PT pulses b/l. Pedal hair absent b/l Skin temperature gradient within normal limits b/l.  Dermatological Examination: Pedal skin is thin shiny, atrophic bilaterally. No open wounds bilaterally. No interdigital macerations bilaterally. Toenails 1-5 b/l elongated, dystrophic, thickened, crumbly with subungual debris and tenderness to dorsal palpation. Ingrown toenail left hallux medial border, with blanchable erythema and POP. No purulence, no drainage, no odor.  Musculoskeletal: Normal muscle strength 5/5 to all lower extremity muscle groups bilaterally, no gross bony deformities bilaterally and no pain crepitus or joint limitation noted with ROM b/l  Neurological: Protective sensation intact 5/5 intact bilaterally with 10g monofilament b/l  Assessment: 1. Pain due to onychomycosis of toenails of  both feet   2. Ingrown toenail without infection   3. Diabetes mellitus due to underlying condition with stage 3 chronic kidney disease, with long-term current use of insulin, unspecified whether stage 3a or 3b CKD ([place])    Plan: -[name] could not tolerate debridement of left hallux and agreed to local block of digit for debridement of nail only. Betadine prep applied to left hallux.  3 cc of 50/50 mixture of 2% lidocaine plain and 0.5% marcaine plain administered via hallux block. Left hallux nailplate debrided and curretaged of offending border. Digit cleansed with alcohol. Triple antibiotic ointment and band-aid applied. Remaining toenails 2-5 b/l and right hallux were debrided in length and girth with sterile nail nippers and dremel without iatrogenic bleeding.  -Dispensed written instructions for epsom salt/warm water soaks for left foot.  -Patient to continue soft, supportive shoe gear daily. -Patient to report any pedal injuries to medical professional immediately. -Follow up 3 weeks for left great toe check. -Patient/POA to call should there be question/concern in the interim.  Return in about 3 weeks (around 01/23/2020).

## 2020-01-08 ENCOUNTER — Other Ambulatory Visit (HOSPITAL_COMMUNITY): Payer: Self-pay | Admitting: Cardiology

## 2020-01-20 DIAGNOSIS — H25043 Posterior subcapsular polar age-related cataract, bilateral: Secondary | ICD-10-CM | POA: Diagnosis not present

## 2020-01-20 DIAGNOSIS — H2511 Age-related nuclear cataract, right eye: Secondary | ICD-10-CM | POA: Diagnosis not present

## 2020-01-20 DIAGNOSIS — H25013 Cortical age-related cataract, bilateral: Secondary | ICD-10-CM | POA: Diagnosis not present

## 2020-01-20 DIAGNOSIS — H43823 Vitreomacular adhesion, bilateral: Secondary | ICD-10-CM | POA: Diagnosis not present

## 2020-01-20 DIAGNOSIS — H2513 Age-related nuclear cataract, bilateral: Secondary | ICD-10-CM | POA: Diagnosis not present

## 2020-01-21 ENCOUNTER — Encounter (HOSPITAL_COMMUNITY): Payer: Medicare HMO | Admitting: Cardiology

## 2020-01-21 NOTE — Progress Notes (Signed)
Counseling intern(CI) called patient on 01/21/20 in response to a voicemail message about scheduling a telehealth counseling session. Pt stated Thursday, January 22, 2020 at 10:00am is a good time for her to talk. CI agreed to call the pt at this time.  Art Buff [place] Counseling Intern Voicemail:  [phone]

## 2020-01-23 ENCOUNTER — Other Ambulatory Visit: Payer: Self-pay | Admitting: Pharmacist

## 2020-01-23 NOTE — Patient Outreach (Signed)
[facility] ([facility])  [facility] Quality Pharmacy Team    [facility] pharmacy case will be closed as our team is transitioning from the [facility] Care Management Department into the [facility] Quality Department and will no longer be using [facility] for documentation purposes.      Danell Verno J. Calise Dunckel, PharmD, BCACP [facility] Clinical Pharmacist [phone]   

## 2020-01-26 ENCOUNTER — Other Ambulatory Visit: Payer: Self-pay

## 2020-01-26 NOTE — Patient Outreach (Signed)
[facility]) Care Management  [place]  01/26/2020   [name] May 16, 1941 [id number]  Subjective: Telephone call to patient for disease management follow up. Patient reports she is doing ok. She reports that her bathroom renovation is almost complete except for some minor things.  She reports that her weight remains at 121 lbs.  Discussed how fluid weight gain causes fatigue and to be on the look out for any increased swelling, weight gain or shortness of breath and notify physician for any changes.  She verbalized understanding.  She reports that she is pacing self with activities due to tiring easily.  Encouraged patient to do short parts of activities take a rest then go back to the activity. She verbalized understanding and states she tries to do that.  Patient denies any questions or concerns.     Objective:   Encounter Medications:  Outpatient Encounter Medications as of 01/26/2020  Medication Sig  . acetaminophen (TYLENOL) 325 MG tablet Take 650 mg by mouth every 6 (six) hours as needed for mild pain.   [name] allopurinol (ZYLOPRIM) 100 MG tablet Take 100 mg by mouth daily.  [name] ALPRAZolam (XANAX) 0.5 MG tablet Take 0.25 mg by mouth at bedtime.   [name] amLODipine (NORVASC) 10 MG tablet Take 1 tablet (10 mg total) by mouth daily.  [name] atorvastatin (LIPITOR) 80 MG tablet Take 1 tablet (80 mg total) by mouth daily at 6 PM.  . Cholecalciferol (VITAMIN D3) 1000 UNITS CAPS Take 2,000 Units by mouth daily with lunch.   . cloNIDine (CATAPRES) 0.2 MG tablet Take 1 tablet (0.2 mg total) by mouth 2 (two) times daily.  . colchicine 0.6 MG tablet   . Cyanocobalamin (VITAMIN B12) 1000 MCG TBCR Take 1,000 mcg by mouth daily with lunch.   . furosemide (LASIX) 40 MG tablet Take 40 mg by mouth 2 (two) times daily.  . hydrALAZINE (APRESOLINE) 100 MG tablet TAKE 1 TABLET THREE TIMES DAILY  . lactose free nutrition (BOOST) LIQD Take 237 mLs by mouth daily. (To supplement meals)  .  magnesium oxide (MAG-OX) 400 (241.3 Mg) MG tablet Take 1 tablet (400 mg total) by mouth daily.  . metFORMIN (GLUCOPHAGE) 500 MG tablet Take 500 mg by mouth 2 (two) times daily with a meal.   . metoprolol tartrate (LOPRESSOR) 25 MG tablet Take 0.5 tablets (12.5 mg total) by mouth daily.  . pantoprazole (PROTONIX) 40 MG tablet Take 40 mg by mouth 2 (two) times daily.   [name] spironolactone (ALDACTONE) 25 MG tablet Take 1 tablet (25 mg total) by mouth daily.  . tadalafil, PAH, (ADCIRCA) 20 MG tablet Take 2 tablets (40 mg total) by mouth daily.  . vitamin C (ASCORBIC ACID) 500 MG tablet Take 500 mg by mouth daily with lunch.  . warfarin (COUMADIN) 5 MG tablet TAKE 1.5 TO 2 TABLETS BY MOUTH DAILY AS DIRECTED BY COUMADIN CLINIC   No facility-administered encounter medications on file as of 01/26/2020.    Functional Status:  In your present state of health, do you have any difficulty performing the following activities: 01/26/2020 07/31/2019  Hearing? N N  Vision? N N  Difficulty concentrating or making decisions? N N  Walking or climbing stairs? Y Y  Comment weakness weakness  Dressing or bathing? N N  Doing errands, shopping? Tempie Donning  Comment son and neighbor assists as needed son or neighbor assists  Conservation officer, nature and eating ? N N  Using the Toilet? N N  In the past  six months, have you accidently leaked urine? Y Y  Comment occasional occasional  Do you have problems with loss of bowel control? N N  Managing your Medications? N N  Managing your Finances? N N  Housekeeping or managing your Housekeeping? Tempie Donning  Comment family assists as needed family assists  Some recent data might be hidden    Fall/Depression Screening: Fall Risk  01/26/2020 11/25/2019 07/31/2019  Falls in the past year? 0 0 0  Comment - - -  Number falls in past yr: - - -  Injury with Fall? - - -  Risk for fall due to : - - -  Risk for fall due to: Comment - - -  Follow up - - -   PHQ 2/9 Scores 01/26/2020 11/25/2019  07/31/2019 05/12/2019 03/24/2019 07/09/2018 06/13/2018  PHQ - 2 Score 1 1 2 3 1  0 0  PHQ- 9 Score - - 9 9 - - -    Assessment: Patient continues to manage chronic illnesses and benefits from disease management follow up.     Plan:  [facility] CM Care Plan Problem One     Most Recent Value  Care Plan Problem One  Activity intolerance related to anemia  Role Documenting the Problem One  Care Management Telephonic [place] for Problem One  Active  [facility] Long Term Goal   Patient will report continued ability to complete activities of daily living over the next 60 days.  [facility] Long Term Goal Start Date  11/25/19  Interventions for Problem One Long Term Goal  Discussed with patient fatigue.  Discussed ways to conserve energy by doing partial activites then taking a rest break before finishing activities.  Also discussed eating 3 meals a day to assist with energy.      RN CM will contact patient again in the month of June and patient agreeable.  [name], RN, MSN [place] Management Care Management Coordinator Direct Line [phone] Cell [phone] Toll Free: [phone]  Fax: [phone]

## 2020-01-27 ENCOUNTER — Ambulatory Visit: Payer: Self-pay

## 2020-01-27 ENCOUNTER — Encounter: Payer: Self-pay | Admitting: Podiatry

## 2020-01-27 ENCOUNTER — Ambulatory Visit: Payer: Medicare HMO | Admitting: Podiatry

## 2020-01-27 ENCOUNTER — Other Ambulatory Visit: Payer: Self-pay

## 2020-01-27 VITALS — Temp 97.9°F

## 2020-01-27 DIAGNOSIS — L600 Ingrowing nail: Secondary | ICD-10-CM

## 2020-01-27 DIAGNOSIS — M79675 Pain in left toe(s): Secondary | ICD-10-CM | POA: Diagnosis not present

## 2020-01-27 NOTE — Progress Notes (Signed)
Pharmacist Chemotherapy Monitoring - Follow Up Assessment    I verify that I have reviewed each item in the below checklist:  . Regimen for the patient is scheduled for the appropriate day and plan matches scheduled date. [name] Appropriate non-routine labs are ordered dependent on drug ordered. . If applicable, additional medications reviewed and ordered per protocol based on lifetime cumulative doses and/or treatment regimen.   Plan for follow-up and/or issues identified: No . I-vent associated with next due treatment: No . MD and/or nursing notified: No  Philomena Course 01/27/2020 10:28 AM

## 2020-01-27 NOTE — Patient Instructions (Signed)
Diabetes Mellitus and Foot Care Foot care is an important part of your health, especially when you have diabetes. Diabetes may cause you to have problems because of poor blood flow (circulation) to your feet and legs, which can cause your skin to:  Become thinner and drier.  Break more easily.  Heal more slowly.  Peel and crack. You may also have nerve damage (neuropathy) in your legs and feet, causing decreased feeling in them. This means that you may not notice minor injuries to your feet that could lead to more serious problems. Noticing and addressing any potential problems early is the best way to prevent future foot problems. How to care for your feet Foot hygiene  Wash your feet daily with warm water and mild soap. Do not use hot water. Then, pat your feet and the areas between your toes until they are completely dry. Do not soak your feet as this can dry your skin.  Trim your toenails straight across. Do not dig under them or around the cuticle. File the edges of your nails with an emery board or nail file.  Apply a moisturizing lotion or petroleum jelly to the skin on your feet and to dry, brittle toenails. Use lotion that does not contain alcohol and is unscented. Do not apply lotion between your toes. Shoes and socks  Wear clean socks or stockings every day. Make sure they are not too tight. Do not wear knee-high stockings since they may decrease blood flow to your legs.  Wear shoes that fit properly and have enough cushioning. Always look in your shoes before you put them on to be sure there are no objects inside.  To break in new shoes, wear them for just a few hours a day. This prevents injuries on your feet. Wounds, scrapes, corns, and calluses  Check your feet daily for blisters, cuts, bruises, sores, and redness. If you cannot see the bottom of your feet, use a mirror or ask someone for help.  Do not cut corns or calluses or try to remove them with medicine.  If you  find a minor scrape, cut, or break in the skin on your feet, keep it and the skin around it clean and dry. You may clean these areas with mild soap and water. Do not clean the area with peroxide, alcohol, or iodine.  If you have a wound, scrape, corn, or callus on your foot, look at it several times a day to make sure it is healing and not infected. Check for: ? Redness, swelling, or pain. ? Fluid or blood. ? Warmth. ? Pus or a bad smell. General instructions  Do not cross your legs. This may decrease blood flow to your feet.  Do not use heating pads or hot water bottles on your feet. They may burn your skin. If you have lost feeling in your feet or legs, you may not know this is happening until it is too late.  Protect your feet from hot and cold by wearing shoes, such as at the beach or on hot pavement.  Schedule a complete foot exam at least once a year (annually) or more often if you have foot problems. If you have foot problems, report any cuts, sores, or bruises to your health care provider immediately. Contact a health care provider if:  You have a medical condition that increases your risk of infection and you have any cuts, sores, or bruises on your feet.  You have an injury that is not   healing.  You have redness on your legs or feet.  You feel burning or tingling in your legs or feet.  You have pain or cramps in your legs and feet.  Your legs or feet are numb.  Your feet always feel cold.  You have pain around a toenail. Get help right away if:  You have a wound, scrape, corn, or callus on your foot and: ? You have pain, swelling, or redness that gets worse. ? You have fluid or blood coming from the wound, scrape, corn, or callus. ? Your wound, scrape, corn, or callus feels warm to the touch. ? You have pus or a bad smell coming from the wound, scrape, corn, or callus. ? You have a fever. ? You have a red line going up your leg. Summary  Check your feet every day  for cuts, sores, red spots, swelling, and blisters.  Moisturize feet and legs daily.  Wear shoes that fit properly and have enough cushioning.  If you have foot problems, report any cuts, sores, or bruises to your health care provider immediately.  Schedule a complete foot exam at least once a year (annually) or more often if you have foot problems. This information is not intended to replace advice given to you by your health care provider. Make sure you discuss any questions you have with your health care provider. Document Revised: 06/18/2019 Document Reviewed: 10/27/2016 Elsevier Patient Education  2020 [employer]  

## 2020-01-29 NOTE — Progress Notes (Signed)
Subjective:  [name] presents today s/p debridement and curretage of medial nail border left great toe. She states her toe feels much better, but is still sore when she wears shoes.  Objective: Vitals:   01/27/20 1111  Temp: 97.9 F (36.6 C)   General: 79 y.o. female WD, WN IN NAD. AAO X 3.  Vascular Examination: Capillary fill time to digits <3 seconds b/l. Palpable DP pulses b/l. Palpable PT pulses b/l. Pedal hair absent b/l Skin temperature gradient within normal limits b/l.  Dermatological Examination: Pedal skin is thin shiny, atrophic bilaterally. No open wounds bilaterally. No interdigital macerations bilaterally. Procedure site noted to be completely healed with no erythema, no edema, no drainage, no purulence L hallux.  Musculoskeletal Examination: Normal muscle strength 5/5 to all lower extremity muscle groups bilaterally, no gross bony deformities bilaterally and no pain crepitus or joint limitation noted with ROM b/l  Neurological Examination: Protective sensation intact 5/5 intact bilaterally with 10g monofilament b/l. Vibratory sensation intact b/l. Babinski reflex negative b/l. Clonus negative b/l.  Assessment: Ingrown toenail resolved left hallux Pain in left great toe  Plan: -Patient to continue soft, supportive shoe gear daily. -Patient to report any pedal injuries to medical professional immediately. Medial border left hallux debrided and curretaged. Border cleansed with alcohol and TAO applied. She is to continue applying Neosporin Cream once daily. -Patient/POA to call should there be question/concern in the interim.  Return in about 6 weeks (around 03/09/2020) for diabetic nail trim.

## 2020-01-29 NOTE — Progress Notes (Signed)
Counseling intern spoke to pt on 01/29/2020 for a telehealth counseling session via phone. Counseling intern provided space for pt to open up about experiences and responded with empathy, compassion, and normalization of feelings.   Pt reported she has been thinking about talking to her children about her emotional needs, specifically about visiting on the weekends so she is not alone all weekend. Pt and CI discussed complications of relationships and how when children have a spouse and their own children things become even more complicated. Pt feels her family does not prioritize her, but they don't want her to drive herself anywhere which causes the pt to feel dependent, alone, and sad. Pt and CI discussed adjusting to this stage in her life. Pt stated she wants to feel less sad and lonely and aims to be more confident and optimistic. Pt stated she would like to plan a family gathering over the summer and she imagines that short visits from one of her children over the weekend would help improve her mood. Pt stated she will learn more about her next course of tx on Monday, but wonders if she will have to continue tx for the rest of her life. Pt reported she is disappointed that CI will no longer be an available resource at [facility].   CI helped pt process emotional responses to recent events. CI informed pt that next week is her last week at [facility]. CI plans to offer the patient referrals to community providers and will discuss barriers to seeking tx.   Termination Counseling Session: Friday, February 06, 2020 at 10:30am via phone  Art Buff [facility] Counseling Intern Voicemail:  [phone]

## 2020-01-30 ENCOUNTER — Ambulatory Visit: Payer: Self-pay | Admitting: Pharmacist

## 2020-02-01 NOTE — Progress Notes (Signed)
[name]  [phone] [phone]) [phone]    ID: [name] DOB: [birth year]  MR#: [id number]  DXI#:[id number]  Patient Care Team: [name], MD as PCP - General (Internal Medicine) [name], MD as PCP - Advanced Heart Failure (Cardiology) Lorretta Harp, MD as PCP - Cardiology (Cardiology) Doraine Schexnider, [name], MD as Consulting Physician (Hematology and Oncology) Arta Silence, MD as Consulting Physician (Gastroenterology) [name], MD as Consulting Physician (Cardiology) Marzetta Board, DPM as Consulting Physician (Podiatry) Gavin Pound, MD as Consulting Physician (Rheumatology) Jon Billings, RN as [facility] Management OTHER MD:   CHIEF COMPLAINT: anemia of renal failure; vasculitis  CURRENT TREATMENT: [Feraheme], Retacrit, rituximab   INTERVAL HISTORY: [name] returns today for follow-up and treatment of her multifactorial anemia and vasculitis..  She continues on Retacrit for her anemia of renal insufficiency, currently at 40K units every 4 weeks so long as her hemoglobin is not over 11.  She last received this on 01/05/2020.  At the last 2 determinations including today's the hemoglobin has been just under 10, which is below target  She also receives rituximab, every 4 weeks, with a dose due today.  She had a brief episode of leukopenia from this medication but that has resolved.  She does otherwise well with the rituximab except that she has to wait to receive it and she is looking forward to the day when she no longer needs it.  She has been transfusion free since 05/27/2019. Her Hb goal is 10-11: Lab Results  Component Value Date   HGB 9.8 (L) 02/02/2020   HGB 9.8 (L) 01/05/2020   HGB 10.3 (L) 12/08/2019   HGB 10.3 (L) 11/10/2019   HGB 11.2 (L) 10/28/2019   Her ferritin remains >100 after her last feraheme dose, 05/12/2019: Lab Results  Component Value Date   FERRITIN 193  01/05/2020   FERRITIN 196 12/08/2019   FERRITIN 188 11/10/2019   FERRITIN 257 10/28/2019   FERRITIN 164 10/13/2019     REVIEW OF SYSTEMS: [name] continues to improve clinically and tells me she has started sewing again.  This is something she greatly enjoys doing.  She has had both her Pfizer vaccine doses and did fine with that.  She says she has long had thinning hair but it is much thinner now and it is noticeable.  She has had no problems with bleeding rash or fever.  She would like to start going shopping on her own but she tells me her children really do not want her to yet done I agree with that.  She has not had a fall since November of last year.  She is now using a cane only.  A detailed review of systems was otherwise stable   HISTORY OF CURRENT ILLNESS: From the original consult note:  [name] is an 79 y.o. female from [place], [place].  She has a past medical history significant forPAD s/p stenting; moderate to severe pulmonary HTN; afib and AVR on Coumadin; HTN; HLD; DM; and CAD s/p CABG. the patient was admitted to the hospital for anemia.  She was having increasing fatigue and shortness of breath and had routine lab work drawn which showed a hemoglobin of 6.6.  Due to her abnormal lab work, she was advised to come to the emergency room for admission.  She was also admitted in February 2020 for symptomatic anemia.  She reports she has been anemic for at least a year and  has been taking oral iron.  She underwent a colonoscopy and EGD on 01/22/2018.  The colonoscopy showed diverticulosis in the sigmoid colon and descending colon, internal hemorrhoids.  The upper endoscopy was normal except for erythematous mucosa in the antrum.  She had a repeat EGD on 11/21/2018 that showed a few minimally oozing( with scope trauma) superficial gastric ulcers with pigmented material were found in the gastric body. The largest lesion was 4 mm in largest dimension.  She underwent another  upper endoscopy on 02/27/2019 which showed patchy mildly friable mucosa with contact bleeding was found in the gastric fundus, in the gastric body and in the gastric antrum.  The patient is maintained on warfarin due to A. fib and a mechanical aortic valve.  Review of the patient's chart shows that she has been anemic dating back to at least December thousand 12 (these are the earliest labs available to me).  Her highest hemoglobin was 11.6 in December 2012.  I also note that the patient has had intermittent mild leukopenia adding back to at least April 2019.  Her lowest white blood cell count was 2.6 in April 2019.  Since admission, the patient has received 1 unit of packed red blood cells.  She is currently on heparin for her procedure with plan to bridge back to Coumadin.  Stool for occult blood was negative x1 on 02/24/2019.  Hematology was asked see the patient to make recommendations regarding her anemia.  [name] subsequent history is as detailed below.   PAST MEDICAL HISTORY: Past Medical History:  Diagnosis Date  . Aortic atherosclerosis ([facility]) 01/23/2018  . Atrial fibrillation ([facility])   . Carotid artery disease ([facility])   . Cholelithiasis 01/23/2018  . Chronic anticoagulation   . Coronary artery disease    status post coronary artery bypass grafting times 07/10/2004  . Diabetes mellitus   . Hypercholesteremia   . Hypertension   . Mechanical heart valve present    H. aortic valve replacement at the time of bypass surgery October 2005  . Moderate to severe pulmonary hypertension ([facility])   . Peripheral arterial disease ([facility])    history of left common iliac artery PTA and stenting for a chronic total occlusion 08/26/01    PAST SURGICAL HISTORY: Past Surgical History:  Procedure Laterality Date  . AORTIC VALVE REPLACEMENT    . CARDIAC CATHETERIZATION  11/10/2004   40% right common illiac, 70% in stent restenosis of distal left common illiac,   . CARDIAC CATHETERIZATION  05/18/2004    LAD 50-70% midstenosis, RCA dominant w/50% stenosis, 50% Right common Illiac artery ostial stenosis, 90% in stent restenosis within midportion of left common illiac stent  . Carotid Duplex  03/12/2012   RSA-elev. velocities suggestive of a 50-69% diameter reduction, Right&Left Bulb/Prox ICA-mild-mod.fibrous plaqueelevating Velocities abnormal study.  . CHOLECYSTECTOMY N/A 03/01/2018   Procedure: LAPAROSCOPIC CHOLECYSTECTOMY;  Surgeon: Judeth Horn, MD;  Location: [facility];  Service: General;  Laterality: N/A;  . COLONOSCOPY WITH PROPOFOL N/A 01/22/2018   Procedure: COLONOSCOPY WITH PROPOFOL;  Surgeon: Wilford Corner, MD;  Location: [facility];  Service: Endoscopy;  Laterality: N/A;  . ESOPHAGOGASTRODUODENOSCOPY (EGD) WITH PROPOFOL N/A 01/22/2018   Procedure: ESOPHAGOGASTRODUODENOSCOPY (EGD) WITH PROPOFOL;  Surgeon: Wilford Corner, MD;  Location: [facility];  Service: Endoscopy;  Laterality: N/A;  . ESOPHAGOGASTRODUODENOSCOPY (EGD) WITH PROPOFOL N/A 11/21/2018   Procedure: ESOPHAGOGASTRODUODENOSCOPY (EGD) WITH PROPOFOL;  Surgeon: [name], MD;  Location: [facility];  Service: Gastroenterology;  Laterality: N/A;  . ESOPHAGOGASTRODUODENOSCOPY (EGD) WITH PROPOFOL Left 02/27/2019  Procedure: ESOPHAGOGASTRODUODENOSCOPY (EGD) WITH PROPOFOL;  Surgeon: Arta Silence, MD;  Location: [facility] ENDOSCOPY;  Service: Endoscopy;  Laterality: Left;  . GIVENS CAPSULE STUDY N/A 11/21/2018   Procedure: GIVENS CAPSULE STUDY;  Surgeon: [name], MD;  Location: [facility];  Service: Gastroenterology;  Laterality: N/A;  To be deployed during EGD  . Lower Ext. Duplex  03/12/2012   Right Proximal CIA- vessel narrowing w/elevated velocities 0-49% diameter reduction. Right SFA-mild mixed density plaque throughout vessel.  [name] NM MYOCAR PERF WALL MOTION  05/19/2010   protocol: Persantine, post stress EF 65%, negative for ischemia, low risk scan  . RIGHT HEART CATH N/A 06/27/2018   Procedure: RIGHT HEART CATH;  Surgeon:  [name], MD;  Location: [facility];  Service: Cardiovascular;  Laterality: N/A;  . TRANSTHORACIC ECHOCARDIOGRAM  08/29/2012   Moderately calcified annulus of mitral valve, moderate regurg. of both mitral valve and tricuspid valve.     FAMILY HISTORY: Family History  Problem Relation Age of Onset  . Breast cancer Neg Hx     GYNECOLOGIC HISTORY:  No LMP recorded. Patient has had a hysterectomy. Menarche:  years old Age at first live birth:  [place] P: 3 LMP:  Contraceptive:  HRT:   Hysterectomy?: yes BSO?:    SOCIAL HISTORY: (Current as of 03/07/2019) [place] is widowed.  She lives by herself.  She has 3 children that live locally.  She smoked three quarters of a pack of cigarettes daily for 50 years.  She quit in 2005.  Denies alcohol use.   ADVANCED DIRECTIVES: Not in place    HEALTH MAINTENANCE: Social History   Tobacco Use  . Smoking status: Former Smoker    Packs/day: 1.00    Years: 30.00    Pack years: 30.00    Types: Cigarettes  . Smokeless tobacco: Never Used  . Tobacco comment: quit smoking 2005  Substance Use Topics  . Alcohol use: No    Alcohol/week: 0.0 standard drinks  . Drug use: No    Colonoscopy: EGD on 02/27/2019 under Dr. Paulita Fujita found some friable gastric mucosa.  Colonoscopy under Dr. Michail Sermon 01/22/2018 showed significant diverticular disease  PAP:   Bone density: May 2017 showed osteopenia Mammography: 02/25/2017, breast density category B.  Allergies  Allergen Reactions  . Flagyl [Metronidazole] Rash    ALL-OVER BODY RASH  . Coreg [Carvedilol] Other (See Comments)    Terrible cramping in the feet and had a lot of bowel movements, but not diarrhea  . Losartan Swelling    Patient doesn't recall site of swelling  . Verapamil Hives  . Zetia [Ezetimibe] Other (See Comments)    Reaction not recalled  . Zocor [Simvastatin - High Dose] Other (See Comments)    Reaction not recalled    Current Outpatient Medications  Medication  Sig Dispense Refill  . acetaminophen (TYLENOL) 325 MG tablet Take 650 mg by mouth every 6 (six) hours as needed for mild pain.     [name] allopurinol (ZYLOPRIM) 100 MG tablet Take 100 mg by mouth daily.    [name] ALPRAZolam (XANAX) 0.5 MG tablet Take 0.25 mg by mouth at bedtime.     [name] amLODipine (NORVASC) 10 MG tablet Take 1 tablet (10 mg total) by mouth daily. 90 tablet 3  . atorvastatin (LIPITOR) 80 MG tablet Take 1 tablet (80 mg total) by mouth daily at 6 PM.    . Cholecalciferol (VITAMIN D3) 1000 UNITS CAPS Take 2,000 Units by mouth daily with lunch.     [name]  cloNIDine (CATAPRES) 0.2 MG tablet Take 1 tablet (0.2 mg total) by mouth 2 (two) times daily. 60 tablet 3  . colchicine 0.6 MG tablet     . Cyanocobalamin (VITAMIN B12) 1000 MCG TBCR Take 1,000 mcg by mouth daily with lunch.     . furosemide (LASIX) 40 MG tablet Take 40 mg by mouth 2 (two) times daily.    . hydrALAZINE (APRESOLINE) 100 MG tablet TAKE 1 TABLET THREE TIMES DAILY 270 tablet 3  . lactose free nutrition (BOOST) LIQD Take 237 mLs by mouth daily. (To supplement meals)    . magnesium oxide (MAG-OX) 400 (241.3 Mg) MG tablet Take 1 tablet (400 mg total) by mouth daily.    . metFORMIN (GLUCOPHAGE) 500 MG tablet Take 500 mg by mouth 2 (two) times daily with a meal.     . metoprolol tartrate (LOPRESSOR) 25 MG tablet Take 0.5 tablets (12.5 mg total) by mouth daily. 45 tablet 3  . pantoprazole (PROTONIX) 40 MG tablet Take 40 mg by mouth 2 (two) times daily.     [name] spironolactone (ALDACTONE) 25 MG tablet Take 1 tablet (25 mg total) by mouth daily. 90 tablet 3  . tadalafil, PAH, (ADCIRCA) 20 MG tablet Take 2 tablets (40 mg total) by mouth daily. 60 tablet 6  . vitamin C (ASCORBIC ACID) 500 MG tablet Take 500 mg by mouth daily with lunch.    . warfarin (COUMADIN) 5 MG tablet TAKE 1.5 TO 2 TABLETS BY MOUTH DAILY AS DIRECTED BY COUMADIN CLINIC 180 tablet 0   No current facility-administered medications for this visit.   Facility-Administered  Medications Ordered in Other Visits  Medication Dose Route Frequency Provider Last Rate Last Admin  . epoetin alfa-epbx (RETACRIT) injection 50,000 Units  50,000 Units Subcutaneous Once Ronie Barnhart, [name], MD      . riTUXimab (RITUXAN) 600 mg in sodium chloride 0.9 % 190 mL infusion  375 mg/m2 (Treatment Plan Recorded) Intravenous Once Halen Mossbarger, [name], MD        OBJECTIVE:  white woman using a cane Vitals:   02/02/20 1052  BP: (!) 140/50  Pulse: (!) 51  Resp: 17  SpO2: 100%   Wt Readings from Last 3 Encounters:  02/02/20 123 lb 9.6 oz (56.1 kg)  01/05/20 124 lb 4 oz (56.4 kg)  12/25/19 121 lb (54.9 kg)   Body mass index is 22.61 kg/m.    ECOG FS:2 - Symptomatic, <50% confined to bed  Sclerae unicteric, EOMs intact Wearing a mask No cervical or supraclavicular adenopathy Lungs no rales or rhonchi Heart regular rate and rhythm Abd soft, nontender, positive bowel sounds MSK no focal spinal tenderness, no upper extremity lymphedema Neuro: nonfocal, well oriented, appropriate affect Breasts: Deferred  LAB RESULTS:  CMP     Component Value Date/Time   NA 135 02/02/2020 1026   NA 137 12/06/2017 1436   K 4.2 02/02/2020 1026   CL 103 02/02/2020 1026   CO2 19 (L) 02/02/2020 1026   GLUCOSE 156 (H) 02/02/2020 1026   BUN 79 (H) 02/02/2020 1026   BUN 20 12/06/2017 1436   CREATININE 1.85 (H) 02/02/2020 1026   CALCIUM 9.3 02/02/2020 1026   PROT 6.5 02/02/2020 1026   ALBUMIN 3.6 02/02/2020 1026   AST 24 02/02/2020 1026   ALT 13 02/02/2020 1026   ALKPHOS 79 02/02/2020 1026   BILITOT 0.4 02/02/2020 1026   GFRNONAA 25 (L) 02/02/2020 1026   GFRAA 29 (L) 02/02/2020 1026    Lab Results  Component Value  Date   TOTALPROTELP 6.9 05/07/2019    No results found for: Nils Pyle, [facility]  Lab Results  Component Value Date   WBC 4.9 02/02/2020   NEUTROABS 3.2 02/02/2020   HGB 9.8 (L) 02/02/2020   HCT 29.5 (L) 02/02/2020   MCV 93.7 02/02/2020   PLT 150  02/02/2020    No results found for: LABCA2  No components found for: PRFFMB846  No results for input(s): INR in the last 168 hours.  No results found for: LABCA2  No results found for: KZL935  No results found for: TSV779  No results found for: TJQ300  No results found for: CA2729  No components found for: HGQUANT  No results found for: CEA1 / No results found for: CEA1   No results found for: AFPTUMOR  No results found for: CHROMOGRNA  No results found for: HGBA, HGBA2QUANT, HGBFQUANT, HGBSQUAN (Hemoglobinopathy evaluation)   Lab Results  Component Value Date   LDH 212 (H) 08/04/2019    Lab Results  Component Value Date   IRON 109 01/05/2020   TIBC 261 01/05/2020   IRONPCTSAT 42 01/05/2020   (Iron and TIBC)  Lab Results  Component Value Date   FERRITIN 193 01/05/2020    Urinalysis    Component Value Date/Time   COLORURINE YELLOW 07/07/2019 0209   APPEARANCEUR CLOUDY (A) 07/07/2019 0209   LABSPEC 1.011 07/07/2019 0209   PHURINE 6.0 07/07/2019 0209   GLUCOSEU NEGATIVE 07/07/2019 0209   HGBUR NEGATIVE 07/07/2019 0209   BILIRUBINUR NEGATIVE 07/07/2019 0209   KETONESUR NEGATIVE 07/07/2019 0209   PROTEINUR >=300 (A) 07/07/2019 0209   UROBILINOGEN 1.0 10/06/2011 1709   NITRITE NEGATIVE 07/07/2019 0209   LEUKOCYTESUR LARGE (A) 07/07/2019 0209    STUDIES:  No results found.   ELIGIBLE FOR AVAILABLE RESEARCH PROTOCOL: no   ASSESSMENT: 79 y.o.  woman with multifactorial anemia, as follows:             (a) anemia of chronic inflammation: As of May 2020 she has a SED rate of 95, CRP 1.1, positive ANA and RF; subsequently she was found to be ANCA positive             (b) hemolysis (mild): she has a mildly elevated LDH and a DAT positive for complement, not IgG; this is c/w (a) above             (c) anemia of renal insufficiency: inadequate EPO resonse to anemia              (d) GIB/ iron deficiency in patient on lifelong anticoagulaton  (1)  Feraheme received 03/01/2019, repeated 03/20/2019   (a) reticulocyte 03/07/2019 up from 43.4 a year ago to 191.8 after iron infusion  (b) feraheme repeated on 05/12/2019  (c) subsequent ferritin levels >100  (2) darbepoetin: starting 05/05/2019 at 175mcg given every 2 weeks  (a) dose increased to 200 mcg every week starting 07/01/2019   (b) back to every 2 weeks beginning 08/04/2019 still at 200 mcg dose  (3) ANCA positive vasculitis: diagnosed 05/08/2019 (titer 1:640)  (a) prednisone 40 mg daily started 05/31/2019  (b) rituximab weekly started 06/12/2019 (received test dose 06/05/2019)   (i) hepatitis B sAg, core Ab and HIV negative 05/08/2019   (ii) rituximab held after 07/14/2019 dose (received 5 weekly doses) with neutropenia   (iii) rituximab resumed 08/18/2019 but changed to monthly   (iv) rituximab changed to every 2 months beginning with 02/02/2020 dose  (c) prednisone tapered to off as of 09/15/2019  (  4) osteopenia with a T score of -1.5 on bone density 02/28/2016 at the [facility] center    PLAN: [name] is doing very well clinically.  Her functional status continues to slowly improve and now she is walking much better on her own, using a cane, and even doing a little bit of work.  She would like to do more sewing.  Her reticulocyte count on the current dose of Retacrit is suboptimal and the hemoglobin is below target.  I am upping the Retacrit dose to 50,000 every 4weeks.  We will continue to follow the hemoglobin, the goal is for hemoglobin between 10 and 11.  She has tolerated going on monthly rituximab without any worsening of her vasculitis symptoms.  I am going to try to stretch that to 2 months and if she does well with that we can go to 3 months afterwards.  She will return next month for the Retacrit only and then in 2 months for both drugs and she will see me at that time  She knows to call for any other issue that may develop before then  Total encounter time 25  minutes.Sarajane Jews C. Namiah Dunnavant, MD  02/02/20 11:55 AM Medical Oncology and Hematology [facility] [place], [place] [address] Tel. [phone]    Fax. [phone]   I, Wilburn Mylar, am acting as scribe for Dr. [name]. Cord Wilczynski.  I, Lurline Del MD, have reviewed the above documentation for accuracy and completeness, and I agree with the above.   *Total Encounter Time as defined by the Centers for Medicare and Medicaid Services includes, in addition to the face-to-face time of a patient visit (documented in the note above) non-face-to-face time: obtaining and reviewing outside history, ordering and reviewing medications, tests or procedures, care coordination (communications with other health care professionals or caregivers) and documentation in the medical record.

## 2020-02-02 ENCOUNTER — Inpatient Hospital Stay: Payer: Medicare HMO

## 2020-02-02 ENCOUNTER — Inpatient Hospital Stay: Payer: Medicare HMO | Admitting: Oncology

## 2020-02-02 ENCOUNTER — Inpatient Hospital Stay: Payer: Medicare HMO | Attending: Oncology

## 2020-02-02 ENCOUNTER — Other Ambulatory Visit: Payer: Self-pay

## 2020-02-02 VITALS — BP 155/50 | HR 50 | Temp 97.7°F | Resp 18

## 2020-02-02 VITALS — BP 140/50 | HR 51 | Resp 17 | Ht 62.0 in | Wt 123.6 lb

## 2020-02-02 DIAGNOSIS — N189 Chronic kidney disease, unspecified: Secondary | ICD-10-CM | POA: Insufficient documentation

## 2020-02-02 DIAGNOSIS — D61818 Other pancytopenia: Secondary | ICD-10-CM

## 2020-02-02 DIAGNOSIS — M8580 Other specified disorders of bone density and structure, unspecified site: Secondary | ICD-10-CM | POA: Insufficient documentation

## 2020-02-02 DIAGNOSIS — I739 Peripheral vascular disease, unspecified: Secondary | ICD-10-CM | POA: Diagnosis not present

## 2020-02-02 DIAGNOSIS — I272 Pulmonary hypertension, unspecified: Secondary | ICD-10-CM

## 2020-02-02 DIAGNOSIS — D500 Iron deficiency anemia secondary to blood loss (chronic): Secondary | ICD-10-CM

## 2020-02-02 DIAGNOSIS — N289 Disorder of kidney and ureter, unspecified: Secondary | ICD-10-CM | POA: Diagnosis not present

## 2020-02-02 DIAGNOSIS — D594 Other nonautoimmune hemolytic anemias: Secondary | ICD-10-CM

## 2020-02-02 DIAGNOSIS — D631 Anemia in chronic kidney disease: Secondary | ICD-10-CM

## 2020-02-02 DIAGNOSIS — Z5112 Encounter for antineoplastic immunotherapy: Secondary | ICD-10-CM | POA: Diagnosis not present

## 2020-02-02 DIAGNOSIS — I776 Arteritis, unspecified: Secondary | ICD-10-CM | POA: Diagnosis not present

## 2020-02-02 DIAGNOSIS — D696 Thrombocytopenia, unspecified: Secondary | ICD-10-CM | POA: Diagnosis not present

## 2020-02-02 DIAGNOSIS — N184 Chronic kidney disease, stage 4 (severe): Secondary | ICD-10-CM | POA: Diagnosis not present

## 2020-02-02 DIAGNOSIS — Z952 Presence of prosthetic heart valve: Secondary | ICD-10-CM

## 2020-02-02 DIAGNOSIS — N183 Chronic kidney disease, stage 3 unspecified: Secondary | ICD-10-CM

## 2020-02-02 DIAGNOSIS — I7782 Antineutrophilic cytoplasmic antibody (ANCA) vasculitis: Secondary | ICD-10-CM

## 2020-02-02 DIAGNOSIS — D508 Other iron deficiency anemias: Secondary | ICD-10-CM

## 2020-02-02 LAB — CMP (CANCER CENTER ONLY)
ALT: 13 U/L (ref 0–44)
AST: 24 U/L (ref 15–41)
Albumin: 3.6 g/dL (ref 3.5–5.0)
Alkaline Phosphatase: 79 U/L (ref 38–126)
Anion gap: 13 (ref 5–15)
BUN: 79 mg/dL — ABNORMAL HIGH (ref 8–23)
CO2: 19 mmol/L — ABNORMAL LOW (ref 22–32)
Calcium: 9.3 mg/dL (ref 8.9–10.3)
Chloride: 103 mmol/L (ref 98–111)
Creatinine: 1.85 mg/dL — ABNORMAL HIGH (ref 0.44–1.00)
GFR, Est AFR Am: 29 mL/min — ABNORMAL LOW (ref >60)
GFR, Estimated: 25 mL/min — ABNORMAL LOW (ref >60)
Glucose, Bld: 156 mg/dL — ABNORMAL HIGH (ref 70–99)
Potassium: 4.2 mmol/L (ref 3.5–5.1)
Sodium: 135 mmol/L (ref 135–145)
Total Bilirubin: 0.4 mg/dL (ref 0.3–1.2)
Total Protein: 6.5 g/dL (ref 6.5–8.1)

## 2020-02-02 LAB — IRON AND TIBC
Iron: 85 ug/dL (ref 41–142)
Saturation Ratios: 33 % (ref 21–57)
TIBC: 261 ug/dL (ref 236–444)
UIBC: 176 ug/dL (ref 120–384)

## 2020-02-02 LAB — CBC WITH DIFFERENTIAL/PLATELET
Abs Immature Granulocytes: 0.01 10*3/uL (ref 0.00–0.07)
Basophils Absolute: 0.1 10*3/uL (ref 0.0–0.1)
Basophils Relative: 1 %
Eosinophils Absolute: 0.2 10*3/uL (ref 0.0–0.5)
Eosinophils Relative: 3 %
HCT: 29.5 % — ABNORMAL LOW (ref 36.0–46.0)
Hemoglobin: 9.8 g/dL — ABNORMAL LOW (ref 12.0–15.0)
Immature Granulocytes: 0 %
Lymphocytes Relative: 19 %
Lymphs Abs: 0.9 10*3/uL (ref 0.7–4.0)
MCH: 31.1 pg (ref 26.0–34.0)
MCHC: 33.2 g/dL (ref 30.0–36.0)
MCV: 93.7 fL (ref 80.0–100.0)
Monocytes Absolute: 0.6 10*3/uL (ref 0.1–1.0)
Monocytes Relative: 12 %
Neutro Abs: 3.2 10*3/uL (ref 1.7–7.7)
Neutrophils Relative %: 65 %
Platelets: 150 10*3/uL (ref 150–400)
RBC: 3.15 MIL/uL — ABNORMAL LOW (ref 3.87–5.11)
RDW: 15.1 % (ref 11.5–15.5)
WBC: 4.9 10*3/uL (ref 4.0–10.5)
nRBC: 0 % (ref 0.0–0.2)

## 2020-02-02 LAB — RETICULOCYTES
Immature Retic Fract: 3.6 % (ref 2.3–15.9)
RBC.: 3.14 MIL/uL — ABNORMAL LOW (ref 3.87–5.11)
Retic Count, Absolute: 49 10*3/uL (ref 19.0–186.0)
Retic Ct Pct: 1.6 % (ref 0.4–3.1)

## 2020-02-02 LAB — SAVE SMEAR(SSMR), FOR PROVIDER SLIDE REVIEW

## 2020-02-02 LAB — FERRITIN: Ferritin: 185 ng/mL (ref 11–307)

## 2020-02-02 MED ORDER — EPOETIN ALFA-EPBX 40000 UNIT/ML IJ SOLN
40000.0000 [IU] | Freq: Once | INTRAMUSCULAR | Status: AC
  Administered 2020-02-02: 12:00:00 40000 [IU] via SUBCUTANEOUS

## 2020-02-02 MED ORDER — DIPHENHYDRAMINE HCL 25 MG PO CAPS
25.0000 mg | ORAL_CAPSULE | Freq: Once | ORAL | Status: AC
  Administered 2020-02-02: 25 mg via ORAL

## 2020-02-02 MED ORDER — EPOETIN ALFA-EPBX 40000 UNIT/ML IJ SOLN: 50000.0000 [IU] | Freq: Once | INTRAMUSCULAR | Status: DC

## 2020-02-02 MED ORDER — ACETAMINOPHEN 325 MG PO TABS
ORAL_TABLET | ORAL | Status: AC
  Filled 2020-02-02: qty 2

## 2020-02-02 MED ORDER — EPOETIN ALFA-EPBX 40000 UNIT/ML IJ SOLN
INTRAMUSCULAR | Status: AC
  Filled 2020-02-02: qty 1

## 2020-02-02 MED ORDER — SODIUM CHLORIDE 0.9 % IV SOLN
375.0000 mg/m2 | Freq: Once | INTRAVENOUS | Status: AC
  Administered 2020-02-02: 600 mg via INTRAVENOUS
  Filled 2020-02-02: qty 50

## 2020-02-02 MED ORDER — EPOETIN ALFA-EPBX 10000 UNIT/ML IJ SOLN
INTRAMUSCULAR | Status: AC
  Filled 2020-02-02: qty 1

## 2020-02-02 MED ORDER — EPOETIN ALFA-EPBX 10000 UNIT/ML IJ SOLN
10000.0000 [IU] | Freq: Once | INTRAMUSCULAR | Status: AC
  Administered 2020-02-02: 10000 [IU] via SUBCUTANEOUS

## 2020-02-02 MED ORDER — DIPHENHYDRAMINE HCL 25 MG PO CAPS
ORAL_CAPSULE | ORAL | Status: AC
  Filled 2020-02-02: qty 1

## 2020-02-02 MED ORDER — ACETAMINOPHEN 325 MG PO TABS
650.0000 mg | ORAL_TABLET | Freq: Once | ORAL | Status: AC
  Administered 2020-02-02: 650 mg via ORAL

## 2020-02-02 MED ORDER — SODIUM CHLORIDE 0.9 % IV SOLN
Freq: Once | INTRAVENOUS | Status: AC
  Filled 2020-02-02: qty 250

## 2020-02-02 NOTE — Patient Instructions (Signed)
[facility] °Discharge Instructions for Patients Receiving Chemotherapy ° °Today you received the following chemotherapy agents: Rituximab ° °To help prevent nausea and vomiting after your treatment, we encourage you to take your nausea medication as directed.  °  °If you develop nausea and vomiting that is not controlled by your nausea medication, call the clinic.  ° °BELOW ARE SYMPTOMS THAT SHOULD BE REPORTED IMMEDIATELY: °· *FEVER GREATER THAN 100.5 F °· *CHILLS WITH OR WITHOUT FEVER °· NAUSEA AND VOMITING THAT IS NOT CONTROLLED WITH YOUR NAUSEA MEDICATION °· *UNUSUAL SHORTNESS OF BREATH °· *UNUSUAL BRUISING OR BLEEDING °· TENDERNESS IN MOUTH AND THROAT WITH OR WITHOUT PRESENCE OF ULCERS °· *URINARY PROBLEMS °· *BOWEL PROBLEMS °· UNUSUAL RASH °Items with * indicate a potential emergency and should be followed up as soon as possible. ° °Feel free to call the clinic should you have any questions or concerns. The clinic phone number is [phone]. ° °Please show the CHEMO ALERT CARD at check-in to the Emergency Department and triage nurse. ° ° °

## 2020-02-03 ENCOUNTER — Telehealth: Payer: Self-pay | Admitting: Oncology

## 2020-02-03 NOTE — Telephone Encounter (Signed)
Scheduled appts per 4/26 los. Called both numbers on file, no answer and was not able to leave a voicemail. Pt to get updated appt calendar at next visit if needed per appt notes.

## 2020-02-04 ENCOUNTER — Other Ambulatory Visit: Payer: Medicare HMO

## 2020-02-05 ENCOUNTER — Other Ambulatory Visit: Payer: Self-pay

## 2020-02-05 ENCOUNTER — Ambulatory Visit (HOSPITAL_COMMUNITY): Payer: Medicare HMO | Attending: Cardiovascular Disease

## 2020-02-05 DIAGNOSIS — Z952 Presence of prosthetic heart valve: Secondary | ICD-10-CM

## 2020-02-06 ENCOUNTER — Telehealth: Payer: Self-pay

## 2020-02-06 DIAGNOSIS — I480 Paroxysmal atrial fibrillation: Secondary | ICD-10-CM

## 2020-02-06 DIAGNOSIS — Z952 Presence of prosthetic heart valve: Secondary | ICD-10-CM

## 2020-02-06 DIAGNOSIS — I5033 Acute on chronic diastolic (congestive) heart failure: Secondary | ICD-10-CM

## 2020-02-06 NOTE — Progress Notes (Signed)
Counseling intern (CI) spoke to patient on 02/06/2020 for a telehealth counseling session via phone. Counseling intern provided space for pt to open up about experiences and responded with empathy, compassion, and normalization of feelings.   Pt reported she is not feeling well today as a result of her echocardiogram appointment yesterday. Pt stated that she drove herself to this appointment and this took a lot out of her as evidenced by her going right to bed when she got home. Pt shared that her doctor explained that she needs to stay on her tx for now, but agreed to change the schedule so one tx is every 8 weeks but the injection for her red blood cell count is still every 4 weeks. Pt and CI then  discussed her present need to depend on others. Pt reported she did make a list of what she needs help with around her house as we discussed previously and that she has already broached the subject of monthly help with chores with one daughter. CI encouraged her to continue this conversation and to engage her other two children as well. Pt and CI also discussed the impact of losing her husband and how being single in old age is extremely isolating. She expressed that she would enjoy having a female companion to talk to on the phone and see from time to time, but does not feel she would want a commitment. Pt shared that she has begun doing small sewing projects again and CI expressed excitement for her because her physical inability to sew was a huge loss for the pt. She hopes that getting back to doing small sewing jobs will also provide social interaction when clients come by her house to drop off and pick up items. Pt shared that she prays that she does not have to experience any more tragedy during her life and CI discussed how grief is a life long process. Pt also shared that she hopes to stop looking back to how things "used to be" in a way that consttantly compares because she recognized it only makes her feel sad  and irritated. CI discussed the concept of acceptance and how this must be practiced one moment at time by building awareness, emotional flexibility, and shifting perspectives. Pt expressed that she is going to be OK and that she was grateful for the counseling intern's support.  CI helped pt process emotions related to her cancer experience and CI reminded pt this is her last week at [facility] and that this is the pt's last counseling appointment. CI also used psychoeducation to discuss grief and acceptance.  Art Buff [place] Counseling Intern Voicemail:  [phone]

## 2020-02-06 NOTE — Telephone Encounter (Signed)
Called patient left echo results on personal voice mail.Advised to repeat in 12 months.

## 2020-02-13 ENCOUNTER — Other Ambulatory Visit (HOSPITAL_COMMUNITY): Payer: Self-pay | Admitting: Cardiovascular Disease

## 2020-02-13 ENCOUNTER — Other Ambulatory Visit: Payer: Self-pay

## 2020-02-13 ENCOUNTER — Ambulatory Visit (HOSPITAL_COMMUNITY)
Admission: RE | Admit: 2020-02-13 | Discharge: 2020-02-13 | Disposition: A | Discharge status: Home / Self Care | Payer: Medicare HMO | Source: Ambulatory Visit | Attending: Cardiology | Admitting: Cardiology

## 2020-02-13 ENCOUNTER — Ambulatory Visit (INDEPENDENT_AMBULATORY_CARE_PROVIDER_SITE_OTHER): Payer: Medicare HMO | Admitting: Pharmacist

## 2020-02-13 DIAGNOSIS — Z7901 Long term (current) use of anticoagulants: Secondary | ICD-10-CM

## 2020-02-13 DIAGNOSIS — I779 Disorder of arteries and arterioles, unspecified: Secondary | ICD-10-CM | POA: Insufficient documentation

## 2020-02-13 DIAGNOSIS — I6523 Occlusion and stenosis of bilateral carotid arteries: Secondary | ICD-10-CM

## 2020-02-13 DIAGNOSIS — Z952 Presence of prosthetic heart valve: Secondary | ICD-10-CM

## 2020-02-13 LAB — POCT INR: INR: 2.7 (ref 2.0–3.0)

## 2020-02-13 NOTE — Progress Notes (Signed)
carotid 

## 2020-02-20 ENCOUNTER — Telehealth: Payer: Self-pay | Admitting: Cardiovascular Disease

## 2020-02-20 DIAGNOSIS — M545 Low back pain, unspecified: Secondary | ICD-10-CM | POA: Insufficient documentation

## 2020-02-20 DIAGNOSIS — N390 Urinary tract infection, site not specified: Secondary | ICD-10-CM | POA: Diagnosis not present

## 2020-02-20 DIAGNOSIS — R350 Frequency of micturition: Secondary | ICD-10-CM | POA: Diagnosis not present

## 2020-02-20 DIAGNOSIS — I700 Atherosclerosis of aorta: Secondary | ICD-10-CM

## 2020-02-20 DIAGNOSIS — I739 Peripheral vascular disease, unspecified: Secondary | ICD-10-CM

## 2020-02-20 NOTE — Telephone Encounter (Addendum)
Spoke to patient . Results given of carotid doppler.  voiced understanding.  patient asked if she need to have doppler done for lower extremities this year, She states she did not do them last year because she was in the hospital .   RN reviewed patient chart . Patient did have   ABI's with / without TBI and  Aorta/IVC/ILIAC  ON 04/29/18.   IN THE REPORT IT SAYS REPEAT 12 MONTHS . ( NOT SCHEDULED)   Patient aware will defer to Dr [name] to see if he would like to have test done this year.  and left patient know the answer  Patient would like to have dopplers don eo 03/25/20 if possible she already has an  appointment to have INR checked that day in th office at [facility]

## 2020-02-20 NOTE — Telephone Encounter (Signed)
Yes, please repeat LEA s

## 2020-02-20 NOTE — Telephone Encounter (Signed)
New message   Patient states that she is returning a call. Please advise.

## 2020-02-23 NOTE — Telephone Encounter (Signed)
Order placed for upcoming dopplers -- abi with /without tbi  , aorta/ivc/iliac Scheduler is aware the patient would like to have test done a certain day.   attempt to call patient to inform . Phone busy will  Try again.

## 2020-02-24 NOTE — Telephone Encounter (Signed)
Patient aware . Scheduler will be calling to set up appointment for vascular testing.

## 2020-02-25 ENCOUNTER — Telehealth: Payer: Self-pay | Admitting: Adult Health

## 2020-02-25 NOTE — Telephone Encounter (Signed)
Changed 5/24 appt per 5/18 sch message- pt aware of changes.

## 2020-02-27 ENCOUNTER — Telehealth: Payer: Self-pay

## 2020-02-27 DIAGNOSIS — I779 Disorder of arteries and arterioles, unspecified: Secondary | ICD-10-CM

## 2020-02-27 NOTE — Telephone Encounter (Signed)
Called patient left carotid doppler results on personal voice mail.Advised to repeat in 12 months. 

## 2020-03-01 ENCOUNTER — Inpatient Hospital Stay: Payer: Medicare HMO | Attending: Oncology

## 2020-03-01 ENCOUNTER — Inpatient Hospital Stay: Payer: Medicare HMO

## 2020-03-01 ENCOUNTER — Other Ambulatory Visit: Payer: Self-pay

## 2020-03-01 VITALS — BP 152/64 | HR 54 | Temp 96.8°F | Resp 16

## 2020-03-01 DIAGNOSIS — I776 Arteritis, unspecified: Secondary | ICD-10-CM | POA: Insufficient documentation

## 2020-03-01 DIAGNOSIS — D594 Other nonautoimmune hemolytic anemias: Secondary | ICD-10-CM

## 2020-03-01 DIAGNOSIS — N184 Chronic kidney disease, stage 4 (severe): Secondary | ICD-10-CM | POA: Diagnosis not present

## 2020-03-01 DIAGNOSIS — I739 Peripheral vascular disease, unspecified: Secondary | ICD-10-CM

## 2020-03-01 DIAGNOSIS — D631 Anemia in chronic kidney disease: Secondary | ICD-10-CM | POA: Insufficient documentation

## 2020-03-01 DIAGNOSIS — D508 Other iron deficiency anemias: Secondary | ICD-10-CM

## 2020-03-01 DIAGNOSIS — D500 Iron deficiency anemia secondary to blood loss (chronic): Secondary | ICD-10-CM

## 2020-03-01 DIAGNOSIS — D696 Thrombocytopenia, unspecified: Secondary | ICD-10-CM

## 2020-03-01 LAB — CBC WITH DIFFERENTIAL/PLATELET
Abs Immature Granulocytes: 0.04 10*3/uL (ref 0.00–0.07)
Basophils Absolute: 0.1 10*3/uL (ref 0.0–0.1)
Basophils Relative: 1 %
Eosinophils Absolute: 0.2 10*3/uL (ref 0.0–0.5)
Eosinophils Relative: 4 %
HCT: 32.3 % — ABNORMAL LOW (ref 36.0–46.0)
Hemoglobin: 10.6 g/dL — ABNORMAL LOW (ref 12.0–15.0)
Immature Granulocytes: 1 %
Lymphocytes Relative: 24 %
Lymphs Abs: 1.1 10*3/uL (ref 0.7–4.0)
MCH: 30.7 pg (ref 26.0–34.0)
MCHC: 32.8 g/dL (ref 30.0–36.0)
MCV: 93.6 fL (ref 80.0–100.0)
Monocytes Absolute: 0.7 10*3/uL (ref 0.1–1.0)
Monocytes Relative: 16 %
Neutro Abs: 2.5 10*3/uL (ref 1.7–7.7)
Neutrophils Relative %: 54 %
Platelets: 179 10*3/uL (ref 150–400)
RBC: 3.45 MIL/uL — ABNORMAL LOW (ref 3.87–5.11)
RDW: 15.3 % (ref 11.5–15.5)
WBC: 4.6 10*3/uL (ref 4.0–10.5)
nRBC: 0 % (ref 0.0–0.2)

## 2020-03-01 LAB — CMP (CANCER CENTER ONLY)
ALT: 16 U/L (ref 0–44)
AST: 28 U/L (ref 15–41)
Albumin: 3.8 g/dL (ref 3.5–5.0)
Alkaline Phosphatase: 89 U/L (ref 38–126)
Anion gap: 9 (ref 5–15)
BUN: 82 mg/dL — ABNORMAL HIGH (ref 8–23)
CO2: 24 mmol/L (ref 22–32)
Calcium: 9.3 mg/dL (ref 8.9–10.3)
Chloride: 100 mmol/L (ref 98–111)
Creatinine: 2.66 mg/dL — ABNORMAL HIGH (ref 0.44–1.00)
GFR, Est AFR Am: 19 mL/min — ABNORMAL LOW (ref >60)
GFR, Estimated: 16 mL/min — ABNORMAL LOW (ref >60)
Glucose, Bld: 162 mg/dL — ABNORMAL HIGH (ref 70–99)
Potassium: 4.4 mmol/L (ref 3.5–5.1)
Sodium: 133 mmol/L — ABNORMAL LOW (ref 135–145)
Total Bilirubin: 0.3 mg/dL (ref 0.3–1.2)
Total Protein: 6.7 g/dL (ref 6.5–8.1)

## 2020-03-01 LAB — RETICULOCYTES
Immature Retic Fract: 2.7 % (ref 2.3–15.9)
RBC.: 3.43 MIL/uL — ABNORMAL LOW (ref 3.87–5.11)
Retic Count, Absolute: 45.6 10*3/uL (ref 19.0–186.0)
Retic Ct Pct: 1.3 % (ref 0.4–3.1)

## 2020-03-01 LAB — IRON AND TIBC
Iron: 154 ug/dL — ABNORMAL HIGH (ref 41–142)
Saturation Ratios: 55 % (ref 21–57)
TIBC: 278 ug/dL (ref 236–444)
UIBC: 124 ug/dL (ref 120–384)

## 2020-03-01 LAB — SAVE SMEAR(SSMR), FOR PROVIDER SLIDE REVIEW

## 2020-03-01 LAB — FERRITIN: Ferritin: 169 ng/mL (ref 11–307)

## 2020-03-01 MED ORDER — EPOETIN ALFA-EPBX 10000 UNIT/ML IJ SOLN
INTRAMUSCULAR | Status: AC
  Filled 2020-03-01: qty 1

## 2020-03-01 MED ORDER — EPOETIN ALFA-EPBX 40000 UNIT/ML IJ SOLN
50000.0000 [IU] | Freq: Once | INTRAMUSCULAR | Status: AC
  Administered 2020-03-01: 50000 [IU] via SUBCUTANEOUS

## 2020-03-01 MED ORDER — EPOETIN ALFA-EPBX 40000 UNIT/ML IJ SOLN
INTRAMUSCULAR | Status: AC
  Filled 2020-03-01: qty 1

## 2020-03-01 NOTE — Patient Instructions (Signed)

## 2020-03-04 ENCOUNTER — Other Ambulatory Visit: Payer: Self-pay

## 2020-03-04 ENCOUNTER — Ambulatory Visit (HOSPITAL_COMMUNITY)
Admission: RE | Admit: 2020-03-04 | Discharge: 2020-03-04 | Disposition: A | Discharge status: Home / Self Care | Payer: Medicare HMO | Source: Ambulatory Visit | Attending: Cardiology | Admitting: Cardiology

## 2020-03-04 ENCOUNTER — Encounter (HOSPITAL_COMMUNITY): Payer: Self-pay | Admitting: Cardiology

## 2020-03-04 VITALS — BP 128/40 | HR 64 | Wt 124.4 lb

## 2020-03-04 DIAGNOSIS — I2510 Atherosclerotic heart disease of native coronary artery without angina pectoris: Secondary | ICD-10-CM | POA: Diagnosis not present

## 2020-03-04 DIAGNOSIS — Z87440 Personal history of urinary (tract) infections: Secondary | ICD-10-CM | POA: Insufficient documentation

## 2020-03-04 DIAGNOSIS — Z8711 Personal history of peptic ulcer disease: Secondary | ICD-10-CM | POA: Insufficient documentation

## 2020-03-04 DIAGNOSIS — I4510 Unspecified right bundle-branch block: Secondary | ICD-10-CM | POA: Insufficient documentation

## 2020-03-04 DIAGNOSIS — I482 Chronic atrial fibrillation, unspecified: Secondary | ICD-10-CM | POA: Insufficient documentation

## 2020-03-04 DIAGNOSIS — E1151 Type 2 diabetes mellitus with diabetic peripheral angiopathy without gangrene: Secondary | ICD-10-CM | POA: Insufficient documentation

## 2020-03-04 DIAGNOSIS — I739 Peripheral vascular disease, unspecified: Secondary | ICD-10-CM | POA: Diagnosis not present

## 2020-03-04 DIAGNOSIS — Z79899 Other long term (current) drug therapy: Secondary | ICD-10-CM | POA: Diagnosis not present

## 2020-03-04 DIAGNOSIS — I130 Hypertensive heart and chronic kidney disease with heart failure and stage 1 through stage 4 chronic kidney disease, or unspecified chronic kidney disease: Secondary | ICD-10-CM | POA: Insufficient documentation

## 2020-03-04 DIAGNOSIS — I480 Paroxysmal atrial fibrillation: Secondary | ICD-10-CM

## 2020-03-04 DIAGNOSIS — Z952 Presence of prosthetic heart valve: Secondary | ICD-10-CM | POA: Diagnosis not present

## 2020-03-04 DIAGNOSIS — M109 Gout, unspecified: Secondary | ICD-10-CM | POA: Diagnosis not present

## 2020-03-04 DIAGNOSIS — I272 Pulmonary hypertension, unspecified: Secondary | ICD-10-CM | POA: Insufficient documentation

## 2020-03-04 DIAGNOSIS — Z951 Presence of aortocoronary bypass graft: Secondary | ICD-10-CM | POA: Diagnosis not present

## 2020-03-04 DIAGNOSIS — I5032 Chronic diastolic (congestive) heart failure: Secondary | ICD-10-CM | POA: Diagnosis not present

## 2020-03-04 DIAGNOSIS — Z87891 Personal history of nicotine dependence: Secondary | ICD-10-CM | POA: Insufficient documentation

## 2020-03-04 DIAGNOSIS — E785 Hyperlipidemia, unspecified: Secondary | ICD-10-CM | POA: Insufficient documentation

## 2020-03-04 DIAGNOSIS — Z7984 Long term (current) use of oral hypoglycemic drugs: Secondary | ICD-10-CM | POA: Diagnosis not present

## 2020-03-04 DIAGNOSIS — E1122 Type 2 diabetes mellitus with diabetic chronic kidney disease: Secondary | ICD-10-CM | POA: Diagnosis not present

## 2020-03-04 DIAGNOSIS — Z7901 Long term (current) use of anticoagulants: Secondary | ICD-10-CM | POA: Insufficient documentation

## 2020-03-04 DIAGNOSIS — N183 Chronic kidney disease, stage 3 unspecified: Secondary | ICD-10-CM | POA: Diagnosis not present

## 2020-03-04 DIAGNOSIS — Z881 Allergy status to other antibiotic agents status: Secondary | ICD-10-CM | POA: Diagnosis not present

## 2020-03-04 MED ORDER — FUROSEMIDE 80 MG PO TABS: 80.0000 mg | ORAL_TABLET | Freq: Every day | ORAL | 3 refills | Status: DC

## 2020-03-04 MED ORDER — METOPROLOL SUCCINATE ER 25 MG PO TB24: 12.5000 mg | ORAL_TABLET | Freq: Every day | ORAL | 3 refills | Status: DC

## 2020-03-04 NOTE — Progress Notes (Signed)
Date:  03/04/2020   ID:  [name], DOB [birth year], MRN [id number]    Provider location: [facility] Type of Visit: Established patient   PCP:  [name], MD  Cardiologist:  Quay Burow, MD Primary HF: Dr. Aundra Dubin  Chief Complaint: Shortness of breath   History of Present Illness: [name] is a 79 y.o. female who has a history of PAD, CAD s/p CABG, mechanical AVR, pulmonary hypertension, and chronic diastolic CHF with prominent RV dysfunction. Patient had CABG-AVR in 10/05.  She has been in chronic atrial fibrillation, it appears, since at least 4/17.  She has significant PAD followed by Dr. [name].    She was admitted in 5/19 with dyspnea and marked volume overloaded. She was started on Lasix and it was titrated up.  Echo showed EF 55-60% with moderate RV dilation, severe RV systolic dysfunction, and PASP 88 mmHg.  [place] in 9/19 showed moderate pulmonary hypertension with near normal filling pressures. PVR was not particularly high at 3.1 WU primarily due to elevated cardiac output. V/Q scan did not show chronic PEs and PFTs were unremarkable. She is on Adcirca.   She was admitted in 2/20 with upper GI bleeding, found to have gastric ulcers.  She had a CT chest in 3/20 with subtle bilateral ground glass opacities.  Referred to pulmonary.   She was admitted again in 6/20 with a GI bleed and required transfusion.   She was seen by pulmonary in 7/20, PFTs showed no obstruction/restriction but decreased DLCO.    She was seen by rheumatology due to concern for rheumatoid arthritis.  She was thought to have gout and OA but no RA.   Echo was done in 7/20 showing  EF 60-65% with mild LVH, normal RV, mechanical aortic valve with mean gradient 17 mmHg, PASP 34 mmHg.   In 8/20, she was admitted with CHF/pneumonitis and pancytopenia.  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  She has been  treated  with rituximab and prednisone, now just getting rituximab.   Last echo in 4/21 showed EF 65-70%, normal RV, moderate TR, mechanical aortic valve with mean gradient 20 mmHg, PASP 36 mmHg.   She returns today for followup of CHF, CAD, atrial fibrillation. Creatinine was up to 2.66 recently with no changes in her medication regimen.  She did have a UTI recently.  No dyspnea walking on flat ground.  She uses a cane for balance.  No chest pain.  No orthopnea/PND.  Not very active.   Labs (8/19): K 4.2, creatinine 0.8, LDL 56 Labs (9/19): K 4.3, Na 129, creatinine 0.87, hgb 9.8 Labs (10/19): ANA+, RF 36.8 (elevated), SCL-70 negative, anti-centromere Ab negative, ds-DNA negative.  Labs (12/19): K 4, creatinine 1.09 Labs (1/20): CCP Ab elevated 115 Labs (2/20): K 4.1, creatinine 1.24 Labs (7/20): K 4.2, creatinine 1.99, hgb 6.5 (transfused) Labs (11/20): K 3.5, creatinine 1.64 Labs (12/20): LDL 60 Labs (5/21): K 4.4, creatinine 1.85 => 2.66  6 min walk (10/19): 189 m 6 min walk (1/20): 183 m  ECG (personally reviewed): NSR at 55  PMH: 1. Mechanical [employer] Jude aortic valve 10/05: Goal INR 2.5-3.5.  2. CAD: s/p CABG x 2 with AVR in 10/05.  - Cardiolite in 2014 showed no ischemia.  3. HTN 4. Hyperlipidemia 5. Type 2 diabetes 6. PAD: Peripheral arterial dopplers (7/19) with >50% CIA stenosis on right, 70-99% CIA stenosis on the left.  7.  H/o CCY 8. Atrial fibrillation: Chronic since at least 4/17. 9. Chronic diastolic CHF with prominent RV dysfunction: Echo (5/19) with EF 02-54%, grade 3 diastolic dysfunction, mechanical aortic valve with mean gradient 10 mmHg, mild MR, moderate RV dilation with moderate-severely decreased RV systolic function, biatrial enlargement, PASP 88 mmHg.  - Echo (7/20): EF 60-65% with mild LVH, normal RV, mechanical aortic valve with mean gradient 17 mmHg, PASP 34 mmHg.  - Echo (4/21): EF 65-70%, normal RV, moderate TR, mechanical aortic valve with mean gradient 20  mmHg, PASP 36 mmHg.  10. Carotid stenosis: 40-59% BICA stenosis on 7/19 carotid dopplers.  - Carotid dopplers (5/21): 40-59% BICA stenosis.  11. Pulmonary hypertension: RHC (9/19) with mean RA 5, PA 63/17 mean 36, mean PCWP 16, CI 4.16, PVR 3.1 WU.  - V/Q scan (10/19): no evidence for chronic PE.  - PFTs (10/19): minimal obstruction.  - ANA+, RF 36.8 (elevated), SCL-70 negative, anti-centromere Ab negative, ds-DNA negative, CCP Ab positive. She saw rheumatology, NOT thought to have RA.  - Echo in 7/20 with normal-appearing RV and PASP 34 mmHg.  - PFTs (7/20) with no significant obstruction/restriction but decreased DLCO.  - PYP scan not suggestive of TTR amyloidosis.  - Echo (4/21): EF 65-70%, normal RV, moderate TR, mechanical aortic valve with mean gradient 20 mmHg, PASP 36 mmHg.  12. 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 History of GI bleeding.  15. 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  - Getting Rituximab and prednisone.  16. CKD: stage 3.   Current Outpatient Medications  Medication Sig Dispense Refill  . acetaminophen (TYLENOL) 325 MG tablet Take 650 mg by mouth every 6 (six) hours as needed for mild pain.     [name] allopurinol (ZYLOPRIM) 100 MG tablet Take 100 mg by mouth daily.    [name] ALPRAZolam (XANAX) 0.5 MG tablet Take 0.25 mg by mouth at bedtime.     [name] amLODipine (NORVASC) 10 MG tablet Take 1 tablet (10 mg total) by mouth daily. 90 tablet 3  . atorvastatin (LIPITOR) 80 MG tablet Take 1 tablet (80 mg total) by mouth daily at 6 PM.    . Cholecalciferol (VITAMIN D3) 1000 UNITS CAPS Take 2,000 Units by mouth daily with lunch.     . cloNIDine (CATAPRES) 0.2 MG tablet Take 1 tablet (0.2 mg total) by mouth 2 (two) times daily. 60 tablet 3  . colchicine 0.6 MG tablet     . Cyanocobalamin (VITAMIN B12) 1000 MCG TBCR Take 1,000 mcg by mouth daily with lunch.     . furosemide (LASIX) 80 MG tablet Take 1 tablet (80 mg total) by mouth  daily. 90 tablet 3  . hydrALAZINE (APRESOLINE) 100 MG tablet TAKE 1 TABLET THREE TIMES DAILY 270 tablet 3  . lactose free nutrition (BOOST) LIQD Take 237 mLs by mouth daily. (To supplement meals)    . magnesium oxide (MAG-OX) 400 (241.3 Mg) MG tablet Take 1 tablet (400 mg total) by mouth daily.    . metFORMIN (GLUCOPHAGE) 500 MG tablet Take 500 mg by mouth 2 (two) times daily with a meal.     . pantoprazole (PROTONIX) 40 MG tablet Take 40 mg by mouth 2 (two) times daily.     [name] spironolactone (ALDACTONE) 25 MG tablet Take 1 tablet (25 mg total) by mouth daily. 90 tablet 3  . tadalafil, PAH, (ADCIRCA) 20 MG tablet Take 2 tablets (40 mg total) by mouth daily. 60 tablet 6  . vitamin C (ASCORBIC ACID) 500  MG tablet Take 500 mg by mouth daily with lunch.    . warfarin (COUMADIN) 5 MG tablet TAKE 1.5 TO 2 TABLETS BY MOUTH DAILY AS DIRECTED BY COUMADIN CLINIC 180 tablet 0  . metoprolol succinate (TOPROL XL) 25 MG 24 hr tablet Take 0.5 tablets (12.5 mg total) by mouth daily. 45 tablet 3   No current facility-administered medications for this encounter.    Allergies:   Flagyl [metronidazole], Coreg [carvedilol], Losartan, Verapamil, Zetia [ezetimibe], and Zocor [simvastatin - high dose]   Social History:  The patient  reports that she has quit smoking. Her smoking use included cigarettes. She has a 30.00 pack-year smoking history. She has never used smokeless tobacco. She reports that she does not drink alcohol or use drugs.   Family History:  [name] family history is not on file.   ROS:  Please see the history of present illness.   All other systems are personally reviewed and negative.   Exam:   BP (!) 128/40   Pulse 64   Wt 56.4 kg (124 lb 6.4 oz)   SpO2 97%   BMI 22.75 kg/m  General: NAD Neck: No JVD, no thyromegaly or thyroid nodule.  Lungs: Clear to auscultation bilaterally with normal respiratory effort. CV: Nondisplaced PMI.  Heart regular S1/S2 with mechanical S2, no S3/S4, 2/6  SEM RUSB.  No peripheral edema.  No carotid bruit.  Normal pedal pulses.  Abdomen: Soft, nontender, no hepatosplenomegaly, no distention.  Skin: Intact without lesions or rashes.  Neurologic: Alert and oriented x 3.  Psych: Normal affect. Extremities: No clubbing or cyanosis.  HEENT: Normal.   Recent Labs: 05/30/2019: Magnesium 2.1 07/07/2019: B Natriuretic Peptide 311.8 03/01/2020: ALT 16; BUN 82; Creatinine 2.66; Hemoglobin 10.6; Platelets 179; Potassium 4.4; Sodium 133  Personally reviewed   Wt Readings from Last 3 Encounters:  03/04/20 56.4 kg (124 lb 6.4 oz)  02/02/20 56.1 kg (123 lb 9.6 oz)  01/05/20 56.4 kg (124 lb 4 oz)     ASSESSMENT AND PLAN:  1. Atrial fibrillation: Paroxysmal. She is in NSR today.    - She is on metoprolol tartrate 12.5 mg daily.  I will stop this and start Toprol XL 12.5 mg daily.    - Continue warfarin, goal INR 2.5-3.5. No ASA with history of GI bleeding.  2. Chronic diastolic CHF: With prominent RV dysfunction and severe pulmonary hypertension by echo in 2019. Filling pressures were well-compensated on 9/19 RHC. Repeat echo in 7/20 showed normal RV size and systolic function and PA systolic pressure down to 34 mmHg.  PYP scan did not suggest ATTR amyloidosis.  Echo in 4/21 was similar to 7/20.  NYHA class II. She is not volume overloaded.  Creatinine higher recently.  - Decrease Lasix to 80 mg daily. BMET in 1 week.  3. CAD: No chest pain.   - Continue atorvastatin and warfarin.  4. PAD: Significant on 7/19 dopplers but no claudication, no pedal ulcerations. Followed by Dr. [name].  5. Mechanical aortic valve: Mean gradient mildly higher at 20 mmHg on 4/21 echo.  Given co-existing atrial fibrillation, goal INR is 2.5-3.5.  - Continue warfarin.  - No ASA with GI bleeding.  6. Pulmonary hypertension: Moderate pulmonary hypertension on 9/19 with RV dysfunction and dilation noted on last echo. PVR only 3.1 WU but cardiac output was high.  V/Q scan in 10/19  with no evidence for chronic PEs and PFTs in 10/19 and again in 7/20 were unremarkable except for low DLCO.  I  think there is a possible group 1 PH component though this is a borderline call as PVR is not significantly elevated. ANA and RF positive, as is CCP Ab.  She saw rheumatology and per her report, was not thought to have rheumatoid arthritis.  Subsequently, she has been diagnosed with p-ANCA+ vasculitis. CT chest showed subtle bilateral ground glass opacities in 2020, she is followed by pulmonary.    - Continue Adcirca 40 mg daily.  It was borderline to start this, and I am not sure she has had marked benefit.  Will hold off on starting a 2nd [place] medication for now.  Repeat echo in 4/21 showed normal RV and estimated PA systolic pressure 36 mmHg.  7. Rheumatology: Joint pain, positive RF and CCP Ab.  Lung findings (ground glass, pulmonary hypertension) also thought to be concerning for RA. However, per patient's report, she was by her rheumatologist that did not have RA and did have gout.  More recently, she has been found to have p-ANCA+ vasculitis, eosinophilic granulomatosis with polyangiitis.  She is being treated with rituximab and prednisone by Dr Jana Hakim.   8. HTN: BP controlled on current antihypertensive regimen.  9. CKD: Stage 3.  Creatinine recently up to 2.66 with no med changes.  - Decreasing Lasix to 80 mg daily.  - I will refer to nephrology, ?involvement by granulomatosis with polyangiitis.    Followup with NP/PA in 1 month.     Signed, Loralie Champagne, MD  03/04/2020   [facility] [address] [facility] [place] [address] [phone] (office) [phone] (fax)

## 2020-03-04 NOTE — Patient Instructions (Signed)
No labs done today.  START Metoprolol XL(Toprolol XL) 12.5mg (1/2 tablet) by mouth daily.  DECREASE Lasix 80mg (1 tablet) by mouth daily.  No other medication changes were made. Please continue all other medications as prescribed.  You have been referred to [facility]. They will contact you to schedule an appointment.  Your physician recommends that you schedule a follow-up appointment in: 1 week for a lab only appointment and in 1 month for an appointment with our [facility](here at the office)  At the [facility], you and your health needs are our priority. As part of our continuing mission to provide you with exceptional heart care, we have created designated Provider Care Teams. These Care Teams include your primary Cardiologist (physician) and Advanced Practice Providers (APPs- Physician Assistants and Nurse Practitioners) who all work together to provide you with the care you need, when you need it.   You may see any of the following providers on your designated Care Team at your next follow up: [name] Dr Glori Bickers . Dr Loralie Champagne . [name], NP . Lyda Jester, PA . Audry Riles, PharmD   Please be sure to bring in all your medications bottles to every appointment.

## 2020-03-05 ENCOUNTER — Telehealth (HOSPITAL_COMMUNITY): Payer: Self-pay

## 2020-03-05 NOTE — Telephone Encounter (Signed)
New patient referral and supporting documents faxed to [facility]. Confirmation received

## 2020-03-09 ENCOUNTER — Other Ambulatory Visit: Payer: Self-pay

## 2020-03-09 ENCOUNTER — Encounter: Payer: Self-pay | Admitting: Podiatry

## 2020-03-09 ENCOUNTER — Ambulatory Visit (INDEPENDENT_AMBULATORY_CARE_PROVIDER_SITE_OTHER): Payer: Medicare HMO | Admitting: Podiatry

## 2020-03-09 DIAGNOSIS — E119 Type 2 diabetes mellitus without complications: Secondary | ICD-10-CM

## 2020-03-09 DIAGNOSIS — R600 Localized edema: Secondary | ICD-10-CM

## 2020-03-09 DIAGNOSIS — M79675 Pain in left toe(s): Secondary | ICD-10-CM

## 2020-03-09 DIAGNOSIS — B351 Tinea unguium: Secondary | ICD-10-CM | POA: Diagnosis not present

## 2020-03-09 DIAGNOSIS — M79674 Pain in right toe(s): Secondary | ICD-10-CM

## 2020-03-09 NOTE — Patient Instructions (Addendum)
Check the compression of your stockings at home.  Measurements for open-toe compression hose: Left ankle 11 1/2 inches Left calf    12 inches  Right ankle 10 1/2 inches Right calf    12 inches  Edema  Edema is an abnormal buildup of fluids in the body tissues and under the skin. Swelling of the legs, feet, and ankles is a common symptom that becomes more likely as you get older. Swelling is also common in looser tissues, like around the eyes. When the affected area is squeezed, the fluid may move out of that spot and leave a dent for a few moments. This dent is called pitting edema. There are many possible causes of edema. Eating too much salt (sodium) and being on your feet or sitting for a long time can cause edema in your legs, feet, and ankles. Hot weather may make edema worse. Common causes of edema include:  Heart failure.  Liver or kidney disease.  Weak leg blood vessels.  Cancer.  An injury.  Pregnancy.  Medicines.  Being obese.  Low protein levels in the blood. Edema is usually painless. Your skin may look swollen or shiny. Follow these instructions at home:  Keep the affected body part raised (elevated) above the level of your heart when you are sitting or lying down.  Do not sit still or stand for long periods of time.  Do not wear tight clothing. Do not wear garters on your upper legs.  Exercise your legs to get your circulation going. This helps to move the fluid back into your blood vessels, and it may help the swelling go down.  Wear elastic bandages or support stockings to reduce swelling as told by your health care provider.  Eat a low-salt (low-sodium) diet to reduce fluid as told by your health care provider.  Depending on the cause of your swelling, you may need to limit how much fluid you drink (fluid restriction).  Take over-the-counter and prescription medicines only as told by your health care provider. Contact a health care provider  if:  Your edema does not get better with treatment.  You have heart, liver, or kidney disease and have symptoms of edema.  You have sudden and unexplained weight gain. Get help right away if:  You develop shortness of breath or chest pain.  You cannot breathe when you lie down.  You develop pain, redness, or warmth in the swollen areas.  You have heart, liver, or kidney disease and suddenly get edema.  You have a fever and your symptoms suddenly get worse. Summary  Edema is an abnormal buildup of fluids in the body tissues and under the skin.  Eating too much salt (sodium) and being on your feet or sitting for a long time can cause edema in your legs, feet, and ankles.  Keep the affected body part raised (elevated) above the level of your heart when you are sitting or lying down. This information is not intended to replace advice given to you by your health care provider. Make sure you discuss any questions you have with your health care provider. Document Revised: 02/12/2019 Document Reviewed: 10/28/2016 Elsevier Patient Education  2020 [place] An ingrown toenail occurs when the corner or sides of a toenail grow into the surrounding skin. This causes discomfort and pain. The big toe is most commonly affected, but any of the toes can be affected. If an ingrown toenail is not treated, it can become infected. What  are the causes? This condition may be caused by:  Wearing shoes that are too small or tight.  An injury, such as stubbing your toe or having your toe stepped on.  Improper cutting or care of your toenails.  Having nail or foot abnormalities that were present from birth (congenital abnormalities), such as having a nail that is too big for your toe. What increases the risk? The following factors may make you more likely to develop ingrown toenails:  Age. Nails tend to get thicker with age, so ingrown nails are more common among older  people.  Cutting your toenails incorrectly, such as cutting them very short or cutting them unevenly. An ingrown toenail is more likely to get infected if you have:  Diabetes.  Blood flow (circulation) problems. What are the signs or symptoms? Symptoms of an ingrown toenail may include:  Pain, soreness, or tenderness.  Redness.  Swelling.  Hardening of the skin that surrounds the toenail. Signs that an ingrown toenail may be infected include:  Fluid or pus.  Symptoms that get worse instead of better. How is this diagnosed? An ingrown toenail may be diagnosed based on your medical history, your symptoms, and a physical exam. If you have fluid or blood coming from your toenail, a sample may be collected to test for the specific type of bacteria that is causing the infection. How is this treated? Treatment depends on how severe your ingrown toenail is. You may be able to care for your toenail at home.  If you have an infection, you may be prescribed antibiotic medicines.  If you have fluid or pus draining from your toenail, your health care provider may drain it.  If you have trouble walking, you may be given crutches to use.  If you have a severe or infected ingrown toenail, you may need a procedure to remove part or all of the nail. Follow these instructions at home: Foot care   Do not pick at your toenail or try to remove it yourself.  Soak your foot in warm, soapy water. Do this for 20 minutes, 3 times a day, or as often as told by your health care provider. This helps to keep your toe clean and keep your skin soft.  Wear shoes that fit well and are not too tight. Your health care provider may recommend that you wear open-toed shoes while you heal.  Trim your toenails regularly and carefully. Cut your toenails straight across to prevent injury to the skin at the corners of the toenail. Do not cut your nails in a curved shape.  Keep your feet clean and dry to help  prevent infection. Medicines  Take over-the-counter and prescription medicines only as told by your health care provider.  If you were prescribed an antibiotic, take it as told by your health care provider. Do not stop taking the antibiotic even if you start to feel better. Activity  Return to your normal activities as told by your health care provider. Ask your health care provider what activities are safe for you.  Avoid activities that cause pain. General instructions  If your health care provider told you to use crutches to help you move around, use them as instructed.  Keep all follow-up visits as told by your health care provider. This is important. Contact a health care provider if:  You have more redness, swelling, pain, or other symptoms that do not improve with treatment.  You have fluid, blood, or pus coming from your toenail.  Get help right away if:  You have a red streak on your skin that starts at your foot and spreads up your leg.  You have a fever. Summary  An ingrown toenail occurs when the corner or sides of a toenail grow into the surrounding skin. This causes discomfort and pain. The big toe is most commonly affected, but any of the toes can be affected.  If an ingrown toenail is not treated, it can become infected.  Fluid or pus draining from your toenail is a sign of infection. Your health care provider may need to drain it. You may be given antibiotics to treat the infection.  Trimming your toenails regularly and properly can help you prevent an ingrown toenail. This information is not intended to replace advice given to you by your health care provider. Make sure you discuss any questions you have with your health care provider. Document Revised: 01/17/2019 Document Reviewed: 06/13/2017 Elsevier Patient Education  [place].

## 2020-03-11 ENCOUNTER — Ambulatory Visit (HOSPITAL_COMMUNITY)
Admission: RE | Admit: 2020-03-11 | Discharge: 2020-03-11 | Disposition: A | Discharge status: Home / Self Care | Payer: Medicare HMO | Source: Ambulatory Visit | Attending: Cardiology | Admitting: Cardiology

## 2020-03-11 ENCOUNTER — Other Ambulatory Visit: Payer: Self-pay

## 2020-03-11 DIAGNOSIS — I5032 Chronic diastolic (congestive) heart failure: Secondary | ICD-10-CM | POA: Diagnosis not present

## 2020-03-11 LAB — BASIC METABOLIC PANEL
Anion gap: 14 (ref 5–15)
BUN: 66 mg/dL — ABNORMAL HIGH (ref 8–23)
CO2: 21 mmol/L — ABNORMAL LOW (ref 22–32)
Calcium: 9.3 mg/dL (ref 8.9–10.3)
Chloride: 100 mmol/L (ref 98–111)
Creatinine, Ser: 1.73 mg/dL — ABNORMAL HIGH (ref 0.44–1.00)
GFR calc Af Amer: 32 mL/min — ABNORMAL LOW (ref >60)
GFR calc non Af Amer: 27 mL/min — ABNORMAL LOW (ref >60)
Glucose, Bld: 201 mg/dL — ABNORMAL HIGH (ref 70–99)
Potassium: 3.9 mmol/L (ref 3.5–5.1)
Sodium: 135 mmol/L (ref 135–145)

## 2020-03-14 NOTE — Progress Notes (Signed)
Subjective: [name] presents today painful mycotic nails b/l that are difficult to trim. Pain interferes with ambulation. Aggravating factors include wearing enclosed shoe gear. Pain is relieved with periodic professional debridement.  [name], [name], MD is patient's PCP. Last visit was: 12/18/2019.  Past Medical History:  Diagnosis Date  . Aortic atherosclerosis ([place]) 01/23/2018  . Atrial fibrillation ([place])   . Carotid artery disease ([place])   . Cholelithiasis 01/23/2018  . Chronic anticoagulation   . Coronary artery disease    status post coronary artery bypass grafting times 07/10/2004  . Diabetes mellitus   . Hypercholesteremia   . Hypertension   . Mechanical heart valve present    H. aortic valve replacement at the time of bypass surgery October 2005  . Moderate to severe pulmonary hypertension ([place])   . Peripheral arterial disease ([facility])    history of left common iliac artery PTA and stenting for a chronic total occlusion 08/26/01     Current Outpatient Medications on File Prior to Visit  Medication Sig Dispense Refill  . acetaminophen (TYLENOL) 325 MG tablet Take 650 mg by mouth every 6 (six) hours as needed for mild pain.     [name] allopurinol (ZYLOPRIM) 100 MG tablet Take 100 mg by mouth daily.    [name] ALPRAZolam (XANAX) 0.5 MG tablet Take 0.25 mg by mouth at bedtime.     [name] amLODipine (NORVASC) 10 MG tablet Take 1 tablet (10 mg total) by mouth daily. 90 tablet 3  . atorvastatin (LIPITOR) 80 MG tablet Take 1 tablet (80 mg total) by mouth daily at 6 PM.    . Cholecalciferol (VITAMIN D3) 1000 UNITS CAPS Take 2,000 Units by mouth daily with lunch.     . ciprofloxacin (CIPRO) 500 MG tablet     . cloNIDine (CATAPRES) 0.2 MG tablet Take 1 tablet (0.2 mg total) by mouth 2 (two) times daily. 60 tablet 3  . colchicine 0.6 MG tablet     . Cyanocobalamin (VITAMIN B12) 1000 MCG TBCR Take 1,000 mcg by mouth daily with lunch.     . DUREZOL 0.05 % EMUL Place 1 drop into the right eye 3  (three) times daily.    . furosemide (LASIX) 80 MG tablet Take 1 tablet (80 mg total) by mouth daily. 90 tablet 3  . gatifloxacin (ZYMAXID) 0.5 % SOLN Place 1 drop into the right eye 4 (four) times daily.    . hydrALAZINE (APRESOLINE) 100 MG tablet TAKE 1 TABLET THREE TIMES DAILY 270 tablet 3  . ketorolac (ACULAR) 0.5 % ophthalmic solution Place 1 drop into the right eye 4 (four) times daily.    [name] lactose free nutrition (BOOST) LIQD Take 237 mLs by mouth daily. (To supplement meals)    . magnesium oxide (MAG-OX) 400 (241.3 Mg) MG tablet Take 1 tablet (400 mg total) by mouth daily.    . metFORMIN (GLUCOPHAGE) 500 MG tablet Take 500 mg by mouth 2 (two) times daily with a meal.     . methocarbamol (ROBAXIN) 500 MG tablet     . metoprolol succinate (TOPROL XL) 25 MG 24 hr tablet Take 0.5 tablets (12.5 mg total) by mouth daily. 45 tablet 3  . pantoprazole (PROTONIX) 40 MG tablet Take 40 mg by mouth 2 (two) times daily.     [name] spironolactone (ALDACTONE) 25 MG tablet Take 1 tablet (25 mg total) by mouth daily. 90 tablet 3  . tadalafil, PAH, (ADCIRCA) 20 MG tablet Take 2 tablets (40 mg total) by mouth daily.  60 tablet 6  . vitamin C (ASCORBIC ACID) 500 MG tablet Take 500 mg by mouth daily with lunch.    . warfarin (COUMADIN) 5 MG tablet TAKE 1.5 TO 2 TABLETS BY MOUTH DAILY AS DIRECTED BY COUMADIN CLINIC 180 tablet 0   No current facility-administered medications on file prior to visit.     Allergies  Allergen Reactions  . Flagyl [Metronidazole] Rash    ALL-OVER BODY RASH  . Coreg [Carvedilol] Other (See Comments)    Terrible cramping in the feet and had a lot of bowel movements, but not diarrhea  . Losartan Swelling    Patient doesn't recall site of swelling  . Verapamil Hives  . Zetia [Ezetimibe] Other (See Comments)    Reaction not recalled  . Zocor [Simvastatin - High Dose] Other (See Comments)    Reaction not recalled    Objective: [name] is a pleasant 79 y.o. y.o. Patient  Race: White or Caucasian [1]  female in NAD. AAO x 3.  There were no vitals filed for this visit.  Vascular Examination: Neurovascular status unchanged b/l lower extremities. Capillary fill time to digits <3 seconds b/l lower extremities. Palpable DP pulses b/l. Palpable PT pulses b/l. Pedal hair absent b/l. Skin temperature gradient within normal limits b/l. +1 pitting edema b/l lower extremities.  Dermatological Examination: Pedal skin is thin shiny, atrophic bilaterally. No open wounds bilaterally. No interdigital macerations bilaterally. Toenails 1-5 b/l elongated, discolored, dystrophic, thickened, crumbly with subungual debris and tenderness to dorsal palpation.   Incurvated nailplate right great toe medial border(s) with tenderness to palpation. No erythema, no edema, no drainage noted.  Musculoskeletal: Normal muscle strength 5/5 to all lower extremity muscle groups bilaterally. No pain crepitus or joint limitation noted with ROM b/l. No gross bony deformities bilaterally.   Measurements of BLE: Left ankle: 11 1/2 inches Left calf:  12 inches Right ankle: 10 1/2 inches Right calf:  12 inches  Neurological Examination: Protective sensation intact 5/5 intact bilaterally with 10g monofilament b/l. Vibratory sensation intact b/l. Proprioception intact bilaterally.  Assessment: 1. Pain due to onychomycosis of toenails of both feet   2. Edema, lower extremity   3. Controlled type 2 diabetes mellitus without complication, without long-term current use of insulin ([place])   Plan: -Examined patient. -Discussed lower extremity edema. Recommended open toed compression hose 20-30 mmHg. Lower extremities measured today. -Toenails 1-5 b/l were debrided in length and girth with sterile nail nippers and dremel without iatrogenic bleeding.  -Patient to continue soft, supportive shoe gear daily. -Patient to report any pedal injuries to medical professional immediately. -Offending nail border  debrided and curretaged R hallux utilizing sterile nail nipper and currette. Border cleansed with alcohol and triple antibiotic applied. No further treatment required by patient/caregiver. -Patient/POA to call should there be question/concern in the interim.  Return in about 9 weeks (around 05/11/2020) for nail trim/ Coumadin.  Marzetta Board, DPM

## 2020-03-22 DIAGNOSIS — H2511 Age-related nuclear cataract, right eye: Secondary | ICD-10-CM | POA: Diagnosis not present

## 2020-03-23 DIAGNOSIS — H2512 Age-related nuclear cataract, left eye: Secondary | ICD-10-CM | POA: Diagnosis not present

## 2020-03-24 ENCOUNTER — Other Ambulatory Visit: Payer: Self-pay

## 2020-03-24 NOTE — Patient Outreach (Signed)
[facility] Care Management  [place]  03/24/2020   [name] 1941/08/30 [id number]  Subjective: Telephone call to patient for disease management follow up.  Patient reports she is doing pretty good.  She reports that her endurance is better and that she is independent with care now. She has cataract surgery on Monday and is doing well.  Allowing her eye to heal.  Patient reports at times some swelling to legs but has hose to put on to help.  She reports that her last weight was 124 lbs.  Patient to see cardiology and kidney doctor soon.  She denies problems with transportation.  Patient voices no concerns.    Objective:   Encounter Medications:  Outpatient Encounter Medications as of 03/24/2020  Medication Sig  . acetaminophen (TYLENOL) 325 MG tablet Take 650 mg by mouth every 6 (six) hours as needed for mild pain.   [name] allopurinol (ZYLOPRIM) 100 MG tablet Take 100 mg by mouth daily.  [name] ALPRAZolam (XANAX) 0.5 MG tablet Take 0.25 mg by mouth at bedtime.   [name] amLODipine (NORVASC) 10 MG tablet Take 1 tablet (10 mg total) by mouth daily.  [name] atorvastatin (LIPITOR) 80 MG tablet Take 1 tablet (80 mg total) by mouth daily at 6 PM.  . Cholecalciferol (VITAMIN D3) 1000 UNITS CAPS Take 2,000 Units by mouth daily with lunch.   . cloNIDine (CATAPRES) 0.2 MG tablet Take 1 tablet (0.2 mg total) by mouth 2 (two) times daily.  . colchicine 0.6 MG tablet   . DUREZOL 0.05 % EMUL Place 1 drop into the right eye 3 (three) times daily.  . furosemide (LASIX) 80 MG tablet Take 1 tablet (80 mg total) by mouth daily.  [name] gatifloxacin (ZYMAXID) 0.5 % SOLN Place 1 drop into the right eye 4 (four) times daily.  . hydrALAZINE (APRESOLINE) 100 MG tablet TAKE 1 TABLET THREE TIMES DAILY  . ketorolac (ACULAR) 0.5 % ophthalmic solution Place 1 drop into the right eye 4 (four) times daily.  [name] lactose free nutrition (BOOST) LIQD Take 237 mLs by mouth daily. (To supplement meals)  . magnesium  oxide (MAG-OX) 400 (241.3 Mg) MG tablet Take 1 tablet (400 mg total) by mouth daily.  . metFORMIN (GLUCOPHAGE) 500 MG tablet Take 500 mg by mouth 2 (two) times daily with a meal.   . methocarbamol (ROBAXIN) 500 MG tablet   . metoprolol succinate (TOPROL XL) 25 MG 24 hr tablet Take 0.5 tablets (12.5 mg total) by mouth daily.  . pantoprazole (PROTONIX) 40 MG tablet Take 40 mg by mouth 2 (two) times daily.   [name] spironolactone (ALDACTONE) 25 MG tablet Take 1 tablet (25 mg total) by mouth daily.  . tadalafil, PAH, (ADCIRCA) 20 MG tablet Take 2 tablets (40 mg total) by mouth daily.  . vitamin C (ASCORBIC ACID) 500 MG tablet Take 500 mg by mouth daily with lunch.  . warfarin (COUMADIN) 5 MG tablet TAKE 1.5 TO 2 TABLETS BY MOUTH DAILY AS DIRECTED BY COUMADIN CLINIC  . ciprofloxacin (CIPRO) 500 MG tablet   . Cyanocobalamin (VITAMIN B12) 1000 MCG TBCR Take 1,000 mcg by mouth daily with lunch.    No facility-administered encounter medications on file as of 03/24/2020.    Functional Status:  In your present state of health, do you have any difficulty performing the following activities: 01/26/2020 07/31/2019  Hearing? N N  Vision? N N  Difficulty concentrating or making decisions? N N  Walking or climbing stairs? Tempie Donning  Comment weakness weakness  Dressing or bathing? N N  Doing errands, shopping? Tempie Donning  Comment son and neighbor assists as needed son or neighbor assists  Conservation officer, nature and eating ? N N  Using the Toilet? N N  In the past six months, have you accidently leaked urine? Y Y  Comment occasional occasional  Do you have problems with loss of bowel control? N N  Managing your Medications? N N  Managing your Finances? N N  Housekeeping or managing your Housekeeping? Tempie Donning  Comment family assists as needed family assists  Some recent data might be hidden    Fall/Depression Screening: Fall Risk  03/24/2020 01/26/2020 11/25/2019  Falls in the past year? 0 0 0  Comment - - -  Number falls in past  yr: - - -  Injury with Fall? - - -  Risk for fall due to : - - -  Risk for fall due to: Comment - - -  Follow up - - -   PHQ 2/9 Scores 01/26/2020 11/25/2019 07/31/2019 05/12/2019 03/24/2019 07/09/2018 06/13/2018  PHQ - 2 Score _0 0 0  PHQ- 9 Score - - 9 9 - - -    Assessment: Patient continues to manage chronic conditions.  Patient benefits from disease management follow up.    Plan:  [facility] CM Care Plan Problem One     Most Recent Value  Care Plan Problem One Activity intolerance related to anemia  Role Documenting the Problem One Care Management Telephonic [place] for Problem One Active  [facility] Long Term Goal  Patient will report continued ability to complete activities of daily living over the next 60 days.  [facility] Long Term Goal Start Date 11/25/19  [facility] Long Term Goal Met Date 03/24/20    [facility] CM Care Plan Problem Two     Most Recent Value  Care Plan Problem Two Heart Failure Knowledge deficit  Role Documenting the Problem Two Care Management Telephonic Coordinator  Care Plan for Problem Two Active  [facility] for Problem Two Long Term Goal  RN CM  reviewed with patient signs and symptoms of heart failure and when to notify physician.   [facility] Long Term Goal Patient will be able to describe signs and symptoms of heart failure within 90 days.  [facility] Long Term Goal Start Date 03/24/20     RN CM will contact patient again in the month of August and patient agreeable.    [name], RN, MSN [place] Management Care Management Coordinator Direct Line [phone] Cell [phone] Toll Free: [phone]  Fax: [phone]

## 2020-03-25 ENCOUNTER — Ambulatory Visit (INDEPENDENT_AMBULATORY_CARE_PROVIDER_SITE_OTHER): Payer: Medicare HMO | Admitting: Pharmacist

## 2020-03-25 ENCOUNTER — Other Ambulatory Visit: Payer: Self-pay

## 2020-03-25 ENCOUNTER — Encounter (HOSPITAL_COMMUNITY): Payer: Medicare HMO

## 2020-03-25 ENCOUNTER — Ambulatory Visit: Payer: Self-pay

## 2020-03-25 DIAGNOSIS — Z7901 Long term (current) use of anticoagulants: Secondary | ICD-10-CM | POA: Diagnosis not present

## 2020-03-25 DIAGNOSIS — Z952 Presence of prosthetic heart valve: Secondary | ICD-10-CM

## 2020-03-25 LAB — POCT INR: INR: 1.7 — AB (ref 2.0–3.0)

## 2020-03-30 ENCOUNTER — Encounter: Payer: Self-pay | Admitting: Adult Health

## 2020-03-30 ENCOUNTER — Inpatient Hospital Stay: Payer: Medicare HMO

## 2020-03-30 ENCOUNTER — Inpatient Hospital Stay: Payer: Medicare HMO | Attending: Oncology

## 2020-03-30 ENCOUNTER — Other Ambulatory Visit: Payer: Self-pay

## 2020-03-30 ENCOUNTER — Inpatient Hospital Stay: Payer: Medicare HMO | Admitting: Adult Health

## 2020-03-30 VITALS — BP 164/55 | HR 54 | Temp 97.7°F | Resp 16

## 2020-03-30 VITALS — BP 171/53 | HR 50 | Temp 97.4°F | Wt 125.3 lb

## 2020-03-30 DIAGNOSIS — D508 Other iron deficiency anemias: Secondary | ICD-10-CM

## 2020-03-30 DIAGNOSIS — I776 Arteritis, unspecified: Secondary | ICD-10-CM | POA: Insufficient documentation

## 2020-03-30 DIAGNOSIS — D61818 Other pancytopenia: Secondary | ICD-10-CM

## 2020-03-30 DIAGNOSIS — N184 Chronic kidney disease, stage 4 (severe): Secondary | ICD-10-CM | POA: Diagnosis not present

## 2020-03-30 DIAGNOSIS — I7782 Antineutrophilic cytoplasmic antibody (ANCA) vasculitis: Secondary | ICD-10-CM

## 2020-03-30 DIAGNOSIS — Z5112 Encounter for antineoplastic immunotherapy: Secondary | ICD-10-CM | POA: Insufficient documentation

## 2020-03-30 DIAGNOSIS — D500 Iron deficiency anemia secondary to blood loss (chronic): Secondary | ICD-10-CM

## 2020-03-30 DIAGNOSIS — I739 Peripheral vascular disease, unspecified: Secondary | ICD-10-CM

## 2020-03-30 DIAGNOSIS — E1122 Type 2 diabetes mellitus with diabetic chronic kidney disease: Secondary | ICD-10-CM | POA: Diagnosis not present

## 2020-03-30 DIAGNOSIS — D696 Thrombocytopenia, unspecified: Secondary | ICD-10-CM

## 2020-03-30 DIAGNOSIS — M8580 Other specified disorders of bone density and structure, unspecified site: Secondary | ICD-10-CM | POA: Diagnosis not present

## 2020-03-30 DIAGNOSIS — D631 Anemia in chronic kidney disease: Secondary | ICD-10-CM | POA: Insufficient documentation

## 2020-03-30 DIAGNOSIS — D594 Other nonautoimmune hemolytic anemias: Secondary | ICD-10-CM

## 2020-03-30 LAB — CMP (CANCER CENTER ONLY)
ALT: 16 U/L (ref 0–44)
AST: 21 U/L (ref 15–41)
Albumin: 3.6 g/dL (ref 3.5–5.0)
Alkaline Phosphatase: 83 U/L (ref 38–126)
Anion gap: 10 (ref 5–15)
BUN: 57 mg/dL — ABNORMAL HIGH (ref 8–23)
CO2: 20 mmol/L — ABNORMAL LOW (ref 22–32)
Calcium: 9.3 mg/dL (ref 8.9–10.3)
Chloride: 103 mmol/L (ref 98–111)
Creatinine: 1.73 mg/dL — ABNORMAL HIGH (ref 0.44–1.00)
GFR, Est AFR Am: 32 mL/min — ABNORMAL LOW (ref >60)
GFR, Estimated: 27 mL/min — ABNORMAL LOW (ref >60)
Glucose, Bld: 152 mg/dL — ABNORMAL HIGH (ref 70–99)
Potassium: 4.4 mmol/L (ref 3.5–5.1)
Sodium: 133 mmol/L — ABNORMAL LOW (ref 135–145)
Total Bilirubin: 0.4 mg/dL (ref 0.3–1.2)
Total Protein: 6.3 g/dL — ABNORMAL LOW (ref 6.5–8.1)

## 2020-03-30 LAB — CBC WITH DIFFERENTIAL/PLATELET
Abs Immature Granulocytes: 0.04 10*3/uL (ref 0.00–0.07)
Basophils Absolute: 0.1 10*3/uL (ref 0.0–0.1)
Basophils Relative: 1 %
Eosinophils Absolute: 0.2 10*3/uL (ref 0.0–0.5)
Eosinophils Relative: 4 %
HCT: 33.1 % — ABNORMAL LOW (ref 36.0–46.0)
Hemoglobin: 10.8 g/dL — ABNORMAL LOW (ref 12.0–15.0)
Immature Granulocytes: 1 %
Lymphocytes Relative: 20 %
Lymphs Abs: 1.1 10*3/uL (ref 0.7–4.0)
MCH: 31 pg (ref 26.0–34.0)
MCHC: 32.6 g/dL (ref 30.0–36.0)
MCV: 95.1 fL (ref 80.0–100.0)
Monocytes Absolute: 0.6 10*3/uL (ref 0.1–1.0)
Monocytes Relative: 11 %
Neutro Abs: 3.4 10*3/uL (ref 1.7–7.7)
Neutrophils Relative %: 63 %
Platelets: 152 10*3/uL (ref 150–400)
RBC: 3.48 MIL/uL — ABNORMAL LOW (ref 3.87–5.11)
RDW: 14.9 % (ref 11.5–15.5)
WBC: 5.3 10*3/uL (ref 4.0–10.5)
nRBC: 0 % (ref 0.0–0.2)

## 2020-03-30 LAB — IRON AND TIBC
Iron: 123 ug/dL (ref 41–142)
Saturation Ratios: 46 % (ref 21–57)
TIBC: 268 ug/dL (ref 236–444)
UIBC: 144 ug/dL (ref 120–384)

## 2020-03-30 LAB — RETICULOCYTES
Immature Retic Fract: 5.5 % (ref 2.3–15.9)
RBC.: 3.44 MIL/uL — ABNORMAL LOW (ref 3.87–5.11)
Retic Count, Absolute: 34.1 10*3/uL (ref 19.0–186.0)
Retic Ct Pct: 1 % (ref 0.4–3.1)

## 2020-03-30 LAB — FERRITIN: Ferritin: 169 ng/mL (ref 11–307)

## 2020-03-30 LAB — SAVE SMEAR(SSMR), FOR PROVIDER SLIDE REVIEW

## 2020-03-30 MED ORDER — EPOETIN ALFA-EPBX 40000 UNIT/ML IJ SOLN
50000.0000 [IU] | Freq: Once | INTRAMUSCULAR | Status: AC
  Administered 2020-03-30: 50000 [IU] via SUBCUTANEOUS

## 2020-03-30 MED ORDER — EPOETIN ALFA-EPBX 40000 UNIT/ML IJ SOLN
INTRAMUSCULAR | Status: AC
  Filled 2020-03-30: qty 2

## 2020-03-30 MED ORDER — SODIUM CHLORIDE 0.9 % IV SOLN
375.0000 mg/m2 | Freq: Once | INTRAVENOUS | Status: AC
  Administered 2020-03-30: 600 mg via INTRAVENOUS
  Filled 2020-03-30: qty 50

## 2020-03-30 MED ORDER — SODIUM CHLORIDE 0.9 % IV SOLN
Freq: Once | INTRAVENOUS | Status: AC
  Filled 2020-03-30: qty 250

## 2020-03-30 MED ORDER — SODIUM CHLORIDE 0.9 % IV SOLN
Freq: Once | INTRAVENOUS | Status: DC
  Filled 2020-03-30: qty 250

## 2020-03-30 MED ORDER — ACETAMINOPHEN 325 MG PO TABS
ORAL_TABLET | ORAL | Status: AC
  Filled 2020-03-30: qty 2

## 2020-03-30 MED ORDER — ACETAMINOPHEN 325 MG PO TABS
650.0000 mg | ORAL_TABLET | Freq: Once | ORAL | Status: AC
  Administered 2020-03-30: 650 mg via ORAL

## 2020-03-30 MED ORDER — DIPHENHYDRAMINE HCL 25 MG PO CAPS
ORAL_CAPSULE | ORAL | Status: AC
  Filled 2020-03-30: qty 2

## 2020-03-30 MED ORDER — DIPHENHYDRAMINE HCL 25 MG PO CAPS
25.0000 mg | ORAL_CAPSULE | Freq: Once | ORAL | Status: AC
  Administered 2020-03-30: 25 mg via ORAL

## 2020-03-30 NOTE — Patient Instructions (Signed)
Cancer Center Discharge Instructions for Patients Receiving Chemotherapy  Today you received the following chemotherapy agents:  Rituxan.  To help prevent nausea and vomiting after your treatment, we encourage you to take your nausea medication as directed.   If you develop nausea and vomiting that is not controlled by your nausea medication, call the clinic.   BELOW ARE SYMPTOMS THAT SHOULD BE REPORTED IMMEDIATELY:  *FEVER GREATER THAN 100.5 F  *CHILLS WITH OR WITHOUT FEVER  NAUSEA AND VOMITING THAT IS NOT CONTROLLED WITH YOUR NAUSEA MEDICATION  *UNUSUAL SHORTNESS OF BREATH  *UNUSUAL BRUISING OR BLEEDING  TENDERNESS IN MOUTH AND THROAT WITH OR WITHOUT PRESENCE OF ULCERS  *URINARY PROBLEMS  *BOWEL PROBLEMS  UNUSUAL RASH Items with * indicate a potential emergency and should be followed up as soon as possible.  Feel free to call the clinic should you have any questions or concerns. The clinic phone number is [phone].  Please show the CHEMO ALERT CARD at check-in to the Emergency Department and triage nurse.   

## 2020-03-30 NOTE — Progress Notes (Signed)
[place]  [phone] [phone]    ID: [name] DOB: 1941/08/30  MR#: [id number]  GBT#:[id number]  Patient Care Team: [name], MD as PCP - General (Internal Medicine) [name], MD as PCP - Advanced Heart Failure (Cardiology) Lorretta Harp, MD as PCP - Cardiology (Cardiology) Magrinat, [name], MD as Consulting Physician (Hematology and Oncology) Arta Silence, MD as Consulting Physician (Gastroenterology) [name], MD as Consulting Physician (Cardiology) Marzetta Board, DPM as Consulting Physician (Podiatry) Gavin Pound, MD as Consulting Physician (Rheumatology) Jon Billings, RN as [place] [facility] OTHER MD:   CHIEF COMPLAINT: anemia of renal failure; vasculitis  CURRENT TREATMENT: [Feraheme], Retacrit, rituximab   INTERVAL HISTORY: [name] returns today for follow-up and treatment of her multifactorial anemia and vasculitis.  She receives Retacrit every 4 weeks, and rituximab every 8 weeks.  She is tolerating these well and says she feels like she is regaining her energy.     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  She isn't making any clothes for others, since that is draining.  She is going to see a new kidney doctor on 04/08/2020.  She cannot recall who this is.    [name] is experiencing hair thinning and this is particularly bothersome to her.  She wants to know what to do about this.  She has some mild diarrhea, and relates this to her dietary intake of certain foods.  She is drinking cherry juice daily to help prevent UTIs.    Otherwise, [name] is doing well and a detailed ROS was non contributory.    HISTORY OF CURRENT ILLNESS: From the original consult note:  [name] is an 79 y.o. female from [place], [place].  She has a past medical history significant forPAD s/p stenting; moderate to severe pulmonary HTN; afib and AVR on  Coumadin; HTN; HLD; DM; and CAD s/p CABG. the patient was admitted to the hospital for anemia.  She was having increasing fatigue and shortness of breath and had routine lab work drawn which showed a hemoglobin of 6.6.  Due to her abnormal lab work, she was advised to come to the emergency room for admission.  She was also admitted in February 2020 for symptomatic anemia.  She reports she has been anemic for at least a year and has been taking oral iron.  She underwent a colonoscopy and EGD on 01/22/2018.  The colonoscopy showed diverticulosis in the sigmoid colon and descending colon, internal hemorrhoids.  The upper endoscopy was normal except for erythematous mucosa in the antrum.  She had a repeat EGD on 11/21/2018 that showed a few minimally oozing( with scope trauma) superficial gastric ulcers with pigmented material were found in the gastric body. The largest lesion was 4 mm in largest dimension.  She underwent another upper endoscopy on 02/27/2019 which showed patchy mildly friable mucosa with contact bleeding was found in the gastric fundus, in the gastric body and in the gastric antrum.  The patient is maintained on warfarin due to A. fib and a mechanical aortic valve.  Review of the patient's chart shows that she has been anemic dating back to at least December thousand 12 (these are the earliest labs available to me).  Her highest hemoglobin was 11.6 in December 2012.  I also note that the patient has had intermittent mild leukopenia adding back to at least April 2019.  Her lowest white blood cell count was 2.6 in April  2019.  Since admission, the patient has received 1 unit of packed red blood cells.  She is currently on heparin for her procedure with plan to bridge back to Coumadin.  Stool for occult blood was negative x1 on 02/24/2019.  Hematology was asked see the patient to make recommendations regarding her anemia.  [name] subsequent history is as detailed below.   PAST MEDICAL  HISTORY: Past Medical History:  Diagnosis Date  . Aortic atherosclerosis ([facility]) 01/23/2018  . Atrial fibrillation ([facility])   . Carotid artery disease ([facility])   . Cholelithiasis 01/23/2018  . Chronic anticoagulation   . Coronary artery disease    status post coronary artery bypass grafting times 07/10/2004  . Diabetes mellitus   . Hypercholesteremia   . Hypertension   . Mechanical heart valve present    H. aortic valve replacement at the time of bypass surgery October 2005  . Moderate to severe pulmonary hypertension ([facility])   . Peripheral arterial disease ([facility])    history of left common iliac artery PTA and stenting for a chronic total occlusion 08/26/01    PAST SURGICAL HISTORY: Past Surgical History:  Procedure Laterality Date  . AORTIC VALVE REPLACEMENT    . CARDIAC CATHETERIZATION  11/10/2004   40% right common illiac, 70% in stent restenosis of distal left common illiac,   . CARDIAC CATHETERIZATION  05/18/2004   LAD 50-70% midstenosis, RCA dominant w/50% stenosis, 50% Right common Illiac artery ostial stenosis, 90% in stent restenosis within midportion of left common illiac stent  . Carotid Duplex  03/12/2012   RSA-elev. velocities suggestive of a 50-69% diameter reduction, Right&Left Bulb/Prox ICA-mild-mod.fibrous plaqueelevating Velocities abnormal study.  . CHOLECYSTECTOMY N/A 03/01/2018   Procedure: LAPAROSCOPIC CHOLECYSTECTOMY;  Surgeon: Judeth Horn, MD;  Location: [facility];  Service: General;  Laterality: N/A;  . COLONOSCOPY WITH PROPOFOL N/A 01/22/2018   Procedure: COLONOSCOPY WITH PROPOFOL;  Surgeon: Wilford Corner, MD;  Location: [facility];  Service: Endoscopy;  Laterality: N/A;  . ESOPHAGOGASTRODUODENOSCOPY (EGD) WITH PROPOFOL N/A 01/22/2018   Procedure: ESOPHAGOGASTRODUODENOSCOPY (EGD) WITH PROPOFOL;  Surgeon: Wilford Corner, MD;  Location: [facility];  Service: Endoscopy;  Laterality: N/A;  . ESOPHAGOGASTRODUODENOSCOPY (EGD) WITH PROPOFOL N/A 11/21/2018   Procedure:  ESOPHAGOGASTRODUODENOSCOPY (EGD) WITH PROPOFOL;  Surgeon: [name], MD;  Location: [facility];  Service: Gastroenterology;  Laterality: N/A;  . ESOPHAGOGASTRODUODENOSCOPY (EGD) WITH PROPOFOL Left 02/27/2019   Procedure: ESOPHAGOGASTRODUODENOSCOPY (EGD) WITH PROPOFOL;  Surgeon: Arta Silence, MD;  [facility] END[facility]COPY;  Service: Endoscopy;  Laterality: Left;  . GIVENS CAPSULE STUDY N/A 11/21/2018   Procedure: GIVENS CAPSULE STUDY;  Surgeon: [name], MD;  Location: [facility];  Service: Gastroenterology;  Laterality: N/A;  To be deployed during EGD  . Lower Ext. Duplex  03/12/2012   Right Proximal CIA- vessel narrowing w/elevated velocities 0-49% diameter reduction. Right SFA-mild mixed density plaque throughout vessel.  [name] NM MYOCAR PERF WALL MOTION  05/19/2010   protocol: Persantine, post stress EF 65%, negative for ischemia, low risk scan  . RIGHT HEART CATH N/A 06/27/2018   Procedure: RIGHT HEART CATH;  Surgeon: [name], MD;  Location: [facility];  Service: Cardiovascular;  Laterality: N/A;  . TRANSTHORACIC ECHOCARDIOGRAM  08/29/2012   Moderately calcified annulus of mitral valve, moderate regurg. of both mitral valve and tricuspid valve.     FAMILY HISTORY: Family History  Problem Relation Age of Onset  . Breast cancer Neg Hx     GYNECOLOGIC HISTORY:  No LMP recorded. Patient has had a hysterectomy.  Menarche:  years old Age at first live birth:  [place] P: 3 LMP:  Contraceptive:  HRT:   Hysterectomy?: yes BSO?:    SOCIAL HISTORY: (Current as of 03/07/2019) [name] is widowed.  She lives by herself.  She has 3 children that live locally.  She smoked three quarters of a pack of cigarettes daily for 50 years.  She quit in 2005.  Denies alcohol use.   ADVANCED DIRECTIVES: Not in place    HEALTH MAINTENANCE: Social History   Tobacco Use  . Smoking status: Former Smoker    Packs/day: 1.00    Years: 30.00    Pack years: 30.00    Types: Cigarettes  .  Smokeless tobacco: Never Used  . Tobacco comment: quit smoking 2005  Vaping Use  . Vaping Use: Never used  Substance Use Topics  . Alcohol use: No    Alcohol/week: 0.0 standard drinks  . Drug use: No    Colonoscopy: EGD on 02/27/2019 under Dr. Paulita Fujita found some friable gastric mucosa.  Colonoscopy under Dr. Michail Sermon 01/22/2018 showed significant diverticular disease  PAP:   Bone density: May 2017 showed osteopenia Mammography: 02/25/2017, breast density category B.  Allergies  Allergen Reactions  . Flagyl [Metronidazole] Rash    ALL-OVER BODY RASH  . Coreg [Carvedilol] Other (See Comments)    Terrible cramping in the feet and had a lot of bowel movements, but not diarrhea  . Losartan Swelling    Patient doesn't recall site of swelling  . Verapamil Hives  . Zetia [Ezetimibe] Other (See Comments)    Reaction not recalled  . Zocor [Simvastatin - High Dose] Other (See Comments)    Reaction not recalled    Current Outpatient Medications  Medication Sig Dispense Refill  . acetaminophen (TYLENOL) 325 MG tablet Take 650 mg by mouth every 6 (six) hours as needed for mild pain.     [name] allopurinol (ZYLOPRIM) 100 MG tablet Take 100 mg by mouth daily.    [name] ALPRAZolam (XANAX) 0.5 MG tablet Take 0.25 mg by mouth at bedtime.     [name] amLODipine (NORVASC) 10 MG tablet Take 1 tablet (10 mg total) by mouth daily. 90 tablet 3  . atorvastatin (LIPITOR) 80 MG tablet Take 1 tablet (80 mg total) by mouth daily at 6 PM.    . BESIVANCE 0.6 % SUSP Place 1 drop into the right eye 3 (three) times daily.    . Cholecalciferol (VITAMIN D3) 1000 UNITS CAPS Take 2,000 Units by mouth daily with lunch.     . cloNIDine (CATAPRES) 0.2 MG tablet Take 1 tablet (0.2 mg total) by mouth 2 (two) times daily. 60 tablet 3  . colchicine 0.6 MG tablet     . Cyanocobalamin (VITAMIN B12) 1000 MCG TBCR Take 1,000 mcg by mouth daily with lunch.     . DUREZOL 0.05 % EMUL Place 1 drop into the right eye 3 (three) times daily.    .  furosemide (LASIX) 80 MG tablet Take 1 tablet (80 mg total) by mouth daily. 90 tablet 3  . gatifloxacin (ZYMAXID) 0.5 % SOLN Place 1 drop into the right eye 4 (four) times daily.    . hydrALAZINE (APRESOLINE) 100 MG tablet TAKE 1 TABLET THREE TIMES DAILY 270 tablet 3  . ketorolac (ACULAR) 0.5 % ophthalmic solution Place 1 drop into the right eye 4 (four) times daily.    [name] lactose free nutrition (BOOST) LIQD Take 237 mLs by mouth daily. (To supplement meals)    . magnesium  oxide (MAG-OX) 400 (241.3 Mg) MG tablet Take 1 tablet (400 mg total) by mouth daily.    . metFORMIN (GLUCOPHAGE) 500 MG tablet Take 500 mg by mouth 2 (two) times daily with a meal.     . metoprolol succinate (TOPROL XL) 25 MG 24 hr tablet Take 0.5 tablets (12.5 mg total) by mouth daily. 45 tablet 3  . pantoprazole (PROTONIX) 40 MG tablet Take 40 mg by mouth 2 (two) times daily.     [name] PROLENSA 0.07 % SOLN SMARTSIG:1 Drop(s) Right Eye Every Evening    . spironolactone (ALDACTONE) 25 MG tablet Take 1 tablet (25 mg total) by mouth daily. 90 tablet 3  . tadalafil, PAH, (ADCIRCA) 20 MG tablet Take 2 tablets (40 mg total) by mouth daily. 60 tablet 6  . tobramycin (TOBREX) 0.3 % ophthalmic solution     . vitamin C (ASCORBIC ACID) 500 MG tablet Take 500 mg by mouth daily with lunch.    . warfarin (COUMADIN) 5 MG tablet TAKE 1.5 TO 2 TABLETS BY MOUTH DAILY AS DIRECTED BY COUMADIN CLINIC 180 tablet 0   No current facility-administered medications for this visit.    OBJECTIVE:  white woman using a cane Vitals:   03/30/20 1006  BP: (!) 171/53  Pulse: (!) 50  Temp: (!) 97.4 F (36.3 C)  SpO2: 98%   Wt Readings from Last 3 Encounters:  03/30/20 125 lb 4.8 oz (56.8 kg)  03/04/20 124 lb 6.4 oz (56.4 kg)  02/02/20 123 lb 9.6 oz (56.1 kg)   Body mass index is 22.92 kg/m.    ECOG FS:2 - Symptomatic, <50% confined to bed GENERAL: Patient is a well appearing female in no acute distress HEENT:  Sclerae anicteric.  Mask in place. Neck  is supple.  NODES:  No cervical, supraclavicular, or axillary lymphadenopathy palpated.  BREAST EXAM:  Deferred. LUNGS:  Clear to auscultation bilaterally.  No wheezes or rhonchi. HEART:  Regular rate and rhythm, +systolic murmur and mechanical S2. No murmur appreciated. ABDOMEN:  Soft, nontender.  Positive, normoactive bowel sounds. No organomegaly palpated. MSK:  No focal spinal tenderness to palpation. Full range of motion bilaterally in the upper extremities. EXTREMITIES:  No peripheral edema.   SKIN:  Clear with no obvious rashes or skin changes. No nail dyscrasia. NEURO:  Nonfocal. Well oriented.  Appropriate affect.   LAB RESULTS:  CMP     Component Value Date/Time   NA 133 (L) 03/30/2020 0932   NA 137 12/06/2017 1436   K 4.4 03/30/2020 0932   CL 103 03/30/2020 0932   CO2 20 (L) 03/30/2020 0932   GLUCOSE 152 (H) 03/30/2020 0932   BUN 57 (H) 03/30/2020 0932   BUN 20 12/06/2017 1436   CREATININE 1.73 (H) 03/30/2020 0932   CALCIUM 9.3 03/30/2020 0932   PROT 6.3 (L) 03/30/2020 0932   ALBUMIN 3.6 03/30/2020 0932   AST 21 03/30/2020 0932   ALT 16 03/30/2020 0932   ALKPHOS 83 03/30/2020 0932   BILITOT 0.4 03/30/2020 0932   GFRNONAA 27 (L) 03/30/2020 0932   GFRAA 32 (L) 03/30/2020 0932    Lab Results  Component Value Date   TOTALPROTELP 6.9 05/07/2019    No results found for: KPAFRELGTCHN, LAMBDASER, [facility]  Lab Results  Component Value Date   WBC 5.3 03/30/2020   NEUTROABS 3.4 03/30/2020   HGB 10.8 (L) 03/30/2020   HCT 33.1 (L) 03/30/2020   MCV 95.1 03/30/2020   PLT 152 03/30/2020    No results found for:  LABCA2  No components found for: [id number]  Recent Labs  Lab 03/25/20 1025  INR 1.7*    No results found for: LABCA2  No results found for: WNU272  No results found for: ZDG644  No results found for: IHK742  No results found for: CA2729  No components found for: HGQUANT  No results found for: CEA1 / No results found for: CEA1   No  results found for: AFPTUMOR  No results found for: CHROMOGRNA  No results found for: HGBA, HGBA2QUANT, HGBFQUANT, HGBSQUAN (Hemoglobinopathy evaluation)   Lab Results  Component Value Date   LDH 212 (H) 08/04/2019    Lab Results  Component Value Date   IRON 154 (H) 03/01/2020   TIBC 278 03/01/2020   IRONPCTSAT 55 03/01/2020   (Iron and TIBC)  Lab Results  Component Value Date   FERRITIN 169 03/01/2020    Urinalysis    Component Value Date/Time   COLORURINE YELLOW 07/07/2019 0209   APPEARANCEUR CLOUDY (A) 07/07/2019 0209   LABSPEC 1.011 07/07/2019 0209   PHURINE 6.0 07/07/2019 0209   GLUCOSEU NEGATIVE 07/07/2019 0209   HGBUR NEGATIVE 07/07/2019 0209   BILIRUBINUR NEGATIVE 07/07/2019 0209   KETONESUR NEGATIVE 07/07/2019 0209   PROTEINUR >=300 (A) 07/07/2019 0209   UROBILINOGEN 1.0 10/06/2011 1709   NITRITE NEGATIVE 07/07/2019 0209   LEUKOCYTESUR LARGE (A) 07/07/2019 0209    STUDIES:  No results found.   ELIGIBLE FOR AVAILABLE RESEARCH PROTOCOL: no   ASSESSMENT: 79 y.o. [place] woman with multifactorial anemia, as follows:             (a) anemia of chronic inflammation: As of May 2020 she has a SED rate of 95, CRP 1.1, positive ANA and RF; subsequently she was found to be ANCA positive             (b) hemolysis (mild): she has a mildly elevated LDH and a DAT positive for complement, not IgG; this is c/w (a) above             (c) anemia of renal insufficiency: inadequate EPO resonse to anemia              (d) GIB/ iron deficiency in patient on lifelong anticoagulaton  (1) Feraheme received 03/01/2019, repeated 03/20/2019   (a) reticulocyte 03/07/2019 up from 43.4 a year ago to 191.8 after iron infusion  (b) feraheme repeated on 05/12/2019  (c) subsequent ferritin levels >100  (2) darbepoetin: starting 05/05/2019 at 168mcg given every 2 weeks  (a) dose increased to 200 mcg every week starting 07/01/2019   (b) back to every 2 weeks beginning 08/04/2019 still  at 200 mcg dose  (3) ANCA positive vasculitis: diagnosed 05/08/2019 (titer 1:640)  (a) prednisone 40 mg daily started 05/31/2019  (b) rituximab weekly started 06/12/2019 (received test dose 06/05/2019)   (i) hepatitis B sAg, core Ab and HIV negative 05/08/2019   (ii) rituximab held after 07/14/2019 dose (received 5 weekly doses) with neutropenia   (iii) rituximab resumed 08/18/2019 but changed to monthly   (iv) rituximab changed to every 2 months beginning with 02/02/2020 dose  (c) prednisone tapered to off as of 09/15/2019  (4) osteopenia with a T score of -1.5 on bone density 02/28/2016 at the breast center    PLAN: [name] is doing well today.  Her hemoglobin continues to improve and she is regaining her strength.  This is good news.  She will continue on treatment with Rituximab every 8 weeks, and retacrit every 4 weeks.  [name] has thinning hair.  I recommended Nioxin and a high quality salon shampoo.  I reviewed with her that purchasing these things can help with the thinning and prevent brittle hair, however they can be expensive.    [name] will let me know what the name of her nephrologist is when she returns here.  We will see her back in 4 weeks for labs, f/u, and her next injection.  She knows to call for any questions that may arise between now and her next appointment.  We are happy to see her sooner if needed.  Total encounter time 20 minutes.Wilber Bihari, NP 03/30/20 10:37 AM Medical Oncology and Hematology [facility] [place], [place] [address] Tel. [phone]    Fax. [phone]   *Total Encounter Time as defined by the Centers for Medicare and Medicaid Services includes, in addition to the face-to-face time of a patient visit (documented in the note above) non-face-to-face time: obtaining and reviewing outside history, ordering and reviewing medications, tests or procedures, care coordination (communications with other health  care professionals or caregivers) and documentation in the medical record.

## 2020-04-01 ENCOUNTER — Telehealth: Payer: Self-pay | Admitting: *Deleted

## 2020-04-01 ENCOUNTER — Other Ambulatory Visit: Payer: Self-pay | Admitting: *Deleted

## 2020-04-01 ENCOUNTER — Other Ambulatory Visit: Payer: Self-pay

## 2020-04-01 ENCOUNTER — Ambulatory Visit (HOSPITAL_COMMUNITY)
Admission: RE | Admit: 2020-04-01 | Discharge: 2020-04-01 | Disposition: A | Discharge status: Home / Self Care | Payer: Medicare HMO | Source: Ambulatory Visit | Attending: Internal Medicine | Admitting: Internal Medicine

## 2020-04-01 ENCOUNTER — Ambulatory Visit (HOSPITAL_BASED_OUTPATIENT_CLINIC_OR_DEPARTMENT_OTHER)
Admission: RE | Admit: 2020-04-01 | Discharge: 2020-04-01 | Disposition: A | Discharge status: Home / Self Care | Payer: Medicare HMO | Source: Ambulatory Visit | Attending: Internal Medicine | Admitting: Internal Medicine

## 2020-04-01 DIAGNOSIS — I739 Peripheral vascular disease, unspecified: Secondary | ICD-10-CM | POA: Diagnosis not present

## 2020-04-05 NOTE — Progress Notes (Signed)
Date:  04/06/2020   ID:  [name], DOB July 04, 1941, MRN [id number]    Provider location: [facility] Type of Visit: Established patient   PCP:  [name], MD  Cardiologist:  Quay Burow, MD Primary HF: Dr. Aundra Dubin  Chief Complaint: Shortness of breath   History of Present Illness: [name] is a 79 y.o. female who has a history of PAD, CAD s/p CABG, mechanical AVR, pulmonary hypertension, and chronic diastolic CHF with prominent RV dysfunction. Patient had CABG-AVR in 10/05.  She has been in chronic atrial fibrillation, it appears, since at least 4/17.  She has significant PAD followed by Dr. [name].    She was admitted in 5/19 with dyspnea and marked volume overloaded. She was started on Lasix and it was titrated up.  Echo showed EF 55-60% with moderate RV dilation, severe RV systolic dysfunction, and PASP 88 mmHg.  [place] in 9/19 showed moderate pulmonary hypertension with near normal filling pressures. PVR was not particularly high at 3.1 WU primarily due to elevated cardiac output. V/Q scan did not show chronic PEs and PFTs were unremarkable. She is on Adcirca.   She was admitted in 2/20 with upper GI bleeding, found to have gastric ulcers.  She had a CT chest in 3/20 with subtle bilateral ground glass opacities.  Referred to pulmonary.   She was admitted again in 6/20 with a GI bleed and required transfusion.   She was seen by pulmonary in 7/20, PFTs showed no obstruction/restriction but decreased DLCO.    She was seen by rheumatology due to concern for rheumatoid arthritis.  She was thought to have gout and OA but no RA.   Echo was done in 7/20 showing  EF 60-65% with mild LVH, normal RV, mechanical aortic valve with mean gradient 17 mmHg, PASP 34 mmHg.   In 8/20, she was admitted with CHF/pneumonitis and pancytopenia.  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  She has been  treated  with rituximab and prednisone, now just getting rituximab.   Last echo in 4/21 showed EF 65-70%, normal RV, moderate TR, mechanical aortic valve with mean gradient 20 mmHg, PASP 36 mmHg.   Today she returns for HF follow up. Last visit lasix was cut back to 80 mg daily and switched to toprol xl. Overall feeling fine. Says she is busy around her house. Mild shortness of breath when vacuuming. Denies PND/Orthopnea. Appetite ok. No chest pain. No bleeding issues. No fever or chills. Weight at home 125  pounds. Taking all medications.   Labs (8/19): K 4.2, creatinine 0.8, LDL 56 Labs (9/19): K 4.3, Na 129, creatinine 0.87, hgb 9.8 Labs (10/19): ANA+, RF 36.8 (elevated), SCL-70 negative, anti-centromere Ab negative, ds-DNA negative.  Labs (12/19): K 4, creatinine 1.09 Labs (1/20): CCP Ab elevated 115 Labs (2/20): K 4.1, creatinine 1.24 Labs (7/20): K 4.2, creatinine 1.99, hgb 6.5 (transfused) Labs (11/20): K 3.5, creatinine 1.64 Labs (12/20): LDL 60 Labs (5/21): K 4.4, creatinine 1.85 => 2.66 Labs (03/30/20): Creatinine 1.7   6 min walk (10/19): 189 m 6 min walk (1/20): 183 m  ECG (personally reviewed): NSR at 55  PMH: 1. Mechanical St Jude aortic valve 10/05: Goal INR 2.5-3.5.  2. CAD: s/p CABG x 2 with AVR in 10/05.  - Cardiolite in 2014 showed no ischemia.  3. HTN 4. Hyperlipidemia 5. Type 2 diabetes 6. PAD: Peripheral arterial dopplers (7/19) with >50% CIA  stenosis on right, 70-99% CIA stenosis on the left.  7. H/o CCY 8. Atrial fibrillation: Chronic since at least 4/17. 9. Chronic diastolic CHF with prominent RV dysfunction: Echo (5/19) with EF 82-95%, grade 3 diastolic dysfunction, mechanical aortic valve with mean gradient 10 mmHg, mild MR, moderate RV dilation with moderate-severely decreased RV systolic function, biatrial enlargement, PASP 88 mmHg.  - Echo (7/20): EF 60-65% with mild LVH, normal RV, mechanical aortic valve with mean gradient 17 mmHg, PASP 34 mmHg.  - Echo  (4/21): EF 65-70%, normal RV, moderate TR, mechanical aortic valve with mean gradient 20 mmHg, PASP 36 mmHg.  10. Carotid stenosis: 40-59% BICA stenosis on 7/19 carotid dopplers.  - Carotid dopplers (5/21): 40-59% BICA stenosis.  11. Pulmonary hypertension: RHC (9/19) with mean RA 5, PA 63/17 mean 36, mean PCWP 16, CI 4.16, PVR 3.1 WU.  - V/Q scan (10/19): no evidence for chronic PE.  - PFTs (10/19): minimal obstruction.  - ANA+, RF 36.8 (elevated), SCL-70 negative, anti-centromere Ab negative, ds-DNA negative, CCP Ab positive. She saw rheumatology, NOT thought to have RA.  - Echo in 7/20 with normal-appearing RV and PASP 34 mmHg.  - PFTs (7/20) with no significant obstruction/restriction but decreased DLCO.  - PYP scan not suggestive of TTR amyloidosis.  - Echo (4/21): EF 65-70%, normal RV, moderate TR, mechanical aortic valve with mean gradient 20 mmHg, PASP 36 mmHg.  12. 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 History of GI bleeding.  15. 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  - Getting Rituximab and prednisone.  16. CKD: stage 3.   Current Outpatient Medications  Medication Sig Dispense Refill  . acetaminophen (TYLENOL) 325 MG tablet Take 650 mg by mouth every 6 (six) hours as needed for mild pain.     [name] allopurinol (ZYLOPRIM) 100 MG tablet Take 100 mg by mouth daily.    [name] ALPRAZolam (XANAX) 0.5 MG tablet Take 0.25 mg by mouth at bedtime.     [name] amLODipine (NORVASC) 10 MG tablet Take 1 tablet (10 mg total) by mouth daily. 90 tablet 3  . atorvastatin (LIPITOR) 80 MG tablet Take 1 tablet (80 mg total) by mouth daily at 6 PM.    . BESIVANCE 0.6 % SUSP Place 1 drop into the right eye 3 (three) times daily.    . Cholecalciferol (VITAMIN D3) 1000 UNITS CAPS Take 2,000 Units by mouth daily with lunch.     . cloNIDine (CATAPRES) 0.2 MG tablet Take 1 tablet (0.2 mg total) by mouth 2 (two) times daily. 60 tablet 3  . colchicine 0.6 MG  tablet     . Cyanocobalamin (VITAMIN B12) 1000 MCG TBCR Take 1,000 mcg by mouth daily with lunch.     . DUREZOL 0.05 % EMUL Place 1 drop into the right eye 3 (three) times daily.    . furosemide (LASIX) 80 MG tablet Take 1 tablet (80 mg total) by mouth daily. 90 tablet 3  . gatifloxacin (ZYMAXID) 0.5 % SOLN Place 1 drop into the right eye 4 (four) times daily.    . hydrALAZINE (APRESOLINE) 100 MG tablet TAKE 1 TABLET THREE TIMES DAILY 270 tablet 3  . ketorolac (ACULAR) 0.5 % ophthalmic solution Place 1 drop into the right eye 4 (four) times daily.    . magnesium oxide (MAG-OX) 400 (241.3 Mg) MG tablet Take 1 tablet (400 mg total) by mouth daily.    . metFORMIN (GLUCOPHAGE) 500 MG tablet Take 500 mg by mouth 2 (two) times  daily with a meal.     . metoprolol succinate (TOPROL XL) 25 MG 24 hr tablet Take 0.5 tablets (12.5 mg total) by mouth daily. 45 tablet 3  . pantoprazole (PROTONIX) 40 MG tablet Take 40 mg by mouth 2 (two) times daily.     [name] PROLENSA 0.07 % SOLN SMARTSIG:1 Drop(s) Right Eye Every Evening    . spironolactone (ALDACTONE) 25 MG tablet Take 1 tablet (25 mg total) by mouth daily. 90 tablet 3  . tadalafil, PAH, (ADCIRCA) 20 MG tablet Take 2 tablets (40 mg total) by mouth daily. 60 tablet 6  . tobramycin (TOBREX) 0.3 % ophthalmic solution     . vitamin C (ASCORBIC ACID) 500 MG tablet Take 500 mg by mouth daily with lunch.    . warfarin (COUMADIN) 5 MG tablet TAKE 1.5 TO 2 TABLETS BY MOUTH DAILY AS DIRECTED BY COUMADIN CLINIC 180 tablet 0  . lactose free nutrition (BOOST) LIQD Take 237 mLs by mouth daily. (To supplement meals) (Patient not taking: Reported on 04/06/2020)     No current facility-administered medications for this encounter.    Allergies:   Flagyl [metronidazole], Coreg [carvedilol], Losartan, Verapamil, Zetia [ezetimibe], Zocor [simvastatin - high dose], and Gatifloxacin   Social History:  The patient  reports that she has quit smoking. Her smoking use included  cigarettes. She has a 30.00 pack-year smoking history. She has never used smokeless tobacco. She reports that she does not drink alcohol and does not use drugs.   Family History:  [name] family history is not on file.   ROS:  Please see the history of present illness.   All other systems are personally reviewed and negative.   Exam:   BP (!) 148/52   Pulse (!) 58   Wt 57.8 kg (127 lb 8 oz)   SpO2 97%   BMI 23.32 kg/m    ReDS Vest / Clip - 04/06/20 0900      ReDS Vest / Clip   Station Marker B    Ruler Value 26    ReDS Value Range Low volume    ReDS Actual Value 34           General:  Well appearing. No resp difficulty HEENT: normal Neck: supple. no JVD. Carotids 2+ bilat; no bruits. No lymphadenopathy or thryomegaly appreciated. Cor: PMI nondisplaced. Regular rate & rhythm. No rubs, gallops or murmurs. Lungs: clear Abdomen: soft, nontender, nondistended. No hepatosplenomegaly. No bruits or masses. Good bowel sounds. Extremities: no cyanosis, clubbing, rash, edema Neuro: alert & orientedx3, cranial nerves grossly intact. moves all 4 extremities w/o difficulty. Affect pleasant  Recent Labs: 05/30/2019: Magnesium 2.1 07/07/2019: B Natriuretic Peptide 311.8 03/30/2020: ALT 16; BUN 57; Creatinine 1.73; Hemoglobin 10.8; Platelets 152; Potassium 4.4; Sodium 133  Personally reviewed   Wt Readings from Last 3 Encounters:  04/06/20 57.8 kg (127 lb 8 oz)  03/30/20 56.8 kg (125 lb 4.8 oz)  03/04/20 56.4 kg (124 lb 6.4 oz)     ASSESSMENT AND PLAN:  1. Atrial fibrillation: Paroxysmal.  - Regular on exam.  -Continue  Toprol XL 12.5 mg daily.    - Continue warfarin, goal INR 2.5-3.5. No ASA with history of GI bleeding.  2. Chronic diastolic CHF: With prominent RV dysfunction and severe pulmonary hypertension by echo in 2019. Filling pressures were well-compensated on 9/19 RHC. Repeat echo in 7/20 showed normal RV size and systolic function and PA systolic pressure down to 34  mmHg.  PYP scan did not suggest ATTR  amyloidosis.  Echo in 4/21 was similar to 7/20.   Reds Clip 34%.  NYHA III. Volume status stable. Continue Lasix to 80 mg daily. - Recent BMET stable.  3. CAD: No chest pain.   - Continue atorvastatin and warfarin.  4. PAD: Significant on 7/19 dopplers but no claudication, no pedal ulcerations. Followed by Dr. [name].  5. Mechanical aortic valve: Mean gradient mildly higher at 20 mmHg on 4/21 echo.  Given co-existing atrial fibrillation, goal INR is 2.5-3.5.  - Continue warfarin.  - No ASA with GI bleeding.  6. Pulmonary hypertension: Moderate pulmonary hypertension on 9/19 with RV dysfunction and dilation noted on last echo. PVR only 3.1 WU but cardiac output was high.  V/Q scan in 10/19 with no evidence for chronic PEs and PFTs in 10/19 and again in 7/20 were unremarkable except for low DLCO.  I think there is a possible group 1 PH component though this is a borderline call as PVR is not significantly elevated. ANA and RF positive, as is CCP Ab.  She saw rheumatology and per her report, was not thought to have rheumatoid arthritis.  Subsequently, she has been diagnosed with p-ANCA+ vasculitis. CT chest showed subtle bilateral ground glass opacities in 2020, she is followed by pulmonary.    - Continue Adcirca 40 mg daily.  It was borderline to start this,  not sure she has had marked benefit.   Will hold off on starting a 2nd [place] medication for now.  Repeat echo in 4/21 showed normal RV and estimated PA systolic pressure 36 mmHg.  7. Rheumatology: Joint pain, positive RF and CCP Ab.  Lung findings (ground glass, pulmonary hypertension) also thought to be concerning for RA. However, per patient's report, she was by her rheumatologist that did not have RA and did have gout.  More recently, she has been found to have p-ANCA+ vasculitis, eosinophilic granulomatosis with polyangiitis.  She is being treated with rituximab and prednisone by Dr Jana Hakim.   8. HTN: BP  controlled on current antihypertensive regimen.  9. CKD: Stage 3.   - She was referred to nehrology, ?involvement by granulomatosis with polyangiitis.   - Creatinine back down to 1.7 on BMET from 03/30/20  Follow up in 3 months with Dr Aundra Dubin. Jeanmarie Hubert, NP  04/06/2020   [place] [address] [facility] [place] [address] [phone] (office) [phone] (fax)

## 2020-04-06 ENCOUNTER — Other Ambulatory Visit: Payer: Self-pay

## 2020-04-06 ENCOUNTER — Ambulatory Visit (HOSPITAL_COMMUNITY)
Admission: RE | Admit: 2020-04-06 | Discharge: 2020-04-06 | Disposition: A | Discharge status: Home / Self Care | Payer: Medicare HMO | Source: Ambulatory Visit | Attending: Adult Health | Admitting: Adult Health

## 2020-04-06 VITALS — BP 148/52 | HR 58 | Wt 127.5 lb

## 2020-04-06 DIAGNOSIS — E1151 Type 2 diabetes mellitus with diabetic peripheral angiopathy without gangrene: Secondary | ICD-10-CM | POA: Insufficient documentation

## 2020-04-06 DIAGNOSIS — I5032 Chronic diastolic (congestive) heart failure: Secondary | ICD-10-CM | POA: Diagnosis not present

## 2020-04-06 DIAGNOSIS — Z8711 Personal history of peptic ulcer disease: Secondary | ICD-10-CM | POA: Insufficient documentation

## 2020-04-06 DIAGNOSIS — N183 Chronic kidney disease, stage 3 unspecified: Secondary | ICD-10-CM | POA: Diagnosis not present

## 2020-04-06 DIAGNOSIS — I480 Paroxysmal atrial fibrillation: Secondary | ICD-10-CM

## 2020-04-06 DIAGNOSIS — Z881 Allergy status to other antibiotic agents status: Secondary | ICD-10-CM | POA: Diagnosis not present

## 2020-04-06 DIAGNOSIS — Z952 Presence of prosthetic heart valve: Secondary | ICD-10-CM | POA: Diagnosis not present

## 2020-04-06 DIAGNOSIS — I130 Hypertensive heart and chronic kidney disease with heart failure and stage 1 through stage 4 chronic kidney disease, or unspecified chronic kidney disease: Secondary | ICD-10-CM | POA: Diagnosis not present

## 2020-04-06 DIAGNOSIS — M109 Gout, unspecified: Secondary | ICD-10-CM | POA: Insufficient documentation

## 2020-04-06 DIAGNOSIS — E785 Hyperlipidemia, unspecified: Secondary | ICD-10-CM | POA: Diagnosis not present

## 2020-04-06 DIAGNOSIS — I2510 Atherosclerotic heart disease of native coronary artery without angina pectoris: Secondary | ICD-10-CM | POA: Diagnosis not present

## 2020-04-06 DIAGNOSIS — Z87891 Personal history of nicotine dependence: Secondary | ICD-10-CM | POA: Diagnosis not present

## 2020-04-06 DIAGNOSIS — Z888 Allergy status to other drugs, medicaments and biological substances status: Secondary | ICD-10-CM | POA: Insufficient documentation

## 2020-04-06 DIAGNOSIS — E1122 Type 2 diabetes mellitus with diabetic chronic kidney disease: Secondary | ICD-10-CM | POA: Insufficient documentation

## 2020-04-06 DIAGNOSIS — Z8719 Personal history of other diseases of the digestive system: Secondary | ICD-10-CM | POA: Insufficient documentation

## 2020-04-06 DIAGNOSIS — Z7984 Long term (current) use of oral hypoglycemic drugs: Secondary | ICD-10-CM | POA: Insufficient documentation

## 2020-04-06 DIAGNOSIS — Z7901 Long term (current) use of anticoagulants: Secondary | ICD-10-CM | POA: Insufficient documentation

## 2020-04-06 DIAGNOSIS — Z79899 Other long term (current) drug therapy: Secondary | ICD-10-CM | POA: Insufficient documentation

## 2020-04-06 DIAGNOSIS — Z791 Long term (current) use of non-steroidal anti-inflammatories (NSAID): Secondary | ICD-10-CM | POA: Insufficient documentation

## 2020-04-06 DIAGNOSIS — Z951 Presence of aortocoronary bypass graft: Secondary | ICD-10-CM | POA: Insufficient documentation

## 2020-04-06 DIAGNOSIS — I158 Other secondary hypertension: Secondary | ICD-10-CM

## 2020-04-06 DIAGNOSIS — M301 Polyarteritis with lung involvement [Churg-Strauss]: Secondary | ICD-10-CM | POA: Diagnosis not present

## 2020-04-06 DIAGNOSIS — I272 Pulmonary hypertension, unspecified: Secondary | ICD-10-CM | POA: Insufficient documentation

## 2020-04-06 NOTE — Progress Notes (Signed)
ReDS Vest / Clip - 04/06/20 0900      ReDS Vest / Clip   Station Marker B    Ruler Value 26    ReDS Value Range Low volume    ReDS Actual Value 34

## 2020-04-06 NOTE — Patient Instructions (Signed)
It was great to see you today! No medication changes are needed at this time.   Your physician recommends that you schedule a follow-up appointment in: 3 months with Dr McLean  Do the following things EVERYDAY: 1) Weigh yourself in the morning before breakfast. Write it down and keep it in a log. 2) Take your medicines as prescribed 3) Eat low salt foods--Limit salt (sodium) to 2000 mg per day.  4) Stay as active as you can everyday 5) Limit all fluids for the day to less than 2 liters  At [facility], you and your health needs are our priority. As part of our continuing mission to provide you with exceptional heart care, we have created designated Provider Care Teams. These Care Teams include your primary Cardiologist (physician) and Advanced Practice Providers (APPs- Physician Assistants and Nurse Practitioners) who all work together to provide you with the care you need, when you need it.   You may see any of the following providers on your designated Care Team at your next follow up: . Dr Daniel Bensimhon . Dr Dalton McLean . Amy Clegg, NP . Brittainy Simmons, PA . Lauren Kemp, PharmD   Please be sure to bring in all your medications bottles to every appointment.     

## 2020-04-08 DIAGNOSIS — N184 Chronic kidney disease, stage 4 (severe): Secondary | ICD-10-CM | POA: Diagnosis not present

## 2020-04-08 DIAGNOSIS — I5032 Chronic diastolic (congestive) heart failure: Secondary | ICD-10-CM | POA: Diagnosis not present

## 2020-04-08 DIAGNOSIS — M109 Gout, unspecified: Secondary | ICD-10-CM | POA: Diagnosis not present

## 2020-04-08 DIAGNOSIS — E1122 Type 2 diabetes mellitus with diabetic chronic kidney disease: Secondary | ICD-10-CM | POA: Diagnosis not present

## 2020-04-08 DIAGNOSIS — I776 Arteritis, unspecified: Secondary | ICD-10-CM | POA: Diagnosis not present

## 2020-04-08 DIAGNOSIS — I129 Hypertensive chronic kidney disease with stage 1 through stage 4 chronic kidney disease, or unspecified chronic kidney disease: Secondary | ICD-10-CM | POA: Diagnosis not present

## 2020-04-08 DIAGNOSIS — D631 Anemia in chronic kidney disease: Secondary | ICD-10-CM | POA: Diagnosis not present

## 2020-04-14 ENCOUNTER — Ambulatory Visit: Payer: Medicare HMO | Admitting: Cardiovascular Disease

## 2020-04-14 NOTE — Telephone Encounter (Signed)
No entry 

## 2020-04-19 DIAGNOSIS — H2512 Age-related nuclear cataract, left eye: Secondary | ICD-10-CM | POA: Diagnosis not present

## 2020-04-23 ENCOUNTER — Other Ambulatory Visit: Payer: Self-pay

## 2020-04-23 ENCOUNTER — Ambulatory Visit (INDEPENDENT_AMBULATORY_CARE_PROVIDER_SITE_OTHER): Payer: Medicare HMO | Admitting: Pharmacist

## 2020-04-23 DIAGNOSIS — Z7901 Long term (current) use of anticoagulants: Secondary | ICD-10-CM | POA: Diagnosis not present

## 2020-04-23 DIAGNOSIS — Z952 Presence of prosthetic heart valve: Secondary | ICD-10-CM | POA: Diagnosis not present

## 2020-04-23 LAB — POCT INR: INR: 1.6 — AB (ref 2.0–3.0)

## 2020-04-25 NOTE — Progress Notes (Addendum)
[facility]  [phone] [phone]) [phone]    ID: [name] DOB: 08/07/1941  MR#: [id number]  EZM#:[id number]  Patient Care Team: [name], MD as PCP - General (Internal Medicine) [name], MD as PCP - Advanced Heart Failure (Cardiology) Lorretta Harp, MD as PCP - Cardiology (Cardiology) Magrinat, [name], MD as Consulting Physician (Hematology and Oncology) Arta Silence, MD as Consulting Physician (Gastroenterology) [name], MD as Consulting Physician (Cardiology) Marzetta Board, DPM as Consulting Physician (Podiatry) Gavin Pound, MD as Consulting Physician (Rheumatology) Jon Billings, RN as [place] Management OTHER MD:   CHIEF COMPLAINT: anemia of renal failure; vasculitis  CURRENT TREATMENT: [Feraheme], Retacrit, rituximab   INTERVAL HISTORY: [name] returns today for follow-up and treatment of her multifactorial anemia and vasculitis.  She receives Retacrit every 4 weeks, and rituximab every 8 weeks.  She is tolerating these well and says she feels like she is regaining her energy.    She is due for retacrit today.  Her hemoglobin remains above 10 and her ferritin level has remained above 100.     REVIEW OF SYSTEMS: [name] continues to remain active by sewing.  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  She is experiencing a mild weight gain, a decreased heart rate, and occasional dizziness that is brief.  She has had no falls.  She is seen by Dr. Aundra Dubin in cardiology for her h/o Afib and CHF.  She is taking metoprolol XR daily.    [name] denies any fever, chills, chest pain, palpitations, bowel/bladder changes, or nausea or vomiting.  A detailed ROS was otherwise non contributory.    HISTORY OF CURRENT ILLNESS: From the original consult note:  [name] is an 79 y.o. female from [place], [place].  She has a past medical history  significant forPAD s/p stenting; moderate to severe pulmonary HTN; afib and AVR on Coumadin; HTN; HLD; DM; and CAD s/p CABG. the patient was admitted to the hospital for anemia.  She was having increasing fatigue and shortness of breath and had routine lab work drawn which showed a hemoglobin of 6.6.  Due to her abnormal lab work, she was advised to come to the emergency room for admission.  She was also admitted in February 2020 for symptomatic anemia.  She reports she has been anemic for at least a year and has been taking oral iron.  She underwent a colonoscopy and EGD on 01/22/2018.  The colonoscopy showed diverticulosis in the sigmoid colon and descending colon, internal hemorrhoids.  The upper endoscopy was normal except for erythematous mucosa in the antrum.  She had a repeat EGD on 11/21/2018 that showed a few minimally oozing( with scope trauma) superficial gastric ulcers with pigmented material were found in the gastric body. The largest lesion was 4 mm in largest dimension.  She underwent another upper endoscopy on 02/27/2019 which showed patchy mildly friable mucosa with contact bleeding was found in the gastric fundus, in the gastric body and in the gastric antrum.  The patient is maintained on warfarin due to A. fib and a mechanical aortic valve.  Review of the patient's chart shows that she has been anemic dating back to at least December thousand 12 (these are the earliest labs available to me).  Her highest hemoglobin was 11.6 in December 2012.  I also note that the patient has had intermittent mild leukopenia adding back to at least April 2019.  Her lowest white blood cell count was 2.6 in April 2019.  Since admission, the patient has received 1 unit of packed red blood cells.  She is currently on heparin for her procedure with plan to bridge back to Coumadin.  Stool for occult blood was negative x1 on 02/24/2019.  Hematology was asked see the patient to make recommendations regarding her  anemia.  [name] subsequent history is as detailed below.   PAST MEDICAL HISTORY: Past Medical History:  Diagnosis Date  . Aortic atherosclerosis ([facility]) 01/23/2018  . Atrial fibrillation ([facility])   . Carotid artery disease ([facility])   . Cholelithiasis 01/23/2018  . Chronic anticoagulation   . Coronary artery disease    status post coronary artery bypass grafting times 07/10/2004  . Diabetes mellitus   . Hypercholesteremia   . Hypertension   . Mechanical heart valve present    H. aortic valve replacement at the time of bypass surgery October 2005  . Moderate to severe pulmonary hypertension ([facility])   . Peripheral arterial disease ([facility])    history of left common iliac artery PTA and stenting for a chronic total occlusion 08/26/01    PAST SURGICAL HISTORY: Past Surgical History:  Procedure Laterality Date  . AORTIC VALVE REPLACEMENT    . CARDIAC CATHETERIZATION  11/10/2004   40% right common illiac, 70% in stent restenosis of distal left common illiac,   . CARDIAC CATHETERIZATION  05/18/2004   LAD 50-70% midstenosis, RCA dominant w/50% stenosis, 50% Right common Illiac artery ostial stenosis, 90% in stent restenosis within midportion of left common illiac stent  . Carotid Duplex  03/12/2012   RSA-elev. velocities suggestive of a 50-69% diameter reduction, Right&Left Bulb/Prox ICA-mild-mod.fibrous plaqueelevating Velocities abnormal study.  . CHOLECYSTECTOMY N/A 03/01/2018   Procedure: LAPAROSCOPIC CHOLECYSTECTOMY;  Surgeon: Judeth Horn, MD;  Location: [facility];  Service: General;  Laterality: N/A;  . COLONOSCOPY WITH PROPOFOL N/A 01/22/2018   Procedure: COLONOSCOPY WITH PROPOFOL;  Surgeon: Wilford Corner, MD;  Location: [facility];  Service: Endoscopy;  Laterality: N/A;  . ESOPHAGOGASTRODUODENOSCOPY (EGD) WITH PROPOFOL N/A 01/22/2018   Procedure: ESOPHAGOGASTRODUODENOSCOPY (EGD) WITH PROPOFOL;  Surgeon: Wilford Corner, MD;  Location: [facility];  Service: Endoscopy;  Laterality:  N/A;  . ESOPHAGOGASTRODUODENOSCOPY (EGD) WITH PROPOFOL N/A 11/21/2018   Procedure: ESOPHAGOGASTRODUODENOSCOPY (EGD) WITH PROPOFOL;  Surgeon: [name], MD;  Location: [facility];  Service: Gastroenterology;  Laterality: N/A;  . ESOPHAGOGASTRODUODENOSCOPY (EGD) WITH PROPOFOL Left 02/27/2019   Procedure: ESOPHAGOGASTRODUODENOSCOPY (EGD) WITH PROPOFOL;  Surgeon: Arta Silence, MD;  Location[facility];  Service: Endoscopy;  Laterality: Left;  . GIVENS CAPSULE STUDY N/A 11/21/2018   Procedure: GIVENS CAPSULE STUDY;  Surgeon: [name], MD;  Location: [facility];  Service: Gastroenterology;  Laterality: N/A;  To be deployed during EGD  . Lower Ext. Duplex  03/12/2012   Right Proximal CIA- vessel narrowing w/elevated velocities 0-49% diameter reduction. Right SFA-mild mixed density plaque throughout vessel.  [name] Kitchen NM MYOCAR PERF WALL MOTION  05/19/2010   protocol: Persantine, post stress EF 65%, negative for ischemia, low risk scan  . RIGHT HEART CATH N/A 06/27/2018   Procedure: RIGHT HEART CATH;  Surgeon: [name], MD;  Location: [facility];  Service: Cardiovascular;  Laterality: N/A;  . TRANSTHORACIC ECHOCARDIOGRAM  08/29/2012   Moderately calcified annulus of mitral valve, moderate regurg. of both mitral valve and tricuspid valve.     FAMILY HISTORY: Family History  Problem Relation Age of Onset  . Breast cancer Neg Hx     GYNECOLOGIC  HISTORY:  No LMP recorded. Patient has had a hysterectomy. Menarche:  years old Age at first live birth:  [place] P: 3 LMP:  Contraceptive:  HRT:   Hysterectomy?: yes BSO?:    SOCIAL HISTORY: (Current as of 03/07/2019) [name] is widowed.  She lives by herself.  She has 3 children that live locally.  She smoked three quarters of a pack of cigarettes daily for 50 years.  She quit in 2005.  Denies alcohol use.   ADVANCED DIRECTIVES: Not in place    HEALTH MAINTENANCE: Social History   Tobacco Use  . Smoking status: Former Smoker     Packs/day: 1.00    Years: 30.00    Pack years: 30.00    Types: Cigarettes  . Smokeless tobacco: Never Used  . Tobacco comment: quit smoking 2005  Vaping Use  . Vaping Use: Never used  Substance Use Topics  . Alcohol use: No    Alcohol/week: 0.0 standard drinks  . Drug use: No    Colonoscopy: EGD on 02/27/2019 under Dr. Paulita Fujita found some friable gastric mucosa.  Colonoscopy under Dr. Michail Sermon 01/22/2018 showed significant diverticular disease  PAP:   Bone density: May 2017 showed osteopenia Mammography: 02/25/2017, breast density category B.  Allergies  Allergen Reactions  . Flagyl [Metronidazole] Rash    ALL-OVER BODY RASH  . Coreg [Carvedilol] Other (See Comments)    Terrible cramping in the feet and had a lot of bowel movements, but not diarrhea  . Losartan Swelling    Patient doesn't recall site of swelling  . Verapamil Hives  . Zetia [Ezetimibe] Other (See Comments)    Reaction not recalled  . Zocor [Simvastatin - High Dose] Other (See Comments)    Reaction not recalled  . Gatifloxacin Rash    Redness to skin around eye    Current Outpatient Medications  Medication Sig Dispense Refill  . acetaminophen (TYLENOL) 325 MG tablet Take 650 mg by mouth every 6 (six) hours as needed for mild pain.     [name] Kitchen allopurinol (ZYLOPRIM) 100 MG tablet Take 100 mg by mouth daily.    [name] Kitchen ALPRAZolam (XANAX) 0.5 MG tablet Take 0.25 mg by mouth at bedtime.     [name] Kitchen amLODipine (NORVASC) 10 MG tablet Take 1 tablet (10 mg total) by mouth daily. 90 tablet 3  . atorvastatin (LIPITOR) 80 MG tablet Take 1 tablet (80 mg total) by mouth daily at 6 PM.    . BESIVANCE 0.6 % SUSP Place 1 drop into the right eye 3 (three) times daily.    . Cholecalciferol (VITAMIN D3) 1000 UNITS CAPS Take 2,000 Units by mouth daily with lunch.     . cloNIDine (CATAPRES) 0.2 MG tablet Take 1 tablet (0.2 mg total) by mouth 2 (two) times daily. 60 tablet 3  . colchicine 0.6 MG tablet     . Cyanocobalamin (VITAMIN B12) 1000 MCG  TBCR Take 1,000 mcg by mouth daily with lunch.     . DUREZOL 0.05 % EMUL Place 1 drop into the right eye 3 (three) times daily.    . furosemide (LASIX) 80 MG tablet Take 1 tablet (80 mg total) by mouth daily. 90 tablet 3  . gatifloxacin (ZYMAXID) 0.5 % SOLN Place 1 drop into the right eye 4 (four) times daily.    . hydrALAZINE (APRESOLINE) 100 MG tablet TAKE 1 TABLET THREE TIMES DAILY 270 tablet 3  . ketorolac (ACULAR) 0.5 % ophthalmic solution Place 1 drop into the right eye 4 (four) times daily.    [name] Kitchen  lactose free nutrition (BOOST) LIQD Take 237 mLs by mouth daily. (To supplement meals) (Patient not taking: Reported on 04/06/2020)    . magnesium oxide (MAG-OX) 400 (241.3 Mg) MG tablet Take 1 tablet (400 mg total) by mouth daily.    . metFORMIN (GLUCOPHAGE) 500 MG tablet Take 500 mg by mouth 2 (two) times daily with a meal.     . metoprolol succinate (TOPROL XL) 25 MG 24 hr tablet Take 0.5 tablets (12.5 mg total) by mouth daily. 45 tablet 3  . pantoprazole (PROTONIX) 40 MG tablet Take 40 mg by mouth 2 (two) times daily.     [name] Kitchen PROLENSA 0.07 % SOLN SMARTSIG:1 Drop(s) Right Eye Every Evening    . spironolactone (ALDACTONE) 25 MG tablet Take 1 tablet (25 mg total) by mouth daily. 90 tablet 3  . tadalafil, PAH, (ADCIRCA) 20 MG tablet Take 2 tablets (40 mg total) by mouth daily. 60 tablet 6  . tobramycin (TOBREX) 0.3 % ophthalmic solution     . vitamin C (ASCORBIC ACID) 500 MG tablet Take 500 mg by mouth daily with lunch.    . warfarin (COUMADIN) 5 MG tablet TAKE 1.5 TO 2 TABLETS BY MOUTH DAILY AS DIRECTED BY COUMADIN CLINIC 180 tablet 0   No current facility-administered medications for this visit.    OBJECTIVE:  white woman using a cane There were no vitals filed for this visit. Wt Readings from Last 3 Encounters:  04/06/20 127 lb 8 oz (57.8 kg)  03/30/20 125 lb 4.8 oz (56.8 kg)  03/04/20 124 lb 6.4 oz (56.4 kg)   There is no height or weight on file to calculate BMI.    ECOG FS:2 -  Symptomatic, <50% confined to bed GENERAL: Patient is a well appearing female in no acute distress HEENT:  Sclerae anicteric.  Mask in place. Neck is supple.  NODES:  No cervical, supraclavicular, or axillary lymphadenopathy palpated.  BREAST EXAM:  Deferred. LUNGS:  Clear to auscultation bilaterally.  No wheezes or rhonchi. HEART:  Regular rhythm, decreased at 46 auscultated, +systolic murmur and mechanical S2.  ABDOMEN:  Soft, nontender.  Positive, normoactive bowel sounds. No organomegaly palpated. MSK:  No focal spinal tenderness to palpation. Full range of motion bilaterally in the upper extremities. EXTREMITIES:  Bilateral lower extremity edema noted   SKIN:  Clear with no obvious rashes or skin changes. No nail dyscrasia. NEURO:  Nonfocal. Well oriented.  Appropriate affect.   LAB RESULTS:  CMP     Component Value Date/Time   NA 133 (L) 03/30/2020 0932   NA 137 12/06/2017 1436   K 4.4 03/30/2020 0932   CL 103 03/30/2020 0932   CO2 20 (L) 03/30/2020 0932   GLUCOSE 152 (H) 03/30/2020 0932   BUN 57 (H) 03/30/2020 0932   BUN 20 12/06/2017 1436   CREATININE 1.73 (H) 03/30/2020 0932   CALCIUM 9.3 03/30/2020 0932   PROT 6.3 (L) 03/30/2020 0932   ALBUMIN 3.6 03/30/2020 0932   AST 21 03/30/2020 0932   ALT 16 03/30/2020 0932   ALKPHOS 83 03/30/2020 0932   BILITOT 0.4 03/30/2020 0932   GFRNONAA 27 (L) 03/30/2020 0932   GFRAA 32 (L) 03/30/2020 0932    Lab Results  Component Value Date   TOTALPROTELP 6.9 05/07/2019    No results found for: KPAFRELGTCHN, LAMBDASER, KAPLAMBRATIO  Lab Results  Component Value Date   WBC 5.3 03/30/2020   NEUTROABS 3.4 03/30/2020   HGB 10.8 (L) 03/30/2020   HCT 33.1 (L) 03/30/2020  MCV 95.1 03/30/2020   PLT 152 03/30/2020    No results found for: LABCA2  No components found for: OMBTDH741  Recent Labs  Lab 04/23/20 1055  INR 1.6*    No results found for: LABCA2  No results found for: ULA453  No results found for:  MIW803  No results found for: OZY248  No results found for: CA2729  No components found for: HGQUANT  No results found for: CEA1 / No results found for: CEA1   No results found for: AFPTUMOR  No results found for: CHROMOGRNA  No results found for: HGBA, HGBA2QUANT, HGBFQUANT, HGBSQUAN (Hemoglobinopathy evaluation)   Lab Results  Component Value Date   LDH 212 (H) 08/04/2019    Lab Results  Component Value Date   IRON 123 03/30/2020   TIBC 268 03/30/2020   IRONPCTSAT 46 03/30/2020   (Iron and TIBC)  Lab Results  Component Value Date   FERRITIN 169 03/30/2020    Urinalysis    Component Value Date/Time   COLORURINE YELLOW 07/07/2019 0209   APPEARANCEUR CLOUDY (A) 07/07/2019 0209   LABSPEC 1.011 07/07/2019 0209   PHURINE 6.0 07/07/2019 0209   GLUCOSEU NEGATIVE 07/07/2019 0209   HGBUR NEGATIVE 07/07/2019 0209   BILIRUBINUR NEGATIVE 07/07/2019 0209   KETONESUR NEGATIVE 07/07/2019 0209   PROTEINUR >=300 (A) 07/07/2019 0209   UROBILINOGEN 1.0 10/06/2011 1709   NITRITE NEGATIVE 07/07/2019 0209   LEUKOCYTESUR LARGE (A) 07/07/2019 0209    STUDIES:  VAS [place] ABI WITH/WO TBI  Result Date: 04/03/2020 LOWER EXTREMITY DOPPLER STUDY Indications: History of PAD with left common iliac artery intervention. Patient              denies any abdominal pain or claudication symptoms. She is              concerned with increase swelling to the lower left leg, with              reddish discoloration. She also would like to have her left great              toenail removed due to ingrown nail, but is concerned with healing. High Risk Factors: Hypertension, hyperlipidemia, Diabetes, past history of                    smoking, coronary artery disease.  Vascular Interventions: Left common iliac artery stenting in 08/2001. Comparison Study: In 04/2018, an arterial Doppler showed the ABIs to be                   non-compressible, bilaterally. Performing Technologist: Sharlett Iles RVT  Examination  Guidelines: A complete evaluation includes at minimum, Doppler waveform signals and systolic blood pressure reading at the level of bilateral brachial, anterior tibial, and posterior tibial arteries, when vessel segments are accessible. Bilateral testing is considered an integral part of a complete examination. Photoelectric Plethysmograph (PPG) waveforms and toe systolic pressure readings are included as required and additional duplex testing as needed. Limited examinations for reoccurring indications may be performed as noted.  ABI Findings: +---------+------------------+-----+--------+--------+ Right    Rt Pressure (mmHg)IndexWaveformComment  +---------+------------------+-----+--------+--------+ Brachial 168                                     +---------+------------------+-----+--------+--------+ ATA      254               1.50 biphasic         +---------+------------------+-----+--------+--------+  PTA      254               1.50 biphasic         +---------+------------------+-----+--------+--------+ PERO     254               1.50 biphasic         +---------+------------------+-----+--------+--------+ [name] Toe138               0.82 Abnormal         +---------+------------------+-----+--------+--------+ +---------+------------------+-----+--------+-------+ Left     Lt Pressure (mmHg)IndexWaveformComment +---------+------------------+-----+--------+-------+ Brachial 169                                    +---------+------------------+-----+--------+-------+ ATA      254               1.50 biphasic        +---------+------------------+-----+--------+-------+ PTA      254               1.50 biphasic        +---------+------------------+-----+--------+-------+ PERO     254               1.50 biphasic        +---------+------------------+-----+--------+-------+ [name] Toe121               0.72 Abnormal         +---------+------------------+-----+--------+-------+ +-------+-----------+-----------+------------+------------+ ABI/TBIToday's ABIToday's TBIPrevious ABIPrevious TBI +-------+-----------+-----------+------------+------------+ Right  [place]         .82        [place]          .91          +-------+-----------+-----------+------------+------------+ Left   [place]         .72        [place]          .78          +-------+-----------+-----------+------------+------------+ Bilateral ABIs and TBIs appear essentially unchanged compared to prior study on 04/29/2018.  Summary: Right: Resting right ankle-brachial index indicates noncompressible right lower extremity arteries. The right toe-brachial index is normal. Left: Resting left ankle-brachial index indicates noncompressible left lower extremity arteries. The left toe-brachial index is normal.  *See table(s) above for measurements and observations. See Aortoiliac duplex report. Vascular consult recommended. Electronically signed by Larae Grooms MD on 04/03/2020 at 8:04:34 AM.    Final    VAS US AORTA/IVC/ILIACS  Result Date: 04/02/2020 ABDOMINAL AORTA STUDY Indications: History of PAD with left common iliac artery intervention. Patient              denies any abdominal pain or claudication symptoms. She is              concerned with increase swelling to the lower left leg, with              reddish discoloration. She also would like to have her left great              toenail removed due to ingrown nail, but is concerned with healing.               Today's ABIs are non-compressible, bilaterally. Risk Factors: Hypertension, hyperlipidemia, Diabetes, past history of smoking,               coronary artery disease. Vascular Interventions: Left common iliac artery stenting in 08/2001. Limitations: Air/bowel gas.  Comparison  Study: In 04/2018, an aortoiliac duplex showed >50% stenosis in the                   right common iliac and left external iliac arteries. >50%                    restenosis in the left common iliac artery stent. Performing Technologist: Sharlett Iles RVT  Examination Guidelines: A complete evaluation includes B-mode imaging, spectral Doppler, color Doppler, and power Doppler as needed of all accessible portions of each vessel. Bilateral testing is considered an integral part of a complete examination. Limited examinations for reoccurring indications may be performed as noted.  Abdominal Aorta Findings: +-------------+-------+----------+----------+--------+--------+--------+ Location     AP (cm)Trans (cm)PSV (cm/s)WaveformThrombusComments +-------------+-------+----------+----------+--------+--------+--------+ Proximal     1.90             105                                +-------------+-------+----------+----------+--------+--------+--------+ Mid          1.90   2.10      83                                 +-------------+-------+----------+----------+--------+--------+--------+ Distal       1.90   2.10      73                                 +-------------+-------+----------+----------+--------+--------+--------+ RT CIA Prox                   314       biphasic                 +-------------+-------+----------+----------+--------+--------+--------+ RT CIA Mid                    425       biphasic                 +-------------+-------+----------+----------+--------+--------+--------+ RT CIA Distal                 224       biphasic                 +-------------+-------+----------+----------+--------+--------+--------+ RT EIA Prox                   108       biphasic                 +-------------+-------+----------+----------+--------+--------+--------+ RT EIA Mid                    152       biphasic                 +-------------+-------+----------+----------+--------+--------+--------+ RT EIA Distal                 104       biphasic                  +-------------+-------+----------+----------+--------+--------+--------+ LT EIA Prox                   267       biphasic                 +-------------+-------+----------+----------+--------+--------+--------+  LT EIA Mid                    171       biphasic        blunted  +-------------+-------+----------+----------+--------+--------+--------+ LT EIA Distal                 140       biphasic        blunted  +-------------+-------+----------+----------+--------+--------+--------+ Aortoiliac atherosclerosis with increase velocities noted in the bilateral common iliac arteries, s/p left common iliac artery stent placement. Abnormal tapering of the abdominal aorta is evident, without aneurysm. Stable >50% stenosis in the left proximal external iliac artery. IVC/Iliac Findings: +--------+------+--------+--------+   IVC   PatentThrombusComments +--------+------+--------+--------+ IVC Proxpatent                 +--------+------+--------+--------+   Left Stent(s): +-------------------+--------+---------------+--------+---------------+ Common Iliac ArteryPSV cm/sStenosis       WaveformComments        +-------------------+--------+---------------+--------+---------------+ Prox to Stent      93                     biphasic                +-------------------+--------+---------------+--------+---------------+ Proximal Stent     248                    biphasic                +-------------------+--------+---------------+--------+---------------+ Mid Stent          212                    biphasic                +-------------------+--------+---------------+--------+---------------+ Distal Stent       289     50-99% stenosisbiphasic>50% restenosis +-------------------+--------+---------------+--------+---------------+ Distal to Stent    271                    biphasic                +-------------------+--------+---------------+--------+---------------+ Stent  struts are not well visualized. Increase velocities are noted throughout the common iliac artery when compared to prior exam, s/p stent placement.  Summary: Abdominal Aorta: No evidence of an abdominal aortic aneurysm was visualized. The largest aortic measurement is 2.1 cm. Abnormal tapering of the abdominal aorta is evident. Stenosis: +-------------------+-------------+---------------+----------------------------+ Location           Stenosis     Stent          Comments                     +-------------------+-------------+---------------+----------------------------+ Right Common Iliac >50% stenosis               Increase in velocities                                                      compared to prior exam       +-------------------+-------------+---------------+----------------------------+ Left Common Iliac               50-99% stenosis>50% restenosis, increase in  velocities compared to prior                                                exam                         +-------------------+-------------+---------------+----------------------------+ Left External Iliac>50% stenosis               Stable velocities            +-------------------+-------------+---------------+----------------------------+  IVC/Iliac: Patent IVC.  *See table(s) above for measurements and observations. See ABI report. Vascular consult recommended.  Electronically signed by Larae Grooms MD on 04/02/2020 at 8:05:47 AM.    Final      ELIGIBLE FOR AVAILABLE RESEARCH PROTOCOL: no   ASSESSMENT: 79 y.o. [name] woman with multifactorial anemia, as follows:             (a) anemia of chronic inflammation: As of May 2020 she has a SED rate of 95, CRP 1.1, positive ANA and RF; subsequently she was found to be ANCA positive             (b) hemolysis (mild): she has a mildly elevated LDH and a DAT positive for complement, not IgG; this is c/w (a)  above             (c) anemia of renal insufficiency: inadequate EPO resonse to anemia              (d) GIB/ iron deficiency in patient on lifelong anticoagulaton  (1) Feraheme received 03/01/2019, repeated 03/20/2019   (a) reticulocyte 03/07/2019 up from 43.4 a year ago to 191.8 after iron infusion  (b) feraheme repeated on 05/12/2019  (c) subsequent ferritin levels >100  (2) darbepoetin: starting 05/05/2019 at 138mg given every 2 weeks  (a) dose increased to 200 mcg every week starting 07/01/2019   (b) back to every 2 weeks beginning 08/04/2019 still at 200 mcg dose  (3) ANCA positive vasculitis: diagnosed 05/08/2019 (titer 1:640)  (a) prednisone 40 mg daily started 05/31/2019  (b) rituximab weekly started 06/12/2019 (received test dose 06/05/2019)   (i) hepatitis B sAg, core Ab and HIV negative 05/08/2019   (ii) rituximab held after 07/14/2019 dose (received 5 weekly doses) with neutropenia   (iii) rituximab resumed 08/18/2019 but changed to monthly   (iv) rituximab changed to every 2 months beginning with 02/02/2020 dose  (c) prednisone tapered to off as of 09/15/2019  (4) osteopenia with a T score of -1.5 on bone density 02/28/2016 at the [facility]    PLAN: DSilvanais doing well today.  She met with myself and Dr. MJana Hakimtoday to review her overall progress with her treatment.  From a hematologic perspective she is doing quite well.  Her hemoglobin remains higher than 10, and she is tolerating her treatments with Rituximab well.  She will continue with her current plan, receiving Retacrit every 4 weeks, and Rituximab every 8 weeks.    The second issue with DMidatoday is her heart.  She is bradycardic.  Her rhythm is regular, however she is beginning to become symptomatic with dizziness.  I will send her cardiologist, Dr. MAundra Dubina message about this, as he is managing her cardiac medications.    Rabecka saw a new nephrologist and cannot recall their name.  She is going to  call  back and give [place] this information.    She will see Dr. Jana Hakim prior to her Rituximab in 4 months.  She knows to call for any questions that may arise between now and her next appointment.  We are happy to see her sooner if needed.  Total encounter time 30 minutes.Wilber Bihari, NP 04/25/20 4:20 PM Medical Oncology and Hematology [facility] [place], [place] [address] Tel. [phone]    Fax. [phone]   ADDENDUM: Yamilex is doing remarkably well with her current treatment.  She receives Retacrit once a month now for her anemia of renal failure compounded by anemia of chronic illness/chronic inflammation.  The inflammatory portion is being well controlled with rituximab which she now receives every 2 months.  This is keeping her hemoglobin above 10 and optimizing her functional status.  Accordingly we are continuing as before.  She will see me in 4 months but of course I have encouraged her to call us if there is any change in her status before that visit.  I personally saw this patient and performed a substantive portion of this encounter with the listed APP documented above.   Chauncey Cruel, MD Medical Oncology and Hematology [facility] [address] [place], [place] [address] Tel. [phone]    Fax. [phone]    *Total Encounter Time as defined by the Centers for Medicare and Medicaid Services includes, in addition to the face-to-face time of a patient visit (documented in the note above) non-face-to-face time: obtaining and reviewing outside history, ordering and reviewing medications, tests or procedures, care coordination (communications with other health care professionals or caregivers) and documentation in the medical record.

## 2020-04-26 ENCOUNTER — Inpatient Hospital Stay: Payer: Medicare HMO

## 2020-04-26 ENCOUNTER — Other Ambulatory Visit: Payer: Self-pay

## 2020-04-26 ENCOUNTER — Ambulatory Visit: Payer: Medicare HMO

## 2020-04-26 ENCOUNTER — Inpatient Hospital Stay (HOSPITAL_BASED_OUTPATIENT_CLINIC_OR_DEPARTMENT_OTHER): Payer: Medicare HMO | Admitting: Adult Health

## 2020-04-26 ENCOUNTER — Encounter: Payer: Self-pay | Admitting: Adult Health

## 2020-04-26 ENCOUNTER — Inpatient Hospital Stay: Payer: Medicare HMO | Attending: Oncology

## 2020-04-26 VITALS — BP 153/66 | HR 45 | Temp 98.2°F | Resp 18 | Ht 62.0 in | Wt 129.8 lb

## 2020-04-26 DIAGNOSIS — I776 Arteritis, unspecified: Secondary | ICD-10-CM

## 2020-04-26 DIAGNOSIS — D500 Iron deficiency anemia secondary to blood loss (chronic): Secondary | ICD-10-CM

## 2020-04-26 DIAGNOSIS — N189 Chronic kidney disease, unspecified: Secondary | ICD-10-CM | POA: Diagnosis not present

## 2020-04-26 DIAGNOSIS — D594 Other nonautoimmune hemolytic anemias: Secondary | ICD-10-CM

## 2020-04-26 DIAGNOSIS — D696 Thrombocytopenia, unspecified: Secondary | ICD-10-CM

## 2020-04-26 DIAGNOSIS — I7782 Antineutrophilic cytoplasmic antibody (ANCA) vasculitis: Secondary | ICD-10-CM

## 2020-04-26 DIAGNOSIS — I739 Peripheral vascular disease, unspecified: Secondary | ICD-10-CM

## 2020-04-26 DIAGNOSIS — D631 Anemia in chronic kidney disease: Secondary | ICD-10-CM | POA: Diagnosis not present

## 2020-04-26 DIAGNOSIS — D508 Other iron deficiency anemias: Secondary | ICD-10-CM

## 2020-04-26 LAB — CBC WITH DIFFERENTIAL/PLATELET
Abs Immature Granulocytes: 0.04 10*3/uL (ref 0.00–0.07)
Basophils Absolute: 0.1 10*3/uL (ref 0.0–0.1)
Basophils Relative: 1 %
Eosinophils Absolute: 0.2 10*3/uL (ref 0.0–0.5)
Eosinophils Relative: 4 %
HCT: 31.6 % — ABNORMAL LOW (ref 36.0–46.0)
Hemoglobin: 10.5 g/dL — ABNORMAL LOW (ref 12.0–15.0)
Immature Granulocytes: 1 %
Lymphocytes Relative: 17 %
Lymphs Abs: 1 10*3/uL (ref 0.7–4.0)
MCH: 31.1 pg (ref 26.0–34.0)
MCHC: 33.2 g/dL (ref 30.0–36.0)
MCV: 93.5 fL (ref 80.0–100.0)
Monocytes Absolute: 0.5 10*3/uL (ref 0.1–1.0)
Monocytes Relative: 9 %
Neutro Abs: 3.9 10*3/uL (ref 1.7–7.7)
Neutrophils Relative %: 68 %
Platelets: 135 10*3/uL — ABNORMAL LOW (ref 150–400)
RBC: 3.38 MIL/uL — ABNORMAL LOW (ref 3.87–5.11)
RDW: 15.3 % (ref 11.5–15.5)
WBC: 5.7 10*3/uL (ref 4.0–10.5)
nRBC: 0 % (ref 0.0–0.2)

## 2020-04-26 LAB — RETICULOCYTES
Immature Retic Fract: 3.2 % (ref 2.3–15.9)
RBC.: 3.49 MIL/uL — ABNORMAL LOW (ref 3.87–5.11)
Retic Count, Absolute: 51.7 10*3/uL (ref 19.0–186.0)
Retic Ct Pct: 1.5 % (ref 0.4–3.1)

## 2020-04-26 LAB — FERRITIN: Ferritin: 186 ng/mL (ref 11–307)

## 2020-04-26 LAB — SAVE SMEAR(SSMR), FOR PROVIDER SLIDE REVIEW

## 2020-04-26 MED ORDER — EPOETIN ALFA-EPBX 10000 UNIT/ML IJ SOLN
INTRAMUSCULAR | Status: AC
  Filled 2020-04-26: qty 1

## 2020-04-26 MED ORDER — EPOETIN ALFA-EPBX 40000 UNIT/ML IJ SOLN
INTRAMUSCULAR | Status: AC
  Filled 2020-04-26: qty 2

## 2020-04-26 MED ORDER — EPOETIN ALFA-EPBX 20000 UNIT/ML IJ SOLN
50000.0000 [IU] | Freq: Once | INTRAMUSCULAR | Status: AC
  Administered 2020-04-26: 50000 [IU] via SUBCUTANEOUS
  Filled 2020-04-26: qty 2.5

## 2020-04-26 NOTE — Patient Instructions (Signed)

## 2020-04-29 ENCOUNTER — Other Ambulatory Visit (HOSPITAL_COMMUNITY): Payer: Self-pay | Admitting: Cardiology

## 2020-05-04 ENCOUNTER — Telehealth (HOSPITAL_COMMUNITY): Payer: Self-pay | Admitting: *Deleted

## 2020-05-04 NOTE — Telephone Encounter (Signed)
Take Lasix 80 qam/40 qpm x 2 days then back to 80 mg daily.

## 2020-05-04 NOTE — Telephone Encounter (Signed)
Pt aware and agreeable with plan.  

## 2020-05-04 NOTE — Telephone Encounter (Signed)
Pt called stating her weights is up 4lbs in 1 week. Pt is asymptomatic and taking all medications as prescribed.   Routed to [place] for advice

## 2020-05-12 ENCOUNTER — Telehealth: Payer: Self-pay | Admitting: *Deleted

## 2020-05-12 NOTE — Telephone Encounter (Signed)
Pt left VM on this RN's line stating she wanted to let Mendel Ryder know who the kidney doctor is she saw: Dr Rolan Lipa at [facility].  This message will be routed to [facility]/NP.

## 2020-05-12 NOTE — Telephone Encounter (Signed)
Thanks Val.  I added her to the care teams so we can send her our notes as well!

## 2020-05-18 ENCOUNTER — Other Ambulatory Visit: Payer: Self-pay

## 2020-05-18 ENCOUNTER — Ambulatory Visit (INDEPENDENT_AMBULATORY_CARE_PROVIDER_SITE_OTHER): Payer: Medicare HMO | Admitting: Pharmacist Clinician (PhC)/ Clinical Pharmacy Specialist

## 2020-05-18 DIAGNOSIS — Z952 Presence of prosthetic heart valve: Secondary | ICD-10-CM

## 2020-05-18 DIAGNOSIS — Z7901 Long term (current) use of anticoagulants: Secondary | ICD-10-CM

## 2020-05-18 LAB — POCT INR: INR: 1.9 — AB (ref 2.0–3.0)

## 2020-05-19 ENCOUNTER — Ambulatory Visit: Payer: Medicare HMO | Admitting: Podiatry

## 2020-05-19 ENCOUNTER — Encounter: Payer: Self-pay | Admitting: Podiatry

## 2020-05-19 DIAGNOSIS — B351 Tinea unguium: Secondary | ICD-10-CM | POA: Diagnosis not present

## 2020-05-19 DIAGNOSIS — R600 Localized edema: Secondary | ICD-10-CM

## 2020-05-19 DIAGNOSIS — M79675 Pain in left toe(s): Secondary | ICD-10-CM | POA: Diagnosis not present

## 2020-05-19 DIAGNOSIS — E1151 Type 2 diabetes mellitus with diabetic peripheral angiopathy without gangrene: Secondary | ICD-10-CM

## 2020-05-19 DIAGNOSIS — L600 Ingrowing nail: Secondary | ICD-10-CM

## 2020-05-19 DIAGNOSIS — M79674 Pain in right toe(s): Secondary | ICD-10-CM | POA: Diagnosis not present

## 2020-05-20 NOTE — Progress Notes (Signed)
Subjective: [name] presents today painful mycotic nails b/l that are difficult to trim. Pain interferes with ambulation. Aggravating factors include wearing enclosed shoe gear. Pain is relieved with periodic professional debridement.   She states her right great toe is bothering her today. It started a couple of days ago. She denies any redness, drainage or swelling.  She states she did purchase open toed compression hose which do make her ingrown toenails feel better, but she does have problems taking them off due to hand weakness.  [name], [name], MD is patient's PCP. Last visit was: 02/20/2020.  Past Medical History:  Diagnosis Date  . Aortic atherosclerosis ([place]) 01/23/2018  . Atrial fibrillation ([place])   . Carotid artery disease ([place])   . Cholelithiasis 01/23/2018  . Chronic anticoagulation   . Coronary artery disease    status post coronary artery bypass grafting times 07/10/2004  . Diabetes mellitus   . Hypercholesteremia   . Hypertension   . Mechanical heart valve present    H. aortic valve replacement at the time of bypass surgery October 2005  . Moderate to severe pulmonary hypertension ([place])   . Peripheral arterial disease (H[facility])    history of left common iliac artery PTA and stenting for a chronic total occlusion 08/26/01     Current Outpatient Medications on File Prior to Visit  Medication Sig Dispense Refill  . acetaminophen (TYLENOL) 325 MG tablet Take 650 mg by mouth every 6 (six) hours as needed for mild pain.     [name] allopurinol (ZYLOPRIM) 100 MG tablet Take 100 mg by mouth daily.    [name] ALPRAZolam (XANAX) 0.5 MG tablet Take 0.25 mg by mouth at bedtime.     [name] amLODipine (NORVASC) 10 MG tablet Take 1 tablet (10 mg total) by mouth daily. 90 tablet 3  . atorvastatin (LIPITOR) 80 MG tablet Take 1 tablet (80 mg total) by mouth daily at 6 PM.    . BESIVANCE 0.6 % SUSP Place 1 drop into the right eye 3 (three) times daily.    . Cholecalciferol (VITAMIN D3) 1000  UNITS CAPS Take 2,000 Units by mouth daily with lunch.     . cloNIDine (CATAPRES) 0.2 MG tablet Take 1 tablet (0.2 mg total) by mouth 2 (two) times daily. 60 tablet 3  . colchicine 0.6 MG tablet     . Cyanocobalamin (VITAMIN B12) 1000 MCG TBCR Take 1,000 mcg by mouth daily with lunch.     . DUREZOL 0.05 % EMUL Place 1 drop into the right eye 3 (three) times daily.    . furosemide (LASIX) 80 MG tablet Take 1 tablet (80 mg total) by mouth daily. 90 tablet 3  . gatifloxacin (ZYMAXID) 0.5 % SOLN Place 1 drop into the right eye 4 (four) times daily.    . hydrALAZINE (APRESOLINE) 100 MG tablet TAKE 1 TABLET THREE TIMES DAILY 270 tablet 3  . ketorolac (ACULAR) 0.5 % ophthalmic solution Place 1 drop into the right eye 4 (four) times daily.    [name] lactose free nutrition (BOOST) LIQD Take 237 mLs by mouth daily. (To supplement meals)     . magnesium oxide (MAG-OX) 400 (241.3 Mg) MG tablet Take 1 tablet (400 mg total) by mouth daily.    . metFORMIN (GLUCOPHAGE) 500 MG tablet Take 500 mg by mouth 2 (two) times daily with a meal.     . metoprolol succinate (TOPROL XL) 25 MG 24 hr tablet Take 0.5 tablets (12.5 mg total) by mouth daily. 45 tablet  3  . pantoprazole (PROTONIX) 40 MG tablet Take 40 mg by mouth 2 (two) times daily.     [name] PROLENSA 0.07 % SOLN SMARTSIG:1 Drop(s) Right Eye Every Evening    . spironolactone (ALDACTONE) 25 MG tablet Take 1 tablet (25 mg total) by mouth daily. 90 tablet 3  . tadalafil, PAH, (ADCIRCA) 20 MG tablet TAKE 2 TABLETS DAILY 60 tablet 11  . tobramycin (TOBREX) 0.3 % ophthalmic solution     . vitamin C (ASCORBIC ACID) 500 MG tablet Take 500 mg by mouth daily with lunch.    . warfarin (COUMADIN) 5 MG tablet TAKE 1.5 TO 2 TABLETS BY MOUTH DAILY AS DIRECTED BY COUMADIN CLINIC 180 tablet 0   No current facility-administered medications on file prior to visit.     Allergies  Allergen Reactions  . Flagyl [Metronidazole] Rash    ALL-OVER BODY RASH  . Coreg [Carvedilol] Other (See  Comments)    Terrible cramping in the feet and had a lot of bowel movements, but not diarrhea  . Losartan Swelling    Patient doesn't recall site of swelling  . Verapamil Hives  . Zetia [Ezetimibe] Other (See Comments)    Reaction not recalled  . Zocor [Simvastatin - High Dose] Other (See Comments)    Reaction not recalled  . Gatifloxacin Rash    Redness to skin around eye    Objective: [name] is a pleasant 79 y.o. Caucasian female, WD, WN in NAD. AAO x 3.  There were no vitals filed for this visit.  Vascular Examination: Neurovascular status unchanged b/l lower extremities. Capillary fill time to digits <3 seconds b/l lower extremities. Palpable DP pulses b/l. Palpable PT pulses b/l. Pedal hair absent b/l. Skin temperature gradient within normal limits b/l. +1 pitting edema b/l lower extremities.  Dermatological Examination: Pedal skin is thin shiny, atrophic bilaterally. No open wounds bilaterally. No interdigital macerations bilaterally. Toenails 1-5 b/l elongated, discolored, dystrophic, thickened, crumbly with subungual debris and tenderness to dorsal palpation.   Incurvated nailplate right great toe lateral border and left great toe medial border with tenderness to palpation. No erythema, no edema, no drainage noted.  Musculoskeletal: Normal muscle strength 5/5 to all lower extremity muscle groups bilaterally. No pain crepitus or joint limitation noted with ROM b/l. No gross bony deformities bilaterally.   Neurological Examination: Protective sensation intact 5/5 intact bilaterally with 10g monofilament b/l. Vibratory sensation intact b/l. Proprioception intact bilaterally.   Ankle Brachial Indices 04/01/2020 [facility] [address] Results  Procedure Component Value Ref Range Date/Time  VAS [place] ABI WITH/WO TBI [[id number]] Collected: 04/01/20 0947  Order Status: Completed Updated: 04/03/20 0804  Narrative:    LOWER EXTREMITY DOPPLER STUDY    Indications: History of PAD with left common iliac artery intervention. Patient        denies any abdominal pain or claudication symptoms. She is        concerned with increase swelling to the lower left leg, with        reddish discoloration. She also would like to have her left great        toenail removed due to ingrown nail, but is concerned with healing.   High Risk Factors: Hypertension, hyperlipidemia, Diabetes, past history of           smoking, coronary artery disease.    Vascular Interventions: Left common iliac artery stenting in 08/2001.   Comparison Study: In 04/2018, an arterial Doppler showed the ABIs to be  non-compressible, bilaterally.   Performing Technologist: Sharlett Iles RVT     Examination Guidelines: A complete evaluation includes at minimum, Doppler  waveform signals and systolic blood pressure reading at the level of bilateral  brachial, anterior tibial, and posterior tibial arteries, when vessel segments  are accessible. Bilateral testing is considered an integral part of a complete  examination. Photoelectric Plethysmograph (PPG) waveforms and toe systolic  pressure readings are included as required and additional duplex testing as  needed. Limited examinations for reoccurring indications may be performed as  noted.     ABI Findings:  +---------+------------------+-----+--------+--------+  Right  Rt Pressure (mmHg)IndexWaveformComment   +---------+------------------+-----+--------+--------+  Brachial 168                     +---------+------------------+-----+--------+--------+  ATA   254        1.50 biphasic      +---------+------------------+-----+--------+--------+  PTA   254        1.50 biphasic      +---------+------------------+-----+--------+--------+  PERO   254        1.50 biphasic       +---------+------------------+-----+--------+--------+  Georgeanna Harrison        0.82 Abnormal      +---------+------------------+-----+--------+--------+   +---------+------------------+-----+--------+-------+  Left   Lt Pressure (mmHg)IndexWaveformComment  +---------+------------------+-----+--------+-------+  Brachial 169                     +---------+------------------+-----+--------+-------+  ATA   254        1.50 biphasic      +---------+------------------+-----+--------+-------+  PTA   254        1.50 biphasic      +---------+------------------+-----+--------+-------+  PERO   254        1.50 biphasic      +---------+------------------+-----+--------+-------+  Doristine Devoid Toe121        0.72 Abnormal      +---------+------------------+-----+--------+-------+   +-------+-----------+-----------+------------+------------+  ABI/TBIToday's ABIToday's TBIPrevious ABIPrevious TBI  +-------+-----------+-----------+------------+------------+  Right [place]     .82    [place]     .91       +-------+-----------+-----------+------------+------------+  Left  [place]     .72    [place]     .78       +-------+-----------+-----------+------------+------------+   Bilateral ABIs and TBIs appear essentially unchanged compared to prior study on 04/29/2018.    Summary:  Right: Resting right ankle-brachial index indicates noncompressible right lower extremity arteries. The right toe-brachial index is normal.   Left: Resting left ankle-brachial index indicates noncompressible left lower extremity arteries. The left toe-brachial index is normal.      *See table(s) above for measurements and observations.  See Aortoiliac duplex report.   Vascular consult recommended.  Electronically signed by Larae Grooms MD on 04/03/2020 at 8:04:34 AM.        Final     Assessment: 1. Pain due to onychomycosis of toenails of both feet   2. Ingrown toenail without infection   3. Edema, lower extremity   4. Type II diabetes mellitus with peripheral circulatory disorder (HCC)     Plan: -Examined patient. -Continue open toed compression hose daily.  -Toenails 1-5 b/l were debrided in length and girth with sterile nail nippers and dremel without iatrogenic bleeding. Offending nail borders debrided and curretaged b/l great toes. Borders cleansed with alcohol. Triple antibiotic ointment applied. She may apply antibiotic ointment to digits once daily for one week. Call office if she has any problems. -Patient to continue soft, supportive shoe gear  daily. -Patient to report any pedal injuries to medical professional immediately. -Patient/POA to call should there be question/concern in the interim.  Return in about 9 weeks (around 07/21/2020) for diabetic nail trim.  Marzetta Board, DPM

## 2020-05-23 NOTE — Progress Notes (Signed)
[facility]  [phone] [phone]    ID: [name] DOB: January 06, 1941  MR#: [id number]  ZHY#:[id number]  Patient Care Team: [name], MD as PCP - General (Internal Medicine) [name], MD as PCP - Advanced Heart Failure (Cardiology) Lorretta Harp, MD as PCP - Cardiology (Cardiology) Magrinat, [name], MD as Consulting Physician (Hematology and Oncology) Arta Silence, MD as Consulting Physician (Gastroenterology) [name], MD as Consulting Physician (Cardiology) Marzetta Board, DPM as Consulting Physician (Podiatry) Gavin Pound, MD as Consulting Physician (Rheumatology) Jon Billings, RN as [facility] Corliss Parish, MD as Consulting Physician (Nephrology) OTHER MD:   CHIEF COMPLAINT: anemia of renal failure; vasculitis  CURRENT TREATMENT: [Feraheme], Retacrit, rituximab   INTERVAL HISTORY: [name] returns today for follow-up and treatment of her multifactorial anemia and vasculitis.  She receives Retacrit every 4 weeks, and rituximab every 8 weeks.  She is tolerating these well and says these do help her energy.  She is due for retacrit today.  Her hemoglobin remains above 10 and her ferritin level has remained above 100.     REVIEW OF SYSTEMS: [name] continues to have some mild dizziness, that she noted at her last appointment.  She is unsure if this Is improved, and is going to see her cardiologist on Friday, Dr. [name].  She does have some bowel changes, and has diarrhea, associated with certain acidic foods such as cole slaw and tomatoes.  She notes that she is mildly fatigued and that is related to sometimes having difficulty sleeping.  A detailed ROS was otherwise non contributory today.      HISTORY OF CURRENT ILLNESS: From the original consult note:  [name] is an 79 y.o. female from [place], [place].  She has a past medical history  significant forPAD s/p stenting; moderate to severe pulmonary HTN; afib and AVR on Coumadin; HTN; HLD; DM; and CAD s/p CABG. the patient was admitted to the hospital for anemia.  She was having increasing fatigue and shortness of breath and had routine lab work drawn which showed a hemoglobin of 6.6.  Due to her abnormal lab work, she was advised to come to the emergency room for admission.  She was also admitted in February 2020 for symptomatic anemia.  She reports she has been anemic for at least a year and has been taking oral iron.  She underwent a colonoscopy and EGD on 01/22/2018.  The colonoscopy showed diverticulosis in the sigmoid colon and descending colon, internal hemorrhoids.  The upper endoscopy was normal except for erythematous mucosa in the antrum.  She had a repeat EGD on 11/21/2018 that showed a few minimally oozing( with scope trauma) superficial gastric ulcers with pigmented material were found in the gastric body. The largest lesion was 4 mm in largest dimension.  She underwent another upper endoscopy on 02/27/2019 which showed patchy mildly friable mucosa with contact bleeding was found in the gastric fundus, in the gastric body and in the gastric antrum.  The patient is maintained on warfarin due to A. fib and a mechanical aortic valve.  Review of the patient's chart shows that she has been anemic dating back to at least December thousand 12 (these are the earliest labs available to me).  Her highest hemoglobin was 11.6 in December 2012.  I also note that the patient has had intermittent mild leukopenia adding back to at least April 2019.  Her lowest white blood cell count was  2.6 in April 2019.  Since admission, the patient has received 1 unit of packed red blood cells.  She is currently on heparin for her procedure with plan to bridge back to Coumadin.  Stool for occult blood was negative x1 on 02/24/2019.  Hematology was asked see the patient to make recommendations regarding her  anemia.  [name] subsequent history is as detailed below.   PAST MEDICAL HISTORY: Past Medical History:  Diagnosis Date  . Aortic atherosclerosis ([facility]) 01/23/2018  . Atrial fibrillation ([facility])   . Carotid artery disease ([facility])   . Cholelithiasis 01/23/2018  . Chronic anticoagulation   . Coronary artery disease    status post coronary artery bypass grafting times 07/10/2004  . Diabetes mellitus   . Hypercholesteremia   . Hypertension   . Mechanical heart valve present    H. aortic valve replacement at the time of bypass surgery October 2005  . Moderate to severe pulmonary hypertension ([facility])   . Peripheral arterial disease ([facility])    history of left common iliac artery PTA and stenting for a chronic total occlusion 08/26/01    PAST SURGICAL HISTORY: Past Surgical History:  Procedure Laterality Date  . AORTIC VALVE REPLACEMENT    . CARDIAC CATHETERIZATION  11/10/2004   40% right common illiac, 70% in stent restenosis of distal left common illiac,   . CARDIAC CATHETERIZATION  05/18/2004   LAD 50-70% midstenosis, RCA dominant w/50% stenosis, 50% Right common Illiac artery ostial stenosis, 90% in stent restenosis within midportion of left common illiac stent  . Carotid Duplex  03/12/2012   RSA-elev. velocities suggestive of a 50-69% diameter reduction, Right&Left Bulb/Prox ICA-mild-mod.fibrous plaqueelevating Velocities abnormal study.  . CHOLECYSTECTOMY N/A 03/01/2018   Procedure: LAPAROSCOPIC CHOLECYSTECTOMY;  Surgeon: Judeth Horn, MD;  Location: [facility];  Service: General;  Laterality: N/A;  . COLONOSCOPY WITH PROPOFOL N/A 01/22/2018   Procedure: COLONOSCOPY WITH PROPOFOL;  Surgeon: Wilford Corner, MD;  Location: [facility];  Service: Endoscopy;  Laterality: N/A;  . ESOPHAGOGASTRODUODENOSCOPY (EGD) WITH PROPOFOL N/A 01/22/2018   Procedure: ESOPHAGOGASTRODUODENOSCOPY (EGD) WITH PROPOFOL;  Surgeon: Wilford Corner, MD;  Location: [facility];  Service: Endoscopy;  Laterality:  N/A;  . ESOPHAGOGASTRODUODENOSCOPY (EGD) WITH PROPOFOL N/A 11/21/2018   Procedure: ESOPHAGOGASTRODUODENOSCOPY (EGD) WITH PROPOFOL;  Surgeon: [name], MD;  Location: [facility];  Service: Gastroenterology;  Laterality: N/A;  . ESOPHAGOGASTRODUODENOSCOPY (EGD) WITH PROPOFOL Left 02/27/2019   Procedure: ESOPHAGOGASTRODUODENOSCOPY (EGD) WITH PROPOFOL;  Surgeon: Arta Silence, MD;  Location: [facility];  Service: Endoscopy;  Laterality: Left;  . GIVENS CAPSULE STUDY N/A 11/21/2018   Procedure: GIVENS CAPSULE STUDY;  Surgeon: [name], MD;  Location: [facility];  Service: Gastroenterology;  Laterality: N/A;  To be deployed during EGD  . Lower Ext. Duplex  03/12/2012   Right Proximal CIA- vessel narrowing w/elevated velocities 0-49% diameter reduction. Right SFA-mild mixed density plaque throughout vessel.  [name] NM MYOCAR PERF WALL MOTION  05/19/2010   protocol: Persantine, post stress EF 65%, negative for ischemia, low risk scan  . RIGHT HEART CATH N/A 06/27/2018   Procedure: RIGHT HEART CATH;  Surgeon: [name], MD;  Location: [facility];  Service: Cardiovascular;  Laterality: N/A;  . TRANSTHORACIC ECHOCARDIOGRAM  08/29/2012   Moderately calcified annulus of mitral valve, moderate regurg. of both mitral valve and tricuspid valve.     FAMILY HISTORY: Family History  Problem Relation Age of Onset  . Breast cancer Neg Hx     GYNECOLOGIC HISTORY:  No LMP recorded. Patient has  had a hysterectomy. Menarche:  years old Age at first live birth:  Arma P: 1 LMP:  Contraceptive:  HRT:   Hysterectomy?: yes BSO?:    SOCIAL HISTORY: (Current as of 03/07/2019) [name] is widowed.  She lives by herself.  She has 3 children that live locally.  She smoked three quarters of a pack of cigarettes daily for 50 years.  She quit in 2005.  Denies alcohol use.   ADVANCED DIRECTIVES: Not in place    HEALTH MAINTENANCE: Social History   Tobacco Use  . Smoking status: Former Smoker     Packs/day: 1.00    Years: 30.00    Pack years: 30.00    Types: Cigarettes  . Smokeless tobacco: Never Used  . Tobacco comment: quit smoking 2005  Vaping Use  . Vaping Use: Never used  Substance Use Topics  . Alcohol use: No    Alcohol/week: 0.0 standard drinks  . Drug use: No    Colonoscopy: EGD on 02/27/2019 under Dr. Paulita Fujita found some friable gastric mucosa.  Colonoscopy under Dr. Michail Sermon 01/22/2018 showed significant diverticular disease  PAP:   Bone density: May 2017 showed osteopenia Mammography: 02/25/2017, breast density category B.  Allergies  Allergen Reactions  . Flagyl [Metronidazole] Rash    ALL-OVER BODY RASH  . Coreg [Carvedilol] Other (See Comments)    Terrible cramping in the feet and had a lot of bowel movements, but not diarrhea  . Losartan Swelling    Patient doesn't recall site of swelling  . Verapamil Hives  . Zetia [Ezetimibe] Other (See Comments)    Reaction not recalled  . Zocor [Simvastatin - High Dose] Other (See Comments)    Reaction not recalled  . Gatifloxacin Rash    Redness to skin around eye    Current Outpatient Medications  Medication Sig Dispense Refill  . acetaminophen (TYLENOL) 325 MG tablet Take 650 mg by mouth every 6 (six) hours as needed for mild pain.     [name] allopurinol (ZYLOPRIM) 100 MG tablet Take 100 mg by mouth daily.    [name] ALPRAZolam (XANAX) 0.5 MG tablet Take 0.25 mg by mouth at bedtime.     [name] amLODipine (NORVASC) 10 MG tablet Take 1 tablet (10 mg total) by mouth daily. 90 tablet 3  . atorvastatin (LIPITOR) 80 MG tablet Take 1 tablet (80 mg total) by mouth daily at 6 PM.    . BESIVANCE 0.6 % SUSP Place 1 drop into the right eye 3 (three) times daily.    . Cholecalciferol (VITAMIN D3) 1000 UNITS CAPS Take 2,000 Units by mouth daily with lunch.     . cloNIDine (CATAPRES) 0.2 MG tablet Take 1 tablet (0.2 mg total) by mouth 2 (two) times daily. 60 tablet 3  . colchicine 0.6 MG tablet     . Cyanocobalamin (VITAMIN B12) 1000 MCG  TBCR Take 1,000 mcg by mouth daily with lunch.     . DUREZOL 0.05 % EMUL Place 1 drop into the right eye 3 (three) times daily.    . furosemide (LASIX) 80 MG tablet Take 1 tablet (80 mg total) by mouth daily. 90 tablet 3  . hydrALAZINE (APRESOLINE) 100 MG tablet TAKE 1 TABLET THREE TIMES DAILY 270 tablet 3  . ketorolac (ACULAR) 0.5 % ophthalmic solution Place 1 drop into the right eye 4 (four) times daily.    [name] lactose free nutrition (BOOST) LIQD Take 237 mLs by mouth daily. (To supplement meals)     . magnesium oxide (MAG-OX) 400 (241.3  Mg) MG tablet Take 1 tablet (400 mg total) by mouth daily.    . metFORMIN (GLUCOPHAGE) 500 MG tablet Take 500 mg by mouth 2 (two) times daily with a meal.     . metoprolol succinate (TOPROL XL) 25 MG 24 hr tablet Take 0.5 tablets (12.5 mg total) by mouth daily. 45 tablet 3  . pantoprazole (PROTONIX) 40 MG tablet Take 40 mg by mouth 2 (two) times daily.     [name] PROLENSA 0.07 % SOLN SMARTSIG:1 Drop(s) Right Eye Every Evening    . spironolactone (ALDACTONE) 25 MG tablet Take 1 tablet (25 mg total) by mouth daily. 90 tablet 3  . tadalafil, PAH, (ADCIRCA) 20 MG tablet TAKE 2 TABLETS DAILY 60 tablet 11  . tobramycin (TOBREX) 0.3 % ophthalmic solution     . vitamin C (ASCORBIC ACID) 500 MG tablet Take 500 mg by mouth daily with lunch.    . warfarin (COUMADIN) 5 MG tablet TAKE 1.5 TO 2 TABLETS BY MOUTH DAILY AS DIRECTED BY COUMADIN CLINIC 180 tablet 0  . gatifloxacin (ZYMAXID) 0.5 % SOLN Place 1 drop into the right eye 4 (four) times daily. (Patient not taking: Reported on 05/24/2020)     No current facility-administered medications for this visit.    OBJECTIVE:  white woman using a cane Vitals:   05/24/20 1126  BP: (!) 140/51  Pulse: (!) 56  Resp: 20  Temp: 97.7 F (36.5 C)  SpO2: 98%   Wt Readings from Last 3 Encounters:  05/24/20 130 lb 1.6 oz (59 kg)  04/26/20 129 lb 12.8 oz (58.9 kg)  04/06/20 127 lb 8 oz (57.8 kg)   Body mass index is 23.8 kg/m.     ECOG FS:2 - Symptomatic, <50% confined to bed GENERAL: Patient is a well appearing female in no acute distress HEENT:  Sclerae anicteric.  Mask in place. Neck is supple.  NODES:  No cervical, supraclavicular, or axillary lymphadenopathy palpated.  BREAST EXAM:  Deferred. LUNGS:  Clear to auscultation bilaterally.  No wheezes or rhonchi. HEART:  Regular rhythm, , +systolic murmur and mechanical S2.  ABDOMEN:  Soft, nontender.  Positive, normoactive bowel sounds. No organomegaly palpated. MSK:  No focal spinal tenderness to palpation.  EXTREMITIES:  Bilateral lower extremity edema noted   SKIN:  Clear with no obvious rashes or skin changes. No nail dyscrasia. NEURO:  Nonfocal. Well oriented.  Appropriate affect.   LAB RESULTS:  CMP     Component Value Date/Time   NA 133 (L) 03/30/2020 0932   NA 137 12/06/2017 1436   K 4.4 03/30/2020 0932   CL 103 03/30/2020 0932   CO2 20 (L) 03/30/2020 0932   GLUCOSE 152 (H) 03/30/2020 0932   BUN 57 (H) 03/30/2020 0932   BUN 20 12/06/2017 1436   CREATININE 1.73 (H) 03/30/2020 0932   CALCIUM 9.3 03/30/2020 0932   PROT 6.3 (L) 03/30/2020 0932   ALBUMIN 3.6 03/30/2020 0932   AST 21 03/30/2020 0932   ALT 16 03/30/2020 0932   ALKPHOS 83 03/30/2020 0932   BILITOT 0.4 03/30/2020 0932   GFRNONAA 27 (L) 03/30/2020 0932   GFRAA 32 (L) 03/30/2020 0932    Lab Results  Component Value Date   TOTALPROTELP 6.9 05/07/2019    No results found for: KPAFRELGTCHN, LAMBDASER, KAPLAMBRATIO  Lab Results  Component Value Date   WBC 6.1 05/24/2020   NEUTROABS 4.1 05/24/2020   HGB 10.5 (L) 05/24/2020   HCT 31.9 (L) 05/24/2020   MCV 94.1 05/24/2020  PLT 148 (L) 05/24/2020    No results found for: LABCA2  No components found for: [id number]  Recent Labs  Lab 05/18/20 1324  INR 1.9*    No results found for: LABCA2  No results found for: JKK938  No results found for: HWE993  No results found for: ZJI967  No results found for:  CA2729  No components found for: HGQUANT  No results found for: CEA1 / No results found for: CEA1   No results found for: AFPTUMOR  No results found for: CHROMOGRNA  No results found for: HGBA, HGBA2QUANT, HGBFQUANT, HGBSQUAN (Hemoglobinopathy evaluation)   Lab Results  Component Value Date   LDH 212 (H) 08/04/2019    Lab Results  Component Value Date   IRON 123 03/30/2020   TIBC 268 03/30/2020   IRONPCTSAT 46 03/30/2020   (Iron and TIBC)  Lab Results  Component Value Date   FERRITIN 186 04/26/2020    Urinalysis    Component Value Date/Time   COLORURINE YELLOW 07/07/2019 0209   APPEARANCEUR CLOUDY (A) 07/07/2019 0209   LABSPEC 1.011 07/07/2019 0209   PHURINE 6.0 07/07/2019 0209   GLUCOSEU NEGATIVE 07/07/2019 0209   HGBUR NEGATIVE 07/07/2019 0209   BILIRUBINUR NEGATIVE 07/07/2019 0209   KETONESUR NEGATIVE 07/07/2019 0209   PROTEINUR >=300 (A) 07/07/2019 0209   UROBILINOGEN 1.0 10/06/2011 1709   NITRITE NEGATIVE 07/07/2019 0209   LEUKOCYTESUR LARGE (A) 07/07/2019 0209    STUDIES:  No results found.   ELIGIBLE FOR AVAILABLE RESEARCH PROTOCOL: no   ASSESSMENT: 79 y.o. [place] woman with multifactorial anemia, as follows:             (a) anemia of chronic inflammation: As of May 2020 she has a SED rate of 95, CRP 1.1, positive ANA and RF; subsequently she was found to be ANCA positive             (b) hemolysis (mild): she has a mildly elevated LDH and a DAT positive for complement, not IgG; this is c/w (a) above             (c) anemia of renal insufficiency: inadequate EPO resonse to anemia              (d) GIB/ iron deficiency in patient on lifelong anticoagulaton  (1) Feraheme received 03/01/2019, repeated 03/20/2019   (a) reticulocyte 03/07/2019 up from 43.4 a year ago to 191.8 after iron infusion  (b) feraheme repeated on 05/12/2019  (c) subsequent ferritin levels >100  (2) darbepoetin: starting 05/05/2019 at 175mcg given every 2 weeks  (a) dose  increased to 200 mcg every week starting 07/01/2019   (b) back to every 2 weeks beginning 08/04/2019 still at 200 mcg dose  (3) ANCA positive vasculitis: diagnosed 05/08/2019 (titer 1:640)  (a) prednisone 40 mg daily started 05/31/2019  (b) rituximab weekly started 06/12/2019 (received test dose 06/05/2019)   (i) hepatitis B sAg, core Ab and HIV negative 05/08/2019   (ii) rituximab held after 07/14/2019 dose (received 5 weekly doses) with neutropenia   (iii) rituximab resumed 08/18/2019 but changed to monthly   (iv) rituximab changed to every 2 months beginning with 02/02/2020 dose  (c) prednisone tapered to off as of 09/15/2019  (4) osteopenia with a T score of -1.5 on bone density 02/28/2016 at the breast center    PLAN: Symphany continues on therapy with Rituximab and Retacrit.  She is tolerating these well.  Her hemoglobin today is 10.5.  She will receive both of these treatments today.  She will conitnue to f/u with her cardiology team, along with her nephrologist.    In regards to her sleep, she would like to take melatonin, which I let her know was reasonable to try.    She was also recommended to avoid acidic foods, and take gasx if needed for gas.    She will see Dr. [name] on Friday to discuss her dizziness, and HTN further.  \  We will see [name] back in 4 weeks for labs, f/u and her next injection.  She knows to call for any questions that may arise between now and her next appointment.  We are happy to see her sooner if needed.   Total encounter time 20 minutes.Wilber Bihari, NP 05/24/20 12:25 PM Medical Oncology and Hematology [facility] [place], [place] [address] Tel. [phone]    Fax. [phone]  *Total Encounter Time as defined by the Centers for Medicare and Medicaid Services includes, in addition to the face-to-face time of a patient visit (documented in the note above) non-face-to-face time: obtaining and reviewing outside  history, ordering and reviewing medications, tests or procedures, care coordination (communications with other health care professionals or caregivers) and documentation in the medical record.

## 2020-05-24 ENCOUNTER — Encounter: Payer: Self-pay | Admitting: Oncology

## 2020-05-24 ENCOUNTER — Other Ambulatory Visit: Payer: Medicare HMO

## 2020-05-24 ENCOUNTER — Other Ambulatory Visit: Payer: Self-pay

## 2020-05-24 ENCOUNTER — Ambulatory Visit: Payer: Medicare HMO

## 2020-05-24 ENCOUNTER — Inpatient Hospital Stay: Payer: Medicare HMO | Attending: Oncology

## 2020-05-24 ENCOUNTER — Encounter: Payer: Self-pay | Admitting: Adult Health

## 2020-05-24 ENCOUNTER — Inpatient Hospital Stay (HOSPITAL_BASED_OUTPATIENT_CLINIC_OR_DEPARTMENT_OTHER): Payer: Medicare HMO | Admitting: Adult Health

## 2020-05-24 ENCOUNTER — Inpatient Hospital Stay: Payer: Medicare HMO

## 2020-05-24 VITALS — BP 140/51 | HR 56 | Temp 97.7°F | Resp 20 | Ht 62.0 in | Wt 130.1 lb

## 2020-05-24 VITALS — BP 151/42 | HR 44 | Temp 97.5°F | Resp 16

## 2020-05-24 DIAGNOSIS — I776 Arteritis, unspecified: Secondary | ICD-10-CM

## 2020-05-24 DIAGNOSIS — D696 Thrombocytopenia, unspecified: Secondary | ICD-10-CM

## 2020-05-24 DIAGNOSIS — Z5112 Encounter for antineoplastic immunotherapy: Secondary | ICD-10-CM | POA: Insufficient documentation

## 2020-05-24 DIAGNOSIS — N189 Chronic kidney disease, unspecified: Secondary | ICD-10-CM | POA: Diagnosis not present

## 2020-05-24 DIAGNOSIS — I7782 Antineutrophilic cytoplasmic antibody (ANCA) vasculitis: Secondary | ICD-10-CM

## 2020-05-24 DIAGNOSIS — I129 Hypertensive chronic kidney disease with stage 1 through stage 4 chronic kidney disease, or unspecified chronic kidney disease: Secondary | ICD-10-CM | POA: Diagnosis not present

## 2020-05-24 DIAGNOSIS — M8580 Other specified disorders of bone density and structure, unspecified site: Secondary | ICD-10-CM | POA: Insufficient documentation

## 2020-05-24 DIAGNOSIS — D631 Anemia in chronic kidney disease: Secondary | ICD-10-CM | POA: Insufficient documentation

## 2020-05-24 DIAGNOSIS — D61818 Other pancytopenia: Secondary | ICD-10-CM

## 2020-05-24 DIAGNOSIS — D594 Other nonautoimmune hemolytic anemias: Secondary | ICD-10-CM

## 2020-05-24 DIAGNOSIS — I739 Peripheral vascular disease, unspecified: Secondary | ICD-10-CM

## 2020-05-24 DIAGNOSIS — D500 Iron deficiency anemia secondary to blood loss (chronic): Secondary | ICD-10-CM

## 2020-05-24 DIAGNOSIS — D508 Other iron deficiency anemias: Secondary | ICD-10-CM

## 2020-05-24 DIAGNOSIS — N184 Chronic kidney disease, stage 4 (severe): Secondary | ICD-10-CM

## 2020-05-24 LAB — CBC WITH DIFFERENTIAL/PLATELET
Abs Immature Granulocytes: 0.04 10*3/uL (ref 0.00–0.07)
Basophils Absolute: 0.1 10*3/uL (ref 0.0–0.1)
Basophils Relative: 1 %
Eosinophils Absolute: 0.2 10*3/uL (ref 0.0–0.5)
Eosinophils Relative: 4 %
HCT: 31.9 % — ABNORMAL LOW (ref 36.0–46.0)
Hemoglobin: 10.5 g/dL — ABNORMAL LOW (ref 12.0–15.0)
Immature Granulocytes: 1 %
Lymphocytes Relative: 18 %
Lymphs Abs: 1.1 10*3/uL (ref 0.7–4.0)
MCH: 31 pg (ref 26.0–34.0)
MCHC: 32.9 g/dL (ref 30.0–36.0)
MCV: 94.1 fL (ref 80.0–100.0)
Monocytes Absolute: 0.5 10*3/uL (ref 0.1–1.0)
Monocytes Relative: 9 %
Neutro Abs: 4.1 10*3/uL (ref 1.7–7.7)
Neutrophils Relative %: 67 %
Platelets: 148 10*3/uL — ABNORMAL LOW (ref 150–400)
RBC: 3.39 MIL/uL — ABNORMAL LOW (ref 3.87–5.11)
RDW: 15.1 % (ref 11.5–15.5)
WBC: 6.1 10*3/uL (ref 4.0–10.5)
nRBC: 0 % (ref 0.0–0.2)

## 2020-05-24 LAB — RETICULOCYTES
Immature Retic Fract: 5.2 % (ref 2.3–15.9)
RBC.: 3.45 MIL/uL — ABNORMAL LOW (ref 3.87–5.11)
Retic Count, Absolute: 37.3 10*3/uL (ref 19.0–186.0)
Retic Ct Pct: 1.1 % (ref 0.4–3.1)

## 2020-05-24 LAB — FERRITIN: Ferritin: 217 ng/mL (ref 11–307)

## 2020-05-24 LAB — SAVE SMEAR(SSMR), FOR PROVIDER SLIDE REVIEW

## 2020-05-24 MED ORDER — SODIUM CHLORIDE 0.9 % IV SOLN
375.0000 mg/m2 | Freq: Once | INTRAVENOUS | Status: AC
  Administered 2020-05-24: 600 mg via INTRAVENOUS
  Filled 2020-05-24: qty 50

## 2020-05-24 MED ORDER — EPOETIN ALFA-EPBX 10000 UNIT/ML IJ SOLN
INTRAMUSCULAR | Status: AC
  Filled 2020-05-24: qty 1

## 2020-05-24 MED ORDER — DIPHENHYDRAMINE HCL 25 MG PO CAPS
25.0000 mg | ORAL_CAPSULE | Freq: Once | ORAL | Status: AC
  Administered 2020-05-24: 25 mg via ORAL

## 2020-05-24 MED ORDER — EPOETIN ALFA-EPBX 40000 UNIT/ML IJ SOLN
40000.0000 [IU] | Freq: Once | INTRAMUSCULAR | Status: AC
  Administered 2020-05-24: 40000 [IU] via SUBCUTANEOUS

## 2020-05-24 MED ORDER — EPOETIN ALFA-EPBX 10000 UNIT/ML IJ SOLN
10000.0000 [IU] | Freq: Once | INTRAMUSCULAR | Status: AC
  Administered 2020-05-24: 10000 [IU] via SUBCUTANEOUS

## 2020-05-24 MED ORDER — SODIUM CHLORIDE 0.9 % IV SOLN
Freq: Once | INTRAVENOUS | Status: AC
  Filled 2020-05-24: qty 250

## 2020-05-24 MED ORDER — ACETAMINOPHEN 325 MG PO TABS
650.0000 mg | ORAL_TABLET | Freq: Once | ORAL | Status: AC
  Administered 2020-05-24: 650 mg via ORAL

## 2020-05-24 MED ORDER — DIPHENHYDRAMINE HCL 25 MG PO CAPS
ORAL_CAPSULE | ORAL | Status: AC
  Filled 2020-05-24: qty 1

## 2020-05-24 MED ORDER — ACETAMINOPHEN 325 MG PO TABS
ORAL_TABLET | ORAL | Status: AC
  Filled 2020-05-24: qty 2

## 2020-05-24 MED ORDER — EPOETIN ALFA-EPBX 40000 UNIT/ML IJ SOLN
INTRAMUSCULAR | Status: AC
  Filled 2020-05-24: qty 1

## 2020-05-24 MED ORDER — EPOETIN ALFA-EPBX 20000 UNIT/ML IJ SOLN: 50000.0000 [IU] | Freq: Once | INTRAMUSCULAR | Status: DC

## 2020-05-24 NOTE — Patient Instructions (Signed)
[facility] Discharge Instructions for Patients Receiving Chemotherapy  Today you received the following chemotherapy agents: Rituximab  To help prevent nausea and vomiting after your treatment, we encourage you to take your nausea medication as directed.    If you develop nausea and vomiting that is not controlled by your nausea medication, call the clinic.   BELOW ARE SYMPTOMS THAT SHOULD BE REPORTED IMMEDIATELY:  *FEVER GREATER THAN 100.5 F  *CHILLS WITH OR WITHOUT FEVER  NAUSEA AND VOMITING THAT IS NOT CONTROLLED WITH YOUR NAUSEA MEDICATION  *UNUSUAL SHORTNESS OF BREATH  *UNUSUAL BRUISING OR BLEEDING  TENDERNESS IN MOUTH AND THROAT WITH OR WITHOUT PRESENCE OF ULCERS  *URINARY PROBLEMS  *BOWEL PROBLEMS  UNUSUAL RASH Items with * indicate a potential emergency and should be followed up as soon as possible.  Feel free to call the clinic should you have any questions or concerns. The clinic phone number is [phone].  Please show the [facility] at check-in to the Emergency Department and triage nurse.  Epoetin Alfa injection What is this medicine? EPOETIN ALFA (e POE e tin AL fa) helps your body make more red blood cells. This medicine is used to treat anemia caused by chronic kidney disease, cancer chemotherapy, or HIV-therapy. It may also be used before surgery if you have anemia. This medicine may be used for other purposes; ask your health care provider or pharmacist if you have questions. COMMON BRAND NAME(S): Epogen, Procrit, Retacrit What should I tell my health care provider before I take this medicine? They need to know if you have any of these conditions:  cancer  heart disease  high blood pressure  history of blood clots  history of stroke  low levels of folate, iron, or vitamin B12 in the blood  seizures  an unusual or allergic reaction to erythropoietin, albumin, benzyl alcohol, hamster proteins, other medicines, foods,  dyes, or preservatives  pregnant or trying to get pregnant  breast-feeding How should I use this medicine? This medicine is for injection into a vein or under the skin. It is usually given by a health care professional in a hospital or clinic setting. If you get this medicine at home, you will be taught how to prepare and give this medicine. Use exactly as directed. Take your medicine at regular intervals. Do not take your medicine more often than directed. It is important that you put your used needles and syringes in a special sharps container. Do not put them in a trash can. If you do not have a sharps container, call your pharmacist or healthcare provider to get one. A special MedGuide will be given to you by the pharmacist with each prescription and refill. Be sure to read this information carefully each time. Talk to your pediatrician regarding the use of this medicine in children. While this drug may be prescribed for selected conditions, precautions do apply. Overdosage: If you think you have taken too much of this medicine contact a poison control center or emergency room at once. NOTE: This medicine is only for you. Do not share this medicine with others. What if I miss a dose? If you miss a dose, take it as soon as you can. If it is almost time for your next dose, take only that dose. Do not take double or extra doses. What may interact with this medicine? Interactions have not been studied. This list may not describe all possible interactions. Give your health care provider a list of all  the medicines, herbs, non-prescription drugs, or dietary supplements you use. Also tell them if you smoke, drink alcohol, or use illegal drugs. Some items may interact with your medicine. What should I watch for while using this medicine? Your condition will be monitored carefully while you are receiving this medicine. You may need blood work done while you are taking this medicine. This medicine may  cause a decrease in vitamin B6. You should make sure that you get enough vitamin B6 while you are taking this medicine. Discuss the foods you eat and the vitamins you take with your health care professional. What side effects may I notice from receiving this medicine? Side effects that you should report to your doctor or health care professional as soon as possible:  allergic reactions like skin rash, itching or hives, swelling of the face, lips, or tongue  seizures  signs and symptoms of a blood clot such as breathing problems; changes in vision; chest pain; severe, sudden headache; pain, swelling, warmth in the leg; trouble speaking; sudden numbness or weakness of the face, arm or leg  signs and symptoms of a stroke like changes in vision; confusion; trouble speaking or understanding; severe headaches; sudden numbness or weakness of the face, arm or leg; trouble walking; dizziness; loss of balance or coordination Side effects that usually do not require medical attention (report to your doctor or health care professional if they continue or are bothersome):  chills  cough  dizziness  fever  headaches  joint pain  muscle cramps  muscle pain  nausea, vomiting  pain, redness, or irritation at site where injected This list may not describe all possible side effects. Call your doctor for medical advice about side effects. You may report side effects to FDA at 1-800-FDA-1088. Where should I keep my medicine? Keep out of the reach of children. Store in a refrigerator between 2 and 8 degrees C (36 and 46 degrees F). Do not freeze or shake. Throw away any unused portion if using a single-dose vial. Multi-dose vials can be kept in the refrigerator for up to 21 days after the initial dose. Throw away unused medicine. NOTE: This sheet is a summary. It may not cover all possible information. If you have questions about this medicine, talk to your doctor, pharmacist, or health care  provider.  2020 Elsevier/Gold Standard (2017-05-04 08:35:19)

## 2020-05-25 ENCOUNTER — Telehealth: Payer: Self-pay | Admitting: Adult Health

## 2020-05-25 NOTE — Telephone Encounter (Signed)
No 8/16 los. No changes made to pt's schedule.

## 2020-05-26 ENCOUNTER — Other Ambulatory Visit (HOSPITAL_COMMUNITY): Payer: Self-pay | Admitting: *Deleted

## 2020-05-27 ENCOUNTER — Other Ambulatory Visit (HOSPITAL_COMMUNITY): Payer: Self-pay

## 2020-05-27 MED ORDER — AMLODIPINE BESYLATE 10 MG PO TABS: 10.0000 mg | ORAL_TABLET | Freq: Every day | ORAL | 3 refills | Status: DC

## 2020-05-28 ENCOUNTER — Ambulatory Visit (INDEPENDENT_AMBULATORY_CARE_PROVIDER_SITE_OTHER): Payer: Medicare HMO | Admitting: Cardiovascular Disease

## 2020-05-28 ENCOUNTER — Other Ambulatory Visit: Payer: Self-pay

## 2020-05-28 ENCOUNTER — Other Ambulatory Visit: Payer: Self-pay | Admitting: Pharmacist Clinician (PhC)/ Clinical Pharmacy Specialist

## 2020-05-28 ENCOUNTER — Encounter: Payer: Self-pay | Admitting: Cardiovascular Disease

## 2020-05-28 ENCOUNTER — Ambulatory Visit (INDEPENDENT_AMBULATORY_CARE_PROVIDER_SITE_OTHER): Payer: Medicare HMO

## 2020-05-28 VITALS — BP 179/54 | HR 55 | Ht 60.0 in | Wt 129.0 lb

## 2020-05-28 DIAGNOSIS — I739 Peripheral vascular disease, unspecified: Secondary | ICD-10-CM | POA: Diagnosis not present

## 2020-05-28 DIAGNOSIS — I10 Essential (primary) hypertension: Secondary | ICD-10-CM

## 2020-05-28 DIAGNOSIS — I5033 Acute on chronic diastolic (congestive) heart failure: Secondary | ICD-10-CM

## 2020-05-28 DIAGNOSIS — Z7901 Long term (current) use of anticoagulants: Secondary | ICD-10-CM

## 2020-05-28 DIAGNOSIS — I6523 Occlusion and stenosis of bilateral carotid arteries: Secondary | ICD-10-CM

## 2020-05-28 DIAGNOSIS — Z952 Presence of prosthetic heart valve: Secondary | ICD-10-CM

## 2020-05-28 DIAGNOSIS — I480 Paroxysmal atrial fibrillation: Secondary | ICD-10-CM

## 2020-05-28 LAB — POCT INR: INR: 2.8 (ref 2.0–3.0)

## 2020-05-28 MED ORDER — WARFARIN SODIUM 5 MG PO TABS: ORAL_TABLET | ORAL | 1 refills | Status: DC

## 2020-05-28 NOTE — Assessment & Plan Note (Signed)
History of carotid artery disease with Dopplers performed 02/13/2020 revealing bilateral moderate ICA stenosis.  This will repeated on annual basis.

## 2020-05-28 NOTE — Progress Notes (Signed)
05/28/2020 [name]   08/20/1941  [id number]  Primary Physician [name], [name], MD Primary Cardiologist: Lorretta Harp MD [name], [place]  HPI:  [name] is a 79 y.o.  mildly overweight, widowed Caucasian female mother of 25, grandmother to 64 grandchildren whose daughter unfortunately recently died of ALS after a long protracted course. I last spoke to her for a virtual telemedicine phone visit 12/25/2019..The patient has a history of CAD and valvular heart disease as well as PVOD. She is status post coronary artery bypass grafting x2 with a St. Jude AVR July 13, 2004. Her other problems include hypertension, hyperlipidemia, diabetes, and discontinued tobacco abuse. I dilated her left common iliac and redilated her for in-stent restenosis in 2006. We have been following her lower extremity and carotid Dopplers since that time. She does have mild to moderate right and mild left ICA stenosis which has remained stable. She is neurologically asymptomatic. She denies chest pain, shortness of breath or claudication. [name] follows her lipid profile which was done most recently in March revealing a total cholesterol of 106, LDL of 53 and HDL of 29. Her most recent carotid Doppler in June did show moderate right and moderately severe left ICA stenosis which had not changed, and her lower extremity Dopplers revealed ABIs of 1 bilaterally with bilateral iliac disease. Since I saw her last a year ago she's remained stable and is asymptomatic otherwise. She does give right total hip replacement which is scheduled to be performed in Dr. Jean Rosenthal next week. I'm going to get a 2-D echocardiogram to assess her aortic prosthesis as well as a pharmacologic Myoview stress test to stratify her symptoms been over a year since her last functional study and she is a diabetic of 10 years post bypass grafting.she did have a high-risk Myoview back in 2014 that she had a false positive  Myoview back in 2006 after her bypass surgery as well. She denies chest pain or shortness of breath. She ultimately never had her hip replacement.  She  has had laparoscopic cholecystectomy with subsequent diastolic heart failure requiring diuresis. She is also had GI bleeds requiring multiple blood transfusions. Her echo performed 02/24/2018 revealed normal LV systolic function, well-functioning aortic mechanical prosthesis with severe pulmonary hypertension and a PA pressure of 88 mmHg.  I did refer her to Dr. Aundra Dubin for evaluation treatment of pulmonary hypertension. He performed right heart cath 06/27/2018 revealing pulmonary pressures in the 60s with a PVR of 3.1 Wood units and only mildly elevated pulmonary capillary wedge pressure. He is continuing to follow her for her pulmonary hypertension.  She  has been diagnosed with eosinophilic granulomatosis with polyangiitis and is being treated with prednisone and rituximab.  She does feel clinically improved.  She does not get out of the house much.  She denies chest pain or shortness of breath.  Her last 2D echo performed 05/06/2019 revealed normal EF with pulmonary hypertension and an PA SP of 88 mmHg.  Carotid Dopplers performed 2 years ago did show moderate right and moderate to severe left ICA stenosis and lower extremity Dopplers 04/29/2018 showed a patent left iliac stent.  She denies claudication.  She does have an excellent lipid profile performed 09/11/2019 with a total cholesterol 129, LDL of 60 and HDL of 31.  Since I spoke to her 12/25/2019 she continues to do well.  She did have lower extremity arterial Doppler studies performed in our office 04/01/2020 revealing a patent  left iliac stent with a high-frequency signal in her right common iliac artery.  She is minimally ambulatory and denies claudication.  She in addition denies chest pain or shortness of breath.    Current Meds  Medication Sig  . acetaminophen (TYLENOL) 325 MG tablet  Take 650 mg by mouth every 6 (six) hours as needed for mild pain.   [name] allopurinol (ZYLOPRIM) 100 MG tablet Take 100 mg by mouth daily.  [name] ALPRAZolam (XANAX) 0.5 MG tablet Take 0.25 mg by mouth at bedtime.   [name] amLODipine (NORVASC) 10 MG tablet Take 1 tablet (10 mg total) by mouth daily.  [name] atorvastatin (LIPITOR) 80 MG tablet Take 1 tablet (80 mg total) by mouth daily at 6 PM.  . BESIVANCE 0.6 % SUSP Place 1 drop into the right eye 3 (three) times daily.  . Cholecalciferol (VITAMIN D3) 1000 UNITS CAPS Take 2,000 Units by mouth daily with lunch.   . cloNIDine (CATAPRES) 0.2 MG tablet Take 1 tablet (0.2 mg total) by mouth 2 (two) times daily.  . colchicine 0.6 MG tablet   . Cyanocobalamin (VITAMIN B12) 1000 MCG TBCR Take 1,000 mcg by mouth daily with lunch.   . DUREZOL 0.05 % EMUL Place 1 drop into the right eye 3 (three) times daily.  . furosemide (LASIX) 80 MG tablet Take 1 tablet (80 mg total) by mouth daily.  [name] gatifloxacin (ZYMAXID) 0.5 % SOLN Place 1 drop into the right eye 4 (four) times daily.   . hydrALAZINE (APRESOLINE) 100 MG tablet TAKE 1 TABLET THREE TIMES DAILY  . ketorolac (ACULAR) 0.5 % ophthalmic solution Place 1 drop into the right eye 4 (four) times daily.  [name] lactose free nutrition (BOOST) LIQD Take 237 mLs by mouth daily. (To supplement meals)   . magnesium oxide (MAG-OX) 400 (241.3 Mg) MG tablet Take 1 tablet (400 mg total) by mouth daily.  . metFORMIN (GLUCOPHAGE) 500 MG tablet Take 500 mg by mouth 2 (two) times daily with a meal.   . metoprolol succinate (TOPROL XL) 25 MG 24 hr tablet Take 0.5 tablets (12.5 mg total) by mouth daily.  . pantoprazole (PROTONIX) 40 MG tablet Take 40 mg by mouth 2 (two) times daily.   [name] PROLENSA 0.07 % SOLN SMARTSIG:1 Drop(s) Right Eye Every Evening  . spironolactone (ALDACTONE) 25 MG tablet Take 1 tablet (25 mg total) by mouth daily.  . tadalafil, PAH, (ADCIRCA) 20 MG tablet TAKE 2 TABLETS DAILY  . tobramycin (TOBREX) 0.3 % ophthalmic solution    . vitamin C (ASCORBIC ACID) 500 MG tablet Take 500 mg by mouth daily with lunch.  . warfarin (COUMADIN) 5 MG tablet TAKE 1.5 TO 2 TABLETS BY MOUTH DAILY AS DIRECTED BY COUMADIN CLINIC     Allergies  Allergen Reactions  . Flagyl [Metronidazole] Rash    ALL-OVER BODY RASH  . Coreg [Carvedilol] Other (See Comments)    Terrible cramping in the feet and had a lot of bowel movements, but not diarrhea  . Losartan Swelling    Patient doesn't recall site of swelling  . Verapamil Hives  . Zetia [Ezetimibe] Other (See Comments)    Reaction not recalled  . Zocor [Simvastatin - High Dose] Other (See Comments)    Reaction not recalled  . Gatifloxacin Rash    Redness to skin around eye    Social History   Socioeconomic History  . Marital status: Widowed    Spouse name: Not on file  . Number of children: Not on file  .  Years of education: Not on file  . Highest education level: Not on file  Occupational History  . Occupation: Retired  Tobacco Use  . Smoking status: Former Smoker    Packs/day: 1.00    Years: 30.00    Pack years: 30.00    Types: Cigarettes  . Smokeless tobacco: Never Used  . Tobacco comment: quit smoking 2005  Vaping Use  . Vaping Use: Never used  Substance and Sexual Activity  . Alcohol use: No    Alcohol/week: 0.0 standard drinks  . Drug use: No  . Sexual activity: Not Currently  Other Topics Concern  . Not on file  Social History Narrative  . Not on file   Social Determinants of Health   Financial Resource Strain:   . Difficulty of Paying Living Expenses: Not on file  Food Insecurity: No Food Insecurity  . Worried About Charity fundraiser in the Last Year: Never true  . Ran Out of Food in the Last Year: Never true  Transportation Needs: No Transportation Needs  . Lack of Transportation (Medical): No  . Lack of Transportation (Non-Medical): No  Physical Activity:   . Days of Exercise per Week: Not on file  . Minutes of Exercise per Session: Not on  file  Stress:   . Feeling of Stress : Not on file  Social Connections: Socially Isolated  . Frequency of Communication with Friends and Family: More than three times a week  . Frequency of Social Gatherings with Friends and Family: Once a week  . Attends Religious Services: Never  . Active Member of Clubs or Organizations: No  . Attends Archivist Meetings: Never  . Marital Status: Widowed  Intimate Partner Violence:   . Fear of Current or Ex-Partner: Not on file  . Emotionally Abused: Not on file  . Physically Abused: Not on file  . Sexually Abused: Not on file     Review of Systems: General: negative for chills, fever, night sweats or weight changes.  Cardiovascular: negative for chest pain, dyspnea on exertion, edema, orthopnea, palpitations, paroxysmal nocturnal dyspnea or shortness of breath Dermatological: negative for rash Respiratory: negative for cough or wheezing Urologic: negative for hematuria Abdominal: negative for nausea, vomiting, diarrhea, bright red blood per rectum, melena, or hematemesis Neurologic: negative for visual changes, syncope, or dizziness All other systems reviewed and are otherwise negative except as noted above.    Blood pressure (!) 179/54, pulse (!) 55, height 5' (1.524 m), weight 129 lb (58.5 kg), SpO2 97 %.  General appearance: alert and no distress Neck: no adenopathy, no JVD, supple, symmetrical, trachea midline, thyroid not enlarged, symmetric, no tenderness/mass/nodules and Mild bilateral carotid bruits Lungs: clear to auscultation bilaterally Heart: 2/6 systolic ejection murmur at the base with crisp mechanical valve sounds. Extremities: extremities normal, atraumatic, no cyanosis or edema Pulses: 2+ and symmetric Skin: Skin color, texture, turgor normal. No rashes or lesions Neurologic: Alert and oriented X 3, normal strength and tone. Normal symmetric reflexes. Normal coordination and gait  EKG not performed  today  ASSESSMENT AND PLAN:   Hypertension History of essential hypertension blood pressure measured today 179/54.  This is slightly higher than it usually runs.  She is on amlodipine, clonidine, hydralazine, metoprolol and spironolactone.  She is not on an ACE inhibitor or ARB because of moderate renal insufficiency.  Hypercholesteremia History of hyperlipidemia on statin therapy with lipid profile performed 09/11/2019 revealing total cholesterol 127, LDL of 60 and HDL 31.  Mechanical heart valve  present History of Saint Jude AVR at the time of her CABG x2 July 13, 2004.  This is followed on an annual basis by 2D echocardiography most recently checked 02/05/2020.  She had normal LV systolic function with mild to moderate MR and a well-functioning aortic mechanical prosthesis with a valve area measured at 1 cm.  She is on Coumadin anticoagulation.  Carotid artery disease (HCC) History of carotid artery disease with Dopplers performed 02/13/2020 revealing bilateral moderate ICA stenosis.  This will repeated on annual basis.  Peripheral arterial disease (HCC) History of PAD status post remote left iliac stenting by myself in 2002 with reintervention for in-stent restenosis in 2006.  She currently denies claudication although she is minimally ambulatory.  She did have lower extremity arterial Doppler studies performed 04/01/2020.  ABIs were unobtainable because of noncompressible vessels.  Her stent appeared widely patent.  She did appear to have a high-frequency signal in her right common iliac artery which she is asymptomatic from.  Acute on chronic diastolic heart failure (HCC) Chronic diastolic heart failure on diuretic therapy.  PAF (paroxysmal atrial fibrillation) (HCC) History of PAF maintaining sinus rhythm on Coumadin anticoagulation.      Lorretta Harp MD FACP,FACC,FAHA, [facility] 05/28/2020 3:40 PM

## 2020-05-28 NOTE — Assessment & Plan Note (Signed)
History of PAF maintaining sinus rhythm on Coumadin anticoagulation 

## 2020-05-28 NOTE — Patient Instructions (Signed)
continue with warfarin 7.5mg  daily. INR in 6 weeks.  Call [phone] if you need to change the appointment

## 2020-05-28 NOTE — Assessment & Plan Note (Signed)
History of essential hypertension blood pressure measured today 179/54.  This is slightly higher than it usually runs.  She is on amlodipine, clonidine, hydralazine, metoprolol and spironolactone.  She is not on an ACE inhibitor or ARB because of moderate renal insufficiency.

## 2020-05-28 NOTE — Patient Outreach (Signed)
[name] [facility]) Care Management  05/28/2020  [name] 10-28-1940 [id number]   Telephone call to patient for disease management follow up.   No answer.  Line busy after several attempts.    Plan: If no return call, RN CM will attempt patient again in the month of October.   [name], RN, MSN [facility] Care Management Coordinator Direct Line [phone] Cell [phone] Toll Free: [phone]  Fax: [phone]

## 2020-05-28 NOTE — Assessment & Plan Note (Signed)
History of Saint Jude AVR at the time of her CABG x2 July 13, 2004.  This is followed on an annual basis by 2D echocardiography most recently checked 02/05/2020.  She had normal LV systolic function with mild to moderate MR and a well-functioning aortic mechanical prosthesis with a valve area measured at 1 cm.  She is on Coumadin anticoagulation.

## 2020-05-28 NOTE — Patient Instructions (Addendum)
Medication Instructions:  Your Physician recommend you continue on your current medication as directed.    *If you need a refill on your cardiac medications before your next appointment, please call your pharmacy*   Lab Work: None   Testing/Procedures: Your physician has requested that you have an echocardiogram April 2022. Echocardiography is a painless test that uses sound waves to create images of your heart. It provides your doctor with information about the size and shape of your heart and how well your hearts chambers and valves are working. This procedure takes approximately one hour. There are no restrictions for this procedure. [place] has requested that you have a carotid duplex May 2022. This test is an ultrasound of the carotid arteries in your neck. It looks at blood flow through these arteries that supply the brain with blood. Allow one hour for this exam. There are no restrictions or special instructions. [place]  Your physician has requested that you have a lower extremity arterial exercise duplex in June 2022 . During this test, exercise and ultrasound are used to evaluate arterial blood flow in the legs. Allow one hour for this exam. There are no restrictions or special instructions. [address]  Follow-Up: At [facility], you and your health needs are our priority.  As part of our continuing mission to provide you with exceptional heart care, we have created designated Provider Care Teams.  These Care Teams include your primary Cardiologist (physician) and Advanced Practice Providers (APPs -  Physician Assistants and Nurse Practitioners) who all work together to provide you with the care you need, when you need it.  We recommend signing up for the patient portal called "MyChart".  Sign up information is provided on this After Visit Summary.  MyChart is used to connect with patients for  Virtual Visits (Telemedicine).  Patients are able to view lab/test results, encounter notes, upcoming appointments, etc.  Non-urgent messages can be sent to your provider as well.   To learn more about what you can do with MyChart, go to [email or web address].    Your next appointment:   1 year  The format for your next appointment:   In Person  Provider:   Quay Burow, MD

## 2020-05-28 NOTE — Assessment & Plan Note (Signed)
History of PAD status post remote left iliac stenting by myself in 2002 with reintervention for in-stent restenosis in 2006.  She currently denies claudication although she is minimally ambulatory.  She did have lower extremity arterial Doppler studies performed 04/01/2020.  ABIs were unobtainable because of noncompressible vessels.  Her stent appeared widely patent.  She did appear to have a high-frequency signal in her right common iliac artery which she is asymptomatic from.

## 2020-05-28 NOTE — Assessment & Plan Note (Signed)
History of hyperlipidemia on statin therapy with lipid profile performed 09/11/2019 revealing total cholesterol 127, LDL of 60 and HDL 31.

## 2020-05-28 NOTE — Assessment & Plan Note (Signed)
Chronic diastolic heart failure on diuretic therapy.

## 2020-06-03 ENCOUNTER — Other Ambulatory Visit (HOSPITAL_COMMUNITY): Payer: Self-pay

## 2020-06-03 MED ORDER — METOPROLOL SUCCINATE ER 25 MG PO TB24
12.5000 mg | ORAL_TABLET | Freq: Every day | ORAL | 3 refills | Status: DC
Start: 2020-06-03 — End: 2021-07-19

## 2020-06-08 ENCOUNTER — Other Ambulatory Visit (HOSPITAL_COMMUNITY): Payer: Self-pay | Admitting: Cardiology

## 2020-06-15 ENCOUNTER — Other Ambulatory Visit (HOSPITAL_COMMUNITY): Payer: Self-pay | Admitting: Cardiology

## 2020-06-15 MED ORDER — TADALAFIL (PAH) 20 MG PO TABS
40.0000 mg | ORAL_TABLET | Freq: Every day | ORAL | 11 refills | Status: DC
Start: 2020-06-15 — End: 2020-12-09

## 2020-06-20 NOTE — Progress Notes (Signed)
[place]  [phone] [phone]    ID: [name] DOB: 13-Nov-1940  MR#: [id number]  EUM#:[id number]  Patient Care Team: [name], MD as PCP - General (Internal Medicine) [name], MD as PCP - Advanced Heart Failure (Cardiology) Lorretta Harp, MD as PCP - Cardiology (Cardiology) Wilma Michaelson, [name], MD as Consulting Physician (Hematology and Oncology) Arta Silence, MD as Consulting Physician (Gastroenterology) [name], MD as Consulting Physician (Cardiology) Marzetta Board, DPM as Consulting Physician (Podiatry) Gavin Pound, MD as Consulting Physician (Rheumatology) Jon Billings, RN as [facility] Corliss Parish, MD as Consulting Physician (Nephrology) OTHER MD:   CHIEF COMPLAINT: anemia of renal failure; vasculitis  CURRENT TREATMENT: [Feraheme], Retacrit, rituximab   INTERVAL HISTORY: [name] returns today for follow-up and treatment of her multifactorial anemia and vasculitis.  She receives Retacrit every 4 weeks, and rituximab every 8 weeks.  We are considering broadening the rituximab to every 12 weeks depending on her counts.  She is due for retacrit today.  Her hemoglobin remains above 10 and her ferritin level has remained above 100.   Results for [name], [name] (MRN [id number]) as of 06/21/2020 11:34  Ref. Range 02/02/2020 10:27 03/01/2020 11:05 03/30/2020 09:32 04/26/2020 10:55 05/24/2020 11:00  Ferritin Latest Ref Range: 11 - 307 ng/mL 185 169 169 186 217  Results for [name], [name] (MRN [id number]) as of 06/21/2020 11:34  Ref. Range 03/01/2020 11:06 03/30/2020 09:32 04/26/2020 10:54 05/24/2020 11:00 06/21/2020 10:53  Hemoglobin Latest Ref Range: 12.0 - 15.0 g/dL 10.6 (L) 10.8 (L) 10.5 (L) 10.5 (L) 10.3 (L)    REVIEW OF SYSTEMS: [name] is tolerating her treatments remarkably well and essentially has no side effects from either the Retacrit or the rituximab  infusions.  Transportation is not a problem as her neighbor usually brings her, occasionally her daughter.  Currently she has no fevers, rash, drenching sweats, or significant change in her weight.  She has an excellent functional status for her age, lives independently, does all her chores, and today she hung some shelves she says.  She is fully vaccinated.  A detailed review of systems was otherwise stable.     HISTORY OF CURRENT ILLNESS: From the original consult note:  [name] is an 79 y.o. female from [place], [place].  She has a past medical history significant forPAD s/p stenting; moderate to severe pulmonary HTN; afib and AVR on Coumadin; HTN; HLD; DM; and CAD s/p CABG. the patient was admitted to the hospital for anemia.  She was having increasing fatigue and shortness of breath and had routine lab work drawn which showed a hemoglobin of 6.6.  Due to her abnormal lab work, she was advised to come to the emergency room for admission.  She was also admitted in February 2020 for symptomatic anemia.  She reports she has been anemic for at least a year and has been taking oral iron.  She underwent a colonoscopy and EGD on 01/22/2018.  The colonoscopy showed diverticulosis in the sigmoid colon and descending colon, internal hemorrhoids.  The upper endoscopy was normal except for erythematous mucosa in the antrum.  She had a repeat EGD on 11/21/2018 that showed a few minimally oozing( with scope trauma) superficial gastric ulcers with pigmented material were found in the gastric body. The largest lesion was 4 mm in largest dimension.  She underwent another upper endoscopy on 02/27/2019 which showed patchy mildly friable mucosa with contact bleeding was found  in the gastric fundus, in the gastric body and in the gastric antrum.  The patient is maintained on warfarin due to A. fib and a mechanical aortic valve.  Review of the patient's chart shows that she has been anemic dating back to at least  December thousand 12 (these are the earliest labs available to me).  Her highest hemoglobin was 11.6 in December 2012.  I also note that the patient has had intermittent mild leukopenia adding back to at least April 2019.  Her lowest white blood cell count was 2.6 in April 2019.  Since admission, the patient has received 1 unit of packed red blood cells.  She is currently on heparin for her procedure with plan to bridge back to Coumadin.  Stool for occult blood was negative x1 on 02/24/2019.  Hematology was asked see the patient to make recommendations regarding her anemia.  [name] subsequent history is as detailed below.   PAST MEDICAL HISTORY: Past Medical History:  Diagnosis Date  . Aortic atherosclerosis ([place]) 01/23/2018  . Atrial fibrillation ([place])   . Carotid artery disease ([place])   . Cholelithiasis 01/23/2018  . Chronic anticoagulation   . Coronary artery disease    status post coronary artery bypass grafting times 07/10/2004  . Diabetes mellitus   . Hypercholesteremia   . Hypertension   . Mechanical heart valve present    H. aortic valve replacement at the time of bypass surgery October 2005  . Moderate to severe pulmonary hypertension ([place])   . Peripheral arterial disease ([facility])    history of left common iliac artery PTA and stenting for a chronic total occlusion 08/26/01    PAST SURGICAL HISTORY: Past Surgical History:  Procedure Laterality Date  . AORTIC VALVE REPLACEMENT    . CARDIAC CATHETERIZATION  11/10/2004   40% right common illiac, 70% in stent restenosis of distal left common illiac,   . CARDIAC CATHETERIZATION  05/18/2004   LAD 50-70% midstenosis, RCA dominant w/50% stenosis, 50% Right common Illiac artery ostial stenosis, 90% in stent restenosis within midportion of left common illiac stent  . Carotid Duplex  03/12/2012   RSA-elev. velocities suggestive of a 50-69% diameter reduction, Right&Left Bulb/Prox ICA-mild-mod.fibrous plaqueelevating Velocities  abnormal study.  . CHOLECYSTECTOMY N/A 03/01/2018   Procedure: LAPAROSCOPIC CHOLECYSTECTOMY;  Surgeon: Judeth Horn, MD;  Location: [facility];  Service: General;  Laterality: N/A;  . COLONOSCOPY WITH PROPOFOL N/A 01/22/2018   Procedure: COLONOSCOPY WITH PROPOFOL;  Surgeon: Wilford Corner, MD;  Location: [facility];  Service: Endoscopy;  Laterality: N/A;  . ESOPHAGOGASTRODUODENOSCOPY (EGD) WITH PROPOFOL N/A 01/22/2018   Procedure: ESOPHAGOGASTRODUODENOSCOPY (EGD) WITH PROPOFOL;  Surgeon: Wilford Corner, MD;  Location: [facility];  Service: Endoscopy;  Laterality: N/A;  . ESOPHAGOGASTRODUODENOSCOPY (EGD) WITH PROPOFOL N/A 11/21/2018   Procedure: ESOPHAGOGASTRODUODENOSCOPY (EGD) WITH PROPOFOL;  Surgeon: [name], MD;  Location: [facility];  Service: Gastroenterology;  Laterality: N/A;  . ESOPHAGOGASTRODUODENOSCOPY (EGD) WITH PROPOFOL Left 02/27/2019   Procedure: ESOPHAGOGASTRODUODENOSCOPY (EGD) WITH PROPOFOL;  Surgeon: Arta Silence, MD;  Location[facility] ENDOSCOPY;  Service: Endoscopy;  Laterality: Left;  . GIVENS CAPSULE STUDY N/A 11/21/2018   Procedure: GIVENS CAPSULE STUDY;  Surgeon: [name], MD;  Location: [facility];  Service: Gastroenterology;  Laterality: N/A;  To be deployed during EGD  . Lower Ext. Duplex  03/12/2012   Right Proximal CIA- vessel narrowing w/elevated velocities 0-49% diameter reduction. Right SFA-mild mixed density plaque throughout vessel.  [name] NM MYOCAR PERF WALL MOTION  05/19/2010   protocol: Persantine, post stress EF  65%, negative for ischemia, low risk scan  . RIGHT HEART CATH N/A 06/27/2018   Procedure: RIGHT HEART CATH;  Surgeon: [name], MD;  Location: [facility] CV LAB;  Service: Cardiovascular;  Laterality: N/A;  . TRANSTHORACIC ECHOCARDIOGRAM  08/29/2012   Moderately calcified annulus of mitral valve, moderate regurg. of both mitral valve and tricuspid valve.     FAMILY HISTORY: Family History  Problem Relation Age of Onset  . Breast cancer  Neg Hx     GYNECOLOGIC HISTORY:  No LMP recorded. Patient has had a hysterectomy. Menarche:  years old Age at first live birth:  [place] P: 3 LMP:  Contraceptive:  HRT:   Hysterectomy?: yes BSO?:    SOCIAL HISTORY: (Current as of 03/07/2019) Avionna is widowed.  She lives by herself.  She has 3 children that live locally.  She smoked three quarters of a pack of cigarettes daily for 50 years.  She quit in 2005.  Denies alcohol use.   ADVANCED DIRECTIVES: Not in place    HEALTH MAINTENANCE: Social History   Tobacco Use  . Smoking status: Former Smoker    Packs/day: 1.00    Years: 30.00    Pack years: 30.00    Types: Cigarettes  . Smokeless tobacco: Never Used  . Tobacco comment: quit smoking 2005  Vaping Use  . Vaping Use: Never used  Substance Use Topics  . Alcohol use: No    Alcohol/week: 0.0 standard drinks  . Drug use: No    Colonoscopy: EGD on 02/27/2019 under Dr. Paulita Fujita found some friable gastric mucosa.  Colonoscopy under Dr. Michail Sermon 01/22/2018 showed significant diverticular disease  PAP:   Bone density: May 2017 showed osteopenia Mammography: 02/25/2017, breast density category B.  Allergies  Allergen Reactions  . Flagyl [Metronidazole] Rash    ALL-OVER BODY RASH  . Coreg [Carvedilol] Other (See Comments)    Terrible cramping in the feet and had a lot of bowel movements, but not diarrhea  . Losartan Swelling    Patient doesn't recall site of swelling  . Verapamil Hives  . Zetia [Ezetimibe] Other (See Comments)    Reaction not recalled  . Zocor [Simvastatin - High Dose] Other (See Comments)    Reaction not recalled  . Gatifloxacin Rash    Redness to skin around eye    Current Outpatient Medications  Medication Sig Dispense Refill  . acetaminophen (TYLENOL) 325 MG tablet Take 650 mg by mouth every 6 (six) hours as needed for mild pain.     [name] allopurinol (ZYLOPRIM) 100 MG tablet Take 100 mg by mouth daily.    [name] ALPRAZolam (XANAX) 0.5 MG tablet Take 0.25  mg by mouth at bedtime.     [name] amLODipine (NORVASC) 10 MG tablet Take 1 tablet (10 mg total) by mouth daily. 90 tablet 3  . atorvastatin (LIPITOR) 80 MG tablet Take 1 tablet (80 mg total) by mouth daily at 6 PM.    . BESIVANCE 0.6 % SUSP Place 1 drop into the right eye 3 (three) times daily.    . Cholecalciferol (VITAMIN D3) 1000 UNITS CAPS Take 2,000 Units by mouth daily with lunch.     . cloNIDine (CATAPRES) 0.2 MG tablet TAKE 1 TABLET TWICE DAILY 180 tablet 3  . colchicine 0.6 MG tablet     . Cyanocobalamin (VITAMIN B12) 1000 MCG TBCR Take 1,000 mcg by mouth daily with lunch.     . DUREZOL 0.05 % EMUL Place 1 drop into the right eye 3 (three)  times daily.    . furosemide (LASIX) 80 MG tablet Take 1 tablet (80 mg total) by mouth daily. 90 tablet 3  . gatifloxacin (ZYMAXID) 0.5 % SOLN Place 1 drop into the right eye 4 (four) times daily.     . hydrALAZINE (APRESOLINE) 100 MG tablet TAKE 1 TABLET THREE TIMES DAILY 270 tablet 3  . ketorolac (ACULAR) 0.5 % ophthalmic solution Place 1 drop into the right eye 4 (four) times daily.    [name] lactose free nutrition (BOOST) LIQD Take 237 mLs by mouth daily. (To supplement meals)     . magnesium oxide (MAG-OX) 400 (241.3 Mg) MG tablet Take 1 tablet (400 mg total) by mouth daily.    . metFORMIN (GLUCOPHAGE) 500 MG tablet Take 500 mg by mouth 2 (two) times daily with a meal.     . metoprolol succinate (TOPROL XL) 25 MG 24 hr tablet Take 0.5 tablets (12.5 mg total) by mouth daily. 45 tablet 3  . pantoprazole (PROTONIX) 40 MG tablet Take 40 mg by mouth 2 (two) times daily.     [name] PROLENSA 0.07 % SOLN SMARTSIG:1 Drop(s) Right Eye Every Evening    . spironolactone (ALDACTONE) 25 MG tablet Take 1 tablet (25 mg total) by mouth daily. 90 tablet 3  . tadalafil, PAH, (ADCIRCA) 20 MG tablet Take 2 tablets (40 mg total) by mouth daily. 60 tablet 11  . tobramycin (TOBREX) 0.3 % ophthalmic solution     . vitamin C (ASCORBIC ACID) 500 MG tablet Take 500 mg by mouth daily  with lunch.    . warfarin (COUMADIN) 5 MG tablet TAKE 1.5 TO 2 TABLETS BY MOUTH DAILY AS DIRECTED BY COUMADIN CLINIC 90 tablet 1   No current facility-administered medications for this visit.    OBJECTIVE: Older white woman using a cane Vitals:   06/21/20 1117  BP: (!) 139/51  Pulse: (!) 48  Resp: 17  Temp: (!) 97.5 F (36.4 C)  SpO2: 97%   Wt Readings from Last 3 Encounters:  06/21/20 128 lb (58.1 kg)  05/28/20 129 lb (58.5 kg)  05/24/20 130 lb 1.6 oz (59 kg)   Body mass index is 25 kg/m.    ECOG FS:2 - Symptomatic, <50% confined to bed  Sclerae unicteric, EOMs intact Wearing a mask No cervical or supraclavicular adenopathy Lungs no rales or rhonchi Heart systolic murmur as previously noted Abd soft, nontender, positive bowel sounds MSK no focal spinal tenderness Neuro: nonfocal, well oriented, appropriate affect Breasts: Deferred   LAB RESULTS:  CMP     Component Value Date/Time   NA 133 (L) 03/30/2020 0932   NA 137 12/06/2017 1436   K 4.4 03/30/2020 0932   CL 103 03/30/2020 0932   CO2 20 (L) 03/30/2020 0932   GLUCOSE 152 (H) 03/30/2020 0932   BUN 57 (H) 03/30/2020 0932   BUN 20 12/06/2017 1436   CREATININE 1.73 (H) 03/30/2020 0932   CALCIUM 9.3 03/30/2020 0932   PROT 6.3 (L) 03/30/2020 0932   ALBUMIN 3.6 03/30/2020 0932   AST 21 03/30/2020 0932   ALT 16 03/30/2020 0932   ALKPHOS 83 03/30/2020 0932   BILITOT 0.4 03/30/2020 0932   GFRNONAA 27 (L) 03/30/2020 0932   GFRAA 32 (L) 03/30/2020 0932    Lab Results  Component Value Date   TOTALPROTELP 6.9 05/07/2019    No results found for: KPAFRELGTCHN, LAMBDASER, KAPLAMBRATIO  Lab Results  Component Value Date   WBC 6.4 06/21/2020   NEUTROABS 3.9 06/21/2020   HGB  10.3 (L) 06/21/2020   HCT 31.5 (L) 06/21/2020   MCV 92.9 06/21/2020   PLT 138 (L) 06/21/2020    No results found for: LABCA2  No components found for: UDJSHF026  No results for input(s): INR in the last 168 hours.  No results  found for: LABCA2  No results found for: VZC588  No results found for: FOY774  No results found for: JOI786  No results found for: CA2729  No components found for: HGQUANT  No results found for: CEA1 / No results found for: CEA1   No results found for: AFPTUMOR  No results found for: CHROMOGRNA  No results found for: HGBA, HGBA2QUANT, HGBFQUANT, HGBSQUAN (Hemoglobinopathy evaluation)   Lab Results  Component Value Date   LDH 212 (H) 08/04/2019    Lab Results  Component Value Date   IRON 123 03/30/2020   TIBC 268 03/30/2020   IRONPCTSAT 46 03/30/2020   (Iron and TIBC)  Lab Results  Component Value Date   FERRITIN 217 05/24/2020    Urinalysis    Component Value Date/Time   COLORURINE YELLOW 07/07/2019 0209   APPEARANCEUR CLOUDY (A) 07/07/2019 0209   LABSPEC 1.011 07/07/2019 0209   PHURINE 6.0 07/07/2019 0209   GLUCOSEU NEGATIVE 07/07/2019 0209   HGBUR NEGATIVE 07/07/2019 0209   BILIRUBINUR NEGATIVE 07/07/2019 0209   KETONESUR NEGATIVE 07/07/2019 0209   PROTEINUR >=300 (A) 07/07/2019 0209   UROBILINOGEN 1.0 10/06/2011 1709   NITRITE NEGATIVE 07/07/2019 0209   LEUKOCYTESUR LARGE (A) 07/07/2019 0209    STUDIES:  No results found.   ELIGIBLE FOR AVAILABLE RESEARCH PROTOCOL: no   ASSESSMENT: 79 y.o. [place] woman with multifactorial anemia, as follows:             (a) anemia of chronic inflammation: As of May 2020 she has a SED rate of 95, CRP 1.1, positive ANA and RF; subsequently she was found to be ANCA positive             (b) hemolysis (mild): she has a mildly elevated LDH and a DAT positive for complement, not IgG; this is c/w (a) above             (c) anemia of renal insufficiency: inadequate EPO resonse to anemia              (d) GIB/ iron deficiency in patient on lifelong anticoagulaton  (1) Feraheme received 03/01/2019, repeated 03/20/2019   (a) reticulocyte 03/07/2019 up from 43.4 a year ago to 191.8 after iron infusion  (b) feraheme  repeated on 05/12/2019  (c) subsequent ferritin levels >100  (2) darbepoetin: starting 05/05/2019 at 130mcg given every 2 weeks  (a) dose increased to 200 mcg every week starting 07/01/2019   (b) back to every 2 weeks beginning 08/04/2019 still at 200 mcg dose  (3) ANCA positive vasculitis: diagnosed 05/08/2019 (titer 1:640)  (a) prednisone 40 mg daily started 05/31/2019  (b) rituximab weekly started 06/12/2019 (received test dose 06/05/2019)   (i) hepatitis B sAg, core Ab and HIV negative 05/08/2019   (ii) rituximab held after 07/14/2019 dose (received 5 weekly doses) with neutropenia   (iii) rituximab resumed 08/18/2019 but changed to monthly   (iv) rituximab changed to every 2 months beginning with 02/02/2020 dose  (c) prednisone tapered to off as of 09/15/2019  (4) osteopenia with a T score of -1.5 on bone density 02/28/2016 at the breast center    PLAN: Skyelynn is very stable on her current treatments.  I am not making any changes  on the Retacrit dose or dosing interval.  This is maintaining her hemoglobin above 10, which is adequate and allows her to function normally for her age  She receives the rituximab now every 8 weeks with very stable counts.  I am going to see if we can possibly stretch that to every 12 weeks and then if so we will keep her on that dose interval for the next year.  She will see me in November which would be her first rituximab 48-month dose for assessment of her counts  She knows to call for any other issue that may develop before the next visit  Total encounter time 30 minutes.Sarajane Jews C. Alwyn Cordner, MD 06/21/20 11:22 AM Medical Oncology and Hematology [facility] [place], [place] [address] Tel. [phone]    Fax. [phone]   I, Wilburn Mylar, am acting as scribe for Dr. [name]. Konor Noren.  I, Lurline Del MD, have reviewed the above documentation for accuracy and completeness, and I agree with the  above.    *Total Encounter Time as defined by the Centers for Medicare and Medicaid Services includes, in addition to the face-to-face time of a patient visit (documented in the note above) non-face-to-face time: obtaining and reviewing outside history, ordering and reviewing medications, tests or procedures, care coordination (communications with other health care professionals or caregivers) and documentation in the medical record.

## 2020-06-21 ENCOUNTER — Ambulatory Visit: Payer: Medicare HMO

## 2020-06-21 ENCOUNTER — Other Ambulatory Visit: Payer: Self-pay

## 2020-06-21 ENCOUNTER — Other Ambulatory Visit: Payer: Medicare HMO

## 2020-06-21 ENCOUNTER — Inpatient Hospital Stay: Payer: Medicare HMO

## 2020-06-21 ENCOUNTER — Inpatient Hospital Stay (HOSPITAL_BASED_OUTPATIENT_CLINIC_OR_DEPARTMENT_OTHER): Payer: Medicare HMO | Admitting: Oncology

## 2020-06-21 ENCOUNTER — Inpatient Hospital Stay: Payer: Medicare HMO | Attending: Oncology

## 2020-06-21 VITALS — BP 139/51 | HR 48 | Temp 97.5°F | Resp 17 | Ht 60.0 in | Wt 128.0 lb

## 2020-06-21 DIAGNOSIS — D594 Other nonautoimmune hemolytic anemias: Secondary | ICD-10-CM

## 2020-06-21 DIAGNOSIS — D696 Thrombocytopenia, unspecified: Secondary | ICD-10-CM

## 2020-06-21 DIAGNOSIS — D631 Anemia in chronic kidney disease: Secondary | ICD-10-CM | POA: Diagnosis not present

## 2020-06-21 DIAGNOSIS — D508 Other iron deficiency anemias: Secondary | ICD-10-CM

## 2020-06-21 DIAGNOSIS — N289 Disorder of kidney and ureter, unspecified: Secondary | ICD-10-CM | POA: Diagnosis not present

## 2020-06-21 DIAGNOSIS — Z952 Presence of prosthetic heart valve: Secondary | ICD-10-CM

## 2020-06-21 DIAGNOSIS — D61818 Other pancytopenia: Secondary | ICD-10-CM

## 2020-06-21 DIAGNOSIS — D500 Iron deficiency anemia secondary to blood loss (chronic): Secondary | ICD-10-CM

## 2020-06-21 DIAGNOSIS — N189 Chronic kidney disease, unspecified: Secondary | ICD-10-CM | POA: Insufficient documentation

## 2020-06-21 DIAGNOSIS — I7782 Antineutrophilic cytoplasmic antibody (ANCA) vasculitis: Secondary | ICD-10-CM

## 2020-06-21 DIAGNOSIS — I776 Arteritis, unspecified: Secondary | ICD-10-CM

## 2020-06-21 DIAGNOSIS — I739 Peripheral vascular disease, unspecified: Secondary | ICD-10-CM

## 2020-06-21 LAB — CBC WITH DIFFERENTIAL/PLATELET
Abs Immature Granulocytes: 0.06 10*3/uL (ref 0.00–0.07)
Basophils Absolute: 0.1 10*3/uL (ref 0.0–0.1)
Basophils Relative: 1 %
Eosinophils Absolute: 0.3 10*3/uL (ref 0.0–0.5)
Eosinophils Relative: 5 %
HCT: 31.5 % — ABNORMAL LOW (ref 36.0–46.0)
Hemoglobin: 10.3 g/dL — ABNORMAL LOW (ref 12.0–15.0)
Immature Granulocytes: 1 %
Lymphocytes Relative: 22 %
Lymphs Abs: 1.4 10*3/uL (ref 0.7–4.0)
MCH: 30.4 pg (ref 26.0–34.0)
MCHC: 32.7 g/dL (ref 30.0–36.0)
MCV: 92.9 fL (ref 80.0–100.0)
Monocytes Absolute: 0.7 10*3/uL (ref 0.1–1.0)
Monocytes Relative: 11 %
Neutro Abs: 3.9 10*3/uL (ref 1.7–7.7)
Neutrophils Relative %: 60 %
Platelets: 138 10*3/uL — ABNORMAL LOW (ref 150–400)
RBC: 3.39 MIL/uL — ABNORMAL LOW (ref 3.87–5.11)
RDW: 15.2 % (ref 11.5–15.5)
WBC: 6.4 10*3/uL (ref 4.0–10.5)
nRBC: 0 % (ref 0.0–0.2)

## 2020-06-21 LAB — RETICULOCYTES
Immature Retic Fract: 5.2 % (ref 2.3–15.9)
RBC.: 3.26 MIL/uL — ABNORMAL LOW (ref 3.87–5.11)
Retic Count, Absolute: 42.1 10*3/uL (ref 19.0–186.0)
Retic Ct Pct: 1.3 % (ref 0.4–3.1)

## 2020-06-21 LAB — FERRITIN: Ferritin: 273 ng/mL (ref 11–307)

## 2020-06-21 LAB — SAVE SMEAR(SSMR), FOR PROVIDER SLIDE REVIEW

## 2020-06-21 MED ORDER — EPOETIN ALFA-EPBX 40000 UNIT/ML IJ SOLN
40000.0000 [IU] | Freq: Once | INTRAMUSCULAR | Status: AC
  Administered 2020-06-21: 40000 [IU] via SUBCUTANEOUS

## 2020-06-21 MED ORDER — EPOETIN ALFA-EPBX 40000 UNIT/ML IJ SOLN
INTRAMUSCULAR | Status: AC
  Filled 2020-06-21: qty 1

## 2020-06-21 MED ORDER — EPOETIN ALFA-EPBX 10000 UNIT/ML IJ SOLN
INTRAMUSCULAR | Status: AC
  Filled 2020-06-21: qty 1

## 2020-06-21 MED ORDER — EPOETIN ALFA-EPBX 20000 UNIT/ML IJ SOLN
50000.0000 [IU] | Freq: Once | INTRAMUSCULAR | Status: DC
  Filled 2020-06-21: qty 2.5

## 2020-06-21 MED ORDER — EPOETIN ALFA-EPBX 10000 UNIT/ML IJ SOLN
10000.0000 [IU] | Freq: Once | INTRAMUSCULAR | Status: AC
  Administered 2020-06-21: 10000 [IU] via SUBCUTANEOUS

## 2020-06-21 NOTE — Patient Instructions (Signed)

## 2020-06-22 ENCOUNTER — Telehealth: Payer: Self-pay | Admitting: Oncology

## 2020-06-22 NOTE — Telephone Encounter (Signed)
Scheduled appts per 9/13 staff msg. Pt confirmed appt dates and times.

## 2020-07-02 DIAGNOSIS — M810 Age-related osteoporosis without current pathological fracture: Secondary | ICD-10-CM | POA: Diagnosis not present

## 2020-07-02 DIAGNOSIS — M109 Gout, unspecified: Secondary | ICD-10-CM | POA: Diagnosis not present

## 2020-07-02 DIAGNOSIS — E11319 Type 2 diabetes mellitus with unspecified diabetic retinopathy without macular edema: Secondary | ICD-10-CM | POA: Diagnosis not present

## 2020-07-02 DIAGNOSIS — E7800 Pure hypercholesterolemia, unspecified: Secondary | ICD-10-CM | POA: Diagnosis not present

## 2020-07-07 ENCOUNTER — Other Ambulatory Visit (HOSPITAL_COMMUNITY): Payer: Self-pay | Admitting: Cardiology

## 2020-07-09 DIAGNOSIS — I2510 Atherosclerotic heart disease of native coronary artery without angina pectoris: Secondary | ICD-10-CM | POA: Diagnosis not present

## 2020-07-09 DIAGNOSIS — Z0000 Encounter for general adult medical examination without abnormal findings: Secondary | ICD-10-CM | POA: Diagnosis not present

## 2020-07-09 DIAGNOSIS — E7800 Pure hypercholesterolemia, unspecified: Secondary | ICD-10-CM | POA: Diagnosis not present

## 2020-07-09 DIAGNOSIS — E1122 Type 2 diabetes mellitus with diabetic chronic kidney disease: Secondary | ICD-10-CM | POA: Diagnosis not present

## 2020-07-09 DIAGNOSIS — E11319 Type 2 diabetes mellitus with unspecified diabetic retinopathy without macular edema: Secondary | ICD-10-CM | POA: Diagnosis not present

## 2020-07-09 DIAGNOSIS — N184 Chronic kidney disease, stage 4 (severe): Secondary | ICD-10-CM | POA: Diagnosis not present

## 2020-07-09 DIAGNOSIS — E1159 Type 2 diabetes mellitus with other circulatory complications: Secondary | ICD-10-CM | POA: Diagnosis not present

## 2020-07-09 DIAGNOSIS — I482 Chronic atrial fibrillation, unspecified: Secondary | ICD-10-CM | POA: Diagnosis not present

## 2020-07-09 DIAGNOSIS — Z23 Encounter for immunization: Secondary | ICD-10-CM | POA: Diagnosis not present

## 2020-07-09 DIAGNOSIS — I6529 Occlusion and stenosis of unspecified carotid artery: Secondary | ICD-10-CM | POA: Diagnosis not present

## 2020-07-09 DIAGNOSIS — E1151 Type 2 diabetes mellitus with diabetic peripheral angiopathy without gangrene: Secondary | ICD-10-CM | POA: Diagnosis not present

## 2020-07-09 DIAGNOSIS — R82998 Other abnormal findings in urine: Secondary | ICD-10-CM | POA: Diagnosis not present

## 2020-07-09 DIAGNOSIS — I130 Hypertensive heart and chronic kidney disease with heart failure and stage 1 through stage 4 chronic kidney disease, or unspecified chronic kidney disease: Secondary | ICD-10-CM | POA: Diagnosis not present

## 2020-07-09 DIAGNOSIS — I5032 Chronic diastolic (congestive) heart failure: Secondary | ICD-10-CM | POA: Diagnosis not present

## 2020-07-10 ENCOUNTER — Other Ambulatory Visit (HOSPITAL_COMMUNITY): Payer: Self-pay | Admitting: Cardiology

## 2020-07-19 ENCOUNTER — Other Ambulatory Visit: Payer: Self-pay

## 2020-07-19 ENCOUNTER — Ambulatory Visit: Payer: Medicare HMO

## 2020-07-19 ENCOUNTER — Inpatient Hospital Stay: Payer: Medicare HMO | Attending: Oncology

## 2020-07-19 ENCOUNTER — Inpatient Hospital Stay: Payer: Medicare HMO

## 2020-07-19 ENCOUNTER — Ambulatory Visit: Payer: Medicare HMO | Admitting: Adult Health

## 2020-07-19 ENCOUNTER — Other Ambulatory Visit: Payer: Self-pay | Admitting: Oncology

## 2020-07-19 ENCOUNTER — Other Ambulatory Visit: Payer: Medicare HMO

## 2020-07-19 VITALS — BP 149/70 | HR 51 | Temp 98.3°F

## 2020-07-19 DIAGNOSIS — D696 Thrombocytopenia, unspecified: Secondary | ICD-10-CM

## 2020-07-19 DIAGNOSIS — I739 Peripheral vascular disease, unspecified: Secondary | ICD-10-CM

## 2020-07-19 DIAGNOSIS — D508 Other iron deficiency anemias: Secondary | ICD-10-CM

## 2020-07-19 DIAGNOSIS — D500 Iron deficiency anemia secondary to blood loss (chronic): Secondary | ICD-10-CM

## 2020-07-19 DIAGNOSIS — N189 Chronic kidney disease, unspecified: Secondary | ICD-10-CM | POA: Insufficient documentation

## 2020-07-19 DIAGNOSIS — D594 Other nonautoimmune hemolytic anemias: Secondary | ICD-10-CM

## 2020-07-19 DIAGNOSIS — D631 Anemia in chronic kidney disease: Secondary | ICD-10-CM | POA: Insufficient documentation

## 2020-07-19 LAB — CBC WITH DIFFERENTIAL/PLATELET
Abs Immature Granulocytes: 0.04 10*3/uL (ref 0.00–0.07)
Basophils Absolute: 0.1 10*3/uL (ref 0.0–0.1)
Basophils Relative: 1 %
Eosinophils Absolute: 0.2 10*3/uL (ref 0.0–0.5)
Eosinophils Relative: 4 %
HCT: 29.7 % — ABNORMAL LOW (ref 36.0–46.0)
Hemoglobin: 9.7 g/dL — ABNORMAL LOW (ref 12.0–15.0)
Immature Granulocytes: 1 %
Lymphocytes Relative: 12 %
Lymphs Abs: 0.8 10*3/uL (ref 0.7–4.0)
MCH: 30.8 pg (ref 26.0–34.0)
MCHC: 32.7 g/dL (ref 30.0–36.0)
MCV: 94.3 fL (ref 80.0–100.0)
Monocytes Absolute: 0.6 10*3/uL (ref 0.1–1.0)
Monocytes Relative: 8 %
Neutro Abs: 5.2 10*3/uL (ref 1.7–7.7)
Neutrophils Relative %: 74 %
Platelets: 143 10*3/uL — ABNORMAL LOW (ref 150–400)
RBC: 3.15 MIL/uL — ABNORMAL LOW (ref 3.87–5.11)
RDW: 15.6 % — ABNORMAL HIGH (ref 11.5–15.5)
WBC: 7 10*3/uL (ref 4.0–10.5)
nRBC: 0 % (ref 0.0–0.2)

## 2020-07-19 LAB — RETICULOCYTES
Immature Retic Fract: 7.7 % (ref 2.3–15.9)
RBC.: 3.18 MIL/uL — ABNORMAL LOW (ref 3.87–5.11)
Retic Count, Absolute: 41.7 10*3/uL (ref 19.0–186.0)
Retic Ct Pct: 1.3 % (ref 0.4–3.1)

## 2020-07-19 LAB — FERRITIN: Ferritin: 284 ng/mL (ref 11–307)

## 2020-07-19 LAB — SAVE SMEAR(SSMR), FOR PROVIDER SLIDE REVIEW

## 2020-07-19 MED ORDER — EPOETIN ALFA-EPBX 40000 UNIT/ML IJ SOLN
40000.0000 [IU] | Freq: Once | INTRAMUSCULAR | Status: AC
  Administered 2020-07-19: 40000 [IU] via SUBCUTANEOUS

## 2020-07-19 MED ORDER — EPOETIN ALFA-EPBX 10000 UNIT/ML IJ SOLN
INTRAMUSCULAR | Status: AC
  Filled 2020-07-19: qty 1

## 2020-07-19 MED ORDER — EPOETIN ALFA-EPBX 40000 UNIT/ML IJ SOLN
INTRAMUSCULAR | Status: AC
  Filled 2020-07-19: qty 1

## 2020-07-19 MED ORDER — EPOETIN ALFA-EPBX 10000 UNIT/ML IJ SOLN
10000.0000 [IU] | Freq: Once | INTRAMUSCULAR | Status: AC
  Administered 2020-07-19: 10000 [IU] via SUBCUTANEOUS

## 2020-07-19 NOTE — Patient Instructions (Signed)

## 2020-07-20 ENCOUNTER — Other Ambulatory Visit: Payer: Self-pay

## 2020-07-20 NOTE — Patient Outreach (Signed)
[facility]) Care Management  07/20/2020  [name] 11-19-1940 [id number]   Telephone call to patient for disease management follow up.   No answer.  HIPAA compliant voice message left.    Plan: If no return call, RN CM will attempt patient again in the month of January.   [name], RN, MSN [place] [facility] Care Management Coordinator Direct Line [phone] Cell [phone] Toll Free: [phone]  Fax: [phone]

## 2020-07-23 ENCOUNTER — Ambulatory Visit (INDEPENDENT_AMBULATORY_CARE_PROVIDER_SITE_OTHER): Payer: Medicare HMO

## 2020-07-23 ENCOUNTER — Other Ambulatory Visit: Payer: Self-pay

## 2020-07-23 DIAGNOSIS — Z952 Presence of prosthetic heart valve: Secondary | ICD-10-CM | POA: Diagnosis not present

## 2020-07-23 DIAGNOSIS — Z7901 Long term (current) use of anticoagulants: Secondary | ICD-10-CM

## 2020-07-23 LAB — POCT INR: INR: 2.6 (ref 2.0–3.0)

## 2020-07-23 NOTE — Patient Instructions (Signed)
continue with warfarin 7.5mg  daily. INR in 6 weeks.  Call [phone] if you need to change the appointment

## 2020-07-27 ENCOUNTER — Ambulatory Visit: Payer: Self-pay

## 2020-07-30 DIAGNOSIS — Z1212 Encounter for screening for malignant neoplasm of rectum: Secondary | ICD-10-CM | POA: Diagnosis not present

## 2020-08-16 ENCOUNTER — Encounter: Payer: Self-pay | Admitting: Podiatry

## 2020-08-16 ENCOUNTER — Ambulatory Visit (INDEPENDENT_AMBULATORY_CARE_PROVIDER_SITE_OTHER): Payer: Medicare HMO | Admitting: Podiatry

## 2020-08-16 ENCOUNTER — Other Ambulatory Visit: Payer: Self-pay

## 2020-08-16 DIAGNOSIS — M79674 Pain in right toe(s): Secondary | ICD-10-CM

## 2020-08-16 DIAGNOSIS — E1151 Type 2 diabetes mellitus with diabetic peripheral angiopathy without gangrene: Secondary | ICD-10-CM

## 2020-08-16 DIAGNOSIS — M79675 Pain in left toe(s): Secondary | ICD-10-CM

## 2020-08-16 DIAGNOSIS — E119 Type 2 diabetes mellitus without complications: Secondary | ICD-10-CM

## 2020-08-16 DIAGNOSIS — B351 Tinea unguium: Secondary | ICD-10-CM | POA: Diagnosis not present

## 2020-08-16 DIAGNOSIS — L600 Ingrowing nail: Secondary | ICD-10-CM

## 2020-08-16 NOTE — Progress Notes (Signed)
[place] [facility]  [phone] [phone]    ID: [name] DOB: 22-Apr-1941  MR#: [id number]  YKZ#:[id number]  Patient Care Team: [name] Pao, [name] as PCP - General (Internal Medicine) [name] Dresser, [name] as PCP - Advanced Heart Failure (Cardiology) [name] Harp, [name] as PCP - Cardiology (Cardiology) [name], [name] Dad, [name] as Consulting Physician (Hematology and Oncology) [name] Silence, [name] as Consulting Physician (Gastroenterology) [name] Dresser, [name] as Consulting Physician (Cardiology) [name], DPM as Consulting Physician (Podiatry) [name] Pound, [name] as Consulting Physician (Rheumatology) [name] Billings, RN as [facility] [name] Parish, [name] as Consulting Physician (Nephrology) [name]:   CHIEF COMPLAINT: anemia of renal failure; vasculitis  CURRENT TREATMENT: Retacrit, rituximab   INTERVAL HISTORY: [name] returns today for follow-up and treatment of her multifactorial anemia and vasculitis.  She receives Retacrit every 4 weeks, and rituximab every 8 weeks.  She tolerates these treatments remarkably well, with no side effects from the rituximab that she is aware of.  We hope to broaden the rituximab eventually to every 12 weeks depending on her counts.  She is due for retacrit today.  Given the slight drop in her hemoglobin, we are increasing the dose from 10,000 to 20,000 units but continuing at every 4 weeks.. Lab Results  Component Value Date   FERRITIN 284 07/19/2020   FERRITIN 273 06/21/2020   FERRITIN 217 05/24/2020   FERRITIN 186 04/26/2020   FERRITIN 169 03/30/2020   Lab Results  Component Value Date   HGB 9.8 (L) 08/17/2020   HGB 9.7 (L) 07/19/2020   HGB 10.3 (L) 06/21/2020   HGB 10.5 (L) 05/24/2020   HGB 10.5 (L) 04/26/2020    REVIEW OF SYSTEMS: [name] tells me she has some low back pain, which is worse with more activity.  She sits down or takes a Tylenol and  keeps ongoing.  She has ankle swelling which bothers her.  She is using compression stockings and trying to avoid salt.  She tells me [name] told her to eat more protein and she does not know how to do that because things just do not taste very good given the fact that she can salt things.  She has had no fever no rash no bleeding.  She has mild loose bowel movements at times and she uses Pepto-Bismol for that.  Aside from that a detailed review of systems today was stable   COVID 19 VACCINATION STATUS: Has received Pfizer x2.  She has not yet had the booster     HISTORY OF CURRENT ILLNESS: From the original consult note:  [name] is an 79 y.o. female from [place], [place].  She has a past medical history significant forPAD s/p stenting; moderate to severe pulmonary HTN; afib and AVR on Coumadin; HTN; HLD; DM; and CAD s/p CABG. the patient was admitted to the hospital for anemia.  She was having increasing fatigue and shortness of breath and had routine lab work drawn which showed a hemoglobin of 6.6.  Due to her abnormal lab work, she was advised to come to the emergency room for admission.  She was also admitted in February 2020 for symptomatic anemia.  She reports she has been anemic for at least a year and has been taking oral iron.  She underwent a colonoscopy and EGD on 01/22/2018.  The colonoscopy showed diverticulosis in the sigmoid colon and descending colon, internal hemorrhoids.  The upper endoscopy was normal except for  erythematous mucosa in the antrum.  She had a repeat EGD on 11/21/2018 that showed a few minimally oozing( with scope trauma) superficial gastric ulcers with pigmented material were found in the gastric body. The largest lesion was 4 mm in largest dimension.  She underwent another upper endoscopy on 02/27/2019 which showed patchy mildly friable mucosa with contact bleeding was found in the gastric fundus, in the gastric body and in the gastric antrum.  The  patient is maintained on warfarin due to A. fib and a mechanical aortic valve.  Review of the patient's chart shows that she has been anemic dating back to at least December thousand 12 (these are the earliest labs available to me).  Her highest hemoglobin was 11.6 in December 2012.  I also note that the patient has had intermittent mild leukopenia adding back to at least April 2019.  Her lowest white blood cell count was 2.6 in April 2019.  Since admission, the patient has received 1 unit of packed red blood cells.  She is currently on heparin for her procedure with plan to bridge back to Coumadin.  Stool for occult blood was negative x1 on 02/24/2019.  Hematology was asked see the patient to make recommendations regarding her anemia.  [name] subsequent history is as detailed below.   PAST MEDICAL HISTORY: Past Medical History:  Diagnosis Date  . Aortic atherosclerosis ([facility]) 01/23/2018  . Atrial fibrillation ([facility])   . Carotid artery disease ([place] [facility])   . Cholelithiasis 01/23/2018  . Chronic anticoagulation   . Coronary artery disease    status post coronary artery bypass grafting times 07/10/2004  . Diabetes mellitus   . Hypercholesteremia   . Hypertension   . Mechanical heart valve present    H. aortic valve replacement at the time of bypass surgery October 2005  . Moderate to severe pulmonary hypertension ([facility])   . Peripheral arterial disease ([facility])    history of left common iliac artery PTA and stenting for a chronic total occlusion 08/26/01    PAST SURGICAL HISTORY: Past Surgical History:  Procedure Laterality Date  . AORTIC VALVE REPLACEMENT    . CARDIAC CATHETERIZATION  11/10/2004   40% right common illiac, 70% in stent restenosis of distal left common illiac,   . CARDIAC CATHETERIZATION  05/18/2004   LAD 50-70% midstenosis, RCA dominant w/50% stenosis, 50% Right common Illiac artery ostial stenosis, 90% in stent restenosis within midportion of left common illiac stent  .  Carotid Duplex  03/12/2012   RSA-elev. velocities suggestive of a 50-69% diameter reduction, Right&Left Bulb/Prox ICA-mild-mod.fibrous plaqueelevating Velocities abnormal study.  . CHOLECYSTECTOMY N/A 03/01/2018   Procedure: LAPAROSCOPIC CHOLECYSTECTOMY;  Surgeon: Judeth Horn, [name];  Location: [facility];  Service: General;  Laterality: N/A;  . COLONOSCOPY WITH PROPOFOL N/A 01/22/2018   Procedure: COLONOSCOPY WITH PROPOFOL;  Surgeon: Wilford Corner, [name];  Location: [facility];  Service: Endoscopy;  Laterality: N/A;  . ESOPHAGOGASTRODUODENOSCOPY (EGD) WITH PROPOFOL N/A 01/22/2018   Procedure: ESOPHAGOGASTRODUODENOSCOPY (EGD) WITH PROPOFOL;  Surgeon: Wilford Corner, [name];  Location: [facility];  Service: Endoscopy;  Laterality: N/A;  . ESOPHAGOGASTRODUODENOSCOPY (EGD) WITH PROPOFOL N/A 11/21/2018   Procedure: ESOPHAGOGASTRODUODENOSCOPY (EGD) WITH PROPOFOL;  Surgeon: [name], [name];  Location: [facility];  Service: Gastroenterology;  Laterality: N/A;  . ESOPHAGOGASTRODUODENOSCOPY (EGD) WITH PROPOFOL Left 02/27/2019   Procedure: ESOPHAGOGASTRODUODENOSCOPY (EGD) WITH PROPOFOL;  Surgeon: [name] Silence, [name];  Location: [facility] ENDOSCOPY;  Service: Endoscopy;  Laterality: Left;  . GIVENS CAPSULE STUDY N/A 11/21/2018   Procedure: GIVENS CAPSULE STUDY;  Surgeon: [name], [name];  Location: [facility];  Service: Gastroenterology;  Laterality: N/A;  To be deployed during EGD  . Lower Ext. Duplex  03/12/2012   Right Proximal CIA- vessel narrowing w/elevated velocities 0-49% diameter reduction. Right SFA-mild mixed density plaque throughout vessel.  [name] NM MYOCAR PERF WALL MOTION  05/19/2010   protocol: Persantine, post stress EF 65%, negative for ischemia, low risk scan  . RIGHT HEART CATH N/A 06/27/2018   Procedure: RIGHT HEART CATH;  Surgeon: [name] Dresser, [name];  Location: [facility];  Service: Cardiovascular;  Laterality: N/A;  . TRANSTHORACIC ECHOCARDIOGRAM  08/29/2012   Moderately calcified annulus  of mitral valve, moderate regurg. of both mitral valve and tricuspid valve.     FAMILY HISTORY: Family History  Problem Relation Age of Onset  . Breast cancer Neg Hx     GYNECOLOGIC HISTORY:  No LMP recorded. Patient has had a hysterectomy. Menarche:  years old Age at first live birth:  [place] P: 3 LMP:  Contraceptive:  HRT:   Hysterectomy?: yes BSO?:    SOCIAL HISTORY: (Current as of 03/07/2019) Cierria is widowed.  She lives by herself.  She has 3 children that live locally.  She smoked three quarters of a pack of cigarettes daily for 50 years.  She quit in 2005.  Denies alcohol use.   ADVANCED DIRECTIVES: Not in place    HEALTH MAINTENANCE: Social History   Tobacco Use  . Smoking status: Former Smoker    Packs/day: 1.00    Years: 30.00    Pack years: 30.00    Types: Cigarettes  . Smokeless tobacco: Never Used  . Tobacco comment: quit smoking 2005  Vaping Use  . Vaping Use: Never used  Substance Use Topics  . Alcohol use: No    Alcohol/week: 0.0 standard drinks  . Drug use: No    Colonoscopy: EGD on 02/27/2019 under Dr. Paulita Fujita found some friable gastric mucosa.  Colonoscopy under Dr. Michail Sermon 01/22/2018 showed significant diverticular disease  PAP:   Bone density: May 2017 showed osteopenia Mammography: 02/25/2017, breast density category B.  Allergies  Allergen Reactions  . Flagyl [Metronidazole] Rash    ALL-OVER BODY RASH  . Coreg [Carvedilol] [name] (See Comments)    Terrible cramping in the feet and had a lot of bowel movements, but not diarrhea  . Losartan Swelling    Patient doesn't recall site of swelling  . Verapamil Hives  . Zetia [Ezetimibe] [name] (See Comments)    Reaction not recalled  . Zocor [Simvastatin - High Dose] [name] (See Comments)    Reaction not recalled  . Gatifloxacin Rash    Redness to skin around eye    Current Outpatient Medications  Medication Sig Dispense Refill  . acetaminophen (TYLENOL) 325 MG tablet Take 650 mg by mouth  every 6 (six) hours as needed for mild pain.     [name] allopurinol (ZYLOPRIM) 100 MG tablet Take 100 mg by mouth daily.    [name] ALPRAZolam (XANAX) 0.5 MG tablet Take 0.25 mg by mouth at bedtime.     [name] amLODipine (NORVASC) 10 MG tablet Take 1 tablet (10 mg total) by mouth daily. 90 tablet 3  . atorvastatin (LIPITOR) 80 MG tablet Take 1 tablet (80 mg total) by mouth daily at 6 PM.    . BESIVANCE 0.6 % SUSP Place 1 drop into the right eye 3 (three) times daily.    . Cholecalciferol (VITAMIN D3) 1000 UNITS CAPS Take 2,000 Units by mouth daily with  lunch.     . cloNIDine (CATAPRES) 0.2 MG tablet TAKE 1 TABLET TWICE DAILY 180 tablet 3  . colchicine 0.6 MG tablet     . Cyanocobalamin (VITAMIN B12) 1000 MCG TBCR Take 1,000 mcg by mouth daily with lunch.     . DUREZOL 0.05 % EMUL Place 1 drop into the right eye 3 (three) times daily.    . furosemide (LASIX) 40 MG tablet TAKE 2 TABLETS (80 MG TOTAL)  EVERY MORNING AND 1 TABLET (40 MG TOTAL) EVERY EVENING. 270 tablet 1  . furosemide (LASIX) 80 MG tablet Take 1 tablet (80 mg total) by mouth daily. 90 tablet 3  . gatifloxacin (ZYMAXID) 0.5 % SOLN Place 1 drop into the right eye 4 (four) times daily.     . hydrALAZINE (APRESOLINE) 100 MG tablet TAKE 1 TABLET THREE TIMES DAILY 270 tablet 3  . ketorolac (ACULAR) 0.5 % ophthalmic solution Place 1 drop into the right eye 4 (four) times daily.    [name] lactose free nutrition (BOOST) LIQD Take 237 mLs by mouth daily. (To supplement meals)     . magnesium oxide (MAG-OX) 400 (241.3 Mg) MG tablet Take 1 tablet (400 mg total) by mouth daily.    . metFORMIN (GLUCOPHAGE) 500 MG tablet Take 500 mg by mouth 2 (two) times daily with a meal.     . metoprolol succinate (TOPROL XL) 25 MG 24 hr tablet Take 0.5 tablets (12.5 mg total) by mouth daily. 45 tablet 3  . pantoprazole (PROTONIX) 40 MG tablet Take 40 mg by mouth 2 (two) times daily.     [name] PROLENSA 0.07 % SOLN SMARTSIG:1 Drop(s) Right Eye Every Evening    . spironolactone  (ALDACTONE) 25 MG tablet TAKE 1 TABLET EVERY DAY 90 tablet 3  . tadalafil, PAH, (ADCIRCA) 20 MG tablet Take 2 tablets (40 mg total) by mouth daily. 60 tablet 11  . tobramycin (TOBREX) 0.3 % ophthalmic solution     . vitamin C (ASCORBIC ACID) 500 MG tablet Take 500 mg by mouth daily with lunch.    . warfarin (COUMADIN) 5 MG tablet TAKE 1.5 TO 2 TABLETS BY MOUTH DAILY AS DIRECTED BY COUMADIN CLINIC 90 tablet 1   No current facility-administered medications for this visit.    OBJECTIVE: White woman using a cane Vitals:   08/17/20 0934  BP: (!) 155/36  Pulse: (!) 55  Resp: 18  Temp: 97.9 F (36.6 C)  SpO2: 97%   Wt Readings from Last 3 Encounters:  08/17/20 129 lb 6.4 oz (58.7 kg)  06/21/20 128 lb (58.1 kg)  05/28/20 129 lb (58.5 kg)   Body mass index is 25.27 kg/m.    ECOG FS:2 - Symptomatic, <50% confined to bed  Sclerae unicteric, EOMs intact Wearing a mask No cervical or supraclavicular adenopathy Lungs no rales or rhonchi Heart regular rate and rhythm Abd soft, nontender, positive bowel sounds MSK no focal spinal tenderness to moderate palpation, bilateral ankle edema, grade 1 Neuro: nonfocal, well oriented, appropriate affect Breasts: Deferred   LAB RESULTS:  CMP     Component Value Date/Time   NA 133 (L) 03/30/2020 0932   NA 137 12/06/2017 1436   K 4.4 03/30/2020 0932   CL 103 03/30/2020 0932   CO2 20 (L) 03/30/2020 0932   GLUCOSE 152 (H) 03/30/2020 0932   BUN 57 (H) 03/30/2020 0932   BUN 20 12/06/2017 1436   CREATININE 1.73 (H) 03/30/2020 0932   CALCIUM 9.3 03/30/2020 0932   PROT 6.3 (L)  03/30/2020 0932   ALBUMIN 3.6 03/30/2020 0932   AST 21 03/30/2020 0932   ALT 16 03/30/2020 0932   ALKPHOS 83 03/30/2020 0932   BILITOT 0.4 03/30/2020 0932   GFRNONAA 27 (L) 03/30/2020 0932   GFRAA 32 (L) 03/30/2020 0932    Lab Results  Component Value Date   TOTALPROTELP 6.9 05/07/2019    No results found for: KPAFRELGTCHN, LAMBDASER, KAPLAMBRATIO  Lab  Results  Component Value Date   WBC 7.0 08/17/2020   NEUTROABS 5.1 08/17/2020   HGB 9.8 (L) 08/17/2020   HCT 29.2 (L) 08/17/2020   MCV 93.3 08/17/2020   PLT 150 08/17/2020    No results found for: LABCA2  No components found for: ZOXWRU045  No results for input(s): INR in the last 168 hours.  No results found for: LABCA2  No results found for: WUJ811  No results found for: BJY782  No results found for: NFA213  No results found for: CA2729  No components found for: HGQUANT  No results found for: CEA1 / No results found for: CEA1   No results found for: AFPTUMOR  No results found for: CHROMOGRNA  No results found for: HGBA, HGBA2QUANT, HGBFQUANT, HGBSQUAN (Hemoglobinopathy evaluation)   Lab Results  Component Value Date   LDH 212 (H) 08/04/2019    Lab Results  Component Value Date   IRON 123 03/30/2020   TIBC 268 03/30/2020   IRONPCTSAT 46 03/30/2020   (Iron and TIBC)  Lab Results  Component Value Date   FERRITIN 284 07/19/2020    Urinalysis    Component Value Date/Time   COLORURINE YELLOW 07/07/2019 0209   APPEARANCEUR CLOUDY (A) 07/07/2019 0209   LABSPEC 1.011 07/07/2019 0209   PHURINE 6.0 07/07/2019 0209   GLUCOSEU NEGATIVE 07/07/2019 0209   HGBUR NEGATIVE 07/07/2019 0209   BILIRUBINUR NEGATIVE 07/07/2019 0209   KETONESUR NEGATIVE 07/07/2019 0209   PROTEINUR >=300 (A) 07/07/2019 0209   UROBILINOGEN 1.0 10/06/2011 1709   NITRITE NEGATIVE 07/07/2019 0209   LEUKOCYTESUR LARGE (A) 07/07/2019 0209    STUDIES:  No results found.   ELIGIBLE FOR AVAILABLE RESEARCH PROTOCOL: no   ASSESSMENT: 79 y.o. [place] woman with multifactorial anemia, as follows:             (a) anemia of chronic inflammation: As of May 2020 she has a SED rate of 95, CRP 1.1, positive ANA and RF; subsequently she was found to be ANCA positive             (b) hemolysis (mild): she has a mildly elevated LDH and a DAT positive for complement, not IgG; this is c/w (a)  above             (c) anemia of renal insufficiency: inadequate EPO resonse to anemia              (d) GIB/ iron deficiency in patient on lifelong anticoagulaton  (1) Feraheme received 03/01/2019, repeated 03/20/2019   (a) reticulocyte 03/07/2019 up from 43.4 a year ago to 191.8 after iron infusion  (b) feraheme repeated on 05/12/2019  (c) subsequent ferritin levels >100  (2) darbepoetin: starting 05/05/2019 at 137mcg given every 2 weeks  (a) dose increased to 200 mcg every week starting 07/01/2019   (b) back to every 2 weeks beginning 08/04/2019 still at 200 mcg dose  (3) ANCA positive vasculitis: diagnosed 05/08/2019 (titer 1:640)  (a) prednisone 40 mg daily started 05/31/2019  (b) rituximab weekly started 06/12/2019 (received test dose 06/05/2019)   (i) hepatitis B  sAg, core Ab and HIV negative 05/08/2019   (ii) rituximab held after 07/14/2019 dose (received 5 weekly doses) with neutropenia   (iii) rituximab resumed 08/18/2019 but changed to monthly   (iv) rituximab changed to every 2 months beginning with 02/02/2020 dose  (c) prednisone tapered to off as of 09/15/2019  (4) osteopenia with a T score of -1.5 on bone density 02/28/2016 at the breast center    PLAN: Raigen continues to tolerate her therapy well and is very stable in her counts.  We are upping the Retacrit to 20,000 in an attempt to keep the hemoglobin between 10 and 11.  She will receive this every 4 weeks.  She is going to have rituximab today and in 8 weeks.  That will be the first week in January.  I will see her at that time and assuming the hemoglobin has corrected I will start the right toxin Mab every 12 weeks from that point  She knows to call for any [name] issue that may develop before the next visit  Total encounter time 25 minutes.Sarajane Jews C. Kandra Graven, [name] 08/17/20 9:51 AM Medical Oncology and Hematology [facility] [place], [place] [address] Tel. [phone]    Fax.  [phone]   I, Wilburn Mylar, am acting as scribe for Dr. Virgie [name]. Tanise Russman.  I, Lurline Del [name], have reviewed the above documentation for accuracy and completeness, and I agree with the above.   *Total Encounter Time as defined by the Centers for Medicare and Medicaid Services includes, in addition to the face-to-face time of a patient visit (documented in the note above) non-face-to-face time: obtaining and reviewing outside history, ordering and reviewing medications, tests or procedures, care coordination (communications with [name] health care professionals or caregivers) and documentation in the medical record.

## 2020-08-17 ENCOUNTER — Inpatient Hospital Stay: Payer: Medicare HMO | Attending: Oncology

## 2020-08-17 ENCOUNTER — Inpatient Hospital Stay (HOSPITAL_BASED_OUTPATIENT_CLINIC_OR_DEPARTMENT_OTHER): Payer: Medicare HMO | Admitting: Oncology

## 2020-08-17 ENCOUNTER — Other Ambulatory Visit: Payer: Self-pay

## 2020-08-17 ENCOUNTER — Inpatient Hospital Stay: Payer: Medicare HMO

## 2020-08-17 VITALS — BP 155/36 | HR 55 | Temp 97.9°F | Resp 18 | Ht 60.0 in | Wt 129.4 lb

## 2020-08-17 VITALS — BP 158/50 | HR 54 | Temp 98.5°F | Resp 20

## 2020-08-17 DIAGNOSIS — D696 Thrombocytopenia, unspecified: Secondary | ICD-10-CM | POA: Diagnosis not present

## 2020-08-17 DIAGNOSIS — Z952 Presence of prosthetic heart valve: Secondary | ICD-10-CM | POA: Diagnosis not present

## 2020-08-17 DIAGNOSIS — N189 Chronic kidney disease, unspecified: Secondary | ICD-10-CM | POA: Insufficient documentation

## 2020-08-17 DIAGNOSIS — D61818 Other pancytopenia: Secondary | ICD-10-CM

## 2020-08-17 DIAGNOSIS — Z5112 Encounter for antineoplastic immunotherapy: Secondary | ICD-10-CM | POA: Insufficient documentation

## 2020-08-17 DIAGNOSIS — M8580 Other specified disorders of bone density and structure, unspecified site: Secondary | ICD-10-CM | POA: Diagnosis not present

## 2020-08-17 DIAGNOSIS — D500 Iron deficiency anemia secondary to blood loss (chronic): Secondary | ICD-10-CM

## 2020-08-17 DIAGNOSIS — D631 Anemia in chronic kidney disease: Secondary | ICD-10-CM | POA: Insufficient documentation

## 2020-08-17 DIAGNOSIS — N289 Disorder of kidney and ureter, unspecified: Secondary | ICD-10-CM | POA: Diagnosis not present

## 2020-08-17 DIAGNOSIS — D508 Other iron deficiency anemias: Secondary | ICD-10-CM

## 2020-08-17 DIAGNOSIS — I776 Arteritis, unspecified: Secondary | ICD-10-CM | POA: Diagnosis not present

## 2020-08-17 DIAGNOSIS — I739 Peripheral vascular disease, unspecified: Secondary | ICD-10-CM

## 2020-08-17 DIAGNOSIS — D594 Other nonautoimmune hemolytic anemias: Secondary | ICD-10-CM

## 2020-08-17 DIAGNOSIS — I7782 Antineutrophilic cytoplasmic antibody (ANCA) vasculitis: Secondary | ICD-10-CM

## 2020-08-17 LAB — FERRITIN: Ferritin: 251 ng/mL (ref 11–307)

## 2020-08-17 LAB — CBC WITH DIFFERENTIAL/PLATELET
Abs Immature Granulocytes: 0.04 10*3/uL (ref 0.00–0.07)
Basophils Absolute: 0.1 10*3/uL (ref 0.0–0.1)
Basophils Relative: 1 %
Eosinophils Absolute: 0.2 10*3/uL (ref 0.0–0.5)
Eosinophils Relative: 3 %
HCT: 29.2 % — ABNORMAL LOW (ref 36.0–46.0)
Hemoglobin: 9.8 g/dL — ABNORMAL LOW (ref 12.0–15.0)
Immature Granulocytes: 1 %
Lymphocytes Relative: 14 %
Lymphs Abs: 1 10*3/uL (ref 0.7–4.0)
MCH: 31.3 pg (ref 26.0–34.0)
MCHC: 33.6 g/dL (ref 30.0–36.0)
MCV: 93.3 fL (ref 80.0–100.0)
Monocytes Absolute: 0.6 10*3/uL (ref 0.1–1.0)
Monocytes Relative: 8 %
Neutro Abs: 5.1 10*3/uL (ref 1.7–7.7)
Neutrophils Relative %: 73 %
Platelets: 150 10*3/uL (ref 150–400)
RBC: 3.13 MIL/uL — ABNORMAL LOW (ref 3.87–5.11)
RDW: 15.4 % (ref 11.5–15.5)
WBC: 7 10*3/uL (ref 4.0–10.5)
nRBC: 0 % (ref 0.0–0.2)

## 2020-08-17 LAB — RETICULOCYTES
Immature Retic Fract: 4.1 % (ref 2.3–15.9)
RBC.: 3.15 MIL/uL — ABNORMAL LOW (ref 3.87–5.11)
Retic Count, Absolute: 43.8 10*3/uL (ref 19.0–186.0)
Retic Ct Pct: 1.4 % (ref 0.4–3.1)

## 2020-08-17 LAB — SAVE SMEAR(SSMR), FOR PROVIDER SLIDE REVIEW

## 2020-08-17 MED ORDER — ACETAMINOPHEN 325 MG PO TABS
650.0000 mg | ORAL_TABLET | Freq: Once | ORAL | Status: AC
  Administered 2020-08-17: 650 mg via ORAL

## 2020-08-17 MED ORDER — EPOETIN ALFA-EPBX 40000 UNIT/ML IJ SOLN
INTRAMUSCULAR | Status: AC
  Filled 2020-08-17: qty 1

## 2020-08-17 MED ORDER — SODIUM CHLORIDE 0.9 % IV SOLN
Freq: Once | INTRAVENOUS | Status: AC
  Filled 2020-08-17: qty 250

## 2020-08-17 MED ORDER — EPOETIN ALFA-EPBX 10000 UNIT/ML IJ SOLN
20000.0000 [IU] | Freq: Once | INTRAMUSCULAR | Status: AC
  Administered 2020-08-17: 20000 [IU] via SUBCUTANEOUS

## 2020-08-17 MED ORDER — DIPHENHYDRAMINE HCL 25 MG PO CAPS
ORAL_CAPSULE | ORAL | Status: AC
  Filled 2020-08-17: qty 1

## 2020-08-17 MED ORDER — EPOETIN ALFA-EPBX 40000 UNIT/ML IJ SOLN
40000.0000 [IU] | Freq: Once | INTRAMUSCULAR | Status: AC
  Administered 2020-08-17: 40000 [IU] via SUBCUTANEOUS

## 2020-08-17 MED ORDER — EPOETIN ALFA-EPBX 10000 UNIT/ML IJ SOLN
INTRAMUSCULAR | Status: AC
  Filled 2020-08-17: qty 2

## 2020-08-17 MED ORDER — EPOETIN ALFA-EPBX 40000 UNIT/ML IJ SOLN: 20000.0000 [IU] | Freq: Once | INTRAMUSCULAR | Status: DC

## 2020-08-17 MED ORDER — DIPHENHYDRAMINE HCL 25 MG PO CAPS
25.0000 mg | ORAL_CAPSULE | Freq: Once | ORAL | Status: AC
  Administered 2020-08-17: 25 mg via ORAL

## 2020-08-17 MED ORDER — ACETAMINOPHEN 325 MG PO TABS
ORAL_TABLET | ORAL | Status: AC
  Filled 2020-08-17: qty 2

## 2020-08-17 MED ORDER — SODIUM CHLORIDE 0.9 % IV SOLN
375.0000 mg/m2 | Freq: Once | INTRAVENOUS | Status: AC
  Administered 2020-08-17: 600 mg via INTRAVENOUS
  Filled 2020-08-17: qty 50

## 2020-08-17 NOTE — Progress Notes (Signed)
Pt has been receiving Retacrit 50,000units monthly.  MD increasing Retacrit to 60,000units monthly. Orders changed as requested. RN aware.  Hardie Pulley, PharmD, BCPS, BCOP

## 2020-08-17 NOTE — Patient Instructions (Signed)
[facility] Discharge Instructions for Patients Receiving Chemotherapy  Today you received the following immunotherapy agent: Rituximab/Rituxan  To help prevent nausea and vomiting after your treatment, we encourage you to take your nausea medication as directed by your MD.   If you develop nausea and vomiting that is not controlled by your nausea medication, call the clinic.   BELOW ARE SYMPTOMS THAT SHOULD BE REPORTED IMMEDIATELY:  *FEVER GREATER THAN 100.5 F  *CHILLS WITH OR WITHOUT FEVER  NAUSEA AND VOMITING THAT IS NOT CONTROLLED WITH YOUR NAUSEA MEDICATION  *UNUSUAL SHORTNESS OF BREATH  *UNUSUAL BRUISING OR BLEEDING  TENDERNESS IN MOUTH AND THROAT WITH OR WITHOUT PRESENCE OF ULCERS  *URINARY PROBLEMS  *BOWEL PROBLEMS  UNUSUAL RASH Items with * indicate a potential emergency and should be followed up as soon as possible.  Feel free to call the clinic should you have any questions or concerns. The clinic phone number is [phone].  Please show the [facility] at check-in to the Emergency Department and triage nurse.  Epoetin Alfa injection What is this medicine? EPOETIN ALFA (e POE e tin AL fa) helps your body make more red blood cells. This medicine is used to treat anemia caused by chronic kidney disease, cancer chemotherapy, or HIV-therapy. It may also be used before surgery if you have anemia. This medicine may be used for other purposes; ask your health care provider or pharmacist if you have questions. COMMON BRAND NAME(S): Epogen, Procrit, Retacrit What should I tell my health care provider before I take this medicine? They need to know if you have any of these conditions:  cancer  heart disease  high blood pressure  history of blood clots  history of stroke  low levels of folate, iron, or vitamin B12 in the blood  seizures  an unusual or allergic reaction to erythropoietin, albumin, benzyl alcohol, hamster proteins, other  medicines, foods, dyes, or preservatives  pregnant or trying to get pregnant  breast-feeding How should I use this medicine? This medicine is for injection into a vein or under the skin. It is usually given by a health care professional in a hospital or clinic setting. If you get this medicine at home, you will be taught how to prepare and give this medicine. Use exactly as directed. Take your medicine at regular intervals. Do not take your medicine more often than directed. It is important that you put your used needles and syringes in a special sharps container. Do not put them in a trash can. If you do not have a sharps container, call your pharmacist or healthcare provider to get one. A special MedGuide will be given to you by the pharmacist with each prescription and refill. Be sure to read this information carefully each time. Talk to your pediatrician regarding the use of this medicine in children. While this drug may be prescribed for selected conditions, precautions do apply. Overdosage: If you think you have taken too much of this medicine contact a poison control center or emergency room at once. NOTE: This medicine is only for you. Do not share this medicine with others. What if I miss a dose? If you miss a dose, take it as soon as you can. If it is almost time for your next dose, take only that dose. Do not take double or extra doses. What may interact with this medicine? Interactions have not been studied. This list may not describe all possible interactions. Give your health care provider a list  of all the medicines, herbs, non-prescription drugs, or dietary supplements you use. Also tell them if you smoke, drink alcohol, or use illegal drugs. Some items may interact with your medicine. What should I watch for while using this medicine? Your condition will be monitored carefully while you are receiving this medicine. You may need blood work done while you are taking this  medicine. This medicine may cause a decrease in vitamin B6. You should make sure that you get enough vitamin B6 while you are taking this medicine. Discuss the foods you eat and the vitamins you take with your health care professional. What side effects may I notice from receiving this medicine? Side effects that you should report to your doctor or health care professional as soon as possible:  allergic reactions like skin rash, itching or hives, swelling of the face, lips, or tongue  seizures  signs and symptoms of a blood clot such as breathing problems; changes in vision; chest pain; severe, sudden headache; pain, swelling, warmth in the leg; trouble speaking; sudden numbness or weakness of the face, arm or leg  signs and symptoms of a stroke like changes in vision; confusion; trouble speaking or understanding; severe headaches; sudden numbness or weakness of the face, arm or leg; trouble walking; dizziness; loss of balance or coordination Side effects that usually do not require medical attention (report to your doctor or health care professional if they continue or are bothersome):  chills  cough  dizziness  fever  headaches  joint pain  muscle cramps  muscle pain  nausea, vomiting  pain, redness, or irritation at site where injected This list may not describe all possible side effects. Call your doctor for medical advice about side effects. You may report side effects to FDA at 1-800-FDA-1088. Where should I keep my medicine? Keep out of the reach of children. Store in a refrigerator between 2 and 8 degrees C (36 and 46 degrees F). Do not freeze or shake. Throw away any unused portion if using a single-dose vial. Multi-dose vials can be kept in the refrigerator for up to 21 days after the initial dose. Throw away unused medicine. NOTE: This sheet is a summary. It may not cover all possible information. If you have questions about this medicine, talk to your doctor,  pharmacist, or health care provider.  2020 Elsevier/Gold Standard (2017-05-04 08:35:19)

## 2020-08-19 ENCOUNTER — Telehealth: Payer: Self-pay | Admitting: Oncology

## 2020-08-19 NOTE — Progress Notes (Signed)
ANNUAL DIABETIC FOOT EXAM  Subjective: [name] presents today for for annual diabetic foot examination, at risk foot care. Pt has h/o NIDDM with chronic kidney disease and painful thick toenails that are difficult to trim. Pain interferes with ambulation. Aggravating factors include wearing enclosed shoe gear. Pain is relieved with periodic professional debridement..  Patient relates 20 year h/o diabetes.  Patient denies any h/o foot wounds.  Patient denies symptoms of foot numbness.  Patient denies any symptoms of foot tingling.  Patient admits to symptoms of burning in feet. .  Patient did not check blood glucose this morning.  [name], [name], MD is patient's PCP. Last visit was September of 2021.  Past Medical History:  Diagnosis Date  . Aortic atherosclerosis ([place]) 01/23/2018  . Atrial fibrillation ([place])   . Carotid artery disease ([place])   . Cholelithiasis 01/23/2018  . Chronic anticoagulation   . Coronary artery disease    status post coronary artery bypass grafting times 07/10/2004  . Diabetes mellitus   . Hypercholesteremia   . Hypertension   . Mechanical heart valve present    H. aortic valve replacement at the time of bypass surgery October 2005  . Moderate to severe pulmonary hypertension ([place])   . Peripheral arterial disease (HCC)    history of left common iliac artery PTA and stenting for a chronic total occlusion 08/26/01   Patient Active Problem List   Diagnosis Date Noted  . CKD (chronic kidney disease), stage IV ([place]) 10/13/2019  . Leukopenia due to antineoplastic chemotherapy ([place]) 08/18/2019  . Chemotherapy induced neutropenia ([place]) 07/21/2019  . Pancytopenia ([place]) 05/28/2019  . Dyspnea 05/28/2019  . Vasculitis, ANCA positive ([place]) 05/28/2019  . Renal insufficiency 05/08/2019  . Acute on chronic diastolic CHF (congestive heart failure) ([place]) 04/07/2019  . Left leg cellulitis 04/07/2019  . PAF (paroxysmal atrial fibrillation) ([place]) 04/07/2019   . Iron deficiency anemia 02/28/2019  . 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 ([place]) 02/28/2019  . CKD (chronic kidney disease) stage 3, GFR 30-59 ml/min (HCC) 02/24/2019  . Malnutrition of moderate degree 03/12/2018  . Acute on chronic diastolic heart failure ([place]) 03/06/2018  . Diabetes mellitus type 2, controlled ([place]) 03/06/2018  . S/P cholecystectomy 03/06/2018  . HTN (hypertension) 03/06/2018  . H/O mechanical aortic valve replacement 03/06/2018  . Acute hyponatremia 03/06/2018  . Acute respiratory failure with hypoxia ([place]) 03/06/2018  . Moderate to severe pulmonary hypertension ([place]) 03/06/2018  . UTI (urinary tract infection) 02/22/2018  . Cholecystitis 02/21/2018  . GERD (gastroesophageal reflux disease) 02/21/2018  . Anxiety 02/21/2018  . Chronic diastolic CHF (congestive heart failure) ([place]) 02/21/2018  . Diarrhea 02/21/2018  . Rash 02/21/2018  . General weakness 02/02/2018  . Macular rash 01/25/2018  . Thrombocytopenia ([place]) 01/25/2018  . Arm pain, diffuse 01/25/2018  . Anemia 01/23/2018  . Cholelithiasis 01/23/2018  . Aortic atherosclerosis ([place]) 01/23/2018  . GI bleed 01/18/2018  . Coronary artery disease 08/18/2013  . Carotid artery disease ([place]) 08/18/2013  . Peripheral arterial disease ([place]) 08/18/2013  . Diabetes mellitus without complication ([place]) 44/96/7591  . Benign positional vertigo 10/06/2011  . Hyponatremia 10/06/2011  . Influenza 10/06/2011  . Sinusitis 10/06/2011  . Hypertension   . Hypercholesteremia   . Mechanical heart valve present   . Chronic anticoagulation    Past Surgical History:  Procedure Laterality Date  . AORTIC VALVE REPLACEMENT    . CARDIAC CATHETERIZATION  11/10/2004   40% right common illiac, 70% in stent restenosis of distal left common  illiac,   . CARDIAC CATHETERIZATION  05/18/2004   LAD 50-70% midstenosis, RCA dominant w/50% stenosis, 50% Right common Illiac artery ostial stenosis, 90% in stent  restenosis within midportion of left common illiac stent  . Carotid Duplex  03/12/2012   RSA-elev. velocities suggestive of a 50-69% diameter reduction, Right&Left Bulb/Prox ICA-mild-mod.fibrous plaqueelevating Velocities abnormal study.  . CHOLECYSTECTOMY N/A 03/01/2018   Procedure: LAPAROSCOPIC CHOLECYSTECTOMY;  Surgeon: Judeth Horn, MD;  Location: [facility];  Service: General;  Laterality: N/A;  . COLONOSCOPY WITH PROPOFOL N/A 01/22/2018   Procedure: COLONOSCOPY WITH PROPOFOL;  Surgeon: Wilford Corner, MD;  Location: [facility];  Service: Endoscopy;  Laterality: N/A;  . ESOPHAGOGASTRODUODENOSCOPY (EGD) WITH PROPOFOL N/A 01/22/2018   Procedure: ESOPHAGOGASTRODUODENOSCOPY (EGD) WITH PROPOFOL;  Surgeon: Wilford Corner, MD;  Location: [facility];  Service: Endoscopy;  Laterality: N/A;  . ESOPHAGOGASTRODUODENOSCOPY (EGD) WITH PROPOFOL N/A 11/21/2018   Procedure: ESOPHAGOGASTRODUODENOSCOPY (EGD) WITH PROPOFOL;  Surgeon: [name], MD;  Location: [facility];  Service: Gastroenterology;  Laterality: N/A;  . ESOPHAGOGASTRODUODENOSCOPY (EGD) WITH PROPOFOL Left 02/27/2019   Procedure: ESOPHAGOGASTRODUODENOSCOPY (EGD) WITH PROPOFOL;  Surgeon: Arta Silence, MD;  [facility] ENDOSCOPY;  Service: Endoscopy;  Laterality: Left;  . GIVENS CAPSULE STUDY N/A 11/21/2018   Procedure: GIVENS CAPSULE STUDY;  Surgeon: [name], MD;  Location: [facility];  Service: Gastroenterology;  Laterality: N/A;  To be deployed during EGD  . Lower Ext. Duplex  03/12/2012   Right Proximal CIA- vessel narrowing w/elevated velocities 0-49% diameter reduction. Right SFA-mild mixed density plaque throughout vessel.  [name] NM MYOCAR PERF WALL MOTION  05/19/2010   protocol: Persantine, post stress EF 65%, negative for ischemia, low risk scan  . RIGHT HEART CATH N/A 06/27/2018   Procedure: RIGHT HEART CATH;  Surgeon: [name], MD;  Location: [facility] CV [facility];  Service: Cardiovascular;  Laterality: N/A;  . TRANSTHORACIC  ECHOCARDIOGRAM  08/29/2012   Moderately calcified annulus of mitral valve, moderate regurg. of both mitral valve and tricuspid valve.    Current Outpatient Medications on File Prior to Visit  Medication Sig Dispense Refill  . acetaminophen (TYLENOL) 325 MG tablet Take 650 mg by mouth every 6 (six) hours as needed for mild pain.     [name] allopurinol (ZYLOPRIM) 100 MG tablet Take 100 mg by mouth daily.    [name] ALPRAZolam (XANAX) 0.5 MG tablet Take 0.25 mg by mouth at bedtime.     [name] amLODipine (NORVASC) 10 MG tablet Take 1 tablet (10 mg total) by mouth daily. 90 tablet 3  . atorvastatin (LIPITOR) 80 MG tablet Take 1 tablet (80 mg total) by mouth daily at 6 PM.    . BESIVANCE 0.6 % SUSP Place 1 drop into the right eye 3 (three) times daily.    . Cholecalciferol (VITAMIN D3) 1000 UNITS CAPS Take 2,000 Units by mouth daily with lunch.     . cloNIDine (CATAPRES) 0.2 MG tablet TAKE 1 TABLET TWICE DAILY 180 tablet 3  . colchicine 0.6 MG tablet     . Cyanocobalamin (VITAMIN B12) 1000 MCG TBCR Take 1,000 mcg by mouth daily with lunch.     . DUREZOL 0.05 % EMUL Place 1 drop into the right eye 3 (three) times daily.    . furosemide (LASIX) 40 MG tablet TAKE 2 TABLETS (80 MG TOTAL)  EVERY MORNING AND 1 TABLET (40 MG TOTAL) EVERY EVENING. 270 tablet 1  . furosemide (LASIX) 80 MG tablet Take 1 tablet (80 mg total) by mouth daily. 90 tablet 3  .  gatifloxacin (ZYMAXID) 0.5 % SOLN Place 1 drop into the right eye 4 (four) times daily.     . hydrALAZINE (APRESOLINE) 100 MG tablet TAKE 1 TABLET THREE TIMES DAILY 270 tablet 3  . ketorolac (ACULAR) 0.5 % ophthalmic solution Place 1 drop into the right eye 4 (four) times daily.    [name] lactose free nutrition (BOOST) LIQD Take 237 mLs by mouth daily. (To supplement meals)     . magnesium oxide (MAG-OX) 400 (241.3 Mg) MG tablet Take 1 tablet (400 mg total) by mouth daily.    . metFORMIN (GLUCOPHAGE) 500 MG tablet Take 500 mg by mouth 2 (two) times daily with a meal.     .  metoprolol succinate (TOPROL XL) 25 MG 24 hr tablet Take 0.5 tablets (12.5 mg total) by mouth daily. 45 tablet 3  . pantoprazole (PROTONIX) 40 MG tablet Take 40 mg by mouth 2 (two) times daily.     [name] PROLENSA 0.07 % SOLN SMARTSIG:1 Drop(s) Right Eye Every Evening    . spironolactone (ALDACTONE) 25 MG tablet TAKE 1 TABLET EVERY DAY 90 tablet 3  . tadalafil, PAH, (ADCIRCA) 20 MG tablet Take 2 tablets (40 mg total) by mouth daily. 60 tablet 11  . tobramycin (TOBREX) 0.3 % ophthalmic solution     . vitamin C (ASCORBIC ACID) 500 MG tablet Take 500 mg by mouth daily with lunch.    . warfarin (COUMADIN) 5 MG tablet TAKE 1.5 TO 2 TABLETS BY MOUTH DAILY AS DIRECTED BY COUMADIN CLINIC 90 tablet 1   No current facility-administered medications on file prior to visit.    Allergies  Allergen Reactions  . Flagyl [Metronidazole] Rash    ALL-OVER BODY RASH  . Coreg [Carvedilol] Other (See Comments)    Terrible cramping in the feet and had a lot of bowel movements, but not diarrhea  . Losartan Swelling    Patient doesn't recall site of swelling  . Verapamil Hives  . Zetia [Ezetimibe] Other (See Comments)    Reaction not recalled  . Zocor [Simvastatin - High Dose] Other (See Comments)    Reaction not recalled  . Gatifloxacin Rash    Redness to skin around eye   Social History   Occupational History  . Occupation: Retired  Tobacco Use  . Smoking status: Former Smoker    Packs/day: 1.00    Years: 30.00    Pack years: 30.00    Types: Cigarettes  . Smokeless tobacco: Never Used  . Tobacco comment: quit smoking 2005  Vaping Use  . Vaping Use: Never used  Substance and Sexual Activity  . Alcohol use: No    Alcohol/week: 0.0 standard drinks  . Drug use: No  . Sexual activity: Not Currently   Family History  Problem Relation Age of Onset  . Breast cancer Neg Hx    Immunization History  Administered Date(s) Administered  . Influenza-Unspecified 08/24/2019     Review of Systems: Negative  except as noted in the HPI.  Objective: There were no vitals filed for this visit.  [name] is a pleasant 79 y.o. female in NAD. AAO X 3.  Vascular Examination: Capillary fill time to digits <3 seconds b/l lower extremities. Faintly palpable pedal pulses b/l. Pedal hair absent. Lower extremity skin temperature gradient within normal limits. +1 pitting edema b/l lower extremities. No ischemia or gangrene noted b/l lower extremities.   Wearing open-toed compression hose.   Dermatological Examination: Pedal skin is thin shiny, atrophic b/l lower extremities. No open  wounds bilaterally. No interdigital macerations bilaterally. Toenails 1-5 b/l elongated, discolored, dystrophic, thickened, crumbly with subungual debris and tenderness to dorsal palpation. Incurvated nailplate medial and lateral border(s) L hallux and R hallux.  Nail border hypertrophy mild. There is tenderness to palpation. Sign(s) of infection: no clinical signs of infection noted on examination today..  Musculoskeletal Examination: Normal muscle strength 5/5 to all lower extremity muscle groups bilaterally. No pain crepitus or joint limitation noted with ROM b/l. No gross bony deformities bilaterally.  Footwear Assessment: Does the patient wear appropriate shoes? Yes. Does the patient need inserts/orthotics? No..  Neurological Examination: Protective sensation intact 5/5 intact bilaterally with 10g monofilament b/l. Vibratory sensation intact b/l. Proprioception intact bilaterally.   Assessment: 1. Pain due to onychomycosis of toenails of both feet   2. Ingrown toenail without infection   3. Type II diabetes mellitus with peripheral circulatory disorder (HCC)   4. Encounter for diabetic foot exam ([place])      ADA Risk Categorization: Low Risk :  Patient has all of the following: Intact protective sensation No prior foot ulcer  No severe deformity Pedal pulses present  Plan: -Examined patient. -Diabetic foot  examination performed on today's visit. -Continue diabetic foot care principles. -Patient to continue soft, supportive shoe gear daily. -Toenails 1-5 b/l were debrided in length and girth with sterile nail nippers and dremel without iatrogenic bleeding.  -Offending nail border debrided and curretaged L hallux and R hallux utilizing sterile nail nipper and currette. Border cleansed with alcohol and triple antibiotic applied. No further treatment required by patient/caregiver. -Patient to report any pedal injuries to medical professional immediately. -Patient/POA to call should there be question/concern in the interim.  Return in about 9 weeks (around 10/18/2020) for diabetic foot care.  Marzetta Board, DPM

## 2020-08-19 NOTE — Telephone Encounter (Signed)
Scheduled per 11/9 los. Pt will receive an updated appt calendar at next visit per appt notes  

## 2020-08-25 ENCOUNTER — Other Ambulatory Visit: Payer: Self-pay | Admitting: Cardiovascular Disease

## 2020-08-30 ENCOUNTER — Ambulatory Visit (INDEPENDENT_AMBULATORY_CARE_PROVIDER_SITE_OTHER): Payer: Medicare HMO

## 2020-08-30 DIAGNOSIS — Z952 Presence of prosthetic heart valve: Secondary | ICD-10-CM | POA: Diagnosis not present

## 2020-08-30 DIAGNOSIS — Z7901 Long term (current) use of anticoagulants: Secondary | ICD-10-CM | POA: Diagnosis not present

## 2020-08-30 LAB — POCT INR: INR: 2.3 (ref 2.0–3.0)

## 2020-08-30 NOTE — Patient Instructions (Signed)
continue with warfarin 7.5mg  daily. INR in 6 weeks.  Call [phone] if you need to change the appointment

## 2020-08-31 DIAGNOSIS — H6123 Impacted cerumen, bilateral: Secondary | ICD-10-CM | POA: Diagnosis not present

## 2020-08-31 DIAGNOSIS — H9193 Unspecified hearing loss, bilateral: Secondary | ICD-10-CM | POA: Insufficient documentation

## 2020-09-13 ENCOUNTER — Other Ambulatory Visit: Payer: Self-pay

## 2020-09-13 ENCOUNTER — Inpatient Hospital Stay: Payer: Medicare HMO | Attending: Oncology

## 2020-09-13 ENCOUNTER — Inpatient Hospital Stay: Payer: Medicare HMO

## 2020-09-13 VITALS — BP 133/90 | HR 57 | Temp 97.7°F | Resp 18

## 2020-09-13 DIAGNOSIS — D631 Anemia in chronic kidney disease: Secondary | ICD-10-CM | POA: Diagnosis not present

## 2020-09-13 DIAGNOSIS — D508 Other iron deficiency anemias: Secondary | ICD-10-CM

## 2020-09-13 DIAGNOSIS — N184 Chronic kidney disease, stage 4 (severe): Secondary | ICD-10-CM | POA: Diagnosis not present

## 2020-09-13 DIAGNOSIS — D594 Other nonautoimmune hemolytic anemias: Secondary | ICD-10-CM

## 2020-09-13 DIAGNOSIS — D696 Thrombocytopenia, unspecified: Secondary | ICD-10-CM

## 2020-09-13 DIAGNOSIS — D500 Iron deficiency anemia secondary to blood loss (chronic): Secondary | ICD-10-CM

## 2020-09-13 DIAGNOSIS — I739 Peripheral vascular disease, unspecified: Secondary | ICD-10-CM

## 2020-09-13 LAB — CBC WITH DIFFERENTIAL/PLATELET
Abs Immature Granulocytes: 0.05 10*3/uL (ref 0.00–0.07)
Basophils Absolute: 0.1 10*3/uL (ref 0.0–0.1)
Basophils Relative: 1 %
Eosinophils Absolute: 0.2 10*3/uL (ref 0.0–0.5)
Eosinophils Relative: 4 %
HCT: 30.4 % — ABNORMAL LOW (ref 36.0–46.0)
Hemoglobin: 10.1 g/dL — ABNORMAL LOW (ref 12.0–15.0)
Immature Granulocytes: 1 %
Lymphocytes Relative: 17 %
Lymphs Abs: 1 10*3/uL (ref 0.7–4.0)
MCH: 31.5 pg (ref 26.0–34.0)
MCHC: 33.2 g/dL (ref 30.0–36.0)
MCV: 94.7 fL (ref 80.0–100.0)
Monocytes Absolute: 0.5 10*3/uL (ref 0.1–1.0)
Monocytes Relative: 8 %
Neutro Abs: 4.1 10*3/uL (ref 1.7–7.7)
Neutrophils Relative %: 69 %
Platelets: 171 10*3/uL (ref 150–400)
RBC: 3.21 MIL/uL — ABNORMAL LOW (ref 3.87–5.11)
RDW: 15.2 % (ref 11.5–15.5)
WBC: 5.9 10*3/uL (ref 4.0–10.5)
nRBC: 0 % (ref 0.0–0.2)

## 2020-09-13 LAB — RETICULOCYTES
Immature Retic Fract: 7.3 % (ref 2.3–15.9)
RBC.: 3.19 MIL/uL — ABNORMAL LOW (ref 3.87–5.11)
Retic Count, Absolute: 38 10*3/uL (ref 19.0–186.0)
Retic Ct Pct: 1.2 % (ref 0.4–3.1)

## 2020-09-13 LAB — SAVE SMEAR(SSMR), FOR PROVIDER SLIDE REVIEW

## 2020-09-13 LAB — FERRITIN: Ferritin: 233 ng/mL (ref 11–307)

## 2020-09-13 MED ORDER — EPOETIN ALFA-EPBX 10000 UNIT/ML IJ SOLN
20000.0000 [IU] | Freq: Once | INTRAMUSCULAR | Status: AC
  Administered 2020-09-13: 20000 [IU] via SUBCUTANEOUS

## 2020-09-13 MED ORDER — EPOETIN ALFA-EPBX 40000 UNIT/ML IJ SOLN
40000.0000 [IU] | Freq: Once | INTRAMUSCULAR | Status: AC
  Administered 2020-09-13: 40000 [IU] via SUBCUTANEOUS

## 2020-09-13 MED ORDER — EPOETIN ALFA-EPBX 40000 UNIT/ML IJ SOLN
INTRAMUSCULAR | Status: AC
  Filled 2020-09-13: qty 1

## 2020-09-13 MED ORDER — EPOETIN ALFA-EPBX 10000 UNIT/ML IJ SOLN
INTRAMUSCULAR | Status: AC
  Filled 2020-09-13: qty 2

## 2020-09-13 NOTE — Patient Instructions (Signed)

## 2020-09-14 ENCOUNTER — Inpatient Hospital Stay: Payer: Medicare HMO

## 2020-09-21 DIAGNOSIS — H524 Presbyopia: Secondary | ICD-10-CM | POA: Diagnosis not present

## 2020-09-21 DIAGNOSIS — H52223 Regular astigmatism, bilateral: Secondary | ICD-10-CM | POA: Diagnosis not present

## 2020-09-29 DIAGNOSIS — H903 Sensorineural hearing loss, bilateral: Secondary | ICD-10-CM | POA: Diagnosis not present

## 2020-10-11 ENCOUNTER — Other Ambulatory Visit: Payer: Medicare HMO

## 2020-10-11 ENCOUNTER — Ambulatory Visit: Payer: Medicare HMO

## 2020-10-11 ENCOUNTER — Other Ambulatory Visit: Payer: Self-pay

## 2020-10-11 ENCOUNTER — Ambulatory Visit (INDEPENDENT_AMBULATORY_CARE_PROVIDER_SITE_OTHER): Payer: Medicare HMO

## 2020-10-11 DIAGNOSIS — Z7901 Long term (current) use of anticoagulants: Secondary | ICD-10-CM | POA: Diagnosis not present

## 2020-10-11 DIAGNOSIS — Z952 Presence of prosthetic heart valve: Secondary | ICD-10-CM

## 2020-10-11 LAB — POCT INR: INR: 2.5 (ref 2.0–3.0)

## 2020-10-11 NOTE — Patient Instructions (Signed)
continue with warfarin 7.5mg  daily. INR in 6 weeks.  Call [phone] if you need to change the appointment.  Patients requesting [address]

## 2020-10-12 ENCOUNTER — Inpatient Hospital Stay: Payer: Medicare HMO

## 2020-10-12 ENCOUNTER — Inpatient Hospital Stay (HOSPITAL_BASED_OUTPATIENT_CLINIC_OR_DEPARTMENT_OTHER): Payer: Medicare HMO | Admitting: Oncology

## 2020-10-12 ENCOUNTER — Other Ambulatory Visit: Payer: Self-pay

## 2020-10-12 ENCOUNTER — Inpatient Hospital Stay: Payer: Medicare HMO | Attending: Oncology

## 2020-10-12 VITALS — BP 132/53 | HR 47 | Temp 97.9°F | Resp 18

## 2020-10-12 VITALS — BP 146/40 | HR 64 | Temp 97.0°F | Resp 18 | Ht 60.0 in | Wt 125.9 lb

## 2020-10-12 DIAGNOSIS — I7782 Antineutrophilic cytoplasmic antibody (ANCA) vasculitis: Secondary | ICD-10-CM

## 2020-10-12 DIAGNOSIS — I776 Arteritis, unspecified: Secondary | ICD-10-CM

## 2020-10-12 DIAGNOSIS — D61818 Other pancytopenia: Secondary | ICD-10-CM | POA: Diagnosis not present

## 2020-10-12 DIAGNOSIS — D696 Thrombocytopenia, unspecified: Secondary | ICD-10-CM

## 2020-10-12 DIAGNOSIS — I739 Peripheral vascular disease, unspecified: Secondary | ICD-10-CM

## 2020-10-12 DIAGNOSIS — N189 Chronic kidney disease, unspecified: Secondary | ICD-10-CM | POA: Insufficient documentation

## 2020-10-12 DIAGNOSIS — Z952 Presence of prosthetic heart valve: Secondary | ICD-10-CM

## 2020-10-12 DIAGNOSIS — D631 Anemia in chronic kidney disease: Secondary | ICD-10-CM | POA: Diagnosis not present

## 2020-10-12 DIAGNOSIS — N184 Chronic kidney disease, stage 4 (severe): Secondary | ICD-10-CM

## 2020-10-12 DIAGNOSIS — I4891 Unspecified atrial fibrillation: Secondary | ICD-10-CM | POA: Insufficient documentation

## 2020-10-12 DIAGNOSIS — D594 Other nonautoimmune hemolytic anemias: Secondary | ICD-10-CM

## 2020-10-12 DIAGNOSIS — D500 Iron deficiency anemia secondary to blood loss (chronic): Secondary | ICD-10-CM

## 2020-10-12 DIAGNOSIS — M8580 Other specified disorders of bone density and structure, unspecified site: Secondary | ICD-10-CM | POA: Insufficient documentation

## 2020-10-12 DIAGNOSIS — Z5112 Encounter for antineoplastic immunotherapy: Secondary | ICD-10-CM | POA: Diagnosis not present

## 2020-10-12 DIAGNOSIS — E1122 Type 2 diabetes mellitus with diabetic chronic kidney disease: Secondary | ICD-10-CM | POA: Diagnosis not present

## 2020-10-12 DIAGNOSIS — N289 Disorder of kidney and ureter, unspecified: Secondary | ICD-10-CM | POA: Diagnosis not present

## 2020-10-12 DIAGNOSIS — D508 Other iron deficiency anemias: Secondary | ICD-10-CM

## 2020-10-12 DIAGNOSIS — Z7901 Long term (current) use of anticoagulants: Secondary | ICD-10-CM | POA: Diagnosis not present

## 2020-10-12 LAB — CBC WITH DIFFERENTIAL/PLATELET
Abs Immature Granulocytes: 0.02 10*3/uL (ref 0.00–0.07)
Basophils Absolute: 0.1 10*3/uL (ref 0.0–0.1)
Basophils Relative: 1 %
Eosinophils Absolute: 0.3 10*3/uL (ref 0.0–0.5)
Eosinophils Relative: 5 %
HCT: 33 % — ABNORMAL LOW (ref 36.0–46.0)
Hemoglobin: 11.1 g/dL — ABNORMAL LOW (ref 12.0–15.0)
Immature Granulocytes: 0 %
Lymphocytes Relative: 19 %
Lymphs Abs: 1.1 10*3/uL (ref 0.7–4.0)
MCH: 31.3 pg (ref 26.0–34.0)
MCHC: 33.6 g/dL (ref 30.0–36.0)
MCV: 93 fL (ref 80.0–100.0)
Monocytes Absolute: 0.5 10*3/uL (ref 0.1–1.0)
Monocytes Relative: 9 %
Neutro Abs: 3.8 10*3/uL (ref 1.7–7.7)
Neutrophils Relative %: 66 %
Platelets: 148 10*3/uL — ABNORMAL LOW (ref 150–400)
RBC: 3.55 MIL/uL — ABNORMAL LOW (ref 3.87–5.11)
RDW: 14.9 % (ref 11.5–15.5)
WBC: 5.8 10*3/uL (ref 4.0–10.5)
nRBC: 0 % (ref 0.0–0.2)

## 2020-10-12 LAB — RETICULOCYTES
Immature Retic Fract: 5.2 % (ref 2.3–15.9)
RBC.: 3.57 MIL/uL — ABNORMAL LOW (ref 3.87–5.11)
Retic Count, Absolute: 43.2 10*3/uL (ref 19.0–186.0)
Retic Ct Pct: 1.2 % (ref 0.4–3.1)

## 2020-10-12 LAB — FERRITIN: Ferritin: 234 ng/mL (ref 11–307)

## 2020-10-12 LAB — SAVE SMEAR(SSMR), FOR PROVIDER SLIDE REVIEW

## 2020-10-12 MED ORDER — DIPHENHYDRAMINE HCL 25 MG PO CAPS
25.0000 mg | ORAL_CAPSULE | Freq: Once | ORAL | Status: AC
  Administered 2020-10-12: 25 mg via ORAL

## 2020-10-12 MED ORDER — ACETAMINOPHEN 325 MG PO TABS
650.0000 mg | ORAL_TABLET | Freq: Once | ORAL | Status: AC
  Administered 2020-10-12: 650 mg via ORAL

## 2020-10-12 MED ORDER — DIPHENHYDRAMINE HCL 25 MG PO CAPS
ORAL_CAPSULE | ORAL | Status: AC
  Filled 2020-10-12: qty 1

## 2020-10-12 MED ORDER — EPOETIN ALFA-EPBX 10000 UNIT/ML IJ SOLN: 20000.0000 [IU] | Freq: Once | INTRAMUSCULAR | Status: DC

## 2020-10-12 MED ORDER — EPOETIN ALFA-EPBX 40000 UNIT/ML IJ SOLN: 40000.0000 [IU] | Freq: Once | INTRAMUSCULAR | Status: DC

## 2020-10-12 MED ORDER — ACETAMINOPHEN 325 MG PO TABS
ORAL_TABLET | ORAL | Status: AC
  Filled 2020-10-12: qty 2

## 2020-10-12 MED ORDER — SODIUM CHLORIDE 0.9 % IV SOLN
375.0000 mg/m2 | Freq: Once | INTRAVENOUS | Status: AC
  Administered 2020-10-12: 600 mg via INTRAVENOUS
  Filled 2020-10-12: qty 50

## 2020-10-12 MED ORDER — SODIUM CHLORIDE 0.9 % IV SOLN
Freq: Once | INTRAVENOUS | Status: AC
  Filled 2020-10-12: qty 250

## 2020-10-12 NOTE — Progress Notes (Signed)
[facility]  [phone] [phone]    ID: [name] DOB: Jun 08, 1941  MR#: [id number]  KGU#:[id number]  Patient Care Team: [name] Pao, MD as PCP - [name] (Internal Medicine) [name] Dresser, MD as PCP - Advanced Heart Failure (Cardiology) [name] Harp, MD as PCP - Cardiology (Cardiology) [name], [name] Dad, MD as Consulting Physician (Hematology and Oncology) [name] Silence, MD as Consulting Physician (Gastroenterology) [name] Dresser, MD as Consulting Physician (Cardiology) [name], DPM as Consulting Physician (Podiatry) [name] Pound, MD as Consulting Physician (Rheumatology) [name] Billings, RN as [place] [facility] [name] Parish, MD as Consulting Physician (Nephrology) OTHER MD:   CHIEF COMPLAINT: anemia of renal failure; vasculitis  CURRENT TREATMENT: Retacrit, rituximab   INTERVAL HISTORY: [name] returns today for follow-up and treatment of her anemia of renal failure and vasculitis.  She receives Retacrit every 4 weeks, and has been receiving rituximab every 8 weeks.  She tolerates these treatments remarkably well, with no side effects from the rituximab that she is aware of.  She does have diarrhea she says today after her right toxin but then she has diarrhea fairly frequently.  She uses Pepto-Bismol to take care of that.    She was due for retacrit today.  However her hemoglobin is slightly over 11 so she will not receive a dose today.  Given the earlier drop in her hemoglobin, we increase the dose from 10,000 to 20,000 units previously.  We may be able to go to every 6 weeks at some point Lab Results  Component Value Date   FERRITIN 233 09/13/2020   FERRITIN 251 08/17/2020   FERRITIN 284 07/19/2020   FERRITIN 273 06/21/2020   FERRITIN 217 05/24/2020   Lab Results  Component Value Date   HGB 11.1 (L) 10/12/2020   HGB 10.1 (L) 09/13/2020   HGB 9.8 (L) 08/17/2020   HGB  9.7 (L) 07/19/2020   HGB 10.3 (L) 06/21/2020    REVIEW OF SYSTEMS: [name] enjoyed Christmas which she spent at her daughters.  She cooked the chicken and dumplings.  She took it easy for [name].  She tells me she feels well, has no shortness of breath chest pain staggering or falls.  Her functional status is good for her age.  A detailed review of systems was otherwise noncontributory   COVID 19 VACCINATION STATUS: Has received [employer] x2  with booster December 2021    HISTORY OF CURRENT ILLNESS: From the original consult note:  [name] is an 80 y.o. female from [place], [place].  She has a past medical history significant forPAD s/p stenting; moderate to severe pulmonary HTN; afib and AVR on Coumadin; HTN; HLD; DM; and CAD s/p CABG. the patient was admitted to the hospital for anemia.  She was having increasing fatigue and shortness of breath and had routine lab work drawn which showed a hemoglobin of 6.6.  Due to her abnormal lab work, she was advised to come to the emergency room for admission.  She was also admitted in February 2020 for symptomatic anemia.  She reports she has been anemic for at least a year and has been taking oral iron.  She underwent a colonoscopy and EGD on 01/22/2018.  The colonoscopy showed diverticulosis in the sigmoid colon and descending colon, internal hemorrhoids.  The upper endoscopy was normal except for erythematous mucosa in the antrum.  She had a repeat EGD on 11/21/2018 that showed a few minimally oozing( with scope  trauma) superficial gastric ulcers with pigmented material were found in the gastric body. The largest lesion was 4 mm in largest dimension.  She underwent another upper endoscopy on 02/27/2019 which showed patchy mildly friable mucosa with contact bleeding was found in the gastric fundus, in the gastric body and in the gastric antrum.  The patient is maintained on warfarin due to A. fib and a mechanical aortic valve.  Review of the  patient's chart shows that she has been anemic dating back to at least December thousand 12 (these are the earliest labs available to me).  Her highest hemoglobin was 11.6 in December 2012.  I also note that the patient has had intermittent mild leukopenia adding back to at least April 2019.  Her lowest white blood cell count was 2.6 in April 2019.  Since admission, the patient has received 1 unit of packed red blood cells.  She is currently on heparin for her procedure with plan to bridge back to Coumadin.  Stool for occult blood was negative x1 on 02/24/2019.  Hematology was asked see the patient to make recommendations regarding her anemia.  [name] subsequent history is as detailed below.   PAST MEDICAL HISTORY: Past Medical History:  Diagnosis Date  . Aortic atherosclerosis ([facility]) 01/23/2018  . Atrial fibrillation ([facility] [place])   . Carotid artery disease ([place])   . Cholelithiasis 01/23/2018  . Chronic anticoagulation   . Coronary artery disease    status post coronary artery bypass grafting times 07/10/2004  . Diabetes mellitus   . Hypercholesteremia   . Hypertension   . Mechanical heart valve present    H. aortic valve replacement at the time of bypass surgery October 2005  . Moderate to severe pulmonary hypertension ([facility])   . Peripheral arterial disease ([facility])    history of left common iliac artery PTA and stenting for a chronic total occlusion 08/26/01    PAST SURGICAL HISTORY: Past Surgical History:  Procedure Laterality Date  . AORTIC VALVE REPLACEMENT    . CARDIAC CATHETERIZATION  11/10/2004   40% right common illiac, 70% in stent restenosis of distal left common illiac,   . CARDIAC CATHETERIZATION  05/18/2004   LAD 50-70% midstenosis, RCA dominant w/50% stenosis, 50% Right common Illiac artery ostial stenosis, 90% in stent restenosis within midportion of left common illiac stent  . Carotid Duplex  03/12/2012   RSA-elev. velocities suggestive of a 50-69% diameter reduction,  Right&Left Bulb/Prox ICA-mild-mod.fibrous plaqueelevating Velocities abnormal study.  . CHOLECYSTECTOMY N/A 03/01/2018   Procedure: LAPAROSCOPIC CHOLECYSTECTOMY;  Surgeon: [name] Horn, MD;  Location: [facility];  Service: [name];  Laterality: N/A;  . COLONOSCOPY WITH PROPOFOL N/A 01/22/2018   Procedure: COLONOSCOPY WITH PROPOFOL;  Surgeon: Wilford Corner, MD;  Location: [facility];  Service: Endoscopy;  Laterality: N/A;  . ESOPHAGOGASTRODUODENOSCOPY (EGD) WITH PROPOFOL N/A 01/22/2018   Procedure: ESOPHAGOGASTRODUODENOSCOPY (EGD) WITH PROPOFOL;  Surgeon: Wilford Corner, MD;  Location: [facility];  Service: Endoscopy;  Laterality: N/A;  . ESOPHAGOGASTRODUODENOSCOPY (EGD) WITH PROPOFOL N/A 11/21/2018   Procedure: ESOPHAGOGASTRODUODENOSCOPY (EGD) WITH PROPOFOL;  Surgeon: [name], MD;  Location: [facility];  Service: Gastroenterology;  Laterality: N/A;  . ESOPHAGOGASTRODUODENOSCOPY (EGD) WITH PROPOFOL Left 02/27/2019   Procedure: ESOPHAGOGASTRODUODENOSCOPY (EGD) WITH PROPOFOL;  Surgeon: [name] Silence, MD;  Location: [facility];  Service: Endoscopy;  Laterality: Left;  . GIVENS CAPSULE STUDY N/A 11/21/2018   Procedure: GIVENS CAPSULE STUDY;  Surgeon: [name], MD;  Location: [facility];  Service: Gastroenterology;  Laterality: N/A;  To be deployed during EGD  .  Lower Ext. Duplex  03/12/2012   Right Proximal CIA- vessel narrowing w/elevated velocities 0-49% diameter reduction. Right SFA-mild mixed density plaque throughout vessel.  [name] NM MYOCAR PERF WALL MOTION  05/19/2010   protocol: Persantine, post stress EF 65%, negative for ischemia, low risk scan  . RIGHT HEART CATH N/A 06/27/2018   Procedure: RIGHT HEART CATH;  Surgeon: [name] Dresser, MD;  Location: [facility] CV LAB;  Service: Cardiovascular;  Laterality: N/A;  . TRANSTHORACIC ECHOCARDIOGRAM  08/29/2012   Moderately calcified annulus of mitral valve, moderate regurg. of both mitral valve and tricuspid valve.     FAMILY  HISTORY: Family History  Problem Relation Age of Onset  . Breast cancer Neg Hx     GYNECOLOGIC HISTORY:  No LMP recorded. Patient has had a hysterectomy. Menarche:  years old Age at first live birth:  [place] P: 3 LMP:  Contraceptive:  HRT:   Hysterectomy?: yes BSO?:    SOCIAL HISTORY: (Current as of 03/07/2019) Precilla is widowed.  She lives by herself.  She has 3 children that live locally.  She smoked three quarters of a pack of cigarettes daily for 50 years.  She quit in 2005.  Denies alcohol use.   ADVANCED DIRECTIVES: Not in place    HEALTH MAINTENANCE: Social History   Tobacco Use  . Smoking status: Former Smoker    Packs/day: 1.00    Years: 30.00    Pack years: 30.00    Types: Cigarettes  . Smokeless tobacco: Never Used  . Tobacco comment: quit smoking 2005  Vaping Use  . Vaping Use: Never used  Substance Use Topics  . Alcohol use: No    Alcohol/week: 0.0 standard drinks  . Drug use: No     Colonoscopy: EGD on 02/27/2019 under Dr. Paulita Fujita found some friable gastric mucosa.  Colonoscopy under Dr. Michail Sermon 01/22/2018 showed significant diverticular disease  PAP:   Bone density: May 2017 showed osteopenia   Allergies  Allergen Reactions  . Flagyl [Metronidazole] Rash    ALL-OVER BODY RASH  . Coreg [Carvedilol] Other (See Comments)    Terrible cramping in the feet and had a lot of bowel movements, but not diarrhea  . Losartan Swelling    Patient doesn't recall site of swelling  . Verapamil Hives  . Zetia [Ezetimibe] Other (See Comments)    Reaction not recalled  . Zocor [Simvastatin - High Dose] Other (See Comments)    Reaction not recalled  . Gatifloxacin Rash    Redness to skin around eye    Current Outpatient Medications  Medication Sig Dispense Refill  . acetaminophen (TYLENOL) 325 MG tablet Take 650 mg by mouth every 6 (six) hours as needed for mild pain.     [name] allopurinol (ZYLOPRIM) 100 MG tablet Take 100 mg by mouth daily.    [name] ALPRAZolam  (XANAX) 0.5 MG tablet Take 0.25 mg by mouth at bedtime.     [name] amLODipine (NORVASC) 10 MG tablet Take 1 tablet (10 mg total) by mouth daily. 90 tablet 3  . atorvastatin (LIPITOR) 80 MG tablet Take 1 tablet (80 mg total) by mouth daily at 6 PM.    . BESIVANCE 0.6 % SUSP Place 1 drop into the right eye 3 (three) times daily.    . Cholecalciferol (VITAMIN D3) 1000 UNITS CAPS Take 2,000 Units by mouth daily with lunch.     . cloNIDine (CATAPRES) 0.2 MG tablet TAKE 1 TABLET TWICE DAILY 180 tablet 3  . colchicine 0.6 MG tablet     .  Cyanocobalamin (VITAMIN B12) 1000 MCG TBCR Take 1,000 mcg by mouth daily with lunch.     . DUREZOL 0.05 % EMUL Place 1 drop into the right eye 3 (three) times daily.    . furosemide (LASIX) 40 MG tablet TAKE 2 TABLETS (80 MG TOTAL)  EVERY MORNING AND 1 TABLET (40 MG TOTAL) EVERY EVENING. 270 tablet 1  . furosemide (LASIX) 80 MG tablet Take 1 tablet (80 mg total) by mouth daily. 90 tablet 3  . gatifloxacin (ZYMAXID) 0.5 % SOLN Place 1 drop into the right eye 4 (four) times daily.     . hydrALAZINE (APRESOLINE) 100 MG tablet TAKE 1 TABLET THREE TIMES DAILY 270 tablet 3  . ketorolac (ACULAR) 0.5 % ophthalmic solution Place 1 drop into the right eye 4 (four) times daily.    [name] lactose free nutrition (BOOST) LIQD Take 237 mLs by mouth daily. (To supplement meals)     . magnesium oxide (MAG-OX) 400 (241.3 Mg) MG tablet Take 1 tablet (400 mg total) by mouth daily.    . metFORMIN (GLUCOPHAGE) 500 MG tablet Take 500 mg by mouth 2 (two) times daily with a meal.     . metoprolol succinate (TOPROL XL) 25 MG 24 hr tablet Take 0.5 tablets (12.5 mg total) by mouth daily. 45 tablet 3  . pantoprazole (PROTONIX) 40 MG tablet Take 40 mg by mouth 2 (two) times daily.     [name] PROLENSA 0.07 % SOLN SMARTSIG:1 Drop(s) Right Eye Every Evening    . spironolactone (ALDACTONE) 25 MG tablet TAKE 1 TABLET EVERY DAY 90 tablet 3  . tadalafil, PAH, (ADCIRCA) 20 MG tablet Take 2 tablets (40 mg total) by mouth  daily. 60 tablet 11  . tobramycin (TOBREX) 0.3 % ophthalmic solution     . vitamin C (ASCORBIC ACID) 500 MG tablet Take 500 mg by mouth daily with lunch.    . warfarin (COUMADIN) 5 MG tablet TAKE 1 AND 1/2 TABLETS TO 2 TABLETS DAILY AS DIRECTED BY COUMADIN CLINIC 180 tablet 1   No current facility-administered medications for this visit.    OBJECTIVE: White woman using a cane Vitals:   10/12/20 1005  BP: (!) 146/40  Pulse: 64  Resp: 18  Temp: (!) 97 F (36.1 C)  SpO2: 97%   Wt Readings from Last 3 Encounters:  10/12/20 125 lb 14.4 oz (57.1 kg)  08/17/20 129 lb 6.4 oz (58.7 kg)  06/21/20 128 lb (58.1 kg)   Body mass index is 24.59 kg/m.    ECOG FS:1 - Symptomatic but completely ambulatory  Sclerae unicteric, EOMs intact Wearing a mask No cervical or supraclavicular adenopathy Lungs no rales or rhonchi Heart regular rate and rhythm Abd soft, nontender, positive bowel sounds MSK no focal spinal tenderness, no upper extremity lymphedema Neuro: nonfocal, well oriented, appropriate affect Breasts: Deferred   LAB RESULTS:  CMP     Component Value Date/Time   NA 133 (L) 03/30/2020 0932   NA 137 12/06/2017 1436   K 4.4 03/30/2020 0932   CL 103 03/30/2020 0932   CO2 20 (L) 03/30/2020 0932   GLUCOSE 152 (H) 03/30/2020 0932   BUN 57 (H) 03/30/2020 0932   BUN 20 12/06/2017 1436   CREATININE 1.73 (H) 03/30/2020 0932   CALCIUM 9.3 03/30/2020 0932   PROT 6.3 (L) 03/30/2020 0932   ALBUMIN 3.6 03/30/2020 0932   AST 21 03/30/2020 0932   ALT 16 03/30/2020 0932   ALKPHOS 83 03/30/2020 0932   BILITOT 0.4 03/30/2020 0932  GFRNONAA 27 (L) 03/30/2020 0932   GFRAA 32 (L) 03/30/2020 0932    Lab Results  Component Value Date   TOTALPROTELP 6.9 05/07/2019    No results found for: Nils Pyle, [facility]  Lab Results  Component Value Date   WBC 5.8 10/12/2020   NEUTROABS 3.8 10/12/2020   HGB 11.1 (L) 10/12/2020   HCT 33.0 (L) 10/12/2020   MCV 93.0  10/12/2020   PLT 148 (L) 10/12/2020    No results found for: LABCA2  No components found for: [id number]  Recent Labs  Lab 10/11/20 1055  INR 2.5    No results found for: LABCA2  No results found for: PJA250  No results found for: NLZ767  No results found for: HAL937  No results found for: CA2729  No components found for: HGQUANT  No results found for: CEA1 / No results found for: CEA1   No results found for: AFPTUMOR  No results found for: CHROMOGRNA  No results found for: HGBA, HGBA2QUANT, HGBFQUANT, HGBSQUAN (Hemoglobinopathy evaluation)   Lab Results  Component Value Date   LDH 212 (H) 08/04/2019    Lab Results  Component Value Date   IRON 123 03/30/2020   TIBC 268 03/30/2020   IRONPCTSAT 46 03/30/2020   (Iron and TIBC)  Lab Results  Component Value Date   FERRITIN 233 09/13/2020    Urinalysis    Component Value Date/Time   COLORURINE YELLOW 07/07/2019 0209   APPEARANCEUR CLOUDY (A) 07/07/2019 0209   LABSPEC 1.011 07/07/2019 0209   PHURINE 6.0 07/07/2019 0209   GLUCOSEU NEGATIVE 07/07/2019 0209   HGBUR NEGATIVE 07/07/2019 0209   BILIRUBINUR NEGATIVE 07/07/2019 0209   KETONESUR NEGATIVE 07/07/2019 0209   PROTEINUR >=300 (A) 07/07/2019 0209   UROBILINOGEN 1.0 10/06/2011 1709   NITRITE NEGATIVE 07/07/2019 0209   LEUKOCYTESUR LARGE (A) 07/07/2019 0209    STUDIES:  No results found.   ELIGIBLE FOR AVAILABLE RESEARCH PROTOCOL: no   ASSESSMENT: 80 y.o. [place] woman with multifactorial anemia, as follows:             (a) anemia of chronic inflammation: As of May 2020 she has a SED rate of 95, CRP 1.1, positive ANA and RF; subsequently she was found to be ANCA positive             (b) hemolysis (mild): she has a mildly elevated LDH and a DAT positive for complement, not IgG; this is c/w (a) above             (c) anemia of renal insufficiency: inadequate EPO resonse to anemia              (d) GIB/ iron deficiency in patient on lifelong  anticoagulaton  (1) Feraheme received 03/01/2019, repeated 03/20/2019   (a) reticulocyte 03/07/2019 up from 43.4 a year ago to 191.8 after iron infusion  (b) feraheme repeated on 05/12/2019  (c) subsequent ferritin levels >100  (2) darbepoetin: starting 05/05/2019 at 154mcg given every 2 weeks  (a) dose increased to 200 mcg every week starting 07/01/2019   (b) back to every 2 weeks beginning 08/04/2019 still at 200 mcg dose  (c) switched to Retacrit every 4 weeks as of 08/18/2019  (3) ANCA positive vasculitis: diagnosed 05/08/2019 (titer 1:640)  (a) prednisone 40 mg daily started 05/31/2019  (b) rituximab weekly started 06/12/2019 (received test dose 06/05/2019)   (i) hepatitis B sAg, core Ab and HIV negative 05/08/2019   (ii) rituximab held after 07/14/2019 dose (received 5 weekly doses) with neutropenia   (  iii) rituximab resumed 08/18/2019 but changed to monthly   (iv) rituximab changed to every 2 months beginning with 02/02/2020 dose   (v) rituximab changed to every 12 weeks after the 10/12/2020 dose  (c) prednisone tapered to off as of 09/15/2019  (4) osteopenia with a T score of -1.5 on bone density 02/28/2016 at the breast center    PLAN: [name] is doing terrific with her current treatment which she tolerates very well and which is keeping her hemoglobin well above 10.  In fact she will not need Retacrit today.  We are going to continue the Retacrit every 4 weeks for now but we may be able to switch that to every 6 weeks.  At this point I think we can try to broaden the rituximab to every 12 weeks, so her next treatment will be 01/03/2021.  She will see me that same day  I am very pleased that she is doing so well.  She knows to call for any other issue that may develop before the next visit.  Total encounter time 25 minutes.  [name]. Niaomi Cartaya, MD 10/12/20 10:24 AM Medical Oncology and Hematology [facility] [address] [place], [place] [address] Tel.  [phone]    Fax. [phone]   I, Wilburn Mylar, am acting as scribe for Dr. Virgie [name]. Vito Beg.  I, Lurline Del MD, have reviewed the above documentation for accuracy and completeness, and I agree with the above.   *Total Encounter Time as defined by the Centers for Medicare and Medicaid Services includes, in addition to the face-to-face time of a patient visit (documented in the note above) non-face-to-face time: obtaining and reviewing outside history, ordering and reviewing medications, tests or procedures, care coordination (communications with other health care professionals or caregivers) and documentation in the medical record.

## 2020-10-12 NOTE — Patient Instructions (Signed)
[facility] Discharge Instructions for Patients Receiving Chemotherapy  Today you received the following immunotherapy agent: Rituximab/Rituxan  To help prevent nausea and vomiting after your treatment, we encourage you to take your nausea medication as directed by your MD.   If you develop nausea and vomiting that is not controlled by your nausea medication, call the clinic.   BELOW ARE SYMPTOMS THAT SHOULD BE REPORTED IMMEDIATELY:  *FEVER GREATER THAN 100.5 F  *CHILLS WITH OR WITHOUT FEVER  NAUSEA AND VOMITING THAT IS NOT CONTROLLED WITH YOUR NAUSEA MEDICATION  *UNUSUAL SHORTNESS OF BREATH  *UNUSUAL BRUISING OR BLEEDING  TENDERNESS IN MOUTH AND THROAT WITH OR WITHOUT PRESENCE OF ULCERS  *URINARY PROBLEMS  *BOWEL PROBLEMS  UNUSUAL RASH Items with * indicate a potential emergency and should be followed up as soon as possible.  Feel free to call the clinic should you have any questions or concerns. The clinic phone number is [phone].  Please show the [facility] at check-in to the Emergency Department and triage nurse.

## 2020-10-13 ENCOUNTER — Telehealth: Payer: Self-pay | Admitting: Oncology

## 2020-10-13 NOTE — Telephone Encounter (Signed)
Scheduled appts per 1/4 los. Pt to get updated appt calendar at next visit per appt notes.  °

## 2020-10-19 ENCOUNTER — Other Ambulatory Visit: Payer: Self-pay

## 2020-10-19 NOTE — Patient Outreach (Signed)
[facility]) Care Management  10/19/2020  LUDELLA PRANGER [birth year] [id number]   Telephone call to patient for disease management follow up.   No answer.  HIPAA compliant voice message left.    Plan: If no return call, RN CM will attempt patient again in the month of April.  [name], RN, MSN [facility] Care Management Coordinator Direct Line [phone] Cell [phone] Toll Free: [phone]  Fax: [phone]

## 2020-10-25 ENCOUNTER — Other Ambulatory Visit: Payer: Self-pay | Admitting: Adult Health

## 2020-10-25 DIAGNOSIS — D594 Other nonautoimmune hemolytic anemias: Secondary | ICD-10-CM

## 2020-10-25 NOTE — Progress Notes (Signed)
Referral for evusheld placed per Dr. Jana Hakim

## 2020-10-29 ENCOUNTER — Ambulatory Visit: Payer: Medicare HMO | Admitting: Podiatry

## 2020-11-05 DIAGNOSIS — I776 Arteritis, unspecified: Secondary | ICD-10-CM | POA: Diagnosis not present

## 2020-11-05 DIAGNOSIS — N184 Chronic kidney disease, stage 4 (severe): Secondary | ICD-10-CM | POA: Diagnosis not present

## 2020-11-05 DIAGNOSIS — D631 Anemia in chronic kidney disease: Secondary | ICD-10-CM | POA: Diagnosis not present

## 2020-11-05 DIAGNOSIS — E1122 Type 2 diabetes mellitus with diabetic chronic kidney disease: Secondary | ICD-10-CM | POA: Diagnosis not present

## 2020-11-05 DIAGNOSIS — I5032 Chronic diastolic (congestive) heart failure: Secondary | ICD-10-CM | POA: Diagnosis not present

## 2020-11-05 DIAGNOSIS — I129 Hypertensive chronic kidney disease with stage 1 through stage 4 chronic kidney disease, or unspecified chronic kidney disease: Secondary | ICD-10-CM | POA: Diagnosis not present

## 2020-11-05 DIAGNOSIS — M109 Gout, unspecified: Secondary | ICD-10-CM | POA: Diagnosis not present

## 2020-11-07 ENCOUNTER — Other Ambulatory Visit: Payer: Self-pay | Admitting: Adult Health

## 2020-11-07 DIAGNOSIS — D61818 Other pancytopenia: Secondary | ICD-10-CM

## 2020-11-07 MED ORDER — CILGAVIMAB (PART OF EVUSHELD) INJECTION: 150.0000 mg | Freq: Once | INTRAMUSCULAR | Status: DC

## 2020-11-07 MED ORDER — ALBUTEROL SULFATE HFA 108 (90 BASE) MCG/ACT IN AERS: 2.0000 | INHALATION_SPRAY | Freq: Once | RESPIRATORY_TRACT | Status: AC | PRN

## 2020-11-07 MED ORDER — EPINEPHRINE PF 1 MG/ML IJ SOLN: 0.3000 mg | Freq: Once | INTRAMUSCULAR | Status: AC | PRN

## 2020-11-07 MED ORDER — DIPHENHYDRAMINE HCL 50 MG/ML IJ SOLN: 50.0000 mg | Freq: Once | INTRAMUSCULAR | Status: AC | PRN

## 2020-11-07 MED ORDER — METHYLPREDNISOLONE SODIUM SUCC 125 MG IJ SOLR: 125.0000 mg | Freq: Once | INTRAMUSCULAR | Status: AC | PRN

## 2020-11-07 MED ORDER — TIXAGEVIMAB (PART OF EVUSHELD) INJECTION: 150.0000 mg | Freq: Once | INTRAMUSCULAR | Status: DC

## 2020-11-07 NOTE — Progress Notes (Signed)
I connected by phone with [name] on 11/07/2020, 5:42 PM to discuss the potential use of a new treatment for mild to moderate COVID-19 viral infection in non-hospitalized patients.  This patient is a 80 y.o. female that meets the FDA criteria for Emergency Use Authorization of tixagevimab/cilgavimab for pre-exposure prophylaxis of COVID-19 disease. Pt meets following criteria:  Age >12 yr and weight > 40kg  Not currently infected with SARS-CoV-2 and has no known recent exposure to an individual infected with SARS-CoV-2 AND o Who has moderate to severe immune compromise due to a medical condition or receipt of immunosuppressive medications or treatments and may not mount an adequate immune response to COVID-19 vaccination or  o Vaccination with any available COVID-19 vaccine, according to the approved or authorized schedule, is not recommended due to a history of severe adverse reaction (e.g., severe allergic reaction) to a COVID-19 vaccine(s) and/or COVID-19 vaccine component(s).  o Patient meets the following definition of mod-severe immune compromised status: 1. Received B-cell depleting therapies (e.g. rituximab, obinutuzumab, ocrelizumab, alemtuzumab) within last 6 months & age > or = 25  I have spoken and communicated the following to the patient or parent/caregiver regarding COVID monoclonal antibody treatment:  1. FDA has authorized the emergency use of tixagevimab/cilgavimab for the pre-exposure prophylaxis of COVID-19 in patients with moderate-severe immunocompromised status, who meet above EUA criteria.  2. The significant known and potential risks and benefits of COVID monoclonal antibody, and the extent to which such potential risks and benefits are unknown.  3. Information on available alternative treatments and the risks and benefits of those alternatives, including clinical trials.  4. The patient or parent/caregiver has the option to accept or refuse COVID monoclonal antibody  treatment.  After reviewing this information with the patient, agree to receive tixagevimab/cilgavimab  Scot Dock, NP, 11/07/2020, 5:42 PM

## 2020-11-08 ENCOUNTER — Inpatient Hospital Stay: Payer: Medicare HMO

## 2020-11-08 ENCOUNTER — Other Ambulatory Visit: Payer: Self-pay

## 2020-11-08 VITALS — BP 158/57 | HR 54 | Temp 97.4°F | Resp 20

## 2020-11-08 DIAGNOSIS — D631 Anemia in chronic kidney disease: Secondary | ICD-10-CM | POA: Diagnosis not present

## 2020-11-08 DIAGNOSIS — E1122 Type 2 diabetes mellitus with diabetic chronic kidney disease: Secondary | ICD-10-CM | POA: Diagnosis not present

## 2020-11-08 DIAGNOSIS — D594 Other nonautoimmune hemolytic anemias: Secondary | ICD-10-CM

## 2020-11-08 DIAGNOSIS — I776 Arteritis, unspecified: Secondary | ICD-10-CM | POA: Diagnosis not present

## 2020-11-08 DIAGNOSIS — N189 Chronic kidney disease, unspecified: Secondary | ICD-10-CM | POA: Diagnosis not present

## 2020-11-08 DIAGNOSIS — I739 Peripheral vascular disease, unspecified: Secondary | ICD-10-CM

## 2020-11-08 DIAGNOSIS — D500 Iron deficiency anemia secondary to blood loss (chronic): Secondary | ICD-10-CM

## 2020-11-08 DIAGNOSIS — Z5112 Encounter for antineoplastic immunotherapy: Secondary | ICD-10-CM | POA: Diagnosis not present

## 2020-11-08 DIAGNOSIS — D508 Other iron deficiency anemias: Secondary | ICD-10-CM

## 2020-11-08 DIAGNOSIS — Z7901 Long term (current) use of anticoagulants: Secondary | ICD-10-CM | POA: Diagnosis not present

## 2020-11-08 DIAGNOSIS — D696 Thrombocytopenia, unspecified: Secondary | ICD-10-CM

## 2020-11-08 DIAGNOSIS — I4891 Unspecified atrial fibrillation: Secondary | ICD-10-CM | POA: Diagnosis not present

## 2020-11-08 DIAGNOSIS — M8580 Other specified disorders of bone density and structure, unspecified site: Secondary | ICD-10-CM | POA: Diagnosis not present

## 2020-11-08 LAB — RETICULOCYTES
Immature Retic Fract: 9.4 % (ref 2.3–15.9)
RBC.: 2.98 MIL/uL — ABNORMAL LOW (ref 3.87–5.11)
Retic Count, Absolute: 63.8 10*3/uL (ref 19.0–186.0)
Retic Ct Pct: 2.1 % (ref 0.4–3.1)

## 2020-11-08 LAB — CBC WITH DIFFERENTIAL/PLATELET
Abs Immature Granulocytes: 0.06 10*3/uL (ref 0.00–0.07)
Basophils Absolute: 0 10*3/uL (ref 0.0–0.1)
Basophils Relative: 1 %
Eosinophils Absolute: 0.2 10*3/uL (ref 0.0–0.5)
Eosinophils Relative: 4 %
HCT: 27.6 % — ABNORMAL LOW (ref 36.0–46.0)
Hemoglobin: 9.2 g/dL — ABNORMAL LOW (ref 12.0–15.0)
Immature Granulocytes: 1 %
Lymphocytes Relative: 18 %
Lymphs Abs: 1.1 10*3/uL (ref 0.7–4.0)
MCH: 31.1 pg (ref 26.0–34.0)
MCHC: 33.3 g/dL (ref 30.0–36.0)
MCV: 93.2 fL (ref 80.0–100.0)
Monocytes Absolute: 0.5 10*3/uL (ref 0.1–1.0)
Monocytes Relative: 9 %
Neutro Abs: 4.1 10*3/uL (ref 1.7–7.7)
Neutrophils Relative %: 67 %
Platelets: 144 10*3/uL — ABNORMAL LOW (ref 150–400)
RBC: 2.96 MIL/uL — ABNORMAL LOW (ref 3.87–5.11)
RDW: 15.1 % (ref 11.5–15.5)
WBC: 6 10*3/uL (ref 4.0–10.5)
nRBC: 0 % (ref 0.0–0.2)

## 2020-11-08 LAB — SAVE SMEAR(SSMR), FOR PROVIDER SLIDE REVIEW

## 2020-11-08 LAB — FERRITIN: Ferritin: 223 ng/mL (ref 11–307)

## 2020-11-08 MED ORDER — EPOETIN ALFA-EPBX 10000 UNIT/ML IJ SOLN
20000.0000 [IU] | Freq: Once | INTRAMUSCULAR | Status: AC
  Administered 2020-11-08: 20000 [IU] via SUBCUTANEOUS

## 2020-11-08 MED ORDER — EPOETIN ALFA-EPBX 10000 UNIT/ML IJ SOLN
INTRAMUSCULAR | Status: AC
  Filled 2020-11-08: qty 2

## 2020-11-08 MED ORDER — EPOETIN ALFA-EPBX 40000 UNIT/ML IJ SOLN
INTRAMUSCULAR | Status: AC
  Filled 2020-11-08: qty 1

## 2020-11-08 MED ORDER — EPOETIN ALFA-EPBX 40000 UNIT/ML IJ SOLN
40000.0000 [IU] | Freq: Once | INTRAMUSCULAR | Status: AC
  Administered 2020-11-08: 40000 [IU] via SUBCUTANEOUS

## 2020-11-08 NOTE — Patient Instructions (Signed)

## 2020-11-09 ENCOUNTER — Other Ambulatory Visit: Payer: Medicare HMO

## 2020-11-09 ENCOUNTER — Ambulatory Visit: Payer: Medicare HMO

## 2020-11-12 ENCOUNTER — Other Ambulatory Visit: Payer: Self-pay | Admitting: Adult Health

## 2020-11-12 DIAGNOSIS — D61818 Other pancytopenia: Secondary | ICD-10-CM

## 2020-11-12 MED ORDER — ALBUTEROL SULFATE HFA 108 (90 BASE) MCG/ACT IN AERS: 2.0000 | INHALATION_SPRAY | Freq: Once | RESPIRATORY_TRACT | Status: DC | PRN

## 2020-11-12 MED ORDER — TIXAGEVIMAB (PART OF EVUSHELD) INJECTION: 150.0000 mg | Freq: Once | INTRAMUSCULAR | Status: DC

## 2020-11-12 MED ORDER — DIPHENHYDRAMINE HCL 50 MG/ML IJ SOLN: 50.0000 mg | Freq: Once | INTRAMUSCULAR | Status: DC | PRN

## 2020-11-12 MED ORDER — CILGAVIMAB (PART OF EVUSHELD) INJECTION: 150.0000 mg | Freq: Once | INTRAMUSCULAR | Status: DC

## 2020-11-12 MED ORDER — EPINEPHRINE 0.3 MG/0.3ML IJ SOAJ: 0.3000 mg | Freq: Once | INTRAMUSCULAR | Status: DC | PRN

## 2020-11-12 MED ORDER — METHYLPREDNISOLONE SODIUM SUCC 125 MG IJ SOLR: 125.0000 mg | Freq: Once | INTRAMUSCULAR | Status: DC | PRN

## 2020-11-13 ENCOUNTER — Other Ambulatory Visit: Payer: Self-pay

## 2020-11-13 ENCOUNTER — Inpatient Hospital Stay: Payer: Medicare HMO | Attending: Oncology

## 2020-11-13 VITALS — BP 147/71 | HR 45 | Temp 98.1°F | Resp 17

## 2020-11-13 DIAGNOSIS — D631 Anemia in chronic kidney disease: Secondary | ICD-10-CM | POA: Insufficient documentation

## 2020-11-13 DIAGNOSIS — I776 Arteritis, unspecified: Secondary | ICD-10-CM | POA: Insufficient documentation

## 2020-11-13 DIAGNOSIS — N189 Chronic kidney disease, unspecified: Secondary | ICD-10-CM | POA: Insufficient documentation

## 2020-11-13 MED ORDER — EPOETIN ALFA-EPBX 40000 UNIT/ML IJ SOLN: 40000.0000 [IU] | Freq: Once | INTRAMUSCULAR | Status: DC

## 2020-11-13 MED ORDER — ACETAMINOPHEN 325 MG PO TABS
650.0000 mg | ORAL_TABLET | Freq: Once | ORAL | Status: AC
  Administered 2020-11-13: 650 mg via ORAL

## 2020-11-13 MED ORDER — CILGAVIMAB (PART OF EVUSHELD) INJECTION
150.0000 mg | Freq: Once | INTRAMUSCULAR | Status: AC
  Administered 2020-11-13: 150 mg via INTRAMUSCULAR

## 2020-11-13 MED ORDER — EPOETIN ALFA-EPBX 10000 UNIT/ML IJ SOLN: 20000.0000 [IU] | Freq: Once | INTRAMUSCULAR | Status: DC

## 2020-11-13 MED ORDER — TIXAGEVIMAB (PART OF EVUSHELD) INJECTION
150.0000 mg | Freq: Once | INTRAMUSCULAR | Status: AC
  Administered 2020-11-13: 150 mg via INTRAMUSCULAR

## 2020-11-13 NOTE — Progress Notes (Signed)
1227: Pt reports a headache rated a 3 on a pain scale of 1-10, pt denies any other symptoms at this time and pt and VS stable at patient's baseline. Wilber Bihari NP aware, and per Mendel Ryder NP give 650mg  PO tylenol.   1255: Pt tolerated Evusheld injections well, no s/s of distress noted, pt denies any concerns at this time. Injection sites WNL, no bleeding, swelling, pain or redness noted. Pt educated to call clinic with any concerns, and to report to ER/Call 911 in the event of an emergency such as but not limited to chest pain or SOB. Pt verbalizes understanding. Pt and VS stable at discharge.

## 2020-11-15 ENCOUNTER — Telehealth: Payer: Self-pay | Admitting: Physician Assistant

## 2020-11-15 DIAGNOSIS — E11319 Type 2 diabetes mellitus with unspecified diabetic retinopathy without macular edema: Secondary | ICD-10-CM | POA: Diagnosis not present

## 2020-11-15 DIAGNOSIS — I272 Pulmonary hypertension, unspecified: Secondary | ICD-10-CM | POA: Diagnosis not present

## 2020-11-15 NOTE — Telephone Encounter (Signed)
Called pt about Evusheld (tixagevimab co-packaged with cilgavimab) for pre-exposure prophylaxis for prevention of coronavirus disease 2019 (COVID-19) caused by the SARS-CoV-2 virus. The patient is a candidate for this therapy given increased risk for severe disease caused by immunosuppression.    Unable to reach pt- her cell phone rings and rings and then says it's not a working number and her home phone is busy. Will try sending a mychart message.   Angelena Form PA-C  [facility]

## 2020-11-22 ENCOUNTER — Other Ambulatory Visit: Payer: Self-pay

## 2020-11-22 ENCOUNTER — Ambulatory Visit (INDEPENDENT_AMBULATORY_CARE_PROVIDER_SITE_OTHER): Payer: Medicare HMO

## 2020-11-22 DIAGNOSIS — Z7901 Long term (current) use of anticoagulants: Secondary | ICD-10-CM

## 2020-11-22 DIAGNOSIS — Z952 Presence of prosthetic heart valve: Secondary | ICD-10-CM | POA: Diagnosis not present

## 2020-11-22 DIAGNOSIS — Z5181 Encounter for therapeutic drug level monitoring: Secondary | ICD-10-CM

## 2020-11-22 LAB — POCT INR: INR: 2.1 (ref 2.0–3.0)

## 2020-11-22 NOTE — Patient Instructions (Signed)
continue with warfarin 7.5mg  daily. INR in 6 weeks.  Call [phone] if you need to change the appointment.  Patients requesting [address]

## 2020-11-23 ENCOUNTER — Other Ambulatory Visit (HOSPITAL_COMMUNITY): Payer: Self-pay | Admitting: Cardiology

## 2020-11-26 ENCOUNTER — Ambulatory Visit (INDEPENDENT_AMBULATORY_CARE_PROVIDER_SITE_OTHER): Payer: Medicare HMO | Admitting: Podiatry

## 2020-11-26 ENCOUNTER — Other Ambulatory Visit: Payer: Self-pay

## 2020-11-26 ENCOUNTER — Encounter: Payer: Self-pay | Admitting: Podiatry

## 2020-11-26 DIAGNOSIS — M79674 Pain in right toe(s): Secondary | ICD-10-CM

## 2020-11-26 DIAGNOSIS — B351 Tinea unguium: Secondary | ICD-10-CM

## 2020-11-26 DIAGNOSIS — M79675 Pain in left toe(s): Secondary | ICD-10-CM | POA: Diagnosis not present

## 2020-11-26 DIAGNOSIS — E1151 Type 2 diabetes mellitus with diabetic peripheral angiopathy without gangrene: Secondary | ICD-10-CM

## 2020-12-01 NOTE — Progress Notes (Addendum)
Subjective: [name] presents today for at risk foot care. Pt has h/o NIDDM with chronic kidney disease and painful thick toenails that are difficult to trim. Pain interferes with ambulation. Aggravating factors include wearing enclosed shoe gear. Pain is relieved with periodic professional debridement.Osborne Casco, [name], MD is patient's PCP. Last visit was 07/09/2020.  She states she is having a bout of diarrhea. She is now taking probiotics.   Allergies  Allergen Reactions  . Flagyl [Metronidazole] Rash    ALL-OVER BODY RASH  . Coreg [Carvedilol] Other (See Comments)    Terrible cramping in the feet and had a lot of bowel movements, but not diarrhea  . Losartan Swelling    Patient doesn't recall site of swelling  . Verapamil Hives  . Zetia [Ezetimibe] Other (See Comments)    Reaction not recalled  . Zocor [Simvastatin - High Dose] Other (See Comments)    Reaction not recalled  . Gatifloxacin Rash    Redness to skin around eye    Objective: There were no vitals filed for this visit.  [name] is a pleasant 80 y.o. Caucasian female in NAD. AAO X 3.  Vascular Examination: Capillary fill time to digits <3 seconds b/l lower extremities. Faintly palpable pedal pulses b/l. Pedal hair absent. Lower extremity skin temperature gradient within normal limits. +1 pitting edema b/l lower extremities. No ischemia or gangrene noted b/l lower extremities. Wearing open-toed compression hose.   Dermatological Examination: Pedal skin is thin shiny, atrophic b/l lower extremities. No open wounds bilaterally. No interdigital macerations bilaterally. Toenails 1-5 b/l elongated, discolored, dystrophic, thickened, crumbly with subungual debris and tenderness to dorsal palpation.  Musculoskeletal Examination: Normal muscle strength 5/5 to all lower extremity muscle groups bilaterally. No pain crepitus or joint limitation noted with ROM b/l. No gross bony deformities  bilaterally.  Neurological Examination: Protective sensation intact 5/5 intact bilaterally with 10g monofilament b/l. Vibratory sensation intact b/l. Proprioception intact bilaterally.  Assessment: 1. Pain due to onychomycosis of toenails of both feet   2. Type II diabetes mellitus with peripheral circulatory disorder (HCC)     Plan: -Examined patient. -No new findings. No new orders. -Continue diabetic foot care principles. -Patient to continue soft, supportive shoe gear daily. -Toenails 1-5 b/l were debrided in length and girth with sterile nail nippers and dremel without iatrogenic bleeding.  -Patient to report any pedal injuries to medical professional immediately. -Patient/POA to call should there be question/concern in the interim.  Return in about 3 months (around 02/23/2021).  Marzetta Board, DPM

## 2020-12-06 ENCOUNTER — Other Ambulatory Visit: Payer: Self-pay

## 2020-12-06 ENCOUNTER — Inpatient Hospital Stay: Payer: Medicare HMO

## 2020-12-06 ENCOUNTER — Telehealth: Payer: Self-pay | Admitting: Adult Health

## 2020-12-06 VITALS — BP 127/59 | HR 57 | Resp 16

## 2020-12-06 DIAGNOSIS — D500 Iron deficiency anemia secondary to blood loss (chronic): Secondary | ICD-10-CM

## 2020-12-06 DIAGNOSIS — D696 Thrombocytopenia, unspecified: Secondary | ICD-10-CM

## 2020-12-06 DIAGNOSIS — N189 Chronic kidney disease, unspecified: Secondary | ICD-10-CM | POA: Diagnosis not present

## 2020-12-06 DIAGNOSIS — I5032 Chronic diastolic (congestive) heart failure: Secondary | ICD-10-CM | POA: Diagnosis not present

## 2020-12-06 DIAGNOSIS — E11319 Type 2 diabetes mellitus with unspecified diabetic retinopathy without macular edema: Secondary | ICD-10-CM | POA: Diagnosis not present

## 2020-12-06 DIAGNOSIS — N184 Chronic kidney disease, stage 4 (severe): Secondary | ICD-10-CM | POA: Diagnosis not present

## 2020-12-06 DIAGNOSIS — K219 Gastro-esophageal reflux disease without esophagitis: Secondary | ICD-10-CM | POA: Diagnosis not present

## 2020-12-06 DIAGNOSIS — E7800 Pure hypercholesterolemia, unspecified: Secondary | ICD-10-CM | POA: Diagnosis not present

## 2020-12-06 DIAGNOSIS — D594 Other nonautoimmune hemolytic anemias: Secondary | ICD-10-CM

## 2020-12-06 DIAGNOSIS — D631 Anemia in chronic kidney disease: Secondary | ICD-10-CM | POA: Diagnosis not present

## 2020-12-06 DIAGNOSIS — E1122 Type 2 diabetes mellitus with diabetic chronic kidney disease: Secondary | ICD-10-CM | POA: Diagnosis not present

## 2020-12-06 DIAGNOSIS — I776 Arteritis, unspecified: Secondary | ICD-10-CM | POA: Diagnosis not present

## 2020-12-06 DIAGNOSIS — D638 Anemia in other chronic diseases classified elsewhere: Secondary | ICD-10-CM | POA: Diagnosis not present

## 2020-12-06 DIAGNOSIS — I739 Peripheral vascular disease, unspecified: Secondary | ICD-10-CM

## 2020-12-06 DIAGNOSIS — I130 Hypertensive heart and chronic kidney disease with heart failure and stage 1 through stage 4 chronic kidney disease, or unspecified chronic kidney disease: Secondary | ICD-10-CM | POA: Diagnosis not present

## 2020-12-06 DIAGNOSIS — D508 Other iron deficiency anemias: Secondary | ICD-10-CM

## 2020-12-06 DIAGNOSIS — I482 Chronic atrial fibrillation, unspecified: Secondary | ICD-10-CM | POA: Diagnosis not present

## 2020-12-06 LAB — RETICULOCYTES
Immature Retic Fract: 8.5 % (ref 2.3–15.9)
RBC.: 3.19 MIL/uL — ABNORMAL LOW (ref 3.87–5.11)
Retic Count, Absolute: 56.5 10*3/uL (ref 19.0–186.0)
Retic Ct Pct: 1.8 % (ref 0.4–3.1)

## 2020-12-06 LAB — CBC WITH DIFFERENTIAL/PLATELET
Abs Immature Granulocytes: 0.05 10*3/uL (ref 0.00–0.07)
Basophils Absolute: 0.1 10*3/uL (ref 0.0–0.1)
Basophils Relative: 1 %
Eosinophils Absolute: 0.2 10*3/uL (ref 0.0–0.5)
Eosinophils Relative: 3 %
HCT: 31.5 % — ABNORMAL LOW (ref 36.0–46.0)
Hemoglobin: 10.1 g/dL — ABNORMAL LOW (ref 12.0–15.0)
Immature Granulocytes: 1 %
Lymphocytes Relative: 13 %
Lymphs Abs: 1 10*3/uL (ref 0.7–4.0)
MCH: 31.5 pg (ref 26.0–34.0)
MCHC: 32.1 g/dL (ref 30.0–36.0)
MCV: 98.1 fL (ref 80.0–100.0)
Monocytes Absolute: 0.6 10*3/uL (ref 0.1–1.0)
Monocytes Relative: 8 %
Neutro Abs: 5.4 10*3/uL (ref 1.7–7.7)
Neutrophils Relative %: 74 %
Platelets: 162 10*3/uL (ref 150–400)
RBC: 3.21 MIL/uL — ABNORMAL LOW (ref 3.87–5.11)
RDW: 15.7 % — ABNORMAL HIGH (ref 11.5–15.5)
WBC: 7.4 10*3/uL (ref 4.0–10.5)
nRBC: 0 % (ref 0.0–0.2)

## 2020-12-06 LAB — SAVE SMEAR(SSMR), FOR PROVIDER SLIDE REVIEW

## 2020-12-06 LAB — FERRITIN: Ferritin: 216 ng/mL (ref 11–307)

## 2020-12-06 MED ORDER — EPOETIN ALFA-EPBX 10000 UNIT/ML IJ SOLN
INTRAMUSCULAR | Status: AC
  Filled 2020-12-06: qty 2

## 2020-12-06 MED ORDER — EPOETIN ALFA-EPBX 40000 UNIT/ML IJ SOLN
40000.0000 [IU] | Freq: Once | INTRAMUSCULAR | Status: AC
  Administered 2020-12-06: 40000 [IU] via SUBCUTANEOUS

## 2020-12-06 MED ORDER — EPOETIN ALFA-EPBX 40000 UNIT/ML IJ SOLN
INTRAMUSCULAR | Status: AC
  Filled 2020-12-06: qty 1

## 2020-12-06 MED ORDER — EPOETIN ALFA-EPBX 10000 UNIT/ML IJ SOLN
20000.0000 [IU] | Freq: Once | INTRAMUSCULAR | Status: AC
  Administered 2020-12-06: 20000 [IU] via SUBCUTANEOUS

## 2020-12-06 NOTE — Patient Instructions (Signed)

## 2020-12-06 NOTE — Telephone Encounter (Signed)
I called to get patient scheduled for an additional dose of Evusheld per FDA updated guidelines.  Unable to reach her, unable to leave message.  Wilber Bihari, NP

## 2020-12-09 ENCOUNTER — Telehealth (HOSPITAL_COMMUNITY): Payer: Self-pay | Admitting: Pharmacy Technician

## 2020-12-09 ENCOUNTER — Other Ambulatory Visit (HOSPITAL_COMMUNITY): Payer: Self-pay | Admitting: *Deleted

## 2020-12-09 MED ORDER — TADALAFIL (PAH) 20 MG PO TABS: 40.0000 mg | ORAL_TABLET | Freq: Every day | ORAL | 11 refills | Status: DC

## 2020-12-09 NOTE — Telephone Encounter (Signed)
Patient called in this morning to discuss a grant she was using for Tadalafil. It sounds like her grant is inactive, there are no open [place] grants available at this time. The patient is going to use a goodrx card and fill the medication at [facility].  Sent Jasmine (CMA), a message requesting that a new RX be sent in.  Charlann Boxer, CPhT

## 2020-12-10 ENCOUNTER — Other Ambulatory Visit (HOSPITAL_COMMUNITY): Payer: Self-pay | Admitting: *Deleted

## 2020-12-10 ENCOUNTER — Other Ambulatory Visit: Payer: Self-pay | Admitting: Physician Assistant

## 2020-12-10 ENCOUNTER — Telehealth (HOSPITAL_COMMUNITY): Payer: Self-pay | Admitting: Pharmacy Technician

## 2020-12-10 DIAGNOSIS — I776 Arteritis, unspecified: Secondary | ICD-10-CM

## 2020-12-10 DIAGNOSIS — I7782 Antineutrophilic cytoplasmic antibody (ANCA) vasculitis: Secondary | ICD-10-CM

## 2020-12-10 MED ORDER — TADALAFIL 20 MG PO TABS: 40.0000 mg | ORAL_TABLET | Freq: Every day | ORAL | 3 refills | Status: DC

## 2020-12-10 NOTE — Progress Notes (Signed)
I reviewed with Ms. Machorro that we received updated FDA guidance that all patients who previously received Tixagevimab150mg /Cilgavimab150mg  should receive another 150mg  dose to equal the new updated dose of 300mg Tixagevimab/300mg  Cilgavimab.  Patient verbalized understanding and agreed to updated dose.  They will receive this dose 3/29 when she comes in for her Rituxin.  Orders placed.  Nell Range PA-C

## 2020-12-10 NOTE — Telephone Encounter (Signed)
Received a PA request for Tadalafil for this patient. Called Kristopher Oppenheim and reminded them that the patient will be using a goodrx card for this medication, not insurance.  Cancelled PA request.  Charlann Boxer, CPhT

## 2020-12-13 DIAGNOSIS — R102 Pelvic and perineal pain: Secondary | ICD-10-CM | POA: Diagnosis not present

## 2020-12-13 DIAGNOSIS — Z952 Presence of prosthetic heart valve: Secondary | ICD-10-CM | POA: Diagnosis not present

## 2020-12-13 DIAGNOSIS — N390 Urinary tract infection, site not specified: Secondary | ICD-10-CM | POA: Diagnosis not present

## 2020-12-13 DIAGNOSIS — Z7901 Long term (current) use of anticoagulants: Secondary | ICD-10-CM | POA: Diagnosis not present

## 2020-12-13 DIAGNOSIS — I482 Chronic atrial fibrillation, unspecified: Secondary | ICD-10-CM | POA: Diagnosis not present

## 2020-12-13 DIAGNOSIS — E11319 Type 2 diabetes mellitus with unspecified diabetic retinopathy without macular edema: Secondary | ICD-10-CM | POA: Diagnosis not present

## 2020-12-13 DIAGNOSIS — N184 Chronic kidney disease, stage 4 (severe): Secondary | ICD-10-CM | POA: Diagnosis not present

## 2020-12-15 ENCOUNTER — Other Ambulatory Visit (HOSPITAL_COMMUNITY): Payer: Self-pay | Admitting: Cardiology

## 2020-12-16 ENCOUNTER — Telehealth (HOSPITAL_COMMUNITY): Payer: Self-pay | Admitting: Pharmacist

## 2020-12-16 NOTE — Telephone Encounter (Signed)
Patient Advocate Encounter   Received notification from [facility] that prior authorization for tadalafil is required.   PA submitted via fax Status is pending   Will continue to follow.   Audry Riles, PharmD, BCPS, BCCP, CPP Heart Failure Clinic Pharmacist [phone]'

## 2020-12-20 NOTE — Telephone Encounter (Signed)
Advanced Heart Failure Patient Advocate Encounter  Prior Authorization for Tadalafil has been approved.    Effective dates: 12/16/20 through 10/08/21  Charlann Boxer, CPhT

## 2020-12-29 DIAGNOSIS — N3946 Mixed incontinence: Secondary | ICD-10-CM | POA: Diagnosis not present

## 2020-12-29 DIAGNOSIS — R350 Frequency of micturition: Secondary | ICD-10-CM | POA: Diagnosis not present

## 2020-12-29 DIAGNOSIS — R351 Nocturia: Secondary | ICD-10-CM | POA: Diagnosis not present

## 2020-12-30 ENCOUNTER — Telehealth: Payer: Self-pay | Admitting: Oncology

## 2020-12-30 NOTE — Telephone Encounter (Signed)
Cancelled 3/28 appts per sch msg. Called and spoke with patient. Confirmed cancellation

## 2020-12-31 ENCOUNTER — Other Ambulatory Visit: Payer: Self-pay

## 2020-12-31 ENCOUNTER — Ambulatory Visit (INDEPENDENT_AMBULATORY_CARE_PROVIDER_SITE_OTHER): Payer: Medicare HMO

## 2020-12-31 DIAGNOSIS — Z952 Presence of prosthetic heart valve: Secondary | ICD-10-CM

## 2020-12-31 DIAGNOSIS — Z7901 Long term (current) use of anticoagulants: Secondary | ICD-10-CM

## 2020-12-31 DIAGNOSIS — Z5181 Encounter for therapeutic drug level monitoring: Secondary | ICD-10-CM

## 2020-12-31 LAB — POCT INR: INR: 2.1 (ref 2.0–3.0)

## 2020-12-31 NOTE — Patient Instructions (Signed)
-   continue with warfarin 7.5mg  daily.  - INR in 6 weeks.   Call [phone] if you need to change the appointment or if you have a procedure.

## 2021-01-03 ENCOUNTER — Inpatient Hospital Stay: Payer: Medicare HMO

## 2021-01-03 NOTE — Progress Notes (Signed)
[name]  [phone] Fax:([phone]) [phone]    ID: [name] DOB: 27-Oct-1940  MR#: [id number]  NAT#:[id number]  Patient Care Team: [name], MD as PCP - General (Internal Medicine) [name], MD as PCP - Advanced Heart Failure (Cardiology) Lorretta Harp, MD as PCP - Cardiology (Cardiology) Gailene Youkhana, [name], MD as Consulting Physician (Hematology and Oncology) Arta Silence, MD as Consulting Physician (Gastroenterology) [name], MD as Consulting Physician (Cardiology) Marzetta Board, DPM as Consulting Physician (Podiatry) Gavin Pound, MD as Consulting Physician (Rheumatology) Jon Billings, RN as [place] Management Corliss Parish, MD as Consulting Physician (Nephrology) OTHER MD:   CHIEF COMPLAINT: anemia of renal failure; vasculitis  CURRENT TREATMENT: Retacrit, rituximab   INTERVAL HISTORY: [name] returns today for follow-up and treatment of her anemia of renal failure and vasculitis.  She is doing here by her neighbor.  She receives Retacrit every 4 weeks.  This has brought her hemoglobin up to just over 10, which is our goal.  She has no symptoms from anemia at that level of hemoglobin.  She tolerates those treatments with no side effects that she is aware of.  She is now receiving rituximab every 12 weeks.  She tolerates these treatments remarkably well, with no side effects from the rituximab that she is aware of.   We are following her hemoglobin at present   Lab Results  Component Value Date   HGB 10.3 (L) 01/04/2021   HGB 10.1 (L) 12/06/2020   HGB 9.2 (L) 11/08/2020   HGB 11.1 (L) 10/12/2020   HGB 10.1 (L) 09/13/2020    REVIEW OF SYSTEMS: [name] tells me she now has a trigger finger in her left hand.  This is a problem because she likes to do alterations.  She tells me her bladder prolapse is getting a little bit worse.  On the plus side she no longer has any leg  swelling.  She says her taste is altered and her mouth feels yucky.  It is dry.  She thinks this is due to her blood pressure medicines.  A detailed review of systems today was otherwise stable   COVID 19 VACCINATION STATUS: Has received Pfizer x2  with booster December 2021     HISTORY OF CURRENT ILLNESS: From the original consult note:  [name] is an 80 y.o. female from [place], [place].  She has a past medical history significant forPAD s/[name] stenting; moderate to severe pulmonary HTN; afib and AVR on Coumadin; HTN; HLD; DM; and CAD s/[name] CABG. the patient was admitted to the hospital for anemia.  She was having increasing fatigue and shortness of breath and had routine lab work drawn which showed a hemoglobin of 6.6.  Due to her abnormal lab work, she was advised to come to the emergency room for admission.  She was also admitted in February 2020 for symptomatic anemia.  She reports she has been anemic for at least a year and has been taking oral iron.  She underwent a colonoscopy and EGD on 01/22/2018.  The colonoscopy showed diverticulosis in the sigmoid colon and descending colon, internal hemorrhoids.  The upper endoscopy was normal except for erythematous mucosa in the antrum.  She had a repeat EGD on 11/21/2018 that showed a few minimally oozing( with scope trauma) superficial gastric ulcers with pigmented material were found in the gastric body. The largest lesion was 4 mm in largest dimension.  She underwent another upper endoscopy on  02/27/2019 which showed patchy mildly friable mucosa with contact bleeding was found in the gastric fundus, in the gastric body and in the gastric antrum.  The patient is maintained on warfarin due to A. fib and a mechanical aortic valve.  Review of the patient's chart shows that she has been anemic dating back to at least December thousand 12 (these are the earliest labs available to me).  Her highest hemoglobin was 11.6 in December 2012.  I also note  that the patient has had intermittent mild leukopenia adding back to at least April 2019.  Her lowest white blood cell count was 2.6 in April 2019.  Since admission, the patient has received 1 unit of packed red blood cells.  She is currently on heparin for her procedure with plan to bridge back to Coumadin.  Stool for occult blood was negative x1 on 02/24/2019.  Hematology was asked see the patient to make recommendations regarding her anemia.  [name] subsequent history is as detailed below.   PAST MEDICAL HISTORY: Past Medical History:  Diagnosis Date  . Aortic atherosclerosis ([place]) 01/23/2018  . Atrial fibrillation ([place])   . Carotid artery disease ([place])   . Cholelithiasis 01/23/2018  . Chronic anticoagulation   . Coronary artery disease    status post coronary artery bypass grafting times 07/10/2004  . Diabetes mellitus   . Hypercholesteremia   . Hypertension   . Mechanical heart valve present    H. aortic valve replacement at the time of bypass surgery October 2005  . Moderate to severe pulmonary hypertension ([place])   . Peripheral arterial disease (HCC)    history of left common iliac artery PTA and stenting for a chronic total occlusion 08/26/01    PAST SURGICAL HISTORY: Past Surgical History:  Procedure Laterality Date  . AORTIC VALVE REPLACEMENT    . CARDIAC CATHETERIZATION  11/10/2004   40% right common illiac, 70% in stent restenosis of distal left common illiac,   . CARDIAC CATHETERIZATION  05/18/2004   LAD 50-70% midstenosis, RCA dominant w/50% stenosis, 50% Right common Illiac artery ostial stenosis, 90% in stent restenosis within midportion of left common illiac stent  . Carotid Duplex  03/12/2012   RSA-elev. velocities suggestive of a 50-69% diameter reduction, Right&Left Bulb/Prox ICA-mild-mod.fibrous plaqueelevating Velocities abnormal study.  . CHOLECYSTECTOMY N/A 03/01/2018   Procedure: LAPAROSCOPIC CHOLECYSTECTOMY;  Surgeon: Judeth Horn, MD;  Location: [facility];  Service: General;  Laterality: N/A;  . COLONOSCOPY WITH PROPOFOL N/A 01/22/2018   Procedure: COLONOSCOPY WITH PROPOFOL;  Surgeon: Wilford Corner, MD;  Location: [facility];  Service: Endoscopy;  Laterality: N/A;  . ESOPHAGOGASTRODUODENOSCOPY (EGD) WITH PROPOFOL N/A 01/22/2018   Procedure: ESOPHAGOGASTRODUODENOSCOPY (EGD) WITH PROPOFOL;  Surgeon: Wilford Corner, MD;  Location: [place] [facility];  Service: Endoscopy;  Laterality: N/A;  . ESOPHAGOGASTRODUODENOSCOPY (EGD) WITH PROPOFOL N/A 11/21/2018   Procedure: ESOPHAGOGASTRODUODENOSCOPY (EGD) WITH PROPOFOL;  Surgeon: [name], MD;  Location: [facility];  Service: Gastroenterology;  Laterality: N/A;  . ESOPHAGOGASTRODUODENOSCOPY (EGD) WITH PROPOFOL Left 02/27/2019   Procedure: ESOPHAGOGASTRODUODENOSCOPY (EGD) WITH PROPOFOL;  Surgeon: Arta Silence, MD;  Location: [facility];  Service: Endoscopy;  Laterality: Left;  . GIVENS CAPSULE STUDY N/A 11/21/2018   Procedure: GIVENS CAPSULE STUDY;  Surgeon: [name], MD;  Location: [facility];  Service: Gastroenterology;  Laterality: N/A;  To be deployed during EGD  . Lower Ext. Duplex  03/12/2012   Right Proximal CIA- vessel narrowing w/elevated velocities 0-49% diameter reduction. Right SFA-mild mixed density plaque throughout vessel.  [name] NM [facility]  PERF WALL MOTION  05/19/2010   protocol: Persantine, post stress EF 65%, negative for ischemia, low risk scan  . RIGHT HEART CATH N/A 06/27/2018   Procedure: RIGHT HEART CATH;  Surgeon: [name], MD;  Location: [facility] CV LAB;  Service: Cardiovascular;  Laterality: N/A;  . TRANSTHORACIC ECHOCARDIOGRAM  08/29/2012   Moderately calcified annulus of mitral valve, moderate regurg. of both mitral valve and tricuspid valve.     FAMILY HISTORY: Family History  Problem Relation Age of Onset  . Breast cancer Neg Hx     GYNECOLOGIC HISTORY:  No LMP recorded. Patient has had a hysterectomy. Menarche:  years old Age at first live birth:   [name]: 1 LMP:  Contraceptive:  HRT:   Hysterectomy?: yes BSO?:    SOCIAL HISTORY: (Current as of 03/07/2019) [name] is widowed.  She lives by herself.  She has 3 children that live locally.  She smoked three quarters of a pack of cigarettes daily for 50 years.  She quit in 2005.  Denies alcohol use.   ADVANCED DIRECTIVES: Not in place    HEALTH MAINTENANCE: Social History   Tobacco Use  . Smoking status: Former Smoker    Packs/day: 1.00    Years: 30.00    Pack years: 30.00    Types: Cigarettes  . Smokeless tobacco: Never Used  . Tobacco comment: quit smoking 2005  Vaping Use  . Vaping Use: Never used  Substance Use Topics  . Alcohol use: No    Alcohol/week: 0.0 standard drinks  . Drug use: No     Colonoscopy: EGD on 02/27/2019 under Dr. Paulita Fujita found some friable gastric mucosa.  Colonoscopy under Dr. Michail Sermon 01/22/2018 showed significant diverticular disease  PAP:   Bone density: May 2017 showed osteopenia   Allergies  Allergen Reactions  . Flagyl [Metronidazole] Rash    ALL-OVER BODY RASH  . Coreg [Carvedilol] Other (See Comments)    Terrible cramping in the feet and had a lot of bowel movements, but not diarrhea  . Losartan Swelling    Patient doesn't recall site of swelling  . Verapamil Hives  . Zetia [Ezetimibe] Other (See Comments)    Reaction not recalled  . Zocor [Simvastatin - High Dose] Other (See Comments)    Reaction not recalled  . Gatifloxacin Rash    Redness to skin around eye    Current Outpatient Medications  Medication Sig Dispense Refill  . acetaminophen (TYLENOL) 325 MG tablet Take 650 mg by mouth every 6 (six) hours as needed for mild pain.     [name] allopurinol (ZYLOPRIM) 100 MG tablet Take 100 mg by mouth daily.    [name] ALPRAZolam (XANAX) 0.5 MG tablet Take 0.25 mg by mouth at bedtime.     [name] amLODipine (NORVASC) 10 MG tablet Take 1 tablet (10 mg total) by mouth daily. 90 tablet 3  . atorvastatin (LIPITOR) 80 MG tablet Take 1 tablet (80 mg  total) by mouth daily at 6 PM.    . BESIVANCE 0.6 % SUSP Place 1 drop into the right eye 3 (three) times daily.    . Cholecalciferol (VITAMIN D3) 1000 UNITS CAPS Take 2,000 Units by mouth daily with lunch.     . cloNIDine (CATAPRES) 0.2 MG tablet TAKE 1 TABLET TWICE DAILY 180 tablet 3  . colchicine 0.6 MG tablet     . Cyanocobalamin (VITAMIN B12) 1000 MCG TBCR Take 1,000 mcg by mouth daily with lunch.     . DUREZOL 0.05 % EMUL Place  1 drop into the right eye 3 (three) times daily.    . furosemide (LASIX) 40 MG tablet TAKE 2 TABLETS (80 MG TOTAL)  EVERY MORNING AND 1 TABLET (40 MG TOTAL) EVERY EVENING. 270 tablet 1  . furosemide (LASIX) 80 MG tablet Take 1 tablet (80 mg total) by mouth daily. 90 tablet 3  . gatifloxacin (ZYMAXID) 0.5 % SOLN Place 1 drop into the right eye 4 (four) times daily.     . hydrALAZINE (APRESOLINE) 100 MG tablet TAKE 1 TABLET THREE TIMES DAILY 270 tablet 3  . ketorolac (ACULAR) 0.5 % ophthalmic solution Place 1 drop into the right eye 4 (four) times daily.    [name] lactose free nutrition (BOOST) LIQD Take 237 mLs by mouth daily. (To supplement meals)     . magnesium oxide (MAG-OX) 400 (241.3 Mg) MG tablet Take 1 tablet (400 mg total) by mouth daily.    . metFORMIN (GLUCOPHAGE) 500 MG tablet Take 500 mg by mouth 2 (two) times daily with a meal.     . metoprolol succinate (TOPROL XL) 25 MG 24 hr tablet Take 0.5 tablets (12.5 mg total) by mouth daily. 45 tablet 3  . pantoprazole (PROTONIX) 40 MG tablet Take 40 mg by mouth 2 (two) times daily.     [name] PROLENSA 0.07 % SOLN SMARTSIG:1 Drop(s) Right Eye Every Evening    . spironolactone (ALDACTONE) 25 MG tablet TAKE 1 TABLET EVERY DAY 90 tablet 3  . tadalafil (CIALIS) 20 MG tablet Take 2 tablets (40 mg total) by mouth daily. For [place]. 60 tablet 3  . tobramycin (TOBREX) 0.3 % ophthalmic solution     . vitamin C (ASCORBIC ACID) 500 MG tablet Take 500 mg by mouth daily with lunch.    . warfarin (COUMADIN) 5 MG tablet TAKE 1 AND 1/2  TABLETS TO 2 TABLETS DAILY AS DIRECTED BY COUMADIN CLINIC 180 tablet 1   No current facility-administered medications for this visit.    OBJECTIVE: White woman in no acute distress Vitals:   01/04/21 1129  BP: (!) 157/80  Pulse: (!) 57  Resp: 18  Temp: 98.1 F (36.7 C)  SpO2: 98%   Wt Readings from Last 3 Encounters:  01/04/21 125 lb 8 oz (56.9 kg)  10/12/20 125 lb 14.4 oz (57.1 kg)  08/17/20 129 lb 6.4 oz (58.7 kg)   Body mass index is 24.51 kg/m.    ECOG FS:1 - Symptomatic but completely ambulatory  Sclerae unicteric, EOMs intact Wearing a mask No cervical or supraclavicular adenopathy Lungs no rales or rhonchi Heart regular rate and rhythm, 3/6 systolic murmur as previously noted Abd soft, nontender, positive bowel sounds MSK no focal spinal tenderness, no lower extremity lymphedema Neuro: nonfocal, well oriented, appropriate affect Breasts: Deferred   LAB RESULTS:  CMP     Component Value Date/Time   NA 133 (L) 03/30/2020 0932   NA 137 12/06/2017 1436   K 4.4 03/30/2020 0932   CL 103 03/30/2020 0932   CO2 20 (L) 03/30/2020 0932   GLUCOSE 152 (H) 03/30/2020 0932   BUN 57 (H) 03/30/2020 0932   BUN 20 12/06/2017 1436   CREATININE 1.73 (H) 03/30/2020 0932   CALCIUM 9.3 03/30/2020 0932   PROT 6.3 (L) 03/30/2020 0932   ALBUMIN 3.6 03/30/2020 0932   AST 21 03/30/2020 0932   ALT 16 03/30/2020 0932   ALKPHOS 83 03/30/2020 0932   BILITOT 0.4 03/30/2020 0932   GFRNONAA 27 (L) 03/30/2020 0932   GFRAA 32 (L) 03/30/2020 0932  Lab Results  Component Value Date   TOTALPROTELP 6.9 05/07/2019    No results found for: Nils Pyle, [place] [facility] [facility]  Lab Results  Component Value Date   WBC 5.0 01/04/2021   NEUTROABS 3.1 01/04/2021   HGB 10.3 (L) 01/04/2021   HCT 30.9 (L) 01/04/2021   MCV 96.9 01/04/2021   PLT 165 01/04/2021    No results found for: LABCA2  No components found for: DJMEQA834  Recent Labs  Lab 12/31/20 1022  INR 2.1    No  results found for: LABCA2  No results found for: HDQ222  No results found for: LNL892  No results found for: JJH417  No results found for: CA2729  No components found for: HGQUANT  No results found for: CEA1 / No results found for: CEA1   No results found for: AFPTUMOR  No results found for: CHROMOGRNA  No results found for: HGBA, HGBA2QUANT, HGBFQUANT, HGBSQUAN (Hemoglobinopathy evaluation)   Lab Results  Component Value Date   LDH 212 (H) 08/04/2019    Lab Results  Component Value Date   IRON 123 03/30/2020   TIBC 268 03/30/2020   IRONPCTSAT 46 03/30/2020   (Iron and TIBC)  Lab Results  Component Value Date   FERRITIN 216 12/06/2020    Urinalysis    Component Value Date/Time   COLORURINE YELLOW 07/07/2019 0209   APPEARANCEUR CLOUDY (A) 07/07/2019 0209   LABSPEC 1.011 07/07/2019 0209   PHURINE 6.0 07/07/2019 0209   GLUCOSEU NEGATIVE 07/07/2019 0209   HGBUR NEGATIVE 07/07/2019 0209   BILIRUBINUR NEGATIVE 07/07/2019 0209   KETONESUR NEGATIVE 07/07/2019 0209   PROTEINUR >=300 (A) 07/07/2019 0209   UROBILINOGEN 1.0 10/06/2011 1709   NITRITE NEGATIVE 07/07/2019 0209   LEUKOCYTESUR LARGE (A) 07/07/2019 0209    STUDIES:  No results found.   ELIGIBLE FOR AVAILABLE RESEARCH PROTOCOL: no   ASSESSMENT: 80 y.o. [place] woman with multifactorial anemia, as follows:             (a) anemia of chronic inflammation: As of May 2020 she has a SED rate of 95, CRP 1.1, positive ANA and RF; subsequently she was found to be ANCA positive             (b) hemolysis (mild): she has a mildly elevated LDH and a DAT positive for complement, not IgG; this is c/w (a) above             (c) anemia of renal insufficiency: inadequate EPO resonse to anemia              (d) GIB/ iron deficiency in patient on lifelong anticoagulaton  (1) Feraheme received 03/01/2019, repeated 03/20/2019   (a) reticulocyte 03/07/2019 up from 43.4 a year ago to 191.8 after iron infusion  (b)  feraheme repeated on 05/12/2019  (c) subsequent ferritin levels >100  (2) darbepoetin: starting 05/05/2019 at 166mcg given every 2 weeks  (a) dose increased to 200 mcg every week starting 07/01/2019   (b) back to every 2 weeks beginning 08/04/2019 still at 200 mcg dose  (c) switched to Retacrit every 4 weeks as of 08/18/2019  (3) ANCA positive vasculitis: diagnosed 05/08/2019 (titer 1:640)  (a) prednisone 40 mg daily started 05/31/2019  (b) rituximab weekly started 06/12/2019 (received test dose 06/05/2019)   (i) hepatitis B sAg, core Ab and HIV negative 05/08/2019   (ii) rituximab held after 07/14/2019 dose (received 5 weekly doses) with neutropenia   (iii) rituximab resumed 08/18/2019 but changed to monthly   (iv) rituximab changed to  every 2 months beginning with 02/02/2020 dose   (v) rituximab changed to every 12 weeks after the 10/12/2020 dose  (c) prednisone tapered to off as of 09/15/2019  (4) osteopenia with a T score of -1.5 on bone density 02/28/2016 at the breast center    PLAN: [name] is doing very well as far as her vasculitis and anemia are concerned.  Currently there is no clinical evidence of vasculitis.  Her hemoglobin is steadily over 610 at the current dose of Retacrit.  Accordingly I am not making any changes to her treatment.  I did discuss the fact that we have a transportation option for some of our patients.  Sometimes they do have to wait a while to be picked up and to be taken back.  She tells me for the time being however her neighbor is very willing to bring her and transportation is not an issue.  We will continue the Aranesp every 4 weeks and a rituximab every 12 weeks.  I will see her with each rituximab treatment.  Total encounter time 20 minutes.Sarajane Jews C. Jonatan Wilsey, MD 01/04/21 11:35 AM Medical Oncology and Hematology [facility] [place], [place] [address] Tel. [phone]    Fax. [phone]   I, Wilburn Mylar, am acting as scribe for Dr. [name]. Oluwadara Gorman.  I, Lurline Del MD, have reviewed the above documentation for accuracy and completeness, and I agree with the above.   *Total Encounter Time as defined by the Centers for Medicare and Medicaid Services includes, in addition to the face-to-face time of a patient visit (documented in the note above) non-face-to-face time: obtaining and reviewing outside history, ordering and reviewing medications, tests or procedures, care coordination (communications with other health care professionals or caregivers) and documentation in the medical record.

## 2021-01-04 ENCOUNTER — Other Ambulatory Visit: Payer: Self-pay

## 2021-01-04 ENCOUNTER — Inpatient Hospital Stay: Payer: Medicare HMO

## 2021-01-04 ENCOUNTER — Inpatient Hospital Stay: Payer: Medicare HMO | Attending: Oncology

## 2021-01-04 ENCOUNTER — Inpatient Hospital Stay (HOSPITAL_BASED_OUTPATIENT_CLINIC_OR_DEPARTMENT_OTHER): Payer: Medicare HMO | Admitting: Oncology

## 2021-01-04 VITALS — BP 164/60 | HR 54 | Temp 96.9°F | Resp 18

## 2021-01-04 VITALS — BP 157/80 | HR 57 | Temp 98.1°F | Resp 18 | Ht 60.0 in | Wt 125.5 lb

## 2021-01-04 DIAGNOSIS — Z87891 Personal history of nicotine dependence: Secondary | ICD-10-CM | POA: Insufficient documentation

## 2021-01-04 DIAGNOSIS — I776 Arteritis, unspecified: Secondary | ICD-10-CM

## 2021-01-04 DIAGNOSIS — D594 Other nonautoimmune hemolytic anemias: Secondary | ICD-10-CM

## 2021-01-04 DIAGNOSIS — M8580 Other specified disorders of bone density and structure, unspecified site: Secondary | ICD-10-CM | POA: Diagnosis not present

## 2021-01-04 DIAGNOSIS — I7782 Antineutrophilic cytoplasmic antibody (ANCA) vasculitis: Secondary | ICD-10-CM

## 2021-01-04 DIAGNOSIS — N189 Chronic kidney disease, unspecified: Secondary | ICD-10-CM | POA: Insufficient documentation

## 2021-01-04 DIAGNOSIS — D61818 Other pancytopenia: Secondary | ICD-10-CM

## 2021-01-04 DIAGNOSIS — I4891 Unspecified atrial fibrillation: Secondary | ICD-10-CM | POA: Diagnosis not present

## 2021-01-04 DIAGNOSIS — Z90710 Acquired absence of both cervix and uterus: Secondary | ICD-10-CM | POA: Diagnosis not present

## 2021-01-04 DIAGNOSIS — E1122 Type 2 diabetes mellitus with diabetic chronic kidney disease: Secondary | ICD-10-CM | POA: Diagnosis not present

## 2021-01-04 DIAGNOSIS — Z952 Presence of prosthetic heart valve: Secondary | ICD-10-CM | POA: Insufficient documentation

## 2021-01-04 DIAGNOSIS — D631 Anemia in chronic kidney disease: Secondary | ICD-10-CM | POA: Diagnosis not present

## 2021-01-04 DIAGNOSIS — D508 Other iron deficiency anemias: Secondary | ICD-10-CM

## 2021-01-04 DIAGNOSIS — D696 Thrombocytopenia, unspecified: Secondary | ICD-10-CM

## 2021-01-04 DIAGNOSIS — D500 Iron deficiency anemia secondary to blood loss (chronic): Secondary | ICD-10-CM

## 2021-01-04 DIAGNOSIS — I739 Peripheral vascular disease, unspecified: Secondary | ICD-10-CM

## 2021-01-04 LAB — CBC WITH DIFFERENTIAL/PLATELET
Abs Immature Granulocytes: 0.02 10*3/uL (ref 0.00–0.07)
Basophils Absolute: 0.1 10*3/uL (ref 0.0–0.1)
Basophils Relative: 1 %
Eosinophils Absolute: 0.2 10*3/uL (ref 0.0–0.5)
Eosinophils Relative: 4 %
HCT: 30.9 % — ABNORMAL LOW (ref 36.0–46.0)
Hemoglobin: 10.3 g/dL — ABNORMAL LOW (ref 12.0–15.0)
Immature Granulocytes: 0 %
Lymphocytes Relative: 20 %
Lymphs Abs: 1 10*3/uL (ref 0.7–4.0)
MCH: 32.3 pg (ref 26.0–34.0)
MCHC: 33.3 g/dL (ref 30.0–36.0)
MCV: 96.9 fL (ref 80.0–100.0)
Monocytes Absolute: 0.6 10*3/uL (ref 0.1–1.0)
Monocytes Relative: 12 %
Neutro Abs: 3.1 10*3/uL (ref 1.7–7.7)
Neutrophils Relative %: 63 %
Platelets: 165 10*3/uL (ref 150–400)
RBC: 3.19 MIL/uL — ABNORMAL LOW (ref 3.87–5.11)
RDW: 14.4 % (ref 11.5–15.5)
WBC: 5 10*3/uL (ref 4.0–10.5)
nRBC: 0 % (ref 0.0–0.2)

## 2021-01-04 LAB — RETICULOCYTES
Immature Retic Fract: 4.5 % (ref 2.3–15.9)
RBC.: 3.23 MIL/uL — ABNORMAL LOW (ref 3.87–5.11)
Retic Count, Absolute: 45.2 10*3/uL (ref 19.0–186.0)
Retic Ct Pct: 1.4 % (ref 0.4–3.1)

## 2021-01-04 LAB — SAVE SMEAR(SSMR), FOR PROVIDER SLIDE REVIEW

## 2021-01-04 LAB — FERRITIN: Ferritin: 240 ng/mL (ref 11–307)

## 2021-01-04 MED ORDER — EPOETIN ALFA-EPBX 40000 UNIT/ML IJ SOLN
40000.0000 [IU] | Freq: Once | INTRAMUSCULAR | Status: AC
  Administered 2021-01-04: 40000 [IU] via SUBCUTANEOUS

## 2021-01-04 MED ORDER — EPOETIN ALFA-EPBX 40000 UNIT/ML IJ SOLN
INTRAMUSCULAR | Status: AC
  Filled 2021-01-04: qty 1

## 2021-01-04 MED ORDER — EPOETIN ALFA-EPBX 10000 UNIT/ML IJ SOLN
20000.0000 [IU] | Freq: Once | INTRAMUSCULAR | Status: AC
  Administered 2021-01-04: 20000 [IU] via SUBCUTANEOUS

## 2021-01-04 MED ORDER — EPOETIN ALFA-EPBX 10000 UNIT/ML IJ SOLN
INTRAMUSCULAR | Status: AC
  Filled 2021-01-04: qty 2

## 2021-01-04 MED ORDER — SODIUM CHLORIDE 0.9 % IV SOLN
375.0000 mg/m2 | Freq: Once | INTRAVENOUS | Status: AC
  Administered 2021-01-04: 600 mg via INTRAVENOUS
  Filled 2021-01-04: qty 50

## 2021-01-04 MED ORDER — DIPHENHYDRAMINE HCL 25 MG PO CAPS
25.0000 mg | ORAL_CAPSULE | Freq: Once | ORAL | Status: AC
  Administered 2021-01-04: 25 mg via ORAL

## 2021-01-04 MED ORDER — ACETAMINOPHEN 325 MG PO TABS
650.0000 mg | ORAL_TABLET | Freq: Once | ORAL | Status: AC
  Administered 2021-01-04: 650 mg via ORAL

## 2021-01-04 MED ORDER — SODIUM CHLORIDE 0.9 % IV SOLN
Freq: Once | INTRAVENOUS | Status: AC
  Filled 2021-01-04: qty 250

## 2021-01-04 MED ORDER — ACETAMINOPHEN 325 MG PO TABS
ORAL_TABLET | ORAL | Status: AC
  Filled 2021-01-04: qty 2

## 2021-01-04 MED ORDER — DIPHENHYDRAMINE HCL 25 MG PO CAPS
ORAL_CAPSULE | ORAL | Status: AC
  Filled 2021-01-04: qty 1

## 2021-01-04 NOTE — Patient Instructions (Signed)
[facility] Discharge Instructions for Patients Receiving Chemotherapy  Today you received the following chemotherapy agents rituxan  To help prevent nausea and vomiting after your treatment, we encourage you to take your nausea medication as directed.   If you develop nausea and vomiting that is not controlled by your nausea medication, call the clinic.   BELOW ARE SYMPTOMS THAT SHOULD BE REPORTED IMMEDIATELY:  *FEVER GREATER THAN 100.5 F  *CHILLS WITH OR WITHOUT FEVER  NAUSEA AND VOMITING THAT IS NOT CONTROLLED WITH YOUR NAUSEA MEDICATION  *UNUSUAL SHORTNESS OF BREATH  *UNUSUAL BRUISING OR BLEEDING  TENDERNESS IN MOUTH AND THROAT WITH OR WITHOUT PRESENCE OF ULCERS  *URINARY PROBLEMS  *BOWEL PROBLEMS  UNUSUAL RASH Items with * indicate a potential emergency and should be followed up as soon as possible.  Feel free to call the clinic should you have any questions or concerns. The clinic phone number is [phone].  Please show the CHEMO ALERT CARD at check-in to the Emergency Department and triage nurse.   

## 2021-01-06 ENCOUNTER — Telehealth: Payer: Self-pay | Admitting: Oncology

## 2021-01-06 DIAGNOSIS — H903 Sensorineural hearing loss, bilateral: Secondary | ICD-10-CM | POA: Insufficient documentation

## 2021-01-06 DIAGNOSIS — H9203 Otalgia, bilateral: Secondary | ICD-10-CM | POA: Insufficient documentation

## 2021-01-06 NOTE — Telephone Encounter (Signed)
Scheduled per 3/29 los. Pt will receive an updated appt calendar per next visit appt notes  

## 2021-01-13 ENCOUNTER — Other Ambulatory Visit: Payer: Self-pay

## 2021-01-13 NOTE — Patient Instructions (Signed)
Goals    . Blood Pressure < 140/90    . Make and Keep All Appointments     Timeframe:  Long-Range Goal Priority:  High Start Date:    01/13/21                         Expected End Date:   07/08/21                    Follow Up Date 05/08/21   - ask family or friend for a ride    Why is this important?    Part of staying healthy is seeing the doctor for follow-up care.   If you forget your appointments, there are some things you can do to stay on track.    Notes: 01/13/21 Discussed transportation and reminded of [place] if assistance needed with transportation.      . Track and Manage Fluids and Swelling-Heart Failure     Timeframe:  Long-Range Goal Priority:  High Start Date:         01/13/21                    Expected End Date:   07/08/21                    Follow Up Date 05/08/21   - call office if I gain more than 2 pounds in one day or 5 pounds in one week - use salt in moderation    Why is this important?    It is important to check your weight daily and watch how much salt and liquids you have.   It will help you to manage your heart failure.    Notes:  01/13/21 Patient continues to weigh daily and use salt in moderation. Patient knows action plan.

## 2021-01-13 NOTE — Patient Outreach (Signed)
[facility]) Care Management  [place]  01/13/2021   [name] 1941-01-01 [id number]  Subjective: Telephone call to patient for follow up. Patient reports she is doing ok but dealing with bladder falling. She states she has a pending referral to GYN. Patient reports weight at 125 lbs. She continues to wear compression hose.  Discussed heart failure management.  She verbalized understanding.    Objective:   Encounter Medications:  Outpatient Encounter Medications as of 01/13/2021  Medication Sig  . acetaminophen (TYLENOL) 325 MG tablet Take 650 mg by mouth every 6 (six) hours as needed for mild pain.   [name] allopurinol (ZYLOPRIM) 100 MG tablet Take 100 mg by mouth daily.  [name] ALPRAZolam (XANAX) 0.5 MG tablet Take 0.25 mg by mouth at bedtime.  [name] amLODipine (NORVASC) 10 MG tablet Take 1 tablet (10 mg total) by mouth daily.  [name] atorvastatin (LIPITOR) 80 MG tablet Take 1 tablet (80 mg total) by mouth daily at 6 PM.  . BESIVANCE 0.6 % SUSP Place 1 drop into the right eye 3 (three) times daily.  . Cholecalciferol (VITAMIN D3) 1000 UNITS CAPS Take 2,000 Units by mouth daily with lunch.   . cloNIDine (CATAPRES) 0.2 MG tablet TAKE 1 TABLET TWICE DAILY  . colchicine 0.6 MG tablet   . Cyanocobalamin (VITAMIN B12) 1000 MCG TBCR Take 1,000 mcg by mouth daily with lunch.  . DUREZOL 0.05 % EMUL Place 1 drop into the right eye 3 (three) times daily.  . furosemide (LASIX) 40 MG tablet TAKE 2 TABLETS (80 MG TOTAL)  EVERY MORNING AND 1 TABLET (40 MG TOTAL) EVERY EVENING.  . furosemide (LASIX) 80 MG tablet Take 1 tablet (80 mg total) by mouth daily.  [name] gatifloxacin (ZYMAXID) 0.5 % SOLN Place 1 drop into the right eye 4 (four) times daily.   . hydrALAZINE (APRESOLINE) 100 MG tablet TAKE 1 TABLET THREE TIMES DAILY  . ketorolac (ACULAR) 0.5 % ophthalmic solution Place 1 drop into the right eye 4 (four) times daily.  [name] lactose free nutrition (BOOST) LIQD Take 237 mLs by mouth daily. (To  supplement meals)   . magnesium oxide (MAG-OX) 400 (241.3 Mg) MG tablet Take 1 tablet (400 mg total) by mouth daily.  . metFORMIN (GLUCOPHAGE) 500 MG tablet Take 500 mg by mouth 2 (two) times daily with a meal.  . metoprolol succinate (TOPROL XL) 25 MG 24 hr tablet Take 0.5 tablets (12.5 mg total) by mouth daily.  . pantoprazole (PROTONIX) 40 MG tablet Take 40 mg by mouth 2 (two) times daily.   [name] PROLENSA 0.07 % SOLN SMARTSIG:1 Drop(s) Right Eye Every Evening  . spironolactone (ALDACTONE) 25 MG tablet TAKE 1 TABLET EVERY DAY  . tadalafil (CIALIS) 20 MG tablet Take 2 tablets (40 mg total) by mouth daily. For [place].  [name] tobramycin (TOBREX) 0.3 % ophthalmic solution   . vitamin C (ASCORBIC ACID) 500 MG tablet Take 500 mg by mouth daily with lunch.  . warfarin (COUMADIN) 5 MG tablet TAKE 1 AND 1/2 TABLETS TO 2 TABLETS DAILY AS DIRECTED BY COUMADIN CLINIC   No facility-administered encounter medications on file as of 01/13/2021.    Functional Status:  In your present state of health, do you have any difficulty performing the following activities: 01/13/2021  Hearing? N  Vision? N  Difficulty concentrating or making decisions? N  Walking or climbing stairs? Y  Comment weakness  Dressing or bathing? N  Doing errands, shopping? Y  Comment son and neighbor  assists as needed  Preparing Food and eating ? N  Using the Toilet? N  In the past six months, have you accidently leaked urine? Y  Comment ocassional  Do you have problems with loss of bowel control? N  Managing your Medications? N  Managing your Finances? N  Housekeeping or managing your Housekeeping? Y  Comment family assists as needed  Some recent data might be hidden    Fall/Depression Screening: Fall Risk  01/13/2021 03/24/2020 01/26/2020  Falls in the past year? 0 0 0  Comment - - -  Number falls in past yr: - - -  Injury with Fall? - - -  Risk for fall due to : - - -  Risk for fall due to: Comment - - -  Follow up - - -   PHQ 2/9  Scores 01/13/2021 01/26/2020 11/25/2019 07/31/2019 05/12/2019 03/24/2019 07/09/2018  PHQ - 2 Score 0 1 1 2 3 1  0  PHQ- 9 Score - - - 9 9 - -    Assessment:  Goals Addressed            This Visit's Progress   . Make and Keep All Appointments       Timeframe:  Long-Range Goal Priority:  High Start Date:    01/13/21                         Expected End Date:   07/08/21                    Follow Up Date 05/08/21   - ask family or friend for a ride    Why is this important?    Part of staying healthy is seeing the doctor for follow-up care.   If you forget your appointments, there are some things you can do to stay on track.    Notes: 01/13/21 Discussed transportation and reminded of [place] if assistance needed with transportation.      . Track and Manage Fluids and Swelling-Heart Failure       Timeframe:  Long-Range Goal Priority:  High Start Date:         01/13/21                    Expected End Date:   07/08/21                    Follow Up Date 05/08/21   - call office if I gain more than 2 pounds in one day or 5 pounds in one week - use salt in moderation    Why is this important?    It is important to check your weight daily and watch how much salt and liquids you have.   It will help you to manage your heart failure.    Notes:  01/13/21 Patient continues to weigh daily and use salt in moderation. Patient knows action plan.         Plan: RN CM will follow up in July. Follow-up:  Patient agrees to Care Plan and Follow-up.

## 2021-01-18 ENCOUNTER — Ambulatory Visit: Payer: Self-pay

## 2021-01-18 DIAGNOSIS — I130 Hypertensive heart and chronic kidney disease with heart failure and stage 1 through stage 4 chronic kidney disease, or unspecified chronic kidney disease: Secondary | ICD-10-CM | POA: Diagnosis not present

## 2021-01-18 DIAGNOSIS — I739 Peripheral vascular disease, unspecified: Secondary | ICD-10-CM | POA: Diagnosis not present

## 2021-01-18 DIAGNOSIS — M810 Age-related osteoporosis without current pathological fracture: Secondary | ICD-10-CM | POA: Diagnosis not present

## 2021-01-18 DIAGNOSIS — E7800 Pure hypercholesterolemia, unspecified: Secondary | ICD-10-CM | POA: Diagnosis not present

## 2021-01-18 DIAGNOSIS — Z7901 Long term (current) use of anticoagulants: Secondary | ICD-10-CM | POA: Diagnosis not present

## 2021-01-18 DIAGNOSIS — I482 Chronic atrial fibrillation, unspecified: Secondary | ICD-10-CM | POA: Diagnosis not present

## 2021-01-18 DIAGNOSIS — D638 Anemia in other chronic diseases classified elsewhere: Secondary | ICD-10-CM | POA: Diagnosis not present

## 2021-01-18 DIAGNOSIS — I5032 Chronic diastolic (congestive) heart failure: Secondary | ICD-10-CM | POA: Diagnosis not present

## 2021-01-18 DIAGNOSIS — M109 Gout, unspecified: Secondary | ICD-10-CM | POA: Diagnosis not present

## 2021-01-18 DIAGNOSIS — D692 Other nonthrombocytopenic purpura: Secondary | ICD-10-CM | POA: Diagnosis not present

## 2021-01-18 DIAGNOSIS — E11319 Type 2 diabetes mellitus with unspecified diabetic retinopathy without macular edema: Secondary | ICD-10-CM | POA: Diagnosis not present

## 2021-01-18 DIAGNOSIS — D61818 Other pancytopenia: Secondary | ICD-10-CM | POA: Diagnosis not present

## 2021-01-18 DIAGNOSIS — N184 Chronic kidney disease, stage 4 (severe): Secondary | ICD-10-CM | POA: Diagnosis not present

## 2021-01-24 ENCOUNTER — Ambulatory Visit (HOSPITAL_BASED_OUTPATIENT_CLINIC_OR_DEPARTMENT_OTHER): Payer: Medicare HMO | Admitting: Obstetrics & Gynecology

## 2021-01-28 ENCOUNTER — Telehealth (HOSPITAL_COMMUNITY): Payer: Self-pay | Admitting: Cardiology

## 2021-01-28 NOTE — Telephone Encounter (Signed)
Called pt to get more information. No answer/left vm requesting return call.

## 2021-01-28 NOTE — Telephone Encounter (Signed)
Pt stated her heart rate has been in the low forty since last Saturday, first availabe appt scheduled 05/14 w/APP clinic, please advise

## 2021-01-31 ENCOUNTER — Other Ambulatory Visit: Payer: Self-pay

## 2021-01-31 ENCOUNTER — Inpatient Hospital Stay: Payer: Medicare HMO

## 2021-01-31 ENCOUNTER — Inpatient Hospital Stay: Payer: Medicare HMO | Attending: Oncology

## 2021-01-31 DIAGNOSIS — D508 Other iron deficiency anemias: Secondary | ICD-10-CM

## 2021-01-31 DIAGNOSIS — N189 Chronic kidney disease, unspecified: Secondary | ICD-10-CM | POA: Insufficient documentation

## 2021-01-31 DIAGNOSIS — D696 Thrombocytopenia, unspecified: Secondary | ICD-10-CM

## 2021-01-31 DIAGNOSIS — D631 Anemia in chronic kidney disease: Secondary | ICD-10-CM | POA: Insufficient documentation

## 2021-01-31 DIAGNOSIS — I739 Peripheral vascular disease, unspecified: Secondary | ICD-10-CM

## 2021-01-31 DIAGNOSIS — I776 Arteritis, unspecified: Secondary | ICD-10-CM | POA: Insufficient documentation

## 2021-01-31 DIAGNOSIS — D594 Other nonautoimmune hemolytic anemias: Secondary | ICD-10-CM

## 2021-01-31 LAB — CBC WITH DIFFERENTIAL/PLATELET
Abs Immature Granulocytes: 0.02 10*3/uL (ref 0.00–0.07)
Basophils Absolute: 0.1 10*3/uL (ref 0.0–0.1)
Basophils Relative: 1 %
Eosinophils Absolute: 0.2 10*3/uL (ref 0.0–0.5)
Eosinophils Relative: 4 %
HCT: 34.3 % — ABNORMAL LOW (ref 36.0–46.0)
Hemoglobin: 11.2 g/dL — ABNORMAL LOW (ref 12.0–15.0)
Immature Granulocytes: 0 %
Lymphocytes Relative: 16 %
Lymphs Abs: 1 10*3/uL (ref 0.7–4.0)
MCH: 31.4 pg (ref 26.0–34.0)
MCHC: 32.7 g/dL (ref 30.0–36.0)
MCV: 96.1 fL (ref 80.0–100.0)
Monocytes Absolute: 0.5 10*3/uL (ref 0.1–1.0)
Monocytes Relative: 8 %
Neutro Abs: 4.3 10*3/uL (ref 1.7–7.7)
Neutrophils Relative %: 71 %
Platelets: 146 10*3/uL — ABNORMAL LOW (ref 150–400)
RBC: 3.57 MIL/uL — ABNORMAL LOW (ref 3.87–5.11)
RDW: 14.4 % (ref 11.5–15.5)
WBC: 6 10*3/uL (ref 4.0–10.5)
nRBC: 0 % (ref 0.0–0.2)

## 2021-01-31 LAB — RETICULOCYTES
Immature Retic Fract: 4.9 % (ref 2.3–15.9)
RBC.: 3.51 MIL/uL — ABNORMAL LOW (ref 3.87–5.11)
Retic Count, Absolute: 45.6 10*3/uL (ref 19.0–186.0)
Retic Ct Pct: 1.3 % (ref 0.4–3.1)

## 2021-01-31 LAB — SAVE SMEAR(SSMR), FOR PROVIDER SLIDE REVIEW

## 2021-01-31 LAB — FERRITIN: Ferritin: 170 ng/mL (ref 11–307)

## 2021-01-31 NOTE — Progress Notes (Signed)
Pt here today for reticrit injection. HGB is 11.2 no injection needed at this time. Copy of labs given to patient.

## 2021-02-01 ENCOUNTER — Inpatient Hospital Stay: Payer: Medicare HMO

## 2021-02-03 ENCOUNTER — Ambulatory Visit (HOSPITAL_COMMUNITY): Payer: Medicare HMO | Attending: Cardiology

## 2021-02-03 ENCOUNTER — Other Ambulatory Visit: Payer: Self-pay

## 2021-02-03 DIAGNOSIS — I5033 Acute on chronic diastolic (congestive) heart failure: Secondary | ICD-10-CM | POA: Insufficient documentation

## 2021-02-03 DIAGNOSIS — Z952 Presence of prosthetic heart valve: Secondary | ICD-10-CM

## 2021-02-03 DIAGNOSIS — I739 Peripheral vascular disease, unspecified: Secondary | ICD-10-CM | POA: Diagnosis not present

## 2021-02-03 DIAGNOSIS — I480 Paroxysmal atrial fibrillation: Secondary | ICD-10-CM | POA: Diagnosis not present

## 2021-02-03 LAB — ECHOCARDIOGRAM COMPLETE
AR max vel: 0.99 cm2
AV Area VTI: 1.01 cm2
AV Area mean vel: 1 cm2
AV Mean grad: 18 mmHg
AV Peak grad: 32.3 mmHg
Ao pk vel: 2.84 m/s
Area-P 1/2: 1.72 cm2
MV M vel: 5.67 m/s
MV Peak grad: 128.6 mmHg
MV VTI: 1.33 cm2
Radius: 0.4 cm
S' Lateral: 3.1 cm

## 2021-02-11 ENCOUNTER — Other Ambulatory Visit: Payer: Self-pay

## 2021-02-11 ENCOUNTER — Ambulatory Visit (INDEPENDENT_AMBULATORY_CARE_PROVIDER_SITE_OTHER): Payer: Medicare HMO

## 2021-02-11 DIAGNOSIS — Z7901 Long term (current) use of anticoagulants: Secondary | ICD-10-CM

## 2021-02-11 DIAGNOSIS — Z952 Presence of prosthetic heart valve: Secondary | ICD-10-CM | POA: Diagnosis not present

## 2021-02-11 LAB — POCT INR: INR: 3 (ref 2.0–3.0)

## 2021-02-11 NOTE — Patient Instructions (Signed)
Description   Take 5mg  today, then resume same dosage of warfarin 7.5mg  daily. Recheck INR in 6 weeks.  Call [phone] if you need to change the appointment or if you have a procedure.

## 2021-02-25 DIAGNOSIS — H43821 Vitreomacular adhesion, right eye: Secondary | ICD-10-CM | POA: Diagnosis not present

## 2021-02-25 DIAGNOSIS — E113293 Type 2 diabetes mellitus with mild nonproliferative diabetic retinopathy without macular edema, bilateral: Secondary | ICD-10-CM | POA: Diagnosis not present

## 2021-03-01 ENCOUNTER — Other Ambulatory Visit: Payer: Self-pay

## 2021-03-01 ENCOUNTER — Encounter (HOSPITAL_COMMUNITY): Payer: Self-pay

## 2021-03-01 ENCOUNTER — Inpatient Hospital Stay: Payer: Medicare HMO

## 2021-03-01 ENCOUNTER — Ambulatory Visit (HOSPITAL_COMMUNITY)
Admission: RE | Admit: 2021-03-01 | Discharge: 2021-03-01 | Disposition: A | Discharge status: Home / Self Care | Payer: Medicare HMO | Source: Ambulatory Visit | Attending: Cardiology | Admitting: Cardiology

## 2021-03-01 ENCOUNTER — Inpatient Hospital Stay: Payer: Medicare HMO | Attending: Oncology

## 2021-03-01 VITALS — BP 156/52 | HR 51 | Wt 126.1 lb

## 2021-03-01 VITALS — BP 139/69 | HR 50 | Temp 97.8°F

## 2021-03-01 DIAGNOSIS — Z79899 Other long term (current) drug therapy: Secondary | ICD-10-CM | POA: Insufficient documentation

## 2021-03-01 DIAGNOSIS — E785 Hyperlipidemia, unspecified: Secondary | ICD-10-CM | POA: Diagnosis not present

## 2021-03-01 DIAGNOSIS — I776 Arteritis, unspecified: Secondary | ICD-10-CM | POA: Insufficient documentation

## 2021-03-01 DIAGNOSIS — Z951 Presence of aortocoronary bypass graft: Secondary | ICD-10-CM | POA: Insufficient documentation

## 2021-03-01 DIAGNOSIS — I130 Hypertensive heart and chronic kidney disease with heart failure and stage 1 through stage 4 chronic kidney disease, or unspecified chronic kidney disease: Secondary | ICD-10-CM | POA: Insufficient documentation

## 2021-03-01 DIAGNOSIS — Z7901 Long term (current) use of anticoagulants: Secondary | ICD-10-CM | POA: Insufficient documentation

## 2021-03-01 DIAGNOSIS — I2510 Atherosclerotic heart disease of native coronary artery without angina pectoris: Secondary | ICD-10-CM | POA: Insufficient documentation

## 2021-03-01 DIAGNOSIS — I272 Pulmonary hypertension, unspecified: Secondary | ICD-10-CM | POA: Insufficient documentation

## 2021-03-01 DIAGNOSIS — I440 Atrioventricular block, first degree: Secondary | ICD-10-CM | POA: Insufficient documentation

## 2021-03-01 DIAGNOSIS — I739 Peripheral vascular disease, unspecified: Secondary | ICD-10-CM

## 2021-03-01 DIAGNOSIS — Z952 Presence of prosthetic heart valve: Secondary | ICD-10-CM | POA: Diagnosis not present

## 2021-03-01 DIAGNOSIS — N183 Chronic kidney disease, stage 3 unspecified: Secondary | ICD-10-CM | POA: Diagnosis not present

## 2021-03-01 DIAGNOSIS — I480 Paroxysmal atrial fibrillation: Secondary | ICD-10-CM | POA: Insufficient documentation

## 2021-03-01 DIAGNOSIS — D631 Anemia in chronic kidney disease: Secondary | ICD-10-CM | POA: Insufficient documentation

## 2021-03-01 DIAGNOSIS — M1610 Unilateral primary osteoarthritis, unspecified hip: Secondary | ICD-10-CM | POA: Insufficient documentation

## 2021-03-01 DIAGNOSIS — M301 Polyarteritis with lung involvement [Churg-Strauss]: Secondary | ICD-10-CM | POA: Diagnosis not present

## 2021-03-01 DIAGNOSIS — I5032 Chronic diastolic (congestive) heart failure: Secondary | ICD-10-CM | POA: Diagnosis not present

## 2021-03-01 DIAGNOSIS — D500 Iron deficiency anemia secondary to blood loss (chronic): Secondary | ICD-10-CM

## 2021-03-01 DIAGNOSIS — N189 Chronic kidney disease, unspecified: Secondary | ICD-10-CM | POA: Diagnosis not present

## 2021-03-01 DIAGNOSIS — E1122 Type 2 diabetes mellitus with diabetic chronic kidney disease: Secondary | ICD-10-CM | POA: Diagnosis not present

## 2021-03-01 DIAGNOSIS — D696 Thrombocytopenia, unspecified: Secondary | ICD-10-CM

## 2021-03-01 DIAGNOSIS — D594 Other nonautoimmune hemolytic anemias: Secondary | ICD-10-CM

## 2021-03-01 DIAGNOSIS — D508 Other iron deficiency anemias: Secondary | ICD-10-CM

## 2021-03-01 LAB — CBC WITH DIFFERENTIAL/PLATELET
Abs Immature Granulocytes: 0.05 10*3/uL (ref 0.00–0.07)
Basophils Absolute: 0.1 10*3/uL (ref 0.0–0.1)
Basophils Relative: 1 %
Eosinophils Absolute: 0.3 10*3/uL (ref 0.0–0.5)
Eosinophils Relative: 4 %
HCT: 29.4 % — ABNORMAL LOW (ref 36.0–46.0)
Hemoglobin: 9.4 g/dL — ABNORMAL LOW (ref 12.0–15.0)
Immature Granulocytes: 1 %
Lymphocytes Relative: 16 %
Lymphs Abs: 1.1 10*3/uL (ref 0.7–4.0)
MCH: 30.6 pg (ref 26.0–34.0)
MCHC: 32 g/dL (ref 30.0–36.0)
MCV: 95.8 fL (ref 80.0–100.0)
Monocytes Absolute: 0.6 10*3/uL (ref 0.1–1.0)
Monocytes Relative: 9 %
Neutro Abs: 4.8 10*3/uL (ref 1.7–7.7)
Neutrophils Relative %: 69 %
Platelets: 141 10*3/uL — ABNORMAL LOW (ref 150–400)
RBC: 3.07 MIL/uL — ABNORMAL LOW (ref 3.87–5.11)
RDW: 14.6 % (ref 11.5–15.5)
WBC: 6.8 10*3/uL (ref 4.0–10.5)
nRBC: 0 % (ref 0.0–0.2)

## 2021-03-01 LAB — RETICULOCYTES
Immature Retic Fract: 7.2 % (ref 2.3–15.9)
RBC.: 2.95 MIL/uL — ABNORMAL LOW (ref 3.87–5.11)
Retic Count, Absolute: 45.1 10*3/uL (ref 19.0–186.0)
Retic Ct Pct: 1.5 % (ref 0.4–3.1)

## 2021-03-01 LAB — BASIC METABOLIC PANEL
Anion gap: 8 (ref 5–15)
BUN: 87 mg/dL — ABNORMAL HIGH (ref 8–23)
CO2: 21 mmol/L — ABNORMAL LOW (ref 22–32)
Calcium: 9.1 mg/dL (ref 8.9–10.3)
Chloride: 102 mmol/L (ref 98–111)
Creatinine, Ser: 2.19 mg/dL — ABNORMAL HIGH (ref 0.44–1.00)
GFR, Estimated: 22 mL/min — ABNORMAL LOW (ref >60)
Glucose, Bld: 313 mg/dL — ABNORMAL HIGH (ref 70–99)
Potassium: 4.7 mmol/L (ref 3.5–5.1)
Sodium: 131 mmol/L — ABNORMAL LOW (ref 135–145)

## 2021-03-01 LAB — SAVE SMEAR(SSMR), FOR PROVIDER SLIDE REVIEW

## 2021-03-01 LAB — FERRITIN: Ferritin: 347 ng/mL — ABNORMAL HIGH (ref 11–307)

## 2021-03-01 MED ORDER — EPOETIN ALFA-EPBX 10000 UNIT/ML IJ SOLN
20000.0000 [IU] | Freq: Once | INTRAMUSCULAR | Status: AC
  Administered 2021-03-01: 20000 [IU] via SUBCUTANEOUS

## 2021-03-01 MED ORDER — EPOETIN ALFA-EPBX 40000 UNIT/ML IJ SOLN
40000.0000 [IU] | Freq: Once | INTRAMUSCULAR | Status: AC
  Administered 2021-03-01: 40000 [IU] via SUBCUTANEOUS

## 2021-03-01 MED ORDER — EPOETIN ALFA-EPBX 40000 UNIT/ML IJ SOLN
INTRAMUSCULAR | Status: AC
  Filled 2021-03-01: qty 1

## 2021-03-01 MED ORDER — EPOETIN ALFA-EPBX 10000 UNIT/ML IJ SOLN
INTRAMUSCULAR | Status: AC
  Filled 2021-03-01: qty 2

## 2021-03-01 NOTE — Progress Notes (Signed)
Date:  03/01/2021   ID:  [name], DOB [birth year], MRN [id number]    Provider location: [place] [facility] Type of Visit: Established patient   PCP:  [name], MD  Cardiologist:  Quay Burow, MD Primary HF: Dr. Aundra Dubin  Chief Complaint: Shortness of breath   History of Present Illness: [name] is a 80 y.o. female who has a history of PAD, CAD s/p CABG, mechanical AVR, pulmonary hypertension, and chronic diastolic CHF with prominent RV dysfunction. Patient had CABG-AVR in 10/05.  She has been in chronic atrial fibrillation, it appears, since at least 4/17.  She has significant PAD followed by Dr. [name].    She was admitted in 5/19 with dyspnea and marked volume overloaded. She was started on Lasix and it was titrated up.  Echo showed EF 55-60% with moderate RV dilation, severe RV systolic dysfunction, and PASP 88 mmHg.  [place] in 9/19 showed moderate pulmonary hypertension with near normal filling pressures. PVR was not particularly high at 3.1 WU primarily due to elevated cardiac output. V/Q scan did not show chronic PEs and PFTs were unremarkable. She is on Adcirca.   She was admitted in 2/20 with upper GI bleeding, found to have gastric ulcers.  She had a CT chest in 3/20 with subtle bilateral ground glass opacities.  Referred to pulmonary.   She was admitted again in 6/20 with a GI bleed and required transfusion.   She was seen by pulmonary in 7/20, PFTs showed no obstruction/restriction but decreased DLCO.    She was seen by rheumatology due to concern for rheumatoid arthritis.  She was thought to have gout and OA but no RA.   Echo was done in 7/20 showing  EF 60-65% with mild LVH, normal RV, mechanical aortic valve with mean gradient 17 mmHg, PASP 34 mmHg.   In 8/20, she was admitted with CHF/pneumonitis and pancytopenia.  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  She has been  treated  with rituximab and prednisone, now just getting rituximab.   Echo 4/21 showed EF 65-70%, normal RV, moderate TR, mechanical aortic valve with mean gradient 20 mmHg, PASP 36 mmHg.   Had recent echo 4/22 w/ stable findings. LVEF 65-70% w/ GIIIDD, prior mechanical AVR stable w/ gradient similar to prior study (mean gradient 18 mmHg). RV normal w/ mildly elevated RVSP at 30.5  Today, she returns for her annual f/u. Also see's Dr. [name] on an annual basis. Reports doing fairly well. ReDs Clip normal at 30%. EKG shows sinus bradycardia, 51 bpm w/ 1st degree AVB. BP is mildly elevated at 156/52, but unfortunately maxed out on meds. Treatment limited by CKD. Home BP usually runs in the [id number] systolic. She reports full med compliance. Avoid excessive caffeine, monitors sodium intake.  Has chronic arthritic pain but takes Tylenol.     Labs (8/19): K 4.2, creatinine 0.8, LDL 56 Labs (9/19): K 4.3, Na 129, creatinine 0.87, hgb 9.8 Labs (10/19): ANA+, RF 36.8 (elevated), SCL-70 negative, anti-centromere Ab negative, ds-DNA negative.  Labs (12/19): K 4, creatinine 1.09 Labs (1/20): CCP Ab elevated 115 Labs (2/20): K 4.1, creatinine 1.24 Labs (7/20): K 4.2, creatinine 1.99, hgb 6.5 (transfused) Labs (11/20): K 3.5, creatinine 1.64 Labs (12/20): LDL 60 Labs (5/21): K 4.4, creatinine 1.85 => 2.66 Labs (03/30/20): Creatinine 1.7   6 min walk (10/19): 189 m 6 min walk (1/20): 183 m  ECG (personally reviewed): sinus bradycardia  51 bpm w/ 1st degree AVB   PMH: 1. Mechanical St Jude aortic valve 10/05: Goal INR 2.5-3.5.  2. CAD: s/p CABG x 2 with AVR in 10/05.  - Cardiolite in 2014 showed no ischemia.  3. HTN 4. Hyperlipidemia 5. Type 2 diabetes 6. PAD: Peripheral arterial dopplers (7/19) with >50% CIA stenosis on right, 70-99% CIA stenosis on the left.  7. H/o CCY 8. Atrial fibrillation: Chronic since at least 4/17. 9. Chronic diastolic CHF with prominent RV dysfunction: Echo (5/19) with EF  13-24%, grade 3 diastolic dysfunction, mechanical aortic valve with mean gradient 10 mmHg, mild MR, moderate RV dilation with moderate-severely decreased RV systolic function, biatrial enlargement, PASP 88 mmHg.  - Echo (7/20): EF 60-65% with mild LVH, normal RV, mechanical aortic valve with mean gradient 17 mmHg, PASP 34 mmHg.  - Echo (4/21): EF 65-70%, normal RV, moderate TR, mechanical aortic valve with mean gradient 20 mmHg, PASP 36 mmHg.  10. Carotid stenosis: 40-59% BICA stenosis on 7/19 carotid dopplers.  - Carotid dopplers (5/21): 40-59% BICA stenosis.  11. Pulmonary hypertension: RHC (9/19) with mean RA 5, PA 63/17 mean 36, mean PCWP 16, CI 4.16, PVR 3.1 WU.  - V/Q scan (10/19): no evidence for chronic PE.  - PFTs (10/19): minimal obstruction.  - ANA+, RF 36.8 (elevated), SCL-70 negative, anti-centromere Ab negative, ds-DNA negative, CCP Ab positive. She saw rheumatology, NOT thought to have RA.  - Echo in 7/20 with normal-appearing RV and PASP 34 mmHg.  - PFTs (7/20) with no significant obstruction/restriction but decreased DLCO.  - PYP scan not suggestive of TTR amyloidosis.  - Echo (4/21): EF 65-70%, normal RV, moderate TR, mechanical aortic valve with mean gradient 20 mmHg, PASP 36 mmHg.  12. 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 History of GI bleeding.  15. 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  - Getting Rituximab and prednisone.  16. CKD: stage 3.   Current Outpatient Medications  Medication Sig Dispense Refill  . acetaminophen (TYLENOL) 325 MG tablet Take 650 mg by mouth every 6 (six) hours as needed for mild pain.     [name] allopurinol (ZYLOPRIM) 100 MG tablet Take 100 mg by mouth daily.    [name] ALPRAZolam (XANAX) 0.5 MG tablet Take 0.25 mg by mouth at bedtime.    [name] amLODipine (NORVASC) 10 MG tablet Take 1 tablet (10 mg total) by mouth daily. 90 tablet 3  . atorvastatin (LIPITOR) 80 MG tablet Take 1 tablet (80 mg total)  by mouth daily at 6 PM.    . Cholecalciferol (VITAMIN D3) 1000 UNITS CAPS Take 2,000 Units by mouth daily with lunch.     . cloNIDine (CATAPRES) 0.2 MG tablet TAKE 1 TABLET TWICE DAILY 180 tablet 3  . colchicine 0.6 MG tablet     . Cyanocobalamin (VITAMIN B12) 1000 MCG TBCR Take 1,000 mcg by mouth daily with lunch.    . furosemide (LASIX) 80 MG tablet Take 1 tablet (80 mg total) by mouth daily. 90 tablet 3  . hydrALAZINE (APRESOLINE) 100 MG tablet TAKE 1 TABLET THREE TIMES DAILY 270 tablet 3  . lactose free nutrition (BOOST) LIQD Take 237 mLs by mouth daily. (To supplement meals)     . magnesium oxide (MAG-OX) 400 (241.3 Mg) MG tablet Take 1 tablet (400 mg total) by mouth daily.    . metoprolol succinate (TOPROL XL) 25 MG 24 hr tablet Take 0.5 tablets (12.5 mg total) by mouth daily. 45 tablet 3  . pantoprazole (PROTONIX) 40  MG tablet Take 40 mg by mouth 2 (two) times daily.     [name] spironolactone (ALDACTONE) 25 MG tablet TAKE 1 TABLET EVERY DAY 90 tablet 3  . tadalafil (CIALIS) 20 MG tablet Take 2 tablets (40 mg total) by mouth daily. For [place]. 60 tablet 3  . vitamin C (ASCORBIC ACID) 500 MG tablet Take 500 mg by mouth daily with lunch.    . warfarin (COUMADIN) 5 MG tablet TAKE 1 AND 1/2 TABLETS TO 2 TABLETS DAILY AS DIRECTED BY COUMADIN CLINIC 180 tablet 1   No current facility-administered medications for this encounter.    Allergies:   Flagyl [metronidazole], Coreg [carvedilol], Losartan, Verapamil, Zetia [ezetimibe], Zocor [simvastatin - high dose], and Gatifloxacin   Social History:  The patient  reports that she has quit smoking. Her smoking use included cigarettes. She has a 30.00 pack-year smoking history. She has never used smokeless tobacco. She reports that she does not drink alcohol and does not use drugs.   Family History:  [name] family history is not on file.   ROS:  Please see the history of present illness.   All other systems are personally reviewed and negative.    Exam:   BP (!) 156/52   Pulse (!) 51   Wt 57.2 kg (126 lb 2 oz)   SpO2 97%   BMI 24.63 kg/m    ReDS Vest / Clip - 03/01/21 1300      ReDS Vest / Clip   Station Marker B    Ruler Value 25    ReDS Value Range Low volume    ReDS Actual Value 30          General:  Well appearing elderly WF ambulated w/ cane. Kyphotic posture. No respiratory difficulty HEENT: normal Neck: supple. no JVD. Carotids 2+ bilat; no bruits. No lymphadenopathy or thyromegaly appreciated. Cor: PMI nondisplaced. Regular rhythm, slow rate. + crisp mechanical valve sounds  Lungs: clear Abdomen: soft, nontender, nondistended. No hepatosplenomegaly. No bruits or masses. Good bowel sounds. Extremities: no cyanosis, clubbing, rash, edema Neuro: alert & oriented x 3, cranial nerves grossly intact. moves all 4 extremities w/o difficulty. Affect pleasant.   Recent Labs: 03/30/2020: ALT 16; BUN 57; Creatinine 1.73; Potassium 4.4; Sodium 133 03/01/2021: Hemoglobin 9.4; Platelets 141  Personally reviewed   Wt Readings from Last 3 Encounters:  03/01/21 57.2 kg (126 lb 2 oz)  01/04/21 56.9 kg (125 lb 8 oz)  10/12/20 57.1 kg (125 lb 14.4 oz)     ASSESSMENT AND PLAN:  1. Atrial fibrillation: Paroxysmal.  - EKG today shows sinus brady, 51 bpm (asymptomatic w/ bradycardia)  - Continue  Toprol XL 12.5 mg daily.    - Continue warfarin, goal INR 2.5-3.5. No ASA with history of GI bleeding.  2. Chronic diastolic CHF: With prominent RV dysfunction and severe pulmonary hypertension by echo in 2019. Filling pressures were well-compensated on 9/19 RHC. Repeat echo in 7/20 showed normal RV size and systolic function and PA systolic pressure down to 34 mmHg.  PYP scan did not suggest ATTR amyloidosis.  Most recent echo 4/22 similar to prior studies, LVEF normal w/ GIIIDD. Volume status stable on exam. ReDs Clip 30%. NYHA Class III, chronic, and confounded by chronic hip arthritis.  - Continue Lasix 80 mg daily. - HR well  controlled w/  blocker - Check BMP today  3. CAD: denies CP  - Continue atorvastatin and  blocker. No ASA w/ chronic warfarin and h/o GIB  4. PAD: Significant on  7/19 dopplers but no claudication, no pedal ulcerations. Followed by Dr. [name].  5. Mechanical aortic valve: Stable prothesis on recent echo 4/22 w/ mean gradient 18 mmHg, c/w prior study.  Given co-existing atrial fibrillation, goal INR is 2.5-3.5.  - Continue warfarin.  - No ASA with GI bleeding 6. Pulmonary hypertension: Moderate pulmonary hypertension on 9/19 with RV dysfunction and dilation noted on last echo. PVR only 3.1 WU but cardiac output was high.  V/Q scan in 10/19 with no evidence for chronic PEs and PFTs in 10/19 and again in 7/20 were unremarkable except for low DLCO.  I think there is a possible group 1 PH component though this is a borderline call as PVR is not significantly elevated. ANA and RF positive, as is CCP Ab.  She saw rheumatology and per her report, was not thought to have rheumatoid arthritis.  Subsequently, she has been diagnosed with p-ANCA+ vasculitis. CT chest showed subtle bilateral ground glass opacities in 2020, she is followed by pulmonary.  Recent repeat echo in 4/22 showed normal RV and estimated PA systolic pressure 71.2 mmHg.  - Continue Adcirca 40 mg daily.  It was borderline to start this,  not sure she has had marked benefit.   Will hold off on starting a 2nd [place] medication for now.   7. Rheumatology: Joint pain, positive RF and CCP Ab.  Lung findings (ground glass, pulmonary hypertension) also thought to be concerning for RA. However, per patient's report, she was by her rheumatologist that did not have RA and did have gout.  More recently, she has been found to have p-ANCA+ vasculitis, eosinophilic granulomatosis with polyangiitis.  She is being treated with rituximab and prednisone by Dr Jana Hakim.   8. HTN: Mildly elevated on current antihypertensive regimen. Home SBPs average 140s-150s but  currently maxed out and limited by CKD (avoiding ACEi/ARBs).  - bradycardia limits further titration of  blocker and clonidine  - we discussed switch in  blocker from Toprol XL to Coreg for better BP control w/ its alpha affect, however she did not tolerate this in the past (see allergy list)  - she is already on max dose amlodipine and hydralazine - not a candidate for Imdur given Sildenafil for PH - encouraged sodium restriction  9. CKD: Stage 3.   - She was referred to nehrology, ?involvement by granulomatosis with polyangiitis.   - Last Scr 1.7  - Check BMP today    Follow up in 3 months with Dr Aundra Dubin.     Signed, Lyda Jester, PA-C  03/01/2021   [facility] [place] [address] [phone] (office) [phone] (fax)

## 2021-03-01 NOTE — Patient Instructions (Signed)
It was great to see you today! No medication changes are needed at this time.  Labs today We will only contact you if something comes back abnormal or we need to make some changes. Otherwise no news is good news!  Your physician recommends that you schedule a follow-up appointment in: 6 months with Dr Aundra Dubin  Do the following things EVERYDAY: 1) Weigh yourself in the morning before breakfast. Write it down and keep it in a log. 2) Take your medicines as prescribed 3) Eat low salt foods--Limit salt (sodium) to 2000 mg per day.  4) Stay as active as you can everyday 5) Limit all fluids for the day to less than 2 liters   At the [facility], you and your health needs are our priority. As part of our continuing mission to provide you with exceptional heart care, we have created designated Provider Care Teams. These Care Teams include your primary Cardiologist (physician) and Advanced Practice Providers (APPs- Physician Assistants and Nurse Practitioners) who all work together to provide you with the care you need, when you need it.   You may see any of the following providers on your designated Care Team at your next follow up: [name] Dr Glori Bickers . Dr Loralie Champagne . Dr Vickki Muff . [name], NP . Lyda Jester, [place] . Audry Riles, PharmD   Please be sure to bring in all your medications bottles to every appointment.   If you have any questions or concerns before your next appointment please send [place] a message through [place] or call our office at [phone].    TO LEAVE A MESSAGE FOR THE NURSE SELECT OPTION 2, PLEASE LEAVE A MESSAGE INCLUDING: . YOUR NAME . DATE OF BIRTH . CALL BACK NUMBER . REASON FOR CALL**this is important as we prioritize the call backs  YOU WILL RECEIVE A CALL BACK THE SAME DAY AS LONG AS YOU CALL BEFORE 4:00 PM

## 2021-03-01 NOTE — Progress Notes (Signed)
ReDS Vest / Clip - 03/01/21 1300      ReDS Vest / Clip   Station Marker B    Ruler Value 25    ReDS Value Range Low volume    ReDS Actual Value 30

## 2021-03-02 ENCOUNTER — Telehealth (HOSPITAL_COMMUNITY): Payer: Self-pay | Admitting: Cardiology

## 2021-03-02 DIAGNOSIS — I5032 Chronic diastolic (congestive) heart failure: Secondary | ICD-10-CM

## 2021-03-02 MED ORDER — FUROSEMIDE 40 MG PO TABS: ORAL_TABLET | ORAL | 3 refills | Status: DC

## 2021-03-02 NOTE — Telephone Encounter (Signed)
Pt aware and voiced understanding repeat labs 03/14/21

## 2021-03-02 NOTE — Telephone Encounter (Signed)
-----   Message from Consuelo Pandy, [place] sent at 03/01/2021  4:01 PM EDT ----- SCr/BUN elevated. Reduce Lasix, alternating between 60 and 80 mg daily. F/u BMP and BNP in 1 week

## 2021-03-08 DIAGNOSIS — I482 Chronic atrial fibrillation, unspecified: Secondary | ICD-10-CM | POA: Diagnosis not present

## 2021-03-08 DIAGNOSIS — I130 Hypertensive heart and chronic kidney disease with heart failure and stage 1 through stage 4 chronic kidney disease, or unspecified chronic kidney disease: Secondary | ICD-10-CM | POA: Diagnosis not present

## 2021-03-08 DIAGNOSIS — N184 Chronic kidney disease, stage 4 (severe): Secondary | ICD-10-CM | POA: Diagnosis not present

## 2021-03-08 DIAGNOSIS — I5032 Chronic diastolic (congestive) heart failure: Secondary | ICD-10-CM | POA: Diagnosis not present

## 2021-03-09 ENCOUNTER — Encounter (HOSPITAL_BASED_OUTPATIENT_CLINIC_OR_DEPARTMENT_OTHER): Payer: Self-pay | Admitting: Obstetrics & Gynecology

## 2021-03-09 ENCOUNTER — Ambulatory Visit (INDEPENDENT_AMBULATORY_CARE_PROVIDER_SITE_OTHER): Payer: Medicare HMO | Admitting: Obstetrics & Gynecology

## 2021-03-09 ENCOUNTER — Other Ambulatory Visit: Payer: Self-pay

## 2021-03-09 VITALS — BP 176/71 | HR 57 | Ht 59.75 in | Wt 126.0 lb

## 2021-03-09 DIAGNOSIS — Z7901 Long term (current) use of anticoagulants: Secondary | ICD-10-CM

## 2021-03-09 DIAGNOSIS — Z1231 Encounter for screening mammogram for malignant neoplasm of breast: Secondary | ICD-10-CM

## 2021-03-09 DIAGNOSIS — N8110 Cystocele, unspecified: Secondary | ICD-10-CM | POA: Diagnosis not present

## 2021-03-09 DIAGNOSIS — N819 Female genital prolapse, unspecified: Secondary | ICD-10-CM

## 2021-03-09 DIAGNOSIS — N184 Chronic kidney disease, stage 4 (severe): Secondary | ICD-10-CM | POA: Diagnosis not present

## 2021-03-09 DIAGNOSIS — Z952 Presence of prosthetic heart valve: Secondary | ICD-10-CM

## 2021-03-09 DIAGNOSIS — I480 Paroxysmal atrial fibrillation: Secondary | ICD-10-CM | POA: Diagnosis not present

## 2021-03-09 NOTE — Patient Instructions (Signed)
Colpocleisis Colpocleisis, also called colpectomy, is a surgery to partially or completely remove and stitch (suture) the vagina closed, including the opening. This procedure may be done to help with problems caused by the falling down (prolapse) of one or more pelvic organs. Prolapse could involve the vagina, uterus, bladder, rectum, or intestines. It is often caused by previous pregnancies and pelvic surgery, heavy straining and lifting for long periods of time, long-term, or chronic, constipation with straining, obesity, and aging. Colpocleisis may be done in women who:  Have stopped menstruating.  Have already had the uterus removed (had a hysterectomy).  No longer plan to have vaginal sex. After this surgery, your vagina will not be deep enough to permit vaginal intercourse.  Have medical problems that make more advanced surgery very risky. Tell a health care provider about:  Any allergies you have.  All medicines you are taking, including vitamins, herbs, eye drops, creams, and over-the-counter medicines.  Any problems you or family members have had with anesthetic medicines.  Any blood disorders you have.  Any surgeries you have had.  Any medical conditions you have.  Any colds or infections you have had recently. What are the risks? Generally, this is a safe procedure. However, problems may occur, including:  Infection.  Bleeding.  Allergic reaction to medicines.  Damage to nearby structures or organs.  Blood clots in the legs or lungs. What happens before the procedure? Staying hydrated Follow instructions from your health care provider about hydration, which may include:  Up to 2 hours before the procedure - you may continue to drink clear liquids, such as water, clear fruit juice, black coffee, and plain tea.   Eating and drinking restrictions Follow instructions from your health care provider about eating and drinking, which may include:  8 hours before the  procedure - stop eating heavy meals or foods, such as meat, fried foods, or fatty foods.  6 hours before the procedure - stop eating light meals or foods, such as toast or cereal.  6 hours before the procedure - stop drinking milk or drinks that contain milk.  2 hours before the procedure - stop drinking clear liquids. Medicines Ask your health care provider about:  Changing or stopping your regular medicines. This is especially important if you are taking diabetes medicines or blood thinners.  Taking medicines such as aspirin and ibuprofen. These medicines can thin your blood. Do not take these medicines unless your health care provider tells you to take them.  Taking over-the-counter medicines, vitamins, herbs, and supplements. General instructions  Do not use any products that contain nicotine or tobacco for at least 4 weeks before the procedure. These products include cigarettes, chewing tobacco, and vaping devices, such as e-cigarettes. If you need help quitting, ask your health care provider.  Ask your health care provider what steps will be taken to help prevent infection. These may include: ? Removing hair at the surgery site. ? Washing skin with a germ-killing soap. ? Receiving antibiotic medicine.  Plan to have a responsible adult take you home from the hospital or clinic.  Plan to have a responsible adult care for you for the time you are told after you leave the hospital or clinic. This is important. What happens during the procedure?  An IV will be inserted into one of your veins.  You will be given one or more of the following: ? A medicine to help you relax (sedative). ? A medicine to make you fall asleep (general anesthetic). ?  A medicine that is injected into your spine to numb the area below and slightly above the injection site (spinal anesthetic).  The prolapsed organs will be put back to their normal position. This will be done by hand through the vagina.  A  small incision will be made in the vagina, and the top and bottom of the vagina will be removed. No incision in the abdomen will be needed.  The opening of the vagina will be closed using sutures that dissolve in 1-2 months. The procedure may vary among health care providers and hospitals.   What happens after the procedure?  Your blood pressure, heart rate, breathing rate, and blood oxygen level will be monitored until you leave the hospital or clinic.  You will still have an IV in your vein. This will be removed before you go home.  You will also have a small, thin tube (catheter) in your bladder to keep your bladder empty. It will likely be removed the day after surgery. Your health care provider will talk with you about when this can be removed. They will give you any special instructions that you need.  You may be given an antibiotic to help prevent infection.  You may be given pain medicine.  Do not drive or operate machinery until your health care provider says that it is safe. Summary  Colpocleisis, or colpectomy, is a surgical procedure to partially or completely remove and stitch (suture) the vagina closed, including the opening.  This procedure may be done to help with problems caused by the falling down (prolapse) of one or more pelvic organs.  Before the procedure, ask your health care provider about changing or stopping your regular medicines.  After the procedure, your blood pressure, heart rate, breathing rate, and blood oxygen level will be monitored until you leave the hospital or clinic. This information is not intended to replace advice given to you by your health care provider. Make sure you discuss any questions you have with your health care provider. Document Revised: 07/27/2020 Document Reviewed: 05/08/2020 Elsevier Patient Education  [place].

## 2021-03-09 NOTE — Progress Notes (Signed)
80 y.o. No obstetric history on file. Widowed White or Caucasian female for new patient exam.  Pt has hx of vaginal vault prolapse.  Has seen Dr. Matilde Sprang.  He referred her for consideration of pessary placement.  She has hx of TAH/BSO in 1989 due to bleedign and fibroid issues.  Pt did have five vaginal deliveries.  Largest child was her last one and was 9 1/2 pounds.  Does leak a little at times with movement.  She does leak with cough or sneeze if bladder is full.  She did have an anterior repair in 2009.  Cannot recall provider's names.  Denies vaginal bleeding.  Does wear a pad.    Does have hx of diabetes and was on metformin.  Had a lot of issues with diarrhea but it turns out this was related to metformin.  Diarrhea issues are much better.  Currently off all medications for diabetes.  No LMP recorded. Patient has had a hysterectomy.          Sexually active: No.  H/O STD:  no   reports that she has quit smoking. Her smoking use included cigarettes. She has a 30.00 pack-year smoking history. She has never used smokeless tobacco. She reports that she does not drink alcohol and does not use drugs.  Past Medical History:  Diagnosis Date  . Aortic atherosclerosis ([place]) 01/23/2018  . Atrial fibrillation ([place])   . Carotid artery disease ([place])   . Cholelithiasis 01/23/2018  . Chronic anticoagulation   . Coronary artery disease    status post coronary artery bypass grafting times 07/10/2004  . Diabetes mellitus   . Hypercholesteremia   . Hypertension   . Mechanical heart valve present    H. aortic valve replacement at the time of bypass surgery October 2005  . Moderate to severe pulmonary hypertension ([place])   . Peripheral arterial disease ([facility])    history of left common iliac artery PTA and stenting for a chronic total occlusion 08/26/01    Past Surgical History:  Procedure Laterality Date  . anterior repair  2009  . AORTIC VALVE REPLACEMENT  2005   Dr. Cyndia Bent  . CARDIAC  CATHETERIZATION  11/10/2004   40% right common illiac, 70% in stent restenosis of distal left common illiac,   . CARDIAC CATHETERIZATION  05/18/2004   LAD 50-70% midstenosis, RCA dominant w/50% stenosis, 50% Right common Illiac artery ostial stenosis, 90% in stent restenosis within midportion of left common illiac stent  . Carotid Duplex  03/12/2012   RSA-elev. velocities suggestive of a 50-69% diameter reduction, Right&Left Bulb/Prox ICA-mild-mod.fibrous plaqueelevating Velocities abnormal study.  . CHOLECYSTECTOMY N/A 03/01/2018   Procedure: LAPAROSCOPIC CHOLECYSTECTOMY;  Surgeon: Judeth Horn, MD;  Location: [facility];  Service: General;  Laterality: N/A;  . COLONOSCOPY WITH PROPOFOL N/A 01/22/2018   Procedure: COLONOSCOPY WITH PROPOFOL;  Surgeon: Wilford Corner, MD;  Location: [facility];  Service: Endoscopy;  Laterality: N/A;  . ESOPHAGOGASTRODUODENOSCOPY (EGD) WITH PROPOFOL N/A 01/22/2018   Procedure: ESOPHAGOGASTRODUODENOSCOPY (EGD) WITH PROPOFOL;  Surgeon: Wilford Corner, MD;  Location: [facility];  Service: Endoscopy;  Laterality: N/A;  . ESOPHAGOGASTRODUODENOSCOPY (EGD) WITH PROPOFOL N/A 11/21/2018   Procedure: ESOPHAGOGASTRODUODENOSCOPY (EGD) WITH PROPOFOL;  Surgeon: [name], MD;  Location: [facility];  Service: Gastroenterology;  Laterality: N/A;  . ESOPHAGOGASTRODUODENOSCOPY (EGD) WITH PROPOFOL Left 02/27/2019   Procedure: ESOPHAGOGASTRODUODENOSCOPY (EGD) WITH PROPOFOL;  Surgeon: Arta Silence, MD;  Location: [facility];  Service: Endoscopy;  Laterality: Left;  . GIVENS CAPSULE STUDY N/A 11/21/2018   Procedure: GIVENS  CAPSULE STUDY;  Surgeon: [name], MD;  Location: [facility];  Service: Gastroenterology;  Laterality: N/A;  To be deployed during EGD  . Lower Ext. Duplex  03/12/2012   Right Proximal CIA- vessel narrowing w/elevated velocities 0-49% diameter reduction. Right SFA-mild mixed density plaque throughout vessel.  [name] NM MYOCAR PERF WALL MOTION  05/19/2010    protocol: Persantine, post stress EF 65%, negative for ischemia, low risk scan  . RIGHT HEART CATH N/A 06/27/2018   Procedure: RIGHT HEART CATH;  Surgeon: [name], MD;  Location: [facility];  Service: Cardiovascular;  Laterality: N/A;  . TOTAL ABDOMINAL HYSTERECTOMY W/ BILATERAL SALPINGOOPHORECTOMY  1989  . TRANSTHORACIC ECHOCARDIOGRAM  08/29/2012   Moderately calcified annulus of mitral valve, moderate regurg. of both mitral valve and tricuspid valve.     Current Outpatient Medications  Medication Sig Dispense Refill  . acetaminophen (TYLENOL) 325 MG tablet Take 650 mg by mouth every 6 (six) hours as needed for mild pain.     [name] allopurinol (ZYLOPRIM) 100 MG tablet Take 100 mg by mouth daily.    [name] ALPRAZolam (XANAX) 0.5 MG tablet Take 0.25 mg by mouth at bedtime.    [name] amLODipine (NORVASC) 10 MG tablet Take 1 tablet (10 mg total) by mouth daily. 90 tablet 3  . atorvastatin (LIPITOR) 80 MG tablet Take 1 tablet (80 mg total) by mouth daily at 6 PM.    . Cholecalciferol (VITAMIN D3) 1000 UNITS CAPS Take 2,000 Units by mouth daily with lunch.     . cloNIDine (CATAPRES) 0.2 MG tablet TAKE 1 TABLET TWICE DAILY 180 tablet 3  . Cyanocobalamin (VITAMIN B12) 1000 MCG TBCR Take 1,000 mcg by mouth daily with lunch.    . furosemide (LASIX) 40 MG tablet Take 2 tablets (80 mg total) by mouth in the morning AND 1.5 tablets (60 mg total) every evening. 120 tablet 3  . hydrALAZINE (APRESOLINE) 100 MG tablet TAKE 1 TABLET THREE TIMES DAILY 270 tablet 3  . lactose free nutrition (BOOST) LIQD Take 237 mLs by mouth daily. (To supplement meals)     . magnesium oxide (MAG-OX) 400 (241.3 Mg) MG tablet Take 1 tablet (400 mg total) by mouth daily.    . metoprolol succinate (TOPROL XL) 25 MG 24 hr tablet Take 0.5 tablets (12.5 mg total) by mouth daily. 45 tablet 3  . pantoprazole (PROTONIX) 40 MG tablet Take 40 mg by mouth 2 (two) times daily.     [name] spironolactone (ALDACTONE) 25 MG tablet TAKE 1 TABLET  EVERY DAY 90 tablet 3  . tadalafil (CIALIS) 20 MG tablet Take 2 tablets (40 mg total) by mouth daily. For [place]. 60 tablet 3  . vitamin C (ASCORBIC ACID) 500 MG tablet Take 500 mg by mouth daily with lunch.    . warfarin (COUMADIN) 5 MG tablet TAKE 1 AND 1/2 TABLETS TO 2 TABLETS DAILY AS DIRECTED BY COUMADIN CLINIC 180 tablet 1   No current facility-administered medications for this visit.    Family History  Problem Relation Age of Onset  . Breast cancer Neg Hx     Review of Systems  Constitutional: Negative.   Genitourinary: Negative for pelvic pain, vaginal bleeding and vaginal discharge.       Mild stress incontinence    Exam:   BP (!) 176/71   Pulse (!) 57   Ht 4' 11.75" (1.518 m)   Wt 126 lb (57.2 kg)   BMI 24.81 kg/m   Height: 4' 11.75" (151.8 cm)  General appearance: alert, cooperative and appears stated age Lymph nodes: Cervical, supraclavicular, and axillary nodes normal.  No abnormal inguinal nodes palpated Neurologic: Grossly normal  Pelvic: External genitalia:  no lesions              Urethra:  normal appearing urethra with no masses, tenderness or lesions              Bartholins and Skenes: normal                 Vagina: normal appearing vagina with atrophic changes and thickened whitish area on vaginal mucosa, 4th degree cystocele with vault prolapse               Cervix: absent              Pap taken: No. Bimanual Exam:  Uterus:  uterus absent              Adnexa: no mass, fullness, tenderness  D/w pt treatment options including pessary use and possibly a colpocleisis.  Would need cardiology help to ensure adequate surgical candidate.  Pt aware I do not do these procedures.  Pt willing to try pessary fitting.  #3 incontinence ring with support placed.  This was too small.  Then #4 placed.  Again too small.  #5 placed.  Fit was good but pt was able to expel with valsalva in bathroom.  #6 is too large.  Spotting present with removal of last pessary.  After  fitting, pt reports she has tried in past but they always came out.  Did not mention this earlier.    Chaperone, Octaviano Batty, CMA, was present for exam.  Assessment/Plan: 1. Female cystocele - failed pessary placement.  Pt does not want to try again for fitting.  Will refer to urogyn for consideration of colpocleisis   2. Encounter for screening mammogram for malignant neoplasm of breast - d/w pt has been several years since last mammogram.  States she would want to treat a breast cancer if had, so feel appropriate to proceed with mammogram - MM 3D SCREEN BREAST BILATERAL; Future  3. Vaginal vault prolapse with lesion on vaginal mucosa.  - will check with cardiology about continuing vaginal estrogen cream.  If ok, recheck 6 weeks and if still present, will need to biopsy.  4. CKD (chronic kidney disease), stage IV ([place])  5. PAF (paroxysmal atrial fibrillation) ([place]) -advised to stop vaginal estrogen cream until I reach out to her cardiologist, Dr. [name], for input.  6. Chronic anticoagulation  7. H/O mechanical aortic valve replacement  Total time with pt:  49 minutes

## 2021-03-11 ENCOUNTER — Other Ambulatory Visit: Payer: Self-pay

## 2021-03-11 ENCOUNTER — Encounter: Payer: Self-pay | Admitting: Podiatry

## 2021-03-11 ENCOUNTER — Ambulatory Visit (INDEPENDENT_AMBULATORY_CARE_PROVIDER_SITE_OTHER): Payer: Medicare HMO | Admitting: Podiatry

## 2021-03-11 DIAGNOSIS — M79675 Pain in left toe(s): Secondary | ICD-10-CM

## 2021-03-11 DIAGNOSIS — E1151 Type 2 diabetes mellitus with diabetic peripheral angiopathy without gangrene: Secondary | ICD-10-CM | POA: Diagnosis not present

## 2021-03-11 DIAGNOSIS — B351 Tinea unguium: Secondary | ICD-10-CM

## 2021-03-11 DIAGNOSIS — M79674 Pain in right toe(s): Secondary | ICD-10-CM | POA: Diagnosis not present

## 2021-03-14 ENCOUNTER — Ambulatory Visit (HOSPITAL_COMMUNITY)
Admission: RE | Admit: 2021-03-14 | Discharge: 2021-03-14 | Disposition: A | Discharge status: Home / Self Care | Payer: Medicare HMO | Source: Ambulatory Visit | Attending: Cardiology | Admitting: Cardiology

## 2021-03-14 ENCOUNTER — Telehealth (HOSPITAL_BASED_OUTPATIENT_CLINIC_OR_DEPARTMENT_OTHER): Payer: Self-pay | Admitting: *Deleted

## 2021-03-14 ENCOUNTER — Other Ambulatory Visit: Payer: Self-pay

## 2021-03-14 DIAGNOSIS — I5032 Chronic diastolic (congestive) heart failure: Secondary | ICD-10-CM | POA: Insufficient documentation

## 2021-03-14 LAB — BASIC METABOLIC PANEL
Anion gap: 10 (ref 5–15)
BUN: 83 mg/dL — ABNORMAL HIGH (ref 8–23)
CO2: 21 mmol/L — ABNORMAL LOW (ref 22–32)
Calcium: 8.9 mg/dL (ref 8.9–10.3)
Chloride: 102 mmol/L (ref 98–111)
Creatinine, Ser: 2.18 mg/dL — ABNORMAL HIGH (ref 0.44–1.00)
GFR, Estimated: 22 mL/min — ABNORMAL LOW (ref >60)
Glucose, Bld: 301 mg/dL — ABNORMAL HIGH (ref 70–99)
Potassium: 4.2 mmol/L (ref 3.5–5.1)
Sodium: 133 mmol/L — ABNORMAL LOW (ref 135–145)

## 2021-03-14 NOTE — Telephone Encounter (Signed)
-----   Message from Megan Salon, MD sent at 03/11/2021  3:15 PM EDT ----- Regarding: vaginal estrogen cream Can you call pt and let her know that it is ok to continue the vaginal estrogen cream.  Dr. [name] said it was ok.  I did also put in a referral for Dr. Wannetta Sender for consideration of colpocleisis.  Pt is to come back and see me in about six weeks.  She has a thickened white area on the vagina that I think may be from rubbbing in her underwear but I want to recheck.  I may need to biopsy it is still present at the follow up visit.  Might want to add to the appt note for that day, possible vaginal biopsy. Thanks.  Vinnie [place]

## 2021-03-14 NOTE — Telephone Encounter (Signed)
Called pt to let her know, per Dr. Sabra Heck, that it is ok for her to continue the vaginal estrogen cream. Dr [name] said it was ok as well. Advised that a referral to see Dr. Wannetta Sender had been placed for consideration of colpocleisis. Advised that Dr. Sabra Heck would like to see pt for 6 week follow up visit. Pt already has an appt. Advised to call back for any other questions or concerns.

## 2021-03-17 ENCOUNTER — Other Ambulatory Visit: Payer: Self-pay | Admitting: Cardiovascular Disease

## 2021-03-18 NOTE — Progress Notes (Signed)
  Subjective:  Patient ID: [name], female    DOB: [birth year],  MRN: [id number]  80 y.o. female presents with at risk foot care. Pt has h/o NIDDM with PAD and painful thick toenails that are difficult to trim. Pain interferes with ambulation. Aggravating factors include wearing enclosed shoe gear. Pain is relieved with periodic professional debridement..    Patient's blood sugar was 126 mg/dl today.  PCP: [name], MD and last visit was: 02/11/2021.  Review of Systems: Negative except as noted in the HPI.   Allergies  Allergen Reactions   Flagyl [Metronidazole] Rash    ALL-OVER BODY RASH   Coreg [Carvedilol] Other (See Comments)    Terrible cramping in the feet and had a lot of bowel movements, but not diarrhea   Losartan Swelling    Patient doesn't recall site of swelling   Verapamil Hives   Zetia [Ezetimibe] Other (See Comments)    Reaction not recalled   Zocor [Simvastatin - High Dose] Other (See Comments)    Reaction not recalled   Gatifloxacin Rash    Redness to skin around eye    Objective:  There were no vitals filed for this visit. Constitutional Patient is a pleasant 80 y.o. Caucasian female WD, WN in NAD. AAO x 3.  Vascular Capillary fill time to digits <3 seconds b/l lower extremities. Faintly palpable pedal pulses b/l. Pedal hair absent. Lower extremity skin temperature gradient within normal limits. Trace edema noted b/l lower extremities. No ischemia or gangrene noted b/l lower extremities. No cyanosis or clubbing noted.  Neurologic Normal speech. Protective sensation intact 5/5 intact bilaterally with 10g monofilament b/l. Vibratory sensation intact b/l. Proprioception intact bilaterally.  Dermatologic Pedal skin is thin shiny, atrophic b/l feet. No open wounds bilaterally. No interdigital macerations bilaterally. Toenails 1-5 b/l elongated, discolored, dystrophic, thickened, crumbly with subungual debris and tenderness to dorsal palpation.   Orthopedic: Normal muscle strength 5/5 to all lower extremity muscle groups bilaterally. No pain crepitus or joint limitation noted with ROM b/l. No gross bony deformities bilaterally.   Assessment:   1. Pain due to onychomycosis of toenails of both feet   2. Type II diabetes mellitus with peripheral circulatory disorder [facility])    Plan:  Patient was evaluated and treated and all questions answered.  Onychomycosis with pain -Nails palliatively debridement as below. -Educated on self-care  Procedure: Nail Debridement Rationale: Pain Type of Debridement: manual, sharp debridement. Instrumentation: Nail nipper, rotary burr. Number of Nails: 10  -Examined patient. -Continue diabetic foot care principles. -Patient to continue soft, supportive shoe gear daily. -Toenails 1-5 b/l were debrided in length and girth with sterile nail nippers and dremel without iatrogenic bleeding.  -Patient to report any pedal injuries to medical professional immediately. -Patient/POA to call should there be question/concern in the interim.  Return in about 3 months (around 06/11/2021).  Marzetta Board, DPM

## 2021-03-25 ENCOUNTER — Ambulatory Visit (INDEPENDENT_AMBULATORY_CARE_PROVIDER_SITE_OTHER): Payer: Medicare HMO | Admitting: Pharmacist

## 2021-03-25 ENCOUNTER — Other Ambulatory Visit: Payer: Self-pay

## 2021-03-25 DIAGNOSIS — Z952 Presence of prosthetic heart valve: Secondary | ICD-10-CM | POA: Diagnosis not present

## 2021-03-25 DIAGNOSIS — Z7901 Long term (current) use of anticoagulants: Secondary | ICD-10-CM

## 2021-03-25 LAB — POCT INR: INR: 3 (ref 2.0–3.0)

## 2021-03-25 NOTE — Patient Instructions (Signed)
Description   Continue taking 1.5 tablets (7.5mg ) daily. Recheck INR in 6 weeks.  Call [phone] if you need to change the appointment or if you have a procedure.

## 2021-03-28 NOTE — Progress Notes (Signed)
[facility]  [phone] [phone]    ID: [name] DOB: Jan 21, 1941  MR#: [id number]  NID#:[id number]  Patient Care Team: [name], MD as PCP - General (Internal Medicine) [name], MD as PCP - Advanced Heart Failure (Cardiology) Lorretta Harp, MD as PCP - Cardiology (Cardiology) Marjie Chea, [name], MD as Consulting Physician (Hematology and Oncology) Arta Silence, MD as Consulting Physician (Gastroenterology) [name], MD as Consulting Physician (Cardiology) Marzetta Board, DPM as Consulting Physician (Podiatry) Gavin Pound, MD as Consulting Physician (Rheumatology) Jon Billings, RN as [facility] Corliss Parish, MD as Consulting Physician (Nephrology) OTHER MD:   CHIEF COMPLAINT: anemia of renal failure; vasculitis  CURRENT TREATMENT: Retacrit, rituximab   INTERVAL HISTORY: [name] returns today for follow-up and treatment of her anemia of renal failure and vasculitis.  She was driven here by her neighbor.  She receives 60K Retacrit every 4 weeks.  This had brought her hemoglobin above 10, but it is currently back in the 9 range.  She has not experienced a significant change in terms of her functional status but likely will of course if the hemoglobin falls further.  She is now receiving rituximab every 12 weeks.  She tolerates these treatments remarkably well, with no side effects from the rituximab that she is aware of.   We are following her hemoglobin at present: Lab Results  Component Value Date   HGB 9.0 (L) 03/29/2021   HGB 9.4 (L) 03/01/2021   HGB 11.2 (L) 01/31/2021   HGB 10.3 (L) 01/04/2021   HGB 10.1 (L) 12/06/2020   She is scheduled for screening mammography on 05/13/2021.   REVIEW OF SYSTEMS: Jakai tells me she was taken off metformin which was causing her severe diarrhea and she is feeling a lot better since going off that medication.  She  has also gained a little bit of weight.  She continues to do her housework including painting her bathroom and she made some dresses.  A detailed review of systems today was otherwise stable.   COVID 19 VACCINATION STATUS: Has received [place] x2  with booster December 2021     HISTORY OF CURRENT ILLNESS: From the original consult note:  [name] is an 80 y.o. female from [place], [place].  She has a past medical history significant for PAD s/p stenting; moderate to severe pulmonary HTN; afib and AVR on Coumadin; HTN; HLD; DM; and CAD s/p CABG. the patient was admitted to the hospital for anemia.  She was having increasing fatigue and shortness of breath and had routine lab work drawn which showed a hemoglobin of 6.6.  Due to her abnormal lab work, she was advised to come to the emergency room for admission.  She was also admitted in February 2020 for symptomatic anemia.  She reports she has been anemic for at least a year and has been taking oral iron.  She underwent a colonoscopy and EGD on 01/22/2018.  The colonoscopy showed diverticulosis in the sigmoid colon and descending colon, internal hemorrhoids.  The upper endoscopy was normal except for erythematous mucosa in the antrum.  She had a repeat EGD on 11/21/2018 that showed a few minimally oozing( with scope trauma) superficial gastric ulcers with pigmented material were found in the gastric body. The largest lesion was 4 mm in largest dimension.  She underwent another upper endoscopy on 02/27/2019 which showed patchy mildly friable mucosa with contact bleeding was found in the gastric  fundus, in the gastric body and in the gastric antrum.  The patient is maintained on warfarin due to A. fib and a mechanical aortic valve.  Review of the patient's chart shows that she has been anemic dating back to at least December thousand 12 (these are the earliest labs available to me).  Her highest hemoglobin was 11.6 in December 2012.  I also note that  the patient has had intermittent mild leukopenia adding back to at least April 2019.  Her lowest white blood cell count was 2.6 in April 2019.   Since admission, the patient has received 1 unit of packed red blood cells.  She is currently on heparin for her procedure with plan to bridge back to Coumadin.  Stool for occult blood was negative x1 on 02/24/2019.  Hematology was asked see the patient to make recommendations regarding her anemia.  [name] subsequent history is as detailed below.   PAST MEDICAL HISTORY: Past Medical History:  Diagnosis Date   Aortic atherosclerosis ([facility]) 01/23/2018   Atrial fibrillation (H[facility])    Carotid artery disease ([facility])    Cholelithiasis 01/23/2018   Chronic anticoagulation    Coronary artery disease    status post coronary artery bypass grafting times 07/10/2004   Diabetes mellitus    Hypercholesteremia    Hypertension    Mechanical heart valve present    H. aortic valve replacement at the time of bypass surgery October 2005   Moderate to severe pulmonary hypertension ([facility])    Peripheral arterial disease ([facility])    history of left common iliac artery PTA and stenting for a chronic total occlusion 08/26/01    PAST SURGICAL HISTORY: Past Surgical History:  Procedure Laterality Date   anterior repair  2009   AORTIC VALVE REPLACEMENT  2005   Dr. Cyndia Bent   CARDIAC CATHETERIZATION  11/10/2004   40% right common illiac, 70% in stent restenosis of distal left common illiac,    CARDIAC CATHETERIZATION  05/18/2004   LAD 50-70% midstenosis, RCA dominant w/50% stenosis, 50% Right common Illiac artery ostial stenosis, 90% in stent restenosis within midportion of left common illiac stent   Carotid Duplex  03/12/2012   RSA-elev. velocities suggestive of a 50-69% diameter reduction, Right&Left Bulb/Prox ICA-mild-mod.fibrous plaqueelevating Velocities abnormal study.   CHOLECYSTECTOMY N/A 03/01/2018   Procedure: LAPAROSCOPIC CHOLECYSTECTOMY;  Surgeon: Judeth Horn, MD;  Location: [facility];  Service: General;  Laterality: N/A;   COLONOSCOPY WITH PROPOFOL N/A 01/22/2018   Procedure: COLONOSCOPY WITH PROPOFOL;  Surgeon: Wilford Corner, MD;  Location: [facility];  Service: Endoscopy;  Laterality: N/A;   ESOPHAGOGASTRODUODENOSCOPY (EGD) WITH PROPOFOL N/A 01/22/2018   Procedure: ESOPHAGOGASTRODUODENOSCOPY (EGD) WITH PROPOFOL;  Surgeon: Wilford Corner, MD;  Location: [facility];  Service: Endoscopy;  Laterality: N/A;   ESOPHAGOGASTRODUODENOSCOPY (EGD) WITH PROPOFOL N/A 11/21/2018   Procedure: ESOPHAGOGASTRODUODENOSCOPY (EGD) WITH PROPOFOL;  Surgeon: [name], MD;  Location: [facility];  Service: Gastroenterology;  Laterality: N/A;   ESOPHAGOGASTRODUODENOSCOPY (EGD) WITH PROPOFOL Left 02/27/2019   Procedure: ESOPHAGOGASTRODUODENOSCOPY (EGD) WITH PROPOFOL;  Surgeon: Arta Silence, MD;  Location[facility];  Service: Endoscopy;  Laterality: Left;   GIVENS CAPSULE STUDY N/A 11/21/2018   Procedure: GIVENS CAPSULE STUDY;  Surgeon: [name], MD;  Location: [facility];  Service: Gastroenterology;  Laterality: N/A;  To be deployed during EGD   Lower Ext. Duplex  03/12/2012   Right Proximal CIA- vessel narrowing w/elevated velocities 0-49% diameter reduction. Right SFA-mild mixed density plaque throughout vessel.   NM MYOCAR PERF WALL MOTION  05/19/2010   protocol: Persantine, post stress EF 65%, negative for ischemia, low risk scan   RIGHT HEART CATH N/A 06/27/2018   Procedure: RIGHT HEART CATH;  Surgeon: [name], MD;  Location: [facility];  Service: Cardiovascular;  Laterality: N/A;   TOTAL ABDOMINAL HYSTERECTOMY W/ BILATERAL SALPINGOOPHORECTOMY  1989   TRANSTHORACIC ECHOCARDIOGRAM  08/29/2012   Moderately calcified annulus of mitral valve, moderate regurg. of both mitral valve and tricuspid valve.     FAMILY HISTORY: Family History  Problem Relation Age of Onset   Breast cancer Neg Hx     GYNECOLOGIC HISTORY:  No LMP  recorded. Patient has had a hysterectomy. Menarche:  years old Age at first live birth:  [place] P: 1 LMP:  Contraceptive:  HRT:   Hysterectomy?: yes BSO?:    SOCIAL HISTORY: (Current as of 03/07/2019) Mckynlee is widowed.  She lives by herself.  She has 3 children that live locally.  She smoked three quarters of a pack of cigarettes daily for 50 years.  She quit in 2005.  Denies alcohol use.   ADVANCED DIRECTIVES: Not in place    HEALTH MAINTENANCE: Social History   Tobacco Use   Smoking status: Former    Packs/day: 1.00    Years: 30.00    Pack years: 30.00    Types: Cigarettes   Smokeless tobacco: Never   Tobacco comments:    quit smoking 2005  Vaping Use   Vaping Use: Never used  Substance Use Topics   Alcohol use: No    Alcohol/week: 0.0 standard drinks   Drug use: No     Colonoscopy: EGD on 02/27/2019 under Dr. Paulita Fujita found some friable gastric mucosa.  Colonoscopy under Dr. Michail Sermon 01/22/2018 showed significant diverticular disease  PAP:   Bone density: May 2017 showed osteopenia   Allergies  Allergen Reactions   Flagyl [Metronidazole] Rash    ALL-OVER BODY RASH   Coreg [Carvedilol] Other (See Comments)    Terrible cramping in the feet and had a lot of bowel movements, but not diarrhea   Losartan Swelling    Patient doesn't recall site of swelling   Verapamil Hives   Zetia [Ezetimibe] Other (See Comments)    Reaction not recalled   Zocor [Simvastatin - High Dose] Other (See Comments)    Reaction not recalled   Gatifloxacin Rash    Redness to skin around eye    Current Outpatient Medications  Medication Sig Dispense Refill   acetaminophen (TYLENOL) 325 MG tablet Take 650 mg by mouth every 6 (six) hours as needed for mild pain.      allopurinol (ZYLOPRIM) 100 MG tablet Take 100 mg by mouth daily.     ALPRAZolam (XANAX) 0.5 MG tablet Take 0.25 mg by mouth at bedtime.     amLODipine (NORVASC) 10 MG tablet Take 1 tablet (10 mg total) by mouth daily. 90  tablet 3   atorvastatin (LIPITOR) 80 MG tablet Take 1 tablet (80 mg total) by mouth daily at 6 PM.     Cholecalciferol (VITAMIN D3) 1000 UNITS CAPS Take 2,000 Units by mouth daily with lunch.      cloNIDine (CATAPRES) 0.2 MG tablet TAKE 1 TABLET TWICE DAILY 180 tablet 3   Cyanocobalamin (VITAMIN B12) 1000 MCG TBCR Take 1,000 mcg by mouth daily with lunch.     furosemide (LASIX) 40 MG tablet Take 2 tablets (80 mg total) by mouth in the morning AND 1.5 tablets (60 mg total) every evening. 120 tablet 3  hydrALAZINE (APRESOLINE) 100 MG tablet TAKE 1 TABLET THREE TIMES DAILY 270 tablet 3   lactose free nutrition (BOOST) LIQD Take 237 mLs by mouth daily. (To supplement meals)      magnesium oxide (MAG-OX) 400 (241.3 Mg) MG tablet Take 1 tablet (400 mg total) by mouth daily.     metoprolol succinate (TOPROL XL) 25 MG 24 hr tablet Take 0.5 tablets (12.5 mg total) by mouth daily. 45 tablet 3   pantoprazole (PROTONIX) 40 MG tablet Take 40 mg by mouth 2 (two) times daily.      spironolactone (ALDACTONE) 25 MG tablet TAKE 1 TABLET EVERY DAY 90 tablet 3   tadalafil (CIALIS) 20 MG tablet Take 2 tablets (40 mg total) by mouth daily. For [place]. 60 tablet 3   vitamin C (ASCORBIC ACID) 500 MG tablet Take 500 mg by mouth daily with lunch.     warfarin (COUMADIN) 5 MG tablet TAKE 1 AND 1/2 TABLETS TO 2 TABLETS DAILY AS DIRECTED BY COUMADIN CLINIC 180 tablet 0   No current facility-administered medications for this visit.    OBJECTIVE: White woman in no acute distress Vitals:   03/29/21 1150  BP: (!) 148/63  Pulse: (!) 58  Resp: 18  Temp: 97.7 F (36.5 C)  SpO2: 99%   Wt Readings from Last 3 Encounters:  03/29/21 129 lb 11.2 oz (58.8 kg)  03/09/21 126 lb (57.2 kg)  03/01/21 126 lb 2 oz (57.2 kg)   Body mass index is 26.2 kg/m.    ECOG FS:1 - Symptomatic but completely ambulatory  Sclerae unicteric, EOMs intact Wearing a mask Lungs no rales or rhonchi Heart regular rate and rhythm Abd soft,  nontender, positive bowel sounds MSK no focal spinal tenderness, no upper extremity lymphedema Neuro: nonfocal, well oriented, appropriate affect Breasts: Deferred   LAB RESULTS:  CMP     Component Value Date/Time   NA 133 (L) 03/14/2021 0933   NA 137 12/06/2017 1436   K 4.2 03/14/2021 0933   CL 102 03/14/2021 0933   CO2 21 (L) 03/14/2021 0933   GLUCOSE 301 (H) 03/14/2021 0933   BUN 83 (H) 03/14/2021 0933   BUN 20 12/06/2017 1436   CREATININE 2.18 (H) 03/14/2021 0933   CREATININE 1.73 (H) 03/30/2020 0932   CALCIUM 8.9 03/14/2021 0933   PROT 6.3 (L) 03/30/2020 0932   ALBUMIN 3.6 03/30/2020 0932   AST 21 03/30/2020 0932   ALT 16 03/30/2020 0932   ALKPHOS 83 03/30/2020 0932   BILITOT 0.4 03/30/2020 0932   GFRNONAA 22 (L) 03/14/2021 0933   GFRNONAA 27 (L) 03/30/2020 0932   GFRAA 32 (L) 03/30/2020 0932    Lab Results  Component Value Date   TOTALPROTELP 6.9 05/07/2019    No results found for: KPAFRELGTCHN, LAMBDASER, KAPLAMBRATIO  Lab Results  Component Value Date   WBC 5.5 03/29/2021   NEUTROABS 3.8 03/29/2021   HGB 9.0 (L) 03/29/2021   HCT 27.2 (L) 03/29/2021   MCV 93.8 03/29/2021   PLT 140 (L) 03/29/2021    No results found for: LABCA2  No components found for: DPOEUM353  Recent Labs  Lab 03/25/21 1102  INR 3.0    No results found for: LABCA2  No results found for: IRW431  No results found for: VQM086  No results found for: PYP950  No results found for: CA2729  No components found for: HGQUANT  No results found for: CEA1 / No results found for: CEA1   No results found for: AFPTUMOR  No results  found for: CHROMOGRNA  No results found for: HGBA, HGBA2QUANT, HGBFQUANT, HGBSQUAN (Hemoglobinopathy evaluation)   Lab Results  Component Value Date   LDH 212 (H) 08/04/2019    Lab Results  Component Value Date   IRON 123 03/30/2020   TIBC 268 03/30/2020   IRONPCTSAT 46 03/30/2020   (Iron and TIBC)  Lab Results  Component Value Date    FERRITIN 285 03/29/2021    Urinalysis    Component Value Date/Time   COLORURINE YELLOW 07/07/2019 0209   APPEARANCEUR CLOUDY (A) 07/07/2019 0209   LABSPEC 1.011 07/07/2019 0209   PHURINE 6.0 07/07/2019 0209   GLUCOSEU NEGATIVE 07/07/2019 0209   HGBUR NEGATIVE 07/07/2019 0209   BILIRUBINUR NEGATIVE 07/07/2019 0209   KETONESUR NEGATIVE 07/07/2019 0209   PROTEINUR >=300 (A) 07/07/2019 0209   UROBILINOGEN 1.0 10/06/2011 1709   NITRITE NEGATIVE 07/07/2019 0209   LEUKOCYTESUR LARGE (A) 07/07/2019 0209    STUDIES:  No results found.   ELIGIBLE FOR AVAILABLE RESEARCH PROTOCOL: no   ASSESSMENT: 80 y.o. [place] woman with multifactorial anemia, as follows:             (a) anemia of chronic inflammation: As of May 2020 she has a SED rate of 95, CRP 1.1, positive ANA and RF; subsequently she was found to be ANCA positive             (b) hemolysis (mild): she has a mildly elevated LDH and a DAT positive for complement, not IgG; this is c/w (a) above             (c) anemia of renal insufficiency: inadequate EPO resonse to anemia              (d) GIB/ iron deficiency in patient on lifelong anticoagulaton  (1) Feraheme received 03/01/2019, repeated 03/20/2019   (a) reticulocyte 03/07/2019 up from 43.4 a year ago to 191.8 after iron infusion  (b) feraheme repeated on 05/12/2019  (c) subsequent ferritin levels >100  (2) darbepoetin: starting 05/05/2019 at 194mcg given every 2 weeks  (a) dose increased to 200 mcg every week starting 07/01/2019   (b) back to every 2 weeks beginning 08/04/2019 still at 200 mcg dose  (c) switched to Retacrit every 4 weeks as of 08/18/2019  (3) ANCA positive vasculitis: diagnosed 05/08/2019 (titer 1:640)  (a) prednisone 40 mg daily started 05/31/2019  (b) rituximab weekly started 06/12/2019 (received test dose 06/05/2019)   (i) hepatitis B sAg, core Ab and HIV negative 05/08/2019   (ii) rituximab held after 07/14/2019 dose (received 5 weekly doses) with  neutropenia   (iii) rituximab resumed 08/18/2019 but changed to monthly   (iv) rituximab changed to every 2 months beginning with 02/02/2020 dose   (v) rituximab changed to every 12 weeks after the 10/12/2020 dose  (c) prednisone tapered to off as of 09/15/2019  (4) osteopenia with a T score of -1.5 on bone density 02/28/2016 at the breast center    PLAN: Maanvi is generally doing well as far as her vasculitis is concerned and she has no overt symptoms related to it at this point.  She does have significant anemia and we are continuing the Retacrit on a monthly basis.  We are also continuing the rituximab every 3 months as before.  Lab work today shows a ferritin over 200 so iron deficiency is not related to her decreased response to the Retacrit.  We will continue to follow her counts monthly but at this point I am not anticipating any changes in  her treatment.  She will return to see me in 3 months.  She knows to call for any other issue that may develop before then  Total encounter time 25 minutes.Sarajane Jews C. Bethanee Redondo, MD 03/29/21 5:38 PM Medical Oncology and Hematology [facility] [place], [place] [address] Tel. [phone]    Fax. [phone]   I, Wilburn Mylar, am acting as scribe for Dr. [name]. Joshia Kitchings.  I, Lurline Del MD, have reviewed the above documentation for accuracy and completeness, and I agree with the above.   *Total Encounter Time as defined by the Centers for Medicare and Medicaid Services includes, in addition to the face-to-face time of a patient visit (documented in the note above) non-face-to-face time: obtaining and reviewing outside history, ordering and reviewing medications, tests or procedures, care coordination (communications with other health care professionals or caregivers) and documentation in the medical record.

## 2021-03-29 ENCOUNTER — Other Ambulatory Visit: Payer: Self-pay

## 2021-03-29 ENCOUNTER — Inpatient Hospital Stay (HOSPITAL_BASED_OUTPATIENT_CLINIC_OR_DEPARTMENT_OTHER): Payer: Medicare HMO | Admitting: Oncology

## 2021-03-29 ENCOUNTER — Inpatient Hospital Stay: Payer: Medicare HMO | Attending: Oncology

## 2021-03-29 VITALS — BP 191/75 | HR 47 | Resp 18

## 2021-03-29 VITALS — BP 148/63 | HR 58 | Temp 97.7°F | Resp 18 | Ht 59.0 in | Wt 129.7 lb

## 2021-03-29 DIAGNOSIS — M8580 Other specified disorders of bone density and structure, unspecified site: Secondary | ICD-10-CM | POA: Diagnosis not present

## 2021-03-29 DIAGNOSIS — I7782 Antineutrophilic cytoplasmic antibody (ANCA) vasculitis: Secondary | ICD-10-CM

## 2021-03-29 DIAGNOSIS — N289 Disorder of kidney and ureter, unspecified: Secondary | ICD-10-CM

## 2021-03-29 DIAGNOSIS — D500 Iron deficiency anemia secondary to blood loss (chronic): Secondary | ICD-10-CM

## 2021-03-29 DIAGNOSIS — I776 Arteritis, unspecified: Secondary | ICD-10-CM

## 2021-03-29 DIAGNOSIS — D696 Thrombocytopenia, unspecified: Secondary | ICD-10-CM

## 2021-03-29 DIAGNOSIS — N189 Chronic kidney disease, unspecified: Secondary | ICD-10-CM | POA: Insufficient documentation

## 2021-03-29 DIAGNOSIS — Z5112 Encounter for antineoplastic immunotherapy: Secondary | ICD-10-CM | POA: Insufficient documentation

## 2021-03-29 DIAGNOSIS — D631 Anemia in chronic kidney disease: Secondary | ICD-10-CM | POA: Diagnosis not present

## 2021-03-29 DIAGNOSIS — D594 Other nonautoimmune hemolytic anemias: Secondary | ICD-10-CM

## 2021-03-29 DIAGNOSIS — I739 Peripheral vascular disease, unspecified: Secondary | ICD-10-CM

## 2021-03-29 DIAGNOSIS — D508 Other iron deficiency anemias: Secondary | ICD-10-CM

## 2021-03-29 DIAGNOSIS — D61818 Other pancytopenia: Secondary | ICD-10-CM

## 2021-03-29 LAB — CBC WITH DIFFERENTIAL/PLATELET
Abs Immature Granulocytes: 0.07 10*3/uL (ref 0.00–0.07)
Basophils Absolute: 0.1 10*3/uL (ref 0.0–0.1)
Basophils Relative: 1 %
Eosinophils Absolute: 0.2 10*3/uL (ref 0.0–0.5)
Eosinophils Relative: 4 %
HCT: 27.2 % — ABNORMAL LOW (ref 36.0–46.0)
Hemoglobin: 9 g/dL — ABNORMAL LOW (ref 12.0–15.0)
Immature Granulocytes: 1 %
Lymphocytes Relative: 17 %
Lymphs Abs: 0.9 10*3/uL (ref 0.7–4.0)
MCH: 31 pg (ref 26.0–34.0)
MCHC: 33.1 g/dL (ref 30.0–36.0)
MCV: 93.8 fL (ref 80.0–100.0)
Monocytes Absolute: 0.5 10*3/uL (ref 0.1–1.0)
Monocytes Relative: 9 %
Neutro Abs: 3.8 10*3/uL (ref 1.7–7.7)
Neutrophils Relative %: 68 %
Platelets: 140 10*3/uL — ABNORMAL LOW (ref 150–400)
RBC: 2.9 MIL/uL — ABNORMAL LOW (ref 3.87–5.11)
RDW: 15.3 % (ref 11.5–15.5)
WBC: 5.5 10*3/uL (ref 4.0–10.5)
nRBC: 0 % (ref 0.0–0.2)

## 2021-03-29 LAB — RETICULOCYTES
Immature Retic Fract: 8.8 % (ref 2.3–15.9)
RBC.: 2.89 MIL/uL — ABNORMAL LOW (ref 3.87–5.11)
Retic Count, Absolute: 56.4 10*3/uL (ref 19.0–186.0)
Retic Ct Pct: 2 % (ref 0.4–3.1)

## 2021-03-29 LAB — FERRITIN: Ferritin: 285 ng/mL (ref 11–307)

## 2021-03-29 LAB — SAVE SMEAR(SSMR), FOR PROVIDER SLIDE REVIEW

## 2021-03-29 MED ORDER — EPOETIN ALFA-EPBX 40000 UNIT/ML IJ SOLN
INTRAMUSCULAR | Status: AC
  Filled 2021-03-29: qty 1

## 2021-03-29 MED ORDER — SODIUM CHLORIDE 0.9 % IV SOLN
375.0000 mg/m2 | Freq: Once | INTRAVENOUS | Status: AC
  Administered 2021-03-29: 600 mg via INTRAVENOUS
  Filled 2021-03-29: qty 50

## 2021-03-29 MED ORDER — ACETAMINOPHEN 325 MG PO TABS
650.0000 mg | ORAL_TABLET | Freq: Once | ORAL | Status: AC
  Administered 2021-03-29: 650 mg via ORAL

## 2021-03-29 MED ORDER — DIPHENHYDRAMINE HCL 25 MG PO CAPS
25.0000 mg | ORAL_CAPSULE | Freq: Once | ORAL | Status: AC
  Administered 2021-03-29: 25 mg via ORAL

## 2021-03-29 MED ORDER — DIPHENHYDRAMINE HCL 25 MG PO CAPS
ORAL_CAPSULE | ORAL | Status: AC
  Filled 2021-03-29: qty 1

## 2021-03-29 MED ORDER — FUROSEMIDE 20 MG PO TABS
20.0000 mg | ORAL_TABLET | Freq: Once | ORAL | Status: AC
Start: 2021-03-29 — End: 2021-03-29
  Administered 2021-03-29: 20 mg via ORAL

## 2021-03-29 MED ORDER — EPOETIN ALFA-EPBX 20000 UNIT/ML IJ SOLN
20000.0000 [IU] | Freq: Once | INTRAMUSCULAR | Status: AC
  Administered 2021-03-29: 20000 [IU] via SUBCUTANEOUS
  Filled 2021-03-29: qty 1

## 2021-03-29 MED ORDER — ACETAMINOPHEN 325 MG PO TABS
ORAL_TABLET | ORAL | Status: AC
  Filled 2021-03-29: qty 2

## 2021-03-29 MED ORDER — EPOETIN ALFA-EPBX 40000 UNIT/ML IJ SOLN
40000.0000 [IU] | Freq: Once | INTRAMUSCULAR | Status: AC
  Administered 2021-03-29: 40000 [IU] via SUBCUTANEOUS

## 2021-03-29 MED ORDER — SODIUM CHLORIDE 0.9 % IV SOLN
Freq: Once | INTRAVENOUS | Status: AC
  Filled 2021-03-29: qty 250

## 2021-03-29 MED ORDER — EPOETIN ALFA-EPBX 40000 UNIT/ML IJ SOLN: 20000.0000 [IU] | Freq: Once | INTRAMUSCULAR | Status: DC

## 2021-03-29 MED ORDER — FUROSEMIDE 20 MG PO TABS
ORAL_TABLET | ORAL | Status: AC
  Filled 2021-03-29: qty 1

## 2021-03-29 NOTE — Progress Notes (Signed)
Dr. Jana Hakim notified of elevated BP. He ordered Lasix 20 mg po.. patient can be discharge home

## 2021-03-29 NOTE — Patient Instructions (Signed)
[facility]COL[facility]  Discharge Instructions: Thank you for choosing [facility] to provide your oncology and hematology care.   If you have a lab appointment with the Cancer Center, please go directly to the Cancer Center and check in at the registration area.   Wear comfortable clothing and clothing appropriate for easy access to any Portacath or PICC line.   We strive to give you quality time with your provider. You may need to reschedule your appointment if you arrive late (15 or more minutes).  Arriving late affects you and other patients whose appointments are after yours.  Also, if you miss three or more appointments without notifying the office, you may be dismissed from the clinic at the provider's discretion.      For prescription refill requests, have your pharmacy contact our office and allow 72 hours for refills to be completed.    Today you received the following chemotherapy and/or immunotherapy agents Rituxan      To help prevent nausea and vomiting after your treatment, we encourage you to take your nausea medication as directed.  BELOW ARE SYMPTOMS THAT SHOULD BE REPORTED IMMEDIATELY: *FEVER GREATER THAN 100.4 F (38 C) OR HIGHER *CHILLS OR SWEATING *NAUSEA AND VOMITING THAT IS NOT CONTROLLED WITH YOUR NAUSEA MEDICATION *UNUSUAL SHORTNESS OF BREATH *UNUSUAL BRUISING OR BLEEDING *URINARY PROBLEMS (pain or burning when urinating, or frequent urination) *BOWEL PROBLEMS (unusual diarrhea, constipation, pain near the anus) TENDERNESS IN MOUTH AND THROAT WITH OR WITHOUT PRESENCE OF ULCERS (sore throat, sores in mouth, or a toothache) UNUSUAL RASH, SWELLING OR PAIN  UNUSUAL VAGINAL DISCHARGE OR ITCHING   Items with * indicate a potential emergency and should be followed up as soon as possible or go to the Emergency Department if any problems should occur.  Please show the CHEMOTHERAPY ALERT CARD or IMMUNOTHERAPY ALERT CARD at check-in to the  Emergency Department and triage nurse.  Should you have questions after your visit or need to cancel or reschedule your appointment, please contact [facility]  Dept: [phone]  and follow the prompts.  Office hours are 8:00 a.m. to 4:30 p.m. Monday - Friday. Please note that voicemails left after 4:00 p.m. may not be returned until the following business day.  We are closed weekends and major holidays. You have access to a nurse at all times for urgent questions. Please call the main number to the clinic Dept: [phone] and follow the prompts.   For any non-urgent questions, you may also contact your provider using MyChart. We now offer e-Visits for anyone 18 and older to request care online for non-urgent symptoms. For details visit [email or web address].   Also download the MyChart app! Go to the app store, search "MyChart", open the app, select [facility], and log in with your MyChart username and password.  Due to Covid, a mask is required upon entering the hospital/clinic. If you do not have a mask, one will be given to you upon arrival. For doctor visits, patients may have 1 support person aged 18 or older with them. For treatment visits, patients cannot have anyone with them due to current Covid guidelines and our immunocompromised population.   

## 2021-03-30 ENCOUNTER — Telehealth: Payer: Self-pay | Admitting: Oncology

## 2021-03-30 NOTE — Telephone Encounter (Signed)
Scheduled appointment per 06/21 los. Patient is aware. 

## 2021-04-01 ENCOUNTER — Encounter (HOSPITAL_COMMUNITY): Payer: Medicare HMO

## 2021-04-07 DIAGNOSIS — I5032 Chronic diastolic (congestive) heart failure: Secondary | ICD-10-CM | POA: Diagnosis not present

## 2021-04-07 DIAGNOSIS — I130 Hypertensive heart and chronic kidney disease with heart failure and stage 1 through stage 4 chronic kidney disease, or unspecified chronic kidney disease: Secondary | ICD-10-CM | POA: Diagnosis not present

## 2021-04-07 DIAGNOSIS — I480 Paroxysmal atrial fibrillation: Secondary | ICD-10-CM | POA: Diagnosis not present

## 2021-04-13 ENCOUNTER — Other Ambulatory Visit: Payer: Self-pay

## 2021-04-13 NOTE — Patient Outreach (Signed)
[facility]) Care Management  [place]  04/13/2021   [name] 21-Jan-1941 [id number]  Subjective: Telephone call to patient for disease management follow up. Patient reports she is doing ok. Weight 129 lbs, no longer on Metformin which was causing diarrhea. Explained this maybe the reason for weight gain.  Discussed heart failure management.    Objective:   Encounter Medications:  Outpatient Encounter Medications as of 04/13/2021  Medication Sig   acetaminophen (TYLENOL) 325 MG tablet Take 650 mg by mouth every 6 (six) hours as needed for mild pain.    allopurinol (ZYLOPRIM) 100 MG tablet Take 100 mg by mouth daily.   ALPRAZolam (XANAX) 0.5 MG tablet Take 0.25 mg by mouth at bedtime.   amLODipine (NORVASC) 10 MG tablet Take 1 tablet (10 mg total) by mouth daily.   atorvastatin (LIPITOR) 80 MG tablet Take 1 tablet (80 mg total) by mouth daily at 6 PM.   Cholecalciferol (VITAMIN D3) 1000 UNITS CAPS Take 2,000 Units by mouth daily with lunch.    cloNIDine (CATAPRES) 0.2 MG tablet TAKE 1 TABLET TWICE DAILY   Cyanocobalamin (VITAMIN B12) 1000 MCG TBCR Take 1,000 mcg by mouth daily with lunch.   furosemide (LASIX) 40 MG tablet Take 2 tablets (80 mg total) by mouth in the morning AND 1.5 tablets (60 mg total) every evening.   hydrALAZINE (APRESOLINE) 100 MG tablet TAKE 1 TABLET THREE TIMES DAILY   lactose free nutrition (BOOST) LIQD Take 237 mLs by mouth daily. (To supplement meals)    magnesium oxide (MAG-OX) 400 (241.3 Mg) MG tablet Take 1 tablet (400 mg total) by mouth daily.   metoprolol succinate (TOPROL XL) 25 MG 24 hr tablet Take 0.5 tablets (12.5 mg total) by mouth daily.   pantoprazole (PROTONIX) 40 MG tablet Take 40 mg by mouth 2 (two) times daily.    spironolactone (ALDACTONE) 25 MG tablet TAKE 1 TABLET EVERY DAY   tadalafil (CIALIS) 20 MG tablet Take 2 tablets (40 mg total) by mouth daily. For [place].   vitamin C (ASCORBIC ACID) 500 MG tablet Take 500 mg  by mouth daily with lunch.   warfarin (COUMADIN) 5 MG tablet TAKE 1 AND 1/2 TABLETS TO 2 TABLETS DAILY AS DIRECTED BY COUMADIN CLINIC   No facility-administered encounter medications on file as of 04/13/2021.    Functional Status:  In your present state of health, do you have any difficulty performing the following activities: 01/13/2021  Hearing? N  Vision? N  Difficulty concentrating or making decisions? N  Walking or climbing stairs? Y  Comment weakness  Dressing or bathing? N  Doing errands, shopping? Y  Comment son and neighbor assists as needed  Conservation officer, nature and eating ? N  Using the Toilet? N  In the past six months, have you accidently leaked urine? Y  Comment ocassional  Do you have problems with loss of bowel control? N  Managing your Medications? N  Managing your Finances? N  Housekeeping or managing your Housekeeping? Y  Comment family assists as needed  Some recent data might be hidden    Fall/Depression Screening: Fall Risk  01/13/2021 03/24/2020 01/26/2020  Falls in the past year? 0 0 0  Comment - - -  Number falls in past yr: - - -  Injury with Fall? - - -  Risk for fall due to : - - -  Risk for fall due to: Comment - - -  Follow up - - -   [facility] 2/9  Scores 01/13/2021 01/26/2020 11/25/2019 07/31/2019 05/12/2019 03/24/2019 07/09/2018  PHQ - 2 Score 0 1 1 2 3 1  0  PHQ- 9 Score - - - 9 9 - -    Assessment:   Care Plan Care Plan : Heart Failure (Adult)  Updates made by Jon Billings, RN since 04/13/2021 12:00 AM     Problem: Symptom Exacerbation (Heart Failure)      Long-Range Goal: Symptom Exacerbation Prevented or Minimized as evidenced by patient having no acute heart failure   Start Date: 01/13/2021  Expected End Date: 10/08/2021  This Visit's Progress: On track  Priority: High  Note:   Evidence-based guidance:  Perform or review cognitive and/or health literacy screening.  Assess understanding of adherence and barriers to treatment plan, as well as lifestyle  changes; develop strategies to address barriers.  Establish a mutually-agreed-upon early intervention process to communicate with primary care provider when signs/symptoms worsen.   Notes:  01/13/21 Patient continues to follow heart failure regimen and follow up with physicians as scheduled.      Task: Identify and Minimize Risk of Heart Failure Exacerbation   Due Date: 07/08/2021  Priority: Routine  Responsible User: Jon Billings, RN  Note:   Care Management Activities:    - healthy lifestyle promoted - rescue (action) plan reviewed    Notes: 04/13/21 Patient reports doing ok. Weight up to 129 lbs.   Heart Failure Management Discussed Please weight daily or as ordered by your doctor Report to your doctor weight gain of 2 -3 pounds in a day or 5 pounds in a week Limit salt intake Monitor for shortness of breath, swelling of feet, ankles or abdomen and weight gain. Never use the saltshaker.  Read all food labels and avoid canned, processed, and pickled foods.   Follow your doctor's recommendations for daily salt intake      Goals Addressed             This Visit's Progress    COMPLETED: Make and Keep All Appointments   On track    Timeframe:  Long-Range Goal Priority:  High Start Date:    01/13/21                         Expected End Date:   07/08/21                    Follow Up Date 05/08/21   - ask family or friend for a ride    Why is this important?   Part of staying healthy is seeing the doctor for follow-up care.  If you forget your appointments, there are some things you can do to stay on track.    Notes: 01/13/21 Discussed transportation and reminded of [place] if assistance needed with transportation.        Track and Manage Fluids and Swelling-Heart Failure   On track    Barriers: Health Behaviors Knowledge Timeframe:  Long-Range Goal Priority:  High Start Date:         01/13/21                    Expected End Date:   10/08/21  Follow Up Date  08/08/21  - call office if I gain more than 2 pounds in one day or 5 pounds in one week - keep legs up while sitting - track weight in diary - use salt in moderation    Why is this important?  It is important to check your weight daily and watch how much salt and liquids you have.  It will help you to manage your heart failure.    Notes:  01/13/21 Patient continues to weigh daily and use salt in moderation. Patient knows action plan.   04/13/21 Patient weight 129 lbs.  Discussed heart failure management and notifying physician for any changes.  Heart Failure Management Discussed Please weight daily or as ordered by your doctor Report to your doctor weight gain of 2 -3 pounds in a day or 5 pounds in a week Limit salt intake Monitor for shortness of breath, swelling of feet, ankles or abdomen and weight gain. Never use the saltshaker.  Read all food labels and avoid canned, processed, and pickled foods.   Follow your doctor's recommendations for daily salt intake         Plan:  Follow-up: Patient agrees to Care Plan and Follow-up. Follow-up in 3 month(s)  [name], RN, MSN [place] [facility] Care Management Coordinator Direct Line [phone] Cell [phone] Toll Free: [phone]  Fax: [phone]

## 2021-04-13 NOTE — Patient Instructions (Signed)
Goals Addressed             This Visit's Progress    COMPLETED: Make and Keep All Appointments   On track    Timeframe:  Long-Range Goal Priority:  High Start Date:    01/13/21                         Expected End Date:   07/08/21                    Follow Up Date 05/08/21   - ask family or friend for a ride    Why is this important?   Part of staying healthy is seeing the doctor for follow-up care.  If you forget your appointments, there are some things you can do to stay on track.    Notes: 01/13/21 Discussed transportation and reminded of [place] if assistance needed with transportation.        Track and Manage Fluids and Swelling-Heart Failure   On track    Barriers: Health Behaviors Knowledge Timeframe:  Long-Range Goal Priority:  High Start Date:         01/13/21                    Expected End Date:   10/08/21  Follow Up Date 08/08/21  - call office if I gain more than 2 pounds in one day or 5 pounds in one week - keep legs up while sitting - track weight in diary - use salt in moderation    Why is this important?   It is important to check your weight daily and watch how much salt and liquids you have.  It will help you to manage your heart failure.    Notes:  01/13/21 Patient continues to weigh daily and use salt in moderation. Patient knows action plan.   04/13/21 Patient weight 129 lbs.  Discussed heart failure management and notifying physician for any changes.  Heart Failure Management Discussed Please weight daily or as ordered by your doctor Report to your doctor weight gain of 2 -3 pounds in a day or 5 pounds in a week Limit salt intake Monitor for shortness of breath, swelling of feet, ankles or abdomen and weight gain. Never use the saltshaker.  Read all food labels and avoid canned, processed, and pickled foods.   Follow your doctor's recommendations for daily salt intake

## 2021-04-14 ENCOUNTER — Ambulatory Visit: Payer: Self-pay

## 2021-04-14 ENCOUNTER — Other Ambulatory Visit (HOSPITAL_COMMUNITY): Payer: Self-pay | Admitting: Cardiovascular Disease

## 2021-04-14 DIAGNOSIS — Z95828 Presence of other vascular implants and grafts: Secondary | ICD-10-CM

## 2021-04-19 ENCOUNTER — Other Ambulatory Visit (HOSPITAL_COMMUNITY)
Admission: RE | Admit: 2021-04-19 | Discharge: 2021-04-19 | Disposition: A | Discharge status: Home / Self Care | Payer: Medicare HMO | Source: Ambulatory Visit | Attending: Obstetrics & Gynecology | Admitting: Obstetrics & Gynecology

## 2021-04-19 ENCOUNTER — Encounter (HOSPITAL_BASED_OUTPATIENT_CLINIC_OR_DEPARTMENT_OTHER): Payer: Self-pay | Admitting: Obstetrics & Gynecology

## 2021-04-19 ENCOUNTER — Ambulatory Visit (HOSPITAL_BASED_OUTPATIENT_CLINIC_OR_DEPARTMENT_OTHER): Payer: Medicare HMO | Admitting: Obstetrics & Gynecology

## 2021-04-19 ENCOUNTER — Other Ambulatory Visit: Payer: Self-pay

## 2021-04-19 VITALS — BP 188/52 | HR 46 | Ht 59.0 in | Wt 130.6 lb

## 2021-04-19 DIAGNOSIS — N898 Other specified noninflammatory disorders of vagina: Secondary | ICD-10-CM | POA: Diagnosis not present

## 2021-04-19 DIAGNOSIS — N760 Acute vaginitis: Secondary | ICD-10-CM | POA: Diagnosis not present

## 2021-04-19 DIAGNOSIS — N8110 Cystocele, unspecified: Secondary | ICD-10-CM

## 2021-04-19 NOTE — Progress Notes (Signed)
GYNECOLOGY  VISIT  CC:   recheck of vaginal lesion  HPI: 80 y.o. G22P0 Widowed White or Caucasian female here for recheck of vaginal lesion noted at exam on 03/09/2021.  Pt has large cystocele and likely incomplete bladder emptying.  She was started on vaginal estrogen cream by urology and referred to me for possible pessary placement.  This was attempted at prior visit without success.  We did discuss other surgical options.  She does not want to proceed with any major surgery.  Colpocleisis discussed.  Pt was interested in this at her last visit.  Urogyn referral was made.  Pt has been called but decided not to schedule appt as she really doesn't want any surgery.  I did confirm with Dr. [name], her cardiologist, that she could continue the vaginal premarin cream.  She is on Coumadin due to having a mechanical heart valve.  Pt was advised to continue using the vaginal estrogen cream to help prevent UTIs (which haven't occurred since starting the vaginal estrogen cream) and to see if a whitish vaginal lesion that is present would resolve.  I felt the lesion was due to rubbing of the vagina into her pad/underwear.  She is back for recheck today.  Denies vaginal bleeding or discharge.  GYNECOLOGIC HISTORY: No LMP recorded. Patient has had a hysterectomy.   Patient Active Problem List   Diagnosis Date Noted   Asymmetric SNHL (sensorineural hearing loss) 01/06/2021   Bilateral hearing loss 08/31/2020   Bilateral impacted cerumen 08/31/2020   CKD (chronic kidney disease), stage IV ([place]) 10/13/2019   Leukopenia due to antineoplastic chemotherapy ([place]) 08/18/2019   Chemotherapy induced neutropenia ([place]) 07/21/2019   Arteritis ([place]) 06/06/2019   Pancytopenia ([place]) 05/28/2019   Dyspnea 05/28/2019   Vasculitis, ANCA positive ([place]) 05/28/2019   Renal insufficiency 05/08/2019   Acute on chronic diastolic CHF (congestive heart failure) ([place]) 04/07/2019   Left leg cellulitis 04/07/2019   PAF (paroxysmal  atrial fibrillation) ([place]) 04/07/2019   Iron deficiency anemia 02/28/2019   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 ([place]) 02/28/2019   CKD (chronic kidney disease) stage 3, GFR 30-59 ml/min (HCC) 02/24/2019   Chronic pain 06/19/2018   Malnutrition of moderate degree 03/12/2018   Acute on chronic diastolic heart failure ([place]) 03/06/2018   Diabetes mellitus type 2, controlled ([place]) 03/06/2018   S/P cholecystectomy 03/06/2018   HTN (hypertension) 03/06/2018   H/O mechanical aortic valve replacement 03/06/2018   Moderate to severe pulmonary hypertension ([place]) 03/06/2018   GERD (gastroesophageal reflux disease) 02/21/2018   Anxiety 02/21/2018   Chronic diastolic CHF (congestive heart failure) ([place]) 02/21/2018   Diarrhea 02/21/2018   General weakness 02/02/2018   Thrombocytopenia ([place]) 01/25/2018   Arm pain, diffuse 01/25/2018   Anemia 01/23/2018   Cholelithiasis 01/23/2018   Aortic atherosclerosis ([place]) 01/23/2018   GI bleed 01/18/2018   Age-related osteoporosis without current pathological fracture 05/24/2017   Carotid artery occlusion 05/24/2017   Coronary artery disease 08/18/2013   Carotid artery disease ([place]) 08/18/2013   Peripheral arterial disease ([place]) 08/18/2013   Diabetes mellitus without complication ([place]) 16/07/9603   Benign positional vertigo 10/06/2011   Hyponatremia 10/06/2011   Hypertension    Hypercholesteremia    Mechanical heart valve present    Chronic anticoagulation     Past Medical History:  Diagnosis Date   Aortic atherosclerosis ([place]) 01/23/2018   Atrial fibrillation ([place])    Carotid artery disease ([place])    Cholelithiasis 01/23/2018   Chronic anticoagulation    Coronary artery  disease    status post coronary artery bypass grafting times 07/10/2004   Diabetes mellitus    Hypercholesteremia    Hypertension    Mechanical heart valve present    H. aortic valve replacement at the time of bypass surgery October 2005   Moderate to  severe pulmonary hypertension ([place])    Peripheral arterial disease ([place])    history of left common iliac artery PTA and stenting for a chronic total occlusion 08/26/01    Past Surgical History:  Procedure Laterality Date   anterior repair  2009   AORTIC VALVE REPLACEMENT  2005   Dr. Cyndia Bent   CARDIAC CATHETERIZATION  11/10/2004   40% right common illiac, 70% in stent restenosis of distal left common illiac,    CARDIAC CATHETERIZATION  05/18/2004   LAD 50-70% midstenosis, RCA dominant w/50% stenosis, 50% Right common Illiac artery ostial stenosis, 90% in stent restenosis within midportion of left common illiac stent   Carotid Duplex  03/12/2012   RSA-elev. velocities suggestive of a 50-69% diameter reduction, Right&Left Bulb/Prox ICA-mild-mod.fibrous plaqueelevating Velocities abnormal study.   CHOLECYSTECTOMY N/A 03/01/2018   Procedure: LAPAROSCOPIC CHOLECYSTECTOMY;  Surgeon: Judeth Horn, MD;  Location: [facility];  Service: General;  Laterality: N/A;   COLONOSCOPY WITH PROPOFOL N/A 01/22/2018   Procedure: COLONOSCOPY WITH PROPOFOL;  Surgeon: Wilford Corner, MD;  Location: [facility];  Service: Endoscopy;  Laterality: N/A;   ESOPHAGOGASTRODUODENOSCOPY (EGD) WITH PROPOFOL N/A 01/22/2018   Procedure: ESOPHAGOGASTRODUODENOSCOPY (EGD) WITH PROPOFOL;  Surgeon: Wilford Corner, MD;  Location: [place];  Service: Endoscopy;  Laterality: N/A;   ESOPHAGOGASTRODUODENOSCOPY (EGD) WITH PROPOFOL N/A 11/21/2018   Procedure: ESOPHAGOGASTRODUODENOSCOPY (EGD) WITH PROPOFOL;  Surgeon: [name], MD;  Location: [facility];  Service: Gastroenterology;  Laterality: N/A;   ESOPHAGOGASTRODUODENOSCOPY (EGD) WITH PROPOFOL Left 02/27/2019   Procedure: ESOPHAGOGASTRODUODENOSCOPY (EGD) WITH PROPOFOL;  Surgeon: Arta Silence, MD;  [facility];  Service: Endoscopy;  Laterality: Left;   GIVENS CAPSULE STUDY N/A 11/21/2018   Procedure: GIVENS CAPSULE STUDY;  Surgeon: [name], MD;  Location: [place];  Service: Gastroenterology;  Laterality: N/A;  To be deployed during EGD   Lower Ext. Duplex  03/12/2012   Right Proximal CIA- vessel narrowing w/elevated velocities 0-49% diameter reduction. Right SFA-mild mixed density plaque throughout vessel.   NM MYOCAR PERF WALL MOTION  05/19/2010   protocol: Persantine, post stress EF 65%, negative for ischemia, low risk scan   RIGHT HEART CATH N/A 06/27/2018   Procedure: RIGHT HEART CATH;  Surgeon: [name], MD;  Location: [facility] CV LAB;  Service: Cardiovascular;  Laterality: N/A;   TOTAL ABDOMINAL HYSTERECTOMY W/ BILATERAL SALPINGOOPHORECTOMY  1989   TRANSTHORACIC ECHOCARDIOGRAM  08/29/2012   Moderately calcified annulus of mitral valve, moderate regurg. of both mitral valve and tricuspid valve.     MEDS:   Current Outpatient Medications on File Prior to Visit  Medication Sig Dispense Refill   acetaminophen (TYLENOL) 325 MG tablet Take 650 mg by mouth every 6 (six) hours as needed for mild pain.      allopurinol (ZYLOPRIM) 100 MG tablet Take 100 mg by mouth daily.     ALPRAZolam (XANAX) 0.5 MG tablet Take 0.25 mg by mouth at bedtime.     amLODipine (NORVASC) 10 MG tablet Take 1 tablet (10 mg total) by mouth daily. 90 tablet 3   atorvastatin (LIPITOR) 80 MG tablet Take 1 tablet (80 mg total) by mouth daily at 6 PM.     Cholecalciferol (VITAMIN D3) 1000 UNITS CAPS Take  2,000 Units by mouth daily with lunch.      cloNIDine (CATAPRES) 0.2 MG tablet TAKE 1 TABLET TWICE DAILY 180 tablet 3   Cyanocobalamin (VITAMIN B12) 1000 MCG TBCR Take 1,000 mcg by mouth daily with lunch.     furosemide (LASIX) 40 MG tablet Take 2 tablets (80 mg total) by mouth in the morning AND 1.5 tablets (60 mg total) every evening. 120 tablet 3   hydrALAZINE (APRESOLINE) 100 MG tablet TAKE 1 TABLET THREE TIMES DAILY 270 tablet 3   lactose free nutrition (BOOST) LIQD Take 237 mLs by mouth daily. (To supplement meals)      magnesium oxide (MAG-OX) 400 (241.3  Mg) MG tablet Take 1 tablet (400 mg total) by mouth daily.     metoprolol succinate (TOPROL XL) 25 MG 24 hr tablet Take 0.5 tablets (12.5 mg total) by mouth daily. 45 tablet 3   pantoprazole (PROTONIX) 40 MG tablet Take 40 mg by mouth 2 (two) times daily.      spironolactone (ALDACTONE) 25 MG tablet TAKE 1 TABLET EVERY DAY 90 tablet 3   tadalafil (CIALIS) 20 MG tablet Take 2 tablets (40 mg total) by mouth daily. For [place]. 60 tablet 3   vitamin C (ASCORBIC ACID) 500 MG tablet Take 500 mg by mouth daily with lunch.     warfarin (COUMADIN) 5 MG tablet TAKE 1 AND 1/2 TABLETS TO 2 TABLETS DAILY AS DIRECTED BY COUMADIN CLINIC 180 tablet 0   No current facility-administered medications on file prior to visit.    ALLERGIES: Flagyl [metronidazole], Coreg [carvedilol], Losartan, Verapamil, Zetia [ezetimibe], Zocor [simvastatin - high dose], and Gatifloxacin  Family History  Problem Relation Age of Onset   Breast cancer Neg Hx     SH:  widowed, non smoker  Review of Systems  Constitutional: Negative.   Genitourinary: Negative.    PHYSICAL EXAMINATION:    BP (!) 188/52 (BP Location: Right Arm, Patient Position: Sitting, Cuff Size: Small)   Pulse (!) 46   Ht 4\' 11"  (1.499 m) Comment: reported  Wt 130 lb 9.6 oz (59.2 kg)   BMI 26.38 kg/m     General appearance: alert, cooperative and appears stated age Lymph:  no inguinal LAD noted  Pelvic: External genitalia:  no lesions              Urethra:  normal appearing urethra with no masses, tenderness or lesions              Bartholins and Skenes: normal                 Vagina: large cystocele, 4th degree with valsalva, same whitish lesion still present on vaginal mucosa              Cervix: absent              Bimanual Exam:  Uterus:  uterus absent              Adnexa: no mass, fullness, tenderness   Biopsy of vaginal lesion recommended.  Consent obtained.  Risks of bleeding, infection, need for treatment if abnormal pathology present all  discussed.  Area cleansed with Betadine x 3.  Using biopsy forceps, small biopsy was obtained for representative sample.  Monsels used for hemostasis.  Pt tolerated procedure well.  Sterile technique used throughout procedure.  Chaperone, Octaviano Batty, CMA, was present for exam.  Assessment/Plan: 1. Vaginal lesion - Surgical pathology( [place]/ [facility])  2. Female cystocele - advised pt to  continue using vaginal estrogen cream, 1/2 gram pv twice weekly.  Does not need RF. - pt does not desire to see urogynecology at this point.

## 2021-04-20 ENCOUNTER — Encounter: Payer: Self-pay | Admitting: Oncology

## 2021-04-21 LAB — SURGICAL PATHOLOGY

## 2021-04-26 ENCOUNTER — Telehealth: Payer: Self-pay | Admitting: *Deleted

## 2021-04-26 ENCOUNTER — Inpatient Hospital Stay: Payer: Medicare HMO | Attending: Oncology

## 2021-04-26 ENCOUNTER — Other Ambulatory Visit: Payer: Self-pay

## 2021-04-26 ENCOUNTER — Inpatient Hospital Stay: Payer: Medicare HMO

## 2021-04-26 VITALS — BP 174/48 | HR 67 | Temp 98.0°F | Resp 18

## 2021-04-26 DIAGNOSIS — N189 Chronic kidney disease, unspecified: Secondary | ICD-10-CM | POA: Insufficient documentation

## 2021-04-26 DIAGNOSIS — I776 Arteritis, unspecified: Secondary | ICD-10-CM | POA: Diagnosis not present

## 2021-04-26 DIAGNOSIS — D631 Anemia in chronic kidney disease: Secondary | ICD-10-CM | POA: Diagnosis not present

## 2021-04-26 DIAGNOSIS — D594 Other nonautoimmune hemolytic anemias: Secondary | ICD-10-CM

## 2021-04-26 DIAGNOSIS — D696 Thrombocytopenia, unspecified: Secondary | ICD-10-CM

## 2021-04-26 DIAGNOSIS — I739 Peripheral vascular disease, unspecified: Secondary | ICD-10-CM

## 2021-04-26 DIAGNOSIS — D500 Iron deficiency anemia secondary to blood loss (chronic): Secondary | ICD-10-CM

## 2021-04-26 DIAGNOSIS — D508 Other iron deficiency anemias: Secondary | ICD-10-CM

## 2021-04-26 LAB — CBC WITH DIFFERENTIAL/PLATELET
Abs Immature Granulocytes: 0.03 10*3/uL (ref 0.00–0.07)
Basophils Absolute: 0.1 10*3/uL (ref 0.0–0.1)
Basophils Relative: 1 %
Eosinophils Absolute: 0.3 10*3/uL (ref 0.0–0.5)
Eosinophils Relative: 5 %
HCT: 29.5 % — ABNORMAL LOW (ref 36.0–46.0)
Hemoglobin: 9.7 g/dL — ABNORMAL LOW (ref 12.0–15.0)
Immature Granulocytes: 1 %
Lymphocytes Relative: 19 %
Lymphs Abs: 1.2 10*3/uL (ref 0.7–4.0)
MCH: 31.4 pg (ref 26.0–34.0)
MCHC: 32.9 g/dL (ref 30.0–36.0)
MCV: 95.5 fL (ref 80.0–100.0)
Monocytes Absolute: 0.6 10*3/uL (ref 0.1–1.0)
Monocytes Relative: 9 %
Neutro Abs: 4.1 10*3/uL (ref 1.7–7.7)
Neutrophils Relative %: 65 %
Platelets: 157 10*3/uL (ref 150–400)
RBC: 3.09 MIL/uL — ABNORMAL LOW (ref 3.87–5.11)
RDW: 15.1 % (ref 11.5–15.5)
WBC: 6.3 10*3/uL (ref 4.0–10.5)
nRBC: 0 % (ref 0.0–0.2)

## 2021-04-26 LAB — RETICULOCYTES
Immature Retic Fract: 7 % (ref 2.3–15.9)
RBC.: 3.08 MIL/uL — ABNORMAL LOW (ref 3.87–5.11)
Retic Count, Absolute: 44.7 10*3/uL (ref 19.0–186.0)
Retic Ct Pct: 1.5 % (ref 0.4–3.1)

## 2021-04-26 LAB — SAVE SMEAR(SSMR), FOR PROVIDER SLIDE REVIEW

## 2021-04-26 LAB — FERRITIN: Ferritin: 358 ng/mL — ABNORMAL HIGH (ref 11–307)

## 2021-04-26 MED ORDER — EPOETIN ALFA-EPBX 40000 UNIT/ML IJ SOLN
INTRAMUSCULAR | Status: AC
  Filled 2021-04-26: qty 1

## 2021-04-26 MED ORDER — EPOETIN ALFA-EPBX 40000 UNIT/ML IJ SOLN
40000.0000 [IU] | Freq: Once | INTRAMUSCULAR | Status: AC
  Administered 2021-04-26: 40000 [IU] via SUBCUTANEOUS

## 2021-04-26 MED ORDER — EPOETIN ALFA-EPBX 20000 UNIT/ML IJ SOLN
20000.0000 [IU] | Freq: Once | INTRAMUSCULAR | Status: AC
  Administered 2021-04-26: 20000 [IU] via SUBCUTANEOUS
  Filled 2021-04-26: qty 1

## 2021-04-26 NOTE — Telephone Encounter (Signed)
Per MD review of elevated BP- ok given to proceed with retacrit injection today.

## 2021-04-26 NOTE — Patient Instructions (Signed)
Epoetin Alfa injection What is this medication? EPOETIN ALFA (e POE e tin AL fa) helps your body make more red blood cells. This medicine is used to treat anemia caused by chronic kidney disease, cancer chemotherapy, or HIV-therapy. It may also be used before surgery if you have anemia. This medicine may be used for other purposes; ask your health care provider or pharmacist if you have questions. COMMON BRAND NAME(S): Epogen, Procrit, Retacrit What should I tell my care team before I take this medication? They need to know if you have any of these conditions: cancer heart disease high blood pressure history of blood clots history of stroke low levels of folate, iron, or vitamin B12 in the blood seizures an unusual or allergic reaction to erythropoietin, albumin, benzyl alcohol, hamster proteins, other medicines, foods, dyes, or preservatives pregnant or trying to get pregnant breast-feeding How should I use this medication? This medicine is for injection into a vein or under the skin. It is usually given by a health care professional in a hospital or clinic setting. If you get this medicine at home, you will be taught how to prepare and give this medicine. Use exactly as directed. Take your medicine at regular intervals. Do not take your medicine more often than directed. It is important that you put your used needles and syringes in a special sharps container. Do not put them in a trash can. If you do not have a sharps container, call your pharmacist or healthcare provider to get one. A special MedGuide will be given to you by the pharmacist with each prescription and refill. Be sure to read this information carefully each time. Talk to your pediatrician regarding the use of this medicine in children. While this drug may be prescribed for selected conditions, precautions do apply. Overdosage: If you think you have taken too much of this medicine contact a poison control center or emergency  room at once. NOTE: This medicine is only for you. Do not share this medicine with others. What if I miss a dose? If you miss a dose, take it as soon as you can. If it is almost time for your next dose, take only that dose. Do not take double or extra doses. What may interact with this medication? Interactions have not been studied. This list may not describe all possible interactions. Give your health care provider a list of all the medicines, herbs, non-prescription drugs, or dietary supplements you use. Also tell them if you smoke, drink alcohol, or use illegal drugs. Some items may interact with your medicine. What should I watch for while using this medication? Your condition will be monitored carefully while you are receiving this medicine. You may need blood work done while you are taking this medicine. This medicine may cause a decrease in vitamin B6. You should make sure that you get enough vitamin B6 while you are taking this medicine. Discuss the foods you eat and the vitamins you take with your health care professional. What side effects may I notice from receiving this medication? Side effects that you should report to your doctor or health care professional as soon as possible: allergic reactions like skin rash, itching or hives, swelling of the face, lips, or tongue seizures signs and symptoms of a blood clot such as breathing problems; changes in vision; chest pain; severe, sudden headache; pain, swelling, warmth in the leg; trouble speaking; sudden numbness or weakness of the face, arm or leg signs and symptoms of a stroke like   changes in vision; confusion; trouble speaking or understanding; severe headaches; sudden numbness or weakness of the face, arm or leg; trouble walking; dizziness; loss of balance or coordination Side effects that usually do not require medical attention (report to your doctor or health care professional if they continue or are  bothersome): chills cough dizziness fever headaches joint pain muscle cramps muscle pain nausea, vomiting pain, redness, or irritation at site where injected This list may not describe all possible side effects. Call your doctor for medical advice about side effects. You may report side effects to FDA at 1-800-FDA-1088. Where should I keep my medication? Keep out of the reach of children. Store in a refrigerator between 2 and 8 degrees C (36 and 46 degrees F). Do not freeze or shake. Throw away any unused portion if using a single-dose vial. Multi-dose vials can be kept in the refrigerator for up to 21 days after the initial dose. Throw away unused medicine. NOTE: This sheet is a summary. It may not cover all possible information. If you have questions about this medicine, talk to your doctor, pharmacist, or health care provider.  2022 Elsevier/Gold Standard (2017-05-04 08:35:19)  

## 2021-05-04 ENCOUNTER — Ambulatory Visit (HOSPITAL_COMMUNITY): Admission: RE | Admit: 2021-05-04 | Payer: Medicare HMO | Source: Ambulatory Visit

## 2021-05-04 ENCOUNTER — Ambulatory Visit (HOSPITAL_COMMUNITY)
Admission: RE | Admit: 2021-05-04 | Payer: Medicare HMO | Source: Ambulatory Visit | Attending: Cardiovascular Disease | Admitting: Cardiovascular Disease

## 2021-05-04 ENCOUNTER — Ambulatory Visit (HOSPITAL_COMMUNITY): Payer: Medicare HMO

## 2021-05-06 ENCOUNTER — Ambulatory Visit (INDEPENDENT_AMBULATORY_CARE_PROVIDER_SITE_OTHER): Payer: Medicare HMO

## 2021-05-06 ENCOUNTER — Other Ambulatory Visit: Payer: Self-pay

## 2021-05-06 DIAGNOSIS — Z952 Presence of prosthetic heart valve: Secondary | ICD-10-CM

## 2021-05-06 DIAGNOSIS — Z7901 Long term (current) use of anticoagulants: Secondary | ICD-10-CM | POA: Diagnosis not present

## 2021-05-06 LAB — POCT INR: INR: 4.5 — AB (ref 2.0–3.0)

## 2021-05-06 NOTE — Patient Instructions (Signed)
Description   Hold tonight and tomorrow night's dose. Continue taking 1.5 tablets (7.5mg ) daily. Recheck INR in 10 days.  Call [phone] if you need to change the appointment or if you have a procedure.

## 2021-05-08 DIAGNOSIS — I482 Chronic atrial fibrillation, unspecified: Secondary | ICD-10-CM | POA: Diagnosis not present

## 2021-05-08 DIAGNOSIS — I5032 Chronic diastolic (congestive) heart failure: Secondary | ICD-10-CM | POA: Diagnosis not present

## 2021-05-08 DIAGNOSIS — I10 Essential (primary) hypertension: Secondary | ICD-10-CM | POA: Diagnosis not present

## 2021-05-08 DIAGNOSIS — I130 Hypertensive heart and chronic kidney disease with heart failure and stage 1 through stage 4 chronic kidney disease, or unspecified chronic kidney disease: Secondary | ICD-10-CM | POA: Diagnosis not present

## 2021-05-08 DIAGNOSIS — N184 Chronic kidney disease, stage 4 (severe): Secondary | ICD-10-CM | POA: Diagnosis not present

## 2021-05-09 ENCOUNTER — Telehealth: Payer: Self-pay

## 2021-05-09 ENCOUNTER — Other Ambulatory Visit: Payer: Self-pay

## 2021-05-09 DIAGNOSIS — D61818 Other pancytopenia: Secondary | ICD-10-CM

## 2021-05-09 NOTE — Telephone Encounter (Signed)
Pt called and states she received two letters from Medicare denying her Retacrit. Per our auth. Team, [facility] shows: auth# [id number]. Valid 04/26/21 - 10/11/21. [id number] - Retacrit. Pt made aware. She verbalized thanks and understanding.

## 2021-05-11 ENCOUNTER — Other Ambulatory Visit (HOSPITAL_BASED_OUTPATIENT_CLINIC_OR_DEPARTMENT_OTHER): Payer: Self-pay | Admitting: Obstetrics & Gynecology

## 2021-05-11 ENCOUNTER — Telehealth (HOSPITAL_BASED_OUTPATIENT_CLINIC_OR_DEPARTMENT_OTHER): Payer: Self-pay | Admitting: Obstetrics & Gynecology

## 2021-05-11 DIAGNOSIS — Z1231 Encounter for screening mammogram for malignant neoplasm of breast: Secondary | ICD-10-CM

## 2021-05-11 NOTE — Progress Notes (Signed)
x

## 2021-05-11 NOTE — Telephone Encounter (Signed)
Called patient and left message that they have been trying to reach her and set up appointment with Dr. Sherlene Shams the urogynecologist.

## 2021-05-12 ENCOUNTER — Other Ambulatory Visit (HOSPITAL_COMMUNITY): Payer: Self-pay | Admitting: Cardiology

## 2021-05-13 ENCOUNTER — Ambulatory Visit: Payer: Medicare HMO

## 2021-05-13 ENCOUNTER — Telehealth (HOSPITAL_COMMUNITY): Payer: Self-pay | Admitting: *Deleted

## 2021-05-13 ENCOUNTER — Encounter (HOSPITAL_COMMUNITY): Payer: Self-pay | Admitting: *Deleted

## 2021-05-13 DIAGNOSIS — I2510 Atherosclerotic heart disease of native coronary artery without angina pectoris: Secondary | ICD-10-CM | POA: Diagnosis not present

## 2021-05-13 DIAGNOSIS — I5032 Chronic diastolic (congestive) heart failure: Secondary | ICD-10-CM | POA: Diagnosis not present

## 2021-05-13 DIAGNOSIS — M160 Bilateral primary osteoarthritis of hip: Secondary | ICD-10-CM | POA: Diagnosis not present

## 2021-05-13 DIAGNOSIS — I482 Chronic atrial fibrillation, unspecified: Secondary | ICD-10-CM | POA: Diagnosis not present

## 2021-05-13 DIAGNOSIS — E11319 Type 2 diabetes mellitus with unspecified diabetic retinopathy without macular edema: Secondary | ICD-10-CM | POA: Diagnosis not present

## 2021-05-13 DIAGNOSIS — Z7901 Long term (current) use of anticoagulants: Secondary | ICD-10-CM | POA: Diagnosis not present

## 2021-05-13 DIAGNOSIS — M810 Age-related osteoporosis without current pathological fracture: Secondary | ICD-10-CM | POA: Diagnosis not present

## 2021-05-13 DIAGNOSIS — I739 Peripheral vascular disease, unspecified: Secondary | ICD-10-CM | POA: Diagnosis not present

## 2021-05-13 NOTE — Telephone Encounter (Signed)
Called pt no answer. Mychart msg sent.  

## 2021-05-13 NOTE — Telephone Encounter (Signed)
Pt left vm stating heart rate was running in the 40's for 1 month. Pt stopped the medication on Sunday and her heart rate is in the 50s and she said she feels better. Pt wants to know if its ok for her to remain off of metoprolol.    Routed to [facility]

## 2021-05-16 ENCOUNTER — Other Ambulatory Visit: Payer: Self-pay

## 2021-05-16 ENCOUNTER — Ambulatory Visit (INDEPENDENT_AMBULATORY_CARE_PROVIDER_SITE_OTHER): Payer: Medicare HMO

## 2021-05-16 DIAGNOSIS — Z952 Presence of prosthetic heart valve: Secondary | ICD-10-CM

## 2021-05-16 DIAGNOSIS — Z5181 Encounter for therapeutic drug level monitoring: Secondary | ICD-10-CM

## 2021-05-16 DIAGNOSIS — Z7901 Long term (current) use of anticoagulants: Secondary | ICD-10-CM

## 2021-05-16 LAB — POCT INR: INR: 2.7 (ref 2.0–3.0)

## 2021-05-16 NOTE — Patient Instructions (Signed)
-   Continue taking 1.5 tablets (7.5mg ) daily.  - Recheck INR in 4 weeks.   Call [phone] if you need to change the appointment or if you have a procedure.

## 2021-05-19 ENCOUNTER — Ambulatory Visit (HOSPITAL_COMMUNITY)
Admission: RE | Admit: 2021-05-19 | Discharge: 2021-05-19 | Disposition: A | Discharge status: Home / Self Care | Payer: Medicare HMO | Source: Ambulatory Visit | Attending: Cardiology | Admitting: Cardiology

## 2021-05-19 ENCOUNTER — Ambulatory Visit (HOSPITAL_BASED_OUTPATIENT_CLINIC_OR_DEPARTMENT_OTHER)
Admission: RE | Admit: 2021-05-19 | Discharge: 2021-05-19 | Disposition: A | Discharge status: Home / Self Care | Payer: Medicare HMO | Source: Ambulatory Visit | Attending: Cardiovascular Disease | Admitting: Cardiovascular Disease

## 2021-05-19 ENCOUNTER — Other Ambulatory Visit (HOSPITAL_COMMUNITY): Payer: Self-pay | Admitting: Cardiovascular Disease

## 2021-05-19 ENCOUNTER — Other Ambulatory Visit: Payer: Self-pay

## 2021-05-19 DIAGNOSIS — I6523 Occlusion and stenosis of bilateral carotid arteries: Secondary | ICD-10-CM

## 2021-05-19 DIAGNOSIS — I779 Disorder of arteries and arterioles, unspecified: Secondary | ICD-10-CM | POA: Diagnosis not present

## 2021-05-19 DIAGNOSIS — Z95828 Presence of other vascular implants and grafts: Secondary | ICD-10-CM

## 2021-05-19 DIAGNOSIS — I739 Peripheral vascular disease, unspecified: Secondary | ICD-10-CM | POA: Insufficient documentation

## 2021-05-20 ENCOUNTER — Encounter: Payer: Self-pay | Admitting: *Deleted

## 2021-05-24 ENCOUNTER — Inpatient Hospital Stay: Payer: Medicare HMO

## 2021-05-24 ENCOUNTER — Other Ambulatory Visit: Payer: Self-pay

## 2021-05-24 ENCOUNTER — Inpatient Hospital Stay: Payer: Medicare HMO | Attending: Oncology

## 2021-05-24 VITALS — BP 166/70 | HR 58 | Temp 97.8°F | Resp 16

## 2021-05-24 DIAGNOSIS — D631 Anemia in chronic kidney disease: Secondary | ICD-10-CM | POA: Diagnosis not present

## 2021-05-24 DIAGNOSIS — N189 Chronic kidney disease, unspecified: Secondary | ICD-10-CM | POA: Insufficient documentation

## 2021-05-24 DIAGNOSIS — D696 Thrombocytopenia, unspecified: Secondary | ICD-10-CM

## 2021-05-24 DIAGNOSIS — I739 Peripheral vascular disease, unspecified: Secondary | ICD-10-CM

## 2021-05-24 DIAGNOSIS — D508 Other iron deficiency anemias: Secondary | ICD-10-CM

## 2021-05-24 DIAGNOSIS — D594 Other nonautoimmune hemolytic anemias: Secondary | ICD-10-CM

## 2021-05-24 DIAGNOSIS — D500 Iron deficiency anemia secondary to blood loss (chronic): Secondary | ICD-10-CM

## 2021-05-24 DIAGNOSIS — I776 Arteritis, unspecified: Secondary | ICD-10-CM | POA: Insufficient documentation

## 2021-05-24 DIAGNOSIS — D61818 Other pancytopenia: Secondary | ICD-10-CM

## 2021-05-24 LAB — CMP (CANCER CENTER ONLY)
ALT: 14 U/L (ref 0–44)
AST: 20 U/L (ref 15–41)
Albumin: 3.7 g/dL (ref 3.5–5.0)
Alkaline Phosphatase: 91 U/L (ref 38–126)
Anion gap: 10 (ref 5–15)
BUN: 84 mg/dL — ABNORMAL HIGH (ref 8–23)
CO2: 20 mmol/L — ABNORMAL LOW (ref 22–32)
Calcium: 9.2 mg/dL (ref 8.9–10.3)
Chloride: 104 mmol/L (ref 98–111)
Creatinine: 2.5 mg/dL — ABNORMAL HIGH (ref 0.44–1.00)
GFR, Estimated: 19 mL/min — ABNORMAL LOW (ref >60)
Glucose, Bld: 195 mg/dL — ABNORMAL HIGH (ref 70–99)
Potassium: 4.4 mmol/L (ref 3.5–5.1)
Sodium: 134 mmol/L — ABNORMAL LOW (ref 135–145)
Total Bilirubin: 0.4 mg/dL (ref 0.3–1.2)
Total Protein: 6.4 g/dL — ABNORMAL LOW (ref 6.5–8.1)

## 2021-05-24 LAB — FERRITIN: Ferritin: 281 ng/mL (ref 11–307)

## 2021-05-24 LAB — CBC WITH DIFFERENTIAL (CANCER CENTER ONLY)
Abs Immature Granulocytes: 0.03 10*3/uL (ref 0.00–0.07)
Basophils Absolute: 0.1 10*3/uL (ref 0.0–0.1)
Basophils Relative: 1 %
Eosinophils Absolute: 0.2 10*3/uL (ref 0.0–0.5)
Eosinophils Relative: 4 %
HCT: 27.8 % — ABNORMAL LOW (ref 36.0–46.0)
Hemoglobin: 9.2 g/dL — ABNORMAL LOW (ref 12.0–15.0)
Immature Granulocytes: 1 %
Lymphocytes Relative: 18 %
Lymphs Abs: 1 10*3/uL (ref 0.7–4.0)
MCH: 31.7 pg (ref 26.0–34.0)
MCHC: 33.1 g/dL (ref 30.0–36.0)
MCV: 95.9 fL (ref 80.0–100.0)
Monocytes Absolute: 0.5 10*3/uL (ref 0.1–1.0)
Monocytes Relative: 8 %
Neutro Abs: 4 10*3/uL (ref 1.7–7.7)
Neutrophils Relative %: 68 %
Platelet Count: 139 10*3/uL — ABNORMAL LOW (ref 150–400)
RBC: 2.9 MIL/uL — ABNORMAL LOW (ref 3.87–5.11)
RDW: 14.8 % (ref 11.5–15.5)
WBC Count: 5.8 10*3/uL (ref 4.0–10.5)
nRBC: 0 % (ref 0.0–0.2)

## 2021-05-24 LAB — SAVE SMEAR(SSMR), FOR PROVIDER SLIDE REVIEW

## 2021-05-24 LAB — RETICULOCYTES
Immature Retic Fract: 5.8 % (ref 2.3–15.9)
RBC.: 2.89 MIL/uL — ABNORMAL LOW (ref 3.87–5.11)
Retic Count, Absolute: 37.9 10*3/uL (ref 19.0–186.0)
Retic Ct Pct: 1.3 % (ref 0.4–3.1)

## 2021-05-24 MED ORDER — EPOETIN ALFA-EPBX 20000 UNIT/ML IJ SOLN
20000.0000 [IU] | Freq: Once | INTRAMUSCULAR | Status: AC
  Administered 2021-05-24: 20000 [IU] via SUBCUTANEOUS
  Filled 2021-05-24: qty 1

## 2021-05-24 MED ORDER — EPOETIN ALFA-EPBX 40000 UNIT/ML IJ SOLN
40000.0000 [IU] | Freq: Once | INTRAMUSCULAR | Status: AC
  Administered 2021-05-24: 40000 [IU] via SUBCUTANEOUS
  Filled 2021-05-24: qty 1

## 2021-05-24 NOTE — Progress Notes (Signed)
BP elevated. Ok to proceed with retacrit per Dr. Jana Hakim.   Larene Beach, PharmD

## 2021-05-24 NOTE — Patient Instructions (Signed)
Epoetin Alfa injection What is this medication? EPOETIN ALFA (e POE e tin AL fa) helps your body make more red blood cells. This medicine is used to treat anemia caused by chronic kidney disease, cancer chemotherapy, or HIV-therapy. It may also be used before surgery if you have anemia. This medicine may be used for other purposes; ask your health care provider or pharmacist if you have questions. COMMON BRAND NAME(S): Epogen, Procrit, Retacrit What should I tell my care team before I take this medication? They need to know if you have any of these conditions: cancer heart disease high blood pressure history of blood clots history of stroke low levels of folate, iron, or vitamin B12 in the blood seizures an unusual or allergic reaction to erythropoietin, albumin, benzyl alcohol, hamster proteins, other medicines, foods, dyes, or preservatives pregnant or trying to get pregnant breast-feeding How should I use this medication? This medicine is for injection into a vein or under the skin. It is usually given by a health care professional in a hospital or clinic setting. If you get this medicine at home, you will be taught how to prepare and give this medicine. Use exactly as directed. Take your medicine at regular intervals. Do not take your medicine more often than directed. It is important that you put your used needles and syringes in a special sharps container. Do not put them in a trash can. If you do not have a sharps container, call your pharmacist or healthcare provider to get one. A special MedGuide will be given to you by the pharmacist with each prescription and refill. Be sure to read this information carefully each time. Talk to your pediatrician regarding the use of this medicine in children. While this drug may be prescribed for selected conditions, precautions do apply. Overdosage: If you think you have taken too much of this medicine contact a poison control center or emergency  room at once. NOTE: This medicine is only for you. Do not share this medicine with others. What if I miss a dose? If you miss a dose, take it as soon as you can. If it is almost time for your next dose, take only that dose. Do not take double or extra doses. What may interact with this medication? Interactions have not been studied. This list may not describe all possible interactions. Give your health care provider a list of all the medicines, herbs, non-prescription drugs, or dietary supplements you use. Also tell them if you smoke, drink alcohol, or use illegal drugs. Some items may interact with your medicine. What should I watch for while using this medication? Your condition will be monitored carefully while you are receiving this medicine. You may need blood work done while you are taking this medicine. This medicine may cause a decrease in vitamin B6. You should make sure that you get enough vitamin B6 while you are taking this medicine. Discuss the foods you eat and the vitamins you take with your health care professional. What side effects may I notice from receiving this medication? Side effects that you should report to your doctor or health care professional as soon as possible: allergic reactions like skin rash, itching or hives, swelling of the face, lips, or tongue seizures signs and symptoms of a blood clot such as breathing problems; changes in vision; chest pain; severe, sudden headache; pain, swelling, warmth in the leg; trouble speaking; sudden numbness or weakness of the face, arm or leg signs and symptoms of a stroke like   changes in vision; confusion; trouble speaking or understanding; severe headaches; sudden numbness or weakness of the face, arm or leg; trouble walking; dizziness; loss of balance or coordination Side effects that usually do not require medical attention (report to your doctor or health care professional if they continue or are  bothersome): chills cough dizziness fever headaches joint pain muscle cramps muscle pain nausea, vomiting pain, redness, or irritation at site where injected This list may not describe all possible side effects. Call your doctor for medical advice about side effects. You may report side effects to FDA at 1-800-FDA-1088. Where should I keep my medication? Keep out of the reach of children. Store in a refrigerator between 2 and 8 degrees C (36 and 46 degrees F). Do not freeze or shake. Throw away any unused portion if using a single-dose vial. Multi-dose vials can be kept in the refrigerator for up to 21 days after the initial dose. Throw away unused medicine. NOTE: This sheet is a summary. It may not cover all possible information. If you have questions about this medicine, talk to your doctor, pharmacist, or health care provider.  2022 Elsevier/Gold Standard (2017-05-04 08:35:19)  

## 2021-05-30 ENCOUNTER — Encounter: Payer: Self-pay | Admitting: Podiatry

## 2021-05-30 ENCOUNTER — Ambulatory Visit: Payer: Medicare HMO | Admitting: Podiatry

## 2021-05-30 ENCOUNTER — Other Ambulatory Visit: Payer: Self-pay

## 2021-05-30 DIAGNOSIS — E1151 Type 2 diabetes mellitus with diabetic peripheral angiopathy without gangrene: Secondary | ICD-10-CM

## 2021-05-30 DIAGNOSIS — M79675 Pain in left toe(s): Secondary | ICD-10-CM | POA: Diagnosis not present

## 2021-05-30 DIAGNOSIS — B351 Tinea unguium: Secondary | ICD-10-CM

## 2021-05-30 DIAGNOSIS — L600 Ingrowing nail: Secondary | ICD-10-CM

## 2021-05-30 DIAGNOSIS — M79674 Pain in right toe(s): Secondary | ICD-10-CM

## 2021-05-30 DIAGNOSIS — N8111 Cystocele, midline: Secondary | ICD-10-CM | POA: Insufficient documentation

## 2021-05-30 DIAGNOSIS — M1610 Unilateral primary osteoarthritis, unspecified hip: Secondary | ICD-10-CM | POA: Insufficient documentation

## 2021-05-30 DIAGNOSIS — R102 Pelvic and perineal pain: Secondary | ICD-10-CM | POA: Insufficient documentation

## 2021-05-31 ENCOUNTER — Ambulatory Visit (INDEPENDENT_AMBULATORY_CARE_PROVIDER_SITE_OTHER): Payer: Medicare HMO

## 2021-05-31 ENCOUNTER — Encounter: Payer: Self-pay | Admitting: Orthopaedic Surgery

## 2021-05-31 ENCOUNTER — Other Ambulatory Visit: Payer: Self-pay

## 2021-05-31 ENCOUNTER — Ambulatory Visit: Payer: Medicare HMO | Admitting: Orthopaedic Surgery

## 2021-05-31 ENCOUNTER — Ambulatory Visit: Payer: Self-pay

## 2021-05-31 VITALS — Ht <= 58 in | Wt 129.0 lb

## 2021-05-31 DIAGNOSIS — M1611 Unilateral primary osteoarthritis, right hip: Secondary | ICD-10-CM

## 2021-05-31 DIAGNOSIS — M25551 Pain in right hip: Secondary | ICD-10-CM

## 2021-05-31 DIAGNOSIS — M25552 Pain in left hip: Secondary | ICD-10-CM

## 2021-05-31 NOTE — Progress Notes (Signed)
Office Visit Note   Patient: [name]           Date of Birth: 22-Dec-1940           MRN: [id number] Visit Date: 05/31/2021              Requested by: [name], MD [address] [place],  [place] [address] PCP: Osborne Casco [name], MD   Assessment & Plan: Visit Diagnoses:  1. Pain in left hip   2. Pain in right hip   3. Unilateral primary osteoarthritis, right hip     Plan: She understands that she has significant arthritis in her right hip.  We may end up recommending a hip replacement but we need to try conservative treatment first and send her to Dr. Ernestina Patches for a fluoroscopic guided steroid injection in her right hip joint.  She wants to try to stay as conservative as possible as well.  I am going to have her try to switch her cane to her opposite side and use it in her left hand to offload her right hip.  All questions and concerns were answered and addressed.  We will see her back in follow-up to see how she has done and go from there.  Follow-Up Instructions: Return in about 6 weeks (around 07/12/2021).   Orders:  Orders Placed This Encounter  Procedures   XR HIP UNILAT W OR W/O PELVIS 2-3 VIEWS LEFT   XR HIP UNILAT W OR W/O PELVIS 2-3 VIEWS RIGHT   No orders of the defined types were placed in this encounter.     Procedures: No procedures performed   Clinical Data: No additional findings.   Subjective: Chief Complaint  Patient presents with   Right Hip - New Patient (Initial Visit)   Left Hip - New Patient (Initial Visit)  The patient comes in today for evaluation treatment of bilateral hip pain but really the right much worse than left.  She does ambulate with a cane and she is 80 years old.  We actually saw her years ago.  She does have significant comorbidities in terms of a previous history of congestive heart failure.  She is on Coumadin.  She has chronic kidney disease as well.  Her family is with her today.  She points to her right groin and  right hip as the main source of her pain.  She does ambulate with a cane but uses in her right hand.  This is been slowly getting worse for many years now.  She has problems lifting and sitting for a while and getting up to walk with stiffness and pain in the right hip.  The pain does wake her up at night.  HPI  Review of Systems There is currently listed no headache, chest pain, shortness of breath, fever, chills, nausea, vomiting  Objective: Vital Signs: Ht 4\' 10"  (1.473 m)   Wt 129 lb (58.5 kg)   BMI 26.96 kg/m   Physical Exam She is alert and orient x3 and in no acute distress Ortho Exam Examination of right hip shows severe pain with internal and external rotation as well as significant stiffness with rotation of the right hip.  There is no pain over the trochanteric area of either hip.  Her left hip moves smoothly with minimal discomfort. Specialty Comments:  No specialty comments available.  Imaging: XR HIP UNILAT W OR W/O PELVIS 2-3 VIEWS LEFT  Result Date: 05/31/2021 An AP pelvis and lateral left hip shows  minimal arthritic changes and a well-maintained hip joint.  XR HIP UNILAT W OR W/O PELVIS 2-3 VIEWS RIGHT  Result Date: 05/31/2021 An AP pelvis and lateral the right hip shows severe end-stage arthritis of the right hip with complete loss of the joint space.  There are para-articular osteophytes as well as sclerotic changes in the femoral head and acetabulum.    PMFS History: Patient Active Problem List   Diagnosis Date Noted   Unilateral primary osteoarthritis, right hip 05/31/2021   Midline cystocele 05/30/2021   Primary localized osteoarthritis of pelvic region and thigh 05/30/2021   Vaginal pain 05/30/2021   Asymmetric SNHL (sensorineural hearing loss) 01/06/2021   Referred otalgia of both ears 01/06/2021   Bilateral hearing loss 08/31/2020   Bilateral impacted cerumen 08/31/2020   Low back pain 02/20/2020   CKD (chronic kidney disease), stage IV ([place])  10/13/2019   Leukopenia due to antineoplastic chemotherapy ([place]) 08/18/2019   Chemotherapy induced neutropenia ([place]) 07/21/2019   Thrombophilia ([place]) 06/19/2019   Encounter for general adult medical examination without abnormal findings 06/13/2019   Arteritis ([place]) 06/06/2019   Hypertensive heart and chronic kidney disease with heart failure and stage 1 through stage 4 chronic kidney disease, or unspecified chronic kidney disease ([place]) 06/06/2019   Muscle weakness 06/06/2019   Pancytopenia ([place]) 05/28/2019   Dyspnea 05/28/2019   Vasculitis, ANCA positive ([place]) 05/28/2019   Disorder of connective tissue ([place]) 05/14/2019   Renal insufficiency 05/08/2019   Acute on chronic diastolic CHF (congestive heart failure) ([place]) 04/07/2019   Left leg cellulitis 04/07/2019   PAF (paroxysmal atrial fibrillation) ([place]) 04/07/2019   Iron deficiency anemia 02/28/2019   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 ([place]) 02/28/2019   CKD (chronic kidney disease) stage 3, GFR 30-59 ml/min ([place]) 02/24/2019   Diabetic retinopathy associated with type 2 diabetes mellitus ([place]) 09/02/2018   Chronic pain 06/19/2018   Non-thrombocytopenic purpura ([place]) 05/21/2018   Gout 03/22/2018   Malnutrition of moderate degree 03/12/2018   Acute on chronic diastolic heart failure ([place]) 03/06/2018   Diabetes mellitus type 2, controlled ([place]) 03/06/2018   S/P cholecystectomy 03/06/2018   HTN (hypertension) 03/06/2018   H/O mechanical aortic valve replacement 03/06/2018   Moderate to severe pulmonary hypertension ([place]) 03/06/2018   GERD (gastroesophageal reflux disease) 02/21/2018   Anxiety 02/21/2018   Chronic diastolic CHF (congestive heart failure) ([place]) 02/21/2018   Diarrhea 02/21/2018   General weakness 02/02/2018   Thrombocytopenia ([place]) 01/25/2018   Arm pain, diffuse 01/25/2018   Anemia 01/23/2018   Cholelithiasis 01/23/2018   Aortic atherosclerosis ([place]) 01/23/2018   GI bleed 01/18/2018    Age-related osteoporosis without current pathological fracture 05/24/2017   Carotid artery occlusion 05/24/2017   Generalized anxiety disorder 05/24/2017   Hearing loss 05/24/2017   Presence of prosthetic heart valve 05/24/2017   Coronary artery disease 08/18/2013   Carotid artery disease ([place]) 08/18/2013   Peripheral arterial disease ([place]) 08/18/2013   Diabetes mellitus without complication ([place]) 86/76/1950   Benign positional vertigo 10/06/2011   Hyponatremia 10/06/2011   Hypertension    Hypercholesteremia    Mechanical heart valve present    Chronic anticoagulation    Past Medical History:  Diagnosis Date   Aortic atherosclerosis ([place]) 01/23/2018   Atrial fibrillation ([place])    Carotid artery disease ([place])    Cholelithiasis 01/23/2018   Chronic anticoagulation    Coronary artery disease    status post coronary artery bypass grafting times 07/10/2004   Diabetes mellitus    Hypercholesteremia  Hypertension    Mechanical heart valve present    H. aortic valve replacement at the time of bypass surgery October 2005   Moderate to severe pulmonary hypertension ([place])    Peripheral arterial disease ([facility])    history of left common iliac artery PTA and stenting for a chronic total occlusion 08/26/01    Family History  Problem Relation Age of Onset   Breast cancer Neg Hx     Past Surgical History:  Procedure Laterality Date   anterior repair  2009   AORTIC VALVE REPLACEMENT  2005   Dr. Cyndia Bent   CARDIAC CATHETERIZATION  11/10/2004   40% right common illiac, 70% in stent restenosis of distal left common illiac,    CARDIAC CATHETERIZATION  05/18/2004   LAD 50-70% midstenosis, RCA dominant w/50% stenosis, 50% Right common Illiac artery ostial stenosis, 90% in stent restenosis within midportion of left common illiac stent   Carotid Duplex  03/12/2012   RSA-elev. velocities suggestive of a 50-69% diameter reduction, Right&Left Bulb/Prox ICA-mild-mod.fibrous plaqueelevating Velocities  abnormal study.   CHOLECYSTECTOMY N/A 03/01/2018   Procedure: LAPAROSCOPIC CHOLECYSTECTOMY;  Surgeon: Judeth Horn, MD;  Location: [facility];  Service: General;  Laterality: N/A;   COLONOSCOPY WITH PROPOFOL N/A 01/22/2018   Procedure: COLONOSCOPY WITH PROPOFOL;  Surgeon: Wilford Corner, MD;  Location: [facility];  Service: Endoscopy;  Laterality: N/A;   ESOPHAGOGASTRODUODENOSCOPY (EGD) WITH PROPOFOL N/A 01/22/2018   Procedure: ESOPHAGOGASTRODUODENOSCOPY (EGD) WITH PROPOFOL;  Surgeon: Wilford Corner, MD;  Location: [facility];  Service: Endoscopy;  Laterality: N/A;   ESOPHAGOGASTRODUODENOSCOPY (EGD) WITH PROPOFOL N/A 11/21/2018   Procedure: ESOPHAGOGASTRODUODENOSCOPY (EGD) WITH PROPOFOL;  Surgeon: [name], MD;  Location: [facility];  Service: Gastroenterology;  Laterality: N/A;   ESOPHAGOGASTRODUODENOSCOPY (EGD) WITH PROPOFOL Left 02/27/2019   Procedure: ESOPHAGOGASTRODUODENOSCOPY (EGD) WITH PROPOFOL;  Surgeon: Arta Silence, MD;  Location: [facility];  Service: Endoscopy;  Laterality: Left;   GIVENS CAPSULE STUDY N/A 11/21/2018   Procedure: GIVENS CAPSULE STUDY;  Surgeon: [name], MD;  Location: [facility];  Service: Gastroenterology;  Laterality: N/A;  To be deployed during EGD   Lower Ext. Duplex  03/12/2012   Right Proximal CIA- vessel narrowing w/elevated velocities 0-49% diameter reduction. Right SFA-mild mixed density plaque throughout vessel.   NM MYOCAR PERF WALL MOTION  05/19/2010   protocol: Persantine, post stress EF 65%, negative for ischemia, low risk scan   RIGHT HEART CATH N/A 06/27/2018   Procedure: RIGHT HEART CATH;  Surgeon: [name], MD;  Location: [facility];  Service: Cardiovascular;  Laterality: N/A;   TOTAL ABDOMINAL HYSTERECTOMY W/ BILATERAL SALPINGOOPHORECTOMY  1989   TRANSTHORACIC ECHOCARDIOGRAM  08/29/2012   Moderately calcified annulus of mitral valve, moderate regurg. of both mitral valve and tricuspid valve.    Social History    Occupational History   Occupation: Retired  Tobacco Use   Smoking status: Former    Packs/day: 1.00    Years: 30.00    Pack years: 30.00    Types: Cigarettes   Smokeless tobacco: Never   Tobacco comments:    quit smoking 2005  Vaping Use   Vaping Use: Never used  Substance and Sexual Activity   Alcohol use: No    Alcohol/week: 0.0 standard drinks   Drug use: No   Sexual activity: Not Currently    Birth control/protection: None

## 2021-06-03 ENCOUNTER — Other Ambulatory Visit (HOSPITAL_COMMUNITY): Payer: Self-pay

## 2021-06-04 NOTE — Progress Notes (Signed)
Subjective:  Patient ID: PEARLENA OW, female    DOB: [birth year],  MRN: [id number]  80 y.o. female presents with at risk foot care. Pt has h/o NIDDM with PAD and painful thick toenails that are difficult to trim. Pain interferes with ambulation. Aggravating factors include wearing enclosed shoe gear. Pain is relieved with periodic professional debridement..    Patient does not monitor blood glucose on a daily basis.  PCP: [name], MD and last visit was: 04/07/2021  Patient states three months is too long between her appointments. Her left great toe is sore today and she states it has grown too long. Denies any redness, drainage or swelling of digits.  Review of Systems: Negative except as noted in the HPI.   Allergies  Allergen Reactions   Flagyl [Metronidazole] Rash    ALL-OVER BODY RASH   Ciprofloxacin     Other reaction(s): Stomach upset - tolerates   Coreg [Carvedilol] Other (See Comments)    Terrible cramping in the feet and had a lot of bowel movements, but not diarrhea   Losartan Swelling    Patient doesn't recall site of swelling   Sulfamethoxazole-Trimethoprim     Other reaction(s): stomach upset .   Verapamil Hives   Zetia [Ezetimibe] Other (See Comments)    Reaction not recalled   Zocor [Simvastatin - High Dose] Other (See Comments)    Reaction not recalled   Gatifloxacin Rash    Redness to skin around eye    Objective:  There were no vitals filed for this visit. Constitutional Patient is a pleasant 80 y.o. Caucasian female WD, WN in NAD. AAO x 3.  Vascular Capillary fill time to digits <3 seconds b/l lower extremities. Faintly palpable DP pulse(s) b/l lower extremities. Faintly palpable PT pulse(s) b/l lower extremities. Pedal hair absent. Lower extremity skin temperature gradient within normal limits. No pain with calf compression b/l. No edema noted b/l lower extremities. No cyanosis or clubbing noted. Wearing open-toed compression knee highs BLE  today.  Neurologic Normal speech. Protective sensation intact 5/5 intact bilaterally with 10g monofilament b/l. Vibratory sensation intact b/l.  Dermatologic Pedal skin is thin shiny, atrophic b/l lower extremities. No open wounds b/l lower extremities. No interdigital macerations b/l lower extremities. Toenails 1-5 b/l elongated, discolored, dystrophic, thickened, crumbly with subungual debris and tenderness to dorsal palpation. Incurvated nailplate left great toe medial border(s)  and right great toe lateral border with tenderness to palpation. No erythema, no edema, no drainage noted.   Orthopedic: Normal muscle strength 5/5 to all lower extremity muscle groups bilaterally. Patient ambulates independent of any assistive aids.    Assessment:   1. Pain due to onychomycosis of toenails of both feet   2. Ingrown toenail without infection   3. Type II diabetes mellitus with peripheral circulatory disorder [facility])    Plan:  Patient was evaluated and treated and all questions answered.  Onychomycosis with pain -Nails palliatively debridement as below. -Educated on self-care  Procedure: Nail Debridement Rationale: Pain Type of Debridement: manual, sharp debridement. Instrumentation: Nail nipper, rotary burr. Number of Nails: 10  -Examined patient. -No new findings. No new orders. -Patient to continue soft, supportive shoe gear daily. -Toenails 1-5 b/l were debrided in length and girth with sterile nail nippers and dremel without iatrogenic bleeding.  -Offending nail borders debrided and curretaged L hallux and R hallux. Borders cleansed with alcohol. Triple antibiotic ointment applied to both great toes. She may apply Neosporn to both great toes once daily for one  week.  -Patient to report any pedal injuries to medical professional immediately. -Patient/POA to call should there be question/concern in the interim. -We have changed the interval of her visits to every 9 weeks.  Return in about  9 weeks (around 08/01/2021).  Marzetta Board, DPM

## 2021-06-06 ENCOUNTER — Telehealth: Payer: Self-pay | Admitting: Physical Medicine and Rehabilitation

## 2021-06-06 DIAGNOSIS — M109 Gout, unspecified: Secondary | ICD-10-CM | POA: Diagnosis not present

## 2021-06-06 DIAGNOSIS — I5032 Chronic diastolic (congestive) heart failure: Secondary | ICD-10-CM | POA: Diagnosis not present

## 2021-06-06 DIAGNOSIS — I776 Arteritis, unspecified: Secondary | ICD-10-CM | POA: Diagnosis not present

## 2021-06-06 DIAGNOSIS — I129 Hypertensive chronic kidney disease with stage 1 through stage 4 chronic kidney disease, or unspecified chronic kidney disease: Secondary | ICD-10-CM | POA: Diagnosis not present

## 2021-06-06 DIAGNOSIS — N184 Chronic kidney disease, stage 4 (severe): Secondary | ICD-10-CM | POA: Diagnosis not present

## 2021-06-06 DIAGNOSIS — D631 Anemia in chronic kidney disease: Secondary | ICD-10-CM | POA: Diagnosis not present

## 2021-06-06 DIAGNOSIS — E1122 Type 2 diabetes mellitus with diabetic chronic kidney disease: Secondary | ICD-10-CM | POA: Diagnosis not present

## 2021-06-06 NOTE — Telephone Encounter (Signed)
Pt returned call to [facility]. Pt states to please always call her house phone and not the cell. Pt states she don't know how to use her cell. Please call home number st [phone].

## 2021-06-07 ENCOUNTER — Other Ambulatory Visit (HOSPITAL_BASED_OUTPATIENT_CLINIC_OR_DEPARTMENT_OTHER): Payer: Self-pay | Admitting: Obstetrics & Gynecology

## 2021-06-08 ENCOUNTER — Other Ambulatory Visit (HOSPITAL_COMMUNITY): Payer: Self-pay | Admitting: *Deleted

## 2021-06-08 DIAGNOSIS — I10 Essential (primary) hypertension: Secondary | ICD-10-CM | POA: Diagnosis not present

## 2021-06-08 DIAGNOSIS — I482 Chronic atrial fibrillation, unspecified: Secondary | ICD-10-CM | POA: Diagnosis not present

## 2021-06-08 DIAGNOSIS — I130 Hypertensive heart and chronic kidney disease with heart failure and stage 1 through stage 4 chronic kidney disease, or unspecified chronic kidney disease: Secondary | ICD-10-CM | POA: Diagnosis not present

## 2021-06-08 MED ORDER — FUROSEMIDE 40 MG PO TABS: ORAL_TABLET | ORAL | 3 refills | Status: DC

## 2021-06-14 ENCOUNTER — Ambulatory Visit (INDEPENDENT_AMBULATORY_CARE_PROVIDER_SITE_OTHER): Payer: Medicare HMO

## 2021-06-14 ENCOUNTER — Other Ambulatory Visit: Payer: Self-pay

## 2021-06-14 DIAGNOSIS — Z7901 Long term (current) use of anticoagulants: Secondary | ICD-10-CM | POA: Diagnosis not present

## 2021-06-14 DIAGNOSIS — Z952 Presence of prosthetic heart valve: Secondary | ICD-10-CM

## 2021-06-14 LAB — POCT INR: INR: 3.9 — AB (ref 2.0–3.0)

## 2021-06-14 NOTE — Patient Instructions (Addendum)
Description   Hold today's dose then continue taking Warfarin 1.5 tablets (7.5mg ) daily. Recheck INR in 1 week. Call [phone] if you need to change the appointment or if you have a procedure.

## 2021-06-15 ENCOUNTER — Telehealth (HOSPITAL_COMMUNITY): Payer: Self-pay | Admitting: Pharmacist

## 2021-06-15 DIAGNOSIS — N390 Urinary tract infection, site not specified: Secondary | ICD-10-CM | POA: Diagnosis not present

## 2021-06-15 MED ORDER — TADALAFIL (PAH) 20 MG PO TABS: 40.0000 mg | ORAL_TABLET | Freq: Every day | ORAL | 11 refills | Status: DC

## 2021-06-15 NOTE — Telephone Encounter (Signed)
Tadalafil refill sent to [facility].   Audry Riles, PharmD, BCPS, BCCP, CPP [facility] Pharmacist [phone]

## 2021-06-16 ENCOUNTER — Telehealth: Payer: Self-pay

## 2021-06-16 ENCOUNTER — Ambulatory Visit: Payer: Medicare HMO | Admitting: Physical Medicine and Rehabilitation

## 2021-06-16 ENCOUNTER — Ambulatory Visit: Payer: Self-pay

## 2021-06-16 ENCOUNTER — Encounter: Payer: Self-pay | Admitting: Physical Medicine and Rehabilitation

## 2021-06-16 ENCOUNTER — Other Ambulatory Visit: Payer: Self-pay

## 2021-06-16 DIAGNOSIS — M25551 Pain in right hip: Secondary | ICD-10-CM

## 2021-06-16 MED ORDER — TRIAMCINOLONE ACETONIDE 40 MG/ML IJ SUSP
60.0000 mg | INTRAMUSCULAR | Status: AC | PRN
  Administered 2021-06-16: 60 mg via INTRA_ARTICULAR

## 2021-06-16 MED ORDER — BUPIVACAINE HCL 0.25 % IJ SOLN
4.0000 mL | INTRAMUSCULAR | Status: AC | PRN
  Administered 2021-06-16: 4 mL via INTRA_ARTICULAR

## 2021-06-16 NOTE — Telephone Encounter (Signed)
Pt called and stated that they were started on cipro 500mg  qd and wanted to know if that was safe to take while on warfarin so I asked megan supple rph and she stated, "yes just have her increase greens." And that is exactly what I told her to do over the weekend and that the chst office will see her next Monday

## 2021-06-16 NOTE — Progress Notes (Signed)
Pt state right hip pain. Pt state walking and standing makes the pain worse. Pt state she takes pain meds to help ease her pain.  Numeric Pain Rating Scale and Functional Assessment Average Pain 3   In the last MONTH (on 0-10 scale) has pain interfered with the following?  1. General activity like being  able to carry out your everyday physical activities such as walking, climbing stairs, carrying groceries, or moving a chair?  Rating(8)   +Driver, -BT, -Dye Allergies.

## 2021-06-16 NOTE — Telephone Encounter (Signed)
First cipro dose being taken today, INR appt already scheduled for Monday, pt aware to eat extra greens over the weekend due to drug interaction.

## 2021-06-16 NOTE — Progress Notes (Signed)
   MARYKATHLEEN RUSSI - 80 y.o. female MRN [id number]  Date of birth: 16-Nov-1940  Office Visit Note: Visit Date: 06/16/2021 PCP: [name], MD Referred by: [name], MD  Subjective: No chief complaint on file.  HPI:  MELORA MENON is a 80 y.o. female who comes in today at the request of Dr. Jean Rosenthal for planned Right anesthetic hip arthrogram with fluoroscopic guidance.  The patient has failed conservative care including home exercise, medications, time and activity modification.  This injection will be diagnostic and hopefully therapeutic.  Please see requesting physician notes for further details and justification.   ROS Otherwise per HPI.  Assessment & Plan: Visit Diagnoses:    ICD-10-CM   1. Pain in right hip  M25.551 Large Joint Inj: R hip joint    XR C-ARM NO REPORT      Plan: No additional findings.   Meds & Orders: No orders of the defined types were placed in this encounter.   Orders Placed This Encounter  Procedures   Large Joint Inj: R hip joint   XR C-ARM NO REPORT    Follow-up: Return Jean Rosenthal, MD as scheduled.   Procedures: Large Joint Inj: R hip joint on 06/16/2021 1:28 PM Indications: diagnostic evaluation and pain Details: 22 G 3.5 in needle, fluoroscopy-guided anterior approach  Arthrogram: No  Medications: 4 mL bupivacaine 0.25 %; 60 mg triamcinolone acetonide 40 MG/ML Outcome: tolerated well, no immediate complications  There was excellent flow of contrast producing a partial arthrogram of the hip. The patient did have some relief of symptoms during the anesthetic phase of the injection.  She did appear to arise from a seated position much faster than she did prior to the injection. Procedure, treatment alternatives, risks and benefits explained, specific risks discussed. Consent was given by the patient. Immediately prior to procedure a time out was called to verify the correct patient, procedure, equipment, support  staff and site/side marked as required. Patient was prepped and draped in the usual sterile fashion.         Clinical History: No specialty comments available.     Objective:  VS:  HT:    WT:   BMI:     BP:   HR: bpm  TEMP: ( )  RESP:  Physical Exam   Imaging: XR C-ARM NO REPORT  Result Date: 06/16/2021 Please see Notes tab for imaging impression.

## 2021-06-20 ENCOUNTER — Ambulatory Visit (INDEPENDENT_AMBULATORY_CARE_PROVIDER_SITE_OTHER): Payer: Medicare HMO

## 2021-06-20 ENCOUNTER — Other Ambulatory Visit: Payer: Self-pay

## 2021-06-20 DIAGNOSIS — Z5181 Encounter for therapeutic drug level monitoring: Secondary | ICD-10-CM

## 2021-06-20 DIAGNOSIS — Z952 Presence of prosthetic heart valve: Secondary | ICD-10-CM

## 2021-06-20 DIAGNOSIS — Z7901 Long term (current) use of anticoagulants: Secondary | ICD-10-CM

## 2021-06-20 LAB — POCT INR: INR: 3.1 — AB (ref 2.0–3.0)

## 2021-06-20 NOTE — Patient Instructions (Signed)
Description   Eat greens tonight and then continue taking Warfarin 1.5 tablets (7.5mg ) daily. Recheck INR in 3 weeks. Call [phone] if you need to change the appointment or if you have a procedure. Please call clinic if you start taking antibiotics - [phone]

## 2021-06-21 ENCOUNTER — Inpatient Hospital Stay: Payer: Medicare HMO

## 2021-06-21 ENCOUNTER — Emergency Department (HOSPITAL_BASED_OUTPATIENT_CLINIC_OR_DEPARTMENT_OTHER): Payer: Medicare HMO

## 2021-06-21 ENCOUNTER — Inpatient Hospital Stay: Payer: Medicare HMO | Admitting: Oncology

## 2021-06-21 ENCOUNTER — Other Ambulatory Visit: Payer: Self-pay

## 2021-06-21 ENCOUNTER — Inpatient Hospital Stay: Payer: Medicare HMO | Attending: Oncology

## 2021-06-21 ENCOUNTER — Encounter (HOSPITAL_BASED_OUTPATIENT_CLINIC_OR_DEPARTMENT_OTHER): Payer: Self-pay

## 2021-06-21 ENCOUNTER — Telehealth: Payer: Self-pay | Admitting: *Deleted

## 2021-06-21 VITALS — BP 182/66 | HR 60 | Temp 97.7°F | Resp 16 | Ht <= 58 in | Wt 125.9 lb

## 2021-06-21 VITALS — BP 216/47 | HR 56 | Resp 16

## 2021-06-21 DIAGNOSIS — Z955 Presence of coronary angioplasty implant and graft: Secondary | ICD-10-CM | POA: Insufficient documentation

## 2021-06-21 DIAGNOSIS — D61818 Other pancytopenia: Secondary | ICD-10-CM | POA: Diagnosis not present

## 2021-06-21 DIAGNOSIS — I776 Arteritis, unspecified: Secondary | ICD-10-CM | POA: Diagnosis not present

## 2021-06-21 DIAGNOSIS — D696 Thrombocytopenia, unspecified: Secondary | ICD-10-CM

## 2021-06-21 DIAGNOSIS — D500 Iron deficiency anemia secondary to blood loss (chronic): Secondary | ICD-10-CM | POA: Diagnosis not present

## 2021-06-21 DIAGNOSIS — I129 Hypertensive chronic kidney disease with stage 1 through stage 4 chronic kidney disease, or unspecified chronic kidney disease: Secondary | ICD-10-CM | POA: Insufficient documentation

## 2021-06-21 DIAGNOSIS — N289 Disorder of kidney and ureter, unspecified: Secondary | ICD-10-CM

## 2021-06-21 DIAGNOSIS — E119 Type 2 diabetes mellitus without complications: Secondary | ICD-10-CM | POA: Insufficient documentation

## 2021-06-21 DIAGNOSIS — D631 Anemia in chronic kidney disease: Secondary | ICD-10-CM | POA: Diagnosis not present

## 2021-06-21 DIAGNOSIS — N184 Chronic kidney disease, stage 4 (severe): Secondary | ICD-10-CM | POA: Insufficient documentation

## 2021-06-21 DIAGNOSIS — I739 Peripheral vascular disease, unspecified: Secondary | ICD-10-CM

## 2021-06-21 DIAGNOSIS — Z87891 Personal history of nicotine dependence: Secondary | ICD-10-CM | POA: Diagnosis not present

## 2021-06-21 DIAGNOSIS — D508 Other iron deficiency anemias: Secondary | ICD-10-CM

## 2021-06-21 DIAGNOSIS — D594 Other nonautoimmune hemolytic anemias: Secondary | ICD-10-CM

## 2021-06-21 DIAGNOSIS — R519 Headache, unspecified: Secondary | ICD-10-CM | POA: Diagnosis not present

## 2021-06-21 DIAGNOSIS — I7782 Antineutrophilic cytoplasmic antibody (ANCA) vasculitis: Secondary | ICD-10-CM

## 2021-06-21 DIAGNOSIS — I10 Essential (primary) hypertension: Secondary | ICD-10-CM | POA: Insufficient documentation

## 2021-06-21 DIAGNOSIS — I2510 Atherosclerotic heart disease of native coronary artery without angina pectoris: Secondary | ICD-10-CM | POA: Insufficient documentation

## 2021-06-21 DIAGNOSIS — Z952 Presence of prosthetic heart valve: Secondary | ICD-10-CM

## 2021-06-21 LAB — CBC WITH DIFFERENTIAL/PLATELET
Abs Immature Granulocytes: 0.07 10*3/uL (ref 0.00–0.07)
Basophils Absolute: 0 10*3/uL (ref 0.0–0.1)
Basophils Relative: 1 %
Eosinophils Absolute: 0.2 10*3/uL (ref 0.0–0.5)
Eosinophils Relative: 3 %
HCT: 31.4 % — ABNORMAL LOW (ref 36.0–46.0)
Hemoglobin: 10.4 g/dL — ABNORMAL LOW (ref 12.0–15.0)
Immature Granulocytes: 1 %
Lymphocytes Relative: 10 %
Lymphs Abs: 0.8 10*3/uL (ref 0.7–4.0)
MCH: 31 pg (ref 26.0–34.0)
MCHC: 33.1 g/dL (ref 30.0–36.0)
MCV: 93.5 fL (ref 80.0–100.0)
Monocytes Absolute: 0.5 10*3/uL (ref 0.1–1.0)
Monocytes Relative: 6 %
Neutro Abs: 6.3 10*3/uL (ref 1.7–7.7)
Neutrophils Relative %: 79 %
Platelets: 156 10*3/uL (ref 150–400)
RBC: 3.36 MIL/uL — ABNORMAL LOW (ref 3.87–5.11)
RDW: 14.9 % (ref 11.5–15.5)
WBC: 7.9 10*3/uL (ref 4.0–10.5)
nRBC: 0 % (ref 0.0–0.2)

## 2021-06-21 LAB — SAVE SMEAR(SSMR), FOR PROVIDER SLIDE REVIEW

## 2021-06-21 LAB — RETICULOCYTES
Immature Retic Fract: 7.7 % (ref 2.3–15.9)
RBC.: 3.32 MIL/uL — ABNORMAL LOW (ref 3.87–5.11)
Retic Count, Absolute: 55.8 10*3/uL (ref 19.0–186.0)
Retic Ct Pct: 1.7 % (ref 0.4–3.1)

## 2021-06-21 LAB — FERRITIN: Ferritin: 228 ng/mL (ref 11–307)

## 2021-06-21 MED ORDER — SODIUM CHLORIDE 0.9 % IV SOLN: Freq: Once | INTRAVENOUS | Status: AC

## 2021-06-21 MED ORDER — EPOETIN ALFA-EPBX 40000 UNIT/ML IJ SOLN
40000.0000 [IU] | Freq: Once | INTRAMUSCULAR | Status: AC
  Administered 2021-06-21: 40000 [IU] via SUBCUTANEOUS
  Filled 2021-06-21: qty 1

## 2021-06-21 MED ORDER — DIPHENHYDRAMINE HCL 25 MG PO CAPS: 25.0000 mg | ORAL_CAPSULE | Freq: Once | ORAL | Status: AC

## 2021-06-21 MED ORDER — ACETAMINOPHEN 325 MG PO TABS
ORAL_TABLET | ORAL | Status: AC
  Administered 2021-06-21: 650 mg via ORAL
  Filled 2021-06-21: qty 2

## 2021-06-21 MED ORDER — DIPHENHYDRAMINE HCL 25 MG PO CAPS
ORAL_CAPSULE | ORAL | Status: AC
  Administered 2021-06-21: 25 mg via ORAL
  Filled 2021-06-21: qty 1

## 2021-06-21 MED ORDER — EPOETIN ALFA-EPBX 20000 UNIT/ML IJ SOLN
20000.0000 [IU] | Freq: Once | INTRAMUSCULAR | Status: AC
  Administered 2021-06-21: 20000 [IU] via SUBCUTANEOUS
  Filled 2021-06-21: qty 1

## 2021-06-21 MED ORDER — SODIUM CHLORIDE 0.9 % IV SOLN
375.0000 mg/m2 | Freq: Once | INTRAVENOUS | Status: AC
  Administered 2021-06-21: 600 mg via INTRAVENOUS
  Filled 2021-06-21: qty 50

## 2021-06-21 MED ORDER — ACETAMINOPHEN 325 MG PO TABS: 650.0000 mg | ORAL_TABLET | Freq: Once | ORAL | Status: AC

## 2021-06-21 NOTE — Progress Notes (Signed)
Pt instructed to monitor her BP at home.  If her BP continues to be elevated, I instructed her to go to the ER. I educated her on the risk of heart attack and stroke with high BP.  Pt verbalizes understanding.

## 2021-06-21 NOTE — Telephone Encounter (Signed)
Per MD review of noted elevated B/P - recommended for pt to proceed with EPO injection and she needs to take BP medications.

## 2021-06-21 NOTE — ED Triage Notes (Signed)
Patient here POV from Home with Hypertension and Headache.  Patient was at the MD today and her BP was high and was instructed to check again once she returned Home and it was 218/99. Patient has been having a Headache throughout the day since this Afternoon.  BIB Wheelchair. NAD Noted during Triage. A&Ox4. GCS 15. No Neurological Deficits noted during Triage.

## 2021-06-21 NOTE — Progress Notes (Addendum)
[facility]  [phone] [phone]    ID: MARCEDES TECH DOB: Jul 07, 1941  MR#: [id number]  GMW#:[id number]  Patient Care Team: [name], MD as PCP - General (Internal Medicine) [name], MD as PCP - Advanced Heart Failure (Cardiology) Lorretta Harp, MD as PCP - Cardiology (Cardiology) Damen Windsor, [name], MD as Consulting Physician (Hematology and Oncology) Arta Silence, MD as Consulting Physician (Gastroenterology) [name], MD as Consulting Physician (Cardiology) Marzetta Board, DPM as Consulting Physician (Podiatry) Gavin Pound, MD as Consulting Physician (Rheumatology) Jon Billings, RN as [place] Management Corliss Parish, MD as Consulting Physician (Nephrology) Mcarthur Rossetti, MD as Consulting Physician (Orthopedic Surgery) OTHER MD:   CHIEF COMPLAINT: anemia of renal failure; vasculitis  CURRENT TREATMENT: Retacrit, rituximab   INTERVAL HISTORY: Nikolina returns today for follow-up and treatment of her anemia of renal failure and vasculitis.    She receives Retacrit every 4 weeks.  She tolerates this well with no side effects that she is aware of.  She of course has been advised regarding risk of clotting with excessive increase in her hemoglobin.  The goal here is to have hemoglobin between 10 and 11.  She is now receiving rituximab every 12 weeks.  She tolerates these treatments remarkably well, with no side effects from the rituximab that she is aware of.  She has a dose due today  We are following her hemoglobin at present: Lab Results  Component Value Date   HGB 10.4 (L) 06/21/2021   HGB 9.2 (L) 05/24/2021   HGB 9.7 (L) 04/26/2021   HGB 9.0 (L) 03/29/2021   HGB 9.4 (L) 03/01/2021   She is scheduled for screening mammography on 06/30/2021.   REVIEW OF SYSTEMS: Areona tells me her right hip is a real problem.  Dr. Ninfa Linden has told her she needs  replacement but she says "everybody is afraid of doing surgery" and that seems reasonable to me.  She did just have an infection but "it did not work".  She tells me her cardiologist has increased her Lasix and its "too much".  She is going to call him and discuss and she is also going to discuss it with her renal physician Dr. [name].  She tells me her bowel movements are much better since she went off the metformin and that she had a good hemoglobin A1c (6.1) last time it was checked.  She tells me that she cannot 58 jars of green beans in the last 2 months.  A detailed review of systems was otherwise stable.   COVID 19 VACCINATION STATUS: Has received [place] x2  with booster December 2021     HISTORY OF CURRENT ILLNESS: From the original consult note:  SOLA MARGOLIS is an 80 y.o. female from [place], [place].  She has a past medical history significant for PAD s/p stenting; moderate to severe pulmonary HTN; afib and AVR on Coumadin; HTN; HLD; DM; and CAD s/p CABG. the patient was admitted to the hospital for anemia.  She was having increasing fatigue and shortness of breath and had routine lab work drawn which showed a hemoglobin of 6.6.  Due to her abnormal lab work, she was advised to come to the emergency room for admission.  She was also admitted in February 2020 for symptomatic anemia.  She reports she has been anemic for at least a year and has been taking oral iron.  She underwent a colonoscopy and EGD on 01/22/2018.  The colonoscopy showed diverticulosis in the sigmoid colon and descending colon, internal hemorrhoids.  The upper endoscopy was normal except for erythematous mucosa in the antrum.  She had a repeat EGD on 11/21/2018 that showed a few minimally oozing( with scope trauma) superficial gastric ulcers with pigmented material were found in the gastric body. The largest lesion was 4 mm in largest dimension.  She underwent another upper endoscopy on 02/27/2019 which showed  patchy mildly friable mucosa with contact bleeding was found in the gastric fundus, in the gastric body and in the gastric antrum.  The patient is maintained on warfarin due to A. fib and a mechanical aortic valve.  Review of the patient's chart shows that she has been anemic dating back to at least December thousand 12 (these are the earliest labs available to me).  Her highest hemoglobin was 11.6 in December 2012.  I also note that the patient has had intermittent mild leukopenia adding back to at least April 2019.  Her lowest white blood cell count was 2.6 in April 2019.   Since admission, the patient has received 1 unit of packed red blood cells.  She is currently on heparin for her procedure with plan to bridge back to Coumadin.  Stool for occult blood was negative x1 on 02/24/2019.  Hematology was asked see the patient to make recommendations regarding her anemia.  [name] subsequent history is as detailed below.   PAST MEDICAL HISTORY: Past Medical History:  Diagnosis Date   Aortic atherosclerosis ([place]) 01/23/2018   Atrial fibrillation ([facility])    Carotid artery disease ([facility])    Cholelithiasis 01/23/2018   Chronic anticoagulation    Coronary artery disease    status post coronary artery bypass grafting times 07/10/2004   Diabetes mellitus    Hypercholesteremia    Hypertension    Mechanical heart valve present    H. aortic valve replacement at the time of bypass surgery October 2005   Moderate to severe pulmonary hypertension ([facility])    Peripheral arterial disease ([facility])    history of left common iliac artery PTA and stenting for a chronic total occlusion 08/26/01    PAST SURGICAL HISTORY: Past Surgical History:  Procedure Laterality Date   anterior repair  2009   AORTIC VALVE REPLACEMENT  2005   Dr. Cyndia Bent   CARDIAC CATHETERIZATION  11/10/2004   40% right common illiac, 70% in stent restenosis of distal left common illiac,    CARDIAC CATHETERIZATION  05/18/2004   LAD 50-70%  midstenosis, RCA dominant w/50% stenosis, 50% Right common Illiac artery ostial stenosis, 90% in stent restenosis within midportion of left common illiac stent   Carotid Duplex  03/12/2012   RSA-elev. velocities suggestive of a 50-69% diameter reduction, Right&Left Bulb/Prox ICA-mild-mod.fibrous plaqueelevating Velocities abnormal study.   CHOLECYSTECTOMY N/A 03/01/2018   Procedure: LAPAROSCOPIC CHOLECYSTECTOMY;  Surgeon: Judeth Horn, MD;  Location: [facility];  Service: General;  Laterality: N/A;   COLONOSCOPY WITH PROPOFOL N/A 01/22/2018   Procedure: COLONOSCOPY WITH PROPOFOL;  Surgeon: Wilford Corner, MD;  Location: [facility];  Service: Endoscopy;  Laterality: N/A;   ESOPHAGOGASTRODUODENOSCOPY (EGD) WITH PROPOFOL N/A 01/22/2018   Procedure: ESOPHAGOGASTRODUODENOSCOPY (EGD) WITH PROPOFOL;  Surgeon: Wilford Corner, MD;  Location: [facility];  Service: Endoscopy;  Laterality: N/A;   ESOPHAGOGASTRODUODENOSCOPY (EGD) WITH PROPOFOL N/A 11/21/2018   Procedure: ESOPHAGOGASTRODUODENOSCOPY (EGD) WITH PROPOFOL;  Surgeon: [name], MD;  Location: [facility];  Service: Gastroenterology;  Laterality: N/A;   ESOPHAGOGASTRODUODENOSCOPY (EGD) WITH PROPOFOL Left 02/27/2019   Procedure: ESOPHAGOGASTRODUODENOSCOPY (  EGD) WITH PROPOFOL;  Surgeon: Arta Silence, MD;  Location: [facility];  Service: Endoscopy;  Laterality: Left;   GIVENS CAPSULE STUDY N/A 11/21/2018   Procedure: GIVENS CAPSULE STUDY;  Surgeon: [name], MD;  Location: [facility];  Service: Gastroenterology;  Laterality: N/A;  To be deployed during EGD   Lower Ext. Duplex  03/12/2012   Right Proximal CIA- vessel narrowing w/elevated velocities 0-49% diameter reduction. Right SFA-mild mixed density plaque throughout vessel.   NM MYOCAR PERF WALL MOTION  05/19/2010   protocol: Persantine, post stress EF 65%, negative for ischemia, low risk scan   RIGHT HEART CATH N/A 06/27/2018   Procedure: RIGHT HEART CATH;  Surgeon: [name], MD;   Location: [facility];  Service: Cardiovascular;  Laterality: N/A;   TOTAL ABDOMINAL HYSTERECTOMY W/ BILATERAL SALPINGOOPHORECTOMY  1989   TRANSTHORACIC ECHOCARDIOGRAM  08/29/2012   Moderately calcified annulus of mitral valve, moderate regurg. of both mitral valve and tricuspid valve.     FAMILY HISTORY: Family History  Problem Relation Age of Onset   Breast cancer Neg Hx     GYNECOLOGIC HISTORY:  No LMP recorded. Patient has had a hysterectomy. Menarche:  years old Age at first live birth:  [place] P: 3 LMP:  Contraceptive:  HRT:   Hysterectomy?: yes BSO?:    SOCIAL HISTORY: (Current as of 03/07/2019) Shiesha is widowed.  She lives by herself.  She has 3 children that live locally.  She smoked three quarters of a pack of cigarettes daily for 50 years.  She quit in 2005.  Denies alcohol use.   ADVANCED DIRECTIVES: Not in place    HEALTH MAINTENANCE: Social History   Tobacco Use   Smoking status: Former    Packs/day: 1.00    Years: 30.00    Pack years: 30.00    Types: Cigarettes   Smokeless tobacco: Never   Tobacco comments:    quit smoking 2005  Vaping Use   Vaping Use: Never used  Substance Use Topics   Alcohol use: No    Alcohol/week: 0.0 standard drinks   Drug use: No     Colonoscopy: EGD on 02/27/2019 under Dr. Paulita Fujita found some friable gastric mucosa.  Colonoscopy under Dr. Michail Sermon 01/22/2018 showed significant diverticular disease  PAP:   Bone density: May 2017 showed osteopenia   Allergies  Allergen Reactions   Flagyl [Metronidazole] Rash    ALL-OVER BODY RASH   Ciprofloxacin     Other reaction(s): Stomach upset - tolerates   Coreg [Carvedilol] Other (See Comments)    Terrible cramping in the feet and had a lot of bowel movements, but not diarrhea   Losartan Swelling    Patient doesn't recall site of swelling   Sulfamethoxazole-Trimethoprim     Other reaction(s): stomach upset .   Verapamil Hives   Zetia [Ezetimibe] Other (See Comments)     Reaction not recalled   Zocor [Simvastatin - High Dose] Other (See Comments)    Reaction not recalled   Gatifloxacin Rash    Redness to skin around eye    Current Outpatient Medications  Medication Sig Dispense Refill   acetaminophen (TYLENOL) 325 MG tablet Take 650 mg by mouth every 6 (six) hours as needed for mild pain.      allopurinol (ZYLOPRIM) 100 MG tablet Take 100 mg by mouth daily.     ALPRAZolam (XANAX) 0.5 MG tablet Take 0.25 mg by mouth at bedtime.     amLODipine (NORVASC) 10 MG tablet TAKE 1 TABLET EVERY  DAY 90 tablet 3   atorvastatin (LIPITOR) 80 MG tablet Take 1 tablet (80 mg total) by mouth daily at 6 PM.     Cholecalciferol (VITAMIN D3) 1000 UNITS CAPS Take 2,000 Units by mouth daily with lunch.      cloNIDine (CATAPRES) 0.2 MG tablet TAKE 1 TABLET TWICE DAILY 180 tablet 3   Cyanocobalamin (VITAMIN B12) 1000 MCG TBCR Take 1,000 mcg by mouth daily with lunch.     furosemide (LASIX) 40 MG tablet Take 2 tablets (80 mg total) by mouth in the morning AND 1.5 tablets (60 mg total) every evening. 120 tablet 3   hydrALAZINE (APRESOLINE) 100 MG tablet TAKE 1 TABLET THREE TIMES DAILY 270 tablet 3   lactose free nutrition (BOOST) LIQD Take 237 mLs by mouth daily. (To supplement meals)      magnesium oxide (MAG-OX) 400 (241.3 Mg) MG tablet Take 1 tablet (400 mg total) by mouth daily.     metoprolol succinate (TOPROL XL) 25 MG 24 hr tablet Take 0.5 tablets (12.5 mg total) by mouth daily. 45 tablet 3   pantoprazole (PROTONIX) 40 MG tablet Take 40 mg by mouth 2 (two) times daily.      spironolactone (ALDACTONE) 25 MG tablet TAKE 1 TABLET EVERY DAY 90 tablet 3   tadalafil, PAH, (ADCIRCA) 20 MG tablet Take 2 tablets (40 mg total) by mouth daily. 60 tablet 11   vitamin C (ASCORBIC ACID) 500 MG tablet Take 500 mg by mouth daily with lunch.     warfarin (COUMADIN) 5 MG tablet TAKE 1 AND 1/2 TABLETS TO 2 TABLETS DAILY AS DIRECTED BY COUMADIN CLINIC 180 tablet 0   No current  facility-administered medications for this visit.    OBJECTIVE: White woman who appears stated age 10:   06/21/21 1247  BP: (!) 182/66  Pulse: 60  Resp: 16  Temp: 97.7 F (36.5 C)  SpO2: 98%   Wt Readings from Last 3 Encounters:  06/21/21 125 lb (56.7 kg)  06/21/21 125 lb 14.4 oz (57.1 kg)  05/31/21 129 lb (58.5 kg)   Body mass index is 26.31 kg/m.    ECOG FS:1 - Symptomatic but completely ambulatory  Sclerae unicteric, EOMs intact Wearing a mask No cervical or supraclavicular adenopathy Lungs no rales or rhonchi Heart regular rate and rhythm Abd soft, nontender, positive bowel sounds MSK no focal spinal tenderness Neuro: nonfocal, well oriented, appropriate affect Breasts: Deferred   LAB RESULTS:  CMP     Component Value Date/Time   NA 134 (L) 05/24/2021 1414   NA 137 12/06/2017 1436   K 4.4 05/24/2021 1414   CL 104 05/24/2021 1414   CO2 20 (L) 05/24/2021 1414   GLUCOSE 195 (H) 05/24/2021 1414   BUN 84 (H) 05/24/2021 1414   BUN 20 12/06/2017 1436   CREATININE 2.50 (H) 05/24/2021 1414   CALCIUM 9.2 05/24/2021 1414   PROT 6.4 (L) 05/24/2021 1414   ALBUMIN 3.7 05/24/2021 1414   AST 20 05/24/2021 1414   ALT 14 05/24/2021 1414   ALKPHOS 91 05/24/2021 1414   BILITOT 0.4 05/24/2021 1414   GFRNONAA 19 (L) 05/24/2021 1414   GFRAA 32 (L) 03/30/2020 0932    Lab Results  Component Value Date   TOTALPROTELP 6.9 05/07/2019    No results found for: KPAFRELGTCHN, LAMBDASER, KAPLAMBRATIO  Lab Results  Component Value Date   WBC 7.9 06/21/2021   NEUTROABS 6.3 06/21/2021   HGB 10.4 (L) 06/21/2021   HCT 31.4 (L) 06/21/2021   MCV 93.5  06/21/2021   PLT 156 06/21/2021    No results found for: LABCA2  No components found for: [id number]  Recent Labs  Lab 06/20/21 1152  INR 3.1*    No results found for: LABCA2  No results found for: DGU440  No results found for: HKV425  No results found for: ZDG387  No results found for: CA2729  No components  found for: HGQUANT  No results found for: CEA1 / No results found for: CEA1   No results found for: AFPTUMOR  No results found for: CHROMOGRNA  No results found for: HGBA, HGBA2QUANT, HGBFQUANT, HGBSQUAN (Hemoglobinopathy evaluation)   Lab Results  Component Value Date   LDH 212 (H) 08/04/2019    Lab Results  Component Value Date   IRON 123 03/30/2020   TIBC 268 03/30/2020   IRONPCTSAT 46 03/30/2020   (Iron and TIBC)  Lab Results  Component Value Date   FERRITIN 228 06/21/2021    Urinalysis    Component Value Date/Time   COLORURINE YELLOW 07/07/2019 0209   APPEARANCEUR CLOUDY (A) 07/07/2019 0209   LABSPEC 1.011 07/07/2019 0209   PHURINE 6.0 07/07/2019 0209   GLUCOSEU NEGATIVE 07/07/2019 0209   HGBUR NEGATIVE 07/07/2019 0209   BILIRUBINUR NEGATIVE 07/07/2019 0209   KETONESUR NEGATIVE 07/07/2019 0209   PROTEINUR >=300 (A) 07/07/2019 0209   UROBILINOGEN 1.0 10/06/2011 1709   NITRITE NEGATIVE 07/07/2019 0209   LEUKOCYTESUR LARGE (A) 07/07/2019 0209    STUDIES:  CT HEAD WO CONTRAST (5MM)  Result Date: 06/21/2021 CLINICAL DATA:  Headache.  Hypertension. EXAM: CT HEAD WITHOUT CONTRAST TECHNIQUE: Contiguous axial images were obtained from the base of the skull through the vertex without intravenous contrast. COMPARISON:  CT head 10/06/2011. FINDINGS: Brain: No evidence of acute infarction, hemorrhage, hydrocephalus, extra-axial collection or mass lesion/mass effect. Vascular: No hyperdense vessel or unexpected calcification. Skull: Normal. Negative for fracture or focal lesion. Sinuses/Orbits: No acute finding. Other: None. IMPRESSION: No acute intracranial abnormality. Electronically Signed   By: Ronney Asters M.D.   On: 06/21/2021 22:21   XR C-ARM NO REPORT  Result Date: 06/16/2021 Please see Notes tab for imaging impression.  XR HIP UNILAT W OR W/O PELVIS 2-3 VIEWS LEFT  Result Date: 05/31/2021 An AP pelvis and lateral left hip shows minimal arthritic changes and a  well-maintained hip joint.  XR HIP UNILAT W OR W/O PELVIS 2-3 VIEWS RIGHT  Result Date: 05/31/2021 An AP pelvis and lateral the right hip shows severe end-stage arthritis of the right hip with complete loss of the joint space.  There are para-articular osteophytes as well as sclerotic changes in the femoral head and acetabulum.    ELIGIBLE FOR AVAILABLE RESEARCH PROTOCOL: no   ASSESSMENT: 80 y.o.  woman with multifactorial anemia, as follows:             (a) anemia of chronic inflammation: As of May 2020 she has a SED rate of 95, CRP 1.1, positive ANA and RF; subsequently she was found to be ANCA positive             (b) hemolysis (mild): she has a mildly elevated LDH and a DAT positive for complement, not IgG; this is c/w (a) above             (c) anemia of renal insufficiency: inadequate EPO resonse to anemia              (d) GIB/ iron deficiency in patient on lifelong anticoagulaton  (1) Feraheme received 03/01/2019, repeated 03/20/2019   (  a) reticulocyte 03/07/2019 up from 43.4 a year ago to 191.8 after iron infusion  (b) feraheme repeated on 05/12/2019  (c) subsequent ferritin levels >100  (2) darbepoetin: starting 05/05/2019 at 120mcg given every 2 weeks  (a) dose increased to 200 mcg every week starting 07/01/2019   (b) back to every 2 weeks beginning 08/04/2019 still at 200 mcg dose  (c) switched to Retacrit every 4 weeks as of 08/18/2019  (3) ANCA positive vasculitis: diagnosed 05/08/2019 (titer 1:640)  (a) prednisone 40 mg daily started 05/31/2019  (b) rituximab weekly started 06/12/2019 (received test dose 06/05/2019)   (i) hepatitis B sAg, core Ab and HIV negative 05/08/2019   (ii) rituximab held after 07/14/2019 dose (received 5 weekly doses) with neutropenia   (iii) rituximab resumed 08/18/2019 but changed to monthly   (iv) rituximab changed to every 2 months beginning with 02/02/2020 dose   (v) rituximab changed to every 12 weeks after the 10/12/2020  dose  (c) prednisone tapered to off as of 09/15/2019  (4) osteopenia with a T score of -1.5 on bone density 02/28/2016 at the breast center    PLAN: Nataline continues to have a good response to the right toxin Mab and the erythropoietin.  I am not changing dose.  She will receive EPO every 4 weeks and will receive her next Rituxan dose 3 months from now.  She has questions regarding her Lasix dose.  She will discuss that with her cardiologist and her renal physician.  She is having difficulty ambulating.  She is being helped in that regard by Dr. Ninfa Linden.  At this point I am delighted at how well she is doing from a hematology point of view.  She will see me again 3 months from now with her next rituximab dose.  Total encounter time 20 minutes.Sarajane Jews C. Winnifred Dufford, MD 06/23/21 9:53 PM Medical Oncology and Hematology [facility] [place], [place] [address] Tel. [phone]    Fax. [phone]   I, Wilburn Mylar, am acting as scribe for Dr. [name]. Makalyn Lennox.  I, Lurline Del MD, have reviewed the above documentation for accuracy and completeness, and I agree with the above.   *Total Encounter Time as defined by the Centers for Medicare and Medicaid Services includes, in addition to the face-to-face time of a patient visit (documented in the note above) non-face-to-face time: obtaining and reviewing outside history, ordering and reviewing medications, tests or procedures, care coordination (communications with other health care professionals or caregivers) and documentation in the medical record.

## 2021-06-22 ENCOUNTER — Other Ambulatory Visit (HOSPITAL_COMMUNITY): Payer: Self-pay | Admitting: Cardiology

## 2021-06-22 ENCOUNTER — Emergency Department (HOSPITAL_BASED_OUTPATIENT_CLINIC_OR_DEPARTMENT_OTHER)
Admission: EM | Admit: 2021-06-22 | Discharge: 2021-06-22 | Disposition: A | Discharge status: Home / Self Care | Payer: Medicare HMO | Attending: Emergency Medicine | Admitting: Emergency Medicine

## 2021-06-22 DIAGNOSIS — I10 Essential (primary) hypertension: Secondary | ICD-10-CM | POA: Diagnosis not present

## 2021-06-22 NOTE — Discharge Instructions (Addendum)
You were evaluated in the Emergency Department and after careful evaluation, we did not find any emergent condition requiring admission or further testing in the hospital.  Your exam/testing today was overall reassuring.  CT scan without any abnormalities or emergencies.  Recommend close follow-up with your primary care doctor and cardiologist regarding your blood pressure.  Please return to the Emergency Department if you experience any worsening of your condition.  Thank you for allowing [place] to be a part of your care.

## 2021-06-22 NOTE — ED Provider Notes (Signed)
[facility] Emergency Department Provider Note MRN:  [id number]  Arrival date & time: 06/22/21     Chief Complaint   Hypertension and Headache   History of Present Illness   [name] is a 80 y.o. year-old female with a history of coronary artery disease, A. fib, diabetes, mechanical heart valve presenting to the ED with chief complaint of potential.  Patient found to be hypertensive today at doctor's office, advised to come to the emergency department.  Having a dull frontal headache today as well, not out of the ordinary, not severe.  Denies any nausea vomiting, no chest pain or shortness of breath, no abdominal pain, no neck or back pain, no numbness or weakness to the arms or legs.  Taken off her amlodipine 1 week ago.  Review of Systems  A complete 10 system review of systems was obtained and all systems are negative except as noted in the HPI and PMH.   Patient's Health History    Past Medical History:  Diagnosis Date   Aortic atherosclerosis ([place]) 01/23/2018   Atrial fibrillation (HCC)    Carotid artery disease ([place])    Cholelithiasis 01/23/2018   Chronic anticoagulation    Coronary artery disease    status post coronary artery bypass grafting times 07/10/2004   Diabetes mellitus    Hypercholesteremia    Hypertension    Mechanical heart valve present    H. aortic valve replacement at the time of bypass surgery October 2005   Moderate to severe pulmonary hypertension ([place])    Peripheral arterial disease ([place])    history of left common iliac artery PTA and stenting for a chronic total occlusion 08/26/01    Past Surgical History:  Procedure Laterality Date   anterior repair  2009   AORTIC VALVE REPLACEMENT  2005   Dr. Cyndia Bent   CARDIAC CATHETERIZATION  11/10/2004   40% right common illiac, 70% in stent restenosis of distal left common illiac,    CARDIAC CATHETERIZATION  05/18/2004   LAD 50-70% midstenosis, RCA dominant w/50% stenosis, 50%  Right common Illiac artery ostial stenosis, 90% in stent restenosis within midportion of left common illiac stent   Carotid Duplex  03/12/2012   RSA-elev. velocities suggestive of a 50-69% diameter reduction, Right&Left Bulb/Prox ICA-mild-mod.fibrous plaqueelevating Velocities abnormal study.   CHOLECYSTECTOMY N/A 03/01/2018   Procedure: LAPAROSCOPIC CHOLECYSTECTOMY;  Surgeon: Judeth Horn, MD;  Location: [place];  Service: General;  Laterality: N/A;   COLONOSCOPY WITH PROPOFOL N/A 01/22/2018   Procedure: COLONOSCOPY WITH PROPOFOL;  Surgeon: Wilford Corner, MD;  Location: [place];  Service: Endoscopy;  Laterality: N/A;   ESOPHAGOGASTRODUODENOSCOPY (EGD) WITH PROPOFOL N/A 01/22/2018   Procedure: ESOPHAGOGASTRODUODENOSCOPY (EGD) WITH PROPOFOL;  Surgeon: Wilford Corner, MD;  Location: [place];  Service: Endoscopy;  Laterality: N/A;   ESOPHAGOGASTRODUODENOSCOPY (EGD) WITH PROPOFOL N/A 11/21/2018   Procedure: ESOPHAGOGASTRODUODENOSCOPY (EGD) WITH PROPOFOL;  Surgeon: [name], MD;  Location: [place];  Service: Gastroenterology;  Laterality: N/A;   ESOPHAGOGASTRODUODENOSCOPY (EGD) WITH PROPOFOL Left 02/27/2019   Procedure: ESOPHAGOGASTRODUODENOSCOPY (EGD) WITH PROPOFOL;  Surgeon: Arta Silence, MD;  Location: [facility] ENDOSCOPY;  Service: Endoscopy;  Laterality: Left;   GIVENS CAPSULE STUDY N/A 11/21/2018   Procedure: GIVENS CAPSULE STUDY;  Surgeon: [name], MD;  Location: [place];  Service: Gastroenterology;  Laterality: N/A;  To be deployed during EGD   Lower Ext. Duplex  03/12/2012   Right Proximal CIA- vessel narrowing w/elevated velocities 0-49% diameter reduction. Right SFA-mild mixed density plaque throughout vessel.  NM MYOCAR PERF WALL MOTION  05/19/2010   protocol: Persantine, post stress EF 65%, negative for ischemia, low risk scan   RIGHT HEART CATH N/A 06/27/2018   Procedure: RIGHT HEART CATH;  Surgeon: [name], MD;  Location: [place] [facility];  Service:  Cardiovascular;  Laterality: N/A;   TOTAL ABDOMINAL HYSTERECTOMY W/ BILATERAL SALPINGOOPHORECTOMY  1989   TRANSTHORACIC ECHOCARDIOGRAM  08/29/2012   Moderately calcified annulus of mitral valve, moderate regurg. of both mitral valve and tricuspid valve.     Family History  Problem Relation Age of Onset   Breast cancer Neg Hx     Social History   Socioeconomic History   Marital status: Widowed    Spouse name: Not on file   Number of children: Not on file   Years of education: Not on file   Highest education level: Not on file  Occupational History   Occupation: Retired  Tobacco Use   Smoking status: Former    Packs/day: 1.00    Years: 30.00    Pack years: 30.00    Types: Cigarettes   Smokeless tobacco: Never   Tobacco comments:    quit smoking 2005  Vaping Use   Vaping Use: Never used  Substance and Sexual Activity   Alcohol use: No    Alcohol/week: 0.0 standard drinks   Drug use: No   Sexual activity: Not Currently    Birth control/protection: None  Other Topics Concern   Not on file  Social History Narrative   Not on file   Social Determinants of Health   Financial Resource Strain: Not on file  Food Insecurity: Not on file  Transportation Needs: No Transportation Needs   Lack of Transportation (Medical): No   Lack of Transportation (Non-Medical): No  Physical Activity: Not on file  Stress: Not on file  Social Connections: Not on file  Intimate Partner Violence: Not on file     Physical Exam   Vitals:   06/21/21 2202 06/22/21 0015  BP: (!) 216/53 (!) 228/59  Pulse: 61 69  Resp: 16 16  Temp:    SpO2: 98% 98%    CONSTITUTIONAL: Well-appearing, NAD NEURO:  Alert and oriented x 3, no focal deficits EYES:  eyes equal and reactive ENT/NECK:  no LAD, no JVD CARDIO: Regular rate, well-perfused, normal S1 and S2 PULM:  CTAB no wheezing or rhonchi GI/GU:  normal bowel sounds, non-distended, non-tender MSK/SPINE:  No gross deformities, no edema SKIN:  no  rash, atraumatic PSYCH:  Appropriate speech and behavior  *Additional and/or pertinent findings included in MDM below  Diagnostic and Interventional Summary    EKG Interpretation  Date/Time:    Ventricular Rate:    PR Interval:    QRS Duration:   QT Interval:    QTC Calculation:   R Axis:     Text Interpretation:         Labs Reviewed - No data to display  CT HEAD WO CONTRAST (5MM)  Final Result      Medications - No data to display   Procedures  /  Critical Care Procedures  ED Course and Medical Decision Making  I have reviewed the triage vital signs, the nursing notes, and pertinent available records from the EMR.  Listed above are laboratory and imaging tests that I personally ordered, reviewed, and interpreted and then considered in my medical decision making (see below for details).  Minimal symptoms with hypertension, normal neurological exam, otherwise normal vital signs blood pressure 200/60, quite  an elevated pulse pressure.  Patient does have a mechanical heart valve and there is a systolic murmur auscultated.  She is not having any syncopal episodes, really no other complaints.  Suspect this could be rebound hypertension given that she was taken off of the amlodipine by nephrology.  CT head obtained showing no obvious abnormalities, no bleeding from hypertension.  Otherwise no indication for further testing or admission, appropriate for follow-up with her cardiologist and her PCP to further manage her blood pressure.       Barth Kirks. Sedonia Small, MD [place] [email or web address]  Final Clinical Impressions(s) / ED Diagnoses     ICD-10-CM   1. Hypertension, unspecified type  I10       ED Discharge Orders     None        Discharge Instructions Discussed with and Provided to Patient:    Discharge Instructions      You were evaluated in the Emergency Department and after careful evaluation, we did not  find any emergent condition requiring admission or further testing in the hospital.  Your exam/testing today was overall reassuring.  CT scan without any abnormalities or emergencies.  Recommend close follow-up with your primary care doctor and cardiologist regarding your blood pressure.  Please return to the Emergency Department if you experience any worsening of your condition.  Thank you for allowing [place] to be a part of your care.        Maudie Flakes, MD 06/22/21 [phone]

## 2021-06-23 ENCOUNTER — Encounter: Payer: Self-pay | Admitting: Oncology

## 2021-06-30 ENCOUNTER — Other Ambulatory Visit: Payer: Self-pay

## 2021-06-30 ENCOUNTER — Ambulatory Visit
Admission: RE | Admit: 2021-06-30 | Discharge: 2021-06-30 | Disposition: A | Discharge status: Home / Self Care | Payer: Medicare HMO | Source: Ambulatory Visit | Attending: Obstetrics & Gynecology | Admitting: Obstetrics & Gynecology

## 2021-06-30 DIAGNOSIS — Z1231 Encounter for screening mammogram for malignant neoplasm of breast: Secondary | ICD-10-CM | POA: Diagnosis not present

## 2021-07-08 ENCOUNTER — Encounter: Payer: Self-pay | Admitting: Cardiovascular Disease

## 2021-07-08 ENCOUNTER — Ambulatory Visit: Payer: Medicare HMO | Admitting: Cardiovascular Disease

## 2021-07-08 ENCOUNTER — Ambulatory Visit (INDEPENDENT_AMBULATORY_CARE_PROVIDER_SITE_OTHER): Payer: Medicare HMO

## 2021-07-08 ENCOUNTER — Other Ambulatory Visit: Payer: Self-pay

## 2021-07-08 VITALS — BP 154/40 | HR 53 | Ht 60.0 in | Wt 131.6 lb

## 2021-07-08 DIAGNOSIS — Z952 Presence of prosthetic heart valve: Secondary | ICD-10-CM | POA: Diagnosis not present

## 2021-07-08 DIAGNOSIS — Z7901 Long term (current) use of anticoagulants: Secondary | ICD-10-CM | POA: Diagnosis not present

## 2021-07-08 DIAGNOSIS — I272 Pulmonary hypertension, unspecified: Secondary | ICD-10-CM | POA: Diagnosis not present

## 2021-07-08 DIAGNOSIS — I779 Disorder of arteries and arterioles, unspecified: Secondary | ICD-10-CM

## 2021-07-08 DIAGNOSIS — I480 Paroxysmal atrial fibrillation: Secondary | ICD-10-CM

## 2021-07-08 DIAGNOSIS — I10 Essential (primary) hypertension: Secondary | ICD-10-CM

## 2021-07-08 DIAGNOSIS — I739 Peripheral vascular disease, unspecified: Secondary | ICD-10-CM

## 2021-07-08 DIAGNOSIS — I5033 Acute on chronic diastolic (congestive) heart failure: Secondary | ICD-10-CM

## 2021-07-08 DIAGNOSIS — I6523 Occlusion and stenosis of bilateral carotid arteries: Secondary | ICD-10-CM | POA: Diagnosis not present

## 2021-07-08 DIAGNOSIS — E7800 Pure hypercholesterolemia, unspecified: Secondary | ICD-10-CM | POA: Diagnosis not present

## 2021-07-08 LAB — POCT INR: INR: 4.2 — AB (ref 2.0–3.0)

## 2021-07-08 NOTE — Assessment & Plan Note (Signed)
History of [place] AVR 07/13/2004 which we have followed by 2D echocardiogram on annual basis most recently 02/03/2021.  Echo revealed normal LV systolic function, grade 3 diastolic dysfunction with a well-functioning aortic mechanical prosthesis.  We will continue to follow this on annual basis.

## 2021-07-08 NOTE — Assessment & Plan Note (Signed)
History of PAF maintaining sinus rhythm.  She is on Coumadin anticoagulation.

## 2021-07-08 NOTE — Assessment & Plan Note (Signed)
She had chronic diastolic heart failure on spironolactone and furosemide.

## 2021-07-08 NOTE — Patient Instructions (Signed)
Hold today and then decrease to 1.5 tablets (7.5mg ) daily, except 1 tablet Wednesday. Recheck INR in 1 week. Call [phone] if you need to change the appointment or if you have a procedure. Please call clinic if you start taking antibiotics - [phone]

## 2021-07-08 NOTE — Assessment & Plan Note (Signed)
History of PAD status post left iliac stenting by myself in 2002 with redilatation in 2006.  Her most recent lower extremity arterial Doppler study performed 05/19/2021 revealed mild to moderate in-stent restenosis within the left common iliac artery stent with mild right common iliac artery stenosis.  She does complain of some right hip pain thought to be orthopedic in nature followed by Dr. Ninfa Linden

## 2021-07-08 NOTE — Assessment & Plan Note (Signed)
History of essential hypertension blood pressure measured today at 154/40.  She is on low-dose metoprolol, amlodipine, clonidine, and hydralazine.

## 2021-07-08 NOTE — Patient Instructions (Signed)
Medication Instructions:  °Your physician recommends that you continue on your current medications as directed. Please refer to the Current Medication list given to you today. ° °*If you need a refill on your cardiac medications before your next appointment, please call your pharmacy* ° ° °Follow-Up: °At [facility], you and your health needs are our priority.  As part of our continuing mission to provide you with exceptional heart care, we have created designated Provider Care Teams.  These Care Teams include your primary Cardiologist (physician) and Advanced Practice Providers (APPs -  Physician Assistants and Nurse Practitioners) who all work together to provide you with the care you need, when you need it. ° °We recommend signing up for the patient portal called "MyChart".  Sign up information is provided on this After Visit Summary.  MyChart is used to connect with patients for Virtual Visits (Telemedicine).  Patients are able to view lab/test results, encounter notes, upcoming appointments, etc.  Non-urgent messages can be sent to your provider as well.   °To learn more about what you can do with MyChart, go to [email or web address].   ° °Your next appointment:   °12 month(s) ° °The format for your next appointment:   °In Person ° °Provider:   °Jonathan [name], MD  °

## 2021-07-08 NOTE — Assessment & Plan Note (Signed)
History of moderate to severe pulmonary hypertension followed by Dr. Aundra Dubin at my request.  Right ventricular systolic pressure was 30.

## 2021-07-08 NOTE — Assessment & Plan Note (Signed)
History of hyperlipidemia on statin therapy with lipid profile performed 07/02/2020 revealing total cholesterol 119, LDL 62 and HDL 24.

## 2021-07-08 NOTE — Assessment & Plan Note (Signed)
History of bilateral ICA stenosis left worse than right.  Her left ICA has somewhat progressed over the last year.  She does have moderate renal insufficiency making CTA not an option.  I am referring her to Dr. Fortunato Curling for surgical consultation.

## 2021-07-08 NOTE — Progress Notes (Signed)
07/08/2021 [name]   [birth year]  [id number]  Primary Physician [name], [name], MD Primary Cardiologist: Lorretta Harp MD [name], [place]  HPI:  [name] is a 80 y.o.  mildly overweight, widowed Caucasian female mother of 56, grandmother to 77 grandchildren whose daughter unfortunately recently died of ALS after a long protracted course.  She is accompanied by one of her daughters [name] today.  I last saw her in the office 05/28/2020.Marland KitchenMarland KitchenThe patient has a history of CAD and valvular heart disease as well as PVOD. She is status post coronary artery bypass grafting x2 with a [employer] Jude AVR July 13, 2004. Her other problems include hypertension, hyperlipidemia, diabetes, and discontinued tobacco abuse. I dilated her left common iliac and redilated her for in-stent restenosis in 2006. We have been following her lower extremity and carotid Dopplers since that time. She does have mild to moderate right and mild left ICA stenosis which has remained stable. She is neurologically asymptomatic. She denies chest pain, shortness of breath or claudication. [name] follows her lipid profile which was done most recently in March revealing a total cholesterol of 106, LDL of 53 and HDL of 29. Her most recent carotid Doppler in June did show moderate right and moderately severe left ICA stenosis which had not changed, and her lower extremity Dopplers revealed ABIs of 1 bilaterally with bilateral iliac disease. Since I saw her last a year ago she's remained stable and is asymptomatic otherwise. She does give right total hip replacement which is scheduled to be performed in Dr. Jean Rosenthal next week. I'm going to get a 2-D echocardiogram to assess her aortic prosthesis as well as a pharmacologic Myoview stress test to stratify her symptoms been over a year since her last functional study and she is a diabetic of 10 years post bypass grafting.she did have a high-risk Myoview back in 2014  that she had a false positive Myoview back in 2006 after her bypass surgery as well. She denies chest pain or shortness of breath. She ultimately never had her hip replacement.   She  has had laparoscopic cholecystectomy with subsequent diastolic heart failure requiring diuresis.  She is also had GI bleeds requiring multiple blood transfusions.  Her echo performed 02/24/2018 revealed normal LV systolic function, well-functioning aortic mechanical prosthesis with severe pulmonary hypertension and a PA pressure of 88 mmHg.   I did refer her to Dr. Aundra Dubin for evaluation treatment of pulmonary hypertension.  He performed right heart cath 06/27/2018 revealing pulmonary pressures in the 60s with a PVR of 3.1 Wood units and only mildly elevated pulmonary capillary wedge pressure.  He is continuing to follow her for her pulmonary hypertension.   She  has been diagnosed with eosinophilic granulomatosis with polyangiitis and is being treated with prednisone and rituximab.  She does feel clinically improved.  She does not get out of the house much.  She denies chest pain or shortness of breath.  Her last 2D echo performed 05/06/2019 revealed normal EF with pulmonary hypertension and an PA SP of 88 mmHg.  Carotid Dopplers performed 2 years ago did show moderate right and moderate to severe left ICA stenosis and lower extremity Dopplers 04/29/2018 showed a patent left iliac stent.  She denies claudication.     Since I saw her a year ago she continues to do well.  She does complain of some right hip pain which is followed by Dr. Ninfa Linden.  Carotid Dopplers suggest progression  of disease on the left.  Lower extremity Dopplers revealed patent left iliac stent with mild to moderate right iliac disease.  She denies chest pain or shortness of breath.   Current Meds  Medication Sig   acetaminophen (TYLENOL) 325 MG tablet Take 650 mg by mouth every 6 (six) hours as needed for mild pain.    allopurinol (ZYLOPRIM) 100 MG tablet  Take 100 mg by mouth daily.   ALPRAZolam (XANAX) 0.5 MG tablet Take 0.25 mg by mouth at bedtime.   amLODipine (NORVASC) 10 MG tablet TAKE 1 TABLET EVERY DAY   atorvastatin (LIPITOR) 80 MG tablet Take 1 tablet (80 mg total) by mouth daily at 6 PM.   Cholecalciferol (VITAMIN D3) 1000 UNITS CAPS Take 2,000 Units by mouth daily with lunch.    cloNIDine (CATAPRES) 0.2 MG tablet TAKE 1 TABLET TWICE DAILY   Cyanocobalamin (VITAMIN B12) 1000 MCG TBCR Take 1,000 mcg by mouth daily with lunch.   furosemide (LASIX) 40 MG tablet Take 2 tablets (80 mg total) by mouth in the morning AND 1.5 tablets (60 mg total) every evening.   hydrALAZINE (APRESOLINE) 100 MG tablet TAKE 1 TABLET THREE TIMES DAILY   lactose free nutrition (BOOST) LIQD Take 237 mLs by mouth daily. (To supplement meals)    magnesium oxide (MAG-OX) 400 (241.3 Mg) MG tablet Take 1 tablet (400 mg total) by mouth daily.   metoprolol succinate (TOPROL XL) 25 MG 24 hr tablet Take 0.5 tablets (12.5 mg total) by mouth daily.   pantoprazole (PROTONIX) 40 MG tablet Take 40 mg by mouth 2 (two) times daily.    spironolactone (ALDACTONE) 25 MG tablet TAKE 1 TABLET EVERY DAY   tadalafil, PAH, (ADCIRCA) 20 MG tablet Take 2 tablets (40 mg total) by mouth daily.   vitamin C (ASCORBIC ACID) 500 MG tablet Take 500 mg by mouth daily with lunch.   warfarin (COUMADIN) 5 MG tablet TAKE 1 AND 1/2 TABLETS TO 2 TABLETS DAILY AS DIRECTED BY COUMADIN CLINIC     Allergies  Allergen Reactions   Flagyl [Metronidazole] Rash    ALL-OVER BODY RASH   Ciprofloxacin     Other reaction(s): Stomach upset - tolerates   Coreg [Carvedilol] Other (See Comments)    Terrible cramping in the feet and had a lot of bowel movements, but not diarrhea   Losartan Swelling    Patient doesn't recall site of swelling   Sulfamethoxazole-Trimethoprim     Other reaction(s): stomach upset .   Verapamil Hives   Zetia [Ezetimibe] Other (See Comments)    Reaction not recalled   Zocor  [Simvastatin - High Dose] Other (See Comments)    Reaction not recalled   Gatifloxacin Rash    Redness to skin around eye    Social History   Socioeconomic History   Marital status: Widowed    Spouse name: Not on file   Number of children: Not on file   Years of education: Not on file   Highest education level: Not on file  Occupational History   Occupation: Retired  Tobacco Use   Smoking status: Former    Packs/day: 1.00    Years: 30.00    Pack years: 30.00    Types: Cigarettes   Smokeless tobacco: Never   Tobacco comments:    quit smoking 2005  Vaping Use   Vaping Use: Never used  Substance and Sexual Activity   Alcohol use: No    Alcohol/week: 0.0 standard drinks   Drug use: No  Sexual activity: Not Currently    Birth control/protection: None  Other Topics Concern   Not on file  Social History Narrative   Not on file   Social Determinants of Health   Financial Resource Strain: Not on file  Food Insecurity: Not on file  Transportation Needs: No Transportation Needs   Lack of Transportation (Medical): No   Lack of Transportation (Non-Medical): No  Physical Activity: Not on file  Stress: Not on file  Social Connections: Not on file  Intimate Partner Violence: Not on file     Review of Systems: General: negative for chills, fever, night sweats or weight changes.  Cardiovascular: negative for chest pain, dyspnea on exertion, edema, orthopnea, palpitations, paroxysmal nocturnal dyspnea or shortness of breath Dermatological: negative for rash Respiratory: negative for cough or wheezing Urologic: negative for hematuria Abdominal: negative for nausea, vomiting, diarrhea, bright red blood per rectum, melena, or hematemesis Neurologic: negative for visual changes, syncope, or dizziness All other systems reviewed and are otherwise negative except as noted above.    Blood pressure (!) 154/40, pulse (!) 53, height 5' (1.524 m), weight 131 lb 9.6 oz (59.7 kg), SpO2  98 %.  General appearance: alert and no distress Neck: no adenopathy, no JVD, supple, symmetrical, trachea midline, thyroid not enlarged, symmetric, no tenderness/mass/nodules, and bilateral carotid bruits Lungs: clear to auscultation bilaterally Heart: Crisp prosthetic valve sounds with a 2/6 outflow tract murmur consistent with aortic stenosis. Extremities: extremities normal, atraumatic, no cyanosis or edema Pulses: 2+ and symmetric Skin: Skin color, texture, turgor normal. No rashes or lesions Neurologic: Grossly normal  EKG sinus bradycardia 53 with first-degree AV block.  I personally reviewed this EKG.  ASSESSMENT AND PLAN:   Hypertension History of essential hypertension blood pressure measured today at 154/40.  She is on low-dose metoprolol, amlodipine, clonidine, and hydralazine.  Hypercholesteremia History of hyperlipidemia on statin therapy with lipid profile performed 07/02/2020 revealing total cholesterol 119, LDL 62 and HDL 24.  Mechanical heart valve present History of [place] AVR 07/13/2004 which we have followed by 2D echocardiogram on annual basis most recently 02/03/2021.  Echo revealed normal LV systolic function, grade 3 diastolic dysfunction with a well-functioning aortic mechanical prosthesis.  We will continue to follow this on annual basis.  Acute on chronic diastolic heart failure ([place]) She had chronic diastolic heart failure on spironolactone and furosemide.  Moderate to severe pulmonary hypertension (HCC) History of moderate to severe pulmonary hypertension followed by Dr. Aundra Dubin at my request.  Right ventricular systolic pressure was 30.  PAF (paroxysmal atrial fibrillation) (HCC) History of PAF maintaining sinus rhythm.  She is on Coumadin anticoagulation.  Carotid artery occlusion History of bilateral ICA stenosis left worse than right.  Her left ICA has somewhat progressed over the last year.  She does have moderate renal insufficiency making CTA not  an option.  I am referring her to Dr. Fortunato Curling for surgical consultation.  Peripheral arterial disease ([place]) History of PAD status post left iliac stenting by myself in 2002 with redilatation in 2006.  Her most recent lower extremity arterial Doppler study performed 05/19/2021 revealed mild to moderate in-stent restenosis within the left common iliac artery stent with mild right common iliac artery stenosis.  She does complain of some right hip pain thought to be orthopedic in nature followed by Dr. Lewayne Bunting MD [facility] 07/08/2021 10:26 AM

## 2021-07-11 NOTE — Addendum Note (Signed)
Addended by: Venetia Maxon on: 07/11/2021 10:42 AM   Modules accepted: Orders

## 2021-07-12 ENCOUNTER — Other Ambulatory Visit: Payer: Self-pay

## 2021-07-12 ENCOUNTER — Ambulatory Visit: Payer: Medicare HMO | Admitting: Orthopaedic Surgery

## 2021-07-12 ENCOUNTER — Encounter: Payer: Self-pay | Admitting: Orthopaedic Surgery

## 2021-07-12 DIAGNOSIS — M1611 Unilateral primary osteoarthritis, right hip: Secondary | ICD-10-CM

## 2021-07-12 NOTE — Progress Notes (Signed)
HPI: [name] returns today for follow-up of her right hip osteoarthritis.  She underwent an intra-articular injection by Dr. Ernestina Patches on 06/16/2021.  States it gave her about 50% improvement.  She still sore.  She has a fear of falling she does use a cane.  She notes that the hip pain in the back she cannot trust the hip affects her activities of daily living.  Her family does not want her going to the grocery store by herself due to the fact she may fall.  Given she has significant coronary artery disease and also carotid artery disease which is going to be worked up see if she has been referred to the vascular surgeons.  She is kidney disease.  She is diabetic.  She is also on chronic Coumadin.  Review of systems: Positive for right hip pain.  No chest pain.   Physical exam: General well-developed well-nourished female no acute distress.  Ambulates with cane. Psych: Alert and oriented x3. Respiratory: Breathing is unlabored.  Impression: End-stage right hip osteoarthritis  Plan: Discussed with her due to her multiple comorbidities would not recommend surgical intervention at this time.  She could have a repeat injection in the hip in 4 to 6 months.  She will call us and let us know.  Questions were encouraged and answered by Dr. Ninfa Linden and myself.  She will continue to use a cane in her left hand to ambulate.

## 2021-07-13 ENCOUNTER — Other Ambulatory Visit: Payer: Self-pay

## 2021-07-13 DIAGNOSIS — E11319 Type 2 diabetes mellitus with unspecified diabetic retinopathy without macular edema: Secondary | ICD-10-CM | POA: Diagnosis not present

## 2021-07-13 DIAGNOSIS — M810 Age-related osteoporosis without current pathological fracture: Secondary | ICD-10-CM | POA: Diagnosis not present

## 2021-07-13 DIAGNOSIS — E7800 Pure hypercholesterolemia, unspecified: Secondary | ICD-10-CM | POA: Diagnosis not present

## 2021-07-13 DIAGNOSIS — M109 Gout, unspecified: Secondary | ICD-10-CM | POA: Diagnosis not present

## 2021-07-13 NOTE — Patient Outreach (Signed)
[facility]) Care Management  [place]  07/13/2021   [name] 17-Jul-1941 [id number]  Subjective: Telephone call to patient for disease management follow up. Patient doing ok.  Heart failure management continues.  Also discussed blood sugar management as patient is not on medications at this time.  No concerns.    Objective:   Encounter Medications:  Outpatient Encounter Medications as of 07/13/2021  Medication Sig Note   acetaminophen (TYLENOL) 325 MG tablet Take 650 mg by mouth every 6 (six) hours as needed for mild pain.     allopurinol (ZYLOPRIM) 100 MG tablet Take 100 mg by mouth daily.    ALPRAZolam (XANAX) 0.5 MG tablet Take 0.25 mg by mouth at bedtime.    amLODipine (NORVASC) 10 MG tablet TAKE 1 TABLET EVERY DAY    atorvastatin (LIPITOR) 80 MG tablet Take 1 tablet (80 mg total) by mouth daily at 6 PM.    Blood Glucose Monitoring Suppl (TRUE METRIX METER) w/Device KIT     Cholecalciferol (VITAMIN D3) 1000 UNITS CAPS Take 2,000 Units by mouth daily with lunch.     ciprofloxacin (CIPRO) 500 MG tablet Take 500 mg by mouth daily as needed.    cloNIDine (CATAPRES) 0.2 MG tablet TAKE 1 TABLET TWICE DAILY    Cyanocobalamin (VITAMIN B12) 1000 MCG TBCR Take 1,000 mcg by mouth daily with lunch.    furosemide (LASIX) 40 MG tablet Take 2 tablets (80 mg total) by mouth in the morning AND 1.5 tablets (60 mg total) every evening. 07/08/2021: Patient takes 80 mg in am   glucose blood (TRUE METRIX BLOOD GLUCOSE TEST) test strip Use 1-2 times daily    hydrALAZINE (APRESOLINE) 100 MG tablet TAKE 1 TABLET THREE TIMES DAILY    lactose free nutrition (BOOST) LIQD Take 237 mLs by mouth daily. (To supplement meals)     magnesium oxide (MAG-OX) 400 (241.3 Mg) MG tablet Take 1 tablet (400 mg total) by mouth daily.    metoprolol succinate (TOPROL XL) 25 MG 24 hr tablet Take 0.5 tablets (12.5 mg total) by mouth daily.    pantoprazole (PROTONIX) 40 MG tablet Take 40 mg by  mouth 2 (two) times daily.     spironolactone (ALDACTONE) 25 MG tablet TAKE 1 TABLET EVERY DAY    tadalafil, PAH, (ADCIRCA) 20 MG tablet Take 2 tablets (40 mg total) by mouth daily.    TRUE METRIX BLOOD GLUCOSE TEST test strip SMARTSIG:Via Meter    vitamin C (ASCORBIC ACID) 500 MG tablet Take 500 mg by mouth daily with lunch.    warfarin (COUMADIN) 5 MG tablet TAKE 1 AND 1/2 TABLETS TO 2 TABLETS DAILY AS DIRECTED BY COUMADIN CLINIC    No facility-administered encounter medications on file as of 07/13/2021.    Functional Status:  In your present state of health, do you have any difficulty performing the following activities: 07/13/2021 01/13/2021  Hearing? N N  Vision? N N  Difficulty concentrating or making decisions? N N  Walking or climbing stairs? Y Y  Comment weakness weakness  Dressing or bathing? N N  Doing errands, shopping? Tempie Donning  Comment son and neighbor assists as needed son and neighbor assists as needed  Conservation officer, nature and eating ? N N  Using the Toilet? N N  In the past six months, have you accidently leaked urine? Y Y  Comment ocassional ocassional  Do you have problems with loss of bowel control? N N  Managing your Medications? N N  Managing your  Finances? N N  Housekeeping or managing your Housekeeping? Y Y  Comment family assists as needed family assists as needed  Some recent data might be hidden    Fall/Depression Screening: Fall Risk  07/13/2021 01/13/2021 03/24/2020  Falls in the past year? 0 0 0  Comment - - -  Number falls in past yr: - - -  Injury with Fall? - - -  Risk for fall due to : - - -  Risk for fall due to: Comment - - -  Follow up - - -   PHQ 2/9 Scores 07/13/2021 04/19/2021 01/13/2021 01/26/2020 11/25/2019 07/31/2019 05/12/2019  PHQ - 2 Score 0 0 0 1 1 2 3  PHQ- 9 Score - - - - - 9 9    Assessment:   Care Plan Care Plan : Heart Failure (Adult)  Updates made by Leath, Dionne, RN since 07/13/2021 12:00 AM     Problem: Symptom Exacerbation (Heart  Failure)      Long-Range Goal: Symptom Exacerbation Prevented or Minimized as evidenced by patient having no acute heart failure   Start Date: 01/13/2021  Expected End Date: 04/07/2022  This Visit's Progress: On track  Recent Progress: On track  Priority: High  Note:   Evidence-based guidance:  Perform or review cognitive and/or health literacy screening.  Assess understanding of adherence and barriers to treatment plan, as well as lifestyle changes; develop strategies to address barriers.  Establish a mutually-agreed-upon early intervention process to communicate with primary care provider when signs/symptoms worsen.   Notes:  01/13/21 Patient continues to follow heart failure regimen and follow up with physicians as scheduled.      Task: Identify and Minimize Risk of Heart Failure Exacerbation   Due Date: 04/07/2022  Priority: Routine  Responsible User: Leath, Dionne, RN  Note:   Care Management Activities:    - healthy lifestyle promoted - rescue (action) plan reviewed    Notes: 04/13/21 Patient reports doing ok. Weight up to 129 lbs.   Heart Failure Management Discussed Please weight daily or as ordered by your doctor Report to your doctor weight gain of 2 -3 pounds in a day or 5 pounds in a week Limit salt intake Monitor for shortness of breath, swelling of feet, ankles or abdomen and weight gain. Never use the saltshaker.  Read all food labels and avoid canned, processed, and pickled foods.   Follow your doctor's recommendations for daily salt intake 07/13/21 Patient weight 126 lbs.  Daily weights continue.  Discussed heart failure management and importance of weights and diet.        Goals Addressed             This Visit's Progress    Track and Manage Fluids and Swelling-Heart Failure   On track    Barriers: Health Behaviors Knowledge Timeframe:  Long-Range Goal Priority:  High Start Date:         01/13/21                    Expected End Date:   04/07/22  Follow  Up Date 11/08/2021  - call office if I gain more than 2 pounds in one day or 5 pounds in one week - keep legs up while sitting - track weight in diary - use salt in moderation    Why is this important?   It is important to check your weight daily and watch how much salt and liquids you have.  It will help you to   manage your heart failure.    Notes:  01/13/21 Patient continues to weigh daily and use salt in moderation. Patient knows action plan.   04/13/21 Patient weight 129 lbs.  Discussed heart failure management and notifying physician for any changes.  Heart Failure Management Discussed Please weight daily or as ordered by your doctor Report to your doctor weight gain of 2 -3 pounds in a day or 5 pounds in a week Limit salt intake Monitor for shortness of breath, swelling of feet, ankles or abdomen and weight gain. Never use the saltshaker.  Read all food labels and avoid canned, processed, and pickled foods.   Follow your doctor's recommendations for daily salt intake 07/13/21 Daily weights continue.  Weight 126 lbs.  Discussed heart failure and importance of weights and diet.          Plan:  Follow-up: Patient agrees to Care Plan and Follow-up. Follow-up in 3 month(s)  Dionne J Leath, RN, MSN [facility] Care Management Care Management Coordinator Direct Line [phone] Cell [phone] Toll Free: [phone]  Fax: [phone]     

## 2021-07-13 NOTE — Patient Instructions (Signed)
Goals Addressed             This Visit's Progress    Track and Manage Fluids and Swelling-Heart Failure   On track    Barriers: Health Behaviors Knowledge Timeframe:  Long-Range Goal Priority:  High Start Date:         01/13/21                    Expected End Date:   04/07/22  Follow Up Date 11/08/2021  - call office if I gain more than 2 pounds in one day or 5 pounds in one week - keep legs up while sitting - track weight in diary - use salt in moderation    Why is this important?   It is important to check your weight daily and watch how much salt and liquids you have.  It will help you to manage your heart failure.    Notes:  01/13/21 Patient continues to weigh daily and use salt in moderation. Patient knows action plan.   04/13/21 Patient weight 129 lbs.  Discussed heart failure management and notifying physician for any changes.  Heart Failure Management Discussed Please weight daily or as ordered by your doctor Report to your doctor weight gain of 2 -3 pounds in a day or 5 pounds in a week Limit salt intake Monitor for shortness of breath, swelling of feet, ankles or abdomen and weight gain. Never use the saltshaker.  Read all food labels and avoid canned, processed, and pickled foods.   Follow your doctor's recommendations for daily salt intake 07/13/21 Daily weights continue.  Weight 126 lbs.  Discussed heart failure and importance of weights and diet.

## 2021-07-14 ENCOUNTER — Other Ambulatory Visit: Payer: Self-pay

## 2021-07-14 ENCOUNTER — Ambulatory Visit (INDEPENDENT_AMBULATORY_CARE_PROVIDER_SITE_OTHER): Payer: Medicare HMO | Admitting: *Deleted

## 2021-07-14 DIAGNOSIS — Z952 Presence of prosthetic heart valve: Secondary | ICD-10-CM

## 2021-07-14 DIAGNOSIS — Z7901 Long term (current) use of anticoagulants: Secondary | ICD-10-CM

## 2021-07-14 LAB — POCT INR: INR: 2.6 (ref 2.0–3.0)

## 2021-07-14 NOTE — Patient Instructions (Signed)
Description   Continue taking Warfarin 1.5 tablets (7.5mg ) daily, except 1 tablet Wednesday. Please keep green leafy veggies and boost in your diet. Recheck INR in 2 weeks. Call [phone] if you need to change the appointment or if you have a procedure. Please call clinic if you start taking antibiotics [phone]

## 2021-07-15 DIAGNOSIS — M810 Age-related osteoporosis without current pathological fracture: Secondary | ICD-10-CM | POA: Diagnosis not present

## 2021-07-16 ENCOUNTER — Other Ambulatory Visit (HOSPITAL_COMMUNITY): Payer: Self-pay | Admitting: Cardiology

## 2021-07-19 ENCOUNTER — Inpatient Hospital Stay: Payer: Medicare HMO | Attending: Oncology

## 2021-07-19 ENCOUNTER — Inpatient Hospital Stay: Payer: Medicare HMO

## 2021-07-19 ENCOUNTER — Other Ambulatory Visit: Payer: Self-pay

## 2021-07-19 VITALS — BP 168/47 | HR 65 | Temp 97.7°F | Resp 16

## 2021-07-19 DIAGNOSIS — N189 Chronic kidney disease, unspecified: Secondary | ICD-10-CM | POA: Diagnosis not present

## 2021-07-19 DIAGNOSIS — D696 Thrombocytopenia, unspecified: Secondary | ICD-10-CM

## 2021-07-19 DIAGNOSIS — I739 Peripheral vascular disease, unspecified: Secondary | ICD-10-CM

## 2021-07-19 DIAGNOSIS — D508 Other iron deficiency anemias: Secondary | ICD-10-CM

## 2021-07-19 DIAGNOSIS — D631 Anemia in chronic kidney disease: Secondary | ICD-10-CM | POA: Insufficient documentation

## 2021-07-19 DIAGNOSIS — D500 Iron deficiency anemia secondary to blood loss (chronic): Secondary | ICD-10-CM

## 2021-07-19 DIAGNOSIS — I776 Arteritis, unspecified: Secondary | ICD-10-CM | POA: Insufficient documentation

## 2021-07-19 DIAGNOSIS — D594 Other nonautoimmune hemolytic anemias: Secondary | ICD-10-CM

## 2021-07-19 LAB — CBC WITH DIFFERENTIAL/PLATELET
Abs Immature Granulocytes: 0.08 10*3/uL — ABNORMAL HIGH (ref 0.00–0.07)
Basophils Absolute: 0.1 10*3/uL (ref 0.0–0.1)
Basophils Relative: 1 %
Eosinophils Absolute: 0.2 10*3/uL (ref 0.0–0.5)
Eosinophils Relative: 3 %
HCT: 31.5 % — ABNORMAL LOW (ref 36.0–46.0)
Hemoglobin: 10.2 g/dL — ABNORMAL LOW (ref 12.0–15.0)
Immature Granulocytes: 1 %
Lymphocytes Relative: 17 %
Lymphs Abs: 1.2 10*3/uL (ref 0.7–4.0)
MCH: 30.4 pg (ref 26.0–34.0)
MCHC: 32.4 g/dL (ref 30.0–36.0)
MCV: 93.8 fL (ref 80.0–100.0)
Monocytes Absolute: 0.5 10*3/uL (ref 0.1–1.0)
Monocytes Relative: 7 %
Neutro Abs: 5 10*3/uL (ref 1.7–7.7)
Neutrophils Relative %: 71 %
Platelets: 162 10*3/uL (ref 150–400)
RBC: 3.36 MIL/uL — ABNORMAL LOW (ref 3.87–5.11)
RDW: 15.2 % (ref 11.5–15.5)
WBC: 7.1 10*3/uL (ref 4.0–10.5)
nRBC: 0 % (ref 0.0–0.2)

## 2021-07-19 LAB — RETICULOCYTES
Immature Retic Fract: 5.8 % (ref 2.3–15.9)
RBC.: 3.33 MIL/uL — ABNORMAL LOW (ref 3.87–5.11)
Retic Count, Absolute: 43.6 10*3/uL (ref 19.0–186.0)
Retic Ct Pct: 1.3 % (ref 0.4–3.1)

## 2021-07-19 LAB — SAVE SMEAR(SSMR), FOR PROVIDER SLIDE REVIEW

## 2021-07-19 LAB — FERRITIN: Ferritin: 324 ng/mL — ABNORMAL HIGH (ref 11–307)

## 2021-07-19 MED ORDER — EPOETIN ALFA-EPBX 20000 UNIT/ML IJ SOLN
20000.0000 [IU] | Freq: Once | INTRAMUSCULAR | Status: AC
  Administered 2021-07-19: 20000 [IU] via SUBCUTANEOUS
  Filled 2021-07-19: qty 1

## 2021-07-19 MED ORDER — EPOETIN ALFA-EPBX 40000 UNIT/ML IJ SOLN
40000.0000 [IU] | Freq: Once | INTRAMUSCULAR | Status: AC
  Administered 2021-07-19: 40000 [IU] via SUBCUTANEOUS
  Filled 2021-07-19: qty 1

## 2021-07-20 DIAGNOSIS — E46 Unspecified protein-calorie malnutrition: Secondary | ICD-10-CM | POA: Diagnosis not present

## 2021-07-20 DIAGNOSIS — I700 Atherosclerosis of aorta: Secondary | ICD-10-CM | POA: Diagnosis not present

## 2021-07-20 DIAGNOSIS — I130 Hypertensive heart and chronic kidney disease with heart failure and stage 1 through stage 4 chronic kidney disease, or unspecified chronic kidney disease: Secondary | ICD-10-CM | POA: Diagnosis not present

## 2021-07-20 DIAGNOSIS — I482 Chronic atrial fibrillation, unspecified: Secondary | ICD-10-CM | POA: Diagnosis not present

## 2021-07-20 DIAGNOSIS — D6869 Other thrombophilia: Secondary | ICD-10-CM | POA: Diagnosis not present

## 2021-07-20 DIAGNOSIS — Z0000 Encounter for general adult medical examination without abnormal findings: Secondary | ICD-10-CM | POA: Diagnosis not present

## 2021-07-20 DIAGNOSIS — E1151 Type 2 diabetes mellitus with diabetic peripheral angiopathy without gangrene: Secondary | ICD-10-CM | POA: Diagnosis not present

## 2021-07-20 DIAGNOSIS — Z1389 Encounter for screening for other disorder: Secondary | ICD-10-CM | POA: Diagnosis not present

## 2021-07-20 DIAGNOSIS — Z1331 Encounter for screening for depression: Secondary | ICD-10-CM | POA: Diagnosis not present

## 2021-07-20 DIAGNOSIS — D61818 Other pancytopenia: Secondary | ICD-10-CM | POA: Diagnosis not present

## 2021-07-20 DIAGNOSIS — E7800 Pure hypercholesterolemia, unspecified: Secondary | ICD-10-CM | POA: Diagnosis not present

## 2021-07-20 DIAGNOSIS — R82998 Other abnormal findings in urine: Secondary | ICD-10-CM | POA: Diagnosis not present

## 2021-07-20 DIAGNOSIS — I2510 Atherosclerotic heart disease of native coronary artery without angina pectoris: Secondary | ICD-10-CM | POA: Diagnosis not present

## 2021-07-20 DIAGNOSIS — Z23 Encounter for immunization: Secondary | ICD-10-CM | POA: Diagnosis not present

## 2021-07-20 DIAGNOSIS — N184 Chronic kidney disease, stage 4 (severe): Secondary | ICD-10-CM | POA: Diagnosis not present

## 2021-07-28 ENCOUNTER — Other Ambulatory Visit: Payer: Self-pay

## 2021-07-28 ENCOUNTER — Ambulatory Visit (INDEPENDENT_AMBULATORY_CARE_PROVIDER_SITE_OTHER): Payer: Medicare HMO | Admitting: *Deleted

## 2021-07-28 DIAGNOSIS — Z7901 Long term (current) use of anticoagulants: Secondary | ICD-10-CM

## 2021-07-28 DIAGNOSIS — Z952 Presence of prosthetic heart valve: Secondary | ICD-10-CM

## 2021-07-28 DIAGNOSIS — Z5181 Encounter for therapeutic drug level monitoring: Secondary | ICD-10-CM | POA: Diagnosis not present

## 2021-07-28 LAB — POCT INR: INR: 3.9 — AB (ref 2.0–3.0)

## 2021-07-28 NOTE — Patient Instructions (Signed)
Description   Do not take any Warfarin today then continue taking Warfarin 1.5 tablets (7.5mg ) daily except 1 tablet Wednesday. Please keep green leafy veggies and boost in your diet. Recheck INR in 2 weeks. Call [phone] if you need to change the appointment or if you have a procedure. Please call clinic if you start taking antibiotics [phone]

## 2021-08-10 ENCOUNTER — Other Ambulatory Visit (HOSPITAL_COMMUNITY): Payer: Self-pay | Admitting: Adult Health

## 2021-08-11 ENCOUNTER — Other Ambulatory Visit: Payer: Self-pay

## 2021-08-11 ENCOUNTER — Ambulatory Visit (INDEPENDENT_AMBULATORY_CARE_PROVIDER_SITE_OTHER): Payer: Medicare HMO | Admitting: *Deleted

## 2021-08-11 DIAGNOSIS — Z7901 Long term (current) use of anticoagulants: Secondary | ICD-10-CM

## 2021-08-11 DIAGNOSIS — Z5181 Encounter for therapeutic drug level monitoring: Secondary | ICD-10-CM

## 2021-08-11 DIAGNOSIS — Z952 Presence of prosthetic heart valve: Secondary | ICD-10-CM

## 2021-08-11 LAB — POCT INR: INR: 2.5 (ref 2.0–3.0)

## 2021-08-11 NOTE — Patient Instructions (Signed)
Description   Continue taking Warfarin 1.5 tablets (7.5mg ) daily except 1 tablet Wednesday. Please keep green leafy veggies and boost in your diet. Recheck INR in 3 weeks. [phone]

## 2021-08-12 ENCOUNTER — Other Ambulatory Visit: Payer: Self-pay

## 2021-08-12 DIAGNOSIS — I6529 Occlusion and stenosis of unspecified carotid artery: Secondary | ICD-10-CM

## 2021-08-16 ENCOUNTER — Other Ambulatory Visit: Payer: Self-pay

## 2021-08-16 ENCOUNTER — Inpatient Hospital Stay: Payer: Medicare HMO

## 2021-08-16 ENCOUNTER — Inpatient Hospital Stay: Payer: Medicare HMO | Attending: Oncology

## 2021-08-16 VITALS — BP 149/58 | HR 51 | Temp 97.7°F | Resp 18

## 2021-08-16 DIAGNOSIS — D508 Other iron deficiency anemias: Secondary | ICD-10-CM

## 2021-08-16 DIAGNOSIS — I776 Arteritis, unspecified: Secondary | ICD-10-CM | POA: Diagnosis not present

## 2021-08-16 DIAGNOSIS — D631 Anemia in chronic kidney disease: Secondary | ICD-10-CM | POA: Insufficient documentation

## 2021-08-16 DIAGNOSIS — D500 Iron deficiency anemia secondary to blood loss (chronic): Secondary | ICD-10-CM

## 2021-08-16 DIAGNOSIS — I739 Peripheral vascular disease, unspecified: Secondary | ICD-10-CM

## 2021-08-16 DIAGNOSIS — D594 Other nonautoimmune hemolytic anemias: Secondary | ICD-10-CM

## 2021-08-16 DIAGNOSIS — N189 Chronic kidney disease, unspecified: Secondary | ICD-10-CM | POA: Diagnosis not present

## 2021-08-16 DIAGNOSIS — D696 Thrombocytopenia, unspecified: Secondary | ICD-10-CM

## 2021-08-16 LAB — CBC WITH DIFFERENTIAL/PLATELET
Abs Immature Granulocytes: 0.02 10*3/uL (ref 0.00–0.07)
Basophils Absolute: 0.1 10*3/uL (ref 0.0–0.1)
Basophils Relative: 1 %
Eosinophils Absolute: 0.2 10*3/uL (ref 0.0–0.5)
Eosinophils Relative: 4 %
HCT: 28.7 % — ABNORMAL LOW (ref 36.0–46.0)
Hemoglobin: 9.3 g/dL — ABNORMAL LOW (ref 12.0–15.0)
Immature Granulocytes: 0 %
Lymphocytes Relative: 21 %
Lymphs Abs: 1.3 10*3/uL (ref 0.7–4.0)
MCH: 30.5 pg (ref 26.0–34.0)
MCHC: 32.4 g/dL (ref 30.0–36.0)
MCV: 94.1 fL (ref 80.0–100.0)
Monocytes Absolute: 0.4 10*3/uL (ref 0.1–1.0)
Monocytes Relative: 7 %
Neutro Abs: 4.3 10*3/uL (ref 1.7–7.7)
Neutrophils Relative %: 67 %
Platelets: 149 10*3/uL — ABNORMAL LOW (ref 150–400)
RBC: 3.05 MIL/uL — ABNORMAL LOW (ref 3.87–5.11)
RDW: 15.6 % — ABNORMAL HIGH (ref 11.5–15.5)
WBC: 6.4 10*3/uL (ref 4.0–10.5)
nRBC: 0 % (ref 0.0–0.2)

## 2021-08-16 LAB — FERRITIN: Ferritin: 359 ng/mL — ABNORMAL HIGH (ref 11–307)

## 2021-08-16 LAB — RETICULOCYTES
Immature Retic Fract: 4.1 % (ref 2.3–15.9)
RBC.: 2.99 MIL/uL — ABNORMAL LOW (ref 3.87–5.11)
Retic Count, Absolute: 40.7 10*3/uL (ref 19.0–186.0)
Retic Ct Pct: 1.4 % (ref 0.4–3.1)

## 2021-08-16 LAB — SAVE SMEAR(SSMR), FOR PROVIDER SLIDE REVIEW

## 2021-08-16 MED ORDER — EPOETIN ALFA-EPBX 20000 UNIT/ML IJ SOLN
20000.0000 [IU] | Freq: Once | INTRAMUSCULAR | Status: AC
  Administered 2021-08-16: 20000 [IU] via SUBCUTANEOUS
  Filled 2021-08-16: qty 1

## 2021-08-16 MED ORDER — EPOETIN ALFA-EPBX 40000 UNIT/ML IJ SOLN
40000.0000 [IU] | Freq: Once | INTRAMUSCULAR | Status: AC
  Administered 2021-08-16: 40000 [IU] via SUBCUTANEOUS
  Filled 2021-08-16: qty 1

## 2021-08-17 ENCOUNTER — Encounter: Payer: Self-pay | Admitting: Podiatry

## 2021-08-17 ENCOUNTER — Ambulatory Visit: Payer: Medicare HMO | Admitting: Podiatry

## 2021-08-17 ENCOUNTER — Other Ambulatory Visit: Payer: Self-pay | Admitting: *Deleted

## 2021-08-17 DIAGNOSIS — M79675 Pain in left toe(s): Secondary | ICD-10-CM | POA: Diagnosis not present

## 2021-08-17 DIAGNOSIS — E1151 Type 2 diabetes mellitus with diabetic peripheral angiopathy without gangrene: Secondary | ICD-10-CM

## 2021-08-17 DIAGNOSIS — M79674 Pain in right toe(s): Secondary | ICD-10-CM

## 2021-08-17 DIAGNOSIS — B351 Tinea unguium: Secondary | ICD-10-CM

## 2021-08-21 NOTE — Progress Notes (Signed)
Subjective: [name] is a 80 y.o. female patient seen today for at risk foot care today. She has h/o diabetes and PAD. She is seen for follow up of  painful thick toenails that are difficult to trim. Pain interferes with ambulation. Aggravating factors include wearing enclosed shoe gear. Pain is relieved with periodic professional debridement.  New problems reported today: None.  Patient did not check blood glucose on today.  PCP is [name], [name], MD. Last visit was: three weeks ago.  Allergies  Allergen Reactions   Flagyl [Metronidazole] Rash    ALL-OVER BODY RASH   Ciprofloxacin     Other reaction(s): Stomach upset - tolerates   Coreg [Carvedilol] Other (See Comments)    Terrible cramping in the feet and had a lot of bowel movements, but not diarrhea   Losartan Swelling    Patient doesn't recall site of swelling   Sulfamethoxazole-Trimethoprim     Other reaction(s): stomach upset .   Verapamil Hives   Zetia [Ezetimibe] Other (See Comments)    Reaction not recalled   Zocor [Simvastatin - High Dose] Other (See Comments)    Reaction not recalled   Gatifloxacin Rash    Redness to skin around eye    Objective: Physical Exam  General: Patient is a pleasant 80 y.o. Caucasian female WD, WN in NAD. AAO x 3.   Neurovascular Examination: CFT <3 seconds b/l LE. Faintly palpable pedal pulses b/l LE. Pedal hair absent b/l LE. Skin temperature gradient WNL b/l. No pain with calf compression b/l. No edema b/l LE. Patient wearing compression hose on today's visit.  Protective sensation intact 5/5 intact bilaterally with 10g monofilament b/l. Vibratory sensation intact b/l.  Dermatological:  Pedal skin thin, shiny and atrophic b/l LE. No open wounds b/l LE. No interdigital macerations noted b/l LE. Toenails 1-5 b/l elongated, discolored, dystrophic, thickened, crumbly with subungual debris and tenderness to dorsal palpation. No hyperkeratotic nor porokeratotic lesions present on  today's visit.  Musculoskeletal:  Normal muscle strength 5/5 to all lower extremity muscle groups bilaterally. No pain, crepitus or joint limitation noted with ROM b/l LE. No gross bony pedal deformities b/l. Patient ambulates independently without assistive aids. Tailor's bunion deformity noted b/l LE.  Assessment: 1. Pain due to onychomycosis of toenails of both feet   2. Type II diabetes mellitus with peripheral circulatory disorder [facility])    Plan: Patient was evaluated and treated and all questions answered. Consent given for treatment as described below: -No new findings. No new orders. -Continue diabetic foot care principles: inspect feet daily, monitor glucose as recommended by PCP and/or Endocrinologist, and follow prescribed diet per PCP, Endocrinologist and/or dietician. -Patient to continue soft, supportive shoe gear daily. Start procedure for diabetic shoes. Patient qualifies based on diagnoses. -Mycotic toenails 1-5 bilaterally were debrided in length and girth with sterile nail nippers and dremel without incident. -Patient/POA to call should there be question/concern in the interim.  Return in about 3 months (around 11/17/2021).  Marzetta Board, DPM

## 2021-08-23 ENCOUNTER — Ambulatory Visit (HOSPITAL_COMMUNITY)
Admission: RE | Admit: 2021-08-23 | Discharge: 2021-08-23 | Disposition: A | Discharge status: Home / Self Care | Payer: Medicare HMO | Source: Ambulatory Visit | Attending: Vascular Surgery | Admitting: Vascular Surgery

## 2021-08-23 ENCOUNTER — Other Ambulatory Visit: Payer: Self-pay

## 2021-08-23 ENCOUNTER — Encounter: Payer: Self-pay | Admitting: Vascular Surgery

## 2021-08-23 ENCOUNTER — Ambulatory Visit: Payer: Medicare HMO | Admitting: Vascular Surgery

## 2021-08-23 VITALS — BP 161/59 | HR 60 | Temp 98.1°F | Ht 60.0 in | Wt 130.1 lb

## 2021-08-23 DIAGNOSIS — I6529 Occlusion and stenosis of unspecified carotid artery: Secondary | ICD-10-CM | POA: Insufficient documentation

## 2021-08-23 DIAGNOSIS — I6523 Occlusion and stenosis of bilateral carotid arteries: Secondary | ICD-10-CM | POA: Diagnosis not present

## 2021-08-23 NOTE — Progress Notes (Signed)
Patient name: [name] MRN: [id number] DOB: Jul 23, 1941 Sex: female  REASON FOR CONSULT: Evaluate high-grade left internal carotid stenosis  HPI: [name] is a 80 y.o. female, with history of hypertension, hyperlipidemia, congestive heart failure, pulmonary hypertension, A. Fib on coumadin, chronic kidney disease, CAD s/p CABG with AVR, peripheral arterial disease that presents for evaluation of high-grade left internal carotid stenosis.  She has been followed by Dr. [name] who has performed lower extremity interventions for her PAD.  She has been noted to have progression of her left ICA stenosis now 80-99%.  She denies any history of strokes or TIAs.  She does live independently.  She walks with a cane.  No history of radiation or neck surgery in the past.  Past Medical History:  Diagnosis Date   Aortic atherosclerosis ([place]) 01/23/2018   Atrial fibrillation ([facility])    Carotid artery disease ([place])    Cholelithiasis 01/23/2018   Chronic anticoagulation    Coronary artery disease    status post coronary artery bypass grafting times 07/10/2004   Diabetes mellitus    Hypercholesteremia    Hypertension    Mechanical heart valve present    H. aortic valve replacement at the time of bypass surgery October 2005   Moderate to severe pulmonary hypertension ([place])    Peripheral arterial disease ([place])    history of left common iliac artery PTA and stenting for a chronic total occlusion 08/26/01    Past Surgical History:  Procedure Laterality Date   anterior repair  2009   AORTIC VALVE REPLACEMENT  2005   Dr. Cyndia Bent   CARDIAC CATHETERIZATION  11/10/2004   40% right common illiac, 70% in stent restenosis of distal left common illiac,    CARDIAC CATHETERIZATION  05/18/2004   LAD 50-70% midstenosis, RCA dominant w/50% stenosis, 50% Right common Illiac artery ostial stenosis, 90% in stent restenosis within midportion of left common illiac stent   Carotid Duplex  03/12/2012   RSA-elev.  velocities suggestive of a 50-69% diameter reduction, Right&Left Bulb/Prox ICA-mild-mod.fibrous plaqueelevating Velocities abnormal study.   CHOLECYSTECTOMY N/A 03/01/2018   Procedure: LAPAROSCOPIC CHOLECYSTECTOMY;  Surgeon: Judeth Horn, MD;  Location: [facility];  Service: General;  Laterality: N/A;   COLONOSCOPY WITH PROPOFOL N/A 01/22/2018   Procedure: COLONOSCOPY WITH PROPOFOL;  Surgeon: Wilford Corner, MD;  Location: [facility];  Service: Endoscopy;  Laterality: N/A;   ESOPHAGOGASTRODUODENOSCOPY (EGD) WITH PROPOFOL N/A 01/22/2018   Procedure: ESOPHAGOGASTRODUODENOSCOPY (EGD) WITH PROPOFOL;  Surgeon: Wilford Corner, MD;  Location: [facility];  Service: Endoscopy;  Laterality: N/A;   ESOPHAGOGASTRODUODENOSCOPY (EGD) WITH PROPOFOL N/A 11/21/2018   Procedure: ESOPHAGOGASTRODUODENOSCOPY (EGD) WITH PROPOFOL;  Surgeon: [name], MD;  Location: [facility];  Service: Gastroenterology;  Laterality: N/A;   ESOPHAGOGASTRODUODENOSCOPY (EGD) WITH PROPOFOL Left 02/27/2019   Procedure: ESOPHAGOGASTRODUODENOSCOPY (EGD) WITH PROPOFOL;  Surgeon: Arta Silence, MD;  Location: [facility] ENDOSCOPY;  Service: Endoscopy;  Laterality: Left;   GIVENS CAPSULE STUDY N/A 11/21/2018   Procedure: GIVENS CAPSULE STUDY;  Surgeon: [name], MD;  Location: [place] [facility];  Service: Gastroenterology;  Laterality: N/A;  To be deployed during EGD   Lower Ext. Duplex  03/12/2012   Right Proximal CIA- vessel narrowing w/elevated velocities 0-49% diameter reduction. Right SFA-mild mixed density plaque throughout vessel.   NM MYOCAR PERF WALL MOTION  05/19/2010   protocol: Persantine, post stress EF 65%, negative for ischemia, low risk scan   RIGHT HEART CATH N/A 06/27/2018   Procedure: RIGHT HEART CATH;  Surgeon: [name], MD;  Location: [facility];  Service: Cardiovascular;  Laterality: N/A;   TOTAL ABDOMINAL HYSTERECTOMY W/ BILATERAL SALPINGOOPHORECTOMY  1989   TRANSTHORACIC ECHOCARDIOGRAM  08/29/2012    Moderately calcified annulus of mitral valve, moderate regurg. of both mitral valve and tricuspid valve.     Family History  Problem Relation Age of Onset   Breast cancer Neg Hx     SOCIAL HISTORY: Social History   Socioeconomic History   Marital status: Widowed    Spouse name: Not on file   Number of children: Not on file   Years of education: Not on file   Highest education level: Not on file  Occupational History   Occupation: Retired  Tobacco Use   Smoking status: Former    Packs/day: 1.00    Years: 30.00    Pack years: 30.00    Types: Cigarettes   Smokeless tobacco: Never   Tobacco comments:    quit smoking 2005  Vaping Use   Vaping Use: Never used  Substance and Sexual Activity   Alcohol use: No    Alcohol/week: 0.0 standard drinks   Drug use: No   Sexual activity: Not Currently    Birth control/protection: None  Other Topics Concern   Not on file  Social History Narrative   Not on file   Social Determinants of Health   Financial Resource Strain: Not on file  Food Insecurity: Not on file  Transportation Needs: No Transportation Needs   Lack of Transportation (Medical): No   Lack of Transportation (Non-Medical): No  Physical Activity: Not on file  Stress: Not on file  Social Connections: Not on file  Intimate Partner Violence: Not on file    Allergies  Allergen Reactions   Flagyl [Metronidazole] Rash    ALL-OVER BODY RASH   Ciprofloxacin     Other reaction(s): Stomach upset - tolerates   Coreg [Carvedilol] Other (See Comments)    Terrible cramping in the feet and had a lot of bowel movements, but not diarrhea   Losartan Swelling    Patient doesn't recall site of swelling   Sulfamethoxazole-Trimethoprim     Other reaction(s): stomach upset .   Verapamil Hives   Zetia [Ezetimibe] Other (See Comments)    Reaction not recalled   Zocor [Simvastatin - High Dose] Other (See Comments)    Reaction not recalled   Gatifloxacin Rash    Redness to skin  around eye    Current Outpatient Medications  Medication Sig Dispense Refill   acetaminophen (TYLENOL) 325 MG tablet Take 650 mg by mouth every 6 (six) hours as needed for mild pain.      allopurinol (ZYLOPRIM) 100 MG tablet Take 100 mg by mouth daily.     ALPRAZolam (XANAX) 0.5 MG tablet Take 0.25 mg by mouth at bedtime.     ALPRAZolam (XANAX) 0.5 MG tablet Take 1 tablet by mouth at bedtime as needed.     amLODipine (NORVASC) 10 MG tablet TAKE 1 TABLET EVERY DAY 90 tablet 3   atorvastatin (LIPITOR) 80 MG tablet Take 1 tablet (80 mg total) by mouth daily at 6 PM.     Blood Glucose Monitoring Suppl (TRUE METRIX METER) w/Device KIT      Cholecalciferol (VITAMIN D3) 1000 UNITS CAPS Take 2,000 Units by mouth daily with lunch.      ciprofloxacin (CIPRO) 500 MG tablet Take 500 mg by mouth daily as needed.     cloNIDine (CATAPRES) 0.2 MG tablet TAKE 1 TABLET TWICE DAILY 180 tablet 3     Cyanocobalamin (VITAMIN B12) 1000 MCG TBCR Take 1,000 mcg by mouth daily with lunch.     furosemide (LASIX) 40 MG tablet TAKE 2 TABLETS (80 MG TOTAL) IN MORNING AND 1 AND 1/2 TABLETS (60 MG TOTAL) IN EVENING. CHANGE IN DOSAGE. 315 tablet 0   glucose blood (TRUE METRIX BLOOD GLUCOSE TEST) test strip Use 1-2 times daily     hydrALAZINE (APRESOLINE) 100 MG tablet TAKE 1 TABLET THREE TIMES DAILY 270 tablet 3   lactose free nutrition (BOOST) LIQD Take 237 mLs by mouth daily. (To supplement meals)      magnesium oxide (MAG-OX) 400 (241.3 Mg) MG tablet Take 1 tablet (400 mg total) by mouth daily.     metoprolol succinate (TOPROL-XL) 25 MG 24 hr tablet TAKE 1/2 TABLET EVERY DAY 45 tablet 3   nitrofurantoin, macrocrystal-monohydrate, (MACROBID) 100 MG capsule Take 1 capsule po 2x a day x 1 week     pantoprazole (PROTONIX) 40 MG tablet Take 40 mg by mouth 2 (two) times daily.      spironolactone (ALDACTONE) 25 MG tablet TAKE 1 TABLET EVERY DAY 90 tablet 3   tadalafil, PAH, (ADCIRCA) 20 MG tablet Take 2 tablets (40 mg total) by  mouth daily. 60 tablet 11   TRUE METRIX BLOOD GLUCOSE TEST test strip SMARTSIG:Via Meter     vitamin C (ASCORBIC ACID) 500 MG tablet Take 500 mg by mouth daily with lunch.     warfarin (COUMADIN) 5 MG tablet TAKE 1 AND 1/2 TABLETS TO 2 TABLETS DAILY AS DIRECTED BY COUMADIN CLINIC 180 tablet 0   No current facility-administered medications for this visit.    REVIEW OF SYSTEMS:  [X] denotes positive finding, [ ] denotes negative finding Cardiac  Comments:  Chest pain or chest pressure:    Shortness of breath upon exertion:    Short of breath when lying flat:    Irregular heart rhythm:        Vascular    Pain in calf, thigh, or hip brought on by ambulation:    Pain in feet at night that wakes you up from your sleep:     Blood clot in your veins:    Leg swelling:         Pulmonary    Oxygen at home:    Productive cough:     Wheezing:         Neurologic    Sudden weakness in arms or legs:     Sudden numbness in arms or legs:     Sudden onset of difficulty speaking or slurred speech:    Temporary loss of vision in one eye:     Problems with dizziness:         Gastrointestinal    Blood in stool:     Vomited blood:         Genitourinary    Burning when urinating:     Blood in urine:        Psychiatric    Major depression:         Hematologic    Bleeding problems:    Problems with blood clotting too easily:        Skin    Rashes or ulcers:        Constitutional    Fever or chills:      PHYSICAL EXAM: Vitals:   08/23/21 0950 08/23/21 0952  BP: (!) 160/64 (!) 161/59  Pulse: 60   Temp: 98.1 F (36.7 C)   TempSrc: Temporal     SpO2: 97%   Weight: 130 lb 1.6 oz (59 kg)   Height: 5' (1.524 m)     GENERAL: The patient is a well-nourished female, in no acute distress. The vital signs are documented above. CARDIAC: Irregular rhythm.  PULMONARY: No respiratory distress ABDOMEN: Soft and non-tender. MUSCULOSKELETAL: There are no major deformities or  cyanosis. NEUROLOGIC: No focal weakness or paresthesias are detected.  Cranial nerves II through XII grossly intact SKIN: There are no ulcers or rashes noted. PSYCHIATRIC: The patient has a normal affect.  DATA:   Carotid duplex 05/19/2021 shows a 60 to 79% right ICA stenosis and a high-grade 80-99% left ICA stenosis.  The high grade left ICA stenosis was again confirmed on duplex today in our office today.  Assessment/Plan:  80-year-old female with multiple medical comorbidities presents with asymptomatic high-grade left ICA stenosis 80 to 99% based on carotid duplex criteria.  I discussed current indications are for surgical intervention for greater than 80% stenosis in the setting of asymptomatic disease for stroke risk reduction.  I discussed options of carotid endarterectomy versus TCAR with carotid stenting.  Given her comorbidities, I think a TCAR would be a great option especially to get her back on anticoagulation quicker without interruption.  Ultimately I recommended a CT neck and we will have to do this without contrast given her chronic kidney disease to evaluate for degree of calcification.  Certainly if she has severe calcification she may not be a candidate for TCAR.  I will have her follow with me after CT neck is complete in the next 2-3 weeks.  We will make the best decision about surgery versus medical management after CT neck is complete.   Christopher J. Clark, MD [facility] Office: [phone]     

## 2021-08-25 ENCOUNTER — Other Ambulatory Visit: Payer: Self-pay

## 2021-08-25 DIAGNOSIS — I6523 Occlusion and stenosis of bilateral carotid arteries: Secondary | ICD-10-CM

## 2021-08-31 ENCOUNTER — Ambulatory Visit (INDEPENDENT_AMBULATORY_CARE_PROVIDER_SITE_OTHER): Payer: Medicare HMO | Admitting: *Deleted

## 2021-08-31 ENCOUNTER — Other Ambulatory Visit: Payer: Self-pay

## 2021-08-31 DIAGNOSIS — Z7901 Long term (current) use of anticoagulants: Secondary | ICD-10-CM

## 2021-08-31 DIAGNOSIS — Z952 Presence of prosthetic heart valve: Secondary | ICD-10-CM | POA: Diagnosis not present

## 2021-08-31 DIAGNOSIS — Z5181 Encounter for therapeutic drug level monitoring: Secondary | ICD-10-CM

## 2021-08-31 LAB — POCT INR: INR: 3.4 — AB (ref 2.0–3.0)

## 2021-08-31 NOTE — Patient Instructions (Signed)
Description   Do not take any Warfarin today then continue taking Warfarin 1.5 tablets (7.5mg ) daily except 1 tablet Wednesday. Please keep green leafy veggies and boost in your diet. Recheck INR in 3 weeks. [phone]

## 2021-09-02 ENCOUNTER — Other Ambulatory Visit: Payer: Self-pay

## 2021-09-02 ENCOUNTER — Ambulatory Visit (HOSPITAL_COMMUNITY)
Admission: RE | Admit: 2021-09-02 | Discharge: 2021-09-02 | Disposition: A | Discharge status: Home / Self Care | Payer: Medicare HMO | Source: Ambulatory Visit | Attending: Vascular Surgery | Admitting: Vascular Surgery

## 2021-09-02 DIAGNOSIS — M47812 Spondylosis without myelopathy or radiculopathy, cervical region: Secondary | ICD-10-CM | POA: Diagnosis not present

## 2021-09-02 DIAGNOSIS — I6523 Occlusion and stenosis of bilateral carotid arteries: Secondary | ICD-10-CM | POA: Insufficient documentation

## 2021-09-02 DIAGNOSIS — E042 Nontoxic multinodular goiter: Secondary | ICD-10-CM | POA: Diagnosis not present

## 2021-09-06 ENCOUNTER — Other Ambulatory Visit: Payer: Self-pay

## 2021-09-06 ENCOUNTER — Encounter: Payer: Self-pay | Admitting: Vascular Surgery

## 2021-09-06 ENCOUNTER — Ambulatory Visit (INDEPENDENT_AMBULATORY_CARE_PROVIDER_SITE_OTHER): Payer: Medicare HMO | Admitting: Vascular Surgery

## 2021-09-06 VITALS — BP 166/70 | HR 60 | Temp 97.8°F | Resp 16 | Ht 60.0 in | Wt 126.6 lb

## 2021-09-06 DIAGNOSIS — I6523 Occlusion and stenosis of bilateral carotid arteries: Secondary | ICD-10-CM | POA: Diagnosis not present

## 2021-09-06 NOTE — Progress Notes (Signed)
Patient name: [name] MRN: [id number] DOB: 04/02/1941 Sex: female  REASON FOR CONSULT: Follow-up after CT neck for high-grade asymptomatic left carotid stenosis  HPI: [name] is a 80 y.o. female, with history of hypertension, hyperlipidemia, diastolic heart failure, moderate to severe pulmonary hypertension, A. Fib on coumadin, chronic kidney disease, CAD s/p CABG with mechanical AVR, peripheral arterial disease that presents after CT neck for further evaluation of high-grade left internal carotid stenosis.  She has been followed by Dr. [name] who has performed lower extremity interventions for her PAD.  She has been noted to have progression of her left ICA stenosis now 80-99% by duplex.  No history of stroke or TIA.  Given her CKD we sent her for CT of the neck w/o contrast to evaluate for possible carotid stenting with TCAR.  She follows up today after the CT.  Past Medical History:  Diagnosis Date   Aortic atherosclerosis ([place]) 01/23/2018   Atrial fibrillation ([facility])    Carotid artery disease ([place])    Cholelithiasis 01/23/2018   Chronic anticoagulation    Coronary artery disease    status post coronary artery bypass grafting times 07/10/2004   Diabetes mellitus    Hypercholesteremia    Hypertension    Mechanical heart valve present    H. aortic valve replacement at the time of bypass surgery October 2005   Moderate to severe pulmonary hypertension ([place])    Peripheral arterial disease ([place])    history of left common iliac artery PTA and stenting for a chronic total occlusion 08/26/01    Past Surgical History:  Procedure Laterality Date   anterior repair  2009   AORTIC VALVE REPLACEMENT  2005   Dr. Cyndia Bent   CARDIAC CATHETERIZATION  11/10/2004   40% right common illiac, 70% in stent restenosis of distal left common illiac,    CARDIAC CATHETERIZATION  05/18/2004   LAD 50-70% midstenosis, RCA dominant w/50% stenosis, 50% Right common Illiac artery ostial stenosis, 90%  in stent restenosis within midportion of left common illiac stent   Carotid Duplex  03/12/2012   RSA-elev. velocities suggestive of a 50-69% diameter reduction, Right&Left Bulb/Prox ICA-mild-mod.fibrous plaqueelevating Velocities abnormal study.   CHOLECYSTECTOMY N/A 03/01/2018   Procedure: LAPAROSCOPIC CHOLECYSTECTOMY;  Surgeon: Judeth Horn, MD;  Location: [place];  Service: General;  Laterality: N/A;   COLONOSCOPY WITH PROPOFOL N/A 01/22/2018   Procedure: COLONOSCOPY WITH PROPOFOL;  Surgeon: Wilford Corner, MD;  Location: [facility];  Service: Endoscopy;  Laterality: N/A;   ESOPHAGOGASTRODUODENOSCOPY (EGD) WITH PROPOFOL N/A 01/22/2018   Procedure: ESOPHAGOGASTRODUODENOSCOPY (EGD) WITH PROPOFOL;  Surgeon: Wilford Corner, MD;  Location: [place];  Service: Endoscopy;  Laterality: N/A;   ESOPHAGOGASTRODUODENOSCOPY (EGD) WITH PROPOFOL N/A 11/21/2018   Procedure: ESOPHAGOGASTRODUODENOSCOPY (EGD) WITH PROPOFOL;  Surgeon: [name], MD;  Location: [facility];  Service: Gastroenterology;  Laterality: N/A;   ESOPHAGOGASTRODUODENOSCOPY (EGD) WITH PROPOFOL Left 02/27/2019   Procedure: ESOPHAGOGASTRODUODENOSCOPY (EGD) WITH PROPOFOL;  Surgeon: Arta Silence, MD;  Location: [facility] ENDOSCOPY;  Service: Endoscopy;  Laterality: Left;   GIVENS CAPSULE STUDY N/A 11/21/2018   Procedure: GIVENS CAPSULE STUDY;  Surgeon: [name], MD;  Location: [place];  Service: Gastroenterology;  Laterality: N/A;  To be deployed during EGD   Lower Ext. Duplex  03/12/2012   Right Proximal CIA- vessel narrowing w/elevated velocities 0-49% diameter reduction. Right SFA-mild mixed density plaque throughout vessel.   NM MYOCAR PERF WALL MOTION  05/19/2010   protocol: Persantine, post stress EF 65%, negative for ischemia, low risk  scan   RIGHT HEART CATH N/A 06/27/2018   Procedure: RIGHT HEART CATH;  Surgeon: [name], MD;  Location: [place] [facility];  Service: Cardiovascular;  Laterality: N/A;   TOTAL ABDOMINAL  HYSTERECTOMY W/ BILATERAL SALPINGOOPHORECTOMY  1989   TRANSTHORACIC ECHOCARDIOGRAM  08/29/2012   Moderately calcified annulus of mitral valve, moderate regurg. of both mitral valve and tricuspid valve.     Family History  Problem Relation Age of Onset   Breast cancer Neg Hx     SOCIAL HISTORY: Social History   Socioeconomic History   Marital status: Widowed    Spouse name: Not on file   Number of children: Not on file   Years of education: Not on file   Highest education level: Not on file  Occupational History   Occupation: Retired  Tobacco Use   Smoking status: Former    Packs/day: 1.00    Years: 30.00    Pack years: 30.00    Types: Cigarettes   Smokeless tobacco: Never   Tobacco comments:    quit smoking 2005  Vaping Use   Vaping Use: Never used  Substance and Sexual Activity   Alcohol use: No    Alcohol/week: 0.0 standard drinks   Drug use: No   Sexual activity: Not Currently    Birth control/protection: None  Other Topics Concern   Not on file  Social History Narrative   Not on file   Social Determinants of Health   Financial Resource Strain: Not on file  Food Insecurity: Not on file  Transportation Needs: No Transportation Needs   Lack of Transportation (Medical): No   Lack of Transportation (Non-Medical): No  Physical Activity: Not on file  Stress: Not on file  Social Connections: Not on file  Intimate Partner Violence: Not on file    Allergies  Allergen Reactions   Flagyl [Metronidazole] Rash    ALL-OVER BODY RASH   Ciprofloxacin     Other reaction(s): Stomach upset - tolerates   Coreg [Carvedilol] Other (See Comments)    Terrible cramping in the feet and had a lot of bowel movements, but not diarrhea   Losartan Swelling    Patient doesn't recall site of swelling   Sulfamethoxazole-Trimethoprim     Other reaction(s): stomach upset .   Verapamil Hives   Zetia [Ezetimibe] Other (See Comments)    Reaction not recalled   Zocor [Simvastatin -  High Dose] Other (See Comments)    Reaction not recalled   Gatifloxacin Rash    Redness to skin around eye    Current Outpatient Medications  Medication Sig Dispense Refill   acetaminophen (TYLENOL) 325 MG tablet Take 650 mg by mouth every 6 (six) hours as needed for mild pain.      allopurinol (ZYLOPRIM) 100 MG tablet Take 100 mg by mouth daily.     ALPRAZolam (XANAX) 0.5 MG tablet Take 0.25 mg by mouth at bedtime.     ALPRAZolam (XANAX) 0.5 MG tablet Take 1 tablet by mouth at bedtime as needed.     amLODipine (NORVASC) 10 MG tablet TAKE 1 TABLET EVERY DAY 90 tablet 3   atorvastatin (LIPITOR) 80 MG tablet Take 1 tablet (80 mg total) by mouth daily at 6 PM.     Blood Glucose Monitoring Suppl (TRUE METRIX METER) w/Device KIT      Cholecalciferol (VITAMIN D3) 1000 UNITS CAPS Take 2,000 Units by mouth daily with lunch.      ciprofloxacin (CIPRO) 500 MG tablet Take 500 mg by  mouth daily as needed.     cloNIDine (CATAPRES) 0.2 MG tablet TAKE 1 TABLET TWICE DAILY 180 tablet 3   Cyanocobalamin (VITAMIN B12) 1000 MCG TBCR Take 1,000 mcg by mouth daily with lunch.     furosemide (LASIX) 40 MG tablet TAKE 2 TABLETS (80 MG TOTAL) IN MORNING AND 1 AND 1/2 TABLETS (60 MG TOTAL) IN EVENING. CHANGE IN DOSAGE. 315 tablet 0   glucose blood (TRUE METRIX BLOOD GLUCOSE TEST) test strip Use 1-2 times daily     hydrALAZINE (APRESOLINE) 100 MG tablet TAKE 1 TABLET THREE TIMES DAILY 270 tablet 3   lactose free nutrition (BOOST) LIQD Take 237 mLs by mouth daily. (To supplement meals)      magnesium oxide (MAG-OX) 400 (241.3 Mg) MG tablet Take 1 tablet (400 mg total) by mouth daily.     metoprolol succinate (TOPROL-XL) 25 MG 24 hr tablet TAKE 1/2 TABLET EVERY DAY 45 tablet 3   nitrofurantoin, macrocrystal-monohydrate, (MACROBID) 100 MG capsule Take 1 capsule po 2x a day x 1 week     pantoprazole (PROTONIX) 40 MG tablet Take 40 mg by mouth 2 (two) times daily.      spironolactone (ALDACTONE) 25 MG tablet TAKE 1  TABLET EVERY DAY 90 tablet 3   tadalafil, PAH, (ADCIRCA) 20 MG tablet Take 2 tablets (40 mg total) by mouth daily. 60 tablet 11   TRUE METRIX BLOOD GLUCOSE TEST test strip SMARTSIG:Via Meter     vitamin C (ASCORBIC ACID) 500 MG tablet Take 500 mg by mouth daily with lunch.     warfarin (COUMADIN) 5 MG tablet TAKE 1 AND 1/2 TABLETS TO 2 TABLETS DAILY AS DIRECTED BY COUMADIN CLINIC 180 tablet 0   No current facility-administered medications for this visit.    REVIEW OF SYSTEMS:  _0  denotes positive finding, _1  denotes negative finding Cardiac  Comments:  Chest pain or chest pressure:    Shortness of breath upon exertion:    Short of breath when lying flat:    Irregular heart rhythm:        Vascular    Pain in calf, thigh, or hip brought on by ambulation:    Pain in feet at night that wakes you up from your sleep:     Blood clot in your veins:    Leg swelling:         Pulmonary    Oxygen at home:    Productive cough:     Wheezing:         Neurologic    Sudden weakness in arms or legs:     Sudden numbness in arms or legs:     Sudden onset of difficulty speaking or slurred speech:    Temporary loss of vision in one eye:     Problems with dizziness:         Gastrointestinal    Blood in stool:     Vomited blood:         Genitourinary    Burning when urinating:     Blood in urine:        Psychiatric    Major depression:         Hematologic    Bleeding problems:    Problems with blood clotting too easily:        Skin    Rashes or ulcers:        Constitutional    Fever or chills:      PHYSICAL EXAM: Vitals:   09/06/21 1242  09/06/21 1245  BP: (!) 162/59 (!) 166/70  Pulse: 60   Resp: 16   Temp: 97.8 F (36.6 C)   TempSrc: Temporal   SpO2: 100%   Weight: 126 lb 9.6 oz (57.4 kg)   Height: 5' (1.524 m)     GENERAL: The patient is a well-nourished female, in no acute distress. The vital signs are documented above. CARDIAC: Irregular rhythm.  PULMONARY: No  respiratory distress ABDOMEN: Soft and non-tender. MUSCULOSKELETAL: There are no major deformities or cyanosis. NEUROLOGIC: No focal weakness or paresthesias are detected.  Cranial nerves II through XII grossly intact SKIN: There are no ulcers or rashes noted. PSYCHIATRIC: The patient has a normal affect.  DATA:   Carotid duplex 05/19/2021 shows a 60 to 79% right ICA stenosis and a high-grade 80-99% left ICA stenosis.  The high grade left ICA stenosis was again confirmed on duplex in our office.  CT neck without contrast is limited to evaluate carotid stenosis without contrast but she has significant calcification throughout the left common carotid artery into the bifurcation with significant circumferential calcification in the proximal ICA     Assessment/Plan:  80 year old female with multiple medical comorbidities presents with asymptomatic high-grade left ICA stenosis 80 to 99% based on carotid duplex criteria.  I initially thought her best option was likely TCAR given her comorbidities and the need to get her back on anticoagulation immediately for her mechanical aortic valve.  Unfortunately after review of her CT neck she has significant calcification in the common carotid and at the bifurcation that I think would make TCAR a very high risk option.  This calcification at the carotid bifurcation is circumferential and thick and would potentially prevent the stent from opening completely.  I discussed alternative would be medical management versus carotid endarterectomy.  I do have concerns about her surgical risk given moderate to severe pulmonary hypertension with diastolic heart failure and mechanical aortic valve that would require bridging her off anticoagulation.  Discussed that in asymptomatic carotid trials stroke risk reduction was from 11% with medical management to 5% over 5 years with modest risk reduction.  Discussed that in the setting of asymptomatic disease with her comorbid risk  it may be very reasonable to manage medically at this time.  I think her perioperative complication risk could be quite high with her comorbidities.  I will see her again in 6 months.  Discussed that she start an 81 mg aspirin daily.   Marty Heck, MD [facility] Office: [phone]

## 2021-09-07 ENCOUNTER — Ambulatory Visit (INDEPENDENT_AMBULATORY_CARE_PROVIDER_SITE_OTHER): Payer: Medicare HMO | Admitting: Obstetrics and Gynecology

## 2021-09-07 ENCOUNTER — Telehealth: Payer: Self-pay | Admitting: Podiatry

## 2021-09-07 ENCOUNTER — Encounter: Payer: Self-pay | Admitting: Obstetrics and Gynecology

## 2021-09-07 VITALS — BP 155/67 | HR 53 | Ht 59.0 in | Wt 126.0 lb

## 2021-09-07 DIAGNOSIS — R159 Full incontinence of feces: Secondary | ICD-10-CM | POA: Diagnosis not present

## 2021-09-07 DIAGNOSIS — N952 Postmenopausal atrophic vaginitis: Secondary | ICD-10-CM | POA: Diagnosis not present

## 2021-09-07 DIAGNOSIS — R350 Frequency of micturition: Secondary | ICD-10-CM | POA: Diagnosis not present

## 2021-09-07 DIAGNOSIS — I482 Chronic atrial fibrillation, unspecified: Secondary | ICD-10-CM | POA: Diagnosis not present

## 2021-09-07 DIAGNOSIS — N993 Prolapse of vaginal vault after hysterectomy: Secondary | ICD-10-CM

## 2021-09-07 DIAGNOSIS — N184 Chronic kidney disease, stage 4 (severe): Secondary | ICD-10-CM | POA: Diagnosis not present

## 2021-09-07 DIAGNOSIS — N3941 Urge incontinence: Secondary | ICD-10-CM

## 2021-09-07 DIAGNOSIS — I130 Hypertensive heart and chronic kidney disease with heart failure and stage 1 through stage 4 chronic kidney disease, or unspecified chronic kidney disease: Secondary | ICD-10-CM | POA: Diagnosis not present

## 2021-09-07 DIAGNOSIS — N8110 Cystocele, unspecified: Secondary | ICD-10-CM | POA: Diagnosis not present

## 2021-09-07 DIAGNOSIS — I5032 Chronic diastolic (congestive) heart failure: Secondary | ICD-10-CM | POA: Diagnosis not present

## 2021-09-07 LAB — POCT URINALYSIS DIPSTICK
Appearance: ABNORMAL
Bilirubin, UA: NEGATIVE
Blood, UA: NEGATIVE
Glucose, UA: NEGATIVE
Ketones, UA: NEGATIVE
Nitrite, UA: NEGATIVE
Protein, UA: POSITIVE — AB
Spec Grav, UA: 1.015 (ref 1.010–1.025)
Urobilinogen, UA: 0.2 E.U./dL
pH, UA: 5.5 (ref 5.0–8.0)

## 2021-09-07 MED ORDER — ESTROGENS CONJUGATED 0.625 MG/GM VA CREA: 1.0000 | TOPICAL_CREAM | VAGINAL | 11 refills | Status: DC

## 2021-09-07 NOTE — Patient Instructions (Signed)
Accidental Bowel Leakage: Our goal is to achieve formed bowel movements daily or every-other-day without leakage.  You may need to try different combinations of the following options to find what works best for you.  Some management options include: Dietary changes (more leafy greens, vegetables and fruits; less processed foods) Fiber supplementation (Metamucil or something with psyllium as active ingredient) Over-the-counter imodium (tablets or liquid) to help solidify the stool and prevent leakage of stool.  

## 2021-09-07 NOTE — Telephone Encounter (Signed)
Left message for pt that we are canceling the appt on 1.13 for the diabetic shoe measurements as our provider will be out of this office. But I have rescheduled it for 1.4 when pt is seeing Dr Prudence Davidson she can see the person for the diabetic shoe measurements on the same day.

## 2021-09-07 NOTE — Progress Notes (Signed)
[facility] New Patient Evaluation and Consultation  Referring Provider: Megan Salon, MD PCP: [name], MD Date of Service: 09/07/2021  SUBJECTIVE Chief Complaint: New Patient (Initial Visit) [name] is a 80 y.o. female here for a consult on prolapse.)  History of Present Illness: [name] is a 80 y.o. White or Caucasian female seen in consultation at the request of Dr. Sabra Heck for evaluation of prolapse.    Review of records from Dr Sabra Heck significant for: Pt attempted incontinence ring with support pessary but was able to expel. Using vaginal estrogen. Vaginal lesion biopsied which showed hyperkeratotic mucosa with inflammation.   Urinary Symptoms: Leaks urine with with a full bladder Leaks only occasionally- will "pour out"- will feel an urge then comes. Does not happen every day.  Pad use: 1 liners/ mini-pads per day.   She is bothered by her UI symptoms.  Day time voids 7-8.  Nocturia: 1 times per night to void. Voiding dysfunction: she empties her bladder well.  does not use a catheter to empty bladder.  When urinating, she feels a weak stream Drinks: half decaf coffee, about 40 oz water, decaf tea   UTIs: 2 UTI's in the last year.   Denies history of blood in urine and kidney or bladder stones  Pelvic Organ Prolapse Symptoms:                  She Admits to a feeling of a bulge the vaginal area. It has been present for many years- about 7-8. She Admits to seeing a bulge.  This bulge is not bothersome. Dr Sabra Heck had tried a iRWS pessary.   Bowel Symptom: Bowel movements: every 1-3 days Stool consistency: soft  or loose Straining: no.  Splinting: no.  Incomplete evacuation: no.  She Admits to accidental bowel leakage / fecal incontinence  Occurs: rare, depends on what she eats  Consistency with leakage: liquid Bowel regimen: none Last colonoscopy: Date 2019, Results- negative  Sexual Function Sexually active: no.   Pelvic  Pain Denies pelvic pain   Past Medical History:  Past Medical History:  Diagnosis Date   Aortic atherosclerosis ([place]) 01/23/2018   Atrial fibrillation ([facility])    Carotid artery disease ([place])    Cholelithiasis 01/23/2018   Chronic anticoagulation    Coronary artery disease    status post coronary artery bypass grafting times 07/10/2004   Diabetes mellitus    Hypercholesteremia    Hypertension    Mechanical heart valve present    H. aortic valve replacement at the time of bypass surgery October 2005   Moderate to severe pulmonary hypertension ([place])    Peripheral arterial disease ([place])    history of left common iliac artery PTA and stenting for a chronic total occlusion 08/26/01     Past Surgical History:   Past Surgical History:  Procedure Laterality Date   anterior repair  2009   AORTIC VALVE REPLACEMENT  2005   Dr. Cyndia Bent   CARDIAC CATHETERIZATION  11/10/2004   40% right common illiac, 70% in stent restenosis of distal left common illiac,    CARDIAC CATHETERIZATION  05/18/2004   LAD 50-70% midstenosis, RCA dominant w/50% stenosis, 50% Right common Illiac artery ostial stenosis, 90% in stent restenosis within midportion of left common illiac stent   Carotid Duplex  03/12/2012   RSA-elev. velocities suggestive of a 50-69% diameter reduction, Right&Left Bulb/Prox ICA-mild-mod.fibrous plaqueelevating Velocities abnormal study.   CHOLECYSTECTOMY N/A 03/01/2018   Procedure: LAPAROSCOPIC CHOLECYSTECTOMY;  Surgeon: Judeth Horn, MD;  Location: [facility];  Service: General;  Laterality: N/A;   COLONOSCOPY WITH PROPOFOL N/A 01/22/2018   Procedure: COLONOSCOPY WITH PROPOFOL;  Surgeon: Wilford Corner, MD;  Location: [facility];  Service: Endoscopy;  Laterality: N/A;   ESOPHAGOGASTRODUODENOSCOPY (EGD) WITH PROPOFOL N/A 01/22/2018   Procedure: ESOPHAGOGASTRODUODENOSCOPY (EGD) WITH PROPOFOL;  Surgeon: Wilford Corner, MD;  Location: [facility];  Service: Endoscopy;  Laterality: N/A;    ESOPHAGOGASTRODUODENOSCOPY (EGD) WITH PROPOFOL N/A 11/21/2018   Procedure: ESOPHAGOGASTRODUODENOSCOPY (EGD) WITH PROPOFOL;  Surgeon: [name], MD;  Location: [facility];  Service: Gastroenterology;  Laterality: N/A;   ESOPHAGOGASTRODUODENOSCOPY (EGD) WITH PROPOFOL Left 02/27/2019   Procedure: ESOPHAGOGASTRODUODENOSCOPY (EGD) WITH PROPOFOL;  Surgeon: Arta Silence, MD;  Location: [facility];  Service: Endoscopy;  Laterality: Left;   GIVENS CAPSULE STUDY N/A 11/21/2018   Procedure: GIVENS CAPSULE STUDY;  Surgeon: [name], MD;  Location: [facility];  Service: Gastroenterology;  Laterality: N/A;  To be deployed during EGD   Lower Ext. Duplex  03/12/2012   Right Proximal CIA- vessel narrowing w/elevated velocities 0-49% diameter reduction. Right SFA-mild mixed density plaque throughout vessel.   NM MYOCAR PERF WALL MOTION  05/19/2010   protocol: Persantine, post stress EF 65%, negative for ischemia, low risk scan   RIGHT HEART CATH N/A 06/27/2018   Procedure: RIGHT HEART CATH;  Surgeon: [name], MD;  Location: [facility];  Service: Cardiovascular;  Laterality: N/A;   TOTAL ABDOMINAL HYSTERECTOMY W/ BILATERAL SALPINGOOPHORECTOMY  1989   TRANSTHORACIC ECHOCARDIOGRAM  08/29/2012   Moderately calcified annulus of mitral valve, moderate regurg. of both mitral valve and tricuspid valve.      Past OB/GYN History: OB History  Gravida Para Term Preterm AB Living  5         5  SAB IAB Ectopic Multiple Live Births          5    # Outcome Date GA Lbr Len/2nd Weight Sex Delivery Anes PTL Lv  5 Gravida      Vag-Spont     4 Gravida      Vag-Spont     3 Grav[place]      Vag-Spont     2 [place]      Vag-Spont     1 [place]      Vag-Spont      S/p hysterectomy   Medications: She has a current medication list which includes the following prescription(s): acetaminophen, allopurinol, alprazolam, alprazolam, amlodipine, atorvastatin, true metrix meter, vitamin d3, ciprofloxacin,  clonidine, [START ON 09/08/2021] conjugated estrogens, vitamin b12, furosemide, true metrix blood glucose test, hydralazine, lactose free nutrition, magnesium oxide, metoprolol succinate, pantoprazole, spironolactone, tadalafil (pah), true metrix blood glucose test, vitamin c, and warfarin.   Allergies: Patient is allergic to flagyl [metronidazole], coreg [carvedilol], losartan, sulfamethoxazole-trimethoprim, verapamil, zetia [ezetimibe], zocor [simvastatin - high dose], and gatifloxacin.   Social History:  Social History   Tobacco Use   Smoking status: Former    Packs/day: 1.00    Years: 30.00    Pack years: 30.00    Types: Cigarettes   Smokeless tobacco: Never   Tobacco comments:    quit smoking 2005  Vaping Use   Vaping Use: Never used  Substance Use Topics   Alcohol use: No    Alcohol/week: 0.0 standard drinks   Drug use: No    Relationship status: widowed She lives alone   She is not employed. Regular exercise: No History of abuse: No  Family History:   Family History  Problem Relation Age of Onset   Heart disease Father    Hypertension Father    Hyperlipidemia Father    Breast cancer Neg Hx      Review of Systems: Review of Systems  Constitutional:  Negative for fever, malaise/fatigue and weight loss.  Respiratory:  Negative for cough, shortness of breath and wheezing.   Cardiovascular:  Positive for leg swelling. Negative for chest pain and palpitations.  Gastrointestinal:  Negative for abdominal pain and blood in stool.  Genitourinary:  Negative for dysuria.  Musculoskeletal:  Positive for myalgias.  Skin:  Negative for rash.  Neurological:  Positive for headaches. Negative for dizziness.  Endo/Heme/Allergies:  Bruises/bleeds easily.  Psychiatric/Behavioral:  Negative for depression. The patient is nervous/anxious.     OBJECTIVE Physical Exam: Vitals:   09/07/21 0950  BP: (!) 155/67  Pulse: (!) 53  Weight: 126 lb (57.2 kg)  Height: 4\' 11"  (1.499 m)     Physical Exam Constitutional:      General: She is not in acute distress. Pulmonary:     Effort: Pulmonary effort is normal.  Abdominal:     General: There is no distension.     Palpations: Abdomen is soft.     Tenderness: There is no abdominal tenderness. There is no rebound.  Musculoskeletal:        General: No swelling. Normal range of motion.  Skin:    General: Skin is warm and dry.     Findings: No rash.  Neurological:     Mental Status: She is alert and oriented to person, place, and time.  Psychiatric:        Mood and Affect: Mood normal.        Behavior: Behavior normal.     GU / Detailed Urogynecologic Evaluation:  Pelvic Exam: Normal external female genitalia; Bartholin's and Skene's glands normal in appearance; urethral meatus normal in appearance, no urethral masses or discharge.   CST: negative   s/p hysterectomy: Speculum exam reveals normal vaginal mucosa with  atrophy and normal vaginal cuff.  Adnexa no mass, fullness, tenderness.     Pelvic floor strength 0/V, puborectalis I/V external anal sphincter I/V  Pelvic floor musculature: Right levator non-tender, Right obturator non-tender, Left levator non-tender, Left obturator non-tender  POP-Q:   POP-Q  3                                            Aa   3                                           Ba  3                                              C   6                                            Gh  4  Pb  6                                            tvl   -2                                            Ap  -2                                            Bp                                                 D     Rectal Exam:  Normal sphincter tone, small distal rectocele, enterocoele present, no rectal masses, noted dyssynergia when asking the patient to bear down.  Post-Void Residual (PVR) by Bladder Scan: In order to evaluate bladder emptying, we  discussed obtaining a postvoid residual and she agreed to this procedure.  Procedure: The ultrasound unit was placed on the patient's abdomen in the suprapubic region after the patient had voided. A PVR of 6 ml was obtained by bladder scan.  Laboratory Results: POC urine: trace leukocytes   ASSESSMENT AND PLAN Ms. Yetman is a 80 y.o. with:  1. Vaginal vault prolapse after hysterectomy   2. Prolapse of anterior vaginal wall   3. Urge incontinence   4. Incontinence of feces, unspecified fecal incontinence type   5. Urinary frequency   6. Vaginal atrophy    Stage III anterior, Stage I posterior, Stage III apical prolapse - For treatment of pelvic organ prolapse, we discussed options for management including expectant management, conservative management, and surgical management, such as Kegels, a pessary, pelvic floor physical therapy, and specific surgical procedures. - She has tried pessaries before, but we discussed that there are alternative pessaries available which may be more beneficial (gellhorn, cube, etc). She is not a good surgical candidate due to medical comorbidities. We discussed the option of colpocleisis but would prefer to exhaust conservative management first. - Overall she does not feel she is bothered by her prolapse and prefers expectant management. We reviewed that if she has difficulty emptying her bladder then she should return for treatment.   2. Urge incontinence - does not happen everyday so she does not feel treatment is needed  3. Bowel leakage - Treatment options include anti-diarrhea medication (loperamide/ Imodium OTC or prescription lomotil), fiber supplements, physical therapy, and possible sacral neuromodulation or surgery.  Advised to start psyllium fiber supplement, and also take imodium if she eats something that she knows will upset her stomach.   4. Vaginal atrophy - currently on premarin cream but needs refill. Advised to use twice a week, as this has  helped prevent her urinary tract infections. She can use coconut oil for moisture on other days.   Return as needed  Jaquita Folds, MD

## 2021-09-09 ENCOUNTER — Other Ambulatory Visit: Payer: Self-pay

## 2021-09-09 DIAGNOSIS — I6523 Occlusion and stenosis of bilateral carotid arteries: Secondary | ICD-10-CM

## 2021-09-09 DIAGNOSIS — H3563 Retinal hemorrhage, bilateral: Secondary | ICD-10-CM | POA: Diagnosis not present

## 2021-09-12 NOTE — Progress Notes (Addendum)
[place]  [phone]) [phone] [phone]) [phone]    ID: [name] DOB: [birth year]  MR#: [id number]  PQD#:[id number]  Patient Care Team: [name], MD as PCP - General (Internal Medicine) [name], MD as PCP - Advanced Heart Failure (Cardiology) Lorretta Harp, MD as PCP - Cardiology (Cardiology) Alexande Sheerin, [name], MD as Consulting Physician (Hematology and Oncology) Arta Silence, MD as Consulting Physician (Gastroenterology) [name], MD as Consulting Physician (Cardiology) Marzetta Board, DPM as Consulting Physician (Podiatry) Gavin Pound, MD as Consulting Physician (Rheumatology) Jon Billings, RN as [facility] Management Corliss Parish, MD as Consulting Physician (Nephrology) Mcarthur Rossetti, MD as Consulting Physician (Orthopedic Surgery) OTHER MD:   CHIEF COMPLAINT: anemia of renal failure; vasculitis  CURRENT TREATMENT: Retacrit, rituximab   INTERVAL HISTORY: [name] returns today for follow-up and treatment of her anemia of renal failure and vasculitis.    She receives Retacrit every 4 weeks.  She tolerates this well with no side effects that she is aware of.  She of course has been advised regarding risk of clotting with excessive increase in her hemoglobin.  The goal here is to have hemoglobin between 10 and 11.  She is now receiving rituximab every 12 weeks.  She tolerates these treatments remarkably well, with no side effects from the rituximab that she is aware of.  She has a dose due today  We are following her hemoglobin at present: Lab Results  Component Value Date   HGB 9.8 (L) 09/13/2021   HGB 9.3 (L) 08/16/2021   HGB 10.2 (L) 07/19/2021   HGB 10.4 (L) 06/21/2021   HGB 9.2 (L) 05/24/2021   Since her last visit, she underwent bilateral screening mammography with tomography at [facility] on 06/30/2021 showing: breast density category B; no evidence of  malignancy in either breast.   Of note, she also underwent neck CT on 09/02/2021 to evaluate for carotid stenosis. This showed: within limitation of absence of IV contrast, calcified plaque noted at bilateral carotid bifurcations, left greater than right.  She was started on a baby aspirin daily which she is tolerating well   REVIEW OF SYSTEMS: Tranae tells me she stays busy.  She cooks some fried sweet potatoes for Thanksgiving which she spent at her granddaughters in [place].  She does some sewing.  1 day week she feels a little tired the other 6 days she feels just fine she says.  She has no side effects from her EPO injections except that she has to wait about 90 minutes to get them every time she comes.  She also tolerates the rituximab with no side effects that she is aware of.  A detailed review of systems today was otherwise stable   COVID 19 VACCINATION STATUS: Has received [place] x2  with booster x2 as of December 2022  HISTORY OF CURRENT ILLNESS: From the original consult note:  [name] is an 80 y.o. female from [place], [place].  She has a past medical history significant for PAD s/p stenting; moderate to severe pulmonary HTN; afib and AVR on Coumadin; HTN; HLD; DM; and CAD s/p CABG. the patient was admitted to the hospital for anemia.  She was having increasing fatigue and shortness of breath and had routine lab work drawn which showed a hemoglobin of 6.6.  Due to her abnormal lab work, she was advised to come to the emergency room for admission.  She was also admitted in February  2020 for symptomatic anemia.  She reports she has been anemic for at least a year and has been taking oral iron.  She underwent a colonoscopy and EGD on 01/22/2018.  The colonoscopy showed diverticulosis in the sigmoid colon and descending colon, internal hemorrhoids.  The upper endoscopy was normal except for erythematous mucosa in the antrum.  She had a repeat EGD on 11/21/2018 that showed a  few minimally oozing( with scope trauma) superficial gastric ulcers with pigmented material were found in the gastric body. The largest lesion was 4 mm in largest dimension.  She underwent another upper endoscopy on 02/27/2019 which showed patchy mildly friable mucosa with contact bleeding was found in the gastric fundus, in the gastric body and in the gastric antrum.  The patient is maintained on warfarin due to A. fib and a mechanical aortic valve.  Review of the patient's chart shows that she has been anemic dating back to at least December thousand 12 (these are the earliest labs available to me).  Her highest hemoglobin was 11.6 in December 2012.  I also note that the patient has had intermittent mild leukopenia adding back to at least April 2019.  Her lowest white blood cell count was 2.6 in April 2019.   Since admission, the patient has received 1 unit of packed red blood cells.  She is currently on heparin for her procedure with plan to bridge back to Coumadin.  Stool for occult blood was negative x1 on 02/24/2019.  Hematology was asked see the patient to make recommendations regarding her anemia.  [name] subsequent history is as detailed below.   PAST MEDICAL HISTORY: Past Medical History:  Diagnosis Date   Aortic atherosclerosis ([place]) 01/23/2018   Atrial fibrillation ([facility])    Carotid artery disease ([place])    Cholelithiasis 01/23/2018   Chronic anticoagulation    Coronary artery disease    status post coronary artery bypass grafting times 07/10/2004   Diabetes mellitus    Hypercholesteremia    Hypertension    Mechanical heart valve present    H. aortic valve replacement at the time of bypass surgery October 2005   Moderate to severe pulmonary hypertension ([place])    Peripheral arterial disease ([place])    history of left common iliac artery PTA and stenting for a chronic total occlusion 08/26/01    PAST SURGICAL HISTORY: Past Surgical History:  Procedure Laterality Date   anterior  repair  2009   AORTIC VALVE REPLACEMENT  2005   Dr. Cyndia Bent   CARDIAC CATHETERIZATION  11/10/2004   40% right common illiac, 70% in stent restenosis of distal left common illiac,    CARDIAC CATHETERIZATION  05/18/2004   LAD 50-70% midstenosis, RCA dominant w/50% stenosis, 50% Right common Illiac artery ostial stenosis, 90% in stent restenosis within midportion of left common illiac stent   Carotid Duplex  03/12/2012   RSA-elev. velocities suggestive of a 50-69% diameter reduction, Right&Left Bulb/Prox ICA-mild-mod.fibrous plaqueelevating Velocities abnormal study.   CHOLECYSTECTOMY N/A 03/01/2018   Procedure: LAPAROSCOPIC CHOLECYSTECTOMY;  Surgeon: Judeth Horn, MD;  Location: [facility];  Service: General;  Laterality: N/A;   COLONOSCOPY WITH PROPOFOL N/A 01/22/2018   Procedure: COLONOSCOPY WITH PROPOFOL;  Surgeon: Wilford Corner, MD;  Location: [facility];  Service: Endoscopy;  Laterality: N/A;   ESOPHAGOGASTRODUODENOSCOPY (EGD) WITH PROPOFOL N/A 01/22/2018   Procedure: ESOPHAGOGASTRODUODENOSCOPY (EGD) WITH PROPOFOL;  Surgeon: Wilford Corner, MD;  Location: [facility];  Service: Endoscopy;  Laterality: N/A;   ESOPHAGOGASTRODUODENOSCOPY (EGD) WITH PROPOFOL N/A 11/21/2018  Procedure: ESOPHAGOGASTRODUODENOSCOPY (EGD) WITH PROPOFOL;  Surgeon: [name], MD;  Location: [facility];  Service: Gastroenterology;  Laterality: N/A;   ESOPHAGOGASTRODUODENOSCOPY (EGD) WITH PROPOFOL Left 02/27/2019   Procedure: ESOPHAGOGASTRODUODENOSCOPY (EGD) WITH PROPOFOL;  Surgeon: Arta Silence, MD;  Location: [facility]COPY;  Service: Endoscopy;  Laterality: Left;   GIVENS CAPSULE STUDY N/A 11/21/2018   Procedure: GIVENS CAPSULE STUDY;  Surgeon: [name], MD;  Location: [facility];  Service: Gastroenterology;  Laterality: N/A;  To be deployed during EGD   Lower Ext. Duplex  03/12/2012   Right Proximal CIA- vessel narrowing w/elevated velocities 0-49% diameter reduction. Right SFA-mild mixed density plaque  throughout vessel.   NM MYOCAR PERF WALL MOTION  05/19/2010   protocol: Persantine, post stress EF 65%, negative for ischemia, low risk scan   RIGHT HEART CATH N/A 06/27/2018   Procedure: RIGHT HEART CATH;  Surgeon: [name], MD;  Location: [facility];  Service: Cardiovascular;  Laterality: N/A;   TOTAL ABDOMINAL HYSTERECTOMY W/ BILATERAL SALPINGOOPHORECTOMY  1989   TRANSTHORACIC ECHOCARDIOGRAM  08/29/2012   Moderately calcified annulus of mitral valve, moderate regurg. of both mitral valve and tricuspid valve.     FAMILY HISTORY: Family History  Problem Relation Age of Onset   Heart disease Father    Hypertension Father    Hyperlipidemia Father    Breast cancer Neg Hx     GYNECOLOGIC HISTORY:  No LMP recorded. Patient has had a hysterectomy. Menarche:  years old Age at first live birth:  [place] P: 3 LMP:  Contraceptive:  HRT:   Hysterectomy?: yes BSO?:    SOCIAL HISTORY: (Current as of 03/07/2019) Silvanna is widowed.  She lives by herself.  She has 3 children that live locally.  She smoked three quarters of a pack of cigarettes daily for 50 years.  She quit in 2005.  Denies alcohol use.   ADVANCED DIRECTIVES: Not in place    HEALTH MAINTENANCE: Social History   Tobacco Use   Smoking status: Former    Packs/day: 1.00    Years: 30.00    Pack years: 30.00    Types: Cigarettes   Smokeless tobacco: Never   Tobacco comments:    quit smoking 2005  Vaping Use   Vaping Use: Never used  Substance Use Topics   Alcohol use: No    Alcohol/week: 0.0 standard drinks   Drug use: No     Colonoscopy: EGD on 02/27/2019 under Dr. Paulita Fujita found some friable gastric mucosa.  Colonoscopy under Dr. Michail Sermon 01/22/2018 showed significant diverticular disease  PAP:   Bone density: May 2017 showed osteopenia   Allergies  Allergen Reactions   Flagyl [Metronidazole] Rash    ALL-OVER BODY RASH   Coreg [Carvedilol] Other (See Comments)    Terrible cramping in the feet  and had a lot of bowel movements, but not diarrhea   Losartan Swelling    Patient doesn't recall site of swelling   Sulfamethoxazole-Trimethoprim     Other reaction(s): stomach upset .   Verapamil Hives   Zetia [Ezetimibe] Other (See Comments)    Reaction not recalled   Zocor [Simvastatin - High Dose] Other (See Comments)    Reaction not recalled   Gatifloxacin Rash    Redness to skin around eye    Current Outpatient Medications  Medication Sig Dispense Refill   acetaminophen (TYLENOL) 325 MG tablet Take 650 mg by mouth every 6 (six) hours as needed for mild pain.      allopurinol (ZYLOPRIM) 100 MG tablet  Take 100 mg by mouth daily.     ALPRAZolam (XANAX) 0.5 MG tablet Take 0.25 mg by mouth at bedtime.     ALPRAZolam (XANAX) 0.5 MG tablet Take 1 tablet by mouth at bedtime as needed.     amLODipine (NORVASC) 10 MG tablet TAKE 1 TABLET EVERY DAY 90 tablet 3   atorvastatin (LIPITOR) 80 MG tablet Take 1 tablet (80 mg total) by mouth daily at 6 PM.     Blood Glucose Monitoring Suppl (TRUE METRIX METER) w/Device KIT      Cholecalciferol (VITAMIN D3) 1000 UNITS CAPS Take 2,000 Units by mouth daily with lunch.      ciprofloxacin (CIPRO) 500 MG tablet Take 500 mg by mouth daily as needed.     cloNIDine (CATAPRES) 0.2 MG tablet TAKE 1 TABLET TWICE DAILY 180 tablet 3   conjugated estrogens (PREMARIN) vaginal cream Place 1 Applicatorful vaginally 2 (two) times a week. Place 0.5g nightly twice a week 30 g 11   Cyanocobalamin (VITAMIN B12) 1000 MCG TBCR Take 1,000 mcg by mouth daily with lunch.     furosemide (LASIX) 40 MG tablet TAKE 2 TABLETS (80 MG TOTAL) IN MORNING AND 1 AND 1/2 TABLETS (60 MG TOTAL) IN EVENING. CHANGE IN DOSAGE. 315 tablet 0   glucose blood (TRUE METRIX BLOOD GLUCOSE TEST) test strip Use 1-2 times daily     hydrALAZINE (APRESOLINE) 100 MG tablet TAKE 1 TABLET THREE TIMES DAILY 270 tablet 3   lactose free nutrition (BOOST) LIQD Take 237 mLs by mouth daily. (To supplement meals)       magnesium oxide (MAG-OX) 400 (241.3 Mg) MG tablet Take 1 tablet (400 mg total) by mouth daily.     metoprolol succinate (TOPROL-XL) 25 MG 24 hr tablet TAKE 1/2 TABLET EVERY DAY 45 tablet 3   pantoprazole (PROTONIX) 40 MG tablet Take 40 mg by mouth 2 (two) times daily.      spironolactone (ALDACTONE) 25 MG tablet TAKE 1 TABLET EVERY DAY 90 tablet 3   tadalafil, PAH, (ADCIRCA) 20 MG tablet Take 2 tablets (40 mg total) by mouth daily. 60 tablet 11   TRUE METRIX BLOOD GLUCOSE TEST test strip SMARTSIG:Via Meter     vitamin C (ASCORBIC ACID) 500 MG tablet Take 500 mg by mouth daily with lunch.     warfarin (COUMADIN) 5 MG tablet TAKE 1 AND 1/2 TABLETS TO 2 TABLETS DAILY AS DIRECTED BY COUMADIN CLINIC 180 tablet 0   No current facility-administered medications for this visit.    OBJECTIVE: White woman in no acute distress Vitals:   09/13/21 1012  BP: (!) 171/35  Pulse: 60  Resp: 16  Temp: 97.6 F (36.4 C)  SpO2: 98%   Wt Readings from Last 3 Encounters:  09/13/21 125 lb 9.6 oz (57 kg)  09/07/21 126 lb (57.2 kg)  09/06/21 126 lb 9.6 oz (57.4 kg)   Body mass index is 25.37 kg/m.    ECOG FS:1 - Symptomatic but completely ambulatory  Sclerae unicteric, EOMs intact Wearing a mask No cervical or supraclavicular adenopathy Lungs no rales or rhonchi Heart regular rate and rhythm Abd soft, nontender, positive bowel sounds MSK no focal spinal tenderness, no upper extremity lymphedema Neuro: nonfocal, well oriented, appropriate affect Breasts: Deferred   LAB RESULTS:  CMP     Component Value Date/Time   NA 134 (L) 05/24/2021 1414   NA 137 12/06/2017 1436   K 4.4 05/24/2021 1414   CL 104 05/24/2021 1414   CO2 20 (L) 05/24/2021  1414   GLUCOSE 195 (H) 05/24/2021 1414   BUN 84 (H) 05/24/2021 1414   BUN 20 12/06/2017 1436   CREATININE 2.50 (H) 05/24/2021 1414   CALCIUM 9.2 05/24/2021 1414   PROT 6.4 (L) 05/24/2021 1414   ALBUMIN 3.7 05/24/2021 1414   AST 20 05/24/2021 1414    ALT 14 05/24/2021 1414   ALKPHOS 91 05/24/2021 1414   BILITOT 0.4 05/24/2021 1414   GFRNONAA 19 (L) 05/24/2021 1414   GFRAA 32 (L) 03/30/2020 0932    Lab Results  Component Value Date   TOTALPROTELP 6.9 05/07/2019    No results found for: KPAFRELGTCHN, LAMBDASER, KAPLAMBRATIO  Lab Results  Component Value Date   WBC 5.4 09/13/2021   NEUTROABS 3.6 09/13/2021   HGB 9.8 (L) 09/13/2021   HCT 30.8 (L) 09/13/2021   MCV 94.5 09/13/2021   PLT 161 09/13/2021    No results found for: LABCA2  No components found for: SVXBLT903  No results for input(s): INR in the last 168 hours.   No results found for: LABCA2  No results found for: ESP233  No results found for: AQT622  No results found for: QJF354  No results found for: CA2729  No components found for: HGQUANT  No results found for: CEA1 / No results found for: CEA1   No results found for: AFPTUMOR  No results found for: CHROMOGRNA  No results found for: HGBA, HGBA2QUANT, HGBFQUANT, HGBSQUAN (Hemoglobinopathy evaluation)   Lab Results  Component Value Date   LDH 212 (H) 08/04/2019    Lab Results  Component Value Date   IRON 123 03/30/2020   TIBC 268 03/30/2020   IRONPCTSAT 46 03/30/2020   (Iron and TIBC)  Lab Results  Component Value Date   FERRITIN 359 (H) 08/16/2021    Urinalysis    Component Value Date/Time   COLORURINE YELLOW 07/07/2019 0209   APPEARANCEUR CLOUDY (A) 07/07/2019 0209   LABSPEC 1.011 07/07/2019 0209   PHURINE 6.0 07/07/2019 0209   GLUCOSEU NEGATIVE 07/07/2019 0209   HGBUR NEGATIVE 07/07/2019 0209   BILIRUBINUR Negative 09/07/2021 1054   [place] 07/07/2019 0209   PROTEINUR Positive (A) 09/07/2021 1054   PROTEINUR >=300 (A) 07/07/2019 0209   UROBILINOGEN 0.2 09/07/2021 1054   UROBILINOGEN 1.0 10/06/2011 1709   NITRITE Negative 09/07/2021 1054   NITRITE NEGATIVE 07/07/2019 0209   LEUKOCYTESUR Trace (A) 09/07/2021 1054   LEUKOCYTESUR LARGE (A) 07/07/2019 0209     STUDIES:  CT SOFT TISSUE NECK WO CONTRAST  Result Date: 09/03/2021 CLINICAL DATA:  Carotid stenosis screening, no risk factors EXAM: CT NECK WITHOUT CONTRAST TECHNIQUE: Multidetector CT imaging of the neck was performed following the standard protocol without intravenous contrast. COMPARISON:  No prior CTA, correlation is made with 08/10/2003 MRA. FINDINGS: Evaluation is somewhat limited by motion artifact. Pharynx and larynx: Normal. No mass or swelling. Salivary glands: No inflammation, mass, or stone. Thyroid: Subcentimeter hypoattenuating nodules.  Otherwise normal. Lymph nodes: None enlarged or abnormal density. Vascular: Significant plaque is seen in the left-greater-than-right proximal internal carotid arteries, although this cannot be quantified given the absence of intravenous contrast. Limited intracranial: Negative. Visualized orbits: Status post bilateral lens replacements. Otherwise negative. Mastoids and visualized paranasal sinuses: Clear. Skeleton: Degenerative changes in the cervical spine. Status post median sternotomy. No acute osseous abnormality. Upper chest: No focal pulmonary opacity or pleural effusion. Aortic atherosclerosis. Other: None. IMPRESSION: 1. Evaluation for carotid stenosis is significantly limited by the absence of intravenous contrast. Calcified plaque is noted at the bilateral carotid  bifurcations, left greater than right. If quantification is desired, consider ultrasound or MRA. 2. Subcentimeter hypoattenuating nodules in the thyroid, for which no follow-up is recommended. (Reference: J Am Coll Radiol. 2015 Feb;12(2): 143-50) 3. Evaluation is somewhat limited by motion artifact. Within this limitation, no acute in the neck. 4.  Aortic Atherosclerosis (ICD10-I70.0). Electronically Signed   By: Merilyn Baba M.D.   On: 09/03/2021 00:18   VAS US CAROTID  Result Date: 08/23/2021 Carotid Arterial Duplex Study Patient Name:  ARIE GABLE  Date of Exam:   08/23/2021  Medical Rec #: [id number]       Accession #:    [id number] Date of Birth: November 04, 1940       Patient Gender: F Patient Age:   65 years Exam Location:  [facility] Procedure:      VAS US CAROTID Referring Phys: Monica Martinez --------------------------------------------------------------------------------  Indications:                               Carotid artery disease. Limitations                                Today's exam was limited due to heavy                                            calcification and the resulting                                            shadowing. Comparison Study:                          05/19/21 at [facility]: Right ICA                                            379/45 cm/s Left ICA 510/101 cm/s Pre-Surgical Evaluation & Surgical         ICA only, cannot rule out ECA Correlation                                occlusionICA is normal past the                                            stenosis. Anatomy on the left is                                            within normal limits.Left bifurcation                                            is located near the Hyoid Notch. Performing Technologist:  Ralene Cork RVT  Examination Guidelines: A complete evaluation includes B-mode imaging, spectral Doppler, color Doppler, and power Doppler as needed of all accessible portions of each vessel. Bilateral testing is considered an integral part of a complete examination. Limited examinations for reoccurring indications may be performed as noted.  Right Carotid Findings: +----------+--------+--------+--------+-------------------------+--------+           PSV cm/sEDV cm/sStenosisPlaque Description       Comments +----------+--------+--------+--------+-------------------------+--------+ CCA Prox  173     32              heterogenous                      +----------+--------+--------+--------+-------------------------+--------+ CCA Mid   163     18               heterogenous                      +----------+--------+--------+--------+-------------------------+--------+ CCA Distal115     17              heterogenous                      +----------+--------+--------+--------+-------------------------+--------+ ICA Prox  299     46      40-59%  heterogenous and calcific         +----------+--------+--------+--------+-------------------------+--------+ ICA Mid   148     20                                                +----------+--------+--------+--------+-------------------------+--------+ ICA Distal117     15                                                +----------+--------+--------+--------+-------------------------+--------+ ECA       514     10      >50%    heterogenous and calcific         +----------+--------+--------+--------+-------------------------+--------+ +----------+--------+-------+--------+-------------------+           PSV cm/sEDV cmsDescribeArm Pressure (mmHG) +----------+--------+-------+--------+-------------------+ [id number]            Stenotic                    +----------+--------+-------+--------+-------------------+ +---------+--------+--+--------+--+-----------------------------------+ VertebralPSV cm/s83EDV PH/[id number] and Systolic deceleration +---------+--------+--+--------+--+-----------------------------------+  Left Carotid Findings: +----------+--------+--------+--------+------------------+---------------------+           PSV cm/sEDV cm/sStenosisPlaque DescriptionComments              +----------+--------+--------+--------+------------------+---------------------+ CCA Prox  136     23              heterogenous                            +----------+--------+--------+--------+------------------+---------------------+ CCA Mid   98      17              heterogenous                             +----------+--------+--------+--------+------------------+---------------------+ CCA Distal159     27  heterogenous                            +----------+--------+--------+--------+------------------+---------------------+ ICA Prox  513     99      60-79%  calcific          Stenosis is likely in                                                     the 80-99% range.                                                         Shadowing may obscure                                                     higher velocities.    +----------+--------+--------+--------+------------------+---------------------+ ICA Mid   162     30                                                      +----------+--------+--------+--------+------------------+---------------------+ ICA Distal117     25                                                      +----------+--------+--------+--------+------------------+---------------------+ ECA       110     0               calcific          appears occluded at                                                       the origin with                                                           retrograde filling                                                        via branch.           +----------+--------+--------+--------+------------------+---------------------+ +----------+--------+--------+----------------+-------------------+           PSV cm/sEDV cm/sDescribe  Arm Pressure (mmHG) +----------+--------+--------+----------------+-------------------+ [id number]             Multiphasic, WNL                    +----------+--------+--------+----------------+-------------------+ +---------+--------+--------+----------+ VertebralPSV cm/sEDV cm/sRetrograde +---------+--------+--------+----------+   Summary: Right Carotid: Velocities in the right ICA are consistent with a 40-59% stenosis                by  end diastolic criteria and plaque morphology. Peak systolic                velocity indicates a higher degree of stenosis. The ECA appears                >50% stenosed. Left Carotid: Velocities in the left ICA are consistent with a 60-79% stenosis.               This may be underestimated due to calcific plaque and shadowing.               Stenosis is likely in the 80-99% range. Cannot rule out an               occlusion of the ECA origin with retrograde filling via a branch. Vertebrals:  Right vertebral artery demonstrates antegrade flow. Left vertebral              artery demonstrates retrograde flow. Subclavians: Right subclavian artery was stenotic. Normal flow hemodynamics were              seen in the left subclavian artery. *See table(s) above for measurements and observations.  Electronically signed by Monica Martinez MD on 08/23/2021 at 10:24:56 AM.    Final      ELIGIBLE FOR AVAILABLE RESEARCH PROTOCOL: no   ASSESSMENT: 80 y.o. [place] woman with multifactorial anemia, as follows:             (a) anemia of chronic inflammation: As of May 2020 she has a SED rate of 95, CRP 1.1, positive ANA and RF; subsequently she was found to be ANCA positive             (b) hemolysis (mild): she has a mildly elevated LDH and a DAT positive for complement, not IgG; this is c/w (a) above             (c) anemia of renal insufficiency: inadequate EPO resonse to anemia              (d) GIB/ iron deficiency in patient on lifelong anticoagulaton  (1) Feraheme received 03/01/2019, repeated 03/20/2019   (a) reticulocyte 03/07/2019 up from 43.4 a year ago to 191.8 after iron infusion  (b) feraheme repeated on 05/12/2019  (c) subsequent ferritin levels >100  (2) darbepoetin: starting 05/05/2019 at 171mg given every 2 weeks  (a) dose increased to 200 mcg every week starting 07/01/2019   (b) back to every 2 weeks beginning 08/04/2019 still at 200 mcg dose  (c) switched to Retacrit every 4 weeks as of  08/18/2019  (3) ANCA positive vasculitis: diagnosed 05/08/2019 (titer 1:640)  (a) prednisone 40 mg daily started 05/31/2019  (b) rituximab weekly started 06/12/2019 (received test dose 06/05/2019)   (i) hepatitis B sAg, core Ab and HIV negative 05/08/2019   (ii) rituximab held after 07/14/2019 dose (received 5 weekly doses) with neutropenia   (iii) rituximab resumed 08/18/2019 but changed to monthly   (iv) rituximab changed to every 2 months beginning with 02/02/2020 dose   (v) rituximab  changed to every 12 weeks after the 10/12/2020 dose  (c) prednisone tapered to off as of 09/15/2019  (4) osteopenia with a T score of -1.5 on bone density 02/28/2016 at the [facility]    PLAN: Marikay has been on rituximab now a little over 2 years.  She receives that every 3 months.  She tolerates this well, with no side effects that she is aware of.  This is keeping her hemoglobin over 9.  She also receives erythropoietin every 4 weeks.  Accordingly we are continuing treatment as before.  She will have rituximab today and her next dose will be December 06, 2021.  She will meet her new hematologist, Dr. Leone Payor at that time  She tells me Dr. Osborne Casco has suggested Prolia for her but she will not be able to afford it.  She will doubtless discuss other options with him at their next visit.  Total encounter time 20 minutes.Sarajane Jews C. Lavonne Cass, MD 09/13/21 10:28 AM Medical Oncology and Hematology [facility] [place], [place] [address] Tel. [phone]    Fax. [phone]   I, Wilburn Mylar, am acting as scribe for Dr. [name]. Rayley Gao.  I, Lurline Del MD, have reviewed the above documentation for accuracy and completeness, and I agree with the above.   *Total Encounter Time as defined by the Centers for Medicare and Medicaid Services includes, in addition to the face-to-face time of a patient visit (documented in the note above) non-face-to-face time:  obtaining and reviewing outside history, ordering and reviewing medications, tests or procedures, care coordination (communications with other health care professionals or caregivers) and documentation in the medical record.

## 2021-09-13 ENCOUNTER — Inpatient Hospital Stay: Payer: Medicare HMO

## 2021-09-13 ENCOUNTER — Other Ambulatory Visit: Payer: Self-pay

## 2021-09-13 ENCOUNTER — Inpatient Hospital Stay: Payer: Medicare HMO | Attending: Oncology

## 2021-09-13 ENCOUNTER — Inpatient Hospital Stay (HOSPITAL_BASED_OUTPATIENT_CLINIC_OR_DEPARTMENT_OTHER): Payer: Medicare HMO | Admitting: Oncology

## 2021-09-13 VITALS — BP 171/35 | HR 60 | Temp 97.6°F | Resp 16 | Ht 59.0 in | Wt 125.6 lb

## 2021-09-13 VITALS — BP 170/56 | HR 55 | Temp 98.2°F | Resp 16

## 2021-09-13 DIAGNOSIS — D594 Other nonautoimmune hemolytic anemias: Secondary | ICD-10-CM

## 2021-09-13 DIAGNOSIS — D696 Thrombocytopenia, unspecified: Secondary | ICD-10-CM | POA: Diagnosis not present

## 2021-09-13 DIAGNOSIS — R7402 Elevation of levels of lactic acid dehydrogenase (LDH): Secondary | ICD-10-CM | POA: Diagnosis not present

## 2021-09-13 DIAGNOSIS — D631 Anemia in chronic kidney disease: Secondary | ICD-10-CM | POA: Diagnosis not present

## 2021-09-13 DIAGNOSIS — N184 Chronic kidney disease, stage 4 (severe): Secondary | ICD-10-CM

## 2021-09-13 DIAGNOSIS — I776 Arteritis, unspecified: Secondary | ICD-10-CM | POA: Insufficient documentation

## 2021-09-13 DIAGNOSIS — D61818 Other pancytopenia: Secondary | ICD-10-CM | POA: Diagnosis not present

## 2021-09-13 DIAGNOSIS — I7782 Antineutrophilic cytoplasmic antibody (ANCA) vasculitis: Secondary | ICD-10-CM

## 2021-09-13 DIAGNOSIS — Z5112 Encounter for antineoplastic immunotherapy: Secondary | ICD-10-CM | POA: Insufficient documentation

## 2021-09-13 DIAGNOSIS — D500 Iron deficiency anemia secondary to blood loss (chronic): Secondary | ICD-10-CM

## 2021-09-13 DIAGNOSIS — N183 Chronic kidney disease, stage 3 unspecified: Secondary | ICD-10-CM | POA: Diagnosis not present

## 2021-09-13 DIAGNOSIS — I739 Peripheral vascular disease, unspecified: Secondary | ICD-10-CM

## 2021-09-13 DIAGNOSIS — D508 Other iron deficiency anemias: Secondary | ICD-10-CM

## 2021-09-13 DIAGNOSIS — N289 Disorder of kidney and ureter, unspecified: Secondary | ICD-10-CM

## 2021-09-13 DIAGNOSIS — M8580 Other specified disorders of bone density and structure, unspecified site: Secondary | ICD-10-CM | POA: Insufficient documentation

## 2021-09-13 LAB — CBC WITH DIFFERENTIAL/PLATELET
Abs Immature Granulocytes: 0.06 10*3/uL (ref 0.00–0.07)
Basophils Absolute: 0.1 10*3/uL (ref 0.0–0.1)
Basophils Relative: 1 %
Eosinophils Absolute: 0.2 10*3/uL (ref 0.0–0.5)
Eosinophils Relative: 4 %
HCT: 30.8 % — ABNORMAL LOW (ref 36.0–46.0)
Hemoglobin: 9.8 g/dL — ABNORMAL LOW (ref 12.0–15.0)
Immature Granulocytes: 1 %
Lymphocytes Relative: 19 %
Lymphs Abs: 1 10*3/uL (ref 0.7–4.0)
MCH: 30.1 pg (ref 26.0–34.0)
MCHC: 31.8 g/dL (ref 30.0–36.0)
MCV: 94.5 fL (ref 80.0–100.0)
Monocytes Absolute: 0.4 10*3/uL (ref 0.1–1.0)
Monocytes Relative: 8 %
Neutro Abs: 3.6 10*3/uL (ref 1.7–7.7)
Neutrophils Relative %: 67 %
Platelets: 161 10*3/uL (ref 150–400)
RBC: 3.26 MIL/uL — ABNORMAL LOW (ref 3.87–5.11)
RDW: 15.5 % (ref 11.5–15.5)
WBC: 5.4 10*3/uL (ref 4.0–10.5)
nRBC: 0 % (ref 0.0–0.2)

## 2021-09-13 LAB — SAVE SMEAR(SSMR), FOR PROVIDER SLIDE REVIEW

## 2021-09-13 LAB — RETICULOCYTES
Immature Retic Fract: 4.3 % (ref 2.3–15.9)
RBC.: 3.27 MIL/uL — ABNORMAL LOW (ref 3.87–5.11)
Retic Count, Absolute: 44.5 10*3/uL (ref 19.0–186.0)
Retic Ct Pct: 1.4 % (ref 0.4–3.1)

## 2021-09-13 LAB — FERRITIN: Ferritin: 231 ng/mL (ref 11–307)

## 2021-09-13 MED ORDER — ACETAMINOPHEN 325 MG PO TABS
650.0000 mg | ORAL_TABLET | Freq: Once | ORAL | Status: AC
  Administered 2021-09-13: 650 mg via ORAL
  Filled 2021-09-13: qty 2

## 2021-09-13 MED ORDER — SODIUM CHLORIDE 0.9 % IV SOLN
375.0000 mg/m2 | Freq: Once | INTRAVENOUS | Status: AC
  Administered 2021-09-13: 600 mg via INTRAVENOUS
  Filled 2021-09-13: qty 10

## 2021-09-13 MED ORDER — DIPHENHYDRAMINE HCL 25 MG PO CAPS
25.0000 mg | ORAL_CAPSULE | Freq: Once | ORAL | Status: AC
  Administered 2021-09-13: 25 mg via ORAL
  Filled 2021-09-13: qty 1

## 2021-09-13 MED ORDER — EPOETIN ALFA-EPBX 40000 UNIT/ML IJ SOLN
40000.0000 [IU] | Freq: Once | INTRAMUSCULAR | Status: AC
  Administered 2021-09-13: 40000 [IU] via SUBCUTANEOUS
  Filled 2021-09-13: qty 1

## 2021-09-13 MED ORDER — SODIUM CHLORIDE 0.9 % IV SOLN
Freq: Once | INTRAVENOUS | Status: AC
Start: 2021-09-13 — End: 2021-09-13

## 2021-09-13 MED ORDER — EPOETIN ALFA-EPBX 20000 UNIT/ML IJ SOLN
20000.0000 [IU] | Freq: Once | INTRAMUSCULAR | Status: AC
  Administered 2021-09-13: 20000 [IU] via SUBCUTANEOUS
  Filled 2021-09-13: qty 1

## 2021-09-13 NOTE — Patient Instructions (Signed)
[facility] ONCOL[facility]   Discharge Instructions: Thank you for choosing [facility] to provide your oncology and hematology care.   If you have a lab appointment with the [facility], please go directly to the [facility] and check in at the registration area.   Wear comfortable clothing and clothing appropriate for easy access to any Portacath or PICC line.   We strive to give you quality time with your provider. You may need to reschedule your appointment if you arrive late (15 or more minutes).  Arriving late affects you and other patients whose appointments are after yours.  Also, if you miss three or more appointments without notifying the office, you may be dismissed from the clinic at the provider's discretion.      For prescription refill requests, have your pharmacy contact our office and allow 72 hours for refills to be completed.    Today you received the following chemotherapy and/or immunotherapy agents: rituximab.      To help prevent nausea and vomiting after your treatment, we encourage you to take your nausea medication as directed.  BELOW ARE SYMPTOMS THAT SHOULD BE REPORTED IMMEDIATELY: *FEVER GREATER THAN 100.4 F (38 C) OR HIGHER *CHILLS OR SWEATING *NAUSEA AND VOMITING THAT IS NOT CONTROLLED WITH YOUR NAUSEA MEDICATION *UNUSUAL SHORTNESS OF BREATH *UNUSUAL BRUISING OR BLEEDING *URINARY PROBLEMS (pain or burning when urinating, or frequent urination) *BOWEL PROBLEMS (unusual diarrhea, constipation, pain near the anus) TENDERNESS IN MOUTH AND THROAT WITH OR WITHOUT PRESENCE OF ULCERS (sore throat, sores in mouth, or a toothache) UNUSUAL RASH, SWELLING OR PAIN  UNUSUAL VAGINAL DISCHARGE OR ITCHING   Items with * indicate a potential emergency and should be followed up as soon as possible or go to the Emergency Department if any problems should occur.  Please show the CHEMOTHERAPY ALERT CARD or IMMUNOTHERAPY ALERT CARD at check-in  to the Emergency Department and triage nurse.  Should you have questions after your visit or need to cancel or reschedule your appointment, please contact [facility]  Dept: [phone]  and follow the prompts.  Office hours are 8:00 a.m. to 4:30 p.m. Monday - Friday. Please note that voicemails left after 4:00 p.m. may not be returned until the following business day.  We are closed weekends and major holidays. You have access to a nurse at all times for urgent questions. Please call the main number to the clinic Dept: [phone] and follow the prompts.   For any non-urgent questions, you may also contact your provider using MyChart. We now offer e-Visits for anyone 82 and older to request care online for non-urgent symptoms. For details visit [email or web address].   Also download the MyChart app! Go to the app store, search "MyChart", open the app, select [facility], and log in with your MyChart username and password.  Due to Covid, a mask is required upon entering the hospital/clinic. If you do not have a mask, one will be given to you upon arrival. For doctor visits, patients may have 1 support person aged 82 or older with them. For treatment visits, patients cannot have anyone with them due to current Covid guidelines and our immunocompromised population.

## 2021-09-19 ENCOUNTER — Other Ambulatory Visit: Payer: Self-pay | Admitting: Obstetrics and Gynecology

## 2021-09-19 ENCOUNTER — Telehealth: Payer: Self-pay | Admitting: Obstetrics and Gynecology

## 2021-09-19 ENCOUNTER — Inpatient Hospital Stay (HOSPITAL_COMMUNITY): Admission: RE | Admit: 2021-09-19 | Payer: Medicare HMO | Source: Ambulatory Visit

## 2021-09-19 DIAGNOSIS — N952 Postmenopausal atrophic vaginitis: Secondary | ICD-10-CM

## 2021-09-19 MED ORDER — ESTRADIOL 0.1 MG/GM VA CREA: TOPICAL_CREAM | VAGINAL | 11 refills | Status: DC

## 2021-09-20 NOTE — Telephone Encounter (Signed)
3 attempts made. Line was busy. No VM for me to leave a message. Will try again tomorrow.

## 2021-09-21 DIAGNOSIS — D631 Anemia in chronic kidney disease: Secondary | ICD-10-CM | POA: Diagnosis not present

## 2021-09-21 DIAGNOSIS — I5032 Chronic diastolic (congestive) heart failure: Secondary | ICD-10-CM | POA: Diagnosis not present

## 2021-09-21 DIAGNOSIS — I7782 Antineutrophilic cytoplasmic antibody (ANCA) vasculitis: Secondary | ICD-10-CM | POA: Diagnosis not present

## 2021-09-21 DIAGNOSIS — M109 Gout, unspecified: Secondary | ICD-10-CM | POA: Diagnosis not present

## 2021-09-21 DIAGNOSIS — E1122 Type 2 diabetes mellitus with diabetic chronic kidney disease: Secondary | ICD-10-CM | POA: Diagnosis not present

## 2021-09-21 DIAGNOSIS — I129 Hypertensive chronic kidney disease with stage 1 through stage 4 chronic kidney disease, or unspecified chronic kidney disease: Secondary | ICD-10-CM | POA: Diagnosis not present

## 2021-09-21 DIAGNOSIS — N184 Chronic kidney disease, stage 4 (severe): Secondary | ICD-10-CM | POA: Diagnosis not present

## 2021-09-23 ENCOUNTER — Ambulatory Visit (INDEPENDENT_AMBULATORY_CARE_PROVIDER_SITE_OTHER): Payer: Medicare HMO

## 2021-09-23 ENCOUNTER — Other Ambulatory Visit: Payer: Self-pay

## 2021-09-23 DIAGNOSIS — Z952 Presence of prosthetic heart valve: Secondary | ICD-10-CM | POA: Diagnosis not present

## 2021-09-23 DIAGNOSIS — Z7901 Long term (current) use of anticoagulants: Secondary | ICD-10-CM

## 2021-09-23 DIAGNOSIS — Z5181 Encounter for therapeutic drug level monitoring: Secondary | ICD-10-CM

## 2021-09-23 LAB — POCT INR: INR: 4.9 — AB (ref 2.0–3.0)

## 2021-09-23 NOTE — Patient Instructions (Signed)
Description   Do not take any Warfarin today or tomorrow then START taking Warfarin 1.5 tablets (7.5mg ) daily except 1 tablet on Mondays, Wednesdays, and Fridays. Please keep green leafy veggies and boost in your diet. Recheck INR in 2 weeks. [phone]

## 2021-10-07 ENCOUNTER — Other Ambulatory Visit: Payer: Self-pay

## 2021-10-07 ENCOUNTER — Ambulatory Visit (INDEPENDENT_AMBULATORY_CARE_PROVIDER_SITE_OTHER): Payer: Medicare HMO

## 2021-10-07 DIAGNOSIS — Z952 Presence of prosthetic heart valve: Secondary | ICD-10-CM | POA: Diagnosis not present

## 2021-10-07 DIAGNOSIS — Z5181 Encounter for therapeutic drug level monitoring: Secondary | ICD-10-CM

## 2021-10-07 DIAGNOSIS — I482 Chronic atrial fibrillation, unspecified: Secondary | ICD-10-CM | POA: Diagnosis not present

## 2021-10-07 DIAGNOSIS — N184 Chronic kidney disease, stage 4 (severe): Secondary | ICD-10-CM | POA: Diagnosis not present

## 2021-10-07 DIAGNOSIS — Z7901 Long term (current) use of anticoagulants: Secondary | ICD-10-CM

## 2021-10-07 DIAGNOSIS — I130 Hypertensive heart and chronic kidney disease with heart failure and stage 1 through stage 4 chronic kidney disease, or unspecified chronic kidney disease: Secondary | ICD-10-CM | POA: Diagnosis not present

## 2021-10-07 DIAGNOSIS — I5032 Chronic diastolic (congestive) heart failure: Secondary | ICD-10-CM | POA: Diagnosis not present

## 2021-10-07 LAB — POCT INR: INR: 2 (ref 2.0–3.0)

## 2021-10-07 NOTE — Patient Instructions (Signed)
-   continue taking Warfarin 1.5 tablets (7.5mg ) daily except 1 tablet on Mondays, Wednesdays, and Fridays. Please keep green leafy veggies and boost in your diet.  - Recheck INR in 4 weeks.  [phone]

## 2021-10-11 ENCOUNTER — Inpatient Hospital Stay: Payer: Medicare HMO | Attending: Oncology

## 2021-10-11 ENCOUNTER — Other Ambulatory Visit: Payer: Self-pay

## 2021-10-11 ENCOUNTER — Inpatient Hospital Stay: Payer: Medicare HMO

## 2021-10-11 VITALS — BP 159/62 | HR 57 | Temp 97.8°F | Resp 16

## 2021-10-11 DIAGNOSIS — N184 Chronic kidney disease, stage 4 (severe): Secondary | ICD-10-CM | POA: Insufficient documentation

## 2021-10-11 DIAGNOSIS — D508 Other iron deficiency anemias: Secondary | ICD-10-CM

## 2021-10-11 DIAGNOSIS — D631 Anemia in chronic kidney disease: Secondary | ICD-10-CM | POA: Insufficient documentation

## 2021-10-11 DIAGNOSIS — I776 Arteritis, unspecified: Secondary | ICD-10-CM | POA: Insufficient documentation

## 2021-10-11 DIAGNOSIS — D500 Iron deficiency anemia secondary to blood loss (chronic): Secondary | ICD-10-CM

## 2021-10-11 DIAGNOSIS — D696 Thrombocytopenia, unspecified: Secondary | ICD-10-CM

## 2021-10-11 DIAGNOSIS — D594 Other nonautoimmune hemolytic anemias: Secondary | ICD-10-CM

## 2021-10-11 DIAGNOSIS — I739 Peripheral vascular disease, unspecified: Secondary | ICD-10-CM

## 2021-10-11 LAB — CBC WITH DIFFERENTIAL/PLATELET
Abs Immature Granulocytes: 0.05 10*3/uL (ref 0.00–0.07)
Basophils Absolute: 0.1 10*3/uL (ref 0.0–0.1)
Basophils Relative: 1 %
Eosinophils Absolute: 0.3 10*3/uL (ref 0.0–0.5)
Eosinophils Relative: 5 %
HCT: 30.5 % — ABNORMAL LOW (ref 36.0–46.0)
Hemoglobin: 9.9 g/dL — ABNORMAL LOW (ref 12.0–15.0)
Immature Granulocytes: 1 %
Lymphocytes Relative: 17 %
Lymphs Abs: 1.1 10*3/uL (ref 0.7–4.0)
MCH: 30.9 pg (ref 26.0–34.0)
MCHC: 32.5 g/dL (ref 30.0–36.0)
MCV: 95.3 fL (ref 80.0–100.0)
Monocytes Absolute: 0.6 10*3/uL (ref 0.1–1.0)
Monocytes Relative: 9 %
Neutro Abs: 4.5 10*3/uL (ref 1.7–7.7)
Neutrophils Relative %: 67 %
Platelets: 159 10*3/uL (ref 150–400)
RBC: 3.2 MIL/uL — ABNORMAL LOW (ref 3.87–5.11)
RDW: 15.2 % (ref 11.5–15.5)
WBC: 6.7 10*3/uL (ref 4.0–10.5)
nRBC: 0 % (ref 0.0–0.2)

## 2021-10-11 LAB — RETICULOCYTES
Immature Retic Fract: 7.3 % (ref 2.3–15.9)
RBC.: 3.18 MIL/uL — ABNORMAL LOW (ref 3.87–5.11)
Retic Count, Absolute: 37.2 10*3/uL (ref 19.0–186.0)
Retic Ct Pct: 1.2 % (ref 0.4–3.1)

## 2021-10-11 LAB — SAVE SMEAR(SSMR), FOR PROVIDER SLIDE REVIEW

## 2021-10-11 LAB — FERRITIN: Ferritin: 262 ng/mL (ref 11–307)

## 2021-10-11 MED ORDER — EPOETIN ALFA-EPBX 20000 UNIT/ML IJ SOLN
20000.0000 [IU] | Freq: Once | INTRAMUSCULAR | Status: AC
  Administered 2021-10-11: 20000 [IU] via SUBCUTANEOUS
  Filled 2021-10-11: qty 1

## 2021-10-11 MED ORDER — EPOETIN ALFA-EPBX 40000 UNIT/ML IJ SOLN
40000.0000 [IU] | Freq: Once | INTRAMUSCULAR | Status: AC
  Administered 2021-10-11: 40000 [IU] via SUBCUTANEOUS
  Filled 2021-10-11: qty 1

## 2021-10-11 NOTE — Patient Instructions (Signed)
Epoetin Alfa injection °What is this medication? °EPOETIN ALFA (e POE e tin AL fa) helps your body make more red blood cells. This medicine is used to treat anemia caused by chronic kidney disease, cancer chemotherapy, or HIV-therapy. It may also be used before surgery if you have anemia. °This medicine may be used for other purposes; ask your health care provider or pharmacist if you have questions. °COMMON BRAND NAME(S): Epogen, Procrit, Retacrit °What should I tell my care team before I take this medication? °They need to know if you have any of these conditions: °cancer °heart disease °high blood pressure °history of blood clots °history of stroke °low levels of folate, iron, or vitamin B12 in the blood °seizures °an unusual or allergic reaction to erythropoietin, albumin, benzyl alcohol, hamster proteins, other medicines, foods, dyes, or preservatives °pregnant or trying to get pregnant °breast-feeding °How should I use this medication? °This medicine is for injection into a vein or under the skin. It is usually given by a health care professional in a hospital or clinic setting. °If you get this medicine at home, you will be taught how to prepare and give this medicine. Use exactly as directed. Take your medicine at regular intervals. Do not take your medicine more often than directed. °It is important that you put your used needles and syringes in a special sharps container. Do not put them in a trash can. If you do not have a sharps container, call your pharmacist or healthcare provider to get one. °A special MedGuide will be given to you by the pharmacist with each prescription and refill. Be sure to read this information carefully each time. °Talk to your pediatrician regarding the use of this medicine in children. While this drug may be prescribed for selected conditions, precautions do apply. °Overdosage: If you think you have taken too much of this medicine contact a poison control center or emergency  room at once. °NOTE: This medicine is only for you. Do not share this medicine with others. °What if I miss a dose? °If you miss a dose, take it as soon as you can. If it is almost time for your next dose, take only that dose. Do not take double or extra doses. °What may interact with this medication? °Interactions have not been studied. °This list may not describe all possible interactions. Give your health care provider a list of all the medicines, herbs, non-prescription drugs, or dietary supplements you use. Also tell them if you smoke, drink alcohol, or use illegal drugs. Some items may interact with your medicine. °What should I watch for while using this medication? °Your condition will be monitored carefully while you are receiving this medicine. °You may need blood work done while you are taking this medicine. °This medicine may cause a decrease in vitamin B6. You should make sure that you get enough vitamin B6 while you are taking this medicine. Discuss the foods you eat and the vitamins you take with your health care professional. °What side effects may I notice from receiving this medication? °Side effects that you should report to your doctor or health care professional as soon as possible: °allergic reactions like skin rash, itching or hives, swelling of the face, lips, or tongue °seizures °signs and symptoms of a blood clot such as breathing problems; changes in vision; chest pain; severe, sudden headache; pain, swelling, warmth in the leg; trouble speaking; sudden numbness or weakness of the face, arm or leg °signs and symptoms of a stroke like   changes in vision; confusion; trouble speaking or understanding; severe headaches; sudden numbness or weakness of the face, arm or leg; trouble walking; dizziness; loss of balance or coordination °Side effects that usually do not require medical attention (report to your doctor or health care professional if they continue or are  bothersome): °chills °cough °dizziness °fever °headaches °joint pain °muscle cramps °muscle pain °nausea, vomiting °pain, redness, or irritation at site where injected °This list may not describe all possible side effects. Call your doctor for medical advice about side effects. You may report side effects to FDA at 1-800-FDA-1088. °Where should I keep my medication? °Keep out of the reach of children. °Store in a refrigerator between 2 and 8 degrees C (36 and 46 degrees F). Do not freeze or shake. Throw away any unused portion if using a single-dose vial. Multi-dose vials can be kept in the refrigerator for up to 21 days after the initial dose. Throw away unused medicine. °NOTE: This sheet is a summary. It may not cover all possible information. If you have questions about this medicine, talk to your doctor, pharmacist, or health care provider. °© 2022 Elsevier/Gold Standard (2017-05-29 00:00:00) ° °

## 2021-10-12 ENCOUNTER — Other Ambulatory Visit: Payer: Self-pay

## 2021-10-12 ENCOUNTER — Ambulatory Visit: Payer: Self-pay

## 2021-10-12 ENCOUNTER — Ambulatory Visit: Payer: Medicare HMO | Admitting: Podiatry

## 2021-10-12 ENCOUNTER — Ambulatory Visit: Payer: Medicare HMO

## 2021-10-12 NOTE — Patient Outreach (Signed)
[facility]) Care Management  10/12/2021  [name] [birth year] [id number]   Telephone Assessment   Successful outreach call placed to patient. She reports she is doing fairly well. She shares that she has been having some issues with her hip. She reports that she has several ongoing medical issues that are being followed by MDs. She denies any RN CM needs or concerns at this time.   Medications Reviewed Today     Reviewed by Hayden Pedro, RN (Registered Nurse) on 10/12/21 at [facility] List Status: <None>   Medication Order Taking? Sig Documenting Provider Last Dose Status Informant  acetaminophen (TYLENOL) 325 MG tablet [id number] No Take 650 mg by mouth every 6 (six) hours as needed for mild pain.  [provider] Taking Active Self  allopurinol (ZYLOPRIM) 100 MG tablet [id number] No Take 100 mg by mouth daily. [provider] Taking Active Self  ALPRAZolam (XANAX) 0.5 MG tablet [id number] No Take 0.25 mg by mouth at bedtime. [provider] Taking Active Self           Med Note [name], [name]   Thu Sep 11, 2019 12:02 PM)    ALPRAZolam (XANAX) 0.5 MG tablet [id number] No Take 1 tablet by mouth at bedtime as needed. [provider] Taking Active   amLODipine (NORVASC) 10 MG tablet [id number] No TAKE 1 TABLET EVERY DAY [name], MD Taking Active   atorvastatin (LIPITOR) 80 MG tablet [id number] No Take 1 tablet (80 mg total) by mouth daily at 6 PM. [name], MD Taking Active Self  Blood Glucose Monitoring Suppl (TRUE METRIX METER) w/Device KIT [id number] No  [provider] Taking Active   Cholecalciferol (VITAMIN D3) 1000 UNITS CAPS [id number] No Take 2,000 Units by mouth daily with lunch.  [provider] Taking Active Self           Med Note [name]   Fri Jun 27, 2019 12:31 PM)    ciprofloxacin (CIPRO) 500 MG tablet [id number] No Take 500 mg by mouth daily as needed. [provider] Taking Active   cloNIDine (CATAPRES) 0.2 MG tablet [id number] No TAKE 1 TABLET TWICE DAILY [name], MD Taking Active   Cyanocobalamin (VITAMIN B12) 1000 MCG TBCR [id number] No Take 1,000 mcg by mouth daily with lunch. [provider] Taking Active Self  estradiol (ESTRACE) 0.1 MG/GM vaginal cream [id number]  Place 0.5g nightly for two weeks then twice a week after Jaquita Folds, MD  Active   furosemide (LASIX) 40 MG tablet [id number] No TAKE 2 TABLETS (80 MG TOTAL) IN MORNING AND 1 AND 1/2 TABLETS (60 MG TOTAL) IN EVENING. CHANGE IN DOSAGE. Clegg, Amy D, NP Taking Active   glucose blood (TRUE METRIX BLOOD GLUCOSE TEST) test strip [id number] No Use 1-2 times daily [provider] Taking Active   hydrALAZINE (APRESOLINE) 100 MG tablet [id number] No TAKE 1 TABLET THREE TIMES DAILY [name], MD Taking Active   lactose free nutrition (BOOST) LIQD [id number] No Take 237 mLs by mouth daily. (To supplement meals)  [provider] Taking Active   magnesium oxide (MAG-OX) 400 (241.3 Mg) MG tablet [id number] No Take 1 tablet (400 mg total) by mouth daily. [name], MD Taking Active Self           Med Note [name] Jul 07, 2019  3:42 AM)    metoprolol succinate (TOPROL-XL) 25 MG 24 hr tablet [id number]  No TAKE 1/2 TABLET EVERY DAY [name], MD Taking Active   pantoprazole (PROTONIX) 40 MG tablet [id number] No Take 40 mg by mouth 2 (two) times daily.  [provider] Taking Active Self           Med Note Kathyrn Drown Jun 20, 2017 10:33 AM)    spironolactone (ALDACTONE) 25 MG tablet [id number] No TAKE 1 TABLET EVERY DAY [name], MD Taking Active   tadalafil, PAH, (ADCIRCA) 20 MG tablet [id number] No Take 2 tablets (40 mg total) by mouth daily. [name], MD Taking Active   TRUE METRIX BLOOD GLUCOSE TEST test strip [id number] No SMARTSIG:Via Meter [provider] Taking Active    vitamin C (ASCORBIC ACID) 500 MG tablet [id number] No Take 500 mg by mouth daily with lunch. [provider] Taking Active Self  warfarin (COUMADIN) 5 MG tablet [id number] No TAKE 1 AND 1/2 TABLETS TO 2 TABLETS DAILY AS DIRECTED BY [facility]IN CLINIC Lorretta Harp, MD Taking Active              Goals Addressed             This Visit's Progress    Track and Manage Fluids and Swelling-Heart Failure       Barriers: Health Behaviors Knowledge Timeframe:  Long-Range Goal Priority:  High Start Date:         01/13/21                    Expected End Date:   04/07/22  Follow Up Date 11/08/2021  - call office if I gain more than 2 pounds in one day or 5 pounds in one week - keep legs up while sitting - track weight in diary - use salt in moderation    Why is this important?   It is important to check your weight daily and watch how much salt and liquids you have.  It will help you to manage your heart failure.    Notes:  01/13/21 Patient continues to weigh daily and use salt in moderation. Patient knows action plan.   04/13/21 Patient weight 129 lbs.  Discussed heart failure management and notifying physician for any changes.  Heart Failure Management Discussed Please weight daily or as ordered by your doctor Report to your doctor weight gain of 2 -3 pounds in a day or 5 pounds in a week Limit salt intake Monitor for shortness of breath, swelling of feet, ankles or abdomen and weight gain. Never use the saltshaker.  Read all food labels and avoid canned, processed, and pickled foods.   Follow your doctor's recommendations for daily salt intake 07/13/21 Daily weights continue.  Weight 126 lbs.  Discussed heart failure and importance of weights and diet.    10/12/21-Patient reports wgt 125-126lbs. She has chronic lower extremity swelling(left worse than right). She voices that she is adhering to diuretic regimen. However, she admits to increased salt intake over the recent  holidays. She has telehealth VS reporting device that sends her daily vitals to MD office. BP has been ranging in the 130-160s- MD office is aware.          Plan: RN CM discussed with patient next outreach within the month of April. Patient agrees to care plan and follow up.  Enzo Montgomery, RN,BSN,CCM [facility] Telephonic Care Management Coordinator Direct Phone: [phone] Toll Free: [phone] Fax: [phone]

## 2021-10-21 ENCOUNTER — Other Ambulatory Visit: Payer: Medicare HMO

## 2021-10-25 DIAGNOSIS — L0231 Cutaneous abscess of buttock: Secondary | ICD-10-CM | POA: Diagnosis not present

## 2021-10-25 DIAGNOSIS — N3090 Cystitis, unspecified without hematuria: Secondary | ICD-10-CM | POA: Diagnosis not present

## 2021-10-25 DIAGNOSIS — N390 Urinary tract infection, site not specified: Secondary | ICD-10-CM | POA: Diagnosis not present

## 2021-10-28 ENCOUNTER — Telehealth (HOSPITAL_COMMUNITY): Payer: Self-pay | Admitting: Pharmacist

## 2021-10-28 NOTE — Telephone Encounter (Signed)
Advanced Heart Failure Patient Advocate Encounter  Prior Authorization for tadalafil has been approved.    PA# [id number] Effective dates: 10/09/21 through 10/08/22  Audry Riles, PharmD, BCPS, BCCP, CPP Heart Failure Clinic Pharmacist [phone]

## 2021-10-31 ENCOUNTER — Other Ambulatory Visit: Payer: Self-pay

## 2021-10-31 ENCOUNTER — Ambulatory Visit (INDEPENDENT_AMBULATORY_CARE_PROVIDER_SITE_OTHER): Payer: Medicare HMO | Admitting: Podiatry

## 2021-10-31 ENCOUNTER — Ambulatory Visit: Payer: Medicare HMO

## 2021-10-31 DIAGNOSIS — E1151 Type 2 diabetes mellitus with diabetic peripheral angiopathy without gangrene: Secondary | ICD-10-CM

## 2021-10-31 DIAGNOSIS — M21621 Bunionette of right foot: Secondary | ICD-10-CM

## 2021-10-31 DIAGNOSIS — E119 Type 2 diabetes mellitus without complications: Secondary | ICD-10-CM

## 2021-10-31 DIAGNOSIS — R600 Localized edema: Secondary | ICD-10-CM | POA: Diagnosis not present

## 2021-10-31 DIAGNOSIS — M79675 Pain in left toe(s): Secondary | ICD-10-CM | POA: Diagnosis not present

## 2021-10-31 DIAGNOSIS — M79674 Pain in right toe(s): Secondary | ICD-10-CM | POA: Diagnosis not present

## 2021-10-31 DIAGNOSIS — B351 Tinea unguium: Secondary | ICD-10-CM

## 2021-10-31 DIAGNOSIS — M21622 Bunionette of left foot: Secondary | ICD-10-CM

## 2021-10-31 NOTE — Progress Notes (Signed)
SITUATION Reason for Consult: Evaluation for Prefabricated Diabetic Shoes and Bilateral Custom Diabetic Inserts. Patient / Caregiver Report: Patient would like well fitting shoes  OBJECTIVE DATA: Patient History / Diagnosis:    ICD-10-CM   1. Type II diabetes mellitus with peripheral circulatory disorder (HCC)  E11.51       Current or Previous Devices:   None and no history  In-Person Foot Examination: Ulcers & Callousing:   None  Toe / Foot Deformities:   - Pes Planus   Shoe Size: 9W  ORTHOTIC RECOMMENDATION Recommended Devices: - 1x pair prefabricated PDAC approved diabetic shoes: Apex [id number] 9W - 3x pair custom-to-patient vacuum formed diabetic insoles.   GOALS OF SHOES AND INSOLES - Reduce shear and pressure - Reduce / Prevent callus formation - Reduce / Prevent ulceration - Protect the fragile healing compromised diabetic foot.  Patient would benefit from diabetic shoes and inserts as patient has diabetes mellitus and the patient has one or more of the following conditions: - History of partial or complete amputation of the foot - History of previous foot ulceration. - History of pre-ulcerative callus - Peripheral neuropathy with evidence of callus formation - Foot deformity - Poor circulation  ACTIONS PERFORMED Patient was casted for insoles via crush box and measured for shoes via brannock device. Procedure was explained and patient tolerated procedure well. All questions were answered and concerns addressed.  PLAN Patient is to ensure treating physician receives and completes diabetic paperwork. Casts and shoe order are to be held until paperwork is received. Once received patient is to be scheduled for fitting in four weeks.

## 2021-10-31 NOTE — Progress Notes (Signed)
ANNUAL DIABETIC FOOT EXAM  Subjective: [name] presents today for for annual diabetic foot examination.  Patient relates 20 year h/o diabetes.  Patient denies any h/o foot wounds.  Patient has occasional symptoms of burning in feet.  Patient's blood sugar was 101 mg/dl this morning.   Risk factors: diabetes, PAD, foot deformity, HTN, CAD.  [name], [name], MD is patient's PCP. Last visit was 10/25/2021.  Past Medical History:  Diagnosis Date   Aortic atherosclerosis ([place]) 01/23/2018   Atrial fibrillation ([place])    Carotid artery disease ([place])    Cholelithiasis 01/23/2018   Chronic anticoagulation    Coronary artery disease    status post coronary artery bypass grafting times 07/10/2004   Diabetes mellitus    Hypercholesteremia    Hypertension    Mechanical heart valve present    H. aortic valve replacement at the time of bypass surgery October 2005   Moderate to severe pulmonary hypertension ([place])    Peripheral arterial disease ([place])    history of left common iliac artery PTA and stenting for a chronic total occlusion 08/26/01   Patient Active Problem List   Diagnosis Date Noted   Unilateral primary osteoarthritis, right hip 05/31/2021   Midline cystocele 05/30/2021   Primary localized osteoarthritis of pelvic region and thigh 05/30/2021   Vaginal pain 05/30/2021   Asymmetric SNHL (sensorineural hearing loss) 01/06/2021   Referred otalgia of both ears 01/06/2021   Bilateral hearing loss 08/31/2020   Bilateral impacted cerumen 08/31/2020   Low back pain 02/20/2020   CKD (chronic kidney disease), stage IV ([place]) 10/13/2019   Leukopenia due to antineoplastic chemotherapy ([place]) 08/18/2019   Chemotherapy induced neutropenia ([place]) 07/21/2019   Thrombophilia ([place]) 06/19/2019   Encounter for general adult medical examination without abnormal findings 06/13/2019   Arteritis ([place]) 06/06/2019   Hypertensive heart and chronic kidney disease with heart failure and stage 1  through stage 4 chronic kidney disease, or unspecified chronic kidney disease ([place]) 06/06/2019   Muscle weakness 06/06/2019   Pancytopenia ([place]) 05/28/2019   Dyspnea 05/28/2019   Vasculitis, ANCA positive 05/28/2019   Disorder of connective tissue ([place]) 05/14/2019   Renal insufficiency 05/08/2019   Acute on chronic diastolic CHF (congestive heart failure) ([place]) 04/07/2019   Left leg cellulitis 04/07/2019   PAF (paroxysmal atrial fibrillation) ([place]) 04/07/2019   Iron deficiency anemia 02/28/2019   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 ([place]) 02/28/2019   CKD (chronic kidney disease) stage 3, GFR 30-59 ml/min ([place]) 02/24/2019   Diabetic retinopathy associated with type 2 diabetes mellitus ([place]) 09/02/2018   Chronic pain 06/19/2018   Non-thrombocytopenic purpura ([place]) 05/21/2018   Gout 03/22/2018   Malnutrition of moderate degree 03/12/2018   Acute on chronic diastolic heart failure ([place]) 03/06/2018   Diabetes mellitus type 2, controlled ([place]) 03/06/2018   S/P cholecystectomy 03/06/2018   HTN (hypertension) 03/06/2018   H/O mechanical aortic valve replacement 03/06/2018   Moderate to severe pulmonary hypertension ([place]) 03/06/2018   GERD (gastroesophageal reflux disease) 02/21/2018   Anxiety 02/21/2018   Chronic diastolic CHF (congestive heart failure) ([place]) 02/21/2018   Diarrhea 02/21/2018   General weakness 02/02/2018   Thrombocytopenia ([place]) 01/25/2018   Arm pain, diffuse 01/25/2018   Anemia 01/23/2018   Cholelithiasis 01/23/2018   Aortic atherosclerosis ([place]) 01/23/2018   GI bleed 01/18/2018   Age-related osteoporosis without current pathological fracture 05/24/2017   Carotid artery occlusion 05/24/2017   Generalized anxiety disorder 05/24/2017   Hearing loss 05/24/2017   Presence of prosthetic heart  valve 05/24/2017   Coronary artery disease 08/18/2013   Carotid artery disease ([place]) 08/18/2013   Peripheral arterial disease ([place]) 08/18/2013   Diabetes  mellitus without complication ([place]) 57/84/6962   Benign positional vertigo 10/06/2011   Hyponatremia 10/06/2011   Hypertension    Hypercholesteremia    Mechanical heart valve present    Chronic anticoagulation    Past Surgical History:  Procedure Laterality Date   anterior repair  2009   AORTIC VALVE REPLACEMENT  2005   Dr. Cyndia Bent   CARDIAC CATHETERIZATION  11/10/2004   40% right common illiac, 70% in stent restenosis of distal left common illiac,    CARDIAC CATHETERIZATION  05/18/2004   LAD 50-70% midstenosis, RCA dominant w/50% stenosis, 50% Right common Illiac artery ostial stenosis, 90% in stent restenosis within midportion of left common illiac stent   Carotid Duplex  03/12/2012   RSA-elev. velocities suggestive of a 50-69% diameter reduction, Right&Left Bulb/Prox ICA-mild-mod.fibrous plaqueelevating Velocities abnormal study.   CHOLECYSTECTOMY N/A 03/01/2018   Procedure: LAPAROSCOPIC CHOLECYSTECTOMY;  Surgeon: Judeth Horn, MD;  Location: [facility];  Service: General;  Laterality: N/A;   COLONOSCOPY WITH PROPOFOL N/A 01/22/2018   Procedure: COLONOSCOPY WITH PROPOFOL;  Surgeon: Wilford Corner, MD;  Location: [facility];  Service: Endoscopy;  Laterality: N/A;   ESOPHAGOGASTRODUODENOSCOPY (EGD) WITH PROPOFOL N/A 01/22/2018   Procedure: ESOPHAGOGASTRODUODENOSCOPY (EGD) WITH PROPOFOL;  Surgeon: Wilford Corner, MD;  Location: [facility];  Service: Endoscopy;  Laterality: N/A;   ESOPHAGOGASTRODUODENOSCOPY (EGD) WITH PROPOFOL N/A 11/21/2018   Procedure: ESOPHAGOGASTRODUODENOSCOPY (EGD) WITH PROPOFOL;  Surgeon: [name], MD;  Location: [facility];  Service: Gastroenterology;  Laterality: N/A;   ESOPHAGOGASTRODUODENOSCOPY (EGD) WITH PROPOFOL Left 02/27/2019   Procedure: ESOPHAGOGASTRODUODENOSCOPY (EGD) WITH PROPOFOL;  Surgeon: Arta Silence, MD;  Location: [facility] ENDOSCOPY;  Service: Endoscopy;  Laterality: Left;   GIVENS CAPSULE STUDY N/A 11/21/2018   Procedure: GIVENS CAPSULE STUDY;   Surgeon: [name], MD;  Location: [facility];  Service: Gastroenterology;  Laterality: N/A;  To be deployed during EGD   Lower Ext. Duplex  03/12/2012   Right Proximal CIA- vessel narrowing w/elevated velocities 0-49% diameter reduction. Right SFA-mild mixed density plaque throughout vessel.   NM MYOCAR PERF WALL MOTION  05/19/2010   protocol: Persantine, post stress EF 65%, negative for ischemia, low risk scan   RIGHT HEART CATH N/A 06/27/2018   Procedure: RIGHT HEART CATH;  Surgeon: [name], MD;  Location: [facility];  Service: Cardiovascular;  Laterality: N/A;   TOTAL ABDOMINAL HYSTERECTOMY W/ BILATERAL SALPINGOOPHORECTOMY  1989   TRANSTHORACIC ECHOCARDIOGRAM  08/29/2012   Moderately calcified annulus of mitral valve, moderate regurg. of both mitral valve and tricuspid valve.    Current Outpatient Medications on File Prior to Visit  Medication Sig Dispense Refill   acetaminophen (TYLENOL) 325 MG tablet Take 650 mg by mouth every 6 (six) hours as needed for mild pain.      allopurinol (ZYLOPRIM) 100 MG tablet Take 100 mg by mouth daily.     ALPRAZolam (XANAX) 0.5 MG tablet Take 0.25 mg by mouth at bedtime.     ALPRAZolam (XANAX) 0.5 MG tablet Take 1 tablet by mouth at bedtime as needed.     amLODipine (NORVASC) 10 MG tablet TAKE 1 TABLET EVERY DAY 90 tablet 3   atorvastatin (LIPITOR) 80 MG tablet Take 1 tablet (80 mg total) by mouth daily at 6 PM.     Blood Glucose Monitoring Suppl (TRUE METRIX METER) w/Device KIT      Cholecalciferol (VITAMIN D3)  1000 UNITS CAPS Take 2,000 Units by mouth daily with lunch.      ciprofloxacin (CIPRO) 500 MG tablet Take 500 mg by mouth daily as needed.     cloNIDine (CATAPRES) 0.2 MG tablet TAKE 1 TABLET TWICE DAILY 180 tablet 3   Cyanocobalamin (VITAMIN B12) 1000 MCG TBCR Take 1,000 mcg by mouth daily with lunch.     estradiol (ESTRACE) 0.1 MG/GM vaginal cream Place 0.5g nightly for two weeks then twice a week after 30 g 11   furosemide  (LASIX) 40 MG tablet TAKE 2 TABLETS (80 MG TOTAL) IN MORNING AND 1 AND 1/2 TABLETS (60 MG TOTAL) IN EVENING. CHANGE IN DOSAGE. 315 tablet 0   glucose blood (TRUE METRIX BLOOD GLUCOSE TEST) test strip Use 1-2 times daily     hydrALAZINE (APRESOLINE) 100 MG tablet TAKE 1 TABLET THREE TIMES DAILY 270 tablet 3   lactose free nutrition (BOOST) LIQD Take 237 mLs by mouth daily. (To supplement meals)      magnesium oxide (MAG-OX) 400 (241.3 Mg) MG tablet Take 1 tablet (400 mg total) by mouth daily.     metoprolol succinate (TOPROL-XL) 25 MG 24 hr tablet TAKE 1/2 TABLET EVERY DAY 45 tablet 3   pantoprazole (PROTONIX) 40 MG tablet Take 40 mg by mouth 2 (two) times daily.      spironolactone (ALDACTONE) 25 MG tablet TAKE 1 TABLET EVERY DAY 90 tablet 3   tadalafil, PAH, (ADCIRCA) 20 MG tablet Take 2 tablets (40 mg total) by mouth daily. 60 tablet 11   TRUE METRIX BLOOD GLUCOSE TEST test strip SMARTSIG:Via Meter     vitamin C (ASCORBIC ACID) 500 MG tablet Take 500 mg by mouth daily with lunch.     warfarin (COUMADIN) 5 MG tablet TAKE 1 AND 1/2 TABLETS TO 2 TABLETS DAILY AS DIRECTED BY COUMADIN CLINIC 180 tablet 0   No current facility-administered medications on file prior to visit.    Allergies  Allergen Reactions   Flagyl [Metronidazole] Rash    ALL-OVER BODY RASH   Ciprofloxacin     Other reaction(s): Stomach upset - tolerates Other reaction(s): Stomach upset - tolerates   Coreg [Carvedilol] Other (See Comments)    Terrible cramping in the feet and had a lot of bowel movements, but not diarrhea   Losartan Swelling    Patient doesn't recall site of swelling   Sulfamethoxazole-Trimethoprim     Other reaction(s): stomach upset .   Verapamil Hives   Zetia [Ezetimibe] Other (See Comments)    Reaction not recalled   Zocor [Simvastatin - High Dose] Other (See Comments)    Reaction not recalled   Gatifloxacin Rash    Redness to skin around eye   Social History   Occupational History    Occupation: Retired  Tobacco Use   Smoking status: Former    Packs/day: 1.00    Years: 30.00    Pack years: 30.00    Types: Cigarettes   Smokeless tobacco: Never   Tobacco comments:    quit smoking 2005  Vaping Use   Vaping Use: Never used  Substance and Sexual Activity   Alcohol use: No    Alcohol/week: 0.0 standard drinks   Drug use: No   Sexual activity: Not Currently    Birth control/protection: None   Family History  Problem Relation Age of Onset   Heart disease Father    Hypertension Father    Hyperlipidemia Father    Breast cancer Neg Hx    Immunization History  Administered Date(s) Administered   Influenza-Unspecified 08/24/2019     Review of Systems: Negative except as noted in the HPI.   Objective: There were no vitals filed for this visit.  DARIYAH GARDUNO is a pleasant 81 y.o. female in NAD. AAO X 3.  Vascular Examination: CFT <3 seconds b/l LE. Faintly palpable DP pulses b/l LE. Faintly palpable PT pulse(s) b/l LE. Pedal hair absent. No pain with calf compression b/l. Lower extremity skin temperature gradient warm to cool. Patient wearing compression hose on today's visit. Trace edema noted BLE. No ischemia or gangrene noted b/l LE. No cyanosis or clubbing noted b/l LE.  Dermatological Examination: Pedal skin thin, shiny and atrophic b/l LE. No open wounds b/l LE. No interdigital macerations noted b/l LE. Toenails 1-5 b/l elongated, discolored, dystrophic, thickened, crumbly with subungual debris and tenderness to dorsal palpation. No hyperkeratotic nor porokeratotic lesions present on today's visit.  Musculoskeletal Examination: Muscle strength 5/5 to all lower extremity muscle groups bilaterally. No pain, crepitus or joint limitation noted with ROM bilateral LE. Tailor's bunion deformity noted b/l LE. Patient ambulates independent of any assistive aids.  Footwear Assessment: Does the patient wear appropriate shoes? Yes. Does the patient need  inserts/orthotics? Yes.  Neurological Examination: Protective sensation intact 5/5 intact bilaterally with 10g monofilament b/l. Vibratory sensation intact b/l.  Assessment: 1. Pain due to onychomycosis of toenails of both feet   2. Tailor's bunion of both feet   3. Edema, lower extremity   4. Type II diabetes mellitus with peripheral circulatory disorder (HCC)   5. Encounter for diabetic foot exam ([place])     ADA Risk Categorization: Low Risk :  Patient has all of the following: Intact protective sensation No prior foot ulcer  No severe deformity Pedal pulses present  Plan: -Diabetic foot examination performed today. -Continue foot and shoe inspections daily. Monitor blood glucose per PCP/Endocrinologist's recommendations. -Patient to continue soft, supportive shoe gear daily. Start procedure for diabetic shoes. Patient qualifies based on diagnoses. -Patient to schedule appointment with Pedorthist for diabetic shoe measurements. -Mycotic toenails 1-5 bilaterally were debrided in length and girth with sterile nail nippers and dremel without incident. -Patient/POA to call should there be question/concern in the interim.  Return in about 9 weeks (around 01/02/2022).  Marzetta Board, DPM

## 2021-11-06 ENCOUNTER — Encounter: Payer: Self-pay | Admitting: Podiatry

## 2021-11-06 DIAGNOSIS — I482 Chronic atrial fibrillation, unspecified: Secondary | ICD-10-CM | POA: Diagnosis not present

## 2021-11-06 DIAGNOSIS — N184 Chronic kidney disease, stage 4 (severe): Secondary | ICD-10-CM | POA: Diagnosis not present

## 2021-11-06 DIAGNOSIS — I130 Hypertensive heart and chronic kidney disease with heart failure and stage 1 through stage 4 chronic kidney disease, or unspecified chronic kidney disease: Secondary | ICD-10-CM | POA: Diagnosis not present

## 2021-11-06 DIAGNOSIS — I5032 Chronic diastolic (congestive) heart failure: Secondary | ICD-10-CM | POA: Diagnosis not present

## 2021-11-08 ENCOUNTER — Inpatient Hospital Stay: Payer: Medicare HMO

## 2021-11-08 ENCOUNTER — Other Ambulatory Visit: Payer: Self-pay

## 2021-11-08 VITALS — BP 169/43 | HR 50 | Temp 98.2°F | Resp 16

## 2021-11-08 DIAGNOSIS — I739 Peripheral vascular disease, unspecified: Secondary | ICD-10-CM

## 2021-11-08 DIAGNOSIS — N184 Chronic kidney disease, stage 4 (severe): Secondary | ICD-10-CM | POA: Diagnosis not present

## 2021-11-08 DIAGNOSIS — D696 Thrombocytopenia, unspecified: Secondary | ICD-10-CM

## 2021-11-08 DIAGNOSIS — I776 Arteritis, unspecified: Secondary | ICD-10-CM | POA: Diagnosis not present

## 2021-11-08 DIAGNOSIS — D500 Iron deficiency anemia secondary to blood loss (chronic): Secondary | ICD-10-CM

## 2021-11-08 DIAGNOSIS — D631 Anemia in chronic kidney disease: Secondary | ICD-10-CM | POA: Diagnosis not present

## 2021-11-08 DIAGNOSIS — D594 Other nonautoimmune hemolytic anemias: Secondary | ICD-10-CM

## 2021-11-08 DIAGNOSIS — D508 Other iron deficiency anemias: Secondary | ICD-10-CM

## 2021-11-08 LAB — CBC WITH DIFFERENTIAL/PLATELET
Abs Immature Granulocytes: 0.02 10*3/uL (ref 0.00–0.07)
Basophils Absolute: 0.1 10*3/uL (ref 0.0–0.1)
Basophils Relative: 1 %
Eosinophils Absolute: 0.4 10*3/uL (ref 0.0–0.5)
Eosinophils Relative: 7 %
HCT: 30.6 % — ABNORMAL LOW (ref 36.0–46.0)
Hemoglobin: 9.8 g/dL — ABNORMAL LOW (ref 12.0–15.0)
Immature Granulocytes: 0 %
Lymphocytes Relative: 22 %
Lymphs Abs: 1.3 10*3/uL (ref 0.7–4.0)
MCH: 30.3 pg (ref 26.0–34.0)
MCHC: 32 g/dL (ref 30.0–36.0)
MCV: 94.7 fL (ref 80.0–100.0)
Monocytes Absolute: 0.5 10*3/uL (ref 0.1–1.0)
Monocytes Relative: 9 %
Neutro Abs: 3.6 10*3/uL (ref 1.7–7.7)
Neutrophils Relative %: 61 %
Platelets: 151 10*3/uL (ref 150–400)
RBC: 3.23 MIL/uL — ABNORMAL LOW (ref 3.87–5.11)
RDW: 15.4 % (ref 11.5–15.5)
WBC: 5.9 10*3/uL (ref 4.0–10.5)
nRBC: 0 % (ref 0.0–0.2)

## 2021-11-08 LAB — RETICULOCYTES
Immature Retic Fract: 4.1 % (ref 2.3–15.9)
RBC.: 3.21 MIL/uL — ABNORMAL LOW (ref 3.87–5.11)
Retic Count, Absolute: 41.7 10*3/uL (ref 19.0–186.0)
Retic Ct Pct: 1.3 % (ref 0.4–3.1)

## 2021-11-08 LAB — FERRITIN: Ferritin: 290 ng/mL (ref 11–307)

## 2021-11-08 LAB — SAVE SMEAR(SSMR), FOR PROVIDER SLIDE REVIEW

## 2021-11-08 MED ORDER — EPOETIN ALFA-EPBX 20000 UNIT/ML IJ SOLN
20000.0000 [IU] | Freq: Once | INTRAMUSCULAR | Status: AC
  Administered 2021-11-08: 20000 [IU] via SUBCUTANEOUS
  Filled 2021-11-08: qty 1

## 2021-11-08 MED ORDER — EPOETIN ALFA-EPBX 40000 UNIT/ML IJ SOLN
40000.0000 [IU] | Freq: Once | INTRAMUSCULAR | Status: AC
  Administered 2021-11-08: 40000 [IU] via SUBCUTANEOUS
  Filled 2021-11-08: qty 1

## 2021-11-10 ENCOUNTER — Ambulatory Visit (INDEPENDENT_AMBULATORY_CARE_PROVIDER_SITE_OTHER): Payer: Medicare HMO | Admitting: *Deleted

## 2021-11-10 ENCOUNTER — Other Ambulatory Visit: Payer: Self-pay

## 2021-11-10 DIAGNOSIS — Z7901 Long term (current) use of anticoagulants: Secondary | ICD-10-CM | POA: Diagnosis not present

## 2021-11-10 DIAGNOSIS — Z952 Presence of prosthetic heart valve: Secondary | ICD-10-CM

## 2021-11-10 LAB — POCT INR: INR: 2.6 (ref 2.0–3.0)

## 2021-11-10 NOTE — Patient Instructions (Signed)
Description   Continue taking Warfarin 1.5 tablets (7.5mg ) daily except 1 tablet on Mondays, Wednesdays, and Fridays. Please keep green leafy veggies and boost in your diet.  Recheck INR in 4 weeks. Call with any new meds [phone] & if you have any procedures where you need to hold warfarin, have them fax a clearance form to [phone].

## 2021-11-11 DIAGNOSIS — L0231 Cutaneous abscess of buttock: Secondary | ICD-10-CM | POA: Diagnosis not present

## 2021-12-06 ENCOUNTER — Other Ambulatory Visit: Payer: Medicare HMO

## 2021-12-06 ENCOUNTER — Ambulatory Visit: Payer: Medicare HMO

## 2021-12-06 ENCOUNTER — Other Ambulatory Visit (HOSPITAL_COMMUNITY): Payer: Self-pay | Admitting: *Deleted

## 2021-12-06 ENCOUNTER — Other Ambulatory Visit: Payer: Self-pay | Admitting: Cardiovascular Disease

## 2021-12-06 ENCOUNTER — Ambulatory Visit: Payer: Medicare HMO | Admitting: Hematology and Oncology

## 2021-12-06 DIAGNOSIS — N184 Chronic kidney disease, stage 4 (severe): Secondary | ICD-10-CM | POA: Diagnosis not present

## 2021-12-06 DIAGNOSIS — I5032 Chronic diastolic (congestive) heart failure: Secondary | ICD-10-CM | POA: Diagnosis not present

## 2021-12-06 DIAGNOSIS — I482 Chronic atrial fibrillation, unspecified: Secondary | ICD-10-CM | POA: Diagnosis not present

## 2021-12-06 DIAGNOSIS — I130 Hypertensive heart and chronic kidney disease with heart failure and stage 1 through stage 4 chronic kidney disease, or unspecified chronic kidney disease: Secondary | ICD-10-CM | POA: Diagnosis not present

## 2021-12-06 MED ORDER — TADALAFIL (PAH) 20 MG PO TABS: 40.0000 mg | ORAL_TABLET | Freq: Every day | ORAL | 11 refills | Status: DC

## 2021-12-06 NOTE — Telephone Encounter (Signed)
Prescription refill request received for warfarin Lov: [name], 07/08/2021 Next INR check: 3/2 Warfarin tablet strength: 5mg 

## 2021-12-07 ENCOUNTER — Inpatient Hospital Stay: Payer: Medicare HMO | Admitting: Hematology and Oncology

## 2021-12-07 ENCOUNTER — Ambulatory Visit: Payer: Medicare HMO

## 2021-12-07 ENCOUNTER — Inpatient Hospital Stay: Payer: Medicare HMO

## 2021-12-07 ENCOUNTER — Ambulatory Visit: Payer: Medicare HMO | Admitting: Hematology and Oncology

## 2021-12-07 ENCOUNTER — Telehealth: Payer: Self-pay | Admitting: Adult Health

## 2021-12-07 ENCOUNTER — Other Ambulatory Visit: Payer: Medicare HMO

## 2021-12-07 ENCOUNTER — Encounter: Payer: Self-pay | Admitting: Hematology and Oncology

## 2021-12-07 NOTE — Telephone Encounter (Signed)
.  Called patient to schedule appointment per 2/28 inbasket, patient is aware of date and time.  ? ?

## 2021-12-08 ENCOUNTER — Ambulatory Visit (INDEPENDENT_AMBULATORY_CARE_PROVIDER_SITE_OTHER): Payer: Medicare HMO

## 2021-12-08 ENCOUNTER — Other Ambulatory Visit: Payer: Self-pay

## 2021-12-08 DIAGNOSIS — Z7901 Long term (current) use of anticoagulants: Secondary | ICD-10-CM | POA: Diagnosis not present

## 2021-12-08 DIAGNOSIS — Z952 Presence of prosthetic heart valve: Secondary | ICD-10-CM

## 2021-12-08 LAB — POCT INR: INR: 3.1 — AB (ref 2.0–3.0)

## 2021-12-08 NOTE — Patient Instructions (Signed)
Description   ?Only take 1 tablet today and then continue taking Warfarin 1.5 tablets (7.5mg ) daily except 1 tablet on Mondays, Wednesdays, and Fridays.  ?Please keep green leafy veggies and boost in your diet.  ?Recheck INR in 4 weeks (Pt pt request). ? Call with any new meds [phone] & if you have any procedures where you need to hold warfarin, have them fax a clearance form to [phone]. ? ? ?  ?   ?

## 2021-12-09 ENCOUNTER — Ambulatory Visit (INDEPENDENT_AMBULATORY_CARE_PROVIDER_SITE_OTHER): Payer: Medicare HMO

## 2021-12-09 DIAGNOSIS — M21621 Bunionette of right foot: Secondary | ICD-10-CM | POA: Diagnosis not present

## 2021-12-09 DIAGNOSIS — M21622 Bunionette of left foot: Secondary | ICD-10-CM

## 2021-12-09 DIAGNOSIS — E1151 Type 2 diabetes mellitus with diabetic peripheral angiopathy without gangrene: Secondary | ICD-10-CM | POA: Diagnosis not present

## 2021-12-09 NOTE — Progress Notes (Signed)
SITUATION ?Reason for Visit: Fitting of Diabetic [place] ?Patient / Caregiver Report:  Patient is satisfied with fit and function of shoes and insoles. ? ?OBJECTIVE DATA: ?Patient History / Diagnosis:   ?  ICD-10-CM   ?1. Type II diabetes mellitus with peripheral circulatory disorder (HCC)  E11.51   ?  ?2. Tailor's bunion of both feet  M21.621   ? X21.194   ?  ? ? ?Change in Status:   None ? ?ACTIONS PERFORMED: ?In-Person Delivery, patient was fit with: ?- 1x pair [id number] PDAC approved prefabricated Diabetic Shoes: Apex [id number] ?- 3x pair [id number] PDAC approved vacuum formed custom diabetic insoles; [place]: [id number] ? ?Shoes and insoles were verified for structural integrity and safety. Patient wore shoes and insoles in office. Skin was inspected and free of areas of concern after wearing shoes and inserts. Shoes and inserts fit properly. Patient / Caregiver provided with ferbal instruction and demonstration regarding donning, doffing, wear, care, proper fit, function, purpose, cleaning, and use of shoes and insoles ' and in all related precautions and risks and benefits regarding shoes and insoles. Patient / Caregiver was instructed to wear properly fitting socks with shoes at all times. Patient was also provided with verbal instruction regarding how to report any failures or malfunctions of shoes or inserts, and necessary follow up care. Patient / Caregiver was also instructed to contact physician regarding change in status that may affect function of shoes and inserts.  ? ?Patient / Caregiver verbalized undersatnding of instruction provided. Patient / Caregiver demonstrated independence with proper donning and doffing of shoes and inserts. ? ?PLAN ?Patient to follow up as needed. Plan of care was discussed with and agreed upon by patient and/or caregiver. All questions were answered and concerns addressed. ? ?

## 2021-12-13 ENCOUNTER — Ambulatory Visit: Payer: Medicare HMO

## 2021-12-13 ENCOUNTER — Inpatient Hospital Stay: Payer: Medicare HMO

## 2021-12-13 ENCOUNTER — Other Ambulatory Visit: Payer: Self-pay

## 2021-12-13 ENCOUNTER — Encounter: Payer: Self-pay | Admitting: Hematology and Oncology

## 2021-12-13 ENCOUNTER — Encounter: Payer: Self-pay | Admitting: Adult Health

## 2021-12-13 ENCOUNTER — Inpatient Hospital Stay: Payer: Medicare HMO | Attending: Oncology | Admitting: Adult Health

## 2021-12-13 ENCOUNTER — Other Ambulatory Visit: Payer: Medicare HMO

## 2021-12-13 VITALS — BP 151/35 | HR 53 | Temp 97.7°F | Resp 16 | Ht 59.0 in | Wt 127.7 lb

## 2021-12-13 VITALS — BP 148/48 | HR 62 | Temp 97.6°F | Resp 16

## 2021-12-13 DIAGNOSIS — Z5112 Encounter for antineoplastic immunotherapy: Secondary | ICD-10-CM | POA: Insufficient documentation

## 2021-12-13 DIAGNOSIS — D61818 Other pancytopenia: Secondary | ICD-10-CM

## 2021-12-13 DIAGNOSIS — N184 Chronic kidney disease, stage 4 (severe): Secondary | ICD-10-CM | POA: Diagnosis not present

## 2021-12-13 DIAGNOSIS — I7782 Antineutrophilic cytoplasmic antibody (ANCA) vasculitis: Secondary | ICD-10-CM

## 2021-12-13 DIAGNOSIS — D594 Other nonautoimmune hemolytic anemias: Secondary | ICD-10-CM

## 2021-12-13 DIAGNOSIS — M8580 Other specified disorders of bone density and structure, unspecified site: Secondary | ICD-10-CM | POA: Insufficient documentation

## 2021-12-13 DIAGNOSIS — D696 Thrombocytopenia, unspecified: Secondary | ICD-10-CM

## 2021-12-13 DIAGNOSIS — D500 Iron deficiency anemia secondary to blood loss (chronic): Secondary | ICD-10-CM

## 2021-12-13 DIAGNOSIS — D631 Anemia in chronic kidney disease: Secondary | ICD-10-CM | POA: Insufficient documentation

## 2021-12-13 DIAGNOSIS — I739 Peripheral vascular disease, unspecified: Secondary | ICD-10-CM

## 2021-12-13 DIAGNOSIS — D508 Other iron deficiency anemias: Secondary | ICD-10-CM

## 2021-12-13 LAB — CMP (CANCER CENTER ONLY)
ALT: 11 U/L (ref 0–44)
AST: 18 U/L (ref 15–41)
Albumin: 4.1 g/dL (ref 3.5–5.0)
Alkaline Phosphatase: 79 U/L (ref 38–126)
Anion gap: 10 (ref 5–15)
BUN: 107 mg/dL — ABNORMAL HIGH (ref 8–23)
CO2: 19 mmol/L — ABNORMAL LOW (ref 22–32)
Calcium: 9.1 mg/dL (ref 8.9–10.3)
Chloride: 104 mmol/L (ref 98–111)
Creatinine: 2.78 mg/dL — ABNORMAL HIGH (ref 0.44–1.00)
GFR, Estimated: 16 mL/min — ABNORMAL LOW (ref >60)
Glucose, Bld: 152 mg/dL — ABNORMAL HIGH (ref 70–99)
Potassium: 4.7 mmol/L (ref 3.5–5.1)
Sodium: 133 mmol/L — ABNORMAL LOW (ref 135–145)
Total Bilirubin: 0.3 mg/dL (ref 0.3–1.2)
Total Protein: 6.4 g/dL — ABNORMAL LOW (ref 6.5–8.1)

## 2021-12-13 LAB — CBC WITH DIFFERENTIAL/PLATELET
Abs Immature Granulocytes: 0.04 10*3/uL (ref 0.00–0.07)
Basophils Absolute: 0.1 10*3/uL (ref 0.0–0.1)
Basophils Relative: 1 %
Eosinophils Absolute: 0.3 10*3/uL (ref 0.0–0.5)
Eosinophils Relative: 4 %
HCT: 19 % — ABNORMAL LOW (ref 36.0–46.0)
Hemoglobin: 6.2 g/dL — CL (ref 12.0–15.0)
Immature Granulocytes: 1 %
Lymphocytes Relative: 15 %
Lymphs Abs: 1 10*3/uL (ref 0.7–4.0)
MCH: 31.2 pg (ref 26.0–34.0)
MCHC: 32.6 g/dL (ref 30.0–36.0)
MCV: 95.5 fL (ref 80.0–100.0)
Monocytes Absolute: 0.6 10*3/uL (ref 0.1–1.0)
Monocytes Relative: 9 %
Neutro Abs: 4.4 10*3/uL (ref 1.7–7.7)
Neutrophils Relative %: 70 %
Platelets: 147 10*3/uL — ABNORMAL LOW (ref 150–400)
RBC: 1.99 MIL/uL — ABNORMAL LOW (ref 3.87–5.11)
RDW: 16 % — ABNORMAL HIGH (ref 11.5–15.5)
WBC: 6.3 10*3/uL (ref 4.0–10.5)
nRBC: 0 % (ref 0.0–0.2)

## 2021-12-13 LAB — DIRECT ANTIGLOBULIN TEST (NOT AT ARMC)
DAT, IgG: NEGATIVE
DAT, complement: NEGATIVE

## 2021-12-13 LAB — RETICULOCYTES
Immature Retic Fract: 16.6 % — ABNORMAL HIGH (ref 2.3–15.9)
RBC.: 1.98 MIL/uL — ABNORMAL LOW (ref 3.87–5.11)
Retic Count, Absolute: 78.8 10*3/uL (ref 19.0–186.0)
Retic Ct Pct: 4 % — ABNORMAL HIGH (ref 0.4–3.1)

## 2021-12-13 LAB — FERRITIN: Ferritin: 170 ng/mL (ref 11–307)

## 2021-12-13 LAB — SAVE SMEAR(SSMR), FOR PROVIDER SLIDE REVIEW

## 2021-12-13 MED ORDER — SODIUM CHLORIDE 0.9 % IV SOLN
375.0000 mg/m2 | Freq: Once | INTRAVENOUS | Status: AC
  Administered 2021-12-13: 600 mg via INTRAVENOUS
  Filled 2021-12-13: qty 50

## 2021-12-13 MED ORDER — EPOETIN ALFA-EPBX 40000 UNIT/ML IJ SOLN
40000.0000 [IU] | Freq: Once | INTRAMUSCULAR | Status: AC
  Administered 2021-12-13: 40000 [IU] via SUBCUTANEOUS
  Filled 2021-12-13: qty 1

## 2021-12-13 MED ORDER — EPOETIN ALFA-EPBX 20000 UNIT/ML IJ SOLN
20000.0000 [IU] | Freq: Once | INTRAMUSCULAR | Status: AC
  Administered 2021-12-13: 20000 [IU] via SUBCUTANEOUS
  Filled 2021-12-13: qty 1

## 2021-12-13 MED ORDER — PREDNISONE 20 MG PO TABS: 40.0000 mg | ORAL_TABLET | Freq: Every day | ORAL | 0 refills | Status: DC

## 2021-12-13 MED ORDER — SODIUM CHLORIDE 0.9 % IV SOLN: Freq: Once | INTRAVENOUS | Status: AC

## 2021-12-13 MED ORDER — DIPHENHYDRAMINE HCL 25 MG PO CAPS
25.0000 mg | ORAL_CAPSULE | Freq: Once | ORAL | Status: AC
  Administered 2021-12-13: 25 mg via ORAL
  Filled 2021-12-13: qty 1

## 2021-12-13 MED ORDER — ACETAMINOPHEN 325 MG PO TABS
650.0000 mg | ORAL_TABLET | Freq: Once | ORAL | Status: AC
  Administered 2021-12-13: 650 mg via ORAL
  Filled 2021-12-13: qty 2

## 2021-12-13 NOTE — Progress Notes (Signed)
per Dr. Lindi Adie, ok to treat with Rituxan at HGB of 6.2 and no transfusion. Lexine Baton, RN aware. ?

## 2021-12-13 NOTE — Progress Notes (Unsigned)
CRITICAL VALUE STICKER ? ?CRITICAL VALUE: hgb 6.2  ? ?RECEIVER (on-site recipient of call): Sherron Ales, LPN ? ?DATE & TIME NOTIFIED: 12/13/21 1022 ? ?MESSENGER (representative from lab): Pam ? ?MD NOTIFIED: Wilber Bihari, NP  ? ?RESPONSE:   ?

## 2021-12-13 NOTE — Progress Notes (Signed)
[facility] Follow up:    [name] [name] Him, MD [place] [address]   DIAGNOSIS:   SUMMARY OF HEMATOLOGIC HISTORY: 81 y.o. [name] [name] woman with multifactorial anemia, as follows:             (a) anemia of chronic inflammation: As of May 2020 she has a SED rate of 95, CRP 1.1, positive ANA and RF; subsequently she was found to be ANCA positive             (b) hemolysis (mild): she has a mildly elevated LDH and a DAT positive for complement, not IgG; this is c/w (a) above             (c) anemia of renal insufficiency: inadequate EPO resonse to anemia                         (d) GIB/ iron deficiency in patient on lifelong anticoagulaton   (1) Feraheme received 03/01/2019, repeated 03/20/2019              (a) reticulocyte 03/07/2019 up from 43.4 a year ago to 191.8 after iron infusion             (b) feraheme repeated on 05/12/2019             (c) subsequent ferritin levels >100   (2) darbepoetin: starting 05/05/2019 at 181mg given every 2 weeks             (a) dose increased to 200 mcg every week starting 07/01/2019              (b) back to every 2 weeks beginning 08/04/2019 still at 200 mcg dose             (c) switched to Retacrit every 4 weeks as of 08/18/2019   (3) ANCA positive vasculitis: diagnosed 05/08/2019 (titer 1:640)             (a) prednisone 40 mg daily started 05/31/2019             (b) rituximab weekly started 06/12/2019 (received test dose 06/05/2019)                         (i) hepatitis B sAg, core Ab and HIV negative 05/08/2019                         (ii) rituximab held after 07/14/2019 dose (received 5 weekly doses) with neutropenia                         (iii) rituximab resumed 08/18/2019 but changed to monthly                         (iv) rituximab changed to every 2 months beginning with 02/02/2020 dose                         (v) rituximab changed to every 12 weeks after the 10/12/2020 dose             (c) prednisone  tapered to off as of 09/15/2019   (4) osteopenia with a T score of -1.5 on bone density 02/28/2016 at the [facility] center  CURRENT THERAPY: Rituximab  INTERVAL HISTORY: [name] BORMAN89y.o. female returns for her follow-up of anemia.  She receives rituximab every 12 weeks for her ANCA vasculitis, along with Retacrit every 4 weeks due to her chronic renal insufficiency.  Her hemoglobin today is 6.2.  She was surprised to hear that her hemoglobin was at this level.  She notes that in retrospect she may have some mild shortness of breath on exertion but was expecting her hemoglobin to return be in the nines as it has been for the past couple of years.  She denies any headaches or lightheadedness.   Patient Active Problem List   Diagnosis Date Noted   Unilateral primary osteoarthritis, right hip 05/31/2021   Midline cystocele 05/30/2021   Primary localized osteoarthritis of pelvic region and thigh 05/30/2021   Vaginal pain 05/30/2021   Asymmetric SNHL (sensorineural hearing loss) 01/06/2021   Referred otalgia of both ears 01/06/2021   Bilateral hearing loss 08/31/2020   Bilateral impacted cerumen 08/31/2020   Low back pain 02/20/2020   CKD (chronic kidney disease), stage IV ([place]) 10/13/2019   Leukopenia due to antineoplastic chemotherapy ([place]) 08/18/2019   Chemotherapy induced neutropenia ([place]) 07/21/2019   Thrombophilia ([place]) 06/19/2019   Encounter for general adult medical examination without abnormal findings 06/13/2019   Arteritis ([place]) 06/06/2019   Hypertensive heart and chronic kidney disease with heart failure and stage 1 through stage 4 chronic kidney disease, or unspecified chronic kidney disease ([place]) 06/06/2019   Muscle weakness 06/06/2019   Pancytopenia ([place]) 05/28/2019   Dyspnea 05/28/2019   Vasculitis, ANCA positive 05/28/2019   Disorder of connective tissue ([place]) 05/14/2019   Renal insufficiency 05/08/2019   Acute on chronic diastolic CHF (congestive heart failure)  ([place]) 04/07/2019   Left leg cellulitis 04/07/2019   PAF (paroxysmal atrial fibrillation) ([place]) 04/07/2019   Iron deficiency anemia 02/28/2019   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 ([place]) 02/28/2019   CKD (chronic kidney disease) stage 3, GFR 30-59 ml/min ([place]) 02/24/2019   Diabetic retinopathy associated with type 2 diabetes mellitus ([place]) 09/02/2018   Chronic pain 06/19/2018   Non-thrombocytopenic purpura ([place]) 05/21/2018   Gout 03/22/2018   Malnutrition of moderate degree 03/12/2018   Acute on chronic diastolic heart failure ([place]) 03/06/2018   Diabetes mellitus type 2, controlled ([place]) 03/06/2018   S/P cholecystectomy 03/06/2018   HTN (hypertension) 03/06/2018   H/O mechanical aortic valve replacement 03/06/2018   Moderate to severe pulmonary hypertension ([place]) 03/06/2018   GERD (gastroesophageal reflux disease) 02/21/2018   Anxiety 02/21/2018   Chronic diastolic CHF (congestive heart failure) ([place]) 02/21/2018   Diarrhea 02/21/2018   General weakness 02/02/2018   Thrombocytopenia ([place]) 01/25/2018   Arm pain, diffuse 01/25/2018   Anemia 01/23/2018   Cholelithiasis 01/23/2018   Aortic atherosclerosis ([place]) 01/23/2018   GI bleed 01/18/2018   Age-related osteoporosis without current pathological fracture 05/24/2017   Carotid artery occlusion 05/24/2017   Generalized anxiety disorder 05/24/2017   Hearing loss 05/24/2017   Presence of prosthetic heart valve 05/24/2017   Coronary artery disease 08/18/2013   Carotid artery disease ([place]) 08/18/2013   Peripheral arterial disease ([place]) 08/18/2013   Diabetes mellitus without complication ([place]) 97/11/6376   Benign positional vertigo 10/06/2011   Hyponatremia 10/06/2011   Hypertension    Hypercholesteremia    Mechanical heart valve present    Chronic anticoagulation     is allergic to flagyl [metronidazole], ciprofloxacin, coreg [carvedilol], losartan, sulfamethoxazole-trimethoprim, verapamil, zetia  [ezetimibe], zocor [simvastatin - high dose], and gatifloxacin.  MEDICAL HISTORY: Past Medical History:  Diagnosis Date   Aortic atherosclerosis ([place]) 01/23/2018   Atrial fibrillation ([place])  Carotid artery disease ([place])    Cholelithiasis 01/23/2018   Chronic anticoagulation    Coronary artery disease    status post coronary artery bypass grafting times 07/10/2004   Diabetes mellitus    Hypercholesteremia    Hypertension    Mechanical heart valve present    H. aortic valve replacement at the time of bypass surgery October 2005   Moderate to severe pulmonary hypertension ([place])    Peripheral arterial disease ([place])    history of left common iliac artery PTA and stenting for a chronic total occlusion 08/26/01    SURGICAL HISTORY: Past Surgical History:  Procedure Laterality Date   anterior repair  2009   AORTIC VALVE REPLACEMENT  2005   Dr. Cyndia Bent   CARDIAC CATHETERIZATION  11/10/2004   40% right common illiac, 70% in stent restenosis of distal left common illiac,    CARDIAC CATHETERIZATION  05/18/2004   LAD 50-70% midstenosis, RCA dominant w/50% stenosis, 50% Right common Illiac artery ostial stenosis, 90% in stent restenosis within midportion of left common illiac stent   Carotid Duplex  03/12/2012   RSA-elev. velocities suggestive of a 50-69% diameter reduction, Right&Left Bulb/Prox ICA-mild-mod.fibrous plaqueelevating Velocities abnormal study.   CHOLECYSTECTOMY N/A 03/01/2018   Procedure: LAPAROSCOPIC CHOLECYSTECTOMY;  Surgeon: Judeth Horn, MD;  Location: [facility];  Service: General;  Laterality: N/A;   COLONOSCOPY WITH PROPOFOL N/A 01/22/2018   Procedure: COLONOSCOPY WITH PROPOFOL;  Surgeon: Wilford Corner, MD;  Location: [facility];  Service: Endoscopy;  Laterality: N/A;   ESOPHAGOGASTRODUODENOSCOPY (EGD) WITH PROPOFOL N/A 01/22/2018   Procedure: ESOPHAGOGASTRODUODENOSCOPY (EGD) WITH PROPOFOL;  Surgeon: Wilford Corner, MD;  Location: [facility];  Service: Endoscopy;   Laterality: N/A;   ESOPHAGOGASTRODUODENOSCOPY (EGD) WITH PROPOFOL N/A 11/21/2018   Procedure: ESOPHAGOGASTRODUODENOSCOPY (EGD) WITH PROPOFOL;  Surgeon: [name], MD;  Location: [facility];  Service: Gastroenterology;  Laterality: N/A;   ESOPHAGOGASTRODUODENOSCOPY (EGD) WITH PROPOFOL Left 02/27/2019   Procedure: ESOPHAGOGASTRODUODENOSCOPY (EGD) WITH PROPOFOL;  Surgeon: Arta Silence, MD;  Location[facility] END[facility]COPY;  Service: Endoscopy;  Laterality: Left;   GIVENS CAPSULE STUDY N/A 11/21/2018   Procedure: GIVENS CAPSULE STUDY;  Surgeon: [name], MD;  Location: [facility];  Service: Gastroenterology;  Laterality: N/A;  To be deployed during EGD   Lower Ext. Duplex  03/12/2012   Right Proximal CIA- vessel narrowing w/elevated velocities 0-49% diameter reduction. Right SFA-mild mixed density plaque throughout vessel.   NM MYOCAR PERF WALL MOTION  05/19/2010   protocol: Persantine, post stress EF 65%, negative for ischemia, low risk scan   RIGHT HEART CATH N/A 06/27/2018   Procedure: RIGHT HEART CATH;  Surgeon: [name], MD;  Location: [facility] CV LAB;  Service: Cardiovascular;  Laterality: N/A;   TOTAL ABDOMINAL HYSTERECTOMY W/ BILATERAL SALPINGOOPHORECTOMY  1989   TRANSTHORACIC ECHOCARDIOGRAM  08/29/2012   Moderately calcified annulus of mitral valve, moderate regurg. of both mitral valve and tricuspid valve.     SOCIAL HISTORY: Social History   Socioeconomic History   Marital status: Widowed    Spouse name: Not on file   Number of children: Not on file   Years of education: Not on file   Highest education level: Not on file  Occupational History   Occupation: Retired  Tobacco Use   Smoking status: Former    Packs/day: 1.00    Years: 30.00    Pack years: 30.00    Types: Cigarettes   Smokeless tobacco: Never   Tobacco comments:    quit smoking 2005  Vaping Use  Vaping Use: Never used  Substance and Sexual Activity   Alcohol use: No    Alcohol/week: 0.0 standard  drinks   Drug use: No   Sexual activity: Not Currently    Birth control/protection: None  Other Topics Concern   Not on file  Social History Narrative   Not on file   Social Determinants of Health   Financial Resource Strain: Not on file  Food Insecurity: Not on file  Transportation Needs: No Transportation Needs   Lack of Transportation (Medical): No   Lack of Transportation (Non-Medical): No  Physical Activity: Not on file  Stress: Not on file  Social Connections: Not on file  Intimate Partner Violence: Not on file    FAMILY HISTORY: Family History  Problem Relation Age of Onset   Heart disease Father    Hypertension Father    Hyperlipidemia Father    Breast cancer Neg Hx     Review of Systems  Constitutional:  Positive for fatigue. Negative for appetite change, chills, fever and unexpected weight change.  HENT:   Negative for hearing loss, lump/mass and trouble swallowing.   Eyes:  Negative for eye problems and icterus.  Respiratory:  Negative for chest tightness, cough and shortness of breath.   Cardiovascular:  Negative for chest pain, leg swelling and palpitations.  Gastrointestinal:  Negative for abdominal distention, abdominal pain, constipation, diarrhea, nausea and vomiting.  Endocrine: Negative for hot flashes.  Genitourinary:  Negative for difficulty urinating.   Musculoskeletal:  Negative for arthralgias.  Skin:  Negative for itching and rash.  Neurological:  Negative for dizziness, extremity weakness, headaches and numbness.  Hematological:  Negative for adenopathy. Does not bruise/bleed easily.  Psychiatric/Behavioral:  Negative for depression. The patient is not nervous/anxious.      PHYSICAL EXAMINATION  ECOG PERFORMANCE STATUS: 1 - Symptomatic but completely ambulatory  Vitals:   12/13/21 1032  BP: (!) 151/35  Pulse: (!) 53  Resp: 16  Temp: 97.7 F (36.5 C)  SpO2: 99%    Physical Exam Constitutional:      General: She is not in acute  distress.    Appearance: Normal appearance. She is not toxic-appearing.  HENT:     Head: Normocephalic and atraumatic.  Eyes:     General: No scleral icterus. Cardiovascular:     Rate and Rhythm: Normal rate and regular rhythm.     Pulses: Normal pulses.     Heart sounds: Normal heart sounds.  Pulmonary:     Effort: Pulmonary effort is normal.     Breath sounds: Normal breath sounds.  Abdominal:     General: Abdomen is flat. Bowel sounds are normal. There is no distension.     Palpations: Abdomen is soft.     Tenderness: There is no abdominal tenderness.  Musculoskeletal:        General: No swelling.     Cervical back: Neck supple.  Lymphadenopathy:     Cervical: No cervical adenopathy.  Skin:    General: Skin is warm and dry.     Findings: No rash.  Neurological:     General: No focal deficit present.     Mental Status: She is alert.  Psychiatric:        Mood and Affect: Mood normal.        Behavior: Behavior normal.    LABORATORY DATA:  CBC    Component Value Date/Time   WBC 6.3 12/13/2021 1002   RBC 1.98 (L) 12/13/2021 1002   RBC 1.99 (L) 12/13/2021  1002   HGB 6.2 (LL) 12/13/2021 1002   HGB 9.2 (L) 05/24/2021 1414   HCT 19.0 (L) 12/13/2021 1002   HCT 28.5 (L) 11/20/2018 1824   PLT 147 (L) 12/13/2021 1002   PLT 139 (L) 05/24/2021 1414   MCV 95.5 12/13/2021 1002   MCH 31.2 12/13/2021 1002   MCHC 32.6 12/13/2021 1002   RDW 16.0 (H) 12/13/2021 1002   LYMPHSABS 1.0 12/13/2021 1002   MONOABS 0.6 12/13/2021 1002   EOSABS 0.3 12/13/2021 1002   BASOSABS 0.1 12/13/2021 1002    CMP     Component Value Date/Time   NA 134 (L) 05/24/2021 1414   NA 137 12/06/2017 1436   K 4.4 05/24/2021 1414   CL 104 05/24/2021 1414   CO2 20 (L) 05/24/2021 1414   GLUCOSE 195 (H) 05/24/2021 1414   BUN 84 (H) 05/24/2021 1414   BUN 20 12/06/2017 1436   CREATININE 2.50 (H) 05/24/2021 1414   CALCIUM 9.2 05/24/2021 1414   PROT 6.4 (L) 05/24/2021 1414   ALBUMIN 3.7 05/24/2021 1414    AST 20 05/24/2021 1414   ALT 14 05/24/2021 1414   ALKPHOS 91 05/24/2021 1414   BILITOT 0.4 05/24/2021 1414   GFRNONAA 19 (L) 05/24/2021 1414   GFRAA 32 (L) 03/30/2020 0932    ASSESSMENT and THERAPY PLAN:   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 (HCC) [name] [name] is an 81 year old [place] woman with multifactorial anemia.  1.  Anemia of chronic inflammation.  She has been ANCA positive and has experienced hemolysis to this in the past.  She will receive rituximab today.  Due to the concern for her hemolyzing she will not receive a blood transfusion today for hemoglobin of 6.2 and instead will receive rituximab.  She will also start on prednisone 40 mg daily and we will see her back weekly for rituximab and to taper her prednisone 2.  Anemia of renal insufficiency she is getting Retacrit every 4 weeks and tolerates this well. 3.  Anemia of iron deficiency: She has required IV iron in the past.  Her ferritin today is 170.  She denies any recent blood in her stool or black tarry stool.  Blaine and I talked about the above in detail.  This was discussed and the plan was formulated with Dr. Lindi Adie.  She and I reviewed reasons to call or go to the emergency room for symptomatic anemia.  We will see [name] [name] back next week for labs, follow-up, and now weekly rituximab.   All questions were answered. The patient knows to call the clinic with any problems, questions or concerns. We can certainly see the patient much sooner if necessary.  Total encounter time: 40 minutes in face-to-face visit time, chart review, lab review, care coordination, discussion with other providers, discussion with nursing, patient education, and documentation of the encounter.  Wilber Bihari, NP 12/13/21 12:32 PM Medical Oncology and Hematology [facility] [place], [place] [address] Tel. [phone]    Fax. [phone]  *Total Encounter Time as defined by  the Centers for Medicare and Medicaid Services includes, in addition to the face-to-face time of a patient visit (documented in the note above) non-face-to-face time: obtaining and reviewing outside history, ordering and reviewing medications, tests or procedures, care coordination (communications with other health care professionals or caregivers) and documentation in the medical record.

## 2021-12-13 NOTE — Patient Instructions (Signed)
[name]  ? Discharge Instructions: ?Thank you for choosing [facility] to provide your oncology and hematology care.  ? ?If you have a lab appointment with the [facility], please go directly to the [facility] and check in at the registration area. ?  ?Wear comfortable clothing and clothing appropriate for easy access to any Portacath or PICC line.  ? ?We strive to give you quality time with your provider. You may need to reschedule your appointment if you arrive late (15 or more minutes).  Arriving late affects you and other patients whose appointments are after yours.  Also, if you miss three or more appointments without notifying the office, you may be dismissed from the clinic at the provider?s discretion.    ?  ?For prescription refill requests, have your pharmacy contact our office and allow 72 hours for refills to be completed.   ? ?Today you received the following chemotherapy and/or immunotherapy agents: rituximab.    ?  ?To help prevent nausea and vomiting after your treatment, we encourage you to take your nausea medication as directed. ? ?BELOW ARE SYMPTOMS THAT SHOULD BE REPORTED IMMEDIATELY: ?*FEVER GREATER THAN 100.4 F (38 ?C) OR HIGHER ?*CHILLS OR SWEATING ?*NAUSEA AND VOMITING THAT IS NOT CONTROLLED WITH YOUR NAUSEA MEDICATION ?*UNUSUAL SHORTNESS OF BREATH ?*UNUSUAL BRUISING OR BLEEDING ?*URINARY PROBLEMS (pain or burning when urinating, or frequent urination) ?*BOWEL PROBLEMS (unusual diarrhea, constipation, pain near the anus) ?TENDERNESS IN MOUTH AND THROAT WITH OR WITHOUT PRESENCE OF ULCERS (sore throat, sores in mouth, or a toothache) ?UNUSUAL RASH, SWELLING OR PAIN  ?UNUSUAL VAGINAL DISCHARGE OR ITCHING  ? ?Items with * indicate a potential emergency and should be followed up as soon as possible or go to the Emergency Department if any problems should occur. ? ?Please show the CHEMOTHERAPY ALERT CARD or IMMUNOTHERAPY ALERT CARD at check-in  to the Emergency Department and triage nurse. ? ?Should you have questions after your visit or need to cancel or reschedule your appointment, please contact [facility]  Dept: [phone]  and follow the prompts.  Office hours are 8:00 a.m. to 4:30 p.m. Monday - Friday. Please note that voicemails left after 4:00 p.m. may not be returned until the following business day.  We are closed weekends and major holidays. You have access to a nurse at all times for urgent questions. Please call the main number to the clinic Dept: [phone] and follow the prompts. ? ? ?For any non-urgent questions, you may also contact your provider using MyChart. We now offer e-Visits for anyone 7 and older to request care online for non-urgent symptoms. For details visit [email or web address]. ?  ?Also download the MyChart app! Go to the app store, search "MyChart", open the app, select [facility], and log in with your MyChart username and password. ? ?Due to Covid, a mask is required upon entering the hospital/clinic. If you do not have a mask, one will be given to you upon arrival. For doctor visits, patients may have 1 support person aged 36 or older with them. For treatment visits, patients cannot have anyone with them due to current Covid guidelines and our immunocompromised population.  ? ?

## 2021-12-13 NOTE — Assessment & Plan Note (Signed)
[name] is an 81 year old [place] woman with multifactorial anemia. ? ?1.  Anemia of chronic inflammation.  She has been ANCA positive and has experienced hemolysis to this in the past.  She will receive rituximab today.  Due to the concern for her hemolyzing she will not receive a blood transfusion today for hemoglobin of 6.2 and instead will receive rituximab.  She will also start on prednisone 40 mg daily and we will see her back weekly for rituximab and to taper her prednisone ?2.  Anemia of renal insufficiency she is getting Retacrit every 4 weeks and tolerates this well. ?3.  Anemia of iron deficiency: She has required IV iron in the past.  Her ferritin today is 170.  She denies any recent blood in her stool or black tarry stool. ? ?[name] and I talked about the above in detail.  This was discussed and the plan was formulated with Dr. Lindi Adie.  She and I reviewed reasons to call or go to the emergency room for symptomatic anemia. ? ?We will see [name] back next week for labs, follow-up, and now weekly rituximab. ?

## 2021-12-15 ENCOUNTER — Encounter (HOSPITAL_COMMUNITY): Payer: Self-pay | Admitting: Emergency Medicine

## 2021-12-15 ENCOUNTER — Other Ambulatory Visit: Payer: Self-pay

## 2021-12-15 ENCOUNTER — Telehealth: Payer: Self-pay | Admitting: *Deleted

## 2021-12-15 ENCOUNTER — Emergency Department (HOSPITAL_COMMUNITY): Payer: Medicare HMO

## 2021-12-15 ENCOUNTER — Observation Stay (HOSPITAL_COMMUNITY)
Admission: EM | Admit: 2021-12-15 | Discharge: 2021-12-16 | Disposition: A | Discharge status: Home Health | Payer: Medicare HMO | Attending: Internal Medicine | Admitting: Internal Medicine

## 2021-12-15 ENCOUNTER — Telehealth: Payer: Self-pay | Admitting: Adult Health

## 2021-12-15 DIAGNOSIS — I129 Hypertensive chronic kidney disease with stage 1 through stage 4 chronic kidney disease, or unspecified chronic kidney disease: Secondary | ICD-10-CM | POA: Diagnosis not present

## 2021-12-15 DIAGNOSIS — E1122 Type 2 diabetes mellitus with diabetic chronic kidney disease: Secondary | ICD-10-CM | POA: Insufficient documentation

## 2021-12-15 DIAGNOSIS — R9389 Abnormal findings on diagnostic imaging of other specified body structures: Secondary | ICD-10-CM | POA: Diagnosis not present

## 2021-12-15 DIAGNOSIS — Z955 Presence of coronary angioplasty implant and graft: Secondary | ICD-10-CM | POA: Diagnosis not present

## 2021-12-15 DIAGNOSIS — Z952 Presence of prosthetic heart valve: Secondary | ICD-10-CM | POA: Diagnosis not present

## 2021-12-15 DIAGNOSIS — D649 Anemia, unspecified: Principal | ICD-10-CM | POA: Insufficient documentation

## 2021-12-15 DIAGNOSIS — R778 Other specified abnormalities of plasma proteins: Secondary | ICD-10-CM | POA: Diagnosis not present

## 2021-12-15 DIAGNOSIS — Z79899 Other long term (current) drug therapy: Secondary | ICD-10-CM | POA: Insufficient documentation

## 2021-12-15 DIAGNOSIS — R791 Abnormal coagulation profile: Secondary | ICD-10-CM | POA: Diagnosis not present

## 2021-12-15 DIAGNOSIS — Z951 Presence of aortocoronary bypass graft: Secondary | ICD-10-CM | POA: Diagnosis not present

## 2021-12-15 DIAGNOSIS — I7782 Antineutrophilic cytoplasmic antibody (ANCA) vasculitis: Secondary | ICD-10-CM

## 2021-12-15 DIAGNOSIS — Z20822 Contact with and (suspected) exposure to covid-19: Secondary | ICD-10-CM | POA: Insufficient documentation

## 2021-12-15 DIAGNOSIS — R42 Dizziness and giddiness: Secondary | ICD-10-CM | POA: Diagnosis not present

## 2021-12-15 DIAGNOSIS — G4489 Other headache syndrome: Secondary | ICD-10-CM | POA: Diagnosis not present

## 2021-12-15 DIAGNOSIS — J9811 Atelectasis: Secondary | ICD-10-CM | POA: Diagnosis not present

## 2021-12-15 DIAGNOSIS — R2681 Unsteadiness on feet: Secondary | ICD-10-CM | POA: Diagnosis not present

## 2021-12-15 DIAGNOSIS — N184 Chronic kidney disease, stage 4 (severe): Secondary | ICD-10-CM | POA: Insufficient documentation

## 2021-12-15 DIAGNOSIS — I2510 Atherosclerotic heart disease of native coronary artery without angina pectoris: Secondary | ICD-10-CM | POA: Insufficient documentation

## 2021-12-15 DIAGNOSIS — I480 Paroxysmal atrial fibrillation: Secondary | ICD-10-CM | POA: Insufficient documentation

## 2021-12-15 DIAGNOSIS — R918 Other nonspecific abnormal finding of lung field: Secondary | ICD-10-CM | POA: Diagnosis not present

## 2021-12-15 DIAGNOSIS — I272 Pulmonary hypertension, unspecified: Secondary | ICD-10-CM

## 2021-12-15 DIAGNOSIS — I959 Hypotension, unspecified: Secondary | ICD-10-CM | POA: Diagnosis not present

## 2021-12-15 DIAGNOSIS — R0789 Other chest pain: Secondary | ICD-10-CM | POA: Diagnosis not present

## 2021-12-15 DIAGNOSIS — E7800 Pure hypercholesterolemia, unspecified: Secondary | ICD-10-CM

## 2021-12-15 DIAGNOSIS — Z7901 Long term (current) use of anticoagulants: Secondary | ICD-10-CM | POA: Insufficient documentation

## 2021-12-15 DIAGNOSIS — I159 Secondary hypertension, unspecified: Secondary | ICD-10-CM

## 2021-12-15 DIAGNOSIS — D464 Refractory anemia, unspecified: Secondary | ICD-10-CM | POA: Diagnosis present

## 2021-12-15 DIAGNOSIS — Z87891 Personal history of nicotine dependence: Secondary | ICD-10-CM | POA: Diagnosis not present

## 2021-12-15 DIAGNOSIS — R0602 Shortness of breath: Secondary | ICD-10-CM | POA: Diagnosis not present

## 2021-12-15 DIAGNOSIS — R531 Weakness: Secondary | ICD-10-CM | POA: Diagnosis present

## 2021-12-15 DIAGNOSIS — R079 Chest pain, unspecified: Secondary | ICD-10-CM | POA: Diagnosis not present

## 2021-12-15 DIAGNOSIS — I10 Essential (primary) hypertension: Secondary | ICD-10-CM | POA: Diagnosis present

## 2021-12-15 DIAGNOSIS — Z7984 Long term (current) use of oral hypoglycemic drugs: Secondary | ICD-10-CM | POA: Insufficient documentation

## 2021-12-15 LAB — HEMOGLOBIN AND HEMATOCRIT, BLOOD
HCT: 23.4 % — ABNORMAL LOW (ref 36.0–46.0)
Hemoglobin: 7.9 g/dL — ABNORMAL LOW (ref 12.0–15.0)

## 2021-12-15 LAB — CBC WITH DIFFERENTIAL/PLATELET
Abs Immature Granulocytes: 0.21 10*3/uL — ABNORMAL HIGH (ref 0.00–0.07)
Basophils Absolute: 0 10*3/uL (ref 0.0–0.1)
Basophils Relative: 0 %
Eosinophils Absolute: 0.1 10*3/uL (ref 0.0–0.5)
Eosinophils Relative: 1 %
HCT: 20.8 % — ABNORMAL LOW (ref 36.0–46.0)
Hemoglobin: 6.8 g/dL — CL (ref 12.0–15.0)
Immature Granulocytes: 2 %
Lymphocytes Relative: 11 %
Lymphs Abs: 1 10*3/uL (ref 0.7–4.0)
MCH: 31.8 pg (ref 26.0–34.0)
MCHC: 32.7 g/dL (ref 30.0–36.0)
MCV: 97.2 fL (ref 80.0–100.0)
Monocytes Absolute: 1 10*3/uL (ref 0.1–1.0)
Monocytes Relative: 11 %
Neutro Abs: 6.6 10*3/uL (ref 1.7–7.7)
Neutrophils Relative %: 75 %
Platelets: 179 10*3/uL (ref 150–400)
RBC: 2.14 MIL/uL — ABNORMAL LOW (ref 3.87–5.11)
RDW: 16.6 % — ABNORMAL HIGH (ref 11.5–15.5)
WBC: 8.9 10*3/uL (ref 4.0–10.5)
nRBC: 0.9 % — ABNORMAL HIGH (ref 0.0–0.2)

## 2021-12-15 LAB — COMPREHENSIVE METABOLIC PANEL
ALT: 44 U/L (ref 0–44)
AST: 60 U/L — ABNORMAL HIGH (ref 15–41)
Albumin: 3.4 g/dL — ABNORMAL LOW (ref 3.5–5.0)
Alkaline Phosphatase: 77 U/L (ref 38–126)
Anion gap: 12 (ref 5–15)
BUN: 113 mg/dL — ABNORMAL HIGH (ref 8–23)
CO2: 17 mmol/L — ABNORMAL LOW (ref 22–32)
Calcium: 8.7 mg/dL — ABNORMAL LOW (ref 8.9–10.3)
Chloride: 105 mmol/L (ref 98–111)
Creatinine, Ser: 2.75 mg/dL — ABNORMAL HIGH (ref 0.44–1.00)
GFR, Estimated: 17 mL/min — ABNORMAL LOW (ref >60)
Glucose, Bld: 142 mg/dL — ABNORMAL HIGH (ref 70–99)
Potassium: 4.1 mmol/L (ref 3.5–5.1)
Sodium: 134 mmol/L — ABNORMAL LOW (ref 135–145)
Total Bilirubin: 0.4 mg/dL (ref 0.3–1.2)
Total Protein: 5.5 g/dL — ABNORMAL LOW (ref 6.5–8.1)

## 2021-12-15 LAB — TROPONIN I (HIGH SENSITIVITY)
Troponin I (High Sensitivity): 56 ng/L — ABNORMAL HIGH (ref <18)
Troponin I (High Sensitivity): 82 ng/L — ABNORMAL HIGH (ref <18)

## 2021-12-15 LAB — FERRITIN: Ferritin: 213 ng/mL (ref 11–307)

## 2021-12-15 LAB — PROTIME-INR
INR: 3.7 — ABNORMAL HIGH (ref 0.8–1.2)
Prothrombin Time: 36.4 seconds — ABNORMAL HIGH (ref 11.4–15.2)

## 2021-12-15 LAB — RESP PANEL BY RT-PCR (FLU A&B, COVID) ARPGX2
Influenza A by PCR: NEGATIVE
Influenza B by PCR: NEGATIVE
SARS Coronavirus 2 by RT PCR: NEGATIVE

## 2021-12-15 LAB — PREPARE RBC (CROSSMATCH)

## 2021-12-15 LAB — RETICULOCYTES
Immature Retic Fract: 29.6 % — ABNORMAL HIGH (ref 2.3–15.9)
RBC.: 2.12 MIL/uL — ABNORMAL LOW (ref 3.87–5.11)
Retic Count, Absolute: 120.6 10*3/uL (ref 19.0–186.0)
Retic Ct Pct: 5.7 % — ABNORMAL HIGH (ref 0.4–3.1)

## 2021-12-15 LAB — PROCALCITONIN: Procalcitonin: 0.3 ng/mL

## 2021-12-15 MED ORDER — TADALAFIL 20 MG PO TABS
40.0000 mg | ORAL_TABLET | Freq: Every day | ORAL | Status: DC
Start: 2021-12-15 — End: 2021-12-16
  Administered 2021-12-16: 40 mg via ORAL
  Filled 2021-12-15 (×2): qty 2

## 2021-12-15 MED ORDER — ASPIRIN EC 81 MG PO TBEC
81.0000 mg | DELAYED_RELEASE_TABLET | Freq: Every day | ORAL | Status: DC
  Administered 2021-12-15 – 2021-12-16 (×2): 81 mg via ORAL
  Filled 2021-12-15 (×2): qty 1

## 2021-12-15 MED ORDER — VITAMIN B-12 1000 MCG PO TABS
1000.0000 ug | ORAL_TABLET | Freq: Every day | ORAL | Status: DC
  Administered 2021-12-15 – 2021-12-16 (×2): 1000 ug via ORAL
  Filled 2021-12-15 (×2): qty 1

## 2021-12-15 MED ORDER — PANTOPRAZOLE SODIUM 40 MG PO TBEC
40.0000 mg | DELAYED_RELEASE_TABLET | Freq: Two times a day (BID) | ORAL | Status: DC
  Administered 2021-12-15 – 2021-12-16 (×3): 40 mg via ORAL
  Filled 2021-12-15 (×3): qty 1

## 2021-12-15 MED ORDER — BOOST PO LIQD
237.0000 mL | Freq: Every day | ORAL | Status: DC
  Administered 2021-12-15 – 2021-12-16 (×2): 237 mL via ORAL
  Filled 2021-12-15 (×2): qty 237

## 2021-12-15 MED ORDER — AMLODIPINE BESYLATE 10 MG PO TABS
10.0000 mg | ORAL_TABLET | Freq: Every day | ORAL | Status: DC
  Administered 2021-12-15: 14:00:00 10 mg via ORAL
  Filled 2021-12-15: qty 1
  Filled 2021-12-15: qty 2

## 2021-12-15 MED ORDER — METOPROLOL SUCCINATE ER 25 MG PO TB24
12.5000 mg | ORAL_TABLET | Freq: Every day | ORAL | Status: DC
  Administered 2021-12-15 – 2021-12-16 (×2): 12.5 mg via ORAL
  Filled 2021-12-15 (×2): qty 1

## 2021-12-15 MED ORDER — ATORVASTATIN CALCIUM 80 MG PO TABS
80.0000 mg | ORAL_TABLET | Freq: Every day | ORAL | Status: DC
  Administered 2021-12-15: 19:00:00 80 mg via ORAL
  Filled 2021-12-15: qty 1

## 2021-12-15 MED ORDER — WARFARIN - PHARMACIST DOSING INPATIENT
Freq: Every day | Status: DC
Start: 2021-12-15 — End: 2021-12-16

## 2021-12-15 MED ORDER — SPIRONOLACTONE 25 MG PO TABS
25.0000 mg | ORAL_TABLET | Freq: Every day | ORAL | Status: DC
  Administered 2021-12-15 – 2021-12-16 (×2): 25 mg via ORAL
  Filled 2021-12-15 (×2): qty 1

## 2021-12-15 MED ORDER — MAGNESIUM OXIDE -MG SUPPLEMENT 400 (240 MG) MG PO TABS
400.0000 mg | ORAL_TABLET | Freq: Every day | ORAL | Status: DC
  Administered 2021-12-15 – 2021-12-16 (×2): 400 mg via ORAL
  Filled 2021-12-15 (×2): qty 1

## 2021-12-15 MED ORDER — ONDANSETRON HCL 4 MG/2ML IJ SOLN: 4.0000 mg | Freq: Four times a day (QID) | INTRAMUSCULAR | Status: DC | PRN

## 2021-12-15 MED ORDER — ALPRAZOLAM 0.25 MG PO TABS
0.5000 mg | ORAL_TABLET | Freq: Every evening | ORAL | Status: DC | PRN
  Administered 2021-12-15: 21:00:00 0.5 mg via ORAL
  Filled 2021-12-15: qty 2

## 2021-12-15 MED ORDER — ONDANSETRON HCL 4 MG PO TABS: 4.0000 mg | ORAL_TABLET | Freq: Four times a day (QID) | ORAL | Status: DC | PRN

## 2021-12-15 MED ORDER — ACETAMINOPHEN 325 MG PO TABS: 650.0000 mg | ORAL_TABLET | Freq: Four times a day (QID) | ORAL | Status: DC | PRN

## 2021-12-15 MED ORDER — HYDRALAZINE HCL 50 MG PO TABS
100.0000 mg | ORAL_TABLET | Freq: Three times a day (TID) | ORAL | Status: DC
  Administered 2021-12-15 – 2021-12-16 (×5): 100 mg via ORAL
  Filled 2021-12-15 (×5): qty 2

## 2021-12-15 MED ORDER — FUROSEMIDE 40 MG PO TABS
80.0000 mg | ORAL_TABLET | Freq: Every day | ORAL | Status: DC
  Administered 2021-12-15 – 2021-12-16 (×2): 80 mg via ORAL
  Filled 2021-12-15: qty 4
  Filled 2021-12-15: qty 2

## 2021-12-15 MED ORDER — SODIUM CHLORIDE 0.9 % IV SOLN: 10.0000 mL/h | Freq: Once | INTRAVENOUS | Status: DC

## 2021-12-15 MED ORDER — ALLOPURINOL 100 MG PO TABS
100.0000 mg | ORAL_TABLET | Freq: Every day | ORAL | Status: DC
  Administered 2021-12-15 – 2021-12-16 (×2): 100 mg via ORAL
  Filled 2021-12-15 (×2): qty 1

## 2021-12-15 MED ORDER — ACETAMINOPHEN 650 MG RE SUPP: 650.0000 mg | Freq: Four times a day (QID) | RECTAL | Status: DC | PRN

## 2021-12-15 MED ORDER — ALBUTEROL SULFATE (2.5 MG/3ML) 0.083% IN NEBU: 2.5000 mg | INHALATION_SOLUTION | Freq: Four times a day (QID) | RESPIRATORY_TRACT | Status: DC | PRN

## 2021-12-15 MED ORDER — SODIUM CHLORIDE 0.9% FLUSH
3.0000 mL | Freq: Two times a day (BID) | INTRAVENOUS | Status: DC
  Administered 2021-12-16: 3 mL via INTRAVENOUS

## 2021-12-15 MED ORDER — TADALAFIL (PAH) 20 MG PO TABS: 40.0000 mg | ORAL_TABLET | Freq: Every day | ORAL | Status: DC

## 2021-12-15 MED ORDER — CLONIDINE HCL 0.2 MG PO TABS
0.2000 mg | ORAL_TABLET | Freq: Two times a day (BID) | ORAL | Status: DC
  Administered 2021-12-15 – 2021-12-16 (×3): 0.2 mg via ORAL
  Filled 2021-12-15 (×3): qty 1

## 2021-12-15 MED ORDER — PREDNISONE 20 MG PO TABS
40.0000 mg | ORAL_TABLET | Freq: Every day | ORAL | Status: DC
  Administered 2021-12-15: 14:00:00 40 mg via ORAL
  Filled 2021-12-15: qty 2

## 2021-12-15 MED ORDER — FOLIC ACID 1 MG PO TABS
1.0000 mg | ORAL_TABLET | Freq: Every day | ORAL | Status: DC
  Administered 2021-12-15 – 2021-12-16 (×2): 1 mg via ORAL
  Filled 2021-12-15 (×2): qty 1

## 2021-12-15 NOTE — Telephone Encounter (Signed)
Scheduled appointment per 3/7 los. Left message. Called patients son Thurmond Butts), and he is aware of patients upcoming appointment. Patients son asked for a follow up call on Monday 3/13 due to patient being at the hospital and was unsure if appointment will need to be cancelled. ?

## 2021-12-15 NOTE — Assessment & Plan Note (Addendum)
Patient has mechanical aortic valve.  She is on Coumadin and INR elevated at 3.7. ?-Goal INR 2-3 ?-Coumadin per pharmacy ?

## 2021-12-15 NOTE — ED Provider Notes (Signed)
[facility] EMERGENCY DEPARTMENT Provider Note   CSN: [id number] Arrival date & time: 12/15/21  1594     History  Chief Complaint  Patient presents with   Weakness    [name] is a 81 y.o. female.  Patient with history of hypertension, diabetes, CHF, mechanical valve replacement on Coumadin, ANCA vasculitis, anemia, and CKD presents today with complaint of weakness, shortness of breath, and fatigue.  She states that she was seen at her hematology/oncology office on Tuesday and she was told that her hemoglobin was 6.2, however at that time she was asymptomatic and there was concern that she was actively hemolyzing and therefore decision was made to defer transfusion and give Rituximab and prednisone. She states that over the past 24 hours she has began to feel more weak and fatigued with shortness of breath and lightheadedness on exertion. She lives alone and is normally able to ambulate with a walker and perform ADLs without assistance, however she states that since onset of symptoms she has been using a walker and spending most of her time in bed. She states that she has had several times in the past where her hemoglobin has dropped requiring blood transfusions. She states that she currently feels extremely similar to when she has had low hemoglobin requiring transfusion in the past. She denies fevers, chills, chest pain, nausea, vomiting, or diarrhea. Also denies hematochezia and melena.  The history is provided by the patient. No language interpreter was used.  Weakness Associated symptoms: headaches and shortness of breath   Associated symptoms: no abdominal pain, no chest pain, no cough, no diarrhea, no dizziness, no fever, no nausea, no seizures and no vomiting       Home Medications Prior to Admission medications   Medication Sig Start Date End Date Taking? Authorizing Provider  acetaminophen (TYLENOL) 325 MG tablet Take 650 mg by mouth every 6 (six) hours  as needed for mild pain.     [provider]  allopurinol (ZYLOPRIM) 100 MG tablet Take 100 mg by mouth daily.    [provider]  ALPRAZolam Duanne Moron) 0.5 MG tablet Take 0.25 mg by mouth at bedtime.    [provider]  ALPRAZolam Duanne Moron) 0.5 MG tablet Take 1 tablet by mouth at bedtime as needed. 07/27/21   [provider]  amLODipine (NORVASC) 10 MG tablet TAKE 1 TABLET EVERY DAY 05/13/21   [name], MD  atorvastatin (LIPITOR) 80 MG tablet Take 1 tablet (80 mg total) by mouth daily at 6 PM. 11/22/18   [name], MD  Blood Glucose Monitoring Suppl (TRUE METRIX METER) w/Device KIT  03/18/21   [provider]  Cholecalciferol (VITAMIN D3) 1000 UNITS CAPS Take 2,000 Units by mouth daily with lunch.     [provider]  ciprofloxacin (CIPRO) 500 MG tablet Take 500 mg by mouth daily as needed. 06/15/21   [provider]  cloNIDine (CATAPRES) 0.2 MG tablet TAKE 1 TABLET TWICE DAILY 06/22/21   [name], MD  Cyanocobalamin (VITAMIN B12) 1000 MCG TBCR Take 1,000 mcg by mouth daily with lunch.    [provider]  estradiol (ESTRACE) 0.1 MG/GM vaginal cream Place 0.5g nightly for two weeks then twice a week after 09/19/21   Jaquita Folds, MD  furosemide (LASIX) 40 MG tablet TAKE 2 TABLETS (80 MG TOTAL) IN MORNING AND 1 AND 1/2 TABLETS (60 MG TOTAL) IN EVENING. CHANGE IN DOSAGE. 08/11/21   Clegg, Amy D, NP  glucose  blood (TRUE METRIX BLOOD GLUCOSE TEST) test strip Use 1-2 times daily    [provider]  hydrALAZINE (APRESOLINE) 100 MG tablet TAKE 1 TABLET THREE TIMES DAILY 12/16/20   [name], MD  lactose free nutrition (BOOST) LIQD Take 237 mLs by mouth daily. (To supplement meals)     [provider]  magnesium oxide (MAG-OX) 400 (241.3 Mg) MG tablet Take 1 tablet (400 mg total) by mouth daily. 01/30/18   [name], MD  metoprolol succinate (TOPROL-XL) 25 MG 24 hr tablet TAKE 1/2  TABLET EVERY DAY 07/19/21   [name], MD  pantoprazole (PROTONIX) 40 MG tablet Take 40 mg by mouth 2 (two) times daily.  12/05/14   [provider]  predniSONE (DELTASONE) 20 MG tablet Take 2 tablets (40 mg total) by mouth daily with breakfast. 12/13/21   Gardenia Phlegm, NP  spironolactone (ALDACTONE) 25 MG tablet TAKE 1 TABLET EVERY DAY 05/13/21   [name], MD  tadalafil, PAH, (ADCIRCA) 20 MG tablet Take 2 tablets (40 mg total) by mouth daily. 12/06/21   [name], MD  TRUE METRIX BLOOD GLUCOSE TEST test strip SMARTSIG:Via Meter 04/08/21   [provider]  vitamin C (ASCORBIC ACID) 500 MG tablet Take 500 mg by mouth daily with lunch.    [provider]  warfarin (COUMADIN) 5 MG tablet Take 1 to 1 &1/2 tablets by mouth daily as directed by the coumadin clinic 12/06/21   Lorretta Harp, MD      Allergies    Flagyl [metronidazole], Ciprofloxacin, Coreg [carvedilol], Losartan, Sulfamethoxazole-trimethoprim, Verapamil, Zetia [ezetimibe], Zocor [simvastatin - high dose], and Gatifloxacin    Review of Systems   Review of Systems  Constitutional:  Negative for chills and fever.  Respiratory:  Positive for shortness of breath. Negative for cough, wheezing and stridor.   Cardiovascular:  Negative for chest pain.  Gastrointestinal:  Negative for abdominal pain, diarrhea, nausea and vomiting.  Neurological:  Positive for weakness, light-headedness and headaches. Negative for dizziness, tremors, seizures, syncope, facial asymmetry, speech difficulty and numbness.  Psychiatric/Behavioral:  Negative for confusion and decreased concentration.   All other systems reviewed and are negative.  Physical Exam Updated Vital Signs Temp 98.5 F (36.9 C) (Oral)  Physical Exam Vitals and nursing note reviewed.  Constitutional:      General: She is not in acute distress.    Appearance: Normal appearance. She is normal weight. She is not ill-appearing,  toxic-appearing or diaphoretic.     Comments: Patient somewhat chronically ill appearing resting comfortably in bed in no acute distress  HENT:     Head: Normocephalic and atraumatic.     Mouth/Throat:     Mouth: Mucous membranes are pale.  Cardiovascular:     Rate and Rhythm: Normal rate and regular rhythm.     Heart sounds: Normal heart sounds.  Pulmonary:     Effort: Pulmonary effort is normal. No respiratory distress.     Breath sounds: Normal breath sounds. No wheezing or rales.  Abdominal:     General: Abdomen is flat.     Palpations: Abdomen is soft.  Musculoskeletal:        General: Normal range of motion.     Cervical back: Normal range of motion.  Skin:    General: Skin is warm and dry.  Neurological:     General: No focal deficit present.     Mental Status: She is alert.  Psychiatric:  Mood and Affect: Mood normal.        Behavior: Behavior normal.    ED Results / Procedures / Treatments   Labs (all labs ordered are listed, but only abnormal results are displayed) Labs Reviewed  CBC WITH DIFFERENTIAL/PLATELET - Abnormal; Notable for the following components:      Result Value   RBC 2.14 (*)    Hemoglobin 6.8 (*)    HCT 20.8 (*)    RDW 16.6 (*)    nRBC 0.9 (*)    Abs Immature Granulocytes 0.21 (*)    All other components within normal limits  PROTIME-INR - Abnormal; Notable for the following components:   Prothrombin Time 36.4 (*)    INR 3.7 (*)    All other components within normal limits  RETICULOCYTES - Abnormal; Notable for the following components:   Retic Ct Pct 5.7 (*)    RBC. 2.12 (*)    Immature Retic Fract 29.6 (*)    All other components within normal limits  COMPREHENSIVE METABOLIC PANEL - Abnormal; Notable for the following components:   Sodium 134 (*)    CO2 17 (*)    Glucose, Bld 142 (*)    BUN 113 (*)    Creatinine, Ser 2.75 (*)    Calcium 8.7 (*)    Total Protein 5.5 (*)    Albumin 3.4 (*)    AST 60 (*)    GFR, Estimated 17  (*)    All other components within normal limits  TROPONIN I (HIGH SENSITIVITY) - Abnormal; Notable for the following components:   Troponin I (High Sensitivity) 56 (*)    All other components within normal limits  RESP PANEL BY RT-PCR (FLU A&B, COVID) ARPGX2  FERRITIN  TYPE AND SCREEN  PREPARE RBC (CROSSMATCH)  TROPONIN I (HIGH SENSITIVITY)    EKG None  Radiology DG Chest 2 View  Result Date: 12/15/2021 CLINICAL DATA:  Shortness of breath EXAM: CHEST - 2 VIEW COMPARISON:  07/07/2019 FINDINGS: No new consolidation. Patchy left basilar atelectasis. Few calcified granulomas. Chronic blunting of the costophrenic angles. Similar cardiomegaly. IMPRESSION: Patchy left basilar atelectasis. Electronically Signed   By: Macy Mis M.D.   On: 12/15/2021 07:45    Procedures Procedures    Medications Ordered in ED Medications  0.9 %  sodium chloride infusion (has no administration in time range)    ED Course/ Medical Decision Making/ A&P                           Medical Decision Making Amount and/or Complexity of Data Reviewed Labs: ordered. Radiology: ordered.   This patient presents to the ED for concern of weakness, fatigue, this involves an extensive number of treatment options, and is a complaint that carries with it a high risk of complications and morbidity.  The differential diagnosis includes ACS, pneumonia, electrolyte imbalances, symptomatic anemia   Co morbidities that complicate the patient evaluation  hypertension, diabetes, CHF, mechanical valve replacement on Coumadin, ANCA vasculitis, anemia, and CKD   Additional history obtained:  Additional history obtained from previous oncology notes   Lab Tests:  I Ordered, and personally interpreted labs.  The pertinent results include:  no leukocytosis, anemia at 6.8, reticulocytes 5.7, troponin 56   Imaging Studies ordered:  I ordered imaging studies including CXR  I independently visualized and interpreted  imaging which showed patchy left basilar atelectasis I agree with the radiologist interpretation   Cardiac Monitoring:  The patient was  maintained on a cardiac monitor.  I personally viewed and interpreted the cardiac monitored which showed an underlying rhythm of: no STEMI   Medicines ordered and prescription drug management:  I ordered medication including packed RBCs  for transfusion with symptomatic anemia  Reevaluation of the patient after these medicines showed that the patient  this has not been completed at time of admission   Critical Interventions:  RBC transfusion for symptomatic anemia   Consultations Obtained:  I requested consultation with the hematology/oncology,  and discussed lab and imaging findings as well as pertinent plan - they recommend: Admission for symptomatic anemia with 1 unit packed RBCs today and a second unit either tonight or tomorrow   Patient with significant history of multifactorial anemia presents today with weakness, fatigue, and exertional shortness of breath.  Was seen at her oncologist 2 days ago and found to be anemic at 6.2.  However, decision was made to give rituximab and prednisone instead of RBC transfusion as patient was asymptomatic and concern to be hemolyzing at that time.  She has become progressively more symptomatic over the past 24 hours with symptoms that she states are very similar to previous when she is required transfusion.  Hemoglobin is 6.8 today, some troponin elevation noted however patient denies chest pain. Suspect her symptoms are related to symptomatic anemia. Will admit for same. Patient understanding and amenable with plan.   Discussed patient with hospitalist who accepts for admission.   This is a shared visit with supervising physician Dr. Sherry Ruffing who has independently evaluated patient & provided guidance in evaluation/management/disposition, in agreement with care    Final Clinical Impression(s) / ED  Diagnoses Final diagnoses:  Symptomatic anemia    Rx / DC Orders ED Discharge Orders     None         Nestor Lewandowsky 12/15/21 0165    Tegeler, Gwenyth Allegra, MD 12/15/21 [phone]

## 2021-12-15 NOTE — ED Notes (Signed)
Assumed pt care at this time

## 2021-12-15 NOTE — ED Notes (Signed)
Ambulated pt to and from bathroom w/ 1 assist ?

## 2021-12-15 NOTE — Assessment & Plan Note (Signed)
Home blood pressure regimen includes amlodipine 10 mg daily, clonidine 0.2 mg twice daily, furosemide 80 mg daily, hydralazine 100 mg 3 times daily, spironolactone 25 mg daily, metoprolol succinate 12.5 mg daily, and tadalafil 40 mg daily ?-Continue home regimen as tolerated ?

## 2021-12-15 NOTE — Telephone Encounter (Signed)
Pt's son called and stated that pt was put on Prednisone and pt's son wondering what to do. Pt currently in the ED with a low hemoglobin. Pt's son stated that she is to be admitted. Informed son that the hospital will manage her warfarin if she is admitted.  Informed pt's son to let us know when she is discharged so we can make pt an appointment.  ?

## 2021-12-15 NOTE — ED Triage Notes (Signed)
Pt brought to ED by [facility] via stretcher with a complaint of weakness x3-4 days. Pt states "when I get up and try to move around my head starts pounding". Pt also states she has periods of feeling like she is "smothering and her heart is pounding". Received chemotherapy on Tuesday. Denies nausea or vomiting. States she has not had BM in two days but denies any urinary symptoms.  ?

## 2021-12-15 NOTE — Assessment & Plan Note (Signed)
Continue atorvastatin

## 2021-12-15 NOTE — Assessment & Plan Note (Addendum)
Acute.  Patient reports having complaints of pounding chest pain whenever up and moving.    However high-sensitivity troponin 56->82 and have not been noted to be elevated prior.  EKG noted some ST wave changes. Suspect symptoms are secondary to patient's anemia. ?-Discussed with Dr. Harriet Masson without symptoms likely secondary to demand in setting of patient's anemia.  Can consider checking echocardiogram after second unit of blood transfused tomorrow if patient's symptoms persist. ? ?

## 2021-12-15 NOTE — Assessment & Plan Note (Signed)
-   Continue tadalafil ?

## 2021-12-15 NOTE — Plan of Care (Signed)
  Problem: Education: Goal: Knowledge of General Education information will improve Description Including pain rating scale, medication(s)/side effects and non-pharmacologic comfort measures Outcome: Progressing   Problem: Health Behavior/Discharge Planning: Goal: Ability to manage health-related needs will improve Outcome: Progressing   

## 2021-12-15 NOTE — Progress Notes (Signed)
ANTICOAGULATION CONSULT NOTE - Initial Consult ? ?Pharmacy Consult for warfarin ?Indication:  mechanical AVR ? ?Allergies  ?Allergen Reactions  ? Flagyl [Metronidazole] Rash  ?  ALL-OVER BODY RASH  ? Ciprofloxacin   ?  Other reaction(s): Stomach upset - tolerates ?Other reaction(s): Stomach upset - tolerates  ? Coreg [Carvedilol] Other (See Comments)  ?  Terrible cramping in the feet and had a lot of bowel movements, but not diarrhea  ? Losartan Swelling  ?  Patient doesn't recall site of swelling  ? Sulfamethoxazole-Trimethoprim   ?  Other reaction(s): stomach upset .  ? Verapamil Hives  ? Zetia [Ezetimibe] Other (See Comments)  ?  Reaction not recalled  ? Zocor [Simvastatin - High Dose] Other (See Comments)  ?  Reaction not recalled  ? Gatifloxacin Rash  ?  Redness to skin around eye  ? ? ?Patient Measurements: ?Height: '4\' 11"'$  (149.9 cm) ?Weight: 57.9 kg (127 lb 11.2 oz) ?IBW/kg (Calculated) : 43.2 ? ?Vital Signs: ?Temp: 98.3 ?F (36.8 ?C) (03/09 1245) ?Temp Source: Oral (03/09 1245) ?BP: 136/71 (03/09 1330) ?Pulse Rate: 56 (03/09 1330) ? ?Labs: ?Recent Labs  ?  12/13/21 ?1002 12/13/21 ?1202 12/15/21 ?0754 12/15/21 ?0820 12/15/21 ?0931  ?HGB 6.2*  --  6.8*  --   --   ?HCT 19.0*  --  20.8*  --   --   ?PLT 147*  --  179  --   --   ?LABPROT  --   --  36.4*  --   --   ?INR  --   --  3.7*  --   --   ?CREATININE  --  2.78*  --  2.75*  --   ?TROPONINIHS  --   --   --  56* 82*  ? ? ?Estimated Creatinine Clearance: 12.2 mL/min (A) (by C-G formula based on SCr of 2.75 mg/dL (H)). ? ? ?Medical History: ?Past Medical History:  ?Diagnosis Date  ? Aortic atherosclerosis ([place]) 01/23/2018  ? Atrial fibrillation ([place])   ? Carotid artery disease ([place])   ? Cholelithiasis 01/23/2018  ? Chronic anticoagulation   ? Coronary artery disease   ? status post coronary artery bypass grafting times 07/10/2004  ? Diabetes mellitus   ? Hypercholesteremia   ? Hypertension   ? Mechanical heart valve present   ? H. aortic valve replacement at the  time of bypass surgery October 2005  ? Moderate to severe pulmonary hypertension ([facility])   ? Peripheral arterial disease ([place])   ? history of left common iliac artery PTA and stenting for a chronic total occlusion 08/26/01  ? ? ?Assessment: ?81 yo F admitted for symptomatic anemia with Hgb 6.2. Pt has received 1u pRBCs in the ED. Pt is on warfarin therapy at home for mechanical AVR. Home regimen is warfarin '5mg'$  MWF and 7.'5mg'$  the rest of the week. Last warfarin dose was '5mg'$  on 12/14/21 @ 1800. No s/sx of bleeding.  ? ?INR on admission: 3.7, supratherapeutic ? ?Goal of Therapy:  ?INR 2-3 ?Monitor platelets by anticoagulation protocol: Yes ?  ?Plan:  ?HOLD warfarin dose on 12/15/21. ?Check INR and CBC with AM labs and monitor s/sx of bleeding. ? ?Kaleen Mask ?12/15/2021,1:53 PM ? ? ?

## 2021-12-15 NOTE — Progress Notes (Addendum)
HEMATOLOGY-ONCOLOGY PROGRESS NOTE ? ?ASSESSMENT AND PLAN: ?1.  Symptomatic anemia, multifactorial and due to anemia of chronic inflammation, anemia due to renal insufficiency, iron deficiency, and mild hemolysis ?2.  Elevated troponin likely due to demand ischemia ?3.  Supratherapeutic INR-has mechanical aortic valve ?4.  CKD ?5.  Vasculitis, ANCA positive ?6.  Moderate to severe pulmonary hypertension ?7.  Hypercholesterolemia ?8.  Hypertension ? ?-Labs from this hospitalization as well as our office have been reviewed.  She has been receiving rituximab for hemolytic anemia.  She also receives Retacrit for anemia due to CKD.  Labs do not indicate hemolysis.  Therefore, will discontinue prednisone.  She is already receiving vitamin B12 and we plan to continue this.  I have added folic acid 1 mg daily. ?-She has received 1 unit PRBC so far today.  Recommend repeating 1 unit tomorrow morning.  If she is feeling better, she can be considered for discharge to home from our perspective. ?-She has outpatient follow-up already scheduled in our office on 3/14 for her next rituximab infusion.  Will defer to her primary hematologist about changing the frequency of her Retacrit injections.  She should continue oral vitamin H73 and folic acid when she is ready for discharge. ? ?Mikey Bussing, DNP, AGPCNP-BC, AOCNP ? ?SUBJECTIVE: [name] is followed by our office for multifactorial anemia including anemia of chronic inflammation, mild hemolysis, and anemia of renal insufficiency.  She was seen in our office on 12/13/2021 for follow-up of anemia.  Her hemoglobin was 6.2 in our office that day.  She received her rituximab.  She was also started on prednisone 40 mg daily due to concern for hemolysis.  She received a Retacrit injection on that date as well.  She came to the emergency department with weakness.  Hemoglobin was 6.8 today.  Her BUN was 113 and creatinine was 2.75.  Her INR was elevated at 3.7.  She has received 1 unit  PRBC so far today.  She is not having any bleeding. ? ?Labs obtained in our office on 12/13/2021 showed a normal absolute reticulocyte count at 78.8 with a mildly elevated immature reticulocyte fraction of 16.6%.  Coombs test negative. ? ?REVIEW OF SYSTEMS:   ?Review of Systems  ?Constitutional:  Positive for malaise/fatigue. Negative for chills and fever.  ?HENT: Negative.    ?Respiratory: Negative.    ?Cardiovascular: Negative.   ?Skin: Negative.   ?Endo/Heme/Allergies: Negative.   ?Psychiatric/Behavioral: Negative.    ? ?I have reviewed the past medical history, past surgical history, social history and family history with the patient and they are unchanged from previous note. ? ? ?PHYSICAL EXAMINATION: ? ?Vitals:  ? 12/15/21 1430 12/15/21 1500  ?BP: (!) 151/45 (!) 153/56  ?Pulse: 61 63  ?Resp: 19 20  ?Temp:    ?SpO2: 98% 98%  ? ?Filed Weights  ? 12/15/21 0848  ?Weight: 57.9 kg  ? ? ?Intake/Output from previous day: ?No intake/output data recorded. ? ?Physical Exam ?Vitals reviewed.  ?Constitutional:   ?   General: She is not in acute distress. ?HENT:  ?   Head: Normocephalic.  ?Skin: ?   General: Skin is warm and dry.  ?   Coloration: Skin is pale.  ?Neurological:  ?   Mental Status: She is alert and oriented to person, place, and time.  ?Psychiatric:     ?   Mood and Affect: Mood normal.     ?   Behavior: Behavior normal.     ?   Thought Content: Thought  content normal.     ?   Judgment: Judgment normal.  ? ? ?LABORATORY DATA:  ?I have reviewed the data as listed ?CMP Latest Ref Rng & Units 12/15/2021 12/13/2021 05/24/2021  ?Glucose 70 - 99 mg/dL 142(H) 152(H) 195(H)  ?BUN 8 - 23 mg/dL 113(H) 107(H) 84(H)  ?Creatinine 0.44 - 1.00 mg/dL 2.75(H) 2.78(H) 2.50(H)  ?Sodium 135 - 145 mmol/L 134(L) 133(L) 134(L)  ?Potassium 3.5 - 5.1 mmol/L 4.1 4.7 4.4  ?Chloride 98 - 111 mmol/L 105 104 104  ?CO2 22 - 32 mmol/L 17(L) 19(L) 20(L)  ?Calcium 8.9 - 10.3 mg/dL 8.7(L) 9.1 9.2  ?Total Protein 6.5 - 8.1 g/dL 5.5(L) 6.4(L) 6.4(L)   ?Total Bilirubin 0.3 - 1.2 mg/dL 0.4 0.3 0.4  ?Alkaline Phos 38 - 126 U/L 77 79 91  ?AST 15 - 41 U/L 60(H) 18 20  ?ALT 0 - 44 U/L 44 11 14  ? ? ?Lab Results  ?Component Value Date  ? WBC 8.9 12/15/2021  ? HGB 6.8 (LL) 12/15/2021  ? HCT 20.8 (L) 12/15/2021  ? MCV 97.2 12/15/2021  ? PLT 179 12/15/2021  ? NEUTROABS 6.6 12/15/2021  ? ? ?No results found for: CEA1, CEA, [id number], CA125, PSA1 ? ?DG Chest 2 View ? ?Result Date: 12/15/2021 ?CLINICAL DATA:  Shortness of breath EXAM: CHEST - 2 VIEW COMPARISON:  07/07/2019 FINDINGS: No new consolidation. Patchy left basilar atelectasis. Few calcified granulomas. Chronic blunting of the costophrenic angles. Similar cardiomegaly. IMPRESSION: Patchy left basilar atelectasis. Electronically Signed   By: Macy Mis M.D.   On: 12/15/2021 07:45   ? ? ?Future Appointments  ?Date Time Provider [place]  ?12/20/2021  8:30 AM [facility] LAB [facility] None  ?12/20/2021  8:30 AM Benay Pike, MD [facility] None  ?12/20/2021  9:15 AM [facility] INFUSION [facility] None  ?12/22/2021  8:30 AM [facility] INFUSION [facility] None  ?12/26/2021  8:30 AM [facility] LAB [facility] None  ?12/26/2021  9:00 AM Benay Pike, MD [facility] None  ?12/26/2021 10:00 AM [facility] INFUSION [facility] None  ?01/02/2022  8:45 AM [facility] LAB [facility] None  ?01/02/2022  9:15 AM Benay Pike, MD [facility] None  ?01/02/2022 10:00 AM [facility] INFUSION [facility] None  ?01/03/2022  9:45 AM Elisha Ponder Stephani Police, DPM [facility]  ?01/05/2022 11:15 AM [facility]  ?01/10/2022  9:30 AM Jon Billings, RN [facility] None  ?  ? ? LOS: 0 days  ? ?Addendum ?I have seen the patient, examined her. I agree with the assessment and and plan and have edited the notes.  ? ?81 yo female with history of anemia from chronic disease, especially CKD and rheumatological disorder, mild hemolysis, and a history of iron deficiency from GI bleeding.  She  recently presented with worsening anemia, was seen in our office 2 days ago and received Rituxan and started on prednisone.  Her Coombs test came back negative, her reticulocyte count was slightly elevated, I do not think prednisone is much beneficial and I will stop it.  I recommend a blood transfusion, she would benefit from 2 units. If she feels better after blood transfusion, okay to discharge tomorrow.  She has a follow-up appointment with [place] next Tuesday. All questions were answered.  ? ?Truitt Merle  ?12/15/2021  ?

## 2021-12-15 NOTE — Assessment & Plan Note (Signed)
Patient does not report any complaints of fever or cough.  Chest x-ray noted left lower lobe basilar atelectasis. ?-Add on procalcitonin ?-Incentive spirometry ?

## 2021-12-15 NOTE — Assessment & Plan Note (Addendum)
Patient presents with complaints of weakness found to have hemoglobin 6.8 g/dL improved from prior 2 days ago of 6.2 g/dL when she had received Epoetin.  Case was discussed with Dr. Burr Medico who recommended transfusing patient 1 unit today and then second unit tomorrow morning.  She received the first unit while in the ED. ?-Admit to a medical telemetry bed ?-Order placed to transfuse second unit of packed red blood cells tomorrow morning ?-PT to eval and treat ?

## 2021-12-15 NOTE — H&P (Addendum)
History and Physical    Patient: [name] DOB: 12/14/1940 DOA: 12/15/2021 DOS: the patient was seen and examined on 12/15/2021 PCP: [name], MD  Patient coming from: Home via EMS  Chief Complaint:  Chief Complaint  Patient presents with   Weakness   HPI: [name] is a 81 y.o. female with medical history significant of hypertension, hyperlipidemia, pancytopenia, CAD s/p CABG in 2005, s/p mechanical aortic valve, DM type II, ANCA vasculitis on Rituximab who presents with complaints of weakness over the last 3 to 4 days.  She notes that as long as she is sitting still she is okay, but whenever she is getting up and moving around her head and heart feel like it is pounding.  Reports associated symptoms of chest pain and shortness of breath.  Chest pain that she describes as the pounding sensation resolves with rest.  Denies having any nausea, vomiting, diarrhea, or gross blood in stools to her knowledge.  She is chronically on iron so her stools are always dark and she reports that she always has leg swelling worse on the left than on the right.  She normally get around with a cane, but felt so weak that she would have likely needed to use her walker.  She is on aspirin as well as Coumadin and states that her INR had been up a little elevated earlier in the week.  She had just received Rituximab and Epoetin infusions 2 days ago without improvement in symptoms.  Upon admission into the emergency department patient was seen to be afebrile with Blood pressures elevated up to 175/45.  Labs significant for hemoglobin 6.8, sodium 134, CO2 17, BUN 113, creatinine 2.75, glucose 142, INR 3.7, high-sensitivity troponin 56->82.  Chest x-ray noted patchy left basilar atelectasis.  Influenza and COVID-19 screening were negative.  Was typed and screened and ordered to be transfused 1 unit of packed red blood cells today and a second unit in a.m.  Review of Systems: As mentioned in  the history of present illness. All other systems reviewed and are negative. Past Medical History:  Diagnosis Date   Aortic atherosclerosis ([place]) 01/23/2018   Atrial fibrillation ([facility])    Carotid artery disease ([place])    Cholelithiasis 01/23/2018   Chronic anticoagulation    Coronary artery disease    status post coronary artery bypass grafting times 07/10/2004   Diabetes mellitus    Hypercholesteremia    Hypertension    Mechanical heart valve present    H. aortic valve replacement at the time of bypass surgery October 2005   Moderate to severe pulmonary hypertension ([place])    Peripheral arterial disease ([place])    history of left common iliac artery PTA and stenting for a chronic total occlusion 08/26/01   Past Surgical History:  Procedure Laterality Date   anterior repair  2009   AORTIC VALVE REPLACEMENT  2005   Dr. Cyndia Bent   CARDIAC CATHETERIZATION  11/10/2004   40% right common illiac, 70% in stent restenosis of distal left common illiac,    CARDIAC CATHETERIZATION  05/18/2004   LAD 50-70% midstenosis, RCA dominant w/50% stenosis, 50% Right common Illiac artery ostial stenosis, 90% in stent restenosis within midportion of left common illiac stent   Carotid Duplex  03/12/2012   RSA-elev. velocities suggestive of a 50-69% diameter reduction, Right&Left Bulb/Prox ICA-mild-mod.fibrous plaqueelevating Velocities abnormal study.   CHOLECYSTECTOMY N/A 03/01/2018   Procedure: LAPAROSCOPIC CHOLECYSTECTOMY;  Surgeon: Judeth Horn, MD;  Location: [facility];  Service: General;  Laterality: N/A;   COLONOSCOPY WITH PROPOFOL N/A 01/22/2018   Procedure: COLONOSCOPY WITH PROPOFOL;  Surgeon: Wilford Corner, MD;  Location: [place];  Service: Endoscopy;  Laterality: N/A;   ESOPHAGOGASTRODUODENOSCOPY (EGD) WITH PROPOFOL N/A 01/22/2018   Procedure: ESOPHAGOGASTRODUODENOSCOPY (EGD) WITH PROPOFOL;  Surgeon: Wilford Corner, MD;  Location: [place];  Service: Endoscopy;  Laterality: N/A;    ESOPHAGOGASTRODUODENOSCOPY (EGD) WITH PROPOFOL N/A 11/21/2018   Procedure: ESOPHAGOGASTRODUODENOSCOPY (EGD) WITH PROPOFOL;  Surgeon: [name], MD;  Location: [place] [facility];  Service: Gastroenterology;  Laterality: N/A;   ESOPHAGOGASTRODUODENOSCOPY (EGD) WITH PROPOFOL Left 02/27/2019   Procedure: ESOPHAGOGASTRODUODENOSCOPY (EGD) WITH PROPOFOL;  Surgeon: Arta Silence, MD;  Location[facility];  Service: Endoscopy;  Laterality: Left;   GIVENS CAPSULE STUDY N/A 11/21/2018   Procedure: GIVENS CAPSULE STUDY;  Surgeon: [name], MD;  Location: [place];  Service: Gastroenterology;  Laterality: N/A;  To be deployed during EGD   Lower Ext. Duplex  03/12/2012   Right Proximal CIA- vessel narrowing w/elevated velocities 0-49% diameter reduction. Right SFA-mild mixed density plaque throughout vessel.   NM MYOCAR PERF WALL MOTION  05/19/2010   protocol: Persantine, post stress EF 65%, negative for ischemia, low risk scan   RIGHT HEART CATH N/A 06/27/2018   Procedure: RIGHT HEART CATH;  Surgeon: [name], MD;  Location: [facility];  Service: Cardiovascular;  Laterality: N/A;   TOTAL ABDOMINAL HYSTERECTOMY W/ BILATERAL SALPINGOOPHORECTOMY  1989   TRANSTHORACIC ECHOCARDIOGRAM  08/29/2012   Moderately calcified annulus of mitral valve, moderate regurg. of both mitral valve and tricuspid valve.    Social History:  reports that she has quit smoking. Her smoking use included cigarettes. She has a 30.00 pack-year smoking history. She has never used smokeless tobacco. She reports that she does not drink alcohol and does not use drugs.  Allergies  Allergen Reactions   Flagyl [Metronidazole] Rash    ALL-OVER BODY RASH   Ciprofloxacin     Other reaction(s): Stomach upset - tolerates Other reaction(s): Stomach upset - tolerates   Coreg [Carvedilol] Other (See Comments)    Terrible cramping in the feet and had a lot of bowel movements, but not diarrhea   Losartan Swelling    Patient  doesn't recall site of swelling   Sulfamethoxazole-Trimethoprim     Other reaction(s): stomach upset .   Verapamil Hives   Zetia [Ezetimibe] Other (See Comments)    Reaction not recalled   Zocor [Simvastatin - High Dose] Other (See Comments)    Reaction not recalled   Gatifloxacin Rash    Redness to skin around eye    Family History  Problem Relation Age of Onset   Heart disease Father    Hypertension Father    Hyperlipidemia Father    Breast cancer Neg Hx     Prior to Admission medications   Medication Sig Start Date End Date Taking? Authorizing Provider  acetaminophen (TYLENOL) 325 MG tablet Take 650 mg by mouth every 6 (six) hours as needed for mild pain.     [provider]  allopurinol (ZYLOPRIM) 100 MG tablet Take 100 mg by mouth daily.    [provider]  ALPRAZolam Duanne Moron) 0.5 MG tablet Take 0.25 mg by mouth at bedtime.    [provider]  ALPRAZolam Duanne Moron) 0.5 MG tablet Take 1 tablet by mouth at bedtime as needed. 07/27/21   [provider]  amLODipine (NORVASC) 10 MG tablet TAKE 1 TABLET EVERY DAY 05/13/21   [name], MD  atorvastatin (LIPITOR) 80 MG tablet Take 1 tablet (80 mg total) by mouth daily at 6 PM. 11/22/18   [name], MD  Blood Glucose Monitoring Suppl (TRUE METRIX METER) w/Device KIT  03/18/21   [provider]  Cholecalciferol (VITAMIN D3) 1000 UNITS CAPS Take 2,000 Units by mouth daily with lunch.     [provider]  ciprofloxacin (CIPRO) 500 MG tablet Take 500 mg by mouth daily as needed. 06/15/21   [provider]  cloNIDine (CATAPRES) 0.2 MG tablet TAKE 1 TABLET TWICE DAILY 06/22/21   [name], MD  Cyanocobalamin (VITAMIN B12) 1000 MCG TBCR Take 1,000 mcg by mouth daily with lunch.    [provider]  estradiol (ESTRACE) 0.1 MG/GM vaginal cream Place 0.5g nightly for two weeks then twice a week after 09/19/21   Jaquita Folds, MD  furosemide (LASIX) 40 MG tablet  TAKE 2 TABLETS (80 MG TOTAL) IN MORNING AND 1 AND 1/2 TABLETS (60 MG TOTAL) IN EVENING. CHANGE IN DOSAGE. 08/11/21   Clegg, Amy D, NP  glucose blood (TRUE METRIX BLOOD GLUCOSE TEST) test strip Use 1-2 times daily    [provider]  hydrALAZINE (APRESOLINE) 100 MG tablet TAKE 1 TABLET THREE TIMES DAILY 12/16/20   [name], MD  lactose free nutrition (BOOST) LIQD Take 237 mLs by mouth daily. (To supplement meals)     [provider]  magnesium oxide (MAG-OX) 400 (241.3 Mg) MG tablet Take 1 tablet (400 mg total) by mouth daily. 01/30/18   [name], MD  metoprolol succinate (TOPROL-XL) 25 MG 24 hr tablet TAKE 1/2 TABLET EVERY DAY 07/19/21   [name], MD  pantoprazole (PROTONIX) 40 MG tablet Take 40 mg by mouth 2 (two) times daily.  12/05/14   [provider]  predniSONE (DELTASONE) 20 MG tablet Take 2 tablets (40 mg total) by mouth daily with breakfast. 12/13/21   Gardenia Phlegm, NP  spironolactone (ALDACTONE) 25 MG tablet TAKE 1 TABLET EVERY DAY 05/13/21   [name], MD  tadalafil, PAH, (ADCIRCA) 20 MG tablet Take 2 tablets (40 mg total) by mouth daily. 12/06/21   [name], MD  TRUE METRIX BLOOD GLUCOSE TEST test strip SMARTSIG:Via Meter 04/08/21   [provider]  vitamin C (ASCORBIC ACID) 500 MG tablet Take 500 mg by mouth daily with lunch.    [provider]  warfarin (COUMADIN) 5 MG tablet Take 1 to 1 &1/2 tablets by mouth daily as directed by the coumadin clinic 12/06/21   Lorretta Harp, MD    Physical Exam: Vitals:   12/15/21 0830 12/15/21 0845 12/15/21 0848 12/15/21 0923  BP: (!) 153/36 (!) 165/41    Pulse: 64 70    Resp: 16 19    Temp:    98.2 F (36.8 C)  TempSrc:    Oral  SpO2: 100% 99%    Weight:   57.9 kg   Height:   '4\' 11"'  (1.499 m)     Constitutional: Elderly female who appears to be fatigued but in no acute distress at this time Eyes: PERRL, lids and conjunctivae normal ENMT: Mucous  membranes are moist. Posterior pharynx clear of any exudate or lesions.  Neck: normal, supple, no masses, no thyromegaly Respiratory: Normal respiratory effort without significant wheezes or rhonchi. Cardiovascular: Regular rate with click appreciated.  Trace edema on the right lower extremity and 1+ edema on the left lower extremity.  2+ pedal pulses. No carotid bruits.  Abdomen: no tenderness, no masses palpated. Bowel sounds positive.  Musculoskeletal: no clubbing / cyanosis. No joint deformity upper and lower extremities.  Skin: no rashes, lesions, ulcers. No induration Neurologic: CN 2-12 grossly intact. Strength 4+/5 in all 4.  Psychiatric: Normal judgment and insight. Alert and oriented x 3. Normal mood.   Data Reviewed:  Sinus rhythm at 89 bpm with ST-T wave changes appreciated.  Assessment and Plan: * Symptomatic anemia Patient presents with complaints of weakness found to have hemoglobin 6.8 g/dL improved from prior 2 days ago of 6.2 g/dL when she had received Epoetin.  Case was discussed with Dr. Burr Medico who recommended transfusing patient 1 unit today and then second unit tomorrow morning.  She received the first unit while in the ED. -Admit to a medical telemetry bed -Order placed to transfuse second unit of packed red blood cells tomorrow morning -PT to eval and treat  Elevated troponin Acute.  Patient reports having complaints of pounding chest pain whenever up and moving.    However high-sensitivity troponin 56->82 and have not been noted to be elevated prior.  EKG noted some ST wave changes. Suspect symptoms are secondary to patient's anemia. -Discussed with Dr. Harriet Masson without symptoms likely secondary to demand in setting of patient's anemia.  Can consider checking echocardiogram after second unit of blood transfused tomorrow if patient's symptoms persist.  Supratherapeutic INR Patient has mechanical aortic valve.  She is on Coumadin and INR elevated at 3.7. -Goal INR  2-3 -Coumadin per pharmacy  Abnormal chest x-ray Patient does not report any complaints of fever or cough.  Chest x-ray noted left lower lobe basilar atelectasis. -Add on procalcitonin -Incentive spirometry  CKD (chronic kidney disease), stage IV ([place]) On admission creatinine 2.78 which appears similar to prior.  Vasculitis, ANCA positive Patient is followed in the outpatient setting by hematology oncology and had just received a infusion of Rituximab and epoetin on 3/7. -Continue outpatient follow-up with hematology oncology  Moderate to severe pulmonary hypertension ([place]) - Continue tadalafil  Hypercholesteremia - Continue atorvastatin  Hypertension Home blood pressure regimen includes amlodipine 10 mg daily, clonidine 0.2 mg twice daily, furosemide 80 mg daily, hydralazine 100 mg 3 times daily, spironolactone 25 mg daily, metoprolol succinate 12.5 mg daily, and tadalafil 40 mg daily -Continue home regimen as tolerated      Advance Care Planning:   Code Status: Full Code   Consults: None  Family Communication: Son updated at bedside  Severity of Illness: The appropriate patient status for this patient is OBSERVATION. Observation status is judged to be reasonable and necessary in order to provide the required intensity of service to ensure the patient's safety. [name] presenting symptoms, physical exam findings, and initial radiographic and laboratory data in the context of their medical condition is felt to place them at decreased risk for further clinical deterioration. Furthermore, it is anticipated that the patient will be medically stable for discharge from the hospital within 2 midnights of admission.   Author: Norval Morton, MD 12/15/2021 9:52 AM  For on call review [email or web address].

## 2021-12-15 NOTE — Assessment & Plan Note (Signed)
On admission creatinine 2.78 which appears similar to prior. ?

## 2021-12-15 NOTE — ED Notes (Signed)
Pt transported to XRAY °

## 2021-12-15 NOTE — Assessment & Plan Note (Signed)
Patient is followed in the outpatient setting by hematology oncology and had just received a infusion of Rituximab and epoetin on 3/7. ?-Continue outpatient follow-up with hematology oncology ?

## 2021-12-15 NOTE — ED Notes (Signed)
Blood consent obtained electronically at this time ?

## 2021-12-16 DIAGNOSIS — D649 Anemia, unspecified: Secondary | ICD-10-CM | POA: Diagnosis not present

## 2021-12-16 LAB — CBC
HCT: 22.9 % — ABNORMAL LOW (ref 36.0–46.0)
Hemoglobin: 7.4 g/dL — ABNORMAL LOW (ref 12.0–15.0)
MCH: 31.2 pg (ref 26.0–34.0)
MCHC: 32.3 g/dL (ref 30.0–36.0)
MCV: 96.6 fL (ref 80.0–100.0)
Platelets: 164 10*3/uL (ref 150–400)
RBC: 2.37 MIL/uL — ABNORMAL LOW (ref 3.87–5.11)
RDW: 17.4 % — ABNORMAL HIGH (ref 11.5–15.5)
WBC: 8.9 10*3/uL (ref 4.0–10.5)
nRBC: 1.9 % — ABNORMAL HIGH (ref 0.0–0.2)

## 2021-12-16 LAB — BASIC METABOLIC PANEL
Anion gap: 9 (ref 5–15)
BUN: 110 mg/dL — ABNORMAL HIGH (ref 8–23)
CO2: 16 mmol/L — ABNORMAL LOW (ref 22–32)
Calcium: 8.9 mg/dL (ref 8.9–10.3)
Chloride: 108 mmol/L (ref 98–111)
Creatinine, Ser: 2.95 mg/dL — ABNORMAL HIGH (ref 0.44–1.00)
GFR, Estimated: 15 mL/min — ABNORMAL LOW (ref >60)
Glucose, Bld: 258 mg/dL — ABNORMAL HIGH (ref 70–99)
Potassium: 4.9 mmol/L (ref 3.5–5.1)
Sodium: 133 mmol/L — ABNORMAL LOW (ref 135–145)

## 2021-12-16 LAB — PROTIME-INR
INR: 3.4 — ABNORMAL HIGH (ref 0.8–1.2)
Prothrombin Time: 34 seconds — ABNORMAL HIGH (ref 11.4–15.2)

## 2021-12-16 MED ORDER — FOLIC ACID 1 MG PO TABS: 1.0000 mg | ORAL_TABLET | Freq: Every day | ORAL | 2 refills | Status: AC

## 2021-12-16 NOTE — Discharge Summary (Signed)
Physician Discharge Summary  [name]:[id number] DOB: [birth year] DOA: 12/15/2021  PCP: [name], MD  Admit date: 12/15/2021 Discharge date: 12/16/2021  Admitted From: Home Discharge disposition: Home with home health  Recommendations at discharge:  Follow-up with oncology as an outpatient  Brief narrative: [name] is a 81 y.o. female with PMH significant for DM2, HTN, HLD, CAD/CABG 2005, AS s/p mechanical valve 2005, PAD with stent on aspirin, carotid artery disease, A-fib on chronic anticoagulation, moderate to severe pulmonary hypertension, chronic bilateral lymphedema, ANCA vasculitis on rituximab, chronic anemia who lives at home, usually gets around with a cane.  Patient presented to the ED on 3/9 with complaint of generalized weakness for 3 to 4 days worse with exertion, stated with palpitation, lightheadedness, chest pain.  In the ED, patient was afebrile, blood pressure elevated 175/45, breathing on room air Labs with hemoglobin low at 6.8, sodium at 134, serum bicarbonate 17, BUN/creatinine 113/2.75, INR 3.7, troponin 56->82.   Chest x-ray noted patchy left basilar atelectasis.   Influenza and COVID-19 screening were negative.  She was ordered for 1 unit of PRBC transfusion Admitted to hospitalist service.  Subjective:  Patient was seen and examined this morning.  Pleasant elderly Caucasian female.  Lying on bed.  Not in distress.  No new symptoms.  Received 2 units PRBC since admission.  Principal Problem:   Symptomatic anemia Active Problems:   Elevated troponin   Hypertension   Hypercholesteremia   Mechanical heart valve present   Moderate to severe pulmonary hypertension (HCC)   Vasculitis, ANCA positive   CKD (chronic kidney disease), stage IV (HCC)   Abnormal chest x-ray   Supratherapeutic INR    Assessment and Plan: Acute on chronic anemia -Presented with progressive weakness.  Hemoglobin 6.8.  No blood loss.   -Follows up with Dr.  Burr Medico.  Last received a Epoetin 2 days prior to presentation  -Oncology consultation appreciated.  2 units PRBCs given.  -Continue to follow-up with oncology as an outpatient  Generalized weakness -PT evaluation obtained.  Home health PT recommended..  Elevated troponin CAD/CABG 2005 HLD -Troponin elevated as high as 82.  EKG noted some ST wave changes.  Patient endorsed chest pain and shortness of breath but her symptoms are most likely due to anemia. -Her symptoms have resolved today. -Patient is already on aspirin and Coumadin and statin.  Supratherapeutic INR AS s/p MVR 2005 -Coumadin at presentation was elevated to 3.7.  Target INR 2.5-3.5 because of mechanical valve.  INR back in therapeutic range at 3.4 today.  Abnormal chest x-ray -Patient does not report any complaints of fever or cough.  Chest x-ray noted left lower lobe basilar atelectasis. -Incentive spirometry and breathing exercise.  CKD (chronic kidney disease), stage IV -Creatinine at baseline less than 3. Recent Labs    03/01/21 1423 03/14/21 0933 05/24/21 1414 12/13/21 1202 12/15/21 0820 12/16/21 0810  BUN 87* 83* 84* 107* 113* 110*  CREATININE 2.19* 2.18* 2.50* 2.78* 2.75* 2.95*   Vasculitis, ANCA positive -Patient is followed in the outpatient setting by hematology oncology and had just received a infusion of Rituximab and epoetin on 3/7. -Continue outpatient follow-up with hematology oncology  Essential hypertension Pulmonary hypertension -Home blood pressure regimen includes amlodipine 10 mg daily, clonidine 0.2 mg twice daily, furosemide 80 mg daily, hydralazine 100 mg 3 times daily, spironolactone 25 mg daily, metoprolol succinate 12.5 mg daily, and tadalafil 40 mg daily -Continue home regimen -Follow-up with cardiology as an outpatient.  Goals of care   Code Status: Full Code    Mobility: PT eval pending  Nutritional status:  Body mass index is 25.47 kg/m.          Diet:  Diet Order              Diet general           Diet Heart Room service appropriate? Yes; Fluid consistency: Thin  Diet effective now                     Wounds:  -    Discharge Exam:   Vitals:   12/16/21 1143 12/16/21 1202 12/16/21 1210 12/16/21 1451  BP: (!) 168/43 (!) 156/40 (!) 167/39 (!) 170/48  Pulse: 62 60 64 64  Resp: _0 Temp: 97.7 F (36.5 C) 97.9 F (36.6 C) 97.9 F (36.6 C) 97.9 F (36.6 C)  TempSrc: Oral Oral Oral Oral  SpO2: 100% 100% 100% 99%  Weight:      Height:        Body mass index is 25.47 kg/m.  General exam: Pleasant, elderly Caucasian female not in physical distress Skin: No rashes, lesions or ulcers. HEENT: Atraumatic, normocephalic, no obvious bleeding Lungs: Clear to auscultation bilaterally CVS: Regular rate and rhythm, no murmur GI/Abd soft, nontender, nondistended, bowels are present CNS: Alert, awake, oriented x3 Psychiatry: Mood appropriate Extremities: No pedal edema, no calf tenderness  Follow ups:    Follow-up Information     [name], [name], MD Follow up.   Specialty: Internal Medicine Contact information: [place] [address] [phone]         [name], MD .   Specialty: Cardiology Contact information: [place] [address] [phone]         Lorretta Harp, MD .   Specialties: Cardiology, Radiology Contact information: [address] [place] [address] [place], [place] Follow up.   Specialty: Home Health Services Contact information: [place] [address] [phone]                 Discharge Instructions:   Discharge Instructions     Call MD for:  difficulty breathing, headache or visual disturbances   Complete by: As directed    Call MD for:  extreme fatigue   Complete by: As directed    Call MD for:  hives   Complete by: As directed    Call MD for:   persistant dizziness or light-headedness   Complete by: As directed    Call MD for:  persistant nausea and vomiting   Complete by: As directed    Call MD for:  severe uncontrolled pain   Complete by: As directed    Call MD for:  temperature >100.4   Complete by: As directed    Diet general   Complete by: As directed    Discharge instructions   Complete by: As directed    General discharge instructions: Follow with Primary MD [name], [name], MD in 7 days  Please request your PCP  to go over your hospital tests, procedures, radiology results at the follow up. Please get your medicines reviewed and adjusted.  Your PCP may decide to repeat certain labs or tests as needed. Do not drive, operate heavy machinery, perform activities at heights, swimming or participation in water activities or provide  baby sitting services if your were admitted for syncope or siezures until you have seen by Primary MD or a Neurologist and advised to do so again. [place] Controlled Substance Reporting System database was reviewed. Do not drive, operate heavy machinery, perform activities at heights, swim, participate in water activities or provide baby-sitting services while on medications for pain, sleep and mood until your outpatient physician has reevaluated you and advised to do so again.  You are strongly recommended to comply with the dose, frequency and duration of prescribed medications. Activity: As tolerated with Full fall precautions use walker/cane & assistance as needed Avoid using any recreational substances like cigarette, tobacco, alcohol, or non-prescribed drug. If you experience worsening of your admission symptoms, develop shortness of breath, life threatening emergency, suicidal or homicidal thoughts you must seek medical attention immediately by calling 911 or calling your MD immediately  if symptoms less severe. You must read complete instructions/literature along with all the possible  adverse reactions/side effects for all the medicines you take and that have been prescribed to you. Take any new medicine only after you have completely understood and accepted all the possible adverse reactions/side effects.  Wear Seat belts while driving. You were cared for by a hospitalist during your hospital stay. If you have any questions about your discharge medications or the care you received while you were in the hospital after you are discharged, you can call the unit and ask to speak with the hospitalist or the covering physician. Once you are discharged, your primary care physician will handle any further medical issues. Please note that NO REFILLS for any discharge medications will be authorized once you are discharged, as it is imperative that you return to your primary care physician (or establish a relationship with a primary care physician if you do not have one).   Increase activity slowly   Complete by: As directed        Discharge Medications:   Allergies as of 12/16/2021       Reactions   Flagyl [metronidazole] Rash   ALL-OVER BODY RASH   Ciprofloxacin    Other reaction(s): Stomach upset - tolerates Other reaction(s): Stomach upset - tolerates   Coreg [carvedilol] Other (See Comments)   Terrible cramping in the feet and had a lot of bowel movements, but not diarrhea   Losartan Swelling   Patient doesn't recall site of swelling   Sulfamethoxazole-trimethoprim    Other reaction(s): stomach upset .   Verapamil Hives   Zetia [ezetimibe] Other (See Comments)   Reaction not recalled   Zocor [simvastatin - High Dose] Other (See Comments)   Reaction not recalled   Gatifloxacin Rash   Redness to skin around eye        Medication List     TAKE these medications    acetaminophen 325 MG tablet Commonly known as: TYLENOL Take 650 mg by mouth every 6 (six) hours as needed for mild pain.   alendronate 70 MG tablet Commonly known as: FOSAMAX Take 70 mg by mouth  once a week. saturdays   allopurinol 100 MG tablet Commonly known as: ZYLOPRIM Take 100 mg by mouth daily.   ALPRAZolam 0.5 MG tablet Commonly known as: XANAX Take 1 tablet by mouth at bedtime as needed.   amLODipine 10 MG tablet Commonly known as: NORVASC TAKE 1 TABLET EVERY DAY   atorvastatin 80 MG tablet Commonly known as: LIPITOR Take 1 tablet (80 mg total) by mouth daily at 6 PM.   ciprofloxacin  500 MG tablet Commonly known as: CIPRO Take 500 mg by mouth daily as needed.   cloNIDine 0.2 MG tablet Commonly known as: CATAPRES TAKE 1 TABLET TWICE DAILY   estradiol 0.1 MG/GM vaginal cream Commonly known as: ESTRACE Place 0.5g nightly for two weeks then twice a week after What changed:  how much to take how to take this when to take this   folic acid 1 MG tablet Commonly known as: FOLVITE Take 1 tablet (1 mg total) by mouth daily. Start taking on: December 17, 2021   furosemide 40 MG tablet Commonly known as: LASIX TAKE 2 TABLETS (80 MG TOTAL) IN MORNING AND 1 AND 1/2 TABLETS (60 MG TOTAL) IN EVENING. CHANGE IN DOSAGE. What changed: See the new instructions.   hydrALAZINE 100 MG tablet Commonly known as: APRESOLINE TAKE 1 TABLET THREE TIMES DAILY   lactose free nutrition Liqd Take 237 mLs by mouth daily. (To supplement meals)   magnesium oxide 400 (241.3 Mg) MG tablet Commonly known as: MAG-OX Take 1 tablet (400 mg total) by mouth daily.   metoprolol succinate 25 MG 24 hr tablet Commonly known as: TOPROL-XL TAKE 1/2 TABLET EVERY DAY   pantoprazole 40 MG tablet Commonly known as: PROTONIX Take 40 mg by mouth 2 (two) times daily.   predniSONE 20 MG tablet Commonly known as: DELTASONE Take 2 tablets (40 mg total) by mouth daily with breakfast.   spironolactone 25 MG tablet Commonly known as: ALDACTONE TAKE 1 TABLET EVERY DAY   tadalafil (PAH) 20 MG tablet Commonly known as: ADCIRCA Take 2 tablets (40 mg total) by mouth daily.   True Metrix Blood  Glucose Test test strip Generic drug: glucose blood Use 1-2 times daily   True Metrix Blood Glucose Test test strip Generic drug: glucose blood SMARTSIG:Via Meter   True Metrix Meter w/Device Kit   Vitamin B12 1000 MCG Tbcr Take 1,000 mcg by mouth daily with lunch.   vitamin C 500 MG tablet Commonly known as: ASCORBIC ACID Take 500 mg by mouth daily with lunch.   Vitamin D3 25 MCG (1000 UT) Caps Take 2,000 Units by mouth daily with lunch.   warfarin 5 MG tablet Commonly known as: COUMADIN Take as directed. If you are unsure how to take this medication, talk to your nurse or doctor. Original instructions: Take 1 to 1 &1/2 tablets by mouth daily as directed by the coumadin clinic What changed:  how much to take how to take this when to take this additional instructions         The results of significant diagnostics from this hospitalization (including imaging, microbiology, ancillary and laboratory) are listed below for reference.    Procedures and Diagnostic Studies:   DG Chest 2 View  Result Date: 12/15/2021 CLINICAL DATA:  Shortness of breath EXAM: CHEST - 2 VIEW COMPARISON:  07/07/2019 FINDINGS: No new consolidation. Patchy left basilar atelectasis. Few calcified granulomas. Chronic blunting of the costophrenic angles. Similar cardiomegaly. IMPRESSION: Patchy left basilar atelectasis. Electronically Signed   By: Macy Mis M.D.   On: 12/15/2021 07:45     Labs:   Basic Metabolic Panel: Recent Labs  Lab 12/13/21 1202 12/15/21 0820 12/16/21 0810  NA 133* 134* 133*  K 4.7 4.1 4.9  CL 104 105 108  CO2 19* 17* 16*  GLUCOSE 152* 142* 258*  BUN 107* 113* 110*  CREATININE 2.78* 2.75* 2.95*  CALCIUM 9.1 8.7* 8.9   GFR Estimated Creatinine Clearance: 11.3 mL/min (A) (by C-G formula based on  SCr of 2.95 mg/dL (H)). Liver Function Tests: Recent Labs  Lab 12/13/21 1202 12/15/21 0820  AST 18 60*  ALT 11 44  ALKPHOS 79 77  BILITOT 0.3 0.4  PROT 6.4* 5.5*   ALBUMIN 4.1 3.4*   No results for input(s): LIPASE, AMYLASE in the last 168 hours. No results for input(s): AMMONIA in the last 168 hours. Coagulation profile Recent Labs  Lab 12/15/21 0754 12/16/21 0158  INR 3.7* 3.4*    CBC: Recent Labs  Lab 12/13/21 1002 12/15/21 0754 12/15/21 1819 12/16/21 0810  WBC 6.3 8.9  --  8.9  NEUTROABS 4.4 6.6  --   --   HGB 6.2* 6.8* 7.9* 7.4*  HCT 19.0* 20.8* 23.4* 22.9*  MCV 95.5 97.2  --  96.6  PLT 147* 179  --  164   Cardiac Enzymes: No results for input(s): CKTOTAL, CKMB, CKMBINDEX, TROPONINI in the last 168 hours. BNP: Invalid input(s): POCBNP CBG: No results for input(s): GLUCAP in the last 168 hours. D-Dimer No results for input(s): DDIMER in the last 72 hours. Hgb A1c No results for input(s): HGBA1C in the last 72 hours. Lipid Profile No results for input(s): CHOL, HDL, LDLCALC, TRIG, CHOLHDL, LDLDIRECT in the last 72 hours. Thyroid function studies No results for input(s): TSH, T4TOTAL, T3FREE, THYROIDAB in the last 72 hours.  Invalid input(s): FREET3 Anemia work up [employer]    12/15/21 0754 12/15/21 0755  FERRITIN 213  --   RETICCTPCT  --  5.7*   Microbiology Recent Results (from the past 240 hour(s))  Resp Panel by RT-PCR (Flu A&B, Covid) Nasopharyngeal Swab     Status: None   Collection Time: 12/15/21  9:27 AM   Specimen: Nasopharyngeal Swab; Nasopharyngeal(NP) swabs in vial transport medium  Result Value Ref Range Status   SARS Coronavirus 2 by RT PCR NEGATIVE NEGATIVE Final    Comment: (NOTE) SARS-CoV-2 target nucleic acids are NOT DETECTED.  The SARS-CoV-2 RNA is generally detectable in upper respiratory specimens during the acute phase of infection. The lowest concentration of SARS-CoV-2 viral copies this assay can detect is 138 copies/mL. A negative result does not preclude SARS-Cov-2 infection and should not be used as the sole basis for treatment or other patient management decisions. A negative  result may occur with  improper specimen collection/handling, submission of specimen other than nasopharyngeal swab, presence of viral mutation(s) within the areas targeted by this assay, and inadequate number of viral copies(<138 copies/mL). A negative result must be combined with clinical observations, patient history, and epidemiological information. The expected result is Negative.  Fact Sheet for Patients:  EntrepreneurPulse.com.au  Fact Sheet for Healthcare Providers:  IncredibleEmployment.be  This test is no t yet approved or cleared by the [place] FDA and  has been authorized for detection and/or diagnosis of SARS-CoV-2 by FDA under an Emergency Use Authorization (EUA). This EUA will remain  in effect (meaning this test can be used) for the duration of the COVID-19 declaration under Section 564(b)(1) of the Act, 21 U.S.C.section 360bbb-3(b)(1), unless the authorization is terminated  or revoked sooner.       Influenza A by PCR NEGATIVE NEGATIVE Final   Influenza B by PCR NEGATIVE NEGATIVE Final    Comment: (NOTE) The Xpert Xpress SARS-CoV-2/FLU/RSV plus assay is intended as an aid in the diagnosis of influenza from Nasopharyngeal swab specimens and should not be used as a sole basis for treatment. Nasal washings and aspirates are unacceptable for Xpert Xpress SARS-CoV-2/FLU/RSV testing.  Fact Sheet for  Patients: EntrepreneurPulse.com.au  Fact Sheet for Healthcare Providers: IncredibleEmployment.be  This test is not yet approved or cleared by the [place] FDA and has been authorized for detection and/or diagnosis of SARS-CoV-2 by FDA under an Emergency Use Authorization (EUA). This EUA will remain in effect (meaning this test can be used) for the duration of the COVID-19 declaration under Section 564(b)(1) of the Act, 21 U.S.C. section 360bbb-3(b)(1), unless the authorization is  terminated or revoked.  Performed at [facility], [place] [address], [place], [place] [address]     Time coordinating discharge: 35 minutes  Signed: Binaya Dahal  [facility] 12/16/2021, 5:45 PM

## 2021-12-16 NOTE — Progress Notes (Signed)
?PROGRESS NOTE ? ?[name]  ?DOB: 1941-08-03  ?PCP: [name], MD ?WYO:[id number]  ?DOA: 12/15/2021 ? LOS: 0 days  ?Hospital Day: 2 ? ?Brief narrative: ?[name] is a 81 y.o. female with PMH significant for DM2, HTN, HLD, CAD/CABG 2005, AS s/p mechanical valve 2005, PAD with stent on aspirin, carotid artery disease, A-fib on chronic anticoagulation, moderate to severe pulmonary hypertension, chronic bilateral lymphedema, ANCA vasculitis on rituximab, chronic anemia who lives at home, usually gets around with a cane. ? ?Patient presented to the ED on 3/9 with complaint of generalized weakness for 3 to 4 days worse with exertion, stated with palpitation, lightheadedness, chest pain. ? ?In the ED, patient was afebrile, blood pressure elevated 175/45, breathing on room air ?Labs with hemoglobin low at 6.8, sodium at 134, serum bicarbonate 17, BUN/creatinine 113/2.75, INR 3.7, troponin 56->82.   ?Chest x-ray noted patchy left basilar atelectasis.   ?Influenza and COVID-19 screening were negative.  ?She was ordered for 1 unit of PRBC transfusion ?Admitted to hospitalist service. ? ?Subjective:  ?Patient was seen and examined this morning.  Pleasant elderly Caucasian female.  Lying on bed.  Not in distress.  No new symptoms.  Received 1 unit of PRBC last night.  Feels better than yesterday.  No chest pain or shortness of breath. ? ? ?Principal Problem: ?  Symptomatic anemia ?Active Problems: ?  Elevated troponin ?  Hypertension ?  Hypercholesteremia ?  Mechanical heart valve present ?  Moderate to severe pulmonary hypertension (HCC) ?  Vasculitis, ANCA positive ?  CKD (chronic kidney disease), stage IV ([place]) ?  Abnormal chest x-ray ?  Supratherapeutic INR ?  ? ?Assessment and Plan: ?Acute on chronic anemia ?-Presented with progressive weakness.  Hemoglobin 6.8.  No blood loss.   ?-Follows up with Dr. Burr Medico.  Last received a Epoetin 2 days prior to presentation  ?-Oncology consultation appreciated.  Patient  was given 1 unit in the ED.  1 more unit given earlier this afternoon. ?-Continue to follow-up with oncology as an outpatient ? ?Generalized weakness ?-PT evaluation. ? ?Elevated troponin ?CAD/CABG 2005 ?HLD ?-Troponin elevated as high as 82.  EKG noted some ST wave changes.  Patient endorsed chest pain and shortness of breath but her symptoms are most likely due to anemia. ?-Her symptoms have resolved this morning. ?-Patient is already on aspirin and Coumadin and statin. ? ?Supratherapeutic INR ?AS s/p MVR 2005 ?-Coumadin at presentation was elevated to 3.7.  Target INR 2.5-3.5 because of mechanical valve.  INR back in therapeutic range at 3.4 today. ? ?Abnormal chest x-ray ?-Patient does not report any complaints of fever or cough.  Chest x-ray noted left lower lobe basilar atelectasis. ?-Incentive spirometry and breathing exercise. ? ?CKD (chronic kidney disease), stage IV ?-Creatinine at baseline less than 3. ?Recent Labs  ?  03/01/21 ?1423 03/14/21 ?0933 05/24/21 ?1414 12/13/21 ?1202 12/15/21 ?0820 12/16/21 ?0810  ?BUN 87* 83* 84* 107* 113* 110*  ?CREATININE 2.19* 2.18* 2.50* 2.78* 2.75* 2.95*  ? ?Vasculitis, ANCA positive ?-Patient is followed in the outpatient setting by hematology oncology and had just received a infusion of Rituximab and epoetin on 3/7. ?-Continue outpatient follow-up with hematology oncology ? ?Essential hypertension ?Pulmonary hypertension ?-Home blood pressure regimen includes amlodipine 10 mg daily, clonidine 0.2 mg twice daily, furosemide 80 mg daily, hydralazine 100 mg 3 times daily, spironolactone 25 mg daily, metoprolol succinate 12.5 mg daily, and tadalafil 40 mg daily ?-Continue home regimen as tolerated ?-Follow-up with cardiology as an  outpatient. ? ?Goals of care ?  Code Status: Full Code  ? ? ?Mobility: PT eval pending ? ?Nutritional status:  ?Body mass index is 25.47 kg/m?.  ?  ?  ? ? ? ? ?Diet:  ?Diet Order   ? ?       ?  Diet general       ?  ?  Diet Heart Room service  appropriate? Yes; Fluid consistency: Thin  Diet effective now       ?  ? ?  ?  ? ?  ? ? ?DVT prophylaxis: Coumadin ? ?  ?Antimicrobials: None ?Fluid: None ?Consultants: Oncology ?Family Communication: None at bedside ? ?Status is: Observation ? ?Continue in-hospital care because: Does not feel back to normal yet.  Elderly woman, lives alone.  Wants to stay till tomorrow. ?Level of care: Telemetry Medical  ? ?Dispo: The patient is from: Home ?             Anticipated d/c is to: Home likely tomorrow ?             Patient currently is not medically stable to d/c. ?  Difficult to place patient No ? ? ? ? ?Infusions:  ? sodium chloride Stopped (12/15/21 1030)  ? ? ?Scheduled Meds: ? allopurinol  100 mg Oral Daily  ? amLODipine  10 mg Oral Daily  ? aspirin EC  81 mg Oral Daily  ? atorvastatin  80 mg Oral q1800  ? cloNIDine  0.2 mg Oral BID  ? folic acid  1 mg Oral Daily  ? furosemide  80 mg Oral Daily  ? hydrALAZINE  100 mg Oral TID  ? lactose free nutrition  237 mL Oral Daily  ? magnesium oxide  400 mg Oral Daily  ? metoprolol succinate  12.5 mg Oral Daily  ? pantoprazole  40 mg Oral BID  ? sodium chloride flush  3 mL Intravenous Q12H  ? spironolactone  25 mg Oral Daily  ? tadalafil  40 mg Oral Daily  ? vitamin B-12  1,000 mcg Oral Q lunch  ? Warfarin - Pharmacist Dosing Inpatient   Does not apply q1600  ? ? ?PRN meds: ?acetaminophen **OR** acetaminophen, albuterol, ALPRAZolam, ondansetron **OR** ondansetron (ZOFRAN) IV  ? ?Antimicrobials: ?Anti-infectives (From admission, onward)  ? ? None  ? ?  ? ? ?Objective: ?Vitals:  ? 12/16/21 1210 12/16/21 1451  ?BP: (!) 167/39 (!) 170/48  ?Pulse: 64 64  ?Resp: 18 18  ?Temp: 97.9 ?F (36.6 ?C) 97.9 ?F (36.6 ?C)  ?SpO2: 100% 99%  ? ? ?Intake/Output Summary (Last 24 hours) at 12/16/2021 1616 ?Last data filed at 12/16/2021 1451 ?Gross per 24 hour  ?Intake 310 ml  ?Output --  ?Net 310 ml  ? ?Filed Weights  ? 12/15/21 0848 12/16/21 0333  ?Weight: 57.9 kg 57.2 kg  ? ?Weight change:  ?Body  mass index is 25.47 kg/m?.  ? ?Physical Exam: ?General exam: Pleasant, elderly Caucasian female not in physical distress ?Skin: No rashes, lesions or ulcers. ?HEENT: Atraumatic, normocephalic, no obvious bleeding ?Lungs: Clear to auscultation bilaterally ?CVS: Regular rate and rhythm, no murmur ?GI/Abd soft, nontender, nondistended, bowels are present ?CNS: Alert, awake, oriented x3 ?Psychiatry: Mood appropriate ?Extremities: No pedal edema, no calf tenderness ? ?Data Review: I have personally reviewed the laboratory data and studies available. ? ?F/u labs ordered ?Unresulted Labs (From admission, onward)  ? ?  Start     Ordered  ? 12/16/21 0500  Protime-INR  Daily,   R     ?  12/15/21 1401  ? ?  ?  ? ?  ? ? ?Signed, ?Terrilee Croak, MD ?[facility] ?12/16/2021 ? ? ? ? ? ? ? ? ? ? ?  ?

## 2021-12-16 NOTE — Progress Notes (Signed)
ANTICOAGULATION CONSULT NOTE ? ?Pharmacy Consult:  Coumadin ?Indication:  mechanical AVR ? ?Allergies  ?Allergen Reactions  ? Flagyl [Metronidazole] Rash  ?  ALL-OVER BODY RASH  ? Ciprofloxacin   ?  Other reaction(s): Stomach upset - tolerates ?Other reaction(s): Stomach upset - tolerates  ? Coreg [Carvedilol] Other (See Comments)  ?  Terrible cramping in the feet and had a lot of bowel movements, but not diarrhea  ? Losartan Swelling  ?  Patient doesn't recall site of swelling  ? Sulfamethoxazole-Trimethoprim   ?  Other reaction(s): stomach upset .  ? Verapamil Hives  ? Zetia [Ezetimibe] Other (See Comments)  ?  Reaction not recalled  ? Zocor [Simvastatin - High Dose] Other (See Comments)  ?  Reaction not recalled  ? Gatifloxacin Rash  ?  Redness to skin around eye  ? ? ?Patient Measurements: ?Height: '4\' 11"'$  (149.9 cm) ?Weight: 57.2 kg (126 lb 1.7 oz) ?IBW/kg (Calculated) : 43.2 ? ?Vital Signs: ?Temp: 97.5 ?F (36.4 ?C) (03/10 3151) ?Temp Source: Oral (03/10 7616) ?BP: 162/46 (03/10 0742) ?Pulse Rate: 66 (03/10 0742) ? ?Labs: ?Recent Labs  ?  12/13/21 ?1002 12/13/21 ?1202 12/15/21 ?0754 12/15/21 ?0820 12/15/21 ?0931 12/15/21 ?1819 12/16/21 ?0158  ?HGB 6.2*  --  6.8*  --   --  7.9*  --   ?HCT 19.0*  --  20.8*  --   --  23.4*  --   ?PLT 147*  --  179  --   --   --   --   ?LABPROT  --   --  36.4*  --   --   --  34.0*  ?INR  --   --  3.7*  --   --   --  3.4*  ?CREATININE  --  2.78*  --  2.75*  --   --   --   ?TROPONINIHS  --   --   --  56* 82*  --   --   ? ? ? ?Estimated Creatinine Clearance: 12.2 mL/min (A) (by C-G formula based on SCr of 2.75 mg/dL (H)). ? ?Assessment: ?81 yo F admitted for symptomatic anemia and received transfusion.  Patient is on Coumadin '5mg'$  MWF and 7.'5mg'$  the rest of the week at home for mechanical AVR; last dose on 12/14/21.  Pharmacy consulted to dose Coumadin. ? ?INR remains elevated but has trended down to 3.4; no bleeding reported. ? ?Goal of Therapy:  ?INR 2-3 ?Monitor platelets by  anticoagulation protocol: Yes ?  ?Plan:  ?Continue to hold Coumadin today ?Daily PT / INR  ? ?Ryanne Morand D. Mina Marble, PharmD, BCPS, BCCCP ?12/16/2021, 7:50 AM ? ? ?

## 2021-12-16 NOTE — Progress Notes (Signed)
Pt discharged home with family in stable condition ?

## 2021-12-16 NOTE — Progress Notes (Signed)
PT Cancellation Note ? ?Patient Details ?Name: [name] ?MRN: [id number] ?DOB: 23-Aug-1941 ? ? ?Cancelled Treatment:    Reason Eval/Treat Not Completed: Medical issues which prohibited therapy.  Still receiving PRBC's so will retry at a later time.   ? ? ?Ramond Dial ?12/16/2021, 2:03 PM ? ?Mee Hives, PT PhD ?Acute Rehab Dept. Number: [facility] [phone] and [facility] [phone] ? ?

## 2021-12-16 NOTE — Evaluation (Signed)
Physical Therapy Evaluation ?Patient Details ?Name: [name] ?MRN: [id number] ?DOB: 1941-06-07 ?Today's Date: 12/16/2021 ? ?History of Present Illness ? 81 yo female with onset of pounding in chest was admitted 3/9, SOB with effort and chestpain.  Upgraded to walker to move, anemic and transfusion scheduled.  Note elevated troponin with demand ischemia, supratherapeutic INR and moderate to severe pulm HTN.  PMHx:  HTN, HLD, pancytopenia, CAD, CABG 2005, mechanical aortic valve, DM, ANCA vasculitis  ?Clinical Impression ? Pt was seen for mobility evaluation and found her to be generally a bit weak but able to walk with min assist to min guard.  Her family is in attendance for the visit, and they plan to be helpful for her to get home.  Pt is feeling weak and asked to stay another day, which is granted.  Will take her on the stairs in the AM if she is feeling able to try, and will anticipate HHPT to be needed for follow up with strengthening and balance work.  Pt is motivated to go home and reporting her prior level of mobility to be quite independent.  Follow up for acute PT goals as are outlined below.   ?   ? ?Recommendations for follow up therapy are one component of a multi-disciplinary discharge planning process, led by the attending physician.  Recommendations may be updated based on patient status, additional functional criteria and insurance authorization. ? ?Follow Up Recommendations Home health PT ? ?  ?Assistance Recommended at Discharge Intermittent Supervision/Assistance  ?Patient can return home with the following ? A little help with walking and/or transfers;A little help with bathing/dressing/bathroom;Assistance with cooking/housework;Assist for transportation;Help with stairs or ramp for entrance ? ?  ?Equipment Recommendations None recommended by PT  ?Recommendations for Other Services ?    ?  ?Functional Status Assessment Patient has had a recent decline in their functional status and  demonstrates the ability to make significant improvements in function in a reasonable and predictable amount of time.  ? ?  ?Precautions / Restrictions Precautions ?Precautions: Fall ?Precaution Comments: monitor for sats and HR ?Restrictions ?Weight Bearing Restrictions: No  ? ?  ? ?Mobility ? Bed Mobility ?Overal bed mobility: Needs Assistance ?Bed Mobility: Sidelying to Sit, Rolling ?Rolling: Min assist ?Sidelying to sit: Min assist ?  ?  ?  ?General bed mobility comments: min assist to support trunk and give her light forward support ?  ? ?Transfers ?Overall transfer level: Needs assistance ?Equipment used: Rolling walker (2 wheels), 1 person hand held assist ?Transfers: Bed to chair/wheelchair/BSC, Sit to/from Stand ?Sit to Stand: Min assist ?  ?Step pivot transfers: Min guard ?  ?  ?  ?General transfer comment: pt is fairly stable on walker, requiring more help to decide direction to turn than to just give WB support ?  ? ?Ambulation/Gait ?Ambulation/Gait assistance: Min guard, Min assist ?Gait Distance (Feet): 35 Feet ?Assistive device: Rolling walker (2 wheels), 1 person hand held assist ?Gait Pattern/deviations: Step-through pattern, Decreased stride length, Wide base of support, Trunk flexed (minor trunk flexion) ?Gait velocity: reduced ?Gait velocity interpretation: <1.31 ft/sec, indicative of household ambulator ?Pre-gait activities: standing balance correction and postural cues ?General Gait Details: Pt is up to walk with help minimally to support the turning and direction of walker and to cue safety with backing up to chair ? ?Stairs ?  ?  ?  ?  ?  ? ?Wheelchair Mobility ?  ? ?Modified Rankin (Stroke Patients Only) ?  ? ?  ? ?Balance  Overall balance assessment: Needs assistance ?Sitting-balance support: Feet supported ?Sitting balance-Leahy Scale: Fair ?  ?  ?Standing balance support: Bilateral upper extremity supported, During functional activity ?Standing balance-Leahy Scale: Poor ?  ?  ?  ?  ?  ?  ?   ?  ?  ?  ?  ?  ?   ? ? ? ?Pertinent Vitals/Pain Pain Assessment ?Pain Assessment: No/denies pain  ? ? ?Home Living Family/patient expects to be discharged to:: Private residence ?Living Arrangements: Alone ?Available Help at Discharge: Family;Available 24 hours/day ?Type of Home: House ?Home Access: Stairs to enter ?Entrance Stairs-Rails: Right;Left ?Entrance Stairs-Number of Steps: 3 ?  ?Home Layout: One level ?Home Equipment: Rollator (4 wheels);Cane - single point;Shower seat;BSC/3in1;Grab bars - tub/shower;Wheelchair - manual ?Additional Comments: has been home on [facility] until this admission  ?  ?Prior Function Prior Level of Function : Independent/Modified Independent ?  ?  ?  ?  ?  ?  ?Mobility Comments: has been able to walk and handle her home and self care ?  ?  ? ? ?Hand Dominance  ? Dominant Hand: Right ? ?  ?Extremity/Trunk Assessment  ? Upper Extremity Assessment ?Upper Extremity Assessment: Overall WFL for tasks assessed ?  ? ?Lower Extremity Assessment ?Lower Extremity Assessment: Generalized weakness ?  ? ?Cervical / Trunk Assessment ?Cervical / Trunk Assessment: Kyphotic (mild)  ?Communication  ? Communication: No difficulties  ?Cognition Arousal/Alertness: Awake/alert ?Behavior During Therapy: [facility] for tasks assessed/performed ?Overall Cognitive Status: Within Functional Limits for tasks assessed ?  ?  ?  ?  ?  ?  ?  ?  ?  ?  ?  ?  ?  ?  ?  ?  ?General Comments: pt is able to give accurate history per family validation ?  ?  ? ?  ?General Comments General comments (skin integrity, edema, etc.): pt is up to walk with help and to get set up in chair.  Pt is comfortable, able to assist moving with minor strength loss in LE's.  Her family will assist upon return home and will anticipate dc in next day to finish some mobility training ? ?  ?Exercises    ? ?Assessment/Plan  ?  ?PT Assessment Patient needs continued PT services  ?PT Problem List Decreased strength;Decreased activity tolerance;Decreased  balance;Decreased mobility;Decreased knowledge of use of DME;Decreased safety awareness;Other (comment) (family education) ? ?   ?  ?PT Treatment Interventions DME instruction;Gait training;Stair training;Functional mobility training;Therapeutic activities;Therapeutic exercise;Balance training;Neuromuscular re-education;Patient/family education   ? ?PT Goals (Current goals can be found in the Care Plan section)  ?Acute Rehab PT Goals ?Patient Stated Goal: to walk and go home ?PT Goal Formulation: With patient/family ?Time For Goal Achievement: 12/30/21 ?Potential to Achieve Goals: Good ? ?  ?Frequency Min 3X/week ?  ? ? ?Co-evaluation   ?  ?  ?  ?  ? ? ?  ?AM-PAC PT "6 Clicks" Mobility  ?Outcome Measure Help needed turning from your back to your side while in a flat bed without using bedrails?: A Little ?Help needed moving from lying on your back to sitting on the side of a flat bed without using bedrails?: A Little ?Help needed moving to and from a bed to a chair (including a wheelchair)?: A Little ?Help needed standing up from a chair using your arms (e.g., wheelchair or bedside chair)?: A Little ?Help needed to walk in hospital room?: A Little ?Help needed climbing 3-5 steps with a railing? : Total ?6 Click Score: 16 ? ?  ?  End of Session Equipment Utilized During Treatment: Gait belt ?Activity Tolerance: Patient limited by fatigue;Treatment limited secondary to medical complications (Comment) ?Patient left: in chair;with call bell/phone within reach;with chair alarm set;with family/visitor present ?Nurse Communication: Mobility status ?PT Visit Diagnosis: Unsteadiness on feet (R26.81);Muscle weakness (generalized) (M62.81);Difficulty in walking, not elsewhere classified (R26.2) ?  ? ?Time: 4496-7591 ?PT Time Calculation (min) (ACUTE ONLY): 28 min ? ? ?Charges:   PT Evaluation ?$PT Eval Moderate Complexity: 1 Mod ?PT Treatments ?$Gait Training: 8-22 mins ?  ?   ? ?Ramond Dial ?12/16/2021, 4:36 PM ? ?Mee Hives, PT  PhD ?Acute Rehab Dept. Number: [facility] [phone] and [place] [phone] ? ? ?

## 2021-12-16 NOTE — TOC Initial Note (Addendum)
Transition of Care (TOC) - Initial/Assessment Note  ? ? ?Patient Details  ?Name: [name] ?MRN: [id number] ?Date of Birth: 01/29/1941 ? ?Transition of Care (TOC) CM/SW Contact:    ?Marilu Favre, RN ?Phone Number: ?12/16/2021, 4:34 PM ? ?Clinical Narrative:                 ?Discussed recommendation for HHPT with patient and daughters at bedside. Patient wants same agency she had in 2019 , either [facility] or [facility]. NCM has [place] with [facility] and [place] with [facility].  ? ?Stacie with [facility] confirmed they have had patient in past and has accepted referral. Patient and daughters aware  ? ?Expected Discharge Plan: [place] ?Barriers to Discharge: Continued Medical Work up ? ? ?Patient Goals and CMS Choice ?Patient states their goals for this hospitalization and ongoing recovery are:: to return to home ?CMS Medicare.gov Compare Post Acute Care list provided to:: Patient ?Choice offered to / list presented to : Patient ? ?Expected Discharge Plan and Services ?Expected Discharge Plan: [place] ?  ?Discharge Planning Services: CM Consult ?Post Acute Care Choice: Home Health ?Living arrangements for the past 2 months: [place] ?Expected Discharge Date: 12/16/21               ?DME Arranged: N/A ?  ?  ?  ?  ?HH Arranged: PT ?  ?  ?  ?  ? ?Prior Living Arrangements/Services ?Living arrangements for the past 2 months: [place] ?  ?Patient language and need for interpreter reviewed:: Yes ?Do you feel safe going back to the place where you live?: Yes      ?Need for Family Participation in Patient Care: Yes (Comment) ?Care giver support system in place?: Yes (comment) ?Current home services: DME ?Criminal Activity/Legal Involvement Pertinent to Current Situation/Hospitalization: No - Comment as needed ? ?Activities of Daily Living ?Home Assistive Devices/Equipment: Cane (specify quad or straight) ?ADL Screening (condition at time of  admission) ?Patient's cognitive ability adequate to safely complete daily activities?: Yes ?Is the patient deaf or have difficulty hearing?: No ?Does the patient have difficulty seeing, even when wearing glasses/contacts?: No ?Does the patient have difficulty concentrating, remembering, or making decisions?: No ?Patient able to express need for assistance with ADLs?: Yes ?Does the patient have difficulty dressing or bathing?: No ?Independently performs ADLs?: Yes (appropriate for developmental age) ?Does the patient have difficulty walking or climbing stairs?: Yes ?Weakness of Legs: Both ?Weakness of Arms/Hands: None ? ?Permission Sought/Granted ?  ?Permission granted to share information with : Yes, Verbal Permission Granted ? Share Information with NAME: daughters [name] and Helene Kelp ?   ?   ?   ? ?Emotional Assessment ?Appearance:: Appears stated age ?Attitude/Demeanor/Rapport: Engaged ?Affect (typically observed): Accepting ?Orientation: : Oriented to Self, Oriented to Place, Oriented to Situation, Oriented to  Time ?Alcohol / Substance Use: Not Applicable ?Psych Involvement: No (comment) ? ?Admission diagnosis:  Symptomatic anemia [D64.9] ?Patient Active Problem List  ? Diagnosis Date Noted  ? Symptomatic anemia 12/15/2021  ? Abnormal chest x-ray 12/15/2021  ? Supratherapeutic INR 12/15/2021  ? Elevated troponin 12/15/2021  ? Unilateral primary osteoarthritis, right hip 05/31/2021  ? Midline cystocele 05/30/2021  ? Primary localized osteoarthritis of pelvic region and thigh 05/30/2021  ? Vaginal pain 05/30/2021  ? Asymmetric SNHL (sensorineural hearing loss) 01/06/2021  ? Referred otalgia of both ears 01/06/2021  ? Bilateral hearing loss 08/31/2020  ? Bilateral impacted cerumen 08/31/2020  ? Low  back pain 02/20/2020  ? CKD (chronic kidney disease), stage IV ([place]) 10/13/2019  ? Leukopenia due to antineoplastic chemotherapy ([place]) 08/18/2019  ? Chemotherapy induced neutropenia ([place]) 07/21/2019  ? Thrombophilia ([place])  06/19/2019  ? Encounter for general adult medical examination without abnormal findings 06/13/2019  ? Arteritis ([place]) 06/06/2019  ? Hypertensive heart and chronic kidney disease with heart failure and stage 1 through stage 4 chronic kidney disease, or unspecified chronic kidney disease ([place]) 06/06/2019  ? Muscle weakness 06/06/2019  ? Pancytopenia ([place]) 05/28/2019  ? Dyspnea 05/28/2019  ? Vasculitis, ANCA positive 05/28/2019  ? Disorder of connective tissue ([place]) 05/14/2019  ? Renal insufficiency 05/08/2019  ? Acute on chronic diastolic CHF (congestive heart failure) ([place]) 04/07/2019  ? Left leg cellulitis 04/07/2019  ? PAF (paroxysmal atrial fibrillation) ([place]) 04/07/2019  ? Iron deficiency anemia 02/28/2019  ? 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 ([place]) 02/28/2019  ? Diabetic retinopathy associated with type 2 diabetes mellitus ([place]) 09/02/2018  ? Chronic pain 06/19/2018  ? Non-thrombocytopenic purpura ([place]) 05/21/2018  ? Gout 03/22/2018  ? Malnutrition of moderate degree 03/12/2018  ? Acute on chronic diastolic heart failure ([place]) 03/06/2018  ? Diabetes mellitus type 2, controlled ([place]) 03/06/2018  ? S/P cholecystectomy 03/06/2018  ? HTN (hypertension) 03/06/2018  ? H/O mechanical aortic valve replacement 03/06/2018  ? Moderate to severe pulmonary hypertension ([place]) 03/06/2018  ? GERD (gastroesophageal reflux disease) 02/21/2018  ? Anxiety 02/21/2018  ? Chronic diastolic CHF (congestive heart failure) ([place]) 02/21/2018  ? Diarrhea 02/21/2018  ? General weakness 02/02/2018  ? Thrombocytopenia ([place]) 01/25/2018  ? Arm pain, diffuse 01/25/2018  ? Anemia 01/23/2018  ? Cholelithiasis 01/23/2018  ? Aortic atherosclerosis ([place]) 01/23/2018  ? GI bleed 01/18/2018  ? Age-related osteoporosis without current pathological fracture 05/24/2017  ? Carotid artery occlusion 05/24/2017  ? Generalized anxiety disorder 05/24/2017  ? Hearing loss 05/24/2017  ? Presence of prosthetic heart valve 05/24/2017  ?  Coronary artery disease 08/18/2013  ? Carotid artery disease ([place]) 08/18/2013  ? Peripheral arterial disease ([place]) 08/18/2013  ? Diabetes mellitus without complication ([place]) 40/98/1191  ? Benign positional vertigo 10/06/2011  ? Hyponatremia 10/06/2011  ? Hypertension   ? Hypercholesteremia   ? Mechanical heart valve present   ? Chronic anticoagulation   ? ?PCP:  [name], [name], MD ?Pharmacy:   ?[facility] [id number] - [place] [address] ?[address] ?[place] [address] ?Phone: [phone] Fax: [phone] ? ? ? ? ?Social Determinants of Health (SDOH) Interventions ?  ? ?Readmission Risk Interventions ?Readmission Risk Prevention Plan 05/09/2019  ?Transportation Screening Complete  ?Medication Review Press photographer) Complete  ?PCP or Specialist appointment within 3-5 days of discharge Complete  ?[place] or Home Care Consult Complete  ?SW Recovery Care/Counseling Consult Complete  ?Palliative Care Screening Not Applicable  ?[place] Not Applicable  ?Some recent data might be hidden  ? ? ? ?

## 2021-12-16 NOTE — Progress Notes (Signed)
Blood transfusion completed at 1450hrs, not 1244hrs as charted in the transfusion flowsheet. No s/s of transfusion reaction noted ?

## 2021-12-17 LAB — TYPE AND SCREEN
ABO/RH(D): A POS
Antibody Screen: NEGATIVE
Unit division: 0
Unit division: 0

## 2021-12-17 LAB — BPAM RBC
Blood Product Expiration Date: 202303132359
Blood Product Expiration Date: 202303132359
ISSUE DATE / TIME: 202303091017
ISSUE DATE / TIME: 202303101140
Unit Type and Rh: 6200
Unit Type and Rh: 9500

## 2021-12-20 ENCOUNTER — Inpatient Hospital Stay: Payer: Medicare HMO | Admitting: Hematology and Oncology

## 2021-12-20 ENCOUNTER — Other Ambulatory Visit: Payer: Self-pay

## 2021-12-20 ENCOUNTER — Encounter: Payer: Self-pay | Admitting: Hematology and Oncology

## 2021-12-20 ENCOUNTER — Inpatient Hospital Stay: Payer: Medicare HMO

## 2021-12-20 VITALS — BP 135/59 | HR 42 | Temp 98.0°F | Resp 18 | Wt 126.0 lb

## 2021-12-20 DIAGNOSIS — I739 Peripheral vascular disease, unspecified: Secondary | ICD-10-CM

## 2021-12-20 DIAGNOSIS — D594 Other nonautoimmune hemolytic anemias: Secondary | ICD-10-CM

## 2021-12-20 DIAGNOSIS — N184 Chronic kidney disease, stage 4 (severe): Secondary | ICD-10-CM

## 2021-12-20 DIAGNOSIS — D508 Other iron deficiency anemias: Secondary | ICD-10-CM

## 2021-12-20 DIAGNOSIS — D631 Anemia in chronic kidney disease: Secondary | ICD-10-CM | POA: Diagnosis not present

## 2021-12-20 DIAGNOSIS — D61818 Other pancytopenia: Secondary | ICD-10-CM

## 2021-12-20 DIAGNOSIS — D696 Thrombocytopenia, unspecified: Secondary | ICD-10-CM | POA: Diagnosis not present

## 2021-12-20 DIAGNOSIS — I7782 Antineutrophilic cytoplasmic antibody (ANCA) vasculitis: Secondary | ICD-10-CM | POA: Diagnosis not present

## 2021-12-20 DIAGNOSIS — M8580 Other specified disorders of bone density and structure, unspecified site: Secondary | ICD-10-CM | POA: Diagnosis not present

## 2021-12-20 DIAGNOSIS — Z5112 Encounter for antineoplastic immunotherapy: Secondary | ICD-10-CM | POA: Diagnosis not present

## 2021-12-20 LAB — CBC WITH DIFFERENTIAL/PLATELET
Abs Immature Granulocytes: 0.04 10*3/uL (ref 0.00–0.07)
Basophils Absolute: 0 10*3/uL (ref 0.0–0.1)
Basophils Relative: 1 %
Eosinophils Absolute: 0.3 10*3/uL (ref 0.0–0.5)
Eosinophils Relative: 4 %
HCT: 24 % — ABNORMAL LOW (ref 36.0–46.0)
Hemoglobin: 7.7 g/dL — ABNORMAL LOW (ref 12.0–15.0)
Immature Granulocytes: 1 %
Lymphocytes Relative: 12 %
Lymphs Abs: 0.7 10*3/uL (ref 0.7–4.0)
MCH: 31.3 pg (ref 26.0–34.0)
MCHC: 32.1 g/dL (ref 30.0–36.0)
MCV: 97.6 fL (ref 80.0–100.0)
Monocytes Absolute: 0.5 10*3/uL (ref 0.1–1.0)
Monocytes Relative: 8 %
Neutro Abs: 4.5 10*3/uL (ref 1.7–7.7)
Neutrophils Relative %: 74 %
Platelets: 134 10*3/uL — ABNORMAL LOW (ref 150–400)
RBC: 2.46 MIL/uL — ABNORMAL LOW (ref 3.87–5.11)
RDW: 19.4 % — ABNORMAL HIGH (ref 11.5–15.5)
WBC: 6.1 10*3/uL (ref 4.0–10.5)
nRBC: 0.5 % — ABNORMAL HIGH (ref 0.0–0.2)

## 2021-12-20 LAB — RETICULOCYTES
Immature Retic Fract: 31.3 % — ABNORMAL HIGH (ref 2.3–15.9)
RBC.: 2.45 MIL/uL — ABNORMAL LOW (ref 3.87–5.11)
Retic Count, Absolute: 195.8 10*3/uL — ABNORMAL HIGH (ref 19.0–186.0)
Retic Ct Pct: 8 % — ABNORMAL HIGH (ref 0.4–3.1)

## 2021-12-20 LAB — FERRITIN: Ferritin: 115 ng/mL (ref 11–307)

## 2021-12-20 LAB — SAVE SMEAR(SSMR), FOR PROVIDER SLIDE REVIEW

## 2021-12-20 MED ORDER — SODIUM CHLORIDE 0.9 % IV SOLN: Freq: Once | INTRAVENOUS | Status: AC

## 2021-12-20 MED ORDER — SODIUM CHLORIDE 0.9 % IV SOLN
375.0000 mg/m2 | Freq: Once | INTRAVENOUS | Status: AC
  Administered 2021-12-20: 600 mg via INTRAVENOUS
  Filled 2021-12-20: qty 50

## 2021-12-20 MED ORDER — DIPHENHYDRAMINE HCL 25 MG PO CAPS
25.0000 mg | ORAL_CAPSULE | Freq: Once | ORAL | Status: AC
  Administered 2021-12-20: 25 mg via ORAL
  Filled 2021-12-20: qty 1

## 2021-12-20 MED ORDER — ACETAMINOPHEN 325 MG PO TABS
650.0000 mg | ORAL_TABLET | Freq: Once | ORAL | Status: AC
  Administered 2021-12-20: 650 mg via ORAL
  Filled 2021-12-20: qty 2

## 2021-12-20 NOTE — Patient Instructions (Signed)
[facility]  ? Discharge Instructions: ?Thank you for choosing [facility] to provide your oncology and hematology care.  ? ?If you have a lab appointment with the [facility], please go directly to the [facility] and check in at the registration area. ?  ?Wear comfortable clothing and clothing appropriate for easy access to any Portacath or PICC line.  ? ?We strive to give you quality time with your provider. You may need to reschedule your appointment if you arrive late (15 or more minutes).  Arriving late affects you and other patients whose appointments are after yours.  Also, if you miss three or more appointments without notifying the office, you may be dismissed from the clinic at the provider?s discretion.    ?  ?For prescription refill requests, have your pharmacy contact our office and allow 72 hours for refills to be completed.   ? ?Today you received the following chemotherapy and/or immunotherapy agents: rituximab.    ?  ?To help prevent nausea and vomiting after your treatment, we encourage you to take your nausea medication as directed. ? ?BELOW ARE SYMPTOMS THAT SHOULD BE REPORTED IMMEDIATELY: ?*FEVER GREATER THAN 100.4 F (38 ?C) OR HIGHER ?*CHILLS OR SWEATING ?*NAUSEA AND VOMITING THAT IS NOT CONTROLLED WITH YOUR NAUSEA MEDICATION ?*UNUSUAL SHORTNESS OF BREATH ?*UNUSUAL BRUISING OR BLEEDING ?*URINARY PROBLEMS (pain or burning when urinating, or frequent urination) ?*BOWEL PROBLEMS (unusual diarrhea, constipation, pain near the anus) ?TENDERNESS IN MOUTH AND THROAT WITH OR WITHOUT PRESENCE OF ULCERS (sore throat, sores in mouth, or a toothache) ?UNUSUAL RASH, SWELLING OR PAIN  ?UNUSUAL VAGINAL DISCHARGE OR ITCHING  ? ?Items with * indicate a potential emergency and should be followed up as soon as possible or go to the Emergency Department if any problems should occur. ? ?Please show the CHEMOTHERAPY ALERT CARD or IMMUNOTHERAPY ALERT CARD at check-in  to the Emergency Department and triage nurse. ? ?Should you have questions after your visit or need to cancel or reschedule your appointment, please contact [facility]  Dept: [phone]  and follow the prompts.  Office hours are 8:00 a.m. to 4:30 p.m. Monday - Friday. Please note that voicemails left after 4:00 p.m. may not be returned until the following business day.  We are closed weekends and major holidays. You have access to a nurse at all times for urgent questions. Please call the main number to the clinic Dept: [phone] and follow the prompts. ? ? ?For any non-urgent questions, you may also contact your provider using MyChart. We now offer e-Visits for anyone 51 and older to request care online for non-urgent symptoms. For details visit [email or web address]. ?  ?Also download the MyChart app! Go to the app store, search "MyChart", open the app, select [facility], and log in with your MyChart username and password. ? ?Due to Covid, a mask is required upon entering the hospital/clinic. If you do not have a mask, one will be given to you upon arrival. For doctor visits, patients may have 1 support person aged 59 or older with them. For treatment visits, patients cannot have anyone with them due to current Covid guidelines and our immunocompromised population.  ? ?

## 2021-12-20 NOTE — Progress Notes (Signed)
[facility] up: ?  ? ?[name], [name], MD ?[address] ?[place] [address] ? ? ?DIAGNOSIS:  ? ?SUMMARY OF HEMATOLOGIC HISTORY: ?81 y.o. [name] woman with multifactorial anemia, as follows: ?            (a) anemia of chronic inflammation: As of May 2020 she has a SED rate of 95, CRP 1.1, positive ANA and RF; subsequently she was found to be ANCA positive ?            (b) hemolysis (mild): she has a mildly elevated LDH and a DAT positive for complement, not IgG; this is c/w (a) above ?            (c) anemia of renal insufficiency: inadequate EPO resonse to anemia    ?                     (d) GIB/ iron deficiency in patient on lifelong anticoagulaton ?  ?(1) Feraheme received 03/01/2019, repeated 03/20/2019  ?            (a) reticulocyte 03/07/2019 up from 43.4 a year ago to 191.8 after iron infusion ?            (b) feraheme repeated on 05/12/2019 ?            (c) subsequent ferritin levels >100 ?  ?(2) darbepoetin: starting 05/05/2019 at 168mg given every 2 weeks ?            (a) dose increased to 200 mcg every week starting 07/01/2019  ?            (b) back to every 2 weeks beginning 08/04/2019 still at 200 mcg dose ?            (c) switched to Retacrit every 4 weeks as of 08/18/2019 ?  ?(3) ANCA positive vasculitis: diagnosed 05/08/2019 (titer 1:640) ?            (a) prednisone 40 mg daily started 05/31/2019 ?            (b) rituximab weekly started 06/12/2019 (received test dose 06/05/2019) ?                        (i) hepatitis B sAg, core Ab and HIV negative 05/08/2019 ?                        (ii) rituximab held after 07/14/2019 dose (received 5 weekly doses) with neutropenia ?                        (iii) rituximab resumed 08/18/2019 but changed to monthly ?                        (iv) rituximab changed to every 2 months beginning with 02/02/2020 dose ?                        (v) rituximab changed to every 12 weeks after the 10/12/2020 dose ?            (c) prednisone  tapered to off as of 09/15/2019 ?  ?(4) osteopenia with a T score of -1.5 on bone density 02/28/2016 at the breast center ? ?CURRENT THERAPY: Rituximab ? ?INTERVAL HISTORY: ?[name] PELLEY89y.o. female returns for her follow-up of anemia. ?  Her hemoglobin today is 7.7.   ?She doesn't feel as tired or SOB. Her palpitations are better. ?She denies any headaches or lightheadedness. ?No bleeding complaints ?She is tolerating rituxan well ?Rest of  the pertinent 10 point ROS reviewed and negative. ? ? ?Patient Active Problem List  ? Diagnosis Date Noted  ? Symptomatic anemia 12/15/2021  ? Abnormal chest x-ray 12/15/2021  ? Supratherapeutic INR 12/15/2021  ? Elevated troponin 12/15/2021  ? Unilateral primary osteoarthritis, right hip 05/31/2021  ? Midline cystocele 05/30/2021  ? Primary localized osteoarthritis of pelvic region and thigh 05/30/2021  ? Vaginal pain 05/30/2021  ? Asymmetric SNHL (sensorineural hearing loss) 01/06/2021  ? Referred otalgia of both ears 01/06/2021  ? Bilateral hearing loss 08/31/2020  ? Bilateral impacted cerumen 08/31/2020  ? Low back pain 02/20/2020  ? CKD (chronic kidney disease), stage IV ([place]) 10/13/2019  ? Leukopenia due to antineoplastic chemotherapy ([place]) 08/18/2019  ? Chemotherapy induced neutropenia ([place]) 07/21/2019  ? Thrombophilia ([place]) 06/19/2019  ? Encounter for general adult medical examination without abnormal findings 06/13/2019  ? Arteritis ([place]) 06/06/2019  ? Hypertensive heart and chronic kidney disease with heart failure and stage 1 through stage 4 chronic kidney disease, or unspecified chronic kidney disease ([place]) 06/06/2019  ? Muscle weakness 06/06/2019  ? Pancytopenia ([place]) 05/28/2019  ? Dyspnea 05/28/2019  ? Vasculitis, ANCA positive 05/28/2019  ? Disorder of connective tissue ([place]) 05/14/2019  ? Renal insufficiency 05/08/2019  ? Acute on chronic diastolic CHF (congestive heart failure) ([place]) 04/07/2019  ? Left leg cellulitis 04/07/2019  ? PAF (paroxysmal atrial  fibrillation) ([place]) 04/07/2019  ? Iron deficiency anemia 02/28/2019  ? 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 ([place]) 02/28/2019  ? Diabetic retinopathy associated with type 2 diabetes mellitus ([place]) 09/02/2018  ? Chronic pain 06/19/2018  ? Non-thrombocytopenic purpura ([place]) 05/21/2018  ? Gout 03/22/2018  ? Malnutrition of moderate degree 03/12/2018  ? Acute on chronic diastolic heart failure ([place]) 03/06/2018  ? Diabetes mellitus type 2, controlled ([place]) 03/06/2018  ? S/P cholecystectomy 03/06/2018  ? HTN (hypertension) 03/06/2018  ? H/O mechanical aortic valve replacement 03/06/2018  ? Moderate to severe pulmonary hypertension ([place]) 03/06/2018  ? GERD (gastroesophageal reflux disease) 02/21/2018  ? Anxiety 02/21/2018  ? Chronic diastolic CHF (congestive heart failure) ([place]) 02/21/2018  ? Diarrhea 02/21/2018  ? General weakness 02/02/2018  ? Thrombocytopenia ([place]) 01/25/2018  ? Arm pain, diffuse 01/25/2018  ? Anemia 01/23/2018  ? Cholelithiasis 01/23/2018  ? Aortic atherosclerosis ([place]) 01/23/2018  ? GI bleed 01/18/2018  ? Age-related osteoporosis without current pathological fracture 05/24/2017  ? Carotid artery occlusion 05/24/2017  ? Generalized anxiety disorder 05/24/2017  ? Hearing loss 05/24/2017  ? Presence of prosthetic heart valve 05/24/2017  ? Coronary artery disease 08/18/2013  ? Carotid artery disease ([place]) 08/18/2013  ? Peripheral arterial disease ([place]) 08/18/2013  ? Diabetes mellitus without complication ([place]) 25/42/7062  ? Benign positional vertigo 10/06/2011  ? Hyponatremia 10/06/2011  ? Hypertension   ? Hypercholesteremia   ? Mechanical heart valve present   ? Chronic anticoagulation   ? ? ?is allergic to flagyl [metronidazole], ciprofloxacin, coreg [carvedilol], losartan, sulfamethoxazole-trimethoprim, verapamil, zetia [ezetimibe], zocor [simvastatin - high dose], and gatifloxacin. ? ?MEDICAL HISTORY: ?Past Medical History:  ?Diagnosis Date  ? Aortic atherosclerosis ([place])  01/23/2018  ? Atrial fibrillation ([place])   ? Carotid artery disease ([place])   ? Cholelithiasis 01/23/2018  ? Chronic anticoagulation   ? Coronary artery disease   ? status post coronary artery bypass grafting times 07/10/2004  ?  Diabetes mellitus   ? Hypercholesteremia   ? Hypertension   ? Mechanical heart valve present   ? H. aortic valve replacement at the time of bypass surgery October 2005  ? Moderate to severe pulmonary hypertension (HCC)   ? Peripheral arterial disease ([place])   ? history of left common iliac artery PTA and stenting for a chronic total occlusion 08/26/01  ? ? ?SURGICAL HISTORY: ?Past Surgical History:  ?Procedure Laterality Date  ? anterior repair  2009  ? AORTIC VALVE REPLACEMENT  2005  ? Dr. Cyndia Bent  ? CARDIAC CATHETERIZATION  11/10/2004  ? 40% right common illiac, 70% in stent restenosis of distal left common illiac,   ? CARDIAC CATHETERIZATION  05/18/2004  ? LAD 50-70% midstenosis, RCA dominant w/50% stenosis, 50% Right common Illiac artery ostial stenosis, 90% in stent restenosis within midportion of left common illiac stent  ? Carotid Duplex  03/12/2012  ? RSA-elev. velocities suggestive of a 50-69% diameter reduction, Right&Left Bulb/Prox ICA-mild-mod.fibrous plaqueelevating Velocities abnormal study.  ? CHOLECYSTECTOMY N/A 03/01/2018  ? Procedure: LAPAROSCOPIC CHOLECYSTECTOMY;  Surgeon: Judeth Horn, MD;  Location: [facility];  Service: General;  Laterality: N/A;  ? COLONOSCOPY WITH PROPOFOL N/A 01/22/2018  ? Procedure: COLONOSCOPY WITH PROPOFOL;  Surgeon: Wilford Corner, MD;  Location: [facility];  Service: Endoscopy;  Laterality: N/A;  ? ESOPHAGOGASTRODUODENOSCOPY (EGD) WITH PROPOFOL N/A 01/22/2018  ? Procedure: ESOPHAGOGASTRODUODENOSCOPY (EGD) WITH PROPOFOL;  Surgeon: Wilford Corner, MD;  Location: [facility];  Service: Endoscopy;  Laterality: N/A;  ? ESOPHAGOGASTRODUODENOSCOPY (EGD) WITH PROPOFOL N/A 11/21/2018  ? Procedure: ESOPHAGOGASTRODUODENOSCOPY (EGD) WITH PROPOFOL;  Surgeon: [name], MD;  Location: [facility];  Service: Gastroenterology;  Laterality: N/A;  ? ESOPHAGOGASTRODUODENOSCOPY (EGD) WITH PROPOFOL Left 02/27/2019  ? Procedure: ESOPHAGOGASTRODUODENOSCOPY (EGD) WITH PROPOFOL;

## 2021-12-21 ENCOUNTER — Other Ambulatory Visit: Payer: Self-pay | Admitting: *Deleted

## 2021-12-21 ENCOUNTER — Telehealth: Payer: Self-pay | Admitting: *Deleted

## 2021-12-21 NOTE — Telephone Encounter (Signed)
This RN called pt per noted appt scheduled 3/9 for blood transfusion on 3/16. Per MD visit yesterday pt does not need blood transfusion presently. ? ?Per discussion with pt she stated " I don't think I want to get that infusion again on Monday ( 3/20 ) because since I got that medication I am having issues with my mouth and throat feels raw, and my stomach doesn't feel right and I am constipated " ? ?She states the above started after getting the new generic form of rituxin. ? ?She is using salt water rinses in her mouth. ? ?She denies any noted redness or white patches. ? ?Per her stomach " just not feeling right " she is unable to pinpoint symptoms better. ? ?She states she is constipated and had to use miralax on Monday with benefit- she has not had another BM since Monday. ? ?This RN discussed above with pt- including need to use the miralax in AM if she does not have a BM today with goal of BM at least every 3rd day. ?Above information will be forwarded to MD and pharmacy for review and any further recommendations. ?

## 2021-12-22 ENCOUNTER — Inpatient Hospital Stay: Payer: Medicare HMO

## 2021-12-22 ENCOUNTER — Other Ambulatory Visit: Payer: Medicare HMO

## 2021-12-23 DIAGNOSIS — I2510 Atherosclerotic heart disease of native coronary artery without angina pectoris: Secondary | ICD-10-CM | POA: Diagnosis not present

## 2021-12-23 DIAGNOSIS — I272 Pulmonary hypertension, unspecified: Secondary | ICD-10-CM | POA: Diagnosis not present

## 2021-12-23 DIAGNOSIS — D631 Anemia in chronic kidney disease: Secondary | ICD-10-CM | POA: Diagnosis not present

## 2021-12-23 DIAGNOSIS — I130 Hypertensive heart and chronic kidney disease with heart failure and stage 1 through stage 4 chronic kidney disease, or unspecified chronic kidney disease: Secondary | ICD-10-CM | POA: Diagnosis not present

## 2021-12-23 DIAGNOSIS — N189 Chronic kidney disease, unspecified: Secondary | ICD-10-CM | POA: Diagnosis not present

## 2021-12-23 DIAGNOSIS — E1165 Type 2 diabetes mellitus with hyperglycemia: Secondary | ICD-10-CM | POA: Diagnosis not present

## 2021-12-23 DIAGNOSIS — F419 Anxiety disorder, unspecified: Secondary | ICD-10-CM | POA: Diagnosis not present

## 2021-12-23 DIAGNOSIS — I509 Heart failure, unspecified: Secondary | ICD-10-CM | POA: Diagnosis not present

## 2021-12-23 DIAGNOSIS — E1122 Type 2 diabetes mellitus with diabetic chronic kidney disease: Secondary | ICD-10-CM | POA: Diagnosis not present

## 2021-12-26 ENCOUNTER — Inpatient Hospital Stay: Payer: Medicare HMO

## 2021-12-26 ENCOUNTER — Telehealth: Payer: Self-pay | Admitting: *Deleted

## 2021-12-26 ENCOUNTER — Other Ambulatory Visit: Payer: Self-pay | Admitting: *Deleted

## 2021-12-26 ENCOUNTER — Other Ambulatory Visit: Payer: Medicare HMO

## 2021-12-26 ENCOUNTER — Inpatient Hospital Stay: Payer: Medicare HMO | Admitting: Hematology and Oncology

## 2021-12-26 ENCOUNTER — Encounter: Payer: Self-pay | Admitting: Hematology and Oncology

## 2021-12-26 ENCOUNTER — Other Ambulatory Visit: Payer: Self-pay

## 2021-12-26 ENCOUNTER — Ambulatory Visit: Payer: Medicare HMO | Admitting: Hematology and Oncology

## 2021-12-26 VITALS — BP 127/49 | HR 56 | Temp 97.9°F | Resp 16 | Ht 59.0 in | Wt 130.9 lb

## 2021-12-26 VITALS — BP 167/53 | HR 49 | Temp 97.9°F | Resp 18

## 2021-12-26 DIAGNOSIS — I7782 Antineutrophilic cytoplasmic antibody (ANCA) vasculitis: Secondary | ICD-10-CM

## 2021-12-26 DIAGNOSIS — M8580 Other specified disorders of bone density and structure, unspecified site: Secondary | ICD-10-CM | POA: Diagnosis not present

## 2021-12-26 DIAGNOSIS — D594 Other nonautoimmune hemolytic anemias: Secondary | ICD-10-CM

## 2021-12-26 DIAGNOSIS — D61818 Other pancytopenia: Secondary | ICD-10-CM

## 2021-12-26 DIAGNOSIS — N184 Chronic kidney disease, stage 4 (severe): Secondary | ICD-10-CM

## 2021-12-26 DIAGNOSIS — D631 Anemia in chronic kidney disease: Secondary | ICD-10-CM | POA: Diagnosis not present

## 2021-12-26 DIAGNOSIS — Z5112 Encounter for antineoplastic immunotherapy: Secondary | ICD-10-CM | POA: Diagnosis not present

## 2021-12-26 LAB — CMP (CANCER CENTER ONLY)
ALT: 11 U/L (ref 0–44)
AST: 16 U/L (ref 15–41)
Albumin: 3.4 g/dL — ABNORMAL LOW (ref 3.5–5.0)
Alkaline Phosphatase: 61 U/L (ref 38–126)
Anion gap: 10 (ref 5–15)
BUN: 87 mg/dL — ABNORMAL HIGH (ref 8–23)
CO2: 21 mmol/L — ABNORMAL LOW (ref 22–32)
Calcium: 8.2 mg/dL — ABNORMAL LOW (ref 8.9–10.3)
Chloride: 103 mmol/L (ref 98–111)
Creatinine: 2.54 mg/dL — ABNORMAL HIGH (ref 0.44–1.00)
GFR, Estimated: 18 mL/min — ABNORMAL LOW (ref >60)
Glucose, Bld: 306 mg/dL — ABNORMAL HIGH (ref 70–99)
Potassium: 4.4 mmol/L (ref 3.5–5.1)
Sodium: 134 mmol/L — ABNORMAL LOW (ref 135–145)
Total Bilirubin: 0.4 mg/dL (ref 0.3–1.2)
Total Protein: 5.3 g/dL — ABNORMAL LOW (ref 6.5–8.1)

## 2021-12-26 LAB — CBC WITH DIFFERENTIAL (CANCER CENTER ONLY)
Abs Immature Granulocytes: 0.01 10*3/uL (ref 0.00–0.07)
Basophils Absolute: 0 10*3/uL (ref 0.0–0.1)
Basophils Relative: 1 %
Eosinophils Absolute: 0.2 10*3/uL (ref 0.0–0.5)
Eosinophils Relative: 5 %
HCT: 21.1 % — ABNORMAL LOW (ref 36.0–46.0)
Hemoglobin: 6.6 g/dL — CL (ref 12.0–15.0)
Immature Granulocytes: 0 %
Lymphocytes Relative: 14 %
Lymphs Abs: 0.6 10*3/uL — ABNORMAL LOW (ref 0.7–4.0)
MCH: 31.3 pg (ref 26.0–34.0)
MCHC: 31.3 g/dL (ref 30.0–36.0)
MCV: 100 fL (ref 80.0–100.0)
Monocytes Absolute: 0.3 10*3/uL (ref 0.1–1.0)
Monocytes Relative: 8 %
Neutro Abs: 2.9 10*3/uL (ref 1.7–7.7)
Neutrophils Relative %: 72 %
Platelet Count: 116 10*3/uL — ABNORMAL LOW (ref 150–400)
RBC: 2.11 MIL/uL — ABNORMAL LOW (ref 3.87–5.11)
RDW: 19.1 % — ABNORMAL HIGH (ref 11.5–15.5)
WBC Count: 4.1 10*3/uL (ref 4.0–10.5)
nRBC: 0 % (ref 0.0–0.2)

## 2021-12-26 LAB — RETIC PANEL
Immature Retic Fract: 9.3 % (ref 2.3–15.9)
RBC.: 2.08 MIL/uL — ABNORMAL LOW (ref 3.87–5.11)
Retic Count, Absolute: 106.3 10*3/uL (ref 19.0–186.0)
Retic Ct Pct: 5.1 % — ABNORMAL HIGH (ref 0.4–3.1)
Reticulocyte Hemoglobin: 33.6 pg (ref >27.9)

## 2021-12-26 LAB — LACTATE DEHYDROGENASE: LDH: 184 U/L (ref 98–192)

## 2021-12-26 LAB — SAMPLE TO BLOOD BANK

## 2021-12-26 LAB — PREPARE RBC (CROSSMATCH)

## 2021-12-26 MED ORDER — SODIUM CHLORIDE 0.9% IV SOLUTION
250.0000 mL | Freq: Once | INTRAVENOUS | Status: AC
  Administered 2021-12-26: 250 mL via INTRAVENOUS

## 2021-12-26 MED ORDER — ACETAMINOPHEN 325 MG PO TABS
650.0000 mg | ORAL_TABLET | Freq: Once | ORAL | Status: AC
  Administered 2021-12-26: 650 mg via ORAL
  Filled 2021-12-26: qty 2

## 2021-12-26 MED ORDER — SODIUM CHLORIDE 0.9 % IV SOLN: INTRAVENOUS | Status: DC

## 2021-12-26 MED ORDER — SODIUM CHLORIDE 0.9 % IV SOLN
375.0000 mg/m2 | Freq: Once | INTRAVENOUS | Status: AC
  Administered 2021-12-26: 600 mg via INTRAVENOUS
  Filled 2021-12-26: qty 50

## 2021-12-26 MED ORDER — DIPHENHYDRAMINE HCL 25 MG PO CAPS
25.0000 mg | ORAL_CAPSULE | Freq: Once | ORAL | Status: AC
  Administered 2021-12-26: 25 mg via ORAL
  Filled 2021-12-26: qty 1

## 2021-12-26 MED ORDER — SODIUM CHLORIDE 0.9 % IV SOLN: Freq: Once | INTRAVENOUS | Status: AC

## 2021-12-26 NOTE — Telephone Encounter (Signed)
Orders placed for blood - and verified with blood bank. ?

## 2021-12-26 NOTE — Progress Notes (Signed)
[name]: ?  ? ?[name], [name], MD ?[address] ?[place] [address] ? ? ?DIAGNOSIS:  ? ?SUMMARY OF HEMATOLOGIC HISTORY: ?81 y.o. [place] woman with multifactorial anemia, as follows: ?            (a) anemia of chronic inflammation: As of May 2020 she has a SED rate of 95, CRP 1.1, positive ANA and RF; subsequently she was found to be ANCA positive ?            (b) hemolysis (mild): she has a mildly elevated LDH and a DAT positive for complement, not IgG; this is c/w (a) above ?            (c) anemia of renal insufficiency: inadequate EPO resonse to anemia    ?                     (d) GIB/ iron deficiency in patient on lifelong anticoagulaton ?  ?(1) Feraheme received 03/01/2019, repeated 03/20/2019  ?            (a) reticulocyte 03/07/2019 up from 43.4 a year ago to 191.8 after iron infusion ?            (b) feraheme repeated on 05/12/2019 ?            (c) subsequent ferritin levels >100 ?  ?(2) darbepoetin: starting 05/05/2019 at 144mg given every 2 weeks ?            (a) dose increased to 200 mcg every week starting 07/01/2019  ?            (b) back to every 2 weeks beginning 08/04/2019 still at 200 mcg dose ?            (c) switched to Retacrit every 4 weeks as of 08/18/2019 ?  ?(3) ANCA positive vasculitis: diagnosed 05/08/2019 (titer 1:640) ?            (a) prednisone 40 mg daily started 05/31/2019 ?            (b) rituximab weekly started 06/12/2019 (received test dose 06/05/2019) ?                        (i) hepatitis B sAg, core Ab and HIV negative 05/08/2019 ?                        (ii) rituximab held after 07/14/2019 dose (received 5 weekly doses) with neutropenia ?                        (iii) rituximab resumed 08/18/2019 but changed to monthly ?                        (iv) rituximab changed to every 2 months beginning with 02/02/2020 dose ?                        (v) rituximab changed to every 12 weeks after the 10/12/2020 dose ?            (c) prednisone  tapered to off as of 09/15/2019 ?  ?(4) osteopenia with a T score of -1.5 on bone density 02/28/2016 at the breast center ? ?CURRENT THERAPY: Rituximab ? ?INTERVAL HISTORY: ? ?[name] 81y.o. female returns for her follow-up of  anemia. ?Her hemoglobin today is 6.6.  She says she feels pretty tired.  She says after her last infusion, her throat felt a little sore, she felt like her limbs are swollen but then again she reports that she had the symptoms for couple weeks so they might not quite be related to the rituximab. ?She denies any hematochezia or melena.  Urine is pale-colored.  She is otherwise tolerating prednisone, PPI and rituximab well. ? ?Rest of  the pertinent 10 point ROS reviewed and negative. ? ? ?Patient Active Problem List  ? Diagnosis Date Noted  ? Symptomatic anemia 12/15/2021  ? Abnormal chest x-ray 12/15/2021  ? Supratherapeutic INR 12/15/2021  ? Elevated troponin 12/15/2021  ? Unilateral primary osteoarthritis, right hip 05/31/2021  ? Midline cystocele 05/30/2021  ? Primary localized osteoarthritis of pelvic region and thigh 05/30/2021  ? Vaginal pain 05/30/2021  ? Asymmetric SNHL (sensorineural hearing loss) 01/06/2021  ? Referred otalgia of both ears 01/06/2021  ? Bilateral hearing loss 08/31/2020  ? Bilateral impacted cerumen 08/31/2020  ? Low back pain 02/20/2020  ? CKD (chronic kidney disease), stage IV ([place]) 10/13/2019  ? Leukopenia due to antineoplastic chemotherapy ([place]) 08/18/2019  ? Chemotherapy induced neutropenia ([place]) 07/21/2019  ? Thrombophilia ([place]) 06/19/2019  ? Encounter for general adult medical examination without abnormal findings 06/13/2019  ? Arteritis ([place]) 06/06/2019  ? Hypertensive heart and chronic kidney disease with heart failure and stage 1 through stage 4 chronic kidney disease, or unspecified chronic kidney disease ([place]) 06/06/2019  ? Muscle weakness 06/06/2019  ? Pancytopenia ([place]) 05/28/2019  ? Dyspnea 05/28/2019  ? Vasculitis, ANCA positive 05/28/2019  ?  Disorder of connective tissue ([place]) 05/14/2019  ? Renal insufficiency 05/08/2019  ? Acute on chronic diastolic CHF (congestive heart failure) ([place]) 04/07/2019  ? Left leg cellulitis 04/07/2019  ? PAF (paroxysmal atrial fibrillation) ([place]) 04/07/2019  ? Iron deficiency anemia 02/28/2019  ? 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 ([place]) 02/28/2019  ? Diabetic retinopathy associated with type 2 diabetes mellitus ([place]) 09/02/2018  ? Chronic pain 06/19/2018  ? Non-thrombocytopenic purpura ([place]) 05/21/2018  ? Gout 03/22/2018  ? Malnutrition of moderate degree 03/12/2018  ? Acute on chronic diastolic heart failure ([place]) 03/06/2018  ? Diabetes mellitus type 2, controlled ([place]) 03/06/2018  ? S/P cholecystectomy 03/06/2018  ? HTN (hypertension) 03/06/2018  ? H/O mechanical aortic valve replacement 03/06/2018  ? Moderate to severe pulmonary hypertension ([place]) 03/06/2018  ? GERD (gastroesophageal reflux disease) 02/21/2018  ? Anxiety 02/21/2018  ? Chronic diastolic CHF (congestive heart failure) ([place]) 02/21/2018  ? Diarrhea 02/21/2018  ? General weakness 02/02/2018  ? Thrombocytopenia ([place]) 01/25/2018  ? Arm pain, diffuse 01/25/2018  ? Anemia 01/23/2018  ? Cholelithiasis 01/23/2018  ? Aortic atherosclerosis ([place]) 01/23/2018  ? GI bleed 01/18/2018  ? Age-related osteoporosis without current pathological fracture 05/24/2017  ? Carotid artery occlusion 05/24/2017  ? Generalized anxiety disorder 05/24/2017  ? Hearing loss 05/24/2017  ? Presence of prosthetic heart valve 05/24/2017  ? Coronary artery disease 08/18/2013  ? Carotid artery disease ([place]) 08/18/2013  ? Peripheral arterial disease ([place]) 08/18/2013  ? Diabetes mellitus without complication ([place]) 43/32/9518  ? Benign positional vertigo 10/06/2011  ? Hyponatremia 10/06/2011  ? Hypertension   ? Hypercholesteremia   ? Mechanical heart valve present   ? Chronic anticoagulation   ? ? ?is allergic to flagyl [metronidazole], ciprofloxacin, coreg [carvedilol],  losartan, sulfamethoxazole-trimethoprim, verapamil, zetia [ezetimibe], zocor [simvastatin - high dose], and gatifloxacin. ? ?MEDICAL HISTORY: ?Past Medical History:  ?Diagnosis Date  ?  Aortic atherosclerosis ([place]) 01/23/2018  ? Atrial fibrillation ([place])   ? Carotid artery disease ([place])   ? Cholelithiasis 01/23/2018  ? Chronic anticoagulation   ? Coronary artery disease   ? status post coronary artery bypass grafting times 07/10/2004  ? Diabetes mellitus   ? Hypercholesteremia   ? Hypertension   ? Mechanical heart valve present   ? H. aortic valve replacement at the time of bypass surgery October 2005  ? Moderate to severe pulmonary hypertension (HCC)   ? Peripheral arterial disease ([place])   ? history of left common iliac artery PTA and stenting for a chronic total occlusion 08/26/01  ? ? ?SURGICAL HISTORY: ?Past Surgical History:  ?Procedure Laterality Date  ? anterior repair  2009  ? AORTIC VALVE REPLACEMENT  2005  ? Dr. Cyndia Bent  ? CARDIAC CATHETERIZATION  11/10/2004  ? 40% right common illiac, 70% in stent restenosis of distal left common illiac,   ? CARDIAC CATHETERIZATION  05/18/2004  ? LAD 50-70% midstenosis, RCA dominant w/50% stenosis, 50% Right common Illiac artery ostial stenosis, 90% in stent restenosis within midportion of left common illiac stent  ? Carotid Duplex  03/12/2012  ? RSA-elev. velocities suggestive of a 50-69% diameter reduction, Right&Left Bulb/Prox ICA-mild-mod.fibrous plaqueelevating Velocities abnormal study.  ? CHOLECYSTECTOMY N/A 03/01/2018  ? Procedure: LAPAROSCOPIC CHOLECYSTECTOMY;  Surgeon: Judeth Horn, MD;  Location: [facility];  Service: General;  Laterality: N/A;  ? COLONOSCOPY WITH PROPOFOL N/A 01/22/2018  ? Procedure: COLONOSCOPY WITH PROPOFOL;  Surgeon: Wilford Corner, MD;  Location: [facility];  Service: Endoscopy;  Laterality: N/A;  ? ESOPHAGOGASTRODUODENOSCOPY (EGD) WITH PROPOFOL N/A 01/22/2018  ? Procedure: ESOPHAGOGASTRODUODENOSCOPY (EGD) WITH PROPOFOL;  Surgeon: Wilford Corner, MD;  Location: [facility];  Service: Endoscopy;  Laterality: N/A;  ? ESOPHAGOGASTRODUODENOSCOPY (EGD) WITH PROPOFOL N/A 11/21/2018  ? Procedure: ESOPHAGOGASTRODUODENOSCOPY (EGD) WITH PROPOFOL;  S

## 2021-12-26 NOTE — Telephone Encounter (Signed)
CRITICAL VALUE STICKER ? ?CRITICAL VALUE:  HGB 6.6 ? ?RECEIVER (on-site recipient of call): Drucie Ip, RN ? ?DATE & TIME NOTIFIED: 12/26/21  9:04 am ? ?MESSENGER (representative from lab): Rosann Auerbach ? ?MD NOTIFIED: Dr. Rob Hickman nurse ? ?TIME OF NOTIFICATION: 9:05 am ? ?RESPONSE:   ?

## 2021-12-27 LAB — TYPE AND SCREEN
ABO/RH(D): A POS
Antibody Screen: NEGATIVE
Unit division: 0

## 2021-12-27 LAB — BPAM RBC
Blood Product Expiration Date: 202304142359
ISSUE DATE / TIME: 202303201348
Unit Type and Rh: 6200

## 2021-12-29 ENCOUNTER — Inpatient Hospital Stay: Payer: Medicare HMO

## 2021-12-29 ENCOUNTER — Other Ambulatory Visit: Payer: Self-pay

## 2021-12-29 ENCOUNTER — Telehealth: Payer: Self-pay | Admitting: *Deleted

## 2021-12-29 DIAGNOSIS — Z5112 Encounter for antineoplastic immunotherapy: Secondary | ICD-10-CM | POA: Diagnosis not present

## 2021-12-29 DIAGNOSIS — D631 Anemia in chronic kidney disease: Secondary | ICD-10-CM | POA: Diagnosis not present

## 2021-12-29 DIAGNOSIS — I7782 Antineutrophilic cytoplasmic antibody (ANCA) vasculitis: Secondary | ICD-10-CM | POA: Diagnosis not present

## 2021-12-29 DIAGNOSIS — D594 Other nonautoimmune hemolytic anemias: Secondary | ICD-10-CM

## 2021-12-29 DIAGNOSIS — M8580 Other specified disorders of bone density and structure, unspecified site: Secondary | ICD-10-CM | POA: Diagnosis not present

## 2021-12-29 DIAGNOSIS — N184 Chronic kidney disease, stage 4 (severe): Secondary | ICD-10-CM | POA: Diagnosis not present

## 2021-12-29 LAB — SAMPLE TO BLOOD BANK

## 2021-12-29 LAB — LACTATE DEHYDROGENASE: LDH: 184 U/L (ref 98–192)

## 2021-12-29 LAB — CBC WITH DIFFERENTIAL (CANCER CENTER ONLY)
Abs Immature Granulocytes: 0.02 10*3/uL (ref 0.00–0.07)
Basophils Absolute: 0 10*3/uL (ref 0.0–0.1)
Basophils Relative: 0 %
Eosinophils Absolute: 0.1 10*3/uL (ref 0.0–0.5)
Eosinophils Relative: 1 %
HCT: 23.3 % — ABNORMAL LOW (ref 36.0–46.0)
Hemoglobin: 7.5 g/dL — ABNORMAL LOW (ref 12.0–15.0)
Immature Granulocytes: 0 %
Lymphocytes Relative: 5 %
Lymphs Abs: 0.3 10*3/uL — ABNORMAL LOW (ref 0.7–4.0)
MCH: 30.7 pg (ref 26.0–34.0)
MCHC: 32.2 g/dL (ref 30.0–36.0)
MCV: 95.5 fL (ref 80.0–100.0)
Monocytes Absolute: 0.1 10*3/uL (ref 0.1–1.0)
Monocytes Relative: 3 %
Neutro Abs: 5 10*3/uL (ref 1.7–7.7)
Neutrophils Relative %: 91 %
Platelet Count: 134 10*3/uL — ABNORMAL LOW (ref 150–400)
RBC: 2.44 MIL/uL — ABNORMAL LOW (ref 3.87–5.11)
RDW: 18.3 % — ABNORMAL HIGH (ref 11.5–15.5)
WBC Count: 5.5 10*3/uL (ref 4.0–10.5)
nRBC: 0 % (ref 0.0–0.2)

## 2021-12-29 LAB — RETICULOCYTES
Immature Retic Fract: 6.9 % (ref 2.3–15.9)
RBC.: 2.44 MIL/uL — ABNORMAL LOW (ref 3.87–5.11)
Retic Count, Absolute: 107.1 10*3/uL (ref 19.0–186.0)
Retic Ct Pct: 4.4 % — ABNORMAL HIGH (ref 0.4–3.1)

## 2021-12-29 NOTE — Telephone Encounter (Signed)
Pt's heme is 7.5 today- per discussion with pt she denies any increased weakness or SOB. ? ?She does state how she feels the steroids are keeping her awake at night. ? ?This RN discussed above. ? ?Plan at present is to follow up on Monday with scheduled visit and treatment. ?

## 2021-12-30 ENCOUNTER — Telehealth: Payer: Self-pay | Admitting: *Deleted

## 2021-12-30 NOTE — Telephone Encounter (Signed)
Pt called at 4:23 today stating " I feel kind of shaky today ". ? ?This RN discussed above with pt stating overall breathing is " about the same ". ? ?She is able to do what she needs to around her home- " just can't walk and talk at the same time" ? ?She has had decrease due to the steroids. Note hemoglobin yesterday was 7.5 which has been her stable reading over the past few weeks. ? ?This RN reviewed above with possible reason secondary to steroid use that may be given her a "nervous energy". ? ?She denies any sense of her heart pounding " like when I was admitted to the hospital " ? ?She does have bilateral ankle swelling. ? ?Plan per discussion- is pt will monitor symptoms over the weekend- and limit her activity to conserve energy. ?Discussed need to elevate her legs " higher then her heart " several times a day. ? ?If symptoms worsen she understands to call the on call service. ? ?Pt is scheduled for visit and treatment on Monday. ?

## 2022-01-02 ENCOUNTER — Other Ambulatory Visit: Payer: Self-pay | Admitting: *Deleted

## 2022-01-02 ENCOUNTER — Inpatient Hospital Stay: Payer: Medicare HMO

## 2022-01-02 ENCOUNTER — Other Ambulatory Visit: Payer: Self-pay

## 2022-01-02 ENCOUNTER — Inpatient Hospital Stay: Payer: Medicare HMO | Admitting: Hematology and Oncology

## 2022-01-02 ENCOUNTER — Encounter: Payer: Self-pay | Admitting: Hematology and Oncology

## 2022-01-02 VITALS — BP 154/47 | HR 61 | Temp 97.9°F | Resp 18

## 2022-01-02 DIAGNOSIS — Z5112 Encounter for antineoplastic immunotherapy: Secondary | ICD-10-CM | POA: Diagnosis not present

## 2022-01-02 DIAGNOSIS — M8580 Other specified disorders of bone density and structure, unspecified site: Secondary | ICD-10-CM | POA: Diagnosis not present

## 2022-01-02 DIAGNOSIS — D649 Anemia, unspecified: Secondary | ICD-10-CM

## 2022-01-02 DIAGNOSIS — D508 Other iron deficiency anemias: Secondary | ICD-10-CM

## 2022-01-02 DIAGNOSIS — B001 Herpesviral vesicular dermatitis: Secondary | ICD-10-CM | POA: Insufficient documentation

## 2022-01-02 DIAGNOSIS — D591 Autoimmune hemolytic anemia, unspecified: Secondary | ICD-10-CM | POA: Diagnosis not present

## 2022-01-02 DIAGNOSIS — R600 Localized edema: Secondary | ICD-10-CM

## 2022-01-02 DIAGNOSIS — D61818 Other pancytopenia: Secondary | ICD-10-CM

## 2022-01-02 DIAGNOSIS — I7782 Antineutrophilic cytoplasmic antibody (ANCA) vasculitis: Secondary | ICD-10-CM | POA: Diagnosis not present

## 2022-01-02 DIAGNOSIS — D631 Anemia in chronic kidney disease: Secondary | ICD-10-CM | POA: Diagnosis not present

## 2022-01-02 DIAGNOSIS — N184 Chronic kidney disease, stage 4 (severe): Secondary | ICD-10-CM | POA: Diagnosis not present

## 2022-01-02 LAB — CMP (CANCER CENTER ONLY)
ALT: 15 U/L (ref 0–44)
AST: 14 U/L — ABNORMAL LOW (ref 15–41)
Albumin: 3.3 g/dL — ABNORMAL LOW (ref 3.5–5.0)
Alkaline Phosphatase: 60 U/L (ref 38–126)
Anion gap: 8 (ref 5–15)
BUN: 101 mg/dL — ABNORMAL HIGH (ref 8–23)
CO2: 20 mmol/L — ABNORMAL LOW (ref 22–32)
Calcium: 8.1 mg/dL — ABNORMAL LOW (ref 8.9–10.3)
Chloride: 105 mmol/L (ref 98–111)
Creatinine: 2.29 mg/dL — ABNORMAL HIGH (ref 0.44–1.00)
GFR, Estimated: 21 mL/min — ABNORMAL LOW (ref >60)
Glucose, Bld: 236 mg/dL — ABNORMAL HIGH (ref 70–99)
Potassium: 4.3 mmol/L (ref 3.5–5.1)
Sodium: 133 mmol/L — ABNORMAL LOW (ref 135–145)
Total Bilirubin: 0.4 mg/dL (ref 0.3–1.2)
Total Protein: 5.1 g/dL — ABNORMAL LOW (ref 6.5–8.1)

## 2022-01-02 LAB — CBC WITH DIFFERENTIAL (CANCER CENTER ONLY)
Abs Immature Granulocytes: 0.06 10*3/uL (ref 0.00–0.07)
Basophils Absolute: 0 10*3/uL (ref 0.0–0.1)
Basophils Relative: 0 %
Eosinophils Absolute: 0.1 10*3/uL (ref 0.0–0.5)
Eosinophils Relative: 1 %
HCT: 20.5 % — ABNORMAL LOW (ref 36.0–46.0)
Hemoglobin: 6.8 g/dL — CL (ref 12.0–15.0)
Immature Granulocytes: 1 %
Lymphocytes Relative: 13 %
Lymphs Abs: 0.8 10*3/uL (ref 0.7–4.0)
MCH: 31.6 pg (ref 26.0–34.0)
MCHC: 33.2 g/dL (ref 30.0–36.0)
MCV: 95.3 fL (ref 80.0–100.0)
Monocytes Absolute: 0.5 10*3/uL (ref 0.1–1.0)
Monocytes Relative: 8 %
Neutro Abs: 4.6 10*3/uL (ref 1.7–7.7)
Neutrophils Relative %: 77 %
Platelet Count: 163 10*3/uL (ref 150–400)
RBC: 2.15 MIL/uL — ABNORMAL LOW (ref 3.87–5.11)
RDW: 17 % — ABNORMAL HIGH (ref 11.5–15.5)
WBC Count: 6.1 10*3/uL (ref 4.0–10.5)
nRBC: 0 % (ref 0.0–0.2)

## 2022-01-02 LAB — SAMPLE TO BLOOD BANK

## 2022-01-02 LAB — RETICULOCYTES
Immature Retic Fract: 11.5 % (ref 2.3–15.9)
RBC.: 2.17 MIL/uL — ABNORMAL LOW (ref 3.87–5.11)
Retic Count, Absolute: 92 10*3/uL (ref 19.0–186.0)
Retic Ct Pct: 4.2 % — ABNORMAL HIGH (ref 0.4–3.1)

## 2022-01-02 LAB — PREPARE RBC (CROSSMATCH)

## 2022-01-02 MED ORDER — ACETAMINOPHEN 325 MG PO TABS: 650.0000 mg | ORAL_TABLET | Freq: Once | ORAL | Status: DC

## 2022-01-02 MED ORDER — SODIUM CHLORIDE 0.9 % IV SOLN: Freq: Once | INTRAVENOUS | Status: AC

## 2022-01-02 MED ORDER — SODIUM CHLORIDE 0.9 % IV SOLN
375.0000 mg/m2 | Freq: Once | INTRAVENOUS | Status: AC
  Administered 2022-01-02: 600 mg via INTRAVENOUS
  Filled 2022-01-02: qty 10

## 2022-01-02 MED ORDER — DIPHENHYDRAMINE HCL 25 MG PO CAPS
25.0000 mg | ORAL_CAPSULE | Freq: Once | ORAL | Status: AC
  Administered 2022-01-02: 25 mg via ORAL
  Filled 2022-01-02: qty 1

## 2022-01-02 MED ORDER — VALACYCLOVIR HCL 500 MG PO TABS: 500.0000 mg | ORAL_TABLET | Freq: Two times a day (BID) | ORAL | 0 refills | Status: DC

## 2022-01-02 MED ORDER — SODIUM CHLORIDE 0.9 % IV SOLN: 375.0000 mg/m2 | Freq: Once | INTRAVENOUS | Status: DC

## 2022-01-02 MED ORDER — ACETAMINOPHEN 325 MG PO TABS
650.0000 mg | ORAL_TABLET | Freq: Once | ORAL | Status: AC
  Administered 2022-01-02: 650 mg via ORAL
  Filled 2022-01-02: qty 2

## 2022-01-02 NOTE — Assessment & Plan Note (Signed)
Her anemia is likely multifactorial but in the past she had ANCA positive hemolytic anemia maintained on rituximab had an exacerbation hence she is currently back on weekly rituximab and prednisone.  We will start tapering the prednisone since her hemolysis has improved.  She will now take prednisone 20 mg daily at least for the next couple weeks and we will reassess. ?Hemoglobin today below 7 g/dL hence she will receive 1 unit of blood transfusion. ?She will continue weekly rituximab at least for another couple weeks. ?We will assess her with the labs twice weekly and once a week follow-up. ?

## 2022-01-02 NOTE — Assessment & Plan Note (Signed)
We will send her prescription for Valtrex ?

## 2022-01-02 NOTE — Progress Notes (Signed)
Per [place] MD, no premeds prior to blood transfusion today. Pt will receive Tylenol and Benadryl prior to Rituxan infusion.  ?

## 2022-01-02 NOTE — Progress Notes (Signed)
[facility] up: ?  ? ?[name], [name], MD ?[address] ?[place] [address] ? ?DIAGNOSIS:  ? ?SUMMARY OF HEMATOLOGIC HISTORY: ?81 y.o. [name] with multifactorial anemia, as follows: ?            (a) anemia of chronic inflammation: As of May 2020 she has a SED rate of 95, CRP 1.1, positive ANA and RF; subsequently she was found to be ANCA positive ?            (b) hemolysis (mild): she has a mildly elevated LDH and a DAT positive for complement, not IgG; this is c/w (a) above ?            (c) anemia of renal insufficiency: inadequate EPO resonse to anemia    ?                     (d) GIB/ iron deficiency in patient on lifelong anticoagulaton ?  ?(1) Feraheme received 03/01/2019, repeated 03/20/2019  ?            (a) reticulocyte 03/07/2019 up from 43.4 a year ago to 191.8 after iron infusion ?            (b) feraheme repeated on 05/12/2019 ?            (c) subsequent ferritin levels >100 ?  ?(2) darbepoetin: starting 05/05/2019 at 118mg given every 2 weeks ?            (a) dose increased to 200 mcg every week starting 07/01/2019  ?            (b) back to every 2 weeks beginning 08/04/2019 still at 200 mcg dose ?            (c) switched to Retacrit every 4 weeks as of 08/18/2019 ?  ?(3) ANCA positive vasculitis: diagnosed 05/08/2019 (titer 1:640) ?            (a) prednisone 40 mg daily started 05/31/2019 ?            (b) rituximab weekly started 06/12/2019 (received test dose 06/05/2019) ?                        (i) hepatitis B sAg, core Ab and HIV negative 05/08/2019 ?                        (ii) rituximab held after 07/14/2019 dose (received 5 weekly doses) with neutropenia ?                        (iii) rituximab resumed 08/18/2019 but changed to monthly ?                        (iv) rituximab changed to every 2 months beginning with 02/02/2020 dose ?                        (v) rituximab changed to every 12 weeks after the 10/12/2020 dose ?            (c) prednisone  tapered to off as of 09/15/2019 ?  ?(4) osteopenia with a T score of -1.5 on bone density 02/28/2016 at the [facility] ? ?CURRENT THERAPY: Rituximab ? ?INTERVAL HISTORY: ? ?[name] 81y.o. female returns for her follow-up of anemia. ?  Her hemoglobin today is 6.7. ?She is worried about fluid retention and she gained about 2 lbs of weight ?She says the energy is not great ?She has been cooking, eating ok. ?She has been moving around the home ok ?No change in bowel habits or urinary habits ?She is now on prednisone 40 mg po daily ? ?Rest of  the pertinent 10 point ROS reviewed and negative. ? ? ?Patient Active Problem List  ? Diagnosis Date Noted  ? Autoimmune hemolytic anemia ([place]) 01/02/2022  ? Bilateral lower extremity edema 01/02/2022  ? Cold sore 01/02/2022  ? Symptomatic anemia 12/15/2021  ? Abnormal chest x-ray 12/15/2021  ? Supratherapeutic INR 12/15/2021  ? Elevated troponin 12/15/2021  ? Unilateral primary osteoarthritis, right hip 05/31/2021  ? Midline cystocele 05/30/2021  ? Primary localized osteoarthritis of pelvic region and thigh 05/30/2021  ? Vaginal pain 05/30/2021  ? Asymmetric SNHL (sensorineural hearing loss) 01/06/2021  ? Referred otalgia of both ears 01/06/2021  ? Bilateral hearing loss 08/31/2020  ? Bilateral impacted cerumen 08/31/2020  ? Low back pain 02/20/2020  ? CKD (chronic kidney disease), stage IV ([place]) 10/13/2019  ? Leukopenia due to antineoplastic chemotherapy ([place]) 08/18/2019  ? Chemotherapy induced neutropenia ([place]) 07/21/2019  ? Thrombophilia ([place]) 06/19/2019  ? Encounter for general adult medical examination without abnormal findings 06/13/2019  ? Arteritis ([place]) 06/06/2019  ? Hypertensive heart and chronic kidney disease with heart failure and stage 1 through stage 4 chronic kidney disease, or unspecified chronic kidney disease ([place]) 06/06/2019  ? Muscle weakness 06/06/2019  ? Pancytopenia ([place]) 05/28/2019  ? Dyspnea 05/28/2019  ? Vasculitis, ANCA positive 05/28/2019  ?  Disorder of connective tissue ([place]) 05/14/2019  ? Renal insufficiency 05/08/2019  ? Acute on chronic diastolic CHF (congestive heart failure) ([place]) 04/07/2019  ? Left leg cellulitis 04/07/2019  ? PAF (paroxysmal atrial fibrillation) ([place]) 04/07/2019  ? Iron deficiency anemia 02/28/2019  ? 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 ([place]) 02/28/2019  ? Diabetic retinopathy associated with type 2 diabetes mellitus ([place]) 09/02/2018  ? Chronic pain 06/19/2018  ? Non-thrombocytopenic purpura ([place]) 05/21/2018  ? Gout 03/22/2018  ? Malnutrition of moderate degree 03/12/2018  ? Acute on chronic diastolic heart failure ([place]) 03/06/2018  ? Diabetes mellitus type 2, controlled ([place]) 03/06/2018  ? S/P cholecystectomy 03/06/2018  ? HTN (hypertension) 03/06/2018  ? H/O mechanical aortic valve replacement 03/06/2018  ? Moderate to severe pulmonary hypertension ([place]) 03/06/2018  ? GERD (gastroesophageal reflux disease) 02/21/2018  ? Anxiety 02/21/2018  ? Chronic diastolic CHF (congestive heart failure) ([place]) 02/21/2018  ? Diarrhea 02/21/2018  ? General weakness 02/02/2018  ? Thrombocytopenia ([place]) 01/25/2018  ? Arm pain, diffuse 01/25/2018  ? Anemia 01/23/2018  ? Cholelithiasis 01/23/2018  ? Aortic atherosclerosis ([place]) 01/23/2018  ? GI bleed 01/18/2018  ? Age-related osteoporosis without current pathological fracture 05/24/2017  ? Carotid artery occlusion 05/24/2017  ? Generalized anxiety disorder 05/24/2017  ? Hearing loss 05/24/2017  ? Presence of prosthetic heart valve 05/24/2017  ? Coronary artery disease 08/18/2013  ? Carotid artery disease ([place]) 08/18/2013  ? Peripheral arterial disease ([place]) 08/18/2013  ? Diabetes mellitus without complication ([place]) 95/18/8416  ? Benign positional vertigo 10/06/2011  ? Hyponatremia 10/06/2011  ? Hypertension   ? Hypercholesteremia   ? Mechanical heart valve present   ? Chronic anticoagulation   ? ? ?is allergic to flagyl [metronidazole], ciprofloxacin, coreg [carvedilol],  losartan, sulfamethoxazole-trimethoprim, verapamil, zetia [ezetimibe], zocor [simvastatin - high dose], and gatifloxacin. ? ?MEDICAL HISTORY: ?Past Medical History:  ?Diagnosis Date  ?  Aortic atherosclerosis ([place]) 01/23/2018  ? Atrial fibrillation ([place])   ? Carotid artery disease ([place])   ? Cholelithiasis 01/23/2018  ? Chronic anticoagulation   ? Coronary artery disease   ? status post coronary artery bypass grafting times 07/10/2004  ? Diabetes mellitus   ? Hypercholesteremia   ? Hypertension   ? Mechanical heart valve present   ? H. aortic valve replacement at the time of bypass surgery October 2005  ? Moderate to severe pulmonary hypertension (H[facility])   ? Peripheral arterial disease ([place])   ? history of left common iliac artery PTA and stenting for a chronic total occlusion 08/26/01  ? ? ?SURGICAL HISTORY: ?Past Surgical History:  ?Procedure Laterality Date  ? anterior repair  2009  ? AORTIC VALVE REPLACEMENT  2005  ? Dr. Cyndia Bent  ? CARDIAC CATHETERIZATION  11/10/2004  ? 40% right common illiac, 70% in stent restenosis of distal left common illiac,   ? CARDIAC CATHETERIZATION  05/18/2004  ? LAD 50-70% midstenosis, RCA dominant w/50% stenosis, 50% Right common Illiac artery ostial stenosis, 90% in stent restenosis within midportion of left common illiac stent  ? Carotid Duplex  03/12/2012  ? RSA-elev. velocities suggestive of a 50-69% diameter reduction, Right&Left Bulb/Prox ICA-mild-mod.fibrous plaqueelevating Velocities abnormal study.  ? CHOLECYSTECTOMY N/A 03/01/2018  ? Procedure: LAPAROSCOPIC CHOLECYSTECTOMY;  Surgeon: Judeth Horn, MD;  Location: [facility];  Service: General;  Laterality: N/A;  ? COLONOSCOPY WITH PROPOFOL N/A 01/22/2018  ? Procedure: COLONOSCOPY WITH PROPOFOL;  Surgeon: Wilford Corner, MD;  Location: [facility];  Service: Endoscopy;  Laterality: N/A;  ? ESOPHAGOGASTRODUODENOSCOPY (EGD) WITH PROPOFOL N/A 01/22/2018  ? Procedure: ESOPHAGOGASTRODUODENOSCOPY (EGD) WITH PROPOFOL;  Surgeon: Wilford Corner, MD;  Location: [facility];  Service: Endoscopy;  Laterality: N/A;  ? ESOPHAGOGASTRODUODENOSCOPY (EGD) WITH PROPOFOL N/A 11/21/2018  ? Procedure: ESOPHAGOGASTRODUODENOSCOPY (EGD) WITH PROPOFOL;  Surg

## 2022-01-02 NOTE — Patient Instructions (Signed)
Blood Transfusion, Adult, Care After ?This sheet gives you information about how to care for yourself after your procedure. Your health care provider may also give you more specific instructions. If you have problems or questions, contact your health care provider. ?What can I expect after the procedure? ?After the procedure, it is common to have: ?Bruising and soreness where the IV was inserted. ?A headache. ?Follow these instructions at home: ?IV insertion site care ?  ?Follow instructions from your health care provider about how to take care of your IV insertion site. Make sure you: ?Wash your hands with soap and water before and after you change your bandage (dressing). If soap and water are not available, use hand sanitizer. ?Change your dressing as told by your health care provider. ?Check your IV insertion site every day for signs of infection. Check for: ?Redness, swelling, or pain. ?Bleeding from the site. ?Warmth. ?Pus or a bad smell. ?General instructions ?Take over-the-counter and prescription medicines only as told by your health care provider. ?Rest as told by your health care provider. ?Return to your normal activities as told by your health care provider. ?Keep all follow-up visits as told by your health care provider. This is important. ?Contact a health care provider if: ?You have itching or red, swollen areas of skin (hives). ?You feel anxious. ?You feel weak after doing your normal activities. ?You have redness, swelling, warmth, or pain around the IV insertion site. ?You have blood coming from the IV insertion site that does not stop with pressure. ?You have pus or a bad smell coming from your IV insertion site. ?Get help right away if: ?You have symptoms of a serious allergic or immune system reaction, including: ?Trouble breathing or shortness of breath. ?Swelling of the face or feeling flushed. ?Fever or chills. ?Pain in the head, back, or chest. ?Dark urine or blood in the urine. ?Widespread  rash. ?Fast heartbeat. ?Feeling dizzy or light-headed. ?If you receive your blood transfusion in an outpatient setting, you will be told whom to contact to report any reactions. ?These symptoms may represent a serious problem that is an emergency. Do not wait to see if the symptoms will go away. Get medical help right away. Call your local emergency services (911 in the [place] Do not drive yourself to the hospital. ?Summary ?Bruising and tenderness around the IV insertion site are common. ?Check your IV insertion site every day for signs of infection. ?Rest as told by your health care provider. Return to your normal activities as told by your health care provider. ?Get help right away for symptoms of a serious allergic or immune system reaction to blood transfusion. ?This information is not intended to replace advice given to you by your health care provider. Make sure you discuss any questions you have with your health care provider. ?[facility]  Discharge Instructions: ?Thank you for choosing [facility] to provide your oncology and hematology care.  ? ?If you have a lab appointment with the [facility], please go directly to the [facility] and check in at the registration area. ?  ?Wear comfortable clothing and clothing appropriate for easy access to any Portacath or PICC line.  ? ?We strive to give you quality time with your provider. You may need to reschedule your appointment if you arrive late (15 or more minutes).  Arriving late affects you and other patients whose appointments are after yours.  Also, if you miss three or more appointments without  notifying the office, you may be dismissed from the clinic at the provider?s discretion.    ?  ?For prescription refill requests, have your pharmacy contact our office and allow 72 hours for refills to be completed.   ? ?Today you received the following chemotherapy and/or immunotherapy agents: Rituximab.    ?  ?To  help prevent nausea and vomiting after your treatment, we encourage you to take your nausea medication as directed. ? ?BELOW ARE SYMPTOMS THAT SHOULD BE REPORTED IMMEDIATELY: ?*FEVER GREATER THAN 100.4 F (38 ?C) OR HIGHER ?*CHILLS OR SWEATING ?*NAUSEA AND VOMITING THAT IS NOT CONTROLLED WITH YOUR NAUSEA MEDICATION ?*UNUSUAL SHORTNESS OF BREATH ?*UNUSUAL BRUISING OR BLEEDING ?*URINARY PROBLEMS (pain or burning when urinating, or frequent urination) ?*BOWEL PROBLEMS (unusual diarrhea, constipation, pain near the anus) ?TENDERNESS IN MOUTH AND THROAT WITH OR WITHOUT PRESENCE OF ULCERS (sore throat, sores in mouth, or a toothache) ?UNUSUAL RASH, SWELLING OR PAIN  ?UNUSUAL VAGINAL DISCHARGE OR ITCHING  ? ?Items with * indicate a potential emergency and should be followed up as soon as possible or go to the Emergency Department if any problems should occur. ? ?Please show the CHEMOTHERAPY ALERT CARD or IMMUNOTHERAPY ALERT CARD at check-in to the Emergency Department and triage nurse. ? ?Should you have questions after your visit or need to cancel or reschedule your appointment, please contact [facility]  Dept: [phone]  and follow the prompts.  Office hours are 8:00 a.m. to 4:30 p.m. Monday - Friday. Please note that voicemails left after 4:00 p.m. may not be returned until the following business day.  We are closed weekends and major holidays. You have access to a nurse at all times for urgent questions. Please call the main number to the clinic Dept: [phone] and follow the prompts. ? ? ?For any non-urgent questions, you may also contact your provider using MyChart. We now offer e-Visits for anyone 27 and older to request care online for non-urgent symptoms. For details visit [email or web address]. ?  ?Also download the MyChart app! Go to the app store, search "MyChart", open the app, select [facility], and log in with your MyChart username and password. ? ?Due to Covid, a mask  is required upon entering the hospital/clinic. If you do not have a mask, one will be given to you upon arrival. For doctor visits, patients may have 1 support person aged 32 or older with them. For treatment visits, patients cannot have anyone with them due to current Covid guidelines and our immunocompromised population.  ? ? ?Document Revised: 01/20/2021 Document Reviewed: 03/20/2019 ?Elsevier Patient Education ? [place]. ? ?

## 2022-01-02 NOTE — Assessment & Plan Note (Signed)
She has baseline 1+ bilateral lower extremity edema but today it appears to be 2+.  She is already on Lasix 80 mg once every morning.  I have asked her to take an additional dose but she was worried about her kidney function.  Hence have asked her to take 60 in the morning and may be 40 around 2 PM in the afternoon.  We will continue to monitor her lower extremity swelling.  This could be temporary because of additional fluids as well as prednisone which can cause fluid retention. ?

## 2022-01-03 ENCOUNTER — Encounter: Payer: Self-pay | Admitting: Podiatry

## 2022-01-03 ENCOUNTER — Ambulatory Visit: Payer: Medicare HMO | Admitting: Podiatry

## 2022-01-03 DIAGNOSIS — B351 Tinea unguium: Secondary | ICD-10-CM

## 2022-01-03 DIAGNOSIS — M79675 Pain in left toe(s): Secondary | ICD-10-CM | POA: Diagnosis not present

## 2022-01-03 DIAGNOSIS — E119 Type 2 diabetes mellitus without complications: Secondary | ICD-10-CM

## 2022-01-03 DIAGNOSIS — M79674 Pain in right toe(s): Secondary | ICD-10-CM | POA: Diagnosis not present

## 2022-01-03 LAB — TYPE AND SCREEN
ABO/RH(D): A POS
Antibody Screen: NEGATIVE
Unit division: 0

## 2022-01-03 LAB — BPAM RBC
Blood Product Expiration Date: 202304172359
ISSUE DATE / TIME: 202303271313
Unit Type and Rh: 6200

## 2022-01-05 ENCOUNTER — Inpatient Hospital Stay: Payer: Medicare HMO

## 2022-01-05 ENCOUNTER — Ambulatory Visit (INDEPENDENT_AMBULATORY_CARE_PROVIDER_SITE_OTHER): Payer: Medicare HMO

## 2022-01-05 DIAGNOSIS — Z7901 Long term (current) use of anticoagulants: Secondary | ICD-10-CM

## 2022-01-05 DIAGNOSIS — N184 Chronic kidney disease, stage 4 (severe): Secondary | ICD-10-CM | POA: Diagnosis not present

## 2022-01-05 DIAGNOSIS — Z952 Presence of prosthetic heart valve: Secondary | ICD-10-CM | POA: Diagnosis not present

## 2022-01-05 DIAGNOSIS — I7782 Antineutrophilic cytoplasmic antibody (ANCA) vasculitis: Secondary | ICD-10-CM | POA: Diagnosis not present

## 2022-01-05 DIAGNOSIS — M8580 Other specified disorders of bone density and structure, unspecified site: Secondary | ICD-10-CM | POA: Diagnosis not present

## 2022-01-05 DIAGNOSIS — D631 Anemia in chronic kidney disease: Secondary | ICD-10-CM | POA: Diagnosis not present

## 2022-01-05 DIAGNOSIS — Z5112 Encounter for antineoplastic immunotherapy: Secondary | ICD-10-CM | POA: Diagnosis not present

## 2022-01-05 LAB — CBC WITH DIFFERENTIAL/PLATELET
Abs Immature Granulocytes: 0.1 10*3/uL — ABNORMAL HIGH (ref 0.00–0.07)
Basophils Absolute: 0 10*3/uL (ref 0.0–0.1)
Basophils Relative: 0 %
Eosinophils Absolute: 0.1 10*3/uL (ref 0.0–0.5)
Eosinophils Relative: 1 %
HCT: 25.7 % — ABNORMAL LOW (ref 36.0–46.0)
Hemoglobin: 8.4 g/dL — ABNORMAL LOW (ref 12.0–15.0)
Immature Granulocytes: 1 %
Lymphocytes Relative: 3 %
Lymphs Abs: 0.3 10*3/uL — ABNORMAL LOW (ref 0.7–4.0)
MCH: 30.8 pg (ref 26.0–34.0)
MCHC: 32.7 g/dL (ref 30.0–36.0)
MCV: 94.1 fL (ref 80.0–100.0)
Monocytes Absolute: 0.1 10*3/uL (ref 0.1–1.0)
Monocytes Relative: 1 %
Neutro Abs: 9.3 10*3/uL — ABNORMAL HIGH (ref 1.7–7.7)
Neutrophils Relative %: 94 %
Platelets: 195 10*3/uL (ref 150–400)
RBC: 2.73 MIL/uL — ABNORMAL LOW (ref 3.87–5.11)
RDW: 17.7 % — ABNORMAL HIGH (ref 11.5–15.5)
WBC: 9.9 10*3/uL (ref 4.0–10.5)
nRBC: 0 % (ref 0.0–0.2)

## 2022-01-05 LAB — POCT INR: INR: 4.4 — AB (ref 2.0–3.0)

## 2022-01-05 LAB — SAMPLE TO BLOOD BANK

## 2022-01-05 NOTE — Patient Instructions (Addendum)
Description   ?- Hold Warfarin today  ?- 3/31 Friday: only take 0.5 tablet  ?- 4/1 Saturday: Take 1.5 tablets  ?- 4/2 Sunday: Only take 1 tablet  ?- 4/3 Monday: Resume taking Warfarin 1.5 tablets (7.'5mg'$ ) daily except 1 tablet on Mondays, Wednesdays, and Fridays.  ?Please keep green leafy veggies and boost in your diet.  ?Recheck INR in 1 week.  ? Call with any new meds [phone] & if you have any procedures where you need to hold warfarin, have them fax a clearance form to [phone]. ? ? ?  ?   ?

## 2022-01-06 ENCOUNTER — Other Ambulatory Visit: Payer: Self-pay

## 2022-01-06 DIAGNOSIS — I482 Chronic atrial fibrillation, unspecified: Secondary | ICD-10-CM | POA: Diagnosis not present

## 2022-01-06 DIAGNOSIS — D696 Thrombocytopenia, unspecified: Secondary | ICD-10-CM

## 2022-01-06 DIAGNOSIS — D649 Anemia, unspecified: Secondary | ICD-10-CM

## 2022-01-06 DIAGNOSIS — N184 Chronic kidney disease, stage 4 (severe): Secondary | ICD-10-CM | POA: Diagnosis not present

## 2022-01-06 DIAGNOSIS — I5032 Chronic diastolic (congestive) heart failure: Secondary | ICD-10-CM | POA: Diagnosis not present

## 2022-01-06 DIAGNOSIS — I130 Hypertensive heart and chronic kidney disease with heart failure and stage 1 through stage 4 chronic kidney disease, or unspecified chronic kidney disease: Secondary | ICD-10-CM | POA: Diagnosis not present

## 2022-01-06 DIAGNOSIS — D500 Iron deficiency anemia secondary to blood loss (chronic): Secondary | ICD-10-CM

## 2022-01-08 NOTE — Progress Notes (Signed)
?  Subjective:  ?Patient ID: [name], female    DOB: [birth year],  MRN: [id number] ? ?[name] presents to clinic today for at risk foot care. Pt has h/o NIDDM with PAD and painful elongated mycotic toenails 1-5 bilaterally which are tender when wearing enclosed shoe gear. Pain is relieved with periodic professional debridement. ? ?Last known HgA1c was 6.7%. Patient did not check blood glucose today. ? ?Her daughter is present during today's visit. ? ?Patient would like to discuss treatment for mycotic toenails. ? ?PCP is [name], [name], MD , and last visit was December 06, 2021. ? ?Allergies  ?Allergen Reactions  ? Flagyl [Metronidazole] Rash  ?  ALL-OVER BODY RASH  ? Ciprofloxacin   ?  Other reaction(s): Stomach upset - tolerates ?Other reaction(s): Stomach upset - tolerates  ? Coreg [Carvedilol] Other (See Comments)  ?  Terrible cramping in the feet and had a lot of bowel movements, but not diarrhea  ? Losartan Swelling  ?  Patient doesn't recall site of swelling  ? Sulfamethoxazole-Trimethoprim   ?  Other reaction(s): stomach upset .  ? Verapamil Hives  ? Zetia [Ezetimibe] Other (See Comments)  ?  Reaction not recalled  ? Zocor [Simvastatin - High Dose] Other (See Comments)  ?  Reaction not recalled  ? Gatifloxacin Rash  ?  Redness to skin around eye  ? ? ?Review of Systems: Negative except as noted in the HPI. ? ?Objective: No changes noted in today's physical examination. ? ?[name] is a pleasant 81 y.o. female in NAD. AAO X 3. ? ?Vascular Examination: ?CFT <3 seconds b/l LE. Faintly palpable DP pulses b/l LE. Faintly palpable PT pulse(s) b/l LE. Pedal hair absent. No pain with calf compression b/l. Lower extremity skin temperature gradient warm to cool. Patient wearing compression hose on today's visit. Pedal edema noted BLE. No ischemia or gangrene noted b/l LE. No cyanosis or clubbing noted b/l LE. ? ?Dermatological Examination: ?Pedal skin thin, shiny and atrophic b/l LE. No open  wounds b/l LE. No interdigital macerations noted b/l LE. Toenails 1-5 b/l elongated, discolored, dystrophic, thickened, crumbly with subungual debris and tenderness to dorsal palpation. Right great toenail also displays loose nail plate medial border which is also friable. No erythema, no edema, no drainage, no fluctuance. No hyperkeratotic nor porokeratotic lesions present on today's visit. ? ?Musculoskeletal Examination: ?Muscle strength 5/5 to all lower extremity muscle groups bilaterally. No pain, crepitus or joint limitation noted with ROM bilateral LE. Tailor's bunion deformity noted b/l LE. Patient ambulates independent of any assistive aids. Wearing open toed sandals with memory foam insoles and velcro straps which are adequate for her feet. ? ?Neurological Examination: ?Protective sensation intact 5/5 intact bilaterally with 10g monofilament b/l. Vibratory sensation intact b/l. ? ?Assessment/Plan: ?1. Pain due to onychomycosis of toenails of both feet   ?2. Controlled type 2 diabetes mellitus without complication, without long-term current use of insulin ([place])   ?-Examined patient. ?-Discussed treatment for onychomycosis. She is to start Funginail to affected toenails once daily. ?-Toenails 1-5 b/l were debrided in length and girth with sterile nail nippers and dremel without iatrogenic bleeding.  ?-Loose nailplate R hallux gently debrided to level of adherence. Digit cleansed with alcohol. triple antibiotic ointment applied to nailbed followed by light dressing. No further treatment required by patient. ?-Patient/POA to call should there be question/concern in the interim.  ? ?Return in about 9 weeks (around 03/07/2022). ? ?Marzetta Board, DPM  ?

## 2022-01-09 ENCOUNTER — Inpatient Hospital Stay: Payer: Medicare HMO

## 2022-01-09 ENCOUNTER — Other Ambulatory Visit: Payer: Self-pay

## 2022-01-09 ENCOUNTER — Telehealth: Payer: Self-pay | Admitting: *Deleted

## 2022-01-09 ENCOUNTER — Inpatient Hospital Stay: Payer: Medicare HMO | Attending: Oncology

## 2022-01-09 ENCOUNTER — Other Ambulatory Visit: Payer: Self-pay | Admitting: *Deleted

## 2022-01-09 ENCOUNTER — Encounter: Payer: Self-pay | Admitting: Hematology and Oncology

## 2022-01-09 ENCOUNTER — Inpatient Hospital Stay: Payer: Medicare HMO | Admitting: Hematology and Oncology

## 2022-01-09 VITALS — BP 147/45 | HR 68 | Temp 97.8°F | Resp 17

## 2022-01-09 DIAGNOSIS — Z5112 Encounter for antineoplastic immunotherapy: Secondary | ICD-10-CM | POA: Insufficient documentation

## 2022-01-09 DIAGNOSIS — I7782 Antineutrophilic cytoplasmic antibody (ANCA) vasculitis: Secondary | ICD-10-CM | POA: Diagnosis not present

## 2022-01-09 DIAGNOSIS — R739 Hyperglycemia, unspecified: Secondary | ICD-10-CM | POA: Diagnosis not present

## 2022-01-09 DIAGNOSIS — M8580 Other specified disorders of bone density and structure, unspecified site: Secondary | ICD-10-CM | POA: Insufficient documentation

## 2022-01-09 DIAGNOSIS — N184 Chronic kidney disease, stage 4 (severe): Secondary | ICD-10-CM | POA: Diagnosis not present

## 2022-01-09 DIAGNOSIS — D500 Iron deficiency anemia secondary to blood loss (chronic): Secondary | ICD-10-CM

## 2022-01-09 DIAGNOSIS — D649 Anemia, unspecified: Secondary | ICD-10-CM

## 2022-01-09 DIAGNOSIS — E1122 Type 2 diabetes mellitus with diabetic chronic kidney disease: Secondary | ICD-10-CM | POA: Insufficient documentation

## 2022-01-09 DIAGNOSIS — D631 Anemia in chronic kidney disease: Secondary | ICD-10-CM | POA: Insufficient documentation

## 2022-01-09 DIAGNOSIS — R079 Chest pain, unspecified: Secondary | ICD-10-CM

## 2022-01-09 DIAGNOSIS — D591 Autoimmune hemolytic anemia, unspecified: Secondary | ICD-10-CM | POA: Insufficient documentation

## 2022-01-09 DIAGNOSIS — R600 Localized edema: Secondary | ICD-10-CM | POA: Diagnosis not present

## 2022-01-09 DIAGNOSIS — D696 Thrombocytopenia, unspecified: Secondary | ICD-10-CM

## 2022-01-09 DIAGNOSIS — D61818 Other pancytopenia: Secondary | ICD-10-CM

## 2022-01-09 DIAGNOSIS — E119 Type 2 diabetes mellitus without complications: Secondary | ICD-10-CM

## 2022-01-09 LAB — CMP (CANCER CENTER ONLY)
ALT: 11 U/L (ref 0–44)
AST: 12 U/L — ABNORMAL LOW (ref 15–41)
Albumin: 3.1 g/dL — ABNORMAL LOW (ref 3.5–5.0)
Alkaline Phosphatase: 52 U/L (ref 38–126)
Anion gap: 9 (ref 5–15)
BUN: 100 mg/dL — ABNORMAL HIGH (ref 8–23)
CO2: 20 mmol/L — ABNORMAL LOW (ref 22–32)
Calcium: 7.9 mg/dL — ABNORMAL LOW (ref 8.9–10.3)
Chloride: 103 mmol/L (ref 98–111)
Creatinine: 2.26 mg/dL — ABNORMAL HIGH (ref 0.44–1.00)
GFR, Estimated: 21 mL/min — ABNORMAL LOW (ref >60)
Glucose, Bld: 423 mg/dL — ABNORMAL HIGH (ref 70–99)
Potassium: 4.3 mmol/L (ref 3.5–5.1)
Sodium: 132 mmol/L — ABNORMAL LOW (ref 135–145)
Total Bilirubin: 0.4 mg/dL (ref 0.3–1.2)
Total Protein: 5 g/dL — ABNORMAL LOW (ref 6.5–8.1)

## 2022-01-09 LAB — CBC WITH DIFFERENTIAL (CANCER CENTER ONLY)
Abs Immature Granulocytes: 0.14 10*3/uL — ABNORMAL HIGH (ref 0.00–0.07)
Basophils Absolute: 0 10*3/uL (ref 0.0–0.1)
Basophils Relative: 0 %
Eosinophils Absolute: 0.2 10*3/uL (ref 0.0–0.5)
Eosinophils Relative: 2 %
HCT: 17.2 % — ABNORMAL LOW (ref 36.0–46.0)
Hemoglobin: 5.8 g/dL — CL (ref 12.0–15.0)
Immature Granulocytes: 2 %
Lymphocytes Relative: 5 %
Lymphs Abs: 0.5 10*3/uL — ABNORMAL LOW (ref 0.7–4.0)
MCH: 32.2 pg (ref 26.0–34.0)
MCHC: 33.7 g/dL (ref 30.0–36.0)
MCV: 95.6 fL (ref 80.0–100.0)
Monocytes Absolute: 0.5 10*3/uL (ref 0.1–1.0)
Monocytes Relative: 6 %
Neutro Abs: 7.4 10*3/uL (ref 1.7–7.7)
Neutrophils Relative %: 85 %
Platelet Count: 142 10*3/uL — ABNORMAL LOW (ref 150–400)
RBC: 1.8 MIL/uL — ABNORMAL LOW (ref 3.87–5.11)
RDW: 17.2 % — ABNORMAL HIGH (ref 11.5–15.5)
WBC Count: 8.7 10*3/uL (ref 4.0–10.5)
nRBC: 0 % (ref 0.0–0.2)

## 2022-01-09 LAB — LACTATE DEHYDROGENASE: LDH: 162 U/L (ref 98–192)

## 2022-01-09 LAB — PREPARE RBC (CROSSMATCH)

## 2022-01-09 LAB — RETICULOCYTES
Immature Retic Fract: 16.5 % — ABNORMAL HIGH (ref 2.3–15.9)
RBC.: 1.83 MIL/uL — ABNORMAL LOW (ref 3.87–5.11)
Retic Count, Absolute: 154.6 10*3/uL (ref 19.0–186.0)
Retic Ct Pct: 8.5 % — ABNORMAL HIGH (ref 0.4–3.1)

## 2022-01-09 LAB — GLUCOSE, CAPILLARY: Glucose-Capillary: 456 mg/dL — ABNORMAL HIGH (ref 70–99)

## 2022-01-09 MED ORDER — INSULIN ASPART 100 UNIT/ML IJ SOLN
9.0000 [IU] | Freq: Once | INTRAMUSCULAR | Status: AC
  Administered 2022-01-09: 9 [IU] via SUBCUTANEOUS
  Filled 2022-01-09: qty 1

## 2022-01-09 MED ORDER — SODIUM CHLORIDE 0.9% FLUSH: 10.0000 mL | INTRAVENOUS | Status: DC | PRN

## 2022-01-09 MED ORDER — ACETAMINOPHEN 325 MG PO TABS
650.0000 mg | ORAL_TABLET | Freq: Once | ORAL | Status: AC
  Administered 2022-01-09: 650 mg via ORAL
  Filled 2022-01-09: qty 2

## 2022-01-09 MED ORDER — SODIUM CHLORIDE 0.9 % IV SOLN: Freq: Once | INTRAVENOUS | Status: AC

## 2022-01-09 MED ORDER — DIPHENHYDRAMINE HCL 25 MG PO CAPS
25.0000 mg | ORAL_CAPSULE | Freq: Once | ORAL | Status: AC
  Administered 2022-01-09: 25 mg via ORAL
  Filled 2022-01-09: qty 1

## 2022-01-09 MED ORDER — GLIPIZIDE 5 MG PO TABS: 5.0000 mg | ORAL_TABLET | Freq: Every day | ORAL | 0 refills | Status: DC

## 2022-01-09 MED ORDER — EPOETIN ALFA-EPBX 20000 UNIT/ML IJ SOLN
20000.0000 [IU] | Freq: Once | INTRAMUSCULAR | Status: AC
  Administered 2022-01-09: 20000 [IU] via SUBCUTANEOUS
  Filled 2022-01-09: qty 1

## 2022-01-09 MED ORDER — SODIUM CHLORIDE 0.9 % IV SOLN
375.0000 mg/m2 | Freq: Once | INTRAVENOUS | Status: AC
  Administered 2022-01-09: 600 mg via INTRAVENOUS
  Filled 2022-01-09: qty 50

## 2022-01-09 MED ORDER — EPOETIN ALFA-EPBX 40000 UNIT/ML IJ SOLN
40000.0000 [IU] | Freq: Once | INTRAMUSCULAR | Status: AC
  Administered 2022-01-09: 40000 [IU] via SUBCUTANEOUS
  Filled 2022-01-09: qty 1

## 2022-01-09 NOTE — Assessment & Plan Note (Signed)
Hyperglycemia likely secondary to steroid use.  Will engage PCP team to help assist in the management of hyperglycemia. ?

## 2022-01-09 NOTE — Telephone Encounter (Signed)
Per discussion with pt who presently declines " giving myself injections " - she is agreeable to glipizide orally- of note she states she has been on it previously with good tolerance. ? ?Prescription sent to her pharmacy.  ?

## 2022-01-09 NOTE — Progress Notes (Signed)
Per Dr. Chryl Heck patient will receive 1 unit of blood today and 1 unit tomorrow in addition to receiving the Rituxan infusion today. Fingerstick recheck of the glucose resulted at 456 at 13:22 and this was verbally reported to [place] ?

## 2022-01-09 NOTE — Progress Notes (Signed)
This RN entered orders per MD for 2 units RBC's - note sample was not taken with lab this AM. ? ?Lab contacted with request for 2nd lab draw from pt while in infusion. ? ?

## 2022-01-09 NOTE — Assessment & Plan Note (Signed)
This is essentially stable compared to last visit. ?

## 2022-01-09 NOTE — Progress Notes (Signed)
CBG 492.  MD made aware ?

## 2022-01-09 NOTE — Progress Notes (Signed)
Per blood sugar readings in this office- called placed to Dr Loren Racer for management. ? ?Order obtained for insulin to be given while at the clinic. ? ?

## 2022-01-09 NOTE — Progress Notes (Signed)
[facility]: ?  ? ?[name], [name], MD ?[address] ?[place] [address] ? ?DIAGNOSIS:  ? ?SUMMARY OF HEMATOLOGIC HISTORY: ?81 y.o. [name] woman with multifactorial anemia, as follows: ?            (a) anemia of chronic inflammation: As of May 2020 she has a SED rate of 95, CRP 1.1, positive ANA and RF; subsequently she was found to be ANCA positive ?            (b) hemolysis (mild): she has a mildly elevated LDH and a DAT positive for complement, not IgG; this is c/w (a) above ?            (c) anemia of renal insufficiency: inadequate EPO resonse to anemia    ?                     (d) GIB/ iron deficiency in patient on lifelong anticoagulaton ?  ?(1) Feraheme received 03/01/2019, repeated 03/20/2019  ?            (a) reticulocyte 03/07/2019 up from 43.4 a year ago to 191.8 after iron infusion ?            (b) feraheme repeated on 05/12/2019 ?            (c) subsequent ferritin levels >100 ?  ?(2) darbepoetin: starting 05/05/2019 at 113mg given every 2 weeks ?            (a) dose increased to 200 mcg every week starting 07/01/2019  ?            (b) back to every 2 weeks beginning 08/04/2019 still at 200 mcg dose ?            (c) switched to Retacrit every 4 weeks as of 08/18/2019 ?  ?(3) ANCA positive vasculitis: diagnosed 05/08/2019 (titer 1:640) ?            (a) prednisone 40 mg daily started 05/31/2019 ?            (b) rituximab weekly started 06/12/2019 (received test dose 06/05/2019) ?                        (i) hepatitis B sAg, core Ab and HIV negative 05/08/2019 ?                        (ii) rituximab held after 07/14/2019 dose (received 5 weekly doses) with neutropenia ?                        (iii) rituximab resumed 08/18/2019 but changed to monthly ?                        (iv) rituximab changed to every 2 months beginning with 02/02/2020 dose ?                        (v) rituximab changed to every 12 weeks after the 10/12/2020 dose ?            (c) prednisone  tapered to off as of 09/15/2019 ?  ?(4) osteopenia with a T score of -1.5 on bone density 02/28/2016 at the [facility] center ? ?CURRENT THERAPY: Rituximab ? ?INTERVAL HISTORY: ? ?[name] 81y.o. female returns for her follow-up of anemia. ?  [name] today complains of nausea, chest pain and shortness of breath.  She was seen in the infusion.  She has been taking prednisone 20 mg p.o. daily as recommended part of the taper.  She has otherwise been tolerating rituximab very well. ?She tells me that today she felt so weak, she could not even fix her breakfast.  She also feels like her abdomen is bloated, had some semiformed stools yesterday but today morning she had a decent bowel movement.  She denies any nausea or vomiting, fevers or chills.  No change in urinary habits.  She had an EKG done at the infusion which did not show any evidence of acute cardiac events. ? ?Rest of  the pertinent 10 point ROS reviewed and negative. ? ? ?Patient Active Problem List  ? Diagnosis Date Noted  ? Hyperglycemia 01/09/2022  ? Autoimmune hemolytic anemia ([place]) 01/02/2022  ? Bilateral lower extremity edema 01/02/2022  ? Cold sore 01/02/2022  ? Symptomatic anemia 12/15/2021  ? Abnormal chest x-ray 12/15/2021  ? Supratherapeutic INR 12/15/2021  ? Elevated troponin 12/15/2021  ? Unilateral primary osteoarthritis, right hip 05/31/2021  ? Midline cystocele 05/30/2021  ? Primary localized osteoarthritis of pelvic region and thigh 05/30/2021  ? Vaginal pain 05/30/2021  ? Asymmetric SNHL (sensorineural hearing loss) 01/06/2021  ? Referred otalgia of both ears 01/06/2021  ? Bilateral hearing loss 08/31/2020  ? Bilateral impacted cerumen 08/31/2020  ? Low back pain 02/20/2020  ? CKD (chronic kidney disease), stage IV ([place]) 10/13/2019  ? Leukopenia due to antineoplastic chemotherapy ([place]) 08/18/2019  ? Chemotherapy induced neutropenia ([place]) 07/21/2019  ? Thrombophilia ([place]) 06/19/2019  ? Encounter for general adult medical examination  without abnormal findings 06/13/2019  ? Arteritis ([place]) 06/06/2019  ? Hypertensive heart and chronic kidney disease with heart failure and stage 1 through stage 4 chronic kidney disease, or unspecified chronic kidney disease ([place]) 06/06/2019  ? Muscle weakness 06/06/2019  ? Pancytopenia ([place]) 05/28/2019  ? Dyspnea 05/28/2019  ? Vasculitis, ANCA positive 05/28/2019  ? Disorder of connective tissue ([place]) 05/14/2019  ? Renal insufficiency 05/08/2019  ? Acute on chronic diastolic CHF (congestive heart failure) ([place]) 04/07/2019  ? Left leg cellulitis 04/07/2019  ? PAF (paroxysmal atrial fibrillation) ([place]) 04/07/2019  ? Iron deficiency anemia 02/28/2019  ? 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 ([place]) 02/28/2019  ? Diabetic retinopathy associated with type 2 diabetes mellitus ([place]) 09/02/2018  ? Chronic pain 06/19/2018  ? Non-thrombocytopenic purpura ([place]) 05/21/2018  ? Gout 03/22/2018  ? Malnutrition of moderate degree 03/12/2018  ? Acute on chronic diastolic heart failure ([place]) 03/06/2018  ? Diabetes mellitus type 2, controlled ([place]) 03/06/2018  ? S/P cholecystectomy 03/06/2018  ? HTN (hypertension) 03/06/2018  ? H/O mechanical aortic valve replacement 03/06/2018  ? Moderate to severe pulmonary hypertension ([place]) 03/06/2018  ? GERD (gastroesophageal reflux disease) 02/21/2018  ? Anxiety 02/21/2018  ? Chronic diastolic CHF (congestive heart failure) ([place]) 02/21/2018  ? Diarrhea 02/21/2018  ? General weakness 02/02/2018  ? Thrombocytopenia ([place]) 01/25/2018  ? Arm pain, diffuse 01/25/2018  ? Anemia 01/23/2018  ? Cholelithiasis 01/23/2018  ? Aortic atherosclerosis ([place]) 01/23/2018  ? GI bleed 01/18/2018  ? Age-related osteoporosis without current pathological fracture 05/24/2017  ? Carotid artery occlusion 05/24/2017  ? Generalized anxiety disorder 05/24/2017  ? Hearing loss 05/24/2017  ? Presence of prosthetic heart valve 05/24/2017  ? Coronary artery disease 08/18/2013  ? Carotid artery disease  ([place]) 08/18/2013  ? Peripheral arterial disease ([place]) 08/18/2013  ? Diabetes mellitus without  complication ([place]) 18/56/3149  ? Benign positional vertigo 10/06/2011  ? Hyponatremia 10/06/2011  ? Hypertension   ? Hypercholesteremia   ? Mechanical heart valve present   ? Chronic anticoagulation   ? ? ?is allergic to flagyl [metronidazole], ciprofloxacin, coreg [carvedilol], losartan, sulfamethoxazole-trimethoprim, verapamil, zetia [ezetimibe], zocor [simvastatin - high dose], and gatifloxacin. ? ?MEDICAL HISTORY: ?Past Medical History:  ?Diagnosis Date  ? Aortic atherosclerosis ([place]) 01/23/2018  ? Atrial fibrillation ([place])   ? Carotid artery disease ([place])   ? Cholelithiasis 01/23/2018  ? Chronic anticoagulation   ? Coronary artery disease   ? status post coronary artery bypass grafting times 07/10/2004  ? Diabetes mellitus   ? Hypercholesteremia   ? Hypertension   ? Mechanical heart valve present   ? H. aortic valve replacement at the time of bypass surgery October 2005  ? Moderate to severe pulmonary hypertension ([facility])   ? Peripheral arterial disease ([place])   ? history of left common iliac artery PTA and stenting for a chronic total occlusion 08/26/01  ? ? ?SURGICAL HISTORY: ?Past Surgical History:  ?Procedure Laterality Date  ? anterior repair  2009  ? AORTIC VALVE REPLACEMENT  2005  ? Dr. Cyndia Bent  ? CARDIAC CATHETERIZATION  11/10/2004  ? 40% right common illiac, 70% in stent restenosis of distal left common illiac,   ? CARDIAC CATHETERIZATION  05/18/2004  ? LAD 50-70% midstenosis, RCA dominant w/50% stenosis, 50% Right common Illiac artery ostial stenosis, 90% in stent restenosis within midportion of left common illiac stent  ? Carotid Duplex  03/12/2012  ? RSA-elev. velocities suggestive of a 50-69% diameter reduction, Right&Left Bulb/Prox ICA-mild-mod.fibrous plaqueelevating Velocities abnormal study.  ? CHOLECYSTECTOMY N/A 03/01/2018  ? Procedure: LAPAROSCOPIC CHOLECYSTECTOMY;  Surgeon: Judeth Horn, MD;  Location: [facility];  Service: General;  Laterality: N/A;  ? COLONOSCOPY WITH PROPOFOL N/A 01/22/2018  ? Procedure: COLONOSCOPY WITH PROPOFOL;  Surgeon: Wilford Corner, MD;  Location: [facility];  Service: Endoscopy;  Ladean Raya

## 2022-01-09 NOTE — Assessment & Plan Note (Signed)
Her anemia is likely multifactorial but in the past she had ANCA positive hemolytic anemia maintained on rituximab had an exacerbation hence she is currently back on weekly rituximab and prednisone.  During her last visit, we have discussed about tapering prednisone since her hemoglobin has improved and her hemolysis has improved as well.  However today she complains of worsening shortness of breath, nausea, fatigue and her hemoglobin is 5.8 g/dL.  Reticulocyte count has increased again consistent with worsening hemolysis.  We will bump up her prednisone back to 40 mg daily with PPI prophylaxis.  She will continue rituximab weekly infusions for at least 2 or 3 weeks. ?If she does not respond to rituximab by the end of the weekly infusions, then we will have to consider an alternative immunosuppressant. ?She will also receive 3 units of packed red blood cell transfusion preferably 1 today and 1 tomorrow.  She was encouraged to go to the hospital if she continues to feel worse at the end of these transfusions for expedited work-up. ?

## 2022-01-09 NOTE — Progress Notes (Signed)
Patient c/o chest pain, dizziness, and general weakness for the last few days.  Dr. Chryl Heck made aware and will see patient in infusion.  12-lead EKG ordered ?

## 2022-01-09 NOTE — Progress Notes (Signed)
Per Marlon Pel RN, pt ok to d/c home with CBG of 492.  Pt is aware of follow up needs/instructions and verbalized understanding of importance.  Pt was reminded to ensure blue blood bracelet remains on wrist and is aware of follow up appointment.  Pt taken to lobby in wheelchair ?

## 2022-01-09 NOTE — Patient Instructions (Signed)
[facility]  Discharge Instructions: ?Thank you for choosing [facility] to provide your oncology and hematology care.  ? ?If you have a lab appointment with the [facility], please go directly to the [facility] and check in at the registration area. ?  ?Wear comfortable clothing and clothing appropriate for easy access to any Portacath or PICC line.  ? ?We strive to give you quality time with your provider. You may need to reschedule your appointment if you arrive late (15 or more minutes).  Arriving late affects you and other patients whose appointments are after yours.  Also, if you miss three or more appointments without notifying the office, you may be dismissed from the clinic at the provider?s discretion.    ?  ?For prescription refill requests, have your pharmacy contact our office and allow 72 hours for refills to be completed.   ? ?Today you received the following chemotherapy and/or immunotherapy agents : Rituxan    ?  ?To help prevent nausea and vomiting after your treatment, we encourage you to take your nausea medication as directed. ? ?BELOW ARE SYMPTOMS THAT SHOULD BE REPORTED IMMEDIATELY: ?*FEVER GREATER THAN 100.4 F (38 ?C) OR HIGHER ?*CHILLS OR SWEATING ?*NAUSEA AND VOMITING THAT IS NOT CONTROLLED WITH YOUR NAUSEA MEDICATION ?*UNUSUAL SHORTNESS OF BREATH ?*UNUSUAL BRUISING OR BLEEDING ?*URINARY PROBLEMS (pain or burning when urinating, or frequent urination) ?*BOWEL PROBLEMS (unusual diarrhea, constipation, pain near the anus) ?TENDERNESS IN MOUTH AND THROAT WITH OR WITHOUT PRESENCE OF ULCERS (sore throat, sores in mouth, or a toothache) ?UNUSUAL RASH, SWELLING OR PAIN  ?UNUSUAL VAGINAL DISCHARGE OR ITCHING  ? ?Items with * indicate a potential emergency and should be followed up as soon as possible or go to the Emergency Department if any problems should occur. ? ?Please show the CHEMOTHERAPY ALERT CARD or IMMUNOTHERAPY ALERT CARD at check-in to  the Emergency Department and triage nurse. ? ?Should you have questions after your visit or need to cancel or reschedule your appointment, please contact [facility]  Dept: [phone]  and follow the prompts.  Office hours are 8:00 a.m. to 4:30 p.m. Monday - Friday. Please note that voicemails left after 4:00 p.m. may not be returned until the following business day.  We are closed weekends and major holidays. You have access to a nurse at all times for urgent questions. Please call the main number to the clinic Dept: [phone] and follow the prompts. ? ? ?For any non-urgent questions, you may also contact your provider using MyChart. We now offer e-Visits for anyone 73 and older to request care online for non-urgent symptoms. For details visit [email or web address]. ?  ?Also download the MyChart app! Go to the app store, search "MyChart", open the app, select [facility], and log in with your MyChart username and password. ? ?Due to Covid, a mask is required upon entering the hospital/clinic. If you do not have a mask, one will be given to you upon arrival. For doctor visits, patients may have 1 support person aged 75 or older with them. For treatment visits, patients cannot have anyone with them due to current Covid guidelines and our immunocompromised population.  ? ?Blood Transfusion, Adult, Care After ?This sheet gives you information about how to care for yourself after your procedure. Your doctor may also give you more specific instructions. If you have problems or questions, contact your doctor. ?What can I expect after the procedure? ?After the procedure, it  is common to have: ?Bruising and soreness at the IV site. ?A headache. ?Follow these instructions at home: ?Insertion site care ?  ?Follow instructions from your doctor about how to take care of your insertion site. This is where an IV tube was put into your vein. Make sure you: ?Wash your hands with soap and water  before and after you change your bandage (dressing). If you cannot use soap and water, use hand sanitizer. ?Change your bandage as told by your doctor. ?Check your insertion site every day for signs of infection. Check for: ?Redness, swelling, or pain. ?Bleeding from the site. ?Warmth. ?Pus or a bad smell. ?General instructions ?Take over-the-counter and prescription medicines only as told by your doctor. ?Rest as told by your doctor. ?Go back to your normal activities as told by your doctor. ?Keep all follow-up visits as told by your doctor. This is important. ?Contact a doctor if: ?You have itching or red, swollen areas of skin (hives). ?You feel worried or nervous (anxious). ?You feel weak after doing your normal activities. ?You have redness, swelling, warmth, or pain around the insertion site. ?You have blood coming from the insertion site, and the blood does not stop with pressure. ?You have pus or a bad smell coming from the insertion site. ?Get help right away if: ?You have signs of a serious reaction. This may be coming from an allergy or the body's defense system (immune system). Signs include: ?Trouble breathing or shortness of breath. ?Swelling of the face or feeling warm (flushed). ?Fever or chills. ?Head, chest, or back pain. ?Dark pee (urine) or blood in the pee. ?Widespread rash. ?Fast heartbeat. ?Feeling dizzy or light-headed. ?You may receive your blood transfusion in an outpatient setting. If so, you will be told whom to contact to report any reactions. ?These symptoms may be an emergency. Do not wait to see if the symptoms will go away. Get medical help right away. Call your local emergency services (911 in the [place] Do not drive yourself to the hospital. ?Summary ?Bruising and soreness at the IV site are common. ?Check your insertion site every day for signs of infection. ?Rest as told by your doctor. Go back to your normal activities as told by your doctor. ?Get help right away if you have  signs of a serious reaction. ?This information is not intended to replace advice given to you by your health care provider. Make sure you discuss any questions you have with your health care provider. ?Document Revised: 01/20/2021 Document Reviewed: 03/20/2019 ?Elsevier Patient Education ? [place]. ? ?

## 2022-01-09 NOTE — Telephone Encounter (Signed)
This RN has not received call back from Dr Loren Racer per need for management of her diabetes with elevated blood sugars secondary to steroids for her hemolytic anemia. ? ?Per additional calls x 3 and speaking to the receptionist stating need for person to person communication- was transferred to VM's. ?Per 3rd call receptionist stated she would " make sure [name] gets the message " and again transferred to [name]'s VM. ? ?This RN left a 2nd detailed message per need of assistance with management of pt's known diabetes. ?( Pt has been on subque insulin in the past under [name]). ?RN's direct desk phone number given for return call ?

## 2022-01-10 ENCOUNTER — Other Ambulatory Visit: Payer: Self-pay | Admitting: *Deleted

## 2022-01-10 ENCOUNTER — Inpatient Hospital Stay: Payer: Medicare HMO

## 2022-01-10 ENCOUNTER — Emergency Department (HOSPITAL_COMMUNITY)
Admission: EM | Admit: 2022-01-10 | Discharge: 2022-01-11 | Disposition: A | Discharge status: Home / Self Care | Payer: Medicare HMO | Attending: Emergency Medicine | Admitting: Emergency Medicine

## 2022-01-10 ENCOUNTER — Telehealth: Payer: Self-pay | Admitting: *Deleted

## 2022-01-10 ENCOUNTER — Ambulatory Visit: Payer: Self-pay

## 2022-01-10 ENCOUNTER — Encounter (HOSPITAL_COMMUNITY): Payer: Self-pay | Admitting: Emergency Medicine

## 2022-01-10 ENCOUNTER — Other Ambulatory Visit: Payer: Self-pay | Admitting: Hematology and Oncology

## 2022-01-10 VITALS — BP 166/48 | HR 70 | Temp 98.0°F | Resp 18

## 2022-01-10 DIAGNOSIS — N189 Chronic kidney disease, unspecified: Secondary | ICD-10-CM | POA: Diagnosis not present

## 2022-01-10 DIAGNOSIS — D649 Anemia, unspecified: Secondary | ICD-10-CM

## 2022-01-10 DIAGNOSIS — E119 Type 2 diabetes mellitus without complications: Secondary | ICD-10-CM

## 2022-01-10 DIAGNOSIS — I2510 Atherosclerotic heart disease of native coronary artery without angina pectoris: Secondary | ICD-10-CM | POA: Insufficient documentation

## 2022-01-10 DIAGNOSIS — Z7984 Long term (current) use of oral hypoglycemic drugs: Secondary | ICD-10-CM | POA: Diagnosis not present

## 2022-01-10 DIAGNOSIS — E1122 Type 2 diabetes mellitus with diabetic chronic kidney disease: Secondary | ICD-10-CM | POA: Insufficient documentation

## 2022-01-10 DIAGNOSIS — I509 Heart failure, unspecified: Secondary | ICD-10-CM | POA: Diagnosis not present

## 2022-01-10 DIAGNOSIS — E1165 Type 2 diabetes mellitus with hyperglycemia: Secondary | ICD-10-CM | POA: Insufficient documentation

## 2022-01-10 DIAGNOSIS — R739 Hyperglycemia, unspecified: Secondary | ICD-10-CM

## 2022-01-10 DIAGNOSIS — D500 Iron deficiency anemia secondary to blood loss (chronic): Secondary | ICD-10-CM

## 2022-01-10 DIAGNOSIS — Z79899 Other long term (current) drug therapy: Secondary | ICD-10-CM | POA: Diagnosis not present

## 2022-01-10 DIAGNOSIS — I130 Hypertensive heart and chronic kidney disease with heart failure and stage 1 through stage 4 chronic kidney disease, or unspecified chronic kidney disease: Secondary | ICD-10-CM | POA: Diagnosis not present

## 2022-01-10 DIAGNOSIS — Z7901 Long term (current) use of anticoagulants: Secondary | ICD-10-CM | POA: Diagnosis not present

## 2022-01-10 LAB — URINALYSIS, COMPLETE (UACMP) WITH MICROSCOPIC
Bilirubin Urine: NEGATIVE
Glucose, UA: >=500 mg/dL — AB
Hgb urine dipstick: NEGATIVE
Ketones, ur: NEGATIVE mg/dL
Leukocytes,Ua: NEGATIVE
Nitrite: NEGATIVE
Protein, ur: 100 mg/dL — AB
Specific Gravity, Urine: 1.008 (ref 1.005–1.030)
pH: 5 (ref 5.0–8.0)

## 2022-01-10 LAB — COMPREHENSIVE METABOLIC PANEL
ALT: 18 U/L (ref 0–44)
AST: 28 U/L (ref 15–41)
Albumin: 3.3 g/dL — ABNORMAL LOW (ref 3.5–5.0)
Alkaline Phosphatase: 57 U/L (ref 38–126)
Anion gap: 10 (ref 5–15)
BUN: 99 mg/dL — ABNORMAL HIGH (ref 8–23)
CO2: 18 mmol/L — ABNORMAL LOW (ref 22–32)
Calcium: 7.9 mg/dL — ABNORMAL LOW (ref 8.9–10.3)
Chloride: 101 mmol/L (ref 98–111)
Creatinine, Ser: 2.25 mg/dL — ABNORMAL HIGH (ref 0.44–1.00)
GFR, Estimated: 21 mL/min — ABNORMAL LOW (ref >60)
Glucose, Bld: 488 mg/dL — ABNORMAL HIGH (ref 70–99)
Potassium: 4.3 mmol/L (ref 3.5–5.1)
Sodium: 129 mmol/L — ABNORMAL LOW (ref 135–145)
Total Bilirubin: 0.7 mg/dL (ref 0.3–1.2)
Total Protein: 5.8 g/dL — ABNORMAL LOW (ref 6.5–8.1)

## 2022-01-10 LAB — CBC WITH DIFFERENTIAL/PLATELET
Abs Immature Granulocytes: 0.18 10*3/uL — ABNORMAL HIGH (ref 0.00–0.07)
Basophils Absolute: 0 10*3/uL (ref 0.0–0.1)
Basophils Relative: 0 %
Eosinophils Absolute: 0 10*3/uL (ref 0.0–0.5)
Eosinophils Relative: 0 %
HCT: 26.1 % — ABNORMAL LOW (ref 36.0–46.0)
Hemoglobin: 8.7 g/dL — ABNORMAL LOW (ref 12.0–15.0)
Immature Granulocytes: 2 %
Lymphocytes Relative: 3 %
Lymphs Abs: 0.3 10*3/uL — ABNORMAL LOW (ref 0.7–4.0)
MCH: 30.6 pg (ref 26.0–34.0)
MCHC: 33.3 g/dL (ref 30.0–36.0)
MCV: 91.9 fL (ref 80.0–100.0)
Monocytes Absolute: 0.2 10*3/uL (ref 0.1–1.0)
Monocytes Relative: 2 %
Neutro Abs: 10.2 10*3/uL — ABNORMAL HIGH (ref 1.7–7.7)
Neutrophils Relative %: 93 %
Platelets: 155 10*3/uL (ref 150–400)
RBC: 2.84 MIL/uL — ABNORMAL LOW (ref 3.87–5.11)
RDW: 19.6 % — ABNORMAL HIGH (ref 11.5–15.5)
WBC: 10.9 10*3/uL — ABNORMAL HIGH (ref 4.0–10.5)
nRBC: 0.6 % — ABNORMAL HIGH (ref 0.0–0.2)

## 2022-01-10 LAB — CMP (CANCER CENTER ONLY)
ALT: 13 U/L (ref 0–44)
AST: 20 U/L (ref 15–41)
Albumin: 3.2 g/dL — ABNORMAL LOW (ref 3.5–5.0)
Alkaline Phosphatase: 56 U/L (ref 38–126)
Anion gap: 9 (ref 5–15)
BUN: 98 mg/dL — ABNORMAL HIGH (ref 8–23)
CO2: 21 mmol/L — ABNORMAL LOW (ref 22–32)
Calcium: 7.7 mg/dL — ABNORMAL LOW (ref 8.9–10.3)
Chloride: 97 mmol/L — ABNORMAL LOW (ref 98–111)
Creatinine: 2.41 mg/dL — ABNORMAL HIGH (ref 0.44–1.00)
GFR, Estimated: 19 mL/min — ABNORMAL LOW (ref >60)
Glucose, Bld: 621 mg/dL (ref 70–99)
Potassium: 5 mmol/L (ref 3.5–5.1)
Sodium: 127 mmol/L — ABNORMAL LOW (ref 135–145)
Total Bilirubin: 0.4 mg/dL (ref 0.3–1.2)
Total Protein: 5.1 g/dL — ABNORMAL LOW (ref 6.5–8.1)

## 2022-01-10 LAB — MAGNESIUM: Magnesium: 1.9 mg/dL (ref 1.7–2.4)

## 2022-01-10 LAB — CBG MONITORING, ED
Glucose-Capillary: 497 mg/dL — ABNORMAL HIGH (ref 70–99)
Glucose-Capillary: 380 mg/dL — ABNORMAL HIGH (ref 70–99)
Glucose-Capillary: 344 mg/dL — ABNORMAL HIGH (ref 70–99)

## 2022-01-10 LAB — GLUCOSE, CAPILLARY: Glucose-Capillary: 492 mg/dL — ABNORMAL HIGH (ref 70–99)

## 2022-01-10 LAB — OSMOLALITY: Osmolality: 327 mOsm/kg (ref 275–295)

## 2022-01-10 LAB — BETA-HYDROXYBUTYRIC ACID: Beta-Hydroxybutyric Acid: 0.17 mmol/L (ref 0.05–0.27)

## 2022-01-10 MED ORDER — ACETAMINOPHEN 325 MG PO TABS
650.0000 mg | ORAL_TABLET | Freq: Once | ORAL | Status: AC
  Administered 2022-01-10: 650 mg via ORAL
  Filled 2022-01-10: qty 2

## 2022-01-10 MED ORDER — INSULIN ASPART 100 UNIT/ML IJ SOLN
10.0000 [IU] | Freq: Once | INTRAMUSCULAR | Status: AC
  Administered 2022-01-10: 10 [IU] via SUBCUTANEOUS
  Filled 2022-01-10: qty 0.1

## 2022-01-10 MED ORDER — INSULIN ASPART 100 UNIT/ML IJ SOLN
10.0000 [IU] | Freq: Once | INTRAMUSCULAR | Status: AC
  Administered 2022-01-10: 10 [IU] via SUBCUTANEOUS
  Filled 2022-01-10: qty 1

## 2022-01-10 MED ORDER — ATORVASTATIN CALCIUM 40 MG PO TABS
80.0000 mg | ORAL_TABLET | Freq: Every day | ORAL | Status: DC
  Administered 2022-01-10: 80 mg via ORAL
  Filled 2022-01-10: qty 2

## 2022-01-10 MED ORDER — LACTATED RINGERS IV BOLUS
1000.0000 mL | Freq: Once | INTRAVENOUS | Status: AC
  Administered 2022-01-10: 1000 mL via INTRAVENOUS

## 2022-01-10 MED ORDER — INSULIN ASPART 100 UNIT/ML IJ SOLN
10.0000 [IU] | Freq: Once | INTRAMUSCULAR | Status: AC
  Administered 2022-01-11: 10 [IU] via SUBCUTANEOUS
  Filled 2022-01-10: qty 0.1

## 2022-01-10 MED ORDER — WARFARIN SODIUM 7.5 MG PO TABS: 7.5000 mg | ORAL_TABLET | ORAL | Status: DC

## 2022-01-10 MED ORDER — TADALAFIL 20 MG PO TABS
40.0000 mg | ORAL_TABLET | Freq: Every day | ORAL | Status: DC
  Administered 2022-01-10: 40 mg via ORAL
  Filled 2022-01-10: qty 2

## 2022-01-10 MED ORDER — SODIUM CHLORIDE 0.9% IV SOLUTION
250.0000 mL | Freq: Once | INTRAVENOUS | Status: AC
  Administered 2022-01-10: 250 mL via INTRAVENOUS

## 2022-01-10 MED ORDER — HYDRALAZINE HCL 50 MG PO TABS
100.0000 mg | ORAL_TABLET | Freq: Three times a day (TID) | ORAL | Status: DC
Start: 2022-01-10 — End: 2022-01-11
  Administered 2022-01-10: 100 mg via ORAL
  Filled 2022-01-10 (×2): qty 2

## 2022-01-10 MED ORDER — DIPHENHYDRAMINE HCL 25 MG PO CAPS
25.0000 mg | ORAL_CAPSULE | Freq: Once | ORAL | Status: AC
  Administered 2022-01-10: 25 mg via ORAL
  Filled 2022-01-10: qty 1

## 2022-01-10 MED ORDER — CLONIDINE HCL 0.1 MG PO TABS
0.2000 mg | ORAL_TABLET | Freq: Two times a day (BID) | ORAL | Status: DC
Start: 2022-01-10 — End: 2022-01-11
  Administered 2022-01-10: 0.2 mg via ORAL
  Filled 2022-01-10: qty 2

## 2022-01-10 NOTE — Progress Notes (Signed)
Informed by treatment room nurse of fingerstick glucose check upon arriving of 511. ? ?Order obtained for 10 units of novolog - entered under treatment plan - supportive care. ?

## 2022-01-10 NOTE — ED Provider Notes (Signed)
?[facility] DEPT ?Provider Note ? ? ?CSN: [id number] ?Arrival date & time: 01/10/22  1712 ? ?  ? ?History ? ?Chief Complaint  ?Patient presents with  ? Hyperglycemia  ? ? ?[name] is a 81 y.o. female. ? ? ?Hyperglycemia ?Associated symptoms: polyuria   ?Patient presents for hyperglycemia.  She had recent blood transfusions yesterday and today for multifactorial anemia.  For her anemia, which is partly due to autoimmune hemolytic anemia, she is prescribed prednisone and gets weekly rituximab.  She was seen in oncology clinic yesterday.  Hyperglycemia was identified at that time and was attributed to her steroid use.  Today she was advised to come to the emergency department due to her hyperglycemia.  Patient reports no current symptoms.  She did have symptomatic anemia prior to her blood transfusion yesterday.  Those symptoms have resolved.  She does state that she has been urinating a lot.  She has been on her steroid treatment for the past 2 weeks. ?  ? ?Home Medications ?Prior to Admission medications   ?Medication Sig Start Date End Date Taking? Authorizing Provider  ?acetaminophen (TYLENOL) 325 MG tablet Take 650 mg by mouth every 6 (six) hours as needed for mild pain.     [provider]  ?alendronate (FOSAMAX) 70 MG tablet Take 70 mg by mouth once a week. saturdays 12/06/21   [provider]  ?allopurinol (ZYLOPRIM) 100 MG tablet Take 100 mg by mouth daily.    [provider]  ?ALPRAZolam Duanne Moron) 0.5 MG tablet Take 1 tablet by mouth at bedtime as needed. 07/27/21   [provider]  ?amLODipine (NORVASC) 10 MG tablet TAKE 1 TABLET EVERY DAY ?Patient taking differently: Take 10 mg by mouth daily. 05/13/21   [name], MD  ?atorvastatin (LIPITOR) 80 MG tablet Take 1 tablet (80 mg total) by mouth daily at 6 PM. 11/22/18   [name], MD  ?Blood Glucose Monitoring Suppl (TRUE METRIX METER) w/Device KIT  03/18/21   [provider]   ?Cholecalciferol (VITAMIN D3) 1000 UNITS CAPS Take 2,000 Units by mouth daily with lunch.     [provider]  ?Cholecalciferol (VITAMIN D3) 50 MCG (2000 UT) capsule 1 capsule 11/26/18   [provider]  ?cloNIDine (CATAPRES) 0.2 MG tablet TAKE 1 TABLET TWICE DAILY ?Patient taking differently: Take 0.2 mg by mouth 2 (two) times daily. 06/22/21   [name], MD  ?cloNIDine (CATAPRES) 0.2 MG tablet 1 tablet    [provider]  ?Cyanocobalamin (VITAMIN B12) 1000 MCG TBCR Take 1,000 mcg by mouth daily with lunch.    [provider]  ?estradiol (ESTRACE) 0.1 MG/GM vaginal cream Place 0.5g nightly for two weeks then twice a week after ?Patient taking differently: Place 1 Applicatorful vaginally 3 (three) times a week. Place 0.5g nightly for two weeks then twice a week after 09/19/21   Jaquita Folds, MD  ?folic acid (FOLVITE) 1 MG tablet Take 1 tablet (1 mg total) by mouth daily. 12/17/21 03/17/22  Terrilee Croak, MD  ?furosemide (LASIX) 40 MG tablet TAKE 2 TABLETS (80 MG TOTAL) IN MORNING AND 1 AND 1/2 TABLETS (60 MG TOTAL) IN EVENING. CHANGE IN DOSAGE. ?Patient taking differently: Take 80 mg by mouth daily. 08/11/21   Clegg, Amy D, NP  ?glipiZIDE (GLUCOTROL) 5 MG tablet Take 1 tablet (5 mg total) by mouth daily. 01/09/22   Benay Pike, MD  ?glucose blood (TRUE METRIX BLOOD GLUCOSE TEST) test strip Use 1-2 times daily  [provider]  ?hydrALAZINE (APRESOLINE) 100 MG tablet TAKE 1 TABLET THREE TIMES DAILY ?Patient taking differently: Take 100 mg by mouth 3 (three) times daily. 12/16/20   [name], MD  ?lactose free nutrition (BOOST) LIQD Take 237 mLs by mouth daily. (To supplement meals)     [provider]  ?magnesium oxide (MAG-OX) 400 (241.3 Mg) MG tablet Take 1 tablet (400 mg total) by mouth daily. 01/30/18   [name], MD  ?metoprolol succinate (TOPROL-XL) 25 MG 24 hr tablet TAKE 1/2 TABLET EVERY DAY ?Patient taking differently: Take  12.5 mg by mouth daily. 07/19/21   [name], MD  ?pantoprazole (PROTONIX) 40 MG tablet Take 40 mg by mouth 2 (two) times daily.  12/05/14   [provider]  ?spironolactone (ALDACTONE) 25 MG tablet TAKE 1 TABLET EVERY DAY ?Patient taking differently: Take 25 mg by mouth daily. 05/13/21   [name], MD  ?tadalafil, PAH, (ADCIRCA) 20 MG tablet Take 2 tablets (40 mg total) by mouth daily. 12/06/21   [name], MD  ?TRUE METRIX BLOOD GLUCOSE TEST test strip SMARTSIG:Via Meter 04/08/21   [provider]  ?valACYclovir (VALTREX) 500 MG tablet Take 1 tablet (500 mg total) by mouth 2 (two) times daily. 01/02/22   Benay Pike, MD  ?vitamin C (ASCORBIC ACID) 500 MG tablet Take 500 mg by mouth daily with lunch.    [provider]  ?vitamin C (ASCORBIC ACID) 500 MG tablet 1 tablet    [provider]  ?warfarin (COUMADIN) 5 MG tablet Take 1 to 1 &1/2 tablets by mouth daily as directed by the coumadin clinic ?Patient taking differently: Take 5-7.5 mg by mouth See admin instructions. Take 1 to 1 &1/2 tablets by mouth daily as directed by the coumadin clinic. Take 48m Mon. Wed and Fri. 7.563mon all other days. 12/06/21   BeLorretta HarpMD  ?   ? ?Allergies    ?Flagyl [metronidazole], Ciprofloxacin, Coreg [carvedilol], Losartan, Sulfamethoxazole-trimethoprim, Verapamil, Zetia [ezetimibe], Zocor [simvastatin - high dose], and Gatifloxacin   ? ?Review of Systems   ?Review of Systems  ?Endocrine: Positive for polyuria.  ?All other systems reviewed and are negative. ? ?Physical Exam ?Updated Vital Signs ?BP (!) 131/44   Pulse 67   Temp 98.2 ?F (36.8 ?C) (Oral)   Resp 16   Ht _0  (1.499 m)   Wt 59.4 kg   SpO2 96%   BMI 26.46 kg/m?  ?Physical Exam ?Vitals and nursing note reviewed.  ?Constitutional:   ?   General: She is not in acute distress. ?   Appearance: Normal appearance. She is well-developed. She is not ill-appearing, toxic-appearing or diaphoretic.  ?HENT:  ?    Head: Normocephalic and atraumatic.  ?   Right Ear: External ear normal.  ?   Left Ear: External ear normal.  ?   Nose: Nose normal.  ?   Mouth/Throat:  ?   Mouth: Mucous membranes are moist.  ?   Pharynx: Oropharynx is clear.  ?Eyes:  ?   Extraocular Movements: Extraocular movements intact.  ?   Conjunctiva/sclera: Conjunctivae normal.  ?Cardiovascular:  ?   Rate and Rhythm: Normal rate and regular rhythm.  ?   Heart sounds: No murmur heard. ?Pulmonary:  ?   Effort: Pulmonary effort is normal. No respiratory distress.  ?   Breath sounds: Normal breath sounds. No wheezing or rales.  ?Abdominal:  ?   Palpations: Abdomen is soft.  ?  Tenderness: There is no abdominal tenderness.  ?Musculoskeletal:     ?   General: No swelling. Normal range of motion.  ?   Cervical back: Normal range of motion and neck supple. No rigidity.  ?   Right lower leg: Edema present.  ?   Left lower leg: Edema present.  ?Skin: ?   General: Skin is warm and dry.  ?   Capillary Refill: Capillary refill takes less than 2 seconds.  ?   Coloration: Skin is not jaundiced or pale.  ?Neurological:  ?   General: No focal deficit present.  ?   Mental Status: She is alert and oriented to person, place, and time.  ?   Cranial Nerves: No cranial nerve deficit.  ?   Sensory: No sensory deficit.  ?   Motor: No weakness.  ?   Coordination: Coordination normal.  ?Psychiatric:     ?   Mood and Affect: Mood normal.     ?   Behavior: Behavior normal.     ?   Thought Content: Thought content normal.     ?   Judgment: Judgment normal.  ? ? ?ED Results / Procedures / Treatments   ?Labs ?(all labs ordered are listed, but only abnormal results are displayed) ?Labs Reviewed  ?CBC WITH DIFFERENTIAL/PLATELET - Abnormal; Notable for the following components:  ?    Result Value  ? WBC 10.9 (*)   ? RBC 2.84 (*)   ? Hemoglobin 8.7 (*)   ? HCT 26.1 (*)   ? RDW 19.6 (*)   ? nRBC 0.6 (*)   ? Neutro Abs 10.2 (*)   ? Lymphs Abs 0.3 (*)   ? Abs Immature Granulocytes 0.18 (*)   ?  All other components within normal limits  ?COMPREHENSIVE METABOLIC PANEL - Abnormal; Notable for the following components:  ? Sodium 129 (*)   ? CO2 18 (*)   ? Glucose, Bld 488 (*)   ? BUN 99 (*)   ? Creatin

## 2022-01-10 NOTE — ED Provider Triage Note (Addendum)
Emergency Medicine Provider Triage Evaluation Note ? ?[name] , a 81 y.o. female  was evaluated in triage.  Pt complains of elevated CBG. Had transfusion yesterday and today for anemia with hgb 5.8. feeling tired and sleepy currently.  ?Taking '40mg'$  prednisone daily to "build the blood up." ?PCP called in glipizide yesterday however has not started this.  ?History of diabetes although has been fine for the past year without any meds.  ?Given insulin yesterday and today at the cancer center.  ?Sent here by the cancer center.  ?Review of Systems  ?Positive: fatigue ?Negative: Vomiting, diarrhea  ? ?Physical Exam  ?BP (!) 158/67 (BP Location: Left Arm)   Pulse 76   Temp 98.2 ?F (36.8 ?C) (Oral)   Resp 18   Ht '4\' 11"'$  (1.499 m)   Wt 59.4 kg   SpO2 99%   BMI 26.46 kg/m?  ?Gen:   Awake, no distress   ?Resp:  Normal effort  ?MSK:   Moves extremities without difficulty  ?Other:   ? ?Medical Decision Making  ?Medically screening exam initiated at 5:30 PM.  Appropriate orders placed.  [name] was informed that the remainder of the evaluation will be completed by another provider, this initial triage assessment does not replace that evaluation, and the importance of remaining in the ED until their evaluation is complete. ? ? ?  ?Tacy Learn, PA-C ?01/10/22 1728 ? ?  ?Tacy Learn, PA-C ?01/10/22 1730 ? ?

## 2022-01-10 NOTE — Progress Notes (Signed)
RN transferred pt to ED post blood infusion (per orders from Iruku MD). Report given to [facility] in triage.  ?

## 2022-01-10 NOTE — Telephone Encounter (Signed)
Due to pt's continued elevation in blood sugar readings despite use of glipizide at home and administration of 10 units novolog upon arrival to this office ( blood sugar was 511 upon arrival ) with noted increase to 621 with Cmet result recommendation per MD is pt needs to proceed to the ER for further evaluation and care. ? ?Of note pt has anemia ((hemolysis)and has been on steroid taper post discharge- due to severe heme drop steroid dose increased as of 01/09/2022. ? ?MD in this office has been in communication with [name] per above. ? ?Per call to ER Charge Nurse- Abby- report given with instructions to bring pt to the nurses station and they will assign her a room. ? ? ?

## 2022-01-10 NOTE — Progress Notes (Signed)
fingerst ?

## 2022-01-10 NOTE — ED Notes (Signed)
Pt has no complaints.  Reports she received a unit of blood at the [facility] yesterday and today.  She was supposed to start DM medication today "with her biggest meal, but has been at the [facility] all day" and hasn't taken it.  ?

## 2022-01-10 NOTE — Patient Instructions (Signed)
Blood Transfusion, Adult, Care After ?This sheet gives you information about how to care for yourself after your procedure. Your health care provider may also give you more specific instructions. If you have problems or questions, contact your health care provider. ?What can I expect after the procedure? ?After the procedure, it is common to have: ?Bruising and soreness where the IV was inserted. ?A headache. ?Follow these instructions at home: ?IV insertion site care ?  ?Follow instructions from your health care provider about how to take care of your IV insertion site. Make sure you: ?Wash your hands with soap and water before and after you change your bandage (dressing). If soap and water are not available, use hand sanitizer. ?Change your dressing as told by your health care provider. ?Check your IV insertion site every day for signs of infection. Check for: ?Redness, swelling, or pain. ?Bleeding from the site. ?Warmth. ?Pus or a bad smell. ?General instructions ?Take over-the-counter and prescription medicines only as told by your health care provider. ?Rest as told by your health care provider. ?Return to your normal activities as told by your health care provider. ?Keep all follow-up visits as told by your health care provider. This is important. ?Contact a health care provider if: ?You have itching or red, swollen areas of skin (hives). ?You feel anxious. ?You feel weak after doing your normal activities. ?You have redness, swelling, warmth, or pain around the IV insertion site. ?You have blood coming from the IV insertion site that does not stop with pressure. ?You have pus or a bad smell coming from your IV insertion site. ?Get help right away if: ?You have symptoms of a serious allergic or immune system reaction, including: ?Trouble breathing or shortness of breath. ?Swelling of the face or feeling flushed. ?Fever or chills. ?Pain in the head, back, or chest. ?Dark urine or blood in the urine. ?Widespread  rash. ?Fast heartbeat. ?Feeling dizzy or light-headed. ?If you receive your blood transfusion in an outpatient setting, you will be told whom to contact to report any reactions. ?These symptoms may represent a serious problem that is an emergency. Do not wait to see if the symptoms will go away. Get medical help right away. Call your local emergency services (911 in the [place] Do not drive yourself to the hospital. ?Summary ?Bruising and tenderness around the IV insertion site are common. ?Check your IV insertion site every day for signs of infection. ?Rest as told by your health care provider. Return to your normal activities as told by your health care provider. ?Get help right away for symptoms of a serious allergic or immune system reaction to blood transfusion. ?This information is not intended to replace advice given to you by your health care provider. Make sure you discuss any questions you have with your health care provider. ?[facility]  Discharge Instructions: ?Thank you for choosing [facility] to provide your oncology and hematology care.  ? ?If you have a lab appointment with the [facility], please go directly to the [facility] and check in at the registration area. ?  ?Wear comfortable clothing and clothing appropriate for easy access to any Portacath or PICC line.  ? ?We strive to give you quality time with your provider. You may need to reschedule your appointment if you arrive late (15 or more minutes).  Arriving late affects you and other patients whose appointments are after yours.  Also, if you miss three or more appointments without  notifying the office, you may be dismissed from the clinic at the provider?s discretion.    ?  ?For prescription refill requests, have your pharmacy contact our office and allow 72 hours for refills to be completed.   ? ?Today you received the following chemotherapy and/or immunotherapy agents: Rituximab.    ?  ?To  help prevent nausea and vomiting after your treatment, we encourage you to take your nausea medication as directed. ? ?BELOW ARE SYMPTOMS THAT SHOULD BE REPORTED IMMEDIATELY: ?*FEVER GREATER THAN 100.4 F (38 ?C) OR HIGHER ?*CHILLS OR SWEATING ?*NAUSEA AND VOMITING THAT IS NOT CONTROLLED WITH YOUR NAUSEA MEDICATION ?*UNUSUAL SHORTNESS OF BREATH ?*UNUSUAL BRUISING OR BLEEDING ?*URINARY PROBLEMS (pain or burning when urinating, or frequent urination) ?*BOWEL PROBLEMS (unusual diarrhea, constipation, pain near the anus) ?TENDERNESS IN MOUTH AND THROAT WITH OR WITHOUT PRESENCE OF ULCERS (sore throat, sores in mouth, or a toothache) ?UNUSUAL RASH, SWELLING OR PAIN  ?UNUSUAL VAGINAL DISCHARGE OR ITCHING  ? ?Items with * indicate a potential emergency and should be followed up as soon as possible or go to the Emergency Department if any problems should occur. ? ?Please show the CHEMOTHERAPY ALERT CARD or IMMUNOTHERAPY ALERT CARD at check-in to the Emergency Department and triage nurse. ? ?Should you have questions after your visit or need to cancel or reschedule your appointment, please contact [facility]  Dept: [phone]  and follow the prompts.  Office hours are 8:00 a.m. to 4:30 p.m. Monday - Friday. Please note that voicemails left after 4:00 p.m. may not be returned until the following business day.  We are closed weekends and major holidays. You have access to a nurse at all times for urgent questions. Please call the main number to the clinic Dept: [phone] and follow the prompts. ? ? ?For any non-urgent questions, you may also contact your provider using MyChart. We now offer e-Visits for anyone 62 and older to request care online for non-urgent symptoms. For details visit [email or web address]. ?  ?Also download the MyChart app! Go to the app store, search "MyChart", open the app, select [facility], and log in with your MyChart username and password. ? ?Due to Covid, a mask  is required upon entering the hospital/clinic. If you do not have a mask, one will be given to you upon arrival. For doctor visits, patients may have 1 support person aged 15 or older with them. For treatment visits, patients cannot have anyone with them due to current Covid guidelines and our immunocompromised population.  ? ? ?Document Revised: 01/20/2021 Document Reviewed: 03/20/2019 ?Elsevier Patient Education ? [place]. ? ?

## 2022-01-10 NOTE — Progress Notes (Signed)
Pt CBG 511. Iruku MD notified. Value charted. ?

## 2022-01-10 NOTE — ED Triage Notes (Addendum)
Per Leonides Sake from cancer center, states she has a history of anemia-has been taking prednisone 20 mg daily and took 40 mg of prednisone today-states she receive 1 unit of blood yesterday and 1 unit of blood today-was given 10 unit of insulin and 1532 for a CBG of 511-patient is asymptomatic, voicing no complaints-per patient states she has not taking anything for her DM in over a year-states the cancer center prescribed glipizide yesterday ?

## 2022-01-10 NOTE — Progress Notes (Signed)
Critcal Value Glucose 621.  Message  given to Marlon Pel, RN with Dr Chryl Heck at [phone].  Pt will be transferred to ED after blood transfusion.   ?

## 2022-01-11 ENCOUNTER — Other Ambulatory Visit: Payer: Self-pay

## 2022-01-11 DIAGNOSIS — E119 Type 2 diabetes mellitus without complications: Secondary | ICD-10-CM

## 2022-01-11 DIAGNOSIS — D594 Other nonautoimmune hemolytic anemias: Secondary | ICD-10-CM

## 2022-01-11 DIAGNOSIS — D649 Anemia, unspecified: Secondary | ICD-10-CM

## 2022-01-11 DIAGNOSIS — D500 Iron deficiency anemia secondary to blood loss (chronic): Secondary | ICD-10-CM

## 2022-01-11 DIAGNOSIS — D591 Autoimmune hemolytic anemia, unspecified: Secondary | ICD-10-CM

## 2022-01-11 DIAGNOSIS — E1165 Type 2 diabetes mellitus with hyperglycemia: Secondary | ICD-10-CM | POA: Diagnosis not present

## 2022-01-11 LAB — TYPE AND SCREEN
ABO/RH(D): A POS
Antibody Screen: NEGATIVE
Unit division: 0
Unit division: 0

## 2022-01-11 LAB — BPAM RBC
Blood Product Expiration Date: 202304212359
Blood Product Expiration Date: 202304222359
ISSUE DATE / TIME: 202304031244
ISSUE DATE / TIME: 202304041455
Unit Type and Rh: 6200
Unit Type and Rh: 6200

## 2022-01-11 LAB — GLUCOSE, CAPILLARY
Glucose-Capillary: 511 mg/dL (ref 70–99)
Glucose-Capillary: 567 mg/dL (ref 70–99)

## 2022-01-11 LAB — CBG MONITORING, ED
Glucose-Capillary: 219 mg/dL — ABNORMAL HIGH (ref 70–99)
Glucose-Capillary: 163 mg/dL — ABNORMAL HIGH (ref 70–99)
Glucose-Capillary: 158 mg/dL — ABNORMAL HIGH (ref 70–99)

## 2022-01-11 LAB — PROTIME-INR
INR: 2 — ABNORMAL HIGH (ref 0.8–1.2)
Prothrombin Time: 22.1 seconds — ABNORMAL HIGH (ref 11.4–15.2)

## 2022-01-12 ENCOUNTER — Other Ambulatory Visit: Payer: Self-pay | Admitting: *Deleted

## 2022-01-12 ENCOUNTER — Inpatient Hospital Stay: Payer: Medicare HMO

## 2022-01-12 ENCOUNTER — Ambulatory Visit (INDEPENDENT_AMBULATORY_CARE_PROVIDER_SITE_OTHER): Payer: Medicare HMO

## 2022-01-12 ENCOUNTER — Other Ambulatory Visit: Payer: Self-pay

## 2022-01-12 DIAGNOSIS — Z7901 Long term (current) use of anticoagulants: Secondary | ICD-10-CM

## 2022-01-12 DIAGNOSIS — D500 Iron deficiency anemia secondary to blood loss (chronic): Secondary | ICD-10-CM

## 2022-01-12 DIAGNOSIS — R739 Hyperglycemia, unspecified: Secondary | ICD-10-CM | POA: Diagnosis not present

## 2022-01-12 DIAGNOSIS — D591 Autoimmune hemolytic anemia, unspecified: Secondary | ICD-10-CM | POA: Diagnosis not present

## 2022-01-12 DIAGNOSIS — Z952 Presence of prosthetic heart valve: Secondary | ICD-10-CM | POA: Diagnosis not present

## 2022-01-12 DIAGNOSIS — N184 Chronic kidney disease, stage 4 (severe): Secondary | ICD-10-CM | POA: Diagnosis not present

## 2022-01-12 DIAGNOSIS — D508 Other iron deficiency anemias: Secondary | ICD-10-CM

## 2022-01-12 DIAGNOSIS — E1122 Type 2 diabetes mellitus with diabetic chronic kidney disease: Secondary | ICD-10-CM | POA: Diagnosis not present

## 2022-01-12 DIAGNOSIS — M8580 Other specified disorders of bone density and structure, unspecified site: Secondary | ICD-10-CM | POA: Diagnosis not present

## 2022-01-12 DIAGNOSIS — I7782 Antineutrophilic cytoplasmic antibody (ANCA) vasculitis: Secondary | ICD-10-CM | POA: Diagnosis not present

## 2022-01-12 DIAGNOSIS — D631 Anemia in chronic kidney disease: Secondary | ICD-10-CM | POA: Diagnosis not present

## 2022-01-12 DIAGNOSIS — Z5112 Encounter for antineoplastic immunotherapy: Secondary | ICD-10-CM | POA: Diagnosis not present

## 2022-01-12 DIAGNOSIS — D594 Other nonautoimmune hemolytic anemias: Secondary | ICD-10-CM

## 2022-01-12 LAB — CBC WITH DIFFERENTIAL (CANCER CENTER ONLY)
Abs Immature Granulocytes: 0.08 10*3/uL — ABNORMAL HIGH (ref 0.00–0.07)
Basophils Absolute: 0 10*3/uL (ref 0.0–0.1)
Basophils Relative: 0 %
Eosinophils Absolute: 0 10*3/uL (ref 0.0–0.5)
Eosinophils Relative: 0 %
HCT: 23.7 % — ABNORMAL LOW (ref 36.0–46.0)
Hemoglobin: 7.8 g/dL — ABNORMAL LOW (ref 12.0–15.0)
Immature Granulocytes: 1 %
Lymphocytes Relative: 2 %
Lymphs Abs: 0.2 10*3/uL — ABNORMAL LOW (ref 0.7–4.0)
MCH: 30.7 pg (ref 26.0–34.0)
MCHC: 32.9 g/dL (ref 30.0–36.0)
MCV: 93.3 fL (ref 80.0–100.0)
Monocytes Absolute: 0.1 10*3/uL (ref 0.1–1.0)
Monocytes Relative: 1 %
Neutro Abs: 7.6 10*3/uL (ref 1.7–7.7)
Neutrophils Relative %: 96 %
Platelet Count: 123 10*3/uL — ABNORMAL LOW (ref 150–400)
RBC: 2.54 MIL/uL — ABNORMAL LOW (ref 3.87–5.11)
RDW: 20.8 % — ABNORMAL HIGH (ref 11.5–15.5)
WBC Count: 8 10*3/uL (ref 4.0–10.5)
nRBC: 0.8 % — ABNORMAL HIGH (ref 0.0–0.2)

## 2022-01-12 LAB — CMP (CANCER CENTER ONLY)
ALT: 17 U/L (ref 0–44)
AST: 29 U/L (ref 15–41)
Albumin: 3.1 g/dL — ABNORMAL LOW (ref 3.5–5.0)
Alkaline Phosphatase: 57 U/L (ref 38–126)
Anion gap: 9 (ref 5–15)
BUN: 88 mg/dL — ABNORMAL HIGH (ref 8–23)
CO2: 20 mmol/L — ABNORMAL LOW (ref 22–32)
Calcium: 7.7 mg/dL — ABNORMAL LOW (ref 8.9–10.3)
Chloride: 103 mmol/L (ref 98–111)
Creatinine: 2.35 mg/dL — ABNORMAL HIGH (ref 0.44–1.00)
GFR, Estimated: 20 mL/min — ABNORMAL LOW (ref >60)
Glucose, Bld: 427 mg/dL — ABNORMAL HIGH (ref 70–99)
Potassium: 4.4 mmol/L (ref 3.5–5.1)
Sodium: 132 mmol/L — ABNORMAL LOW (ref 135–145)
Total Bilirubin: 0.5 mg/dL (ref 0.3–1.2)
Total Protein: 5 g/dL — ABNORMAL LOW (ref 6.5–8.1)

## 2022-01-12 LAB — PREPARE RBC (CROSSMATCH)

## 2022-01-12 LAB — SAMPLE TO BLOOD BANK

## 2022-01-12 LAB — RETICULOCYTES
Immature Retic Fract: 28.3 % — ABNORMAL HIGH (ref 2.3–15.9)
RBC.: 2.48 MIL/uL — ABNORMAL LOW (ref 3.87–5.11)
Retic Count, Absolute: 187 10*3/uL — ABNORMAL HIGH (ref 19.0–186.0)
Retic Ct Pct: 7.5 % — ABNORMAL HIGH (ref 0.4–3.1)

## 2022-01-12 LAB — POCT INR: INR: 1.5 — AB (ref 2.0–3.0)

## 2022-01-12 MED ORDER — SODIUM CHLORIDE 0.9% IV SOLUTION
250.0000 mL | Freq: Once | INTRAVENOUS | Status: AC
  Administered 2022-01-12: 250 mL via INTRAVENOUS

## 2022-01-12 MED ORDER — DIPHENHYDRAMINE HCL 25 MG PO CAPS
25.0000 mg | ORAL_CAPSULE | Freq: Once | ORAL | Status: AC
  Administered 2022-01-12: 25 mg via ORAL
  Filled 2022-01-12: qty 1

## 2022-01-12 MED ORDER — ACETAMINOPHEN 325 MG PO TABS
650.0000 mg | ORAL_TABLET | Freq: Once | ORAL | Status: AC
  Administered 2022-01-12: 650 mg via ORAL
  Filled 2022-01-12: qty 2

## 2022-01-12 NOTE — Patient Instructions (Signed)
Description   ?Take 2 tablets today and then resume taking Warfarin 1.5 tablets (7.'5mg'$ ) daily except 1 tablet on Mondays, Wednesdays, and Fridays.  ?Please keep green leafy veggies and boost in your diet.  ?Recheck INR in 1 week.  ? Call with any new meds [phone] & if you have any procedures where you need to hold warfarin, have them fax a clearance form to [phone]. ? ? ?  ?   ?

## 2022-01-12 NOTE — Patient Instructions (Signed)
Blood Transfusion, Adult °A blood transfusion is a procedure in which you receive blood through an IV tube. You may need this procedure because of: °A bleeding disorder. °An illness. °An injury. °A surgery. °The blood may come from someone else (a donor). You may also be able to donate blood for yourself. The blood given in a transfusion is made up of different types of cells. You may get: °Red blood cells. These carry oxygen to the cells in the body. °White blood cells. These help you fight infections. °Platelets. These help your blood to clot. °Plasma. This is the liquid part of your blood. It carries proteins and other substances through the body. °If you have a clotting disorder, you may also get other types of blood products. °Tell your doctor about: °Any blood disorders you have. °Any reactions you have had during a blood transfusion in the past. °Any allergies you have. °All medicines you are taking, including vitamins, herbs, eye drops, creams, and over-the-counter medicines. °Any surgeries you have had. °Any medical conditions you have. This includes any recent fever or cold symptoms. °Whether you are pregnant or may be pregnant. °What are the risks? °Generally, this is a safe procedure. However, problems may occur. °The most common problems include: °A mild allergic reaction. This includes red, swollen areas of skin (hives) and itching. °Fever or chills. This may be the body's response to new blood cells received. This may happen during or up to 4 hours after the transfusion. °More serious problems may include: °Too much fluid in the lungs. This may cause breathing problems. °A serious allergic reaction. This includes breathing trouble or swelling around the face and lips. °Lung injury. This causes breathing trouble and low oxygen in the blood. This can happen within hours of the transfusion or days later. °Too much iron. This can happen after getting many blood transfusions over a period of time. °An  infection or virus passed through the blood. This is rare. Donated blood is carefully tested before it is given. °Your body's defense system (immune system) trying to attack the new blood cells. This is rare. Symptoms may include fever, chills, nausea, low blood pressure, and low back or chest pain. °Donated cells attacking healthy tissues. This is rare. °What happens before the procedure? °Medicines °Ask your doctor about: °Changing or stopping your normal medicines. This is important. °Taking aspirin and ibuprofen. Do not take these medicines unless your doctor tells you to take them. °Taking over-the-counter medicines, vitamins, herbs, and supplements. °General instructions °Follow instructions from your doctor about what you cannot eat or drink. °You will have a blood test to find out your blood type. The test also finds out what type of blood your body will accept and matches it to the donor type. °If you are going to have a planned surgery, you may be able to donate your own blood. This may be done in case you need a transfusion. °You will have your temperature, blood pressure, and pulse checked. °You may receive medicine to help prevent an allergic reaction. This may be done if you have had a reaction to a transfusion before. This medicine may be given to you by mouth or through an IV tube. °This procedure lasts about 1-4 hours. Plan for the time you need. °What happens during the procedure? ° °An IV tube will be put into one of your veins. °The bag of donated blood will be attached to your IV tube. Then, the blood will enter through your vein. °Your temperature,   blood pressure, and pulse will be checked often. This is done to find early signs of a transfusion reaction. °Tell your nurse right away if you have any of these symptoms: °Shortness of breath or trouble breathing. °Chest or back pain. °Fever or chills. °Red, swollen areas of skin or itching. °If you have any signs or symptoms of a reaction, your  transfusion will be stopped. You may also be given medicine. °When the transfusion is finished, your IV tube will be taken out. °Pressure may be put on the IV site for a few minutes. °A bandage (dressing) will be put on the IV site. °The procedure may vary among doctors and hospitals. °What happens after the procedure? °You will be monitored until you leave the hospital or clinic. This includes checking your temperature, blood pressure, pulse, breathing rate, and blood oxygen level. °Your blood may be tested to see how you are responding to the transfusion. °You may be warmed with fluids or blankets. This is done to keep the temperature of your body normal. °If you have your procedure in an outpatient setting, you will be told whom to contact to report any reactions. °Where to find more information °To learn more, visit the [employer]: redcross.org °Summary °A blood transfusion is a procedure in which you are given blood through an IV tube. °The blood may come from someone else (a donor). You may also be able to donate blood for yourself. °The blood you are given is made up of different blood cells. You may receive red blood cells, platelets, plasma, or white blood cells. °Your temperature, blood pressure, and pulse will be checked often. °After the procedure, your blood may be tested to see how you are responding. °This information is not intended to replace advice given to you by your health care provider. Make sure you discuss any questions you have with your health care provider. °Document Revised: 03/20/2019 Document Reviewed: 03/20/2019 °Elsevier Patient Education © 2022 [employer]. ° °

## 2022-01-13 ENCOUNTER — Other Ambulatory Visit: Payer: Self-pay

## 2022-01-13 LAB — TYPE AND SCREEN
ABO/RH(D): A POS
Antibody Screen: NEGATIVE
Unit division: 0

## 2022-01-13 LAB — BPAM RBC
Blood Product Expiration Date: 202304252359
ISSUE DATE / TIME: 202304061507
Unit Type and Rh: 6200

## 2022-01-13 NOTE — Patient Instructions (Signed)
Patient Goals/Self-Care Activities: Heart Failure and Diabetes ?Take all medications as prescribed ?Attend all scheduled provider appointments ?call office if I gain more than 2 pounds in one day or 5 pounds in one week ?track weight in diary ?use salt in moderation ?watch for swelling in feet, ankles and legs every day ?weigh myself daily ?follow rescue plan if symptoms flare-up ?check blood sugar at prescribed times: twice daily ?enter blood sugar readings and medication or insulin into daily log ?take the blood sugar log to all doctor visits ?manage portion size ?Limit food high in carbohydrates such as rice, bread, potatoes, pasta ? ?

## 2022-01-13 NOTE — Patient Outreach (Signed)
[facility]) Care Management ? ?01/13/2022 ? ?[name] ?Aug 31, 1941 ?[id number] ? ? ?Telephone call to patient for follow up. Patient reports having problems with her anemia again.  She did have a blood transfusion and is on prednisone for now.  She reports however the prednisone is running her sugar up and she is now on tresiba.  She reports her sugar this morning before eating was 253 and she is concerned. She states that her sugars have kind of been all over the place ranging from 116 to 400.  Patient states she will be calling physician to discuss her sugars and follow any recommendations given.   ? ?CM discussed with patient diabetes and heart failure management and importance of limiting carbohydrates while on prednisone.  She verbalized understanding.   ? ?Care Plan : RN CM Plan of Care  ?Updates made by Jon Billings, RN since 01/13/2022 12:00 AM  ?  ? ?Problem: Chronic disease management and care coordination needs of HF and DM   ?Priority: High  ?  ? ?Long-Range Goal: Development of plan of care for management of HF and DM   ?Start Date: 01/13/2022  ?This Visit's Progress: On track  ?Priority: High  ?Note:   ?Current Barriers:  ?Chronic Disease Management support and education needs related to CHF and DMII  ? ?RNCM Clinical Goal(s):  ?Patient will verbalize basic understanding of  CHF and DMII disease process and self health management plan as evidenced by No acute exacerbation of heart failure and blood sugars less than 300 ?continue to work with RN Care Manager to address care management and care coordination needs related to  CHF and DMII as evidenced by adherence to CM Team Scheduled appointments through collaboration with RN Care manager, provider, and care team.  ? ?Interventions: ?Education and support related to heart failure and diabetes ?Inter-disciplinary care team collaboration (see longitudinal plan of care) ?Evaluation of current treatment plan related to  self management and  patient's adherence to plan as established by provider ? ? ?Heart Failure Interventions:  (Status:  Goal on track:  Yes.) Long Term Goal ?Basic overview and discussion of pathophysiology of Heart Failure reviewed ?Provided education on low sodium diet ?Discussed importance of daily weight and advised patient to weigh and record daily ? ?Diabetes Interventions:  (Status:  New goal.) Long Term Goal ?Assessed patient's understanding of A1c goal: <7% ?Provided education to patient about basic DM disease process ?Reviewed medications with patient and discussed importance of medication adherence ?Discussed plans with patient for ongoing care management follow up and provided patient with direct contact information for care management team ?Discussed importance of monitoring carbohydrates and sugars.  ?Lab Results  ?Component Value Date  ? HGBA1C 4.6 (L) 02/24/2019  ?01/13/22 Patient having some problems with her anemia.  She is on prednisone and has had a blood transfusion.  Patient reports she is now on tresiba for her sugars 14 units daily.  Patient reports blood sugar however this morning was 253.  She is concerned with highs and will be calling physician to reports sugars and next steps.   Discussed importance of tight carbohydrate control to help with her sugars.  Weight up to 131 lbs. However, due to patient on prednisone explains weight gain.  CM reiterated signs of heart failure.   ? ? ?Patient Goals/Self-Care Activities: Heart Failure and Diabetes ?Take all medications as prescribed ?Attend all scheduled provider appointments ?call office if I gain more than 2 pounds in one day or 5  pounds in one week ?track weight in diary ?use salt in moderation ?watch for swelling in feet, ankles and legs every day ?weigh myself daily ?follow rescue plan if symptoms flare-up ?check blood sugar at prescribed times: twice daily ?enter blood sugar readings and medication or insulin into daily log ?take the blood sugar log to all  doctor visits ?manage portion size ?Limit food high in carbohydrates such as rice, bread, potatoes, pasta ? ?Follow Up Plan:  Telephone follow up appointment with care management team member scheduled for:  Later this month ?The patient has been provided with contact information for the care management team and has been advised to call with any health related questions or concerns.  ? ?  ? ?Plan : Follow-up: Patient agrees to Care Plan and Follow-up. ?Follow-up  later this month. ? ?[name], RN, MSN ?[facility] Care Management ?Care Management Coordinator ?Direct Line [phone] ?Toll Free: [phone]  ?Fax: [phone] ? ?

## 2022-01-16 ENCOUNTER — Other Ambulatory Visit: Payer: Self-pay | Admitting: *Deleted

## 2022-01-16 ENCOUNTER — Inpatient Hospital Stay: Payer: Medicare HMO | Admitting: Hematology and Oncology

## 2022-01-16 ENCOUNTER — Inpatient Hospital Stay: Payer: Medicare HMO

## 2022-01-16 ENCOUNTER — Other Ambulatory Visit: Payer: Self-pay

## 2022-01-16 VITALS — BP 144/71 | HR 64 | Temp 97.8°F | Resp 16

## 2022-01-16 DIAGNOSIS — I7782 Antineutrophilic cytoplasmic antibody (ANCA) vasculitis: Secondary | ICD-10-CM | POA: Diagnosis not present

## 2022-01-16 DIAGNOSIS — D591 Autoimmune hemolytic anemia, unspecified: Secondary | ICD-10-CM | POA: Diagnosis not present

## 2022-01-16 DIAGNOSIS — D594 Other nonautoimmune hemolytic anemias: Secondary | ICD-10-CM | POA: Diagnosis not present

## 2022-01-16 DIAGNOSIS — D649 Anemia, unspecified: Secondary | ICD-10-CM

## 2022-01-16 DIAGNOSIS — M8580 Other specified disorders of bone density and structure, unspecified site: Secondary | ICD-10-CM | POA: Diagnosis not present

## 2022-01-16 DIAGNOSIS — N184 Chronic kidney disease, stage 4 (severe): Secondary | ICD-10-CM | POA: Diagnosis not present

## 2022-01-16 DIAGNOSIS — Z5112 Encounter for antineoplastic immunotherapy: Secondary | ICD-10-CM | POA: Diagnosis not present

## 2022-01-16 DIAGNOSIS — E119 Type 2 diabetes mellitus without complications: Secondary | ICD-10-CM

## 2022-01-16 DIAGNOSIS — R739 Hyperglycemia, unspecified: Secondary | ICD-10-CM | POA: Diagnosis not present

## 2022-01-16 DIAGNOSIS — D61818 Other pancytopenia: Secondary | ICD-10-CM

## 2022-01-16 DIAGNOSIS — E1122 Type 2 diabetes mellitus with diabetic chronic kidney disease: Secondary | ICD-10-CM | POA: Diagnosis not present

## 2022-01-16 DIAGNOSIS — D631 Anemia in chronic kidney disease: Secondary | ICD-10-CM | POA: Diagnosis not present

## 2022-01-16 LAB — CMP (CANCER CENTER ONLY)
ALT: 17 U/L (ref 0–44)
AST: 19 U/L (ref 15–41)
Albumin: 3.3 g/dL — ABNORMAL LOW (ref 3.5–5.0)
Alkaline Phosphatase: 54 U/L (ref 38–126)
Anion gap: 8 (ref 5–15)
BUN: 100 mg/dL — ABNORMAL HIGH (ref 8–23)
CO2: 22 mmol/L (ref 22–32)
Calcium: 8 mg/dL — ABNORMAL LOW (ref 8.9–10.3)
Chloride: 101 mmol/L (ref 98–111)
Creatinine: 2.48 mg/dL — ABNORMAL HIGH (ref 0.44–1.00)
GFR, Estimated: 19 mL/min — ABNORMAL LOW (ref >60)
Glucose, Bld: 169 mg/dL — ABNORMAL HIGH (ref 70–99)
Potassium: 4.3 mmol/L (ref 3.5–5.1)
Sodium: 131 mmol/L — ABNORMAL LOW (ref 135–145)
Total Bilirubin: 0.6 mg/dL (ref 0.3–1.2)
Total Protein: 5.2 g/dL — ABNORMAL LOW (ref 6.5–8.1)

## 2022-01-16 LAB — CBC WITH DIFFERENTIAL (CANCER CENTER ONLY)
Abs Immature Granulocytes: 0.05 10*3/uL (ref 0.00–0.07)
Basophils Absolute: 0 10*3/uL (ref 0.0–0.1)
Basophils Relative: 0 %
Eosinophils Absolute: 0 10*3/uL (ref 0.0–0.5)
Eosinophils Relative: 0 %
HCT: 27.2 % — ABNORMAL LOW (ref 36.0–46.0)
Hemoglobin: 9 g/dL — ABNORMAL LOW (ref 12.0–15.0)
Immature Granulocytes: 1 %
Lymphocytes Relative: 2 %
Lymphs Abs: 0.2 10*3/uL — ABNORMAL LOW (ref 0.7–4.0)
MCH: 30.4 pg (ref 26.0–34.0)
MCHC: 33.1 g/dL (ref 30.0–36.0)
MCV: 91.9 fL (ref 80.0–100.0)
Monocytes Absolute: 0.2 10*3/uL (ref 0.1–1.0)
Monocytes Relative: 2 %
Neutro Abs: 9.1 10*3/uL — ABNORMAL HIGH (ref 1.7–7.7)
Neutrophils Relative %: 95 %
Platelet Count: 147 10*3/uL — ABNORMAL LOW (ref 150–400)
RBC: 2.96 MIL/uL — ABNORMAL LOW (ref 3.87–5.11)
RDW: 21.1 % — ABNORMAL HIGH (ref 11.5–15.5)
WBC Count: 9.6 10*3/uL (ref 4.0–10.5)
nRBC: 0 % (ref 0.0–0.2)

## 2022-01-16 LAB — SAMPLE TO BLOOD BANK

## 2022-01-16 LAB — RETIC PANEL
Immature Retic Fract: 20.7 % — ABNORMAL HIGH (ref 2.3–15.9)
RBC.: 3.05 MIL/uL — ABNORMAL LOW (ref 3.87–5.11)
Retic Count, Absolute: 291.5 10*3/uL — ABNORMAL HIGH (ref 19.0–186.0)
Retic Ct Pct: 9.6 % — ABNORMAL HIGH (ref 0.4–3.1)
Reticulocyte Hemoglobin: 31.8 pg (ref >27.9)

## 2022-01-16 MED ORDER — SODIUM CHLORIDE 0.9 % IV SOLN: Freq: Once | INTRAVENOUS | Status: AC

## 2022-01-16 MED ORDER — ACETAMINOPHEN 325 MG PO TABS
650.0000 mg | ORAL_TABLET | Freq: Once | ORAL | Status: AC
  Administered 2022-01-16: 650 mg via ORAL
  Filled 2022-01-16: qty 2

## 2022-01-16 MED ORDER — DIPHENHYDRAMINE HCL 25 MG PO CAPS
25.0000 mg | ORAL_CAPSULE | Freq: Once | ORAL | Status: AC
  Administered 2022-01-16: 25 mg via ORAL
  Filled 2022-01-16: qty 1

## 2022-01-16 MED ORDER — SODIUM CHLORIDE 0.9 % IV SOLN
375.0000 mg/m2 | Freq: Once | INTRAVENOUS | Status: AC
  Administered 2022-01-16: 600 mg via INTRAVENOUS
  Filled 2022-01-16: qty 50

## 2022-01-16 NOTE — Progress Notes (Signed)
Dr. Chryl Heck gave the ok to treat with Scr of 2.48- not far off general trend. ?

## 2022-01-16 NOTE — Progress Notes (Signed)
[facility] Follow up: ?  ? ?[name], [name], MD ?[address] ?[place] [address] ? ?DIAGNOSIS:  ? ?SUMMARY OF HEMATOLOGIC HISTORY: ?81 y.o. [place] woman with multifactorial anemia, as follows: ?            (a) anemia of chronic inflammation: As of May 2020 she has a SED rate of 95, CRP 1.1, positive ANA and RF; subsequently she was found to be ANCA positive ?            (b) hemolysis (mild): she has a mildly elevated LDH and a DAT positive for complement, not IgG; this is c/w (a) above ?            (c) anemia of renal insufficiency: inadequate EPO resonse to anemia    ?                     (d) GIB/ iron deficiency in patient on lifelong anticoagulaton ?  ?(1) Feraheme received 03/01/2019, repeated 03/20/2019  ?            (a) reticulocyte 03/07/2019 up from 43.4 a year ago to 191.8 after iron infusion ?            (b) feraheme repeated on 05/12/2019 ?            (c) subsequent ferritin levels >100 ?  ?(2) darbepoetin: starting 05/05/2019 at 142mg given every 2 weeks ?            (a) dose increased to 200 mcg every week starting 07/01/2019  ?            (b) back to every 2 weeks beginning 08/04/2019 still at 200 mcg dose ?            (c) switched to Retacrit every 4 weeks as of 08/18/2019 ?  ?(3) ANCA positive vasculitis: diagnosed 05/08/2019 (titer 1:640) ?            (a) prednisone 40 mg daily started 05/31/2019 ?            (b) rituximab weekly started 06/12/2019 (received test dose 06/05/2019) ?                        (i) hepatitis B sAg, core Ab and HIV negative 05/08/2019 ?                        (ii) rituximab held after 07/14/2019 dose (received 5 weekly doses) with neutropenia ?                        (iii) rituximab resumed 08/18/2019 but changed to monthly ?                        (iv) rituximab changed to every 2 months beginning with 02/02/2020 dose ?                        (v) rituximab changed to every 12 weeks after the 10/12/2020 dose ?            (c) prednisone  tapered to off as of 09/15/2019 ?  ?(4) osteopenia with a T score of -1.5 on bone density 02/28/2016 at the [facility] center ? ?CURRENT THERAPY: Rituximab ? ?INTERVAL HISTORY: ? ?DAdrianne ShackletonBean 81y.o. female returns for her follow-up of anemia. ? ?  Ms Rollene Rotunda was seen in the infusion.  ?She feels tired today, denies any bleeding.  She has been taking prednisone 40 and insulin as directed.  Her glucose this morning before she came to the infusion was 99.  She is tolerating rituximab very well otherwise.  She notices some easy bruising.  No other complaints ?Rest of  the pertinent 10 point ROS reviewed and negative. ? ? ?Patient Active Problem List  ? Diagnosis Date Noted  ? Hyperglycemia 01/09/2022  ? Autoimmune hemolytic anemia ([place]) 01/02/2022  ? Bilateral lower extremity edema 01/02/2022  ? Cold sore 01/02/2022  ? Symptomatic anemia 12/15/2021  ? Abnormal chest x-ray 12/15/2021  ? Supratherapeutic INR 12/15/2021  ? Elevated troponin 12/15/2021  ? Unilateral primary osteoarthritis, right hip 05/31/2021  ? Midline cystocele 05/30/2021  ? Primary localized osteoarthritis of pelvic region and thigh 05/30/2021  ? Vaginal pain 05/30/2021  ? Asymmetric SNHL (sensorineural hearing loss) 01/06/2021  ? Referred otalgia of both ears 01/06/2021  ? Bilateral hearing loss 08/31/2020  ? Bilateral impacted cerumen 08/31/2020  ? Low back pain 02/20/2020  ? CKD (chronic kidney disease), stage IV ([place]) 10/13/2019  ? Leukopenia due to antineoplastic chemotherapy ([place]) 08/18/2019  ? Chemotherapy induced neutropenia ([place]) 07/21/2019  ? Thrombophilia ([place]) 06/19/2019  ? Encounter for general adult medical examination without abnormal findings 06/13/2019  ? Arteritis ([place]) 06/06/2019  ? Hypertensive heart and chronic kidney disease with heart failure and stage 1 through stage 4 chronic kidney disease, or unspecified chronic kidney disease ([place]) 06/06/2019  ? Muscle weakness 06/06/2019  ? Pancytopenia ([place]) 05/28/2019  ? Dyspnea 05/28/2019  ?  Vasculitis, ANCA positive 05/28/2019  ? Disorder of connective tissue ([place]) 05/14/2019  ? Renal insufficiency 05/08/2019  ? Acute on chronic diastolic CHF (congestive heart failure) ([place]) 04/07/2019  ? Left leg cellulitis 04/07/2019  ? PAF (paroxysmal atrial fibrillation) ([place]) 04/07/2019  ? Iron deficiency anemia 02/28/2019  ? 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 ([place]) 02/28/2019  ? Diabetic retinopathy associated with type 2 diabetes mellitus ([place]) 09/02/2018  ? Chronic pain 06/19/2018  ? Non-thrombocytopenic purpura ([place]) 05/21/2018  ? Gout 03/22/2018  ? Malnutrition of moderate degree 03/12/2018  ? Acute on chronic diastolic heart failure ([place]) 03/06/2018  ? Diabetes mellitus type 2, controlled ([place]) 03/06/2018  ? S/P cholecystectomy 03/06/2018  ? HTN (hypertension) 03/06/2018  ? H/O mechanical aortic valve replacement 03/06/2018  ? Moderate to severe pulmonary hypertension ([place]) 03/06/2018  ? GERD (gastroesophageal reflux disease) 02/21/2018  ? Anxiety 02/21/2018  ? Chronic diastolic CHF (congestive heart failure) ([place]) 02/21/2018  ? Diarrhea 02/21/2018  ? General weakness 02/02/2018  ? Thrombocytopenia ([place]) 01/25/2018  ? Arm pain, diffuse 01/25/2018  ? Anemia 01/23/2018  ? Cholelithiasis 01/23/2018  ? Aortic atherosclerosis ([place]) 01/23/2018  ? GI bleed 01/18/2018  ? Age-related osteoporosis without current pathological fracture 05/24/2017  ? Carotid artery occlusion 05/24/2017  ? Generalized anxiety disorder 05/24/2017  ? Hearing loss 05/24/2017  ? Presence of prosthetic heart valve 05/24/2017  ? Coronary artery disease 08/18/2013  ? Carotid artery disease ([place]) 08/18/2013  ? Peripheral arterial disease ([place]) 08/18/2013  ? Diabetes mellitus without complication ([place]) 27/78/2423  ? Benign positional vertigo 10/06/2011  ? Hyponatremia 10/06/2011  ? Hypertension   ? Hypercholesteremia   ? Mechanical heart valve present   ? Chronic anticoagulation   ? ? ?is allergic to flagyl  [metronidazole], ciprofloxacin, coreg [carvedilol], losartan, sulfamethoxazole-trimethoprim, verapamil, zetia [ezetimibe], zocor [simvastatin - high dose], and gatifloxacin. ? ?MEDICAL HISTORY: ?Past Medical History:  ?  Diagnosis Date  ? Aortic atherosclerosis ([place]) 01/23/2018  ? Atrial fibrillation ([place])   ? Carotid artery disease ([place])   ? Cholelithiasis 01/23/2018  ? Chronic anticoagulation   ? Coronary artery disease   ? status post coronary artery bypass grafting times 07/10/2004  ? Diabetes mellitus   ? Hypercholesteremia   ? Hypertension   ? Mechanical heart valve present   ? H. aortic valve replacement at the time of bypass surgery October 2005  ? Moderate to severe pulmonary hypertension (HCC)   ? Peripheral arterial disease ([place])   ? history of left common iliac artery PTA and stenting for a chronic total occlusion 08/26/01  ? ? ?SURGICAL HISTORY: ?Past Surgical History:  ?Procedure Laterality Date  ? anterior repair  2009  ? AORTIC VALVE REPLACEMENT  2005  ? Dr. Cyndia Bent  ? CARDIAC CATHETERIZATION  11/10/2004  ? 40% right common illiac, 70% in stent restenosis of distal left common illiac,   ? CARDIAC CATHETERIZATION  05/18/2004  ? LAD 50-70% midstenosis, RCA dominant w/50% stenosis, 50% Right common Illiac artery ostial stenosis, 90% in stent restenosis within midportion of left common illiac stent  ? Carotid Duplex  03/12/2012  ? RSA-elev. velocities suggestive of a 50-69% diameter reduction, Right&Left Bulb/Prox ICA-mild-mod.fibrous plaqueelevating Velocities abnormal study.  ? CHOLECYSTECTOMY N/A 03/01/2018  ? Procedure: LAPAROSCOPIC CHOLECYSTECTOMY;  Surgeon: Judeth Horn, MD;  Location: [facility];  Service: General;  Laterality: N/A;  ? COLONOSCOPY WITH PROPOFOL N/A 01/22/2018  ? Procedure: COLONOSCOPY WITH PROPOFOL;  Surgeon: Wilford Corner, MD;  Location: [facility];  Service: Endoscopy;  Laterality: N/A;  ? ESOPHAGOGASTRODUODENOSCOPY (EGD) WITH PROPOFOL N/A 01/22/2018  ? Procedure:  ESOPHAGOGASTRODUODENOSCOPY (EGD) WITH PROPOFOL;  Surgeon: Wilford Corner, MD;  Location: [facility];  Service: Endoscopy;  Laterality: N/A;  ? ESOPHAGOGASTRODUODENOSCOPY (EGD) WITH PROPOFOL N/A 11/21/2018  ? Procedure: ESOPHAGOGAST

## 2022-01-16 NOTE — Patient Instructions (Signed)
[facility]  Discharge Instructions: ?Thank you for choosing [facility] to provide your oncology and hematology care.  ? ?If you have a lab appointment with the [facility], please go directly to the [facility] and check in at the registration area. ?  ?Wear comfortable clothing and clothing appropriate for easy access to any Portacath or PICC line.  ? ?We strive to give you quality time with your provider. You may need to reschedule your appointment if you arrive late (15 or more minutes).  Arriving late affects you and other patients whose appointments are after yours.  Also, if you miss three or more appointments without notifying the office, you may be dismissed from the clinic at the provider?s discretion.    ?  ?For prescription refill requests, have your pharmacy contact our office and allow 72 hours for refills to be completed.   ? ?Today you received the following chemotherapy and/or immunotherapy agents Ruxience    ?  ?To help prevent nausea and vomiting after your treatment, we encourage you to take your nausea medication as directed. ? ?BELOW ARE SYMPTOMS THAT SHOULD BE REPORTED IMMEDIATELY: ?*FEVER GREATER THAN 100.4 F (38 ?C) OR HIGHER ?*CHILLS OR SWEATING ?*NAUSEA AND VOMITING THAT IS NOT CONTROLLED WITH YOUR NAUSEA MEDICATION ?*UNUSUAL SHORTNESS OF BREATH ?*UNUSUAL BRUISING OR BLEEDING ?*URINARY PROBLEMS (pain or burning when urinating, or frequent urination) ?*BOWEL PROBLEMS (unusual diarrhea, constipation, pain near the anus) ?TENDERNESS IN MOUTH AND THROAT WITH OR WITHOUT PRESENCE OF ULCERS (sore throat, sores in mouth, or a toothache) ?UNUSUAL RASH, SWELLING OR PAIN  ?UNUSUAL VAGINAL DISCHARGE OR ITCHING  ? ?Items with * indicate a potential emergency and should be followed up as soon as possible or go to the Emergency Department if any problems should occur. ? ?Please show the CHEMOTHERAPY ALERT CARD or IMMUNOTHERAPY ALERT CARD at check-in to  the Emergency Department and triage nurse. ? ?Should you have questions after your visit or need to cancel or reschedule your appointment, please contact [facility]  Dept: [phone]  and follow the prompts.  Office hours are 8:00 a.m. to 4:30 p.m. Monday - Friday. Please note that voicemails left after 4:00 p.m. may not be returned until the following business day.  We are closed weekends and major holidays. You have access to a nurse at all times for urgent questions. Please call the main number to the clinic Dept: [phone] and follow the prompts. ? ? ?For any non-urgent questions, you may also contact your provider using MyChart. We now offer e-Visits for anyone 83 and older to request care online for non-urgent symptoms. For details visit [email or web address]. ?  ?Also download the MyChart app! Go to the app store, search "MyChart", open the app, select [facility], and log in with your MyChart username and password. ? ?Due to Covid, a mask is required upon entering the hospital/clinic. If you do not have a mask, one will be given to you upon arrival. For doctor visits, patients may have 1 support person aged 7 or older with them. For treatment visits, patients cannot have anyone with them due to current Covid guidelines and our immunocompromised population.  ?Rituximab Injection ?What is this medication? ?RITUXIMAB (ri TUX i mab) is a monoclonal antibody. It is used to treat certain types of cancer like non-Hodgkin lymphoma and chronic lymphocytic leukemia. It is also used to treat rheumatoid arthritis, granulomatosis with polyangiitis, microscopic polyangiitis, and pemphigus vulgaris. ?This medicine may  be used for other purposes; ask your health care provider or pharmacist if you have questions. ?COMMON BRAND NAME(S): RIABNI, Rituxan, RUXIENCE ?What should I tell my care team before I take this medication? ?They need to know if you have any of these conditions: ?chest  pain ?heart disease ?infection especially a viral infection such as chickenpox, cold sores, hepatitis B, or herpes ?immune system problems ?irregular heartbeat or rhythm ?kidney disease ?low blood counts (white cells, platelets, or red cells) ?lung disease ?recent or upcoming vaccine ?an unusual or allergic reaction to rituximab, other medicines, foods, dyes, or preservatives ?pregnant or trying to get pregnant ?breast-feeding ?How should I use this medication? ?This medicine is injected into a vein. It is given by a health care provider in a hospital or clinic setting. ?A special MedGuide will be given to you before each treatment. Be sure to read this information carefully each time. ?Talk to your health care provider about the use of this medicine in children. While this drug may be prescribed for children as young as 6 months for selected conditions, precautions do apply. ?Overdosage: If you think you have taken too much of this medicine contact a poison control center or emergency room at once. ?NOTE: This medicine is only for you. Do not share this medicine with others. ?What if I miss a dose? ?Keep appointments for follow-up doses. It is important not to miss your dose. Call your health care provider if you are unable to keep an appointment. ?What may interact with this medication? ?Do not take this medicine with any of the following medicines: ?live vaccines ?This medicine may also interact with the following medicines: ?cisplatin ?This list may not describe all possible interactions. Give your health care provider a list of all the medicines, herbs, non-prescription drugs, or dietary supplements you use. Also tell them if you smoke, drink alcohol, or use illegal drugs. Some items may interact with your medicine. ?What should I watch for while using this medication? ?Your condition will be monitored carefully while you are receiving this medicine. You may need blood work done while you are taking this  medicine. ?This medicine can cause serious infusion reactions. To reduce the risk your health care provider may give you other medicines to take before receiving this one. Be sure to follow the directions from your health care provider. ?This medicine may increase your risk of getting an infection. Call your health care provider for advice if you get a fever, chills, sore throat, or other symptoms of a cold or flu. Do not treat yourself. Try to avoid being around people who are sick. ?Call your health care provider if you are around anyone with measles, chickenpox, or if you develop sores or blisters that do not heal properly. ?Avoid taking medicines that contain aspirin, acetaminophen, ibuprofen, naproxen, or ketoprofen unless instructed by your health care provider. These medicines may hide a fever. ?This medicine may cause serious skin reactions. They can happen weeks to months after starting the medicine. Contact your health care provider right away if you notice fevers or flu-like symptoms with a rash. The rash may be red or purple and then turn into blisters or peeling of the skin. Or, you might notice a red rash with swelling of the face, lips or lymph nodes in your neck or under your arms. ?In some patients, this medicine may cause a serious brain infection that may cause death. If you have any problems seeing, thinking, speaking, walking, or standing, tell your healthcare  professional right away. If you cannot reach your healthcare professional, urgently seek other source of medical care. ?Do not become pregnant while taking this medicine or for at least 12 months after stopping it. Women should inform their health care provider if they wish to become pregnant or think they might be pregnant. There is potential for serious harm to an unborn child. Talk to your health care provider for more information. Women should use a reliable form of birth control while taking this medicine and for 12 months after  stopping it. Do not breast-feed while taking this medicine or for at least 6 months after stopping it. ?What side effects may I notice from receiving this medication? ?Side effects that you should report to your h

## 2022-01-18 ENCOUNTER — Ambulatory Visit (INDEPENDENT_AMBULATORY_CARE_PROVIDER_SITE_OTHER): Payer: Medicare HMO

## 2022-01-18 ENCOUNTER — Other Ambulatory Visit: Payer: Self-pay

## 2022-01-18 DIAGNOSIS — Z7901 Long term (current) use of anticoagulants: Secondary | ICD-10-CM | POA: Diagnosis not present

## 2022-01-18 DIAGNOSIS — D594 Other nonautoimmune hemolytic anemias: Secondary | ICD-10-CM

## 2022-01-18 DIAGNOSIS — Z952 Presence of prosthetic heart valve: Secondary | ICD-10-CM

## 2022-01-18 LAB — POCT INR: INR: 4.2 — AB (ref 2.0–3.0)

## 2022-01-18 NOTE — Patient Instructions (Signed)
Description   ?Skip today's dosage of Warfarin, then start taking 1 tablet daily except 1.5 tablets on Sundays, Tuesdays and Thursdays.  Recheck in 1 week.  Call with Prednisone dosage changes, currently on '40mg'$  daily. Please keep green leafy veggies and boost in your diet. Call with any new meds [phone] & if you have any procedures where you need to hold warfarin, have them fax a clearance form to [phone]. ? ? ?  ?  ?

## 2022-01-19 ENCOUNTER — Inpatient Hospital Stay: Payer: Medicare HMO

## 2022-01-19 ENCOUNTER — Other Ambulatory Visit: Payer: Self-pay

## 2022-01-19 DIAGNOSIS — D631 Anemia in chronic kidney disease: Secondary | ICD-10-CM | POA: Diagnosis not present

## 2022-01-19 DIAGNOSIS — E1122 Type 2 diabetes mellitus with diabetic chronic kidney disease: Secondary | ICD-10-CM | POA: Diagnosis not present

## 2022-01-19 DIAGNOSIS — R739 Hyperglycemia, unspecified: Secondary | ICD-10-CM | POA: Diagnosis not present

## 2022-01-19 DIAGNOSIS — N184 Chronic kidney disease, stage 4 (severe): Secondary | ICD-10-CM | POA: Diagnosis not present

## 2022-01-19 DIAGNOSIS — D594 Other nonautoimmune hemolytic anemias: Secondary | ICD-10-CM

## 2022-01-19 DIAGNOSIS — D591 Autoimmune hemolytic anemia, unspecified: Secondary | ICD-10-CM | POA: Diagnosis not present

## 2022-01-19 DIAGNOSIS — M8580 Other specified disorders of bone density and structure, unspecified site: Secondary | ICD-10-CM | POA: Diagnosis not present

## 2022-01-19 DIAGNOSIS — Z5112 Encounter for antineoplastic immunotherapy: Secondary | ICD-10-CM | POA: Diagnosis not present

## 2022-01-19 DIAGNOSIS — I7782 Antineutrophilic cytoplasmic antibody (ANCA) vasculitis: Secondary | ICD-10-CM | POA: Diagnosis not present

## 2022-01-19 LAB — CBC WITH DIFFERENTIAL (CANCER CENTER ONLY)
Abs Immature Granulocytes: 0.03 10*3/uL (ref 0.00–0.07)
Basophils Absolute: 0 10*3/uL (ref 0.0–0.1)
Basophils Relative: 0 %
Eosinophils Absolute: 0.1 10*3/uL (ref 0.0–0.5)
Eosinophils Relative: 1 %
HCT: 28.6 % — ABNORMAL LOW (ref 36.0–46.0)
Hemoglobin: 9.4 g/dL — ABNORMAL LOW (ref 12.0–15.0)
Immature Granulocytes: 0 %
Lymphocytes Relative: 2 %
Lymphs Abs: 0.2 10*3/uL — ABNORMAL LOW (ref 0.7–4.0)
MCH: 30.6 pg (ref 26.0–34.0)
MCHC: 32.9 g/dL (ref 30.0–36.0)
MCV: 93.2 fL (ref 80.0–100.0)
Monocytes Absolute: 0.3 10*3/uL (ref 0.1–1.0)
Monocytes Relative: 3 %
Neutro Abs: 9.6 10*3/uL — ABNORMAL HIGH (ref 1.7–7.7)
Neutrophils Relative %: 94 %
Platelet Count: 138 10*3/uL — ABNORMAL LOW (ref 150–400)
RBC: 3.07 MIL/uL — ABNORMAL LOW (ref 3.87–5.11)
RDW: 20.6 % — ABNORMAL HIGH (ref 11.5–15.5)
WBC Count: 10.2 10*3/uL (ref 4.0–10.5)
nRBC: 0 % (ref 0.0–0.2)

## 2022-01-19 LAB — CMP (CANCER CENTER ONLY)
ALT: 15 U/L (ref 0–44)
AST: 21 U/L (ref 15–41)
Albumin: 3.3 g/dL — ABNORMAL LOW (ref 3.5–5.0)
Alkaline Phosphatase: 54 U/L (ref 38–126)
Anion gap: 9 (ref 5–15)
BUN: 90 mg/dL — ABNORMAL HIGH (ref 8–23)
CO2: 21 mmol/L — ABNORMAL LOW (ref 22–32)
Calcium: 8.3 mg/dL — ABNORMAL LOW (ref 8.9–10.3)
Chloride: 103 mmol/L (ref 98–111)
Creatinine: 2.22 mg/dL — ABNORMAL HIGH (ref 0.44–1.00)
GFR, Estimated: 21 mL/min — ABNORMAL LOW (ref >60)
Glucose, Bld: 143 mg/dL — ABNORMAL HIGH (ref 70–99)
Potassium: 4.1 mmol/L (ref 3.5–5.1)
Sodium: 133 mmol/L — ABNORMAL LOW (ref 135–145)
Total Bilirubin: 0.5 mg/dL (ref 0.3–1.2)
Total Protein: 5.3 g/dL — ABNORMAL LOW (ref 6.5–8.1)

## 2022-01-19 LAB — RETICULOCYTES
Immature Retic Fract: 11.4 % (ref 2.3–15.9)
RBC.: 3.09 MIL/uL — ABNORMAL LOW (ref 3.87–5.11)
Retic Count, Absolute: 186.3 10*3/uL — ABNORMAL HIGH (ref 19.0–186.0)
Retic Ct Pct: 6 % — ABNORMAL HIGH (ref 0.4–3.1)

## 2022-01-19 LAB — SAMPLE TO BLOOD BANK

## 2022-01-23 ENCOUNTER — Other Ambulatory Visit: Payer: Self-pay

## 2022-01-23 ENCOUNTER — Ambulatory Visit: Payer: Medicare HMO

## 2022-01-23 ENCOUNTER — Inpatient Hospital Stay: Payer: Medicare HMO

## 2022-01-23 ENCOUNTER — Inpatient Hospital Stay: Payer: Medicare HMO | Admitting: Hematology and Oncology

## 2022-01-23 DIAGNOSIS — D594 Other nonautoimmune hemolytic anemias: Secondary | ICD-10-CM

## 2022-01-24 ENCOUNTER — Encounter: Payer: Self-pay | Admitting: Hematology and Oncology

## 2022-01-24 ENCOUNTER — Inpatient Hospital Stay: Payer: Medicare HMO

## 2022-01-24 ENCOUNTER — Inpatient Hospital Stay: Payer: Medicare HMO | Admitting: Hematology and Oncology

## 2022-01-24 ENCOUNTER — Other Ambulatory Visit: Payer: Self-pay

## 2022-01-24 VITALS — BP 166/44 | HR 65 | Temp 97.9°F | Resp 16 | Wt 133.8 lb

## 2022-01-24 DIAGNOSIS — D631 Anemia in chronic kidney disease: Secondary | ICD-10-CM | POA: Diagnosis not present

## 2022-01-24 DIAGNOSIS — N184 Chronic kidney disease, stage 4 (severe): Secondary | ICD-10-CM | POA: Diagnosis not present

## 2022-01-24 DIAGNOSIS — D591 Autoimmune hemolytic anemia, unspecified: Secondary | ICD-10-CM | POA: Diagnosis not present

## 2022-01-24 DIAGNOSIS — Z5112 Encounter for antineoplastic immunotherapy: Secondary | ICD-10-CM | POA: Diagnosis not present

## 2022-01-24 DIAGNOSIS — I7782 Antineutrophilic cytoplasmic antibody (ANCA) vasculitis: Secondary | ICD-10-CM | POA: Diagnosis not present

## 2022-01-24 DIAGNOSIS — E1122 Type 2 diabetes mellitus with diabetic chronic kidney disease: Secondary | ICD-10-CM | POA: Diagnosis not present

## 2022-01-24 DIAGNOSIS — R739 Hyperglycemia, unspecified: Secondary | ICD-10-CM | POA: Diagnosis not present

## 2022-01-24 DIAGNOSIS — M8580 Other specified disorders of bone density and structure, unspecified site: Secondary | ICD-10-CM | POA: Diagnosis not present

## 2022-01-24 DIAGNOSIS — D594 Other nonautoimmune hemolytic anemias: Secondary | ICD-10-CM

## 2022-01-24 DIAGNOSIS — D61818 Other pancytopenia: Secondary | ICD-10-CM

## 2022-01-24 LAB — CMP (CANCER CENTER ONLY)
ALT: 16 U/L (ref 0–44)
AST: 21 U/L (ref 15–41)
Albumin: 3.4 g/dL — ABNORMAL LOW (ref 3.5–5.0)
Alkaline Phosphatase: 51 U/L (ref 38–126)
Anion gap: 10 (ref 5–15)
BUN: 103 mg/dL — ABNORMAL HIGH (ref 8–23)
CO2: 21 mmol/L — ABNORMAL LOW (ref 22–32)
Calcium: 8.5 mg/dL — ABNORMAL LOW (ref 8.9–10.3)
Chloride: 98 mmol/L (ref 98–111)
Creatinine: 2.46 mg/dL — ABNORMAL HIGH (ref 0.44–1.00)
GFR, Estimated: 19 mL/min — ABNORMAL LOW (ref >60)
Glucose, Bld: 144 mg/dL — ABNORMAL HIGH (ref 70–99)
Potassium: 4.5 mmol/L (ref 3.5–5.1)
Sodium: 129 mmol/L — ABNORMAL LOW (ref 135–145)
Total Bilirubin: 0.5 mg/dL (ref 0.3–1.2)
Total Protein: 5.4 g/dL — ABNORMAL LOW (ref 6.5–8.1)

## 2022-01-24 LAB — RETIC PANEL
Immature Retic Fract: 2.2 % — ABNORMAL LOW (ref 2.3–15.9)
RBC.: 3.07 MIL/uL — ABNORMAL LOW (ref 3.87–5.11)
Retic Count, Absolute: 80.4 10*3/uL (ref 19.0–186.0)
Retic Ct Pct: 2.6 % (ref 0.4–3.1)
Reticulocyte Hemoglobin: 34.7 pg (ref >27.9)

## 2022-01-24 LAB — CBC WITH DIFFERENTIAL (CANCER CENTER ONLY)
Abs Immature Granulocytes: 0.02 10*3/uL (ref 0.00–0.07)
Basophils Absolute: 0 10*3/uL (ref 0.0–0.1)
Basophils Relative: 0 %
Eosinophils Absolute: 0 10*3/uL (ref 0.0–0.5)
Eosinophils Relative: 0 %
HCT: 28.4 % — ABNORMAL LOW (ref 36.0–46.0)
Hemoglobin: 9.7 g/dL — ABNORMAL LOW (ref 12.0–15.0)
Immature Granulocytes: 0 %
Lymphocytes Relative: 2 %
Lymphs Abs: 0.2 10*3/uL — ABNORMAL LOW (ref 0.7–4.0)
MCH: 31.4 pg (ref 26.0–34.0)
MCHC: 34.2 g/dL (ref 30.0–36.0)
MCV: 91.9 fL (ref 80.0–100.0)
Monocytes Absolute: 0.1 10*3/uL (ref 0.1–1.0)
Monocytes Relative: 1 %
Neutro Abs: 7.1 10*3/uL (ref 1.7–7.7)
Neutrophils Relative %: 97 %
Platelet Count: 123 10*3/uL — ABNORMAL LOW (ref 150–400)
RBC: 3.09 MIL/uL — ABNORMAL LOW (ref 3.87–5.11)
RDW: 18.6 % — ABNORMAL HIGH (ref 11.5–15.5)
WBC Count: 7.4 10*3/uL (ref 4.0–10.5)
nRBC: 0 % (ref 0.0–0.2)

## 2022-01-24 LAB — SAMPLE TO BLOOD BANK

## 2022-01-24 MED ORDER — ACETAMINOPHEN 325 MG PO TABS
650.0000 mg | ORAL_TABLET | Freq: Once | ORAL | Status: AC
  Administered 2022-01-24: 650 mg via ORAL
  Filled 2022-01-24: qty 2

## 2022-01-24 MED ORDER — DIPHENHYDRAMINE HCL 25 MG PO CAPS
25.0000 mg | ORAL_CAPSULE | Freq: Once | ORAL | Status: AC
  Administered 2022-01-24: 25 mg via ORAL
  Filled 2022-01-24: qty 1

## 2022-01-24 MED ORDER — SODIUM CHLORIDE 0.9 % IV SOLN: Freq: Once | INTRAVENOUS | Status: AC

## 2022-01-24 MED ORDER — SODIUM CHLORIDE 0.9 % IV SOLN
375.0000 mg/m2 | Freq: Once | INTRAVENOUS | Status: AC
  Administered 2022-01-24: 600 mg via INTRAVENOUS
  Filled 2022-01-24: qty 50

## 2022-01-24 NOTE — Patient Instructions (Signed)
[facility]  Discharge Instructions: ?Thank you for choosing [facility] to provide your oncology and hematology care.  ? ?If you have a lab appointment with the Cancer Center, please go directly to the Cancer Center and check in at the registration area. ?  ?Wear comfortable clothing and clothing appropriate for easy access to any Portacath or PICC line.  ? ?We strive to give you quality time with your provider. You may need to reschedule your appointment if you arrive late (15 or more minutes).  Arriving late affects you and other patients whose appointments are after yours.  Also, if you miss three or more appointments without notifying the office, you may be dismissed from the clinic at the provider?s discretion.    ?  ?For prescription refill requests, have your pharmacy contact our office and allow 72 hours for refills to be completed.   ? ?Today you received the following chemotherapy and/or immunotherapy agents: Ruxience ?  ?To help prevent nausea and vomiting after your treatment, we encourage you to take your nausea medication as directed. ? ?BELOW ARE SYMPTOMS THAT SHOULD BE REPORTED IMMEDIATELY: ?*FEVER GREATER THAN 100.4 F (38 ?C) OR HIGHER ?*CHILLS OR SWEATING ?*NAUSEA AND VOMITING THAT IS NOT CONTROLLED WITH YOUR NAUSEA MEDICATION ?*UNUSUAL SHORTNESS OF BREATH ?*UNUSUAL BRUISING OR BLEEDING ?*URINARY PROBLEMS (pain or burning when urinating, or frequent urination) ?*BOWEL PROBLEMS (unusual diarrhea, constipation, pain near the anus) ?TENDERNESS IN MOUTH AND THROAT WITH OR WITHOUT PRESENCE OF ULCERS (sore throat, sores in mouth, or a toothache) ?UNUSUAL RASH, SWELLING OR PAIN  ?UNUSUAL VAGINAL DISCHARGE OR ITCHING  ? ?Items with * indicate a potential emergency and should be followed up as soon as possible or go to the Emergency Department if any problems should occur. ? ?Please show the CHEMOTHERAPY ALERT CARD or IMMUNOTHERAPY ALERT CARD at check-in to the  Emergency Department and triage nurse. ? ?Should you have questions after your visit or need to cancel or reschedule your appointment, please contact [facility]  Dept: [phone]  and follow the prompts.  Office hours are 8:00 a.m. to 4:30 p.m. Monday - Friday. Please note that voicemails left after 4:00 p.m. may not be returned until the following business day.  We are closed weekends and major holidays. You have access to a nurse at all times for urgent questions. Please call the main number to the clinic Dept: [phone] and follow the prompts. ? ? ?For any non-urgent questions, you may also contact your provider using MyChart. We now offer e-Visits for anyone 18 and older to request care online for non-urgent symptoms. For details visit [email or web address]. ?  ?Also download the MyChart app! Go to the app store, search "MyChart", open the app, select [facility], and log in with your MyChart username and password. ? ?Due to Covid, a mask is required upon entering the hospital/clinic. If you do not have a mask, one will be given to you upon arrival. For doctor visits, patients may have 1 support person aged 18 or older with them. For treatment visits, patients cannot have anyone with them due to current Covid guidelines and our immunocompromised population.  ? ?

## 2022-01-24 NOTE — Progress Notes (Signed)
[name]: ?  ? ?[name], [name], MD ?[address] ?[place] [address] ? ?DIAGNOSIS:  ? ?SUMMARY OF HEMATOLOGIC HISTORY: ?81 y.o. [place] woman with multifactorial anemia, as follows: ?            (a) anemia of chronic inflammation: As of May 2020 she has a SED rate of 95, CRP 1.1, positive ANA and RF; subsequently she was found to be ANCA positive ?            (b) hemolysis (mild): she has a mildly elevated LDH and a DAT positive for complement, not IgG; this is c/w (a) above ?            (c) anemia of renal insufficiency: inadequate EPO resonse to anemia    ?                     (d) GIB/ iron deficiency in patient on lifelong anticoagulaton ?  ?(1) Feraheme received 03/01/2019, repeated 03/20/2019  ?            (a) reticulocyte 03/07/2019 [name] from 43.4 a year ago to 191.8 after iron infusion ?            (b) feraheme repeated on 05/12/2019 ?            (c) subsequent ferritin levels >100 ?  ?(2) darbepoetin: starting 05/05/2019 at 156mg given every 2 weeks ?            (a) dose increased to 200 mcg every week starting 07/01/2019  ?            (b) back to every 2 weeks beginning 08/04/2019 still at 200 mcg dose ?            (c) switched to Retacrit every 4 weeks as of 08/18/2019 ?  ?(3) ANCA positive vasculitis: diagnosed 05/08/2019 (titer 1:640) ?            (a) prednisone 40 mg daily started 05/31/2019 ?            (b) rituximab weekly started 06/12/2019 (received test dose 06/05/2019) ?                        (i) hepatitis B sAg, core Ab and HIV negative 05/08/2019 ?                        (ii) rituximab held after 07/14/2019 dose (received 5 weekly doses) with neutropenia ?                        (iii) rituximab resumed 08/18/2019 but changed to monthly ?                        (iv) rituximab changed to every 2 months beginning with 02/02/2020 dose ?                        (v) rituximab changed to every 12 weeks after the 10/12/2020 dose ?            (c) prednisone  tapered to off as of 09/15/2019 ?  ?(4) osteopenia with a T score of -1.5 on bone density 02/28/2016 at the breast center ? ?CURRENT THERAPY: Rituximab ? ?INTERVAL HISTORY: ? ?[name] returns for her follow-[name] of anemia. ? ?  [name] was seen in infusion.  She tells me today that she started feeling better, her energy levels are slowly coming back.  Her lower extremity swelling has been worse.  Sugars are still a bit out of control but slowly getting better.  She has been taking the prednisone as prescribed and tolerating it well.  She denies any bleeding issues.  ?Rest of  the pertinent 10 point ROS reviewed and negative. ? ? ?Patient Active Problem List  ? Diagnosis Date Noted  ? Hyperglycemia 01/09/2022  ? Autoimmune hemolytic anemia ([place]) 01/02/2022  ? Bilateral lower extremity edema 01/02/2022  ? Cold sore 01/02/2022  ? Symptomatic anemia 12/15/2021  ? Abnormal chest x-ray 12/15/2021  ? Supratherapeutic INR 12/15/2021  ? Elevated troponin 12/15/2021  ? Unilateral primary osteoarthritis, right hip 05/31/2021  ? Midline cystocele 05/30/2021  ? Primary localized osteoarthritis of pelvic region and thigh 05/30/2021  ? Vaginal pain 05/30/2021  ? Asymmetric SNHL (sensorineural hearing loss) 01/06/2021  ? Referred otalgia of both ears 01/06/2021  ? Bilateral hearing loss 08/31/2020  ? Bilateral impacted cerumen 08/31/2020  ? Low back pain 02/20/2020  ? CKD (chronic kidney disease), stage IV ([place]) 10/13/2019  ? Leukopenia due to antineoplastic chemotherapy ([place]) 08/18/2019  ? Chemotherapy induced neutropenia ([place]) 07/21/2019  ? Thrombophilia ([place]) 06/19/2019  ? Encounter for general adult medical examination without abnormal findings 06/13/2019  ? Arteritis ([place]) 06/06/2019  ? Hypertensive heart and chronic kidney disease with heart failure and stage 1 through stage 4 chronic kidney disease, or unspecified chronic kidney disease ([place]) 06/06/2019  ? Muscle weakness 06/06/2019  ? Pancytopenia ([place])  05/28/2019  ? Dyspnea 05/28/2019  ? Vasculitis, ANCA positive 05/28/2019  ? Disorder of connective tissue ([place]) 05/14/2019  ? Renal insufficiency 05/08/2019  ? Acute on chronic diastolic CHF (congestive heart failure) ([place]) 04/07/2019  ? Left leg cellulitis 04/07/2019  ? PAF (paroxysmal atrial fibrillation) ([place]) 04/07/2019  ? Iron deficiency anemia 02/28/2019  ? 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 ([place]) 02/28/2019  ? Diabetic retinopathy associated with type 2 diabetes mellitus ([place]) 09/02/2018  ? Chronic pain 06/19/2018  ? Non-thrombocytopenic purpura ([place]) 05/21/2018  ? Gout 03/22/2018  ? Malnutrition of moderate degree 03/12/2018  ? Acute on chronic diastolic heart failure ([place]) 03/06/2018  ? Diabetes mellitus type 2, controlled ([place]) 03/06/2018  ? S/P cholecystectomy 03/06/2018  ? HTN (hypertension) 03/06/2018  ? H/O mechanical aortic valve replacement 03/06/2018  ? Moderate to severe pulmonary hypertension ([place]) 03/06/2018  ? GERD (gastroesophageal reflux disease) 02/21/2018  ? Anxiety 02/21/2018  ? Chronic diastolic CHF (congestive heart failure) ([place]) 02/21/2018  ? Diarrhea 02/21/2018  ? General weakness 02/02/2018  ? Thrombocytopenia ([place]) 01/25/2018  ? Arm pain, diffuse 01/25/2018  ? Anemia 01/23/2018  ? Cholelithiasis 01/23/2018  ? Aortic atherosclerosis ([place]) 01/23/2018  ? GI bleed 01/18/2018  ? Age-related osteoporosis without current pathological fracture 05/24/2017  ? Carotid artery occlusion 05/24/2017  ? Generalized anxiety disorder 05/24/2017  ? Hearing loss 05/24/2017  ? Presence of prosthetic heart valve 05/24/2017  ? Coronary artery disease 08/18/2013  ? Carotid artery disease ([place]) 08/18/2013  ? Peripheral arterial disease ([place]) 08/18/2013  ? Diabetes mellitus without complication ([place]) 65/99/3570  ? Benign positional vertigo 10/06/2011  ? Hyponatremia 10/06/2011  ? Hypertension   ? Hypercholesteremia   ? Mechanical heart valve present   ? Chronic anticoagulation    ? ? ?is allergic to flagyl [metronidazole], ciprofloxacin, coreg [carvedilol], losartan, sulfamethoxazole-trimethoprim, verapamil, zetia [ezetimibe], zocor [simvastatin - high dose], and  gatifloxacin. ? ?MEDICAL HISTORY: ?Past Medical History:  ?Diagnosis Date  ? Aortic atherosclerosis ([place]) 01/23/2018  ? Atrial fibrillation ([place])   ? Carotid artery disease ([place])   ? Cholelithiasis 01/23/2018  ? Chronic anticoagulation   ? Coronary artery disease   ? status post coronary artery bypass grafting times 07/10/2004  ? Diabetes mellitus   ? Hypercholesteremia   ? Hypertension   ? Mechanical heart valve present   ? H. aortic valve replacement at the time of bypass surgery October 2005  ? Moderate to severe pulmonary hypertension (HCC)   ? Peripheral arterial disease ([place])   ? history of left common iliac artery PTA and stenting for a chronic total occlusion 08/26/01  ? ? ?SURGICAL HISTORY: ?Past Surgical History:  ?Procedure Laterality Date  ? anterior repair  2009  ? AORTIC VALVE REPLACEMENT  2005  ? Dr. Cyndia Bent  ? CARDIAC CATHETERIZATION  11/10/2004  ? 40% right common illiac, 70% in stent restenosis of distal left common illiac,   ? CARDIAC CATHETERIZATION  05/18/2004  ? LAD 50-70% midstenosis, RCA dominant w/50% stenosis, 50% Right common Illiac artery ostial stenosis, 90% in stent restenosis within midportion of left common illiac stent  ? Carotid Duplex  03/12/2012  ? RSA-elev. velocities suggestive of a 50-69% diameter reduction, Right&Left Bulb/Prox ICA-mild-mod.fibrous plaqueelevating Velocities abnormal study.  ? CHOLECYSTECTOMY N/A 03/01/2018  ? Procedure: LAPAROSCOPIC CHOLECYSTECTOMY;  Surgeon: Judeth Horn, MD;  Location: [facility];  Service: General;  Laterality: N/A;  ? COLONOSCOPY WITH PROPOFOL N/A 01/22/2018  ? Procedure: COLONOSCOPY WITH PROPOFOL;  Surgeon: Wilford Corner, MD;  Location: [facility];  Service: Endoscopy;  Laterality: N/A;  ? ESOPHAGOGASTRODUODENOSCOPY (EGD) WITH PROPOFOL N/A 01/22/2018  ?  Procedure: ESOPHAGOGASTRODUODENOSCOPY (EGD) WITH PROPOFOL;  Surgeon: Wilford Corner, MD;  Location: [facility];  Service: Endoscopy;  Laterality: N/A;  ? ESOPHAGOGASTRODUODENOSCOPY (EGD) WITH PROPOFOL N/A 2

## 2022-01-24 NOTE — Progress Notes (Signed)
OK to trt w/ elevated SCR of 2.46 today per Dr. Chryl Heck ?

## 2022-01-25 DIAGNOSIS — I5032 Chronic diastolic (congestive) heart failure: Secondary | ICD-10-CM | POA: Diagnosis not present

## 2022-01-25 DIAGNOSIS — Z1331 Encounter for screening for depression: Secondary | ICD-10-CM | POA: Diagnosis not present

## 2022-01-25 DIAGNOSIS — E11319 Type 2 diabetes mellitus with unspecified diabetic retinopathy without macular edema: Secondary | ICD-10-CM | POA: Diagnosis not present

## 2022-01-25 DIAGNOSIS — Z1339 Encounter for screening examination for other mental health and behavioral disorders: Secondary | ICD-10-CM | POA: Diagnosis not present

## 2022-01-25 DIAGNOSIS — E1151 Type 2 diabetes mellitus with diabetic peripheral angiopathy without gangrene: Secondary | ICD-10-CM | POA: Diagnosis not present

## 2022-01-25 DIAGNOSIS — Z7901 Long term (current) use of anticoagulants: Secondary | ICD-10-CM | POA: Diagnosis not present

## 2022-01-25 DIAGNOSIS — I130 Hypertensive heart and chronic kidney disease with heart failure and stage 1 through stage 4 chronic kidney disease, or unspecified chronic kidney disease: Secondary | ICD-10-CM | POA: Diagnosis not present

## 2022-01-25 DIAGNOSIS — I482 Chronic atrial fibrillation, unspecified: Secondary | ICD-10-CM | POA: Diagnosis not present

## 2022-01-25 DIAGNOSIS — I739 Peripheral vascular disease, unspecified: Secondary | ICD-10-CM | POA: Diagnosis not present

## 2022-01-25 DIAGNOSIS — I2510 Atherosclerotic heart disease of native coronary artery without angina pectoris: Secondary | ICD-10-CM | POA: Diagnosis not present

## 2022-01-25 DIAGNOSIS — I700 Atherosclerosis of aorta: Secondary | ICD-10-CM | POA: Diagnosis not present

## 2022-01-26 ENCOUNTER — Other Ambulatory Visit: Payer: Self-pay

## 2022-01-26 DIAGNOSIS — D591 Autoimmune hemolytic anemia, unspecified: Secondary | ICD-10-CM

## 2022-01-27 ENCOUNTER — Ambulatory Visit (INDEPENDENT_AMBULATORY_CARE_PROVIDER_SITE_OTHER): Payer: Medicare HMO

## 2022-01-27 DIAGNOSIS — Z952 Presence of prosthetic heart valve: Secondary | ICD-10-CM

## 2022-01-27 DIAGNOSIS — Z7901 Long term (current) use of anticoagulants: Secondary | ICD-10-CM

## 2022-01-27 LAB — POCT INR: INR: 3.2 — AB (ref 2.0–3.0)

## 2022-01-27 NOTE — Patient Instructions (Signed)
Description   ?Hold today's dose and then continue taking 1 tablet daily except 1.5 tablets on Sundays, Tuesdays and Thursdays.   ?Recheck in 1 week.   ?Call with Prednisone dosage changes, currently on '30mg'$  daily.  ?Please keep green leafy veggies and boost in your diet.  ?Call with any new meds [phone] & if you have any procedures where you need to hold warfarin, have them fax a clearance form to [phone]. ? ? ?  ?   ?

## 2022-01-29 NOTE — Progress Notes (Signed)
[name]: ?  ? ?[name], [name], MD ?[address] ?[place] [address] ? ?DIAGNOSIS:  ? ?SUMMARY OF HEMATOLOGIC HISTORY: ?81 y.o. [place] woman with multifactorial anemia, as follows: ?            (a) anemia of chronic inflammation: As of May 2020 she has a SED rate of 95, CRP 1.1, positive ANA and RF; subsequently she was found to be ANCA positive ?            (b) hemolysis (mild): she has a mildly elevated LDH and a DAT positive for complement, not IgG; this is c/w (a) above ?            (c) anemia of renal insufficiency: inadequate EPO resonse to anemia    ?                     (d) GIB/ iron deficiency in patient on lifelong anticoagulaton ?  ?(1) Feraheme received 03/01/2019, repeated 03/20/2019  ?            (a) reticulocyte 03/07/2019 [name] from 43.4 a year ago to 191.8 after iron infusion ?            (b) feraheme repeated on 05/12/2019 ?            (c) subsequent ferritin levels >100 ?  ?(2) darbepoetin: starting 05/05/2019 at 111mg given every 2 weeks ?            (a) dose increased to 200 mcg every week starting 07/01/2019  ?            (b) back to every 2 weeks beginning 08/04/2019 still at 200 mcg dose ?            (c) switched to Retacrit every 4 weeks as of 08/18/2019 ?  ?(3) ANCA positive vasculitis: diagnosed 05/08/2019 (titer 1:640) ?            (a) prednisone 40 mg daily started 05/31/2019 ?            (b) rituximab weekly started 06/12/2019 (received test dose 06/05/2019) ?                        (i) hepatitis B sAg, core Ab and HIV negative 05/08/2019 ?                        (ii) rituximab held after 07/14/2019 dose (received 5 weekly doses) with neutropenia ?                        (iii) rituximab resumed 08/18/2019 but changed to monthly ?                        (iv) rituximab changed to every 2 months beginning with 02/02/2020 dose ?                        (v) rituximab changed to every 12 weeks after the 10/12/2020 dose ?            (c) prednisone  tapered to off as of 09/15/2019 ?  ?(4) osteopenia with a T score of -1.5 on bone density 02/28/2016 at the breast center ? ?CURRENT THERAPY: Rituximab ? ?INTERVAL HISTORY: ? ?[name] 81y.o. female returns for her follow-[name] of anemia. ? ?  [name] was on maintenance rituximab for autoimmune hemolytic anemia. ?She was on rituximab every other month and she was recommended to switch it to every 3 months.  ?She then had an relapse of autoimmune hemolytic anemia. ?She is now back on prednisone, currently going through a taper and is here for follow [name]. ?She is feeling very tired, no sleep, gaining weight, and pain in the mouth. ?No change in breathing, bowel or urinary habits. ? ?Rest of  the pertinent 10 point ROS reviewed and negative. ? ? ?Patient Active Problem List  ? Diagnosis Date Noted  ? Hyperglycemia 01/09/2022  ? Autoimmune hemolytic anemia ([place]) 01/02/2022  ? Bilateral lower extremity edema 01/02/2022  ? Cold sore 01/02/2022  ? Symptomatic anemia 12/15/2021  ? Abnormal chest x-ray 12/15/2021  ? Supratherapeutic INR 12/15/2021  ? Elevated troponin 12/15/2021  ? Unilateral primary osteoarthritis, right hip 05/31/2021  ? Midline cystocele 05/30/2021  ? Primary localized osteoarthritis of pelvic region and thigh 05/30/2021  ? Vaginal pain 05/30/2021  ? Asymmetric SNHL (sensorineural hearing loss) 01/06/2021  ? Referred otalgia of both ears 01/06/2021  ? Bilateral hearing loss 08/31/2020  ? Bilateral impacted cerumen 08/31/2020  ? Low back pain 02/20/2020  ? CKD (chronic kidney disease), stage IV ([place]) 10/13/2019  ? Leukopenia due to antineoplastic chemotherapy ([place]) 08/18/2019  ? Chemotherapy induced neutropenia ([place]) 07/21/2019  ? Thrombophilia ([place]) 06/19/2019  ? Encounter for general adult medical examination without abnormal findings 06/13/2019  ? Arteritis ([place]) 06/06/2019  ? Hypertensive heart and chronic kidney disease with heart failure and stage 1 through stage 4 chronic kidney disease, or  unspecified chronic kidney disease ([place]) 06/06/2019  ? Muscle weakness 06/06/2019  ? Pancytopenia ([place]) 05/28/2019  ? Dyspnea 05/28/2019  ? Vasculitis, ANCA positive 05/28/2019  ? Disorder of connective tissue ([place]) 05/14/2019  ? Renal insufficiency 05/08/2019  ? Acute on chronic diastolic CHF (congestive heart failure) ([place]) 04/07/2019  ? Left leg cellulitis 04/07/2019  ? PAF (paroxysmal atrial fibrillation) ([place]) 04/07/2019  ? Iron deficiency anemia 02/28/2019  ? 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 ([place]) 02/28/2019  ? Diabetic retinopathy associated with type 2 diabetes mellitus ([place]) 09/02/2018  ? Chronic pain 06/19/2018  ? Non-thrombocytopenic purpura ([place]) 05/21/2018  ? Gout 03/22/2018  ? Malnutrition of moderate degree 03/12/2018  ? Acute on chronic diastolic heart failure ([place]) 03/06/2018  ? Diabetes mellitus type 2, controlled ([place]) 03/06/2018  ? S/P cholecystectomy 03/06/2018  ? HTN (hypertension) 03/06/2018  ? H/O mechanical aortic valve replacement 03/06/2018  ? Moderate to severe pulmonary hypertension ([place]) 03/06/2018  ? GERD (gastroesophageal reflux disease) 02/21/2018  ? Anxiety 02/21/2018  ? Chronic diastolic CHF (congestive heart failure) ([place]) 02/21/2018  ? Diarrhea 02/21/2018  ? General weakness 02/02/2018  ? Thrombocytopenia ([place]) 01/25/2018  ? Arm pain, diffuse 01/25/2018  ? Anemia 01/23/2018  ? Cholelithiasis 01/23/2018  ? Aortic atherosclerosis ([place]) 01/23/2018  ? GI bleed 01/18/2018  ? Age-related osteoporosis without current pathological fracture 05/24/2017  ? Carotid artery occlusion 05/24/2017  ? Generalized anxiety disorder 05/24/2017  ? Hearing loss 05/24/2017  ? Presence of prosthetic heart valve 05/24/2017  ? Coronary artery disease 08/18/2013  ? Carotid artery disease ([place]) 08/18/2013  ? Peripheral arterial disease ([place]) 08/18/2013  ? Diabetes mellitus without complication ([place]) 33/00/7622  ? Benign positional vertigo 10/06/2011  ? Hyponatremia 10/06/2011  ?  Hypertension   ? Hypercholesteremia   ? Mechanical heart valve present   ? Chronic anticoagulation   ? ? ?is allergic to flagyl [metronidazole],  ciprofloxacin, coreg [carvedilol], losartan, sulfamethoxazole-trimethoprim, verapamil, zetia [ezetimibe], zocor [simvastatin - high dose], and gatifloxacin. ? ?MEDICAL HISTORY: ?Past Medical History:  ?Diagnosis Date  ? Aortic atherosclerosis ([place]) 01/23/2018  ? Atrial fibrillation ([place])   ? Carotid artery disease ([place])   ? Cholelithiasis 01/23/2018  ? Chronic anticoagulation   ? Coronary artery disease   ? status post coronary artery bypass grafting times 07/10/2004  ? Diabetes mellitus   ? Hypercholesteremia   ? Hypertension   ? Mechanical heart valve present   ? H. aortic valve replacement at the time of bypass surgery October 2005  ? Moderate to severe pulmonary hypertension (HCC)   ? Peripheral arterial disease ([place])   ? history of left common iliac artery PTA and stenting for a chronic total occlusion 08/26/01  ? ? ?SURGICAL HISTORY: ?Past Surgical History:  ?Procedure Laterality Date  ? anterior repair  2009  ? AORTIC VALVE REPLACEMENT  2005  ? Dr. Cyndia Bent  ? CARDIAC CATHETERIZATION  11/10/2004  ? 40% right common illiac, 70% in stent restenosis of distal left common illiac,   ? CARDIAC CATHETERIZATION  05/18/2004  ? LAD 50-70% midstenosis, RCA dominant w/50% stenosis, 50% Right common Illiac artery ostial stenosis, 90% in stent restenosis within midportion of left common illiac stent  ? Carotid Duplex  03/12/2012  ? RSA-elev. velocities suggestive of a 50-69% diameter reduction, Right&Left Bulb/Prox ICA-mild-mod.fibrous plaqueelevating Velocities abnormal study.  ? CHOLECYSTECTOMY N/A 03/01/2018  ? Procedure: LAPAROSCOPIC CHOLECYSTECTOMY;  Surgeon: Judeth Horn, MD;  Location: [facility];  Service: General;  Laterality: N/A;  ? COLONOSCOPY WITH PROPOFOL N/A 01/22/2018  ? Procedure: COLONOSCOPY WITH PROPOFOL;  Surgeon: Wilford Corner, MD;  Location: [facility];  Service:  Endoscopy;  Laterality: N/A;  ? ESOPHAGOGASTRODUODENOSCOPY (EGD) WITH PROPOFOL N/A 01/22/2018  ? Procedure: ESOPHAGOGASTRODUODENOSCOPY (EGD) WITH PROPOFOL;  Surgeon: Wilford Corner, MD;  Location: [facility]

## 2022-01-30 ENCOUNTER — Encounter: Payer: Self-pay | Admitting: Hematology and Oncology

## 2022-01-30 ENCOUNTER — Inpatient Hospital Stay: Payer: Medicare HMO

## 2022-01-30 ENCOUNTER — Other Ambulatory Visit: Payer: Self-pay

## 2022-01-30 ENCOUNTER — Inpatient Hospital Stay: Payer: Medicare HMO | Admitting: Hematology and Oncology

## 2022-01-30 VITALS — BP 148/41 | HR 66 | Temp 97.5°F | Resp 18 | Ht 59.0 in | Wt 138.2 lb

## 2022-01-30 VITALS — BP 153/59 | HR 66 | Temp 97.9°F | Resp 18

## 2022-01-30 DIAGNOSIS — E1122 Type 2 diabetes mellitus with diabetic chronic kidney disease: Secondary | ICD-10-CM | POA: Diagnosis not present

## 2022-01-30 DIAGNOSIS — D61818 Other pancytopenia: Secondary | ICD-10-CM

## 2022-01-30 DIAGNOSIS — D591 Autoimmune hemolytic anemia, unspecified: Secondary | ICD-10-CM

## 2022-01-30 DIAGNOSIS — D631 Anemia in chronic kidney disease: Secondary | ICD-10-CM | POA: Diagnosis not present

## 2022-01-30 DIAGNOSIS — M8580 Other specified disorders of bone density and structure, unspecified site: Secondary | ICD-10-CM | POA: Diagnosis not present

## 2022-01-30 DIAGNOSIS — I7782 Antineutrophilic cytoplasmic antibody (ANCA) vasculitis: Secondary | ICD-10-CM | POA: Diagnosis not present

## 2022-01-30 DIAGNOSIS — Z5112 Encounter for antineoplastic immunotherapy: Secondary | ICD-10-CM | POA: Diagnosis not present

## 2022-01-30 DIAGNOSIS — R739 Hyperglycemia, unspecified: Secondary | ICD-10-CM | POA: Diagnosis not present

## 2022-01-30 DIAGNOSIS — N184 Chronic kidney disease, stage 4 (severe): Secondary | ICD-10-CM | POA: Diagnosis not present

## 2022-01-30 LAB — CMP (CANCER CENTER ONLY)
ALT: 27 U/L (ref 0–44)
AST: 29 U/L (ref 15–41)
Albumin: 3.2 g/dL — ABNORMAL LOW (ref 3.5–5.0)
Alkaline Phosphatase: 50 U/L (ref 38–126)
Anion gap: 9 (ref 5–15)
BUN: 105 mg/dL — ABNORMAL HIGH (ref 8–23)
CO2: 21 mmol/L — ABNORMAL LOW (ref 22–32)
Calcium: 8.1 mg/dL — ABNORMAL LOW (ref 8.9–10.3)
Chloride: 100 mmol/L (ref 98–111)
Creatinine: 2.43 mg/dL — ABNORMAL HIGH (ref 0.44–1.00)
GFR, Estimated: 19 mL/min — ABNORMAL LOW (ref >60)
Glucose, Bld: 174 mg/dL — ABNORMAL HIGH (ref 70–99)
Potassium: 4.5 mmol/L (ref 3.5–5.1)
Sodium: 130 mmol/L — ABNORMAL LOW (ref 135–145)
Total Bilirubin: 0.5 mg/dL (ref 0.3–1.2)
Total Protein: 4.9 g/dL — ABNORMAL LOW (ref 6.5–8.1)

## 2022-01-30 LAB — CBC WITH DIFFERENTIAL (CANCER CENTER ONLY)
Abs Immature Granulocytes: 0.04 10*3/uL (ref 0.00–0.07)
Basophils Absolute: 0 10*3/uL (ref 0.0–0.1)
Basophils Relative: 0 %
Eosinophils Absolute: 0 10*3/uL (ref 0.0–0.5)
Eosinophils Relative: 1 %
HCT: 23.3 % — ABNORMAL LOW (ref 36.0–46.0)
Hemoglobin: 7.9 g/dL — ABNORMAL LOW (ref 12.0–15.0)
Immature Granulocytes: 1 %
Lymphocytes Relative: 4 %
Lymphs Abs: 0.3 10*3/uL — ABNORMAL LOW (ref 0.7–4.0)
MCH: 31.1 pg (ref 26.0–34.0)
MCHC: 33.9 g/dL (ref 30.0–36.0)
MCV: 91.7 fL (ref 80.0–100.0)
Monocytes Absolute: 0.3 10*3/uL (ref 0.1–1.0)
Monocytes Relative: 4 %
Neutro Abs: 6.1 10*3/uL (ref 1.7–7.7)
Neutrophils Relative %: 90 %
Platelet Count: 107 10*3/uL — ABNORMAL LOW (ref 150–400)
RBC: 2.54 MIL/uL — ABNORMAL LOW (ref 3.87–5.11)
RDW: 17.6 % — ABNORMAL HIGH (ref 11.5–15.5)
WBC Count: 6.7 10*3/uL (ref 4.0–10.5)
nRBC: 0 % (ref 0.0–0.2)

## 2022-01-30 LAB — RETIC PANEL
Immature Retic Fract: 3.8 % (ref 2.3–15.9)
RBC.: 2.53 MIL/uL — ABNORMAL LOW (ref 3.87–5.11)
Retic Count, Absolute: 66.3 10*3/uL (ref 19.0–186.0)
Retic Ct Pct: 2.6 % (ref 0.4–3.1)
Reticulocyte Hemoglobin: 35 pg (ref >27.9)

## 2022-01-30 MED ORDER — ACETAMINOPHEN 325 MG PO TABS
650.0000 mg | ORAL_TABLET | Freq: Once | ORAL | Status: AC
  Administered 2022-01-30: 650 mg via ORAL
  Filled 2022-01-30: qty 2

## 2022-01-30 MED ORDER — PREDNISONE 20 MG PO TABS: 20.0000 mg | ORAL_TABLET | Freq: Every day | ORAL | Status: DC

## 2022-01-30 MED ORDER — LIDOCAINE VISCOUS HCL 2 % MT SOLN: 15.0000 mL | OROMUCOSAL | 2 refills | Status: DC | PRN

## 2022-01-30 MED ORDER — SODIUM CHLORIDE 0.9 % IV SOLN
375.0000 mg/m2 | Freq: Once | INTRAVENOUS | Status: AC
  Administered 2022-01-30: 600 mg via INTRAVENOUS
  Filled 2022-01-30: qty 50

## 2022-01-30 MED ORDER — PREDNISONE 20 MG PO TABS: 20.0000 mg | ORAL_TABLET | Freq: Every day | ORAL | 0 refills | Status: DC

## 2022-01-30 MED ORDER — DIPHENHYDRAMINE HCL 25 MG PO CAPS
25.0000 mg | ORAL_CAPSULE | Freq: Once | ORAL | Status: AC
  Administered 2022-01-30: 25 mg via ORAL
  Filled 2022-01-30: qty 1

## 2022-01-30 MED ORDER — SODIUM CHLORIDE 0.9 % IV SOLN: Freq: Once | INTRAVENOUS | Status: AC

## 2022-01-30 NOTE — Patient Instructions (Signed)
[facility]  Discharge Instructions: ?Thank you for choosing [facility] to provide your oncology and hematology care.  ? ?If you have a lab appointment with the Cancer Center, please go directly to the Cancer Center and check in at the registration area. ?  ?Wear comfortable clothing and clothing appropriate for easy access to any Portacath or PICC line.  ? ?We strive to give you quality time with your provider. You may need to reschedule your appointment if you arrive late (15 or more minutes).  Arriving late affects you and other patients whose appointments are after yours.  Also, if you miss three or more appointments without notifying the office, you may be dismissed from the clinic at the provider?s discretion.    ?  ?For prescription refill requests, have your pharmacy contact our office and allow 72 hours for refills to be completed.   ? ?Today you received the following chemotherapy and/or immunotherapy agents: Ruxience ?  ?To help prevent nausea and vomiting after your treatment, we encourage you to take your nausea medication as directed. ? ?BELOW ARE SYMPTOMS THAT SHOULD BE REPORTED IMMEDIATELY: ?*FEVER GREATER THAN 100.4 F (38 ?C) OR HIGHER ?*CHILLS OR SWEATING ?*NAUSEA AND VOMITING THAT IS NOT CONTROLLED WITH YOUR NAUSEA MEDICATION ?*UNUSUAL SHORTNESS OF BREATH ?*UNUSUAL BRUISING OR BLEEDING ?*URINARY PROBLEMS (pain or burning when urinating, or frequent urination) ?*BOWEL PROBLEMS (unusual diarrhea, constipation, pain near the anus) ?TENDERNESS IN MOUTH AND THROAT WITH OR WITHOUT PRESENCE OF ULCERS (sore throat, sores in mouth, or a toothache) ?UNUSUAL RASH, SWELLING OR PAIN  ?UNUSUAL VAGINAL DISCHARGE OR ITCHING  ? ?Items with * indicate a potential emergency and should be followed up as soon as possible or go to the Emergency Department if any problems should occur. ? ?Please show the CHEMOTHERAPY ALERT CARD or IMMUNOTHERAPY ALERT CARD at check-in to the  Emergency Department and triage nurse. ? ?Should you have questions after your visit or need to cancel or reschedule your appointment, please contact [facility]  Dept: [phone]  and follow the prompts.  Office hours are 8:00 a.m. to 4:30 p.m. Monday - Friday. Please note that voicemails left after 4:00 p.m. may not be returned until the following business day.  We are closed weekends and major holidays. You have access to a nurse at all times for urgent questions. Please call the main number to the clinic Dept: [phone] and follow the prompts. ? ? ?For any non-urgent questions, you may also contact your provider using MyChart. We now offer e-Visits for anyone 18 and older to request care online for non-urgent symptoms. For details visit [email or web address]. ?  ?Also download the MyChart app! Go to the app store, search "MyChart", open the app, select [facility], and log in with your MyChart username and password. ? ?Due to Covid, a mask is required upon entering the hospital/clinic. If you do not have a mask, one will be given to you upon arrival. For doctor visits, patients may have 1 support person aged 18 or older with them. For treatment visits, patients cannot have anyone with them due to current Covid guidelines and our immunocompromised population.  ? ?

## 2022-01-30 NOTE — Addendum Note (Signed)
Addended by: Adaline Sill on: 01/30/2022 02:24 PM ? ? Modules accepted: Orders ? ?

## 2022-01-30 NOTE — Addendum Note (Signed)
Addended by: Adaline Sill on: 01/30/2022 02:21 PM ? ? Modules accepted: Orders ? ?

## 2022-01-31 ENCOUNTER — Ambulatory Visit: Payer: Medicare HMO

## 2022-01-31 ENCOUNTER — Other Ambulatory Visit: Payer: Self-pay

## 2022-01-31 NOTE — Patient Instructions (Signed)
Patient Goals/Self-Care Activities: Heart Failure and Diabetes ?Take all medications as prescribed ?Attend all scheduled provider appointments ?call office if I gain more than 2 pounds in one day or 5 pounds in one week ?track weight in diary ?use salt in moderation ?watch for swelling in feet, ankles and legs every day ?weigh myself daily ?follow rescue plan if symptoms flare-up ?check blood sugar at prescribed times: twice daily ?enter blood sugar readings and medication or insulin into daily log ?take the blood sugar log to all doctor visits ?manage portion size ?Limit food high in carbohydrates such as rice, bread, potatoes, pasta ?

## 2022-01-31 NOTE — Patient Outreach (Signed)
[facility]) Care Management ? ?01/31/2022 ? ?[name] ?1940-12-23 ?[id number] ? ? ?Telephone call to patient for follow up. Patient reports she is continuing with prednisone working with pharmacist on medication adjustment for sugar control.  Highest sugar reported now is 369.  Discussed sugar control with taking prednisone.  Patient goes for blood work on Friday to check if her hemoglobin continues to drop.  If so, she will be needing a blood transfusion.  Heart failure management continues with last weight 127 lbs.   ? ?Care Plan : Heart Failure (Adult)  ?Updates made by [name] Billings, RN since 01/31/2022 12:00 AM  ?Completed 01/31/2022  ? ?Problem: Symptom Exacerbation (Heart Failure) Resolved 01/31/2022  ?  ? ?Care Plan : RN CM Plan of Care  ?Updates made by [name] Billings, RN since 01/31/2022 12:00 AM  ?  ? ?Problem: Chronic disease management and care coordination needs of HF and DM   ?Priority: High  ?  ? ?Long-Range Goal: Development of plan of care for management of HF and DM   ?Start Date: 01/13/2022  ?Expected End Date: 10/08/2022  ?This Visit's Progress: On track  ?Recent Progress: On track  ?Priority: High  ?Note:   ?Current Barriers:  ?Chronic Disease Management support and education needs related to CHF and DMII  ? ?RNCM Clinical Goal(s):  ?Patient will verbalize basic understanding of  CHF and DMII disease process and self health management plan as evidenced by No acute exacerbation of heart failure and blood sugars less than 300 ?continue to work with RN Care Manager to address care management and care coordination needs related to  CHF and DMII as evidenced by adherence to CM Team Scheduled appointments through collaboration with RN Care manager, provider, and care team.  ? ?Interventions: ?Education and support related to heart failure and diabetes ?Inter-disciplinary care team collaboration (see longitudinal plan of care) ?Evaluation of current treatment plan related to  self  management and patient's adherence to plan as established by provider ? ? ?Heart Failure Interventions:  (Status:  Goal on track:  Yes.) Long Term Goal ?Basic overview and discussion of pathophysiology of Heart Failure reviewed ?Provided education on low sodium diet ?Discussed importance of daily weight and advised patient to weigh and record daily ? ?Diabetes Interventions:  (Status:  New goal.) Long Term Goal ?Assessed patient's understanding of A1c goal: <7% ?Provided education to patient about basic DM disease process ?Reviewed medications with patient and discussed importance of medication adherence ?Discussed plans with patient for ongoing care management follow up and provided patient with direct contact information for care management team ?Discussed importance of monitoring carbohydrates and sugars.  ?Lab Results  ?Component Value Date  ? HGBA1C 4.6 (L) 02/24/2019  ?01/13/22 Patient having some problems with her anemia.  She is on prednisone and has had a blood transfusion.  Patient reports she is now on tresiba for her sugars 14 units daily.  Patient reports blood sugar however this morning was 253.  She is concerned with highs and will be calling physician to reports sugars and next steps.   Discussed importance of tight carbohydrate control to help with her sugars.  Weight up to 131 lbs. However, due to patient on prednisone explains weight gain.  CM reiterated signs of heart failure.   ? ?01/31/22 Patient continues to deal with her anemia.  However, prednisone has started to be tapered. Patient working with pharmacist to adjust [name] accordingly.  Patient reports highest sugar was 369 in the last 7-8  days.  Discussed affects of prednisone on sugars.  Patient weight today 127 lbs.  Patient continues with heart failure management such as no salt, daily weights, and watching for swelling.   ? ?Patient Goals/Self-Care Activities: Heart Failure and Diabetes ?Take all medications as prescribed ?Attend all  scheduled provider appointments ?call office if I gain more than 2 pounds in one day or 5 pounds in one week ?track weight in diary ?use salt in moderation ?watch for swelling in feet, ankles and legs every day ?weigh myself daily ?follow rescue plan if symptoms flare-up ?check blood sugar at prescribed times: twice daily ?enter blood sugar readings and medication or insulin into daily log ?take the blood sugar log to all doctor visits ?manage portion size ?Limit food high in carbohydrates such as rice, bread, potatoes, pasta ? ?Follow Up Plan:  Telephone follow up appointment with care management team member scheduled for:  May ?The patient has been provided with contact information for the care management team and has been advised to call with any health related questions or concerns.  ? ?  ? ?Plan: Follow-up: Patient agrees to Care Plan and Follow-up. ?Follow-up in May. ? ?[name] Baseman, RN, MSN ?[facility] Care Management ?Care Management Coordinator ?Direct Line [phone] ?Toll Free: [phone]  ?Fax: [phone] ? ?

## 2022-02-02 ENCOUNTER — Other Ambulatory Visit: Payer: Self-pay

## 2022-02-02 ENCOUNTER — Emergency Department (HOSPITAL_COMMUNITY)
Admission: EM | Admit: 2022-02-02 | Discharge: 2022-02-02 | Disposition: A | Discharge status: Home / Self Care | Payer: Medicare HMO | Source: Home / Self Care | Attending: Emergency Medicine | Admitting: Emergency Medicine

## 2022-02-02 ENCOUNTER — Emergency Department (HOSPITAL_COMMUNITY): Payer: Medicare HMO

## 2022-02-02 ENCOUNTER — Telehealth: Payer: Self-pay | Admitting: *Deleted

## 2022-02-02 ENCOUNTER — Ambulatory Visit (INDEPENDENT_AMBULATORY_CARE_PROVIDER_SITE_OTHER): Payer: Medicare HMO | Admitting: *Deleted

## 2022-02-02 ENCOUNTER — Encounter: Payer: Self-pay | Admitting: Hematology and Oncology

## 2022-02-02 DIAGNOSIS — E871 Hypo-osmolality and hyponatremia: Secondary | ICD-10-CM | POA: Diagnosis present

## 2022-02-02 DIAGNOSIS — R739 Hyperglycemia, unspecified: Secondary | ICD-10-CM | POA: Diagnosis not present

## 2022-02-02 DIAGNOSIS — I482 Chronic atrial fibrillation, unspecified: Secondary | ICD-10-CM | POA: Diagnosis not present

## 2022-02-02 DIAGNOSIS — N184 Chronic kidney disease, stage 4 (severe): Secondary | ICD-10-CM | POA: Diagnosis present

## 2022-02-02 DIAGNOSIS — R0602 Shortness of breath: Secondary | ICD-10-CM | POA: Diagnosis not present

## 2022-02-02 DIAGNOSIS — Z952 Presence of prosthetic heart valve: Secondary | ICD-10-CM

## 2022-02-02 DIAGNOSIS — E669 Obesity, unspecified: Secondary | ICD-10-CM | POA: Diagnosis present

## 2022-02-02 DIAGNOSIS — E875 Hyperkalemia: Secondary | ICD-10-CM | POA: Diagnosis present

## 2022-02-02 DIAGNOSIS — R600 Localized edema: Secondary | ICD-10-CM | POA: Diagnosis not present

## 2022-02-02 DIAGNOSIS — Z9049 Acquired absence of other specified parts of digestive tract: Secondary | ICD-10-CM | POA: Diagnosis not present

## 2022-02-02 DIAGNOSIS — Z954 Presence of other heart-valve replacement: Secondary | ICD-10-CM | POA: Insufficient documentation

## 2022-02-02 DIAGNOSIS — K219 Gastro-esophageal reflux disease without esophagitis: Secondary | ICD-10-CM | POA: Diagnosis present

## 2022-02-02 DIAGNOSIS — J90 Pleural effusion, not elsewhere classified: Secondary | ICD-10-CM | POA: Diagnosis not present

## 2022-02-02 DIAGNOSIS — I700 Atherosclerosis of aorta: Secondary | ICD-10-CM | POA: Diagnosis present

## 2022-02-02 DIAGNOSIS — E1165 Type 2 diabetes mellitus with hyperglycemia: Secondary | ICD-10-CM | POA: Diagnosis present

## 2022-02-02 DIAGNOSIS — E7800 Pure hypercholesterolemia, unspecified: Secondary | ICD-10-CM | POA: Diagnosis present

## 2022-02-02 DIAGNOSIS — E440 Moderate protein-calorie malnutrition: Secondary | ICD-10-CM | POA: Diagnosis present

## 2022-02-02 DIAGNOSIS — E119 Type 2 diabetes mellitus without complications: Secondary | ICD-10-CM | POA: Diagnosis not present

## 2022-02-02 DIAGNOSIS — Z79899 Other long term (current) drug therapy: Secondary | ICD-10-CM | POA: Insufficient documentation

## 2022-02-02 DIAGNOSIS — Z951 Presence of aortocoronary bypass graft: Secondary | ICD-10-CM | POA: Diagnosis not present

## 2022-02-02 DIAGNOSIS — I10 Essential (primary) hypertension: Secondary | ICD-10-CM | POA: Insufficient documentation

## 2022-02-02 DIAGNOSIS — Z515 Encounter for palliative care: Secondary | ICD-10-CM | POA: Diagnosis not present

## 2022-02-02 DIAGNOSIS — R079 Chest pain, unspecified: Secondary | ICD-10-CM | POA: Diagnosis not present

## 2022-02-02 DIAGNOSIS — D696 Thrombocytopenia, unspecified: Secondary | ICD-10-CM | POA: Diagnosis present

## 2022-02-02 DIAGNOSIS — I4891 Unspecified atrial fibrillation: Secondary | ICD-10-CM | POA: Diagnosis not present

## 2022-02-02 DIAGNOSIS — D6859 Other primary thrombophilia: Secondary | ICD-10-CM | POA: Diagnosis present

## 2022-02-02 DIAGNOSIS — I272 Pulmonary hypertension, unspecified: Secondary | ICD-10-CM | POA: Diagnosis present

## 2022-02-02 DIAGNOSIS — D591 Autoimmune hemolytic anemia, unspecified: Secondary | ICD-10-CM | POA: Diagnosis not present

## 2022-02-02 DIAGNOSIS — D649 Anemia, unspecified: Secondary | ICD-10-CM | POA: Diagnosis not present

## 2022-02-02 DIAGNOSIS — Z7189 Other specified counseling: Secondary | ICD-10-CM | POA: Diagnosis not present

## 2022-02-02 DIAGNOSIS — D5919 Other autoimmune hemolytic anemia: Secondary | ICD-10-CM | POA: Diagnosis present

## 2022-02-02 DIAGNOSIS — D631 Anemia in chronic kidney disease: Secondary | ICD-10-CM | POA: Diagnosis present

## 2022-02-02 DIAGNOSIS — R069 Unspecified abnormalities of breathing: Secondary | ICD-10-CM | POA: Diagnosis not present

## 2022-02-02 DIAGNOSIS — I776 Arteritis, unspecified: Secondary | ICD-10-CM | POA: Diagnosis not present

## 2022-02-02 DIAGNOSIS — K5900 Constipation, unspecified: Secondary | ICD-10-CM | POA: Diagnosis present

## 2022-02-02 DIAGNOSIS — E1151 Type 2 diabetes mellitus with diabetic peripheral angiopathy without gangrene: Secondary | ICD-10-CM | POA: Diagnosis present

## 2022-02-02 DIAGNOSIS — Z7901 Long term (current) use of anticoagulants: Secondary | ICD-10-CM

## 2022-02-02 DIAGNOSIS — R110 Nausea: Secondary | ICD-10-CM | POA: Insufficient documentation

## 2022-02-02 DIAGNOSIS — N179 Acute kidney failure, unspecified: Secondary | ICD-10-CM | POA: Diagnosis not present

## 2022-02-02 DIAGNOSIS — N1832 Chronic kidney disease, stage 3b: Secondary | ICD-10-CM | POA: Diagnosis not present

## 2022-02-02 DIAGNOSIS — R609 Edema, unspecified: Secondary | ICD-10-CM

## 2022-02-02 DIAGNOSIS — I130 Hypertensive heart and chronic kidney disease with heart failure and stage 1 through stage 4 chronic kidney disease, or unspecified chronic kidney disease: Secondary | ICD-10-CM | POA: Diagnosis present

## 2022-02-02 DIAGNOSIS — N189 Chronic kidney disease, unspecified: Secondary | ICD-10-CM | POA: Diagnosis not present

## 2022-02-02 DIAGNOSIS — R1900 Intra-abdominal and pelvic swelling, mass and lump, unspecified site: Secondary | ICD-10-CM | POA: Diagnosis not present

## 2022-02-02 DIAGNOSIS — Z7984 Long term (current) use of oral hypoglycemic drugs: Secondary | ICD-10-CM | POA: Insufficient documentation

## 2022-02-02 DIAGNOSIS — I480 Paroxysmal atrial fibrillation: Secondary | ICD-10-CM | POA: Diagnosis present

## 2022-02-02 DIAGNOSIS — I129 Hypertensive chronic kidney disease with stage 1 through stage 4 chronic kidney disease, or unspecified chronic kidney disease: Secondary | ICD-10-CM | POA: Diagnosis not present

## 2022-02-02 DIAGNOSIS — I959 Hypotension, unspecified: Secondary | ICD-10-CM | POA: Diagnosis not present

## 2022-02-02 DIAGNOSIS — E1122 Type 2 diabetes mellitus with diabetic chronic kidney disease: Secondary | ICD-10-CM | POA: Diagnosis present

## 2022-02-02 DIAGNOSIS — I5032 Chronic diastolic (congestive) heart failure: Secondary | ICD-10-CM | POA: Diagnosis present

## 2022-02-02 DIAGNOSIS — I2510 Atherosclerotic heart disease of native coronary artery without angina pectoris: Secondary | ICD-10-CM | POA: Diagnosis present

## 2022-02-02 LAB — COMPREHENSIVE METABOLIC PANEL
ALT: 27 U/L (ref 0–44)
AST: 30 U/L (ref 15–41)
Albumin: 3.4 g/dL — ABNORMAL LOW (ref 3.5–5.0)
Alkaline Phosphatase: 54 U/L (ref 38–126)
Anion gap: 10 (ref 5–15)
BUN: 114 mg/dL — ABNORMAL HIGH (ref 8–23)
CO2: 19 mmol/L — ABNORMAL LOW (ref 22–32)
Calcium: 8.4 mg/dL — ABNORMAL LOW (ref 8.9–10.3)
Chloride: 104 mmol/L (ref 98–111)
Creatinine, Ser: 2.58 mg/dL — ABNORMAL HIGH (ref 0.44–1.00)
GFR, Estimated: 18 mL/min — ABNORMAL LOW (ref >60)
Glucose, Bld: 137 mg/dL — ABNORMAL HIGH (ref 70–99)
Potassium: 4 mmol/L (ref 3.5–5.1)
Sodium: 133 mmol/L — ABNORMAL LOW (ref 135–145)
Total Bilirubin: 0.9 mg/dL (ref 0.3–1.2)
Total Protein: 5.8 g/dL — ABNORMAL LOW (ref 6.5–8.1)

## 2022-02-02 LAB — CBC WITH DIFFERENTIAL/PLATELET
Abs Immature Granulocytes: 0.05 10*3/uL (ref 0.00–0.07)
Basophils Absolute: 0 10*3/uL (ref 0.0–0.1)
Basophils Relative: 0 %
Eosinophils Absolute: 0 10*3/uL (ref 0.0–0.5)
Eosinophils Relative: 0 %
HCT: 26.5 % — ABNORMAL LOW (ref 36.0–46.0)
Hemoglobin: 8.8 g/dL — ABNORMAL LOW (ref 12.0–15.0)
Immature Granulocytes: 1 %
Lymphocytes Relative: 3 %
Lymphs Abs: 0.2 10*3/uL — ABNORMAL LOW (ref 0.7–4.0)
MCH: 31.2 pg (ref 26.0–34.0)
MCHC: 33.2 g/dL (ref 30.0–36.0)
MCV: 94 fL (ref 80.0–100.0)
Monocytes Absolute: 0.6 10*3/uL (ref 0.1–1.0)
Monocytes Relative: 7 %
Neutro Abs: 7.1 10*3/uL (ref 1.7–7.7)
Neutrophils Relative %: 89 %
Platelets: 134 10*3/uL — ABNORMAL LOW (ref 150–400)
RBC: 2.82 MIL/uL — ABNORMAL LOW (ref 3.87–5.11)
RDW: 18.1 % — ABNORMAL HIGH (ref 11.5–15.5)
WBC: 8 10*3/uL (ref 4.0–10.5)
nRBC: 0 % (ref 0.0–0.2)

## 2022-02-02 LAB — TROPONIN I (HIGH SENSITIVITY): Troponin I (High Sensitivity): 35 ng/L — ABNORMAL HIGH (ref <18)

## 2022-02-02 LAB — RETICULOCYTES
Immature Retic Fract: 9.6 % (ref 2.3–15.9)
RBC.: 2.82 MIL/uL — ABNORMAL LOW (ref 3.87–5.11)
Retic Count, Absolute: 109.7 10*3/uL (ref 19.0–186.0)
Retic Ct Pct: 3.9 % — ABNORMAL HIGH (ref 0.4–3.1)

## 2022-02-02 LAB — BRAIN NATRIURETIC PEPTIDE: B Natriuretic Peptide: 133.8 pg/mL — ABNORMAL HIGH (ref 0.0–100.0)

## 2022-02-02 LAB — LIPASE, BLOOD: Lipase: 43 U/L (ref 11–51)

## 2022-02-02 LAB — PROTIME-INR
INR: 3 — ABNORMAL HIGH (ref 0.8–1.2)
Prothrombin Time: 30.9 seconds — ABNORMAL HIGH (ref 11.4–15.2)

## 2022-02-02 MED ORDER — FUROSEMIDE 10 MG/ML IJ SOLN
60.0000 mg | Freq: Once | INTRAMUSCULAR | Status: AC
  Administered 2022-02-02: 60 mg via INTRAVENOUS
  Filled 2022-02-02: qty 8

## 2022-02-02 MED ORDER — ALBUTEROL SULFATE HFA 108 (90 BASE) MCG/ACT IN AERS: 2.0000 | INHALATION_SPRAY | RESPIRATORY_TRACT | Status: DC | PRN

## 2022-02-02 NOTE — ED Triage Notes (Signed)
Pt arrives w/ multiple complaints. Pt reports SHOB, fluid buildup on ankles and nausea. Pt hx of anemia  ?

## 2022-02-02 NOTE — ED Provider Notes (Signed)
?[facility] ?Provider Note ? ? ?CSN: [id number] ?Arrival date & time: 02/02/22  4765 ? ?  ? ?History ? ?Chief Complaint  ?Patient presents with  ? Shortness of Breath  ? Leg Swelling  ? Nausea  ? ? ?[name] is a 81 y.o. female. ? ?Patient with history of hypertension, diabetes high cholesterol, aortic valve replacement on Coumadin, hemolytic anemia recently who is on steroids and rituximab currently.  Was seen by hematology team several days ago.  Has had leg swelling now during this time.  Thought to be secondary to steroids.  She denies any black or bloody stools.  Starting to feel more shortness of breath, worsening leg swelling.  Has had some nausea.  Nothing has made it worse or better. ? ?The history is provided by the patient.  ?Shortness of Breath ? ?  ? ?Home Medications ?Prior to Admission medications   ?Medication Sig Start Date End Date Taking? Authorizing Provider  ?acetaminophen (TYLENOL) 325 MG tablet Take 650 mg by mouth every 6 (six) hours as needed for mild pain.     [provider]  ?alendronate (FOSAMAX) 70 MG tablet Take 70 mg by mouth once a week. saturdays 12/06/21   [provider]  ?allopurinol (ZYLOPRIM) 100 MG tablet Take 100 mg by mouth daily.    [provider]  ?ALPRAZolam Duanne Moron) 0.5 MG tablet Take 1 tablet by mouth at bedtime as needed. 07/27/21   [provider]  ?amLODipine (NORVASC) 10 MG tablet TAKE 1 TABLET EVERY DAY ?Patient taking differently: Take 10 mg by mouth daily. 05/13/21   [name], MD  ?atorvastatin (LIPITOR) 80 MG tablet Take 1 tablet (80 mg total) by mouth daily at 6 PM. 11/22/18   [name], MD  ?Blood Glucose Monitoring Suppl (TRUE METRIX METER) w/Device KIT  03/18/21   [provider]  ?Cholecalciferol (VITAMIN D3) 1000 UNITS CAPS Take 2,000 Units by mouth daily with lunch.     [provider]  ?Cholecalciferol (VITAMIN D3) 50 MCG (2000 UT) capsule 1 capsule  11/26/18   [provider]  ?cloNIDine (CATAPRES) 0.2 MG tablet TAKE 1 TABLET TWICE DAILY ?Patient taking differently: Take 0.2 mg by mouth 2 (two) times daily. 06/22/21   [name], MD  ?cloNIDine (CATAPRES) 0.2 MG tablet 1 tablet    [provider]  ?Cyanocobalamin (VITAMIN B12) 1000 MCG TBCR Take 1,000 mcg by mouth daily with lunch.    [provider]  ?estradiol (ESTRACE) 0.1 MG/GM vaginal cream Place 0.5g nightly for two weeks then twice a week after ?Patient taking differently: Place 1 Applicatorful vaginally 3 (three) times a week. Place 0.5g nightly for two weeks then twice a week after 09/19/21   Jaquita Folds, MD  ?folic acid (FOLVITE) 1 MG tablet Take 1 tablet (1 mg total) by mouth daily. 12/17/21 03/17/22  Terrilee Croak, MD  ?furosemide (LASIX) 40 MG tablet TAKE 2 TABLETS (80 MG TOTAL) IN MORNING AND 1 AND 1/2 TABLETS (60 MG TOTAL) IN EVENING. CHANGE IN DOSAGE. ?Patient taking differently: Take 80 mg by mouth daily. 08/11/21   Clegg, Amy D, NP  ?glipiZIDE (GLUCOTROL) 5 MG tablet Take 1 tablet (5 mg total) by mouth daily. 01/09/22   Benay Pike, MD  ?glucose blood (TRUE METRIX BLOOD GLUCOSE TEST) test strip Use 1-2 times daily    [provider]  ?hydrALAZINE (APRESOLINE) 100 MG tablet TAKE 1 TABLET THREE TIMES DAILY ?Patient taking differently: Take 100 mg by  mouth 3 (three) times daily. 12/16/20   [name], MD  ?lactose free nutrition (BOOST) LIQD Take 237 mLs by mouth daily. (To supplement meals)     [provider]  ?lidocaine (XYLOCAINE) 2 % solution Use as directed 15 mLs in the mouth or throat as needed for mouth pain. 01/30/22   Benay Pike, MD  ?magnesium oxide (MAG-OX) 400 (241.3 Mg) MG tablet Take 1 tablet (400 mg total) by mouth daily. 01/30/18   [name], MD  ?metoprolol succinate (TOPROL-XL) 25 MG 24 hr tablet TAKE 1/2 TABLET EVERY DAY ?Patient taking differently: Take 12.5 mg by mouth daily. 07/19/21   [name], MD  ?pantoprazole (PROTONIX) 40 MG tablet Take 40 mg by mouth 2 (two) times daily.  12/05/14   [provider]  ?predniSONE (DELTASONE) 20 MG tablet Take 1 tablet (20 mg total) by mouth daily with breakfast. 01/30/22   Benay Pike, MD  ?spironolactone (ALDACTONE) 25 MG tablet TAKE 1 TABLET EVERY DAY ?Patient taking differently: Take 25 mg by mouth daily. 05/13/21   [name], MD  ?tadalafil, PAH, (ADCIRCA) 20 MG tablet Take 2 tablets (40 mg total) by mouth daily. 12/06/21   [name], MD  ?TRUE METRIX BLOOD GLUCOSE TEST test strip SMARTSIG:Via Meter 04/08/21   [provider]  ?valACYclovir (VALTREX) 500 MG tablet Take 1 tablet (500 mg total) by mouth 2 (two) times daily. 01/02/22   Benay Pike, MD  ?vitamin C (ASCORBIC ACID) 500 MG tablet Take 500 mg by mouth daily with lunch.    [provider]  ?vitamin C (ASCORBIC ACID) 500 MG tablet 1 tablet    [provider]  ?warfarin (COUMADIN) 5 MG tablet Take 1 to 1 &1/2 tablets by mouth daily as directed by the coumadin clinic ?Patient taking differently: Take 5-7.5 mg by mouth See admin instructions. Take 1 to 1 &1/2 tablets by mouth daily as directed by the coumadin clinic. Take 53m Mon. Wed and Fri. 7.526mon all other days. 12/06/21   BeLorretta HarpMD  ?   ? ?Allergies    ?Flagyl [metronidazole], Ciprofloxacin, Coreg [carvedilol], Losartan, Sulfamethoxazole-trimethoprim, Verapamil, Zetia [ezetimibe], Zocor [simvastatin - high dose], and Gatifloxacin   ? ?Review of Systems   ?Review of Systems  ?Respiratory:  Positive for shortness of breath.   ? ?Physical Exam ?Updated Vital Signs ?BP (!) 156/60 (BP Location: Left Arm)   Pulse 79   Temp 98.1 ?F (36.7 ?C) (Oral)   Resp 16   SpO2 99%  ?Physical Exam ?Vitals and nursing note reviewed.  ?Constitutional:   ?   General: She is not in acute distress. ?   Appearance: She is well-developed. She is not ill-appearing.  ?HENT:  ?   Head: Normocephalic and  atraumatic.  ?   Mouth/Throat:  ?   Mouth: Mucous membranes are moist.  ?Eyes:  ?   Conjunctiva/sclera: Conjunctivae normal.  ?Cardiovascular:  ?   Rate and Rhythm: Normal rate and regular rhythm.  ?   Pulses: Normal pulses.  ?   Heart sounds: Normal heart sounds. No murmur heard. ?Pulmonary:  ?   Effort: Pulmonary effort is normal. No respiratory distress.  ?   Breath sounds: Normal breath sounds. No decreased breath sounds.  ?Abdominal:  ?   Palpations: Abdomen is soft.  ?   Tenderness: There is no abdominal tenderness.  ?Musculoskeletal:     ?   General: No swelling.  ?   Cervical back: Normal  range of motion and neck supple.  ?   Right lower leg: Edema present.  ?   Left lower leg: Edema present.  ?Skin: ?   General: Skin is warm and dry.  ?   Capillary Refill: Capillary refill takes less than 2 seconds.  ?Neurological:  ?   Mental Status: She is alert.  ?Psychiatric:     ?   Mood and Affect: Mood normal.  ? ? ?ED Results / Procedures / Treatments   ?Labs ?(all labs ordered are listed, but only abnormal results are displayed) ?Labs Reviewed  ?CBC WITH DIFFERENTIAL/PLATELET - Abnormal; Notable for the following components:  ?    Result Value  ? RBC 2.82 (*)   ? Hemoglobin 8.8 (*)   ? HCT 26.5 (*)   ? RDW 18.1 (*)   ? Platelets 134 (*)   ? Lymphs Abs 0.2 (*)   ? All other components within normal limits  ?RETICULOCYTES - Abnormal; Notable for the following components:  ? Retic Ct Pct 3.9 (*)   ? RBC. 2.82 (*)   ? All other components within normal limits  ?BRAIN NATRIURETIC PEPTIDE - Abnormal; Notable for the following components:  ? B Natriuretic Peptide 133.8 (*)   ? All other components within normal limits  ?PROTIME-INR - Abnormal; Notable for the following components:  ? Prothrombin Time 30.9 (*)   ? INR 3.0 (*)   ? All other components within normal limits  ?COMPREHENSIVE METABOLIC PANEL - Abnormal; Notable for the following components:  ? Sodium 133 (*)   ? CO2 19 (*)   ? Glucose, Bld 137 (*)   ? BUN 114  (*)   ? Creatinine, Ser 2.58 (*)   ? Calcium 8.4 (*)   ? Total Protein 5.8 (*)   ? Albumin 3.4 (*)   ? GFR, Estimated 18 (*)   ? All other components within normal limits  ?TROPONIN I (HIGH SENSITIVITY) - Abnormal; Notabl

## 2022-02-02 NOTE — Discharge Instructions (Addendum)
Increase your Lasix to 60 mg twice a day for the next 4 days and then go back down to your normal dose.  Your labs will be rechecked on Monday with your oncology team as we discussed. ?

## 2022-02-02 NOTE — Telephone Encounter (Signed)
This RN spoke with pt per noted visit to ER today per recommendation of on call service this AM per call at 802 am. ? ?[name] states overall discomfort of abdominal discomfort, diarrhea , chills as well as fluid retention. ? ?Note states " her face is swollen along with other areas of her body" ? ?Pt was seen in the ER and released. ? ?Per discussion of above as well as heme of 8.8 per lab done in ER transfusion cancelled for tomorrow. ? ?[name] states she stopped her iron supplement on Monday per MD advice. ? ?Plan presently is for the patient to call this RN tomorrow with update on status - she is scheduled for appointment on Monday with MD visit. ?

## 2022-02-03 ENCOUNTER — Inpatient Hospital Stay: Payer: Medicare HMO

## 2022-02-03 ENCOUNTER — Encounter: Payer: Self-pay | Admitting: Hematology and Oncology

## 2022-02-04 ENCOUNTER — Encounter (HOSPITAL_COMMUNITY): Payer: Self-pay | Admitting: Emergency Medicine

## 2022-02-04 ENCOUNTER — Emergency Department (HOSPITAL_COMMUNITY): Payer: Medicare HMO

## 2022-02-04 ENCOUNTER — Other Ambulatory Visit: Payer: Self-pay

## 2022-02-04 ENCOUNTER — Observation Stay (HOSPITAL_COMMUNITY): Payer: Medicare HMO

## 2022-02-04 ENCOUNTER — Inpatient Hospital Stay (HOSPITAL_COMMUNITY)
Admission: EM | Admit: 2022-02-04 | Discharge: 2022-02-10 | DRG: 809 | Disposition: A | Discharge status: Home Health | Payer: Medicare HMO | Attending: Family Medicine | Admitting: Family Medicine

## 2022-02-04 DIAGNOSIS — Z79899 Other long term (current) drug therapy: Secondary | ICD-10-CM

## 2022-02-04 DIAGNOSIS — I10 Essential (primary) hypertension: Secondary | ICD-10-CM | POA: Diagnosis present

## 2022-02-04 DIAGNOSIS — Z95820 Peripheral vascular angioplasty status with implants and grafts: Secondary | ICD-10-CM

## 2022-02-04 DIAGNOSIS — Z7983 Long term (current) use of bisphosphonates: Secondary | ICD-10-CM

## 2022-02-04 DIAGNOSIS — I130 Hypertensive heart and chronic kidney disease with heart failure and stage 1 through stage 4 chronic kidney disease, or unspecified chronic kidney disease: Secondary | ICD-10-CM | POA: Diagnosis present

## 2022-02-04 DIAGNOSIS — D591 Autoimmune hemolytic anemia, unspecified: Secondary | ICD-10-CM | POA: Diagnosis not present

## 2022-02-04 DIAGNOSIS — D696 Thrombocytopenia, unspecified: Secondary | ICD-10-CM | POA: Diagnosis present

## 2022-02-04 DIAGNOSIS — E1122 Type 2 diabetes mellitus with diabetic chronic kidney disease: Secondary | ICD-10-CM | POA: Diagnosis present

## 2022-02-04 DIAGNOSIS — Z8249 Family history of ischemic heart disease and other diseases of the circulatory system: Secondary | ICD-10-CM

## 2022-02-04 DIAGNOSIS — D649 Anemia, unspecified: Secondary | ICD-10-CM | POA: Diagnosis not present

## 2022-02-04 DIAGNOSIS — I4891 Unspecified atrial fibrillation: Secondary | ICD-10-CM

## 2022-02-04 DIAGNOSIS — Z6828 Body mass index (BMI) 28.0-28.9, adult: Secondary | ICD-10-CM

## 2022-02-04 DIAGNOSIS — Z7984 Long term (current) use of oral hypoglycemic drugs: Secondary | ICD-10-CM

## 2022-02-04 DIAGNOSIS — Z7952 Long term (current) use of systemic steroids: Secondary | ICD-10-CM

## 2022-02-04 DIAGNOSIS — K5900 Constipation, unspecified: Secondary | ICD-10-CM | POA: Diagnosis present

## 2022-02-04 DIAGNOSIS — I159 Secondary hypertension, unspecified: Secondary | ICD-10-CM

## 2022-02-04 DIAGNOSIS — N189 Chronic kidney disease, unspecified: Secondary | ICD-10-CM

## 2022-02-04 DIAGNOSIS — D5919 Other autoimmune hemolytic anemia: Principal | ICD-10-CM | POA: Diagnosis present

## 2022-02-04 DIAGNOSIS — D6859 Other primary thrombophilia: Secondary | ICD-10-CM | POA: Diagnosis present

## 2022-02-04 DIAGNOSIS — N170 Acute kidney failure with tubular necrosis: Secondary | ICD-10-CM

## 2022-02-04 DIAGNOSIS — Z8349 Family history of other endocrine, nutritional and metabolic diseases: Secondary | ICD-10-CM

## 2022-02-04 DIAGNOSIS — Z881 Allergy status to other antibiotic agents status: Secondary | ICD-10-CM

## 2022-02-04 DIAGNOSIS — Z9049 Acquired absence of other specified parts of digestive tract: Secondary | ICD-10-CM | POA: Diagnosis not present

## 2022-02-04 DIAGNOSIS — Z888 Allergy status to other drugs, medicaments and biological substances status: Secondary | ICD-10-CM

## 2022-02-04 DIAGNOSIS — E875 Hyperkalemia: Secondary | ICD-10-CM

## 2022-02-04 DIAGNOSIS — D464 Refractory anemia, unspecified: Secondary | ICD-10-CM | POA: Diagnosis present

## 2022-02-04 DIAGNOSIS — E119 Type 2 diabetes mellitus without complications: Secondary | ICD-10-CM

## 2022-02-04 DIAGNOSIS — D631 Anemia in chronic kidney disease: Secondary | ICD-10-CM | POA: Diagnosis present

## 2022-02-04 DIAGNOSIS — Z7901 Long term (current) use of anticoagulants: Secondary | ICD-10-CM

## 2022-02-04 DIAGNOSIS — Z882 Allergy status to sulfonamides status: Secondary | ICD-10-CM

## 2022-02-04 DIAGNOSIS — M79651 Pain in right thigh: Secondary | ICD-10-CM | POA: Diagnosis not present

## 2022-02-04 DIAGNOSIS — I700 Atherosclerosis of aorta: Secondary | ICD-10-CM | POA: Diagnosis not present

## 2022-02-04 DIAGNOSIS — I272 Pulmonary hypertension, unspecified: Secondary | ICD-10-CM | POA: Diagnosis present

## 2022-02-04 DIAGNOSIS — E118 Type 2 diabetes mellitus with unspecified complications: Secondary | ICD-10-CM

## 2022-02-04 DIAGNOSIS — Z952 Presence of prosthetic heart valve: Secondary | ICD-10-CM

## 2022-02-04 DIAGNOSIS — E669 Obesity, unspecified: Secondary | ICD-10-CM | POA: Diagnosis present

## 2022-02-04 DIAGNOSIS — E871 Hypo-osmolality and hyponatremia: Secondary | ICD-10-CM | POA: Diagnosis present

## 2022-02-04 DIAGNOSIS — E7800 Pure hypercholesterolemia, unspecified: Secondary | ICD-10-CM | POA: Diagnosis present

## 2022-02-04 DIAGNOSIS — E1151 Type 2 diabetes mellitus with diabetic peripheral angiopathy without gangrene: Secondary | ICD-10-CM | POA: Diagnosis present

## 2022-02-04 DIAGNOSIS — I5032 Chronic diastolic (congestive) heart failure: Secondary | ICD-10-CM | POA: Diagnosis present

## 2022-02-04 DIAGNOSIS — R791 Abnormal coagulation profile: Secondary | ICD-10-CM

## 2022-02-04 DIAGNOSIS — N179 Acute kidney failure, unspecified: Secondary | ICD-10-CM | POA: Diagnosis present

## 2022-02-04 DIAGNOSIS — E1165 Type 2 diabetes mellitus with hyperglycemia: Secondary | ICD-10-CM

## 2022-02-04 DIAGNOSIS — Z955 Presence of coronary angioplasty implant and graft: Secondary | ICD-10-CM

## 2022-02-04 DIAGNOSIS — N184 Chronic kidney disease, stage 4 (severe): Secondary | ICD-10-CM | POA: Diagnosis present

## 2022-02-04 DIAGNOSIS — R0602 Shortness of breath: Secondary | ICD-10-CM

## 2022-02-04 DIAGNOSIS — N952 Postmenopausal atrophic vaginitis: Secondary | ICD-10-CM

## 2022-02-04 DIAGNOSIS — Z951 Presence of aortocoronary bypass graft: Secondary | ICD-10-CM

## 2022-02-04 DIAGNOSIS — N1832 Chronic kidney disease, stage 3b: Secondary | ICD-10-CM

## 2022-02-04 DIAGNOSIS — M79652 Pain in left thigh: Secondary | ICD-10-CM | POA: Diagnosis not present

## 2022-02-04 DIAGNOSIS — Z87891 Personal history of nicotine dependence: Secondary | ICD-10-CM

## 2022-02-04 DIAGNOSIS — R040 Epistaxis: Secondary | ICD-10-CM | POA: Diagnosis present

## 2022-02-04 DIAGNOSIS — I776 Arteritis, unspecified: Secondary | ICD-10-CM

## 2022-02-04 DIAGNOSIS — I480 Paroxysmal atrial fibrillation: Secondary | ICD-10-CM | POA: Diagnosis present

## 2022-02-04 DIAGNOSIS — I2510 Atherosclerotic heart disease of native coronary artery without angina pectoris: Secondary | ICD-10-CM | POA: Diagnosis present

## 2022-02-04 DIAGNOSIS — K219 Gastro-esophageal reflux disease without esophagitis: Secondary | ICD-10-CM | POA: Diagnosis present

## 2022-02-04 DIAGNOSIS — E440 Moderate protein-calorie malnutrition: Secondary | ICD-10-CM | POA: Diagnosis present

## 2022-02-04 LAB — IRON AND TIBC
Iron: 38 ug/dL (ref 28–170)
Saturation Ratios: 13 % (ref 10.4–31.8)
TIBC: 300 ug/dL (ref 250–450)
UIBC: 262 ug/dL

## 2022-02-04 LAB — GLUCOSE, CAPILLARY
Glucose-Capillary: 177 mg/dL — ABNORMAL HIGH (ref 70–99)
Glucose-Capillary: 195 mg/dL — ABNORMAL HIGH (ref 70–99)

## 2022-02-04 LAB — CBC
HCT: 19.6 % — ABNORMAL LOW (ref 36.0–46.0)
Hemoglobin: 6.5 g/dL — CL (ref 12.0–15.0)
MCH: 31.7 pg (ref 26.0–34.0)
MCHC: 33.2 g/dL (ref 30.0–36.0)
MCV: 95.6 fL (ref 80.0–100.0)
Platelets: 118 10*3/uL — ABNORMAL LOW (ref 150–400)
RBC: 2.05 MIL/uL — ABNORMAL LOW (ref 3.87–5.11)
RDW: 18.6 % — ABNORMAL HIGH (ref 11.5–15.5)
WBC: 5.4 10*3/uL (ref 4.0–10.5)
nRBC: 0 % (ref 0.0–0.2)

## 2022-02-04 LAB — HEPATIC FUNCTION PANEL
ALT: 23 U/L (ref 0–44)
AST: 36 U/L (ref 15–41)
Albumin: 2.8 g/dL — ABNORMAL LOW (ref 3.5–5.0)
Alkaline Phosphatase: 45 U/L (ref 38–126)
Bilirubin, Direct: 0.3 mg/dL — ABNORMAL HIGH (ref 0.0–0.2)
Indirect Bilirubin: 0.2 mg/dL — ABNORMAL LOW (ref 0.3–0.9)
Total Bilirubin: 0.5 mg/dL (ref 0.3–1.2)
Total Protein: 5.3 g/dL — ABNORMAL LOW (ref 6.5–8.1)

## 2022-02-04 LAB — TSH: TSH: 1.652 u[IU]/mL (ref 0.350–4.500)

## 2022-02-04 LAB — BASIC METABOLIC PANEL
Anion gap: 10 (ref 5–15)
BUN: 130 mg/dL — ABNORMAL HIGH (ref 8–23)
CO2: 18 mmol/L — ABNORMAL LOW (ref 22–32)
Calcium: 8 mg/dL — ABNORMAL LOW (ref 8.9–10.3)
Chloride: 101 mmol/L (ref 98–111)
Creatinine, Ser: 3.1 mg/dL — ABNORMAL HIGH (ref 0.44–1.00)
GFR, Estimated: 14 mL/min — ABNORMAL LOW (ref >60)
Glucose, Bld: 429 mg/dL — ABNORMAL HIGH (ref 70–99)
Potassium: 5.3 mmol/L — ABNORMAL HIGH (ref 3.5–5.1)
Sodium: 129 mmol/L — ABNORMAL LOW (ref 135–145)

## 2022-02-04 LAB — PROTIME-INR
INR: 4.1 (ref 0.8–1.2)
Prothrombin Time: 39.6 seconds — ABNORMAL HIGH (ref 11.4–15.2)

## 2022-02-04 LAB — BRAIN NATRIURETIC PEPTIDE: B Natriuretic Peptide: 253.4 pg/mL — ABNORMAL HIGH (ref 0.0–100.0)

## 2022-02-04 LAB — FOLATE: Folate: >40 ng/mL (ref >5.9)

## 2022-02-04 LAB — TROPONIN I (HIGH SENSITIVITY): Troponin I (High Sensitivity): 34 ng/L — ABNORMAL HIGH (ref <18)

## 2022-02-04 LAB — RETICULOCYTES
Immature Retic Fract: 12.3 % (ref 2.3–15.9)
RBC.: 2.14 MIL/uL — ABNORMAL LOW (ref 3.87–5.11)
Retic Count, Absolute: 107.2 10*3/uL (ref 19.0–186.0)
Retic Ct Pct: 5 % — ABNORMAL HIGH (ref 0.4–3.1)

## 2022-02-04 LAB — CK: Total CK: 39 U/L (ref 38–234)

## 2022-02-04 LAB — VITAMIN B12: Vitamin B-12: 1584 pg/mL — ABNORMAL HIGH (ref 180–914)

## 2022-02-04 LAB — PHOSPHORUS: Phosphorus: 2.4 mg/dL — ABNORMAL LOW (ref 2.5–4.6)

## 2022-02-04 LAB — FERRITIN: Ferritin: 104 ng/mL (ref 11–307)

## 2022-02-04 LAB — PREPARE RBC (CROSSMATCH)

## 2022-02-04 LAB — MAGNESIUM: Magnesium: 1.8 mg/dL (ref 1.7–2.4)

## 2022-02-04 MED ORDER — PREDNISONE 10 MG PO TABS
10.0000 mg | ORAL_TABLET | Freq: Every day | ORAL | Status: DC
  Administered 2022-02-05 – 2022-02-06 (×2): 10 mg via ORAL
  Filled 2022-02-04 (×2): qty 1

## 2022-02-04 MED ORDER — SODIUM CHLORIDE 0.9 % IV SOLN
10.0000 mL/h | Freq: Once | INTRAVENOUS | Status: AC
  Administered 2022-02-04: 10 mL/h via INTRAVENOUS

## 2022-02-04 MED ORDER — DOCUSATE SODIUM 100 MG PO CAPS
100.0000 mg | ORAL_CAPSULE | Freq: Two times a day (BID) | ORAL | Status: DC
Start: 2022-02-04 — End: 2022-02-10
  Administered 2022-02-04 – 2022-02-09 (×7): 100 mg via ORAL
  Filled 2022-02-04 (×11): qty 1

## 2022-02-04 MED ORDER — POLYETHYLENE GLYCOL 3350 17 G PO PACK
17.0000 g | PACK | Freq: Every day | ORAL | Status: DC | PRN
  Administered 2022-02-05: 17 g via ORAL
  Filled 2022-02-04: qty 1

## 2022-02-04 MED ORDER — INSULIN GLARGINE-YFGN 100 UNIT/ML ~~LOC~~ SOLN
10.0000 [IU] | Freq: Every day | SUBCUTANEOUS | Status: DC
  Administered 2022-02-05 – 2022-02-09 (×5): 10 [IU] via SUBCUTANEOUS
  Filled 2022-02-04 (×7): qty 0.1

## 2022-02-04 MED ORDER — INSULIN ASPART 100 UNIT/ML IJ SOLN
0.0000 [IU] | INTRAMUSCULAR | Status: DC
  Administered 2022-02-05: 2 [IU] via SUBCUTANEOUS
  Administered 2022-02-05: 9 [IU] via SUBCUTANEOUS
  Administered 2022-02-05: 1 [IU] via SUBCUTANEOUS
  Administered 2022-02-06 – 2022-02-07 (×5): 3 [IU] via SUBCUTANEOUS

## 2022-02-04 MED ORDER — INSULIN ASPART 100 UNIT/ML IJ SOLN
10.0000 [IU] | Freq: Once | INTRAMUSCULAR | Status: AC
  Administered 2022-02-04: 10 [IU] via SUBCUTANEOUS

## 2022-02-04 NOTE — Assessment & Plan Note (Signed)
Patient is on Coumadin but supratherapeutic we will hold ?

## 2022-02-04 NOTE — Assessment & Plan Note (Signed)
Chronic continue Lipitor ?

## 2022-02-04 NOTE — Assessment & Plan Note (Addendum)
Order sliding scale, given hyperglycemia will start on lantus and order diabetes coordinator consult ?

## 2022-02-04 NOTE — Assessment & Plan Note (Signed)
Hyponatremia chronic recurrent close to baseline ?

## 2022-02-04 NOTE — ED Notes (Signed)
ED TO INPATIENT HANDOFF REPORT ? ?ED Nurse Name and Phone #:  ? ?S ?Name/Age/Gender ?[name] ?81 y.o. ?female ?Room/Bed: 040C/040C ? ?Code Status ?  Code Status: Prior ? ?Home/SNF/Other ?Home ?Patient oriented to: self, place, time, and situation ?Is this baseline? Yes  ? ?Triage Complete: Triage complete  ?Chief Complaint ?Symptomatic anemia [D64.9] ? ?Triage Note ?Patient BIB [facility] from home for evaluation of shortness of breath and severe lower extremity edema after starting prednisone for vasculitis. Patient speaking in complete sentences, states she has been taking '80mg'$  lasix daily as prescribed but edema is only getting worse. Patient also reports hyperglycemia, CBG with EMS 432.   ? ?Allergies ?Allergies  ?Allergen Reactions  ? Flagyl [Metronidazole] Rash  ?  ALL-OVER BODY RASH  ? Ciprofloxacin   ?  Other reaction(s): Stomach upset - tolerates ?Other reaction(s): Stomach upset - tolerates  ? Coreg [Carvedilol] Other (See Comments)  ?  Terrible cramping in the feet and had a lot of bowel movements, but not diarrhea  ? Losartan Swelling  ?  Patient doesn't recall site of swelling  ? Sulfamethoxazole-Trimethoprim   ?  Other reaction(s): stomach upset .  ? Verapamil Hives  ? Zetia [Ezetimibe] Other (See Comments)  ?  Reaction not recalled  ? Zocor [Simvastatin - High Dose] Other (See Comments)  ?  Reaction not recalled  ? Gatifloxacin Rash  ?  Redness to skin around eye  ? ? ?Level of Care/Admitting Diagnosis ?ED Disposition   ? ? ED Disposition  ?Admit  ? Condition  ?--  ? Comment  ?Hospital Area: [facility] [[id number]] ? Level of Care: Telemetry Medical [104] ? May place patient in observation at [facility] or [place] if equivalent level of care is available:: No ? Covid Evaluation: Asymptomatic - no recent exposure (last 10 days) testing not required ? Diagnosis: Symptomatic anemia [8295621] ? Admitting Physician: Toy Baker [[id number] ? Attending Physician: Toy Baker  [[id number]] ?  ?  ? ?  ? ? ?B ?Medical/Surgery History ?Past Medical History:  ?Diagnosis Date  ? Aortic atherosclerosis ([place]) 01/23/2018  ? Atrial fibrillation ([place])   ? Carotid artery disease ([place])   ? Cholelithiasis 01/23/2018  ? Chronic anticoagulation   ? Coronary artery disease   ? status post coronary artery bypass grafting times 07/10/2004  ? Diabetes mellitus   ? Hypercholesteremia   ? Hypertension   ? Mechanical heart valve present   ? H. aortic valve replacement at the time of bypass surgery October 2005  ? Moderate to severe pulmonary hypertension ([facility])   ? Peripheral arterial disease ([place])   ? history of left common iliac artery PTA and stenting for a chronic total occlusion 08/26/01  ? ?Past Surgical History:  ?Procedure Laterality Date  ? anterior repair  2009  ? AORTIC VALVE REPLACEMENT  2005  ? Dr. Cyndia Bent  ? CARDIAC CATHETERIZATION  11/10/2004  ? 40% right common illiac, 70% in stent restenosis of distal left common illiac,   ? CARDIAC CATHETERIZATION  05/18/2004  ? LAD 50-70% midstenosis, RCA dominant w/50% stenosis, 50% Right common Illiac artery ostial stenosis, 90% in stent restenosis within midportion of left common illiac stent  ? Carotid Duplex  03/12/2012  ? RSA-elev. velocities suggestive of a 50-69% diameter reduction, Right&Left Bulb/Prox ICA-mild-mod.fibrous plaqueelevating Velocities abnormal study.  ? CHOLECYSTECTOMY N/A 03/01/2018  ? Procedure: LAPAROSCOPIC CHOLECYSTECTOMY;  Surgeon: Judeth Horn, MD;  Location: [facility];  Service: General;  Laterality: N/A;  ? COLONOSCOPY WITH PROPOFOL  N/A 01/22/2018  ? Procedure: COLONOSCOPY WITH PROPOFOL;  Surgeon: Wilford Corner, MD;  Location: [facility];  Service: Endoscopy;  Laterality: N/A;  ? ESOPHAGOGASTRODUODENOSCOPY (EGD) WITH PROPOFOL N/A 01/22/2018  ? Procedure: ESOPHAGOGASTRODUODENOSCOPY (EGD) WITH PROPOFOL;  Surgeon: Wilford Corner, MD;  Location: [facility];  Service: Endoscopy;  Laterality: N/A;  ? ESOPHAGOGASTRODUODENOSCOPY (EGD) WITH  PROPOFOL N/A 11/21/2018  ? Procedure: ESOPHAGOGASTRODUODENOSCOPY (EGD) WITH PROPOFOL;  Surgeon: [name], MD;  Location: [facility];  Service: Gastroenterology;  Laterality: N/A;  ? ESOPHAGOGASTRODUODENOSCOPY (EGD) WITH PROPOFOL Left 02/27/2019  ? Procedure: ESOPHAGOGASTRODUODENOSCOPY (EGD) WITH PROPOFOL;  Surgeon: Arta Silence, MD;  [facility]: [facility] ENDOSCOPY;  Service: Endoscopy;  Laterality: Left;  ? GIVENS CAPSULE STUDY N/A 11/21/2018  ? Procedure: GIVENS CAPSULE STUDY;  Surgeon: [name], MD;  Location: [facility];  Service: Gastroenterology;  Laterality: N/A;  To be deployed during EGD  ? Lower Ext. Duplex  03/12/2012  ? Right Proximal CIA- vessel narrowing w/elevated velocities 0-49% diameter reduction. Right SFA-mild mixed density plaque throughout vessel.  ? NM MYOCAR PERF WALL MOTION  05/19/2010  ? protocol: Persantine, post stress EF 65%, negative for ischemia, low risk scan  ? RIGHT HEART CATH N/A 06/27/2018  ? Procedure: RIGHT HEART CATH;  Surgeon: [name], MD;  Location: [facility];  Service: Cardiovascular;  Laterality: N/A;  ? TOTAL ABDOMINAL HYSTERECTOMY W/ BILATERAL SALPINGOOPHORECTOMY  1989  ? TRANSTHORACIC ECHOCARDIOGRAM  08/29/2012  ? Moderately calcified annulus of mitral valve, moderate regurg. of both mitral valve and tricuspid valve.   ?  ? ?A ?IV Location/Drains/Wounds ?Patient Lines/Drains/Airways Status   ? ? Active Line/Drains/Airways   ? ? Name Placement date Placement time Site Days  ? Peripheral IV 02/04/22 20 G Right Hand 02/04/22  --  Hand  less than 1  ? ?  ?  ? ?  ? ? ?Intake/Output Last 24 hours ?No intake or output data in the 24 hours ending 02/04/22 2048 ? ?Labs/Imaging ?Results for orders placed or performed during the hospital encounter of 02/04/22 (from the past 48 hour(s))  ?Basic metabolic panel     Status: Abnormal  ? Collection Time: 02/04/22  4:49 PM  ?Result Value Ref Range  ? Sodium 129 (L) 135 - 145 mmol/L  ? Potassium 5.3 (H) 3.5 - 5.1 mmol/L   ?  Comment: NO VISIBLE HEMOLYSIS  ? Chloride 101 98 - 111 mmol/L  ? CO2 18 (L) 22 - 32 mmol/L  ? Glucose, Bld 429 (H) 70 - 99 mg/dL  ?  Comment: Glucose reference range applies only to samples taken after fasting for at least 8 hours.  ? BUN 130 (H) 8 - 23 mg/dL  ? Creatinine, Ser 3.10 (H) 0.44 - 1.00 mg/dL  ? Calcium 8.0 (L) 8.9 - 10.3 mg/dL  ? GFR, Estimated 14 (L) >60 mL/min  ?  Comment: (NOTE) ?Calculated using the CKD-EPI Creatinine Equation (2021) ?  ? Anion gap 10 5 - 15  ?  Comment: Performed at [facility], [place] [address], [place], [place] [address]  ?CBC     Status: Abnormal  ? Collection Time: 02/04/22  4:49 PM  ?Result Value Ref Range  ? WBC 5.4 4.0 - 10.5 K/uL  ? RBC 2.05 (L) 3.87 - 5.11 MIL/uL  ? Hemoglobin 6.5 (LL) 12.0 - 15.0 g/dL  ?  Comment: This critical result has verified and been called to RN ,Marlise Eves by Gardiner Fanti on 04 29 2023 at 1725, and has been read back.   ?  HCT 19.6 (L) 36.0 - 46.0 %  ? MCV 95.6 80.0 - 100.0 fL  ? MCH 31.7 26.0 - 34.0 pg  ? MCHC 33.2 30.0 - 36.0 g/dL  ? RDW 18.6 (H) 11.5 - 15.5 %  ? Platelets 118 (L) 150 - 400 K/uL  ?  Comment: Immature Platelet Fraction may be ?clinically indicated, consider ?ordering this additional test ?[id number] ?PLATELET COUNT CONFIRMED BY SMEAR ?  ? nRBC 0.0 0.0 - 0.2 %  ?  Comment: Performed at [facility], [place] [address] [place], [place] [address]  ?Protime-INR (order if Patient is taking Coumadin / Warfarin)     Status: Abnormal  ? Collection Time: 02/04/22  4:49 PM  ?Result Value Ref Range  ? Prothrombin Time 39.6 (H) 11.4 - 15.2 seconds  ? INR 4.1 (HH) 0.8 - 1.2  ?  Comment: REPEATED TO VERIFY ?CRITICAL RESULT CALLED TO, READ BACK BY AND VERIFIED WITH: ?SPECIMEN CHECKED FOR CLOTS ?C. ROWE RN [id number] 02/04/22 LIN AUNG ?(NOTE) ?INR goal varies based on device and disease states. ?Performed at [facility], [place] [address], [place], [place] ?[address] ?  ?Brain natriuretic peptide     Status: Abnormal  ? Collection Time:  02/04/22  5:30 PM  ?Result Value Ref Range  ? B Natriuretic Peptide 253.4 (H) 0.0 - 100.0 pg/mL  ?  Comment: Performed at [facility], [place] [address], [place], [place] [address]  ?Reticulocytes

## 2022-02-04 NOTE — Assessment & Plan Note (Signed)
Oncology consult  ?transfused 2 units ? follow-up CBC ? Hemoccult stool  ?anemia panel ?

## 2022-02-04 NOTE — Assessment & Plan Note (Signed)
Stable continue clonidine and amlodipine continue Lasix ?

## 2022-02-04 NOTE — Assessment & Plan Note (Signed)
Rate controlled.  If Hemoccult positive she will need to hold anticoagulation for right now patient is supratherapeutic we will hold off on Coumadin for now ?

## 2022-02-04 NOTE — Assessment & Plan Note (Addendum)
Given worsening anemia will consult oncology ?Decreased dose of prednisone down to '10mg'$   ?

## 2022-02-04 NOTE — H&P (Signed)
[name] ZOX:[id number] DOB: [birth year] DOA: 02/04/2022   PCP: Gaspar Garbe, MD   Outpatient Specialists:   CARDS:  Dr.[name] Dr. Jearld Pies   Oncology  Dr. Rachel Moulds, MD  GI  Willis Modena, MD    Patient arrived to ER on 02/04/22 at 1641 Referred by Attending Therisa Doyne, MD   Patient coming from:    home Lives alone,       Chief Complaint:   Chief Complaint  Patient presents with   Shortness of Breath    HPI: [name] is a 81 y.o. female with medical history significant of multifactorial anemia, A-fib on Coumadin, CAD, status post CABG, status post mechanical aortic valve replacement, severe pulmonary hypertension, vasculitis, CKD, GERD, HLD HTN, PAD diabetes type 2, chronic diastolic CHF    Presented with   worsening shortness of breath Patient with history of anemia secondary to CKD, and hemolytic anemia.  Has been having more shortness of breath weakness fatigue.  Patient of note also has peripheral edema she recently was seen for that at [facility] started on Lasix and since then her fluid buildup has improved.  Patient also endorsed mild nausea. Patient denies any blood in stool no melena no black stools.  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  Hyperlipidemia - on statins lipitor Lipid Panel     Component Value Date/Time   CHOL 127 09/11/2019 1219   TRIG 180 (H) 09/11/2019 1219   HDL 31 (L) 09/11/2019 1219    CHOLHDL 4.1 09/11/2019 1219   VLDL 36 09/11/2019 1219   LDLCALC 60 09/11/2019 1219   Mechanical heart replacement in aortic position -on Coumadin since 2005  Moderate to severe pulmonary hypertension on tadalafil  PAD -on Lipitor of left common iliac artery PTA and stenting for a chronic total occlusion 08/26/01     HTN on Norvasc clonidine spironolactone hydralazine Toprol   chronic CHF diastolic - last echo April 2022 Grade 3 diastolic dysfunction preserved EF On Lasix and spironolactone   CAD  - On  , statin, beta-blocker                - followed by cardiology                - last cardiac cath  The ASCVD Risk score (Arnett DK, et al., 2019) failed to calculate for the following reasons:   The 2019 ASCVD risk score is only valid for ages 29 to 17     DM 2 -  Lab Results  Component Value Date   HGBA1C 4.6 (L) 02/24/2019     PO meds only,        A. Fib -  - CHA2DS2 vas score   7       current  on anticoagulation with  Coumadin         CKD stage IIIb- baseline Cr 2.5 Estimated Creatinine Clearance: 11.1 mL/min (A) (by C-G formula based on  SCr of 3.1 mg/dL (H)).  Lab Results  Component Value Date   CREATININE 3.10 (H) 02/04/2022   CREATININE 2.58 (H) 02/02/2022   CREATININE 2.43 (H) 01/30/2022   Chronic anemia - baseline hg Hemoglobin & Hematocrit  Recent Labs    01/30/22 0820 02/02/22 1030 02/04/22 1649  HGB 7.9* 8.8* 6.5*     While in ER:     Noted to be anemic with hemoglobin down to 6.5    CXR - Cardiac enlargement, small pleural effusions and trace interstitial edema concerning for mild CHF.   Following Medications were ordered in ER: Medications  0.9 %  sodium chloride infusion (has no administration in time range)  insulin aspart (novoLOG) injection 10 Units (10 Units Subcutaneous Given 02/04/22 1840)    _______________________________________________________ ER Provider Called:  Oncology    Dr.Sherril They Recommend admit to medicine    Will see in AM      ED Triage Vitals  Enc Vitals Group     BP 02/04/22 1649 (!) 154/46     Pulse Rate 02/04/22 1649 95     Resp 02/04/22 1649 (!) 24     Temp 02/04/22 1649 97.9 F (36.6 C)     Temp Source 02/04/22 1649 Oral     SpO2 02/04/22 1649 98 %     Weight --      Height --      Head Circumference --      Peak Flow --      Pain Score 02/04/22 1642 0     Pain Loc --      Pain Edu? --      Excl. in GC? --   TMAX(24)@     _________________________________________ Significant initial  Findings: Abnormal Labs Reviewed  BASIC METABOLIC PANEL - Abnormal; Notable for the following components:      Result Value   Sodium 129 (*)    Potassium 5.3 (*)    CO2 18 (*)    Glucose, Bld 429 (*)    BUN 130 (*)    Creatinine, Ser 3.10 (*)    Calcium 8.0 (*)    GFR, Estimated 14 (*)    All other components within normal limits  CBC - Abnormal; Notable for the following components:   RBC 2.05 (*)    Hemoglobin 6.5 (*)    HCT 19.6 (*)    RDW 18.6 (*)    Platelets 118 (*)    All other components within normal limits  PROTIME-INR - Abnormal; Notable for the following components:   Prothrombin Time 39.6 (*)    INR 4.1 (*)    All other components within normal limits  BRAIN NATRIURETIC PEPTIDE - Abnormal; Notable for the following components:   B Natriuretic Peptide 253.4 (*)    All other components within normal limits     _________________________ Troponin  ordered ECG: Ordered Personally reviewed by me showing: HR : 85 Rhythm:  Atrial fibrillation Right axis deviation Minimal ST depression, inferior leads QTC 430     The recent clinical data is shown below. Vitals:   02/04/22 1800 02/04/22 1830 02/04/22 1900 02/04/22 1915  BP: (!) 139/39 (!) 158/63 (!) 158/41   Pulse: 77 74 72 92  Resp: 18 17 (!) 24 (!) 21  Temp:      TempSrc:      SpO2: 99% 99% 99% 98%    WBC     Component Value Date/Time   WBC 5.4 02/04/2022 1649   LYMPHSABS 0.2 (L) 02/02/2022  1030    MONOABS 0.6 02/02/2022 1030   EOSABS 0.0 02/02/2022 1030   BASOSABS 0.0 02/02/2022 1030      UA  ordered   Urine analysis:    Component Value Date/Time   COLORURINE STRAW (A) 01/10/2022 1932   APPEARANCEUR CLEAR 01/10/2022 1932   LABSPEC 1.008 01/10/2022 1932   PHURINE 5.0 01/10/2022 1932   GLUCOSEU >=500 (A) 01/10/2022 1932   HGBUR NEGATIVE 01/10/2022 1932   BILIRUBINUR NEGATIVE 01/10/2022 1932   BILIRUBINUR Negative 09/07/2021 1054   KETONESUR NEGATIVE 01/10/2022 1932   PROTEINUR 100 (A) 01/10/2022 1932   UROBILINOGEN 0.2 09/07/2021 1054   UROBILINOGEN 1.0 10/06/2011 1709   NITRITE NEGATIVE 01/10/2022 1932   LEUKOCYTESUR NEGATIVE 01/10/2022 1932     _______________________________________________ Hospitalist was called for admission for   Anemia, unspecified type   Exertional shortness of breath    Supratherapeutic INR    The following Work up has been ordered so far:  Orders Placed This Encounter  Procedures   DG Chest 2 View   Basic metabolic panel   CBC   Protime-INR (order if Patient is taking Coumadin / Warfarin)   Brain natriuretic peptide   Vitamin B12   Folate   Iron and TIBC   Ferritin   Reticulocytes   CK   Hepatic function panel   Magnesium   Phosphorus   Prealbumin   TSH   Urinalysis, Complete w Microscopic Urine, Clean Catch   Osmolality, urine   Na and K (sodium & potassium), rand urine   Creatinine, urine, random   Document Height and Actual Weight   Informed Consent Details: Physician/Practitioner Attestation; Transcribe to consent form and obtain patient signature   Cardiac monitoring   Consult to hospitalist   Consult to hematology   ED EKG   Prepare RBC (crossmatch)   Type and screen [facility]   Place in observation (patient's expected length of stay will be less than 2 midnights)     OTHER Significant initial  Findings:  labs showing:    Recent Labs  Lab 01/30/22 0820 02/02/22 1029 02/04/22 1649  NA  130* 133* 129*  K 4.5 4.0 5.3*  CO2 21* 19* 18*  GLUCOSE 174* 137* 429*  BUN 105* 114* 130*  CREATININE 2.43* 2.58* 3.10*  CALCIUM 8.1* 8.4* 8.0*    Cr    Up from baseline see below Lab Results  Component Value Date   CREATININE 3.10 (H) 02/04/2022   CREATININE 2.58 (H) 02/02/2022   CREATININE 2.43 (H) 01/30/2022    Recent Labs  Lab 01/30/22 0820 02/02/22 1029  AST 29 30  ALT 27 27  ALKPHOS 50 54  BILITOT 0.5 0.9  PROT 4.9* 5.8*  ALBUMIN 3.2* 3.4*   Lab Results  Component Value Date   CALCIUM 8.0 (L) 02/04/2022   PHOS 3.9 05/31/2019          Plt: Lab Results  Component Value Date   PLT 118 (L) 02/04/2022    COVID-19 Labs  No results for input(s): DDIMER, FERRITIN, LDH, CRP in the last 72 hours.  Lab Results  Component Value Date   SARSCOV2NAA NEGATIVE 12/15/2021   SARSCOV2NAA NEGATIVE 05/26/2019   SARSCOV2NAA NEGATIVE 05/08/2019   SARSCOV2NAA NEGATIVE 04/03/2019    Recent Labs  Lab 01/30/22 0820 02/02/22 1030 02/04/22 1649  WBC 6.7 8.0 5.4  NEUTROABS 6.1 7.1  --   HGB 7.9* 8.8* 6.5*  HCT 23.3* 26.5* 19.6*  MCV 91.7 94.0 95.6  PLT 107* 134*  118*    HG/HCT Down  from baseline see below    Component Value Date/Time   HGB 6.5 (LL) 02/04/2022 1649   HGB 7.9 (L) 01/30/2022 0820   HCT 19.6 (L) 02/04/2022 1649   HCT 28.5 (L) 11/20/2018 1824   MCV 95.6 02/04/2022 1649      Recent Labs  Lab 02/02/22 1030  LIPASE 43   No results for input(s): AMMONIA in the last 168 hours.    Cardiac Panel (last 3 results) No results for input(s): CKTOTAL, CKMB, TROPONINI, RELINDX in the last 72 hours.  .car BNP (last 3 results) Recent Labs    02/02/22 1030 02/04/22 1730  BNP 133.8* 253.4*      DM  labs:  HbA1C: No results for input(s): HGBA1C in the last 8760 hours.     CBG (last 3)  No results for input(s): GLUCAP in the last 72 hours.        Cultures:    Component Value Date/Time   SDES  05/26/2019 2010    URINE, RANDOM Performed  at [facility], [address], [place], [place] [address]    SPECREQUEST  05/26/2019 2010    NONE Performed at [facility], [address] [place], [place] [address]    CULT >=100,000 COLONIES/mL KLEBSIELLA PNEUMONIAE (A) 05/26/2019 2010   REPTSTATUS 05/30/2019 FINAL 05/26/2019 2010     Radiological Exams on Admission: DG Chest 2 View  Result Date: 02/04/2022 CLINICAL DATA:  Chest pain EXAM: CHEST - 2 VIEW COMPARISON:  02/02/2022 FINDINGS: Cardiac enlargement. Aortic atherosclerosis. Previous median sternotomy and CABG procedure. Mild diffuse increase interstitial markings. Blunting of the costophrenic angles compatible with small pleural effusions. Calcified granulomas noted in the left base and right midlung. No airspace opacities. Curvature of the thoracic spine is convex towards the right. IMPRESSION: Cardiac enlargement, small pleural effusions and trace interstitial edema concerning for mild CHF. Electronically Signed   By: Signa Kell M.D.   On: 02/04/2022 17:21   _______________________________________________________________________________________________________ Latest  Blood pressure (!) 158/41, pulse 92, temperature 97.9 F (36.6 C), temperature source Oral, resp. rate (!) 21, SpO2 98 %.   Vitals  labs and radiology finding personally reviewed  Review of Systems:    Pertinent positives include:  fatigue, Bilateral lower extremity swelling   Constitutional:  No weight loss, night sweats, Fevers, chills, weight loss  HEENT:  No headaches, Difficulty swallowing,Tooth/dental problems,Sore throat,  No sneezing, itching, ear ache, nasal congestion, post nasal drip,  Cardio-vascular:  No chest pain, Orthopnea, PND, anasarca, dizziness, palpitations.no  GI:  No heartburn, indigestion, abdominal pain, nausea, vomiting, diarrhea, change in bowel habits, loss of appetite, melena, blood in stool, hematemesis Resp:  no shortness of breath  at rest. No dyspnea on exertion, No excess mucus, no productive cough, No non-productive cough, No coughing up of blood.No change in color of mucus.No wheezing. Skin:  no rash or lesions. No jaundice GU:  no dysuria, change in color of urine, no urgency or frequency. No straining to urinate.  No flank pain.  Musculoskeletal:  No joint pain or no joint swelling. No decreased range of motion. No back pain.  Psych:  No change in mood or affect. No depression or anxiety. No memory loss.  Neuro: no localizing neurological complaints, no tingling, no weakness, no double vision, no gait abnormality, no slurred speech, no confusion  All systems reviewed and apart from HOPI all are negative _______________________________________________________________________________________________ Past Medical History:   Past Medical History:  Diagnosis  Date   Aortic atherosclerosis (HCC) 01/23/2018   Atrial fibrillation (HCC)    Carotid artery disease (HCC)    Cholelithiasis 01/23/2018   Chronic anticoagulation    Coronary artery disease    status post coronary artery bypass grafting times 07/10/2004   Diabetes mellitus    Hypercholesteremia    Hypertension    Mechanical heart valve present    H. aortic valve replacement at the time of bypass surgery October 2005   Moderate to severe pulmonary hypertension (HCC)    Peripheral arterial disease (HCC)    history of left common iliac artery PTA and stenting for a chronic total occlusion 08/26/01      Past Surgical History:  Procedure Laterality Date   anterior repair  2009   AORTIC VALVE REPLACEMENT  2005   Dr. Laneta Simmers   CARDIAC CATHETERIZATION  11/10/2004   40% right common illiac, 70% in stent restenosis of distal left common illiac,    CARDIAC CATHETERIZATION  05/18/2004   LAD 50-70% midstenosis, RCA dominant w/50% stenosis, 50% Right common Illiac artery ostial stenosis, 90% in stent restenosis within midportion of left common illiac stent    Carotid Duplex  03/12/2012   RSA-elev. velocities suggestive of a 50-69% diameter reduction, Right&Left Bulb/Prox ICA-mild-mod.fibrous plaqueelevating Velocities abnormal study.   CHOLECYSTECTOMY N/A 03/01/2018   Procedure: LAPAROSCOPIC CHOLECYSTECTOMY;  Surgeon: Jimmye Norman, MD;  Location: [facility] OR;  Service: General;  Laterality: N/A;   COLONOSCOPY WITH PROPOFOL N/A 01/22/2018   Procedure: COLONOSCOPY WITH PROPOFOL;  Surgeon: Charlott Rakes, MD;  Location: [facility] ENDOSCOPY;  Service: Endoscopy;  Laterality: N/A;   ESOPHAGOGASTRODUODENOSCOPY (EGD) WITH PROPOFOL N/A 01/22/2018   Procedure: ESOPHAGOGASTRODUODENOSCOPY (EGD) WITH PROPOFOL;  Surgeon: Charlott Rakes, MD;  Location: [facility] ENDOSCOPY;  Service: Endoscopy;  Laterality: N/A;   ESOPHAGOGASTRODUODENOSCOPY (EGD) WITH PROPOFOL N/A 11/21/2018   Procedure: ESOPHAGOGASTRODUODENOSCOPY (EGD) WITH PROPOFOL;  Surgeon: Kerin Salen, MD;  Location: [facility] ENDOSCOPY;  Service: Gastroenterology;  Laterality: N/A;   ESOPHAGOGASTRODUODENOSCOPY (EGD) WITH PROPOFOL Left 02/27/2019   Procedure: ESOPHAGOGASTRODUODENOSCOPY (EGD) WITH PROPOFOL;  Surgeon: Willis Modena, MD;  Location: [facility] ENDOSCOPY;  Service: Endoscopy;  Laterality: Left;   GIVENS CAPSULE STUDY N/A 11/21/2018   Procedure: GIVENS CAPSULE STUDY;  Surgeon: Kerin Salen, MD;  Location: [facility] ENDOSCOPY;  Service: Gastroenterology;  Laterality: N/A;  To be deployed during EGD   Lower Ext. Duplex  03/12/2012   Right Proximal CIA- vessel narrowing w/elevated velocities 0-49% diameter reduction. Right SFA-mild mixed density plaque throughout vessel.   NM MYOCAR PERF WALL MOTION  05/19/2010   protocol: Persantine, post stress EF 65%, negative for ischemia, low risk scan   RIGHT HEART CATH N/A 06/27/2018   Procedure: RIGHT HEART CATH;  Surgeon: Laurey Morale, MD;  Location: [facility] INVASIVE CV LAB;  Service: Cardiovascular;  Laterality: N/A;   TOTAL ABDOMINAL HYSTERECTOMY W/ BILATERAL SALPINGOOPHORECTOMY  1989   TRANSTHORACIC  ECHOCARDIOGRAM  08/29/2012   Moderately calcified annulus of mitral valve, moderate regurg. of both mitral valve and tricuspid valve.     Social History:  Ambulatory  cane, walker      reports that she has quit smoking. Her smoking use included cigarettes. She has a 30.00 pack-year smoking history. She has never used smokeless tobacco. She reports that she does not drink alcohol and does not use drugs.   Family History:   Family History  Problem Relation Age of Onset   Heart disease Father    Hypertension Father    Hyperlipidemia Father    Breast cancer Neg Hx  ______________________________________________________________________________________________ Allergies: Allergies  Allergen Reactions   Flagyl [Metronidazole] Rash    ALL-OVER BODY RASH   Ciprofloxacin     Other reaction(s): Stomach upset - tolerates Other reaction(s): Stomach upset - tolerates   Coreg [Carvedilol] Other (See Comments)    Terrible cramping in the feet and had a lot of bowel movements, but not diarrhea   Losartan Swelling    Patient doesn't recall site of swelling   Sulfamethoxazole-Trimethoprim     Other reaction(s): stomach upset .   Verapamil Hives   Zetia [Ezetimibe] Other (See Comments)    Reaction not recalled   Zocor [Simvastatin - High Dose] Other (See Comments)    Reaction not recalled   Gatifloxacin Rash    Redness to skin around eye     Prior to Admission medications   Medication Sig Start Date End Date Taking? Authorizing Provider  acetaminophen (TYLENOL) 325 MG tablet Take 650 mg by mouth every 6 (six) hours as needed for mild pain.     [provider]  alendronate (FOSAMAX) 70 MG tablet Take 70 mg by mouth once a week. saturdays 12/06/21   [provider]  allopurinol (ZYLOPRIM) 100 MG tablet Take 100 mg by mouth daily.    [provider]  ALPRAZolam Prudy Feeler) 0.5 MG tablet Take 1 tablet by mouth at bedtime as needed. 07/27/21   [provider]   amLODipine (NORVASC) 10 MG tablet TAKE 1 TABLET EVERY DAY Patient taking differently: Take 10 mg by mouth daily. 05/13/21   Laurey Morale, MD  atorvastatin (LIPITOR) 80 MG tablet Take 1 tablet (80 mg total) by mouth daily at 6 PM. 11/22/18   Glade [name], MD  Blood Glucose Monitoring Suppl (TRUE METRIX METER) w/Device KIT  03/18/21   [provider]  Cholecalciferol (VITAMIN D3) 1000 UNITS CAPS Take 2,000 Units by mouth daily with lunch.     [provider]  Cholecalciferol (VITAMIN D3) 50 MCG (2000 UT) capsule 1 capsule 11/26/18   [provider]  cloNIDine (CATAPRES) 0.2 MG tablet TAKE 1 TABLET TWICE DAILY Patient taking differently: Take 0.2 mg by mouth 2 (two) times daily. 06/22/21   Laurey Morale, MD  cloNIDine (CATAPRES) 0.2 MG tablet 1 tablet    [provider]  Cyanocobalamin (VITAMIN B12) 1000 MCG TBCR Take 1,000 mcg by mouth daily with lunch.    [provider]  estradiol (ESTRACE) 0.1 MG/GM vaginal cream Place 0.5g nightly for two weeks then twice a week after Patient taking differently: Place 1 Applicatorful vaginally 3 (three) times a week. Place 0.5g nightly for two weeks then twice a week after 09/19/21   Marguerita Beards, MD  folic acid (FOLVITE) 1 MG tablet Take 1 tablet (1 mg total) by mouth daily. 12/17/21 03/17/22  Dahal, Melina Schools, MD  furosemide (LASIX) 40 MG tablet TAKE 2 TABLETS (80 MG TOTAL) IN MORNING AND 1 AND 1/2 TABLETS (60 MG TOTAL) IN EVENING. CHANGE IN DOSAGE. Patient taking differently: Take 80 mg by mouth daily. 08/11/21   Clegg, Amy D, NP  glipiZIDE (GLUCOTROL) 5 MG tablet Take 1 tablet (5 mg total) by mouth daily. 01/09/22   Rachel Moulds, MD  glucose blood (TRUE METRIX BLOOD GLUCOSE TEST) test strip Use 1-2 times daily    [provider]  hydrALAZINE (APRESOLINE) 100 MG tablet TAKE 1 TABLET THREE TIMES DAILY Patient taking differently: Take 100 mg by mouth 3 (three) times daily. 12/16/20   Laurey Morale,  MD  lactose free nutrition (  BOOST) LIQD Take 237 mLs by mouth daily. (To supplement meals)     [provider]  lidocaine (XYLOCAINE) 2 % solution Use as directed 15 mLs in the mouth or throat as needed for mouth pain. 01/30/22   Rachel Moulds, MD  magnesium oxide (MAG-OX) 400 (241.3 Mg) MG tablet Take 1 tablet (400 mg total) by mouth daily. 01/30/18   Alwyn Ren, MD  metoprolol succinate (TOPROL-XL) 25 MG 24 hr tablet TAKE 1/2 TABLET EVERY DAY Patient taking differently: Take 12.5 mg by mouth daily. 07/19/21   Laurey Morale, MD  pantoprazole (PROTONIX) 40 MG tablet Take 40 mg by mouth 2 (two) times daily.  12/05/14   [provider]  predniSONE (DELTASONE) 20 MG tablet Take 1 tablet (20 mg total) by mouth daily with breakfast. 01/30/22   Rachel Moulds, MD  spironolactone (ALDACTONE) 25 MG tablet TAKE 1 TABLET EVERY DAY Patient taking differently: Take 25 mg by mouth daily. 05/13/21   Laurey Morale, MD  tadalafil, PAH, (ADCIRCA) 20 MG tablet Take 2 tablets (40 mg total) by mouth daily. 12/06/21   Laurey Morale, MD  TRUE METRIX BLOOD GLUCOSE TEST test strip SMARTSIG:Via Meter 04/08/21   [provider]  valACYclovir (VALTREX) 500 MG tablet Take 1 tablet (500 mg total) by mouth 2 (two) times daily. 01/02/22   Rachel Moulds, MD  vitamin C (ASCORBIC ACID) 500 MG tablet Take 500 mg by mouth daily with lunch.    [provider]  vitamin C (ASCORBIC ACID) 500 MG tablet 1 tablet    [provider]  warfarin (COUMADIN) 5 MG tablet Take 1 to 1 &1/2 tablets by mouth daily as directed by the coumadin clinic Patient taking differently: Take 5-7.5 mg by mouth See admin instructions. Take 1 to 1 &1/2 tablets by mouth daily as directed by the coumadin clinic. Take 5mg  Mon. Wed and Fri. 7.5mg  on all other days. 12/06/21   Runell Gess, MD    ___________________________________________________________________________________________________ Physical  Exam:    02/04/2022    7:15 PM 02/04/2022    7:00 PM 02/04/2022    6:30 PM  Vitals with BMI  Systolic  158 158  Diastolic  41 63  Pulse 92 72 74     1. General:  in No  Acute distress    Chronically ill   -appearing 2. Psychological: Alert and   Oriented 3. Head/ENT:   Moist  Mucous Membranes                          Head Non traumatic, neck supple                            Poor Dentition 4. SKIN:  decreased Skin turgor,  Skin clean Dry and intact no rash 5. Heart: Regular rate and rhythm  Murmur, no Rub or gallop 6. Lungs:  no wheezes or crackles   7. Abdomen: Soft,  non-tender, Non distended   obese  bowel sounds present 8. Lower extremities: no clubbing, cyanosis, 4+ edema 9. Neurologically Grossly intact, moving all 4 extremities equally   10. MSK: Normal range of motion    Chart has been reviewed  ______________________________________________________________________________________________  Assessment/Plan 81 y.o. female with medical history significant of multifactorial anemia, A-fib on Coumadin, CAD, status post CABG, status post mechanical aortic valve replacement, severe pulmonary hypertension, vasculitis, CKD, GERD, HLD HTN, PAD diabetes type 2, chronic  diastolic CHF   Admitted for  Anemia, unspecified type       Present on Admission:  Symptomatic anemia  Hypercholesteremia  Hypertension  Coronary artery disease  Hyponatremia  Moderate to severe pulmonary hypertension (HCC)  CKD (chronic kidney disease), stage IV (HCC)  Autoimmune hemolytic anemia (HCC)  PAF (paroxysmal atrial fibrillation) (HCC)  GERD (gastroesophageal reflux disease)  Thrombophilia (HCC)  Hyperkalemia  Acute renal failure superimposed on stage 3b chronic kidney disease (HCC)     Symptomatic anemia Oncology consult  transfused 2 units  follow-up CBC  Hemoccult stool  anemia panel  Hypertension Stable continue clonidine and amlodipine continue Lasix  Hypercholesteremia Chronic  continue Lipitor  Mechanical heart valve present Patient is on Coumadin on hold given supratheraputic INR Restart when INR comes down if no evidence of bleeding noted   Coronary artery disease Stable continue Lipitor  Diabetes mellitus without complication (HCC) Order sliding scale, given hyperglycemia will start on lantus and order diabetes coordinator consult  Hyponatremia Hyponatremia chronic recurrent close to baseline  Moderate to severe pulmonary hypertension (HCC) Provide oxygen as needed  CKD (chronic kidney disease), stage IV (HCC)  -chronic avoid nephrotoxic medications such as NSAIDs, Vanco Zosyn combo,  avoid hypotension, continue to follow renal function   Autoimmune hemolytic anemia (HCC) Given worsening anemia will consult oncology Decreased dose of prednisone down to 10mg    PAF (paroxysmal atrial fibrillation) (HCC) Rate controlled.  If Hemoccult positive she will need to hold anticoagulation for right now patient is supratherapeutic we will hold off on Coumadin for now  GERD (gastroesophageal reflux disease) Continue PPI  Thrombophilia (HCC) Patient is on Coumadin but supratherapeutic we will hold  Hyperkalemia Hold spironolactone recheck in a.m.  Acute renal failure superimposed on stage 3b chronic kidney disease (HCC) Noted at baseline creatinine closer to 2.5 today up to 3.1 recently started on Lasix.  Still appears to be fluid overload check albumin to see if it is for spacing   Other plan as per orders.  DVT prophylaxis:  SCD      Code Status:    Code Status: Prior FULL CODE  as per patient   I had personally discussed CODE STATUS with patient     Family Communication:   Family not at  Bedside    Disposition Plan:      To home once workup is complete and patient is stable   Following barriers for discharge:                            Electrolytes corrected                               Anemia corrected                                                      Will need consultants to evaluate patient prior to discharge                       Would benefit from PT/OT eval prior to DC  Ordered  Diabetes care coordinator                     Consults called: Hematology is aware  Admission status:  ED Disposition     ED Disposition  Admit   Condition  --   Comment  Hospital Area: [facility] [[id number]]  Level of Care: Telemetry Medical [[id number]]  May place patient in observation at [facility] or [facility] if equivalent level of care is available:: No  Covid Evaluation: Asymptomatic - no recent exposure (last 10 days) testing not required  Diagnosis: Symptomatic anemia [[id number]]  Admitting Physician: Therisa Doyne [[id number]]  Attending Physician: Therisa Doyne [[id number]]           Obs        Level of care     tele  For  24H   Kathelyn Gombos 02/04/2022, 9:17 PM    [facility]     after 2 AM please page floor coverage PA If 7AM-7PM, please contact the day team taking care of the patient using [email or web address]   Patient was evaluated in the context of the global COVID-19 pandemic, which necessitated consideration that the patient might be at risk for infection with the SARS-CoV-2 virus that causes COVID-19. Institutional protocols and algorithms that pertain to the evaluation of patients at risk for COVID-19 are in a state of rapid change based on information released by regulatory bodies including the CDC and federal and state organizations. These policies and algorithms were followed during the patient's care.

## 2022-02-04 NOTE — Assessment & Plan Note (Signed)
Hold spironolactone recheck in a.m. ?

## 2022-02-04 NOTE — Assessment & Plan Note (Signed)
Stable continue Lipitor ?

## 2022-02-04 NOTE — Assessment & Plan Note (Signed)
Continue PPI ?

## 2022-02-04 NOTE — Subjective & Objective (Signed)
Patient with history of anemia secondary to CKD, and hemolytic anemia.  Has been having more shortness of breath weakness fatigue.  Patient of note also has peripheral edema she recently was seen for that at [facility] started on Lasix and since then her fluid buildup has improved.  Patient also endorsed mild nausea. ?Patient denies any blood in stool no melena no black stools.  She is on Coumadin for history of atrial fibrillation as well as aortic valve replacement ?

## 2022-02-04 NOTE — Assessment & Plan Note (Addendum)
Patient is on Coumadin on hold given supratheraputic INR ?Restart when INR comes down if no evidence of bleeding noted ? ?

## 2022-02-04 NOTE — Assessment & Plan Note (Signed)
-  chronic avoid nephrotoxic medications such as NSAIDs, Vanco Zosyn combo,  avoid hypotension, continue to follow renal function  

## 2022-02-04 NOTE — ED Provider Notes (Signed)
?[facility] ?Provider Note ? ? ?CSN: [id number] ?Arrival date & time: 02/04/22  1641 ? ?  ? ?History ? ?Chief Complaint  ?Patient presents with  ? Shortness of Breath  ? ? ? ?[name] is a 81 y.o. female. ?Patient is a 81 year old female with past medical history of atrial fibrillation on metoprolol and Coumadin, coronary artery disease, coronary bypass and aortic valve replacement, severe pulmonary hypertension, an vasculitis presenting for complaints of shortness of breath.  Patient was recently seen at [facility] on on February 02, 2022 approximately 2 days ago for lower extremity edema.  Patient was diagnosed with peripheral edema and started on Lasix 60 mg twice a day for the next 4 days.  States this morning she awoke with severe shortness of breath and chest heaviness.  Denies fevers, coughing, or any URI symptoms.  States extremity swelling has greatly improved " my legs have gone down approximately 25% since I was at [name]".  ? ?The history is provided by the patient. No language interpreter was used.  ?Shortness of Breath ?Associated symptoms: no abdominal pain, no chest pain, no cough, no ear pain, no fever, no rash, no sore throat and no vomiting   ?[name] is a 81 y.o. female. ?Patient is a 81 year old female with past medical history of atrial fibrillation on metoprolol and Eliquis, coronary artery disease, coronary bypass and aortic valve replacement, severe pulmonary hypertension, an vasculitis presenting for complaints of shortness of breath.  Patient was recently seen at [facility] on on February 02, 2022 approximately 2 days ago for lower extremity edema.  Patient was diagnosed with peripheral edema and started on Lasix 60 mg twice a day for the next 4 days.  States this morning she awoke with severe shortness of breath and chest heaviness.  Denies fevers, coughing, or any URI symptoms.  States extremity swelling has greatly  improved " my legs have gone down approximately 25% since I was at [name]".  ? ?The history is provided by the patient. No language interpreter was used.  ?Shortness of Breath ?[name] is a 81 y.o. female. ?Patient is a 81 year old female with past medical history of atrial fibrillation on metoprolol and Eliquis, coronary artery disease, coronary bypass and aortic valve replacement, severe pulmonary hypertension, an vasculitis presenting for complaints of shortness of breath.  Patient was recently seen at [facility] on on February 02, 2022 approximately 2 days ago for lower extremity edema.  Patient was diagnosed with peripheral edema and started on Lasix 60 mg twice a day for the next 4 days.  States this morning she awoke with severe shortness of breath and chest heaviness.  Denies fevers, coughing, or any URI symptoms.  States extremity swelling has greatly improved " my legs have gone down approximately 25% since I was at [name]".  ? ? ?Shortness of Breath ? ?  ? ?Home Medications ?Prior to Admission medications   ?Medication Sig Start Date End Date Taking? Authorizing Provider  ?acetaminophen (TYLENOL) 325 MG tablet Take 650 mg by mouth every 6 (six) hours as needed for mild pain.     [provider]  ?alendronate (FOSAMAX) 70 MG tablet Take 70 mg by mouth once a week. saturdays 12/06/21   [provider]  ?allopurinol (ZYLOPRIM) 100 MG tablet Take 100 mg by mouth daily.    [provider]  ?ALPRAZolam Duanne Moron) 0.5 MG tablet Take 1 tablet by mouth at  bedtime as needed. 07/27/21   [provider]  ?amLODipine (NORVASC) 10 MG tablet TAKE 1 TABLET EVERY DAY ?Patient taking differently: Take 10 mg by mouth daily. 05/13/21   [name], MD  ?atorvastatin (LIPITOR) 80 MG tablet Take 1 tablet (80 mg total) by mouth daily at 6 PM. 11/22/18   [name], MD  ?Blood Glucose Monitoring Suppl (TRUE METRIX METER) w/Device KIT  03/18/21   [provider]  ?Cholecalciferol (VITAMIN D3) 1000 UNITS CAPS Take 2,000 Units by mouth daily with lunch.     [provider]  ?Cholecalciferol (VITAMIN D3) 50 MCG (2000 UT) capsule 1 capsule 11/26/18   [provider]  ?cloNIDine (CATAPRES) 0.2 MG tablet TAKE 1 TABLET TWICE DAILY ?Patient taking differently: Take 0.2 mg by mouth 2 (two) times daily. 06/22/21   [name], MD  ?cloNIDine (CATAPRES) 0.2 MG tablet 1 tablet    [provider]  ?Cyanocobalamin (VITAMIN B12) 1000 MCG TBCR Take 1,000 mcg by mouth daily with lunch.    [provider]  ?estradiol (ESTRACE) 0.1 MG/GM vaginal cream Place 0.5g nightly for two weeks then twice a week after ?Patient taking differently: Place 1 Applicatorful vaginally 3 (three) times a week. Place 0.5g nightly for two weeks then twice a week after 09/19/21   Jaquita Folds, MD  ?folic acid (FOLVITE) 1 MG tablet Take 1 tablet (1 mg total) by mouth daily. 12/17/21 03/17/22  Terrilee Croak, MD  ?furosemide (LASIX) 40 MG tablet TAKE 2 TABLETS (80 MG TOTAL) IN MORNING AND 1 AND 1/2 TABLETS (60 MG TOTAL) IN EVENING. CHANGE IN DOSAGE. ?Patient taking differently: Take 80 mg by mouth daily. 08/11/21   Clegg, Amy D, NP  ?glipiZIDE (GLUCOTROL) 5 MG tablet Take 1 tablet (5 mg total) by mouth daily. 01/09/22   Benay Pike, MD  ?glucose blood (TRUE METRIX BLOOD GLUCOSE TEST) test strip Use 1-2 times daily    [provider]  ?hydrALAZINE (APRESOLINE) 100 MG tablet TAKE 1 TABLET THREE TIMES DAILY ?Patient taking differently: Take 100 mg by mouth 3 (three) times daily. 12/16/20   [name], MD  ?lactose free nutrition (BOOST) LIQD Take 237 mLs by mouth daily. (To supplement meals)     [provider]  ?lidocaine (XYLOCAINE) 2 % solution Use as directed 15 mLs in the mouth or throat as needed for mouth pain. 01/30/22   Benay Pike, MD  ?magnesium oxide (MAG-OX) 400 (241.3 Mg) MG tablet Take 1 tablet (400 mg total) by mouth daily. 01/30/18    [name], MD  ?metoprolol succinate (TOPROL-XL) 25 MG 24 hr tablet TAKE 1/2 TABLET EVERY DAY ?Patient taking differently: Take 12.5 mg by mouth daily. 07/19/21   [name], MD  ?pantoprazole (PROTONIX) 40 MG tablet Take 40 mg by mouth 2 (two) times daily.  12/05/14   [provider]  ?predniSONE (DELTASONE) 20 MG tablet Take 1 tablet (20 mg total) by mouth daily with breakfast. 01/30/22   Benay Pike, MD  ?spironolactone (ALDACTONE) 25 MG tablet TAKE 1 TABLET EVERY DAY ?Patient taking differently: Take 25 mg by mouth daily. 05/13/21   [name], MD  ?tadalafil, PAH, (ADCIRCA) 20 MG tablet Take 2 tablets (40 mg total) by mouth daily. 12/06/21   [name], MD  ?TRUE METRIX BLOOD GLUCOSE TEST test strip SMARTSIG:Via Meter 04/08/21   [provider]  ?valACYclovir (VALTREX) 500 MG tablet Take 1 tablet (500 mg total) by mouth 2 (two) times  daily. 01/02/22   Benay Pike, MD  ?vitamin C (ASCORBIC ACID) 500 MG tablet Take 500 mg by mouth daily with lunch.    [provider]  ?vitamin C (ASCORBIC ACID) 500 MG tablet 1 tablet    [provider]  ?warfarin (COUMADIN) 5 MG tablet Take 1 to 1 &1/2 tablets by mouth daily as directed by the coumadin clinic ?Patient taking differently: Take 5-7.5 mg by mouth See admin instructions. Take 1 to 1 &1/2 tablets by mouth daily as directed by the coumadin clinic. Take 32m Mon. Wed and Fri. 7.532mon all other days. 12/06/21   BeLorretta HarpMD  ?   ? ?Allergies    ?Flagyl [metronidazole], Ciprofloxacin, Coreg [carvedilol], Losartan, Sulfamethoxazole-trimethoprim, Verapamil, Zetia [ezetimibe], Zocor [simvastatin - high dose], and Gatifloxacin   ? ?Review of Systems   ?Review of Systems  ?Constitutional:  Negative for chills and fever.  ?HENT:  Negative for ear pain and sore throat.   ?Eyes:  Negative for pain and visual disturbance.  ?Respiratory:  Positive for shortness of breath. Negative for cough.    ?Cardiovascular:  Negative for chest pain and palpitations.  ?Gastrointestinal:  Negative for abdominal pain and vomiting.  ?Genitourinary:  Negative for dysuria and hematuria.  ?Musculoskeletal:  Negative for arthra

## 2022-02-04 NOTE — Assessment & Plan Note (Signed)
Noted at baseline creatinine closer to 2.5 today up to 3.1 recently started on Lasix.  Still appears to be fluid overload check albumin to see if it is for spacing ?

## 2022-02-04 NOTE — ED Triage Notes (Signed)
Patient BIB [facility] from home for evaluation of shortness of breath and severe lower extremity edema after starting prednisone for vasculitis. Patient speaking in complete sentences, states she has been taking '80mg'$  lasix daily as prescribed but edema is only getting worse. Patient also reports hyperglycemia, CBG with EMS 432.  ?

## 2022-02-04 NOTE — Assessment & Plan Note (Signed)
Provide oxygen as needed ?

## 2022-02-05 ENCOUNTER — Encounter (HOSPITAL_COMMUNITY): Payer: Self-pay | Admitting: Internal Medicine

## 2022-02-05 ENCOUNTER — Other Ambulatory Visit: Payer: Self-pay

## 2022-02-05 DIAGNOSIS — Z952 Presence of prosthetic heart valve: Secondary | ICD-10-CM | POA: Diagnosis not present

## 2022-02-05 DIAGNOSIS — I130 Hypertensive heart and chronic kidney disease with heart failure and stage 1 through stage 4 chronic kidney disease, or unspecified chronic kidney disease: Secondary | ICD-10-CM | POA: Diagnosis present

## 2022-02-05 DIAGNOSIS — E871 Hypo-osmolality and hyponatremia: Secondary | ICD-10-CM | POA: Diagnosis present

## 2022-02-05 DIAGNOSIS — E1165 Type 2 diabetes mellitus with hyperglycemia: Secondary | ICD-10-CM | POA: Diagnosis present

## 2022-02-05 DIAGNOSIS — D649 Anemia, unspecified: Secondary | ICD-10-CM | POA: Diagnosis present

## 2022-02-05 DIAGNOSIS — I776 Arteritis, unspecified: Secondary | ICD-10-CM | POA: Diagnosis not present

## 2022-02-05 DIAGNOSIS — I2510 Atherosclerotic heart disease of native coronary artery without angina pectoris: Secondary | ICD-10-CM | POA: Diagnosis present

## 2022-02-05 DIAGNOSIS — I700 Atherosclerosis of aorta: Secondary | ICD-10-CM | POA: Diagnosis present

## 2022-02-05 DIAGNOSIS — E669 Obesity, unspecified: Secondary | ICD-10-CM | POA: Diagnosis present

## 2022-02-05 DIAGNOSIS — E1122 Type 2 diabetes mellitus with diabetic chronic kidney disease: Secondary | ICD-10-CM | POA: Diagnosis present

## 2022-02-05 DIAGNOSIS — N184 Chronic kidney disease, stage 4 (severe): Secondary | ICD-10-CM | POA: Diagnosis present

## 2022-02-05 DIAGNOSIS — N179 Acute kidney failure, unspecified: Secondary | ICD-10-CM | POA: Diagnosis present

## 2022-02-05 DIAGNOSIS — N189 Chronic kidney disease, unspecified: Secondary | ICD-10-CM | POA: Diagnosis not present

## 2022-02-05 DIAGNOSIS — D6859 Other primary thrombophilia: Secondary | ICD-10-CM | POA: Diagnosis present

## 2022-02-05 DIAGNOSIS — Z951 Presence of aortocoronary bypass graft: Secondary | ICD-10-CM | POA: Diagnosis not present

## 2022-02-05 DIAGNOSIS — E875 Hyperkalemia: Secondary | ICD-10-CM | POA: Diagnosis present

## 2022-02-05 DIAGNOSIS — E1151 Type 2 diabetes mellitus with diabetic peripheral angiopathy without gangrene: Secondary | ICD-10-CM | POA: Diagnosis present

## 2022-02-05 DIAGNOSIS — E119 Type 2 diabetes mellitus without complications: Secondary | ICD-10-CM

## 2022-02-05 DIAGNOSIS — I5032 Chronic diastolic (congestive) heart failure: Secondary | ICD-10-CM | POA: Diagnosis present

## 2022-02-05 DIAGNOSIS — E7800 Pure hypercholesterolemia, unspecified: Secondary | ICD-10-CM | POA: Diagnosis present

## 2022-02-05 DIAGNOSIS — I272 Pulmonary hypertension, unspecified: Secondary | ICD-10-CM | POA: Diagnosis present

## 2022-02-05 DIAGNOSIS — K219 Gastro-esophageal reflux disease without esophagitis: Secondary | ICD-10-CM | POA: Diagnosis present

## 2022-02-05 DIAGNOSIS — D696 Thrombocytopenia, unspecified: Secondary | ICD-10-CM | POA: Diagnosis present

## 2022-02-05 DIAGNOSIS — K5900 Constipation, unspecified: Secondary | ICD-10-CM | POA: Diagnosis present

## 2022-02-05 DIAGNOSIS — I4891 Unspecified atrial fibrillation: Secondary | ICD-10-CM | POA: Diagnosis not present

## 2022-02-05 DIAGNOSIS — D631 Anemia in chronic kidney disease: Secondary | ICD-10-CM | POA: Diagnosis present

## 2022-02-05 DIAGNOSIS — I480 Paroxysmal atrial fibrillation: Secondary | ICD-10-CM | POA: Diagnosis present

## 2022-02-05 DIAGNOSIS — D591 Autoimmune hemolytic anemia, unspecified: Secondary | ICD-10-CM | POA: Diagnosis not present

## 2022-02-05 DIAGNOSIS — D5919 Other autoimmune hemolytic anemia: Secondary | ICD-10-CM | POA: Diagnosis present

## 2022-02-05 DIAGNOSIS — E440 Moderate protein-calorie malnutrition: Secondary | ICD-10-CM | POA: Diagnosis present

## 2022-02-05 DIAGNOSIS — I482 Chronic atrial fibrillation, unspecified: Secondary | ICD-10-CM | POA: Diagnosis not present

## 2022-02-05 LAB — URINALYSIS, COMPLETE (UACMP) WITH MICROSCOPIC
Bilirubin Urine: NEGATIVE
Glucose, UA: NEGATIVE mg/dL
Hgb urine dipstick: NEGATIVE
Ketones, ur: NEGATIVE mg/dL
Nitrite: NEGATIVE
Protein, ur: 100 mg/dL — AB
Specific Gravity, Urine: 1.011 (ref 1.005–1.030)
pH: 5 (ref 5.0–8.0)

## 2022-02-05 LAB — CBC
HCT: 23.7 % — ABNORMAL LOW (ref 36.0–46.0)
Hemoglobin: 8.1 g/dL — ABNORMAL LOW (ref 12.0–15.0)
MCH: 30.9 pg (ref 26.0–34.0)
MCHC: 34.2 g/dL (ref 30.0–36.0)
MCV: 90.5 fL (ref 80.0–100.0)
Platelets: 99 10*3/uL — ABNORMAL LOW (ref 150–400)
RBC: 2.62 MIL/uL — ABNORMAL LOW (ref 3.87–5.11)
RDW: 17.5 % — ABNORMAL HIGH (ref 11.5–15.5)
WBC: 5.8 10*3/uL (ref 4.0–10.5)
nRBC: 0 % (ref 0.0–0.2)

## 2022-02-05 LAB — DIRECT ANTIGLOBULIN TEST (NOT AT ARMC)
DAT, IgG: NEGATIVE
DAT, complement: NEGATIVE

## 2022-02-05 LAB — GLUCOSE, CAPILLARY
Glucose-Capillary: 137 mg/dL — ABNORMAL HIGH (ref 70–99)
Glucose-Capillary: 147 mg/dL — ABNORMAL HIGH (ref 70–99)
Glucose-Capillary: 168 mg/dL — ABNORMAL HIGH (ref 70–99)
Glucose-Capillary: 179 mg/dL — ABNORMAL HIGH (ref 70–99)
Glucose-Capillary: 203 mg/dL — ABNORMAL HIGH (ref 70–99)
Glucose-Capillary: 369 mg/dL — ABNORMAL HIGH (ref 70–99)

## 2022-02-05 LAB — COMPREHENSIVE METABOLIC PANEL
ALT: 21 U/L (ref 0–44)
AST: 26 U/L (ref 15–41)
Albumin: 2.5 g/dL — ABNORMAL LOW (ref 3.5–5.0)
Alkaline Phosphatase: 38 U/L (ref 38–126)
Anion gap: 7 (ref 5–15)
BUN: 130 mg/dL — ABNORMAL HIGH (ref 8–23)
CO2: 19 mmol/L — ABNORMAL LOW (ref 22–32)
Calcium: 7.9 mg/dL — ABNORMAL LOW (ref 8.9–10.3)
Chloride: 108 mmol/L (ref 98–111)
Creatinine, Ser: 3 mg/dL — ABNORMAL HIGH (ref 0.44–1.00)
GFR, Estimated: 15 mL/min — ABNORMAL LOW (ref >60)
Glucose, Bld: 156 mg/dL — ABNORMAL HIGH (ref 70–99)
Potassium: 4.8 mmol/L (ref 3.5–5.1)
Sodium: 134 mmol/L — ABNORMAL LOW (ref 135–145)
Total Bilirubin: 1.3 mg/dL — ABNORMAL HIGH (ref 0.3–1.2)
Total Protein: 4.3 g/dL — ABNORMAL LOW (ref 6.5–8.1)

## 2022-02-05 LAB — CREATININE, URINE, RANDOM: Creatinine, Urine: 42.28 mg/dL

## 2022-02-05 LAB — NA AND K (SODIUM & POTASSIUM), RAND UR
Potassium Urine: 26 mmol/L
Sodium, Ur: 37 mmol/L

## 2022-02-05 LAB — OSMOLALITY, URINE: Osmolality, Ur: 371 mOsm/kg (ref 300–900)

## 2022-02-05 LAB — PROTIME-INR
INR: 3.5 — ABNORMAL HIGH (ref 0.8–1.2)
Prothrombin Time: 35.1 seconds — ABNORMAL HIGH (ref 11.4–15.2)

## 2022-02-05 LAB — PREALBUMIN: Prealbumin: 26 mg/dL (ref 18–38)

## 2022-02-05 MED ORDER — ACETAMINOPHEN 325 MG PO TABS
650.0000 mg | ORAL_TABLET | Freq: Four times a day (QID) | ORAL | Status: DC | PRN
  Administered 2022-02-07 – 2022-02-09 (×5): 650 mg via ORAL
  Filled 2022-02-05 (×5): qty 2

## 2022-02-05 MED ORDER — ACETAMINOPHEN 650 MG RE SUPP: 650.0000 mg | Freq: Four times a day (QID) | RECTAL | Status: DC | PRN

## 2022-02-05 MED ORDER — ATORVASTATIN CALCIUM 80 MG PO TABS
80.0000 mg | ORAL_TABLET | Freq: Every day | ORAL | Status: DC
  Administered 2022-02-05 – 2022-02-09 (×5): 80 mg via ORAL
  Filled 2022-02-05 (×5): qty 1

## 2022-02-05 MED ORDER — FUROSEMIDE 40 MG PO TABS
80.0000 mg | ORAL_TABLET | Freq: Every day | ORAL | Status: DC
Start: 2022-02-05 — End: 2022-02-05
  Filled 2022-02-05: qty 2

## 2022-02-05 MED ORDER — SODIUM CHLORIDE 0.9% FLUSH: 3.0000 mL | INTRAVENOUS | Status: DC | PRN

## 2022-02-05 MED ORDER — EPOETIN ALFA 40000 UNIT/ML IJ SOLN
40000.0000 [IU] | Freq: Once | INTRAMUSCULAR | Status: AC
  Administered 2022-02-06: 40000 [IU] via SUBCUTANEOUS
  Filled 2022-02-05: qty 1

## 2022-02-05 MED ORDER — METOPROLOL SUCCINATE ER 25 MG PO TB24
12.5000 mg | ORAL_TABLET | Freq: Every day | ORAL | Status: DC
Start: 2022-02-05 — End: 2022-02-10
  Administered 2022-02-05 – 2022-02-10 (×6): 12.5 mg via ORAL
  Filled 2022-02-05 (×6): qty 1

## 2022-02-05 MED ORDER — PANTOPRAZOLE SODIUM 40 MG PO TBEC
40.0000 mg | DELAYED_RELEASE_TABLET | Freq: Two times a day (BID) | ORAL | Status: DC
  Administered 2022-02-05 – 2022-02-10 (×12): 40 mg via ORAL
  Filled 2022-02-05 (×12): qty 1

## 2022-02-05 MED ORDER — CLONIDINE HCL 0.2 MG PO TABS
0.2000 mg | ORAL_TABLET | Freq: Two times a day (BID) | ORAL | Status: DC
  Administered 2022-02-05 – 2022-02-10 (×12): 0.2 mg via ORAL
  Filled 2022-02-05 (×12): qty 1

## 2022-02-05 MED ORDER — HYDROCODONE-ACETAMINOPHEN 5-325 MG PO TABS
1.0000 | ORAL_TABLET | ORAL | Status: DC | PRN
  Administered 2022-02-06: 2 via ORAL
  Administered 2022-02-08: 1 via ORAL
  Filled 2022-02-05: qty 2
  Filled 2022-02-05: qty 1

## 2022-02-05 MED ORDER — ONDANSETRON HCL 4 MG/2ML IJ SOLN
4.0000 mg | Freq: Four times a day (QID) | INTRAMUSCULAR | Status: DC | PRN
  Administered 2022-02-05 – 2022-02-09 (×6): 4 mg via INTRAVENOUS
  Filled 2022-02-05 (×6): qty 2

## 2022-02-05 MED ORDER — FUROSEMIDE 10 MG/ML IJ SOLN
80.0000 mg | Freq: Once | INTRAMUSCULAR | Status: AC
  Administered 2022-02-05: 80 mg via INTRAVENOUS
  Filled 2022-02-05: qty 8

## 2022-02-05 MED ORDER — TADALAFIL 20 MG PO TABS
40.0000 mg | ORAL_TABLET | Freq: Every day | ORAL | Status: DC
  Administered 2022-02-05 – 2022-02-09 (×5): 40 mg via ORAL
  Filled 2022-02-05 (×5): qty 2

## 2022-02-05 MED ORDER — ALBUTEROL SULFATE (2.5 MG/3ML) 0.083% IN NEBU: 2.5000 mg | INHALATION_SOLUTION | RESPIRATORY_TRACT | Status: DC | PRN

## 2022-02-05 MED ORDER — SODIUM CHLORIDE 0.9 % IV SOLN
510.0000 mg | Freq: Once | INTRAVENOUS | Status: AC
  Administered 2022-02-05: 510 mg via INTRAVENOUS
  Filled 2022-02-05: qty 17

## 2022-02-05 MED ORDER — SODIUM CHLORIDE 0.9 % IV SOLN
250.0000 mL | INTRAVENOUS | Status: DC | PRN
  Administered 2022-02-05: 250 mL via INTRAVENOUS

## 2022-02-05 MED ORDER — AMLODIPINE BESYLATE 10 MG PO TABS
10.0000 mg | ORAL_TABLET | Freq: Every day | ORAL | Status: DC
  Administered 2022-02-05 – 2022-02-10 (×6): 10 mg via ORAL
  Filled 2022-02-05 (×6): qty 1

## 2022-02-05 MED ORDER — ALPRAZOLAM 0.5 MG PO TABS
0.5000 mg | ORAL_TABLET | Freq: Every evening | ORAL | Status: DC | PRN
  Administered 2022-02-05 – 2022-02-09 (×5): 0.5 mg via ORAL
  Filled 2022-02-05 (×5): qty 1

## 2022-02-05 MED ORDER — SODIUM CHLORIDE 0.9% FLUSH
3.0000 mL | Freq: Two times a day (BID) | INTRAVENOUS | Status: DC
  Administered 2022-02-05 – 2022-02-10 (×11): 3 mL via INTRAVENOUS

## 2022-02-05 NOTE — Progress Notes (Signed)
ANTICOAGULATION CONSULT NOTE - Initial Consult ? ?Pharmacy Consult for warfarin ? ?Indication: atrial fibrillation and Aortic mechanical valve ? ?Allergies  ?Allergen Reactions  ? Flagyl [Metronidazole] Rash  ?  ALL-OVER BODY RASH  ? Ciprofloxacin   ?  Other reaction(s): Stomach upset - tolerates ?Other reaction(s): Stomach upset - tolerates  ? Coreg [Carvedilol] Other (See Comments)  ?  Terrible cramping in the feet and had a lot of bowel movements, but not diarrhea  ? Diflucan [Fluconazole] Diarrhea  ? Losartan Swelling  ?  Patient doesn't recall site of swelling  ? Sulfamethoxazole-Trimethoprim   ?  Other reaction(s): stomach upset .  ? Verapamil Hives  ? Zetia [Ezetimibe] Other (See Comments)  ?  Reaction not recalled  ? Zocor [Simvastatin - High Dose] Other (See Comments)  ?  Reaction not recalled  ? Gatifloxacin Rash  ?  Redness to skin around eye  ? ? ?Patient Measurements: ?Height: '4\' 11"'$  (149.9 cm) ?Weight: 63.3 kg (139 lb 8.8 oz) ?IBW/kg (Calculated) : 43.2 ? ? ?Vital Signs: ?Temp: 98.3 ?F (36.8 ?C) (04/30 4431) ?Temp Source: Oral (04/30 5400) ?BP: 155/90 (04/30 0721) ?Pulse Rate: 69 (04/30 8676) ? ?Labs: ?Recent Labs  ?  02/02/22 ?1029 02/02/22 ?1030 02/02/22 ?1030 02/04/22 ?1649 02/04/22 ?2000 02/05/22 ?0741  ?HGB  --  8.8*   < > 6.5*  --  8.1*  ?HCT  --  26.5*  --  19.6*  --  23.7*  ?PLT  --  134*  --  118*  --  99*  ?LABPROT  --  30.9*  --  39.6*  --  35.1*  ?INR  --  3.0*  --  4.1*  --  3.5*  ?CREATININE 2.58*  --   --  3.10*  --  3.00*  ?CKTOTAL  --   --   --   --  39  --   ?TROPONINIHS  --  35*  --   --  34*  --   ? < > = values in this interval not displayed.  ? ? ?Estimated Creatinine Clearance: 11.5 mL/min (A) (by C-G formula based on SCr of 3 mg/dL (H)). ? ? ?Medical History: ?Past Medical History:  ?Diagnosis Date  ? Aortic atherosclerosis ([place]) 01/23/2018  ? Atrial fibrillation ([place])   ? Carotid artery disease ([place])   ? Cholelithiasis 01/23/2018  ? Chronic anticoagulation   ? Coronary artery  disease   ? status post coronary artery bypass grafting times 07/10/2004  ? Diabetes mellitus   ? Hypercholesteremia   ? Hypertension   ? Mechanical heart valve present   ? H. aortic valve replacement at the time of bypass surgery October 2005  ? Moderate to severe pulmonary hypertension (HCC)   ? Peripheral arterial disease ([place])   ? history of left common iliac artery PTA and stenting for a chronic total occlusion 08/26/01  ? ? ?Assessment: ?81 yo female admitted with multifactorial anemia who is on warfarin PTA for afib and an Aortic Mechanical valve replacement. Last seen in warfarin clinic on 4/27 with INR of 3 and dose decreased to '5mg'$  daily except 7.'5mg'$  on Sunday and Thursday. Patient INR on admission 4/29 was 4.1 and today 3.5 (goal per clinic documentation is 2-3).  ? ?Pharmacy consulted to restart warfarin when appropriate. Patient admitted for symptomatic anemia and transfused. As INR is above goal will hold warfarin today and until INR <3. Will continue to obtain daily INR.  ? ?Goal of Therapy:  ?INR 2-3 ?Monitor platelets by anticoagulation  protocol: Yes ?  ?Plan:  ?Hold warfarin today ?Daily INR ?Continue to monitor Hgb ? ?Antuane Eastridge A. Levada Dy, PharmD, BCPS, FNKF ?Clinical Pharmacist ?[place] ?Please utilize Amion for appropriate phone number to reach the unit pharmacist ([place]) ? ?02/05/2022,10:08 AM ? ? ?

## 2022-02-05 NOTE — Progress Notes (Signed)
Physical Therapy Note ? ?(Full evaluation note to follow) ? ?SATURATION QUALIFICATIONS: ? ?Patient Saturations on Room Air at Rest = 99% ? ?Patient Saturations on Room Air while Ambulating = 96-99% ? ?Roney Marion, PT  ?Acute Rehabilitation Services ?Office [phone] ? ?

## 2022-02-05 NOTE — Consult Note (Signed)
[name] is well-known to the cancer center.  She is a very charming 81 year old white female.  She has multifactorial anemia.  She is on Coumadin for atrial fibrillation.  She has mechanical aortic valve.  She had chronic renal insufficiency.  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 ? ?She has been getting Rituxan in the office. ? ?She got weak yesterday.  She tried a peel a potato and really could not.  She was brought to the emergency room.  She does not have a white cell count 5.4.  Hemoglobin 6.5.  Platelet count 118,000.  Her BUN was 130 creatinine 3.1.  Blood sugar was 429.  Sodium 139 potassium 5.3. ? ?She got 2 units of packed red blood cells.  Today, her white cell count is 5.8.  Hemoglobin 8.1.  Platelet count 99,000. ? ?She came in, her corrected reticulocyte count was probably about 2%. ? ?She had iron studies done yesterday.  Her iron saturation was only 13%.  Her ferritin was 104. ? ?Again, she is on Coumadin.  When she came in, her INR was 4.1. ? ?She denies any obvious melena or bright red blood per rectum. ? ?She had an echocardiogram done on 02/03/2022.  Her left ventricular ejection fraction was 65-70%.  Her right ventricular was normal.  She had moderately dilated left atrium and right atrium.  The aortic valve was not well visualized.  Is a mechanical valve. ? ?She has been getting weekly Rituxan.  She is supposed to get a dose tomorrow. ? ?She has had leg swelling.  She says this is better now that she had some blood. ? ?She has had no fever.  There is no issues with COVID. ? ?Her appetite has been okay.  She is not a vegetarian. ? ?Currently, I would say performance status is probably ECOG 2. ? ? ?On physical exam, her temperature is 98.3.  Pulse 69.  Blood pressure 155/90.  Her head neck exam shows no scleral icterus.  There is no oral lesions.  She has no adenopathy in the neck.  Lungs are clear bilaterally.  There may be some wheezes.  Cardiac exam irregular rate and irregular  rhythm consistent with atrial fibrillation.  She has a 2/6 systolic ejection murmur.  Abdomen is soft.  There is no splenomegaly.  There is no fluid wave.  I cannot palpate her liver.  Extremity shows 2+ edema in the legs.  Skin exam shows some scattered ecchymoses.  Neurological exam is nonfocal. ? ?[name] is a very charming 81 year old white female.  She has multifactorial anemia.  She has multiple medical problems. ? ?She currently is iron deficient.  She needs IV iron.  Without IV iron, nothing else is really going to work for her to get her hemoglobin better. ? ?I will give her IV iron today.  I would then give her a dose of Procrit tomorrow.  She says she has been getting Retacrit monthly.  I think she probably will need weekly Retacrit. ? ?It is hard to say how much of this is autoimmune.  Her corrected reticulocyte count really is not that impressive. ? ?Certainly, being on Coumadin is not going to help her. ? ?She has significant renal insufficiency.  Again, I suspect she is pregnant and need Retacrit weekly. ? ?For right now, I would just give her iron today.  I would then give her Procrit tomorrow.  Again she will need Retacrit/Procrit weekly. ? ?Is hard to say how much the Rituxan is really  helping her. ? ?I am sure that Dr. Chryl Heck will see her while she is in the hospital. ? ?I do appreciate the wonderful care that she will get from the incredible staff up on [facility]. ? ? ?Lattie Haw, MD ? ?Psalms 4:8 ?

## 2022-02-05 NOTE — Progress Notes (Signed)
? ?[facility] ?PROGRESS NOTE ? ? ?[name] FGH:[id number] DOB: 1940/12/24 DOA: 02/04/2022 ? ?PCP: [name], MD ? ?Brief History/Interval Summary: 81 y.o. female with medical history significant of multifactorial anemia, A-fib on Coumadin, CAD, status post CABG, status post mechanical aortic valve replacement, severe pulmonary hypertension, vasculitis, CKD, GERD, HLD HTN, PAD diabetes type 2, chronic diastolic CHF presented with worsening shortness of breath.  She was found to be more anemic than usual with a hemoglobin of 6.5.  She was also noted to have worsening renal function compared to her usual baseline.  She was hospitalized for further management.  Blood transfusion was ordered.  ? ?Consultants: Hematology, Dr. Marin Olp ? ?Procedures: None ? ? ? ?Subjective/Interval History: ?Patient mentions that she is feeling better after blood transfusion.  No longer short of breath this morning.  Denies any abdominal pain.  Does have swelling of her lower extremities. ? ? ? ?Assessment/Plan: ? ?Symptomatic anemia/history of autoimmune hemolytic anemia ?Followed by Dr. [name] at the cancer center. Last seen by her on April 24.  She was on weekly rituximab and changed to monthly recently.  She was also started on steroids.  The dose was decreased to 20 mg once a day.  Based on her note from April 24 plan was to initiate IVIG if anemia continues to worsen.  And then eventual plans to the immunosuppressants. ?Presented with a hemoglobin of 6.5.  She was quite symptomatic.  She was transfused 2 units of PRBC. ?Hemoglobin noted to be 8.1 posttransfusion. ?Platelet counts noted to be lower today at 99,000. ?Hematology has been consulted and we await their input.   ?Dose of prednisone was decreased to 10 mg daily due to significant pedal edema. ? ?Thrombocytopenia ?Based on review of old labs she appears to have chronic thrombocytopenia which has been mild.  Drop in platelet counts to 99,000 noted this  morning.  Continue to monitor. ? ?Acute kidney injury in the setting of chronic kidney disease stage IV/hyperkalemia ?Baseline creatinine seems to be between 2.5-2.9.  Came in with creatinine of 3.1.  BUN is noted to be significantly elevated at 130.  Likely due to recent initiation of steroids.  Avoid nephrotoxic agents.  She was prescribed furosemide recently due to lower extremity edema. ?Renal function stable this morning.  She is quite volume overloaded.  We will give her intravenous steroids to try and diurese her some.  Recheck labs tomorrow.  She is followed by Dr. [name] with [facility].  May need to involve them if her kidney function worsens. ?Potassium level noted to be normal this morning. ? ?Chronic anticoagulation ?On warfarin due to presence of mechanical heart valve.  Also has a history of paroxysmal atrial fibrillation. ?INR was noted to be supratherapeutic.  Warfarin currently on hold.  Will request pharmacy to assist. ? ?Mechanical aortic valve ?On warfarin. ? ?Paroxysmal atrial fibrillation/coronary artery disease ?Warfarin currently on hold.  Continue metoprolol.  Cardiac status seems to be stable. ? ?Essential hypertension ?Noted to be on amlodipine and clonidine. ? ?Diabetes mellitus type 2 in the setting of renal disease ?Continue glargine and SSI.  HbA1c not checked recently.  We will add to tomorrow's labs. ? ?Hyponatremia ?Likely multifactorial.  Stable this morning. ? ?Hyperlipidemia ?Continue atorvastatin. ? ?Moderate to severe pulmonary hypertension ?Noted to be on tadalafil. ? ?DVT Prophylaxis: On warfarin prior to admission.  Currently on hold due to supratherapeutic INR ?Code Status: Full code ?Family Communication: Discussed with patient ?Disposition Plan: Hopefully return  home when improved ? ?Status is: Observation ?The patient will require care spanning > 2 midnights and should be moved to inpatient because: Await hematology input.  Will need IV  diuretics. ? ? ? ? ? ?Medications: Scheduled: ? amLODipine  10 mg Oral Daily  ? atorvastatin  80 mg Oral q1800  ? cloNIDine  0.2 mg Oral BID  ? docusate sodium  100 mg Oral BID  ? furosemide  80 mg Oral Daily  ? insulin aspart  0-9 Units Subcutaneous Q4H  ? insulin glargine-yfgn  10 Units Subcutaneous QHS  ? metoprolol succinate  12.5 mg Oral Daily  ? pantoprazole  40 mg Oral BID  ? predniSONE  10 mg Oral Q breakfast  ? sodium chloride flush  3 mL Intravenous Q12H  ? tadalafil  40 mg Oral Daily  ? ?Continuous: ? sodium chloride 250 mL (02/05/22 0053)  ? ?KWI:OXBDZH chloride, acetaminophen **OR** acetaminophen, albuterol, ALPRAZolam, HYDROcodone-acetaminophen, polyethylene glycol, sodium chloride flush ? ?Antibiotics: ?Anti-infectives (From admission, onward)  ? ? None  ? ?  ? ? ?Objective: ? ?Vital Signs ? ?Vitals:  ? 02/05/22 0431 02/05/22 0445 02/05/22 2992 02/05/22 0721  ?BP: (!) 132/49 (!) 134/55 136/61 (!) 155/90  ?Pulse: 69 66 69   ?Resp: '18 18 18 18  '$ ?Temp: 98.4 ?F (36.9 ?C) 98.6 ?F (37 ?C) 98.4 ?F (36.9 ?C) 98.3 ?F (36.8 ?C)  ?TempSrc: Oral Oral Oral Oral  ?SpO2: 98% 99% 96% 98%  ?Weight:      ?Height:      ? ? ?Intake/Output Summary (Last 24 hours) at 02/05/2022 0945 ?Last data filed at 02/05/2022 0600 ?Gross per 24 hour  ?Intake 1171.7 ml  ?Output 300 ml  ?Net 871.7 ml  ? ?Filed Weights  ? 02/04/22 2248  ?Weight: 63.3 kg  ? ? ?General appearance: Awake alert.  In no distress ?Resp: Normal effort at rest.  Diminished air entry at the bases with few crackles.  No wheezing or rhonchi. ?Cardio: S1-S2 is normal regular.  No S3-S4.  No rubs murmurs or bruit ?GI: Abdomen is soft.  Nontender nondistended.  Bowel sounds are present normal.  No masses organomegaly ?Extremities: 2+ pitting edema bilateral lower extremities ?Neurologic: Alert and oriented x3.  No focal neurological deficits.  ? ? ?Lab Results: ? ?Data Reviewed: I have personally reviewed following labs and reports of the imaging studies ? ?CBC: ?Recent  Labs  ?Lab 01/30/22 ?0820 02/02/22 ?1030 02/04/22 ?1649 02/05/22 ?0741  ?WBC 6.7 8.0 5.4 5.8  ?NEUTROABS 6.1 7.1  --   --   ?HGB 7.9* 8.8* 6.5* 8.1*  ?HCT 23.3* 26.5* 19.6* 23.7*  ?MCV 91.7 94.0 95.6 90.5  ?PLT 107* 134* 118* 99*  ? ? ?Basic Metabolic Panel: ?Recent Labs  ?Lab 01/30/22 ?0820 02/02/22 ?1029 02/04/22 ?1649 02/04/22 ?2000 02/05/22 ?4268  ?NA 130* 133* 129*  --  134*  ?K 4.5 4.0 5.3*  --  4.8  ?CL 100 104 101  --  108  ?CO2 21* 19* 18*  --  19*  ?GLUCOSE 174* 137* 429*  --  156*  ?BUN 105* 114* 130*  --  130*  ?CREATININE 2.43* 2.58* 3.10*  --  3.00*  ?CALCIUM 8.1* 8.4* 8.0*  --  7.9*  ?MG  --   --   --  1.8  --   ?PHOS  --   --   --  2.4*  --   ? ? ?GFR: ?Estimated Creatinine Clearance: 11.5 mL/min (A) (by C-G formula based on SCr of 3  mg/dL (H)). ? ?Liver Function Tests: ?Recent Labs  ?Lab 01/30/22 ?0820 02/02/22 ?1029 02/04/22 ?2000 02/05/22 ?0741  ?AST 29 30 36 26  ?ALT '27 27 23 21  '$ ?ALKPHOS 50 54 45 38  ?BILITOT 0.5 0.9 0.5 1.3*  ?PROT 4.9* 5.8* 5.3* 4.3*  ?ALBUMIN 3.2* 3.4* 2.8* 2.5*  ? ? ?Recent Labs  ?Lab 02/02/22 ?1030  ?LIPASE 43  ? ? ?Coagulation Profile: ?Recent Labs  ?Lab 02/02/22 ?1030 02/04/22 ?1649 02/05/22 ?0741  ?INR 3.0* 4.1* 3.5*  ? ? ?Cardiac Enzymes: ?Recent Labs  ?Lab 02/04/22 ?2000  ?CKTOTAL 39  ? ? ? ?CBG: ?Recent Labs  ?Lab 02/04/22 ?2252 02/04/22 ?2333 02/05/22 ?5465 02/05/22 ?0354 02/05/22 ?0725  ?GLUCAP 195* 177* 168* 137* 147*  ? ? ?Thyroid Function Tests: ?Recent Labs  ?  02/04/22 ?2000  ?TSH 1.652  ? ? ?Anemia Panel: ?Recent Labs  ?  02/02/22 ?1030 02/04/22 ?2000  ?[id number]  --  1,584*  ?FOLATE  --  >40.0  ?FERRITIN  --  104  ?TIBC  --  300  ?IRON  --  38  ?RETICCTPCT 3.9* 5.0*  ? ?Radiology Studies: ?DG Chest 2 View ? ?Result Date: 02/04/2022 ?CLINICAL DATA:  Chest pain EXAM: CHEST - 2 VIEW COMPARISON:  02/02/2022 FINDINGS: Cardiac enlargement. Aortic atherosclerosis. Previous median sternotomy and CABG procedure. Mild diffuse increase interstitial markings. Blunting of  the costophrenic angles compatible with small pleural effusions. Calcified granulomas noted in the left base and right midlung. No airspace opacities. Curvature of the thoracic spine is convex towards the right. IM

## 2022-02-05 NOTE — Evaluation (Signed)
Occupational Therapy Evaluation ?Patient Details ?Name: [name] ?MRN: [id number] ?DOB: November 07, 1940 ?Today's Date: 02/05/2022 ? ? ?History of Present Illness Patient is a 81 year old female with past medical history of atrial fibrillation on metoprolol and Coumadin, coronary artery disease, coronary bypass and aortic valve replacement, severe pulmonary hypertension, an vasculitis presenting for complaints of shortness of breath.  Patient was recently seen at [facility] on on February 02, 2022 approximately 2 days ago for lower extremity edema.  Patient was diagnosed with peripheral edema and started on Lasix 60 mg twice a day for the next 4 days. Arrived to hospital 4/29 with c/o severe shortness of breath and chest heaviness. Chest x-ray indicating Cardiac enlargement, small pleural effusions and trace interstitial  edema concerning for mild CHF.  ? ?Clinical Impression ?  ?PTA, pt was living at home alone. She reports she was independent with ADL, family would assist with some IADL and she was recently using rollator secondary to edema in bilateral LE. Pt reports she was receiving HHPT. Pt currently requires minguard for functional mobility at RW level with vc for safe use of AD. She completed grooming while standing at sink level and required minA for LB dressing. Pt will continue to benefit from skilled OT services to maximize safety and independence with ADL/IADL and functional mobility. Will continue to follow acutely and progress as tolerated. Recommend d/c home with [place] and continued support from family.  ?  ?   ? ?Recommendations for follow up therapy are one component of a multi-disciplinary discharge planning process, led by the attending physician.  Recommendations may be updated based on patient status, additional functional criteria and insurance authorization.  ? ?Follow Up Recommendations ? Home health OT  ?  ?Assistance Recommended at Discharge Intermittent Supervision/Assistance  ?Patient  can return home with the following Assistance with cooking/housework;Assist for transportation ? ?  ?Functional Status Assessment ? Patient has had a recent decline in their functional status and demonstrates the ability to make significant improvements in function in a reasonable and predictable amount of time.  ?Equipment Recommendations ? None recommended by OT  ?  ?Recommendations for Other Services   ? ? ?  ?Precautions / Restrictions Precautions ?Precautions: Fall ?Restrictions ?Weight Bearing Restrictions: No  ? ?  ? ?Mobility Bed Mobility ?Overal bed mobility: Modified Independent ?  ?  ?  ?  ?  ?  ?General bed mobility comments: sit to supine with HOB elevated ?  ? ?Transfers ?Overall transfer level: Needs assistance ?Equipment used: Rolling walker (2 wheels) ?Transfers: Sit to/from Stand ?Sit to Stand: Min guard ?  ?  ?  ?  ?  ?General transfer comment: for safety, vc for safe hand placement and safe use of RW ?  ? ?  ?Balance Overall balance assessment: Needs assistance ?Sitting-balance support: No upper extremity supported ?Sitting balance-Leahy Scale: Good ?  ?  ?Standing balance support: Single extremity supported, During functional activity ?Standing balance-Leahy Scale: Poor ?Standing balance comment: reliant on at least single UE support on RW ?  ?  ?  ?  ?  ?  ?  ?  ?  ?  ?  ?   ? ?ADL either performed or assessed with clinical judgement  ? ?ADL Overall ADL's : Needs assistance/impaired ?Eating/Feeding: Set up;Sitting ?  ?Grooming: Set up;Standing ?Grooming Details (indicate cue type and reason): completed grooming while standing at sink level, educated pt on use of bag on RW to carry supplies to maximize safety ?Upper Body Bathing:  Set up;Sitting ?  ?Lower Body Bathing: Set up;Sit to/from stand ?  ?Upper Body Dressing : Set up;Sitting ?  ?Lower Body Dressing: Minimal assistance ?Lower Body Dressing Details (indicate cue type and reason): to don doff socks ?Toilet Transfer: Min guard;Rolling walker  (2 wheels) ?Toilet Transfer Details (indicate cue type and reason): short distances in room, minguard for safety and cues for safe use of RW ?Toileting- Water quality scientist and Hygiene: Min guard;Sit to/from stand ?  ?  ?  ?Functional mobility during ADLs: Min guard;Rolling walker (2 wheels) ?General ADL Comments: pt limited by pain in bilateral feet, instability, poor RW management and decreased activity toelrance  ? ? ? ?Vision Baseline Vision/History: 1 Wears glasses ?Ability to See in Adequate Light: 0 Adequate ?Patient Visual Report: No change from baseline ?   ?   ?Perception   ?  ?Praxis   ?  ? ?Pertinent Vitals/Pain Pain Assessment ?Pain Assessment: Faces ?Faces Pain Scale: Hurts little more ?Pain Location: BLE with weight bearing, reports feet are sensitive secondary to swelling ?Pain Descriptors / Indicators: Guarding, Sore ?Pain Intervention(s): Monitored during session, Limited activity within patient's tolerance  ? ? ? ?Hand Dominance Right ?  ?Extremity/Trunk Assessment Upper Extremity Assessment ?Upper Extremity Assessment: Overall WFL for tasks assessed ?  ?Lower Extremity Assessment ?Lower Extremity Assessment: Generalized weakness (BLE (below knee to feet) pitting edema) ?  ?Cervical / Trunk Assessment ?Cervical / Trunk Assessment: Kyphotic ?  ?Communication Communication ?Communication: No difficulties ?  ?Cognition Arousal/Alertness: Awake/alert ?Behavior During Therapy: [facility] for tasks assessed/performed ?Overall Cognitive Status: Within Functional Limits for tasks assessed ?  ?  ?  ?  ?  ?  ?  ?  ?  ?  ?  ?  ?  ?  ?  ?  ?  ?  ?  ?General Comments  vss ? ?  ?Exercises   ?  ?Shoulder Instructions    ? ? ?Home Living Family/patient expects to be discharged to:: Private residence ?Living Arrangements: Alone ?Available Help at Discharge: Family;Available 24 hours/day ?Type of Home: House ?Home Access: Stairs to enter ?Entrance Stairs-Number of Steps: 3 ?Entrance Stairs-Rails: Right;Left ?Home  Layout: One level ?  ?  ?Bathroom Shower/Tub: Tub/shower unit ?  ?Bathroom Toilet: Standard ?Bathroom Accessibility: Yes ?  ?Home Equipment: Rollator (4 wheels);Cane - single point;Shower seat;BSC/3in1;Grab bars - tub/shower;Wheelchair - manual ?  ?Additional Comments: recent use of Rollator due to BLE edema ?  ? ?  ?Prior Functioning/Environment Prior Level of Function : Independent/Modified Independent ?  ?  ?  ?  ?  ?  ?Mobility Comments: has been able to walk and handle her home and self care ?ADLs Comments: independent with ADL, family assists with grocery shopping, pt reports she was independent with cooking, medication and would drive occassionally ?  ? ?  ?  ?OT Problem List: Impaired balance (sitting and/or standing);Decreased activity tolerance;Decreased safety awareness;Increased edema;Pain ?  ?   ?OT Treatment/Interventions: Self-care/ADL training;Therapeutic exercise;Energy conservation;DME and/or AE instruction;Therapeutic activities;Patient/family education;Balance training  ?  ?OT Goals(Current goals can be found in the care plan section) Acute Rehab OT Goals ?Patient Stated Goal: to go home, edema reduced in feet ?OT Goal Formulation: With patient ?Time For Goal Achievement: 02/19/22 ?Potential to Achieve Goals: Good ?ADL Goals ?Pt Will Perform Grooming: with modified independence;standing ?Pt Will Perform Lower Body Dressing: with modified independence;sit to/from stand ?Pt Will Transfer to Toilet: with modified independence;ambulating ?Additional ADL Goal #1: Pt will demonstrate independence with 3 fall prevention strategies during  ADL/IADL and functional mobility.  ?OT Frequency: Min 2X/week ?  ? ?Co-evaluation   ?  ?  ?  ?  ? ?  ?AM-PAC OT "6 Clicks" Daily Activity     ?Outcome Measure Help from another person eating meals?: None ?Help from another person taking care of personal grooming?: A Little ?Help from another person toileting, which includes using toliet, bedpan, or urinal?: A  Little ?Help from another person bathing (including washing, rinsing, drying)?: A Little ?Help from another person to put on and taking off regular upper body clothing?: A Little ?Help from another person to put on

## 2022-02-05 NOTE — Evaluation (Signed)
Physical Therapy Evaluation ?Patient Details ?Name: [name] ?MRN: [id number] ?DOB: 07/05/1941 ?Today's Date: 02/05/2022 ? ?History of Present Illness ? Patient is a 81 year old female with past medical history of atrial fibrillation on metoprolol and Coumadin, coronary artery disease, coronary bypass and aortic valve replacement, severe pulmonary hypertension, an vasculitis presenting for complaints of shortness of breath.  Patient was recently seen at [facility] on on February 02, 2022 approximately 2 days ago for lower extremity edema.  Patient was diagnosed with peripheral edema and started on Lasix 60 mg twice a day for the next 4 days. Arrived to hospital 4/29 with c/o severe shortness of breath and chest heaviness. Chest x-ray indicating Cardiac enlargement, small pleural effusions and trace interstitial  edema concerning for mild CHF.  ?Clinical Impression ?  ?Pt admitted with above diagnosis. Lives at home alone, in a single-level home with a few steps to enter; Prior to admission, pt was able to manage independntly; Presents to PT with generalized weakness and decr activity tolerance;  Overall managing well in room, and minguard assist to walk in the hallway; O2 sats stable with amb on room air; Pt currently with functional limitations due to the deficits listed below (see PT Problem List). Pt will benefit from skilled PT to increase their independence and safety with mobility to allow discharge to the venue listed below.      ?   ? ?Recommendations for follow up therapy are one component of a multi-disciplinary discharge planning process, led by the attending physician.  Recommendations may be updated based on patient status, additional functional criteria and insurance authorization. ? ?Follow Up Recommendations Home health PT ? ?  ?Assistance Recommended at Discharge Intermittent Supervision/Assistance  ?Patient can return home with the following ?   ? ?  ?Equipment Recommendations Rollator (4  wheels) (May already have one?)  ?Recommendations for Other Services ?    ?  ?Functional Status Assessment Patient has had a recent decline in their functional status and demonstrates the ability to make significant improvements in function in a reasonable and predictable amount of time.  ? ?  ?Precautions / Restrictions Precautions ?Precautions: Fall  ? ?  ? ?Mobility ? Bed Mobility ?  ?  ?  ?  ?  ?  ?  ?  ?  ? ?Transfers ?Overall transfer level: Needs assistance ?Equipment used: Rolling walker (2 wheels) ?Transfers: Sit to/from Stand ?Sit to Stand: Min guard ?  ?  ?  ?  ?  ?General transfer comment: for safety, vc for safe hand placement and safe use of RW ?  ? ?Ambulation/Gait ?Ambulation/Gait assistance: Min guard (with and without physical contact) ?Gait Distance (Feet): 140 Feet ?Assistive device: Rolling walker (2 wheels) ?Gait Pattern/deviations: Step-through pattern ?  ?  ?  ?General Gait Details: Slow, but steady gait; Cues to self-monitor for activity tolerance  ? ?Stairs ?  ?  ?  ?  ?  ? ?Wheelchair Mobility ?  ? ?Modified Rankin (Stroke Patients Only) ?  ? ?  ? ?Balance   ?  ?Sitting balance-Leahy Scale: Good ?  ?  ?  ?Standing balance-Leahy Scale: Poor ?  ?  ?  ?  ?  ?  ?  ?  ?  ?  ?  ?  ?   ? ? ? ?Pertinent Vitals/Pain Pain Assessment ?Pain Assessment: No/denies pain ?Pain Intervention(s): Monitored during session  ? ? ?Home Living Family/patient expects to be discharged to:: Private residence ?Living Arrangements: Alone ?Available Help  at Discharge: Family;Available 24 hours/day ?Type of Home: House ?Home Access: Stairs to enter ?Entrance Stairs-Rails: Right;Left ?Entrance Stairs-Number of Steps: 3 ?  ?Home Layout: One level ?Home Equipment: Conservation officer, nature (2 wheels);Cane - single point;Shower seat;Crutches;Grab bars - toilet;Wheelchair - manual ?   ?  ?Prior Function Prior Level of Function : Independent/Modified Independent ?  ?  ?  ?  ?  ?  ?Mobility Comments: has been able to walk and handle her  home and self care ?ADLs Comments: independent with ADL, family assists with grocery shopping, pt reports she was independent with cooking, medication and would drive occassionally ?  ? ? ?Hand Dominance  ? Dominant Hand: Right ? ?  ?Extremity/Trunk Assessment  ? Upper Extremity Assessment ?Upper Extremity Assessment: Defer to OT evaluation ?  ? ?Lower Extremity Assessment ?Lower Extremity Assessment: Generalized weakness (BLE (below knee to feet) pitting edema) ?  ? ?Cervical / Trunk Assessment ?Cervical / Trunk Assessment: Kyphotic  ?Communication  ? Communication: No difficulties  ?Cognition Arousal/Alertness: Awake/alert ?Behavior During Therapy: [facility] for tasks assessed/performed ?Overall Cognitive Status: Within Functional Limits for tasks assessed ?  ?  ?  ?  ?  ?  ?  ?  ?  ?  ?  ?  ?  ?  ?  ?  ?  ?  ?  ? ?  ?General Comments General comments (skin integrity, edema, etc.): VSS ? ?  ?Exercises    ? ?Assessment/Plan  ?  ?PT Assessment Patient needs continued PT services  ?PT Problem List Decreased strength;Decreased activity tolerance;Decreased balance;Decreased mobility;Decreased knowledge of use of DME ? ?   ?  ?PT Treatment Interventions DME instruction;Gait training;Stair training;Functional mobility training;Therapeutic activities;Therapeutic exercise;Balance training;Neuromuscular re-education;Cognitive remediation;Patient/family education   ? ?PT Goals (Current goals can be found in the Care Plan section)  ?Acute Rehab PT Goals ?Patient Stated Goal: Says she likes to walk ?PT Goal Formulation: With patient ?Time For Goal Achievement: 02/19/22 ?Potential to Achieve Goals: Good ? ?  ?Frequency Min 3X/week ?  ? ? ?Co-evaluation   ?  ?  ?  ?  ? ? ?  ?AM-PAC PT "6 Clicks" Mobility  ?Outcome Measure Help needed turning from your back to your side while in a flat bed without using bedrails?: None ?Help needed moving from lying on your back to sitting on the side of a flat bed without using bedrails?: None ?Help  needed moving to and from a bed to a chair (including a wheelchair)?: A Little ?Help needed standing up from a chair using your arms (e.g., wheelchair or bedside chair)?: A Little ?Help needed to walk in hospital room?: A Little ?Help needed climbing 3-5 steps with a railing? : A Little ?6 Click Score: 20 ? ?  ?End of Session   ?Activity Tolerance: Patient tolerated treatment well ?Patient left: in chair;with call bell/phone within reach ?Nurse Communication: Mobility status ?PT Visit Diagnosis: Unsteadiness on feet (R26.81);Other abnormalities of gait and mobility (R26.89) ?  ? ?Time: 5573-2202 ?PT Time Calculation (min) (ACUTE ONLY): 22 min ? ? ?Charges:   PT Evaluation ?$PT Eval Low Complexity: 1 Low ?  ?  ?   ? ? ?Roney Marion, PT  ?Acute Rehabilitation Services ?Office [phone] ? ? ?[place] ?02/05/2022, 5:16 PM ? ?

## 2022-02-06 ENCOUNTER — Inpatient Hospital Stay: Payer: Medicare HMO | Admitting: Hematology and Oncology

## 2022-02-06 ENCOUNTER — Ambulatory Visit: Payer: Medicare HMO

## 2022-02-06 ENCOUNTER — Other Ambulatory Visit: Payer: Medicare HMO

## 2022-02-06 ENCOUNTER — Ambulatory Visit: Payer: Medicare HMO | Admitting: Hematology and Oncology

## 2022-02-06 ENCOUNTER — Inpatient Hospital Stay: Payer: Medicare HMO

## 2022-02-06 ENCOUNTER — Other Ambulatory Visit: Payer: Self-pay | Admitting: Hematology and Oncology

## 2022-02-06 DIAGNOSIS — N179 Acute kidney failure, unspecified: Secondary | ICD-10-CM | POA: Diagnosis not present

## 2022-02-06 DIAGNOSIS — D649 Anemia, unspecified: Secondary | ICD-10-CM | POA: Diagnosis not present

## 2022-02-06 DIAGNOSIS — D591 Autoimmune hemolytic anemia, unspecified: Secondary | ICD-10-CM

## 2022-02-06 DIAGNOSIS — I4891 Unspecified atrial fibrillation: Secondary | ICD-10-CM | POA: Diagnosis not present

## 2022-02-06 LAB — CBC
HCT: 20.8 % — ABNORMAL LOW (ref 36.0–46.0)
Hemoglobin: 7.1 g/dL — ABNORMAL LOW (ref 12.0–15.0)
MCH: 31.3 pg (ref 26.0–34.0)
MCHC: 34.1 g/dL (ref 30.0–36.0)
MCV: 91.6 fL (ref 80.0–100.0)
Platelets: 92 10*3/uL — ABNORMAL LOW (ref 150–400)
RBC: 2.27 MIL/uL — ABNORMAL LOW (ref 3.87–5.11)
RDW: 18.5 % — ABNORMAL HIGH (ref 11.5–15.5)
WBC: 4.3 10*3/uL (ref 4.0–10.5)
nRBC: 0 % (ref 0.0–0.2)

## 2022-02-06 LAB — GLUCOSE, CAPILLARY
Glucose-Capillary: 108 mg/dL — ABNORMAL HIGH (ref 70–99)
Glucose-Capillary: 136 mg/dL — ABNORMAL HIGH (ref 70–99)
Glucose-Capillary: 148 mg/dL — ABNORMAL HIGH (ref 70–99)
Glucose-Capillary: 219 mg/dL — ABNORMAL HIGH (ref 70–99)
Glucose-Capillary: 227 mg/dL — ABNORMAL HIGH (ref 70–99)
Glucose-Capillary: 281 mg/dL — ABNORMAL HIGH (ref 70–99)

## 2022-02-06 LAB — PROTIME-INR
INR: 2.8 — ABNORMAL HIGH (ref 0.8–1.2)
Prothrombin Time: 29.3 seconds — ABNORMAL HIGH (ref 11.4–15.2)

## 2022-02-06 LAB — BASIC METABOLIC PANEL
Anion gap: 5 (ref 5–15)
BUN: 138 mg/dL — ABNORMAL HIGH (ref 8–23)
CO2: 23 mmol/L (ref 22–32)
Calcium: 8.4 mg/dL — ABNORMAL LOW (ref 8.9–10.3)
Chloride: 108 mmol/L (ref 98–111)
Creatinine, Ser: 2.75 mg/dL — ABNORMAL HIGH (ref 0.44–1.00)
GFR, Estimated: 17 mL/min — ABNORMAL LOW (ref >60)
Glucose, Bld: 109 mg/dL — ABNORMAL HIGH (ref 70–99)
Potassium: 4.5 mmol/L (ref 3.5–5.1)
Sodium: 136 mmol/L (ref 135–145)

## 2022-02-06 LAB — RETICULOCYTES
Immature Retic Fract: 20.8 % — ABNORMAL HIGH (ref 2.3–15.9)
RBC.: 2.42 MIL/uL — ABNORMAL LOW (ref 3.87–5.11)
Retic Count, Absolute: 131.9 10*3/uL (ref 19.0–186.0)
Retic Ct Pct: 5.5 % — ABNORMAL HIGH (ref 0.4–3.1)

## 2022-02-06 LAB — ERYTHROPOIETIN: Erythropoietin: 39.1 m[IU]/mL — ABNORMAL HIGH (ref 2.6–18.5)

## 2022-02-06 LAB — HEMOGLOBIN A1C
Hgb A1c MFr Bld: 6.6 % — ABNORMAL HIGH (ref 4.8–5.6)
Mean Plasma Glucose: 143 mg/dL

## 2022-02-06 LAB — LACTATE DEHYDROGENASE: LDH: 203 U/L — ABNORMAL HIGH (ref 98–192)

## 2022-02-06 MED ORDER — FUROSEMIDE 10 MG/ML IJ SOLN
80.0000 mg | Freq: Once | INTRAMUSCULAR | Status: AC
  Administered 2022-02-06: 80 mg via INTRAVENOUS
  Filled 2022-02-06: qty 8

## 2022-02-06 MED ORDER — MORPHINE SULFATE (PF) 2 MG/ML IV SOLN
2.0000 mg | Freq: Once | INTRAVENOUS | Status: AC | PRN
  Administered 2022-02-06: 2 mg via INTRAVENOUS

## 2022-02-06 MED ORDER — IMMUNE GLOBULIN (HUMAN) 10 GM/100ML IV SOLN
400.0000 mg/kg | Freq: Every day | INTRAVENOUS | Status: DC
  Administered 2022-02-06: 25 g via INTRAVENOUS
  Filled 2022-02-06 (×3): qty 250

## 2022-02-06 MED ORDER — MORPHINE SULFATE (PF) 2 MG/ML IV SOLN
INTRAVENOUS | Status: AC
  Filled 2022-02-06: qty 1

## 2022-02-06 MED ORDER — PREDNISONE 20 MG PO TABS
20.0000 mg | ORAL_TABLET | Freq: Every day | ORAL | Status: DC
  Administered 2022-02-06 – 2022-02-09 (×4): 20 mg via ORAL
  Filled 2022-02-06 (×4): qty 1

## 2022-02-06 MED ORDER — ADULT MULTIVITAMIN W/MINERALS CH
1.0000 | ORAL_TABLET | Freq: Every day | ORAL | Status: DC
  Administered 2022-02-06 – 2022-02-10 (×5): 1 via ORAL
  Filled 2022-02-06 (×6): qty 1

## 2022-02-06 MED ORDER — BOOST PLUS PO LIQD
237.0000 mL | ORAL | Status: DC
  Administered 2022-02-06 – 2022-02-09 (×4): 237 mL via ORAL
  Filled 2022-02-06 (×4): qty 237

## 2022-02-06 MED ORDER — FOLIC ACID 1 MG PO TABS
1.0000 mg | ORAL_TABLET | Freq: Every day | ORAL | Status: DC
  Administered 2022-02-06 – 2022-02-10 (×5): 1 mg via ORAL
  Filled 2022-02-06 (×5): qty 1

## 2022-02-06 MED ORDER — MORPHINE SULFATE (PF) 2 MG/ML IV SOLN: 2.0000 mg | Freq: Once | INTRAVENOUS | Status: DC

## 2022-02-06 NOTE — Progress Notes (Signed)
Occupational Therapy Treatment ?Patient Details ?Name: [name] ?MRN: [id number] ?DOB: [birth year] ?Today's Date: 02/06/2022 ? ? ?History of present illness Patient is a 81 year old female with past medical history of atrial fibrillation on metoprolol and Coumadin, coronary artery disease, coronary bypass and aortic valve replacement, severe pulmonary hypertension, an vasculitis presenting for complaints of shortness of breath.  Patient was recently seen at [facility] on on February 02, 2022 approximately 2 days ago for lower extremity edema.  Patient was diagnosed with peripheral edema and started on Lasix 60 mg twice a day for the next 4 days. Arrived to hospital 4/29 with c/o severe shortness of breath and chest heaviness. Chest x-ray indicating Cardiac enlargement, small pleural effusions and trace interstitial  edema concerning for mild CHF. ?  ?OT comments ? Patient making good progress with OT treatment with patient able to walk short distance to sink for grooming and performed without seated break. Transfer to 3n1 commode with min guard for safety.  Reviewed fall prevention with patient with patient able to recall 3/3 after going to supine. Patient to continue to be followed by acute OT.   ? ?Recommendations for follow up therapy are one component of a multi-disciplinary discharge planning process, led by the attending physician.  Recommendations may be updated based on patient status, additional functional criteria and insurance authorization. ?   ?Follow Up Recommendations ? Home health OT  ?  ?Assistance Recommended at Discharge Intermittent Supervision/Assistance  ?Patient can return home with the following ? Assistance with cooking/housework;Assist for transportation ?  ?Equipment Recommendations ? None recommended by OT  ?  ?Recommendations for Other Services   ? ?  ?Precautions / Restrictions Precautions ?Precautions: Fall ?Restrictions ?Weight Bearing Restrictions: No  ? ? ?  ? ?Mobility Bed  Mobility ?Overal bed mobility: Modified Independent ?  ?  ?  ?  ?  ?  ?General bed mobility comments: seated on EOB ?  ? ?Transfers ?Overall transfer level: Needs assistance ?Equipment used: Rolling walker (2 wheels) ?Transfers: Sit to/from Stand ?Sit to Stand: Min guard ?  ?  ?  ?  ?  ?General transfer comment: transfer training for increased independence with toilet transfes ?  ?  ?Balance Overall balance assessment: Needs assistance ?Sitting-balance support: No upper extremity supported ?Sitting balance-Leahy Scale: Good ?Sitting balance - Comments: sitting on EOB upon arrival ?  ?Standing balance support: Single extremity supported, No upper extremity supported, During functional activity ?Standing balance-Leahy Scale: Poor ?Standing balance comment: able to use BUE for grooming tasks standing at sink ?  ?  ?  ?  ?  ?  ?  ?  ?  ?  ?  ?   ? ?ADL either performed or assessed with clinical judgement  ? ?ADL Overall ADL's : Needs assistance/impaired ?  ?  ?Grooming: Wash/dry hands;Wash/dry face;Oral care;Brushing hair;Set up;Standing ?Grooming Details (indicate cue type and reason): setup for standing at sink ?  ?  ?  ?  ?  ?  ?Lower Body Dressing: Minimal assistance ?Lower Body Dressing Details (indicate cue type and reason): to donn sandals ?Toilet Transfer: Min guard;Rolling walker (2 wheels) ?Toilet Transfer Details (indicate cue type and reason): to [facility] ?  ?  ?  ?  ?  ?General ADL Comments: limited by pain and fatigue ?  ? ?Extremity/Trunk Assessment   ?  ?  ?  ?  ?  ? ?Vision   ?  ?  ?Perception   ?  ?Praxis   ?  ? ?  Cognition Arousal/Alertness: Awake/alert ?Behavior During Therapy: [facility] for tasks assessed/performed ?Overall Cognitive Status: Within Functional Limits for tasks assessed ?  ?  ?  ?  ?  ?  ?  ?  ?  ?  ?  ?  ?  ?  ?  ?  ?General Comments: able to follow commands and able to recall fall preventions ?  ?  ?   ?Exercises   ? ?  ?Shoulder Instructions   ? ? ?  ?General Comments reviewed fall prevention  techniques  ? ? ?Pertinent Vitals/ Pain       Pain Assessment ?Pain Assessment: Faces ?Faces Pain Scale: Hurts a little bit ?Pain Location: BLE when standing ?Pain Descriptors / Indicators: Guarding, Sore ?Pain Intervention(s): Monitored during session ? ?Home Living   ?  ?  ?  ?  ?  ?  ?  ?  ?  ?  ?  ?  ?  ?  ?  ?  ?  ?  ? ?  ?Prior Functioning/Environment    ?  ?  ?  ?   ? ?Frequency ? Min 2X/week  ? ? ? ? ?  ?Progress Toward Goals ? ?OT Goals(current goals can now be found in the care plan section) ? Progress towards OT goals: Progressing toward goals ? ?Acute Rehab OT Goals ?Patient Stated Goal: go home ?OT Goal Formulation: With patient ?Time For Goal Achievement: 02/19/22 ?Potential to Achieve Goals: Good ?ADL Goals ?Pt Will Perform Grooming: with modified independence;standing ?Pt Will Perform Lower Body Dressing: with modified independence;sit to/from stand ?Pt Will Transfer to Toilet: with modified independence;ambulating ?Additional ADL Goal #1: Pt will demonstrate independence with 3 fall prevention strategies during ADL/IADL and functional mobility.  ?Plan Discharge plan remains appropriate   ? ?Co-evaluation ? ? ?   ?  ?  ?  ?  ? ?  ?AM-PAC OT "6 Clicks" Daily Activity     ?Outcome Measure ? ? Help from another person eating meals?: None ?Help from another person taking care of personal grooming?: A Little ?Help from another person toileting, which includes using toliet, bedpan, or urinal?: A Little ?Help from another person bathing (including washing, rinsing, drying)?: A Little ?Help from another person to put on and taking off regular upper body clothing?: A Little ?Help from another person to put on and taking off regular lower body clothing?: A Little ?6 Click Score: 19 ? ?  ?End of Session Equipment Utilized During Treatment: Gait belt;Rolling walker (2 wheels) ? ?OT Visit Diagnosis: Other abnormalities of gait and mobility (R26.89);Pain ?Pain - Right/Left: Right ?Pain - part of body: Ankle and  joints of foot;Leg ?  ?Activity Tolerance Patient tolerated treatment well ?  ?Patient Left in bed;with call bell/phone within reach;with bed alarm set;with nursing/sitter in room ?  ?Nurse Communication Mobility status ?  ? ?   ? ?Time: 0076-2263 ?OT Time Calculation (min): 31 min ? ?Charges: OT General Charges ?$OT Visit: 1 Visit ?OT Treatments ?$Self Care/Home Management : 8-22 mins ?$Therapeutic Activity: 8-22 mins ? ?Lodema Hong, OTA ?Acute Rehabilitation Services  ?Pager [phone] ?Office [phone] ? ? ?[place] ?02/06/2022, 10:22 AM ?

## 2022-02-06 NOTE — Progress Notes (Signed)
?  X-cover Note: ?Received secure chat from bedside RN. Pt having leg pain during IVIG infusion. RN reports IVIG infusion stopped. I have ordered 1 dose of morphine '2mg'$ . And directed RN to call oncology about IVIG. ? ? ?Kristopher Oppenheim, DO ?[facility] ? ?

## 2022-02-06 NOTE — Progress Notes (Signed)
ANTICOAGULATION CONSULT NOTE ? ?Pharmacy Consult for warfarin ? ?Indication: atrial fibrillation and Aortic mechanical valve ? ?Allergies  ?Allergen Reactions  ? Flagyl [Metronidazole] Rash  ?  ALL-OVER BODY RASH  ? Ciprofloxacin   ?  Other reaction(s): Stomach upset - tolerates ?Other reaction(s): Stomach upset - tolerates  ? Coreg [Carvedilol] Other (See Comments)  ?  Terrible cramping in the feet and had a lot of bowel movements, but not diarrhea  ? Diflucan [Fluconazole] Diarrhea  ? Losartan Swelling  ?  Patient doesn't recall site of swelling  ? Sulfamethoxazole-Trimethoprim   ?  Other reaction(s): stomach upset .  ? Verapamil Hives  ? Zetia [Ezetimibe] Other (See Comments)  ?  Reaction not recalled  ? Zocor [Simvastatin - High Dose] Other (See Comments)  ?  Reaction not recalled  ? Gatifloxacin Rash  ?  Redness to skin around eye  ? ? ?Patient Measurements: ?Height: '4\' 11"'$  (149.9 cm) ?Weight: 63.3 kg (139 lb 8.8 oz) ?IBW/kg (Calculated) : 43.2 ? ? ?Vital Signs: ?Temp: 97.3 ?F (36.3 ?C) (05/01 0425) ?Temp Source: Oral (05/01 0425) ?BP: 151/41 (05/01 0425) ?Pulse Rate: 74 (05/01 0425) ? ?Labs: ?Recent Labs  ?  02/04/22 ?1649 02/04/22 ?2000 02/05/22 ?0741 02/06/22 ?8416  ?HGB 6.5*  --  8.1* 7.1*  ?HCT 19.6*  --  23.7* 20.8*  ?PLT 118*  --  99* 92*  ?LABPROT 39.6*  --  35.1* 29.3*  ?INR 4.1*  --  3.5* 2.8*  ?CREATININE 3.10*  --  3.00* 2.75*  ?CKTOTAL  --  39  --   --   ?TROPONINIHS  --  34*  --   --   ? ? ? ?Estimated Creatinine Clearance: 12.5 mL/min (A) (by C-G formula based on SCr of 2.75 mg/dL (H)). ? ? ?Medical History: ?Past Medical History:  ?Diagnosis Date  ? Aortic atherosclerosis ([place]) 01/23/2018  ? Atrial fibrillation ([place])   ? Carotid artery disease ([place])   ? Cholelithiasis 01/23/2018  ? Chronic anticoagulation   ? Coronary artery disease   ? status post coronary artery bypass grafting times 07/10/2004  ? Diabetes mellitus   ? Hypercholesteremia   ? Hypertension   ? Mechanical heart valve present   ? H.  aortic valve replacement at the time of bypass surgery October 2005  ? Moderate to severe pulmonary hypertension ([facility])   ? Peripheral arterial disease ([place])   ? history of left common iliac artery PTA and stenting for a chronic total occlusion 08/26/01  ? ? ?Assessment: ?81 yo female admitted with multifactorial anemia who is on warfarin PTA for afib and an Aortic Mechanical valve replacement. Last seen in warfarin clinic on 4/27 with INR of 3 and dose decreased to '5mg'$  daily except 7.'5mg'$  on Sunday and Thursday. Patient INR on admission 4/29 was 4.1 and today 3.5 (goal per clinic documentation is 2-3).  ? ?Pharmacy consulted to restart warfarin when appropriate. Patient admitted for symptomatic anemia and transfused. INR is now within therapeutic goal at 2.8 (down from 3.5 yesterday), after dose held on 4/29 and 4/30. ?Hgb was 6.5 on 4/29> transfused PRBC x 1>> up to 8.1 on 4/30. Today back down to 7.1. ?4/30 PRBC x 1 ?4/30 Feraheme x 1 ?5/1 Epogen 40,000 units ? ?PTA dose warfarin 5 mg daily, except 7.5 mg on Sunday and Thursday ? ?Goal of Therapy:  ?INR 2-3 ?Monitor platelets by anticoagulation protocol: Yes ?  ?Plan:  ?Give warfarin 5 mg po x 1 ?Monitor daily INR, CBC, clinical course, s/sx  of bleed, PO intake/diet, Drug-Drug Interactions ? ? ? ?Thank you for allowing [place] to participate in this patients care. ?Jens Som, PharmD ?02/06/2022 9:37 AM ? ?**Pharmacist phone directory can be found on [email or web address] listed under [place]** ? ?

## 2022-02-06 NOTE — Progress Notes (Signed)
?  X-cover Note: ?Asked by bedside RN to evaluate the patient for pain.  Patient started having pain after IVIG infusion including leg pain and flank pain.  Patient had been given p.o. Norco and then 1 dose of IV morphine.  She continued to complain of pain. ? ?Due to other patient care issues, was unable to get to the floor until approximately 8:35 PM. ? ?When arrived to the floor, patient was talking pleasantly on the phone.  She was not in any pain.  She states that the pain has resolved. ? ?Advised her to discuss with her hematologist tomorrow about her IVIG infusion. ? ? ?Kristopher Oppenheim, DO ?[facility] ? ?

## 2022-02-06 NOTE — Progress Notes (Signed)
Initial Nutrition Assessment ? ?DOCUMENTATION CODES:  ? ?Non-severe (moderate) malnutrition in context of chronic illness ? ?INTERVENTION:  ? ?Multivitamin w/ minerals daily ?Boost Plus po daily, provides 360 kcal and 14 gm of protein each. ? ?NUTRITION DIAGNOSIS:  ? ?Moderate Malnutrition related to chronic illness (CHF, CKD) as evidenced by mild fat depletion, mild muscle depletion. ? ?GOAL:  ? ?Patient will meet greater than or equal to 90% of their needs ? ?MONITOR:  ? ?PO intake, Supplement acceptance, Labs ? ?REASON FOR ASSESSMENT:  ? ?Consult ?  ? ?ASSESSMENT:  ? ?81 y.o. female presented to the ED with shortness of breath. PMH includes chronic anemia, HTN, GERD, T2DM, CHF, and CKD IV. Pt admitted with symptomatic anemia.  ? ?Pt reports that her appetite has been great at home and here. States that she eats a good variety of vegetables and protein sources. Reports that she drinks 1/2-1 Boost per day at home. Pt reports that she did have some nausea yesterday that resolved with medications.  ?Per EMR, pt PO intake includes: ?4/30: Breakfast 75%, Lunch 100%, Dinner 100% ?5/01: Breakfast 75%, Lunch 100%  ? ?Pt denies any recent weight loss, reports that she has been gaining fluid. Per EMR, pt has not had any weight loss within the past year. Pt reports that within the past few months, she has used a walker to ambulate. Pt with severe BLE edema.  ? ?Discussed adding Boost here as well, to continue home regimen; pt agreeable. Pt asked about financial support in getting ONS while at home. RD to provide pt with coupons for supplements. Also discussed the use of generic/store brand ONS, but pt does not like the taste of them.  ? ?Medications reviewed and include: Colace, SSI 0-9 units q4h, Semglee, Protonix, Prednisone ?Labs reviewed: BUN 138, Creatinine 2.75, Phosphorus 2.4, Hgb A1c 6.6%, 24 hr CBG 108-369 ? ?NUTRITION - FOCUSED PHYSICAL EXAM: ? ?Flowsheet Row Most Recent Value  ?Orbital Region Mild depletion   ?Upper Arm Region No depletion  ?Thoracic and Lumbar Region No depletion  ?Buccal Region Mild depletion  ?Temple Region Mild depletion  ?Clavicle Bone Region Mild depletion  ?Clavicle and Acromion Bone Region Mild depletion  ?Scapular Bone Region Mild depletion  ?Dorsal Hand Moderate depletion  ?Patellar Region Unable to assess  [Severe edema]  ?Anterior Thigh Region Unable to assess  ?Posterior Calf Region Unable to assess  ?Edema (RD Assessment) Severe  ?Hair Reviewed  ?Eyes Reviewed  ?Mouth Reviewed  ?Skin Reviewed  ?Nails Reviewed  ? ?  ? ? ?Diet Order:   ?Diet Order   ? ?       ?  Diet Carb Modified Fluid consistency: Thin; Room service appropriate? Yes  Diet effective now       ?  ? ?  ?  ? ?  ? ?EDUCATION NEEDS:  ? ?No education needs have been identified at this time ? ?Skin:  Skin Assessment: Reviewed RN Assessment ? ?Last BM:  4/30 ? ?Height:  ?Ht Readings from Last 1 Encounters:  ?02/04/22 '4\' 11"'$  (1.499 m)  ? ?Weight:  ?Wt Readings from Last 1 Encounters:  ?02/04/22 63.3 kg  ? ?BMI:  Body mass index is 28.19 kg/m?. ? ?Estimated Nutritional Needs:  ? ?Kcal:  1600-1800 ? ?Protein:  80-95 grams ? ?Fluid:  >/= 1.6 L ? ? ? ?Hermina Barters RD, LDN ?Clinical Dietitian ?See [facility] for contact information.  ?

## 2022-02-06 NOTE — Progress Notes (Addendum)
? ?[facility] ?PROGRESS NOTE ? ? ?[name]:[id number] DOB: 02-Jan-1941 DOA: 02/04/2022 ? ?PCP: [name], MD ? ?Brief History/Interval Summary: 81 y.o. female with medical history significant of multifactorial anemia, A-fib on Coumadin, CAD, status post CABG, status post mechanical aortic valve replacement, severe pulmonary hypertension, vasculitis, CKD, GERD, HLD HTN, PAD diabetes type 2, chronic diastolic CHF presented with worsening shortness of breath.  She was found to be more anemic than usual with a hemoglobin of 6.5.  She was also noted to have worsening renal function compared to her usual baseline.  She was hospitalized for further management.  Blood transfusion was ordered.  ? ?Consultants: Hematology, Dr. Marin Olp ? ?Procedures: None ? ? ? ?Subjective/Interval History: ?Patient denies any shortness of breath this morning.  No nausea vomiting.  No abdominal pain.  Continues to have significant swelling of both legs.   ? ? ? ?Assessment/Plan: ? ?Symptomatic anemia/history of autoimmune hemolytic anemia ?Followed by Dr. [name] at the cancer center. Last seen by her on April 24.  She was on weekly rituximab and changed to monthly recently.  She was also started on steroids.  The dose was decreased to 20 mg once a day.  Based on her note from April 24 plan was to initiate IVIG if anemia continues to worsen.  And then eventual plans to the immunosuppressants. ?Presented with a hemoglobin of 6.5.  She was quite symptomatic.  She was transfused 2 units of PRBC. ?Hemoglobin noted to be 8.1 posttransfusion. ?Seen by hematology yesterday.  Patient was given Feraheme x1.  Epogen was ordered for today. ?Hemoglobin noted to be lower today at 7.1.   ?Await further hematology input. ?Dose of prednisone was decreased to 10 mg daily due to significant pedal edema. ? ?Thrombocytopenia ?Based on review of old labs she appears to have chronic thrombocytopenia which has been mild.  Drop in platelet  counts to 99,000 noted yesterday.  Noted to be 92,000 today.  Continue to monitor.  No evidence of overt bleeding.   ? ?Acute kidney injury in the setting of chronic kidney disease stage IV/hyperkalemia ?Baseline creatinine seems to be between 2.5-2.9.  Came in with creatinine of 3.1.  BUN is noted to be significantly elevated at 130.  Likely due to recent initiation of steroids.  Avoid nephrotoxic agents.  She was prescribed furosemide recently due to lower extremity edema. ?Quite volume overloaded with significant peripheral edema.  Was given IV Lasix x1 yesterday.  Creatinine noted to be better today at 2.75.  She did produce a lot of urine yesterday. ?We will repeat a dose of IV Lasix today. ?She is followed by Dr. [name] with [facility].  May need to involve them if her kidney function worsens. ?Potassium level normal. ? ?Chronic anticoagulation ?On warfarin due to presence of mechanical heart valve.  Also has a history of paroxysmal atrial fibrillation. ?INR was noted to be supratherapeutic at admission.  Warfarin was held.  INR is now therapeutic.  Pharmacy to assist with dosing. ? ?Mechanical aortic valve ?On warfarin. ? ?Paroxysmal atrial fibrillation/coronary artery disease ?Continue metoprolol.  Cardiac status seems to be stable.  Continue warfarin. ? ?Essential hypertension ?Noted to be on amlodipine and clonidine. ? ?Diabetes mellitus type 2 in the setting of renal disease ?HbA1c is pending.  Continue glargine and SSI.   ? ?Hyponatremia ?Likely multifactorial.  Stable. ? ?Hyperlipidemia ?Continue atorvastatin. ? ?Moderate to severe pulmonary hypertension ?Noted to be on tadalafil. ? ?DVT Prophylaxis: On warfarin ?Code Status:  Full code ?Family Communication: Discussed with patient ?Disposition Plan: Hopefully return home when improved ? ?Status is: Inpatient ?Remains inpatient appropriate because: Anemia, fluid overload ? ? ? ?Medications: Scheduled: ? amLODipine  10 mg Oral Daily   ? atorvastatin  80 mg Oral q1800  ? cloNIDine  0.2 mg Oral BID  ? docusate sodium  100 mg Oral BID  ? insulin aspart  0-9 Units Subcutaneous Q4H  ? insulin glargine-yfgn  10 Units Subcutaneous QHS  ? metoprolol succinate  12.5 mg Oral Daily  ? pantoprazole  40 mg Oral BID  ? predniSONE  10 mg Oral Q breakfast  ? sodium chloride flush  3 mL Intravenous Q12H  ? tadalafil  40 mg Oral Daily  ? ?Continuous: ? sodium chloride Stopped (02/05/22 0723)  ? ?PTW:SFKCLE chloride, acetaminophen **OR** acetaminophen, albuterol, ALPRAZolam, HYDROcodone-acetaminophen, ondansetron (ZOFRAN) IV, polyethylene glycol, sodium chloride flush ? ?Antibiotics: ?Anti-infectives (From admission, onward)  ? ? None  ? ?  ? ? ?Objective: ? ?Vital Signs ? ?Vitals:  ? 02/05/22 1644 02/05/22 2047 02/06/22 0425 02/06/22 0930  ?BP: (!) 143/44 (!) 131/40 (!) 151/41 (!) 152/38  ?Pulse: 66 76 74 68  ?Resp: '18 17 18 18  '$ ?Temp: (!) 97.5 ?F (36.4 ?C) 97.6 ?F (36.4 ?C) (!) 97.3 ?F (36.3 ?C) 98.2 ?F (36.8 ?C)  ?TempSrc: Oral Oral Oral   ?SpO2: 98% 98% 98% 97%  ?Weight:      ?Height:      ? ? ?Intake/Output Summary (Last 24 hours) at 02/06/2022 1134 ?Last data filed at 02/06/2022 0800 ?Gross per 24 hour  ?Intake 834.28 ml  ?Output 200 ml  ?Net 634.28 ml  ? ? ?Filed Weights  ? 02/04/22 2248  ?Weight: 63.3 kg  ? ? ?General appearance: Awake alert.  In no distress ?Resp: Clear to auscultation bilaterally.  Normal effort ?Cardio: S1-S2 is normal regular.  No S3-S4.  No rubs murmurs or bruit ?GI: Abdomen is soft.  Nontender nondistended.  Bowel sounds are present normal.  No masses organomegaly ?Extremities: 2-3+ pitting edema bilateral lower extremities. ?Neurologic: Alert and oriented x3.  No focal neurological deficits.  ? ? ? ?Lab Results: ? ?Data Reviewed: I have personally reviewed following labs and reports of the imaging studies ? ?CBC: ?Recent Labs  ?Lab 02/02/22 ?1030 02/04/22 ?1649 02/05/22 ?0741 02/06/22 ?7517  ?WBC 8.0 5.4 5.8 4.3  ?NEUTROABS 7.1  --   --    --   ?HGB 8.8* 6.5* 8.1* 7.1*  ?HCT 26.5* 19.6* 23.7* 20.8*  ?MCV 94.0 95.6 90.5 91.6  ?PLT 134* 118* 99* 92*  ? ? ? ?Basic Metabolic Panel: ?Recent Labs  ?Lab 02/02/22 ?1029 02/04/22 ?1649 02/04/22 ?2000 02/05/22 ?0017 02/06/22 ?4944  ?NA 133* 129*  --  134* 136  ?K 4.0 5.3*  --  4.8 4.5  ?CL 104 101  --  108 108  ?CO2 19* 18*  --  19* 23  ?GLUCOSE 137* 429*  --  156* 109*  ?BUN 114* 130*  --  130* 138*  ?CREATININE 2.58* 3.10*  --  3.00* 2.75*  ?CALCIUM 8.4* 8.0*  --  7.9* 8.4*  ?MG  --   --  1.8  --   --   ?PHOS  --   --  2.4*  --   --   ? ? ? ?GFR: ?Estimated Creatinine Clearance: 12.5 mL/min (A) (by C-G formula based on SCr of 2.75 mg/dL (H)). ? ?Liver Function Tests: ?Recent Labs  ?Lab 02/02/22 ?1029 02/04/22 ?2000 02/05/22 ?0741  ?  AST 30 36 26  ?ALT '27 23 21  '$ ?ALKPHOS 54 45 38  ?BILITOT 0.9 0.5 1.3*  ?PROT 5.8* 5.3* 4.3*  ?ALBUMIN 3.4* 2.8* 2.5*  ? ? ? ?Recent Labs  ?Lab 02/02/22 ?1030  ?LIPASE 43  ? ? ? ?Coagulation Profile: ?Recent Labs  ?Lab 02/02/22 ?1030 02/04/22 ?1649 02/05/22 ?0741 02/06/22 ?1610  ?INR 3.0* 4.1* 3.5* 2.8*  ? ? ? ?Cardiac Enzymes: ?Recent Labs  ?Lab 02/04/22 ?2000  ?CKTOTAL 39  ? ? ? ? ?CBG: ?Recent Labs  ?Lab 02/05/22 ?2011 02/06/22 ?9604 02/06/22 ?0425 02/06/22 ?5409 02/06/22 ?1129  ?GLUCAP 203* 148* 136* 108* 219*  ? ? ? ?Thyroid Function Tests: ?Recent Labs  ?  02/04/22 ?2000  ?TSH 1.652  ? ? ? ?Anemia Panel: ?Recent Labs  ?  02/04/22 ?2000  ?WJXBJYNW29 5,621*  ?FOLATE >40.0  ?FERRITIN 104  ?TIBC 300  ?IRON 38  ?RETICCTPCT 5.0*  ? ? ?Radiology Studies: ?DG Chest 2 View ? ?Result Date: 02/04/2022 ?CLINICAL DATA:  Chest pain EXAM: CHEST - 2 VIEW COMPARISON:  02/02/2022 FINDINGS: Cardiac enlargement. Aortic atherosclerosis. Previous median sternotomy and CABG procedure. Mild diffuse increase interstitial markings. Blunting of the costophrenic angles compatible with small pleural effusions. Calcified granulomas noted in the left base and right midlung. No airspace opacities. Curvature  of the thoracic spine is convex towards the right. IMPRESSION: Cardiac enlargement, small pleural effusions and trace interstitial edema concerning for mild CHF. Electronically Signed   By: Army Melia

## 2022-02-06 NOTE — Plan of Care (Signed)
?  Problem: Education: ?Goal: Knowledge of General Education information will improve ?Description: Including pain rating scale, medication(s)/side effects and non-pharmacologic comfort measures ?Outcome: Completed/Met ?  ?

## 2022-02-06 NOTE — Progress Notes (Signed)
On call hematology covering note  ? ?I got a call from patient's nurse regarding severe bilateral thigh pain after she started IVIG tonight.  The pain is excruciating, she was moaning due to the pain in the background when I talked to her nurse.  Patient was given IV morphine, but the pain has not improved.  Vital signs stable, she is febrile, no chills, no signs of allergy reaction.  IVIG has been held since she developed the pain, which I agree.  This is unusual side effect from IVIG.  Lab reviewed, she has received a blood transfusion, and IVIG was ordered for hemolytic anemia.  Okay to hold tonight.  I asked her nurse to page the on-call hospitalist to evaluate patient in person, to rule out other etiology of pain. I will inform her primary hematologist Dr. Chryl Heck. We will f/u tomorrow.  ? ?Truitt Merle  ?02/06/2022 8:05 PM  ?

## 2022-02-06 NOTE — Progress Notes (Addendum)
Patient started on IVIG. Initially patient tolerated well. RN increased rate. Patient now having new upset stomach, cramping and nausea. Pt. Had a BM and nausea medicine given and symptoms have improved. Rate decreased to 60 mg/kg/hr and will keep patient at this rate. VSS. MD made aware and no new orders at this time. ?

## 2022-02-06 NOTE — Progress Notes (Signed)
Patient having new co of leg tremors, visibly shaking and she is also co of feeling cold. She is also in pain. VSS. Oncologist at bedside as well. RN stopped IVIG and began D5W infusion and pain medicine given to patient. Oncoming RN paging night MD.  ?

## 2022-02-06 NOTE — Progress Notes (Signed)
Dr Burr Medico made aware of patient condition. Shwe suggested to call MD on the house to check on her.  ?Dr Bridgett Larsson made aware of above. Patient continues to have severe pain. Minimal relief from pain at the moment. Lower abdomen and Legs.  ?

## 2022-02-06 NOTE — Progress Notes (Addendum)
HEMATOLOGY-ONCOLOGY PROGRESS NOTE ? ?ASSESSMENT AND PLAN: ? ?This is a very pleasant 81 year old female patient with multifactorial anemia, previously treated with rituximab for ANCA positive hemolytic anemia, previously on maintenance rituximab every 12 weeks presented with worsening anemia thought to be related to her hemolysis.   ? ? She was restarted back on weekly rituximab.  Her hemoglobin had appeared to downtrend, but there was no evidence of hemolysis on her most recent lab work in our office.  She was on prednisone 30 mg daily at that time.  Due to no evidence of hemolysis on her lab work and side effects that she was experiencing from the prednisone, the dose was reduced from 30 mg down to 20 mg.  However, she is now readmitted to the hospital with symptomatic anemia.  She has evidence of hemolysis on her lab work. ? ?She has received 2 units PRBC so far this admission.  She initially had improvement in her hemoglobin but now she is downtrending again.  She received a dose of IV iron and a dose of Procrit.  She is currently on prednisone 10 mg daily.  Recommend increasing prednisone to 20 mg daily.  Begin folic acid 1 mg daily.  We discussed starting IVIG 400 mg/kg daily for 5 days.  Adverse effects have been discussed and she agrees to proceed. ? ?Recommend PRBC transfusion for hemoglobin less than 7. ? ?If no response, we will add other immunosuppressants like mycophenolate mofetil or cyclophosphamide. ? ?Mikey Bussing, DNP, AGPCNP-BC, AOCNP ? ?SUBJECTIVE: Ms. Mcinerney is followed by our office for autoimmune hemolytic anemia.  She is currently receiving rituximab.  At her last visit, her prednisone dosing was decreased from 30 mg down to 20 mg due to lack of evidence of hemolysis on her lab work from that date.  Additionally, she was having side effects from the prednisone including insomnia and edema.  Now admitted for symptomatic anemia.  She has received 2 units PRBCs.  Her hemoglobin improved to 8.1  but is down to 7.1 today.  She is not seeing any active bleeding.  She received IV iron and Procrit over the weekend. ? ?REVIEW OF SYSTEMS:   ?Review of Systems  ?Constitutional:  Negative for chills and fever.  ?HENT: Negative.    ?Eyes: Negative.   ?Respiratory: Negative.    ?Cardiovascular: Negative.   ?Gastrointestinal: Negative.   ?Skin: Negative.   ?Neurological: Negative.   ?Endo/Heme/Allergies: Negative.   ? ?I have reviewed the past medical history, past surgical history, social history and family history with the patient and they are unchanged from previous note. ? ? ?PHYSICAL EXAMINATION: ?ECOG PERFORMANCE STATUS: 2 - Symptomatic, <50% confined to bed ? ?Vitals:  ? 02/06/22 0425 02/06/22 0930  ?BP: (!) 151/41 (!) 152/38  ?Pulse: 74 68  ?Resp: 18 18  ?Temp: (!) 97.3 ?F (36.3 ?C) 98.2 ?F (36.8 ?C)  ?SpO2: 98% 97%  ? ?Filed Weights  ? 02/04/22 2248  ?Weight: 63.3 kg  ? ? ?Intake/Output from previous day: ?04/30 0701 - 05/01 0700 ?In: 774.3 [P.O.:600; I.V.:166.4; IV Piggyback:7.9] ?Out: 675 [Urine:675] ? ?Physical Exam ?Vitals reviewed.  ?Constitutional:   ?   General: She is not in acute distress. ?HENT:  ?   Head: Normocephalic.  ?Eyes:  ?   General: No scleral icterus. ?   Conjunctiva/sclera: Conjunctivae normal.  ?Cardiovascular:  ?   Rate and Rhythm: Normal rate and regular rhythm.  ?Pulmonary:  ?   Effort: Pulmonary effort is normal. No respiratory distress.  ?Abdominal:  ?  General: Bowel sounds are normal.  ?   Palpations: Abdomen is soft.  ?Skin: ?   General: Skin is warm and dry.  ?Neurological:  ?   Mental Status: She is alert and oriented to person, place, and time.  ? ? ?LABORATORY DATA:  ?I have reviewed the data as listed ? ?  Latest Ref Rng & Units 02/06/2022  ?  6:27 AM 02/05/2022  ?  7:41 AM 02/04/2022  ?  8:00 PM  ?CMP  ?Glucose 70 - 99 mg/dL 109   156     ?BUN 8 - 23 mg/dL 138   130     ?Creatinine 0.44 - 1.00 mg/dL 2.75   3.00     ?Sodium 135 - 145 mmol/L 136   134     ?Potassium 3.5 - 5.1  mmol/L 4.5   4.8     ?Chloride 98 - 111 mmol/L 108   108     ?CO2 22 - 32 mmol/L 23   19     ?Calcium 8.9 - 10.3 mg/dL 8.4   7.9     ?Total Protein 6.5 - 8.1 g/dL  4.3   5.3    ?Total Bilirubin 0.3 - 1.2 mg/dL  1.3   0.5    ?Alkaline Phos 38 - 126 U/L  38   45    ?AST 15 - 41 U/L  26   36    ?ALT 0 - 44 U/L  21   23    ? ? ?Lab Results  ?Component Value Date  ? WBC 4.3 02/06/2022  ? HGB 7.1 (L) 02/06/2022  ? HCT 20.8 (L) 02/06/2022  ? MCV 91.6 02/06/2022  ? PLT 92 (L) 02/06/2022  ? NEUTROABS 7.1 02/02/2022  ? ? ?No results found for: CEA1, CEA, [id number], CA125, PSA1 ? ?DG Chest 2 View ? ?Result Date: 02/04/2022 ?CLINICAL DATA:  Chest pain EXAM: CHEST - 2 VIEW COMPARISON:  02/02/2022 FINDINGS: Cardiac enlargement. Aortic atherosclerosis. Previous median sternotomy and CABG procedure. Mild diffuse increase interstitial markings. Blunting of the costophrenic angles compatible with small pleural effusions. Calcified granulomas noted in the left base and right midlung. No airspace opacities. Curvature of the thoracic spine is convex towards the right. IMPRESSION: Cardiac enlargement, small pleural effusions and trace interstitial edema concerning for mild CHF. Electronically Signed   By: Kerby Moors M.D.   On: 02/04/2022 17:21  ? ?DG Abd 1 View ? ?Result Date: 02/04/2022 ?CLINICAL DATA:  Shortness of breath. EXAM: ABDOMEN - 1 VIEW COMPARISON:  None. FINDINGS: A 1.2 cm calcified lung nodule is seen within the lateral aspect of the left lung base. The bowel gas pattern is normal. A mild amount of stool is seen throughout the colon. Radiopaque surgical clips are seen within the right upper quadrant. A radiopaque stent is seen within the left common iliac artery. Extensive calcification of the right common iliac artery is also noted. No radio-opaque calculi or other significant radiographic abnormality are seen. IMPRESSION: 1. Mild stool burden, without evidence of bowel obstruction. 2. Evidence of prior cholecystectomy. 3.  Left common iliac artery stent. Electronically Signed   By: Virgina Norfolk M.D.   On: 02/04/2022 21:53  ? ?DG Chest Portable 1 View ? ?Result Date: 02/02/2022 ?CLINICAL DATA:  Shortness of breath EXAM: PORTABLE CHEST 1 VIEW COMPARISON:  12/15/2021 FINDINGS: Stable cardiomediastinal contours status post CABG. Unchanged calcified granulomas. No focal airspace consolidation, pleural effusion, or pneumothorax. Left glenohumeral joint osteoarthritis. IMPRESSION: No active disease. Electronically Signed  By: Davina Poke D.O.   On: 02/02/2022 11:33   ? ? ?Future Appointments  ?Date Time Provider [place]  ?02/07/2022  8:45 AM [facility] IN[facility] None  ?02/08/2022 11:30 AM [facility] INFUSION [facility] None  ?02/09/2022 11:15 AM [facility]  ?02/09/2022 11:30 AM [facility] INFUSION [facility] None  ?02/10/2022 11:00 AM [facility] INFUSION [facility] None  ?02/17/2022 10:30 AM Jon Billings, RN [facility] None  ?03/07/2022 11:00 AM [facility] VASC 1 - HC [facility]  ?03/07/2022 11:40 AM Marty Heck, MD [facility]  ?03/08/2022  9:45 AM Marzetta Board, DPM [facility]  ?  ? ? LOS: 1 day  ? ?Attending Note ? ?I personally saw the patient, reviewed the chart and examined the patient. The plan of care was discussed with the patient and the admitting team. I agree with the assessment and plan as documented above. Thank you very much for the consultation. ?I saw the patient. Unfortunately she had infusion reaction manifesting with severe abdominal pain, leg pains, chills. Patient says it the worst pain she has experienced since she had children. She was given some IV narcotics, and her pain has dissipated. IVIG was stopped. At this time, we agreed not to rechallenge her. She was in no distress at the time of exam ?It has been quite challenging with complications of steroids and she having worsening hemolytic anemia with taper of steroids. ?I  discussed with her about trying MMF outpatient since she didn't quite respond to ritux and she cant tolerate IVIG. ?She is agreeable to all the recommendations. ?

## 2022-02-06 NOTE — Progress Notes (Signed)
Inpatient Diabetes Program Recommendations ? ?AACE/ADA: New Consensus Statement on Inpatient Glycemic Control (2015) ? ?Target Ranges:  Prepandial:   less than 140 mg/dL ?     Peak postprandial:   less than 180 mg/dL (1-2 hours) ?     Critically ill patients:  140 - 180 mg/dL  ? ?Lab Results  ?Component Value Date  ? GLUCAP 108 (H) 02/06/2022  ? HGBA1C 6.6 (H) 02/04/2022  ? ? ?Review of Glycemic Control ? Latest Reference Range & Units 02/06/22 00:14 02/06/22 04:25 02/06/22 07:49  ?Glucose-Capillary 70 - 99 mg/dL 148 (H) 136 (H) 108 (H)  ?(H): Data is abnormally high ?Diabetes history: Type 2 DM ?Outpatient Diabetes medications: Glipizide 5 mg QD ?Current orders for Inpatient glycemic control: Novolog 0-9 units Q4H, Semglee 10 units QHS ?Prednisone 10 mg QD ? ?Inpatient Diabetes Program Recommendations:   ? ?Noted consult. A1C is at goal and current glucose trends are at goal.  ?Occasional elevations, most likely associated with CHO intake in the setting of prednisone.  ?At this time, consider changing correction to Novolog 0-9 units TID now that patient is eating.  ? ?Thanks, ?Bronson Curb, MSN, RNC-OB ?Diabetes Coordinator ?[phone] (8a-5p) ? ? ? ? ?

## 2022-02-07 ENCOUNTER — Inpatient Hospital Stay: Payer: Medicare HMO

## 2022-02-07 DIAGNOSIS — D649 Anemia, unspecified: Secondary | ICD-10-CM | POA: Diagnosis not present

## 2022-02-07 DIAGNOSIS — D591 Autoimmune hemolytic anemia, unspecified: Secondary | ICD-10-CM | POA: Diagnosis not present

## 2022-02-07 DIAGNOSIS — I4891 Unspecified atrial fibrillation: Secondary | ICD-10-CM | POA: Diagnosis not present

## 2022-02-07 DIAGNOSIS — N179 Acute kidney failure, unspecified: Secondary | ICD-10-CM | POA: Diagnosis not present

## 2022-02-07 LAB — GLUCOSE, CAPILLARY
Glucose-Capillary: 163 mg/dL — ABNORMAL HIGH (ref 70–99)
Glucose-Capillary: 213 mg/dL — ABNORMAL HIGH (ref 70–99)
Glucose-Capillary: 222 mg/dL — ABNORMAL HIGH (ref 70–99)
Glucose-Capillary: 227 mg/dL — ABNORMAL HIGH (ref 70–99)
Glucose-Capillary: 251 mg/dL — ABNORMAL HIGH (ref 70–99)
Glucose-Capillary: 371 mg/dL — ABNORMAL HIGH (ref 70–99)

## 2022-02-07 LAB — RETICULOCYTES
Immature Retic Fract: 21.1 % — ABNORMAL HIGH (ref 2.3–15.9)
RBC.: 1.93 MIL/uL — ABNORMAL LOW (ref 3.87–5.11)
Retic Count, Absolute: 114.8 10*3/uL (ref 19.0–186.0)
Retic Ct Pct: 6 % — ABNORMAL HIGH (ref 0.4–3.1)

## 2022-02-07 LAB — URINALYSIS, ROUTINE W REFLEX MICROSCOPIC
Bilirubin Urine: NEGATIVE
Glucose, UA: NEGATIVE mg/dL
Hgb urine dipstick: NEGATIVE
Ketones, ur: NEGATIVE mg/dL
Nitrite: NEGATIVE
Protein, ur: 100 mg/dL — AB
Specific Gravity, Urine: 1.011 (ref 1.005–1.030)
pH: 5 (ref 5.0–8.0)

## 2022-02-07 LAB — CBC
HCT: 18.1 % — ABNORMAL LOW (ref 36.0–46.0)
Hemoglobin: 6.2 g/dL — CL (ref 12.0–15.0)
MCH: 31.8 pg (ref 26.0–34.0)
MCHC: 34.3 g/dL (ref 30.0–36.0)
MCV: 92.8 fL (ref 80.0–100.0)
Platelets: 80 10*3/uL — ABNORMAL LOW (ref 150–400)
RBC: 1.95 MIL/uL — ABNORMAL LOW (ref 3.87–5.11)
RDW: 18.1 % — ABNORMAL HIGH (ref 11.5–15.5)
WBC: 3.9 10*3/uL — ABNORMAL LOW (ref 4.0–10.5)
nRBC: 0.5 % — ABNORMAL HIGH (ref 0.0–0.2)

## 2022-02-07 LAB — BASIC METABOLIC PANEL
Anion gap: 10 (ref 5–15)
BUN: 144 mg/dL — ABNORMAL HIGH (ref 8–23)
CO2: 19 mmol/L — ABNORMAL LOW (ref 22–32)
Calcium: 8.3 mg/dL — ABNORMAL LOW (ref 8.9–10.3)
Chloride: 105 mmol/L (ref 98–111)
Creatinine, Ser: 2.79 mg/dL — ABNORMAL HIGH (ref 0.44–1.00)
GFR, Estimated: 16 mL/min — ABNORMAL LOW (ref >60)
Glucose, Bld: 219 mg/dL — ABNORMAL HIGH (ref 70–99)
Potassium: 5.3 mmol/L — ABNORMAL HIGH (ref 3.5–5.1)
Sodium: 134 mmol/L — ABNORMAL LOW (ref 135–145)

## 2022-02-07 LAB — PROTIME-INR
INR: 1.7 — ABNORMAL HIGH (ref 0.8–1.2)
Prothrombin Time: 19.9 seconds — ABNORMAL HIGH (ref 11.4–15.2)

## 2022-02-07 LAB — LACTATE DEHYDROGENASE: LDH: 175 U/L (ref 98–192)

## 2022-02-07 LAB — PREPARE RBC (CROSSMATCH)

## 2022-02-07 MED ORDER — WARFARIN - PHARMACIST DOSING INPATIENT: Freq: Every day | Status: DC

## 2022-02-07 MED ORDER — INSULIN ASPART 100 UNIT/ML IJ SOLN
0.0000 [IU] | Freq: Three times a day (TID) | INTRAMUSCULAR | Status: DC
  Administered 2022-02-07: 5 [IU] via SUBCUTANEOUS
  Administered 2022-02-07: 15 [IU] via SUBCUTANEOUS
  Administered 2022-02-08: 3 [IU] via SUBCUTANEOUS
  Administered 2022-02-08 (×2): 5 [IU] via SUBCUTANEOUS
  Administered 2022-02-09 (×2): 3 [IU] via SUBCUTANEOUS
  Administered 2022-02-09: 5 [IU] via SUBCUTANEOUS

## 2022-02-07 MED ORDER — INSULIN ASPART 100 UNIT/ML IJ SOLN
0.0000 [IU] | Freq: Every day | INTRAMUSCULAR | Status: DC
  Administered 2022-02-07: 3 [IU] via SUBCUTANEOUS
  Administered 2022-02-08: 4 [IU] via SUBCUTANEOUS
  Administered 2022-02-09: 2 [IU] via SUBCUTANEOUS

## 2022-02-07 MED ORDER — FUROSEMIDE 10 MG/ML IJ SOLN
80.0000 mg | Freq: Once | INTRAMUSCULAR | Status: AC
  Administered 2022-02-07: 80 mg via INTRAVENOUS
  Filled 2022-02-07: qty 8

## 2022-02-07 MED ORDER — SODIUM ZIRCONIUM CYCLOSILICATE 10 G PO PACK
10.0000 g | PACK | Freq: Once | ORAL | Status: AC
Start: 2022-02-07 — End: 2022-02-07
  Administered 2022-02-07: 10 g via ORAL
  Filled 2022-02-07: qty 1

## 2022-02-07 MED ORDER — SODIUM CHLORIDE 0.9% IV SOLUTION
Freq: Once | INTRAVENOUS | Status: AC
  Administered 2022-02-07: 10 mL/h via INTRAVENOUS

## 2022-02-07 MED ORDER — WARFARIN SODIUM 7.5 MG PO TABS
7.5000 mg | ORAL_TABLET | Freq: Once | ORAL | Status: AC
  Administered 2022-02-07: 7.5 mg via ORAL
  Filled 2022-02-07: qty 1

## 2022-02-07 NOTE — Consult Note (Signed)
? ?  [facility] CM Inpatient Consult ? ? ?02/07/2022 ? ?JASMAN PFEIFLE ?Apr 07, 1941 ?[id number] ? ?[place] Organization [ACO] Patient: [name] ? ?Primary Care Provider:  [name], [name], MD [place] this is an [place] provider that does the [facility] and has an Embedded Chronic Care Management team ?  ? ?Patient is currently active with [facility] for chronic disease management services.  Patient has been engaged by a [place].  Our community based plan of care has focused on disease management and community resource support.   ? ?Met with the patient at the bedside.  Patient endorses Dr. Osborne Casco as her PCP and she has Melvenia Beam as the pharmacist there [[facility]].  [facility] RNCM is following as well.  Patient talked about possibly having home visits through her cardiologist she states "the EMS personnel suggested it with my heart conditions." ? ?Patient will receive a post hospital call and will be evaluated for assessments and disease process education.   ? ?Plan: Ongoing follow up for post hospital needs and update the [facility] RNCM of any additional needs. ? ?Of note, [facility] Care Management services does not replace or interfere with any services that are needed or arranged by inpatient [facility] care management team.  For additional questions or referrals please contact: ? ?Natividad Brood, RN BSN CCM ?[facility] Liaison ? [phone] business mobile phone ?Toll free office [phone]  ?Fax number: [phone] ?[email or web address] ? ? ? ? ?

## 2022-02-07 NOTE — Progress Notes (Signed)
Inpatient Diabetes Program Recommendations ? ?AACE/ADA: New Consensus Statement on Inpatient Glycemic Control  ? ?Target Ranges:  Prepandial:   less than 140 mg/dL ?     Peak postprandial:   less than 180 mg/dL (1-2 hours) ?     Critically ill patients:  140 - 180 mg/dL  ? ? Latest Reference Range & Units 02/07/22 00:04 02/07/22 04:35 02/07/22 07:21  ?Glucose-Capillary 70 - 99 mg/dL 163 (H) 222 (H) 213 (H)  ? ? Latest Reference Range & Units 02/06/22 07:49 02/06/22 11:29 02/06/22 16:20 02/06/22 20:26  ?Glucose-Capillary 70 - 99 mg/dL 108 (H) 219 (H) 281 (H) 227 (H)  ? ?Review of Glycemic Control ? ?Diabetes history: DM2 ?Outpatient Diabetes medications: Glipizide 5 mg QD ?Current orders for Inpatient glycemic control: Novolog 0-9 units Q4H, Semglee 10 units QHS; Prednisone 20 mg QD ?  ?Inpatient Diabetes Program Recommendations:   ? ?Insulin: Please consider changing Novolog correction to AC&HS. If steroids are continued please consider ordering Novolog 3 units TID with meals for meal coverage if patient eats at least 50% of meals. ? ?Thanks, ?Barnie Alderman, RN, MSN, CDE ?Diabetes Coordinator ?Inpatient Diabetes Program ?[phone] (Team Pager from 8am to 5pm) ? ? ?

## 2022-02-07 NOTE — Progress Notes (Signed)
? ?[facility] ?PROGRESS NOTE ? ? ?[name] LNL:[id number] DOB: 1941-02-05 DOA: 02/04/2022 ? ?PCP: [name], MD ? ?Brief History/Interval Summary: 81 y.o. female with medical history significant of multifactorial anemia, A-fib on Coumadin, CAD, status post CABG, status post mechanical aortic valve replacement, severe pulmonary hypertension, vasculitis, CKD, GERD, HLD HTN, PAD diabetes type 2, chronic diastolic CHF presented with worsening shortness of breath.  She was found to be more anemic than usual with a hemoglobin of 6.5.  She was also noted to have worsening renal function compared to her usual baseline.  She was hospitalized for further management.  Blood transfusion was ordered.  ? ?Consultants: Hematology ? ?Procedures: None ? ? ? ?Subjective/Interval History: ?Patient quite anxious about drop in hemoglobin.  Denies any shortness of breath or chest pain this morning.  Concerned about the swelling in her legs.   ? ? ? ?Assessment/Plan: ? ?Symptomatic anemia/history of autoimmune hemolytic anemia ?Followed by Dr. [name] at the cancer center. Last seen by her on April 24.  She was on weekly rituximab and changed to monthly recently.  She was also started on steroids.  The dose was decreased to 20 mg once a day.  Based on her note from April 24 plan was to initiate IVIG if anemia continues to worsen.  And then eventual plans to the immunosuppressants. ?Presented with a hemoglobin of 6.5.  She was quite symptomatic.  She was transfused 2 units of PRBC.  Hemoglobin responded appropriately.  But now it continues to drift down. ?Hematology is following. Patient was given Feraheme x1.  Epogen was given as well.   ?Patient started on IVIG yesterday.  However she did not tolerate it well with initially development of nausea vomiting followed by development of leg pain bilaterally.   ?Dose of prednisone was increased back to 20 mg daily by hematology.   ?Hemoglobin noted to be 6.2 today.  Unit of  PRBC has been ordered. ?Further management per hematology. ? ?Thrombocytopenia ?Based on review of old labs she appears to have chronic thrombocytopenia which has been mild.  Platelet count continues to decrease.  No evidence of overt bleeding.    ? ?Acute kidney injury in the setting of chronic kidney disease stage IV/hyperkalemia ?Baseline creatinine seems to be between 2.5-2.9.  Came in with creatinine of 3.1.  BUN is noted to be significantly elevated at 130.  Likely due to recent initiation of steroids.  Avoid nephrotoxic agents.  She was prescribed furosemide recently due to lower extremity edema. ?Quite volume overloaded with significant peripheral edema.  Has been given Lasix intravenously for the past 2 days.  Give additional dose today.  Patient stable for the most part.  Monitor urine output.   ?She is followed by Dr. [name] with [facility].  May need to involve them if her kidney function worsens. ?Potassium noted to be elevated slightly at 5.3.  Not noted to be on any potassium supplements at this time.  We will give her Lokelma x1.  Repeat labs tomorrow. ? ?Chronic anticoagulation ?On warfarin due to presence of mechanical heart valve.  Also has a history of paroxysmal atrial fibrillation. ?INR was noted to be supratherapeutic at admission.  Warfarin was held.  INR now subtherapeutic.  Pharmacy is assisting. ? ?Mechanical aortic valve ?On warfarin. ? ?Paroxysmal atrial fibrillation/coronary artery disease ?Continue metoprolol.  Cardiac status seems to be stable.  Continue warfarin. ? ?Essential hypertension ?Noted to be on amlodipine and clonidine.  Blood pressure stable for  the most part.  Occasional high readings noted. ? ?Diabetes mellitus type 2 in the setting of renal disease ?HbA1c 6.6.  Continue glargine and SSI.  Change SSI to ACHS. ? ?Hyponatremia ?Likely multifactorial.  Stable. ? ?Hyperlipidemia ?Continue atorvastatin. ? ?Moderate to severe pulmonary hypertension ?Noted  to be on tadalafil. ? ?DVT Prophylaxis: On warfarin ?Code Status: Full code ?Family Communication: Discussed with patient ?Disposition Plan: Hopefully return home when improved.  PT and OT evaluation. ? ?Status is: Inpatient ?Remains inpatient appropriate because: Anemia, fluid overload ? ? ? ?Medications: Scheduled: ? amLODipine  10 mg Oral Daily  ? atorvastatin  80 mg Oral q1800  ? cloNIDine  0.2 mg Oral BID  ? docusate sodium  100 mg Oral BID  ? folic acid  1 mg Oral Daily  ? insulin aspart  0-9 Units Subcutaneous Q4H  ? insulin glargine-yfgn  10 Units Subcutaneous QHS  ? lactose free nutrition  237 mL Oral Q24H  ? metoprolol succinate  12.5 mg Oral Daily  ? multivitamin with minerals  1 tablet Oral Daily  ? pantoprazole  40 mg Oral BID  ? predniSONE  20 mg Oral Q breakfast  ? sodium chloride flush  3 mL Intravenous Q12H  ? tadalafil  40 mg Oral Daily  ? Warfarin - Pharmacist Dosing Inpatient   Does not apply E5277  ? ?Continuous: ? sodium chloride Stopped (02/05/22 0723)  ? ?OEU:MPNTIR chloride, acetaminophen **OR** acetaminophen, albuterol, ALPRAZolam, HYDROcodone-acetaminophen, ondansetron (ZOFRAN) IV, polyethylene glycol, sodium chloride flush ? ?Antibiotics: ?Anti-infectives (From admission, onward)  ? ? None  ? ?  ? ? ?Objective: ? ?Vital Signs ? ?Vitals:  ? 02/07/22 0742 02/07/22 0809 02/07/22 0824 02/07/22 0930  ?BP: (!) 149/69 (!) 149/69 (!) 145/61   ?Pulse: 91 91 68   ?Resp: '18 18 18 18  '$ ?Temp: 98 ?F (36.7 ?C) 98 ?F (36.7 ?C) 97.9 ?F (36.6 ?C)   ?TempSrc: Oral Oral    ?SpO2: 98% 98%    ?Weight:      ?Height:      ? ? ?Intake/Output Summary (Last 24 hours) at 02/07/2022 1051 ?Last data filed at 02/07/2022 0600 ?Gross per 24 hour  ?Intake 720 ml  ?Output 300 ml  ?Net 420 ml  ? ? ?Filed Weights  ? 02/04/22 2248  ?Weight: 63.3 kg  ? ? ?General appearance: Awake alert.  In no distress ?Resp: Clear to auscultation bilaterally.  Normal effort ?Cardio: S1-S2 is normal regular.  No S3-S4.  No rubs murmurs or  bruit ?GI: Abdomen is soft.  Nontender nondistended.  Bowel sounds are present normal.  No masses organomegaly ?Extremities: 2+ edema bilateral lower extremity ?Neurologic: Alert and oriented x3.  No focal neurological deficits.  ? ? ? ?Lab Results: ? ?Data Reviewed: I have personally reviewed following labs and reports of the imaging studies ? ?CBC: ?Recent Labs  ?Lab 02/02/22 ?1030 02/04/22 ?1649 02/05/22 ?0741 02/06/22 ?4431 02/07/22 ?0422  ?WBC 8.0 5.4 5.8 4.3 3.9*  ?NEUTROABS 7.1  --   --   --   --   ?HGB 8.8* 6.5* 8.1* 7.1* 6.2*  ?HCT 26.5* 19.6* 23.7* 20.8* 18.1*  ?MCV 94.0 95.6 90.5 91.6 92.8  ?PLT 134* 118* 99* 92* 80*  ? ? ? ?Basic Metabolic Panel: ?Recent Labs  ?Lab 02/02/22 ?1029 02/04/22 ?1649 02/04/22 ?2000 02/05/22 ?5400 02/06/22 ?8676 02/07/22 ?0422  ?NA 133* 129*  --  134* 136 134*  ?K 4.0 5.3*  --  4.8 4.5 5.3*  ?CL 104 101  --  108 108 105  ?CO2 19* 18*  --  19* 23 19*  ?GLUCOSE 137* 429*  --  156* 109* 219*  ?BUN 114* 130*  --  130* 138* 144*  ?CREATININE 2.58* 3.10*  --  3.00* 2.75* 2.79*  ?CALCIUM 8.4* 8.0*  --  7.9* 8.4* 8.3*  ?MG  --   --  1.8  --   --   --   ?PHOS  --   --  2.4*  --   --   --   ? ? ? ?GFR: ?Estimated Creatinine Clearance: 12.3 mL/min (A) (by C-G formula based on SCr of 2.79 mg/dL (H)). ? ?Liver Function Tests: ?Recent Labs  ?Lab 02/02/22 ?1029 02/04/22 ?2000 02/05/22 ?0741  ?AST 30 36 26  ?ALT '27 23 21  '$ ?ALKPHOS 54 45 38  ?BILITOT 0.9 0.5 1.3*  ?PROT 5.8* 5.3* 4.3*  ?ALBUMIN 3.4* 2.8* 2.5*  ? ? ? ?Recent Labs  ?Lab 02/02/22 ?1030  ?LIPASE 43  ? ? ? ?Coagulation Profile: ?Recent Labs  ?Lab 02/02/22 ?1030 02/04/22 ?1649 02/05/22 ?0741 02/06/22 ?3149 02/07/22 ?0422  ?INR 3.0* 4.1* 3.5* 2.8* 1.7*  ? ? ? ?Cardiac Enzymes: ?Recent Labs  ?Lab 02/04/22 ?2000  ?CKTOTAL 39  ? ? ? ? ?CBG: ?Recent Labs  ?Lab 02/06/22 ?1620 02/06/22 ?2026 02/07/22 ?0004 02/07/22 ?7026 02/07/22 ?3785  ?[place] ? ? ?Thyroid Function Tests: ?Recent Labs  ?  02/04/22 ?2000  ?TSH  1.652  ? ? ? ?Anemia Panel: ?Recent Labs  ?  02/04/22 ?2000 02/06/22 ?1446 02/07/22 ?0422  ?YIFOYDXA12 1,584*  --   --   ?FOLATE >40.0  --   --   ?FERRITIN 104  --   --   ?TIBC 300  --   --   ?IRON 38  --   -

## 2022-02-07 NOTE — Progress Notes (Signed)
Physical Therapy Treatment ?Patient Details ?Name: [name] ?MRN: [id number] ?DOB: 1941/01/16 ?Today's Date: 02/07/2022 ? ? ?History of Present Illness Patient is a 81 year old female with past medical history of atrial fibrillation on metoprolol and Coumadin, coronary artery disease, coronary bypass and aortic valve replacement, severe pulmonary hypertension, an vasculitis presenting for complaints of shortness of breath.  Patient was recently seen at [facility] on on February 02, 2022 approximately 2 days ago for lower extremity edema.  Patient was diagnosed with peripheral edema and started on Lasix 60 mg twice a day for the next 4 days. Arrived to hospital 4/29 with c/o severe shortness of breath and chest heaviness. Chest x-ray indicating Cardiac enlargement, small pleural effusions and trace interstitial  edema concerning for mild CHF. ? ?  ?PT Comments  ? ? Patient received in recliner. Son present in room. She is agreeable to PT. Wants to use bsc prior to ambulation. Performs sit to stand with min guard and pivoted to bsc with min guard no AD. Assisted patient with changing undergarments. Patient then ambulated 200 feet with RW and min guard. She reports feeling okay, enjoying getting out and walking. Patient returned to bed after walking and all needs met. Patient will continue to benefit from skilled PT to improve strength, activity tolerance for return home.  ?   ?Recommendations for follow up therapy are one component of a multi-disciplinary discharge planning process, led by the attending physician.  Recommendations may be updated based on patient status, additional functional criteria and insurance authorization. ? ?Follow Up Recommendations ? Home health PT ?  ?  ?Assistance Recommended at Discharge Intermittent Supervision/Assistance  ?Patient can return home with the following A little help with walking and/or transfers;A little help with bathing/dressing/bathroom;Help with stairs or ramp  for entrance;Assist for transportation;Assistance with cooking/housework ?  ?Equipment Recommendations ? Rollator (4 wheels)  ?  ?Recommendations for Other Services   ? ? ?  ?Precautions / Restrictions Precautions ?Precautions: Fall ?Precaution Comments: Patient's HGB 6.2, was receiving blood transfusion, but IV stopped working. ?Restrictions ?Weight Bearing Restrictions: No  ?  ? ?Mobility ? Bed Mobility ?Overal bed mobility: Modified Independent ?  ?  ?  ?  ?  ?  ?  ?  ? ?Transfers ?Overall transfer level: Modified independent ?Equipment used: Rolling walker (2 wheels) ?Transfers: Sit to/from Stand ?Sit to Stand: Supervision ?  ?  ?  ?  ?  ?  ?  ? ?Ambulation/Gait ?Ambulation/Gait assistance: Min guard ?Gait Distance (Feet): 200 Feet ?Assistive device: Rolling walker (2 wheels) ?Gait Pattern/deviations: Step-through pattern, Decreased step length - right, Decreased step length - left, Trunk flexed ?Gait velocity: decr ?  ?  ?General Gait Details: Cues for proximity to RW ? ? ?Stairs ?  ?  ?  ?  ?  ? ? ?Wheelchair Mobility ?  ? ?Modified Rankin (Stroke Patients Only) ?  ? ? ?  ?Balance Overall balance assessment: Needs assistance ?Sitting-balance support: Feet supported ?Sitting balance-Leahy Scale: Good ?  ?  ?Standing balance support: Bilateral upper extremity supported, During functional activity, Reliant on assistive device for balance ?Standing balance-Leahy Scale: Fair ?  ?  ?  ?  ?  ?  ?  ?  ?  ?  ?  ?  ?  ? ?  ?Cognition Arousal/Alertness: Awake/alert ?  ?Overall Cognitive Status: Within Functional Limits for tasks assessed ?  ?  ?  ?  ?  ?  ?  ?  ?  ?  ?  ?  ?  ?  ?  ?  ?  General Comments: able to follow commands and able to recall fall preventions ?  ?  ? ?  ?Exercises   ? ?  ?General Comments   ?  ?  ? ?Pertinent Vitals/Pain Pain Assessment ?Pain Assessment: No/denies pain  ? ? ?Home Living   ?  ?  ?  ?  ?  ?  ?  ?  ?  ?   ?  ?Prior Function    ?  ?  ?   ? ?PT Goals (current goals can now be found in the  care plan section) Acute Rehab PT Goals ?Patient Stated Goal: Says she likes to walk ?PT Goal Formulation: With patient ?Time For Goal Achievement: 02/19/22 ?Potential to Achieve Goals: Good ?Progress towards PT goals: Progressing toward goals ? ?  ?Frequency ? ? ? Min 3X/week ? ? ? ?  ?PT Plan Current plan remains appropriate  ? ? ?Co-evaluation   ?  ?  ?  ?  ? ?  ?AM-PAC PT "6 Clicks" Mobility   ?Outcome Measure ? Help needed turning from your back to your side while in a flat bed without using bedrails?: None ?Help needed moving from lying on your back to sitting on the side of a flat bed without using bedrails?: None ?Help needed moving to and from a bed to a chair (including a wheelchair)?: A Little ?Help needed standing up from a chair using your arms (e.g., wheelchair or bedside chair)?: A Little ?Help needed to walk in hospital room?: A Little ?Help needed climbing 3-5 steps with a railing? : A Little ?6 Click Score: 20 ? ?  ?End of Session Equipment Utilized During Treatment: Gait belt ?Activity Tolerance: Patient tolerated treatment well ?Patient left: in bed;with call bell/phone within reach;with family/visitor present ?Nurse Communication: Mobility status ?PT Visit Diagnosis: Muscle weakness (generalized) (M62.81) ?  ? ? ?Time: 1315-1340 ?PT Time Calculation (min) (ACUTE ONLY): 25 min ? ?Charges:  $Gait Training: 8-22 mins ?$Therapeutic Activity: 8-22 mins          ?          ? ?Amanda Cockayne, PT, GCS ?02/07/22,2:56 PM ? ? ?

## 2022-02-07 NOTE — Progress Notes (Signed)
Latest Reference Range & Units 02/07/22 04:22  ?Hemoglobin 12.0 - 15.0 g/dL 6.2 (LL)  ?Md on call notifeied vis secure chat.  ?

## 2022-02-07 NOTE — Progress Notes (Signed)
No new order. "will let heme/onc decide if they want to transfuse her again" ?

## 2022-02-07 NOTE — Plan of Care (Signed)
?  Problem: Clinical Measurements: Goal: Will remain free from infection Outcome: Completed/Met Goal: Diagnostic test results will improve Outcome: Completed/Met Goal: Respiratory complications will improve Outcome: Completed/Met Goal: Cardiovascular complication will be avoided Outcome: Completed/Met   

## 2022-02-07 NOTE — Progress Notes (Signed)
ANTICOAGULATION CONSULT NOTE ? ?Pharmacy Consult for warfarin ? ?Indication: atrial fibrillation and Aortic mechanical valve ? ?Allergies  ?Allergen Reactions  ? Flagyl [Metronidazole] Rash  ?  ALL-OVER BODY RASH  ? Ciprofloxacin   ?  Other reaction(s): Stomach upset - tolerates ?Other reaction(s): Stomach upset - tolerates  ? Coreg [Carvedilol] Other (See Comments)  ?  Terrible cramping in the feet and had a lot of bowel movements, but not diarrhea  ? Diflucan [Fluconazole] Diarrhea  ? Losartan Swelling  ?  Patient doesn't recall site of swelling  ? Sulfamethoxazole-Trimethoprim   ?  Other reaction(s): stomach upset .  ? Verapamil Hives  ? Zetia [Ezetimibe] Other (See Comments)  ?  Reaction not recalled  ? Zocor [Simvastatin - High Dose] Other (See Comments)  ?  Reaction not recalled  ? Gatifloxacin Rash  ?  Redness to skin around eye  ? ? ?Patient Measurements: ?Height: '4\' 11"'$  (149.9 cm) ?Weight: 63.3 kg (139 lb 8.8 oz) ?IBW/kg (Calculated) : 43.2 ? ? ?Vital Signs: ?Temp: 98 ?F (36.7 ?C) (05/02 0938) ?Temp Source: Oral (05/02 1829) ?BP: 149/69 (05/02 0742) ?Pulse Rate: 91 (05/02 0742) ? ?Labs: ?Recent Labs  ?  02/04/22 ?2000 02/05/22 ?0741 02/06/22 ?0627 02/07/22 ?0422  ?HGB  --  8.1* 7.1* 6.2*  ?HCT  --  23.7* 20.8* 18.1*  ?PLT  --  99* 92* 80*  ?LABPROT  --  35.1* 29.3* 19.9*  ?INR  --  3.5* 2.8* 1.7*  ?CREATININE  --  3.00* 2.75* 2.79*  ?CKTOTAL 39  --   --   --   ?TROPONINIHS 34*  --   --   --   ? ? ? ?Estimated Creatinine Clearance: 12.3 mL/min (A) (by C-G formula based on SCr of 2.79 mg/dL (H)). ? ? ?Medical History: ?Past Medical History:  ?Diagnosis Date  ? Aortic atherosclerosis ([place]) 01/23/2018  ? Atrial fibrillation ([place])   ? Carotid artery disease ([place])   ? Cholelithiasis 01/23/2018  ? Chronic anticoagulation   ? Coronary artery disease   ? status post coronary artery bypass grafting times 07/10/2004  ? Diabetes mellitus   ? Hypercholesteremia   ? Hypertension   ? Mechanical heart valve present   ? H.  aortic valve replacement at the time of bypass surgery October 2005  ? Moderate to severe pulmonary hypertension ([facility])   ? Peripheral arterial disease ([place])   ? history of left common iliac artery PTA and stenting for a chronic total occlusion 08/26/01  ? ? ?Assessment: ?81 yo female admitted with multifactorial anemia who is on warfarin PTA for afib and an Aortic Mechanical valve replacement. Last seen in warfarin clinic on 4/27 with INR of 3 and dose decreased to '5mg'$  daily except 7.'5mg'$  on Sunday and Thursday. Patient INR on admission 4/29 was 4.1 (goal per clinic documentation is 2-3).  ? ?Pharmacy consulted to restart warfarin when appropriate. Patient admitted for symptomatic anemia and transfused.  ? ?INR is subtherapeutic today at 1.7 (down from 2.8 yesterday), after dose held on 4/29, 4/30, and 5/1. ? ?5/2: Hgb down to 6.2 this morning >> PRBC x 1 infusing now ? ?4/30 PRBC x 1 ?4/30 Feraheme x 1 ?5/1 Epogen 40,000 units ?5/2 PRBC x 1 ? ?PTA dose warfarin 5 mg daily, except 7.5 mg on Sunday and Thursday ? ?Goal of Therapy:  ?INR 2-3 ?Monitor platelets by anticoagulation protocol: Yes ?  ?Plan:  ?Give warfarin 7.5 mg po x 1 ?Monitor daily INR, CBC, clinical course, s/sx of bleed,  PO intake/diet, Drug-Drug Interactions ? ? ? ?Thank you for allowing [place] to participate in this patients care. ?Jens Som, PharmD ?02/07/2022 7:54 AM ? ?**Pharmacist phone directory can be found on [email or web address] listed under [place]** ? ?

## 2022-02-08 ENCOUNTER — Inpatient Hospital Stay: Payer: Medicare HMO

## 2022-02-08 DIAGNOSIS — R102 Pelvic and perineal pain: Secondary | ICD-10-CM | POA: Insufficient documentation

## 2022-02-08 DIAGNOSIS — D591 Autoimmune hemolytic anemia, unspecified: Secondary | ICD-10-CM | POA: Insufficient documentation

## 2022-02-08 DIAGNOSIS — N1832 Chronic kidney disease, stage 3b: Secondary | ICD-10-CM | POA: Insufficient documentation

## 2022-02-08 DIAGNOSIS — N939 Abnormal uterine and vaginal bleeding, unspecified: Secondary | ICD-10-CM | POA: Insufficient documentation

## 2022-02-08 DIAGNOSIS — D649 Anemia, unspecified: Secondary | ICD-10-CM | POA: Diagnosis not present

## 2022-02-08 DIAGNOSIS — I7782 Antineutrophilic cytoplasmic antibody (ANCA) vasculitis: Secondary | ICD-10-CM | POA: Insufficient documentation

## 2022-02-08 DIAGNOSIS — R739 Hyperglycemia, unspecified: Secondary | ICD-10-CM | POA: Insufficient documentation

## 2022-02-08 DIAGNOSIS — R197 Diarrhea, unspecified: Secondary | ICD-10-CM | POA: Insufficient documentation

## 2022-02-08 DIAGNOSIS — M8580 Other specified disorders of bone density and structure, unspecified site: Secondary | ICD-10-CM | POA: Insufficient documentation

## 2022-02-08 DIAGNOSIS — N179 Acute kidney failure, unspecified: Secondary | ICD-10-CM | POA: Diagnosis not present

## 2022-02-08 DIAGNOSIS — N184 Chronic kidney disease, stage 4 (severe): Secondary | ICD-10-CM | POA: Diagnosis not present

## 2022-02-08 LAB — GLUCOSE, CAPILLARY
Glucose-Capillary: 185 mg/dL — ABNORMAL HIGH (ref 70–99)
Glucose-Capillary: 211 mg/dL — ABNORMAL HIGH (ref 70–99)
Glucose-Capillary: 219 mg/dL — ABNORMAL HIGH (ref 70–99)
Glucose-Capillary: 343 mg/dL — ABNORMAL HIGH (ref 70–99)

## 2022-02-08 LAB — BPAM RBC
Blood Product Expiration Date: 202305032359
Blood Product Expiration Date: 202305172359
Blood Product Expiration Date: 202305172359
ISSUE DATE / TIME: 202304300043
ISSUE DATE / TIME: 202304300421
ISSUE DATE / TIME: 202305020804
Unit Type and Rh: 6200
Unit Type and Rh: 6200
Unit Type and Rh: 6200

## 2022-02-08 LAB — BASIC METABOLIC PANEL
Anion gap: 9 (ref 5–15)
BUN: 135 mg/dL — ABNORMAL HIGH (ref 8–23)
CO2: 21 mmol/L — ABNORMAL LOW (ref 22–32)
Calcium: 8.2 mg/dL — ABNORMAL LOW (ref 8.9–10.3)
Chloride: 105 mmol/L (ref 98–111)
Creatinine, Ser: 2.48 mg/dL — ABNORMAL HIGH (ref 0.44–1.00)
GFR, Estimated: 19 mL/min — ABNORMAL LOW (ref >60)
Glucose, Bld: 141 mg/dL — ABNORMAL HIGH (ref 70–99)
Potassium: 4.4 mmol/L (ref 3.5–5.1)
Sodium: 135 mmol/L (ref 135–145)

## 2022-02-08 LAB — CBC
HCT: 21.3 % — ABNORMAL LOW (ref 36.0–46.0)
Hemoglobin: 7.4 g/dL — ABNORMAL LOW (ref 12.0–15.0)
MCH: 32 pg (ref 26.0–34.0)
MCHC: 34.7 g/dL (ref 30.0–36.0)
MCV: 92.2 fL (ref 80.0–100.0)
Platelets: 85 10*3/uL — ABNORMAL LOW (ref 150–400)
RBC: 2.31 MIL/uL — ABNORMAL LOW (ref 3.87–5.11)
RDW: 17.5 % — ABNORMAL HIGH (ref 11.5–15.5)
WBC: 5.9 10*3/uL (ref 4.0–10.5)
nRBC: 2 % — ABNORMAL HIGH (ref 0.0–0.2)

## 2022-02-08 LAB — TYPE AND SCREEN
ABO/RH(D): A POS
Antibody Screen: NEGATIVE
Unit division: 0
Unit division: 0
Unit division: 0

## 2022-02-08 LAB — HEPARIN LEVEL (UNFRACTIONATED): Heparin Unfractionated: 0.44 IU/mL (ref 0.30–0.70)

## 2022-02-08 LAB — PROTIME-INR
INR: 1.8 — ABNORMAL HIGH (ref 0.8–1.2)
Prothrombin Time: 20.4 seconds — ABNORMAL HIGH (ref 11.4–15.2)

## 2022-02-08 MED ORDER — HEPARIN (PORCINE) 25000 UT/250ML-% IV SOLN
900.0000 [IU]/h | INTRAVENOUS | Status: DC
Start: 2022-02-08 — End: 2022-02-09
  Administered 2022-02-08: 900 [IU]/h via INTRAVENOUS
  Filled 2022-02-08: qty 250

## 2022-02-08 MED ORDER — FUROSEMIDE 10 MG/ML IJ SOLN
60.0000 mg | Freq: Two times a day (BID) | INTRAMUSCULAR | Status: DC
  Administered 2022-02-08 – 2022-02-09 (×4): 60 mg via INTRAVENOUS
  Filled 2022-02-08 (×4): qty 6

## 2022-02-08 MED ORDER — WARFARIN SODIUM 7.5 MG PO TABS
7.5000 mg | ORAL_TABLET | Freq: Once | ORAL | Status: AC
  Administered 2022-02-08: 7.5 mg via ORAL
  Filled 2022-02-08: qty 1

## 2022-02-08 MED ORDER — INSULIN ASPART 100 UNIT/ML IJ SOLN
2.0000 [IU] | Freq: Three times a day (TID) | INTRAMUSCULAR | Status: DC
  Administered 2022-02-08 – 2022-02-10 (×5): 2 [IU] via SUBCUTANEOUS

## 2022-02-08 NOTE — Progress Notes (Signed)
HEMATOLOGY-ONCOLOGY PROGRESS NOTE ? ?ASSESSMENT AND PLAN: ? ?This is a very pleasant 81 year old female patient with multifactorial anemia, previously treated with rituximab for ANCA positive hemolytic anemia, previously on maintenance rituximab every 12 weeks presented with worsening anemia thought to be related to her hemolysis.   ? ?She hasnt responded very well to rituximab so far. She continues on prednisone 30 mg po daily, ?When we tried to taper prednisone, she relapsed with worsening hemolysis. ?We attempted IVIG in patient, she had infusion reaction, hence will not be able to re challenge this. ?If she continues to have no response, we will add other immunosuppressants like mycophenolate mofetil or cyclophosphamide outpatient. ?Ok to DC home tomorrow if she remains stable. ?Will arrange for twice a week labs and as needed transfusion and outpatient follow up ? ? ?SUBJECTIVE: [name] is doing better today. She was eating at the time of my visit. ?BLE swelling has improved significantly. ?No bleeding complaints. She continues to feel fatigued. ? ?I have reviewed the past medical history, past surgical history, social history and family history with the patient and they are unchanged from previous note. ? ? ?PHYSICAL EXAMINATION: ?ECOG PERFORMANCE STATUS: 2 - Symptomatic, <50% confined to bed ? ?Vitals:  ? 02/08/22 1514 02/08/22 2055  ?BP: 138/69 (!) 160/42  ?Pulse: 65 73  ?Resp: 18 18  ?Temp: 98.7 ?F (37.1 ?C) 98.4 ?F (36.9 ?C)  ?SpO2: 99% 98%  ? ?Filed Weights  ? 02/04/22 2248  ?Weight: 139 lb 8.8 oz (63.3 kg)  ? ? ?Intake/Output from previous day: ?05/02 0701 - 05/03 0700 ?In: 941 [P.O.:477; Blood:351] ?Out: 450 [Urine:450] ? ?Physical Exam ?Vitals reviewed.  ?Constitutional:   ?   General: She is not in acute distress. ?HENT:  ?   Head: Normocephalic.  ?Eyes:  ?   General: No scleral icterus. ?   Conjunctiva/sclera: Conjunctivae normal.  ?Cardiovascular:  ?   Rate and Rhythm: Normal rate and regular  rhythm.  ?Musculoskeletal:  ?   Right lower leg: Edema present.  ?   Left lower leg: Edema present.  ?Skin: ?   General: Skin is warm and dry.  ?Neurological:  ?   Mental Status: She is alert and oriented to person, place, and time.  ? ? ?LABORATORY DATA:  ?I have reviewed the data as listed ? ?  Latest Ref Rng & Units 02/08/2022  ?  6:02 AM 02/07/2022  ?  4:22 AM 02/06/2022  ?  6:27 AM  ?CMP  ?Glucose 70 - 99 mg/dL 141   219   109    ?BUN 8 - 23 mg/dL 135   144   138    ?Creatinine 0.44 - 1.00 mg/dL 2.48   2.79   2.75    ?Sodium 135 - 145 mmol/L 135   134   136    ?Potassium 3.5 - 5.1 mmol/L 4.4   5.3   4.5    ?Chloride 98 - 111 mmol/L 105   105   108    ?CO2 22 - 32 mmol/L '21   19   23    '$ ?Calcium 8.9 - 10.3 mg/dL 8.2   8.3   8.4    ? ? ?Lab Results  ?Component Value Date  ? WBC 5.9 02/08/2022  ? HGB 7.4 (L) 02/08/2022  ? HCT 21.3 (L) 02/08/2022  ? MCV 92.2 02/08/2022  ? PLT 85 (L) 02/08/2022  ? NEUTROABS 7.1 02/02/2022  ? ? ?No results found for: CEA1, CEA, [id number], CA125, PSA1 ? ?  DG Chest 2 View ? ?Result Date: 02/04/2022 ?CLINICAL DATA:  Chest pain EXAM: CHEST - 2 VIEW COMPARISON:  02/02/2022 FINDINGS: Cardiac enlargement. Aortic atherosclerosis. Previous median sternotomy and CABG procedure. Mild diffuse increase interstitial markings. Blunting of the costophrenic angles compatible with small pleural effusions. Calcified granulomas noted in the left base and right midlung. No airspace opacities. Curvature of the thoracic spine is convex towards the right. IMPRESSION: Cardiac enlargement, small pleural effusions and trace interstitial edema concerning for mild CHF. Electronically Signed   By: Kerby Moors M.D.   On: 02/04/2022 17:21  ? ?DG Abd 1 View ? ?Result Date: 02/04/2022 ?CLINICAL DATA:  Shortness of breath. EXAM: ABDOMEN - 1 VIEW COMPARISON:  None. FINDINGS: A 1.2 cm calcified lung nodule is seen within the lateral aspect of the left lung base. The bowel gas pattern is normal. A mild amount of stool is seen  throughout the colon. Radiopaque surgical clips are seen within the right upper quadrant. A radiopaque stent is seen within the left common iliac artery. Extensive calcification of the right common iliac artery is also noted. No radio-opaque calculi or other significant radiographic abnormality are seen. IMPRESSION: 1. Mild stool burden, without evidence of bowel obstruction. 2. Evidence of prior cholecystectomy. 3. Left common iliac artery stent. Electronically Signed   By: Virgina Norfolk M.D.   On: 02/04/2022 21:53  ? ?DG Chest Portable 1 View ? ?Result Date: 02/02/2022 ?CLINICAL DATA:  Shortness of breath EXAM: PORTABLE CHEST 1 VIEW COMPARISON:  12/15/2021 FINDINGS: Stable cardiomediastinal contours status post CABG. Unchanged calcified granulomas. No focal airspace consolidation, pleural effusion, or pneumothorax. Left glenohumeral joint osteoarthritis. IMPRESSION: No active disease. Electronically Signed   By: Davina Poke D.O.   On: 02/02/2022 11:33   ? ? ?Future Appointments  ?Date Time Provider [place]  ?02/17/2022 10:30 AM Jon Billings, RN [facility] None  ?03/07/2022 11:00 AM [facility] 1 - [facility]  ?03/07/2022 11:40 AM Marty Heck, MD [facility]  ?03/08/2022  9:45 AM Marzetta Board, DPM [facility]  ?  ? ? LOS: 3 days  ? ?  ? ?

## 2022-02-08 NOTE — Progress Notes (Signed)
Inpatient Diabetes Program Recommendations ? ?AACE/ADA: New Consensus Statement on Inpatient Glycemic Control (2015) ? ?Target Ranges:  Prepandial:   less than 140 mg/dL ?     Peak postprandial:   less than 180 mg/dL (1-2 hours) ?     Critically ill patients:  140 - 180 mg/dL  ? ?Lab Results  ?Component Value Date  ? GLUCAP 211 (H) 02/08/2022  ? HGBA1C 6.6 (H) 02/04/2022  ? ? ?Review of Glycemic Control ? Latest Reference Range & Units 02/07/22 11:37 02/07/22 16:13 02/07/22 21:07 02/08/22 08:18 02/08/22 11:12  ?Glucose-Capillary 70 - 99 mg/dL 371 (H) 227 (H) 251 (H) 185 (H) 211 (H)  ?Diabetes history: DM2 ?Outpatient Diabetes medications: Glipizide 5 mg QD ?Current orders for Inpatient glycemic control: Novolog 0-15 units tid with meals and HS, Semglee 10 units QHS; Prednisone 20 mg QD ? ?Inpatient Diabetes Program Recommendations:   ? ?Consider adding Novolog 2 units tid with meals (meal coverage- hold if patient eats less than 50% or NPO).  ? ?Thanks,  ?Adah Perl, RN, BC-ADM ?Inpatient Diabetes Coordinator ?Pager [phone]  (8a-5p) ? ? ? ? ?

## 2022-02-08 NOTE — Progress Notes (Addendum)
?PROGRESS NOTE ? ? ? ?[name]  OJJ:[id number] DOB: 1941-09-18 DOA: 02/04/2022 ?PCP: [name], MD  ? ?Brief Narrative:  ?81 y.o. female with medical history significant of multifactorial anemia, A-fib on Coumadin, CAD, status post CABG, status post mechanical aortic valve replacement, severe pulmonary hypertension, vasculitis, CKD stage IV, GERD, HLD HTN, PAD, diabetes type 2, chronic diastolic CHF presented with worsening shortness of breath.  She was found to be more anemic than usual with a hemoglobin of 6.5 with worsening renal function compared to her baseline.  She was hospitalized.  She received blood transfusion.  Hematology was consulted. ? ?Assessment & Plan: ?  ?Symptomatic anemia/history of autoimmune hemolytic anemia ?-Follows up with Dr. Iruku/hematology as an outpatient: Was on oral prednisone and injectable rituximab as an outpatient ?-Presented with hemoglobin of 6.5.  Status post 3 units of packed red cells transfusion since hospitalization; received 1 unit on 02/07/2022.  Hemoglobin 7.4 today.  Continue prednisone ?-Monitor H&H.  Follow further recommendations from hematology ? ?Thrombocytopenia, chronic ?-Stable.  Monitor ? ?AKI on CKD stage IV ?-Baseline creatinine of 2.5-2.9.  Presented with creatinine of 3.1.  Creatinine improving, 2.48 today.  Has been getting IV Lasix for the last 3 days.  Still has significant peripheral edema.  We will give another dose of IV Lasix and put her on scheduled oral Lasix from tomorrow onwards. ? ?Hyperkalemia ?-Improved.  Received a dose of Lokelma on 02/07/2022 ? ?Paroxysmal A-fib ?History of mechanical aortic valve requiring chronic anticoagulation ?-Rate controlled.  Continue metoprolol.  Coumadin dose as per pharmacy. ? ?CAD ?Hyperlipidemia ?Hypertension ?-Currently stable.  Continue metoprolol and statin.  Continue amlodipine and clonidine ? ?Diabetes mellitus type II with hyperglycemia ?-A1c 6.6.  Continue glargine with CBGs with  SSI ? ?Moderate to severe pulmonary hypertension ?-Continue tadalafil ? ?Physical deconditioning ?-PT recommends home health PT ? ?Moderate malnutrition ?-Follow nutrition recomendations ? ? ?DVT prophylaxis: Coumadin ?Code Status: Full ?Family Communication: None at bedside ?Disposition Plan: ?Status is: Inpatient ?Remains inpatient appropriate because: Of severity of illness. ? ?Consultants: Hematology ? ?Procedures: None ? ?Antimicrobials: None ? ? ?Subjective: ?Patient seen and examined at bedside.  Still complains of weakness and lower extremity swelling.  No fever, vomiting, worsening shortness of breath or chest pain reported. ? ?Objective: ?Vitals:  ? 02/07/22 1644 02/07/22 2029 02/08/22 0527 02/08/22 0820  ?BP: (!) 151/46 (!) 140/38 (!) 139/40 (!) 160/51  ?Pulse: 62 68 70 80  ?Resp: '18 18  16  '$ ?Temp: 98 ?F (36.7 ?C) (!) 97.5 ?F (36.4 ?C) 98.1 ?F (36.7 ?C) 98 ?F (36.7 ?C)  ?TempSrc:  Oral Oral Oral  ?SpO2: 100% 98% 98% 98%  ?Weight:      ?Height:      ? ? ?Intake/Output Summary (Last 24 hours) at 02/08/2022 1146 ?Last data filed at 02/08/2022 0600 ?Gross per 24 hour  ?Intake 708 ml  ?Output 250 ml  ?Net 458 ml  ? ?Filed Weights  ? 02/04/22 2248  ?Weight: 63.3 kg  ? ? ?Examination: ? ?General exam: Appears calm and comfortable.  Looks chronically ill and deconditioned.  Currently on room air. ?Respiratory system: Bilateral decreased breath sounds at bases with some scattered crackles ?Cardiovascular system: S1 & S2 heard, Rate controlled ?Gastrointestinal system: Abdomen is nondistended, soft and nontender. Normal bowel sounds heard. ?Extremities: No cyanosis, clubbing; bilateral lower extremity pitting edema present ?Central nervous system: Alert and oriented. No focal neurological deficits. Moving extremities ?Skin: No rashes, lesions or ulcers ?Psychiatry: Affect is mostly flat.  No signs of agitation. ? ? ? ?Data Reviewed: I have personally reviewed following labs and imaging studies ? ?CBC: ?Recent Labs   ?Lab 02/02/22 ?1030 02/04/22 ?1649 02/05/22 ?0741 02/06/22 ?4944 02/07/22 ?0422 02/08/22 ?0602  ?WBC 8.0 5.4 5.8 4.3 3.9* 5.9  ?NEUTROABS 7.1  --   --   --   --   --   ?HGB 8.8* 6.5* 8.1* 7.1* 6.2* 7.4*  ?HCT 26.5* 19.6* 23.7* 20.8* 18.1* 21.3*  ?MCV 94.0 95.6 90.5 91.6 92.8 92.2  ?PLT 134* 118* 99* 92* 80* 85*  ? ?Basic Metabolic Panel: ?Recent Labs  ?Lab 02/04/22 ?1649 02/04/22 ?2000 02/05/22 ?9675 02/06/22 ?9163 02/07/22 ?0422 02/08/22 ?0602  ?NA 129*  --  134* 136 134* 135  ?K 5.3*  --  4.8 4.5 5.3* 4.4  ?CL 101  --  108 108 105 105  ?CO2 18*  --  19* 23 19* 21*  ?GLUCOSE 429*  --  156* 109* 219* 141*  ?BUN 130*  --  130* 138* 144* 135*  ?CREATININE 3.10*  --  3.00* 2.75* 2.79* 2.48*  ?CALCIUM 8.0*  --  7.9* 8.4* 8.3* 8.2*  ?MG  --  1.8  --   --   --   --   ?PHOS  --  2.4*  --   --   --   --   ? ?GFR: ?Estimated Creatinine Clearance: 13.9 mL/min (A) (by C-G formula based on SCr of 2.48 mg/dL (H)). ?Liver Function Tests: ?Recent Labs  ?Lab 02/02/22 ?1029 02/04/22 ?2000 02/05/22 ?0741  ?AST 30 36 26  ?ALT '27 23 21  '$ ?ALKPHOS 54 45 38  ?BILITOT 0.9 0.5 1.3*  ?PROT 5.8* 5.3* 4.3*  ?ALBUMIN 3.4* 2.8* 2.5*  ? ?Recent Labs  ?Lab 02/02/22 ?1030  ?LIPASE 43  ? ?No results for input(s): AMMONIA in the last 168 hours. ?Coagulation Profile: ?Recent Labs  ?Lab 02/04/22 ?1649 02/05/22 ?0741 02/06/22 ?8466 02/07/22 ?0422 02/08/22 ?0602  ?INR 4.1* 3.5* 2.8* 1.7* 1.8*  ? ?Cardiac Enzymes: ?Recent Labs  ?Lab 02/04/22 ?2000  ?CKTOTAL 39  ? ?BNP (last 3 results) ?No results for input(s): PROBNP in the last 8760 hours. ?HbA1C: ?No results for input(s): HGBA1C in the last 72 hours. ?CBG: ?Recent Labs  ?Lab 02/07/22 ?1137 02/07/22 ?1613 02/07/22 ?2107 02/08/22 ?5993 02/08/22 ?1112  ?GLUCAP 371* 227* 251* 185* 211*  ? ?Lipid Profile: ?No results for input(s): CHOL, HDL, LDLCALC, TRIG, CHOLHDL, LDLDIRECT in the last 72 hours. ?Thyroid Function Tests: ?No results for input(s): TSH, T4TOTAL, FREET4, T3FREE, THYROIDAB in the last 72  hours. ?Anemia Panel: ?Recent Labs  ?  02/06/22 ?1446 02/07/22 ?0422  ?RETICCTPCT 5.5* 6.0*  ? ?Sepsis Labs: ?No results for input(s): PROCALCITON, LATICACIDVEN in the last 168 hours. ? ?No results found for this or any previous visit (from the past 240 hour(s)).  ? ? ? ? ? ?Radiology Studies: ?No results found. ? ? ? ? ? ?Scheduled Meds: ? amLODipine  10 mg Oral Daily  ? atorvastatin  80 mg Oral q1800  ? cloNIDine  0.2 mg Oral BID  ? docusate sodium  100 mg Oral BID  ? folic acid  1 mg Oral Daily  ? insulin aspart  0-15 Units Subcutaneous TID WC  ? insulin aspart  0-5 Units Subcutaneous QHS  ? insulin glargine-yfgn  10 Units Subcutaneous QHS  ? lactose free nutrition  237 mL Oral Q24H  ? metoprolol succinate  12.5 mg Oral Daily  ? multivitamin with minerals  1 tablet Oral Daily  ?  pantoprazole  40 mg Oral BID  ? predniSONE  20 mg Oral Q breakfast  ? sodium chloride flush  3 mL Intravenous Q12H  ? tadalafil  40 mg Oral Daily  ? warfarin  7.5 mg Oral ONCE-1600  ? Warfarin - Pharmacist Dosing Inpatient   Does not apply [id number]  ? ?Continuous Infusions: ? sodium chloride Stopped (02/05/22 0723)  ? ? ? ? ? ? ? ? ?[name], MD ?[facility] ?02/08/2022, 11:46 AM  ? ?

## 2022-02-08 NOTE — Progress Notes (Signed)
ANTICOAGULATION CONSULT NOTE ? ?Pharmacy Consult for warfarin ? ?Indication: atrial fibrillation and Aortic mechanical valve ? ?Allergies  ?Allergen Reactions  ? Flagyl [Metronidazole] Rash  ?  ALL-OVER BODY RASH  ? Ciprofloxacin   ?  Other reaction(s): Stomach upset - tolerates ?Other reaction(s): Stomach upset - tolerates  ? Coreg [Carvedilol] Other (See Comments)  ?  Terrible cramping in the feet and had a lot of bowel movements, but not diarrhea  ? Diflucan [Fluconazole] Diarrhea  ? Losartan Swelling  ?  Patient doesn't recall site of swelling  ? Privigen [Immune Globulin (Human)] Other (See Comments)  ?  Severe/excruciating pain  ? Sulfamethoxazole-Trimethoprim   ?  Other reaction(s): stomach upset .  ? Verapamil Hives  ? Zetia [Ezetimibe] Other (See Comments)  ?  Reaction not recalled  ? Zocor [Simvastatin - High Dose] Other (See Comments)  ?  Reaction not recalled  ? Gatifloxacin Rash  ?  Redness to skin around eye  ? ? ?Patient Measurements: ?Height: '4\' 11"'$  (149.9 cm) ?Weight: 63.3 kg (139 lb 8.8 oz) ?IBW/kg (Calculated) : 43.2 ? ? ?Vital Signs: ?Temp: 98.4 ?F (36.9 ?C) (05/03 2055) ?Temp Source: Oral (05/03 2055) ?BP: 160/42 (05/03 2055) ?Pulse Rate: 73 (05/03 2055) ? ?Labs: ?Recent Labs  ?  02/06/22 ?0627 02/07/22 ?0422 02/08/22 ?0602 02/08/22 ?2043  ?HGB 7.1* 6.2* 7.4*  --   ?HCT 20.8* 18.1* 21.3*  --   ?PLT 92* 80* 85*  --   ?LABPROT 29.3* 19.9* 20.4*  --   ?INR 2.8* 1.7* 1.8*  --   ?HEPARINUNFRC  --   --   --  0.44  ?CREATININE 2.75* 2.79* 2.48*  --   ? ? ? ?Estimated Creatinine Clearance: 13.9 mL/min (A) (by C-G formula based on SCr of 2.48 mg/dL (H)). ? ? ?Medical History: ?Past Medical History:  ?Diagnosis Date  ? Aortic atherosclerosis ([place]) 01/23/2018  ? Atrial fibrillation ([place])   ? Carotid artery disease ([place])   ? Cholelithiasis 01/23/2018  ? Chronic anticoagulation   ? Coronary artery disease   ? status post coronary artery bypass grafting times 07/10/2004  ? Diabetes mellitus   ?  Hypercholesteremia   ? Hypertension   ? Mechanical heart valve present   ? H. aortic valve replacement at the time of bypass surgery October 2005  ? Moderate to severe pulmonary hypertension ([facility])   ? Peripheral arterial disease ([place])   ? history of left common iliac artery PTA and stenting for a chronic total occlusion 08/26/01  ? ? ?Assessment: ?81 yo female admitted with multifactorial anemia who is on warfarin PTA for afib and an Aortic Mechanical valve replacement. Last seen in warfarin clinic on 4/27 with INR of 3 and dose decreased to '5mg'$  daily except 7.'5mg'$  on Sunday and Thursday. Patient INR on admission 4/29 was 4.1 (goal per clinic documentation is 2-3).  ? ?Pharmacy consulted to restart warfarin when appropriate. Patient admitted for symptomatic anemia and transfused.  ? ?INR is subtherapeutic today at 1.8 (down from 2.8 on 5/1), after dose held on 4/29, 4/30, and 5/1. ? ?5/3: Hgb up to 7.4 this morning  ? ?4/30 PRBC x 1 ?4/30 Feraheme x 1 ?5/1 Epogen 40,000 units ?5/2 PRBC x 1 ? ?PTA dose warfarin 5 mg daily, except 7.5 mg on Sunday and Thursday ? ?HL came back therapeutic tonight. Cont same rate and check confirm in AM.  ?Goal of Therapy:  ?INR 2-3 ?Heparin level 0.3-0.5 ?Monitor platelets by anticoagulation protocol: Yes ?  ?Plan:  ?Give  warfarin 7.5 mg po x 1 ?Monitor daily INR, CBC, clinical course, s/sx of bleed, PO intake/diet, Drug-Drug Interactions ?Continue heparin at 900 units/hr ?HL in AM ? ?Onnie Boer, PharmD, BCIDP, AAHIVP, CPP ?Infectious Disease Pharmacist ?02/08/2022 9:20 PM ? ? ? ? ? ?

## 2022-02-08 NOTE — Progress Notes (Addendum)
ANTICOAGULATION CONSULT NOTE ? ?Pharmacy Consult for warfarin ? ?Indication: atrial fibrillation and Aortic mechanical valve ? ?Allergies  ?Allergen Reactions  ? Flagyl [Metronidazole] Rash  ?  ALL-OVER BODY RASH  ? Ciprofloxacin   ?  Other reaction(s): Stomach upset - tolerates ?Other reaction(s): Stomach upset - tolerates  ? Coreg [Carvedilol] Other (See Comments)  ?  Terrible cramping in the feet and had a lot of bowel movements, but not diarrhea  ? Diflucan [Fluconazole] Diarrhea  ? Losartan Swelling  ?  Patient doesn't recall site of swelling  ? Privigen [Immune Globulin (Human)] Other (See Comments)  ?  Severe/excruciating pain  ? Sulfamethoxazole-Trimethoprim   ?  Other reaction(s): stomach upset .  ? Verapamil Hives  ? Zetia [Ezetimibe] Other (See Comments)  ?  Reaction not recalled  ? Zocor [Simvastatin - High Dose] Other (See Comments)  ?  Reaction not recalled  ? Gatifloxacin Rash  ?  Redness to skin around eye  ? ? ?Patient Measurements: ?Height: '4\' 11"'$  (149.9 cm) ?Weight: 63.3 kg (139 lb 8.8 oz) ?IBW/kg (Calculated) : 43.2 ? ? ?Vital Signs: ?Temp: 98 ?F (36.7 ?C) (05/03 0820) ?Temp Source: Oral (05/03 0820) ?BP: 160/51 (05/03 0820) ?Pulse Rate: 80 (05/03 0820) ? ?Labs: ?Recent Labs  ?  02/06/22 ?0627 02/07/22 ?0422 02/08/22 ?0602  ?HGB 7.1* 6.2* 7.4*  ?HCT 20.8* 18.1* 21.3*  ?PLT 92* 80* 85*  ?LABPROT 29.3* 19.9* 20.4*  ?INR 2.8* 1.7* 1.8*  ?CREATININE 2.75* 2.79* 2.48*  ? ? ? ?Estimated Creatinine Clearance: 13.9 mL/min (A) (by C-G formula based on SCr of 2.48 mg/dL (H)). ? ? ?Medical History: ?Past Medical History:  ?Diagnosis Date  ? Aortic atherosclerosis ([place]) 01/23/2018  ? Atrial fibrillation ([place])   ? Carotid artery disease ([place])   ? Cholelithiasis 01/23/2018  ? Chronic anticoagulation   ? Coronary artery disease   ? status post coronary artery bypass grafting times 07/10/2004  ? Diabetes mellitus   ? Hypercholesteremia   ? Hypertension   ? Mechanical heart valve present   ? H. aortic valve  replacement at the time of bypass surgery October 2005  ? Moderate to severe pulmonary hypertension (HCC)   ? Peripheral arterial disease ([place])   ? history of left common iliac artery PTA and stenting for a chronic total occlusion 08/26/01  ? ? ?Assessment: ?81 yo female admitted with multifactorial anemia who is on warfarin PTA for afib and an Aortic Mechanical valve replacement. Last seen in warfarin clinic on 4/27 with INR of 3 and dose decreased to '5mg'$  daily except 7.'5mg'$  on Sunday and Thursday. Patient INR on admission 4/29 was 4.1 (goal per clinic documentation is 2-3).  ? ?Pharmacy consulted to restart warfarin when appropriate. Patient admitted for symptomatic anemia and transfused.  ? ?INR is subtherapeutic today at 1.8 (down from 2.8 on 5/1), after dose held on 4/29, 4/30, and 5/1. ? ?5/3: Hgb up to 7.4 this morning  ? ?4/30 PRBC x 1 ?4/30 Feraheme x 1 ?5/1 Epogen 40,000 units ?5/2 PRBC x 1 ? ?PTA dose warfarin 5 mg daily, except 7.5 mg on Sunday and Thursday ? ?Goal of Therapy:  ?INR 2-3 ?Heparin level 0.3-0.5 ?Monitor platelets by anticoagulation protocol: Yes ?  ?Plan:  ?Give warfarin 7.5 mg po x 1 ?Monitor daily INR, CBC, clinical course, s/sx of bleed, PO intake/diet, Drug-Drug Interactions ? ? ? ?Awesome Jared A. Levada Dy, PharmD, BCPS, FNKF ?Clinical Pharmacist ?[facility] ?Please utilize Amion for appropriate phone number to reach the unit pharmacist ([place]) ? ?  Addendum:  ?Patient subtherapeutic on warfarin with mechanical aortic valve. D/W Dr. Starla Link, will bridge with heparin until INT >/= to 2. Will use low goal with no bolus.  ? ?Plan:  ?Heparin drip at 900 units/hr ?Heparin level in 8 hours and daily ? ? ?

## 2022-02-08 NOTE — Care Management Important Message (Signed)
Important Message ? ?Patient Details  ?Name: [name] ?MRN: [id number] ?Date of Birth: 1940-12-27 ? ? ?Medicare Important Message Given:  Yes ? ? ? ? ?Laurian Edrington ?02/08/2022, 3:43 PM ?

## 2022-02-08 NOTE — Plan of Care (Signed)
?  Problem: Health Behavior/Discharge Planning: ?Goal: Ability to manage health-related needs will improve ?Outcome: Progressing ?  ?Problem: Clinical Measurements: ?Goal: Ability to maintain clinical measurements within normal limits will improve ?Outcome: Progressing ?  ?Problem: Pain Managment: ?Goal: General experience of comfort will improve ?Outcome: Progressing ?  ?

## 2022-02-08 NOTE — Progress Notes (Signed)
Occupational Therapy Treatment ?Patient Details ?Name: [name] ?MRN: [id number] ?DOB: 12-15-1940 ?Today's Date: 02/08/2022 ? ? ?History of present illness Patient is a 81 year old female with past medical history of atrial fibrillation on metoprolol and Coumadin, coronary artery disease, coronary bypass and aortic valve replacement, severe pulmonary hypertension, an vasculitis presenting for complaints of shortness of breath.  Patient was recently seen at [facility] on on February 02, 2022 approximately 2 days ago for lower extremity edema.  Patient was diagnosed with peripheral edema and started on Lasix 60 mg twice a day for the next 4 days. Arrived to hospital 4/29 with c/o severe shortness of breath and chest heaviness. Chest x-ray indicating Cardiac enlargement, small pleural effusions and trace interstitial  edema concerning for mild CHF. ?  ?OT comments ? Patient had increased hip pain on this date, mainly on RUE. Patient continues to make progress with supervision for transfers, mobility, and stand balance. Patient did require increased assistance to get to supine with BLEs due to hip pain. Patient is good candidate for continued OT with HHOT once discharged home.   ? ?Recommendations for follow up therapy are one component of a multi-disciplinary discharge planning process, led by the attending physician.  Recommendations may be updated based on patient status, additional functional criteria and insurance authorization. ?   ?Follow Up Recommendations ? Home health OT  ?  ?Assistance Recommended at Discharge Intermittent Supervision/Assistance  ?Patient can return home with the following ? Assistance with cooking/housework;Assist for transportation ?  ?Equipment Recommendations ? None recommended by OT  ?  ?Recommendations for Other Services   ? ?  ?Precautions / Restrictions Precautions ?Precautions: Fall ?Restrictions ?Weight Bearing Restrictions: No  ? ? ?  ? ?Mobility Bed Mobility ?Overal bed  mobility: Modified Independent, Needs Assistance ?Bed Mobility: Sit to Supine ?  ?  ?  ?Sit to supine: Min assist ?  ?General bed mobility comments: seated on EOB required min assist for LE to return to supine due to pain ?  ? ?Transfers ?Overall transfer level: Modified independent ?Equipment used: Rolling walker (2 wheels) ?  ?  ?  ?  ?  ?  ?  ?General transfer comment: performed transfer training in room with supervision due to complaints of hip pain ?  ?  ?Balance Overall balance assessment: Needs assistance ?Sitting-balance support: Feet supported ?Sitting balance-Leahy Scale: Good ?  ?  ?Standing balance support: Bilateral upper extremity supported, No upper extremity supported, During functional activity ?Standing balance-Leahy Scale: Fair ?Standing balance comment: no UE support while standing at sink for grooming tasks ?  ?  ?  ?  ?  ?  ?  ?  ?  ?  ?  ?   ? ?ADL either performed or assessed with clinical judgement  ? ?ADL Overall ADL's : Needs assistance/impaired ?  ?  ?Grooming: Wash/dry hands;Wash/dry face;Oral care;Brushing hair;Set up;Standing ?Grooming Details (indicate cue type and reason): setup for standing at sink ?  ?  ?  ?  ?  ?  ?  ?  ?Toilet Transfer: Supervision/safety;Rolling walker (2 wheels) ?Toilet Transfer Details (indicate cue type and reason): transfer training in room with RW ?  ?  ?  ?  ?  ?General ADL Comments: increased time for rest break due to hip pain ?  ? ?Extremity/Trunk Assessment   ?  ?  ?  ?  ?  ? ?Vision   ?  ?  ?Perception   ?  ?Praxis   ?  ? ?  Cognition Arousal/Alertness: Awake/alert ?Behavior During Therapy: [facility] for tasks assessed/performed ?Overall Cognitive Status: Within Functional Limits for tasks assessed ?  ?  ?  ?  ?  ?  ?  ?  ?  ?  ?  ?  ?  ?  ?  ?  ?  ?  ?  ?   ?Exercises   ? ?  ?Shoulder Instructions   ? ? ?  ?General Comments    ? ? ?Pertinent Vitals/ Pain       Pain Assessment ?Pain Assessment: 0-10 ?Pain Score: 7  ?Faces Pain Scale: Hurts even more ?Pain  Location: BLE hips, RLE more ?Pain Descriptors / Indicators: Guarding, Sore, Grimacing ?Pain Intervention(s): Limited activity within patient's tolerance, Monitored during session, Patient requesting pain meds-RN notified ? ?Home Living   ?  ?  ?  ?  ?  ?  ?  ?  ?  ?  ?  ?  ?  ?  ?  ?  ?  ?  ? ?  ?Prior Functioning/Environment    ?  ?  ?  ?   ? ?Frequency ? Min 2X/week  ? ? ? ? ?  ?Progress Toward Goals ? ?OT Goals(current goals can now be found in the care plan section) ? Progress towards OT goals: Progressing toward goals ? ?Acute Rehab OT Goals ?Patient Stated Goal: go home ?OT Goal Formulation: With patient ?Time For Goal Achievement: 02/19/22 ?Potential to Achieve Goals: Good ?ADL Goals ?Pt Will Perform Grooming: with modified independence;standing ?Pt Will Perform Lower Body Dressing: with modified independence;sit to/from stand ?Pt Will Transfer to Toilet: with modified independence;ambulating ?Additional ADL Goal #1: Pt will demonstrate independence with 3 fall prevention strategies during ADL/IADL and functional mobility.  ?Plan Discharge plan remains appropriate   ? ?Co-evaluation ? ? ?   ?  ?  ?  ?  ? ?  ?AM-PAC OT "6 Clicks" Daily Activity     ?Outcome Measure ? ? Help from another person eating meals?: None ?Help from another person taking care of personal grooming?: A Little ?Help from another person toileting, which includes using toliet, bedpan, or urinal?: A Little ?Help from another person bathing (including washing, rinsing, drying)?: A Little ?Help from another person to put on and taking off regular upper body clothing?: A Little ?Help from another person to put on and taking off regular lower body clothing?: A Little ?6 Click Score: 19 ? ?  ?End of Session Equipment Utilized During Treatment: Gait belt;Rolling walker (2 wheels) ? ?OT Visit Diagnosis: Other abnormalities of gait and mobility (R26.89);Pain ?Pain - Right/Left: Right ?Pain - part of body: Hip ?  ?Activity Tolerance Patient  tolerated treatment well;Patient limited by pain ?  ?Patient Left in bed;with call bell/phone within reach;with bed alarm set ?  ?Nurse Communication Mobility status;Patient requests pain meds ?  ? ?   ? ?Time: 4327-6147 ?OT Time Calculation (min): 35 min ? ?Charges: OT General Charges ?$OT Visit: 1 Visit ?OT Treatments ?$Self Care/Home Management : 23-37 mins ? ?Lodema Hong, OTA ?Acute Rehabilitation Services  ?Pager [phone] ?Office [phone] ? ? ?[place] ?02/08/2022, 10:07 AM ?

## 2022-02-09 ENCOUNTER — Inpatient Hospital Stay: Payer: Medicare HMO

## 2022-02-09 DIAGNOSIS — N184 Chronic kidney disease, stage 4 (severe): Secondary | ICD-10-CM | POA: Diagnosis not present

## 2022-02-09 DIAGNOSIS — I776 Arteritis, unspecified: Secondary | ICD-10-CM | POA: Diagnosis not present

## 2022-02-09 DIAGNOSIS — I4891 Unspecified atrial fibrillation: Secondary | ICD-10-CM | POA: Diagnosis not present

## 2022-02-09 DIAGNOSIS — D649 Anemia, unspecified: Secondary | ICD-10-CM | POA: Diagnosis not present

## 2022-02-09 DIAGNOSIS — N189 Chronic kidney disease, unspecified: Secondary | ICD-10-CM | POA: Diagnosis not present

## 2022-02-09 DIAGNOSIS — D591 Autoimmune hemolytic anemia, unspecified: Secondary | ICD-10-CM | POA: Diagnosis not present

## 2022-02-09 DIAGNOSIS — I2510 Atherosclerotic heart disease of native coronary artery without angina pectoris: Secondary | ICD-10-CM | POA: Diagnosis not present

## 2022-02-09 DIAGNOSIS — Z7189 Other specified counseling: Secondary | ICD-10-CM

## 2022-02-09 DIAGNOSIS — Z515 Encounter for palliative care: Secondary | ICD-10-CM

## 2022-02-09 LAB — BASIC METABOLIC PANEL
Anion gap: 8 (ref 5–15)
BUN: 125 mg/dL — ABNORMAL HIGH (ref 8–23)
CO2: 20 mmol/L — ABNORMAL LOW (ref 22–32)
Calcium: 8 mg/dL — ABNORMAL LOW (ref 8.9–10.3)
Chloride: 106 mmol/L (ref 98–111)
Creatinine, Ser: 2.86 mg/dL — ABNORMAL HIGH (ref 0.44–1.00)
GFR, Estimated: 16 mL/min — ABNORMAL LOW (ref >60)
Glucose, Bld: 272 mg/dL — ABNORMAL HIGH (ref 70–99)
Potassium: 4.4 mmol/L (ref 3.5–5.1)
Sodium: 134 mmol/L — ABNORMAL LOW (ref 135–145)

## 2022-02-09 LAB — PROTIME-INR
INR: 2.4 — ABNORMAL HIGH (ref 0.8–1.2)
Prothrombin Time: 26.3 seconds — ABNORMAL HIGH (ref 11.4–15.2)

## 2022-02-09 LAB — CBC
HCT: 20.8 % — ABNORMAL LOW (ref 36.0–46.0)
Hemoglobin: 7 g/dL — ABNORMAL LOW (ref 12.0–15.0)
MCH: 32.3 pg (ref 26.0–34.0)
MCHC: 33.7 g/dL (ref 30.0–36.0)
MCV: 95.9 fL (ref 80.0–100.0)
Platelets: 89 10*3/uL — ABNORMAL LOW (ref 150–400)
RBC: 2.17 MIL/uL — ABNORMAL LOW (ref 3.87–5.11)
RDW: 18.2 % — ABNORMAL HIGH (ref 11.5–15.5)
WBC: 4.1 10*3/uL (ref 4.0–10.5)
nRBC: 7.1 % — ABNORMAL HIGH (ref 0.0–0.2)

## 2022-02-09 LAB — GLUCOSE, CAPILLARY
Glucose-Capillary: 161 mg/dL — ABNORMAL HIGH (ref 70–99)
Glucose-Capillary: 173 mg/dL — ABNORMAL HIGH (ref 70–99)
Glucose-Capillary: 239 mg/dL — ABNORMAL HIGH (ref 70–99)
Glucose-Capillary: 247 mg/dL — ABNORMAL HIGH (ref 70–99)

## 2022-02-09 LAB — HEPARIN LEVEL (UNFRACTIONATED): Heparin Unfractionated: 0.66 IU/mL (ref 0.30–0.70)

## 2022-02-09 LAB — PREPARE RBC (CROSSMATCH)

## 2022-02-09 LAB — MAGNESIUM: Magnesium: 1.7 mg/dL (ref 1.7–2.4)

## 2022-02-09 MED ORDER — SODIUM CHLORIDE 0.9% IV SOLUTION: Freq: Once | INTRAVENOUS | Status: DC

## 2022-02-09 MED ORDER — PREDNISONE 20 MG PO TABS
30.0000 mg | ORAL_TABLET | Freq: Every day | ORAL | Status: DC
  Administered 2022-02-10: 30 mg via ORAL
  Filled 2022-02-09: qty 1

## 2022-02-09 MED ORDER — WARFARIN SODIUM 5 MG PO TABS
5.0000 mg | ORAL_TABLET | Freq: Once | ORAL | Status: AC
  Administered 2022-02-09: 5 mg via ORAL
  Filled 2022-02-09: qty 1

## 2022-02-09 MED ORDER — ALUM & MAG HYDROXIDE-SIMETH 200-200-20 MG/5ML PO SUSP: 15.0000 mL | ORAL | Status: DC | PRN

## 2022-02-09 NOTE — Progress Notes (Signed)
?PROGRESS NOTE ? ? ? ?[name]  UDJ:[id number] DOB: August 14, 1941 DOA: 02/04/2022 ?PCP: [name], MD  ? ?Brief Narrative:  ?81 y.o. female with medical history significant of multifactorial anemia, A-fib on Coumadin, CAD, status post CABG, status post mechanical aortic valve replacement, severe pulmonary hypertension, vasculitis, CKD stage IV, GERD, HLD HTN, PAD, diabetes type 2, chronic diastolic CHF presented with worsening shortness of breath.  She was found to be more anemic than usual with a hemoglobin of 6.5 with worsening renal function compared to her baseline.  She was hospitalized.  She received blood transfusion.  Hematology was consulted. ? ?Assessment & Plan: ?  ?Symptomatic anemia/history of autoimmune hemolytic anemia ?-Follows up with Dr. Iruku/hematology as an outpatient: Was on oral prednisone and injectable rituximab as an outpatient ?-Presented with hemoglobin of 6.5.  Status post 3 units of packed red cells transfusion since hospitalization; received 1 unit on 02/07/2022.  Hemoglobin 7 today.  Transfuse 1 more unit of packed red cells today.  Prednisone dose increased to 30 mg daily by hematology. ?-Monitor H&H.  ?-Possible discharge home tomorrow if hemoglobin remains stable.  Outpatient follow-up with hematology. ? ?Thrombocytopenia, chronic ?-Stable.  Monitor ? ?AKI on CKD stage IV ?-Baseline creatinine of 2.5-2.9.  Presented with creatinine of 3.1.  Creatinine 2.86 today.  Has been getting IV Lasix for the last 3 days.  Still has significant peripheral edema.  Continue Lasix 60 mg IV every 12 hours.  Monitor renal function. ? ?Hyperkalemia ?-Improved.  Received a dose of Lokelma on 02/07/2022 ? ?Paroxysmal A-fib ?History of mechanical aortic valve requiring chronic anticoagulation ?-Rate controlled.  Continue metoprolol.  Coumadin dose as per pharmacy. ? ?CAD ?Hyperlipidemia ?Hypertension ?-Currently stable.  Continue metoprolol and statin.  Continue amlodipine and  clonidine ? ?Diabetes mellitus type II with hyperglycemia ?-A1c 6.6.  Continue glargine with CBGs with SSI ? ?Moderate to severe pulmonary hypertension ?-Continue tadalafil ? ?Physical deconditioning ?-PT recommends home health PT ? ?Moderate malnutrition ?-Follow nutrition recommendations ? ?Hyponatremia ?-Mild.  Monitor ? ?Goals of care ?-Overall prognosis is guarded to poor.  We will consult palliative care for goals of care discussion. ? ? ?DVT prophylaxis: Coumadin ?Code Status: Full ?Family Communication: None at bedside ?Disposition Plan: ?Status is: Inpatient ?Remains inpatient appropriate because: Of severity of illness.  Possible discharge tomorrow if hemoglobin and creatinine remain stable. ? ?Consultants: Hematology ? ?Procedures: None ? ?Antimicrobials: None ? ? ?Subjective: ?Patient seen and examined at bedside.  Does not feel well this morning and complains of extreme weakness and some epigastric discomfort.  No fever, vomiting, worsening shortness of breath reported.  Leg swelling is improving slightly.   ? ?Objective: ?Vitals:  ? 02/08/22 2055 02/09/22 0520 02/09/22 5885 02/09/22 0902  ?BP: (!) 160/42  (!) 138/42 (!) 177/81  ?Pulse: 73  78 91  ?Resp: '18  17 17  '$ ?Temp: 98.4 ?F (36.9 ?C)  98.1 ?F (36.7 ?C) 98.2 ?F (36.8 ?C)  ?TempSrc: Oral  Oral   ?SpO2: 98%  98% 100%  ?Weight:  63.6 kg    ?Height:      ? ? ?Intake/Output Summary (Last 24 hours) at 02/09/2022 0958 ?Last data filed at 02/09/2022 0900 ?Gross per 24 hour  ?Intake 590.72 ml  ?Output 1400 ml  ?Net -809.28 ml  ? ? ?Filed Weights  ? 02/04/22 2248 02/09/22 0520  ?Weight: 63.3 kg 63.6 kg  ? ? ?Examination: ? ?General: On room air.  No distress.  Looks chronically ill and deconditioned. ?ENT/neck: No thyromegaly.  JVD is not elevated  ?respiratory: Decreased breath sounds at bases bilaterally with some crackles; no wheezing  ?CVS: S1-S2 heard, rate controlled currently ?Abdominal: Soft, nontender, slightly distended; no organomegaly, normal bowel  sounds are heard ?Extremities: Bilateral lower extremity 2+ edema present; no cyanosis  ?CNS: Awake and alert.  No focal neurologic deficit.  Moves extremities ?Lymph: No obvious lymphadenopathy ?Skin: No obvious ecchymosis/lesions  ?psych: Looks intermittently anxious.  No signs of agitation.  Musculoskeletal: No obvious joint swelling/deformity ? ? ? ? ?Data Reviewed: I have personally reviewed following labs and imaging studies ? ?CBC: ?Recent Labs  ?Lab 02/02/22 ?1030 02/04/22 ?1649 02/05/22 ?7209 02/06/22 ?4709 02/07/22 ?0422 02/08/22 ?0602 02/09/22 ?6283  ?WBC 8.0   < > 5.8 4.3 3.9* 5.9 4.1  ?NEUTROABS 7.1  --   --   --   --   --   --   ?HGB 8.8*   < > 8.1* 7.1* 6.2* 7.4* 7.0*  ?HCT 26.5*   < > 23.7* 20.8* 18.1* 21.3* 20.8*  ?MCV 94.0   < > 90.5 91.6 92.8 92.2 95.9  ?PLT 134*   < > 99* 92* 80* 85* 89*  ? < > = values in this interval not displayed.  ? ? ?Basic Metabolic Panel: ?Recent Labs  ?Lab 02/04/22 ?2000 02/05/22 ?6629 02/06/22 ?4765 02/07/22 ?0422 02/08/22 ?0602 02/09/22 ?4650  ?NA  --  134* 136 134* 135 134*  ?K  --  4.8 4.5 5.3* 4.4 4.4  ?CL  --  108 108 105 105 106  ?CO2  --  19* 23 19* 21* 20*  ?GLUCOSE  --  156* 109* 219* 141* 272*  ?BUN  --  130* 138* 144* 135* 125*  ?CREATININE  --  3.00* 2.75* 2.79* 2.48* 2.86*  ?CALCIUM  --  7.9* 8.4* 8.3* 8.2* 8.0*  ?MG 1.8  --   --   --   --  1.7  ?PHOS 2.4*  --   --   --   --   --   ? ? ?GFR: ?Estimated Creatinine Clearance: 12.1 mL/min (A) (by C-G formula based on SCr of 2.86 mg/dL (H)). ?Liver Function Tests: ?Recent Labs  ?Lab 02/02/22 ?1029 02/04/22 ?2000 02/05/22 ?0741  ?AST 30 36 26  ?ALT '27 23 21  '$ ?ALKPHOS 54 45 38  ?BILITOT 0.9 0.5 1.3*  ?PROT 5.8* 5.3* 4.3*  ?ALBUMIN 3.4* 2.8* 2.5*  ? ? ?Recent Labs  ?Lab 02/02/22 ?1030  ?LIPASE 43  ? ? ?No results for input(s): AMMONIA in the last 168 hours. ?Coagulation Profile: ?Recent Labs  ?Lab 02/05/22 ?0741 02/06/22 ?3546 02/07/22 ?0422 02/08/22 ?0602 02/09/22 ?5681  ?INR 3.5* 2.8* 1.7* 1.8* 2.4*   ? ? ?Cardiac Enzymes: ?Recent Labs  ?Lab 02/04/22 ?2000  ?CKTOTAL 39  ? ? ?BNP (last 3 results) ?No results for input(s): PROBNP in the last 8760 hours. ?HbA1C: ?No results for input(s): HGBA1C in the last 72 hours. ?CBG: ?Recent Labs  ?Lab 02/08/22 ?2751 02/08/22 ?1112 02/08/22 ?1514 02/08/22 ?2056 02/09/22 ?0741  ?GLUCAP 185* 211* 219* 343* 161*  ? ? ?Lipid Profile: ?No results for input(s): CHOL, HDL, LDLCALC, TRIG, CHOLHDL, LDLDIRECT in the last 72 hours. ?Thyroid Function Tests: ?No results for input(s): TSH, T4TOTAL, FREET4, T3FREE, THYROIDAB in the last 72 hours. ?Anemia Panel: ?Recent Labs  ?  02/06/22 ?1446 02/07/22 ?0422  ?RETICCTPCT 5.5* 6.0*  ? ? ?Sepsis Labs: ?No results for input(s): PROCALCITON, LATICACIDVEN in the last 168 hours. ? ?No results found for this or any previous visit (  from the past 240 hour(s)).  ? ? ? ? ? ?Radiology Studies: ?No results found. ? ? ? ? ? ?Scheduled Meds: ? sodium chloride   Intravenous Once  ? sodium chloride   Intravenous Once  ? amLODipine  10 mg Oral Daily  ? atorvastatin  80 mg Oral q1800  ? cloNIDine  0.2 mg Oral BID  ? docusate sodium  100 mg Oral BID  ? folic acid  1 mg Oral Daily  ? furosemide  60 mg Intravenous Q12H  ? insulin aspart  0-15 Units Subcutaneous TID WC  ? insulin aspart  0-5 Units Subcutaneous QHS  ? insulin aspart  2 Units Subcutaneous TID WC  ? insulin glargine-yfgn  10 Units Subcutaneous QHS  ? lactose free nutrition  237 mL Oral Q24H  ? metoprolol succinate  12.5 mg Oral Daily  ? multivitamin with minerals  1 tablet Oral Daily  ? pantoprazole  40 mg Oral BID  ? [START ON 02/10/2022] predniSONE  30 mg Oral Q breakfast  ? sodium chloride flush  3 mL Intravenous Q12H  ? tadalafil  40 mg Oral Daily  ? warfarin  5 mg Oral ONCE-1600  ? Warfarin - Pharmacist Dosing Inpatient   Does not apply [id number]  ? ?Continuous Infusions: ? sodium chloride Stopped (02/05/22 0723)  ? ? ? ? ? ? ? ? ?[name], MD ?[facility] ?02/09/2022, 9:58 AM  ? ?

## 2022-02-09 NOTE — Consult Note (Signed)
? ?Palliative Care Consult Note  ?                                ?Date: 02/09/2022  ? ?Patient Name: [name]  ?DOB: 02/14/1941  MRN: [id number]  Age / Sex: 81 y.o., female  ?PCP: [name] Pao, MD ?Referring Physician: [name], MD ? ?Reason for Consultation: Establishing goals of care ? ?HPI/Patient Profile: 81 y.o. female  with past medical history of multifactorial anemia previously treated with rituximab for ANCA positive hemolytic anemia, A-fib on Coumadin, chronic diastolic CHF, CAD status post CABG, status post aortic valve replacement, severe pulmonary hypertension, vasculitis, CKD stage IV (baseline creatinine 2.5 through 2.9), GERD, HLD, HTN, PAD, and diabetes type 2.she presented to the emergency department on 02/04/2022 with worsening shortness of breath.  She was found to have hemoglobin of 6.5 and was treated with a blood transfusion.  She was also found to have worsening renal function with creatinine of 3.1.  ?She was admitted to [facility] with symptomatic anemia and AKI on CKD stage IV. ? ? ?Past Medical History:  ?Diagnosis Date  ? Aortic atherosclerosis ([place]) 01/23/2018  ? Atrial fibrillation ([place])   ? Carotid artery disease ([place])   ? Cholelithiasis 01/23/2018  ? Chronic anticoagulation   ? Coronary artery disease   ? status post coronary artery bypass grafting times 07/10/2004  ? Diabetes mellitus   ? Hypercholesteremia   ? Hypertension   ? Mechanical heart valve present   ? H. aortic valve replacement at the time of bypass surgery October 2005  ? Moderate to severe pulmonary hypertension (HCC)   ? Peripheral arterial disease ([place])   ? history of left common iliac artery PTA and stenting for a chronic total occlusion 08/26/01  ? ? ?Subjective:  ? ?I have reviewed medical records including EPIC notes, labs and imaging,  and met at bedside with patient to discuss diagnosis, prognosis, GOC, EOL wishes, disposition, and options.  She is out of  bed to the recliner.  She is alert and oriented.  She has no acute complaints. ? ?I introduced Palliative Medicine as specialized medical care for people living with serious illness. It focuses on providing relief from the symptoms and stress of a serious illness.  ? ?We discussed a brief life review of the patient.  She was born in [place] area[place]  She moved to [place] in 1960 with her husband.  They had small 3 small girls at the time.  Her husband worked and she stayed home to raise her children.  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  She has been widowed since 2001.  She has 3 living children (she had 5 children total but unfortunately 2 of her daughters are deceased).  Daughter [name] lives in [place], daughter [name] lives in [place], and son [name] lives in [place]. ? ?[name] lives alone in her home in [place].  As far as functional status, she is ambulatory and independent with all ADLs.  She continues to do light housework and prepare her own meals.  Since March, she has needed some additional help with grocery shopping etc. ? ?We discussed his/her current illness and what it means in the larger context of her ongoing co-morbidities. Discussed that she has multiple medical problems including anemia, kidney failure, and heart failure.  Patient verbalizes understanding.  Provided education on the natural trajectory of chronic illness,  emphasizing that it is non-curable and progressive. Discussed that chronic illness results in decreased functional status over time. ? ?I attempted to elicit values and goals of care important to the patient.  She speaks to the importance of independence.  She wants to remain in her home and "doing things for myself" as long as possible.  However, she recognizes that her health is declining.  She tells me that she is 81 years old and sometimes she feels tired.   ? ?Advanced directives, concepts specific to code status, artifical  feeding and hydration, and rehospitalization were considered and discussed.  MOST form was introduced and explained.  Provided a copy of "hard choices" book. ? ?I asked [name] who she would want to make medical decisions for her if she was unable to make those decisions for herself.  She tells me that she would want her 3 children to work together to make decisions on her behalf.  She trusts them to make decisions that would be consistent with her values and goals.  She declines assistance to create an HCPOA document while in the hospital. ? ?We did discuss code status. Encouraged patient to consider DNR/DNI status understanding evidenced based poor outcomes in similar hospitalized patients, as the cause of the arrest is likely associated with chronic/terminal disease rather than a reversible acute cardio-pulmonary event. I explained that DNR/DNI does not change the medical plan and it only comes into effect after a person has arrested (died).  It is a protective measure to keep [place] from harming the patient in their last moments of life. She seems to indicate that she would agree that DNR is appropriate however she is not yet ready to make that decision. ? ?Discussed the importance of continued conversation with patient and her medical team regarding overall plan of care and treatment options, ensuring decisions are within the context of the patients values and GOCs. ? ? ?Review of Systems  ?All other systems reviewed and are negative. ? ?Objective:  ? ?Primary Diagnoses: ?Present on Admission: ? Symptomatic anemia ? Hypercholesteremia ? Hypertension ? Coronary artery disease ? Hyponatremia ? Moderate to severe pulmonary hypertension (HCC) ? CKD (chronic kidney disease), stage IV ([place]) ? Autoimmune hemolytic anemia (HCC) ? PAF (paroxysmal atrial fibrillation) ([place]) ? GERD (gastroesophageal reflux disease) ? Thrombophilia ([place]) ? Hyperkalemia ? Acute renal failure superimposed on stage 3b chronic kidney disease  ([place]) ? ? ?Physical Exam ?Vitals reviewed.  ?Constitutional:   ?   General: She is not in acute distress. ?   Comments: Chronically ill-appearing  ?Pulmonary:  ?   Effort: Pulmonary effort is normal.  ?Musculoskeletal:  ?   Right lower leg: Edema present.  ?   Left lower leg: Edema present.  ?Neurological:  ?   Mental Status: She is alert and oriented to person, place, and time.  ? ? ?Vital Signs:  ?BP 133/69 (BP Location: Right Arm)   Pulse 79   Temp 98.8 ?F (37.1 ?C) (Oral)   Resp 17   Ht _0  (1.499 m)   Wt 63.6 kg   SpO2 99%   BMI 28.32 kg/m?  ? ?Palliative Assessment/Data: PPS 60% ? ? ? ? ?Assessment & Plan:  ? ?SUMMARY OF RECOMMENDATIONS   ?Full code full scope for now ?Goal of care is to return home and maintain independence as long as possible ?Does understand she has noncurable and progressive illness ?Recommend outpatient palliative ?PMT will follow-up tomorrow ? ?Primary Decision Maker: ?PATIENT ? ?Prognosis:  ?Unable to determine ? ?  Discharge Planning:  ?Goal is home ? ? ? ?Thank you for allowing [place] to participate in the care of [name] ? ? ?MDM - [place] ? ? ?Signed by: ?Elie Confer, NP ?Palliative Medicine Team ? ?Team Phone # [phone]  ?For individual providers, please see AMION ? ? ? ? ? ? ? ? ? ? ? ? ? ? ? ?

## 2022-02-09 NOTE — Progress Notes (Signed)
PT Cancellation Note ? ?Patient Details ?Name: [name] ?MRN: [id number] ?DOB: [birth year] ? ? ?Cancelled Treatment:    Reason Eval/Treat Not Completed: Medical issues which prohibited therapy Patient currently receiving blood transfusion. PT will re-attempt as time allows.  ? ?Krisy Dix A. Gilford Rile, PT, DPT ?Acute Rehabilitation Services ?Pager [phone] ?Office [phone] ? ? ? ?Joyann Spidle A Brittain Hosie ?02/09/2022, 3:10 PM ?

## 2022-02-09 NOTE — Progress Notes (Signed)
Inpatient Diabetes Program Recommendations ? ?AACE/ADA: New Consensus Statement on Inpatient Glycemic Control (2015) ? ?Target Ranges:  Prepandial:   less than 140 mg/dL ?     Peak postprandial:   less than 180 mg/dL (1-2 hours) ?     Critically ill patients:  140 - 180 mg/dL  ? ?Lab Results  ?Component Value Date  ? GLUCAP 161 (H) 02/09/2022  ? HGBA1C 6.6 (H) 02/04/2022  ? ? ?Review of Glycemic Control ? Latest Reference Range & Units 02/08/22 11:12 02/08/22 15:14 02/08/22 20:56 02/09/22 07:41  ?Glucose-Capillary 70 - 99 mg/dL 211 (H) 219 (H) 343 (H) 161 (H)  ?(H): Data is abnormally high ?Diabetes history: DM2 ?Outpatient Diabetes medications: Glipizide 5 mg QD ?Current orders for Inpatient glycemic control: Novolog 0-15 units tid with meals and HS, Semglee 10 units QHS; Prednisone 30 mg QD ?  ?Inpatient Diabetes Program Recommendations:   ?  ?Consider adding Novolog 4 units tid with meals (meal coverage- hold if patient eats less than 50% or NPO).  ? ? ?Thanks, ?Bronson Curb, MSN, RNC-OB ?Diabetes Coordinator ?[phone] (8a-5p) ? ?

## 2022-02-09 NOTE — Progress Notes (Signed)
ANTICOAGULATION CONSULT NOTE ? ?Pharmacy Consult for warfarin/heparin ? ?Indication: atrial fibrillation and Aortic mechanical valve ? ?Allergies  ?Allergen Reactions  ? Flagyl [Metronidazole] Rash  ?  ALL-OVER BODY RASH  ? Ciprofloxacin   ?  Other reaction(s): Stomach upset - tolerates ?Other reaction(s): Stomach upset - tolerates  ? Coreg [Carvedilol] Other (See Comments)  ?  Terrible cramping in the feet and had a lot of bowel movements, but not diarrhea  ? Diflucan [Fluconazole] Diarrhea  ? Losartan Swelling  ?  Patient doesn't recall site of swelling  ? Privigen [Immune Globulin (Human)] Other (See Comments)  ?  Severe/excruciating pain  ? Sulfamethoxazole-Trimethoprim   ?  Other reaction(s): stomach upset .  ? Verapamil Hives  ? Zetia [Ezetimibe] Other (See Comments)  ?  Reaction not recalled  ? Zocor [Simvastatin - High Dose] Other (See Comments)  ?  Reaction not recalled  ? Gatifloxacin Rash  ?  Redness to skin around eye  ? ? ?Patient Measurements: ?Height: '4\' 11"'$  (149.9 cm) ?Weight: 63.6 kg (140 lb 3.4 oz) ?IBW/kg (Calculated) : 43.2 ? ? ?Vital Signs: ?Temp: 98.1 ?F (36.7 ?C) (05/04 1761) ?Temp Source: Oral (05/04 6073) ?BP: 138/42 (05/04 0521) ?Pulse Rate: 78 (05/04 0521) ? ?Labs: ?Recent Labs  ?  02/07/22 ?0422 02/08/22 ?0602 02/08/22 ?2043 02/09/22 ?0238  ?HGB 6.2* 7.4*  --  7.0*  ?HCT 18.1* 21.3*  --  20.8*  ?PLT 80* 85*  --  89*  ?LABPROT 19.9* 20.4*  --  26.3*  ?INR 1.7* 1.8*  --  2.4*  ?HEPARINUNFRC  --   --  0.44 0.66  ?CREATININE 2.79* 2.48*  --  2.86*  ? ? ? ?Estimated Creatinine Clearance: 12.1 mL/min (A) (by C-G formula based on SCr of 2.86 mg/dL (H)). ? ? ?Medical History: ?Past Medical History:  ?Diagnosis Date  ? Aortic atherosclerosis ([place]) 01/23/2018  ? Atrial fibrillation ([place])   ? Carotid artery disease ([place])   ? Cholelithiasis 01/23/2018  ? Chronic anticoagulation   ? Coronary artery disease   ? status post coronary artery bypass grafting times 07/10/2004  ? Diabetes mellitus   ?  Hypercholesteremia   ? Hypertension   ? Mechanical heart valve present   ? H. aortic valve replacement at the time of bypass surgery October 2005  ? Moderate to severe pulmonary hypertension (HCC)   ? Peripheral arterial disease ([place])   ? history of left common iliac artery PTA and stenting for a chronic total occlusion 08/26/01  ? ? ?Assessment: ?81 yo female admitted with multifactorial anemia who is on warfarin PTA for afib and an Aortic Mechanical valve replacement. Last seen in warfarin clinic on 4/27 with INR of 3 and dose decreased to '5mg'$  daily except 7.'5mg'$  on Sunday and Thursday. Patient INR on admission 4/29 was 4.1 (goal per clinic documentation is 2-3).  ? ?Pharmacy consulted to restart warfarin when appropriate. Patient admitted for symptomatic anemia and transfused.  ? ?INR is therapeutic today at 2.4 (up from 1.8 on 5/3), after dose held on 4/29, 4/30, and 5/1. ? ?5/3: Hgb 7 this morning  ? ?4/30 PRBC x 1 ?4/30 Feraheme x 1 ?5/1 Epogen 40,000 units ?5/2 PRBC x 1 ? ?PTA dose warfarin 5 mg daily, except 7.5 mg on Sunday and Thursday ? ?As INR now back to therapeutic,  will d/c heparin drip and give lower dose of warfarin.  ?Goal of Therapy:  ? ?INR 2-3 ?Monitor platelets by anticoagulation protocol: Yes ?  ?Plan:  ?D/C Heparin drip ?  Give warfarin 5 mg po x 1 ?Monitor daily INR, CBC, clinical course, s/sx of bleed, PO intake/diet, Drug-Drug Interactions ? ? ?Tore Carreker A. Levada Dy, PharmD, BCPS, FNKF ?Clinical Pharmacist ?[place] ?Please utilize Amion for appropriate phone number to reach the unit pharmacist ([place]) ? ? ? ? ? ? ?

## 2022-02-09 NOTE — Progress Notes (Addendum)
HEMATOLOGY-ONCOLOGY PROGRESS NOTE ? ?ASSESSMENT AND PLAN: ? ?This is a very pleasant 81 year old female patient with multifactorial anemia, previously treated with rituximab for ANCA positive hemolytic anemia, previously on maintenance rituximab every 12 weeks presented with worsening anemia thought to be related to her hemolysis.   ? ?She hasnt responded very well to rituximab so far. She continues on prednisone 30 mg po daily. ?When we tried to taper prednisone, she relapsed with worsening hemolysis. ?We attempted IVIG in patient, she had infusion reaction, hence will not be able to re challenge this. ?Hemoglobin today is down to 7.0 recommend PRBC transfusion today. ?If she continues to have no response, we will add other immunosuppressants like mycophenolate mofetil or cyclophosphamide outpatient. ?Recheck labs in a.m. including CBC and reticulocytes. ?Ok to DC home tomorrow if she remains stable. ?Outpatient follow-up has been scheduled in our office on 02/13/2022. ? ?Mikey Bussing, DNP, AGPCNP-BC, AOCNP ? ?SUBJECTIVE: [name] does not feel well overall today.  No specific complaints.  She has ongoing lower extremity edema which she thinks is improved.  Still has some fatigue.  No bleeding reported. ? ?I have reviewed the past medical history, past surgical history, social history and family history with the patient and they are unchanged from previous note. ? ? ?PHYSICAL EXAMINATION: ?ECOG PERFORMANCE STATUS: 2 - Symptomatic, <50% confined to bed ? ?Vitals:  ? 02/08/22 2055 02/09/22 0521  ?BP: (!) 160/42 (!) 138/42  ?Pulse: 73 78  ?Resp: 18 17  ?Temp: 98.4 ?F (36.9 ?C) 98.1 ?F (36.7 ?C)  ?SpO2: 98% 98%  ? ?Filed Weights  ? 02/04/22 2248 02/09/22 0520  ?Weight: 63.3 kg 63.6 kg  ? ? ?Intake/Output from previous day: ?05/03 0701 - 05/04 0700 ?In: 510.7 [P.O.:480; I.V.:30.7] ?Out: 1200 [Urine:1200] ? ?Physical Exam ?Vitals reviewed.  ?Constitutional:   ?   General: She is not in acute distress. ?HENT:  ?   Head:  Normocephalic.  ?Eyes:  ?   General: No scleral icterus. ?   Conjunctiva/sclera: Conjunctivae normal.  ?Cardiovascular:  ?   Rate and Rhythm: Normal rate and regular rhythm.  ?Musculoskeletal:  ?   Right lower leg: Edema present.  ?   Left lower leg: Edema present.  ?Skin: ?   General: Skin is warm and dry.  ?Neurological:  ?   Mental Status: She is alert and oriented to person, place, and time.  ? ? ?LABORATORY DATA:  ?I have reviewed the data as listed ? ?  Latest Ref Rng & Units 02/09/2022  ?  2:38 AM 02/08/2022  ?  6:02 AM 02/07/2022  ?  4:22 AM  ?CMP  ?Glucose 70 - 99 mg/dL 272   141   219    ?BUN 8 - 23 mg/dL 125   135   144    ?Creatinine 0.44 - 1.00 mg/dL 2.86   2.48   2.79    ?Sodium 135 - 145 mmol/L 134   135   134    ?Potassium 3.5 - 5.1 mmol/L 4.4   4.4   5.3    ?Chloride 98 - 111 mmol/L 106   105   105    ?CO2 22 - 32 mmol/L '20   21   19    '$ ?Calcium 8.9 - 10.3 mg/dL 8.0   8.2   8.3    ? ? ?Lab Results  ?Component Value Date  ? WBC 4.1 02/09/2022  ? HGB 7.0 (L) 02/09/2022  ? HCT 20.8 (L) 02/09/2022  ? MCV 95.9 02/09/2022  ?  PLT 89 (L) 02/09/2022  ? NEUTROABS 7.1 02/02/2022  ? ? ?No results found for: CEA1, CEA, [id number], CA125, PSA1 ? ?DG Chest 2 View ? ?Result Date: 02/04/2022 ?CLINICAL DATA:  Chest pain EXAM: CHEST - 2 VIEW COMPARISON:  02/02/2022 FINDINGS: Cardiac enlargement. Aortic atherosclerosis. Previous median sternotomy and CABG procedure. Mild diffuse increase interstitial markings. Blunting of the costophrenic angles compatible with small pleural effusions. Calcified granulomas noted in the left base and right midlung. No airspace opacities. Curvature of the thoracic spine is convex towards the right. IMPRESSION: Cardiac enlargement, small pleural effusions and trace interstitial edema concerning for mild CHF. Electronically Signed   By: Kerby Moors M.D.   On: 02/04/2022 17:21  ? ?DG Abd 1 View ? ?Result Date: 02/04/2022 ?CLINICAL DATA:  Shortness of breath. EXAM: ABDOMEN - 1 VIEW COMPARISON:  None.  FINDINGS: A 1.2 cm calcified lung nodule is seen within the lateral aspect of the left lung base. The bowel gas pattern is normal. A mild amount of stool is seen throughout the colon. Radiopaque surgical clips are seen within the right upper quadrant. A radiopaque stent is seen within the left common iliac artery. Extensive calcification of the right common iliac artery is also noted. No radio-opaque calculi or other significant radiographic abnormality are seen. IMPRESSION: 1. Mild stool burden, without evidence of bowel obstruction. 2. Evidence of prior cholecystectomy. 3. Left common iliac artery stent. Electronically Signed   By: Virgina Norfolk M.D.   On: 02/04/2022 21:53  ? ?DG Chest Portable 1 View ? ?Result Date: 02/02/2022 ?CLINICAL DATA:  Shortness of breath EXAM: PORTABLE CHEST 1 VIEW COMPARISON:  12/15/2021 FINDINGS: Stable cardiomediastinal contours status post CABG. Unchanged calcified granulomas. No focal airspace consolidation, pleural effusion, or pneumothorax. Left glenohumeral joint osteoarthritis. IMPRESSION: No active disease. Electronically Signed   By: Davina Poke D.O.   On: 02/02/2022 11:33   ? ? ?Future Appointments  ?Date Time Provider [place]  ?02/17/2022 10:30 AM Jon Billings, RN [facility] None  ?03/07/2022 11:00 AM [facility] 1 - [facility]  ?03/07/2022 11:40 AM Marty Heck, MD [facility]  ?03/08/2022  9:45 AM Marzetta Board, DPM [facility]  ?  ? ? LOS: 4 days  ? ?I have had a long discussion about goals of life today with patient. She is agreeable to palliative but wants to try more options for treatment. She will see me back on Monday and we will do further work up and discuss about next line of options. BMB may be useful too. ?We will arrange for follow up, labs and infusion twice a week. ? ?  ?Attending Note ? ?I personally saw the patient, reviewed the chart and examined the patient. The plan of care was discussed with the patient and the  family. I agree with the assessment and plan as documented above. Thank you very much for the consultation. ? ? ?

## 2022-02-10 ENCOUNTER — Telehealth: Payer: Self-pay | Admitting: Physical Medicine and Rehabilitation

## 2022-02-10 ENCOUNTER — Inpatient Hospital Stay: Payer: Medicare HMO

## 2022-02-10 DIAGNOSIS — D649 Anemia, unspecified: Secondary | ICD-10-CM | POA: Diagnosis not present

## 2022-02-10 LAB — BASIC METABOLIC PANEL
Anion gap: 8 (ref 5–15)
BUN: 114 mg/dL — ABNORMAL HIGH (ref 8–23)
CO2: 22 mmol/L (ref 22–32)
Calcium: 8 mg/dL — ABNORMAL LOW (ref 8.9–10.3)
Chloride: 105 mmol/L (ref 98–111)
Creatinine, Ser: 2.45 mg/dL — ABNORMAL HIGH (ref 0.44–1.00)
GFR, Estimated: 19 mL/min — ABNORMAL LOW (ref >60)
Glucose, Bld: 101 mg/dL — ABNORMAL HIGH (ref 70–99)
Potassium: 3.9 mmol/L (ref 3.5–5.1)
Sodium: 135 mmol/L (ref 135–145)

## 2022-02-10 LAB — TYPE AND SCREEN
ABO/RH(D): A POS
Antibody Screen: NEGATIVE
Unit division: 0

## 2022-02-10 LAB — GLUCOSE, CAPILLARY: Glucose-Capillary: 80 mg/dL (ref 70–99)

## 2022-02-10 LAB — RETICULOCYTES
Immature Retic Fract: 31.5 % — ABNORMAL HIGH (ref 2.3–15.9)
RBC.: 2.82 MIL/uL — ABNORMAL LOW (ref 3.87–5.11)
Retic Count, Absolute: 215.7 10*3/uL — ABNORMAL HIGH (ref 19.0–186.0)
Retic Ct Pct: 7.7 % — ABNORMAL HIGH (ref 0.4–3.1)

## 2022-02-10 LAB — CBC
HCT: 26.5 % — ABNORMAL LOW (ref 36.0–46.0)
Hemoglobin: 9.1 g/dL — ABNORMAL LOW (ref 12.0–15.0)
MCH: 32.5 pg (ref 26.0–34.0)
MCHC: 34.3 g/dL (ref 30.0–36.0)
MCV: 94.6 fL (ref 80.0–100.0)
Platelets: 90 10*3/uL — ABNORMAL LOW (ref 150–400)
RBC: 2.8 MIL/uL — ABNORMAL LOW (ref 3.87–5.11)
RDW: 18.2 % — ABNORMAL HIGH (ref 11.5–15.5)
WBC: 3.6 10*3/uL — ABNORMAL LOW (ref 4.0–10.5)
nRBC: 4.5 % — ABNORMAL HIGH (ref 0.0–0.2)

## 2022-02-10 LAB — BPAM RBC
Blood Product Expiration Date: 202305272359
ISSUE DATE / TIME: 202305041438
Unit Type and Rh: 6200

## 2022-02-10 LAB — MAGNESIUM: Magnesium: 1.7 mg/dL (ref 1.7–2.4)

## 2022-02-10 LAB — PROTIME-INR
INR: 2.7 — ABNORMAL HIGH (ref 0.8–1.2)
Prothrombin Time: 28.5 seconds — ABNORMAL HIGH (ref 11.4–15.2)

## 2022-02-10 LAB — PATHOLOGIST SMEAR REVIEW

## 2022-02-10 MED ORDER — PREDNISONE 10 MG PO TABS: 30.0000 mg | ORAL_TABLET | Freq: Every day | ORAL | 0 refills | Status: DC

## 2022-02-10 NOTE — Progress Notes (Signed)
DISCHARGE NOTE HOME ?[name] to be discharged Home per MD order. Discussed prescriptions and follow up appointments with the patient. Prescriptions given to patient; medication list explained in detail. Patient verbalized understanding. ? ?Skin clean, dry and intact without evidence of skin break down, no evidence of skin tears noted. IV catheter discontinued intact. Site without signs and symptoms of complications. Dressing and pressure applied. Pt denies pain at the site currently. No complaints noted. ? ?Patient free of lines, drains, and wounds.  ? ?An After Visit Summary (AVS) was printed and given to the patient. ?Patient escorted via wheelchair, and discharged home via private auto. ? ?Arlyss Repress, RN  ?

## 2022-02-10 NOTE — Discharge Summary (Signed)
PatientPhysician Discharge Summary  ?ENVY MENO IEP:[id number] DOB: 1941/09/05 DOA: 02/04/2022 ? ?PCP: [name], MD ? ?Admit date: 02/04/2022 ?Discharge date: 02/10/2022 ?30 Day Unplanned Readmission Risk Score   ? ?Flowsheet Row ED to Hosp-Admission (Current) from 02/04/2022 in [facility]  ?30 Day Unplanned Readmission Risk Score (%) 41.99 Filed at 02/10/2022 0801  ? ?  ? ? This score is the patient's risk of an unplanned readmission within 30 days of being discharged (0 -100%). The score is based on dignosis, age, lab data, medications, orders, and past utilization.   ?Low:  0-14.9   Medium: 15-21.9   High: 22-29.9   Extreme: 30 and above ? ?  ? ?  ? ? ? ?Admitted From: Home ?Disposition: Home ? ?Recommendations for Outpatient Follow-up:  ?Follow up with PCP in 1-2 weeks ?Please obtain BMP/CBC in one week ?Follow-up with Dr. Chryl Heck of oncology on Monday. ?Please follow up with your PCP on the following pending results: ?Unresulted Labs (From admission, onward)  ? ?  Start     Ordered  ? 02/10/22 0500  ANA, IFA (with reflex)  Tomorrow morning,   R       ?Question:  Specimen collection method  Answer:  Lab=Lab collect  ? 02/09/22 2100  ? 02/10/22 0500  ANCA titers  Tomorrow morning,   R       ?Question:  Specimen collection method  Answer:  Lab=Lab collect  ? 02/09/22 2100  ? 02/09/22 0238  Pathologist smear review  Once,   R       ? 02/09/22 0238  ? 02/08/22 0500  Protime-INR  Daily,   R     ?Question:  Specimen collection method  Answer:  Lab=Lab collect  ? 02/07/22 0920  ? Unscheduled  Occult blood card to lab, stool RN will collect  As needed,   R     ?Question:  Specimen to be collected by:  Answer:  RN will collect  ? 02/04/22 1934  ? ?  ?  ? ?  ?  ? ? ?Home Health: None ?Equipment/Devices: None ? ?Discharge Condition: Stable ?CODE STATUS: Full code ?Diet recommendation: Cardiac ? ?Subjective: Seen and examined.  She has no complaints.  She is excited to know that her hemoglobin is 9.1  and she is ready to go home. ? ?Brief/Interim Summary: 81 y.o. female with medical history significant of multifactorial anemia, A-fib on Coumadin, CAD, status post CABG, status post mechanical aortic valve replacement, severe pulmonary hypertension, vasculitis, CKD stage IV, GERD, HLD HTN, PAD, diabetes type 2, chronic diastolic CHF presented with worsening shortness of breath.  She was found to be more anemic than usual with a hemoglobin of 6.5 with worsening renal function compared to her baseline.  She was hospitalized.  Detailed hospitalization as below. ? ?Symptomatic anemia/history of autoimmune hemolytic anemia ?-Follows up with Dr. Iruku/hematology as an outpatient: Was on oral prednisone and injectable rituximab as an outpatient ?-Presented with hemoglobin of 6.5.  Status post 3 units of packed red cells transfusion since hospitalization; received 1 unit on 02/07/2022.   Transfuse 1 more unit of packed red cells 02/09/2022.[name]  Prednisone dose increased to 30 mg daily by hematology.  Hemoglobin 9.1.  Per oncology, she is cleared for discharge.  Patient is also agreeable with that.  Oncology will see her on Monday as outpatient. ?  ?Thrombocytopenia, chronic ?-Stable.  Monitor ?  ?AKI on CKD stage IV ?-Baseline creatinine of 2.5-2.9.  Presented with creatinine  of 3.1.  Creatinine 2.45 today.  Has been getting IV Lasix for the last 3 days.  Resume home Lasix. ? ?Hyperkalemia ?-Improved.  Received a dose of Lokelma on 02/07/2022 ?  ?Paroxysmal A-fib ?History of mechanical aortic valve requiring chronic anticoagulation ?-Rate controlled.  Continue metoprolol.  Was getting Coumadin dose as per pharmacy.  Resume home dose of Coumadin.  INR therapeutic. ? ?CAD ?Hyperlipidemia ?Hypertension ?-Currently stable.  Continue metoprolol and statin.  Continue amlodipine and clonidine ?  ?Diabetes mellitus type II with hyperglycemia ?-A1c 6.6.  Resume home medications. ? ?Moderate to severe pulmonary hypertension ?-Continue  tadalafil ?  ?Physical deconditioning ?-PT recommends home health PT which has been ordered for her. ?  ?Moderate malnutrition ?-Follow nutrition recommendations ?  ?Hyponatremia ?-Mild.  Monitor ?  ?Goals of care ?-Overall prognosis is guarded to poor.  Seen by palliative care.  Wants to be full code and her goal of care is to return home and maintain independence as long as possible.  They recommended outpatient palliative care. ?  ? ?Discharge plan was discussed with patient and/or family member and they verbalized understanding and agreed with it.  ?Discharge Diagnoses:  ?Principal Problem: ?  Symptomatic anemia ?Active Problems: ?  Autoimmune hemolytic anemia (HCC) ?  Hypertension ?  Hypercholesteremia ?  Hyponatremia ?  Mechanical heart valve present ?  Coronary artery disease ?  Diabetes mellitus without complication ([place]) ?  GERD (gastroesophageal reflux disease) ?  Moderate to severe pulmonary hypertension (HCC) ?  PAF (paroxysmal atrial fibrillation) ([place]) ?  CKD (chronic kidney disease), stage IV ([place]) ?  Thrombophilia ([place]) ?  Hyperkalemia ?  Acute renal failure superimposed on stage 3b chronic kidney disease ([place]) ? ? ? ?Discharge Instructions ? ? ?Allergies as of 02/10/2022   ? ?   Reactions  ? Flagyl [metronidazole] Rash  ? ALL-OVER BODY RASH  ? Ciprofloxacin   ? Other reaction(s): Stomach upset - tolerates ?Other reaction(s): Stomach upset - tolerates  ? Coreg [carvedilol] Other (See Comments)  ? Terrible cramping in the feet and had a lot of bowel movements, but not diarrhea  ? Diflucan [fluconazole] Diarrhea  ? Losartan Swelling  ? Patient doesn't recall site of swelling  ? Privigen [immune Globulin (human)] Other (See Comments)  ? Severe/excruciating pain  ? Sulfamethoxazole-trimethoprim   ? Other reaction(s): stomach upset .  ? Verapamil Hives  ? Zetia [ezetimibe] Other (See Comments)  ? Reaction not recalled  ? Zocor [simvastatin - High Dose] Other (See Comments)  ? Reaction not recalled  ?  Gatifloxacin Rash  ? Redness to skin around eye  ? ?  ? ?  ?Medication List  ?  ? ?STOP taking these medications   ? ?fluconazole 100 MG tablet ?Commonly known as: DIFLUCAN ?  ? ?  ? ?TAKE these medications   ? ?acetaminophen 325 MG tablet ?Commonly known as: TYLENOL ?Take 650 mg by mouth every 6 (six) hours as needed for mild pain. ?  ?alendronate 70 MG tablet ?Commonly known as: FOSAMAX ?Take 70 mg by mouth every Saturday. ?  ?allopurinol 100 MG tablet ?Commonly known as: ZYLOPRIM ?Take 100 mg by mouth daily. ?  ?ALPRAZolam 0.5 MG tablet ?Commonly known as: Duanne Moron ?Take 0.25-0.5 mg by mouth at bedtime. ?  ?amLODipine 10 MG tablet ?Commonly known as: NORVASC ?TAKE 1 TABLET EVERY DAY ?  ?atorvastatin 80 MG tablet ?Commonly known as: LIPITOR ?Take 1 tablet (80 mg total) by mouth daily at 6 PM. ?  ?cloNIDine 0.2 MG  tablet ?Commonly known as: CATAPRES ?TAKE 1 TABLET TWICE DAILY ?What changed: Another medication with the same name was removed. Continue taking this medication, and follow the directions you see here. ?  ?estradiol 0.1 MG/GM vaginal cream ?Commonly known as: ESTRACE ?Place 0.5g nightly for two weeks then twice a week after ?What changed:  ?how much to take ?how to take this ?when to take this ?  ?folic acid 1 MG tablet ?Commonly known as: FOLVITE ?Take 1 tablet (1 mg total) by mouth daily. ?  ?furosemide 40 MG tablet ?Commonly known as: LASIX ?TAKE 2 TABLETS (80 MG TOTAL) IN MORNING AND 1 AND 1/2 TABLETS (60 MG TOTAL) IN EVENING. CHANGE IN DOSAGE. ?  ?glipiZIDE 5 MG tablet ?Commonly known as: Glucotrol ?Take 1 tablet (5 mg total) by mouth daily. ?  ?hydrALAZINE 100 MG tablet ?Commonly known as: APRESOLINE ?TAKE 1 TABLET THREE TIMES DAILY ?  ?lactose free nutrition Liqd ?Take 237 mLs by mouth daily. (To supplement meals) ?  ?lidocaine 2 % solution ?Commonly known as: XYLOCAINE ?Use as directed 15 mLs in the mouth or throat as needed for mouth pain. ?  ?magnesium oxide 400 (241.3 Mg) MG tablet ?Commonly known  as: MAG-OX ?Take 1 tablet (400 mg total) by mouth daily. ?  ?metoprolol succinate 25 MG 24 hr tablet ?Commonly known as: TOPROL-XL ?TAKE 1/2 TABLET EVERY DAY ?  ?ondansetron 4 MG disintegrating tablet ?Commonl

## 2022-02-10 NOTE — Telephone Encounter (Signed)
Patient called. She would like an appointment with Dr. Ernestina Patches. Her call back number is [phone] ?

## 2022-02-10 NOTE — TOC Transition Note (Signed)
Transition of Care (TOC) - CM/SW Discharge Note ? ? ?Patient Details  ?Name: [name] ?MRN: [id number] ?Date of Birth: [birth year] ? ?Transition of Care (TOC) CM/SW Contact:  ?Tom-Johnson, Renea Ee, RN ?Phone Number: ?02/10/2022, 11:01 AM ? ? ?Clinical Narrative:    ? ?Patient is scheduled for discharge today. Home health PT/OT/RN/Aide resumption of care with [facility] and info on AVS. Denies any other needs. Family to transport at discharge. No further TOC needs noted. ? ?Final next level of care: [place] ?Barriers to Discharge: Barriers Resolved ? ? ?Patient Goals and CMS Choice ?Patient states their goals for this hospitalization and ongoing recovery are:: To return home ?CMS Medicare.gov Compare Post Acute Care list provided to:: Patient ?Choice offered to / list presented to : Patient ? ?Discharge Placement ?  ?           ?  ?Patient to be transferred to facility by: Family ?  ?  ? ?Discharge Plan and Services ?  ?  ?           ?DME Arranged: N/A ?DME Agency: NA ?  ?  ?  ?HH Arranged: PT, OT, RN ?[place] Agency: [place] ?Date HH Agency Contacted: 02/08/22 ?Time [place]: 6286 ?Representative spoke with at [place]: Marjory Lies ? ?Social Determinants of Health (SDOH) Interventions ?  ? ? ?Readmission Risk Interventions ?   ? View : No data to display.  ?  ?  ?  ? ? ? ? ? ?

## 2022-02-10 NOTE — Plan of Care (Signed)
?  Problem: Health Behavior/Discharge Planning: ?Goal: Ability to manage health-related needs will improve ?Outcome: Adequate for Discharge ?  ?Problem: Health Behavior/Discharge Planning: ?Goal: Ability to manage health-related needs will improve ?Outcome: Adequate for Discharge ?  ?Problem: Clinical Measurements: ?Goal: Ability to maintain clinical measurements within normal limits will improve ?Outcome: Adequate for Discharge ?  ?Problem: Activity: ?Goal: Risk for activity intolerance will decrease ?Outcome: Adequate for Discharge ?  ?Problem: Nutrition: ?Goal: Adequate nutrition will be maintained ?Outcome: Adequate for Discharge ?  ?Problem: Coping: ?Goal: Level of anxiety will decrease ?Outcome: Adequate for Discharge ?  ?Problem: Elimination: ?Goal: Will not experience complications related to bowel motility ?Outcome: Adequate for Discharge ?Goal: Will not experience complications related to urinary retention ?Outcome: Adequate for Discharge ?  ?Problem: Pain Managment: ?Goal: General experience of comfort will improve ?Outcome: Adequate for Discharge ?  ?Problem: Safety: ?Goal: Ability to remain free from injury will improve ?Outcome: Adequate for Discharge ?  ?Problem: Skin Integrity: ?Goal: Risk for impaired skin integrity will decrease ?Outcome: Adequate for Discharge ?  ?Problem: Acute Rehab OT Goals (only OT should resolve) ?Goal: Pt. Will Perform Grooming ?Outcome: Adequate for Discharge ?Goal: Pt. Will Perform Lower Body Dressing ?Outcome: Adequate for Discharge ?Goal: Pt. Will Transfer To Toilet ?Outcome: Adequate for Discharge ?Goal: OT Additional ADL Goal #1 ?Outcome: Adequate for Discharge ?  ?Problem: Acute Rehab PT Goals(only PT should resolve) ?Goal: Pt Will Ambulate ?Outcome: Adequate for Discharge ?Goal: Pt Will Go Up/Down Stairs ?Outcome: Adequate for Discharge ?  ?Problem: Malnutrition  (NI-5.2) ?Goal: Food and/or nutrient delivery ?Description: Individualized approach for food/nutrient  provision. ?Outcome: Adequate for Discharge ?  ?

## 2022-02-10 NOTE — Progress Notes (Signed)
Patient and family given discharge instructions and stated understanding. ?

## 2022-02-13 ENCOUNTER — Other Ambulatory Visit: Payer: Self-pay | Admitting: *Deleted

## 2022-02-13 ENCOUNTER — Other Ambulatory Visit: Payer: Self-pay

## 2022-02-13 ENCOUNTER — Inpatient Hospital Stay (HOSPITAL_BASED_OUTPATIENT_CLINIC_OR_DEPARTMENT_OTHER): Payer: Medicare HMO | Admitting: Hematology and Oncology

## 2022-02-13 ENCOUNTER — Inpatient Hospital Stay: Payer: Medicare HMO

## 2022-02-13 ENCOUNTER — Inpatient Hospital Stay: Payer: Medicare HMO | Attending: Oncology

## 2022-02-13 VITALS — BP 143/83 | HR 81 | Temp 97.7°F | Wt 136.1 lb

## 2022-02-13 DIAGNOSIS — R739 Hyperglycemia, unspecified: Secondary | ICD-10-CM | POA: Diagnosis not present

## 2022-02-13 DIAGNOSIS — D591 Autoimmune hemolytic anemia, unspecified: Secondary | ICD-10-CM

## 2022-02-13 DIAGNOSIS — D649 Anemia, unspecified: Secondary | ICD-10-CM

## 2022-02-13 DIAGNOSIS — R197 Diarrhea, unspecified: Secondary | ICD-10-CM | POA: Diagnosis not present

## 2022-02-13 DIAGNOSIS — I7782 Antineutrophilic cytoplasmic antibody (ANCA) vasculitis: Secondary | ICD-10-CM | POA: Diagnosis not present

## 2022-02-13 DIAGNOSIS — N1832 Chronic kidney disease, stage 3b: Secondary | ICD-10-CM | POA: Diagnosis not present

## 2022-02-13 DIAGNOSIS — R102 Pelvic and perineal pain: Secondary | ICD-10-CM | POA: Diagnosis not present

## 2022-02-13 DIAGNOSIS — M8580 Other specified disorders of bone density and structure, unspecified site: Secondary | ICD-10-CM | POA: Diagnosis not present

## 2022-02-13 DIAGNOSIS — N939 Abnormal uterine and vaginal bleeding, unspecified: Secondary | ICD-10-CM | POA: Diagnosis not present

## 2022-02-13 LAB — DIRECT ANTIGLOBULIN TEST (NOT AT ARMC)
DAT, IgG: NEGATIVE
DAT, complement: NEGATIVE

## 2022-02-13 LAB — RETIC PANEL
Immature Retic Fract: 16.1 % — ABNORMAL HIGH (ref 2.3–15.9)
RBC.: 3.13 MIL/uL — ABNORMAL LOW (ref 3.87–5.11)
Retic Count, Absolute: 263.5 10*3/uL — ABNORMAL HIGH (ref 19.0–186.0)
Retic Ct Pct: 8.4 % — ABNORMAL HIGH (ref 0.4–3.1)
Reticulocyte Hemoglobin: 34 pg (ref >27.9)

## 2022-02-13 LAB — CMP (CANCER CENTER ONLY)
ALT: 30 U/L (ref 0–44)
AST: 31 U/L (ref 15–41)
Albumin: 3.1 g/dL — ABNORMAL LOW (ref 3.5–5.0)
Alkaline Phosphatase: 58 U/L (ref 38–126)
Anion gap: 8 (ref 5–15)
BUN: 96 mg/dL — ABNORMAL HIGH (ref 8–23)
CO2: 21 mmol/L — ABNORMAL LOW (ref 22–32)
Calcium: 8 mg/dL — ABNORMAL LOW (ref 8.9–10.3)
Chloride: 101 mmol/L (ref 98–111)
Creatinine: 2.42 mg/dL — ABNORMAL HIGH (ref 0.44–1.00)
GFR, Estimated: 19 mL/min — ABNORMAL LOW (ref >60)
Glucose, Bld: 312 mg/dL — ABNORMAL HIGH (ref 70–99)
Potassium: 4.6 mmol/L (ref 3.5–5.1)
Sodium: 130 mmol/L — ABNORMAL LOW (ref 135–145)
Total Bilirubin: 0.6 mg/dL (ref 0.3–1.2)
Total Protein: 5.4 g/dL — ABNORMAL LOW (ref 6.5–8.1)

## 2022-02-13 LAB — SAMPLE TO BLOOD BANK

## 2022-02-13 LAB — FANA STAINING PATTERNS: Speckled Pattern: 1:160 {titer} — ABNORMAL HIGH

## 2022-02-13 LAB — CBC WITH DIFFERENTIAL (CANCER CENTER ONLY)
Abs Immature Granulocytes: 0.01 10*3/uL (ref 0.00–0.07)
Basophils Absolute: 0 10*3/uL (ref 0.0–0.1)
Basophils Relative: 0 %
Eosinophils Absolute: 0 10*3/uL (ref 0.0–0.5)
Eosinophils Relative: 0 %
HCT: 30.5 % — ABNORMAL LOW (ref 36.0–46.0)
Hemoglobin: 10.1 g/dL — ABNORMAL LOW (ref 12.0–15.0)
Immature Granulocytes: 0 %
Lymphocytes Relative: 4 %
Lymphs Abs: 0.2 10*3/uL — ABNORMAL LOW (ref 0.7–4.0)
MCH: 32.1 pg (ref 26.0–34.0)
MCHC: 33.1 g/dL (ref 30.0–36.0)
MCV: 96.8 fL (ref 80.0–100.0)
Monocytes Absolute: 0.1 10*3/uL (ref 0.1–1.0)
Monocytes Relative: 2 %
Neutro Abs: 4.8 10*3/uL (ref 1.7–7.7)
Neutrophils Relative %: 94 %
Platelet Count: 102 10*3/uL — ABNORMAL LOW (ref 150–400)
RBC: 3.15 MIL/uL — ABNORMAL LOW (ref 3.87–5.11)
RDW: 19.7 % — ABNORMAL HIGH (ref 11.5–15.5)
WBC Count: 5.2 10*3/uL (ref 4.0–10.5)
nRBC: 0 % (ref 0.0–0.2)

## 2022-02-13 LAB — DIC (DISSEMINATED INTRAVASCULAR COAGULATION)PANEL
D-Dimer, Quant: <0.27 ug/mL-FEU (ref 0.00–0.50)
Fibrinogen: 505 mg/dL — ABNORMAL HIGH (ref 210–475)
INR: 2.9 — ABNORMAL HIGH (ref 0.8–1.2)
Platelets: 110 10*3/uL — ABNORMAL LOW (ref 150–400)
Prothrombin Time: 29.7 seconds — ABNORMAL HIGH (ref 11.4–15.2)
aPTT: 44 seconds — ABNORMAL HIGH (ref 24–36)

## 2022-02-13 LAB — ANTINUCLEAR ANTIBODIES, IFA: ANA Ab, IFA: POSITIVE — AB

## 2022-02-13 LAB — FERRITIN: Ferritin: 423 ng/mL — ABNORMAL HIGH (ref 11–307)

## 2022-02-13 LAB — LACTATE DEHYDROGENASE: LDH: 294 U/L — ABNORMAL HIGH (ref 98–192)

## 2022-02-13 NOTE — Patient Instructions (Addendum)
Patient Goals/Self-Care Activities: Anemia ?Take all medications as prescribed ?Attend all scheduled provider appointments ?Notify physician for any changes in condition such as shortness of breath and increased fatigue.   ?Pace self with activities ?Eat iron rich foods such as red meat, salmon, breakfast cereal with iron.   ? ?Patient Goals/Self-Care Activities: Heart Failure and Diabetes ?Take all medications as prescribed ?Attend all scheduled provider appointments ?call office if I gain more than 2 pounds in one day or 5 pounds in one week ?track weight in diary ?use salt in moderation ?watch for swelling in feet, ankles and legs every day ?weigh myself daily ?follow rescue plan if symptoms flare-up ?check blood sugar at prescribed times: twice daily ?enter blood sugar readings and medication or insulin into daily log ?take the blood sugar log to all doctor visits ?manage portion size ?Limit food high in carbohydrates such as rice, bread, potatoes, pasta ? ?

## 2022-02-13 NOTE — Telephone Encounter (Signed)
Tried calling patient as well about obtaining additional information. Will wait for return call.  ?

## 2022-02-13 NOTE — Telephone Encounter (Signed)
[place] for this? ?Looks like she had prior injection ?

## 2022-02-13 NOTE — Patient Outreach (Signed)
?[facility]) Care Management ?Telephonic RN Care Manager Note ? ? ?02/13/2022 ?Name:  [name] MRN:  [id number] DOB:  08-10-1941 ? ?Summary: ?Telephone call to patient for hospital follow up. Patient doing ok. Patient reports follow up appointment today with oncology for anemia.  Physician goal to keep hemoglobin greater than 7.  Rituximab infusion weekly for now.  Patient reports some weakness but pushing through.  Patient desire to maintain her independence and stay in her home.  Patient able to bath and dress self and fix simple meals.  Family is local and will assist as needed.  Home Health ordered post hospitalization with first visit on Saturday.   ? ? ?Subjective: ?[name] is an 81 y.o. year old female who is a primary patient of [name], [name], MD. The care management team was consulted for assistance with care management and/or care coordination needs.   ? ?Telephonic RN Care Manager completed Telephone Visit today. ? ?Objective:  ? ?Medications Reviewed Today   ? ? Reviewed by Jon Billings, RN (Case Manager) on 02/13/22 at 1014  Med List Status: <None>  ? ?Medication Order Taking? Sig Documenting Provider Last Dose Status Informant  ?acetaminophen (TYLENOL) 325 MG tablet [id number] Yes Take 650 mg by mouth every 6 (six) hours as needed for mild pain.  [provider] Taking Active Self  ?alendronate (FOSAMAX) 70 MG tablet [id number] Yes Take 70 mg by mouth every Saturday. [provider] Taking Active Self  ?allopurinol (ZYLOPRIM) 100 MG tablet [id number] Yes Take 100 mg by mouth daily. [provider] Taking Active Self  ?ALPRAZolam (XANAX) 0.5 MG tablet [id number] Yes Take 0.25-0.5 mg by mouth at bedtime. [provider] Taking Active Self  ?amLODipine (NORVASC) 10 MG tablet [id number] Yes TAKE 1 TABLET EVERY DAY  ?Patient taking differently: Take 10 mg by mouth daily.  ? [name], MD Taking Active Self  ?atorvastatin (LIPITOR) 80  MG tablet [id number] Yes Take 1 tablet (80 mg total) by mouth daily at 6 PM. [name], MD Taking Active Self  ?Blood Glucose Monitoring Suppl (TRUE METRIX METER) w/Device KIT [id number] Yes  [provider] Taking Active Self  ?Cholecalciferol (VITAMIN D3) 50 MCG (2000 UT) capsule [id number] Yes Take 2,000 Units by mouth daily with lunch. [provider] Taking Active Self  ?cloNIDine (CATAPRES) 0.2 MG tablet [id number] Yes TAKE 1 TABLET TWICE DAILY  ?Patient taking differently: Take 0.2 mg by mouth 2 (two) times daily.  ? [name], MD Taking Active Self  ?Cyanocobalamin (VITAMIN B12) 1000 MCG TBCR [id number] Yes Take 1,000 mcg by mouth daily with lunch. [provider] Taking Active Self  ?estradiol (ESTRACE) 0.1 MG/GM vaginal cream [id number] Yes Place 0.5g nightly for two weeks then twice a week after  ?Patient taking differently: Place 1 Applicatorful vaginally 3 (three) times a week. Place 0.5g nightly for two weeks then twice a week after  ? Jaquita Folds, MD Taking Active Self, Child  ?         ?Med Note [place] Crane, MISTY D   Sat Feb 04, 2022  9:24 PM) Pt has not used since she has been sick  ?folic acid (FOLVITE) 1 MG tablet [id number] Yes Take 1 tablet (1 mg total) by mouth daily. Terrilee Croak, MD Taking Active Self  ?furosemide (LASIX) 40 MG tablet [id number] Yes TAKE 2 TABLETS (80 MG TOTAL) IN MORNING AND 1 AND 1/2 TABLETS (60 MG TOTAL) IN EVENING. CHANGE IN DOSAGE. Clegg, Amy D,  NP Taking Active Self  ?glipiZIDE (GLUCOTROL) 5 MG tablet [id number] Yes Take 1 tablet (5 mg total) by mouth daily. Benay Pike, MD Taking Active Self  ?glucose blood (TRUE METRIX BLOOD GLUCOSE TEST) test strip [id number] Yes Use 1-2 times daily [provider] Taking Active Self  ?hydrALAZINE (APRESOLINE) 100 MG tablet [id number] Yes TAKE 1 TABLET THREE TIMES DAILY  ?Patient taking differently: Take 100 mg by mouth 3 (three) times daily.  ? [name], MD Taking Active Self   ?lactose free nutrition (BOOST) LIQD [id number] Yes Take 237 mLs by mouth daily. (To supplement meals)  [provider] Taking Active Self  ?lidocaine (XYLOCAINE) 2 % solution [id number] Yes Use as directed 15 mLs in the mouth or throat as needed for mouth pain. Benay Pike, MD Taking Active Self  ?magnesium oxide (MAG-OX) 400 (241.3 Mg) MG tablet [id number] Yes Take 1 tablet (400 mg total) by mouth daily. [name], MD Taking Active Self  ?         ?Med Note [name] Jul 07, 2019  3:42 AM)    ?metoprolol succinate (TOPROL-XL) 25 MG 24 hr tablet [id number] Yes TAKE 1/2 TABLET EVERY DAY  ?Patient taking differently: Take 12.5 mg by mouth daily.  ? [name], MD Taking Active Self  ?ondansetron (ZOFRAN-ODT) 4 MG disintegrating tablet [id number] Yes Take 4 mg by mouth every 8 (eight) hours as needed for vomiting or nausea. [provider] Taking Active Self  ?pantoprazole (PROTONIX) 40 MG tablet [id number] Yes Take 40 mg by mouth 2 (two) times daily.  [provider] Taking Active Self  ?         ?Med Note [name]   Wed Jun 20, 2017 10:33 AM)    ?predniSONE (DELTASONE) 10 MG tablet [id number] Yes Take 3 tablets (30 mg total) by mouth daily with breakfast. Darliss Cheney, MD Taking Active   ?spironolactone (ALDACTONE) 25 MG tablet [id number] Yes TAKE 1 TABLET EVERY DAY  ?Patient taking differently: Take 25 mg by mouth daily.  ? [name], MD Taking Active Self  ?tadalafil, PAH, (ADCIRCA) 20 MG tablet [id number] Yes Take 2 tablets (40 mg total) by mouth daily.  ?Patient taking differently: Take 40 mg by mouth at bedtime.  ? [name], MD Taking Active Self  ?TRUE METRIX BLOOD GLUCOSE TEST test strip [id number] Yes SMARTSIG:Via Meter [provider] Taking Active Self  ?valACYclovir (VALTREX) 500 MG tablet [id number] Yes Take 1 tablet (500 mg total) by mouth 2 (two) times daily. Benay Pike, MD Taking Active Self  ?vitamin C  (ASCORBIC ACID) 500 MG tablet [id number] Yes Take 500 mg by mouth daily with lunch. [provider] Taking Active Self  ?vitamin C (ASCORBIC ACID) 500 MG tablet [id number]  1 tablet  ?Patient not taking: Reported on 02/04/2022  ? [provider]  Active Self  ?warfarin (COUMADIN) 5 MG tablet [id number] Yes Take 1 to 1 &1/2 tablets by mouth daily as directed by the coumadin clinic  ?Patient taking differently: Take 5-7.5 mg by mouth See admin instructions. 5 mg on Monday,Wednesday,Thursday,Friday,Saturday ?7.5 mg on Tuesday and sunday  ? Lorretta Harp, MD Taking Active Self  ? ?  ?  ? ?  ? ? ? ?SDOH:  (Social Determinants of Health) assessments and interventions performed:  ? ? ? ?Care Plan ? ?Review of patient past medical history, allergies, medications, health status, including review of consultants reports, laboratory and other test data,  was performed as part of comprehensive evaluation for care management services.  ? ?Care Plan : RN CM Plan of Care  ?Updates made by Jon Billings, RN since 02/13/2022 12:00 AM  ?  ? ?Problem: Chronic disease management and care coordination needs of HF, DM, and Anemia   ?Priority: High  ?  ? ?Long-Range Goal: Development of plan of care for management of HF and DM   ?Start Date: 01/13/2022  ?Expected End Date: 10/08/2022  ?This Visit's Progress: On track  ?Recent Progress: On track  ?Priority: High  ?Note:   ?Current Barriers:  ?Chronic Disease Management support and education needs related to CHF and DMII  ? ?RNCM Clinical Goal(s):  ?Patient will verbalize basic understanding of  CHF and DMII disease process and self health management plan as evidenced by No acute exacerbation of heart failure and blood sugars less than 300 ?continue to work with RN Care Manager to address care management and care coordination needs related to  CHF and DMII as evidenced by adherence to CM Team Scheduled appointments through collaboration with RN Care manager, provider, and care  team.  ? ?Interventions: ?Education and support related to heart failure and diabetes ?Inter-disciplinary care team collaboration (see longitudinal plan of care) ?Evaluation of current treatment plan related to

## 2022-02-14 ENCOUNTER — Encounter: Payer: Self-pay | Admitting: Hematology and Oncology

## 2022-02-14 ENCOUNTER — Telehealth: Payer: Self-pay

## 2022-02-14 ENCOUNTER — Other Ambulatory Visit (HOSPITAL_COMMUNITY): Payer: Self-pay

## 2022-02-14 ENCOUNTER — Other Ambulatory Visit: Payer: Self-pay | Admitting: Hematology and Oncology

## 2022-02-14 LAB — C3 AND C4
C3 Complement: 114 mg/dL (ref 82–167)
Complement C4, Body Fluid: 25 mg/dL (ref 12–38)

## 2022-02-14 LAB — ANCA TITERS
Atypical P-ANCA titer: <1:20 {titer}
C-ANCA: <1:20 {titer}
P-ANCA: <1:20 {titer}

## 2022-02-14 MED ORDER — MYCOPHENOLATE MOFETIL 500 MG PO TABS
500.0000 mg | ORAL_TABLET | Freq: Every day | ORAL | 1 refills | Status: DC
  Filled 2022-02-14: qty 30, 30d supply, fill #0
  Filled 2022-03-03: qty 30, 30d supply, fill #1

## 2022-02-14 NOTE — Progress Notes (Signed)
[facility]: ?  ? ?[name], [name], MD ?[address] ?[place] [address] ? ?DIAGNOSIS:  ? ?SUMMARY OF HEMATOLOGIC HISTORY: ?81 y.o. [place] woman with multifactorial anemia, as follows: ?            (a) anemia of chronic inflammation: As of May 2020 she has a SED rate of 95, CRP 1.1, positive ANA and RF; subsequently she was found to be ANCA positive ?            (b) hemolysis (mild): she has a mildly elevated LDH and a DAT positive for complement, not IgG; this is c/w (a) above ?            (c) anemia of renal insufficiency: inadequate EPO resonse to anemia    ?                     (d) GIB/ iron deficiency in patient on lifelong anticoagulaton ?  ?(1) Feraheme received 03/01/2019, repeated 03/20/2019  ?            (a) reticulocyte 03/07/2019 up from 43.4 a year ago to 191.8 after iron infusion ?            (b) feraheme repeated on 05/12/2019 ?            (c) subsequent ferritin levels >100 ?  ?(2) darbepoetin: starting 05/05/2019 at 137mg given every 2 weeks ?            (a) dose increased to 200 mcg every week starting 07/01/2019  ?            (b) back to every 2 weeks beginning 08/04/2019 still at 200 mcg dose ?            (c) switched to Retacrit every 4 weeks as of 08/18/2019 ?  ?(3) ANCA positive vasculitis: diagnosed 05/08/2019 (titer 1:640) ?            (a) prednisone 40 mg daily started 05/31/2019 ?            (b) rituximab weekly started 06/12/2019 (received test dose 06/05/2019) ?                        (i) hepatitis B sAg, core Ab and HIV negative 05/08/2019 ?                        (ii) rituximab held after 07/14/2019 dose (received 5 weekly doses) with neutropenia ?                        (iii) rituximab resumed 08/18/2019 but changed to monthly ?                        (iv) rituximab changed to every 2 months beginning with 02/02/2020 dose ?                        (v) rituximab changed to every 12 weeks after the 10/12/2020 dose ?            (c) prednisone  tapered to off as of 09/15/2019 ?  ?(4) osteopenia with a T score of -1.5 on bone density 02/28/2016 at the breast center ? ?CURRENT THERAPY: Rituximab ? ?INTERVAL HISTORY: ? ?[name] 81y.o. female returns for her follow-up of anemia. ? ?  Ms. Choy was on maintenance rituximab for autoimmune hemolytic anemia, ANCA positive.  She was diagnosed back in 2020, received prednisone, rituximab responded well to rituximab and stayed on rituximab maintenance.  She was initially getting it once every 4 weeks but this was slowly changed to every 2 months and then every 3 months.  She relapsed even before she got her second cycle of every 23-monthrituximab with the worsening anemia, multiple hospitalizations.  She was restarted on prednisone, rituximab.  But this time failed to respond to rituximab.  She also has been relapsing with prednisone taper.  She is currently on 30 mg, recently once again admitted with worsening anemia, required blood transfusion and she is here for follow-up.  She is agreeable to consider other form of immunosuppression.  While she was in the hospital, she was given IVIG and she had severe infusion reaction with intractable back pain, lower extremity pain and this resolved after discontinuation of the IVIG.  Today she continues to report fatigue, otherwise lower extremity swelling.  No new complaints. ?Rest of  the pertinent 10 point ROS reviewed and negative. ? ? ?Patient Active Problem List  ? Diagnosis Date Noted  ? Hyperkalemia 02/04/2022  ? Acute renal failure superimposed on stage 3b chronic kidney disease (HDowagiac 02/04/2022  ? Hyperglycemia 01/09/2022  ? Autoimmune hemolytic anemia (H[place] 01/02/2022  ? Bilateral lower extremity edema 01/02/2022  ? Cold sore 01/02/2022  ? Symptomatic anemia 12/15/2021  ? Abnormal chest x-ray 12/15/2021  ? Supratherapeutic INR 12/15/2021  ? Elevated troponin 12/15/2021  ? Unilateral primary osteoarthritis, right hip 05/31/2021  ? Midline cystocele 05/30/2021   ? Primary localized osteoarthritis of pelvic region and thigh 05/30/2021  ? Vaginal pain 05/30/2021  ? Asymmetric SNHL (sensorineural hearing loss) 01/06/2021  ? Referred otalgia of both ears 01/06/2021  ? Bilateral hearing loss 08/31/2020  ? Bilateral impacted cerumen 08/31/2020  ? Low back pain 02/20/2020  ? CKD (chronic kidney disease), stage IV (HWest [place] 10/13/2019  ? Leukopenia due to antineoplastic chemotherapy (HLake [place] 08/18/2019  ? Chemotherapy induced neutropenia (HD[place] 07/21/2019  ? Thrombophilia (HSat[place] 06/19/2019  ? Encounter for general adult medical examination without abnormal findings 06/13/2019  ? Arteritis (HPinetop Country Club 06/06/2019  ? Hypertensive heart and chronic kidney disease with heart failure and stage 1 through stage 4 chronic kidney disease, or unspecified chronic kidney disease (HClayville 06/06/2019  ? Muscle weakness 06/06/2019  ? Pancytopenia (HMcFarlan 05/28/2019  ? Dyspnea 05/28/2019  ? Vasculitis, ANCA positive 05/28/2019  ? Disorder of connective tissue (HOld Agency 05/14/2019  ? Renal insufficiency 05/08/2019  ? Acute on chronic diastolic CHF (congestive heart failure) (HPlandome Heights 04/07/2019  ? Left leg cellulitis 04/07/2019  ? PAF (paroxysmal atrial fibrillation) (HMonsey 04/07/2019  ? Iron deficiency anemia 02/28/2019  ? 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 (HNorth Crossett 02/28/2019  ? Diabetic retinopathy associated with type 2 diabetes mellitus (HLake [place] 09/02/2018  ? Chronic pain 06/19/2018  ? Non-thrombocytopenic purpura (HRapids City 05/21/2018  ? Gout 03/22/2018  ? Malnutrition of moderate degree 03/12/2018  ? Acute on chronic diastolic heart failure (HDak[place] 03/06/2018  ? Diabetes mellitus type 2, controlled (HH[place] 03/06/2018  ? S/P cholecystectomy 03/06/2018  ? HTN (hypertension) 03/06/2018  ? H/O mechanical aortic valve replacement 03/06/2018  ? Moderate to severe pulmonary hypertension (HBay City 03/06/2018  ? GERD (gastroesophageal reflux disease) 02/21/2018  ? Anxiety 02/21/2018  ? Chronic diastolic CHF (congestive  heart failure) (HLisbon 02/21/2018  ? Diarrhea 02/21/2018  ? General weakness 02/02/2018  ? Thrombocytopenia (HIndian [place] 01/25/2018  ? Arm pain, diffuse  01/25/2018  ? Anemia 01/23/2018  ? Cholelithiasis 01/23/2018  ? Aortic atherosclerosis ([place]) 01/23/2018  ? GI bleed 01/18/2018  ? Age-related osteoporosis without current pathological fracture 05/24/2017  ? Carotid artery occlusion 05/24/2017  ? Generalized anxiety disorder 05/24/2017  ? Hearing loss 05/24/2017  ? Presence of prosthetic heart valve 05/24/2017  ? Coronary artery disease 08/18/2013  ? Carotid artery disease ([place]) 08/18/2013  ? Peripheral arterial disease ([place]) 08/18/2013  ? Diabetes mellitus without complication ([place]) 44/92/0100  ? Benign positional vertigo 10/06/2011  ? Hyponatremia 10/06/2011  ? Hypertension   ? Hypercholesteremia   ? Mechanical heart valve present   ? Chronic anticoagulation   ? ? ?is allergic to flagyl [metronidazole], ciprofloxacin, coreg [carvedilol], diflucan [fluconazole], losartan, privigen [immune globulin (human)], sulfamethoxazole-trimethoprim, verapamil, zetia [ezetimibe], zocor [simvastatin - high dose], and gatifloxacin. ? ?MEDICAL HISTORY: ?Past Medical History:  ?Diagnosis Date  ? Aortic atherosclerosis ([place]) 01/23/2018  ? Atrial fibrillation ([place])   ? Carotid artery disease ([place])   ? Cholelithiasis 01/23/2018  ? Chronic anticoagulation   ? Coronary artery disease   ? status post coronary artery bypass grafting times 07/10/2004  ? Diabetes mellitus   ? Hypercholesteremia   ? Hypertension   ? Mechanical heart valve present   ? H. aortic valve replacement at the time of bypass surgery October 2005  ? Moderate to severe pulmonary hypertension (HCC)   ? Peripheral arterial disease ([place])   ? history of left common iliac artery PTA and stenting for a chronic total occlusion 08/26/01  ? ? ?SURGICAL HISTORY: ?Past Surgical History:  ?Procedure Laterality Date  ? anterior repair  2009  ? AORTIC VALVE REPLACEMENT  2005  ? Dr. Cyndia Bent  ?  CARDIAC CATHETERIZATION  11/10/2004  ? 40% right common illiac, 70% in stent restenosis of distal left common illiac,   ? CARDIAC CATHETERIZATION  05/18/2004  ? LAD 50-70% midstenosis, RCA dominant w/50% st

## 2022-02-14 NOTE — Progress Notes (Signed)
Discussed patient with Dr Trudie Reed from Rheumatology as well. ?She is not sure if ANCA titres have anything to do with hemolytic anemia ?Since there is no response to rituximab, and since she couldn't tolerate IVIG, we will go ahead and try cellcept 500 mg once daily. ?She will need labs CBC, CMP, retic count, LDH twice a week ? ?[name]  ? ?

## 2022-02-15 ENCOUNTER — Other Ambulatory Visit (HOSPITAL_COMMUNITY): Payer: Self-pay

## 2022-02-15 DIAGNOSIS — D638 Anemia in other chronic diseases classified elsewhere: Secondary | ICD-10-CM | POA: Diagnosis not present

## 2022-02-15 DIAGNOSIS — E1151 Type 2 diabetes mellitus with diabetic peripheral angiopathy without gangrene: Secondary | ICD-10-CM | POA: Diagnosis not present

## 2022-02-15 DIAGNOSIS — N184 Chronic kidney disease, stage 4 (severe): Secondary | ICD-10-CM | POA: Diagnosis not present

## 2022-02-15 DIAGNOSIS — D61818 Other pancytopenia: Secondary | ICD-10-CM | POA: Diagnosis not present

## 2022-02-15 DIAGNOSIS — E11319 Type 2 diabetes mellitus with unspecified diabetic retinopathy without macular edema: Secondary | ICD-10-CM | POA: Diagnosis not present

## 2022-02-15 DIAGNOSIS — I130 Hypertensive heart and chronic kidney disease with heart failure and stage 1 through stage 4 chronic kidney disease, or unspecified chronic kidney disease: Secondary | ICD-10-CM | POA: Diagnosis not present

## 2022-02-15 DIAGNOSIS — Z7901 Long term (current) use of anticoagulants: Secondary | ICD-10-CM | POA: Diagnosis not present

## 2022-02-15 DIAGNOSIS — I739 Peripheral vascular disease, unspecified: Secondary | ICD-10-CM | POA: Diagnosis not present

## 2022-02-15 DIAGNOSIS — I5032 Chronic diastolic (congestive) heart failure: Secondary | ICD-10-CM | POA: Diagnosis not present

## 2022-02-15 NOTE — Telephone Encounter (Signed)
error 

## 2022-02-16 DIAGNOSIS — M109 Gout, unspecified: Secondary | ICD-10-CM | POA: Diagnosis not present

## 2022-02-16 DIAGNOSIS — I7782 Antineutrophilic cytoplasmic antibody (ANCA) vasculitis: Secondary | ICD-10-CM | POA: Diagnosis not present

## 2022-02-16 DIAGNOSIS — N184 Chronic kidney disease, stage 4 (severe): Secondary | ICD-10-CM | POA: Diagnosis not present

## 2022-02-16 DIAGNOSIS — I129 Hypertensive chronic kidney disease with stage 1 through stage 4 chronic kidney disease, or unspecified chronic kidney disease: Secondary | ICD-10-CM | POA: Diagnosis not present

## 2022-02-16 DIAGNOSIS — D631 Anemia in chronic kidney disease: Secondary | ICD-10-CM | POA: Diagnosis not present

## 2022-02-16 DIAGNOSIS — E1122 Type 2 diabetes mellitus with diabetic chronic kidney disease: Secondary | ICD-10-CM | POA: Diagnosis not present

## 2022-02-16 DIAGNOSIS — I5032 Chronic diastolic (congestive) heart failure: Secondary | ICD-10-CM | POA: Diagnosis not present

## 2022-02-17 ENCOUNTER — Other Ambulatory Visit: Payer: Self-pay

## 2022-02-17 ENCOUNTER — Telehealth: Payer: Self-pay | Admitting: Hematology and Oncology

## 2022-02-17 ENCOUNTER — Inpatient Hospital Stay (HOSPITAL_BASED_OUTPATIENT_CLINIC_OR_DEPARTMENT_OTHER): Payer: Medicare HMO | Admitting: Hematology and Oncology

## 2022-02-17 ENCOUNTER — Inpatient Hospital Stay: Payer: Medicare HMO

## 2022-02-17 ENCOUNTER — Ambulatory Visit: Payer: Self-pay

## 2022-02-17 ENCOUNTER — Other Ambulatory Visit (HOSPITAL_COMMUNITY): Payer: Self-pay | Admitting: Cardiology

## 2022-02-17 ENCOUNTER — Telehealth: Payer: Self-pay

## 2022-02-17 VITALS — BP 139/52 | HR 68 | Temp 97.7°F | Resp 16 | Wt 138.6 lb

## 2022-02-17 DIAGNOSIS — D591 Autoimmune hemolytic anemia, unspecified: Secondary | ICD-10-CM

## 2022-02-17 DIAGNOSIS — I7782 Antineutrophilic cytoplasmic antibody (ANCA) vasculitis: Secondary | ICD-10-CM | POA: Diagnosis not present

## 2022-02-17 DIAGNOSIS — R739 Hyperglycemia, unspecified: Secondary | ICD-10-CM | POA: Diagnosis not present

## 2022-02-17 DIAGNOSIS — M8580 Other specified disorders of bone density and structure, unspecified site: Secondary | ICD-10-CM | POA: Diagnosis not present

## 2022-02-17 DIAGNOSIS — D649 Anemia, unspecified: Secondary | ICD-10-CM | POA: Diagnosis not present

## 2022-02-17 DIAGNOSIS — R197 Diarrhea, unspecified: Secondary | ICD-10-CM | POA: Diagnosis not present

## 2022-02-17 DIAGNOSIS — N1832 Chronic kidney disease, stage 3b: Secondary | ICD-10-CM | POA: Diagnosis not present

## 2022-02-17 DIAGNOSIS — R102 Pelvic and perineal pain: Secondary | ICD-10-CM | POA: Diagnosis not present

## 2022-02-17 DIAGNOSIS — R600 Localized edema: Secondary | ICD-10-CM | POA: Diagnosis not present

## 2022-02-17 DIAGNOSIS — N939 Abnormal uterine and vaginal bleeding, unspecified: Secondary | ICD-10-CM | POA: Diagnosis not present

## 2022-02-17 LAB — CMP (CANCER CENTER ONLY)
ALT: 21 U/L (ref 0–44)
AST: 24 U/L (ref 15–41)
Albumin: 3 g/dL — ABNORMAL LOW (ref 3.5–5.0)
Alkaline Phosphatase: 48 U/L (ref 38–126)
Anion gap: 9 (ref 5–15)
BUN: 104 mg/dL — ABNORMAL HIGH (ref 8–23)
CO2: 20 mmol/L — ABNORMAL LOW (ref 22–32)
Calcium: 8.3 mg/dL — ABNORMAL LOW (ref 8.9–10.3)
Chloride: 102 mmol/L (ref 98–111)
Creatinine: 2.35 mg/dL — ABNORMAL HIGH (ref 0.44–1.00)
GFR, Estimated: 20 mL/min — ABNORMAL LOW (ref >60)
Glucose, Bld: 196 mg/dL — ABNORMAL HIGH (ref 70–99)
Potassium: 4.3 mmol/L (ref 3.5–5.1)
Sodium: 131 mmol/L — ABNORMAL LOW (ref 135–145)
Total Bilirubin: 0.5 mg/dL (ref 0.3–1.2)
Total Protein: 5.1 g/dL — ABNORMAL LOW (ref 6.5–8.1)

## 2022-02-17 LAB — CBC WITH DIFFERENTIAL/PLATELET
Abs Immature Granulocytes: 0.01 10*3/uL (ref 0.00–0.07)
Basophils Absolute: 0 10*3/uL (ref 0.0–0.1)
Basophils Relative: 0 %
Eosinophils Absolute: 0 10*3/uL (ref 0.0–0.5)
Eosinophils Relative: 1 %
HCT: 28.3 % — ABNORMAL LOW (ref 36.0–46.0)
Hemoglobin: 9.5 g/dL — ABNORMAL LOW (ref 12.0–15.0)
Immature Granulocytes: 0 %
Lymphocytes Relative: 10 %
Lymphs Abs: 0.4 10*3/uL — ABNORMAL LOW (ref 0.7–4.0)
MCH: 31.8 pg (ref 26.0–34.0)
MCHC: 33.6 g/dL (ref 30.0–36.0)
MCV: 94.6 fL (ref 80.0–100.0)
Monocytes Absolute: 0.4 10*3/uL (ref 0.1–1.0)
Monocytes Relative: 10 %
Neutro Abs: 2.9 10*3/uL (ref 1.7–7.7)
Neutrophils Relative %: 79 %
Platelets: 113 10*3/uL — ABNORMAL LOW (ref 150–400)
RBC: 2.99 MIL/uL — ABNORMAL LOW (ref 3.87–5.11)
RDW: 18.3 % — ABNORMAL HIGH (ref 11.5–15.5)
WBC: 3.7 10*3/uL — ABNORMAL LOW (ref 4.0–10.5)
nRBC: 0 % (ref 0.0–0.2)

## 2022-02-17 LAB — RETIC PANEL
Immature Retic Fract: 6.3 % (ref 2.3–15.9)
RBC.: 2.93 MIL/uL — ABNORMAL LOW (ref 3.87–5.11)
Retic Count, Absolute: 115.7 10*3/uL (ref 19.0–186.0)
Retic Ct Pct: 4 % — ABNORMAL HIGH (ref 0.4–3.1)
Reticulocyte Hemoglobin: 33.2 pg (ref >27.9)

## 2022-02-17 MED ORDER — PREDNISONE 5 MG PO TABS: 5.0000 mg | ORAL_TABLET | Freq: Every day | ORAL | 0 refills | Status: DC

## 2022-02-17 NOTE — Telephone Encounter (Signed)
Called pt and made her aware she is overdue for INR to be checked .  ?Pt stated she had INR drawn Wednesday at PCP. I called PCP and requested INR results be [facility]ed to [facility], left message for medical records.  ?

## 2022-02-17 NOTE — Progress Notes (Signed)
[facility]: ?  ? ?[name], [name], MD ?[address] ?[place] [address] ? ?DIAGNOSIS:  ? ?SUMMARY OF HEMATOLOGIC HISTORY: ?81 y.o. [place] woman with multifactorial anemia, as follows: ?            (a) anemia of chronic inflammation: As of May 2020 she has a SED rate of 95, CRP 1.1, positive ANA and RF; subsequently she was found to be ANCA positive ?            (b) hemolysis (mild): she has a mildly elevated LDH and a DAT positive for complement, not IgG; this is c/w (a) above ?            (c) anemia of renal insufficiency: inadequate EPO resonse to anemia    ?                     (d) GIB/ iron deficiency in patient on lifelong anticoagulaton ?  ?(1) Feraheme received 03/01/2019, repeated 03/20/2019  ?            (a) reticulocyte 03/07/2019 up from 43.4 a year ago to 191.8 after iron infusion ?            (b) feraheme repeated on 05/12/2019 ?            (c) subsequent ferritin levels >100 ?  ?(2) darbepoetin: starting 05/05/2019 at 163mg given every 2 weeks ?            (a) dose increased to 200 mcg every week starting 07/01/2019  ?            (b) back to every 2 weeks beginning 08/04/2019 still at 200 mcg dose ?            (c) switched to Retacrit every 4 weeks as of 08/18/2019 ?  ?(3) ANCA positive vasculitis: diagnosed 05/08/2019 (titer 1:640) ?            (a) prednisone 40 mg daily started 05/31/2019 ?            (b) rituximab weekly started 06/12/2019 (received test dose 06/05/2019) ?                        (i) hepatitis B sAg, core Ab and HIV negative 05/08/2019 ?                        (ii) rituximab held after 07/14/2019 dose (received 5 weekly doses) with neutropenia ?                        (iii) rituximab resumed 08/18/2019 but changed to monthly ?                        (iv) rituximab changed to every 2 months beginning with 02/02/2020 dose ?                        (v) rituximab changed to every 12 weeks after the 10/12/2020 dose ?            (c) prednisone  tapered to off as of 09/15/2019 ?  ?(4) osteopenia with a T score of -1.5 on bone density 02/28/2016 at the breast center ? ?CURRENT THERAPY: Rituximab ? ?INTERVAL HISTORY: ? ?[name] 81y.o. female returns for her follow-up of anemia. ? ?  [name] is here for follow-up.  Given history of ANCA positive autoimmune hemolytic anemia, she received 8 weekly infusions of rituximab, currently continues on prednisone.  She relapsed twice when we tried to taper her off of prednisone.  She could not tolerate IVIG hence we have discussed about trying low-dose of mycophenolate mofetil.  She mentions of ongoing fatigue and severe edema in the feet although her edema in her legs has significantly improved.  She denies any bleeding.  She is willing to try the medication.  She says mycophenolate has made her a bit drowsy. ? ?Rest of  the pertinent 10 point ROS reviewed and negative. ? ? ?Patient Active Problem List  ? Diagnosis Date Noted  ? Hyperkalemia 02/04/2022  ? Acute renal failure superimposed on stage 3b chronic kidney disease ([place]) 02/04/2022  ? Hyperglycemia 01/09/2022  ? Autoimmune hemolytic anemia ([place]) 01/02/2022  ? Bilateral lower extremity edema 01/02/2022  ? Cold sore 01/02/2022  ? Symptomatic anemia 12/15/2021  ? Abnormal chest x-ray 12/15/2021  ? Supratherapeutic INR 12/15/2021  ? Elevated troponin 12/15/2021  ? Unilateral primary osteoarthritis, right hip 05/31/2021  ? Midline cystocele 05/30/2021  ? Primary localized osteoarthritis of pelvic region and thigh 05/30/2021  ? Vaginal pain 05/30/2021  ? Asymmetric SNHL (sensorineural hearing loss) 01/06/2021  ? Referred otalgia of both ears 01/06/2021  ? Bilateral hearing loss 08/31/2020  ? Bilateral impacted cerumen 08/31/2020  ? Low back pain 02/20/2020  ? CKD (chronic kidney disease), stage IV ([place]) 10/13/2019  ? Leukopenia due to antineoplastic chemotherapy ([place]) 08/18/2019  ? Chemotherapy induced neutropenia ([place]) 07/21/2019  ? Thrombophilia ([place])  06/19/2019  ? Encounter for general adult medical examination without abnormal findings 06/13/2019  ? Arteritis ([place]) 06/06/2019  ? Hypertensive heart and chronic kidney disease with heart failure and stage 1 through stage 4 chronic kidney disease, or unspecified chronic kidney disease ([place]) 06/06/2019  ? Muscle weakness 06/06/2019  ? Pancytopenia ([place]) 05/28/2019  ? Dyspnea 05/28/2019  ? Vasculitis, ANCA positive 05/28/2019  ? Disorder of connective tissue ([place]) 05/14/2019  ? Renal insufficiency 05/08/2019  ? Acute on chronic diastolic CHF (congestive heart failure) ([place]) 04/07/2019  ? Left leg cellulitis 04/07/2019  ? PAF (paroxysmal atrial fibrillation) ([place]) 04/07/2019  ? Iron deficiency anemia 02/28/2019  ? 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 ([place]) 02/28/2019  ? Diabetic retinopathy associated with type 2 diabetes mellitus ([place]) 09/02/2018  ? Chronic pain 06/19/2018  ? Non-thrombocytopenic purpura ([place]) 05/21/2018  ? Gout 03/22/2018  ? Malnutrition of moderate degree 03/12/2018  ? Acute on chronic diastolic heart failure ([place]) 03/06/2018  ? Diabetes mellitus type 2, controlled ([place]) 03/06/2018  ? S/P cholecystectomy 03/06/2018  ? HTN (hypertension) 03/06/2018  ? H/O mechanical aortic valve replacement 03/06/2018  ? Moderate to severe pulmonary hypertension ([place]) 03/06/2018  ? GERD (gastroesophageal reflux disease) 02/21/2018  ? Anxiety 02/21/2018  ? Chronic diastolic CHF (congestive heart failure) ([place]) 02/21/2018  ? Diarrhea 02/21/2018  ? General weakness 02/02/2018  ? Thrombocytopenia ([place]) 01/25/2018  ? Arm pain, diffuse 01/25/2018  ? Anemia 01/23/2018  ? Cholelithiasis 01/23/2018  ? Aortic atherosclerosis ([place]) 01/23/2018  ? GI bleed 01/18/2018  ? Age-related osteoporosis without current pathological fracture 05/24/2017  ? Carotid artery occlusion 05/24/2017  ? Generalized anxiety disorder 05/24/2017  ? Hearing loss 05/24/2017  ? Presence of prosthetic heart valve 05/24/2017  ?  Coronary artery disease 08/18/2013  ? Carotid artery disease ([place]) 08/18/2013  ? Peripheral arterial disease ([place]) 08/18/2013  ? Diabetes mellitus without complication (  [place]) 08/18/2013  ? Benign positional vertigo 10/06/2011  ? Hyponatremia 10/06/2011  ? Hypertension   ? Hypercholesteremia   ? Mechanical heart valve present   ? Chronic anticoagulation   ? ? ?is allergic to flagyl [metronidazole], ciprofloxacin, coreg [carvedilol], diflucan [fluconazole], losartan, privigen [immune globulin (human)], sulfamethoxazole-trimethoprim, verapamil, zetia [ezetimibe], zocor [simvastatin - high dose], and gatifloxacin. ? ?MEDICAL HISTORY: ?Past Medical History:  ?Diagnosis Date  ? Aortic atherosclerosis ([place]) 01/23/2018  ? Atrial fibrillation ([place])   ? Carotid artery disease ([place])   ? Cholelithiasis 01/23/2018  ? Chronic anticoagulation   ? Coronary artery disease   ? status post coronary artery bypass grafting times 07/10/2004  ? Diabetes mellitus   ? Hypercholesteremia   ? Hypertension   ? Mechanical heart valve present   ? H. aortic valve replacement at the time of bypass surgery October 2005  ? Moderate to severe pulmonary hypertension (HCC)   ? Peripheral arterial disease ([place])   ? history of left common iliac artery PTA and stenting for a chronic total occlusion 08/26/01  ? ? ?SURGICAL HISTORY: ?Past Surgical History:  ?Procedure Laterality Date  ? anterior repair  2009  ? AORTIC VALVE REPLACEMENT  2005  ? Dr. Cyndia Bent  ? CARDIAC CATHETERIZATION  11/10/2004  ? 40% right common illiac, 70% in stent restenosis of distal left common illiac,   ? CARDIAC CATHETERIZATION  05/18/2004  ? LAD 50-70% midstenosis, RCA dominant w/50% stenosis, 50% Right common Illiac artery ostial stenosis, 90% in stent restenosis within midportion of left common illiac stent  ? Carotid Duplex  03/12/2012  ? RSA-elev. velocities suggestive of a 50-69% diameter reduction, Right&Left Bulb/Prox ICA-mild-mod.fibrous plaqueelevating Velocities abnormal  study.  ? CHOLECYSTECTOMY N/A 03/01/2018  ? Procedure: LAPAROSCOPIC CHOLECYSTECTOMY;  Surgeon: Judeth Horn, MD;  Location: [facility];  Service: General;  Laterality: N/A;  ? COLONOSCOPY WITH PROPOFOL N/A 01/22/2018  ? Proc

## 2022-02-17 NOTE — Telephone Encounter (Signed)
Scheduled appointment per 5/12 los. Patient is aware. ?

## 2022-02-20 ENCOUNTER — Ambulatory Visit (HOSPITAL_COMMUNITY)
Admission: RE | Admit: 2022-02-20 | Discharge: 2022-02-20 | Disposition: A | Discharge status: Home / Self Care | Payer: Medicare HMO | Source: Ambulatory Visit | Attending: Adult Health | Admitting: Adult Health

## 2022-02-20 ENCOUNTER — Inpatient Hospital Stay (HOSPITAL_BASED_OUTPATIENT_CLINIC_OR_DEPARTMENT_OTHER): Payer: Medicare HMO | Admitting: Adult Health

## 2022-02-20 ENCOUNTER — Inpatient Hospital Stay: Payer: Medicare HMO

## 2022-02-20 ENCOUNTER — Other Ambulatory Visit: Payer: Self-pay

## 2022-02-20 VITALS — BP 147/51 | HR 62 | Temp 97.6°F | Resp 18 | Wt 135.0 lb

## 2022-02-20 DIAGNOSIS — R197 Diarrhea, unspecified: Secondary | ICD-10-CM | POA: Diagnosis not present

## 2022-02-20 DIAGNOSIS — R053 Chronic cough: Secondary | ICD-10-CM | POA: Insufficient documentation

## 2022-02-20 DIAGNOSIS — N939 Abnormal uterine and vaginal bleeding, unspecified: Secondary | ICD-10-CM | POA: Insufficient documentation

## 2022-02-20 DIAGNOSIS — D464 Refractory anemia, unspecified: Secondary | ICD-10-CM | POA: Diagnosis not present

## 2022-02-20 DIAGNOSIS — R918 Other nonspecific abnormal finding of lung field: Secondary | ICD-10-CM | POA: Insufficient documentation

## 2022-02-20 DIAGNOSIS — K5730 Diverticulosis of large intestine without perforation or abscess without bleeding: Secondary | ICD-10-CM | POA: Diagnosis not present

## 2022-02-20 DIAGNOSIS — R0602 Shortness of breath: Secondary | ICD-10-CM | POA: Diagnosis not present

## 2022-02-20 DIAGNOSIS — D591 Autoimmune hemolytic anemia, unspecified: Secondary | ICD-10-CM

## 2022-02-20 DIAGNOSIS — R0609 Other forms of dyspnea: Secondary | ICD-10-CM | POA: Insufficient documentation

## 2022-02-20 DIAGNOSIS — R140 Abdominal distension (gaseous): Secondary | ICD-10-CM | POA: Insufficient documentation

## 2022-02-20 DIAGNOSIS — M1611 Unilateral primary osteoarthritis, right hip: Secondary | ICD-10-CM | POA: Diagnosis not present

## 2022-02-20 DIAGNOSIS — D649 Anemia, unspecified: Secondary | ICD-10-CM | POA: Diagnosis not present

## 2022-02-20 DIAGNOSIS — D696 Thrombocytopenia, unspecified: Secondary | ICD-10-CM | POA: Diagnosis not present

## 2022-02-20 DIAGNOSIS — I700 Atherosclerosis of aorta: Secondary | ICD-10-CM | POA: Diagnosis not present

## 2022-02-20 DIAGNOSIS — N3289 Other specified disorders of bladder: Secondary | ICD-10-CM | POA: Diagnosis not present

## 2022-02-20 DIAGNOSIS — M438X6 Other specified deforming dorsopathies, lumbar region: Secondary | ICD-10-CM | POA: Diagnosis not present

## 2022-02-20 DIAGNOSIS — J8410 Pulmonary fibrosis, unspecified: Secondary | ICD-10-CM | POA: Diagnosis not present

## 2022-02-20 LAB — CBC WITH DIFFERENTIAL (CANCER CENTER ONLY)
Abs Immature Granulocytes: 0.03 10*3/uL (ref 0.00–0.07)
Basophils Absolute: 0 10*3/uL (ref 0.0–0.1)
Basophils Relative: 0 %
Eosinophils Absolute: 0 10*3/uL (ref 0.0–0.5)
Eosinophils Relative: 1 %
HCT: 27.8 % — ABNORMAL LOW (ref 36.0–46.0)
Hemoglobin: 9.3 g/dL — ABNORMAL LOW (ref 12.0–15.0)
Immature Granulocytes: 1 %
Lymphocytes Relative: 7 %
Lymphs Abs: 0.3 10*3/uL — ABNORMAL LOW (ref 0.7–4.0)
MCH: 31.7 pg (ref 26.0–34.0)
MCHC: 33.5 g/dL (ref 30.0–36.0)
MCV: 94.9 fL (ref 80.0–100.0)
Monocytes Absolute: 0.2 10*3/uL (ref 0.1–1.0)
Monocytes Relative: 4 %
Neutro Abs: 3.8 10*3/uL (ref 1.7–7.7)
Neutrophils Relative %: 87 %
Platelet Count: 103 10*3/uL — ABNORMAL LOW (ref 150–400)
RBC: 2.93 MIL/uL — ABNORMAL LOW (ref 3.87–5.11)
RDW: 17.6 % — ABNORMAL HIGH (ref 11.5–15.5)
WBC Count: 4.4 10*3/uL (ref 4.0–10.5)
nRBC: 0 % (ref 0.0–0.2)

## 2022-02-20 LAB — CMP (CANCER CENTER ONLY)
ALT: 23 U/L (ref 0–44)
AST: 28 U/L (ref 15–41)
Albumin: 3.2 g/dL — ABNORMAL LOW (ref 3.5–5.0)
Alkaline Phosphatase: 52 U/L (ref 38–126)
Anion gap: 8 (ref 5–15)
BUN: 113 mg/dL — ABNORMAL HIGH (ref 8–23)
CO2: 23 mmol/L (ref 22–32)
Calcium: 8 mg/dL — ABNORMAL LOW (ref 8.9–10.3)
Chloride: 100 mmol/L (ref 98–111)
Creatinine: 2.65 mg/dL — ABNORMAL HIGH (ref 0.44–1.00)
GFR, Estimated: 17 mL/min — ABNORMAL LOW (ref >60)
Glucose, Bld: 255 mg/dL — ABNORMAL HIGH (ref 70–99)
Potassium: 4.3 mmol/L (ref 3.5–5.1)
Sodium: 131 mmol/L — ABNORMAL LOW (ref 135–145)
Total Bilirubin: 0.4 mg/dL (ref 0.3–1.2)
Total Protein: 5.1 g/dL — ABNORMAL LOW (ref 6.5–8.1)

## 2022-02-20 MED ORDER — LIDOCAINE HCL 2 % IJ SOLN
15.0000 mL | Freq: Once | INTRAMUSCULAR | Status: DC
  Filled 2022-02-20: qty 20

## 2022-02-20 NOTE — Telephone Encounter (Signed)
Called and scheduled pt for anticoagulation appt to check INR on Wednesday 02/22/22.  ?

## 2022-02-20 NOTE — Progress Notes (Signed)
INDICATION:refractory anemia ? ?Brief examination was performed. ?ENT: adequate airway clearance ?Heart: regular rate and rhythm.No Murmurs ?Lungs: clear to auscultation, no wheezes, normal respiratory effort ? ?Bone Marrow Biopsy and Aspiration Procedure Note  ? ?Informed consent was obtained and potential risks including bleeding, infection and pain were reviewed with the patient. ? ?[name] name, date of birth, identification, consent and allergies were verified prior to the start of procedure and time out was performed. ? ?The left posterior iliac crest was chosen as the site of biopsy. ? ?The skin was prepped with ChloraPrep.  ? ?8 cc of 2% lidocaine was used to provide local anaesthesia.  ? ?10 cc of bone marrow aspirate was obtained followed by 1cm biopsy.  ?Pressure was applied to the biopsy site and bandage was placed over the biopsy site. ?Patient was made to lie on the back for 30 mins prior to discharge. ? ?The procedure was tolerated well. ?COMPLICATIONS: None ?BLOOD LOSS: none ?The patient was discharged home in stable condition with f/u tomorrow to review results.  Patient was provided with post bone marrow biopsy instructions and instructed to call if there was any bleeding or worsening pain. ? ?Specimens sent for flow cytometry, cytogenetics and additional studies. ? ?Signed ?Scot Dock, NP ? ?

## 2022-02-20 NOTE — Patient Instructions (Signed)
Bone Marrow Aspiration and Bone Marrow Biopsy, Adult Bone marrow aspiration and bone marrow biopsy are procedures that are done to diagnose blood disorders. These procedures may also be done to help diagnose cancer or certain infections. Bone marrow is the soft tissue that is inside the bones. Blood cells are produced in bone marrow. For bone marrow aspiration, a sample of tissue in liquid form is removed from inside the bone. For a bone marrow biopsy, a small sample of solid bone marrow tissue is removed. These samples are examined under a microscope or tested in a lab. You may need these procedures if you get an abnormal result on a blood test called a complete blood count (CBC). The aspiration or biopsy sample is usually taken from the upper part of the hip bone (iliac crest). Sometimes, an aspiration sample is taken from the chest bone (sternum). Tell a health care provider about: Any allergies you have. All medicines you are taking, including vitamins, herbs, eye drops, creams, and over-the-counter medicines. Any problems you or family members have had with anesthetic medicines. Any blood or bone disorders you have. Any surgeries you have had. Any medical conditions you have. Any recent infections you have had, including skin infections. Whether you are pregnant or may be pregnant. What are the risks? Generally, this is a safe procedure. However, problems may occur, including: Bleeding. Infection. Persistent pain after the procedure. Cracking (fracture) of the bone. Allergic reactions to medicines. Damage to nearby organs, if the sample is taken from the sternum. What happens before the procedure? Staying hydrated Follow instructions from your health care provider about hydration, which may include: Up to 2 hours before the procedure - you may continue to drink clear liquids, such as water, clear fruit juice, black coffee, and plain tea.  Eating and drinking restrictions Follow  instructions from your health care provider about eating and drinking, which may include: 8 hours before the procedure - stop eating heavy meals or foods, such as meat, fried foods, or fatty foods. 6 hours before the procedure - stop eating light meals or foods, such as toast or cereal. 6 hours before the procedure - stop drinking milk or drinks that contain milk. 2 hours before the procedure - stop drinking clear liquids. Medicines Ask your health care provider about: Changing or stopping your regular medicines. This is especially important if you are taking diabetes medicines or blood thinners. Taking medicines such as aspirin and ibuprofen. These medicines can thin your blood. Do not take these medicines unless your health care provider tells you to take them. Taking over-the-counter medicines, vitamins, herbs, and supplements. General instructions Plan to have someone take you home from the hospital or clinic. If you will be going home right after the procedure, plan to have someone with you for 24 hours. Ask your health care provider: How your surgery site will be marked. What steps will be taken to help prevent infection. These may include: Removing hair at the surgery site. Washing skin with a germ-killing soap. What happens during the procedure?  An IV may be inserted into one of your veins. You will be given one or more of the following: A medicine to help you relax (sedative). A medicine to numb the area (local anesthetic). A medicine to make you fall asleep (general anesthetic). The bone marrow sample will be removed as follows: For an aspiration, a hollow needle will be inserted through your skin and into your bone. Bone marrow fluid will be drawn up into   a syringe. For a biopsy, your health care provider will use a hollow needle to remove a small sample of solid tissue from your bone marrow. The needle will be removed. Bone marrow fluid or tissue will be sent to a lab for  examination. A bandage (dressing) will be placed over the insertion site and taped in place. The procedure may vary among health care providers and hospitals. What happens after the procedure? Your blood pressure, heart rate, breathing rate, and blood oxygen level will be monitored until you leave the hospital or clinic. Your IV will be removed, and the insertion site will be checked for bleeding. Do not drive for 24 hours if you were given a sedative during your procedure. It is up to you to get the results of your procedure. Ask your health care provider, or the department that is doing the procedure, when your results will be ready. Summary Bone marrow aspiration and bone marrow biopsy are procedures that are done to diagnose blood disorders. These procedures may also be done to help diagnose cancer or certain infections. Before the procedure, tell your health care provider about all medicines you are taking, including vitamins, herbs, eye drops, creams, and over-the-counter medicines. Plan to have someone take you home from the hospital or clinic. During the aspiration procedure, a sample of tissue in liquid form is removed from inside the bone. For a bone marrow biopsy, a small sample of solid bone marrow tissue is removed. After the procedure, you will be monitored and checked for bleeding. This information is not intended to replace advice given to you by your health care provider. Make sure you discuss any questions you have with your health care provider. Document Revised: 02/11/2019 Document Reviewed: 02/11/2019 Elsevier Patient Education  2023 [employer] Inc[employer]  

## 2022-02-20 NOTE — Progress Notes (Signed)
Pt observed for 30 minutes post Bone Marrow Biopsy. Biopsy site clear of drainage and bleeding. Pt denied pain at site. VSS post biopsy.  ? ?[facility] NP informed of new vaginal bleeding, profuse diarrhea and redness and swelling in the feet. CT scan scheduled for after biopsy today. Pt informed to remain NPO prior to scan. Pt taken to receive oral contrast and instructed about how to drink contrast prior to scan. Pt transported to scan in wheelchair by tech.  ?

## 2022-02-20 NOTE — Assessment & Plan Note (Signed)
[name] is here today for her refractory anemia.  She is doing moderately well.  We did a bone marrow biopsy on her to evaluate for primary bone marrow disorder that could be causing her anemia.  Considering her pelvic pain, vaginal bleeding, and diarrhea I have ordered CT chest abdomen and pelvis.  It is very unclear what is causing her anemia so we will rule out a lymphoproliferative process as well as a gynecologic malignancy that can lead to vaginal bleeding.  Considering her GFR is less than 30, we ordered the scans without contrast. ? ?Brya will return tomorrow for labs and follow-up.  I reviewed the above with Dr. Chryl Heck as well who is in agreement with the above plan. ? ? ?

## 2022-02-20 NOTE — Progress Notes (Signed)
[facility]: ?  ? ?[name], [name], MD ?[address] ?[place] [address] ? ? ?DIAGNOSIS: refractory anemia ? ?SUMMARY OF HEMATOLOGIC HISTORY: ?81 y.o. [place] woman with multifactorial anemia, as follows: ?            (a) anemia of chronic inflammation: As of May 2020 she has a SED rate of 95, CRP 1.1, positive ANA and RF; subsequently she was found to be ANCA positive ?            (b) hemolysis (mild): she has a mildly elevated LDH and a DAT positive for complement, not IgG; this is c/w (a) above ?            (c) anemia of renal insufficiency: inadequate EPO resonse to anemia    ?                     (d) GIB/ iron deficiency in patient on lifelong anticoagulaton ?  ?(1) Feraheme received 03/01/2019, repeated 03/20/2019  ?            (a) reticulocyte 03/07/2019 up from 43.4 a year ago to 191.8 after iron infusion ?            (b) feraheme repeated on 05/12/2019 ?            (c) subsequent ferritin levels >100 ?  ?(2) darbepoetin: starting 05/05/2019 at 182mg given every 2 weeks ?            (a) dose increased to 200 mcg every week starting 07/01/2019  ?            (b) back to every 2 weeks beginning 08/04/2019 still at 200 mcg dose ?            (c) switched to Retacrit every 4 weeks as of 08/18/2019 ?  ?(3) ANCA positive vasculitis: diagnosed 05/08/2019 (titer 1:640) ?            (a) prednisone 40 mg daily started 05/31/2019 ?            (b) rituximab weekly started 06/12/2019 (received test dose 06/05/2019) ?                        (i) hepatitis B sAg, core Ab and HIV negative 05/08/2019 ?                        (ii) rituximab held after 07/14/2019 dose (received 5 weekly doses) with neutropenia ?                        (iii) rituximab resumed 08/18/2019 but changed to monthly ?                        (iv) rituximab changed to every 2 months beginning with 02/02/2020 dose ?                        (v) rituximab changed to every 12 weeks after the 10/12/2020 dose ?  (Vi)  rituximab restarted at weekly beginning 12/20/2021-01/30/2022 with no improvement in hemoglobin ?  (Vii) IVIG given on 02/06/2022 with poor tolerance  ?            (c) prednisone tapered off 09/15/2019; restarted 12/13/2020 ? (D) bone marrow biopsy on 02/20/2022 ?  ?  ?(4)  osteopenia with a T score of -1.5 on bone density 02/28/2016 at the [facility] center ? ?CURRENT THERAPY: high dose prednisone ? ?INTERVAL HISTORY: ?KEYANI RIGDON 81 y.o. female returns for follow up of her refractory anemia.  She has a multifactorial anemia initially related to ANCA positive vasculitis that responded to rituxan, however more recently she has not responded to rituximab treatment.   ? ?Over the weekend she tells me she has developed worsening pelvic pain and increasing watery vaginal discharge.  She describes the drainage as copious watery fluids and she went through about 12 pads yesterday.  This morning she developed vaginal bleeding.  Yesterday she also had two diarrhea stools, watery in consistency.  She denies any fever or chills.  This happened a couple of months ago, and then resolved.   ? ? ?Patient Active Problem List  ? Diagnosis Date Noted  ? Hyperkalemia 02/04/2022  ? Acute renal failure superimposed on stage 3b chronic kidney disease ([place]) 02/04/2022  ? Hyperglycemia 01/09/2022  ? Autoimmune hemolytic anemia ([place]) 01/02/2022  ? Bilateral lower extremity edema 01/02/2022  ? Cold sore 01/02/2022  ? Refractory anemia ([place]) 12/15/2021  ? Abnormal chest x-ray 12/15/2021  ? Supratherapeutic INR 12/15/2021  ? Elevated troponin 12/15/2021  ? Unilateral primary osteoarthritis, right hip 05/31/2021  ? Midline cystocele 05/30/2021  ? Primary localized osteoarthritis of pelvic region and thigh 05/30/2021  ? Vaginal pain 05/30/2021  ? Asymmetric SNHL (sensorineural hearing loss) 01/06/2021  ? Referred otalgia of both ears 01/06/2021  ? Bilateral hearing loss 08/31/2020  ? Bilateral impacted cerumen 08/31/2020  ? Low back pain 02/20/2020  ?  CKD (chronic kidney disease), stage IV ([place]) 10/13/2019  ? Leukopenia due to antineoplastic chemotherapy ([place]) 08/18/2019  ? Chemotherapy induced neutropenia ([place]) 07/21/2019  ? Thrombophilia ([place]) 06/19/2019  ? Encounter for general adult medical examination without abnormal findings 06/13/2019  ? Arteritis ([place]) 06/06/2019  ? Hypertensive heart and chronic kidney disease with heart failure and stage 1 through stage 4 chronic kidney disease, or unspecified chronic kidney disease ([place]) 06/06/2019  ? Muscle weakness 06/06/2019  ? Pancytopenia ([place]) 05/28/2019  ? Dyspnea 05/28/2019  ? Vasculitis, ANCA positive 05/28/2019  ? Disorder of connective tissue ([place]) 05/14/2019  ? Renal insufficiency 05/08/2019  ? Acute on chronic diastolic CHF (congestive heart failure) ([place]) 04/07/2019  ? Left leg cellulitis 04/07/2019  ? PAF (paroxysmal atrial fibrillation) ([place]) 04/07/2019  ? Iron deficiency anemia 02/28/2019  ? 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 ([place]) 02/28/2019  ? Diabetic retinopathy associated with type 2 diabetes mellitus ([place]) 09/02/2018  ? Chronic pain 06/19/2018  ? Non-thrombocytopenic purpura ([place]) 05/21/2018  ? Gout 03/22/2018  ? Malnutrition of moderate degree 03/12/2018  ? Acute on chronic diastolic heart failure ([place]) 03/06/2018  ? Diabetes mellitus type 2, controlled ([place]) 03/06/2018  ? S/P cholecystectomy 03/06/2018  ? HTN (hypertension) 03/06/2018  ? H/O mechanical aortic valve replacement 03/06/2018  ? Moderate to severe pulmonary hypertension ([place]) 03/06/2018  ? GERD (gastroesophageal reflux disease) 02/21/2018  ? Anxiety 02/21/2018  ? Chronic diastolic CHF (congestive heart failure) ([place]) 02/21/2018  ? Diarrhea 02/21/2018  ? General weakness 02/02/2018  ? Thrombocytopenia ([place]) 01/25/2018  ? Arm pain, diffuse 01/25/2018  ? Anemia 01/23/2018  ? Cholelithiasis 01/23/2018  ? Aortic atherosclerosis ([place]) 01/23/2018  ? GI bleed 01/18/2018  ? Age-related osteoporosis without current  pathological fracture 05/24/2017  ? Carotid artery occlusion 05/24/2017  ? Generalized anxiety disorder 05/24/2017  ? Hearing loss 05/24/2017  ? Presence of prosthetic  heart valve 05/24/2017  ? Coronary artery disease 08/18/2013  ? Carotid artery disease ([place]) 08/18/2013  ? Peripheral arterial disease ([place]) 08/18/2013  ? Diabetes mellitus without complication ([place]) 34/96/1164  ? Benign positional vertigo 10/06/2011  ? Hyponatremia 10/06/2011  ? Hypertension   ? Hypercholesteremia   ? Mechanical heart valve present   ? Chronic anticoagulation   ? ? ?is allergic to flagyl [metronidazole], ciprofloxacin, coreg [carvedilol], diflucan [fluconazole], losartan, privigen [immune globulin (human)], sulfamethoxazole-trimethoprim, verapamil, zetia [ezetimibe], zocor [simvastatin - high dose], and gatifloxacin. ? ?MEDICAL HISTORY: ?Past Medical History:  ?Diagnosis Date  ? Aortic atherosclerosis ([place]) 01/23/2018  ? Atrial fibrillation ([place])   ? Carotid artery disease ([place])   ? Cholelithiasis 01/23/2018  ? Chronic anticoagulation   ? Coronary artery disease   ? status post coronary artery bypass grafting times 07/10/2004  ? Diabetes mellitus   ? Hypercholesteremia   ? Hypertension   ? Mechanical heart valve present   ? H. aortic valve replacement at the time of bypass surgery October 2005  ? Moderate to severe pulmonary hypertension ([facility])   ? Peripheral arterial disease ([place])   ? history of left common iliac artery PTA and stenting for a chronic total occlusion 08/26/01  ? ? ?SURGICAL HISTORY: ?Past Surgical History:  ?Procedure Laterality Date  ? anterior repair  2009  ? AORTIC VALVE REPLACEMENT  2005  ? Dr. Cyndia Bent  ? CARDIAC CATHETERIZATION  11/10/2004  ? 40% right common illiac, 70% in stent restenosis of distal left common illiac,   ? CARDIAC CATHETERIZATION  05/18/2004  ? LAD 50-70% midstenosis, RCA dominant w/50% stenosis, 50% Right common Illiac artery ostial stenosis, 90% in stent restenosis within midportion of left  common illiac stent  ? Carotid Duplex  03/12/2012  ? RSA-elev. velocities suggestive of a 50-69% diameter reduction, Right&Left Bulb/Prox ICA-mild-mod.fibrous plaqueelevating Velocities abnormal study.  ? [facility]

## 2022-02-21 ENCOUNTER — Telehealth: Payer: Self-pay

## 2022-02-21 ENCOUNTER — Telehealth: Payer: Self-pay | Admitting: *Deleted

## 2022-02-21 ENCOUNTER — Inpatient Hospital Stay: Payer: Medicare HMO

## 2022-02-21 ENCOUNTER — Inpatient Hospital Stay: Payer: Medicare HMO | Admitting: Adult Health

## 2022-02-21 DIAGNOSIS — I480 Paroxysmal atrial fibrillation: Secondary | ICD-10-CM | POA: Diagnosis not present

## 2022-02-21 DIAGNOSIS — N1832 Chronic kidney disease, stage 3b: Secondary | ICD-10-CM | POA: Diagnosis not present

## 2022-02-21 DIAGNOSIS — N189 Chronic kidney disease, unspecified: Secondary | ICD-10-CM | POA: Diagnosis not present

## 2022-02-21 DIAGNOSIS — D631 Anemia in chronic kidney disease: Secondary | ICD-10-CM | POA: Diagnosis not present

## 2022-02-21 DIAGNOSIS — D591 Autoimmune hemolytic anemia, unspecified: Secondary | ICD-10-CM | POA: Diagnosis not present

## 2022-02-21 DIAGNOSIS — I5033 Acute on chronic diastolic (congestive) heart failure: Secondary | ICD-10-CM | POA: Diagnosis not present

## 2022-02-21 DIAGNOSIS — N184 Chronic kidney disease, stage 4 (severe): Secondary | ICD-10-CM | POA: Diagnosis not present

## 2022-02-21 DIAGNOSIS — I130 Hypertensive heart and chronic kidney disease with heart failure and stage 1 through stage 4 chronic kidney disease, or unspecified chronic kidney disease: Secondary | ICD-10-CM | POA: Diagnosis not present

## 2022-02-21 DIAGNOSIS — N952 Postmenopausal atrophic vaginitis: Secondary | ICD-10-CM | POA: Diagnosis not present

## 2022-02-21 DIAGNOSIS — E1122 Type 2 diabetes mellitus with diabetic chronic kidney disease: Secondary | ICD-10-CM | POA: Diagnosis not present

## 2022-02-21 DIAGNOSIS — D509 Iron deficiency anemia, unspecified: Secondary | ICD-10-CM | POA: Diagnosis not present

## 2022-02-21 NOTE — Telephone Encounter (Signed)
Pt called and asked if she still needs to come to appt today. Advised pt she would need to come in to review CT results and repeat labs. Pt knows to arrive today at 1400 for check in. ?

## 2022-02-21 NOTE — Telephone Encounter (Signed)
INR results from 02/15/2022 were scanned into the chart by another user on today after being received from PCP. The labs are over a week old so unable to use to dose pt's INR. Pt was made aware of this per phone conversation on 02/20/2022-please refer to telephone encounter from yesterday. Also, an appt was made for the pt in the office for 02/22/2022 for an INR check.  ?

## 2022-02-22 ENCOUNTER — Other Ambulatory Visit: Payer: Self-pay

## 2022-02-22 ENCOUNTER — Ambulatory Visit (INDEPENDENT_AMBULATORY_CARE_PROVIDER_SITE_OTHER): Payer: Medicare HMO | Admitting: *Deleted

## 2022-02-22 DIAGNOSIS — Z7901 Long term (current) use of anticoagulants: Secondary | ICD-10-CM | POA: Diagnosis not present

## 2022-02-22 DIAGNOSIS — D591 Autoimmune hemolytic anemia, unspecified: Secondary | ICD-10-CM

## 2022-02-22 DIAGNOSIS — Z952 Presence of prosthetic heart valve: Secondary | ICD-10-CM | POA: Diagnosis not present

## 2022-02-22 LAB — POCT INR: INR: 3.6 — AB (ref 2.0–3.0)

## 2022-02-22 LAB — SURGICAL PATHOLOGY

## 2022-02-22 NOTE — Patient Instructions (Addendum)
Description   ?Do not take any Warfarin today then start taking 1 tablet daily except 1.5 tablets on Sundays. Recheck in 1 week with [facility]. Call with Prednisone dosage changes, 02/22/22-reports on '25mg'$  daily. Please keep green leafy veggies and boost in your diet.  ? ?Call with any new meds [phone] & if you have any procedures where you need to hold warfarin, have them fax a clearance form or labs to [phone]. ? ? ?  ?  ?  ?

## 2022-02-23 ENCOUNTER — Inpatient Hospital Stay (HOSPITAL_BASED_OUTPATIENT_CLINIC_OR_DEPARTMENT_OTHER): Payer: Medicare HMO | Admitting: Adult Health

## 2022-02-23 ENCOUNTER — Inpatient Hospital Stay: Payer: Medicare HMO

## 2022-02-23 ENCOUNTER — Other Ambulatory Visit: Payer: Self-pay

## 2022-02-23 ENCOUNTER — Telehealth: Payer: Self-pay | Admitting: *Deleted

## 2022-02-23 ENCOUNTER — Other Ambulatory Visit: Payer: Self-pay | Admitting: *Deleted

## 2022-02-23 ENCOUNTER — Encounter: Payer: Self-pay | Admitting: Adult Health

## 2022-02-23 VITALS — BP 142/62 | HR 82 | Temp 98.1°F | Resp 18 | Ht 59.0 in | Wt 136.3 lb

## 2022-02-23 DIAGNOSIS — D594 Other nonautoimmune hemolytic anemias: Secondary | ICD-10-CM

## 2022-02-23 DIAGNOSIS — T451X5A Adverse effect of antineoplastic and immunosuppressive drugs, initial encounter: Secondary | ICD-10-CM

## 2022-02-23 DIAGNOSIS — I7782 Antineutrophilic cytoplasmic antibody (ANCA) vasculitis: Secondary | ICD-10-CM | POA: Diagnosis not present

## 2022-02-23 DIAGNOSIS — D649 Anemia, unspecified: Secondary | ICD-10-CM

## 2022-02-23 DIAGNOSIS — I6523 Occlusion and stenosis of bilateral carotid arteries: Secondary | ICD-10-CM

## 2022-02-23 DIAGNOSIS — D696 Thrombocytopenia, unspecified: Secondary | ICD-10-CM

## 2022-02-23 DIAGNOSIS — M8580 Other specified disorders of bone density and structure, unspecified site: Secondary | ICD-10-CM | POA: Diagnosis not present

## 2022-02-23 DIAGNOSIS — R739 Hyperglycemia, unspecified: Secondary | ICD-10-CM

## 2022-02-23 DIAGNOSIS — R102 Pelvic and perineal pain: Secondary | ICD-10-CM | POA: Diagnosis not present

## 2022-02-23 DIAGNOSIS — N939 Abnormal uterine and vaginal bleeding, unspecified: Secondary | ICD-10-CM | POA: Diagnosis not present

## 2022-02-23 DIAGNOSIS — N1832 Chronic kidney disease, stage 3b: Secondary | ICD-10-CM | POA: Diagnosis not present

## 2022-02-23 DIAGNOSIS — D591 Autoimmune hemolytic anemia, unspecified: Secondary | ICD-10-CM

## 2022-02-23 DIAGNOSIS — R197 Diarrhea, unspecified: Secondary | ICD-10-CM | POA: Diagnosis not present

## 2022-02-23 LAB — CMP (CANCER CENTER ONLY)
ALT: 27 U/L (ref 0–44)
AST: 32 U/L (ref 15–41)
Albumin: 3.2 g/dL — ABNORMAL LOW (ref 3.5–5.0)
Alkaline Phosphatase: 63 U/L (ref 38–126)
Anion gap: 10 (ref 5–15)
BUN: 123 mg/dL — ABNORMAL HIGH (ref 8–23)
CO2: 20 mmol/L — ABNORMAL LOW (ref 22–32)
Calcium: 8 mg/dL — ABNORMAL LOW (ref 8.9–10.3)
Chloride: 96 mmol/L — ABNORMAL LOW (ref 98–111)
Creatinine: 2.63 mg/dL — ABNORMAL HIGH (ref 0.44–1.00)
GFR, Estimated: 18 mL/min — ABNORMAL LOW (ref >60)
Glucose, Bld: 556 mg/dL (ref 70–99)
Potassium: 4.8 mmol/L (ref 3.5–5.1)
Sodium: 126 mmol/L — ABNORMAL LOW (ref 135–145)
Total Bilirubin: 0.7 mg/dL (ref 0.3–1.2)
Total Protein: 5.5 g/dL — ABNORMAL LOW (ref 6.5–8.1)

## 2022-02-23 LAB — URINALYSIS, COMPLETE (UACMP) WITH MICROSCOPIC
Bilirubin Urine: NEGATIVE
Glucose, UA: 50 mg/dL — AB
Ketones, ur: NEGATIVE mg/dL
Nitrite: NEGATIVE
Protein, ur: 100 mg/dL — AB
RBC / HPF: >50 RBC/hpf — ABNORMAL HIGH (ref 0–5)
Specific Gravity, Urine: 1.011 (ref 1.005–1.030)
WBC, UA: >50 WBC/hpf — ABNORMAL HIGH (ref 0–5)
pH: 5 (ref 5.0–8.0)

## 2022-02-23 LAB — CBC WITH DIFFERENTIAL (CANCER CENTER ONLY)
Abs Immature Granulocytes: 0.02 10*3/uL (ref 0.00–0.07)
Basophils Absolute: 0 10*3/uL (ref 0.0–0.1)
Basophils Relative: 0 %
Eosinophils Absolute: 0 10*3/uL (ref 0.0–0.5)
Eosinophils Relative: 0 %
HCT: 28.3 % — ABNORMAL LOW (ref 36.0–46.0)
Hemoglobin: 9.5 g/dL — ABNORMAL LOW (ref 12.0–15.0)
Immature Granulocytes: 1 %
Lymphocytes Relative: 5 %
Lymphs Abs: 0.2 10*3/uL — ABNORMAL LOW (ref 0.7–4.0)
MCH: 31.8 pg (ref 26.0–34.0)
MCHC: 33.6 g/dL (ref 30.0–36.0)
MCV: 94.6 fL (ref 80.0–100.0)
Monocytes Absolute: 0.1 10*3/uL (ref 0.1–1.0)
Monocytes Relative: 1 %
Neutro Abs: 4.1 10*3/uL (ref 1.7–7.7)
Neutrophils Relative %: 93 %
Platelet Count: 144 10*3/uL — ABNORMAL LOW (ref 150–400)
RBC: 2.99 MIL/uL — ABNORMAL LOW (ref 3.87–5.11)
RDW: 17.3 % — ABNORMAL HIGH (ref 11.5–15.5)
WBC Count: 4.3 10*3/uL (ref 4.0–10.5)
nRBC: 0 % (ref 0.0–0.2)

## 2022-02-23 LAB — RETIC PANEL
Immature Retic Fract: 5.6 % (ref 2.3–15.9)
RBC.: 2.93 MIL/uL — ABNORMAL LOW (ref 3.87–5.11)
Retic Count, Absolute: 70.3 10*3/uL (ref 19.0–186.0)
Retic Ct Pct: 2.4 % (ref 0.4–3.1)
Reticulocyte Hemoglobin: 35.5 pg (ref >27.9)

## 2022-02-23 LAB — SAMPLE TO BLOOD BANK

## 2022-02-23 MED ORDER — INSULIN ASPART 100 UNIT/ML IJ SOLN
10.0000 [IU] | Freq: Once | INTRAMUSCULAR | Status: DC
  Filled 2022-02-23: qty 1

## 2022-02-23 MED ORDER — INSULIN ASPART 100 UNIT/ML IJ SOLN
10.0000 [IU] | Freq: Once | INTRAMUSCULAR | Status: AC
  Administered 2022-02-23: 10 [IU] via SUBCUTANEOUS

## 2022-02-23 MED ORDER — NITROFURANTOIN MONOHYD MACRO 100 MG PO CAPS: 100.0000 mg | ORAL_CAPSULE | Freq: Two times a day (BID) | ORAL | 0 refills | Status: DC

## 2022-02-23 MED ORDER — INSULIN ASPART 100 UNIT/ML IJ SOLN: INTRAMUSCULAR | 11 refills | Status: DC

## 2022-02-23 NOTE — Progress Notes (Signed)
Blood sugar recheck one hour after insulin, it was 483. Mendel Ryder PA notified.

## 2022-02-23 NOTE — Progress Notes (Signed)
[facility] Follow up:    [name], [name], MD [place] [address]   DIAGNOSIS: refractory anemia  SUMMARY OF HEMATOLOGIC HISTORY: 81 y.o. [name] woman with multifactorial anemia, as follows:             (a) anemia of chronic inflammation: As of May 2020 she has a SED rate of 95, CRP 1.1, positive ANA and RF; subsequently she was found to be ANCA positive             (b) hemolysis (mild): she has a mildly elevated LDH and a DAT positive for complement, not IgG; this is c/w (a) above             (c) anemia of renal insufficiency: inadequate EPO resonse to anemia                         (d) GIB/ iron deficiency in patient on lifelong anticoagulaton   (1) Feraheme received 03/01/2019, repeated 03/20/2019              (a) reticulocyte 03/07/2019 up from 43.4 a year ago to 191.8 after iron infusion             (b) feraheme repeated on 05/12/2019             (c) subsequent ferritin levels >100   (2) darbepoetin: starting 05/05/2019 at 1105mg given every 2 weeks             (a) dose increased to 200 mcg every week starting 07/01/2019              (b) back to every 2 weeks beginning 08/04/2019 still at 200 mcg dose             (c) switched to Retacrit every 4 weeks as of 08/18/2019   (3) ANCA positive vasculitis: diagnosed 05/08/2019 (titer 1:640)             (a) prednisone 40 mg daily started 05/31/2019             (b) rituximab weekly started 06/12/2019 (received test dose 06/05/2019)                         (i) hepatitis B sAg, core Ab and HIV negative 05/08/2019                         (ii) rituximab held after 07/14/2019 dose (received 5 weekly doses) with neutropenia                         (iii) rituximab resumed 08/18/2019 but changed to monthly                         (iv) rituximab changed to every 2 months beginning with 02/02/2020 dose                         (v) rituximab changed to every 12 weeks after the 10/12/2020 dose   (Vi)  rituximab restarted at weekly beginning 12/20/2021-01/30/2022 with no improvement in hemoglobin   (Vii) IVIG given on 02/06/2022 with poor tolerance              (c) prednisone tapered off 09/15/2019; restarted 12/13/2020  (D) bone marrow biopsy on 02/20/2022     (4)  osteopenia with a T score of -1.5 on bone density 02/28/2016 at the [facility]  CURRENT THERAPY: prednisone/cellcept  INTERVAL HISTORY: [name] 81 y.o. female returns for follow up of her refractory anemia.  She has a multifactorial anemia initially related to ANCA positive vasculitis that responded to rituxan, however more recently she has not responded to rituximab treatment.    She was begun on Cellcept.  She notes increased GI upset with diarrhea about 3 times a day.  She has pelvic pain.  She previously had informed us about vaginal bleeding, however upon further inquiry she tells me she is experiencing hematuria.  She underwent CT chest/abd/pelvis that did not identify any lymphadenopathy.  It noted a questionable colovesicular fistula, and foci of gas within vagina which could indicate a fistulous connection.    [name] tells me that when she experiences diarrhea, it comes out of her rectum.  She denies any bowels coming from her vagina.  She notes she is urinating more.    Patient Active Problem List   Diagnosis Date Noted   Hyperkalemia 02/04/2022   Acute renal failure superimposed on stage 3b chronic kidney disease ([place]) 02/04/2022   Hyperglycemia 01/09/2022   Autoimmune hemolytic anemia (HCC) 01/02/2022   Bilateral lower extremity edema 01/02/2022   Refractory anemia (HCC) 12/15/2021   Abnormal chest x-ray 12/15/2021   Supratherapeutic INR 12/15/2021   Elevated troponin 12/15/2021   Unilateral primary osteoarthritis, right hip 05/31/2021   Midline cystocele 05/30/2021   Primary localized osteoarthritis of pelvic region and thigh 05/30/2021   Vaginal pain 05/30/2021   Asymmetric SNHL (sensorineural hearing  loss) 01/06/2021   Referred otalgia of both ears 01/06/2021   Bilateral hearing loss 08/31/2020   Low back pain 02/20/2020   CKD (chronic kidney disease), stage IV ([place]) 10/13/2019   Leukopenia due to antineoplastic chemotherapy ([place]) 08/18/2019   Thrombophilia ([place]) 06/19/2019   Encounter for general adult medical examination without abnormal findings 06/13/2019   Arteritis ([place]) 06/06/2019   Hypertensive heart and chronic kidney disease with heart failure and stage 1 through stage 4 chronic kidney disease, or unspecified chronic kidney disease ([place]) 06/06/2019   Muscle weakness 06/06/2019   Pancytopenia ([place]) 05/28/2019   Dyspnea 05/28/2019   Vasculitis, ANCA positive 05/28/2019   Disorder of connective tissue ([place]) 05/14/2019   Renal insufficiency 05/08/2019   Acute on chronic diastolic CHF (congestive heart failure) ([place]) 04/07/2019   Left leg cellulitis 04/07/2019   PAF (paroxysmal atrial fibrillation) ([place]) 04/07/2019   Iron deficiency anemia 02/28/2019   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 ([place]) 02/28/2019   Diabetic retinopathy associated with type 2 diabetes mellitus ([place]) 09/02/2018   Chronic pain 06/19/2018   Non-thrombocytopenic purpura ([place]) 05/21/2018   Gout 03/22/2018   Malnutrition of moderate degree 03/12/2018   Diabetes mellitus type 2, controlled ([place]) 03/06/2018   S/P cholecystectomy 03/06/2018   HTN (hypertension) 03/06/2018   H/O mechanical aortic valve replacement 03/06/2018   Moderate to severe pulmonary hypertension ([place]) 03/06/2018   GERD (gastroesophageal reflux disease) 02/21/2018   Anxiety 02/21/2018   Chronic diastolic CHF (congestive heart failure) ([place]) 02/21/2018   Diarrhea 02/21/2018   General weakness 02/02/2018   Thrombocytopenia ([place]) 01/25/2018   Arm pain, diffuse 01/25/2018   Anemia 01/23/2018   Aortic atherosclerosis ([place]) 01/23/2018   GI bleed 01/18/2018   Age-related osteoporosis without current pathological  fracture 05/24/2017   Carotid artery occlusion 05/24/2017   Generalized anxiety disorder 05/24/2017   Hearing loss 05/24/2017   Presence of  prosthetic heart valve 05/24/2017   Carotid artery disease ([place]) 08/18/2013   Peripheral arterial disease ([place]) 08/18/2013   Diabetes mellitus without complication ([place]) 69/62/9528   Hyponatremia 10/06/2011   Hypertension    Hypercholesteremia    Mechanical heart valve present    Chronic anticoagulation     is allergic to flagyl [metronidazole], ciprofloxacin, coreg [carvedilol], diflucan [fluconazole], losartan, privigen [immune globulin (human)], sulfamethoxazole-trimethoprim, verapamil, zetia [ezetimibe], zocor [simvastatin - high dose], and gatifloxacin.  MEDICAL HISTORY: Past Medical History:  Diagnosis Date   Aortic atherosclerosis ([place]) 01/23/2018   Atrial fibrillation (HCC)    Benign positional vertigo 10/06/2011   Carotid artery disease ([place])    Chemotherapy induced neutropenia ([place]) 07/21/2019   Cholelithiasis 01/23/2018   Cholelithiasis 01/23/2018   Chronic anticoagulation    Coronary artery disease    status post coronary artery bypass grafting times 07/10/2004   Diabetes mellitus    Hypercholesteremia    Hypertension    Mechanical heart valve present    H. aortic valve replacement at the time of bypass surgery October 2005   Moderate to severe pulmonary hypertension ([place])    Peripheral arterial disease ([place])    history of left common iliac artery PTA and stenting for a chronic total occlusion 08/26/01    SURGICAL HISTORY: Past Surgical History:  Procedure Laterality Date   anterior repair  2009   AORTIC VALVE REPLACEMENT  2005   Dr. Cyndia Bent   CARDIAC CATHETERIZATION  11/10/2004   40% right common illiac, 70% in stent restenosis of distal left common illiac,    CARDIAC CATHETERIZATION  05/18/2004   LAD 50-70% midstenosis, RCA dominant w/50% stenosis, 50% Right common Illiac artery ostial stenosis, 90% in stent restenosis  within midportion of left common illiac stent   Carotid Duplex  03/12/2012   RSA-elev. velocities suggestive of a 50-69% diameter reduction, Right&Left Bulb/Prox ICA-mild-mod.fibrous plaqueelevating Velocities abnormal study.   CHOLECYSTECTOMY N/A 03/01/2018   Procedure: LAPAROSCOPIC CHOLECYSTECTOMY;  Surgeon: Judeth Horn, MD;  Location: [facility];  Service: General;  Laterality: N/A;   COLONOSCOPY WITH PROPOFOL N/A 01/22/2018   Procedure: COLONOSCOPY WITH PROPOFOL;  Surgeon: Wilford Corner, MD;  Location: [facility];  Service: Endoscopy;  Laterality: N/A;   ESOPHAGOGASTRODUODENOSCOPY (EGD) WITH PROPOFOL N/A 01/22/2018   Procedure: ESOPHAGOGASTRODUODENOSCOPY (EGD) WITH PROPOFOL;  Surgeon: Wilford Corner, MD;  Location: [facility];  Service: Endoscopy;  Laterality: N/A;   ESOPHAGOGASTRODUODENOSCOPY (EGD) WITH PROPOFOL N/A 11/21/2018   Procedure: ESOPHAGOGASTRODUODENOSCOPY (EGD) WITH PROPOFOL;  Surgeon: [name], MD;  Location: [facility];  Service: Gastroenterology;  Laterality: N/A;   ESOPHAGOGASTRODUODENOSCOPY (EGD) WITH PROPOFOL Left 02/27/2019   Procedure: ESOPHAGOGASTRODUODENOSCOPY (EGD) WITH PROPOFOL;  Surgeon: Arta Silence, MD;  Location: [facility] ENDOSCOPY;  Service: Endoscopy;  Laterality: Left;   GIVENS CAPSULE STUDY N/A 11/21/2018   Procedure: GIVENS CAPSULE STUDY;  Surgeon: [name], MD;  Location: [employer];  Service: Gastroenterology;  Laterality: N/A;  To be deployed during EGD   Lower Ext. Duplex  03/12/2012   Right Proximal CIA- vessel narrowing w/elevated velocities 0-49% diameter reduction. Right SFA-mild mixed density plaque throughout vessel.   NM MYOCAR PERF WALL MOTION  05/19/2010   protocol: Persantine, post stress EF 65%, negative for ischemia, low risk scan   RIGHT HEART CATH N/A 06/27/2018   Procedure: RIGHT HEART CATH;  Surgeon: [name], MD;  Location: [facility];  Service: Cardiovascular;  Laterality: N/A;   TOTAL ABDOMINAL HYSTERECTOMY W/  BILATERAL SALPINGOOPHORECTOMY  1989   TRANSTHORACIC ECHOCARDIOGRAM  08/29/2012  Moderately calcified annulus of mitral valve, moderate regurg. of both mitral valve and tricuspid valve.     SOCIAL HISTORY: Social History   Socioeconomic History   Marital status: Widowed    Spouse name: Not on file   Number of children: Not on file   Years of education: Not on file   Highest education level: Not on file  Occupational History   Occupation: Retired  Tobacco Use   Smoking status: Former    Packs/day: 1.00    Years: 30.00    Pack years: 30.00    Types: Cigarettes   Smokeless tobacco: Never   Tobacco comments:    quit smoking 2005  Vaping Use   Vaping Use: Never used  Substance and Sexual Activity   Alcohol use: No    Alcohol/week: 0.0 standard drinks   Drug use: No   Sexual activity: Not Currently    Birth control/protection: None  Other Topics Concern   Not on file  Social History Narrative   Not on file   Social Determinants of Health   Financial Resource Strain: Not on file  Food Insecurity: No Food Insecurity   Worried About [place] in the Last Year: Never true   [place] in the Last Year: Never true  Transportation Needs: No Transportation Needs   Lack of Transportation (Medical): No   Lack of Transportation (Non-Medical): No  Physical Activity: Not on file  Stress: Not on file  Social Connections: Not on file  Intimate Partner Violence: Not on file    FAMILY HISTORY: Family History  Problem Relation Age of Onset   Heart disease Father    Hypertension Father    Hyperlipidemia Father    Breast cancer Neg Hx     Review of Systems  Constitutional:  Positive for fatigue. Negative for appetite change, chills, fever and unexpected weight change.  HENT:   Negative for hearing loss, lump/mass and trouble swallowing.   Eyes:  Negative for eye problems and icterus.  Respiratory:  Negative for chest tightness, cough and shortness of breath.    Cardiovascular:  Positive for leg swelling. Negative for chest pain and palpitations.  Gastrointestinal:  Positive for abdominal distention and diarrhea. Negative for abdominal pain, constipation, nausea and vomiting.  Endocrine: Negative for hot flashes.  Genitourinary:  Positive for dysuria and hematuria. Negative for difficulty urinating, vaginal bleeding and vaginal discharge.   Musculoskeletal:  Negative for arthralgias.  Skin:  Negative for itching and rash.  Neurological:  Negative for dizziness, extremity weakness, headaches and numbness.  Hematological:  Negative for adenopathy. Does not bruise/bleed easily.  Psychiatric/Behavioral:  Negative for depression. The patient is not nervous/anxious.      PHYSICAL EXAMINATION  ECOG PERFORMANCE STATUS: 3 - Symptomatic, >50% confined to bed  Vitals:   02/23/22 1442  BP: (!) 142/62  Pulse: 82  Resp: 18  Temp: 98.1 F (36.7 C)  SpO2: 99%    Physical Exam Constitutional:      General: She is not in acute distress.    Appearance: Normal appearance. She is not toxic-appearing.  HENT:     Head: Normocephalic and atraumatic.  Eyes:     General: No scleral icterus. Cardiovascular:     Rate and Rhythm: Normal rate and regular rhythm.     Pulses: Normal pulses.     Heart sounds: Murmur (systolic murmur, diastolic click) heard.  Pulmonary:     Effort: Pulmonary effort is normal.     Breath sounds:  Normal breath sounds.  Abdominal:     General: Abdomen is flat. Bowel sounds are normal. There is distension.     Palpations: Abdomen is soft.     Tenderness: There is no abdominal tenderness.  Musculoskeletal:        General: Swelling present.     Cervical back: Neck supple.  Lymphadenopathy:     Cervical: No cervical adenopathy.  Skin:    General: Skin is warm and dry.     Findings: No rash.  Neurological:     General: No focal deficit present.     Mental Status: She is alert.  Psychiatric:        Mood and Affect: Mood  normal.        Behavior: Behavior normal.    LABORATORY DATA:  CBC    Component Value Date/Time   WBC 4.3 02/23/2022 1430   WBC 3.7 (L) 02/17/2022 0820   RBC 2.93 (L) 02/23/2022 1430   RBC 2.99 (L) 02/23/2022 1430   HGB 9.5 (L) 02/23/2022 1430   HCT 28.3 (L) 02/23/2022 1430   HCT 28.5 (L) 11/20/2018 1824   PLT 144 (L) 02/23/2022 1430   MCV 94.6 02/23/2022 1430   MCH 31.8 02/23/2022 1430   MCHC 33.6 02/23/2022 1430   RDW 17.3 (H) 02/23/2022 1430   LYMPHSABS 0.2 (L) 02/23/2022 1430   MONOABS 0.1 02/23/2022 1430   EOSABS 0.0 02/23/2022 1430   BASOSABS 0.0 02/23/2022 1430    CMP     Component Value Date/Time   NA 126 (L) 02/23/2022 1430   NA 137 12/06/2017 1436   K 4.8 02/23/2022 1430   CL 96 (L) 02/23/2022 1430   CO2 20 (L) 02/23/2022 1430   GLUCOSE 556 (HH) 02/23/2022 1430   BUN 123 (H) 02/23/2022 1430   BUN 20 12/06/2017 1436   CREATININE 2.63 (H) 02/23/2022 1430   CALCIUM 8.0 (L) 02/23/2022 1430   PROT 5.5 (L) 02/23/2022 1430   ALBUMIN 3.2 (L) 02/23/2022 1430   AST 32 02/23/2022 1430   ALT 27 02/23/2022 1430   ALKPHOS 63 02/23/2022 1430   BILITOT 0.7 02/23/2022 1430   GFRNONAA 18 (L) 02/23/2022 1430   GFRAA 32 (L) 03/30/2020 0932     ASSESSMENT and THERAPY PLAN:   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 (HCC) Olia is here today for her refractory anemia.  She is doing moderately well.  I reviewed with her that her bone marrow biopsy does not show any primary bone marrow disorder that is causing her refractory anemia such as leukemia, or MDS.  She is having difficulty with the prednisone she is taking.  Today her glucose is 556.  She received 10 units of novolog today and I prescribed her a sliding scale of insulin as well. I reviewed her case with Dr. Lindi Adie and we discussed her labs in detail, her previous prednisone tapers, and Cellcept.  He recommended that she stop prednisone and continue on Cellcept as her hemoglobin was 9.5.  When I  discussed this with her we landed at an agreement of 30m per day.   Her urinalysis has not resulted, however if positive she tells me that previously she has tolerated macrobid without difficulty.     All questions were answered. The patient knows to call the clinic with any problems, questions or concerns. We can certainly see the patient much sooner if necessary.  Total encounter time:45 minutes*in face-to-face visit time, chart review, lab review, care coordination, order entry, and documentation of  the encounter time.   Wilber Bihari, NP 02/25/22 7:09 AM Medical Oncology and Hematology [facility] [place], [place] [address] Tel. [phone]    Fax. [phone]  *Total Encounter Time as defined by the Centers for Medicare and Medicaid Services includes, in addition to the face-to-face time of a patient visit (documented in the note above) non-face-to-face time: obtaining and reviewing outside history, ordering and reviewing medications, tests or procedures, care coordination (communications with other health care professionals or caregivers) and documentation in the medical record.

## 2022-02-23 NOTE — Telephone Encounter (Signed)
Received notice of glucose per lab draw today of 556. Pt is seeing [facility]/NP - information given.

## 2022-02-23 NOTE — Patient Instructions (Signed)
Blood Glucose Monitoring, Adult Monitoring your blood sugar (glucose) is an important part of managing your diabetes. Blood glucose monitoring involves checking your blood glucose as often as directed and keeping a log or record of your results over time. Checking your blood glucose regularly and keeping a blood glucose log can: Help you and your health care provider adjust your diabetes management plan as needed, including your medicines or insulin. Help you understand how food, exercise, illnesses, and medicines affect your blood glucose. Let you know what your blood glucose is at any time. You can quickly find out if you have low blood glucose (hypoglycemia) or high blood glucose (hyperglycemia). Your health care provider will set individualized treatment goals for you. Your goals will be based on your age, other medical conditions you have, and how you respond to diabetes treatment. Generally, the goal of treatment is to maintain the following blood glucose levels: Before meals (preprandial): 80-130 mg/dL (4.4-7.2 mmol/L). After meals (postprandial): below 180 mg/dL (10 mmol/L). A1C level: less than 7%. Supplies needed: Blood glucose meter. Test strips for your meter. Each meter has its own strips. You must use the strips that came with your meter. A needle to prick your finger (lancet). Do not use a lancet more than one time. A device that holds the lancet (lancing device). A journal or log book to write down your results. How to check your blood glucose Checking your blood glucose  Wash your hands for at least 20 seconds with soap and water. Prick the side of your finger (not the tip) with the lancet. Do not use the same finger consecutively. Gently rub the finger until a small drop of blood appears. Follow instructions that come with your meter for inserting the test strip, applying blood to the strip, and using your blood glucose meter. Write down your result and any notes in your  log. Using alternative sites Some meters allow you to use areas of your body other than your finger (alternative sites) to test your blood. The most common alternative sites are the forearm, the thigh, and the palm of your hand. Alternative sites may not be as accurate as the fingers because blood flow is slower in those areas. This means that the result you get may be delayed, and it may be different from the result that you would get from your finger. Use the finger only, and do not use alternative sites, if: You think you have hypoglycemia. You sometimes do not know that your blood glucose is getting low (hypoglycemia unawareness). General tips and recommendations Blood glucose log  Every time you check your blood glucose, write down your result. Also write down any notes about things that may be affecting your blood glucose, such as your diet and exercise for the day. This information can help you and your health care provider: Look for patterns in your blood glucose over time. Adjust your diabetes management plan as needed. Check if your meter allows you to download your records to a computer or if there is an app for the meter. Most glucose meters store a record of glucose readings in the meter. If you have type 1 diabetes: Check your blood glucose 4 or more times a day if you are on intensive insulin therapy with multiple daily injections (MDI) or if you are using an insulin pump. Check your blood glucose: Before every meal and snack. Before bedtime. Also check your blood glucose: If you have symptoms of hypoglycemia. After treating low blood glucose.  Before doing activities that create a risk for injury, like driving or using machinery. Before and after exercise. Two hours after a meal. Occasionally between 2:00 a.m. and 3:00 a.m., as directed. You may need to check your blood glucose more often, 6-10 times per day, if: You have diabetes that is not well controlled. You are  ill. You have a history of severe hypoglycemia. You have hypoglycemia unawareness. If you have type 2 diabetes: Check your blood glucose 2 or more times a day if you take insulin or other diabetes medicines. Check your blood glucose 4 or more times a day if you are on intensive insulin therapy. Occasionally, you may also need to check your glucose between 2:00 a.m. and 3:00 a.m., as directed. Also check your blood glucose: Before and after exercise. Before doing activities that create a risk for injury, like driving or using machinery. You may need to check your blood glucose more often if: Your medicine is being adjusted. Your diabetes is not well controlled. You are ill. General tips Make sure you always have your supplies with you. After you use a few boxes of test strips, adjust (calibrate) your blood glucose meter by following instructions that came with your meter. If you have questions or need help, all blood glucose meters have a 24-hour hotline phone number available that you can call. Also contact your health care provider with questions or concerns you may have. Where to find more information The American Diabetes Association: www.diabetes.org The Association of Diabetes Care & Education Specialists: www.diabeteseducator.org Contact a health care provider if: Your blood glucose is at or above 240 mg/dL (13.3 mmol/L) for 2 days in a row. You have been sick or have had a fever for 2 days or longer, and you are not getting better. You have any of the following problems for more than 6 hours: You cannot eat or drink. You have nausea or vomiting. You have diarrhea. Get help right away if: Your blood glucose is lower than 54 mg/dL (3 mmol/L). You become confused, or you have trouble thinking clearly. You have difficulty breathing. You have moderate or large ketone levels in your urine. These symptoms may represent a serious problem that is an emergency. Do not wait to see if the  symptoms will go away. Get medical help right away. Call your local emergency services (911 in the [place] Do not drive yourself to the hospital. Summary Monitoring your blood glucose is an important part of managing your diabetes. Blood glucose monitoring involves checking your blood glucose as often as directed and keeping a log or record of your results over time. Your health care provider will set individualized treatment goals for you. Your goals will be based on your age, other medical conditions you have, and how you respond to diabetes treatment. Every time you check your blood glucose, write down your result. Also, write down any notes about things that may be affecting your blood glucose, such as your diet and exercise for the day. This information is not intended to replace advice given to you by your health care provider. Make sure you discuss any questions you have with your health care provider. Document Revised: 06/23/2020 Document Reviewed: 06/23/2020 Elsevier Patient Education  [place].

## 2022-02-24 ENCOUNTER — Telehealth: Payer: Self-pay | Admitting: *Deleted

## 2022-02-24 ENCOUNTER — Other Ambulatory Visit: Payer: Self-pay

## 2022-02-24 ENCOUNTER — Other Ambulatory Visit: Payer: Self-pay | Admitting: *Deleted

## 2022-02-24 DIAGNOSIS — D594 Other nonautoimmune hemolytic anemias: Secondary | ICD-10-CM

## 2022-02-24 MED ORDER — "BD INSULIN SYRINGE ULTRAFINE 29G X 1/2"" 0.3 ML MISC": 1 refills | Status: DC

## 2022-02-24 NOTE — Telephone Encounter (Signed)
This RN spoke with [name] regarding need to increase her steroid to 20 mg daily.  Of note she has not yet picked up the needles called in ( RN verified with pharmacist availability for pick up at no cost to the pt ).  [name]'s last blood sugar reading was 230 at lunch and before she had taken her glipizide.  [name] states a friend will be picking up the needles later today.  Plan per discussion- [name] will get needles/syringes and follow sliding scale for this evening.  She will increase her prednisone to 20 mg in the am and continue until labs are rechecked and MD gives further instructions.  She will check her blood sugars before meals and use sliding scale per instructions.  [name] states she " just do not feel good- since starting that antibiotic and now my left hand is tingling "  She does not have any chest pain or sob " just really sleepy and want to crawl back into bed "  This RN informed her some of her symptoms may be related to the decrease in her steroid.  Per end of discussion [name] verbalized understanding if symptoms worsen she may need to proceed to the ER.  No further needs at this time.

## 2022-02-24 NOTE — Telephone Encounter (Addendum)
-----   Message from Benay Pike, MD sent at 02/23/2022  8:51 PM EDT ----- Val,  Please ask Ms Willets to cut down prednisone to 20 mg po daily. I cut it down to 25 last week and looks like she is maintaining Hb well.  Thanks  Per phone discussion with Tilia- she states she was told to decrease her prednisone to 10 mg daily until Monday.  She will check her blood sugars before meals and use insulin if needed per sliding scale.  She is hold the macrodantin due to severe nausea and dizziness - she took an ondansetron per this RN's request.

## 2022-02-25 ENCOUNTER — Encounter: Payer: Self-pay | Admitting: Adult Health

## 2022-02-25 ENCOUNTER — Encounter: Payer: Self-pay | Admitting: Hematology and Oncology

## 2022-02-25 LAB — URINE CULTURE

## 2022-02-25 NOTE — Assessment & Plan Note (Signed)
[name] is here today for her refractory anemia.  She is doing moderately well.  I reviewed with her that her bone marrow biopsy does not show any primary bone marrow disorder that is causing her refractory anemia such as leukemia, or MDS.  She is having difficulty with the prednisone she is taking.  Today her glucose is 556.  She received 10 units of novolog today and I prescribed her a sliding scale of insulin as well. I reviewed her case with Dr. Lindi Adie and we discussed her labs in detail, her previous prednisone tapers, and Cellcept.  He recommended that she stop prednisone and continue on Cellcept as her hemoglobin was 9.5.  When I discussed this with her we landed at an agreement of 30m per day.   Her urinalysis has not resulted, however if positive she tells me that previously she has tolerated macrobid without difficulty.

## 2022-02-27 ENCOUNTER — Telehealth: Payer: Self-pay | Admitting: Adult Health

## 2022-02-27 ENCOUNTER — Inpatient Hospital Stay: Payer: Medicare HMO

## 2022-02-27 ENCOUNTER — Other Ambulatory Visit: Payer: Self-pay

## 2022-02-27 ENCOUNTER — Inpatient Hospital Stay (HOSPITAL_BASED_OUTPATIENT_CLINIC_OR_DEPARTMENT_OTHER): Payer: Medicare HMO | Admitting: Adult Health

## 2022-02-27 ENCOUNTER — Encounter: Payer: Self-pay | Admitting: Adult Health

## 2022-02-27 VITALS — BP 148/71 | HR 55 | Temp 97.0°F | Resp 16 | Wt 138.7 lb

## 2022-02-27 DIAGNOSIS — R197 Diarrhea, unspecified: Secondary | ICD-10-CM | POA: Diagnosis not present

## 2022-02-27 DIAGNOSIS — D594 Other nonautoimmune hemolytic anemias: Secondary | ICD-10-CM

## 2022-02-27 DIAGNOSIS — I7782 Antineutrophilic cytoplasmic antibody (ANCA) vasculitis: Secondary | ICD-10-CM | POA: Diagnosis not present

## 2022-02-27 DIAGNOSIS — D591 Autoimmune hemolytic anemia, unspecified: Secondary | ICD-10-CM

## 2022-02-27 DIAGNOSIS — R102 Pelvic and perineal pain: Secondary | ICD-10-CM | POA: Diagnosis not present

## 2022-02-27 DIAGNOSIS — M8580 Other specified disorders of bone density and structure, unspecified site: Secondary | ICD-10-CM | POA: Diagnosis not present

## 2022-02-27 DIAGNOSIS — N939 Abnormal uterine and vaginal bleeding, unspecified: Secondary | ICD-10-CM | POA: Diagnosis not present

## 2022-02-27 DIAGNOSIS — N1832 Chronic kidney disease, stage 3b: Secondary | ICD-10-CM | POA: Diagnosis not present

## 2022-02-27 DIAGNOSIS — R739 Hyperglycemia, unspecified: Secondary | ICD-10-CM | POA: Diagnosis not present

## 2022-02-27 LAB — PROTIME-INR
INR: 1.6 — ABNORMAL HIGH (ref 0.8–1.2)
Prothrombin Time: 18.6 seconds — ABNORMAL HIGH (ref 11.4–15.2)

## 2022-02-27 LAB — RETIC PANEL
Immature Retic Fract: 7.5 % (ref 2.3–15.9)
RBC.: 2.93 MIL/uL — ABNORMAL LOW (ref 3.87–5.11)
Retic Count, Absolute: 85.6 10*3/uL (ref 19.0–186.0)
Retic Ct Pct: 2.9 % (ref 0.4–3.1)
Reticulocyte Hemoglobin: 34.9 pg (ref >27.9)

## 2022-02-27 LAB — URINALYSIS, COMPLETE (UACMP) WITH MICROSCOPIC
Bilirubin Urine: NEGATIVE
Glucose, UA: NEGATIVE mg/dL
Hgb urine dipstick: NEGATIVE
Ketones, ur: NEGATIVE mg/dL
Leukocytes,Ua: NEGATIVE
Nitrite: NEGATIVE
Protein, ur: 100 mg/dL — AB
Specific Gravity, Urine: 1.009 (ref 1.005–1.030)
pH: 5 (ref 5.0–8.0)

## 2022-02-27 LAB — CBC WITH DIFFERENTIAL (CANCER CENTER ONLY)
Abs Immature Granulocytes: 0.07 10*3/uL (ref 0.00–0.07)
Basophils Absolute: 0 10*3/uL (ref 0.0–0.1)
Basophils Relative: 0 %
Eosinophils Absolute: 0 10*3/uL (ref 0.0–0.5)
Eosinophils Relative: 0 %
HCT: 27.3 % — ABNORMAL LOW (ref 36.0–46.0)
Hemoglobin: 9.2 g/dL — ABNORMAL LOW (ref 12.0–15.0)
Immature Granulocytes: 1 %
Lymphocytes Relative: 7 %
Lymphs Abs: 0.4 10*3/uL — ABNORMAL LOW (ref 0.7–4.0)
MCH: 31.5 pg (ref 26.0–34.0)
MCHC: 33.7 g/dL (ref 30.0–36.0)
MCV: 93.5 fL (ref 80.0–100.0)
Monocytes Absolute: 0.4 10*3/uL (ref 0.1–1.0)
Monocytes Relative: 7 %
Neutro Abs: 4.2 10*3/uL (ref 1.7–7.7)
Neutrophils Relative %: 85 %
Platelet Count: 174 10*3/uL (ref 150–400)
RBC: 2.92 MIL/uL — ABNORMAL LOW (ref 3.87–5.11)
RDW: 16.5 % — ABNORMAL HIGH (ref 11.5–15.5)
WBC Count: 5 10*3/uL (ref 4.0–10.5)
nRBC: 0 % (ref 0.0–0.2)

## 2022-02-27 LAB — CMP (CANCER CENTER ONLY)
ALT: 25 U/L (ref 0–44)
AST: 30 U/L (ref 15–41)
Albumin: 3.3 g/dL — ABNORMAL LOW (ref 3.5–5.0)
Alkaline Phosphatase: 60 U/L (ref 38–126)
Anion gap: 8 (ref 5–15)
BUN: 121 mg/dL — ABNORMAL HIGH (ref 8–23)
CO2: 24 mmol/L (ref 22–32)
Calcium: 8.4 mg/dL — ABNORMAL LOW (ref 8.9–10.3)
Chloride: 99 mmol/L (ref 98–111)
Creatinine: 2.93 mg/dL — ABNORMAL HIGH (ref 0.44–1.00)
GFR, Estimated: 15 mL/min — ABNORMAL LOW (ref >60)
Glucose, Bld: 203 mg/dL — ABNORMAL HIGH (ref 70–99)
Potassium: 4.6 mmol/L (ref 3.5–5.1)
Sodium: 131 mmol/L — ABNORMAL LOW (ref 135–145)
Total Bilirubin: 0.5 mg/dL (ref 0.3–1.2)
Total Protein: 5.5 g/dL — ABNORMAL LOW (ref 6.5–8.1)

## 2022-02-27 NOTE — Telephone Encounter (Signed)
.  Called patient to schedule appointment per 5/20 inbasket, patient is aware of date and time.

## 2022-02-27 NOTE — Progress Notes (Signed)
[facility] Follow up:    [name], [name], MD [place] [address]   DIAGNOSIS: autoimmune hemolytic anemia  SUMMARY OF HEMATOLOGIC HISTORY: 81 y.o. [place] woman with multifactorial anemia, as follows:             (a) anemia of chronic inflammation: As of May 2020 she has a SED rate of 95, CRP 1.1, positive ANA and RF; subsequently she was found to be ANCA positive             (b) hemolysis (mild): she has a mildly elevated LDH and a DAT positive for complement, not IgG; this is c/w (a) above             (c) anemia of renal insufficiency: inadequate EPO resonse to anemia                         (d) GIB/ iron deficiency in patient on lifelong anticoagulaton   (1) Feraheme received 03/01/2019, repeated 03/20/2019              (a) reticulocyte 03/07/2019 up from 43.4 a year ago to 191.8 after iron infusion             (b) feraheme repeated on 05/12/2019             (c) subsequent ferritin levels >100   (2) darbepoetin: starting 05/05/2019 at 135mg given every 2 weeks             (a) dose increased to 200 mcg every week starting 07/01/2019              (b) back to every 2 weeks beginning 08/04/2019 still at 200 mcg dose             (c) switched to Retacrit every 4 weeks as of 08/18/2019   (3) ANCA positive vasculitis: diagnosed 05/08/2019 (titer 1:640)             (a) prednisone 40 mg daily started 05/31/2019             (b) rituximab weekly started 06/12/2019 (received test dose 06/05/2019)                         (i) hepatitis B sAg, core Ab and HIV negative 05/08/2019                         (ii) rituximab held after 07/14/2019 dose (received 5 weekly doses) with neutropenia                         (iii) rituximab resumed 08/18/2019 but changed to monthly                         (iv) rituximab changed to every 2 months beginning with 02/02/2020 dose                         (v) rituximab changed to every 12 weeks after the 10/12/2020 dose                          (Vi) rituximab restarted at weekly beginning 12/20/2021-01/30/2022 with no improvement in hemoglobin                         (  Vii) IVIG given on 02/06/2022 with poor tolerance              (c) prednisone tapered off 09/15/2019; restarted 12/13/2020             (D) bone marrow biopsy on 02/20/2022                (4) osteopenia with a T score of -1.5 on bone density 02/28/2016 at the [facility]  CURRENT THERAPY: prednisone, cellcept  INTERVAL HISTORY: [name] 81 y.o. female returns for close follow-up of her autoimmune hemolytic anemia.  At her visit last week she was noted to have what we were concerned was possibly early DKA with electrolyte changes and a sugar of 556.  She was given insulin in our clinic and her blood sugar improved down to the 400s we sent in a sliding scale which she is used over the weekend.  Her prednisone was tapered to 20 mg a day.  She notes her sugars have been more improved.  She is checking them 3 times a day and at bedtime and has noticed that they are usually in the 200s and are going into the 400s with less frequency.  She is using the sliding scale insulin without any issues or hypoglycemic episodes.    She took 1 dose of the Macrobid and became nauseated.  She has not taken this any further however her urine has cleared up.  She denies any more hematuria.  She does continue to have bilateral lower extremity edema with swelling and weeping of her legs.  She denies any fevers or chills.  Her hemoglobin today is 9.3.   Patient Active Problem List   Diagnosis Date Noted   Hyperkalemia 02/04/2022   Acute renal failure superimposed on stage 3b chronic kidney disease ([place]) 02/04/2022   Hyperglycemia 01/09/2022   Autoimmune hemolytic anemia (HCC) 01/02/2022   Bilateral lower extremity edema 01/02/2022   Refractory anemia (HCC) 12/15/2021   Abnormal chest x-ray 12/15/2021   Supratherapeutic INR 12/15/2021   Elevated troponin 12/15/2021    Unilateral primary osteoarthritis, right hip 05/31/2021   Midline cystocele 05/30/2021   Primary localized osteoarthritis of pelvic region and thigh 05/30/2021   Vaginal pain 05/30/2021   Asymmetric SNHL (sensorineural hearing loss) 01/06/2021   Referred otalgia of both ears 01/06/2021   Bilateral hearing loss 08/31/2020   Low back pain 02/20/2020   CKD (chronic kidney disease), stage IV ([place]) 10/13/2019   Leukopenia due to antineoplastic chemotherapy ([place]) 08/18/2019   Thrombophilia ([place]) 06/19/2019   Encounter for general adult medical examination without abnormal findings 06/13/2019   Arteritis ([place]) 06/06/2019   Hypertensive heart and chronic kidney disease with heart failure and stage 1 through stage 4 chronic kidney disease, or unspecified chronic kidney disease ([place]) 06/06/2019   Muscle weakness 06/06/2019   Pancytopenia ([place]) 05/28/2019   Dyspnea 05/28/2019   Vasculitis, ANCA positive 05/28/2019   Disorder of connective tissue ([place]) 05/14/2019   Renal insufficiency 05/08/2019   Acute on chronic diastolic CHF (congestive heart failure) ([place]) 04/07/2019   Left leg cellulitis 04/07/2019   PAF (paroxysmal atrial fibrillation) ([place]) 04/07/2019   Iron deficiency anemia 02/28/2019   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 ([place]) 02/28/2019   Diabetic retinopathy associated with type 2 diabetes mellitus ([place]) 09/02/2018   Chronic pain 06/19/2018   Non-thrombocytopenic purpura ([place]) 05/21/2018   Gout 03/22/2018   Malnutrition of moderate degree 03/12/2018   Diabetes mellitus type 2, controlled ([place]) 03/06/2018  S/P cholecystectomy 03/06/2018   HTN (hypertension) 03/06/2018   H/O mechanical aortic valve replacement 03/06/2018   Moderate to severe pulmonary hypertension ([place]) 03/06/2018   GERD (gastroesophageal reflux disease) 02/21/2018   Anxiety 02/21/2018   Chronic diastolic CHF (congestive heart failure) ([place]) 02/21/2018   Diarrhea 02/21/2018   General  weakness 02/02/2018   Thrombocytopenia ([place]) 01/25/2018   Arm pain, diffuse 01/25/2018   Anemia 01/23/2018   Aortic atherosclerosis ([place]) 01/23/2018   GI bleed 01/18/2018   Age-related osteoporosis without current pathological fracture 05/24/2017   Carotid artery occlusion 05/24/2017   Generalized anxiety disorder 05/24/2017   Hearing loss 05/24/2017   Presence of prosthetic heart valve 05/24/2017   Carotid artery disease ([place]) 08/18/2013   Peripheral arterial disease ([place]) 08/18/2013   Diabetes mellitus without complication ([place]) 22/48/2500   Hyponatremia 10/06/2011   Hypertension    Hypercholesteremia    Mechanical heart valve present    Chronic anticoagulation     is allergic to flagyl [metronidazole], ciprofloxacin, coreg [carvedilol], diflucan [fluconazole], losartan, privigen [immune globulin (human)], sulfamethoxazole-trimethoprim, verapamil, zetia [ezetimibe], zocor [simvastatin - high dose], and gatifloxacin.  MEDICAL HISTORY: Past Medical History:  Diagnosis Date   Aortic atherosclerosis ([place]) 01/23/2018   Atrial fibrillation (HCC)    Benign positional vertigo 10/06/2011   Carotid artery disease ([place])    Chemotherapy induced neutropenia ([place]) 07/21/2019   Cholelithiasis 01/23/2018   Cholelithiasis 01/23/2018   Chronic anticoagulation    Coronary artery disease    status post coronary artery bypass grafting times 07/10/2004   Diabetes mellitus    Hypercholesteremia    Hypertension    Mechanical heart valve present    H. aortic valve replacement at the time of bypass surgery October 2005   Moderate to severe pulmonary hypertension ([employer])    Peripheral arterial disease ([place])    history of left common iliac artery PTA and stenting for a chronic total occlusion 08/26/01    SURGICAL HISTORY: Past Surgical History:  Procedure Laterality Date   anterior repair  2009   AORTIC VALVE REPLACEMENT  2005   Dr. Cyndia Bent   CARDIAC CATHETERIZATION  11/10/2004   40% right  common illiac, 70% in stent restenosis of distal left common illiac,    CARDIAC CATHETERIZATION  05/18/2004   LAD 50-70% midstenosis, RCA dominant w/50% stenosis, 50% Right common Illiac artery ostial stenosis, 90% in stent restenosis within midportion of left common illiac stent   Carotid Duplex  03/12/2012   RSA-elev. velocities suggestive of a 50-69% diameter reduction, Right&Left Bulb/Prox ICA-mild-mod.fibrous plaqueelevating Velocities abnormal study.   CHOLECYSTECTOMY N/A 03/01/2018   Procedure: LAPAROSCOPIC CHOLECYSTECTOMY;  Surgeon: Judeth Horn, MD;  Location: [facility];  Service: General;  Laterality: N/A;   COLONOSCOPY WITH PROPOFOL N/A 01/22/2018   Procedure: COLONOSCOPY WITH PROPOFOL;  Surgeon: Wilford Corner, MD;  Location: [facility];  Service: Endoscopy;  Laterality: N/A;   ESOPHAGOGASTRODUODENOSCOPY (EGD) WITH PROPOFOL N/A 01/22/2018   Procedure: ESOPHAGOGASTRODUODENOSCOPY (EGD) WITH PROPOFOL;  Surgeon: Wilford Corner, MD;  Location: [facility];  Service: Endoscopy;  Laterality: N/A;   ESOPHAGOGASTRODUODENOSCOPY (EGD) WITH PROPOFOL N/A 11/21/2018   Procedure: ESOPHAGOGASTRODUODENOSCOPY (EGD) WITH PROPOFOL;  Surgeon: [name], MD;  Location: [facility];  Service: Gastroenterology;  Laterality: N/A;   ESOPHAGOGASTRODUODENOSCOPY (EGD) WITH PROPOFOL Left 02/27/2019   Procedure: ESOPHAGOGASTRODUODENOSCOPY (EGD) WITH PROPOFOL;  Surgeon: Arta Silence, MD;  [facility]COPY;  Service: Endoscopy;  Laterality: Left;   GIVENS CAPSULE STUDY N/A 11/21/2018   Procedure: GIVENS CAPSULE STUDY;  Surgeon: [name], MD;  Location: [facility]  ENDOSCOPY;  Service: Gastroenterology;  Laterality: N/A;  To be deployed during EGD   Lower Ext. Duplex  03/12/2012   Right Proximal CIA- vessel narrowing w/elevated velocities 0-49% diameter reduction. Right SFA-mild mixed density plaque throughout vessel.   NM MYOCAR PERF WALL MOTION  05/19/2010   protocol: Persantine, post stress EF 65%, negative  for ischemia, low risk scan   RIGHT HEART CATH N/A 06/27/2018   Procedure: RIGHT HEART CATH;  Surgeon: [name], MD;  Location: [facility];  Service: Cardiovascular;  Laterality: N/A;   TOTAL ABDOMINAL HYSTERECTOMY W/ BILATERAL SALPINGOOPHORECTOMY  1989   TRANSTHORACIC ECHOCARDIOGRAM  08/29/2012   Moderately calcified annulus of mitral valve, moderate regurg. of both mitral valve and tricuspid valve.     SOCIAL HISTORY: Social History   Socioeconomic History   Marital status: Widowed    Spouse name: Not on file   Number of children: Not on file   Years of education: Not on file   Highest education level: Not on file  Occupational History   Occupation: Retired  Tobacco Use   Smoking status: Former    Packs/day: 1.00    Years: 30.00    Pack years: 30.00    Types: Cigarettes   Smokeless tobacco: Never   Tobacco comments:    quit smoking 2005  Vaping Use   Vaping Use: Never used  Substance and Sexual Activity   Alcohol use: No    Alcohol/week: 0.0 standard drinks   Drug use: No   Sexual activity: Not Currently    Birth control/protection: None  Other Topics Concern   Not on file  Social History Narrative   Not on file   Social Determinants of Health   Financial Resource Strain: Not on file  Food Insecurity: No Food Insecurity   Worried About [place] in the Last Year: Never true   [place] in the Last Year: Never true  Transportation Needs: No Transportation Needs   Lack of Transportation (Medical): No   Lack of Transportation (Non-Medical): No  Physical Activity: Not on file  Stress: Not on file  Social Connections: Not on file  Intimate Partner Violence: Not on file    FAMILY HISTORY: Family History  Problem Relation Age of Onset   Heart disease Father    Hypertension Father    Hyperlipidemia Father    Breast cancer Neg Hx     Review of Systems  Constitutional:  Negative for appetite change, chills, fatigue, fever and  unexpected weight change.  HENT:   Negative for hearing loss, lump/mass and trouble swallowing.   Eyes:  Negative for eye problems and icterus.  Respiratory:  Negative for chest tightness, cough and shortness of breath.   Cardiovascular:  Positive for leg swelling. Negative for chest pain and palpitations.  Gastrointestinal:  Negative for abdominal distention, abdominal pain, constipation, diarrhea, nausea and vomiting.  Endocrine: Negative for hot flashes.  Genitourinary:  Negative for difficulty urinating.   Musculoskeletal:  Negative for arthralgias.  Skin:  Negative for itching and rash.  Neurological:  Negative for dizziness, extremity weakness, headaches and numbness.  Hematological:  Negative for adenopathy. Does not bruise/bleed easily.  Psychiatric/Behavioral:  Negative for depression. The patient is not nervous/anxious.      PHYSICAL EXAMINATION  ECOG PERFORMANCE STATUS: 2 - Symptomatic, <50% confined to bed  Vitals:   02/27/22 1120  BP: (!) 148/71  Pulse: (!) 55  Resp: 16  Temp: (!) 97 F (36.1 C)  SpO2: 99%    Physical Exam Constitutional:      General: She is not in acute distress.    Appearance: Normal appearance. She is not toxic-appearing.  HENT:     Head: Normocephalic and atraumatic.  Eyes:     General: No scleral icterus. Cardiovascular:     Rate and Rhythm: Normal rate and regular rhythm.     Pulses: Normal pulses.     Heart sounds: Murmur (click) heard.  Pulmonary:     Effort: Pulmonary effort is normal.     Breath sounds: Normal breath sounds.  Abdominal:     General: Abdomen is flat. Bowel sounds are normal. There is no distension.     Palpations: Abdomen is soft.     Tenderness: There is no abdominal tenderness.  Musculoskeletal:        General: Swelling present.     Cervical back: Neck supple.  Lymphadenopathy:     Cervical: No cervical adenopathy.  Skin:    General: Skin is warm and dry.     Findings: No rash.  Neurological:      General: No focal deficit present.     Mental Status: She is alert.  Psychiatric:        Mood and Affect: Mood normal.        Behavior: Behavior normal.    LABORATORY DATA:  CBC    Component Value Date/Time   WBC 5.0 02/27/2022 1058   WBC 3.7 (L) 02/17/2022 0820   RBC 2.93 (L) 02/27/2022 1059   RBC 2.92 (L) 02/27/2022 1058   HGB 9.2 (L) 02/27/2022 1058   HCT 27.3 (L) 02/27/2022 1058   HCT 28.5 (L) 11/20/2018 1824   PLT 174 02/27/2022 1058   MCV 93.5 02/27/2022 1058   MCH 31.5 02/27/2022 1058   MCHC 33.7 02/27/2022 1058   RDW 16.5 (H) 02/27/2022 1058   LYMPHSABS 0.4 (L) 02/27/2022 1058   MONOABS 0.4 02/27/2022 1058   EOSABS 0.0 02/27/2022 1058   BASOSABS 0.0 02/27/2022 1058    CMP     Component Value Date/Time   NA 131 (L) 02/27/2022 1058   NA 137 12/06/2017 1436   K 4.6 02/27/2022 1058   CL 99 02/27/2022 1058   CO2 24 02/27/2022 1058   GLUCOSE 203 (H) 02/27/2022 1058   BUN 121 (H) 02/27/2022 1058   BUN 20 12/06/2017 1436   CREATININE 2.93 (H) 02/27/2022 1058   CALCIUM 8.4 (L) 02/27/2022 1058   PROT 5.5 (L) 02/27/2022 1058   ALBUMIN 3.3 (L) 02/27/2022 1058   AST 30 02/27/2022 1058   ALT 25 02/27/2022 1058   ALKPHOS 60 02/27/2022 1058   BILITOT 0.5 02/27/2022 1058   GFRNONAA 15 (L) 02/27/2022 1058   GFRAA 32 (L) 03/30/2020 0932    ASSESSMENT and THERAPY PLAN:   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 (HCC) Emmaclaire is here today for her refractory anemia.  Her hemoglobin has been stable on 20 mg of prednisone daily therefore we are decreasing this now to 15 mg/day.  She met with Dr. Chryl Heck today to discuss this in further detail.  Her blood sugar is improved to 200 today it was 556 last Thursday.  She will continue to use the sliding scale of insulin as needed in addition to her Tyler Aas insulin that is long-acting.  She no longer has a urinary tract infection notable on her urinalysis.  We will continue to follow her for any symptoms  however those appear to have resolved as well.  She will not need any further antibiotics as a urine culture is pending.  Suhaylah will return on Friday for labs and follow-up.    All questions were answered. The patient knows to call the clinic with any problems, questions or concerns. We can certainly see the patient much sooner if necessary.    Wilber Bihari, NP 02/27/22 12:37 PM Medical Oncology and Hematology [facility] [place], [place] [address] Tel. [phone]    Fax. [phone]  I saw the patient, examined the patient and discussed plan of care Will continue prednisone taper. No evidence of hemolysis BG better controlled today UA with no evidence of UTI RTC on Friday for repeat labs. Plan to taper to 15 mg prednisone for 5 days, followed by 10 mg for 5 days followed by 5 mg for 5 days.  Continue mycophenolate as prescribed Total time spent: 30 minutes  *Total Encounter Time as defined by the Centers for Medicare and Medicaid Services includes, in addition to the face-to-face time of a patient visit (documented in the note above) non-face-to-face time: obtaining and reviewing outside history, ordering and reviewing medications, tests or procedures, care coordination (communications with other health care professionals or caregivers) and documentation in the medical record.

## 2022-02-27 NOTE — Patient Outreach (Signed)
[facility]) Care Management  02/27/2022  [name] April 03, 1941 [id number]   Telephone call to patient for follow up. No answer. HIPAA compliant voice message left.    Plan: RN CM will attempt patient again within 4 business days.  [name], RN, MSN [facility] Care Management Care Management Coordinator Direct Line [phone] Toll Free: [phone]  Fax: [phone]

## 2022-02-27 NOTE — Assessment & Plan Note (Signed)
[name] is here today for her refractory anemia.  Her hemoglobin has been stable on 20 mg of prednisone daily therefore we are decreasing this now to 15 mg/day.  She met with Dr. Chryl Heck today to discuss this in further detail.  Her blood sugar is improved to 200 today it was 556 last Thursday.  She will continue to use the sliding scale of insulin as needed in addition to her Tyler Aas insulin that is long-acting.  She no longer has a urinary tract infection notable on her urinalysis.  We will continue to follow her for any symptoms however those appear to have resolved as well.  She will not need any further antibiotics as a urine culture is pending.  [name] will return on Friday for labs and follow-up.

## 2022-02-28 ENCOUNTER — Telehealth: Payer: Self-pay

## 2022-02-28 ENCOUNTER — Other Ambulatory Visit: Payer: Self-pay

## 2022-02-28 ENCOUNTER — Telehealth: Payer: Self-pay | Admitting: Adult Health

## 2022-02-28 ENCOUNTER — Encounter: Payer: Self-pay | Admitting: Hematology and Oncology

## 2022-02-28 NOTE — Patient Instructions (Addendum)
Patient Goals/Self-Care Activities: Anemia Take all medications as prescribed Attend all scheduled provider appointments Notify physician for any changes in condition such as shortness of breath and increased fatigue.   Pace self with activities Eat iron rich foods such as red meat, salmon, breakfast cereal with iron.     Patient Goals/Self-Care Activities: Heart Failure and Diabetes Take all medications as prescribed Attend all scheduled provider appointments call office if I gain more than 2 pounds in one day or 5 pounds in one week track weight in diary use salt in moderation watch for swelling in feet, ankles and legs every day weigh myself daily follow rescue plan if symptoms flare-up check blood sugar at prescribed times: twice daily enter blood sugar readings and medication or insulin into daily log take the blood sugar log to all doctor visits manage portion size Limit food high in carbohydrates such as rice, bread, potatoes, pasta

## 2022-02-28 NOTE — Telephone Encounter (Signed)
Called Dr Shelva Majestic office per MD regarding trending renal fxn. Dr Chryl Heck would like to know plan regarding renal fxn. Spoke with Charise, CMA and she verbalized she will fax over most recent progress note.

## 2022-02-28 NOTE — Telephone Encounter (Signed)
Scheduled appointment per 5/22 los. Patient is aware of her upcoming appointment.

## 2022-02-28 NOTE — Patient Outreach (Signed)
[facility]) Care Management  02/28/2022  [name] [birth year] [id number]   Patient reports doing very well today.  No transfusions and hemoglobin stable at 9.2.  Patient reports sugars are all over the place due to prednisone. Patient using sliding scale insulin.  [place] 137 lbs this morning.  Encouraged continues diabetes and heart failure management.   Care Plan : RN CM Plan of Care  Updates made by Jon Billings, RN since 02/28/2022 12:00 AM     Problem: Chronic disease management and care coordination needs of HF, DM, and Anemia   Priority: High     Long-Range Goal: Development of plan of care for management of HF and DM   Start Date: 01/13/2022  Expected End Date: 10/08/2022  This Visit's Progress: On track  Recent Progress: On track  Priority: High  Note:   Current Barriers:  Chronic Disease Management support and education needs related to CHF and DMII   RNCM Clinical Goal(s):  Patient will verbalize basic understanding of  CHF and DMII disease process and self health management plan as evidenced by No acute exacerbation of heart failure and blood sugars less than 300 continue to work with [place] to address care management and care coordination needs related to  CHF and DMII as evidenced by adherence to CM Team Scheduled appointments through collaboration with RN Care manager, provider, and care team.   Interventions: Education and support related to heart failure and diabetes Inter-disciplinary care team collaboration (see longitudinal plan of care) Evaluation of current treatment plan related to  self management and patient's adherence to plan as established by provider   Heart Failure Interventions:  (Status:  Goal on track:  Yes.) Long Term Goal Basic overview and discussion of pathophysiology of Heart Failure reviewed Provided education on low sodium diet Discussed importance of daily weight and advised patient to weigh and record  daily  Diabetes Interventions:  (Status:  New goal.) Long Term Goal Assessed patient's understanding of A1c goal: <7% Provided education to patient about basic DM disease process Reviewed medications with patient and discussed importance of medication adherence Discussed plans with patient for ongoing care management follow up and provided patient with direct contact information for care management team Discussed importance of monitoring carbohydrates and sugars.  Lab Results  Component Value Date   HGBA1C 4.6 (L) 02/24/2019  01/13/22 Patient having some problems with her anemia.  She is on prednisone and has had a blood transfusion.  Patient reports she is now on tresiba for her sugars 14 units daily.  Patient reports blood sugar however this morning was 253.  She is concerned with highs and will be calling physician to reports sugars and next steps.   Discussed importance of tight carbohydrate control to help with her sugars.  Weight up to 131 lbs. However, due to patient on prednisone explains weight gain.  CM reiterated signs of heart failure.    01/31/22 Patient continues to deal with her anemia.  However, prednisone has started to be tapered. Patient working with pharmacist to adjust [name] accordingly.  Patient reports highest sugar was 369 in the last 7-8 days.  Discussed affects of prednisone on sugars.  Patient weight today 127 lbs.  Patient continues with heart failure management such as no salt, daily weights, and watching for swelling.    02/13/22 Patient continues with diabetes management.  Blood sugar continues to be up and down due to prednisone.  Discussed tight carbohydrate control.  Patient weight up  since hospitalization due to blood transfusion and IV fluids.  Most recent weight 138 lbs.  Patient reports some swelling to legs.  Encouraged to limit salt intake and continue daily weights.    02/28/22 Patient continues on prednisone causing her sugars to be irradict.  Patient on sliding  scale now.  Discussed importance of continued diabetes management.  Patient weight at 137 lbs.  Heart failure management continues.    Patient Goals/Self-Care Activities: Heart Failure and Diabetes Take all medications as prescribed Attend all scheduled provider appointments call office if I gain more than 2 pounds in one day or 5 pounds in one week track weight in diary use salt in moderation watch for swelling in feet, ankles and legs every day weigh myself daily follow rescue plan if symptoms flare-up check blood sugar at prescribed times: twice daily enter blood sugar readings and medication or insulin into daily log take the blood sugar log to all doctor visits manage portion size Limit food high in carbohydrates such as rice, bread, potatoes, pasta  Follow Up Plan:  Telephone follow up appointment with care management team member scheduled for:  June The patient has been provided with contact information for the care management team and has been advised to call with any health related questions or concerns.      Long-Range Goal: Development of plan of care for management of anemia   Start Date: 02/13/2022  Expected End Date: 10/08/2022  This Visit's Progress: On track  Priority: High  Note:   Current Barriers:  Chronic Disease Management support and education needs related to Anemia   RNCM Clinical Goal(s):  Patient will verbalize basic understanding of  Anemia disease process and self health management plan as evidenced by hemoglobin staying above 7.0  through collaboration with RN Care manager, provider, and care team.   Interventions: Education and support related to anemia Inter-disciplinary care team collaboration (see longitudinal plan of care) Evaluation of current treatment plan related to  self management and patient's adherence to plan as established by provider   Anemia  (Status:  Goal on track:  Yes.)  Long Term Goal Evaluation of current treatment plan  related to  Anemia ,  self-management and patient's adherence to plan as established by provider. Discussed plans with patient for ongoing care management follow up and provided patient with direct contact information for care management team Evaluation of current treatment plan related to anemia and patient's adherence to plan as established by provider Provided education to patient re: Iron rich foods and importance of follow up with physician Discussed plans with patient for ongoing care management follow up and provided patient with direct contact information for care management team  Patient Goals/Self-Care Activities: Anemia Take all medications as prescribed Attend all scheduled provider appointments Notify physician for any changes in condition such as shortness of breath and increased fatigue.   Pace self with activities Eat iron rich foods such as red meat, salmon, breakfast cereal with iron.    02/28/22 Patient reports doing very well.  Pleased with blood work on yesterday hemoglobin 9.2 and no transfusion needed.  Encouraged to eat protein and iron rich foods as she can.  Remains on prednisone but 15 mg this week per patient.  No concerns.     Follow Up Plan:  Telephone follow up appointment with care management team member scheduled for:  June The patient has been provided with contact information for the care management team and has been advised to call with any  health related questions or concerns.      Plan: Follow-up: Patient agrees to Care Plan and Follow-up. Follow-up in June.  [name], RN, MSN [facility] Care Management Care Management Coordinator Direct Line [phone] Toll Free: [phone]  Fax: [phone]

## 2022-03-01 ENCOUNTER — Encounter (HOSPITAL_COMMUNITY): Payer: Self-pay | Admitting: Hematology and Oncology

## 2022-03-01 LAB — URINE CULTURE: Culture: 10000 — AB

## 2022-03-03 ENCOUNTER — Inpatient Hospital Stay: Payer: Medicare HMO

## 2022-03-03 ENCOUNTER — Other Ambulatory Visit: Payer: Self-pay

## 2022-03-03 ENCOUNTER — Ambulatory Visit (INDEPENDENT_AMBULATORY_CARE_PROVIDER_SITE_OTHER): Payer: Medicare HMO | Admitting: *Deleted

## 2022-03-03 ENCOUNTER — Inpatient Hospital Stay (HOSPITAL_BASED_OUTPATIENT_CLINIC_OR_DEPARTMENT_OTHER): Payer: Medicare HMO | Admitting: Adult Health

## 2022-03-03 ENCOUNTER — Encounter: Payer: Self-pay | Admitting: Adult Health

## 2022-03-03 ENCOUNTER — Other Ambulatory Visit (HOSPITAL_COMMUNITY): Payer: Self-pay

## 2022-03-03 VITALS — BP 167/68 | HR 77 | Temp 98.2°F | Resp 18 | Ht 59.0 in | Wt 139.6 lb

## 2022-03-03 DIAGNOSIS — M8580 Other specified disorders of bone density and structure, unspecified site: Secondary | ICD-10-CM | POA: Diagnosis not present

## 2022-03-03 DIAGNOSIS — Z7901 Long term (current) use of anticoagulants: Secondary | ICD-10-CM

## 2022-03-03 DIAGNOSIS — D464 Refractory anemia, unspecified: Secondary | ICD-10-CM | POA: Diagnosis not present

## 2022-03-03 DIAGNOSIS — D591 Autoimmune hemolytic anemia, unspecified: Secondary | ICD-10-CM

## 2022-03-03 DIAGNOSIS — R197 Diarrhea, unspecified: Secondary | ICD-10-CM | POA: Diagnosis not present

## 2022-03-03 DIAGNOSIS — R102 Pelvic and perineal pain: Secondary | ICD-10-CM | POA: Diagnosis not present

## 2022-03-03 DIAGNOSIS — Z952 Presence of prosthetic heart valve: Secondary | ICD-10-CM

## 2022-03-03 DIAGNOSIS — D594 Other nonautoimmune hemolytic anemias: Secondary | ICD-10-CM

## 2022-03-03 DIAGNOSIS — N939 Abnormal uterine and vaginal bleeding, unspecified: Secondary | ICD-10-CM | POA: Diagnosis not present

## 2022-03-03 DIAGNOSIS — N1832 Chronic kidney disease, stage 3b: Secondary | ICD-10-CM | POA: Diagnosis not present

## 2022-03-03 DIAGNOSIS — I7782 Antineutrophilic cytoplasmic antibody (ANCA) vasculitis: Secondary | ICD-10-CM | POA: Diagnosis not present

## 2022-03-03 DIAGNOSIS — R739 Hyperglycemia, unspecified: Secondary | ICD-10-CM | POA: Diagnosis not present

## 2022-03-03 LAB — CBC WITH DIFFERENTIAL (CANCER CENTER ONLY)
Abs Immature Granulocytes: 0.1 10*3/uL — ABNORMAL HIGH (ref 0.00–0.07)
Basophils Absolute: 0 10*3/uL (ref 0.0–0.1)
Basophils Relative: 0 %
Eosinophils Absolute: 0 10*3/uL (ref 0.0–0.5)
Eosinophils Relative: 0 %
HCT: 28.1 % — ABNORMAL LOW (ref 36.0–46.0)
Hemoglobin: 9.5 g/dL — ABNORMAL LOW (ref 12.0–15.0)
Immature Granulocytes: 2 %
Lymphocytes Relative: 4 %
Lymphs Abs: 0.2 10*3/uL — ABNORMAL LOW (ref 0.7–4.0)
MCH: 32 pg (ref 26.0–34.0)
MCHC: 33.8 g/dL (ref 30.0–36.0)
MCV: 94.6 fL (ref 80.0–100.0)
Monocytes Absolute: 0.1 10*3/uL (ref 0.1–1.0)
Monocytes Relative: 2 %
Neutro Abs: 5.8 10*3/uL (ref 1.7–7.7)
Neutrophils Relative %: 92 %
Platelet Count: 145 10*3/uL — ABNORMAL LOW (ref 150–400)
RBC: 2.97 MIL/uL — ABNORMAL LOW (ref 3.87–5.11)
RDW: 16.8 % — ABNORMAL HIGH (ref 11.5–15.5)
WBC Count: 6.2 10*3/uL (ref 4.0–10.5)
nRBC: 0 % (ref 0.0–0.2)

## 2022-03-03 LAB — CMP (CANCER CENTER ONLY)
ALT: 39 U/L (ref 0–44)
AST: 55 U/L — ABNORMAL HIGH (ref 15–41)
Albumin: 3.5 g/dL (ref 3.5–5.0)
Alkaline Phosphatase: 70 U/L (ref 38–126)
Anion gap: 10 (ref 5–15)
BUN: 113 mg/dL — ABNORMAL HIGH (ref 8–23)
CO2: 22 mmol/L (ref 22–32)
Calcium: 8.8 mg/dL — ABNORMAL LOW (ref 8.9–10.3)
Chloride: 99 mmol/L (ref 98–111)
Creatinine: 2.33 mg/dL — ABNORMAL HIGH (ref 0.44–1.00)
GFR, Estimated: 20 mL/min — ABNORMAL LOW (ref >60)
Glucose, Bld: 331 mg/dL — ABNORMAL HIGH (ref 70–99)
Potassium: 4.6 mmol/L (ref 3.5–5.1)
Sodium: 131 mmol/L — ABNORMAL LOW (ref 135–145)
Total Bilirubin: 0.6 mg/dL (ref 0.3–1.2)
Total Protein: 5.8 g/dL — ABNORMAL LOW (ref 6.5–8.1)

## 2022-03-03 LAB — PROTIME-INR
INR: 1.6 — ABNORMAL HIGH (ref 0.8–1.2)
Prothrombin Time: 18.5 seconds — ABNORMAL HIGH (ref 11.4–15.2)

## 2022-03-03 NOTE — Assessment & Plan Note (Signed)
[name] is here today for her refractory anemia.  Her hemoglobin remained stable at 9.5 today.  She will continue prednisone at 15 mg daily and also the CellCept daily.    Her blood sugars are improved from prior and she will continue the sliding scale insulin as indicated and prescribed.  She wanted her INR drawn and so I added this on for her today we will forward the results to her cardiology clinic that is managing it.  We will see her back on Tuesday, May 30 for labs and follow-up.

## 2022-03-03 NOTE — Progress Notes (Signed)
[facility] Follow up:    [name], [name], MD [place] [address]   DIAGNOSIS: Hemolytic anemia  SUMMARY OF HEMATOLOGIC HISTORY:  81 y.o. [place] woman with multifactorial anemia, as follows:             (a) anemia of chronic inflammation: As of May 2020 she has a SED rate of 95, CRP 1.1, positive ANA and RF; subsequently she was found to be ANCA positive             (b) hemolysis (mild): she has a mildly elevated LDH and a DAT positive for complement, not IgG; this is c/w (a) above             (c) anemia of renal insufficiency: inadequate EPO resonse to anemia                         (d) GIB/ iron deficiency in patient on lifelong anticoagulaton   (1) Feraheme received 03/01/2019, repeated 03/20/2019              (a) reticulocyte 03/07/2019 up from 43.4 a year ago to 191.8 after iron infusion             (b) feraheme repeated on 05/12/2019             (c) subsequent ferritin levels >100   (2) darbepoetin: starting 05/05/2019 at 170mg given every 2 weeks             (a) dose increased to 200 mcg every week starting 07/01/2019              (b) back to every 2 weeks beginning 08/04/2019 still at 200 mcg dose             (c) switched to Retacrit every 4 weeks as of 08/18/2019   (3) ANCA positive vasculitis: diagnosed 05/08/2019 (titer 1:640)             (a) prednisone 40 mg daily started 05/31/2019             (b) rituximab weekly started 06/12/2019 (received test dose 06/05/2019)                         (i) hepatitis B sAg, core Ab and HIV negative 05/08/2019                         (ii) rituximab held after 07/14/2019 dose (received 5 weekly doses) with neutropenia                         (iii) rituximab resumed 08/18/2019 but changed to monthly                         (iv) rituximab changed to every 2 months beginning with 02/02/2020 dose                         (v) rituximab changed to every 12 weeks after the 10/12/2020 dose                          (Vi) rituximab restarted at weekly beginning 12/20/2021-01/30/2022 with no improvement in hemoglobin                         (  Vii) IVIG given on 02/06/2022 with poor tolerance              (c) prednisone tapered off 09/15/2019; restarted 12/13/2020             (D) bone marrow biopsy on 02/20/2022                (4) osteopenia with a T score of -1.5 on bone density 02/28/2016 at the breast center  CURRENT THERAPY: Prednisone and CellCept  INTERVAL HISTORY: [name] 81 y.o. female returns for follow up of her refractory anemia.  She is doing moderately well.  She is taking Prednisone 40m daily which she started on Tuesday.  Her hemoglobin is 9.5 today.  Her blood sugars have improved also, she notes they have not been in the 400s over the past three days.  Overall she is feeling better and denies any significant issues today.   Patient Active Problem List   Diagnosis Date Noted   Hyperkalemia 02/04/2022   Acute renal failure superimposed on stage 3b chronic kidney disease ([facility] 02/04/2022   Hyperglycemia 01/09/2022   Autoimmune hemolytic anemia (HCC) 01/02/2022   Bilateral lower extremity edema 01/02/2022   Refractory anemia (H[place] 12/15/2021   Abnormal chest x-ray 12/15/2021   Supratherapeutic INR 12/15/2021   Elevated troponin 12/15/2021   Unilateral primary osteoarthritis, right hip 05/31/2021   Midline cystocele 05/30/2021   Primary localized osteoarthritis of pelvic region and thigh 05/30/2021   Vaginal pain 05/30/2021   Asymmetric SNHL (sensorineural hearing loss) 01/06/2021   Referred otalgia of both ears 01/06/2021   Bilateral hearing loss 08/31/2020   Low back pain 02/20/2020   CKD (chronic kidney disease), stage IV (H[place] 10/13/2019   Leukopenia due to antineoplastic chemotherapy (HC[place] 08/18/2019   Thrombophilia ([facility] 06/19/2019   Encounter for general adult medical examination without abnormal findings 06/13/2019   Arteritis (HP[place] 06/06/2019   Hypertensive  heart and chronic kidney disease with heart failure and stage 1 through stage 4 chronic kidney disease, or unspecified chronic kidney disease (H[place] 06/06/2019   Muscle weakness 06/06/2019   Pancytopenia (HClements 05/28/2019   Dyspnea 05/28/2019   Vasculitis, ANCA positive 05/28/2019   Disorder of connective tissue (HO[place] 05/14/2019   Renal insufficiency 05/08/2019   Acute on chronic diastolic CHF (congestive heart failure) (H[place] 04/07/2019   Left leg cellulitis 04/07/2019   PAF (paroxysmal atrial fibrillation) ([facility] 04/07/2019   Iron deficiency anemia 02/28/2019   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 (HM[place] 02/28/2019   Diabetic retinopathy associated with type 2 diabetes mellitus (H[place] 09/02/2018   Chronic pain 06/19/2018   Non-thrombocytopenic purpura ([facility] 05/21/2018   Gout 03/22/2018   Malnutrition of moderate degree 03/12/2018   Diabetes mellitus type 2, controlled (H[place] 03/06/2018   S/P cholecystectomy 03/06/2018   HTN (hypertension) 03/06/2018   H/O mechanical aortic valve replacement 03/06/2018   Moderate to severe pulmonary hypertension (H[place] 03/06/2018   GERD (gastroesophageal reflux disease) 02/21/2018   Anxiety 02/21/2018   Chronic diastolic CHF (congestive heart failure) (HJackson 02/21/2018   Diarrhea 02/21/2018   General weakness 02/02/2018   Thrombocytopenia (HP[place] 01/25/2018   Arm pain, diffuse 01/25/2018   Anemia 01/23/2018   Aortic atherosclerosis (HY[place] 01/23/2018   GI bleed 01/18/2018   Age-related osteoporosis without current pathological fracture 05/24/2017   Carotid artery occlusion 05/24/2017   Generalized anxiety disorder 05/24/2017   Hearing loss 05/24/2017   Presence of prosthetic heart valve 05/24/2017   Carotid artery disease (HS[place]iyou 08/18/2013   Peripheral arterial disease (  [place]) 08/18/2013   Diabetes mellitus without complication ([place]) 68/34/1962   Hyponatremia 10/06/2011   Hypertension    Hypercholesteremia    Mechanical heart  valve present    Chronic anticoagulation     is allergic to flagyl [metronidazole], ciprofloxacin, coreg [carvedilol], diflucan [fluconazole], losartan, privigen [immune globulin (human)], sulfamethoxazole-trimethoprim, verapamil, zetia [ezetimibe], zocor [simvastatin - high dose], and gatifloxacin.  MEDICAL HISTORY: Past Medical History:  Diagnosis Date   Aortic atherosclerosis ([place]) 01/23/2018   Atrial fibrillation (HCC)    Benign positional vertigo 10/06/2011   Carotid artery disease ([place])    Chemotherapy induced neutropenia ([place]) 07/21/2019   Cholelithiasis 01/23/2018   Cholelithiasis 01/23/2018   Chronic anticoagulation    Coronary artery disease    status post coronary artery bypass grafting times 07/10/2004   Diabetes mellitus    Hypercholesteremia    Hypertension    Mechanical heart valve present    H. aortic valve replacement at the time of bypass surgery October 2005   Moderate to severe pulmonary hypertension ([place])    Peripheral arterial disease ([place])    history of left common iliac artery PTA and stenting for a chronic total occlusion 08/26/01    SURGICAL HISTORY: Past Surgical History:  Procedure Laterality Date   anterior repair  2009   AORTIC VALVE REPLACEMENT  2005   Dr. Cyndia Bent   CARDIAC CATHETERIZATION  11/10/2004   40% right common illiac, 70% in stent restenosis of distal left common illiac,    CARDIAC CATHETERIZATION  05/18/2004   LAD 50-70% midstenosis, RCA dominant w/50% stenosis, 50% Right common Illiac artery ostial stenosis, 90% in stent restenosis within midportion of left common illiac stent   Carotid Duplex  03/12/2012   RSA-elev. velocities suggestive of a 50-69% diameter reduction, Right&Left Bulb/Prox ICA-mild-mod.fibrous plaqueelevating Velocities abnormal study.   CHOLECYSTECTOMY N/A 03/01/2018   Procedure: LAPAROSCOPIC CHOLECYSTECTOMY;  Surgeon: Judeth Horn, MD;  Location: [facility];  Service: General;  Laterality: N/A;   COLONOSCOPY WITH PROPOFOL  N/A 01/22/2018   Procedure: COLONOSCOPY WITH PROPOFOL;  Surgeon: Wilford Corner, MD;  Location: [facility];  Service: Endoscopy;  Laterality: N/A;   ESOPHAGOGASTRODUODENOSCOPY (EGD) WITH PROPOFOL N/A 01/22/2018   Procedure: ESOPHAGOGASTRODUODENOSCOPY (EGD) WITH PROPOFOL;  Surgeon: Wilford Corner, MD;  Location: [facility];  Service: Endoscopy;  Laterality: N/A;   ESOPHAGOGASTRODUODENOSCOPY (EGD) WITH PROPOFOL N/A 11/21/2018   Procedure: ESOPHAGOGASTRODUODENOSCOPY (EGD) WITH PROPOFOL;  Surgeon: [name], MD;  Location: [facility];  Service: Gastroenterology;  Laterality: N/A;   ESOPHAGOGASTRODUODENOSCOPY (EGD) WITH PROPOFOL Left 02/27/2019   Procedure: ESOPHAGOGASTRODUODENOSCOPY (EGD) WITH PROPOFOL;  Surgeon: Arta Silence, MD;  Location: [facility] ENDOSCOPY;  Service: Endoscopy;  Laterality: Left;   GIVENS CAPSULE STUDY N/A 11/21/2018   Procedure: GIVENS CAPSULE STUDY;  Surgeon: [name], MD;  Location: [facility];  Service: Gastroenterology;  Laterality: N/A;  To be deployed during EGD   Lower Ext. Duplex  03/12/2012   Right Proximal CIA- vessel narrowing w/elevated velocities 0-49% diameter reduction. Right SFA-mild mixed density plaque throughout vessel.   NM MYOCAR PERF WALL MOTION  05/19/2010   protocol: Persantine, post stress EF 65%, negative for ischemia, low risk scan   RIGHT HEART CATH N/A 06/27/2018   Procedure: RIGHT HEART CATH;  Surgeon: [name], MD;  Location: [facility];  Service: Cardiovascular;  Laterality: N/A;   TOTAL ABDOMINAL HYSTERECTOMY W/ BILATERAL SALPINGOOPHORECTOMY  1989   TRANSTHORACIC ECHOCARDIOGRAM  08/29/2012   Moderately calcified annulus of mitral valve, moderate regurg. of both mitral valve and tricuspid valve.  SOCIAL HISTORY: Social History   Socioeconomic History   Marital status: Widowed    Spouse name: Not on file   Number of children: Not on file   Years of education: Not on file   Highest education level: Not on file   Occupational History   Occupation: Retired  Tobacco Use   Smoking status: Former    Packs/day: 1.00    Years: 30.00    Pack years: 30.00    Types: Cigarettes   Smokeless tobacco: Never   Tobacco comments:    quit smoking 2005  Vaping Use   Vaping Use: Never used  Substance and Sexual Activity   Alcohol use: No    Alcohol/week: 0.0 standard drinks   Drug use: No   Sexual activity: Not Currently    Birth control/protection: None  Other Topics Concern   Not on file  Social History Narrative   Not on file   Social Determinants of Health   Financial Resource Strain: Low Risk    Difficulty of Paying Living Expenses: Not hard at all  Food Insecurity: No Food Insecurity   Worried About Charity fundraiser in the Last Year: Never true   [place] in the Last Year: Never true  Transportation Needs: No Transportation Needs   Lack of Transportation (Medical): No   Lack of Transportation (Non-Medical): No  Physical Activity: Inactive   Days of Exercise per Week: 0 days   Minutes of Exercise per Session: 0 min  Stress: No Stress Concern Present   Feeling of Stress : Only a little  Social Connections: Not on file  Intimate Partner Violence: Not At Risk   Fear of Current or Ex-Partner: No   Emotionally Abused: No   Physically Abused: No   Sexually Abused: No    FAMILY HISTORY: Family History  Problem Relation Age of Onset   Heart disease Father    Hypertension Father    Hyperlipidemia Father    Breast cancer Neg Hx     Review of Systems  Constitutional:  Positive for fatigue. Negative for appetite change, chills, fever and unexpected weight change.  HENT:   Negative for hearing loss, lump/mass and trouble swallowing.   Eyes:  Negative for eye problems and icterus.  Respiratory:  Negative for chest tightness, cough and shortness of breath.   Cardiovascular:  Positive for leg swelling (Present and slightly improved). Negative for chest pain and palpitations.   Gastrointestinal:  Negative for abdominal distention, abdominal pain, constipation, diarrhea, nausea and vomiting.  Endocrine: Negative for hot flashes.  Genitourinary:  Negative for difficulty urinating.   Musculoskeletal:  Negative for arthralgias.  Skin:  Negative for itching and rash.  Neurological:  Negative for dizziness, extremity weakness, headaches and numbness.  Hematological:  Negative for adenopathy. Does not bruise/bleed easily.  Psychiatric/Behavioral:  Negative for depression. The patient is not nervous/anxious.      PHYSICAL EXAMINATION  ECOG PERFORMANCE STATUS: 2 - Symptomatic, <50% confined to bed  Vitals:   03/03/22 1202  BP: (!) 167/68  Pulse: 77  Resp: 18  Temp: 98.2 F (36.8 C)  SpO2: 98%    Physical Exam Vitals reviewed: Examined in wheelchair.  Constitutional:      General: She is not in acute distress.    Appearance: Normal appearance. She is not toxic-appearing.  HENT:     Head: Normocephalic and atraumatic.  Eyes:     General: No scleral icterus. Cardiovascular:     Rate and Rhythm: Normal rate  and regular rhythm.     Pulses: Normal pulses.     Heart sounds: Normal heart sounds.  Pulmonary:     Effort: Pulmonary effort is normal.     Breath sounds: Normal breath sounds.  Abdominal:     General: Abdomen is flat. Bowel sounds are normal. There is no distension.     Palpations: Abdomen is soft.     Tenderness: There is no abdominal tenderness.  Musculoskeletal:        General: Swelling present.     Cervical back: Neck supple.  Lymphadenopathy:     Cervical: No cervical adenopathy.  Skin:    General: Skin is warm and dry.     Findings: No rash.     Comments: Bilateral lower extremities are wrapped leaving her feet exposed left foot has some resolving ecchymosis that appears slightly improved from prior.  There is no warmth or drainage from this area.  The right foot skin is clear.  Neurological:     General: No focal deficit present.      Mental Status: She is alert.  Psychiatric:        Mood and Affect: Mood normal.        Behavior: Behavior normal.    LABORATORY DATA:  CBC    Component Value Date/Time   WBC 6.2 03/03/2022 1141   WBC 3.7 (L) 02/17/2022 0820   RBC 2.97 (L) 03/03/2022 1141   HGB 9.5 (L) 03/03/2022 1141   HCT 28.1 (L) 03/03/2022 1141   HCT 28.5 (L) 11/20/2018 1824   PLT 145 (L) 03/03/2022 1141   MCV 94.6 03/03/2022 1141   MCH 32.0 03/03/2022 1141   MCHC 33.8 03/03/2022 1141   RDW 16.8 (H) 03/03/2022 1141   LYMPHSABS 0.2 (L) 03/03/2022 1141   MONOABS 0.1 03/03/2022 1141   EOSABS 0.0 03/03/2022 1141   BASOSABS 0.0 03/03/2022 1141    CMP     Component Value Date/Time   NA 131 (L) 03/03/2022 1141   NA 137 12/06/2017 1436   K 4.6 03/03/2022 1141   CL 99 03/03/2022 1141   CO2 22 03/03/2022 1141   GLUCOSE 331 (H) 03/03/2022 1141   BUN 113 (H) 03/03/2022 1141   BUN 20 12/06/2017 1436   CREATININE 2.33 (H) 03/03/2022 1141   CALCIUM 8.8 (L) 03/03/2022 1141   PROT 5.8 (L) 03/03/2022 1141   ALBUMIN 3.5 03/03/2022 1141   AST 55 (H) 03/03/2022 1141   ALT 39 03/03/2022 1141   ALKPHOS 70 03/03/2022 1141   BILITOT 0.6 03/03/2022 1141   GFRNONAA 20 (L) 03/03/2022 1141   GFRAA 32 (L) 03/30/2020 0932      ASSESSMENT and THERAPY PLAN:   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 (HCC) [name] is here today for her refractory anemia.  Her hemoglobin remained stable at 9.5 today.  She will continue prednisone at 15 mg daily and also the CellCept daily.    Her blood sugars are improved from prior and she will continue the sliding scale insulin as indicated and prescribed.  She wanted her INR drawn and so I added this on for her today we will forward the results to her cardiology clinic that is managing it.  We will see her back on Tuesday, May 30 for labs and follow-up.   All questions were answered. The patient knows to call the clinic with any problems, questions or concerns.  We can certainly see the patient much sooner if necessary.  Total encounter time:30 minutes*in face-to-face visit  time, chart review, lab review, care coordination, order entry, and documentation of the encounter time.  Wilber Bihari, NP 03/03/22 2:18 PM Medical Oncology and Hematology [facility] [place], [place] [address] Tel. [phone]    Fax. [phone]  *Total Encounter Time as defined by the Centers for Medicare and Medicaid Services includes, in addition to the face-to-face time of a patient visit (documented in the note above) non-face-to-face time: obtaining and reviewing outside history, ordering and reviewing medications, tests or procedures, care coordination (communications with other health care professionals or caregivers) and documentation in the medical record.

## 2022-03-03 NOTE — Patient Instructions (Signed)
Description   Spoke with pt and instructed to take 1.5 tablets then start taking 1 tablet daily except 1.5 tablets on Sundays and Wednesdays. Recheck in 2 weeks with [facility]. Call with Prednisone dosage changes, 02/22/22-reports on '25mg'$  daily. Please keep green leafy veggies and boost in your diet.   Call with any new meds [phone] & if you have any procedures where you need to hold warfarin, have them fax a clearance form or labs to [phone].

## 2022-03-07 ENCOUNTER — Other Ambulatory Visit: Payer: Self-pay

## 2022-03-07 ENCOUNTER — Ambulatory Visit (INDEPENDENT_AMBULATORY_CARE_PROVIDER_SITE_OTHER): Payer: Medicare HMO

## 2022-03-07 ENCOUNTER — Inpatient Hospital Stay (HOSPITAL_BASED_OUTPATIENT_CLINIC_OR_DEPARTMENT_OTHER): Payer: Medicare HMO | Admitting: Adult Health

## 2022-03-07 ENCOUNTER — Ambulatory Visit (INDEPENDENT_AMBULATORY_CARE_PROVIDER_SITE_OTHER): Payer: Medicare HMO | Admitting: Vascular Surgery

## 2022-03-07 ENCOUNTER — Inpatient Hospital Stay: Payer: Medicare HMO

## 2022-03-07 ENCOUNTER — Encounter: Payer: Self-pay | Admitting: Vascular Surgery

## 2022-03-07 ENCOUNTER — Ambulatory Visit (HOSPITAL_COMMUNITY)
Admission: RE | Admit: 2022-03-07 | Discharge: 2022-03-07 | Disposition: A | Discharge status: Home / Self Care | Payer: Medicare HMO | Source: Ambulatory Visit | Attending: Vascular Surgery | Admitting: Vascular Surgery

## 2022-03-07 VITALS — BP 140/76 | HR 65 | Temp 97.7°F | Resp 18 | Ht 59.0 in | Wt 135.0 lb

## 2022-03-07 VITALS — BP 152/54 | HR 69 | Temp 97.7°F | Resp 14 | Ht 59.0 in | Wt 135.0 lb

## 2022-03-07 DIAGNOSIS — Z952 Presence of prosthetic heart valve: Secondary | ICD-10-CM | POA: Diagnosis not present

## 2022-03-07 DIAGNOSIS — D591 Autoimmune hemolytic anemia, unspecified: Secondary | ICD-10-CM

## 2022-03-07 DIAGNOSIS — R102 Pelvic and perineal pain: Secondary | ICD-10-CM | POA: Diagnosis not present

## 2022-03-07 DIAGNOSIS — Z7901 Long term (current) use of anticoagulants: Secondary | ICD-10-CM | POA: Diagnosis not present

## 2022-03-07 DIAGNOSIS — D594 Other nonautoimmune hemolytic anemias: Secondary | ICD-10-CM

## 2022-03-07 DIAGNOSIS — I7782 Antineutrophilic cytoplasmic antibody (ANCA) vasculitis: Secondary | ICD-10-CM | POA: Diagnosis not present

## 2022-03-07 DIAGNOSIS — I6523 Occlusion and stenosis of bilateral carotid arteries: Secondary | ICD-10-CM | POA: Insufficient documentation

## 2022-03-07 DIAGNOSIS — N1832 Chronic kidney disease, stage 3b: Secondary | ICD-10-CM | POA: Diagnosis not present

## 2022-03-07 DIAGNOSIS — R197 Diarrhea, unspecified: Secondary | ICD-10-CM | POA: Diagnosis not present

## 2022-03-07 DIAGNOSIS — M8580 Other specified disorders of bone density and structure, unspecified site: Secondary | ICD-10-CM | POA: Diagnosis not present

## 2022-03-07 DIAGNOSIS — R739 Hyperglycemia, unspecified: Secondary | ICD-10-CM | POA: Diagnosis not present

## 2022-03-07 DIAGNOSIS — N939 Abnormal uterine and vaginal bleeding, unspecified: Secondary | ICD-10-CM | POA: Diagnosis not present

## 2022-03-07 LAB — CBC WITH DIFFERENTIAL (CANCER CENTER ONLY)
Abs Immature Granulocytes: 0.07 10*3/uL (ref 0.00–0.07)
Basophils Absolute: 0 10*3/uL (ref 0.0–0.1)
Basophils Relative: 0 %
Eosinophils Absolute: 0 10*3/uL (ref 0.0–0.5)
Eosinophils Relative: 0 %
HCT: 27.8 % — ABNORMAL LOW (ref 36.0–46.0)
Hemoglobin: 9.1 g/dL — ABNORMAL LOW (ref 12.0–15.0)
Immature Granulocytes: 1 %
Lymphocytes Relative: 6 %
Lymphs Abs: 0.3 10*3/uL — ABNORMAL LOW (ref 0.7–4.0)
MCH: 31.7 pg (ref 26.0–34.0)
MCHC: 32.7 g/dL (ref 30.0–36.0)
MCV: 96.9 fL (ref 80.0–100.0)
Monocytes Absolute: 0.2 10*3/uL (ref 0.1–1.0)
Monocytes Relative: 3 %
Neutro Abs: 4.4 10*3/uL (ref 1.7–7.7)
Neutrophils Relative %: 90 %
Platelet Count: 117 10*3/uL — ABNORMAL LOW (ref 150–400)
RBC: 2.87 MIL/uL — ABNORMAL LOW (ref 3.87–5.11)
RDW: 16.7 % — ABNORMAL HIGH (ref 11.5–15.5)
WBC Count: 4.9 10*3/uL (ref 4.0–10.5)
nRBC: 0 % (ref 0.0–0.2)

## 2022-03-07 LAB — CMP (CANCER CENTER ONLY)
ALT: 29 U/L (ref 0–44)
AST: 34 U/L (ref 15–41)
Albumin: 3.5 g/dL (ref 3.5–5.0)
Alkaline Phosphatase: 67 U/L (ref 38–126)
Anion gap: 6 (ref 5–15)
BUN: 104 mg/dL — ABNORMAL HIGH (ref 8–23)
CO2: 24 mmol/L (ref 22–32)
Calcium: 9 mg/dL (ref 8.9–10.3)
Chloride: 99 mmol/L (ref 98–111)
Creatinine: 2.49 mg/dL — ABNORMAL HIGH (ref 0.44–1.00)
GFR, Estimated: 19 mL/min — ABNORMAL LOW (ref >60)
Glucose, Bld: 224 mg/dL — ABNORMAL HIGH (ref 70–99)
Potassium: 5.3 mmol/L — ABNORMAL HIGH (ref 3.5–5.1)
Sodium: 129 mmol/L — ABNORMAL LOW (ref 135–145)
Total Bilirubin: 0.6 mg/dL (ref 0.3–1.2)
Total Protein: 5.8 g/dL — ABNORMAL LOW (ref 6.5–8.1)

## 2022-03-07 LAB — RETIC PANEL
Immature Retic Fract: 5.2 % (ref 2.3–15.9)
RBC.: 2.8 MIL/uL — ABNORMAL LOW (ref 3.87–5.11)
Retic Count, Absolute: 128.5 10*3/uL (ref 19.0–186.0)
Retic Ct Pct: 4.6 % — ABNORMAL HIGH (ref 0.4–3.1)
Reticulocyte Hemoglobin: 35.2 pg (ref >27.9)

## 2022-03-07 LAB — PROTIME-INR
INR: 2 — ABNORMAL HIGH (ref 0.8–1.2)
Prothrombin Time: 22.4 seconds — ABNORMAL HIGH (ref 11.4–15.2)

## 2022-03-07 MED ORDER — PREDNISONE 10 MG PO TABS: 10.0000 mg | ORAL_TABLET | Freq: Every day | ORAL | 0 refills | Status: AC

## 2022-03-07 NOTE — Progress Notes (Signed)
Patient name: [name] MRN: [id number] DOB: [birth year] Sex: female  REASON FOR CONSULT: 6 month follow-up for high-grade asymptomatic left carotid stenosis  HPI: [name] is a 81 y.o. female, with history of hypertension, hyperlipidemia, diastolic heart failure, moderate to severe pulmonary hypertension, A. Fib on coumadin, chronic kidney disease, CAD s/p CABG with mechanical AVR, peripheral arterial disease that presents for 6 month follow-up of her carotid disease.  She has been followed by Dr. [name] who has performed lower extremity interventions for her PAD.  She had previously been noted to have progression of her left ICA stenosis to 80-99% by duplex on 05/20/21.  Given her CKD we sent her for CT of the neck w/o contrast to evaluate for possible carotid stenting with TCAR. She was not a candidate for TCAR given severely diseased common carotid artery.  Ultimately we decided on medical management.  Today she reports no neurologic symptoms over the last 6 months.  She is on high-dose steroids with significant lower extremity edema that is her biggest complaint.  Recently discharged from the hospital on 02/10/2022 with symptomatic anemia in the setting of autoimmune hemolytic anemia.  Past Medical History:  Diagnosis Date   Aortic atherosclerosis ([place]) 01/23/2018   Atrial fibrillation ([facility])    Benign positional vertigo 10/06/2011   Carotid artery disease ([place])    Chemotherapy induced neutropenia ([place]) 07/21/2019   Cholelithiasis 01/23/2018   Cholelithiasis 01/23/2018   Chronic anticoagulation    Coronary artery disease    status post coronary artery bypass grafting times 07/10/2004   Diabetes mellitus    Hypercholesteremia    Hypertension    Mechanical heart valve present    H. aortic valve replacement at the time of bypass surgery October 2005   Moderate to severe pulmonary hypertension ([place])    Peripheral arterial disease ([place])    history of left common iliac artery PTA and  stenting for a chronic total occlusion 08/26/01    Past Surgical History:  Procedure Laterality Date   anterior repair  2009   AORTIC VALVE REPLACEMENT  2005   Dr. Cyndia Bent   CARDIAC CATHETERIZATION  11/10/2004   40% right common illiac, 70% in stent restenosis of distal left common illiac,    CARDIAC CATHETERIZATION  05/18/2004   LAD 50-70% midstenosis, RCA dominant w/50% stenosis, 50% Right common Illiac artery ostial stenosis, 90% in stent restenosis within midportion of left common illiac stent   Carotid Duplex  03/12/2012   RSA-elev. velocities suggestive of a 50-69% diameter reduction, Right&Left Bulb/Prox ICA-mild-mod.fibrous plaqueelevating Velocities abnormal study.   CHOLECYSTECTOMY N/A 03/01/2018   Procedure: LAPAROSCOPIC CHOLECYSTECTOMY;  Surgeon: Judeth Horn, MD;  Location: [place];  Service: General;  Laterality: N/A;   COLONOSCOPY WITH PROPOFOL N/A 01/22/2018   Procedure: COLONOSCOPY WITH PROPOFOL;  Surgeon: Wilford Corner, MD;  Location: [place];  Service: Endoscopy;  Laterality: N/A;   ESOPHAGOGASTRODUODENOSCOPY (EGD) WITH PROPOFOL N/A 01/22/2018   Procedure: ESOPHAGOGASTRODUODENOSCOPY (EGD) WITH PROPOFOL;  Surgeon: Wilford Corner, MD;  Location: [facility];  Service: Endoscopy;  Laterality: N/A;   ESOPHAGOGASTRODUODENOSCOPY (EGD) WITH PROPOFOL N/A 11/21/2018   Procedure: ESOPHAGOGASTRODUODENOSCOPY (EGD) WITH PROPOFOL;  Surgeon: [name], MD;  Location: [facility];  Service: Gastroenterology;  Laterality: N/A;   ESOPHAGOGASTRODUODENOSCOPY (EGD) WITH PROPOFOL Left 02/27/2019   Procedure: ESOPHAGOGASTRODUODENOSCOPY (EGD) WITH PROPOFOL;  Surgeon: Arta Silence, MD;  Location[facility] ENDOSCOPY;  Service: Endoscopy;  Laterality: Left;   GIVENS CAPSULE STUDY N/A 11/21/2018   Procedure: GIVENS CAPSULE STUDY;  Surgeon: Therisa Doyne,  Megan Salon, MD;  Location: [facility];  Service: Gastroenterology;  Laterality: N/A;  To be deployed during EGD   Lower Ext. Duplex  03/12/2012   Right  Proximal CIA- vessel narrowing w/elevated velocities 0-49% diameter reduction. Right SFA-mild mixed density plaque throughout vessel.   NM MYOCAR PERF WALL MOTION  05/19/2010   protocol: Persantine, post stress EF 65%, negative for ischemia, low risk scan   RIGHT HEART CATH N/A 06/27/2018   Procedure: RIGHT HEART CATH;  Surgeon: [name], MD;  Location: [facility];  Service: Cardiovascular;  Laterality: N/A;   TOTAL ABDOMINAL HYSTERECTOMY W/ BILATERAL SALPINGOOPHORECTOMY  1989   TRANSTHORACIC ECHOCARDIOGRAM  08/29/2012   Moderately calcified annulus of mitral valve, moderate regurg. of both mitral valve and tricuspid valve.     Family History  Problem Relation Age of Onset   Heart disease Father    Hypertension Father    Hyperlipidemia Father    Breast cancer Neg Hx     SOCIAL HISTORY: Social History   Socioeconomic History   Marital status: Widowed    Spouse name: Not on file   Number of children: Not on file   Years of education: Not on file   Highest education level: Not on file  Occupational History   Occupation: Retired  Tobacco Use   Smoking status: Former    Packs/day: 1.00    Years: 30.00    Pack years: 30.00    Types: Cigarettes   Smokeless tobacco: Never   Tobacco comments:    quit smoking 2005  Vaping Use   Vaping Use: Never used  Substance and Sexual Activity   Alcohol use: No    Alcohol/week: 0.0 standard drinks   Drug use: No   Sexual activity: Not Currently    Birth control/protection: None  Other Topics Concern   Not on file  Social History Narrative   Not on file   Social Determinants of Health   Financial Resource Strain: Low Risk    Difficulty of Paying Living Expenses: Not hard at all  Food Insecurity: No Food Insecurity   Worried About Charity fundraiser in the Last Year: Never true   [place] in the Last Year: Never true  Transportation Needs: No Transportation Needs   Lack of Transportation (Medical): No   Lack  of Transportation (Non-Medical): No  Physical Activity: Inactive   Days of Exercise per Week: 0 days   Minutes of Exercise per Session: 0 min  Stress: No Stress Concern Present   Feeling of Stress : Only a little  Social Connections: Not on file  Intimate Partner Violence: Not At Risk   Fear of Current or Ex-Partner: No   Emotionally Abused: No   Physically Abused: No   Sexually Abused: No    Allergies  Allergen Reactions   Flagyl [Metronidazole] Rash    ALL-OVER BODY RASH   Ciprofloxacin     Other reaction(s): Stomach upset - tolerates Other reaction(s): Stomach upset - tolerates   Coreg [Carvedilol] Other (See Comments)    Terrible cramping in the feet and had a lot of bowel movements, but not diarrhea   Diflucan [Fluconazole] Diarrhea   Losartan Swelling    Patient doesn't recall site of swelling   Privigen [Immune Globulin (Human)] Other (See Comments)    Severe/excruciating pain   Sulfamethoxazole-Trimethoprim     Other reaction(s): stomach upset .   Verapamil Hives   Zetia [Ezetimibe] Other (See Comments)    Reaction not  recalled   Zocor [Simvastatin - High Dose] Other (See Comments)    Reaction not recalled   Gatifloxacin Rash    Redness to skin around eye    Current Outpatient Medications  Medication Sig Dispense Refill   acetaminophen (TYLENOL) 325 MG tablet Take 650 mg by mouth every 6 (six) hours as needed for mild pain.      alendronate (FOSAMAX) 70 MG tablet Take 70 mg by mouth every Saturday.     allopurinol (ZYLOPRIM) 100 MG tablet Take 100 mg by mouth daily.     ALPRAZolam (XANAX) 0.5 MG tablet Take 0.25-0.5 mg by mouth at bedtime.     amLODipine (NORVASC) 10 MG tablet TAKE 1 TABLET EVERY DAY (Patient taking differently: Take 10 mg by mouth daily.) 90 tablet 3   atorvastatin (LIPITOR) 80 MG tablet Take 1 tablet (80 mg total) by mouth daily at 6 PM.     Blood Glucose Monitoring Suppl (TRUE METRIX METER) w/Device KIT      Cholecalciferol (VITAMIN D3) 50  MCG (2000 UT) capsule Take 2,000 Units by mouth daily with lunch.     cloNIDine (CATAPRES) 0.2 MG tablet TAKE 1 TABLET TWICE DAILY (Patient taking differently: Take 0.2 mg by mouth 2 (two) times daily.) 180 tablet 3   Cyanocobalamin (VITAMIN B12) 1000 MCG TBCR Take 1,000 mcg by mouth daily with lunch.     estradiol (ESTRACE) 0.1 MG/GM vaginal cream Place 0.5g nightly for two weeks then twice a week after (Patient taking differently: Place 1 Applicatorful vaginally 3 (three) times a week. Place 0.5g nightly for two weeks then twice a week after) 30 g 11   folic acid (FOLVITE) 1 MG tablet Take 1 tablet (1 mg total) by mouth daily. 30 tablet 2   furosemide (LASIX) 40 MG tablet TAKE 2 TABLETS (80 MG TOTAL) IN MORNING AND 1 AND 1/2 TABLETS (60 MG TOTAL) IN EVENING. CHANGE IN DOSAGE. 315 tablet 0   glipiZIDE (GLUCOTROL) 5 MG tablet Take 1 tablet (5 mg total) by mouth daily. 30 tablet 0   glucose blood (TRUE METRIX BLOOD GLUCOSE TEST) test strip Use 1-2 times daily     hydrALAZINE (APRESOLINE) 100 MG tablet TAKE 1 TABLET THREE TIMES DAILY 270 tablet 3   insulin aspart (NOVOLOG) 100 UNIT/ML injection Take 2 units for blood sugar 200-250, 4 units for blood sugar 252-300, 6 units for blood sugar 301-350, 8 units for blood sugar greater than 350 and call MD. 10 mL 11   Insulin Syringe-Needle U-100 (B-D INS SYR ULTRAFINE .3CC/29G) 29G X 1/2" 0.3 ML MISC Use for sliding scale for breakthrough high blood sugar readings 100 each 1   lactose free nutrition (BOOST) LIQD Take 237 mLs by mouth daily. (To supplement meals)      lidocaine (XYLOCAINE) 2 % solution Use as directed 15 mLs in the mouth or throat as needed for mouth pain. 100 mL 2   magnesium oxide (MAG-OX) 400 (241.3 Mg) MG tablet Take 1 tablet (400 mg total) by mouth daily.     metoprolol succinate (TOPROL-XL) 25 MG 24 hr tablet TAKE 1/2 TABLET EVERY DAY (Patient taking differently: Take 12.5 mg by mouth daily.) 45 tablet 3   mycophenolate (CELLCEPT) 500 MG  tablet Take 1 tablet by mouth once daily. 30 tablet 1   ondansetron (ZOFRAN-ODT) 4 MG disintegrating tablet Take 4 mg by mouth every 8 (eight) hours as needed for vomiting or nausea.     pantoprazole (PROTONIX) 40 MG tablet Take 40 mg by  mouth 2 (two) times daily.      predniSONE (DELTASONE) 10 MG tablet Take 3 tablets (30 mg total) by mouth daily with breakfast. 90 tablet 0   predniSONE (DELTASONE) 5 MG tablet Take 1 tablet (5 mg total) by mouth daily with breakfast. 30 tablet 0   spironolactone (ALDACTONE) 25 MG tablet TAKE 1 TABLET EVERY DAY (Patient taking differently: Take 25 mg by mouth daily.) 90 tablet 3   tadalafil, PAH, (ADCIRCA) 20 MG tablet Take 2 tablets (40 mg total) by mouth daily. (Patient taking differently: Take 40 mg by mouth at bedtime.) 60 tablet 11   TRESIBA FLEXTOUCH 100 UNIT/ML FlexTouch Pen Inject 14 Units into the skin at bedtime.     TRUE METRIX BLOOD GLUCOSE TEST test strip SMARTSIG:Via Meter     vitamin C (ASCORBIC ACID) 500 MG tablet Take 500 mg by mouth daily with lunch.     vitamin C (ASCORBIC ACID) 500 MG tablet      warfarin (COUMADIN) 5 MG tablet Take 1 to 1 &1/2 tablets by mouth daily as directed by the coumadin clinic (Patient taking differently: Take 5-7.5 mg by mouth See admin instructions. 5 mg on Monday,Wednesday,Thursday,Friday,Saturday 7.5 mg on Tuesday and sunday) 120 tablet 0   valACYclovir (VALTREX) 500 MG tablet Take 1 tablet (500 mg total) by mouth 2 (two) times daily. (Patient not taking: Reported on 03/07/2022) 14 tablet 0   No current facility-administered medications for this visit.    REVIEW OF SYSTEMS:  '[X]'  denotes positive finding, '[ ]'  denotes negative finding Cardiac  Comments:  Chest pain or chest pressure:    Shortness of breath upon exertion:    Short of breath when lying flat:    Irregular heart rhythm:        Vascular    Pain in calf, thigh, or hip brought on by ambulation:    Pain in feet at night that wakes you up from your  sleep:     Blood clot in your veins:    Leg swelling:         Pulmonary    Oxygen at home:    Productive cough:     Wheezing:         Neurologic    Sudden weakness in arms or legs:     Sudden numbness in arms or legs:     Sudden onset of difficulty speaking or slurred speech:    Temporary loss of vision in one eye:     Problems with dizziness:         Gastrointestinal    Blood in stool:     Vomited blood:         Genitourinary    Burning when urinating:     Blood in urine:        Psychiatric    Major depression:         Hematologic    Bleeding problems:    Problems with blood clotting too easily:        Skin    Rashes or ulcers:        Constitutional    Fever or chills:      PHYSICAL EXAM: Vitals:   03/07/22 1148 03/07/22 1150  BP: (!) 165/53 (!) 152/54  Pulse: 65 69  Resp: 14   Temp: 97.7 F (36.5 C)   TempSrc: Temporal   SpO2: 97%   Weight: 135 lb (61.2 kg)   Height: '4\' 11"'  (1.499 m)     GENERAL: The patient  is a well-nourished female, in no acute distress. The vital signs are documented above. CARDIAC: Irregular rhythm.  PULMONARY: No respiratory distress ABDOMEN: Soft and non-tender. MUSCULOSKELETAL: There are no major deformities or cyanosis. NEUROLOGIC: No focal weakness or paresthesias are detected.  Cranial nerves II through XII grossly intact SKIN: There are no ulcers or rashes noted. PSYCHIATRIC: The patient has a normal affect.  DATA:   Carotid duplex today shows a 40 to 59% right ICA stenosis and a 60-79% left ICA stenosis.  Today's duplex did not demonstrate previous high-grade left ICA stenosis by velocity criteria.  Previous CT neck without contrast showed significant calcification throughout the left common carotid artery into the bifurcation with significant circumferential calcification in the proximal ICA.  Assessment/Plan:  81 year old female with multiple medical comorbidities presents for interval 6 month follow-up of  asymptomatic high-grade left ICA stenosis 80 to 99% based on carotid duplex criteria.  I initially thought her best option was likely TCAR last year given her comorbidities and the need to get her back on anticoagulation immediately for her mechanical aortic valve.  Unfortunately after review of her CT neck last year she had significant calcification in the common carotid and at the bifurcation and she was not a good candidate for TCAR.  Previously discussed that in asymptomatic carotid trials stroke risk reduction was from 11% with medical management to around 5% over 5 years with modest risk reduction.  Discussed that in the setting of asymptomatic disease with her comorbid risk it would be very reasonable to continue medical management.  I think her perioperative complication risk remains very high with her comorbidities.  Just recently released from the hospital with worsening anemia in the setting of autoimmune hemolytic anemia in addition to her other health issues.  I will see her again in 6 months.  Discussed that she stay on an 81 mg aspirin daily for risk reduction of able.  She is otherwise on coumadin for her aortic valve and lipitor.   Marty Heck, MD [facility] Office: [phone]

## 2022-03-07 NOTE — Patient Instructions (Addendum)
Description   Spoke with pt's daughter, Helene Kelp and instructed to continue taking 1 tablet daily except 1.5 tablets on Sundays and Wednesdays.  Recheck in 1 week with [place].  Call with Prednisone dosage changes ('10mg'$  daily)  Please keep green leafy veggies and boost in your diet.   Call with any new meds [phone] & if you have any procedures where you need to hold warfarin, have them fax a clearance form or labs to [phone].

## 2022-03-08 ENCOUNTER — Encounter: Payer: Self-pay | Admitting: Podiatry

## 2022-03-08 ENCOUNTER — Encounter: Payer: Self-pay | Admitting: Adult Health

## 2022-03-08 ENCOUNTER — Encounter: Payer: Self-pay | Admitting: Hematology and Oncology

## 2022-03-08 ENCOUNTER — Ambulatory Visit (INDEPENDENT_AMBULATORY_CARE_PROVIDER_SITE_OTHER): Payer: Medicare HMO | Admitting: Podiatry

## 2022-03-08 DIAGNOSIS — B351 Tinea unguium: Secondary | ICD-10-CM

## 2022-03-08 DIAGNOSIS — E1151 Type 2 diabetes mellitus with diabetic peripheral angiopathy without gangrene: Secondary | ICD-10-CM | POA: Diagnosis not present

## 2022-03-08 DIAGNOSIS — M79674 Pain in right toe(s): Secondary | ICD-10-CM | POA: Diagnosis not present

## 2022-03-08 DIAGNOSIS — I5032 Chronic diastolic (congestive) heart failure: Secondary | ICD-10-CM | POA: Diagnosis not present

## 2022-03-08 DIAGNOSIS — M79675 Pain in left toe(s): Secondary | ICD-10-CM

## 2022-03-08 DIAGNOSIS — N184 Chronic kidney disease, stage 4 (severe): Secondary | ICD-10-CM | POA: Diagnosis not present

## 2022-03-08 DIAGNOSIS — I130 Hypertensive heart and chronic kidney disease with heart failure and stage 1 through stage 4 chronic kidney disease, or unspecified chronic kidney disease: Secondary | ICD-10-CM | POA: Diagnosis not present

## 2022-03-08 NOTE — Progress Notes (Signed)
[facility] Follow up:    [name], [name], MD [place] [address]   DIAGNOSIS: Hemolytic Anemia  SUMMARY OF HEMATOLOGIC HISTORY: 81 y.o. [name] woman with multifactorial anemia, as follows:             (a) anemia of chronic inflammation: As of May 2020 she has a SED rate of 95, CRP 1.1, positive ANA and RF; subsequently she was found to be ANCA positive             (b) hemolysis (mild): she has a mildly elevated LDH and a DAT positive for complement, not IgG; this is c/w (a) above             (c) anemia of renal insufficiency: inadequate EPO resonse to anemia                         (d) GIB/ iron deficiency in patient on lifelong anticoagulaton   (1) Feraheme received 03/01/2019, repeated 03/20/2019              (a) reticulocyte 03/07/2019 up from 43.4 a year ago to 191.8 after iron infusion             (b) feraheme repeated on 05/12/2019             (c) subsequent ferritin levels >100   (2) darbepoetin: starting 05/05/2019 at 151mg given every 2 weeks             (a) dose increased to 200 mcg every week starting 07/01/2019              (b) back to every 2 weeks beginning 08/04/2019 still at 200 mcg dose             (c) switched to Retacrit every 4 weeks as of 08/18/2019   (3) ANCA positive vasculitis: diagnosed 05/08/2019 (titer 1:640)             (a) prednisone 40 mg daily started 05/31/2019             (b) rituximab weekly started 06/12/2019 (received test dose 06/05/2019)                         (i) hepatitis B sAg, core Ab and HIV negative 05/08/2019                         (ii) rituximab held after 07/14/2019 dose (received 5 weekly doses) with neutropenia                         (iii) rituximab resumed 08/18/2019 but changed to monthly                         (iv) rituximab changed to every 2 months beginning with 02/02/2020 dose                         (v) rituximab changed to every 12 weeks after the 10/12/2020 dose                          (Vi) rituximab restarted at weekly beginning 12/20/2021-01/30/2022 with no improvement in hemoglobin                         (  Vii) IVIG given on 02/06/2022 with poor tolerance              (c) prednisone tapered off 09/15/2019; restarted 12/13/2020             (D) bone marrow biopsy on 02/20/2022                (4) osteopenia with a T score of -1.5 on bone density 02/28/2016 at the breast center  CURRENT THERAPY: prednisone, cellcelpt  INTERVAL HISTORY: [name] 81 y.o. female returns for follow-up of her hemolytic anemia.  She is feeling better on lower dose of Prednisone.  She saw her vascular team today and they wrapped her legs.  He attributed the swelling to the prednisone that [name] is taking.    She denies any significant issues.  She is no longer experiencing hematuria or dysuria.  She has noted a mild constipation and is eating increased berries with this.  Otherwise she is doing moderately well.    Patient Active Problem List   Diagnosis Date Noted   Hyperkalemia 02/04/2022   Acute renal failure superimposed on stage 3b chronic kidney disease ([place]) 02/04/2022   Hyperglycemia 01/09/2022   Autoimmune hemolytic anemia (HCC) 01/02/2022   Bilateral lower extremity edema 01/02/2022   Refractory anemia (HCC) 12/15/2021   Abnormal chest x-ray 12/15/2021   Supratherapeutic INR 12/15/2021   Elevated troponin 12/15/2021   Unilateral primary osteoarthritis, right hip 05/31/2021   Midline cystocele 05/30/2021   Primary localized osteoarthritis of pelvic region and thigh 05/30/2021   Vaginal pain 05/30/2021   Asymmetric SNHL (sensorineural hearing loss) 01/06/2021   Referred otalgia of both ears 01/06/2021   Bilateral hearing loss 08/31/2020   Low back pain 02/20/2020   CKD (chronic kidney disease), stage IV ([place]) 10/13/2019   Leukopenia due to antineoplastic chemotherapy ([place]) 08/18/2019   Thrombophilia ([place]) 06/19/2019   Encounter for general adult medical examination  without abnormal findings 06/13/2019   Arteritis ([place]) 06/06/2019   Hypertensive heart and chronic kidney disease with heart failure and stage 1 through stage 4 chronic kidney disease, or unspecified chronic kidney disease ([place]) 06/06/2019   Muscle weakness 06/06/2019   Pancytopenia ([place]) 05/28/2019   Dyspnea 05/28/2019   Vasculitis, ANCA positive 05/28/2019   Disorder of connective tissue ([place]) 05/14/2019   Renal insufficiency 05/08/2019   Acute on chronic diastolic CHF (congestive heart failure) ([place]) 04/07/2019   Left leg cellulitis 04/07/2019   PAF (paroxysmal atrial fibrillation) ([place]) 04/07/2019   Iron deficiency anemia 02/28/2019   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 ([place]) 02/28/2019   Diabetic retinopathy associated with type 2 diabetes mellitus ([place]) 09/02/2018   Chronic pain 06/19/2018   Non-thrombocytopenic purpura ([place]) 05/21/2018   Gout 03/22/2018   Malnutrition of moderate degree 03/12/2018   Diabetes mellitus type 2, controlled ([place]) 03/06/2018   S/P cholecystectomy 03/06/2018   HTN (hypertension) 03/06/2018   H/O mechanical aortic valve replacement 03/06/2018   Moderate to severe pulmonary hypertension ([place]) 03/06/2018   GERD (gastroesophageal reflux disease) 02/21/2018   Anxiety 02/21/2018   Chronic diastolic CHF (congestive heart failure) ([place]) 02/21/2018   Diarrhea 02/21/2018   General weakness 02/02/2018   Thrombocytopenia ([place]) 01/25/2018   Arm pain, diffuse 01/25/2018   Anemia 01/23/2018   Aortic atherosclerosis ([place]) 01/23/2018   GI bleed 01/18/2018   Age-related osteoporosis without current pathological fracture 05/24/2017   Carotid artery occlusion 05/24/2017   Generalized anxiety disorder 05/24/2017   Hearing loss 05/24/2017   Presence of prosthetic heart  valve 05/24/2017   Carotid artery disease ([place]) 08/18/2013   Peripheral arterial disease ([place]) 08/18/2013   Diabetes mellitus without complication ([place]) 57/26/2035    Hyponatremia 10/06/2011   Hypertension    Hypercholesteremia    Mechanical heart valve present    Chronic anticoagulation     is allergic to flagyl [metronidazole], ciprofloxacin, coreg [carvedilol], diflucan [fluconazole], losartan, privigen [immune globulin (human)], sulfamethoxazole-trimethoprim, verapamil, zetia [ezetimibe], zocor [simvastatin - high dose], and gatifloxacin.  MEDICAL HISTORY: Past Medical History:  Diagnosis Date   Aortic atherosclerosis ([place]) 01/23/2018   Atrial fibrillation (HCC)    Benign positional vertigo 10/06/2011   Carotid artery disease ([place])    Chemotherapy induced neutropenia ([place]) 07/21/2019   Cholelithiasis 01/23/2018   Cholelithiasis 01/23/2018   Chronic anticoagulation    Coronary artery disease    status post coronary artery bypass grafting times 07/10/2004   Diabetes mellitus    Hypercholesteremia    Hypertension    Mechanical heart valve present    H. aortic valve replacement at the time of bypass surgery October 2005   Moderate to severe pulmonary hypertension ([place])    Peripheral arterial disease ([place])    history of left common iliac artery PTA and stenting for a chronic total occlusion 08/26/01    SURGICAL HISTORY: Past Surgical History:  Procedure Laterality Date   anterior repair  2009   AORTIC VALVE REPLACEMENT  2005   Dr. Cyndia Bent   CARDIAC CATHETERIZATION  11/10/2004   40% right common illiac, 70% in stent restenosis of distal left common illiac,    CARDIAC CATHETERIZATION  05/18/2004   LAD 50-70% midstenosis, RCA dominant w/50% stenosis, 50% Right common Illiac artery ostial stenosis, 90% in stent restenosis within midportion of left common illiac stent   Carotid Duplex  03/12/2012   RSA-elev. velocities suggestive of a 50-69% diameter reduction, Right&Left Bulb/Prox ICA-mild-mod.fibrous plaqueelevating Velocities abnormal study.   CHOLECYSTECTOMY N/A 03/01/2018   Procedure: LAPAROSCOPIC CHOLECYSTECTOMY;  Surgeon: Judeth Horn, MD;   Location: [facility];  Service: General;  Laterality: N/A;   COLONOSCOPY WITH PROPOFOL N/A 01/22/2018   Procedure: COLONOSCOPY WITH PROPOFOL;  Surgeon: Wilford Corner, MD;  Location: [facility];  Service: Endoscopy;  Laterality: N/A;   ESOPHAGOGASTRODUODENOSCOPY (EGD) WITH PROPOFOL N/A 01/22/2018   Procedure: ESOPHAGOGASTRODUODENOSCOPY (EGD) WITH PROPOFOL;  Surgeon: Wilford Corner, MD;  Location: [facility];  Service: Endoscopy;  Laterality: N/A;   ESOPHAGOGASTRODUODENOSCOPY (EGD) WITH PROPOFOL N/A 11/21/2018   Procedure: ESOPHAGOGASTRODUODENOSCOPY (EGD) WITH PROPOFOL;  Surgeon: [name], MD;  Location: [facility];  Service: Gastroenterology;  Laterality: N/A;   ESOPHAGOGASTRODUODENOSCOPY (EGD) WITH PROPOFOL Left 02/27/2019   Procedure: ESOPHAGOGASTRODUODENOSCOPY (EGD) WITH PROPOFOL;  Surgeon: Arta Silence, MD;  Location: [facility] ENDOSCOPY;  Service: Endoscopy;  Laterality: Left;   GIVENS CAPSULE STUDY N/A 11/21/2018   Procedure: GIVENS CAPSULE STUDY;  Surgeon: [name], MD;  Location: [facility];  Service: Gastroenterology;  Laterality: N/A;  To be deployed during EGD   Lower Ext. Duplex  03/12/2012   Right Proximal CIA- vessel narrowing w/elevated velocities 0-49% diameter reduction. Right SFA-mild mixed density plaque throughout vessel.   NM MYOCAR PERF WALL MOTION  05/19/2010   protocol: Persantine, post stress EF 65%, negative for ischemia, low risk scan   RIGHT HEART CATH N/A 06/27/2018   Procedure: RIGHT HEART CATH;  Surgeon: [name], MD;  Location: [facility];  Service: Cardiovascular;  Laterality: N/A;   TOTAL ABDOMINAL HYSTERECTOMY W/ BILATERAL SALPINGOOPHORECTOMY  1989   TRANSTHORACIC ECHOCARDIOGRAM  08/29/2012   Moderately calcified  annulus of mitral valve, moderate regurg. of both mitral valve and tricuspid valve.     SOCIAL HISTORY: Social History   Socioeconomic History   Marital status: Widowed    Spouse name: Not on file   Number of children: Not  on file   Years of education: Not on file   Highest education level: Not on file  Occupational History   Occupation: Retired  Tobacco Use   Smoking status: Former    Packs/day: 1.00    Years: 30.00    Pack years: 30.00    Types: Cigarettes   Smokeless tobacco: Never   Tobacco comments:    quit smoking 2005  Vaping Use   Vaping Use: Never used  Substance and Sexual Activity   Alcohol use: No    Alcohol/week: 0.0 standard drinks   Drug use: No   Sexual activity: Not Currently    Birth control/protection: None  Other Topics Concern   Not on file  Social History Narrative   Not on file   Social Determinants of Health   Financial Resource Strain: Low Risk    Difficulty of Paying Living Expenses: Not hard at all  Food Insecurity: No Food Insecurity   Worried About Charity fundraiser in the Last Year: Never true   [place] in the Last Year: Never true  Transportation Needs: No Transportation Needs   Lack of Transportation (Medical): No   Lack of Transportation (Non-Medical): No  Physical Activity: Inactive   Days of Exercise per Week: 0 days   Minutes of Exercise per Session: 0 min  Stress: No Stress Concern Present   Feeling of Stress : Only a little  Social Connections: Not on file  Intimate Partner Violence: Not At Risk   Fear of Current or Ex-Partner: No   Emotionally Abused: No   Physically Abused: No   Sexually Abused: No    FAMILY HISTORY: Family History  Problem Relation Age of Onset   Heart disease Father    Hypertension Father    Hyperlipidemia Father    Breast cancer Neg Hx     Review of Systems  Constitutional:  Negative for appetite change, chills, fatigue, fever and unexpected weight change.  HENT:   Negative for hearing loss, lump/mass and trouble swallowing.   Eyes:  Negative for eye problems and icterus.  Respiratory:  Negative for chest tightness, cough and shortness of breath.   Cardiovascular:  Positive for leg swelling. Negative  for chest pain and palpitations.  Gastrointestinal:  Negative for abdominal distention, abdominal pain, constipation, diarrhea, nausea and vomiting.  Endocrine: Negative for hot flashes.  Genitourinary:  Negative for difficulty urinating.   Musculoskeletal:  Negative for arthralgias.  Skin:  Negative for itching and rash.  Neurological:  Negative for dizziness, extremity weakness, headaches and numbness.  Hematological:  Negative for adenopathy. Does not bruise/bleed easily.  Psychiatric/Behavioral:  Negative for depression. The patient is not nervous/anxious.      PHYSICAL EXAMINATION  ECOG PERFORMANCE STATUS: 1 - Symptomatic but completely ambulatory  Vitals:   03/07/22 1400  BP: 140/76  Pulse: 65  Resp: 18  Temp: 97.7 F (36.5 C)  SpO2: 97%    Physical Exam Constitutional:      General: She is not in acute distress.    Appearance: Normal appearance. She is not toxic-appearing.  HENT:     Head: Normocephalic and atraumatic.  Eyes:     General: No scleral icterus. Cardiovascular:     Rate and  Rhythm: Normal rate and regular rhythm.     Pulses: Normal pulses.     Heart sounds: Murmur (click) heard.  Pulmonary:     Effort: Pulmonary effort is normal.     Breath sounds: Normal breath sounds.  Abdominal:     General: Abdomen is flat. Bowel sounds are normal. There is no distension.     Palpations: Abdomen is soft.     Tenderness: There is no abdominal tenderness.  Musculoskeletal:        General: Swelling present.     Cervical back: Neck supple.     Comments: Legs are wrapped and improved from prior, however faint purpura noted in the dorsal region of the left foot.    Lymphadenopathy:     Cervical: No cervical adenopathy.  Skin:    General: Skin is warm and dry.     Findings: No rash.  Neurological:     General: No focal deficit present.     Mental Status: She is alert.  Psychiatric:        Mood and Affect: Mood normal.        Behavior: Behavior normal.     LABORATORY DATA:  CBC    Component Value Date/Time   WBC 4.9 03/07/2022 1321   WBC 3.7 (L) 02/17/2022 0820   RBC 2.87 (L) 03/07/2022 1321   RBC 2.80 (L) 03/07/2022 1320   HGB 9.1 (L) 03/07/2022 1321   HCT 27.8 (L) 03/07/2022 1321   HCT 28.5 (L) 11/20/2018 1824   PLT 117 (L) 03/07/2022 1321   MCV 96.9 03/07/2022 1321   MCH 31.7 03/07/2022 1321   MCHC 32.7 03/07/2022 1321   RDW 16.7 (H) 03/07/2022 1321   LYMPHSABS 0.3 (L) 03/07/2022 1321   MONOABS 0.2 03/07/2022 1321   EOSABS 0.0 03/07/2022 1321   BASOSABS 0.0 03/07/2022 1321    CMP     Component Value Date/Time   NA 129 (L) 03/07/2022 1321   NA 137 12/06/2017 1436   K 5.3 (H) 03/07/2022 1321   CL 99 03/07/2022 1321   CO2 24 03/07/2022 1321   GLUCOSE 224 (H) 03/07/2022 1321   BUN 104 (H) 03/07/2022 1321   BUN 20 12/06/2017 1436   CREATININE 2.49 (H) 03/07/2022 1321   CALCIUM 9.0 03/07/2022 1321   PROT 5.8 (L) 03/07/2022 1321   ALBUMIN 3.5 03/07/2022 1321   AST 34 03/07/2022 1321   ALT 29 03/07/2022 1321   ALKPHOS 67 03/07/2022 1321   BILITOT 0.6 03/07/2022 1321   GFRNONAA 19 (L) 03/07/2022 1321   GFRAA 32 (L) 03/30/2020 0932       ASSESSMENT and THERAPY PLAN:   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 (HCC) [name] is here today for her refractory anemia.  Her hemoglobin remained stable at 9.1 today.  She is taking Prednisone 44m daily and also the CellCept daily.    Her blood sugars are improved from prior and she will continue the sliding scale insulin as indicated and prescribed.  Her INR is 2 and she is following with the Coumadin clinic with her cardiologist.   DAzaniwill return on Friday for f/u with Dr. IChryl Heck  We will get a ferritin at that visit.     All questions were answered. The patient knows to call the clinic with any problems, questions or concerns. We can certainly see the patient much sooner if necessary.  Total encounter time:20 minutes*in face-to-face visit  time, chart review, lab review, care coordination, order entry, and documentation of  the encounter time.   Wilber Bihari, NP 03/08/22 6:32 AM Medical Oncology and Hematology [facility] [place], [place] [address] Tel. [phone]    Fax. [phone]  *Total Encounter Time as defined by the Centers for Medicare and Medicaid Services includes, in addition to the face-to-face time of a patient visit (documented in the note above) non-face-to-face time: obtaining and reviewing outside history, ordering and reviewing medications, tests or procedures, care coordination (communications with other health care professionals or caregivers) and documentation in the medical record.

## 2022-03-08 NOTE — Assessment & Plan Note (Signed)
[name] is here today for her refractory anemia.  Her hemoglobin remained stable at 9.1 today.  She is taking Prednisone '10mg'$  daily and also the CellCept daily.    Her blood sugars are improved from prior and she will continue the sliding scale insulin as indicated and prescribed.  Her INR is 2 and she is following with the Coumadin clinic with her cardiologist.   [name] will return on Friday for f/u with Dr. Chryl Heck.  We will get a ferritin at that visit.

## 2022-03-09 ENCOUNTER — Other Ambulatory Visit (HOSPITAL_COMMUNITY): Payer: Self-pay

## 2022-03-10 ENCOUNTER — Inpatient Hospital Stay (HOSPITAL_BASED_OUTPATIENT_CLINIC_OR_DEPARTMENT_OTHER): Payer: Medicare HMO | Admitting: Hematology and Oncology

## 2022-03-10 ENCOUNTER — Other Ambulatory Visit: Payer: Medicare HMO

## 2022-03-10 ENCOUNTER — Other Ambulatory Visit: Payer: Self-pay

## 2022-03-10 ENCOUNTER — Other Ambulatory Visit: Payer: Self-pay | Admitting: *Deleted

## 2022-03-10 ENCOUNTER — Inpatient Hospital Stay: Payer: Medicare HMO | Attending: Oncology

## 2022-03-10 ENCOUNTER — Encounter: Payer: Self-pay | Admitting: Hematology and Oncology

## 2022-03-10 VITALS — BP 127/65 | HR 68 | Temp 98.1°F | Resp 16 | Ht 59.0 in | Wt 139.6 lb

## 2022-03-10 DIAGNOSIS — E1122 Type 2 diabetes mellitus with diabetic chronic kidney disease: Secondary | ICD-10-CM | POA: Diagnosis not present

## 2022-03-10 DIAGNOSIS — N184 Chronic kidney disease, stage 4 (severe): Secondary | ICD-10-CM | POA: Insufficient documentation

## 2022-03-10 DIAGNOSIS — I7782 Antineutrophilic cytoplasmic antibody (ANCA) vasculitis: Secondary | ICD-10-CM | POA: Diagnosis not present

## 2022-03-10 DIAGNOSIS — Z794 Long term (current) use of insulin: Secondary | ICD-10-CM | POA: Diagnosis not present

## 2022-03-10 DIAGNOSIS — M7989 Other specified soft tissue disorders: Secondary | ICD-10-CM | POA: Insufficient documentation

## 2022-03-10 DIAGNOSIS — M8580 Other specified disorders of bone density and structure, unspecified site: Secondary | ICD-10-CM | POA: Insufficient documentation

## 2022-03-10 DIAGNOSIS — D591 Autoimmune hemolytic anemia, unspecified: Secondary | ICD-10-CM | POA: Insufficient documentation

## 2022-03-10 DIAGNOSIS — D594 Other nonautoimmune hemolytic anemias: Secondary | ICD-10-CM

## 2022-03-10 DIAGNOSIS — E1165 Type 2 diabetes mellitus with hyperglycemia: Secondary | ICD-10-CM | POA: Diagnosis not present

## 2022-03-10 DIAGNOSIS — I6523 Occlusion and stenosis of bilateral carotid arteries: Secondary | ICD-10-CM

## 2022-03-10 DIAGNOSIS — I130 Hypertensive heart and chronic kidney disease with heart failure and stage 1 through stage 4 chronic kidney disease, or unspecified chronic kidney disease: Secondary | ICD-10-CM | POA: Diagnosis not present

## 2022-03-10 LAB — CMP (CANCER CENTER ONLY)
ALT: 22 U/L (ref 0–44)
AST: 28 U/L (ref 15–41)
Albumin: 3 g/dL — ABNORMAL LOW (ref 3.5–5.0)
Alkaline Phosphatase: 60 U/L (ref 38–126)
Anion gap: 10 (ref 5–15)
BUN: 106 mg/dL — ABNORMAL HIGH (ref 8–23)
CO2: 19 mmol/L — ABNORMAL LOW (ref 22–32)
Calcium: 7.8 mg/dL — ABNORMAL LOW (ref 8.9–10.3)
Chloride: 104 mmol/L (ref 98–111)
Creatinine: 2.53 mg/dL — ABNORMAL HIGH (ref 0.44–1.00)
GFR, Estimated: 18 mL/min — ABNORMAL LOW (ref >60)
Glucose, Bld: 220 mg/dL — ABNORMAL HIGH (ref 70–99)
Potassium: 4.5 mmol/L (ref 3.5–5.1)
Sodium: 133 mmol/L — ABNORMAL LOW (ref 135–145)
Total Bilirubin: 0.8 mg/dL (ref 0.3–1.2)
Total Protein: 5.3 g/dL — ABNORMAL LOW (ref 6.5–8.1)

## 2022-03-10 LAB — CBC WITH DIFFERENTIAL/PLATELET
Abs Immature Granulocytes: 0.07 10*3/uL (ref 0.00–0.07)
Basophils Absolute: 0 10*3/uL (ref 0.0–0.1)
Basophils Relative: 0 %
Eosinophils Absolute: 0 10*3/uL (ref 0.0–0.5)
Eosinophils Relative: 1 %
HCT: 24.2 % — ABNORMAL LOW (ref 36.0–46.0)
Hemoglobin: 8.1 g/dL — ABNORMAL LOW (ref 12.0–15.0)
Immature Granulocytes: 2 %
Lymphocytes Relative: 8 %
Lymphs Abs: 0.3 10*3/uL — ABNORMAL LOW (ref 0.7–4.0)
MCH: 32 pg (ref 26.0–34.0)
MCHC: 33.5 g/dL (ref 30.0–36.0)
MCV: 95.7 fL (ref 80.0–100.0)
Monocytes Absolute: 0.2 10*3/uL (ref 0.1–1.0)
Monocytes Relative: 5 %
Neutro Abs: 3.2 10*3/uL (ref 1.7–7.7)
Neutrophils Relative %: 84 %
Platelets: 98 10*3/uL — ABNORMAL LOW (ref 150–400)
RBC: 2.53 MIL/uL — ABNORMAL LOW (ref 3.87–5.11)
RDW: 16.6 % — ABNORMAL HIGH (ref 11.5–15.5)
WBC: 3.8 10*3/uL — ABNORMAL LOW (ref 4.0–10.5)
nRBC: 0 % (ref 0.0–0.2)

## 2022-03-10 LAB — RETIC PANEL
Immature Retic Fract: 6.5 % (ref 2.3–15.9)
RBC.: 2.52 MIL/uL — ABNORMAL LOW (ref 3.87–5.11)
Retic Count, Absolute: 117.9 10*3/uL (ref 19.0–186.0)
Retic Ct Pct: 4.7 % — ABNORMAL HIGH (ref 0.4–3.1)
Reticulocyte Hemoglobin: 35.1 pg (ref >27.9)

## 2022-03-10 LAB — FERRITIN: Ferritin: 157 ng/mL (ref 11–307)

## 2022-03-10 LAB — PROTIME-INR
INR: 1.9 — ABNORMAL HIGH (ref 0.8–1.2)
Prothrombin Time: 21.9 seconds — ABNORMAL HIGH (ref 11.4–15.2)

## 2022-03-10 NOTE — Progress Notes (Signed)
[facility] Follow up:    [name], [name], MD [place] [address]  DIAGNOSIS:   SUMMARY OF HEMATOLOGIC HISTORY: 81 y.o. [place] woman with multifactorial anemia, as follows:             (a) anemia of chronic inflammation: As of May 2020 she has a SED rate of 95, CRP 1.1, positive ANA and RF; subsequently she was found to be ANCA positive             (b) hemolysis (mild): she has a mildly elevated LDH and a DAT positive for complement, not IgG; this is c/w (a) above             (c) anemia of renal insufficiency: inadequate EPO resonse to anemia                         (d) GIB/ iron deficiency in patient on lifelong anticoagulaton   (1) Feraheme received 03/01/2019, repeated 03/20/2019              (a) reticulocyte 03/07/2019 up from 43.4 a year ago to 191.8 after iron infusion             (b) feraheme repeated on 05/12/2019             (c) subsequent ferritin levels >100   (2) darbepoetin: starting 05/05/2019 at 124mg given every 2 weeks             (a) dose increased to 200 mcg every week starting 07/01/2019              (b) back to every 2 weeks beginning 08/04/2019 still at 200 mcg dose             (c) switched to Retacrit every 4 weeks as of 08/18/2019   (3) ANCA positive vasculitis: diagnosed 05/08/2019 (titer 1:640)             (a) prednisone 40 mg daily started 05/31/2019             (b) rituximab weekly started 06/12/2019 (received test dose 06/05/2019)                         (i) hepatitis B sAg, core Ab and HIV negative 05/08/2019                         (ii) rituximab held after 07/14/2019 dose (received 5 weekly doses) with neutropenia                         (iii) rituximab resumed 08/18/2019 but changed to monthly                         (iv) rituximab changed to every 2 months beginning with 02/02/2020 dose                         (v) rituximab changed to every 12 weeks after the 10/12/2020 dose             (c) prednisone  tapered to off as of 09/15/2019   (4) osteopenia with a T score of -1.5 on bone density 02/28/2016 at the breast center  CURRENT THERAPY: Prednisone and MMF  INTERVAL HISTORY:  DVIANNE GRIESHOP89y.o. female returns for her follow-up  of anemia.  Ms. Arpin is here for follow-up.  Given history of ANCA positive autoimmune hemolytic anemia, she received 8 weekly infusions of rituximab, currently continues on prednisone.  She relapsed twice when we tried to taper her off of prednisone. She was slowly tapered third time and is here for FU on 10 mg of Prednisone. She is on MMF as well at 500 mg po daily. She is feeling very poorly, tired no energy. No bleeding. She says MMF is easy. She has noticed itching and pain of her private areas.  No dysuria or increased frequency. Rest of  the pertinent 10 point ROS reviewed and negative.   Patient Active Problem List   Diagnosis Date Noted   Hyperkalemia 02/04/2022   Acute renal failure superimposed on stage 3b chronic kidney disease ([place]) 02/04/2022   Hyperglycemia 01/09/2022   Autoimmune hemolytic anemia (HCC) 01/02/2022   Bilateral lower extremity edema 01/02/2022   Refractory anemia ([place]) 12/15/2021   Abnormal chest x-ray 12/15/2021   Supratherapeutic INR 12/15/2021   Elevated troponin 12/15/2021   Unilateral primary osteoarthritis, right hip 05/31/2021   Midline cystocele 05/30/2021   Primary localized osteoarthritis of pelvic region and thigh 05/30/2021   Vaginal pain 05/30/2021   Asymmetric SNHL (sensorineural hearing loss) 01/06/2021   Referred otalgia of both ears 01/06/2021   Bilateral hearing loss 08/31/2020   Low back pain 02/20/2020   CKD (chronic kidney disease), stage IV ([place]) 10/13/2019   Leukopenia due to antineoplastic chemotherapy ([place]) 08/18/2019   Thrombophilia ([place]) 06/19/2019   Encounter for general adult medical examination without abnormal findings 06/13/2019   Arteritis ([place]) 06/06/2019   Hypertensive heart and  chronic kidney disease with heart failure and stage 1 through stage 4 chronic kidney disease, or unspecified chronic kidney disease ([place]) 06/06/2019   Muscle weakness 06/06/2019   Pancytopenia ([place]) 05/28/2019   Dyspnea 05/28/2019   Vasculitis, ANCA positive 05/28/2019   Disorder of connective tissue ([place]) 05/14/2019   Renal insufficiency 05/08/2019   Acute on chronic diastolic CHF (congestive heart failure) ([place]) 04/07/2019   Left leg cellulitis 04/07/2019   PAF (paroxysmal atrial fibrillation) ([place]) 04/07/2019   Iron deficiency anemia 02/28/2019   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 ([place]) 02/28/2019   Diabetic retinopathy associated with type 2 diabetes mellitus ([place]) 09/02/2018   Chronic pain 06/19/2018   Non-thrombocytopenic purpura ([place]) 05/21/2018   Gout 03/22/2018   Malnutrition of moderate degree 03/12/2018   Diabetes mellitus type 2, controlled ([place]) 03/06/2018   S/P cholecystectomy 03/06/2018   HTN (hypertension) 03/06/2018   H/O mechanical aortic valve replacement 03/06/2018   Moderate to severe pulmonary hypertension ([place]) 03/06/2018   GERD (gastroesophageal reflux disease) 02/21/2018   Anxiety 02/21/2018   Chronic diastolic CHF (congestive heart failure) ([place]) 02/21/2018   Diarrhea 02/21/2018   General weakness 02/02/2018   Thrombocytopenia ([place]) 01/25/2018   Arm pain, diffuse 01/25/2018   Anemia 01/23/2018   Aortic atherosclerosis ([place]) 01/23/2018   GI bleed 01/18/2018   Age-related osteoporosis without current pathological fracture 05/24/2017   Carotid artery occlusion 05/24/2017   Generalized anxiety disorder 05/24/2017   Hearing loss 05/24/2017   Presence of prosthetic heart valve 05/24/2017   Carotid artery disease ([place]) 08/18/2013   Peripheral arterial disease ([place]) 08/18/2013   Diabetes mellitus without complication ([place]) 51/88/4166   Hyponatremia 10/06/2011   Hypertension    Hypercholesteremia    Mechanical heart valve present     Chronic anticoagulation     is allergic to flagyl [metronidazole], ciprofloxacin, coreg [carvedilol],  diflucan [fluconazole], losartan, privigen [immune globulin (human)], sulfamethoxazole-trimethoprim, verapamil, zetia [ezetimibe], zocor [simvastatin - high dose], and gatifloxacin.  MEDICAL HISTORY: Past Medical History:  Diagnosis Date   Aortic atherosclerosis ([place]) 01/23/2018   Atrial fibrillation (HCC)    Benign positional vertigo 10/06/2011   Carotid artery disease ([place])    Chemotherapy induced neutropenia ([place]) 07/21/2019   Cholelithiasis 01/23/2018   Cholelithiasis 01/23/2018   Chronic anticoagulation    Coronary artery disease    status post coronary artery bypass grafting times 07/10/2004   Diabetes mellitus    Hypercholesteremia    Hypertension    Mechanical heart valve present    H. aortic valve replacement at the time of bypass surgery October 2005   Moderate to severe pulmonary hypertension ([place])    Peripheral arterial disease ([place])    history of left common iliac artery PTA and stenting for a chronic total occlusion 08/26/01    SURGICAL HISTORY: Past Surgical History:  Procedure Laterality Date   anterior repair  2009   AORTIC VALVE REPLACEMENT  2005   Dr. Cyndia Bent   CARDIAC CATHETERIZATION  11/10/2004   40% right common illiac, 70% in stent restenosis of distal left common illiac,    CARDIAC CATHETERIZATION  05/18/2004   LAD 50-70% midstenosis, RCA dominant w/50% stenosis, 50% Right common Illiac artery ostial stenosis, 90% in stent restenosis within midportion of left common illiac stent   Carotid Duplex  03/12/2012   RSA-elev. velocities suggestive of a 50-69% diameter reduction, Right&Left Bulb/Prox ICA-mild-mod.fibrous plaqueelevating Velocities abnormal study.   CHOLECYSTECTOMY N/A 03/01/2018   Procedure: LAPAROSCOPIC CHOLECYSTECTOMY;  Surgeon: Judeth Horn, MD;  Location: [facility];  Service: General;  Laterality: N/A;   COLONOSCOPY WITH PROPOFOL N/A 01/22/2018    Procedure: COLONOSCOPY WITH PROPOFOL;  Surgeon: Wilford Corner, MD;  Location: [facility];  Service: Endoscopy;  Laterality: N/A;   ESOPHAGOGASTRODUODENOSCOPY (EGD) WITH PROPOFOL N/A 01/22/2018   Procedure: ESOPHAGOGASTRODUODENOSCOPY (EGD) WITH PROPOFOL;  Surgeon: Wilford Corner, MD;  Location: [facility];  Service: Endoscopy;  Laterality: N/A;   ESOPHAGOGASTRODUODENOSCOPY (EGD) WITH PROPOFOL N/A 11/21/2018   Procedure: ESOPHAGOGASTRODUODENOSCOPY (EGD) WITH PROPOFOL;  Surgeon: [name], MD;  Location: [facility];  Service: Gastroenterology;  Laterality: N/A;   ESOPHAGOGASTRODUODENOSCOPY (EGD) WITH PROPOFOL Left 02/27/2019   Procedure: ESOPHAGOGASTRODUODENOSCOPY (EGD) WITH PROPOFOL;  Surgeon: Arta Silence, MD;  [facility];  Service: Endoscopy;  Laterality: Left;   GIVENS CAPSULE STUDY N/A 11/21/2018   Procedure: GIVENS CAPSULE STUDY;  Surgeon: [name], MD;  Location: [facility];  Service: Gastroenterology;  Laterality: N/A;  To be deployed during EGD   Lower Ext. Duplex  03/12/2012   Right Proximal CIA- vessel narrowing w/elevated velocities 0-49% diameter reduction. Right SFA-mild mixed density plaque throughout vessel.   NM MYOCAR PERF WALL MOTION  05/19/2010   protocol: Persantine, post stress EF 65%, negative for ischemia, low risk scan   RIGHT HEART CATH N/A 06/27/2018   Procedure: RIGHT HEART CATH;  Surgeon: [name], MD;  Location: [facility];  Service: Cardiovascular;  Laterality: N/A;   TOTAL ABDOMINAL HYSTERECTOMY W/ BILATERAL SALPINGOOPHORECTOMY  1989   TRANSTHORACIC ECHOCARDIOGRAM  08/29/2012   Moderately calcified annulus of mitral valve, moderate regurg. of both mitral valve and tricuspid valve.     SOCIAL HISTORY: Social History   Socioeconomic History   Marital status: Widowed    Spouse name: Not on file   Number of children: Not on file   Years of education: Not on file   Highest education level: Not  on file  Occupational  History   Occupation: Retired  Tobacco Use   Smoking status: Former    Packs/day: 1.00    Years: 30.00    Pack years: 30.00    Types: Cigarettes   Smokeless tobacco: Never   Tobacco comments:    quit smoking 2005  Vaping Use   Vaping Use: Never used  Substance and Sexual Activity   Alcohol use: No    Alcohol/week: 0.0 standard drinks   Drug use: No   Sexual activity: Not Currently    Birth control/protection: None  Other Topics Concern   Not on file  Social History Narrative   Not on file   Social Determinants of Health   Financial Resource Strain: Low Risk    Difficulty of Paying Living Expenses: Not hard at all  Food Insecurity: No Food Insecurity   Worried About Charity fundraiser in the Last Year: Never true   [place] in the Last Year: Never true  Transportation Needs: No Transportation Needs   Lack of Transportation (Medical): No   Lack of Transportation (Non-Medical): No  Physical Activity: Inactive   Days of Exercise per Week: 0 days   Minutes of Exercise per Session: 0 min  Stress: No Stress Concern Present   Feeling of Stress : Only a little  Social Connections: Not on file  Intimate Partner Violence: Not At Risk   Fear of Current or Ex-Partner: No   Emotionally Abused: No   Physically Abused: No   Sexually Abused: No    FAMILY HISTORY: Family History  Problem Relation Age of Onset   Heart disease Father    Hypertension Father    Hyperlipidemia Father    Breast cancer Neg Hx     Review of Systems  Constitutional:  Positive for fatigue. Negative for appetite change, chills, fever and unexpected weight change.  HENT:   Negative for hearing loss, lump/mass and trouble swallowing.   Eyes:  Negative for eye problems and icterus.  Respiratory:  Positive for shortness of breath. Negative for chest tightness and cough.   Cardiovascular:  Positive for leg swelling. Negative for chest pain and palpitations.  Gastrointestinal:  Negative for  abdominal distention, abdominal pain, constipation, diarrhea, nausea and vomiting.  Endocrine: Negative for hot flashes.  Genitourinary:  Negative for difficulty urinating.   Musculoskeletal:  Negative for arthralgias.  Skin:  Negative for itching and rash.  Neurological:  Negative for dizziness, extremity weakness, headaches and numbness.  Hematological:  Negative for adenopathy. Does not bruise/bleed easily.  Psychiatric/Behavioral:  Negative for depression. The patient is not nervous/anxious.      PHYSICAL EXAMINATION  ECOG PERFORMANCE STATUS: 1 - Symptomatic but completely ambulatory  Vitals:   03/10/22 1157  BP: 127/65  Pulse: 68  Resp: 16  Temp: 98.1 F (36.7 C)  SpO2: 98%    Physical Exam Constitutional:      General: She is not in acute distress.    Appearance: Normal appearance. She is not toxic-appearing.  HENT:     Head: Normocephalic and atraumatic.  Eyes:     General: No scleral icterus. Cardiovascular:     Rate and Rhythm: Normal rate and regular rhythm.     Pulses: Normal pulses.     Heart sounds: Normal heart sounds.  Pulmonary:     Effort: Pulmonary effort is normal.     Breath sounds: Normal breath sounds.  Abdominal:     General: Abdomen is flat. Bowel sounds are normal.  There is no distension.     Palpations: Abdomen is soft.     Tenderness: There is no abdominal tenderness. There is no guarding.  Musculoskeletal:        General: Swelling (She has her legs wrapped in Ace bandages today.  The feet appear very swollen) present.     Cervical back: Neck supple.  Lymphadenopathy:     Cervical: No cervical adenopathy.  Skin:    General: Skin is warm and dry.     Findings: Bruising present. No rash.  Neurological:     General: No focal deficit present.     Mental Status: She is alert.  Psychiatric:        Mood and Affect: Mood normal.        Behavior: Behavior normal.    LABORATORY DATA:  CBC    Component Value Date/Time   WBC 3.8 (L)  03/10/2022 1134   RBC 2.53 (L) 03/10/2022 1134   RBC 2.52 (L) 03/10/2022 1134   HGB 8.1 (L) 03/10/2022 1134   HGB 9.1 (L) 03/07/2022 1321   HCT 24.2 (L) 03/10/2022 1134   HCT 28.5 (L) 11/20/2018 1824   PLT 98 (L) 03/10/2022 1134   PLT 117 (L) 03/07/2022 1321   MCV 95.7 03/10/2022 1134   MCH 32.0 03/10/2022 1134   MCHC 33.5 03/10/2022 1134   RDW 16.6 (H) 03/10/2022 1134   LYMPHSABS 0.3 (L) 03/10/2022 1134   MONOABS 0.2 03/10/2022 1134   EOSABS 0.0 03/10/2022 1134   BASOSABS 0.0 03/10/2022 1134    CMP     Component Value Date/Time   NA 129 (L) 03/07/2022 1321   NA 137 12/06/2017 1436   K 5.3 (H) 03/07/2022 1321   CL 99 03/07/2022 1321   CO2 24 03/07/2022 1321   GLUCOSE 224 (H) 03/07/2022 1321   BUN 104 (H) 03/07/2022 1321   BUN 20 12/06/2017 1436   CREATININE 2.49 (H) 03/07/2022 1321   CALCIUM 9.0 03/07/2022 1321   PROT 5.8 (L) 03/07/2022 1321   ALBUMIN 3.5 03/07/2022 1321   AST 34 03/07/2022 1321   ALT 29 03/07/2022 1321   ALKPHOS 67 03/07/2022 1321   BILITOT 0.6 03/07/2022 1321   GFRNONAA 19 (L) 03/07/2022 1321   GFRAA 32 (L) 03/30/2020 0932     ASSESSMENT and THERAPY PLAN:   This is a very pleasant 81 year old female patient with multifactorial anemia, previously treated with rituximab for ANCA positive hemolytic anemia, previously on maintenance rituximab every 12 weeks presented with worsening anemia thought to be related to her hemolysis.   She was diagnosed with elevated ANCA titers 1 is to 640 and previously treated for ANCA positive hemolytic anemia, seen by rheumatology Dr. Salome Holmes back in 2020, responded at that time to prednisone and rituximab and continued on rituximab maintenance.  However her rituximab was changed to every 39-monthinfusions and she relapsed before her second cycle of every 328-monthituximab.    She received 8 weekly cycles of rituximab so far.  She is currently on prednisone 10 mg.  She happened to relapse again with worsening  hemolysis. She continues on MMF 500 mg po daily. At this time, we agreed to increase her prednisone to 15 mg po daily. Her hyperglycemia has been hard to control with chronic prednisone use.  [facility]. No findings on bone marrow to explain this. CT imaging without any clear explanation ANCA titres are normal.  She agrees to  second opinion In the mean time, I will increase the MMF to 1000 mg po daily, lab monitoring twice a week and transfusions as needed.  With regards to vulvar pain and itching, she may be having candidiasis from hyperglycemia, she will try clotrimazole wipes.  Total encounter time: 65mnutes in face-to-face visit time, chart review, lab review, care coordination, discussion with other providers, discussion with nursing, patient education, and documentation of the encounter.

## 2022-03-12 NOTE — Progress Notes (Signed)
Subjective:  Patient ID: [name], female    DOB: 05/09/1941,  MRN: [id number]  SHAQUELLA STAMANT presents to clinic today for at risk foot care. Pt has h/o NIDDM with PAD and painful thick toenails that are difficult to trim. Pain interferes with ambulation. Aggravating factors include wearing enclosed shoe gear. Pain is relieved with periodic professional debridement.  Patient states blood glucose was 200 mg/dl today.  Last known HgA1c was 6.6%.  Patient states she had her legs wrapped on yesterday by Dr. Carlis Abbott (Vascular Surgery). She states her swelling is due to prednisone use.  New problem(s): None.   PCP is [name], [name], MD , and last visit was Mar 07, 2022.  Allergies  Allergen Reactions   Flagyl [Metronidazole] Rash    ALL-OVER BODY RASH   Ciprofloxacin     Other reaction(s): Stomach upset - tolerates Other reaction(s): Stomach upset - tolerates   Coreg [Carvedilol] Other (See Comments)    Terrible cramping in the feet and had a lot of bowel movements, but not diarrhea   Diflucan [Fluconazole] Diarrhea   Losartan Swelling    Patient doesn't recall site of swelling   Privigen [Immune Globulin (Human)] Other (See Comments)    Severe/excruciating pain   Sulfamethoxazole-Trimethoprim     Other reaction(s): stomach upset .   Verapamil Hives   Zetia [Ezetimibe] Other (See Comments)    Reaction not recalled   Zocor [Simvastatin - High Dose] Other (See Comments)    Reaction not recalled   Gatifloxacin Rash    Redness to skin around eye    Review of Systems: Negative except as noted in the HPI.  Objective: AMERIE BEAUMONT is a pleasant 81 y.o. female in NAD. AAO X 3.  Vascular Examination: CFT <3 seconds b/l LE. Faintly palpable DP pulses b/l LE. Faintly palpable PT pulse(s) b/l LE. Pedal hair absent. No pain with calf compression b/l. Lower extremity skin temperature gradient warm to cool. Patient wearing bilateral ace bandage wraps on today's visit. Pedal edema +2  noted BLE. No ischemia or gangrene noted b/l LE. No cyanosis or clubbing noted b/l LE.  Dermatological Examination: Pedal skin thin, shiny and atrophic b/l LE. No open wounds b/l LE. No interdigital macerations noted b/l LE. Toenails 1-5 b/l elongated, discolored, dystrophic, thickened, crumbly with subungual debris and tenderness to dorsal palpation. Split nail left great toenail distal 1/3 of nail plate. No erythema, no edema, no drainage, no fluctuance. No hyperkeratotic nor porokeratotic lesions present on today's visit.  Musculoskeletal Examination: Muscle strength 5/5 to all lower extremity muscle groups bilaterally. No pain, crepitus or joint limitation noted with ROM bilateral LE. Tailor's bunion deformity noted b/l LE. Patient ambulates independent of any assistive aids. Wearing open toed sandals with memory foam insoles and velcro straps which are adequate for her feet.  Neurological Examination: Protective sensation intact 5/5 intact bilaterally with 10g monofilament b/l. Vibratory sensation intact b/l.    Latest Ref Rng & Units 02/04/2022    8:00 PM  Hemoglobin A1C  Hemoglobin-A1c 4.8 - 5.6 % 6.6     Assessment/Plan: 1. Pain due to onychomycosis of toenails of both feet   2. Type II diabetes mellitus with peripheral circulatory disorder [facility])     -Patient was evaluated and treated. All patient's and/or POA's questions/concerns answered on today's visit. -Patient with episode of LE edema being managed by Dr. Carlis Abbott in Vascular Surgery. Wearing bilateral leg wraps. -Patient to continue soft, supportive shoe gear daily. -Split nail left hallux  debrided to level of adherence. Split nail most likely due to chronic anemia. Mycotic toenails 2-5 bilaterally and R hallux were debrided in length and girth with sterile nail nippers and dremel without iatrogenic bleeding. -Patient/POA to call should there be question/concern in the interim.   Return in about 9 weeks (around  05/10/2022).  Marzetta Board, DPM

## 2022-03-13 ENCOUNTER — Inpatient Hospital Stay: Payer: Medicare HMO

## 2022-03-13 ENCOUNTER — Ambulatory Visit (INDEPENDENT_AMBULATORY_CARE_PROVIDER_SITE_OTHER): Payer: Medicare HMO

## 2022-03-13 ENCOUNTER — Other Ambulatory Visit: Payer: Self-pay

## 2022-03-13 ENCOUNTER — Other Ambulatory Visit (HOSPITAL_COMMUNITY): Payer: Self-pay

## 2022-03-13 ENCOUNTER — Other Ambulatory Visit: Payer: Self-pay | Admitting: Hematology and Oncology

## 2022-03-13 ENCOUNTER — Other Ambulatory Visit: Payer: Self-pay | Admitting: Adult Health

## 2022-03-13 ENCOUNTER — Telehealth: Payer: Self-pay

## 2022-03-13 DIAGNOSIS — M8580 Other specified disorders of bone density and structure, unspecified site: Secondary | ICD-10-CM | POA: Diagnosis not present

## 2022-03-13 DIAGNOSIS — D591 Autoimmune hemolytic anemia, unspecified: Secondary | ICD-10-CM

## 2022-03-13 DIAGNOSIS — M7989 Other specified soft tissue disorders: Secondary | ICD-10-CM | POA: Diagnosis not present

## 2022-03-13 DIAGNOSIS — Z5181 Encounter for therapeutic drug level monitoring: Secondary | ICD-10-CM | POA: Diagnosis not present

## 2022-03-13 DIAGNOSIS — D594 Other nonautoimmune hemolytic anemias: Secondary | ICD-10-CM

## 2022-03-13 DIAGNOSIS — Z794 Long term (current) use of insulin: Secondary | ICD-10-CM | POA: Diagnosis not present

## 2022-03-13 DIAGNOSIS — E1122 Type 2 diabetes mellitus with diabetic chronic kidney disease: Secondary | ICD-10-CM | POA: Diagnosis not present

## 2022-03-13 DIAGNOSIS — N184 Chronic kidney disease, stage 4 (severe): Secondary | ICD-10-CM | POA: Diagnosis not present

## 2022-03-13 DIAGNOSIS — I130 Hypertensive heart and chronic kidney disease with heart failure and stage 1 through stage 4 chronic kidney disease, or unspecified chronic kidney disease: Secondary | ICD-10-CM | POA: Diagnosis not present

## 2022-03-13 DIAGNOSIS — I7782 Antineutrophilic cytoplasmic antibody (ANCA) vasculitis: Secondary | ICD-10-CM | POA: Diagnosis not present

## 2022-03-13 DIAGNOSIS — E1165 Type 2 diabetes mellitus with hyperglycemia: Secondary | ICD-10-CM | POA: Diagnosis not present

## 2022-03-13 LAB — CBC WITH DIFFERENTIAL/PLATELET
Abs Immature Granulocytes: 0 10*3/uL (ref 0.00–0.07)
Band Neutrophils: 6 %
Basophils Absolute: 0.1 10*3/uL (ref 0.0–0.1)
Basophils Relative: 2 %
Eosinophils Absolute: 0.1 10*3/uL (ref 0.0–0.5)
Eosinophils Relative: 2 %
HCT: 21.8 % — ABNORMAL LOW (ref 36.0–46.0)
Hemoglobin: 7.2 g/dL — ABNORMAL LOW (ref 12.0–15.0)
Lymphocytes Relative: 10 %
Lymphs Abs: 0.3 10*3/uL — ABNORMAL LOW (ref 0.7–4.0)
MCH: 32 pg (ref 26.0–34.0)
MCHC: 33 g/dL (ref 30.0–36.0)
MCV: 96.9 fL (ref 80.0–100.0)
Metamyelocytes Relative: 1 %
Monocytes Absolute: 0.2 10*3/uL (ref 0.1–1.0)
Monocytes Relative: 6 %
Neutro Abs: 2.7 10*3/uL (ref 1.7–7.7)
Neutrophils Relative %: 73 %
Platelets: 105 10*3/uL — ABNORMAL LOW (ref 150–400)
RBC: 2.25 MIL/uL — ABNORMAL LOW (ref 3.87–5.11)
RDW: 16.9 % — ABNORMAL HIGH (ref 11.5–15.5)
Smear Review: NORMAL
WBC: 3.4 10*3/uL — ABNORMAL LOW (ref 4.0–10.5)
nRBC: 0 % (ref 0.0–0.2)

## 2022-03-13 LAB — RETIC PANEL
Immature Retic Fract: 8.8 % (ref 2.3–15.9)
RBC.: 2.22 MIL/uL — ABNORMAL LOW (ref 3.87–5.11)
Retic Count, Absolute: 102.6 10*3/uL (ref 19.0–186.0)
Retic Ct Pct: 4.6 % — ABNORMAL HIGH (ref 0.4–3.1)
Reticulocyte Hemoglobin: 33.8 pg (ref >27.9)

## 2022-03-13 LAB — CMP (CANCER CENTER ONLY)
ALT: 17 U/L (ref 0–44)
AST: 22 U/L (ref 15–41)
Albumin: 3.4 g/dL — ABNORMAL LOW (ref 3.5–5.0)
Alkaline Phosphatase: 60 U/L (ref 38–126)
Anion gap: 10 (ref 5–15)
BUN: 110 mg/dL — ABNORMAL HIGH (ref 8–23)
CO2: 20 mmol/L — ABNORMAL LOW (ref 22–32)
Calcium: 8.7 mg/dL — ABNORMAL LOW (ref 8.9–10.3)
Chloride: 103 mmol/L (ref 98–111)
Creatinine: 2.32 mg/dL — ABNORMAL HIGH (ref 0.44–1.00)
GFR, Estimated: 20 mL/min — ABNORMAL LOW (ref >60)
Glucose, Bld: 181 mg/dL — ABNORMAL HIGH (ref 70–99)
Potassium: 4.8 mmol/L (ref 3.5–5.1)
Sodium: 133 mmol/L — ABNORMAL LOW (ref 135–145)
Total Bilirubin: 0.5 mg/dL (ref 0.3–1.2)
Total Protein: 5.2 g/dL — ABNORMAL LOW (ref 6.5–8.1)

## 2022-03-13 LAB — PROTIME-INR
INR: 1.9 — ABNORMAL HIGH (ref 0.8–1.2)
Prothrombin Time: 21.6 seconds — ABNORMAL HIGH (ref 11.4–15.2)

## 2022-03-13 LAB — PREPARE RBC (CROSSMATCH)

## 2022-03-13 LAB — SAMPLE TO BLOOD BANK

## 2022-03-13 MED ORDER — SODIUM CHLORIDE 0.9% IV SOLUTION
250.0000 mL | Freq: Once | INTRAVENOUS | Status: AC
  Administered 2022-03-13: 250 mL via INTRAVENOUS

## 2022-03-13 MED ORDER — MYCOPHENOLATE MOFETIL 500 MG PO TABS
1000.0000 mg | ORAL_TABLET | Freq: Every day | ORAL | 1 refills | Status: DC
  Filled 2022-03-13 (×2): qty 60, 30d supply, fill #0
  Filled 2022-04-06: qty 60, 30d supply, fill #1

## 2022-03-13 NOTE — Progress Notes (Signed)
Ordering increased dosage of Cellcept for patient per Dr. Chryl Heck

## 2022-03-13 NOTE — Patient Instructions (Signed)
Blood Transfusion, Adult, Care After This sheet gives you information about how to care for yourself after your procedure. Your doctor may also give you more specific instructions. If you have problems or questions, contact your doctor. What can I expect after the procedure? After the procedure, it is common to have: Bruising and soreness at the IV site. A headache. Follow these instructions at home: Insertion site care     Follow instructions from your doctor about how to take care of your insertion site. This is where an IV tube was put into your vein. Make sure you: Wash your hands with soap and water before and after you change your bandage (dressing). If you cannot use soap and water, use hand sanitizer. Change your bandage as told by your doctor. Check your insertion site every day for signs of infection. Check for: Redness, swelling, or pain. Bleeding from the site. Warmth. Pus or a bad smell. General instructions Take over-the-counter and prescription medicines only as told by your doctor. Rest as told by your doctor. Go back to your normal activities as told by your doctor. Keep all follow-up visits as told by your doctor. This is important. Contact a doctor if: You have itching or red, swollen areas of skin (hives). You feel worried or nervous (anxious). You feel weak after doing your normal activities. You have redness, swelling, warmth, or pain around the insertion site. You have blood coming from the insertion site, and the blood does not stop with pressure. You have pus or a bad smell coming from the insertion site. Get help right away if: You have signs of a serious reaction. This may be coming from an allergy or the body's defense system (immune system). Signs include: Trouble breathing or shortness of breath. Swelling of the face or feeling warm (flushed). Fever or chills. Head, chest, or back pain. Dark pee (urine) or blood in the pee. Widespread rash. Fast  heartbeat. Feeling dizzy or light-headed. You may receive your blood transfusion in an outpatient setting. If so, you will be told whom to contact to report any reactions. These symptoms may be an emergency. Do not wait to see if the symptoms will go away. Get medical help right away. Call your local emergency services (911 in the [place] Do not drive yourself to the hospital. Summary Bruising and soreness at the IV site are common. Check your insertion site every day for signs of infection. Rest as told by your doctor. Go back to your normal activities as told by your doctor. Get help right away if you have signs of a serious reaction. This information is not intended to replace advice given to you by your health care provider. Make sure you discuss any questions you have with your health care provider. Document Revised: 01/20/2021 Document Reviewed: 03/20/2019 Elsevier Patient Education  2023 [employer]vier Inc.  

## 2022-03-13 NOTE — Patient Instructions (Signed)
Description   Spoke with pt's and instructed to take 1.5 tablets today and then continue taking 1 tablet daily except 1.5 tablets on Sundays and Wednesdays.  Recheck on Friday 03/17/22 with [place].  Call with Prednisone dosage changes ('15mg'$  daily)  Please keep green leafy veggies and boost in your diet.   Call with any new meds [phone] & if you have any procedures where you need to hold warfarin, have them fax a clearance form or labs to [phone].

## 2022-03-13 NOTE — Telephone Encounter (Signed)
CRITICAL VALUE STICKER  CRITICAL VALUE: Hbg 7.2 MG/dl  RECEIVER (on-site recipient of call): Hurshel Bouillon RN  [place] NOTIFIED:  03/13/22 1105  MESSENGER (representative from lab): [place]  MD NOTIFIED: Lonell Face  TIME OF NOTIFICATION: 11:09  RESPONSE:  Transfuse 1 unit PRBC's

## 2022-03-13 NOTE — Progress Notes (Signed)
Verbal order from Dr. Chryl Heck to transfuse one unit of PRBC's.

## 2022-03-14 LAB — TYPE AND SCREEN
ABO/RH(D): A POS
Antibody Screen: NEGATIVE
Unit division: 0

## 2022-03-14 LAB — BPAM RBC
Blood Product Expiration Date: 202306262359
ISSUE DATE / TIME: 202306051253
Unit Type and Rh: 6200

## 2022-03-17 ENCOUNTER — Telehealth: Payer: Self-pay | Admitting: *Deleted

## 2022-03-17 ENCOUNTER — Other Ambulatory Visit: Payer: Self-pay

## 2022-03-17 ENCOUNTER — Encounter: Payer: Self-pay | Admitting: Hematology and Oncology

## 2022-03-17 ENCOUNTER — Inpatient Hospital Stay (HOSPITAL_BASED_OUTPATIENT_CLINIC_OR_DEPARTMENT_OTHER): Payer: Medicare HMO | Admitting: Hematology and Oncology

## 2022-03-17 ENCOUNTER — Inpatient Hospital Stay: Payer: Medicare HMO

## 2022-03-17 ENCOUNTER — Other Ambulatory Visit: Payer: Self-pay | Admitting: *Deleted

## 2022-03-17 ENCOUNTER — Ambulatory Visit (INDEPENDENT_AMBULATORY_CARE_PROVIDER_SITE_OTHER): Payer: Medicare HMO

## 2022-03-17 ENCOUNTER — Other Ambulatory Visit: Payer: Self-pay | Admitting: Adult Health

## 2022-03-17 VITALS — BP 132/65 | HR 61 | Temp 98.2°F | Resp 16 | Ht 59.0 in | Wt 140.0 lb

## 2022-03-17 DIAGNOSIS — N184 Chronic kidney disease, stage 4 (severe): Secondary | ICD-10-CM | POA: Diagnosis not present

## 2022-03-17 DIAGNOSIS — D591 Autoimmune hemolytic anemia, unspecified: Secondary | ICD-10-CM

## 2022-03-17 DIAGNOSIS — E1122 Type 2 diabetes mellitus with diabetic chronic kidney disease: Secondary | ICD-10-CM | POA: Diagnosis not present

## 2022-03-17 DIAGNOSIS — D594 Other nonautoimmune hemolytic anemias: Secondary | ICD-10-CM

## 2022-03-17 DIAGNOSIS — M7989 Other specified soft tissue disorders: Secondary | ICD-10-CM | POA: Diagnosis not present

## 2022-03-17 DIAGNOSIS — Z794 Long term (current) use of insulin: Secondary | ICD-10-CM | POA: Diagnosis not present

## 2022-03-17 DIAGNOSIS — Z7901 Long term (current) use of anticoagulants: Secondary | ICD-10-CM | POA: Diagnosis not present

## 2022-03-17 DIAGNOSIS — E1165 Type 2 diabetes mellitus with hyperglycemia: Secondary | ICD-10-CM | POA: Diagnosis not present

## 2022-03-17 DIAGNOSIS — Z952 Presence of prosthetic heart valve: Secondary | ICD-10-CM | POA: Diagnosis not present

## 2022-03-17 DIAGNOSIS — D508 Other iron deficiency anemias: Secondary | ICD-10-CM

## 2022-03-17 DIAGNOSIS — M8580 Other specified disorders of bone density and structure, unspecified site: Secondary | ICD-10-CM | POA: Diagnosis not present

## 2022-03-17 DIAGNOSIS — I130 Hypertensive heart and chronic kidney disease with heart failure and stage 1 through stage 4 chronic kidney disease, or unspecified chronic kidney disease: Secondary | ICD-10-CM | POA: Diagnosis not present

## 2022-03-17 DIAGNOSIS — I7782 Antineutrophilic cytoplasmic antibody (ANCA) vasculitis: Secondary | ICD-10-CM | POA: Diagnosis not present

## 2022-03-17 DIAGNOSIS — D649 Anemia, unspecified: Secondary | ICD-10-CM

## 2022-03-17 LAB — CMP (CANCER CENTER ONLY)
ALT: 15 U/L (ref 0–44)
AST: 22 U/L (ref 15–41)
Albumin: 3.3 g/dL — ABNORMAL LOW (ref 3.5–5.0)
Alkaline Phosphatase: 53 U/L (ref 38–126)
Anion gap: 9 (ref 5–15)
BUN: 112 mg/dL — ABNORMAL HIGH (ref 8–23)
CO2: 20 mmol/L — ABNORMAL LOW (ref 22–32)
Calcium: 8.5 mg/dL — ABNORMAL LOW (ref 8.9–10.3)
Chloride: 103 mmol/L (ref 98–111)
Creatinine: 2.79 mg/dL — ABNORMAL HIGH (ref 0.44–1.00)
GFR, Estimated: 16 mL/min — ABNORMAL LOW (ref >60)
Glucose, Bld: 138 mg/dL — ABNORMAL HIGH (ref 70–99)
Potassium: 4.4 mmol/L (ref 3.5–5.1)
Sodium: 132 mmol/L — ABNORMAL LOW (ref 135–145)
Total Bilirubin: 0.5 mg/dL (ref 0.3–1.2)
Total Protein: 5 g/dL — ABNORMAL LOW (ref 6.5–8.1)

## 2022-03-17 LAB — GLUCOSE, CAPILLARY: Glucose-Capillary: 282 mg/dL — ABNORMAL HIGH (ref 70–99)

## 2022-03-17 LAB — CBC WITH DIFFERENTIAL/PLATELET
Abs Immature Granulocytes: 0.08 10*3/uL — ABNORMAL HIGH (ref 0.00–0.07)
Basophils Absolute: 0 10*3/uL (ref 0.0–0.1)
Basophils Relative: 1 %
Eosinophils Absolute: 0 10*3/uL (ref 0.0–0.5)
Eosinophils Relative: 3 %
HCT: 21.7 % — ABNORMAL LOW (ref 36.0–46.0)
Hemoglobin: 7.3 g/dL — ABNORMAL LOW (ref 12.0–15.0)
Immature Granulocytes: 5 %
Lymphocytes Relative: 17 %
Lymphs Abs: 0.3 10*3/uL — ABNORMAL LOW (ref 0.7–4.0)
MCH: 32.7 pg (ref 26.0–34.0)
MCHC: 33.6 g/dL (ref 30.0–36.0)
MCV: 97.3 fL (ref 80.0–100.0)
Monocytes Absolute: 0.2 10*3/uL (ref 0.1–1.0)
Monocytes Relative: 15 %
Neutro Abs: 0.9 10*3/uL — ABNORMAL LOW (ref 1.7–7.7)
Neutrophils Relative %: 59 %
Platelets: 93 10*3/uL — ABNORMAL LOW (ref 150–400)
RBC: 2.23 MIL/uL — ABNORMAL LOW (ref 3.87–5.11)
RDW: 16.8 % — ABNORMAL HIGH (ref 11.5–15.5)
Smear Review: NORMAL
WBC: 1.5 10*3/uL — ABNORMAL LOW (ref 4.0–10.5)
nRBC: 0 % (ref 0.0–0.2)

## 2022-03-17 LAB — SAMPLE TO BLOOD BANK

## 2022-03-17 LAB — RETIC PANEL
Immature Retic Fract: 9.7 % (ref 2.3–15.9)
RBC.: 2.2 MIL/uL — ABNORMAL LOW (ref 3.87–5.11)
Retic Count, Absolute: 139.3 10*3/uL (ref 19.0–186.0)
Retic Ct Pct: 6.3 % — ABNORMAL HIGH (ref 0.4–3.1)
Reticulocyte Hemoglobin: 34.1 pg (ref >27.9)

## 2022-03-17 LAB — PROTIME-INR
INR: 2.7 — ABNORMAL HIGH (ref 0.8–1.2)
Prothrombin Time: 28 seconds — ABNORMAL HIGH (ref 11.4–15.2)

## 2022-03-17 LAB — PREPARE RBC (CROSSMATCH)

## 2022-03-17 MED ORDER — INSULIN ASPART 100 UNIT/ML IJ SOLN: 4.0000 [IU] | Freq: Once | INTRAMUSCULAR | Status: DC

## 2022-03-17 MED ORDER — SODIUM CHLORIDE 0.9% IV SOLUTION
250.0000 mL | Freq: Once | INTRAVENOUS | Status: AC
  Administered 2022-03-17: 250 mL via INTRAVENOUS

## 2022-03-17 MED ORDER — INSULIN ASPART 100 UNIT/ML IJ SOLN
4.0000 [IU] | Freq: Once | INTRAMUSCULAR | Status: AC
  Administered 2022-03-17: 4 [IU] via SUBCUTANEOUS
  Filled 2022-03-17: qty 1

## 2022-03-17 MED ORDER — DIPHENHYDRAMINE HCL 25 MG PO CAPS
25.0000 mg | ORAL_CAPSULE | Freq: Once | ORAL | Status: AC
  Administered 2022-03-17: 25 mg via ORAL
  Filled 2022-03-17: qty 1

## 2022-03-17 MED ORDER — ACETAMINOPHEN 325 MG PO TABS
650.0000 mg | ORAL_TABLET | Freq: Once | ORAL | Status: AC
  Administered 2022-03-17: 650 mg via ORAL
  Filled 2022-03-17: qty 2

## 2022-03-17 NOTE — Patient Instructions (Signed)
Blood Transfusion, Adult, Care After This sheet gives you information about how to care for yourself after your procedure. Your doctor may also give you more specific instructions. If you have problems or questions, contact your doctor. What can I expect after the procedure? After the procedure, it is common to have: Bruising and soreness at the IV site. A headache. Follow these instructions at home: Insertion site care     Follow instructions from your doctor about how to take care of your insertion site. This is where an IV tube was put into your vein. Make sure you: Wash your hands with soap and water before and after you change your bandage (dressing). If you cannot use soap and water, use hand sanitizer. Change your bandage as told by your doctor. Check your insertion site every day for signs of infection. Check for: Redness, swelling, or pain. Bleeding from the site. Warmth. Pus or a bad smell. General instructions Take over-the-counter and prescription medicines only as told by your doctor. Rest as told by your doctor. Go back to your normal activities as told by your doctor. Keep all follow-up visits as told by your doctor. This is important. Contact a doctor if: You have itching or red, swollen areas of skin (hives). You feel worried or nervous (anxious). You feel weak after doing your normal activities. You have redness, swelling, warmth, or pain around the insertion site. You have blood coming from the insertion site, and the blood does not stop with pressure. You have pus or a bad smell coming from the insertion site. Get help right away if: You have signs of a serious reaction. This may be coming from an allergy or the body's defense system (immune system). Signs include: Trouble breathing or shortness of breath. Swelling of the face or feeling warm (flushed). Fever or chills. Head, chest, or back pain. Dark pee (urine) or blood in the pee. Widespread rash. Fast  heartbeat. Feeling dizzy or light-headed. You may receive your blood transfusion in an outpatient setting. If so, you will be told whom to contact to report any reactions. These symptoms may be an emergency. Do not wait to see if the symptoms will go away. Get medical help right away. Call your local emergency services (911 in the [place] Do not drive yourself to the hospital. Summary Bruising and soreness at the IV site are common. Check your insertion site every day for signs of infection. Rest as told by your doctor. Go back to your normal activities as told by your doctor. Get help right away if you have signs of a serious reaction. This information is not intended to replace advice given to you by your health care provider. Make sure you discuss any questions you have with your health care provider. Document Revised: 01/20/2021 Document Reviewed: 03/20/2019 Elsevier Patient Education  2023 [employer]vier Inc.  

## 2022-03-17 NOTE — Progress Notes (Signed)
[facility] Follow up:    [name], [name], MD [place] [address]  DIAGNOSIS:   SUMMARY OF HEMATOLOGIC HISTORY: 81 y.o. [place] woman with multifactorial anemia, as follows:             (a) anemia of chronic inflammation: As of May 2020 she has a SED rate of 95, CRP 1.1, positive ANA and RF; subsequently she was found to be ANCA positive             (b) hemolysis (mild): she has a mildly elevated LDH and a DAT positive for complement, not IgG; this is c/w (a) above             (c) anemia of renal insufficiency: inadequate EPO resonse to anemia                         (d) GIB/ iron deficiency in patient on lifelong anticoagulaton   (1) Feraheme received 03/01/2019, repeated 03/20/2019              (a) reticulocyte 03/07/2019 up from 43.4 a year ago to 191.8 after iron infusion             (b) feraheme repeated on 05/12/2019             (c) subsequent ferritin levels >100   (2) darbepoetin: starting 05/05/2019 at 177mg given every 2 weeks             (a) dose increased to 200 mcg every week starting 07/01/2019              (b) back to every 2 weeks beginning 08/04/2019 still at 200 mcg dose             (c) switched to Retacrit every 4 weeks as of 08/18/2019   (3) ANCA positive vasculitis: diagnosed 05/08/2019 (titer 1:640)             (a) prednisone 40 mg daily started 05/31/2019             (b) rituximab weekly started 06/12/2019 (received test dose 06/05/2019)                         (i) hepatitis B sAg, core Ab and HIV negative 05/08/2019                         (ii) rituximab held after 07/14/2019 dose (received 5 weekly doses) with neutropenia                         (iii) rituximab resumed 08/18/2019 but changed to monthly                         (iv) rituximab changed to every 2 months beginning with 02/02/2020 dose                         (v) rituximab changed to every 12 weeks after the 10/12/2020 dose             (c) prednisone  tapered to off as of 09/15/2019   (4) osteopenia with a T score of -1.5 on bone density 02/28/2016 at the [facility] center  CURRENT THERAPY: Prednisone and MMF  INTERVAL HISTORY:  [name] DONNER89y.o. female returns for her follow-up  of anemia.  [name] is here for follow-up.  Given history of ANCA positive autoimmune hemolytic anemia, she received 8 weekly infusions of rituximab, currently continues on prednisone.  She relapsed twice when we tried to taper her off of prednisone. She started hemolyzing again when we started tapering of the prednisone below 20 mg. She says she feels quite well today.  Bilateral lower extremity swelling significantly better.  Her blood glucose is well controlled.  She denies any bleeding.  Rest of the pertinent 10 point ROS reviewed and negative   Patient Active Problem List   Diagnosis Date Noted   Hyperkalemia 02/04/2022   Acute renal failure superimposed on stage 3b chronic kidney disease ([place]) 02/04/2022   Hyperglycemia 01/09/2022   Autoimmune hemolytic anemia (HCC) 01/02/2022   Bilateral lower extremity edema 01/02/2022   Refractory anemia (HCC) 12/15/2021   Abnormal chest x-ray 12/15/2021   Supratherapeutic INR 12/15/2021   Elevated troponin 12/15/2021   Unilateral primary osteoarthritis, right hip 05/31/2021   Midline cystocele 05/30/2021   Primary localized osteoarthritis of pelvic region and thigh 05/30/2021   Vaginal pain 05/30/2021   Asymmetric SNHL (sensorineural hearing loss) 01/06/2021   Referred otalgia of both ears 01/06/2021   Bilateral hearing loss 08/31/2020   Low back pain 02/20/2020   CKD (chronic kidney disease), stage IV ([place]) 10/13/2019   Leukopenia due to antineoplastic chemotherapy ([place]) 08/18/2019   Thrombophilia ([place]) 06/19/2019   Encounter for general adult medical examination without abnormal findings 06/13/2019   Arteritis ([place]) 06/06/2019   Hypertensive heart and chronic kidney disease with heart failure and stage  1 through stage 4 chronic kidney disease, or unspecified chronic kidney disease ([place]) 06/06/2019   Muscle weakness 06/06/2019   Pancytopenia ([place]) 05/28/2019   Dyspnea 05/28/2019   Vasculitis, ANCA positive 05/28/2019   Disorder of connective tissue ([place]) 05/14/2019   Renal insufficiency 05/08/2019   Acute on chronic diastolic CHF (congestive heart failure) ([place]) 04/07/2019   Left leg cellulitis 04/07/2019   PAF (paroxysmal atrial fibrillation) ([place]) 04/07/2019   Iron deficiency anemia 02/28/2019   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 ([place]) 02/28/2019   Diabetic retinopathy associated with type 2 diabetes mellitus ([place]) 09/02/2018   Chronic pain 06/19/2018   Non-thrombocytopenic purpura ([place]) 05/21/2018   Gout 03/22/2018   Malnutrition of moderate degree 03/12/2018   Diabetes mellitus type 2, controlled ([place]) 03/06/2018   S/P cholecystectomy 03/06/2018   HTN (hypertension) 03/06/2018   H/O mechanical aortic valve replacement 03/06/2018   Moderate to severe pulmonary hypertension ([place]) 03/06/2018   GERD (gastroesophageal reflux disease) 02/21/2018   Anxiety 02/21/2018   Chronic diastolic CHF (congestive heart failure) ([place]) 02/21/2018   Diarrhea 02/21/2018   General weakness 02/02/2018   Thrombocytopenia ([place]) 01/25/2018   Arm pain, diffuse 01/25/2018   Anemia 01/23/2018   Aortic atherosclerosis ([place]) 01/23/2018   GI bleed 01/18/2018   Age-related osteoporosis without current pathological fracture 05/24/2017   Carotid artery occlusion 05/24/2017   Generalized anxiety disorder 05/24/2017   Hearing loss 05/24/2017   Presence of prosthetic heart valve 05/24/2017   Carotid artery disease ([place]) 08/18/2013   Peripheral arterial disease ([place]) 08/18/2013   Diabetes mellitus without complication ([place]) 04/03/9484   Hyponatremia 10/06/2011   Hypertension    Hypercholesteremia    Mechanical heart valve present    Chronic anticoagulation     is allergic to  flagyl [metronidazole], ciprofloxacin, coreg [carvedilol], diflucan [fluconazole], losartan, privigen [immune globulin (human)], sulfamethoxazole-trimethoprim, verapamil, zetia [ezetimibe], zocor [simvastatin - high dose], and gatifloxacin.  MEDICAL HISTORY: Past Medical History:  Diagnosis Date   Aortic atherosclerosis ([place]) 01/23/2018   Atrial fibrillation (HCC)    Benign positional vertigo 10/06/2011   Carotid artery disease ([place])    Chemotherapy induced neutropenia ([place]) 07/21/2019   Cholelithiasis 01/23/2018   Cholelithiasis 01/23/2018   Chronic anticoagulation    Coronary artery disease    status post coronary artery bypass grafting times 07/10/2004   Diabetes mellitus    Hypercholesteremia    Hypertension    Mechanical heart valve present    H. aortic valve replacement at the time of bypass surgery October 2005   Moderate to severe pulmonary hypertension ([place])    Peripheral arterial disease ([place])    history of left common iliac artery PTA and stenting for a chronic total occlusion 08/26/01    SURGICAL HISTORY: Past Surgical History:  Procedure Laterality Date   anterior repair  2009   AORTIC VALVE REPLACEMENT  2005   Dr. Cyndia Bent   CARDIAC CATHETERIZATION  11/10/2004   40% right common illiac, 70% in stent restenosis of distal left common illiac,    CARDIAC CATHETERIZATION  05/18/2004   LAD 50-70% midstenosis, RCA dominant w/50% stenosis, 50% Right common Illiac artery ostial stenosis, 90% in stent restenosis within midportion of left common illiac stent   Carotid Duplex  03/12/2012   RSA-elev. velocities suggestive of a 50-69% diameter reduction, Right&Left Bulb/Prox ICA-mild-mod.fibrous plaqueelevating Velocities abnormal study.   CHOLECYSTECTOMY N/A 03/01/2018   Procedure: LAPAROSCOPIC CHOLECYSTECTOMY;  Surgeon: Judeth Horn, MD;  Location: [facility];  Service: General;  Laterality: N/A;   COLONOSCOPY WITH PROPOFOL N/A 01/22/2018   Procedure: COLONOSCOPY WITH PROPOFOL;   Surgeon: Wilford Corner, MD;  Location: [facility];  Service: Endoscopy;  Laterality: N/A;   ESOPHAGOGASTRODUODENOSCOPY (EGD) WITH PROPOFOL N/A 01/22/2018   Procedure: ESOPHAGOGASTRODUODENOSCOPY (EGD) WITH PROPOFOL;  Surgeon: Wilford Corner, MD;  Location: [name];  Service: Endoscopy;  Laterality: N/A;   ESOPHAGOGASTRODUODENOSCOPY (EGD) WITH PROPOFOL N/A 11/21/2018   Procedure: ESOPHAGOGASTRODUODENOSCOPY (EGD) WITH PROPOFOL;  Surgeon: [name], MD;  Location: [facility];  Service: Gastroenterology;  Laterality: N/A;   ESOPHAGOGASTRODUODENOSCOPY (EGD) WITH PROPOFOL Left 02/27/2019   Procedure: ESOPHAGOGASTRODUODENOSCOPY (EGD) WITH PROPOFOL;  Surgeon: Arta Silence, MD;  Location: [facility];  Service: Endoscopy;  Laterality: Left;   GIVENS CAPSULE STUDY N/A 11/21/2018   Procedure: GIVENS CAPSULE STUDY;  Surgeon: [name], MD;  Location: [facility];  Service: Gastroenterology;  Laterality: N/A;  To be deployed during EGD   Lower Ext. Duplex  03/12/2012   Right Proximal CIA- vessel narrowing w/elevated velocities 0-49% diameter reduction. Right SFA-mild mixed density plaque throughout vessel.   NM MYOCAR PERF WALL MOTION  05/19/2010   protocol: Persantine, post stress EF 65%, negative for ischemia, low risk scan   RIGHT HEART CATH N/A 06/27/2018   Procedure: RIGHT HEART CATH;  Surgeon: [name], MD;  Location: [facility];  Service: Cardiovascular;  Laterality: N/A;   TOTAL ABDOMINAL HYSTERECTOMY W/ BILATERAL SALPINGOOPHORECTOMY  1989   TRANSTHORACIC ECHOCARDIOGRAM  08/29/2012   Moderately calcified annulus of mitral valve, moderate regurg. of both mitral valve and tricuspid valve.     SOCIAL HISTORY: Social History   Socioeconomic History   Marital status: Widowed    Spouse name: Not on file   Number of children: Not on file   Years of education: Not on file   Highest education level: Not on file  Occupational History   Occupation: Retired  Tobacco  Use   Smoking status: Former  Packs/day: 1.00    Years: 30.00    Total pack years: 30.00    Types: Cigarettes   Smokeless tobacco: Never   Tobacco comments:    quit smoking 2005  Vaping Use   Vaping Use: Never used  Substance and Sexual Activity   Alcohol use: No    Alcohol/week: 0.0 standard drinks of alcohol   Drug use: No   Sexual activity: Not Currently    Birth control/protection: None  Other Topics Concern   Not on file  Social History Narrative   Not on file   Social Determinants of Health   Financial Resource Strain: Low Risk  (02/28/2022)   Overall Financial Resource Strain (CARDIA)    Difficulty of Paying Living Expenses: Not hard at all  Food Insecurity: No Food Insecurity (01/13/2022)   Hunger Vital Sign    Worried About Running Out of Food in the Last Year: Never true    Ran Out of Food in the Last Year: Never true  Transportation Needs: No Transportation Needs (07/13/2021)   PRAPARE - Hydrologist (Medical): No    Lack of Transportation (Non-Medical): No  Physical Activity: Inactive (02/28/2022)   Exercise Vital Sign    Days of Exercise per Week: 0 days    Minutes of Exercise per Session: 0 min  Stress: No Stress Concern Present (02/28/2022)   [place]    Feeling of Stress : Only a little  Social Connections: Socially Isolated (11/25/2019)   Social Connection and Isolation Panel [NHANES]    Frequency of Communication with Friends and Family: More than three times a week    Frequency of Social Gatherings with Friends and Family: Once a week    Attends Religious Services: Never    Marine scientist or Organizations: No    Attends Archivist Meetings: Never    Marital Status: Widowed  Intimate Partner Violence: Not At Risk (02/28/2022)   Humiliation, Afraid, Rape, and Kick questionnaire    Fear of Current or Ex-Partner: No    Emotionally Abused: No     Physically Abused: No    Sexually Abused: No    FAMILY HISTORY: Family History  Problem Relation Age of Onset   Heart disease Father    Hypertension Father    Hyperlipidemia Father    Breast cancer Neg Hx     Review of Systems  Constitutional:  Positive for fatigue. Negative for appetite change, chills, fever and unexpected weight change.  HENT:   Negative for hearing loss, lump/mass and trouble swallowing.   Eyes:  Negative for eye problems and icterus.  Respiratory:  Positive for shortness of breath. Negative for chest tightness and cough.   Cardiovascular:  Positive for leg swelling. Negative for chest pain and palpitations.  Gastrointestinal:  Negative for abdominal distention, abdominal pain, constipation, diarrhea, nausea and vomiting.  Endocrine: Negative for hot flashes.  Genitourinary:  Negative for difficulty urinating.   Musculoskeletal:  Negative for arthralgias.  Skin:  Negative for itching and rash.  Neurological:  Negative for dizziness, extremity weakness, headaches and numbness.  Hematological:  Negative for adenopathy. Does not bruise/bleed easily.  Psychiatric/Behavioral:  Negative for depression. The patient is not nervous/anxious.       PHYSICAL EXAMINATION  ECOG PERFORMANCE STATUS: 1 - Symptomatic but completely ambulatory  Vitals:   03/17/22 0953  BP: 132/65  Pulse: 61  Resp: 16  Temp: 98.2 F (36.8 C)  SpO2:  100%    Physical Exam Constitutional:      General: She is not in acute distress.    Appearance: Normal appearance. She is not toxic-appearing.     Comments: She is in a wheelchair  HENT:     Head: Normocephalic and atraumatic.  Eyes:     General: No scleral icterus. Cardiovascular:     Rate and Rhythm: Normal rate and regular rhythm.     Pulses: Normal pulses.     Heart sounds: Normal heart sounds.  Pulmonary:     Effort: Pulmonary effort is normal.     Breath sounds: Normal breath sounds.  Abdominal:     General: Abdomen  is flat. Bowel sounds are normal. There is no distension.     Palpations: Abdomen is soft.     Tenderness: There is no abdominal tenderness. There is no guarding.  Musculoskeletal:        General: Swelling (She has her legs wrapped in Ace bandages today.  Bilateral lower extremity swelling improving) present.     Cervical back: Neck supple.  Lymphadenopathy:     Cervical: No cervical adenopathy.  Skin:    General: Skin is warm and dry.     Findings: Bruising present. No rash.  Neurological:     General: No focal deficit present.     Mental Status: She is alert.  Psychiatric:        Mood and Affect: Mood normal.        Behavior: Behavior normal.     LABORATORY DATA:  CBC    Component Value Date/Time   WBC 1.5 (L) 03/17/2022 0923   RBC 2.20 (L) 03/17/2022 0923   RBC 2.23 (L) 03/17/2022 0923   HGB 7.3 (L) 03/17/2022 0923   HGB 9.1 (L) 03/07/2022 1321   HCT 21.7 (L) 03/17/2022 0923   HCT 28.5 (L) 11/20/2018 1824   PLT 93 (L) 03/17/2022 0923   PLT 117 (L) 03/07/2022 1321   MCV 97.3 03/17/2022 0923   MCH 32.7 03/17/2022 0923   MCHC 33.6 03/17/2022 0923   RDW 16.8 (H) 03/17/2022 0923   LYMPHSABS 0.3 (L) 03/17/2022 0923   MONOABS 0.2 03/17/2022 0923   EOSABS 0.0 03/17/2022 0923   BASOSABS 0.0 03/17/2022 0923    CMP     Component Value Date/Time   NA 132 (L) 03/17/2022 0923   NA 137 12/06/2017 1436   K 4.4 03/17/2022 0923   CL 103 03/17/2022 0923   CO2 20 (L) 03/17/2022 0923   GLUCOSE 138 (H) 03/17/2022 0923   BUN 112 (H) 03/17/2022 0923   BUN 20 12/06/2017 1436   CREATININE 2.79 (H) 03/17/2022 0923   CALCIUM 8.5 (L) 03/17/2022 0923   PROT 5.0 (L) 03/17/2022 0923   ALBUMIN 3.3 (L) 03/17/2022 0923   AST 22 03/17/2022 0923   ALT 15 03/17/2022 0923   ALKPHOS 53 03/17/2022 0923   BILITOT 0.5 03/17/2022 0923   GFRNONAA 16 (L) 03/17/2022 0923   GFRAA 32 (L) 03/30/2020 0932     ASSESSMENT and THERAPY PLAN:   This is a very pleasant 81 year old female patient with  multifactorial anemia, previously treated with rituximab for ANCA positive hemolytic anemia, previously on maintenance rituximab every 12 weeks presented with worsening anemia thought to be related to her hemolysis.   She was diagnosed with elevated ANCA titers 1 is to 640 and previously treated for ANCA positive hemolytic anemia, seen by rheumatology Dr. Salome Holmes back in 2020, responded at that time to prednisone and  rituximab and continued on rituximab maintenance.  However her rituximab was changed to every 61-monthinfusions and she relapsed before her second cycle of every 327-monthituximab.    She received 8 weekly cycles of rituximab so far.  She relapsed again while she was on prednisone 10 mg daily.  Hence we have uptitrated the prednisone to 15 mg but hemolysis continues to worsen.  Hence have asked her to go back on 20 mg p.o. daily.  She will work on using insulin as instructed for management of hyperglycemia.  We have also increased mycophenolate mofetil to 1000 mg p.o. daily.  She denies any adverse effects from the medication.  We have also recommended that she seek a second opinion for management of hemolytic anemia.  She is agreeable to this.  We will follow-up on the referral status again.  We will plan to transfuse her 2 units of packed RBC today, twice weekly labs and standing infusion appointments.  Return to clinic next week with APP and follow-up with me in 2 weeks. No findings on bone marrow to explain this. CT imaging without any clear explanation ANCA titres are normal.  She agrees to second opinion  Total encounter time: 3085mtes in face-to-face visit time, chart review, lab review, care coordination, discussion with other providers, discussion with nursing, patient education, and documentation of the encounter.

## 2022-03-17 NOTE — Telephone Encounter (Signed)
This RN contacted [facility] per need for urgent referral for consult due to progressive hemolytic anemia at [phone]- per discussion with intake coordinator - request for appt faxed to [phone] with request to mark as urgent.  Above sent with confirm receipt as received.

## 2022-03-17 NOTE — Patient Outreach (Signed)
[facility]) Care Management  03/17/2022  PEYTON ROSSNER 20-Mar-1941 [id number]   Telephone call to patient for follow up.  No answer.  HIPAA compliant voice message left.  Plan: RN CM will attempt patient again within 4 business days.   [name], RN, MSN [facility] Care Management Care Management Coordinator Direct Line [phone] Toll Free: [phone]  Fax: [phone]

## 2022-03-17 NOTE — Patient Instructions (Signed)
Description   Spoke with pt and instructed to continue taking 1 tablet daily except 1.5 tablets on Sundays and Wednesdays.  Recheck INR in 1 week at [facility].  Call with Prednisone dosage changes ('20mg'$  daily)  Please keep green leafy veggies and boost in your diet.   Call with any new meds [phone] & if you have any procedures where you need to hold warfarin, have them fax a clearance form or labs to [phone].

## 2022-03-18 LAB — BPAM RBC
Blood Product Expiration Date: 202307042359
Blood Product Expiration Date: 202307042359
ISSUE DATE / TIME: 202306091249
ISSUE DATE / TIME: 202306091252
Unit Type and Rh: 6200
Unit Type and Rh: 6200

## 2022-03-18 LAB — TYPE AND SCREEN
ABO/RH(D): A POS
Antibody Screen: NEGATIVE
Unit division: 0
Unit division: 0

## 2022-03-20 ENCOUNTER — Other Ambulatory Visit: Payer: Self-pay

## 2022-03-20 ENCOUNTER — Telehealth: Payer: Self-pay | Admitting: Hematology and Oncology

## 2022-03-20 NOTE — Patient Instructions (Addendum)
Patient Goals/Self-Care Activities: Heart Failure and Diabetes Take all medications as prescribed Attend all scheduled provider appointments call office if I gain more than 2 pounds in one day or 5 pounds in one week track weight in diary use salt in moderation watch for swelling in feet, ankles and legs every day weigh myself daily follow rescue plan if symptoms flare-up check blood sugar at prescribed times: twice daily enter blood sugar readings and medication or insulin into daily log take the blood sugar log to all doctor visits manage portion size Limit food high in carbohydrates such as rice, bread, potatoes, pasta  Patient Goals/Self-Care Activities: Anemia Take all medications as prescribed Attend all scheduled provider appointments Notify physician for any changes in condition such as shortness of breath and increased fatigue.   Pace self with activities Eat iron rich foods such as red meat, salmon, breakfast cereal with iron.   

## 2022-03-20 NOTE — Patient Outreach (Signed)
[facility]) Care Management  03/20/2022  [name] Jun 30, 1941 [id number]   Telephone call to patient. She reports some nausea this am but states it comes and goes.  Patient feeling some better after transfusions on Friday. She goes for blood work on 03/21/22.  Discussed Diabetes and heart failure management as well as anemia.  No concerns.    Care Plan : RN CM Plan of Care  Updates made by Jon Billings, RN since 03/20/2022 12:00 AM     Problem: Chronic disease management and care coordination needs of HF, DM, and Anemia   Priority: High     Long-Range Goal: Development of plan of care for management of HF and DM   Start Date: 01/13/2022  Expected End Date: 10/08/2022  This Visit's Progress: On track  Recent Progress: On track  Priority: High  Note:   Current Barriers:  Chronic Disease Management support and education needs related to CHF and DMII   RNCM Clinical Goal(s):  Patient will verbalize basic understanding of  CHF and DMII disease process and self health management plan as evidenced by No acute exacerbation of heart failure and blood sugars less than 300 continue to work with [facility] to address care management and care coordination needs related to  CHF and DMII as evidenced by adherence to CM Team Scheduled appointments through collaboration with RN Care manager, provider, and care team.   Interventions: Education and support related to heart failure and diabetes Inter-disciplinary care team collaboration (see longitudinal plan of care) Evaluation of current treatment plan related to  self management and patient's adherence to plan as established by provider   Heart Failure Interventions:  (Status:  Goal on track:  Yes.) Long Term Goal Basic overview and discussion of pathophysiology of Heart Failure reviewed Provided education on low sodium diet Discussed importance of daily weight and advised patient to weigh and record daily  Diabetes  Interventions:  (Status:  Goal on track:  Yes.) Long Term Goal Assessed patient's understanding of A1c goal: <7% Provided education to patient about basic DM disease process Reviewed medications with patient and discussed importance of medication adherence Discussed plans with patient for ongoing care management follow up and provided patient with direct contact information for care management team Discussed importance of monitoring carbohydrates and sugars.  Lab Results  Component Value Date   HGBA1C 4.6 (L) 02/24/2019  01/13/22 Patient having some problems with her anemia.  She is on prednisone and has had a blood transfusion.  Patient reports she is now on tresiba for her sugars 14 units daily.  Patient reports blood sugar however this morning was 253.  She is concerned with highs and will be calling physician to reports sugars and next steps.   Discussed importance of tight carbohydrate control to help with her sugars.  Weight up to 131 lbs. However, due to patient on prednisone explains weight gain.  CM reiterated signs of heart failure.    01/31/22 Patient continues to deal with her anemia.  However, prednisone has started to be tapered. Patient working with pharmacist to adjust [name] accordingly.  Patient reports highest sugar was 369 in the last 7-8 days.  Discussed affects of prednisone on sugars.  Patient weight today 127 lbs.  Patient continues with heart failure management such as no salt, daily weights, and watching for swelling.    02/13/22 Patient continues with diabetes management.  Blood sugar continues to be up and down due to prednisone.  Discussed tight carbohydrate  control.  Patient weight up since hospitalization due to blood transfusion and IV fluids.  Most recent weight 138 lbs.  Patient reports some swelling to legs.  Encouraged to limit salt intake and continue daily weights.    02/28/22 Patient continues on prednisone causing her sugars to be irradict.  Patient on sliding scale  now.  Discussed importance of continued diabetes management.  Patient weight at 137 lbs.  Heart failure management continues.    03/20/22 Patient blood sugars continue to be up and down due to prednisone use.  Patient blood sugar this am.  Patient weight 138 lbs.    Patient Goals/Self-Care Activities: Heart Failure and Diabetes Take all medications as prescribed Attend all scheduled provider appointments call office if I gain more than 2 pounds in one day or 5 pounds in one week track weight in diary use salt in moderation watch for swelling in feet, ankles and legs every day weigh myself daily follow rescue plan if symptoms flare-up check blood sugar at prescribed times: twice daily enter blood sugar readings and medication or insulin into daily log take the blood sugar log to all doctor visits manage portion size Limit food high in carbohydrates such as rice, bread, potatoes, pasta  Follow Up Plan:  Telephone follow up appointment with care management team member scheduled for:  again in June The patient has been provided with contact information for the care management team and has been advised to call with any health related questions or concerns.      Long-Range Goal: Development of plan of care for management of anemia   Start Date: 02/13/2022  Expected End Date: 10/08/2022  This Visit's Progress: On track  Recent Progress: On track  Priority: High  Note:   Current Barriers:  Chronic Disease Management support and education needs related to Anemia   RNCM Clinical Goal(s):  Patient will verbalize basic understanding of  Anemia disease process and self health management plan as evidenced by hemoglobin staying above 7.0  through collaboration with RN Care manager, provider, and care team.   Interventions: Education and support related to anemia Inter-disciplinary care team collaboration (see longitudinal plan of care) Evaluation of current treatment plan related to  self  management and patient's adherence to plan as established by provider   Anemia  (Status:  Goal on track:  Yes.)  Long Term Goal Evaluation of current treatment plan related to  Anemia ,  self-management and patient's adherence to plan as established by provider. Discussed plans with patient for ongoing care management follow up and provided patient with direct contact information for care management team Evaluation of current treatment plan related to anemia and patient's adherence to plan as established by provider Provided education to patient re: Iron rich foods and importance of follow up with physician Discussed plans with patient for ongoing care management follow up and provided patient with direct contact information for care management team  Patient Goals/Self-Care Activities: Anemia Take all medications as prescribed Attend all scheduled provider appointments Notify physician for any changes in condition such as shortness of breath and increased fatigue.   Pace self with activities Eat iron rich foods such as red meat, salmon, breakfast cereal with iron.    02/28/22 Patient reports doing very well.  Pleased with blood work on yesterday hemoglobin 9.2 and no transfusion needed.  Encouraged to eat protein and iron rich foods as she can.  Remains on prednisone but 15 mg this week per patient.  No concerns.  03/20/22 Patient continues to battle with anemia. She had a blood transfusion on Friday.  Patient reports feeling some better after.  Patient remains on prednisone at 20 mg.  Reiterated importance of iron rich foods.     Follow Up Plan:  Telephone follow up appointment with care management team member scheduled for:  again in June The patient has been provided with contact information for the care management team and has been advised to call with any health related questions or concerns.      Plan: Follow-up: Patient agrees to Care Plan and Follow-up. Follow-up again in June.     [name], RN, MSN [facility] Care Management Care Management Coordinator Direct Line [phone] Toll Free: [phone]  Fax: [phone]

## 2022-03-20 NOTE — Telephone Encounter (Signed)
Scheduled appointment per 06/09 los. Patient aware.

## 2022-03-21 ENCOUNTER — Inpatient Hospital Stay: Payer: Medicare HMO

## 2022-03-21 ENCOUNTER — Other Ambulatory Visit: Payer: Self-pay | Admitting: Lab

## 2022-03-21 ENCOUNTER — Other Ambulatory Visit: Payer: Self-pay

## 2022-03-21 DIAGNOSIS — D591 Autoimmune hemolytic anemia, unspecified: Secondary | ICD-10-CM | POA: Diagnosis not present

## 2022-03-21 DIAGNOSIS — D594 Other nonautoimmune hemolytic anemias: Secondary | ICD-10-CM

## 2022-03-21 DIAGNOSIS — M8580 Other specified disorders of bone density and structure, unspecified site: Secondary | ICD-10-CM | POA: Diagnosis not present

## 2022-03-21 DIAGNOSIS — N184 Chronic kidney disease, stage 4 (severe): Secondary | ICD-10-CM | POA: Diagnosis not present

## 2022-03-21 DIAGNOSIS — E1122 Type 2 diabetes mellitus with diabetic chronic kidney disease: Secondary | ICD-10-CM | POA: Diagnosis not present

## 2022-03-21 DIAGNOSIS — I7782 Antineutrophilic cytoplasmic antibody (ANCA) vasculitis: Secondary | ICD-10-CM | POA: Diagnosis not present

## 2022-03-21 DIAGNOSIS — E1165 Type 2 diabetes mellitus with hyperglycemia: Secondary | ICD-10-CM | POA: Diagnosis not present

## 2022-03-21 DIAGNOSIS — I130 Hypertensive heart and chronic kidney disease with heart failure and stage 1 through stage 4 chronic kidney disease, or unspecified chronic kidney disease: Secondary | ICD-10-CM | POA: Diagnosis not present

## 2022-03-21 DIAGNOSIS — Z794 Long term (current) use of insulin: Secondary | ICD-10-CM | POA: Diagnosis not present

## 2022-03-21 DIAGNOSIS — M7989 Other specified soft tissue disorders: Secondary | ICD-10-CM | POA: Diagnosis not present

## 2022-03-21 LAB — CBC WITH DIFFERENTIAL/PLATELET
Abs Immature Granulocytes: 0 10*3/uL (ref 0.00–0.07)
Basophils Absolute: 0 10*3/uL (ref 0.0–0.1)
Basophils Relative: 1 %
Eosinophils Absolute: 0 10*3/uL (ref 0.0–0.5)
Eosinophils Relative: 1 %
HCT: 32.5 % — ABNORMAL LOW (ref 36.0–46.0)
Hemoglobin: 10.9 g/dL — ABNORMAL LOW (ref 12.0–15.0)
Immature Granulocytes: 0 %
Lymphocytes Relative: 26 %
Lymphs Abs: 0.5 10*3/uL — ABNORMAL LOW (ref 0.7–4.0)
MCH: 31.7 pg (ref 26.0–34.0)
MCHC: 33.5 g/dL (ref 30.0–36.0)
MCV: 94.5 fL (ref 80.0–100.0)
Monocytes Absolute: 0.2 10*3/uL (ref 0.1–1.0)
Monocytes Relative: 9 %
Neutro Abs: 1.2 10*3/uL — ABNORMAL LOW (ref 1.7–7.7)
Neutrophils Relative %: 63 %
Platelets: 116 10*3/uL — ABNORMAL LOW (ref 150–400)
RBC: 3.44 MIL/uL — ABNORMAL LOW (ref 3.87–5.11)
RDW: 15.6 % — ABNORMAL HIGH (ref 11.5–15.5)
WBC: 1.9 10*3/uL — ABNORMAL LOW (ref 4.0–10.5)
nRBC: 0 % (ref 0.0–0.2)

## 2022-03-21 LAB — RETIC PANEL
Immature Retic Fract: 2.1 % — ABNORMAL LOW (ref 2.3–15.9)
RBC.: 3.44 MIL/uL — ABNORMAL LOW (ref 3.87–5.11)
Retic Count, Absolute: 121.4 10*3/uL (ref 19.0–186.0)
Retic Ct Pct: 3.5 % — ABNORMAL HIGH (ref 0.4–3.1)
Reticulocyte Hemoglobin: 34.4 pg (ref >27.9)

## 2022-03-21 LAB — PROTIME-INR
INR: 1.8 — ABNORMAL HIGH (ref 0.8–1.2)
Prothrombin Time: 20.8 seconds — ABNORMAL HIGH (ref 11.4–15.2)

## 2022-03-21 LAB — SAMPLE TO BLOOD BANK

## 2022-03-21 LAB — CMP (CANCER CENTER ONLY)
ALT: 15 U/L (ref 0–44)
AST: 20 U/L (ref 15–41)
Albumin: 3.6 g/dL (ref 3.5–5.0)
Alkaline Phosphatase: 57 U/L (ref 38–126)
Anion gap: 10 (ref 5–15)
BUN: 106 mg/dL — ABNORMAL HIGH (ref 8–23)
CO2: 20 mmol/L — ABNORMAL LOW (ref 22–32)
Calcium: 8.9 mg/dL (ref 8.9–10.3)
Chloride: 101 mmol/L (ref 98–111)
Creatinine: 2.42 mg/dL — ABNORMAL HIGH (ref 0.44–1.00)
GFR, Estimated: 19 mL/min — ABNORMAL LOW (ref >60)
Glucose, Bld: 256 mg/dL — ABNORMAL HIGH (ref 70–99)
Potassium: 4.7 mmol/L (ref 3.5–5.1)
Sodium: 131 mmol/L — ABNORMAL LOW (ref 135–145)
Total Bilirubin: 0.5 mg/dL (ref 0.3–1.2)
Total Protein: 5.7 g/dL — ABNORMAL LOW (ref 6.5–8.1)

## 2022-03-21 NOTE — Progress Notes (Unsigned)
Per Mendel Ryder NP, no transfusion needed today for pt HGB 10.9. Pt notified.

## 2022-03-22 ENCOUNTER — Other Ambulatory Visit (HOSPITAL_COMMUNITY): Payer: Self-pay | Admitting: Adult Health

## 2022-03-23 ENCOUNTER — Ambulatory Visit (INDEPENDENT_AMBULATORY_CARE_PROVIDER_SITE_OTHER): Payer: Medicare HMO

## 2022-03-23 ENCOUNTER — Other Ambulatory Visit: Payer: Self-pay | Admitting: Physician Assistant

## 2022-03-23 DIAGNOSIS — Z7901 Long term (current) use of anticoagulants: Secondary | ICD-10-CM

## 2022-03-23 DIAGNOSIS — Z952 Presence of prosthetic heart valve: Secondary | ICD-10-CM

## 2022-03-23 NOTE — Patient Instructions (Signed)
Description   Spoke with pt and instructed to take 1.5 tablets today and then continue taking 1 tablet daily except 1.5 tablets on Sundays and Wednesdays.  Recheck INR on Tuesday, 03/28/22 at [facility].  Call with Prednisone dosage changes ('20mg'$  daily)  Please keep green leafy veggies and boost in your diet.   nm Call with any new meds [phone] & if you have any procedures where you need to hold warfarin, have them fax a clearance form or labs to [phone].

## 2022-03-24 ENCOUNTER — Inpatient Hospital Stay (HOSPITAL_BASED_OUTPATIENT_CLINIC_OR_DEPARTMENT_OTHER): Payer: Medicare HMO | Admitting: Physician Assistant

## 2022-03-24 ENCOUNTER — Other Ambulatory Visit: Payer: Self-pay

## 2022-03-24 ENCOUNTER — Inpatient Hospital Stay: Payer: Medicare HMO

## 2022-03-24 VITALS — BP 127/47 | HR 60 | Temp 97.5°F | Resp 16 | Wt 139.0 lb

## 2022-03-24 DIAGNOSIS — I7782 Antineutrophilic cytoplasmic antibody (ANCA) vasculitis: Secondary | ICD-10-CM | POA: Diagnosis not present

## 2022-03-24 DIAGNOSIS — Z794 Long term (current) use of insulin: Secondary | ICD-10-CM | POA: Diagnosis not present

## 2022-03-24 DIAGNOSIS — D591 Autoimmune hemolytic anemia, unspecified: Secondary | ICD-10-CM

## 2022-03-24 DIAGNOSIS — D464 Refractory anemia, unspecified: Secondary | ICD-10-CM | POA: Diagnosis not present

## 2022-03-24 DIAGNOSIS — E1165 Type 2 diabetes mellitus with hyperglycemia: Secondary | ICD-10-CM | POA: Diagnosis not present

## 2022-03-24 DIAGNOSIS — N184 Chronic kidney disease, stage 4 (severe): Secondary | ICD-10-CM | POA: Diagnosis not present

## 2022-03-24 DIAGNOSIS — I130 Hypertensive heart and chronic kidney disease with heart failure and stage 1 through stage 4 chronic kidney disease, or unspecified chronic kidney disease: Secondary | ICD-10-CM | POA: Diagnosis not present

## 2022-03-24 DIAGNOSIS — M7989 Other specified soft tissue disorders: Secondary | ICD-10-CM | POA: Diagnosis not present

## 2022-03-24 DIAGNOSIS — M8580 Other specified disorders of bone density and structure, unspecified site: Secondary | ICD-10-CM | POA: Diagnosis not present

## 2022-03-24 DIAGNOSIS — E1122 Type 2 diabetes mellitus with diabetic chronic kidney disease: Secondary | ICD-10-CM | POA: Diagnosis not present

## 2022-03-24 LAB — RETIC PANEL
Immature Retic Fract: 1.5 % — ABNORMAL LOW (ref 2.3–15.9)
RBC.: 3.23 MIL/uL — ABNORMAL LOW (ref 3.87–5.11)
Retic Count, Absolute: 66.2 10*3/uL (ref 19.0–186.0)
Retic Ct Pct: 2.1 % (ref 0.4–3.1)
Reticulocyte Hemoglobin: 33.7 pg (ref >27.9)

## 2022-03-24 LAB — CBC WITH DIFFERENTIAL (CANCER CENTER ONLY)
Abs Immature Granulocytes: 0 10*3/uL (ref 0.00–0.07)
Basophils Absolute: 0 10*3/uL (ref 0.0–0.1)
Basophils Relative: 0 %
Eosinophils Absolute: 0.1 10*3/uL (ref 0.0–0.5)
Eosinophils Relative: 3 %
HCT: 29.9 % — ABNORMAL LOW (ref 36.0–46.0)
Hemoglobin: 10.2 g/dL — ABNORMAL LOW (ref 12.0–15.0)
Immature Granulocytes: 0 %
Lymphocytes Relative: 27 %
Lymphs Abs: 0.4 10*3/uL — ABNORMAL LOW (ref 0.7–4.0)
MCH: 31.6 pg (ref 26.0–34.0)
MCHC: 34.1 g/dL (ref 30.0–36.0)
MCV: 92.6 fL (ref 80.0–100.0)
Monocytes Absolute: 0.3 10*3/uL (ref 0.1–1.0)
Monocytes Relative: 20 %
Neutro Abs: 0.8 10*3/uL — ABNORMAL LOW (ref 1.7–7.7)
Neutrophils Relative %: 50 %
Platelet Count: 122 10*3/uL — ABNORMAL LOW (ref 150–400)
RBC: 3.23 MIL/uL — ABNORMAL LOW (ref 3.87–5.11)
RDW: 15.2 % (ref 11.5–15.5)
WBC Count: 1.6 10*3/uL — ABNORMAL LOW (ref 4.0–10.5)
nRBC: 0 % (ref 0.0–0.2)

## 2022-03-24 LAB — CMP (CANCER CENTER ONLY)
ALT: 11 U/L (ref 0–44)
AST: 17 U/L (ref 15–41)
Albumin: 3.5 g/dL (ref 3.5–5.0)
Alkaline Phosphatase: 54 U/L (ref 38–126)
Anion gap: 10 (ref 5–15)
BUN: 104 mg/dL — ABNORMAL HIGH (ref 8–23)
CO2: 20 mmol/L — ABNORMAL LOW (ref 22–32)
Calcium: 8.7 mg/dL — ABNORMAL LOW (ref 8.9–10.3)
Chloride: 101 mmol/L (ref 98–111)
Creatinine: 2.48 mg/dL — ABNORMAL HIGH (ref 0.44–1.00)
GFR, Estimated: 19 mL/min — ABNORMAL LOW (ref >60)
Glucose, Bld: 170 mg/dL — ABNORMAL HIGH (ref 70–99)
Potassium: 4.5 mmol/L (ref 3.5–5.1)
Sodium: 131 mmol/L — ABNORMAL LOW (ref 135–145)
Total Bilirubin: 0.5 mg/dL (ref 0.3–1.2)
Total Protein: 5.4 g/dL — ABNORMAL LOW (ref 6.5–8.1)

## 2022-03-27 ENCOUNTER — Encounter: Payer: Self-pay | Admitting: Hematology and Oncology

## 2022-03-27 NOTE — Progress Notes (Signed)
[facility] Follow up:    [name], [name], MD [place] [address]  DIAGNOSIS: Autoimmune hemolytic anemia  SUMMARY OF HEMATOLOGIC HISTORY: [name] is a 81 y.o. [place] woman with multifactorial anemia, as follows:             (a) anemia of chronic inflammation: As of May 2020 she has a SED rate of 95, CRP 1.1, positive ANA and RF; subsequently she was found to be ANCA positive             (b) hemolysis (mild): she has a mildly elevated LDH and a DAT positive for complement, not IgG; this is c/w (a) above             (c) anemia of renal insufficiency: inadequate EPO resonse to anemia                         (d) GIB/ iron deficiency in patient on lifelong anticoagulaton   (1) Feraheme received 03/01/2019, repeated 03/20/2019              (a) reticulocyte 03/07/2019 up from 43.4 a year ago to 191.8 after iron infusion             (b) feraheme repeated on 05/12/2019             (c) subsequent ferritin levels >100   (2) darbepoetin: starting 05/05/2019 at 176mg given every 2 weeks             (a) dose increased to 200 mcg every week starting 07/01/2019              (b) back to every 2 weeks beginning 08/04/2019 still at 200 mcg dose             (c) switched to Retacrit every 4 weeks as of 08/18/2019   (3) ANCA positive vasculitis: diagnosed 05/08/2019 (titer 1:640)             (a) prednisone 40 mg daily started 05/31/2019             (b) rituximab weekly started 06/12/2019 (received test dose 06/05/2019)                         (i) hepatitis B sAg, core Ab and HIV negative 05/08/2019                         (ii) rituximab held after 07/14/2019 dose (received 5 weekly doses) with neutropenia                         (iii) rituximab resumed 08/18/2019 but changed to monthly                         (iv) rituximab changed to every 2 months beginning with 02/02/2020 dose                         (v) rituximab changed to every 12 weeks after the  10/12/2020 dose             (c) prednisone tapered to off as of 09/15/2019   (4) osteopenia with a T score of -1.5 on bone density 02/28/2016 at the breast center  CURRENT THERAPY: Prednisone and MMF  INTERVAL HISTORY:  [name]  81 y.o. female returns for her follow-up of anemia.She reports her energy levels are fairly stable. She denies any appetite or weight changes. She reports some nausea this past Monday but denies any vomiting episodes. She denies any abdominal pain or bowel habit changes. She denies easy bruising or signs of active bleeding. The bilateral lower extremity swelling is stable. She reports persistent shortness of breath with exertion. She denies fevers, chills, sweats, chest pain or cough.  She is tolerating prednisone 20 mg daily and MMF 1000 mg daily.  Rest of the pertinent 10 point ROS reviewed and negative.   Patient Active Problem List   Diagnosis Date Noted   Hyperkalemia 02/04/2022   Acute renal failure superimposed on stage 3b chronic kidney disease ([place]) 02/04/2022   Hyperglycemia 01/09/2022   Autoimmune hemolytic anemia (HCC) 01/02/2022   Bilateral lower extremity edema 01/02/2022   Refractory anemia (HCC) 12/15/2021   Abnormal chest x-ray 12/15/2021   Supratherapeutic INR 12/15/2021   Elevated troponin 12/15/2021   Unilateral primary osteoarthritis, right hip 05/31/2021   Midline cystocele 05/30/2021   Primary localized osteoarthritis of pelvic region and thigh 05/30/2021   Vaginal pain 05/30/2021   Asymmetric SNHL (sensorineural hearing loss) 01/06/2021   Referred otalgia of both ears 01/06/2021   Bilateral hearing loss 08/31/2020   Low back pain 02/20/2020   CKD (chronic kidney disease), stage IV ([place]) 10/13/2019   Leukopenia due to antineoplastic chemotherapy ([place]) 08/18/2019   Thrombophilia ([place]) 06/19/2019   Encounter for general adult medical examination without abnormal findings 06/13/2019   Arteritis ([place]) 06/06/2019   Hypertensive  heart and chronic kidney disease with heart failure and stage 1 through stage 4 chronic kidney disease, or unspecified chronic kidney disease ([place]) 06/06/2019   Muscle weakness 06/06/2019   Pancytopenia ([place]) 05/28/2019   Dyspnea 05/28/2019   Vasculitis, ANCA positive 05/28/2019   Disorder of connective tissue ([place]) 05/14/2019   Renal insufficiency 05/08/2019   Acute on chronic diastolic CHF (congestive heart failure) ([place]) 04/07/2019   Left leg cellulitis 04/07/2019   PAF (paroxysmal atrial fibrillation) ([place]) 04/07/2019   Iron deficiency anemia 02/28/2019   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 ([place]) 02/28/2019   Diabetic retinopathy associated with type 2 diabetes mellitus ([place]) 09/02/2018   Chronic pain 06/19/2018   Non-thrombocytopenic purpura ([place]) 05/21/2018   Gout 03/22/2018   Malnutrition of moderate degree 03/12/2018   Diabetes mellitus type 2, controlled ([place]) 03/06/2018   S/P cholecystectomy 03/06/2018   HTN (hypertension) 03/06/2018   H/O mechanical aortic valve replacement 03/06/2018   Moderate to severe pulmonary hypertension ([place]) 03/06/2018   GERD (gastroesophageal reflux disease) 02/21/2018   Anxiety 02/21/2018   Chronic diastolic CHF (congestive heart failure) ([place]) 02/21/2018   Diarrhea 02/21/2018   General weakness 02/02/2018   Thrombocytopenia ([place]) 01/25/2018   Arm pain, diffuse 01/25/2018   Anemia 01/23/2018   Aortic atherosclerosis ([place]) 01/23/2018   GI bleed 01/18/2018   Age-related osteoporosis without current pathological fracture 05/24/2017   Carotid artery occlusion 05/24/2017   Generalized anxiety disorder 05/24/2017   Hearing loss 05/24/2017   Presence of prosthetic heart valve 05/24/2017   Carotid artery disease ([place]) 08/18/2013   Peripheral arterial disease ([place]) 08/18/2013   Diabetes mellitus without complication ([place]) 00/17/4944   Hyponatremia 10/06/2011   Hypertension    Hypercholesteremia    Mechanical heart  valve present    Chronic anticoagulation     is allergic to flagyl [metronidazole], ciprofloxacin, coreg [carvedilol], diflucan [fluconazole], losartan, privigen [immune globulin (human)], sulfamethoxazole-trimethoprim, verapamil, zetia [  ezetimibe], zocor [simvastatin - high dose], and gatifloxacin.  MEDICAL HISTORY: Past Medical History:  Diagnosis Date   Aortic atherosclerosis ([place]) 01/23/2018   Atrial fibrillation (HCC)    Benign positional vertigo 10/06/2011   Carotid artery disease ([place])    Chemotherapy induced neutropenia ([place]) 07/21/2019   Cholelithiasis 01/23/2018   Cholelithiasis 01/23/2018   Chronic anticoagulation    Coronary artery disease    status post coronary artery bypass grafting times 07/10/2004   Diabetes mellitus    Hypercholesteremia    Hypertension    Mechanical heart valve present    H. aortic valve replacement at the time of bypass surgery October 2005   Moderate to severe pulmonary hypertension ([place])    Peripheral arterial disease ([place])    history of left common iliac artery PTA and stenting for a chronic total occlusion 08/26/01    SURGICAL HISTORY: Past Surgical History:  Procedure Laterality Date   anterior repair  2009   AORTIC VALVE REPLACEMENT  2005   Dr. Cyndia Bent   CARDIAC CATHETERIZATION  11/10/2004   40% right common illiac, 70% in stent restenosis of distal left common illiac,    CARDIAC CATHETERIZATION  05/18/2004   LAD 50-70% midstenosis, RCA dominant w/50% stenosis, 50% Right common Illiac artery ostial stenosis, 90% in stent restenosis within midportion of left common illiac stent   Carotid Duplex  03/12/2012   RSA-elev. velocities suggestive of a 50-69% diameter reduction, Right&Left Bulb/Prox ICA-mild-mod.fibrous plaqueelevating Velocities abnormal study.   CHOLECYSTECTOMY N/A 03/01/2018   Procedure: LAPAROSCOPIC CHOLECYSTECTOMY;  Surgeon: Judeth Horn, MD;  Location: [facility];  Service: General;  Laterality: N/A;   COLONOSCOPY WITH PROPOFOL  N/A 01/22/2018   Procedure: COLONOSCOPY WITH PROPOFOL;  Surgeon: Wilford Corner, MD;  Location: [facility];  Service: Endoscopy;  Laterality: N/A;   ESOPHAGOGASTRODUODENOSCOPY (EGD) WITH PROPOFOL N/A 01/22/2018   Procedure: ESOPHAGOGASTRODUODENOSCOPY (EGD) WITH PROPOFOL;  Surgeon: Wilford Corner, MD;  Location: [facility];  Service: Endoscopy;  Laterality: N/A;   ESOPHAGOGASTRODUODENOSCOPY (EGD) WITH PROPOFOL N/A 11/21/2018   Procedure: ESOPHAGOGASTRODUODENOSCOPY (EGD) WITH PROPOFOL;  Surgeon: [name], MD;  Location: [facility];  Service: Gastroenterology;  Laterality: N/A;   ESOPHAGOGASTRODUODENOSCOPY (EGD) WITH PROPOFOL Left 02/27/2019   Procedure: ESOPHAGOGASTRODUODENOSCOPY (EGD) WITH PROPOFOL;  Surgeon: Arta Silence, MD;  [facility] END[facility]COPY;  Service: Endoscopy;  Laterality: Left;   GIVENS CAPSULE STUDY N/A 11/21/2018   Procedure: GIVENS CAPSULE STUDY;  Surgeon: [name], MD;  Location: [facility];  Service: Gastroenterology;  Laterality: N/A;  To be deployed during EGD   Lower Ext. Duplex  03/12/2012   Right Proximal CIA- vessel narrowing w/elevated velocities 0-49% diameter reduction. Right SFA-mild mixed density plaque throughout vessel.   NM MYOCAR PERF WALL MOTION  05/19/2010   protocol: Persantine, post stress EF 65%, negative for ischemia, low risk scan   RIGHT HEART CATH N/A 06/27/2018   Procedure: RIGHT HEART CATH;  Surgeon: [name], MD;  Location: [facility];  Service: Cardiovascular;  Laterality: N/A;   TOTAL ABDOMINAL HYSTERECTOMY W/ BILATERAL SALPINGOOPHORECTOMY  1989   TRANSTHORACIC ECHOCARDIOGRAM  08/29/2012   Moderately calcified annulus of mitral valve, moderate regurg. of both mitral valve and tricuspid valve.     SOCIAL HISTORY: Social History   Socioeconomic History   Marital status: Widowed    Spouse name: Not on file   Number of children: Not on file   Years of education: Not on file   Highest education level: Not on file   Occupational History   Occupation: Retired  Tobacco Use   Smoking status: Former    Packs/day: 1.00    Years: 30.00    Total pack years: 30.00    Types: Cigarettes   Smokeless tobacco: Never   Tobacco comments:    quit smoking 2005  Vaping Use   Vaping Use: Never used  Substance and Sexual Activity   Alcohol use: No    Alcohol/week: 0.0 standard drinks of alcohol   Drug use: No   Sexual activity: Not Currently    Birth control/protection: None  Other Topics Concern   Not on file  Social History Narrative   Not on file   Social Determinants of Health   Financial Resource Strain: Low Risk  (02/28/2022)   Overall Financial Resource Strain (CARDIA)    Difficulty of Paying Living Expenses: Not hard at all  Food Insecurity: No Food Insecurity (01/13/2022)   Hunger Vital Sign    Worried About Running Out of Food in the Last Year: Never true    Ran Out of Food in the Last Year: Never true  Transportation Needs: No Transportation Needs (07/13/2021)   PRAPARE - Hydrologist (Medical): No    Lack of Transportation (Non-Medical): No  Physical Activity: Inactive (02/28/2022)   Exercise Vital Sign    Days of Exercise per Week: 0 days    Minutes of Exercise per Session: 0 min  Stress: No Stress Concern Present (02/28/2022)   [place]    Feeling of Stress : Only a little  Social Connections: Socially Isolated (11/25/2019)   Social Connection and Isolation Panel [NHANES]    Frequency of Communication with Friends and Family: More than three times a week    Frequency of Social Gatherings with Friends and Family: Once a week    Attends Religious Services: Never    Marine scientist or Organizations: No    Attends Archivist Meetings: Never    Marital Status: Widowed  Intimate Partner Violence: Not At Risk (02/28/2022)   Humiliation, Afraid, Rape, and Kick questionnaire    Fear  of Current or Ex-Partner: No    Emotionally Abused: No    Physically Abused: No    Sexually Abused: No    FAMILY HISTORY: Family History  Problem Relation Age of Onset   Heart disease Father    Hypertension Father    Hyperlipidemia Father    Breast cancer Neg Hx     Review of Systems  Constitutional:  Positive for fatigue. Negative for appetite change, chills, fever and unexpected weight change.  HENT:   Negative for hearing loss, lump/mass and trouble swallowing.   Eyes:  Negative for eye problems and icterus.  Respiratory:  Positive for shortness of breath. Negative for chest tightness and cough.   Cardiovascular:  Positive for leg swelling. Negative for chest pain and palpitations.  Gastrointestinal:  Negative for abdominal distention, abdominal pain, constipation, diarrhea, nausea and vomiting.  Endocrine: Negative for hot flashes.  Genitourinary:  Negative for difficulty urinating.   Musculoskeletal:  Negative for arthralgias.  Skin:  Negative for itching and rash.  Neurological:  Negative for dizziness, extremity weakness, headaches and numbness.  Hematological:  Negative for adenopathy. Does not bruise/bleed easily.  Psychiatric/Behavioral:  Negative for depression. The patient is not nervous/anxious.       PHYSICAL EXAMINATION  ECOG PERFORMANCE STATUS: 1 - Symptomatic but completely ambulatory  Vitals:   03/24/22 1053  BP: (!) 127/47  Pulse: 60  Resp: 16  Temp: (!) 97.5 F (36.4 C)  SpO2: 97%    Physical Exam Constitutional:      General: She is not in acute distress.    Appearance: Normal appearance. She is not toxic-appearing.     Comments: She is in a wheelchair  HENT:     Head: Normocephalic and atraumatic.  Eyes:     General: No scleral icterus. Cardiovascular:     Rate and Rhythm: Normal rate and regular rhythm.     Pulses: Normal pulses.     Heart sounds: Normal heart sounds.  Pulmonary:     Effort: Pulmonary effort is normal.     Breath  sounds: Normal breath sounds.  Abdominal:     General: Abdomen is flat. Bowel sounds are normal. There is no distension.     Palpations: Abdomen is soft.     Tenderness: There is no abdominal tenderness. There is no guarding.  Musculoskeletal:        General: Swelling (She has her legs wrapped in Ace bandages today.  Bilateral lower extremity swelling improving) present.     Cervical back: Neck supple.  Lymphadenopathy:     Cervical: No cervical adenopathy.  Skin:    General: Skin is warm and dry.     Findings: No rash.  Neurological:     General: No focal deficit present.     Mental Status: She is alert.  Psychiatric:        Mood and Affect: Mood normal.        Behavior: Behavior normal.     LABORATORY DATA:  CBC    Component Value Date/Time   WBC 1.6 (L) 03/24/2022 1008   WBC 1.9 (L) 03/21/2022 1245   RBC 3.23 (L) 03/24/2022 1008   RBC 3.23 (L) 03/24/2022 1007   HGB 10.2 (L) 03/24/2022 1008   HCT 29.9 (L) 03/24/2022 1008   HCT 28.5 (L) 11/20/2018 1824   PLT 122 (L) 03/24/2022 1008   MCV 92.6 03/24/2022 1008   MCH 31.6 03/24/2022 1008   MCHC 34.1 03/24/2022 1008   RDW 15.2 03/24/2022 1008   LYMPHSABS 0.4 (L) 03/24/2022 1008   MONOABS 0.3 03/24/2022 1008   EOSABS 0.1 03/24/2022 1008   BASOSABS 0.0 03/24/2022 1008    CMP     Component Value Date/Time   NA 131 (L) 03/24/2022 1008   NA 137 12/06/2017 1436   K 4.5 03/24/2022 1008   CL 101 03/24/2022 1008   CO2 20 (L) 03/24/2022 1008   GLUCOSE 170 (H) 03/24/2022 1008   BUN 104 (H) 03/24/2022 1008   BUN 20 12/06/2017 1436   CREATININE 2.48 (H) 03/24/2022 1008   CALCIUM 8.7 (L) 03/24/2022 1008   PROT 5.4 (L) 03/24/2022 1008   ALBUMIN 3.5 03/24/2022 1008   AST 17 03/24/2022 1008   ALT 11 03/24/2022 1008   ALKPHOS 54 03/24/2022 1008   BILITOT 0.5 03/24/2022 1008   GFRNONAA 19 (L) 03/24/2022 1008   GFRAA 32 (L) 03/30/2020 0932     ASSESSMENT and THERAPY PLAN:  MARGARIT MINSHALL is a 81 y.o. female who presents  for multifactorial anemia, previously treated with rituximab for ANCA positive hemolytic anemia, previously on maintenance rituximab every 12 weeks presented with worsening anemia thought to be related to her hemolysis.  She was diagnosed with elevated ANCA titers 1 is to 640 and previously treated for ANCA positive hemolytic anemia, seen by rheumatology Dr. Salome Holmes back in 2020, responded at that time to prednisone and  rituximab and continued on rituximab maintenance.  However her rituximab was changed to every 28-monthinfusions and she relapsed before her second cycle of every 366-monthituximab.   # Multifactorial Anemia:  --Bone marrow biopsy from 02/20/2022 showed normocellular marrow with trilineage hematopoiesis. No leukemia, lymphoma or high-grade dysplasia identified. --CT CAP from 02/20/2022 did not show any underlying cause for anemia.  --ANCA titers are normal when checked on 02/10/2022 --Currently on prednisone 20 mg p.o. daily and mycophenolate mofetil to 1000 mg PO daily.  Recommend to continue on current dose. --Labs today show WBC 1.6, Hgb 10.2, MCV 92.6, Plt 122 --No need for blood transfusion today.  --Continue with twice weekly labs and standing infusion appointments.   --RTC 03/28/2022 for labs, follow up visit and slot for blood transfusion if needed  Patient expressed understanding of the plan provided. She is aware to reach out if she has any new questions or concerns.   I have spent a total of 25 minutes minutes of face-to-face and non-face-to-face time, preparing to see the patient, performing a medically appropriate examination, counseling and educating the patient, documenting clinical information in the electronic health record, and care coordination.   IrDede QueryA-C Dept of Hematology and [facility]: [phone]

## 2022-03-28 ENCOUNTER — Inpatient Hospital Stay: Payer: Medicare HMO

## 2022-03-28 ENCOUNTER — Encounter: Payer: Self-pay | Admitting: Hematology and Oncology

## 2022-03-28 ENCOUNTER — Inpatient Hospital Stay (HOSPITAL_BASED_OUTPATIENT_CLINIC_OR_DEPARTMENT_OTHER): Payer: Medicare HMO | Admitting: Hematology and Oncology

## 2022-03-28 ENCOUNTER — Other Ambulatory Visit: Payer: Self-pay

## 2022-03-28 ENCOUNTER — Ambulatory Visit (INDEPENDENT_AMBULATORY_CARE_PROVIDER_SITE_OTHER): Payer: Medicare HMO | Admitting: *Deleted

## 2022-03-28 ENCOUNTER — Other Ambulatory Visit: Payer: Self-pay | Admitting: Lab

## 2022-03-28 ENCOUNTER — Other Ambulatory Visit: Payer: Self-pay | Admitting: *Deleted

## 2022-03-28 VITALS — BP 130/51 | HR 62 | Temp 98.1°F | Resp 16 | Ht 59.0 in | Wt 140.4 lb

## 2022-03-28 DIAGNOSIS — D591 Autoimmune hemolytic anemia, unspecified: Secondary | ICD-10-CM | POA: Diagnosis not present

## 2022-03-28 DIAGNOSIS — Z952 Presence of prosthetic heart valve: Secondary | ICD-10-CM | POA: Diagnosis not present

## 2022-03-28 DIAGNOSIS — R739 Hyperglycemia, unspecified: Secondary | ICD-10-CM | POA: Diagnosis not present

## 2022-03-28 DIAGNOSIS — E1122 Type 2 diabetes mellitus with diabetic chronic kidney disease: Secondary | ICD-10-CM | POA: Diagnosis not present

## 2022-03-28 DIAGNOSIS — E1165 Type 2 diabetes mellitus with hyperglycemia: Secondary | ICD-10-CM | POA: Diagnosis not present

## 2022-03-28 DIAGNOSIS — I130 Hypertensive heart and chronic kidney disease with heart failure and stage 1 through stage 4 chronic kidney disease, or unspecified chronic kidney disease: Secondary | ICD-10-CM | POA: Diagnosis not present

## 2022-03-28 DIAGNOSIS — Z7901 Long term (current) use of anticoagulants: Secondary | ICD-10-CM | POA: Diagnosis not present

## 2022-03-28 DIAGNOSIS — I7782 Antineutrophilic cytoplasmic antibody (ANCA) vasculitis: Secondary | ICD-10-CM | POA: Diagnosis not present

## 2022-03-28 DIAGNOSIS — M8580 Other specified disorders of bone density and structure, unspecified site: Secondary | ICD-10-CM | POA: Diagnosis not present

## 2022-03-28 DIAGNOSIS — D594 Other nonautoimmune hemolytic anemias: Secondary | ICD-10-CM

## 2022-03-28 DIAGNOSIS — M7989 Other specified soft tissue disorders: Secondary | ICD-10-CM | POA: Diagnosis not present

## 2022-03-28 DIAGNOSIS — N184 Chronic kidney disease, stage 4 (severe): Secondary | ICD-10-CM | POA: Diagnosis not present

## 2022-03-28 DIAGNOSIS — Z794 Long term (current) use of insulin: Secondary | ICD-10-CM | POA: Diagnosis not present

## 2022-03-28 LAB — RETIC PANEL
Immature Retic Fract: 3.6 % (ref 2.3–15.9)
RBC.: 3.06 MIL/uL — ABNORMAL LOW (ref 3.87–5.11)
Retic Count, Absolute: 55.4 10*3/uL (ref 19.0–186.0)
Retic Ct Pct: 1.8 % (ref 0.4–3.1)
Reticulocyte Hemoglobin: 33.6 pg (ref >27.9)

## 2022-03-28 LAB — CBC WITH DIFFERENTIAL/PLATELET
Abs Immature Granulocytes: 0.02 10*3/uL (ref 0.00–0.07)
Basophils Absolute: 0 10*3/uL (ref 0.0–0.1)
Basophils Relative: 0 %
Eosinophils Absolute: 0 10*3/uL (ref 0.0–0.5)
Eosinophils Relative: 1 %
HCT: 28.8 % — ABNORMAL LOW (ref 36.0–46.0)
Hemoglobin: 9.7 g/dL — ABNORMAL LOW (ref 12.0–15.0)
Immature Granulocytes: 1 %
Lymphocytes Relative: 15 %
Lymphs Abs: 0.3 10*3/uL — ABNORMAL LOW (ref 0.7–4.0)
MCH: 31.5 pg (ref 26.0–34.0)
MCHC: 33.7 g/dL (ref 30.0–36.0)
MCV: 93.5 fL (ref 80.0–100.0)
Monocytes Absolute: 0.2 10*3/uL (ref 0.1–1.0)
Monocytes Relative: 11 %
Neutro Abs: 1.3 10*3/uL — ABNORMAL LOW (ref 1.7–7.7)
Neutrophils Relative %: 72 %
Platelets: 130 10*3/uL — ABNORMAL LOW (ref 150–400)
RBC: 3.08 MIL/uL — ABNORMAL LOW (ref 3.87–5.11)
RDW: 14.8 % (ref 11.5–15.5)
WBC: 1.8 10*3/uL — ABNORMAL LOW (ref 4.0–10.5)
nRBC: 0 % (ref 0.0–0.2)

## 2022-03-28 LAB — CMP (CANCER CENTER ONLY)
ALT: 14 U/L (ref 0–44)
AST: 19 U/L (ref 15–41)
Albumin: 3.4 g/dL — ABNORMAL LOW (ref 3.5–5.0)
Alkaline Phosphatase: 51 U/L (ref 38–126)
Anion gap: 10 (ref 5–15)
BUN: 105 mg/dL — ABNORMAL HIGH (ref 8–23)
CO2: 20 mmol/L — ABNORMAL LOW (ref 22–32)
Calcium: 8.7 mg/dL — ABNORMAL LOW (ref 8.9–10.3)
Chloride: 102 mmol/L (ref 98–111)
Creatinine: 2.53 mg/dL — ABNORMAL HIGH (ref 0.44–1.00)
GFR, Estimated: 18 mL/min — ABNORMAL LOW (ref >60)
Glucose, Bld: 208 mg/dL — ABNORMAL HIGH (ref 70–99)
Potassium: 4.4 mmol/L (ref 3.5–5.1)
Sodium: 132 mmol/L — ABNORMAL LOW (ref 135–145)
Total Bilirubin: 0.4 mg/dL (ref 0.3–1.2)
Total Protein: 5.6 g/dL — ABNORMAL LOW (ref 6.5–8.1)

## 2022-03-28 LAB — SAMPLE TO BLOOD BANK

## 2022-03-28 LAB — PROTIME-INR
INR: 1.9 — ABNORMAL HIGH (ref 0.8–1.2)
Prothrombin Time: 21.2 seconds — ABNORMAL HIGH (ref 11.4–15.2)

## 2022-03-28 NOTE — Progress Notes (Signed)
[facility] Follow up:    [name], [name], MD [place] [address]  DIAGNOSIS: Autoimmune hemolytic anemia  SUMMARY OF HEMATOLOGIC HISTORY: [name] is a 81 y.o. [place] woman with multifactorial anemia, as follows:             (a) anemia of chronic inflammation: As of May 2020 she has a SED rate of 95, CRP 1.1, positive ANA and RF; subsequently she was found to be ANCA positive             (b) hemolysis (mild): she has a mildly elevated LDH and a DAT positive for complement, not IgG; this is c/w (a) above             (c) anemia of renal insufficiency: inadequate EPO resonse to anemia                         (d) GIB/ iron deficiency in patient on lifelong anticoagulaton   (1) Feraheme received 03/01/2019, repeated 03/20/2019              (a) reticulocyte 03/07/2019 up from 43.4 a year ago to 191.8 after iron infusion             (b) feraheme repeated on 05/12/2019             (c) subsequent ferritin levels >100   (2) [name]: starting 05/05/2019 at 176mg given every 2 weeks             (a) dose increased to 200 mcg every week starting 07/01/2019              (b) back to every 2 weeks beginning 08/04/2019 still at 200 mcg dose             (c) switched to [name] every 4 weeks as of 08/18/2019   (3) ANCA positive vasculitis: diagnosed 05/08/2019 (titer 1:640)             (a) prednisone 40 mg daily started 05/31/2019             (b) rituximab weekly started 06/12/2019 (received test dose 06/05/2019)                         (i) hepatitis B sAg, core Ab and HIV negative 05/08/2019                         (ii) rituximab held after 07/14/2019 dose (received 5 weekly doses) with neutropenia                         (iii) rituximab resumed 08/18/2019 but changed to monthly                         (iv) rituximab changed to every 2 months beginning with 02/02/2020 dose                         (v) rituximab changed to every 12 weeks after the  10/12/2020 dose             (c) prednisone tapered to off as of 09/15/2019   (4) osteopenia with a T score of -1.5 on bone density 02/28/2016 at the breast center  CURRENT THERAPY: Prednisone and MMF  INTERVAL HISTORY:  [name]  81 y.o. female returns for her follow-up of anemia.She reports her energy levels are fairly stable. She denies any appetite or weight changes. She reports some nausea this past Monday but denies any vomiting episodes. She denies any abdominal pain or bowel habit changes. She denies easy bruising or signs of active bleeding. The bilateral lower extremity swelling is stable. She reports persistent shortness of breath with exertion. She denies fevers, chills, sweats, chest pain or cough.  She is tolerating prednisone 20 mg daily and MMF 1000 mg daily.  Rest of the pertinent 10 point ROS reviewed and negative.   Patient Active Problem List   Diagnosis Date Noted   Hyperkalemia 02/04/2022   Acute renal failure superimposed on stage 3b chronic kidney disease ([place]) 02/04/2022   Hyperglycemia 01/09/2022   Autoimmune hemolytic anemia (HCC) 01/02/2022   Bilateral lower extremity edema 01/02/2022   Refractory anemia (HCC) 12/15/2021   Abnormal chest x-ray 12/15/2021   Supratherapeutic INR 12/15/2021   Elevated troponin 12/15/2021   Unilateral primary osteoarthritis, right hip 05/31/2021   Midline cystocele 05/30/2021   Primary localized osteoarthritis of pelvic region and thigh 05/30/2021   Vaginal pain 05/30/2021   Asymmetric SNHL (sensorineural hearing loss) 01/06/2021   Referred otalgia of both ears 01/06/2021   Bilateral hearing loss 08/31/2020   Low back pain 02/20/2020   CKD (chronic kidney disease), stage IV ([place]) 10/13/2019   Leukopenia due to antineoplastic chemotherapy ([place]) 08/18/2019   Thrombophilia ([place]) 06/19/2019   Encounter for general adult medical examination without abnormal findings 06/13/2019   Arteritis ([place]) 06/06/2019   Hypertensive  heart and chronic kidney disease with heart failure and stage 1 through stage 4 chronic kidney disease, or unspecified chronic kidney disease ([place]) 06/06/2019   Muscle weakness 06/06/2019   Pancytopenia ([place]) 05/28/2019   Dyspnea 05/28/2019   Vasculitis, ANCA positive 05/28/2019   Disorder of connective tissue ([place]) 05/14/2019   Renal insufficiency 05/08/2019   Acute on chronic diastolic CHF (congestive heart failure) ([place]) 04/07/2019   Left leg cellulitis 04/07/2019   PAF (paroxysmal atrial fibrillation) ([place]) 04/07/2019   Iron deficiency anemia 02/28/2019   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 ([place]) 02/28/2019   Diabetic retinopathy associated with type 2 diabetes mellitus ([place]) 09/02/2018   Chronic pain 06/19/2018   Non-thrombocytopenic purpura ([place]) 05/21/2018   Gout 03/22/2018   Malnutrition of moderate degree 03/12/2018   Diabetes mellitus type 2, controlled ([place]) 03/06/2018   S/P cholecystectomy 03/06/2018   HTN (hypertension) 03/06/2018   H/O mechanical aortic valve replacement 03/06/2018   Moderate to severe pulmonary hypertension ([place]) 03/06/2018   GERD (gastroesophageal reflux disease) 02/21/2018   Anxiety 02/21/2018   Chronic diastolic CHF (congestive heart failure) ([place]) 02/21/2018   Diarrhea 02/21/2018   General weakness 02/02/2018   Thrombocytopenia ([place]) 01/25/2018   Arm pain, diffuse 01/25/2018   Anemia 01/23/2018   Aortic atherosclerosis ([place]) 01/23/2018   GI bleed 01/18/2018   Age-related osteoporosis without current pathological fracture 05/24/2017   Carotid artery occlusion 05/24/2017   Generalized anxiety disorder 05/24/2017   Hearing loss 05/24/2017   Presence of prosthetic heart valve 05/24/2017   Carotid artery disease ([place]) 08/18/2013   Peripheral arterial disease ([place]) 08/18/2013   Diabetes mellitus without complication ([place]) 00/17/4944   Hyponatremia 10/06/2011   Hypertension    Hypercholesteremia    Mechanical heart  valve present    Chronic anticoagulation     is allergic to flagyl [metronidazole], ciprofloxacin, coreg [carvedilol], diflucan [fluconazole], losartan, privigen [immune globulin (human)], sulfamethoxazole-trimethoprim, verapamil, zetia [  ezetimibe], zocor [simvastatin - high dose], and gatifloxacin.  MEDICAL HISTORY: Past Medical History:  Diagnosis Date   Aortic atherosclerosis ([place]) 01/23/2018   Atrial fibrillation (HCC)    Benign positional vertigo 10/06/2011   Carotid artery disease ([place])    Chemotherapy induced neutropenia ([place]) 07/21/2019   Cholelithiasis 01/23/2018   Cholelithiasis 01/23/2018   Chronic anticoagulation    Coronary artery disease    status post coronary artery bypass grafting times 07/10/2004   Diabetes mellitus    Hypercholesteremia    Hypertension    Mechanical heart valve present    H. aortic valve replacement at the time of bypass surgery October 2005   Moderate to severe pulmonary hypertension ([place])    Peripheral arterial disease ([place])    history of left common iliac artery PTA and stenting for a chronic total occlusion 08/26/01    SURGICAL HISTORY: Past Surgical History:  Procedure Laterality Date   anterior repair  2009   AORTIC VALVE REPLACEMENT  2005   Dr. Cyndia Bent   CARDIAC CATHETERIZATION  11/10/2004   40% right common illiac, 70% in stent restenosis of distal left common illiac,    CARDIAC CATHETERIZATION  05/18/2004   LAD 50-70% midstenosis, RCA dominant w/50% stenosis, 50% Right common Illiac artery ostial stenosis, 90% in stent restenosis within midportion of left common illiac stent   Carotid Duplex  03/12/2012   RSA-elev. velocities suggestive of a 50-69% diameter reduction, Right&Left Bulb/Prox ICA-mild-mod.fibrous plaqueelevating Velocities abnormal study.   CHOLECYSTECTOMY N/A 03/01/2018   Procedure: LAPAROSCOPIC CHOLECYSTECTOMY;  Surgeon: Judeth Horn, MD;  Location: [facility];  Service: General;  Laterality: N/A;   COLONOSCOPY WITH PROPOFOL  N/A 01/22/2018   Procedure: COLONOSCOPY WITH PROPOFOL;  Surgeon: Wilford Corner, MD;  Location: [facility];  Service: Endoscopy;  Laterality: N/A;   ESOPHAGOGASTRODUODENOSCOPY (EGD) WITH PROPOFOL N/A 01/22/2018   Procedure: ESOPHAGOGASTRODUODENOSCOPY (EGD) WITH PROPOFOL;  Surgeon: Wilford Corner, MD;  Location: [facility];  Service: Endoscopy;  Laterality: N/A;   ESOPHAGOGASTRODUODENOSCOPY (EGD) WITH PROPOFOL N/A 11/21/2018   Procedure: ESOPHAGOGASTRODUODENOSCOPY (EGD) WITH PROPOFOL;  Surgeon: [name], MD;  Location: [facility];  Service: Gastroenterology;  Laterality: N/A;   ESOPHAGOGASTRODUODENOSCOPY (EGD) WITH PROPOFOL Left 02/27/2019   Procedure: ESOPHAGOGASTRODUODENOSCOPY (EGD) WITH PROPOFOL;  Surgeon: Arta Silence, MD;  [facility] ENDOSCOPY;  Service: Endoscopy;  Laterality: Left;   GIVENS CAPSULE STUDY N/A 11/21/2018   Procedure: GIVENS CAPSULE STUDY;  Surgeon: [name], MD;  Location: [facility];  Service: Gastroenterology;  Laterality: N/A;  To be deployed during EGD   Lower Ext. Duplex  03/12/2012   Right Proximal CIA- vessel narrowing w/elevated velocities 0-49% diameter reduction. Right SFA-mild mixed density plaque throughout vessel.   NM MYOCAR PERF WALL MOTION  05/19/2010   protocol: Persantine, post stress EF 65%, negative for ischemia, low risk scan   RIGHT HEART CATH N/A 06/27/2018   Procedure: RIGHT HEART CATH;  Surgeon: [name], MD;  Location: [facility];  Service: Cardiovascular;  Laterality: N/A;   TOTAL ABDOMINAL HYSTERECTOMY W/ BILATERAL SALPINGOOPHORECTOMY  1989   TRANSTHORACIC ECHOCARDIOGRAM  08/29/2012   Moderately calcified annulus of mitral valve, moderate regurg. of both mitral valve and tricuspid valve.     SOCIAL HISTORY: Social History   Socioeconomic History   Marital status: Widowed    Spouse name: Not on file   Number of children: Not on file   Years of education: Not on file   Highest education level: Not on file   Occupational History   Occupation: Retired  Tobacco Use   Smoking status: Former    Packs/day: 1.00    Years: 30.00    Total pack years: 30.00    Types: Cigarettes   Smokeless tobacco: Never   Tobacco comments:    quit smoking 2005  Vaping Use   Vaping Use: Never used  Substance and Sexual Activity   Alcohol use: No    Alcohol/week: 0.0 standard drinks of alcohol   Drug use: No   Sexual activity: Not Currently    Birth control/protection: None  Other Topics Concern   Not on file  Social History Narrative   Not on file   Social Determinants of Health   Financial Resource Strain: Low Risk  (02/28/2022)   Overall Financial Resource Strain (CARDIA)    Difficulty of Paying Living Expenses: Not hard at all  Food Insecurity: No Food Insecurity (01/13/2022)   Hunger Vital Sign    Worried About Running Out of Food in the Last Year: Never true    Ran Out of Food in the Last Year: Never true  Transportation Needs: No Transportation Needs (07/13/2021)   PRAPARE - Hydrologist (Medical): No    Lack of Transportation (Non-Medical): No  Physical Activity: Inactive (02/28/2022)   Exercise Vital Sign    Days of Exercise per Week: 0 days    Minutes of Exercise per Session: 0 min  Stress: No Stress Concern Present (02/28/2022)   [place]    Feeling of Stress : Only a little  Social Connections: Socially Isolated (11/25/2019)   Social Connection and Isolation Panel [NHANES]    Frequency of Communication with Friends and Family: More than three times a week    Frequency of Social Gatherings with Friends and Family: Once a week    Attends Religious Services: Never    Marine scientist or Organizations: No    Attends Archivist Meetings: Never    Marital Status: Widowed  Intimate Partner Violence: Not At Risk (02/28/2022)   Humiliation, Afraid, Rape, and Kick questionnaire    Fear  of Current or Ex-Partner: No    Emotionally Abused: No    Physically Abused: No    Sexually Abused: No    FAMILY HISTORY: Family History  Problem Relation Age of Onset   Heart disease Father    Hypertension Father    Hyperlipidemia Father    Breast cancer Neg Hx     Review of Systems  Constitutional:  Positive for fatigue. Negative for appetite change, chills, fever and unexpected weight change.  HENT:   Negative for hearing loss, lump/mass and trouble swallowing.   Eyes:  Negative for eye problems and icterus.  Respiratory:  Positive for shortness of breath. Negative for chest tightness and cough.   Cardiovascular:  Positive for leg swelling. Negative for chest pain and palpitations.  Gastrointestinal:  Negative for abdominal distention, abdominal pain, constipation, diarrhea, nausea and vomiting.  Endocrine: Negative for hot flashes.  Genitourinary:  Negative for difficulty urinating.   Musculoskeletal:  Negative for arthralgias.  Skin:  Negative for itching and rash.  Neurological:  Negative for dizziness, extremity weakness, headaches and numbness.  Hematological:  Negative for adenopathy. Does not bruise/bleed easily.  Psychiatric/Behavioral:  Negative for depression. The patient is not nervous/anxious.       PHYSICAL EXAMINATION  ECOG PERFORMANCE STATUS: 1 - Symptomatic but completely ambulatory  Vitals:   03/28/22 1219  BP: (!) 130/51  Pulse: 62  Resp: 16  Temp: 98.1 F (36.7 C)  SpO2: 98%    Physical Exam Constitutional:      General: She is not in acute distress.    Appearance: Normal appearance. She is not toxic-appearing.     Comments: She is in a wheelchair  HENT:     Head: Normocephalic and atraumatic.  Eyes:     General: No scleral icterus. Cardiovascular:     Rate and Rhythm: Normal rate and regular rhythm.     Pulses: Normal pulses.     Heart sounds: Normal heart sounds.  Pulmonary:     Effort: Pulmonary effort is normal.     Breath sounds:  Normal breath sounds.  Abdominal:     General: Abdomen is flat. Bowel sounds are normal. There is no distension.     Palpations: Abdomen is soft.     Tenderness: There is no abdominal tenderness. There is no guarding.  Musculoskeletal:        General: Swelling (She has her legs wrapped in Ace bandages today.  Bilateral lower extremity swelling improving) present.     Cervical back: Neck supple.  Lymphadenopathy:     Cervical: No cervical adenopathy.  Skin:    General: Skin is warm and dry.     Findings: No rash.  Neurological:     General: No focal deficit present.     Mental Status: She is alert.  Psychiatric:        Mood and Affect: Mood normal.        Behavior: Behavior normal.     LABORATORY DATA:  CBC    Component Value Date/Time   WBC 1.8 (L) 03/28/2022 1200   RBC 3.08 (L) 03/28/2022 1200   RBC 3.06 (L) 03/28/2022 1200   HGB 9.7 (L) 03/28/2022 1200   HGB 10.2 (L) 03/24/2022 1008   HCT 28.8 (L) 03/28/2022 1200   HCT 28.5 (L) 11/20/2018 1824   PLT 130 (L) 03/28/2022 1200   PLT 122 (L) 03/24/2022 1008   MCV 93.5 03/28/2022 1200   MCH 31.5 03/28/2022 1200   MCHC 33.7 03/28/2022 1200   RDW 14.8 03/28/2022 1200   LYMPHSABS 0.3 (L) 03/28/2022 1200   MONOABS 0.2 03/28/2022 1200   EOSABS 0.0 03/28/2022 1200   BASOSABS 0.0 03/28/2022 1200    CMP     Component Value Date/Time   NA 131 (L) 03/24/2022 1008   NA 137 12/06/2017 1436   K 4.5 03/24/2022 1008   CL 101 03/24/2022 1008   CO2 20 (L) 03/24/2022 1008   GLUCOSE 170 (H) 03/24/2022 1008   BUN 104 (H) 03/24/2022 1008   BUN 20 12/06/2017 1436   CREATININE 2.48 (H) 03/24/2022 1008   CALCIUM 8.7 (L) 03/24/2022 1008   PROT 5.4 (L) 03/24/2022 1008   ALBUMIN 3.5 03/24/2022 1008   AST 17 03/24/2022 1008   ALT 11 03/24/2022 1008   ALKPHOS 54 03/24/2022 1008   BILITOT 0.5 03/24/2022 1008   GFRNONAA 19 (L) 03/24/2022 1008   GFRAA 32 (L) 03/30/2020 0932     ASSESSMENT and THERAPY PLAN:  SHARNITA BOGUCKI is a 81  y.o. female who presents for multifactorial anemia, previously treated with rituximab for ANCA positive hemolytic anemia, previously on maintenance rituximab every 12 weeks presented with worsening anemia thought to be related to her hemolysis.  She was diagnosed with elevated ANCA titers 1 is to 640 and previously treated for ANCA positive hemolytic anemia, seen by rheumatology Dr. Salome Holmes back in 2020, responded  at that time to prednisone and rituximab and continued on rituximab maintenance.  However her rituximab was changed to every 60-month infusions and she relapsed before her second cycle of every 60-month rituximab.   # Multifactorial Anemia:  --Bone marrow biopsy from 02/20/2022 showed normocellular marrow with trilineage hematopoiesis. No leukemia, lymphoma or high-grade dysplasia identified. --CT CAP from 02/20/2022 did not show any underlying cause for anemia.  --ANCA titers are normal when checked on 02/10/2022 --Currently on prednisone 20 mg p.o. daily and mycophenolate mofetil to 1000 mg PO daily.  Recommend to continue on current dose. -- CBC from today showed a white blood cell count of 1800, hemoglobin of 9.7 and hematochezic of 28.8, platelet count of 130,000, ANC of 1300, reticulocyte count normal --No need for blood transfusion today.  --We will try prednisone taper 1 more time since we uptitrated mycophenolate.  She will try prednisone 15 mg for 1 week. --Continue with twice weekly labs and standing infusion appointments.   Return to clinic in 1 week. Have also encouraged her to see [facility] hematology team for second opinion.  She will call them back. #Hyperglycemia secondary to prednisone use, continue insulin as recommended by primary care team.  We sincerely appreciate Dr. Loren Racer efforts #3 bilateral severe lower extremity swelling, once again stable to mildly improved.  Patient expressed understanding of the plan provided. She is aware to reach out if she has any new  questions or concerns.   I have spent a total of 30 minutes minutes of face-to-face and non-face-to-face time, preparing to see the patient, performing a medically appropriate examination, counseling and educating the patient, documenting clinical information in the electronic health record, and care coordination.

## 2022-03-28 NOTE — Patient Instructions (Signed)
Description   Spoke with pt and instructed to take 1.5 tablets today and then start taking 1 tablet daily except 1.5 tablets on Sundays, Wednesdays, and Fridays. Recheck INR on Tuesday, 04/04/22 at [facility].  Call with Prednisone dosage changes ('15mg'$  daily starting 6/21). Please keep green leafy veggies and boost in your diet. Call with any new meds [phone] & if you have any procedures where you need to hold warfarin, have them fax a clearance form or labs to [phone].

## 2022-03-31 ENCOUNTER — Other Ambulatory Visit: Payer: Self-pay

## 2022-03-31 ENCOUNTER — Inpatient Hospital Stay: Payer: Medicare HMO

## 2022-03-31 DIAGNOSIS — E1122 Type 2 diabetes mellitus with diabetic chronic kidney disease: Secondary | ICD-10-CM | POA: Diagnosis not present

## 2022-03-31 DIAGNOSIS — D591 Autoimmune hemolytic anemia, unspecified: Secondary | ICD-10-CM

## 2022-03-31 DIAGNOSIS — E1165 Type 2 diabetes mellitus with hyperglycemia: Secondary | ICD-10-CM | POA: Diagnosis not present

## 2022-03-31 DIAGNOSIS — D594 Other nonautoimmune hemolytic anemias: Secondary | ICD-10-CM

## 2022-03-31 DIAGNOSIS — I130 Hypertensive heart and chronic kidney disease with heart failure and stage 1 through stage 4 chronic kidney disease, or unspecified chronic kidney disease: Secondary | ICD-10-CM | POA: Diagnosis not present

## 2022-03-31 DIAGNOSIS — Z794 Long term (current) use of insulin: Secondary | ICD-10-CM | POA: Diagnosis not present

## 2022-03-31 DIAGNOSIS — I7782 Antineutrophilic cytoplasmic antibody (ANCA) vasculitis: Secondary | ICD-10-CM | POA: Diagnosis not present

## 2022-03-31 DIAGNOSIS — M7989 Other specified soft tissue disorders: Secondary | ICD-10-CM | POA: Diagnosis not present

## 2022-03-31 DIAGNOSIS — M8580 Other specified disorders of bone density and structure, unspecified site: Secondary | ICD-10-CM | POA: Diagnosis not present

## 2022-03-31 DIAGNOSIS — N184 Chronic kidney disease, stage 4 (severe): Secondary | ICD-10-CM | POA: Diagnosis not present

## 2022-03-31 LAB — CBC WITH DIFFERENTIAL/PLATELET
Abs Immature Granulocytes: 0.03 10*3/uL (ref 0.00–0.07)
Basophils Absolute: 0 10*3/uL (ref 0.0–0.1)
Basophils Relative: 0 %
Eosinophils Absolute: 0 10*3/uL (ref 0.0–0.5)
Eosinophils Relative: 0 %
HCT: 28.3 % — ABNORMAL LOW (ref 36.0–46.0)
Hemoglobin: 9.6 g/dL — ABNORMAL LOW (ref 12.0–15.0)
Immature Granulocytes: 1 %
Lymphocytes Relative: 7 %
Lymphs Abs: 0.3 10*3/uL — ABNORMAL LOW (ref 0.7–4.0)
MCH: 31.6 pg (ref 26.0–34.0)
MCHC: 33.9 g/dL (ref 30.0–36.0)
MCV: 93.1 fL (ref 80.0–100.0)
Monocytes Absolute: 0.2 10*3/uL (ref 0.1–1.0)
Monocytes Relative: 4 %
Neutro Abs: 3.6 10*3/uL (ref 1.7–7.7)
Neutrophils Relative %: 88 %
Platelets: 127 10*3/uL — ABNORMAL LOW (ref 150–400)
RBC: 3.04 MIL/uL — ABNORMAL LOW (ref 3.87–5.11)
RDW: 14.7 % (ref 11.5–15.5)
WBC: 4.1 10*3/uL (ref 4.0–10.5)
nRBC: 0 % (ref 0.0–0.2)

## 2022-03-31 LAB — CMP (CANCER CENTER ONLY)
ALT: 14 U/L (ref 0–44)
AST: 17 U/L (ref 15–41)
Albumin: 3.6 g/dL (ref 3.5–5.0)
Alkaline Phosphatase: 51 U/L (ref 38–126)
Anion gap: 10 (ref 5–15)
BUN: 103 mg/dL — ABNORMAL HIGH (ref 8–23)
CO2: 21 mmol/L — ABNORMAL LOW (ref 22–32)
Calcium: 9 mg/dL (ref 8.9–10.3)
Chloride: 100 mmol/L (ref 98–111)
Creatinine: 2.41 mg/dL — ABNORMAL HIGH (ref 0.44–1.00)
GFR, Estimated: 19 mL/min — ABNORMAL LOW (ref >60)
Glucose, Bld: 192 mg/dL — ABNORMAL HIGH (ref 70–99)
Potassium: 4.8 mmol/L (ref 3.5–5.1)
Sodium: 131 mmol/L — ABNORMAL LOW (ref 135–145)
Total Bilirubin: 0.5 mg/dL (ref 0.3–1.2)
Total Protein: 5.5 g/dL — ABNORMAL LOW (ref 6.5–8.1)

## 2022-03-31 LAB — RETIC PANEL
Immature Retic Fract: 6.1 % (ref 2.3–15.9)
RBC.: 3.03 MIL/uL — ABNORMAL LOW (ref 3.87–5.11)
Retic Count, Absolute: 66.7 10*3/uL (ref 19.0–186.0)
Retic Ct Pct: 2.2 % (ref 0.4–3.1)
Reticulocyte Hemoglobin: 33 pg (ref >27.9)

## 2022-03-31 LAB — PROTIME-INR
INR: 2.4 — ABNORMAL HIGH (ref 0.8–1.2)
Prothrombin Time: 25.8 seconds — ABNORMAL HIGH (ref 11.4–15.2)

## 2022-03-31 LAB — SAMPLE TO BLOOD BANK

## 2022-04-04 ENCOUNTER — Ambulatory Visit (INDEPENDENT_AMBULATORY_CARE_PROVIDER_SITE_OTHER): Payer: Medicare HMO | Admitting: Cardiology

## 2022-04-04 ENCOUNTER — Other Ambulatory Visit: Payer: Self-pay

## 2022-04-04 ENCOUNTER — Inpatient Hospital Stay: Payer: Medicare HMO

## 2022-04-04 DIAGNOSIS — M7989 Other specified soft tissue disorders: Secondary | ICD-10-CM | POA: Diagnosis not present

## 2022-04-04 DIAGNOSIS — E1122 Type 2 diabetes mellitus with diabetic chronic kidney disease: Secondary | ICD-10-CM | POA: Diagnosis not present

## 2022-04-04 DIAGNOSIS — Z952 Presence of prosthetic heart valve: Secondary | ICD-10-CM

## 2022-04-04 DIAGNOSIS — Z7901 Long term (current) use of anticoagulants: Secondary | ICD-10-CM | POA: Diagnosis not present

## 2022-04-04 DIAGNOSIS — D594 Other nonautoimmune hemolytic anemias: Secondary | ICD-10-CM

## 2022-04-04 DIAGNOSIS — E1165 Type 2 diabetes mellitus with hyperglycemia: Secondary | ICD-10-CM | POA: Diagnosis not present

## 2022-04-04 DIAGNOSIS — I7782 Antineutrophilic cytoplasmic antibody (ANCA) vasculitis: Secondary | ICD-10-CM | POA: Diagnosis not present

## 2022-04-04 DIAGNOSIS — M8580 Other specified disorders of bone density and structure, unspecified site: Secondary | ICD-10-CM | POA: Diagnosis not present

## 2022-04-04 DIAGNOSIS — D591 Autoimmune hemolytic anemia, unspecified: Secondary | ICD-10-CM | POA: Diagnosis not present

## 2022-04-04 DIAGNOSIS — N184 Chronic kidney disease, stage 4 (severe): Secondary | ICD-10-CM | POA: Diagnosis not present

## 2022-04-04 DIAGNOSIS — I130 Hypertensive heart and chronic kidney disease with heart failure and stage 1 through stage 4 chronic kidney disease, or unspecified chronic kidney disease: Secondary | ICD-10-CM | POA: Diagnosis not present

## 2022-04-04 DIAGNOSIS — Z794 Long term (current) use of insulin: Secondary | ICD-10-CM | POA: Diagnosis not present

## 2022-04-04 LAB — CBC WITH DIFFERENTIAL/PLATELET
Abs Immature Granulocytes: 0.05 10*3/uL (ref 0.00–0.07)
Basophils Absolute: 0 10*3/uL (ref 0.0–0.1)
Basophils Relative: 0 %
Eosinophils Absolute: 0 10*3/uL (ref 0.0–0.5)
Eosinophils Relative: 0 %
HCT: 25.3 % — ABNORMAL LOW (ref 36.0–46.0)
Hemoglobin: 8.7 g/dL — ABNORMAL LOW (ref 12.0–15.0)
Immature Granulocytes: 1 %
Lymphocytes Relative: 4 %
Lymphs Abs: 0.3 10*3/uL — ABNORMAL LOW (ref 0.7–4.0)
MCH: 31.6 pg (ref 26.0–34.0)
MCHC: 34.4 g/dL (ref 30.0–36.0)
MCV: 92 fL (ref 80.0–100.0)
Monocytes Absolute: 0.1 10*3/uL (ref 0.1–1.0)
Monocytes Relative: 2 %
Neutro Abs: 6.7 10*3/uL (ref 1.7–7.7)
Neutrophils Relative %: 93 %
Platelets: 118 10*3/uL — ABNORMAL LOW (ref 150–400)
RBC: 2.75 MIL/uL — ABNORMAL LOW (ref 3.87–5.11)
RDW: 14.8 % (ref 11.5–15.5)
WBC: 7.2 10*3/uL (ref 4.0–10.5)
nRBC: 0 % (ref 0.0–0.2)

## 2022-04-04 LAB — SAMPLE TO BLOOD BANK

## 2022-04-04 LAB — PROTIME-INR
INR: 2.5 — ABNORMAL HIGH (ref 0.8–1.2)
Prothrombin Time: 27.2 seconds — ABNORMAL HIGH (ref 11.4–15.2)

## 2022-04-04 LAB — CMP (CANCER CENTER ONLY)
ALT: 15 U/L (ref 0–44)
AST: 21 U/L (ref 15–41)
Albumin: 3.5 g/dL (ref 3.5–5.0)
Alkaline Phosphatase: 50 U/L (ref 38–126)
Anion gap: 9 (ref 5–15)
BUN: 100 mg/dL — ABNORMAL HIGH (ref 8–23)
CO2: 21 mmol/L — ABNORMAL LOW (ref 22–32)
Calcium: 8.6 mg/dL — ABNORMAL LOW (ref 8.9–10.3)
Chloride: 99 mmol/L (ref 98–111)
Creatinine: 2.55 mg/dL — ABNORMAL HIGH (ref 0.44–1.00)
GFR, Estimated: 18 mL/min — ABNORMAL LOW (ref >60)
Glucose, Bld: 284 mg/dL — ABNORMAL HIGH (ref 70–99)
Potassium: 4.8 mmol/L (ref 3.5–5.1)
Sodium: 129 mmol/L — ABNORMAL LOW (ref 135–145)
Total Bilirubin: 0.6 mg/dL (ref 0.3–1.2)
Total Protein: 5.4 g/dL — ABNORMAL LOW (ref 6.5–8.1)

## 2022-04-04 LAB — RETIC PANEL
Immature Retic Fract: 4.7 % (ref 2.3–15.9)
RBC.: 2.77 MIL/uL — ABNORMAL LOW (ref 3.87–5.11)
Retic Count, Absolute: 78.4 10*3/uL (ref 19.0–186.0)
Retic Ct Pct: 2.8 % (ref 0.4–3.1)
Reticulocyte Hemoglobin: 32.8 pg (ref >27.9)

## 2022-04-06 ENCOUNTER — Other Ambulatory Visit: Payer: Self-pay

## 2022-04-06 ENCOUNTER — Other Ambulatory Visit (HOSPITAL_COMMUNITY): Payer: Self-pay

## 2022-04-06 NOTE — Patient Outreach (Signed)
[facility]) Care Management  04/06/2022  [name] 06/06/1941 [id number]   Patient doing ok. Appointment on 04/07/22 for follow up anemia.  Heart failure and diabetes management continues.  No concerns.  Care Plan : RN CM Plan of Care  Updates made by Jon Billings, RN since 04/06/2022 12:00 AM     Problem: Chronic disease management and care coordination needs of HF, DM, and Anemia   Priority: High     Long-Range Goal: Development of plan of care for management of HF and DM   Start Date: 01/13/2022  Expected End Date: 10/08/2022  This Visit's Progress: On track  Recent Progress: On track  Priority: High  Note:   Current Barriers:  Chronic Disease Management support and education needs related to CHF and DMII   RNCM Clinical Goal(s):  Patient will verbalize basic understanding of  CHF and DMII disease process and self health management plan as evidenced by No acute exacerbation of heart failure and blood sugars less than 300 continue to work with [place] to address care management and care coordination needs related to  CHF and DMII as evidenced by adherence to CM Team Scheduled appointments through collaboration with RN Care manager, provider, and care team.   Interventions: Education and support related to heart failure and diabetes Inter-disciplinary care team collaboration (see longitudinal plan of care) Evaluation of current treatment plan related to  self management and patient's adherence to plan as established by provider   Heart Failure Interventions:  (Status:  Goal on track:  Yes.) Long Term Goal Basic overview and discussion of pathophysiology of Heart Failure reviewed Provided education on low sodium diet Discussed importance of daily weight and advised patient to weigh and record daily  Diabetes Interventions:  (Status:  Goal on track:  Yes.) Long Term Goal Assessed patient's understanding of A1c goal: <7% Provided education to  patient about basic DM disease process Reviewed medications with patient and discussed importance of medication adherence Discussed plans with patient for ongoing care management follow up and provided patient with direct contact information for care management team Discussed importance of monitoring carbohydrates and sugars.  Lab Results  Component Value Date   HGBA1C 4.6 (L) 02/24/2019  01/13/22 Patient having some problems with her anemia.  She is on prednisone and has had a blood transfusion.  Patient reports she is now on tresiba for her sugars 14 units daily.  Patient reports blood sugar however this morning was 253.  She is concerned with highs and will be calling physician to reports sugars and next steps.   Discussed importance of tight carbohydrate control to help with her sugars.  Weight up to 131 lbs. However, due to patient on prednisone explains weight gain.  CM reiterated signs of heart failure.    01/31/22 Patient continues to deal with her anemia.  However, prednisone has started to be tapered. Patient working with pharmacist to adjust [name] accordingly.  Patient reports highest sugar was 369 in the last 7-8 days.  Discussed affects of prednisone on sugars.  Patient weight today 127 lbs.  Patient continues with heart failure management such as no salt, daily weights, and watching for swelling.    02/13/22 Patient continues with diabetes management.  Blood sugar continues to be up and down due to prednisone.  Discussed tight carbohydrate control.  Patient weight up since hospitalization due to blood transfusion and IV fluids.  Most recent weight 138 lbs.  Patient reports some swelling to legs.  Encouraged to limit salt intake and continue daily weights.    02/28/22 Patient continues on prednisone causing her sugars to be irradict.  Patient on sliding scale now.  Discussed importance of continued diabetes management.  Patient weight at 137 lbs.  Heart failure management continues.     03/20/22 Patient blood sugars continue to be up and down due to prednisone use.  Patient blood sugar this am.  Patient weight 138 lbs.    04/06/22 Patient managing.  Blood sugars are less than 300.  Weight around 138 lbs.  Disease management reiterated.  Patient Goals/Self-Care Activities: Heart Failure and Diabetes Take all medications as prescribed Attend all scheduled provider appointments call office if I gain more than 2 pounds in one day or 5 pounds in one week track weight in diary use salt in moderation watch for swelling in feet, ankles and legs every day weigh myself daily follow rescue plan if symptoms flare-up check blood sugar at prescribed times: twice daily enter blood sugar readings and medication or insulin into daily log take the blood sugar log to all doctor visits manage portion size Limit food high in carbohydrates such as rice, bread, potatoes, pasta  Follow Up Plan:  Telephone follow up appointment with care management team member scheduled for:  July The patient has been provided with contact information for the care management team and has been advised to call with any health related questions or concerns.      Long-Range Goal: Development of plan of care for management of anemia   Start Date: 02/13/2022  Expected End Date: 10/08/2022  This Visit's Progress: On track  Recent Progress: On track  Priority: High  Note:   Current Barriers:  Chronic Disease Management support and education needs related to Anemia   RNCM Clinical Goal(s):  Patient will verbalize basic understanding of  Anemia disease process and self health management plan as evidenced by hemoglobin staying above 7.0  through collaboration with RN Care manager, provider, and care team.   Interventions: Education and support related to anemia Inter-disciplinary care team collaboration (see longitudinal plan of care) Evaluation of current treatment plan related to  self management and  patient's adherence to plan as established by provider   Anemia  (Status:  Goal on track:  Yes.)  Long Term Goal Evaluation of current treatment plan related to  Anemia ,  self-management and patient's adherence to plan as established by provider. Discussed plans with patient for ongoing care management follow up and provided patient with direct contact information for care management team Evaluation of current treatment plan related to anemia and patient's adherence to plan as established by provider Provided education to patient re: Iron rich foods and importance of follow up with physician Discussed plans with patient for ongoing care management follow up and provided patient with direct contact information for care management team  Patient Goals/Self-Care Activities: Anemia Take all medications as prescribed Attend all scheduled provider appointments Notify physician for any changes in condition such as shortness of breath and increased fatigue.   Pace self with activities Eat iron rich foods such as red meat, salmon, breakfast cereal with iron.    02/28/22 Patient reports doing very well.  Pleased with blood work on yesterday hemoglobin 9.2 and no transfusion needed.  Encouraged to eat protein and iron rich foods as she can.  Remains on prednisone but 15 mg this week per patient.  No concerns.    03/20/22 Patient continues to battle with anemia. She had  a blood transfusion on Friday.  Patient reports feeling some better after.  Patient remains on prednisone at 20 mg.  Reiterated importance of iron rich foods.    04/06/22 Patient continues to manage with anemia.  Last Hemoglobin 8.7.  Appointment on 04/07/22.  No concerns.    Follow Up Plan:  Telephone follow up appointment with care management team member scheduled for:  July The patient has been provided with contact information for the care management team and has been advised to call with any health related questions or concerns.       Plan: Follow-up: Patient agrees to Care Plan and Follow-up. Follow-up in July.   [name], RN, MSN [facility] Care Management Care Management Coordinator Direct Line [phone] Toll Free: [phone]  Fax: [phone]

## 2022-04-06 NOTE — Patient Instructions (Addendum)
Patient Goals/Self-Care Activities: Heart Failure and Diabetes Take all medications as prescribed Attend all scheduled provider appointments call office if I gain more than 2 pounds in one day or 5 pounds in one week track weight in diary use salt in moderation watch for swelling in feet, ankles and legs every day weigh myself daily follow rescue plan if symptoms flare-up check blood sugar at prescribed times: twice daily enter blood sugar readings and medication or insulin into daily log take the blood sugar log to all doctor visits manage portion size Limit food high in carbohydrates such as rice, bread, potatoes, pasta  Patient Goals/Self-Care Activities: Anemia Take all medications as prescribed Attend all scheduled provider appointments Notify physician for any changes in condition such as shortness of breath and increased fatigue.   Pace self with activities Eat iron rich foods such as red meat, salmon, breakfast cereal with iron.

## 2022-04-07 ENCOUNTER — Inpatient Hospital Stay: Payer: Medicare HMO

## 2022-04-07 ENCOUNTER — Encounter: Payer: Self-pay | Admitting: Hematology and Oncology

## 2022-04-07 ENCOUNTER — Other Ambulatory Visit: Payer: Self-pay

## 2022-04-07 ENCOUNTER — Other Ambulatory Visit (HOSPITAL_COMMUNITY): Payer: Self-pay

## 2022-04-07 ENCOUNTER — Inpatient Hospital Stay (HOSPITAL_BASED_OUTPATIENT_CLINIC_OR_DEPARTMENT_OTHER): Payer: Medicare HMO | Admitting: Hematology and Oncology

## 2022-04-07 VITALS — BP 125/43 | HR 69 | Temp 98.1°F | Resp 16 | Ht 59.0 in | Wt 140.9 lb

## 2022-04-07 DIAGNOSIS — D591 Autoimmune hemolytic anemia, unspecified: Secondary | ICD-10-CM

## 2022-04-07 DIAGNOSIS — I130 Hypertensive heart and chronic kidney disease with heart failure and stage 1 through stage 4 chronic kidney disease, or unspecified chronic kidney disease: Secondary | ICD-10-CM | POA: Diagnosis not present

## 2022-04-07 DIAGNOSIS — Z794 Long term (current) use of insulin: Secondary | ICD-10-CM | POA: Diagnosis not present

## 2022-04-07 DIAGNOSIS — E11319 Type 2 diabetes mellitus with unspecified diabetic retinopathy without macular edema: Secondary | ICD-10-CM | POA: Diagnosis not present

## 2022-04-07 DIAGNOSIS — E7800 Pure hypercholesterolemia, unspecified: Secondary | ICD-10-CM | POA: Diagnosis not present

## 2022-04-07 DIAGNOSIS — I7782 Antineutrophilic cytoplasmic antibody (ANCA) vasculitis: Secondary | ICD-10-CM | POA: Diagnosis not present

## 2022-04-07 DIAGNOSIS — N184 Chronic kidney disease, stage 4 (severe): Secondary | ICD-10-CM | POA: Diagnosis not present

## 2022-04-07 DIAGNOSIS — I5032 Chronic diastolic (congestive) heart failure: Secondary | ICD-10-CM | POA: Diagnosis not present

## 2022-04-07 DIAGNOSIS — M7989 Other specified soft tissue disorders: Secondary | ICD-10-CM | POA: Diagnosis not present

## 2022-04-07 DIAGNOSIS — E1165 Type 2 diabetes mellitus with hyperglycemia: Secondary | ICD-10-CM | POA: Diagnosis not present

## 2022-04-07 DIAGNOSIS — M8580 Other specified disorders of bone density and structure, unspecified site: Secondary | ICD-10-CM | POA: Diagnosis not present

## 2022-04-07 DIAGNOSIS — E1122 Type 2 diabetes mellitus with diabetic chronic kidney disease: Secondary | ICD-10-CM | POA: Diagnosis not present

## 2022-04-07 LAB — CMP (CANCER CENTER ONLY)
ALT: 15 U/L (ref 0–44)
AST: 21 U/L (ref 15–41)
Albumin: 3.5 g/dL (ref 3.5–5.0)
Alkaline Phosphatase: 50 U/L (ref 38–126)
Anion gap: 10 (ref 5–15)
BUN: 108 mg/dL — ABNORMAL HIGH (ref 8–23)
CO2: 21 mmol/L — ABNORMAL LOW (ref 22–32)
Calcium: 8.6 mg/dL — ABNORMAL LOW (ref 8.9–10.3)
Chloride: 101 mmol/L (ref 98–111)
Creatinine: 2.48 mg/dL — ABNORMAL HIGH (ref 0.44–1.00)
GFR, Estimated: 19 mL/min — ABNORMAL LOW (ref >60)
Glucose, Bld: 204 mg/dL — ABNORMAL HIGH (ref 70–99)
Potassium: 4.7 mmol/L (ref 3.5–5.1)
Sodium: 132 mmol/L — ABNORMAL LOW (ref 135–145)
Total Bilirubin: 0.5 mg/dL (ref 0.3–1.2)
Total Protein: 5.6 g/dL — ABNORMAL LOW (ref 6.5–8.1)

## 2022-04-07 LAB — CBC WITH DIFFERENTIAL/PLATELET
Abs Immature Granulocytes: 0.05 10*3/uL (ref 0.00–0.07)
Basophils Absolute: 0 10*3/uL (ref 0.0–0.1)
Basophils Relative: 0 %
Eosinophils Absolute: 0 10*3/uL (ref 0.0–0.5)
Eosinophils Relative: 0 %
HCT: 24.6 % — ABNORMAL LOW (ref 36.0–46.0)
Hemoglobin: 8.3 g/dL — ABNORMAL LOW (ref 12.0–15.0)
Immature Granulocytes: 1 %
Lymphocytes Relative: 3 %
Lymphs Abs: 0.2 10*3/uL — ABNORMAL LOW (ref 0.7–4.0)
MCH: 31.3 pg (ref 26.0–34.0)
MCHC: 33.7 g/dL (ref 30.0–36.0)
MCV: 92.8 fL (ref 80.0–100.0)
Monocytes Absolute: 0.2 10*3/uL (ref 0.1–1.0)
Monocytes Relative: 2 %
Neutro Abs: 8.1 10*3/uL — ABNORMAL HIGH (ref 1.7–7.7)
Neutrophils Relative %: 94 %
Platelets: 108 10*3/uL — ABNORMAL LOW (ref 150–400)
RBC: 2.65 MIL/uL — ABNORMAL LOW (ref 3.87–5.11)
RDW: 14.9 % (ref 11.5–15.5)
WBC: 8.6 10*3/uL (ref 4.0–10.5)
nRBC: 0 % (ref 0.0–0.2)

## 2022-04-07 LAB — RETIC PANEL
Immature Retic Fract: 8.2 % (ref 2.3–15.9)
RBC.: 2.68 MIL/uL — ABNORMAL LOW (ref 3.87–5.11)
Retic Count, Absolute: 86.8 10*3/uL (ref 19.0–186.0)
Retic Ct Pct: 3.2 % — ABNORMAL HIGH (ref 0.4–3.1)
Reticulocyte Hemoglobin: 33.7 pg (ref >27.9)

## 2022-04-07 MED ORDER — PANTOPRAZOLE SODIUM 40 MG PO TBEC: 40.0000 mg | DELAYED_RELEASE_TABLET | Freq: Every day | ORAL | 1 refills | Status: DC

## 2022-04-07 NOTE — Progress Notes (Signed)
[facility] Follow up:    [name], [name], MD [place] [address]  DIAGNOSIS: Autoimmune hemolytic anemia  SUMMARY OF HEMATOLOGIC HISTORY: [name] is a 81 y.o. [place] woman with multifactorial anemia, as follows:             (a) anemia of chronic inflammation: As of May 2020 she has a SED rate of 95, CRP 1.1, positive ANA and RF; subsequently she was found to be ANCA positive             (b) hemolysis (mild): she has a mildly elevated LDH and a DAT positive for complement, not IgG; this is c/w (a) above             (c) anemia of renal insufficiency: inadequate EPO resonse to anemia                         (d) GIB/ iron deficiency in patient on lifelong anticoagulaton   (1) Feraheme received 03/01/2019, repeated 03/20/2019              (a) reticulocyte 03/07/2019 up from 43.4 a year ago to 191.8 after iron infusion             (b) feraheme repeated on 05/12/2019             (c) subsequent ferritin levels >100   (2) darbepoetin: starting 05/05/2019 at 176mg given every 2 weeks             (a) dose increased to 200 mcg every week starting 07/01/2019              (b) back to every 2 weeks beginning 08/04/2019 still at 200 mcg dose             (c) switched to Retacrit every 4 weeks as of 08/18/2019   (3) ANCA positive vasculitis: diagnosed 05/08/2019 (titer 1:640)             (a) prednisone 40 mg daily started 05/31/2019             (b) rituximab weekly started 06/12/2019 (received test dose 06/05/2019)                         (i) hepatitis B sAg, core Ab and HIV negative 05/08/2019                         (ii) rituximab held after 07/14/2019 dose (received 5 weekly doses) with neutropenia                         (iii) rituximab resumed 08/18/2019 but changed to monthly                         (iv) rituximab changed to every 2 months beginning with 02/02/2020 dose                         (v) rituximab changed to every 12 weeks after the  10/12/2020 dose             (c) prednisone tapered to off as of 09/15/2019   (4) osteopenia with a T score of -1.5 on bone density 02/28/2016 at the [facility] center  CURRENT THERAPY: Prednisone and MMF  INTERVAL HISTORY:  [name]  81 y.o. female returns for her follow-up of anemia. She is here with her daughter to the appointment.  She tells me that she does not feel well.  Her legs are very swollen, she feels nauseous in the mornings.  She is getting tired of having to go through all of this and is wondering if this is even worth all her effort.  She does have an appointment with [facility] but this is scheduled end of September and she was hoping if this can be expedited.  She is currently on prednisone 15 mg daily and MMF 1000 mg daily.  No fevers or chills.  No bleeding complaints. Rest of the pertinent 10 point ROS reviewed and negative.   Patient Active Problem List   Diagnosis Date Noted   Hyperkalemia 02/04/2022   Acute renal failure superimposed on stage 3b chronic kidney disease ([place]) 02/04/2022   Hyperglycemia 01/09/2022   Autoimmune hemolytic anemia (HCC) 01/02/2022   Bilateral lower extremity edema 01/02/2022   Refractory anemia ([place]) 12/15/2021   Abnormal chest x-ray 12/15/2021   Supratherapeutic INR 12/15/2021   Elevated troponin 12/15/2021   Unilateral primary osteoarthritis, right hip 05/31/2021   Midline cystocele 05/30/2021   Primary localized osteoarthritis of pelvic region and thigh 05/30/2021   Vaginal pain 05/30/2021   Asymmetric SNHL (sensorineural hearing loss) 01/06/2021   Referred otalgia of both ears 01/06/2021   Bilateral hearing loss 08/31/2020   Low back pain 02/20/2020   CKD (chronic kidney disease), stage IV ([place]) 10/13/2019   Leukopenia due to antineoplastic chemotherapy ([place]) 08/18/2019   Thrombophilia ([place]) 06/19/2019   Encounter for general adult medical examination without abnormal findings 06/13/2019   Arteritis ([place]) 06/06/2019   Hypertensive  heart and chronic kidney disease with heart failure and stage 1 through stage 4 chronic kidney disease, or unspecified chronic kidney disease ([place]) 06/06/2019   Muscle weakness 06/06/2019   Pancytopenia ([place]) 05/28/2019   Dyspnea 05/28/2019   Vasculitis, ANCA positive 05/28/2019   Disorder of connective tissue ([place]) 05/14/2019   Renal insufficiency 05/08/2019   Acute on chronic diastolic CHF (congestive heart failure) ([place]) 04/07/2019   Left leg cellulitis 04/07/2019   PAF (paroxysmal atrial fibrillation) ([place]) 04/07/2019   Iron deficiency anemia 02/28/2019   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 ([place]) 02/28/2019   Diabetic retinopathy associated with type 2 diabetes mellitus ([place]) 09/02/2018   Chronic pain 06/19/2018   Non-thrombocytopenic purpura ([place]) 05/21/2018   Gout 03/22/2018   Malnutrition of moderate degree 03/12/2018   Diabetes mellitus type 2, controlled ([place]) 03/06/2018   S/P cholecystectomy 03/06/2018   HTN (hypertension) 03/06/2018   H/O mechanical aortic valve replacement 03/06/2018   Moderate to severe pulmonary hypertension ([place]) 03/06/2018   GERD (gastroesophageal reflux disease) 02/21/2018   Anxiety 02/21/2018   Chronic diastolic CHF (congestive heart failure) ([place]) 02/21/2018   Diarrhea 02/21/2018   General weakness 02/02/2018   Thrombocytopenia ([place]) 01/25/2018   Arm pain, diffuse 01/25/2018   Anemia 01/23/2018   Aortic atherosclerosis ([place]) 01/23/2018   GI bleed 01/18/2018   Age-related osteoporosis without current pathological fracture 05/24/2017   Carotid artery occlusion 05/24/2017   Generalized anxiety disorder 05/24/2017   Hearing loss 05/24/2017   Presence of prosthetic heart valve 05/24/2017   Carotid artery disease ([place]) 08/18/2013   Peripheral arterial disease ([place]) 08/18/2013   Diabetes mellitus without complication ([place]) 62/56/3893   Hyponatremia 10/06/2011   Hypertension    Hypercholesteremia    Mechanical heart  valve present    Chronic anticoagulation  is allergic to flagyl [metronidazole], ciprofloxacin, coreg [carvedilol], diflucan [fluconazole], losartan, privigen [immune globulin (human)], sulfamethoxazole-trimethoprim, verapamil, zetia [ezetimibe], zocor [simvastatin - high dose], and gatifloxacin.  MEDICAL HISTORY: Past Medical History:  Diagnosis Date   Aortic atherosclerosis ([place]) 01/23/2018   Atrial fibrillation (HCC)    Benign positional vertigo 10/06/2011   Carotid artery disease ([place])    Chemotherapy induced neutropenia ([place]) 07/21/2019   Cholelithiasis 01/23/2018   Cholelithiasis 01/23/2018   Chronic anticoagulation    Coronary artery disease    status post coronary artery bypass grafting times 07/10/2004   Diabetes mellitus    Hypercholesteremia    Hypertension    Mechanical heart valve present    H. aortic valve replacement at the time of bypass surgery October 2005   Moderate to severe pulmonary hypertension ([place])    Peripheral arterial disease ([place])    history of left common iliac artery PTA and stenting for a chronic total occlusion 08/26/01    SURGICAL HISTORY: Past Surgical History:  Procedure Laterality Date   anterior repair  2009   AORTIC VALVE REPLACEMENT  2005   Dr. Cyndia Bent   CARDIAC CATHETERIZATION  11/10/2004   40% right common illiac, 70% in stent restenosis of distal left common illiac,    CARDIAC CATHETERIZATION  05/18/2004   LAD 50-70% midstenosis, RCA dominant w/50% stenosis, 50% Right common Illiac artery ostial stenosis, 90% in stent restenosis within midportion of left common illiac stent   Carotid Duplex  03/12/2012   RSA-elev. velocities suggestive of a 50-69% diameter reduction, Right&Left Bulb/Prox ICA-mild-mod.fibrous plaqueelevating Velocities abnormal study.   CHOLECYSTECTOMY N/A 03/01/2018   Procedure: LAPAROSCOPIC CHOLECYSTECTOMY;  Surgeon: Judeth Horn, MD;  Location: [facility];  Service: General;  Laterality: N/A;   COLONOSCOPY WITH PROPOFOL  N/A 01/22/2018   Procedure: COLONOSCOPY WITH PROPOFOL;  Surgeon: Wilford Corner, MD;  Location: [facility];  Service: Endoscopy;  Laterality: N/A;   ESOPHAGOGASTRODUODENOSCOPY (EGD) WITH PROPOFOL N/A 01/22/2018   Procedure: ESOPHAGOGASTRODUODENOSCOPY (EGD) WITH PROPOFOL;  Surgeon: Wilford Corner, MD;  Location: [facility];  Service: Endoscopy;  Laterality: N/A;   ESOPHAGOGASTRODUODENOSCOPY (EGD) WITH PROPOFOL N/A 11/21/2018   Procedure: ESOPHAGOGASTRODUODENOSCOPY (EGD) WITH PROPOFOL;  Surgeon: [name], MD;  Location: [facility];  Service: Gastroenterology;  Laterality: N/A;   ESOPHAGOGASTRODUODENOSCOPY (EGD) WITH PROPOFOL Left 02/27/2019   Procedure: ESOPHAGOGASTRODUODENOSCOPY (EGD) WITH PROPOFOL;  Surgeon: Arta Silence, MD;  Location: [facility];  Service: Endoscopy;  Laterality: Left;   GIVENS CAPSULE STUDY N/A 11/21/2018   Procedure: GIVENS CAPSULE STUDY;  Surgeon: [name], MD;  Location: [facility];  Service: Gastroenterology;  Laterality: N/A;  To be deployed during EGD   Lower Ext. Duplex  03/12/2012   Right Proximal CIA- vessel narrowing w/elevated velocities 0-49% diameter reduction. Right SFA-mild mixed density plaque throughout vessel.   NM MYOCAR PERF WALL MOTION  05/19/2010   protocol: Persantine, post stress EF 65%, negative for ischemia, low risk scan   RIGHT HEART CATH N/A 06/27/2018   Procedure: RIGHT HEART CATH;  Surgeon: [name], MD;  Location: [facility];  Service: Cardiovascular;  Laterality: N/A;   TOTAL ABDOMINAL HYSTERECTOMY W/ BILATERAL SALPINGOOPHORECTOMY  1989   TRANSTHORACIC ECHOCARDIOGRAM  08/29/2012   Moderately calcified annulus of mitral valve, moderate regurg. of both mitral valve and tricuspid valve.     SOCIAL HISTORY: Social History   Socioeconomic History   Marital status: Widowed    Spouse name: Not on file   Number of children: Not on file   Years of education: Not  on file   Highest education level: Not on file   Occupational History   Occupation: Retired  Tobacco Use   Smoking status: Former    Packs/day: 1.00    Years: 30.00    Total pack years: 30.00    Types: Cigarettes   Smokeless tobacco: Never   Tobacco comments:    quit smoking 2005  Vaping Use   Vaping Use: Never used  Substance and Sexual Activity   Alcohol use: No    Alcohol/week: 0.0 standard drinks of alcohol   Drug use: No   Sexual activity: Not Currently    Birth control/protection: None  Other Topics Concern   Not on file  Social History Narrative   Not on file   Social Determinants of Health   Financial Resource Strain: Low Risk  (02/28/2022)   Overall Financial Resource Strain (CARDIA)    Difficulty of Paying Living Expenses: Not hard at all  Food Insecurity: No Food Insecurity (01/13/2022)   Hunger Vital Sign    Worried About Running Out of Food in the Last Year: Never true    [place] in the Last Year: Never true  Transportation Needs: No Transportation Needs (07/13/2021)   PRAPARE - Hydrologist (Medical): No    Lack of Transportation (Non-Medical): No  Physical Activity: Inactive (02/28/2022)   Exercise Vital Sign    Days of Exercise per Week: 0 days    Minutes of Exercise per Session: 0 min  Stress: No Stress Concern Present (02/28/2022)   [place]    Feeling of Stress : Only a little  Social Connections: Socially Isolated (11/25/2019)   Social Connection and Isolation Panel [NHANES]    Frequency of Communication with Friends and Family: More than three times a week    Frequency of Social Gatherings with Friends and Family: Once a week    Attends Religious Services: Never    Marine scientist or Organizations: No    Attends Archivist Meetings: Never    Marital Status: Widowed  Intimate Partner Violence: Not At Risk (02/28/2022)   Humiliation, Afraid, Rape, and Kick questionnaire    Fear  of Current or Ex-Partner: No    Emotionally Abused: No    Physically Abused: No    Sexually Abused: No    FAMILY HISTORY: Family History  Problem Relation Age of Onset   Heart disease Father    Hypertension Father    Hyperlipidemia Father    Breast cancer Neg Hx     Review of Systems  Constitutional:  Positive for fatigue. Negative for appetite change, chills, fever and unexpected weight change.  HENT:   Negative for hearing loss, lump/mass and trouble swallowing.   Eyes:  Negative for eye problems and icterus.  Respiratory:  Positive for shortness of breath. Negative for chest tightness and cough.   Cardiovascular:  Positive for leg swelling. Negative for chest pain and palpitations.  Gastrointestinal:  Negative for abdominal distention, abdominal pain, constipation, diarrhea, nausea and vomiting.  Endocrine: Negative for hot flashes.  Genitourinary:  Negative for difficulty urinating.   Musculoskeletal:  Negative for arthralgias.  Skin:  Negative for itching and rash.  Neurological:  Negative for dizziness, extremity weakness, headaches and numbness.  Hematological:  Negative for adenopathy. Does not bruise/bleed easily.  Psychiatric/Behavioral:  Negative for depression. The patient is not nervous/anxious.       PHYSICAL EXAMINATION  ECOG PERFORMANCE STATUS:  1 - Symptomatic but completely ambulatory  Vitals:   04/07/22 1153  BP: (!) 125/43  Pulse: 69  Resp: 16  Temp: 98.1 F (36.7 C)  SpO2: 98%    Physical Exam Constitutional:      General: She is not in acute distress.    Appearance: Normal appearance. She is not toxic-appearing.     Comments: She is in a wheelchair  HENT:     Head: Normocephalic and atraumatic.  Eyes:     General: No scleral icterus. Cardiovascular:     Rate and Rhythm: Normal rate and regular rhythm.     Pulses: Normal pulses.     Heart sounds: Normal heart sounds.  Pulmonary:     Effort: Pulmonary effort is normal.     Breath sounds:  Normal breath sounds.  Abdominal:     General: Abdomen is flat. Bowel sounds are normal. There is no distension.     Palpations: Abdomen is soft.     Tenderness: There is no abdominal tenderness. There is no guarding.  Musculoskeletal:        General: Swelling (She has her legs wrapped in Ace bandages today.  Bilateral lower extremity swelling improving) present.     Cervical back: Neck supple.  Lymphadenopathy:     Cervical: No cervical adenopathy.  Skin:    General: Skin is warm and dry.     Findings: No rash.  Neurological:     General: No focal deficit present.     Mental Status: She is alert.  Psychiatric:        Mood and Affect: Mood normal.        Behavior: Behavior normal.     LABORATORY DATA:  CBC    Component Value Date/Time   WBC 8.6 04/07/2022 1127   RBC 2.65 (L) 04/07/2022 1127   RBC 2.68 (L) 04/07/2022 1127   HGB 8.3 (L) 04/07/2022 1127   HGB 10.2 (L) 03/24/2022 1008   HCT 24.6 (L) 04/07/2022 1127   HCT 28.5 (L) 11/20/2018 1824   PLT 108 (L) 04/07/2022 1127   PLT 122 (L) 03/24/2022 1008   MCV 92.8 04/07/2022 1127   MCH 31.3 04/07/2022 1127   MCHC 33.7 04/07/2022 1127   RDW 14.9 04/07/2022 1127   LYMPHSABS 0.2 (L) 04/07/2022 1127   MONOABS 0.2 04/07/2022 1127   EOSABS 0.0 04/07/2022 1127   BASOSABS 0.0 04/07/2022 1127    CMP     Component Value Date/Time   NA 132 (L) 04/07/2022 1127   NA 137 12/06/2017 1436   K 4.7 04/07/2022 1127   CL 101 04/07/2022 1127   CO2 21 (L) 04/07/2022 1127   GLUCOSE 204 (H) 04/07/2022 1127   BUN 108 (H) 04/07/2022 1127   BUN 20 12/06/2017 1436   CREATININE 2.48 (H) 04/07/2022 1127   CALCIUM 8.6 (L) 04/07/2022 1127   PROT 5.6 (L) 04/07/2022 1127   ALBUMIN 3.5 04/07/2022 1127   AST 21 04/07/2022 1127   ALT 15 04/07/2022 1127   ALKPHOS 50 04/07/2022 1127   BILITOT 0.5 04/07/2022 1127   GFRNONAA 19 (L) 04/07/2022 1127   GFRAA 32 (L) 03/30/2020 0932     ASSESSMENT and THERAPY PLAN:  [name] is a 81 y.o.  female who presents for multifactorial anemia, previously treated with rituximab for ANCA positive hemolytic anemia, previously on maintenance rituximab every 12 weeks presented with worsening anemia thought to be related to her hemolysis.  She was diagnosed with elevated ANCA titers 1 is to  640 and previously treated for ANCA positive hemolytic anemia, seen by rheumatology Dr. Salome Holmes back in 2020, responded at that time to prednisone and rituximab and continued on rituximab maintenance.  However her rituximab was changed to every 29-monthinfusions and she relapsed before her second cycle of every 367-monthituximab.   # Multifactorial Anemia:  --Bone marrow biopsy from 02/20/2022 showed normocellular marrow with trilineage hematopoiesis. No leukemia, lymphoma or high-grade dysplasia identified. --CT CAP from 02/20/2022 did not show any underlying cause for anemia.  --ANCA titers are normal when checked on 02/10/2022 --Currently on prednisone 15 mg p.o. daily and mycophenolate mofetil to 1000 mg PO daily.   --We have attempted 1 more prednisone taper and she was on 15 mg last week since we uptitrated the mycophenolate mofetil.  Labs from this past week show increasing reticulocyte count and dropping hemoglobin consistent with hemolysis again.  Her hemoglobin today is 8.3, we do not plan to transfuse her today.  We will repeat labs next week and most likely she may need transfusion again.  [facility], she is currently scheduled at the end of September and she was hoping to see them sooner.  She will continue insulin and hypoglycemic monitoring per PCP. She continues to have severe bilateral lower extremity edema, not significantly different compared to her previous visits, no clear evidence of cellulitis, will continue to monitor this. I have asked her to increase the prednisone back to 20 mg, and take pantoprazole instead of omeprazole given GERD symptoms and  return to clinic in 1 week. --Consider PRBC transfusion if hemoglobin is less than adequately to 8  Patient expressed understanding of the plan provided. She is aware to reach out if she has any new questions or concerns.   I have spent a total of 30 minutes minutes of face-to-face and non-face-to-face time, preparing to see the patient, performing a medically appropriate examination, counseling and educating the patient, documenting clinical information in the electronic health record, and care coordination.

## 2022-04-10 ENCOUNTER — Other Ambulatory Visit: Payer: Self-pay | Admitting: *Deleted

## 2022-04-10 ENCOUNTER — Inpatient Hospital Stay: Payer: Medicare HMO | Attending: Oncology

## 2022-04-10 ENCOUNTER — Other Ambulatory Visit: Payer: Self-pay

## 2022-04-10 ENCOUNTER — Inpatient Hospital Stay: Payer: Medicare HMO

## 2022-04-10 ENCOUNTER — Telehealth: Payer: Self-pay | Admitting: *Deleted

## 2022-04-10 ENCOUNTER — Ambulatory Visit (INDEPENDENT_AMBULATORY_CARE_PROVIDER_SITE_OTHER): Payer: Medicare HMO | Admitting: *Deleted

## 2022-04-10 VITALS — BP 162/50 | HR 79 | Temp 97.7°F | Resp 18

## 2022-04-10 DIAGNOSIS — N3289 Other specified disorders of bladder: Secondary | ICD-10-CM | POA: Diagnosis not present

## 2022-04-10 DIAGNOSIS — N3090 Cystitis, unspecified without hematuria: Secondary | ICD-10-CM | POA: Diagnosis not present

## 2022-04-10 DIAGNOSIS — K5792 Diverticulitis of intestine, part unspecified, without perforation or abscess without bleeding: Secondary | ICD-10-CM | POA: Diagnosis not present

## 2022-04-10 DIAGNOSIS — D591 Autoimmune hemolytic anemia, unspecified: Secondary | ICD-10-CM

## 2022-04-10 DIAGNOSIS — N3946 Mixed incontinence: Secondary | ICD-10-CM | POA: Diagnosis present

## 2022-04-10 DIAGNOSIS — E1151 Type 2 diabetes mellitus with diabetic peripheral angiopathy without gangrene: Secondary | ICD-10-CM | POA: Diagnosis present

## 2022-04-10 DIAGNOSIS — D500 Iron deficiency anemia secondary to blood loss (chronic): Secondary | ICD-10-CM

## 2022-04-10 DIAGNOSIS — E1122 Type 2 diabetes mellitus with diabetic chronic kidney disease: Secondary | ICD-10-CM | POA: Diagnosis present

## 2022-04-10 DIAGNOSIS — R197 Diarrhea, unspecified: Secondary | ICD-10-CM | POA: Diagnosis not present

## 2022-04-10 DIAGNOSIS — E876 Hypokalemia: Secondary | ICD-10-CM | POA: Diagnosis not present

## 2022-04-10 DIAGNOSIS — I272 Pulmonary hypertension, unspecified: Secondary | ICD-10-CM | POA: Diagnosis present

## 2022-04-10 DIAGNOSIS — N993 Prolapse of vaginal vault after hysterectomy: Secondary | ICD-10-CM | POA: Diagnosis present

## 2022-04-10 DIAGNOSIS — K5730 Diverticulosis of large intestine without perforation or abscess without bleeding: Secondary | ICD-10-CM | POA: Diagnosis not present

## 2022-04-10 DIAGNOSIS — M8580 Other specified disorders of bone density and structure, unspecified site: Secondary | ICD-10-CM | POA: Diagnosis present

## 2022-04-10 DIAGNOSIS — N3080 Other cystitis without hematuria: Secondary | ICD-10-CM | POA: Diagnosis present

## 2022-04-10 DIAGNOSIS — D649 Anemia, unspecified: Secondary | ICD-10-CM | POA: Diagnosis not present

## 2022-04-10 DIAGNOSIS — Z79890 Hormone replacement therapy (postmenopausal): Secondary | ICD-10-CM | POA: Diagnosis not present

## 2022-04-10 DIAGNOSIS — I10 Essential (primary) hypertension: Secondary | ICD-10-CM | POA: Diagnosis not present

## 2022-04-10 DIAGNOSIS — R1084 Generalized abdominal pain: Secondary | ICD-10-CM | POA: Diagnosis not present

## 2022-04-10 DIAGNOSIS — I700 Atherosclerosis of aorta: Secondary | ICD-10-CM | POA: Diagnosis present

## 2022-04-10 DIAGNOSIS — I2510 Atherosclerotic heart disease of native coronary artery without angina pectoris: Secondary | ICD-10-CM | POA: Diagnosis present

## 2022-04-10 DIAGNOSIS — R933 Abnormal findings on diagnostic imaging of other parts of digestive tract: Secondary | ICD-10-CM | POA: Diagnosis not present

## 2022-04-10 DIAGNOSIS — N184 Chronic kidney disease, stage 4 (severe): Secondary | ICD-10-CM | POA: Diagnosis present

## 2022-04-10 DIAGNOSIS — E7800 Pure hypercholesterolemia, unspecified: Secondary | ICD-10-CM | POA: Diagnosis present

## 2022-04-10 DIAGNOSIS — Z7901 Long term (current) use of anticoagulants: Secondary | ICD-10-CM

## 2022-04-10 DIAGNOSIS — I480 Paroxysmal atrial fibrillation: Secondary | ICD-10-CM | POA: Diagnosis present

## 2022-04-10 DIAGNOSIS — I959 Hypotension, unspecified: Secondary | ICD-10-CM | POA: Diagnosis not present

## 2022-04-10 DIAGNOSIS — E119 Type 2 diabetes mellitus without complications: Secondary | ICD-10-CM | POA: Diagnosis not present

## 2022-04-10 DIAGNOSIS — R1030 Lower abdominal pain, unspecified: Secondary | ICD-10-CM | POA: Diagnosis not present

## 2022-04-10 DIAGNOSIS — N321 Vesicointestinal fistula: Secondary | ICD-10-CM | POA: Diagnosis not present

## 2022-04-10 DIAGNOSIS — Z952 Presence of prosthetic heart valve: Secondary | ICD-10-CM

## 2022-04-10 DIAGNOSIS — I130 Hypertensive heart and chronic kidney disease with heart failure and stage 1 through stage 4 chronic kidney disease, or unspecified chronic kidney disease: Secondary | ICD-10-CM | POA: Diagnosis present

## 2022-04-10 DIAGNOSIS — D696 Thrombocytopenia, unspecified: Secondary | ICD-10-CM | POA: Diagnosis present

## 2022-04-10 DIAGNOSIS — I7782 Antineutrophilic cytoplasmic antibody (ANCA) vasculitis: Secondary | ICD-10-CM | POA: Diagnosis present

## 2022-04-10 DIAGNOSIS — D594 Other nonautoimmune hemolytic anemias: Secondary | ICD-10-CM

## 2022-04-10 DIAGNOSIS — Z7983 Long term (current) use of bisphosphonates: Secondary | ICD-10-CM | POA: Diagnosis not present

## 2022-04-10 DIAGNOSIS — Z79899 Other long term (current) drug therapy: Secondary | ICD-10-CM | POA: Diagnosis not present

## 2022-04-10 DIAGNOSIS — E1165 Type 2 diabetes mellitus with hyperglycemia: Secondary | ICD-10-CM | POA: Diagnosis present

## 2022-04-10 DIAGNOSIS — K219 Gastro-esophageal reflux disease without esophagitis: Secondary | ICD-10-CM | POA: Diagnosis present

## 2022-04-10 DIAGNOSIS — I5032 Chronic diastolic (congestive) heart failure: Secondary | ICD-10-CM | POA: Diagnosis present

## 2022-04-10 LAB — CBC WITH DIFFERENTIAL/PLATELET
Abs Immature Granulocytes: 0.15 10*3/uL — ABNORMAL HIGH (ref 0.00–0.07)
Basophils Absolute: 0 10*3/uL (ref 0.0–0.1)
Basophils Relative: 0 %
Eosinophils Absolute: 0 10*3/uL (ref 0.0–0.5)
Eosinophils Relative: 0 %
HCT: 19.4 % — ABNORMAL LOW (ref 36.0–46.0)
Hemoglobin: 6.5 g/dL — CL (ref 12.0–15.0)
Immature Granulocytes: 2 %
Lymphocytes Relative: 2 %
Lymphs Abs: 0.2 10*3/uL — ABNORMAL LOW (ref 0.7–4.0)
MCH: 31.3 pg (ref 26.0–34.0)
MCHC: 33.5 g/dL (ref 30.0–36.0)
MCV: 93.3 fL (ref 80.0–100.0)
Monocytes Absolute: 0.1 10*3/uL (ref 0.1–1.0)
Monocytes Relative: 2 %
Neutro Abs: 7.4 10*3/uL (ref 1.7–7.7)
Neutrophils Relative %: 94 %
Platelets: 125 10*3/uL — ABNORMAL LOW (ref 150–400)
RBC: 2.08 MIL/uL — ABNORMAL LOW (ref 3.87–5.11)
RDW: 15.5 % (ref 11.5–15.5)
WBC: 7.9 10*3/uL (ref 4.0–10.5)
nRBC: 0 % (ref 0.0–0.2)

## 2022-04-10 LAB — CMP (CANCER CENTER ONLY)
ALT: 24 U/L (ref 0–44)
AST: 28 U/L (ref 15–41)
Albumin: 3.3 g/dL — ABNORMAL LOW (ref 3.5–5.0)
Alkaline Phosphatase: 54 U/L (ref 38–126)
Anion gap: 11 (ref 5–15)
BUN: 135 mg/dL — ABNORMAL HIGH (ref 8–23)
CO2: 19 mmol/L — ABNORMAL LOW (ref 22–32)
Calcium: 8.8 mg/dL — ABNORMAL LOW (ref 8.9–10.3)
Chloride: 101 mmol/L (ref 98–111)
Creatinine: 2.55 mg/dL — ABNORMAL HIGH (ref 0.44–1.00)
GFR, Estimated: 18 mL/min — ABNORMAL LOW (ref >60)
Glucose, Bld: 335 mg/dL — ABNORMAL HIGH (ref 70–99)
Potassium: 5.4 mmol/L — ABNORMAL HIGH (ref 3.5–5.1)
Sodium: 131 mmol/L — ABNORMAL LOW (ref 135–145)
Total Bilirubin: 0.5 mg/dL (ref 0.3–1.2)
Total Protein: 5.3 g/dL — ABNORMAL LOW (ref 6.5–8.1)

## 2022-04-10 LAB — RETIC PANEL
Immature Retic Fract: 13.7 % (ref 2.3–15.9)
RBC.: 2.04 MIL/uL — ABNORMAL LOW (ref 3.87–5.11)
Retic Count, Absolute: 112.4 10*3/uL (ref 19.0–186.0)
Retic Ct Pct: 5.5 % — ABNORMAL HIGH (ref 0.4–3.1)
Reticulocyte Hemoglobin: 33.2 pg (ref >27.9)

## 2022-04-10 LAB — SAMPLE TO BLOOD BANK

## 2022-04-10 LAB — PREPARE RBC (CROSSMATCH)

## 2022-04-10 LAB — PROTIME-INR
INR: 3.3 — ABNORMAL HIGH (ref 0.8–1.2)
Prothrombin Time: 33.3 seconds — ABNORMAL HIGH (ref 11.4–15.2)

## 2022-04-10 MED ORDER — FAMOTIDINE 20 MG PO TABS: 20.0000 mg | ORAL_TABLET | Freq: Two times a day (BID) | ORAL | 3 refills | Status: DC

## 2022-04-10 MED ORDER — FAMOTIDINE IN NACL 20-0.9 MG/50ML-% IV SOLN
20.0000 mg | Freq: Once | INTRAVENOUS | Status: AC
  Administered 2022-04-10: 20 mg via INTRAVENOUS
  Filled 2022-04-10: qty 50

## 2022-04-10 MED ORDER — SODIUM CHLORIDE 0.9% IV SOLUTION
250.0000 mL | Freq: Once | INTRAVENOUS | Status: AC
  Administered 2022-04-10: 250 mL via INTRAVENOUS

## 2022-04-10 MED ORDER — INSULIN ASPART 100 UNIT/ML IJ SOLN
6.0000 [IU] | Freq: Once | INTRAMUSCULAR | Status: AC
  Administered 2022-04-10: 6 [IU] via SUBCUTANEOUS

## 2022-04-10 MED ORDER — SODIUM CHLORIDE 0.9 % IV SOLN: INTRAVENOUS | Status: DC

## 2022-04-10 NOTE — Patient Instructions (Signed)
Description   Spoke with pt and advised pt to hold today's dose then continue 1 tablet daily except 1.5 tablets on Sundays, Wednesdays, and Fridays. Recheck INR in 1 week at [facility].  Call with Prednisone dosage changes ('15mg'$  daily starting 6/21). Please keep green leafy veggies and boost in your diet. Call with any new meds [phone] & if you have any procedures where you need to hold warfarin, have them fax a clearance form or labs to [phone].

## 2022-04-10 NOTE — Progress Notes (Signed)
CBG 409 at 1827  1830- PC to Dr. Chryl Heck, informed her patient's CBG is 409, she said to inform patient to eat dinner, take her CBG & take her sliding scale insulin, then to check CBG at bedtime & to call her if her CBG is greater than 300.  Patient informed of these instructions, informed her if she needs to speak with Dr. Chryl Heck she needs to call the [facility] main number & tell the nurse on call she needs to speak with Dr. Chryl Heck.  Patient verbalizes understanding of all instructions.

## 2022-04-10 NOTE — Telephone Encounter (Signed)
Pt came in for lab and brought to desk of this RN due to noted SOB- and abd discomfort.  Nyhla states symptoms of gastric discomfort unrelieved with current use of protonix/  This RN verified with her pharmacy that she picked up 120 tabs of pantoprazole on 6/23 - she does not have other PPI's on her MAR with [facility].  Pt verified she is using above bid.  Awaiting lab results and keeping pt with this RN for MD review.

## 2022-04-12 ENCOUNTER — Inpatient Hospital Stay: Payer: Medicare HMO

## 2022-04-12 ENCOUNTER — Emergency Department (HOSPITAL_COMMUNITY): Payer: Medicare HMO

## 2022-04-12 ENCOUNTER — Other Ambulatory Visit: Payer: Self-pay

## 2022-04-12 ENCOUNTER — Inpatient Hospital Stay (HOSPITAL_COMMUNITY)
Admission: EM | Admit: 2022-04-12 | Discharge: 2022-04-16 | DRG: 690 | Disposition: A | Discharge status: Home Health | Payer: Medicare HMO | Attending: Family Medicine | Admitting: Family Medicine

## 2022-04-12 ENCOUNTER — Encounter (HOSPITAL_COMMUNITY): Payer: Self-pay | Admitting: Oncology

## 2022-04-12 DIAGNOSIS — R197 Diarrhea, unspecified: Secondary | ICD-10-CM | POA: Diagnosis not present

## 2022-04-12 DIAGNOSIS — I7782 Antineutrophilic cytoplasmic antibody (ANCA) vasculitis: Secondary | ICD-10-CM | POA: Diagnosis present

## 2022-04-12 DIAGNOSIS — E1151 Type 2 diabetes mellitus with diabetic peripheral angiopathy without gangrene: Secondary | ICD-10-CM | POA: Diagnosis present

## 2022-04-12 DIAGNOSIS — I2510 Atherosclerotic heart disease of native coronary artery without angina pectoris: Secondary | ICD-10-CM | POA: Diagnosis present

## 2022-04-12 DIAGNOSIS — I480 Paroxysmal atrial fibrillation: Secondary | ICD-10-CM | POA: Diagnosis present

## 2022-04-12 DIAGNOSIS — N321 Vesicointestinal fistula: Secondary | ICD-10-CM | POA: Diagnosis not present

## 2022-04-12 DIAGNOSIS — M8580 Other specified disorders of bone density and structure, unspecified site: Secondary | ICD-10-CM | POA: Diagnosis present

## 2022-04-12 DIAGNOSIS — Z7983 Long term (current) use of bisphosphonates: Secondary | ICD-10-CM | POA: Diagnosis not present

## 2022-04-12 DIAGNOSIS — D500 Iron deficiency anemia secondary to blood loss (chronic): Secondary | ICD-10-CM | POA: Diagnosis present

## 2022-04-12 DIAGNOSIS — D649 Anemia, unspecified: Secondary | ICD-10-CM | POA: Diagnosis not present

## 2022-04-12 DIAGNOSIS — E7800 Pure hypercholesterolemia, unspecified: Secondary | ICD-10-CM | POA: Diagnosis present

## 2022-04-12 DIAGNOSIS — I130 Hypertensive heart and chronic kidney disease with heart failure and stage 1 through stage 4 chronic kidney disease, or unspecified chronic kidney disease: Secondary | ICD-10-CM | POA: Diagnosis present

## 2022-04-12 DIAGNOSIS — Z881 Allergy status to other antibiotic agents status: Secondary | ICD-10-CM

## 2022-04-12 DIAGNOSIS — D591 Autoimmune hemolytic anemia, unspecified: Secondary | ICD-10-CM | POA: Diagnosis present

## 2022-04-12 DIAGNOSIS — E1165 Type 2 diabetes mellitus with hyperglycemia: Secondary | ICD-10-CM | POA: Diagnosis present

## 2022-04-12 DIAGNOSIS — N184 Chronic kidney disease, stage 4 (severe): Secondary | ICD-10-CM | POA: Diagnosis present

## 2022-04-12 DIAGNOSIS — Z951 Presence of aortocoronary bypass graft: Secondary | ICD-10-CM

## 2022-04-12 DIAGNOSIS — Z83438 Family history of other disorder of lipoprotein metabolism and other lipidemia: Secondary | ICD-10-CM

## 2022-04-12 DIAGNOSIS — N8110 Cystocele, unspecified: Secondary | ICD-10-CM

## 2022-04-12 DIAGNOSIS — E119 Type 2 diabetes mellitus without complications: Secondary | ICD-10-CM | POA: Diagnosis not present

## 2022-04-12 DIAGNOSIS — E1122 Type 2 diabetes mellitus with diabetic chronic kidney disease: Secondary | ICD-10-CM | POA: Diagnosis present

## 2022-04-12 DIAGNOSIS — Z79899 Other long term (current) drug therapy: Secondary | ICD-10-CM | POA: Diagnosis not present

## 2022-04-12 DIAGNOSIS — Z79890 Hormone replacement therapy (postmenopausal): Secondary | ICD-10-CM

## 2022-04-12 DIAGNOSIS — I272 Pulmonary hypertension, unspecified: Secondary | ICD-10-CM | POA: Diagnosis present

## 2022-04-12 DIAGNOSIS — R1084 Generalized abdominal pain: Secondary | ICD-10-CM | POA: Diagnosis not present

## 2022-04-12 DIAGNOSIS — R791 Abnormal coagulation profile: Secondary | ICD-10-CM

## 2022-04-12 DIAGNOSIS — N3080 Other cystitis without hematuria: Secondary | ICD-10-CM | POA: Diagnosis present

## 2022-04-12 DIAGNOSIS — K5730 Diverticulosis of large intestine without perforation or abscess without bleeding: Secondary | ICD-10-CM | POA: Diagnosis not present

## 2022-04-12 DIAGNOSIS — D696 Thrombocytopenia, unspecified: Secondary | ICD-10-CM | POA: Diagnosis present

## 2022-04-12 DIAGNOSIS — K219 Gastro-esophageal reflux disease without esophagitis: Secondary | ICD-10-CM | POA: Diagnosis present

## 2022-04-12 DIAGNOSIS — E876 Hypokalemia: Secondary | ICD-10-CM | POA: Diagnosis not present

## 2022-04-12 DIAGNOSIS — N3090 Cystitis, unspecified without hematuria: Secondary | ICD-10-CM | POA: Diagnosis not present

## 2022-04-12 DIAGNOSIS — N3946 Mixed incontinence: Secondary | ICD-10-CM | POA: Diagnosis present

## 2022-04-12 DIAGNOSIS — Z952 Presence of prosthetic heart valve: Secondary | ICD-10-CM | POA: Diagnosis not present

## 2022-04-12 DIAGNOSIS — K5792 Diverticulitis of intestine, part unspecified, without perforation or abscess without bleeding: Secondary | ICD-10-CM | POA: Diagnosis present

## 2022-04-12 DIAGNOSIS — I10 Essential (primary) hypertension: Secondary | ICD-10-CM | POA: Diagnosis present

## 2022-04-12 DIAGNOSIS — N993 Prolapse of vaginal vault after hysterectomy: Secondary | ICD-10-CM | POA: Diagnosis present

## 2022-04-12 DIAGNOSIS — Z888 Allergy status to other drugs, medicaments and biological substances status: Secondary | ICD-10-CM

## 2022-04-12 DIAGNOSIS — I700 Atherosclerosis of aorta: Secondary | ICD-10-CM | POA: Diagnosis present

## 2022-04-12 DIAGNOSIS — Z87891 Personal history of nicotine dependence: Secondary | ICD-10-CM

## 2022-04-12 DIAGNOSIS — Z8249 Family history of ischemic heart disease and other diseases of the circulatory system: Secondary | ICD-10-CM

## 2022-04-12 DIAGNOSIS — Z794 Long term (current) use of insulin: Secondary | ICD-10-CM

## 2022-04-12 DIAGNOSIS — I5032 Chronic diastolic (congestive) heart failure: Secondary | ICD-10-CM | POA: Diagnosis present

## 2022-04-12 DIAGNOSIS — Z7984 Long term (current) use of oral hypoglycemic drugs: Secondary | ICD-10-CM

## 2022-04-12 DIAGNOSIS — Z7901 Long term (current) use of anticoagulants: Secondary | ICD-10-CM

## 2022-04-12 DIAGNOSIS — R6510 Systemic inflammatory response syndrome (SIRS) of non-infectious origin without acute organ dysfunction: Secondary | ICD-10-CM

## 2022-04-12 DIAGNOSIS — N3289 Other specified disorders of bladder: Secondary | ICD-10-CM | POA: Diagnosis not present

## 2022-04-12 DIAGNOSIS — I959 Hypotension, unspecified: Secondary | ICD-10-CM | POA: Diagnosis not present

## 2022-04-12 DIAGNOSIS — R1030 Lower abdominal pain, unspecified: Secondary | ICD-10-CM | POA: Diagnosis not present

## 2022-04-12 LAB — URINALYSIS, ROUTINE W REFLEX MICROSCOPIC
Bilirubin Urine: NEGATIVE
Glucose, UA: NEGATIVE mg/dL
Hgb urine dipstick: NEGATIVE
Ketones, ur: NEGATIVE mg/dL
Nitrite: NEGATIVE
Protein, ur: 30 mg/dL — AB
Specific Gravity, Urine: 1.009 (ref 1.005–1.030)
pH: 5 (ref 5.0–8.0)

## 2022-04-12 LAB — BPAM RBC
Blood Product Expiration Date: 202307202359
Blood Product Expiration Date: 202307202359
ISSUE DATE / TIME: 202307031551
Unit Type and Rh: 6200
Unit Type and Rh: 6200

## 2022-04-12 LAB — BRAIN NATRIURETIC PEPTIDE: B Natriuretic Peptide: 61.5 pg/mL (ref 0.0–100.0)

## 2022-04-12 LAB — TYPE AND SCREEN
ABO/RH(D): A POS
Antibody Screen: NEGATIVE
Unit division: 0
Unit division: 0

## 2022-04-12 LAB — COMPREHENSIVE METABOLIC PANEL
ALT: 18 U/L (ref 0–44)
AST: 21 U/L (ref 15–41)
Albumin: 3 g/dL — ABNORMAL LOW (ref 3.5–5.0)
Alkaline Phosphatase: 42 U/L (ref 38–126)
Anion gap: 10 (ref 5–15)
BUN: 154 mg/dL — ABNORMAL HIGH (ref 8–23)
CO2: 19 mmol/L — ABNORMAL LOW (ref 22–32)
Calcium: 9 mg/dL (ref 8.9–10.3)
Chloride: 106 mmol/L (ref 98–111)
Creatinine, Ser: 2.62 mg/dL — ABNORMAL HIGH (ref 0.44–1.00)
GFR, Estimated: 18 mL/min — ABNORMAL LOW (ref >60)
Glucose, Bld: 212 mg/dL — ABNORMAL HIGH (ref 70–99)
Potassium: 4.7 mmol/L (ref 3.5–5.1)
Sodium: 135 mmol/L (ref 135–145)
Total Bilirubin: 0.9 mg/dL (ref 0.3–1.2)
Total Protein: 5.2 g/dL — ABNORMAL LOW (ref 6.5–8.1)

## 2022-04-12 LAB — CBC WITH DIFFERENTIAL/PLATELET
Abs Immature Granulocytes: 0.05 10*3/uL (ref 0.00–0.07)
Basophils Absolute: 0 10*3/uL (ref 0.0–0.1)
Basophils Relative: 0 %
Eosinophils Absolute: 0.1 10*3/uL (ref 0.0–0.5)
Eosinophils Relative: 1 %
HCT: 19.5 % — ABNORMAL LOW (ref 36.0–46.0)
Hemoglobin: 6.3 g/dL — CL (ref 12.0–15.0)
Immature Granulocytes: 1 %
Lymphocytes Relative: 6 %
Lymphs Abs: 0.5 10*3/uL — ABNORMAL LOW (ref 0.7–4.0)
MCH: 30.7 pg (ref 26.0–34.0)
MCHC: 32.3 g/dL (ref 30.0–36.0)
MCV: 95.1 fL (ref 80.0–100.0)
Monocytes Absolute: 0.6 10*3/uL (ref 0.1–1.0)
Monocytes Relative: 6 %
Neutro Abs: 7.4 10*3/uL (ref 1.7–7.7)
Neutrophils Relative %: 86 %
Platelets: 141 10*3/uL — ABNORMAL LOW (ref 150–400)
RBC: 2.05 MIL/uL — ABNORMAL LOW (ref 3.87–5.11)
RDW: 17.4 % — ABNORMAL HIGH (ref 11.5–15.5)
WBC: 8.6 10*3/uL (ref 4.0–10.5)
nRBC: 0 % (ref 0.0–0.2)

## 2022-04-12 LAB — PROTIME-INR
INR: 1.9 — ABNORMAL HIGH (ref 0.8–1.2)
Prothrombin Time: 21.5 seconds — ABNORMAL HIGH (ref 11.4–15.2)

## 2022-04-12 LAB — CBG MONITORING, ED
Glucose-Capillary: 339 mg/dL — ABNORMAL HIGH (ref 70–99)
Glucose-Capillary: 214 mg/dL — ABNORMAL HIGH (ref 70–99)

## 2022-04-12 LAB — HEMOGLOBIN AND HEMATOCRIT, BLOOD
HCT: 21.2 % — ABNORMAL LOW (ref 36.0–46.0)
Hemoglobin: 6.8 g/dL — CL (ref 12.0–15.0)

## 2022-04-12 LAB — GLUCOSE, CAPILLARY
Glucose-Capillary: 381 mg/dL — ABNORMAL HIGH (ref 70–99)
Glucose-Capillary: 409 mg/dL — ABNORMAL HIGH (ref 70–99)

## 2022-04-12 LAB — LACTIC ACID, PLASMA
Lactic Acid, Venous: 1.1 mmol/L (ref 0.5–1.9)
Lactic Acid, Venous: 1.5 mmol/L (ref 0.5–1.9)

## 2022-04-12 LAB — LIPASE, BLOOD: Lipase: 34 U/L (ref 11–51)

## 2022-04-12 LAB — PREPARE RBC (CROSSMATCH)

## 2022-04-12 MED ORDER — SODIUM CHLORIDE 0.9% IV SOLUTION: Freq: Once | INTRAVENOUS | Status: AC

## 2022-04-12 MED ORDER — LACTATED RINGERS IV SOLN
Freq: Once | INTRAVENOUS | Status: AC
Start: 2022-04-12 — End: 2022-04-12

## 2022-04-12 MED ORDER — ONDANSETRON HCL 4 MG/2ML IJ SOLN
4.0000 mg | Freq: Once | INTRAMUSCULAR | Status: AC
Start: 2022-04-12 — End: 2022-04-12
  Administered 2022-04-12: 4 mg via INTRAVENOUS
  Filled 2022-04-12: qty 2

## 2022-04-12 MED ORDER — FAMOTIDINE 20 MG PO TABS
20.0000 mg | ORAL_TABLET | Freq: Two times a day (BID) | ORAL | Status: DC
  Administered 2022-04-12 – 2022-04-14 (×4): 20 mg via ORAL
  Filled 2022-04-12 (×4): qty 1

## 2022-04-12 MED ORDER — ACETAMINOPHEN 650 MG RE SUPP: 650.0000 mg | Freq: Four times a day (QID) | RECTAL | Status: DC | PRN

## 2022-04-12 MED ORDER — ALPRAZOLAM 0.25 MG PO TABS
0.2500 mg | ORAL_TABLET | Freq: Every day | ORAL | Status: DC
  Administered 2022-04-12 – 2022-04-15 (×4): 0.25 mg via ORAL
  Filled 2022-04-12 (×4): qty 1

## 2022-04-12 MED ORDER — PIPERACILLIN-TAZOBACTAM IN DEX 2-0.25 GM/50ML IV SOLN
2.2500 g | Freq: Once | INTRAVENOUS | Status: AC
  Administered 2022-04-12: 2.25 g via INTRAVENOUS
  Filled 2022-04-12: qty 50

## 2022-04-12 MED ORDER — HYDRALAZINE HCL 50 MG PO TABS
100.0000 mg | ORAL_TABLET | Freq: Three times a day (TID) | ORAL | Status: DC
  Administered 2022-04-12 – 2022-04-16 (×11): 100 mg via ORAL
  Filled 2022-04-12 (×12): qty 2

## 2022-04-12 MED ORDER — WARFARIN SODIUM 7.5 MG PO TABS
7.5000 mg | ORAL_TABLET | Freq: Once | ORAL | Status: AC
Start: 2022-04-12 — End: 2022-04-12
  Administered 2022-04-12: 7.5 mg via ORAL
  Filled 2022-04-12 (×2): qty 1

## 2022-04-12 MED ORDER — FUROSEMIDE 40 MG PO TABS
80.0000 mg | ORAL_TABLET | Freq: Every day | ORAL | Status: DC
  Administered 2022-04-13 – 2022-04-16 (×4): 80 mg via ORAL
  Filled 2022-04-12 (×4): qty 2

## 2022-04-12 MED ORDER — SPIRONOLACTONE 25 MG PO TABS
25.0000 mg | ORAL_TABLET | Freq: Every day | ORAL | Status: DC
  Administered 2022-04-13 – 2022-04-16 (×4): 25 mg via ORAL
  Filled 2022-04-12 (×5): qty 1

## 2022-04-12 MED ORDER — PIPERACILLIN-TAZOBACTAM IN DEX 2-0.25 GM/50ML IV SOLN
2.2500 g | Freq: Three times a day (TID) | INTRAVENOUS | Status: DC
  Filled 2022-04-12: qty 50

## 2022-04-12 MED ORDER — INSULIN ASPART 100 UNIT/ML IJ SOLN
0.0000 [IU] | Freq: Every day | INTRAMUSCULAR | Status: DC
  Administered 2022-04-12 – 2022-04-13 (×2): 2 [IU] via SUBCUTANEOUS
  Administered 2022-04-14: 3 [IU] via SUBCUTANEOUS
  Administered 2022-04-15: 4 [IU] via SUBCUTANEOUS
  Filled 2022-04-12: qty 0.05

## 2022-04-12 MED ORDER — PIPERACILLIN-TAZOBACTAM 3.375 G IVPB
3.3750 g | Freq: Two times a day (BID) | INTRAVENOUS | Status: DC
  Administered 2022-04-12 – 2022-04-16 (×8): 3.375 g via INTRAVENOUS
  Filled 2022-04-12 (×7): qty 50

## 2022-04-12 MED ORDER — FAMOTIDINE 20 MG PO TABS: 20.0000 mg | ORAL_TABLET | Freq: Every day | ORAL | Status: DC

## 2022-04-12 MED ORDER — FAMOTIDINE 20 MG PO TABS: 20.0000 mg | ORAL_TABLET | Freq: Two times a day (BID) | ORAL | Status: DC

## 2022-04-12 MED ORDER — MAGNESIUM OXIDE -MG SUPPLEMENT 400 (240 MG) MG PO TABS
400.0000 mg | ORAL_TABLET | Freq: Every day | ORAL | Status: DC
  Administered 2022-04-13 – 2022-04-16 (×4): 400 mg via ORAL
  Filled 2022-04-12 (×4): qty 1

## 2022-04-12 MED ORDER — INSULIN ASPART 100 UNIT/ML IJ SOLN
0.0000 [IU] | Freq: Three times a day (TID) | INTRAMUSCULAR | Status: DC
  Administered 2022-04-12: 11 [IU] via SUBCUTANEOUS
  Administered 2022-04-13 (×2): 5 [IU] via SUBCUTANEOUS
  Administered 2022-04-13: 11 [IU] via SUBCUTANEOUS
  Administered 2022-04-14: 15 [IU] via SUBCUTANEOUS
  Administered 2022-04-14: 5 [IU] via SUBCUTANEOUS
  Administered 2022-04-15: 3 [IU] via SUBCUTANEOUS
  Administered 2022-04-15: 2 [IU] via SUBCUTANEOUS
  Administered 2022-04-15 – 2022-04-16 (×3): 5 [IU] via SUBCUTANEOUS
  Filled 2022-04-12: qty 0.15

## 2022-04-12 MED ORDER — INSULIN GLARGINE-YFGN 100 UNIT/ML ~~LOC~~ SOLN
14.0000 [IU] | Freq: Every day | SUBCUTANEOUS | Status: DC
  Administered 2022-04-12 – 2022-04-15 (×4): 14 [IU] via SUBCUTANEOUS
  Filled 2022-04-12 (×5): qty 0.14

## 2022-04-12 MED ORDER — ACETAMINOPHEN 325 MG PO TABS
650.0000 mg | ORAL_TABLET | Freq: Four times a day (QID) | ORAL | Status: DC | PRN
  Administered 2022-04-13 – 2022-04-16 (×3): 650 mg via ORAL
  Filled 2022-04-12 (×3): qty 2

## 2022-04-12 MED ORDER — INSULIN DEGLUDEC 100 UNIT/ML ~~LOC~~ SOPN
14.0000 [IU] | PEN_INJECTOR | Freq: Every day | SUBCUTANEOUS | Status: DC
Start: 2022-04-12 — End: 2022-04-12

## 2022-04-12 MED ORDER — MAGNESIUM OXIDE -MG SUPPLEMENT 400 (240 MG) MG PO TABS: 400.0000 mg | ORAL_TABLET | Freq: Every day | ORAL | Status: DC

## 2022-04-12 MED ORDER — SODIUM CHLORIDE 0.9 % IV SOLN
10.0000 mL/h | Freq: Once | INTRAVENOUS | Status: AC
  Administered 2022-04-12: 10 mL/h via INTRAVENOUS

## 2022-04-12 MED ORDER — TADALAFIL (PAH) 20 MG PO TABS
40.0000 mg | ORAL_TABLET | Freq: Every day | ORAL | Status: DC
  Administered 2022-04-12 – 2022-04-15 (×4): 40 mg via ORAL
  Filled 2022-04-12 (×4): qty 2

## 2022-04-12 MED ORDER — PANTOPRAZOLE SODIUM 40 MG PO TBEC
40.0000 mg | DELAYED_RELEASE_TABLET | Freq: Every day | ORAL | Status: DC
  Administered 2022-04-12 – 2022-04-16 (×5): 40 mg via ORAL
  Filled 2022-04-12 (×5): qty 1

## 2022-04-12 MED ORDER — FUROSEMIDE 40 MG PO TABS
40.0000 mg | ORAL_TABLET | Freq: Every day | ORAL | Status: DC
  Administered 2022-04-12 – 2022-04-15 (×4): 40 mg via ORAL
  Filled 2022-04-12 (×4): qty 1

## 2022-04-12 MED ORDER — METOPROLOL SUCCINATE ER 25 MG PO TB24
12.5000 mg | ORAL_TABLET | Freq: Every day | ORAL | Status: DC
  Administered 2022-04-13 – 2022-04-16 (×4): 12.5 mg via ORAL
  Filled 2022-04-12 (×3): qty 1
  Filled 2022-04-12: qty 0.5
  Filled 2022-04-12: qty 1

## 2022-04-12 MED ORDER — LACTATED RINGERS IV BOLUS
1000.0000 mL | Freq: Once | INTRAVENOUS | Status: AC
  Administered 2022-04-12: 1000 mL via INTRAVENOUS

## 2022-04-12 MED ORDER — PREDNISONE 20 MG PO TABS
20.0000 mg | ORAL_TABLET | Freq: Every day | ORAL | Status: DC
  Administered 2022-04-13 – 2022-04-16 (×4): 20 mg via ORAL
  Filled 2022-04-12 (×4): qty 1

## 2022-04-12 MED ORDER — OXYCODONE-ACETAMINOPHEN 7.5-325 MG PO TABS
1.0000 | ORAL_TABLET | Freq: Four times a day (QID) | ORAL | Status: DC | PRN
  Administered 2022-04-12: 1 via ORAL
  Filled 2022-04-12: qty 1

## 2022-04-12 MED ORDER — AMLODIPINE BESYLATE 10 MG PO TABS
10.0000 mg | ORAL_TABLET | Freq: Every day | ORAL | Status: DC
  Administered 2022-04-13 – 2022-04-16 (×4): 10 mg via ORAL
  Filled 2022-04-12 (×4): qty 1

## 2022-04-12 MED ORDER — WARFARIN - PHARMACIST DOSING INPATIENT: Freq: Every day | Status: DC

## 2022-04-12 MED ORDER — FENTANYL CITRATE PF 50 MCG/ML IJ SOSY
12.5000 ug | PREFILLED_SYRINGE | INTRAMUSCULAR | Status: DC | PRN
  Administered 2022-04-12: 12.5 ug via INTRAVENOUS
  Filled 2022-04-12: qty 1

## 2022-04-12 MED ORDER — MYCOPHENOLATE MOFETIL 250 MG PO CAPS
500.0000 mg | ORAL_CAPSULE | Freq: Two times a day (BID) | ORAL | Status: DC
  Administered 2022-04-12 – 2022-04-16 (×8): 500 mg via ORAL
  Filled 2022-04-12 (×9): qty 2

## 2022-04-12 MED ORDER — CLONIDINE HCL 0.2 MG PO TABS
0.2000 mg | ORAL_TABLET | Freq: Two times a day (BID) | ORAL | Status: DC
  Administered 2022-04-12 – 2022-04-16 (×8): 0.2 mg via ORAL
  Filled 2022-04-12: qty 2
  Filled 2022-04-12 (×7): qty 1

## 2022-04-12 MED ORDER — ATORVASTATIN CALCIUM 40 MG PO TABS
80.0000 mg | ORAL_TABLET | Freq: Every day | ORAL | Status: DC
  Administered 2022-04-13 – 2022-04-15 (×3): 80 mg via ORAL
  Filled 2022-04-12 (×3): qty 2

## 2022-04-12 NOTE — ED Triage Notes (Signed)
Pt bib [facility] d/t abdominal pain that began this morning as well as diarrhea x 3 days.

## 2022-04-12 NOTE — Progress Notes (Signed)
A consult was received from an ED physician for Zosyn per pharmacy dosing.  [name] profile has been reviewed for ht/wt/allergies/indication/available labs.    A one time order has been placed for Zosyn 2.25g IV.  Further antibiotics/pharmacy consults should be ordered by admitting physician if indicated.                       Thank you, Luiz Ochoa 04/12/2022  2:18 PM

## 2022-04-12 NOTE — ED Notes (Addendum)
Pt did not have BM, only urinated. This Probation officer assisted pt off bedpan and provided toilet hygiene. This writer placed a clean pad under pt. Pt laying in bed comfortable, w daughter at bedside

## 2022-04-12 NOTE — Progress Notes (Addendum)
[facility] for warfarin Indication: PAF, mech AVR  Allergies  Allergen Reactions   Flagyl [Metronidazole] Rash    ALL-OVER BODY RASH   Ciprofloxacin     Other reaction(s): Stomach upset - tolerates Other reaction(s): Stomach upset - tolerates   Coreg [Carvedilol] Other (See Comments)    Terrible cramping in the feet and had a lot of bowel movements, but not diarrhea   Diflucan [Fluconazole] Diarrhea   Losartan Swelling    Patient doesn't recall site of swelling   Privigen [Immune Globulin (Human)] Other (See Comments)    Severe/excruciating pain   Sulfamethoxazole-Trimethoprim     Other reaction(s): stomach upset .   Verapamil Hives   Zetia [Ezetimibe] Other (See Comments)    Reaction not recalled   Zocor [Simvastatin - High Dose] Other (See Comments)    Reaction not recalled   Gatifloxacin Rash    Redness to skin around eye    Patient Measurements: Height: '4\' 11"'$  (149.9 cm) Weight: 63 kg (139 lb) IBW/kg (Calculated) : 43.2  Vital Signs: Temp: 98.4 F (36.9 C) (07/05 1801) Temp Source: Oral (07/05 1801) BP: 119/56 (07/05 1801) Pulse Rate: 87 (07/05 1801)  Labs: Recent Labs    04/10/22 1310 04/10/22 1311 04/12/22 1140 04/12/22 1149  HGB 6.5*  --   --  6.3*  HCT 19.4*  --   --  19.5*  PLT 125*  --   --  141*  LABPROT  --  33.3*  --  21.5*  INR  --  3.3*  --  1.9*  CREATININE 2.55*  --  2.62*  --     Estimated Creatinine Clearance: 13.2 mL/min (A) (by C-G formula based on SCr of 2.62 mg/dL (H)).   Medical History: Past Medical History:  Diagnosis Date   Aortic atherosclerosis ([place]) 01/23/2018   Atrial fibrillation (HCC)    Benign positional vertigo 10/06/2011   Carotid artery disease (HCC)    Chemotherapy induced neutropenia ([place]) 07/21/2019   Cholelithiasis 01/23/2018   Cholelithiasis 01/23/2018   Chronic anticoagulation    Coronary artery disease    status post coronary artery bypass grafting times 07/10/2004    Diabetes mellitus    Hypercholesteremia    Hypertension    Mechanical heart valve present    H. aortic valve replacement at the time of bypass surgery October 2005   Moderate to severe pulmonary hypertension ([place])    Peripheral arterial disease ([place])    history of left common iliac artery PTA and stenting for a chronic total occlusion 08/26/01    Medications:  (Not in a hospital admission)  Scheduled:   furosemide  40 mg Oral QHS   [START ON 04/13/2022] furosemide  80 mg Oral Daily   insulin aspart  0-15 Units Subcutaneous TID WC   insulin aspart  0-5 Units Subcutaneous QHS   PRN: fentaNYL (SUBLIMAZE) injection, oxyCODONE-acetaminophen  Assessment: 78 yoF with PMH PAF & mechanical AVR on warfarin, CAD s/p CABG 2005, PAH, DM, HTN, dCHF, vasculitis, autoimmune hemolytic anemia, admitted 7/5 for diverticulitis flare. Pharmacy consulted to continue warfarin while admitted. Received PRBC on 7/3 for Hgb 6.5.  Baseline INR slightly subtherapeutic Prior anticoagulation: warfarin 5 mg daily except 7.5 mg Wed, Fri, Sun; LD 7/4  Significant events:  Today, 04/12/2022: CBC: Hgb low d/t hemolytic anemia; another PRBC ordered x 1 INR slightly subtherapeutic Major drug interactions: none; broad spectrum abx (Zosyn) can increase warfarin sensitivity No bleeding issues per nursing CLD ordered  Goal of Therapy: INR  2-3 (confirmed with clinic records)  Plan: Warfarin 7.5 mg PO tonight x 1; Hgb low but no active bleeding Daily INR CBC daily while Hgb < 7 Monitor for signs of bleeding or thrombosis   Reuel Boom, PharmD, BCPS [phone] 04/12/2022, 6:50 PM

## 2022-04-12 NOTE — Progress Notes (Addendum)
Pharmacy Antibiotic Note  [name] is a 81 y.o. female admitted on 04/12/2022 with  intra-abdominal infection .  Pharmacy has been consulted for Zosyn dosing.  Plan: Zosyn 3.375 g IV q12 hr for CrCl < 20 Pharmacy will sign off, following peripherally for culture results or dose adjustments. Please reconsult if a change in clinical status warrants re-evaluation of dosage.   Height: '4\' 11"'$  (149.9 cm) Weight: 63 kg (139 lb) IBW/kg (Calculated) : 43.2  Temp (24hrs), Avg:98.2 F (36.8 C), Min:97.8 F (36.6 C), Max:98.4 F (36.9 C)  Recent Labs  Lab 04/07/22 1127 04/10/22 1310 04/12/22 1140 04/12/22 1149  WBC 8.6 7.9  --  8.6  CREATININE 2.48* 2.55* 2.62*  --     Estimated Creatinine Clearance: 13.2 mL/min (A) (by C-G formula based on SCr of 2.62 mg/dL (H)).    Allergies  Allergen Reactions   Flagyl [Metronidazole] Rash    ALL-OVER BODY RASH   Ciprofloxacin     Other reaction(s): Stomach upset - tolerates Other reaction(s): Stomach upset - tolerates   Coreg [Carvedilol] Other (See Comments)    Terrible cramping in the feet and had a lot of bowel movements, but not diarrhea   Diflucan [Fluconazole] Diarrhea   Losartan Swelling    Patient doesn't recall site of swelling   Privigen [Immune Globulin (Human)] Other (See Comments)    Severe/excruciating pain   Sulfamethoxazole-Trimethoprim     Other reaction(s): stomach upset .   Verapamil Hives   Zetia [Ezetimibe] Other (See Comments)    Reaction not recalled   Zocor [Simvastatin - High Dose] Other (See Comments)    Reaction not recalled   Gatifloxacin Rash    Redness to skin around eye     Thank you for allowing pharmacy to be a part of this patient's care.  Reuel Boom, PharmD, BCPS [phone] 04/12/2022, 6:43 PM

## 2022-04-12 NOTE — H&P (Signed)
History and Physical    Patient: [name] ZJQ:[id number] DOB: December 13, 1940 DOA: 04/12/2022 DOS: the patient was seen and examined on 04/12/2022 PCP: [name], [name], MD  Patient coming from: Home  Chief Complaint:  Chief Complaint  Patient presents with   Abdominal Pain   HPI: [name] is a 81 y.o. female with medical history significant of autoimmune hemolytic anemia, DM, HLD, HTN, vasculitis, chronic diastolic HF, PAH, PAF. Presenting with abdominal pain. Symptoms started 3 days ago. It was a generalized, crampy pain. She spoke with her oncologist about it. She was given a prescription for famotidine and it seemed to help a little. However, at 0300 hrs this morning, her symptoms flared again. She's had diarrhea and nausea, but no fever or vomiting. She also reports poor PO intake over the last several days and generalized weakness. When her symptoms worsened this morning, she decided to come to the ED for evaluation. She denies any other aggravating or alleviating factors.   Review of Systems: As mentioned in the history of present illness. All other systems reviewed and are negative. Past Medical History:  Diagnosis Date   Aortic atherosclerosis ([place]) 01/23/2018   Atrial fibrillation ([facility])    Benign positional vertigo 10/06/2011   Carotid artery disease ([place])    Chemotherapy induced neutropenia ([place]) 07/21/2019   Cholelithiasis 01/23/2018   Cholelithiasis 01/23/2018   Chronic anticoagulation    Coronary artery disease    status post coronary artery bypass grafting times 07/10/2004   Diabetes mellitus    Hypercholesteremia    Hypertension    Mechanical heart valve present    H. aortic valve replacement at the time of bypass surgery October 2005   Moderate to severe pulmonary hypertension ([place])    Peripheral arterial disease ([place])    history of left common iliac artery PTA and stenting for a chronic total occlusion 08/26/01   Past Surgical History:  Procedure Laterality  Date   anterior repair  2009   AORTIC VALVE REPLACEMENT  2005   Dr. Cyndia Bent   CARDIAC CATHETERIZATION  11/10/2004   40% right common illiac, 70% in stent restenosis of distal left common illiac,    CARDIAC CATHETERIZATION  05/18/2004   LAD 50-70% midstenosis, RCA dominant w/50% stenosis, 50% Right common Illiac artery ostial stenosis, 90% in stent restenosis within midportion of left common illiac stent   Carotid Duplex  03/12/2012   RSA-elev. velocities suggestive of a 50-69% diameter reduction, Right&Left Bulb/Prox ICA-mild-mod.fibrous plaqueelevating Velocities abnormal study.   CHOLECYSTECTOMY N/A 03/01/2018   Procedure: LAPAROSCOPIC CHOLECYSTECTOMY;  Surgeon: Judeth Horn, MD;  Location: [place];  Service: General;  Laterality: N/A;   COLONOSCOPY WITH PROPOFOL N/A 01/22/2018   Procedure: COLONOSCOPY WITH PROPOFOL;  Surgeon: Wilford Corner, MD;  Location: [place];  Service: Endoscopy;  Laterality: N/A;   ESOPHAGOGASTRODUODENOSCOPY (EGD) WITH PROPOFOL N/A 01/22/2018   Procedure: ESOPHAGOGASTRODUODENOSCOPY (EGD) WITH PROPOFOL;  Surgeon: Wilford Corner, MD;  Location: [place];  Service: Endoscopy;  Laterality: N/A;   ESOPHAGOGASTRODUODENOSCOPY (EGD) WITH PROPOFOL N/A 11/21/2018   Procedure: ESOPHAGOGASTRODUODENOSCOPY (EGD) WITH PROPOFOL;  Surgeon: [name], MD;  Location: [place];  Service: Gastroenterology;  Laterality: N/A;   ESOPHAGOGASTRODUODENOSCOPY (EGD) WITH PROPOFOL Left 02/27/2019   Procedure: ESOPHAGOGASTRODUODENOSCOPY (EGD) WITH PROPOFOL;  Surgeon: Arta Silence, MD;  Location: [facility] ENDOSCOPY;  Service: Endoscopy;  Laterality: Left;   GIVENS CAPSULE STUDY N/A 11/21/2018   Procedure: GIVENS CAPSULE STUDY;  Surgeon: [name], MD;  Location: [place];  Service: Gastroenterology;  Laterality: N/A;  To be deployed during EGD   Lower Ext. Duplex  03/12/2012   Right Proximal CIA- vessel narrowing w/elevated velocities 0-49% diameter reduction. Right SFA-mild mixed  density plaque throughout vessel.   NM MYOCAR PERF WALL MOTION  05/19/2010   protocol: Persantine, post stress EF 65%, negative for ischemia, low risk scan   RIGHT HEART CATH N/A 06/27/2018   Procedure: RIGHT HEART CATH;  Surgeon: [name], MD;  Location: [place] [facility];  Service: Cardiovascular;  Laterality: N/A;   TOTAL ABDOMINAL HYSTERECTOMY W/ BILATERAL SALPINGOOPHORECTOMY  1989   TRANSTHORACIC ECHOCARDIOGRAM  08/29/2012   Moderately calcified annulus of mitral valve, moderate regurg. of both mitral valve and tricuspid valve.    Social History:  reports that she has quit smoking. Her smoking use included cigarettes. She has a 30.00 pack-year smoking history. She has never used smokeless tobacco. She reports that she does not drink alcohol and does not use drugs.  Allergies  Allergen Reactions   Flagyl [Metronidazole] Rash    ALL-OVER BODY RASH   Ciprofloxacin     Other reaction(s): Stomach upset - tolerates Other reaction(s): Stomach upset - tolerates   Coreg [Carvedilol] Other (See Comments)    Terrible cramping in the feet and had a lot of bowel movements, but not diarrhea   Diflucan [Fluconazole] Diarrhea   Losartan Swelling    Patient doesn't recall site of swelling   Privigen [Immune Globulin (Human)] Other (See Comments)    Severe/excruciating pain   Sulfamethoxazole-Trimethoprim     Other reaction(s): stomach upset .   Verapamil Hives   Zetia [Ezetimibe] Other (See Comments)    Reaction not recalled   Zocor [Simvastatin - High Dose] Other (See Comments)    Reaction not recalled   Gatifloxacin Rash    Redness to skin around eye    Family History  Problem Relation Age of Onset   Heart disease Father    Hypertension Father    Hyperlipidemia Father    Breast cancer Neg Hx     Prior to Admission medications   Medication Sig Start Date End Date Taking? Authorizing Provider  acetaminophen (TYLENOL) 325 MG tablet Take 650 mg by mouth every 6 (six) hours as  needed for mild pain.     [provider]  alendronate (FOSAMAX) 70 MG tablet Take 70 mg by mouth every Saturday. 12/06/21   [provider]  allopurinol (ZYLOPRIM) 100 MG tablet Take 100 mg by mouth daily.    [provider]  ALPRAZolam Duanne Moron) 0.5 MG tablet Take 0.25-0.5 mg by mouth at bedtime. 07/27/21   [provider]  amLODipine (NORVASC) 10 MG tablet TAKE 1 TABLET EVERY DAY Patient taking differently: Take 10 mg by mouth daily. 05/13/21   [name], MD  atorvastatin (LIPITOR) 80 MG tablet Take 1 tablet (80 mg total) by mouth daily at 6 PM. 11/22/18   [name], MD  Blood Glucose Monitoring Suppl (TRUE METRIX METER) w/Device KIT  03/18/21   [provider]  Cholecalciferol (VITAMIN D3) 50 MCG (2000 UT) capsule Take 2,000 Units by mouth daily with lunch. 11/26/18   [provider]  cloNIDine (CATAPRES) 0.2 MG tablet TAKE 1 TABLET TWICE DAILY Patient taking differently: Take 0.2 mg by mouth 2 (two) times daily. 06/22/21   [name], MD  Cyanocobalamin (VITAMIN B12) 1000 MCG TBCR Take 1,000 mcg by mouth daily with lunch.    [provider]  estradiol (ESTRACE) 0.1 MG/GM vaginal cream Place 0.5g nightly for  two weeks then twice a week after Patient taking differently: Place 1 Applicatorful vaginally 3 (three) times a week. Place 0.5g nightly for two weeks then twice a week after 09/19/21   Jaquita Folds, MD  famotidine (PEPCID) 20 MG tablet Take 1 tablet (20 mg total) by mouth 2 (two) times daily. 04/10/22   Benay Pike, MD  furosemide (LASIX) 40 MG tablet TAKE 2 TABLETS (80 MG TOTAL) IN MORNING AND 1 AND 1/2 TABLETS (60 MG TOTAL) IN EVENING. CHANGE IN DOSAGE. 03/22/22   Clegg, Amy D, NP  glipiZIDE (GLUCOTROL) 5 MG tablet Take 1 tablet (5 mg total) by mouth daily. 01/09/22   Benay Pike, MD  glucose blood (TRUE METRIX BLOOD GLUCOSE TEST) test strip Use 1-2 times daily    [provider]  hydrALAZINE  (APRESOLINE) 100 MG tablet TAKE 1 TABLET THREE TIMES DAILY 02/17/22   [name], MD  insulin aspart (NOVOLOG) 100 UNIT/ML injection Take 2 units for blood sugar 200-250, 4 units for blood sugar 252-300, 6 units for blood sugar 301-350, 8 units for blood sugar greater than 350 and call MD. 02/23/22   Gardenia Phlegm, NP  Insulin Syringe-Needle U-100 (B-D INS SYR ULTRAFINE .3CC/29G) 29G X 1/2" 0.3 ML MISC Use for sliding scale for breakthrough high blood sugar readings 02/24/22   Benay Pike, MD  lactose free nutrition (BOOST) LIQD Take 237 mLs by mouth daily. (To supplement meals)     [provider]  lidocaine (XYLOCAINE) 2 % solution Use as directed 15 mLs in the mouth or throat as needed for mouth pain. 01/30/22   Benay Pike, MD  magnesium oxide (MAG-OX) 400 (241.3 Mg) MG tablet Take 1 tablet (400 mg total) by mouth daily. 01/30/18   [name], MD  metoprolol succinate (TOPROL-XL) 25 MG 24 hr tablet TAKE 1/2 TABLET EVERY DAY Patient taking differently: Take 12.5 mg by mouth daily. 07/19/21   [name], MD  mycophenolate (CELLCEPT) 500 MG tablet Take 2 tablets (1,000 mg total) by mouth daily. 03/13/22   Causey, Charlestine Massed, NP  ondansetron (ZOFRAN-ODT) 4 MG disintegrating tablet Take 4 mg by mouth every 8 (eight) hours as needed for vomiting or nausea. 01/12/22   [provider]  pantoprazole (PROTONIX) 40 MG tablet Take 1 tablet (40 mg total) by mouth daily. 04/07/22   Benay Pike, MD  spironolactone (ALDACTONE) 25 MG tablet TAKE 1 TABLET EVERY DAY Patient taking differently: Take 25 mg by mouth daily. 05/13/21   [name], MD  tadalafil, PAH, (ADCIRCA) 20 MG tablet Take 2 tablets (40 mg total) by mouth daily. Patient taking differently: Take 40 mg by mouth at bedtime. 12/06/21   [name], MD  TRESIBA FLEXTOUCH 100 UNIT/ML FlexTouch Pen Inject 14 Units into the skin at bedtime. 02/20/22   [provider]  TRUE METRIX  BLOOD GLUCOSE TEST test strip SMARTSIG:Via Meter 04/08/21   [provider]  vitamin C (ASCORBIC ACID) 500 MG tablet Take 500 mg by mouth daily with lunch.    [provider]  vitamin C (ASCORBIC ACID) 500 MG tablet     [provider]  warfarin (COUMADIN) 5 MG tablet Take 1 to 1 &1/2 tablets by mouth daily as directed by the coumadin clinic Patient taking differently: Take 5-7.5 mg by mouth See admin instructions. 5 mg on Monday,Wednesday,Thursday,Friday,Saturday 7.5 mg on Tuesday and sunday 12/06/21   Lorretta Harp, MD    Physical Exam: Vitals:   04/12/22 1138  04/12/22 1230 04/12/22 1341 04/12/22 1342  BP: (!) 148/51 (!) 135/45 134/62   Pulse: (!) 114 95 (!) 103 92  Resp: 18 19 (!) 32 (!) 24  Temp: 98.4 F (36.9 C)     TempSrc: Oral     SpO2: 100% 100% 100% 100%  Weight:      Height:       General: 81 y.o. female resting in bed in NAD Eyes: PERRL, normal sclera ENMT: Nares patent w/o discharge, orophaynx clear, dentition normal, ears w/o discharge/lesions/ulcers Neck: Supple, trachea midline Cardiovascular: RRR, +S1, S2, no m/g/r, equal pulses throughout Respiratory: CTABL, no w/r/r, normal WOB GI: BS+, ND, mild TTP epigastric, no masses noted, no organomegaly noted MSK: No c/c; BLE edema 3+; splotchy erythema of BLE that apparently has been chronic Neuro: A&O x 3, no focal deficits Psyc: Appropriate interaction and affect, calm/cooperative  Data Reviewed:  Na+  135 K+  4.7 CO2  19 Glucose 212 BUN  154 Scr  2.6 WBC  8.6 Hgb  6.3 Plt  141  CT ab/pelvis 1. Emphysematous cystitis but without substantial surrounding stranding or bladder wall thickening. Consider correlation with urinalysis and urologic and or ID consultation for further management as this can be life-threatening. Close follow-up is suggested. 2. Acute diverticulitis of the sigmoid colon. 3. Gas in the urinary bladder is increased since previous imaging. 4. Aortic  atherosclerosis and coronary artery disease. 5. Similar appearance of L4 compression fracture. Aortic Atherosclerosis (ICD10-I70.0) and Emphysema (ICD10-J43.9).  Assessment and Plan: Diverticulitis Diarrhea SIRS     - admit to inpt, progressive     - continue zosyn     - check stool studies     - check lactic acid     - CLD for now     - gentle fluids  Emphysematous cystitis      - EDP spoke with urology; rec'd foley placement and abx (as above)  Autoimmune hemolytic anemia Thrombocytopenia Vasculitis     - transfusing 1 unit pRBCs, trend Hgb     - spoke with onco; continue home regimen for now  Subtherapeutic INR     - her INR was 3.3 on 7/3, now 1.9 today     - no bleeds noted on imaging     - I think right now ok to continue, pharm consult for coumadin dosing/monitoring  DM2     - CLD for now     - SS1, glucose checks, N8G  Chronic diastolic HF PAH PAF     - while she does have ble; family reports that this is chronic at this point     - will get some gentle hydration tonight; follow     - resume diuretics tomorrow, continue home regimen otherwise  CKD 4     - she is at baseline, watch nephrotoxins  HLD     - continue home regimen  GERD     - continue home regimen  Advance Care Planning:   Code Status: FULL  Consults: Oncology (Dr. Chryl Heck)  Family Communication: w/ family at bedside  Severity of Illness: The appropriate patient status for this patient is INPATIENT. Inpatient status is judged to be reasonable and necessary in order to provide the required intensity of service to ensure the patient's safety. [name] presenting symptoms, physical exam findings, and initial radiographic and laboratory data in the context of their chronic comorbidities is felt to place them at high risk for further clinical deterioration. Furthermore, it is not anticipated that  the patient will be medically stable for discharge from the hospital within 2 midnights of  admission.   * I certify that at the point of admission it is my clinical judgment that the patient will require inpatient hospital care spanning beyond 2 midnights from the point of admission due to high intensity of service, high risk for further deterioration and high frequency of surveillance required.*  Author: Jonnie Finner, DO 04/12/2022 2:23 PM  For on call review [email or web address].

## 2022-04-12 NOTE — ED Provider Notes (Addendum)
[facility] DEPT Provider Note   CSN: [id number] Arrival date & time: 04/12/22  1124     History  Chief Complaint  Patient presents with   Abdominal Pain    [name] is a 81 y.o. female.   Abdominal Pain Patient presents with abdominal pain nausea vomiting diarrhea.  Has had for the last few days.  Worse however the last couple days.  States she always has been nauseous in the morning since being started on prednisone.  Has a autoimmune hemolytic anemia.  Has been having diarrhea the last 2 days.  States decreased oral intake.  Has not had vomiting but states she has had nausea.  Feels weak.  Pain is diffuse.  No recent antibiotics.    Past Medical History:  Diagnosis Date   Aortic atherosclerosis ([place]) 01/23/2018   Atrial fibrillation (HCC)    Benign positional vertigo 10/06/2011   Carotid artery disease [facility])    Chemotherapy induced neutropenia ([place]) 07/21/2019   Cholelithiasis 01/23/2018   Cholelithiasis 01/23/2018   Chronic anticoagulation    Coronary artery disease    status post coronary artery bypass grafting times 07/10/2004   Diabetes mellitus    Hypercholesteremia    Hypertension    Mechanical heart valve present    H. aortic valve replacement at the time of bypass surgery October 2005   Moderate to severe pulmonary hypertension ([place])    Peripheral arterial disease ([place])    history of left common iliac artery PTA and stenting for a chronic total occlusion 08/26/01    Home Medications Prior to Admission medications   Medication Sig Start Date End Date Taking? Authorizing Provider  acetaminophen (TYLENOL) 325 MG tablet Take 650 mg by mouth every 6 (six) hours as needed for mild pain.     [provider]  alendronate (FOSAMAX) 70 MG tablet Take 70 mg by mouth every Saturday. 12/06/21   [provider]  allopurinol (ZYLOPRIM) 100 MG tablet Take 100 mg by mouth daily.    [provider]  ALPRAZolam  Duanne Moron) 0.5 MG tablet Take 0.25-0.5 mg by mouth at bedtime. 07/27/21   [provider]  amLODipine (NORVASC) 10 MG tablet TAKE 1 TABLET EVERY DAY Patient taking differently: Take 10 mg by mouth daily. 05/13/21   [name], MD  atorvastatin (LIPITOR) 80 MG tablet Take 1 tablet (80 mg total) by mouth daily at 6 PM. 11/22/18   [name], MD  Blood Glucose Monitoring Suppl (TRUE METRIX METER) w/Device KIT  03/18/21   [provider]  Cholecalciferol (VITAMIN D3) 50 MCG (2000 UT) capsule Take 2,000 Units by mouth daily with lunch. 11/26/18   [provider]  cloNIDine (CATAPRES) 0.2 MG tablet TAKE 1 TABLET TWICE DAILY Patient taking differently: Take 0.2 mg by mouth 2 (two) times daily. 06/22/21   [name], MD  Cyanocobalamin (VITAMIN B12) 1000 MCG TBCR Take 1,000 mcg by mouth daily with lunch.    [provider]  estradiol (ESTRACE) 0.1 MG/GM vaginal cream Place 0.5g nightly for two weeks then twice a week after Patient taking differently: Place 1 Applicatorful vaginally 3 (three) times a week. Place 0.5g nightly for two weeks then twice a week after 09/19/21   Jaquita Folds, MD  famotidine (PEPCID) 20 MG tablet Take 1 tablet (20 mg total) by mouth 2 (two) times daily. 04/10/22   Benay Pike, MD  furosemide (LASIX) 40 MG tablet TAKE 2 TABLETS (80 MG TOTAL) IN MORNING AND 1  AND 1/2 TABLETS (60 MG TOTAL) IN EVENING. CHANGE IN DOSAGE. 03/22/22   Clegg, Amy D, NP  glipiZIDE (GLUCOTROL) 5 MG tablet Take 1 tablet (5 mg total) by mouth daily. 01/09/22   Benay Pike, MD  glucose blood (TRUE METRIX BLOOD GLUCOSE TEST) test strip Use 1-2 times daily    [provider]  hydrALAZINE (APRESOLINE) 100 MG tablet TAKE 1 TABLET THREE TIMES DAILY 02/17/22   [name], MD  insulin aspart (NOVOLOG) 100 UNIT/ML injection Take 2 units for blood sugar 200-250, 4 units for blood sugar 252-300, 6 units for blood sugar 301-350, 8 units for blood sugar  greater than 350 and call MD. 02/23/22   Gardenia Phlegm, NP  Insulin Syringe-Needle U-100 (B-D INS SYR ULTRAFINE .3CC/29G) 29G X 1/2" 0.3 ML MISC Use for sliding scale for breakthrough high blood sugar readings 02/24/22   Benay Pike, MD  lactose free nutrition (BOOST) LIQD Take 237 mLs by mouth daily. (To supplement meals)     [provider]  lidocaine (XYLOCAINE) 2 % solution Use as directed 15 mLs in the mouth or throat as needed for mouth pain. 01/30/22   Benay Pike, MD  magnesium oxide (MAG-OX) 400 (241.3 Mg) MG tablet Take 1 tablet (400 mg total) by mouth daily. 01/30/18   [name], MD  metoprolol succinate (TOPROL-XL) 25 MG 24 hr tablet TAKE 1/2 TABLET EVERY DAY Patient taking differently: Take 12.5 mg by mouth daily. 07/19/21   [name], MD  mycophenolate (CELLCEPT) 500 MG tablet Take 2 tablets (1,000 mg total) by mouth daily. 03/13/22   Causey, Charlestine Massed, NP  ondansetron (ZOFRAN-ODT) 4 MG disintegrating tablet Take 4 mg by mouth every 8 (eight) hours as needed for vomiting or nausea. 01/12/22   [provider]  pantoprazole (PROTONIX) 40 MG tablet Take 1 tablet (40 mg total) by mouth daily. 04/07/22   Benay Pike, MD  spironolactone (ALDACTONE) 25 MG tablet TAKE 1 TABLET EVERY DAY Patient taking differently: Take 25 mg by mouth daily. 05/13/21   [name], MD  tadalafil, PAH, (ADCIRCA) 20 MG tablet Take 2 tablets (40 mg total) by mouth daily. Patient taking differently: Take 40 mg by mouth at bedtime. 12/06/21   [name], MD  TRESIBA FLEXTOUCH 100 UNIT/ML FlexTouch Pen Inject 14 Units into the skin at bedtime. 02/20/22   [provider]  TRUE METRIX BLOOD GLUCOSE TEST test strip SMARTSIG:Via Meter 04/08/21   [provider]  vitamin C (ASCORBIC ACID) 500 MG tablet Take 500 mg by mouth daily with lunch.    [provider]  vitamin C (ASCORBIC ACID) 500 MG tablet     [provider]   warfarin (COUMADIN) 5 MG tablet Take 1 to 1 &1/2 tablets by mouth daily as directed by the coumadin clinic Patient taking differently: Take 5-7.5 mg by mouth See admin instructions. 5 mg on Monday,Wednesday,Thursday,Friday,Saturday 7.5 mg on Tuesday and sunday 12/06/21   Lorretta Harp, MD      Allergies    Flagyl [metronidazole], Ciprofloxacin, Coreg [carvedilol], Diflucan [fluconazole], Losartan, Privigen [immune globulin (human)], Sulfamethoxazole-trimethoprim, Verapamil, Zetia [ezetimibe], Zocor [simvastatin - high dose], and Gatifloxacin    Review of Systems   Review of Systems  Gastrointestinal:  Positive for abdominal pain.    Physical Exam Updated Vital Signs BP 134/62   Pulse 92   Temp 98.4 F (36.9 C) (Oral)   Resp (!) 24   Ht _0  (1.499 m)   Wt  63 kg   SpO2 100%   BMI 28.07 kg/m  Physical Exam Vitals and nursing note reviewed.  HENT:     Head: Atraumatic.  Cardiovascular:     Rate and Rhythm: Tachycardia present.  Pulmonary:     Breath sounds: Normal breath sounds.  Abdominal:     Tenderness: There is abdominal tenderness.     Hernia: No hernia is present.     Comments: Somewhat diffuse tenderness rebound or guarding.  No hernias palpated.  Skin:    General: Skin is warm.     Capillary Refill: Capillary refill takes less than 2 seconds.  Neurological:     Mental Status: She is alert and oriented to person, place, and time.     ED Results / Procedures / Treatments   Labs (all labs ordered are listed, but only abnormal results are displayed) Labs Reviewed  COMPREHENSIVE METABOLIC PANEL - Abnormal; Notable for the following components:      Result Value   CO2 19 (*)    Glucose, Bld 212 (*)    BUN 154 (*)    Creatinine, Ser 2.62 (*)    Total Protein 5.2 (*)    Albumin 3.0 (*)    GFR, Estimated 18 (*)    All other components within normal limits  CBC WITH DIFFERENTIAL/PLATELET - Abnormal; Notable for the following components:   RBC 2.05 (*)     Hemoglobin 6.3 (*)    HCT 19.5 (*)    RDW 17.4 (*)    Platelets 141 (*)    Lymphs Abs 0.5 (*)    All other components within normal limits  PROTIME-INR - Abnormal; Notable for the following components:   Prothrombin Time 21.5 (*)    INR 1.9 (*)    All other components within normal limits  LIPASE, BLOOD  URINALYSIS, ROUTINE W REFLEX MICROSCOPIC  PREPARE RBC (CROSSMATCH)  TYPE AND SCREEN    EKG None  Radiology CT ABDOMEN PELVIS WO CONTRAST  Result Date: 04/12/2022 CLINICAL DATA:  A female at age 66 presents with acute abdominal pain. EXAM: CT ABDOMEN AND PELVIS WITHOUT CONTRAST TECHNIQUE: Multidetector CT imaging of the abdomen and pelvis was performed following the standard protocol without IV contrast. RADIATION DOSE REDUCTION: This exam was performed according to the departmental dose-optimization program which includes automated exposure control, adjustment of the mA and/or kV according to patient size and/or use of iterative reconstruction technique. COMPARISON:  Feb 20, 2022. FINDINGS: Lower chest: Calcified coronary artery disease. Signs of prior granulomatous disease. Hepatobiliary: Liver with smooth contours. Post cholecystectomy. No gross biliary duct distension. Pancreas: No signs of pancreatic inflammation.  Pancreatic atrophy. Spleen: Granulomatous changes in the spleen. Adrenals/Urinary Tract: Renal vascular calcifications. No perinephric stranding, no hydronephrosis or obstructing renal calculus. Normal adrenal glands. Gas throughout the urinary bladder at the periphery. Gas within the urinary bladder and about the urinary bladder is slightly increased from previous imaging. Density of material in the urinary bladder otherwise with simple fluid density. Towards the base of the urinary bladder is difficult to exclude the possibility of emphysematous cystitis based on the relationship of small gas locules of the wall of the urinary bladder. There is no substantial perivesical  stranding. Stomach/Bowel: Small hiatal hernia. Small duodenal diverticulum. No acute small bowel process. The appendix is not visible though there are no secondary signs to suggest acute appendicitis. Colon is largely collapsed. There are signs of acute diverticulitis in the LEFT lower quadrant. Posterior diverticulum on image 48/2 with some mild surrounding  stranding. Diverticulosis and diverticular disease throughout the sigmoid is moderate to marked and otherwise unchanged. Vascular/Lymphatic: Calcified aortic atherosclerosis. No aneurysmal dilation of the abdominal aorta. Signs of prior stenting of the LEFT common iliac artery. No adenopathy in either the abdomen or in the pelvis. Reproductive: Post hysterectomy without adnexal mass. Other: No free air or ascites. Musculoskeletal: Osteopenia. No acute bone finding or destructive bone process. Spinal degenerative changes. Redemonstration of compression fracture at L4 with similar appearance. IMPRESSION: 1. Emphysematous cystitis but without substantial surrounding stranding or bladder wall thickening. Consider correlation with urinalysis and urologic and or ID consultation for further management as this can be life-threatening. Close follow-up is suggested. 2. Acute diverticulitis of the sigmoid colon. 3. Gas in the urinary bladder is increased since previous imaging. 4. Aortic atherosclerosis and coronary artery disease. 5. Similar appearance of L4 compression fracture. Aortic Atherosclerosis (ICD10-I70.0) and Emphysema (ICD10-J43.9). These results were called by telephone at the time of interpretation on 04/12/2022 at 1:14 pm to provider Davonna Belling , who verbally acknowledged these results. Electronically Signed   By: Zetta Bills M.D.   On: 04/12/2022 13:19    Procedures Procedures    Medications Ordered in ED Medications  piperacillin-tazobactam (ZOSYN) IVPB 2.25 g (2.25 g Intravenous New Bag/Given 04/12/22 1340)  0.9 %  sodium chloride infusion  (has no administration in time range)  lactated ringers bolus 1,000 mL (0 mLs Intravenous Stopped 04/12/22 1341)  ondansetron (ZOFRAN) injection 4 mg (4 mg Intravenous Given 04/12/22 1157)    ED Course/ Medical Decision Making/ A&P                           Medical Decision Making Amount and/or Complexity of Data Reviewed Labs: ordered. Radiology: ordered.  Risk Prescription drug management. Decision regarding hospitalization.  Critical Care Total time providing critical care: 30 minutes  Patient with abdominal pain.  Has had diarrhea.  Worsening the last few days.  History of hemolytic anemia on prednisone and mycophenolate.  Also on Coumadin.  White count reassuring and hemoglobin is decreased.  Will transfuse after discussion with Dr. Chryl Heck,, from oncology who is familiar with patient.  CT scan done and independently interpreted and shows diverticulitis and then advanced limitus cystitis.  Does have thickening of bladder wall with air in it..  More concerning with immunosuppression.  Discussed with Dr. Wendy Poet from urology.  Recommended IV antibiotics and Foley catheter.  Will admit to internal medicine.  Does not appear septic at this time.  Patient is on Coumadin for mechanical heart valve.  I reviewed oncology notes.        Final Clinical Impression(s) / ED Diagnoses Final diagnoses:  Emphysematous cystitis  Autoimmune hemolytic anemia ([place])  Diverticulitis    Rx / DC Orders ED Discharge Orders     None         Davonna Belling, MD 04/12/22 1404    Davonna Belling, MD 04/12/22 [phone]

## 2022-04-12 NOTE — ED Notes (Signed)
Pt states she needed to have a BM. Pt placed on bedpan

## 2022-04-13 DIAGNOSIS — N8110 Cystocele, unspecified: Secondary | ICD-10-CM

## 2022-04-13 DIAGNOSIS — D591 Autoimmune hemolytic anemia, unspecified: Secondary | ICD-10-CM

## 2022-04-13 DIAGNOSIS — K5792 Diverticulitis of intestine, part unspecified, without perforation or abscess without bleeding: Secondary | ICD-10-CM | POA: Diagnosis not present

## 2022-04-13 DIAGNOSIS — I5032 Chronic diastolic (congestive) heart failure: Secondary | ICD-10-CM | POA: Diagnosis not present

## 2022-04-13 DIAGNOSIS — N184 Chronic kidney disease, stage 4 (severe): Secondary | ICD-10-CM

## 2022-04-13 DIAGNOSIS — N3080 Other cystitis without hematuria: Secondary | ICD-10-CM

## 2022-04-13 LAB — CBC
HCT: 24.3 % — ABNORMAL LOW (ref 36.0–46.0)
Hemoglobin: 8.1 g/dL — ABNORMAL LOW (ref 12.0–15.0)
MCH: 30.2 pg (ref 26.0–34.0)
MCHC: 33.3 g/dL (ref 30.0–36.0)
MCV: 90.7 fL (ref 80.0–100.0)
Platelets: 112 10*3/uL — ABNORMAL LOW (ref 150–400)
RBC: 2.68 MIL/uL — ABNORMAL LOW (ref 3.87–5.11)
RDW: 17.9 % — ABNORMAL HIGH (ref 11.5–15.5)
WBC: 6.4 10*3/uL (ref 4.0–10.5)
nRBC: 0 % (ref 0.0–0.2)

## 2022-04-13 LAB — COMPREHENSIVE METABOLIC PANEL
ALT: 18 U/L (ref 0–44)
AST: 21 U/L (ref 15–41)
Albumin: 2.7 g/dL — ABNORMAL LOW (ref 3.5–5.0)
Alkaline Phosphatase: 36 U/L — ABNORMAL LOW (ref 38–126)
Anion gap: 11 (ref 5–15)
BUN: 142 mg/dL — ABNORMAL HIGH (ref 8–23)
CO2: 21 mmol/L — ABNORMAL LOW (ref 22–32)
Calcium: 8.5 mg/dL — ABNORMAL LOW (ref 8.9–10.3)
Chloride: 105 mmol/L (ref 98–111)
Creatinine, Ser: 2.51 mg/dL — ABNORMAL HIGH (ref 0.44–1.00)
GFR, Estimated: 19 mL/min — ABNORMAL LOW (ref >60)
Glucose, Bld: 127 mg/dL — ABNORMAL HIGH (ref 70–99)
Potassium: 4.2 mmol/L (ref 3.5–5.1)
Sodium: 137 mmol/L (ref 135–145)
Total Bilirubin: 1.1 mg/dL (ref 0.3–1.2)
Total Protein: 4.9 g/dL — ABNORMAL LOW (ref 6.5–8.1)

## 2022-04-13 LAB — GLUCOSE, CAPILLARY
Glucose-Capillary: 227 mg/dL — ABNORMAL HIGH (ref 70–99)
Glucose-Capillary: 215 mg/dL — ABNORMAL HIGH (ref 70–99)
Glucose-Capillary: 301 mg/dL — ABNORMAL HIGH (ref 70–99)
Glucose-Capillary: 247 mg/dL — ABNORMAL HIGH (ref 70–99)

## 2022-04-13 LAB — PROTIME-INR
INR: 1.7 — ABNORMAL HIGH (ref 0.8–1.2)
Prothrombin Time: 20.1 seconds — ABNORMAL HIGH (ref 11.4–15.2)

## 2022-04-13 MED ORDER — CHLORHEXIDINE GLUCONATE CLOTH 2 % EX PADS
6.0000 | MEDICATED_PAD | Freq: Every day | CUTANEOUS | Status: DC
  Administered 2022-04-13 – 2022-04-16 (×4): 6 via TOPICAL

## 2022-04-13 MED ORDER — WARFARIN SODIUM 5 MG PO TABS: 7.5000 mg | ORAL_TABLET | Freq: Once | ORAL | Status: DC

## 2022-04-13 MED ORDER — WARFARIN SODIUM 5 MG PO TABS
7.5000 mg | ORAL_TABLET | Freq: Once | ORAL | Status: AC
  Administered 2022-04-13: 7.5 mg via ORAL
  Filled 2022-04-13: qty 1

## 2022-04-13 NOTE — Progress Notes (Signed)
Progress Note   Patient: [name] WRU:[id number] DOB: [birth year] DOA: 04/12/2022     1 DOS: the patient was seen and examined on 04/13/2022 at 8:45 AM      Brief hospital course: [name] is an 81 y.o. 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  In the ER, CT showed emphysematous cystitis, possible diverticulitis.  Foley placed, started on antibiotics and admitted.     Assessment and Plan: * Emphysematous cystitis Reviewed with radiology, low degree of prolapse on imaging, does clearly appear to be emphysematous cystitis.  Pelvic bladder prolapse and mycophenolate both predispose to infection.  Discussed with Urology, plan for foley and antibiotics.  Sepsis ruled out. - Continue [place] [facility], appreciate expertise  Acute diverticulitis I am uncertain to the degree which this is the place.  Her abdominal pain is diffuse, diverticulitis is not pronounced on imaging.  Regardless it is covered by the antibiotics for cystitis. - Clears for 1 more day, advance diet tomorrow  Autoimmune hemolytic anemia ([place]) Follows with hematology.  Hemoglobin 6.3 on admission, now status posttransfusion.  Hemoglobin improved. - Continue mycophenolate, prednisone for now - Consult oncology, appreciate recommendations  Female bladder prolapse - Continue tadalafil  Subtherapeutic international normalized ratio (INR) INR 1.7 - Continue warfarin  CKD (chronic kidney disease), stage IV (HCC) Creatinine stable relative to baseline 2.4-2.6  Vasculitis, ANCA positive - Continue mycophenolate  PAF (paroxysmal atrial fibrillation) (HCC) In the presence of Saint Jude mechanical valve.  Rate controlled -Continue warfarin - Continue metoprolol, clonidine  Moderate to  severe pulmonary hypertension (HCC) - Continue home spironolactone and furosemide  Chronic diastolic CHF (congestive heart failure) (HCC) Appears relatively euvolemic - Continue furosemide spironolactone  GERD (gastroesophageal reflux disease) - Continue famotidine and pantoprazole  Thrombocytopenia (HCC) Platelets 111, stable  Diabetes mellitus without complication (HCC) With hyperglycemia, glucose is somewhat elevated today - Continue glargine - Continue sliding scale corrections for now, if sugars do not come down on clears, will increase dose tomorrow  Hypercholesteremia - Continue atorvastatin  Hypertension Blood pressure still somewhat elevated - Continue amlodipine, clonidine, furosemide, hydralazine, metoprolol, spironolactone          Subjective: No fever overnight, abdominal pain is slightly better, no vomiting, no diarrhea, no confusion.     Physical Exam: Vitals:   04/13/22 0600 04/13/22 0615 04/13/22 0620 04/13/22 0638  BP: (!) 165/60 (!) 172/58  (!) 147/50  Pulse: 75 87  80  Resp: 19 (!) 22  20  Temp:   98 F (36.7 C) 97.7 F (36.5 C)  TempSrc:   Oral Oral  SpO2: 98% 97%  100%  Weight:    63.3 kg  Height:    '4\' 11"'$  (1.499 m)   Elderly adult female, appears debilitated and chronically ill, lying in bed, somewhat pale, interactive RRR, soft systolic murmur, no peripheral edema Abdomen with diffuse tenderness to deep palpation in all quadrants, not isolated to one side, no rigidity, no guarding Attention normal, affect appropriate, judgment insight appear normal, oriented to person, place, time, and situation, moves upper extremities with generalized weakness but symmetric strength, speech fluent     Data Reviewed: CT of the abdomen and pelvis was reviewed with radiology Glucose is elevated Creatinine is 2.6 down to 2.5 overnight Basic metabolic panel normal Lactate  normal Hemoglobin 6.3 up to 8.1 after blood transfusion Platelets 111 INR  1.7          Disposition: Status is: Inpatient         Author: Edwin Dada, MD 04/13/2022 9:03 AM  For on call review [email or web address].

## 2022-04-13 NOTE — Assessment & Plan Note (Signed)
-   Continue famotidine and pantoprazole

## 2022-04-13 NOTE — Progress Notes (Signed)
Attempted to insert a Foley twice with 2 other Urology RNs and was unsuccessful. Pt has a vaginal prolapse.MD aware.

## 2022-04-13 NOTE — Assessment & Plan Note (Addendum)
With hyperglycemia Today glucoses better controlled -Continue glargine - Continue sliding scale corrections

## 2022-04-13 NOTE — Assessment & Plan Note (Addendum)
-   Continue home spironolactone and furosemide

## 2022-04-13 NOTE — Assessment & Plan Note (Addendum)
Follows with hematology.  Hemoglobin 6.3 on admission, now status posttransfusion.  Hemoglobin improved.  Hgb back down to 7.1 today. - Repeat PRBC transfusion - Continue mycophenolate, prednisone - Consult oncology, appreciate recommendations

## 2022-04-13 NOTE — Assessment & Plan Note (Addendum)
Reviewed with radiology, low degree of prolapse on imaging, does clearly appear to be emphysematous cystitis.  Pelvic bladder prolapse and mycophenolate both predispose to infection.   Sepsis ruled out.  Diverticulitis ruled out.  Afebrile, respiratory rate and heart rate normal.  Urine culture still pending - Continue antibiotics - Follow urine culture - Maintain Foley 7 days and outpatient follow-up with urology

## 2022-04-13 NOTE — Hospital Course (Addendum)
[name] is an 81 y.o. 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  In the ER, CT showed emphysematous cystitis, possible diverticulitis.  Foley placed, started on antibiotics and admitted.

## 2022-04-13 NOTE — Assessment & Plan Note (Signed)
Creatinine stable relative to baseline 2.4-2.6

## 2022-04-13 NOTE — Assessment & Plan Note (Addendum)
Platelets stable 

## 2022-04-13 NOTE — Assessment & Plan Note (Signed)
Continue atorvastatin

## 2022-04-13 NOTE — Assessment & Plan Note (Addendum)
-   Continue mycophenolate, prednisone

## 2022-04-13 NOTE — Assessment & Plan Note (Signed)
Appears relatively euvolemic - Continue furosemide spironolactone

## 2022-04-13 NOTE — Consult Note (Signed)
Urology Consult   Physician requesting consult: Cherylann Ratel, DO  Reason for consult: Emphysematous cystitis  History of Present Illness: [name] is a 81 y.o.who is a patient of Dr. Matilde Sprang who has a history of autoimmune hemolytic anemia, diabetes, hyperlipidemia, hypertension, vasculitis, chronic diastolic heart failure, PAH, PAF who presented to the ED yesterday with generalized crampy abdominal pain for 3 days.  She had some diarrhea, nausea.  She had no emesis.  She was afebrile.  She did have some reduced p.o. intake over the previous several days with generalized weakness.  CT A/P 04/12/2022 demonstrated emphysematous cystitis without substantial bladder stranding or bladder wall thickening.  She was also found to have acute diverticulitis of the sigmoid colon.  There was some increased gas present in the bladder compared to prior imaging.  Of note, she had CT A/P 02/20/2022 that demonstrated gas throughout the bladder with bladder wall thickening with extensive diverticulosis suggestive of a colovesicular fistula.  There is also gas present within the vagina could indicate a vesicovaginal fistula.  She follows with Dr. Matilde Sprang  with vaginal prolapse.  She has noted a vaginal bulge.  She has had a prior hysterectomy.  She does have a history of a prior bladder lift procedure.  She does complain of urge incontinence and stress urinary incontinence with coughing.  She also complains of incontinence when she stands.  She does wear 1 pad a day.  She was evaluated in the office in 12/2020 with grade 3 cystocele.  Given her comorbidities, plan was to proceed with conservative management.  Over the past several days, she has had some lower pelvic discomfort and dysuria.  She denies fevers.  Urinalysis 7/5 with negative nitrates, small leukocyte esterase, many bacteria.  Urine cultures pending.  She has been placed on Zosyn.   Past Medical History:  Diagnosis Date   Aortic atherosclerosis ([place])  01/23/2018   Atrial fibrillation ([facility])    Benign positional vertigo 10/06/2011   Carotid artery disease ([place])    Chemotherapy induced neutropenia ([place]) 07/21/2019   Cholelithiasis 01/23/2018   Cholelithiasis 01/23/2018   Chronic anticoagulation    Coronary artery disease    status post coronary artery bypass grafting times 07/10/2004   Diabetes mellitus    Hypercholesteremia    Hypertension    Mechanical heart valve present    H. aortic valve replacement at the time of bypass surgery October 2005   Moderate to severe pulmonary hypertension ([place])    Peripheral arterial disease ([place])    history of left common iliac artery PTA and stenting for a chronic total occlusion 08/26/01    Past Surgical History:  Procedure Laterality Date   anterior repair  2009   AORTIC VALVE REPLACEMENT  2005   Dr. Cyndia Bent   CARDIAC CATHETERIZATION  11/10/2004   40% right common illiac, 70% in stent restenosis of distal left common illiac,    CARDIAC CATHETERIZATION  05/18/2004   LAD 50-70% midstenosis, RCA dominant w/50% stenosis, 50% Right common Illiac artery ostial stenosis, 90% in stent restenosis within midportion of left common illiac stent   Carotid Duplex  03/12/2012   RSA-elev. velocities suggestive of a 50-69% diameter reduction, Right&Left Bulb/Prox ICA-mild-mod.fibrous plaqueelevating Velocities abnormal study.   CHOLECYSTECTOMY N/A 03/01/2018   Procedure: LAPAROSCOPIC CHOLECYSTECTOMY;  Surgeon: Judeth Horn, MD;  Location: [place];  Service: General;  Laterality: N/A;   COLONOSCOPY WITH PROPOFOL N/A 01/22/2018   Procedure: COLONOSCOPY WITH PROPOFOL;  Surgeon: Wilford Corner, MD;  Location: [place];  Service: Endoscopy;  Laterality: N/A;   ESOPHAGOGASTRODUODENOSCOPY (EGD) WITH PROPOFOL N/A 01/22/2018   Procedure: ESOPHAGOGASTRODUODENOSCOPY (EGD) WITH PROPOFOL;  Surgeon: Wilford Corner, MD;  Location: [facility];  Service: Endoscopy;  Laterality: N/A;   ESOPHAGOGASTRODUODENOSCOPY (EGD) WITH  PROPOFOL N/A 11/21/2018   Procedure: ESOPHAGOGASTRODUODENOSCOPY (EGD) WITH PROPOFOL;  Surgeon: [name], MD;  Location: [place];  Service: Gastroenterology;  Laterality: N/A;   ESOPHAGOGASTRODUODENOSCOPY (EGD) WITH PROPOFOL Left 02/27/2019   Procedure: ESOPHAGOGASTRODUODENOSCOPY (EGD) WITH PROPOFOL;  Surgeon: Arta Silence, MD;  Location: [facility];  Service: Endoscopy;  Laterality: Left;   GIVENS CAPSULE STUDY N/A 11/21/2018   Procedure: GIVENS CAPSULE STUDY;  Surgeon: [name], MD;  Location: [place];  Service: Gastroenterology;  Laterality: N/A;  To be deployed during EGD   Lower Ext. Duplex  03/12/2012   Right Proximal CIA- vessel narrowing w/elevated velocities 0-49% diameter reduction. Right SFA-mild mixed density plaque throughout vessel.   NM MYOCAR PERF WALL MOTION  05/19/2010   protocol: Persantine, post stress EF 65%, negative for ischemia, low risk scan   RIGHT HEART CATH N/A 06/27/2018   Procedure: RIGHT HEART CATH;  Surgeon: [name], MD;  Location: [facility];  Service: Cardiovascular;  Laterality: N/A;   TOTAL ABDOMINAL HYSTERECTOMY W/ BILATERAL SALPINGOOPHORECTOMY  1989   TRANSTHORACIC ECHOCARDIOGRAM  08/29/2012   Moderately calcified annulus of mitral valve, moderate regurg. of both mitral valve and tricuspid valve.      Current Hospital Medications:  Home meds:  No current facility-administered medications on file prior to encounter.   Current Outpatient Medications on File Prior to Encounter  Medication Sig Dispense Refill   acetaminophen (TYLENOL) 325 MG tablet Take 650 mg by mouth every 6 (six) hours as needed for mild pain.      alendronate (FOSAMAX) 70 MG tablet Take 70 mg by mouth every Saturday.     allopurinol (ZYLOPRIM) 100 MG tablet Take 100 mg by mouth daily.     ALPRAZolam (XANAX) 0.5 MG tablet Take 0.25-0.5 mg by mouth at bedtime.     amLODipine (NORVASC) 10 MG tablet TAKE 1 TABLET EVERY DAY (Patient taking differently:  Take 10 mg by mouth daily.) 90 tablet 3   atorvastatin (LIPITOR) 80 MG tablet Take 1 tablet (80 mg total) by mouth daily at 6 PM.     Cholecalciferol (VITAMIN D3) 50 MCG (2000 UT) capsule Take 2,000 Units by mouth daily with lunch.     cloNIDine (CATAPRES) 0.2 MG tablet TAKE 1 TABLET TWICE DAILY (Patient taking differently: Take 0.2 mg by mouth 2 (two) times daily.) 180 tablet 3   Cyanocobalamin (VITAMIN B12) 1000 MCG TBCR Take 1,000 mcg by mouth daily with lunch.     estradiol (ESTRACE) 0.1 MG/GM vaginal cream Place 0.5g nightly for two weeks then twice a week after (Patient taking differently: Place 1 Applicatorful vaginally 3 (three) times a week. Place 0.5g nightly for two weeks then twice a week after) 30 g 11   famotidine (PEPCID) 20 MG tablet Take 1 tablet (20 mg total) by mouth 2 (two) times daily. 60 tablet 3   furosemide (LASIX) 40 MG tablet TAKE 2 TABLETS (80 MG TOTAL) IN MORNING AND 1 AND 1/2 TABLETS (60 MG TOTAL) IN EVENING. CHANGE IN DOSAGE. (Patient taking differently: Take 40-80 mg by mouth See admin instructions. Takes 80 mg in the morning and 40 mg at night.) 315 tablet 0   glipiZIDE (GLUCOTROL) 5 MG tablet Take 1 tablet (5 mg total) by mouth daily. 30 tablet 0  hydrALAZINE (APRESOLINE) 100 MG tablet TAKE 1 TABLET THREE TIMES DAILY (Patient taking differently: Take 100 mg by mouth 3 (three) times daily.) 270 tablet 3   insulin aspart (NOVOLOG) 100 UNIT/ML injection Take 2 units for blood sugar 200-250, 4 units for blood sugar 252-300, 6 units for blood sugar 301-350, 8 units for blood sugar greater than 350 and call MD. (Patient taking differently: Inject 2-8 Units into the skin 3 (three) times daily with meals. Take 2 units for blood sugar 200-250, 4 units for blood sugar 252-300, 6 units for blood sugar 301-350, 8 units for blood sugar greater than 350 and call MD.) 10 mL 11   lactose free nutrition (BOOST) LIQD Take 237 mLs by mouth daily. (To supplement meals)      lidocaine  (XYLOCAINE) 2 % solution Use as directed 15 mLs in the mouth or throat as needed for mouth pain. (Patient taking differently: Use as directed 15 mLs in the mouth or throat daily as needed for mouth pain.) 100 mL 2   magnesium oxide (MAG-OX) 400 (241.3 Mg) MG tablet Take 1 tablet (400 mg total) by mouth daily.     metoprolol succinate (TOPROL-XL) 25 MG 24 hr tablet TAKE 1/2 TABLET EVERY DAY (Patient taking differently: Take 12.5 mg by mouth daily.) 45 tablet 3   mycophenolate (CELLCEPT) 500 MG tablet Take 2 tablets (1,000 mg total) by mouth daily. (Patient taking differently: Take 500 mg by mouth 2 (two) times daily.) 60 tablet 1   ondansetron (ZOFRAN-ODT) 4 MG disintegrating tablet Take 4 mg by mouth every 8 (eight) hours as needed for vomiting or nausea.     pantoprazole (PROTONIX) 40 MG tablet Take 1 tablet (40 mg total) by mouth daily. 30 tablet 1   predniSONE (DELTASONE) 10 MG tablet Take 20 mg by mouth daily with breakfast.     spironolactone (ALDACTONE) 25 MG tablet TAKE 1 TABLET EVERY DAY (Patient taking differently: Take 25 mg by mouth daily.) 90 tablet 3   tadalafil, PAH, (ADCIRCA) 20 MG tablet Take 2 tablets (40 mg total) by mouth daily. (Patient taking differently: Take 40 mg by mouth at bedtime.) 60 tablet 11   TRESIBA FLEXTOUCH 100 UNIT/ML FlexTouch Pen Inject 14 Units into the skin at bedtime.     vitamin C (ASCORBIC ACID) 500 MG tablet Take 500 mg by mouth daily with lunch.     warfarin (COUMADIN) 5 MG tablet Take 1 to 1 &1/2 tablets by mouth daily as directed by the coumadin clinic (Patient taking differently: Take 5-7.5 mg by mouth See admin instructions. 5 mg on Monday,Thursday,Saturday 7.5 mg on Wednesday, Friday and Sunday) 120 tablet 0   Blood Glucose Monitoring Suppl (TRUE METRIX METER) w/Device KIT      glucose blood (TRUE METRIX BLOOD GLUCOSE TEST) test strip Use 1-2 times daily     Insulin Syringe-Needle U-100 (B-D INS SYR ULTRAFINE .3CC/29G) 29G X 1/2" 0.3 ML MISC Use for  sliding scale for breakthrough high blood sugar readings 100 each 1   TRUE METRIX BLOOD GLUCOSE TEST test strip SMARTSIG:Via Meter     vitamin C (ASCORBIC ACID) 500 MG tablet        Scheduled Meds:  ALPRAZolam  0.25 mg Oral QHS   amLODipine  10 mg Oral Daily   atorvastatin  80 mg Oral q1800   cloNIDine  0.2 mg Oral BID   famotidine  20 mg Oral BID   furosemide  40 mg Oral QHS   furosemide  80 mg Oral  Daily   hydrALAZINE  100 mg Oral TID   insulin aspart  0-15 Units Subcutaneous TID WC   insulin aspart  0-5 Units Subcutaneous QHS   insulin glargine-yfgn  14 Units Subcutaneous QHS   magnesium oxide  400 mg Oral Q breakfast   metoprolol succinate  12.5 mg Oral Daily   mycophenolate  500 mg Oral BID   pantoprazole  40 mg Oral Daily   predniSONE  20 mg Oral Q breakfast   spironolactone  25 mg Oral Daily   tadalafil (PAH)  40 mg Oral QHS   warfarin  7.5 mg Oral ONCE-1600   Warfarin - Pharmacist Dosing Inpatient   Does not apply q1600   Continuous Infusions:  piperacillin-tazobactam (ZOSYN)  IV 3.375 g (04/13/22 0929)   PRN Meds:.acetaminophen **OR** acetaminophen, fentaNYL (SUBLIMAZE) injection, oxyCODONE-acetaminophen  Allergies:  Allergies  Allergen Reactions   Flagyl [Metronidazole] Rash    ALL-OVER BODY RASH   Ciprofloxacin     Other reaction(s): Stomach upset - tolerates Other reaction(s): Stomach upset - tolerates   Coreg [Carvedilol] Other (See Comments)    Terrible cramping in the feet and had a lot of bowel movements, but not diarrhea   Diflucan [Fluconazole] Diarrhea   Losartan Swelling    Patient doesn't recall site of swelling   Privigen [Immune Globulin (Human)] Other (See Comments)    Severe/excruciating pain   Sulfamethoxazole-Trimethoprim     Other reaction(s): stomach upset .   Verapamil Hives   Zetia [Ezetimibe] Other (See Comments)    Reaction not recalled   Zocor [Simvastatin - High Dose] Other (See Comments)    Reaction not recalled   Gatifloxacin  Rash    Redness to skin around eye    Family History  Problem Relation Age of Onset   Heart disease Father    Hypertension Father    Hyperlipidemia Father    Breast cancer Neg Hx     Social History:  reports that she has quit smoking. Her smoking use included cigarettes. She has a 30.00 pack-year smoking history. She has never used smokeless tobacco. She reports that she does not drink alcohol and does not use drugs.  ROS: A complete review of systems was performed.  All systems are negative except for pertinent findings as noted.  Physical Exam:  Vital signs in last 24 hours: Temp:  [97.5 F (36.4 C)-98.4 F (36.9 C)] 97.5 F (36.4 C) (07/06 1103) Pulse Rate:  [67-98] 67 (07/06 1103) Resp:  [18-28] 18 (07/06 1103) BP: (112-172)/(42-74) 112/51 (07/06 1103) SpO2:  [97 %-100 %] 98 % (07/06 1103) Weight:  [63.3 kg] 63.3 kg (07/06 1937) Constitutional:  Alert and oriented, No acute distress Cardiovascular: Regular rate and rhythm Respiratory: Normal respiratory effort, Lungs clear bilaterally GI: Abdomen is soft, nontender, nondistended, no abdominal masses GU: No CVA tenderness Neurologic: Grossly intact, no focal deficits Psychiatric: Normal mood and affect  Laboratory Data:  Recent Labs    04/12/22 1149 04/12/22 1915 04/13/22 0534  WBC 8.6  --  6.4  HGB 6.3* 6.8* 8.1*  HCT 19.5* 21.2* 24.3*  PLT 141*  --  112*    Recent Labs    04/12/22 1140 04/13/22 0534  NA 135 137  K 4.7 4.2  CL 106 105  GLUCOSE 212* 127*  BUN 154* 142*  CALCIUM 9.0 8.5*  CREATININE 2.62* 2.51*     Results for orders placed or performed during the hospital encounter of 04/12/22 (from the past 24 hour(s))  CBG monitoring, ED  Status: Abnormal   Collection Time: 04/12/22  5:09 PM  Result Value Ref Range   Glucose-Capillary 339 (H) 70 - 99 mg/dL  Brain natriuretic peptide     Status: None   Collection Time: 04/12/22  5:16 PM  Result Value Ref Range   B Natriuretic Peptide 61.5  0.0 - 100.0 pg/mL  Lactic acid, plasma     Status: None   Collection Time: 04/12/22  5:16 PM  Result Value Ref Range   Lactic Acid, Venous 1.1 0.5 - 1.9 mmol/L  Hemoglobin and hematocrit, blood     Status: Abnormal   Collection Time: 04/12/22  7:15 PM  Result Value Ref Range   Hemoglobin 6.8 (LL) 12.0 - 15.0 g/dL   HCT 21.2 (L) 36.0 - 46.0 %  Lactic acid, plasma     Status: None   Collection Time: 04/12/22  7:40 PM  Result Value Ref Range   Lactic Acid, Venous 1.5 0.5 - 1.9 mmol/L  CBG monitoring, ED     Status: Abnormal   Collection Time: 04/12/22  9:18 PM  Result Value Ref Range   Glucose-Capillary 214 (H) 70 - 99 mg/dL  Prepare RBC (crossmatch)     Status: None   Collection Time: 04/12/22 10:30 PM  Result Value Ref Range   Order Confirmation      ORDER PROCESSED BY BLOOD BANK Performed at [facility], [place] [address] [place], [place] [address]   CBC     Status: Abnormal   Collection Time: 04/13/22  5:34 AM  Result Value Ref Range   WBC 6.4 4.0 - 10.5 K/uL   RBC 2.68 (L) 3.87 - 5.11 MIL/uL   Hemoglobin 8.1 (L) 12.0 - 15.0 g/dL   HCT 24.3 (L) 36.0 - 46.0 %   MCV 90.7 80.0 - 100.0 fL   MCH 30.2 26.0 - 34.0 pg   MCHC 33.3 30.0 - 36.0 g/dL   RDW 17.9 (H) 11.5 - 15.5 %   Platelets 112 (L) 150 - 400 K/uL   nRBC 0.0 0.0 - 0.2 %  Protime-INR     Status: Abnormal   Collection Time: 04/13/22  5:34 AM  Result Value Ref Range   Prothrombin Time 20.1 (H) 11.4 - 15.2 seconds   INR 1.7 (H) 0.8 - 1.2  Comprehensive metabolic panel     Status: Abnormal   Collection Time: 04/13/22  5:34 AM  Result Value Ref Range   Sodium 137 135 - 145 mmol/L   Potassium 4.2 3.5 - 5.1 mmol/L   Chloride 105 98 - 111 mmol/L   CO2 21 (L) 22 - 32 mmol/L   Glucose, Bld 127 (H) 70 - 99 mg/dL   BUN 142 (H) 8 - 23 mg/dL   Creatinine, Ser 2.51 (H) 0.44 - 1.00 mg/dL   Calcium 8.5 (L) 8.9 - 10.3 mg/dL   Total Protein 4.9 (L) 6.5 - 8.1 g/dL   Albumin 2.7 (L) 3.5 - 5.0 g/dL   AST 21 15  - 41 U/L   ALT 18 0 - 44 U/L   Alkaline Phosphatase 36 (L) 38 - 126 U/L   Total Bilirubin 1.1 0.3 - 1.2 mg/dL   GFR, Estimated 19 (L) >60 mL/min   Anion gap 11 5 - 15  Glucose, capillary     Status: Abnormal   Collection Time: 04/13/22  7:43 AM  Result Value Ref Range   Glucose-Capillary 227 (H) 70 - 99 mg/dL  BLOOD TRANSFUSION REPORT - SCANNED     Status:  None   Collection Time: 04/13/22 10:34 AM   Narrative   Ordered by an unspecified provider.  Glucose, capillary     Status: Abnormal   Collection Time: 04/13/22 11:29 AM  Result Value Ref Range   Glucose-Capillary 215 (H) 70 - 99 mg/dL   *Note: Due to a large number of results and/or encounters for the requested time period, some results have not been displayed. A complete set of results can be found in Results Review.   No results found for this or any previous visit (from the past 240 hour(s)).  Renal Function: Recent Labs    04/07/22 1127 04/10/22 1310 04/12/22 1140 04/13/22 0534  CREATININE 2.48* 2.55* 2.62* 2.51*   Estimated Creatinine Clearance: 13.7 mL/min (A) (by C-G formula based on SCr of 2.51 mg/dL (H)).  Radiologic Imaging: CT ABDOMEN PELVIS WO CONTRAST  Result Date: 04/12/2022 CLINICAL DATA:  A female at age 52 presents with acute abdominal pain. EXAM: CT ABDOMEN AND PELVIS WITHOUT CONTRAST TECHNIQUE: Multidetector CT imaging of the abdomen and pelvis was performed following the standard protocol without IV contrast. RADIATION DOSE REDUCTION: This exam was performed according to the departmental dose-optimization program which includes automated exposure control, adjustment of the mA and/or kV according to patient size and/or use of iterative reconstruction technique. COMPARISON:  Feb 20, 2022. FINDINGS: Lower chest: Calcified coronary artery disease. Signs of prior granulomatous disease. Hepatobiliary: Liver with smooth contours. Post cholecystectomy. No gross biliary duct distension. Pancreas: No signs of  pancreatic inflammation.  Pancreatic atrophy. Spleen: Granulomatous changes in the spleen. Adrenals/Urinary Tract: Renal vascular calcifications. No perinephric stranding, no hydronephrosis or obstructing renal calculus. Normal adrenal glands. Gas throughout the urinary bladder at the periphery. Gas within the urinary bladder and about the urinary bladder is slightly increased from previous imaging. Density of material in the urinary bladder otherwise with simple fluid density. Towards the base of the urinary bladder is difficult to exclude the possibility of emphysematous cystitis based on the relationship of small gas locules of the wall of the urinary bladder. There is no substantial perivesical stranding. Stomach/Bowel: Small hiatal hernia. Small duodenal diverticulum. No acute small bowel process. The appendix is not visible though there are no secondary signs to suggest acute appendicitis. Colon is largely collapsed. There are signs of acute diverticulitis in the LEFT lower quadrant. Posterior diverticulum on image 48/2 with some mild surrounding stranding. Diverticulosis and diverticular disease throughout the sigmoid is moderate to marked and otherwise unchanged. Vascular/Lymphatic: Calcified aortic atherosclerosis. No aneurysmal dilation of the abdominal aorta. Signs of prior stenting of the LEFT common iliac artery. No adenopathy in either the abdomen or in the pelvis. Reproductive: Post hysterectomy without adnexal mass. Other: No free air or ascites. Musculoskeletal: Osteopenia. No acute bone finding or destructive bone process. Spinal degenerative changes. Redemonstration of compression fracture at L4 with similar appearance. IMPRESSION: 1. Emphysematous cystitis but without substantial surrounding stranding or bladder wall thickening. Consider correlation with urinalysis and urologic and or ID consultation for further management as this can be life-threatening. Close follow-up is suggested. 2. Acute  diverticulitis of the sigmoid colon. 3. Gas in the urinary bladder is increased since previous imaging. 4. Aortic atherosclerosis and coronary artery disease. 5. Similar appearance of L4 compression fracture. Aortic Atherosclerosis (ICD10-I70.0) and Emphysema (ICD10-J43.9). These results were called by telephone at the time of interpretation on 04/12/2022 at 1:14 pm to provider Davonna Belling , who verbally acknowledged these results. Electronically Signed   By: Zetta Bills M.D.   On:  04/12/2022 13:19    I independently reviewed the above imaging studies.  Procedure note (foley catheter): Under sterile conditions, I placed 16 French Foley catheter through meatus without difficulty with return of clear yellow urine.  Impression/Recommendation:  #1.  Emphysematous cystitis: CT A/P 04/12/2022 with emphysematous cystitis and increased air compared to prior study in 02/2022.  Urinalysis with negative nitrates, small leukocyte esterase.  Urine culture pending.  On Zosyn. 2.  Possible colovesicular fistula: CT A/P 02/2022 with air present within the bladder with extensive diverticulosis suggestive of colovesicular fistula. 3.  Possible vesicovaginal fistula: CT A/P 02/2022 with air present within the vaginal vault.  She does complain of incontinence when she stands.  -I recommend broad-spectrum antibiotics.  Follow-up urine culture. -Foley catheter placed without difficulty.  I recommend leaving Foley catheter in place for at least 1 week.  If she develops fevers or worsening pain, would recommend rescan. -I reviewed her prior CT in 02/2022 with possible colovesicular fistula or vesicovaginal fistula.  We discussed this management typically involves bowel resection and consultation with general surgery.  Recommend outpatient follow-up with general surgery to evaluate for possible colovesicular fistula.   -I will notify Dr. Matilde Sprang of her admission  Matt R. Agustine Rossitto MD 04/13/2022, 2:53 PM  [facility]   Pager: [phone]

## 2022-04-13 NOTE — Assessment & Plan Note (Signed)
In the presence of [employer] mechanical valve.  Rate controlled -Continue warfarin - Continue metoprolol, clonidine

## 2022-04-13 NOTE — Assessment & Plan Note (Addendum)
Blood pressure labile but mostly elevated - Continue amlodipine, clonidine, furosemide, hydralazine, metoprolol, spironolactone

## 2022-04-13 NOTE — Progress Notes (Incomplete)
[place] for warfarin Indication: PAF, mech AVR  Allergies  Allergen Reactions   Flagyl [Metronidazole] Rash    ALL-OVER BODY RASH   Ciprofloxacin     Other reaction(s): Stomach upset - tolerates Other reaction(s): Stomach upset - tolerates   Coreg [Carvedilol] Other (See Comments)    Terrible cramping in the feet and had a lot of bowel movements, but not diarrhea   Diflucan [Fluconazole] Diarrhea   Losartan Swelling    Patient doesn't recall site of swelling   Privigen [Immune Globulin (Human)] Other (See Comments)    Severe/excruciating pain   Sulfamethoxazole-Trimethoprim     Other reaction(s): stomach upset .   Verapamil Hives   Zetia [Ezetimibe] Other (See Comments)    Reaction not recalled   Zocor [Simvastatin - High Dose] Other (See Comments)    Reaction not recalled   Gatifloxacin Rash    Redness to skin around eye    Patient Measurements: Height: _0  (149.9 cm) Weight: 63.3 kg (139 lb 8.8 oz) IBW/kg (Calculated) : 43.2  Vital Signs: Temp: 97.7 F (36.5 C) (07/06 0638) Temp Source: Oral (07/06 0638) BP: 147/50 (07/06 0638) Pulse Rate: 80 (07/06 0638)  Labs: Recent Labs    04/10/22 1310 04/10/22 1311 04/12/22 1140 04/12/22 1149 04/12/22 1915 04/13/22 0534  HGB 6.5*  --   --  6.3* 6.8* 8.1*  HCT 19.4*  --   --  19.5* 21.2* 24.3*  PLT 125*  --   --  141*  --  112*  LABPROT  --  33.3*  --  21.5*  --  20.1*  INR  --  3.3*  --  1.9*  --  1.7*  CREATININE 2.55*  --  2.62*  --   --  2.51*     Estimated Creatinine Clearance: 13.7 mL/min (A) (by C-G formula based on SCr of 2.51 mg/dL (H)).   Medical History: Past Medical History:  Diagnosis Date   Aortic atherosclerosis ([place]) 01/23/2018   Atrial fibrillation (HCC)    Benign positional vertigo 10/06/2011   Carotid artery disease ([place])    Chemotherapy induced neutropenia ([place]) 07/21/2019   Cholelithiasis 01/23/2018   Cholelithiasis 01/23/2018   Chronic  anticoagulation    Coronary artery disease    status post coronary artery bypass grafting times 07/10/2004   Diabetes mellitus    Hypercholesteremia    Hypertension    Mechanical heart valve present    H. aortic valve replacement at the time of bypass surgery October 2005   Moderate to severe pulmonary hypertension ([place])    Peripheral arterial disease ([place])    history of left common iliac artery PTA and stenting for a chronic total occlusion 08/26/01    Medications:  Medications Prior to Admission  Medication Sig Dispense Refill Last Dose   acetaminophen (TYLENOL) 325 MG tablet Take 650 mg by mouth every 6 (six) hours as needed for mild pain.    04/09/2022   alendronate (FOSAMAX) 70 MG tablet Take 70 mg by mouth every Saturday.   03/22/2022   allopurinol (ZYLOPRIM) 100 MG tablet Take 100 mg by mouth daily.   04/12/2022   ALPRAZolam (XANAX) 0.5 MG tablet Take 0.25-0.5 mg by mouth at bedtime.   04/11/2022   amLODipine (NORVASC) 10 MG tablet TAKE 1 TABLET EVERY DAY (Patient taking differently: Take 10 mg by mouth daily.) 90 tablet 3 04/12/2022   atorvastatin (LIPITOR) 80 MG tablet Take 1 tablet (80 mg total) by mouth daily at 6 PM.  04/11/2022   Cholecalciferol (VITAMIN D3) 50 MCG (2000 UT) capsule Take 2,000 Units by mouth daily with lunch.   04/11/2022   cloNIDine (CATAPRES) 0.2 MG tablet TAKE 1 TABLET TWICE DAILY (Patient taking differently: Take 0.2 mg by mouth 2 (two) times daily.) 180 tablet 3 04/12/2022   Cyanocobalamin (VITAMIN B12) 1000 MCG TBCR Take 1,000 mcg by mouth daily with lunch.   04/11/2022   estradiol (ESTRACE) 0.1 MG/GM vaginal cream Place 0.5g nightly for two weeks then twice a week after (Patient taking differently: Place 1 Applicatorful vaginally 3 (three) times a week. Place 0.5g nightly for two weeks then twice a week after) 30 g 11 03/29/2022   famotidine (PEPCID) 20 MG tablet Take 1 tablet (20 mg total) by mouth 2 (two) times daily. 60 tablet 3 04/12/2022   furosemide (LASIX) 40 MG  tablet TAKE 2 TABLETS (80 MG TOTAL) IN MORNING AND 1 AND 1/2 TABLETS (60 MG TOTAL) IN EVENING. CHANGE IN DOSAGE. (Patient taking differently: Take 40-80 mg by mouth See admin instructions. Takes 80 mg in the morning and 40 mg at night.) 315 tablet 0 04/12/2022   glipiZIDE (GLUCOTROL) 5 MG tablet Take 1 tablet (5 mg total) by mouth daily. 30 tablet 0 unk last dose   hydrALAZINE (APRESOLINE) 100 MG tablet TAKE 1 TABLET THREE TIMES DAILY (Patient taking differently: Take 100 mg by mouth 3 (three) times daily.) 270 tablet 3 04/12/2022   insulin aspart (NOVOLOG) 100 UNIT/ML injection Take 2 units for blood sugar 200-250, 4 units for blood sugar 252-300, 6 units for blood sugar 301-350, 8 units for blood sugar greater than 350 and call MD. (Patient taking differently: Inject 2-8 Units into the skin 3 (three) times daily with meals. Take 2 units for blood sugar 200-250, 4 units for blood sugar 252-300, 6 units for blood sugar 301-350, 8 units for blood sugar greater than 350 and call MD.) 10 mL 11 04/12/2022   lactose free nutrition (BOOST) LIQD Take 237 mLs by mouth daily. (To supplement meals)    04/12/2022   lidocaine (XYLOCAINE) 2 % solution Use as directed 15 mLs in the mouth or throat as needed for mouth pain. (Patient taking differently: Use as directed 15 mLs in the mouth or throat daily as needed for mouth pain.) 100 mL 2 04/08/2022   magnesium oxide (MAG-OX) 400 (241.3 Mg) MG tablet Take 1 tablet (400 mg total) by mouth daily.   04/11/2022   metoprolol succinate (TOPROL-XL) 25 MG 24 hr tablet TAKE 1/2 TABLET EVERY DAY (Patient taking differently: Take 12.5 mg by mouth daily.) 45 tablet 3 04/12/2022 at 9 am   mycophenolate (CELLCEPT) 500 MG tablet Take 2 tablets (1,000 mg total) by mouth daily. (Patient taking differently: Take 500 mg by mouth 2 (two) times daily.) 60 tablet 1 04/12/2022   ondansetron (ZOFRAN-ODT) 4 MG disintegrating tablet Take 4 mg by mouth every 8 (eight) hours as needed for vomiting or nausea.    04/12/2022   pantoprazole (PROTONIX) 40 MG tablet Take 1 tablet (40 mg total) by mouth daily. 30 tablet 1 04/11/2022   predniSONE (DELTASONE) 10 MG tablet Take 20 mg by mouth daily with breakfast.   04/12/2022   spironolactone (ALDACTONE) 25 MG tablet TAKE 1 TABLET EVERY DAY (Patient taking differently: Take 25 mg by mouth daily.) 90 tablet 3 04/12/2022   tadalafil, PAH, (ADCIRCA) 20 MG tablet Take 2 tablets (40 mg total) by mouth daily. (Patient taking differently: Take 40 mg by mouth at bedtime.) 60 tablet  11 04/11/2022   TRESIBA FLEXTOUCH 100 UNIT/ML FlexTouch Pen Inject 14 Units into the skin at bedtime.   04/11/2022   vitamin C (ASCORBIC ACID) 500 MG tablet Take 500 mg by mouth daily with lunch.   04/11/2022   warfarin (COUMADIN) 5 MG tablet Take 1 to 1 &1/2 tablets by mouth daily as directed by the coumadin clinic (Patient taking differently: Take 5-7.5 mg by mouth See admin instructions. 5 mg on Monday,Thursday,Saturday 7.5 mg on Wednesday, Friday and Sunday) 120 tablet 0 04/11/2022 at 5.30 pm   Blood Glucose Monitoring Suppl (TRUE METRIX METER) w/Device KIT       glucose blood (TRUE METRIX BLOOD GLUCOSE TEST) test strip Use 1-2 times daily      Insulin Syringe-Needle U-100 (B-D INS SYR ULTRAFINE .3CC/29G) 29G X 1/2" 0.3 ML MISC Use for sliding scale for breakthrough high blood sugar readings 100 each 1    TRUE METRIX BLOOD GLUCOSE TEST test strip SMARTSIG:Via Meter      vitamin C (ASCORBIC ACID) 500 MG tablet       Scheduled:   ALPRAZolam  0.25 mg Oral QHS   amLODipine  10 mg Oral Daily   atorvastatin  80 mg Oral q1800   cloNIDine  0.2 mg Oral BID   famotidine  20 mg Oral BID   furosemide  40 mg Oral QHS   furosemide  80 mg Oral Daily   hydrALAZINE  100 mg Oral TID   insulin aspart  0-15 Units Subcutaneous TID WC   insulin aspart  0-5 Units Subcutaneous QHS   insulin glargine-yfgn  14 Units Subcutaneous QHS   magnesium oxide  400 mg Oral Q breakfast   metoprolol succinate  12.5 mg Oral Daily    mycophenolate  500 mg Oral BID   pantoprazole  40 mg Oral Daily   predniSONE  20 mg Oral Q breakfast   spironolactone  25 mg Oral Daily   tadalafil (PAH)  40 mg Oral QHS   Warfarin - Pharmacist Dosing Inpatient   Does not apply q1600   PRN: acetaminophen **OR** acetaminophen, fentaNYL (SUBLIMAZE) injection, oxyCODONE-acetaminophen  Assessment: 74 yoF with PMH PAF & mechanical AVR on warfarin, CAD s/p CABG 2005, PAH, DM, HTN, dCHF, vasculitis, autoimmune hemolytic anemia, admitted 7/5 for diverticulitis flare. Pharmacy consulted to continue warfarin while admitted. Received PRBC on 7/3 for Hgb 6.5.  Baseline INR slightly subtherapeutic Prior anticoagulation: warfarin 5 mg daily except 7.5 mg Wed, Fri, Sun; LD 7/4  Significant events:  Today, 04/13/2022: CBC: Hgb low d/t hemolytic anemia; another PRBC ordered x 1 INR slightly subtherapeutic Major drug interactions: none; broad spectrum abx (Zosyn) can increase warfarin sensitivity No bleeding issues per nursing CLD ordered  Goal of Therapy: INR 2-3 (confirmed with clinic records)  Plan: Warfarin 7.5 mg PO tonight x 1; Hgb low but no active bleeding Daily INR CBC daily while Hgb < 7 Monitor for signs of bleeding or thrombosis   Royetta Asal, PharmD, BCPS 04/13/2022 9:30 AM

## 2022-04-13 NOTE — Assessment & Plan Note (Signed)
-   Continue tadalafil

## 2022-04-13 NOTE — Assessment & Plan Note (Addendum)
I am uncertain to the degree which this is the place.  Her abdominal pain is diffuse, diverticulitis is not pronounced on imaging.  Regardless it is covered by the antibiotics for cystitis. - Clears for 1 more day, advance diet tomorrow

## 2022-04-13 NOTE — TOC Initial Note (Signed)
Transition of Care [facility]) - Initial/Assessment Note    Patient Details  Name: [name] MRN: [id number] Date of Birth: June 29, 1941  [facility]) CM/SW Contact:    Leeroy Cha, RN Phone Number: 04/13/2022, 11:15 AM  Clinical Narrative:                  Transition of Care [facility]) Screening Note   Patient Details  Name: [name] Date of Birth: 09/08/1941   [facility]) CM/SW Contact:    Leeroy Cha, RN Phone Number: 04/13/2022, 11:16 AM    Transition of Care Department [facility]) has reviewed patient and no TOC needs have been identified at this time. We will continue to monitor patient advancement through interdisciplinary progression rounds. If new patient transition needs arise, please place a TOC consult.    Expected Discharge Plan: Home/Self Care Barriers to Discharge: Continued Medical Work up   Patient Goals and CMS Choice Patient states their goals for this hospitalization and ongoing recovery are:: to go home CMS Medicare.gov Compare Post Acute Care list provided to:: Patient Choice offered to / list presented to : Patient  Expected Discharge Plan and Services Expected Discharge Plan: Home/Self Care   Discharge Planning Services: CM Consult   Living arrangements for the past 2 months: Single Family Home                                      Prior Living Arrangements/Services Living arrangements for the past 2 months: Single Family Home Lives with:: Self Patient language and need for interpreter reviewed:: Yes Do you feel safe going back to the place where you live?: Yes            Criminal Activity/Legal Involvement Pertinent to Current Situation/Hospitalization: No - Comment as needed  Activities of Daily Living      Permission Sought/Granted                  Emotional Assessment Appearance:: Appears stated age     Orientation: : Oriented to Situation, Oriented to  Time, Oriented to Place,  Oriented to Self Alcohol / Substance Use: Not Applicable Psych Involvement: No (comment)  Admission diagnosis:  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.80] Diverticulitis [K57.92] Autoimmune hemolytic anemia ([place]) [D59.10] Acute diverticulitis [K57.92] Patient Active Problem List   Diagnosis Date Noted   Emphysematous cystitis 04/13/2022   Female bladder prolapse 04/13/2022   Acute diverticulitis 04/12/2022   Hyperkalemia 02/04/2022   Acute renal failure superimposed on stage 3b chronic kidney disease ([place]) 02/04/2022   Hyperglycemia 01/09/2022   Autoimmune hemolytic anemia ([place]) 01/02/2022   Bilateral lower extremity edema 01/02/2022   Refractory anemia ([place]) 12/15/2021   Abnormal chest x-ray 12/15/2021   Subtherapeutic international normalized ratio (INR) 12/15/2021   Elevated troponin 12/15/2021   Unilateral primary osteoarthritis, right hip 05/31/2021   Midline cystocele 05/30/2021   Primary localized osteoarthritis of pelvic region and thigh 05/30/2021   Vaginal pain 05/30/2021   Asymmetric SNHL (sensorineural hearing loss) 01/06/2021   Referred otalgia of both ears 01/06/2021   Bilateral hearing loss 08/31/2020   Low back pain 02/20/2020   CKD (chronic kidney disease), stage IV ([place]) 10/13/2019   Leukopenia due to antineoplastic chemotherapy ([place]) 08/18/2019   Thrombophilia ([place]) 06/19/2019   Encounter for general adult medical examination without abnormal findings 06/13/2019   Arteritis ([place]) 06/06/2019   Hypertensive heart and chronic kidney disease  with heart failure and stage 1 through stage 4 chronic kidney disease, or unspecified chronic kidney disease ([place]) 06/06/2019   Muscle weakness 06/06/2019   Pancytopenia ([place]) 05/28/2019   Dyspnea 05/28/2019   Vasculitis, ANCA positive 05/28/2019   Disorder of connective tissue ([place]) 05/14/2019   Renal insufficiency 05/08/2019   Acute on chronic diastolic CHF (congestive heart failure) ([place]) 04/07/2019   Left leg cellulitis  04/07/2019   PAF (paroxysmal atrial fibrillation) ([place]) 04/07/2019   Iron deficiency anemia 02/28/2019   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 ([place]) 02/28/2019   Diabetic retinopathy associated with type 2 diabetes mellitus ([place]) 09/02/2018   Chronic pain 06/19/2018   Non-thrombocytopenic purpura ([place]) 05/21/2018   Gout 03/22/2018   Malnutrition of moderate degree 03/12/2018   Diabetes mellitus type 2, controlled ([place]) 03/06/2018   S/P cholecystectomy 03/06/2018   H/O mechanical aortic valve replacement 03/06/2018   Moderate to severe pulmonary hypertension ([place]) 03/06/2018   GERD (gastroesophageal reflux disease) 02/21/2018   Anxiety 02/21/2018   Chronic diastolic CHF (congestive heart failure) ([place]) 02/21/2018   Diarrhea 02/21/2018   General weakness 02/02/2018   Thrombocytopenia ([place]) 01/25/2018   Arm pain, diffuse 01/25/2018   Anemia 01/23/2018   Aortic atherosclerosis ([place]) 01/23/2018   GI bleed 01/18/2018   Age-related osteoporosis without current pathological fracture 05/24/2017   Carotid artery occlusion 05/24/2017   Generalized anxiety disorder 05/24/2017   Hearing loss 05/24/2017   Presence of prosthetic heart valve 05/24/2017   Carotid artery disease ([place]) 08/18/2013   Peripheral arterial disease ([place]) 08/18/2013   Diabetes mellitus without complication ([place]) 21/30/8657   Hyponatremia 10/06/2011   Hypertension    Hypercholesteremia    Mechanical heart valve present    Chronic anticoagulation    PCP:  [name], MD Pharmacy:   [id number] [id number] - [address], [place] [address] Phone: [phone] Fax: [phone]     Social Determinants of Health (SDOH) Interventions    Readmission Risk Interventions     No data to display

## 2022-04-13 NOTE — Progress Notes (Addendum)
[facility] HEMATOLOGY-ONCOLOGY PROGRESS NOTE  ASSESSMENT AND PLAN:  [name] is a 81 y.o. female with multifactorial anemia, previously treated with rituximab for ANCA positive hemolytic anemia, previously on maintenance rituximab every 12 weeks presented with worsening anemia thought to be related to her hemolysis.  She was diagnosed with elevated ANCA titers 1 is to 640 and previously treated for ANCA positive hemolytic anemia, seen by rheumatology Dr. Salome [name] back in 2020, responded at that time to prednisone and rituximab and continued on rituximab maintenance.  However her rituximab was changed to every 11-monthinfusions and she relapsed before her second cycle of every 342-monthituximab.   She is currently receiving treatment with mycophenolate 1000 mg p.o. daily and prednisone 20 mg daily.  We have been unsuccessful in tapering her prednisone.  She receives PRBC transfusions for hemoglobin less than 8.  Now admitted due to to emphysematous cystitis and possible diverticulitis.  On IV antibiotics and urology has been consulted.  She is currently receiving a clear liquid diet.   # Multifactorial Anemia:  --Bone marrow biopsy from 02/20/2022 showed normocellular marrow with trilineage hematopoiesis. No leukemia, lymphoma or high-grade dysplasia identified. --CT CAP from 02/20/2022 did not show any underlying cause for anemia.  --ANCA titers are normal when checked on 02/10/2022 --Currently on prednisone 20 mg p.o. daily and mycophenolate mofetil to 1000 mg PO daily.  Recommend for her to continue these medications. --She required 2 units PRBCs this admission.  Recommend PRBC transfusion if hemoglobin is less than 8. --[facility] and is currently scheduled at the end of September.  #Emphysematous cystitis --Case has been discussed with urology who recommended Foley catheter and antibiotics --Urology consult pending.  #Acute  diverticulitis --Antibiotics per primary team --On clear liquid diet-advance per primary.  KrMikey [name] SUBJECTIVE: Ms. [name] well-known to our service.  We follow her for autoimmune hemolytic anemia.  She was last seen in our office on 04/07/2022.  She currently receives transfusion support in our office and is on mycophenolate 1000 mg daily.  She is also on prednisone 20 mg daily.  Admitted to the hospital with emphysematous cystitis and possible diverticulitis.  Received 2 units PRBCs so far this admission.  This morning, she reports abdominal discomfort.  She reported that she had diarrhea prior to admission but none since.  She is not having any nausea or vomiting this morning.  Has ongoing lower extremity edema which she states is painful.  She is hoping to get out of bed today.  No bleeding reported.  SUMMARY OF HEMATOLOGIC HISTORY: [name] a [age].o. [name] with multifactorial anemia, as follows:             (a) anemia of chronic inflammation: As of May 2020 she has a SED rate of 95, CRP 1.1, positive ANA and RF; subsequently she was found to be ANCA positive             (b) hemolysis (mild): she has a mildly elevated LDH and a DAT positive for complement, not IgG; this is c/w (a) above             (c) anemia of renal insufficiency: inadequate EPO resonse to anemia                         (d) GIB/ iron deficiency in patient on lifelong anticoagulaton   (1) Feraheme received 03/01/2019,  repeated 03/20/2019              (a) reticulocyte 03/07/2019 up from 43.4 a year ago to 191.8 after iron infusion             (b) feraheme repeated on 05/12/2019             (c) subsequent ferritin levels >100   (2) darbepoetin: starting 05/05/2019 at 197mg given every 2 weeks             (a) dose increased to 200 mcg every week starting 07/01/2019              (b) back to every 2 weeks beginning 08/04/2019 still at 200 mcg dose             (c) switched to Retacrit every 4  weeks as of 08/18/2019   (3) ANCA positive vasculitis: diagnosed 05/08/2019 (titer 1:640)             (a) prednisone 40 mg daily started 05/31/2019             (b) rituximab weekly started 06/12/2019 (received test dose 06/05/2019)                         (i) hepatitis B sAg, core Ab and HIV negative 05/08/2019                         (ii) rituximab held after 07/14/2019 dose (received 5 weekly doses) with neutropenia                         (iii) rituximab resumed 08/18/2019 but changed to monthly                         (iv) rituximab changed to every 2 months beginning with 02/02/2020 dose                         (v) rituximab changed to every 12 weeks after the 10/12/2020 dose             (c) prednisone tapered to off as of 09/15/2019   (4) osteopenia with a T score of -1.5 on bone density 02/28/2016 at the [facility]  REVIEW OF SYSTEMS:   Review of Systems  Constitutional:  Positive for malaise/fatigue. Negative for chills and fever.  Respiratory:  Negative for cough and shortness of breath.   Cardiovascular:  Positive for leg swelling. Negative for chest pain.  Gastrointestinal:  Positive for abdominal pain. Negative for nausea and vomiting.  Skin: Negative.   Neurological: Negative.     I have reviewed the past medical history, past surgical history, social history and family history with the patient and they are unchanged from previous note.   PHYSICAL EXAMINATION:  Vitals:   04/13/22 0620 04/13/22 0638  BP:  (!) 147/50  Pulse:  80  Resp:  20  Temp: 98 F (36.7 C) 97.7 F (36.5 C)  SpO2:  100%   Filed Weights   04/12/22 1133 04/13/22 0638  Weight: 63 kg 63.3 kg    Intake/Output from previous day: 07/05 0701 - 07/06 0700 In: 1757 [Blood:707; IV Piggyback:1050] Out: 2000 [Urine:2000]  Physical Exam Vitals reviewed.  Constitutional:      General: She is not in acute distress. HENT:     Head: Normocephalic.  Eyes:  General: No scleral  icterus. Cardiovascular:     Rate and Rhythm: Normal rate.  Pulmonary:     Effort: Pulmonary effort is normal. No respiratory distress.  Abdominal:     Tenderness: There is abdominal tenderness.  Musculoskeletal:     Right lower leg: Edema present.     Left lower leg: Edema present.  Skin:    General: Skin is warm and dry.  Neurological:     Mental Status: She is alert and oriented to person, place, and time.     LABORATORY DATA:  I have reviewed the data as listed    Latest Ref Rng & Units 04/13/2022    5:34 AM 04/12/2022   11:40 AM 04/10/2022    1:10 PM  CMP  Glucose 70 - 99 mg/dL 127  212  335   BUN 8 - 23 mg/dL 142  154  135   Creatinine 0.44 - 1.00 mg/dL 2.51  2.62  2.55   Sodium 135 - 145 mmol/L 137  135  131   Potassium 3.5 - 5.1 mmol/L 4.2  4.7  5.4   Chloride 98 - 111 mmol/L 105  106  101   CO2 22 - 32 mmol/L _0 Calcium 8.9 - 10.3 mg/dL 8.5  9.0  8.8   Total Protein 6.5 - 8.1 g/dL 4.9  5.2  5.3   Total Bilirubin 0.3 - 1.2 mg/dL 1.1  0.9  0.5   Alkaline Phos 38 - 126 U/L 36  42  54   AST 15 - 41 U/L _1 ALT 0 - 44 U/L _2 Lab Results  Component Value Date   WBC 6.4 04/13/2022   HGB 8.1 (L) 04/13/2022   HCT 24.3 (L) 04/13/2022   MCV 90.7 04/13/2022   PLT 112 (L) 04/13/2022   NEUTROABS 7.4 04/12/2022    No results found for: "CEA1", "CEA", "PYY511", "CA125", "PSA1"  CT ABDOMEN PELVIS WO CONTRAST  Result Date: 04/12/2022 CLINICAL DATA:  A female at age 43 presents with acute abdominal pain. EXAM: CT ABDOMEN AND PELVIS WITHOUT CONTRAST TECHNIQUE: Multidetector CT imaging of the abdomen and pelvis was performed following the standard protocol without IV contrast. RADIATION DOSE REDUCTION: This exam was performed according to the departmental dose-optimization program which includes automated exposure control, adjustment of the mA and/or kV according to patient size and/or use of iterative reconstruction technique. COMPARISON:  Feb 20, 2022. FINDINGS: Lower chest: Calcified coronary artery disease. Signs of prior granulomatous disease. Hepatobiliary: Liver with smooth contours. Post cholecystectomy. No gross biliary duct distension. Pancreas: No signs of pancreatic inflammation.  Pancreatic atrophy. Spleen: Granulomatous changes in the spleen. Adrenals/Urinary Tract: Renal vascular calcifications. No perinephric stranding, no hydronephrosis or obstructing renal calculus. Normal adrenal glands. Gas throughout the urinary bladder at the periphery. Gas within the urinary bladder and about the urinary bladder is slightly increased from previous imaging. Density of material in the urinary bladder otherwise with simple fluid density. Towards the base of the urinary bladder is difficult to exclude the possibility of emphysematous cystitis based on the relationship of small gas locules of the wall of the urinary bladder. There is no substantial perivesical stranding. Stomach/Bowel: Small hiatal hernia. Small duodenal diverticulum. No acute small bowel process. The appendix is not visible though there are no secondary signs to suggest acute appendicitis. Colon is largely collapsed. There are signs of acute diverticulitis in the LEFT  lower quadrant. Posterior diverticulum on image 48/2 with some mild surrounding stranding. Diverticulosis and diverticular disease throughout the sigmoid is moderate to marked and otherwise unchanged. Vascular/Lymphatic: Calcified aortic atherosclerosis. No aneurysmal dilation of the abdominal aorta. Signs of prior stenting of the LEFT common iliac artery. No adenopathy in either the abdomen or in the pelvis. Reproductive: Post hysterectomy without adnexal mass. Other: No free air or ascites. Musculoskeletal: Osteopenia. No acute bone finding or destructive bone process. Spinal degenerative changes. Redemonstration of compression fracture at L4 with similar appearance. IMPRESSION: 1. Emphysematous cystitis but without  substantial surrounding stranding or bladder wall thickening. Consider correlation with urinalysis and urologic and or ID consultation for further management as this can be life-threatening. Close follow-up is suggested. 2. Acute diverticulitis of the sigmoid colon. 3. Gas in the urinary bladder is increased since previous imaging. 4. Aortic atherosclerosis and coronary artery disease. 5. Similar appearance of L4 compression fracture. Aortic Atherosclerosis (ICD10-I70.0) and Emphysema (ICD10-J43.9). These results were called by telephone at the time of interpretation on 04/12/2022 at 1:14 pm to provider Davonna Belling , who verbally acknowledged these results. Electronically Signed   By: Zetta Bills M.D.   On: 04/12/2022 13:19     Future Appointments  Date Time Provider [place]  04/14/2022 11:15 AM [facility] LAB [facility] None  04/14/2022 11:45 AM Gardenia Phlegm, NP [facility] None  04/14/2022 12:15 PM [facility] INFUSION [facility] None  04/17/2022 12:15 PM [facility] LAB [facility] None  04/17/2022  1:00 PM [facility] INFUSION [facility] None  04/21/2022 11:15 AM [facility] LAB [facility] None  04/21/2022 11:45 AM Causey, Charlestine Massed, NP [facility] None  04/21/2022 12:30 PM [facility] INFUSION [facility] None  04/24/2022 12:15 PM [facility] LAB [facility] None  04/24/2022  1:15 PM [facility] INFUSION [facility] None  04/26/2022  9:00 AM Jon Billings, RN [facility] None  05/10/2022 11:20 AM Jaquita Folds, MD [facility]  05/16/2022 11:15 AM [id number]  06/09/2022  9:30 AM Marzetta Board, DPM [facility]      LOS: 1 day   Attending Note  I personally saw the patient, reviewed the chart and examined the patient. The plan of care was discussed with the patient and the admitting team. I agree with the assessment and plan as documented above. Thank you very much for the consultation. This is a very well-known  patient to me who follows up regularly at the hematology clinic for her autoimmune hemolytic anemia.  She appears to have been admitted with diverticulitis and cystitis.  She is doing better today.  Her abdominal pain has improved.  She appears to have had significant amount of improvement from edema standpoint.  She continues on mycophenolate and prednisone for management of hemolytic anemia.  She continues on GI prophylaxis.  I once again discussed today about long-term goals of care.  She has several underlying comorbidities.  She understands that if her quality of life continues to deteriorate, it may not be unreasonable to consider symptomatic management alone.  She would like to continue thinking about her goals of care. Please consider transfusing 1 unit of packed RBC if hemoglobin is less than 8. We will continue to monitor periodically

## 2022-04-13 NOTE — Progress Notes (Signed)
[facility] for warfarin Indication: PAF, mech AVR  Allergies  Allergen Reactions   Flagyl [Metronidazole] Rash    ALL-OVER BODY RASH   Ciprofloxacin     Other reaction(s): Stomach upset - tolerates Other reaction(s): Stomach upset - tolerates   Coreg [Carvedilol] Other (See Comments)    Terrible cramping in the feet and had a lot of bowel movements, but not diarrhea   Diflucan [Fluconazole] Diarrhea   Losartan Swelling    Patient doesn't recall site of swelling   Privigen [Immune Globulin (Human)] Other (See Comments)    Severe/excruciating pain   Sulfamethoxazole-Trimethoprim     Other reaction(s): stomach upset .   Verapamil Hives   Zetia [Ezetimibe] Other (See Comments)    Reaction not recalled   Zocor [Simvastatin - High Dose] Other (See Comments)    Reaction not recalled   Gatifloxacin Rash    Redness to skin around eye    Patient Measurements: Height: '4\' 11"'  (149.9 cm) Weight: 63.3 kg (139 lb 8.8 oz) IBW/kg (Calculated) : 43.2  Vital Signs: Temp: 97.5 F (36.4 C) (07/06 1103) Temp Source: Oral (07/06 1103) BP: 112/51 (07/06 1103) Pulse Rate: 67 (07/06 1103)  Labs: Recent Labs    04/10/22 1310 04/10/22 1311 04/12/22 1140 04/12/22 1149 04/12/22 1915 04/13/22 0534  HGB 6.5*  --   --  6.3* 6.8* 8.1*  HCT 19.4*  --   --  19.5* 21.2* 24.3*  PLT 125*  --   --  141*  --  112*  LABPROT  --  33.3*  --  21.5*  --  20.1*  INR  --  3.3*  --  1.9*  --  1.7*  CREATININE 2.55*  --  2.62*  --   --  2.51*     Estimated Creatinine Clearance: 13.7 mL/min (A) (by C-G formula based on SCr of 2.51 mg/dL (H)).  Medications:  Medications Prior to Admission  Medication Sig Dispense Refill Last Dose   acetaminophen (TYLENOL) 325 MG tablet Take 650 mg by mouth every 6 (six) hours as needed for mild pain.    04/09/2022   alendronate (FOSAMAX) 70 MG tablet Take 70 mg by mouth every Saturday.   03/22/2022   allopurinol (ZYLOPRIM) 100 MG  tablet Take 100 mg by mouth daily.   04/12/2022   ALPRAZolam (XANAX) 0.5 MG tablet Take 0.25-0.5 mg by mouth at bedtime.   04/11/2022   amLODipine (NORVASC) 10 MG tablet TAKE 1 TABLET EVERY DAY (Patient taking differently: Take 10 mg by mouth daily.) 90 tablet 3 04/12/2022   atorvastatin (LIPITOR) 80 MG tablet Take 1 tablet (80 mg total) by mouth daily at 6 PM.   04/11/2022   Cholecalciferol (VITAMIN D3) 50 MCG (2000 UT) capsule Take 2,000 Units by mouth daily with lunch.   04/11/2022   cloNIDine (CATAPRES) 0.2 MG tablet TAKE 1 TABLET TWICE DAILY (Patient taking differently: Take 0.2 mg by mouth 2 (two) times daily.) 180 tablet 3 04/12/2022   Cyanocobalamin (VITAMIN B12) 1000 MCG TBCR Take 1,000 mcg by mouth daily with lunch.   04/11/2022   estradiol (ESTRACE) 0.1 MG/GM vaginal cream Place 0.5g nightly for two weeks then twice a week after (Patient taking differently: Place 1 Applicatorful vaginally 3 (three) times a week. Place 0.5g nightly for two weeks then twice a week after) 30 g 11 03/29/2022   famotidine (PEPCID) 20 MG tablet Take 1 tablet (20 mg total) by mouth 2 (two) times daily. 60 tablet 3 04/12/2022  furosemide (LASIX) 40 MG tablet TAKE 2 TABLETS (80 MG TOTAL) IN MORNING AND 1 AND 1/2 TABLETS (60 MG TOTAL) IN EVENING. CHANGE IN DOSAGE. (Patient taking differently: Take 40-80 mg by mouth See admin instructions. Takes 80 mg in the morning and 40 mg at night.) 315 tablet 0 04/12/2022   glipiZIDE (GLUCOTROL) 5 MG tablet Take 1 tablet (5 mg total) by mouth daily. 30 tablet 0 unk last dose   hydrALAZINE (APRESOLINE) 100 MG tablet TAKE 1 TABLET THREE TIMES DAILY (Patient taking differently: Take 100 mg by mouth 3 (three) times daily.) 270 tablet 3 04/12/2022   insulin aspart (NOVOLOG) 100 UNIT/ML injection Take 2 units for blood sugar 200-250, 4 units for blood sugar 252-300, 6 units for blood sugar 301-350, 8 units for blood sugar greater than 350 and call MD. (Patient taking differently: Inject 2-8 Units into the  skin 3 (three) times daily with meals. Take 2 units for blood sugar 200-250, 4 units for blood sugar 252-300, 6 units for blood sugar 301-350, 8 units for blood sugar greater than 350 and call MD.) 10 mL 11 04/12/2022   lactose free nutrition (BOOST) LIQD Take 237 mLs by mouth daily. (To supplement meals)    04/12/2022   lidocaine (XYLOCAINE) 2 % solution Use as directed 15 mLs in the mouth or throat as needed for mouth pain. (Patient taking differently: Use as directed 15 mLs in the mouth or throat daily as needed for mouth pain.) 100 mL 2 04/08/2022   magnesium oxide (MAG-OX) 400 (241.3 Mg) MG tablet Take 1 tablet (400 mg total) by mouth daily.   04/11/2022   metoprolol succinate (TOPROL-XL) 25 MG 24 hr tablet TAKE 1/2 TABLET EVERY DAY (Patient taking differently: Take 12.5 mg by mouth daily.) 45 tablet 3 04/12/2022 at 9 am   mycophenolate (CELLCEPT) 500 MG tablet Take 2 tablets (1,000 mg total) by mouth daily. (Patient taking differently: Take 500 mg by mouth 2 (two) times daily.) 60 tablet 1 04/12/2022   ondansetron (ZOFRAN-ODT) 4 MG disintegrating tablet Take 4 mg by mouth every 8 (eight) hours as needed for vomiting or nausea.   04/12/2022   pantoprazole (PROTONIX) 40 MG tablet Take 1 tablet (40 mg total) by mouth daily. 30 tablet 1 04/11/2022   predniSONE (DELTASONE) 10 MG tablet Take 20 mg by mouth daily with breakfast.   04/12/2022   spironolactone (ALDACTONE) 25 MG tablet TAKE 1 TABLET EVERY DAY (Patient taking differently: Take 25 mg by mouth daily.) 90 tablet 3 04/12/2022   tadalafil, PAH, (ADCIRCA) 20 MG tablet Take 2 tablets (40 mg total) by mouth daily. (Patient taking differently: Take 40 mg by mouth at bedtime.) 60 tablet 11 04/11/2022   TRESIBA FLEXTOUCH 100 UNIT/ML FlexTouch Pen Inject 14 Units into the skin at bedtime.   04/11/2022   vitamin C (ASCORBIC ACID) 500 MG tablet Take 500 mg by mouth daily with lunch.   04/11/2022   warfarin (COUMADIN) 5 MG tablet Take 1 to 1 &1/2 tablets by mouth daily as directed  by the coumadin clinic (Patient taking differently: Take 5-7.5 mg by mouth See admin instructions. 5 mg on Monday,Thursday,Saturday 7.5 mg on Wednesday, Friday and Sunday) 120 tablet 0 04/11/2022 at 5.30 pm   Blood Glucose Monitoring Suppl (TRUE METRIX METER) w/Device KIT       glucose blood (TRUE METRIX BLOOD GLUCOSE TEST) test strip Use 1-2 times daily      Insulin Syringe-Needle U-100 (B-D INS SYR ULTRAFINE .3CC/29G) 29G X 1/2" 0.3 ML  MISC Use for sliding scale for breakthrough high blood sugar readings 100 each 1    TRUE METRIX BLOOD GLUCOSE TEST test strip SMARTSIG:Via Meter      vitamin C (ASCORBIC ACID) 500 MG tablet       Scheduled:   ALPRAZolam  0.25 mg Oral QHS   amLODipine  10 mg Oral Daily   atorvastatin  80 mg Oral q1800   cloNIDine  0.2 mg Oral BID   famotidine  20 mg Oral BID   furosemide  40 mg Oral QHS   furosemide  80 mg Oral Daily   hydrALAZINE  100 mg Oral TID   insulin aspart  0-15 Units Subcutaneous TID WC   insulin aspart  0-5 Units Subcutaneous QHS   insulin glargine-yfgn  14 Units Subcutaneous QHS   magnesium oxide  400 mg Oral Q breakfast   metoprolol succinate  12.5 mg Oral Daily   mycophenolate  500 mg Oral BID   pantoprazole  40 mg Oral Daily   predniSONE  20 mg Oral Q breakfast   spironolactone  25 mg Oral Daily   tadalafil (PAH)  40 mg Oral QHS   Warfarin - Pharmacist Dosing Inpatient   Does not apply q1600   PRN: acetaminophen **OR** acetaminophen, fentaNYL (SUBLIMAZE) injection, oxyCODONE-acetaminophen  Assessment: 65 yoF with PMH PAF & mechanical AVR on warfarin, CAD s/p CABG 2005, PAH, DM, HTN, dCHF, vasculitis, autoimmune hemolytic anemia, admitted 7/5 for diverticulitis flare. Pharmacy consulted to continue warfarin while admitted. Received PRBC on 7/3 for Hgb 6.5.  Baseline INR slightly subtherapeutic Prior anticoagulation: warfarin 5 mg daily except 7.5 mg Wed, Fri, Sun; LD 7/4  Significant events:  Today, 04/13/2022: CBC: Hgb low d/t  hemolytic anemia but improved after PRBC x 2 overnight; Plt slightly low INR remains subtherapeutic and down slightly  Major drug interactions: none; broad spectrum abx (Zosyn) can increase warfarin sensitivity No bleeding issues per nursing CLD ordered  Goal of Therapy: INR 2-3 (confirmed with clinic records)  Plan: Warfarin 7.5 mg PO tonight x 1 Daily INR CBC daily while Hgb < 7 Monitor for signs of bleeding or thrombosis   Reuel Boom, PharmD, BCPS [phone] 04/13/2022, 12:26 PM

## 2022-04-13 NOTE — Assessment & Plan Note (Addendum)
INR subtherapeutic on admission, today up to 3.2 - Continue warfarin

## 2022-04-14 ENCOUNTER — Inpatient Hospital Stay: Payer: Medicare HMO | Admitting: Adult Health

## 2022-04-14 ENCOUNTER — Inpatient Hospital Stay: Payer: Medicare HMO

## 2022-04-14 ENCOUNTER — Inpatient Hospital Stay (HOSPITAL_COMMUNITY): Payer: Medicare HMO

## 2022-04-14 DIAGNOSIS — N3080 Other cystitis without hematuria: Secondary | ICD-10-CM | POA: Diagnosis not present

## 2022-04-14 DIAGNOSIS — N321 Vesicointestinal fistula: Secondary | ICD-10-CM

## 2022-04-14 DIAGNOSIS — K5792 Diverticulitis of intestine, part unspecified, without perforation or abscess without bleeding: Secondary | ICD-10-CM | POA: Diagnosis not present

## 2022-04-14 DIAGNOSIS — I5032 Chronic diastolic (congestive) heart failure: Secondary | ICD-10-CM | POA: Diagnosis not present

## 2022-04-14 DIAGNOSIS — D591 Autoimmune hemolytic anemia, unspecified: Secondary | ICD-10-CM | POA: Diagnosis not present

## 2022-04-14 HISTORY — DX: Vesicointestinal fistula: N32.1

## 2022-04-14 LAB — PREPARE RBC (CROSSMATCH)

## 2022-04-14 LAB — GLUCOSE, CAPILLARY
Glucose-Capillary: 97 mg/dL (ref 70–99)
Glucose-Capillary: 240 mg/dL — ABNORMAL HIGH (ref 70–99)
Glucose-Capillary: 441 mg/dL — ABNORMAL HIGH (ref 70–99)
Glucose-Capillary: 283 mg/dL — ABNORMAL HIGH (ref 70–99)

## 2022-04-14 LAB — BASIC METABOLIC PANEL
Anion gap: 10 (ref 5–15)
BUN: 128 mg/dL — ABNORMAL HIGH (ref 8–23)
CO2: 23 mmol/L (ref 22–32)
Calcium: 8.4 mg/dL — ABNORMAL LOW (ref 8.9–10.3)
Chloride: 108 mmol/L (ref 98–111)
Creatinine, Ser: 2.62 mg/dL — ABNORMAL HIGH (ref 0.44–1.00)
GFR, Estimated: 18 mL/min — ABNORMAL LOW (ref >60)
Glucose, Bld: 76 mg/dL (ref 70–99)
Potassium: 3.8 mmol/L (ref 3.5–5.1)
Sodium: 141 mmol/L (ref 135–145)

## 2022-04-14 LAB — PROTIME-INR
INR: 2.6 — ABNORMAL HIGH (ref 0.8–1.2)
Prothrombin Time: 27.9 seconds — ABNORMAL HIGH (ref 11.4–15.2)

## 2022-04-14 LAB — RETICULOCYTES
Immature Retic Fract: 23.2 % — ABNORMAL HIGH (ref 2.3–15.9)
RBC.: 2.32 MIL/uL — ABNORMAL LOW (ref 3.87–5.11)
Retic Count, Absolute: 128.8 K/uL (ref 19.0–186.0)
Retic Ct Pct: 5.6 % — ABNORMAL HIGH (ref 0.4–3.1)

## 2022-04-14 LAB — CBC
HCT: 21.3 % — ABNORMAL LOW (ref 36.0–46.0)
Hemoglobin: 7.1 g/dL — ABNORMAL LOW (ref 12.0–15.0)
MCH: 30.6 pg (ref 26.0–34.0)
MCHC: 33.3 g/dL (ref 30.0–36.0)
MCV: 91.8 fL (ref 80.0–100.0)
Platelets: 113 10*3/uL — ABNORMAL LOW (ref 150–400)
RBC: 2.32 MIL/uL — ABNORMAL LOW (ref 3.87–5.11)
RDW: 17.9 % — ABNORMAL HIGH (ref 11.5–15.5)
WBC: 4.5 10*3/uL (ref 4.0–10.5)
nRBC: 0 % (ref 0.0–0.2)

## 2022-04-14 MED ORDER — SODIUM CHLORIDE 0.9% IV SOLUTION: Freq: Once | INTRAVENOUS | Status: AC

## 2022-04-14 MED ORDER — FAMOTIDINE 20 MG PO TABS
20.0000 mg | ORAL_TABLET | Freq: Every day | ORAL | Status: DC
  Administered 2022-04-15 – 2022-04-16 (×2): 20 mg via ORAL
  Filled 2022-04-14 (×2): qty 1

## 2022-04-14 MED ORDER — INSULIN ASPART 100 UNIT/ML IJ SOLN
3.0000 [IU] | Freq: Three times a day (TID) | INTRAMUSCULAR | Status: DC
  Administered 2022-04-14 – 2022-04-16 (×7): 3 [IU] via SUBCUTANEOUS

## 2022-04-14 MED ORDER — WARFARIN SODIUM 5 MG PO TABS
5.0000 mg | ORAL_TABLET | Freq: Once | ORAL | Status: AC
Start: 2022-04-14 — End: 2022-04-14
  Administered 2022-04-14: 5 mg via ORAL
  Filled 2022-04-14: qty 1

## 2022-04-14 NOTE — Progress Notes (Signed)
Subjective: Denies abdominal pain. No nausea or emesis. Afebrile. Tolerating foley catheter.   Objective: Vital signs in last 24 hours: Temp:  [97.5 F (36.4 C)-98.3 F (36.8 C)] 98.3 F (36.8 C) (07/07 0539) Pulse Rate:  [67-85] 75 (07/07 0539) Resp:  [17-19] 19 (07/07 0539) BP: (112-149)/(35-51) 131/35 (07/07 0539) SpO2:  [98 %-99 %] 99 % (07/07 0539)  Intake/Output from previous day: 07/06 0701 - 07/07 0700 In: 480 [P.O.:480] Out: 3050 [Urine:3050] Intake/Output this shift: No intake/output data recorded. UOP: 3L clear yellow urine  Physical Exam:  General: Alert and oriented CV: RRR Lungs: Clear Abdomen: Soft, ND, ATTP Ext: NT, No erythema  Lab Results: Recent Labs    04/12/22 1915 04/13/22 0534 04/14/22 0425  HGB 6.8* 8.1* 7.1*  HCT 21.2* 24.3* 21.3*   BMET Recent Labs    04/13/22 0534 04/14/22 0425  NA 137 141  K 4.2 3.8  CL 105 108  CO2 21* 23  GLUCOSE 127* 76  BUN 142* 128*  CREATININE 2.51* 2.62*  CALCIUM 8.5* 8.4*     Studies/Results: CT ABDOMEN PELVIS WO CONTRAST  Result Date: 04/12/2022 CLINICAL DATA:  A female at age 19 presents with acute abdominal pain. EXAM: CT ABDOMEN AND PELVIS WITHOUT CONTRAST TECHNIQUE: Multidetector CT imaging of the abdomen and pelvis was performed following the standard protocol without IV contrast. RADIATION DOSE REDUCTION: This exam was performed according to the departmental dose-optimization program which includes automated exposure control, adjustment of the mA and/or kV according to patient size and/or use of iterative reconstruction technique. COMPARISON:  Feb 20, 2022. FINDINGS: Lower chest: Calcified coronary artery disease. Signs of prior granulomatous disease. Hepatobiliary: Liver with smooth contours. Post cholecystectomy. No gross biliary duct distension. Pancreas: No signs of pancreatic inflammation.  Pancreatic atrophy. Spleen: Granulomatous changes in the spleen. Adrenals/Urinary Tract: Renal vascular  calcifications. No perinephric stranding, no hydronephrosis or obstructing renal calculus. Normal adrenal glands. Gas throughout the urinary bladder at the periphery. Gas within the urinary bladder and about the urinary bladder is slightly increased from previous imaging. Density of material in the urinary bladder otherwise with simple fluid density. Towards the base of the urinary bladder is difficult to exclude the possibility of emphysematous cystitis based on the relationship of small gas locules of the wall of the urinary bladder. There is no substantial perivesical stranding. Stomach/Bowel: Small hiatal hernia. Small duodenal diverticulum. No acute small bowel process. The appendix is not visible though there are no secondary signs to suggest acute appendicitis. Colon is largely collapsed. There are signs of acute diverticulitis in the LEFT lower quadrant. Posterior diverticulum on image 48/2 with some mild surrounding stranding. Diverticulosis and diverticular disease throughout the sigmoid is moderate to marked and otherwise unchanged. Vascular/Lymphatic: Calcified aortic atherosclerosis. No aneurysmal dilation of the abdominal aorta. Signs of prior stenting of the LEFT common iliac artery. No adenopathy in either the abdomen or in the pelvis. Reproductive: Post hysterectomy without adnexal mass. Other: No free air or ascites. Musculoskeletal: Osteopenia. No acute bone finding or destructive bone process. Spinal degenerative changes. Redemonstration of compression fracture at L4 with similar appearance. IMPRESSION: 1. Emphysematous cystitis but without substantial surrounding stranding or bladder wall thickening. Consider correlation with urinalysis and urologic and or ID consultation for further management as this can be life-threatening. Close follow-up is suggested. 2. Acute diverticulitis of the sigmoid colon. 3. Gas in the urinary bladder is increased since previous imaging. 4. Aortic atherosclerosis  and coronary artery disease. 5. Similar appearance of L4 compression fracture. Aortic  Atherosclerosis (ICD10-I70.0) and Emphysema (ICD10-J43.9). These results were called by telephone at the time of interpretation on 04/12/2022 at 1:14 pm to provider Davonna Belling , who verbally acknowledged these results. Electronically Signed   By: Zetta Bills M.D.   On: 04/12/2022 13:19    Assessment/Plan: #1.  Emphysematous cystitis: CT A/P 04/12/2022 with emphysematous cystitis and increased air compared to prior study in 02/2022.  Urinalysis with negative nitrates, small leukocyte esterase.  Urine culture pending.  On Zosyn. 2.  Possible colovesicular fistula: CT A/P 02/2022 with air present within the bladder with extensive diverticulosis suggestive of colovesicular fistula. 3.  Possible colovaginal fistula: CT A/P 02/2022 with air present within the vaginal vault.  4. Diverticulitis  -She looks well, denies pain, urine draining clear yellow. -Continue broad spectrum antibiotics. F/u urine culture. -Leave foley catheter in place for at least one week. Will plan on void trial and cystoscopy outpatient. This has been arranged. -Recommend surgery evaluation given diverticulitis with likely colovesicular fistula and possible colovaginal fistula. Evidence on CT in 02/2022. Unlikely to need acute surgery however may benefit from elective resection.  -Notified Dr. Matilde Sprang of her admission. Will arrange for outpatient cystoscopy. -Following peripherally. Please call with questions.    LOS: 2 days   Matt R. Adisynn Suleiman MD 04/14/2022, 7:16 AM [facility]  Pager: [phone]

## 2022-04-14 NOTE — Progress Notes (Signed)
Progress Note   Patient: [name] QTM:[id number] DOB: 1941-09-21 DOA: 04/12/2022     2 DOS: the patient was seen and examined on 04/14/2022 at 10:00AM      Brief hospital course: Mrs. Romano is an 81 y.o. 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  In the ER, CT showed emphysematous cystitis, possible diverticulitis.  Foley placed, started on antibiotics and admitted.     Assessment and Plan: * Emphysematous cystitis Reviewed with radiology, low degree of prolapse on imaging, does clearly appear to be emphysematous cystitis.  Pelvic bladder prolapse and mycophenolate both predispose to infection.  Discussed with Urology, plan for foley and antibiotics.  Sepsis ruled out.  Diverticulitis ruled out.  Improving. - Continue Zosyn - Urine culture - Maintain foley 7 days - Consult urology, appreciate expertise     Autoimmune hemolytic anemia ([place]) Follows with hematology.  Hemoglobin 6.3 on admission, now status posttransfusion.  Hemoglobin improved.  Hgb back down to 7.1 today. - Repeat PRBC transfusion - Continue mycophenolate, prednisone - Consult oncology, appreciate recommendations  Suspected colovesical fistula CT imaging from May suggested the presence of a colovesical fistula.  Given her recurrent bladder infection with air in the wall of the bladder, urology and I suspect this is in fact the case.  Discussed with general surgery today, may just require an outpatient work-up.  She is a high risk surgical candidate. - Consult general surgery, appreciate recommendations  Female bladder prolapse - Continue tadalafil  Subtherapeutic international normalized ratio (INR) INR corrected - Continue warfarin  CKD (chronic kidney disease), stage IV (HCC) Creatinine  stable relative to baseline 2.4-2.6  Vasculitis, ANCA positive - Continue mycophenolate, prednisone  PAF (paroxysmal atrial fibrillation) (HCC) In the presence of Saint Jude mechanical valve.  Rate controlled -Continue warfarin - Continue metoprolol, clonidine  Moderate to severe pulmonary hypertension (HCC) - Continue home spironolactone and furosemide  Chronic diastolic CHF (congestive heart failure) (HCC) Appears relatively euvolemic - Continue furosemide spironolactone  GERD (gastroesophageal reflux disease) - Continue famotidine and pantoprazole  Thrombocytopenia (HCC) Platelets stable  Diabetes mellitus without complication (HCC) With hyperglycemia -Continue glargine - Continue sliding scale corrections, increase dose   Hypercholesteremia - Continue atorvastatin  Hypertension Blood pressure labile but mostly elevated - Continue amlodipine, clonidine, furosemide, hydralazine, metoprolol, spironolactone          Subjective: No abdominal pain, no fever, no confusion.  She is hungry.  She has had no bowel movements in the last 24 hours     Physical Exam: Vitals:   04/13/22 1103 04/13/22 1517 04/13/22 1958 04/14/22 0539  BP: (!) 112/51 (!) 128/45 (!) 149/46 (!) 131/35  Pulse: 67 72 85 75  Resp: '18 18 17 19  '$ Temp: (!) 97.5 F (36.4 C) 98 F (36.7 C) 98.1 F (36.7 C) 98.3 F (36.8 C)  TempSrc: Oral Oral Oral Oral  SpO2: 98% 99% 98% 99%  Weight:      Height:       Elderly adult female, appears pale and tired, lying in bed, interactive and appropriate, oriented RRR, I do not appreciate murmurs, she has mild puffiness of the ankles bilaterally, no significant edema of the legs no Respiratory rate normal, lungs clear without rales or wheezes Abdomen soft without tenderness in all 4 quadrants, no guarding or rigidity Attention normal, affect  normal, judgment insight appear normal     Data Reviewed: Basic metabolic panel shows creatinine 2.6, no  change, sodium potassium normal Hemoglobin on CBC is down to 7.1, platelets stable INR 2.6  Family Communication: son by phone    Disposition: Status is: Inpatient         Author: Edwin Dada, MD 04/14/2022 10:18 AM  For on call review [email or web address].

## 2022-04-14 NOTE — Progress Notes (Signed)
PT Cancellation Note  Patient Details Name: [name] MRN: [id number] DOB: 01-Nov-1940   Cancelled Treatment:    Reason Eval/Treat Not Completed: Other (comment) (pt requested PT return after she's eaten. Will follow.)   Philomena Doheny PT 04/14/2022  Acute Rehabilitation Services  Office [phone]

## 2022-04-14 NOTE — Progress Notes (Signed)
PT Cancellation Note  Patient Details Name: [name] MRN: [id number] DOB: 05/30/1941   Cancelled Treatment:    Reason Eval/Treat Not Completed: Fatigue/lethargy limiting ability to participate (pt reports she's feeling too tired to mobilize, she requested PT attempt at another time. Noted Hgb 7.1. Will follow.)   Philomena Doheny PT 04/14/2022  Acute Rehabilitation Services  Office [phone]

## 2022-04-14 NOTE — Consult Note (Cosign Needed Addendum)
[name] August 22, 1941  [id number].    Requesting MD: Loleta Books, MD Chief Complaint/Reason for Consult: possible CV fistula, emphysematous cystitis.   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 She reports several weeks of lower abdominal discomfort, acutely worsened 3-4 days prior to admission and was much more severe and cramping in nature.  Patient unsure if she was having pneumaturia, denies pyuria. She denies a known history of diverticulitis, known history of diverticulosis. She denies changes in her bowel habits - states for the last year, since she was taken off of metformin, she has loose, nonbloody stools followed by periods of constipation for which she eats foods high in fiber and takes miralax. She denies tobacco use. Her hematologist is Dr. Chryl Heck.  She is followed by Dr. [name] and Dr. Aundra Dubin for her A-fib and heart failure.   Abdominal surgical history: abdominal hysterectomy 1989, laparoscopic cholecystectomy 2019   Review of Systems  Constitutional:  Positive for malaise/fatigue.  HENT: Negative.    Eyes: Negative.   Respiratory: Negative.    Cardiovascular:  Positive for leg swelling.  Gastrointestinal:  Positive for abdominal pain, constipation and diarrhea. Negative for blood in stool.  Skin: Negative.   Neurological: Negative.   Psychiatric/Behavioral: Negative.      Family History  Problem Relation Age of Onset   Heart disease Father    Hypertension Father    Hyperlipidemia Father    Breast cancer Neg Hx     Past Medical History:  Diagnosis Date    Aortic atherosclerosis ([place]) 01/23/2018   Atrial fibrillation ([facility])    Benign positional vertigo 10/06/2011   Carotid artery disease ([place])    Chemotherapy induced neutropenia ([place]) 07/21/2019   Cholelithiasis 01/23/2018   Cholelithiasis 01/23/2018   Chronic anticoagulation    Coronary artery disease    status post coronary artery bypass grafting times 07/10/2004   Diabetes mellitus    Hypercholesteremia    Hypertension    Mechanical heart valve present    H. aortic valve replacement at the time of bypass surgery October 2005   Moderate to severe pulmonary hypertension ([place])    Peripheral arterial disease ([place])    history of left common iliac artery PTA and stenting for a chronic total occlusion 08/26/01    Past Surgical History:  Procedure Laterality Date   anterior repair  2009   AORTIC VALVE REPLACEMENT  2005   Dr. Cyndia Bent   CARDIAC CATHETERIZATION  11/10/2004   40% right common illiac, 70% in stent restenosis of distal left common illiac,    CARDIAC CATHETERIZATION  05/18/2004   LAD 50-70% midstenosis, RCA dominant w/50% stenosis, 50% Right common Illiac artery ostial stenosis, 90% in stent restenosis within midportion of left common illiac stent   Carotid Duplex  03/12/2012   RSA-elev. velocities suggestive of a 50-69% diameter reduction, Right&Left Bulb/Prox ICA-mild-mod.fibrous plaqueelevating Velocities abnormal study.   CHOLECYSTECTOMY N/A 03/01/2018   Procedure: LAPAROSCOPIC CHOLECYSTECTOMY;  Surgeon: Judeth Horn, MD;  Location: [place];  Service: General;  Laterality: N/A;   COLONOSCOPY WITH PROPOFOL N/A 01/22/2018   Procedure: COLONOSCOPY WITH PROPOFOL;  Surgeon: Wilford Corner, MD;  Location: [facility];  Service: Endoscopy;  Laterality: N/A;   ESOPHAGOGASTRODUODENOSCOPY (EGD) WITH PROPOFOL N/A 01/22/2018   Procedure: ESOPHAGOGASTRODUODENOSCOPY (EGD) WITH PROPOFOL;  Surgeon: Wilford Corner, MD;  Location: [facility];  Service: Endoscopy;  Laterality: N/A;    ESOPHAGOGASTRODUODENOSCOPY (EGD) WITH PROPOFOL N/A 11/21/2018   Procedure: ESOPHAGOGASTRODUODENOSCOPY (EGD) WITH PROPOFOL;  Surgeon: [name], MD;  Location: [facility];  Service: Gastroenterology;  Laterality: N/A;   ESOPHAGOGASTRODUODENOSCOPY (EGD) WITH PROPOFOL Left 02/27/2019   Procedure: ESOPHAGOGASTRODUODENOSCOPY (EGD) WITH PROPOFOL;  Surgeon: Arta Silence, MD;  Location: [facility];  Service: Endoscopy;  Laterality: Left;   GIVENS CAPSULE STUDY N/A 11/21/2018   Procedure: GIVENS CAPSULE STUDY;  Surgeon: [name], MD;  Location: [facility];  Service: Gastroenterology;  Laterality: N/A;  To be deployed during EGD   Lower Ext. Duplex  03/12/2012   Right Proximal CIA- vessel narrowing w/elevated velocities 0-49% diameter reduction. Right SFA-mild mixed density plaque throughout vessel.   NM MYOCAR PERF WALL MOTION  05/19/2010   protocol: Persantine, post stress EF 65%, negative for ischemia, low risk scan   RIGHT HEART CATH N/A 06/27/2018   Procedure: RIGHT HEART CATH;  Surgeon: [name], MD;  Location: [facility];  Service: Cardiovascular;  Laterality: N/A;   TOTAL ABDOMINAL HYSTERECTOMY W/ BILATERAL SALPINGOOPHORECTOMY  1989   TRANSTHORACIC ECHOCARDIOGRAM  08/29/2012   Moderately calcified annulus of mitral valve, moderate regurg. of both mitral valve and tricuspid valve.     Social History:  reports that she has quit smoking. Her smoking use included cigarettes. She has a 30.00 pack-year smoking history. She has never used smokeless tobacco. She reports that she does not drink alcohol and does not use drugs.  Allergies:  Allergies  Allergen Reactions   Flagyl [Metronidazole] Rash    ALL-OVER BODY RASH   Ciprofloxacin     Other reaction(s): Stomach upset - tolerates Other reaction(s): Stomach upset - tolerates   Coreg [Carvedilol] Other (See Comments)    Terrible cramping in the feet and had a lot of bowel movements, but not diarrhea   Diflucan  [Fluconazole] Diarrhea   Losartan Swelling    Patient doesn't recall site of swelling   Privigen [Immune Globulin (Human)] Other (See Comments)    Severe/excruciating pain   Sulfamethoxazole-Trimethoprim     Other reaction(s): stomach upset .   Verapamil Hives   Zetia [Ezetimibe] Other (See Comments)    Reaction not recalled   Zocor [Simvastatin - High Dose] Other (See Comments)    Reaction not recalled   Gatifloxacin Rash    Redness to skin around eye    Medications Prior to Admission  Medication Sig Dispense Refill   acetaminophen (TYLENOL) 325 MG tablet Take 650 mg by mouth every 6 (six) hours as needed for mild pain.      alendronate (FOSAMAX) 70 MG tablet Take 70 mg by mouth every Saturday.     allopurinol (ZYLOPRIM) 100 MG tablet Take 100 mg by mouth daily.     ALPRAZolam (XANAX) 0.5 MG tablet Take 0.25-0.5 mg by mouth at bedtime.     amLODipine (NORVASC) 10 MG tablet TAKE 1 TABLET EVERY DAY (Patient taking differently: Take 10 mg by mouth daily.) 90 tablet 3   atorvastatin (LIPITOR) 80 MG tablet Take 1 tablet (80 mg total) by mouth daily at 6 PM.     Cholecalciferol (VITAMIN D3) 50 MCG (2000 UT) capsule Take 2,000 Units by mouth daily with lunch.     cloNIDine (CATAPRES) 0.2 MG tablet TAKE 1 TABLET TWICE DAILY (Patient taking differently: Take  0.2 mg by mouth 2 (two) times daily.) 180 tablet 3   Cyanocobalamin (VITAMIN B12) 1000 MCG TBCR Take 1,000 mcg by mouth daily with lunch.     estradiol (ESTRACE) 0.1 MG/GM vaginal cream Place 0.5g nightly for two weeks then twice a week after (Patient taking differently: Place 1 Applicatorful vaginally 3 (three) times a week. Place 0.5g nightly for two weeks then twice a week after) 30 g 11   famotidine (PEPCID) 20 MG tablet Take 1 tablet (20 mg total) by mouth 2 (two) times daily. 60 tablet 3   furosemide (LASIX) 40 MG tablet TAKE 2 TABLETS (80 MG TOTAL) IN MORNING AND 1 AND 1/2 TABLETS (60 MG TOTAL) IN EVENING. CHANGE IN DOSAGE. (Patient  taking differently: Take 40-80 mg by mouth See admin instructions. Takes 80 mg in the morning and 40 mg at night.) 315 tablet 0   glipiZIDE (GLUCOTROL) 5 MG tablet Take 1 tablet (5 mg total) by mouth daily. 30 tablet 0   hydrALAZINE (APRESOLINE) 100 MG tablet TAKE 1 TABLET THREE TIMES DAILY (Patient taking differently: Take 100 mg by mouth 3 (three) times daily.) 270 tablet 3   insulin aspart (NOVOLOG) 100 UNIT/ML injection Take 2 units for blood sugar 200-250, 4 units for blood sugar 252-300, 6 units for blood sugar 301-350, 8 units for blood sugar greater than 350 and call MD. (Patient taking differently: Inject 2-8 Units into the skin 3 (three) times daily with meals. Take 2 units for blood sugar 200-250, 4 units for blood sugar 252-300, 6 units for blood sugar 301-350, 8 units for blood sugar greater than 350 and call MD.) 10 mL 11   lactose free nutrition (BOOST) LIQD Take 237 mLs by mouth daily. (To supplement meals)      lidocaine (XYLOCAINE) 2 % solution Use as directed 15 mLs in the mouth or throat as needed for mouth pain. (Patient taking differently: Use as directed 15 mLs in the mouth or throat daily as needed for mouth pain.) 100 mL 2   magnesium oxide (MAG-OX) 400 (241.3 Mg) MG tablet Take 1 tablet (400 mg total) by mouth daily.     metoprolol succinate (TOPROL-XL) 25 MG 24 hr tablet TAKE 1/2 TABLET EVERY DAY (Patient taking differently: Take 12.5 mg by mouth daily.) 45 tablet 3   mycophenolate (CELLCEPT) 500 MG tablet Take 2 tablets (1,000 mg total) by mouth daily. (Patient taking differently: Take 500 mg by mouth 2 (two) times daily.) 60 tablet 1   ondansetron (ZOFRAN-ODT) 4 MG disintegrating tablet Take 4 mg by mouth every 8 (eight) hours as needed for vomiting or nausea.     pantoprazole (PROTONIX) 40 MG tablet Take 1 tablet (40 mg total) by mouth daily. 30 tablet 1   predniSONE (DELTASONE) 10 MG tablet Take 20 mg by mouth daily with breakfast.     spironolactone (ALDACTONE) 25 MG  tablet TAKE 1 TABLET EVERY DAY (Patient taking differently: Take 25 mg by mouth daily.) 90 tablet 3   tadalafil, PAH, (ADCIRCA) 20 MG tablet Take 2 tablets (40 mg total) by mouth daily. (Patient taking differently: Take 40 mg by mouth at bedtime.) 60 tablet 11   TRESIBA FLEXTOUCH 100 UNIT/ML FlexTouch Pen Inject 14 Units into the skin at bedtime.     vitamin C (ASCORBIC ACID) 500 MG tablet Take 500 mg by mouth daily with lunch.     warfarin (COUMADIN) 5 MG tablet Take 1 to 1 &1/2 tablets by mouth daily as directed by the coumadin clinic (  Patient taking differently: Take 5-7.5 mg by mouth See admin instructions. 5 mg on Monday,Thursday,Saturday 7.5 mg on Wednesday, Friday and Sunday) 120 tablet 0   Blood Glucose Monitoring Suppl (TRUE METRIX METER) w/Device KIT      glucose blood (TRUE METRIX BLOOD GLUCOSE TEST) test strip Use 1-2 times daily     Insulin Syringe-Needle U-100 (B-D INS SYR ULTRAFINE .3CC/29G) 29G X 1/2" 0.3 ML MISC Use for sliding scale for breakthrough high blood sugar readings 100 each 1   TRUE METRIX BLOOD GLUCOSE TEST test strip SMARTSIG:Via Meter     vitamin C (ASCORBIC ACID) 500 MG tablet        Physical Exam: Blood pressure (!) 130/51, pulse (!) 58, temperature 97.8 F (36.6 C), temperature source Oral, resp. rate 20, height _0  (1.499 m), weight 63.3 kg, SpO2 98 %. General: Pleasant white female laying on hospital bed, appears stated age, NAD, receiving pRBC infusion HEENT: head -normocephalic, atraumatic; Eyes: PERRLA, no conjunctival injection, anicteric sclerae  Neck- Trachea is midline, no thyromegaly  CV- RRR, mild lower extremity edema bilaterally without pitting  Pulm- breathing is non-labored. CTABL, no wheezes, rhales, rhonchi. Abd- soft, protuberant, focally tender over the SP region without guarding or peritonitis, no hernias or masses.  GU- foley in place draining clear yellow urine MSK- UE/LE symmetrical, Neuro- non-focal exam, gait not assessed Psych-  Alert and Oriented x3 with appropriate affect Skin: warm and dry, no rashes or lesions   Results for orders placed or performed during the hospital encounter of 04/12/22 (from the past 48 hour(s))  CBG monitoring, ED     Status: Abnormal   Collection Time: 04/12/22  5:09 PM  Result Value Ref Range   Glucose-Capillary 339 (H) 70 - 99 mg/dL    Comment: Glucose reference range applies only to samples taken after fasting for at least 8 hours.  Brain natriuretic peptide     Status: None   Collection Time: 04/12/22  5:16 PM  Result Value Ref Range   B Natriuretic Peptide 61.5 0.0 - 100.0 pg/mL    Comment: Performed at [facility], [place] [address] [place], [place] [address]  Lactic acid, plasma     Status: None   Collection Time: 04/12/22  5:16 PM  Result Value Ref Range   Lactic Acid, Venous 1.1 0.5 - 1.9 mmol/L    Comment: Performed at [facility], [place] [address] [place], [place] [address]  Hemoglobin and hematocrit, blood     Status: Abnormal   Collection Time: 04/12/22  7:15 PM  Result Value Ref Range   Hemoglobin 6.8 (LL) 12.0 - 15.0 g/dL    Comment: REPEATED TO VERIFY THIS CRITICAL RESULT HAS VERIFIED AND BEEN CALLED TO A,COLMAN BY [facility] ON 07 05 2023 AT 2157, AND HAS BEEN READ BACK.     HCT 21.2 (L) 36.0 - 46.0 %    Comment: Performed at [facility], [place] [address], [place], [place] [address]  Lactic acid, plasma     Status: None   Collection Time: 04/12/22  7:40 PM  Result Value Ref Range   Lactic Acid, Venous 1.5 0.5 - 1.9 mmol/L    Comment: Performed at [facility], [place] [address], [place], [place] [address]  CBG monitoring, ED     Status: Abnormal   Collection Time: 04/12/22  9:18 PM  Result Value Ref Range   Glucose-Capillary 214 (H) 70 - 99 mg/dL    Comment: Glucose reference  range applies only to samples taken after fasting for at least 8 hours.  Prepare RBC (crossmatch)     Status:  None   Collection Time: 04/12/22 10:30 PM  Result Value Ref Range   Order Confirmation      ORDER PROCESSED BY BLOOD BANK Performed at [facility], [place] [address], [place], [place] [address]   CBC     Status: Abnormal   Collection Time: 04/13/22  5:34 AM  Result Value Ref Range   WBC 6.4 4.0 - 10.5 K/uL   RBC 2.68 (L) 3.87 - 5.11 MIL/uL   Hemoglobin 8.1 (L) 12.0 - 15.0 g/dL   HCT 24.3 (L) 36.0 - 46.0 %   MCV 90.7 80.0 - 100.0 fL   MCH 30.2 26.0 - 34.0 pg   MCHC 33.3 30.0 - 36.0 g/dL   RDW 17.9 (H) 11.5 - 15.5 %   Platelets 112 (L) 150 - 400 K/uL   nRBC 0.0 0.0 - 0.2 %    Comment: Performed at [facility], [place] [address], [place], [place] [address]  Protime-INR     Status: Abnormal   Collection Time: 04/13/22  5:34 AM  Result Value Ref Range   Prothrombin Time 20.1 (H) 11.4 - 15.2 seconds   INR 1.7 (H) 0.8 - 1.2    Comment: (NOTE) INR goal varies based on device and disease states. Performed at [facility], [place] [address], [place], [place] [address]   Comprehensive metabolic panel     Status: Abnormal   Collection Time: 04/13/22  5:34 AM  Result Value Ref Range   Sodium 137 135 - 145 mmol/L   Potassium 4.2 3.5 - 5.1 mmol/L   Chloride 105 98 - 111 mmol/L   CO2 21 (L) 22 - 32 mmol/L   Glucose, Bld 127 (H) 70 - 99 mg/dL    Comment: Glucose reference range applies only to samples taken after fasting for at least 8 hours.   BUN 142 (H) 8 - 23 mg/dL    Comment: RESULTS CONFIRMED BY MANUAL DILUTION   Creatinine, Ser 2.51 (H) 0.44 - 1.00 mg/dL   Calcium 8.5 (L) 8.9 - 10.3 mg/dL   Total Protein 4.9 (L) 6.5 - 8.1 g/dL   Albumin 2.7 (L) 3.5 - 5.0 g/dL   AST 21 15 - 41 U/L   ALT 18 0 - 44 U/L   Alkaline Phosphatase 36 (L) 38 - 126 U/L   Total Bilirubin 1.1 0.3 - 1.2 mg/dL   GFR, Estimated 19 (L) >60 mL/min    Comment: (NOTE) Calculated using the CKD-EPI Creatinine Equation (2021)    Anion gap 11 5 - 15    Comment:  Performed at [facility], [place] [address] [place], [place] [address]  Glucose, capillary     Status: Abnormal   Collection Time: 04/13/22  7:43 AM  Result Value Ref Range   Glucose-Capillary 227 (H) 70 - 99 mg/dL    Comment: Glucose reference range applies only to samples taken after fasting for at least 8 hours.  Glucose, capillary     Status: Abnormal   Collection Time: 04/13/22 11:29 AM  Result Value Ref Range   Glucose-Capillary 215 (H) 70 - 99 mg/dL    Comment: Glucose reference range applies only to samples taken after fasting for at least 8 hours.  Glucose, capillary     Status: Abnormal   Collection Time: 04/13/22  6:24 PM  Result Value Ref Range  Glucose-Capillary 301 (H) 70 - 99 mg/dL    Comment: Glucose reference range applies only to samples taken after fasting for at least 8 hours.  Glucose, capillary     Status: Abnormal   Collection Time: 04/13/22  9:13 PM  Result Value Ref Range   Glucose-Capillary 247 (H) 70 - 99 mg/dL    Comment: Glucose reference range applies only to samples taken after fasting for at least 8 hours.  Protime-INR     Status: Abnormal   Collection Time: 04/14/22  4:25 AM  Result Value Ref Range   Prothrombin Time 27.9 (H) 11.4 - 15.2 seconds   INR 2.6 (H) 0.8 - 1.2    Comment: (NOTE) INR goal varies based on device and disease states. Performed at [facility], [place] [address] [place], [place] [address]   CBC     Status: Abnormal   Collection Time: 04/14/22  4:25 AM  Result Value Ref Range   WBC 4.5 4.0 - 10.5 K/uL   RBC 2.32 (L) 3.87 - 5.11 MIL/uL   Hemoglobin 7.1 (L) 12.0 - 15.0 g/dL   HCT 21.3 (L) 36.0 - 46.0 %   MCV 91.8 80.0 - 100.0 fL   MCH 30.6 26.0 - 34.0 pg   MCHC 33.3 30.0 - 36.0 g/dL   RDW 17.9 (H) 11.5 - 15.5 %   Platelets 113 (L) 150 - 400 K/uL   nRBC 0.0 0.0 - 0.2 %    Comment: Performed at [facility], [place] [address] [place], [place] [address]  Basic metabolic  panel     Status: Abnormal   Collection Time: 04/14/22  4:25 AM  Result Value Ref Range   Sodium 141 135 - 145 mmol/L   Potassium 3.8 3.5 - 5.1 mmol/L   Chloride 108 98 - 111 mmol/L   CO2 23 22 - 32 mmol/L   Glucose, Bld 76 70 - 99 mg/dL    Comment: Glucose reference range applies only to samples taken after fasting for at least 8 hours.   BUN 128 (H) 8 - 23 mg/dL    Comment: RESULTS CONFIRMED BY MANUAL DILUTION   Creatinine, Ser 2.62 (H) 0.44 - 1.00 mg/dL   Calcium 8.4 (L) 8.9 - 10.3 mg/dL   GFR, Estimated 18 (L) >60 mL/min    Comment: (NOTE) Calculated using the CKD-EPI Creatinine Equation (2021)    Anion gap 10 5 - 15    Comment: Performed at [facility], [place] [address], [place], [place] [address]  Reticulocytes     Status: Abnormal   Collection Time: 04/14/22  4:25 AM  Result Value Ref Range   Retic Ct Pct 5.6 (H) 0.4 - 3.1 %   RBC. 2.32 (L) 3.87 - 5.11 MIL/uL   Retic Count, Absolute 128.8 19.0 - 186.0 K/uL   Immature Retic Fract 23.2 (H) 2.3 - 15.9 %    Comment: Performed at [facility], [place] [address] [place], [place] [address]  Glucose, capillary     Status: None   Collection Time: 04/14/22  7:48 AM  Result Value Ref Range   Glucose-Capillary 97 70 - 99 mg/dL    Comment: Glucose reference range applies only to samples taken after fasting for at least 8 hours.  Prepare RBC (crossmatch)     Status: None   Collection Time: 04/14/22 10:14 AM  Result Value Ref Range   Order Confirmation      ORDER PROCESSED BY BLOOD BANK Performed at [facility]  [place] [address], [place], [place] [address]   Glucose, capillary     Status: Abnormal   Collection Time: 04/14/22 11:47 AM  Result Value Ref Range   Glucose-Capillary 240 (H) 70 - 99 mg/dL    Comment: Glucose reference range applies only to samples taken after fasting for at least 8 hours.   *Note: Due to a large number of results and/or encounters for the requested  time period, some results have not been displayed. A complete set of results can be found in Results Review.   No results found.  Assessment/Plan Emphysematous cystitis Diverticulosis Possible colovesical, colovaginal fistulae  Pleasant 81 y/o F with multiple medical problems listed below admitted with emphysematous cystitis who currently appears non-toxic and to be improving with foley and IV abx. Surgery was consulted due to the presence of diverticulosis and suspicion for colovesical and possible colovaginal fistulae based on recurrent UTI's and increased air in the bladder on CT scans from 02/2022 and 04/2022. Urine Cx growing enterococcus faecalis. Her last colonoscopy was in 2019 and at the time she had diverticulosis without evidence of fistula. I have ordered a CT with rectal contrast in attempt to visualize/confirm the presence of a fistula. I have asked cardiology to perform a perioperative risk assessment of the patient and they have agreed to see her on 7/8. Given her age, comorbidities, and current use of immunosuppressants/steroids I do not think she would be a candidate for a definitive CV fistula takedown/repair. In attempt to control recurrent cystitis/infection we could consider diverting loop colostomy. Further surgical planning pending medical and cardiac clearance for surgery. Will discuss further with my attending and with colorectal surgery. If surgery is planned she will need to be transition from coumadin to a heparin gtt temporarily.   FEN - Reg VTE - SCD's, Coumadin ID - Zosyn   Autoimmune hemolytic anemia Bladder prolapse CKD, stage IV ANCA positive vasculitis on prednisone and CellCept Paroxysmal A-fib on Coumadin Pulmonary hypertension Chronic diastolic heart failure GERD Hypertension Hyperlipidemia   I reviewed nursing notes, Consultant urology notes, hospitalist notes, last 24 h vitals and pain scores, last 48 h intake and output, last 24 h labs and trends,  and last 24 h imaging results.  Jill Alexanders, PA-C [facility] 04/14/2022, 3:13 PM Please see Amion for pager number during day hours 7:00am-4:30pm or 7:00am -11:30am on weekends

## 2022-04-14 NOTE — Assessment & Plan Note (Signed)
CT imaging from May suggested the presence of a colovesical fistula.  The patient underwent CT with barium enema yesterday and general surgery consultation, and imaging helped rule out fistula, thankfully.

## 2022-04-14 NOTE — Progress Notes (Signed)
[facility] for warfarin Indication: PAF, mech AVR  Allergies  Allergen Reactions   Flagyl [Metronidazole] Rash    ALL-OVER BODY RASH   Ciprofloxacin     Other reaction(s): Stomach upset - tolerates Other reaction(s): Stomach upset - tolerates   Coreg [Carvedilol] Other (See Comments)    Terrible cramping in the feet and had a lot of bowel movements, but not diarrhea   Diflucan [Fluconazole] Diarrhea   Losartan Swelling    Patient doesn't recall site of swelling   Privigen [Immune Globulin (Human)] Other (See Comments)    Severe/excruciating pain   Sulfamethoxazole-Trimethoprim     Other reaction(s): stomach upset .   Verapamil Hives   Zetia [Ezetimibe] Other (See Comments)    Reaction not recalled   Zocor [Simvastatin - High Dose] Other (See Comments)    Reaction not recalled   Gatifloxacin Rash    Redness to skin around eye    Patient Measurements: Height: '4\' 11"'  (149.9 cm) Weight: 63.3 kg (139 lb 8.8 oz) IBW/kg (Calculated) : 43.2  Vital Signs: Temp: 98.1 F (36.7 C) (07/07 1230) Temp Source: Axillary (07/07 1230) BP: 126/68 (07/07 1230) Pulse Rate: 56 (07/07 1230)  Labs: Recent Labs    04/12/22 1140 04/12/22 1149 04/12/22 1149 04/12/22 1915 04/13/22 0534 04/14/22 0425  HGB  --  6.3*   < > 6.8* 8.1* 7.1*  HCT  --  19.5*   < > 21.2* 24.3* 21.3*  PLT  --  141*  --   --  112* 113*  LABPROT  --  21.5*  --   --  20.1* 27.9*  INR  --  1.9*  --   --  1.7* 2.6*  CREATININE 2.62*  --   --   --  2.51* 2.62*   < > = values in this interval not displayed.     Estimated Creatinine Clearance: 13.2 mL/min (A) (by C-G formula based on SCr of 2.62 mg/dL (H)).  Medications:  Medications Prior to Admission  Medication Sig Dispense Refill Last Dose   acetaminophen (TYLENOL) 325 MG tablet Take 650 mg by mouth every 6 (six) hours as needed for mild pain.    04/09/2022   alendronate (FOSAMAX) 70 MG tablet Take 70 mg by mouth every  Saturday.   03/22/2022   allopurinol (ZYLOPRIM) 100 MG tablet Take 100 mg by mouth daily.   04/12/2022   ALPRAZolam (XANAX) 0.5 MG tablet Take 0.25-0.5 mg by mouth at bedtime.   04/11/2022   amLODipine (NORVASC) 10 MG tablet TAKE 1 TABLET EVERY DAY (Patient taking differently: Take 10 mg by mouth daily.) 90 tablet 3 04/12/2022   atorvastatin (LIPITOR) 80 MG tablet Take 1 tablet (80 mg total) by mouth daily at 6 PM.   04/11/2022   Cholecalciferol (VITAMIN D3) 50 MCG (2000 UT) capsule Take 2,000 Units by mouth daily with lunch.   04/11/2022   cloNIDine (CATAPRES) 0.2 MG tablet TAKE 1 TABLET TWICE DAILY (Patient taking differently: Take 0.2 mg by mouth 2 (two) times daily.) 180 tablet 3 04/12/2022   Cyanocobalamin (VITAMIN B12) 1000 MCG TBCR Take 1,000 mcg by mouth daily with lunch.   04/11/2022   estradiol (ESTRACE) 0.1 MG/GM vaginal cream Place 0.5g nightly for two weeks then twice a week after (Patient taking differently: Place 1 Applicatorful vaginally 3 (three) times a week. Place 0.5g nightly for two weeks then twice a week after) 30 g 11 03/29/2022   famotidine (PEPCID) 20 MG tablet Take 1 tablet (20  mg total) by mouth 2 (two) times daily. 60 tablet 3 04/12/2022   furosemide (LASIX) 40 MG tablet TAKE 2 TABLETS (80 MG TOTAL) IN MORNING AND 1 AND 1/2 TABLETS (60 MG TOTAL) IN EVENING. CHANGE IN DOSAGE. (Patient taking differently: Take 40-80 mg by mouth See admin instructions. Takes 80 mg in the morning and 40 mg at night.) 315 tablet 0 04/12/2022   glipiZIDE (GLUCOTROL) 5 MG tablet Take 1 tablet (5 mg total) by mouth daily. 30 tablet 0 unk last dose   hydrALAZINE (APRESOLINE) 100 MG tablet TAKE 1 TABLET THREE TIMES DAILY (Patient taking differently: Take 100 mg by mouth 3 (three) times daily.) 270 tablet 3 04/12/2022   insulin aspart (NOVOLOG) 100 UNIT/ML injection Take 2 units for blood sugar 200-250, 4 units for blood sugar 252-300, 6 units for blood sugar 301-350, 8 units for blood sugar greater than 350 and call MD.  (Patient taking differently: Inject 2-8 Units into the skin 3 (three) times daily with meals. Take 2 units for blood sugar 200-250, 4 units for blood sugar 252-300, 6 units for blood sugar 301-350, 8 units for blood sugar greater than 350 and call MD.) 10 mL 11 04/12/2022   lactose free nutrition (BOOST) LIQD Take 237 mLs by mouth daily. (To supplement meals)    04/12/2022   lidocaine (XYLOCAINE) 2 % solution Use as directed 15 mLs in the mouth or throat as needed for mouth pain. (Patient taking differently: Use as directed 15 mLs in the mouth or throat daily as needed for mouth pain.) 100 mL 2 04/08/2022   magnesium oxide (MAG-OX) 400 (241.3 Mg) MG tablet Take 1 tablet (400 mg total) by mouth daily.   04/11/2022   metoprolol succinate (TOPROL-XL) 25 MG 24 hr tablet TAKE 1/2 TABLET EVERY DAY (Patient taking differently: Take 12.5 mg by mouth daily.) 45 tablet 3 04/12/2022 at 9 am   mycophenolate (CELLCEPT) 500 MG tablet Take 2 tablets (1,000 mg total) by mouth daily. (Patient taking differently: Take 500 mg by mouth 2 (two) times daily.) 60 tablet 1 04/12/2022   ondansetron (ZOFRAN-ODT) 4 MG disintegrating tablet Take 4 mg by mouth every 8 (eight) hours as needed for vomiting or nausea.   04/12/2022   pantoprazole (PROTONIX) 40 MG tablet Take 1 tablet (40 mg total) by mouth daily. 30 tablet 1 04/11/2022   predniSONE (DELTASONE) 10 MG tablet Take 20 mg by mouth daily with breakfast.   04/12/2022   spironolactone (ALDACTONE) 25 MG tablet TAKE 1 TABLET EVERY DAY (Patient taking differently: Take 25 mg by mouth daily.) 90 tablet 3 04/12/2022   tadalafil, PAH, (ADCIRCA) 20 MG tablet Take 2 tablets (40 mg total) by mouth daily. (Patient taking differently: Take 40 mg by mouth at bedtime.) 60 tablet 11 04/11/2022   TRESIBA FLEXTOUCH 100 UNIT/ML FlexTouch Pen Inject 14 Units into the skin at bedtime.   04/11/2022   vitamin C (ASCORBIC ACID) 500 MG tablet Take 500 mg by mouth daily with lunch.   04/11/2022   warfarin (COUMADIN) 5 MG  tablet Take 1 to 1 &1/2 tablets by mouth daily as directed by the coumadin clinic (Patient taking differently: Take 5-7.5 mg by mouth See admin instructions. 5 mg on Monday,Thursday,Saturday 7.5 mg on Wednesday, Friday and Sunday) 120 tablet 0 04/11/2022 at 5.30 pm   Blood Glucose Monitoring Suppl (TRUE METRIX METER) w/Device KIT       glucose blood (TRUE METRIX BLOOD GLUCOSE TEST) test strip Use 1-2 times daily  Insulin Syringe-Needle U-100 (B-D INS SYR ULTRAFINE .3CC/29G) 29G X 1/2" 0.3 ML MISC Use for sliding scale for breakthrough high blood sugar readings 100 each 1    TRUE METRIX BLOOD GLUCOSE TEST test strip SMARTSIG:Via Meter      vitamin C (ASCORBIC ACID) 500 MG tablet       Scheduled:   ALPRAZolam  0.25 mg Oral QHS   amLODipine  10 mg Oral Daily   atorvastatin  80 mg Oral q1800   Chlorhexidine Gluconate Cloth  6 each Topical Daily   cloNIDine  0.2 mg Oral BID   famotidine  20 mg Oral BID   furosemide  40 mg Oral QHS   furosemide  80 mg Oral Daily   hydrALAZINE  100 mg Oral TID   insulin aspart  0-15 Units Subcutaneous TID WC   insulin aspart  0-5 Units Subcutaneous QHS   insulin aspart  3 Units Subcutaneous TID WC   insulin glargine-yfgn  14 Units Subcutaneous QHS   magnesium oxide  400 mg Oral Q breakfast   metoprolol succinate  12.5 mg Oral Daily   mycophenolate  500 mg Oral BID   pantoprazole  40 mg Oral Daily   predniSONE  20 mg Oral Q breakfast   spironolactone  25 mg Oral Daily   tadalafil (PAH)  40 mg Oral QHS   Warfarin - Pharmacist Dosing Inpatient   Does not apply q1600   PRN: acetaminophen **OR** acetaminophen, fentaNYL (SUBLIMAZE) injection, oxyCODONE-acetaminophen  Assessment: 54 yoF with PMH PAF & mechanical AVR on warfarin, CAD s/p CABG 2005, PAH, DM, HTN, dCHF, vasculitis, autoimmune hemolytic anemia, admitted 7/5 for diverticulitis flare. Pharmacy consulted to continue warfarin while admitted.   Baseline INR slightly subtherapeutic Prior  anticoagulation: warfarin 5 mg daily except 7.5 mg Wed, Fri, Sun; LD 7/4  Significant events: Received PRBC on 7/3 for Hgb 6.5.  Today, 04/14/2022: CBC: Hgb low d/t hemolytic anemia but improved after PRBC x 2 overnight; Plt slightly low INR 2.6, therapeutic and up by 0.9 from yesterday, yesterday's warfarin dose 7.5 mg Major drug interactions: none; broad spectrum abx (Zosyn) can increase warfarin sensitivity No bleeding issues per nursing Diet changed to regular  Goal of Therapy: INR 2-3 (confirmed with clinic records)  Plan: Warfarin 5 mg PO today x 1 Daily INR Monitor for signs of bleeding or thrombosis    Thank you for allowing pharmacy to be a part of this patient's care.  Royetta Asal, PharmD, BCPS Clinical Pharmacist [place] Please utilize Amion for appropriate phone number to reach the unit pharmacist ([place]) 04/14/2022 1:13 PM

## 2022-04-15 DIAGNOSIS — N321 Vesicointestinal fistula: Secondary | ICD-10-CM

## 2022-04-15 DIAGNOSIS — I5032 Chronic diastolic (congestive) heart failure: Secondary | ICD-10-CM | POA: Diagnosis not present

## 2022-04-15 DIAGNOSIS — E119 Type 2 diabetes mellitus without complications: Secondary | ICD-10-CM

## 2022-04-15 DIAGNOSIS — E876 Hypokalemia: Secondary | ICD-10-CM

## 2022-04-15 DIAGNOSIS — D591 Autoimmune hemolytic anemia, unspecified: Secondary | ICD-10-CM | POA: Diagnosis not present

## 2022-04-15 DIAGNOSIS — K5792 Diverticulitis of intestine, part unspecified, without perforation or abscess without bleeding: Secondary | ICD-10-CM | POA: Diagnosis not present

## 2022-04-15 DIAGNOSIS — N3080 Other cystitis without hematuria: Secondary | ICD-10-CM | POA: Diagnosis not present

## 2022-04-15 LAB — CBC
HCT: 27.4 % — ABNORMAL LOW (ref 36.0–46.0)
Hemoglobin: 9.3 g/dL — ABNORMAL LOW (ref 12.0–15.0)
MCH: 30.6 pg (ref 26.0–34.0)
MCHC: 33.9 g/dL (ref 30.0–36.0)
MCV: 90.1 fL (ref 80.0–100.0)
Platelets: 115 10*3/uL — ABNORMAL LOW (ref 150–400)
RBC: 3.04 MIL/uL — ABNORMAL LOW (ref 3.87–5.11)
RDW: 17.5 % — ABNORMAL HIGH (ref 11.5–15.5)
WBC: 5.6 10*3/uL (ref 4.0–10.5)
nRBC: 0 % (ref 0.0–0.2)

## 2022-04-15 LAB — TYPE AND SCREEN
ABO/RH(D): A POS
Antibody Screen: NEGATIVE
Unit division: 0
Unit division: 0
Unit division: 0

## 2022-04-15 LAB — PROTIME-INR
INR: 3.2 — ABNORMAL HIGH (ref 0.8–1.2)
Prothrombin Time: 32.2 seconds — ABNORMAL HIGH (ref 11.4–15.2)

## 2022-04-15 LAB — GLUCOSE, CAPILLARY
Glucose-Capillary: 141 mg/dL — ABNORMAL HIGH (ref 70–99)
Glucose-Capillary: 169 mg/dL — ABNORMAL HIGH (ref 70–99)
Glucose-Capillary: 216 mg/dL — ABNORMAL HIGH (ref 70–99)
Glucose-Capillary: 340 mg/dL — ABNORMAL HIGH (ref 70–99)

## 2022-04-15 LAB — URINE CULTURE: Culture: NO GROWTH

## 2022-04-15 LAB — BASIC METABOLIC PANEL
Anion gap: 13 (ref 5–15)
BUN: 110 mg/dL — ABNORMAL HIGH (ref 8–23)
CO2: 22 mmol/L (ref 22–32)
Calcium: 8.1 mg/dL — ABNORMAL LOW (ref 8.9–10.3)
Chloride: 103 mmol/L (ref 98–111)
Creatinine, Ser: 2.66 mg/dL — ABNORMAL HIGH (ref 0.44–1.00)
GFR, Estimated: 17 mL/min — ABNORMAL LOW (ref >60)
Glucose, Bld: 95 mg/dL (ref 70–99)
Potassium: 3.4 mmol/L — ABNORMAL LOW (ref 3.5–5.1)
Sodium: 138 mmol/L (ref 135–145)

## 2022-04-15 LAB — BPAM RBC
Blood Product Expiration Date: 202307202359
Blood Product Expiration Date: 202307262359
Blood Product Expiration Date: 202307272359
ISSUE DATE / TIME: 202307051500
ISSUE DATE / TIME: 202307052353
ISSUE DATE / TIME: 202307071208
Unit Type and Rh: 6200
Unit Type and Rh: 6200
Unit Type and Rh: 6200

## 2022-04-15 LAB — OCCULT BLOOD X 1 CARD TO LAB, STOOL: Fecal Occult Bld: POSITIVE — AB

## 2022-04-15 LAB — RETICULOCYTES
Immature Retic Fract: 12.1 % (ref 2.3–15.9)
RBC.: 3.03 MIL/uL — ABNORMAL LOW (ref 3.87–5.11)
Retic Count, Absolute: 165.1 10*3/uL (ref 19.0–186.0)
Retic Ct Pct: 5.5 % — ABNORMAL HIGH (ref 0.4–3.1)

## 2022-04-15 MED ORDER — ONDANSETRON HCL 4 MG/2ML IJ SOLN
4.0000 mg | Freq: Four times a day (QID) | INTRAMUSCULAR | Status: DC | PRN
  Administered 2022-04-15: 4 mg via INTRAVENOUS
  Filled 2022-04-15: qty 2

## 2022-04-15 MED ORDER — SIMETHICONE 40 MG/0.6ML PO SUSP
40.0000 mg | Freq: Four times a day (QID) | ORAL | Status: DC | PRN
  Administered 2022-04-15 – 2022-04-16 (×2): 40 mg via ORAL
  Filled 2022-04-15 (×3): qty 0.6

## 2022-04-15 MED ORDER — POTASSIUM CHLORIDE 20 MEQ PO PACK
60.0000 meq | PACK | Freq: Once | ORAL | Status: AC
  Administered 2022-04-15: 60 meq via ORAL
  Filled 2022-04-15: qty 3

## 2022-04-15 MED ORDER — WARFARIN SODIUM 2.5 MG PO TABS
2.5000 mg | ORAL_TABLET | Freq: Once | ORAL | Status: AC
Start: 2022-04-15 — End: 2022-04-15
  Administered 2022-04-15: 2.5 mg via ORAL
  Filled 2022-04-15: qty 1

## 2022-04-15 NOTE — Progress Notes (Signed)
[facility] for warfarin Indication: PAF, mech AVR  Allergies  Allergen Reactions   Flagyl [Metronidazole] Rash    ALL-OVER BODY RASH   Ciprofloxacin     Other reaction(s): Stomach upset - tolerates Other reaction(s): Stomach upset - tolerates   Coreg [Carvedilol] Other (See Comments)    Terrible cramping in the feet and had a lot of bowel movements, but not diarrhea   Diflucan [Fluconazole] Diarrhea   Losartan Swelling    Patient doesn't recall site of swelling   Privigen [Immune Globulin (Human)] Other (See Comments)    Severe/excruciating pain   Sulfamethoxazole-Trimethoprim     Other reaction(s): stomach upset .   Verapamil Hives   Zetia [Ezetimibe] Other (See Comments)    Reaction not recalled   Zocor [Simvastatin - High Dose] Other (See Comments)    Reaction not recalled   Gatifloxacin Rash    Redness to skin around eye    Patient Measurements: Height: 4\' 11"  (149.9 cm) Weight: 61.2 kg (134 lb 14.7 oz) IBW/kg (Calculated) : 43.2  Vital Signs: Temp: 98.4 F (36.9 C) (07/08 0443) Temp Source: Oral (07/08 0443) BP: 142/53 (07/08 0443) Pulse Rate: 61 (07/08 0443)  Labs: Recent Labs    04/13/22 0534 04/14/22 0425 04/15/22 0922  HGB 8.1* 7.1* 9.3*  HCT 24.3* 21.3* 27.4*  PLT 112* 113* 115*  LABPROT 20.1* 27.9* 32.2*  INR 1.7* 2.6* 3.2*  CREATININE 2.51* 2.62* 2.66*     Estimated Creatinine Clearance: 12.8 mL/min (A) (by C-G formula based on SCr of 2.66 mg/dL (H)).  Medications:  Medications Prior to Admission  Medication Sig Dispense Refill Last Dose   acetaminophen (TYLENOL) 325 MG tablet Take 650 mg by mouth every 6 (six) hours as needed for mild pain.    04/09/2022   alendronate (FOSAMAX) 70 MG tablet Take 70 mg by mouth every Saturday.   03/22/2022   allopurinol (ZYLOPRIM) 100 MG tablet Take 100 mg by mouth daily.   04/12/2022   ALPRAZolam (XANAX) 0.5 MG tablet Take 0.25-0.5 mg by mouth at bedtime.   04/11/2022    amLODipine (NORVASC) 10 MG tablet TAKE 1 TABLET EVERY DAY (Patient taking differently: Take 10 mg by mouth daily.) 90 tablet 3 04/12/2022   atorvastatin (LIPITOR) 80 MG tablet Take 1 tablet (80 mg total) by mouth daily at 6 PM.   04/11/2022   Cholecalciferol (VITAMIN D3) 50 MCG (2000 UT) capsule Take 2,000 Units by mouth daily with lunch.   04/11/2022   cloNIDine (CATAPRES) 0.2 MG tablet TAKE 1 TABLET TWICE DAILY (Patient taking differently: Take 0.2 mg by mouth 2 (two) times daily.) 180 tablet 3 04/12/2022   Cyanocobalamin (VITAMIN B12) 1000 MCG TBCR Take 1,000 mcg by mouth daily with lunch.   04/11/2022   estradiol (ESTRACE) 0.1 MG/GM vaginal cream Place 0.5g nightly for two weeks then twice a week after (Patient taking differently: Place 1 Applicatorful vaginally 3 (three) times a week. Place 0.5g nightly for two weeks then twice a week after) 30 g 11 03/29/2022   famotidine (PEPCID) 20 MG tablet Take 1 tablet (20 mg total) by mouth 2 (two) times daily. 60 tablet 3 04/12/2022   furosemide (LASIX) 40 MG tablet TAKE 2 TABLETS (80 MG TOTAL) IN MORNING AND 1 AND 1/2 TABLETS (60 MG TOTAL) IN EVENING. CHANGE IN DOSAGE. (Patient taking differently: Take 40-80 mg by mouth See admin instructions. Takes 80 mg in the morning and 40 mg at night.) 315 tablet 0 04/12/2022   glipiZIDE (  GLUCOTROL) 5 MG tablet Take 1 tablet (5 mg total) by mouth daily. 30 tablet 0 unk last dose   hydrALAZINE (APRESOLINE) 100 MG tablet TAKE 1 TABLET THREE TIMES DAILY (Patient taking differently: Take 100 mg by mouth 3 (three) times daily.) 270 tablet 3 04/12/2022   insulin aspart (NOVOLOG) 100 UNIT/ML injection Take 2 units for blood sugar 200-250, 4 units for blood sugar 252-300, 6 units for blood sugar 301-350, 8 units for blood sugar greater than 350 and call MD. (Patient taking differently: Inject 2-8 Units into the skin 3 (three) times daily with meals. Take 2 units for blood sugar 200-250, 4 units for blood sugar 252-300, 6 units for blood sugar  301-350, 8 units for blood sugar greater than 350 and call MD.) 10 mL 11 04/12/2022   lactose free nutrition (BOOST) LIQD Take 237 mLs by mouth daily. (To supplement meals)    04/12/2022   lidocaine (XYLOCAINE) 2 % solution Use as directed 15 mLs in the mouth or throat as needed for mouth pain. (Patient taking differently: Use as directed 15 mLs in the mouth or throat daily as needed for mouth pain.) 100 mL 2 04/08/2022   magnesium oxide (MAG-OX) 400 (241.3 Mg) MG tablet Take 1 tablet (400 mg total) by mouth daily.   04/11/2022   metoprolol succinate (TOPROL-XL) 25 MG 24 hr tablet TAKE 1/2 TABLET EVERY DAY (Patient taking differently: Take 12.5 mg by mouth daily.) 45 tablet 3 04/12/2022 at 9 am   mycophenolate (CELLCEPT) 500 MG tablet Take 2 tablets (1,000 mg total) by mouth daily. (Patient taking differently: Take 500 mg by mouth 2 (two) times daily.) 60 tablet 1 04/12/2022   ondansetron (ZOFRAN-ODT) 4 MG disintegrating tablet Take 4 mg by mouth every 8 (eight) hours as needed for vomiting or nausea.   04/12/2022   pantoprazole (PROTONIX) 40 MG tablet Take 1 tablet (40 mg total) by mouth daily. 30 tablet 1 04/11/2022   predniSONE (DELTASONE) 10 MG tablet Take 20 mg by mouth daily with breakfast.   04/12/2022   spironolactone (ALDACTONE) 25 MG tablet TAKE 1 TABLET EVERY DAY (Patient taking differently: Take 25 mg by mouth daily.) 90 tablet 3 04/12/2022   tadalafil, PAH, (ADCIRCA) 20 MG tablet Take 2 tablets (40 mg total) by mouth daily. (Patient taking differently: Take 40 mg by mouth at bedtime.) 60 tablet 11 04/11/2022   TRESIBA FLEXTOUCH 100 UNIT/ML FlexTouch Pen Inject 14 Units into the skin at bedtime.   04/11/2022   vitamin C (ASCORBIC ACID) 500 MG tablet Take 500 mg by mouth daily with lunch.   04/11/2022   warfarin (COUMADIN) 5 MG tablet Take 1 to 1 &1/2 tablets by mouth daily as directed by the coumadin clinic (Patient taking differently: Take 5-7.5 mg by mouth See admin instructions. 5 mg on  Monday,Thursday,Saturday 7.5 mg on Wednesday, Friday and Sunday) 120 tablet 0 04/11/2022 at 5.30 pm   Blood Glucose Monitoring Suppl (TRUE METRIX METER) w/Device KIT       glucose blood (TRUE METRIX BLOOD GLUCOSE TEST) test strip Use 1-2 times daily      Insulin Syringe-Needle U-100 (B-D INS SYR ULTRAFINE .3CC/29G) 29G X 1/2" 0.3 ML MISC Use for sliding scale for breakthrough high blood sugar readings 100 each 1    TRUE METRIX BLOOD GLUCOSE TEST test strip SMARTSIG:Via Meter      vitamin C (ASCORBIC ACID) 500 MG tablet       Scheduled:   ALPRAZolam  0.25 mg Oral QHS  amLODipine  10 mg Oral Daily   atorvastatin  80 mg Oral q1800   Chlorhexidine Gluconate Cloth  6 each Topical Daily   cloNIDine  0.2 mg Oral BID   famotidine  20 mg Oral Daily   furosemide  40 mg Oral QHS   furosemide  80 mg Oral Daily   hydrALAZINE  100 mg Oral TID   insulin aspart  0-15 Units Subcutaneous TID WC   insulin aspart  0-5 Units Subcutaneous QHS   insulin aspart  3 Units Subcutaneous TID WC   insulin glargine-yfgn  14 Units Subcutaneous QHS   magnesium oxide  400 mg Oral Q breakfast   metoprolol succinate  12.5 mg Oral Daily   mycophenolate  500 mg Oral BID   pantoprazole  40 mg Oral Daily   predniSONE  20 mg Oral Q breakfast   spironolactone  25 mg Oral Daily   tadalafil (PAH)  40 mg Oral QHS   Warfarin - Pharmacist Dosing Inpatient   Does not apply q1600   PRN: acetaminophen **OR** acetaminophen, fentaNYL (SUBLIMAZE) injection, ondansetron (ZOFRAN) IV, oxyCODONE-acetaminophen  Assessment: 32 yoF with PMH PAF & mechanical AVR on warfarin, CAD s/p CABG 2005, PAH, DM, HTN, dCHF, vasculitis, autoimmune hemolytic anemia, admitted 7/5 for diverticulitis flare. Pharmacy consulted to continue warfarin while admitted.   Baseline INR slightly subtherapeutic Prior anticoagulation: warfarin 5 mg daily except 7.5 mg Wed, Fri, Sun; LD 7/4  Significant events: Received PRBC on 7/3 for Hgb 6.5.  Today,  04/15/2022: CBC: Hgb low d/t hemolytic anemia but improved after PRBC x 2 overnight; Plt slightly low INR 3.2, supra-therapeutic and up by 0.6 from yesterday, yesterday's warfarin dose 5 mg Major drug interactions: none; broad spectrum abx (Zosyn) can increase warfarin sensitivity No bleeding issues per nursing Diet changed to regular  Goal of Therapy: INR 2-3 (confirmed with clinic records)  Plan: Warfarin 2.5 mg PO today x 1 Daily INR Monitor for signs of bleeding or thrombosis    Thank you for allowing pharmacy to be a part of this patient's care.  Royetta Asal, PharmD, BCPS Clinical Pharmacist [place] Please utilize Amion for appropriate phone number to reach the unit pharmacist ([place]) 04/15/2022 11:05 AM

## 2022-04-15 NOTE — Evaluation (Signed)
Physical Therapy Evaluation Patient Details Name: [name] MRN: [id number] DOB: June 06, 1941 Today's Date: 04/15/2022  History of Present Illness  Patient is an 81 y.o. 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 CT imaging from May suggested the presence of a colovesical fistula, CT completed during hospital stay indicate no signs of diverticulitis or fistula with rectal contrast.    Clinical Impression  [name] is 81 y.o. female admitted with above HPI and diagnosis. Patient is currently limited by functional impairments below (see PT problem list). Patient lives alone and is modified independent with RW for household mobility at baseline. She reports she does fatigue when trying to go full length of house. She currently requires Min assist/guard for transfers and gait with RW and supervision for bed mobility. Pt has a friend/neighbor who stays at night with her and can assist during the day as needed. Patient will benefit from continued skilled PT interventions to address impairments and progress independence with mobility, recommending HHPT. Acute PT will follow and progress as able.        Recommendations for follow up therapy are one component of a multi-disciplinary discharge planning process, led by the attending physician.  Recommendations may be updated based on patient status, additional functional criteria and insurance authorization.  Follow Up Recommendations Home health PT      Assistance Recommended at Discharge Frequent or constant Supervision/Assistance  Patient can return home with the following  A little help with walking and/or transfers    Equipment Recommendations Rollator (4 wheels)  Recommendations for Other Services       Functional Status  Assessment Patient has had a recent decline in their functional status and demonstrates the ability to make significant improvements in function in a reasonable and predictable amount of time.     Precautions / Restrictions Precautions Precautions: Fall Restrictions Weight Bearing Restrictions: No      Mobility  Bed Mobility Overal bed mobility: Needs Assistance Bed Mobility: Supine to Sit, Sit to Supine     Supine to sit: Min guard, HOB elevated Sit to supine: Min guard, HOB elevated   General bed mobility comments: pt taking extra time    Transfers Overall transfer level: Needs assistance Equipment used: Rolling walker (2 wheels) Transfers: Sit to/from Stand Sit to Stand: Min guard           General transfer comment: extra time to set up/position and complete rise.    Ambulation/Gait Ambulation/Gait assistance: Min guard Gait Distance (Feet): 40 Feet Assistive device: Rolling walker (2 wheels) Gait Pattern/deviations: Step-through pattern, Decreased stride length Gait velocity: decr     General Gait Details: guarding for safety, cues to navigate room and turns, assist for iv pole. pt steady with slow cautoius gait, no LOB.  Stairs            Wheelchair Mobility    Modified Rankin (Stroke Patients Only)       Balance Overall balance assessment: Needs assistance Sitting-balance support: Feet supported Sitting balance-Leahy Scale: Good     Standing balance support: Reliant on assistive device for balance, During functional activity, Bilateral upper extremity supported Standing balance-Leahy Scale: Poor  Pertinent Vitals/Pain Pain Assessment Pain Assessment: No/denies pain    Home Living Family/patient expects to be discharged to:: Private residence Living Arrangements: Alone Available Help at Discharge: Family;Available 24 hours/day Type of Home: House Home Access: Stairs to enter Entrance  Stairs-Rails: Psychiatric nurse of Steps: 3   Home Layout: One level Home Equipment: Conservation officer, nature (2 wheels);Cane - single point;Shower seat;Crutches;Grab bars - toilet;Wheelchair - manual Additional Comments: uses RW at home for gait, was supposed to get Rollator but didn't get one.    Prior Function Prior Level of Function : Independent/Modified Independent             Mobility Comments: has been able to walk and handle her home and self care ADLs Comments: independent with ADL, family assists with grocery shopping, pt reports she was independent with cooking, medication and would drive occassionally     Hand Dominance   Dominant Hand: Right    Extremity/Trunk Assessment   Upper Extremity Assessment Upper Extremity Assessment: Overall WFL for tasks assessed    Lower Extremity Assessment Lower Extremity Assessment: Generalized weakness    Cervical / Trunk Assessment Cervical / Trunk Assessment: Normal  Communication   Communication: No difficulties  Cognition Arousal/Alertness: Awake/alert Behavior During Therapy: WFL for tasks assessed/performed Overall Cognitive Status: Within Functional Limits for tasks assessed                                          General Comments      Exercises Other Exercises Other Exercises: 10 reps bil LE LAQ Other Exercises: 10 reps bil LE marching seated Other Exercises: 2x5 reps sit<>stand, no UE use   Assessment/Plan    PT Assessment Patient needs continued PT services  PT Problem List Decreased strength;Decreased activity tolerance;Decreased balance;Decreased mobility;Decreased knowledge of precautions;Decreased safety awareness       PT Treatment Interventions DME instruction;Gait training;Stair training;Functional mobility training;Therapeutic activities;Therapeutic exercise;Balance training;Patient/family education    PT Goals (Current goals can be found in the Care Plan section)   Acute Rehab PT Goals Patient Stated Goal: get home PT Goal Formulation: With patient Time For Goal Achievement: 04/29/22 Potential to Achieve Goals: Good    Frequency Min 3X/week     Co-evaluation               AM-PAC PT "6 Clicks" Mobility  Outcome Measure Help needed turning from your back to your side while in a flat bed without using bedrails?: A Little Help needed moving from lying on your back to sitting on the side of a flat bed without using bedrails?: A Little Help needed moving to and from a bed to a chair (including a wheelchair)?: A Little Help needed standing up from a chair using your arms (e.g., wheelchair or bedside chair)?: A Little Help needed to walk in hospital room?: A Little Help needed climbing 3-5 steps with a railing? : A Little 6 Click Score: 18    End of Session Equipment Utilized During Treatment: Gait belt Activity Tolerance: Patient tolerated treatment well Patient left: in bed;with call bell/phone within reach (chair position) Nurse Communication: Mobility status PT Visit Diagnosis: Unsteadiness on feet (R26.81);Difficulty in walking, not elsewhere classified (R26.2);Muscle weakness (generalized) (M62.81)    Time: 1610-9604 PT Time Calculation (min) (ACUTE ONLY): 33 min   Charges:   PT Evaluation $PT Eval Low Complexity: 1 Low PT Treatments $Therapeutic Exercise: 8-22 mins  Verner Mould, DPT Acute Rehabilitation Services Office [phone] Pager [phone]  04/15/22 2:33 PM

## 2022-04-15 NOTE — Assessment & Plan Note (Addendum)
Potassium down to 3.0 - Supplement K again

## 2022-04-15 NOTE — Progress Notes (Signed)
Emphysematous cystitis  Subjective: No acute issues overnight  Objective: Vital signs in last 24 hours: Temp:  [97.8 F (36.6 C)-98.4 F (36.9 C)] 98.4 F (36.9 C) (07/08 0443) Pulse Rate:  [56-73] 61 (07/08 0443) Resp:  [16-20] 17 (07/08 0443) BP: (116-145)/(41-68) 142/53 (07/08 0443) SpO2:  [97 %-100 %] 100 % (07/08 0443) Weight:  [61.2 kg] 61.2 kg (07/08 0444) Last BM Date : 04/14/22  Intake/Output from previous day: 07/07 0701 - 07/08 0700 In: 1950 [P.O.:1320; Blood:380; IV Piggyback:250] Out: 2900 [Urine:2900] Intake/Output this shift: No intake/output data recorded.  General appearance: appears stated age and no distress GI: soft  Lab Results:  Results for orders placed or performed during the hospital encounter of 04/12/22 (from the past 24 hour(s))  Prepare RBC (crossmatch)     Status: None   Collection Time: 04/14/22 10:14 AM  Result Value Ref Range   Order Confirmation      ORDER PROCESSED BY BLOOD BANK Performed at [facility] [address] [place],  [address]   Glucose, capillary     Status: Abnormal   Collection Time: 04/14/22 11:47 AM  Result Value Ref Range   Glucose-Capillary 240 (H) 70 - 99 mg/dL  Glucose, capillary     Status: Abnormal   Collection Time: 04/14/22  4:47 PM  Result Value Ref Range   Glucose-Capillary 441 (H) 70 - 99 mg/dL  Glucose, capillary     Status: Abnormal   Collection Time: 04/14/22  9:07 PM  Result Value Ref Range   Glucose-Capillary 283 (H) 70 - 99 mg/dL  Occult blood card to lab, stool RN will collect     Status: Abnormal   Collection Time: 04/15/22  4:28 AM  Result Value Ref Range   Fecal Occult Bld POSITIVE (A) NEGATIVE  Glucose, capillary     Status: Abnormal   Collection Time: 04/15/22  7:39 AM  Result Value Ref Range   Glucose-Capillary 141 (H) 70 - 99 mg/dL   *Note: Due to a large number of results and/or encounters for the requested time period, some results have not been  displayed. A complete set of results can be found in Results Review.     Studies/Results Radiology     MEDS, Scheduled  ALPRAZolam  0.25 mg Oral QHS   amLODipine  10 mg Oral Daily   atorvastatin  80 mg Oral q1800   Chlorhexidine Gluconate Cloth  6 each Topical Daily   cloNIDine  0.2 mg Oral BID   famotidine  20 mg Oral Daily   furosemide  40 mg Oral QHS   furosemide  80 mg Oral Daily   hydrALAZINE  100 mg Oral TID   insulin aspart  0-15 Units Subcutaneous TID WC   insulin aspart  0-5 Units Subcutaneous QHS   insulin aspart  3 Units Subcutaneous TID WC   insulin glargine-yfgn  14 Units Subcutaneous QHS   magnesium oxide  400 mg Oral Q breakfast   metoprolol succinate  12.5 mg Oral Daily   mycophenolate  500 mg Oral BID   pantoprazole  40 mg Oral Daily   predniSONE  20 mg Oral Q breakfast   spironolactone  25 mg Oral Daily   tadalafil (PAH)  40 mg Oral QHS   Warfarin - Pharmacist Dosing Inpatient   Does not apply q1600     Assessment: Emphysematous cystitis CT personally reviewed.  No signs of diverticulitis or fistula with rectal contrast.    Plan: No surgical issues can be  delineated in this complicated medical patient.  Will sign off.     LOS: 3 days    Rosario Adie, MD [facility]  Patient's medical decision making was straightforward (35 mins met or exceeded with patient care and documentation).   04/15/2022 7:50 AM

## 2022-04-15 NOTE — Progress Notes (Signed)
Progress Note   Patient: [name] IDP:[id number] DOB: [birth year] DOA: 04/12/2022     3 DOS: the patient was seen and examined on 04/15/2022 at 2:45 PM      Brief hospital course: Mrs. Haltiwanger is an 81 y.o. 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  In the ER, CT showed emphysematous cystitis, possible diverticulitis.  Foley placed, started on antibiotics and admitted.     Assessment and Plan: * Emphysematous cystitis Reviewed with radiology, low degree of prolapse on imaging, does clearly appear to be emphysematous cystitis.  Pelvic bladder prolapse and mycophenolate both predispose to infection.   Sepsis ruled out.  Diverticulitis ruled out.  Afebrile, respiratory rate and heart rate normal.  Urine culture still pending - Continue antibiotics - Follow urine culture - Maintain Foley 7 days and outpatient follow-up with urology       Autoimmune hemolytic anemia ([place]) Follows with hematology.  Hemoglobin 6.3 on admission -> transfused 1 unit -> 8.1 -> 7.1 -> transfused 2nd unit -> 9.3 today  - Continue mycophenolate, prednisone - Consult oncology, appreciate recommendations  Colovesical fistula, ruled out CT imaging from May suggested the presence of a colovesical fistula.  The patient underwent CT with barium enema yesterday and general surgery consultation, and imaging helped rule out fistula, thankfully.    Hypokalemia Potassium down to 3.0 - Supplement K again  Female bladder prolapse - Continue tadalafil  Subtherapeutic international normalized ratio (INR) INR subtherapeutic on admission, today up to 3.2 - Continue warfarin  CKD (chronic kidney disease), stage IV (HCC) Creatinine stable relative to baseline 2.4-2.6  Vasculitis, ANCA positive - Continue  mycophenolate, prednisone  PAF (paroxysmal atrial fibrillation) (HCC) In the presence of Saint Jude mechanical valve.  Rate controlled -Continue warfarin - Continue metoprolol, clonidine  Moderate to severe pulmonary hypertension (HCC) - Continue home spironolactone and furosemide  Chronic diastolic CHF (congestive heart failure) (HCC) Appears relatively euvolemic - Continue furosemide spironolactone  GERD (gastroesophageal reflux disease) - Continue famotidine and pantoprazole  Thrombocytopenia (HCC) Platelets stable  Diabetes mellitus without complication (HCC) With hyperglycemia Today glucoses better controlled -Continue glargine - Continue sliding scale corrections     Hypercholesteremia - Continue atorvastatin  Hypertension Blood pressure controlled - Continue amlodipine, clonidine, furosemide, hydralazine, metoprolol, spironolactone          Subjective: Patient is having some gas pains since her CT barium enema yesterday no fever, no hematuria, no confusion, no respiratory status changes.     Physical Exam: Vitals:   04/14/22 2055 04/15/22 0443 04/15/22 0444 04/15/22 1226  BP: (!) 145/47 (!) 142/53  (!) 127/48  Pulse: 73 61  (!) 49  Resp: '16 17  16  '$ Temp: 98 F (36.7 C) 98.4 F (36.9 C)  97.9 F (36.6 C)  TempSrc: Oral Oral  Oral  SpO2: 98% 100%  99%  Weight:   61.2 kg   Height:       Elderly adult female, lying in bed, no acute distress RRR, soft murmur, mechanical aortic valve click, 1+ edema to the midshin bilaterally, unchanged from yesterday Respiratory status normal, lungs clear without rales or wheezes Abdomen some mild tenderness in the left lower quadrant, mild voluntary guarding, no ascites or distention Attention normal, affect normal, judgment and insight appear normal, oriented to person, place, time, and situation, face  symmetric, speech fluent  Data Reviewed: Basic metabolic panel notable for creatinine 2.6, no change from  previous Potassium 3.4 Urine culture pending Hemoglobin went hemograms up to 9.3, white blood cells and platelets normal  Family Communication: Son by phone    Disposition: Status is: Inpatient The patient was admitted for urinary tract infection and emphysematous cystitis.  The bladder has been decompressed by Foley, and she is improving on IV antibiotics.  When culture results return, hopefully tomorrow we will discharge home        Author: Edwin Dada, MD 04/15/2022 3:38 PM  For on call review [email or web address].

## 2022-04-16 DIAGNOSIS — D591 Autoimmune hemolytic anemia, unspecified: Secondary | ICD-10-CM | POA: Diagnosis not present

## 2022-04-16 DIAGNOSIS — K5792 Diverticulitis of intestine, part unspecified, without perforation or abscess without bleeding: Secondary | ICD-10-CM | POA: Diagnosis not present

## 2022-04-16 DIAGNOSIS — N3080 Other cystitis without hematuria: Secondary | ICD-10-CM | POA: Diagnosis not present

## 2022-04-16 DIAGNOSIS — I5032 Chronic diastolic (congestive) heart failure: Secondary | ICD-10-CM | POA: Diagnosis not present

## 2022-04-16 LAB — CBC
HCT: 25.5 % — ABNORMAL LOW (ref 36.0–46.0)
Hemoglobin: 8.4 g/dL — ABNORMAL LOW (ref 12.0–15.0)
MCH: 30.8 pg (ref 26.0–34.0)
MCHC: 32.9 g/dL (ref 30.0–36.0)
MCV: 93.4 fL (ref 80.0–100.0)
Platelets: 101 10*3/uL — ABNORMAL LOW (ref 150–400)
RBC: 2.73 MIL/uL — ABNORMAL LOW (ref 3.87–5.11)
RDW: 17.3 % — ABNORMAL HIGH (ref 11.5–15.5)
WBC: 4.1 10*3/uL (ref 4.0–10.5)
nRBC: 0 % (ref 0.0–0.2)

## 2022-04-16 LAB — GLUCOSE, CAPILLARY
Glucose-Capillary: 237 mg/dL — ABNORMAL HIGH (ref 70–99)
Glucose-Capillary: 224 mg/dL — ABNORMAL HIGH (ref 70–99)

## 2022-04-16 LAB — BASIC METABOLIC PANEL
Anion gap: 9 (ref 5–15)
BUN: 100 mg/dL — ABNORMAL HIGH (ref 8–23)
CO2: 22 mmol/L (ref 22–32)
Calcium: 7.9 mg/dL — ABNORMAL LOW (ref 8.9–10.3)
Chloride: 107 mmol/L (ref 98–111)
Creatinine, Ser: 2.65 mg/dL — ABNORMAL HIGH (ref 0.44–1.00)
GFR, Estimated: 17 mL/min — ABNORMAL LOW (ref >60)
Glucose, Bld: 250 mg/dL — ABNORMAL HIGH (ref 70–99)
Potassium: 4.2 mmol/L (ref 3.5–5.1)
Sodium: 138 mmol/L (ref 135–145)

## 2022-04-16 LAB — RETICULOCYTES
Immature Retic Fract: 9 % (ref 2.3–15.9)
RBC.: 2.8 MIL/uL — ABNORMAL LOW (ref 3.87–5.11)
Retic Count, Absolute: 154.6 10*3/uL (ref 19.0–186.0)
Retic Ct Pct: 5.5 % — ABNORMAL HIGH (ref 0.4–3.1)

## 2022-04-16 LAB — PROTIME-INR
INR: 2.9 — ABNORMAL HIGH (ref 0.8–1.2)
Prothrombin Time: 29.7 seconds — ABNORMAL HIGH (ref 11.4–15.2)

## 2022-04-16 MED ORDER — AMOXICILLIN-POT CLAVULANATE 500-125 MG PO TABS: 1.0000 | ORAL_TABLET | Freq: Two times a day (BID) | ORAL | 0 refills | Status: DC

## 2022-04-16 MED ORDER — AMOXICILLIN-POT CLAVULANATE 875-125 MG PO TABS: 1.0000 | ORAL_TABLET | Freq: Two times a day (BID) | ORAL | Status: DC

## 2022-04-16 MED ORDER — AMOXICILLIN-POT CLAVULANATE 500-125 MG PO TABS
1.0000 | ORAL_TABLET | Freq: Two times a day (BID) | ORAL | Status: DC
  Administered 2022-04-16: 500 mg via ORAL
  Filled 2022-04-16: qty 1

## 2022-04-16 MED ORDER — ORAL CARE MOUTH RINSE: 15.0000 mL | OROMUCOSAL | Status: DC | PRN

## 2022-04-16 MED ORDER — SIMETHICONE 80 MG PO CHEW: 80.0000 mg | CHEWABLE_TABLET | Freq: Four times a day (QID) | ORAL | 2 refills | Status: DC | PRN

## 2022-04-16 MED ORDER — WARFARIN SODIUM 2.5 MG PO TABS: 2.5000 mg | ORAL_TABLET | Freq: Once | ORAL | Status: DC

## 2022-04-16 NOTE — Progress Notes (Signed)
[place] for warfarin Indication: PAF, mech AVR  Allergies  Allergen Reactions   Flagyl [Metronidazole] Rash    ALL-OVER BODY RASH   Ciprofloxacin     Other reaction(s): Stomach upset - tolerates Other reaction(s): Stomach upset - tolerates   Coreg [Carvedilol] Other (See Comments)    Terrible cramping in the feet and had a lot of bowel movements, but not diarrhea   Diflucan [Fluconazole] Diarrhea   Losartan Swelling    Patient doesn't recall site of swelling   Privigen [Immune Globulin (Human)] Other (See Comments)    Severe/excruciating pain   Sulfamethoxazole-Trimethoprim     Other reaction(s): stomach upset .   Verapamil Hives   Zetia [Ezetimibe] Other (See Comments)    Reaction not recalled   Zocor [Simvastatin - High Dose] Other (See Comments)    Reaction not recalled   Gatifloxacin Rash    Redness to skin around eye    Patient Measurements: Height: _0  (149.9 cm) Weight: 59.6 kg (131 lb 6.4 oz) IBW/kg (Calculated) : 43.2  Vital Signs: Temp: 98 F (36.7 C) (07/09 1126) Temp Source: Oral (07/09 1126) BP: 129/51 (07/09 1126) Pulse Rate: 58 (07/09 1126)  Labs: Recent Labs    04/14/22 0425 04/15/22 0922 04/16/22 0343  HGB 7.1* 9.3* 8.4*  HCT 21.3* 27.4* 25.5*  PLT 113* 115* 101*  LABPROT 27.9* 32.2* 29.7*  INR 2.6* 3.2* 2.9*  CREATININE 2.62* 2.66* 2.65*     Estimated Creatinine Clearance: 12.6 mL/min (A) (by C-G formula based on SCr of 2.65 mg/dL (H)).  Medications:  Medications Prior to Admission  Medication Sig Dispense Refill Last Dose   acetaminophen (TYLENOL) 325 MG tablet Take 650 mg by mouth every 6 (six) hours as needed for mild pain.    04/09/2022   alendronate (FOSAMAX) 70 MG tablet Take 70 mg by mouth every Saturday.   03/22/2022   allopurinol (ZYLOPRIM) 100 MG tablet Take 100 mg by mouth daily.   04/12/2022   ALPRAZolam (XANAX) 0.5 MG tablet Take 0.25-0.5 mg by mouth at bedtime.   04/11/2022    amLODipine (NORVASC) 10 MG tablet TAKE 1 TABLET EVERY DAY (Patient taking differently: Take 10 mg by mouth daily.) 90 tablet 3 04/12/2022   atorvastatin (LIPITOR) 80 MG tablet Take 1 tablet (80 mg total) by mouth daily at 6 PM.   04/11/2022   Cholecalciferol (VITAMIN D3) 50 MCG (2000 UT) capsule Take 2,000 Units by mouth daily with lunch.   04/11/2022   cloNIDine (CATAPRES) 0.2 MG tablet TAKE 1 TABLET TWICE DAILY (Patient taking differently: Take 0.2 mg by mouth 2 (two) times daily.) 180 tablet 3 04/12/2022   Cyanocobalamin (VITAMIN B12) 1000 MCG TBCR Take 1,000 mcg by mouth daily with lunch.   04/11/2022   estradiol (ESTRACE) 0.1 MG/GM vaginal cream Place 0.5g nightly for two weeks then twice a week after (Patient taking differently: Place 1 Applicatorful vaginally 3 (three) times a week. Place 0.5g nightly for two weeks then twice a week after) 30 g 11 03/29/2022   famotidine (PEPCID) 20 MG tablet Take 1 tablet (20 mg total) by mouth 2 (two) times daily. 60 tablet 3 04/12/2022   furosemide (LASIX) 40 MG tablet TAKE 2 TABLETS (80 MG TOTAL) IN MORNING AND 1 AND 1/2 TABLETS (60 MG TOTAL) IN EVENING. CHANGE IN DOSAGE. (Patient taking differently: Take 40-80 mg by mouth See admin instructions. Takes 80 mg in the morning and 40 mg at night.) 315 tablet 0 04/12/2022   glipiZIDE (  GLUCOTROL) 5 MG tablet Take 1 tablet (5 mg total) by mouth daily. 30 tablet 0 unk last dose   hydrALAZINE (APRESOLINE) 100 MG tablet TAKE 1 TABLET THREE TIMES DAILY (Patient taking differently: Take 100 mg by mouth 3 (three) times daily.) 270 tablet 3 04/12/2022   insulin aspart (NOVOLOG) 100 UNIT/ML injection Take 2 units for blood sugar 200-250, 4 units for blood sugar 252-300, 6 units for blood sugar 301-350, 8 units for blood sugar greater than 350 and call MD. (Patient taking differently: Inject 2-8 Units into the skin 3 (three) times daily with meals. Take 2 units for blood sugar 200-250, 4 units for blood sugar 252-300, 6 units for blood sugar  301-350, 8 units for blood sugar greater than 350 and call MD.) 10 mL 11 04/12/2022   lactose free nutrition (BOOST) LIQD Take 237 mLs by mouth daily. (To supplement meals)    04/12/2022   lidocaine (XYLOCAINE) 2 % solution Use as directed 15 mLs in the mouth or throat as needed for mouth pain. (Patient taking differently: Use as directed 15 mLs in the mouth or throat daily as needed for mouth pain.) 100 mL 2 04/08/2022   magnesium oxide (MAG-OX) 400 (241.3 Mg) MG tablet Take 1 tablet (400 mg total) by mouth daily.   04/11/2022   metoprolol succinate (TOPROL-XL) 25 MG 24 hr tablet TAKE 1/2 TABLET EVERY DAY (Patient taking differently: Take 12.5 mg by mouth daily.) 45 tablet 3 04/12/2022 at 9 am   mycophenolate (CELLCEPT) 500 MG tablet Take 2 tablets (1,000 mg total) by mouth daily. (Patient taking differently: Take 500 mg by mouth 2 (two) times daily.) 60 tablet 1 04/12/2022   ondansetron (ZOFRAN-ODT) 4 MG disintegrating tablet Take 4 mg by mouth every 8 (eight) hours as needed for vomiting or nausea.   04/12/2022   pantoprazole (PROTONIX) 40 MG tablet Take 1 tablet (40 mg total) by mouth daily. 30 tablet 1 04/11/2022   predniSONE (DELTASONE) 10 MG tablet Take 20 mg by mouth daily with breakfast.   04/12/2022   spironolactone (ALDACTONE) 25 MG tablet TAKE 1 TABLET EVERY DAY (Patient taking differently: Take 25 mg by mouth daily.) 90 tablet 3 04/12/2022   tadalafil, PAH, (ADCIRCA) 20 MG tablet Take 2 tablets (40 mg total) by mouth daily. (Patient taking differently: Take 40 mg by mouth at bedtime.) 60 tablet 11 04/11/2022   TRESIBA FLEXTOUCH 100 UNIT/ML FlexTouch Pen Inject 14 Units into the skin at bedtime.   04/11/2022   vitamin C (ASCORBIC ACID) 500 MG tablet Take 500 mg by mouth daily with lunch.   04/11/2022   warfarin (COUMADIN) 5 MG tablet Take 1 to 1 &1/2 tablets by mouth daily as directed by the coumadin clinic (Patient taking differently: Take 5-7.5 mg by mouth See admin instructions. 5 mg on  Monday,Thursday,Saturday 7.5 mg on Wednesday, Friday and Sunday) 120 tablet 0 04/11/2022 at 5.30 pm   Blood Glucose Monitoring Suppl (TRUE METRIX METER) w/Device KIT       glucose blood (TRUE METRIX BLOOD GLUCOSE TEST) test strip Use 1-2 times daily      Insulin Syringe-Needle U-100 (B-D INS SYR ULTRAFINE .3CC/29G) 29G X 1/2" 0.3 ML MISC Use for sliding scale for breakthrough high blood sugar readings 100 each 1    TRUE METRIX BLOOD GLUCOSE TEST test strip SMARTSIG:Via Meter      vitamin C (ASCORBIC ACID) 500 MG tablet       Scheduled:   ALPRAZolam  0.25 mg Oral QHS  amLODipine  10 mg Oral Daily   amoxicillin-clavulanate  1 tablet Oral BID   atorvastatin  80 mg Oral q1800   Chlorhexidine Gluconate Cloth  6 each Topical Daily   cloNIDine  0.2 mg Oral BID   famotidine  20 mg Oral Daily   furosemide  40 mg Oral QHS   furosemide  80 mg Oral Daily   hydrALAZINE  100 mg Oral TID   insulin aspart  0-15 Units Subcutaneous TID WC   insulin aspart  0-5 Units Subcutaneous QHS   insulin aspart  3 Units Subcutaneous TID WC   insulin glargine-yfgn  14 Units Subcutaneous QHS   magnesium oxide  400 mg Oral Q breakfast   metoprolol succinate  12.5 mg Oral Daily   mycophenolate  500 mg Oral BID   pantoprazole  40 mg Oral Daily   predniSONE  20 mg Oral Q breakfast   spironolactone  25 mg Oral Daily   tadalafil (PAH)  40 mg Oral QHS   Warfarin - Pharmacist Dosing Inpatient   Does not apply q1600   PRN: acetaminophen **OR** acetaminophen, fentaNYL (SUBLIMAZE) injection, ondansetron (ZOFRAN) IV, mouth rinse, oxyCODONE-acetaminophen, simethicone  Assessment: 48 yoF with PMH PAF & mechanical AVR on warfarin, CAD s/p CABG 2005, PAH, DM, HTN, dCHF, vasculitis, autoimmune hemolytic anemia, admitted 7/5 for diverticulitis flare. Pharmacy consulted to continue warfarin while admitted.   Baseline INR slightly subtherapeutic Prior anticoagulation: warfarin 5 mg daily except 7.5 mg Wed, Fri, Sun; LD  7/4  Significant events: Received PRBC on 7/3 for Hgb 6.5.  Today, 04/16/2022: CBC: Hgb low d/t hemolytic anemia but improved after PRBC x 2 overnight; Plt slightly low INR 2.9, therapeutic, yesterday's warfarin dose 2.5 mg Major drug interactions: none; broad spectrum abx (Zosyn) can increase warfarin sensitivity No bleeding issues per nursing Regular Diet  Goal of Therapy: INR 2-3 (confirmed with clinic records)  Plan: Warfarin 2.5 mg PO today x 1 Daily INR Monitor for signs of bleeding or thrombosis    Thank you for allowing pharmacy to be a part of this patient's care.  Royetta Asal, PharmD, BCPS Clinical Pharmacist [place] Please utilize Amion for appropriate phone number to reach the unit pharmacist ([place]) 04/16/2022 2:06 PM

## 2022-04-16 NOTE — Discharge Summary (Signed)
Physician Discharge Summary   Patient: [name] MRN: [id number] DOB: Oct 28, 1940  Admit date:     04/12/2022  Discharge date: 04/16/22  Discharge Physician: Edwin Dada   PCP: [name], MD     Recommendations at discharge:  Follow up with Dr. Abner Greenspan Urology on Thu Jul 13 for foley catheter removal Follow up with Dr. Chryl Heck Hematology this week for anemia and repeat labs     Discharge Diagnoses: Principal Problem:   Emphysematous cystitis Active Problems:   Autoimmune hemolytic anemia ([place])   Colovesical fistula, ruled out   Hypertension   Hypercholesteremia   Diabetes mellitus without complication (HCC)   Thrombocytopenia (HCC)   GERD (gastroesophageal reflux disease)   Chronic diastolic CHF (congestive heart failure) (HCC)   Moderate to severe pulmonary hypertension (HCC)   PAF (paroxysmal atrial fibrillation) (HCC)   Vasculitis, ANCA positive   CKD (chronic kidney disease), stage IV ([place])   Subtherapeutic international normalized ratio (INR)   Female bladder prolapse   Hypokalemia      Hospital Course: [name] is an 81 y.o. 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  In the ER, CT showed emphysematous cystitis, possible diverticulitis.  Foley placed, started on antibiotics and admitted.     * Emphysematous cystitis Urology consulted, antibiotics started and foley placed.  Pelvic bladder prolapse and mycophenolate both predispose to infection.   Sepsis ruled out.  Diverticulitis ruled out.  Patient now mentating at baseline, taking orals.  Temp < 100 F, heart rate < 100bpm.  Urine culture obtained after antibiotics with no growth.  Discharged on Augmentin for 5 more days, foley in place, close follow up with Urology on Thu Jul 13 for  foley removal.       Autoimmune hemolytic anemia (HCC) Anemia, likely chronic blood loss as well Follows with hematology.  Multifactorial.  Hemoglobin 6.3 on admission -> transfused 1 unit -> 8.1 -> 7.1 -> transfused 2nd unit -> 9.3 -> 8.4 on day of discharge.  No clinically evident bleeding here, although she did have FOBT+, probably unsurprising in an 81 y.o. on warfarin for a mechanical aortic valve.    Hematology consulted, they recommended continuing current regimen, close follow up and transfusion for Hgb < 8 g/dL.     Colovesical fistula, ruled out CT imaging from May suggested the presence of a colovesical fistula.  The patient underwent CT with barium enema and general surgery consultation in the hospital and they felt imaging ruled out fistula, thankfully.              The [facility] Controlled Substances Registry was reviewed for this patient prior to discharge.   Consultants: Hematology, Urology, General Surgery Procedures performed:  CT abdomen and pelvis with IV contrast Foley catheter insertion CT abdomen and pelvis with barium enema    Disposition: Home health   DISCHARGE MEDICATION: Allergies as of 04/16/2022       Reactions   Flagyl [metronidazole] Rash   ALL-OVER BODY RASH   Ciprofloxacin    Other reaction(s): Stomach upset - tolerates Other reaction(s): Stomach upset - tolerates   Coreg [carvedilol] Other (See Comments)   Terrible cramping in the feet and had a lot of bowel movements, but not diarrhea   Diflucan [fluconazole] Diarrhea   Losartan Swelling   Patient  doesn't recall site of swelling   Privigen [immune Globulin (human)] Other (See Comments)   Severe/excruciating pain   Sulfamethoxazole-trimethoprim    Other reaction(s): stomach upset .   Verapamil Hives   Zetia [ezetimibe] Other (See Comments)   Reaction not recalled   Zocor [simvastatin - High Dose] Other (See Comments)   Reaction not recalled   Gatifloxacin Rash    Redness to skin around eye        Medication List     TAKE these medications    acetaminophen 325 MG tablet Commonly known as: TYLENOL Take 650 mg by mouth every 6 (six) hours as needed for mild pain.   alendronate 70 MG tablet Commonly known as: FOSAMAX Take 70 mg by mouth every Saturday.   allopurinol 100 MG tablet Commonly known as: ZYLOPRIM Take 100 mg by mouth daily.   ALPRAZolam 0.5 MG tablet Commonly known as: XANAX Take 0.25-0.5 mg by mouth at bedtime.   amLODipine 10 MG tablet Commonly known as: NORVASC TAKE 1 TABLET EVERY DAY   amoxicillin-clavulanate 500-125 MG tablet Commonly known as: AUGMENTIN Take 1 tablet (500 mg total) by mouth 2 (two) times daily.   atorvastatin 80 MG tablet Commonly known as: LIPITOR Take 1 tablet (80 mg total) by mouth daily at 6 PM.   B-D INS SYR ULTRAFINE .3CC/29G 29G X 1/2" 0.3 ML Misc Generic drug: Insulin Syringe-Needle U-100 Use for sliding scale for breakthrough high blood sugar readings   cloNIDine 0.2 MG tablet Commonly known as: CATAPRES TAKE 1 TABLET TWICE DAILY   estradiol 0.1 MG/GM vaginal cream Commonly known as: ESTRACE Place 0.5g nightly for two weeks then twice a week after What changed:  how much to take how to take this when to take this   famotidine 20 MG tablet Commonly known as: PEPCID Take 1 tablet (20 mg total) by mouth 2 (two) times daily.   furosemide 40 MG tablet Commonly known as: LASIX TAKE 2 TABLETS (80 MG TOTAL) IN MORNING AND 1 AND 1/2 TABLETS (60 MG TOTAL) IN EVENING. CHANGE IN DOSAGE. What changed: See the new instructions.   glipiZIDE 5 MG tablet Commonly known as: Glucotrol Take 1 tablet (5 mg total) by mouth daily.   hydrALAZINE 100 MG tablet Commonly known as: APRESOLINE TAKE 1 TABLET THREE TIMES DAILY   insulin aspart 100 UNIT/ML injection Commonly known as: novoLOG Take 2 units for blood sugar 200-250, 4 units for blood sugar 252-300, 6 units for blood sugar 301-350, 8  units for blood sugar greater than 350 and call MD. What changed:  how much to take how to take this when to take this   lactose free nutrition Liqd Take 237 mLs by mouth daily. (To supplement meals)   lidocaine 2 % solution Commonly known as: XYLOCAINE Use as directed 15 mLs in the mouth or throat as needed for mouth pain. What changed: when to take this   magnesium oxide 400 (241.3 Mg) MG tablet Commonly known as: MAG-OX Take 1 tablet (400 mg total) by mouth daily.   metoprolol succinate 25 MG 24 hr tablet Commonly known as: TOPROL-XL TAKE 1/2 TABLET EVERY DAY   mycophenolate 500 MG tablet Commonly known as: CellCept Take 2 tablets (1,000 mg total) by mouth daily. What changed:  how much to take when to take this   ondansetron 4 MG disintegrating tablet Commonly known as: ZOFRAN-ODT Take 4 mg by mouth every 8 (eight) hours as needed for vomiting or nausea.   pantoprazole 40 MG  tablet Commonly known as: Protonix Take 1 tablet (40 mg total) by mouth daily.   predniSONE 10 MG tablet Commonly known as: DELTASONE Take 20 mg by mouth daily with breakfast.   simethicone 80 MG chewable tablet Commonly known as: Gas-X Chew 1 tablet (80 mg total) by mouth 4 (four) times daily as needed for flatulence.   spironolactone 25 MG tablet Commonly known as: ALDACTONE TAKE 1 TABLET EVERY DAY   tadalafil (PAH) 20 MG tablet Commonly known as: ADCIRCA Take 2 tablets (40 mg total) by mouth daily. What changed: when to take this   [name] FlexTouch 100 UNIT/ML FlexTouch Pen Generic drug: insulin degludec Inject 14 Units into the skin at bedtime.   True Metrix Blood Glucose Test test strip Generic drug: glucose blood Use 1-2 times daily   True Metrix Blood Glucose Test test strip Generic drug: glucose blood SMARTSIG:Via Meter   True Metrix Meter w/Device Kit   Vitamin B12 1000 MCG Tbcr Take 1,000 mcg by mouth daily with lunch.   vitamin C 500 MG tablet Commonly known  as: ASCORBIC ACID Take 500 mg by mouth daily with lunch.   vitamin C 500 MG tablet Commonly known as: ASCORBIC ACID   Vitamin D3 50 MCG (2000 UT) capsule Take 2,000 Units by mouth daily with lunch.   warfarin 5 MG tablet Commonly known as: COUMADIN Take as directed. If you are unsure how to take this medication, talk to your nurse or doctor. Original instructions: Take 1 to 1 &1/2 tablets by mouth daily as directed by the coumadin clinic What changed:  how much to take how to take this when to take this additional instructions        Follow-up Information     Janith Lima, MD Follow up.   Specialty: Urology Contact information: [place] [address] [phone]         Benay Pike, MD .   Specialty: Hematology and Oncology Contact information: [place] [address] [phone]                 Discharge Instructions     Discharge instructions   Complete by: As directed    From Dr. Loleta Books: You were admitted for a bladder infection.  This was a special kind of bladder infection called "emphysematous cystitis" which means, we believe the wall of hte bladder was particularly inflamed. For that reason, you had a foley catheter placed in the bladder, and should keep this catheter tube in until you see Dr. Abner Greenspan, the Urologist (bladder surgeon) Call Dr. Virgina Norfolk office (contact information below in the to do section) and let them know you need to follow-up this week in the ED saw him in the hospital  To complete antibiotic treatment, take Augmentin/amoxicillin clavulanate 500-125 twice daily for five more days (starting tonight)  Resume all your other home medicines including insulins  If you have gas, you can take the medicine simethicone, which is the same component in Gas-X, which you had in the hospital  Call Dr. Rob Hickman office tomorrow for a follow-up appointment   Increase activity slowly   Complete by: As directed         Discharge Exam: Filed Weights   04/13/22 0638 04/15/22 0444 04/16/22 0622  Weight: 63.3 kg 61.2 kg 59.6 kg    General: Pt is alert, awake, not in acute distress Cardiovascular: RRR, nl S1-S2, no murmurs appreciated.   No LE edema.   Respiratory: Normal respiratory  rate and rhythm.  CTAB without rales or wheezes. Abdominal: Abdomen soft and non-tender.  No distension or HSM.   Neuro/Psych: Strength symmetric in upper and lower extremities.  Judgment and insight appear normal.   Condition at discharge: good  The results of significant diagnostics from this hospitalization (including imaging, microbiology, ancillary and laboratory) are listed below for reference.   Imaging Studies: CT PELVIS WO CONTRAST  Result Date: 04/14/2022 CLINICAL DATA:  Clinical concern for colovesical fistula. EXAM: CT PELVIS WITHOUT CONTRAST TECHNIQUE: Multidetector CT imaging of the pelvis was performed following the standard protocol without intravenous contrast. RADIATION DOSE REDUCTION: This exam was performed according to the departmental dose-optimization program which includes automated exposure control, adjustment of the mA and/or kV according to patient size and/or use of iterative reconstruction technique. COMPARISON:  Abdomen pelvis CT 04/12/2022 FINDINGS: Urinary Tract: Foley catheter decompresses the urinary bladder. Gas in the bladder lumen is compatible with the presence of the instrumentation. Bowel: Advanced diverticular disease is seen in the sigmoid colon without diverticulitis. Rectum and sigmoid colon are well opacified with rectal contrast. No evidence for contrast in the vaginal lumen or bladder lumen to suggest colovaginal or colovesical fistula, respectively. Vascular/Lymphatic: No pelvic sidewall lymphadenopathy. Reproductive: The uterus is surgically absent. There is no adnexal mass. Other:  Trace free fluid noted right adnexal space. Musculoskeletal: No worrisome lytic or sclerotic  osseous abnormality. Degenerative changes noted in both hips. IMPRESSION: 1. No evidence for colovaginal or colovesical fistula. 2. Sigmoid diverticulosis without diverticulitis. Electronically Signed   By: Misty Stanley M.D.   On: 04/14/2022 16:30   CT ABDOMEN PELVIS WO CONTRAST  Result Date: 04/12/2022 CLINICAL DATA:  A female at age 79 presents with acute abdominal pain. EXAM: CT ABDOMEN AND PELVIS WITHOUT CONTRAST TECHNIQUE: Multidetector CT imaging of the abdomen and pelvis was performed following the standard protocol without IV contrast. RADIATION DOSE REDUCTION: This exam was performed according to the departmental dose-optimization program which includes automated exposure control, adjustment of the mA and/or kV according to patient size and/or use of iterative reconstruction technique. COMPARISON:  Feb 20, 2022. FINDINGS: Lower chest: Calcified coronary artery disease. Signs of prior granulomatous disease. Hepatobiliary: Liver with smooth contours. Post cholecystectomy. No gross biliary duct distension. Pancreas: No signs of pancreatic inflammation.  Pancreatic atrophy. Spleen: Granulomatous changes in the spleen. Adrenals/Urinary Tract: Renal vascular calcifications. No perinephric stranding, no hydronephrosis or obstructing renal calculus. Normal adrenal glands. Gas throughout the urinary bladder at the periphery. Gas within the urinary bladder and about the urinary bladder is slightly increased from previous imaging. Density of material in the urinary bladder otherwise with simple fluid density. Towards the base of the urinary bladder is difficult to exclude the possibility of emphysematous cystitis based on the relationship of small gas locules of the wall of the urinary bladder. There is no substantial perivesical stranding. Stomach/Bowel: Small hiatal hernia. Small duodenal diverticulum. No acute small bowel process. The appendix is not visible though there are no secondary signs to suggest acute  appendicitis. Colon is largely collapsed. There are signs of acute diverticulitis in the LEFT lower quadrant. Posterior diverticulum on image 48/2 with some mild surrounding stranding. Diverticulosis and diverticular disease throughout the sigmoid is moderate to marked and otherwise unchanged. Vascular/Lymphatic: Calcified aortic atherosclerosis. No aneurysmal dilation of the abdominal aorta. Signs of prior stenting of the LEFT common iliac artery. No adenopathy in either the abdomen or in the pelvis. Reproductive: Post hysterectomy without adnexal mass. Other: No free air or ascites. Musculoskeletal:  Osteopenia. No acute bone finding or destructive bone process. Spinal degenerative changes. Redemonstration of compression fracture at L4 with similar appearance. IMPRESSION: 1. Emphysematous cystitis but without substantial surrounding stranding or bladder wall thickening. Consider correlation with urinalysis and urologic and or ID consultation for further management as this can be life-threatening. Close follow-up is suggested. 2. Acute diverticulitis of the sigmoid colon. 3. Gas in the urinary bladder is increased since previous imaging. 4. Aortic atherosclerosis and coronary artery disease. 5. Similar appearance of L4 compression fracture. Aortic Atherosclerosis (ICD10-I70.0) and Emphysema (ICD10-J43.9). These results were called by telephone at the time of interpretation on 04/12/2022 at 1:14 pm to provider Davonna Belling , who verbally acknowledged these results. Electronically Signed   By: Zetta Bills M.D.   On: 04/12/2022 13:19    Microbiology: Results for orders placed or performed during the hospital encounter of 04/12/22  Urine Culture     Status: None   Collection Time: 04/14/22  1:29 PM   Specimen: Urine, Catheterized  Result Value Ref Range Status   Specimen Description   Final    URINE, CATHETERIZED Performed at [facility] [address], [place], [place] [address]     Special Requests   Final    NONE Performed at [facility], [place] [address], [place], [place] [address]    Culture   Final    NO GROWTH Performed at [facility], [place] [address], [name],  [address]    Report Status 04/15/2022 FINAL  Final   *Note: Due to a large number of results and/or encounters for the requested time period, some results have not been displayed. A complete set of results can be found in Results Review.    Labs: CBC: Recent Labs  Lab 04/10/22 1310 04/12/22 1149 04/12/22 1915 04/13/22 0534 04/14/22 0425 04/15/22 0922 04/16/22 0343  WBC 7.9 8.6  --  6.4 4.5 5.6 4.1  NEUTROABS 7.4 7.4  --   --   --   --   --   HGB 6.5* 6.3* 6.8* 8.1* 7.1* 9.3* 8.4*  HCT 19.4* 19.5* 21.2* 24.3* 21.3* 27.4* 25.5*  MCV 93.3 95.1  --  90.7 91.8 90.1 93.4  PLT 125* 141*  --  112* 113* 115* 563*   Basic Metabolic Panel: Recent Labs  Lab 04/12/22 1140 04/13/22 0534 04/14/22 0425 04/15/22 0922 04/16/22 0343  NA 135 137 141 138 138  K 4.7 4.2 3.8 3.4* 4.2  CL 106 105 108 103 107  CO2 19* 21* '23 22 22  ' GLUCOSE 212* 127* 76 95 250*  BUN 154* 142* 128* 110* 100*  CREATININE 2.62* 2.51* 2.62* 2.66* 2.65*  CALCIUM 9.0 8.5* 8.4* 8.1* 7.9*   Liver Function Tests: Recent Labs  Lab 04/10/22 1310 04/12/22 1140 04/13/22 0534  AST '28 21 21  ' ALT '24 18 18  ' ALKPHOS 54 42 36*  BILITOT 0.5 0.9 1.1  PROT 5.3* 5.2* 4.9*  ALBUMIN 3.3* 3.0* 2.7*   CBG: Recent Labs  Lab 04/15/22 0739 04/15/22 1223 04/15/22 1617 04/15/22 2115 04/16/22 0814  GLUCAP 141* 169* 216* 340* 237*    Discharge time spent: approximately 35 minutes spent on discharge counseling, evaluation of patient on day of discharge, and coordination of discharge planning with nursing, social work, pharmacy and case management  Signed: Edwin Dada, MD [facility] 04/16/2022

## 2022-04-17 ENCOUNTER — Other Ambulatory Visit: Payer: Self-pay

## 2022-04-17 ENCOUNTER — Inpatient Hospital Stay: Payer: Medicare HMO

## 2022-04-17 ENCOUNTER — Emergency Department (HOSPITAL_COMMUNITY)
Admission: EM | Admit: 2022-04-17 | Discharge: 2022-04-17 | Disposition: A | Discharge status: Home / Self Care | Payer: Medicare HMO | Source: Home / Self Care | Attending: Emergency Medicine | Admitting: Emergency Medicine

## 2022-04-17 ENCOUNTER — Ambulatory Visit (INDEPENDENT_AMBULATORY_CARE_PROVIDER_SITE_OTHER): Payer: Medicare HMO

## 2022-04-17 ENCOUNTER — Encounter (HOSPITAL_COMMUNITY): Payer: Self-pay

## 2022-04-17 DIAGNOSIS — R109 Unspecified abdominal pain: Secondary | ICD-10-CM | POA: Diagnosis present

## 2022-04-17 DIAGNOSIS — E8809 Other disorders of plasma-protein metabolism, not elsewhere classified: Secondary | ICD-10-CM | POA: Diagnosis not present

## 2022-04-17 DIAGNOSIS — N3080 Other cystitis without hematuria: Secondary | ICD-10-CM | POA: Diagnosis present

## 2022-04-17 DIAGNOSIS — D72829 Elevated white blood cell count, unspecified: Secondary | ICD-10-CM | POA: Insufficient documentation

## 2022-04-17 DIAGNOSIS — R1111 Vomiting without nausea: Secondary | ICD-10-CM | POA: Diagnosis not present

## 2022-04-17 DIAGNOSIS — R233 Spontaneous ecchymoses: Secondary | ICD-10-CM | POA: Diagnosis not present

## 2022-04-17 DIAGNOSIS — A0472 Enterocolitis due to Clostridium difficile, not specified as recurrent: Secondary | ICD-10-CM | POA: Diagnosis present

## 2022-04-17 DIAGNOSIS — K5792 Diverticulitis of intestine, part unspecified, without perforation or abscess without bleeding: Secondary | ICD-10-CM | POA: Diagnosis not present

## 2022-04-17 DIAGNOSIS — E1165 Type 2 diabetes mellitus with hyperglycemia: Secondary | ICD-10-CM | POA: Diagnosis not present

## 2022-04-17 DIAGNOSIS — I959 Hypotension, unspecified: Secondary | ICD-10-CM | POA: Diagnosis not present

## 2022-04-17 DIAGNOSIS — I7782 Antineutrophilic cytoplasmic antibody (ANCA) vasculitis: Secondary | ICD-10-CM | POA: Diagnosis not present

## 2022-04-17 DIAGNOSIS — R7989 Other specified abnormal findings of blood chemistry: Secondary | ICD-10-CM | POA: Insufficient documentation

## 2022-04-17 DIAGNOSIS — E7800 Pure hypercholesterolemia, unspecified: Secondary | ICD-10-CM | POA: Diagnosis present

## 2022-04-17 DIAGNOSIS — R7309 Other abnormal glucose: Secondary | ICD-10-CM | POA: Insufficient documentation

## 2022-04-17 DIAGNOSIS — Y831 Surgical operation with implant of artificial internal device as the cause of abnormal reaction of the patient, or of later complication, without mention of misadventure at the time of the procedure: Secondary | ICD-10-CM | POA: Diagnosis present

## 2022-04-17 DIAGNOSIS — D591 Autoimmune hemolytic anemia, unspecified: Secondary | ICD-10-CM | POA: Diagnosis not present

## 2022-04-17 DIAGNOSIS — I10 Essential (primary) hypertension: Secondary | ICD-10-CM | POA: Diagnosis not present

## 2022-04-17 DIAGNOSIS — D649 Anemia, unspecified: Secondary | ICD-10-CM | POA: Insufficient documentation

## 2022-04-17 DIAGNOSIS — R112 Nausea with vomiting, unspecified: Secondary | ICD-10-CM | POA: Diagnosis not present

## 2022-04-17 DIAGNOSIS — M7989 Other specified soft tissue disorders: Secondary | ICD-10-CM | POA: Diagnosis not present

## 2022-04-17 DIAGNOSIS — R1110 Vomiting, unspecified: Secondary | ICD-10-CM | POA: Diagnosis not present

## 2022-04-17 DIAGNOSIS — I480 Paroxysmal atrial fibrillation: Secondary | ICD-10-CM | POA: Diagnosis present

## 2022-04-17 DIAGNOSIS — Z978 Presence of other specified devices: Secondary | ICD-10-CM

## 2022-04-17 DIAGNOSIS — Z6826 Body mass index (BMI) 26.0-26.9, adult: Secondary | ICD-10-CM | POA: Diagnosis not present

## 2022-04-17 DIAGNOSIS — Z7901 Long term (current) use of anticoagulants: Secondary | ICD-10-CM

## 2022-04-17 DIAGNOSIS — I2510 Atherosclerotic heart disease of native coronary artery without angina pectoris: Secondary | ICD-10-CM | POA: Diagnosis present

## 2022-04-17 DIAGNOSIS — I2721 Secondary pulmonary arterial hypertension: Secondary | ICD-10-CM | POA: Diagnosis present

## 2022-04-17 DIAGNOSIS — I119 Hypertensive heart disease without heart failure: Secondary | ICD-10-CM | POA: Diagnosis not present

## 2022-04-17 DIAGNOSIS — R110 Nausea: Secondary | ICD-10-CM | POA: Diagnosis not present

## 2022-04-17 DIAGNOSIS — K5732 Diverticulitis of large intestine without perforation or abscess without bleeding: Secondary | ICD-10-CM | POA: Diagnosis present

## 2022-04-17 DIAGNOSIS — R197 Diarrhea, unspecified: Secondary | ICD-10-CM | POA: Diagnosis not present

## 2022-04-17 DIAGNOSIS — I5032 Chronic diastolic (congestive) heart failure: Secondary | ICD-10-CM | POA: Diagnosis not present

## 2022-04-17 DIAGNOSIS — I4891 Unspecified atrial fibrillation: Secondary | ICD-10-CM | POA: Diagnosis not present

## 2022-04-17 DIAGNOSIS — R102 Pelvic and perineal pain: Secondary | ICD-10-CM

## 2022-04-17 DIAGNOSIS — I130 Hypertensive heart and chronic kidney disease with heart failure and stage 1 through stage 4 chronic kidney disease, or unspecified chronic kidney disease: Secondary | ICD-10-CM | POA: Diagnosis present

## 2022-04-17 DIAGNOSIS — X58XXXA Exposure to other specified factors, initial encounter: Secondary | ICD-10-CM | POA: Diagnosis not present

## 2022-04-17 DIAGNOSIS — E119 Type 2 diabetes mellitus without complications: Secondary | ICD-10-CM | POA: Diagnosis not present

## 2022-04-17 DIAGNOSIS — R791 Abnormal coagulation profile: Secondary | ICD-10-CM | POA: Diagnosis not present

## 2022-04-17 DIAGNOSIS — E871 Hypo-osmolality and hyponatremia: Secondary | ICD-10-CM | POA: Diagnosis not present

## 2022-04-17 DIAGNOSIS — I700 Atherosclerosis of aorta: Secondary | ICD-10-CM | POA: Diagnosis not present

## 2022-04-17 DIAGNOSIS — T7840XA Allergy, unspecified, initial encounter: Secondary | ICD-10-CM | POA: Diagnosis not present

## 2022-04-17 DIAGNOSIS — E440 Moderate protein-calorie malnutrition: Secondary | ICD-10-CM | POA: Diagnosis present

## 2022-04-17 DIAGNOSIS — E1122 Type 2 diabetes mellitus with diabetic chronic kidney disease: Secondary | ICD-10-CM | POA: Diagnosis present

## 2022-04-17 DIAGNOSIS — D696 Thrombocytopenia, unspecified: Secondary | ICD-10-CM | POA: Diagnosis present

## 2022-04-17 DIAGNOSIS — R1084 Generalized abdominal pain: Secondary | ICD-10-CM | POA: Diagnosis not present

## 2022-04-17 DIAGNOSIS — Z952 Presence of prosthetic heart valve: Secondary | ICD-10-CM | POA: Diagnosis not present

## 2022-04-17 DIAGNOSIS — N184 Chronic kidney disease, stage 4 (severe): Secondary | ICD-10-CM | POA: Diagnosis not present

## 2022-04-17 DIAGNOSIS — R339 Retention of urine, unspecified: Secondary | ICD-10-CM | POA: Diagnosis present

## 2022-04-17 DIAGNOSIS — Z66 Do not resuscitate: Secondary | ICD-10-CM | POA: Diagnosis not present

## 2022-04-17 DIAGNOSIS — K529 Noninfective gastroenteritis and colitis, unspecified: Secondary | ICD-10-CM | POA: Diagnosis not present

## 2022-04-17 DIAGNOSIS — K219 Gastro-esophageal reflux disease without esophagitis: Secondary | ICD-10-CM | POA: Diagnosis present

## 2022-04-17 DIAGNOSIS — I776 Arteritis, unspecified: Secondary | ICD-10-CM | POA: Diagnosis present

## 2022-04-17 DIAGNOSIS — R509 Fever, unspecified: Secondary | ICD-10-CM | POA: Diagnosis not present

## 2022-04-17 LAB — CBG MONITORING, ED: Glucose-Capillary: 122 mg/dL — ABNORMAL HIGH (ref 70–99)

## 2022-04-17 LAB — CBC WITH DIFFERENTIAL/PLATELET
Abs Immature Granulocytes: 0.05 10*3/uL (ref 0.00–0.07)
Basophils Absolute: 0 10*3/uL (ref 0.0–0.1)
Basophils Relative: 0 %
Eosinophils Absolute: 0 10*3/uL (ref 0.0–0.5)
Eosinophils Relative: 0 %
HCT: 28.8 % — ABNORMAL LOW (ref 36.0–46.0)
Hemoglobin: 9.8 g/dL — ABNORMAL LOW (ref 12.0–15.0)
Immature Granulocytes: 1 %
Lymphocytes Relative: 2 %
Lymphs Abs: 0.2 10*3/uL — ABNORMAL LOW (ref 0.7–4.0)
MCH: 30.9 pg (ref 26.0–34.0)
MCHC: 34 g/dL (ref 30.0–36.0)
MCV: 90.9 fL (ref 80.0–100.0)
Monocytes Absolute: 0.5 10*3/uL (ref 0.1–1.0)
Monocytes Relative: 4 %
Neutro Abs: 9.9 10*3/uL — ABNORMAL HIGH (ref 1.7–7.7)
Neutrophils Relative %: 93 %
Platelets: 122 10*3/uL — ABNORMAL LOW (ref 150–400)
RBC: 3.17 MIL/uL — ABNORMAL LOW (ref 3.87–5.11)
RDW: 17.4 % — ABNORMAL HIGH (ref 11.5–15.5)
WBC: 10.7 10*3/uL — ABNORMAL HIGH (ref 4.0–10.5)
nRBC: 0 % (ref 0.0–0.2)

## 2022-04-17 LAB — COMPREHENSIVE METABOLIC PANEL
ALT: 22 U/L (ref 0–44)
AST: 28 U/L (ref 15–41)
Albumin: 2.9 g/dL — ABNORMAL LOW (ref 3.5–5.0)
Alkaline Phosphatase: 47 U/L (ref 38–126)
Anion gap: 10 (ref 5–15)
BUN: 82 mg/dL — ABNORMAL HIGH (ref 8–23)
CO2: 20 mmol/L — ABNORMAL LOW (ref 22–32)
Calcium: 7.6 mg/dL — ABNORMAL LOW (ref 8.9–10.3)
Chloride: 104 mmol/L (ref 98–111)
Creatinine, Ser: 2.4 mg/dL — ABNORMAL HIGH (ref 0.44–1.00)
GFR, Estimated: 20 mL/min — ABNORMAL LOW (ref >60)
Glucose, Bld: 119 mg/dL — ABNORMAL HIGH (ref 70–99)
Potassium: 3.7 mmol/L (ref 3.5–5.1)
Sodium: 134 mmol/L — ABNORMAL LOW (ref 135–145)
Total Bilirubin: 0.7 mg/dL (ref 0.3–1.2)
Total Protein: 5.2 g/dL — ABNORMAL LOW (ref 6.5–8.1)

## 2022-04-17 MED ORDER — MIRABEGRON ER 25 MG PO TB24: 25.0000 mg | ORAL_TABLET | Freq: Every day | ORAL | 0 refills | Status: DC

## 2022-04-17 MED ORDER — MIRABEGRON ER 25 MG PO TB24
25.0000 mg | ORAL_TABLET | Freq: Every day | ORAL | Status: DC
  Administered 2022-04-17: 25 mg via ORAL
  Filled 2022-04-17: qty 1

## 2022-04-17 NOTE — ED Provider Notes (Signed)
[facility] DEPT Provider Note   CSN: [id number] Arrival date & time: 04/17/22  1136     History {Add pertinent medical, surgical, social history, OB history to HPI:1} Chief Complaint  Patient presents with   Abdominal Pain    [name] is a 81 y.o. female.  HPI Patient presenting for persistent bladder pain and discomfort from Foley catheter that was placed recently.  She was discharged from the hospital yesterday.  Since going home, her daughter is worried that the patient cannot maintain herself there.  She has a friend who sleeps with her at nighttime but she is alone during the day and seems to have trouble fixing her own meals at this time.  She has not previously had a Foley catheter.  She is taking her usual medicines as prescribed.  She is apparently eating fairly well. Recent hospitalization for emphysematous cystitis-additional problems included autoimmune hemolytic anemia requiring transfusion, possible colovesical fistula, ruled out with CT imaging    Home Medications Prior to Admission medications   Medication Sig Start Date End Date Taking? Authorizing Provider  acetaminophen (TYLENOL) 325 MG tablet Take 650 mg by mouth every 6 (six) hours as needed for mild pain.     [provider]  alendronate (FOSAMAX) 70 MG tablet Take 70 mg by mouth every Saturday. 12/06/21   [provider]  allopurinol (ZYLOPRIM) 100 MG tablet Take 100 mg by mouth daily.    [provider]  ALPRAZolam Duanne Moron) 0.5 MG tablet Take 0.25-0.5 mg by mouth at bedtime. 07/27/21   [provider]  amLODipine (NORVASC) 10 MG tablet TAKE 1 TABLET EVERY DAY Patient taking differently: Take 10 mg by mouth daily. 05/13/21   [name], MD  amoxicillin-clavulanate (AUGMENTIN) 500-125 MG tablet Take 1 tablet (500 mg total) by mouth 2 (two) times daily. 04/16/22   Danford, Suann Larry, MD  atorvastatin (LIPITOR) 80 MG tablet Take 1 tablet  (80 mg total) by mouth daily at 6 PM. 11/22/18   [name], MD  Blood Glucose Monitoring Suppl (TRUE METRIX METER) w/Device KIT  03/18/21   [provider]  Cholecalciferol (VITAMIN D3) 50 MCG (2000 UT) capsule Take 2,000 Units by mouth daily with lunch. 11/26/18   [provider]  cloNIDine (CATAPRES) 0.2 MG tablet TAKE 1 TABLET TWICE DAILY Patient taking differently: Take 0.2 mg by mouth 2 (two) times daily. 06/22/21   [name], MD  Cyanocobalamin (VITAMIN B12) 1000 MCG TBCR Take 1,000 mcg by mouth daily with lunch.    [provider]  estradiol (ESTRACE) 0.1 MG/GM vaginal cream Place 0.5g nightly for two weeks then twice a week after Patient taking differently: Place 1 Applicatorful vaginally 3 (three) times a week. Place 0.5g nightly for two weeks then twice a week after 09/19/21   Jaquita Folds, MD  famotidine (PEPCID) 20 MG tablet Take 1 tablet (20 mg total) by mouth 2 (two) times daily. 04/10/22   Benay Pike, MD  furosemide (LASIX) 40 MG tablet TAKE 2 TABLETS (80 MG TOTAL) IN MORNING AND 1 AND 1/2 TABLETS (60 MG TOTAL) IN EVENING. CHANGE IN DOSAGE. Patient taking differently: Take 40-80 mg by mouth See admin instructions. Takes 80 mg in the morning and 40 mg at night. 03/22/22   Clegg, Amy D, NP  glipiZIDE (GLUCOTROL) 5 MG tablet Take 1 tablet (5 mg total) by mouth daily. 01/09/22   Benay Pike, MD  glucose blood (TRUE METRIX BLOOD GLUCOSE TEST) test strip Use  1-2 times daily    [provider]  hydrALAZINE (APRESOLINE) 100 MG tablet TAKE 1 TABLET THREE TIMES DAILY Patient taking differently: Take 100 mg by mouth 3 (three) times daily. 02/17/22   [name], MD  insulin aspart (NOVOLOG) 100 UNIT/ML injection Take 2 units for blood sugar 200-250, 4 units for blood sugar 252-300, 6 units for blood sugar 301-350, 8 units for blood sugar greater than 350 and call MD. Patient taking differently: Inject 2-8 Units into the skin 3 (three)  times daily with meals. Take 2 units for blood sugar 200-250, 4 units for blood sugar 252-300, 6 units for blood sugar 301-350, 8 units for blood sugar greater than 350 and call MD. 02/23/22   Gardenia Phlegm, NP  Insulin Syringe-Needle U-100 (B-D INS SYR ULTRAFINE .3CC/29G) 29G X 1/2" 0.3 ML MISC Use for sliding scale for breakthrough high blood sugar readings 02/24/22   Benay Pike, MD  lactose free nutrition (BOOST) LIQD Take 237 mLs by mouth daily. (To supplement meals)     [provider]  lidocaine (XYLOCAINE) 2 % solution Use as directed 15 mLs in the mouth or throat as needed for mouth pain. Patient taking differently: Use as directed 15 mLs in the mouth or throat daily as needed for mouth pain. 01/30/22   Benay Pike, MD  magnesium oxide (MAG-OX) 400 (241.3 Mg) MG tablet Take 1 tablet (400 mg total) by mouth daily. 01/30/18   [name], MD  metoprolol succinate (TOPROL-XL) 25 MG 24 hr tablet TAKE 1/2 TABLET EVERY DAY Patient taking differently: Take 12.5 mg by mouth daily. 07/19/21   [name], MD  mycophenolate (CELLCEPT) 500 MG tablet Take 2 tablets (1,000 mg total) by mouth daily. Patient taking differently: Take 500 mg by mouth 2 (two) times daily. 03/13/22   Causey, Charlestine Massed, NP  ondansetron (ZOFRAN-ODT) 4 MG disintegrating tablet Take 4 mg by mouth every 8 (eight) hours as needed for vomiting or nausea. 01/12/22   [provider]  pantoprazole (PROTONIX) 40 MG tablet Take 1 tablet (40 mg total) by mouth daily. 04/07/22   Benay Pike, MD  predniSONE (DELTASONE) 10 MG tablet Take 20 mg by mouth daily with breakfast.    [provider]  simethicone (GAS-X) 80 MG chewable tablet Chew 1 tablet (80 mg total) by mouth 4 (four) times daily as needed for flatulence. 04/16/22 04/16/23  DanfordSuann Larry, MD  spironolactone (ALDACTONE) 25 MG tablet TAKE 1 TABLET EVERY DAY Patient taking differently: Take 25 mg by mouth daily. 05/13/21    [name], MD  tadalafil, PAH, (ADCIRCA) 20 MG tablet Take 2 tablets (40 mg total) by mouth daily. Patient taking differently: Take 40 mg by mouth at bedtime. 12/06/21   [name], MD  TRESIBA FLEXTOUCH 100 UNIT/ML FlexTouch Pen Inject 14 Units into the skin at bedtime. 02/20/22   [provider]  TRUE METRIX BLOOD GLUCOSE TEST test strip SMARTSIG:Via Meter 04/08/21   [provider]  vitamin C (ASCORBIC ACID) 500 MG tablet Take 500 mg by mouth daily with lunch.    [provider]  vitamin C (ASCORBIC ACID) 500 MG tablet     [provider]  warfarin (COUMADIN) 5 MG tablet Take 1 to 1 &1/2 tablets by mouth daily as directed by the coumadin clinic Patient taking differently: Take 5-7.5 mg by mouth See admin instructions. 5 mg on Monday,Thursday,Saturday 7.5 mg on Wednesday, Friday and Sunday 12/06/21   Quay Burow  J, MD      Allergies    Flagyl [metronidazole], Ciprofloxacin, Coreg [carvedilol], Diflucan [fluconazole], Losartan, Privigen [immune globulin (human)], Sulfamethoxazole-trimethoprim, Verapamil, Zetia [ezetimibe], Zocor [simvastatin - high dose], and Gatifloxacin    Review of Systems   Review of Systems  Physical Exam Updated Vital Signs BP (!) 182/62 (BP Location: Left Arm)   Pulse 84   Temp 98 F (36.7 C) (Oral)   Resp (!) 24   SpO2 98%  Physical Exam Vitals and nursing note reviewed.  Constitutional:      Appearance: She is well-developed and normal weight. She is not ill-appearing.     Comments: Elderly, frail  HENT:     Head: Normocephalic and atraumatic.     Right Ear: External ear normal.     Left Ear: External ear normal.  Eyes:     Conjunctiva/sclera: Conjunctivae normal.     Pupils: Pupils are equal, round, and reactive to light.  Neck:     Trachea: Phonation normal.  Cardiovascular:     Rate and Rhythm: Normal rate and regular rhythm.     Heart sounds: Normal heart sounds.  Pulmonary:     Effort:  Pulmonary effort is normal. No respiratory distress.     Breath sounds: Normal breath sounds. No stridor.  Abdominal:     General: There is no distension.     Palpations: Abdomen is soft. There is no mass.     Tenderness: There is no abdominal tenderness.     Hernia: No hernia is present.  Musculoskeletal:        General: Normal range of motion.     Cervical back: Normal range of motion and neck supple.  Skin:    General: Skin is warm and dry.  Neurological:     Mental Status: She is alert and oriented to person, place, and time.     Cranial Nerves: No cranial nerve deficit.     Sensory: No sensory deficit.     Motor: No abnormal muscle tone.     Coordination: Coordination normal.  Psychiatric:        Mood and Affect: Mood normal.        Behavior: Behavior normal.        Thought Content: Thought content normal.        Judgment: Judgment normal.     ED Results / Procedures / Treatments   Labs (all labs ordered are listed, but only abnormal results are displayed) Labs Reviewed - No data to display  EKG None  Radiology No results found.  Procedures Procedures  {Document cardiac monitor, telemetry assessment procedure when appropriate:1}  Medications Ordered in ED Medications - No data to display  ED Course/ Medical Decision Making/ A&P                           Medical Decision Making  ***  {Document critical care time when appropriate:1} {Document review of labs and clinical decision tools ie heart score, Chads2Vasc2 etc:1}  {Document your independent review of radiology images, and any outside records:1} {Document your discussion with family members, caretakers, and with consultants:1} {Document social determinants of health affecting pt's care:1} {Document your decision making why or why not admission, treatments were needed:1} Final Clinical Impression(s) / ED Diagnoses Final diagnoses:  None    Rx / DC Orders ED Discharge Orders     None

## 2022-04-17 NOTE — Patient Instructions (Signed)
Description   Called pt, no answer. Called and spoke with pt's son. Advised to continue 1 tablet daily except 1.5 tablets on Sundays, Wednesdays, and Fridays. [facility] up in Epic, done weekly.  Call with Prednisone dosage changes ('20mg'$  daily starting 7/5). Please keep green leafy veggies and boost in your diet. Call with any new meds [phone] & if you have any procedures where you need to hold warfarin, have them fax a clearance form or labs to [phone].

## 2022-04-17 NOTE — ED Triage Notes (Addendum)
Pt BIB EMS from home. Pt was recently admitted for bladder infection and discharged yesterday. Pt called her daughter and said she was having continued bladder pain. Pt also endorses pain from catheter. Denies N/V/D and fever. A&O x4.   24G RH Fentanyl 100 mcg

## 2022-04-17 NOTE — Discharge Instructions (Signed)
Start the prescription for bladder spasm, tomorrow morning.  Use Tylenol, 650 mg every 4 hours also, if needed for pain.  Try to eat and drink regularly.  Use your walker when you get up.  Follow-up with the urologist, this week as scheduled to get a checkup and have the bladder catheter removed.  The blood testing today is reassuring.  Your parameters show improvement.  Continue taking all of your medicine as prescribed.

## 2022-04-17 NOTE — Patient Outreach (Signed)
[facility]) Care Management  04/17/2022  [name] [birth year] [id number]   Telephone call to patient for hospital follow up. No answer.  Unable to leave a message.  Plan: RN CM will attempt again within 4 business days.    [name], RN, MSN [facility] Care Management Care Management Coordinator Direct Line [phone] Toll Free: [phone]  Fax: [phone]

## 2022-04-17 NOTE — ED Notes (Signed)
Pt wasn't able to ambulate passed foot of the bed pt stated that her stomach was hurting to bad, also gave pt water and crackers for po challenge

## 2022-04-18 ENCOUNTER — Other Ambulatory Visit: Payer: Self-pay

## 2022-04-18 ENCOUNTER — Encounter (HOSPITAL_COMMUNITY): Payer: Self-pay

## 2022-04-18 ENCOUNTER — Emergency Department (HOSPITAL_COMMUNITY): Payer: Medicare HMO

## 2022-04-18 ENCOUNTER — Inpatient Hospital Stay (HOSPITAL_COMMUNITY)
Admission: EM | Admit: 2022-04-18 | Discharge: 2022-05-02 | DRG: 372 | Disposition: A | Discharge status: Home Health | Payer: Medicare HMO | Attending: Internal Medicine | Admitting: Internal Medicine

## 2022-04-18 DIAGNOSIS — E440 Moderate protein-calorie malnutrition: Secondary | ICD-10-CM | POA: Diagnosis present

## 2022-04-18 DIAGNOSIS — N3080 Other cystitis without hematuria: Secondary | ICD-10-CM | POA: Diagnosis present

## 2022-04-18 DIAGNOSIS — X58XXXA Exposure to other specified factors, initial encounter: Secondary | ICD-10-CM | POA: Diagnosis not present

## 2022-04-18 DIAGNOSIS — I130 Hypertensive heart and chronic kidney disease with heart failure and stage 1 through stage 4 chronic kidney disease, or unspecified chronic kidney disease: Secondary | ICD-10-CM | POA: Diagnosis present

## 2022-04-18 DIAGNOSIS — K529 Noninfective gastroenteritis and colitis, unspecified: Secondary | ICD-10-CM | POA: Diagnosis not present

## 2022-04-18 DIAGNOSIS — Z79899 Other long term (current) drug therapy: Secondary | ICD-10-CM

## 2022-04-18 DIAGNOSIS — K5792 Diverticulitis of intestine, part unspecified, without perforation or abscess without bleeding: Principal | ICD-10-CM

## 2022-04-18 DIAGNOSIS — I776 Arteritis, unspecified: Secondary | ICD-10-CM | POA: Diagnosis present

## 2022-04-18 DIAGNOSIS — Z6826 Body mass index (BMI) 26.0-26.9, adult: Secondary | ICD-10-CM

## 2022-04-18 DIAGNOSIS — E7800 Pure hypercholesterolemia, unspecified: Secondary | ICD-10-CM | POA: Diagnosis present

## 2022-04-18 DIAGNOSIS — E119 Type 2 diabetes mellitus without complications: Secondary | ICD-10-CM

## 2022-04-18 DIAGNOSIS — Z66 Do not resuscitate: Secondary | ICD-10-CM | POA: Diagnosis not present

## 2022-04-18 DIAGNOSIS — R112 Nausea with vomiting, unspecified: Secondary | ICD-10-CM

## 2022-04-18 DIAGNOSIS — Z960 Presence of urogenital implants: Secondary | ICD-10-CM | POA: Diagnosis present

## 2022-04-18 DIAGNOSIS — D591 Autoimmune hemolytic anemia, unspecified: Secondary | ICD-10-CM | POA: Diagnosis present

## 2022-04-18 DIAGNOSIS — Z87891 Personal history of nicotine dependence: Secondary | ICD-10-CM

## 2022-04-18 DIAGNOSIS — R233 Spontaneous ecchymoses: Secondary | ICD-10-CM | POA: Diagnosis not present

## 2022-04-18 DIAGNOSIS — N184 Chronic kidney disease, stage 4 (severe): Secondary | ICD-10-CM | POA: Diagnosis present

## 2022-04-18 DIAGNOSIS — K5732 Diverticulitis of large intestine without perforation or abscess without bleeding: Secondary | ICD-10-CM | POA: Diagnosis present

## 2022-04-18 DIAGNOSIS — E1122 Type 2 diabetes mellitus with diabetic chronic kidney disease: Secondary | ICD-10-CM | POA: Diagnosis present

## 2022-04-18 DIAGNOSIS — I7782 Antineutrophilic cytoplasmic antibody (ANCA) vasculitis: Secondary | ICD-10-CM | POA: Diagnosis present

## 2022-04-18 DIAGNOSIS — Z9221 Personal history of antineoplastic chemotherapy: Secondary | ICD-10-CM

## 2022-04-18 DIAGNOSIS — I2510 Atherosclerotic heart disease of native coronary artery without angina pectoris: Secondary | ICD-10-CM | POA: Diagnosis present

## 2022-04-18 DIAGNOSIS — I10 Essential (primary) hypertension: Secondary | ICD-10-CM | POA: Diagnosis present

## 2022-04-18 DIAGNOSIS — D696 Thrombocytopenia, unspecified: Secondary | ICD-10-CM | POA: Diagnosis present

## 2022-04-18 DIAGNOSIS — I2721 Secondary pulmonary arterial hypertension: Secondary | ICD-10-CM | POA: Diagnosis present

## 2022-04-18 DIAGNOSIS — Z90710 Acquired absence of both cervix and uterus: Secondary | ICD-10-CM

## 2022-04-18 DIAGNOSIS — Z888 Allergy status to other drugs, medicaments and biological substances status: Secondary | ICD-10-CM

## 2022-04-18 DIAGNOSIS — Z7901 Long term (current) use of anticoagulants: Secondary | ICD-10-CM | POA: Diagnosis not present

## 2022-04-18 DIAGNOSIS — Y831 Surgical operation with implant of artificial internal device as the cause of abnormal reaction of the patient, or of later complication, without mention of misadventure at the time of the procedure: Secondary | ICD-10-CM | POA: Diagnosis present

## 2022-04-18 DIAGNOSIS — Z7983 Long term (current) use of bisphosphonates: Secondary | ICD-10-CM

## 2022-04-18 DIAGNOSIS — Z951 Presence of aortocoronary bypass graft: Secondary | ICD-10-CM

## 2022-04-18 DIAGNOSIS — Z8349 Family history of other endocrine, nutritional and metabolic diseases: Secondary | ICD-10-CM

## 2022-04-18 DIAGNOSIS — Z952 Presence of prosthetic heart valve: Secondary | ICD-10-CM

## 2022-04-18 DIAGNOSIS — E1165 Type 2 diabetes mellitus with hyperglycemia: Secondary | ICD-10-CM | POA: Diagnosis not present

## 2022-04-18 DIAGNOSIS — R791 Abnormal coagulation profile: Secondary | ICD-10-CM | POA: Diagnosis not present

## 2022-04-18 DIAGNOSIS — I5032 Chronic diastolic (congestive) heart failure: Secondary | ICD-10-CM | POA: Diagnosis present

## 2022-04-18 DIAGNOSIS — K219 Gastro-esophageal reflux disease without esophagitis: Secondary | ICD-10-CM | POA: Diagnosis present

## 2022-04-18 DIAGNOSIS — R339 Retention of urine, unspecified: Secondary | ICD-10-CM | POA: Diagnosis present

## 2022-04-18 DIAGNOSIS — T82855D Stenosis of coronary artery stent, subsequent encounter: Secondary | ICD-10-CM

## 2022-04-18 DIAGNOSIS — E8809 Other disorders of plasma-protein metabolism, not elsewhere classified: Secondary | ICD-10-CM | POA: Diagnosis not present

## 2022-04-18 DIAGNOSIS — N3289 Other specified disorders of bladder: Secondary | ICD-10-CM | POA: Diagnosis present

## 2022-04-18 DIAGNOSIS — R109 Unspecified abdominal pain: Secondary | ICD-10-CM | POA: Diagnosis present

## 2022-04-18 DIAGNOSIS — Z794 Long term (current) use of insulin: Secondary | ICD-10-CM

## 2022-04-18 DIAGNOSIS — R6510 Systemic inflammatory response syndrome (SIRS) of non-infectious origin without acute organ dysfunction: Secondary | ICD-10-CM

## 2022-04-18 DIAGNOSIS — E871 Hypo-osmolality and hyponatremia: Secondary | ICD-10-CM | POA: Diagnosis not present

## 2022-04-18 DIAGNOSIS — S9031XA Contusion of right foot, initial encounter: Secondary | ICD-10-CM | POA: Diagnosis not present

## 2022-04-18 DIAGNOSIS — Z8249 Family history of ischemic heart disease and other diseases of the circulatory system: Secondary | ICD-10-CM

## 2022-04-18 DIAGNOSIS — A0472 Enterocolitis due to Clostridium difficile, not specified as recurrent: Secondary | ICD-10-CM | POA: Diagnosis present

## 2022-04-18 DIAGNOSIS — Z9049 Acquired absence of other specified parts of digestive tract: Secondary | ICD-10-CM

## 2022-04-18 DIAGNOSIS — I480 Paroxysmal atrial fibrillation: Secondary | ICD-10-CM | POA: Diagnosis present

## 2022-04-18 DIAGNOSIS — Z881 Allergy status to other antibiotic agents status: Secondary | ICD-10-CM

## 2022-04-18 LAB — URINALYSIS, ROUTINE W REFLEX MICROSCOPIC
Bilirubin Urine: NEGATIVE
Glucose, UA: NEGATIVE mg/dL
Ketones, ur: NEGATIVE mg/dL
Nitrite: NEGATIVE
Protein, ur: 100 mg/dL — AB
Specific Gravity, Urine: 1.014 (ref 1.005–1.030)
pH: 5 (ref 5.0–8.0)

## 2022-04-18 LAB — COMPREHENSIVE METABOLIC PANEL
ALT: 22 U/L (ref 0–44)
AST: 31 U/L (ref 15–41)
Albumin: 2.9 g/dL — ABNORMAL LOW (ref 3.5–5.0)
Alkaline Phosphatase: 53 U/L (ref 38–126)
Anion gap: 9 (ref 5–15)
BUN: 80 mg/dL — ABNORMAL HIGH (ref 8–23)
CO2: 20 mmol/L — ABNORMAL LOW (ref 22–32)
Calcium: 7.9 mg/dL — ABNORMAL LOW (ref 8.9–10.3)
Chloride: 108 mmol/L (ref 98–111)
Creatinine, Ser: 2.56 mg/dL — ABNORMAL HIGH (ref 0.44–1.00)
GFR, Estimated: 18 mL/min — ABNORMAL LOW (ref >60)
Glucose, Bld: 96 mg/dL (ref 70–99)
Potassium: 3.8 mmol/L (ref 3.5–5.1)
Sodium: 137 mmol/L (ref 135–145)
Total Bilirubin: 0.8 mg/dL (ref 0.3–1.2)
Total Protein: 5.3 g/dL — ABNORMAL LOW (ref 6.5–8.1)

## 2022-04-18 LAB — CBC WITH DIFFERENTIAL/PLATELET
Abs Immature Granulocytes: 0.06 10*3/uL (ref 0.00–0.07)
Basophils Absolute: 0 10*3/uL (ref 0.0–0.1)
Basophils Relative: 0 %
Eosinophils Absolute: 0 10*3/uL (ref 0.0–0.5)
Eosinophils Relative: 0 %
HCT: 30 % — ABNORMAL LOW (ref 36.0–46.0)
Hemoglobin: 10 g/dL — ABNORMAL LOW (ref 12.0–15.0)
Immature Granulocytes: 1 %
Lymphocytes Relative: 5 %
Lymphs Abs: 0.6 10*3/uL — ABNORMAL LOW (ref 0.7–4.0)
MCH: 30.7 pg (ref 26.0–34.0)
MCHC: 33.3 g/dL (ref 30.0–36.0)
MCV: 92 fL (ref 80.0–100.0)
Monocytes Absolute: 0.5 10*3/uL (ref 0.1–1.0)
Monocytes Relative: 4 %
Neutro Abs: 10.5 10*3/uL — ABNORMAL HIGH (ref 1.7–7.7)
Neutrophils Relative %: 90 %
Platelets: 130 10*3/uL — ABNORMAL LOW (ref 150–400)
RBC: 3.26 MIL/uL — ABNORMAL LOW (ref 3.87–5.11)
RDW: 17.4 % — ABNORMAL HIGH (ref 11.5–15.5)
WBC: 11.7 10*3/uL — ABNORMAL HIGH (ref 4.0–10.5)
nRBC: 0 % (ref 0.0–0.2)

## 2022-04-18 LAB — PROTIME-INR
INR: 3.8 — ABNORMAL HIGH (ref 0.8–1.2)
Prothrombin Time: 37.5 seconds — ABNORMAL HIGH (ref 11.4–15.2)

## 2022-04-18 LAB — LACTIC ACID, PLASMA
Lactic Acid, Venous: 1 mmol/L (ref 0.5–1.9)
Lactic Acid, Venous: 1 mmol/L (ref 0.5–1.9)

## 2022-04-18 LAB — APTT: aPTT: 69 seconds — ABNORMAL HIGH (ref 24–36)

## 2022-04-18 MED ORDER — PANTOPRAZOLE SODIUM 40 MG PO TBEC
40.0000 mg | DELAYED_RELEASE_TABLET | Freq: Every day | ORAL | Status: DC
  Administered 2022-04-18 – 2022-05-02 (×15): 40 mg via ORAL
  Filled 2022-04-18 (×15): qty 1

## 2022-04-18 MED ORDER — FOLIC ACID 1 MG PO TABS
1.0000 mg | ORAL_TABLET | Freq: Every day | ORAL | Status: DC
  Administered 2022-04-18 – 2022-05-02 (×15): 1 mg via ORAL
  Filled 2022-04-18 (×15): qty 1

## 2022-04-18 MED ORDER — PIPERACILLIN-TAZOBACTAM 3.375 G IVPB 30 MIN
3.3750 g | Freq: Once | INTRAVENOUS | Status: AC
Start: 2022-04-18 — End: 2022-04-18
  Administered 2022-04-18: 3.375 g via INTRAVENOUS
  Filled 2022-04-18: qty 50

## 2022-04-18 MED ORDER — LACTATED RINGERS IV BOLUS (SEPSIS)
1000.0000 mL | Freq: Once | INTRAVENOUS | Status: AC
Start: 2022-04-18 — End: 2022-04-18
  Administered 2022-04-18: 1000 mL via INTRAVENOUS

## 2022-04-18 MED ORDER — FUROSEMIDE 40 MG PO TABS
40.0000 mg | ORAL_TABLET | Freq: Every evening | ORAL | Status: DC
Start: 2022-04-18 — End: 2022-05-02
  Administered 2022-04-18 – 2022-05-01 (×14): 40 mg via ORAL
  Filled 2022-04-18 (×14): qty 1

## 2022-04-18 MED ORDER — ALLOPURINOL 100 MG PO TABS
100.0000 mg | ORAL_TABLET | Freq: Every day | ORAL | Status: DC
  Administered 2022-04-18 – 2022-05-02 (×15): 100 mg via ORAL
  Filled 2022-04-18 (×15): qty 1

## 2022-04-18 MED ORDER — PIPERACILLIN-TAZOBACTAM 3.375 G IVPB 30 MIN: 3.3750 g | Freq: Once | INTRAVENOUS | Status: DC

## 2022-04-18 MED ORDER — FUROSEMIDE 40 MG PO TABS: 40.0000 mg | ORAL_TABLET | ORAL | Status: DC

## 2022-04-18 MED ORDER — CLONIDINE HCL 0.1 MG PO TABS
0.2000 mg | ORAL_TABLET | Freq: Two times a day (BID) | ORAL | Status: DC
  Administered 2022-04-18 – 2022-05-02 (×28): 0.2 mg via ORAL
  Filled 2022-04-18 (×28): qty 2

## 2022-04-18 MED ORDER — PREDNISONE 20 MG PO TABS
20.0000 mg | ORAL_TABLET | Freq: Every day | ORAL | Status: DC
  Administered 2022-04-19 – 2022-05-02 (×14): 20 mg via ORAL
  Filled 2022-04-18 (×14): qty 1

## 2022-04-18 MED ORDER — HYDRALAZINE HCL 50 MG PO TABS
100.0000 mg | ORAL_TABLET | Freq: Three times a day (TID) | ORAL | Status: DC
  Administered 2022-04-18 – 2022-05-02 (×41): 100 mg via ORAL
  Filled 2022-04-18 (×42): qty 2

## 2022-04-18 MED ORDER — PIPERACILLIN-TAZOBACTAM 3.375 G IVPB
3.3750 g | Freq: Two times a day (BID) | INTRAVENOUS | Status: DC
  Administered 2022-04-18: 3.375 g via INTRAVENOUS
  Filled 2022-04-18 (×2): qty 50

## 2022-04-18 MED ORDER — FUROSEMIDE 40 MG PO TABS
80.0000 mg | ORAL_TABLET | Freq: Every day | ORAL | Status: DC
  Administered 2022-04-19 – 2022-05-02 (×14): 80 mg via ORAL
  Filled 2022-04-18 (×14): qty 2

## 2022-04-18 MED ORDER — TADALAFIL 20 MG PO TABS
40.0000 mg | ORAL_TABLET | Freq: Every evening | ORAL | Status: DC
  Administered 2022-04-18 – 2022-05-01 (×14): 40 mg via ORAL
  Filled 2022-04-18 (×15): qty 2

## 2022-04-18 MED ORDER — ALPRAZOLAM 0.25 MG PO TABS
0.2500 mg | ORAL_TABLET | Freq: Every day | ORAL | Status: DC
  Administered 2022-04-18 – 2022-05-01 (×13): 0.25 mg via ORAL
  Filled 2022-04-18 (×14): qty 1

## 2022-04-18 MED ORDER — ASPIRIN 81 MG PO TBEC
81.0000 mg | DELAYED_RELEASE_TABLET | Freq: Every day | ORAL | Status: DC
  Administered 2022-04-18 – 2022-05-02 (×15): 81 mg via ORAL
  Filled 2022-04-18 (×15): qty 1

## 2022-04-18 MED ORDER — ACETAMINOPHEN 325 MG PO TABS
650.0000 mg | ORAL_TABLET | Freq: Four times a day (QID) | ORAL | Status: DC | PRN
  Administered 2022-04-18 – 2022-04-30 (×15): 650 mg via ORAL
  Filled 2022-04-18 (×14): qty 2

## 2022-04-18 MED ORDER — OXYCODONE HCL 5 MG PO TABS
5.0000 mg | ORAL_TABLET | Freq: Four times a day (QID) | ORAL | Status: DC | PRN
  Administered 2022-04-28 (×2): 5 mg via ORAL
  Filled 2022-04-18 (×3): qty 1

## 2022-04-18 MED ORDER — WARFARIN - PHARMACIST DOSING INPATIENT: Freq: Every day | Status: DC

## 2022-04-18 MED ORDER — AMLODIPINE BESYLATE 10 MG PO TABS
10.0000 mg | ORAL_TABLET | Freq: Every day | ORAL | Status: DC
  Administered 2022-04-18 – 2022-05-02 (×15): 10 mg via ORAL
  Filled 2022-04-18 (×15): qty 1

## 2022-04-18 MED ORDER — ONDANSETRON HCL 4 MG/2ML IJ SOLN
4.0000 mg | Freq: Four times a day (QID) | INTRAMUSCULAR | Status: DC | PRN
  Administered 2022-04-20 – 2022-04-26 (×3): 4 mg via INTRAVENOUS
  Filled 2022-04-18 (×4): qty 2

## 2022-04-18 MED ORDER — SPIRONOLACTONE 25 MG PO TABS
25.0000 mg | ORAL_TABLET | Freq: Every day | ORAL | Status: DC
  Administered 2022-04-19 – 2022-05-02 (×14): 25 mg via ORAL
  Filled 2022-04-18 (×14): qty 1

## 2022-04-18 MED ORDER — ONDANSETRON HCL 4 MG PO TABS: 4.0000 mg | ORAL_TABLET | Freq: Four times a day (QID) | ORAL | Status: DC | PRN

## 2022-04-18 MED ORDER — ACETAMINOPHEN 650 MG RE SUPP: 650.0000 mg | Freq: Four times a day (QID) | RECTAL | Status: DC | PRN

## 2022-04-18 MED ORDER — ATORVASTATIN CALCIUM 40 MG PO TABS
80.0000 mg | ORAL_TABLET | Freq: Every day | ORAL | Status: DC
Start: 2022-04-19 — End: 2022-05-02
  Administered 2022-04-19 – 2022-05-01 (×13): 80 mg via ORAL
  Filled 2022-04-18 (×13): qty 2

## 2022-04-18 MED ORDER — MORPHINE SULFATE (PF) 2 MG/ML IV SOLN: 2.0000 mg | INTRAVENOUS | Status: DC | PRN

## 2022-04-18 MED ORDER — MYCOPHENOLATE MOFETIL 250 MG PO CAPS
500.0000 mg | ORAL_CAPSULE | Freq: Two times a day (BID) | ORAL | Status: DC
Start: 2022-04-18 — End: 2022-04-21
  Administered 2022-04-18 – 2022-04-20 (×5): 500 mg via ORAL
  Filled 2022-04-18 (×6): qty 2

## 2022-04-18 MED ORDER — METOPROLOL SUCCINATE ER 25 MG PO TB24
12.5000 mg | ORAL_TABLET | Freq: Every day | ORAL | Status: DC
  Administered 2022-04-19 – 2022-05-02 (×12): 12.5 mg via ORAL
  Filled 2022-04-18 (×14): qty 1

## 2022-04-18 MED ORDER — ACETAMINOPHEN 325 MG PO TABS
650.0000 mg | ORAL_TABLET | Freq: Once | ORAL | Status: AC
  Administered 2022-04-18: 650 mg via ORAL
  Filled 2022-04-18: qty 2

## 2022-04-18 MED ORDER — MIRABEGRON ER 25 MG PO TB24
25.0000 mg | ORAL_TABLET | Freq: Every day | ORAL | Status: DC
  Administered 2022-04-18 – 2022-05-02 (×15): 25 mg via ORAL
  Filled 2022-04-18 (×15): qty 1

## 2022-04-18 NOTE — ED Provider Notes (Signed)
[facility] DEPT Provider Note   CSN: [id number] Arrival date & time: 04/18/22  1151     History  Chief Complaint  Patient presents with   Fever   Abdominal Pain    [name] is a 81 y.o. female.  Patient was recently admitted to the hospital for emphysematous bladder, was on IV antibiotics and Foley catheter.  She was discharged 7/9 on Augmentin.  Continues with Foley.  She said since discharge the antibiotics have been making her sick and she has had nausea vomiting and diarrhea along with diffuse abdominal pain.  She has had chills. She was seen yesterday for continued abdominal pain and bladder pain.  Labs demonstrated a mildly elevated white count low bicarb, stable CKD.  Discharged home.  Symptoms continued today.  She did not know she had a fever.  She has not seen any blood in her vomit or diarrhea.  Does not endorse any urinary symptoms.  No chest pain or shortness of breath.  The history is provided by the patient and the EMS personnel.  Fever Max temp prior to arrival:  101.2 Temp source:  Rectal Progression:  Unchanged Associated symptoms: chills, diarrhea, nausea and vomiting   Associated symptoms: no chest pain, no congestion, no cough, no dysuria, no myalgias, no rhinorrhea and no somnolence   Risk factors: recent sickness   Abdominal Pain Pain location:  Generalized Pain quality: aching   Pain severity:  Moderate Onset quality:  Gradual Timing:  Constant Progression:  Unchanged Context: not trauma   Relieved by:  Nothing Worsened by:  Nothing Ineffective treatments:  None tried Associated symptoms: chills, diarrhea, fever, nausea and vomiting   Associated symptoms: no chest pain, no cough and no dysuria   Risk factors: being elderly and recent hospitalization        Home Medications Prior to Admission medications   Medication Sig Start Date End Date Taking? Authorizing Provider  acetaminophen (TYLENOL) 325 MG tablet  Take 650 mg by mouth every 6 (six) hours as needed for mild pain.     [provider]  alendronate (FOSAMAX) 70 MG tablet Take 70 mg by mouth every Saturday. 12/06/21   [provider]  allopurinol (ZYLOPRIM) 100 MG tablet Take 100 mg by mouth daily.    [provider]  ALPRAZolam Duanne Moron) 0.5 MG tablet Take 0.25-0.5 mg by mouth at bedtime. 07/27/21   [provider]  amLODipine (NORVASC) 10 MG tablet TAKE 1 TABLET EVERY DAY Patient taking differently: Take 10 mg by mouth daily. 05/13/21   [name], MD  amoxicillin-clavulanate (AUGMENTIN) 500-125 MG tablet Take 1 tablet (500 mg total) by mouth 2 (two) times daily. 04/16/22   Danford, Suann Larry, MD  atorvastatin (LIPITOR) 80 MG tablet Take 1 tablet (80 mg total) by mouth daily at 6 PM. 11/22/18   [name], MD  Blood Glucose Monitoring Suppl (TRUE METRIX METER) w/Device KIT  03/18/21   [provider]  Cholecalciferol (VITAMIN D3) 50 MCG (2000 UT) capsule Take 2,000 Units by mouth daily with lunch. 11/26/18   [provider]  cloNIDine (CATAPRES) 0.2 MG tablet TAKE 1 TABLET TWICE DAILY Patient taking differently: Take 0.2 mg by mouth 2 (two) times daily. 06/22/21   [name], MD  Cyanocobalamin (VITAMIN B12) 1000 MCG TBCR Take 1,000 mcg by mouth daily with lunch.    [provider]  estradiol (ESTRACE) 0.1 MG/GM vaginal cream Place 0.5g nightly for two weeks then twice a week  after Patient taking differently: Place 1 Applicatorful vaginally 3 (three) times a week. Place 0.5g nightly for two weeks then twice a week after 09/19/21   Jaquita Folds, MD  famotidine (PEPCID) 20 MG tablet Take 1 tablet (20 mg total) by mouth 2 (two) times daily. 04/10/22   Benay Pike, MD  furosemide (LASIX) 40 MG tablet TAKE 2 TABLETS (80 MG TOTAL) IN MORNING AND 1 AND 1/2 TABLETS (60 MG TOTAL) IN EVENING. CHANGE IN DOSAGE. Patient taking differently: Take 40-80 mg by mouth See admin  instructions. Takes 80 mg in the morning and 40 mg at night. 03/22/22   Clegg, Amy D, NP  glipiZIDE (GLUCOTROL) 5 MG tablet Take 1 tablet (5 mg total) by mouth daily. 01/09/22   Benay Pike, MD  glucose blood (TRUE METRIX BLOOD GLUCOSE TEST) test strip Use 1-2 times daily    [provider]  hydrALAZINE (APRESOLINE) 100 MG tablet TAKE 1 TABLET THREE TIMES DAILY Patient taking differently: Take 100 mg by mouth 3 (three) times daily. 02/17/22   [name], MD  insulin aspart (NOVOLOG) 100 UNIT/ML injection Take 2 units for blood sugar 200-250, 4 units for blood sugar 252-300, 6 units for blood sugar 301-350, 8 units for blood sugar greater than 350 and call MD. Patient taking differently: Inject 2-8 Units into the skin 3 (three) times daily with meals. Take 2 units for blood sugar 200-250, 4 units for blood sugar 252-300, 6 units for blood sugar 301-350, 8 units for blood sugar greater than 350 and call MD. 02/23/22   Gardenia Phlegm, NP  Insulin Syringe-Needle U-100 (B-D INS SYR ULTRAFINE .3CC/29G) 29G X 1/2" 0.3 ML MISC Use for sliding scale for breakthrough high blood sugar readings 02/24/22   Benay Pike, MD  lactose free nutrition (BOOST) LIQD Take 237 mLs by mouth daily. (To supplement meals)     [provider]  lidocaine (XYLOCAINE) 2 % solution Use as directed 15 mLs in the mouth or throat as needed for mouth pain. Patient taking differently: Use as directed 15 mLs in the mouth or throat daily as needed for mouth pain. 01/30/22   Benay Pike, MD  magnesium oxide (MAG-OX) 400 (241.3 Mg) MG tablet Take 1 tablet (400 mg total) by mouth daily. 01/30/18   [name], MD  metoprolol succinate (TOPROL-XL) 25 MG 24 hr tablet TAKE 1/2 TABLET EVERY DAY Patient taking differently: Take 12.5 mg by mouth daily. 07/19/21   [name], MD  mirabegron ER (MYRBETRIQ) 25 MG TB24 tablet Take 1 tablet (25 mg total) by mouth daily. 04/17/22   Daleen Bo, MD   mycophenolate (CELLCEPT) 500 MG tablet Take 2 tablets (1,000 mg total) by mouth daily. Patient taking differently: Take 500 mg by mouth 2 (two) times daily. 03/13/22   Causey, Charlestine Massed, NP  ondansetron (ZOFRAN-ODT) 4 MG disintegrating tablet Take 4 mg by mouth every 8 (eight) hours as needed for vomiting or nausea. 01/12/22   [provider]  pantoprazole (PROTONIX) 40 MG tablet Take 1 tablet (40 mg total) by mouth daily. 04/07/22   Benay Pike, MD  predniSONE (DELTASONE) 10 MG tablet Take 20 mg by mouth daily with breakfast.    [provider]  simethicone (GAS-X) 80 MG chewable tablet Chew 1 tablet (80 mg total) by mouth 4 (four) times daily as needed for flatulence. 04/16/22 04/16/23  Edwin Dada, MD  spironolactone (ALDACTONE) 25 MG tablet TAKE 1 TABLET EVERY DAY Patient taking differently: Take 25  mg by mouth daily. 05/13/21   [name], MD  tadalafil, PAH, (ADCIRCA) 20 MG tablet Take 2 tablets (40 mg total) by mouth daily. Patient taking differently: Take 40 mg by mouth at bedtime. 12/06/21   [name], MD  TRESIBA FLEXTOUCH 100 UNIT/ML FlexTouch Pen Inject 14 Units into the skin at bedtime. 02/20/22   [provider]  TRUE METRIX BLOOD GLUCOSE TEST test strip SMARTSIG:Via Meter 04/08/21   [provider]  vitamin C (ASCORBIC ACID) 500 MG tablet Take 500 mg by mouth daily with lunch.    [provider]  vitamin C (ASCORBIC ACID) 500 MG tablet     [provider]  warfarin (COUMADIN) 5 MG tablet Take 1 to 1 &1/2 tablets by mouth daily as directed by the coumadin clinic Patient taking differently: Take 5-7.5 mg by mouth See admin instructions. 5 mg on Monday,Thursday,Saturday 7.5 mg on Wednesday, Friday and Sunday 12/06/21   Lorretta Harp, MD      Allergies    Flagyl [metronidazole], Ciprofloxacin, Coreg [carvedilol], Diflucan [fluconazole], Losartan, Privigen [immune globulin (human)],  Sulfamethoxazole-trimethoprim, Verapamil, Zetia [ezetimibe], Zocor [simvastatin - high dose], and Gatifloxacin    Review of Systems   Review of Systems  Constitutional:  Positive for chills and fever.  HENT:  Negative for congestion and rhinorrhea.   Eyes:  Negative for visual disturbance.  Respiratory:  Negative for cough.   Cardiovascular:  Negative for chest pain.  Gastrointestinal:  Positive for abdominal pain, diarrhea, nausea and vomiting.  Genitourinary:  Negative for dysuria.  Musculoskeletal:  Negative for myalgias.    Physical Exam Updated Vital Signs BP (!) 130/56 (BP Location: Right Arm)   Pulse 92   Temp (!) 101.2 F (38.4 C) (Rectal)   Resp 20   Ht '4\' 11"'  (1.499 m)   Wt 59.4 kg   SpO2 99%   BMI 26.46 kg/m  Physical Exam Vitals and nursing note reviewed.  Constitutional:      General: She is not in acute distress.    Appearance: Normal appearance. She is well-developed.  HENT:     Head: Normocephalic and atraumatic.  Eyes:     Conjunctiva/sclera: Conjunctivae normal.  Cardiovascular:     Rate and Rhythm: Normal rate and regular rhythm.     Heart sounds: No murmur heard. Pulmonary:     Effort: Pulmonary effort is normal. No respiratory distress.     Breath sounds: Normal breath sounds.  Abdominal:     Palpations: Abdomen is soft.     Tenderness: There is generalized abdominal tenderness. There is no guarding or rebound.  Genitourinary:    Comments: She has a fully catheter in place with some cloudy urine Musculoskeletal:        General: No deformity. Normal range of motion.     Cervical back: Neck supple.  Skin:    General: Skin is warm and dry.     Capillary Refill: Capillary refill takes less than 2 seconds.  Neurological:     General: No focal deficit present.     Mental Status: She is alert.     ED Results / Procedures / Treatments   Labs (all labs ordered are listed, but only abnormal results are displayed) Labs Reviewed  COMPREHENSIVE  METABOLIC PANEL - Abnormal; Notable for the following components:      Result Value   CO2 20 (*)    BUN 80 (*)    Creatinine, Ser 2.56 (*)    Calcium 7.9 (*)  Total Protein 5.3 (*)    Albumin 2.9 (*)    GFR, Estimated 18 (*)    All other components within normal limits  CBC WITH DIFFERENTIAL/PLATELET - Abnormal; Notable for the following components:   WBC 11.7 (*)    RBC 3.26 (*)    Hemoglobin 10.0 (*)    HCT 30.0 (*)    RDW 17.4 (*)    Platelets 130 (*)    Neutro Abs 10.5 (*)    Lymphs Abs 0.6 (*)    All other components within normal limits  PROTIME-INR - Abnormal; Notable for the following components:   Prothrombin Time 37.5 (*)    INR 3.8 (*)    All other components within normal limits  APTT - Abnormal; Notable for the following components:   aPTT 69 (*)    All other components within normal limits  URINALYSIS, ROUTINE W REFLEX MICROSCOPIC - Abnormal; Notable for the following components:   APPearance HAZY (*)    Hgb urine dipstick SMALL (*)    Protein, ur 100 (*)    Leukocytes,Ua SMALL (*)    Bacteria, UA RARE (*)    All other components within normal limits  CULTURE, BLOOD (ROUTINE X 2)  CULTURE, BLOOD (ROUTINE X 2)  URINE CULTURE  C DIFFICILE QUICK SCREEN W PCR REFLEX    GASTROINTESTINAL PANEL BY PCR, STOOL (REPLACES STOOL CULTURE)  LACTIC ACID, PLASMA  LACTIC ACID, PLASMA  COMPREHENSIVE METABOLIC PANEL  CBC  PROTIME-INR    EKG EKG Interpretation  Date/Time:  Tuesday April 18 2022 12:05:38 EDT Ventricular Rate:  97 PR Interval:    QRS Duration: 92 QT Interval:  355 QTC Calculation: 451 R Axis:   96 Text Interpretation: Atrial fibrillation Right axis deviation No significant change since prior 4/23 Confirmed by Aletta Edouard [phone]) on 04/18/2022 1:00:18 PM  Radiology CT Abdomen Pelvis Wo Contrast  Result Date: 04/18/2022 CLINICAL DATA:  Abdominal pain, vomiting, diarrhea, fever, rectal temp 101.2 on arrival EXAM: CT ABDOMEN AND PELVIS WITHOUT  CONTRAST TECHNIQUE: Multidetector CT imaging of the abdomen and pelvis was performed following the standard protocol without IV contrast. RADIATION DOSE REDUCTION: This exam was performed according to the departmental dose-optimization program which includes automated exposure control, adjustment of the mA and/or kV according to patient size and/or use of iterative reconstruction technique. COMPARISON:  CT pelvis 04/14/2022, CT abdomen and pelvis 04/12/2022 FINDINGS: Lower chest: Peribronchial thickening with minimal bibasilar atelectasis. Large calcified granuloma at base of LEFT lower lobe. Enlargement of cardiac chambers. Hepatobiliary: Gallbladder surgically absent. Liver normal appearance Pancreas: Normal appearance Spleen: Calcified granulomata within spleen, which is otherwise normal. Adrenals/Urinary Tract: Adrenal glands normal appearance. Scattered renal vascular calcifications. No renal mass, hydronephrosis, or hydroureter. No definite urinary tract calcification. Bladder decompressed by Foley catheter. Stomach/Bowel: Appendix not visualized. Diverticulosis of sigmoid colon. Wall thickening and pericolic inflammatory changes at the sigmoid colon over a moderate length, question diverticulitis versus colitis. No extraluminal gas or abscess. Diffuse stranding of pericolic fat extending into sigmoid mesocolon. Stomach and remaining bowel loops unremarkable. Vascular/Lymphatic: Atherosclerotic calcifications aorta, iliac arteries, visceral arteries, coronary arteries. Aorta normal caliber. Significant narrowing of common iliac arteries. No adenopathy. Reproductive: Uterus surgically absent. Ovaries not definitely visualized. Other: Minimal free fluid in pelvis.  No free air.  No hernia. Musculoskeletal: Osseous demineralization. Degenerative changes lumbar spine and RIGHT hip joint. Superior endplate compression fracture L4 unchanged. IMPRESSION: Sigmoid diverticulosis with wall thickening and pericolic  inflammatory changes at the sigmoid colon over a moderate length, question  diverticulitis versus colitis. Extensive atherosclerotic disease changes with significant narrowing of common iliac arteries bilaterally. Superior endplate compression deformity of L4 unchanged. Aortic Atherosclerosis (ICD10-I70.0). Electronically Signed   By: Lavonia Dana M.D.   On: 04/18/2022 14:23   DG Chest Port 1 View  Result Date: 04/18/2022 CLINICAL DATA:  Questionable sepsis, vomiting, diarrhea, fever, abdominal pain, history hypertension EXAM: PORTABLE CHEST 1 VIEW COMPARISON:  Portable exam 1223 hours compared to 02/04/2022 FINDINGS: Enlargement of cardiac silhouette post CABG and AVR. Atherosclerotic calcification aorta. Pulmonary vascularity normal. Mild chronic bronchitic changes with calcified granuloma RIGHT mid lung. Linear scarring LEFT lower lobe. No acute infiltrate, pleural effusion, or pneumothorax. Bones demineralized with BILATERAL glenohumeral degenerative changes and scoliosis. IMPRESSION: Enlargement of cardiac silhouette post CABG and AVR. No acute abnormalities. Aortic Atherosclerosis (ICD10-I70.0). Electronically Signed   By: Lavonia Dana M.D.   On: 04/18/2022 13:01    Procedures .Critical Care  Performed by: Hayden Rasmussen, MD Authorized by: Hayden Rasmussen, MD   Critical care provider statement:    Critical care time (minutes):  45   Critical care time was exclusive of:  Separately billable procedures and treating other patients   Critical care was necessary to treat or prevent imminent or life-threatening deterioration of the following conditions:  Sepsis   Critical care was time spent personally by me on the following activities:  Development of treatment plan with patient or surrogate, discussions with consultants, evaluation of patient's response to treatment, examination of patient, obtaining history from patient or surrogate, ordering and performing treatments and interventions, ordering  and review of laboratory studies, ordering and review of radiographic studies, pulse oximetry and re-evaluation of patient's condition   I assumed direction of critical care for this patient from another provider in my specialty: no       Medications Ordered in ED Medications  piperacillin-tazobactam (ZOSYN) IVPB 3.375 g (has no administration in time range)  oxyCODONE (Oxy IR/ROXICODONE) immediate release tablet 5 mg (has no administration in time range)  atorvastatin (LIPITOR) tablet 80 mg (has no administration in time range)  tadalafil (CIALIS) tablet 40 mg (40 mg Oral Given 04/18/22 1832)  ALPRAZolam (XANAX) tablet 0.25 mg (has no administration in time range)  spironolactone (ALDACTONE) tablet 25 mg (has no administration in time range)  metoprolol succinate (TOPROL-XL) 24 hr tablet 12.5 mg (has no administration in time range)  predniSONE (DELTASONE) tablet 20 mg (has no administration in time range)  pantoprazole (PROTONIX) EC tablet 40 mg (40 mg Oral Given 04/18/22 1832)  acetaminophen (TYLENOL) tablet 650 mg (has no administration in time range)    Or  acetaminophen (TYLENOL) suppository 650 mg (has no administration in time range)  ondansetron (ZOFRAN) tablet 4 mg (has no administration in time range)    Or  ondansetron (ZOFRAN) injection 4 mg (has no administration in time range)  furosemide (LASIX) tablet 80 mg (has no administration in time range)    And  furosemide (LASIX) tablet 40 mg (40 mg Oral Given 04/18/22 1832)  Warfarin - Pharmacist Dosing Inpatient (has no administration in time range)  aspirin EC tablet 81 mg (has no administration in time range)  allopurinol (ZYLOPRIM) tablet 100 mg (has no administration in time range)  amLODipine (NORVASC) tablet 10 mg (has no administration in time range)  cloNIDine (CATAPRES) tablet 0.2 mg (has no administration in time range)  hydrALAZINE (APRESOLINE) tablet 100 mg (has no administration in time range)  mirabegron ER  (MYRBETRIQ) tablet 25 mg (has no administration in  time range)  folic acid (FOLVITE) tablet 1 mg (has no administration in time range)  mycophenolate (CELLCEPT) tablet 500 mg (has no administration in time range)  lactated ringers bolus 1,000 mL (0 mLs Intravenous Stopped 04/18/22 1417)  acetaminophen (TYLENOL) tablet 650 mg (650 mg Oral Given 04/18/22 1310)  piperacillin-tazobactam (ZOSYN) IVPB 3.375 g (0 g Intravenous Stopped 04/18/22 1518)    ED Course/ Medical Decision Making/ A&P Clinical Course as of 04/18/22 1910  Tue Apr 18, 2022  1304 Chest x-ray without any acute infiltrates.  Awaiting radiology reading. [MB]  1456 Discussed with Dr. Marylyn Ishihara [facility] hospitalist who will evaluate patient for admission.  Patient's son is in the room and I updated him on plan for admission for continued treatment of her diverticulitis and possible infectious diarrhea. [MB]    Clinical Course User Index [MB] Hayden Rasmussen, MD                           Medical Decision Making Amount and/or Complexity of Data Reviewed Labs: ordered. Radiology: ordered. ECG/medicine tests: ordered.  Risk OTC drugs. Prescription drug management. Decision regarding hospitalization.  This patient complains of fever abdominal pain nausea vomiting diarrhea; this involves an extensive number of treatment Options and is a complaint that carries with it a high risk of complications and morbidity. The differential includes sepsis, Sirs, intra-abdominal infection including diverticulitis colitis, infectious diarrhea, UTI, pyelonephritis  I ordered, reviewed and interpreted labs, which included CBC elevated white count stable hemoglobin, chemistries with low bicarb elevated BUN and creatinine reflecting some CKD, urinalysis without clear signs of infection, blood culture sent, lactate normal, stool studies ordered I ordered medication IV fluids and nausea medication, IV antibiotics and reviewed PMP when indicated. I ordered  imaging studies which included chest x-ray and CT abdomen and pelvis and I independently    visualized and interpreted imaging which showed continued acute diverticulitis Additional history obtained from patient's son and EMS Previous records obtained and reviewed in epic including recent discharge summary I consulted Dr. Marylyn Ishihara [facility] hospitalist and discussed lab and imaging findings and discussed disposition.  Cardiac monitoring reviewed, atrial fibrillation with controlled ventricular response Social determinants considered, patient socially isolated with poor physical activity Critical Interventions: Initiation of fluids and antibiotics for SIRS criteria  After the interventions stated above, I reevaluated the patient and found patient to be symptomatically improved Admission and further testing considered, she would benefit from mission to the hospital for further IV fluids and antibiotics.  She and son are agreeable to plan.          Final Clinical Impression(s) / ED Diagnoses Final diagnoses:  Acute diverticulitis  Nausea vomiting and diarrhea  SIRS (systemic inflammatory response syndrome) ([place])    Rx / DC Orders ED Discharge Orders     None         Hayden Rasmussen, MD 04/18/22 1914

## 2022-04-18 NOTE — H&P (Signed)
History and Physical    Patient: [name] MRN:[id number] DOB: 03/27/1941 DOA: 04/18/2022 DOS: the patient was seen and examined on 04/18/2022 PCP: [name], Richard W, MD  Patient coming from: Home  Chief Complaint:  Chief Complaint  Patient presents with   Fever   Abdominal Pain   HPI: [name] is a 81 y.o. female with medical history significant of chronic diastolic HF, autoimmune hemolytic anemia, DM2, HLD, HTN, vasculitis, PAF, PAH. Presenting with abdominal pain, N/V/D. She was admitted last week for emphysematous cystitis. She was started on abx and improved. She was sent home on 04/16/22 w/ augmentin, a foley, urology follow up for 7/13. She reports after she got home, she was significantly nauseous when taking augmentin. She had several episodes of vomiting and diarrhea. She felt feverish. She presented to the ED yesterday with concerns of bladder pain. She was evaluated and cleared to return home. When she returned home, her N/V/D persisted. When her symptoms did not improve this morning, she decided to come back to the ED for assistance.   Review of Systems: As mentioned in the history of present illness. All other systems reviewed and are negative. Past Medical History:  Diagnosis Date   Aortic atherosclerosis (HCC) 01/23/2018   Atrial fibrillation (HCC)    Benign positional vertigo 10/06/2011   Carotid artery disease (HCC)    Chemotherapy induced neutropenia (HCC) 07/21/2019   Cholelithiasis 01/23/2018   Cholelithiasis 01/23/2018   Chronic anticoagulation    Coronary artery disease    status post coronary artery bypass grafting times 07/10/2004   Diabetes mellitus    Hypercholesteremia    Hypertension    Mechanical heart valve present    H. aortic valve replacement at the time of bypass surgery October 2005   Moderate to severe pulmonary hypertension (HCC)    Peripheral arterial disease (HCC)    history of left common iliac artery PTA and stenting for a chronic  total occlusion 08/26/01   Past Surgical History:  Procedure Laterality Date   anterior repair  2009   AORTIC VALVE REPLACEMENT  2005   Dr. Bartle   CARDIAC CATHETERIZATION  11/10/2004   40% right common illiac, 70% in stent restenosis of distal left common illiac,    CARDIAC CATHETERIZATION  05/18/2004   LAD 50-70% midstenosis, RCA dominant w/50% stenosis, 50% Right common Illiac artery ostial stenosis, 90% in stent restenosis within midportion of left common illiac stent   Carotid Duplex  03/12/2012   RSA-elev. velocities suggestive of a 50-69% diameter reduction, Right&Left Bulb/Prox ICA-mild-mod.fibrous plaqueelevating Velocities abnormal study.   CHOLECYSTECTOMY N/A 03/01/2018   Procedure: LAPAROSCOPIC CHOLECYSTECTOMY;  Surgeon: Wyatt, James, MD;  Location: [facility];  Service: General;  Laterality: N/A;   COLONOSCOPY WITH PROPOFOL N/A 01/22/2018   Procedure: COLONOSCOPY WITH PROPOFOL;  Surgeon: Schooler, Vincent, MD;  Location: [facility];  Service: Endoscopy;  Laterality: N/A;   ESOPHAGOGASTRODUODENOSCOPY (EGD) WITH PROPOFOL N/A 01/22/2018   Procedure: ESOPHAGOGASTRODUODENOSCOPY (EGD) WITH PROPOFOL;  Surgeon: Schooler, Vincent, MD;  Location: [facility];  Service: Endoscopy;  Laterality: N/A;   ESOPHAGOGASTRODUODENOSCOPY (EGD) WITH PROPOFOL N/A 11/21/2018   Procedure: ESOPHAGOGASTRODUODENOSCOPY (EGD) WITH PROPOFOL;  Surgeon: Karki, Arya, MD;  Location: [facility];  Service: Gastroenterology;  Laterality: N/A;   ESOPHAGOGASTRODUODENOSCOPY (EGD) WITH PROPOFOL Left 02/27/2019   Procedure: ESOPHAGOGASTRODUODENOSCOPY (EGD) WITH PROPOFOL;  Surgeon: Outlaw, William, MD;  Location: [facility];  Service: Endoscopy;  Laterality: Left;   GIVENS CAPSULE STUDY N/A 11/21/2018   Procedure: GIVENS CAPSULE STUDY;  Surgeon: Karki,   Megan Salon, MD;  Location: [facility];  Service: Gastroenterology;  Laterality: N/A;  To be deployed during EGD   Lower Ext. Duplex  03/12/2012   Right Proximal CIA- vessel  narrowing w/elevated velocities 0-49% diameter reduction. Right SFA-mild mixed density plaque throughout vessel.   NM MYOCAR PERF WALL MOTION  05/19/2010   protocol: Persantine, post stress EF 65%, negative for ischemia, low risk scan   RIGHT HEART CATH N/A 06/27/2018   Procedure: RIGHT HEART CATH;  Surgeon: [name], MD;  Location: [facility];  Service: Cardiovascular;  Laterality: N/A;   TOTAL ABDOMINAL HYSTERECTOMY W/ BILATERAL SALPINGOOPHORECTOMY  1989   TRANSTHORACIC ECHOCARDIOGRAM  08/29/2012   Moderately calcified annulus of mitral valve, moderate regurg. of both mitral valve and tricuspid valve.    Social History:  reports that she has quit smoking. Her smoking use included cigarettes. She has a 30.00 pack-year smoking history. She has never used smokeless tobacco. She reports that she does not drink alcohol and does not use drugs.  Allergies  Allergen Reactions   Flagyl [Metronidazole] Rash    ALL-OVER BODY RASH   Ciprofloxacin     Other reaction(s): Stomach upset - tolerates Other reaction(s): Stomach upset - tolerates   Coreg [Carvedilol] Other (See Comments)    Terrible cramping in the feet and had a lot of bowel movements, but not diarrhea   Diflucan [Fluconazole] Diarrhea   Losartan Swelling    Patient doesn't recall site of swelling   Privigen [Immune Globulin (Human)] Other (See Comments)    Severe/excruciating pain   Sulfamethoxazole-Trimethoprim     Other reaction(s): stomach upset .   Verapamil Hives   Zetia [Ezetimibe] Other (See Comments)    Reaction not recalled   Zocor [Simvastatin - High Dose] Other (See Comments)    Reaction not recalled   Gatifloxacin Rash    Redness to skin around eye    Family History  Problem Relation Age of Onset   Heart disease Father    Hypertension Father    Hyperlipidemia Father    Breast cancer Neg Hx     Prior to Admission medications   Medication Sig Start Date End Date Taking? Authorizing Provider   acetaminophen (TYLENOL) 325 MG tablet Take 650 mg by mouth every 6 (six) hours as needed for mild pain.     [provider]  alendronate (FOSAMAX) 70 MG tablet Take 70 mg by mouth every Saturday. 12/06/21   [provider]  allopurinol (ZYLOPRIM) 100 MG tablet Take 100 mg by mouth daily.    [provider]  ALPRAZolam Duanne Moron) 0.5 MG tablet Take 0.25-0.5 mg by mouth at bedtime. 07/27/21   [provider]  amLODipine (NORVASC) 10 MG tablet TAKE 1 TABLET EVERY DAY Patient taking differently: Take 10 mg by mouth daily. 05/13/21   [name], MD  amoxicillin-clavulanate (AUGMENTIN) 500-125 MG tablet Take 1 tablet (500 mg total) by mouth 2 (two) times daily. 04/16/22   Danford, Suann Larry, MD  atorvastatin (LIPITOR) 80 MG tablet Take 1 tablet (80 mg total) by mouth daily at 6 PM. 11/22/18   [name], MD  Blood Glucose Monitoring Suppl (TRUE METRIX METER) w/Device KIT  03/18/21   [provider]  Cholecalciferol (VITAMIN D3) 50 MCG (2000 UT) capsule Take 2,000 Units by mouth daily with lunch. 11/26/18   [provider]  cloNIDine (CATAPRES) 0.2 MG tablet TAKE 1 TABLET TWICE DAILY Patient taking differently: Take 0.2 mg by mouth 2 (two) times daily. 06/22/21  McLean, Dalton S, MD  Cyanocobalamin (VITAMIN B12) 1000 MCG TBCR Take 1,000 mcg by mouth daily with lunch.    [provider]  estradiol (ESTRACE) 0.1 MG/GM vaginal cream Place 0.5g nightly for two weeks then twice a week after Patient taking differently: Place 1 Applicatorful vaginally 3 (three) times a week. Place 0.5g nightly for two weeks then twice a week after 09/19/21   Schroeder, [name] N, MD  famotidine (PEPCID) 20 MG tablet Take 1 tablet (20 mg total) by mouth 2 (two) times daily. 04/10/22   Iruku, Praveena, MD  furosemide (LASIX) 40 MG tablet TAKE 2 TABLETS (80 MG TOTAL) IN MORNING AND 1 AND 1/2 TABLETS (60 MG TOTAL) IN EVENING. CHANGE IN DOSAGE. Patient taking  differently: Take 40-80 mg by mouth See admin instructions. Takes 80 mg in the morning and 40 mg at night. 03/22/22   Clegg, Amy D, NP  glipiZIDE (GLUCOTROL) 5 MG tablet Take 1 tablet (5 mg total) by mouth daily. 01/09/22   Iruku, Praveena, MD  glucose blood (TRUE METRIX BLOOD GLUCOSE TEST) test strip Use 1-2 times daily    [provider]  hydrALAZINE (APRESOLINE) 100 MG tablet TAKE 1 TABLET THREE TIMES DAILY Patient taking differently: Take 100 mg by mouth 3 (three) times daily. 02/17/22   McLean, Dalton S, MD  insulin aspart (NOVOLOG) 100 UNIT/ML injection Take 2 units for blood sugar 200-250, 4 units for blood sugar 252-300, 6 units for blood sugar 301-350, 8 units for blood sugar greater than 350 and call MD. Patient taking differently: Inject 2-8 Units into the skin 3 (three) times daily with meals. Take 2 units for blood sugar 200-250, 4 units for blood sugar 252-300, 6 units for blood sugar 301-350, 8 units for blood sugar greater than 350 and call MD. 02/23/22   Causey, Lindsey Cornetto, NP  Insulin Syringe-Needle U-100 (B-D INS SYR ULTRAFINE .3CC/29G) 29G X 1/2" 0.3 ML MISC Use for sliding scale for breakthrough high blood sugar readings 02/24/22   Iruku, Praveena, MD  lactose free nutrition (BOOST) LIQD Take 237 mLs by mouth daily. (To supplement meals)     [provider]  lidocaine (XYLOCAINE) 2 % solution Use as directed 15 mLs in the mouth or throat as needed for mouth pain. Patient taking differently: Use as directed 15 mLs in the mouth or throat daily as needed for mouth pain. 01/30/22   Iruku, Praveena, MD  magnesium oxide (MAG-OX) 400 (241.3 Mg) MG tablet Take 1 tablet (400 mg total) by mouth daily. 01/30/18   Mathews, Elizabeth G, MD  metoprolol succinate (TOPROL-XL) 25 MG 24 hr tablet TAKE 1/2 TABLET EVERY DAY Patient taking differently: Take 12.5 mg by mouth daily. 07/19/21   McLean, Dalton S, MD  mirabegron ER (MYRBETRIQ) 25 MG TB24 tablet Take 1 tablet (25 mg total) by  mouth daily. 04/17/22   Wentz, Elliott, MD  mycophenolate (CELLCEPT) 500 MG tablet Take 2 tablets (1,000 mg total) by mouth daily. Patient taking differently: Take 500 mg by mouth 2 (two) times daily. 03/13/22   Causey, Lindsey Cornetto, NP  ondansetron (ZOFRAN-ODT) 4 MG disintegrating tablet Take 4 mg by mouth every 8 (eight) hours as needed for vomiting or nausea. 01/12/22   [provider]  pantoprazole (PROTONIX) 40 MG tablet Take 1 tablet (40 mg total) by mouth daily. 04/07/22   Iruku, Praveena, MD  predniSONE (DELTASONE) 10 MG tablet Take 20 mg by mouth daily with breakfast.    [provider]  simethicone (GAS-X) 80   MG chewable tablet Chew 1 tablet (80 mg total) by mouth 4 (four) times daily as needed for flatulence. 04/16/22 04/16/23  DanfordSuann Larry, MD  spironolactone (ALDACTONE) 25 MG tablet TAKE 1 TABLET EVERY DAY Patient taking differently: Take 25 mg by mouth daily. 05/13/21   [name], MD  tadalafil, PAH, (ADCIRCA) 20 MG tablet Take 2 tablets (40 mg total) by mouth daily. Patient taking differently: Take 40 mg by mouth at bedtime. 12/06/21   [name], MD  TRESIBA FLEXTOUCH 100 UNIT/ML FlexTouch Pen Inject 14 Units into the skin at bedtime. 02/20/22   [provider]  TRUE METRIX BLOOD GLUCOSE TEST test strip SMARTSIG:Via Meter 04/08/21   [provider]  vitamin C (ASCORBIC ACID) 500 MG tablet Take 500 mg by mouth daily with lunch.    [provider]  vitamin C (ASCORBIC ACID) 500 MG tablet     [provider]  warfarin (COUMADIN) 5 MG tablet Take 1 to 1 &1/2 tablets by mouth daily as directed by the coumadin clinic Patient taking differently: Take 5-7.5 mg by mouth See admin instructions. 5 mg on Monday,Thursday,Saturday 7.5 mg on Wednesday, Friday and Sunday 12/06/21   Lorretta Harp, MD    Physical Exam: Vitals:   04/18/22 1430 04/18/22 1432 04/18/22 1500 04/18/22 1501  BP:  (!) 147/50 (!) 148/54   Pulse: 72 82  87 81  Resp: 19 17 (!) 22 20  Temp:      TempSrc:      SpO2: 97% 99% 96% 97%  Weight:      Height:       General: 81 y.o. female resting in bed in NAD Eyes: PERRL, normal sclera ENMT: Nares patent w/o discharge, orophaynx clear, dentition normal, ears w/o discharge/lesions/ulcers Neck: Supple, trachea midline Cardiovascular: RRR, +S1, S2, no g/r, 3/6 SEM w/ systolic click equal pulses throughout Respiratory: CTABL, no w/r/r, normal WOB GI: BS+, ND, global TTP, no masses noted, no organomegaly noted MSK: No c/c; BLE 3+ pitting chronic Neuro: A&O x 3, no focal deficits Psyc: Appropriate interaction and affect, calm/cooperative  Data Reviewed:  Na+  137 K+  3.8 CO2  20 BUN  80 Scr  2.56 Ca2+  7.9 Albumin  2.9 Lactic acid  1.0 WBC  11.7 Hgb  10.0 Plt  130  CT ab/pelvis Sigmoid diverticulosis with wall thickening and pericolic inflammatory changes at the sigmoid colon over a moderate length, question diverticulitis versus colitis. Extensive atherosclerotic disease changes with significant narrowing of common iliac arteries bilaterally. Superior endplate compression deformity of L4 unchanged.  CXR: Enlargement of cardiac silhouette post CABG and AVR. No acute abnormalities.  Assessment and Plan: Sigmoid colitis/diveritculitis     - admit to inpt, tele  CKD 4      - she's at baseline, watch nephrotoxins  HLD      - statin  GERD     - hold PPI until c diff/GI PCR resulted  Chronic diastolic HF PAH PAF on coumadin     - continue home regimen when confirmed  DM2     - SSI, glucose checks, FLD  Thrombocytopenia Hx of autoimmune hemolytic anemia Hx of Vasculitis     - she is at baseline; no evidence of active hemolysis; monitor     - continue home regimen when confirmed  Recent emphysematous cystitis     - she was set to follow up with urology on 7/13 for foley removal; continue it for now; if she is still  in hospital on 7/13, can contact urology  HTN      - continue home regimen when confirmed  Advance Care Planning:   Code Status: FULL  Consults: None  Family Communication: w/ son at bedside  Severity of Illness: The appropriate patient status for this patient is INPATIENT. Inpatient status is judged to be reasonable and necessary in order to provide the required intensity of service to ensure the patient's safety. [name] presenting symptoms, physical exam findings, and initial radiographic and laboratory data in the context of their chronic comorbidities is felt to place them at high risk for further clinical deterioration. Furthermore, it is not anticipated that the patient will be medically stable for discharge from the hospital within 2 midnights of admission.   * I certify that at the point of admission it is my clinical judgment that the patient will require inpatient hospital care spanning beyond 2 midnights from the point of admission due to high intensity of service, high risk for further deterioration and high frequency of surveillance required.*  Author: Jonnie Finner, DO 04/18/2022 3:12 PM  For on call review [email or web address].

## 2022-04-18 NOTE — ED Triage Notes (Signed)
Pt bib ems from home c/o vomting, diarrhea, fevers, and abd pain.  Pt njust seen here yesterday. Pt rectal temp 101.2 upon arrival.

## 2022-04-18 NOTE — Progress Notes (Signed)
[place] for warfarin Indication: PAF, mech AVR  Allergies  Allergen Reactions   Flagyl [Metronidazole] Rash    ALL-OVER BODY RASH   Ciprofloxacin     Other reaction(s): Stomach upset - tolerates Other reaction(s): Stomach upset - tolerates   Coreg [Carvedilol] Other (See Comments)    Terrible cramping in the feet and had a lot of bowel movements, but not diarrhea   Diflucan [Fluconazole] Diarrhea   Losartan Swelling    Patient doesn't recall site of swelling   Privigen [Immune Globulin (Human)] Other (See Comments)    Severe/excruciating pain   Sulfamethoxazole-Trimethoprim     Other reaction(s): stomach upset .   Verapamil Hives   Zetia [Ezetimibe] Other (See Comments)    Reaction not recalled   Zocor [Simvastatin - High Dose] Other (See Comments)    Reaction not recalled   Gatifloxacin Rash    Redness to skin around eye    Patient Measurements: Height: '4\' 11"'$  (149.9 cm) Weight: 59.4 kg (131 lb) IBW/kg (Calculated) : 43.2  Vital Signs: Temp: 100.2 F (37.9 C) (07/11 1416) Temp Source: Rectal (07/11 1416) BP: 103/69 (07/11 1530) Pulse Rate: 84 (07/11 1530)  Labs: Recent Labs    04/16/22 0343 04/17/22 1241 04/18/22 1216  HGB 8.4* 9.8* 10.0*  HCT 25.5* 28.8* 30.0*  PLT 101* 122* 130*  APTT  --   --  69*  LABPROT 29.7*  --  37.5*  INR 2.9*  --  3.8*  CREATININE 2.65* 2.40* 2.56*     Estimated Creatinine Clearance: 13.1 mL/min (A) (by C-G formula based on SCr of 2.56 mg/dL (H)).  Medications:  (Not in a hospital admission)  Scheduled:    PRN: oxyCODONE  Assessment: 5 yoF with PMH PAF & mechanical AVR on warfarin, CAD s/p CABG 2005, PAH, DM, HTN, dCHF, vasculitis, autoimmune hemolytic anemia, admitted for persistent diverticulitis. Pharmacy consulted to continue warfarin while admitted.   Baseline INR SUPRAtherapeutic Prior anticoagulation: warfarin 5 mg daily except 7.5 mg Wed, Fri, Sun; LD 7/10  Significant  events:  Today, 04/18/2022: CBC: Hgb, Plt both low but stable INR supratherapeutic on admission  Major drug interactions: none - recent broad spectrum abx (Zosyn) can increase warfarin sensitivity No bleeding issues per nursing FLD ordered; intake not yet charted  Goal of Therapy: INR 2-3 (confirmed with clinic records)  Plan: Hold warfarin today with elevated INR Daily INR Monitor for signs of bleeding or thrombosis   Reuel Boom, PharmD, BCPS [phone] 04/18/2022, 6:18 PM

## 2022-04-18 NOTE — Progress Notes (Signed)
PHARMACY NOTE -  [name] has been assisting with dosing of Zosyn for intra-abdominal infection. Dosage remains stable at 3.375 g IV q12 hr (CrCl < 20 ml/min) and further renal adjustments per institutional Pharmacy antibiotic protocol  Pharmacy will sign off, following peripherally for culture results or dose adjustments. Please reconsult if a change in clinical status warrants re-evaluation of dosage.  [name], PharmD, BCPS [phone] 04/18/2022, 3:33 PM

## 2022-04-19 ENCOUNTER — Telehealth (HOSPITAL_COMMUNITY): Payer: Self-pay | Admitting: Pharmacy Technician

## 2022-04-19 ENCOUNTER — Other Ambulatory Visit (HOSPITAL_COMMUNITY): Payer: Self-pay

## 2022-04-19 DIAGNOSIS — Z7901 Long term (current) use of anticoagulants: Secondary | ICD-10-CM

## 2022-04-19 DIAGNOSIS — D591 Autoimmune hemolytic anemia, unspecified: Secondary | ICD-10-CM | POA: Diagnosis not present

## 2022-04-19 DIAGNOSIS — I5032 Chronic diastolic (congestive) heart failure: Secondary | ICD-10-CM | POA: Diagnosis not present

## 2022-04-19 DIAGNOSIS — A0472 Enterocolitis due to Clostridium difficile, not specified as recurrent: Secondary | ICD-10-CM

## 2022-04-19 DIAGNOSIS — I480 Paroxysmal atrial fibrillation: Secondary | ICD-10-CM

## 2022-04-19 DIAGNOSIS — N184 Chronic kidney disease, stage 4 (severe): Secondary | ICD-10-CM

## 2022-04-19 DIAGNOSIS — N3080 Other cystitis without hematuria: Secondary | ICD-10-CM

## 2022-04-19 DIAGNOSIS — K529 Noninfective gastroenteritis and colitis, unspecified: Secondary | ICD-10-CM | POA: Diagnosis not present

## 2022-04-19 LAB — GASTROINTESTINAL PANEL BY PCR, STOOL (REPLACES STOOL CULTURE)

## 2022-04-19 LAB — PROTIME-INR
INR: 3.8 — ABNORMAL HIGH (ref 0.8–1.2)
Prothrombin Time: 36.8 seconds — ABNORMAL HIGH (ref 11.4–15.2)

## 2022-04-19 LAB — COMPREHENSIVE METABOLIC PANEL
ALT: 20 U/L (ref 0–44)
AST: 29 U/L (ref 15–41)
Albumin: 2.6 g/dL — ABNORMAL LOW (ref 3.5–5.0)
Alkaline Phosphatase: 55 U/L (ref 38–126)
Anion gap: 12 (ref 5–15)
BUN: 64 mg/dL — ABNORMAL HIGH (ref 8–23)
CO2: 18 mmol/L — ABNORMAL LOW (ref 22–32)
Calcium: 7.9 mg/dL — ABNORMAL LOW (ref 8.9–10.3)
Chloride: 111 mmol/L (ref 98–111)
Creatinine, Ser: 2.19 mg/dL — ABNORMAL HIGH (ref 0.44–1.00)
GFR, Estimated: 22 mL/min — ABNORMAL LOW (ref >60)
Glucose, Bld: 65 mg/dL — ABNORMAL LOW (ref 70–99)
Potassium: 3.7 mmol/L (ref 3.5–5.1)
Sodium: 141 mmol/L (ref 135–145)
Total Bilirubin: 1 mg/dL (ref 0.3–1.2)
Total Protein: 5.1 g/dL — ABNORMAL LOW (ref 6.5–8.1)

## 2022-04-19 LAB — CBC
HCT: 29.3 % — ABNORMAL LOW (ref 36.0–46.0)
Hemoglobin: 9.5 g/dL — ABNORMAL LOW (ref 12.0–15.0)
MCH: 30.3 pg (ref 26.0–34.0)
MCHC: 32.4 g/dL (ref 30.0–36.0)
MCV: 93.3 fL (ref 80.0–100.0)
Platelets: 119 10*3/uL — ABNORMAL LOW (ref 150–400)
RBC: 3.14 MIL/uL — ABNORMAL LOW (ref 3.87–5.11)
RDW: 17.4 % — ABNORMAL HIGH (ref 11.5–15.5)
WBC: 9 10*3/uL (ref 4.0–10.5)
nRBC: 0 % (ref 0.0–0.2)

## 2022-04-19 LAB — GLUCOSE, CAPILLARY
Glucose-Capillary: 310 mg/dL — ABNORMAL HIGH (ref 70–99)
Glucose-Capillary: 243 mg/dL — ABNORMAL HIGH (ref 70–99)

## 2022-04-19 LAB — URINE CULTURE: Culture: NO GROWTH

## 2022-04-19 LAB — C DIFFICILE QUICK SCREEN W PCR REFLEX
C Diff antigen: POSITIVE — AB
C Diff toxin: NEGATIVE

## 2022-04-19 LAB — CLOSTRIDIUM DIFFICILE BY PCR, REFLEXED: Toxigenic C. Difficile by PCR: POSITIVE — AB

## 2022-04-19 MED ORDER — INSULIN ASPART 100 UNIT/ML IJ SOLN
0.0000 [IU] | Freq: Three times a day (TID) | INTRAMUSCULAR | Status: DC
  Administered 2022-04-19: 7 [IU] via SUBCUTANEOUS
  Administered 2022-04-20: 5 [IU] via SUBCUTANEOUS
  Administered 2022-04-20: 2 [IU] via SUBCUTANEOUS
  Administered 2022-04-20: 5 [IU] via SUBCUTANEOUS
  Administered 2022-04-21: 7 [IU] via SUBCUTANEOUS
  Administered 2022-04-21: 3 [IU] via SUBCUTANEOUS
  Administered 2022-04-21: 5 [IU] via SUBCUTANEOUS
  Administered 2022-04-22 (×2): 2 [IU] via SUBCUTANEOUS
  Administered 2022-04-22: 7 [IU] via SUBCUTANEOUS
  Administered 2022-04-23: 2 [IU] via SUBCUTANEOUS
  Administered 2022-04-23: 7 [IU] via SUBCUTANEOUS
  Administered 2022-04-23: 3 [IU] via SUBCUTANEOUS
  Administered 2022-04-24: 2 [IU] via SUBCUTANEOUS
  Administered 2022-04-24: 3 [IU] via SUBCUTANEOUS
  Administered 2022-04-24: 9 [IU] via SUBCUTANEOUS
  Administered 2022-04-25: 3 [IU] via SUBCUTANEOUS
  Administered 2022-04-25: 7 [IU] via SUBCUTANEOUS
  Administered 2022-04-26 (×2): 3 [IU] via SUBCUTANEOUS
  Administered 2022-04-26: 2 [IU] via SUBCUTANEOUS
  Administered 2022-04-27: 3 [IU] via SUBCUTANEOUS
  Administered 2022-04-27: 2 [IU] via SUBCUTANEOUS
  Administered 2022-04-27: 5 [IU] via SUBCUTANEOUS
  Administered 2022-04-28: 3 [IU] via SUBCUTANEOUS
  Administered 2022-04-28: 2 [IU] via SUBCUTANEOUS
  Administered 2022-04-28: 5 [IU] via SUBCUTANEOUS
  Administered 2022-04-29: 2 [IU] via SUBCUTANEOUS
  Administered 2022-04-29: 9 [IU] via SUBCUTANEOUS
  Administered 2022-04-29: 2 [IU] via SUBCUTANEOUS
  Administered 2022-04-30: 7 [IU] via SUBCUTANEOUS
  Administered 2022-04-30: 2 [IU] via SUBCUTANEOUS
  Administered 2022-04-30: 5 [IU] via SUBCUTANEOUS
  Administered 2022-05-01: 1 [IU] via SUBCUTANEOUS
  Administered 2022-05-01: 2 [IU] via SUBCUTANEOUS
  Administered 2022-05-01: 5 [IU] via SUBCUTANEOUS
  Administered 2022-05-02: 3 [IU] via SUBCUTANEOUS
  Administered 2022-05-02: 2 [IU] via SUBCUTANEOUS

## 2022-04-19 MED ORDER — CHLORHEXIDINE GLUCONATE CLOTH 2 % EX PADS
6.0000 | MEDICATED_PAD | Freq: Every day | CUTANEOUS | Status: DC
  Administered 2022-04-19 – 2022-05-02 (×14): 6 via TOPICAL

## 2022-04-19 MED ORDER — INSULIN ASPART 100 UNIT/ML IJ SOLN
0.0000 [IU] | Freq: Every day | INTRAMUSCULAR | Status: DC
  Administered 2022-04-19: 2 [IU] via SUBCUTANEOUS
  Administered 2022-04-20 – 2022-04-21 (×2): 4 [IU] via SUBCUTANEOUS
  Administered 2022-04-22 – 2022-04-23 (×2): 3 [IU] via SUBCUTANEOUS
  Administered 2022-04-24: 5 [IU] via SUBCUTANEOUS
  Administered 2022-04-25: 3 [IU] via SUBCUTANEOUS
  Administered 2022-04-26 – 2022-04-27 (×2): 4 [IU] via SUBCUTANEOUS
  Administered 2022-04-28: 5 [IU] via SUBCUTANEOUS
  Administered 2022-04-29 – 2022-04-30 (×2): 4 [IU] via SUBCUTANEOUS
  Administered 2022-05-01: 3 [IU] via SUBCUTANEOUS

## 2022-04-19 MED ORDER — FIDAXOMICIN 200 MG PO TABS
200.0000 mg | ORAL_TABLET | Freq: Two times a day (BID) | ORAL | Status: AC
  Administered 2022-04-19 – 2022-04-28 (×19): 200 mg via ORAL
  Filled 2022-04-19 (×21): qty 1

## 2022-04-19 MED ORDER — BOOST PLUS PO LIQD
237.0000 mL | Freq: Three times a day (TID) | ORAL | Status: DC
Start: 2022-04-19 — End: 2022-04-25
  Administered 2022-04-19 – 2022-04-25 (×16): 237 mL via ORAL
  Filled 2022-04-19 (×20): qty 237

## 2022-04-19 NOTE — Progress Notes (Signed)
ANTICOAGULATION CONSULT NOTE - Follow Up Consult  Pharmacy Consult for warfarin Indication: PAF, Mechanical AVR  Allergies  Allergen Reactions   Flagyl [Metronidazole] Rash and Other (See Comments)    ALL-OVER BODY RASH   Ciprofloxacin Nausea Only and Other (See Comments)    Stomach ache   Coreg [Carvedilol] Other (See Comments)    Terrible cramping in the feet and had a lot of bowel movements, but not diarrhea   Diflucan [Fluconazole] Diarrhea   Losartan Swelling and Other (See Comments)    Patient doesn't recall site of swelling   Privigen [Immune Globulin (Human)] Other (See Comments)    Severe/excruciating pain   Sulfamethoxazole-Trimethoprim Nausea Only and Other (See Comments)    Stomach upset   Verapamil Hives   Zetia [Ezetimibe] Other (See Comments)    Reaction not recalled   Zocor [Simvastatin - High Dose] Other (See Comments)    Reaction not recalled   Gatifloxacin Rash and Other (See Comments)    Redness to skin around eye    Patient Measurements: Height: '4\' 11"'$  (149.9 cm) Weight: 59.4 kg (131 lb) IBW/kg (Calculated) : 43.2  Vital Signs: Temp: 98.9 F (37.2 C) (07/12 0627) Temp Source: Oral (07/12 0627) BP: 148/48 (07/12 0627) Pulse Rate: 89 (07/12 0627)  Labs: Recent Labs    04/17/22 1241 04/18/22 1216 04/19/22 0859  HGB 9.8* 10.0* 9.5*  HCT 28.8* 30.0* 29.3*  PLT 122* 130* 119*  APTT  --  69*  --   LABPROT  --  37.5* 36.8*  INR  --  3.8* 3.8*  CREATININE 2.40* 2.56* 2.19*    Estimated Creatinine Clearance: 15.3 mL/min (A) (by C-G formula based on SCr of 2.19 mg/dL (H)).   Medications:  - Warfarin 5 mg tabs: 7.5 mg after supper on Sunday, Wednesday, and Friday. 5 mg on Monday, Tuesday, Thursday, and Saturday.   Assessment:  76 YOWF admitted for persistent diverticulitis. w/ hx of PAF and mechanical AVR on warfarin PTA, CAD (CABG 2005), PAH, CHF, HTN, DM, vasculitis, and autoimmune hemolytic anemia. Baseline INR supratherapeutic.  Pharmacy  consulted to dose warfarin.  Today, 04/19/2022 - INR 3.8, supratherapeutic - CBC low, but stable  - No signs of bleeding per LPN -Use of broad spectrum abx (Zosyn) may increase warfarin sensitivity, as may allopurinol, however, patient is maintained on PTA allopurinol regimen.  - Patient currently has diet order, no PO intake charted - Last warfarin dose on 04/17/22  Goal of Therapy:  INR 2-3 Monitor platelets by anticoagulation protocol: Yes   Plan:  - Hold warfarin dose for today - Daily INR and CBC - Monitor for signs of bleeding/thrombosis  Barnet Pall 04/19/2022,10:46 AM

## 2022-04-19 NOTE — Progress Notes (Signed)
PROGRESS NOTE    [name]  GEZ:[id number] DOB: 1941/07/27 DOA: 04/18/2022 PCP: [name], MD   Brief Narrative:  81 y.o. female with medical history significant of chronic diastolic HF, autoimmune hemolytic anemia, DM2, HLD, HTN, vasculitis, PAF, recent admission from 04/12/2022-04/16/2022 for emphysematous cystitis (colovesical fistula ruled out) treated with IV antibiotics and subsequent discharge on oral Augmentin presented with worsening vomiting and diarrhea with positive fevers.  On presentation, creatinine was 2.56 with WBC of 11.7.  CT of abdomen and pelvis showed possible sigmoid diverticulitis versus colitis.  She was started on broad-spectrum antibiotics.  Assessment & Plan:   Possible C. difficile colitis -Stool for C. difficile tested positive.  Imaging as above.  Initially started on broad-spectrum antibiotics.  DC broad-spectrum antibiotics.  Start Dificid.  CKD stage IV -Creatinine at baseline.  Monitor  Leukocytosis -Resolved  Thrombocytopenia -Chronic.  Questionable cause.  Platelets stable.  No signs of bleeding  Chronic diastolic CHF PAF on Coumadin PAH -Strict input and output.  Daily weights.  Fluid restriction.  Continue current regimen of Lasix along with hydralazine, Toprol-XL, spironolactone.  Continue Coumadin dose as per pharmacy.  Outpatient follow-up with cardiology. -Continue tadalafil  History of autoimmune hemolytic anemia History of vasculitis Anemia of chronic disease -Hemoglobin currently stable.  No evidence of hemolysis.  Outpatient follow-up with oncology.  Continue mycophenolate and prednisone.  Recent emphysematous cystitis Acute urinary retention -CT on admission did not show evidence of cystitis.  DC antibiotics.  Currently has a Foley catheter and was supposed to follow-up with urology on 04/20/2022 for possible removal.  Will contact urology on 04/20/2022.  Hypertension -continue current regimen.  Physical  deconditioning -PT eval  DVT prophylaxis: Coumadin Code Status: Full Family Communication: None at bedside Disposition Plan: Status is: Inpatient Remains inpatient appropriate because: Of severity of illness  Consultants: None  Procedures: None  Antimicrobials:  Anti-infectives (From admission, onward)    Start     Dose/Rate Route Frequency Ordered Stop   04/19/22 1000  fidaxomicin (DIFICID) tablet 200 mg        200 mg Oral 2 times daily 04/19/22 0810 04/29/22 0959   04/18/22 2200  piperacillin-tazobactam (ZOSYN) IVPB 3.375 g  Status:  Discontinued        3.375 g 12.5 mL/hr over 240 Minutes Intravenous Every 12 hours 04/18/22 1528 04/19/22 0823   04/18/22 1530  piperacillin-tazobactam (ZOSYN) IVPB 3.375 g  Status:  Discontinued        3.375 g 100 mL/hr over 30 Minutes Intravenous  Once 04/18/22 1528 04/18/22 1544   04/18/22 1445  piperacillin-tazobactam (ZOSYN) IVPB 3.375 g        3.375 g 100 mL/hr over 30 Minutes Intravenous  Once 04/18/22 1431 04/18/22 1518        Subjective: Patient seen and examined at bedside.  Please let the patient.  Still having some diarrhea with some nausea.  No overnight fever, chest pain, worsening shortness of breath reported.  Objective: Vitals:   04/18/22 2239 04/19/22 0133 04/19/22 0443 04/19/22 0627  BP: (!) 137/38 (!) 146/48 131/67 (!) 148/48  Pulse: 70 86 89 89  Resp:  '18 16 18  '$ Temp:  98.3 F (36.8 C) 97.9 F (36.6 C) 98.9 F (37.2 C)  TempSrc:  Oral Oral Oral  SpO2:  98% 99% 96%  Weight:      Height:        Intake/Output Summary (Last 24 hours) at 04/19/2022 1150 Last data filed at 04/19/2022 0939 Gross per 24  hour  Intake 988 ml  Output 1100 ml  Net -112 ml   Filed Weights   04/18/22 1206  Weight: 59.4 kg    Examination:  General exam: Appears calm and comfortable.  Chronically ill and deconditioned.  Currently on room air Respiratory system: Bilateral decreased breath sounds at bases Cardiovascular system: S1 &  S2 heard, Rate controlled Gastrointestinal system: Abdomen is nondistended, soft and mildly tender in the lower quadrant.  Normal bowel sounds heard. Extremities: No cyanosis, clubbing; bilateral lower extremity edema present Central nervous system: Alert and oriented.  Slow to respond.  No focal neurological deficits. Moving extremities Skin: No rashes, lesions or ulcers Psychiatry: Affect is mostly flat.  No signs of agitation currently.     Data Reviewed: I have personally reviewed following labs and imaging studies  CBC: Recent Labs  Lab 04/15/22 0922 04/16/22 0343 04/17/22 1241 04/18/22 1216 04/19/22 0859  WBC 5.6 4.1 10.7* 11.7* 9.0  NEUTROABS  --   --  9.9* 10.5*  --   HGB 9.3* 8.4* 9.8* 10.0* 9.5*  HCT 27.4* 25.5* 28.8* 30.0* 29.3*  MCV 90.1 93.4 90.9 92.0 93.3  PLT 115* 101* 122* 130* 703*   Basic Metabolic Panel: Recent Labs  Lab 04/15/22 0922 04/16/22 0343 04/17/22 1241 04/18/22 1216 04/19/22 0859  NA 138 138 134* 137 141  K 3.4* 4.2 3.7 3.8 3.7  CL 103 107 104 108 111  CO2 22 22 20* 20* 18*  GLUCOSE 95 250* 119* 96 65*  BUN 110* 100* 82* 80* 64*  CREATININE 2.66* 2.65* 2.40* 2.56* 2.19*  CALCIUM 8.1* 7.9* 7.6* 7.9* 7.9*   GFR: Estimated Creatinine Clearance: 15.3 mL/min (A) (by C-G formula based on SCr of 2.19 mg/dL (H)). Liver Function Tests: Recent Labs  Lab 04/13/22 0534 04/17/22 1241 04/18/22 1216 04/19/22 0859  AST '21 28 31 29  '$ ALT '18 22 22 20  '$ ALKPHOS 36* 47 53 55  BILITOT 1.1 0.7 0.8 1.0  PROT 4.9* 5.2* 5.3* 5.1*  ALBUMIN 2.7* 2.9* 2.9* 2.6*   No results for input(s): "LIPASE", "AMYLASE" in the last 168 hours. No results for input(s): "AMMONIA" in the last 168 hours. Coagulation Profile: Recent Labs  Lab 04/14/22 0425 04/15/22 0922 04/16/22 0343 04/18/22 1216 04/19/22 0859  INR 2.6* 3.2* 2.9* 3.8* 3.8*   Cardiac Enzymes: No results for input(s): "CKTOTAL", "CKMB", "CKMBINDEX", "TROPONINI" in the last 168 hours. BNP (last 3  results) No results for input(s): "PROBNP" in the last 8760 hours. HbA1C: No results for input(s): "HGBA1C" in the last 72 hours. CBG: Recent Labs  Lab 04/15/22 1617 04/15/22 2115 04/16/22 0814 04/16/22 1107 04/17/22 1303  GLUCAP 216* 340* 237* 224* 122*   Lipid Profile: No results for input(s): "CHOL", "HDL", "LDLCALC", "TRIG", "CHOLHDL", "LDLDIRECT" in the last 72 hours. Thyroid Function Tests: No results for input(s): "TSH", "T4TOTAL", "FREET4", "T3FREE", "THYROIDAB" in the last 72 hours. Anemia Panel: No results for input(s): "VITAMINB12", "FOLATE", "FERRITIN", "TIBC", "IRON", "RETICCTPCT" in the last 72 hours. Sepsis Labs: Recent Labs  Lab 04/12/22 1716 04/12/22 1940 04/18/22 1216 04/18/22 1415  LATICACIDVEN 1.1 1.5 1.0 1.0    Recent Results (from the past 240 hour(s))  Urine Culture     Status: None   Collection Time: 04/14/22  1:29 PM   Specimen: Urine, Catheterized  Result Value Ref Range Status   Specimen Description   Final    URINE, CATHETERIZED Performed at [facility] [address] [place],  [address]    Special Requests  Final    NONE Performed at [facility], [place] [address], [place], [place] [address]    Culture   Final    NO GROWTH Performed at [facility], [place] [address] [place], [place] [address]    Report Status 04/15/2022 FINAL  Final  Culture, blood (Routine x 2)     Status: None (Preliminary result)   Collection Time: 04/18/22 12:16 PM   Specimen: BLOOD  Result Value Ref Range Status   Specimen Description   Final    BLOOD RIGHT ANTECUBITAL Performed at [name] [address], [place], [place] [address]    Special Requests   Final    BOTTLES DRAWN AEROBIC AND ANAEROBIC Blood Culture results may not be optimal due to an excessive volume of blood received in culture bottles Performed at [facility] [address], [place], [place]  [address]    Culture   Final    NO GROWTH < 24 HOURS Performed at [facility] [address], [place], [place] [address]    Report Status PENDING  Incomplete  Culture, blood (Routine x 2)     Status: None (Preliminary result)   Collection Time: 04/18/22 12:26 PM   Specimen: BLOOD  Result Value Ref Range Status   Specimen Description   Final    BLOOD LEFT ANTECUBITAL Performed at [facility] [address], [place], [place] [address]    Special Requests   Final    BOTTLES DRAWN AEROBIC AND ANAEROBIC Blood Culture adequate volume Performed at [facility] [address], [place], [place] [address]    Culture   Final    NO GROWTH < 24 HOURS Performed at [facility] [address], [place], [place] [address]    Report Status PENDING  Incomplete  C Difficile Quick Screen w PCR reflex     Status: Abnormal   Collection Time: 04/19/22  2:00 AM   Specimen: STOOL  Result Value Ref Range Status   C Diff antigen POSITIVE (A) NEGATIVE Final   C Diff toxin NEGATIVE NEGATIVE Final   C Diff interpretation Results are indeterminate. See PCR results.  Final    Comment: Performed at [facility], [place] [address], [place],  [address]  Gastrointestinal Panel by PCR , Stool     Status: None   Collection Time: 04/19/22  2:00 AM   Specimen: Stool  Result Value Ref Range Status   Campylobacter species NOT DETECTED NOT DETECTED Final   Plesimonas shigelloides NOT DETECTED NOT DETECTED Final   Salmonella species NOT DETECTED NOT DETECTED Final   Yersinia enterocolitica NOT DETECTED NOT DETECTED Final   Vibrio species NOT DETECTED NOT DETECTED Final   Vibrio cholerae NOT DETECTED NOT DETECTED Final   Enteroaggregative E coli (EAEC) NOT DETECTED NOT DETECTED Final   Enteropathogenic E coli (EPEC) NOT DETECTED NOT DETECTED Final   Enterotoxigenic E coli (ETEC) NOT DETECTED NOT DETECTED Final   Shiga like toxin producing E  coli (STEC) NOT DETECTED NOT DETECTED Final   Shigella/Enteroinvasive E coli (EIEC) NOT DETECTED NOT DETECTED Final   Cryptosporidium NOT DETECTED NOT DETECTED Final   Cyclospora cayetanensis NOT DETECTED NOT DETECTED Final   Entamoeba histolytica NOT DETECTED NOT DETECTED Final   Giardia lamblia NOT DETECTED NOT DETECTED Final   Adenovirus F40/41 NOT DETECTED NOT DETECTED Final   Astrovirus NOT DETECTED NOT DETECTED Final   Norovirus GI/GII NOT DETECTED  NOT DETECTED Final   Rotavirus A NOT DETECTED NOT DETECTED Final   Sapovirus (I, II, IV, and V) NOT DETECTED NOT DETECTED Final    Comment: Performed at [facility], [place] [address] [place], [place] [address]  C. Diff by PCR, Reflexed     Status: Abnormal   Collection Time: 04/19/22  2:00 AM  Result Value Ref Range Status   Toxigenic C. Difficile by PCR POSITIVE (A) NEGATIVE Final    Comment: Positive for toxigenic C. difficile with little to no toxin production. Only treat if clinical presentation suggests symptomatic illness. Performed at [facility], [place] [address], [place], [place] [address]          Radiology Studies: CT Abdomen Pelvis Wo Contrast  Result Date: 04/18/2022 CLINICAL DATA:  Abdominal pain, vomiting, diarrhea, fever, rectal temp 101.2 on arrival EXAM: CT ABDOMEN AND PELVIS WITHOUT CONTRAST TECHNIQUE: Multidetector CT imaging of the abdomen and pelvis was performed following the standard protocol without IV contrast. RADIATION DOSE REDUCTION: This exam was performed according to the departmental dose-optimization program which includes automated exposure control, adjustment of the mA and/or kV according to patient size and/or use of iterative reconstruction technique. COMPARISON:  CT pelvis 04/14/2022, CT abdomen and pelvis 04/12/2022 FINDINGS: Lower chest: Peribronchial thickening with minimal bibasilar atelectasis. Large calcified granuloma at base of LEFT lower lobe. Enlargement of cardiac chambers.  Hepatobiliary: Gallbladder surgically absent. Liver normal appearance Pancreas: Normal appearance Spleen: Calcified granulomata within spleen, which is otherwise normal. Adrenals/Urinary Tract: Adrenal glands normal appearance. Scattered renal vascular calcifications. No renal mass, hydronephrosis, or hydroureter. No definite urinary tract calcification. Bladder decompressed by Foley catheter. Stomach/Bowel: Appendix not visualized. Diverticulosis of sigmoid colon. Wall thickening and pericolic inflammatory changes at the sigmoid colon over a moderate length, question diverticulitis versus colitis. No extraluminal gas or abscess. Diffuse stranding of pericolic fat extending into sigmoid mesocolon. Stomach and remaining bowel loops unremarkable. Vascular/Lymphatic: Atherosclerotic calcifications aorta, iliac arteries, visceral arteries, coronary arteries. Aorta normal caliber. Significant narrowing of common iliac arteries. No adenopathy. Reproductive: Uterus surgically absent. Ovaries not definitely visualized. Other: Minimal free fluid in pelvis.  No free air.  No hernia. Musculoskeletal: Osseous demineralization. Degenerative changes lumbar spine and RIGHT hip joint. Superior endplate compression fracture L4 unchanged. IMPRESSION: Sigmoid diverticulosis with wall thickening and pericolic inflammatory changes at the sigmoid colon over a moderate length, question diverticulitis versus colitis. Extensive atherosclerotic disease changes with significant narrowing of common iliac arteries bilaterally. Superior endplate compression deformity of L4 unchanged. Aortic Atherosclerosis (ICD10-I70.0). Electronically Signed   By: Lavonia Dana M.D.   On: 04/18/2022 14:23   DG Chest Port 1 View  Result Date: 04/18/2022 CLINICAL DATA:  Questionable sepsis, vomiting, diarrhea, fever, abdominal pain, history hypertension EXAM: PORTABLE CHEST 1 VIEW COMPARISON:  Portable exam 1223 hours compared to 02/04/2022 FINDINGS:  Enlargement of cardiac silhouette post CABG and AVR. Atherosclerotic calcification aorta. Pulmonary vascularity normal. Mild chronic bronchitic changes with calcified granuloma RIGHT mid lung. Linear scarring LEFT lower lobe. No acute infiltrate, pleural effusion, or pneumothorax. Bones demineralized with BILATERAL glenohumeral degenerative changes and scoliosis. IMPRESSION: Enlargement of cardiac silhouette post CABG and AVR. No acute abnormalities. Aortic Atherosclerosis (ICD10-I70.0). Electronically Signed   By: Lavonia Dana M.D.   On: 04/18/2022 13:01        Scheduled Meds:  allopurinol  100 mg Oral Daily   ALPRAZolam  0.25 mg Oral QHS   amLODipine  10 mg Oral Daily   aspirin EC  81 mg Oral  Daily   atorvastatin  80 mg Oral q1800   cloNIDine  0.2 mg Oral BID   fidaxomicin  200 mg Oral BID   folic acid  1 mg Oral Daily   furosemide  80 mg Oral Daily   And   furosemide  40 mg Oral QPM   hydrALAZINE  100 mg Oral TID   metoprolol succinate  12.5 mg Oral Daily   mirabegron ER  25 mg Oral Daily   mycophenolate  500 mg Oral BID   pantoprazole  40 mg Oral Daily   predniSONE  20 mg Oral Q breakfast   spironolactone  25 mg Oral Daily   tadalafil  40 mg Oral QPM   Warfarin - Pharmacist Dosing Inpatient   Does not apply q1600   Continuous Infusions:        [name], MD [facility] 04/19/2022, 11:50 AM

## 2022-04-19 NOTE — Telephone Encounter (Signed)
Patient Advocate Encounter  Patient is approved through the [employer] Patient Assistance Program for Dificid through 10/08/2022.   Medication will be mailed to patient's home.   Lyndel Safe, [place] Patient Advocate Specialist [place] Patient Advocate Team Direct Number: [phone]  Fax: [phone]

## 2022-04-19 NOTE — TOC Benefit Eligibility Note (Signed)
Patient Teacher, English as a foreign language completed.    The patient is currently admitted and upon discharge could be taking Dificid 200 mg tablets.  The current 30 day co-pay is, $1,529.41.   The patient is currently admitted and upon discharge could be taking Vancomycin 125 mg capsules  Requires Prior Authorization  The patient is insured through [place], [place] Patient Advocate Specialist [place] Patient Advocate Team Direct Number: [phone]  Fax: [phone]

## 2022-04-19 NOTE — TOC Benefit Eligibility Note (Signed)
Patient Advocate Encounter  Patient is approved through the [employer] Patient Assistance Program for Dificid through 10/08/2022.   Medication will be mailed to patient's home.   Lyndel Safe, [place] Patient Advocate Specialist [place] Patient Advocate Team Direct Number: [phone]  Fax: [phone]

## 2022-04-19 NOTE — Progress Notes (Signed)
Initial Nutrition Assessment  DOCUMENTATION CODES:   Non-severe (moderate) malnutrition in context of chronic illness  INTERVENTION:   -Boost Plus TID- Each supplement provides 360kcal and 14g protein.     NUTRITION DIAGNOSIS:   Moderate Malnutrition related to chronic illness (CHF) as evidenced by mild muscle depletion, energy intake < or equal to 75% for > or equal to 1 month.  GOAL:   Patient will meet greater than or equal to 90% of their needs  MONITOR:   Supplement acceptance, Weight trends, PO intake, I & O's, Labs, Skin  REASON FOR ASSESSMENT:   Malnutrition Screening Tool    ASSESSMENT:   81 y.o. female with medical history significant of chronic diastolic HF, autoimmune hemolytic anemia, DM2, HLD, HTN, vasculitis, PAF, recent admission from 04/12/2022-04/16/2022 for emphysematous cystitis (colovesical fistula ruled out) treated with IV antibiotics and subsequent discharge on oral Augmentin presented with worsening vomiting and diarrhea with positive fevers.  Patient in room, states she is tolerating full liquids well. Had grits and was trying to eat some yogurt during visit. States she really likes Boost supplements, have ordered these. Now on a soft diet.  Was not eating well PTA d/t vomiting and diarrhea. Has had taste changes since starting new medications.  Reports taking Vitamin C, D, folate and probiotics.  Per weight records, no weight loss. Weight changes likely masked by fluid.  Medications: folic acid  Labs reviewed:  C.diff+  NUTRITION - FOCUSED PHYSICAL EXAM:  Flowsheet Row Most Recent Value  Orbital Region Mild depletion  Upper Arm Region No depletion  Thoracic and Lumbar Region No depletion  Buccal Region No depletion  Temple Region Mild depletion  Clavicle Bone Region No depletion  Clavicle and Acromion Bone Region No depletion  Scapular Bone Region No depletion  Dorsal Hand Moderate depletion  Patellar Region No depletion  Anterior Thigh  Region No depletion  Posterior Calf Region No depletion  Edema (RD Assessment) Moderate  [BLE edema]  Hair Reviewed  [thinning]  Eyes Reviewed  Mouth Reviewed  Skin Reviewed       Diet Order:   Diet Order             DIET SOFT Room service appropriate? Yes; Fluid consistency: Thin  Diet effective now                   EDUCATION NEEDS:   No education needs have been identified at this time  Skin:  Skin Assessment: Reviewed RN Assessment  Last BM:  7/12 -type 2  Height:   Ht Readings from Last 1 Encounters:  04/18/22 '4\' 11"'$  (1.499 m)    Weight:   Wt Readings from Last 1 Encounters:  04/18/22 59.4 kg    BMI:  Body mass index is 26.46 kg/m.  Estimated Nutritional Needs:   Kcal:  1600-1800  Protein:  75-90g  Fluid:  1.8L/day  Clayton Bibles, MS, RD, LDN Inpatient Clinical Dietitian Contact information available via Amion

## 2022-04-19 NOTE — Telephone Encounter (Signed)
Pharmacy Patient Advocate Encounter  Insurance verification completed.    The patient is insured through [employer]   The patient is currently admitted and ran test claims for the following: Dificid, Vancomycin.  Copays and coinsurance results were relayed to Inpatient clinical team.

## 2022-04-20 ENCOUNTER — Ambulatory Visit: Payer: Self-pay

## 2022-04-20 DIAGNOSIS — Z7901 Long term (current) use of anticoagulants: Secondary | ICD-10-CM | POA: Diagnosis not present

## 2022-04-20 DIAGNOSIS — I7782 Antineutrophilic cytoplasmic antibody (ANCA) vasculitis: Secondary | ICD-10-CM

## 2022-04-20 DIAGNOSIS — D591 Autoimmune hemolytic anemia, unspecified: Secondary | ICD-10-CM | POA: Diagnosis not present

## 2022-04-20 DIAGNOSIS — D696 Thrombocytopenia, unspecified: Secondary | ICD-10-CM

## 2022-04-20 DIAGNOSIS — I5032 Chronic diastolic (congestive) heart failure: Secondary | ICD-10-CM | POA: Diagnosis not present

## 2022-04-20 DIAGNOSIS — K529 Noninfective gastroenteritis and colitis, unspecified: Secondary | ICD-10-CM | POA: Diagnosis not present

## 2022-04-20 LAB — COMPREHENSIVE METABOLIC PANEL
ALT: 17 U/L (ref 0–44)
AST: 24 U/L (ref 15–41)
Albumin: 2.3 g/dL — ABNORMAL LOW (ref 3.5–5.0)
Alkaline Phosphatase: 48 U/L (ref 38–126)
Anion gap: 8 (ref 5–15)
BUN: 65 mg/dL — ABNORMAL HIGH (ref 8–23)
CO2: 19 mmol/L — ABNORMAL LOW (ref 22–32)
Calcium: 7.6 mg/dL — ABNORMAL LOW (ref 8.9–10.3)
Chloride: 108 mmol/L (ref 98–111)
Creatinine, Ser: 2.36 mg/dL — ABNORMAL HIGH (ref 0.44–1.00)
GFR, Estimated: 20 mL/min — ABNORMAL LOW (ref >60)
Glucose, Bld: 204 mg/dL — ABNORMAL HIGH (ref 70–99)
Potassium: 4.1 mmol/L (ref 3.5–5.1)
Sodium: 135 mmol/L (ref 135–145)
Total Bilirubin: 0.4 mg/dL (ref 0.3–1.2)
Total Protein: 4.5 g/dL — ABNORMAL LOW (ref 6.5–8.1)

## 2022-04-20 LAB — CBC WITH DIFFERENTIAL/PLATELET
Abs Immature Granulocytes: 0.06 10*3/uL (ref 0.00–0.07)
Basophils Absolute: 0 10*3/uL (ref 0.0–0.1)
Basophils Relative: 0 %
Eosinophils Absolute: 0.1 10*3/uL (ref 0.0–0.5)
Eosinophils Relative: 1 %
HCT: 25.1 % — ABNORMAL LOW (ref 36.0–46.0)
Hemoglobin: 8.1 g/dL — ABNORMAL LOW (ref 12.0–15.0)
Immature Granulocytes: 1 %
Lymphocytes Relative: 8 %
Lymphs Abs: 0.5 10*3/uL — ABNORMAL LOW (ref 0.7–4.0)
MCH: 30.2 pg (ref 26.0–34.0)
MCHC: 32.3 g/dL (ref 30.0–36.0)
MCV: 93.7 fL (ref 80.0–100.0)
Monocytes Absolute: 0.4 10*3/uL (ref 0.1–1.0)
Monocytes Relative: 6 %
Neutro Abs: 5.6 10*3/uL (ref 1.7–7.7)
Neutrophils Relative %: 84 %
Platelets: 118 10*3/uL — ABNORMAL LOW (ref 150–400)
RBC: 2.68 MIL/uL — ABNORMAL LOW (ref 3.87–5.11)
RDW: 17.2 % — ABNORMAL HIGH (ref 11.5–15.5)
WBC: 6.7 10*3/uL (ref 4.0–10.5)
nRBC: 0 % (ref 0.0–0.2)

## 2022-04-20 LAB — GLUCOSE, CAPILLARY
Glucose-Capillary: 166 mg/dL — ABNORMAL HIGH (ref 70–99)
Glucose-Capillary: 295 mg/dL — ABNORMAL HIGH (ref 70–99)
Glucose-Capillary: 285 mg/dL — ABNORMAL HIGH (ref 70–99)
Glucose-Capillary: 338 mg/dL — ABNORMAL HIGH (ref 70–99)

## 2022-04-20 LAB — PROTIME-INR
INR: 3.3 — ABNORMAL HIGH (ref 0.8–1.2)
Prothrombin Time: 32.9 seconds — ABNORMAL HIGH (ref 11.4–15.2)

## 2022-04-20 LAB — MAGNESIUM: Magnesium: 2.1 mg/dL (ref 1.7–2.4)

## 2022-04-20 MED ORDER — WARFARIN SODIUM 2.5 MG PO TABS
2.5000 mg | ORAL_TABLET | Freq: Once | ORAL | Status: DC
  Filled 2022-04-20: qty 1

## 2022-04-20 NOTE — Progress Notes (Addendum)
PROGRESS NOTE    [name]  EXH:[id number] DOB: 22-Mar-1941 DOA: 04/18/2022 PCP: [name], MD   Brief Narrative:  81 y.o. female with medical history significant of chronic diastolic HF, autoimmune hemolytic anemia, DM2, HLD, HTN, vasculitis, PAF, recent admission from 04/12/2022-04/16/2022 for emphysematous cystitis (colovesical fistula ruled out) treated with IV antibiotics and subsequent discharge on oral Augmentin presented with worsening vomiting and diarrhea with positive fevers.  On presentation, creatinine was 2.56 with WBC of 11.7.  CT of abdomen and pelvis showed possible sigmoid diverticulitis versus colitis.  She was started on broad-spectrum antibiotics.  She subsequently tested positive for C. difficile and was started on Dificid; antibiotics were discontinued.  Assessment & Plan:   Possible C. difficile colitis/diverticulitis -Stool for C. difficile tested positive.  Imaging as above.  Broad-spectrum antibiotics discontinued on 04/19/2022.  Started Dificid on 04/19/2022: Finish 10-day course of therapy.  CKD stage IV -Creatinine at baseline.  Monitor  Leukocytosis -Resolved  Thrombocytopenia -Chronic.  Questionable cause.  Platelets stable.  No signs of bleeding  Chronic diastolic CHF PAF on Coumadin PAH Supratherapeutic INR -Strict input and output.  Daily weights.  Fluid restriction.  Continue current regimen of Lasix along with hydralazine, Toprol-XL, spironolactone.  INR 3.3 today.  Hold dose today.  Repeat a.m. INR.  Pharmacy to dose Coumadin.  Outpatient follow-up with cardiology. -Continue tadalafil  History of autoimmune hemolytic anemia History of vasculitis Anemia of chronic disease -Hemoglobin 8.1 this morning.  Monitor.  No evidence of hemolysis.  Outpatient follow-up with oncology.  Continue mycophenolate and prednisone.  Recent emphysematous cystitis Acute urinary retention -CT on admission did not show evidence of cystitis.  DC'd antibiotics  on 04/19/2022.  Currently has a Foley catheter and was supposed to follow-up with urology on 04/20/2022 for possible removal.  Will contact urology today.  Hypertension -continue current regimen.  Physical deconditioning -PT eval  Moderate malnutrition -Follow nutrition recommendations  DVT prophylaxis: Coumadin Code Status: Full Family Communication: None at bedside Disposition Plan: Status is: Inpatient Remains inpatient appropriate because: Of severity of illness  Consultants: Contact urology today  Procedures: None  Antimicrobials:  Anti-infectives (From admission, onward)    Start     Dose/Rate Route Frequency Ordered Stop   04/19/22 1000  fidaxomicin (DIFICID) tablet 200 mg        200 mg Oral 2 times daily 04/19/22 0810 04/29/22 0959   04/18/22 2200  piperacillin-tazobactam (ZOSYN) IVPB 3.375 g  Status:  Discontinued        3.375 g 12.5 mL/hr over 240 Minutes Intravenous Every 12 hours 04/18/22 1528 04/19/22 0823   04/18/22 1530  piperacillin-tazobactam (ZOSYN) IVPB 3.375 g  Status:  Discontinued        3.375 g 100 mL/hr over 30 Minutes Intravenous  Once 04/18/22 1528 04/18/22 1544   04/18/22 1445  piperacillin-tazobactam (ZOSYN) IVPB 3.375 g        3.375 g 100 mL/hr over 30 Minutes Intravenous  Once 04/18/22 1431 04/18/22 1518        Subjective: Patient seen and examined at bedside.  Feels slightly better.  Diarrhea is improving.  Still has some intermittent nausea and abdominal pain.  No fever, vomiting, agitation reported.   objective: Vitals:   04/19/22 0627 04/19/22 1437 04/19/22 2041 04/20/22 0501  BP: (!) 148/48 135/71 (!) 125/42 (!) 118/51  Pulse: 89 78 85 68  Resp: '18 18 16 15  '$ Temp: 98.9 F (37.2 C) 98.2 F (36.8 C) 98.7 F (37.1 C) 98.1 F (  36.7 C)  TempSrc: Oral Oral Oral Oral  SpO2: 96% 97% 98% 97%  Weight:      Height:        Intake/Output Summary (Last 24 hours) at 04/20/2022 0752 Last data filed at 04/20/2022 0115 Gross per 24 hour   Intake 578 ml  Output 750 ml  Net -172 ml    Filed Weights   04/18/22 1206  Weight: 59.4 kg    Examination:  General: On room air.  No distress.  Chronically ill and deconditioned looking ENT/neck: No thyromegaly.  JVD is not elevated  respiratory: Decreased breath sounds at bases bilaterally with some crackles; no wheezing  CVS: S1-S2 heard, rate controlled currently Abdominal: Soft, nontender, slightly distended; no organomegaly, bowel sounds are heard Extremities: Bilateral lower extremity edema present; no cyanosis  CNS: Awake and alert.  No focal neurologic deficit.  Moves extremities Lymph: No obvious lymphadenopathy Skin: No obvious ecchymosis/lesions  psych: Mostly flat affect.  Currently not agitated musculoskeletal: No obvious joint swelling/deformity     Data Reviewed: I have personally reviewed following labs and imaging studies  CBC: Recent Labs  Lab 04/16/22 0343 04/17/22 1241 04/18/22 1216 04/19/22 0859 04/20/22 0537  WBC 4.1 10.7* 11.7* 9.0 6.7  NEUTROABS  --  9.9* 10.5*  --  5.6  HGB 8.4* 9.8* 10.0* 9.5* 8.1*  HCT 25.5* 28.8* 30.0* 29.3* 25.1*  MCV 93.4 90.9 92.0 93.3 93.7  PLT 101* 122* 130* 119* 118*    Basic Metabolic Panel: Recent Labs  Lab 04/16/22 0343 04/17/22 1241 04/18/22 1216 04/19/22 0859 04/20/22 0537  NA 138 134* 137 141 135  K 4.2 3.7 3.8 3.7 4.1  CL 107 104 108 111 108  CO2 22 20* 20* 18* 19*  GLUCOSE 250* 119* 96 65* 204*  BUN 100* 82* 80* 64* 65*  CREATININE 2.65* 2.40* 2.56* 2.19* 2.36*  CALCIUM 7.9* 7.6* 7.9* 7.9* 7.6*  MG  --   --   --   --  2.1    GFR: Estimated Creatinine Clearance: 14.2 mL/min (A) (by C-G formula based on SCr of 2.36 mg/dL (H)). Liver Function Tests: Recent Labs  Lab 04/17/22 1241 04/18/22 1216 04/19/22 0859 04/20/22 0537  AST '28 31 29 24  '$ ALT '22 22 20 17  '$ ALKPHOS 47 53 55 48  BILITOT 0.7 0.8 1.0 0.4  PROT 5.2* 5.3* 5.1* 4.5*  ALBUMIN 2.9* 2.9* 2.6* 2.3*    No results for  input(s): "LIPASE", "AMYLASE" in the last 168 hours. No results for input(s): "AMMONIA" in the last 168 hours. Coagulation Profile: Recent Labs  Lab 04/15/22 0922 04/16/22 0343 04/18/22 1216 04/19/22 0859 04/20/22 0537  INR 3.2* 2.9* 3.8* 3.8* 3.3*    Cardiac Enzymes: No results for input(s): "CKTOTAL", "CKMB", "CKMBINDEX", "TROPONINI" in the last 168 hours. BNP (last 3 results) No results for input(s): "PROBNP" in the last 8760 hours. HbA1C: No results for input(s): "HGBA1C" in the last 72 hours. CBG: Recent Labs  Lab 04/16/22 0814 04/16/22 1107 04/17/22 1303 04/19/22 1709 04/19/22 2137  GLUCAP 237* 224* 122* 310* 243*    Lipid Profile: No results for input(s): "CHOL", "HDL", "LDLCALC", "TRIG", "CHOLHDL", "LDLDIRECT" in the last 72 hours. Thyroid Function Tests: No results for input(s): "TSH", "T4TOTAL", "FREET4", "T3FREE", "THYROIDAB" in the last 72 hours. Anemia Panel: No results for input(s): "VITAMINB12", "FOLATE", "FERRITIN", "TIBC", "IRON", "RETICCTPCT" in the last 72 hours. Sepsis Labs: Recent Labs  Lab 04/18/22 1216 04/18/22 1415  LATICACIDVEN 1.0 1.0     Recent Results (from  the past 240 hour(s))  Urine Culture     Status: None   Collection Time: 04/14/22  1:29 PM   Specimen: Urine, Catheterized  Result Value Ref Range Status   Specimen Description   Final    URINE, CATHETERIZED Performed at [facility] [address], [place], [place] [address]    Special Requests   Final    NONE Performed at [facility], [place] [address], [place], [place] [address]    Culture   Final    NO GROWTH Performed at [facility], [place] [address], [place], [place] [address]    Report Status 04/15/2022 FINAL  Final  Culture, blood (Routine x 2)     Status: None (Preliminary result)   Collection Time: 04/18/22 12:16 PM   Specimen: BLOOD  Result Value Ref Range Status   Specimen Description   Final    BLOOD RIGHT  ANTECUBITAL Performed at [facility] [address], [place], [place] [address]    Special Requests   Final    BOTTLES DRAWN AEROBIC AND ANAEROBIC Blood Culture results may not be optimal due to an excessive volume of blood received in culture bottles Performed at [facility] [address] [place], [place] [address]    Culture   Final    NO GROWTH 2 DAYS Performed at [facility] [address] [place], [place] [address]    Report Status PENDING  Incomplete  Culture, blood (Routine x 2)     Status: None (Preliminary result)   Collection Time: 04/18/22 12:26 PM   Specimen: BLOOD  Result Value Ref Range Status   Specimen Description   Final    BLOOD LEFT ANTECUBITAL Performed at [facility] [address] [place], [place] [address]    Special Requests   Final    BOTTLES DRAWN AEROBIC AND ANAEROBIC Blood Culture adequate volume Performed at [facility] [address], [place], [place] [address]    Culture   Final    NO GROWTH 2 DAYS Performed at [facility] [address], [place], [place] [address]    Report Status PENDING  Incomplete  Urine Culture     Status: None   Collection Time: 04/18/22 12:29 PM   Specimen: In/Out Cath Urine  Result Value Ref Range Status   Specimen Description   Final    IN/OUT CATH URINE Performed at [facility], [place] [address] [place], [place] [address]    Special Requests   Final    NONE Performed at [facility], [place] [address] [place], [place] [address]    Culture   Final    NO GROWTH Performed at [facility], [place] [address] [place], [place] [address]    Report Status 04/19/2022 FINAL  Final  C Difficile Quick Screen w PCR reflex     Status: Abnormal   Collection Time: 04/19/22  2:00 AM   Specimen: STOOL  Result Value Ref Range Status   C Diff antigen POSITIVE (A) NEGATIVE Final   C Diff  toxin NEGATIVE NEGATIVE Final   C Diff interpretation Results are indeterminate. See PCR results.  Final    Comment: Performed at [facility], [place] [address], [place],  [address]  Gastrointestinal Panel by PCR , Stool     Status: None   Collection Time: 04/19/22  2:00 AM   Specimen: Stool  Result Value Ref Range Status   Campylobacter species NOT DETECTED NOT DETECTED Final   Plesimonas shigelloides NOT DETECTED NOT DETECTED Final   Salmonella species NOT DETECTED NOT DETECTED Final   Yersinia enterocolitica NOT DETECTED NOT DETECTED Final   Vibrio species NOT DETECTED NOT DETECTED Final   Vibrio cholerae NOT DETECTED NOT DETECTED Final   Enteroaggregative E coli (EAEC) NOT DETECTED NOT DETECTED Final   Enteropathogenic E coli (EPEC) NOT DETECTED NOT DETECTED Final   Enterotoxigenic E coli (ETEC) NOT DETECTED NOT DETECTED Final   Shiga like toxin producing E coli (STEC) NOT DETECTED NOT DETECTED Final   Shigella/Enteroinvasive E coli (EIEC) NOT DETECTED NOT DETECTED Final   Cryptosporidium NOT DETECTED NOT DETECTED Final   Cyclospora cayetanensis NOT DETECTED NOT DETECTED Final   Entamoeba histolytica NOT DETECTED NOT DETECTED Final   Giardia lamblia NOT DETECTED NOT DETECTED Final   Adenovirus F40/41 NOT DETECTED NOT DETECTED Final   Astrovirus NOT DETECTED NOT DETECTED Final   Norovirus GI/GII NOT DETECTED NOT DETECTED Final   Rotavirus A NOT DETECTED NOT DETECTED Final   Sapovirus (I, II, IV, and V) NOT DETECTED NOT DETECTED Final    Comment: Performed at [facility], [place] [place], [place] [address]  C. Diff by PCR, Reflexed     Status: Abnormal   Collection Time: 04/19/22  2:00 AM  Result Value Ref Range Status   Toxigenic C. Difficile by PCR POSITIVE (A) NEGATIVE Final    Comment: Positive for toxigenic C. difficile with little to no toxin production. Only treat if clinical presentation suggests symptomatic illness. Performed at  [facility], [place] [address] [place], [place] [address]          Radiology Studies: CT Abdomen Pelvis Wo Contrast  Result Date: 04/18/2022 CLINICAL DATA:  Abdominal pain, vomiting, diarrhea, fever, rectal temp 101.2 on arrival EXAM: CT ABDOMEN AND PELVIS WITHOUT CONTRAST TECHNIQUE: Multidetector CT imaging of the abdomen and pelvis was performed following the standard protocol without IV contrast. RADIATION DOSE REDUCTION: This exam was performed according to the departmental dose-optimization program which includes automated exposure control, adjustment of the mA and/or kV according to patient size and/or use of iterative reconstruction technique. COMPARISON:  CT pelvis 04/14/2022, CT abdomen and pelvis 04/12/2022 FINDINGS: Lower chest: Peribronchial thickening with minimal bibasilar atelectasis. Large calcified granuloma at base of LEFT lower lobe. Enlargement of cardiac chambers. Hepatobiliary: Gallbladder surgically absent. Liver normal appearance Pancreas: Normal appearance Spleen: Calcified granulomata within spleen, which is otherwise normal. Adrenals/Urinary Tract: Adrenal glands normal appearance. Scattered renal vascular calcifications. No renal mass, hydronephrosis, or hydroureter. No definite urinary tract calcification. Bladder decompressed by Foley catheter. Stomach/Bowel: Appendix not visualized. Diverticulosis of sigmoid colon. Wall thickening and pericolic inflammatory changes at the sigmoid colon over a moderate length, question diverticulitis versus colitis. No extraluminal gas or abscess. Diffuse stranding of pericolic fat extending into sigmoid mesocolon. Stomach and remaining bowel loops unremarkable. Vascular/Lymphatic: Atherosclerotic calcifications aorta, iliac arteries, visceral arteries, coronary arteries. Aorta normal caliber. Significant narrowing of common iliac arteries. No adenopathy. Reproductive: Uterus surgically absent. Ovaries not definitely visualized. Other:  Minimal free fluid in pelvis.  No free air.  No hernia. Musculoskeletal: Osseous demineralization. Degenerative changes lumbar spine and RIGHT hip joint. Superior endplate compression fracture L4 unchanged. IMPRESSION: Sigmoid diverticulosis with wall thickening and pericolic inflammatory changes at the sigmoid colon over a moderate length, question diverticulitis versus colitis. Extensive atherosclerotic disease changes with significant narrowing of common iliac arteries bilaterally. Superior  endplate compression deformity of L4 unchanged. Aortic Atherosclerosis (ICD10-I70.0). Electronically Signed   By: Lavonia Dana M.D.   On: 04/18/2022 14:23   DG Chest Port 1 View  Result Date: 04/18/2022 CLINICAL DATA:  Questionable sepsis, vomiting, diarrhea, fever, abdominal pain, history hypertension EXAM: PORTABLE CHEST 1 VIEW COMPARISON:  Portable exam 1223 hours compared to 02/04/2022 FINDINGS: Enlargement of cardiac silhouette post CABG and AVR. Atherosclerotic calcification aorta. Pulmonary vascularity normal. Mild chronic bronchitic changes with calcified granuloma RIGHT mid lung. Linear scarring LEFT lower lobe. No acute infiltrate, pleural effusion, or pneumothorax. Bones demineralized with BILATERAL glenohumeral degenerative changes and scoliosis. IMPRESSION: Enlargement of cardiac silhouette post CABG and AVR. No acute abnormalities. Aortic Atherosclerosis (ICD10-I70.0). Electronically Signed   By: Lavonia Dana M.D.   On: 04/18/2022 13:01        Scheduled Meds:  allopurinol  100 mg Oral Daily   ALPRAZolam  0.25 mg Oral QHS   amLODipine  10 mg Oral Daily   aspirin EC  81 mg Oral Daily   atorvastatin  80 mg Oral q1800   Chlorhexidine Gluconate Cloth  6 each Topical Daily   cloNIDine  0.2 mg Oral BID   fidaxomicin  200 mg Oral BID   folic acid  1 mg Oral Daily   furosemide  80 mg Oral Daily   And   furosemide  40 mg Oral QPM   hydrALAZINE  100 mg Oral TID   insulin aspart  0-5 Units Subcutaneous  QHS   insulin aspart  0-9 Units Subcutaneous TID WC   lactose free nutrition  237 mL Oral TID WC   metoprolol succinate  12.5 mg Oral Daily   mirabegron ER  25 mg Oral Daily   mycophenolate  500 mg Oral BID   pantoprazole  40 mg Oral Daily   predniSONE  20 mg Oral Q breakfast   spironolactone  25 mg Oral Daily   tadalafil  40 mg Oral QPM   Warfarin - Pharmacist Dosing Inpatient   Does not apply q1600   Continuous Infusions:        [name], MD [facility] 04/20/2022, 7:52 AM

## 2022-04-20 NOTE — Progress Notes (Signed)
ANTICOAGULATION CONSULT NOTE - Follow Up Consult  Pharmacy Consult for warfarin Indication: PAF, mechanical AVR  Allergies  Allergen Reactions   Flagyl [Metronidazole] Rash and Other (See Comments)    ALL-OVER BODY RASH   Ciprofloxacin Nausea Only and Other (See Comments)    Stomach ache   Coreg [Carvedilol] Other (See Comments)    Terrible cramping in the feet and had a lot of bowel movements, but not diarrhea   Diflucan [Fluconazole] Diarrhea   Losartan Swelling and Other (See Comments)    Patient doesn't recall site of swelling   Privigen [Immune Globulin (Human)] Other (See Comments)    Severe/excruciating pain   Sulfamethoxazole-Trimethoprim Nausea Only and Other (See Comments)    Stomach upset   Verapamil Hives   Zetia [Ezetimibe] Other (See Comments)    Reaction not recalled   Zocor [Simvastatin - High Dose] Other (See Comments)    Reaction not recalled   Gatifloxacin Rash and Other (See Comments)    Redness to skin around eye    Patient Measurements: Height: '4\' 11"'$  (149.9 cm) Weight: 59.4 kg (131 lb) IBW/kg (Calculated) : 43.2  Vital Signs: Temp: 98.1 F (36.7 C) (07/13 0501) Temp Source: Oral (07/13 0501) BP: 118/51 (07/13 0501) Pulse Rate: 68 (07/13 0501)  Labs: Recent Labs    04/18/22 1216 04/19/22 0859 04/20/22 0537  HGB 10.0* 9.5* 8.1*  HCT 30.0* 29.3* 25.1*  PLT 130* 119* 118*  APTT 69*  --   --   LABPROT 37.5* 36.8* 32.9*  INR 3.8* 3.8* 3.3*  CREATININE 2.56* 2.19* 2.36*    Estimated Creatinine Clearance: 14.2 mL/min (A) (by C-G formula based on SCr of 2.36 mg/dL (H)).   Medications:  - Warfarin 5 mg tabs: 7.5 mg after supper on Sunday, Wednesday, and Friday. 5 mg on Monday, Tuesday, Thursday, and Saturday.   Assessment:  21 YOWF admitted for persistent diverticulitis. w/ hx of PAF and mechanical AVR on warfarin PTA, CAD (CABG 2005), PAH, CHF, HTN, DM, vasculitis, and autoimmune hemolytic anemia. Baseline INR supratherapeutic.  Pharmacy  consulted to dose warfarin.  Today, 04/20/22 - INR 3.3, supratherapeutic,but notably decreased from yesterday (3.8) - CBC low, but stable - No signs of bleeding per LPN - Recent use of Zosyn may increase warfarin sensitivity, Allopurinol use may also increase warfarin sensitivity, but the patient is maintained on their PTA regimen. - Patient has diet order, has consumed liquid nutritional supplement - Last warfarin dose on 04/17/22 Goal of Therapy:  INR 2-3 Monitor platelets by anticoagulation protocol: Yes   Plan:  - Give warfarin 2.5 mg PO dose tonight (anticipating further drop in INR)  - Daily INR and CBC - Monitor for signs of bleeding/thrombosis  Barnet Pall 04/20/2022,7:23 AM

## 2022-04-20 NOTE — TOC Transition Note (Signed)
Transition of Care [facility]) - CM/SW Discharge Note   Patient Details  Name: MAYRANI KHAMIS MRN: [id number] Date of Birth: [birth year]  Transition of Care [facility]) CM/SW Contact:  Vassie Moselle, LCSW Phone Number: 04/20/2022, 12:03 PM   Clinical Narrative:    Met with pt and confirmed plans to return home with HHPT. Pt was able to express feelings of being overwhelmed and exhausted by her medical care and appointments. Pt agreeable to having HHPT set up. [facility] will provide HHPT services for pt. Pt is to have rollator delivered by [employer].    Final next level of care: Home w Home Health Services Barriers to Discharge: No Barriers Identified   Patient Goals and CMS Choice Patient states their goals for this hospitalization and ongoing recovery are:: To return home   Choice offered to / list presented to : Patient  Discharge Placement                       Discharge Plan and Services                DME Arranged: [place] rolling DME Agency: [employer] Date DME Agency Contacted: 04/20/22     HH Arranged: PT [place] Agency: [facility] Date [place] Agency Contacted: 04/20/22 Time [place]: 1203 Representative spoke with at [place]: [place] Determinants of Health ([place]) Interventions     Readmission Risk Interventions    04/20/2022   12:02 PM  Readmission Risk Prevention Plan  Transportation Screening Complete  Medication Review [employer]) Complete  PCP or Specialist appointment within 3-5 days of discharge Complete  HRI or [place] Complete  SW Recovery Care/Counseling Consult Complete  [place] Not Applicable

## 2022-04-20 NOTE — Evaluation (Signed)
Physical Therapy Evaluation Patient Details Name: [name] MRN: [id number] DOB: August 12, 1941 Today's Date: 04/20/2022  History of Present Illness  anemia, DM2, HLD, HTN, vasculitis, PAF, recent admission from 04/12/2022-04/16/2022 for emphysematous cystitis (colovesical fistula ruled out) treated with IV antibiotics and subsequent discharge on oral Augmentin presented with worsening vomiting and diarrhea with positive fevers. Dx of c-diff.  Clinical Impression  Pt admitted with above diagnosis. Pt ambulated 30' with RW, no loss of balance. She reports she has needed level assistance at home from her children and a friend. She reports no falls in past 6 months.  Pt currently with functional limitations due to the deficits listed below (see PT Problem List). Pt will benefit from skilled PT to increase their independence and safety with mobility to allow discharge to the venue listed below.          Recommendations for follow up therapy are one component of a multi-disciplinary discharge planning process, led by the attending physician.  Recommendations may be updated based on patient status, additional functional criteria and insurance authorization.  Follow Up Recommendations Home health PT      Assistance Recommended at Discharge Intermittent Supervision/Assistance  Patient can return home with the following  Assist for transportation;Assistance with cooking/housework    Equipment Recommendations Rollator (4 wheels)  Recommendations for Other Services       Functional Status Assessment Patient has had a recent decline in their functional status and demonstrates the ability to make significant improvements in function in a reasonable and predictable amount of time.     Precautions / Restrictions Precautions Precautions: Fall Restrictions Weight Bearing Restrictions: No      Mobility  Bed Mobility Overal bed mobility: Modified Independent Bed Mobility: Supine to Sit     Supine  to sit: Modified independent (Device/Increase time)     General bed mobility comments: used bedrail, HOB up    Transfers Overall transfer level: Needs assistance Equipment used: Rolling walker (2 wheels) Transfers: Sit to/from Stand Sit to Stand: Min guard           General transfer comment: extra time to set up/position and complete rise.    Ambulation/Gait Ambulation/Gait assistance: Min guard Gait Distance (Feet): 30 Feet Assistive device: Rolling walker (2 wheels) Gait Pattern/deviations: Step-through pattern, Decreased stride length Gait velocity: decr     General Gait Details: steady with RW, no loss of balance, pt likely could walk farther but she wanted to finish her breakfast  Stairs            Wheelchair Mobility    Modified Rankin (Stroke Patients Only)       Balance Overall balance assessment: Needs assistance Sitting-balance support: Feet supported Sitting balance-Leahy Scale: Good     Standing balance support: Reliant on assistive device for balance, During functional activity, Bilateral upper extremity supported Standing balance-Leahy Scale: Fair                               Pertinent Vitals/Pain Pain Assessment Pain Assessment: Faces Faces Pain Scale: Hurts little more Pain Location: abdomen Pain Descriptors / Indicators: Aching, Discomfort Pain Intervention(s): Premedicated before session, Monitored during session, Limited activity within patient's tolerance    Home Living Family/patient expects to be discharged to:: Private residence Living Arrangements: Alone Available Help at Discharge: Family;Available 24 hours/day Type of Home: House Home Access: Stairs to enter Entrance Stairs-Rails: Psychiatric nurse of Steps: 3   Home Layout: One  level Home Equipment: Conservation officer, nature (2 wheels);Cane - single point;Shower seat;Crutches;Grab bars - toilet;Wheelchair - manual Additional Comments: children and  friend able to assist prn    Prior Function Prior Level of Function : Independent/Modified Independent             Mobility Comments: walks with RW or cane, no falls in past 6 months ADLs Comments: independent with ADL, family assists with grocery shopping, pt reports she was independent with cooking     Hand Dominance   Dominant Hand: Right    Extremity/Trunk Assessment   Upper Extremity Assessment Upper Extremity Assessment: Defer to OT evaluation    Lower Extremity Assessment Lower Extremity Assessment: RLE deficits/detail;LLE deficits/detail RLE Deficits / Details: edema noted BLEs (seems chronic per chart) RLE Sensation: WNL LLE Deficits / Details: edema noted BLEs (chronic per chart) LLE Sensation: WNL    Cervical / Trunk Assessment Cervical / Trunk Assessment: Normal  Communication   Communication: No difficulties  Cognition Arousal/Alertness: Awake/alert Behavior During Therapy: WFL for tasks assessed/performed Overall Cognitive Status: Within Functional Limits for tasks assessed                                          General Comments      Exercises     Assessment/Plan    PT Assessment Patient needs continued PT services  PT Problem List Decreased strength;Decreased activity tolerance;Decreased balance;Decreased mobility;Decreased knowledge of precautions;Decreased safety awareness       PT Treatment Interventions DME instruction;Gait training;Stair training;Functional mobility training;Therapeutic activities;Therapeutic exercise;Balance training;Patient/family education    PT Goals (Current goals can be found in the Care Plan section)  Acute Rehab PT Goals Patient Stated Goal: get home PT Goal Formulation: With patient Time For Goal Achievement: 05/04/22 Potential to Achieve Goals: Good    Frequency Min 3X/week     Co-evaluation               AM-PAC PT "6 Clicks" Mobility  Outcome Measure Help needed turning from  your back to your side while in a flat bed without using bedrails?: None Help needed moving from lying on your back to sitting on the side of a flat bed without using bedrails?: A Little Help needed moving to and from a bed to a chair (including a wheelchair)?: None Help needed standing up from a chair using your arms (e.g., wheelchair or bedside chair)?: None Help needed to walk in hospital room?: A Little Help needed climbing 3-5 steps with a railing? : A Little 6 Click Score: 21    End of Session Equipment Utilized During Treatment: Gait belt Activity Tolerance: Patient tolerated treatment well Patient left: in chair;with call bell/phone within reach Nurse Communication: Mobility status PT Visit Diagnosis: Unsteadiness on feet (R26.81);Difficulty in walking, not elsewhere classified (R26.2);Muscle weakness (generalized) (M62.81)    Time: 7353-2992 PT Time Calculation (min) (ACUTE ONLY): 16 min   Charges:   PT Evaluation $PT Eval Moderate Complexity: 1 Mod          Philomena Doheny PT 04/20/2022  Acute Rehabilitation Services  Office [phone]

## 2022-04-21 ENCOUNTER — Inpatient Hospital Stay: Payer: Medicare HMO

## 2022-04-21 ENCOUNTER — Ambulatory Visit: Payer: Self-pay

## 2022-04-21 ENCOUNTER — Inpatient Hospital Stay: Payer: Medicare HMO | Admitting: Adult Health

## 2022-04-21 DIAGNOSIS — Z7901 Long term (current) use of anticoagulants: Secondary | ICD-10-CM | POA: Diagnosis not present

## 2022-04-21 DIAGNOSIS — D591 Autoimmune hemolytic anemia, unspecified: Secondary | ICD-10-CM | POA: Diagnosis not present

## 2022-04-21 DIAGNOSIS — A0472 Enterocolitis due to Clostridium difficile, not specified as recurrent: Secondary | ICD-10-CM

## 2022-04-21 DIAGNOSIS — I5032 Chronic diastolic (congestive) heart failure: Secondary | ICD-10-CM | POA: Diagnosis not present

## 2022-04-21 LAB — HEMOGLOBIN AND HEMATOCRIT, BLOOD
HCT: 28.3 % — ABNORMAL LOW (ref 36.0–46.0)
Hemoglobin: 9.4 g/dL — ABNORMAL LOW (ref 12.0–15.0)

## 2022-04-21 LAB — CBC WITH DIFFERENTIAL/PLATELET
Abs Immature Granulocytes: 0.03 10*3/uL (ref 0.00–0.07)
Basophils Absolute: 0 10*3/uL (ref 0.0–0.1)
Basophils Relative: 0 %
Eosinophils Absolute: 0 10*3/uL (ref 0.0–0.5)
Eosinophils Relative: 0 %
HCT: 23.9 % — ABNORMAL LOW (ref 36.0–46.0)
Hemoglobin: 7.7 g/dL — ABNORMAL LOW (ref 12.0–15.0)
Immature Granulocytes: 1 %
Lymphocytes Relative: 9 %
Lymphs Abs: 0.4 10*3/uL — ABNORMAL LOW (ref 0.7–4.0)
MCH: 30.4 pg (ref 26.0–34.0)
MCHC: 32.2 g/dL (ref 30.0–36.0)
MCV: 94.5 fL (ref 80.0–100.0)
Monocytes Absolute: 0.4 10*3/uL (ref 0.1–1.0)
Monocytes Relative: 7 %
Neutro Abs: 4.2 10*3/uL (ref 1.7–7.7)
Neutrophils Relative %: 83 %
Platelets: 116 10*3/uL — ABNORMAL LOW (ref 150–400)
RBC: 2.53 MIL/uL — ABNORMAL LOW (ref 3.87–5.11)
RDW: 16.5 % — ABNORMAL HIGH (ref 11.5–15.5)
WBC: 5.1 10*3/uL (ref 4.0–10.5)
nRBC: 0 % (ref 0.0–0.2)

## 2022-04-21 LAB — PREPARE RBC (CROSSMATCH)

## 2022-04-21 LAB — BASIC METABOLIC PANEL
Anion gap: 8 (ref 5–15)
BUN: 75 mg/dL — ABNORMAL HIGH (ref 8–23)
CO2: 20 mmol/L — ABNORMAL LOW (ref 22–32)
Calcium: 8 mg/dL — ABNORMAL LOW (ref 8.9–10.3)
Chloride: 106 mmol/L (ref 98–111)
Creatinine, Ser: 2.49 mg/dL — ABNORMAL HIGH (ref 0.44–1.00)
GFR, Estimated: 19 mL/min — ABNORMAL LOW (ref >60)
Glucose, Bld: 276 mg/dL — ABNORMAL HIGH (ref 70–99)
Potassium: 4.3 mmol/L (ref 3.5–5.1)
Sodium: 134 mmol/L — ABNORMAL LOW (ref 135–145)

## 2022-04-21 LAB — GLUCOSE, CAPILLARY
Glucose-Capillary: 231 mg/dL — ABNORMAL HIGH (ref 70–99)
Glucose-Capillary: 268 mg/dL — ABNORMAL HIGH (ref 70–99)
Glucose-Capillary: 312 mg/dL — ABNORMAL HIGH (ref 70–99)

## 2022-04-21 LAB — MAGNESIUM: Magnesium: 2.1 mg/dL (ref 1.7–2.4)

## 2022-04-21 LAB — PROTIME-INR
INR: 1.6 — ABNORMAL HIGH (ref 0.8–1.2)
Prothrombin Time: 18.4 seconds — ABNORMAL HIGH (ref 11.4–15.2)

## 2022-04-21 MED ORDER — INSULIN GLARGINE-YFGN 100 UNIT/ML ~~LOC~~ SOLN
9.0000 [IU] | Freq: Every day | SUBCUTANEOUS | Status: DC
Start: 2022-04-21 — End: 2022-04-24
  Administered 2022-04-21 – 2022-04-24 (×4): 9 [IU] via SUBCUTANEOUS
  Filled 2022-04-21 (×4): qty 0.09

## 2022-04-21 MED ORDER — ALPRAZOLAM 0.25 MG PO TABS
0.2500 mg | ORAL_TABLET | Freq: Once | ORAL | Status: AC
Start: 2022-04-21 — End: 2022-04-21
  Administered 2022-04-21: 0.25 mg via ORAL
  Filled 2022-04-21: qty 1

## 2022-04-21 MED ORDER — SODIUM CHLORIDE 0.9% IV SOLUTION
Freq: Once | INTRAVENOUS | Status: AC
Start: 2022-04-21 — End: 2022-04-21

## 2022-04-21 NOTE — Progress Notes (Signed)
ANTICOAGULATION CONSULT NOTE - Follow Up Consult  Pharmacy Consult for warfarin Indication: PAF, mechanical AVR  Allergies  Allergen Reactions   Flagyl [Metronidazole] Rash and Other (See Comments)    ALL-OVER BODY RASH   Ciprofloxacin Nausea Only and Other (See Comments)    Stomach ache   Coreg [Carvedilol] Other (See Comments)    Terrible cramping in the feet and had a lot of bowel movements, but not diarrhea   Diflucan [Fluconazole] Diarrhea   Losartan Swelling and Other (See Comments)    Patient doesn't recall site of swelling   Privigen [Immune Globulin (Human)] Other (See Comments)    Severe/excruciating pain   Sulfamethoxazole-Trimethoprim Nausea Only and Other (See Comments)    Stomach upset   Verapamil Hives   Zetia [Ezetimibe] Other (See Comments)    Reaction not recalled   Zocor [Simvastatin - High Dose] Other (See Comments)    Reaction not recalled   Gatifloxacin Rash and Other (See Comments)    Redness to skin around eye    Patient Measurements: Height: '4\' 11"'$  (149.9 cm) Weight: 59.4 kg (131 lb) IBW/kg (Calculated) : 43.2   Vital Signs: Temp: 97.3 F (36.3 C) (07/14 0616) Temp Source: Oral (07/14 0616) BP: 136/42 (07/14 0616) Pulse Rate: 64 (07/14 0616)  Labs: Recent Labs    04/18/22 1216 04/19/22 0859 04/20/22 0537 04/21/22 0534  HGB 10.0* 9.5* 8.1* 7.7*  HCT 30.0* 29.3* 25.1* 23.9*  PLT 130* 119* 118* 116*  APTT 69*  --   --   --   LABPROT 37.5* 36.8* 32.9* 18.4*  INR 3.8* 3.8* 3.3* 1.6*  CREATININE 2.56* 2.19* 2.36* 2.49*    Estimated Creatinine Clearance: 13.4 mL/min (A) (by C-G formula based on SCr of 2.49 mg/dL (H)).   Medications:  - Warfarin 5 mg tabs: 7.5 mg after supper on Sunday, Wednesday, and Friday. 5 mg on Monday, Tuesday, Thursday, and Saturday.   Assessment: 35 YOWF admitted for persistent diverticulitis. w/ hx of PAF and mechanical AVR on warfarin PTA, CAD (CABG 2005), PAH, CHF, HTN, DM, vasculitis, and autoimmune  hemolytic anemia. Baseline INR supratherapeutic.  Pharmacy consulted to dose warfarin.  Today, 04/21/22 - INR today 1.6, subtherapeutic, notable decrease from yesterday (3.3)  - CBC low, but stable, PRBC 1 unit transfusion planned - 2 instances of rectal bleed today - Allopurinol use may increase warfarin sensitivity, but patient is maintained on PTA regimen - Patient has diet order, is consuming 75% of meals - Last warfarin dose on 04/17/22, patient refused dose on 04/20/22  Goal of Therapy:  INR 2-3 Monitor platelets by anticoagulation protocol: Yes   Plan:  - discussed w/ MD, will hold warfarin today d/t rectal bleeding - Daily INR and CBC - Monitor for signs of bleeding/thrombosis - Defer further anticoagulation today d/t bleeding. Consider bridging w/ parenteral anticoagulant, if bleeding subsides and INR remains subtherapeutic.   Barnet Pall 04/21/2022,10:15 AM

## 2022-04-21 NOTE — Progress Notes (Signed)
PROGRESS NOTE    [name]  NUU:[id number] DOB: 1940/12/21 DOA: 04/18/2022 PCP: [name], MD   Brief Narrative:  81 y.o. female with medical history significant of chronic diastolic HF, autoimmune hemolytic anemia, DM2, HLD, HTN, vasculitis, PAF, recent admission from 04/12/2022-04/16/2022 for emphysematous cystitis (colovesical fistula ruled out) treated with IV antibiotics and subsequent discharge on oral Augmentin presented with worsening vomiting and diarrhea with positive fevers.  On presentation, creatinine was 2.56 with WBC of 11.7.  CT of abdomen and pelvis showed possible sigmoid diverticulitis versus colitis.  She was started on broad-spectrum antibiotics.  She subsequently tested positive for C. difficile and was started on Dificid; antibiotics were discontinued.  Assessment & Plan:   Possible C. difficile colitis/diverticulitis -Stool for C. difficile tested positive.  Imaging as above.  Broad-spectrum antibiotics discontinued on 04/19/2022.  Started Dificid on 04/19/2022: Finish 10-day course of therapy.  CKD stage IV -Creatinine at baseline.  Monitor  Leukocytosis -Resolved  Thrombocytopenia -Chronic.  Questionable cause.  Platelets stable.  No signs of bleeding  Chronic diastolic CHF PAF on Coumadin PAH Supratherapeutic INR -Strict input and output.  Daily weights.  Fluid restriction.  Continue current regimen of Lasix along with hydralazine, Toprol-XL, spironolactone.  INR 1.6 today.  Repeat a.m. INR.  Patient had refused Coumadin dose on 04/20/2022 evening because of concern for drop in hemoglobin.  We will hold Coumadin today as well because of slight drop in hemoglobin and some rectal bleeding.  Outpatient follow-up with cardiology. -Continue tadalafil  History of autoimmune hemolytic anemia History of vasculitis Anemia of chronic disease -Hemoglobin 7.7 this morning.  Hematology had recommended in the past to transfuse if hemoglobin is less than 8.  Will  transfuse 1 unit of packed red cells.  Had some rectal bleeding this morning.  Monitor H&H.  No evidence of hemolysis.  Outpatient follow-up with oncology.  Continue prednisone.  Discussed with Dr. Iruku/hematology via secure chat who recommended to hold mycophenolate while patient is getting treatment for C. difficile and to continue prednisone.  Recent emphysematous cystitis Acute urinary retention -CT on admission did not show evidence of cystitis.  DC'd antibiotics on 04/19/2022.  Currently has a Foley catheter and was supposed to follow-up with urology on 04/20/2022 for possible removal.  Communicated with Dr. Junious Silk on 04/20/2022 via secure chat who recommended to keep the Foley catheter in place and patient has follow-up appointment with Dr. Matilde Sprang on 04/27/2022 at 9:30 AM.  Hypertension -continue current regimen.  Physical deconditioning -PT recommends home health PT  Moderate malnutrition -Follow nutrition recommendations  Goals of care -Discussed goals of care and she is agreeable for DNR.  DVT prophylaxis: Coumadin will be held today as well Code Status: We will switch CODE STATUS to DNR Family Communication: None at bedside Disposition Plan: Status is: Inpatient Remains inpatient appropriate because: Of severity of illness  Consultants: Contacted urology via secure chat on 04/20/2022.  Communicated with hematology/Dr. Chryl Heck via secure chat on 04/21/2022 Procedures: None  Antimicrobials:  Anti-infectives (From admission, onward)    Start     Dose/Rate Route Frequency Ordered Stop   04/19/22 1000  fidaxomicin (DIFICID) tablet 200 mg        200 mg Oral 2 times daily 04/19/22 0810 04/29/22 0959   04/18/22 2200  piperacillin-tazobactam (ZOSYN) IVPB 3.375 g  Status:  Discontinued        3.375 g 12.5 mL/hr over 240 Minutes Intravenous Every 12 hours 04/18/22 1528 04/19/22 0823   04/18/22 1530  piperacillin-tazobactam (ZOSYN) IVPB 3.375 g  Status:  Discontinued        3.375  g 100 mL/hr over 30 Minutes Intravenous  Once 04/18/22 1528 04/18/22 1544   04/18/22 1445  piperacillin-tazobactam (ZOSYN) IVPB 3.375 g        3.375 g 100 mL/hr over 30 Minutes Intravenous  Once 04/18/22 1431 04/18/22 1518        Subjective: Patient seen and examined at bedside.  Concerned about her drop in hemoglobin.  Had small bloody bowel movement earlier this morning as per nursing staff.  No overnight fever, chest pain, worsening shortness of breath reported.   Objective: Vitals:   04/20/22 0501 04/20/22 1430 04/20/22 2029 04/21/22 0616  BP: (!) 118/51 (!) 113/51 118/64 (!) 136/42  Pulse: 68 62 71 64  Resp: '15 16 18 17  '$ Temp: 98.1 F (36.7 C) 97.6 F (36.4 C) 98 F (36.7 C) (!) 97.3 F (36.3 C)  TempSrc: Oral Oral Oral Oral  SpO2: 97% 99% 100% 97%  Weight:      Height:        Intake/Output Summary (Last 24 hours) at 04/21/2022 0814 Last data filed at 04/21/2022 0500 Gross per 24 hour  Intake 720 ml  Output 800 ml  Net -80 ml    Filed Weights   04/18/22 1206  Weight: 59.4 kg    Examination:  General: No acute distress.  Still on room air.  Chronically ill and deconditioned looking ENT/neck: No palpable neck masses or JVD elevation noted  respiratory: Bilateral decreased breath sounds at bases with basilar crackles CVS: Rate is mostly controlled; S1 and S2 are heard  abdominal: Soft, nontender, distended mildly; no organomegaly, bowel sounds are heard normally  extremities: Lower extremity edema bilaterally present; no clubbing noted CNS: Alert and oriented.  No focal neurologic deficit.  Moving extremities Lymph: No palpable cervical lymphadenopathy Skin: No obvious petechiae/rashes psych: Affect is flat.  Not showing signs of any agitation  musculoskeletal: No obvious joint swelling/deformity     Data Reviewed: I have personally reviewed following labs and imaging studies  CBC: Recent Labs  Lab 04/17/22 1241 04/18/22 1216 04/19/22 0859  04/20/22 0537 04/21/22 0534  WBC 10.7* 11.7* 9.0 6.7 5.1  NEUTROABS 9.9* 10.5*  --  5.6 4.2  HGB 9.8* 10.0* 9.5* 8.1* 7.7*  HCT 28.8* 30.0* 29.3* 25.1* 23.9*  MCV 90.9 92.0 93.3 93.7 94.5  PLT 122* 130* 119* 118* 116*    Basic Metabolic Panel: Recent Labs  Lab 04/17/22 1241 04/18/22 1216 04/19/22 0859 04/20/22 0537 04/21/22 0534  NA 134* 137 141 135 134*  K 3.7 3.8 3.7 4.1 4.3  CL 104 108 111 108 106  CO2 20* 20* 18* 19* 20*  GLUCOSE 119* 96 65* 204* 276*  BUN 82* 80* 64* 65* 75*  CREATININE 2.40* 2.56* 2.19* 2.36* 2.49*  CALCIUM 7.6* 7.9* 7.9* 7.6* 8.0*  MG  --   --   --  2.1 2.1    GFR: Estimated Creatinine Clearance: 13.4 mL/min (A) (by C-G formula based on SCr of 2.49 mg/dL (H)). Liver Function Tests: Recent Labs  Lab 04/17/22 1241 04/18/22 1216 04/19/22 0859 04/20/22 0537  AST '28 31 29 24  '$ ALT '22 22 20 17  '$ ALKPHOS 47 53 55 48  BILITOT 0.7 0.8 1.0 0.4  PROT 5.2* 5.3* 5.1* 4.5*  ALBUMIN 2.9* 2.9* 2.6* 2.3*    No results for input(s): "LIPASE", "AMYLASE" in the last 168 hours. No results for input(s): "AMMONIA" in the last  168 hours. Coagulation Profile: Recent Labs  Lab 04/16/22 0343 04/18/22 1216 04/19/22 0859 04/20/22 0537 04/21/22 0534  INR 2.9* 3.8* 3.8* 3.3* 1.6*    Cardiac Enzymes: No results for input(s): "CKTOTAL", "CKMB", "CKMBINDEX", "TROPONINI" in the last 168 hours. BNP (last 3 results) No results for input(s): "PROBNP" in the last 8760 hours. HbA1C: No results for input(s): "HGBA1C" in the last 72 hours. CBG: Recent Labs  Lab 04/20/22 0903 04/20/22 1201 04/20/22 1627 04/20/22 2113 04/21/22 0757  GLUCAP 166* 295* 285* 338* 231*    Lipid Profile: No results for input(s): "CHOL", "HDL", "LDLCALC", "TRIG", "CHOLHDL", "LDLDIRECT" in the last 72 hours. Thyroid Function Tests: No results for input(s): "TSH", "T4TOTAL", "FREET4", "T3FREE", "THYROIDAB" in the last 72 hours. Anemia Panel: No results for input(s): "VITAMINB12",  "FOLATE", "FERRITIN", "TIBC", "IRON", "RETICCTPCT" in the last 72 hours. Sepsis Labs: Recent Labs  Lab 04/18/22 1216 04/18/22 1415  LATICACIDVEN 1.0 1.0     Recent Results (from the past 240 hour(s))  Urine Culture     Status: None   Collection Time: 04/14/22  1:29 PM   Specimen: Urine, Catheterized  Result Value Ref Range Status   Specimen Description   Final    URINE, CATHETERIZED Performed at [facility] [address] [place], [place] [address]    Special Requests   Final    NONE Performed at [facility], [place] [address] [place], [place] [address]    Culture   Final    NO GROWTH Performed at [facility], [place] [address], [place], [place] [address]    Report Status 04/15/2022 FINAL  Final  Culture, blood (Routine x 2)     Status: None (Preliminary result)   Collection Time: 04/18/22 12:16 PM   Specimen: BLOOD  Result Value Ref Range Status   Specimen Description   Final    BLOOD RIGHT ANTECUBITAL Performed at [facility] [address] [place], [place] [address]    Special Requests   Final    BOTTLES DRAWN AEROBIC AND ANAEROBIC Blood Culture results may not be optimal due to an excessive volume of blood received in culture bottles Performed at [facility] [address] [place], [place] [address]    Culture   Final    NO GROWTH 3 DAYS Performed at [facility], [place] [address], [place], [place] [address]    Report Status PENDING  Incomplete  Culture, blood (Routine x 2)     Status: None (Preliminary result)   Collection Time: 04/18/22 12:26 PM   Specimen: BLOOD  Result Value Ref Range Status   Specimen Description   Final    BLOOD LEFT ANTECUBITAL Performed at [facility] [address] [place], [place] [address]    Special Requests   Final    BOTTLES DRAWN AEROBIC AND ANAEROBIC Blood Culture adequate volume Performed at [facility] [address], [place], [place] [address]    Culture   Final    NO GROWTH 3 DAYS Performed at [facility], [place] [address], [place], [place] [address]    Report Status PENDING  Incomplete  Urine Culture     Status: None   Collection Time: 04/18/22 12:29 PM   Specimen: In/Out Cath Urine  Result Value Ref Range Status   Specimen Description   Final    IN/OUT CATH URINE Performed at [facility], [place] [address] [place], [place]  [address]    Special Requests   Final    NONE Performed at [facility], [place] [address], [place], [place] [address]    Culture   Final    NO GROWTH Performed at [facility], [place] [address], [place], [place] [address]    Report Status 04/19/2022 FINAL  Final  C Difficile Quick Screen w PCR reflex     Status: Abnormal   Collection Time: 04/19/22  2:00 AM   Specimen: STOOL  Result Value Ref Range Status   C Diff antigen POSITIVE (A) NEGATIVE Final   C Diff toxin NEGATIVE NEGATIVE Final   C Diff interpretation Results are indeterminate. See PCR results.  Final    Comment: Performed at [facility], [place] [address], [place], [place] [address]  Gastrointestinal Panel by PCR , Stool     Status: None   Collection Time: 04/19/22  2:00 AM   Specimen: Stool  Result Value Ref Range Status   Campylobacter species NOT DETECTED NOT DETECTED Final   Plesimonas shigelloides NOT DETECTED NOT DETECTED Final   Salmonella species NOT DETECTED NOT DETECTED Final   Yersinia enterocolitica NOT DETECTED NOT DETECTED Final   Vibrio species NOT DETECTED NOT DETECTED Final   Vibrio cholerae NOT DETECTED NOT DETECTED Final   Enteroaggregative E coli (EAEC) NOT DETECTED NOT DETECTED Final   Enteropathogenic E coli (EPEC) NOT DETECTED NOT DETECTED Final   Enterotoxigenic E coli (ETEC) NOT DETECTED NOT DETECTED Final   Shiga like toxin producing E coli (STEC) NOT DETECTED NOT DETECTED Final    Shigella/Enteroinvasive E coli (EIEC) NOT DETECTED NOT DETECTED Final   Cryptosporidium NOT DETECTED NOT DETECTED Final   Cyclospora cayetanensis NOT DETECTED NOT DETECTED Final   Entamoeba histolytica NOT DETECTED NOT DETECTED Final   Giardia lamblia NOT DETECTED NOT DETECTED Final   Adenovirus F40/41 NOT DETECTED NOT DETECTED Final   Astrovirus NOT DETECTED NOT DETECTED Final   Norovirus GI/GII NOT DETECTED NOT DETECTED Final   Rotavirus A NOT DETECTED NOT DETECTED Final   Sapovirus (I, II, IV, and V) NOT DETECTED NOT DETECTED Final    Comment: Performed at [facility], [address] [place], [place] [address]  C. Diff by PCR, Reflexed     Status: Abnormal   Collection Time: 04/19/22  2:00 AM  Result Value Ref Range Status   Toxigenic C. Difficile by PCR POSITIVE (A) NEGATIVE Final    Comment: Positive for toxigenic C. difficile with little to no toxin production. Only treat if clinical presentation suggests symptomatic illness. Performed at [facility], [place] [address], [place], [place] [address]          Radiology Studies: No results found.      Scheduled Meds:  sodium chloride   Intravenous Once   allopurinol  100 mg Oral Daily   ALPRAZolam  0.25 mg Oral QHS   amLODipine  10 mg Oral Daily   aspirin EC  81 mg Oral Daily   atorvastatin  80 mg Oral q1800   Chlorhexidine Gluconate Cloth  6 each Topical Daily   cloNIDine  0.2 mg Oral BID   fidaxomicin  200 mg Oral BID   folic acid  1 mg Oral Daily   furosemide  80 mg Oral Daily   And   furosemide  40 mg Oral QPM   hydrALAZINE  100 mg Oral TID   insulin aspart  0-5 Units Subcutaneous QHS   insulin aspart  0-9 Units Subcutaneous TID WC   lactose free nutrition  237 mL Oral TID WC   metoprolol succinate  12.5 mg Oral Daily   mirabegron ER  25 mg Oral Daily   mycophenolate  500 mg Oral BID   pantoprazole  40 mg Oral Daily   predniSONE  20 mg Oral Q breakfast   spironolactone  25 mg Oral Daily    tadalafil  40 mg Oral QPM   warfarin  2.5 mg Oral ONCE-1600   Warfarin - Pharmacist Dosing Inpatient   Does not apply q1600   Continuous Infusions:        [name], MD [facility] 04/21/2022, 8:14 AM

## 2022-04-21 NOTE — Inpatient Diabetes Management (Signed)
Inpatient Diabetes Program Recommendations  AACE/ADA: New Consensus Statement on Inpatient Glycemic Control (2015)  Target Ranges:  Prepandial:   less than 140 mg/dL      Peak postprandial:   less than 180 mg/dL (1-2 hours)      Critically ill patients:  140 - 180 mg/dL    Latest Reference Range & Units 04/20/22 09:03 04/20/22 12:01 04/20/22 16:27 04/20/22 21:13  Glucose-Capillary 70 - 99 mg/dL 166 (H)  2 units Novolog  295 (H)  5 units Novolog  285 (H)  5 units Novolog '@1801'$  338 (H)  4 units Novolog   (H): Data is abnormally high  Latest Reference Range & Units 04/21/22 07:57  Glucose-Capillary 70 - 99 mg/dL 231 (H)  (H): Data is abnormally high   Home DM Meds: Glipizide 5 mg daily        Novolog 2-8 units TID per SSI        Tresiba 9-14 units QHS          Current Orders: Novolog Sensitive Correction Scale/ SSI (0-9 units) TID AC + HS     MD- Note pt getting Prednisone 20 mg daily.  Takes Tyler Aas and Novolog Insulin at home.  Please consider:  1. Start Semglee 9 units Daily (0.15 units/kg)  2. Start Novolog Meal Coverage: Novolog 4 units TID with meals  HOLD if pt eats <50% meals    --Will follow patient during hospitalization--  Wyn Quaker RN, MSN, CDE Diabetes Coordinator Inpatient Glycemic Control Team Team Pager: [phone] (8a-5p)

## 2022-04-22 DIAGNOSIS — I5032 Chronic diastolic (congestive) heart failure: Secondary | ICD-10-CM | POA: Diagnosis not present

## 2022-04-22 DIAGNOSIS — D591 Autoimmune hemolytic anemia, unspecified: Secondary | ICD-10-CM | POA: Diagnosis not present

## 2022-04-22 DIAGNOSIS — I2721 Secondary pulmonary arterial hypertension: Secondary | ICD-10-CM

## 2022-04-22 DIAGNOSIS — Z7901 Long term (current) use of anticoagulants: Secondary | ICD-10-CM | POA: Diagnosis not present

## 2022-04-22 DIAGNOSIS — A0472 Enterocolitis due to Clostridium difficile, not specified as recurrent: Secondary | ICD-10-CM | POA: Diagnosis not present

## 2022-04-22 LAB — CBC WITH DIFFERENTIAL/PLATELET
Abs Immature Granulocytes: 0.03 10*3/uL (ref 0.00–0.07)
Basophils Absolute: 0 10*3/uL (ref 0.0–0.1)
Basophils Relative: 0 %
Eosinophils Absolute: 0 10*3/uL (ref 0.0–0.5)
Eosinophils Relative: 1 %
HCT: 27.9 % — ABNORMAL LOW (ref 36.0–46.0)
Hemoglobin: 9.1 g/dL — ABNORMAL LOW (ref 12.0–15.0)
Immature Granulocytes: 1 %
Lymphocytes Relative: 12 %
Lymphs Abs: 0.5 10*3/uL — ABNORMAL LOW (ref 0.7–4.0)
MCH: 29.7 pg (ref 26.0–34.0)
MCHC: 32.6 g/dL (ref 30.0–36.0)
MCV: 91.2 fL (ref 80.0–100.0)
Monocytes Absolute: 0.4 10*3/uL (ref 0.1–1.0)
Monocytes Relative: 10 %
Neutro Abs: 3.4 10*3/uL (ref 1.7–7.7)
Neutrophils Relative %: 76 %
Platelets: 121 10*3/uL — ABNORMAL LOW (ref 150–400)
RBC: 3.06 MIL/uL — ABNORMAL LOW (ref 3.87–5.11)
RDW: 16.7 % — ABNORMAL HIGH (ref 11.5–15.5)
WBC: 4.4 10*3/uL (ref 4.0–10.5)
nRBC: 0 % (ref 0.0–0.2)

## 2022-04-22 LAB — BASIC METABOLIC PANEL
Anion gap: 8 (ref 5–15)
BUN: 73 mg/dL — ABNORMAL HIGH (ref 8–23)
CO2: 22 mmol/L (ref 22–32)
Calcium: 8.2 mg/dL — ABNORMAL LOW (ref 8.9–10.3)
Chloride: 105 mmol/L (ref 98–111)
Creatinine, Ser: 2.31 mg/dL — ABNORMAL HIGH (ref 0.44–1.00)
GFR, Estimated: 20 mL/min — ABNORMAL LOW (ref >60)
Glucose, Bld: 162 mg/dL — ABNORMAL HIGH (ref 70–99)
Potassium: 4.5 mmol/L (ref 3.5–5.1)
Sodium: 135 mmol/L (ref 135–145)

## 2022-04-22 LAB — TYPE AND SCREEN
ABO/RH(D): A POS
Antibody Screen: NEGATIVE
Unit division: 0

## 2022-04-22 LAB — GLUCOSE, CAPILLARY
Glucose-Capillary: 161 mg/dL — ABNORMAL HIGH (ref 70–99)
Glucose-Capillary: 190 mg/dL — ABNORMAL HIGH (ref 70–99)
Glucose-Capillary: 327 mg/dL — ABNORMAL HIGH (ref 70–99)
Glucose-Capillary: 290 mg/dL — ABNORMAL HIGH (ref 70–99)

## 2022-04-22 LAB — PROTIME-INR
INR: 1 (ref 0.8–1.2)
Prothrombin Time: 13.3 seconds (ref 11.4–15.2)

## 2022-04-22 LAB — BPAM RBC
Blood Product Expiration Date: 202307292359
ISSUE DATE / TIME: 202307141031
Unit Type and Rh: 6200

## 2022-04-22 LAB — MAGNESIUM: Magnesium: 2.1 mg/dL (ref 1.7–2.4)

## 2022-04-22 LAB — HEPARIN LEVEL (UNFRACTIONATED): Heparin Unfractionated: 0.31 IU/mL (ref 0.30–0.70)

## 2022-04-22 MED ORDER — HEPARIN (PORCINE) 25000 UT/250ML-% IV SOLN
900.0000 [IU]/h | INTRAVENOUS | Status: DC
  Administered 2022-04-22: 850 [IU]/h via INTRAVENOUS
  Administered 2022-04-23 – 2022-04-27 (×4): 900 [IU]/h via INTRAVENOUS
  Filled 2022-04-22 (×6): qty 250

## 2022-04-22 MED ORDER — WARFARIN SODIUM 2.5 MG PO TABS
7.5000 mg | ORAL_TABLET | Freq: Once | ORAL | Status: AC
Start: 2022-04-22 — End: 2022-04-22
  Administered 2022-04-22: 7.5 mg via ORAL
  Filled 2022-04-22: qty 1

## 2022-04-22 NOTE — Progress Notes (Signed)
PROGRESS NOTE    [name]  UUV:[id number] DOB: Jun 08, 1941 DOA: 04/18/2022 PCP: [name], MD   Brief Narrative:  81 y.o. female with medical history significant of chronic diastolic HF, autoimmune hemolytic anemia, DM2, HLD, HTN, vasculitis, PAF, recent admission from 04/12/2022-04/16/2022 for emphysematous cystitis (colovesical fistula ruled out) treated with IV antibiotics and subsequent discharge on oral Augmentin presented with worsening vomiting and diarrhea with positive fevers.  On presentation, creatinine was 2.56 with WBC of 11.7.  CT of abdomen and pelvis showed possible sigmoid diverticulitis versus colitis.  She was started on broad-spectrum antibiotics.  She subsequently tested positive for C. difficile and was started on Dificid; antibiotics were discontinued.  Assessment & Plan:   Possible C. difficile colitis/diverticulitis -Stool for C. difficile tested positive.  Imaging as above.  Broad-spectrum antibiotics discontinued on 04/19/2022.  Started Dificid on 04/19/2022: Finish 10-day course of therapy.  Diarrhea has improved.  CKD stage IV -Creatinine at baseline.  Monitor  Leukocytosis -Resolved  Thrombocytopenia -Chronic.  Questionable cause.  Platelets stable.  No signs of bleeding  Chronic diastolic CHF PAF on Coumadin PAH Supratherapeutic INR -Strict input and output.  Daily weights.  Fluid restriction.  Continue current regimen of Lasix along with hydralazine, Toprol-XL, spironolactone.  INR 1 today.  Repeat a.m. INR.  Resume Coumadin today as no further episodes of rectal bleeding noted.  We will also bridge with heparin.  Outpatient follow-up with cardiology. -Continue tadalafil  History of autoimmune hemolytic anemia History of vasculitis Anemia of chronic disease -Hematology had recommended in the past to transfuse if hemoglobin is less than 8.  Status post 1 unit packed red cell transfusion on 04/21/2022.  Hemoglobin 9.1 today.  Monitor H&H.  No  evidence of hemolysis.  Outpatient follow-up with oncology.  Continue prednisone.  Discussed with Dr. Iruku/hematology via secure chat on 04/21/2022 who recommended to hold mycophenolate while patient is getting treatment for C. difficile and to continue prednisone.  Recent emphysematous cystitis Acute urinary retention -CT on admission did not show evidence of cystitis.  DC'd antibiotics on 04/19/2022.  Currently has a Foley catheter and was supposed to follow-up with urology on 04/20/2022 for possible removal.  Communicated with Dr. Junious Silk on 04/20/2022 via secure chat who recommended to keep the Foley catheter in place and patient has follow-up appointment with Dr. Matilde Sprang on 04/27/2022 at 9:30 AM.  Hypertension -continue current regimen.  Physical deconditioning -PT recommends home health PT  Moderate malnutrition -Follow nutrition recommendations  Goals of care -Discussed goals of care and she is agreeable for DNR.  DVT prophylaxis: Coumadin will be held today as well Code Status: DNR Family Communication: None at bedside Disposition Plan: Status is: Inpatient Remains inpatient appropriate because: Of severity of illness  Consultants: Contacted urology via secure chat on 04/20/2022.  Communicated with hematology/Dr. Chryl Heck via secure chat on 04/21/2022 Procedures: None  Antimicrobials:  Anti-infectives (From admission, onward)    Start     Dose/Rate Route Frequency Ordered Stop   04/19/22 1000  fidaxomicin (DIFICID) tablet 200 mg        200 mg Oral 2 times daily 04/19/22 0810 04/29/22 0959   04/18/22 2200  piperacillin-tazobactam (ZOSYN) IVPB 3.375 g  Status:  Discontinued        3.375 g 12.5 mL/hr over 240 Minutes Intravenous Every 12 hours 04/18/22 1528 04/19/22 0823   04/18/22 1530  piperacillin-tazobactam (ZOSYN) IVPB 3.375 g  Status:  Discontinued        3.375 g 100 mL/hr over  30 Minutes Intravenous  Once 04/18/22 1528 04/18/22 1544   04/18/22 1445   piperacillin-tazobactam (ZOSYN) IVPB 3.375 g        3.375 g 100 mL/hr over 30 Minutes Intravenous  Once 04/18/22 1431 04/18/22 1518        Subjective: Patient seen and examined at bedside.  Denies any more rectal bleeding since yesterday morning.  Wants to restart Coumadin.  Feels intermittently anxious.  Denies worsening shortness of breath, fever or vomiting.   Objective: Vitals:   04/21/22 1057 04/21/22 1325 04/21/22 2018 04/22/22 0545  BP: (!) 165/43 (!) 133/44 (!) 143/46 (!) 146/42  Pulse: 70 61 66 65  Resp: '18 20 16 16  '$ Temp: 97.7 F (36.5 C) 97.7 F (36.5 C) 97.7 F (36.5 C) 97.8 F (36.6 C)  TempSrc: Oral Oral Oral Oral  SpO2: 99% 99% 99% 99%  Weight:      Height:        Intake/Output Summary (Last 24 hours) at 04/22/2022 1103 Last data filed at 04/21/2022 1700 Gross per 24 hour  Intake 823.75 ml  Output 600 ml  Net 223.75 ml    Filed Weights   04/18/22 1206  Weight: 59.4 kg    Examination:  General: On room air currently.  No distress.  Chronically ill and deconditioned looking ENT/neck: JVD is currently not elevated and no palpable neck masses noted respiratory: Decreased breath sounds at bases bilaterally, no wheezing  CVS: S1 and S2 heard; rate controlled currently abdominal: Soft, nontender, still slightly distended; no organomegaly, normal bowel sounds heard  extremities: No cyanosis noted; bilateral lower extremity pitting edema present CNS: Awake and alert.  No focal neurologic deficit.  Able to move extremities lymph: No palpable lymphadenopathy noted  skin: No obvious lesions/ecchymosis  psych: Not agitated.  Mostly flat affect.   Musculoskeletal: No obvious joint swelling/deformity     Data Reviewed: I have personally reviewed following labs and imaging studies  CBC: Recent Labs  Lab 04/17/22 1241 04/18/22 1216 04/19/22 0859 04/20/22 0537 04/21/22 0534 04/21/22 1524 04/22/22 0530  WBC 10.7* 11.7* 9.0 6.7 5.1  --  4.4  NEUTROABS 9.9*  10.5*  --  5.6 4.2  --  3.4  HGB 9.8* 10.0* 9.5* 8.1* 7.7* 9.4* 9.1*  HCT 28.8* 30.0* 29.3* 25.1* 23.9* 28.3* 27.9*  MCV 90.9 92.0 93.3 93.7 94.5  --  91.2  PLT 122* 130* 119* 118* 116*  --  121*    Basic Metabolic Panel: Recent Labs  Lab 04/18/22 1216 04/19/22 0859 04/20/22 0537 04/21/22 0534 04/22/22 0530  NA 137 141 135 134* 135  K 3.8 3.7 4.1 4.3 4.5  CL 108 111 108 106 105  CO2 20* 18* 19* 20* 22  GLUCOSE 96 65* 204* 276* 162*  BUN 80* 64* 65* 75* 73*  CREATININE 2.56* 2.19* 2.36* 2.49* 2.31*  CALCIUM 7.9* 7.9* 7.6* 8.0* 8.2*  MG  --   --  2.1 2.1 2.1    GFR: Estimated Creatinine Clearance: 14.5 mL/min (A) (by C-G formula based on SCr of 2.31 mg/dL (H)). Liver Function Tests: Recent Labs  Lab 04/17/22 1241 04/18/22 1216 04/19/22 0859 04/20/22 0537  AST '28 31 29 24  '$ ALT '22 22 20 17  '$ ALKPHOS 47 53 55 48  BILITOT 0.7 0.8 1.0 0.4  PROT 5.2* 5.3* 5.1* 4.5*  ALBUMIN 2.9* 2.9* 2.6* 2.3*    No results for input(s): "LIPASE", "AMYLASE" in the last 168 hours. No results for input(s): "AMMONIA" in the last 168 hours. Coagulation  Profile: Recent Labs  Lab 04/18/22 1216 04/19/22 0859 04/20/22 0537 04/21/22 0534 04/22/22 0530  INR 3.8* 3.8* 3.3* 1.6* 1.0    Cardiac Enzymes: No results for input(s): "CKTOTAL", "CKMB", "CKMBINDEX", "TROPONINI" in the last 168 hours. BNP (last 3 results) No results for input(s): "PROBNP" in the last 8760 hours. HbA1C: No results for input(s): "HGBA1C" in the last 72 hours. CBG: Recent Labs  Lab 04/20/22 2113 04/21/22 0757 04/21/22 1213 04/21/22 1641 04/22/22 0806  GLUCAP 338* 231* 268* 312* 161*    Lipid Profile: No results for input(s): "CHOL", "HDL", "LDLCALC", "TRIG", "CHOLHDL", "LDLDIRECT" in the last 72 hours. Thyroid Function Tests: No results for input(s): "TSH", "T4TOTAL", "FREET4", "T3FREE", "THYROIDAB" in the last 72 hours. Anemia Panel: No results for input(s): "VITAMINB12", "FOLATE", "FERRITIN", "TIBC",  "IRON", "RETICCTPCT" in the last 72 hours. Sepsis Labs: Recent Labs  Lab 04/18/22 1216 04/18/22 1415  LATICACIDVEN 1.0 1.0     Recent Results (from the past 240 hour(s))  Urine Culture     Status: None   Collection Time: 04/14/22  1:29 PM   Specimen: Urine, Catheterized  Result Value Ref Range Status   Specimen Description   Final    URINE, CATHETERIZED Performed at [facility] [address], [place], [place] [address]    Special Requests   Final    NONE Performed at [facility], [place] [address] [place], [place] [address]    Culture   Final    NO GROWTH Performed at [facility], [place] [address], [place], [place] [address]    Report Status 04/15/2022 FINAL  Final  Culture, blood (Routine x 2)     Status: None (Preliminary result)   Collection Time: 04/18/22 12:16 PM   Specimen: BLOOD  Result Value Ref Range Status   Specimen Description   Final    BLOOD RIGHT ANTECUBITAL Performed at [facility] [address] [place], [place] [address]    Special Requests   Final    BOTTLES DRAWN AEROBIC AND ANAEROBIC Blood Culture results may not be optimal due to an excessive volume of blood received in culture bottles Performed at [facility] [address], [place], [place] [address]    Culture   Final    NO GROWTH 4 DAYS Performed at [facility], [place] [address], [place], [place] [address]    Report Status PENDING  Incomplete  Culture, blood (Routine x 2)     Status: None (Preliminary result)   Collection Time: 04/18/22 12:26 PM   Specimen: BLOOD  Result Value Ref Range Status   Specimen Description   Final    BLOOD LEFT ANTECUBITAL Performed at [facility] [address] [place], [place] [address]    Special Requests   Final    BOTTLES DRAWN AEROBIC AND ANAEROBIC Blood Culture adequate volume Performed at [facility] [address], [place], [place] [address]    Culture   Final    NO GROWTH 4 DAYS Performed at [facility], [place] [address], [place], [place] [address]    Report Status PENDING  Incomplete  Urine Culture     Status: None   Collection Time: 04/18/22 12:29 PM   Specimen: In/Out Cath Urine  Result Value Ref Range Status   Specimen Description   Final    IN/OUT CATH URINE Performed at [facility], [place] [address] [place], [place] [address]  Special Requests   Final    NONE Performed at [facility], [place] [address] [place], [place] [address]    Culture   Final    NO GROWTH Performed at [facility], [place] [address] [place] [address], [place], [place] [address]    Report Status 04/19/2022 FINAL  Final  C Difficile Quick Screen w PCR reflex     Status: Abnormal   Collection Time: 04/19/22  2:00 AM   Specimen: STOOL  Result Value Ref Range Status   C Diff antigen POSITIVE (A) NEGATIVE Final   C Diff toxin NEGATIVE NEGATIVE Final   C Diff interpretation Results are indeterminate. See PCR results.  Final    Comment: Performed at [facility], [place] [address], [place], [place] [address]  Gastrointestinal Panel by PCR , Stool     Status: None   Collection Time: 04/19/22  2:00 AM   Specimen: Stool  Result Value Ref Range Status   Campylobacter species NOT DETECTED NOT DETECTED Final   Plesimonas shigelloides NOT DETECTED NOT DETECTED Final   Salmonella species NOT DETECTED NOT DETECTED Final   Yersinia enterocolitica NOT DETECTED NOT DETECTED Final   Vibrio species NOT DETECTED NOT DETECTED Final   Vibrio cholerae NOT DETECTED NOT DETECTED Final   Enteroaggregative E coli (EAEC) NOT DETECTED NOT DETECTED Final   Enteropathogenic E coli (EPEC) NOT DETECTED NOT DETECTED Final   Enterotoxigenic E coli (ETEC) NOT DETECTED NOT DETECTED Final   Shiga like toxin producing E coli (STEC) NOT DETECTED NOT DETECTED Final   Shigella/Enteroinvasive E coli  (EIEC) NOT DETECTED NOT DETECTED Final   Cryptosporidium NOT DETECTED NOT DETECTED Final   Cyclospora cayetanensis NOT DETECTED NOT DETECTED Final   Entamoeba histolytica NOT DETECTED NOT DETECTED Final   Giardia lamblia NOT DETECTED NOT DETECTED Final   Adenovirus F40/41 NOT DETECTED NOT DETECTED Final   Astrovirus NOT DETECTED NOT DETECTED Final   Norovirus GI/GII NOT DETECTED NOT DETECTED Final   Rotavirus A NOT DETECTED NOT DETECTED Final   Sapovirus (I, II, IV, and V) NOT DETECTED NOT DETECTED Final    Comment: Performed at [facility], [place] [place], [place] [address]  C. Diff by PCR, Reflexed     Status: Abnormal   Collection Time: 04/19/22  2:00 AM  Result Value Ref Range Status   Toxigenic C. Difficile by PCR POSITIVE (A) NEGATIVE Final    Comment: Positive for toxigenic C. difficile with little to no toxin production. Only treat if clinical presentation suggests symptomatic illness. Performed at [facility], [place] [address], [place], [place] [address]          Radiology Studies: No results found.      Scheduled Meds:  allopurinol  100 mg Oral Daily   ALPRAZolam  0.25 mg Oral QHS   amLODipine  10 mg Oral Daily   aspirin EC  81 mg Oral Daily   atorvastatin  80 mg Oral q1800   Chlorhexidine Gluconate Cloth  6 each Topical Daily   cloNIDine  0.2 mg Oral BID   fidaxomicin  200 mg Oral BID   folic acid  1 mg Oral Daily   furosemide  80 mg Oral Daily   And   furosemide  40 mg Oral QPM   hydrALAZINE  100 mg Oral TID   insulin aspart  0-5 Units Subcutaneous QHS   insulin aspart  0-9 Units Subcutaneous TID WC   insulin glargine-yfgn  9 Units  Subcutaneous Daily   lactose free nutrition  237 mL Oral TID WC   metoprolol succinate  12.5 mg Oral Daily   mirabegron ER  25 mg Oral Daily   pantoprazole  40 mg Oral Daily   predniSONE  20 mg Oral Q breakfast   spironolactone  25 mg Oral Daily   tadalafil  40 mg Oral QPM   Warfarin - Pharmacist  Dosing Inpatient   Does not apply q1600   Continuous Infusions:        [name], MD [facility] 04/22/2022, 11:03 AM

## 2022-04-22 NOTE — Plan of Care (Signed)
No bleeding noted this shift. Pt. Asking for her coumadin this shift. Pt reminded of the blood that was noted in her stool yesterday morning and that is why coumadin was held for now. Pt understands, but is eager to begin anticoagulant again.     Problem: Education: Goal: Knowledge of General Education information will improve Description: Including pain rating scale, medication(s)/side effects and non-pharmacologic comfort measures Outcome: Progressing   Problem: Health Behavior/Discharge Planning: Goal: Ability to manage health-related needs will improve Outcome: Progressing   Problem: Clinical Measurements: Goal: Ability to maintain clinical measurements within normal limits will improve Outcome: Progressing Goal: Will remain free from infection Outcome: Progressing Goal: Diagnostic test results will improve Outcome: Progressing Goal: Respiratory complications will improve Outcome: Progressing Goal: Cardiovascular complication will be avoided Outcome: Progressing   Problem: Activity: Goal: Risk for activity intolerance will decrease Outcome: Progressing   Problem: Nutrition: Goal: Adequate nutrition will be maintained Outcome: Progressing   Problem: Coping: Goal: Level of anxiety will decrease Outcome: Progressing   Problem: Elimination: Goal: Will not experience complications related to bowel motility Outcome: Progressing Goal: Will not experience complications related to urinary retention Outcome: Progressing   Problem: Pain Managment: Goal: General experience of comfort will improve Outcome: Progressing   Problem: Safety: Goal: Ability to remain free from injury will improve Outcome: Progressing   Problem: Skin Integrity: Goal: Risk for impaired skin integrity will decrease Outcome: Progressing   Problem: Education: Goal: Ability to describe self-care measures that may prevent or decrease complications (Diabetes Survival Skills Education) will  improve Outcome: Progressing Goal: Individualized Educational Video(s) Outcome: Progressing   Problem: Coping: Goal: Ability to adjust to condition or change in health will improve Outcome: Progressing   Problem: Fluid Volume: Goal: Ability to maintain a balanced intake and output will improve Outcome: Progressing   Problem: Health Behavior/Discharge Planning: Goal: Ability to identify and utilize available resources and services will improve Outcome: Progressing Goal: Ability to manage health-related needs will improve Outcome: Progressing   Problem: Metabolic: Goal: Ability to maintain appropriate glucose levels will improve Outcome: Progressing   Problem: Nutritional: Goal: Maintenance of adequate nutrition will improve Outcome: Progressing Goal: Progress toward achieving an optimal weight will improve Outcome: Progressing   Problem: Skin Integrity: Goal: Risk for impaired skin integrity will decrease Outcome: Progressing   Problem: Tissue Perfusion: Goal: Adequacy of tissue perfusion will improve Outcome: Progressing

## 2022-04-22 NOTE — Progress Notes (Signed)
ANTICOAGULATION CONSULT NOTE - Follow Up Consult  Pharmacy Consult for warfarin/ IV heparin  Indication: PAF, mechanical AVR  Allergies  Allergen Reactions   Flagyl [Metronidazole] Rash and Other (See Comments)    ALL-OVER BODY RASH   Ciprofloxacin Nausea Only and Other (See Comments)    Stomach ache   Coreg [Carvedilol] Other (See Comments)    Terrible cramping in the feet and had a lot of bowel movements, but not diarrhea   Diflucan [Fluconazole] Diarrhea   Losartan Swelling and Other (See Comments)    Patient doesn't recall site of swelling   Privigen [Immune Globulin (Human)] Other (See Comments)    Severe/excruciating pain   Sulfamethoxazole-Trimethoprim Nausea Only and Other (See Comments)    Stomach upset   Verapamil Hives   Zetia [Ezetimibe] Other (See Comments)    Reaction not recalled   Zocor [Simvastatin - High Dose] Other (See Comments)    Reaction not recalled   Gatifloxacin Rash and Other (See Comments)    Redness to skin around eye    Patient Measurements: Height: '4\' 11"'$  (149.9 cm) Weight: 59.4 kg (131 lb) IBW/kg (Calculated) : 43.2   Vital Signs: Temp: 97.8 F (36.6 C) (07/15 0545) Temp Source: Oral (07/15 0545) BP: 146/42 (07/15 0545) Pulse Rate: 65 (07/15 0545)  Labs: Recent Labs    04/20/22 0537 04/21/22 0534 04/21/22 1524 04/22/22 0530  HGB 8.1* 7.7* 9.4* 9.1*  HCT 25.1* 23.9* 28.3* 27.9*  PLT 118* 116*  --  121*  LABPROT 32.9* 18.4*  --  13.3  INR 3.3* 1.6*  --  1.0  CREATININE 2.36* 2.49*  --  2.31*     Estimated Creatinine Clearance: 14.5 mL/min (A) (by C-G formula based on SCr of 2.31 mg/dL (H)).   Medications:  - Warfarin 5 mg tabs: 7.5 mg after supper on Sunday, Wednesday, and Friday. 5 mg on Monday, Tuesday, Thursday, and Saturday.   Assessment: 27 YOWF admitted for persistent diverticulitis. w/ hx of PAF and mechanical AVR on warfarin PTA, CAD (CABG 2005), PAH, CHF, HTN, DM, vasculitis, and autoimmune hemolytic anemia.  Baseline INR supratherapeutic.  Pharmacy consulted to dose warfarin.  Today, 04/21/22 - INR today 1.0, subtherapeutic, expected since pt did not receive doses past 2 days  - CBC low, but stable, No further noted bleeding issues  - Starting heparin drip bridge today as well. Per consult no bolus - Patient has diet order, is consuming 75-95% of meals - Last warfarin dose on 04/17/22, patient refused dose on 04/20/22 and dose of warfarin was held on 7/14   Goal of Therapy:  INR 2-3 Monitor platelets by anticoagulation protocol: Yes   Plan:  - discussed w/ MD, ok to resume warfarin today   - Warfarin 7.5 mg PO x1  - Heparin no bolus as per consult  - Start heparin drip 850 units/hr  - Obtain HL 8 hours after start of infusion   - Daily INR and CBC - Monitor for signs of bleeding/thrombosis     Royetta Asal, PharmD, BCPS 04/22/2022 11:25 AM

## 2022-04-22 NOTE — Plan of Care (Signed)
No acute events overnight.  Problem: Education: Goal: Knowledge of General Education information will improve Description: Including pain rating scale, medication(s)/side effects and non-pharmacologic comfort measures 04/22/2022 0452 by Maricela Bo, RN Outcome: Progressing 04/22/2022 0326 by Maricela Bo, RN Outcome: Progressing   Problem: Health Behavior/Discharge Planning: Goal: Ability to manage health-related needs will improve 04/22/2022 0452 by Maricela Bo, RN Outcome: Progressing 04/22/2022 0326 by Maricela Bo, RN Outcome: Progressing   Problem: Clinical Measurements: Goal: Ability to maintain clinical measurements within normal limits will improve 04/22/2022 0452 by Maricela Bo, RN Outcome: Progressing 04/22/2022 0326 by Maricela Bo, RN Outcome: Progressing Goal: Will remain free from infection 04/22/2022 0452 by Maricela Bo, RN Outcome: Progressing 04/22/2022 0326 by Maricela Bo, RN Outcome: Progressing Goal: Diagnostic test results will improve 04/22/2022 0452 by Maricela Bo, RN Outcome: Progressing 04/22/2022 0326 by Maricela Bo, RN Outcome: Progressing Goal: Respiratory complications will improve 04/22/2022 0452 by Maricela Bo, RN Outcome: Progressing 04/22/2022 0326 by Maricela Bo, RN Outcome: Progressing Goal: Cardiovascular complication will be avoided 04/22/2022 0452 by Maricela Bo, RN Outcome: Progressing 04/22/2022 0326 by Maricela Bo, RN Outcome: Progressing   Problem: Activity: Goal: Risk for activity intolerance will decrease 04/22/2022 0452 by Maricela Bo, RN Outcome: Progressing 04/22/2022 0326 by Maricela Bo, RN Outcome: Progressing   Problem: Nutrition: Goal: Adequate nutrition will be maintained 04/22/2022 0452 by Maricela Bo, RN Outcome: Progressing 04/22/2022 0326 by Maricela Bo, RN Outcome: Progressing   Problem: Coping: Goal: Level of anxiety will decrease 04/22/2022 0452 by  Maricela Bo, RN Outcome: Progressing 04/22/2022 0326 by Maricela Bo, RN Outcome: Progressing   Problem: Elimination: Goal: Will not experience complications related to bowel motility 04/22/2022 0452 by Maricela Bo, RN Outcome: Progressing 04/22/2022 0326 by Maricela Bo, RN Outcome: Progressing Goal: Will not experience complications related to urinary retention 04/22/2022 0452 by Maricela Bo, RN Outcome: Progressing 04/22/2022 0326 by Maricela Bo, RN Outcome: Progressing   Problem: Pain Managment: Goal: General experience of comfort will improve 04/22/2022 0452 by Maricela Bo, RN Outcome: Progressing 04/22/2022 0326 by Maricela Bo, RN Outcome: Progressing   Problem: Safety: Goal: Ability to remain free from injury will improve 04/22/2022 0452 by Maricela Bo, RN Outcome: Progressing 04/22/2022 0326 by Maricela Bo, RN Outcome: Progressing   Problem: Skin Integrity: Goal: Risk for impaired skin integrity will decrease 04/22/2022 0452 by Maricela Bo, RN Outcome: Progressing 04/22/2022 0326 by Maricela Bo, RN Outcome: Progressing   Problem: Education: Goal: Ability to describe self-care measures that may prevent or decrease complications (Diabetes Survival Skills Education) will improve 04/22/2022 0452 by Maricela Bo, RN Outcome: Progressing 04/22/2022 0326 by Maricela Bo, RN Outcome: Progressing Goal: Individualized Educational Video(s) 04/22/2022 0452 by Maricela Bo, RN Outcome: Progressing 04/22/2022 0326 by Maricela Bo, RN Outcome: Progressing   Problem: Coping: Goal: Ability to adjust to condition or change in health will improve 04/22/2022 0452 by Maricela Bo, RN Outcome: Progressing 04/22/2022 0326 by Maricela Bo, RN Outcome: Progressing   Problem: Fluid Volume: Goal: Ability to maintain a balanced intake and output will improve 04/22/2022 0452 by Maricela Bo, RN Outcome: Progressing 04/22/2022  0326 by Maricela Bo, RN Outcome: Progressing   Problem: Health Behavior/Discharge Planning: Goal: Ability to identify and utilize available resources and services will improve 04/22/2022 0452 by Maricela Bo, RN Outcome: Progressing 04/22/2022  0326 by Maricela Bo, RN Outcome: Progressing Goal: Ability to manage health-related needs will improve 04/22/2022 0452 by Maricela Bo, RN Outcome: Progressing 04/22/2022 0326 by Maricela Bo, RN Outcome: Progressing   Problem: Metabolic: Goal: Ability to maintain appropriate glucose levels will improve 04/22/2022 0452 by Maricela Bo, RN Outcome: Progressing 04/22/2022 0326 by Maricela Bo, RN Outcome: Progressing   Problem: Nutritional: Goal: Maintenance of adequate nutrition will improve 04/22/2022 0452 by Maricela Bo, RN Outcome: Progressing 04/22/2022 0326 by Maricela Bo, RN Outcome: Progressing Goal: Progress toward achieving an optimal weight will improve 04/22/2022 0452 by Maricela Bo, RN Outcome: Progressing 04/22/2022 0326 by Maricela Bo, RN Outcome: Progressing   Problem: Skin Integrity: Goal: Risk for impaired skin integrity will decrease 04/22/2022 0452 by Maricela Bo, RN Outcome: Progressing 04/22/2022 0326 by Maricela Bo, RN Outcome: Progressing   Problem: Tissue Perfusion: Goal: Adequacy of tissue perfusion will improve 04/22/2022 0452 by Maricela Bo, RN Outcome: Progressing 04/22/2022 0326 by Maricela Bo, RN Outcome: Progressing

## 2022-04-22 NOTE — Progress Notes (Signed)
ANTICOAGULATION CONSULT NOTE - Follow Up Consult  Pharmacy Consult for warfarin/ IV heparin  Indication: PAF, mechanical AVR  Allergies  Allergen Reactions   Flagyl [Metronidazole] Rash and Other (See Comments)    ALL-OVER BODY RASH   Ciprofloxacin Nausea Only and Other (See Comments)    Stomach ache   Coreg [Carvedilol] Other (See Comments)    Terrible cramping in the feet and had a lot of bowel movements, but not diarrhea   Diflucan [Fluconazole] Diarrhea   Losartan Swelling and Other (See Comments)    Patient doesn't recall site of swelling   Privigen [Immune Globulin (Human)] Other (See Comments)    Severe/excruciating pain   Sulfamethoxazole-Trimethoprim Nausea Only and Other (See Comments)    Stomach upset   Verapamil Hives   Zetia [Ezetimibe] Other (See Comments)    Reaction not recalled   Zocor [Simvastatin - High Dose] Other (See Comments)    Reaction not recalled   Gatifloxacin Rash and Other (See Comments)    Redness to skin around eye    Patient Measurements: Height: '4\' 11"'$  (149.9 cm) Weight: 59.4 kg (131 lb) IBW/kg (Calculated) : 43.2   Vital Signs: Temp: 98 F (36.7 C) (07/15 2012) Temp Source: Oral (07/15 2012) BP: 147/58 (07/15 2012) Pulse Rate: 62 (07/15 2012)  Labs: Recent Labs    04/20/22 0537 04/21/22 0534 04/21/22 1524 04/22/22 0530 04/22/22 2239  HGB 8.1* 7.7* 9.4* 9.1*  --   HCT 25.1* 23.9* 28.3* 27.9*  --   PLT 118* 116*  --  121*  --   LABPROT 32.9* 18.4*  --  13.3  --   INR 3.3* 1.6*  --  1.0  --   HEPARINUNFRC  --   --   --   --  0.31  CREATININE 2.36* 2.49*  --  2.31*  --      Estimated Creatinine Clearance: 14.5 mL/min (A) (by C-G formula based on SCr of 2.31 mg/dL (H)).   Medications:  - Warfarin 5 mg tabs: 7.5 mg after supper on Sunday, Wednesday, and Friday. 5 mg on Monday, Tuesday, Thursday, and Saturday.   Assessment: 42 YOWF admitted for persistent diverticulitis. w/ hx of PAF and mechanical AVR on warfarin PTA, CAD  (CABG 2005), PAH, CHF, HTN, DM, vasculitis, and autoimmune hemolytic anemia. Baseline INR supratherapeutic.  Pharmacy consulted to dose warfarin.  Today, 04/21/22 - INR today 1.0, subtherapeutic, expected since pt did not receive doses past 2 days  - CBC low, but stable, No further noted bleeding issues  - Starting heparin drip bridge today as well. Per consult no bolus - Patient has diet order, is consuming 75-95% of meals - Last warfarin dose on 04/17/22, patient refused dose on 04/20/22 and dose of warfarin was held on 7/14  2239 HL 0.31 low end of therapeutic (850 units/hr) Per RN no bleeding or interruptions  Goal of Therapy:  INR 2-3 Monitor platelets by anticoagulation protocol: Yes   Plan:  - increase heparin drip slightly to 900 units/hr - Heparin no bolus as per consult  - Obtain HL in 8 hours  - Daily INR and CBC - Monitor for signs of bleeding/thrombosis     [name] RPh 04/22/2022, 11:52 PM

## 2022-04-23 DIAGNOSIS — N184 Chronic kidney disease, stage 4 (severe): Secondary | ICD-10-CM | POA: Diagnosis not present

## 2022-04-23 DIAGNOSIS — A0472 Enterocolitis due to Clostridium difficile, not specified as recurrent: Secondary | ICD-10-CM | POA: Diagnosis not present

## 2022-04-23 DIAGNOSIS — N3080 Other cystitis without hematuria: Secondary | ICD-10-CM | POA: Diagnosis not present

## 2022-04-23 DIAGNOSIS — I5032 Chronic diastolic (congestive) heart failure: Secondary | ICD-10-CM | POA: Diagnosis not present

## 2022-04-23 LAB — CULTURE, BLOOD (ROUTINE X 2)
Culture: NO GROWTH
Culture: NO GROWTH
Special Requests: ADEQUATE

## 2022-04-23 LAB — CBC WITH DIFFERENTIAL/PLATELET
Abs Immature Granulocytes: 0.04 10*3/uL (ref 0.00–0.07)
Basophils Absolute: 0 10*3/uL (ref 0.0–0.1)
Basophils Relative: 0 %
Eosinophils Absolute: 0 10*3/uL (ref 0.0–0.5)
Eosinophils Relative: 1 %
HCT: 32 % — ABNORMAL LOW (ref 36.0–46.0)
Hemoglobin: 10.5 g/dL — ABNORMAL LOW (ref 12.0–15.0)
Immature Granulocytes: 1 %
Lymphocytes Relative: 12 %
Lymphs Abs: 0.6 10*3/uL — ABNORMAL LOW (ref 0.7–4.0)
MCH: 29.9 pg (ref 26.0–34.0)
MCHC: 32.8 g/dL (ref 30.0–36.0)
MCV: 91.2 fL (ref 80.0–100.0)
Monocytes Absolute: 0.4 10*3/uL (ref 0.1–1.0)
Monocytes Relative: 8 %
Neutro Abs: 4.1 10*3/uL (ref 1.7–7.7)
Neutrophils Relative %: 78 %
Platelets: 157 10*3/uL (ref 150–400)
RBC: 3.51 MIL/uL — ABNORMAL LOW (ref 3.87–5.11)
RDW: 16.1 % — ABNORMAL HIGH (ref 11.5–15.5)
WBC: 5.2 10*3/uL (ref 4.0–10.5)
nRBC: 0 % (ref 0.0–0.2)

## 2022-04-23 LAB — BASIC METABOLIC PANEL
Anion gap: 10 (ref 5–15)
BUN: 71 mg/dL — ABNORMAL HIGH (ref 8–23)
CO2: 18 mmol/L — ABNORMAL LOW (ref 22–32)
Calcium: 8.6 mg/dL — ABNORMAL LOW (ref 8.9–10.3)
Chloride: 103 mmol/L (ref 98–111)
Creatinine, Ser: 2.1 mg/dL — ABNORMAL HIGH (ref 0.44–1.00)
GFR, Estimated: 23 mL/min — ABNORMAL LOW (ref >60)
Glucose, Bld: 236 mg/dL — ABNORMAL HIGH (ref 70–99)
Potassium: 4.5 mmol/L (ref 3.5–5.1)
Sodium: 131 mmol/L — ABNORMAL LOW (ref 135–145)

## 2022-04-23 LAB — PROTIME-INR
INR: 1.1 (ref 0.8–1.2)
Prothrombin Time: 13.9 seconds (ref 11.4–15.2)

## 2022-04-23 LAB — GLUCOSE, CAPILLARY
Glucose-Capillary: 206 mg/dL — ABNORMAL HIGH (ref 70–99)
Glucose-Capillary: 170 mg/dL — ABNORMAL HIGH (ref 70–99)
Glucose-Capillary: 333 mg/dL — ABNORMAL HIGH (ref 70–99)
Glucose-Capillary: 279 mg/dL — ABNORMAL HIGH (ref 70–99)

## 2022-04-23 LAB — HEPARIN LEVEL (UNFRACTIONATED): Heparin Unfractionated: 0.39 IU/mL (ref 0.30–0.70)

## 2022-04-23 MED ORDER — WARFARIN SODIUM 5 MG PO TABS
7.5000 mg | ORAL_TABLET | Freq: Once | ORAL | Status: AC
  Administered 2022-04-23: 7.5 mg via ORAL
  Filled 2022-04-23: qty 1

## 2022-04-23 NOTE — Progress Notes (Signed)
PROGRESS NOTE    [name]  MKL:[id number] DOB: [birth year] DOA: 04/18/2022 PCP: [name], MD   Brief Narrative:  81 y.o. female with medical history significant of chronic diastolic HF, autoimmune hemolytic anemia, DM2, HLD, HTN, vasculitis, PAF, recent admission from 04/12/2022-04/16/2022 for emphysematous cystitis (colovesical fistula ruled out) treated with IV antibiotics and subsequent discharge on oral Augmentin presented with worsening vomiting and diarrhea with positive fevers.  On presentation, creatinine was 2.56 with WBC of 11.7.  CT of abdomen and pelvis showed possible sigmoid diverticulitis versus colitis.  She was started on broad-spectrum antibiotics.  She subsequently tested positive for C. difficile and was started on Dificid; antibiotics were discontinued.  Assessment & Plan:   Possible C. difficile colitis/diverticulitis -Stool for C. difficile tested positive.  Imaging as above.  Broad-spectrum antibiotics discontinued on 04/19/2022.  Started Dificid on 04/19/2022: Finish 10-day course of therapy.  Diarrhea has improved.  CKD stage IV -Creatinine at baseline.  Monitor  Leukocytosis -Resolved  Thrombocytopenia -Improved this morning.  No signs of bleeding  Chronic diastolic CHF PAF on Coumadin PAH Supratherapeutic INR -Strict input and output.  Daily weights.  Fluid restriction.  Continue current regimen of Lasix along with hydralazine, Toprol-XL, spironolactone.  INR 1.1 today.  Repeat a.m. INR.  Coumadin was resumed last night.  Currently on heparin bridge as well.  Outpatient follow-up with cardiology. -Continue tadalafil  History of autoimmune hemolytic anemia History of vasculitis Anemia of chronic disease -Hematology had recommended in the past to transfuse if hemoglobin is less than 8.  Status post 1 unit packed red cell transfusion on 04/21/2022.  Hemoglobin 10.5 today.  Monitor H&H.  No evidence of hemolysis.  Outpatient follow-up with oncology.   Continue prednisone.  Discussed with Dr. Iruku/hematology via secure chat on 04/21/2022 who recommended to hold mycophenolate while patient is getting treatment for C. difficile and to continue prednisone.  Recent emphysematous cystitis Acute urinary retention -CT on admission did not show evidence of cystitis.  DC'd antibiotics on 04/19/2022.  Currently has a Foley catheter and was supposed to follow-up with urology on 04/20/2022 for possible removal.  Communicated with Dr. Junious Silk on 04/20/2022 via secure chat who recommended to keep the Foley catheter in place and patient has follow-up appointment with Dr. Matilde Sprang on 04/27/2022 at 9:30 AM.  Hyponatremia -Mild.  Monitor  Hypertension -continue current regimen.  Physical deconditioning -PT recommends home health PT  Moderate malnutrition -Follow nutrition recommendations  Goals of care -Discussed goals of care and she is agreeable for DNR.  DVT prophylaxis: Coumadin will be held today as well Code Status: DNR Family Communication: None at bedside Disposition Plan: Status is: Inpatient Remains inpatient appropriate because: Of severity of illness  Consultants: Contacted urology via secure chat on 04/20/2022.  Communicated with hematology/Dr. Chryl Heck via secure chat on 04/21/2022 Procedures: None  Antimicrobials:  Anti-infectives (From admission, onward)    Start     Dose/Rate Route Frequency Ordered Stop   04/19/22 1000  fidaxomicin (DIFICID) tablet 200 mg        200 mg Oral 2 times daily 04/19/22 0810 04/29/22 0959   04/18/22 2200  piperacillin-tazobactam (ZOSYN) IVPB 3.375 g  Status:  Discontinued        3.375 g 12.5 mL/hr over 240 Minutes Intravenous Every 12 hours 04/18/22 1528 04/19/22 0823   04/18/22 1530  piperacillin-tazobactam (ZOSYN) IVPB 3.375 g  Status:  Discontinued        3.375 g 100 mL/hr over 30 Minutes Intravenous  Once  04/18/22 1528 04/18/22 1544   04/18/22 1445  piperacillin-tazobactam (ZOSYN) IVPB 3.375 g         3.375 g 100 mL/hr over 30 Minutes Intravenous  Once 04/18/22 1431 04/18/22 1518        Subjective: Patient seen and examined at bedside.  Did take Coumadin last night.  Denies any rectal bleeding.  Tolerating diet.  Still feels weak.  No fever or vomiting reported.  Diarrhea has improved.  Objective: Vitals:   04/22/22 0545 04/22/22 1439 04/22/22 2012 04/23/22 0625  BP: (!) 146/42 (!) 133/46 (!) 147/58 (!) 156/56  Pulse: 65 (!) 51 62 60  Resp: '16 18 18 18  '$ Temp: 97.8 F (36.6 C) 98.1 F (36.7 C) 98 F (36.7 C) 97.6 F (36.4 C)  TempSrc: Oral Oral Oral Oral  SpO2: 99% 98% 99% 98%  Weight:      Height:        Intake/Output Summary (Last 24 hours) at 04/23/2022 1050 Last data filed at 04/23/2022 0807 Gross per 24 hour  Intake 243.94 ml  Output 1025 ml  Net -781.06 ml    Filed Weights   04/18/22 1206  Weight: 59.4 kg    Examination:  General: No acute distress.  Still on room air.  Chronically ill and deconditioned looking ENT/neck: JVD is not elevated.  No palpable neck masses noted  respiratory: Bilateral decreased breath sounds at bases with some basilar crackles CVS: Mild bradycardia present; S1 and S2 are heard  abdominal: Soft, nontender, distended mildly; no organomegaly, bowel sounds heard normally  extremities: Lower extremity edema present bilaterally; no clubbing  CNS: Alert and oriented.  No focal neurologic deficit.  Moving extremities  lymph: No obvious lymphadenopathy  skin: No obvious ulcers/petechiae  psych: Intermittently smiling.  Showing no signs of agitation currently.   Musculoskeletal: No obvious other joint swelling/deformity     Data Reviewed: I have personally reviewed following labs and imaging studies  CBC: Recent Labs  Lab 04/18/22 1216 04/19/22 0859 04/20/22 0537 04/21/22 0534 04/21/22 1524 04/22/22 0530 04/23/22 0831  WBC 11.7* 9.0 6.7 5.1  --  4.4 5.2  NEUTROABS 10.5*  --  5.6 4.2  --  3.4 4.1  HGB 10.0* 9.5* 8.1*  7.7* 9.4* 9.1* 10.5*  HCT 30.0* 29.3* 25.1* 23.9* 28.3* 27.9* 32.0*  MCV 92.0 93.3 93.7 94.5  --  91.2 91.2  PLT 130* 119* 118* 116*  --  121* 697    Basic Metabolic Panel: Recent Labs  Lab 04/19/22 0859 04/20/22 0537 04/21/22 0534 04/22/22 0530 04/23/22 0831  NA 141 135 134* 135 131*  K 3.7 4.1 4.3 4.5 4.5  CL 111 108 106 105 103  CO2 18* 19* 20* 22 18*  GLUCOSE 65* 204* 276* 162* 236*  BUN 64* 65* 75* 73* 71*  CREATININE 2.19* 2.36* 2.49* 2.31* 2.10*  CALCIUM 7.9* 7.6* 8.0* 8.2* 8.6*  MG  --  2.1 2.1 2.1  --     GFR: Estimated Creatinine Clearance: 15.9 mL/min (A) (by C-G formula based on SCr of 2.1 mg/dL (H)). Liver Function Tests: Recent Labs  Lab 04/17/22 1241 04/18/22 1216 04/19/22 0859 04/20/22 0537  AST '28 31 29 24  '$ ALT '22 22 20 17  '$ ALKPHOS 47 53 55 48  BILITOT 0.7 0.8 1.0 0.4  PROT 5.2* 5.3* 5.1* 4.5*  ALBUMIN 2.9* 2.9* 2.6* 2.3*    No results for input(s): "LIPASE", "AMYLASE" in the last 168 hours. No results for input(s): "AMMONIA" in the last 168 hours. Coagulation  Profile: Recent Labs  Lab 04/19/22 0859 04/20/22 0537 04/21/22 0534 04/22/22 0530 04/23/22 0813  INR 3.8* 3.3* 1.6* 1.0 1.1    Cardiac Enzymes: No results for input(s): "CKTOTAL", "CKMB", "CKMBINDEX", "TROPONINI" in the last 168 hours. BNP (last 3 results) No results for input(s): "PROBNP" in the last 8760 hours. HbA1C: No results for input(s): "HGBA1C" in the last 72 hours. CBG: Recent Labs  Lab 04/22/22 0806 04/22/22 1210 04/22/22 1713 04/22/22 2036 04/23/22 0730  GLUCAP 161* 190* 327* 290* 206*    Lipid Profile: No results for input(s): "CHOL", "HDL", "LDLCALC", "TRIG", "CHOLHDL", "LDLDIRECT" in the last 72 hours. Thyroid Function Tests: No results for input(s): "TSH", "T4TOTAL", "FREET4", "T3FREE", "THYROIDAB" in the last 72 hours. Anemia Panel: No results for input(s): "VITAMINB12", "FOLATE", "FERRITIN", "TIBC", "IRON", "RETICCTPCT" in the last 72 hours. Sepsis  Labs: Recent Labs  Lab 04/18/22 1216 04/18/22 1415  LATICACIDVEN 1.0 1.0     Recent Results (from the past 240 hour(s))  Urine Culture     Status: None   Collection Time: 04/14/22  1:29 PM   Specimen: Urine, Catheterized  Result Value Ref Range Status   Specimen Description   Final    URINE, CATHETERIZED Performed at [facility] [address] [place], [place] [address]    Special Requests   Final    NONE Performed at [facility], [place] [address] [place], [place] [address]    Culture   Final    NO GROWTH Performed at [facility], [place] [address], [place], [place] [address]    Report Status 04/15/2022 FINAL  Final  Culture, blood (Routine x 2)     Status: None   Collection Time: 04/18/22 12:16 PM   Specimen: BLOOD  Result Value Ref Range Status   Specimen Description   Final    BLOOD RIGHT ANTECUBITAL Performed at [place] [address], [place], [place] [address]    Special Requests   Final    BOTTLES DRAWN AEROBIC AND ANAEROBIC Blood Culture results may not be optimal due to an excessive volume of blood received in culture bottles Performed at [place] [address], [place], [place] [address]    Culture   Final    NO GROWTH 5 DAYS Performed at [facility], [place] [address], [place], [place] [address]    Report Status 04/23/2022 FINAL  Final  Culture, blood (Routine x 2)     Status: None   Collection Time: 04/18/22 12:26 PM   Specimen: BLOOD  Result Value Ref Range Status   Specimen Description   Final    BLOOD LEFT ANTECUBITAL Performed at [facility] [address], [place], [place] [address]    Special Requests   Final    BOTTLES DRAWN AEROBIC AND ANAEROBIC Blood Culture adequate volume Performed at [place] [address] [place], [place] [address]    Culture   Final    NO GROWTH 5 DAYS Performed at [facility], [place] [address], [place], [place] [address]    Report Status 04/23/2022 FINAL  Final  Urine Culture     Status: None   Collection Time: 04/18/22 12:29 PM   Specimen: In/Out Cath Urine  Result Value Ref Range Status   Specimen Description   Final    IN/OUT CATH URINE Performed at [place] [address] [place], [place] [address]    Special  Requests   Final    NONE Performed at [facility], [place] [address], [place], [place] [address]    Culture   Final    NO GROWTH Performed at [facility], [place] [address] [place], [place] [address]    Report Status 04/19/2022 FINAL  Final  C Difficile Quick Screen w PCR reflex     Status: Abnormal   Collection Time: 04/19/22  2:00 AM   Specimen: STOOL  Result Value Ref Range Status   C Diff antigen POSITIVE (A) NEGATIVE Final   C Diff toxin NEGATIVE NEGATIVE Final   C Diff interpretation Results are indeterminate. See PCR results.  Final    Comment: Performed at [facility], [place] [address], [place], [name] [address]  Gastrointestinal Panel by PCR , Stool     Status: None   Collection Time: 04/19/22  2:00 AM   Specimen: Stool  Result Value Ref Range Status   Campylobacter species NOT DETECTED NOT DETECTED Final   Plesimonas shigelloides NOT DETECTED NOT DETECTED Final   Salmonella species NOT DETECTED NOT DETECTED Final   Yersinia enterocolitica NOT DETECTED NOT DETECTED Final   Vibrio species NOT DETECTED NOT DETECTED Final   Vibrio cholerae NOT DETECTED NOT DETECTED Final   Enteroaggregative E coli (EAEC) NOT DETECTED NOT DETECTED Final   Enteropathogenic E coli (EPEC) NOT DETECTED NOT DETECTED Final   Enterotoxigenic E coli (ETEC) NOT DETECTED NOT DETECTED Final   Shiga like toxin producing E coli (STEC) NOT DETECTED NOT DETECTED Final   Shigella/Enteroinvasive E coli (EIEC) NOT DETECTED NOT DETECTED Final   Cryptosporidium NOT DETECTED NOT DETECTED Final    Cyclospora cayetanensis NOT DETECTED NOT DETECTED Final   Entamoeba histolytica NOT DETECTED NOT DETECTED Final   Giardia lamblia NOT DETECTED NOT DETECTED Final   Adenovirus F40/41 NOT DETECTED NOT DETECTED Final   Astrovirus NOT DETECTED NOT DETECTED Final   Norovirus GI/GII NOT DETECTED NOT DETECTED Final   Rotavirus A NOT DETECTED NOT DETECTED Final   Sapovirus (I, II, IV, and V) NOT DETECTED NOT DETECTED Final    Comment: Performed at [facility], [place] [place], [place] [address]  C. Diff by PCR, Reflexed     Status: Abnormal   Collection Time: 04/19/22  2:00 AM  Result Value Ref Range Status   Toxigenic C. Difficile by PCR POSITIVE (A) NEGATIVE Final    Comment: Positive for toxigenic C. difficile with little to no toxin production. Only treat if clinical presentation suggests symptomatic illness. Performed at [facility], [place] [address], [place],  [address]          Radiology Studies: No results found.      Scheduled Meds:  allopurinol  100 mg Oral Daily   ALPRAZolam  0.25 mg Oral QHS   amLODipine  10 mg Oral Daily   aspirin EC  81 mg Oral Daily   atorvastatin  80 mg Oral q1800   Chlorhexidine Gluconate Cloth  6 each Topical Daily   cloNIDine  0.2 mg Oral BID   fidaxomicin  200 mg Oral BID   folic acid  1 mg Oral Daily   furosemide  80 mg Oral Daily   And   furosemide  40 mg Oral QPM   hydrALAZINE  100 mg Oral TID   insulin aspart  0-5 Units Subcutaneous QHS   insulin aspart  0-9 Units Subcutaneous TID WC   insulin glargine-yfgn  9 Units Subcutaneous  Daily   lactose free nutrition  237 mL Oral TID WC   metoprolol succinate  12.5 mg Oral Daily   mirabegron ER  25 mg Oral Daily   pantoprazole  40 mg Oral Daily   predniSONE  20 mg Oral Q breakfast   spironolactone  25 mg Oral Daily   tadalafil  40 mg Oral QPM   warfarin  7.5 mg Oral ONCE-1600   Warfarin - Pharmacist Dosing Inpatient   Does not apply q1600   Continuous  Infusions:  heparin 900 Units/hr (04/22/22 2359)          [name], MD [facility] 04/23/2022, 10:50 AM

## 2022-04-23 NOTE — Plan of Care (Signed)
No acute events overnight.    Problem: Education: Goal: Knowledge of General Education information will improve Description: Including pain rating scale, medication(s)/side effects and non-pharmacologic comfort measures Outcome: Progressing   Problem: Health Behavior/Discharge Planning: Goal: Ability to manage health-related needs will improve Outcome: Progressing   Problem: Clinical Measurements: Goal: Ability to maintain clinical measurements within normal limits will improve Outcome: Progressing Goal: Will remain free from infection Outcome: Progressing Goal: Diagnostic test results will improve Outcome: Progressing Goal: Respiratory complications will improve Outcome: Progressing Goal: Cardiovascular complication will be avoided Outcome: Progressing   Problem: Activity: Goal: Risk for activity intolerance will decrease Outcome: Progressing   Problem: Nutrition: Goal: Adequate nutrition will be maintained Outcome: Progressing   Problem: Coping: Goal: Level of anxiety will decrease Outcome: Progressing   Problem: Elimination: Goal: Will not experience complications related to bowel motility Outcome: Progressing Goal: Will not experience complications related to urinary retention Outcome: Progressing   Problem: Pain Managment: Goal: General experience of comfort will improve Outcome: Progressing   Problem: Safety: Goal: Ability to remain free from injury will improve Outcome: Progressing   Problem: Skin Integrity: Goal: Risk for impaired skin integrity will decrease Outcome: Progressing   Problem: Education: Goal: Ability to describe self-care measures that may prevent or decrease complications (Diabetes Survival Skills Education) will improve Outcome: Progressing Goal: Individualized Educational Video(s) Outcome: Progressing   Problem: Coping: Goal: Ability to adjust to condition or change in health will improve Outcome: Progressing   Problem: Fluid  Volume: Goal: Ability to maintain a balanced intake and output will improve Outcome: Progressing   Problem: Health Behavior/Discharge Planning: Goal: Ability to identify and utilize available resources and services will improve Outcome: Progressing Goal: Ability to manage health-related needs will improve Outcome: Progressing   Problem: Metabolic: Goal: Ability to maintain appropriate glucose levels will improve Outcome: Progressing   Problem: Nutritional: Goal: Maintenance of adequate nutrition will improve Outcome: Progressing Goal: Progress toward achieving an optimal weight will improve Outcome: Progressing   Problem: Skin Integrity: Goal: Risk for impaired skin integrity will decrease Outcome: Progressing   Problem: Tissue Perfusion: Goal: Adequacy of tissue perfusion will improve Outcome: Progressing   

## 2022-04-23 NOTE — Progress Notes (Signed)
ANTICOAGULATION CONSULT NOTE - Follow Up Consult  Pharmacy Consult for warfarin/ IV heparin  Indication: PAF, mechanical AVR  Allergies  Allergen Reactions   Flagyl [Metronidazole] Rash and Other (See Comments)    ALL-OVER BODY RASH   Ciprofloxacin Nausea Only and Other (See Comments)    Stomach ache   Coreg [Carvedilol] Other (See Comments)    Terrible cramping in the feet and had a lot of bowel movements, but not diarrhea   Diflucan [Fluconazole] Diarrhea   Losartan Swelling and Other (See Comments)    Patient doesn't recall site of swelling   Privigen [Immune Globulin (Human)] Other (See Comments)    Severe/excruciating pain   Sulfamethoxazole-Trimethoprim Nausea Only and Other (See Comments)    Stomach upset   Verapamil Hives   Zetia [Ezetimibe] Other (See Comments)    Reaction not recalled   Zocor [Simvastatin - High Dose] Other (See Comments)    Reaction not recalled   Gatifloxacin Rash and Other (See Comments)    Redness to skin around eye    Patient Measurements: Height: '4\' 11"'$  (149.9 cm) Weight: 59.4 kg (131 lb) IBW/kg (Calculated) : 43.2   Vital Signs: Temp: 97.6 F (36.4 C) (07/16 0625) Temp Source: Oral (07/16 0625) BP: 156/56 (07/16 0625) Pulse Rate: 60 (07/16 0625)  Labs: Recent Labs    04/21/22 0534 04/21/22 1524 04/22/22 0530 04/22/22 2239 04/23/22 0813 04/23/22 0831  HGB 7.7* 9.4* 9.1*  --   --  10.5*  HCT 23.9* 28.3* 27.9*  --   --  32.0*  PLT 116*  --  121*  --   --  157  LABPROT 18.4*  --  13.3  --  13.9  --   INR 1.6*  --  1.0  --  1.1  --   HEPARINUNFRC  --   --   --  0.31 0.39  --   CREATININE 2.49*  --  2.31*  --   --   --      Estimated Creatinine Clearance: 14.5 mL/min (A) (by C-G formula based on SCr of 2.31 mg/dL (H)).   Medications:  - Warfarin 5 mg tabs: 7.5 mg after supper on Sunday, Wednesday, and Friday. 5 mg on Monday, Tuesday, Thursday, and Saturday.   Assessment: 50 YOWF admitted for persistent diverticulitis. w/  hx of PAF and mechanical AVR on warfarin PTA, CAD (CABG 2005), PAH, CHF, HTN, DM, vasculitis, and autoimmune hemolytic anemia. Baseline INR supratherapeutic.  Pharmacy consulted to dose warfarin.  Today, 04/21/22 - INR today 1.1, subtherapeutic, warfarin resumed last night - CBC low, but stable, Hgb trending up -  No noted bleeding or line issues  - HL is 0.39, therapeutic - Patient has diet order, is consuming 75-95% of meals - Last warfarin dose on 04/17/22, patient refused dose on 04/20/22 and dose of warfarin was held on 7/14     Goal of Therapy:  INR 2-3 Monitor platelets by anticoagulation protocol: Yes   Plan:  - Continue  heparin drip at  900 units/hr - Heparin no bolus as per consult  - Warfarin 7.5 mg PO x 1  - Obtain HL with AM labs - Daily INR and CBC - Monitor for signs of bleeding/thrombosis   Royetta Asal, PharmD, BCPS 04/23/2022 9:38 AM

## 2022-04-24 ENCOUNTER — Ambulatory Visit: Payer: Self-pay

## 2022-04-24 ENCOUNTER — Inpatient Hospital Stay: Payer: Medicare HMO

## 2022-04-24 DIAGNOSIS — A0472 Enterocolitis due to Clostridium difficile, not specified as recurrent: Secondary | ICD-10-CM | POA: Diagnosis not present

## 2022-04-24 DIAGNOSIS — D591 Autoimmune hemolytic anemia, unspecified: Secondary | ICD-10-CM | POA: Diagnosis not present

## 2022-04-24 DIAGNOSIS — Z7901 Long term (current) use of anticoagulants: Secondary | ICD-10-CM | POA: Diagnosis not present

## 2022-04-24 DIAGNOSIS — I5032 Chronic diastolic (congestive) heart failure: Secondary | ICD-10-CM | POA: Diagnosis not present

## 2022-04-24 DIAGNOSIS — E119 Type 2 diabetes mellitus without complications: Secondary | ICD-10-CM

## 2022-04-24 LAB — CBC WITH DIFFERENTIAL/PLATELET
Abs Immature Granulocytes: 0.06 10*3/uL (ref 0.00–0.07)
Basophils Absolute: 0 10*3/uL (ref 0.0–0.1)
Basophils Relative: 0 %
Eosinophils Absolute: 0 10*3/uL (ref 0.0–0.5)
Eosinophils Relative: 1 %
HCT: 28.1 % — ABNORMAL LOW (ref 36.0–46.0)
Hemoglobin: 9.3 g/dL — ABNORMAL LOW (ref 12.0–15.0)
Immature Granulocytes: 1 %
Lymphocytes Relative: 11 %
Lymphs Abs: 0.5 10*3/uL — ABNORMAL LOW (ref 0.7–4.0)
MCH: 30.2 pg (ref 26.0–34.0)
MCHC: 33.1 g/dL (ref 30.0–36.0)
MCV: 91.2 fL (ref 80.0–100.0)
Monocytes Absolute: 0.3 10*3/uL (ref 0.1–1.0)
Monocytes Relative: 7 %
Neutro Abs: 3.5 10*3/uL (ref 1.7–7.7)
Neutrophils Relative %: 80 %
Platelets: 123 10*3/uL — ABNORMAL LOW (ref 150–400)
RBC: 3.08 MIL/uL — ABNORMAL LOW (ref 3.87–5.11)
RDW: 15.7 % — ABNORMAL HIGH (ref 11.5–15.5)
WBC: 4.4 10*3/uL (ref 4.0–10.5)
nRBC: 0 % (ref 0.0–0.2)

## 2022-04-24 LAB — BASIC METABOLIC PANEL
Anion gap: 9 (ref 5–15)
BUN: 80 mg/dL — ABNORMAL HIGH (ref 8–23)
CO2: 21 mmol/L — ABNORMAL LOW (ref 22–32)
Calcium: 8.5 mg/dL — ABNORMAL LOW (ref 8.9–10.3)
Chloride: 102 mmol/L (ref 98–111)
Creatinine, Ser: 2.24 mg/dL — ABNORMAL HIGH (ref 0.44–1.00)
GFR, Estimated: 21 mL/min — ABNORMAL LOW (ref >60)
Glucose, Bld: 218 mg/dL — ABNORMAL HIGH (ref 70–99)
Potassium: 4.4 mmol/L (ref 3.5–5.1)
Sodium: 132 mmol/L — ABNORMAL LOW (ref 135–145)

## 2022-04-24 LAB — GLUCOSE, CAPILLARY
Glucose-Capillary: 312 mg/dL — ABNORMAL HIGH (ref 70–99)
Glucose-Capillary: 182 mg/dL — ABNORMAL HIGH (ref 70–99)
Glucose-Capillary: 239 mg/dL — ABNORMAL HIGH (ref 70–99)
Glucose-Capillary: 355 mg/dL — ABNORMAL HIGH (ref 70–99)
Glucose-Capillary: 380 mg/dL — ABNORMAL HIGH (ref 70–99)

## 2022-04-24 LAB — HEPARIN LEVEL (UNFRACTIONATED): Heparin Unfractionated: 0.49 IU/mL (ref 0.30–0.70)

## 2022-04-24 LAB — MAGNESIUM: Magnesium: 2 mg/dL (ref 1.7–2.4)

## 2022-04-24 LAB — PROTIME-INR
INR: 1.2 (ref 0.8–1.2)
Prothrombin Time: 14.9 seconds (ref 11.4–15.2)

## 2022-04-24 MED ORDER — WARFARIN SODIUM 5 MG PO TABS
5.0000 mg | ORAL_TABLET | Freq: Once | ORAL | Status: AC
  Administered 2022-04-24: 5 mg via ORAL
  Filled 2022-04-24: qty 1

## 2022-04-24 MED ORDER — INSULIN GLARGINE-YFGN 100 UNIT/ML ~~LOC~~ SOLN
12.0000 [IU] | Freq: Every day | SUBCUTANEOUS | Status: DC
  Administered 2022-04-25 – 2022-05-02 (×8): 12 [IU] via SUBCUTANEOUS
  Filled 2022-04-24 (×8): qty 0.12

## 2022-04-24 NOTE — Plan of Care (Signed)
  Problem: Clinical Measurements: Goal: Will remain free from infection Outcome: Progressing   Problem: Clinical Measurements: Goal: Diagnostic test results will improve Outcome: Progressing   Problem: Activity: Goal: Risk for activity intolerance will decrease Outcome: Progressing   Problem: Nutrition: Goal: Adequate nutrition will be maintained Outcome: Progressing   

## 2022-04-24 NOTE — Care Management Important Message (Signed)
Important Message  Patient Details IM Letter given to the Patient. Name: [name] MRN: [id number] Date of Birth: Feb 17, 1941   Medicare Important Message Given:  Yes     Kerin Salen 04/24/2022, 11:45 AM

## 2022-04-24 NOTE — TOC Transition Note (Signed)
Transition of Care [facility]) - CM/SW Discharge Note   Patient Details  Name: [name] MRN: [id number] Date of Birth: 27-Aug-1941  Transition of Care [facility]) CM/SW Contact:  Vassie Moselle, LCSW Phone Number: 04/24/2022, 11:36 AM   Clinical Narrative:    Pt already established with [facility] for [facility] services and will continue HHPT at discharge.    Final next level of care: Home w Home Health Services Barriers to Discharge: No Barriers Identified   Patient Goals and CMS Choice Patient states their goals for this hospitalization and ongoing recovery are:: To return home   Choice offered to / list presented to : Patient  Discharge Placement                       Discharge Plan and Services                DME Arranged: [place] rolling DME Agency: [facility] [employer] Date DME Agency Contacted: 04/20/22     HH Arranged: PT [place] Agency: [place] Date [place]: 04/24/22 Time [place]: [id number] Representative spoke with at [place]: [place] ([place]) Interventions     Readmission Risk Interventions    04/20/2022   12:02 PM  Readmission Risk Prevention Plan  Transportation Screening Complete  Medication Review Press photographer) Complete  PCP or Specialist appointment within 3-5 days of discharge Complete  HRI or [place] Complete  SW Recovery Care/Counseling Consult Complete  [place] Not Applicable

## 2022-04-24 NOTE — Progress Notes (Signed)
PROGRESS NOTE    [name]  VPX:[id number] DOB: 04-22-1941 DOA: 04/18/2022 PCP: [name], MD   Brief Narrative:  81 y.o. female with medical history significant of chronic diastolic HF, autoimmune hemolytic anemia, DM2, HLD, HTN, vasculitis, PAF, recent admission from 04/12/2022-04/16/2022 for emphysematous cystitis (colovesical fistula ruled out) treated with IV antibiotics and subsequent discharge on oral Augmentin presented with worsening vomiting and diarrhea with positive fevers.  On presentation, creatinine was 2.56 with WBC of 11.7.  CT of abdomen and pelvis showed possible sigmoid diverticulitis versus colitis.  She was started on broad-spectrum antibiotics.  She subsequently tested positive for C. difficile and was started on Dificid; antibiotics were discontinued.  Assessment & Plan:   Possible C. difficile colitis/diverticulitis -Stool for C. difficile tested positive.  Imaging as above.  Broad-spectrum antibiotics discontinued on 04/19/2022.  Started Dificid on 04/19/2022: Finish 10-day course of therapy.  Diarrhea has improved.  CKD stage IV -Creatinine at baseline.  Monitor  Leukocytosis -Resolved  Thrombocytopenia -monitor.  No signs of bleeding  Chronic diastolic CHF PAF on Coumadin PAH Supratherapeutic INR -Strict input and output.  Daily weights.  Fluid restriction.  Continue current regimen of Lasix along with hydralazine, Toprol-XL, spironolactone.  INR 1.2 today.  Repeat a.m. INR.  Continue Coumadin. Currently on heparin bridge as well.  Outpatient follow-up with cardiology. -Continue tadalafil  History of autoimmune hemolytic anemia History of vasculitis Anemia of chronic disease -Hematology had recommended in the past to transfuse if hemoglobin is less than 8.  Status post 1 unit packed red cell transfusion on 04/21/2022.  Hemoglobin 9.3 today.  Monitor H&H.  No evidence of hemolysis.  Outpatient follow-up with oncology.  Continue prednisone.  Discussed  with Dr. Iruku/hematology via secure chat on 04/21/2022 who recommended to hold mycophenolate while patient is getting treatment for C. difficile and to continue prednisone.  Recent emphysematous cystitis Acute urinary retention -CT on admission did not show evidence of cystitis.  DC'd antibiotics on 04/19/2022.  Currently has a Foley catheter and was supposed to follow-up with urology on 04/20/2022 for possible removal.  Communicated with Dr. Junious Silk on 04/20/2022 via secure chat who recommended to keep the Foley catheter in place and patient has follow-up appointment with Dr. Matilde Sprang on 04/27/2022 at 9:30 AM.  Hyponatremia -Mild.  Monitor  Hypertension -continue current regimen.  Physical deconditioning -PT recommends home health PT  Moderate malnutrition -Follow nutrition recommendations  Goals of care -Discussed goals of care and she is agreeable for DNR.  DVT prophylaxis: Coumadin and heparin Code Status: DNR Family Communication: None at bedside Disposition Plan: Status is: Inpatient Remains inpatient appropriate because: Of severity of illness. INR still subtherapeutic  Consultants: Contacted urology via secure chat on 04/20/2022.  Communicated with hematology/Dr. Chryl Heck via secure chat on 04/21/2022 Procedures: None  Antimicrobials:  Anti-infectives (From admission, onward)    Start     Dose/Rate Route Frequency Ordered Stop   04/19/22 1000  fidaxomicin (DIFICID) tablet 200 mg        200 mg Oral 2 times daily 04/19/22 0810 04/29/22 0959   04/18/22 2200  piperacillin-tazobactam (ZOSYN) IVPB 3.375 g  Status:  Discontinued        3.375 g 12.5 mL/hr over 240 Minutes Intravenous Every 12 hours 04/18/22 1528 04/19/22 0823   04/18/22 1530  piperacillin-tazobactam (ZOSYN) IVPB 3.375 g  Status:  Discontinued        3.375 g 100 mL/hr over 30 Minutes Intravenous  Once 04/18/22 1528 04/18/22 1544   04/18/22  1445  piperacillin-tazobactam (ZOSYN) IVPB 3.375 g        3.375 g 100  mL/hr over 30 Minutes Intravenous  Once 04/18/22 1431 04/18/22 1518        Subjective: Patient seen and examined at bedside.  Denies worsening shortness of breath or chest pain.  Denies any more rectal bleeding.  Feels weak but okay.   Objective: Vitals:   04/23/22 2051 04/23/22 2055 04/24/22 0354 04/24/22 0832  BP:  (!) 154/48 (!) 143/53 (!) 158/47  Pulse:  62 64 61  Resp: '19 18 20   '$ Temp:  97.7 F (36.5 C) 98.1 F (36.7 C)   TempSrc:  Oral    SpO2:  98% 98% 96%  Weight:      Height:        Intake/Output Summary (Last 24 hours) at 04/24/2022 1051 Last data filed at 04/24/2022 0835 Gross per 24 hour  Intake 1539.08 ml  Output 2175 ml  Net -635.92 ml    Filed Weights   04/18/22 1206  Weight: 59.4 kg    Examination:  General: On room air.  No distress.  Chronically ill and deconditioned looking ENT/neck: No JVD elevation or palpable neck masses noted  respiratory: Decreased breath sounds at bases bilaterally with some crackles CVS: S1 and S2 heard; currently rate controlled abdominal: Soft, nontender, still distended slightly; no organomegaly, normal bowel sounds heard  extremities: No cyanosis; mild lower extremity edema present  CNS: Awake and oriented.  No focal neurologic deficit.  She is able to move extremities lymph: No obvious palpable lymphadenopathy present  skin: No rashes/ulcers noted   psych: Appears pleasant.  Currently not agitated. Musculoskeletal: No obvious other joint swelling/deformity     Data Reviewed: I have personally reviewed following labs and imaging studies  CBC: Recent Labs  Lab 04/20/22 0537 04/21/22 0534 04/21/22 1524 04/22/22 0530 04/23/22 0831 04/24/22 0503  WBC 6.7 5.1  --  4.4 5.2 4.4  NEUTROABS 5.6 4.2  --  3.4 4.1 3.5  HGB 8.1* 7.7* 9.4* 9.1* 10.5* 9.3*  HCT 25.1* 23.9* 28.3* 27.9* 32.0* 28.1*  MCV 93.7 94.5  --  91.2 91.2 91.2  PLT 118* 116*  --  121* 157 123*    Basic Metabolic Panel: Recent Labs  Lab  04/20/22 0537 04/21/22 0534 04/22/22 0530 04/23/22 0831 04/24/22 0503  NA 135 134* 135 131* 132*  K 4.1 4.3 4.5 4.5 4.4  CL 108 106 105 103 102  CO2 19* 20* 22 18* 21*  GLUCOSE 204* 276* 162* 236* 218*  BUN 65* 75* 73* 71* 80*  CREATININE 2.36* 2.49* 2.31* 2.10* 2.24*  CALCIUM 7.6* 8.0* 8.2* 8.6* 8.5*  MG 2.1 2.1 2.1  --  2.0    GFR: Estimated Creatinine Clearance: 14.9 mL/min (A) (by C-G formula based on SCr of 2.24 mg/dL (H)). Liver Function Tests: Recent Labs  Lab 04/17/22 1241 04/18/22 1216 04/19/22 0859 04/20/22 0537  AST '28 31 29 24  '$ ALT '22 22 20 17  '$ ALKPHOS 47 53 55 48  BILITOT 0.7 0.8 1.0 0.4  PROT 5.2* 5.3* 5.1* 4.5*  ALBUMIN 2.9* 2.9* 2.6* 2.3*    No results for input(s): "LIPASE", "AMYLASE" in the last 168 hours. No results for input(s): "AMMONIA" in the last 168 hours. Coagulation Profile: Recent Labs  Lab 04/20/22 0537 04/21/22 0534 04/22/22 0530 04/23/22 0813 04/24/22 0503  INR 3.3* 1.6* 1.0 1.1 1.2    Cardiac Enzymes: No results for input(s): "CKTOTAL", "CKMB", "CKMBINDEX", "TROPONINI" in the last 168  hours. BNP (last 3 results) No results for input(s): "PROBNP" in the last 8760 hours. HbA1C: No results for input(s): "HGBA1C" in the last 72 hours. CBG: Recent Labs  Lab 04/23/22 0730 04/23/22 1150 04/23/22 1618 04/23/22 2020 04/24/22 0725  GLUCAP 206* 170* 333* 279* 182*    Lipid Profile: No results for input(s): "CHOL", "HDL", "LDLCALC", "TRIG", "CHOLHDL", "LDLDIRECT" in the last 72 hours. Thyroid Function Tests: No results for input(s): "TSH", "T4TOTAL", "FREET4", "T3FREE", "THYROIDAB" in the last 72 hours. Anemia Panel: No results for input(s): "VITAMINB12", "FOLATE", "FERRITIN", "TIBC", "IRON", "RETICCTPCT" in the last 72 hours. Sepsis Labs: Recent Labs  Lab 04/18/22 1216 04/18/22 1415  LATICACIDVEN 1.0 1.0     Recent Results (from the past 240 hour(s))  Urine Culture     Status: None   Collection Time: 04/14/22  1:29  PM   Specimen: Urine, Catheterized  Result Value Ref Range Status   Specimen Description   Final    URINE, CATHETERIZED Performed at [facility] [address], [place], [place] [address]    Special Requests   Final    NONE Performed at [facility], [place] [address], [place], [place] [address]    Culture   Final    NO GROWTH Performed at [facility], [place] [address] [place], [place] [address]    Report Status 04/15/2022 FINAL  Final  Culture, blood (Routine x 2)     Status: None   Collection Time: 04/18/22 12:16 PM   Specimen: BLOOD  Result Value Ref Range Status   Specimen Description   Final    BLOOD RIGHT ANTECUBITAL Performed at [facility] [address], [place], [place] [address]    Special Requests   Final    BOTTLES DRAWN AEROBIC AND ANAEROBIC Blood Culture results may not be optimal due to an excessive volume of blood received in culture bottles Performed at [facility] [address], [place], [place] [address]    Culture   Final    NO GROWTH 5 DAYS Performed at [facility], [place] [address] [place], [place] [address]    Report Status 04/23/2022 FINAL  Final  Culture, blood (Routine x 2)     Status: None   Collection Time: 04/18/22 12:26 PM   Specimen: BLOOD  Result Value Ref Range Status   Specimen Description   Final    BLOOD LEFT ANTECUBITAL Performed at [facility] [address], [place], [place] [address]    Special Requests   Final    BOTTLES DRAWN AEROBIC AND ANAEROBIC Blood Culture adequate volume Performed at [facility] [address] [place], [place] [address]    Culture   Final    NO GROWTH 5 DAYS Performed at [facility], [place] [address] [place], [place] [address]    Report Status 04/23/2022 FINAL  Final  Urine Culture     Status: None   Collection Time: 04/18/22 12:29 PM   Specimen: In/Out Cath  Urine  Result Value Ref Range Status   Specimen Description   Final    IN/OUT CATH URINE Performed at [facility] [address], [place], [place] [address]    Special Requests   Final    NONE Performed at [facility], [place] [address], [place], [place] [address]    Culture   Final    NO GROWTH Performed at [facility],  [address], [place], [place] [address]    Report Status 04/19/2022 FINAL  Final  C Difficile Quick Screen w PCR reflex     Status: Abnormal   Collection Time: 04/19/22  2:00 AM   Specimen: STOOL  Result Value Ref Range Status   C Diff antigen POSITIVE (A) NEGATIVE Final   C Diff toxin NEGATIVE NEGATIVE Final   C Diff interpretation Results are indeterminate. See PCR results.  Final    Comment: Performed at [facility] : [facility], [place] [address] [place], [place] [address]  Gastrointestinal Panel by PCR , Stool     Status: None   Collection Time: 04/19/22  2:00 AM   Specimen: Stool  Result Value Ref Range Status   Campylobacter species NOT DETECTED NOT DETECTED Final   Plesimonas shigelloides NOT DETECTED NOT DETECTED Final   Salmonella species NOT DETECTED NOT DETECTED Final   Yersinia enterocolitica NOT DETECTED NOT DETECTED Final   Vibrio species NOT DETECTED NOT DETECTED Final   Vibrio cholerae NOT DETECTED NOT DETECTED Final   Enteroaggregative E coli (EAEC) NOT DETECTED NOT DETECTED Final   Enteropathogenic E coli (EPEC) NOT DETECTED NOT DETECTED Final   Enterotoxigenic E coli (ETEC) NOT DETECTED NOT DETECTED Final   Shiga like toxin producing E coli (STEC) NOT DETECTED NOT DETECTED Final   Shigella/Enteroinvasive E coli (EIEC) NOT DETECTED NOT DETECTED Final   Cryptosporidium NOT DETECTED NOT DETECTED Final   Cyclospora cayetanensis NOT DETECTED NOT DETECTED Final   Entamoeba histolytica NOT DETECTED NOT DETECTED Final   Giardia lamblia NOT DETECTED NOT DETECTED Final   Adenovirus F40/41 NOT  DETECTED NOT DETECTED Final   Astrovirus NOT DETECTED NOT DETECTED Final   Norovirus GI/GII NOT DETECTED NOT DETECTED Final   Rotavirus A NOT DETECTED NOT DETECTED Final   Sapovirus (I, II, IV, and V) NOT DETECTED NOT DETECTED Final    Comment: Performed at [facility], [place] [place], [place] [address]  C. Diff by PCR, Reflexed     Status: Abnormal   Collection Time: 04/19/22  2:00 AM  Result Value Ref Range Status   Toxigenic C. Difficile by PCR POSITIVE (A) NEGATIVE Final    Comment: Positive for toxigenic C. difficile with little to no toxin production. Only treat if clinical presentation suggests symptomatic illness. Performed at [facility], [place] [address], [place], [place] [address]          Radiology Studies: No results found.      Scheduled Meds:  allopurinol  100 mg Oral Daily   ALPRAZolam  0.25 mg Oral QHS   amLODipine  10 mg Oral Daily   aspirin EC  81 mg Oral Daily   atorvastatin  80 mg Oral q1800   Chlorhexidine Gluconate Cloth  6 each Topical Daily   cloNIDine  0.2 mg Oral BID   fidaxomicin  200 mg Oral BID   folic acid  1 mg Oral Daily   furosemide  80 mg Oral Daily   And   furosemide  40 mg Oral QPM   hydrALAZINE  100 mg Oral TID   insulin aspart  0-5 Units Subcutaneous QHS   insulin aspart  0-9 Units Subcutaneous TID WC   insulin glargine-yfgn  9 Units Subcutaneous Daily   lactose free nutrition  237 mL Oral TID WC   metoprolol succinate  12.5 mg Oral Daily   mirabegron ER  25 mg Oral Daily   pantoprazole  40 mg Oral Daily  predniSONE  20 mg Oral Q breakfast   spironolactone  25 mg Oral Daily   tadalafil  40 mg Oral QPM   Warfarin - Pharmacist Dosing Inpatient   Does not apply q1600   Continuous Infusions:  heparin 900 Units/hr (04/24/22 0513)          [name], MD [facility] 04/24/2022, 10:51 AM

## 2022-04-24 NOTE — Progress Notes (Signed)
ANTICOAGULATION CONSULT NOTE - Follow Up Consult  Pharmacy Consult for warfarin/ IV heparin Indication: Mechanical AVR, PAF  Allergies  Allergen Reactions   Flagyl [Metronidazole] Rash and Other (See Comments)    ALL-OVER BODY RASH   Ciprofloxacin Nausea Only and Other (See Comments)    Stomach ache   Coreg [Carvedilol] Other (See Comments)    Terrible cramping in the feet and had a lot of bowel movements, but not diarrhea   Diflucan [Fluconazole] Diarrhea   Losartan Swelling and Other (See Comments)    Patient doesn't recall site of swelling   Privigen [Immune Globulin (Human)] Other (See Comments)    Severe/excruciating pain   Sulfamethoxazole-Trimethoprim Nausea Only and Other (See Comments)    Stomach upset   Verapamil Hives   Zetia [Ezetimibe] Other (See Comments)    Reaction not recalled   Zocor [Simvastatin - High Dose] Other (See Comments)    Reaction not recalled   Gatifloxacin Rash and Other (See Comments)    Redness to skin around eye    Patient Measurements: Height: '4\' 11"'$  (149.9 cm) Weight: 59.4 kg (131 lb) IBW/kg (Calculated) : 43.2 Heparin Dosing Weight: 59.4 kg  Vital Signs: Temp: 98.1 F (36.7 C) (07/17 0354) Temp Source: Oral (07/16 2055) BP: 158/47 (07/17 0832) Pulse Rate: 61 (07/17 0832)  Labs: Recent Labs    04/22/22 0530 04/22/22 2239 04/23/22 0813 04/23/22 0831 04/24/22 0503  HGB 9.1*  --   --  10.5* 9.3*  HCT 27.9*  --   --  32.0* 28.1*  PLT 121*  --   --  157 123*  LABPROT 13.3  --  13.9  --  14.9  INR 1.0  --  1.1  --  1.2  HEPARINUNFRC  --  0.31 0.39  --  0.49  CREATININE 2.31*  --   --  2.10* 2.24*    Estimated Creatinine Clearance: 14.9 mL/min (A) (by C-G formula based on SCr of 2.24 mg/dL (H)).   Medications:  -  Warfarin 5 mg tabs: 7.5 mg after supper on Sunday, Wednesday, and Friday. 5 mg on Monday, Tuesday, Thursday, and Saturday  Assessment: 23 YOWF admitted for persistent diverticulitis. w/ hx of PAF and mechanical  AVR on warfarin PTA, CAD (CABG 2005), PAH, CHF, HTN, DM, vasculitis, and autoimmune hemolytic anemia. Baseline INR supratherapeutic.  Pharmacy consulted to dose warfarin/heparin.  Today, 04/24/22 - INR 1.2, subtherapeutic -  HL 0.49, therapeutic  - CBC low, but stable - No signs of bleeding per RN - Patient has diet order, consuming 100% of meals - Allopurinol may increase sensitivity to warfarin, patient maintained on PTA regimen   Goal of Therapy:  INR 2-3 Heparin level 0.3-0.7 units/ml Monitor platelets by anticoagulation protocol: Yes   Plan:  - Continue heparin 900 units/hr - Give warfarin 5 mg tab PO once - Daily heparin level, INR, CBC - Monitor for signs of bleeding/thrombosis [place] Pall 04/24/2022,8:49 AM

## 2022-04-24 NOTE — Progress Notes (Signed)
Physical Therapy Treatment Patient Details Name: [name] MRN: [id number] DOB: 1941/05/14 Today's Date: 04/24/2022   History of Present Illness anemia, DM2, HLD, HTN, vasculitis, PAF, recent admission from 04/12/2022-04/16/2022 for emphysematous cystitis (colovesical fistula ruled out) treated with IV antibiotics and subsequent discharge on oral Augmentin presented with worsening vomiting and diarrhea with positive fevers. Dx of c-diff.    PT Comments    Nurse okayed ambulation.  Pt did well with rollator.  Cont to recommend HHPT.   Recommendations for follow up therapy are one component of a multi-disciplinary discharge planning process, led by the attending physician.  Recommendations may be updated based on patient status, additional functional criteria and insurance authorization.  Follow Up Recommendations  Home health PT     Assistance Recommended at Discharge Intermittent Supervision/Assistance  Patient can return home with the following Assist for transportation;Assistance with cooking/housework   Equipment Recommendations  Rollator (4 wheels)    Recommendations for Other Services       Precautions / Restrictions Precautions Precautions: Fall Restrictions Weight Bearing Restrictions: No     Mobility  Bed Mobility Overal bed mobility: Modified Independent                  Transfers Overall transfer level: Needs assistance Equipment used: Rollator (4 wheels) Transfers: Sit to/from Stand Sit to Stand: Supervision                Ambulation/Gait Ambulation/Gait assistance: Min guard Gait Distance (Feet): 100 Feet Assistive device: Rollator (4 wheels) Gait Pattern/deviations: Step-through pattern Gait velocity: decr     General Gait Details: steady with rollator   Stairs             Wheelchair Mobility    Modified Rankin (Stroke Patients Only)       Balance Overall balance assessment: Needs assistance Sitting-balance support:  Feet supported Sitting balance-Leahy Scale: Good     Standing balance support: Reliant on assistive device for balance, During functional activity, Bilateral upper extremity supported Standing balance-Leahy Scale: Fair                              Cognition Arousal/Alertness: Awake/alert Behavior During Therapy: WFL for tasks assessed/performed Overall Cognitive Status: Within Functional Limits for tasks assessed                                          Exercises      General Comments        Pertinent Vitals/Pain Pain Assessment Pain Assessment: No/denies pain    Home Living                          Prior Function            PT Goals (current goals can now be found in the care plan section) Acute Rehab PT Goals PT Goal Formulation: With patient Time For Goal Achievement: 05/04/22 Potential to Achieve Goals: Good Progress towards PT goals: Progressing toward goals    Frequency    Min 3X/week      PT Plan Current plan remains appropriate    Co-evaluation              AM-PAC PT "6 Clicks" Mobility   Outcome Measure  Help needed turning from your back to your side while in  a flat bed without using bedrails?: None Help needed moving from lying on your back to sitting on the side of a flat bed without using bedrails?: A Little Help needed moving to and from a bed to a chair (including a wheelchair)?: None Help needed standing up from a chair using your arms (e.g., wheelchair or bedside chair)?: None Help needed to walk in hospital room?: A Little Help needed climbing 3-5 steps with a railing? : A Little 6 Click Score: 21    End of Session   Activity Tolerance: Patient tolerated treatment well Patient left: in bed;with call bell/phone within reach Nurse Communication: Mobility status PT Visit Diagnosis: Unsteadiness on feet (R26.81);Difficulty in walking, not elsewhere classified (R26.2);Muscle weakness  (generalized) (M62.81)     Time: 7062-3762 PT Time Calculation (min) (ACUTE ONLY): 17 min  Charges:  $Gait Training: 8-22 mins                     .Santiago Glad L. Tamala Julian, [place]  04/24/2022   Galen Manila 04/24/2022, 12:48 PM

## 2022-04-25 ENCOUNTER — Inpatient Hospital Stay (HOSPITAL_COMMUNITY): Payer: Medicare HMO

## 2022-04-25 DIAGNOSIS — E7800 Pure hypercholesterolemia, unspecified: Secondary | ICD-10-CM

## 2022-04-25 DIAGNOSIS — R233 Spontaneous ecchymoses: Secondary | ICD-10-CM

## 2022-04-25 DIAGNOSIS — A0472 Enterocolitis due to Clostridium difficile, not specified as recurrent: Secondary | ICD-10-CM | POA: Diagnosis not present

## 2022-04-25 DIAGNOSIS — N184 Chronic kidney disease, stage 4 (severe): Secondary | ICD-10-CM | POA: Diagnosis not present

## 2022-04-25 DIAGNOSIS — I5032 Chronic diastolic (congestive) heart failure: Secondary | ICD-10-CM | POA: Diagnosis not present

## 2022-04-25 DIAGNOSIS — Z7901 Long term (current) use of anticoagulants: Secondary | ICD-10-CM | POA: Diagnosis not present

## 2022-04-25 LAB — CBC WITH DIFFERENTIAL/PLATELET
Abs Immature Granulocytes: 0.13 10*3/uL — ABNORMAL HIGH (ref 0.00–0.07)
Basophils Absolute: 0 10*3/uL (ref 0.0–0.1)
Basophils Relative: 1 %
Eosinophils Absolute: 0 10*3/uL (ref 0.0–0.5)
Eosinophils Relative: 1 %
HCT: 28.5 % — ABNORMAL LOW (ref 36.0–46.0)
Hemoglobin: 9.3 g/dL — ABNORMAL LOW (ref 12.0–15.0)
Immature Granulocytes: 4 %
Lymphocytes Relative: 12 %
Lymphs Abs: 0.5 10*3/uL — ABNORMAL LOW (ref 0.7–4.0)
MCH: 30.1 pg (ref 26.0–34.0)
MCHC: 32.6 g/dL (ref 30.0–36.0)
MCV: 92.2 fL (ref 80.0–100.0)
Monocytes Absolute: 0.2 10*3/uL (ref 0.1–1.0)
Monocytes Relative: 7 %
Neutro Abs: 2.8 10*3/uL (ref 1.7–7.7)
Neutrophils Relative %: 75 %
Platelets: 128 10*3/uL — ABNORMAL LOW (ref 150–400)
RBC: 3.09 MIL/uL — ABNORMAL LOW (ref 3.87–5.11)
RDW: 15.8 % — ABNORMAL HIGH (ref 11.5–15.5)
WBC: 3.7 10*3/uL — ABNORMAL LOW (ref 4.0–10.5)
nRBC: 0 % (ref 0.0–0.2)

## 2022-04-25 LAB — BASIC METABOLIC PANEL
Anion gap: 8 (ref 5–15)
BUN: 84 mg/dL — ABNORMAL HIGH (ref 8–23)
CO2: 21 mmol/L — ABNORMAL LOW (ref 22–32)
Calcium: 8.5 mg/dL — ABNORMAL LOW (ref 8.9–10.3)
Chloride: 103 mmol/L (ref 98–111)
Creatinine, Ser: 2.12 mg/dL — ABNORMAL HIGH (ref 0.44–1.00)
GFR, Estimated: 23 mL/min — ABNORMAL LOW (ref >60)
Glucose, Bld: 188 mg/dL — ABNORMAL HIGH (ref 70–99)
Potassium: 4.6 mmol/L (ref 3.5–5.1)
Sodium: 132 mmol/L — ABNORMAL LOW (ref 135–145)

## 2022-04-25 LAB — PROTIME-INR
INR: 1.4 — ABNORMAL HIGH (ref 0.8–1.2)
Prothrombin Time: 16.7 seconds — ABNORMAL HIGH (ref 11.4–15.2)

## 2022-04-25 LAB — MAGNESIUM: Magnesium: 1.7 mg/dL (ref 1.7–2.4)

## 2022-04-25 LAB — GLUCOSE, CAPILLARY
Glucose-Capillary: 157 mg/dL — ABNORMAL HIGH (ref 70–99)
Glucose-Capillary: 221 mg/dL — ABNORMAL HIGH (ref 70–99)
Glucose-Capillary: 342 mg/dL — ABNORMAL HIGH (ref 70–99)
Glucose-Capillary: 280 mg/dL — ABNORMAL HIGH (ref 70–99)

## 2022-04-25 LAB — HEPARIN LEVEL (UNFRACTIONATED): Heparin Unfractionated: 0.44 IU/mL (ref 0.30–0.70)

## 2022-04-25 MED ORDER — INSULIN ASPART 100 UNIT/ML IJ SOLN
3.0000 [IU] | Freq: Three times a day (TID) | INTRAMUSCULAR | Status: DC
  Administered 2022-04-25 – 2022-05-02 (×21): 3 [IU] via SUBCUTANEOUS

## 2022-04-25 MED ORDER — TRAZODONE HCL 50 MG PO TABS
50.0000 mg | ORAL_TABLET | Freq: Every evening | ORAL | Status: DC | PRN
Start: 2022-04-25 — End: 2022-05-02
  Administered 2022-04-25 – 2022-04-26 (×2): 50 mg via ORAL
  Filled 2022-04-25 (×2): qty 1

## 2022-04-25 MED ORDER — BOOST PLUS PO LIQD
237.0000 mL | ORAL | Status: DC
  Administered 2022-04-29 – 2022-04-30 (×2): 237 mL via ORAL
  Filled 2022-04-25 (×7): qty 237

## 2022-04-25 MED ORDER — ALUM & MAG HYDROXIDE-SIMETH 200-200-20 MG/5ML PO SUSP
30.0000 mL | ORAL | Status: DC | PRN
  Administered 2022-04-25 – 2022-04-29 (×2): 30 mL via ORAL
  Filled 2022-04-25 (×2): qty 30

## 2022-04-25 MED ORDER — WARFARIN SODIUM 5 MG PO TABS: 5.0000 mg | ORAL_TABLET | Freq: Once | ORAL | Status: DC

## 2022-04-25 NOTE — Progress Notes (Signed)
PROGRESS NOTE    [name]  CNO:[id number] DOB: 03/07/1941 DOA: 04/18/2022 PCP: [name], MD   Brief Narrative:  81 y.o. female with medical history significant of chronic diastolic HF, autoimmune hemolytic anemia, DM2, HLD, HTN, vasculitis, PAF, recent admission from 04/12/2022-04/16/2022 for emphysematous cystitis (colovesical fistula ruled out) treated with IV antibiotics and subsequent discharge on oral Augmentin presented with worsening vomiting and diarrhea with positive fevers.  On presentation, creatinine was 2.56 with WBC of 11.7.  CT of abdomen and pelvis showed possible sigmoid diverticulitis versus colitis.  She was started on broad-spectrum antibiotics.  She subsequently tested positive for C. difficile and was started on Dificid; antibiotics were discontinued.  Diarrhea is improving.  Currently on heparin bridge along with Coumadin because of subtherapeutic INR.  Assessment & Plan:   Possible C. difficile colitis/diverticulitis -Stool for C. difficile tested positive.  Imaging as above.  Broad-spectrum antibiotics discontinued on 04/19/2022.  Started Dificid on 04/19/2022: Finish 10-day course of therapy.  Diarrhea has improved.  CKD stage IV -Creatinine at baseline.  Monitor  Leukocytosis -Resolved  Thrombocytopenia -monitor.  No signs of bleeding  Chronic diastolic CHF PAF on Coumadin PAH Supratherapeutic INR -Strict input and output.  Daily weights.  Fluid restriction.  Continue current regimen of Lasix along with hydralazine, Toprol-XL, spironolactone.  INR 1.4 today.  Repeat a.m. INR.  Continue Coumadin. Currently on heparin bridge as well.  Outpatient follow-up with cardiology. -Continue tadalafil  History of autoimmune hemolytic anemia History of vasculitis Anemia of chronic disease -Hematology had recommended in the past to transfuse if hemoglobin is less than 8.  Status post 1 unit packed red cell transfusion on 04/21/2022.  Hemoglobin 9.3 today.   Monitor H&H.  No evidence of hemolysis.  Outpatient follow-up with oncology.  Continue prednisone.  Discussed with Dr. Iruku/hematology via secure chat on 04/21/2022 who recommended to hold mycophenolate while patient is getting treatment for C. difficile and to continue prednisone.  Recent emphysematous cystitis Acute urinary retention -CT on admission did not show evidence of cystitis.  DC'd antibiotics on 04/19/2022.  Currently has a Foley catheter and was supposed to follow-up with urology on 04/20/2022 for possible removal.  Communicated with Dr. Junious Silk on 04/20/2022 via secure chat who recommended to keep the Foley catheter in place and patient has follow-up appointment with Dr. Matilde Sprang on 04/27/2022 at 9:30 AM.  Might have to reengage with urology if patient is still inpatient on 04/27/2022.  Hyponatremia -Mild.  Monitor  Hypertension -continue current regimen.  Physical deconditioning -PT recommends home health PT  Moderate malnutrition -Follow nutrition recommendations   DVT prophylaxis: Coumadin and heparin Code Status: DNR Family Communication: None at bedside Disposition Plan: Status is: Inpatient Remains inpatient appropriate because: Of severity of illness. INR still subtherapeutic.  Possible discharge in 1 to 3 days once INR gets close to therapeutic range.  Consultants: Contacted urology via secure chat on 04/20/2022.  Communicated with hematology/Dr. Chryl Heck via secure chat on 04/21/2022 Procedures: None  Antimicrobials:  Anti-infectives (From admission, onward)    Start     Dose/Rate Route Frequency Ordered Stop   04/19/22 1000  fidaxomicin (DIFICID) tablet 200 mg        200 mg Oral 2 times daily 04/19/22 0810 04/29/22 0959   04/18/22 2200  piperacillin-tazobactam (ZOSYN) IVPB 3.375 g  Status:  Discontinued        3.375 g 12.5 mL/hr over 240 Minutes Intravenous Every 12 hours 04/18/22 1528 04/19/22 0823   04/18/22 1530  piperacillin-tazobactam (  ZOSYN) IVPB 3.375 g   Status:  Discontinued        3.375 g 100 mL/hr over 30 Minutes Intravenous  Once 04/18/22 1528 04/18/22 1544   04/18/22 1445  piperacillin-tazobactam (ZOSYN) IVPB 3.375 g        3.375 g 100 mL/hr over 30 Minutes Intravenous  Once 04/18/22 1431 04/18/22 1518        Subjective: Patient seen and examined at bedside.  Does not feel well this morning; feels weak.  Has not had much sleep.  Has intermittent abdominal pain.  No rectal bleeding reported.  Diarrhea has improved.   Objective: Vitals:   04/24/22 1421 04/24/22 2008 04/24/22 2111 04/25/22 0419  BP: (!) 142/42 (!) 180/45 (!) 139/51 (!) 144/69  Pulse: 64 79 75 65  Resp: '20 17 20 17  '$ Temp: 98.1 F (36.7 C) 97.6 F (36.4 C) 98 F (36.7 C) 97.8 F (36.6 C)  TempSrc: Oral Oral    SpO2: 99% 98% 98% 99%  Weight:      Height:        Intake/Output Summary (Last 24 hours) at 04/25/2022 0932 Last data filed at 04/24/2022 2039 Gross per 24 hour  Intake 808.99 ml  Output 700 ml  Net 108.99 ml    Filed Weights   04/18/22 1206  Weight: 59.4 kg    Examination:  General: No acute distress.  Still on room air.  Chronically ill and deconditioned looking ENT/neck: JVD is currently not elevated.  No neck masses or thyromegaly palpated.  Respiratory: Bilateral decreased breath sounds at bases with scattered crackles, no wheezing  CVS: Rate is mostly controlled; S1, S2 heard  abdominal: Soft, still has some distention with no tenderness; no organomegaly, bowel sounds heard extremities: Lower extremity edema present bilaterally; no cyanosis  CNS: Alert and awake.  No focal neurologic deficit.  Moving extremities  lymph: No lymph nodes palpable skin: Left upper arm ecchymosis present  psych: No signs of agitation.  Affect is mostly flat  musculoskeletal: No obvious other joint swelling/deformity     Data Reviewed: I have personally reviewed following labs and imaging studies  CBC: Recent Labs  Lab 04/21/22 0534 04/21/22 1524  04/22/22 0530 04/23/22 0831 04/24/22 0503 04/25/22 0547  WBC 5.1  --  4.4 5.2 4.4 3.7*  NEUTROABS 4.2  --  3.4 4.1 3.5 2.8  HGB 7.7* 9.4* 9.1* 10.5* 9.3* 9.3*  HCT 23.9* 28.3* 27.9* 32.0* 28.1* 28.5*  MCV 94.5  --  91.2 91.2 91.2 92.2  PLT 116*  --  121* 157 123* 128*    Basic Metabolic Panel: Recent Labs  Lab 04/20/22 0537 04/21/22 0534 04/22/22 0530 04/23/22 0831 04/24/22 0503 04/25/22 0547  NA 135 134* 135 131* 132* 132*  K 4.1 4.3 4.5 4.5 4.4 4.6  CL 108 106 105 103 102 103  CO2 19* 20* 22 18* 21* 21*  GLUCOSE 204* 276* 162* 236* 218* 188*  BUN 65* 75* 73* 71* 80* 84*  CREATININE 2.36* 2.49* 2.31* 2.10* 2.24* 2.12*  CALCIUM 7.6* 8.0* 8.2* 8.6* 8.5* 8.5*  MG 2.1 2.1 2.1  --  2.0 1.7    GFR: Estimated Creatinine Clearance: 15.8 mL/min (A) (by C-G formula based on SCr of 2.12 mg/dL (H)). Liver Function Tests: Recent Labs  Lab 04/18/22 1216 04/19/22 0859 04/20/22 0537  AST '31 29 24  '$ ALT '22 20 17  '$ ALKPHOS 53 55 48  BILITOT 0.8 1.0 0.4  PROT 5.3* 5.1* 4.5*  ALBUMIN 2.9* 2.6* 2.3*  No results for input(s): "LIPASE", "AMYLASE" in the last 168 hours. No results for input(s): "AMMONIA" in the last 168 hours. Coagulation Profile: Recent Labs  Lab 04/21/22 0534 04/22/22 0530 04/23/22 0813 04/24/22 0503 04/25/22 0547  INR 1.6* 1.0 1.1 1.2 1.4*    Cardiac Enzymes: No results for input(s): "CKTOTAL", "CKMB", "CKMBINDEX", "TROPONINI" in the last 168 hours. BNP (last 3 results) No results for input(s): "PROBNP" in the last 8760 hours. HbA1C: No results for input(s): "HGBA1C" in the last 72 hours. CBG: Recent Labs  Lab 04/23/22 2020 04/24/22 0725 04/24/22 1242 04/24/22 1610 04/24/22 2109  GLUCAP 279* 182* 239* 355* 380*    Lipid Profile: No results for input(s): "CHOL", "HDL", "LDLCALC", "TRIG", "CHOLHDL", "LDLDIRECT" in the last 72 hours. Thyroid Function Tests: No results for input(s): "TSH", "T4TOTAL", "FREET4", "T3FREE", "THYROIDAB" in the  last 72 hours. Anemia Panel: No results for input(s): "VITAMINB12", "FOLATE", "FERRITIN", "TIBC", "IRON", "RETICCTPCT" in the last 72 hours. Sepsis Labs: Recent Labs  Lab 04/18/22 1216 04/18/22 1415  LATICACIDVEN 1.0 1.0     Recent Results (from the past 240 hour(s))  Culture, blood (Routine x 2)     Status: None   Collection Time: 04/18/22 12:16 PM   Specimen: BLOOD  Result Value Ref Range Status   Specimen Description   Final    BLOOD RIGHT ANTECUBITAL Performed at [facility] [address] [place], [place] [address]    Special Requests   Final    BOTTLES DRAWN AEROBIC AND ANAEROBIC Blood Culture results may not be optimal due to an excessive volume of blood received in culture bottles Performed at [facility] [address], [place], [place] [address]    Culture   Final    NO GROWTH 5 DAYS Performed at [facility], [place] [address], [place], [place] [address]    Report Status 04/23/2022 FINAL  Final  Culture, blood (Routine x 2)     Status: None   Collection Time: 04/18/22 12:26 PM   Specimen: BLOOD  Result Value Ref Range Status   Specimen Description   Final    BLOOD LEFT ANTECUBITAL Performed at [facility] [address] [place], [place] [address]    Special Requests   Final    BOTTLES DRAWN AEROBIC AND ANAEROBIC Blood Culture adequate volume Performed at [facility] [address], [place], [place] [address]    Culture   Final    NO GROWTH 5 DAYS Performed at [facility], [place] [address], [place], [place] [address]    Report Status 04/23/2022 FINAL  Final  Urine Culture     Status: None   Collection Time: 04/18/22 12:29 PM   Specimen: In/Out Cath Urine  Result Value Ref Range Status   Specimen Description   Final    IN/OUT CATH URINE Performed at [facility] [address], [place], [place] [address]    Special Requests   Final     NONE Performed at [facility], [place] [address] [place], [place] [address]    Culture   Final    NO GROWTH Performed at [facility], [place] [address] [place],  [address]    Report Status 04/19/2022 FINAL  Final  C Difficile Quick Screen w PCR reflex     Status: Abnormal   Collection Time: 04/19/22  2:00 AM   Specimen: STOOL  Result Value Ref Range Status   C  Diff antigen POSITIVE (A) NEGATIVE Final   C Diff toxin NEGATIVE NEGATIVE Final   C Diff interpretation Results are indeterminate. See PCR results.  Final    Comment: Performed at [facility], [place] [address], [place], [place] [address]  Gastrointestinal Panel by PCR , Stool     Status: None   Collection Time: 04/19/22  2:00 AM   Specimen: Stool  Result Value Ref Range Status   Campylobacter species NOT DETECTED NOT DETECTED Final   Plesimonas shigelloides NOT DETECTED NOT DETECTED Final   Salmonella species NOT DETECTED NOT DETECTED Final   Yersinia enterocolitica NOT DETECTED NOT DETECTED Final   Vibrio species NOT DETECTED NOT DETECTED Final   Vibrio cholerae NOT DETECTED NOT DETECTED Final   Enteroaggregative E coli (EAEC) NOT DETECTED NOT DETECTED Final   Enteropathogenic E coli (EPEC) NOT DETECTED NOT DETECTED Final   Enterotoxigenic E coli (ETEC) NOT DETECTED NOT DETECTED Final   Shiga like toxin producing E coli (STEC) NOT DETECTED NOT DETECTED Final   Shigella/Enteroinvasive E coli (EIEC) NOT DETECTED NOT DETECTED Final   Cryptosporidium NOT DETECTED NOT DETECTED Final   Cyclospora cayetanensis NOT DETECTED NOT DETECTED Final   Entamoeba histolytica NOT DETECTED NOT DETECTED Final   Giardia lamblia NOT DETECTED NOT DETECTED Final   Adenovirus F40/41 NOT DETECTED NOT DETECTED Final   Astrovirus NOT DETECTED NOT DETECTED Final   Norovirus GI/GII NOT DETECTED NOT DETECTED Final   Rotavirus A NOT DETECTED NOT DETECTED Final   Sapovirus (I, II, IV, and V) NOT DETECTED  NOT DETECTED Final    Comment: Performed at [facility], [address] [place], [place] [address]  C. Diff by PCR, Reflexed     Status: Abnormal   Collection Time: 04/19/22  2:00 AM  Result Value Ref Range Status   Toxigenic C. Difficile by PCR POSITIVE (A) NEGATIVE Final    Comment: Positive for toxigenic C. difficile with little to no toxin production. Only treat if clinical presentation suggests symptomatic illness. Performed at [facility], [place] [address] [place], [place] [address]          Radiology Studies: No results found.      Scheduled Meds:  allopurinol  100 mg Oral Daily   ALPRAZolam  0.25 mg Oral QHS   amLODipine  10 mg Oral Daily   aspirin EC  81 mg Oral Daily   atorvastatin  80 mg Oral q1800   Chlorhexidine Gluconate Cloth  6 each Topical Daily   cloNIDine  0.2 mg Oral BID   fidaxomicin  200 mg Oral BID   folic acid  1 mg Oral Daily   furosemide  80 mg Oral Daily   And   furosemide  40 mg Oral QPM   hydrALAZINE  100 mg Oral TID   insulin aspart  0-5 Units Subcutaneous QHS   insulin aspart  0-9 Units Subcutaneous TID WC   insulin glargine-yfgn  12 Units Subcutaneous Daily   lactose free nutrition  237 mL Oral TID WC   metoprolol succinate  12.5 mg Oral Daily   mirabegron ER  25 mg Oral Daily   pantoprazole  40 mg Oral Daily   predniSONE  20 mg Oral Q breakfast   spironolactone  25 mg Oral Daily   tadalafil  40 mg Oral QPM   Warfarin - Pharmacist Dosing Inpatient   Does not apply q1600   Continuous Infusions:  heparin 900 Units/hr (04/24/22 2039)  [name], MD [facility] 04/25/2022, 9:32 AM

## 2022-04-25 NOTE — Consult Note (Signed)
   [facility] Inpatient Consult   04/25/2022  [name] 12-15-1940 [id number]  [facility] [ACO] Patient: [employer]  *Remote review coverage, patient at [facility] and readmission review requested  Primary Care Provider:  [name], [name], MD, [facility],  is an independent embedded provider with a Chronic Care Management team and program, and is listed for the transition of care follow up and appointments.  Patient has been assigned to a [facility] for care coordination.   Plan: Updates for [facility] for post hospital need will be sent. Readmission review information sent.  Please contact for further questions,  Natividad Brood, RN BSN [facility] Liaison  [phone] business mobile phone Toll free office [phone]  Fax number: [phone] [email or web address]

## 2022-04-25 NOTE — Progress Notes (Signed)
ANTICOAGULATION CONSULT NOTE - Follow Up Consult  Pharmacy Consult for warfarin/heparin Indication: Mechanical AVR, PAF  Allergies  Allergen Reactions   Flagyl [Metronidazole] Rash and Other (See Comments)    ALL-OVER BODY RASH   Ciprofloxacin Nausea Only and Other (See Comments)    Stomach ache   Coreg [Carvedilol] Other (See Comments)    Terrible cramping in the feet and had a lot of bowel movements, but not diarrhea   Diflucan [Fluconazole] Diarrhea   Losartan Swelling and Other (See Comments)    Patient doesn't recall site of swelling   Privigen [Immune Globulin (Human)] Other (See Comments)    Severe/excruciating pain   Sulfamethoxazole-Trimethoprim Nausea Only and Other (See Comments)    Stomach upset   Verapamil Hives   Zetia [Ezetimibe] Other (See Comments)    Reaction not recalled   Zocor [Simvastatin - High Dose] Other (See Comments)    Reaction not recalled   Gatifloxacin Rash and Other (See Comments)    Redness to skin around eye    Patient Measurements: Height: '4\' 11"'$  (149.9 cm) Weight: 59.4 kg (131 lb) IBW/kg (Calculated) : 43.2 Heparin Dosing Weight: 59.4 kg  Vital Signs: Temp: 97.8 F (36.6 C) (07/18 0419) BP: 144/69 (07/18 0419) Pulse Rate: 65 (07/18 0419)  Labs: Recent Labs    04/23/22 0813 04/23/22 0831 04/23/22 0831 04/24/22 0503 04/25/22 0547  HGB  --  10.5*   < > 9.3* 9.3*  HCT  --  32.0*  --  28.1* 28.5*  PLT  --  157  --  123* 128*  LABPROT 13.9  --   --  14.9 16.7*  INR 1.1  --   --  1.2 1.4*  HEPARINUNFRC 0.39  --   --  0.49 0.44  CREATININE  --  2.10*  --  2.24* 2.12*   < > = values in this interval not displayed.    Estimated Creatinine Clearance: 15.8 mL/min (A) (by C-G formula based on SCr of 2.12 mg/dL (H)).   Medications:  - Warfarin 5 mg tabs: 7.5 mg after supper on Sunday, Wednesday, and Friday. 5 mg on Monday, Tuesday, Thursday, and Saturday  Assessment: 52 YOWF admitted for persistent diverticulitis. w/ hx of PAF  and mechanical AVR on warfarin PTA, CAD (CABG 2005), PAH, CHF, HTN, DM, vasculitis, and autoimmune hemolytic anemia. Baseline INR supratherapeutic.  Pharmacy consulted to dose warfarin/heparin.  Today 04/25/22 - INR 1.4, subtherapeutic, up since 04/24/22 - HL 0.44, therapeutic - CBC low, but stable - No signs of bleed, but RN reported a 5cm x 5cm hematoma on the L arm (heparin infusing in R arm); RN is unsure how long hematoma has been present - Patient has diet order, eating 100% of meals - Allopurinol may increase warfarin sensitivity; the patient is on PTA regimen - Anticipating INR to increase after receiving an increased dose from home warfarin regimen on 7/15  Goal of Therapy:  INR 2-3 Heparin level 0.3-0.7 units/ml Monitor platelets by anticoagulation protocol: Yes   Plan:  - Continue heparin IV at 900 units/hr - Give warfarin 5 mg tab PO once - Daily HL, INR, CBC - Monitor for signs of bleed/thrombosis  Barnet Pall 04/25/2022,9:15 AM

## 2022-04-25 NOTE — Progress Notes (Signed)
Nutrition Follow-up  DOCUMENTATION CODES:   Non-severe (moderate) malnutrition in context of chronic illness  INTERVENTION:  Decrease frequency of Boost Plus po from TID to once daily, each supplement provides 360 kcal and 14 grams of protein 8 PM snack daily (bagel and yogurt with water) Order zinc lab given ongoing taste changes  NUTRITION DIAGNOSIS:   Moderate Malnutrition related to chronic illness (CHF) as evidenced by mild muscle depletion, energy intake < or equal to 75% for > or equal to 1 month.  ongoing  GOAL:   Patient will meet greater than or equal to 90% of their needs  Goal met  MONITOR:   Supplement acceptance, Weight trends, PO intake, I & O's, Labs, Skin  REASON FOR ASSESSMENT:   Malnutrition Screening Tool    ASSESSMENT:   81 y.o. female with medical history significant of chronic diastolic HF, autoimmune hemolytic anemia, DM2, HLD, HTN, vasculitis, PAF, recent admission from 04/12/2022-04/16/2022 for emphysematous cystitis (colovesical fistula ruled out) treated with IV antibiotics and subsequent discharge on oral Augmentin presented with worsening vomiting and diarrhea with positive fevers.  Spoke with pt at bedside. She reports eating well however has been ordering the same foods such as green beans and broccoli, as she is restricted to some foods that she wishes she could have such as coleslaw, and is beginning to get tired of these foods. She states that her family brought her a cheesesteak sub last night and has since had an upset stomach. She has only been able to drink 1 Boost daily as 2-3 is too much volume for her. She would like a nightly snack to hold her over through the night as she reports feeling hungry later into the evening.  Will adjust nutrition supplements and order nightly snack to aid in nutritional intake.  Documented meal completions of 100%  No updated wt recorded to review. 07/11: 59.4 kg (131 lbs) Will request updated  wt  Medications: fovlite, lasix, SSI 0-5 units qhs, SSI 0-9 units TID, SSI 3 units TID, semglee 12 units daily, protonix, prednisone, coumadin  Labs: sodium 132, BUN 84, Cr 2.12, GFR 23, CBG's 157-380 x24 hours  I/O's: -291m since admission  Diet Order:   Diet Order             Diet heart healthy/carb modified Room service appropriate? Yes; Fluid consistency: Thin; Fluid restriction: 1500 mL Fluid  Diet effective now                   EDUCATION NEEDS:   No education needs have been identified at this time  Skin:  Skin Assessment: Reviewed RN Assessment  Last BM:  7/18  Height:   Ht Readings from Last 1 Encounters:  04/18/22 _0  (1.499 m)    Weight:   Wt Readings from Last 1 Encounters:  04/18/22 59.4 kg   BMI:  Body mass index is 26.46 kg/m.  Estimated Nutritional Needs:   Kcal:  1600-1800  Protein:  75-90g  Fluid:  1.8L/day  AClayborne Dana RDN, LDN Clinical Nutrition

## 2022-04-25 NOTE — Progress Notes (Signed)
LUE venous duplex has been completed.   Results can be found under chart review under CV PROC. 04/25/2022 11:00 AM Lanier Millon RVT, RDMS

## 2022-04-26 ENCOUNTER — Ambulatory Visit: Payer: Self-pay

## 2022-04-26 DIAGNOSIS — Z7901 Long term (current) use of anticoagulants: Secondary | ICD-10-CM | POA: Diagnosis not present

## 2022-04-26 DIAGNOSIS — K529 Noninfective gastroenteritis and colitis, unspecified: Secondary | ICD-10-CM | POA: Diagnosis not present

## 2022-04-26 DIAGNOSIS — I5032 Chronic diastolic (congestive) heart failure: Secondary | ICD-10-CM | POA: Diagnosis not present

## 2022-04-26 DIAGNOSIS — D591 Autoimmune hemolytic anemia, unspecified: Secondary | ICD-10-CM | POA: Diagnosis not present

## 2022-04-26 LAB — CBC WITH DIFFERENTIAL/PLATELET
Abs Immature Granulocytes: 0.13 10*3/uL — ABNORMAL HIGH (ref 0.00–0.07)
Basophils Absolute: 0 10*3/uL (ref 0.0–0.1)
Basophils Relative: 1 %
Eosinophils Absolute: 0 10*3/uL (ref 0.0–0.5)
Eosinophils Relative: 1 %
HCT: 26.7 % — ABNORMAL LOW (ref 36.0–46.0)
Hemoglobin: 8.7 g/dL — ABNORMAL LOW (ref 12.0–15.0)
Immature Granulocytes: 4 %
Lymphocytes Relative: 15 %
Lymphs Abs: 0.5 10*3/uL — ABNORMAL LOW (ref 0.7–4.0)
MCH: 29.8 pg (ref 26.0–34.0)
MCHC: 32.6 g/dL (ref 30.0–36.0)
MCV: 91.4 fL (ref 80.0–100.0)
Monocytes Absolute: 0.3 10*3/uL (ref 0.1–1.0)
Monocytes Relative: 8 %
Neutro Abs: 2.5 10*3/uL (ref 1.7–7.7)
Neutrophils Relative %: 71 %
Platelets: 107 10*3/uL — ABNORMAL LOW (ref 150–400)
RBC: 2.92 MIL/uL — ABNORMAL LOW (ref 3.87–5.11)
RDW: 15.7 % — ABNORMAL HIGH (ref 11.5–15.5)
WBC: 3.4 10*3/uL — ABNORMAL LOW (ref 4.0–10.5)
nRBC: 0 % (ref 0.0–0.2)

## 2022-04-26 LAB — GLUCOSE, CAPILLARY
Glucose-Capillary: 213 mg/dL — ABNORMAL HIGH (ref 70–99)
Glucose-Capillary: 189 mg/dL — ABNORMAL HIGH (ref 70–99)
Glucose-Capillary: 232 mg/dL — ABNORMAL HIGH (ref 70–99)
Glucose-Capillary: 326 mg/dL — ABNORMAL HIGH (ref 70–99)

## 2022-04-26 LAB — MAGNESIUM: Magnesium: 1.8 mg/dL (ref 1.7–2.4)

## 2022-04-26 LAB — BASIC METABOLIC PANEL
Anion gap: 10 (ref 5–15)
BUN: 92 mg/dL — ABNORMAL HIGH (ref 8–23)
CO2: 21 mmol/L — ABNORMAL LOW (ref 22–32)
Calcium: 7.9 mg/dL — ABNORMAL LOW (ref 8.9–10.3)
Chloride: 96 mmol/L — ABNORMAL LOW (ref 98–111)
Creatinine, Ser: 2.1 mg/dL — ABNORMAL HIGH (ref 0.44–1.00)
GFR, Estimated: 23 mL/min — ABNORMAL LOW (ref >60)
Glucose, Bld: 279 mg/dL — ABNORMAL HIGH (ref 70–99)
Potassium: 4.4 mmol/L (ref 3.5–5.1)
Sodium: 127 mmol/L — ABNORMAL LOW (ref 135–145)

## 2022-04-26 LAB — PROTIME-INR
INR: 1.5 — ABNORMAL HIGH (ref 0.8–1.2)
Prothrombin Time: 17.7 seconds — ABNORMAL HIGH (ref 11.4–15.2)

## 2022-04-26 LAB — HEPARIN LEVEL (UNFRACTIONATED): Heparin Unfractionated: 0.47 IU/mL (ref 0.30–0.70)

## 2022-04-26 MED ORDER — WARFARIN SODIUM 5 MG PO TABS
10.0000 mg | ORAL_TABLET | Freq: Once | ORAL | Status: AC
Start: 2022-04-26 — End: 2022-04-26
  Administered 2022-04-26: 10 mg via ORAL
  Filled 2022-04-26: qty 2

## 2022-04-26 NOTE — Progress Notes (Addendum)
PROGRESS NOTE    [name]  YIR:[id number] DOB: [birth year] DOA: 04/18/2022 PCP: [name], MD   Brief Narrative:  81 y.o. female with medical history significant of chronic diastolic HF, autoimmune hemolytic anemia, DM2, HLD, HTN, vasculitis, PAF, recent admission from 04/12/2022-04/16/2022 for emphysematous cystitis (colovesical fistula ruled out) treated with IV antibiotics and subsequent discharge on oral Augmentin presented with worsening vomiting and diarrhea with positive fevers.  On presentation, creatinine was 2.56 with WBC of 11.7.  CT of abdomen and pelvis showed possible sigmoid diverticulitis versus colitis.  She was started on broad-spectrum antibiotics.  She subsequently tested positive for C. difficile and was started on Dificid; antibiotics were discontinued.  Diarrhea is improving.  Currently on heparin bridge along with Coumadin because of subtherapeutic INR.  Assessment & Plan:   Possible C. difficile colitis/diverticulitis -Stool for C. difficile tested positive.  Imaging as above.  Broad-spectrum antibiotics discontinued on 04/19/2022.  Started Dificid on 04/19/2022: Finish 10-day course of therapy.  Diarrhea has improved.  CKD stage IV -Creatinine at baseline.  Monitor  Leukocytosis -Resolved  Thrombocytopenia -monitor.  No signs of bleeding  Chronic diastolic CHF PAF on Coumadin PAH Supratherapeutic INR -Strict input and output.  Daily weights.  Fluid restriction.  Continue current regimen of Lasix along with hydralazine, Toprol-XL, spironolactone.  INR 1.5 today.  Repeat a.m. INR.  Continue Coumadin. Currently on heparin bridge as well.  Outpatient follow-up with cardiology. -Continue tadalafil  History of autoimmune hemolytic anemia History of vasculitis Anemia of chronic disease -Hematology had recommended in the past to transfuse if hemoglobin is less than 8.  Status post 1 unit packed red cell transfusion on 04/21/2022.  Hemoglobin 9.3 today.   Monitor H&H.  No evidence of hemolysis.  Outpatient follow-up with oncology.  Continue prednisone.  Discussed with Dr. Iruku/hematology via secure chat on 04/21/2022 who recommended to hold mycophenolate while patient is getting treatment for C. difficile and to continue prednisone.  Recent emphysematous cystitis Acute urinary retention -CT on admission did not show evidence of cystitis.  DC'd antibiotics on 04/19/2022.  Currently has a Foley catheter and was supposed to follow-up with urology on 04/20/2022 for possible removal.  Communicated with Dr. Junious Silk on 04/20/2022 via secure chat who recommended to keep the Foley catheter in place and patient has follow-up appointment with Dr. Matilde Sprang on 04/27/2022 at 9:30 AM.  Might have to reengage with urology if patient is still inpatient on 04/27/2022.  Hyponatremia -Mild.  Monitor  Hypertension -continue current regimen.  Physical deconditioning -PT recommends home health PT  Moderate malnutrition -Follow nutrition recommendations  DM2, uncontrolled with hyperglycemia -home dose of glipizide on hold -continue SSI and basal insulin  DVT prophylaxis: Coumadin and heparin Code Status: DNR Family Communication: None at bedside Disposition Plan: Status is: Inpatient Remains inpatient appropriate because: Of severity of illness. INR still subtherapeutic.  Possible discharge in 1 to 3 days once INR gets close to therapeutic range.  Consultants: Contacted urology via secure chat on 04/20/2022.  Communicated with hematology/Dr. Chryl Heck via secure chat on 04/21/2022 Procedures: None  Antimicrobials:  Anti-infectives (From admission, onward)    Start     Dose/Rate Route Frequency Ordered Stop   04/19/22 1000  fidaxomicin (DIFICID) tablet 200 mg        200 mg Oral 2 times daily 04/19/22 0810 04/29/22 0959   04/18/22 2200  piperacillin-tazobactam (ZOSYN) IVPB 3.375 g  Status:  Discontinued        3.375 g 12.5 mL/hr over 240  Minutes Intravenous  Every 12 hours 04/18/22 1528 04/19/22 0823   04/18/22 1530  piperacillin-tazobactam (ZOSYN) IVPB 3.375 g  Status:  Discontinued        3.375 g 100 mL/hr over 30 Minutes Intravenous  Once 04/18/22 1528 04/18/22 1544   04/18/22 1445  piperacillin-tazobactam (ZOSYN) IVPB 3.375 g        3.375 g 100 mL/hr over 30 Minutes Intravenous  Once 04/18/22 1431 04/18/22 1518        Subjective: Reports that diarrhea is improving.  She has only had 1 bowel movement today.  Still feels very weak.  Objective: Vitals:   04/25/22 2207 04/26/22 0500 04/26/22 0906 04/26/22 1427  BP: (!) 155/46 (!) 134/51 (!) 159/54 (!) 126/57  Pulse: 71 70 65 (!) 53  Resp: '18 20  16  '$ Temp: 97.7 F (36.5 C) 97.9 F (36.6 C)  (!) 97.4 F (36.3 C)  TempSrc: Oral Oral  Oral  SpO2: 98% 98%  99%  Weight:      Height:        Intake/Output Summary (Last 24 hours) at 04/26/2022 2043 Last data filed at 04/26/2022 1700 Gross per 24 hour  Intake 1080 ml  Output 1850 ml  Net -770 ml   Filed Weights   04/18/22 1206  Weight: 59.4 kg    Examination:  General: No acute distress.  Still on room air.  Chronically ill and deconditioned looking ENT/neck: JVD is currently not elevated.  No neck masses or thyromegaly palpated.  Respiratory: Bilateral decreased breath sounds at bases with scattered crackles, no wheezing  CVS: Rate is mostly controlled; S1, S2 heard  abdominal: Soft, still has some distention with no tenderness; no organomegaly, bowel sounds heard extremities: Lower extremity edema present bilaterally; no cyanosis  CNS: Alert and awake.  No focal neurologic deficit.  Moving extremities  lymph: No lymph nodes palpable skin: Left upper arm ecchymosis present  psych: No signs of agitation.  Affect is mostly flat  musculoskeletal: No obvious other joint swelling/deformity     Data Reviewed: I have personally reviewed following labs and imaging studies  CBC: Recent Labs  Lab 04/22/22 0530 04/23/22 0831  04/24/22 0503 04/25/22 0547 04/26/22 0545  WBC 4.4 5.2 4.4 3.7* 3.4*  NEUTROABS 3.4 4.1 3.5 2.8 2.5  HGB 9.1* 10.5* 9.3* 9.3* 8.7*  HCT 27.9* 32.0* 28.1* 28.5* 26.7*  MCV 91.2 91.2 91.2 92.2 91.4  PLT 121* 157 123* 128* 431*   Basic Metabolic Panel: Recent Labs  Lab 04/21/22 0534 04/22/22 0530 04/23/22 0831 04/24/22 0503 04/25/22 0547 04/26/22 0545  NA 134* 135 131* 132* 132* 127*  K 4.3 4.5 4.5 4.4 4.6 4.4  CL 106 105 103 102 103 96*  CO2 20* 22 18* 21* 21* 21*  GLUCOSE 276* 162* 236* 218* 188* 279*  BUN 75* 73* 71* 80* 84* 92*  CREATININE 2.49* 2.31* 2.10* 2.24* 2.12* 2.10*  CALCIUM 8.0* 8.2* 8.6* 8.5* 8.5* 7.9*  MG 2.1 2.1  --  2.0 1.7 1.8   GFR: Estimated Creatinine Clearance: 15.9 mL/min (A) (by C-G formula based on SCr of 2.1 mg/dL (H)). Liver Function Tests: Recent Labs  Lab 04/20/22 0537  AST 24  ALT 17  ALKPHOS 48  BILITOT 0.4  PROT 4.5*  ALBUMIN 2.3*   No results for input(s): "LIPASE", "AMYLASE" in the last 168 hours. No results for input(s): "AMMONIA" in the last 168 hours. Coagulation Profile: Recent Labs  Lab 04/22/22 0530 04/23/22 0813 04/24/22 0503 04/25/22 5400 04/26/22 0545  INR 1.0 1.1 1.2 1.4* 1.5*   Cardiac Enzymes: No results for input(s): "CKTOTAL", "CKMB", "CKMBINDEX", "TROPONINI" in the last 168 hours. BNP (last 3 results) No results for input(s): "PROBNP" in the last 8760 hours. HbA1C: No results for input(s): "HGBA1C" in the last 72 hours. CBG: Recent Labs  Lab 04/25/22 1706 04/25/22 2208 04/26/22 0732 04/26/22 1154 04/26/22 1649  GLUCAP 342* 280* 213* 189* 232*   Lipid Profile: No results for input(s): "CHOL", "HDL", "LDLCALC", "TRIG", "CHOLHDL", "LDLDIRECT" in the last 72 hours. Thyroid Function Tests: No results for input(s): "TSH", "T4TOTAL", "FREET4", "T3FREE", "THYROIDAB" in the last 72 hours. Anemia Panel: No results for input(s): "VITAMINB12", "FOLATE", "FERRITIN", "TIBC", "IRON", "RETICCTPCT" in the last  72 hours. Sepsis Labs: No results for input(s): "PROCALCITON", "LATICACIDVEN" in the last 168 hours.  Recent Results (from the past 240 hour(s))  Culture, blood (Routine x 2)     Status: None   Collection Time: 04/18/22 12:16 PM   Specimen: BLOOD  Result Value Ref Range Status   Specimen Description   Final    BLOOD RIGHT ANTECUBITAL Performed at [facility] [address], [place], [place] [address]    Special Requests   Final    BOTTLES DRAWN AEROBIC AND ANAEROBIC Blood Culture results may not be optimal due to an excessive volume of blood received in culture bottles Performed at [facility] [address], [place], [place] [address]    Culture   Final    NO GROWTH 5 DAYS Performed at [facility], [place] [address] [place], [place] [address]    Report Status 04/23/2022 FINAL  Final  Culture, blood (Routine x 2)     Status: None   Collection Time: 04/18/22 12:26 PM   Specimen: BLOOD  Result Value Ref Range Status   Specimen Description   Final    BLOOD LEFT ANTECUBITAL Performed at [facility] [address], [place], [place] [address]    Special Requests   Final    BOTTLES DRAWN AEROBIC AND ANAEROBIC Blood Culture adequate volume Performed at [facility] [address] [place], [place] [address]    Culture   Final    NO GROWTH 5 DAYS Performed at [facility], [place] [address] [place], [place] [address]    Report Status 04/23/2022 FINAL  Final  Urine Culture     Status: None   Collection Time: 04/18/22 12:29 PM   Specimen: In/Out Cath Urine  Result Value Ref Range Status   Specimen Description   Final    IN/OUT CATH URINE Performed at [facility] [address] [place], [place] [address]    Special Requests   Final    NONE Performed at [facility], [place] [address] [place], [place] [address]    Culture   Final    NO  GROWTH Performed at [facility], [place] [address], [place], [place] [address]    Report Status 04/19/2022 FINAL  Final  C Difficile Quick Screen w PCR reflex     Status: Abnormal   Collection Time: 04/19/22  2:00 AM   Specimen: STOOL  Result Value Ref Range Status   C Diff antigen POSITIVE (A) NEGATIVE Final   C Diff toxin NEGATIVE NEGATIVE Final   C Diff interpretation Results are indeterminate. See PCR results.  Final    Comment: Performed at [facility], [place] [address], [place],  [address]  Gastrointestinal Panel by PCR , Stool     Status: None   Collection Time: 04/19/22  2:00 AM   Specimen: Stool  Result Value Ref Range Status   Campylobacter species NOT DETECTED NOT DETECTED Final   Plesimonas shigelloides NOT DETECTED NOT DETECTED Final   Salmonella species NOT DETECTED NOT DETECTED Final   Yersinia enterocolitica NOT DETECTED NOT DETECTED Final   Vibrio species NOT DETECTED NOT DETECTED Final   Vibrio cholerae NOT DETECTED NOT DETECTED Final   Enteroaggregative E coli (EAEC) NOT DETECTED NOT DETECTED Final   Enteropathogenic E coli (EPEC) NOT DETECTED NOT DETECTED Final   Enterotoxigenic E coli (ETEC) NOT DETECTED NOT DETECTED Final   Shiga like toxin producing E coli (STEC) NOT DETECTED NOT DETECTED Final   Shigella/Enteroinvasive E coli (EIEC) NOT DETECTED NOT DETECTED Final   Cryptosporidium NOT DETECTED NOT DETECTED Final   Cyclospora cayetanensis NOT DETECTED NOT DETECTED Final   Entamoeba histolytica NOT DETECTED NOT DETECTED Final   Giardia lamblia NOT DETECTED NOT DETECTED Final   Adenovirus F40/41 NOT DETECTED NOT DETECTED Final   Astrovirus NOT DETECTED NOT DETECTED Final   Norovirus GI/GII NOT DETECTED NOT DETECTED Final   Rotavirus A NOT DETECTED NOT DETECTED Final   Sapovirus (I, II, IV, and V) NOT DETECTED NOT DETECTED Final    Comment: Performed at [facility], [address] [place], [place] [address]  C. Diff by  PCR, Reflexed     Status: Abnormal   Collection Time: 04/19/22  2:00 AM  Result Value Ref Range Status   Toxigenic C. Difficile by PCR POSITIVE (A) NEGATIVE Final    Comment: Positive for toxigenic C. difficile with little to no toxin production. Only treat if clinical presentation suggests symptomatic illness. Performed at [facility], [place] [address], [place], [place] [address]          Radiology Studies: VAS [place] UPPER EXTREMITY VENOUS DUPLEX  Result Date: 04/25/2022 UPPER VENOUS STUDY  Patient Name:  GEISHA ABERNATHY  Date of Exam:   04/25/2022 Medical Rec #: [id number]       Accession #:    [id number] Date of Birth: 1940/12/20       Patient Gender: F Patient Age:   82 years Exam Location:  [facility] Procedure:      VAS [place] UPPER EXTREMITY VENOUS DUPLEX Referring Phys: [name] --------------------------------------------------------------------------------  Indications: Bruising of LUE (forearm) Risk Factors: Hemolytic anemia Per patient - recent lab draw and IV to LUE. Anticoagulation: Coumadin. Comparison Study: No previous exams Performing Technologist: Jody Hill RVT, RDMS  Examination Guidelines: A complete evaluation includes B-mode imaging, spectral Doppler, color Doppler, and power Doppler as needed of all accessible portions of each vessel. Bilateral testing is considered an integral part of a complete examination. Limited examinations for reoccurring indications may be performed as noted.  Right Findings: +----------+------------+---------+-----------+----------+--------------------+ RIGHT     CompressiblePhasicitySpontaneousProperties      Summary        +----------+------------+---------+-----------+----------+--------------------+ Subclavian               Yes       Yes                   patent by  color/doppler      +----------+------------+---------+-----------+----------+--------------------+  Left Findings: +----------+------------+---------+-----------+----------+-------+ LEFT      CompressiblePhasicitySpontaneousPropertiesSummary +----------+------------+---------+-----------+----------+-------+ IJV           Full       Yes       Yes                      +----------+------------+---------+-----------+----------+-------+ Subclavian    Full       Yes       Yes                      +----------+------------+---------+-----------+----------+-------+ Axillary      Full       Yes       Yes                      +----------+------------+---------+-----------+----------+-------+ Brachial      Full       Yes       Yes                      +----------+------------+---------+-----------+----------+-------+ Radial        Full                                          +----------+------------+---------+-----------+----------+-------+ Ulnar         Full                                          +----------+------------+---------+-----------+----------+-------+ Cephalic      Full                                          +----------+------------+---------+-----------+----------+-------+ Basilic       Full       Yes       Yes                      +----------+------------+---------+-----------+----------+-------+  Summary:  Right: No evidence of thrombosis in the subclavian.  Left: No evidence of deep vein thrombosis in the upper extremity. No evidence of superficial vein thrombosis in the upper extremity.  *See table(s) above for measurements and observations.  Diagnosing physician: Deitra Mayo MD Electronically signed by Deitra Mayo MD on 04/25/2022 at 1:05:15 PM.    Final         Scheduled Meds:  allopurinol  100 mg Oral Daily   ALPRAZolam  0.25 mg Oral QHS   amLODipine  10 mg Oral Daily   aspirin EC  81 mg Oral Daily   atorvastatin  80 mg Oral q1800    Chlorhexidine Gluconate Cloth  6 each Topical Daily   cloNIDine  0.2 mg Oral BID   fidaxomicin  200 mg Oral BID   folic acid  1 mg Oral Daily   furosemide  80 mg Oral Daily   And   furosemide  40 mg Oral QPM   hydrALAZINE  100 mg Oral TID   insulin aspart  0-5 Units Subcutaneous QHS   insulin aspart  0-9 Units Subcutaneous TID WC   insulin aspart  3 Units Subcutaneous TID WC   insulin glargine-yfgn  12  Units Subcutaneous Daily   lactose free nutrition  237 mL Oral Q24H   metoprolol succinate  12.5 mg Oral Daily   mirabegron ER  25 mg Oral Daily   pantoprazole  40 mg Oral Daily   predniSONE  20 mg Oral Q breakfast   spironolactone  25 mg Oral Daily   tadalafil  40 mg Oral QPM   Warfarin - Pharmacist Dosing Inpatient   Does not apply q1600   Continuous Infusions:  heparin 900 Units/hr (04/26/22 0104)          Kathie Dike, MD [facility] 04/26/2022, 8:43 PM

## 2022-04-26 NOTE — Progress Notes (Signed)
Physical Therapy Treatment Patient Details Name: [name] MRN: [id number] DOB: 10-20-1940 Today's Date: 04/26/2022   History of Present Illness Mrs. Lanese is an 81 y.o. 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 CT imaging from May suggested the presence of a colovesical fistula, CT completed during hospital stay indicate no signs of diverticulitis or fistula with rectal contrast.    PT Comments    Patient making good progress with mobility and highly motivated to progress strength and endurance to return home. Pt ambulated ~130' with RW and completed sit<>stands at EOS for LE strengthening. Continue to recommend HHPT when pt is medically ready to return home.    Recommendations for follow up therapy are one component of a multi-disciplinary discharge planning process, led by the attending physician.  Recommendations may be updated based on patient status, additional functional criteria and insurance authorization.  Follow Up Recommendations  Home health PT     Assistance Recommended at Discharge Intermittent Supervision/Assistance  Patient can return home with the following Assist for transportation;Assistance with cooking/housework   Equipment Recommendations  Rollator (4 wheels) (delivered)    Recommendations for Other Services       Precautions / Restrictions Precautions Precautions: Fall Restrictions Weight Bearing Restrictions: No     Mobility  Bed Mobility Overal bed mobility: Modified Independent             General bed mobility comments: extra time, HOB up, ues of rail    Transfers Overall transfer level: Needs assistance Equipment used: Rollator (4 wheels) Transfers: Sit to/from Stand Sit to Stand: Supervision           General transfer  comment: assist to set up/lock rollator. sup for safety with rise.    Ambulation/Gait Ambulation/Gait assistance: Min guard Gait Distance (Feet): 130 Feet Assistive device: Rollator (4 wheels) Gait Pattern/deviations: Step-through pattern, Decreased stride length, Shuffle Gait velocity: decr     General Gait Details: pt overall steady with rollator, no LOB. intermittent cues to maintain closer proximity.   Stairs             Wheelchair Mobility    Modified Rankin (Stroke Patients Only)       Balance Overall balance assessment: Needs assistance Sitting-balance support: Feet supported Sitting balance-Leahy Scale: Good     Standing balance support: Reliant on assistive device for balance, During functional activity, Bilateral upper extremity supported Standing balance-Leahy Scale: Fair                              Cognition Arousal/Alertness: Awake/alert Behavior During Therapy: WFL for tasks assessed/performed Overall Cognitive Status: Within Functional Limits for tasks assessed                                          Exercises Other Exercises Other Exercises: 5x sit<>stand with bil UE use for power up    General Comments        Pertinent Vitals/Pain Pain Assessment Pain Assessment: No/denies pain    Home Living                          Prior Function  PT Goals (current goals can now be found in the care plan section) Acute Rehab PT Goals Patient Stated Goal: get home PT Goal Formulation: With patient Time For Goal Achievement: 05/04/22 Potential to Achieve Goals: Good Progress towards PT goals: Progressing toward goals    Frequency    Min 3X/week      PT Plan Current plan remains appropriate    Co-evaluation              AM-PAC PT "6 Clicks" Mobility   Outcome Measure  Help needed turning from your back to your side while in a flat bed without using bedrails?: None Help  needed moving from lying on your back to sitting on the side of a flat bed without using bedrails?: A Little Help needed moving to and from a bed to a chair (including a wheelchair)?: A Little Help needed standing up from a chair using your arms (e.g., wheelchair or bedside chair)?: A Little Help needed to walk in hospital room?: A Little Help needed climbing 3-5 steps with a railing? : A Little 6 Click Score: 19    End of Session Equipment Utilized During Treatment: Gait belt Activity Tolerance: Patient tolerated treatment well Patient left: with call bell/phone within reach;in chair Nurse Communication: Mobility status PT Visit Diagnosis: Unsteadiness on feet (R26.81);Difficulty in walking, not elsewhere classified (R26.2);Muscle weakness (generalized) (M62.81)     Time: 6270-3500 PT Time Calculation (min) (ACUTE ONLY): 18 min  Charges:  $Therapeutic Exercise: 8-22 mins                     Verner Mould, DPT Acute Rehabilitation Services Office [phone] Pager [phone]  04/26/22 12:38 PM

## 2022-04-26 NOTE — Progress Notes (Signed)
ANTICOAGULATION CONSULT NOTE - Follow Up Consult  Pharmacy Consult for warfarin/heparin Indication: PAF, mechanical AVR  Allergies  Allergen Reactions   Flagyl [Metronidazole] Rash and Other (See Comments)    ALL-OVER BODY RASH   Ciprofloxacin Nausea Only and Other (See Comments)    Stomach ache   Coreg [Carvedilol] Other (See Comments)    Terrible cramping in the feet and had a lot of bowel movements, but not diarrhea   Diflucan [Fluconazole] Diarrhea   Losartan Swelling and Other (See Comments)    Patient doesn't recall site of swelling   Privigen [Immune Globulin (Human)] Other (See Comments)    Severe/excruciating pain   Sulfamethoxazole-Trimethoprim Nausea Only and Other (See Comments)    Stomach upset   Verapamil Hives   Zetia [Ezetimibe] Other (See Comments)    Reaction not recalled   Zocor [Simvastatin - High Dose] Other (See Comments)    Reaction not recalled   Gatifloxacin Rash and Other (See Comments)    Redness to skin around eye    Patient Measurements: Height: '4\' 11"'$  (149.9 cm) Weight: 59.4 kg (131 lb) IBW/kg (Calculated) : 43.2 Heparin Dosing Weight: 59.4 kg  Vital Signs: Temp: 97.9 F (36.6 C) (07/19 0500) Temp Source: Oral (07/19 0500) BP: 134/51 (07/19 0500) Pulse Rate: 70 (07/19 0500)  Labs: Recent Labs    04/24/22 0503 04/25/22 0547 04/26/22 0545  HGB 9.3* 9.3* 8.7*  HCT 28.1* 28.5* 26.7*  PLT 123* 128* 107*  LABPROT 14.9 16.7* 17.7*  INR 1.2 1.4* 1.5*  HEPARINUNFRC 0.49 0.44 0.47  CREATININE 2.24* 2.12* 2.10*    Estimated Creatinine Clearance: 15.9 mL/min (A) (by C-G formula based on SCr of 2.1 mg/dL (H)).   Medications:  - Warfarin 5 mg tabs: 7.5 mg after supper on Sunday, Wednesday, and Friday. 5 mg on Monday, Tuesday, Thursday, and Saturday  Assessment: 38 YOWF admitted for persistent diverticulitis. w/ hx of PAF and mechanical AVR on warfarin PTA, CAD (CABG 2005), PAH, CHF, HTN, DM, vasculitis, and autoimmune hemolytic anemia.  Baseline INR supratherapeutic.  Pharmacy consulted to dose warfarin/heparin.   Today, 04/26/22 - INR 1.5, subtherapeutic, up slightly since 04/25/22 - HL 0.47, therapeutic - Hgb low, but stable. Platelets low,but close to baseline - No signs of bleed, but RN reported a 5cm x 5cm hematoma on the L arm (heparin infusing in R arm); RN says she was told it has been present since admit - Patient has diet order, eating 50-100% of meals - Allopurinol may increase warfarin sensitivity; the patient is on PTA regimen - Warfarin dose ordered 7/18, but not charted, patient does not recall receiving dose. Last dose on 7/17, one 5 mg tab  Goal of Therapy:  INR 2-3 Heparin level 0.3-0.7 Monitor platelets by anticoagulation protocol: Yes   Plan:  - Continue heparin infusion at 900 units/hr - Give warfarin 10 mg PO once - Daily HL, INR, CBC - Monitor for signs of bleed/thrombosis - Continue heparin drip until INR >2 for 24 hrs Barnet Pall 04/26/2022,8:07 AM

## 2022-04-27 ENCOUNTER — Ambulatory Visit: Payer: Self-pay

## 2022-04-27 DIAGNOSIS — K529 Noninfective gastroenteritis and colitis, unspecified: Secondary | ICD-10-CM | POA: Diagnosis not present

## 2022-04-27 DIAGNOSIS — I5032 Chronic diastolic (congestive) heart failure: Secondary | ICD-10-CM | POA: Diagnosis not present

## 2022-04-27 DIAGNOSIS — Z7901 Long term (current) use of anticoagulants: Secondary | ICD-10-CM | POA: Diagnosis not present

## 2022-04-27 DIAGNOSIS — D591 Autoimmune hemolytic anemia, unspecified: Secondary | ICD-10-CM | POA: Diagnosis not present

## 2022-04-27 LAB — CBC WITH DIFFERENTIAL/PLATELET
Abs Immature Granulocytes: 0.15 10*3/uL — ABNORMAL HIGH (ref 0.00–0.07)
Basophils Absolute: 0 10*3/uL (ref 0.0–0.1)
Basophils Relative: 0 %
Eosinophils Absolute: 0 10*3/uL (ref 0.0–0.5)
Eosinophils Relative: 1 %
HCT: 26.3 % — ABNORMAL LOW (ref 36.0–46.0)
Hemoglobin: 8.6 g/dL — ABNORMAL LOW (ref 12.0–15.0)
Immature Granulocytes: 4 %
Lymphocytes Relative: 16 %
Lymphs Abs: 0.6 10*3/uL — ABNORMAL LOW (ref 0.7–4.0)
MCH: 30.1 pg (ref 26.0–34.0)
MCHC: 32.7 g/dL (ref 30.0–36.0)
MCV: 92 fL (ref 80.0–100.0)
Monocytes Absolute: 0.2 10*3/uL (ref 0.1–1.0)
Monocytes Relative: 6 %
Neutro Abs: 2.8 10*3/uL (ref 1.7–7.7)
Neutrophils Relative %: 73 %
Platelets: 107 10*3/uL — ABNORMAL LOW (ref 150–400)
RBC: 2.86 MIL/uL — ABNORMAL LOW (ref 3.87–5.11)
RDW: 15.8 % — ABNORMAL HIGH (ref 11.5–15.5)
WBC: 3.8 10*3/uL — ABNORMAL LOW (ref 4.0–10.5)
nRBC: 0 % (ref 0.0–0.2)

## 2022-04-27 LAB — PROTIME-INR
INR: 1.3 — ABNORMAL HIGH (ref 0.8–1.2)
Prothrombin Time: 15.9 seconds — ABNORMAL HIGH (ref 11.4–15.2)

## 2022-04-27 LAB — BASIC METABOLIC PANEL
Anion gap: 9 (ref 5–15)
BUN: 93 mg/dL — ABNORMAL HIGH (ref 8–23)
CO2: 21 mmol/L — ABNORMAL LOW (ref 22–32)
Calcium: 8.3 mg/dL — ABNORMAL LOW (ref 8.9–10.3)
Chloride: 102 mmol/L (ref 98–111)
Creatinine, Ser: 2.18 mg/dL — ABNORMAL HIGH (ref 0.44–1.00)
GFR, Estimated: 22 mL/min — ABNORMAL LOW (ref >60)
Glucose, Bld: 281 mg/dL — ABNORMAL HIGH (ref 70–99)
Potassium: 4.6 mmol/L (ref 3.5–5.1)
Sodium: 132 mmol/L — ABNORMAL LOW (ref 135–145)

## 2022-04-27 LAB — GLUCOSE, CAPILLARY
Glucose-Capillary: 227 mg/dL — ABNORMAL HIGH (ref 70–99)
Glucose-Capillary: 185 mg/dL — ABNORMAL HIGH (ref 70–99)
Glucose-Capillary: 275 mg/dL — ABNORMAL HIGH (ref 70–99)
Glucose-Capillary: 326 mg/dL — ABNORMAL HIGH (ref 70–99)

## 2022-04-27 LAB — HEMOGLOBIN A1C
Hgb A1c MFr Bld: 6.3 % — ABNORMAL HIGH (ref 4.8–5.6)
Mean Plasma Glucose: 134.11 mg/dL

## 2022-04-27 LAB — HEPARIN LEVEL (UNFRACTIONATED): Heparin Unfractionated: 0.39 IU/mL (ref 0.30–0.70)

## 2022-04-27 LAB — MAGNESIUM: Magnesium: 1.8 mg/dL (ref 1.7–2.4)

## 2022-04-27 MED ORDER — WARFARIN SODIUM 5 MG PO TABS
10.0000 mg | ORAL_TABLET | Freq: Once | ORAL | Status: AC
  Administered 2022-04-27: 10 mg via ORAL
  Filled 2022-04-27: qty 2

## 2022-04-27 MED ORDER — ENOXAPARIN SODIUM 60 MG/0.6ML IJ SOSY
60.0000 mg | PREFILLED_SYRINGE | INTRAMUSCULAR | Status: DC
  Administered 2022-04-27 – 2022-04-28 (×2): 60 mg via SUBCUTANEOUS
  Filled 2022-04-27 (×2): qty 0.6

## 2022-04-27 MED ORDER — SIMETHICONE 80 MG PO CHEW
80.0000 mg | CHEWABLE_TABLET | Freq: Once | ORAL | Status: AC
  Administered 2022-04-27: 80 mg via ORAL
  Filled 2022-04-27: qty 1

## 2022-04-27 MED ORDER — ALBUMIN HUMAN 25 % IV SOLN
25.0000 g | Freq: Four times a day (QID) | INTRAVENOUS | Status: AC
  Administered 2022-04-27 – 2022-04-28 (×4): 25 g via INTRAVENOUS
  Filled 2022-04-27 (×4): qty 100

## 2022-04-27 NOTE — Progress Notes (Signed)
PROGRESS NOTE    ROANNE HAYE  MIW:[id number] DOB: [birth year] DOA: 04/18/2022 PCP: [name], MD   Brief Narrative:  81 y.o. female with medical history significant of chronic diastolic HF, autoimmune hemolytic anemia, DM2, HLD, HTN, vasculitis, PAF, recent admission from 04/12/2022-04/16/2022 for emphysematous cystitis (colovesical fistula ruled out) treated with IV antibiotics and subsequent discharge on oral Augmentin presented with worsening vomiting and diarrhea with positive fevers.  On presentation, creatinine was 2.56 with WBC of 11.7.  CT of abdomen and pelvis showed possible sigmoid diverticulitis versus colitis.  She was started on broad-spectrum antibiotics.  She subsequently tested positive for C. difficile and was started on Dificid; antibiotics were discontinued.  Diarrhea is improving.  Currently on heparin bridge along with Coumadin because of subtherapeutic INR.  Assessment & Plan:   Possible C. difficile colitis/diverticulitis -Stool for C. difficile tested positive.  Imaging as above.  Broad-spectrum antibiotics discontinued on 04/19/2022.  Started Dificid on 04/19/2022: Finish 10-day course of therapy.  Diarrhea has improved.  CKD stage IV -Creatinine at baseline.  Monitor  Leukocytosis -Resolved  Thrombocytopenia -monitor.  No signs of bleeding  Chronic diastolic CHF PAF on Coumadin PAH Supratherapeutic INR -Strict input and output.  Daily weights.  Fluid restriction.  Continue current regimen of Lasix along with hydralazine, Toprol-XL, spironolactone.  INR 1.3 today.  Repeat a.m. INR.  Continue Coumadin. Change heparin for lovenox bridge due to limited IV acess.  Outpatient follow-up with cardiology. -Continue tadalafil   Hypoalbuminemia -likely related to acute illness -will add albumin infusions to help with diuresis  History of autoimmune hemolytic anemia History of vasculitis Anemia of chronic disease -Hematology had recommended in the past to  transfuse if hemoglobin is less than 8.  Status post 1 unit packed red cell transfusion on 04/21/2022.  Hemoglobin 9.3 today.  Monitor H&H.  No evidence of hemolysis.  Outpatient follow-up with oncology.  Continue prednisone.  Discussed with Dr. Iruku/hematology via secure chat on 04/21/2022 who recommended to hold mycophenolate while patient is getting treatment for C. difficile and to continue prednisone.  Recent emphysematous cystitis Acute urinary retention -CT on admission did not show evidence of cystitis.  DC'd antibiotics on 04/19/2022.  Currently has a Foley catheter and was supposed to follow-up with urology on 04/20/2022 for possible removal.  Communicated with Dr. Junious Silk on 04/20/2022 via secure chat who recommended to keep the Foley catheter in place and patient has follow-up appointment with Dr. Matilde Sprang on 04/27/2022 at 9:30 AM.  Might have to reengage with urology if patient is still inpatient on 04/27/2022.  Hyponatremia -Mild.  Monitor  Hypertension -continue current regimen.  Physical deconditioning -PT recommends home health PT  Moderate malnutrition -Follow nutrition recommendations  DM2, uncontrolled with hyperglycemia -home dose of glipizide on hold -continue SSI and basal insulin  DVT prophylaxis: Coumadin and heparin Code Status: DNR Family Communication: None at bedside Disposition Plan: Status is: Inpatient Remains inpatient appropriate because: Of severity of illness. INR still subtherapeutic.  Possible discharge in 1 to 3 days once INR gets close to therapeutic range.  Consultants: Contacted urology via secure chat on 04/20/2022.  Communicated with hematology/Dr. Chryl Heck via secure chat on 04/21/2022 Procedures: None  Antimicrobials:  Anti-infectives (From admission, onward)    Start     Dose/Rate Route Frequency Ordered Stop   04/19/22 1000  fidaxomicin (DIFICID) tablet 200 mg        200 mg Oral 2 times daily 04/19/22 0810 04/29/22 0959   04/18/22 2200   piperacillin-tazobactam (  ZOSYN) IVPB 3.375 g  Status:  Discontinued        3.375 g 12.5 mL/hr over 240 Minutes Intravenous Every 12 hours 04/18/22 1528 04/19/22 0823   04/18/22 1530  piperacillin-tazobactam (ZOSYN) IVPB 3.375 g  Status:  Discontinued        3.375 g 100 mL/hr over 30 Minutes Intravenous  Once 04/18/22 1528 04/18/22 1544   04/18/22 1445  piperacillin-tazobactam (ZOSYN) IVPB 3.375 g        3.375 g 100 mL/hr over 30 Minutes Intravenous  Once 04/18/22 1431 04/18/22 1518        Subjective: No BM today. Feels tired. Oral intake has been ok.   Objective: Vitals:   04/26/22 2100 04/27/22 0521 04/27/22 0911 04/27/22 1209  BP: (!) 150/47 (!) 152/49 (!) 146/49 (!) 155/41  Pulse: 72 66 (!) 53 67  Resp: '18 19  20  '$ Temp: 97.7 F (36.5 C) (!) 97.5 F (36.4 C)  97.7 F (36.5 C)  TempSrc: Oral Oral  Oral  SpO2: 100% 98%  99%  Weight:      Height:        Intake/Output Summary (Last 24 hours) at 04/27/2022 1825 Last data filed at 04/27/2022 1739 Gross per 24 hour  Intake 1058 ml  Output 1900 ml  Net -842 ml   Filed Weights   04/18/22 1206  Weight: 59.4 kg    Examination:  General: No acute distress.  Still on room air.  Chronically ill and deconditioned looking ENT/neck: JVD is currently not elevated.  No neck masses or thyromegaly palpated.  Respiratory: Bilateral decreased breath sounds at bases with scattered crackles, no wheezing  CVS: Rate is mostly controlled; S1, S2 heard  abdominal: Soft, still has some distention with no tenderness; no organomegaly, bowel sounds heard extremities: Lower extremity edema present bilaterally; no cyanosis  CNS: Alert and awake.  No focal neurologic deficit.  Moving extremities  lymph: No lymph nodes palpable skin: Left upper arm ecchymosis present  psych: No signs of agitation.  Affect is mostly flat  musculoskeletal: No obvious other joint swelling/deformity     Data Reviewed: I have personally reviewed following labs  and imaging studies  CBC: Recent Labs  Lab 04/23/22 0831 04/24/22 0503 04/25/22 0547 04/26/22 0545 04/27/22 0546  WBC 5.2 4.4 3.7* 3.4* 3.8*  NEUTROABS 4.1 3.5 2.8 2.5 2.8  HGB 10.5* 9.3* 9.3* 8.7* 8.6*  HCT 32.0* 28.1* 28.5* 26.7* 26.3*  MCV 91.2 91.2 92.2 91.4 92.0  PLT 157 123* 128* 107* 132*   Basic Metabolic Panel: Recent Labs  Lab 04/22/22 0530 04/23/22 0831 04/24/22 0503 04/25/22 0547 04/26/22 0545 04/27/22 0546  NA 135 131* 132* 132* 127* 132*  K 4.5 4.5 4.4 4.6 4.4 4.6  CL 105 103 102 103 96* 102  CO2 22 18* 21* 21* 21* 21*  GLUCOSE 162* 236* 218* 188* 279* 281*  BUN 73* 71* 80* 84* 92* 93*  CREATININE 2.31* 2.10* 2.24* 2.12* 2.10* 2.18*  CALCIUM 8.2* 8.6* 8.5* 8.5* 7.9* 8.3*  MG 2.1  --  2.0 1.7 1.8 1.8   GFR: Estimated Creatinine Clearance: 15.3 mL/min (A) (by C-G formula based on SCr of 2.18 mg/dL (H)). Liver Function Tests: No results for input(s): "AST", "ALT", "ALKPHOS", "BILITOT", "PROT", "ALBUMIN" in the last 168 hours.  No results for input(s): "LIPASE", "AMYLASE" in the last 168 hours. No results for input(s): "AMMONIA" in the last 168 hours. Coagulation Profile: Recent Labs  Lab 04/23/22 0813 04/24/22 0503 04/25/22 0547 04/26/22 0545 04/27/22  0546  INR 1.1 1.2 1.4* 1.5* 1.3*   Cardiac Enzymes: No results for input(s): "CKTOTAL", "CKMB", "CKMBINDEX", "TROPONINI" in the last 168 hours. BNP (last 3 results) No results for input(s): "PROBNP" in the last 8760 hours. HbA1C: Recent Labs    04/27/22 0546  HGBA1C 6.3*   CBG: Recent Labs  Lab 04/26/22 1649 04/26/22 2059 04/27/22 0808 04/27/22 1221 04/27/22 1623  GLUCAP 232* 326* 227* 185* 275*   Lipid Profile: No results for input(s): "CHOL", "HDL", "LDLCALC", "TRIG", "CHOLHDL", "LDLDIRECT" in the last 72 hours. Thyroid Function Tests: No results for input(s): "TSH", "T4TOTAL", "FREET4", "T3FREE", "THYROIDAB" in the last 72 hours. Anemia Panel: No results for input(s):  "VITAMINB12", "FOLATE", "FERRITIN", "TIBC", "IRON", "RETICCTPCT" in the last 72 hours. Sepsis Labs: No results for input(s): "PROCALCITON", "LATICACIDVEN" in the last 168 hours.  Recent Results (from the past 240 hour(s))  Culture, blood (Routine x 2)     Status: None   Collection Time: 04/18/22 12:16 PM   Specimen: BLOOD  Result Value Ref Range Status   Specimen Description   Final    BLOOD RIGHT ANTECUBITAL Performed at [facility] [address], [place], [place] [address]    Special Requests   Final    BOTTLES DRAWN AEROBIC AND ANAEROBIC Blood Culture results may not be optimal due to an excessive volume of blood received in culture bottles Performed at [facility] [address], [place], [place] [address]    Culture   Final    NO GROWTH 5 DAYS Performed at [facility], [place] [address] [place], [place] [address]    Report Status 04/23/2022 FINAL  Final  Culture, blood (Routine x 2)     Status: None   Collection Time: 04/18/22 12:26 PM   Specimen: BLOOD  Result Value Ref Range Status   Specimen Description   Final    BLOOD LEFT ANTECUBITAL Performed at [facility] [address] [place], [place] [address]    Special Requests   Final    BOTTLES DRAWN AEROBIC AND ANAEROBIC Blood Culture adequate volume Performed at [facility] [address], [place], [place] [address]    Culture   Final    NO GROWTH 5 DAYS Performed at [facility], [place] [address] [place], [place] [address]    Report Status 04/23/2022 FINAL  Final  Urine Culture     Status: None   Collection Time: 04/18/22 12:29 PM   Specimen: In/Out Cath Urine  Result Value Ref Range Status   Specimen Description   Final    IN/OUT CATH URINE Performed at [facility] [address], [place], [place] [address]    Special Requests   Final    NONE Performed at [facility], [place]  [address] [place], [place] [address]    Culture   Final    NO GROWTH Performed at [facility], [place] [address], [place], [place] [address]    Report Status 04/19/2022 FINAL  Final  C Difficile Quick Screen w PCR reflex     Status: Abnormal   Collection Time: 04/19/22  2:00 AM   Specimen: STOOL  Result Value Ref Range Status   C Diff antigen POSITIVE (A) NEGATIVE Final   C Diff toxin NEGATIVE NEGATIVE Final   C Diff interpretation Results are indeterminate. See PCR results.  Final    Comment: Performed at [facility], [address] [place], [place] [address]  Gastrointestinal Panel by PCR , Stool     Status: None   Collection Time: 04/19/22  2:00 AM   Specimen: Stool  Result Value Ref Range Status   Campylobacter species NOT DETECTED NOT DETECTED Final   Plesimonas shigelloides NOT DETECTED NOT DETECTED Final   Salmonella species NOT DETECTED NOT DETECTED Final   Yersinia enterocolitica NOT DETECTED NOT DETECTED Final   Vibrio species NOT DETECTED NOT DETECTED Final   Vibrio cholerae NOT DETECTED NOT DETECTED Final   Enteroaggregative E coli (EAEC) NOT DETECTED NOT DETECTED Final   Enteropathogenic E coli (EPEC) NOT DETECTED NOT DETECTED Final   Enterotoxigenic E coli (ETEC) NOT DETECTED NOT DETECTED Final   Shiga like toxin producing E coli (STEC) NOT DETECTED NOT DETECTED Final   Shigella/Enteroinvasive E coli (EIEC) NOT DETECTED NOT DETECTED Final   Cryptosporidium NOT DETECTED NOT DETECTED Final   Cyclospora cayetanensis NOT DETECTED NOT DETECTED Final   Entamoeba histolytica NOT DETECTED NOT DETECTED Final   Giardia lamblia NOT DETECTED NOT DETECTED Final   Adenovirus F40/41 NOT DETECTED NOT DETECTED Final   Astrovirus NOT DETECTED NOT DETECTED Final   Norovirus GI/GII NOT DETECTED NOT DETECTED Final   Rotavirus A NOT DETECTED NOT DETECTED Final   Sapovirus (I, II, IV, and V) NOT DETECTED NOT DETECTED Final    Comment: Performed at [facility], [address] [place], [place] [address]  C. Diff by PCR, Reflexed     Status: Abnormal   Collection Time: 04/19/22  2:00 AM  Result Value Ref Range Status   Toxigenic C. Difficile by PCR POSITIVE (A) NEGATIVE Final    Comment: Positive for toxigenic C. difficile with little to no toxin production. Only treat if clinical presentation suggests symptomatic illness. Performed at [facility], [place] [address] [place], [place] [address]          Radiology Studies: No results found.      Scheduled Meds:  allopurinol  100 mg Oral Daily   ALPRAZolam  0.25 mg Oral QHS   amLODipine  10 mg Oral Daily   aspirin EC  81 mg Oral Daily   atorvastatin  80 mg Oral q1800   Chlorhexidine Gluconate Cloth  6 each Topical Daily   cloNIDine  0.2 mg Oral BID   enoxaparin (LOVENOX) injection  60 mg Subcutaneous Q24H   fidaxomicin  200 mg Oral BID   folic acid  1 mg Oral Daily   furosemide  80 mg Oral Daily   And   furosemide  40 mg Oral QPM   hydrALAZINE  100 mg Oral TID   insulin aspart  0-5 Units Subcutaneous QHS   insulin aspart  0-9 Units Subcutaneous TID WC   insulin aspart  3 Units Subcutaneous TID WC   insulin glargine-yfgn  12 Units Subcutaneous Daily   lactose free nutrition  237 mL Oral Q24H   metoprolol succinate  12.5 mg Oral Daily   mirabegron ER  25 mg Oral Daily   pantoprazole  40 mg Oral Daily   predniSONE  20 mg Oral Q breakfast   spironolactone  25 mg Oral Daily   tadalafil  40 mg Oral QPM   Warfarin - Pharmacist Dosing Inpatient   Does not apply q1600   Continuous Infusions:  albumin human            Kathie Dike, MD [facility] 04/27/2022, 6:25 PM

## 2022-04-27 NOTE — Inpatient Diabetes Management (Signed)
Inpatient Diabetes Program Recommendations  AACE/ADA: New Consensus Statement on Inpatient Glycemic Control (2015)  Target Ranges:  Prepandial:   less than 140 mg/dL      Peak postprandial:   less than 180 mg/dL (1-2 hours)      Critically ill patients:  140 - 180 mg/dL   Lab Results  Component Value Date   GLUCAP 227 (H) 04/27/2022   HGBA1C 6.3 (H) 04/27/2022    Review of Glycemic Control  Diabetes history: DM2 Outpatient Diabetes medications: Tresiba 9-14 units QHS, Novolog 2-8 units TID with meals, glipizide 5 mg QD Current orders for Inpatient glycemic control: Semglee 12 QHS, Novolog 0-9 units TID with meals and 0-5 HS + 3 units TID  Inpatient Diabetes Program Recommendations:    Consider increasing Semglee to 14 units QD Increase Novolog to 5 units TID with meals if eating > 50%  Will follow daily glucose trends.   Thank you. Lorenda Peck, RD, LDN, CDE Inpatient Diabetes Coordinator [phone]

## 2022-04-27 NOTE — Progress Notes (Addendum)
Hematoma to left AC measured and marked with sharpie. 15.5cmx22cm. Pt reports it has gotten larger over the past couple of days. Pt thinks it started around the 12th or 13th.

## 2022-04-27 NOTE — Progress Notes (Signed)
ANTICOAGULATION CONSULT NOTE - Follow Up Consult  Pharmacy Consult for heparin/warfarin Indication: Mechanical AVR, PAF  Allergies  Allergen Reactions   Flagyl [Metronidazole] Rash and Other (See Comments)    ALL-OVER BODY RASH   Ciprofloxacin Nausea Only and Other (See Comments)    Stomach ache   Coreg [Carvedilol] Other (See Comments)    Terrible cramping in the feet and had a lot of bowel movements, but not diarrhea   Diflucan [Fluconazole] Diarrhea   Losartan Swelling and Other (See Comments)    Patient doesn't recall site of swelling   Privigen [Immune Globulin (Human)] Other (See Comments)    Severe/excruciating pain   Sulfamethoxazole-Trimethoprim Nausea Only and Other (See Comments)    Stomach upset   Verapamil Hives   Zetia [Ezetimibe] Other (See Comments)    Reaction not recalled   Zocor [Simvastatin - High Dose] Other (See Comments)    Reaction not recalled   Gatifloxacin Rash and Other (See Comments)    Redness to skin around eye    Patient Measurements: Height: '4\' 11"'$  (149.9 cm) Weight: 59.4 kg (131 lb) IBW/kg (Calculated) : 43.2 Heparin Dosing Weight: 59.4 kg  Vital Signs: Temp: 97.7 F (36.5 C) (07/20 1209) Temp Source: Oral (07/20 1209) BP: 155/41 (07/20 1209) Pulse Rate: 67 (07/20 1209)  Labs: Recent Labs    04/25/22 0547 04/26/22 0545 04/27/22 0546  HGB 9.3* 8.7* 8.6*  HCT 28.5* 26.7* 26.3*  PLT 128* 107* 107*  LABPROT 16.7* 17.7* 15.9*  INR 1.4* 1.5* 1.3*  HEPARINUNFRC 0.44 0.47 0.39  CREATININE 2.12* 2.10* 2.18*     Estimated Creatinine Clearance: 15.3 mL/min (A) (by C-G formula based on SCr of 2.18 mg/dL (H)).   Medications:  - Warfarin 5 mg tabs: 7.5 mg after supper on Sunday, Wednesday, and Friday. 5 mg on Monday, Tuesday, Thursday, and Saturday    Assessment: 18 YOWF admitted for persistent diverticulitis. w/ hx of PAF and mechanical AVR on warfarin PTA, CAD (CABG 2005), PAH, CHF, HTN, DM, vasculitis, and autoimmune hemolytic  anemia. Baseline INR supratherapeutic.  Pharmacy consulted to dose warfarin/heparin.  Today, 04/27/22 - INR 1.3, subtherapeutic - Heparin level 0.39, therapeutic - CBC low, but stable. Platelets close to baseline - No signs of bleeding per RN, though hematoma on patient's left arm has slightly increased in size - Patient has diet order, eating 50-100% of meals - Allopurinol may increase warfarin sensitivity; the patient is on PTA regimen  PM update:  - changing heparin to enoxaparin  - SCr 2.18 mg/dl , CrCl 15 ml/min   Goal of Therapy:  INR 2-3 Heparin level 0.3-0.7 units/ml Monitor platelets by anticoagulation protocol: Yes   Plan:  - Stop heparin drip and start enoxaparin 60 mg SQ q24h  - Warfarin 10  mg tab PO once - Daily HL, INR, CBC - Monitor for signs of bleed/thrombosis - Continue heparin drip until INR >2 for 24 hrs   Royetta Asal, PharmD, BCPS 04/27/2022 4:36 PM

## 2022-04-27 NOTE — Care Management Important Message (Signed)
Important Message  Patient Details IM Letter given to the Patient. Name: [name] MRN: [id number] Date of Birth: 05/02/1941   Medicare Important Message Given:  Yes     Kerin Salen 04/27/2022, 10:32 AM

## 2022-04-27 NOTE — Progress Notes (Signed)
ANTICOAGULATION CONSULT NOTE - Follow Up Consult  Pharmacy Consult for heparin/warfarin Indication: Mechanical AVR, PAF  Allergies  Allergen Reactions   Flagyl [Metronidazole] Rash and Other (See Comments)    ALL-OVER BODY RASH   Ciprofloxacin Nausea Only and Other (See Comments)    Stomach ache   Coreg [Carvedilol] Other (See Comments)    Terrible cramping in the feet and had a lot of bowel movements, but not diarrhea   Diflucan [Fluconazole] Diarrhea   Losartan Swelling and Other (See Comments)    Patient doesn't recall site of swelling   Privigen [Immune Globulin (Human)] Other (See Comments)    Severe/excruciating pain   Sulfamethoxazole-Trimethoprim Nausea Only and Other (See Comments)    Stomach upset   Verapamil Hives   Zetia [Ezetimibe] Other (See Comments)    Reaction not recalled   Zocor [Simvastatin - High Dose] Other (See Comments)    Reaction not recalled   Gatifloxacin Rash and Other (See Comments)    Redness to skin around eye    Patient Measurements: Height: '4\' 11"'$  (149.9 cm) Weight: 59.4 kg (131 lb) IBW/kg (Calculated) : 43.2 Heparin Dosing Weight: 59.4 kg  Vital Signs: Temp: 97.5 F (36.4 C) (07/20 0521) Temp Source: Oral (07/20 0521) BP: 146/49 (07/20 0911) Pulse Rate: 53 (07/20 0911)  Labs: Recent Labs    04/25/22 0547 04/26/22 0545 04/27/22 0546  HGB 9.3* 8.7* 8.6*  HCT 28.5* 26.7* 26.3*  PLT 128* 107* 107*  LABPROT 16.7* 17.7* 15.9*  INR 1.4* 1.5* 1.3*  HEPARINUNFRC 0.44 0.47 0.39  CREATININE 2.12* 2.10* 2.18*    Estimated Creatinine Clearance: 15.3 mL/min (A) (by C-G formula based on SCr of 2.18 mg/dL (H)).   Medications:  - Warfarin 5 mg tabs: 7.5 mg after supper on Sunday, Wednesday, and Friday. 5 mg on Monday, Tuesday, Thursday, and Saturday    Assessment: 41 YOWF admitted for persistent diverticulitis. w/ hx of PAF and mechanical AVR on warfarin PTA, CAD (CABG 2005), PAH, CHF, HTN, DM, vasculitis, and autoimmune hemolytic  anemia. Baseline INR supratherapeutic.  Pharmacy consulted to dose warfarin/heparin.  Today, 04/27/22 - INR 1.3, subtherapeutic - Heparin level 0.39, therapeutic - CBC low, but stable. Platelets close to baseline - No signs of bleeding per RN, though hematoma on patient's left arm has slightly increased in size - Patient has diet order, eating 50-100% of meals - Allopurinol may increase warfarin sensitivity; the patient is on PTA regimen  Goal of Therapy:  INR 2-3 Heparin level 0.3-0.7 units/ml Monitor platelets by anticoagulation protocol: Yes   Plan:  - Continue heparin 900 units/hr infusion - Warfarin 10  mg tab PO once - Daily HL, INR, CBC - Monitor for signs of bleed/thrombosis - Continue heparin drip until INR >2 for 24 hrs  Barnet Pall 04/27/2022,9:18 AM

## 2022-04-28 ENCOUNTER — Ambulatory Visit: Payer: Self-pay

## 2022-04-28 DIAGNOSIS — K529 Noninfective gastroenteritis and colitis, unspecified: Secondary | ICD-10-CM | POA: Diagnosis not present

## 2022-04-28 DIAGNOSIS — Z7901 Long term (current) use of anticoagulants: Secondary | ICD-10-CM | POA: Diagnosis not present

## 2022-04-28 DIAGNOSIS — I5032 Chronic diastolic (congestive) heart failure: Secondary | ICD-10-CM | POA: Diagnosis not present

## 2022-04-28 DIAGNOSIS — D591 Autoimmune hemolytic anemia, unspecified: Secondary | ICD-10-CM | POA: Diagnosis not present

## 2022-04-28 LAB — CBC WITH DIFFERENTIAL/PLATELET
Abs Immature Granulocytes: 0.16 10*3/uL — ABNORMAL HIGH (ref 0.00–0.07)
Basophils Absolute: 0 10*3/uL (ref 0.0–0.1)
Basophils Relative: 0 %
Eosinophils Absolute: 0 10*3/uL (ref 0.0–0.5)
Eosinophils Relative: 0 %
HCT: 24.8 % — ABNORMAL LOW (ref 36.0–46.0)
Hemoglobin: 8.1 g/dL — ABNORMAL LOW (ref 12.0–15.0)
Immature Granulocytes: 4 %
Lymphocytes Relative: 13 %
Lymphs Abs: 0.6 10*3/uL — ABNORMAL LOW (ref 0.7–4.0)
MCH: 30.1 pg (ref 26.0–34.0)
MCHC: 32.7 g/dL (ref 30.0–36.0)
MCV: 92.2 fL (ref 80.0–100.0)
Monocytes Absolute: 0.3 10*3/uL (ref 0.1–1.0)
Monocytes Relative: 6 %
Neutro Abs: 3.5 10*3/uL (ref 1.7–7.7)
Neutrophils Relative %: 77 %
Platelets: 105 10*3/uL — ABNORMAL LOW (ref 150–400)
RBC: 2.69 MIL/uL — ABNORMAL LOW (ref 3.87–5.11)
RDW: 16.1 % — ABNORMAL HIGH (ref 11.5–15.5)
WBC: 4.5 10*3/uL (ref 4.0–10.5)
nRBC: 0 % (ref 0.0–0.2)

## 2022-04-28 LAB — GLUCOSE, CAPILLARY
Glucose-Capillary: 178 mg/dL — ABNORMAL HIGH (ref 70–99)
Glucose-Capillary: 208 mg/dL — ABNORMAL HIGH (ref 70–99)
Glucose-Capillary: 266 mg/dL — ABNORMAL HIGH (ref 70–99)
Glucose-Capillary: 368 mg/dL — ABNORMAL HIGH (ref 70–99)

## 2022-04-28 LAB — COMPREHENSIVE METABOLIC PANEL
ALT: 40 U/L (ref 0–44)
AST: 44 U/L — ABNORMAL HIGH (ref 15–41)
Albumin: 3.1 g/dL — ABNORMAL LOW (ref 3.5–5.0)
Alkaline Phosphatase: 63 U/L (ref 38–126)
Anion gap: 9 (ref 5–15)
BUN: 96 mg/dL — ABNORMAL HIGH (ref 8–23)
CO2: 22 mmol/L (ref 22–32)
Calcium: 8.5 mg/dL — ABNORMAL LOW (ref 8.9–10.3)
Chloride: 101 mmol/L (ref 98–111)
Creatinine, Ser: 2.07 mg/dL — ABNORMAL HIGH (ref 0.44–1.00)
GFR, Estimated: 23 mL/min — ABNORMAL LOW (ref >60)
Glucose, Bld: 256 mg/dL — ABNORMAL HIGH (ref 70–99)
Potassium: 4.5 mmol/L (ref 3.5–5.1)
Sodium: 132 mmol/L — ABNORMAL LOW (ref 135–145)
Total Bilirubin: 0.6 mg/dL (ref 0.3–1.2)
Total Protein: 5.2 g/dL — ABNORMAL LOW (ref 6.5–8.1)

## 2022-04-28 LAB — PROTIME-INR
INR: 1.6 — ABNORMAL HIGH (ref 0.8–1.2)
Prothrombin Time: 19.3 seconds — ABNORMAL HIGH (ref 11.4–15.2)

## 2022-04-28 LAB — ZINC: Zinc: 46 ug/dL (ref 44–115)

## 2022-04-28 MED ORDER — WARFARIN SODIUM 5 MG PO TABS
7.5000 mg | ORAL_TABLET | Freq: Once | ORAL | Status: AC
Start: 2022-04-28 — End: 2022-04-28
  Administered 2022-04-28: 7.5 mg via ORAL
  Filled 2022-04-28: qty 1

## 2022-04-28 MED ORDER — BISACODYL 5 MG PO TBEC
10.0000 mg | DELAYED_RELEASE_TABLET | Freq: Once | ORAL | Status: AC
  Administered 2022-04-28: 10 mg via ORAL
  Filled 2022-04-28: qty 2

## 2022-04-28 MED ORDER — BISACODYL 10 MG RE SUPP
10.0000 mg | Freq: Once | RECTAL | Status: AC
  Administered 2022-04-28: 10 mg via RECTAL
  Filled 2022-04-28: qty 1

## 2022-04-28 NOTE — Progress Notes (Signed)
Physical Therapy Treatment Patient Details Name: [name] MRN: [id number] DOB: 11-06-1940 Today's Date: 04/28/2022   History of Present Illness [name] is an 81 y.o. 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 CT imaging from May suggested the presence of a colovesical fistula, CT completed during hospital stay indicate no signs of diverticulitis or fistula with rectal contrast.    PT Comments    Pt is progressing well with mobility, she ambulated 160' with rollator, she was steady, no loss of balance. Edema noted R foot. Encouraged ankle pumps, feet elevated at end of session.    Recommendations for follow up therapy are one component of a multi-disciplinary discharge planning process, led by the attending physician.  Recommendations may be updated based on patient status, additional functional criteria and insurance authorization.  Follow Up Recommendations  Home health PT     Assistance Recommended at Discharge Intermittent Supervision/Assistance  Patient can return home with the following Assist for transportation;Assistance with cooking/housework   Equipment Recommendations  Rollator (4 wheels) (delivered)    Recommendations for Other Services       Precautions / Restrictions Precautions Precautions: Fall Restrictions Weight Bearing Restrictions: No     Mobility  Bed Mobility Overal bed mobility: Modified Independent       Supine to sit: Modified independent (Device/Increase time)     General bed mobility comments: HOB up, use of rail    Transfers Overall transfer level: Needs assistance Equipment used: Rollator (4 wheels) Transfers: Sit to/from Stand Sit to Stand: Supervision           General transfer comment: assist to set up/lock rollator.  sup for safety with rise.    Ambulation/Gait Ambulation/Gait assistance: Supervision Gait Distance (Feet): 160 Feet Assistive device: Rollator (4 wheels) Gait Pattern/deviations: Step-through pattern, Decreased stride length Gait velocity: decr     General Gait Details: pt overall steady with rollator, no LOB.   Stairs             Wheelchair Mobility    Modified Rankin (Stroke Patients Only)       Balance Overall balance assessment: Needs assistance Sitting-balance support: Feet supported Sitting balance-Leahy Scale: Good     Standing balance support: Reliant on assistive device for balance, During functional activity, Bilateral upper extremity supported Standing balance-Leahy Scale: Fair                              Cognition Arousal/Alertness: Awake/alert Behavior During Therapy: WFL for tasks assessed/performed Overall Cognitive Status: Within Functional Limits for tasks assessed                                          Exercises      General Comments        Pertinent Vitals/Pain Pain Assessment Faces Pain Scale: Hurts little more Pain Location: abdomen -pt reports she's constipated Pain Descriptors / Indicators: Aching Pain Intervention(s): Limited activity within patient's tolerance, Monitored during session    Home Living                          Prior Function  PT Goals (current goals can now be found in the care plan section) Acute Rehab PT Goals Patient Stated Goal: get home PT Goal Formulation: With patient Time For Goal Achievement: 05/04/22 Potential to Achieve Goals: Good Progress towards PT goals: Progressing toward goals    Frequency    Min 3X/week      PT Plan Current plan remains appropriate    Co-evaluation              AM-PAC PT "6 Clicks" Mobility   Outcome Measure  Help needed turning from your back to your side while in a flat bed without using  bedrails?: None Help needed moving from lying on your back to sitting on the side of a flat bed without using bedrails?: A Little Help needed moving to and from a bed to a chair (including a wheelchair)?: A Little Help needed standing up from a chair using your arms (e.g., wheelchair or bedside chair)?: A Little Help needed to walk in hospital room?: None Help needed climbing 3-5 steps with a railing? : A Little 6 Click Score: 20    End of Session Equipment Utilized During Treatment: Gait belt Activity Tolerance: Patient tolerated treatment well Patient left: with call bell/phone within reach;in chair Nurse Communication: Mobility status PT Visit Diagnosis: Unsteadiness on feet (R26.81);Difficulty in walking, not elsewhere classified (R26.2);Muscle weakness (generalized) (M62.81)     Time: 0902-0920 PT Time Calculation (min) (ACUTE ONLY): 18 min  Charges:  $Gait Training: 8-22 mins                     [name] PT 04/28/2022  Acute Rehabilitation Services  Office [phone]

## 2022-04-28 NOTE — Progress Notes (Signed)
ANTICOAGULATION CONSULT NOTE - Initial Consult  Pharmacy Consult for warfarin/enoxaparin Indication: Mechanical AVR, PAF  Allergies  Allergen Reactions   Flagyl [Metronidazole] Rash and Other (See Comments)    ALL-OVER BODY RASH   Ciprofloxacin Nausea Only and Other (See Comments)    Stomach ache   Coreg [Carvedilol] Other (See Comments)    Terrible cramping in the feet and had a lot of bowel movements, but not diarrhea   Diflucan [Fluconazole] Diarrhea   Losartan Swelling and Other (See Comments)    Patient doesn't recall site of swelling   Privigen [Immune Globulin (Human)] Other (See Comments)    Severe/excruciating pain   Sulfamethoxazole-Trimethoprim Nausea Only and Other (See Comments)    Stomach upset   Verapamil Hives   Zetia [Ezetimibe] Other (See Comments)    Reaction not recalled   Zocor [Simvastatin - High Dose] Other (See Comments)    Reaction not recalled   Gatifloxacin Rash and Other (See Comments)    Redness to skin around eye    Patient Measurements: Height: '4\' 11"'$  (149.9 cm) Weight: 59.4 kg (131 lb) IBW/kg (Calculated) : 43.2 Heparin Dosing Weight: 59.4 kg  Vital Signs: Temp: 97.4 F (36.3 C) (07/21 0545) Temp Source: Oral (07/21 0545) BP: 150/54 (07/21 0545) Pulse Rate: 73 (07/21 0545)  Labs: Recent Labs    04/26/22 0545 04/27/22 0546 04/28/22 0431  HGB 8.7* 8.6* 8.1*  HCT 26.7* 26.3* 24.8*  PLT 107* 107* 105*  LABPROT 17.7* 15.9* 19.3*  INR 1.5* 1.3* 1.6*  HEPARINUNFRC 0.47 0.39  --   CREATININE 2.10* 2.18* 2.07*    Estimated Creatinine Clearance: 16.2 mL/min (A) (by C-G formula based on SCr of 2.07 mg/dL (H)).   Medical History: Past Medical History:  Diagnosis Date   Aortic atherosclerosis ([place]) 01/23/2018   Atrial fibrillation ([facility])    Benign positional vertigo 10/06/2011   Carotid artery disease [facility])    Chemotherapy induced neutropenia ([place]) 07/21/2019   Cholelithiasis 01/23/2018   Cholelithiasis 01/23/2018   Chronic  anticoagulation    Coronary artery disease    status post coronary artery bypass grafting times 07/10/2004   Diabetes mellitus    Hypercholesteremia    Hypertension    Mechanical heart valve present    H. aortic valve replacement at the time of bypass surgery October 2005   Moderate to severe pulmonary hypertension ([place])    Peripheral arterial disease ([place])    history of left common iliac artery PTA and stenting for a chronic total occlusion 08/26/01    Medications:  - Warfarin 5 mg tabs: 7.5 mg after supper on Sunday, Wednesday, and Friday. 5 mg on Monday, Tuesday, Thursday, and Saturday  Assessment: 47 YOWF admitted for persistent diverticulitis. w/ hx of PAF and mechanical AVR on warfarin PTA, CAD (CABG 2005), PAH, CHF, HTN, DM, vasculitis, and autoimmune hemolytic anemia. Baseline INR supratherapeutic. Intially INR bridged w/ heparin, switched to enoxaparin d/t limited IV access.  Pharmacy consulted to dose warfarin/enoxaparin.    Today, 04/27/22 - INR 1.6, subtherapeutic - CBC low, but stable. Platelets close to baseline - No signs of bleeding per RN, no hematoma change since 7/20 - Patient has diet order, eating 25-75% of meals - Allopurinol may increase warfarin sensitivity; the patient is on PTA regimen  Goal of Therapy:  - INR 2-3 - LMWH level 1-2 units/mL (at steady state as indicated)  Plan:  - Give warfarin 7.5 mg tab PO once - Continue enoxaparin 60 mg SQ daily - Daily INR, CBC, SCr -  Monitor for signs of bleed/thrombosis - Continue enoxaparin until INR >2 for 24 hrs  Barnet Pall 04/28/2022,8:36 AM

## 2022-04-28 NOTE — Progress Notes (Signed)
PROGRESS NOTE    [name]  NWG:[id number] DOB: 10-13-1940 DOA: 04/18/2022 PCP: [name], MD   Brief Narrative:  81 y.o. female with medical history significant of chronic diastolic HF, autoimmune hemolytic anemia, DM2, HLD, HTN, vasculitis, PAF, recent admission from 04/12/2022-04/16/2022 for emphysematous cystitis (colovesical fistula ruled out) treated with IV antibiotics and subsequent discharge on oral Augmentin presented with worsening vomiting and diarrhea with positive fevers.  On presentation, creatinine was 2.56 with WBC of 11.7.  CT of abdomen and pelvis showed possible sigmoid diverticulitis versus colitis.  She was started on broad-spectrum antibiotics.  She subsequently tested positive for C. difficile and was started on Dificid; antibiotics were discontinued.  Diarrhea is improving.  Currently on heparin bridge along with Coumadin because of subtherapeutic INR.  Assessment & Plan:   Possible C. difficile colitis/diverticulitis -Stool for C. difficile tested positive.  Imaging as above.  Broad-spectrum antibiotics discontinued on 04/19/2022.  Started Dificid on 04/19/2022: Finish 10-day course of therapy.  Diarrhea has improved.  Abdominal pain -may be related to constipation since she has not had a BM in 2 days -give dulcolax suppository  CKD stage IV -Creatinine at baseline.  Monitor  Leukocytosis -Resolved  Thrombocytopenia -monitor.  No signs of bleeding  Chronic diastolic CHF PAF on Coumadin PAH Supratherapeutic INR -Strict input and output.  Daily weights.  Fluid restriction.  Continue current regimen of Lasix along with hydralazine, Toprol-XL, spironolactone.  INR 1.6 today.  Repeat a.m. INR.  Continue Coumadin. Change heparin for lovenox bridge due to limited IV acess.  Outpatient follow-up with cardiology. -Continue tadalafil -continues to have good urine output   Hypoalbuminemia -likely related to acute illness -she received albumin on  7/20  History of autoimmune hemolytic anemia History of vasculitis Anemia of chronic disease -Hematology had recommended in the past to transfuse if hemoglobin is less than 8.  Status post 1 unit packed red cell transfusion on 04/21/2022.  Hemoglobin 8.1 today.  Monitor H&H.  No evidence of hemolysis.  Outpatient follow-up with oncology.  Continue prednisone.  Discussed with Dr. Iruku/hematology via secure chat on 04/21/2022 who recommended to hold mycophenolate while patient is getting treatment for C. difficile and to continue prednisone.  Recent emphysematous cystitis Acute urinary retention -CT on admission did not show evidence of cystitis.  DC'd antibiotics on 04/19/2022.  Currently has a Foley catheter  -discussed with her urologist Dr. Matilde Sprang who recommended continuing foley catheter until she can follow as outpatient next week. -will flush foley to ensure it is draining appropriately  Hyponatremia -Mild.  Monitor  Hypertension -continue current regimen.  Physical deconditioning -PT recommends home health PT  Moderate malnutrition -Follow nutrition recommendations  DM2, uncontrolled with hyperglycemia -home dose of glipizide on hold -continue SSI and basal insulin  DVT prophylaxis: Coumadin and heparin Code Status: DNR Family Communication: None at bedside Disposition Plan: Status is: Inpatient Remains inpatient appropriate because: Of severity of illness. INR still subtherapeutic.  Possible discharge in 1 to 3 days once INR gets close to therapeutic range.  Consultants: Contacted urology via secure chat on 04/20/2022.  Communicated with hematology/Dr. Chryl Heck via secure chat on 04/21/2022 Procedures: None  Antimicrobials:  Anti-infectives (From admission, onward)    Start     Dose/Rate Route Frequency Ordered Stop   04/19/22 1000  fidaxomicin (DIFICID) tablet 200 mg        200 mg Oral 2 times daily 04/19/22 0810 04/29/22 0959   04/18/22 2200  piperacillin-tazobactam  (ZOSYN) IVPB 3.375 g  Status:  Discontinued        3.375 g 12.5 mL/hr over 240 Minutes Intravenous Every 12 hours 04/18/22 1528 04/19/22 0823   04/18/22 1530  piperacillin-tazobactam (ZOSYN) IVPB 3.375 g  Status:  Discontinued        3.375 g 100 mL/hr over 30 Minutes Intravenous  Once 04/18/22 1528 04/18/22 1544   04/18/22 1445  piperacillin-tazobactam (ZOSYN) IVPB 3.375 g        3.375 g 100 mL/hr over 30 Minutes Intravenous  Once 04/18/22 1431 04/18/22 1518        Subjective: She has not had a BM in over 2 days. Reports pain in rectum. She has pain in her right foot with increase swelling on dorsum of foot. She feels generally unwell  Objective: Vitals:   04/27/22 2002 04/27/22 2133 04/28/22 0545 04/28/22 1200  BP: (!) 128/58 (!) 158/56 (!) 150/54 (!) 161/59  Pulse: 66 81 73 60  Resp: '18 18 16 17  '$ Temp: 97.9 F (36.6 C) 97.8 F (36.6 C) (!) 97.4 F (36.3 C) 97.7 F (36.5 C)  TempSrc: Oral Oral Oral Oral  SpO2: 98% 97% 99% 99%  Weight:      Height:        Intake/Output Summary (Last 24 hours) at 04/28/2022 1342 Last data filed at 04/28/2022 1251 Gross per 24 hour  Intake 932.35 ml  Output 3350 ml  Net -2417.65 ml   Filed Weights   04/18/22 1206  Weight: 59.4 kg    Examination:  General: No acute distress.  Still on room air.  Chronically ill and deconditioned looking ENT/neck: JVD is currently not elevated.  No neck masses or thyromegaly palpated.  Respiratory: Bilateral decreased breath sounds at bases with scattered crackles, no wheezing  CVS: Rate is mostly controlled; S1, S2 heard  abdominal: Soft, distended, she does have bowel sounds, non tender extremities: bilateral lower extremity edema CNS: Alert and awake.  No focal neurologic deficit.  Moving extremities  lymph: No lymph nodes palpable skin: Left upper arm ecchymosis is stable  psych: No signs of agitation.  Affect is mostly flat  musculoskeletal: right foot dorsal swelling     Data Reviewed: I  have personally reviewed following labs and imaging studies  CBC: Recent Labs  Lab 04/24/22 0503 04/25/22 0547 04/26/22 0545 04/27/22 0546 04/28/22 0431  WBC 4.4 3.7* 3.4* 3.8* 4.5  NEUTROABS 3.5 2.8 2.5 2.8 3.5  HGB 9.3* 9.3* 8.7* 8.6* 8.1*  HCT 28.1* 28.5* 26.7* 26.3* 24.8*  MCV 91.2 92.2 91.4 92.0 92.2  PLT 123* 128* 107* 107* 235*   Basic Metabolic Panel: Recent Labs  Lab 04/22/22 0530 04/23/22 0831 04/24/22 0503 04/25/22 0547 04/26/22 0545 04/27/22 0546 04/28/22 0431  NA 135   < > 132* 132* 127* 132* 132*  K 4.5   < > 4.4 4.6 4.4 4.6 4.5  CL 105   < > 102 103 96* 102 101  CO2 22   < > 21* 21* 21* 21* 22  GLUCOSE 162*   < > 218* 188* 279* 281* 256*  BUN 73*   < > 80* 84* 92* 93* 96*  CREATININE 2.31*   < > 2.24* 2.12* 2.10* 2.18* 2.07*  CALCIUM 8.2*   < > 8.5* 8.5* 7.9* 8.3* 8.5*  MG 2.1  --  2.0 1.7 1.8 1.8  --    < > = values in this interval not displayed.   GFR: Estimated Creatinine Clearance: 16.2 mL/min (A) (by C-G formula based on SCr of  2.07 mg/dL (H)). Liver Function Tests: Recent Labs  Lab 04/28/22 0431  AST 44*  ALT 40  ALKPHOS 63  BILITOT 0.6  PROT 5.2*  ALBUMIN 3.1*    No results for input(s): "LIPASE", "AMYLASE" in the last 168 hours. No results for input(s): "AMMONIA" in the last 168 hours. Coagulation Profile: Recent Labs  Lab 04/24/22 0503 04/25/22 0547 04/26/22 0545 04/27/22 0546 04/28/22 0431  INR 1.2 1.4* 1.5* 1.3* 1.6*   Cardiac Enzymes: No results for input(s): "CKTOTAL", "CKMB", "CKMBINDEX", "TROPONINI" in the last 168 hours. BNP (last 3 results) No results for input(s): "PROBNP" in the last 8760 hours. HbA1C: Recent Labs    04/27/22 0546  HGBA1C 6.3*   CBG: Recent Labs  Lab 04/27/22 1221 04/27/22 1623 04/27/22 2157 04/28/22 0723 04/28/22 1201  GLUCAP 185* 275* 326* 178* 208*   Lipid Profile: No results for input(s): "CHOL", "HDL", "LDLCALC", "TRIG", "CHOLHDL", "LDLDIRECT" in the last 72 hours. Thyroid  Function Tests: No results for input(s): "TSH", "T4TOTAL", "FREET4", "T3FREE", "THYROIDAB" in the last 72 hours. Anemia Panel: No results for input(s): "VITAMINB12", "FOLATE", "FERRITIN", "TIBC", "IRON", "RETICCTPCT" in the last 72 hours. Sepsis Labs: No results for input(s): "PROCALCITON", "LATICACIDVEN" in the last 168 hours.  Recent Results (from the past 240 hour(s))  C Difficile Quick Screen w PCR reflex     Status: Abnormal   Collection Time: 04/19/22  2:00 AM   Specimen: STOOL  Result Value Ref Range Status   C Diff antigen POSITIVE (A) NEGATIVE Final   C Diff toxin NEGATIVE NEGATIVE Final   C Diff interpretation Results are indeterminate. See PCR results.  Final    Comment: Performed at [facility], [place] [address], [place], [place] [address]  Gastrointestinal Panel by PCR , Stool     Status: None   Collection Time: 04/19/22  2:00 AM   Specimen: Stool  Result Value Ref Range Status   Campylobacter species NOT DETECTED NOT DETECTED Final   Plesimonas shigelloides NOT DETECTED NOT DETECTED Final   Salmonella species NOT DETECTED NOT DETECTED Final   Yersinia enterocolitica NOT DETECTED NOT DETECTED Final   Vibrio species NOT DETECTED NOT DETECTED Final   Vibrio cholerae NOT DETECTED NOT DETECTED Final   Enteroaggregative E coli (EAEC) NOT DETECTED NOT DETECTED Final   Enteropathogenic E coli (EPEC) NOT DETECTED NOT DETECTED Final   Enterotoxigenic E coli (ETEC) NOT DETECTED NOT DETECTED Final   Shiga like toxin producing E coli (STEC) NOT DETECTED NOT DETECTED Final   Shigella/Enteroinvasive E coli (EIEC) NOT DETECTED NOT DETECTED Final   Cryptosporidium NOT DETECTED NOT DETECTED Final   Cyclospora cayetanensis NOT DETECTED NOT DETECTED Final   Entamoeba histolytica NOT DETECTED NOT DETECTED Final   Giardia lamblia NOT DETECTED NOT DETECTED Final   Adenovirus F40/41 NOT DETECTED NOT DETECTED Final   Astrovirus NOT DETECTED NOT DETECTED Final   Norovirus  GI/GII NOT DETECTED NOT DETECTED Final   Rotavirus A NOT DETECTED NOT DETECTED Final   Sapovirus (I, II, IV, and V) NOT DETECTED NOT DETECTED Final    Comment: Performed at [facility], [place] [place], [place] [address]  C. Diff by PCR, Reflexed     Status: Abnormal   Collection Time: 04/19/22  2:00 AM  Result Value Ref Range Status   Toxigenic C. Difficile by PCR POSITIVE (A) NEGATIVE Final    Comment: Positive for toxigenic C. difficile with little to no toxin production. Only treat if clinical presentation suggests symptomatic illness.  Performed at [facility], [place] [address] [place], [place] [address]          Radiology Studies: No results found.      Scheduled Meds:  allopurinol  100 mg Oral Daily   ALPRAZolam  0.25 mg Oral QHS   amLODipine  10 mg Oral Daily   aspirin EC  81 mg Oral Daily   atorvastatin  80 mg Oral q1800   Chlorhexidine Gluconate Cloth  6 each Topical Daily   cloNIDine  0.2 mg Oral BID   enoxaparin (LOVENOX) injection  60 mg Subcutaneous Q24H   fidaxomicin  200 mg Oral BID   folic acid  1 mg Oral Daily   furosemide  80 mg Oral Daily   And   furosemide  40 mg Oral QPM   hydrALAZINE  100 mg Oral TID   insulin aspart  0-5 Units Subcutaneous QHS   insulin aspart  0-9 Units Subcutaneous TID WC   insulin aspart  3 Units Subcutaneous TID WC   insulin glargine-yfgn  12 Units Subcutaneous Daily   lactose free nutrition  237 mL Oral Q24H   metoprolol succinate  12.5 mg Oral Daily   mirabegron ER  25 mg Oral Daily   pantoprazole  40 mg Oral Daily   predniSONE  20 mg Oral Q breakfast   spironolactone  25 mg Oral Daily   tadalafil  40 mg Oral QPM   warfarin  7.5 mg Oral ONCE-1600   Warfarin - Pharmacist Dosing Inpatient   Does not apply q1600   Continuous Infusions:          Kathie Dike, MD [facility] 04/28/2022, 1:42 PM

## 2022-04-29 ENCOUNTER — Inpatient Hospital Stay (HOSPITAL_COMMUNITY): Payer: Medicare HMO

## 2022-04-29 DIAGNOSIS — D591 Autoimmune hemolytic anemia, unspecified: Secondary | ICD-10-CM | POA: Diagnosis not present

## 2022-04-29 DIAGNOSIS — Z7901 Long term (current) use of anticoagulants: Secondary | ICD-10-CM | POA: Diagnosis not present

## 2022-04-29 DIAGNOSIS — I5032 Chronic diastolic (congestive) heart failure: Secondary | ICD-10-CM | POA: Diagnosis not present

## 2022-04-29 DIAGNOSIS — K529 Noninfective gastroenteritis and colitis, unspecified: Secondary | ICD-10-CM | POA: Diagnosis not present

## 2022-04-29 LAB — CBC WITH DIFFERENTIAL/PLATELET
Abs Immature Granulocytes: 0.12 10*3/uL — ABNORMAL HIGH (ref 0.00–0.07)
Basophils Absolute: 0 10*3/uL (ref 0.0–0.1)
Basophils Relative: 0 %
Eosinophils Absolute: 0 10*3/uL (ref 0.0–0.5)
Eosinophils Relative: 0 %
HCT: 25.4 % — ABNORMAL LOW (ref 36.0–46.0)
Hemoglobin: 8.4 g/dL — ABNORMAL LOW (ref 12.0–15.0)
Immature Granulocytes: 2 %
Lymphocytes Relative: 13 %
Lymphs Abs: 0.7 10*3/uL (ref 0.7–4.0)
MCH: 30.4 pg (ref 26.0–34.0)
MCHC: 33.1 g/dL (ref 30.0–36.0)
MCV: 92 fL (ref 80.0–100.0)
Monocytes Absolute: 0.4 10*3/uL (ref 0.1–1.0)
Monocytes Relative: 7 %
Neutro Abs: 4 10*3/uL (ref 1.7–7.7)
Neutrophils Relative %: 78 %
Platelets: 115 10*3/uL — ABNORMAL LOW (ref 150–400)
RBC: 2.76 MIL/uL — ABNORMAL LOW (ref 3.87–5.11)
RDW: 16.4 % — ABNORMAL HIGH (ref 11.5–15.5)
WBC: 5.2 10*3/uL (ref 4.0–10.5)
nRBC: 0 % (ref 0.0–0.2)

## 2022-04-29 LAB — GLUCOSE, CAPILLARY
Glucose-Capillary: 180 mg/dL — ABNORMAL HIGH (ref 70–99)
Glucose-Capillary: 198 mg/dL — ABNORMAL HIGH (ref 70–99)
Glucose-Capillary: 356 mg/dL — ABNORMAL HIGH (ref 70–99)
Glucose-Capillary: 319 mg/dL — ABNORMAL HIGH (ref 70–99)

## 2022-04-29 LAB — BASIC METABOLIC PANEL
Anion gap: 7 (ref 5–15)
BUN: 98 mg/dL — ABNORMAL HIGH (ref 8–23)
CO2: 23 mmol/L (ref 22–32)
Calcium: 8.7 mg/dL — ABNORMAL LOW (ref 8.9–10.3)
Chloride: 102 mmol/L (ref 98–111)
Creatinine, Ser: 2.11 mg/dL — ABNORMAL HIGH (ref 0.44–1.00)
GFR, Estimated: 23 mL/min — ABNORMAL LOW (ref >60)
Glucose, Bld: 227 mg/dL — ABNORMAL HIGH (ref 70–99)
Potassium: 4.4 mmol/L (ref 3.5–5.1)
Sodium: 132 mmol/L — ABNORMAL LOW (ref 135–145)

## 2022-04-29 LAB — PROTIME-INR
INR: 2 — ABNORMAL HIGH (ref 0.8–1.2)
Prothrombin Time: 22.7 seconds — ABNORMAL HIGH (ref 11.4–15.2)

## 2022-04-29 MED ORDER — WARFARIN SODIUM 5 MG PO TABS
10.0000 mg | ORAL_TABLET | Freq: Once | ORAL | Status: AC
  Administered 2022-04-29: 10 mg via ORAL
  Filled 2022-04-29: qty 2

## 2022-04-29 NOTE — Progress Notes (Signed)
ANTICOAGULATION CONSULT NOTE - Follow Up Consult  Pharmacy Consult for Warfarin Indication:  afib + mech AVR  Allergies  Allergen Reactions   Flagyl [Metronidazole] Rash and Other (See Comments)    ALL-OVER BODY RASH   Ciprofloxacin Nausea Only and Other (See Comments)    Stomach ache   Coreg [Carvedilol] Other (See Comments)    Terrible cramping in the feet and had a lot of bowel movements, but not diarrhea   Diflucan [Fluconazole] Diarrhea   Losartan Swelling and Other (See Comments)    Patient doesn't recall site of swelling   Privigen [Immune Globulin (Human)] Other (See Comments)    Severe/excruciating pain   Sulfamethoxazole-Trimethoprim Nausea Only and Other (See Comments)    Stomach upset   Verapamil Hives   Zetia [Ezetimibe] Other (See Comments)    Reaction not recalled   Zocor [Simvastatin - High Dose] Other (See Comments)    Reaction not recalled   Gatifloxacin Rash and Other (See Comments)    Redness to skin around eye    Patient Measurements: Height: '4\' 11"'$  (149.9 cm) Weight: 59.4 kg (131 lb) IBW/kg (Calculated) : 43.2  Vital Signs: Temp: 97.7 F (36.5 C) (07/22 0635) Temp Source: Oral (07/22 0635) BP: 161/42 (07/22 0635) Pulse Rate: 63 (07/22 0635)  Labs: Recent Labs    04/27/22 0546 04/28/22 0431 04/29/22 0612  HGB 8.6* 8.1* 8.4*  HCT 26.3* 24.8* 25.4*  PLT 107* 105* 115*  LABPROT 15.9* 19.3* 22.7*  INR 1.3* 1.6* 2.0*  HEPARINUNFRC 0.39  --   --   CREATININE 2.18* 2.07* 2.11*    Estimated Creatinine Clearance: 15.9 mL/min (A) (by C-G formula based on SCr of 2.11 mg/dL (H)).   Assessment: AC/Heme: PTA warfarin for hx PAF + mech AVR, noted h/o hemolytic anemia. Transfused 7/14.  - Warf dose PTA: '5mg'$  QD and 7.'5mg'$  W,Fr,Su (INR 3.8 on re-admit 7/11)  - Noted hematoma on LUE > dopplers negative for DVT - INR up to 2 (goal 2-3), Hgb 8.4 stable. Plts 115 pretty stable. D/c Lovenox 7/22  Goal of Therapy:  INR 2-3 Monitor platelets by  anticoagulation protocol: Yes   Plan:  D/c Lovenox Coumadin '10mg'$  po x 1 today. Daily INR   Liller Yohn S. Alford Highland, PharmD, BCPS Clinical Staff Pharmacist [email or web address]  Alford Highland, [employer] 04/29/2022,8:40 AM

## 2022-04-29 NOTE — Progress Notes (Signed)
PROGRESS NOTE    KHORI UNDERBERG  [id number]:[id number] DOB: [birth year] DOA: 04/18/2022 PCP: [name], MD   Brief Narrative:  81 y.o. female with medical history significant of chronic diastolic HF, autoimmune hemolytic anemia, DM2, HLD, HTN, vasculitis, PAF, recent admission from 04/12/2022-04/16/2022 for emphysematous cystitis (colovesical fistula ruled out) treated with IV antibiotics and subsequent discharge on oral Augmentin presented with worsening vomiting and diarrhea with positive fevers.  On presentation, creatinine was 2.56 with WBC of 11.7.  CT of abdomen and pelvis showed possible sigmoid diverticulitis versus colitis.  She was started on broad-spectrum antibiotics.  She subsequently tested positive for C. difficile and was started on Dificid; antibiotics were discontinued.  Diarrhea is improving.  Currently on heparin bridge along with Coumadin because of subtherapeutic INR.  Assessment & Plan:   Possible C. difficile colitis/diverticulitis -Stool for C. difficile tested positive.  Imaging as above.  Broad-spectrum antibiotics discontinued on 04/19/2022.  Started Dificid on 04/19/2022: Finish 10-day course of therapy.  Diarrhea has improved.  Abdominal pain -improved after patient had bowel movements  Right foot swelling/pain -no obvious trauma -seems to have a hematoma on dorsum of foot, may be related to sandals -check foot xray to ensure no underlying fracture -keep elevated -apply ice packs to swelling  CKD stage IV -Creatinine at baseline.  Monitor  Leukocytosis -Resolved  Thrombocytopenia -monitor.  No signs of bleeding  Chronic diastolic CHF PAF on Coumadin PAH Supratherapeutic INR -Strict input and output.  Daily weights.  Fluid restriction.  Continue current regimen of Lasix along with hydralazine, Toprol-XL, spironolactone.  INR 2.0 today.  Repeat a.m. INR.  Continue Coumadin with lovenox bridge. Outpatient follow-up with cardiology. -Continue  tadalafil -continues to have good urine output  Hypoalbuminemia -likely related to acute illness -she received albumin on 7/20  History of autoimmune hemolytic anemia History of vasculitis Anemia of chronic disease -Hematology had recommended in the past to transfuse if hemoglobin is less than 8.  Status post 1 unit packed red cell transfusion on 04/21/2022.  Hemoglobin 8.1 today.  Monitor H&H.  No evidence of hemolysis.  Outpatient follow-up with oncology.  Continue prednisone.  Discussed with Dr. Iruku/hematology via secure chat on 04/21/2022 who recommended to hold mycophenolate while patient is getting treatment for C. difficile and to continue prednisone.  Recent emphysematous cystitis Acute urinary retention -CT on admission did not show evidence of cystitis.  DC'd antibiotics on 04/19/2022.  Currently has a Foley catheter  -discussed with her urologist Dr. Matilde Sprang who recommended continuing foley catheter until she can follow as outpatient next week.  Hyponatremia -Mild.  Monitor  Hypertension -continue current regimen.  Physical deconditioning -PT recommends home health PT  Moderate malnutrition -Follow nutrition recommendations  DM2, uncontrolled with hyperglycemia -home dose of glipizide on hold -continue SSI and basal insulin  DVT prophylaxis: Coumadin and heparin Code Status: DNR Family Communication: Son at bedside Disposition Plan: Status is: Inpatient Remains inpatient appropriate because: Of severity of illness. INR still subtherapeutic.  Possible discharge in 1 to 3 days once INR gets close to therapeutic range.  Consultants: Contacted urology via secure chat on 04/20/2022.  Communicated with hematology/Dr. Chryl Heck via secure chat on 04/21/2022 Procedures: None  Antimicrobials:  Anti-infectives (From admission, onward)    Start     Dose/Rate Route Frequency Ordered Stop   04/19/22 1000  fidaxomicin (DIFICID) tablet 200 mg        200 mg Oral 2 times daily  04/19/22 0810 04/29/22 0959   04/18/22 2200  piperacillin-tazobactam (ZOSYN) IVPB 3.375 g  Status:  Discontinued        3.375 g 12.5 mL/hr over 240 Minutes Intravenous Every 12 hours 04/18/22 1528 04/19/22 0823   04/18/22 1530  piperacillin-tazobactam (ZOSYN) IVPB 3.375 g  Status:  Discontinued        3.375 g 100 mL/hr over 30 Minutes Intravenous  Once 04/18/22 1528 04/18/22 1544   04/18/22 1445  piperacillin-tazobactam (ZOSYN) IVPB 3.375 g        3.375 g 100 mL/hr over 30 Minutes Intravenous  Once 04/18/22 1431 04/18/22 1518        Subjective: She had a bowel movements yesterday and abdominal pain is better today. Her main complaint is pain in her foot with swelling  Objective: Vitals:   04/28/22 1200 04/28/22 2137 04/29/22 0635 04/29/22 0853  BP: (!) 161/59 (!) 150/49 (!) 161/42 (!) 176/42  Pulse: 60 67 63 (!) 57  Resp: '17 18 18   '$ Temp: 97.7 F (36.5 C) 97.9 F (36.6 C) 97.7 F (36.5 C)   TempSrc: Oral Oral Oral   SpO2: 99% 98% 99%   Weight:      Height:        Intake/Output Summary (Last 24 hours) at 04/29/2022 1449 Last data filed at 04/29/2022 1610 Gross per 24 hour  Intake 652.5 ml  Output 2200 ml  Net -1547.5 ml   Filed Weights   04/18/22 1206  Weight: 59.4 kg    Examination:  General: No acute distress.  Still on room air.  Chronically ill and deconditioned looking ENT/neck: JVD is currently not elevated.  No neck masses or thyromegaly palpated.  Respiratory: Bilateral decreased breath sounds at bases with scattered crackles, no wheezing  CVS: Rate is mostly controlled; S1, S2 heard  abdominal: Soft, distended, she does have bowel sounds, non tender extremities: bilateral lower extremity edema CNS: Alert and awake.  No focal neurologic deficit.  Moving extremities  lymph: No lymph nodes palpable skin: Left upper arm ecchymosis is stable  psych: No signs of agitation.  Affect is mostly flat  musculoskeletal: right foot dorsal swelling, tender to  touch     Data Reviewed: I have personally reviewed following labs and imaging studies  CBC: Recent Labs  Lab 04/25/22 0547 04/26/22 0545 04/27/22 0546 04/28/22 0431 04/29/22 0612  WBC 3.7* 3.4* 3.8* 4.5 5.2  NEUTROABS 2.8 2.5 2.8 3.5 4.0  HGB 9.3* 8.7* 8.6* 8.1* 8.4*  HCT 28.5* 26.7* 26.3* 24.8* 25.4*  MCV 92.2 91.4 92.0 92.2 92.0  PLT 128* 107* 107* 105* 960*   Basic Metabolic Panel: Recent Labs  Lab 04/24/22 0503 04/25/22 0547 04/26/22 0545 04/27/22 0546 04/28/22 0431 04/29/22 0612  NA 132* 132* 127* 132* 132* 132*  K 4.4 4.6 4.4 4.6 4.5 4.4  CL 102 103 96* 102 101 102  CO2 21* 21* 21* 21* 22 23  GLUCOSE 218* 188* 279* 281* 256* 227*  BUN 80* 84* 92* 93* 96* 98*  CREATININE 2.24* 2.12* 2.10* 2.18* 2.07* 2.11*  CALCIUM 8.5* 8.5* 7.9* 8.3* 8.5* 8.7*  MG 2.0 1.7 1.8 1.8  --   --    GFR: Estimated Creatinine Clearance: 15.9 mL/min (A) (by C-G formula based on SCr of 2.11 mg/dL (H)). Liver Function Tests: Recent Labs  Lab 04/28/22 0431  AST 44*  ALT 40  ALKPHOS 63  BILITOT 0.6  PROT 5.2*  ALBUMIN 3.1*    No results for input(s): "LIPASE", "AMYLASE" in the last 168 hours. No results for input(s): "AMMONIA" in  the last 168 hours. Coagulation Profile: Recent Labs  Lab 04/25/22 0547 04/26/22 0545 04/27/22 0546 04/28/22 0431 04/29/22 0612  INR 1.4* 1.5* 1.3* 1.6* 2.0*   Cardiac Enzymes: No results for input(s): "CKTOTAL", "CKMB", "CKMBINDEX", "TROPONINI" in the last 168 hours. BNP (last 3 results) No results for input(s): "PROBNP" in the last 8760 hours. HbA1C: Recent Labs    04/27/22 0546  HGBA1C 6.3*   CBG: Recent Labs  Lab 04/28/22 1201 04/28/22 1646 04/28/22 2129 04/29/22 0800 04/29/22 1143  GLUCAP 208* 266* 368* 180* 198*   Lipid Profile: No results for input(s): "CHOL", "HDL", "LDLCALC", "TRIG", "CHOLHDL", "LDLDIRECT" in the last 72 hours. Thyroid Function Tests: No results for input(s): "TSH", "T4TOTAL", "FREET4", "T3FREE",  "THYROIDAB" in the last 72 hours. Anemia Panel: No results for input(s): "VITAMINB12", "FOLATE", "FERRITIN", "TIBC", "IRON", "RETICCTPCT" in the last 72 hours. Sepsis Labs: No results for input(s): "PROCALCITON", "LATICACIDVEN" in the last 168 hours.  No results found for this or any previous visit (from the past 240 hour(s)).        Radiology Studies: No results found.      Scheduled Meds:  allopurinol  100 mg Oral Daily   ALPRAZolam  0.25 mg Oral QHS   amLODipine  10 mg Oral Daily   aspirin EC  81 mg Oral Daily   atorvastatin  80 mg Oral q1800   Chlorhexidine Gluconate Cloth  6 each Topical Daily   cloNIDine  0.2 mg Oral BID   folic acid  1 mg Oral Daily   furosemide  80 mg Oral Daily   And   furosemide  40 mg Oral QPM   hydrALAZINE  100 mg Oral TID   insulin aspart  0-5 Units Subcutaneous QHS   insulin aspart  0-9 Units Subcutaneous TID WC   insulin aspart  3 Units Subcutaneous TID WC   insulin glargine-yfgn  12 Units Subcutaneous Daily   lactose free nutrition  237 mL Oral Q24H   metoprolol succinate  12.5 mg Oral Daily   mirabegron ER  25 mg Oral Daily   pantoprazole  40 mg Oral Daily   predniSONE  20 mg Oral Q breakfast   spironolactone  25 mg Oral Daily   tadalafil  40 mg Oral QPM   warfarin  10 mg Oral ONCE-1600   Warfarin - Pharmacist Dosing Inpatient   Does not apply q1600   Continuous Infusions:          Kathie Dike, MD [facility] 04/29/2022, 2:49 PM

## 2022-04-30 DIAGNOSIS — Z7901 Long term (current) use of anticoagulants: Secondary | ICD-10-CM | POA: Diagnosis not present

## 2022-04-30 DIAGNOSIS — K529 Noninfective gastroenteritis and colitis, unspecified: Secondary | ICD-10-CM | POA: Diagnosis not present

## 2022-04-30 DIAGNOSIS — I5032 Chronic diastolic (congestive) heart failure: Secondary | ICD-10-CM | POA: Diagnosis not present

## 2022-04-30 DIAGNOSIS — D591 Autoimmune hemolytic anemia, unspecified: Secondary | ICD-10-CM | POA: Diagnosis not present

## 2022-04-30 LAB — CBC WITH DIFFERENTIAL/PLATELET
Abs Immature Granulocytes: 0.1 10*3/uL — ABNORMAL HIGH (ref 0.00–0.07)
Basophils Absolute: 0 10*3/uL (ref 0.0–0.1)
Basophils Relative: 0 %
Eosinophils Absolute: 0 10*3/uL (ref 0.0–0.5)
Eosinophils Relative: 1 %
HCT: 26.1 % — ABNORMAL LOW (ref 36.0–46.0)
Hemoglobin: 8.4 g/dL — ABNORMAL LOW (ref 12.0–15.0)
Immature Granulocytes: 2 %
Lymphocytes Relative: 14 %
Lymphs Abs: 0.7 10*3/uL (ref 0.7–4.0)
MCH: 29.8 pg (ref 26.0–34.0)
MCHC: 32.2 g/dL (ref 30.0–36.0)
MCV: 92.6 fL (ref 80.0–100.0)
Monocytes Absolute: 0.4 10*3/uL (ref 0.1–1.0)
Monocytes Relative: 7 %
Neutro Abs: 3.9 10*3/uL (ref 1.7–7.7)
Neutrophils Relative %: 76 %
Platelets: 125 10*3/uL — ABNORMAL LOW (ref 150–400)
RBC: 2.82 MIL/uL — ABNORMAL LOW (ref 3.87–5.11)
RDW: 16.6 % — ABNORMAL HIGH (ref 11.5–15.5)
WBC: 5.1 10*3/uL (ref 4.0–10.5)
nRBC: 0 % (ref 0.0–0.2)

## 2022-04-30 LAB — PROTIME-INR
INR: 2.3 — ABNORMAL HIGH (ref 0.8–1.2)
Prothrombin Time: 25.4 seconds — ABNORMAL HIGH (ref 11.4–15.2)

## 2022-04-30 LAB — GLUCOSE, CAPILLARY
Glucose-Capillary: 251 mg/dL — ABNORMAL HIGH (ref 70–99)
Glucose-Capillary: 161 mg/dL — ABNORMAL HIGH (ref 70–99)
Glucose-Capillary: 311 mg/dL — ABNORMAL HIGH (ref 70–99)
Glucose-Capillary: 311 mg/dL — ABNORMAL HIGH (ref 70–99)

## 2022-04-30 LAB — BASIC METABOLIC PANEL
Anion gap: 11 (ref 5–15)
BUN: 97 mg/dL — ABNORMAL HIGH (ref 8–23)
CO2: 22 mmol/L (ref 22–32)
Calcium: 8.6 mg/dL — ABNORMAL LOW (ref 8.9–10.3)
Chloride: 101 mmol/L (ref 98–111)
Creatinine, Ser: 1.97 mg/dL — ABNORMAL HIGH (ref 0.44–1.00)
GFR, Estimated: 25 mL/min — ABNORMAL LOW (ref >60)
Glucose, Bld: 210 mg/dL — ABNORMAL HIGH (ref 70–99)
Potassium: 4.4 mmol/L (ref 3.5–5.1)
Sodium: 134 mmol/L — ABNORMAL LOW (ref 135–145)

## 2022-04-30 MED ORDER — WARFARIN SODIUM 2.5 MG PO TABS
7.5000 mg | ORAL_TABLET | Freq: Once | ORAL | Status: AC
Start: 2022-04-30 — End: 2022-04-30
  Administered 2022-04-30: 7.5 mg via ORAL
  Filled 2022-04-30: qty 1

## 2022-04-30 MED ORDER — POLYETHYLENE GLYCOL 3350 17 G PO PACK
17.0000 g | PACK | Freq: Two times a day (BID) | ORAL | Status: DC
  Administered 2022-04-30 – 2022-05-01 (×4): 17 g via ORAL
  Filled 2022-04-30 (×4): qty 1

## 2022-04-30 MED ORDER — BISACODYL 10 MG RE SUPP
10.0000 mg | Freq: Every day | RECTAL | Status: DC | PRN
Start: 2022-04-30 — End: 2022-05-02

## 2022-04-30 NOTE — Progress Notes (Signed)
ANTICOAGULATION CONSULT NOTE - Follow Up Consult  Pharmacy Consult for Warfarin Indication:  afib + mech AVR  Allergies  Allergen Reactions   Flagyl [Metronidazole] Rash and Other (See Comments)    ALL-OVER BODY RASH   Ciprofloxacin Nausea Only and Other (See Comments)    Stomach ache   Coreg [Carvedilol] Other (See Comments)    Terrible cramping in the feet and had a lot of bowel movements, but not diarrhea   Diflucan [Fluconazole] Diarrhea   Losartan Swelling and Other (See Comments)    Patient doesn't recall site of swelling   Privigen [Immune Globulin (Human)] Other (See Comments)    Severe/excruciating pain   Sulfamethoxazole-Trimethoprim Nausea Only and Other (See Comments)    Stomach upset   Verapamil Hives   Zetia [Ezetimibe] Other (See Comments)    Reaction not recalled   Zocor [Simvastatin - High Dose] Other (See Comments)    Reaction not recalled   Gatifloxacin Rash and Other (See Comments)    Redness to skin around eye    Patient Measurements: Height: '4\' 11"'$  (149.9 cm) Weight: 59.4 kg (131 lb) IBW/kg (Calculated) : 43.2  Vital Signs: Temp: 97.8 F (36.6 C) (07/23 0519) Temp Source: Oral (07/23 0519) BP: 148/44 (07/23 0519) Pulse Rate: 71 (07/23 0519)  Labs: Recent Labs    04/28/22 0431 04/29/22 0612 04/30/22 0537  HGB 8.1* 8.4* 8.4*  HCT 24.8* 25.4* 26.1*  PLT 105* 115* 125*  LABPROT 19.3* 22.7* 25.4*  INR 1.6* 2.0* 2.3*  CREATININE 2.07* 2.11* 1.97*     Estimated Creatinine Clearance: 17 mL/min (A) (by C-G formula based on SCr of 1.97 mg/dL (H)).   Assessment: AC/Heme: PTA warfarin for hx PAF + mech AVR, noted h/o hemolytic anemia. Transfused 7/14.  - Warf dose PTA: '5mg'$  QD and 7.'5mg'$  W,Fr,Su (INR 3.8 on re-admit 7/11)  - Noted hematoma on LUE > dopplers negative for DVT --seems to have a hematoma on dorsum of foot, may be related to sandals - INR up to 2.3 (goal 2-3), Hgb 8.4 stable. Plts 125 pretty stable. D/c Lovenox 7/22  Goal of  Therapy:  INR 2-3 Monitor platelets by anticoagulation protocol: Yes   Plan:  - Warfarin 7.'5mg'$  po x 1 today. - Likely can resume home regimen - Daily INR   [name] S. Alford Highland, PharmD, BCPS Clinical Staff Pharmacist [email or web address]  Alford Highland, Lennon Boutwell Stillinger 04/30/2022,8:27 AM

## 2022-04-30 NOTE — Progress Notes (Signed)
PROGRESS NOTE    [name]  OVZ:[id number] DOB: 19-Jun-1941 DOA: 04/18/2022 PCP: [name], MD   Brief Narrative:  81 y.o. female with medical history significant of chronic diastolic HF, autoimmune hemolytic anemia, DM2, HLD, HTN, vasculitis, PAF, recent admission from 04/12/2022-04/16/2022 for emphysematous cystitis (colovesical fistula ruled out) treated with IV antibiotics and subsequent discharge on oral Augmentin presented with worsening vomiting and diarrhea with positive fevers.  On presentation, creatinine was 2.56 with WBC of 11.7.  CT of abdomen and pelvis showed possible sigmoid diverticulitis versus colitis.  She was started on broad-spectrum antibiotics.  She subsequently tested positive for C. difficile and was started on Dificid; antibiotics were discontinued.  Diarrhea is improving.  Currently on heparin bridge along with Coumadin because of subtherapeutic INR.  Assessment & Plan:   Possible C. difficile colitis/diverticulitis -Stool for C. difficile tested positive.  Imaging as above.  Broad-spectrum antibiotics discontinued on 04/19/2022.  Started Dificid on 04/19/2022: Finish 10-day course of therapy.  Diarrhea has improved.  Abdominal pain -noted to have pain in rectal area and abdominal distention -required enema on 7/21 -start on bid miralax -will provide another suppository today if she does not have a bowel movement  Right foot swelling/pain -no obvious trauma -seems to have a hematoma on dorsum of foot, may be related to sandals -xray did not show any bony abnormalities -keep elevated -apply ice packs to swelling  CKD stage IV -Creatinine at baseline.  Monitor  Leukocytosis -Resolved  Thrombocytopenia -monitor.  No signs of bleeding  Chronic diastolic CHF PAF on Coumadin PAH Supratherapeutic INR -Strict input and output.  Daily weights.  Fluid restriction.  Continue current regimen of Lasix along with hydralazine, Toprol-XL, spironolactone.   INR 2.3 today.  Repeat a.m. INR.  Continue Coumadin. Outpatient follow-up with cardiology. -Continue tadalafil -continues to have good urine output  Hypoalbuminemia -likely related to acute illness -she received albumin on 7/20  History of autoimmune hemolytic anemia History of vasculitis Anemia of chronic disease -Hematology had recommended in the past to transfuse if hemoglobin is less than 8.  Status post 1 unit packed red cell transfusion on 04/21/2022.  Hemoglobin 8.1 today.  Monitor H&H.  No evidence of hemolysis.  Outpatient follow-up with oncology.  Continue prednisone.  Discussed with Dr. Iruku/hematology via secure chat on 04/21/2022 who recommended to hold mycophenolate while patient is getting treatment for C. difficile and to continue prednisone.  Recent emphysematous cystitis Acute urinary retention -CT on admission did not show evidence of cystitis.  DC'd antibiotics on 04/19/2022.  Currently has a Foley catheter  -discussed with her urologist Dr. Matilde Sprang who recommended continuing foley catheter until she can follow as outpatient next week.  Hyponatremia -Mild.  Monitor  Hypertension -continue current regimen.  Physical deconditioning -PT recommends home health PT  Moderate malnutrition -Follow nutrition recommendations  DM2, uncontrolled with hyperglycemia -home dose of glipizide on hold -continue SSI and basal insulin  DVT prophylaxis: Coumadin  Code Status: DNR Family Communication: daughter at bedside Disposition Plan: Status is: Inpatient Remains inpatient appropriate because: Of severity of illness. Hopeful for discharge tomorrow if bowel movements regulate and able to ambulate  Consultants: Contacted urology via secure chat on 04/20/2022.  Communicated with hematology/Dr. Chryl Heck via secure chat on 04/21/2022 Procedures: None  Antimicrobials:  Anti-infectives (From admission, onward)    Start     Dose/Rate Route Frequency Ordered Stop   04/19/22 1000   fidaxomicin (DIFICID) tablet 200 mg        200  mg Oral 2 times daily 04/19/22 0810 04/29/22 0959   04/18/22 2200  piperacillin-tazobactam (ZOSYN) IVPB 3.375 g  Status:  Discontinued        3.375 g 12.5 mL/hr over 240 Minutes Intravenous Every 12 hours 04/18/22 1528 04/19/22 0823   04/18/22 1530  piperacillin-tazobactam (ZOSYN) IVPB 3.375 g  Status:  Discontinued        3.375 g 100 mL/hr over 30 Minutes Intravenous  Once 04/18/22 1528 04/18/22 1544   04/18/22 1445  piperacillin-tazobactam (ZOSYN) IVPB 3.375 g        3.375 g 100 mL/hr over 30 Minutes Intravenous  Once 04/18/22 1431 04/18/22 1518        Subjective: Last BM yesterday morning. Starting to feel discomfort in rectum again and abd distention. She is passing gas. Continues to have pain in her foot, although may be a little better today  Objective: Vitals:   04/29/22 1548 04/29/22 2042 04/30/22 0519 04/30/22 0916  BP: (!) 142/51 (!) 176/51 (!) 148/44 (!) 161/51  Pulse: 66 74 71 63  Resp: '18 20 19 16  '$ Temp: 98 F (36.7 C) 97.9 F (36.6 C) 97.8 F (36.6 C)   TempSrc: Oral Oral Oral   SpO2: 99% 99% 98% 99%  Weight:      Height:        Intake/Output Summary (Last 24 hours) at 04/30/2022 1410 Last data filed at 04/30/2022 0830 Gross per 24 hour  Intake 1440 ml  Output 2500 ml  Net -1060 ml   Filed Weights   04/18/22 1206  Weight: 59.4 kg    Examination:  General: No acute distress.  Still on room air.  Chronically ill and deconditioned looking ENT/neck: JVD is currently not elevated.  No neck masses or thyromegaly palpated.  Respiratory: Bilateral decreased breath sounds at bases with scattered crackles, no wheezing  CVS: Rate is mostly controlled; S1, S2 heard  abdominal: Soft, distended, she does have bowel sounds, non tender extremities: bilateral lower extremity edema, improving CNS: Alert and awake.  No focal neurologic deficit.  Moving extremities  lymph: No lymph nodes palpable skin: Left upper arm  ecchymosis is stable  psych: No signs of agitation.  Affect is mostly flat  musculoskeletal: right foot dorsal swelling, tender to touch     Data Reviewed: I have personally reviewed following labs and imaging studies  CBC: Recent Labs  Lab 04/26/22 0545 04/27/22 0546 04/28/22 0431 04/29/22 0612 04/30/22 0537  WBC 3.4* 3.8* 4.5 5.2 5.1  NEUTROABS 2.5 2.8 3.5 4.0 3.9  HGB 8.7* 8.6* 8.1* 8.4* 8.4*  HCT 26.7* 26.3* 24.8* 25.4* 26.1*  MCV 91.4 92.0 92.2 92.0 92.6  PLT 107* 107* 105* 115* 702*   Basic Metabolic Panel: Recent Labs  Lab 04/24/22 0503 04/25/22 0547 04/26/22 0545 04/27/22 0546 04/28/22 0431 04/29/22 0612 04/30/22 0537  NA 132* 132* 127* 132* 132* 132* 134*  K 4.4 4.6 4.4 4.6 4.5 4.4 4.4  CL 102 103 96* 102 101 102 101  CO2 21* 21* 21* 21* '22 23 22  '$ GLUCOSE 218* 188* 279* 281* 256* 227* 210*  BUN 80* 84* 92* 93* 96* 98* 97*  CREATININE 2.24* 2.12* 2.10* 2.18* 2.07* 2.11* 1.97*  CALCIUM 8.5* 8.5* 7.9* 8.3* 8.5* 8.7* 8.6*  MG 2.0 1.7 1.8 1.8  --   --   --    GFR: Estimated Creatinine Clearance: 17 mL/min (A) (by C-G formula based on SCr of 1.97 mg/dL (H)). Liver Function Tests: Recent Labs  Lab 04/28/22 0431  AST 44*  ALT 40  ALKPHOS 63  BILITOT 0.6  PROT 5.2*  ALBUMIN 3.1*    No results for input(s): "LIPASE", "AMYLASE" in the last 168 hours. No results for input(s): "AMMONIA" in the last 168 hours. Coagulation Profile: Recent Labs  Lab 04/26/22 0545 04/27/22 0546 04/28/22 0431 04/29/22 0612 04/30/22 0537  INR 1.5* 1.3* 1.6* 2.0* 2.3*   Cardiac Enzymes: No results for input(s): "CKTOTAL", "CKMB", "CKMBINDEX", "TROPONINI" in the last 168 hours. BNP (last 3 results) No results for input(s): "PROBNP" in the last 8760 hours. HbA1C: No results for input(s): "HGBA1C" in the last 72 hours.  CBG: Recent Labs  Lab 04/29/22 1143 04/29/22 1715 04/29/22 2040 04/30/22 0831 04/30/22 1232  GLUCAP 198* 356* 319* 251* 161*   Lipid  Profile: No results for input(s): "CHOL", "HDL", "LDLCALC", "TRIG", "CHOLHDL", "LDLDIRECT" in the last 72 hours. Thyroid Function Tests: No results for input(s): "TSH", "T4TOTAL", "FREET4", "T3FREE", "THYROIDAB" in the last 72 hours. Anemia Panel: No results for input(s): "VITAMINB12", "FOLATE", "FERRITIN", "TIBC", "IRON", "RETICCTPCT" in the last 72 hours. Sepsis Labs: No results for input(s): "PROCALCITON", "LATICACIDVEN" in the last 168 hours.  No results found for this or any previous visit (from the past 240 hour(s)).        Radiology Studies: DG Foot 2 Views Right  Result Date: 04/29/2022 CLINICAL DATA:  Right foot swelling and pain noticed yesterday. Possibly from sandals. Hematoma on dorsum of right foot. EXAM: RIGHT FOOT - 2 VIEW COMPARISON:  None Available. FINDINGS: There is moderate to high-grade focal soft tissue enlargement/swelling of the dorsal midfoot at the level of the metatarsal shafts corresponding to the clinically described hematoma. There is diffuse decreased bone mineralization. Mild-to-moderate joint space narrowing of the interphalangeal joints diffusely. Mild posterior tibiotalar joint space narrowing. No acute fracture is seen. No dislocation. Moderate vascular calcifications. IMPRESSION: 1. No acute fracture is seen. 2. There is moderate to high-grade focal soft tissue enlargement/swelling of the dorsal midfoot corresponding to the clinically described hematoma. Electronically Signed   By: Yvonne Kendall M.D.   On: 04/29/2022 15:08        Scheduled Meds:  allopurinol  100 mg Oral Daily   ALPRAZolam  0.25 mg Oral QHS   amLODipine  10 mg Oral Daily   aspirin EC  81 mg Oral Daily   atorvastatin  80 mg Oral q1800   Chlorhexidine Gluconate Cloth  6 each Topical Daily   cloNIDine  0.2 mg Oral BID   folic acid  1 mg Oral Daily   furosemide  80 mg Oral Daily   And   furosemide  40 mg Oral QPM   hydrALAZINE  100 mg Oral TID   insulin aspart  0-5 Units  Subcutaneous QHS   insulin aspart  0-9 Units Subcutaneous TID WC   insulin aspart  3 Units Subcutaneous TID WC   insulin glargine-yfgn  12 Units Subcutaneous Daily   lactose free nutrition  237 mL Oral Q24H   metoprolol succinate  12.5 mg Oral Daily   mirabegron ER  25 mg Oral Daily   pantoprazole  40 mg Oral Daily   polyethylene glycol  17 g Oral BID   predniSONE  20 mg Oral Q breakfast   spironolactone  25 mg Oral Daily   tadalafil  40 mg Oral QPM   warfarin  7.5 mg Oral ONCE-1600   Warfarin - Pharmacist Dosing Inpatient   Does not apply q1600   Continuous Infusions:  Kathie Dike, MD [facility] 04/30/2022, 2:10 PM

## 2022-05-01 ENCOUNTER — Ambulatory Visit: Payer: Self-pay

## 2022-05-01 ENCOUNTER — Other Ambulatory Visit: Payer: Self-pay | Admitting: Cardiovascular Disease

## 2022-05-01 DIAGNOSIS — K5792 Diverticulitis of intestine, part unspecified, without perforation or abscess without bleeding: Secondary | ICD-10-CM | POA: Diagnosis not present

## 2022-05-01 DIAGNOSIS — A0472 Enterocolitis due to Clostridium difficile, not specified as recurrent: Secondary | ICD-10-CM | POA: Diagnosis not present

## 2022-05-01 DIAGNOSIS — K529 Noninfective gastroenteritis and colitis, unspecified: Secondary | ICD-10-CM | POA: Diagnosis not present

## 2022-05-01 DIAGNOSIS — D591 Autoimmune hemolytic anemia, unspecified: Secondary | ICD-10-CM | POA: Diagnosis not present

## 2022-05-01 LAB — CBC WITH DIFFERENTIAL/PLATELET
Abs Immature Granulocytes: 0.1 10*3/uL — ABNORMAL HIGH (ref 0.00–0.07)
Basophils Absolute: 0 10*3/uL (ref 0.0–0.1)
Basophils Relative: 0 %
Eosinophils Absolute: 0.1 10*3/uL (ref 0.0–0.5)
Eosinophils Relative: 1 %
HCT: 26.2 % — ABNORMAL LOW (ref 36.0–46.0)
Hemoglobin: 8.5 g/dL — ABNORMAL LOW (ref 12.0–15.0)
Immature Granulocytes: 2 %
Lymphocytes Relative: 13 %
Lymphs Abs: 0.6 10*3/uL — ABNORMAL LOW (ref 0.7–4.0)
MCH: 30.4 pg (ref 26.0–34.0)
MCHC: 32.4 g/dL (ref 30.0–36.0)
MCV: 93.6 fL (ref 80.0–100.0)
Monocytes Absolute: 0.3 10*3/uL (ref 0.1–1.0)
Monocytes Relative: 7 %
Neutro Abs: 3.7 10*3/uL (ref 1.7–7.7)
Neutrophils Relative %: 77 %
Platelets: 122 10*3/uL — ABNORMAL LOW (ref 150–400)
RBC: 2.8 MIL/uL — ABNORMAL LOW (ref 3.87–5.11)
RDW: 16.8 % — ABNORMAL HIGH (ref 11.5–15.5)
WBC: 4.8 10*3/uL (ref 4.0–10.5)
nRBC: 0 % (ref 0.0–0.2)

## 2022-05-01 LAB — PROTIME-INR
INR: 2.6 — ABNORMAL HIGH (ref 0.8–1.2)
Prothrombin Time: 27.8 seconds — ABNORMAL HIGH (ref 11.4–15.2)

## 2022-05-01 LAB — GLUCOSE, CAPILLARY
Glucose-Capillary: 150 mg/dL — ABNORMAL HIGH (ref 70–99)
Glucose-Capillary: 191 mg/dL — ABNORMAL HIGH (ref 70–99)
Glucose-Capillary: 296 mg/dL — ABNORMAL HIGH (ref 70–99)
Glucose-Capillary: 284 mg/dL — ABNORMAL HIGH (ref 70–99)

## 2022-05-01 MED ORDER — WARFARIN SODIUM 5 MG PO TABS
5.0000 mg | ORAL_TABLET | Freq: Once | ORAL | Status: AC
Start: 2022-05-01 — End: 2022-05-01
  Administered 2022-05-01: 5 mg via ORAL
  Filled 2022-05-01: qty 1

## 2022-05-01 MED ORDER — POLYETHYLENE GLYCOL 3350 17 G PO PACK: 17.0000 g | PACK | Freq: Two times a day (BID) | ORAL | 0 refills | Status: AC

## 2022-05-01 NOTE — Progress Notes (Signed)
Orthopedic Tech Progress Note Patient Details:  [name] 01-Apr-1941 [id number]  Ortho Devices Type of Ortho Device: Postop shoe/boot Ortho Device/Splint Location: right Ortho Device/Splint Interventions: Application   Post Interventions Patient Tolerated: Well Instructions Provided: Care of device  [name] 05/01/2022, 7:51 PM

## 2022-05-01 NOTE — Progress Notes (Signed)
ANTICOAGULATION CONSULT NOTE - Follow Up Consult  Pharmacy Consult for Warfarin Indication:  afib + mech AVR  Allergies  Allergen Reactions   Flagyl [Metronidazole] Rash and Other (See Comments)    ALL-OVER BODY RASH   Ciprofloxacin Nausea Only and Other (See Comments)    Stomach ache   Coreg [Carvedilol] Other (See Comments)    Terrible cramping in the feet and had a lot of bowel movements, but not diarrhea   Diflucan [Fluconazole] Diarrhea   Losartan Swelling and Other (See Comments)    Patient doesn't recall site of swelling   Privigen [Immune Globulin (Human)] Other (See Comments)    Severe/excruciating pain   Sulfamethoxazole-Trimethoprim Nausea Only and Other (See Comments)    Stomach upset   Verapamil Hives   Zetia [Ezetimibe] Other (See Comments)    Reaction not recalled   Zocor [Simvastatin - High Dose] Other (See Comments)    Reaction not recalled   Gatifloxacin Rash and Other (See Comments)    Redness to skin around eye    Patient Measurements: Height: '4\' 11"'$  (149.9 cm) Weight: 59.4 kg (131 lb) IBW/kg (Calculated) : 43.2  Vital Signs: Temp: 97.8 F (36.6 C) (07/24 0359) BP: 155/67 (07/24 0359) Pulse Rate: 67 (07/24 0359)  Labs: Recent Labs    04/29/22 0612 04/30/22 0537 05/01/22 0502  HGB 8.4* 8.4* 8.5*  HCT 25.4* 26.1* 26.2*  PLT 115* 125* 122*  LABPROT 22.7* 25.4* 27.8*  INR 2.0* 2.3* 2.6*  CREATININE 2.11* 1.97*  --      Estimated Creatinine Clearance: 17 mL/min (A) (by C-G formula based on SCr of 1.97 mg/dL (H)).   Assessment: 62 YOWF admitted for persistent diverticulitis. w/ hx of PAF and mechanical AVR on warfarin PTA, CAD (CABG 2005), PAH, CHF, HTN, DM, vasculitis, and autoimmune hemolytic anemia. Baseline INR supratherapeutic. Intially INR bridged w/ heparin, switched to enoxaparin d/t limited IV access.  Pharmacy consulted to dose warfarin/enoxaparin.  Today, 05/01/2022 - INR 2.6, therapeutic - CBC: Hgb low but stable (transfused  7/14), Plts 125 also low but stable. - Per MD note, seems to have a hematoma on dorsum of foot, may be related to sandals - No drug-drug interactions noted - Tolerating reg diet and supplements  Goal of Therapy:  INR 2-3 Monitor platelets by anticoagulation protocol: Yes   Plan:  - Warfarin '5mg'$  po x 1 today. - Likely can resume home regimen - Daily INR  Peggyann Juba, PharmD, BCPS Pharmacy: [phone] 05/01/2022,7:07 AM

## 2022-05-01 NOTE — Discharge Summary (Signed)
Physician Discharge Summary  [name] HLK:[id number] DOB: October 17, 1940 DOA: 04/18/2022  PCP: [name], MD  Admit date: 04/18/2022 Discharge date: 05/02/2022  Admitted From: Home Disposition: Home  Recommendations for Outpatient Follow-up:  Follow up with PCP in 1-2 weeks Please obtain BMP/CBC in one week Follow-up with hematology for further management of hemolytic anemia Follow-up with urology for further management of Foley catheter on 05/15/22  Home Health: Home health PT, OT Equipment/Devices:  Discharge Condition: Stable CODE STATUS: DNR Diet recommendation: Heart healthy  Brief/Interim Summary: 81 y.o. female with medical history significant of chronic diastolic HF, autoimmune hemolytic anemia, DM2, HLD, HTN, vasculitis, PAF, recent admission from 04/12/2022-04/16/2022 for emphysematous cystitis (colovesical fistula ruled out) treated with IV antibiotics and subsequent discharge on oral Augmentin presented with worsening vomiting and diarrhea with positive fevers.  On presentation, creatinine was 2.56 with WBC of 11.7.  CT of abdomen and pelvis showed possible sigmoid diverticulitis versus colitis.  She was started on broad-spectrum antibiotics.  She subsequently tested positive for C. difficile and was started on Dificid; antibiotics were discontinued.  Diarrhea has since resolved  Discharge Diagnoses:  Principal Problem:   Colitis Active Problems:   Emphysematous cystitis   Autoimmune hemolytic anemia (HCC)   Hypertension   Hypercholesteremia   Chronic anticoagulation   Diabetes mellitus without complication (HCC)   Thrombocytopenia (HCC)   GERD (gastroesophageal reflux disease)   Chronic diastolic CHF (congestive heart failure) (HCC)   PAF (paroxysmal atrial fibrillation) (HCC)   Vasculitis, ANCA positive   CKD (chronic kidney disease), stage IV (HCC)   PAH (pulmonary artery hypertension) (HCC)   C. difficile colitis  C. difficile  colitis/diverticulitis -Stool for C. difficile tested positive.  Imaging as above.  Broad-spectrum antibiotics discontinued on 04/19/2022.  Started Dificid on 04/19/2022: Finished 10-day course of therapy.  Diarrhea has improved.   Abdominal pain -noted to have pain in rectal area and abdominal distention -required enema on 7/21 -start on bid miralax -She is moving her bowels with improvement of her symptoms   Right foot swelling/pain -no obvious trauma -seems to have a hematoma on dorsum of foot, may be related to sandals -xray did not show any bony abnormalities -keep elevated -apply ice packs to swelling   CKD stage IV -Creatinine at baseline.  Monitor  Leukocytosis -Resolved  Thrombocytopenia -monitor.  No signs of bleeding   Chronic diastolic CHF PAF on Coumadin PAH Supratherapeutic INR -Strict input and output.  Daily weights.  Fluid restriction.  Continue current regimen of Lasix along with hydralazine, Toprol-XL, spironolactone.  INR 2.6 today.  Repeat a.m. INR.  Continue Coumadin. Outpatient follow-up with cardiology. -Continue tadalafil -continues to have good urine output   Hypoalbuminemia -likely related to acute illness -she received albumin on 7/20   History of autoimmune hemolytic anemia History of vasculitis Anemia of chronic disease -Hematology had recommended in the past to transfuse if hemoglobin is less than 8.  Status post 1 unit packed red cell transfusion on 04/21/2022.  Hemoglobin 8.1 today.  Monitor H&H.  No evidence of hemolysis.  Outpatient follow-up with oncology.  Continue prednisone.  Discussed with Dr. Iruku/hematology via secure chat on 04/21/2022 who recommended to hold mycophenolate while patient is getting treatment for C. difficile and to continue prednisone. -Continue follow-up with hematology as an outpatient   Recent emphysematous cystitis Acute urinary retention -CT on admission did not show evidence of cystitis.  DC'd antibiotics on  04/19/2022.  Currently has a Foley catheter  -discussed with her urologist  Dr. Matilde Sprang who recommended continuing foley catheter until she can follow as outpatient next week.   Hyponatremia -Mild.  Monitor   Hypertension -continue current regimen.   Physical deconditioning -PT recommends home health PT   Moderate malnutrition -Follow nutrition recommendations   DM2, uncontrolled with hyperglycemia -Resume glipizide on discharge -continue SSI and basal insulin  Dispo -Patient currently stable for discharge -She has decided instead of returning home, she will be staying with her daughter which would be better for her safety -Unfortunately, her daughter would not be able to pick her up till tomorrow -We will plan on discharge home in the morning  Discharge Instructions   Allergies as of 05/01/2022       Reactions   Flagyl [metronidazole] Rash, Other (See Comments)   ALL-OVER BODY RASH   Ciprofloxacin Nausea Only, Other (See Comments)   Stomach ache   Coreg [carvedilol] Other (See Comments)   Terrible cramping in the feet and had a lot of bowel movements, but not diarrhea   Diflucan [fluconazole] Diarrhea   Losartan Swelling, Other (See Comments)   Patient doesn't recall site of swelling   Privigen [immune Globulin (human)] Other (See Comments)   Severe/excruciating pain   Sulfamethoxazole-trimethoprim Nausea Only, Other (See Comments)   Stomach upset   Verapamil Hives   Zetia [ezetimibe] Other (See Comments)   Reaction not recalled   Zocor [simvastatin - High Dose] Other (See Comments)   Reaction not recalled   Gatifloxacin Rash, Other (See Comments)   Redness to skin around eye        Medication List     STOP taking these medications    amoxicillin-clavulanate 500-125 MG tablet Commonly known as: AUGMENTIN       TAKE these medications    acetaminophen 325 MG tablet Commonly known as: TYLENOL Take 650 mg by mouth every 6 (six) hours as needed for  mild pain or fever.   alendronate 70 MG tablet Commonly known as: FOSAMAX Take 70 mg by mouth every Saturday.   allopurinol 100 MG tablet Commonly known as: ZYLOPRIM Take 100 mg by mouth daily.   ALPRAZolam 0.5 MG tablet Commonly known as: XANAX Take 0.25-0.5 mg by mouth at bedtime.   amLODipine 10 MG tablet Commonly known as: NORVASC TAKE 1 TABLET EVERY DAY   aspirin EC 81 MG tablet Take 81 mg by mouth daily. Swallow whole.   atorvastatin 80 MG tablet Commonly known as: LIPITOR Take 1 tablet (80 mg total) by mouth daily at 6 PM.   B-D INS SYR ULTRAFINE .3CC/29G 29G X 1/2" 0.3 ML Misc Generic drug: Insulin Syringe-Needle U-100 Use for sliding scale for breakthrough high blood sugar readings   cloNIDine 0.2 MG tablet Commonly known as: CATAPRES TAKE 1 TABLET TWICE DAILY   estradiol 0.1 MG/GM vaginal cream Commonly known as: ESTRACE Place 0.5g nightly for two weeks then twice a week after What changed:  how much to take how to take this when to take this additional instructions   famotidine 20 MG tablet Commonly known as: PEPCID Take 1 tablet (20 mg total) by mouth 2 (two) times daily.   folic acid 1 MG tablet Commonly known as: FOLVITE Take 1 mg by mouth daily.   furosemide 40 MG tablet Commonly known as: LASIX TAKE 2 TABLETS (80 MG TOTAL) IN MORNING AND 1 AND 1/2 TABLETS (60 MG TOTAL) IN EVENING. CHANGE IN DOSAGE. What changed: See the new instructions.   glipiZIDE 5 MG tablet Commonly known as: Glucotrol Take 1  tablet (5 mg total) by mouth daily.   hydrALAZINE 100 MG tablet Commonly known as: APRESOLINE TAKE 1 TABLET THREE TIMES DAILY   insulin aspart 100 UNIT/ML injection Commonly known as: novoLOG Take 2 units for blood sugar 200-250, 4 units for blood sugar 252-300, 6 units for blood sugar 301-350, 8 units for blood sugar greater than 350 and call MD. What changed:  how much to take how to take this when to take this additional instructions    lactose free nutrition Liqd Take 237 mLs by mouth daily with supper. (To supplement meals)   lidocaine 2 % solution Commonly known as: XYLOCAINE Use as directed 15 mLs in the mouth or throat as needed for mouth pain. What changed: when to take this   magnesium oxide 400 (241.3 Mg) MG tablet Commonly known as: MAG-OX Take 1 tablet (400 mg total) by mouth daily.   metoprolol succinate 25 MG 24 hr tablet Commonly known as: TOPROL-XL TAKE 1/2 TABLET EVERY DAY   mirabegron ER 25 MG Tb24 tablet Commonly known as: Myrbetriq Take 1 tablet (25 mg total) by mouth daily.   mycophenolate 500 MG tablet Commonly known as: CellCept Take 2 tablets (1,000 mg total) by mouth daily. What changed:  how much to take when to take this   ondansetron 4 MG disintegrating tablet Commonly known as: ZOFRAN-ODT Take 4 mg by mouth every 8 (eight) hours as needed for vomiting or nausea.   pantoprazole 40 MG tablet Commonly known as: Protonix Take 1 tablet (40 mg total) by mouth daily. What changed: when to take this   polyethylene glycol 17 g packet Commonly known as: MIRALAX / GLYCOLAX Take 17 g by mouth 2 (two) times daily.   predniSONE 10 MG tablet Commonly known as: DELTASONE Take 20 mg by mouth daily with breakfast.   simethicone 80 MG chewable tablet Commonly known as: Gas-X Chew 1 tablet (80 mg total) by mouth 4 (four) times daily as needed for flatulence.   spironolactone 25 MG tablet Commonly known as: ALDACTONE TAKE 1 TABLET EVERY DAY   tadalafil (PAH) 20 MG tablet Commonly known as: ADCIRCA Take 2 tablets (40 mg total) by mouth daily. What changed: when to take this   [name] FlexTouch 100 UNIT/ML FlexTouch Pen Generic drug: insulin degludec Inject 9-14 Units into the skin at bedtime.   True Metrix Blood Glucose Test test strip Generic drug: glucose blood Use 1-2 times daily   True Metrix Blood Glucose Test test strip Generic drug: glucose blood SMARTSIG:Via Meter    True Metrix Meter w/Device Kit   Vitamin B12 1000 MCG Tbcr Take 1,000 mcg by mouth daily with lunch.   vitamin C 500 MG tablet Commonly known as: ASCORBIC ACID Take 500 mg by mouth daily with lunch.   Vitamin D3 50 MCG (2000 UT) capsule Take 2,000 Units by mouth daily with lunch.   warfarin 5 MG tablet Commonly known as: COUMADIN Take as directed. If you are unsure how to take this medication, talk to your nurse or doctor. Original instructions: TAKE 1 TO 1 AND 1/2 TABLETS DAILY AS DIRECTED BY COUMADIN CLINIC What changed: additional instructions               Durable Medical Equipment  (From admission, onward)           Start     Ordered   04/20/22 1158  For home use only DME 4 wheeled rolling walker with seat  Once  Question:  Patient needs a walker to treat with the following condition  Answer:  Weakness   04/20/22 1157            Allergies  Allergen Reactions   Flagyl [Metronidazole] Rash and Other (See Comments)    ALL-OVER BODY RASH   Ciprofloxacin Nausea Only and Other (See Comments)    Stomach ache   Coreg [Carvedilol] Other (See Comments)    Terrible cramping in the feet and had a lot of bowel movements, but not diarrhea   Diflucan [Fluconazole] Diarrhea   Losartan Swelling and Other (See Comments)    Patient doesn't recall site of swelling   Privigen [Immune Globulin (Human)] Other (See Comments)    Severe/excruciating pain   Sulfamethoxazole-Trimethoprim Nausea Only and Other (See Comments)    Stomach upset   Verapamil Hives   Zetia [Ezetimibe] Other (See Comments)    Reaction not recalled   Zocor [Simvastatin - High Dose] Other (See Comments)    Reaction not recalled   Gatifloxacin Rash and Other (See Comments)    Redness to skin around eye    Consultations:    Procedures/Studies: DG Foot 2 Views Right  Result Date: 04/29/2022 CLINICAL DATA:  Right foot swelling and pain noticed yesterday. Possibly from sandals. Hematoma on  dorsum of right foot. EXAM: RIGHT FOOT - 2 VIEW COMPARISON:  None Available. FINDINGS: There is moderate to high-grade focal soft tissue enlargement/swelling of the dorsal midfoot at the level of the metatarsal shafts corresponding to the clinically described hematoma. There is diffuse decreased bone mineralization. Mild-to-moderate joint space narrowing of the interphalangeal joints diffusely. Mild posterior tibiotalar joint space narrowing. No acute fracture is seen. No dislocation. Moderate vascular calcifications. IMPRESSION: 1. No acute fracture is seen. 2. There is moderate to high-grade focal soft tissue enlargement/swelling of the dorsal midfoot corresponding to the clinically described hematoma. Electronically Signed   By: Yvonne Kendall M.D.   On: 04/29/2022 15:08   VAS [place] UPPER EXTREMITY VENOUS DUPLEX  Result Date: 04/25/2022 UPPER VENOUS STUDY  Patient Name:  [name]  Date of Exam:   04/25/2022 Medical Rec #: [id number]       Accession #:    [id number] Date of Birth: March 15, 1941       Patient Gender: F Patient Age:   36 years Exam Location:  [facility] Procedure:      VAS [place] UPPER EXTREMITY VENOUS DUPLEX Referring Phys: [name] --------------------------------------------------------------------------------  Indications: Bruising of LUE (forearm) Risk Factors: Hemolytic anemia Per patient - recent lab draw and IV to LUE. Anticoagulation: Coumadin. Comparison Study: No previous exams Performing Technologist: Jody Hill RVT, RDMS  Examination Guidelines: A complete evaluation includes B-mode imaging, spectral Doppler, color Doppler, and power Doppler as needed of all accessible portions of each vessel. Bilateral testing is considered an integral part of a complete examination. Limited examinations for reoccurring indications may be performed as noted.  Right Findings: +----------+------------+---------+-----------+----------+--------------------+ RIGHT      CompressiblePhasicitySpontaneousProperties      Summary        +----------+------------+---------+-----------+----------+--------------------+ Subclavian               Yes       Yes                   patent by  color/doppler     +----------+------------+---------+-----------+----------+--------------------+  Left Findings: +----------+------------+---------+-----------+----------+-------+ LEFT      CompressiblePhasicitySpontaneousPropertiesSummary +----------+------------+---------+-----------+----------+-------+ IJV           Full       Yes       Yes                      +----------+------------+---------+-----------+----------+-------+ Subclavian    Full       Yes       Yes                      +----------+------------+---------+-----------+----------+-------+ Axillary      Full       Yes       Yes                      +----------+------------+---------+-----------+----------+-------+ Brachial      Full       Yes       Yes                      +----------+------------+---------+-----------+----------+-------+ Radial        Full                                          +----------+------------+---------+-----------+----------+-------+ Ulnar         Full                                          +----------+------------+---------+-----------+----------+-------+ Cephalic      Full                                          +----------+------------+---------+-----------+----------+-------+ Basilic       Full       Yes       Yes                      +----------+------------+---------+-----------+----------+-------+  Summary:  Right: No evidence of thrombosis in the subclavian.  Left: No evidence of deep vein thrombosis in the upper extremity. No evidence of superficial vein thrombosis in the upper extremity.  *See table(s) above for measurements and observations.  Diagnosing  physician: Deitra Mayo MD Electronically signed by Deitra Mayo MD on 04/25/2022 at 1:05:15 PM.    Final    CT Abdomen Pelvis Wo Contrast  Result Date: 04/18/2022 CLINICAL DATA:  Abdominal pain, vomiting, diarrhea, fever, rectal temp 101.2 on arrival EXAM: CT ABDOMEN AND PELVIS WITHOUT CONTRAST TECHNIQUE: Multidetector CT imaging of the abdomen and pelvis was performed following the standard protocol without IV contrast. RADIATION DOSE REDUCTION: This exam was performed according to the departmental dose-optimization program which includes automated exposure control, adjustment of the mA and/or kV according to patient size and/or use of iterative reconstruction technique. COMPARISON:  CT pelvis 04/14/2022, CT abdomen and pelvis 04/12/2022 FINDINGS: Lower chest: Peribronchial thickening with minimal bibasilar atelectasis. Large calcified granuloma at base of LEFT lower lobe. Enlargement of cardiac chambers. Hepatobiliary: Gallbladder surgically absent. Liver normal appearance Pancreas: Normal appearance Spleen: Calcified granulomata within spleen, which is otherwise normal. Adrenals/Urinary Tract: Adrenal glands normal appearance. Scattered renal vascular calcifications. No renal mass,  hydronephrosis, or hydroureter. No definite urinary tract calcification. Bladder decompressed by Foley catheter. Stomach/Bowel: Appendix not visualized. Diverticulosis of sigmoid colon. Wall thickening and pericolic inflammatory changes at the sigmoid colon over a moderate length, question diverticulitis versus colitis. No extraluminal gas or abscess. Diffuse stranding of pericolic fat extending into sigmoid mesocolon. Stomach and remaining bowel loops unremarkable. Vascular/Lymphatic: Atherosclerotic calcifications aorta, iliac arteries, visceral arteries, coronary arteries. Aorta normal caliber. Significant narrowing of common iliac arteries. No adenopathy. Reproductive: Uterus surgically absent. Ovaries not  definitely visualized. Other: Minimal free fluid in pelvis.  No free air.  No hernia. Musculoskeletal: Osseous demineralization. Degenerative changes lumbar spine and RIGHT hip joint. Superior endplate compression fracture L4 unchanged. IMPRESSION: Sigmoid diverticulosis with wall thickening and pericolic inflammatory changes at the sigmoid colon over a moderate length, question diverticulitis versus colitis. Extensive atherosclerotic disease changes with significant narrowing of common iliac arteries bilaterally. Superior endplate compression deformity of L4 unchanged. Aortic Atherosclerosis (ICD10-I70.0). Electronically Signed   By: Lavonia Dana M.D.   On: 04/18/2022 14:23   DG Chest Port 1 View  Result Date: 04/18/2022 CLINICAL DATA:  Questionable sepsis, vomiting, diarrhea, fever, abdominal pain, history hypertension EXAM: PORTABLE CHEST 1 VIEW COMPARISON:  Portable exam 1223 hours compared to 02/04/2022 FINDINGS: Enlargement of cardiac silhouette post CABG and AVR. Atherosclerotic calcification aorta. Pulmonary vascularity normal. Mild chronic bronchitic changes with calcified granuloma RIGHT mid lung. Linear scarring LEFT lower lobe. No acute infiltrate, pleural effusion, or pneumothorax. Bones demineralized with BILATERAL glenohumeral degenerative changes and scoliosis. IMPRESSION: Enlargement of cardiac silhouette post CABG and AVR. No acute abnormalities. Aortic Atherosclerosis (ICD10-I70.0). Electronically Signed   By: Lavonia Dana M.D.   On: 04/18/2022 13:01   CT PELVIS WO CONTRAST  Result Date: 04/14/2022 CLINICAL DATA:  Clinical concern for colovesical fistula. EXAM: CT PELVIS WITHOUT CONTRAST TECHNIQUE: Multidetector CT imaging of the pelvis was performed following the standard protocol without intravenous contrast. RADIATION DOSE REDUCTION: This exam was performed according to the departmental dose-optimization program which includes automated exposure control, adjustment of the mA and/or kV  according to patient size and/or use of iterative reconstruction technique. COMPARISON:  Abdomen pelvis CT 04/12/2022 FINDINGS: Urinary Tract: Foley catheter decompresses the urinary bladder. Gas in the bladder lumen is compatible with the presence of the instrumentation. Bowel: Advanced diverticular disease is seen in the sigmoid colon without diverticulitis. Rectum and sigmoid colon are well opacified with rectal contrast. No evidence for contrast in the vaginal lumen or bladder lumen to suggest colovaginal or colovesical fistula, respectively. Vascular/Lymphatic: No pelvic sidewall lymphadenopathy. Reproductive: The uterus is surgically absent. There is no adnexal mass. Other:  Trace free fluid noted right adnexal space. Musculoskeletal: No worrisome lytic or sclerotic osseous abnormality. Degenerative changes noted in both hips. IMPRESSION: 1. No evidence for colovaginal or colovesical fistula. 2. Sigmoid diverticulosis without diverticulitis. Electronically Signed   By: Misty Stanley M.D.   On: 04/14/2022 16:30   CT ABDOMEN PELVIS WO CONTRAST  Result Date: 04/12/2022 CLINICAL DATA:  A female at age 15 presents with acute abdominal pain. EXAM: CT ABDOMEN AND PELVIS WITHOUT CONTRAST TECHNIQUE: Multidetector CT imaging of the abdomen and pelvis was performed following the standard protocol without IV contrast. RADIATION DOSE REDUCTION: This exam was performed according to the departmental dose-optimization program which includes automated exposure control, adjustment of the mA and/or kV according to patient size and/or use of iterative reconstruction technique. COMPARISON:  Feb 20, 2022. FINDINGS: Lower chest: Calcified coronary artery disease. Signs of prior granulomatous disease. Hepatobiliary: Liver  with smooth contours. Post cholecystectomy. No gross biliary duct distension. Pancreas: No signs of pancreatic inflammation.  Pancreatic atrophy. Spleen: Granulomatous changes in the spleen. Adrenals/Urinary  Tract: Renal vascular calcifications. No perinephric stranding, no hydronephrosis or obstructing renal calculus. Normal adrenal glands. Gas throughout the urinary bladder at the periphery. Gas within the urinary bladder and about the urinary bladder is slightly increased from previous imaging. Density of material in the urinary bladder otherwise with simple fluid density. Towards the base of the urinary bladder is difficult to exclude the possibility of emphysematous cystitis based on the relationship of small gas locules of the wall of the urinary bladder. There is no substantial perivesical stranding. Stomach/Bowel: Small hiatal hernia. Small duodenal diverticulum. No acute small bowel process. The appendix is not visible though there are no secondary signs to suggest acute appendicitis. Colon is largely collapsed. There are signs of acute diverticulitis in the LEFT lower quadrant. Posterior diverticulum on image 48/2 with some mild surrounding stranding. Diverticulosis and diverticular disease throughout the sigmoid is moderate to marked and otherwise unchanged. Vascular/Lymphatic: Calcified aortic atherosclerosis. No aneurysmal dilation of the abdominal aorta. Signs of prior stenting of the LEFT common iliac artery. No adenopathy in either the abdomen or in the pelvis. Reproductive: Post hysterectomy without adnexal mass. Other: No free air or ascites. Musculoskeletal: Osteopenia. No acute bone finding or destructive bone process. Spinal degenerative changes. Redemonstration of compression fracture at L4 with similar appearance. IMPRESSION: 1. Emphysematous cystitis but without substantial surrounding stranding or bladder wall thickening. Consider correlation with urinalysis and urologic and or ID consultation for further management as this can be life-threatening. Close follow-up is suggested. 2. Acute diverticulitis of the sigmoid colon. 3. Gas in the urinary bladder is increased since previous imaging. 4.  Aortic atherosclerosis and coronary artery disease. 5. Similar appearance of L4 compression fracture. Aortic Atherosclerosis (ICD10-I70.0) and Emphysema (ICD10-J43.9). These results were called by telephone at the time of interpretation on 04/12/2022 at 1:14 pm to provider Davonna Belling , who verbally acknowledged these results. Electronically Signed   By: Zetta Bills M.D.   On: 04/12/2022 13:19      Subjective: Continues to have some pain in her right foot, though she feels this is improving.  She had a bowel movement yesterday and she is feeling better now.  Discharge Exam: Vitals:   04/30/22 1659 04/30/22 2019 05/01/22 0359 05/01/22 1318  BP: (!) 146/47 (!) 148/65 (!) 155/67 (!) 146/86  Pulse: 78 69 67 64  Resp: (!) _0 Temp: 97.6 F (36.4 C) 97.7 F (36.5 C) 97.8 F (36.6 C) 97.9 F (36.6 C)  TempSrc: Oral   Oral  SpO2: 99% 99% 98% 98%  Weight:      Height:        General: Pt is alert, awake, not in acute distress Cardiovascular: RRR, S1/S2 +, no rubs, no gallops Respiratory: CTA bilaterally, no wheezing, no rhonchi Abdominal: Soft, NT, ND, bowel sounds + Extremities: Right foot with hematoma on dorsal aspect    The results of significant diagnostics from this hospitalization (including imaging, microbiology, ancillary and laboratory) are listed below for reference.     Microbiology: No results found for this or any previous visit (from the past 240 hour(s)).   Labs: BNP (last 3 results) Recent Labs    02/02/22 1030 02/04/22 1730 04/12/22 1716  BNP 133.8* 253.4* 60.6   Basic Metabolic Panel: Recent Labs  Lab 04/25/22 0547 04/26/22 0545 04/27/22 0546 04/28/22 0431 04/29/22 0612 04/30/22  0537  NA 132* 127* 132* 132* 132* 134*  K 4.6 4.4 4.6 4.5 4.4 4.4  CL 103 96* 102 101 102 101  CO2 21* 21* 21* _0 GLUCOSE 188* 279* 281* 256* 227* 210*  BUN 84* 92* 93* 96* 98* 97*  CREATININE 2.12* 2.10* 2.18* 2.07* 2.11* 1.97*  CALCIUM 8.5* 7.9*  8.3* 8.5* 8.7* 8.6*  MG 1.7 1.8 1.8  --   --   --    Liver Function Tests: Recent Labs  Lab 04/28/22 0431  AST 44*  ALT 40  ALKPHOS 63  BILITOT 0.6  PROT 5.2*  ALBUMIN 3.1*   No results for input(s): "LIPASE", "AMYLASE" in the last 168 hours. No results for input(s): "AMMONIA" in the last 168 hours. CBC: Recent Labs  Lab 04/27/22 0546 04/28/22 0431 04/29/22 0612 04/30/22 0537 05/01/22 0502  WBC 3.8* 4.5 5.2 5.1 4.8  NEUTROABS 2.8 3.5 4.0 3.9 3.7  HGB 8.6* 8.1* 8.4* 8.4* 8.5*  HCT 26.3* 24.8* 25.4* 26.1* 26.2*  MCV 92.0 92.2 92.0 92.6 93.6  PLT 107* 105* 115* 125* 122*   Cardiac Enzymes: No results for input(s): "CKTOTAL", "CKMB", "CKMBINDEX", "TROPONINI" in the last 168 hours. BNP: Invalid input(s): "POCBNP" CBG: Recent Labs  Lab 04/30/22 1656 04/30/22 2015 05/01/22 0750 05/01/22 1141 05/01/22 1707  GLUCAP 311* 311* 150* 191* 296*   D-Dimer No results for input(s): "DDIMER" in the last 72 hours. Hgb A1c No results for input(s): "HGBA1C" in the last 72 hours. Lipid Profile No results for input(s): "CHOL", "HDL", "LDLCALC", "TRIG", "CHOLHDL", "LDLDIRECT" in the last 72 hours. Thyroid function studies No results for input(s): "TSH", "T4TOTAL", "T3FREE", "THYROIDAB" in the last 72 hours.  Invalid input(s): "FREET3" Anemia work up No results for input(s): "VITAMINB12", "FOLATE", "FERRITIN", "TIBC", "IRON", "RETICCTPCT" in the last 72 hours. Urinalysis    Component Value Date/Time   COLORURINE YELLOW 04/18/2022 1229   APPEARANCEUR HAZY (A) 04/18/2022 1229   LABSPEC 1.014 04/18/2022 1229   PHURINE 5.0 04/18/2022 1229   GLUCOSEU NEGATIVE 04/18/2022 1229   HGBUR SMALL (A) 04/18/2022 1229   BILIRUBINUR NEGATIVE 04/18/2022 1229   BILIRUBINUR Negative 09/07/2021 1054   KETONESUR NEGATIVE 04/18/2022 1229   PROTEINUR 100 (A) 04/18/2022 1229   UROBILINOGEN 0.2 09/07/2021 1054   UROBILINOGEN 1.0 10/06/2011 1709   NITRITE NEGATIVE 04/18/2022 1229   LEUKOCYTESUR  SMALL (A) 04/18/2022 1229   Sepsis Labs Recent Labs  Lab 04/28/22 0431 04/29/22 0612 04/30/22 0537 05/01/22 0502  WBC 4.5 5.2 5.1 4.8   Microbiology No results found for this or any previous visit (from the past 240 hour(s)).   Time coordinating discharge: 41mns  SIGNED:   JKathie Dike MD  [facility] 05/01/2022, 8:45 PM   If 7PM-7AM, please contact night-coverage [email or web address]

## 2022-05-01 NOTE — Progress Notes (Signed)
Physical Therapy Treatment Patient Details Name: [name] MRN: [id number] DOB: 1940-10-15 Today's Date: 05/01/2022   History of Present Illness [name] is an 81 y.o. 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 CT imaging from May suggested the presence of a colovesical fistula, CT completed during hospital stay indicate no signs of diverticulitis or fistula with rectal contrast.    PT Comments    Pt is making good progress with mobility, she ambulated 200' with RW without loss of balance.  From a PT standpoint, she is ready to DC home.   Recommendations for follow up therapy are one component of a multi-disciplinary discharge planning process, led by the attending physician.  Recommendations may be updated based on patient status, additional functional criteria and insurance authorization.  Follow Up Recommendations  Home health PT     Assistance Recommended at Discharge Set up Supervision/Assistance  Patient can return home with the following Assist for transportation;Assistance with cooking/housework;Help with stairs or ramp for entrance   Equipment Recommendations  Rollator (4 wheels)    Recommendations for Other Services       Precautions / Restrictions Precautions Precautions: Fall Restrictions Weight Bearing Restrictions: No     Mobility  Bed Mobility Overal bed mobility: Modified Independent Bed Mobility: Supine to Sit     Supine to sit: Modified independent (Device/Increase time)     General bed mobility comments: HOB up, use of rail    Transfers Overall transfer level: Needs assistance Equipment used: Rolling walker (2 wheels) Transfers: Sit to/from Stand Sit to Stand: Supervision           General transfer comment: VCs hand placement     Ambulation/Gait Ambulation/Gait assistance: Supervision Gait Distance (Feet): 200 Feet Assistive device: Rolling walker (2 wheels) Gait Pattern/deviations: Step-through pattern, Decreased stride length Gait velocity: decr     General Gait Details: steady with RW, no loss of balance   Stairs             Wheelchair Mobility    Modified Rankin (Stroke Patients Only)       Balance Overall balance assessment: Needs assistance Sitting-balance support: Feet supported Sitting balance-Leahy Scale: Good     Standing balance support: Reliant on assistive device for balance, During functional activity, Bilateral upper extremity supported Standing balance-Leahy Scale: Fair                              Cognition Arousal/Alertness: Awake/alert Behavior During Therapy: WFL for tasks assessed/performed Overall Cognitive Status: Within Functional Limits for tasks assessed                                          Exercises      General Comments        Pertinent Vitals/Pain Pain Assessment Faces Pain Scale: Hurts even more Pain Location: dorsum of R foot Pain Descriptors / Indicators: Grimacing, Guarding Pain Intervention(s): Limited activity within patient's tolerance, Monitored during session, Repositioned (pt declined pain medication)    Home Living                          Prior Function  PT Goals (current goals can now be found in the care plan section) Acute Rehab PT Goals Patient Stated Goal: get home PT Goal Formulation: With patient Time For Goal Achievement: 05/04/22 Potential to Achieve Goals: Good Progress towards PT goals: Progressing toward goals    Frequency    Min 3X/week      PT Plan Current plan remains appropriate    Co-evaluation              AM-PAC PT "6 Clicks" Mobility   Outcome Measure  Help needed turning from your back to your side while in a flat bed without using  bedrails?: None Help needed moving from lying on your back to sitting on the side of a flat bed without using bedrails?: A Little Help needed moving to and from a bed to a chair (including a wheelchair)?: None Help needed standing up from a chair using your arms (e.g., wheelchair or bedside chair)?: None Help needed to walk in hospital room?: None Help needed climbing 3-5 steps with a railing? : A Little 6 Click Score: 22    End of Session Equipment Utilized During Treatment: Gait belt Activity Tolerance: Patient tolerated treatment well Patient left: with call bell/phone within reach;in chair Nurse Communication: Mobility status PT Visit Diagnosis: Unsteadiness on feet (R26.81);Difficulty in walking, not elsewhere classified (R26.2);Muscle weakness (generalized) (M62.81)     Time: 8295-6213 PT Time Calculation (min) (ACUTE ONLY): 23 min  Charges:  $Gait Training: 8-22 mins $Therapeutic Activity: 8-22 mins                     [name] PT 05/01/2022  Acute Rehabilitation Services  Office [phone]

## 2022-05-02 ENCOUNTER — Other Ambulatory Visit: Payer: Self-pay

## 2022-05-02 LAB — CBC WITH DIFFERENTIAL/PLATELET
Abs Immature Granulocytes: 0.08 10*3/uL — ABNORMAL HIGH (ref 0.00–0.07)
Basophils Absolute: 0 10*3/uL (ref 0.0–0.1)
Basophils Relative: 0 %
Eosinophils Absolute: 0 10*3/uL (ref 0.0–0.5)
Eosinophils Relative: 1 %
HCT: 26.1 % — ABNORMAL LOW (ref 36.0–46.0)
Hemoglobin: 8.6 g/dL — ABNORMAL LOW (ref 12.0–15.0)
Immature Granulocytes: 2 %
Lymphocytes Relative: 15 %
Lymphs Abs: 0.7 10*3/uL (ref 0.7–4.0)
MCH: 30.6 pg (ref 26.0–34.0)
MCHC: 33 g/dL (ref 30.0–36.0)
MCV: 92.9 fL (ref 80.0–100.0)
Monocytes Absolute: 0.4 10*3/uL (ref 0.1–1.0)
Monocytes Relative: 8 %
Neutro Abs: 3.4 10*3/uL (ref 1.7–7.7)
Neutrophils Relative %: 74 %
Platelets: 126 10*3/uL — ABNORMAL LOW (ref 150–400)
RBC: 2.81 MIL/uL — ABNORMAL LOW (ref 3.87–5.11)
RDW: 17 % — ABNORMAL HIGH (ref 11.5–15.5)
WBC: 4.5 10*3/uL (ref 4.0–10.5)
nRBC: 0 % (ref 0.0–0.2)

## 2022-05-02 LAB — PROTIME-INR
INR: 2.6 — ABNORMAL HIGH (ref 0.8–1.2)
Prothrombin Time: 27.5 seconds — ABNORMAL HIGH (ref 11.4–15.2)

## 2022-05-02 LAB — GLUCOSE, CAPILLARY
Glucose-Capillary: 239 mg/dL — ABNORMAL HIGH (ref 70–99)
Glucose-Capillary: 192 mg/dL — ABNORMAL HIGH (ref 70–99)

## 2022-05-02 MED ORDER — WARFARIN SODIUM 5 MG PO TABS: 5.0000 mg | ORAL_TABLET | Freq: Once | ORAL | Status: DC

## 2022-05-02 MED ORDER — MAGIC MOUTHWASH W/LIDOCAINE
15.0000 mL | Freq: Three times a day (TID) | ORAL | Status: DC | PRN
  Filled 2022-05-02: qty 15

## 2022-05-02 NOTE — Progress Notes (Signed)
Patient seen and examined.  She ambulated the halls earlier today.  She is tolerating her diet.  She had another bowel movement today.  She reports wearing postop shoe and is able to ambulate with this.  Her exam is otherwise unchanged.  Vitals are stable.  Hemoglobin and INR are stable today when compared to yesterday.  Plan is to discharge home today with family.  Home health has been arranged.  No change in condition from discharge summary done yesterday.  No change in discharge medications.  Please refer to discharge summary done yesterday for further plan of care.  She is otherwise stable for discharge today.  [employer]

## 2022-05-02 NOTE — Patient Outreach (Signed)
[facility]) Care Management  05/02/2022  [name] 15-Jul-1941 [id number]   Spoke with son [name]. He states that patient discharged from the hospital today and that they want to cancel appointment for tomorrow as patient is staying with his sister [name] right now.  Advised that I would note that and follow up as appropriate.  [name], RN, MSN [facility] Care Management Care Management Coordinator Direct Line [phone] Toll Free: [phone]  Fax: [phone]

## 2022-05-02 NOTE — Progress Notes (Signed)
ANTICOAGULATION CONSULT NOTE - Follow Up Consult  Pharmacy Consult for Warfarin Indication:  afib + mech AVR  Allergies  Allergen Reactions   Flagyl [Metronidazole] Rash and Other (See Comments)    ALL-OVER BODY RASH   Ciprofloxacin Nausea Only and Other (See Comments)    Stomach ache   Coreg [Carvedilol] Other (See Comments)    Terrible cramping in the feet and had a lot of bowel movements, but not diarrhea   Diflucan [Fluconazole] Diarrhea   Losartan Swelling and Other (See Comments)    Patient doesn't recall site of swelling   Privigen [Immune Globulin (Human)] Other (See Comments)    Severe/excruciating pain   Sulfamethoxazole-Trimethoprim Nausea Only and Other (See Comments)    Stomach upset   Verapamil Hives   Zetia [Ezetimibe] Other (See Comments)    Reaction not recalled   Zocor [Simvastatin - High Dose] Other (See Comments)    Reaction not recalled   Gatifloxacin Rash and Other (See Comments)    Redness to skin around eye    Patient Measurements: Height: '4\' 11"'$  (149.9 cm) Weight: 59.4 kg (131 lb) IBW/kg (Calculated) : 43.2  Vital Signs: Temp: 98.7 F (37.1 C) (07/25 0515) Temp Source: Oral (07/24 2150) BP: 154/71 (07/25 0515) Pulse Rate: 72 (07/25 0515)  Labs: Recent Labs    04/30/22 0537 05/01/22 0502 05/02/22 0541  HGB 8.4* 8.5* 8.6*  HCT 26.1* 26.2* 26.1*  PLT 125* 122* 126*  LABPROT 25.4* 27.8* 27.5*  INR 2.3* 2.6* 2.6*  CREATININE 1.97*  --   --      Estimated Creatinine Clearance: 17 mL/min (A) (by C-G formula based on SCr of 1.97 mg/dL (H)).   Assessment: 16 YOWF admitted for persistent diverticulitis. w/ hx of PAF and mechanical AVR on warfarin PTA, CAD (CABG 2005), PAH, CHF, HTN, DM, vasculitis, and autoimmune hemolytic anemia. Baseline INR supratherapeutic. Intially INR bridged w/ heparin, switched to enoxaparin d/t limited IV access.  Pharmacy consulted to dose warfarin/enoxaparin.  - Warf dose PTA: '5mg'$  QD and 7.'5mg'$  W,Fr,Su   Today,  05/02/2022 - INR 2.6, therapeutic - CBC: Hgb low but stable (transfused 7/14), Plts 125 also low but stable. - Per MD note, seems to have a hematoma on dorsum of foot, may be related to sandals - No drug-drug interactions noted - Tolerating reg diet and supplements  Goal of Therapy:  INR 2-3 Monitor platelets by anticoagulation protocol: Yes   Plan:  - Warfarin '5mg'$  po x 1 today. - Likely can resume home regimen at discharge - Daily INR  Peggyann Juba, PharmD, [place]: [phone] 05/02/2022,7:12 AM

## 2022-05-02 NOTE — Progress Notes (Signed)
   05/02/22 1100  Mobility  Activity Ambulated with assistance in hallway  Level of Assistance Contact guard assist, steadying assist  Assistive Device Four wheel walker  Distance Ambulated (ft) 80 ft  Activity Response Tolerated well  Transport method Ambulatory  $Mobility charge 1 Mobility    Pt's hematoma on R foot caused occasional pain when walking, claimed sometimes it was a 10/10 then sometimes wouldn't hurt at all. During ambulation took 2 standing rest breaks due to fatigue. Pt was left in bed with all necessities in reach. Pt was using shoes with a post op shoe on R foot.   Roderick Pee  Mobility Specialist

## 2022-05-02 NOTE — Care Management Important Message (Signed)
Important Message  Patient Details IM Letter placed in Patients room. Name: [name] MRN: [id number] Date of Birth: 1941-03-06   Medicare Important Message Given:  Yes     Kerin Salen 05/02/2022, 12:09 PM

## 2022-05-03 ENCOUNTER — Telehealth: Payer: Self-pay | Admitting: *Deleted

## 2022-05-03 ENCOUNTER — Ambulatory Visit: Payer: Self-pay

## 2022-05-03 NOTE — Telephone Encounter (Addendum)
Pt called and stated that she was recently in the hospital. Pt wondering how to take her warfarin.  Per chart pt was in the hospital from 7/11-7/25 for colitis. Pt stated that she is not having loose stools and is not on any abx.   Informed pt to continue her normal dose of warfarin '5mg'$  daily excpet for 7.'5mg'$  Sunday, Wednesday and Friday.   Pt stated that she usually gets INR checked at the cancer center. However, she does not have an appointment at this time. Informed pt to let us know when she has appointment scheduled at the cancer center because we would like for  her INR to be checked with in the week.   Wrote pt's name down in the coumadin clinic follow up book.

## 2022-05-05 ENCOUNTER — Inpatient Hospital Stay (HOSPITAL_BASED_OUTPATIENT_CLINIC_OR_DEPARTMENT_OTHER): Payer: Medicare HMO | Admitting: Hematology and Oncology

## 2022-05-05 ENCOUNTER — Encounter: Payer: Self-pay | Admitting: Hematology and Oncology

## 2022-05-05 ENCOUNTER — Inpatient Hospital Stay: Payer: Medicare HMO

## 2022-05-05 ENCOUNTER — Ambulatory Visit (INDEPENDENT_AMBULATORY_CARE_PROVIDER_SITE_OTHER): Payer: Medicare HMO

## 2022-05-05 ENCOUNTER — Other Ambulatory Visit: Payer: Self-pay

## 2022-05-05 VITALS — BP 152/69 | HR 68 | Temp 98.2°F | Resp 16 | Ht 59.0 in | Wt 133.7 lb

## 2022-05-05 DIAGNOSIS — I7782 Antineutrophilic cytoplasmic antibody (ANCA) vasculitis: Secondary | ICD-10-CM | POA: Diagnosis not present

## 2022-05-05 DIAGNOSIS — D594 Other nonautoimmune hemolytic anemias: Secondary | ICD-10-CM

## 2022-05-05 DIAGNOSIS — Z952 Presence of prosthetic heart valve: Secondary | ICD-10-CM

## 2022-05-05 DIAGNOSIS — D591 Autoimmune hemolytic anemia, unspecified: Secondary | ICD-10-CM

## 2022-05-05 DIAGNOSIS — Z7901 Long term (current) use of anticoagulants: Secondary | ICD-10-CM | POA: Diagnosis not present

## 2022-05-05 DIAGNOSIS — I5032 Chronic diastolic (congestive) heart failure: Secondary | ICD-10-CM | POA: Diagnosis not present

## 2022-05-05 DIAGNOSIS — N184 Chronic kidney disease, stage 4 (severe): Secondary | ICD-10-CM | POA: Diagnosis not present

## 2022-05-05 DIAGNOSIS — L03115 Cellulitis of right lower limb: Secondary | ICD-10-CM

## 2022-05-05 DIAGNOSIS — M8580 Other specified disorders of bone density and structure, unspecified site: Secondary | ICD-10-CM | POA: Diagnosis not present

## 2022-05-05 LAB — CBC WITH DIFFERENTIAL/PLATELET
Abs Immature Granulocytes: 0.04 10*3/uL (ref 0.00–0.07)
Basophils Absolute: 0 10*3/uL (ref 0.0–0.1)
Basophils Relative: 0 %
Eosinophils Absolute: 0.1 10*3/uL (ref 0.0–0.5)
Eosinophils Relative: 1 %
HCT: 27.7 % — ABNORMAL LOW (ref 36.0–46.0)
Hemoglobin: 9.2 g/dL — ABNORMAL LOW (ref 12.0–15.0)
Immature Granulocytes: 1 %
Lymphocytes Relative: 4 %
Lymphs Abs: 0.3 10*3/uL — ABNORMAL LOW (ref 0.7–4.0)
MCH: 30.4 pg (ref 26.0–34.0)
MCHC: 33.2 g/dL (ref 30.0–36.0)
MCV: 91.4 fL (ref 80.0–100.0)
Monocytes Absolute: 0.3 10*3/uL (ref 0.1–1.0)
Monocytes Relative: 4 %
Neutro Abs: 7.2 10*3/uL (ref 1.7–7.7)
Neutrophils Relative %: 90 %
Platelets: 125 10*3/uL — ABNORMAL LOW (ref 150–400)
RBC: 3.03 MIL/uL — ABNORMAL LOW (ref 3.87–5.11)
RDW: 17.6 % — ABNORMAL HIGH (ref 11.5–15.5)
WBC: 7.9 10*3/uL (ref 4.0–10.5)
nRBC: 0 % (ref 0.0–0.2)

## 2022-05-05 LAB — CMP (CANCER CENTER ONLY)
ALT: 30 U/L (ref 0–44)
AST: 30 U/L (ref 15–41)
Albumin: 3.6 g/dL (ref 3.5–5.0)
Alkaline Phosphatase: 78 U/L (ref 38–126)
Anion gap: 8 (ref 5–15)
BUN: 114 mg/dL — ABNORMAL HIGH (ref 8–23)
CO2: 20 mmol/L — ABNORMAL LOW (ref 22–32)
Calcium: 8.6 mg/dL — ABNORMAL LOW (ref 8.9–10.3)
Chloride: 104 mmol/L (ref 98–111)
Creatinine: 2.34 mg/dL — ABNORMAL HIGH (ref 0.44–1.00)
GFR, Estimated: 20 mL/min — ABNORMAL LOW (ref >60)
Glucose, Bld: 190 mg/dL — ABNORMAL HIGH (ref 70–99)
Potassium: 5 mmol/L (ref 3.5–5.1)
Sodium: 132 mmol/L — ABNORMAL LOW (ref 135–145)
Total Bilirubin: 0.5 mg/dL (ref 0.3–1.2)
Total Protein: 5.6 g/dL — ABNORMAL LOW (ref 6.5–8.1)

## 2022-05-05 LAB — RETIC PANEL
Immature Retic Fract: 5.4 % (ref 2.3–15.9)
RBC.: 2.96 MIL/uL — ABNORMAL LOW (ref 3.87–5.11)
Retic Count, Absolute: 138.8 10*3/uL (ref 19.0–186.0)
Retic Ct Pct: 4.7 % — ABNORMAL HIGH (ref 0.4–3.1)
Reticulocyte Hemoglobin: 35.5 pg (ref >27.9)

## 2022-05-05 LAB — SAMPLE TO BLOOD BANK

## 2022-05-05 LAB — PROTIME-INR
INR: 3.1 — ABNORMAL HIGH (ref 0.8–1.2)
Prothrombin Time: 31.9 seconds — ABNORMAL HIGH (ref 11.4–15.2)

## 2022-05-05 MED ORDER — CEPHALEXIN 500 MG PO CAPS: 500.0000 mg | ORAL_CAPSULE | Freq: Three times a day (TID) | ORAL | 0 refills | Status: DC

## 2022-05-05 NOTE — Patient Instructions (Addendum)
Description   Called and spoke with pt's son and pt over speaker phone. Advised to only take 0.5 tablet today and then continue taking 1 tablet daily except 1.5 tablets on Sundays, Wednesdays, and Fridays. Recheck IN[facility], 05/12/22, [facility] show up in Epic, done weekly.  Call with Prednisone dosage changes ('20mg'$  daily starting 7/5). Please keep green leafy veggies and boost in your diet. Call with any new meds [phone] & if you have any procedures where you need to hold warfarin, have them fax a clearance form or labs to [phone].

## 2022-05-05 NOTE — Progress Notes (Signed)
[facility] Follow up:    [name], [name], MD [place] [address]  DIAGNOSIS: Autoimmune hemolytic anemia  SUMMARY OF HEMATOLOGIC HISTORY:  AZALEA CEDAR is a 81 y.o. [place] woman with multifactorial anemia, as follows:             (a) anemia of chronic inflammation: As of May 2020 she has a SED rate of 95, CRP 1.1, positive ANA and RF; subsequently she was found to be ANCA positive             (b) hemolysis (mild): she has a mildly elevated LDH and a DAT positive for complement, not IgG; this is c/w (a) above             (c) anemia of renal insufficiency: inadequate EPO resonse to anemia                         (d) GIB/ iron deficiency in patient on lifelong anticoagulaton   (1) Feraheme received 03/01/2019, repeated 03/20/2019              (a) reticulocyte 03/07/2019 up from 43.4 a year ago to 191.8 after iron infusion             (b) feraheme repeated on 05/12/2019             (c) subsequent ferritin levels >100   (2) darbepoetin: starting 05/05/2019 at 175mg given every 2 weeks             (a) dose increased to 200 mcg every week starting 07/01/2019              (b) back to every 2 weeks beginning 08/04/2019 still at 200 mcg dose             (c) switched to Retacrit every 4 weeks as of 08/18/2019   (3) ANCA positive vasculitis: diagnosed 05/08/2019 (titer 1:640)             (a) prednisone 40 mg daily started 05/31/2019             (b) rituximab weekly started 06/12/2019 (received test dose 06/05/2019)                         (i) hepatitis B sAg, core Ab and HIV negative 05/08/2019                         (ii) rituximab held after 07/14/2019 dose (received 5 weekly doses) with neutropenia                         (iii) rituximab resumed 08/18/2019 but changed to monthly                         (iv) rituximab changed to every 2 months beginning with 02/02/2020 dose                         (v) rituximab changed to every 12 weeks after  the 10/12/2020 dose             (c) prednisone tapered to off as of 09/15/2019   (4) osteopenia with a T score of -1.5 on bone density 02/28/2016 at the [facility] center  CURRENT THERAPY: Prednisone and MMF  INTERVAL HISTORY:  DDallana Mavity  [place] 81 y.o. female returns for her follow-up of anemia. She is here with her son to the appointment.  Since last visit, she was admitted for UTI and diverticulitis and was in the hospital for almost 3 weeks.  She did not need any blood transfusion during this last hospitalization.  She was however continued on prednisone 20 mg and mycophenolate was held during the hospitalization.  She continues to have a Foley bag, has a follow-up with urology to have this removed.  She also has ongoing lower extremity swelling which significantly improved while she was in the hospital on IV Lasix.  She has now noticed some redness in the legs since yesterday.  No fevers or chills.  She once again and struggles with hyperglycemia and this is being monitored by Dr. Osborne Casco.    Rest of the pertinent 10 point ROS reviewed and negative.   Patient Active Problem List   Diagnosis Date Noted   C. difficile colitis 04/19/2022   Colitis 04/18/2022   PAH (pulmonary artery hypertension) ([place]) 04/18/2022   Hypokalemia 04/15/2022   Colovesical fistula, ruled out 04/14/2022   Emphysematous cystitis 04/13/2022   Female bladder prolapse 04/13/2022   Hyperkalemia 02/04/2022   Acute renal failure superimposed on stage 3b chronic kidney disease ([place]) 02/04/2022   Hyperglycemia 01/09/2022   Autoimmune hemolytic anemia ([place]) 01/02/2022   Bilateral lower extremity edema 01/02/2022   Refractory anemia ([place]) 12/15/2021   Abnormal chest x-ray 12/15/2021   Subtherapeutic international normalized ratio (INR) 12/15/2021   Elevated troponin 12/15/2021   Unilateral primary osteoarthritis, right hip 05/31/2021   Midline cystocele 05/30/2021   Primary localized osteoarthritis of pelvic region and  thigh 05/30/2021   Vaginal pain 05/30/2021   Asymmetric SNHL (sensorineural hearing loss) 01/06/2021   Referred otalgia of both ears 01/06/2021   Bilateral hearing loss 08/31/2020   Low back pain 02/20/2020   CKD (chronic kidney disease), stage IV ([place]) 10/13/2019   Leukopenia due to antineoplastic chemotherapy ([place]) 08/18/2019   Thrombophilia ([place]) 06/19/2019   Encounter for general adult medical examination without abnormal findings 06/13/2019   Arteritis ([place]) 06/06/2019   Hypertensive heart and chronic kidney disease with heart failure and stage 1 through stage 4 chronic kidney disease, or unspecified chronic kidney disease ([place]) 06/06/2019   Muscle weakness 06/06/2019   Pancytopenia ([place]) 05/28/2019   Dyspnea 05/28/2019   Vasculitis, ANCA positive 05/28/2019   Disorder of connective tissue ([place]) 05/14/2019   Renal insufficiency 05/08/2019   Acute on chronic diastolic CHF (congestive heart failure) ([place]) 04/07/2019   Left leg cellulitis 04/07/2019   PAF (paroxysmal atrial fibrillation) ([place]) 04/07/2019   Iron deficiency anemia 02/28/2019   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 ([place]) 02/28/2019   Diabetic retinopathy associated with type 2 diabetes mellitus ([place]) 09/02/2018   Chronic pain 06/19/2018   Non-thrombocytopenic purpura ([place]) 05/21/2018   Gout 03/22/2018   Malnutrition of moderate degree 03/12/2018   Diabetes mellitus type 2, controlled ([place]) 03/06/2018   S/P cholecystectomy 03/06/2018   H/O mechanical aortic valve replacement 03/06/2018   Moderate to severe pulmonary hypertension ([place]) 03/06/2018   GERD (gastroesophageal reflux disease) 02/21/2018   Anxiety 02/21/2018   Chronic diastolic CHF (congestive heart failure) ([place]) 02/21/2018   Diarrhea 02/21/2018   General weakness 02/02/2018   Thrombocytopenia ([place]) 01/25/2018   Arm pain, diffuse 01/25/2018   Anemia 01/23/2018   Aortic atherosclerosis ([place]) 01/23/2018   GI bleed 01/18/2018    Age-related osteoporosis without current pathological fracture 05/24/2017   Carotid artery occlusion  05/24/2017   Generalized anxiety disorder 05/24/2017   Hearing loss 05/24/2017   Presence of prosthetic heart valve 05/24/2017   Carotid artery disease ([place]) 08/18/2013   Peripheral arterial disease ([place]) 08/18/2013   Diabetes mellitus without complication ([place]) 61/95/0932   Hyponatremia 10/06/2011   Hypertension    Hypercholesteremia    Mechanical heart valve present    Chronic anticoagulation     is allergic to flagyl [metronidazole], ciprofloxacin, coreg [carvedilol], diflucan [fluconazole], losartan, privigen [immune globulin (human)], sulfamethoxazole-trimethoprim, verapamil, zetia [ezetimibe], zocor [simvastatin - high dose], and gatifloxacin.  MEDICAL HISTORY: Past Medical History:  Diagnosis Date   Aortic atherosclerosis ([place]) 01/23/2018   Atrial fibrillation (HCC)    Benign positional vertigo 10/06/2011   Carotid artery disease ([place])    Chemotherapy induced neutropenia ([place]) 07/21/2019   Cholelithiasis 01/23/2018   Cholelithiasis 01/23/2018   Chronic anticoagulation    Coronary artery disease    status post coronary artery bypass grafting times 07/10/2004   Diabetes mellitus    Hypercholesteremia    Hypertension    Mechanical heart valve present    H. aortic valve replacement at the time of bypass surgery October 2005   Moderate to severe pulmonary hypertension ([place])    Peripheral arterial disease ([place])    history of left common iliac artery PTA and stenting for a chronic total occlusion 08/26/01    SURGICAL HISTORY: Past Surgical History:  Procedure Laterality Date   anterior repair  2009   AORTIC VALVE REPLACEMENT  2005   Dr. Cyndia Bent   CARDIAC CATHETERIZATION  11/10/2004   40% right common illiac, 70% in stent restenosis of distal left common illiac,    CARDIAC CATHETERIZATION  05/18/2004   LAD 50-70% midstenosis, RCA dominant w/50% stenosis, 50% Right common  Illiac artery ostial stenosis, 90% in stent restenosis within midportion of left common illiac stent   Carotid Duplex  03/12/2012   RSA-elev. velocities suggestive of a 50-69% diameter reduction, Right&Left Bulb/Prox ICA-mild-mod.fibrous plaqueelevating Velocities abnormal study.   CHOLECYSTECTOMY N/A 03/01/2018   Procedure: LAPAROSCOPIC CHOLECYSTECTOMY;  Surgeon: Judeth Horn, MD;  Location: [facility];  Service: General;  Laterality: N/A;   COLONOSCOPY WITH PROPOFOL N/A 01/22/2018   Procedure: COLONOSCOPY WITH PROPOFOL;  Surgeon: Wilford Corner, MD;  Location: [facility];  Service: Endoscopy;  Laterality: N/A;   ESOPHAGOGASTRODUODENOSCOPY (EGD) WITH PROPOFOL N/A 01/22/2018   Procedure: ESOPHAGOGASTRODUODENOSCOPY (EGD) WITH PROPOFOL;  Surgeon: Wilford Corner, MD;  Location: [facility];  Service: Endoscopy;  Laterality: N/A;   ESOPHAGOGASTRODUODENOSCOPY (EGD) WITH PROPOFOL N/A 11/21/2018   Procedure: ESOPHAGOGASTRODUODENOSCOPY (EGD) WITH PROPOFOL;  Surgeon: [name], MD;  Location: [facility];  Service: Gastroenterology;  Laterality: N/A;   ESOPHAGOGASTRODUODENOSCOPY (EGD) WITH PROPOFOL Left 02/27/2019   Procedure: ESOPHAGOGASTRODUODENOSCOPY (EGD) WITH PROPOFOL;  Surgeon: Arta Silence, MD;  Location: [facility];  Service: Endoscopy;  Laterality: Left;   GIVENS CAPSULE STUDY N/A 11/21/2018   Procedure: GIVENS CAPSULE STUDY;  Surgeon: [name], MD;  Location: [facility];  Service: Gastroenterology;  Laterality: N/A;  To be deployed during EGD   Lower Ext. Duplex  03/12/2012   Right Proximal CIA- vessel narrowing w/elevated velocities 0-49% diameter reduction. Right SFA-mild mixed density plaque throughout vessel.   NM MYOCAR PERF WALL MOTION  05/19/2010   protocol: Persantine, post stress EF 65%, negative for ischemia, low risk scan   RIGHT HEART CATH N/A 06/27/2018   Procedure: RIGHT HEART CATH;  Surgeon: [name], MD;  Location: [facility];  Service: Cardiovascular;   Laterality: N/A;  TOTAL ABDOMINAL HYSTERECTOMY W/ BILATERAL SALPINGOOPHORECTOMY  1989   TRANSTHORACIC ECHOCARDIOGRAM  08/29/2012   Moderately calcified annulus of mitral valve, moderate regurg. of both mitral valve and tricuspid valve.     SOCIAL HISTORY: Social History   Socioeconomic History   Marital status: Widowed    Spouse name: Not on file   Number of children: Not on file   Years of education: Not on file   Highest education level: Not on file  Occupational History   Occupation: Retired  Tobacco Use   Smoking status: Former    Packs/day: 1.00    Years: 30.00    Total pack years: 30.00    Types: Cigarettes   Smokeless tobacco: Never   Tobacco comments:    quit smoking 2005  Vaping Use   Vaping Use: Never used  Substance and Sexual Activity   Alcohol use: No    Alcohol/week: 0.0 standard drinks of alcohol   Drug use: No   Sexual activity: Not Currently    Birth control/protection: None  Other Topics Concern   Not on file  Social History Narrative   Not on file   Social Determinants of Health   Financial Resource Strain: Low Risk  (02/28/2022)   Overall Financial Resource Strain (CARDIA)    Difficulty of Paying Living Expenses: Not hard at all  Food Insecurity: No Food Insecurity (01/13/2022)   Hunger Vital Sign    Worried About Running Out of Food in the Last Year: Never true    [place] in the Last Year: Never true  Transportation Needs: No Transportation Needs (07/13/2021)   PRAPARE - Hydrologist (Medical): No    Lack of Transportation (Non-Medical): No  Physical Activity: Inactive (02/28/2022)   Exercise Vital Sign    Days of Exercise per Week: 0 days    Minutes of Exercise per Session: 0 min  Stress: No Stress Concern Present (02/28/2022)   [place]    Feeling of Stress : Only a little  Social Connections: Socially Isolated (11/25/2019)   Social  Connection and Isolation Panel [NHANES]    Frequency of Communication with Friends and Family: More than three times a week    Frequency of Social Gatherings with Friends and Family: Once a week    Attends Religious Services: Never    Marine scientist or Organizations: No    Attends Archivist Meetings: Never    Marital Status: Widowed  Intimate Partner Violence: Not At Risk (02/28/2022)   Humiliation, Afraid, Rape, and Kick questionnaire    Fear of Current or Ex-Partner: No    Emotionally Abused: No    Physically Abused: No    Sexually Abused: No    FAMILY HISTORY: Family History  Problem Relation Age of Onset   Heart disease Father    Hypertension Father    Hyperlipidemia Father    Breast cancer Neg Hx     Review of Systems  Constitutional:  Positive for fatigue. Negative for appetite change, chills, fever and unexpected weight change.  HENT:   Negative for hearing loss, lump/mass and trouble swallowing.   Eyes:  Negative for eye problems and icterus.  Respiratory:  Positive for shortness of breath. Negative for chest tightness and cough.   Cardiovascular:  Positive for leg swelling. Negative for chest pain and palpitations.  Gastrointestinal:  Negative for abdominal distention, abdominal pain, constipation, diarrhea, nausea and vomiting.  Endocrine: Negative  for hot flashes.  Genitourinary:  Negative for difficulty urinating.   Musculoskeletal:  Negative for arthralgias.  Skin:  Positive for rash. Negative for itching.  Neurological:  Negative for dizziness, extremity weakness, headaches and numbness.  Hematological:  Negative for adenopathy. Does not bruise/bleed easily.  Psychiatric/Behavioral:  Negative for depression. The patient is not nervous/anxious.       PHYSICAL EXAMINATION  ECOG PERFORMANCE STATUS: 1 - Symptomatic but completely ambulatory  Vitals:   05/05/22 1153  BP: (!) 152/69  Pulse: 68  Resp: 16  Temp: 98.2 F (36.8 C)  SpO2: 100%     Physical Exam Constitutional:      General: She is not in acute distress.    Appearance: Normal appearance. She is not toxic-appearing.     Comments: She is in a wheelchair  HENT:     Head: Normocephalic and atraumatic.  Eyes:     General: No scleral icterus. Cardiovascular:     Rate and Rhythm: Normal rate and regular rhythm.     Pulses: Normal pulses.     Heart sounds: Normal heart sounds.  Pulmonary:     Effort: Pulmonary effort is normal.     Breath sounds: Normal breath sounds.  Abdominal:     General: Abdomen is flat. Bowel sounds are normal. There is no distension.     Palpations: Abdomen is soft.     Tenderness: There is no abdominal tenderness. There is no guarding.  Musculoskeletal:        General: Swelling (Bilateral lower extremity swelling with some cellulitis changes noted in the right lower extremity more than the left lower extremity.) present.     Cervical back: Neck supple.  Lymphadenopathy:     Cervical: No cervical adenopathy.  Skin:    General: Skin is warm and dry.     Findings: No rash.  Neurological:     General: No focal deficit present.     Mental Status: She is alert.  Psychiatric:        Mood and Affect: Mood normal.        Behavior: Behavior normal.     LABORATORY DATA:  CBC    Component Value Date/Time   WBC 7.9 05/05/2022 1122   RBC 3.03 (L) 05/05/2022 1122   RBC 2.96 (L) 05/05/2022 1122   HGB 9.2 (L) 05/05/2022 1122   HGB 10.2 (L) 03/24/2022 1008   HCT 27.7 (L) 05/05/2022 1122   HCT 28.5 (L) 11/20/2018 1824   PLT 125 (L) 05/05/2022 1122   PLT 122 (L) 03/24/2022 1008   MCV 91.4 05/05/2022 1122   MCH 30.4 05/05/2022 1122   MCHC 33.2 05/05/2022 1122   RDW 17.6 (H) 05/05/2022 1122   LYMPHSABS 0.3 (L) 05/05/2022 1122   MONOABS 0.3 05/05/2022 1122   EOSABS 0.1 05/05/2022 1122   BASOSABS 0.0 05/05/2022 1122    CMP     Component Value Date/Time   NA 134 (L) 04/30/2022 0537   NA 137 12/06/2017 1436   K 4.4 04/30/2022 0537    CL 101 04/30/2022 0537   CO2 22 04/30/2022 0537   GLUCOSE 210 (H) 04/30/2022 0537   BUN 97 (H) 04/30/2022 0537   BUN 20 12/06/2017 1436   CREATININE 1.97 (H) 04/30/2022 0537   CREATININE 2.55 (H) 04/10/2022 1310   CALCIUM 8.6 (L) 04/30/2022 0537   PROT 5.2 (L) 04/28/2022 0431   ALBUMIN 3.1 (L) 04/28/2022 0431   AST 44 (H) 04/28/2022 0431   AST 28 04/10/2022 1310  ALT 40 04/28/2022 0431   ALT 24 04/10/2022 1310   ALKPHOS 63 04/28/2022 0431   BILITOT 0.6 04/28/2022 0431   BILITOT 0.5 04/10/2022 1310   GFRNONAA 25 (L) 04/30/2022 0537   GFRNONAA 18 (L) 04/10/2022 1310   GFRAA 32 (L) 03/30/2020 0932     ASSESSMENT and THERAPY PLAN:  BRACHA FRANKOWSKI is a 81 y.o. female who presents for multifactorial anemia, previously treated with rituximab for ANCA positive hemolytic anemia, previously on maintenance rituximab every 12 weeks presented with worsening anemia thought to be related to her hemolysis.  She was diagnosed with elevated ANCA titers 1 is to 640 and previously treated for ANCA positive hemolytic anemia, seen by rheumatology Dr. Salome Holmes back in 2020, responded at that time to prednisone and rituximab and continued on rituximab maintenance.  However her rituximab was changed to every 52-monthinfusions and she relapsed before her second cycle of every 331-monthituximab.   # Multifactorial Anemia:  --Bone marrow biopsy from 02/20/2022 showed normocellular marrow with trilineage hematopoiesis. No leukemia, lymphoma or high-grade dysplasia identified. --CT CAP from 02/20/2022 did not show any underlying cause for anemia.  --ANCA titers are normal when checked on 02/10/2022 --Currently on prednisone 20 mg p.o. daily, mycophenolate was increased to 1000 mg however this was discontinued while she was hospitalized and she continued to maintain her hemoglobin while she is on prednisone. -- So far we have attempted prednisone taper 4 times and this was not successful.  Every time we dropped  her dose below 20 mg, she has worsening hemolysis and anemia requiring blood transfusion.  We have talked on multiple instances about the complications with steroids including hyperglycemia, lower extremity swelling and however since this seems to be the only effective and reasonably well-tolerated medication, we have agreed to continue it at the current dose.  We will need assistance from her nephrologist Dr. GoMoshe Ciprond Dr. TiOsborne Cascon managing her kidney numbers and lower extremity edema as well as her hyperglycemia.  [facility] also [facility] for any additional recommendations.  She did not appear to have responded to Rituxan in the past, had a reaction to IVIG which makes it a challenge impossible, did not respond to mycophenolate.  She also has several underlying comorbidities including CHF, CKD stage IV which limits several of our options. ----Consider PRBC transfusion if hemoglobin is less than adequately to 8 --No indication for transfusion today hemoglobin is 9.2 and reticulocyte count is 4.7%.  She will return to clinic weekly Patient expressed understanding of the plan provided. She is aware to reach out if she has any new questions or concerns.   I have spent a total of 40 minutes minutes of face-to-face and non-face-to-face time, preparing to see the patient, performing a medically appropriate examination, counseling and educating the patient, documenting clinical information in the electronic health record, and care coordination.   PrBenay PikeD

## 2022-05-08 ENCOUNTER — Other Ambulatory Visit: Payer: Self-pay

## 2022-05-08 NOTE — Patient Outreach (Signed)
[facility]) Care Management  05/08/2022  NAREH MATZKE 1941/02/17 [id number]   Telephone call to patient for follow up. Female states patient is asleep.  Advised that CM would outreach another time but did give CM information.    Plan: RN CM will attempt again within 4 business days.    [name], RN, MSN [facility] Care Management Care Management Coordinator Direct Line [phone] Toll Free: [phone]  Fax: [phone]

## 2022-05-09 ENCOUNTER — Telehealth: Payer: Self-pay | Admitting: Hematology and Oncology

## 2022-05-09 NOTE — Telephone Encounter (Signed)
Rescheduled appointment per provider PAL. Talked with the patients son Thurmond Butts and he is aware of the changes made to the patients upcoming appointment.

## 2022-05-10 ENCOUNTER — Encounter: Payer: Self-pay | Admitting: Obstetrics and Gynecology

## 2022-05-10 ENCOUNTER — Ambulatory Visit (INDEPENDENT_AMBULATORY_CARE_PROVIDER_SITE_OTHER): Payer: Medicare HMO | Admitting: Obstetrics and Gynecology

## 2022-05-10 VITALS — BP 165/53 | HR 78

## 2022-05-10 DIAGNOSIS — N812 Incomplete uterovaginal prolapse: Secondary | ICD-10-CM

## 2022-05-10 DIAGNOSIS — N8110 Cystocele, unspecified: Secondary | ICD-10-CM | POA: Diagnosis not present

## 2022-05-10 NOTE — Progress Notes (Signed)
[place] [name] Return Visit  SUBJECTIVE  History of Present Illness: [name] is a 81 y.o. female seen in follow-up for prolapse and incontinence.  Had foley catheter placed in ED on 04/12/22. Concern for emphysematous cystitis on CT scan- showed bladder wall thickening and air.  Has follow [facility].   She reports that her prolapse is more bothersome. She has leakage all the time that just comes out. She is worried about emptying her bladder once the pessary comes out. We reviewed that a pessary would be her best non-surgical option and she agreed to placement.   Past Medical History: Patient  has a past medical history of Aortic atherosclerosis ([place]) (01/23/2018), Atrial fibrillation ([place]), Benign positional vertigo (10/06/2011), Carotid artery disease ([place]), Chemotherapy induced neutropenia ([place]) (07/21/2019), Cholelithiasis (01/23/2018), Cholelithiasis (01/23/2018), Chronic anticoagulation, Coronary artery disease, Diabetes mellitus, Hypercholesteremia, Hypertension, Mechanical heart valve present, Moderate to severe pulmonary hypertension ([place]), and Peripheral arterial disease ([place]).   Past Surgical History: She  has a past surgical history that includes Aortic valve replacement (2005); transthoracic echocardiogram (08/29/2012); NM MYOCAR PERF WALL MOTION (05/19/2010); Cardiac catheterization (11/10/2004); Cardiac catheterization (05/18/2004); Carotid Duplex (03/12/2012); Lower Ext. Duplex (03/12/2012); Colonoscopy with propofol (N/A, 01/22/2018); Esophagogastroduodenoscopy (egd) with propofol (N/A, 01/22/2018); Cholecystectomy (N/A, 03/01/2018); RIGHT HEART CATH (N/A, 06/27/2018); Esophagogastroduodenoscopy (egd) with propofol (N/A, 11/21/2018); Givens capsule study (N/A, 11/21/2018); Esophagogastroduodenoscopy (egd) with propofol (Left, 02/27/2019); Total abdominal hysterectomy w/ bilateral salpingoophorectomy (1989); and anterior repair (2009).    Medications: She has a current medication list which includes the following prescription(s): acetaminophen, alendronate, allopurinol, alprazolam, amlodipine, aspirin ec, atorvastatin, true metrix meter, cephalexin, vitamin d3, clonidine, vitamin b12, estradiol, famotidine, folic acid, furosemide, glipizide, true metrix blood glucose test, hydralazine, insulin aspart, b-d ins syr ultrafine .3cc/29g, lactose free nutrition, lidocaine, magnesium oxide, metoprolol succinate, mirabegron er, mycophenolate, ondansetron, pantoprazole, polyethylene glycol, prednisone, simethicone, spironolactone, tadalafil (pah), tresiba flextouch, true metrix blood glucose test, ascorbic acid, and warfarin.   Allergies: Patient is allergic to flagyl [metronidazole], ciprofloxacin, coreg [carvedilol], diflucan [fluconazole], losartan, privigen [immune globulin (human)], sulfamethoxazole-trimethoprim, verapamil, zetia [ezetimibe], zocor [simvastatin - high dose], and gatifloxacin.   Social History: Patient  reports that she has quit smoking. Her smoking use included cigarettes. She has a 30.00 pack-year smoking history. She has never used smokeless tobacco. She reports that she does not drink alcohol and does not use drugs.      OBJECTIVE     Physical Exam: Vitals:   05/10/22 1130  BP: (!) 165/53  Pulse: 78   Gen: No apparent distress, A&O x 3.  Detailed Urogynecologic Evaluation:  Normal external genitalia. #4 and #3 gellhorn pessaries placed but these were too uncomfortable for her. Placed a #4 cube pessary but this was uncomfortable with walking. Therefore a #3 cube pessary (Lot#[id number]) was placed. It was comfortable, fit well, and stayed in placed with strong cough, valsalva and bending.   Prior exam showed:  POP-Q:    POP-Q   3                                            Aa   3  Ba   3                                              C    6                                             Gh   4                                            Pb   6                                            tvl    -2                                            Ap   -2                                            Bp                                                  D      ASSESSMENT AND PLAN    [name] is a 81 y.o. with:  1. Prolapse of anterior vaginal wall   2. Uterovaginal prolapse, incomplete    - #3 cube pessary placed today. She will leave it in place until next visit - Will follow up with [facility] regarding the catheter.  - restart estrace cream 0.5g nightly for two weeks then twice a week after. Already has prescription.   Return 3-4 weeks for pessary check  Jaquita Folds, MD

## 2022-05-11 ENCOUNTER — Other Ambulatory Visit (HOSPITAL_COMMUNITY)
Admission: RE | Admit: 2022-05-11 | Discharge: 2022-05-11 | Disposition: A | Discharge status: Home / Self Care | Payer: Medicare HMO | Attending: Obstetrics and Gynecology | Admitting: Obstetrics and Gynecology

## 2022-05-11 ENCOUNTER — Encounter: Payer: Self-pay | Admitting: Obstetrics and Gynecology

## 2022-05-11 ENCOUNTER — Ambulatory Visit (INDEPENDENT_AMBULATORY_CARE_PROVIDER_SITE_OTHER): Payer: Medicare HMO | Admitting: Obstetrics and Gynecology

## 2022-05-11 VITALS — BP 148/77 | HR 79

## 2022-05-11 DIAGNOSIS — B961 Klebsiella pneumoniae [K. pneumoniae] as the cause of diseases classified elsewhere: Secondary | ICD-10-CM | POA: Insufficient documentation

## 2022-05-11 DIAGNOSIS — N993 Prolapse of vaginal vault after hysterectomy: Secondary | ICD-10-CM

## 2022-05-11 DIAGNOSIS — N8110 Cystocele, unspecified: Secondary | ICD-10-CM | POA: Diagnosis not present

## 2022-05-11 DIAGNOSIS — N390 Urinary tract infection, site not specified: Secondary | ICD-10-CM | POA: Insufficient documentation

## 2022-05-11 DIAGNOSIS — R82998 Other abnormal findings in urine: Secondary | ICD-10-CM

## 2022-05-11 NOTE — Progress Notes (Signed)
[facility] Return Visit  SUBJECTIVE  History of Present Illness: [name] is a 81 y.o. female seen in follow-up for prolapse.   She had a cube pessary placed yesterday. Pessary came out last night when she had a bowel movement. She does not want a pessary placed again today.   She complains of back pain and chills today.   Past Medical History: Patient  has a past medical history of Aortic atherosclerosis ([place]) (01/23/2018), Atrial fibrillation ([place]), Benign positional vertigo (10/06/2011), Carotid artery disease ([place]), Chemotherapy induced neutropenia ([place]) (07/21/2019), Cholelithiasis (01/23/2018), Cholelithiasis (01/23/2018), Chronic anticoagulation, Coronary artery disease, Diabetes mellitus, Hypercholesteremia, Hypertension, Mechanical heart valve present, Moderate to severe pulmonary hypertension ([place]), and Peripheral arterial disease ([place]).   Past Surgical History: She  has a past surgical history that includes Aortic valve replacement (2005); transthoracic echocardiogram (08/29/2012); NM MYOCAR PERF WALL MOTION (05/19/2010); Cardiac catheterization (11/10/2004); Cardiac catheterization (05/18/2004); Carotid Duplex (03/12/2012); Lower Ext. Duplex (03/12/2012); Colonoscopy with propofol (N/A, 01/22/2018); Esophagogastroduodenoscopy (egd) with propofol (N/A, 01/22/2018); Cholecystectomy (N/A, 03/01/2018); RIGHT HEART CATH (N/A, 06/27/2018); Esophagogastroduodenoscopy (egd) with propofol (N/A, 11/21/2018); Givens capsule study (N/A, 11/21/2018); Esophagogastroduodenoscopy (egd) with propofol (Left, 02/27/2019); Total abdominal hysterectomy w/ bilateral salpingoophorectomy (1989); and anterior repair (2009).   Medications: She has a current medication list which includes the following prescription(s): acetaminophen, alendronate, allopurinol, alprazolam, amlodipine, aspirin ec, atorvastatin, true metrix meter, cephalexin, vitamin d3, clonidine, vitamin b12, estradiol, famotidine, folic  acid, furosemide, glipizide, true metrix blood glucose test, hydralazine, insulin aspart, b-d ins syr ultrafine .3cc/29g, lactose free nutrition, lidocaine, magnesium oxide, metoprolol succinate, mirabegron er, mycophenolate, ondansetron, pantoprazole, polyethylene glycol, prednisone, simethicone, spironolactone, tadalafil (pah), tresiba flextouch, true metrix blood glucose test, ascorbic acid, and warfarin.   Allergies: Patient is allergic to flagyl [metronidazole], ciprofloxacin, coreg [carvedilol], diflucan [fluconazole], losartan, privigen [immune globulin (human)], sulfamethoxazole-trimethoprim, verapamil, zetia [ezetimibe], zocor [simvastatin - high dose], and gatifloxacin.   Social History: Patient  reports that she has quit smoking. Her smoking use included cigarettes. She has a 30.00 pack-year smoking history. She has never used smokeless tobacco. She reports that she does not drink alcohol and does not use drugs.      OBJECTIVE     Physical Exam: Vitals:   05/11/22 1142  BP: (!) 148/77  Pulse: 79   Gen: No apparent distress, A&O x 3. On palpation of right scapula, patient noted this is the tender area. No CVA tenderness.   Detailed Urogynecologic Evaluation:  Deferred. Prior exam showed:  POP-Q:    POP-Q   3                                            Aa   3                                           Ba   3                                              C    6  Gh   4                                            Pb   6                                            tvl    -2                                            Ap   -2                                            Bp                                                  D      POC urine: small leukocytes  ASSESSMENT AND PLAN    Ms. Hur is a 81 y.o. with:  1. Prolapse of anterior vaginal wall   2. Vaginal vault prolapse after hysterectomy   3. Leukocytes in urine    -  She is not sure if she wants a pessary replaced today or again in the future.  - Pt with emphysematous cystitis on CT but negative urine cultures. Still has catheter in place and has follow up with [facility] next week.  - Unclear if patient is having issued with urinary retention. At initial visit on 08/28/21, PVR was normal but patient expressed normal emptying. Would likely need urodynamic testing to diagnose urinary retention.  - She is concerned she has a UTI causing back pain, although pain is located near her scapula. Urine obtained today and sent for culture. She is currently taking keflex for cellulitis and has several allergies so no further antibiotics were given today.   Follow up as scheduled and we will see if she wants to proceed with a pessary at that time.   [name] Folds, MD  Time spent: I spent 25 minutes dedicated to the care of this patient on the date of this encounter to include pre-visit review of records, face-to-face time with the patient  and post visit documentation and ordering medication/ testing.

## 2022-05-12 ENCOUNTER — Telehealth: Payer: Self-pay

## 2022-05-12 ENCOUNTER — Other Ambulatory Visit: Payer: Self-pay

## 2022-05-12 ENCOUNTER — Inpatient Hospital Stay (HOSPITAL_BASED_OUTPATIENT_CLINIC_OR_DEPARTMENT_OTHER): Payer: Medicare HMO | Admitting: Physician Assistant

## 2022-05-12 ENCOUNTER — Inpatient Hospital Stay: Payer: Medicare HMO | Admitting: Adult Health

## 2022-05-12 ENCOUNTER — Inpatient Hospital Stay: Payer: Medicare HMO

## 2022-05-12 ENCOUNTER — Inpatient Hospital Stay: Payer: Medicare HMO | Attending: Oncology

## 2022-05-12 ENCOUNTER — Ambulatory Visit (INDEPENDENT_AMBULATORY_CARE_PROVIDER_SITE_OTHER): Payer: Medicare HMO | Admitting: *Deleted

## 2022-05-12 VITALS — BP 135/60 | HR 73 | Temp 98.0°F | Resp 15 | Wt 133.4 lb

## 2022-05-12 DIAGNOSIS — Z7952 Long term (current) use of systemic steroids: Secondary | ICD-10-CM | POA: Diagnosis not present

## 2022-05-12 DIAGNOSIS — M7989 Other specified soft tissue disorders: Secondary | ICD-10-CM | POA: Diagnosis not present

## 2022-05-12 DIAGNOSIS — L03115 Cellulitis of right lower limb: Secondary | ICD-10-CM | POA: Diagnosis not present

## 2022-05-12 DIAGNOSIS — L03119 Cellulitis of unspecified part of limb: Secondary | ICD-10-CM

## 2022-05-12 DIAGNOSIS — N184 Chronic kidney disease, stage 4 (severe): Secondary | ICD-10-CM | POA: Diagnosis not present

## 2022-05-12 DIAGNOSIS — Z7901 Long term (current) use of anticoagulants: Secondary | ICD-10-CM | POA: Diagnosis not present

## 2022-05-12 DIAGNOSIS — M8580 Other specified disorders of bone density and structure, unspecified site: Secondary | ICD-10-CM | POA: Diagnosis not present

## 2022-05-12 DIAGNOSIS — Z952 Presence of prosthetic heart valve: Secondary | ICD-10-CM

## 2022-05-12 DIAGNOSIS — D594 Other nonautoimmune hemolytic anemias: Secondary | ICD-10-CM

## 2022-05-12 DIAGNOSIS — I7782 Antineutrophilic cytoplasmic antibody (ANCA) vasculitis: Secondary | ICD-10-CM | POA: Diagnosis not present

## 2022-05-12 DIAGNOSIS — D591 Autoimmune hemolytic anemia, unspecified: Secondary | ICD-10-CM

## 2022-05-12 DIAGNOSIS — L03116 Cellulitis of left lower limb: Secondary | ICD-10-CM | POA: Diagnosis not present

## 2022-05-12 LAB — RETIC PANEL
Immature Retic Fract: 8.3 % (ref 2.3–15.9)
RBC.: 2.7 MIL/uL — ABNORMAL LOW (ref 3.87–5.11)
Retic Count, Absolute: 95.3 10*3/uL (ref 19.0–186.0)
Retic Ct Pct: 3.5 % — ABNORMAL HIGH (ref 0.4–3.1)
Reticulocyte Hemoglobin: 34.9 pg (ref >27.9)

## 2022-05-12 LAB — CBC WITH DIFFERENTIAL/PLATELET
Abs Immature Granulocytes: 0.04 10*3/uL (ref 0.00–0.07)
Basophils Absolute: 0 10*3/uL (ref 0.0–0.1)
Basophils Relative: 0 %
Eosinophils Absolute: 0 10*3/uL (ref 0.0–0.5)
Eosinophils Relative: 0 %
HCT: 24.7 % — ABNORMAL LOW (ref 36.0–46.0)
Hemoglobin: 8.3 g/dL — ABNORMAL LOW (ref 12.0–15.0)
Immature Granulocytes: 1 %
Lymphocytes Relative: 4 %
Lymphs Abs: 0.3 10*3/uL — ABNORMAL LOW (ref 0.7–4.0)
MCH: 30.5 pg (ref 26.0–34.0)
MCHC: 33.6 g/dL (ref 30.0–36.0)
MCV: 90.8 fL (ref 80.0–100.0)
Monocytes Absolute: 0.1 10*3/uL (ref 0.1–1.0)
Monocytes Relative: 2 %
Neutro Abs: 6 10*3/uL (ref 1.7–7.7)
Neutrophils Relative %: 93 %
Platelets: 121 10*3/uL — ABNORMAL LOW (ref 150–400)
RBC: 2.72 MIL/uL — ABNORMAL LOW (ref 3.87–5.11)
RDW: 18 % — ABNORMAL HIGH (ref 11.5–15.5)
WBC: 6.5 10*3/uL (ref 4.0–10.5)
nRBC: 0 % (ref 0.0–0.2)

## 2022-05-12 LAB — CMP (CANCER CENTER ONLY)
ALT: 23 U/L (ref 0–44)
AST: 26 U/L (ref 15–41)
Albumin: 3.6 g/dL (ref 3.5–5.0)
Alkaline Phosphatase: 70 U/L (ref 38–126)
Anion gap: 10 (ref 5–15)
BUN: 129 mg/dL — ABNORMAL HIGH (ref 8–23)
CO2: 17 mmol/L — ABNORMAL LOW (ref 22–32)
Calcium: 8.2 mg/dL — ABNORMAL LOW (ref 8.9–10.3)
Chloride: 104 mmol/L (ref 98–111)
Creatinine: 2.66 mg/dL — ABNORMAL HIGH (ref 0.44–1.00)
GFR, Estimated: 17 mL/min — ABNORMAL LOW (ref >60)
Glucose, Bld: 242 mg/dL — ABNORMAL HIGH (ref 70–99)
Potassium: 4.8 mmol/L (ref 3.5–5.1)
Sodium: 131 mmol/L — ABNORMAL LOW (ref 135–145)
Total Bilirubin: 0.5 mg/dL (ref 0.3–1.2)
Total Protein: 5.8 g/dL — ABNORMAL LOW (ref 6.5–8.1)

## 2022-05-12 LAB — PROTIME-INR
INR: 3.3 — ABNORMAL HIGH (ref 0.8–1.2)
Prothrombin Time: 33.6 seconds — ABNORMAL HIGH (ref 11.4–15.2)

## 2022-05-12 LAB — SAMPLE TO BLOOD BANK

## 2022-05-12 MED ORDER — CLINDAMYCIN HCL 300 MG PO CAPS: 300.0000 mg | ORAL_CAPSULE | Freq: Three times a day (TID) | ORAL | 0 refills | Status: DC

## 2022-05-12 NOTE — Telephone Encounter (Signed)
Secure chat message sent to Candace Hemphill, RN in the Anticoagulation Clinic to advise of today"s INR of 3.3  Per Candace:  Thank you very much. I will call the pt or dr regarding the INR and give further instructions.

## 2022-05-12 NOTE — Patient Instructions (Signed)
Description   Called and spoke with pt's son and pt over speaker phone and advised to hold today's dose of warfarin then change dose to 1 tablet daily except 1.5 tablets on Sundays and Fridays. [facility] Epic (normally done weekly).  Call with Prednisone doseage changes ('20mg'$  daily starting 7/5). Please keep green leafy veggies and boost in your diet. Call with any new meds [phone] & if you have any procedures where you need to hold warfarin, have them fax a clearance form or labs to [phone].

## 2022-05-12 NOTE — Telephone Encounter (Signed)
Can you call patient's son and notify him that along with the antibiotic,she should take a probiotic to prevent C.diff  Pt's son advised and voiced understanding.

## 2022-05-12 NOTE — Patient Outreach (Signed)
[facility]) Care Management  05/12/2022  [name] [birth year] [id number]   Telephone call to patient for follow up. Patient reports doing ok she is staying with her daughter.  Encouraged patient to accept help from her family.  She verbalized understanding but wants to go home but knows she is not strong enough yet.  Patient still has catheter but due to be removed on Monday.  No signs of infection.  Diabetes and heart failure management continues.  Care Plan : RN CM Plan of Care  Updates made by Jon Billings, RN since 05/12/2022 12:00 AM     Problem: Chronic disease management and care coordination needs of HF, DM, and Anemia   Priority: High     Long-Range Goal: Development of plan of care for management of HF and DM   Start Date: 01/13/2022  Expected End Date: 10/08/2022  This Visit's Progress: On track  Recent Progress: On track  Priority: High  Note:   Current Barriers:  Chronic Disease Management support and education needs related to CHF and DMII   RNCM Clinical Goal(s):  Patient will verbalize basic understanding of  CHF and DMII disease process and self health management plan as evidenced by No acute exacerbation of heart failure and blood sugars less than 300 continue to work with [facility] to address care management and care coordination needs related to  CHF and DMII as evidenced by adherence to CM Team Scheduled appointments through collaboration with RN Care manager, provider, and care team.   Interventions: Education and support related to heart failure and diabetes Inter-disciplinary care team collaboration (see longitudinal plan of care) Evaluation of current treatment plan related to  self management and patient's adherence to plan as established by provider   Heart Failure Interventions:  (Status:  Goal on track:  Yes.) Long Term Goal Basic overview and discussion of pathophysiology of Heart Failure reviewed Provided education on low  sodium diet Discussed importance of daily weight and advised patient to weigh and record daily  Diabetes Interventions:  (Status:  Goal on track:  Yes.) Long Term Goal Assessed patient's understanding of A1c goal: <7% Provided education to patient about basic DM disease process Reviewed medications with patient and discussed importance of medication adherence Discussed plans with patient for ongoing care management follow up and provided patient with direct contact information for care management team Discussed importance of monitoring carbohydrates and sugars.  Lab Results  Component Value Date   HGBA1C 4.6 (L) 02/24/2019  01/13/22 Patient having some problems with her anemia.  She is on prednisone and has had a blood transfusion.  Patient reports she is now on tresiba for her sugars 14 units daily.  Patient reports blood sugar however this morning was 253.  She is concerned with highs and will be calling physician to reports sugars and next steps.   Discussed importance of tight carbohydrate control to help with her sugars.  Weight up to 131 lbs. However, due to patient on prednisone explains weight gain.  CM reiterated signs of heart failure.    01/31/22 Patient continues to deal with her anemia.  However, prednisone has started to be tapered. Patient working with pharmacist to adjust [name] accordingly.  Patient reports highest sugar was 369 in the last 7-8 days.  Discussed affects of prednisone on sugars.  Patient weight today 127 lbs.  Patient continues with heart failure management such as no salt, daily weights, and watching for swelling.    02/13/22 Patient continues  with diabetes management.  Blood sugar continues to be up and down due to prednisone.  Discussed tight carbohydrate control.  Patient weight up since hospitalization due to blood transfusion and IV fluids.  Most recent weight 138 lbs.  Patient reports some swelling to legs.  Encouraged to limit salt intake and continue daily  weights.    02/28/22 Patient continues on prednisone causing her sugars to be irradict.  Patient on sliding scale now.  Discussed importance of continued diabetes management.  Patient weight at 137 lbs.  Heart failure management continues.    03/20/22 Patient blood sugars continue to be up and down due to prednisone use.  Patient blood sugar this am.  Patient weight 138 lbs.    04/06/22 Patient managing.  Blood sugars are less than 300.  Weight around 138 lbs.  Disease management reiterated.  05/12/22 Patient blood sugars are in the 200's during the day but 100 range in the am.  Patient continues to take prednisone for her anemia which has caused her sugars to be higher. Patient continues to manage sugars with sliding scale.  Heart failure management continues.  Patient Goals/Self-Care Activities: Heart Failure and Diabetes Take all medications as prescribed Attend all scheduled provider appointments call office if I gain more than 2 pounds in one day or 5 pounds in one week track weight in diary use salt in moderation watch for swelling in feet, ankles and legs every day weigh myself daily follow rescue plan if symptoms flare-up check blood sugar at prescribed times: twice daily enter blood sugar readings and medication or insulin into daily log take the blood sugar log to all doctor visits manage portion size Limit food high in carbohydrates such as rice, bread, potatoes, pasta  Follow Up Plan:  Telephone follow up appointment with care management team member scheduled for:  August The patient has been provided with contact information for the care management team and has been advised to call with any health related questions or concerns.      Long-Range Goal: Development of plan of care for management of anemia   Start Date: 02/13/2022  Expected End Date: 10/08/2022  This Visit's Progress: On track  Recent Progress: On track  Priority: High  Note:   Current Barriers:  Chronic  Disease Management support and education needs related to Anemia   RNCM Clinical Goal(s):  Patient will verbalize basic understanding of  Anemia disease process and self health management plan as evidenced by hemoglobin staying above 7.0  through collaboration with RN Care manager, provider, and care team.   Interventions: Education and support related to anemia Inter-disciplinary care team collaboration (see longitudinal plan of care) Evaluation of current treatment plan related to  self management and patient's adherence to plan as established by provider   Anemia  (Status:  Goal on track:  Yes.)  Long Term Goal Evaluation of current treatment plan related to  Anemia ,  self-management and patient's adherence to plan as established by provider. Discussed plans with patient for ongoing care management follow up and provided patient with direct contact information for care management team Evaluation of current treatment plan related to anemia and patient's adherence to plan as established by provider Provided education to patient re: Iron rich foods and importance of follow up with physician Discussed plans with patient for ongoing care management follow up and provided patient with direct contact information for care management team  Patient Goals/Self-Care Activities: Anemia Take all medications as prescribed Attend all scheduled provider  appointments Notify physician for any changes in condition such as shortness of breath and increased fatigue.   Pace self with activities Eat iron rich foods such as red meat, salmon, breakfast cereal with iron.    02/28/22 Patient reports doing very well.  Pleased with blood work on yesterday hemoglobin 9.2 and no transfusion needed.  Encouraged to eat protein and iron rich foods as she can.  Remains on prednisone but 15 mg this week per patient.  No concerns.    03/20/22 Patient continues to battle with anemia. She had a blood transfusion on Friday.   Patient reports feeling some better after.  Patient remains on prednisone at 20 mg.  Reiterated importance of iron rich foods.    04/06/22 Patient continues to manage with anemia.  Last Hemoglobin 8.7.  Appointment on 04/07/22.  No concerns.   05/12/22  Patient reports right now anemia stable. She has not needed a blood Transfusion.  Follow up continues with oncology.   Follow Up Plan:  Telephone follow up appointment with care management team member scheduled for:  August The patient has been provided with contact information for the care management team and has been advised to call with any health related questions or concerns.      Plan: Follow-up: Patient agrees to Care Plan and Follow-up. Follow-up again in August.    Mattheu Brodersen J Timarion Agcaoili, RN, MSN [place] [facility] Care Management Coordinator Direct Line [phone] Toll Free: [phone]  Fax: [phone]

## 2022-05-14 ENCOUNTER — Encounter: Payer: Self-pay | Admitting: Hematology and Oncology

## 2022-05-14 LAB — URINE CULTURE: Culture: >=100000 — AB

## 2022-05-14 NOTE — Progress Notes (Signed)
[facility] Follow up:    [name], [name], MD [place] [address]  DIAGNOSIS: Autoimmune hemolytic anemia  SUMMARY OF HEMATOLOGIC HISTORY:  GWENDOLYNE WELFORD is a 81 y.o. [place] woman with multifactorial anemia, as follows:             (a) anemia of chronic inflammation: As of May 2020 she has a SED rate of 95, CRP 1.1, positive ANA and RF; subsequently she was found to be ANCA positive             (b) hemolysis (mild): she has a mildly elevated LDH and a DAT positive for complement, not IgG; this is c/w (a) above             (c) anemia of renal insufficiency: inadequate EPO resonse to anemia                         (d) GIB/ iron deficiency in patient on lifelong anticoagulaton   (1) Feraheme received 03/01/2019, repeated 03/20/2019              (a) reticulocyte 03/07/2019 up from 43.4 a year ago to 191.8 after iron infusion             (b) feraheme repeated on 05/12/2019             (c) subsequent ferritin levels >100   (2) darbepoetin: starting 05/05/2019 at 141mg given every 2 weeks             (a) dose increased to 200 mcg every week starting 07/01/2019              (b) back to every 2 weeks beginning 08/04/2019 still at 200 mcg dose             (c) switched to Retacrit every 4 weeks as of 08/18/2019   (3) ANCA positive vasculitis: diagnosed 05/08/2019 (titer 1:640)             (a) prednisone 40 mg daily started 05/31/2019             (b) rituximab weekly started 06/12/2019 (received test dose 06/05/2019)                         (i) hepatitis B sAg, core Ab and HIV negative 05/08/2019                         (ii) rituximab held after 07/14/2019 dose (received 5 weekly doses) with neutropenia                         (iii) rituximab resumed 08/18/2019 but changed to monthly                         (iv) rituximab changed to every 2 months beginning with 02/02/2020 dose                         (v) rituximab changed to every 12 weeks after  the 10/12/2020 dose             (c) prednisone tapered to off as of 09/15/2019   (4) osteopenia with a T score of -1.5 on bone density 02/28/2016 at the breast center  CURRENT THERAPY: Prednisone  20 mg PO daily  INTERVAL HISTORY:  Taiwana Willison Finan 81 y.o. female returns for her follow-up of anemia.She is here with her son to the appointment. Ms. Levings reports that her energy levels are fairly stable. She does have fatigue but not more than her baseline. She denies any appetite changes. She denies nausea, vomiting or abdominal pain. Her bowel habits are unchanged without any recurrent episodes of diarrhea or constipation. She continues to have bilateral lower extremity edema and is currently taking Keflex due to presumed cellulitis in the lower extremities. The redness in the  left leg has worsening and is involving more of the lower leg. She denies fevers, chills, sweats ,chest pain or cough. She has no other complaints.  Rest of the pertinent 10 point ROS reviewed and negative.   Patient Active Problem List   Diagnosis Date Noted   C. difficile colitis 04/19/2022   Colitis 04/18/2022   PAH (pulmonary artery hypertension) ([place]) 04/18/2022   Hypokalemia 04/15/2022   Colovesical fistula, ruled out 04/14/2022   Emphysematous cystitis 04/13/2022   Female bladder prolapse 04/13/2022   Hyperkalemia 02/04/2022   Acute renal failure superimposed on stage 3b chronic kidney disease ([place]) 02/04/2022   Hyperglycemia 01/09/2022   Autoimmune hemolytic anemia ([place]) 01/02/2022   Bilateral lower extremity edema 01/02/2022   Refractory anemia ([place]) 12/15/2021   Abnormal chest x-ray 12/15/2021   Subtherapeutic international normalized ratio (INR) 12/15/2021   Elevated troponin 12/15/2021   Unilateral primary osteoarthritis, right hip 05/31/2021   Midline cystocele 05/30/2021   Primary localized osteoarthritis of pelvic region and thigh 05/30/2021   Vaginal pain 05/30/2021   Asymmetric SNHL  (sensorineural hearing loss) 01/06/2021   Referred otalgia of both ears 01/06/2021   Bilateral hearing loss 08/31/2020   Low back pain 02/20/2020   CKD (chronic kidney disease), stage IV ([place]) 10/13/2019   Leukopenia due to antineoplastic chemotherapy ([place]) 08/18/2019   Thrombophilia ([place]) 06/19/2019   Encounter for general adult medical examination without abnormal findings 06/13/2019   Arteritis ([place]) 06/06/2019   Hypertensive heart and chronic kidney disease with heart failure and stage 1 through stage 4 chronic kidney disease, or unspecified chronic kidney disease ([place]) 06/06/2019   Muscle weakness 06/06/2019   Pancytopenia ([place]) 05/28/2019   Dyspnea 05/28/2019   Vasculitis, ANCA positive 05/28/2019   Disorder of connective tissue ([place]) 05/14/2019   Renal insufficiency 05/08/2019   Acute on chronic diastolic CHF (congestive heart failure) ([place]) 04/07/2019   Left leg cellulitis 04/07/2019   PAF (paroxysmal atrial fibrillation) ([place]) 04/07/2019   Iron deficiency anemia 02/28/2019   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 ([place]) 02/28/2019   Diabetic retinopathy associated with type 2 diabetes mellitus ([place]) 09/02/2018   Chronic pain 06/19/2018   Non-thrombocytopenic purpura ([place]) 05/21/2018   Gout 03/22/2018   Malnutrition of moderate degree 03/12/2018   Diabetes mellitus type 2, controlled ([place]) 03/06/2018   S/P cholecystectomy 03/06/2018   H/O mechanical aortic valve replacement 03/06/2018   Moderate to severe pulmonary hypertension ([place]) 03/06/2018   GERD (gastroesophageal reflux disease) 02/21/2018   Anxiety 02/21/2018   Chronic diastolic CHF (congestive heart failure) ([place]) 02/21/2018   Diarrhea 02/21/2018   General weakness 02/02/2018   Thrombocytopenia ([place]) 01/25/2018   Arm pain, diffuse 01/25/2018   Anemia 01/23/2018   Aortic atherosclerosis ([place]) 01/23/2018   GI bleed 01/18/2018   Age-related osteoporosis without current pathological fracture  05/24/2017   Carotid artery occlusion 05/24/2017   Generalized anxiety disorder 05/24/2017   Hearing loss 05/24/2017   Presence of prosthetic heart valve 05/24/2017  Carotid artery disease ([place]) 08/18/2013   Peripheral arterial disease ([place]) 08/18/2013   Diabetes mellitus without complication ([place]) 69/67/8938   Hyponatremia 10/06/2011   Hypertension    Hypercholesteremia    Mechanical heart valve present    Chronic anticoagulation     is allergic to flagyl [metronidazole], augmentin [amoxicillin-pot clavulanate], ciprofloxacin, coreg [carvedilol], diflucan [fluconazole], losartan, privigen [immune globulin (human)], sulfamethoxazole-trimethoprim, verapamil, zetia [ezetimibe], zocor [simvastatin - high dose], and gatifloxacin.  MEDICAL HISTORY: Past Medical History:  Diagnosis Date   Aortic atherosclerosis ([place]) 01/23/2018   Atrial fibrillation (HCC)    Benign positional vertigo 10/06/2011   Carotid artery disease ([place])    Chemotherapy induced neutropenia ([place]) 07/21/2019   Cholelithiasis 01/23/2018   Cholelithiasis 01/23/2018   Chronic anticoagulation    Coronary artery disease    status post coronary artery bypass grafting times 07/10/2004   Diabetes mellitus    Hypercholesteremia    Hypertension    Mechanical heart valve present    H. aortic valve replacement at the time of bypass surgery October 2005   Moderate to severe pulmonary hypertension ([place])    Peripheral arterial disease ([place])    history of left common iliac artery PTA and stenting for a chronic total occlusion 08/26/01    SURGICAL HISTORY: Past Surgical History:  Procedure Laterality Date   anterior repair  2009   AORTIC VALVE REPLACEMENT  2005   Dr. Cyndia Bent   CARDIAC CATHETERIZATION  11/10/2004   40% right common illiac, 70% in stent restenosis of distal left common illiac,    CARDIAC CATHETERIZATION  05/18/2004   LAD 50-70% midstenosis, RCA dominant w/50% stenosis, 50% Right common Illiac artery ostial  stenosis, 90% in stent restenosis within midportion of left common illiac stent   Carotid Duplex  03/12/2012   RSA-elev. velocities suggestive of a 50-69% diameter reduction, Right&Left Bulb/Prox ICA-mild-mod.fibrous plaqueelevating Velocities abnormal study.   CHOLECYSTECTOMY N/A 03/01/2018   Procedure: LAPAROSCOPIC CHOLECYSTECTOMY;  Surgeon: Judeth Horn, MD;  Location: [facility];  Service: General;  Laterality: N/A;   COLONOSCOPY WITH PROPOFOL N/A 01/22/2018   Procedure: COLONOSCOPY WITH PROPOFOL;  Surgeon: Wilford Corner, MD;  Location: [facility];  Service: Endoscopy;  Laterality: N/A;   ESOPHAGOGASTRODUODENOSCOPY (EGD) WITH PROPOFOL N/A 01/22/2018   Procedure: ESOPHAGOGASTRODUODENOSCOPY (EGD) WITH PROPOFOL;  Surgeon: Wilford Corner, MD;  Location: [facility];  Service: Endoscopy;  Laterality: N/A;   ESOPHAGOGASTRODUODENOSCOPY (EGD) WITH PROPOFOL N/A 11/21/2018   Procedure: ESOPHAGOGASTRODUODENOSCOPY (EGD) WITH PROPOFOL;  Surgeon: [name], MD;  Location: [facility];  Service: Gastroenterology;  Laterality: N/A;   ESOPHAGOGASTRODUODENOSCOPY (EGD) WITH PROPOFOL Left 02/27/2019   Procedure: ESOPHAGOGASTRODUODENOSCOPY (EGD) WITH PROPOFOL;  Surgeon: Arta Silence, MD;  [facility] END[facility]COPY;  Service: Endoscopy;  Laterality: Left;   GIVENS CAPSULE STUDY N/A 11/21/2018   Procedure: GIVENS CAPSULE STUDY;  Surgeon: [name], MD;  Location: [facility];  Service: Gastroenterology;  Laterality: N/A;  To be deployed during EGD   Lower Ext. Duplex  03/12/2012   Right Proximal CIA- vessel narrowing w/elevated velocities 0-49% diameter reduction. Right SFA-mild mixed density plaque throughout vessel.   NM MYOCAR PERF WALL MOTION  05/19/2010   protocol: Persantine, post stress EF 65%, negative for ischemia, low risk scan   RIGHT HEART CATH N/A 06/27/2018   Procedure: RIGHT HEART CATH;  Surgeon: [name], MD;  Location: [facility];  Service: Cardiovascular;  Laterality: N/A;    TOTAL ABDOMINAL HYSTERECTOMY W/ BILATERAL SALPINGOOPHORECTOMY  1989   TRANSTHORACIC ECHOCARDIOGRAM  08/29/2012   Moderately calcified annulus  of mitral valve, moderate regurg. of both mitral valve and tricuspid valve.     SOCIAL HISTORY: Social History   Socioeconomic History   Marital status: Widowed    Spouse name: Not on file   Number of children: Not on file   Years of education: Not on file   Highest education level: Not on file  Occupational History   Occupation: Retired  Tobacco Use   Smoking status: Former    Packs/day: 1.00    Years: 30.00    Total pack years: 30.00    Types: Cigarettes   Smokeless tobacco: Never   Tobacco comments:    quit smoking 2005  Vaping Use   Vaping Use: Never used  Substance and Sexual Activity   Alcohol use: No    Alcohol/week: 0.0 standard drinks of alcohol   Drug use: No   Sexual activity: Not Currently    Birth control/protection: None  Other Topics Concern   Not on file  Social History Narrative   Not on file   Social Determinants of Health   Financial Resource Strain: Low Risk  (02/28/2022)   Overall Financial Resource Strain (CARDIA)    Difficulty of Paying Living Expenses: Not hard at all  Food Insecurity: No Food Insecurity (01/13/2022)   Hunger Vital Sign    Worried About Running Out of Food in the Last Year: Never true    [place] in the Last Year: Never true  Transportation Needs: No Transportation Needs (05/12/2022)   PRAPARE - Hydrologist (Medical): No    Lack of Transportation (Non-Medical): No  Physical Activity: Inactive (02/28/2022)   Exercise Vital Sign    Days of Exercise per Week: 0 days    Minutes of Exercise per Session: 0 min  Stress: No Stress Concern Present (02/28/2022)   [place]    Feeling of Stress : Only a little  Social Connections: Socially Isolated (11/25/2019)   Social Connection and Isolation  Panel [NHANES]    Frequency of Communication with Friends and Family: More than three times a week    Frequency of Social Gatherings with Friends and Family: Once a week    Attends Religious Services: Never    Marine scientist or Organizations: No    Attends Archivist Meetings: Never    Marital Status: Widowed  Intimate Partner Violence: Not At Risk (02/28/2022)   Humiliation, Afraid, Rape, and Kick questionnaire    Fear of Current or Ex-Partner: No    Emotionally Abused: No    Physically Abused: No    Sexually Abused: No    FAMILY HISTORY: Family History  Problem Relation Age of Onset   Heart disease Father    Hypertension Father    Hyperlipidemia Father    Breast cancer Neg Hx     Review of Systems  Constitutional:  Positive for fatigue. Negative for appetite change, chills, fever and unexpected weight change.  HENT:   Negative for hearing loss, lump/mass and trouble swallowing.   Eyes:  Negative for eye problems and icterus.  Respiratory:  Positive for shortness of breath. Negative for chest tightness and cough.   Cardiovascular:  Positive for leg swelling. Negative for chest pain and palpitations.  Gastrointestinal:  Negative for abdominal distention, abdominal pain, constipation, diarrhea, nausea and vomiting.  Endocrine: Negative for hot flashes.  Genitourinary:  Negative for difficulty urinating.   Musculoskeletal:  Negative for arthralgias.  Skin:  Positive for rash. Negative for itching.  Neurological:  Negative for dizziness, extremity weakness, headaches and numbness.  Hematological:  Negative for adenopathy. Does not bruise/bleed easily.  Psychiatric/Behavioral:  Negative for depression. The patient is not nervous/anxious.       PHYSICAL EXAMINATION  ECOG PERFORMANCE STATUS: 1 - Symptomatic but completely ambulatory  Vitals:   05/12/22 1310  BP: 135/60  Pulse: 73  Resp: 15  Temp: 98 F (36.7 C)  SpO2: 100%    Physical  Exam Constitutional:      General: She is not in acute distress.    Appearance: Normal appearance. She is not toxic-appearing.     Comments: She is in a wheelchair  HENT:     Head: Normocephalic and atraumatic.  Eyes:     General: No scleral icterus. Cardiovascular:     Rate and Rhythm: Normal rate and regular rhythm.     Pulses: Normal pulses.     Heart sounds: Normal heart sounds.  Pulmonary:     Effort: Pulmonary effort is normal.     Breath sounds: Normal breath sounds.  Abdominal:     General: Abdomen is flat. Bowel sounds are normal. There is no distension.     Palpations: Abdomen is soft.     Tenderness: There is no abdominal tenderness. There is no guarding.  Musculoskeletal:        General: Swelling (Bilateral lower extremity swelling with worsening erythema involving the left lower extremity more than the right lower extremity.) present.     Cervical back: Neck supple.  Lymphadenopathy:     Cervical: No cervical adenopathy.  Skin:    General: Skin is warm and dry.     Findings: No rash.  Neurological:     General: No focal deficit present.     Mental Status: She is alert.  Psychiatric:        Mood and Affect: Mood normal.        Behavior: Behavior normal.     LABORATORY DATA:  CBC    Component Value Date/Time   WBC 6.5 05/12/2022 1237   RBC 2.72 (L) 05/12/2022 1237   RBC 2.70 (L) 05/12/2022 1237   HGB 8.3 (L) 05/12/2022 1237   HGB 10.2 (L) 03/24/2022 1008   HCT 24.7 (L) 05/12/2022 1237   HCT 28.5 (L) 11/20/2018 1824   PLT 121 (L) 05/12/2022 1237   PLT 122 (L) 03/24/2022 1008   MCV 90.8 05/12/2022 1237   MCH 30.5 05/12/2022 1237   MCHC 33.6 05/12/2022 1237   RDW 18.0 (H) 05/12/2022 1237   LYMPHSABS 0.3 (L) 05/12/2022 1237   MONOABS 0.1 05/12/2022 1237   EOSABS 0.0 05/12/2022 1237   BASOSABS 0.0 05/12/2022 1237    CMP     Component Value Date/Time   NA 131 (L) 05/12/2022 1237   NA 137 12/06/2017 1436   K 4.8 05/12/2022 1237   CL 104  05/12/2022 1237   CO2 17 (L) 05/12/2022 1237   GLUCOSE 242 (H) 05/12/2022 1237   BUN 129 (H) 05/12/2022 1237   BUN 20 12/06/2017 1436   CREATININE 2.66 (H) 05/12/2022 1237   CALCIUM 8.2 (L) 05/12/2022 1237   PROT 5.8 (L) 05/12/2022 1237   ALBUMIN 3.6 05/12/2022 1237   AST 26 05/12/2022 1237   ALT 23 05/12/2022 1237   ALKPHOS 70 05/12/2022 1237   BILITOT 0.5 05/12/2022 1237   GFRNONAA 17 (L) 05/12/2022 1237   GFRAA 32 (L) 03/30/2020 0932     ASSESSMENT and THERAPY  PLAN:  JENNESS STEMLER is a 81 y.o. female who presents for multifactorial anemia, previously treated with rituximab for ANCA positive hemolytic anemia, previously on maintenance rituximab every 12 weeks presented with worsening anemia thought to be related to her hemolysis.  She was diagnosed with elevated ANCA titers 1 is to 640 and previously treated for ANCA positive hemolytic anemia, seen by rheumatology Dr. Salome Holmes back in 2020, responded at that time to prednisone and rituximab and continued on rituximab maintenance.  However her rituximab was changed to every 96-monthinfusions and she relapsed before her second cycle of every 34-monthituximab.   # Multifactorial Anemia:  --Bone marrow biopsy from 02/20/2022 showed normocellular marrow with trilineage hematopoiesis. No leukemia, lymphoma or high-grade dysplasia identified. --CT CAP from 02/20/2022 did not show any underlying cause for anemia.  --ANCA titers are normal when checked on 02/10/2022 --Currently on prednisone 20 mg PO daily, mycophenolate was increased to 1000 mg however this was discontinued while she was hospitalized and she continued to maintain her hemoglobin while she is on prednisone. -- So far we have attempted prednisone taper 4 times and this was not successful.  Every time we dropped her dose below 20 mg, she has worsening hemolysis and anemia requiring blood transfusion.  We have talked on multiple instances about the complications with steroids  including hyperglycemia, lower extremity swelling and however since this seems to be the only effective and reasonably well-tolerated medication, we have agreed to continue it at the current dose.   --[facility] any additional recommendations.   --Consider PRBC transfusion if hemoglobin is less than adequately to 8. --Labs today reviewed with Hgb 8.3, Plt 121. No indication for blood transfusion today.  --RTC for weekly labs +/- transfusions and follow up with Dr. IrChryl Heckn 2 weeks  #Cellulitis involving the legs: --Left greater than right.  --Worsening while on Keflex, due to finish course tomorrow --Discussed with Dr. IrChryl Hecknd recommend to switch antibiotic therapy as she is not exhibiting any systemic symptoms including fevers, leukocytosis, etc. Patient has limitations on antibiotic therapy due to warfarin use, prior allergies, recent c.diff infection.  --Sent Clindamycin 300 mg TID x 7 days. Advised to take probiotic daily with antibiotics.  --[facility], to assess if there is any improvement or if admission is required for IV therapy.  --Strict precautions to go to ER if she develops fever.   #Warfarin therapy: --Under the care of  [facility]. --Today's INR was 3.3. We have notified coumadin clinic to follow up with patient.   Patient expressed understanding of the plan provided. She is aware to reach out if she has any new questions or concerns.   I have spent a total of 30 minutes minutes of face-to-face and non-face-to-face time, preparing to see the patient, performing a medically appropriate examination, counseling and educating the patient, documenting clinical information in the electronic health record, and care coordination.   IrDede QueryA-C Dept of Hematology and [facility]: [phone]

## 2022-05-15 ENCOUNTER — Encounter: Payer: Self-pay | Admitting: Hematology and Oncology

## 2022-05-15 ENCOUNTER — Inpatient Hospital Stay (HOSPITAL_BASED_OUTPATIENT_CLINIC_OR_DEPARTMENT_OTHER): Payer: Medicare HMO | Admitting: Physician Assistant

## 2022-05-15 ENCOUNTER — Other Ambulatory Visit (HOSPITAL_COMMUNITY): Payer: Self-pay

## 2022-05-15 VITALS — BP 134/53 | HR 68 | Temp 98.0°F | Resp 20 | Wt 135.5 lb

## 2022-05-15 DIAGNOSIS — Z7952 Long term (current) use of systemic steroids: Secondary | ICD-10-CM | POA: Diagnosis not present

## 2022-05-15 DIAGNOSIS — Z7901 Long term (current) use of anticoagulants: Secondary | ICD-10-CM | POA: Diagnosis not present

## 2022-05-15 DIAGNOSIS — Z952 Presence of prosthetic heart valve: Secondary | ICD-10-CM | POA: Diagnosis not present

## 2022-05-15 DIAGNOSIS — R338 Other retention of urine: Secondary | ICD-10-CM | POA: Diagnosis not present

## 2022-05-15 DIAGNOSIS — D591 Autoimmune hemolytic anemia, unspecified: Secondary | ICD-10-CM

## 2022-05-15 DIAGNOSIS — I7782 Antineutrophilic cytoplasmic antibody (ANCA) vasculitis: Secondary | ICD-10-CM | POA: Diagnosis not present

## 2022-05-15 DIAGNOSIS — R110 Nausea: Secondary | ICD-10-CM

## 2022-05-15 DIAGNOSIS — M7989 Other specified soft tissue disorders: Secondary | ICD-10-CM | POA: Diagnosis not present

## 2022-05-15 DIAGNOSIS — M8580 Other specified disorders of bone density and structure, unspecified site: Secondary | ICD-10-CM | POA: Diagnosis not present

## 2022-05-15 DIAGNOSIS — L03116 Cellulitis of left lower limb: Secondary | ICD-10-CM | POA: Diagnosis not present

## 2022-05-15 DIAGNOSIS — R600 Localized edema: Secondary | ICD-10-CM | POA: Diagnosis not present

## 2022-05-15 DIAGNOSIS — N3000 Acute cystitis without hematuria: Secondary | ICD-10-CM | POA: Diagnosis not present

## 2022-05-15 DIAGNOSIS — L03115 Cellulitis of right lower limb: Secondary | ICD-10-CM | POA: Diagnosis not present

## 2022-05-15 DIAGNOSIS — D594 Other nonautoimmune hemolytic anemias: Secondary | ICD-10-CM

## 2022-05-15 DIAGNOSIS — N184 Chronic kidney disease, stage 4 (severe): Secondary | ICD-10-CM | POA: Diagnosis not present

## 2022-05-15 LAB — CMP (CANCER CENTER ONLY)
ALT: 29 U/L (ref 0–44)
AST: 37 U/L (ref 15–41)
Albumin: 3.6 g/dL (ref 3.5–5.0)
Alkaline Phosphatase: 71 U/L (ref 38–126)
Anion gap: 8 (ref 5–15)
BUN: 123 mg/dL — ABNORMAL HIGH (ref 8–23)
CO2: 20 mmol/L — ABNORMAL LOW (ref 22–32)
Calcium: 8.5 mg/dL — ABNORMAL LOW (ref 8.9–10.3)
Chloride: 105 mmol/L (ref 98–111)
Creatinine: 2.41 mg/dL — ABNORMAL HIGH (ref 0.44–1.00)
GFR, Estimated: 19 mL/min — ABNORMAL LOW (ref >60)
Glucose, Bld: 88 mg/dL (ref 70–99)
Potassium: 4.7 mmol/L (ref 3.5–5.1)
Sodium: 133 mmol/L — ABNORMAL LOW (ref 135–145)
Total Bilirubin: 0.5 mg/dL (ref 0.3–1.2)
Total Protein: 5.9 g/dL — ABNORMAL LOW (ref 6.5–8.1)

## 2022-05-15 LAB — RETIC PANEL
Immature Retic Fract: 12.7 % (ref 2.3–15.9)
RBC.: 2.64 MIL/uL — ABNORMAL LOW (ref 3.87–5.11)
Retic Count, Absolute: 125.9 10*3/uL (ref 19.0–186.0)
Retic Ct Pct: 4.8 % — ABNORMAL HIGH (ref 0.4–3.1)
Reticulocyte Hemoglobin: 34.8 pg (ref >27.9)

## 2022-05-15 LAB — SAMPLE TO BLOOD BANK

## 2022-05-15 LAB — CBC WITH DIFFERENTIAL/PLATELET
Abs Immature Granulocytes: 0.07 10*3/uL (ref 0.00–0.07)
Basophils Absolute: 0 10*3/uL (ref 0.0–0.1)
Basophils Relative: 0 %
Eosinophils Absolute: 0 10*3/uL (ref 0.0–0.5)
Eosinophils Relative: 0 %
HCT: 23.7 % — ABNORMAL LOW (ref 36.0–46.0)
Hemoglobin: 8.3 g/dL — ABNORMAL LOW (ref 12.0–15.0)
Immature Granulocytes: 1 %
Lymphocytes Relative: 5 %
Lymphs Abs: 0.4 10*3/uL — ABNORMAL LOW (ref 0.7–4.0)
MCH: 31.6 pg (ref 26.0–34.0)
MCHC: 35 g/dL (ref 30.0–36.0)
MCV: 90.1 fL (ref 80.0–100.0)
Monocytes Absolute: 0.2 10*3/uL (ref 0.1–1.0)
Monocytes Relative: 3 %
Neutro Abs: 6 10*3/uL (ref 1.7–7.7)
Neutrophils Relative %: 91 %
Platelets: 121 10*3/uL — ABNORMAL LOW (ref 150–400)
RBC: 2.63 MIL/uL — ABNORMAL LOW (ref 3.87–5.11)
RDW: 18.2 % — ABNORMAL HIGH (ref 11.5–15.5)
WBC: 6.7 10*3/uL (ref 4.0–10.5)
nRBC: 0 % (ref 0.0–0.2)

## 2022-05-15 LAB — PROTIME-INR
INR: 2.1 — ABNORMAL HIGH (ref 0.8–1.2)
Prothrombin Time: 23.6 seconds — ABNORMAL HIGH (ref 11.4–15.2)

## 2022-05-15 MED ORDER — ONDANSETRON 4 MG PO TBDP: 4.0000 mg | ORAL_TABLET | Freq: Three times a day (TID) | ORAL | 0 refills | Status: DC | PRN

## 2022-05-15 MED ORDER — NITROFURANTOIN MONOHYD MACRO 100 MG PO CAPS: 100.0000 mg | ORAL_CAPSULE | Freq: Two times a day (BID) | ORAL | 0 refills | Status: AC

## 2022-05-15 MED ORDER — NYSTATIN 100000 UNIT/ML MT SUSP
Freq: Three times a day (TID) | OROMUCOSAL | 0 refills | Status: DC
  Filled 2022-05-15: qty 150, 10d supply, fill #0

## 2022-05-15 NOTE — Progress Notes (Signed)
Symptom Management Consult note [place]    Patient Care Team: [name], [name], MD as PCP - General (Internal Medicine) [name], MD as PCP - Advanced Heart Failure (Cardiology) Lorretta Harp, MD as PCP - Cardiology (Cardiology) Arta Silence, MD as Consulting Physician (Gastroenterology) [name], MD as Consulting Physician (Cardiology) Marzetta Board, DPM as Consulting Physician (Podiatry) Gavin Pound, MD as Consulting Physician (Rheumatology) Jon Billings, RN as [place] Management Corliss Parish, MD as Consulting Physician (Nephrology) Mcarthur Rossetti, MD as Consulting Physician (Orthopedic Surgery)    Name of the patient: [name]  [id number]  02/08/1941   Date of visit: 05/15/2022    Chief complaint/ Reason for visit- cellulitis recheck  Oncology History   No history exists.    Current Therapy: Prednisone 20 mg daily    Interval history- [name] is a 81 y.o. with oncologic history as above presenting to [facility] today with chief complaint of cellulitis recheck. Patient is accompanied by her daughter [name] who provides additional history. Patient has recent hospital admission 04/18/22-05/01/22 for emphysematous cystitis and C. Difficile. Patient was seen by oncologist 05/05/22 and started on Keflex for redness in her legs concerning for cellulitis. She took 5 days of antibitcs and was seen by oncology team x 3 days ago for recheck and there was concern for worsening infection so she was switched to clindamycin which she has now taken for 2.5 days. Patient thinks the redness of her right leg has improved and the left leg looks unchanged. She denies any associated pain. She states the swelling in her legs has worsened since recent visit which she attributes to her current prednisone use. She states the swelling "is pretty normal" for her while taking prednisone. She denies any shortness  of breath or chest pain. She is asking for a refill for zofran as she has intermittent nausea, none currently and she is denying any abdominal pain. She ate breakfast this morning without difficulty. Patient also reports mouth sores x 1 day which she has had in the past when taking PO antibiotics. She has a lidocaine solution at home although did not try it today for the mild mouth pain she is experiencing. Patient also had a nose bleed x 3 days ago that she describes as a few drops that resolved with holding pressure after 2 minutes and is asking to have her INR checked. She denies fever or chills. Denies any fall, injury, or trauma to her legs.   ROS  All other systems are reviewed and are negative for acute change except as noted in the HPI.    Allergies  Allergen Reactions   Flagyl [Metronidazole] Rash and Other (See Comments)    ALL-OVER BODY RASH   Augmentin [Amoxicillin-Pot Clavulanate] Diarrhea and Nausea And Vomiting   Ciprofloxacin Nausea Only and Other (See Comments)    Stomach ache   Coreg [Carvedilol] Other (See Comments)    Terrible cramping in the feet and had a lot of bowel movements, but not diarrhea   Diflucan [Fluconazole] Diarrhea   Losartan Swelling and Other (See Comments)    Patient doesn't recall site of swelling   Privigen [Immune Globulin (Human)] Other (See Comments)    Severe/excruciating pain   Sulfamethoxazole-Trimethoprim Nausea Only and Other (See Comments)    Stomach upset   Verapamil Hives   Zetia [Ezetimibe] Other (See Comments)    Reaction not recalled   Zocor [Simvastatin - High Dose]  Other (See Comments)    Reaction not recalled   Gatifloxacin Rash and Other (See Comments)    Redness to skin around eye     Past Medical History:  Diagnosis Date   Aortic atherosclerosis ([place]) 01/23/2018   Atrial fibrillation (HCC)    Benign positional vertigo 10/06/2011   Carotid artery disease ([place])    Chemotherapy induced neutropenia ([place]) 07/21/2019    Cholelithiasis 01/23/2018   Cholelithiasis 01/23/2018   Chronic anticoagulation    Coronary artery disease    status post coronary artery bypass grafting times 07/10/2004   Diabetes mellitus    Hypercholesteremia    Hypertension    Mechanical heart valve present    H. aortic valve replacement at the time of bypass surgery October 2005   Moderate to severe pulmonary hypertension ([place])    Peripheral arterial disease ([place])    history of left common iliac artery PTA and stenting for a chronic total occlusion 08/26/01     Past Surgical History:  Procedure Laterality Date   anterior repair  2009   AORTIC VALVE REPLACEMENT  2005   Dr. Cyndia Bent   CARDIAC CATHETERIZATION  11/10/2004   40% right common illiac, 70% in stent restenosis of distal left common illiac,    CARDIAC CATHETERIZATION  05/18/2004   LAD 50-70% midstenosis, RCA dominant w/50% stenosis, 50% Right common Illiac artery ostial stenosis, 90% in stent restenosis within midportion of left common illiac stent   Carotid Duplex  03/12/2012   RSA-elev. velocities suggestive of a 50-69% diameter reduction, Right&Left Bulb/Prox ICA-mild-mod.fibrous plaqueelevating Velocities abnormal study.   CHOLECYSTECTOMY N/A 03/01/2018   Procedure: LAPAROSCOPIC CHOLECYSTECTOMY;  Surgeon: Judeth Horn, MD;  Location: [place];  Service: General;  Laterality: N/A;   COLONOSCOPY WITH PROPOFOL N/A 01/22/2018   Procedure: COLONOSCOPY WITH PROPOFOL;  Surgeon: Wilford Corner, MD;  Location: [place];  Service: Endoscopy;  Laterality: N/A;   ESOPHAGOGASTRODUODENOSCOPY (EGD) WITH PROPOFOL N/A 01/22/2018   Procedure: ESOPHAGOGASTRODUODENOSCOPY (EGD) WITH PROPOFOL;  Surgeon: Wilford Corner, MD;  Location: [place];  Service: Endoscopy;  Laterality: N/A;   ESOPHAGOGASTRODUODENOSCOPY (EGD) WITH PROPOFOL N/A 11/21/2018   Procedure: ESOPHAGOGASTRODUODENOSCOPY (EGD) WITH PROPOFOL;  Surgeon: [name], MD;  Location: [place];  Service: Gastroenterology;   Laterality: N/A;   ESOPHAGOGASTRODUODENOSCOPY (EGD) WITH PROPOFOL Left 02/27/2019   Procedure: ESOPHAGOGASTRODUODENOSCOPY (EGD) WITH PROPOFOL;  Surgeon: Arta Silence, MD;  Location: [facility] ENDOSCOPY;  Service: Endoscopy;  Laterality: Left;   GIVENS CAPSULE STUDY N/A 11/21/2018   Procedure: GIVENS CAPSULE STUDY;  Surgeon: [name], MD;  Location: [place];  Service: Gastroenterology;  Laterality: N/A;  To be deployed during EGD   Lower Ext. Duplex  03/12/2012   Right Proximal CIA- vessel narrowing w/elevated velocities 0-49% diameter reduction. Right SFA-mild mixed density plaque throughout vessel.   NM MYOCAR PERF WALL MOTION  05/19/2010   protocol: Persantine, post stress EF 65%, negative for ischemia, low risk scan   RIGHT HEART CATH N/A 06/27/2018   Procedure: RIGHT HEART CATH;  Surgeon: [name], MD;  Location: [place] [facility];  Service: Cardiovascular;  Laterality: N/A;   TOTAL ABDOMINAL HYSTERECTOMY W/ BILATERAL SALPINGOOPHORECTOMY  1989   TRANSTHORACIC ECHOCARDIOGRAM  08/29/2012   Moderately calcified annulus of mitral valve, moderate regurg. of both mitral valve and tricuspid valve.     Social History   Socioeconomic History   Marital status: Widowed    Spouse name: Not on file   Number of children: Not on file   Years of education: Not on file  Highest education level: Not on file  Occupational History   Occupation: Retired  Tobacco Use   Smoking status: Former    Packs/day: 1.00    Years: 30.00    Total pack years: 30.00    Types: Cigarettes   Smokeless tobacco: Never   Tobacco comments:    quit smoking 2005  Vaping Use   Vaping Use: Never used  Substance and Sexual Activity   Alcohol use: No    Alcohol/week: 0.0 standard drinks of alcohol   Drug use: No   Sexual activity: Not Currently    Birth control/protection: None  Other Topics Concern   Not on file  Social History Narrative   Not on file   Social Determinants of Health   Financial  Resource Strain: Low Risk  (02/28/2022)   Overall Financial Resource Strain (CARDIA)    Difficulty of Paying Living Expenses: Not hard at all  Food Insecurity: No Food Insecurity (01/13/2022)   Hunger Vital Sign    Worried About Running Out of Food in the Last Year: Never true    [place] in the Last Year: Never true  Transportation Needs: No Transportation Needs (05/12/2022)   PRAPARE - Hydrologist (Medical): No    Lack of Transportation (Non-Medical): No  Physical Activity: Inactive (02/28/2022)   Exercise Vital Sign    Days of Exercise per Week: 0 days    Minutes of Exercise per Session: 0 min  Stress: No Stress Concern Present (02/28/2022)   [place]    Feeling of Stress : Only a little  Social Connections: Socially Isolated (11/25/2019)   Social Connection and Isolation Panel [NHANES]    Frequency of Communication with Friends and Family: More than three times a week    Frequency of Social Gatherings with Friends and Family: Once a week    Attends Religious Services: Never    Marine scientist or Organizations: No    Attends Archivist Meetings: Never    Marital Status: Widowed  Intimate Partner Violence: Not At Risk (02/28/2022)   Humiliation, Afraid, Rape, and Kick questionnaire    Fear of Current or Ex-Partner: No    Emotionally Abused: No    Physically Abused: No    Sexually Abused: No    Family History  Problem Relation Age of Onset   Heart disease Father    Hypertension Father    Hyperlipidemia Father    Breast cancer Neg Hx      Current Outpatient Medications:    nystatin-alum & mag hydroxide-simeth-diphenhydrAMINE, Swish 5 mLs by mouth 3 times daily then spit, Disp: 150 mL, Rfl: 0   acetaminophen (TYLENOL) 325 MG tablet, Take 650 mg by mouth every 6 (six) hours as needed for mild pain or fever., Disp: , Rfl:    alendronate (FOSAMAX) 70 MG tablet,  Take 70 mg by mouth every Saturday., Disp: , Rfl:    allopurinol (ZYLOPRIM) 100 MG tablet, Take 100 mg by mouth daily., Disp: , Rfl:    ALPRAZolam (XANAX) 0.5 MG tablet, Take 0.25-0.5 mg by mouth at bedtime., Disp: , Rfl:    amLODipine (NORVASC) 10 MG tablet, TAKE 1 TABLET EVERY DAY (Patient taking differently: Take 10 mg by mouth daily.), Disp: 90 tablet, Rfl: 3   aspirin EC 81 MG tablet, Take 81 mg by mouth daily. Swallow whole., Disp: , Rfl:    atorvastatin (LIPITOR) 80 MG tablet, Take 1 tablet (  80 mg total) by mouth daily at 6 PM., Disp: , Rfl:    Blood Glucose Monitoring Suppl (TRUE METRIX METER) w/Device KIT, , Disp: , Rfl:    Cholecalciferol (VITAMIN D3) 50 MCG (2000 UT) capsule, Take 2,000 Units by mouth daily with lunch., Disp: , Rfl:    clindamycin (CLEOCIN) 300 MG capsule, Take 1 capsule (300 mg total) by mouth 3 (three) times daily., Disp: 21 capsule, Rfl: 0   cloNIDine (CATAPRES) 0.2 MG tablet, TAKE 1 TABLET TWICE DAILY (Patient taking differently: Take 0.2 mg by mouth 2 (two) times daily.), Disp: 180 tablet, Rfl: 3   Cyanocobalamin (VITAMIN B12) 1000 MCG TBCR, Take 1,000 mcg by mouth daily with lunch., Disp: , Rfl:    estradiol (ESTRACE) 0.1 MG/GM vaginal cream, Place 0.5g nightly for two weeks then twice a week after (Patient taking differently: Place 1 Applicatorful vaginally 3 (three) times a week.), Disp: 30 g, Rfl: 11   famotidine (PEPCID) 20 MG tablet, Take 1 tablet (20 mg total) by mouth 2 (two) times daily., Disp: 60 tablet, Rfl: 3   folic acid (FOLVITE) 1 MG tablet, Take 1 mg by mouth daily., Disp: , Rfl:    furosemide (LASIX) 40 MG tablet, TAKE 2 TABLETS (80 MG TOTAL) IN MORNING AND 1 AND 1/2 TABLETS (60 MG TOTAL) IN EVENING. CHANGE IN DOSAGE. (Patient taking differently: Take 40-80 mg by mouth See admin instructions. Take 80 mg by mouth in the morning and 40 mg in the afternoon), Disp: 315 tablet, Rfl: 0   glipiZIDE (GLUCOTROL) 5 MG tablet, Take 1 tablet (5 mg total) by mouth  daily., Disp: 30 tablet, Rfl: 0   glucose blood (TRUE METRIX BLOOD GLUCOSE TEST) test strip, Use 1-2 times daily, Disp: , Rfl:    hydrALAZINE (APRESOLINE) 100 MG tablet, TAKE 1 TABLET THREE TIMES DAILY (Patient taking differently: Take 100 mg by mouth 3 (three) times daily.), Disp: 270 tablet, Rfl: 3   insulin aspart (NOVOLOG) 100 UNIT/ML injection, Take 2 units for blood sugar 200-250, 4 units for blood sugar 252-300, 6 units for blood sugar 301-350, 8 units for blood sugar greater than 350 and call MD. (Patient taking differently: Inject 2-8 Units into the skin See admin instructions. Inject 2-8 units into the skin three times a day with meals, PER SLIDING SCALE:  2 units for blood sugar 200-250, 4 units for blood sugar 252-300, 6 units for blood sugar 301-350, 8 units for blood sugar greater than 350 and call MD.), Disp: 10 mL, Rfl: 11   Insulin Syringe-Needle U-100 (B-D INS SYR ULTRAFINE .3CC/29G) 29G X 1/2" 0.3 ML MISC, Use for sliding scale for breakthrough high blood sugar readings, Disp: 100 each, Rfl: 1   lactose free nutrition (BOOST) LIQD, Take 237 mLs by mouth daily with supper. (To supplement meals), Disp: , Rfl:    lidocaine (XYLOCAINE) 2 % solution, Use as directed 15 mLs in the mouth or throat as needed for mouth pain. (Patient taking differently: Use as directed 15 mLs in the mouth or throat daily as needed for mouth pain.), Disp: 100 mL, Rfl: 2   magnesium oxide (MAG-OX) 400 (241.3 Mg) MG tablet, Take 1 tablet (400 mg total) by mouth daily., Disp: , Rfl:    metoprolol succinate (TOPROL-XL) 25 MG 24 hr tablet, TAKE 1/2 TABLET EVERY DAY (Patient taking differently: Take 12.5 mg by mouth daily.), Disp: 45 tablet, Rfl: 3   mirabegron ER (MYRBETRIQ) 25 MG TB24 tablet, Take 1 tablet (25 mg total) by mouth daily.,  Disp: 10 tablet, Rfl: 0   nitrofurantoin, macrocrystal-monohydrate, (MACROBID) 100 MG capsule, Take 1 capsule (100 mg total) by mouth 2 (two) times daily for 5 days., Disp: 10 capsule,  Rfl: 0   ondansetron (ZOFRAN-ODT) 4 MG disintegrating tablet, Take 1 tablet (4 mg total) by mouth every 8 (eight) hours as needed for vomiting or nausea., Disp: 20 tablet, Rfl: 0   pantoprazole (PROTONIX) 40 MG tablet, Take 1 tablet (40 mg total) by mouth daily. (Patient taking differently: Take 40 mg by mouth 2 (two) times daily before a meal.), Disp: 30 tablet, Rfl: 1   polyethylene glycol (MIRALAX / GLYCOLAX) 17 g packet, Take 17 g by mouth 2 (two) times daily., Disp: 60 packet, Rfl: 0   predniSONE (DELTASONE) 10 MG tablet, Take 20 mg by mouth daily with breakfast., Disp: , Rfl:    simethicone (GAS-X) 80 MG chewable tablet, Chew 1 tablet (80 mg total) by mouth 4 (four) times daily as needed for flatulence., Disp: 100 tablet, Rfl: 2   spironolactone (ALDACTONE) 25 MG tablet, TAKE 1 TABLET EVERY DAY (Patient taking differently: Take 25 mg by mouth daily.), Disp: 90 tablet, Rfl: 3   tadalafil, PAH, (ADCIRCA) 20 MG tablet, Take 2 tablets (40 mg total) by mouth daily. (Patient taking differently: Take 40 mg by mouth at bedtime.), Disp: 60 tablet, Rfl: 11   TRESIBA FLEXTOUCH 100 UNIT/ML FlexTouch Pen, Inject 9-14 Units into the skin at bedtime., Disp: , Rfl:    TRUE METRIX BLOOD GLUCOSE TEST test strip, SMARTSIG:Via Meter, Disp: , Rfl:    vitamin C (ASCORBIC ACID) 500 MG tablet, Take 500 mg by mouth daily with lunch., Disp: , Rfl:    warfarin (COUMADIN) 5 MG tablet, TAKE 1 TO 1 AND 1/2 TABLETS DAILY AS DIRECTED BY COUMADIN CLINIC, Disp: 120 tablet, Rfl: 0  PHYSICAL EXAM: ECOG FS:1 - Symptomatic but completely ambulatory    Vitals:   05/15/22 1024  BP: (!) 134/53  Pulse: 68  Resp: 20  Temp: 98 F (36.7 C)  TempSrc: Oral  SpO2: 100%  Weight: 135 lb 8 oz (61.5 kg)   Physical Exam Vitals and nursing note reviewed.  Constitutional:      Appearance: She is well-developed. She is not ill-appearing or toxic-appearing.  HENT:     Head: Normocephalic and atraumatic.     Nose: Nose normal.   Eyes:     General: No scleral icterus.       Right eye: No discharge.        Left eye: No discharge.     Conjunctiva/sclera: Conjunctivae normal.  Neck:     Vascular: No JVD.  Cardiovascular:     Rate and Rhythm: Normal rate and regular rhythm.     Pulses: Normal pulses.          Dorsalis pedis pulses are 2+ on the right side and 2+ on the left side.     Heart sounds: Normal heart sounds.  Pulmonary:     Effort: Pulmonary effort is normal.     Breath sounds: Normal breath sounds.  Abdominal:     General: There is no distension.     Palpations: There is no mass.     Tenderness: There is no abdominal tenderness. There is no guarding or rebound.     Hernia: No hernia is present.  Musculoskeletal:        General: Normal range of motion.     Cervical back: Normal range of motion.     Right lower  leg: 2+ Pitting Edema present.     Left lower leg: 2+ Pitting Edema present.     Comments:  no palpable cords, compartments are soft in bilateral lower extremities. No calf tenderness.    Skin:    General: Skin is warm and dry.     Comments: Please see media below. Equal tactile temperature in bilateral lower extremities. No warmth overlying erythema.  Neurological:     Mental Status: She is oriented to person, place, and time.     GCS: GCS eye subscore is 4. GCS verbal subscore is 5. GCS motor subscore is 6.     Comments: Fluent speech, no facial droop.  Psychiatric:        Behavior: Behavior normal.         LABORATORY DATA: I have reviewed the data as listed    Latest Ref Rng & Units 05/15/2022   10:55 AM 05/12/2022   12:37 PM 05/05/2022   11:22 AM  CBC  WBC 4.0 - 10.5 K/uL 6.7  6.5  7.9   Hemoglobin 12.0 - 15.0 g/dL 8.3  8.3  9.2   Hematocrit 36.0 - 46.0 % 23.7  24.7  27.7   Platelets 150 - 400 K/uL 121  121  125         Latest Ref Rng & Units 05/15/2022   10:55 AM 05/12/2022   12:37 PM 05/05/2022   11:22 AM  CMP  Glucose 70 - 99 mg/dL 88  242  190   BUN 8 - 23 mg/dL  123  129  114   Creatinine 0.44 - 1.00 mg/dL 2.41  2.66  2.34   Sodium 135 - 145 mmol/L 133  131  132   Potassium 3.5 - 5.1 mmol/L 4.7  4.8  5.0   Chloride 98 - 111 mmol/L 105  104  104   CO2 22 - 32 mmol/L '20  17  20   ' Calcium 8.9 - 10.3 mg/dL 8.5  8.2  8.6   Total Protein 6.5 - 8.1 g/dL 5.9  5.8  5.6   Total Bilirubin 0.3 - 1.2 mg/dL 0.5  0.5  0.5   Alkaline Phos 38 - 126 U/L 71  70  78   AST 15 - 41 U/L 37  26  30   ALT 0 - 44 U/L '29  23  30        ' RADIOGRAPHIC STUDIES (from last 24 hours if applicable) I have personally reviewed the radiological images as listed and agreed with the findings in the report. No results found.     ASSESSMENT & PLAN: Patient is a 81 y.o. 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 Chryl Heck.  I have viewed most recent oncology note and lab work.   #) Leg swelling-patient is afebrile, nontoxic-appearing.  She is hemodynamically stable.  She had some improvement on the clindamycin in terms of a possible cellulitis infection.  Question if this is truly cellulitis versus vascular insufficiency.  She has no associated pain and no signs of life-threatening limb ischemia.  Will have patient get Doppler study to rule out DVT.  Per patient's request this will be scheduled on Thursday of this week as she has doctors appointments daily already this week.  Patient is already taking Lasix p.o. for edema.  She is also currently on prednisone which is likely contributing to the swelling.  Unfortunately patient has had worsening hemolysis and anemia when trying to taper off  prednisone so it is unsafe to stop steroid at this time.  Patient is scheduled to see cardiologist for ABI this week.  I discussed patient with oncologist who is agreeable with plan to stop antibiotic clindamycin at this time as is it puts her at high risk for a C. difficile infection and will have her return to clinic in x 3 days for  recheck.  Patient and daughter are agreeable with this plan as they were hoping to avoid hospital admission today.  Strict ED precautions discussed should symptoms worsen.  #) CKD stage IV-patient's kidney function is consistent with recent labs.  BUN 123, creatinine 2.41 and GFR of 19.  Patient has an appointment scheduled with nephrologist this week.  #) Nausea- none currently.  Nontender abdomen.  Patient given prescription for as needed Zofran for her request  #) Nosebleed-no active bleeding now and nosebleed was mild per patient's report.  INR is within her parameters at 2.1 today.  Patient will continue to follow with the Coumadin clinic.  Hemoglobin is stable at 8.3.  No indication for blood transfusion at this time.    #) 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- Next appointment with oncologist is 05/25/22.   Visit Diagnosis: 1. Bilateral leg edema   2. 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 ([place])   3. Autoimmune hemolytic anemia (HCC)   4. Bilateral lower extremity edema   5. Nausea without vomiting   6. Mechanical heart valve present      No orders of the defined types were placed in this encounter.   All questions were answered. The patient knows to call the clinic with any problems, questions or concerns. No barriers to learning was detected.  I have spent a total of 30 minutes minutes of face-to-face and non-face-to-face time, preparing to see the patient, obtaining and/or reviewing separately obtained history, performing a medically appropriate examination, counseling and educating the patient, ordering tests, documenting clinical information in the electronic health record, and care coordination (communications with other health care professionals or caregivers).    Thank you for allowing me to participate in the care of this patient.    Barrie Folk, PA-C Department of Hematology/Oncology [facility] at [facility] Phone: [phone]  [phone]    05/15/2022 5:16 PM

## 2022-05-15 NOTE — Progress Notes (Signed)
Pt has been notified and will stop the keflex and start the macrobid

## 2022-05-15 NOTE — Addendum Note (Signed)
Addended by: Jaquita Folds on: 05/15/2022 07:22 AM   Modules accepted: Orders

## 2022-05-16 ENCOUNTER — Ambulatory Visit (HOSPITAL_COMMUNITY): Payer: Medicare HMO

## 2022-05-16 DIAGNOSIS — I7782 Antineutrophilic cytoplasmic antibody (ANCA) vasculitis: Secondary | ICD-10-CM | POA: Diagnosis not present

## 2022-05-16 DIAGNOSIS — K529 Noninfective gastroenteritis and colitis, unspecified: Secondary | ICD-10-CM | POA: Diagnosis not present

## 2022-05-16 DIAGNOSIS — I130 Hypertensive heart and chronic kidney disease with heart failure and stage 1 through stage 4 chronic kidney disease, or unspecified chronic kidney disease: Secondary | ICD-10-CM | POA: Diagnosis not present

## 2022-05-16 DIAGNOSIS — E876 Hypokalemia: Secondary | ICD-10-CM | POA: Diagnosis not present

## 2022-05-16 DIAGNOSIS — A0472 Enterocolitis due to Clostridium difficile, not specified as recurrent: Secondary | ICD-10-CM | POA: Diagnosis not present

## 2022-05-16 DIAGNOSIS — N184 Chronic kidney disease, stage 4 (severe): Secondary | ICD-10-CM | POA: Diagnosis not present

## 2022-05-16 DIAGNOSIS — E1122 Type 2 diabetes mellitus with diabetic chronic kidney disease: Secondary | ICD-10-CM | POA: Diagnosis not present

## 2022-05-16 DIAGNOSIS — I5032 Chronic diastolic (congestive) heart failure: Secondary | ICD-10-CM | POA: Diagnosis not present

## 2022-05-16 DIAGNOSIS — N3080 Other cystitis without hematuria: Secondary | ICD-10-CM | POA: Diagnosis not present

## 2022-05-17 ENCOUNTER — Other Ambulatory Visit (HOSPITAL_COMMUNITY): Payer: Self-pay | Admitting: Cardiology

## 2022-05-17 DIAGNOSIS — E1122 Type 2 diabetes mellitus with diabetic chronic kidney disease: Secondary | ICD-10-CM | POA: Diagnosis not present

## 2022-05-17 DIAGNOSIS — R197 Diarrhea, unspecified: Secondary | ICD-10-CM | POA: Diagnosis not present

## 2022-05-17 DIAGNOSIS — N184 Chronic kidney disease, stage 4 (severe): Secondary | ICD-10-CM | POA: Diagnosis not present

## 2022-05-17 DIAGNOSIS — A0472 Enterocolitis due to Clostridium difficile, not specified as recurrent: Secondary | ICD-10-CM | POA: Diagnosis not present

## 2022-05-17 DIAGNOSIS — I739 Peripheral vascular disease, unspecified: Secondary | ICD-10-CM | POA: Diagnosis not present

## 2022-05-17 DIAGNOSIS — N3000 Acute cystitis without hematuria: Secondary | ICD-10-CM | POA: Diagnosis not present

## 2022-05-18 ENCOUNTER — Other Ambulatory Visit: Payer: Self-pay

## 2022-05-18 ENCOUNTER — Inpatient Hospital Stay: Payer: Medicare HMO

## 2022-05-18 ENCOUNTER — Ambulatory Visit (HOSPITAL_COMMUNITY)
Admission: RE | Admit: 2022-05-18 | Discharge: 2022-05-18 | Disposition: A | Discharge status: Home / Self Care | Payer: Medicare HMO | Source: Ambulatory Visit | Attending: Physician Assistant | Admitting: Physician Assistant

## 2022-05-18 ENCOUNTER — Inpatient Hospital Stay (HOSPITAL_BASED_OUTPATIENT_CLINIC_OR_DEPARTMENT_OTHER): Payer: Medicare HMO | Admitting: Physician Assistant

## 2022-05-18 ENCOUNTER — Ambulatory Visit (INDEPENDENT_AMBULATORY_CARE_PROVIDER_SITE_OTHER): Payer: Medicare HMO | Admitting: Cardiology

## 2022-05-18 VITALS — BP 150/54 | HR 74 | Temp 97.9°F | Resp 14 | Wt 135.8 lb

## 2022-05-18 DIAGNOSIS — M8580 Other specified disorders of bone density and structure, unspecified site: Secondary | ICD-10-CM | POA: Diagnosis not present

## 2022-05-18 DIAGNOSIS — Z95828 Presence of other vascular implants and grafts: Secondary | ICD-10-CM | POA: Insufficient documentation

## 2022-05-18 DIAGNOSIS — R600 Localized edema: Secondary | ICD-10-CM | POA: Diagnosis not present

## 2022-05-18 DIAGNOSIS — Z952 Presence of prosthetic heart valve: Secondary | ICD-10-CM

## 2022-05-18 DIAGNOSIS — L03116 Cellulitis of left lower limb: Secondary | ICD-10-CM | POA: Diagnosis not present

## 2022-05-18 DIAGNOSIS — I7782 Antineutrophilic cytoplasmic antibody (ANCA) vasculitis: Secondary | ICD-10-CM | POA: Diagnosis not present

## 2022-05-18 DIAGNOSIS — D594 Other nonautoimmune hemolytic anemias: Secondary | ICD-10-CM | POA: Diagnosis not present

## 2022-05-18 DIAGNOSIS — L03115 Cellulitis of right lower limb: Secondary | ICD-10-CM | POA: Diagnosis not present

## 2022-05-18 DIAGNOSIS — I739 Peripheral vascular disease, unspecified: Secondary | ICD-10-CM | POA: Insufficient documentation

## 2022-05-18 DIAGNOSIS — Z7901 Long term (current) use of anticoagulants: Secondary | ICD-10-CM

## 2022-05-18 DIAGNOSIS — M7989 Other specified soft tissue disorders: Secondary | ICD-10-CM | POA: Diagnosis not present

## 2022-05-18 DIAGNOSIS — Z7952 Long term (current) use of systemic steroids: Secondary | ICD-10-CM | POA: Diagnosis not present

## 2022-05-18 DIAGNOSIS — N184 Chronic kidney disease, stage 4 (severe): Secondary | ICD-10-CM | POA: Diagnosis not present

## 2022-05-18 DIAGNOSIS — D591 Autoimmune hemolytic anemia, unspecified: Secondary | ICD-10-CM

## 2022-05-18 LAB — CBC WITH DIFFERENTIAL/PLATELET
Abs Immature Granulocytes: 0.07 10*3/uL (ref 0.00–0.07)
Basophils Absolute: 0 10*3/uL (ref 0.0–0.1)
Basophils Relative: 0 %
Eosinophils Absolute: 0.1 10*3/uL (ref 0.0–0.5)
Eosinophils Relative: 1 %
HCT: 22.8 % — ABNORMAL LOW (ref 36.0–46.0)
Hemoglobin: 7.6 g/dL — ABNORMAL LOW (ref 12.0–15.0)
Immature Granulocytes: 1 %
Lymphocytes Relative: 4 %
Lymphs Abs: 0.4 10*3/uL — ABNORMAL LOW (ref 0.7–4.0)
MCH: 30.5 pg (ref 26.0–34.0)
MCHC: 33.3 g/dL (ref 30.0–36.0)
MCV: 91.6 fL (ref 80.0–100.0)
Monocytes Absolute: 0.4 10*3/uL (ref 0.1–1.0)
Monocytes Relative: 4 %
Neutro Abs: 7.1 10*3/uL (ref 1.7–7.7)
Neutrophils Relative %: 90 %
Platelets: 126 10*3/uL — ABNORMAL LOW (ref 150–400)
RBC: 2.49 MIL/uL — ABNORMAL LOW (ref 3.87–5.11)
RDW: 18.8 % — ABNORMAL HIGH (ref 11.5–15.5)
WBC: 7.9 10*3/uL (ref 4.0–10.5)
nRBC: 0 % (ref 0.0–0.2)

## 2022-05-18 LAB — RETIC PANEL
Immature Retic Fract: 13.1 % (ref 2.3–15.9)
RBC.: 2.52 MIL/uL — ABNORMAL LOW (ref 3.87–5.11)
Retic Count, Absolute: 131.3 10*3/uL (ref 19.0–186.0)
Retic Ct Pct: 5.2 % — ABNORMAL HIGH (ref 0.4–3.1)
Reticulocyte Hemoglobin: 35.1 pg (ref >27.9)

## 2022-05-18 LAB — CMP (CANCER CENTER ONLY)
ALT: 25 U/L (ref 0–44)
AST: 29 U/L (ref 15–41)
Albumin: 3.5 g/dL (ref 3.5–5.0)
Alkaline Phosphatase: 68 U/L (ref 38–126)
Anion gap: 9 (ref 5–15)
BUN: 120 mg/dL — ABNORMAL HIGH (ref 8–23)
CO2: 20 mmol/L — ABNORMAL LOW (ref 22–32)
Calcium: 8.5 mg/dL — ABNORMAL LOW (ref 8.9–10.3)
Chloride: 102 mmol/L (ref 98–111)
Creatinine: 2.51 mg/dL — ABNORMAL HIGH (ref 0.44–1.00)
GFR, Estimated: 19 mL/min — ABNORMAL LOW (ref >60)
Glucose, Bld: 181 mg/dL — ABNORMAL HIGH (ref 70–99)
Potassium: 4.7 mmol/L (ref 3.5–5.1)
Sodium: 131 mmol/L — ABNORMAL LOW (ref 135–145)
Total Bilirubin: 0.5 mg/dL (ref 0.3–1.2)
Total Protein: 5.6 g/dL — ABNORMAL LOW (ref 6.5–8.1)

## 2022-05-18 LAB — SAMPLE TO BLOOD BANK

## 2022-05-18 LAB — PREPARE RBC (CROSSMATCH)

## 2022-05-18 LAB — PROTIME-INR
INR: 3 — ABNORMAL HIGH (ref 0.8–1.2)
Prothrombin Time: 30.7 seconds — ABNORMAL HIGH (ref 11.4–15.2)

## 2022-05-18 MED ORDER — SODIUM CHLORIDE 0.9% IV SOLUTION
250.0000 mL | Freq: Once | INTRAVENOUS | Status: AC
  Administered 2022-05-18: 250 mL via INTRAVENOUS

## 2022-05-18 MED ORDER — PREDNISONE 10 MG PO TABS: 20.0000 mg | ORAL_TABLET | Freq: Every day | ORAL | 0 refills | Status: DC

## 2022-05-18 NOTE — Progress Notes (Signed)
Symptom Management Consult note [place]    Patient Care Team: [name], [name], MD as PCP - General (Internal Medicine) [name], MD as PCP - Advanced Heart Failure (Cardiology) Lorretta Harp, MD as PCP - Cardiology (Cardiology) Arta Silence, MD as Consulting Physician (Gastroenterology) [name], MD as Consulting Physician (Cardiology) Marzetta Board, DPM as Consulting Physician (Podiatry) Gavin Pound, MD as Consulting Physician (Rheumatology) Jon Billings, RN as [place] Management Corliss Parish, MD as Consulting Physician (Nephrology) Mcarthur Rossetti, MD as Consulting Physician (Orthopedic Surgery)    Name of the patient: [name]  [id number]  [birth year]   Date of visit: 05/18/2022    Chief complaint/ Reason for visit- leg swelling, weekly labs  Oncology History   No history exists.    Current Therapy: Prednisone 20 mg daily    Interval history- [name] is a 81 y.o. with oncologic history as above presenting to [facility] today with chief complaint of bilateral leg swelling and weekly labs.  She is accompanied today by her son who provides additional history.  Patient was originally scheduled for the appointment today for her weekly labs to monitor her anemia.  I saw her in clinic x 4 days ago for bilateral lower extremity edema with concern for possible cellulitis.  Patient has stopped taking the antibiotics and the redness is about the same.  She does report mild pain in her legs that she describes as a heaviness.  She states that pain is constant and rates it 3 out of 10 in severity.  She did wrap her legs for approximately 24 hours on Tuesday and when she unwrapped then the swelling had greatly improved.  She has not since wrapped them and swelling is worsened.  She denies any fall or injury to her legs.  She is compliant with her medications.  She saw her PCP yesterday.  She is  scheduled to have ankle brachial indices tomorrow and to see her cardiologist Dr. [name] on Monday of next week. She admits to mild fatigue today. She denies any fever, chills, headache, palpitations, lightheadedness, abnormal bleeding.   ROS  All other systems are reviewed and are negative for acute change except as noted in the HPI.    Allergies  Allergen Reactions   Flagyl [Metronidazole] Rash and Other (See Comments)    ALL-OVER BODY RASH   Augmentin [Amoxicillin-Pot Clavulanate] Diarrhea and Nausea And Vomiting   Ciprofloxacin Nausea Only and Other (See Comments)    Stomach ache   Coreg [Carvedilol] Other (See Comments)    Terrible cramping in the feet and had a lot of bowel movements, but not diarrhea   Diflucan [Fluconazole] Diarrhea   Losartan Swelling and Other (See Comments)    Patient doesn't recall site of swelling   Privigen [Immune Globulin (Human)] Other (See Comments)    Severe/excruciating pain   Sulfamethoxazole-Trimethoprim Nausea Only and Other (See Comments)    Stomach upset   Verapamil Hives   Zetia [Ezetimibe] Other (See Comments)    Reaction not recalled   Zocor [Simvastatin - High Dose] Other (See Comments)    Reaction not recalled   Gatifloxacin Rash and Other (See Comments)    Redness to skin around eye     Past Medical History:  Diagnosis Date   Aortic atherosclerosis ([place]) 01/23/2018   Atrial fibrillation ([place])    Benign positional vertigo 10/06/2011   Carotid artery disease ([place])    Chemotherapy induced  neutropenia ([place]) 07/21/2019   Cholelithiasis 01/23/2018   Cholelithiasis 01/23/2018   Chronic anticoagulation    Coronary artery disease    status post coronary artery bypass grafting times 07/10/2004   Diabetes mellitus    Hypercholesteremia    Hypertension    Mechanical heart valve present    H. aortic valve replacement at the time of bypass surgery October 2005   Moderate to severe pulmonary hypertension ([place])    Peripheral arterial  disease ([place])    history of left common iliac artery PTA and stenting for a chronic total occlusion 08/26/01     Past Surgical History:  Procedure Laterality Date   anterior repair  2009   AORTIC VALVE REPLACEMENT  2005   Dr. Cyndia Bent   CARDIAC CATHETERIZATION  11/10/2004   40% right common illiac, 70% in stent restenosis of distal left common illiac,    CARDIAC CATHETERIZATION  05/18/2004   LAD 50-70% midstenosis, RCA dominant w/50% stenosis, 50% Right common Illiac artery ostial stenosis, 90% in stent restenosis within midportion of left common illiac stent   Carotid Duplex  03/12/2012   RSA-elev. velocities suggestive of a 50-69% diameter reduction, Right&Left Bulb/Prox ICA-mild-mod.fibrous plaqueelevating Velocities abnormal study.   CHOLECYSTECTOMY N/A 03/01/2018   Procedure: LAPAROSCOPIC CHOLECYSTECTOMY;  Surgeon: Judeth Horn, MD;  Location: [facility];  Service: General;  Laterality: N/A;   COLONOSCOPY WITH PROPOFOL N/A 01/22/2018   Procedure: COLONOSCOPY WITH PROPOFOL;  Surgeon: Wilford Corner, MD;  Location: [place];  Service: Endoscopy;  Laterality: N/A;   ESOPHAGOGASTRODUODENOSCOPY (EGD) WITH PROPOFOL N/A 01/22/2018   Procedure: ESOPHAGOGASTRODUODENOSCOPY (EGD) WITH PROPOFOL;  Surgeon: Wilford Corner, MD;  Location: [place];  Service: Endoscopy;  Laterality: N/A;   ESOPHAGOGASTRODUODENOSCOPY (EGD) WITH PROPOFOL N/A 11/21/2018   Procedure: ESOPHAGOGASTRODUODENOSCOPY (EGD) WITH PROPOFOL;  Surgeon: [name], MD;  Location: [place];  Service: Gastroenterology;  Laterality: N/A;   ESOPHAGOGASTRODUODENOSCOPY (EGD) WITH PROPOFOL Left 02/27/2019   Procedure: ESOPHAGOGASTRODUODENOSCOPY (EGD) WITH PROPOFOL;  Surgeon: Arta Silence, MD;  Location[facility] END[facility]COPY;  Service: Endoscopy;  Laterality: Left;   GIVENS CAPSULE STUDY N/A 11/21/2018   Procedure: GIVENS CAPSULE STUDY;  Surgeon: [name], MD;  Location: [place];  Service: Gastroenterology;  Laterality: N/A;  To be  deployed during EGD   Lower Ext. Duplex  03/12/2012   Right Proximal CIA- vessel narrowing w/elevated velocities 0-49% diameter reduction. Right SFA-mild mixed density plaque throughout vessel.   NM MYOCAR PERF WALL MOTION  05/19/2010   protocol: Persantine, post stress EF 65%, negative for ischemia, low risk scan   RIGHT HEART CATH N/A 06/27/2018   Procedure: RIGHT HEART CATH;  Surgeon: [name], MD;  Location: [facility];  Service: Cardiovascular;  Laterality: N/A;   TOTAL ABDOMINAL HYSTERECTOMY W/ BILATERAL SALPINGOOPHORECTOMY  1989   TRANSTHORACIC ECHOCARDIOGRAM  08/29/2012   Moderately calcified annulus of mitral valve, moderate regurg. of both mitral valve and tricuspid valve.     Social History   Socioeconomic History   Marital status: Widowed    Spouse name: Not on file   Number of children: Not on file   Years of education: Not on file   Highest education level: Not on file  Occupational History   Occupation: Retired  Tobacco Use   Smoking status: Former    Packs/day: 1.00    Years: 30.00    Total pack years: 30.00    Types: Cigarettes   Smokeless tobacco: Never   Tobacco comments:    quit smoking 2005  Vaping Use  Vaping Use: Never used  Substance and Sexual Activity   Alcohol use: No    Alcohol/week: 0.0 standard drinks of alcohol   Drug use: No   Sexual activity: Not Currently    Birth control/protection: None  Other Topics Concern   Not on file  Social History Narrative   Not on file   Social Determinants of Health   Financial Resource Strain: Low Risk  (02/28/2022)   Overall Financial Resource Strain (CARDIA)    Difficulty of Paying Living Expenses: Not hard at all  Food Insecurity: No Food Insecurity (01/13/2022)   Hunger Vital Sign    Worried About Running Out of Food in the Last Year: Never true    Ran Out of Food in the Last Year: Never true  Transportation Needs: No Transportation Needs (05/12/2022)   PRAPARE - Armed forces logistics/support/administrative officer (Medical): No    Lack of Transportation (Non-Medical): No  Physical Activity: Inactive (02/28/2022)   Exercise Vital Sign    Days of Exercise per Week: 0 days    Minutes of Exercise per Session: 0 min  Stress: No Stress Concern Present (02/28/2022)   [place]    Feeling of Stress : Only a little  Social Connections: Socially Isolated (11/25/2019)   Social Connection and Isolation Panel [NHANES]    Frequency of Communication with Friends and Family: More than three times a week    Frequency of Social Gatherings with Friends and Family: Once a week    Attends Religious Services: Never    Marine scientist or Organizations: No    Attends Archivist Meetings: Never    Marital Status: Widowed  Intimate Partner Violence: Not At Risk (02/28/2022)   Humiliation, Afraid, Rape, and Kick questionnaire    Fear of Current or Ex-Partner: No    Emotionally Abused: No    Physically Abused: No    Sexually Abused: No    Family History  Problem Relation Age of Onset   Heart disease Father    Hypertension Father    Hyperlipidemia Father    Breast cancer Neg Hx      Current Outpatient Medications:    acetaminophen (TYLENOL) 325 MG tablet, Take 650 mg by mouth every 6 (six) hours as needed for mild pain or fever., Disp: , Rfl:    alendronate (FOSAMAX) 70 MG tablet, Take 70 mg by mouth every Saturday., Disp: , Rfl:    allopurinol (ZYLOPRIM) 100 MG tablet, Take 100 mg by mouth daily., Disp: , Rfl:    ALPRAZolam (XANAX) 0.5 MG tablet, Take 0.25-0.5 mg by mouth at bedtime., Disp: , Rfl:    amLODipine (NORVASC) 10 MG tablet, TAKE 1 TABLET EVERY DAY, Disp: 90 tablet, Rfl: 3   aspirin EC 81 MG tablet, Take 81 mg by mouth daily. Swallow whole., Disp: , Rfl:    atorvastatin (LIPITOR) 80 MG tablet, Take 1 tablet (80 mg total) by mouth daily at 6 PM., Disp: , Rfl:    Blood Glucose Monitoring Suppl (TRUE  METRIX METER) w/Device KIT, , Disp: , Rfl:    Cholecalciferol (VITAMIN D3) 50 MCG (2000 UT) capsule, Take 2,000 Units by mouth daily with lunch., Disp: , Rfl:    cloNIDine (CATAPRES) 0.2 MG tablet, TAKE 1 TABLET TWICE DAILY (Patient taking differently: Take 0.2 mg by mouth 2 (two) times daily.), Disp: 180 tablet, Rfl: 3   Cyanocobalamin (VITAMIN B12) 1000 MCG TBCR, Take 1,000 mcg by mouth  daily with lunch., Disp: , Rfl:    estradiol (ESTRACE) 0.1 MG/GM vaginal cream, Place 0.5g nightly for two weeks then twice a week after (Patient taking differently: Place 1 Applicatorful vaginally 3 (three) times a week.), Disp: 30 g, Rfl: 11   famotidine (PEPCID) 20 MG tablet, Take 1 tablet (20 mg total) by mouth 2 (two) times daily., Disp: 60 tablet, Rfl: 3   folic acid (FOLVITE) 1 MG tablet, Take 1 mg by mouth daily., Disp: , Rfl:    furosemide (LASIX) 40 MG tablet, TAKE 2 TABLETS (80 MG TOTAL) IN MORNING AND 1 AND 1/2 TABLETS (60 MG TOTAL) IN EVENING. CHANGE IN DOSAGE. (Patient taking differently: Take 40-80 mg by mouth See admin instructions. Take 80 mg by mouth in the morning and 40 mg in the afternoon), Disp: 315 tablet, Rfl: 0   glipiZIDE (GLUCOTROL) 5 MG tablet, Take 1 tablet (5 mg total) by mouth daily., Disp: 30 tablet, Rfl: 0   glucose blood (TRUE METRIX BLOOD GLUCOSE TEST) test strip, Use 1-2 times daily, Disp: , Rfl:    hydrALAZINE (APRESOLINE) 100 MG tablet, TAKE 1 TABLET THREE TIMES DAILY (Patient taking differently: Take 100 mg by mouth 3 (three) times daily.), Disp: 270 tablet, Rfl: 3   insulin aspart (NOVOLOG) 100 UNIT/ML injection, Take 2 units for blood sugar 200-250, 4 units for blood sugar 252-300, 6 units for blood sugar 301-350, 8 units for blood sugar greater than 350 and call MD. (Patient taking differently: Inject 2-8 Units into the skin See admin instructions. Inject 2-8 units into the skin three times a day with meals, PER SLIDING SCALE:  2 units for blood sugar 200-250, 4 units for blood  sugar 252-300, 6 units for blood sugar 301-350, 8 units for blood sugar greater than 350 and call MD.), Disp: 10 mL, Rfl: 11   Insulin Syringe-Needle U-100 (B-D INS SYR ULTRAFINE .3CC/29G) 29G X 1/2" 0.3 ML MISC, Use for sliding scale for breakthrough high blood sugar readings, Disp: 100 each, Rfl: 1   lactose free nutrition (BOOST) LIQD, Take 237 mLs by mouth daily with supper. (To supplement meals), Disp: , Rfl:    lidocaine (XYLOCAINE) 2 % solution, Use as directed 15 mLs in the mouth or throat as needed for mouth pain. (Patient taking differently: Use as directed 15 mLs in the mouth or throat daily as needed for mouth pain.), Disp: 100 mL, Rfl: 2   magnesium oxide (MAG-OX) 400 (241.3 Mg) MG tablet, Take 1 tablet (400 mg total) by mouth daily., Disp: , Rfl:    metoprolol succinate (TOPROL-XL) 25 MG 24 hr tablet, TAKE 1/2 TABLET EVERY DAY (Patient taking differently: Take 12.5 mg by mouth daily.), Disp: 45 tablet, Rfl: 3   mirabegron ER (MYRBETRIQ) 25 MG TB24 tablet, Take 1 tablet (25 mg total) by mouth daily., Disp: 10 tablet, Rfl: 0   nitrofurantoin, macrocrystal-monohydrate, (MACROBID) 100 MG capsule, Take 1 capsule (100 mg total) by mouth 2 (two) times daily for 5 days., Disp: 10 capsule, Rfl: 0   nystatin-alum & mag hydroxide-simeth-diphenhydrAMINE, Swish 5 mLs by mouth 3 times daily then spit, Disp: 150 mL, Rfl: 0   ondansetron (ZOFRAN-ODT) 4 MG disintegrating tablet, Take 1 tablet (4 mg total) by mouth every 8 (eight) hours as needed for vomiting or nausea., Disp: 20 tablet, Rfl: 0   pantoprazole (PROTONIX) 40 MG tablet, Take 1 tablet (40 mg total) by mouth daily. (Patient taking differently: Take 40 mg by mouth 2 (two) times daily before a meal.), Disp:  30 tablet, Rfl: 1   polyethylene glycol (MIRALAX / GLYCOLAX) 17 g packet, Take 17 g by mouth 2 (two) times daily., Disp: 60 packet, Rfl: 0   predniSONE (DELTASONE) 10 MG tablet, Take 2 tablets (20 mg total) by mouth daily with breakfast., Disp:  60 tablet, Rfl: 0   simethicone (GAS-X) 80 MG chewable tablet, Chew 1 tablet (80 mg total) by mouth 4 (four) times daily as needed for flatulence., Disp: 100 tablet, Rfl: 2   spironolactone (ALDACTONE) 25 MG tablet, TAKE 1 TABLET EVERY DAY, Disp: 90 tablet, Rfl: 3   tadalafil, PAH, (ADCIRCA) 20 MG tablet, Take 2 tablets (40 mg total) by mouth daily. (Patient taking differently: Take 40 mg by mouth at bedtime.), Disp: 60 tablet, Rfl: 11   TRESIBA FLEXTOUCH 100 UNIT/ML FlexTouch Pen, Inject 9-14 Units into the skin at bedtime., Disp: , Rfl:    TRUE METRIX BLOOD GLUCOSE TEST test strip, SMARTSIG:Via Meter, Disp: , Rfl:    vitamin C (ASCORBIC ACID) 500 MG tablet, Take 500 mg by mouth daily with lunch., Disp: , Rfl:    warfarin (COUMADIN) 5 MG tablet, TAKE 1 TO 1 AND 1/2 TABLETS DAILY AS DIRECTED BY COUMADIN CLINIC, Disp: 120 tablet, Rfl: 0  PHYSICAL EXAM: ECOG FS:2 - Symptomatic, <50% confined to bed    Vitals:   05/18/22 1010 05/18/22 1148  BP: (!) 147/54 (!) 150/54  Pulse: 68 74  Resp: 16 14  Temp: 97.8 F (36.6 C) 97.9 F (36.6 C)  TempSrc: Oral Oral  SpO2: 100% 99%  Weight: 135 lb 12.8 oz (61.6 kg)    Physical Exam Vitals and nursing note reviewed.  Constitutional:      Appearance: She is well-developed. She is not ill-appearing or toxic-appearing.  HENT:     Head: Normocephalic and atraumatic.     Nose: Nose normal.  Eyes:     General: No scleral icterus.       Right eye: No discharge.        Left eye: No discharge.     Conjunctiva/sclera: Conjunctivae normal.  Neck:     Vascular: No JVD.  Cardiovascular:     Rate and Rhythm: Normal rate and regular rhythm.     Heart sounds: Murmur heard.  Pulmonary:     Effort: Pulmonary effort is normal.     Breath sounds: Normal breath sounds.  Abdominal:     General: There is no distension.  Musculoskeletal:        General: Normal range of motion.     Cervical back: Normal range of motion.     Right lower leg: 2+ Pitting Edema  present.     Left lower leg: 2+ Pitting Edema present.  Skin:    General: Skin is warm and dry.     Findings: Bruising (On hands from recent blood draws) present.     Comments:  Equal tactile temperature in bilateral lower extremities No pedal ulcerations. Erythema on bilateral shins.  No overlying warmth.  No gross abscess  Neurological:     Mental Status: She is oriented to person, place, and time.     GCS: GCS eye subscore is 4. GCS verbal subscore is 5. GCS motor subscore is 6.     Comments: Fluent speech, no facial droop.  Psychiatric:        Behavior: Behavior normal.        LABORATORY DATA: I have reviewed the data as listed    Latest Ref Rng & Units 05/18/2022  9:42 AM 05/15/2022   10:55 AM 05/12/2022   12:37 PM  CBC  WBC 4.0 - 10.5 K/uL 7.9  6.7  6.5   Hemoglobin 12.0 - 15.0 g/dL 7.6  8.3  8.3   Hematocrit 36.0 - 46.0 % 22.8  23.7  24.7   Platelets 150 - 400 K/uL 126  121  121         Latest Ref Rng & Units 05/18/2022    9:42 AM 05/15/2022   10:55 AM 05/12/2022   12:37 PM  CMP  Glucose 70 - 99 mg/dL 181  88  242   BUN 8 - 23 mg/dL 120  123  129   Creatinine 0.44 - 1.00 mg/dL 2.51  2.41  2.66   Sodium 135 - 145 mmol/L 131  133  131   Potassium 3.5 - 5.1 mmol/L 4.7  4.7  4.8   Chloride 98 - 111 mmol/L 102  105  104   CO2 22 - 32 mmol/L _0 Calcium 8.9 - 10.3 mg/dL 8.5  8.5  8.2   Total Protein 6.5 - 8.1 g/dL 5.6  5.9  5.8   Total Bilirubin 0.3 - 1.2 mg/dL 0.5  0.5  0.5   Alkaline Phos 38 - 126 U/L 68  71  70   AST 15 - 41 U/L 29  37  26   ALT 0 - 44 U/L _1 RADIOGRAPHIC STUDIES (from last 24 hours if applicable) I have personally reviewed the radiological images as listed and agreed with the findings in the report. VAS [place] LOWER EXTREMITY VENOUS (DVT)  Result Date: 05/18/2022  Lower Venous DVT Study Patient Name:  VERINA GALENO  Date of Exam:   05/18/2022 Medical Rec #: [id number]       Accession #:    [id number] Date of Birth:  [birth year]       Patient Gender: F Patient Age:   25 years Exam [facility] Procedure:      VAS [place] LOWER EXTREMITY VENOUS (DVT) Referring Phys: Sherol Dade --------------------------------------------------------------------------------  Indications: Edema.  Risk Factors: Hemolytic anemia & vasculitis. Anticoagulation: Coumadin. Limitations: Poor ultrasound/tissue interface and calcific shadowing. Comparison Study: previous exam of LLE on 03/22/2019 was negative for DVT Performing Technologist: Rogelia Rohrer RVT, RDMS  Examination Guidelines: A complete evaluation includes B-mode imaging, spectral Doppler, color Doppler, and power Doppler as needed of all accessible portions of each vessel. Bilateral testing is considered an integral part of a complete examination. Limited examinations for reoccurring indications may be performed as noted. The reflux portion of the exam is performed with the patient in reverse Trendelenburg.  +--------+---------------+---------+-----------+----------+--------------------+ RIGHT   CompressibilityPhasicitySpontaneityPropertiesThrombus Aging       +--------+---------------+---------+-----------+----------+--------------------+ CFV     Full           No       Yes                                       +--------+---------------+---------+-----------+----------+--------------------+ SFJ     Full                                                              +--------+---------------+---------+-----------+----------+--------------------+  FV Prox Full           No       Yes                                       +--------+---------------+---------+-----------+----------+--------------------+ FV Mid  Full           No       Yes                                       +--------+---------------+---------+-----------+----------+--------------------+ FV      Full           No       Yes                                       Distal                                                                     +--------+---------------+---------+-----------+----------+--------------------+ PFV                    No       Yes                  patent by                                                                 color/doppler        +--------+---------------+---------+-----------+----------+--------------------+ POP     Full           No       Yes                                       +--------+---------------+---------+-----------+----------+--------------------+ PTV     Full                                                              +--------+---------------+---------+-----------+----------+--------------------+ PERO    Full                                                              +--------+---------------+---------+-----------+----------+--------------------+ Pulsatile doppler waveforms noted to RLE  +---------+---------------+---------+-----------+----------+-------------------+ LEFT     CompressibilityPhasicitySpontaneityPropertiesThrombus Aging      +---------+---------------+---------+-----------+----------+-------------------+ CFV      Full  No       Yes                                      +---------+---------------+---------+-----------+----------+-------------------+ SFJ      Full                                                             +---------+---------------+---------+-----------+----------+-------------------+ FV Prox  Full           No       Yes                                      +---------+---------------+---------+-----------+----------+-------------------+ FV Mid   Full           No       Yes                                      +---------+---------------+---------+-----------+----------+-------------------+ FV DistalFull           No       Yes                                       +---------+---------------+---------+-----------+----------+-------------------+ PFV      Full                                                             +---------+---------------+---------+-----------+----------+-------------------+ POP      Full           No       Yes                                      +---------+---------------+---------+-----------+----------+-------------------+ PTV      Full                                                             +---------+---------------+---------+-----------+----------+-------------------+ PERO                                                  Not well visualized +---------+---------------+---------+-----------+----------+-------------------+ Pulsatile doppler waveforms noted to LLE    Summary: BILATERAL: - No evidence of deep vein thrombosis seen in the lower extremities, bilaterally. -No evidence of popliteal cyst, bilaterally. -Diffuse subcutaneous edema, bilaterally.   *See table(s) above for measurements and observations.    Preliminary  ASSESSMENT & PLAN: Patient is a 81 y.o. 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 Chryl Heck.  I have viewed most recent oncology note and lab work.   #)Bilateral lower extremity edema -DVT study is negative. Erythema is similar to last clinic visit. She has taken 5 days of antibiotics and has no fever making infection/cellulitis less likely.  -Swelling improved after wrapping her legs as she had been advised in the past by vascular specialist per patient. Encouraged her to continue to do this. Prednisone is likely contributing to edema however patient has failed previous attempts to discontinue in the past because of severe anemia. -She has known history of PAD seen on dopplers 07/19. She has no pedal ulcerations and very mild symptoms of claudication currently. Will defer to cardiology for further evaluation and  management. She is schedule for ABI tomorrow and to see Dr. [name] 8/14. -Strict ED precautions discussed should symptoms worsen.    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 Patient given 1 unit of blood today in clinic. -Next appointment with oncologist is 05/25/22. Refill for prednisone sent to patient's pharmacy per her request.   Visit Diagnosis: 1. 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 Bilateral lower extremity edema      Orders Placed This Encounter  Procedures   Care order/instruction    Transfuse Parameters    Standing Status:   Future    Number of Occurrences:   1    Standing Expiration Date:   05/18/2023   Type and screen         Standing Status:   Future    Number of Occurrences:   1    Standing Expiration Date:   05/19/2023   Prepare RBC (crossmatch)    Standing Status:   Standing    Number of Occurrences:   1    Order Specific Question:   # of Units    Answer:   1 unit    Order Specific Question:   Transfusion Indications    Answer:   Symptomatic Anemia    Order Specific Question:   Number of Units to Keep Ahead    Answer:   NO units ahead    Order Specific Question:   If emergent release call blood bank    Answer:   Not emergent release    All questions were answered. The patient knows to call the clinic with any problems, questions or concerns. No barriers to learning was detected.  I have spent a total of 30 minutes minutes of face-to-face and non-face-to-face time, preparing to see the patient, obtaining and/or reviewing separately obtained history, performing a medically appropriate examination, counseling and educating the patient, ordering tests, documenting clinical information in the electronic health record, and care coordination (communications with other health care professionals or caregivers).    Thank you for allowing me to participate in the care of this  patient.    Barrie Folk, PA-C Department of Hematology/Oncology [facility] at [facility] Phone: [phone]  [phone]    05/18/2022 12:58 PM

## 2022-05-18 NOTE — Progress Notes (Signed)
BLE venous duplex has been completed.  Preliminary results given to [facility], PA-C.   Results can be found under chart review under CV PROC. 05/18/2022 9:40 AM Zunairah Devers RVT, RDMS

## 2022-05-19 ENCOUNTER — Other Ambulatory Visit: Payer: Self-pay | Admitting: *Deleted

## 2022-05-19 ENCOUNTER — Encounter (HOSPITAL_COMMUNITY): Payer: Medicare HMO

## 2022-05-19 ENCOUNTER — Ambulatory Visit (HOSPITAL_BASED_OUTPATIENT_CLINIC_OR_DEPARTMENT_OTHER)
Admission: RE | Admit: 2022-05-19 | Discharge: 2022-05-19 | Disposition: A | Discharge status: Home / Self Care | Payer: Medicare HMO | Source: Ambulatory Visit | Attending: Cardiovascular Disease | Admitting: Cardiovascular Disease

## 2022-05-19 DIAGNOSIS — D591 Autoimmune hemolytic anemia, unspecified: Secondary | ICD-10-CM

## 2022-05-19 DIAGNOSIS — I739 Peripheral vascular disease, unspecified: Secondary | ICD-10-CM

## 2022-05-19 DIAGNOSIS — D594 Other nonautoimmune hemolytic anemias: Secondary | ICD-10-CM

## 2022-05-19 DIAGNOSIS — D6489 Other specified anemias: Secondary | ICD-10-CM

## 2022-05-19 DIAGNOSIS — R197 Diarrhea, unspecified: Secondary | ICD-10-CM | POA: Diagnosis not present

## 2022-05-19 DIAGNOSIS — R600 Localized edema: Secondary | ICD-10-CM | POA: Diagnosis not present

## 2022-05-19 DIAGNOSIS — Z95828 Presence of other vascular implants and grafts: Secondary | ICD-10-CM

## 2022-05-19 DIAGNOSIS — D61818 Other pancytopenia: Secondary | ICD-10-CM

## 2022-05-19 LAB — TYPE AND SCREEN
ABO/RH(D): A POS
Antibody Screen: NEGATIVE
Unit division: 0

## 2022-05-19 LAB — BPAM RBC
Blood Product Expiration Date: 202309012359
ISSUE DATE / TIME: 202308101154
Unit Type and Rh: 6200

## 2022-05-23 DIAGNOSIS — I7782 Antineutrophilic cytoplasmic antibody (ANCA) vasculitis: Secondary | ICD-10-CM | POA: Diagnosis not present

## 2022-05-23 DIAGNOSIS — I129 Hypertensive chronic kidney disease with stage 1 through stage 4 chronic kidney disease, or unspecified chronic kidney disease: Secondary | ICD-10-CM | POA: Diagnosis not present

## 2022-05-23 DIAGNOSIS — D631 Anemia in chronic kidney disease: Secondary | ICD-10-CM | POA: Diagnosis not present

## 2022-05-23 DIAGNOSIS — M109 Gout, unspecified: Secondary | ICD-10-CM | POA: Diagnosis not present

## 2022-05-23 DIAGNOSIS — I5032 Chronic diastolic (congestive) heart failure: Secondary | ICD-10-CM | POA: Diagnosis not present

## 2022-05-23 DIAGNOSIS — N184 Chronic kidney disease, stage 4 (severe): Secondary | ICD-10-CM | POA: Diagnosis not present

## 2022-05-23 DIAGNOSIS — E1122 Type 2 diabetes mellitus with diabetic chronic kidney disease: Secondary | ICD-10-CM | POA: Diagnosis not present

## 2022-05-24 ENCOUNTER — Other Ambulatory Visit (HOSPITAL_COMMUNITY): Payer: Medicare HMO

## 2022-05-24 ENCOUNTER — Ambulatory Visit (HOSPITAL_COMMUNITY): Payer: Medicare HMO | Attending: Cardiology

## 2022-05-24 DIAGNOSIS — I5033 Acute on chronic diastolic (congestive) heart failure: Secondary | ICD-10-CM | POA: Diagnosis not present

## 2022-05-24 DIAGNOSIS — K219 Gastro-esophageal reflux disease without esophagitis: Secondary | ICD-10-CM | POA: Diagnosis not present

## 2022-05-24 DIAGNOSIS — I272 Pulmonary hypertension, unspecified: Secondary | ICD-10-CM | POA: Insufficient documentation

## 2022-05-24 DIAGNOSIS — E11319 Type 2 diabetes mellitus with unspecified diabetic retinopathy without macular edema: Secondary | ICD-10-CM | POA: Diagnosis not present

## 2022-05-24 DIAGNOSIS — E1151 Type 2 diabetes mellitus with diabetic peripheral angiopathy without gangrene: Secondary | ICD-10-CM | POA: Diagnosis not present

## 2022-05-24 DIAGNOSIS — R197 Diarrhea, unspecified: Secondary | ICD-10-CM | POA: Diagnosis not present

## 2022-05-24 DIAGNOSIS — A0472 Enterocolitis due to Clostridium difficile, not specified as recurrent: Secondary | ICD-10-CM | POA: Diagnosis not present

## 2022-05-24 LAB — ECHOCARDIOGRAM COMPLETE
AR max vel: 0.74 cm2
AV Area VTI: 0.86 cm2
AV Area mean vel: 0.86 cm2
AV Mean grad: 26 mmHg
AV Peak grad: 44.2 mmHg
Ao pk vel: 3.32 m/s
Area-P 1/2: 2.64 cm2
S' Lateral: 3.4 cm

## 2022-05-25 ENCOUNTER — Inpatient Hospital Stay (HOSPITAL_BASED_OUTPATIENT_CLINIC_OR_DEPARTMENT_OTHER): Payer: Medicare HMO | Admitting: Hematology and Oncology

## 2022-05-25 ENCOUNTER — Other Ambulatory Visit: Payer: Self-pay

## 2022-05-25 ENCOUNTER — Encounter: Payer: Self-pay | Admitting: Hematology and Oncology

## 2022-05-25 ENCOUNTER — Ambulatory Visit (INDEPENDENT_AMBULATORY_CARE_PROVIDER_SITE_OTHER): Payer: Medicare HMO | Admitting: *Deleted

## 2022-05-25 ENCOUNTER — Inpatient Hospital Stay: Payer: Medicare HMO

## 2022-05-25 VITALS — BP 133/58 | HR 91 | Temp 98.1°F | Resp 16 | Ht 59.0 in | Wt 139.1 lb

## 2022-05-25 DIAGNOSIS — Z952 Presence of prosthetic heart valve: Secondary | ICD-10-CM | POA: Diagnosis not present

## 2022-05-25 DIAGNOSIS — Z7901 Long term (current) use of anticoagulants: Secondary | ICD-10-CM

## 2022-05-25 DIAGNOSIS — M7989 Other specified soft tissue disorders: Secondary | ICD-10-CM | POA: Diagnosis not present

## 2022-05-25 DIAGNOSIS — D591 Autoimmune hemolytic anemia, unspecified: Secondary | ICD-10-CM

## 2022-05-25 DIAGNOSIS — L03116 Cellulitis of left lower limb: Secondary | ICD-10-CM | POA: Diagnosis not present

## 2022-05-25 DIAGNOSIS — I7782 Antineutrophilic cytoplasmic antibody (ANCA) vasculitis: Secondary | ICD-10-CM | POA: Diagnosis not present

## 2022-05-25 DIAGNOSIS — L03115 Cellulitis of right lower limb: Secondary | ICD-10-CM | POA: Diagnosis not present

## 2022-05-25 DIAGNOSIS — D594 Other nonautoimmune hemolytic anemias: Secondary | ICD-10-CM

## 2022-05-25 DIAGNOSIS — M8580 Other specified disorders of bone density and structure, unspecified site: Secondary | ICD-10-CM | POA: Diagnosis not present

## 2022-05-25 DIAGNOSIS — N184 Chronic kidney disease, stage 4 (severe): Secondary | ICD-10-CM | POA: Diagnosis not present

## 2022-05-25 DIAGNOSIS — Z7952 Long term (current) use of systemic steroids: Secondary | ICD-10-CM | POA: Diagnosis not present

## 2022-05-25 LAB — CBC WITH DIFFERENTIAL (CANCER CENTER ONLY)
Abs Immature Granulocytes: 0.03 10*3/uL (ref 0.00–0.07)
Basophils Absolute: 0 10*3/uL (ref 0.0–0.1)
Basophils Relative: 0 %
Eosinophils Absolute: 0 10*3/uL (ref 0.0–0.5)
Eosinophils Relative: 0 %
HCT: 23.5 % — ABNORMAL LOW (ref 36.0–46.0)
Hemoglobin: 7.8 g/dL — ABNORMAL LOW (ref 12.0–15.0)
Immature Granulocytes: 0 %
Lymphocytes Relative: 3 %
Lymphs Abs: 0.2 10*3/uL — ABNORMAL LOW (ref 0.7–4.0)
MCH: 30.7 pg (ref 26.0–34.0)
MCHC: 33.2 g/dL (ref 30.0–36.0)
MCV: 92.5 fL (ref 80.0–100.0)
Monocytes Absolute: 0.2 10*3/uL (ref 0.1–1.0)
Monocytes Relative: 3 %
Neutro Abs: 7 10*3/uL (ref 1.7–7.7)
Neutrophils Relative %: 94 %
Platelet Count: 105 10*3/uL — ABNORMAL LOW (ref 150–400)
RBC: 2.54 MIL/uL — ABNORMAL LOW (ref 3.87–5.11)
RDW: 18.2 % — ABNORMAL HIGH (ref 11.5–15.5)
WBC Count: 7.5 10*3/uL (ref 4.0–10.5)
nRBC: 0 % (ref 0.0–0.2)

## 2022-05-25 LAB — RETIC PANEL
Immature Retic Fract: 7.6 % (ref 2.3–15.9)
RBC.: 2.51 MIL/uL — ABNORMAL LOW (ref 3.87–5.11)
Retic Count, Absolute: 93.4 10*3/uL (ref 19.0–186.0)
Retic Ct Pct: 3.7 % — ABNORMAL HIGH (ref 0.4–3.1)
Reticulocyte Hemoglobin: 34.4 pg (ref >27.9)

## 2022-05-25 LAB — PROTIME-INR
INR: 2.5 — ABNORMAL HIGH (ref 0.8–1.2)
Prothrombin Time: 26.7 seconds — ABNORMAL HIGH (ref 11.4–15.2)

## 2022-05-25 LAB — CMP (CANCER CENTER ONLY)
ALT: 26 U/L (ref 0–44)
AST: 30 U/L (ref 15–41)
Albumin: 3.3 g/dL — ABNORMAL LOW (ref 3.5–5.0)
Alkaline Phosphatase: 68 U/L (ref 38–126)
Anion gap: 7 (ref 5–15)
BUN: 104 mg/dL — ABNORMAL HIGH (ref 8–23)
CO2: 20 mmol/L — ABNORMAL LOW (ref 22–32)
Calcium: 8.4 mg/dL — ABNORMAL LOW (ref 8.9–10.3)
Chloride: 105 mmol/L (ref 98–111)
Creatinine: 2.34 mg/dL — ABNORMAL HIGH (ref 0.44–1.00)
GFR, Estimated: 20 mL/min — ABNORMAL LOW (ref >60)
Glucose, Bld: 231 mg/dL — ABNORMAL HIGH (ref 70–99)
Potassium: 5.2 mmol/L — ABNORMAL HIGH (ref 3.5–5.1)
Sodium: 132 mmol/L — ABNORMAL LOW (ref 135–145)
Total Bilirubin: 0.6 mg/dL (ref 0.3–1.2)
Total Protein: 5.2 g/dL — ABNORMAL LOW (ref 6.5–8.1)

## 2022-05-25 LAB — SAMPLE TO BLOOD BANK

## 2022-05-25 NOTE — Progress Notes (Signed)
[facility] Follow up:    [name], [name], MD [place] [address]  DIAGNOSIS: Autoimmune hemolytic anemia  SUMMARY OF HEMATOLOGIC HISTORY:  GWENDOLYNE WELFORD is a 81 y.o. [place] woman with multifactorial anemia, as follows:             (a) anemia of chronic inflammation: As of May 2020 she has a SED rate of 95, CRP 1.1, positive ANA and RF; subsequently she was found to be ANCA positive             (b) hemolysis (mild): she has a mildly elevated LDH and a DAT positive for complement, not IgG; this is c/w (a) above             (c) anemia of renal insufficiency: inadequate EPO resonse to anemia                         (d) GIB/ iron deficiency in patient on lifelong anticoagulaton   (1) Feraheme received 03/01/2019, repeated 03/20/2019              (a) reticulocyte 03/07/2019 up from 43.4 a year ago to 191.8 after iron infusion             (b) feraheme repeated on 05/12/2019             (c) subsequent ferritin levels >100   (2) darbepoetin: starting 05/05/2019 at 141mg given every 2 weeks             (a) dose increased to 200 mcg every week starting 07/01/2019              (b) back to every 2 weeks beginning 08/04/2019 still at 200 mcg dose             (c) switched to Retacrit every 4 weeks as of 08/18/2019   (3) ANCA positive vasculitis: diagnosed 05/08/2019 (titer 1:640)             (a) prednisone 40 mg daily started 05/31/2019             (b) rituximab weekly started 06/12/2019 (received test dose 06/05/2019)                         (i) hepatitis B sAg, core Ab and HIV negative 05/08/2019                         (ii) rituximab held after 07/14/2019 dose (received 5 weekly doses) with neutropenia                         (iii) rituximab resumed 08/18/2019 but changed to monthly                         (iv) rituximab changed to every 2 months beginning with 02/02/2020 dose                         (v) rituximab changed to every 12 weeks after  the 10/12/2020 dose             (c) prednisone tapered to off as of 09/15/2019   (4) osteopenia with a T score of -1.5 on bone density 02/28/2016 at the [facility] center  CURRENT THERAPY: Prednisone  20 mg PO daily  INTERVAL HISTORY:  Daina Cara Coover 81 y.o. female returns for her follow-up of anemia. Since last visit, she continues to do the same.  Both lower extremities slightly more erythematous but patient states that this fluctuates.  She had some diarrhea and went to see Dr. Osborne Casco who adjusted some of her medication and she felt a bit better.  She has also been taking Imodium twice a day.  She was checked for C. difficile and she did not have CHF exacerbation.  She reports some change in her urinary habits, has to urinate frequently, otherwise denies any new complaints.  Rest of the pertinent 10 point ROS reviewed and negative.   Patient Active Problem List   Diagnosis Date Noted   C. difficile colitis 04/19/2022   Colitis 04/18/2022   PAH (pulmonary artery hypertension) ([place]) 04/18/2022   Hypokalemia 04/15/2022   Colovesical fistula, ruled out 04/14/2022   Emphysematous cystitis 04/13/2022   Female bladder prolapse 04/13/2022   Hyperkalemia 02/04/2022   Acute renal failure superimposed on stage 3b chronic kidney disease ([place]) 02/04/2022   Hyperglycemia 01/09/2022   Autoimmune hemolytic anemia ([place]) 01/02/2022   Bilateral lower extremity edema 01/02/2022   Refractory anemia ([place]) 12/15/2021   Abnormal chest x-ray 12/15/2021   Subtherapeutic international normalized ratio (INR) 12/15/2021   Elevated troponin 12/15/2021   Unilateral primary osteoarthritis, right hip 05/31/2021   Midline cystocele 05/30/2021   Primary localized osteoarthritis of pelvic region and thigh 05/30/2021   Vaginal pain 05/30/2021   Asymmetric SNHL (sensorineural hearing loss) 01/06/2021   Referred otalgia of both ears 01/06/2021   Bilateral hearing loss 08/31/2020   Low back pain 02/20/2020   CKD  (chronic kidney disease), stage IV ([place]) 10/13/2019   Leukopenia due to antineoplastic chemotherapy ([place]) 08/18/2019   Thrombophilia ([place]) 06/19/2019   Encounter for general adult medical examination without abnormal findings 06/13/2019   Arteritis ([place]) 06/06/2019   Hypertensive heart and chronic kidney disease with heart failure and stage 1 through stage 4 chronic kidney disease, or unspecified chronic kidney disease ([place]) 06/06/2019   Muscle weakness 06/06/2019   Pancytopenia ([place]) 05/28/2019   Dyspnea 05/28/2019   Vasculitis, ANCA positive 05/28/2019   Disorder of connective tissue ([place]) 05/14/2019   Renal insufficiency 05/08/2019   Acute on chronic diastolic CHF (congestive heart failure) ([place]) 04/07/2019   Left leg cellulitis 04/07/2019   PAF (paroxysmal atrial fibrillation) ([place]) 04/07/2019   Iron deficiency anemia 02/28/2019   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 ([place]) 02/28/2019   Diabetic retinopathy associated with type 2 diabetes mellitus ([place]) 09/02/2018   Chronic pain 06/19/2018   Non-thrombocytopenic purpura ([place]) 05/21/2018   Gout 03/22/2018   Malnutrition of moderate degree 03/12/2018   Diabetes mellitus type 2, controlled ([place]) 03/06/2018   S/P cholecystectomy 03/06/2018   H/O mechanical aortic valve replacement 03/06/2018   Moderate to severe pulmonary hypertension ([place]) 03/06/2018   GERD (gastroesophageal reflux disease) 02/21/2018   Anxiety 02/21/2018   Chronic diastolic CHF (congestive heart failure) ([place]) 02/21/2018   Diarrhea 02/21/2018   General weakness 02/02/2018   Thrombocytopenia ([place]) 01/25/2018   Arm pain, diffuse 01/25/2018   Anemia 01/23/2018   Aortic atherosclerosis ([place]) 01/23/2018   GI bleed 01/18/2018   Age-related osteoporosis without current pathological fracture 05/24/2017   Carotid artery occlusion 05/24/2017   Generalized anxiety disorder 05/24/2017   Hearing loss 05/24/2017   Presence of prosthetic heart valve  05/24/2017   Carotid artery disease ([place]) 08/18/2013   Peripheral arterial disease ([place]) 08/18/2013   Diabetes mellitus without complication (  [place]) 08/18/2013   Hyponatremia 10/06/2011   Hypertension    Hypercholesteremia    Mechanical heart valve present    Chronic anticoagulation     is allergic to flagyl [metronidazole], augmentin [amoxicillin-pot clavulanate], ciprofloxacin, coreg [carvedilol], diflucan [fluconazole], losartan, privigen [immune globulin (human)], sulfamethoxazole-trimethoprim, verapamil, zetia [ezetimibe], zocor [simvastatin - high dose], and gatifloxacin.  MEDICAL HISTORY: Past Medical History:  Diagnosis Date   Aortic atherosclerosis ([place]) 01/23/2018   Atrial fibrillation (HCC)    Benign positional vertigo 10/06/2011   Carotid artery disease ([place])    Chemotherapy induced neutropenia ([place]) 07/21/2019   Cholelithiasis 01/23/2018   Cholelithiasis 01/23/2018   Chronic anticoagulation    Coronary artery disease    status post coronary artery bypass grafting times 07/10/2004   Diabetes mellitus    Hypercholesteremia    Hypertension    Mechanical heart valve present    H. aortic valve replacement at the time of bypass surgery October 2005   Moderate to severe pulmonary hypertension ([place])    Peripheral arterial disease ([place])    history of left common iliac artery PTA and stenting for a chronic total occlusion 08/26/01    SURGICAL HISTORY: Past Surgical History:  Procedure Laterality Date   anterior repair  2009   AORTIC VALVE REPLACEMENT  2005   Dr. Cyndia Bent   CARDIAC CATHETERIZATION  11/10/2004   40% right common illiac, 70% in stent restenosis of distal left common illiac,    CARDIAC CATHETERIZATION  05/18/2004   LAD 50-70% midstenosis, RCA dominant w/50% stenosis, 50% Right common Illiac artery ostial stenosis, 90% in stent restenosis within midportion of left common illiac stent   Carotid Duplex  03/12/2012   RSA-elev. velocities suggestive of a 50-69%  diameter reduction, Right&Left Bulb/Prox ICA-mild-mod.fibrous plaqueelevating Velocities abnormal study.   CHOLECYSTECTOMY N/A 03/01/2018   Procedure: LAPAROSCOPIC CHOLECYSTECTOMY;  Surgeon: Judeth Horn, MD;  Location: [place];  Service: General;  Laterality: N/A;   COLONOSCOPY WITH PROPOFOL N/A 01/22/2018   Procedure: COLONOSCOPY WITH PROPOFOL;  Surgeon: Wilford Corner, MD;  Location: [facility];  Service: Endoscopy;  Laterality: N/A;   ESOPHAGOGASTRODUODENOSCOPY (EGD) WITH PROPOFOL N/A 01/22/2018   Procedure: ESOPHAGOGASTRODUODENOSCOPY (EGD) WITH PROPOFOL;  Surgeon: Wilford Corner, MD;  Location: [facility];  Service: Endoscopy;  Laterality: N/A;   ESOPHAGOGASTRODUODENOSCOPY (EGD) WITH PROPOFOL N/A 11/21/2018   Procedure: ESOPHAGOGASTRODUODENOSCOPY (EGD) WITH PROPOFOL;  Surgeon: [name], MD;  Location: [place];  Service: Gastroenterology;  Laterality: N/A;   ESOPHAGOGASTRODUODENOSCOPY (EGD) WITH PROPOFOL Left 02/27/2019   Procedure: ESOPHAGOGASTRODUODENOSCOPY (EGD) WITH PROPOFOL;  Surgeon: Arta Silence, MD;  Location: [facility] ENDOSCOPY;  Service: Endoscopy;  Laterality: Left;   GIVENS CAPSULE STUDY N/A 11/21/2018   Procedure: GIVENS CAPSULE STUDY;  Surgeon: [name], MD;  Location: [place] [facility] [facility];  Service: Gastroenterology;  Laterality: N/A;  To be deployed during EGD   Lower Ext. Duplex  03/12/2012   Right Proximal CIA- vessel narrowing w/elevated velocities 0-49% diameter reduction. Right SFA-mild mixed density plaque throughout vessel.   NM MYOCAR PERF WALL MOTION  05/19/2010   protocol: Persantine, post stress EF 65%, negative for ischemia, low risk scan   RIGHT HEART CATH N/A 06/27/2018   Procedure: RIGHT HEART CATH;  Surgeon: [name], MD;  Location: [facility];  Service: Cardiovascular;  Laterality: N/A;   TOTAL ABDOMINAL HYSTERECTOMY W/ BILATERAL SALPINGOOPHORECTOMY  1989   TRANSTHORACIC ECHOCARDIOGRAM  08/29/2012   Moderately calcified annulus of mitral  valve, moderate regurg. of both mitral valve and tricuspid valve.     SOCIAL HISTORY:  Social History   Socioeconomic History   Marital status: Widowed    Spouse name: Not on file   Number of children: Not on file   Years of education: Not on file   Highest education level: Not on file  Occupational History   Occupation: Retired  Tobacco Use   Smoking status: Former    Packs/day: 1.00    Years: 30.00    Total pack years: 30.00    Types: Cigarettes   Smokeless tobacco: Never   Tobacco comments:    quit smoking 2005  Vaping Use   Vaping Use: Never used  Substance and Sexual Activity   Alcohol use: No    Alcohol/week: 0.0 standard drinks of alcohol   Drug use: No   Sexual activity: Not Currently    Birth control/protection: None  Other Topics Concern   Not on file  Social History Narrative   Not on file   Social Determinants of Health   Financial Resource Strain: Low Risk  (02/28/2022)   Overall Financial Resource Strain (CARDIA)    Difficulty of Paying Living Expenses: Not hard at all  Food Insecurity: No Food Insecurity (01/13/2022)   Hunger Vital Sign    Worried About Running Out of Food in the Last Year: Never true    [place] in the Last Year: Never true  Transportation Needs: No Transportation Needs (05/12/2022)   PRAPARE - Hydrologist (Medical): No    Lack of Transportation (Non-Medical): No  Physical Activity: Inactive (02/28/2022)   Exercise Vital Sign    Days of Exercise per Week: 0 days    Minutes of Exercise per Session: 0 min  Stress: No Stress Concern Present (02/28/2022)   [place]    Feeling of Stress : Only a little  Social Connections: Socially Isolated (11/25/2019)   Social Connection and Isolation Panel [NHANES]    Frequency of Communication with Friends and Family: More than three times a week    Frequency of Social Gatherings with Friends and  Family: Once a week    Attends Religious Services: Never    Marine scientist or Organizations: No    Attends Archivist Meetings: Never    Marital Status: Widowed  Intimate Partner Violence: Not At Risk (02/28/2022)   Humiliation, Afraid, Rape, and Kick questionnaire    Fear of Current or Ex-Partner: No    Emotionally Abused: No    Physically Abused: No    Sexually Abused: No    FAMILY HISTORY: Family History  Problem Relation Age of Onset   Heart disease Father    Hypertension Father    Hyperlipidemia Father    Breast cancer Neg Hx     Review of Systems  Constitutional:  Positive for fatigue. Negative for appetite change, chills, fever and unexpected weight change.  HENT:   Negative for hearing loss, lump/mass and trouble swallowing.   Eyes:  Negative for eye problems and icterus.  Respiratory:  Positive for shortness of breath. Negative for chest tightness and cough.   Cardiovascular:  Positive for leg swelling. Negative for chest pain and palpitations.  Gastrointestinal:  Negative for abdominal distention, abdominal pain, constipation, diarrhea, nausea and vomiting.  Endocrine: Negative for hot flashes.  Genitourinary:  Negative for difficulty urinating.   Musculoskeletal:  Negative for arthralgias.  Skin:  Positive for rash. Negative for itching.  Neurological:  Negative for dizziness, extremity weakness, headaches and numbness.  Hematological:  Negative for adenopathy. Does not bruise/bleed easily.  Psychiatric/Behavioral:  Negative for depression. The patient is not nervous/anxious.       PHYSICAL EXAMINATION  ECOG PERFORMANCE STATUS: 1 - Symptomatic but completely ambulatory  Vitals:   05/25/22 1208  BP: (!) 133/58  Pulse: 91  Resp: 16  Temp: 98.1 F (36.7 C)  SpO2: 100%    Physical Exam Constitutional:      General: She is not in acute distress.    Appearance: Normal appearance. She is not toxic-appearing.     Comments: She is in a  wheelchair  HENT:     Head: Normocephalic and atraumatic.  Eyes:     General: No scleral icterus. Cardiovascular:     Rate and Rhythm: Normal rate and regular rhythm.     Pulses: Normal pulses.     Heart sounds: Normal heart sounds.  Pulmonary:     Effort: Pulmonary effort is normal.     Breath sounds: Normal breath sounds.  Abdominal:     General: Abdomen is flat. Bowel sounds are normal. There is no distension.     Palpations: Abdomen is soft.     Tenderness: There is no abdominal tenderness. There is no guarding.  Musculoskeletal:        General: Swelling (Bilateral lower extremity swelling with worsening erythema compared to my last visit although patient tells me that this has been improving and occasionally fluctuates.) present.     Cervical back: Neck supple.  Lymphadenopathy:     Cervical: No cervical adenopathy.  Skin:    General: Skin is warm and dry.     Findings: No rash.  Neurological:     Mental Status: She is alert.  Psychiatric:        Mood and Affect: Mood normal.        Behavior: Behavior normal.     LABORATORY DATA:  CBC    Component Value Date/Time   WBC 7.5 05/25/2022 1131   WBC 7.9 05/18/2022 0942   RBC 2.51 (L) 05/25/2022 1131   RBC 2.54 (L) 05/25/2022 1131   HGB 7.8 (L) 05/25/2022 1131   HCT 23.5 (L) 05/25/2022 1131   HCT 28.5 (L) 11/20/2018 1824   PLT 105 (L) 05/25/2022 1131   MCV 92.5 05/25/2022 1131   MCH 30.7 05/25/2022 1131   MCHC 33.2 05/25/2022 1131   RDW 18.2 (H) 05/25/2022 1131   LYMPHSABS 0.2 (L) 05/25/2022 1131   MONOABS 0.2 05/25/2022 1131   EOSABS 0.0 05/25/2022 1131   BASOSABS 0.0 05/25/2022 1131    CMP     Component Value Date/Time   NA 132 (L) 05/25/2022 1131   NA 137 12/06/2017 1436   K 5.2 (H) 05/25/2022 1131   CL 105 05/25/2022 1131   CO2 20 (L) 05/25/2022 1131   GLUCOSE 231 (H) 05/25/2022 1131   BUN 104 (H) 05/25/2022 1131   BUN 20 12/06/2017 1436   CREATININE 2.34 (H) 05/25/2022 1131   CALCIUM 8.4 (L)  05/25/2022 1131   PROT 5.2 (L) 05/25/2022 1131   ALBUMIN 3.3 (L) 05/25/2022 1131   AST 30 05/25/2022 1131   ALT 26 05/25/2022 1131   ALKPHOS 68 05/25/2022 1131   BILITOT 0.6 05/25/2022 1131   GFRNONAA 20 (L) 05/25/2022 1131   GFRAA 32 (L) 03/30/2020 0932     ASSESSMENT and THERAPY PLAN:   CYNDEE GIAMMARCO is a 81 y.o. female who presents for multifactorial anemia, previously treated with rituximab for ANCA positive hemolytic anemia, previously  on maintenance rituximab every 12 weeks presented with worsening anemia thought to be related to her hemolysis.  She was diagnosed with elevated ANCA titers 1 is to 640 and previously treated for ANCA positive hemolytic anemia, seen by rheumatology Dr. Salome Holmes back in 2020, responded at that time to prednisone and rituximab and continued on rituximab maintenance.  However her rituximab was changed to every 78-monthinfusions and she relapsed before her second cycle of every 357-monthituximab.   # Multifactorial Anemia:   --Bone marrow biopsy from 02/20/2022 showed normocellular marrow with trilineage hematopoiesis. No leukemia, lymphoma or high-grade dysplasia identified. --CT CAP from 02/20/2022 did not show any underlying cause for anemia.  --ANCA titers are normal when checked on 02/10/2022 --Currently on prednisone 20 mg PO daily, mycophenolate was increased to 1000 mg however this was discontinued while she was hospitalized -- So far we have attempted prednisone taper 4 times and this was not successful.  Every time we dropped her dose below 20 mg, she has worsening hemolysis and anemia requiring blood transfusion.  We have talked on multiple instances about the complications with steroids including hyperglycemia, lower extremity swelling and however since this seems to be the only effective and reasonably well-tolerated medication, we have agreed to continue it at the current dose.   --[facility]  for any additional recommendations.   --Consider PRBC transfusion if hemoglobin is less than adequately to 8.  Since her reticulocyte count appears to be better compared to her last visit although her hemoglobin is right under 8, I have deferred transfusion today. --RTC for weekly labs +/- transfusions and follow up  #Erythema of bilateral lower extremities, likely venous stasis, no definitive evidence of cellulitis. --We will continue to monitor this  #Warfarin therapy for St. Jude's valve --Under the care of  [facility]. --Today's INR was 2.5.  Patient expressed understanding of the plan provided. She is aware to reach out if she has any new questions or concerns.   I have spent a total of 30 minutes minutes of face-to-face and non-face-to-face time, preparing to see the patient, performing a medically appropriate examination, counseling and educating the patient, documenting clinical information in the electronic health record, and care coordination.   PrBenay PikeD

## 2022-05-25 NOTE — Patient Instructions (Signed)
Description   Spoke with pt's son and pt over speaker phone; continue 1 tablet daily except 1.5 tablets on Sundays and Fridays. [facility] show up in Epic (normally done weekly).  Call with Prednisone doseage changes ('20mg'$  daily starting 7/5). Please keep green leafy veggies and boost in your diet. Call with any new meds [phone] & if you have any procedures where you need to hold warfarin, have them fax a clearance form or labs to [phone].

## 2022-05-25 NOTE — Patient Outreach (Signed)
[facility]) Care Management  05/25/2022  [name] May 30, 1941 [id number]   Telephone call to patient for follow up. No answer. HIPAA compliant voice message left.    Plan: RN CM will attempt again within 4 business days.    [name], RN, MSN [facility] Care Management Care Management Coordinator Direct Line [phone] Toll Free: [phone]  Fax: [phone]

## 2022-05-29 ENCOUNTER — Telehealth: Payer: Self-pay | Admitting: *Deleted

## 2022-05-29 NOTE — Telephone Encounter (Addendum)
-----   Message from Benay Pike, MD sent at 05/25/2022 10:00 PM EDT ----- Can we send her BMP to her nephrologist. Thanks.  Faxed labs per above- called to verify receipt - informed by Leontine Locket- Dr Shelva Majestic assistant- their office was having technical issues and given new fax number.  Labs refaxed to above.

## 2022-05-30 ENCOUNTER — Other Ambulatory Visit: Payer: Self-pay

## 2022-05-30 NOTE — Patient Outreach (Signed)
[facility]) Care Management  05/30/2022  [name] 01-04-1941 [id number]   Telephone call to patient for follow up.Patient reports a little better but some sluggish and thinks she will need a blood transfusion on Friday when she goes for follow up.  She continues to manage heart failure and diabetes.  No concerns.    Advised patient that she will be following up with possible [facility] Care Management in the future. Patient verbalized understanding.    Care Plan : RN CM Plan of Care  Updates made by Jon Billings, RN since 05/30/2022 12:00 AM     Problem: Chronic disease management and care coordination needs of HF, DM, and Anemia   Priority: High     Long-Range Goal: Development of plan of care for management of HF and DM   Start Date: 01/13/2022  Expected End Date: 10/08/2022  This Visit's Progress: On track  Recent Progress: On track  Priority: High  Note:   Current Barriers:  Chronic Disease Management support and education needs related to CHF and DMII   RNCM Clinical Goal(s):  Patient will verbalize basic understanding of  CHF and DMII disease process and self health management plan as evidenced by No acute exacerbation of heart failure and blood sugars less than 300 continue to work with [place] to address care management and care coordination needs related to  CHF and DMII as evidenced by adherence to CM Team Scheduled appointments through collaboration with RN Care manager, provider, and care team.   Interventions: Education and support related to heart failure and diabetes Inter-disciplinary care team collaboration (see longitudinal plan of care) Evaluation of current treatment plan related to  self management and patient's adherence to plan as established by provider   Heart Failure Interventions:  (Status:  Goal on track:  Yes.) Long Term Goal Basic overview and discussion of pathophysiology of Heart Failure reviewed Provided education on  low sodium diet Discussed importance of daily weight and advised patient to weigh and record daily  Diabetes Interventions:  (Status:  Goal on track:  Yes.) Long Term Goal Assessed patient's understanding of A1c goal: <7% Provided education to patient about basic DM disease process Reviewed medications with patient and discussed importance of medication adherence Discussed plans with patient for ongoing care management follow up and provided patient with direct contact information for care management team Discussed importance of monitoring carbohydrates and sugars.  Lab Results  Component Value Date   HGBA1C 4.6 (L) 02/24/2019  01/13/22 Patient having some problems with her anemia.  She is on prednisone and has had a blood transfusion.  Patient reports she is now on tresiba for her sugars 14 units daily.  Patient reports blood sugar however this morning was 253.  She is concerned with highs and will be calling physician to reports sugars and next steps.   Discussed importance of tight carbohydrate control to help with her sugars.  Weight up to 131 lbs. However, due to patient on prednisone explains weight gain.  CM reiterated signs of heart failure.    01/31/22 Patient continues to deal with her anemia.  However, prednisone has started to be tapered. Patient working with pharmacist to adjust [name] accordingly.  Patient reports highest sugar was 369 in the last 7-8 days.  Discussed affects of prednisone on sugars.  Patient weight today 127 lbs.  Patient continues with heart failure management such as no salt, daily weights, and watching for swelling.    02/13/22 Patient continues with  diabetes management.  Blood sugar continues to be up and down due to prednisone.  Discussed tight carbohydrate control.  Patient weight up since hospitalization due to blood transfusion and IV fluids.  Most recent weight 138 lbs.  Patient reports some swelling to legs.  Encouraged to limit salt intake and continue daily  weights.    02/28/22 Patient continues on prednisone causing her sugars to be irradict.  Patient on sliding scale now.  Discussed importance of continued diabetes management.  Patient weight at 137 lbs.  Heart failure management continues.    03/20/22 Patient blood sugars continue to be up and down due to prednisone use.  Patient blood sugar this am.  Patient weight 138 lbs.    04/06/22 Patient managing.  Blood sugars are less than 300.  Weight around 138 lbs.  Disease management reiterated.  05/12/22 Patient blood sugars are in the 200's during the day but 100 range in the am.  Patient continues to take prednisone for her anemia which has caused her sugars to be higher. Patient continues to manage sugars with sliding scale.  Heart failure management continues.  05/30/22 Patient reports sugars continue up and down.  But managing.  Weight stable around 138. No concerns.    Patient Goals/Self-Care Activities: Heart Failure and Diabetes Take all medications as prescribed Attend all scheduled provider appointments call office if I gain more than 2 pounds in one day or 5 pounds in one week track weight in diary use salt in moderation watch for swelling in feet, ankles and legs every day weigh myself daily follow rescue plan if symptoms flare-up check blood sugar at prescribed times: twice daily enter blood sugar readings and medication or insulin into daily log take the blood sugar log to all doctor visits manage portion size Limit food high in carbohydrates such as rice, bread, potatoes, pasta  Follow Up Plan:  Patient will be followed up by outside CM    Long-Range Goal: Development of plan of care for management of anemia   Start Date: 02/13/2022  Expected End Date: 10/08/2022  This Visit's Progress: Not on track  Recent Progress: On track  Priority: High  Note:   Current Barriers:  Chronic Disease Management support and education needs related to Anemia   RNCM Clinical Goal(s):   Patient will verbalize basic understanding of  Anemia disease process and self health management plan as evidenced by hemoglobin staying above 7.0  through collaboration with RN Care manager, provider, and care team.   Interventions: Education and support related to anemia Inter-disciplinary care team collaboration (see longitudinal plan of care) Evaluation of current treatment plan related to  self management and patient's adherence to plan as established by provider   Anemia  (Status:  Goal on track:  Yes.)  Long Term Goal Evaluation of current treatment plan related to  Anemia ,  self-management and patient's adherence to plan as established by provider. Discussed plans with patient for ongoing care management follow up and provided patient with direct contact information for care management team Evaluation of current treatment plan related to anemia and patient's adherence to plan as established by provider Provided education to patient re: Iron rich foods and importance of follow up with physician Discussed plans with patient for ongoing care management follow up and provided patient with direct contact information for care management team  Patient Goals/Self-Care Activities: Anemia Take all medications as prescribed Attend all scheduled provider appointments Notify physician for any changes in condition such as shortness of  breath and increased fatigue.   Pace self with activities Eat iron rich foods such as red meat, salmon, breakfast cereal with iron.    02/28/22 Patient reports doing very well.  Pleased with blood work on yesterday hemoglobin 9.2 and no transfusion needed.  Encouraged to eat protein and iron rich foods as she can.  Remains on prednisone but 15 mg this week per patient.  No concerns.    03/20/22 Patient continues to battle with anemia. She had a blood transfusion on Friday.  Patient reports feeling some better after.  Patient remains on prednisone at 20 mg.   Reiterated importance of iron rich foods.    04/06/22 Patient continues to manage with anemia.  Last Hemoglobin 8.7.  Appointment on 04/07/22.  No concerns.   05/12/22  Patient reports right now anemia stable. She has not needed a blood Transfusion.  Follow up continues with oncology.  05/30/22 Patient her blood is low around 7-8. She is feeling sluggish.  She states she goes for follow up on Friday for possible transfusion.     Follow Up Plan:  Patient will be followed up by outside CM    Plan: Patient will be followed up by outside [place], RN, MSN [place] Management Care Management Coordinator Direct Line [phone] Toll Free: [phone]  Fax: [phone]

## 2022-05-31 DIAGNOSIS — E1151 Type 2 diabetes mellitus with diabetic peripheral angiopathy without gangrene: Secondary | ICD-10-CM | POA: Diagnosis not present

## 2022-05-31 DIAGNOSIS — N390 Urinary tract infection, site not specified: Secondary | ICD-10-CM | POA: Diagnosis not present

## 2022-05-31 DIAGNOSIS — R197 Diarrhea, unspecified: Secondary | ICD-10-CM | POA: Diagnosis not present

## 2022-05-31 DIAGNOSIS — I739 Peripheral vascular disease, unspecified: Secondary | ICD-10-CM | POA: Diagnosis not present

## 2022-05-31 DIAGNOSIS — E11319 Type 2 diabetes mellitus with unspecified diabetic retinopathy without macular edema: Secondary | ICD-10-CM | POA: Diagnosis not present

## 2022-05-31 DIAGNOSIS — A0472 Enterocolitis due to Clostridium difficile, not specified as recurrent: Secondary | ICD-10-CM | POA: Diagnosis not present

## 2022-05-31 DIAGNOSIS — K219 Gastro-esophageal reflux disease without esophagitis: Secondary | ICD-10-CM | POA: Diagnosis not present

## 2022-05-31 DIAGNOSIS — R309 Painful micturition, unspecified: Secondary | ICD-10-CM | POA: Diagnosis not present

## 2022-06-01 ENCOUNTER — Other Ambulatory Visit: Payer: Self-pay | Admitting: Physician Assistant

## 2022-06-02 ENCOUNTER — Ambulatory Visit (INDEPENDENT_AMBULATORY_CARE_PROVIDER_SITE_OTHER): Payer: Medicare HMO | Admitting: Cardiology

## 2022-06-02 ENCOUNTER — Other Ambulatory Visit: Payer: Self-pay

## 2022-06-02 ENCOUNTER — Inpatient Hospital Stay: Payer: Medicare HMO

## 2022-06-02 ENCOUNTER — Inpatient Hospital Stay (HOSPITAL_BASED_OUTPATIENT_CLINIC_OR_DEPARTMENT_OTHER): Payer: Medicare HMO | Admitting: Physician Assistant

## 2022-06-02 VITALS — BP 116/93 | HR 73 | Temp 97.7°F | Resp 16 | Ht 59.0 in | Wt 139.5 lb

## 2022-06-02 DIAGNOSIS — N184 Chronic kidney disease, stage 4 (severe): Secondary | ICD-10-CM | POA: Diagnosis not present

## 2022-06-02 DIAGNOSIS — L03115 Cellulitis of right lower limb: Secondary | ICD-10-CM | POA: Diagnosis not present

## 2022-06-02 DIAGNOSIS — D591 Autoimmune hemolytic anemia, unspecified: Secondary | ICD-10-CM

## 2022-06-02 DIAGNOSIS — M8580 Other specified disorders of bone density and structure, unspecified site: Secondary | ICD-10-CM | POA: Diagnosis not present

## 2022-06-02 DIAGNOSIS — M7989 Other specified soft tissue disorders: Secondary | ICD-10-CM | POA: Diagnosis not present

## 2022-06-02 DIAGNOSIS — L03116 Cellulitis of left lower limb: Secondary | ICD-10-CM | POA: Diagnosis not present

## 2022-06-02 DIAGNOSIS — Z7952 Long term (current) use of systemic steroids: Secondary | ICD-10-CM | POA: Diagnosis not present

## 2022-06-02 DIAGNOSIS — R197 Diarrhea, unspecified: Secondary | ICD-10-CM | POA: Diagnosis not present

## 2022-06-02 DIAGNOSIS — D594 Other nonautoimmune hemolytic anemias: Secondary | ICD-10-CM

## 2022-06-02 DIAGNOSIS — Z7901 Long term (current) use of anticoagulants: Secondary | ICD-10-CM | POA: Diagnosis not present

## 2022-06-02 DIAGNOSIS — Z952 Presence of prosthetic heart valve: Secondary | ICD-10-CM

## 2022-06-02 DIAGNOSIS — I7782 Antineutrophilic cytoplasmic antibody (ANCA) vasculitis: Secondary | ICD-10-CM | POA: Diagnosis not present

## 2022-06-02 LAB — CMP (CANCER CENTER ONLY)
ALT: 25 U/L (ref 0–44)
AST: 33 U/L (ref 15–41)
Albumin: 3.3 g/dL — ABNORMAL LOW (ref 3.5–5.0)
Alkaline Phosphatase: 64 U/L (ref 38–126)
Anion gap: 7 (ref 5–15)
BUN: 101 mg/dL — ABNORMAL HIGH (ref 8–23)
CO2: 24 mmol/L (ref 22–32)
Calcium: 8.4 mg/dL — ABNORMAL LOW (ref 8.9–10.3)
Chloride: 103 mmol/L (ref 98–111)
Creatinine: 2.42 mg/dL — ABNORMAL HIGH (ref 0.44–1.00)
GFR, Estimated: 19 mL/min — ABNORMAL LOW (ref >60)
Glucose, Bld: 68 mg/dL — ABNORMAL LOW (ref 70–99)
Potassium: 4.6 mmol/L (ref 3.5–5.1)
Sodium: 134 mmol/L — ABNORMAL LOW (ref 135–145)
Total Bilirubin: 0.5 mg/dL (ref 0.3–1.2)
Total Protein: 5.2 g/dL — ABNORMAL LOW (ref 6.5–8.1)

## 2022-06-02 LAB — RETIC PANEL
Immature Retic Fract: 16.9 % — ABNORMAL HIGH (ref 2.3–15.9)
RBC.: 2.4 MIL/uL — ABNORMAL LOW (ref 3.87–5.11)
Retic Count, Absolute: 155.5 10*3/uL (ref 19.0–186.0)
Retic Ct Pct: 6.5 % — ABNORMAL HIGH (ref 0.4–3.1)
Reticulocyte Hemoglobin: 32.4 pg (ref >27.9)

## 2022-06-02 LAB — CBC WITH DIFFERENTIAL/PLATELET
Abs Immature Granulocytes: 0.05 10*3/uL (ref 0.00–0.07)
Basophils Absolute: 0 10*3/uL (ref 0.0–0.1)
Basophils Relative: 0 %
Eosinophils Absolute: 0 10*3/uL (ref 0.0–0.5)
Eosinophils Relative: 0 %
HCT: 23.2 % — ABNORMAL LOW (ref 36.0–46.0)
Hemoglobin: 7.6 g/dL — ABNORMAL LOW (ref 12.0–15.0)
Immature Granulocytes: 1 %
Lymphocytes Relative: 7 %
Lymphs Abs: 0.5 10*3/uL — ABNORMAL LOW (ref 0.7–4.0)
MCH: 31.3 pg (ref 26.0–34.0)
MCHC: 32.8 g/dL (ref 30.0–36.0)
MCV: 95.5 fL (ref 80.0–100.0)
Monocytes Absolute: 0.3 10*3/uL (ref 0.1–1.0)
Monocytes Relative: 4 %
Neutro Abs: 6.1 10*3/uL (ref 1.7–7.7)
Neutrophils Relative %: 88 %
Platelets: 133 10*3/uL — ABNORMAL LOW (ref 150–400)
RBC: 2.43 MIL/uL — ABNORMAL LOW (ref 3.87–5.11)
RDW: 19 % — ABNORMAL HIGH (ref 11.5–15.5)
WBC: 6.9 10*3/uL (ref 4.0–10.5)
nRBC: 0 % (ref 0.0–0.2)

## 2022-06-02 LAB — PROTIME-INR
INR: 2.1 — ABNORMAL HIGH (ref 0.8–1.2)
Prothrombin Time: 23.1 seconds — ABNORMAL HIGH (ref 11.4–15.2)

## 2022-06-02 LAB — PREPARE RBC (CROSSMATCH)

## 2022-06-02 LAB — SAMPLE TO BLOOD BANK

## 2022-06-02 MED ORDER — DIPHENHYDRAMINE HCL 25 MG PO CAPS
25.0000 mg | ORAL_CAPSULE | Freq: Once | ORAL | Status: AC
  Administered 2022-06-02: 25 mg via ORAL
  Filled 2022-06-02: qty 1

## 2022-06-02 MED ORDER — ACETAMINOPHEN 325 MG PO TABS
650.0000 mg | ORAL_TABLET | Freq: Once | ORAL | Status: AC
  Administered 2022-06-02: 650 mg via ORAL
  Filled 2022-06-02: qty 2

## 2022-06-02 MED ORDER — SODIUM CHLORIDE 0.9% IV SOLUTION
250.0000 mL | Freq: Once | INTRAVENOUS | Status: AC
  Administered 2022-06-02: 250 mL via INTRAVENOUS

## 2022-06-02 NOTE — Patient Instructions (Signed)

## 2022-06-03 ENCOUNTER — Encounter: Payer: Self-pay | Admitting: Hematology and Oncology

## 2022-06-03 LAB — TYPE AND SCREEN
ABO/RH(D): A POS
Antibody Screen: NEGATIVE
Unit division: 0

## 2022-06-03 LAB — BPAM RBC
Blood Product Expiration Date: 202309212359
ISSUE DATE / TIME: 202308251437
Unit Type and Rh: 6200

## 2022-06-03 NOTE — Progress Notes (Signed)
[facility] Follow up:    [name], [name], MD [place] [address]  DIAGNOSIS: Autoimmune hemolytic anemia  SUMMARY OF HEMATOLOGIC HISTORY:  [name] is a 81 y.o. [place] woman with multifactorial anemia, as follows:             (a) anemia of chronic inflammation: As of May 2020 she has a SED rate of 95, CRP 1.1, positive ANA and RF; subsequently she was found to be ANCA positive             (b) hemolysis (mild): she has a mildly elevated LDH and a DAT positive for complement, not IgG; this is c/w (a) above             (c) anemia of renal insufficiency: inadequate EPO resonse to anemia                         (d) GIB/ iron deficiency in patient on lifelong anticoagulaton   (1) Feraheme received 03/01/2019, repeated 03/20/2019              (a) reticulocyte 03/07/2019 up from 43.4 a year ago to 191.8 after iron infusion             (b) feraheme repeated on 05/12/2019             (c) subsequent ferritin levels >100   (2) darbepoetin: starting 05/05/2019 at 141mg given every 2 weeks             (a) dose increased to 200 mcg every week starting 07/01/2019              (b) back to every 2 weeks beginning 08/04/2019 still at 200 mcg dose             (c) switched to Retacrit every 4 weeks as of 08/18/2019   (3) ANCA positive vasculitis: diagnosed 05/08/2019 (titer 1:640)             (a) prednisone 40 mg daily started 05/31/2019             (b) rituximab weekly started 06/12/2019 (received test dose 06/05/2019)                         (i) hepatitis B sAg, core Ab and HIV negative 05/08/2019                         (ii) rituximab held after 07/14/2019 dose (received 5 weekly doses) with neutropenia                         (iii) rituximab resumed 08/18/2019 but changed to monthly                         (iv) rituximab changed to every 2 months beginning with 02/02/2020 dose                         (v) rituximab changed to every 12 weeks after  the 10/12/2020 dose             (c) prednisone tapered to off as of 09/15/2019   (4) osteopenia with a T score of -1.5 on bone density 02/28/2016 at the breast center  CURRENT THERAPY: Prednisone  20 mg PO daily  INTERVAL HISTORY:  [name] 81 y.o. female returns for her follow-up of anemia. She is accompanied by her son for this visit. She reports energy levels are fairly stable. She continues to have lower extremity edema with erythema. She said that her left leg was weeping earlier this week. She has her legs wrapped. She reports she still has diarrhea, around 3 episodes per day. She takes 2 tablets of imodium per day with some improvement. She denies easy bruising or signs of bleeding.Her shortness of breath is stable and mainly with exertion. She denies fevers, chills, night sweats,chest pain or cough. She has no other complaints.   Rest of the pertinent 10 point ROS reviewed and negative.   Patient Active Problem List   Diagnosis Date Noted   C. difficile colitis 04/19/2022   Colitis 04/18/2022   PAH (pulmonary artery hypertension) ([place]) 04/18/2022   Hypokalemia 04/15/2022   Colovesical fistula, ruled out 04/14/2022   Emphysematous cystitis 04/13/2022   Female bladder prolapse 04/13/2022   Hyperkalemia 02/04/2022   Acute renal failure superimposed on stage 3b chronic kidney disease ([place]) 02/04/2022   Hyperglycemia 01/09/2022   Autoimmune hemolytic anemia ([place]) 01/02/2022   Bilateral lower extremity edema 01/02/2022   Refractory anemia ([place]) 12/15/2021   Abnormal chest x-ray 12/15/2021   Subtherapeutic international normalized ratio (INR) 12/15/2021   Elevated troponin 12/15/2021   Unilateral primary osteoarthritis, right hip 05/31/2021   Midline cystocele 05/30/2021   Primary localized osteoarthritis of pelvic region and thigh 05/30/2021   Vaginal pain 05/30/2021   Asymmetric SNHL (sensorineural hearing loss) 01/06/2021   Referred otalgia of both ears 01/06/2021    Bilateral hearing loss 08/31/2020   Low back pain 02/20/2020   CKD (chronic kidney disease), stage IV ([place]) 10/13/2019   Leukopenia due to antineoplastic chemotherapy () 08/18/2019   Thrombophilia ([place]) 06/19/2019   Encounter for general adult medical examination without abnormal findings 06/13/2019   Arteritis ([place]) 06/06/2019   Hypertensive heart and chronic kidney disease with heart failure and stage 1 through stage 4 chronic kidney disease, or unspecified chronic kidney disease ([place]) 06/06/2019   Muscle weakness 06/06/2019   Pancytopenia ([place]) 05/28/2019   Dyspnea 05/28/2019   Vasculitis, ANCA positive 05/28/2019   Disorder of connective tissue ([place]) 05/14/2019   Renal insufficiency 05/08/2019   Acute on chronic diastolic CHF (congestive heart failure) ([place]) 04/07/2019   Left leg cellulitis 04/07/2019   PAF (paroxysmal atrial fibrillation) ([place]) 04/07/2019   Iron deficiency anemia 02/28/2019   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 ([place]) 02/28/2019   Diabetic retinopathy associated with type 2 diabetes mellitus ([place]) 09/02/2018   Chronic pain 06/19/2018   Non-thrombocytopenic purpura ([place]) 05/21/2018   Gout 03/22/2018   Malnutrition of moderate degree 03/12/2018   Diabetes mellitus type 2, controlled ([place]) 03/06/2018   S/P cholecystectomy 03/06/2018   H/O mechanical aortic valve replacement 03/06/2018   Moderate to severe pulmonary hypertension ([place]) 03/06/2018   GERD (gastroesophageal reflux disease) 02/21/2018   Anxiety 02/21/2018   Chronic diastolic CHF (congestive heart failure) ([place]) 02/21/2018   Diarrhea 02/21/2018   General weakness 02/02/2018   Thrombocytopenia ([place]) 01/25/2018   Arm pain, diffuse 01/25/2018   Anemia 01/23/2018   Aortic atherosclerosis ([place]) 01/23/2018   GI bleed 01/18/2018   Age-related osteoporosis without current pathological fracture 05/24/2017   Carotid artery occlusion 05/24/2017   Generalized anxiety disorder  05/24/2017   Hearing loss 05/24/2017   Presence of prosthetic heart valve 05/24/2017   Carotid artery disease ([place]) 08/18/2013   Peripheral arterial disease (  [place]) 08/18/2013   Diabetes mellitus without complication ([place]) 92/08/9416   Hyponatremia 10/06/2011   Hypertension    Hypercholesteremia    Mechanical heart valve present    Chronic anticoagulation     is allergic to flagyl [metronidazole], augmentin [amoxicillin-pot clavulanate], ciprofloxacin, coreg [carvedilol], diflucan [fluconazole], losartan, privigen [immune globulin (human)], sulfamethoxazole-trimethoprim, verapamil, zetia [ezetimibe], zocor [simvastatin - high dose], and gatifloxacin.  MEDICAL HISTORY: Past Medical History:  Diagnosis Date   Aortic atherosclerosis ([place]) 01/23/2018   Atrial fibrillation (HCC)    Benign positional vertigo 10/06/2011   Carotid artery disease ([place])    Chemotherapy induced neutropenia ([place]) 07/21/2019   Cholelithiasis 01/23/2018   Cholelithiasis 01/23/2018   Chronic anticoagulation    Coronary artery disease    status post coronary artery bypass grafting times 07/10/2004   Diabetes mellitus    Hypercholesteremia    Hypertension    Mechanical heart valve present    H. aortic valve replacement at the time of bypass surgery October 2005   Moderate to severe pulmonary hypertension ([place])    Peripheral arterial disease ([place])    history of left common iliac artery PTA and stenting for a chronic total occlusion 08/26/01    SURGICAL HISTORY: Past Surgical History:  Procedure Laterality Date   anterior repair  2009   AORTIC VALVE REPLACEMENT  2005   Dr. Cyndia Bent   CARDIAC CATHETERIZATION  11/10/2004   40% right common illiac, 70% in stent restenosis of distal left common illiac,    CARDIAC CATHETERIZATION  05/18/2004   LAD 50-70% midstenosis, RCA dominant w/50% stenosis, 50% Right common Illiac artery ostial stenosis, 90% in stent restenosis within midportion of left common illiac stent    Carotid Duplex  03/12/2012   RSA-elev. velocities suggestive of a 50-69% diameter reduction, Right&Left Bulb/Prox ICA-mild-mod.fibrous plaqueelevating Velocities abnormal study.   CHOLECYSTECTOMY N/A 03/01/2018   Procedure: LAPAROSCOPIC CHOLECYSTECTOMY;  Surgeon: Judeth Horn, MD;  Location: [facility];  Service: General;  Laterality: N/A;   COLONOSCOPY WITH PROPOFOL N/A 01/22/2018   Procedure: COLONOSCOPY WITH PROPOFOL;  Surgeon: Wilford Corner, MD;  Location: [facility];  Service: Endoscopy;  Laterality: N/A;   ESOPHAGOGASTRODUODENOSCOPY (EGD) WITH PROPOFOL N/A 01/22/2018   Procedure: ESOPHAGOGASTRODUODENOSCOPY (EGD) WITH PROPOFOL;  Surgeon: Wilford Corner, MD;  Location: [facility];  Service: Endoscopy;  Laterality: N/A;   ESOPHAGOGASTRODUODENOSCOPY (EGD) WITH PROPOFOL N/A 11/21/2018   Procedure: ESOPHAGOGASTRODUODENOSCOPY (EGD) WITH PROPOFOL;  Surgeon: [name], MD;  Location: [facility];  Service: Gastroenterology;  Laterality: N/A;   ESOPHAGOGASTRODUODENOSCOPY (EGD) WITH PROPOFOL Left 02/27/2019   Procedure: ESOPHAGOGASTRODUODENOSCOPY (EGD) WITH PROPOFOL;  Surgeon: Arta Silence, MD;  Location: [facility] ENDOSCOPY;  Service: Endoscopy;  Laterality: Left;   GIVENS CAPSULE STUDY N/A 11/21/2018   Procedure: GIVENS CAPSULE STUDY;  Surgeon: [name], MD;  Location: [facility];  Service: Gastroenterology;  Laterality: N/A;  To be deployed during EGD   Lower Ext. Duplex  03/12/2012   Right Proximal CIA- vessel narrowing w/elevated velocities 0-49% diameter reduction. Right SFA-mild mixed density plaque throughout vessel.   NM MYOCAR PERF WALL MOTION  05/19/2010   protocol: Persantine, post stress EF 65%, negative for ischemia, low risk scan   RIGHT HEART CATH N/A 06/27/2018   Procedure: RIGHT HEART CATH;  Surgeon: [name], MD;  Location: [facility];  Service: Cardiovascular;  Laterality: N/A;   TOTAL ABDOMINAL HYSTERECTOMY W/ BILATERAL SALPINGOOPHORECTOMY  1989   TRANSTHORACIC  ECHOCARDIOGRAM  08/29/2012   Moderately calcified annulus of mitral valve, moderate regurg. of both mitral valve and  tricuspid valve.     SOCIAL HISTORY: Social History   Socioeconomic History   Marital status: Widowed    Spouse name: Not on file   Number of children: Not on file   Years of education: Not on file   Highest education level: Not on file  Occupational History   Occupation: Retired  Tobacco Use   Smoking status: Former    Packs/day: 1.00    Years: 30.00    Total pack years: 30.00    Types: Cigarettes   Smokeless tobacco: Never   Tobacco comments:    quit smoking 2005  Vaping Use   Vaping Use: Never used  Substance and Sexual Activity   Alcohol use: No    Alcohol/week: 0.0 standard drinks of alcohol   Drug use: No   Sexual activity: Not Currently    Birth control/protection: None  Other Topics Concern   Not on file  Social History Narrative   Not on file   Social Determinants of Health   Financial Resource Strain: Low Risk  (02/28/2022)   Overall Financial Resource Strain (CARDIA)    Difficulty of Paying Living Expenses: Not hard at all  Food Insecurity: No Food Insecurity (01/13/2022)   Hunger Vital Sign    Worried About Running Out of Food in the Last Year: Never true    Ran Out of Food in the Last Year: Never true  Transportation Needs: No Transportation Needs (05/12/2022)   PRAPARE - Administrator, Civil Service (Medical): No    Lack of Transportation (Non-Medical): No  Physical Activity: Inactive (02/28/2022)   Exercise Vital Sign    Days of Exercise per Week: 0 days    Minutes of Exercise per Session: 0 min  Stress: No Stress Concern Present (02/28/2022)   [employer] of Occupational Health - Occupational Stress Questionnaire    Feeling of Stress : Only a little  Social Connections: Socially Isolated (11/25/2019)   Social Connection and Isolation Panel [NHANES]    Frequency of Communication with Friends and Family: More than three  times a week    Frequency of Social Gatherings with Friends and Family: Once a week    Attends Religious Services: Never    Database administrator or Organizations: No    Attends Banker Meetings: Never    Marital Status: Widowed  Intimate Partner Violence: Not At Risk (02/28/2022)   Humiliation, Afraid, Rape, and Kick questionnaire    Fear of Current or Ex-Partner: No    Emotionally Abused: No    Physically Abused: No    Sexually Abused: No    FAMILY HISTORY: Family History  Problem Relation Age of Onset   Heart disease Father    Hypertension Father    Hyperlipidemia Father    Breast cancer Neg Hx     Review of Systems  Constitutional:  Positive for fatigue. Negative for appetite change, chills, fever and unexpected weight change.  HENT:   Negative for hearing loss, lump/mass and trouble swallowing.   Eyes:  Negative for eye problems and icterus.  Respiratory:  Positive for shortness of breath. Negative for chest tightness and cough.   Cardiovascular:  Positive for leg swelling. Negative for chest pain and palpitations.  Gastrointestinal:  Positive for diarrhea. Negative for abdominal distention, abdominal pain, constipation, nausea and vomiting.  Endocrine: Negative for hot flashes.  Genitourinary:  Negative for difficulty urinating.   Musculoskeletal:  Negative for arthralgias.  Skin:  Positive for rash. Negative for itching.  Neurological:  Negative for dizziness, extremity weakness, headaches and numbness.  Hematological:  Negative for adenopathy. Does not bruise/bleed easily.  Psychiatric/Behavioral:  Negative for depression. The patient is not nervous/anxious.       PHYSICAL EXAMINATION  ECOG PERFORMANCE STATUS: 1 - Symptomatic but completely ambulatory  Vitals:   06/02/22 1314  BP: (!) 116/93  Pulse: 73  Resp: 16  Temp: 97.7 F (36.5 C)  SpO2: 98%    Physical Exam Constitutional:      General: She is not in acute distress.    Appearance:  Normal appearance. She is not toxic-appearing.     Comments: She is in a wheelchair  HENT:     Head: Normocephalic and atraumatic.  Eyes:     General: No scleral icterus. Cardiovascular:     Rate and Rhythm: Normal rate and regular rhythm.     Pulses: Normal pulses.     Heart sounds: Normal heart sounds.  Pulmonary:     Effort: Pulmonary effort is normal.     Breath sounds: Normal breath sounds.  Musculoskeletal:        General: Swelling (bilateral lower extremity edema with erythema.) present.     Cervical back: Neck supple.  Lymphadenopathy:     Cervical: No cervical adenopathy.  Skin:    General: Skin is warm and dry.     Findings: No rash.  Neurological:     Mental Status: She is alert.  Psychiatric:        Mood and Affect: Mood normal.        Behavior: Behavior normal.     LABORATORY DATA:  CBC    Component Value Date/Time   WBC 6.9 06/02/2022 1239   RBC 2.40 (L) 06/02/2022 1240   RBC 2.43 (L) 06/02/2022 1239   HGB 7.6 (L) 06/02/2022 1239   HGB 7.8 (L) 05/25/2022 1131   HCT 23.2 (L) 06/02/2022 1239   HCT 28.5 (L) 11/20/2018 1824   PLT 133 (L) 06/02/2022 1239   PLT 105 (L) 05/25/2022 1131   MCV 95.5 06/02/2022 1239   MCH 31.3 06/02/2022 1239   MCHC 32.8 06/02/2022 1239   RDW 19.0 (H) 06/02/2022 1239   LYMPHSABS 0.5 (L) 06/02/2022 1239   MONOABS 0.3 06/02/2022 1239   EOSABS 0.0 06/02/2022 1239   BASOSABS 0.0 06/02/2022 1239    CMP     Component Value Date/Time   NA 134 (L) 06/02/2022 1239   NA 137 12/06/2017 1436   K 4.6 06/02/2022 1239   CL 103 06/02/2022 1239   CO2 24 06/02/2022 1239   GLUCOSE 68 (L) 06/02/2022 1239   BUN 101 (H) 06/02/2022 1239   BUN 20 12/06/2017 1436   CREATININE 2.42 (H) 06/02/2022 1239   CALCIUM 8.4 (L) 06/02/2022 1239   PROT 5.2 (L) 06/02/2022 1239   ALBUMIN 3.3 (L) 06/02/2022 1239   AST 33 06/02/2022 1239   ALT 25 06/02/2022 1239   ALKPHOS 64 06/02/2022 1239   BILITOT 0.5 06/02/2022 1239   GFRNONAA 19 (L) 06/02/2022  1239   GFRAA 32 (L) 03/30/2020 0932     ASSESSMENT and THERAPY PLAN:   [name] is a 81 y.o. female who presents for multifactorial anemia, previously treated with rituximab for ANCA positive hemolytic anemia, previously on maintenance rituximab every 12 weeks presented with worsening anemia thought to be related to her hemolysis.  She was diagnosed with elevated ANCA titers 1 is to 640 and previously treated for ANCA positive hemolytic anemia, seen by rheumatology Dr.  Salome Holmes back in 2020, responded at that time to prednisone and rituximab and continued on rituximab maintenance.  However her rituximab was changed to every 41-month infusions and she relapsed before her second cycle of every 42-month rituximab.   # Multifactorial Anemia:   --Bone marrow biopsy from 02/20/2022 showed normocellular marrow with trilineage hematopoiesis. No leukemia, lymphoma or high-grade dysplasia identified. --CT CAP from 02/20/2022 did not show any underlying cause for anemia.  --ANCA titers are normal when checked on 02/10/2022 --Currently on prednisone 20 mg PO daily, mycophenolate was increased to 1000 mg however this was discontinued while she was hospitalized -- So far we have attempted prednisone taper 4 times and this was not successful.  Every time we dropped her dose below 20 mg, she has worsening hemolysis and anemia requiring blood transfusion.  We have talked on multiple instances about the complications with steroids including hyperglycemia, lower extremity swelling and however since this seems to be the only effective and reasonably well-tolerated medication, we have agreed to continue it at the current dose.   --Patient is  scheduled with [facility] for second opinion on 06/30/2022 for any additional recommendations.   --Consider PRBC transfusion if hemoglobin is less than adequately to 8. --Labs today show Hgb 7.6, Plt 133. Recommend 1 unit of PRBC today --RTC for weekly labs +/- transfusions and  follow up  #Erythema of bilateral lower extremities, likely venous stasis, no definitive evidence of cellulitis. --We will continue to monitor this  #Warfarin therapy for [employer] Jude's valve --Under the care of  [facility]. --Today's INR was 2.1.   #Diarrhea:  --Recommend to continue to take Imodium  #CKD:  --Creatinine is 2.42 today, stable.  --Scheduled for follow up with nephology on 06/15/2022   Patient expressed understanding of the plan provided. She is aware to reach out if she has any new questions or concerns.   I have spent a total of 30 minutes minutes of face-to-face and non-face-to-face time, preparing to see the patient, performing a medically appropriate examination, counseling and educating the patient, documenting clinical information in the electronic health record, and care coordination.   Dede Query PA-C Dept of Hematology and [place] [facility] Phone: [phone]

## 2022-06-06 ENCOUNTER — Other Ambulatory Visit: Payer: Self-pay | Admitting: Gastroenterology

## 2022-06-06 ENCOUNTER — Ambulatory Visit
Admission: RE | Admit: 2022-06-06 | Discharge: 2022-06-06 | Disposition: A | Discharge status: Home / Self Care | Payer: Medicare HMO | Source: Ambulatory Visit | Attending: Gastroenterology | Admitting: Gastroenterology

## 2022-06-06 ENCOUNTER — Ambulatory Visit: Payer: Medicare HMO | Admitting: Obstetrics and Gynecology

## 2022-06-06 DIAGNOSIS — K6389 Other specified diseases of intestine: Secondary | ICD-10-CM | POA: Diagnosis not present

## 2022-06-06 DIAGNOSIS — Z8619 Personal history of other infectious and parasitic diseases: Secondary | ICD-10-CM | POA: Diagnosis not present

## 2022-06-06 DIAGNOSIS — R197 Diarrhea, unspecified: Secondary | ICD-10-CM

## 2022-06-06 DIAGNOSIS — M160 Bilateral primary osteoarthritis of hip: Secondary | ICD-10-CM | POA: Diagnosis not present

## 2022-06-06 DIAGNOSIS — K3189 Other diseases of stomach and duodenum: Secondary | ICD-10-CM | POA: Diagnosis not present

## 2022-06-06 DIAGNOSIS — R140 Abdominal distension (gaseous): Secondary | ICD-10-CM | POA: Diagnosis not present

## 2022-06-08 ENCOUNTER — Inpatient Hospital Stay (HOSPITAL_BASED_OUTPATIENT_CLINIC_OR_DEPARTMENT_OTHER): Payer: Medicare HMO | Admitting: Hematology and Oncology

## 2022-06-08 ENCOUNTER — Inpatient Hospital Stay: Payer: Medicare HMO

## 2022-06-08 ENCOUNTER — Other Ambulatory Visit: Payer: Self-pay

## 2022-06-08 ENCOUNTER — Encounter: Payer: Self-pay | Admitting: Hematology and Oncology

## 2022-06-08 VITALS — BP 138/62 | HR 75 | Temp 97.5°F | Resp 16 | Ht 59.0 in | Wt 144.3 lb

## 2022-06-08 DIAGNOSIS — Z7952 Long term (current) use of systemic steroids: Secondary | ICD-10-CM | POA: Diagnosis not present

## 2022-06-08 DIAGNOSIS — R197 Diarrhea, unspecified: Secondary | ICD-10-CM

## 2022-06-08 DIAGNOSIS — D591 Autoimmune hemolytic anemia, unspecified: Secondary | ICD-10-CM | POA: Diagnosis not present

## 2022-06-08 DIAGNOSIS — I7782 Antineutrophilic cytoplasmic antibody (ANCA) vasculitis: Secondary | ICD-10-CM | POA: Diagnosis not present

## 2022-06-08 DIAGNOSIS — L03115 Cellulitis of right lower limb: Secondary | ICD-10-CM | POA: Diagnosis not present

## 2022-06-08 DIAGNOSIS — N184 Chronic kidney disease, stage 4 (severe): Secondary | ICD-10-CM | POA: Diagnosis not present

## 2022-06-08 DIAGNOSIS — M8580 Other specified disorders of bone density and structure, unspecified site: Secondary | ICD-10-CM | POA: Diagnosis not present

## 2022-06-08 DIAGNOSIS — L03116 Cellulitis of left lower limb: Secondary | ICD-10-CM | POA: Diagnosis not present

## 2022-06-08 DIAGNOSIS — Z7901 Long term (current) use of anticoagulants: Secondary | ICD-10-CM | POA: Diagnosis not present

## 2022-06-08 DIAGNOSIS — R600 Localized edema: Secondary | ICD-10-CM | POA: Diagnosis not present

## 2022-06-08 DIAGNOSIS — D594 Other nonautoimmune hemolytic anemias: Secondary | ICD-10-CM

## 2022-06-08 DIAGNOSIS — M7989 Other specified soft tissue disorders: Secondary | ICD-10-CM | POA: Diagnosis not present

## 2022-06-08 LAB — CBC WITH DIFFERENTIAL/PLATELET
Abs Immature Granulocytes: 0.03 10*3/uL (ref 0.00–0.07)
Basophils Absolute: 0 10*3/uL (ref 0.0–0.1)
Basophils Relative: 0 %
Eosinophils Absolute: 0 10*3/uL (ref 0.0–0.5)
Eosinophils Relative: 1 %
HCT: 25.4 % — ABNORMAL LOW (ref 36.0–46.0)
Hemoglobin: 8.5 g/dL — ABNORMAL LOW (ref 12.0–15.0)
Immature Granulocytes: 1 %
Lymphocytes Relative: 7 %
Lymphs Abs: 0.4 10*3/uL — ABNORMAL LOW (ref 0.7–4.0)
MCH: 31.7 pg (ref 26.0–34.0)
MCHC: 33.5 g/dL (ref 30.0–36.0)
MCV: 94.8 fL (ref 80.0–100.0)
Monocytes Absolute: 0.2 10*3/uL (ref 0.1–1.0)
Monocytes Relative: 2 %
Neutro Abs: 5.7 10*3/uL (ref 1.7–7.7)
Neutrophils Relative %: 89 %
Platelets: 107 10*3/uL — ABNORMAL LOW (ref 150–400)
RBC: 2.68 MIL/uL — ABNORMAL LOW (ref 3.87–5.11)
RDW: 17.7 % — ABNORMAL HIGH (ref 11.5–15.5)
WBC: 6.4 10*3/uL (ref 4.0–10.5)
nRBC: 0 % (ref 0.0–0.2)

## 2022-06-08 LAB — CMP (CANCER CENTER ONLY)
ALT: 61 U/L — ABNORMAL HIGH (ref 0–44)
AST: 71 U/L — ABNORMAL HIGH (ref 15–41)
Albumin: 3.1 g/dL — ABNORMAL LOW (ref 3.5–5.0)
Alkaline Phosphatase: 79 U/L (ref 38–126)
Anion gap: 7 (ref 5–15)
BUN: 92 mg/dL — ABNORMAL HIGH (ref 8–23)
CO2: 23 mmol/L (ref 22–32)
Calcium: 8.2 mg/dL — ABNORMAL LOW (ref 8.9–10.3)
Chloride: 101 mmol/L (ref 98–111)
Creatinine: 2.39 mg/dL — ABNORMAL HIGH (ref 0.44–1.00)
GFR, Estimated: 20 mL/min — ABNORMAL LOW (ref >60)
Glucose, Bld: 244 mg/dL — ABNORMAL HIGH (ref 70–99)
Potassium: 5.1 mmol/L (ref 3.5–5.1)
Sodium: 131 mmol/L — ABNORMAL LOW (ref 135–145)
Total Bilirubin: 0.5 mg/dL (ref 0.3–1.2)
Total Protein: 5 g/dL — ABNORMAL LOW (ref 6.5–8.1)

## 2022-06-08 LAB — RETIC PANEL
Immature Retic Fract: 5.3 % (ref 2.3–15.9)
RBC.: 2.66 MIL/uL — ABNORMAL LOW (ref 3.87–5.11)
Retic Count, Absolute: 89.6 10*3/uL (ref 19.0–186.0)
Retic Ct Pct: 3.4 % — ABNORMAL HIGH (ref 0.4–3.1)
Reticulocyte Hemoglobin: 32.3 pg (ref >27.9)

## 2022-06-08 LAB — SAMPLE TO BLOOD BANK

## 2022-06-08 LAB — PROTIME-INR
INR: 2.2 — ABNORMAL HIGH (ref 0.8–1.2)
Prothrombin Time: 24.5 seconds — ABNORMAL HIGH (ref 11.4–15.2)

## 2022-06-08 NOTE — Progress Notes (Signed)
[facility] Follow up:    [name], [name], MD [place] [address]  DIAGNOSIS: Autoimmune hemolytic anemia  SUMMARY OF HEMATOLOGIC HISTORY:  [name] is a 81 y.o. [place] woman with multifactorial anemia, as follows:             (a) anemia of chronic inflammation: As of May 2020 she has a SED rate of 95, CRP 1.1, positive ANA and RF; subsequently she was found to be ANCA positive             (b) hemolysis (mild): she has a mildly elevated LDH and a DAT positive for complement, not IgG; this is c/w (a) above             (c) anemia of renal insufficiency: inadequate EPO resonse to anemia                         (d) GIB/ iron deficiency in patient on lifelong anticoagulaton   (1) Feraheme received 03/01/2019, repeated 03/20/2019              (a) reticulocyte 03/07/2019 up from 43.4 a year ago to 191.8 after iron infusion             (b) feraheme repeated on 05/12/2019             (c) subsequent ferritin levels >100   (2) darbepoetin: starting 05/05/2019 at 141mg given every 2 weeks             (a) dose increased to 200 mcg every week starting 07/01/2019              (b) back to every 2 weeks beginning 08/04/2019 still at 200 mcg dose             (c) switched to Retacrit every 4 weeks as of 08/18/2019   (3) ANCA positive vasculitis: diagnosed 05/08/2019 (titer 1:640)             (a) prednisone 40 mg daily started 05/31/2019             (b) rituximab weekly started 06/12/2019 (received test dose 06/05/2019)                         (i) hepatitis B sAg, core Ab and HIV negative 05/08/2019                         (ii) rituximab held after 07/14/2019 dose (received 5 weekly doses) with neutropenia                         (iii) rituximab resumed 08/18/2019 but changed to monthly                         (iv) rituximab changed to every 2 months beginning with 02/02/2020 dose                         (v) rituximab changed to every 12 weeks after  the 10/12/2020 dose             (c) prednisone tapered to off as of 09/15/2019   (4) osteopenia with a T score of -1.5 on bone density 02/28/2016 at the [facility] center  CURRENT THERAPY: Prednisone  20 mg PO daily  INTERVAL HISTORY:  [name] 81 y.o. female returns for her follow-up of anemia.  She is here with her son.  Since last visit, no change in health.  She continues to have some bloating and loose bowel movements.  Both lower extremities are very swollen, she has seen her nephrologist, they are reluctant to increase the dose of Lasix because of concern for progressive CKD.  No fevers or chills.  She is working with physical therapy and doing her best.  No bleeding reported. Rest of the pertinent 10 point ROS reviewed and negative.   Patient Active Problem List   Diagnosis Date Noted   C. difficile colitis 04/19/2022   Colitis 04/18/2022   PAH (pulmonary artery hypertension) (HCC) 04/18/2022   Hypokalemia 04/15/2022   Colovesical fistula, ruled out 04/14/2022   Emphysematous cystitis 04/13/2022   Female bladder prolapse 04/13/2022   Hyperkalemia 02/04/2022   Acute renal failure superimposed on stage 3b chronic kidney disease (HCC) 02/04/2022   Hyperglycemia 01/09/2022   Autoimmune hemolytic anemia (HCC) 01/02/2022   Bilateral lower extremity edema 01/02/2022   Refractory anemia (HCC) 12/15/2021   Abnormal chest x-ray 12/15/2021   Subtherapeutic international normalized ratio (INR) 12/15/2021   Elevated troponin 12/15/2021   Unilateral primary osteoarthritis, right hip 05/31/2021   Midline cystocele 05/30/2021   Primary localized osteoarthritis of pelvic region and thigh 05/30/2021   Vaginal pain 05/30/2021   Asymmetric SNHL (sensorineural hearing loss) 01/06/2021   Referred otalgia of both ears 01/06/2021   Bilateral hearing loss 08/31/2020   Low back pain 02/20/2020   CKD (chronic kidney disease), stage IV (HCC) 10/13/2019   Leukopenia due to antineoplastic  chemotherapy (HCC) 08/18/2019   Thrombophilia (HCC) 06/19/2019   Encounter for general adult medical examination without abnormal findings 06/13/2019   Arteritis (HCC) 06/06/2019   Hypertensive heart and chronic kidney disease with heart failure and stage 1 through stage 4 chronic kidney disease, or unspecified chronic kidney disease (HCC) 06/06/2019   Muscle weakness 06/06/2019   Pancytopenia (HCC) 05/28/2019   Dyspnea 05/28/2019   Vasculitis, ANCA positive 05/28/2019   Disorder of connective tissue (HCC) 05/14/2019   Renal insufficiency 05/08/2019   Acute on chronic diastolic CHF (congestive heart failure) (HCC) 04/07/2019   Left leg cellulitis 04/07/2019   PAF (paroxysmal atrial fibrillation) (HCC) 04/07/2019   Iron deficiency anemia 02/28/2019   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 (HCC) 02/28/2019   Diabetic retinopathy associated with type 2 diabetes mellitus (HCC) 09/02/2018   Chronic pain 06/19/2018   Non-thrombocytopenic purpura (HCC) 05/21/2018   Gout 03/22/2018   Malnutrition of moderate degree 03/12/2018   Diabetes mellitus type 2, controlled (HCC) 03/06/2018   S/P cholecystectomy 03/06/2018   H/O mechanical aortic valve replacement 03/06/2018   Moderate to severe pulmonary hypertension (HCC) 03/06/2018   GERD (gastroesophageal reflux disease) 02/21/2018   Anxiety 02/21/2018   Chronic diastolic CHF (congestive heart failure) (HCC) 02/21/2018   Diarrhea 02/21/2018   General weakness 02/02/2018   Thrombocytopenia (HCC) 01/25/2018   Arm pain, diffuse 01/25/2018   Anemia 01/23/2018   Aortic atherosclerosis (HCC) 01/23/2018   GI bleed 01/18/2018   Age-related osteoporosis without current pathological fracture 05/24/2017   Carotid artery occlusion 05/24/2017   Generalized anxiety disorder 05/24/2017   Hearing loss 05/24/2017   Presence of prosthetic heart valve 05/24/2017   Carotid artery disease (HCC) 08/18/2013   Peripheral arterial disease  (HCC) 08/18/2013   Diabetes mellitus without complication (HCC) 08/18/2013   Hyponatremia 10/06/2011   Hypertension    Hypercholesteremia  Mechanical heart valve present    Chronic anticoagulation     is allergic to flagyl [metronidazole], augmentin [amoxicillin-pot clavulanate], ciprofloxacin, coreg [carvedilol], diflucan [fluconazole], losartan, privigen [immune globulin (human)], sulfamethoxazole-trimethoprim, verapamil, zetia [ezetimibe], zocor [simvastatin - high dose], and gatifloxacin.  MEDICAL HISTORY: Past Medical History:  Diagnosis Date   Aortic atherosclerosis ([place]) 01/23/2018   Atrial fibrillation (HCC)    Benign positional vertigo 10/06/2011   Carotid artery disease ([place])    Chemotherapy induced neutropenia ([place]) 07/21/2019   Cholelithiasis 01/23/2018   Cholelithiasis 01/23/2018   Chronic anticoagulation    Coronary artery disease    status post coronary artery bypass grafting times 07/10/2004   Diabetes mellitus    Hypercholesteremia    Hypertension    Mechanical heart valve present    H. aortic valve replacement at the time of bypass surgery October 2005   Moderate to severe pulmonary hypertension ([place])    Peripheral arterial disease ([place])    history of left common iliac artery PTA and stenting for a chronic total occlusion 08/26/01    SURGICAL HISTORY: Past Surgical History:  Procedure Laterality Date   anterior repair  2009   AORTIC VALVE REPLACEMENT  2005   Dr. Cyndia Bent   CARDIAC CATHETERIZATION  11/10/2004   40% right common illiac, 70% in stent restenosis of distal left common illiac,    CARDIAC CATHETERIZATION  05/18/2004   LAD 50-70% midstenosis, RCA dominant w/50% stenosis, 50% Right common Illiac artery ostial stenosis, 90% in stent restenosis within midportion of left common illiac stent   Carotid Duplex  03/12/2012   RSA-elev. velocities suggestive of a 50-69% diameter reduction, Right&Left Bulb/Prox ICA-mild-mod.fibrous plaqueelevating Velocities  abnormal study.   CHOLECYSTECTOMY N/A 03/01/2018   Procedure: LAPAROSCOPIC CHOLECYSTECTOMY;  Surgeon: Judeth Horn, MD;  Location: [place];  Service: General;  Laterality: N/A;   COLONOSCOPY WITH PROPOFOL N/A 01/22/2018   Procedure: COLONOSCOPY WITH PROPOFOL;  Surgeon: Wilford Corner, MD;  Location: [place];  Service: Endoscopy;  Laterality: N/A;   ESOPHAGOGASTRODUODENOSCOPY (EGD) WITH PROPOFOL N/A 01/22/2018   Procedure: ESOPHAGOGASTRODUODENOSCOPY (EGD) WITH PROPOFOL;  Surgeon: Wilford Corner, MD;  Location: [facility];  Service: Endoscopy;  Laterality: N/A;   ESOPHAGOGASTRODUODENOSCOPY (EGD) WITH PROPOFOL N/A 11/21/2018   Procedure: ESOPHAGOGASTRODUODENOSCOPY (EGD) WITH PROPOFOL;  Surgeon: [name], MD;  Location: [place];  Service: Gastroenterology;  Laterality: N/A;   ESOPHAGOGASTRODUODENOSCOPY (EGD) WITH PROPOFOL Left 02/27/2019   Procedure: ESOPHAGOGASTRODUODENOSCOPY (EGD) WITH PROPOFOL;  Surgeon: Arta Silence, MD;  Location: [facility] ENDOSCOPY;  Service: Endoscopy;  Laterality: Left;   GIVENS CAPSULE STUDY N/A 11/21/2018   Procedure: GIVENS CAPSULE STUDY;  Surgeon: [name], MD;  Location: [place];  Service: Gastroenterology;  Laterality: N/A;  To be deployed during EGD   Lower Ext. Duplex  03/12/2012   Right Proximal CIA- vessel narrowing w/elevated velocities 0-49% diameter reduction. Right SFA-mild mixed density plaque throughout vessel.   NM MYOCAR PERF WALL MOTION  05/19/2010   protocol: Persantine, post stress EF 65%, negative for ischemia, low risk scan   RIGHT HEART CATH N/A 06/27/2018   Procedure: RIGHT HEART CATH;  Surgeon: [name], MD;  Location: [facility];  Service: Cardiovascular;  Laterality: N/A;   TOTAL ABDOMINAL HYSTERECTOMY W/ BILATERAL SALPINGOOPHORECTOMY  1989   TRANSTHORACIC ECHOCARDIOGRAM  08/29/2012   Moderately calcified annulus of mitral valve, moderate regurg. of both mitral valve and tricuspid valve.     SOCIAL  HISTORY: Social History   Socioeconomic History   Marital status: Widowed    Spouse name:  Not on file   Number of children: Not on file   Years of education: Not on file   Highest education level: Not on file  Occupational History   Occupation: Retired  Tobacco Use   Smoking status: Former    Packs/day: 1.00    Years: 30.00    Total pack years: 30.00    Types: Cigarettes   Smokeless tobacco: Never   Tobacco comments:    quit smoking 2005  Vaping Use   Vaping Use: Never used  Substance and Sexual Activity   Alcohol use: No    Alcohol/week: 0.0 standard drinks of alcohol   Drug use: No   Sexual activity: Not Currently    Birth control/protection: None  Other Topics Concern   Not on file  Social History Narrative   Not on file   Social Determinants of Health   Financial Resource Strain: Low Risk  (02/28/2022)   Overall Financial Resource Strain (CARDIA)    Difficulty of Paying Living Expenses: Not hard at all  Food Insecurity: No Food Insecurity (01/13/2022)   Hunger Vital Sign    Worried About Running Out of Food in the Last Year: Never true    [place] in the Last Year: Never true  Transportation Needs: No Transportation Needs (05/12/2022)   PRAPARE - Hydrologist (Medical): No    Lack of Transportation (Non-Medical): No  Physical Activity: Inactive (02/28/2022)   Exercise Vital Sign    Days of Exercise per Week: 0 days    Minutes of Exercise per Session: 0 min  Stress: No Stress Concern Present (02/28/2022)   [place]    Feeling of Stress : Only a little  Social Connections: Socially Isolated (11/25/2019)   Social Connection and Isolation Panel [NHANES]    Frequency of Communication with Friends and Family: More than three times a week    Frequency of Social Gatherings with Friends and Family: Once a week    Attends Religious Services: Never    Corporate treasurer or Organizations: No    Attends Archivist Meetings: Never    Marital Status: Widowed  Intimate Partner Violence: Not At Risk (02/28/2022)   Humiliation, Afraid, Rape, and Kick questionnaire    Fear of Current or Ex-Partner: No    Emotionally Abused: No    Physically Abused: No    Sexually Abused: No    FAMILY HISTORY: Family History  Problem Relation Age of Onset   Heart disease Father    Hypertension Father    Hyperlipidemia Father    Breast cancer Neg Hx     Review of Systems  Constitutional:  Positive for fatigue. Negative for appetite change, chills, fever and unexpected weight change.  HENT:   Negative for hearing loss, lump/mass and trouble swallowing.   Eyes:  Negative for eye problems and icterus.  Respiratory:  Positive for shortness of breath. Negative for chest tightness and cough.   Cardiovascular:  Positive for leg swelling. Negative for chest pain and palpitations.  Gastrointestinal:  Positive for diarrhea. Negative for abdominal distention, abdominal pain, constipation, nausea and vomiting.  Endocrine: Negative for hot flashes.  Genitourinary:  Negative for difficulty urinating.   Musculoskeletal:  Negative for arthralgias.  Skin:  Positive for rash. Negative for itching.  Neurological:  Negative for dizziness, extremity weakness, headaches and numbness.  Hematological:  Negative for adenopathy. Does not bruise/bleed easily.  Psychiatric/Behavioral:  Negative  for depression. The patient is not nervous/anxious.       PHYSICAL EXAMINATION  ECOG PERFORMANCE STATUS: 1 - Symptomatic but completely ambulatory  Vitals:   06/08/22 1259  BP: 138/62  Pulse: 75  Resp: 16  Temp: (!) 97.5 F (36.4 C)  SpO2: 100%    Physical Exam Constitutional:      General: She is not in acute distress.    Appearance: Normal appearance. She is not toxic-appearing.     Comments: She is in a wheelchair  HENT:     Head: Normocephalic and atraumatic.  Eyes:      General: No scleral icterus. Cardiovascular:     Rate and Rhythm: Normal rate and regular rhythm.     Pulses: Normal pulses.     Heart sounds: Normal heart sounds.  Pulmonary:     Effort: Pulmonary effort is normal.     Breath sounds: Normal breath sounds.  Musculoskeletal:        General: Swelling (bilateral lower extremity edema with erythema.  Lower part of legs have erythema which has not changed in weeks.  No concern for an active infection) present.     Cervical back: Neck supple.  Lymphadenopathy:     Cervical: No cervical adenopathy.  Skin:    General: Skin is warm and dry.     Findings: No rash.  Neurological:     Mental Status: She is alert.  Psychiatric:        Mood and Affect: Mood normal.        Behavior: Behavior normal.     LABORATORY DATA:  CBC    Component Value Date/Time   WBC 6.4 06/08/2022 1227   RBC 2.66 (L) 06/08/2022 1228   RBC 2.68 (L) 06/08/2022 1227   HGB 8.5 (L) 06/08/2022 1227   HGB 7.8 (L) 05/25/2022 1131   HCT 25.4 (L) 06/08/2022 1227   HCT 28.5 (L) 11/20/2018 1824   PLT 107 (L) 06/08/2022 1227   PLT 105 (L) 05/25/2022 1131   MCV 94.8 06/08/2022 1227   MCH 31.7 06/08/2022 1227   MCHC 33.5 06/08/2022 1227   RDW 17.7 (H) 06/08/2022 1227   LYMPHSABS 0.4 (L) 06/08/2022 1227   MONOABS 0.2 06/08/2022 1227   EOSABS 0.0 06/08/2022 1227   BASOSABS 0.0 06/08/2022 1227    CMP     Component Value Date/Time   NA 134 (L) 06/02/2022 1239   NA 137 12/06/2017 1436   K 4.6 06/02/2022 1239   CL 103 06/02/2022 1239   CO2 24 06/02/2022 1239   GLUCOSE 68 (L) 06/02/2022 1239   BUN 101 (H) 06/02/2022 1239   BUN 20 12/06/2017 1436   CREATININE 2.42 (H) 06/02/2022 1239   CALCIUM 8.4 (L) 06/02/2022 1239   PROT 5.2 (L) 06/02/2022 1239   ALBUMIN 3.3 (L) 06/02/2022 1239   AST 33 06/02/2022 1239   ALT 25 06/02/2022 1239   ALKPHOS 64 06/02/2022 1239   BILITOT 0.5 06/02/2022 1239   GFRNONAA 19 (L) 06/02/2022 1239   GFRAA 32 (L) 03/30/2020 0932      ASSESSMENT and THERAPY PLAN:   [name] is a 81 y.o. female who presents for multifactorial anemia, previously treated with rituximab for ANCA positive hemolytic anemia, previously on maintenance rituximab every 12 weeks presented with worsening anemia thought to be related to her hemolysis.  She was diagnosed with elevated ANCA titers 1 is to 640 and previously treated for ANCA positive hemolytic anemia, seen by rheumatology Dr. Salome Holmes back in  2020, responded at that time to prednisone and rituximab and continued on rituximab maintenance.  However her rituximab was changed to every 33-month infusions and she relapsed before her second cycle of every 7-month rituximab.   # Multifactorial Anemia:   --Bone marrow biopsy from 02/20/2022 showed normocellular marrow with trilineage hematopoiesis. No leukemia, lymphoma or high-grade dysplasia identified. --CT CAP from 02/20/2022 did not show any underlying cause for anemia.  --ANCA titers are normal when checked on 02/10/2022 --Currently on prednisone 20 mg PO daily, mycophenolate was increased to 1000 mg however this was discontinued while she was hospitalized -- So far we have attempted prednisone taper 4 times and this was not successful.  Every time we dropped her dose below 20 mg, she has worsening hemolysis and anemia requiring blood transfusion.  We have talked on multiple instances about the complications with steroids including hyperglycemia, lower extremity swelling and however since this seems to be the only effective and reasonably well-tolerated medication, we have agreed to continue it at the current dose.   --Patient is  scheduled with [facility] for second opinion on 06/30/2022 for any additional recommendations.   --Consider PRBC transfusion if hemoglobin is less than adequately to 8. --Labs today with hemoglobin of 8.5, platelet count of 107,000.  No indication for transfusion -Return to clinic with weekly labs and physician visit  every other week  #Erythema of bilateral lower extremities, likely venous stasis, no definitive evidence of cellulitis. -- This is stable.  We will continue to monitor  #Warfarin therapy for St. Jude's valve --Under the care of  [facility].  INR today is 2.2  #CKD:  -- Followed by nephrology.   Patient expressed understanding of the plan provided. She is aware to reach out if she has any new questions or concerns.   I have spent a total of 30 minutes minutes of face-to-face and non-face-to-face time, preparing to see the patient, performing a medically appropriate examination, counseling and educating the patient, documenting clinical information in the electronic health record, and care coordination.

## 2022-06-08 NOTE — Patient Outreach (Signed)
[facility]) Care Management  06/08/2022  CEIRRA BELLI 02-13-1941 [id number]   RN CM closing patient case: Case transitioned to [facility] Care Management team.  [name], RN, MSN [facility] Care Management Coordinator Direct Line [phone] Toll Free: [phone]  Fax: [phone]

## 2022-06-09 ENCOUNTER — Ambulatory Visit (INDEPENDENT_AMBULATORY_CARE_PROVIDER_SITE_OTHER): Payer: Medicare HMO

## 2022-06-09 ENCOUNTER — Encounter: Payer: Self-pay | Admitting: Podiatry

## 2022-06-09 ENCOUNTER — Ambulatory Visit (INDEPENDENT_AMBULATORY_CARE_PROVIDER_SITE_OTHER): Payer: Medicare HMO | Admitting: Podiatry

## 2022-06-09 DIAGNOSIS — M1991 Primary osteoarthritis, unspecified site: Secondary | ICD-10-CM | POA: Insufficient documentation

## 2022-06-09 DIAGNOSIS — E1151 Type 2 diabetes mellitus with diabetic peripheral angiopathy without gangrene: Secondary | ICD-10-CM | POA: Diagnosis not present

## 2022-06-09 DIAGNOSIS — M2550 Pain in unspecified joint: Secondary | ICD-10-CM | POA: Insufficient documentation

## 2022-06-09 DIAGNOSIS — B351 Tinea unguium: Secondary | ICD-10-CM | POA: Diagnosis not present

## 2022-06-09 DIAGNOSIS — M79674 Pain in right toe(s): Secondary | ICD-10-CM | POA: Diagnosis not present

## 2022-06-09 DIAGNOSIS — R768 Other specified abnormal immunological findings in serum: Secondary | ICD-10-CM | POA: Insufficient documentation

## 2022-06-09 DIAGNOSIS — Z952 Presence of prosthetic heart valve: Secondary | ICD-10-CM | POA: Diagnosis not present

## 2022-06-09 DIAGNOSIS — M79675 Pain in left toe(s): Secondary | ICD-10-CM

## 2022-06-09 DIAGNOSIS — Z7901 Long term (current) use of anticoagulants: Secondary | ICD-10-CM

## 2022-06-09 DIAGNOSIS — J8409 Other alveolar and parieto-alveolar conditions: Secondary | ICD-10-CM | POA: Insufficient documentation

## 2022-06-09 NOTE — Progress Notes (Signed)
Subjective:  Patient ID: [name], female    DOB: 1940-12-18,  MRN: [id number]  [name] presents to clinic today for at risk foot care. Pt has h/o NIDDM with PAD and painful elongated mycotic toenails 1-5 bilaterally which are tender when wearing enclosed shoe gear. Pain is relieved with periodic professional debridement.   Patient's blood sugar was 166 mg/dl today. Last known  HgA1c was 7.0%.    New problem(s): None.   Her son is present during today's visit. [name] continues to have her legs wrapped with ace bandages daily. She states she should be getting a call from Cardiology to schedule a follow up appointment.  PCP is [name], [name], MD , and last visit was June 02, 2022.  Allergies  Allergen Reactions   Flagyl [Metronidazole] Rash and Other (See Comments)    ALL-OVER BODY RASH   Augmentin [Amoxicillin-Pot Clavulanate] Diarrhea and Nausea And Vomiting   Ciprofloxacin Nausea Only and Other (See Comments)    Stomach ache   Coreg [Carvedilol] Other (See Comments)    Terrible cramping in the feet and had a lot of bowel movements, but not diarrhea   Diflucan [Fluconazole] Diarrhea   Losartan Swelling and Other (See Comments)    Patient doesn't recall site of swelling   Privigen [Immune Globulin (Human)] Other (See Comments)    Severe/excruciating pain   Sulfamethoxazole-Trimethoprim Nausea Only and Other (See Comments)    Stomach upset   Verapamil Hives   Zetia [Ezetimibe] Other (See Comments)    Reaction not recalled   Zocor [Simvastatin - High Dose] Other (See Comments)    Reaction not recalled   Gatifloxacin Rash and Other (See Comments)    Redness to skin around eye    Review of Systems: Negative except as noted in the HPI. Objective:   Constitutional [name] is a pleasant 81 y.o. Caucasian female, WD, WN in NAD. AAO x 3.   Vascular CFT <3 seconds b/l LE. No pain with calf compression b/l. Lower extremity skin temperature gradient within  normal limits. She has nonpalpable pedal pulses secondary to BLE edema. +2 pitting edema left lower extremity; +3 pitting edema right lower extremity. No ischemia or gangrene noted b/l LE. No cyanosis or clubbing noted b/l LE.  Neurologic Normal speech. Oriented to person, place, and time. Protective sensation intact 5/5 intact bilaterally with 10g monofilament b/l. Vibratory sensation intact b/l.  Dermatologic Pedal skin thin, shiny and atrophic b/l LE. No open wounds b/l LE. No interdigital macerations noted b/l LE. Toenails 1-5 bilaterally elongated, discolored, dystrophic, thickened, and crumbly with subungual debris and tenderness to dorsal palpation. Incurvated nailplate left great toe.  Nail border hypertrophy absent. There is tenderness to palpation. Sign(s) of infection: no clinical signs of infection noted on examination today..  Orthopedic: Muscle strength 5/5 to all lower extremity muscle groups bilaterally. No pain, crepitus or joint limitation noted with ROM bilateral LE. No gross bony deformities bilaterally. Tailor's bunion deformity noted b/l LE. Utilizes wheelchair for mobility assistance.   Radiographs: None  Last A1c:     Latest Ref Rng & Units 04/27/2022    5:46 AM 02/04/2022    8:00 PM  Hemoglobin A1C  Hemoglobin-A1c 4.8 - 5.6 % 6.3  6.6    Assessment:   1. Pain due to onychomycosis of toenails of both feet   2. Type II diabetes mellitus with peripheral circulatory disorder [facility])    Plan:  Patient was evaluated and treated and all questions answered.  Consent given for treatment as described below: -Examined patient. -Patient to continue soft, supportive shoe gear daily. -Toenails 1-5 b/l were debrided in length and girth with sterile nail nippers and dremel without iatrogenic bleeding.  -Offending nail border debrided and curretaged left great toe utilizing sterile nail nipper and currette. Border cleansed with alcohol and triple antibiotic applied. No further treatment  required by patient/caregiver. Call office if there are any concerns. -Patient/POA to call should there be question/concern in the interim.  Return in about 3 months (around 09/08/2022).  Marzetta Board, DPM

## 2022-06-09 NOTE — Patient Instructions (Signed)
Description   Spoke with pt's son and pt over speaker phone; continue 1 tablet daily except 1.5 tablets on Sundays and Fridays. [facility]abs show up in Epic (normally done weekly).  Call with Prednisone doseage changes ('20mg'$  daily). Please keep green leafy veggies and boost in your diet. Call with any new meds [phone] & if you have any procedures where you need to hold warfarin, have them fax a clearance form or labs to [phone].

## 2022-06-14 ENCOUNTER — Other Ambulatory Visit: Payer: Self-pay | Admitting: Physician Assistant

## 2022-06-14 DIAGNOSIS — I739 Peripheral vascular disease, unspecified: Secondary | ICD-10-CM | POA: Diagnosis not present

## 2022-06-14 DIAGNOSIS — D594 Other nonautoimmune hemolytic anemias: Secondary | ICD-10-CM

## 2022-06-14 DIAGNOSIS — E11319 Type 2 diabetes mellitus with unspecified diabetic retinopathy without macular edema: Secondary | ICD-10-CM | POA: Diagnosis not present

## 2022-06-14 DIAGNOSIS — A0472 Enterocolitis due to Clostridium difficile, not specified as recurrent: Secondary | ICD-10-CM | POA: Diagnosis not present

## 2022-06-14 DIAGNOSIS — R309 Painful micturition, unspecified: Secondary | ICD-10-CM | POA: Diagnosis not present

## 2022-06-14 DIAGNOSIS — R197 Diarrhea, unspecified: Secondary | ICD-10-CM | POA: Diagnosis not present

## 2022-06-14 DIAGNOSIS — K219 Gastro-esophageal reflux disease without esophagitis: Secondary | ICD-10-CM | POA: Diagnosis not present

## 2022-06-14 DIAGNOSIS — E1151 Type 2 diabetes mellitus with diabetic peripheral angiopathy without gangrene: Secondary | ICD-10-CM | POA: Diagnosis not present

## 2022-06-15 ENCOUNTER — Encounter: Payer: Self-pay | Admitting: Adult Health

## 2022-06-15 ENCOUNTER — Inpatient Hospital Stay: Payer: Medicare HMO

## 2022-06-15 ENCOUNTER — Other Ambulatory Visit: Payer: Self-pay

## 2022-06-15 ENCOUNTER — Inpatient Hospital Stay: Payer: Medicare HMO | Attending: Oncology | Admitting: Adult Health

## 2022-06-15 VITALS — BP 140/52 | HR 67 | Temp 98.1°F | Resp 18 | Ht 59.0 in | Wt 142.7 lb

## 2022-06-15 DIAGNOSIS — N189 Chronic kidney disease, unspecified: Secondary | ICD-10-CM | POA: Insufficient documentation

## 2022-06-15 DIAGNOSIS — I7782 Antineutrophilic cytoplasmic antibody (ANCA) vasculitis: Secondary | ICD-10-CM | POA: Diagnosis not present

## 2022-06-15 DIAGNOSIS — D594 Other nonautoimmune hemolytic anemias: Secondary | ICD-10-CM

## 2022-06-15 DIAGNOSIS — D591 Autoimmune hemolytic anemia, unspecified: Secondary | ICD-10-CM | POA: Diagnosis not present

## 2022-06-15 DIAGNOSIS — M7989 Other specified soft tissue disorders: Secondary | ICD-10-CM | POA: Diagnosis not present

## 2022-06-15 DIAGNOSIS — M8580 Other specified disorders of bone density and structure, unspecified site: Secondary | ICD-10-CM | POA: Diagnosis not present

## 2022-06-15 DIAGNOSIS — I480 Paroxysmal atrial fibrillation: Secondary | ICD-10-CM | POA: Diagnosis not present

## 2022-06-15 LAB — RETIC PANEL
Immature Retic Fract: 11 % (ref 2.3–15.9)
RBC.: 2.63 MIL/uL — ABNORMAL LOW (ref 3.87–5.11)
Retic Count, Absolute: 93.1 10*3/uL (ref 19.0–186.0)
Retic Ct Pct: 3.5 % — ABNORMAL HIGH (ref 0.4–3.1)
Reticulocyte Hemoglobin: 32.7 pg (ref >27.9)

## 2022-06-15 LAB — PROTIME-INR
INR: 2.4 — ABNORMAL HIGH (ref 0.8–1.2)
Prothrombin Time: 25.8 seconds — ABNORMAL HIGH (ref 11.4–15.2)

## 2022-06-15 LAB — CBC WITH DIFFERENTIAL/PLATELET
Abs Immature Granulocytes: 0.04 10*3/uL (ref 0.00–0.07)
Basophils Absolute: 0 10*3/uL (ref 0.0–0.1)
Basophils Relative: 0 %
Eosinophils Absolute: 0 10*3/uL (ref 0.0–0.5)
Eosinophils Relative: 1 %
HCT: 24.7 % — ABNORMAL LOW (ref 36.0–46.0)
Hemoglobin: 8.2 g/dL — ABNORMAL LOW (ref 12.0–15.0)
Immature Granulocytes: 1 %
Lymphocytes Relative: 9 %
Lymphs Abs: 0.6 10*3/uL — ABNORMAL LOW (ref 0.7–4.0)
MCH: 31.5 pg (ref 26.0–34.0)
MCHC: 33.2 g/dL (ref 30.0–36.0)
MCV: 95 fL (ref 80.0–100.0)
Monocytes Absolute: 0.3 10*3/uL (ref 0.1–1.0)
Monocytes Relative: 5 %
Neutro Abs: 5.2 10*3/uL (ref 1.7–7.7)
Neutrophils Relative %: 84 %
Platelets: 110 10*3/uL — ABNORMAL LOW (ref 150–400)
RBC: 2.6 MIL/uL — ABNORMAL LOW (ref 3.87–5.11)
RDW: 17.4 % — ABNORMAL HIGH (ref 11.5–15.5)
WBC: 6.1 10*3/uL (ref 4.0–10.5)
nRBC: 0 % (ref 0.0–0.2)

## 2022-06-15 LAB — CMP (CANCER CENTER ONLY)
ALT: 35 U/L (ref 0–44)
AST: 36 U/L (ref 15–41)
Albumin: 3.2 g/dL — ABNORMAL LOW (ref 3.5–5.0)
Alkaline Phosphatase: 81 U/L (ref 38–126)
Anion gap: 5 (ref 5–15)
BUN: 77 mg/dL — ABNORMAL HIGH (ref 8–23)
CO2: 20 mmol/L — ABNORMAL LOW (ref 22–32)
Calcium: 8.3 mg/dL — ABNORMAL LOW (ref 8.9–10.3)
Chloride: 108 mmol/L (ref 98–111)
Creatinine: 1.9 mg/dL — ABNORMAL HIGH (ref 0.44–1.00)
GFR, Estimated: 26 mL/min — ABNORMAL LOW (ref >60)
Glucose, Bld: 195 mg/dL — ABNORMAL HIGH (ref 70–99)
Potassium: 5 mmol/L (ref 3.5–5.1)
Sodium: 133 mmol/L — ABNORMAL LOW (ref 135–145)
Total Bilirubin: 0.5 mg/dL (ref 0.3–1.2)
Total Protein: 5.2 g/dL — ABNORMAL LOW (ref 6.5–8.1)

## 2022-06-15 LAB — SAMPLE TO BLOOD BANK

## 2022-06-15 NOTE — Assessment & Plan Note (Signed)
[name] is here today for f/u of her hemolytic anemia.  Her hemoglobin is 8.2 today and she is feeling well.  She will not require a blood transfusion today.  [name] will continue on Prednisone '20mg'$  daily.  She will return for f/u on 06/23/2022 for labs and possible transfusion.

## 2022-06-15 NOTE — Progress Notes (Signed)
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 [place] woman with multifactorial anemia, as follows:             (a) anemia of chronic inflammation: As of May 2020 [name] has a SED rate of 95, CRP 1.1, positive ANA and RF; subsequently [name] was found to be ANCA positive             (b) hemolysis (mild): [name] has a mildly elevated LDH and a DAT positive for complement, not IgG; this is c/w (a) above             (c) anemia of renal insufficiency: inadequate EPO resonse to anemia                         (d) GIB/ iron deficiency in patient on lifelong anticoagulaton   (1) Feraheme received 03/01/2019, repeated 03/20/2019              (a) reticulocyte 03/07/2019 up from 43.4 a year ago to 191.8 after iron infusion             (b) feraheme repeated on 05/12/2019             (c) subsequent ferritin levels >100   (2) darbepoetin: starting 05/05/2019 at 168mg given every 2 weeks             (a) dose increased to 200 mcg every week starting 07/01/2019              (b) back to every 2 weeks beginning 08/04/2019 still at 200 mcg dose             (c) switched to Retacrit every 4 weeks as of 08/18/2019   (3) ANCA positive vasculitis: diagnosed 05/08/2019 (titer 1:640)             (a) prednisone 40 mg daily started 05/31/2019             (b) rituximab weekly started 06/12/2019 (received test dose 06/05/2019)                         (i) hepatitis B sAg, core Ab and HIV negative 05/08/2019                         (ii) rituximab held after 07/14/2019 dose (received 5 weekly doses) with neutropenia                         (iii) rituximab resumed 08/18/2019 but changed to monthly                         (iv) rituximab changed to every 2 months beginning with 02/02/2020 dose                         (v) rituximab  changed to every 12 weeks after the 10/12/2020 dose             (c) prednisone tapered to off as of 09/15/2019    (4) Anemia worsened in spring 2023, refractory to Rituximab and requiring prednisone restart with slow taper due currently at '20mg'$  which [name] will  stay at.   (5) osteopenia with a T score of -1.5 on bone density 02/28/2016 at the [facility] center  CURRENT THERAPY: Prednisone  INTERVAL HISTORY: [name] 81 y.o. female returns for f/u of her hemolytic anemia.  [name] is accompanied by her son.  [name] is feeling quite well today and says [name] doesn't feel like [name] needs blood.  [name] denies any cough, shortness of breath, bowel/bladder changes, fever, chills or any other concerns.  [name] continues on prednisone with good tolerance.     Patient Active Problem List   Diagnosis Date Noted   Joint pain 06/09/2022   Parietoalveolar pneumopathy ([place]) 06/09/2022   Primary osteoarthritis 06/09/2022   Rheumatoid factor positive 06/09/2022   C. difficile colitis 04/19/2022   Colitis 04/18/2022   PAH (pulmonary artery hypertension) ([place]) 04/18/2022   Hypokalemia 04/15/2022   Colovesical fistula, ruled out 04/14/2022   Emphysematous cystitis 04/13/2022   Female bladder prolapse 04/13/2022   Hyperkalemia 02/04/2022   Acute renal failure superimposed on stage 3b chronic kidney disease ([place]) 02/04/2022   Hyperglycemia 01/09/2022   Autoimmune hemolytic anemia ([place]) 01/02/2022   Bilateral lower extremity edema 01/02/2022   Refractory anemia ([place]) 12/15/2021   Abnormal chest x-ray 12/15/2021   Subtherapeutic international normalized ratio (INR) 12/15/2021   Elevated troponin 12/15/2021   Unilateral primary osteoarthritis, right hip 05/31/2021   Midline cystocele 05/30/2021   Primary localized osteoarthritis of pelvic region and thigh 05/30/2021   Vaginal pain 05/30/2021   Asymmetric SNHL (sensorineural hearing loss) 01/06/2021   Referred otalgia of both ears 01/06/2021   Bilateral hearing  loss 08/31/2020   Low back pain 02/20/2020   CKD (chronic kidney disease), stage IV ([place]) 10/13/2019   Leukopenia due to antineoplastic chemotherapy ([place]) 08/18/2019   Thrombophilia ([place]) 06/19/2019   Encounter for general adult medical examination without abnormal findings 06/13/2019   Arteritis ([place]) 06/06/2019   Hypertensive heart and chronic kidney disease with heart failure and stage 1 through stage 4 chronic kidney disease, or unspecified chronic kidney disease ([place]) 06/06/2019   Muscle weakness 06/06/2019   Pancytopenia ([place]) 05/28/2019   Dyspnea 05/28/2019   Vasculitis, ANCA positive 05/28/2019   Disorder of connective tissue ([place]) 05/14/2019   Renal insufficiency 05/08/2019   Acute on chronic diastolic CHF (congestive heart failure) ([place]) 04/07/2019   Left leg cellulitis 04/07/2019   PAF (paroxysmal atrial fibrillation) ([place]) 04/07/2019   Iron deficiency anemia 02/28/2019   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 ([place]) 02/28/2019   Diabetic retinopathy associated with type 2 diabetes mellitus ([place]) 09/02/2018   Chronic pain 06/19/2018   Non-thrombocytopenic purpura ([place]) 05/21/2018   Gout 03/22/2018   Malnutrition of moderate degree 03/12/2018   Diabetes mellitus type 2, controlled ([place]) 03/06/2018   S/P cholecystectomy 03/06/2018   H/O mechanical aortic valve replacement 03/06/2018   Moderate to severe pulmonary hypertension ([place]) 03/06/2018   GERD (gastroesophageal reflux disease) 02/21/2018   Anxiety 02/21/2018   Chronic diastolic CHF (congestive heart failure) ([place]) 02/21/2018   Diarrhea 02/21/2018   General weakness 02/02/2018   Thrombocytopenia ([place]) 01/25/2018   Arm pain, diffuse 01/25/2018   Anemia 01/23/2018   Aortic atherosclerosis ([place]) 01/23/2018   GI bleed 01/18/2018   Age-related osteoporosis without current pathological fracture 05/24/2017   Carotid artery occlusion 05/24/2017   Generalized anxiety disorder 05/24/2017   Hearing  loss 05/24/2017   Presence of prosthetic heart valve 05/24/2017   Carotid artery disease ([place]) 08/18/2013   Peripheral arterial disease ([place]) 08/18/2013   Diabetes mellitus without  complication ([place]) 70/96/2836   Hyponatremia 10/06/2011   Hypertension    Hypercholesteremia    Mechanical heart valve present    Chronic anticoagulation     is allergic to flagyl [metronidazole], augmentin [amoxicillin-pot clavulanate], ciprofloxacin, coreg [carvedilol], diflucan [fluconazole], losartan, privigen [immune globulin (human)], sulfamethoxazole-trimethoprim, verapamil, zetia [ezetimibe], zocor [simvastatin - high dose], and gatifloxacin.  MEDICAL HISTORY: Past Medical History:  Diagnosis Date   Aortic atherosclerosis ([place]) 01/23/2018   Atrial fibrillation (HCC)    Benign positional vertigo 10/06/2011   Carotid artery disease ([place])    Chemotherapy induced neutropenia ([place]) 07/21/2019   Cholelithiasis 01/23/2018   Cholelithiasis 01/23/2018   Chronic anticoagulation    Coronary artery disease    status post coronary artery bypass grafting times 07/10/2004   Diabetes mellitus    Hypercholesteremia    Hypertension    Mechanical heart valve present    H. aortic valve replacement at the time of bypass surgery October 2005   Moderate to severe pulmonary hypertension ([place])    Peripheral arterial disease ([place])    history of left common iliac artery PTA and stenting for a chronic total occlusion 08/26/01    SURGICAL HISTORY: Past Surgical History:  Procedure Laterality Date   anterior repair  2009   AORTIC VALVE REPLACEMENT  2005   Dr. Cyndia Bent   CARDIAC CATHETERIZATION  11/10/2004   40% right common illiac, 70% in stent restenosis of distal left common illiac,    CARDIAC CATHETERIZATION  05/18/2004   LAD 50-70% midstenosis, RCA dominant w/50% stenosis, 50% Right common Illiac artery ostial stenosis, 90% in stent restenosis within midportion of left common illiac stent   Carotid Duplex   03/12/2012   RSA-elev. velocities suggestive of a 50-69% diameter reduction, Right&Left Bulb/Prox ICA-mild-mod.fibrous plaqueelevating Velocities abnormal study.   CHOLECYSTECTOMY N/A 03/01/2018   Procedure: LAPAROSCOPIC CHOLECYSTECTOMY;  Surgeon: Judeth Horn, MD;  Location: [facility];  Service: General;  Laterality: N/A;   COLONOSCOPY WITH PROPOFOL N/A 01/22/2018   Procedure: COLONOSCOPY WITH PROPOFOL;  Surgeon: Wilford Corner, MD;  Location: [facility];  Service: Endoscopy;  Laterality: N/A;   ESOPHAGOGASTRODUODENOSCOPY (EGD) WITH PROPOFOL N/A 01/22/2018   Procedure: ESOPHAGOGASTRODUODENOSCOPY (EGD) WITH PROPOFOL;  Surgeon: Wilford Corner, MD;  Location: [facility];  Service: Endoscopy;  Laterality: N/A;   ESOPHAGOGASTRODUODENOSCOPY (EGD) WITH PROPOFOL N/A 11/21/2018   Procedure: ESOPHAGOGASTRODUODENOSCOPY (EGD) WITH PROPOFOL;  Surgeon: [name], MD;  Location: [facility];  Service: Gastroenterology;  Laterality: N/A;   ESOPHAGOGASTRODUODENOSCOPY (EGD) WITH PROPOFOL Left 02/27/2019   Procedure: ESOPHAGOGASTRODUODENOSCOPY (EGD) WITH PROPOFOL;  Surgeon: Arta Silence, MD;  Location: [facility] END[facility];  Service: Endoscopy;  Laterality: Left;   GIVENS CAPSULE STUDY N/A 11/21/2018   Procedure: GIVENS CAPSULE STUDY;  Surgeon: [name], MD;  Location: [facility];  Service: Gastroenterology;  Laterality: N/A;  To be deployed during EGD   Lower Ext. Duplex  03/12/2012   Right Proximal CIA- vessel narrowing w/elevated velocities 0-49% diameter reduction. Right SFA-mild mixed density plaque throughout vessel.   NM MYOCAR PERF WALL MOTION  05/19/2010   protocol: Persantine, post stress EF 65%, negative for ischemia, low risk scan   RIGHT HEART CATH N/A 06/27/2018   Procedure: RIGHT HEART CATH;  Surgeon: [name], MD;  Location: [facility];  Service: Cardiovascular;  Laterality: N/A;   TOTAL ABDOMINAL HYSTERECTOMY W/ BILATERAL SALPINGOOPHORECTOMY  1989   TRANSTHORACIC ECHOCARDIOGRAM   08/29/2012   Moderately calcified annulus of mitral valve, moderate regurg. of both mitral valve and tricuspid valve.     SOCIAL  HISTORY: Social History   Socioeconomic History   Marital status: Widowed    Spouse name: Not on file   Number of children: Not on file   Years of education: Not on file   Highest education level: Not on file  Occupational History   Occupation: Retired  Tobacco Use   Smoking status: Former    Packs/day: 1.00    Years: 30.00    Total pack years: 30.00    Types: Cigarettes   Smokeless tobacco: Never   Tobacco comments:    quit smoking 2005  Vaping Use   Vaping Use: Never used  Substance and Sexual Activity   Alcohol use: No    Alcohol/week: 0.0 standard drinks of alcohol   Drug use: No   Sexual activity: Not Currently    Birth control/protection: None  Other Topics Concern   Not on file  Social History Narrative   Not on file   Social Determinants of Health   Financial Resource Strain: Low Risk  (02/28/2022)   Overall Financial Resource Strain (CARDIA)    Difficulty of Paying Living Expenses: Not hard at all  Food Insecurity: No Food Insecurity (01/13/2022)   Hunger Vital Sign    Worried About Running Out of Food in the Last Year: Never true    [place] in the Last Year: Never true  Transportation Needs: No Transportation Needs (05/12/2022)   PRAPARE - Hydrologist (Medical): No    Lack of Transportation (Non-Medical): No  Physical Activity: Inactive (02/28/2022)   Exercise Vital Sign    Days of Exercise per Week: 0 days    Minutes of Exercise per Session: 0 min  Stress: No Stress Concern Present (02/28/2022)   [place]    Feeling of Stress : Only a little  Social Connections: Socially Isolated (11/25/2019)   Social Connection and Isolation Panel [NHANES]    Frequency of Communication with Friends and Family: More than three times a week     Frequency of Social Gatherings with Friends and Family: Once a week    Attends Religious Services: Never    Marine scientist or Organizations: No    Attends Archivist Meetings: Never    Marital Status: Widowed  Intimate Partner Violence: Not At Risk (02/28/2022)   Humiliation, Afraid, Rape, and Kick questionnaire    Fear of Current or Ex-Partner: No    Emotionally Abused: No    Physically Abused: No    Sexually Abused: No    FAMILY HISTORY: Family History  Problem Relation Age of Onset   Heart disease Father    Hypertension Father    Hyperlipidemia Father    Breast cancer Neg Hx     Review of Systems  Constitutional:  Negative for appetite change, chills, fatigue, fever and unexpected weight change.  HENT:   Negative for hearing loss, lump/mass and trouble swallowing.   Eyes:  Negative for eye problems and icterus.  Respiratory:  Negative for chest tightness, cough and shortness of breath.   Cardiovascular:  Negative for chest pain, leg swelling and palpitations.  Gastrointestinal:  Negative for abdominal distention, abdominal pain, constipation, diarrhea, nausea and vomiting.  Endocrine: Negative for hot flashes.  Genitourinary:  Negative for difficulty urinating.   Musculoskeletal:  Negative for arthralgias.  Skin:  Negative for itching and rash.  Neurological:  Negative for dizziness, extremity weakness, headaches and numbness.  Hematological:  Negative for adenopathy.  Does not bruise/bleed easily.  Psychiatric/Behavioral:  Negative for depression. The patient is not nervous/anxious.      PHYSICAL EXAMINATION  ECOG PERFORMANCE STATUS: 1 - Symptomatic but completely ambulatory  Vitals:   06/15/22 1118  BP: (!) 140/52  Pulse: 67  Resp: 18  Temp: 98.1 F (36.7 C)  SpO2: 100%    Physical Exam Vitals reviewed: examined in wheelchair.  Constitutional:      General: [name] is not in acute distress.    Appearance: Normal appearance. [name] is not  toxic-appearing.  HENT:     Head: Normocephalic and atraumatic.  Eyes:     General: No scleral icterus. Cardiovascular:     Rate and Rhythm: Normal rate and regular rhythm.     Pulses: Normal pulses.     Heart sounds: Normal heart sounds.  Pulmonary:     Effort: Pulmonary effort is normal.     Breath sounds: Normal breath sounds.  Abdominal:     General: Abdomen is flat. Bowel sounds are normal. There is no distension.     Palpations: Abdomen is soft.     Tenderness: There is no abdominal tenderness.  Musculoskeletal:        General: Swelling (at baseline) present.     Cervical back: Neck supple.  Lymphadenopathy:     Cervical: No cervical adenopathy.  Skin:    General: Skin is warm and dry.     Findings: No rash.  Neurological:     General: No focal deficit present.     Mental Status: [name] is alert.  Psychiatric:        Mood and Affect: Mood normal.        Behavior: Behavior normal.     LABORATORY DATA:  CBC    Component Value Date/Time   WBC 6.1 06/15/2022 1049   RBC 2.60 (L) 06/15/2022 1049   RBC 2.63 (L) 06/15/2022 1049   HGB 8.2 (L) 06/15/2022 1049   HGB 7.8 (L) 05/25/2022 1131   HCT 24.7 (L) 06/15/2022 1049   HCT 28.5 (L) 11/20/2018 1824   PLT 110 (L) 06/15/2022 1049   PLT 105 (L) 05/25/2022 1131   MCV 95.0 06/15/2022 1049   MCH 31.5 06/15/2022 1049   MCHC 33.2 06/15/2022 1049   RDW 17.4 (H) 06/15/2022 1049   LYMPHSABS 0.6 (L) 06/15/2022 1049   MONOABS 0.3 06/15/2022 1049   EOSABS 0.0 06/15/2022 1049   BASOSABS 0.0 06/15/2022 1049    CMP     Component Value Date/Time   NA 133 (L) 06/15/2022 1049   NA 137 12/06/2017 1436   K 5.0 06/15/2022 1049   CL 108 06/15/2022 1049   CO2 20 (L) 06/15/2022 1049   GLUCOSE 195 (H) 06/15/2022 1049   BUN 77 (H) 06/15/2022 1049   BUN 20 12/06/2017 1436   CREATININE 1.90 (H) 06/15/2022 1049   CALCIUM 8.3 (L) 06/15/2022 1049   PROT 5.2 (L) 06/15/2022 1049   ALBUMIN 3.2 (L) 06/15/2022 1049   AST 36 06/15/2022  1049   ALT 35 06/15/2022 1049   ALKPHOS 81 06/15/2022 1049   BILITOT 0.5 06/15/2022 1049   GFRNONAA 26 (L) 06/15/2022 1049   GFRAA 32 (L) 03/30/2020 0932      ASSESSMENT and THERAPY PLAN:   Autoimmune hemolytic anemia (HCC) [name] is here today for f/u of her hemolytic anemia.  Her hemoglobin is 8.2 today and [name] is feeling well.  [name] will not require a blood transfusion today.  [name] will continue on Prednisone '20mg'$  daily.  [name] will return for f/u on 06/23/2022 for labs and possible transfusion.     All questions were answered. The patient knows to call the clinic with any problems, questions or concerns. We can certainly see the patient much sooner if necessary.  Total encounter time:20 minutes*in face-to-face visit time, chart review, lab review, care coordination, order entry, and documentation of the encounter time.  Wilber Bihari, NP 06/15/22 10:27 PM Medical Oncology and Hematology [facility] [place], [place] [address] Tel. [phone]    Fax. [phone]  *Total Encounter Time as defined by the Centers for Medicare and Medicaid Services includes, in addition to the face-to-face time of a patient visit (documented in the note above) non-face-to-face time: obtaining and reviewing outside history, ordering and reviewing medications, tests or procedures, care coordination (communications with other health care professionals or caregivers) and documentation in the medical record.

## 2022-06-16 ENCOUNTER — Ambulatory Visit (INDEPENDENT_AMBULATORY_CARE_PROVIDER_SITE_OTHER): Payer: Medicare HMO | Admitting: Cardiovascular Disease

## 2022-06-16 ENCOUNTER — Other Ambulatory Visit: Payer: Self-pay | Admitting: *Deleted

## 2022-06-16 DIAGNOSIS — Z5181 Encounter for therapeutic drug level monitoring: Secondary | ICD-10-CM

## 2022-06-16 DIAGNOSIS — M109 Gout, unspecified: Secondary | ICD-10-CM | POA: Diagnosis not present

## 2022-06-16 DIAGNOSIS — N184 Chronic kidney disease, stage 4 (severe): Secondary | ICD-10-CM | POA: Diagnosis not present

## 2022-06-16 DIAGNOSIS — Z7901 Long term (current) use of anticoagulants: Secondary | ICD-10-CM

## 2022-06-16 DIAGNOSIS — Z952 Presence of prosthetic heart valve: Secondary | ICD-10-CM

## 2022-06-16 DIAGNOSIS — D594 Other nonautoimmune hemolytic anemias: Secondary | ICD-10-CM

## 2022-06-16 DIAGNOSIS — I5032 Chronic diastolic (congestive) heart failure: Secondary | ICD-10-CM | POA: Diagnosis not present

## 2022-06-16 DIAGNOSIS — I129 Hypertensive chronic kidney disease with stage 1 through stage 4 chronic kidney disease, or unspecified chronic kidney disease: Secondary | ICD-10-CM | POA: Diagnosis not present

## 2022-06-16 DIAGNOSIS — D631 Anemia in chronic kidney disease: Secondary | ICD-10-CM | POA: Diagnosis not present

## 2022-06-16 DIAGNOSIS — I7782 Antineutrophilic cytoplasmic antibody (ANCA) vasculitis: Secondary | ICD-10-CM | POA: Diagnosis not present

## 2022-06-16 DIAGNOSIS — E1122 Type 2 diabetes mellitus with diabetic chronic kidney disease: Secondary | ICD-10-CM | POA: Diagnosis not present

## 2022-06-16 MED ORDER — PREDNISONE 10 MG PO TABS: 20.0000 mg | ORAL_TABLET | Freq: Every day | ORAL | 0 refills | Status: DC

## 2022-06-22 ENCOUNTER — Emergency Department (HOSPITAL_COMMUNITY): Payer: Medicare HMO

## 2022-06-22 ENCOUNTER — Inpatient Hospital Stay: Payer: Medicare HMO | Admitting: Hematology and Oncology

## 2022-06-22 ENCOUNTER — Other Ambulatory Visit: Payer: Self-pay

## 2022-06-22 ENCOUNTER — Inpatient Hospital Stay: Payer: Medicare HMO

## 2022-06-22 ENCOUNTER — Inpatient Hospital Stay (HOSPITAL_COMMUNITY)
Admission: EM | Admit: 2022-06-22 | Discharge: 2022-06-25 | DRG: 872 | Disposition: A | Discharge status: Home / Self Care | Payer: Medicare HMO | Attending: Internal Medicine | Admitting: Internal Medicine

## 2022-06-22 ENCOUNTER — Encounter (HOSPITAL_COMMUNITY): Payer: Self-pay

## 2022-06-22 DIAGNOSIS — Z87891 Personal history of nicotine dependence: Secondary | ICD-10-CM | POA: Diagnosis not present

## 2022-06-22 DIAGNOSIS — A419 Sepsis, unspecified organism: Principal | ICD-10-CM | POA: Diagnosis present

## 2022-06-22 DIAGNOSIS — I700 Atherosclerosis of aorta: Secondary | ICD-10-CM | POA: Diagnosis not present

## 2022-06-22 DIAGNOSIS — B9620 Unspecified Escherichia coli [E. coli] as the cause of diseases classified elsewhere: Secondary | ICD-10-CM | POA: Diagnosis present

## 2022-06-22 DIAGNOSIS — E1122 Type 2 diabetes mellitus with diabetic chronic kidney disease: Secondary | ICD-10-CM | POA: Diagnosis present

## 2022-06-22 DIAGNOSIS — R197 Diarrhea, unspecified: Secondary | ICD-10-CM | POA: Diagnosis present

## 2022-06-22 DIAGNOSIS — E1151 Type 2 diabetes mellitus with diabetic peripheral angiopathy without gangrene: Secondary | ICD-10-CM | POA: Diagnosis present

## 2022-06-22 DIAGNOSIS — N184 Chronic kidney disease, stage 4 (severe): Secondary | ICD-10-CM | POA: Diagnosis present

## 2022-06-22 DIAGNOSIS — E119 Type 2 diabetes mellitus without complications: Secondary | ICD-10-CM

## 2022-06-22 DIAGNOSIS — I130 Hypertensive heart and chronic kidney disease with heart failure and stage 1 through stage 4 chronic kidney disease, or unspecified chronic kidney disease: Secondary | ICD-10-CM | POA: Diagnosis not present

## 2022-06-22 DIAGNOSIS — R109 Unspecified abdominal pain: Secondary | ICD-10-CM | POA: Diagnosis not present

## 2022-06-22 DIAGNOSIS — D509 Iron deficiency anemia, unspecified: Secondary | ICD-10-CM | POA: Diagnosis present

## 2022-06-22 DIAGNOSIS — L8990 Pressure ulcer of unspecified site, unspecified stage: Secondary | ICD-10-CM | POA: Diagnosis present

## 2022-06-22 DIAGNOSIS — Z7901 Long term (current) use of anticoagulants: Secondary | ICD-10-CM

## 2022-06-22 DIAGNOSIS — E872 Acidosis, unspecified: Secondary | ICD-10-CM | POA: Diagnosis not present

## 2022-06-22 DIAGNOSIS — Z8249 Family history of ischemic heart disease and other diseases of the circulatory system: Secondary | ICD-10-CM

## 2022-06-22 DIAGNOSIS — Z83438 Family history of other disorder of lipoprotein metabolism and other lipidemia: Secondary | ICD-10-CM

## 2022-06-22 DIAGNOSIS — Z952 Presence of prosthetic heart valve: Secondary | ICD-10-CM | POA: Diagnosis not present

## 2022-06-22 DIAGNOSIS — I776 Arteritis, unspecified: Secondary | ICD-10-CM | POA: Diagnosis present

## 2022-06-22 DIAGNOSIS — R319 Hematuria, unspecified: Secondary | ICD-10-CM | POA: Diagnosis not present

## 2022-06-22 DIAGNOSIS — K5792 Diverticulitis of intestine, part unspecified, without perforation or abscess without bleeding: Secondary | ICD-10-CM

## 2022-06-22 DIAGNOSIS — B9689 Other specified bacterial agents as the cause of diseases classified elsewhere: Secondary | ICD-10-CM | POA: Diagnosis present

## 2022-06-22 DIAGNOSIS — Z66 Do not resuscitate: Secondary | ICD-10-CM | POA: Diagnosis present

## 2022-06-22 DIAGNOSIS — I480 Paroxysmal atrial fibrillation: Secondary | ICD-10-CM | POA: Diagnosis present

## 2022-06-22 DIAGNOSIS — Z87440 Personal history of urinary (tract) infections: Secondary | ICD-10-CM

## 2022-06-22 DIAGNOSIS — N181 Chronic kidney disease, stage 1: Secondary | ICD-10-CM | POA: Diagnosis not present

## 2022-06-22 DIAGNOSIS — K219 Gastro-esophageal reflux disease without esophagitis: Secondary | ICD-10-CM | POA: Diagnosis not present

## 2022-06-22 DIAGNOSIS — Z20822 Contact with and (suspected) exposure to covid-19: Secondary | ICD-10-CM | POA: Diagnosis present

## 2022-06-22 DIAGNOSIS — I10 Essential (primary) hypertension: Secondary | ICD-10-CM | POA: Diagnosis present

## 2022-06-22 DIAGNOSIS — F419 Anxiety disorder, unspecified: Secondary | ICD-10-CM | POA: Diagnosis present

## 2022-06-22 DIAGNOSIS — Z9049 Acquired absence of other specified parts of digestive tract: Secondary | ICD-10-CM

## 2022-06-22 DIAGNOSIS — N390 Urinary tract infection, site not specified: Secondary | ICD-10-CM | POA: Diagnosis not present

## 2022-06-22 DIAGNOSIS — I159 Secondary hypertension, unspecified: Secondary | ICD-10-CM | POA: Diagnosis not present

## 2022-06-22 DIAGNOSIS — Z608 Other problems related to social environment: Secondary | ICD-10-CM | POA: Diagnosis present

## 2022-06-22 DIAGNOSIS — I5032 Chronic diastolic (congestive) heart failure: Secondary | ICD-10-CM | POA: Diagnosis present

## 2022-06-22 DIAGNOSIS — I7782 Antineutrophilic cytoplasmic antibody (ANCA) vasculitis: Secondary | ICD-10-CM | POA: Diagnosis present

## 2022-06-22 DIAGNOSIS — I2721 Secondary pulmonary arterial hypertension: Secondary | ICD-10-CM | POA: Diagnosis not present

## 2022-06-22 DIAGNOSIS — K5732 Diverticulitis of large intestine without perforation or abscess without bleeding: Secondary | ICD-10-CM | POA: Diagnosis present

## 2022-06-22 DIAGNOSIS — Z951 Presence of aortocoronary bypass graft: Secondary | ICD-10-CM

## 2022-06-22 DIAGNOSIS — L89151 Pressure ulcer of sacral region, stage 1: Secondary | ICD-10-CM | POA: Diagnosis present

## 2022-06-22 DIAGNOSIS — E7800 Pure hypercholesterolemia, unspecified: Secondary | ICD-10-CM | POA: Diagnosis present

## 2022-06-22 DIAGNOSIS — Z8619 Personal history of other infectious and parasitic diseases: Secondary | ICD-10-CM

## 2022-06-22 DIAGNOSIS — R1084 Generalized abdominal pain: Secondary | ICD-10-CM | POA: Diagnosis not present

## 2022-06-22 DIAGNOSIS — I2510 Atherosclerotic heart disease of native coronary artery without angina pectoris: Secondary | ICD-10-CM | POA: Diagnosis present

## 2022-06-22 DIAGNOSIS — Z79899 Other long term (current) drug therapy: Secondary | ICD-10-CM | POA: Diagnosis not present

## 2022-06-22 LAB — URINALYSIS, ROUTINE W REFLEX MICROSCOPIC
Bilirubin Urine: NEGATIVE
Glucose, UA: NEGATIVE mg/dL
Ketones, ur: NEGATIVE mg/dL
Nitrite: NEGATIVE
Protein, ur: 100 mg/dL — AB
RBC / HPF: >50 RBC/hpf — ABNORMAL HIGH (ref 0–5)
Specific Gravity, Urine: 1.01 (ref 1.005–1.030)
WBC, UA: >50 WBC/hpf — ABNORMAL HIGH (ref 0–5)
pH: 5 (ref 5.0–8.0)

## 2022-06-22 LAB — LACTIC ACID, PLASMA
Lactic Acid, Venous: 1.3 mmol/L (ref 0.5–1.9)
Lactic Acid, Venous: 1.6 mmol/L (ref 0.5–1.9)

## 2022-06-22 LAB — CBC WITH DIFFERENTIAL/PLATELET
Abs Immature Granulocytes: 0.04 10*3/uL (ref 0.00–0.07)
Basophils Absolute: 0 10*3/uL (ref 0.0–0.1)
Basophils Relative: 0 %
Eosinophils Absolute: 0 10*3/uL (ref 0.0–0.5)
Eosinophils Relative: 0 %
HCT: 31.5 % — ABNORMAL LOW (ref 36.0–46.0)
Hemoglobin: 10.3 g/dL — ABNORMAL LOW (ref 12.0–15.0)
Immature Granulocytes: 0 %
Lymphocytes Relative: 12 %
Lymphs Abs: 1.3 10*3/uL (ref 0.7–4.0)
MCH: 31.7 pg (ref 26.0–34.0)
MCHC: 32.7 g/dL (ref 30.0–36.0)
MCV: 96.9 fL (ref 80.0–100.0)
Monocytes Absolute: 0.6 10*3/uL (ref 0.1–1.0)
Monocytes Relative: 6 %
Neutro Abs: 8.7 10*3/uL — ABNORMAL HIGH (ref 1.7–7.7)
Neutrophils Relative %: 82 %
Platelets: 150 10*3/uL (ref 150–400)
RBC: 3.25 MIL/uL — ABNORMAL LOW (ref 3.87–5.11)
RDW: 16.8 % — ABNORMAL HIGH (ref 11.5–15.5)
WBC: 10.7 10*3/uL — ABNORMAL HIGH (ref 4.0–10.5)
nRBC: 0 % (ref 0.0–0.2)

## 2022-06-22 LAB — COMPREHENSIVE METABOLIC PANEL
ALT: 48 U/L — ABNORMAL HIGH (ref 0–44)
AST: 51 U/L — ABNORMAL HIGH (ref 15–41)
Albumin: 3.4 g/dL — ABNORMAL LOW (ref 3.5–5.0)
Alkaline Phosphatase: 99 U/L (ref 38–126)
Anion gap: 9 (ref 5–15)
BUN: 76 mg/dL — ABNORMAL HIGH (ref 8–23)
CO2: 17 mmol/L — ABNORMAL LOW (ref 22–32)
Calcium: 8.7 mg/dL — ABNORMAL LOW (ref 8.9–10.3)
Chloride: 109 mmol/L (ref 98–111)
Creatinine, Ser: 2.18 mg/dL — ABNORMAL HIGH (ref 0.44–1.00)
GFR, Estimated: 22 mL/min — ABNORMAL LOW (ref >60)
Glucose, Bld: 125 mg/dL — ABNORMAL HIGH (ref 70–99)
Potassium: 4.7 mmol/L (ref 3.5–5.1)
Sodium: 135 mmol/L (ref 135–145)
Total Bilirubin: 0.7 mg/dL (ref 0.3–1.2)
Total Protein: 6 g/dL — ABNORMAL LOW (ref 6.5–8.1)

## 2022-06-22 LAB — IRON AND TIBC
Iron: 8 ug/dL — ABNORMAL LOW (ref 28–170)
Saturation Ratios: 3 % — ABNORMAL LOW (ref 10.4–31.8)
TIBC: 267 ug/dL (ref 250–450)
UIBC: 259 ug/dL

## 2022-06-22 LAB — LIPASE, BLOOD: Lipase: 66 U/L — ABNORMAL HIGH (ref 11–51)

## 2022-06-22 LAB — PROTIME-INR
INR: 2.3 — ABNORMAL HIGH (ref 0.8–1.2)
Prothrombin Time: 24.8 seconds — ABNORMAL HIGH (ref 11.4–15.2)

## 2022-06-22 LAB — APTT: aPTT: 36 seconds (ref 24–36)

## 2022-06-22 LAB — GLUCOSE, CAPILLARY
Glucose-Capillary: 147 mg/dL — ABNORMAL HIGH (ref 70–99)
Glucose-Capillary: 161 mg/dL — ABNORMAL HIGH (ref 70–99)

## 2022-06-22 MED ORDER — WARFARIN - PHARMACIST DOSING INPATIENT: Freq: Every day | Status: DC

## 2022-06-22 MED ORDER — SODIUM CHLORIDE 0.9 % IV SOLN
1.0000 g | Freq: Two times a day (BID) | INTRAVENOUS | Status: DC
  Administered 2022-06-22 – 2022-06-25 (×6): 1 g via INTRAVENOUS
  Filled 2022-06-22 (×7): qty 20

## 2022-06-22 MED ORDER — ALPRAZOLAM 0.25 MG PO TABS
0.2500 mg | ORAL_TABLET | Freq: Every day | ORAL | Status: DC
  Administered 2022-06-22: 0.25 mg via ORAL
  Administered 2022-06-23 – 2022-06-24 (×2): 0.5 mg via ORAL
  Filled 2022-06-22 (×3): qty 2

## 2022-06-22 MED ORDER — ASPIRIN 81 MG PO TBEC
81.0000 mg | DELAYED_RELEASE_TABLET | Freq: Every day | ORAL | Status: DC
  Administered 2022-06-22 – 2022-06-25 (×4): 81 mg via ORAL
  Filled 2022-06-22 (×4): qty 1

## 2022-06-22 MED ORDER — SODIUM CHLORIDE 0.9 % IV SOLN: 2.0000 g | Freq: Once | INTRAVENOUS | Status: DC

## 2022-06-22 MED ORDER — HYDRALAZINE HCL 50 MG PO TABS
100.0000 mg | ORAL_TABLET | Freq: Three times a day (TID) | ORAL | Status: DC
  Administered 2022-06-22 – 2022-06-25 (×10): 100 mg via ORAL
  Filled 2022-06-22 (×10): qty 2

## 2022-06-22 MED ORDER — SIMETHICONE 80 MG PO CHEW: 80.0000 mg | CHEWABLE_TABLET | Freq: Four times a day (QID) | ORAL | Status: DC | PRN

## 2022-06-22 MED ORDER — ACETAMINOPHEN 325 MG PO TABS
650.0000 mg | ORAL_TABLET | Freq: Once | ORAL | Status: AC
  Administered 2022-06-22: 650 mg via ORAL
  Filled 2022-06-22: qty 2

## 2022-06-22 MED ORDER — FOLIC ACID 1 MG PO TABS
1.0000 mg | ORAL_TABLET | Freq: Every day | ORAL | Status: DC
  Administered 2022-06-22 – 2022-06-25 (×4): 1 mg via ORAL
  Filled 2022-06-22 (×4): qty 1

## 2022-06-22 MED ORDER — SODIUM CHLORIDE 0.9 % IV SOLN: Freq: Once | INTRAVENOUS | Status: AC

## 2022-06-22 MED ORDER — SODIUM CHLORIDE 0.9 % IV SOLN
1.0000 g | Freq: Once | INTRAVENOUS | Status: AC
  Administered 2022-06-22: 1 g via INTRAVENOUS
  Filled 2022-06-22: qty 20

## 2022-06-22 MED ORDER — HYDROMORPHONE HCL 1 MG/ML IJ SOLN
0.5000 mg | Freq: Once | INTRAMUSCULAR | Status: AC
  Administered 2022-06-22: 0.5 mg via INTRAVENOUS
  Filled 2022-06-22: qty 1

## 2022-06-22 MED ORDER — INSULIN ASPART 100 UNIT/ML IJ SOLN
0.0000 [IU] | Freq: Three times a day (TID) | INTRAMUSCULAR | Status: DC
  Administered 2022-06-23: 1 [IU] via SUBCUTANEOUS
  Administered 2022-06-23: 2 [IU] via SUBCUTANEOUS
  Administered 2022-06-23: 3 [IU] via SUBCUTANEOUS
  Administered 2022-06-24 (×2): 1 [IU] via SUBCUTANEOUS
  Administered 2022-06-24: 3 [IU] via SUBCUTANEOUS
  Administered 2022-06-25: 1 [IU] via SUBCUTANEOUS
  Administered 2022-06-25: 4 [IU] via SUBCUTANEOUS
  Administered 2022-06-25: 5 [IU] via SUBCUTANEOUS
  Filled 2022-06-22: qty 0.06

## 2022-06-22 MED ORDER — ACETAMINOPHEN 325 MG PO TABS: 650.0000 mg | ORAL_TABLET | Freq: Four times a day (QID) | ORAL | Status: DC | PRN

## 2022-06-22 MED ORDER — OXYCODONE HCL 5 MG PO TABS
5.0000 mg | ORAL_TABLET | ORAL | Status: DC | PRN
  Administered 2022-06-22 – 2022-06-23 (×2): 5 mg via ORAL
  Filled 2022-06-22 (×2): qty 1

## 2022-06-22 MED ORDER — CLONIDINE HCL 0.1 MG PO TABS
0.2000 mg | ORAL_TABLET | Freq: Two times a day (BID) | ORAL | Status: DC
  Administered 2022-06-22 – 2022-06-25 (×6): 0.2 mg via ORAL
  Filled 2022-06-22 (×6): qty 2

## 2022-06-22 MED ORDER — PREDNISONE 20 MG PO TABS
20.0000 mg | ORAL_TABLET | Freq: Every day | ORAL | Status: DC
  Administered 2022-06-22 – 2022-06-25 (×4): 20 mg via ORAL
  Filled 2022-06-22 (×4): qty 1

## 2022-06-22 MED ORDER — PROCHLORPERAZINE EDISYLATE 10 MG/2ML IJ SOLN
10.0000 mg | Freq: Four times a day (QID) | INTRAMUSCULAR | Status: DC | PRN
  Administered 2022-06-22: 10 mg via INTRAVENOUS
  Filled 2022-06-22: qty 2

## 2022-06-22 MED ORDER — AMLODIPINE BESYLATE 5 MG PO TABS
10.0000 mg | ORAL_TABLET | Freq: Every day | ORAL | Status: DC
  Administered 2022-06-22 – 2022-06-25 (×4): 10 mg via ORAL
  Filled 2022-06-22 (×4): qty 2

## 2022-06-22 MED ORDER — ALLOPURINOL 100 MG PO TABS
100.0000 mg | ORAL_TABLET | Freq: Every day | ORAL | Status: DC
  Administered 2022-06-22 – 2022-06-25 (×4): 100 mg via ORAL
  Filled 2022-06-22 (×4): qty 1

## 2022-06-22 MED ORDER — ACETAMINOPHEN 650 MG RE SUPP: 650.0000 mg | Freq: Four times a day (QID) | RECTAL | Status: DC | PRN

## 2022-06-22 MED ORDER — METOPROLOL SUCCINATE ER 25 MG PO TB24
12.5000 mg | ORAL_TABLET | Freq: Every day | ORAL | Status: DC
  Administered 2022-06-23 – 2022-06-25 (×3): 12.5 mg via ORAL
  Filled 2022-06-22 (×3): qty 1

## 2022-06-22 MED ORDER — SODIUM BICARBONATE 650 MG PO TABS
650.0000 mg | ORAL_TABLET | Freq: Three times a day (TID) | ORAL | Status: DC
  Administered 2022-06-22 – 2022-06-25 (×10): 650 mg via ORAL
  Filled 2022-06-22 (×10): qty 1

## 2022-06-22 MED ORDER — VITAMIN B-12 1000 MCG PO TABS
1000.0000 ug | ORAL_TABLET | Freq: Every day | ORAL | Status: DC
  Administered 2022-06-22 – 2022-06-25 (×4): 1000 ug via ORAL
  Filled 2022-06-22 (×4): qty 1

## 2022-06-22 MED ORDER — WARFARIN SODIUM 5 MG PO TABS
5.0000 mg | ORAL_TABLET | Freq: Once | ORAL | Status: AC
  Administered 2022-06-22: 5 mg via ORAL
  Filled 2022-06-22: qty 1

## 2022-06-22 MED ORDER — TADALAFIL 20 MG PO TABS
40.0000 mg | ORAL_TABLET | Freq: Every day | ORAL | Status: DC
  Administered 2022-06-22 – 2022-06-24 (×3): 40 mg via ORAL
  Filled 2022-06-22 (×4): qty 2

## 2022-06-22 MED ORDER — METRONIDAZOLE 500 MG/100ML IV SOLN: 500.0000 mg | Freq: Once | INTRAVENOUS | Status: DC

## 2022-06-22 NOTE — ED Notes (Signed)
Patient transported to CT 

## 2022-06-22 NOTE — Progress Notes (Signed)
Pharmacy Antibiotic Note  [name] is a 81 y.o. female admitted on 06/22/2022 with UTI.  Pharmacy has been consulted for meropenem dosing given recent ESBL in urine culture.  Plan: Meropenem 1g IV q12h Follow up renal function & cultures  Height: '4\' 11"'$  (149.9 cm) Weight: 64.4 kg (142 lb) IBW/kg (Calculated) : 43.2  Temp (24hrs), Avg:99.9 F (37.7 C), Min:98.6 F (37 C), Max:101.8 F (38.8 C)  Recent Labs  Lab 06/22/22 0955 06/22/22 1036  WBC 10.7*  --   CREATININE 2.18*  --   LATICACIDVEN  --  1.3    Estimated Creatinine Clearance: 16 mL/min (A) (by C-G formula based on SCr of 2.18 mg/dL (H)).    Allergies  Allergen Reactions   Flagyl [Metronidazole] Rash and Other (See Comments)    ALL-OVER BODY RASH   Augmentin [Amoxicillin-Pot Clavulanate] Diarrhea and Nausea And Vomiting   Ciprofloxacin Nausea Only and Other (See Comments)    Stomach ache   Coreg [Carvedilol] Other (See Comments)    Terrible cramping in the feet and had a lot of bowel movements, but not diarrhea   Diflucan [Fluconazole] Diarrhea   Losartan Swelling and Other (See Comments)    Patient doesn't recall site of swelling   Privigen [Immune Globulin (Human)] Other (See Comments)    Severe/excruciating pain   Sulfamethoxazole-Trimethoprim Nausea Only and Other (See Comments)    Stomach upset   Verapamil Hives   Zetia [Ezetimibe] Other (See Comments)    Reaction not recalled   Zocor [Simvastatin - High Dose] Other (See Comments)    Reaction not recalled   Gatifloxacin Rash and Other (See Comments)    Redness to skin around eye    Antimicrobials this admission: 9/14 Meropenem >>  Dose adjustments this admission:  Microbiology results: 9/14 BCx: 9/14 UCx: 9/14 C.diff  Thank you for allowing pharmacy to be a part of this patient's care.  Peggyann Juba, PharmD, BCPS Pharmacy: [phone] 06/22/2022 1:15 PM

## 2022-06-22 NOTE — Sepsis Progress Note (Signed)
eLink is following this Code Sepsis. °

## 2022-06-22 NOTE — ED Triage Notes (Signed)
Pt arrives via [facility] from home for chief complain of lower abdominal pain and nausea. Pt states she was recently seen and admitted for UTI and later developed C. Diff.

## 2022-06-22 NOTE — Telephone Encounter (Signed)
No entry 

## 2022-06-22 NOTE — ED Provider Notes (Signed)
[facility] DEPT Provider Note   CSN: [id number] Arrival date & time: 06/22/22  9563     History  Chief Complaint  Patient presents with   Abdominal Pain   Nausea    [name] is a 81 y.o. female.  HPI 81 year old female presents with sudden onset lower abdominal pain.  Started last night/in the middle of the night.  It is lower abdominal and bilateral and severe.  No back complaints.  She was told she had a low-grade temperature of 99.2 here but did not know she had a fever.  She has been having on and off dysuria and on and off hematuria for a while but its not new or worse.  She no longer has a Foley catheter.  During her last admission in July she was treated for C. difficile but states she continues to have diarrhea every day.  No blood in the stools.  Home Medications Prior to Admission medications   Medication Sig Start Date End Date Taking? Authorizing Provider  acetaminophen (TYLENOL) 325 MG tablet Take 650 mg by mouth every 6 (six) hours as needed for mild pain or fever.   Yes [provider]  alendronate (FOSAMAX) 70 MG tablet Take 70 mg by mouth every Saturday. 12/06/21  Yes [provider]  allopurinol (ZYLOPRIM) 100 MG tablet Take 100 mg by mouth daily.   Yes [provider]  ALPRAZolam (XANAX) 0.5 MG tablet Take 0.25-0.5 mg by mouth at bedtime. 07/27/21  Yes [provider]  amLODipine (NORVASC) 10 MG tablet TAKE 1 TABLET EVERY DAY Patient taking differently: Take 10 mg by mouth daily. 05/17/22  Yes [name], MD  aspirin EC 81 MG tablet Take 81 mg by mouth daily. Swallow whole.   Yes [provider]  atorvastatin (LIPITOR) 80 MG tablet Take 1 tablet (80 mg total) by mouth daily at 6 PM. 11/22/18  Yes [name], MD  Cholecalciferol (VITAMIN D3) 50 MCG (2000 UT) capsule Take 2,000 Units by mouth daily with lunch. 11/26/18  Yes [provider]  cholestyramine Lucrezia Starch) 4  GM/DOSE powder Take 4 g by mouth daily after supper. 06/07/22  Yes [provider]  cloNIDine (CATAPRES) 0.2 MG tablet TAKE 1 TABLET TWICE DAILY Patient taking differently: Take 0.2 mg by mouth 2 (two) times daily. 06/22/21  Yes [name], MD  Cyanocobalamin (VITAMIN B12) 1000 MCG TBCR Take 1,000 mcg by mouth daily with lunch.   Yes [provider]  estradiol (ESTRACE) 0.1 MG/GM vaginal cream Place 0.5g nightly for two weeks then twice a week after Patient taking differently: Place 1 Applicatorful vaginally once a week. 09/19/21  Yes Jaquita Folds, MD  famotidine (PEPCID) 20 MG tablet Take 1 tablet (20 mg total) by mouth 2 (two) times daily. 04/10/22  Yes Benay Pike, MD  folic acid (FOLVITE) 1 MG tablet Take 1 mg by mouth daily.   Yes [provider]  furosemide (LASIX) 40 MG tablet TAKE 2 TABLETS (80 MG TOTAL) IN MORNING AND 1 AND 1/2 TABLETS (60 MG TOTAL) IN EVENING. CHANGE IN DOSAGE. Patient taking differently: Take 40-80 mg by mouth See admin instructions. Take 80 mg by mouth in the morning and 40 mg in the afternoon 03/22/22  Yes Clegg, Amy D, NP  hydrALAZINE (APRESOLINE) 100 MG tablet TAKE 1 TABLET THREE TIMES DAILY Patient taking differently: Take 100 mg by mouth 3 (three) times daily. 02/17/22  Yes [name], MD  insulin aspart (NOVOLOG)  100 UNIT/ML injection Take 2 units for blood sugar 200-250, 4 units for blood sugar 252-300, 6 units for blood sugar 301-350, 8 units for blood sugar greater than 350 and call MD. Patient taking differently: Inject 2-8 Units into the skin See admin instructions. Inject 2-8 units into the skin three times a day with meals, PER SLIDING SCALE:  2 units for blood sugar 200-250, 4 units for blood sugar 252-300, 6 units for blood sugar 301-350, 8 units for blood sugar greater than 350 and call MD. 02/23/22  Yes Causey, Charlestine Massed, NP  lactose free nutrition (BOOST) LIQD Take 237 mLs by mouth daily with supper. (To  supplement meals)   Yes [provider]  lidocaine (XYLOCAINE) 2 % solution Use as directed 15 mLs in the mouth or throat as needed for mouth pain. Patient taking differently: Use as directed 15 mLs in the mouth or throat daily as needed for mouth pain. 01/30/22  Yes Benay Pike, MD  metoprolol succinate (TOPROL-XL) 25 MG 24 hr tablet TAKE 1/2 TABLET EVERY DAY Patient taking differently: Take 12.5 mg by mouth daily. 07/19/21  Yes [name], MD  nystatin-alum & mag hydroxide-simeth-diphenhydrAMINE Swish 5 mLs by mouth 3 times daily then spit 05/15/22  Yes Walisiewicz, Kaitlyn E, PA-C  ondansetron (ZOFRAN-ODT) 4 MG disintegrating tablet Take 1 tablet (4 mg total) by mouth every 8 (eight) hours as needed for vomiting or nausea. 05/15/22  Yes Walisiewicz, Kaitlyn E, PA-C  predniSONE (DELTASONE) 10 MG tablet Take 2 tablets (20 mg total) by mouth daily with breakfast. 06/16/22 07/16/22 Yes Iruku, Arletha Pili, MD  saccharomyces boulardii (FLORASTOR) 250 MG capsule Take 250 mg by mouth 2 (two) times daily.   Yes [provider]  simethicone (GAS-X) 80 MG chewable tablet Chew 1 tablet (80 mg total) by mouth 4 (four) times daily as needed for flatulence. 04/16/22 04/16/23 Yes Danford, Suann Larry, MD  spironolactone (ALDACTONE) 25 MG tablet TAKE 1 TABLET EVERY DAY Patient taking differently: Take 25 mg by mouth daily. 05/17/22  Yes [name], MD  tadalafil, PAH, (ADCIRCA) 20 MG tablet Take 2 tablets (40 mg total) by mouth daily. Patient taking differently: Take 40 mg by mouth at bedtime. 12/06/21  Yes [name], MD  TRESIBA FLEXTOUCH 100 UNIT/ML FlexTouch Pen Inject 9-14 Units into the skin at bedtime. 02/20/22  Yes [provider]  vitamin C (ASCORBIC ACID) 500 MG tablet Take 500 mg by mouth daily with lunch.   Yes [provider]  warfarin (COUMADIN) 5 MG tablet TAKE 1 TO 1 AND 1/2 TABLETS DAILY AS DIRECTED BY COUMADIN CLINIC Patient taking differently: Take 5-7.5  mg by mouth See admin instructions. TAKE 1 TO 1 AND 1/2 TABLETS  ( 7.5 mg) on Sunday & Friday all other days taking 5 mg each day. 05/01/22  Yes Lorretta Harp, MD  Blood Glucose Monitoring Suppl (TRUE METRIX METER) w/Device KIT  03/18/21   [provider]  glucose blood (TRUE METRIX BLOOD GLUCOSE TEST) test strip Use 1-2 times daily    [provider]  Insulin Syringe-Needle U-100 (B-D INS SYR ULTRAFINE .3CC/29G) 29G X 1/2" 0.3 ML MISC Use for sliding scale for breakthrough high blood sugar readings 02/24/22   Benay Pike, MD  mirabegron ER (MYRBETRIQ) 25 MG TB24 tablet Take 1 tablet (25 mg total) by mouth daily. Patient not taking: Reported on 06/22/2022 04/17/22   Daleen Bo, MD  TRUE [facility] BLOOD GLUCOSE TEST test strip SMARTSIG:Via Meter 04/08/21  [provider]      Allergies    Flagyl [metronidazole], Augmentin [amoxicillin-pot clavulanate], Ciprofloxacin, Coreg [carvedilol], Diflucan [fluconazole], Losartan, Privigen [immune globulin (human)], Sulfamethoxazole-trimethoprim, Verapamil, Zetia [ezetimibe], Zocor [simvastatin - high dose], and Gatifloxacin    Review of Systems   Review of Systems  Respiratory:  Negative for shortness of breath.   Cardiovascular:  Negative for chest pain.  Gastrointestinal:  Positive for abdominal pain and diarrhea. Negative for blood in stool.  Genitourinary:  Positive for dysuria and hematuria.    Physical Exam Updated Vital Signs BP (!) 183/53   Pulse 79   Temp 97.9 F (36.6 C)   Resp 20   Ht '4\' 11"'  (1.499 m)   Wt 64.4 kg   SpO2 99%   BMI 28.68 kg/m  Physical Exam Vitals and nursing note reviewed.  Constitutional:      Appearance: She is well-developed.  HENT:     Head: Normocephalic and atraumatic.  Cardiovascular:     Rate and Rhythm: Normal rate and regular rhythm.     Heart sounds: Normal heart sounds.  Pulmonary:     Effort: Pulmonary effort is normal.     Breath sounds: Normal breath sounds.   Abdominal:     Palpations: Abdomen is soft.     Tenderness: There is abdominal tenderness in the right lower quadrant, left upper quadrant and left lower quadrant.     Comments: There is ecchymosis to the right mid and lower hemi-abdomen. She's not sure where that came from  Skin:    General: Skin is warm and dry.  Neurological:     Mental Status: She is alert.     ED Results / Procedures / Treatments   Labs (all labs ordered are listed, but only abnormal results are displayed) Labs Reviewed  LIPASE, BLOOD - Abnormal; Notable for the following components:      Result Value   Lipase 66 (*)    All other components within normal limits  COMPREHENSIVE METABOLIC PANEL - Abnormal; Notable for the following components:   CO2 17 (*)    Glucose, Bld 125 (*)    BUN 76 (*)    Creatinine, Ser 2.18 (*)    Calcium 8.7 (*)    Total Protein 6.0 (*)    Albumin 3.4 (*)    AST 51 (*)    ALT 48 (*)    GFR, Estimated 22 (*)    All other components within normal limits  URINALYSIS, ROUTINE W REFLEX MICROSCOPIC - Abnormal; Notable for the following components:   APPearance TURBID (*)    Hgb urine dipstick LARGE (*)    Protein, ur 100 (*)    Leukocytes,Ua LARGE (*)    RBC / HPF >50 (*)    WBC, UA >50 (*)    Bacteria, UA MANY (*)    All other components within normal limits  CBC WITH DIFFERENTIAL/PLATELET - Abnormal; Notable for the following components:   WBC 10.7 (*)    RBC 3.25 (*)    Hemoglobin 10.3 (*)    HCT 31.5 (*)    RDW 16.8 (*)    Neutro Abs 8.7 (*)    All other components within normal limits  PROTIME-INR - Abnormal; Notable for the following components:   Prothrombin Time 24.8 (*)    INR 2.3 (*)    All other components within normal limits  IRON AND TIBC - Abnormal; Notable for the following components:   Iron 8 (*)    Saturation Ratios 3 (*)  All other components within normal limits  CULTURE, BLOOD (ROUTINE X 2)  CULTURE, BLOOD (ROUTINE X 2)  URINE CULTURE  C  DIFFICILE QUICK SCREEN W PCR REFLEX    LACTIC ACID, PLASMA  LACTIC ACID, PLASMA  APTT    EKG None  Radiology CT ABDOMEN PELVIS WO CONTRAST  Result Date: 06/22/2022 CLINICAL DATA:  Left lower quadrant abdominal pain and nausea. Sepsis. EXAM: CT ABDOMEN AND PELVIS WITHOUT CONTRAST TECHNIQUE: Multidetector CT imaging of the abdomen and pelvis was performed following the standard protocol without IV contrast. RADIATION DOSE REDUCTION: This exam was performed according to the departmental dose-optimization program which includes automated exposure control, adjustment of the mA and/or kV according to patient size and/or use of iterative reconstruction technique. COMPARISON:  CT abdomen and pelvis 04/18/2022 FINDINGS: Lower chest: New trace right pleural effusion. Atelectasis in both lower lobes. Large calcified granuloma in the left lower lobe. Coronary atherosclerosis. Hepatobiliary: Scattered calcified granulomas in the liver. Cholecystectomy. No biliary dilatation. Pancreas: Unremarkable. Spleen: Calcified granuloma. Adrenals/Urinary Tract: Unremarkable adrenal glands. Renal vascular calcifications. No evidence of a renal mass, hydronephrosis, or definite urinary tract calculi. Extensive gas and debris in the bladder with some of the gas appearing to be within the bladder wall. Stomach/Bowel: The stomach is unremarkable. A duodenal diverticulum is again noted. There is no evidence of bowel obstruction. Diverticulosis is again noted of the sigmoid colon with persistent wall thickening and surrounding inflammatory changes which are greatest near the proximal sigmoid colon. The inflammation more localized to the proximal sigmoid colon is stable to mildly increased compared to the prior study, however the degree of overall inflammation throughout the pelvis is less than on the prior CT. Vascular/Lymphatic: Extensive abdominal aortic atherosclerosis without aneurysm. No enlarged lymph nodes. Reproductive: Status  post hysterectomy.  No gross adnexal mass. Other: Minimal pelvic free fluid. No organized fluid collection or pneumoperitoneum. Nonspecific subcutaneous stranding/edema in the lower abdominal wall and proximal thighs, right greater than left. Musculoskeletal: No suspicious osseous lesion. Asymmetrically advanced right hip osteoarthrosis. Chronic L4 compression fracture. IMPRESSION: 1. Extensive gas and debris in the bladder with some gas appearing to be within the bladder wall, concerning for emphysematous cystitis. A fistula between the bladder and either bowel or vagina is not excluded given this appearance, however a discrete fistula is not readily apparent on today's CT, and a colovesical fistula was not shown on a pelvic CT utilizing rectal contrast in July. 2. Persistent/recurrent sigmoid diverticulitis. No evidence of abscess. 3. New trace right pleural effusion. 4. Aortic Atherosclerosis (ICD10-I70.0) and Emphysema (ICD10-J43.9). Electronically Signed   By: Logan Bores M.D.   On: 06/22/2022 11:32   DG Chest Port 1 View  Result Date: 06/22/2022 CLINICAL DATA:  Sepsis. EXAM: PORTABLE CHEST 1 VIEW COMPARISON:  April 18, 2022. FINDINGS: Stable cardiomediastinal silhouette. Status post aortic valve replacement. Increased bibasilar atelectasis or edema is noted with small pleural effusions. Bony thorax is unremarkable. IMPRESSION: Increased bibasilar atelectasis or edema is noted with small pleural effusions. Electronically Signed   By: Marijo Conception M.D.   On: 06/22/2022 11:15    Procedures .Critical Care  Performed by: Sherwood Gambler, MD Authorized by: Sherwood Gambler, MD   Critical care provider statement:    Critical care time (minutes):  30   Critical care time was exclusive of:  Separately billable procedures and treating other patients   Critical care was necessary to treat or prevent imminent or life-threatening deterioration of the following conditions:  Sepsis   Critical care  was time  spent personally by me on the following activities:  Development of treatment plan with patient or surrogate, discussions with consultants, evaluation of patient's response to treatment, examination of patient, ordering and review of laboratory studies, ordering and review of radiographic studies, ordering and performing treatments and interventions, pulse oximetry, re-evaluation of patient's condition and review of old charts     Medications Ordered in ED Medications  meropenem (MERREM) 1 g in sodium chloride 0.9 % 100 mL IVPB (has no administration in time range)  insulin aspart (novoLOG) injection 0-6 Units (has no administration in time range)  prochlorperazine (COMPAZINE) injection 10 mg (10 mg Intravenous Given 06/22/22 1605)  sodium bicarbonate tablet 650 mg (650 mg Oral Given 06/22/22 1604)  acetaminophen (TYLENOL) tablet 650 mg (has no administration in time range)    Or  acetaminophen (TYLENOL) suppository 650 mg (has no administration in time range)  oxyCODONE (Oxy IR/ROXICODONE) immediate release tablet 5 mg (5 mg Oral Given 06/22/22 1605)  Warfarin - Pharmacist Dosing Inpatient ( Does not apply Given 06/22/22 1610)  allopurinol (ZYLOPRIM) tablet 100 mg (100 mg Oral Given 06/22/22 1604)  aspirin EC tablet 81 mg (81 mg Oral Given 06/22/22 1604)  amLODipine (NORVASC) tablet 10 mg (10 mg Oral Given 06/22/22 1604)  cloNIDine (CATAPRES) tablet 0.2 mg (has no administration in time range)  hydrALAZINE (APRESOLINE) tablet 100 mg (100 mg Oral Given 06/22/22 1604)  metoprolol succinate (TOPROL-XL) 24 hr tablet 12.5 mg (has no administration in time range)  tadalafil (CIALIS) tablet 40 mg (has no administration in time range)  ALPRAZolam (XANAX) tablet 0.25-0.5 mg (has no administration in time range)  predniSONE (DELTASONE) tablet 20 mg (20 mg Oral Given 06/22/22 1605)  simethicone (MYLICON) chewable tablet 80 mg (has no administration in time range)  cyanocobalamin (VITAMIN B12) tablet 1,000 mcg  (1,000 mcg Oral Given 03/12/53 0981)  folic acid (FOLVITE) tablet 1 mg (1 mg Oral Given 06/22/22 1604)  HYDROmorphone (DILAUDID) injection 0.5 mg (0.5 mg Intravenous Given 06/22/22 1043)  acetaminophen (TYLENOL) tablet 650 mg (650 mg Oral Given 06/22/22 1046)  meropenem (MERREM) 1 g in sodium chloride 0.9 % 100 mL IVPB (0 g Intravenous Stopped 06/22/22 1156)  HYDROmorphone (DILAUDID) injection 0.5 mg (0.5 mg Intravenous Given 06/22/22 1300)  0.9 %  sodium chloride infusion ( Intravenous New Bag/Given 06/22/22 1609)  warfarin (COUMADIN) tablet 5 mg (5 mg Oral Given 06/22/22 1605)    ED Course/ Medical Decision Making/ A&P Clinical Course as of 06/22/22 1641  Thu Jun 22, 2022  1017 Patient's rectal temp is 101.6.  We will broaden work-up to include sepsis and add blood cultures and lactic and urine culture.  We will give antibiotics given she is febrile with abdominal pain and tachycardia. [SG]    Clinical Course User Index [SG] Sherwood Gambler, MD                           Medical Decision Making Amount and/or Complexity of Data Reviewed Labs: ordered. Radiology: ordered. ECG/medicine tests: ordered.  Risk OTC drugs. Prescription drug management. Decision regarding hospitalization.   I suspect patient's fever and abdominal pain are coming from a urinary tract infection.  As above she was given meropenem given allergies and recent ESBL urine cultures.  Vital signs improved with Tylenol and pain control.  CT images viewed/interpreted by myself and she has emphysematous cystitis which also occurred a few months ago.  Questionable diverticulitis but at the same time  last admission they thought diverticulitis but turned out to be C. difficile.  Will resend C. difficile studies as family and patient are concerned due to continued diarrhea.  However I suspect that this is more of an acute bacterial infection from the urine.  Discussed with Dr. Marylyn Ishihara who will admit.        Final Clinical  Impression(s) / ED Diagnoses Final diagnoses:  Acute urinary tract infection    Rx / DC Orders ED Discharge Orders     None         Sherwood Gambler, MD 06/22/22 1642

## 2022-06-22 NOTE — Progress Notes (Signed)
ANTICOAGULATION CONSULT NOTE - Initial Consult  Pharmacy Consult for Warfarin Indication: atrial fibrillation, mechanical valve  Allergies  Allergen Reactions   Flagyl [Metronidazole] Rash and Other (See Comments)    ALL-OVER BODY RASH   Augmentin [Amoxicillin-Pot Clavulanate] Diarrhea and Nausea And Vomiting   Ciprofloxacin Nausea Only and Other (See Comments)    Stomach ache   Coreg [Carvedilol] Other (See Comments)    Terrible cramping in the feet and had a lot of bowel movements, but not diarrhea   Diflucan [Fluconazole] Diarrhea   Losartan Swelling and Other (See Comments)    Patient doesn't recall site of swelling   Privigen [Immune Globulin (Human)] Other (See Comments)    Severe/excruciating pain   Sulfamethoxazole-Trimethoprim Nausea Only and Other (See Comments)    Stomach upset   Verapamil Hives   Zetia [Ezetimibe] Other (See Comments)    Reaction not recalled   Zocor [Simvastatin - High Dose] Other (See Comments)    Reaction not recalled   Gatifloxacin Rash and Other (See Comments)    Redness to skin around eye    Patient Measurements: Height: '4\' 11"'$  (149.9 cm) Weight: 64.4 kg (142 lb) IBW/kg (Calculated) : 43.2  Vital Signs: Temp: 98.6 F (37 C) (09/14 1132) Temp Source: Oral (09/14 1132) BP: 159/54 (09/14 1230) Pulse Rate: 88 (09/14 1230)  Labs: Recent Labs    06/22/22 0955  HGB 10.3*  HCT 31.5*  PLT 150  APTT 36  LABPROT 24.8*  INR 2.3*  CREATININE 2.18*    Estimated Creatinine Clearance: 16 mL/min (A) (by C-G formula based on SCr of 2.18 mg/dL (H)).   Medical History: Past Medical History:  Diagnosis Date   Aortic atherosclerosis ([place]) 01/23/2018   Atrial fibrillation ([facility])    Benign positional vertigo 10/06/2011   Carotid artery disease ([place])    Chemotherapy induced neutropenia ([place]) 07/21/2019   Cholelithiasis 01/23/2018   Cholelithiasis 01/23/2018   Chronic anticoagulation    Coronary artery disease    status post coronary  artery bypass grafting times 07/10/2004   Diabetes mellitus    Hypercholesteremia    Hypertension    Mechanical heart valve present    H. aortic valve replacement at the time of bypass surgery October 2005   Moderate to severe pulmonary hypertension ([place])    Peripheral arterial disease ([facility])    history of left common iliac artery PTA and stenting for a chronic total occlusion 08/26/01    Medications:  Scheduled:   insulin aspart  0-6 Units Subcutaneous TID WC   sodium bicarbonate  650 mg Oral TID   warfarin  5 mg Oral ONCE-1600   Warfarin - Pharmacist Dosing Inpatient   Does not apply q1600   Infusions:   meropenem (MERREM) IV     PRN: prochlorperazine  Assessment: 81 yo female on chronic warfarin for afib and mechanical aortic valve presents with diverticulitis and UTI.  Pharmacy consulted to continue warfarin dosing while inpatient.  INR therapeutic (2.3) Hgb 10.3, hx autoimmune hemolytic anemia, Plts WNL No bleeding documented No drug-drug interactions noted  Goal of Therapy:  INR 2-3 per outpatient cardiology notes   Plan:  Warfarin '5mg'$  PO x 1 today per home regimen Daily PT/INR  Peggyann Juba, PharmD, BCPS Pharmacy: [phone] 06/22/2022,1:48 PM

## 2022-06-22 NOTE — H&P (Signed)
History and Physical    Patient: [name] DOB: 23-Jan-1941 DOA: 06/22/2022 DOS: the patient was seen and examined on 06/22/2022 PCP: [name], [name], MD  Patient coming from: Home  Chief Complaint:  Chief Complaint  Patient presents with   Abdominal Pain   Nausea   HPI: [name] is a 81 y.o. female with medical history significant of autoimmune hemolytic anemia, DM2, HLD, HTN, vasculitis, PAF, PAH, chronic diastolic HF. Presenting with abdominal/pelvic pain, N/V, diarrhea. She reports that she's had chronic diarrhea since her diagnosis of C diff back in the summer. However, yesterday her episodes of diarrhea seem to worsen. She also noticed sharp pains in her lower abdomen (worse on right side) in the middle of the night. She had some nausea and vomiting. No hematemesis noted. She reports during this time she has also had burning with urination. She has noticed intermittent hematuria, but this is not new per her report. When her symptoms did not improve this morning, she decided to come to the ED for assistance.   Review of Systems: As mentioned in the history of present illness. All other systems reviewed and are negative. Past Medical History:  Diagnosis Date   Aortic atherosclerosis ([place]) 01/23/2018   Atrial fibrillation ([facility])    Benign positional vertigo 10/06/2011   Carotid artery disease ([place])    Chemotherapy induced neutropenia ([place]) 07/21/2019   Cholelithiasis 01/23/2018   Cholelithiasis 01/23/2018   Chronic anticoagulation    Coronary artery disease    status post coronary artery bypass grafting times 07/10/2004   Diabetes mellitus    Hypercholesteremia    Hypertension    Mechanical heart valve present    H. aortic valve replacement at the time of bypass surgery October 2005   Moderate to severe pulmonary hypertension ([place])    Peripheral arterial disease ([place])    history of left common iliac artery PTA and stenting for a chronic total occlusion  08/26/01   Past Surgical History:  Procedure Laterality Date   anterior repair  2009   AORTIC VALVE REPLACEMENT  2005   Dr. Cyndia Bent   CARDIAC CATHETERIZATION  11/10/2004   40% right common illiac, 70% in stent restenosis of distal left common illiac,    CARDIAC CATHETERIZATION  05/18/2004   LAD 50-70% midstenosis, RCA dominant w/50% stenosis, 50% Right common Illiac artery ostial stenosis, 90% in stent restenosis within midportion of left common illiac stent   Carotid Duplex  03/12/2012   RSA-elev. velocities suggestive of a 50-69% diameter reduction, Right&Left Bulb/Prox ICA-mild-mod.fibrous plaqueelevating Velocities abnormal study.   CHOLECYSTECTOMY N/A 03/01/2018   Procedure: LAPAROSCOPIC CHOLECYSTECTOMY;  Surgeon: Judeth Horn, MD;  Location: [place];  Service: General;  Laterality: N/A;   COLONOSCOPY WITH PROPOFOL N/A 01/22/2018   Procedure: COLONOSCOPY WITH PROPOFOL;  Surgeon: Wilford Corner, MD;  Location: [facility];  Service: Endoscopy;  Laterality: N/A;   ESOPHAGOGASTRODUODENOSCOPY (EGD) WITH PROPOFOL N/A 01/22/2018   Procedure: ESOPHAGOGASTRODUODENOSCOPY (EGD) WITH PROPOFOL;  Surgeon: Wilford Corner, MD;  Location: [place];  Service: Endoscopy;  Laterality: N/A;   ESOPHAGOGASTRODUODENOSCOPY (EGD) WITH PROPOFOL N/A 11/21/2018   Procedure: ESOPHAGOGASTRODUODENOSCOPY (EGD) WITH PROPOFOL;  Surgeon: [name], MD;  Location: [place];  Service: Gastroenterology;  Laterality: N/A;   ESOPHAGOGASTRODUODENOSCOPY (EGD) WITH PROPOFOL Left 02/27/2019   Procedure: ESOPHAGOGASTRODUODENOSCOPY (EGD) WITH PROPOFOL;  Surgeon: Arta Silence, MD;  Location: [facility];  Service: Endoscopy;  Laterality: Left;   GIVENS CAPSULE STUDY N/A 11/21/2018   Procedure: GIVENS CAPSULE STUDY;  Surgeon: [name],  MD;  Location: [place];  Service: Gastroenterology;  Laterality: N/A;  To be deployed during EGD   Lower Ext. Duplex  03/12/2012   Right Proximal CIA- vessel narrowing w/elevated  velocities 0-49% diameter reduction. Right SFA-mild mixed density plaque throughout vessel.   NM MYOCAR PERF WALL MOTION  05/19/2010   protocol: Persantine, post stress EF 65%, negative for ischemia, low risk scan   RIGHT HEART CATH N/A 06/27/2018   Procedure: RIGHT HEART CATH;  Surgeon: [name], MD;  Location: [facility];  Service: Cardiovascular;  Laterality: N/A;   TOTAL ABDOMINAL HYSTERECTOMY W/ BILATERAL SALPINGOOPHORECTOMY  1989   TRANSTHORACIC ECHOCARDIOGRAM  08/29/2012   Moderately calcified annulus of mitral valve, moderate regurg. of both mitral valve and tricuspid valve.    Social History:  reports that she has quit smoking. Her smoking use included cigarettes. She has a 30.00 pack-year smoking history. She has never used smokeless tobacco. She reports that she does not drink alcohol and does not use drugs.  Allergies  Allergen Reactions   Flagyl [Metronidazole] Rash and Other (See Comments)    ALL-OVER BODY RASH   Augmentin [Amoxicillin-Pot Clavulanate] Diarrhea and Nausea And Vomiting   Ciprofloxacin Nausea Only and Other (See Comments)    Stomach ache   Coreg [Carvedilol] Other (See Comments)    Terrible cramping in the feet and had a lot of bowel movements, but not diarrhea   Diflucan [Fluconazole] Diarrhea   Losartan Swelling and Other (See Comments)    Patient doesn't recall site of swelling   Privigen [Immune Globulin (Human)] Other (See Comments)    Severe/excruciating pain   Sulfamethoxazole-Trimethoprim Nausea Only and Other (See Comments)    Stomach upset   Verapamil Hives   Zetia [Ezetimibe] Other (See Comments)    Reaction not recalled   Zocor [Simvastatin - High Dose] Other (See Comments)    Reaction not recalled   Gatifloxacin Rash and Other (See Comments)    Redness to skin around eye    Family History  Problem Relation Age of Onset   Heart disease Father    Hypertension Father    Hyperlipidemia Father    Breast cancer Neg Hx      Prior to Admission medications   Medication Sig Start Date End Date Taking? Authorizing Provider  acetaminophen (TYLENOL) 325 MG tablet Take 650 mg by mouth every 6 (six) hours as needed for mild pain or fever.    [provider]  alendronate (FOSAMAX) 70 MG tablet Take 70 mg by mouth every Saturday. 12/06/21   [provider]  allopurinol (ZYLOPRIM) 100 MG tablet Take 100 mg by mouth daily.    [provider]  ALPRAZolam Duanne Moron) 0.5 MG tablet Take 0.25-0.5 mg by mouth at bedtime. 07/27/21   [provider]  amLODipine (NORVASC) 10 MG tablet TAKE 1 TABLET EVERY DAY 05/17/22   [name], MD  aspirin EC 81 MG tablet Take 81 mg by mouth daily. Swallow whole.    [provider]  atorvastatin (LIPITOR) 80 MG tablet Take 1 tablet (80 mg total) by mouth daily at 6 PM. 11/22/18   [name], MD  Blood Glucose Monitoring Suppl (TRUE METRIX METER) w/Device KIT  03/18/21   [provider]  Cholecalciferol (VITAMIN D3) 50 MCG (2000 UT) capsule Take 2,000 Units by mouth daily with lunch. 11/26/18   [provider]  cholestyramine Lucrezia Starch) 4 GM/DOSE powder SMARTSIG:3 By Mouth 3 Times Daily 06/07/22   [provider]  cloNIDine (CATAPRES) 0.2 MG tablet TAKE 1 TABLET TWICE DAILY Patient taking differently: Take 0.2 mg by mouth 2 (two) times daily. 06/22/21   [name], MD  Cyanocobalamin (VITAMIN B12) 1000 MCG TBCR Take 1,000 mcg by mouth daily with lunch.    [provider]  estradiol (ESTRACE) 0.1 MG/GM vaginal cream Place 0.5g nightly for two weeks then twice a week after Patient taking differently: Place 1 Applicatorful vaginally 3 (three) times a week. 09/19/21   Jaquita Folds, MD  famotidine (PEPCID) 20 MG tablet Take 1 tablet (20 mg total) by mouth 2 (two) times daily. 04/10/22   Benay Pike, MD  folic acid (FOLVITE) 1 MG tablet Take 1 mg by mouth daily.    [provider]  furosemide  (LASIX) 40 MG tablet TAKE 2 TABLETS (80 MG TOTAL) IN MORNING AND 1 AND 1/2 TABLETS (60 MG TOTAL) IN EVENING. CHANGE IN DOSAGE. Patient taking differently: Take 40-80 mg by mouth See admin instructions. Take 80 mg by mouth in the morning and 40 mg in the afternoon 03/22/22   Clegg, Amy D, NP  glipiZIDE (GLUCOTROL) 5 MG tablet Take 1 tablet (5 mg total) by mouth daily. 01/09/22   Benay Pike, MD  glucose blood (TRUE METRIX BLOOD GLUCOSE TEST) test strip Use 1-2 times daily    [provider]  hydrALAZINE (APRESOLINE) 100 MG tablet TAKE 1 TABLET THREE TIMES DAILY Patient taking differently: Take 100 mg by mouth 3 (three) times daily. 02/17/22   [name], MD  insulin aspart (NOVOLOG) 100 UNIT/ML injection Take 2 units for blood sugar 200-250, 4 units for blood sugar 252-300, 6 units for blood sugar 301-350, 8 units for blood sugar greater than 350 and call MD. Patient taking differently: Inject 2-8 Units into the skin See admin instructions. Inject 2-8 units into the skin three times a day with meals, PER SLIDING SCALE:  2 units for blood sugar 200-250, 4 units for blood sugar 252-300, 6 units for blood sugar 301-350, 8 units for blood sugar greater than 350 and call MD. 02/23/22   Gardenia Phlegm, NP  Insulin Syringe-Needle U-100 (B-D INS SYR ULTRAFINE .3CC/29G) 29G X 1/2" 0.3 ML MISC Use for sliding scale for breakthrough high blood sugar readings 02/24/22   Benay Pike, MD  lactose free nutrition (BOOST) LIQD Take 237 mLs by mouth daily with supper. (To supplement meals)    [provider]  lidocaine (XYLOCAINE) 2 % solution Use as directed 15 mLs in the mouth or throat as needed for mouth pain. Patient taking differently: Use as directed 15 mLs in the mouth or throat daily as needed for mouth pain. 01/30/22   Benay Pike, MD  metoprolol succinate (TOPROL-XL) 25 MG 24 hr tablet TAKE 1/2 TABLET EVERY DAY Patient taking differently: Take 12.5 mg by mouth daily.  07/19/21   [name], MD  mirabegron ER (MYRBETRIQ) 25 MG TB24 tablet Take 1 tablet (25 mg total) by mouth daily. 04/17/22   Daleen Bo, MD  nystatin-alum & mag hydroxide-simeth-diphenhydrAMINE Swish 5 mLs by mouth 3 times daily then spit 05/15/22   Walisiewicz, Verline Lema E, PA-C  ondansetron (ZOFRAN-ODT) 4 MG disintegrating tablet Take 1 tablet (4 mg total) by mouth every 8 (eight) hours as needed for vomiting or nausea. 05/15/22   Walisiewicz, Kaitlyn E, PA-C  predniSONE (DELTASONE) 10 MG tablet TAKE 2 TABLETS BY MOUTH DAILY WITH BREAKFAST 06/19/22   Causey, Charlestine Massed, NP  predniSONE (DELTASONE) 10 MG tablet Take 2  tablets (20 mg total) by mouth daily with breakfast. 06/16/22 07/16/22  Benay Pike, MD  simethicone (GAS-X) 80 MG chewable tablet Chew 1 tablet (80 mg total) by mouth 4 (four) times daily as needed for flatulence. 04/16/22 04/16/23  Edwin Dada, MD  spironolactone (ALDACTONE) 25 MG tablet TAKE 1 TABLET EVERY DAY 05/17/22   [name], MD  tadalafil, PAH, (ADCIRCA) 20 MG tablet Take 2 tablets (40 mg total) by mouth daily. Patient taking differently: Take 40 mg by mouth at bedtime. 12/06/21   [name], MD  TRESIBA FLEXTOUCH 100 UNIT/ML FlexTouch Pen Inject 9-14 Units into the skin at bedtime. 02/20/22   [provider]  TRUE METRIX BLOOD GLUCOSE TEST test strip SMARTSIG:Via Meter 04/08/21   [provider]  vitamin C (ASCORBIC ACID) 500 MG tablet Take 500 mg by mouth daily with lunch.    [provider]  warfarin (COUMADIN) 5 MG tablet TAKE 1 TO 1 AND 1/2 TABLETS DAILY AS DIRECTED BY COUMADIN CLINIC 05/01/22   Lorretta Harp, MD    Physical Exam: Vitals:   06/22/22 1030 06/22/22 1118 06/22/22 1130 06/22/22 1132  BP: (!) 134/42 (!) 154/56 (!) 167/52   Pulse: 94 91 89   Resp: _0 Temp:    98.6 F (37 C)  TempSrc:    Oral  SpO2: 99% 97% 97%   Weight:      Height:       General: 81 y.o. female resting in bed in  NAD Eyes: PERRL, normal sclera ENMT: Nares patent w/o discharge, orophaynx clear, dentition normal, ears w/o discharge/lesions/ulcers Neck: Supple, trachea midline Cardiovascular: RRR, +S1, S2, no m/g/r, systolic click, equal pulses throughout Respiratory: CTABL, no w/r/r, normal WOB GI: BS+, mild distention, soft, TTP RLQ, no masses noted, no organomegaly noted MSK: No c/c, BLE 3+ edema/erythema (chronic and stable per her report)  Neuro: A&O x 3, no focal deficits Psyc: Appropriate interaction and affect, calm/cooperative  Data Reviewed:  Results for orders placed or performed during the hospital encounter of 06/22/22 (from the past 24 hour(s))  Lipase, blood     Status: Abnormal   Collection Time: 06/22/22  9:55 AM  Result Value Ref Range   Lipase 66 (H) 11 - 51 U/L  Comprehensive metabolic panel     Status: Abnormal   Collection Time: 06/22/22  9:55 AM  Result Value Ref Range   Sodium 135 135 - 145 mmol/L   Potassium 4.7 3.5 - 5.1 mmol/L   Chloride 109 98 - 111 mmol/L   CO2 17 (L) 22 - 32 mmol/L   Glucose, Bld 125 (H) 70 - 99 mg/dL   BUN 76 (H) 8 - 23 mg/dL   Creatinine, Ser 2.18 (H) 0.44 - 1.00 mg/dL   Calcium 8.7 (L) 8.9 - 10.3 mg/dL   Total Protein 6.0 (L) 6.5 - 8.1 g/dL   Albumin 3.4 (L) 3.5 - 5.0 g/dL   AST 51 (H) 15 - 41 U/L   ALT 48 (H) 0 - 44 U/L   Alkaline Phosphatase 99 38 - 126 U/L   Total Bilirubin 0.7 0.3 - 1.2 mg/dL   GFR, Estimated 22 (L) >60 mL/min   Anion gap 9 5 - 15  CBC with Differential     Status: Abnormal   Collection Time: 06/22/22  9:55 AM  Result Value Ref Range   WBC 10.7 (H) 4.0 - 10.5 K/uL   RBC 3.25 (L) 3.87 - 5.11 MIL/uL   Hemoglobin  10.3 (L) 12.0 - 15.0 g/dL   HCT 31.5 (L) 36.0 - 46.0 %   MCV 96.9 80.0 - 100.0 fL   MCH 31.7 26.0 - 34.0 pg   MCHC 32.7 30.0 - 36.0 g/dL   RDW 16.8 (H) 11.5 - 15.5 %   Platelets 150 150 - 400 K/uL   nRBC 0.0 0.0 - 0.2 %   Neutrophils Relative % 82 %   Neutro Abs 8.7 (H) 1.7 - 7.7 K/uL   Lymphocytes  Relative 12 %   Lymphs Abs 1.3 0.7 - 4.0 K/uL   Monocytes Relative 6 %   Monocytes Absolute 0.6 0.1 - 1.0 K/uL   Eosinophils Relative 0 %   Eosinophils Absolute 0.0 0.0 - 0.5 K/uL   Basophils Relative 0 %   Basophils Absolute 0.0 0.0 - 0.1 K/uL   Immature Granulocytes 0 %   Abs Immature Granulocytes 0.04 0.00 - 0.07 K/uL  Protime-INR     Status: Abnormal   Collection Time: 06/22/22  9:55 AM  Result Value Ref Range   Prothrombin Time 24.8 (H) 11.4 - 15.2 seconds   INR 2.3 (H) 0.8 - 1.2  APTT     Status: None   Collection Time: 06/22/22  9:55 AM  Result Value Ref Range   aPTT 36 24 - 36 seconds  Urinalysis, Routine w reflex microscopic Urine, In & Out Cath     Status: Abnormal   Collection Time: 06/22/22 10:30 AM  Result Value Ref Range   Color, Urine YELLOW YELLOW   APPearance TURBID (A) CLEAR   Specific Gravity, Urine 1.010 1.005 - 1.030   pH 5.0 5.0 - 8.0   Glucose, UA NEGATIVE NEGATIVE mg/dL   Hgb urine dipstick LARGE (A) NEGATIVE   Bilirubin Urine NEGATIVE NEGATIVE   Ketones, ur NEGATIVE NEGATIVE mg/dL   Protein, ur 100 (A) NEGATIVE mg/dL   Nitrite NEGATIVE NEGATIVE   Leukocytes,Ua LARGE (A) NEGATIVE   RBC / HPF >50 (H) 0 - 5 RBC/hpf   WBC, UA >50 (H) 0 - 5 WBC/hpf   Bacteria, UA MANY (A) NONE SEEN   Squamous Epithelial / LPF 0-5 0 - 5   WBC Clumps PRESENT   Lactic acid, plasma     Status: None   Collection Time: 06/22/22 10:36 AM  Result Value Ref Range   Lactic Acid, Venous 1.3 0.5 - 1.9 mmol/L   *Note: Due to a large number of results and/or encounters for the requested time period, some results have not been displayed. A complete set of results can be found in Results Review.   CT ab/pelvis 1. Extensive gas and debris in the bladder with some gas appearing to be within the bladder wall, concerning for emphysematous cystitis. A fistula between the bladder and either bowel or vagina is not excluded given this appearance, however a discrete fistula is not readily  apparent on today's CT, and a colovesical fistula was not shown on a pelvic CT utilizing rectal contrast in July. 2. Persistent/recurrent sigmoid diverticulitis. No evidence of abscess. 3. New trace right pleural effusion. 4. Aortic Atherosclerosis (ICD10-I70.0) and Emphysema (ICD10-J43.9).  CXR: Increased bibasilar atelectasis or edema is noted with small pleural effusions.  Assessment and Plan: UTI SIRS     - admit to inpt, tele     - continue merrem     - CT as above; similar appearance and presentation in July of this year; fistula was r/o'd w/ further imaging and general surgery consultation at the time; continue abx for  right now and if not improving can ask general surgery to revisit.      - gentle fluids tonight  Diverticulitis Diarrhea      - recent C diff infection in July; rpt lab ordered in ED, follow; if positive, start vanc      - merrem will also cover other gut bugs  CKD 4     - she is at baseline, watch nephrotoxins  Chronic diastolic HF PAH PAF HTN     - continue home regimen when confirmed; with exception of diuretic, can resume that in AM  DM2     - A1c, SSI, glucose checks, DM diet  HLD     - mild elevation of LFTs; hold her statin for tonight  Hx of vasculitis     - continue home regimen when confirmed  Normocytic anemia     - at baseline; no evidence of bleed, follow  Advance Care Planning:   Code Status: DNR  Consults: None  Family Communication: w/ family at bedside  Severity of Illness: The appropriate patient status for this patient is INPATIENT. Inpatient status is judged to be reasonable and necessary in order to provide the required intensity of service to ensure the patient's safety. [name] presenting symptoms, physical exam findings, and initial radiographic and laboratory data in the context of their chronic comorbidities is felt to place them at high risk for further clinical deterioration. Furthermore, it is not anticipated  that the patient will be medically stable for discharge from the hospital within 2 midnights of admission.   * I certify that at the point of admission it is my clinical judgment that the patient will require inpatient hospital care spanning beyond 2 midnights from the point of admission due to high intensity of service, high risk for further deterioration and high frequency of surveillance required.*  Time spent in coordination of this H&P: 51 minutes  Author: Jonnie Finner, DO 06/22/2022 12:16 PM  For on call review [email or web address].

## 2022-06-23 ENCOUNTER — Inpatient Hospital Stay: Payer: Medicare HMO

## 2022-06-23 ENCOUNTER — Inpatient Hospital Stay: Payer: Medicare HMO | Admitting: Hematology and Oncology

## 2022-06-23 DIAGNOSIS — E1122 Type 2 diabetes mellitus with diabetic chronic kidney disease: Secondary | ICD-10-CM

## 2022-06-23 DIAGNOSIS — Z7901 Long term (current) use of anticoagulants: Secondary | ICD-10-CM | POA: Diagnosis not present

## 2022-06-23 DIAGNOSIS — I2721 Secondary pulmonary arterial hypertension: Secondary | ICD-10-CM

## 2022-06-23 DIAGNOSIS — N181 Chronic kidney disease, stage 1: Secondary | ICD-10-CM

## 2022-06-23 DIAGNOSIS — I159 Secondary hypertension, unspecified: Secondary | ICD-10-CM

## 2022-06-23 DIAGNOSIS — N390 Urinary tract infection, site not specified: Secondary | ICD-10-CM | POA: Diagnosis not present

## 2022-06-23 DIAGNOSIS — I5032 Chronic diastolic (congestive) heart failure: Secondary | ICD-10-CM | POA: Diagnosis not present

## 2022-06-23 DIAGNOSIS — K5792 Diverticulitis of intestine, part unspecified, without perforation or abscess without bleeding: Secondary | ICD-10-CM

## 2022-06-23 DIAGNOSIS — R197 Diarrhea, unspecified: Secondary | ICD-10-CM

## 2022-06-23 DIAGNOSIS — N184 Chronic kidney disease, stage 4 (severe): Secondary | ICD-10-CM

## 2022-06-23 DIAGNOSIS — Z794 Long term (current) use of insulin: Secondary | ICD-10-CM

## 2022-06-23 DIAGNOSIS — L8990 Pressure ulcer of unspecified site, unspecified stage: Secondary | ICD-10-CM | POA: Insufficient documentation

## 2022-06-23 DIAGNOSIS — I480 Paroxysmal atrial fibrillation: Secondary | ICD-10-CM

## 2022-06-23 LAB — CBC
HCT: 26.2 % — ABNORMAL LOW (ref 36.0–46.0)
Hemoglobin: 8.3 g/dL — ABNORMAL LOW (ref 12.0–15.0)
MCH: 31.4 pg (ref 26.0–34.0)
MCHC: 31.7 g/dL (ref 30.0–36.0)
MCV: 99.2 fL (ref 80.0–100.0)
Platelets: 101 10*3/uL — ABNORMAL LOW (ref 150–400)
RBC: 2.64 MIL/uL — ABNORMAL LOW (ref 3.87–5.11)
RDW: 16.6 % — ABNORMAL HIGH (ref 11.5–15.5)
WBC: 9.3 10*3/uL (ref 4.0–10.5)
nRBC: 0 % (ref 0.0–0.2)

## 2022-06-23 LAB — COMPREHENSIVE METABOLIC PANEL
ALT: 39 U/L (ref 0–44)
AST: 39 U/L (ref 15–41)
Albumin: 2.6 g/dL — ABNORMAL LOW (ref 3.5–5.0)
Alkaline Phosphatase: 78 U/L (ref 38–126)
Anion gap: 6 (ref 5–15)
BUN: 78 mg/dL — ABNORMAL HIGH (ref 8–23)
CO2: 19 mmol/L — ABNORMAL LOW (ref 22–32)
Calcium: 8 mg/dL — ABNORMAL LOW (ref 8.9–10.3)
Chloride: 110 mmol/L (ref 98–111)
Creatinine, Ser: 2.16 mg/dL — ABNORMAL HIGH (ref 0.44–1.00)
GFR, Estimated: 22 mL/min — ABNORMAL LOW (ref >60)
Glucose, Bld: 230 mg/dL — ABNORMAL HIGH (ref 70–99)
Potassium: 5.2 mmol/L — ABNORMAL HIGH (ref 3.5–5.1)
Sodium: 135 mmol/L (ref 135–145)
Total Bilirubin: 0.7 mg/dL (ref 0.3–1.2)
Total Protein: 4.8 g/dL — ABNORMAL LOW (ref 6.5–8.1)

## 2022-06-23 LAB — GLUCOSE, CAPILLARY
Glucose-Capillary: 197 mg/dL — ABNORMAL HIGH (ref 70–99)
Glucose-Capillary: 221 mg/dL — ABNORMAL HIGH (ref 70–99)
Glucose-Capillary: 287 mg/dL — ABNORMAL HIGH (ref 70–99)
Glucose-Capillary: 276 mg/dL — ABNORMAL HIGH (ref 70–99)

## 2022-06-23 LAB — PROTIME-INR
INR: 2.5 — ABNORMAL HIGH (ref 0.8–1.2)
Prothrombin Time: 26.4 seconds — ABNORMAL HIGH (ref 11.4–15.2)

## 2022-06-23 MED ORDER — ATORVASTATIN CALCIUM 40 MG PO TABS
80.0000 mg | ORAL_TABLET | Freq: Every day | ORAL | Status: DC
  Administered 2022-06-23 – 2022-06-24 (×2): 80 mg via ORAL
  Filled 2022-06-23 (×2): qty 2

## 2022-06-23 MED ORDER — WARFARIN SODIUM 5 MG PO TABS
5.0000 mg | ORAL_TABLET | Freq: Once | ORAL | Status: AC
  Administered 2022-06-23: 5 mg via ORAL
  Filled 2022-06-23: qty 1

## 2022-06-23 MED ORDER — VANCOMYCIN HCL 125 MG PO CAPS
125.0000 mg | ORAL_CAPSULE | Freq: Two times a day (BID) | ORAL | Status: DC
  Administered 2022-06-23 – 2022-06-25 (×5): 125 mg via ORAL
  Filled 2022-06-23 (×6): qty 1

## 2022-06-23 MED ORDER — INSULIN GLARGINE-YFGN 100 UNIT/ML ~~LOC~~ SOLN
8.0000 [IU] | Freq: Every day | SUBCUTANEOUS | Status: DC
  Administered 2022-06-23: 8 [IU] via SUBCUTANEOUS
  Filled 2022-06-23 (×2): qty 0.08

## 2022-06-23 NOTE — Progress Notes (Signed)
  Transition of Care [facility]) Screening Note   Patient Details  Name: [name] Date of Birth: 07/27/1941   Transition of Care [facility]) CM/SW Contact:    Roseanne Kaufman, RN Phone Number: 06/23/2022, 4:30 PM    Transition of Care Department [facility]) has reviewed patient and no TOC needs have been identified at this time. We will continue to monitor patient advancement through interdisciplinary progression rounds. If new patient transition needs arise, please place a TOC consult.

## 2022-06-23 NOTE — Progress Notes (Signed)
ANTICOAGULATION CONSULT NOTE - Follow Up Consult  Pharmacy Consult for Coumadin Indication:  afib, mech AVR  Allergies  Allergen Reactions   Flagyl [Metronidazole] Rash and Other (See Comments)    ALL-OVER BODY RASH   Augmentin [Amoxicillin-Pot Clavulanate] Diarrhea and Nausea And Vomiting   Ciprofloxacin Nausea Only and Other (See Comments)    Stomach ache   Coreg [Carvedilol] Other (See Comments)    Terrible cramping in the feet and had a lot of bowel movements, but not diarrhea   Diflucan [Fluconazole] Diarrhea   Losartan Swelling and Other (See Comments)    Patient doesn't recall site of swelling   Privigen [Immune Globulin (Human)] Other (See Comments)    Severe/excruciating pain   Sulfamethoxazole-Trimethoprim Nausea Only and Other (See Comments)    Stomach upset   Verapamil Hives   Zetia [Ezetimibe] Other (See Comments)    Reaction not recalled   Zocor [Simvastatin - High Dose] Other (See Comments)    Reaction not recalled   Gatifloxacin Rash and Other (See Comments)    Redness to skin around eye    Patient Measurements: Height: '4\' 11"'$  (149.9 cm) Weight: 64.4 kg (142 lb) IBW/kg (Calculated) : 43.2  Vital Signs: Temp: 97.6 F (36.4 C) (09/15 0515) Temp Source: Oral (09/15 0515) BP: 150/47 (09/15 0515) Pulse Rate: 84 (09/15 0515)  Labs: Recent Labs    06/22/22 0955 06/23/22 0416  HGB 10.3* 8.3*  HCT 31.5* 26.2*  PLT 150 101*  APTT 36  --   LABPROT 24.8* 26.4*  INR 2.3* 2.5*  CREATININE 2.18* 2.16*    Estimated Creatinine Clearance: 16.1 mL/min (A) (by C-G formula based on SCr of 2.16 mg/dL (H)).   Assessment:  AC/Heme: Warf PTA for PAF, mech AVR. INR 2.5. Hgb 10.3>8.3 - PTA Coum '5mg'$  daily except 7.'5mg'$  Sun/Fri, admit INR 2.3 (goal 2-3) - Iron 8 low, Sats 3 low, (autoimmune hemolytic anemia,)  Goal of Therapy:  INR 2-3 Monitor platelets by anticoagulation protocol: Yes   Plan:  Repeat Coumadin '5mg'$  po x 1 today Daily INR   Sherral Dirocco S.  Alford Highland, PharmD, BCPS Clinical Staff Pharmacist [email or web address]  Alford Highland, [place] 06/23/2022,8:04 AM

## 2022-06-23 NOTE — Progress Notes (Signed)
PROGRESS NOTE    NYKIA [name]  VQQ:[id number] DOB: 25-Jul-1941 DOA: 06/22/2022 PCP: [name], MD    Brief Narrative:   [name] is a 81 y.o. female with past medical history significant for type 2 diabetes mellitus, hemolytic anemia, HLD, CKD stage 4, vasculitis, paroxysmal atrial fibrillation on Coumadin, pulmonary artery hypertension, chronic diastolic congestive heart failure, history of diverticulitis, history of ESBL UTI, history of C. difficile colitis who presented to [facility] ED on 9/14 via EMS with a chief complaint of lower abdominal pain, nausea/diarrhea.  Patient reports she has had chronic diarrhea since diagnosis of C. difficile colitis in July 2023.  However, yesterday her episodes of diarrhea seem to worsen.  Also reports sharp pains in her lower abdomen, worse on the right.  Denies hematemesis.  Also notable burning with urination.  When her symptoms did not improve she decided to come to ED for further evaluation.  In the ED, temperature 101.8 F, HR 101, RR 26, BP 177/56, SPO2 96% on room air.  WBC 10.7, hemoglobin 10.3, platelets 150.  Sodium 135, potassium 4.7, chloride 109, CO2 17, BUN 76, creatinine 2.18.  Glucose 125.  AST 51, ALT 48, total bilirubin 0.7.  Lipase 66.  Lactic acid 1.3.  INR 2.3.  Urinalysis with large leukocytes, negative nitrite, many bacteria, greater than 50 WBCs.  CT abdomen/pelvis with extensive gas and debris within the bladder and some gas appearing to be within the bladder wall concerning for emphysematous cystitis, persistent/recurrent sigmoid diverticulitis with no evidence of abscess, new trace right pleural effusion.  Urine culture and blood cultures obtained.  Assessment & Plan:   Sepsis, POA GNR UTI Patient presenting with abdominal pain, increased urinary frequency/dysuria.  History of ESBL UTI.  Urinalysis consistent with UTI.  Patient with temperature 101.8 F, tachycardic, tachypneic, hypertensive on admission with elevated WBC  count of 10.7. --WBC 10.7>9.3 --Urine culture: + >100K GNR, await further identification/susceptibilities --Blood cultures x2: Pending --Continue meropenem --CBC daily  Recurrent diverticulitis CT abdomen/pelvis with noted persistent/recurrent sigmoid diverticulitis with no evidence of abscess on admission. --Continue meropenem  Hx C. difficile colitis --C. difficile PCR: Pending --Continue vancomycin 125 mg PO BID for C. difficile prophylaxis while on broad-spectrum antibiotics with meropenem as above. --Enteric precautions  Type 2 diabetes mellitus Home regimen includes Tresiba 9-14 units subcutaneously daily, NovoLog sliding scale.  Hemoglobin A1c 6.3 on 04/27/2022, well controlled. --Semglee 8u [place] daily --SSI for coverage --CBGs qAC/HS  Paroxysmal atrial fibrillation --Metoprolol succinate 12.5 mg p.o. daily --Continue warfarin, pharmacy consulted for dosing/monitoring, INR daily  Chronic diastolic congestive heart failure, compensated --Continue metoprolol succinate 12.5 mg p.o. daily --Hold home spironolactone, furosemide  Essential hypertension --amlodipine 10 mg p.o. daily --hydralazine 100 mg p.o. 3 times daily --metoprolol succinate 12.5 mg p.o. daily --clonidine 0.2 mg p.o. twice daily --Holding spironolactone/Lasix --Continue aspirin and statin --Continue monitor BP closely  CKD stage IV Baseline creatinine 2.0-2.4; creatinine 2.16; stable --Avoid nephrotoxins, renal dose all medications --BMP daily  Hyperlipidemia --Atorvastatin 80 mg p.o. daily  Anxiety: Alprazolam 0.25-0.5 mg p.o. nightly  Pulmonary artery hypertension --Continue tadalafil 40 mg p.o. nightly --Prednisone 20 mg p.o. daily  GERD: Hold PPI with history of C. difficile colitis  Weakness/deconditioning/gait disturbance: Patient currently lives alone.  Appears to have poor social support; now with recurrent hospitalizations --PT/OT evaluation   DVT prophylaxis:  warfarin (COUMADIN)  tablet 5 mg    Code Status: DNR Family Communication: No family present at bedside this morning  Disposition Plan:  Level of care: Telemetry Status is: Inpatient Remains inpatient appropriate because: Continues on IV antibiotics, pending PT/OT evaluation, need finalized urine culture before stable for discharge home.  Anticipate discharge home in 2-3 days.    Consultants:  None  Procedures:  None  Antimicrobials:  Meropenem 9/14>> Oral vancomycin 9/15>>   Subjective: Patient seen and examined at bedside, resting comfortably.  No specific complaints this morning.  Abdominal discomfort improving.  No reported bowel movement since hospitalization.  No other questions or concerns at this time.  Denies headache, no dizziness, no chest pain, no palpitations, no current fever, no chills/night sweats, no current nausea/vomiting/diarrhea, no cough/congestion, no focal weakness, no fatigue, no paresthesias.  No acute events overnight per nursing staff.  Objective: Vitals:   06/22/22 1650 06/22/22 2240 06/23/22 0515 06/23/22 0918  BP:  (!) 152/57 (!) 150/47 (!) 155/59  Pulse:  94 84 85  Resp: (!) '24 16 17   '$ Temp:  98.4 F (36.9 C) 97.6 F (36.4 C)   TempSrc:  Oral Oral   SpO2:  97% 98%   Weight:      Height:        Intake/Output Summary (Last 24 hours) at 06/23/2022 1137 Last data filed at 06/23/2022 0524 Gross per 24 hour  Intake 912.35 ml  Output 200 ml  Net 712.35 ml   Filed Weights   06/22/22 0945  Weight: 64.4 kg    Examination:  Physical Exam: GEN: NAD, alert and oriented x 3, chronically ill/elderly appearance HEENT: NCAT, PERRL, EOMI, sclera clear, MMM PULM: CTAB w/o wheezes/crackles, normal respiratory effort, on room air CV: RRR w/o M/G/R GI: abd soft, nondistended, mild tenderness to palpation suprapubic region and left lower quadrant, NABS, no R/G/M MSK: no peripheral edema, moves all extremities independently NEURO: CN II-XII intact, no focal deficits,  sensation to light touch intact PSYCH: normal mood/affect Integumentary: Multiple areas of ecchymosis in various stages of healing, otherwise no other concerning rashes/lesions/wounds noted on exposed skin surfaces     Data Reviewed: I have personally reviewed following labs and imaging studies  CBC: Recent Labs  Lab 06/22/22 0955 06/23/22 0416  WBC 10.7* 9.3  NEUTROABS 8.7*  --   HGB 10.3* 8.3*  HCT 31.5* 26.2*  MCV 96.9 99.2  PLT 150 387*   Basic Metabolic Panel: Recent Labs  Lab 06/22/22 0955 06/23/22 0416  NA 135 135  K 4.7 5.2*  CL 109 110  CO2 17* 19*  GLUCOSE 125* 230*  BUN 76* 78*  CREATININE 2.18* 2.16*  CALCIUM 8.7* 8.0*   GFR: Estimated Creatinine Clearance: 16.1 mL/min (A) (by C-G formula based on SCr of 2.16 mg/dL (H)). Liver Function Tests: Recent Labs  Lab 06/22/22 0955 06/23/22 0416  AST 51* 39  ALT 48* 39  ALKPHOS 99 78  BILITOT 0.7 0.7  PROT 6.0* 4.8*  ALBUMIN 3.4* 2.6*   Recent Labs  Lab 06/22/22 0955  LIPASE 66*   No results for input(s): "AMMONIA" in the last 168 hours. Coagulation Profile: Recent Labs  Lab 06/22/22 0955 06/23/22 0416  INR 2.3* 2.5*   Cardiac Enzymes: No results for input(s): "CKTOTAL", "CKMB", "CKMBINDEX", "TROPONINI" in the last 168 hours. BNP (last 3 results) No results for input(s): "PROBNP" in the last 8760 hours. HbA1C: No results for input(s): "HGBA1C" in the last 72 hours. CBG: Recent Labs  Lab 06/22/22 1655 06/22/22 2238 06/23/22 0750 06/23/22 1134  GLUCAP 147* 161* 197* 221*   Lipid Profile: No results for input(s): "CHOL", "HDL", "LDLCALC", "TRIG", "  CHOLHDL", "LDLDIRECT" in the last 72 hours. Thyroid Function Tests: No results for input(s): "TSH", "T4TOTAL", "FREET4", "T3FREE", "THYROIDAB" in the last 72 hours. Anemia Panel: Recent Labs    06/22/22 1530  TIBC 267  IRON 8*   Sepsis Labs: Recent Labs  Lab 06/22/22 1036 06/22/22 1530  LATICACIDVEN 1.3 1.6    Recent Results  (from the past 240 hour(s))  Urine Culture     Status: Abnormal (Preliminary result)   Collection Time: 06/22/22 10:30 AM   Specimen: In/Out Cath Urine  Result Value Ref Range Status   Specimen Description   Final    IN/OUT CATH URINE Performed at [facility], [place] [address] [place], [place] [address]    Special Requests   Final    NONE Performed at [facility], [place] [address] [place], [place] [address]    Culture >=100,000 COLONIES/mL GRAM NEGATIVE RODS (A)  Final   Report Status PENDING  Incomplete  Blood Culture (routine x 2)     Status: None (Preliminary result)   Collection Time: 06/22/22 10:40 AM   Specimen: BLOOD  Result Value Ref Range Status   Specimen Description   Final    BLOOD RIGHT ANTECUBITAL Performed at [facility] [address], [place], [place] [address]    Special Requests   Final    BOTTLES DRAWN AEROBIC AND ANAEROBIC Blood Culture adequate volume Performed at [facility] [address] [place], [place] [address]    Culture   Final    NO GROWTH < 24 HOURS Performed at [facility] [address], [place], [place] [address]    Report Status PENDING  Incomplete  Blood Culture (routine x 2)     Status: None (Preliminary result)   Collection Time: 06/22/22 10:45 AM   Specimen: BLOOD LEFT HAND  Result Value Ref Range Status   Specimen Description   Final    BLOOD LEFT HAND Performed at [facility] [address], [place], [place] [address]    Special Requests   Final    BOTTLES DRAWN AEROBIC ONLY Blood Culture adequate volume Performed at [facility] [address] [place], [place] [address]    Culture   Final    NO GROWTH < 24 HOURS Performed at [facility] [address] [place], [place] [address]    Report Status PENDING  Incomplete         Radiology Studies: CT ABDOMEN PELVIS WO CONTRAST  Result  Date: 06/22/2022 CLINICAL DATA:  Left lower quadrant abdominal pain and nausea. Sepsis. EXAM: CT ABDOMEN AND PELVIS WITHOUT CONTRAST TECHNIQUE: Multidetector CT imaging of the abdomen and pelvis was performed following the standard protocol without IV contrast. RADIATION DOSE REDUCTION: This exam was performed according to the departmental dose-optimization program which includes automated exposure control, adjustment of the mA and/or kV according to patient size and/or use of iterative reconstruction technique. COMPARISON:  CT abdomen and pelvis 04/18/2022 FINDINGS: Lower chest: New trace right pleural effusion. Atelectasis in both lower lobes. Large calcified granuloma in the left lower lobe. Coronary atherosclerosis. Hepatobiliary: Scattered calcified granulomas in the liver. Cholecystectomy. No biliary dilatation. Pancreas: Unremarkable. Spleen: Calcified granuloma. Adrenals/Urinary Tract: Unremarkable adrenal glands. Renal vascular calcifications. No evidence of a renal mass, hydronephrosis, or definite urinary tract calculi. Extensive gas and debris in the bladder with some of the gas appearing to be within the bladder wall. Stomach/Bowel: The stomach is unremarkable. A  duodenal diverticulum is again noted. There is no evidence of bowel obstruction. Diverticulosis is again noted of the sigmoid colon with persistent wall thickening and surrounding inflammatory changes which are greatest near the proximal sigmoid colon. The inflammation more localized to the proximal sigmoid colon is stable to mildly increased compared to the prior study, however the degree of overall inflammation throughout the pelvis is less than on the prior CT. Vascular/Lymphatic: Extensive abdominal aortic atherosclerosis without aneurysm. No enlarged lymph nodes. Reproductive: Status post hysterectomy.  No gross adnexal mass. Other: Minimal pelvic free fluid. No organized fluid collection or pneumoperitoneum. Nonspecific subcutaneous  stranding/edema in the lower abdominal wall and proximal thighs, right greater than left. Musculoskeletal: No suspicious osseous lesion. Asymmetrically advanced right hip osteoarthrosis. Chronic L4 compression fracture. IMPRESSION: 1. Extensive gas and debris in the bladder with some gas appearing to be within the bladder wall, concerning for emphysematous cystitis. A fistula between the bladder and either bowel or vagina is not excluded given this appearance, however a discrete fistula is not readily apparent on today's CT, and a colovesical fistula was not shown on a pelvic CT utilizing rectal contrast in July. 2. Persistent/recurrent sigmoid diverticulitis. No evidence of abscess. 3. New trace right pleural effusion. 4. Aortic Atherosclerosis (ICD10-I70.0) and Emphysema (ICD10-J43.9). Electronically Signed   By: Logan Bores M.D.   On: 06/22/2022 11:32   DG Chest Port 1 View  Result Date: 06/22/2022 CLINICAL DATA:  Sepsis. EXAM: PORTABLE CHEST 1 VIEW COMPARISON:  April 18, 2022. FINDINGS: Stable cardiomediastinal silhouette. Status post aortic valve replacement. Increased bibasilar atelectasis or edema is noted with small pleural effusions. Bony thorax is unremarkable. IMPRESSION: Increased bibasilar atelectasis or edema is noted with small pleural effusions. Electronically Signed   By: Marijo Conception M.D.   On: 06/22/2022 11:15        Scheduled Meds:  allopurinol  100 mg Oral Daily   ALPRAZolam  0.25-0.5 mg Oral QHS   amLODipine  10 mg Oral Daily   aspirin EC  81 mg Oral Daily   cloNIDine  0.2 mg Oral BID   cyanocobalamin  1,000 mcg Oral Q lunch   folic acid  1 mg Oral Daily   hydrALAZINE  100 mg Oral TID   insulin aspart  0-6 Units Subcutaneous TID WC   metoprolol succinate  12.5 mg Oral Daily   predniSONE  20 mg Oral Q breakfast   sodium bicarbonate  650 mg Oral TID   tadalafil  40 mg Oral QHS   vancomycin  125 mg Oral BID   warfarin  5 mg Oral ONCE-1600   Warfarin - Pharmacist Dosing  Inpatient   Does not apply q1600   Continuous Infusions:  meropenem (MERREM) IV 1 g (06/23/22 0917)     LOS: 1 day    Time spent: 52 minutes spent on chart review, discussion with nursing staff, consultants, updating family and interview/physical exam; more than 50% of that time was spent in counseling and/or coordination of care.    Micael Barb J [place] DO [facility] Available via Epic secure chat 7am-7pm After these hours, please refer to coverage provider listed on [email or web address] 06/23/2022, 11:37 AM

## 2022-06-24 DIAGNOSIS — K5792 Diverticulitis of intestine, part unspecified, without perforation or abscess without bleeding: Secondary | ICD-10-CM | POA: Diagnosis not present

## 2022-06-24 DIAGNOSIS — Z7901 Long term (current) use of anticoagulants: Secondary | ICD-10-CM | POA: Diagnosis not present

## 2022-06-24 DIAGNOSIS — I5032 Chronic diastolic (congestive) heart failure: Secondary | ICD-10-CM | POA: Diagnosis not present

## 2022-06-24 DIAGNOSIS — N390 Urinary tract infection, site not specified: Secondary | ICD-10-CM | POA: Diagnosis not present

## 2022-06-24 DIAGNOSIS — E7800 Pure hypercholesterolemia, unspecified: Secondary | ICD-10-CM

## 2022-06-24 DIAGNOSIS — I7782 Antineutrophilic cytoplasmic antibody (ANCA) vasculitis: Secondary | ICD-10-CM

## 2022-06-24 LAB — BASIC METABOLIC PANEL
Anion gap: 7 (ref 5–15)
BUN: 80 mg/dL — ABNORMAL HIGH (ref 8–23)
CO2: 18 mmol/L — ABNORMAL LOW (ref 22–32)
Calcium: 8.2 mg/dL — ABNORMAL LOW (ref 8.9–10.3)
Chloride: 113 mmol/L — ABNORMAL HIGH (ref 98–111)
Creatinine, Ser: 2.23 mg/dL — ABNORMAL HIGH (ref 0.44–1.00)
GFR, Estimated: 21 mL/min — ABNORMAL LOW (ref >60)
Glucose, Bld: 259 mg/dL — ABNORMAL HIGH (ref 70–99)
Potassium: 5 mmol/L (ref 3.5–5.1)
Sodium: 138 mmol/L (ref 135–145)

## 2022-06-24 LAB — PROTIME-INR
INR: 2.7 — ABNORMAL HIGH (ref 0.8–1.2)
Prothrombin Time: 28.3 seconds — ABNORMAL HIGH (ref 11.4–15.2)

## 2022-06-24 LAB — CBC
HCT: 25.6 % — ABNORMAL LOW (ref 36.0–46.0)
Hemoglobin: 8.2 g/dL — ABNORMAL LOW (ref 12.0–15.0)
MCH: 31.8 pg (ref 26.0–34.0)
MCHC: 32 g/dL (ref 30.0–36.0)
MCV: 99.2 fL (ref 80.0–100.0)
Platelets: 124 10*3/uL — ABNORMAL LOW (ref 150–400)
RBC: 2.58 MIL/uL — ABNORMAL LOW (ref 3.87–5.11)
RDW: 16.4 % — ABNORMAL HIGH (ref 11.5–15.5)
WBC: 9.7 10*3/uL (ref 4.0–10.5)
nRBC: 0 % (ref 0.0–0.2)

## 2022-06-24 LAB — GLUCOSE, CAPILLARY
Glucose-Capillary: 198 mg/dL — ABNORMAL HIGH (ref 70–99)
Glucose-Capillary: 177 mg/dL — ABNORMAL HIGH (ref 70–99)
Glucose-Capillary: 265 mg/dL — ABNORMAL HIGH (ref 70–99)
Glucose-Capillary: 298 mg/dL — ABNORMAL HIGH (ref 70–99)

## 2022-06-24 MED ORDER — INSULIN GLARGINE-YFGN 100 UNIT/ML ~~LOC~~ SOLN
14.0000 [IU] | Freq: Every day | SUBCUTANEOUS | Status: DC
  Administered 2022-06-24 – 2022-06-25 (×2): 14 [IU] via SUBCUTANEOUS
  Filled 2022-06-24 (×2): qty 0.14

## 2022-06-24 MED ORDER — WARFARIN SODIUM 5 MG PO TABS
5.0000 mg | ORAL_TABLET | Freq: Once | ORAL | Status: AC
  Administered 2022-06-24: 5 mg via ORAL
  Filled 2022-06-24: qty 1

## 2022-06-24 MED ORDER — INSULIN ASPART 100 UNIT/ML IJ SOLN
2.0000 [IU] | Freq: Once | INTRAMUSCULAR | Status: AC
  Administered 2022-06-24: 2 [IU] via SUBCUTANEOUS

## 2022-06-24 MED ORDER — FUROSEMIDE 40 MG PO TABS
40.0000 mg | ORAL_TABLET | Freq: Two times a day (BID) | ORAL | Status: DC
  Administered 2022-06-24 – 2022-06-25 (×2): 40 mg via ORAL
  Filled 2022-06-24 (×2): qty 1

## 2022-06-24 NOTE — Evaluation (Signed)
Occupational Therapy Evaluation Patient Details Name: [name] MRN: [id number] DOB: August 20, 1941 Today's Date: 06/24/2022   History of Present Illness [name] is a 81 y.o. female with past medical history significant for type 2 diabetes mellitus, hemolytic anemia, HLD, CKD stage 4, vasculitis, paroxysmal atrial fibrillation on Coumadin, pulmonary artery hypertension, chronic diastolic congestive heart failure, history of diverticulitis, history of ESBL UTI, history of C. difficile colitis who presented to [facility] ED on 9/14 via EMS with a chief complaint of lower abdominal pain, nausea/diarrhea.  Patient reports she has had chronic diarrhea since diagnosis of C. difficile colitis in July 2023.   Clinical Impression   Patient is a 81 year old female who was admitted for above. Patient was living at home with family and friend support prior level. Patient expressed that she thinks she may have to go live with family at time of d/c seocndary to weakness and increased need for caregiver assistance with IADL tasks. Patient was noted to have decreased functional activity tolerance, decreased endurance, decreased safety awareness, and decreased knowledge of AD/AE impacting participation in ADLs. Patient would continue to benefit from skilled OT services at this time while admitted to address noted deficits in order to improve overall safety and independence in ADLs.         Recommendations for follow up therapy are one component of a multi-disciplinary discharge planning process, led by the attending physician.  Recommendations may be updated based on patient status, additional functional criteria and insurance authorization.   Follow Up Recommendations  No OT follow up    Assistance Recommended at Discharge Frequent or constant Supervision/Assistance  Patient can return home with the following A little help with walking and/or transfers;A little help with bathing/dressing/bathroom;Assistance  with cooking/housework;Direct supervision/assist for financial management;Assist for transportation;Help with stairs or ramp for entrance;Direct supervision/assist for medications management    Functional Status Assessment  Patient has had a recent decline in their functional status and demonstrates the ability to make significant improvements in function in a reasonable and predictable amount of time.  Equipment Recommendations  Other (comment) (patient repoted having needed equipment at home)    Recommendations for Other Services       Precautions / Restrictions Precautions Precaution Comments: ESBBL, VRE Restrictions Weight Bearing Restrictions: No      Mobility Bed Mobility Overal bed mobility: Modified Independent                  Transfers Overall transfer level: Needs assistance   Transfers: Sit to/from Stand Sit to Stand: Supervision           General transfer comment: with increased time      Balance Overall balance assessment: No apparent balance deficits (not formally assessed)                                         ADL either performed or assessed with clinical judgement   ADL Overall ADL's : Needs assistance/impaired Eating/Feeding: Set up;Sitting   Grooming: Wash/dry face;Set up;Sitting;Brushing hair Grooming Details (indicate cue type and reason): EOB Upper Body Bathing: Sitting;Set up   Lower Body Bathing: Minimal assistance;Sitting/lateral leans Lower Body Bathing Details (indicate cue type and reason): fatigue level with simulated tasks Upper Body Dressing : Set up;Sitting   Lower Body Dressing: Minimal assistance;Sit to/from stand;Sitting/lateral leans   Toilet Transfer: Minimal assistance;Rolling walker (2 wheels) Toilet Transfer Details (indicate  cue type and reason): with increased time Toileting- Clothing Manipulation and Hygiene: Minimal assistance;Sit to/from stand         General ADL Comments: patient  reported feeling like she would need to live with one of her children at time of d/c as she feels weakers and knows that making food would not be easy for her and that she needs intake to be successful in the next level of care. patient;s thoughts and concerns were heard and validated. patient was educated that chaplin services were avilalbe here to discuss changes with her to be able to voice concerns further. patient verbalized undersatnding and asked for them to be paged. page was sent to chaplin pager. patient wasked for nutritionist consult. nurse was messaged.     Vision Patient Visual Report: No change from baseline       Perception     Praxis      Pertinent Vitals/Pain Pain Assessment Pain Assessment: No/denies pain     Hand Dominance Right   Extremity/Trunk Assessment Upper Extremity Assessment Upper Extremity Assessment: Overall WFL for tasks assessed   Lower Extremity Assessment Lower Extremity Assessment: Defer to PT evaluation (BLE noted to have edema in feet)   Cervical / Trunk Assessment Cervical / Trunk Assessment: Normal   Communication Communication Communication: No difficulties   Cognition Arousal/Alertness: Awake/alert Behavior During Therapy: WFL for tasks assessed/performed Overall Cognitive Status: Within Functional Limits for tasks assessed                                       General Comments       Exercises     Shoulder Instructions      Home Living Family/patient expects to be discharged to:: Private residence Living Arrangements: Alone Available Help at Discharge: Family;Available 24 hours/day;Friend(s) Type of Home: House Home Access: Stairs to enter [employer] of Steps: 3 Entrance Stairs-Rails: Right;Left Home Layout: One level     Bathroom Shower/Tub: Teacher, early years/pre: Standard Bathroom Accessibility: Yes   Home Equipment: Conservation officer, nature (2 wheels);Cane - single point;Shower  seat;Crutches;Grab bars - toilet;Wheelchair - manual   Additional Comments: children and friend able to assist prn, friend stays at night      Prior Functioning/Environment Prior Level of Function : Independent/Modified Independent             Mobility Comments: walks with RW or cane, no falls in past 6 months ADLs Comments: independent with ADL, family assists with grocery shopping, pt reports she was independent with cooking, patient reported being at home alone majority of time during the day.        OT Problem List: Decreased strength;Decreased activity tolerance;Decreased knowledge of use of DME or AE;Decreased safety awareness;Cardiopulmonary status limiting activity;Decreased knowledge of precautions;Increased edema      OT Treatment/Interventions: Self-care/ADL training;Therapeutic exercise;Neuromuscular education;Energy conservation;DME and/or AE instruction;Therapeutic activities;Balance training;Patient/family education    OT Goals(Current goals can be found in the care plan section) Acute Rehab OT Goals Patient Stated Goal: to get stronger OT Goal Formulation: With patient Time For Goal Achievement: 07/08/22 Potential to Achieve Goals: Fair  OT Frequency: Min 2X/week    Co-evaluation              AM-PAC OT "6 Clicks" Daily Activity     Outcome Measure Help from another person eating meals?: None Help from another person taking care of personal grooming?: A Little  Help from another person toileting, which includes using toliet, bedpan, or urinal?: A Lot Help from another person bathing (including washing, rinsing, drying)?: A Lot Help from another person to put on and taking off regular upper body clothing?: A Little Help from another person to put on and taking off regular lower body clothing?: A Little 6 Click Score: 17   End of Session Equipment Utilized During Treatment: Gait belt;Rolling walker (2 wheels) Nurse Communication: Other (comment) (ok to  participate in session)  Activity Tolerance: Patient tolerated treatment well;Patient limited by fatigue Patient left: in bed;with call bell/phone within reach;with bed alarm set  OT Visit Diagnosis: Unsteadiness on feet (R26.81);Other abnormalities of gait and mobility (R26.89);Muscle weakness (generalized) (M62.81)                Time: 2233-6122 OT Time Calculation (min): 17 min Charges:  OT General Charges $OT Visit: 1 Visit OT Evaluation $OT Eval Moderate Complexity: 1 Mod  Lionel Woodberry OTR/L, MS Acute Rehabilitation Department Office# [phone]   Feliz Beam [place] 06/24/2022, 4:03 PM

## 2022-06-24 NOTE — Progress Notes (Signed)
ANTICOAGULATION CONSULT NOTE - Follow Up Consult  Pharmacy Consult for Coumadin Indication:  afib, mech AVR  Allergies  Allergen Reactions   Flagyl [Metronidazole] Rash and Other (See Comments)    ALL-OVER BODY RASH   Augmentin [Amoxicillin-Pot Clavulanate] Diarrhea and Nausea And Vomiting   Ciprofloxacin Nausea Only and Other (See Comments)    Stomach ache   Coreg [Carvedilol] Other (See Comments)    Terrible cramping in the feet and had a lot of bowel movements, but not diarrhea   Diflucan [Fluconazole] Diarrhea   Losartan Swelling and Other (See Comments)    Patient doesn't recall site of swelling   Privigen [Immune Globulin (Human)] Other (See Comments)    Severe/excruciating pain   Sulfamethoxazole-Trimethoprim Nausea Only and Other (See Comments)    Stomach upset   Verapamil Hives   Zetia [Ezetimibe] Other (See Comments)    Reaction not recalled   Zocor [Simvastatin - High Dose] Other (See Comments)    Reaction not recalled   Gatifloxacin Rash and Other (See Comments)    Redness to skin around eye    Patient Measurements: Height: '4\' 11"'$  (149.9 cm) Weight: 64.4 kg (142 lb) IBW/kg (Calculated) : 43.2  Vital Signs: Temp: 98.1 F (36.7 C) (09/16 1315) Temp Source: Oral (09/16 1315) BP: 150/52 (09/16 1315) Pulse Rate: 88 (09/16 1315)  Labs: Recent Labs    06/22/22 0955 06/23/22 0416 06/24/22 0424  HGB 10.3* 8.3* 8.2*  HCT 31.5* 26.2* 25.6*  PLT 150 101* 124*  APTT 36  --   --   LABPROT 24.8* 26.4* 28.3*  INR 2.3* 2.5* 2.7*  CREATININE 2.18* 2.16* 2.23*     Estimated Creatinine Clearance: 15.6 mL/min (A) (by C-G formula based on SCr of 2.23 mg/dL (H)).   Assessment:  AC/Heme: Warfarin PTA for PAF, mech AVR. INR 2.5. Hgb 10.3>8.3 - PTA Coum '5mg'$  daily except 7.'5mg'$  Sun/Fri, admit INR 2.3 (goal 2-3) - Iron 8 low, Sats 3 low, (autoimmune hemolytic anemia,)  Today, 06/24/22 Hgb 8.2 low but stable Plt 124  INR 2.7    Goal of Therapy:  INR  2-3 Monitor platelets by anticoagulation protocol: Yes   Plan:  Warfarin '5mg'$  po x 1 today Daily INR    Royetta Asal, PharmD, BCPS 06/24/2022 2:06 PM

## 2022-06-24 NOTE — Evaluation (Signed)
Physical Therapy Evaluation Patient Details Name: [name] MRN: [id number] DOB: 08-10-1941 Today's Date: 06/24/2022  History of Present Illness  [name] is a 81 y.o. female with past medical history significant for type 2 diabetes mellitus, hemolytic anemia, HLD, CKD stage 4, vasculitis, paroxysmal atrial fibrillation on Coumadin, pulmonary artery hypertension, chronic diastolic congestive heart failure, history of diverticulitis, history of ESBL UTI, history of C. difficile colitis who presented to [facility] ED on 9/14 via EMS with a chief complaint of lower abdominal pain, nausea/diarrhea.  Patient reports she has had chronic diarrhea since diagnosis of C. difficile colitis in July 2023.  Clinical Impression  The patient  admitted for above medical problems.  Patient is eager to progress ambulation, has been up to BR several times. Patient ambulated x 100' with RW.[name]  Patient  plans to return home with family and friends assisting. Patient uses rollator at baseline  .  HR 105.  Pt admitted with above diagnosis.  Pt currently with functional limitations due to the deficits listed below (see PT Problem List). Pt will benefit from skilled PT to increase their independence and safety with mobility to allow discharge to the venue listed below.        Recommendations for follow up therapy are one component of a multi-disciplinary discharge planning process, led by the attending physician.  Recommendations may be updated based on patient status, additional functional criteria and insurance authorization.  Follow Up Recommendations No PT follow up      Assistance Recommended at Discharge PRN  Patient can return home with the following  A little help with bathing/dressing/bathroom;Help with stairs or ramp for entrance;Assistance with cooking/housework;Assist for transportation    Equipment Recommendations None recommended by PT  Recommendations for Other Services       Functional Status  Assessment Patient has had a recent decline in their functional status and demonstrates the ability to make significant improvements in function in a reasonable and predictable amount of time.     Precautions / Restrictions Precautions Precaution Comments: ESBBL, VRE Restrictions Weight Bearing Restrictions: No      Mobility  Bed Mobility Overal bed mobility: Modified Independent                  Transfers Overall transfer level: Needs assistance   Transfers: Sit to/from Stand Sit to Stand: Supervision                Ambulation/Gait Ambulation/Gait assistance: Supervision Gait Distance (Feet): 100 Feet Assistive device: Rolling walker (2 wheels) Gait Pattern/deviations: Step-through pattern Gait velocity: decr     General Gait Details: gait steady with Rw, no loss of balance  Stairs            Wheelchair Mobility    Modified Rankin (Stroke Patients Only)       Balance Overall balance assessment: No apparent balance deficits (not formally assessed)                                           Pertinent Vitals/Pain Pain Assessment Pain Assessment: No/denies pain    Home Living Family/patient expects to be discharged to:: Private residence Living Arrangements: Alone Available Help at Discharge: Family;Available 24 hours/day;Friend(s) Type of Home: House Home Access: Stairs to enter Entrance Stairs-Rails: Psychiatric nurse of Steps: 3   Home Layout: One level Home Equipment: Conservation officer, nature (2 wheels);Cane - single  point;Shower seat;Crutches;Grab bars - toilet;Wheelchair - manual Additional Comments: children and friend able to assist prn, friend stays at night    Prior Function Prior Level of Function : Independent/Modified Independent             Mobility Comments: walks with RW or cane, no falls in past 6 months ADLs Comments: independent with ADL, family assists with grocery shopping, pt reports she  was independent with cooking     Hand Dominance        Extremity/Trunk Assessment   Upper Extremity Assessment Upper Extremity Assessment: Overall WFL for tasks assessed    Lower Extremity Assessment Lower Extremity Assessment:  (vilateral edema , L>R, does wraps at home)    Cervical / Trunk Assessment Cervical / Trunk Assessment: Normal  Communication      Cognition Arousal/Alertness: Awake/alert Behavior During Therapy: WFL for tasks assessed/performed Overall Cognitive Status: Within Functional Limits for tasks assessed                                          General Comments      Exercises     Assessment/Plan    PT Assessment Patient needs continued PT services  PT Problem List Decreased strength;Decreased mobility;Decreased balance;Decreased activity tolerance;Decreased skin integrity       PT Treatment Interventions DME instruction;Therapeutic activities;Gait training;Therapeutic exercise;Patient/family education;Functional mobility training    PT Goals (Current goals can be found in the Care Plan section)  Acute Rehab PT Goals Patient Stated Goal: feel better, stay out of hospital; PT Goal Formulation: With patient Time For Goal Achievement: 07/08/22 Potential to Achieve Goals: Good    Frequency Min 3X/week     Co-evaluation               AM-PAC PT "6 Clicks" Mobility  Outcome Measure Help needed turning from your back to your side while in a flat bed without using bedrails?: None Help needed moving from lying on your back to sitting on the side of a flat bed without using bedrails?: None Help needed moving to and from a bed to a chair (including a wheelchair)?: A Little Help needed standing up from a chair using your arms (e.g., wheelchair or bedside chair)?: A Little Help needed to walk in hospital room?: A Little Help needed climbing 3-5 steps with a railing? : A Little 6 Click Score: 20    End of Session   Activity  Tolerance: Patient tolerated treatment well Patient left: in bed;with call bell/phone within reach;with bed alarm set Nurse Communication: Mobility status PT Visit Diagnosis: Unsteadiness on feet (R26.81);Difficulty in walking, not elsewhere classified (R26.2)    Time: 0814-4818 PT Time Calculation (min) (ACUTE ONLY): 18 min   Charges:   PT Evaluation $PT Eval Low Complexity: 1 Low          [place] Office [phone] Weekend [phone]   Claretha Cooper 06/24/2022, 12:24 PM

## 2022-06-24 NOTE — Progress Notes (Signed)
PROGRESS NOTE    [name]  YHC:[id number] DOB: [birth year] DOA: 06/22/2022 PCP: [name], MD    Brief Narrative:   [name] is a 81 y.o. female with past medical history significant for type 2 diabetes mellitus, hemolytic anemia, HLD, CKD stage 4, vasculitis, paroxysmal atrial fibrillation on Coumadin, pulmonary artery hypertension, chronic diastolic congestive heart failure, history of diverticulitis, history of ESBL UTI, history of C. difficile colitis who presented to [facility] ED on 9/14 via EMS with a chief complaint of lower abdominal pain, nausea/diarrhea.  Patient reports she has had chronic diarrhea since diagnosis of C. difficile colitis in July 2023.  However, yesterday her episodes of diarrhea seem to worsen.  Also reports sharp pains in her lower abdomen, worse on the right.  Denies hematemesis.  Also notable burning with urination.  When her symptoms did not improve she decided to come to ED for further evaluation.  In the ED, temperature 101.8 F, HR 101, RR 26, BP 177/56, SPO2 96% on room air.  WBC 10.7, hemoglobin 10.3, platelets 150.  Sodium 135, potassium 4.7, chloride 109, CO2 17, BUN 76, creatinine 2.18.  Glucose 125.  AST 51, ALT 48, total bilirubin 0.7.  Lipase 66.  Lactic acid 1.3.  INR 2.3.  Urinalysis with large leukocytes, negative nitrite, many bacteria, greater than 50 WBCs.  CT abdomen/pelvis with extensive gas and debris within the bladder and some gas appearing to be within the bladder wall concerning for emphysematous cystitis, persistent/recurrent sigmoid diverticulitis with no evidence of abscess, new trace right pleural effusion.  Urine culture and blood cultures obtained.  Assessment & Plan:   Sepsis, POA E. coli/Klebsiella pneumonia UTI Patient presenting with abdominal pain, increased urinary frequency/dysuria.  History of ESBL UTI.  Urinalysis consistent with UTI.  Patient with temperature 101.8 F, tachycardic, tachypneic, hypertensive on  admission with elevated WBC count of 10.7. --WBC 10.7>9.3>9.7 --Urine culture: + >100K Ecoli and 30K Klebsiella pneumonia, await further identification/susceptibilities --Blood cultures x2: Pending --Continue meropenem --CBC daily  Recurrent diverticulitis CT abdomen/pelvis with noted persistent/recurrent sigmoid diverticulitis with no evidence of abscess on admission. --Continue meropenem  Hx C. difficile colitis --C. difficile PCR: Pending --Continue vancomycin 125 mg PO BID for C. difficile prophylaxis while on broad-spectrum antibiotics with meropenem as above. --Enteric precautions  Type 2 diabetes mellitus Home regimen includes Tresiba 9-14 units subcutaneously daily, NovoLog sliding scale.  Hemoglobin A1c 6.3 on 04/27/2022, well controlled. --Semglee 14u [place] daily --SSI for coverage --CBGs qAC/HS  Paroxysmal atrial fibrillation --Metoprolol succinate 12.5 mg p.o. daily --Continue warfarin, pharmacy consulted for dosing/monitoring, INR daily  Chronic diastolic congestive heart failure, compensated --Continue metoprolol succinate 12.5 mg p.o. daily --Furosemide 40 mg p.o. twice daily --Hold home spironolactone  Essential hypertension --amlodipine 10 mg p.o. daily --hydralazine 100 mg p.o. 3 times daily --metoprolol succinate 12.5 mg p.o. daily --clonidine 0.2 mg p.o. twice daily --Restart furosemide at reduced dose 40 mg p.o. twice daily today --Holding spironolactone --Continue aspirin and statin --Continue monitor BP closely  CKD stage IV Baseline creatinine 2.0-2.4; creatinine 2.23; stable --Avoid nephrotoxins, renal dose all medications --BMP daily  Hyperlipidemia --Atorvastatin 80 mg p.o. daily  Anxiety: Alprazolam 0.25-0.5 mg p.o. nightly  Pulmonary artery hypertension --Continue tadalafil 40 mg p.o. nightly --Prednisone 20 mg p.o. daily  GERD: Hold PPI with history of C. difficile colitis  Weakness/deconditioning/gait disturbance: Patient currently  lives alone.  Appears to have poor social support; now with recurrent hospitalizations --PT/OT evaluation   DVT prophylaxis:  Code Status: DNR Family Communication: No family present at bedside this morning  Disposition Plan:  Level of care: Telemetry Status is: Inpatient Remains inpatient appropriate because: Continues on IV antibiotics, pending PT/OT evaluation, need finalized urine culture before stable for discharge home.  Anticipate discharge home likely tomorrow    Consultants:  None  Procedures:  None  Antimicrobials:  Meropenem 9/14>> Oral vancomycin 9/15>>   Subjective: Patient seen and examined at bedside, resting comfortably.  No specific complaints this morning.  Sitting on edge of bed.  Wishes she can go home today.  Discussed needs finalized urine culture given her previous antibiotic resistance patterns in the past.  Patient understanding.  No other specific complaints or concerns at this time.  Also pending PT/OT evaluation.  Denies headache, no dizziness, no chest pain, no palpitations, no current fever, no chills/night sweats, no current nausea/vomiting/diarrhea, no cough/congestion, no focal weakness, no fatigue, no paresthesias.  No acute events overnight per nursing staff.  Objective: Vitals:   06/23/22 0918 06/23/22 1350 06/23/22 2142 06/24/22 0517  BP: (!) 155/59 (!) 141/48 (!) 128/59 (!) 133/52  Pulse: 85 84 78 76  Resp:  '18 17 18  '$ Temp:  98.6 F (37 C) 97.8 F (36.6 C) 98 F (36.7 C)  TempSrc:  Oral  Oral  SpO2:  97% 97% 97%  Weight:      Height:        Intake/Output Summary (Last 24 hours) at 06/24/2022 1122 Last data filed at 06/24/2022 0500 Gross per 24 hour  Intake 480 ml  Output 400 ml  Net 80 ml   Filed Weights   06/22/22 0945  Weight: 64.4 kg    Examination:  Physical Exam: GEN: NAD, alert and oriented x 3, chronically ill/elderly appearance HEENT: NCAT, PERRL, EOMI, sclera clear, MMM PULM: CTAB w/o wheezes/crackles,  normal respiratory effort, on room air CV: RRR w/o M/G/R GI: abd soft, nondistended, mild tenderness to palpation suprapubic region and left lower quadrant, NABS, no R/G/M MSK: 1+ pitting edema bilateral lower extremities to mid shin, moves all extremities independently NEURO: CN II-XII intact, no focal deficits, sensation to light touch intact PSYCH: normal mood/affect Integumentary: Multiple areas of ecchymosis in various stages of healing, otherwise no other concerning rashes/lesions/wounds noted on exposed skin surfaces     Data Reviewed: I have personally reviewed following labs and imaging studies  CBC: Recent Labs  Lab 06/22/22 0955 06/23/22 0416 06/24/22 0424  WBC 10.7* 9.3 9.7  NEUTROABS 8.7*  --   --   HGB 10.3* 8.3* 8.2*  HCT 31.5* 26.2* 25.6*  MCV 96.9 99.2 99.2  PLT 150 101* 409*   Basic Metabolic Panel: Recent Labs  Lab 06/22/22 0955 06/23/22 0416 06/24/22 0424  NA 135 135 138  K 4.7 5.2* 5.0  CL 109 110 113*  CO2 17* 19* 18*  GLUCOSE 125* 230* 259*  BUN 76* 78* 80*  CREATININE 2.18* 2.16* 2.23*  CALCIUM 8.7* 8.0* 8.2*   GFR: Estimated Creatinine Clearance: 15.6 mL/min (A) (by C-G formula based on SCr of 2.23 mg/dL (H)). Liver Function Tests: Recent Labs  Lab 06/22/22 0955 06/23/22 0416  AST 51* 39  ALT 48* 39  ALKPHOS 99 78  BILITOT 0.7 0.7  PROT 6.0* 4.8*  ALBUMIN 3.4* 2.6*   Recent Labs  Lab 06/22/22 0955  LIPASE 66*   No results for input(s): "AMMONIA" in the last 168 hours. Coagulation Profile: Recent Labs  Lab 06/22/22 0955 06/23/22 0416 06/24/22 0424  INR 2.3* 2.5* 2.7*  Cardiac Enzymes: No results for input(s): "CKTOTAL", "CKMB", "CKMBINDEX", "TROPONINI" in the last 168 hours. BNP (last 3 results) No results for input(s): "PROBNP" in the last 8760 hours. HbA1C: No results for input(s): "HGBA1C" in the last 72 hours. CBG: Recent Labs  Lab 06/23/22 0750 06/23/22 1134 06/23/22 1627 06/23/22 2135 06/24/22 0728   GLUCAP 197* 221* 287* 276* 198*   Lipid Profile: No results for input(s): "CHOL", "HDL", "LDLCALC", "TRIG", "CHOLHDL", "LDLDIRECT" in the last 72 hours. Thyroid Function Tests: No results for input(s): "TSH", "T4TOTAL", "FREET4", "T3FREE", "THYROIDAB" in the last 72 hours. Anemia Panel: Recent Labs    06/22/22 1530  TIBC 267  IRON 8*   Sepsis Labs: Recent Labs  Lab 06/22/22 1036 06/22/22 1530  LATICACIDVEN 1.3 1.6    Recent Results (from the past 240 hour(s))  Urine Culture     Status: Abnormal (Preliminary result)   Collection Time: 06/22/22 10:30 AM   Specimen: In/Out Cath Urine  Result Value Ref Range Status   Specimen Description   Final    IN/OUT CATH URINE Performed at [facility], [place] [address], [place], [place] [address]    Special Requests   Final    NONE Performed at [facility], [place] [address], [place], [place] [address]    Culture (A)  Final    >=100,000 COLONIES/mL ESCHERICHIA COLI 30,000 COLONIES/mL KLEBSIELLA PNEUMONIAE SUSCEPTIBILITIES TO FOLLOW Performed at [facility], [place] [address], [place], [place] [address]    Report Status PENDING  Incomplete   Organism ID, Bacteria ESCHERICHIA COLI (A)  Final      Susceptibility   Escherichia coli - MIC*    AMPICILLIN <=2 SENSITIVE Sensitive     CEFAZOLIN <=4 SENSITIVE Sensitive     CEFEPIME <=0.12 SENSITIVE Sensitive     CEFTRIAXONE <=0.25 SENSITIVE Sensitive     CIPROFLOXACIN <=0.25 SENSITIVE Sensitive     GENTAMICIN <=1 SENSITIVE Sensitive     IMIPENEM <=0.25 SENSITIVE Sensitive     NITROFURANTOIN <=16 SENSITIVE Sensitive     TRIMETH/SULFA <=20 SENSITIVE Sensitive     AMPICILLIN/SULBACTAM <=2 SENSITIVE Sensitive     PIP/TAZO <=4 SENSITIVE Sensitive     * >=100,000 COLONIES/mL ESCHERICHIA COLI  Blood Culture (routine x 2)     Status: None (Preliminary result)   Collection Time: 06/22/22 10:40 AM   Specimen: BLOOD  Result Value Ref Range Status    Specimen Description   Final    BLOOD RIGHT ANTECUBITAL Performed at [facility] [address], [place], [place] [address]    Special Requests   Final    BOTTLES DRAWN AEROBIC AND ANAEROBIC Blood Culture adequate volume Performed at [facility] [address] [place], [place] [address]    Culture   Final    NO GROWTH 2 DAYS Performed at [facility] [address], [place], [place] [address]    Report Status PENDING  Incomplete  Blood Culture (routine x 2)     Status: None (Preliminary result)   Collection Time: 06/22/22 10:45 AM   Specimen: BLOOD LEFT HAND  Result Value Ref Range Status   Specimen Description   Final    BLOOD LEFT HAND Performed at [place] [address], [place], [place] [address]    Special Requests   Final    BOTTLES DRAWN AEROBIC ONLY Blood Culture adequate volume Performed at [facility] [address], [place], [place] [address]  Culture   Final    NO GROWTH 2 DAYS Performed at [facility], [place] [address], [place], [place] [address]    Report Status PENDING  Incomplete         Radiology Studies: No results found.      Scheduled Meds:  allopurinol  100 mg Oral Daily   ALPRAZolam  0.25-0.5 mg Oral QHS   amLODipine  10 mg Oral Daily   aspirin EC  81 mg Oral Daily   atorvastatin  80 mg Oral q1800   cloNIDine  0.2 mg Oral BID   cyanocobalamin  1,000 mcg Oral Q lunch   folic acid  1 mg Oral Daily   hydrALAZINE  100 mg Oral TID   insulin aspart  0-6 Units Subcutaneous TID WC   insulin glargine-yfgn  14 Units Subcutaneous Daily   metoprolol succinate  12.5 mg Oral Daily   predniSONE  20 mg Oral Q breakfast   sodium bicarbonate  650 mg Oral TID   tadalafil  40 mg Oral QHS   vancomycin  125 mg Oral BID   Warfarin - Pharmacist Dosing Inpatient   Does not apply q1600   Continuous Infusions:  meropenem (MERREM) IV 1 g (06/24/22 0953)      LOS: 2 days    Time spent: 48 minutes spent on chart review, discussion with nursing staff, consultants, updating family and interview/physical exam; more than 50% of that time was spent in counseling and/or coordination of care.    Edrees Valent J [place] DO [facility] Available via Epic secure chat 7am-7pm After these hours, please refer to coverage provider listed on [email or web address] 06/24/2022, 11:22 AM

## 2022-06-25 DIAGNOSIS — I5032 Chronic diastolic (congestive) heart failure: Secondary | ICD-10-CM | POA: Diagnosis not present

## 2022-06-25 DIAGNOSIS — N390 Urinary tract infection, site not specified: Secondary | ICD-10-CM | POA: Diagnosis not present

## 2022-06-25 DIAGNOSIS — Z7901 Long term (current) use of anticoagulants: Secondary | ICD-10-CM | POA: Diagnosis not present

## 2022-06-25 DIAGNOSIS — K5792 Diverticulitis of intestine, part unspecified, without perforation or abscess without bleeding: Secondary | ICD-10-CM | POA: Diagnosis not present

## 2022-06-25 DIAGNOSIS — K219 Gastro-esophageal reflux disease without esophagitis: Secondary | ICD-10-CM

## 2022-06-25 LAB — PREPARE RBC (CROSSMATCH)

## 2022-06-25 LAB — GLUCOSE, CAPILLARY
Glucose-Capillary: 270 mg/dL — ABNORMAL HIGH (ref 70–99)
Glucose-Capillary: 179 mg/dL — ABNORMAL HIGH (ref 70–99)
Glucose-Capillary: 307 mg/dL — ABNORMAL HIGH (ref 70–99)
Glucose-Capillary: 354 mg/dL — ABNORMAL HIGH (ref 70–99)

## 2022-06-25 LAB — CBC
HCT: 23.1 % — ABNORMAL LOW (ref 36.0–46.0)
Hemoglobin: 7.2 g/dL — ABNORMAL LOW (ref 12.0–15.0)
MCH: 31 pg (ref 26.0–34.0)
MCHC: 31.2 g/dL (ref 30.0–36.0)
MCV: 99.6 fL (ref 80.0–100.0)
Platelets: 121 10*3/uL — ABNORMAL LOW (ref 150–400)
RBC: 2.32 MIL/uL — ABNORMAL LOW (ref 3.87–5.11)
RDW: 16.3 % — ABNORMAL HIGH (ref 11.5–15.5)
WBC: 5.3 10*3/uL (ref 4.0–10.5)
nRBC: 0 % (ref 0.0–0.2)

## 2022-06-25 LAB — URINE CULTURE: Culture: >=100000 — AB

## 2022-06-25 LAB — BASIC METABOLIC PANEL
Anion gap: 5 (ref 5–15)
BUN: 84 mg/dL — ABNORMAL HIGH (ref 8–23)
CO2: 19 mmol/L — ABNORMAL LOW (ref 22–32)
Calcium: 7.8 mg/dL — ABNORMAL LOW (ref 8.9–10.3)
Chloride: 112 mmol/L — ABNORMAL HIGH (ref 98–111)
Creatinine, Ser: 2.16 mg/dL — ABNORMAL HIGH (ref 0.44–1.00)
GFR, Estimated: 22 mL/min — ABNORMAL LOW (ref >60)
Glucose, Bld: 266 mg/dL — ABNORMAL HIGH (ref 70–99)
Potassium: 5.1 mmol/L (ref 3.5–5.1)
Sodium: 136 mmol/L (ref 135–145)

## 2022-06-25 LAB — PROTIME-INR
INR: 3.2 — ABNORMAL HIGH (ref 0.8–1.2)
Prothrombin Time: 32.2 seconds — ABNORMAL HIGH (ref 11.4–15.2)

## 2022-06-25 MED ORDER — SODIUM BICARBONATE 650 MG PO TABS: 650.0000 mg | ORAL_TABLET | Freq: Two times a day (BID) | ORAL | 2 refills | Status: DC

## 2022-06-25 MED ORDER — METRONIDAZOLE 500 MG PO TABS: 500.0000 mg | ORAL_TABLET | Freq: Two times a day (BID) | ORAL | 0 refills | Status: AC

## 2022-06-25 MED ORDER — SODIUM CHLORIDE 0.9 % IV SOLN
250.0000 mg | Freq: Once | INTRAVENOUS | Status: AC
  Administered 2022-06-25: 250 mg via INTRAVENOUS
  Filled 2022-06-25: qty 20

## 2022-06-25 MED ORDER — VANCOMYCIN HCL 125 MG PO CAPS: 125.0000 mg | ORAL_CAPSULE | Freq: Two times a day (BID) | ORAL | 0 refills | Status: AC

## 2022-06-25 MED ORDER — CIPROFLOXACIN HCL 500 MG PO TABS: 500.0000 mg | ORAL_TABLET | Freq: Two times a day (BID) | ORAL | 0 refills | Status: AC

## 2022-06-25 MED ORDER — SODIUM CHLORIDE 0.9% IV SOLUTION: Freq: Once | INTRAVENOUS | Status: AC

## 2022-06-25 NOTE — Plan of Care (Signed)
Discharge instructions reviewed with patient, questions answered, verbalized understanding.  Patient transported to main entrance via wheelchair to be taken home by son.

## 2022-06-25 NOTE — Progress Notes (Signed)
ANTICOAGULATION CONSULT NOTE - Follow Up Consult  Pharmacy Consult for Coumadin Indication:  afib, mech AVR  Allergies  Allergen Reactions   Flagyl [Metronidazole] Rash and Other (See Comments)    ALL-OVER BODY RASH   Augmentin [Amoxicillin-Pot Clavulanate] Diarrhea and Nausea And Vomiting   Ciprofloxacin Nausea Only and Other (See Comments)    Stomach ache   Coreg [Carvedilol] Other (See Comments)    Terrible cramping in the feet and had a lot of bowel movements, but not diarrhea   Diflucan [Fluconazole] Diarrhea   Losartan Swelling and Other (See Comments)    Patient doesn't recall site of swelling   Privigen [Immune Globulin (Human)] Other (See Comments)    Severe/excruciating pain   Sulfamethoxazole-Trimethoprim Nausea Only and Other (See Comments)    Stomach upset   Verapamil Hives   Zetia [Ezetimibe] Other (See Comments)    Reaction not recalled   Zocor [Simvastatin - High Dose] Other (See Comments)    Reaction not recalled   Gatifloxacin Rash and Other (See Comments)    Redness to skin around eye    Patient Measurements: Height: '4\' 11"'$  (149.9 cm) Weight: 64.4 kg (142 lb) IBW/kg (Calculated) : 43.2  Vital Signs: Temp: 97.9 F (36.6 C) (09/17 1438) Temp Source: Oral (09/17 1438) BP: 165/48 (09/17 1438) Pulse Rate: 77 (09/17 1438)  Labs: Recent Labs    06/23/22 0416 06/24/22 0424 06/25/22 0353  HGB 8.3* 8.2* 7.2*  HCT 26.2* 25.6* 23.1*  PLT 101* 124* 121*  LABPROT 26.4* 28.3* 32.2*  INR 2.5* 2.7* 3.2*  CREATININE 2.16* 2.23* 2.16*     Estimated Creatinine Clearance: 16.1 mL/min (A) (by C-G formula based on SCr of 2.16 mg/dL (H)).   Assessment:  AC/Heme: Warfarin PTA for PAF, mech AVR. INR 2.5. Hgb 10.3>8.3 - PTA Coum '5mg'$  daily except 7.'5mg'$  Sun/Fri, admit INR 2.3 (goal 2-3) - Iron 8 low, Sats 3 low, (autoimmune hemolytic anemia,)  Today, 06/25/22 Hgb 7.2, decreased  Plt 121 INR 3.2 Pt receiving transfusion today    Goal of Therapy:  INR  2-3 Monitor platelets by anticoagulation protocol: Yes   Plan:  Hold warfarin today  Daily INR    Royetta Asal, PharmD, BCPS 06/25/2022 3:20 PM

## 2022-06-25 NOTE — Discharge Summary (Signed)
Physician Discharge Summary  [name] LEX:[id number] DOB: 09-12-1941 DOA: 06/22/2022  PCP: [name], MD  Admit date: 06/22/2022 Discharge date: 06/25/2022  Admitted From: Home Disposition: Home  Recommendations for Outpatient Follow-up:  Follow up with PCP in 1-2 weeks Outpatient follow-up with gastroenterology 6-8 weeks following resolution of diverticulitis May benefit from establishing care with nephrology given CKD stage IV with metabolic acidosis. Continue Cipro and Flagyl on discharge complete antibiotic course for E. coli/Klebsiella pneumonia UTI and sigmoid diverticulitis Will continue on oral vancomycin for C. difficile prophylaxis while on antibiotics with history of C. Difficile Started on sodium bicarbonates for metabolic acidosis related to her CKD stage IV Please obtain BMP/CBC in one week Would recommend continue goals of care discussions outpatient with PCP given her multiple/recurrent hospitalizations.  Home Health: No needs identified by PT/OT during hospitalization Equipment/Devices: None  Discharge Condition: Stable CODE STATUS: DNR Diet recommendation: Heart healthy/consistent carbohydrate diet  History of present illness:  [name] is a 81 y.o. female with past medical history significant for type 2 diabetes mellitus, hemolytic anemia, HLD, CKD stage 4, vasculitis, paroxysmal atrial fibrillation on Coumadin, pulmonary artery hypertension, chronic diastolic congestive heart failure, history of diverticulitis, history of ESBL UTI, history of C. difficile colitis who presented to [facility] ED on 9/14 via EMS with a chief complaint of lower abdominal pain, nausea/diarrhea.  Patient reports she has had chronic diarrhea since diagnosis of C. difficile colitis in July 2023.  However, yesterday her episodes of diarrhea seem to worsen.  Also reports sharp pains in her lower abdomen, worse on the right.  Denies hematemesis.  Also notable burning with urination.   When her symptoms did not improve she decided to come to ED for further evaluation.   In the ED, temperature 101.8 F, HR 101, RR 26, BP 177/56, SPO2 96% on room air.  WBC 10.7, hemoglobin 10.3, platelets 150.  Sodium 135, potassium 4.7, chloride 109, CO2 17, BUN 76, creatinine 2.18.  Glucose 125.  AST 51, ALT 48, total bilirubin 0.7.  Lipase 66.  Lactic acid 1.3.  INR 2.3.  Urinalysis with large leukocytes, negative nitrite, many bacteria, greater than 50 WBCs.  CT abdomen/pelvis with extensive gas and debris within the bladder and some gas appearing to be within the bladder wall concerning for emphysematous cystitis, persistent/recurrent sigmoid diverticulitis with no evidence of abscess, new trace right pleural effusion.  Urine culture and blood cultures obtained.  Hospital course:  Sepsis, POA E. coli/Klebsiella pneumonia UTI Patient presenting with abdominal pain, increased urinary frequency/dysuria.  History of ESBL UTI.  Urinalysis consistent with UTI.  Patient with temperature 101.8 F, tachycardic, tachypneic, hypertensive on admission with elevated WBC count of 10.7.  Urine culture positive for E. coli and Klebsiella pneumonia.  Patient was initially started on meropenem will de-escalate to ciprofloxacin on discharge based on culture susceptibilities.   Recurrent diverticulitis CT abdomen/pelvis with noted persistent/recurrent sigmoid diverticulitis with no evidence of abscess on admission.  Patient was treated with meropenem and will discharge on Cipro/Flagyl to complete a 10-day course.  Recommend outpatient follow-up with gastroenterology 6-8 weeks.   Hx C. difficile colitis Continue vancomycin 125 mg PO BID for C. difficile prophylaxis while on antibiotics as above.  Anemia of chronic medical/renal disease Iron deficiency Patient was transfused 1 unit PRBCs and received dose of IV iron during hospitalization.  Recommend repeat CBC 1 week.   Type 2 diabetes mellitus Home regimen  includes Tresiba 9-14 units subcutaneously daily, NovoLog sliding scale.  Hemoglobin A1c 6.3 on 04/27/2022, well controlled.   Paroxysmal atrial fibrillation Continue metoprolol succinate 12.5 mg p.o. daily, Continue warfarin   Chronic diastolic congestive heart failure, compensated Continue metoprolol succinate 12.5 mg p.o. daily, Furosemide 40 mg p.o. twice daily, spironolactone 25 mg p.o. daily.   Essential hypertension Continue amlodipine 10 mg p.o. daily, hydralazine 100 mg p.o. 3 times daily, metoprolol succinate 12.5 mg p.o. daily, clonidine 0.2 mg p.o. twice daily, furosemide and spironolactone.  Continue aspirin and statin.   CKD stage IV Metabolic acidosis Baseline creatinine 2.0-2.4; creatinine 2.16 at time of discharge; stable.  Started on sodium bicarbonate 650 mg p.o. twice daily during hospitalization for metabolic acidosis.  Would benefit from establishing care with nephrology after discharge.   Hyperlipidemia Atorvastatin 80 mg p.o. daily   Anxiety: Alprazolam 0.25-0.5 mg p.o. nightly   Pulmonary artery hypertension Continue tadalafil 40 mg p.o. nightly, Prednisone 20 mg p.o. daily   GERD: Hold PPI with history of C. difficile colitis   Weakness/deconditioning/gait disturbance: Patient currently lives alone.  Appears to have poor social support; now with recurrent hospitalizations.  Seen by PT/OT with no needs identified.  Discharge Diagnoses:  Active Problems:   Diverticulitis   Hypertension   Hypercholesteremia   Chronic anticoagulation   GERD (gastroesophageal reflux disease)   Chronic diastolic CHF (congestive heart failure) (HCC)   Diarrhea   Diabetes mellitus type 2, controlled (HCC)   PAF (paroxysmal atrial fibrillation) (HCC)   Vasculitis, ANCA positive   CKD (chronic kidney disease), stage IV (HCC)   PAH (pulmonary artery hypertension) (HCC)   UTI (urinary tract infection)   Pressure injury of skin    Discharge Instructions  Discharge  Instructions     Call MD for:  difficulty breathing, headache or visual disturbances   Complete by: As directed    Call MD for:  extreme fatigue   Complete by: As directed    Call MD for:  persistant dizziness or light-headedness   Complete by: As directed    Call MD for:  persistant nausea and vomiting   Complete by: As directed    Call MD for:  severe uncontrolled pain   Complete by: As directed    Call MD for:  temperature >100.4   Complete by: As directed    Diet - low sodium heart healthy   Complete by: As directed    Increase activity slowly   Complete by: As directed    No wound care   Complete by: As directed       Allergies as of 06/25/2022       Reactions   Flagyl [metronidazole] Rash, Other (See Comments)   ALL-OVER BODY RASH   Augmentin [amoxicillin-pot Clavulanate] Diarrhea, Nausea And Vomiting   Ciprofloxacin Nausea Only, Other (See Comments)   Stomach ache   Coreg [carvedilol] Other (See Comments)   Terrible cramping in the feet and had a lot of bowel movements, but not diarrhea   Diflucan [fluconazole] Diarrhea   Losartan Swelling, Other (See Comments)   Patient doesn't recall site of swelling   Privigen [immune Globulin (human)] Other (See Comments)   Severe/excruciating pain   Sulfamethoxazole-trimethoprim Nausea Only, Other (See Comments)   Stomach upset   Verapamil Hives   Zetia [ezetimibe] Other (See Comments)   Reaction not recalled   Zocor [simvastatin - High Dose] Other (See Comments)   Reaction not recalled   Gatifloxacin Rash, Other (See Comments)   Redness to skin around eye  Medication List     TAKE these medications    acetaminophen 325 MG tablet Commonly known as: TYLENOL Take 650 mg by mouth every 6 (six) hours as needed for mild pain or fever.   alendronate 70 MG tablet Commonly known as: FOSAMAX Take 70 mg by mouth every Saturday.   allopurinol 100 MG tablet Commonly known as: ZYLOPRIM Take 100 mg by mouth  daily.   ALPRAZolam 0.5 MG tablet Commonly known as: XANAX Take 0.25-0.5 mg by mouth at bedtime.   amLODipine 10 MG tablet Commonly known as: NORVASC TAKE 1 TABLET EVERY DAY   ascorbic acid 500 MG tablet Commonly known as: VITAMIN C Take 500 mg by mouth daily with lunch.   aspirin EC 81 MG tablet Take 81 mg by mouth daily. Swallow whole.   atorvastatin 80 MG tablet Commonly known as: LIPITOR Take 1 tablet (80 mg total) by mouth daily at 6 PM.   B-D INS SYR ULTRAFINE .3CC/29G 29G X 1/2" 0.3 ML Misc Generic drug: Insulin Syringe-Needle U-100 Use for sliding scale for breakthrough high blood sugar readings   cholestyramine 4 GM/DOSE powder Commonly known as: QUESTRAN Take 4 g by mouth daily after supper.   ciprofloxacin 500 MG tablet Commonly known as: Cipro Take 1 tablet (500 mg total) by mouth 2 (two) times daily for 8 days.   cloNIDine 0.2 MG tablet Commonly known as: CATAPRES TAKE 1 TABLET TWICE DAILY   estradiol 0.1 MG/GM vaginal cream Commonly known as: ESTRACE Place 0.5g nightly for two weeks then twice a week after What changed:  how much to take how to take this when to take this additional instructions   famotidine 20 MG tablet Commonly known as: PEPCID Take 1 tablet (20 mg total) by mouth 2 (two) times daily.   Florastor 250 MG capsule Generic drug: saccharomyces boulardii Take 250 mg by mouth 2 (two) times daily.   folic acid 1 MG tablet Commonly known as: FOLVITE Take 1 mg by mouth daily.   furosemide 40 MG tablet Commonly known as: LASIX TAKE 2 TABLETS (80 MG TOTAL) IN MORNING AND 1 AND 1/2 TABLETS (60 MG TOTAL) IN EVENING. CHANGE IN DOSAGE. What changed: See the new instructions.   hydrALAZINE 100 MG tablet Commonly known as: APRESOLINE TAKE 1 TABLET THREE TIMES DAILY   insulin aspart 100 UNIT/ML injection Commonly known as: novoLOG Take 2 units for blood sugar 200-250, 4 units for blood sugar 252-300, 6 units for blood sugar 301-350, 8  units for blood sugar greater than 350 and call MD. What changed:  how much to take how to take this when to take this additional instructions   lactose free nutrition Liqd Take 237 mLs by mouth daily with supper. (To supplement meals)   lidocaine 2 % solution Commonly known as: XYLOCAINE Use as directed 15 mLs in the mouth or throat as needed for mouth pain. What changed: when to take this   metoprolol succinate 25 MG 24 hr tablet Commonly known as: TOPROL-XL TAKE 1/2 TABLET EVERY DAY   metroNIDAZOLE 500 MG tablet Commonly known as: Flagyl Take 1 tablet (500 mg total) by mouth 2 (two) times daily for 8 days.   mirabegron ER 25 MG Tb24 tablet Commonly known as: Myrbetriq Take 1 tablet (25 mg total) by mouth daily.   nystatin-alum & mag hydroxide-simeth-diphenhydrAMINE Swish 5 mLs by mouth 3 times daily then spit   ondansetron 4 MG disintegrating tablet Commonly known as: ZOFRAN-ODT Take 1 tablet (4 mg total) by  mouth every 8 (eight) hours as needed for vomiting or nausea.   predniSONE 10 MG tablet Commonly known as: DELTASONE Take 2 tablets (20 mg total) by mouth daily with breakfast.   simethicone 80 MG chewable tablet Commonly known as: Gas-X Chew 1 tablet (80 mg total) by mouth 4 (four) times daily as needed for flatulence.   sodium bicarbonate 650 MG tablet Take 1 tablet (650 mg total) by mouth 2 (two) times daily.   spironolactone 25 MG tablet Commonly known as: ALDACTONE TAKE 1 TABLET EVERY DAY   tadalafil (PAH) 20 MG tablet Commonly known as: ADCIRCA Take 2 tablets (40 mg total) by mouth daily. What changed: when to take this   [name] FlexTouch 100 UNIT/ML FlexTouch Pen Generic drug: insulin degludec Inject 9-14 Units into the skin at bedtime.   True Metrix Blood Glucose Test test strip Generic drug: glucose blood Use 1-2 times daily   True Metrix Blood Glucose Test test strip Generic drug: glucose blood SMARTSIG:Via Meter   True Metrix Meter  w/Device Kit   vancomycin 125 MG capsule Commonly known as: VANCOCIN Take 1 capsule (125 mg total) by mouth 2 (two) times daily for 8 days.   Vitamin B12 1000 MCG Tbcr Take 1,000 mcg by mouth daily with lunch.   Vitamin D3 50 MCG (2000 UT) capsule Take 2,000 Units by mouth daily with lunch.   warfarin 5 MG tablet Commonly known as: COUMADIN Take as directed. If you are unsure how to take this medication, talk to your nurse or doctor. Original instructions: TAKE 1 TO 1 AND 1/2 TABLETS DAILY AS DIRECTED BY COUMADIN CLINIC What changed:  how much to take how to take this when to take this additional instructions        Follow-up Information     [name], [name], MD. Schedule an appointment as soon as possible for a visit in 1 week(s).   Specialty: Internal Medicine Contact information: [place] [address] [phone]                Allergies  Allergen Reactions   Flagyl [Metronidazole] Rash and Other (See Comments)    ALL-OVER BODY RASH   Augmentin [Amoxicillin-Pot Clavulanate] Diarrhea and Nausea And Vomiting   Ciprofloxacin Nausea Only and Other (See Comments)    Stomach ache   Coreg [Carvedilol] Other (See Comments)    Terrible cramping in the feet and had a lot of bowel movements, but not diarrhea   Diflucan [Fluconazole] Diarrhea   Losartan Swelling and Other (See Comments)    Patient doesn't recall site of swelling   Privigen [Immune Globulin (Human)] Other (See Comments)    Severe/excruciating pain   Sulfamethoxazole-Trimethoprim Nausea Only and Other (See Comments)    Stomach upset   Verapamil Hives   Zetia [Ezetimibe] Other (See Comments)    Reaction not recalled   Zocor [Simvastatin - High Dose] Other (See Comments)    Reaction not recalled   Gatifloxacin Rash and Other (See Comments)    Redness to skin around eye    Consultations: None  Procedures/Studies: CT ABDOMEN PELVIS WO CONTRAST  Result Date:  06/22/2022 CLINICAL DATA:  Left lower quadrant abdominal pain and nausea. Sepsis. EXAM: CT ABDOMEN AND PELVIS WITHOUT CONTRAST TECHNIQUE: Multidetector CT imaging of the abdomen and pelvis was performed following the standard protocol without IV contrast. RADIATION DOSE REDUCTION: This exam was performed according to the departmental dose-optimization program which includes automated exposure control, adjustment of the mA and/or kV according  to patient size and/or use of iterative reconstruction technique. COMPARISON:  CT abdomen and pelvis 04/18/2022 FINDINGS: Lower chest: New trace right pleural effusion. Atelectasis in both lower lobes. Large calcified granuloma in the left lower lobe. Coronary atherosclerosis. Hepatobiliary: Scattered calcified granulomas in the liver. Cholecystectomy. No biliary dilatation. Pancreas: Unremarkable. Spleen: Calcified granuloma. Adrenals/Urinary Tract: Unremarkable adrenal glands. Renal vascular calcifications. No evidence of a renal mass, hydronephrosis, or definite urinary tract calculi. Extensive gas and debris in the bladder with some of the gas appearing to be within the bladder wall. Stomach/Bowel: The stomach is unremarkable. A duodenal diverticulum is again noted. There is no evidence of bowel obstruction. Diverticulosis is again noted of the sigmoid colon with persistent wall thickening and surrounding inflammatory changes which are greatest near the proximal sigmoid colon. The inflammation more localized to the proximal sigmoid colon is stable to mildly increased compared to the prior study, however the degree of overall inflammation throughout the pelvis is less than on the prior CT. Vascular/Lymphatic: Extensive abdominal aortic atherosclerosis without aneurysm. No enlarged lymph nodes. Reproductive: Status post hysterectomy.  No gross adnexal mass. Other: Minimal pelvic free fluid. No organized fluid collection or pneumoperitoneum. Nonspecific subcutaneous  stranding/edema in the lower abdominal wall and proximal thighs, right greater than left. Musculoskeletal: No suspicious osseous lesion. Asymmetrically advanced right hip osteoarthrosis. Chronic L4 compression fracture. IMPRESSION: 1. Extensive gas and debris in the bladder with some gas appearing to be within the bladder wall, concerning for emphysematous cystitis. A fistula between the bladder and either bowel or vagina is not excluded given this appearance, however a discrete fistula is not readily apparent on today's CT, and a colovesical fistula was not shown on a pelvic CT utilizing rectal contrast in July. 2. Persistent/recurrent sigmoid diverticulitis. No evidence of abscess. 3. New trace right pleural effusion. 4. Aortic Atherosclerosis (ICD10-I70.0) and Emphysema (ICD10-J43.9). Electronically Signed   By: Logan Bores M.D.   On: 06/22/2022 11:32   DG Chest Port 1 View  Result Date: 06/22/2022 CLINICAL DATA:  Sepsis. EXAM: PORTABLE CHEST 1 VIEW COMPARISON:  April 18, 2022. FINDINGS: Stable cardiomediastinal silhouette. Status post aortic valve replacement. Increased bibasilar atelectasis or edema is noted with small pleural effusions. Bony thorax is unremarkable. IMPRESSION: Increased bibasilar atelectasis or edema is noted with small pleural effusions. Electronically Signed   By: Marijo Conception M.D.   On: 06/22/2022 11:15   DG Abd 1 View  Result Date: 06/06/2022 CLINICAL DATA:  Diarrhea. EXAM: ABDOMEN - 1 VIEW COMPARISON:  Abdominal x-ray 02/04/2022. FINDINGS: There is gaseous distention of the colon to the level the rectum. There is no evidence for bowel obstruction. The stomach is moderately distended. There surgical clips in the right upper quadrant. Prominent vascular calcifications are present. Left iliac artery stent is present. There is mild curvature of the lumbar spine as seen on the prior study. Degenerative changes affect the spine and hips. IMPRESSION: Nonobstructive, nonspecific bowel  gas pattern. Electronically Signed   By: Ronney Asters M.D.   On: 06/06/2022 23:02     Subjective: Patient seen examined bedside, resting comfortably.  No specific complaints this morning.  Wishes to discharge home this afternoon.  No other questions or concerns at this time.  Denies headache, no dizziness, no chest pain, no palpitations, no shortness of breath, no abdominal pain, no cough/congestion, no fever/chills/night sweats, no nausea/vomiting/diarrhea, no focal weakness, no fatigue, no paresthesias.  No acute events overnight per nursing staff.  Discharge Exam: Vitals:   06/25/22 0541 06/25/22  1139  BP: (!) 146/72 (!) 143/65  Pulse: 70 80  Resp:  16  Temp: 97.9 F (36.6 C) 98.3 F (36.8 C)  SpO2: 98% 97%   Vitals:   06/24/22 1315 06/24/22 2155 06/25/22 0541 06/25/22 1139  BP: (!) 150/52 (!) 134/40 (!) 146/72 (!) 143/65  Pulse: 88 82 70 80  Resp: (!) _0 Temp: 98.1 F (36.7 C) 98.1 F (36.7 C) 97.9 F (36.6 C) 98.3 F (36.8 C)  TempSrc: Oral Oral Oral Oral  SpO2: 97% 98% 98% 97%  Weight:      Height:        Physical Exam: GEN: NAD, alert and oriented x 3, chronically ill in appearance HEENT: NCAT, PERRL, EOMI, sclera clear, MMM PULM: CTAB w/o wheezes/crackles, normal respiratory effort, on room air CV: RRR w/o M/G/R GI: abd soft, NTND, NABS, no R/G/M MSK: 1+ pitting peripheral edema bilateral lower extremities, muscle strength globally intact 5/5 bilateral upper/lower extremities NEURO: CN II-XII intact, no focal deficits, sensation to light touch intact PSYCH: normal mood/affect Integumentary: Multiple areas of ecchymosis in various stages of healing, otherwise no other concerning rashes/lesions/wounds noted on exposed skin surfaces.      The results of significant diagnostics from this hospitalization (including imaging, microbiology, ancillary and laboratory) are listed below for reference.     Microbiology: Recent Results (from the past 240 hour(s))   Urine Culture     Status: Abnormal   Collection Time: 06/22/22 10:30 AM   Specimen: In/Out Cath Urine  Result Value Ref Range Status   Specimen Description   Final    IN/OUT CATH URINE Performed at [facility], [place] [address], [place], [place] [address]    Special Requests   Final    NONE Performed at [facility], [place] [address], [place], [place] [address]    Culture (A)  Final    >=100,000 COLONIES/mL ESCHERICHIA COLI 30,000 COLONIES/mL KLEBSIELLA PNEUMONIAE Confirmed Extended Spectrum Beta-Lactamase Producer (ESBL).  In bloodstream infections from ESBL organisms, carbapenems are preferred over piperacillin/tazobactam. They are shown to have a lower risk of mortality.    Report Status 06/25/2022 FINAL  Final   Organism ID, Bacteria ESCHERICHIA COLI (A)  Final   Organism ID, Bacteria KLEBSIELLA PNEUMONIAE (A)  Final      Susceptibility   Escherichia coli - MIC*    AMPICILLIN <=2 SENSITIVE Sensitive     CEFAZOLIN <=4 SENSITIVE Sensitive     CEFEPIME <=0.12 SENSITIVE Sensitive     CEFTRIAXONE <=0.25 SENSITIVE Sensitive     CIPROFLOXACIN <=0.25 SENSITIVE Sensitive     GENTAMICIN <=1 SENSITIVE Sensitive     IMIPENEM <=0.25 SENSITIVE Sensitive     NITROFURANTOIN <=16 SENSITIVE Sensitive     TRIMETH/SULFA <=20 SENSITIVE Sensitive     AMPICILLIN/SULBACTAM <=2 SENSITIVE Sensitive     PIP/TAZO <=4 SENSITIVE Sensitive     * >=100,000 COLONIES/mL ESCHERICHIA COLI   Klebsiella pneumoniae - MIC*    AMPICILLIN >=32 RESISTANT Resistant     CEFAZOLIN >=64 RESISTANT Resistant     CEFEPIME >=32 RESISTANT Resistant     CEFTRIAXONE 32 RESISTANT Resistant     CIPROFLOXACIN <=0.25 SENSITIVE Sensitive     GENTAMICIN >=16 RESISTANT Resistant     IMIPENEM <=0.25 SENSITIVE Sensitive     NITROFURANTOIN <=16 SENSITIVE Sensitive     TRIMETH/SULFA >=320 RESISTANT Resistant     AMPICILLIN/SULBACTAM 16 INTERMEDIATE Intermediate     PIP/TAZO <=4 SENSITIVE  Sensitive     *  30,000 COLONIES/mL KLEBSIELLA PNEUMONIAE  Blood Culture (routine x 2)     Status: None (Preliminary result)   Collection Time: 06/22/22 10:40 AM   Specimen: BLOOD  Result Value Ref Range Status   Specimen Description   Final    BLOOD RIGHT ANTECUBITAL Performed at [facility] [address] [place], [place] [address]    Special Requests   Final    BOTTLES DRAWN AEROBIC AND ANAEROBIC Blood Culture adequate volume Performed at [facility] [address] [place], [place] [address]    Culture   Final    NO GROWTH 3 DAYS Performed at [facility], [place] [address] [place], [place] [address]    Report Status PENDING  Incomplete  Blood Culture (routine x 2)     Status: None (Preliminary result)   Collection Time: 06/22/22 10:45 AM   Specimen: BLOOD LEFT HAND  Result Value Ref Range Status   Specimen Description   Final    BLOOD LEFT HAND Performed at [facility] [address], [place], [place] [address]    Special Requests   Final    BOTTLES DRAWN AEROBIC ONLY Blood Culture adequate volume Performed at [facility] [address], [place], [place] [address]    Culture   Final    NO GROWTH 3 DAYS Performed at [facility], [place] [address] [place], [place] [address]    Report Status PENDING  Incomplete     Labs: BNP (last 3 results) Recent Labs    02/02/22 1030 02/04/22 1730 04/12/22 1716  BNP 133.8* 253.4* 15.1   Basic Metabolic Panel: Recent Labs  Lab 06/22/22 0955 06/23/22 0416 06/24/22 0424 06/25/22 0353  NA 135 135 138 136  K 4.7 5.2* 5.0 5.1  CL 109 110 113* 112*  CO2 17* 19* 18* 19*  GLUCOSE 125* 230* 259* 266*  BUN 76* 78* 80* 84*  CREATININE 2.18* 2.16* 2.23* 2.16*  CALCIUM 8.7* 8.0* 8.2* 7.8*   Liver Function Tests: Recent Labs  Lab 06/22/22 0955 06/23/22 0416  AST 51* 39  ALT 48* 39  ALKPHOS 99 78  BILITOT 0.7 0.7  PROT 6.0* 4.8*   ALBUMIN 3.4* 2.6*   Recent Labs  Lab 06/22/22 0955  LIPASE 66*   No results for input(s): "AMMONIA" in the last 168 hours. CBC: Recent Labs  Lab 06/22/22 0955 06/23/22 0416 06/24/22 0424 06/25/22 0353  WBC 10.7* 9.3 9.7 5.3  NEUTROABS 8.7*  --   --   --   HGB 10.3* 8.3* 8.2* 7.2*  HCT 31.5* 26.2* 25.6* 23.1*  MCV 96.9 99.2 99.2 99.6  PLT 150 101* 124* 121*   Cardiac Enzymes: No results for input(s): "CKTOTAL", "CKMB", "CKMBINDEX", "TROPONINI" in the last 168 hours. BNP: Invalid input(s): "POCBNP" CBG: Recent Labs  Lab 06/24/22 1642 06/24/22 2139 06/25/22 0138 06/25/22 0811 06/25/22 1136  GLUCAP 265* 298* 270* 179* 307*   D-Dimer No results for input(s): "DDIMER" in the last 72 hours. Hgb A1c No results for input(s): "HGBA1C" in the last 72 hours. Lipid Profile No results for input(s): "CHOL", "HDL", "LDLCALC", "TRIG", "CHOLHDL", "LDLDIRECT" in the last 72 hours. Thyroid function studies No results for input(s): "TSH", "T4TOTAL", "T3FREE", "THYROIDAB" in the last 72 hours.  Invalid input(s): "FREET3" Anemia work up Recent Labs    06/22/22 1530  TIBC 267  IRON 8*   Urinalysis    Component Value Date/Time   COLORURINE YELLOW 06/22/2022 1030   APPEARANCEUR  TURBID (A) 06/22/2022 1030   LABSPEC 1.010 06/22/2022 1030   PHURINE 5.0 06/22/2022 1030   GLUCOSEU NEGATIVE 06/22/2022 1030   HGBUR LARGE (A) 06/22/2022 1030   BILIRUBINUR NEGATIVE 06/22/2022 1030   BILIRUBINUR Negative 09/07/2021 1054   KETONESUR NEGATIVE 06/22/2022 1030   PROTEINUR 100 (A) 06/22/2022 1030   UROBILINOGEN 0.2 09/07/2021 1054   UROBILINOGEN 1.0 10/06/2011 1709   NITRITE NEGATIVE 06/22/2022 1030   LEUKOCYTESUR LARGE (A) 06/22/2022 1030   Sepsis Labs Recent Labs  Lab 06/22/22 0955 06/23/22 0416 06/24/22 0424 06/25/22 0353  WBC 10.7* 9.3 9.7 5.3   Microbiology Recent Results (from the past 240 hour(s))  Urine Culture     Status: Abnormal   Collection Time: 06/22/22 10:30  AM   Specimen: In/Out Cath Urine  Result Value Ref Range Status   Specimen Description   Final    IN/OUT CATH URINE Performed at [facility], [place] [address], [place], [place] [address]    Special Requests   Final    NONE Performed at [facility], [place] [address], [place], [place] [address]    Culture (A)  Final    >=100,000 COLONIES/mL ESCHERICHIA COLI 30,000 COLONIES/mL KLEBSIELLA PNEUMONIAE Confirmed Extended Spectrum Beta-Lactamase Producer (ESBL).  In bloodstream infections from ESBL organisms, carbapenems are preferred over piperacillin/tazobactam. They are shown to have a lower risk of mortality.    Report Status 06/25/2022 FINAL  Final   Organism ID, Bacteria ESCHERICHIA COLI (A)  Final   Organism ID, Bacteria KLEBSIELLA PNEUMONIAE (A)  Final      Susceptibility   Escherichia coli - MIC*    AMPICILLIN <=2 SENSITIVE Sensitive     CEFAZOLIN <=4 SENSITIVE Sensitive     CEFEPIME <=0.12 SENSITIVE Sensitive     CEFTRIAXONE <=0.25 SENSITIVE Sensitive     CIPROFLOXACIN <=0.25 SENSITIVE Sensitive     GENTAMICIN <=1 SENSITIVE Sensitive     IMIPENEM <=0.25 SENSITIVE Sensitive     NITROFURANTOIN <=16 SENSITIVE Sensitive     TRIMETH/SULFA <=20 SENSITIVE Sensitive     AMPICILLIN/SULBACTAM <=2 SENSITIVE Sensitive     PIP/TAZO <=4 SENSITIVE Sensitive     * >=100,000 COLONIES/mL ESCHERICHIA COLI   Klebsiella pneumoniae - MIC*    AMPICILLIN >=32 RESISTANT Resistant     CEFAZOLIN >=64 RESISTANT Resistant     CEFEPIME >=32 RESISTANT Resistant     CEFTRIAXONE 32 RESISTANT Resistant     CIPROFLOXACIN <=0.25 SENSITIVE Sensitive     GENTAMICIN >=16 RESISTANT Resistant     IMIPENEM <=0.25 SENSITIVE Sensitive     NITROFURANTOIN <=16 SENSITIVE Sensitive     TRIMETH/SULFA >=320 RESISTANT Resistant     AMPICILLIN/SULBACTAM 16 INTERMEDIATE Intermediate     PIP/TAZO <=4 SENSITIVE Sensitive     * 30,000 COLONIES/mL KLEBSIELLA PNEUMONIAE  Blood Culture  (routine x 2)     Status: None (Preliminary result)   Collection Time: 06/22/22 10:40 AM   Specimen: BLOOD  Result Value Ref Range Status   Specimen Description   Final    BLOOD RIGHT ANTECUBITAL Performed at [facility], [place] [address] [place], [place] [address]    Special Requests   Final    BOTTLES DRAWN AEROBIC AND ANAEROBIC Blood Culture adequate volume Performed at [place] [address], [place], [place] [address]    Culture   Final    NO GROWTH 3 DAYS Performed at [facility], [place] [address], [place], [place] [address]    Report Status PENDING  Incomplete  Blood Culture (routine x 2)     Status: None (Preliminary result)   Collection Time: 06/22/22 10:45 AM   Specimen: BLOOD LEFT HAND  Result Value Ref Range Status   Specimen Description   Final    BLOOD LEFT HAND Performed at [place] [address] [place], [place] [address]    Special Requests   Final    BOTTLES DRAWN AEROBIC ONLY Blood Culture adequate volume Performed at [place] [address] [place], [place] [address]    Culture   Final    NO GROWTH 3 DAYS Performed at [facility], [place] [address], [place], [place] [address]    Report Status PENDING  Incomplete     Time coordinating discharge: Over 30 minutes  SIGNED:   Unika Nazareno J [place] DO  [facility] 06/25/2022, 1:02 PM

## 2022-06-26 ENCOUNTER — Ambulatory Visit (INDEPENDENT_AMBULATORY_CARE_PROVIDER_SITE_OTHER): Payer: Medicare HMO

## 2022-06-26 DIAGNOSIS — Z952 Presence of prosthetic heart valve: Secondary | ICD-10-CM | POA: Diagnosis not present

## 2022-06-26 DIAGNOSIS — Z7901 Long term (current) use of anticoagulants: Secondary | ICD-10-CM

## 2022-06-26 LAB — BPAM RBC
Blood Product Expiration Date: 202310082359
ISSUE DATE / TIME: 202309171348
Unit Type and Rh: 6200

## 2022-06-26 LAB — TYPE AND SCREEN
ABO/RH(D): A POS
Antibody Screen: NEGATIVE
Unit division: 0

## 2022-06-26 NOTE — Patient Instructions (Signed)
Description   Spoke with pt's. Instructed pt to only take 0.5 tablet today, tomorrow, and Wednesday and eat greens since she is on antibiotics.  Normal dose: 1 tablet daily except 1.5 tablets on Sundays and Fridays.  [facility], show up in Epic  (normally done weekly).  Call with Prednisone doseage changes ('20mg'$  daily). Please keep green leafy veggies and boost in your diet. Call with any new meds [phone] & if you have any procedures where you need to hold warfarin, have them fax a clearance form or labs to [phone].

## 2022-06-27 LAB — CULTURE, BLOOD (ROUTINE X 2)
Culture: NO GROWTH
Culture: NO GROWTH
Special Requests: ADEQUATE
Special Requests: ADEQUATE

## 2022-06-29 ENCOUNTER — Ambulatory Visit (INDEPENDENT_AMBULATORY_CARE_PROVIDER_SITE_OTHER): Payer: Medicare HMO

## 2022-06-29 ENCOUNTER — Inpatient Hospital Stay: Payer: Medicare HMO

## 2022-06-29 ENCOUNTER — Ambulatory Visit (HOSPITAL_COMMUNITY)
Admission: RE | Admit: 2022-06-29 | Discharge: 2022-06-29 | Disposition: A | Discharge status: Home / Self Care | Payer: Medicare HMO | Source: Ambulatory Visit | Attending: Adult Health | Admitting: Adult Health

## 2022-06-29 ENCOUNTER — Inpatient Hospital Stay (HOSPITAL_BASED_OUTPATIENT_CLINIC_OR_DEPARTMENT_OTHER): Payer: Medicare HMO | Admitting: Adult Health

## 2022-06-29 ENCOUNTER — Other Ambulatory Visit: Payer: Self-pay

## 2022-06-29 ENCOUNTER — Telehealth: Payer: Self-pay | Admitting: *Deleted

## 2022-06-29 VITALS — BP 150/50 | HR 64 | Temp 97.9°F | Resp 18 | Ht 59.0 in | Wt 148.8 lb

## 2022-06-29 DIAGNOSIS — Z7901 Long term (current) use of anticoagulants: Secondary | ICD-10-CM

## 2022-06-29 DIAGNOSIS — R1013 Epigastric pain: Secondary | ICD-10-CM | POA: Insufficient documentation

## 2022-06-29 DIAGNOSIS — Z952 Presence of prosthetic heart valve: Secondary | ICD-10-CM

## 2022-06-29 DIAGNOSIS — D591 Autoimmune hemolytic anemia, unspecified: Secondary | ICD-10-CM

## 2022-06-29 DIAGNOSIS — K5732 Diverticulitis of large intestine without perforation or abscess without bleeding: Secondary | ICD-10-CM | POA: Diagnosis not present

## 2022-06-29 DIAGNOSIS — I7782 Antineutrophilic cytoplasmic antibody (ANCA) vasculitis: Secondary | ICD-10-CM | POA: Diagnosis not present

## 2022-06-29 DIAGNOSIS — D594 Other nonautoimmune hemolytic anemias: Secondary | ICD-10-CM | POA: Diagnosis not present

## 2022-06-29 DIAGNOSIS — I480 Paroxysmal atrial fibrillation: Secondary | ICD-10-CM | POA: Diagnosis not present

## 2022-06-29 DIAGNOSIS — N189 Chronic kidney disease, unspecified: Secondary | ICD-10-CM | POA: Diagnosis not present

## 2022-06-29 DIAGNOSIS — M7989 Other specified soft tissue disorders: Secondary | ICD-10-CM | POA: Diagnosis not present

## 2022-06-29 DIAGNOSIS — M8580 Other specified disorders of bone density and structure, unspecified site: Secondary | ICD-10-CM | POA: Diagnosis not present

## 2022-06-29 LAB — RETIC PANEL
Immature Retic Fract: 10 % (ref 2.3–15.9)
RBC.: 3.07 MIL/uL — ABNORMAL LOW (ref 3.87–5.11)
Retic Count, Absolute: 51.9 10*3/uL (ref 19.0–186.0)
Retic Ct Pct: 1.7 % (ref 0.4–3.1)
Reticulocyte Hemoglobin: 33.9 pg (ref >27.9)

## 2022-06-29 LAB — CMP (CANCER CENTER ONLY)
ALT: 20 U/L (ref 0–44)
AST: 30 U/L (ref 15–41)
Albumin: 3.2 g/dL — ABNORMAL LOW (ref 3.5–5.0)
Alkaline Phosphatase: 78 U/L (ref 38–126)
Anion gap: 7 (ref 5–15)
BUN: 70 mg/dL — ABNORMAL HIGH (ref 8–23)
CO2: 21 mmol/L — ABNORMAL LOW (ref 22–32)
Calcium: 8.1 mg/dL — ABNORMAL LOW (ref 8.9–10.3)
Chloride: 104 mmol/L (ref 98–111)
Creatinine: 2.33 mg/dL — ABNORMAL HIGH (ref 0.44–1.00)
GFR, Estimated: 20 mL/min — ABNORMAL LOW (ref >60)
Glucose, Bld: 210 mg/dL — ABNORMAL HIGH (ref 70–99)
Potassium: 5.3 mmol/L — ABNORMAL HIGH (ref 3.5–5.1)
Sodium: 132 mmol/L — ABNORMAL LOW (ref 135–145)
Total Bilirubin: 0.6 mg/dL (ref 0.3–1.2)
Total Protein: 5.1 g/dL — ABNORMAL LOW (ref 6.5–8.1)

## 2022-06-29 LAB — CBC WITH DIFFERENTIAL/PLATELET
Abs Immature Granulocytes: 0.03 10*3/uL (ref 0.00–0.07)
Basophils Absolute: 0 10*3/uL (ref 0.0–0.1)
Basophils Relative: 0 %
Eosinophils Absolute: 0 10*3/uL (ref 0.0–0.5)
Eosinophils Relative: 0 %
HCT: 28.4 % — ABNORMAL LOW (ref 36.0–46.0)
Hemoglobin: 9.6 g/dL — ABNORMAL LOW (ref 12.0–15.0)
Immature Granulocytes: 1 %
Lymphocytes Relative: 7 %
Lymphs Abs: 0.4 10*3/uL — ABNORMAL LOW (ref 0.7–4.0)
MCH: 30.9 pg (ref 26.0–34.0)
MCHC: 33.8 g/dL (ref 30.0–36.0)
MCV: 91.3 fL (ref 80.0–100.0)
Monocytes Absolute: 0.1 10*3/uL (ref 0.1–1.0)
Monocytes Relative: 2 %
Neutro Abs: 5 10*3/uL (ref 1.7–7.7)
Neutrophils Relative %: 90 %
Platelets: 133 10*3/uL — ABNORMAL LOW (ref 150–400)
RBC: 3.11 MIL/uL — ABNORMAL LOW (ref 3.87–5.11)
RDW: 16.3 % — ABNORMAL HIGH (ref 11.5–15.5)
WBC: 5.5 10*3/uL (ref 4.0–10.5)
nRBC: 0 % (ref 0.0–0.2)

## 2022-06-29 LAB — SAMPLE TO BLOOD BANK

## 2022-06-29 LAB — PROTIME-INR
INR: 1.8 — ABNORMAL HIGH (ref 0.8–1.2)
Prothrombin Time: 21 seconds — ABNORMAL HIGH (ref 11.4–15.2)

## 2022-06-29 NOTE — Telephone Encounter (Signed)
Per Marline Backbone, called pt with message below. Pt requested x-ray report be faxed to [place]. Report was routed to [facility] office and labs routed to [place] PCP. Pt verbalized understanding

## 2022-06-29 NOTE — Telephone Encounter (Signed)
-----   Message from Gardenia Phlegm, NP sent at 06/29/2022  3:44 PM EDT ----- Please let patient know that the x-ray does not show any emergent concern such as an obstruction.  I would recommend for her swelling that she follow-up with Dr. Algernon Huxley who prescribes her Lasix.  Please also fax labs to Dr. Osborne Casco her primary care provider ----- Message ----- From: Interface, Rad Results In Sent: 06/29/2022   1:49 PM EDT To: Gardenia Phlegm, NP

## 2022-06-29 NOTE — Patient Instructions (Signed)
Description   Spoke with pt's. Instructed to take 1 tablet today, 1 tablet tomorrow, 1 tablet Saturday, and 0.5 tablet on Sunday and then resume taking 1 tablet daily except 1.5 tablets on Sundays and Fridays.  Recheck INR on 07/07/22 at [facility], show up in Epic  (normally done weekly).  Call with Prednisone doseage changes ('20mg'$  daily). Please keep green leafy veggies and boost in your diet. Call with any new meds [phone] & if you have any procedures where you need to hold warfarin, have them fax a clearance form or labs to [phone].

## 2022-06-29 NOTE — Telephone Encounter (Signed)
Pt labs routed to [place]

## 2022-06-30 DIAGNOSIS — I7782 Antineutrophilic cytoplasmic antibody (ANCA) vasculitis: Secondary | ICD-10-CM | POA: Diagnosis not present

## 2022-06-30 DIAGNOSIS — I5030 Unspecified diastolic (congestive) heart failure: Secondary | ICD-10-CM | POA: Diagnosis not present

## 2022-06-30 DIAGNOSIS — N189 Chronic kidney disease, unspecified: Secondary | ICD-10-CM | POA: Diagnosis not present

## 2022-06-30 DIAGNOSIS — D696 Thrombocytopenia, unspecified: Secondary | ICD-10-CM | POA: Diagnosis not present

## 2022-06-30 DIAGNOSIS — D649 Anemia, unspecified: Secondary | ICD-10-CM | POA: Diagnosis not present

## 2022-06-30 DIAGNOSIS — I130 Hypertensive heart and chronic kidney disease with heart failure and stage 1 through stage 4 chronic kidney disease, or unspecified chronic kidney disease: Secondary | ICD-10-CM | POA: Diagnosis not present

## 2022-06-30 DIAGNOSIS — Z794 Long term (current) use of insulin: Secondary | ICD-10-CM | POA: Diagnosis not present

## 2022-06-30 DIAGNOSIS — Z7982 Long term (current) use of aspirin: Secondary | ICD-10-CM | POA: Diagnosis not present

## 2022-06-30 DIAGNOSIS — E1122 Type 2 diabetes mellitus with diabetic chronic kidney disease: Secondary | ICD-10-CM | POA: Diagnosis not present

## 2022-07-03 ENCOUNTER — Encounter: Payer: Self-pay | Admitting: Adult Health

## 2022-07-03 ENCOUNTER — Ambulatory Visit: Payer: Self-pay

## 2022-07-03 DIAGNOSIS — I7782 Antineutrophilic cytoplasmic antibody (ANCA) vasculitis: Secondary | ICD-10-CM | POA: Diagnosis not present

## 2022-07-03 DIAGNOSIS — N3000 Acute cystitis without hematuria: Secondary | ICD-10-CM | POA: Diagnosis not present

## 2022-07-03 DIAGNOSIS — E11319 Type 2 diabetes mellitus with unspecified diabetic retinopathy without macular edema: Secondary | ICD-10-CM | POA: Diagnosis not present

## 2022-07-03 DIAGNOSIS — A0472 Enterocolitis due to Clostridium difficile, not specified as recurrent: Secondary | ICD-10-CM | POA: Diagnosis not present

## 2022-07-03 DIAGNOSIS — I739 Peripheral vascular disease, unspecified: Secondary | ICD-10-CM | POA: Diagnosis not present

## 2022-07-03 DIAGNOSIS — K5792 Diverticulitis of intestine, part unspecified, without perforation or abscess without bleeding: Secondary | ICD-10-CM | POA: Diagnosis not present

## 2022-07-03 DIAGNOSIS — R197 Diarrhea, unspecified: Secondary | ICD-10-CM | POA: Diagnosis not present

## 2022-07-03 DIAGNOSIS — E1151 Type 2 diabetes mellitus with diabetic peripheral angiopathy without gangrene: Secondary | ICD-10-CM | POA: Diagnosis not present

## 2022-07-03 DIAGNOSIS — I130 Hypertensive heart and chronic kidney disease with heart failure and stage 1 through stage 4 chronic kidney disease, or unspecified chronic kidney disease: Secondary | ICD-10-CM | POA: Diagnosis not present

## 2022-07-03 NOTE — Progress Notes (Signed)
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 [name] woman with multifactorial anemia, as follows:             (a) anemia of chronic inflammation: As of May 2020 she has a SED rate of 95, CRP 1.1, positive ANA and RF; subsequently she was found to be ANCA positive             (b) hemolysis (mild): she has a mildly elevated LDH and a DAT positive for complement, not IgG; this is c/w (a) above             (c) anemia of renal insufficiency: inadequate EPO resonse to anemia                         (d) GIB/ iron deficiency in patient on lifelong anticoagulaton   (1) Feraheme received 03/01/2019, repeated 03/20/2019              (a) reticulocyte 03/07/2019 up from 43.4 a year ago to 191.8 after iron infusion             (b) feraheme repeated on 05/12/2019             (c) subsequent ferritin levels >100   (2) darbepoetin: starting 05/05/2019 at 120mg given every 2 weeks             (a) dose increased to 200 mcg every week starting 07/01/2019              (b) back to every 2 weeks beginning 08/04/2019 still at 200 mcg dose             (c) switched to Retacrit every 4 weeks as of 08/18/2019   (3) ANCA positive vasculitis: diagnosed 05/08/2019 (titer 1:640)             (a) prednisone 40 mg daily started 05/31/2019             (b) rituximab weekly started 06/12/2019 (received test dose 06/05/2019)                         (i) hepatitis B sAg, core Ab and HIV negative 05/08/2019                         (ii) rituximab held after 07/14/2019 dose (received 5 weekly doses) with neutropenia                         (iii) rituximab resumed 08/18/2019 but changed to monthly                         (iv) rituximab changed to every 2 months beginning with 02/02/2020 dose                         (v) rituximab  changed to every 12 weeks after the 10/12/2020 dose             (c) prednisone tapered to off as of 09/15/2019                          (4)  Anemia worsened in spring 2023, refractory to Rituximab and requiring prednisone restart with slow taper due currently at '20mg'$  which she will stay at.   (5) osteopenia with a T score of -1.5 on bone density 02/28/2016 at the [facility]  CURRENT THERAPY: Prednisone  INTERVAL HISTORY: [name] [name] 81 y.o. female returns for follow-up and evaluation of her hemolytic anemia.  She was admitted to the hospital from September 14 and discharged home June 27, 2022.  She had diverticulitis, C. Difficile, and a urinary tract infection.  She screen positive for sepsis and was started on IV antibiotics that were subsequently changed to oral antibiotics.    She is accompanied by her son today to evaluate her labs to see if she needs any blood.  She continues on prednisone daily.  He has a second opinion with [facility] tomorrow for her hemolytic anemia.  Her main concern is swelling in her legs and that her stomach feels slightly painful and distended.   Patient Active Problem List   Diagnosis Date Noted   Pressure injury of skin 06/23/2022   UTI (urinary tract infection) 06/22/2022   Joint pain 06/09/2022   Parietoalveolar pneumopathy ([place]) 06/09/2022   Primary osteoarthritis 06/09/2022   Rheumatoid factor positive 06/09/2022   C. difficile colitis 04/19/2022   Colitis 04/18/2022   PAH (pulmonary artery hypertension) ([place]) 04/18/2022   Hypokalemia 04/15/2022   Colovesical fistula, ruled out 04/14/2022   Emphysematous cystitis 04/13/2022   Female bladder prolapse 04/13/2022   Diverticulitis 04/12/2022   Hyperkalemia 02/04/2022   Acute renal failure superimposed on stage 3b chronic kidney disease ([place]) 02/04/2022   Hyperglycemia 01/09/2022   Autoimmune hemolytic anemia ([place]) 01/02/2022   Bilateral lower extremity edema 01/02/2022   Refractory anemia  ([place]) 12/15/2021   Abnormal chest x-ray 12/15/2021   Subtherapeutic international normalized ratio (INR) 12/15/2021   Elevated troponin 12/15/2021   Unilateral primary osteoarthritis, right hip 05/31/2021   Midline cystocele 05/30/2021   Primary localized osteoarthritis of pelvic region and thigh 05/30/2021   Vaginal pain 05/30/2021   Asymmetric SNHL (sensorineural hearing loss) 01/06/2021   Referred otalgia of both ears 01/06/2021   Bilateral hearing loss 08/31/2020   Low back pain 02/20/2020   CKD (chronic kidney disease), stage IV ([place]) 10/13/2019   Leukopenia due to antineoplastic chemotherapy ([place]) 08/18/2019   Thrombophilia ([place]) 06/19/2019   Encounter for general adult medical examination without abnormal findings 06/13/2019   Arteritis ([place]) 06/06/2019   Hypertensive heart and chronic kidney disease with heart failure and stage 1 through stage 4 chronic kidney disease, or unspecified chronic kidney disease ([place]) 06/06/2019   Muscle weakness 06/06/2019   Pancytopenia ([place]) 05/28/2019   Dyspnea 05/28/2019   Vasculitis, ANCA positive 05/28/2019   Disorder of connective tissue ([place]) 05/14/2019   Renal insufficiency 05/08/2019   Acute on chronic diastolic CHF (congestive heart failure) ([place]) 04/07/2019   Left leg cellulitis 04/07/2019   PAF (paroxysmal atrial fibrillation) ([place]) 04/07/2019   Iron deficiency anemia 02/28/2019   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 ([place]) 02/28/2019   Diabetic retinopathy associated with type 2 diabetes mellitus ([place]) 09/02/2018   Chronic pain 06/19/2018   Non-thrombocytopenic purpura ([place]) 05/21/2018   Gout 03/22/2018   Malnutrition of moderate degree 03/12/2018   Diabetes mellitus type 2, controlled ([place]) 03/06/2018   S/P cholecystectomy 03/06/2018   H/O mechanical aortic valve replacement 03/06/2018   Moderate to severe pulmonary hypertension ([place]) 03/06/2018   GERD (gastroesophageal reflux disease) 02/21/2018    Anxiety 02/21/2018  Chronic diastolic CHF (congestive heart failure) (HCC) 02/21/2018   Diarrhea 02/21/2018   General weakness 02/02/2018   Thrombocytopenia ([place]) 01/25/2018   Arm pain, diffuse 01/25/2018   Anemia 01/23/2018   Aortic atherosclerosis ([place]) 01/23/2018   GI bleed 01/18/2018   Age-related osteoporosis without current pathological fracture 05/24/2017   Carotid artery occlusion 05/24/2017   Generalized anxiety disorder 05/24/2017   Hearing loss 05/24/2017   Presence of prosthetic heart valve 05/24/2017   Carotid artery disease ([place]) 08/18/2013   Peripheral arterial disease ([place]) 08/18/2013   Diabetes mellitus without complication ([place]) 54/06/8118   Hyponatremia 10/06/2011   Hypertension    Hypercholesteremia    Mechanical heart valve present    Chronic anticoagulation     is allergic to flagyl [metronidazole], augmentin [amoxicillin-pot clavulanate], ciprofloxacin, coreg [carvedilol], diflucan [fluconazole], losartan, privigen [immune globulin (human)], sulfamethoxazole-trimethoprim, verapamil, zetia [ezetimibe], zocor [simvastatin - high dose], and gatifloxacin.  MEDICAL HISTORY: Past Medical History:  Diagnosis Date   Aortic atherosclerosis ([place]) 01/23/2018   Atrial fibrillation (HCC)    Benign positional vertigo 10/06/2011   Carotid artery disease ([place])    Chemotherapy induced neutropenia ([place]) 07/21/2019   Cholelithiasis 01/23/2018   Cholelithiasis 01/23/2018   Chronic anticoagulation    Coronary artery disease    status post coronary artery bypass grafting times 07/10/2004   Diabetes mellitus    Hypercholesteremia    Hypertension    Mechanical heart valve present    H. aortic valve replacement at the time of bypass surgery October 2005   Moderate to severe pulmonary hypertension ([place])    Peripheral arterial disease ([place])    history of left common iliac artery PTA and stenting for a chronic total occlusion 08/26/01    SURGICAL HISTORY: Past Surgical  History:  Procedure Laterality Date   anterior repair  2009   AORTIC VALVE REPLACEMENT  2005   Dr. Cyndia Bent   CARDIAC CATHETERIZATION  11/10/2004   40% right common illiac, 70% in stent restenosis of distal left common illiac,    CARDIAC CATHETERIZATION  05/18/2004   LAD 50-70% midstenosis, RCA dominant w/50% stenosis, 50% Right common Illiac artery ostial stenosis, 90% in stent restenosis within midportion of left common illiac stent   Carotid Duplex  03/12/2012   RSA-elev. velocities suggestive of a 50-69% diameter reduction, Right&Left Bulb/Prox ICA-mild-mod.fibrous plaqueelevating Velocities abnormal study.   CHOLECYSTECTOMY N/A 03/01/2018   Procedure: LAPAROSCOPIC CHOLECYSTECTOMY;  Surgeon: Judeth Horn, MD;  Location: [facility];  Service: General;  Laterality: N/A;   COLONOSCOPY WITH PROPOFOL N/A 01/22/2018   Procedure: COLONOSCOPY WITH PROPOFOL;  Surgeon: Wilford Corner, MD;  Location: [facility];  Service: Endoscopy;  Laterality: N/A;   ESOPHAGOGASTRODUODENOSCOPY (EGD) WITH PROPOFOL N/A 01/22/2018   Procedure: ESOPHAGOGASTRODUODENOSCOPY (EGD) WITH PROPOFOL;  Surgeon: Wilford Corner, MD;  Location: [facility];  Service: Endoscopy;  Laterality: N/A;   ESOPHAGOGASTRODUODENOSCOPY (EGD) WITH PROPOFOL N/A 11/21/2018   Procedure: ESOPHAGOGASTRODUODENOSCOPY (EGD) WITH PROPOFOL;  Surgeon: [name], MD;  Location: [facility];  Service: Gastroenterology;  Laterality: N/A;   ESOPHAGOGASTRODUODENOSCOPY (EGD) WITH PROPOFOL Left 02/27/2019   Procedure: ESOPHAGOGASTRODUODENOSCOPY (EGD) WITH PROPOFOL;  Surgeon: Arta Silence, MD;  Location: [facility] ENDOSCOPY;  Service: Endoscopy;  Laterality: Left;   GIVENS CAPSULE STUDY N/A 11/21/2018   Procedure: GIVENS CAPSULE STUDY;  Surgeon: [name], MD;  Location: [facility];  Service: Gastroenterology;  Laterality: N/A;  To be deployed during EGD   Lower Ext. Duplex  03/12/2012   Right Proximal CIA- vessel narrowing w/elevated velocities 0-49% diameter  reduction. Right SFA-mild mixed  density plaque throughout vessel.   NM MYOCAR PERF WALL MOTION  05/19/2010   protocol: Persantine, post stress EF 65%, negative for ischemia, low risk scan   RIGHT HEART CATH N/A 06/27/2018   Procedure: RIGHT HEART CATH;  Surgeon: [name], MD;  Location: [facility] CV LAB;  Service: Cardiovascular;  Laterality: N/A;   TOTAL ABDOMINAL HYSTERECTOMY W/ BILATERAL SALPINGOOPHORECTOMY  1989   TRANSTHORACIC ECHOCARDIOGRAM  08/29/2012   Moderately calcified annulus of mitral valve, moderate regurg. of both mitral valve and tricuspid valve.     SOCIAL HISTORY: Social History   Socioeconomic History   Marital status: Widowed    Spouse name: Not on file   Number of children: Not on file   Years of education: Not on file   Highest education level: Not on file  Occupational History   Occupation: Retired  Tobacco Use   Smoking status: Former    Packs/day: 1.00    Years: 30.00    Total pack years: 30.00    Types: Cigarettes   Smokeless tobacco: Never   Tobacco comments:    quit smoking 2005  Vaping Use   Vaping Use: Never used  Substance and Sexual Activity   Alcohol use: No    Alcohol/week: 0.0 standard drinks of alcohol   Drug use: No   Sexual activity: Not Currently    Birth control/protection: None  Other Topics Concern   Not on file  Social History Narrative   Not on file   Social Determinants of Health   Financial Resource Strain: Low Risk  (02/28/2022)   Overall Financial Resource Strain (CARDIA)    Difficulty of Paying Living Expenses: Not hard at all  Food Insecurity: No Food Insecurity (06/22/2022)   Hunger Vital Sign    Worried About Running Out of Food in the Last Year: Never true    [place] in the Last Year: Never true  Transportation Needs: No Transportation Needs (06/22/2022)   PRAPARE - Hydrologist (Medical): No    Lack of Transportation (Non-Medical): No  Physical Activity: Inactive  (02/28/2022)   Exercise Vital Sign    Days of Exercise per Week: 0 days    Minutes of Exercise per Session: 0 min  Stress: No Stress Concern Present (02/28/2022)   [place]    Feeling of Stress : Only a little  Social Connections: Moderately Integrated (06/22/2022)   Social Connection and Isolation Panel [NHANES]    Frequency of Communication with Friends and Family: More than three times a week    Frequency of Social Gatherings with Friends and Family: More than three times a week    Attends Religious Services: More than 4 times per year    Active Member of [employer] or Organizations: Yes    Attends Archivist Meetings: More than 4 times per year    Marital Status: Widowed  Intimate Partner Violence: Not At Risk (06/22/2022)   Humiliation, Afraid, Rape, and Kick questionnaire    Fear of Current or Ex-Partner: No    Emotionally Abused: No    Physically Abused: No    Sexually Abused: No    FAMILY HISTORY: Family History  Problem Relation Age of Onset   Heart disease Father    Hypertension Father    Hyperlipidemia Father    Breast cancer Neg Hx     Review of Systems  Constitutional:  Positive for fatigue. Negative for appetite change,  chills, fever and unexpected weight change.  HENT:   Negative for hearing loss, lump/mass and trouble swallowing.   Eyes:  Negative for eye problems and icterus.  Respiratory:  Negative for chest tightness, cough and shortness of breath.   Cardiovascular:  Positive for leg swelling. Negative for chest pain and palpitations.  Gastrointestinal:  Positive for abdominal distention and abdominal pain. Negative for constipation, diarrhea, nausea and vomiting.  Endocrine: Negative for hot flashes.  Genitourinary:  Negative for difficulty urinating.   Musculoskeletal:  Negative for arthralgias.  Skin:  Negative for itching and rash.  Neurological:  Negative for dizziness, extremity  weakness, headaches and numbness.  Hematological:  Negative for adenopathy. Does not bruise/bleed easily.  Psychiatric/Behavioral:  Negative for depression. The patient is not nervous/anxious.       PHYSICAL EXAMINATION  ECOG PERFORMANCE STATUS: 2 - Symptomatic, <50% confined to bed  Vitals:   06/29/22 1209  BP: (!) 150/50  Pulse: 64  Resp: 18  Temp: 97.9 F (36.6 C)  SpO2: 99%    Physical Exam Constitutional:      General: She is not in acute distress.    Appearance: Normal appearance. She is ill-appearing (Appears chronically ill in wheelchair.). She is not toxic-appearing.  HENT:     Head: Normocephalic and atraumatic.  Eyes:     General: No scleral icterus. Cardiovascular:     Rate and Rhythm: Normal rate and regular rhythm.     Pulses: Normal pulses.     Heart sounds: Normal heart sounds.  Pulmonary:     Effort: Pulmonary effort is normal.     Breath sounds: Normal breath sounds.  Abdominal:     General: Abdomen is flat. Bowel sounds are normal. There is distension.     Palpations: Abdomen is soft. There is no mass.     Tenderness: There is abdominal tenderness (Tenderness in right and left upper quadrants.). There is no guarding or rebound.  Musculoskeletal:        General: Swelling (Bilateral lower extremity swelling.  Weight is increased 6 pounds since her last visit.) present.     Cervical back: Neck supple.  Lymphadenopathy:     Cervical: No cervical adenopathy.  Skin:    General: Skin is warm and dry.     Findings: No rash.  Neurological:     General: No focal deficit present.     Mental Status: She is alert.  Psychiatric:        Mood and Affect: Mood normal.        Behavior: Behavior normal.     LABORATORY DATA:  CBC    Component Value Date/Time   WBC 5.5 06/29/2022 1150   RBC 3.11 (L) 06/29/2022 1150   RBC 3.07 (L) 06/29/2022 1150   HGB 9.6 (L) 06/29/2022 1150   HGB 7.8 (L) 05/25/2022 1131   HCT 28.4 (L) 06/29/2022 1150   HCT 28.5 (L)  11/20/2018 1824   PLT 133 (L) 06/29/2022 1150   PLT 105 (L) 05/25/2022 1131   MCV 91.3 06/29/2022 1150   MCH 30.9 06/29/2022 1150   MCHC 33.8 06/29/2022 1150   RDW 16.3 (H) 06/29/2022 1150   LYMPHSABS 0.4 (L) 06/29/2022 1150   MONOABS 0.1 06/29/2022 1150   EOSABS 0.0 06/29/2022 1150   BASOSABS 0.0 06/29/2022 1150    CMP     Component Value Date/Time   NA 132 (L) 06/29/2022 1150   NA 137 12/06/2017 1436   K 5.3 (H) 06/29/2022 1150   CL 104  06/29/2022 1150   CO2 21 (L) 06/29/2022 1150   GLUCOSE 210 (H) 06/29/2022 1150   BUN 70 (H) 06/29/2022 1150   BUN 20 12/06/2017 1436   CREATININE 2.33 (H) 06/29/2022 1150   CALCIUM 8.1 (L) 06/29/2022 1150   PROT 5.1 (L) 06/29/2022 1150   ALBUMIN 3.2 (L) 06/29/2022 1150   AST 30 06/29/2022 1150   ALT 20 06/29/2022 1150   ALKPHOS 78 06/29/2022 1150   BILITOT 0.6 06/29/2022 1150   GFRNONAA 20 (L) 06/29/2022 1150   GFRAA 32 (L) 03/30/2020 0932      ASSESSMENT and THERAPY PLAN:   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  She continues on prednisone with good tolerance and will continue this.  Reviewed her labs in detail which are stable and she does not need any transfusions today.  She has previously been hospitalized and I forwarded her labs to her primary care provider so they can follow-up with her about any of those abnormalities.  We did get plain films of her abdomen today to evaluate for obstruction or ileus since she does have increased tenderness and distention in those areas.  I recommended that she reach out to Dr. Aundra Dubin to discuss her fluid pill if she feels like her swelling is worse.  [place] will see [facility] hematology tomorrow and we will see her back on July 07, 2022 to review their recommendations and discuss further plans.   All questions were answered. The  patient knows to call the clinic with any problems, questions or concerns. We can certainly see the patient much sooner if necessary.  Total encounter time:20 minutes*in face-to-face visit time, chart review, lab review, care coordination, order entry, and documentation of the encounter time.    Wilber Bihari, NP 07/03/22 9:14 AM Medical Oncology and Hematology [facility] [place], [place] [address] Tel. [phone]    Fax. [phone]  *Total Encounter Time as defined by the Centers for Medicare and Medicaid Services includes, in addition to the face-to-face time of a patient visit (documented in the note above) non-face-to-face time: obtaining and reviewing outside history, ordering and reviewing medications, tests or procedures, care coordination (communications with other health care professionals or caregivers) and documentation in the medical record.

## 2022-07-03 NOTE — Patient Outreach (Signed)
  Care Coordination   Initial Visit Note   07/03/2022 Name: [name] MRN: [id number] DOB: [birth year]  [name] is a 81 y.o. year old female who sees [name], [name], MD for primary care. I spoke with  [name] by phone today.  What matters to the patients health and wellness today?  Patient is concern about her kidneys and how much protein she should eat/drink.     Goals Addressed               This Visit's Progress     Patient Stated     I need help knowing how much protein to eat/drink (pt-stated)        Care Coordination Interventions: Assessed the Patient understanding of chronic kidney disease    Evaluation of current treatment plan related to chronic kidney disease self management and patient's adherence to plan as established by provider      Reviewed prescribed diet increase water intake to 48 oz daily unless otherwise directed by kidney doctor, drink Glucerna protein drink as directed  Discussed complications of poorly controlled blood pressure and diabetes including kidney impairment   Advised patient to discuss protein recommendations with provider    Assessed social determinant of health barriers, SW referral sent to help with MOM's meals, resources for caregiver assistance     Provided education on kidney disease progression    Engage patient in early, proactive and ongoing discussion about goals of care and what matters most to them    Mailed printed educational materials related to Eating Right with Chronic Kidney disease           SDOH assessments and interventions completed:  Yes  SDOH Interventions Today    Flowsheet Row Most Recent Value  SDOH Interventions   Transportation Interventions Intervention Not Indicated        Care Coordination Interventions Activated:  Yes  Care Coordination Interventions:  Yes, provided   Follow up plan: Referral made to [place] to help with MOM's meals and resources for caregiver  assistance Follow up call scheduled for 07/17/22 '@1'$ :00 PM    Encounter Outcome:  Pt. Visit Completed

## 2022-07-03 NOTE — Patient Instructions (Signed)
Visit Information  Thank you for taking time to visit with me today. Please don't hesitate to contact me if I can be of assistance to you.   Following are the goals we discussed today:   Goals Addressed               This Visit's Progress     Patient Stated     I need help knowing how much protein to eat/drink (pt-stated)        Care Coordination Interventions: Assessed the Patient understanding of chronic kidney disease    Evaluation of current treatment plan related to chronic kidney disease self management and patient's adherence to plan as established by provider      Reviewed prescribed diet increase water intake to 48 oz daily unless otherwise directed by kidney doctor, drink Glucerna protein drink as directed  Discussed complications of poorly controlled blood pressure and diabetes including kidney impairment   Advised patient to discuss protein recommendations with provider    Assessed social determinant of health barriers, SW referral sent to help with MOM's meals, resources for caregiver assistance     Provided education on kidney disease progression    Engage patient in early, proactive and ongoing discussion about goals of care and what matters most to them    Mailed printed educational materials related to Eating Right with Chronic Kidney disease           Our next appointment is by telephone on 07/17/22 at 1:00 PM  Please call the care guide team at [phone] if you need to cancel or reschedule your appointment.   If you are experiencing a Mental Health or [place] or need someone to talk to, please call 1-800-273-TALK (toll free, 24 hour hotline)  Patient verbalizes understanding of instructions and care plan provided today and agrees to view in [place]. Active MyChart status and patient understanding of how to access instructions and care plan via MyChart confirmed with patient.     Barb Merino, RN, BSN, CCM Care Management Coordinator [address] Management Direct Phone: [phone]

## 2022-07-03 NOTE — Assessment & Plan Note (Signed)
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  She continues on prednisone with good tolerance and will continue this.  Reviewed her labs in detail which are stable and she does not need any transfusions today.  She has previously been hospitalized and I forwarded her labs to her primary care provider so they can follow-up with her about any of those abnormalities.  We did get plain films of her abdomen today to evaluate for obstruction or ileus since she does have increased tenderness and distention in those areas.  I recommended that she reach out to Dr. Aundra Dubin to discuss her fluid pill if she feels like her swelling is worse.  [name] will see [facility] hematology tomorrow and we will see her back on July 07, 2022 to review their recommendations and discuss further plans.

## 2022-07-04 DIAGNOSIS — F331 Major depressive disorder, recurrent, moderate: Secondary | ICD-10-CM | POA: Diagnosis not present

## 2022-07-05 ENCOUNTER — Telehealth: Payer: Self-pay

## 2022-07-05 NOTE — Patient Outreach (Signed)
  Care Coordination   07/05/2022 Name: [name] MRN: [id number] DOB: Jan 05, 1941   Care Coordination Outreach Attempts:  An unsuccessful telephone outreach was attempted today to offer the patient information about available care coordination services as a benefit of their health plan.   Follow Up Plan:  Additional outreach attempts will be made to offer the patient care coordination information and services.   Encounter Outcome:  No Answer  Care Coordination Interventions Activated:  No   Care Coordination Interventions:  No, not indicated    Daneen Schick, BSW, CDP Social Worker, Certified Dementia Practitioner [facility] [address] Care Management  Care Coordination [phone]

## 2022-07-07 ENCOUNTER — Inpatient Hospital Stay: Payer: Medicare HMO

## 2022-07-07 ENCOUNTER — Telehealth: Payer: Self-pay | Admitting: *Deleted

## 2022-07-07 ENCOUNTER — Inpatient Hospital Stay (HOSPITAL_BASED_OUTPATIENT_CLINIC_OR_DEPARTMENT_OTHER): Payer: Medicare HMO | Admitting: Hematology and Oncology

## 2022-07-07 ENCOUNTER — Ambulatory Visit (INDEPENDENT_AMBULATORY_CARE_PROVIDER_SITE_OTHER): Payer: Medicare HMO | Admitting: Cardiovascular Disease

## 2022-07-07 ENCOUNTER — Encounter: Payer: Self-pay | Admitting: *Deleted

## 2022-07-07 ENCOUNTER — Other Ambulatory Visit: Payer: Self-pay

## 2022-07-07 ENCOUNTER — Encounter: Payer: Self-pay | Admitting: Hematology and Oncology

## 2022-07-07 ENCOUNTER — Other Ambulatory Visit: Payer: Self-pay | Admitting: *Deleted

## 2022-07-07 DIAGNOSIS — Z8619 Personal history of other infectious and parasitic diseases: Secondary | ICD-10-CM | POA: Diagnosis not present

## 2022-07-07 DIAGNOSIS — K229 Disease of esophagus, unspecified: Secondary | ICD-10-CM | POA: Diagnosis not present

## 2022-07-07 DIAGNOSIS — R278 Other lack of coordination: Secondary | ICD-10-CM | POA: Diagnosis not present

## 2022-07-07 DIAGNOSIS — D649 Anemia, unspecified: Secondary | ICD-10-CM | POA: Diagnosis not present

## 2022-07-07 DIAGNOSIS — K921 Melena: Secondary | ICD-10-CM | POA: Diagnosis present

## 2022-07-07 DIAGNOSIS — E1122 Type 2 diabetes mellitus with diabetic chronic kidney disease: Secondary | ICD-10-CM | POA: Diagnosis present

## 2022-07-07 DIAGNOSIS — R600 Localized edema: Secondary | ICD-10-CM | POA: Diagnosis not present

## 2022-07-07 DIAGNOSIS — Z952 Presence of prosthetic heart valve: Secondary | ICD-10-CM | POA: Diagnosis not present

## 2022-07-07 DIAGNOSIS — K5710 Diverticulosis of small intestine without perforation or abscess without bleeding: Secondary | ICD-10-CM | POA: Diagnosis not present

## 2022-07-07 DIAGNOSIS — D591 Autoimmune hemolytic anemia, unspecified: Secondary | ICD-10-CM | POA: Diagnosis present

## 2022-07-07 DIAGNOSIS — R52 Pain, unspecified: Secondary | ICD-10-CM | POA: Diagnosis not present

## 2022-07-07 DIAGNOSIS — Z87891 Personal history of nicotine dependence: Secondary | ICD-10-CM | POA: Diagnosis not present

## 2022-07-07 DIAGNOSIS — D631 Anemia in chronic kidney disease: Secondary | ICD-10-CM | POA: Diagnosis present

## 2022-07-07 DIAGNOSIS — E872 Acidosis, unspecified: Secondary | ICD-10-CM | POA: Diagnosis present

## 2022-07-07 DIAGNOSIS — Z515 Encounter for palliative care: Secondary | ICD-10-CM | POA: Diagnosis not present

## 2022-07-07 DIAGNOSIS — N170 Acute kidney failure with tubular necrosis: Secondary | ICD-10-CM | POA: Diagnosis present

## 2022-07-07 DIAGNOSIS — I2510 Atherosclerotic heart disease of native coronary artery without angina pectoris: Secondary | ICD-10-CM | POA: Diagnosis present

## 2022-07-07 DIAGNOSIS — Z66 Do not resuscitate: Secondary | ICD-10-CM | POA: Diagnosis present

## 2022-07-07 DIAGNOSIS — I509 Heart failure, unspecified: Secondary | ICD-10-CM | POA: Diagnosis not present

## 2022-07-07 DIAGNOSIS — I5032 Chronic diastolic (congestive) heart failure: Secondary | ICD-10-CM | POA: Diagnosis present

## 2022-07-07 DIAGNOSIS — E669 Obesity, unspecified: Secondary | ICD-10-CM | POA: Diagnosis present

## 2022-07-07 DIAGNOSIS — R2689 Other abnormalities of gait and mobility: Secondary | ICD-10-CM | POA: Diagnosis not present

## 2022-07-07 DIAGNOSIS — R531 Weakness: Secondary | ICD-10-CM | POA: Diagnosis not present

## 2022-07-07 DIAGNOSIS — D594 Other nonautoimmune hemolytic anemias: Secondary | ICD-10-CM

## 2022-07-07 DIAGNOSIS — I2721 Secondary pulmonary arterial hypertension: Secondary | ICD-10-CM | POA: Diagnosis present

## 2022-07-07 DIAGNOSIS — N178 Other acute kidney failure: Secondary | ICD-10-CM | POA: Diagnosis not present

## 2022-07-07 DIAGNOSIS — K5732 Diverticulitis of large intestine without perforation or abscess without bleeding: Secondary | ICD-10-CM | POA: Diagnosis present

## 2022-07-07 DIAGNOSIS — Z7401 Bed confinement status: Secondary | ICD-10-CM | POA: Diagnosis not present

## 2022-07-07 DIAGNOSIS — Z5181 Encounter for therapeutic drug level monitoring: Secondary | ICD-10-CM

## 2022-07-07 DIAGNOSIS — I7782 Antineutrophilic cytoplasmic antibody (ANCA) vasculitis: Secondary | ICD-10-CM | POA: Diagnosis present

## 2022-07-07 DIAGNOSIS — Z794 Long term (current) use of insulin: Secondary | ICD-10-CM | POA: Diagnosis not present

## 2022-07-07 DIAGNOSIS — K922 Gastrointestinal hemorrhage, unspecified: Secondary | ICD-10-CM | POA: Diagnosis present

## 2022-07-07 DIAGNOSIS — K5712 Diverticulitis of small intestine without perforation or abscess without bleeding: Secondary | ICD-10-CM | POA: Diagnosis present

## 2022-07-07 DIAGNOSIS — D6832 Hemorrhagic disorder due to extrinsic circulating anticoagulants: Secondary | ICD-10-CM | POA: Diagnosis present

## 2022-07-07 DIAGNOSIS — I959 Hypotension, unspecified: Secondary | ICD-10-CM | POA: Diagnosis not present

## 2022-07-07 DIAGNOSIS — I130 Hypertensive heart and chronic kidney disease with heart failure and stage 1 through stage 4 chronic kidney disease, or unspecified chronic kidney disease: Secondary | ICD-10-CM | POA: Diagnosis present

## 2022-07-07 DIAGNOSIS — M6281 Muscle weakness (generalized): Secondary | ICD-10-CM | POA: Diagnosis not present

## 2022-07-07 DIAGNOSIS — I10 Essential (primary) hypertension: Secondary | ICD-10-CM | POA: Diagnosis not present

## 2022-07-07 DIAGNOSIS — N189 Chronic kidney disease, unspecified: Secondary | ICD-10-CM

## 2022-07-07 DIAGNOSIS — D638 Anemia in other chronic diseases classified elsewhere: Secondary | ICD-10-CM | POA: Diagnosis not present

## 2022-07-07 DIAGNOSIS — E1151 Type 2 diabetes mellitus with diabetic peripheral angiopathy without gangrene: Secondary | ICD-10-CM | POA: Diagnosis present

## 2022-07-07 DIAGNOSIS — R197 Diarrhea, unspecified: Secondary | ICD-10-CM | POA: Diagnosis not present

## 2022-07-07 DIAGNOSIS — K3189 Other diseases of stomach and duodenum: Secondary | ICD-10-CM | POA: Diagnosis not present

## 2022-07-07 DIAGNOSIS — D62 Acute posthemorrhagic anemia: Secondary | ICD-10-CM | POA: Diagnosis present

## 2022-07-07 DIAGNOSIS — R1084 Generalized abdominal pain: Secondary | ICD-10-CM | POA: Diagnosis not present

## 2022-07-07 DIAGNOSIS — R739 Hyperglycemia, unspecified: Secondary | ICD-10-CM | POA: Diagnosis not present

## 2022-07-07 DIAGNOSIS — E11649 Type 2 diabetes mellitus with hypoglycemia without coma: Secondary | ICD-10-CM | POA: Diagnosis not present

## 2022-07-07 DIAGNOSIS — K5792 Diverticulitis of intestine, part unspecified, without perforation or abscess without bleeding: Secondary | ICD-10-CM | POA: Diagnosis not present

## 2022-07-07 DIAGNOSIS — Z7189 Other specified counseling: Secondary | ICD-10-CM | POA: Diagnosis not present

## 2022-07-07 DIAGNOSIS — R195 Other fecal abnormalities: Secondary | ICD-10-CM | POA: Diagnosis not present

## 2022-07-07 DIAGNOSIS — I700 Atherosclerosis of aorta: Secondary | ICD-10-CM | POA: Diagnosis not present

## 2022-07-07 DIAGNOSIS — I110 Hypertensive heart disease with heart failure: Secondary | ICD-10-CM | POA: Diagnosis not present

## 2022-07-07 DIAGNOSIS — R109 Unspecified abdominal pain: Secondary | ICD-10-CM | POA: Diagnosis not present

## 2022-07-07 DIAGNOSIS — R41841 Cognitive communication deficit: Secondary | ICD-10-CM | POA: Diagnosis not present

## 2022-07-07 DIAGNOSIS — B3781 Candidal esophagitis: Secondary | ICD-10-CM | POA: Diagnosis present

## 2022-07-07 DIAGNOSIS — N184 Chronic kidney disease, stage 4 (severe): Secondary | ICD-10-CM | POA: Diagnosis present

## 2022-07-07 DIAGNOSIS — I480 Paroxysmal atrial fibrillation: Secondary | ICD-10-CM | POA: Diagnosis present

## 2022-07-07 DIAGNOSIS — Z7901 Long term (current) use of anticoagulants: Secondary | ICD-10-CM | POA: Diagnosis not present

## 2022-07-07 LAB — CBC WITH DIFFERENTIAL/PLATELET
Abs Immature Granulocytes: 0.04 10*3/uL (ref 0.00–0.07)
Basophils Absolute: 0 10*3/uL (ref 0.0–0.1)
Basophils Relative: 0 %
Eosinophils Absolute: 0 10*3/uL (ref 0.0–0.5)
Eosinophils Relative: 0 %
HCT: 25.7 % — ABNORMAL LOW (ref 36.0–46.0)
Hemoglobin: 8.7 g/dL — ABNORMAL LOW (ref 12.0–15.0)
Immature Granulocytes: 1 %
Lymphocytes Relative: 5 %
Lymphs Abs: 0.4 10*3/uL — ABNORMAL LOW (ref 0.7–4.0)
MCH: 30.9 pg (ref 26.0–34.0)
MCHC: 33.9 g/dL (ref 30.0–36.0)
MCV: 91.1 fL (ref 80.0–100.0)
Monocytes Absolute: 0.1 10*3/uL (ref 0.1–1.0)
Monocytes Relative: 2 %
Neutro Abs: 6.5 10*3/uL (ref 1.7–7.7)
Neutrophils Relative %: 92 %
Platelets: 136 10*3/uL — ABNORMAL LOW (ref 150–400)
RBC: 2.82 MIL/uL — ABNORMAL LOW (ref 3.87–5.11)
RDW: 16 % — ABNORMAL HIGH (ref 11.5–15.5)
WBC: 7.1 10*3/uL (ref 4.0–10.5)
nRBC: 0 % (ref 0.0–0.2)

## 2022-07-07 LAB — CMP (CANCER CENTER ONLY)
ALT: 15 U/L (ref 0–44)
AST: 25 U/L (ref 15–41)
Albumin: 3.3 g/dL — ABNORMAL LOW (ref 3.5–5.0)
Alkaline Phosphatase: 71 U/L (ref 38–126)
Anion gap: 7 (ref 5–15)
BUN: 82 mg/dL — ABNORMAL HIGH (ref 8–23)
CO2: 25 mmol/L (ref 22–32)
Calcium: 8.4 mg/dL — ABNORMAL LOW (ref 8.9–10.3)
Chloride: 101 mmol/L (ref 98–111)
Creatinine: 2.21 mg/dL — ABNORMAL HIGH (ref 0.44–1.00)
GFR, Estimated: 22 mL/min — ABNORMAL LOW (ref >60)
Glucose, Bld: 167 mg/dL — ABNORMAL HIGH (ref 70–99)
Potassium: 4.6 mmol/L (ref 3.5–5.1)
Sodium: 133 mmol/L — ABNORMAL LOW (ref 135–145)
Total Bilirubin: 0.6 mg/dL (ref 0.3–1.2)
Total Protein: 5.3 g/dL — ABNORMAL LOW (ref 6.5–8.1)

## 2022-07-07 LAB — PROTIME-INR
INR: 5.4 (ref 0.8–1.2)
Prothrombin Time: 48.7 seconds — ABNORMAL HIGH (ref 11.4–15.2)

## 2022-07-07 LAB — RETIC PANEL
Immature Retic Fract: 8.5 % (ref 2.3–15.9)
RBC.: 2.8 MIL/uL — ABNORMAL LOW (ref 3.87–5.11)
Retic Count, Absolute: 72.8 10*3/uL (ref 19.0–186.0)
Retic Ct Pct: 2.6 % (ref 0.4–3.1)
Reticulocyte Hemoglobin: 35.5 pg (ref >27.9)

## 2022-07-07 LAB — SAMPLE TO BLOOD BANK

## 2022-07-07 NOTE — Progress Notes (Unsigned)
Critical INR reported to Hinda Lenis, RN at [phone] by Prudencio Burly

## 2022-07-07 NOTE — Chronic Care Management (AMB) (Signed)
  Care Coordination  Outreach Note  07/07/2022 Name: GUNHILD BAUTCH MRN: [id number] DOB: Jul 05, 1941   Care Coordination Outreach Attempts: An unsuccessful telephone outreach was attempted today to reschedule call with SW for care coordination services .  Follow Up Plan:  Additional outreach attempts will be made to offer the patient care coordination information and services.   Encounter Outcome:  No Answer  [place]  Direct Dial: [phone]

## 2022-07-07 NOTE — Progress Notes (Signed)
[facility] Follow up:    [name], [name], MD [place] [address]  DIAGNOSIS: Autoimmune hemolytic anemia  SUMMARY OF HEMATOLOGIC HISTORY:  [name] is a 81 y.o. [place] woman with multifactorial anemia, as follows:             (a) anemia of chronic inflammation: As of May 2020 she has a SED rate of 95, CRP 1.1, positive ANA and RF; subsequently she was found to be ANCA positive             (b) hemolysis (mild): she has a mildly elevated LDH and a DAT positive for complement, not IgG; this is c/w (a) above             (c) anemia of renal insufficiency: inadequate EPO resonse to anemia                         (d) GIB/ iron deficiency in patient on lifelong anticoagulaton   (1) Feraheme received 03/01/2019, repeated 03/20/2019              (a) reticulocyte 03/07/2019 up from 43.4 a year ago to 191.8 after iron infusion             (b) feraheme repeated on 05/12/2019             (c) subsequent ferritin levels >100   (2) darbepoetin: starting 05/05/2019 at 141mg given every 2 weeks             (a) dose increased to 200 mcg every week starting 07/01/2019              (b) back to every 2 weeks beginning 08/04/2019 still at 200 mcg dose             (c) switched to Retacrit every 4 weeks as of 08/18/2019   (3) ANCA positive vasculitis: diagnosed 05/08/2019 (titer 1:640)             (a) prednisone 40 mg daily started 05/31/2019             (b) rituximab weekly started 06/12/2019 (received test dose 06/05/2019)                         (i) hepatitis B sAg, core Ab and HIV negative 05/08/2019                         (ii) rituximab held after 07/14/2019 dose (received 5 weekly doses) with neutropenia                         (iii) rituximab resumed 08/18/2019 but changed to monthly                         (iv) rituximab changed to every 2 months beginning with 02/02/2020 dose                         (v) rituximab changed to every 12 weeks after  the 10/12/2020 dose             (c) prednisone tapered to off as of 09/15/2019   (4) osteopenia with a T score of -1.5 on bone density 02/28/2016 at the [facility] center  CURRENT THERAPY: Prednisone  20 mg PO daily  INTERVAL HISTORY:  [name] 81 y.o. female returns for her follow-up of anemia.  She is here with her son.  Since last visit, she followed up with hematology at [facility].  They have ordered some lab work.  She was also told to restart the erythropoietin, otherwise no recommendations to discontinue the prednisone or taper the prednisone.  They said she most likely needs a vasculitis doctor. She continues on diuretics at this time.  No new diarrhea.  She realizes that every time she drinks boost is when she is having diarrhea hence she stopped drinking boost.  She otherwise takes all the pills as instructed.  No bleeding complaints.  Patient Active Problem List   Diagnosis Date Noted   Anemia of chronic disease 07/07/2022   Pressure injury of skin 06/23/2022   UTI (urinary tract infection) 06/22/2022   Joint pain 06/09/2022   Parietoalveolar pneumopathy ([place]) 06/09/2022   Primary osteoarthritis 06/09/2022   Rheumatoid factor positive 06/09/2022   C. difficile colitis 04/19/2022   Colitis 04/18/2022   PAH (pulmonary artery hypertension) ([place]) 04/18/2022   Hypokalemia 04/15/2022   Colovesical fistula, ruled out 04/14/2022   Emphysematous cystitis 04/13/2022   Female bladder prolapse 04/13/2022   Diverticulitis 04/12/2022   Hyperkalemia 02/04/2022   Acute renal failure superimposed on stage 3b chronic kidney disease ([place]) 02/04/2022   Hyperglycemia 01/09/2022   Autoimmune hemolytic anemia ([place]) 01/02/2022   Bilateral lower extremity edema 01/02/2022   Refractory anemia ([place]) 12/15/2021   Abnormal chest x-ray 12/15/2021   Subtherapeutic international normalized ratio (INR) 12/15/2021   Elevated troponin 12/15/2021   Unilateral primary osteoarthritis, right hip 05/31/2021    Midline cystocele 05/30/2021   Primary localized osteoarthritis of pelvic region and thigh 05/30/2021   Vaginal pain 05/30/2021   Asymmetric SNHL (sensorineural hearing loss) 01/06/2021   Referred otalgia of both ears 01/06/2021   Bilateral hearing loss 08/31/2020   Low back pain 02/20/2020   CKD (chronic kidney disease), stage IV ([place]) 10/13/2019   Leukopenia due to antineoplastic chemotherapy ([place]) 08/18/2019   Thrombophilia ([place]) 06/19/2019   Encounter for general adult medical examination without abnormal findings 06/13/2019   Arteritis ([place]) 06/06/2019   Hypertensive heart and chronic kidney disease with heart failure and stage 1 through stage 4 chronic kidney disease, or unspecified chronic kidney disease ([place]) 06/06/2019   Muscle weakness 06/06/2019   Pancytopenia ([place]) 05/28/2019   Dyspnea 05/28/2019   Vasculitis, ANCA positive 05/28/2019   Disorder of connective tissue ([place]) 05/14/2019   Renal insufficiency 05/08/2019   Acute on chronic diastolic CHF (congestive heart failure) ([place]) 04/07/2019   Left leg cellulitis 04/07/2019   PAF (paroxysmal atrial fibrillation) ([place]) 04/07/2019   Iron deficiency anemia 02/28/2019   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 ([place]) 02/28/2019   Diabetic retinopathy associated with type 2 diabetes mellitus ([place]) 09/02/2018   Chronic pain 06/19/2018   Non-thrombocytopenic purpura ([place]) 05/21/2018   Gout 03/22/2018   Malnutrition of moderate degree 03/12/2018   Diabetes mellitus type 2, controlled ([place]) 03/06/2018   S/P cholecystectomy 03/06/2018   H/O mechanical aortic valve replacement 03/06/2018   Moderate to severe pulmonary hypertension ([place]) 03/06/2018   GERD (gastroesophageal reflux disease) 02/21/2018   Anxiety 02/21/2018   Chronic diastolic CHF (congestive heart failure) ([place]) 02/21/2018   Diarrhea 02/21/2018   General weakness 02/02/2018   Thrombocytopenia ([place]) 01/25/2018   Arm pain, diffuse 01/25/2018    Anemia 01/23/2018   Aortic atherosclerosis ([place]) 01/23/2018   GI bleed 01/18/2018   Age-related osteoporosis without current pathological  fracture 05/24/2017   Carotid artery occlusion 05/24/2017   Generalized anxiety disorder 05/24/2017   Hearing loss 05/24/2017   Presence of prosthetic heart valve 05/24/2017   Carotid artery disease ([place]) 08/18/2013   Peripheral arterial disease ([place]) 08/18/2013   Diabetes mellitus without complication ([place]) 56/31/4970   Hyponatremia 10/06/2011   Hypertension    Hypercholesteremia    Mechanical heart valve present    Chronic anticoagulation     is allergic to flagyl [metronidazole], augmentin [amoxicillin-pot clavulanate], ciprofloxacin, coreg [carvedilol], diflucan [fluconazole], losartan, privigen [immune globulin (human)], sulfamethoxazole-trimethoprim, verapamil, zetia [ezetimibe], zocor [simvastatin - high dose], and gatifloxacin.  MEDICAL HISTORY: Past Medical History:  Diagnosis Date   Aortic atherosclerosis ([place]) 01/23/2018   Atrial fibrillation (HCC)    Benign positional vertigo 10/06/2011   Carotid artery disease ([place])    Chemotherapy induced neutropenia ([place]) 07/21/2019   Cholelithiasis 01/23/2018   Cholelithiasis 01/23/2018   Chronic anticoagulation    Coronary artery disease    status post coronary artery bypass grafting times 07/10/2004   Diabetes mellitus    Hypercholesteremia    Hypertension    Mechanical heart valve present    H. aortic valve replacement at the time of bypass surgery October 2005   Moderate to severe pulmonary hypertension ([place])    Peripheral arterial disease ([place])    history of left common iliac artery PTA and stenting for a chronic total occlusion 08/26/01    SURGICAL HISTORY: Past Surgical History:  Procedure Laterality Date   anterior repair  2009   AORTIC VALVE REPLACEMENT  2005   Dr. Cyndia Bent   CARDIAC CATHETERIZATION  11/10/2004   40% right common illiac, 70% in stent restenosis of distal left  common illiac,    CARDIAC CATHETERIZATION  05/18/2004   LAD 50-70% midstenosis, RCA dominant w/50% stenosis, 50% Right common Illiac artery ostial stenosis, 90% in stent restenosis within midportion of left common illiac stent   Carotid Duplex  03/12/2012   RSA-elev. velocities suggestive of a 50-69% diameter reduction, Right&Left Bulb/Prox ICA-mild-mod.fibrous plaqueelevating Velocities abnormal study.   CHOLECYSTECTOMY N/A 03/01/2018   Procedure: LAPAROSCOPIC CHOLECYSTECTOMY;  Surgeon: Judeth Horn, MD;  Location: [facility];  Service: General;  Laterality: N/A;   COLONOSCOPY WITH PROPOFOL N/A 01/22/2018   Procedure: COLONOSCOPY WITH PROPOFOL;  Surgeon: Wilford Corner, MD;  Location: [place];  Service: Endoscopy;  Laterality: N/A;   ESOPHAGOGASTRODUODENOSCOPY (EGD) WITH PROPOFOL N/A 01/22/2018   Procedure: ESOPHAGOGASTRODUODENOSCOPY (EGD) WITH PROPOFOL;  Surgeon: Wilford Corner, MD;  Location: [place];  Service: Endoscopy;  Laterality: N/A;   ESOPHAGOGASTRODUODENOSCOPY (EGD) WITH PROPOFOL N/A 11/21/2018   Procedure: ESOPHAGOGASTRODUODENOSCOPY (EGD) WITH PROPOFOL;  Surgeon: [name], MD;  Location: [place];  Service: Gastroenterology;  Laterality: N/A;   ESOPHAGOGASTRODUODENOSCOPY (EGD) WITH PROPOFOL Left 02/27/2019   Procedure: ESOPHAGOGASTRODUODENOSCOPY (EGD) WITH PROPOFOL;  Surgeon: Arta Silence, MD;  Location: [facility] ENDOSCOPY;  Service: Endoscopy;  Laterality: Left;   GIVENS CAPSULE STUDY N/A 11/21/2018   Procedure: GIVENS CAPSULE STUDY;  Surgeon: [name], MD;  Location: [facility];  Service: Gastroenterology;  Laterality: N/A;  To be deployed during EGD   Lower Ext. Duplex  03/12/2012   Right Proximal CIA- vessel narrowing w/elevated velocities 0-49% diameter reduction. Right SFA-mild mixed density plaque throughout vessel.   NM MYOCAR PERF WALL MOTION  05/19/2010   protocol: Persantine, post stress EF 65%, negative for ischemia, low risk scan   RIGHT HEART CATH N/A  06/27/2018   Procedure: RIGHT HEART CATH;  Surgeon: [name], MD;  Location: [facility] [place]  [facility];  Service: Cardiovascular;  Laterality: N/A;   TOTAL ABDOMINAL HYSTERECTOMY W/ BILATERAL SALPINGOOPHORECTOMY  1989   TRANSTHORACIC ECHOCARDIOGRAM  08/29/2012   Moderately calcified annulus of mitral valve, moderate regurg. of both mitral valve and tricuspid valve.     SOCIAL HISTORY: Social History   Socioeconomic History   Marital status: Widowed    Spouse name: Not on file   Number of children: Not on file   Years of education: Not on file   Highest education level: Not on file  Occupational History   Occupation: Retired  Tobacco Use   Smoking status: Former    Packs/day: 1.00    Years: 30.00    Total pack years: 30.00    Types: Cigarettes   Smokeless tobacco: Never   Tobacco comments:    quit smoking 2005  Vaping Use   Vaping Use: Never used  Substance and Sexual Activity   Alcohol use: No    Alcohol/week: 0.0 standard drinks of alcohol   Drug use: No   Sexual activity: Not Currently    Birth control/protection: None  Other Topics Concern   Not on file  Social History Narrative   Not on file   Social Determinants of Health   Financial Resource Strain: Low Risk  (02/28/2022)   Overall Financial Resource Strain (CARDIA)    Difficulty of Paying Living Expenses: Not hard at all  Food Insecurity: No Food Insecurity (06/22/2022)   Hunger Vital Sign    Worried About Running Out of Food in the Last Year: Never true    [place] in the Last Year: Never true  Transportation Needs: No Transportation Needs (07/03/2022)   PRAPARE - Hydrologist (Medical): No    Lack of Transportation (Non-Medical): No  Physical Activity: Inactive (02/28/2022)   Exercise Vital Sign    Days of Exercise per Week: 0 days    Minutes of Exercise per Session: 0 min  Stress: No Stress Concern Present (02/28/2022)   [place]    Feeling of Stress : Only a little  Social Connections: Moderately Integrated (06/22/2022)   Social Connection and Isolation Panel [NHANES]    Frequency of Communication with Friends and Family: More than three times a week    Frequency of Social Gatherings with Friends and Family: More than three times a week    Attends Religious Services: More than 4 times per year    Active Member of [employer] or Organizations: Yes    Attends Archivist Meetings: More than 4 times per year    Marital Status: Widowed  Intimate Partner Violence: Not At Risk (06/22/2022)   Humiliation, Afraid, Rape, and Kick questionnaire    Fear of Current or Ex-Partner: No    Emotionally Abused: No    Physically Abused: No    Sexually Abused: No    FAMILY HISTORY: Family History  Problem Relation Age of Onset   Heart disease Father    Hypertension Father    Hyperlipidemia Father    Breast cancer Neg Hx     Review of Systems  Constitutional:  Positive for fatigue. Negative for appetite change, chills, fever and unexpected weight change.  HENT:   Negative for hearing loss, lump/mass and trouble swallowing.   Eyes:  Negative for eye problems and icterus.  Respiratory:  Negative for chest tightness, cough and shortness of breath.   Cardiovascular:  Positive for leg swelling. Negative for  chest pain and palpitations.  Gastrointestinal:  Negative for abdominal distention, abdominal pain, constipation, diarrhea, nausea and vomiting.  Endocrine: Negative for hot flashes.  Genitourinary:  Negative for difficulty urinating.   Musculoskeletal:  Negative for arthralgias.  Skin:  Positive for rash. Negative for itching.  Neurological:  Negative for dizziness, extremity weakness, headaches and numbness.  Hematological:  Negative for adenopathy. Does not bruise/bleed easily.  Psychiatric/Behavioral:  Negative for depression. The patient is not nervous/anxious.       PHYSICAL  EXAMINATION  ECOG PERFORMANCE STATUS: 1 - Symptomatic but completely ambulatory  Vitals:   07/07/22 1252  BP: (!) 147/44  Pulse: 77  Resp: 16  Temp: 98.1 F (36.7 C)  SpO2: 99%    Physical Exam Constitutional:      General: She is not in acute distress.    Appearance: Normal appearance. She is not toxic-appearing.     Comments: She is in a wheelchair  HENT:     Head: Normocephalic and atraumatic.  Eyes:     General: No scleral icterus. Cardiovascular:     Rate and Rhythm: Normal rate and regular rhythm.     Pulses: Normal pulses.     Heart sounds: Normal heart sounds.  Pulmonary:     Effort: Pulmonary effort is normal.     Breath sounds: Normal breath sounds.  Musculoskeletal:        General: Swelling (Legs wrapped in dressing) present.     Cervical back: Neck supple.  Lymphadenopathy:     Cervical: No cervical adenopathy.  Skin:    General: Skin is warm and dry.     Findings: No rash.  Neurological:     Mental Status: She is alert.  Psychiatric:        Mood and Affect: Mood normal.        Behavior: Behavior normal.     LABORATORY DATA:  CBC    Component Value Date/Time   WBC 7.1 07/07/2022 1206   RBC 2.80 (L) 07/07/2022 1207   RBC 2.82 (L) 07/07/2022 1206   HGB 8.7 (L) 07/07/2022 1206   HGB 7.8 (L) 05/25/2022 1131   HCT 25.7 (L) 07/07/2022 1206   HCT 28.5 (L) 11/20/2018 1824   PLT 136 (L) 07/07/2022 1206   PLT 105 (L) 05/25/2022 1131   MCV 91.1 07/07/2022 1206   MCH 30.9 07/07/2022 1206   MCHC 33.9 07/07/2022 1206   RDW 16.0 (H) 07/07/2022 1206   LYMPHSABS 0.4 (L) 07/07/2022 1206   MONOABS 0.1 07/07/2022 1206   EOSABS 0.0 07/07/2022 1206   BASOSABS 0.0 07/07/2022 1206    CMP     Component Value Date/Time   NA 133 (L) 07/07/2022 1206   NA 137 12/06/2017 1436   K 4.6 07/07/2022 1206   CL 101 07/07/2022 1206   CO2 25 07/07/2022 1206   GLUCOSE 167 (H) 07/07/2022 1206   BUN 82 (H) 07/07/2022 1206   BUN 20 12/06/2017 1436   CREATININE 2.21  (H) 07/07/2022 1206   CALCIUM 8.4 (L) 07/07/2022 1206   PROT 5.3 (L) 07/07/2022 1206   ALBUMIN 3.3 (L) 07/07/2022 1206   AST 25 07/07/2022 1206   ALT 15 07/07/2022 1206   ALKPHOS 71 07/07/2022 1206   BILITOT 0.6 07/07/2022 1206   GFRNONAA 22 (L) 07/07/2022 1206   GFRAA 32 (L) 03/30/2020 0932     ASSESSMENT and THERAPY PLAN:   [name] is a 81 y.o. female who presents for multifactorial anemia, previously treated with rituximab for  ANCA positive hemolytic anemia, previously on maintenance rituximab every 12 weeks presented with worsening anemia thought to be related to her hemolysis.  She was diagnosed with elevated ANCA titers 1 is to 640 and previously treated for ANCA positive hemolytic anemia, seen by rheumatology Dr. Salome Holmes back in 2020, responded at that time to prednisone and rituximab and continued on rituximab maintenance.  However her rituximab was changed to every 7-month infusions and she relapsed before her second cycle of every 84-month rituximab.   # Multifactorial Anemia:   --Bone marrow biopsy from 02/20/2022 showed normocellular marrow with trilineage hematopoiesis. No leukemia, lymphoma or high-grade dysplasia identified. --CT CAP from 02/20/2022 did not show any underlying cause for anemia.  --ANCA titers are normal when checked on 02/10/2022 --Currently on prednisone 20 mg PO daily, mycophenolate was increased to 1000 mg however this was discontinued while she was hospitalized -- So far we have attempted prednisone taper 4 times and this was not successful.  Every time we dropped her dose below 20 mg, she has worsening hemolysis and anemia requiring blood transfusion.  We have talked on multiple instances about the complications with steroids including hyperglycemia, lower extremity swelling and however since this seems to be the only effective and reasonably well-tolerated medication, we have agreed to continue it at the current dose.   --Patient is  scheduled  with [facility] for second opinion on 06/30/2022 for any additional recommendations.   --No indication for transfusion today We will restart the Retacrit again.  In the past even with April 60,000 units every other week, she did not have much of a response. --- I briefly reviewed the note and labs, no obvious abnormal labs except mildly elevated LDH and low haptoglobin. -- Again, this is a very complex case, but every time we attempted prednisone taper in the past, her anemia significantly worsened. We will await any further recommendations from [facility] team.  #Erythema of bilateral lower extremities, likely venous stasis, no definitive evidence of cellulitis. -- This is stable.   #Warfarin therapy for St. Jude's valve --Under the care of  Coumadin Clinic.  INR today is 5, Coumadin clinic will over see this. We conveyed the results to coumadin clinic.  #CKD:  -- Followed by nephrology.  # BLE edema, on lasix  RTC every other week for labs and appointments. She however and her son were strongly encouraged to give [place] a call sooner if she starts developing worsening fatigue, shortness of breath on exertion or other symptoms indicating need for transfusion  Patient expressed understanding of the plan provided. She is aware to reach out if she has any new questions or concerns.   I have spent a total of 30 minutes minutes of face-to-face and non-face-to-face time, preparing to see the patient, performing a medically appropriate examination, counseling and educating the patient, documenting clinical information in the electronic health record, and care coordination.   Benay Pike MD

## 2022-07-08 ENCOUNTER — Encounter (HOSPITAL_COMMUNITY): Payer: Self-pay

## 2022-07-08 ENCOUNTER — Other Ambulatory Visit: Payer: Self-pay

## 2022-07-08 ENCOUNTER — Inpatient Hospital Stay (HOSPITAL_COMMUNITY)
Admission: EM | Admit: 2022-07-08 | Discharge: 2022-07-14 | DRG: 377 | Disposition: A | Discharge status: Skilled Nursing Facility | Payer: Medicare HMO | Attending: Internal Medicine | Admitting: Internal Medicine

## 2022-07-08 ENCOUNTER — Emergency Department (HOSPITAL_COMMUNITY): Payer: Medicare HMO

## 2022-07-08 DIAGNOSIS — I480 Paroxysmal atrial fibrillation: Secondary | ICD-10-CM | POA: Diagnosis present

## 2022-07-08 DIAGNOSIS — K3189 Other diseases of stomach and duodenum: Secondary | ICD-10-CM | POA: Diagnosis present

## 2022-07-08 DIAGNOSIS — K5712 Diverticulitis of small intestine without perforation or abscess without bleeding: Secondary | ICD-10-CM | POA: Diagnosis present

## 2022-07-08 DIAGNOSIS — K5732 Diverticulitis of large intestine without perforation or abscess without bleeding: Secondary | ICD-10-CM | POA: Diagnosis present

## 2022-07-08 DIAGNOSIS — I5032 Chronic diastolic (congestive) heart failure: Secondary | ICD-10-CM | POA: Diagnosis present

## 2022-07-08 DIAGNOSIS — E872 Acidosis, unspecified: Secondary | ICD-10-CM | POA: Diagnosis present

## 2022-07-08 DIAGNOSIS — D6832 Hemorrhagic disorder due to extrinsic circulating anticoagulants: Secondary | ICD-10-CM | POA: Diagnosis present

## 2022-07-08 DIAGNOSIS — D591 Autoimmune hemolytic anemia, unspecified: Secondary | ICD-10-CM | POA: Diagnosis not present

## 2022-07-08 DIAGNOSIS — R600 Localized edema: Secondary | ICD-10-CM | POA: Diagnosis present

## 2022-07-08 DIAGNOSIS — Z794 Long term (current) use of insulin: Secondary | ICD-10-CM

## 2022-07-08 DIAGNOSIS — K922 Gastrointestinal hemorrhage, unspecified: Secondary | ICD-10-CM | POA: Diagnosis not present

## 2022-07-08 DIAGNOSIS — E1122 Type 2 diabetes mellitus with diabetic chronic kidney disease: Secondary | ICD-10-CM | POA: Diagnosis present

## 2022-07-08 DIAGNOSIS — Z7983 Long term (current) use of bisphosphonates: Secondary | ICD-10-CM

## 2022-07-08 DIAGNOSIS — K921 Melena: Secondary | ICD-10-CM | POA: Diagnosis present

## 2022-07-08 DIAGNOSIS — I2510 Atherosclerotic heart disease of native coronary artery without angina pectoris: Secondary | ICD-10-CM | POA: Diagnosis present

## 2022-07-08 DIAGNOSIS — E11649 Type 2 diabetes mellitus with hypoglycemia without coma: Secondary | ICD-10-CM | POA: Diagnosis not present

## 2022-07-08 DIAGNOSIS — I7782 Antineutrophilic cytoplasmic antibody (ANCA) vasculitis: Secondary | ICD-10-CM | POA: Diagnosis present

## 2022-07-08 DIAGNOSIS — Z8619 Personal history of other infectious and parasitic diseases: Secondary | ICD-10-CM

## 2022-07-08 DIAGNOSIS — M7989 Other specified soft tissue disorders: Secondary | ICD-10-CM | POA: Diagnosis not present

## 2022-07-08 DIAGNOSIS — Z7901 Long term (current) use of anticoagulants: Secondary | ICD-10-CM

## 2022-07-08 DIAGNOSIS — Z880 Allergy status to penicillin: Secondary | ICD-10-CM

## 2022-07-08 DIAGNOSIS — N184 Chronic kidney disease, stage 4 (severe): Secondary | ICD-10-CM | POA: Diagnosis not present

## 2022-07-08 DIAGNOSIS — I509 Heart failure, unspecified: Secondary | ICD-10-CM | POA: Diagnosis not present

## 2022-07-08 DIAGNOSIS — B3781 Candidal esophagitis: Secondary | ICD-10-CM | POA: Diagnosis present

## 2022-07-08 DIAGNOSIS — I700 Atherosclerosis of aorta: Secondary | ICD-10-CM | POA: Diagnosis not present

## 2022-07-08 DIAGNOSIS — D62 Acute posthemorrhagic anemia: Secondary | ICD-10-CM | POA: Diagnosis present

## 2022-07-08 DIAGNOSIS — D649 Anemia, unspecified: Secondary | ICD-10-CM

## 2022-07-08 DIAGNOSIS — Z87891 Personal history of nicotine dependence: Secondary | ICD-10-CM

## 2022-07-08 DIAGNOSIS — Z5181 Encounter for therapeutic drug level monitoring: Secondary | ICD-10-CM

## 2022-07-08 DIAGNOSIS — I10 Essential (primary) hypertension: Secondary | ICD-10-CM | POA: Diagnosis not present

## 2022-07-08 DIAGNOSIS — Z7189 Other specified counseling: Secondary | ICD-10-CM | POA: Diagnosis not present

## 2022-07-08 DIAGNOSIS — Z9221 Personal history of antineoplastic chemotherapy: Secondary | ICD-10-CM

## 2022-07-08 DIAGNOSIS — Z952 Presence of prosthetic heart valve: Secondary | ICD-10-CM

## 2022-07-08 DIAGNOSIS — T45515A Adverse effect of anticoagulants, initial encounter: Secondary | ICD-10-CM | POA: Diagnosis present

## 2022-07-08 DIAGNOSIS — Z66 Do not resuscitate: Secondary | ICD-10-CM

## 2022-07-08 DIAGNOSIS — R52 Pain, unspecified: Secondary | ICD-10-CM | POA: Diagnosis not present

## 2022-07-08 DIAGNOSIS — D631 Anemia in chronic kidney disease: Secondary | ICD-10-CM | POA: Diagnosis present

## 2022-07-08 DIAGNOSIS — I2721 Secondary pulmonary arterial hypertension: Secondary | ICD-10-CM | POA: Diagnosis present

## 2022-07-08 DIAGNOSIS — Z9049 Acquired absence of other specified parts of digestive tract: Secondary | ICD-10-CM

## 2022-07-08 DIAGNOSIS — N170 Acute kidney failure with tubular necrosis: Secondary | ICD-10-CM | POA: Diagnosis present

## 2022-07-08 DIAGNOSIS — E860 Dehydration: Secondary | ICD-10-CM | POA: Diagnosis present

## 2022-07-08 DIAGNOSIS — E119 Type 2 diabetes mellitus without complications: Secondary | ICD-10-CM

## 2022-07-08 DIAGNOSIS — K5710 Diverticulosis of small intestine without perforation or abscess without bleeding: Secondary | ICD-10-CM | POA: Diagnosis not present

## 2022-07-08 DIAGNOSIS — M419 Scoliosis, unspecified: Secondary | ICD-10-CM | POA: Diagnosis present

## 2022-07-08 DIAGNOSIS — Z515 Encounter for palliative care: Secondary | ICD-10-CM

## 2022-07-08 DIAGNOSIS — D689 Coagulation defect, unspecified: Secondary | ICD-10-CM

## 2022-07-08 DIAGNOSIS — Z881 Allergy status to other antibiotic agents status: Secondary | ICD-10-CM

## 2022-07-08 DIAGNOSIS — K2980 Duodenitis without bleeding: Secondary | ICD-10-CM | POA: Diagnosis present

## 2022-07-08 DIAGNOSIS — I130 Hypertensive heart and chronic kidney disease with heart failure and stage 1 through stage 4 chronic kidney disease, or unspecified chronic kidney disease: Secondary | ICD-10-CM | POA: Diagnosis present

## 2022-07-08 DIAGNOSIS — E669 Obesity, unspecified: Secondary | ICD-10-CM | POA: Diagnosis present

## 2022-07-08 DIAGNOSIS — F419 Anxiety disorder, unspecified: Secondary | ICD-10-CM | POA: Diagnosis present

## 2022-07-08 DIAGNOSIS — Z79899 Other long term (current) drug therapy: Secondary | ICD-10-CM

## 2022-07-08 DIAGNOSIS — K229 Disease of esophagus, unspecified: Secondary | ICD-10-CM | POA: Diagnosis not present

## 2022-07-08 DIAGNOSIS — K5792 Diverticulitis of intestine, part unspecified, without perforation or abscess without bleeding: Secondary | ICD-10-CM | POA: Diagnosis present

## 2022-07-08 DIAGNOSIS — Z6830 Body mass index (BMI) 30.0-30.9, adult: Secondary | ICD-10-CM

## 2022-07-08 DIAGNOSIS — K219 Gastro-esophageal reflux disease without esophagitis: Secondary | ICD-10-CM | POA: Diagnosis present

## 2022-07-08 DIAGNOSIS — R195 Other fecal abnormalities: Secondary | ICD-10-CM | POA: Diagnosis not present

## 2022-07-08 DIAGNOSIS — Z87440 Personal history of urinary (tract) infections: Secondary | ICD-10-CM

## 2022-07-08 DIAGNOSIS — Z608 Other problems related to social environment: Secondary | ICD-10-CM | POA: Diagnosis present

## 2022-07-08 DIAGNOSIS — Z83438 Family history of other disorder of lipoprotein metabolism and other lipidemia: Secondary | ICD-10-CM

## 2022-07-08 DIAGNOSIS — Z7952 Long term (current) use of systemic steroids: Secondary | ICD-10-CM

## 2022-07-08 DIAGNOSIS — R791 Abnormal coagulation profile: Secondary | ICD-10-CM

## 2022-07-08 DIAGNOSIS — E7800 Pure hypercholesterolemia, unspecified: Secondary | ICD-10-CM | POA: Diagnosis present

## 2022-07-08 DIAGNOSIS — E1151 Type 2 diabetes mellitus with diabetic peripheral angiopathy without gangrene: Secondary | ICD-10-CM | POA: Diagnosis present

## 2022-07-08 DIAGNOSIS — Z951 Presence of aortocoronary bypass graft: Secondary | ICD-10-CM

## 2022-07-08 DIAGNOSIS — Z8249 Family history of ischemic heart disease and other diseases of the circulatory system: Secondary | ICD-10-CM

## 2022-07-08 DIAGNOSIS — Z888 Allergy status to other drugs, medicaments and biological substances status: Secondary | ICD-10-CM

## 2022-07-08 DIAGNOSIS — Z7982 Long term (current) use of aspirin: Secondary | ICD-10-CM

## 2022-07-08 DIAGNOSIS — R109 Unspecified abdominal pain: Secondary | ICD-10-CM | POA: Diagnosis not present

## 2022-07-08 DIAGNOSIS — R5381 Other malaise: Secondary | ICD-10-CM | POA: Diagnosis present

## 2022-07-08 DIAGNOSIS — I110 Hypertensive heart disease with heart failure: Secondary | ICD-10-CM | POA: Diagnosis not present

## 2022-07-08 LAB — PROTIME-INR
INR: 5.4 (ref 0.8–1.2)
Prothrombin Time: 48.8 seconds — ABNORMAL HIGH (ref 11.4–15.2)

## 2022-07-08 LAB — COMPREHENSIVE METABOLIC PANEL
ALT: 16 U/L (ref 0–44)
AST: 23 U/L (ref 15–41)
Albumin: 2.5 g/dL — ABNORMAL LOW (ref 3.5–5.0)
Alkaline Phosphatase: 53 U/L (ref 38–126)
Anion gap: 11 (ref 5–15)
BUN: 138 mg/dL — ABNORMAL HIGH (ref 8–23)
CO2: 19 mmol/L — ABNORMAL LOW (ref 22–32)
Calcium: 7.9 mg/dL — ABNORMAL LOW (ref 8.9–10.3)
Chloride: 102 mmol/L (ref 98–111)
Creatinine, Ser: 2.77 mg/dL — ABNORMAL HIGH (ref 0.44–1.00)
GFR, Estimated: 16 mL/min — ABNORMAL LOW (ref >60)
Glucose, Bld: 354 mg/dL — ABNORMAL HIGH (ref 70–99)
Potassium: 5.2 mmol/L — ABNORMAL HIGH (ref 3.5–5.1)
Sodium: 132 mmol/L — ABNORMAL LOW (ref 135–145)
Total Bilirubin: 0.7 mg/dL (ref 0.3–1.2)
Total Protein: 4.3 g/dL — ABNORMAL LOW (ref 6.5–8.1)

## 2022-07-08 LAB — CBC
HCT: 17.6 % — ABNORMAL LOW (ref 36.0–46.0)
Hemoglobin: 5.6 g/dL — CL (ref 12.0–15.0)
MCH: 30.8 pg (ref 26.0–34.0)
MCHC: 31.8 g/dL (ref 30.0–36.0)
MCV: 96.7 fL (ref 80.0–100.0)
Platelets: 148 10*3/uL — ABNORMAL LOW (ref 150–400)
RBC: 1.82 MIL/uL — ABNORMAL LOW (ref 3.87–5.11)
RDW: 17 % — ABNORMAL HIGH (ref 11.5–15.5)
WBC: 6.5 10*3/uL (ref 4.0–10.5)
nRBC: 0.3 % — ABNORMAL HIGH (ref 0.0–0.2)

## 2022-07-08 LAB — PREPARE RBC (CROSSMATCH)

## 2022-07-08 LAB — POC OCCULT BLOOD, ED: Fecal Occult Bld: POSITIVE — AB

## 2022-07-08 LAB — LACTIC ACID, PLASMA: Lactic Acid, Venous: 2.7 mmol/L (ref 0.5–1.9)

## 2022-07-08 LAB — LIPASE, BLOOD: Lipase: 51 U/L (ref 11–51)

## 2022-07-08 MED ORDER — HYDROMORPHONE HCL 1 MG/ML IJ SOLN
0.5000 mg | Freq: Once | INTRAMUSCULAR | Status: AC
  Administered 2022-07-08: 0.5 mg via INTRAVENOUS
  Filled 2022-07-08: qty 1

## 2022-07-08 MED ORDER — PANTOPRAZOLE 80MG IVPB - SIMPLE MED
80.0000 mg | Freq: Once | INTRAVENOUS | Status: AC
  Administered 2022-07-08: 80 mg via INTRAVENOUS
  Filled 2022-07-08: qty 100

## 2022-07-08 MED ORDER — SODIUM CHLORIDE 0.9 % IV SOLN
10.0000 mL/h | Freq: Once | INTRAVENOUS | Status: AC
  Administered 2022-07-08: 10 mL/h via INTRAVENOUS

## 2022-07-08 MED ORDER — PIPERACILLIN-TAZOBACTAM IN DEX 2-0.25 GM/50ML IV SOLN
2.2500 g | Freq: Three times a day (TID) | INTRAVENOUS | Status: AC
  Administered 2022-07-09 – 2022-07-12 (×11): 2.25 g via INTRAVENOUS
  Filled 2022-07-08 (×11): qty 50

## 2022-07-08 MED ORDER — ONDANSETRON 4 MG PO TBDP
4.0000 mg | ORAL_TABLET | Freq: Once | ORAL | Status: AC | PRN
  Administered 2022-07-08: 4 mg via ORAL
  Filled 2022-07-08: qty 1

## 2022-07-08 MED ORDER — VITAMIN K1 10 MG/ML IJ SOLN
1.0000 mg | Freq: Once | INTRAVENOUS | Status: AC
  Administered 2022-07-08: 1 mg via INTRAVENOUS
  Filled 2022-07-08: qty 0.1

## 2022-07-08 MED ORDER — FUROSEMIDE 10 MG/ML IJ SOLN
40.0000 mg | INTRAMUSCULAR | Status: AC | PRN
  Administered 2022-07-09: 40 mg via INTRAVENOUS
  Filled 2022-07-08: qty 4

## 2022-07-08 MED ORDER — VANCOMYCIN HCL 125 MG PO CAPS
125.0000 mg | ORAL_CAPSULE | Freq: Four times a day (QID) | ORAL | Status: DC
  Filled 2022-07-08 (×2): qty 1

## 2022-07-08 MED ORDER — PANTOPRAZOLE INFUSION (NEW) - SIMPLE MED
8.0000 mg/h | INTRAVENOUS | Status: AC
  Administered 2022-07-09 – 2022-07-11 (×5): 8 mg/h via INTRAVENOUS
  Filled 2022-07-08 (×2): qty 80
  Filled 2022-07-08: qty 100
  Filled 2022-07-08 (×2): qty 80
  Filled 2022-07-08: qty 100
  Filled 2022-07-08: qty 80

## 2022-07-08 MED ORDER — PIPERACILLIN-TAZOBACTAM 3.375 G IVPB 30 MIN
3.3750 g | Freq: Once | INTRAVENOUS | Status: AC
  Administered 2022-07-08: 3.375 g via INTRAVENOUS
  Filled 2022-07-08: qty 50

## 2022-07-08 NOTE — ED Notes (Signed)
With CT 

## 2022-07-08 NOTE — Assessment & Plan Note (Signed)
Chronic. Unable to diurese much due to her CKD stage 4.

## 2022-07-08 NOTE — Assessment & Plan Note (Signed)
EKG shows afib. Rate controlled.

## 2022-07-08 NOTE — ED Provider Triage Note (Signed)
Emergency Medicine Provider Triage Evaluation Note  [name] , a 81 y.o. female  was evaluated in triage.  Pt complains of generalized abdominal pain and dark colored diarrhea for two days.  She was recently admitted a couple weeks ago for urinary tract infection and received IV antibiotics.  She also has had several bacterial GI infections over the summer including C. difficile.  She was recently on oral vancomycin for C. difficile prophylaxis while in the hospital.  She is on warfarin for atrial fibrillation. She notes that her blood sugars  have been higher than normal  Review of Systems  Positive:  Negative:   Physical Exam  BP (!) 133/50 (BP Location: Right Arm)   Pulse 89   Temp 97.6 F (36.4 C) (Oral)   Resp 16   SpO2 100%  Gen:   Awake, no distress   Resp:  Normal effort  MSK:   Moves extremities without difficulty  Other:  Generalized abdominal pain, severe.  Medical Decision Making  Medically screening exam initiated at 7:42 PM.  Appropriate orders placed.  [name] was informed that the remainder of the evaluation will be completed by another provider, this initial triage assessment does not replace that evaluation, and the importance of remaining in the ED until their evaluation is complete.     Adolphus Birchwood, [place] 07/08/22 1943

## 2022-07-08 NOTE — Subjective & Objective (Signed)
CC: melanotic stools HPI: 81 year old Caucasian female with a huge list of chronic medical problems including chronic anemia due to hemolytic anemia, anemia of chronic kidney disease, history of mechanical aortic valve on chronic Coumadin, ANCA positive vasculitis on prednisone, chronic A-fib, chronic diastolic heart failure, CKD stage IV baseline creatinine 2.2-2.4, type 2 diabetes, history of C. difficile colitis who presents to the ER today with onset of melanotic stools starting yesterday evening.  She was seen yesterday afternoon by her hematologist who follows her for hemolytic anemia.  Patient's been unable to wean off of prednisone due to worsening anemia.  Patient states that after she got home from her doctor's appointment, she had a sudden urge to defecate.  She started having melanotic stools that started around 6 PM.  She has been having melanotic stools all night long.  She has been very weak.  She is not having any fevers.  She started having some abdominal pain today.  EMS was called.  On arrival temp 97.6 heart rate 89 blood pressure 133/50 satting high percent on room air.  Labs showed an INR 5.4, lipase of 51  Sodium 132, potassium 5.2, BUN of 138, creatinine 2.77, total protein 4.3, albumin 2.5  EDP documents the patient had a melanotic stools.  White count 6.5, hemoglobin 5.6, platelets 148  CT abdomen pelvis demonstrates thickening around the third and fourth portion of the duodenum with mesenteric fat stranding.  Impression was duodenitis and duodenal diverticulitis.  [facility] hospitalist contacted for admission.

## 2022-07-08 NOTE — H&P (Signed)
History and Physical    [name] GBT:[id number] DOB: 08-29-1941 DOA: 07/08/2022  DOS: the patient was seen and examined on 07/08/2022  PCP: [name], [name], MD   Patient coming from: Home  I have personally briefly reviewed patient's old medical records in [place]  CC: melanotic stools HPI: 81 year old Caucasian female with a huge list of chronic medical problems including chronic anemia due to hemolytic anemia, anemia of chronic kidney disease, history of mechanical aortic valve on chronic Coumadin, ANCA positive vasculitis on prednisone, chronic A-fib, chronic diastolic heart failure, CKD stage IV baseline creatinine 2.2-2.4, type 2 diabetes, history of C. difficile colitis who presents to the ER today with onset of melanotic stools starting yesterday evening.  She was seen yesterday afternoon by her hematologist who follows her for hemolytic anemia.  Patient's been unable to wean off of prednisone due to worsening anemia.  Patient states that after she got home from her doctor's appointment, she had a sudden urge to defecate.  She started having melanotic stools that started around 6 PM.  She has been having melanotic stools all night long.  She has been very weak.  She is not having any fevers.  She started having some abdominal pain today.  EMS was called.  On arrival temp 97.6 heart rate 89 blood pressure 133/50 satting high percent on room air.  Labs showed an INR 5.4, lipase of 51  Sodium 132, potassium 5.2, BUN of 138, creatinine 2.77, total protein 4.3, albumin 2.5  EDP documents the patient had a melanotic stools.  White count 6.5, hemoglobin 5.6, platelets 148  CT abdomen pelvis demonstrates thickening around the third and fourth portion of the duodenum with mesenteric fat stranding.  Impression was duodenitis and duodenal diverticulitis.  [facility] contacted for admission.   ED Course: INR 5.4, HgB 5.6, CT shows duodenitis,  diverticulitis  Review of Systems:  Review of Systems  Constitutional:  Positive for weight loss. Negative for fever.  HENT: Negative.    Eyes: Negative.   Respiratory: Negative.    Cardiovascular: Negative.   Gastrointestinal:  Positive for diarrhea and melena.  Musculoskeletal:  Positive for back pain.  Skin:        bruising  Neurological:  Positive for weakness.  Endo/Heme/Allergies:  Bruises/bleeds easily.  Psychiatric/Behavioral:  Positive for depression.   All other systems reviewed and are negative.   Past Medical History:  Diagnosis Date   Aortic atherosclerosis ([place]) 01/23/2018   Atrial fibrillation (HCC)    Benign positional vertigo 10/06/2011   Carotid artery disease [facility])    Chemotherapy induced neutropenia ([place]) 07/21/2019   Cholelithiasis 01/23/2018   Cholelithiasis 01/23/2018   Chronic anticoagulation    Colovesical fistula, ruled out 04/14/2022   Coronary artery disease    status post coronary artery bypass grafting times 07/10/2004   Diabetes mellitus    Hypercholesteremia    Hypertension    Mechanical heart valve present    H. aortic valve replacement at the time of bypass surgery October 2005   Moderate to severe pulmonary hypertension ([place])    Peripheral arterial disease ([place])    history of left common iliac artery PTA and stenting for a chronic total occlusion 08/26/01   S/P cholecystectomy 03/06/2018    Past Surgical History:  Procedure Laterality Date   anterior repair  2009   AORTIC VALVE REPLACEMENT  2005   Dr. Cyndia Bent   CARDIAC CATHETERIZATION  11/10/2004   40% right common illiac, 70% in stent restenosis of  distal left common illiac,    CARDIAC CATHETERIZATION  05/18/2004   LAD 50-70% midstenosis, RCA dominant w/50% stenosis, 50% Right common Illiac artery ostial stenosis, 90% in stent restenosis within midportion of left common illiac stent   Carotid Duplex  03/12/2012   RSA-elev. velocities suggestive of a 50-69% diameter reduction,  Right&Left Bulb/Prox ICA-mild-mod.fibrous plaqueelevating Velocities abnormal study.   CHOLECYSTECTOMY N/A 03/01/2018   Procedure: LAPAROSCOPIC CHOLECYSTECTOMY;  Surgeon: Judeth Horn, MD;  Location: [place];  Service: General;  Laterality: N/A;   COLONOSCOPY WITH PROPOFOL N/A 01/22/2018   Procedure: COLONOSCOPY WITH PROPOFOL;  Surgeon: Wilford Corner, MD;  Location: [place];  Service: Endoscopy;  Laterality: N/A;   ESOPHAGOGASTRODUODENOSCOPY (EGD) WITH PROPOFOL N/A 01/22/2018   Procedure: ESOPHAGOGASTRODUODENOSCOPY (EGD) WITH PROPOFOL;  Surgeon: Wilford Corner, MD;  Location: [place];  Service: Endoscopy;  Laterality: N/A;   ESOPHAGOGASTRODUODENOSCOPY (EGD) WITH PROPOFOL N/A 11/21/2018   Procedure: ESOPHAGOGASTRODUODENOSCOPY (EGD) WITH PROPOFOL;  Surgeon: [name], MD;  Location: [place];  Service: Gastroenterology;  Laterality: N/A;   ESOPHAGOGASTRODUODENOSCOPY (EGD) WITH PROPOFOL Left 02/27/2019   Procedure: ESOPHAGOGASTRODUODENOSCOPY (EGD) WITH PROPOFOL;  Surgeon: Arta Silence, MD;  [facility] ENDOSCOPY;  Service: Endoscopy;  Laterality: Left;   GIVENS CAPSULE STUDY N/A 11/21/2018   Procedure: GIVENS CAPSULE STUDY;  Surgeon: [name], MD;  Location: [place];  Service: Gastroenterology;  Laterality: N/A;  To be deployed during EGD   Lower Ext. Duplex  03/12/2012   Right Proximal CIA- vessel narrowing w/elevated velocities 0-49% diameter reduction. Right SFA-mild mixed density plaque throughout vessel.   NM MYOCAR PERF WALL MOTION  05/19/2010   protocol: Persantine, post stress EF 65%, negative for ischemia, low risk scan   RIGHT HEART CATH N/A 06/27/2018   Procedure: RIGHT HEART CATH;  Surgeon: [name], MD;  Location: [place] [facility];  Service: Cardiovascular;  Laterality: N/A;   TOTAL ABDOMINAL HYSTERECTOMY W/ BILATERAL SALPINGOOPHORECTOMY  1989   TRANSTHORACIC ECHOCARDIOGRAM  08/29/2012   Moderately calcified annulus of mitral valve, moderate regurg.  of both mitral valve and tricuspid valve.      reports that she has quit smoking. Her smoking use included cigarettes. She has a 30.00 pack-year smoking history. She has never used smokeless tobacco. She reports that she does not drink alcohol and does not use drugs.  Allergies  Allergen Reactions   Flagyl [Metronidazole] Rash and Other (See Comments)    ALL-OVER BODY RASH   Augmentin [Amoxicillin-Pot Clavulanate] Diarrhea and Nausea And Vomiting   Ciprofloxacin Nausea Only and Other (See Comments)    Stomach ache   Coreg [Carvedilol] Other (See Comments)    Terrible cramping in the feet and had a lot of bowel movements, but not diarrhea   Diflucan [Fluconazole] Diarrhea   Losartan Swelling and Other (See Comments)    Patient doesn't recall site of swelling   Privigen [Immune Globulin (Human)] Other (See Comments)    Severe/excruciating pain   Sulfamethoxazole-Trimethoprim Nausea Only and Other (See Comments)    Stomach upset   Verapamil Hives   Zetia [Ezetimibe] Other (See Comments)    Reaction not recalled   Zocor [Simvastatin - High Dose] Other (See Comments)    Reaction not recalled   Gatifloxacin Rash and Other (See Comments)    Redness to skin around eye    Family History  Problem Relation Age of Onset   Heart disease Father    Hypertension Father    Hyperlipidemia Father    Breast cancer Neg Hx  Prior to Admission medications   Medication Sig Start Date End Date Taking? Authorizing Provider  acetaminophen (TYLENOL) 325 MG tablet Take 650 mg by mouth every 6 (six) hours as needed for mild pain or fever.    [provider]  alendronate (FOSAMAX) 70 MG tablet Take 70 mg by mouth every Saturday. 12/06/21   [provider]  allopurinol (ZYLOPRIM) 100 MG tablet Take 100 mg by mouth daily.    [provider]  ALPRAZolam Duanne Moron) 0.5 MG tablet Take 0.25-0.5 mg by mouth at bedtime. 07/27/21   [provider]  amLODipine (NORVASC) 10 MG  tablet TAKE 1 TABLET EVERY DAY Patient taking differently: Take 10 mg by mouth daily. 05/17/22   [name], MD  aspirin EC 81 MG tablet Take 81 mg by mouth daily. Swallow whole.    [provider]  atorvastatin (LIPITOR) 80 MG tablet Take 1 tablet (80 mg total) by mouth daily at 6 PM. 11/22/18   [name], MD  Blood Glucose Monitoring Suppl (TRUE METRIX METER) w/Device KIT  03/18/21   [provider]  Cholecalciferol (VITAMIN D3) 50 MCG (2000 UT) capsule Take 2,000 Units by mouth daily with lunch. 11/26/18   [provider]  cholestyramine Lucrezia Starch) 4 GM/DOSE powder Take 4 g by mouth daily after supper. 06/07/22   [provider]  cloNIDine (CATAPRES) 0.2 MG tablet TAKE 1 TABLET TWICE DAILY Patient taking differently: Take 0.2 mg by mouth 2 (two) times daily. 06/22/21   [name], MD  Cyanocobalamin (VITAMIN B12) 1000 MCG TBCR Take 1,000 mcg by mouth daily with lunch.    [provider]  estradiol (ESTRACE) 0.1 MG/GM vaginal cream Place 0.5g nightly for two weeks then twice a week after Patient taking differently: Place 1 Applicatorful vaginally once a week. 09/19/21   Jaquita Folds, MD  famotidine (PEPCID) 20 MG tablet Take 1 tablet (20 mg total) by mouth 2 (two) times daily. 04/10/22   Benay Pike, MD  folic acid (FOLVITE) 1 MG tablet Take 1 mg by mouth daily.    [provider]  furosemide (LASIX) 40 MG tablet TAKE 2 TABLETS (80 MG TOTAL) IN MORNING AND 1 AND 1/2 TABLETS (60 MG TOTAL) IN EVENING. CHANGE IN DOSAGE. Patient taking differently: Take 40-80 mg by mouth See admin instructions. Take 80 mg by mouth in the morning and 40 mg in the afternoon 03/22/22   Clegg, Amy D, NP  glucose blood (TRUE METRIX BLOOD GLUCOSE TEST) test strip Use 1-2 times daily    [provider]  hydrALAZINE (APRESOLINE) 100 MG tablet TAKE 1 TABLET THREE TIMES DAILY Patient taking differently: Take 100 mg by mouth 3 (three) times  daily. 02/17/22   [name], MD  insulin aspart (NOVOLOG) 100 UNIT/ML injection Take 2 units for blood sugar 200-250, 4 units for blood sugar 252-300, 6 units for blood sugar 301-350, 8 units for blood sugar greater than 350 and call MD. Patient taking differently: Inject 2-8 Units into the skin See admin instructions. Inject 2-8 units into the skin three times a day with meals, PER SLIDING SCALE:  2 units for blood sugar 200-250, 4 units for blood sugar 252-300, 6 units for blood sugar 301-350, 8 units for blood sugar greater than 350 and call MD. 02/23/22   Gardenia Phlegm, NP  Insulin Syringe-Needle U-100 (B-D INS SYR ULTRAFINE .3CC/29G) 29G X 1/2" 0.3 ML MISC Use for sliding scale for breakthrough high blood sugar readings 02/24/22  Benay Pike, MD  lactose free nutrition (BOOST) LIQD Take 237 mLs by mouth daily with supper. (To supplement meals)    [provider]  lidocaine (XYLOCAINE) 2 % solution Use as directed 15 mLs in the mouth or throat as needed for mouth pain. Patient taking differently: Use as directed 15 mLs in the mouth or throat daily as needed for mouth pain. 01/30/22   Benay Pike, MD  metoprolol succinate (TOPROL-XL) 25 MG 24 hr tablet TAKE 1/2 TABLET EVERY DAY Patient taking differently: Take 12.5 mg by mouth daily. 07/19/21   [name], MD  mirabegron ER (MYRBETRIQ) 25 MG TB24 tablet Take 1 tablet (25 mg total) by mouth daily. Patient not taking: Reported on 06/22/2022 04/17/22   Daleen Bo, MD  nystatin-alum & mag hydroxide-simeth-diphenhydrAMINE Swish 5 mLs by mouth 3 times daily then spit 05/15/22   Sherol Dade E, PA-C  ondansetron (ZOFRAN-ODT) 4 MG disintegrating tablet Take 1 tablet (4 mg total) by mouth every 8 (eight) hours as needed for vomiting or nausea. 05/15/22   Walisiewicz, [name], PA-C  predniSONE (DELTASONE) 10 MG tablet Take 2 tablets (20 mg total) by mouth daily with breakfast. 06/16/22 07/16/22  Benay Pike, MD   saccharomyces boulardii (FLORASTOR) 250 MG capsule Take 250 mg by mouth 2 (two) times daily.    [provider]  simethicone (GAS-X) 80 MG chewable tablet Chew 1 tablet (80 mg total) by mouth 4 (four) times daily as needed for flatulence. 04/16/22 04/16/23  Danford, Suann Larry, MD  sodium bicarbonate 650 MG tablet Take 1 tablet (650 mg total) by mouth 2 (two) times daily. 06/25/22 09/23/22  [place] Donnamarie Poag, DO  spironolactone (ALDACTONE) 25 MG tablet TAKE 1 TABLET EVERY DAY Patient taking differently: Take 25 mg by mouth daily. 05/17/22   [name], MD  tadalafil, PAH, (ADCIRCA) 20 MG tablet Take 2 tablets (40 mg total) by mouth daily. Patient taking differently: Take 40 mg by mouth at bedtime. 12/06/21   [name], MD  TRESIBA FLEXTOUCH 100 UNIT/ML FlexTouch Pen Inject 9-14 Units into the skin at bedtime. 02/20/22   [provider]  TRUE METRIX BLOOD GLUCOSE TEST test strip SMARTSIG:Via Meter 04/08/21   [provider]  vitamin C (ASCORBIC ACID) 500 MG tablet Take 500 mg by mouth daily with lunch.    [provider]  warfarin (COUMADIN) 5 MG tablet TAKE 1 TO 1 AND 1/2 TABLETS DAILY AS DIRECTED BY COUMADIN CLINIC Patient taking differently: Take 5-7.5 mg by mouth See admin instructions. TAKE 1 TO 1 AND 1/2 TABLETS  ( 7.5 mg) on Sunday & Friday all other days taking 5 mg each day. 05/01/22   Lorretta Harp, MD    Physical Exam: Vitals:   07/08/22 1918 07/08/22 2041 07/08/22 2200 07/08/22 2230  BP: (!) 133/50 (!) 145/45 (!) 141/44 (!) 127/40  Pulse: 89 94 91 88  Resp: 16 (!) '24 17 17  ' Temp: 97.6 F (36.4 C)     TempSrc: Oral     SpO2: 100% 100% 100% 100%    Physical Exam Vitals and nursing note reviewed.  Constitutional:      Appearance: She is ill-appearing. She is not toxic-appearing or diaphoretic.     Comments: Chronically ill appearing female  HENT:     Head: Normocephalic and atraumatic.  Eyes:     General: No scleral  icterus. Cardiovascular:     Rate and Rhythm: Normal rate. Rhythm irregular.     Heart sounds:  Murmur heard.  Pulmonary:     Effort: Pulmonary effort is normal.  Abdominal:     General: Bowel sounds are normal. There is no distension.     Tenderness: There is no abdominal tenderness. There is no guarding or rebound.  Musculoskeletal:     Right lower leg: Edema present.     Left lower leg: Edema present.  Skin:    General: Skin is warm.     Capillary Refill: Capillary refill takes less than 2 seconds.     Coloration: Skin is pale.     Comments: Multiple areas of ecchymosis abd, arms, legs  Neurological:     Mental Status: She is alert and oriented to person, place, and time.      Labs on Admission: I have personally reviewed following labs and imaging studies  CBC: Recent Labs  Lab 07/07/22 1206 07/08/22 1932  WBC 7.1 6.5  NEUTROABS 6.5  --   HGB 8.7* 5.6*  HCT 25.7* 17.6*  MCV 91.1 96.7  PLT 136* 557*   Basic Metabolic Panel: Recent Labs  Lab 07/07/22 1206 07/08/22 1932  NA 133* 132*  K 4.6 5.2*  CL 101 102  CO2 25 19*  GLUCOSE 167* 354*  BUN 82* 138*  CREATININE 2.21* 2.77*  CALCIUM 8.4* 7.9*   GFR: Estimated Creatinine Clearance: 12.7 mL/min (A) (by C-G formula based on SCr of 2.77 mg/dL (H)). Liver Function Tests: Recent Labs  Lab 07/07/22 1206 07/08/22 1932  AST 25 23  ALT 15 16  ALKPHOS 71 53  BILITOT 0.6 0.7  PROT 5.3* 4.3*  ALBUMIN 3.3* 2.5*   Recent Labs  Lab 07/08/22 1932  LIPASE 51   No results for input(s): "AMMONIA" in the last 168 hours. Coagulation Profile: Recent Labs  Lab 07/07/22 1206 07/08/22 2207  INR 5.4* 5.4*   Cardiac Enzymes: No results for input(s): "CKTOTAL", "CKMB", "CKMBINDEX", "TROPONINI", "TROPONINIHS" in the last 168 hours. BNP (last 3 results) No results for input(s): "PROBNP" in the last 8760 hours. HbA1C: No results for input(s): "HGBA1C" in the last 72 hours. CBG: No results for input(s): "GLUCAP" in  the last 168 hours. Lipid Profile: No results for input(s): "CHOL", "HDL", "LDLCALC", "TRIG", "CHOLHDL", "LDLDIRECT" in the last 72 hours. Thyroid Function Tests: No results for input(s): "TSH", "T4TOTAL", "FREET4", "T3FREE", "THYROIDAB" in the last 72 hours. Anemia Panel: Recent Labs    07/07/22 1207  RETICCTPCT 2.6   Urine analysis:    Component Value Date/Time   COLORURINE YELLOW 06/22/2022 1030   APPEARANCEUR TURBID (A) 06/22/2022 1030   LABSPEC 1.010 06/22/2022 1030   PHURINE 5.0 06/22/2022 1030   GLUCOSEU NEGATIVE 06/22/2022 1030   HGBUR LARGE (A) 06/22/2022 1030   BILIRUBINUR NEGATIVE 06/22/2022 1030   BILIRUBINUR Negative 09/07/2021 1054   KETONESUR NEGATIVE 06/22/2022 1030   PROTEINUR 100 (A) 06/22/2022 1030   UROBILINOGEN 0.2 09/07/2021 1054   UROBILINOGEN 1.0 10/06/2011 1709   NITRITE NEGATIVE 06/22/2022 1030   LEUKOCYTESUR LARGE (A) 06/22/2022 1030    Radiological Exams on Admission: I have personally reviewed images CT ABDOMEN PELVIS WO CONTRAST  Result Date: 07/08/2022 CLINICAL DATA:  Abdominal pain, diarrhea, black stools EXAM: CT ABDOMEN AND PELVIS WITHOUT CONTRAST TECHNIQUE: Multidetector CT imaging of the abdomen and pelvis was performed following the standard protocol without IV contrast. RADIATION DOSE REDUCTION: This exam was performed according to the departmental dose-optimization program which includes automated exposure control, adjustment of the mA and/or kV according to patient size and/or use of iterative reconstruction technique.  COMPARISON:  CT abdomen and pelvis 06/22/2022 FINDINGS: Lower chest: No acute abnormality. Hepatobiliary: No focal liver abnormality is seen. Cholecystectomy. No biliary dilation. Pancreas: Small amount of stranding about the head of the pancreas is favored due to duodenitis/duodenal diverticulitis as the majority of the fluid is centered around the duodenum. Spleen: Normal in size without focal abnormality. Adrenals/Urinary  Tract: Adrenal glands are unremarkable. Kidneys are normal, without renal calculi, focal lesion, or hydronephrosis. Bladder is unremarkable. Stomach/Bowel: Duodenal diverticulum. Marked wall thickening about the second and third portions of the duodenum with adjacent hazy mesenteric fat stranding. Free fluid about the duodenum tracks inferiorly in the right anterior pararenal fascia. No free intraperitoneal air. Marked colonic diverticulosis. There is mild wall thickening about the ascending colon and cecum. Vascular/Lymphatic: Aortic atherosclerosis. No enlarged abdominal or pelvic lymph nodes. Reproductive: Status post hysterectomy. No adnexal masses. Other: Increased nonspecific subcutaneous stranding in the lower anterior abdominal subcutaneous fat. Subcutaneous gas in the anterior abdominal wall favored due to injections. Edema within the lateral thighs bilaterally. Musculoskeletal: Advanced degenerative arthritis right hip. Unchanged superior endplate compression of L4. No acute osseous abnormality. IMPRESSION: Marked duodenitis/duodenal diverticulitis. No evidence of perforation. Question additional mild diverticulitis of the ascending colon and cecum. Nonspecific hazy subcutaneous fat stranding within the anterior abdominal wall and bilateral thighs. Small amount of subcutaneous gas in the right anterior abdomen favored to be due to injections. Clinical correlation recommended. Aortic Atherosclerosis (ICD10-I70.0). Electronically Signed   By: Placido Sou M.D.   On: 07/08/2022 20:30    EKG: My personal interpretation of EKG shows: afib    Assessment/Plan Principal Problem:   GI bleeding Active Problems:   Supratherapeutic INR   Diverticulitis   Acute on chronic anemia   Chronic anticoagulation   Chronic diastolic CHF (congestive heart failure) (HCC)   Diabetes mellitus type 2, controlled ([place])   H/O mechanical aortic valve replacement   PAF (paroxysmal atrial fibrillation) (HCC)    Vasculitis, ANCA positive   CKD (chronic kidney disease), stage IV (HCC)   Autoimmune hemolytic anemia (HCC)   Bilateral lower extremity edema   History of Clostridioides difficile colitis   DNR (do not resuscitate)/DNI(Do Not Intubate)    Assessment and Plan: * GI bleeding Admit to SDU. Transfuse with 2 units PRBC. IV lasix 40 mg after 1st unit PRBC. Keep NPO. protonix gtts.  Acute on chronic anemia Has multifactorial reasons for her anemia including hemolytic anemia, anemia of CKD, GI bleeding. Will transfuse.  Diverticulitis Seen on CT abd. IV zosyn.   Supratherapeutic INR INR 5.4. likely due to poor po vitamin K intake. EDP gave 1 mg IV vitamin K. Pt has mechanical aortic valve. Hx of diastolic CHF. Pt is not actively hemorrhaging. Will hold off on Kcentra for now. Hold off on FFP due to concerns about intravascular volume issues. Difficult situation trying to balance anticoagulation needed for mechanical aortic valve and GI bleeding issues.  DNR (do not resuscitate)/DNI(Do Not Intubate) Verified with pt that she is DNR/DNI.  History of Clostridioides difficile colitis Start prophylactic po vancomycin due to prior hx of c. Diff.  Bilateral lower extremity edema Chronic. Unable to diurese much due to her CKD stage 4.  Autoimmune hemolytic anemia (HCC) Continue prednisone 20 mg daily. Read heme/onc office notes. Attempts at weaning pt down from 20 mg daily prednisone were unsuccessful. Given her GI bleeding issues and now difficult to control INR, pt may need hospice care. Will consult palliative care to address [place].  CKD (chronic kidney disease),  stage IV (HCC) Acutely worse. Baseline Scr 2.2-2.4. hopefully PRBC transfusion will help with renal perfusion.  Vasculitis, ANCA positive Continue with prednisone 20 mg daily.  PAF (paroxysmal atrial fibrillation) (HCC) EKG shows afib. Rate controlled.  H/O mechanical aortic valve replacement Chronic.  Diabetes mellitus type  2, controlled ([place]) Add SSI.  Chronic diastolic CHF (congestive heart failure) (HCC) Not volume overloaded at this point. Will need to re-assess after PRBC transfusion.  Chronic anticoagulation On coumadin at home for mechanical aortic valve.   DVT prophylaxis: SCDs Code Status: DNR/DNI(Do NOT Intubate) Family Communication: no family at bedside  Disposition Plan: home vs SNF  Consults called: will need GI and hematology consult in AM  Admission status: Inpatient, Step Down Unit   Kristopher Oppenheim, DO [facility] 07/08/2022, 11:50 PM

## 2022-07-08 NOTE — Assessment & Plan Note (Signed)
Verified with pt that she is DNR/DNI.

## 2022-07-08 NOTE — Assessment & Plan Note (Signed)
Seen on CT abd. IV zosyn.

## 2022-07-08 NOTE — Progress Notes (Signed)
PHARMACY -  BRIEF ANTIBIOTIC NOTE   Pharmacy has received consult(s) for Zosyn from an ED provider.  [name] profile has been reviewed for ht/wt/allergies/indication/available labs.    One time order(s) placed for Zosyn 3.375 g  Further antibiotics/pharmacy consults should be ordered by admitting physician if indicated.                       Thank you,  Tawnya Crook, PharmD, BCPS Clinical Pharmacist 07/08/2022 8:41 PM

## 2022-07-08 NOTE — Assessment & Plan Note (Signed)
Continue with prednisone 20 mg daily.

## 2022-07-08 NOTE — ED Triage Notes (Addendum)
Patient arrived via EMS for abdominal pain and diarrhea. History of diverticulitis. Patient took imodium at home and zofran this morning. Patient ws recently discharged from the hospital. Takes warfarin for Afib. Patient reports black stools, but also recently started taking iron.

## 2022-07-08 NOTE — Assessment & Plan Note (Signed)
Start prophylactic po vancomycin due to prior hx of c. Diff.

## 2022-07-08 NOTE — Assessment & Plan Note (Signed)
Add SSI. ?

## 2022-07-08 NOTE — Progress Notes (Signed)
Pharmacy Antibiotic Note  [name] is a 81 y.o. female admitted on 07/08/2022 with GI bleeding, diverticulitis.  Pharmacy has been consulted for Zosyn dosing.  Plan: Zosyn 2.25gm IV q8h Follow renal function     Temp (24hrs), Avg:97.6 F (36.4 C), Min:97.6 F (36.4 C), Max:97.6 F (36.4 C)  Recent Labs  Lab 07/07/22 1206 07/08/22 1932 07/08/22 2215  WBC 7.1 6.5  --   CREATININE 2.21* 2.77*  --   LATICACIDVEN  --   --  2.7*    Estimated Creatinine Clearance: 12.7 mL/min (A) (by C-G formula based on SCr of 2.77 mg/dL (H)).    Allergies  Allergen Reactions   Flagyl [Metronidazole] Rash and Other (See Comments)    ALL-OVER BODY RASH   Augmentin [Amoxicillin-Pot Clavulanate] Diarrhea and Nausea And Vomiting   Ciprofloxacin Nausea Only and Other (See Comments)    Stomach ache   Coreg [Carvedilol] Other (See Comments)    Terrible cramping in the feet and had a lot of bowel movements, but not diarrhea   Diflucan [Fluconazole] Diarrhea   Losartan Swelling and Other (See Comments)    Patient doesn't recall site of swelling   Privigen [Immune Globulin (Human)] Other (See Comments)    Severe/excruciating pain   Sulfamethoxazole-Trimethoprim Nausea Only and Other (See Comments)    Stomach upset   Verapamil Hives   Zetia [Ezetimibe] Other (See Comments)    Reaction not recalled   Zocor [Simvastatin - High Dose] Other (See Comments)    Reaction not recalled   Gatifloxacin Rash and Other (See Comments)    Redness to skin around eye    Antimicrobials this admission: 9/30 Zosyn >>      Dose adjustments this admission:    Microbiology results:    Thank you for allowing pharmacy to be a part of this patient's care.  Everette Rank, PharmD 07/08/2022 11:56 PM

## 2022-07-08 NOTE — Assessment & Plan Note (Signed)
INR 5.4. likely due to poor po vitamin K intake. EDP gave 1 mg IV vitamin K. Pt has mechanical aortic valve. Hx of diastolic CHF. Pt is not actively hemorrhaging. Will hold off on Kcentra for now. Hold off on FFP due to concerns about intravascular volume issues. Difficult situation trying to balance anticoagulation needed for mechanical aortic valve and GI bleeding issues.

## 2022-07-08 NOTE — Assessment & Plan Note (Addendum)
Acutely worse. Baseline Scr 2.2-2.4. hopefully PRBC transfusion will help with renal perfusion.

## 2022-07-08 NOTE — Assessment & Plan Note (Signed)
Chronic. 

## 2022-07-08 NOTE — ED Notes (Signed)
Unsuccessful [place] PIV attempt x2 in L FA.

## 2022-07-08 NOTE — Assessment & Plan Note (Signed)
Has multifactorial reasons for her anemia including hemolytic anemia, anemia of CKD, GI bleeding. Will transfuse.

## 2022-07-08 NOTE — Assessment & Plan Note (Signed)
Continue prednisone 20 mg daily. Read heme/onc office notes. Attempts at weaning pt down from 20 mg daily prednisone were unsuccessful. Given her GI bleeding issues and now difficult to control INR, pt may need hospice care. Will consult palliative care to address [place].

## 2022-07-08 NOTE — Assessment & Plan Note (Signed)
On coumadin at home for mechanical aortic valve.

## 2022-07-08 NOTE — ED Provider Notes (Addendum)
[facility] DEPT Provider Note   CSN: [id number] Arrival date & time: 07/08/22  1859     History  Chief Complaint  Patient presents with   Abdominal Pain    [name] is a 81 y.o. female.  81 year old female with prior medical history as detailed below presents for evaluation.  Patient is complaining of generalized abdominal crampy discomfort and also dark loose diarrhea for the last 2 days.  Patient is on Coumadin.  Patient's INR yesterday was 5.  Patient with history of mechanical aortic valve replacement and atrial fibrillation.  Patient with history of C. difficile.  She reports that she stopped taking oral vancomycin earlier this week.  Patient is on chronic prednisone.  Patient previously seen by Dr. Alessandra Bevels with [facility].  The history is provided by the patient and medical records.       Home Medications Prior to Admission medications   Medication Sig Start Date End Date Taking? Authorizing Provider  acetaminophen (TYLENOL) 325 MG tablet Take 650 mg by mouth every 6 (six) hours as needed for mild pain or fever.    [provider]  alendronate (FOSAMAX) 70 MG tablet Take 70 mg by mouth every Saturday. 12/06/21   [provider]  allopurinol (ZYLOPRIM) 100 MG tablet Take 100 mg by mouth daily.    [provider]  ALPRAZolam Duanne Moron) 0.5 MG tablet Take 0.25-0.5 mg by mouth at bedtime. 07/27/21   [provider]  amLODipine (NORVASC) 10 MG tablet TAKE 1 TABLET EVERY DAY Patient taking differently: Take 10 mg by mouth daily. 05/17/22   [name], MD  aspirin EC 81 MG tablet Take 81 mg by mouth daily. Swallow whole.    [provider]  atorvastatin (LIPITOR) 80 MG tablet Take 1 tablet (80 mg total) by mouth daily at 6 PM. 11/22/18   [name], MD  Blood Glucose Monitoring Suppl (TRUE METRIX METER) w/Device KIT  03/18/21   [provider]  Cholecalciferol (VITAMIN D3) 50 MCG  (2000 UT) capsule Take 2,000 Units by mouth daily with lunch. 11/26/18   [provider]  cholestyramine Lucrezia Starch) 4 GM/DOSE powder Take 4 g by mouth daily after supper. 06/07/22   [provider]  cloNIDine (CATAPRES) 0.2 MG tablet TAKE 1 TABLET TWICE DAILY Patient taking differently: Take 0.2 mg by mouth 2 (two) times daily. 06/22/21   [name], MD  Cyanocobalamin (VITAMIN B12) 1000 MCG TBCR Take 1,000 mcg by mouth daily with lunch.    [provider]  estradiol (ESTRACE) 0.1 MG/GM vaginal cream Place 0.5g nightly for two weeks then twice a week after Patient taking differently: Place 1 Applicatorful vaginally once a week. 09/19/21   Jaquita Folds, MD  famotidine (PEPCID) 20 MG tablet Take 1 tablet (20 mg total) by mouth 2 (two) times daily. 04/10/22   Benay Pike, MD  folic acid (FOLVITE) 1 MG tablet Take 1 mg by mouth daily.    [provider]  furosemide (LASIX) 40 MG tablet TAKE 2 TABLETS (80 MG TOTAL) IN MORNING AND 1 AND 1/2 TABLETS (60 MG TOTAL) IN EVENING. CHANGE IN DOSAGE. Patient taking differently: Take 40-80 mg by mouth See admin instructions. Take 80 mg by mouth in the morning and 40 mg in the afternoon 03/22/22   Clegg, Amy D, NP  glucose blood (TRUE METRIX BLOOD GLUCOSE TEST) test strip Use 1-2 times daily    [provider]  hydrALAZINE (APRESOLINE) 100 MG tablet TAKE  1 TABLET THREE TIMES DAILY Patient taking differently: Take 100 mg by mouth 3 (three) times daily. 02/17/22   [name], MD  insulin aspart (NOVOLOG) 100 UNIT/ML injection Take 2 units for blood sugar 200-250, 4 units for blood sugar 252-300, 6 units for blood sugar 301-350, 8 units for blood sugar greater than 350 and call MD. Patient taking differently: Inject 2-8 Units into the skin See admin instructions. Inject 2-8 units into the skin three times a day with meals, PER SLIDING SCALE:  2 units for blood sugar 200-250, 4 units for blood sugar 252-300, 6  units for blood sugar 301-350, 8 units for blood sugar greater than 350 and call MD. 02/23/22   Gardenia Phlegm, NP  Insulin Syringe-Needle U-100 (B-D INS SYR ULTRAFINE .3CC/29G) 29G X 1/2" 0.3 ML MISC Use for sliding scale for breakthrough high blood sugar readings 02/24/22   Benay Pike, MD  lactose free nutrition (BOOST) LIQD Take 237 mLs by mouth daily with supper. (To supplement meals)    [provider]  lidocaine (XYLOCAINE) 2 % solution Use as directed 15 mLs in the mouth or throat as needed for mouth pain. Patient taking differently: Use as directed 15 mLs in the mouth or throat daily as needed for mouth pain. 01/30/22   Benay Pike, MD  metoprolol succinate (TOPROL-XL) 25 MG 24 hr tablet TAKE 1/2 TABLET EVERY DAY Patient taking differently: Take 12.5 mg by mouth daily. 07/19/21   [name], MD  mirabegron ER (MYRBETRIQ) 25 MG TB24 tablet Take 1 tablet (25 mg total) by mouth daily. Patient not taking: Reported on 06/22/2022 04/17/22   Daleen Bo, MD  nystatin-alum & mag hydroxide-simeth-diphenhydrAMINE Swish 5 mLs by mouth 3 times daily then spit 05/15/22   Sherol Dade E, PA-C  ondansetron (ZOFRAN-ODT) 4 MG disintegrating tablet Take 1 tablet (4 mg total) by mouth every 8 (eight) hours as needed for vomiting or nausea. 05/15/22   Walisiewicz, [name], PA-C  predniSONE (DELTASONE) 10 MG tablet Take 2 tablets (20 mg total) by mouth daily with breakfast. 06/16/22 07/16/22  Benay Pike, MD  saccharomyces boulardii (FLORASTOR) 250 MG capsule Take 250 mg by mouth 2 (two) times daily.    [provider]  simethicone (GAS-X) 80 MG chewable tablet Chew 1 tablet (80 mg total) by mouth 4 (four) times daily as needed for flatulence. 04/16/22 04/16/23  Danford, Suann Larry, MD  sodium bicarbonate 650 MG tablet Take 1 tablet (650 mg total) by mouth 2 (two) times daily. 06/25/22 09/23/22  [place] Donnamarie Poag, DO  spironolactone (ALDACTONE) 25 MG tablet TAKE 1 TABLET  EVERY DAY Patient taking differently: Take 25 mg by mouth daily. 05/17/22   [name], MD  tadalafil, PAH, (ADCIRCA) 20 MG tablet Take 2 tablets (40 mg total) by mouth daily. Patient taking differently: Take 40 mg by mouth at bedtime. 12/06/21   [name], MD  TRESIBA FLEXTOUCH 100 UNIT/ML FlexTouch Pen Inject 9-14 Units into the skin at bedtime. 02/20/22   [provider]  TRUE METRIX BLOOD GLUCOSE TEST test strip SMARTSIG:Via Meter 04/08/21   [provider]  vitamin C (ASCORBIC ACID) 500 MG tablet Take 500 mg by mouth daily with lunch.    [provider]  warfarin (COUMADIN) 5 MG tablet TAKE 1 TO 1 AND 1/2 TABLETS DAILY AS DIRECTED BY COUMADIN CLINIC Patient taking differently: Take 5-7.5 mg by mouth See admin instructions. TAKE 1 TO 1 AND 1/2 TABLETS  ( 7.5 mg)  on Sunday & Friday all other days taking 5 mg each day. 05/01/22   Lorretta Harp, MD      Allergies    Flagyl [metronidazole], Augmentin [amoxicillin-pot clavulanate], Ciprofloxacin, Coreg [carvedilol], Diflucan [fluconazole], Losartan, Privigen [immune globulin (human)], Sulfamethoxazole-trimethoprim, Verapamil, Zetia [ezetimibe], Zocor [simvastatin - high dose], and Gatifloxacin    Review of Systems   Review of Systems  All other systems reviewed and are negative.   Physical Exam Updated Vital Signs BP (!) 141/44   Pulse 91   Temp 97.6 F (36.4 C) (Oral)   Resp 17   SpO2 100%  Physical Exam Vitals and nursing note reviewed.  Constitutional:      General: She is not in acute distress.    Appearance: Normal appearance. She is well-developed.  HENT:     Head: Normocephalic and atraumatic.  Eyes:     Conjunctiva/sclera: Conjunctivae normal.     Pupils: Pupils are equal, round, and reactive to light.     Comments: Pale conjunctiva  Cardiovascular:     Rate and Rhythm: Normal rate and regular rhythm.     Heart sounds: Normal heart sounds.  Pulmonary:     Effort: Pulmonary effort  is normal. No respiratory distress.     Breath sounds: Normal breath sounds.  Abdominal:     General: There is no distension.     Palpations: Abdomen is soft.     Tenderness: There is abdominal tenderness in the epigastric area.  Genitourinary:    Comments:  Melanotic stool that is guaiac positive Musculoskeletal:        General: No deformity. Normal range of motion.     Cervical back: Normal range of motion and neck supple.  Skin:    General: Skin is warm and dry.  Neurological:     General: No focal deficit present.     Mental Status: She is alert and oriented to person, place, and time.     ED Results / Procedures / Treatments   Labs (all labs ordered are listed, but only abnormal results are displayed) Labs Reviewed  COMPREHENSIVE METABOLIC PANEL - Abnormal; Notable for the following components:      Result Value   Sodium 132 (*)    Potassium 5.2 (*)    CO2 19 (*)    Glucose, Bld 354 (*)    BUN 138 (*)    Creatinine, Ser 2.77 (*)    Calcium 7.9 (*)    Total Protein 4.3 (*)    Albumin 2.5 (*)    GFR, Estimated 16 (*)    All other components within normal limits  CBC - Abnormal; Notable for the following components:   RBC 1.82 (*)    Hemoglobin 5.6 (*)    HCT 17.6 (*)    RDW 17.0 (*)    Platelets 148 (*)    nRBC 0.3 (*)    All other components within normal limits  POC OCCULT BLOOD, ED - Abnormal; Notable for the following components:   Fecal Occult Bld POSITIVE (*)    All other components within normal limits  C DIFFICILE QUICK SCREEN W PCR REFLEX    LIPASE, BLOOD  URINALYSIS, ROUTINE W REFLEX MICROSCOPIC  LACTIC ACID, PLASMA  LACTIC ACID, PLASMA  PROTIME-INR  TYPE AND SCREEN  PREPARE RBC (CROSSMATCH)    EKG EKG Interpretation  Date/Time:  Saturday July 08 2022 21:47:42 EDT Ventricular Rate:  95 PR Interval:    QRS Duration: 94 QT Interval:  344 QTC Calculation: 433 R  Axis:   97 Text Interpretation: Atrial fibrillation Right axis deviation  Minimal ST depression, lateral leads Confirmed by Dene Gentry [id number]) on 07/08/2022 9:58:26 PM  Radiology CT ABDOMEN PELVIS WO CONTRAST  Result Date: 07/08/2022 CLINICAL DATA:  Abdominal pain, diarrhea, black stools EXAM: CT ABDOMEN AND PELVIS WITHOUT CONTRAST TECHNIQUE: Multidetector CT imaging of the abdomen and pelvis was performed following the standard protocol without IV contrast. RADIATION DOSE REDUCTION: This exam was performed according to the departmental dose-optimization program which includes automated exposure control, adjustment of the mA and/or kV according to patient size and/or use of iterative reconstruction technique. COMPARISON:  CT abdomen and pelvis 06/22/2022 FINDINGS: Lower chest: No acute abnormality. Hepatobiliary: No focal liver abnormality is seen. Cholecystectomy. No biliary dilation. Pancreas: Small amount of stranding about the head of the pancreas is favored due to duodenitis/duodenal diverticulitis as the majority of the fluid is centered around the duodenum. Spleen: Normal in size without focal abnormality. Adrenals/Urinary Tract: Adrenal glands are unremarkable. Kidneys are normal, without renal calculi, focal lesion, or hydronephrosis. Bladder is unremarkable. Stomach/Bowel: Duodenal diverticulum. Marked wall thickening about the second and third portions of the duodenum with adjacent hazy mesenteric fat stranding. Free fluid about the duodenum tracks inferiorly in the right anterior pararenal fascia. No free intraperitoneal air. Marked colonic diverticulosis. There is mild wall thickening about the ascending colon and cecum. Vascular/Lymphatic: Aortic atherosclerosis. No enlarged abdominal or pelvic lymph nodes. Reproductive: Status post hysterectomy. No adnexal masses. Other: Increased nonspecific subcutaneous stranding in the lower anterior abdominal subcutaneous fat. Subcutaneous gas in the anterior abdominal wall favored due to injections. Edema within the lateral  thighs bilaterally. Musculoskeletal: Advanced degenerative arthritis right hip. Unchanged superior endplate compression of L4. No acute osseous abnormality. IMPRESSION: Marked duodenitis/duodenal diverticulitis. No evidence of perforation. Question additional mild diverticulitis of the ascending colon and cecum. Nonspecific hazy subcutaneous fat stranding within the anterior abdominal wall and bilateral thighs. Small amount of subcutaneous gas in the right anterior abdomen favored to be due to injections. Clinical correlation recommended. Aortic Atherosclerosis (ICD10-I70.0). Electronically Signed   By: Placido Sou M.D.   On: 07/08/2022 20:30    Procedures Procedures    Medications Ordered in ED Medications  pantoprazole (PROTONIX) 80 mg /NS 100 mL IVPB (80 mg Intravenous New Bag/Given 07/08/22 2239)  ondansetron (ZOFRAN-ODT) disintegrating tablet 4 mg (4 mg Oral Given 07/08/22 1926)  HYDROmorphone (DILAUDID) injection 0.5 mg (0.5 mg Intravenous Given 07/08/22 2152)  piperacillin-tazobactam (ZOSYN) IVPB 3.375 g (3.375 g Intravenous New Bag/Given 07/08/22 2157)  0.9 %  sodium chloride infusion (10 mL/hr Intravenous New Bag/Given 07/08/22 2155)    ED Course/ Medical Decision Making/ A&P Clinical Course as of 07/08/22 2243  Sat Jul 08, 2022  2001 Hemoglobin(!!): 5.6 Noted to have hgb of 5.6 while on Warfarin from baseline of 8.0. Insetting of dark stools, I have requested that patient get a room soon [GL]    Clinical Course User Index [GL] Loeffler, Adora Fridge, PA-C                           Medical Decision Making Amount and/or Complexity of Data Reviewed Labs: ordered.  Risk Prescription drug management. Decision regarding hospitalization.    Medical Screen Complete  This patient presented to the ED with complaint of diarrhea, concern for GI bleed.  This complaint involves an extensive number of treatment options. The initial differential diagnosis includes, but is not limited to,  bleeding, other intra-abdominal pathology, coagulopathy,  metabolic abnormality, etc.  This presentation is: Acute, Chronic, Self-Limited, Previously Undiagnosed, Uncertain Prognosis, Complicated, Systemic Symptoms, and Threat to Life/Bodily Function  Patient with multiple comorbidities including chronic anemia, mechanical aortic valve replacement, A-fib, on Coumadin, vasculitis, chronically immunosuppressed, CKD, and history of C. difficile presents with complaint of likely melanotic stool.  Patient is guaiac positive on exam.  Hemoglobin today has dropped to 5.6, INR obtained yesterday was 5.  Patient has not taken her Coumadin since Thursday.  Patient with worsening renal function with a creatinine today of 2.7 with a BUN of 138.  CT imaging is concerning for possible duodenal diverticulum and diverticulitis.  Zosyn initiated for treatment of possible intra-abdominal infection.  Patient will require admission.  INR today 5.4, Vit K ordered.  Case discussed briefly with Dr. Ruthann Cancer covering critical care.  She feels the patient would be most appropriate on hospitalist service at this time.  Epic message sent to Dr Randel Pigg, covering [facility] requesting consult. Patient known to Dr. Alessandra Bevels.  Hospitalist (DR. Bridgett Larsson) service made aware of case and will evaluate for admission.  Co morbidities that complicated the patient's evaluation  History of aortic valve replacement on Coumadin, anemia, vasculitis on prednisone, CKD, history of C. difficile   Additional history obtained: External records from outside sources obtained and reviewed including prior ED visits and prior Inpatient records.    Lab Tests:  I ordered and personally interpreted labs.  The pertinent results include: CBC, CMP, INR, lactic acid, Hemoccult, lipase, C. difficile   Imaging Studies ordered:  I ordered imaging studies including CT abdomen pelvis I independently visualized and interpreted obtained imaging  which showed duodenal inflammation and/or duodenitis I agree with the radiologist interpretation.   Cardiac Monitoring:  The patient was maintained on a cardiac monitor.  I personally viewed and interpreted the cardiac monitor which showed an underlying rhythm of: NSR   Medicines ordered:  I ordered medication including  Protonix, Zosyn, vitamin K, PRBCs for intra-abdominal infection, GI bleed. Reevaluation of the patient after these medicines showed that the patient: improved    Problem List / ED Course:  GI bleed, anemia, duodenitis   Reevaluation:  After the interventions noted above, I reevaluated the patient and found that they have: They have the same   Disposition:  After consideration of the diagnostic results and the patients response to treatment, I feel that the patent would benefit from mission.    CRITICAL CARE Performed by: Valarie Merino   Total critical care time: 30 minutes  Critical care time was exclusive of separately billable procedures and treating other patients.  Critical care was necessary to treat or prevent imminent or life-threatening deterioration.  Critical care was time spent personally by me on the following activities: development of treatment plan with patient and/or surrogate as well as nursing, discussions with consultants, evaluation of patient's response to treatment, examination of patient, obtaining history from patient or surrogate, ordering and performing treatments and interventions, ordering and review of laboratory studies, ordering and review of radiographic studies, pulse oximetry and re-evaluation of patient's condition.         Final Clinical Impression(s) / ED Diagnoses Final diagnoses:  Gastrointestinal hemorrhage, unspecified gastrointestinal hemorrhage type  Anemia, unspecified type  Coagulopathy [facility])    Rx / DC Orders ED Discharge Orders     None         Valarie Merino, MD 07/08/22 2302     Valarie Merino, MD 07/08/22 [phone]

## 2022-07-08 NOTE — Assessment & Plan Note (Addendum)
Admit to SDU. Transfuse with 2 units PRBC. IV lasix 40 mg after 1st unit PRBC. Keep NPO. protonix gtts.

## 2022-07-08 NOTE — Assessment & Plan Note (Signed)
Not volume overloaded at this point. Will need to re-assess after PRBC transfusion.

## 2022-07-09 DIAGNOSIS — K922 Gastrointestinal hemorrhage, unspecified: Secondary | ICD-10-CM | POA: Diagnosis present

## 2022-07-09 DIAGNOSIS — N181 Chronic kidney disease, stage 1: Secondary | ICD-10-CM

## 2022-07-09 DIAGNOSIS — D649 Anemia, unspecified: Secondary | ICD-10-CM | POA: Diagnosis not present

## 2022-07-09 DIAGNOSIS — E1122 Type 2 diabetes mellitus with diabetic chronic kidney disease: Secondary | ICD-10-CM

## 2022-07-09 DIAGNOSIS — R600 Localized edema: Secondary | ICD-10-CM | POA: Diagnosis not present

## 2022-07-09 DIAGNOSIS — Z794 Long term (current) use of insulin: Secondary | ICD-10-CM

## 2022-07-09 DIAGNOSIS — D591 Autoimmune hemolytic anemia, unspecified: Secondary | ICD-10-CM | POA: Diagnosis not present

## 2022-07-09 DIAGNOSIS — K921 Melena: Secondary | ICD-10-CM

## 2022-07-09 LAB — COMPREHENSIVE METABOLIC PANEL
ALT: 16 U/L (ref 0–44)
AST: 22 U/L (ref 15–41)
Albumin: 2.4 g/dL — ABNORMAL LOW (ref 3.5–5.0)
Alkaline Phosphatase: 49 U/L (ref 38–126)
Anion gap: 10 (ref 5–15)
BUN: 139 mg/dL — ABNORMAL HIGH (ref 8–23)
CO2: 18 mmol/L — ABNORMAL LOW (ref 22–32)
Calcium: 7.6 mg/dL — ABNORMAL LOW (ref 8.9–10.3)
Chloride: 107 mmol/L (ref 98–111)
Creatinine, Ser: 2.69 mg/dL — ABNORMAL HIGH (ref 0.44–1.00)
GFR, Estimated: 17 mL/min — ABNORMAL LOW (ref >60)
Glucose, Bld: 321 mg/dL — ABNORMAL HIGH (ref 70–99)
Potassium: 4.6 mmol/L (ref 3.5–5.1)
Sodium: 135 mmol/L (ref 135–145)
Total Bilirubin: 0.7 mg/dL (ref 0.3–1.2)
Total Protein: 4.1 g/dL — ABNORMAL LOW (ref 6.5–8.1)

## 2022-07-09 LAB — CBC WITH DIFFERENTIAL/PLATELET
Abs Immature Granulocytes: 0.18 10*3/uL — ABNORMAL HIGH (ref 0.00–0.07)
Basophils Absolute: 0 10*3/uL (ref 0.0–0.1)
Basophils Relative: 0 %
Eosinophils Absolute: 0 10*3/uL (ref 0.0–0.5)
Eosinophils Relative: 0 %
HCT: 21.1 % — ABNORMAL LOW (ref 36.0–46.0)
Hemoglobin: 7.1 g/dL — ABNORMAL LOW (ref 12.0–15.0)
Immature Granulocytes: 2 %
Lymphocytes Relative: 12 %
Lymphs Abs: 1.2 10*3/uL (ref 0.7–4.0)
MCH: 30.2 pg (ref 26.0–34.0)
MCHC: 33.6 g/dL (ref 30.0–36.0)
MCV: 89.8 fL (ref 80.0–100.0)
Monocytes Absolute: 0.8 10*3/uL (ref 0.1–1.0)
Monocytes Relative: 7 %
Neutro Abs: 8 10*3/uL — ABNORMAL HIGH (ref 1.7–7.7)
Neutrophils Relative %: 79 %
Platelets: 149 10*3/uL — ABNORMAL LOW (ref 150–400)
RBC: 2.35 MIL/uL — ABNORMAL LOW (ref 3.87–5.11)
RDW: 18.5 % — ABNORMAL HIGH (ref 11.5–15.5)
WBC: 10.1 10*3/uL (ref 4.0–10.5)
nRBC: 0.2 % (ref 0.0–0.2)

## 2022-07-09 LAB — HEMOGLOBIN AND HEMATOCRIT, BLOOD
HCT: 26.1 % — ABNORMAL LOW (ref 36.0–46.0)
HCT: 25.4 % — ABNORMAL LOW (ref 36.0–46.0)
Hemoglobin: 8.7 g/dL — ABNORMAL LOW (ref 12.0–15.0)
Hemoglobin: 8.7 g/dL — ABNORMAL LOW (ref 12.0–15.0)

## 2022-07-09 LAB — PROTIME-INR
INR: 2.1 — ABNORMAL HIGH (ref 0.8–1.2)
Prothrombin Time: 23.2 seconds — ABNORMAL HIGH (ref 11.4–15.2)

## 2022-07-09 LAB — C DIFFICILE QUICK SCREEN W PCR REFLEX
C Diff antigen: NEGATIVE
C Diff interpretation: NOT DETECTED
C Diff toxin: NEGATIVE

## 2022-07-09 LAB — PREPARE RBC (CROSSMATCH)

## 2022-07-09 LAB — CBG MONITORING, ED
Glucose-Capillary: 340 mg/dL — ABNORMAL HIGH (ref 70–99)
Glucose-Capillary: 298 mg/dL — ABNORMAL HIGH (ref 70–99)
Glucose-Capillary: 325 mg/dL — ABNORMAL HIGH (ref 70–99)
Glucose-Capillary: 298 mg/dL — ABNORMAL HIGH (ref 70–99)

## 2022-07-09 LAB — MAGNESIUM: Magnesium: 2 mg/dL (ref 1.7–2.4)

## 2022-07-09 LAB — GLUCOSE, CAPILLARY: Glucose-Capillary: 413 mg/dL — ABNORMAL HIGH (ref 70–99)

## 2022-07-09 MED ORDER — AMLODIPINE BESYLATE 10 MG PO TABS
10.0000 mg | ORAL_TABLET | Freq: Every day | ORAL | Status: DC
  Administered 2022-07-09 – 2022-07-14 (×5): 10 mg via ORAL
  Filled 2022-07-09 (×2): qty 1
  Filled 2022-07-09: qty 2
  Filled 2022-07-09 (×3): qty 1

## 2022-07-09 MED ORDER — PREDNISONE 20 MG PO TABS
20.0000 mg | ORAL_TABLET | Freq: Every day | ORAL | Status: DC
  Administered 2022-07-09 – 2022-07-11 (×2): 20 mg via ORAL
  Filled 2022-07-09 (×3): qty 1

## 2022-07-09 MED ORDER — INSULIN ASPART 100 UNIT/ML IJ SOLN
10.0000 [IU] | Freq: Once | INTRAMUSCULAR | Status: AC
  Administered 2022-07-09: 10 [IU] via SUBCUTANEOUS

## 2022-07-09 MED ORDER — ALPRAZOLAM 0.5 MG PO TABS
0.5000 mg | ORAL_TABLET | Freq: Every day | ORAL | Status: DC
  Administered 2022-07-09 – 2022-07-13 (×5): 0.5 mg via ORAL
  Filled 2022-07-09 (×5): qty 1

## 2022-07-09 MED ORDER — VANCOMYCIN HCL 125 MG PO CAPS
125.0000 mg | ORAL_CAPSULE | Freq: Two times a day (BID) | ORAL | Status: DC
  Administered 2022-07-09 – 2022-07-14 (×9): 125 mg via ORAL
  Filled 2022-07-09 (×12): qty 1

## 2022-07-09 MED ORDER — PROCHLORPERAZINE EDISYLATE 10 MG/2ML IJ SOLN
10.0000 mg | Freq: Four times a day (QID) | INTRAMUSCULAR | Status: DC | PRN
  Administered 2022-07-09 – 2022-07-13 (×4): 10 mg via INTRAVENOUS
  Filled 2022-07-09 (×4): qty 2

## 2022-07-09 MED ORDER — TADALAFIL 20 MG PO TABS
40.0000 mg | ORAL_TABLET | Freq: Every day | ORAL | Status: DC
  Administered 2022-07-09 – 2022-07-13 (×5): 40 mg via ORAL
  Filled 2022-07-09 (×6): qty 2

## 2022-07-09 MED ORDER — ONDANSETRON HCL 4 MG PO TABS: 4.0000 mg | ORAL_TABLET | Freq: Four times a day (QID) | ORAL | Status: DC | PRN

## 2022-07-09 MED ORDER — HYDROMORPHONE HCL 1 MG/ML IJ SOLN
0.5000 mg | Freq: Once | INTRAMUSCULAR | Status: AC
  Administered 2022-07-09: 0.5 mg via INTRAVENOUS
  Filled 2022-07-09: qty 1

## 2022-07-09 MED ORDER — INSULIN ASPART 100 UNIT/ML IJ SOLN
0.0000 [IU] | INTRAMUSCULAR | Status: DC
  Administered 2022-07-09: 7 [IU] via SUBCUTANEOUS
  Filled 2022-07-09: qty 0.09

## 2022-07-09 MED ORDER — INSULIN ASPART 100 UNIT/ML IJ SOLN
0.0000 [IU] | INTRAMUSCULAR | Status: DC
  Administered 2022-07-09: 5 [IU] via SUBCUTANEOUS
  Administered 2022-07-09: 7 [IU] via SUBCUTANEOUS
  Administered 2022-07-09: 5 [IU] via SUBCUTANEOUS
  Administered 2022-07-10: 9 [IU] via SUBCUTANEOUS
  Filled 2022-07-09: qty 0.09

## 2022-07-09 MED ORDER — SODIUM CHLORIDE 0.9% IV SOLUTION: Freq: Once | INTRAVENOUS | Status: DC

## 2022-07-09 MED ORDER — ONDANSETRON HCL 4 MG/2ML IJ SOLN
4.0000 mg | Freq: Four times a day (QID) | INTRAMUSCULAR | Status: DC | PRN
  Administered 2022-07-09 – 2022-07-12 (×7): 4 mg via INTRAVENOUS
  Filled 2022-07-09 (×7): qty 2

## 2022-07-09 MED ORDER — OXYCODONE HCL 5 MG PO TABS: 5.0000 mg | ORAL_TABLET | Freq: Once | ORAL | Status: DC

## 2022-07-09 MED ORDER — HYDROMORPHONE HCL 1 MG/ML IJ SOLN
0.5000 mg | Freq: Once | INTRAMUSCULAR | Status: AC
  Administered 2022-07-09: 0.5 mg via INTRAVENOUS
  Filled 2022-07-09: qty 0.5

## 2022-07-09 MED ORDER — ACETAMINOPHEN 650 MG RE SUPP: 650.0000 mg | Freq: Four times a day (QID) | RECTAL | Status: DC | PRN

## 2022-07-09 MED ORDER — ACETAMINOPHEN 325 MG PO TABS
650.0000 mg | ORAL_TABLET | Freq: Four times a day (QID) | ORAL | Status: DC | PRN
  Administered 2022-07-09: 650 mg via ORAL
  Filled 2022-07-09: qty 2

## 2022-07-09 MED ORDER — HYDRALAZINE HCL 50 MG PO TABS
100.0000 mg | ORAL_TABLET | Freq: Three times a day (TID) | ORAL | Status: DC
  Administered 2022-07-09 – 2022-07-14 (×13): 100 mg via ORAL
  Filled 2022-07-09 (×16): qty 2

## 2022-07-09 NOTE — Progress Notes (Signed)
Notified provider via page about BS 413 @ 2013.

## 2022-07-09 NOTE — Consult Note (Signed)
[facility] Gastroenterology Consult  Referring Provider: No ref. provider found Primary Care Physician:  [name], [name], MD Primary Gastroenterologist: [facility] (has seen Dr. Paulita Fujita in 2020 and Dr. Michail Sermon in 2019)  Reason for Consultation: melena  SUBJECTIVE:   HPI: [name] is a 81 y.o. female with past medical history significant for mechanical aortic valve on coumadin therapy, ANCA vasculitis on prednisone (20 mg PO daily prior to admission), chronic kidney disease, atrial fibrillation, type 2 diabetes mellitus, history of c.diff and hemolytic anemia (requiring regular transfusions).   She is accompanied by her son and family member in ER today. She notes having few day history of melena. This has occurred in the past (July 2023) though resolved spontaneously. She was experiencing diffuse abdominal pain as well as nausea and bilious vomiting. She denied chest pain and shortness of breath. No hematochezia.   On presentation she had Hgb 8.7 which down trended to 5.6, INR 5.4 which improved to 2.1 status post vitamin K, PLT 136, WBC 7.1, Na 132, K 5.2, BUN/Cr 138/2.77, lactic acidosis 2.7. CT scan of the abdomen and pelvis on 07/08/22 showed duodenal diverticulum with marked wall thickening in second and third portion of the duodenum with adjacent mesenteric fat stranding, colonic diverticulosis with mild thickening in the ascending colon and cecum. She was started on Zosyn, IV PPI infusion.  Past endoscopic history:  EGD 02/27/19 (Dr. Paulita Fujita) for iron deficiency anemia and suspected upper GI bleed with findings of small hiatal hernia, patchy mildly friable mucosa with contact bleeding in gastric fundus, body and antrum. Normal appearing duodenal bulb and second portion of duodenum.  Colonoscopy 01/22/18 (Dr. Michail Sermon) for iron deficiency anemia (first colonoscopy) with findings of diverticulosis in descending and sigmoid colon, internal hemorrhoids.  Past Medical History:  Diagnosis Date    Aortic atherosclerosis ([place]) 01/23/2018   Atrial fibrillation (HCC)    Benign positional vertigo 10/06/2011   Carotid artery disease ([place])    Chemotherapy induced neutropenia ([place]) 07/21/2019   Cholelithiasis 01/23/2018   Cholelithiasis 01/23/2018   Chronic anticoagulation    Colovesical fistula, ruled out 04/14/2022   Coronary artery disease    status post coronary artery bypass grafting times 07/10/2004   Diabetes mellitus    Hypercholesteremia    Hypertension    Mechanical heart valve present    H. aortic valve replacement at the time of bypass surgery October 2005   Moderate to severe pulmonary hypertension ([place])    Peripheral arterial disease ([place])    history of left common iliac artery PTA and stenting for a chronic total occlusion 08/26/01   S/P cholecystectomy 03/06/2018   Past Surgical History:  Procedure Laterality Date   anterior repair  2009   AORTIC VALVE REPLACEMENT  2005   Dr. Cyndia Bent   CARDIAC CATHETERIZATION  11/10/2004   40% right common illiac, 70% in stent restenosis of distal left common illiac,    CARDIAC CATHETERIZATION  05/18/2004   LAD 50-70% midstenosis, RCA dominant w/50% stenosis, 50% Right common Illiac artery ostial stenosis, 90% in stent restenosis within midportion of left common illiac stent   Carotid Duplex  03/12/2012   RSA-elev. velocities suggestive of a 50-69% diameter reduction, Right&Left Bulb/Prox ICA-mild-mod.fibrous plaqueelevating Velocities abnormal study.   CHOLECYSTECTOMY N/A 03/01/2018   Procedure: LAPAROSCOPIC CHOLECYSTECTOMY;  Surgeon: Judeth Horn, MD;  Location: [place];  Service: General;  Laterality: N/A;   COLONOSCOPY WITH PROPOFOL N/A 01/22/2018   Procedure: COLONOSCOPY WITH PROPOFOL;  Surgeon: Wilford Corner, MD;  Location: [place];  Service: Endoscopy;  Laterality: N/A;   ESOPHAGOGASTRODUODENOSCOPY (EGD) WITH PROPOFOL N/A 01/22/2018   Procedure: ESOPHAGOGASTRODUODENOSCOPY (EGD) WITH PROPOFOL;  Surgeon: Wilford Corner,  MD;  Location: [place];  Service: Endoscopy;  Laterality: N/A;   ESOPHAGOGASTRODUODENOSCOPY (EGD) WITH PROPOFOL N/A 11/21/2018   Procedure: ESOPHAGOGASTRODUODENOSCOPY (EGD) WITH PROPOFOL;  Surgeon: [name], MD;  Location: [place];  Service: Gastroenterology;  Laterality: N/A;   ESOPHAGOGASTRODUODENOSCOPY (EGD) WITH PROPOFOL Left 02/27/2019   Procedure: ESOPHAGOGASTRODUODENOSCOPY (EGD) WITH PROPOFOL;  Surgeon: Arta Silence, MD;  Location: [facility] ENDOSCOPY;  Service: Endoscopy;  Laterality: Left;   GIVENS CAPSULE STUDY N/A 11/21/2018   Procedure: GIVENS CAPSULE STUDY;  Surgeon: [name], MD;  Location: [place];  Service: Gastroenterology;  Laterality: N/A;  To be deployed during EGD   Lower Ext. Duplex  03/12/2012   Right Proximal CIA- vessel narrowing w/elevated velocities 0-49% diameter reduction. Right SFA-mild mixed density plaque throughout vessel.   NM MYOCAR PERF WALL MOTION  05/19/2010   protocol: Persantine, post stress EF 65%, negative for ischemia, low risk scan   RIGHT HEART CATH N/A 06/27/2018   Procedure: RIGHT HEART CATH;  Surgeon: [name], MD;  Location: [place] CV LAB;  Service: Cardiovascular;  Laterality: N/A;   TOTAL ABDOMINAL HYSTERECTOMY W/ BILATERAL SALPINGOOPHORECTOMY  1989   TRANSTHORACIC ECHOCARDIOGRAM  08/29/2012   Moderately calcified annulus of mitral valve, moderate regurg. of both mitral valve and tricuspid valve.    Prior to Admission medications   Medication Sig Start Date End Date Taking? Authorizing Provider  acetaminophen (TYLENOL) 325 MG tablet Take 325 mg by mouth as needed for mild pain or fever.   Yes [provider]  alendronate (FOSAMAX) 70 MG tablet Take 70 mg by mouth once a week. 12/06/21  Yes [provider]  allopurinol (ZYLOPRIM) 100 MG tablet Take 100 mg by mouth daily.   Yes [provider]  ALPRAZolam Duanne Moron) 0.5 MG tablet Take 0.5 mg by mouth at bedtime. 07/27/21  Yes [provider]  amLODipine (NORVASC) 10 MG tablet TAKE 1 TABLET EVERY DAY Patient taking differently: Take 10 mg by mouth daily. 05/17/22  Yes [name], MD  aspirin EC 81 MG tablet Take 81 mg by mouth daily. Swallow whole.   Yes [provider]  atorvastatin (LIPITOR) 80 MG tablet Take 1 tablet (80 mg total) by mouth daily at 6 PM. Patient taking differently: Take 80 mg by mouth daily. 11/22/18  Yes [name], MD  Cholecalciferol (VITAMIN D3) 50 MCG (2000 UT) capsule Take 2,000 Units by mouth daily with lunch. 11/26/18  Yes [provider]  cholestyramine Lucrezia Starch) 4 GM/DOSE powder Take 4 g by mouth daily after supper. 06/07/22  Yes [provider]  cloNIDine (CATAPRES) 0.2 MG tablet TAKE 1 TABLET TWICE DAILY Patient taking differently: Take 0.2 mg by mouth 2 (two) times daily. 06/22/21  Yes [name], MD  Cyanocobalamin (VITAMIN B12) 1000 MCG TBCR Take 1,000 mcg by mouth daily with lunch.   Yes [provider]  estradiol (ESTRACE) 0.1 MG/GM vaginal cream Place 0.5g nightly for two weeks then twice a week after Patient taking differently: Place 1 Applicatorful vaginally. 09/19/21  Yes Jaquita Folds, MD  famotidine (PEPCID) 20 MG tablet Take 1 tablet (20 mg total) by mouth 2 (two) times daily. 04/10/22  Yes Benay Pike, MD  folic acid (FOLVITE) 1 MG tablet Take 1 mg by mouth daily.   Yes [provider]  furosemide (LASIX) 40 MG tablet TAKE 2  TABLETS (80 MG TOTAL) IN MORNING AND 1 AND 1/2 TABLETS (60 MG TOTAL) IN EVENING. CHANGE IN DOSAGE. Patient taking differently: Take 80 mg by mouth 2 (two) times daily. 03/22/22  Yes Clegg, Amy D, NP  hydrALAZINE (APRESOLINE) 100 MG tablet TAKE 1 TABLET THREE TIMES DAILY Patient taking differently: Take 100 mg by mouth 3 (three) times daily. 02/17/22  Yes [name], MD  insulin aspart (NOVOLOG) 100 UNIT/ML injection Take 2 units for blood sugar 200-250, 4 units for blood sugar 252-300, 6 units for  blood sugar 301-350, 8 units for blood sugar greater than 350 and call MD. Patient taking differently: Inject 10 Units into the skin 3 (three) times daily with meals. 02/23/22  Yes Causey, Charlestine Massed, NP  lidocaine (XYLOCAINE) 2 % solution Use as directed 15 mLs in the mouth or throat as needed for mouth pain. Patient taking differently: Use as directed in the mouth or throat 3 (three) times daily as needed for mouth pain. 01/30/22  Yes Benay Pike, MD  metoprolol succinate (TOPROL-XL) 25 MG 24 hr tablet TAKE 1/2 TABLET EVERY DAY Patient taking differently: Take 12.5 mg by mouth in the morning and at bedtime. 07/19/21  Yes [name], MD  nystatin-alum & mag hydroxide-simeth-diphenhydrAMINE Swish 5 mLs by mouth 3 times daily then spit 05/15/22  Yes Walisiewicz, Kaitlyn E, PA-C  ondansetron (ZOFRAN-ODT) 4 MG disintegrating tablet Take 1 tablet (4 mg total) by mouth every 8 (eight) hours as needed for vomiting or nausea. Patient taking differently: Take 4 mg by mouth as needed for vomiting or nausea. 05/15/22  Yes Walisiewicz, Kaitlyn E, PA-C  predniSONE (DELTASONE) 10 MG tablet Take 2 tablets (20 mg total) by mouth daily with breakfast. 06/16/22 07/16/22 Yes Iruku, Arletha Pili, MD  saccharomyces boulardii (FLORASTOR) 250 MG capsule Take 250 mg by mouth 2 (two) times daily.   Yes [provider]  simethicone (GAS-X) 80 MG chewable tablet Chew 1 tablet (80 mg total) by mouth 4 (four) times daily as needed for flatulence. Patient taking differently: Chew 80 mg by mouth as needed for flatulence. 04/16/22 04/16/23 Yes Danford, Suann Larry, MD  spironolactone (ALDACTONE) 25 MG tablet TAKE 1 TABLET EVERY DAY Patient taking differently: Take 25 mg by mouth daily. 05/17/22  Yes [name], MD  tadalafil, PAH, (ADCIRCA) 20 MG tablet Take 2 tablets (40 mg total) by mouth daily. Patient taking differently: Take 40 mg by mouth at bedtime. 12/06/21  Yes [name], MD  TRESIBA FLEXTOUCH 100  UNIT/ML FlexTouch Pen Inject into the skin at bedtime. 02/20/22  Yes [provider]  vitamin C (ASCORBIC ACID) 500 MG tablet Take 500 mg by mouth daily with lunch.   Yes [provider]  warfarin (COUMADIN) 5 MG tablet TAKE 1 TO 1 AND 1/2 TABLETS DAILY AS DIRECTED BY COUMADIN CLINIC Patient taking differently: Take 5-7.5 mg by mouth See admin instructions. Take 5 mg once daily on Monday, Tuesday, Wednesday, Thursday, and Saturday. Take 7.5 mg by mouth on Friday and Sunday. 05/01/22  Yes Lorretta Harp, MD  Insulin Syringe-Needle U-100 (B-D INS SYR ULTRAFINE .3CC/29G) 29G X 1/2" 0.3 ML MISC Use for sliding scale for breakthrough high blood sugar readings 02/24/22   Benay Pike, MD  mirabegron ER (MYRBETRIQ) 25 MG TB24 tablet Take 1 tablet (25 mg total) by mouth daily. Patient not taking: Reported on 06/22/2022 04/17/22   Daleen Bo, MD  pantoprazole (PROTONIX) 40 MG tablet Take 40 mg by mouth 2 (two) times  daily. Patient not taking: Reported on 07/09/2022 06/25/22   [provider]  sodium bicarbonate 650 MG tablet Take 1 tablet (650 mg total) by mouth 2 (two) times daily. Patient not taking: Reported on 07/09/2022 06/25/22 09/23/22  [place] Eric J, DO   Current Facility-Administered Medications  Medication Dose Route Frequency Provider Last Rate Last Admin   0.9 %  sodium chloride infusion (Manually program via Guardrails IV Fluids)   Intravenous Once [place] Donnamarie Poag, DO   Held at 07/09/22 1014   acetaminophen (TYLENOL) tablet 650 mg  650 mg Oral Q6H PRN Kristopher Oppenheim, DO       Or   acetaminophen (TYLENOL) suppository 650 mg  650 mg Rectal Q6H PRN Kristopher Oppenheim, DO       ALPRAZolam Duanne Moron) tablet 0.5 mg  0.5 mg Oral QHS [place] Eric J, DO       amLODipine (NORVASC) tablet 10 mg  10 mg Oral Daily [place] Donnamarie Poag, DO   10 mg at 07/09/22 1545   hydrALAZINE (APRESOLINE) tablet 100 mg  100 mg Oral TID [place] Eric J, DO   100 mg at 07/09/22 1545   insulin aspart (novoLOG)  injection 0-9 Units  0-9 Units Subcutaneous Q4H Kristopher Oppenheim, DO   5 Units at 07/09/22 1525   ondansetron (ZOFRAN) tablet 4 mg  4 mg Oral Q6H PRN Kristopher Oppenheim, DO       Or   ondansetron [facility]) injection 4 mg  4 mg Intravenous Q6H PRN Kristopher Oppenheim, DO   4 mg at 07/09/22 0258   pantoprozole (PROTONIX) 80 mg /NS 100 mL infusion  8 mg/hr Intravenous Continuous Kristopher Oppenheim, DO 10 mL/hr at 07/09/22 1803 8 mg/hr at 07/09/22 1803   piperacillin-tazobactam (ZOSYN) IVPB 2.25 g  2.25 g Intravenous Q8H Kristopher Oppenheim, DO   Stopped at 07/09/22 1351   predniSONE (DELTASONE) tablet 20 mg  20 mg Oral Q breakfast Kristopher Oppenheim, DO   20 mg at 07/09/22 1610   prochlorperazine (COMPAZINE) injection 10 mg  10 mg Intravenous Q6H PRN [place] Eric J, DO   10 mg at 07/09/22 9604   tadalafil (CIALIS) tablet 40 mg  40 mg Oral QHS [place] Donnamarie Poag, DO       vancomycin (VANCOCIN) capsule 125 mg  125 mg Oral BID [place] Eric J, DO   125 mg at 07/09/22 1021   Allergies as of 07/08/2022 - Review Complete 07/08/2022  Allergen Reaction Noted   Flagyl [metronidazole] Rash and Other (See Comments) 02/21/2018   Augmentin [amoxicillin-pot clavulanate] Diarrhea and Nausea And Vomiting 04/18/2022   Ciprofloxacin Nausea Only and Other (See Comments) 05/13/2021   Coreg [carvedilol] Other (See Comments) 01/18/2018   Diflucan [fluconazole] Diarrhea 02/04/2022   Losartan Swelling and Other (See Comments) 08/22/2013   Privigen [immune globulin (human)] Other (See Comments) 02/07/2022   Sulfamethoxazole-trimethoprim Nausea Only and Other (See Comments) 05/13/2021   Verapamil Hives 08/22/2013   Zetia [ezetimibe] Other (See Comments) 10/06/2011   Zocor [simvastatin - high dose] Other (See Comments) 10/06/2011   Gatifloxacin Rash and Other (See Comments) 03/30/2020   Family History  Problem Relation Age of Onset   Heart disease Father    Hypertension Father    Hyperlipidemia Father    Breast cancer Neg Hx    Social History   Socioeconomic  History   Marital status: Widowed    Spouse name: Not on file   Number of children: Not on file   Years of education: Not on file   Highest  education level: Not on file  Occupational History   Occupation: Retired  Tobacco Use   Smoking status: Former    Packs/day: 1.00    Years: 30.00    Total pack years: 30.00    Types: Cigarettes   Smokeless tobacco: Never   Tobacco comments:    quit smoking 2005  Vaping Use   Vaping Use: Never used  Substance and Sexual Activity   Alcohol use: No    Alcohol/week: 0.0 standard drinks of alcohol   Drug use: No   Sexual activity: Not Currently    Birth control/protection: None  Other Topics Concern   Not on file  Social History Narrative   Not on file   Social Determinants of Health   Financial Resource Strain: Low Risk  (02/28/2022)   Overall Financial Resource Strain (CARDIA)    Difficulty of Paying Living Expenses: Not hard at all  Food Insecurity: No Food Insecurity (07/09/2022)   Hunger Vital Sign    Worried About Running Out of Food in the Last Year: Never true    Ran Out of Food in the Last Year: Never true  Transportation Needs: No Transportation Needs (07/09/2022)   PRAPARE - Hydrologist (Medical): No    Lack of Transportation (Non-Medical): No  Physical Activity: Inactive (02/28/2022)   Exercise Vital Sign    Days of Exercise per Week: 0 days    Minutes of Exercise per Session: 0 min  Stress: No Stress Concern Present (02/28/2022)   [place]    Feeling of Stress : Only a little  Social Connections: Moderately Integrated (06/22/2022)   Social Connection and Isolation Panel [NHANES]    Frequency of Communication with Friends and Family: More than three times a week    Frequency of Social Gatherings with Friends and Family: More than three times a week    Attends Religious Services: More than 4 times per year    Active Member of  [employer] or Organizations: Yes    Attends Archivist Meetings: More than 4 times per year    Marital Status: Widowed  Intimate Partner Violence: Not At Risk (07/09/2022)   Humiliation, Afraid, Rape, and Kick questionnaire    Fear of Current or Ex-Partner: No    Emotionally Abused: No    Physically Abused: No    Sexually Abused: No   Review of Systems:  Review of Systems  Respiratory:  Negative for shortness of breath.   Cardiovascular:  Negative for chest pain.  Gastrointestinal:  Positive for abdominal pain (diffuse), blood in stool, nausea and vomiting.    OBJECTIVE:   Temp:  [97.6 F (36.4 C)-98.8 F (37.1 C)] 98.8 F (37.1 C) (10/01 1757) Pulse Rate:  [88-115] 113 (10/01 1757) Resp:  [16-24] 18 (10/01 1757) BP: (125-180)/(40-74) 131/74 (10/01 1757) SpO2:  [98 %-100 %] 99 % (10/01 1757) Weight:  [68 kg] 68 kg (10/01 1700) Last BM Date : 07/08/22 Physical Exam Constitutional:      General: She is not in acute distress.    Appearance: She is not ill-appearing, toxic-appearing or diaphoretic.  Cardiovascular:     Rate and Rhythm: Rhythm irregular.  Pulmonary:     Effort: No respiratory distress.     Breath sounds: Normal breath sounds.  Abdominal:     General: Bowel sounds are normal.     Palpations: Abdomen is soft.     Tenderness: There is abdominal tenderness (diffuse).  Musculoskeletal:  Right lower leg: Edema present.     Left lower leg: Edema present.  Skin:    General: Skin is warm and dry.  Neurological:     Mental Status: She is alert.     Labs: Recent Labs    07/07/22 1206 07/08/22 1932 07/09/22 0446 07/09/22 1524  WBC 7.1 6.5 10.1  --   HGB 8.7* 5.6* 7.1* 8.7*  HCT 25.7* 17.6* 21.1* 26.1*  PLT 136* 148* 149*  --    BMET Recent Labs    07/07/22 1206 07/08/22 1932 07/09/22 0446  NA 133* 132* 135  K 4.6 5.2* 4.6  CL 101 102 107  CO2 25 19* 18*  GLUCOSE 167* 354* 321*  BUN 82* 138* 139*  CREATININE 2.21* 2.77* 2.69*  CALCIUM  8.4* 7.9* 7.6*   LFT Recent Labs    07/09/22 0446  PROT 4.1*  ALBUMIN 2.4*  AST 22  ALT 16  ALKPHOS 49  BILITOT 0.7   PT/INR Recent Labs    07/08/22 2207 07/09/22 0446  LABPROT 48.8* 23.2*  INR 5.4* 2.1*   Diagnostic imaging: CT ABDOMEN PELVIS WO CONTRAST  Result Date: 07/08/2022 CLINICAL DATA:  Abdominal pain, diarrhea, black stools EXAM: CT ABDOMEN AND PELVIS WITHOUT CONTRAST TECHNIQUE: Multidetector CT imaging of the abdomen and pelvis was performed following the standard protocol without IV contrast. RADIATION DOSE REDUCTION: This exam was performed according to the departmental dose-optimization program which includes automated exposure control, adjustment of the mA and/or kV according to patient size and/or use of iterative reconstruction technique. COMPARISON:  CT abdomen and pelvis 06/22/2022 FINDINGS: Lower chest: No acute abnormality. Hepatobiliary: No focal liver abnormality is seen. Cholecystectomy. No biliary dilation. Pancreas: Small amount of stranding about the head of the pancreas is favored due to duodenitis/duodenal diverticulitis as the majority of the fluid is centered around the duodenum. Spleen: Normal in size without focal abnormality. Adrenals/Urinary Tract: Adrenal glands are unremarkable. Kidneys are normal, without renal calculi, focal lesion, or hydronephrosis. Bladder is unremarkable. Stomach/Bowel: Duodenal diverticulum. Marked wall thickening about the second and third portions of the duodenum with adjacent hazy mesenteric fat stranding. Free fluid about the duodenum tracks inferiorly in the right anterior pararenal fascia. No free intraperitoneal air. Marked colonic diverticulosis. There is mild wall thickening about the ascending colon and cecum. Vascular/Lymphatic: Aortic atherosclerosis. No enlarged abdominal or pelvic lymph nodes. Reproductive: Status post hysterectomy. No adnexal masses. Other: Increased nonspecific subcutaneous stranding in the lower  anterior abdominal subcutaneous fat. Subcutaneous gas in the anterior abdominal wall favored due to injections. Edema within the lateral thighs bilaterally. Musculoskeletal: Advanced degenerative arthritis right hip. Unchanged superior endplate compression of L4. No acute osseous abnormality. IMPRESSION: Marked duodenitis/duodenal diverticulitis. No evidence of perforation. Question additional mild diverticulitis of the ascending colon and cecum. Nonspecific hazy subcutaneous fat stranding within the anterior abdominal wall and bilateral thighs. Small amount of subcutaneous gas in the right anterior abdomen favored to be due to injections. Clinical correlation recommended. Aortic Atherosclerosis (ICD10-I70.0). Electronically Signed   By: Placido Sou M.D.   On: 07/08/2022 20:30    IMPRESSION: Melena Supra-therapeutic INR  - Improved s/p VK Diverticulitis   - Duodenum  - Ascending colon and cecum  Atrial fibrillation Mechanical heart valve History iron deficiency anemia Lactic acidosis History hemolytic anemia  Other: chronic kidney disease, ANCA vasculitis on chronic prednisone therapy, diabetes mellitus  PLAN: - Trend Hgb, transfuse for Hgb < 7  - IV PPI Q12Hr - Trend INR, supra-therapeutic for endoscopy today - Antibiotic  therapy for diverticulitis - Can consider upper endoscopy for melena, though with duodenal diverticulitis may defer - Ok for diet today, NPO at midnight for possible EGD 10/2 - GI will follow   LOS: 1 day   Danton Clap, [facility] Gastroenterology

## 2022-07-09 NOTE — Progress Notes (Signed)
ANTICOAGULATION CONSULT NOTE - Initial Consult  Pharmacy Consult for Warfarin Indication: Mechanical aortic valve  Allergies  Allergen Reactions   Flagyl [Metronidazole] Rash and Other (See Comments)    ALL-OVER BODY RASH   Augmentin [Amoxicillin-Pot Clavulanate] Diarrhea and Nausea And Vomiting   Ciprofloxacin Nausea Only and Other (See Comments)    Stomach ache   Coreg [Carvedilol] Other (See Comments)    Terrible cramping in the feet and had a lot of bowel movements, but not diarrhea   Diflucan [Fluconazole] Diarrhea   Losartan Swelling and Other (See Comments)    Patient doesn't recall site of swelling   Privigen [Immune Globulin (Human)] Other (See Comments)    Severe/excruciating pain   Sulfamethoxazole-Trimethoprim Nausea Only and Other (See Comments)    Stomach upset   Verapamil Hives   Zetia [Ezetimibe] Other (See Comments)    Reaction not recalled   Zocor [Simvastatin - High Dose] Other (See Comments)    Reaction not recalled   Gatifloxacin Rash and Other (See Comments)    Redness to skin around eye    Patient Measurements:      Vital Signs: Temp: 97.6 F (36.4 C) (09/30 1918) Temp Source: Oral (09/30 1918) BP: 127/40 (09/30 2230) Pulse Rate: 88 (09/30 2230)  Labs: Recent Labs    07/07/22 1206 07/08/22 1932 07/08/22 2207  HGB 8.7* 5.6*  --   HCT 25.7* 17.6*  --   PLT 136* 148*  --   LABPROT 48.7*  --  48.8*  INR 5.4*  --  5.4*  CREATININE 2.21* 2.77*  --     Estimated Creatinine Clearance: 12.7 mL/min (A) (by C-G formula based on SCr of 2.77 mg/dL (H)).   Medical History: Past Medical History:  Diagnosis Date   Aortic atherosclerosis ([place]) 01/23/2018   Atrial fibrillation ([facility])    Benign positional vertigo 10/06/2011   Carotid artery disease ([facility])    Chemotherapy induced neutropenia ([place]) 07/21/2019   Cholelithiasis 01/23/2018   Cholelithiasis 01/23/2018   Chronic anticoagulation    Colovesical fistula, ruled out 04/14/2022   Coronary  artery disease    status post coronary artery bypass grafting times 07/10/2004   Diabetes mellitus    Hypercholesteremia    Hypertension    Mechanical heart valve present    H. aortic valve replacement at the time of bypass surgery October 2005   Moderate to severe pulmonary hypertension ([place])    Peripheral arterial disease ([facility])    history of left common iliac artery PTA and stenting for a chronic total occlusion 08/26/01   S/P cholecystectomy 03/06/2018    Medications:  PTA Warfarin '5mg'$  daily except 7.'5mg'$  on Sunday and Friday  Assessment: 81 yr female comes to ED with melanotic stools PMH significant for chronic anemia, CKD, mechanical aortic valve (on warfarin), ANCA + vasculitis, AFib, CHF, DM, h/o C. Diff colitis INR on admission = 5.4; Hbg 5.6 Patient received Vitamin K '1mg'$  IV x 1 dose in ED  Goal of Therapy:  INR 2.5-3.5    Plan:  No warfarin tonight Daily PT/INR and will dose warfarin accordingly  [name], Toribio Harbour, PharmD 07/09/2022,12:28 AM

## 2022-07-09 NOTE — Progress Notes (Signed)
PROGRESS NOTE    [name]  CNO:[id number] DOB: 1941/04/09 DOA: 07/08/2022 PCP: [name], MD    Brief Narrative:   [name] is a 81 y.o. 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 difficile colitis who presented to [facility] ED on 9/30 via EMS with abdominal pain, diarrhea, dark stools.  Patient was seen by her oncologist day prior to admission who follows her for her hemolytic anemia.  She has been unable to wean off of prednisone due to worsening anemia.  Following return from her doctor's appointment, patient reports a sudden urge to defecate with notable melanotic stools around 6 PM.  She reports consistent melanotic stools all night associated with weakness/fatigue.  Also with some generalized abdominal pain but denies fever.  Given persistence of symptoms, patient called EMS and was brought to the ED for further evaluation.  In the ED, temperature 97.6, HR 89, RR 16, BP 133/50, SPO2 100% on room air.  WBC 6.5, hemoglobin 5.6, platelets 148.  Sodium 132, potassium 5.2, chloride 102, CO2 19, glucose 354, BUN 138, creatinine 2.77.  Lipase 51.  AST 23, ALT 16, total bilirubin 0.7.  Lactic acid 2.7.  INR 5.4.  FOBT positive.  C. difficile negative.  CT abdomen/pelvis with marked duodenitis/duodenal diverticulitis no evidence of perforation with questionable mild diverticulitis ascending colon and cecum.  EDP ordered vitamin K for INR reversal and 2 unit PRBCs.  TRH consulted for admission and further evaluation and management of acute GI bleed in setting of supratherapeutic INR.  Assessment & Plan:   Acute blood loss/symptomatic anemia Acute upper GI bleed Hx iron deficiency/anemia of chronic  medical/renal disease Patient presenting to ED with abdominal discomfort, fatigue associated with recurrent melanotic stools.  Hemoglobin 5.6 on admission (recently 8.7 on 9/29 and 9.6 on 9/21).  Additionally BUN elevated at 139 (was 84 on 9/17) and FOBT positive.  Additionally INR elevated at 5.4 which is likely contributing factor.  Etiology likely secondary to upper GI bleed.  Received IV vitamin K per EDP. --[facility] consulted --Transfuse 2 units pRBC on 9/30 --Hgb 5.4>7.1, will transfuse an additional unit, repeat H&H 2 hours following transfusion -- INR 5.4>2.1 -- Protonix drip -- Holding Coumadin -- Given cardiac history, goal Hgb >/= 8.0 -- Monitor on telemetry, supportive care  Acute diverticulitis Lactic acidosis CT abdomen/pelvis with marked duodenitis/duodenal diverticulitis, no evidence of perforation with questionable diverticulitis ascending colon/cecum.  Patient was complaining of some abdominal discomfort. --Zosyn --Oral vancomycin twice daily while on antibiotics for C. difficile colitis prophylaxis --Clear liquid diet -- CBC daily, repeat lactic acid in a.m.  Paroxysmal atrial fibrillation Hx mechanical aortic valve replacement on Coumadin Supratherapeutic INR INR notably elevated at 5.4 on admission, goal 2.0-3.0.  In the setting of active bleeding as above. --Continue to hold Coumadin/aspirin --INR daily  Autoimmune hemolytic anemia ANCA vasculitis Follows with hematology, Dr. Chryl Heck outpatient.  Maintained on prednisone 20 mg p.o. daily.  Continue outpatient follow-up. --Continue prednisone 20 mg p.o. daily  Acute renal failure on CKD stage IV Metabolic acidosis Baseline creatinine 2.2-2.4. Creatinine on admission 2.77; etiology likely secondary to dehydration versus acute blood loss anemia with ATN given GI bleed as above. --Cr 2.77>2.69 --s/p 3u pRBC as above --Avoid nephrotoxins, renal dose all medications  Type 2 diabetes  mellitus Home regimen includes  Tresiba 9-14 units subcutaneously daily, NovoLog insulin sliding scale.  Hemoglobin A1c 6.3 on 04/27/2022, well controlled. --SSI for coverage --Cbgs QAC/HS  Chronic diastolic congestive heart failure Home regimen includes metoprolol succinate 12.5 mg p.o. daily, furosemide 40 mg p.o. twice daily, spironolactone 25 mg p.o. daily. -- Continue metoprolol succinate 12.5 mg p.o. daily -- Hold home spironolactone and furosemide for now -- Strict I's and O's and daily weights -- Monitor BMP daily  Essential hypertension -- Continue home amlodipine 10 mg p.o. daily, hydralazine 100 mg p.o. 3 times daily, metoprolol succinate 12.5 mg p.o. daily, -- Hold home clonidine, furosemide, spironolactone for now -- Hold home aspirin in setting of GI bleed  Anxiety: -- Continue alprazolam as needed  Pulmonary artery hypertension -- Continue tadalafil 40 mg p.o. nightly, prednisone 20 mg p.o. daily  Hx C. difficile colitis C. difficile PCR on admission negative. --Continue vancomycin 125 mg p.o. twice daily while on antibiotics for acute diverticulitis as above for prophylaxis  Weakness/debility/deconditioning: -- PT/OT evaluation tomorrow  Social: Patient currently lives alone, appears to have very poor social support with recurrent hospitalizations.  Patient is a DNR. -- Palliative care consult   DVT prophylaxis: SCDs Start: 07/09/22 0058    Code Status: DNR Family Communication: No family present at bedside this morning  Disposition Plan:  Level of care: Progressive Status is: Inpatient Remains inpatient appropriate because: Pending GI evaluation and palliative care consultation, needs hemoglobin stability before ready for discharge home.    Consultants:  [facility] Palliative care  Procedures:  None  Antimicrobials:  Zosyn 9/29>> Oral Vancomycin 10/1>>   Subjective: Patient seen examined bedside, resting comfortably.  Remains in ED holding area.  Abdominal discomfort improved.   Continues with mild nausea, fatigue.  Hemoglobin up to 7.1 this morning, discussed additional unit to be transfused for goal hemoglobin greater than 8.0 given her cardiac history.  INR down to 2.1.  No other specific questions or concerns at this time.  Awaiting GI evaluation.  Denies headache, no dizziness, no chest pain, no shortness of breath, no palpitations, no vomiting, no focal weakness, no cough/congestion, no paresthesias.  No acute events overnight per nursing staff.  Objective: Vitals:   07/09/22 0946 07/09/22 1023 07/09/22 1033 07/09/22 1318  BP: 135/60 (!) 147/62 (!) 147/62 (!) 164/59  Pulse: (!) 105 (!) 108 (!) 108 (!) 110  Resp: '19 20 20 '$ (!) 22  Temp: 98.2 F (36.8 C) 98.1 F (36.7 C) 98.1 F (36.7 C) 98.4 F (36.9 C)  TempSrc: Oral Oral Oral Oral  SpO2: 100% 99% 99% 98%    Intake/Output Summary (Last 24 hours) at 07/09/2022 1424 Last data filed at 07/09/2022 1004 Gross per 24 hour  Intake 1451.49 ml  Output --  Net 1451.49 ml   There were no vitals filed for this visit.  Examination:  Physical Exam: GEN: NAD, alert and oriented x 3, chronically ill in appearance HEENT: NCAT, PERRL, EOMI, sclera clear, MMM PULM: CTAB w/o wheezes/crackles, normal respiratory effort, on room air CV: RRR w/o M/G/R GI: abd soft, NTND, NABS, no R/G/M MSK: 1+ pitting edema bilateral lower extremities to mid shin, moves all extremities independently NEURO: CN II-XII intact, no focal deficits, sensation to light touch intact PSYCH: normal mood/affect Integumentary: Layers of ecchymosis in various stages of healing, otherwise no other concerning rashes/lesions/wounds noted on exposed skin surfaces.    Data Reviewed: I have personally reviewed following labs and imaging studies  CBC: Recent Labs  Lab  07/07/22 1206 07/08/22 1932 07/09/22 0446  WBC 7.1 6.5 10.1  NEUTROABS 6.5  --  8.0*  HGB 8.7* 5.6* 7.1*  HCT 25.7* 17.6* 21.1*  MCV 91.1 96.7 89.8  PLT 136* 148* 149*   Basic  Metabolic Panel: Recent Labs  Lab 07/07/22 1206 07/08/22 1932 07/09/22 0446  NA 133* 132* 135  K 4.6 5.2* 4.6  CL 101 102 107  CO2 25 19* 18*  GLUCOSE 167* 354* 321*  BUN 82* 138* 139*  CREATININE 2.21* 2.77* 2.69*  CALCIUM 8.4* 7.9* 7.6*  MG  --   --  2.0   GFR: Estimated Creatinine Clearance: 13.1 mL/min (A) (by C-G formula based on SCr of 2.69 mg/dL (H)). Liver Function Tests: Recent Labs  Lab 07/07/22 1206 07/08/22 1932 07/09/22 0446  AST '25 23 22  '$ ALT '15 16 16  '$ ALKPHOS 71 53 49  BILITOT 0.6 0.7 0.7  PROT 5.3* 4.3* 4.1*  ALBUMIN 3.3* 2.5* 2.4*   Recent Labs  Lab 07/08/22 1932  LIPASE 51   No results for input(s): "AMMONIA" in the last 168 hours. Coagulation Profile: Recent Labs  Lab 07/07/22 1206 07/08/22 2207 07/09/22 0446  INR 5.4* 5.4* 2.1*   Cardiac Enzymes: No results for input(s): "CKTOTAL", "CKMB", "CKMBINDEX", "TROPONINI" in the last 168 hours. BNP (last 3 results) No results for input(s): "PROBNP" in the last 8760 hours. HbA1C: No results for input(s): "HGBA1C" in the last 72 hours. CBG: Recent Labs  Lab 07/09/22 0232 07/09/22 0630 07/09/22 1142  GLUCAP 340* 298* 325*   Lipid Profile: No results for input(s): "CHOL", "HDL", "LDLCALC", "TRIG", "CHOLHDL", "LDLDIRECT" in the last 72 hours. Thyroid Function Tests: No results for input(s): "TSH", "T4TOTAL", "FREET4", "T3FREE", "THYROIDAB" in the last 72 hours. Anemia Panel: Recent Labs    07/07/22 1207  RETICCTPCT 2.6   Sepsis Labs: Recent Labs  Lab 07/08/22 2215  LATICACIDVEN 2.7*    Recent Results (from the past 240 hour(s))  C Difficile Quick Screen w PCR reflex     Status: None   Collection Time: 07/08/22 11:03 PM   Specimen: STOOL  Result Value Ref Range Status   C Diff antigen NEGATIVE NEGATIVE Final   C Diff toxin NEGATIVE NEGATIVE Final   C Diff interpretation No C. difficile detected.  Final    Comment: Performed at [facility], [place] [address], [place], [place] [address]         Radiology Studies: CT ABDOMEN PELVIS WO CONTRAST  Result Date: 07/08/2022 CLINICAL DATA:  Abdominal pain, diarrhea, black stools EXAM: CT ABDOMEN AND PELVIS WITHOUT CONTRAST TECHNIQUE: Multidetector CT imaging of the abdomen and pelvis was performed following the standard protocol without IV contrast. RADIATION DOSE REDUCTION: This exam was performed according to the departmental dose-optimization program which includes automated exposure control, adjustment of the mA and/or kV according to patient size and/or use of iterative reconstruction technique. COMPARISON:  CT abdomen and pelvis 06/22/2022 FINDINGS: Lower chest: No acute abnormality. Hepatobiliary: No focal liver abnormality is seen. Cholecystectomy. No biliary dilation. Pancreas: Small amount of stranding about the head of the pancreas is favored due to duodenitis/duodenal diverticulitis as the majority of the fluid is centered around the duodenum. Spleen: Normal in size without focal abnormality. Adrenals/Urinary Tract: Adrenal glands are unremarkable. Kidneys are normal, without renal calculi, focal lesion, or hydronephrosis. Bladder is unremarkable. Stomach/Bowel: Duodenal diverticulum. Marked wall thickening about the second and third portions of the duodenum with adjacent hazy mesenteric fat stranding. Free fluid about the duodenum  tracks inferiorly in the right anterior pararenal fascia. No free intraperitoneal air. Marked colonic diverticulosis. There is mild wall thickening about the ascending colon and cecum. Vascular/Lymphatic: Aortic atherosclerosis. No enlarged abdominal or pelvic lymph nodes. Reproductive: Status post hysterectomy. No adnexal masses. Other: Increased nonspecific subcutaneous stranding in the lower anterior abdominal subcutaneous fat. Subcutaneous gas in the anterior abdominal wall favored due to injections. Edema within the lateral thighs bilaterally. Musculoskeletal: Advanced  degenerative arthritis right hip. Unchanged superior endplate compression of L4. No acute osseous abnormality. IMPRESSION: Marked duodenitis/duodenal diverticulitis. No evidence of perforation. Question additional mild diverticulitis of the ascending colon and cecum. Nonspecific hazy subcutaneous fat stranding within the anterior abdominal wall and bilateral thighs. Small amount of subcutaneous gas in the right anterior abdomen favored to be due to injections. Clinical correlation recommended. Aortic Atherosclerosis (ICD10-I70.0). Electronically Signed   By: Placido Sou M.D.   On: 07/08/2022 20:30        Scheduled Meds:  sodium chloride   Intravenous Once   insulin aspart  0-9 Units Subcutaneous Q4H   predniSONE  20 mg Oral Q breakfast   vancomycin  125 mg Oral BID   Continuous Infusions:  pantoprazole 8 mg/hr (07/09/22 0800)   piperacillin-tazobactam (ZOSYN)  IV 2.25 g (07/09/22 1324)     LOS: 1 day    Time spent: 56 minutes spent on chart review, discussion with nursing staff, consultants, updating family and interview/physical exam; more than 50% of that time was spent in counseling and/or coordination of care.    Sheily Lineman J [place] DO [facility] Available via Epic secure chat 7am-7pm After these hours, please refer to coverage provider listed on [email or web address] 07/09/2022, 2:24 PM

## 2022-07-10 ENCOUNTER — Encounter (HOSPITAL_COMMUNITY): Payer: Self-pay | Admitting: Anesthesiology

## 2022-07-10 ENCOUNTER — Encounter (HOSPITAL_COMMUNITY)
Admission: EM | Disposition: A | Discharge status: Skilled Nursing Facility | Payer: Self-pay | Source: Home / Self Care | Attending: Internal Medicine

## 2022-07-10 DIAGNOSIS — Z7189 Other specified counseling: Secondary | ICD-10-CM

## 2022-07-10 DIAGNOSIS — Z515 Encounter for palliative care: Secondary | ICD-10-CM

## 2022-07-10 DIAGNOSIS — Z5181 Encounter for therapeutic drug level monitoring: Secondary | ICD-10-CM

## 2022-07-10 DIAGNOSIS — R600 Localized edema: Secondary | ICD-10-CM | POA: Diagnosis not present

## 2022-07-10 DIAGNOSIS — K922 Gastrointestinal hemorrhage, unspecified: Secondary | ICD-10-CM | POA: Diagnosis not present

## 2022-07-10 DIAGNOSIS — D649 Anemia, unspecified: Secondary | ICD-10-CM | POA: Diagnosis not present

## 2022-07-10 DIAGNOSIS — D591 Autoimmune hemolytic anemia, unspecified: Secondary | ICD-10-CM | POA: Diagnosis not present

## 2022-07-10 LAB — BASIC METABOLIC PANEL
Anion gap: 11 (ref 5–15)
BUN: 156 mg/dL — ABNORMAL HIGH (ref 8–23)
CO2: 17 mmol/L — ABNORMAL LOW (ref 22–32)
Calcium: 7.7 mg/dL — ABNORMAL LOW (ref 8.9–10.3)
Chloride: 105 mmol/L (ref 98–111)
Creatinine, Ser: 3.41 mg/dL — ABNORMAL HIGH (ref 0.44–1.00)
GFR, Estimated: 13 mL/min — ABNORMAL LOW (ref >60)
Glucose, Bld: 379 mg/dL — ABNORMAL HIGH (ref 70–99)
Potassium: 4.8 mmol/L (ref 3.5–5.1)
Sodium: 133 mmol/L — ABNORMAL LOW (ref 135–145)

## 2022-07-10 LAB — PROTIME-INR
INR: 1.5 — ABNORMAL HIGH (ref 0.8–1.2)
Prothrombin Time: 17.9 seconds — ABNORMAL HIGH (ref 11.4–15.2)

## 2022-07-10 LAB — CBC
HCT: 21.2 % — ABNORMAL LOW (ref 36.0–46.0)
Hemoglobin: 7.1 g/dL — ABNORMAL LOW (ref 12.0–15.0)
MCH: 30.7 pg (ref 26.0–34.0)
MCHC: 33.5 g/dL (ref 30.0–36.0)
MCV: 91.8 fL (ref 80.0–100.0)
Platelets: 136 10*3/uL — ABNORMAL LOW (ref 150–400)
RBC: 2.31 MIL/uL — ABNORMAL LOW (ref 3.87–5.11)
RDW: 18.1 % — ABNORMAL HIGH (ref 11.5–15.5)
WBC: 13.4 10*3/uL — ABNORMAL HIGH (ref 4.0–10.5)
nRBC: 2 % — ABNORMAL HIGH (ref 0.0–0.2)

## 2022-07-10 LAB — GLUCOSE, CAPILLARY
Glucose-Capillary: 170 mg/dL — ABNORMAL HIGH (ref 70–99)
Glucose-Capillary: 185 mg/dL — ABNORMAL HIGH (ref 70–99)
Glucose-Capillary: 206 mg/dL — ABNORMAL HIGH (ref 70–99)
Glucose-Capillary: 300 mg/dL — ABNORMAL HIGH (ref 70–99)
Glucose-Capillary: 393 mg/dL — ABNORMAL HIGH (ref 70–99)
Glucose-Capillary: 408 mg/dL — ABNORMAL HIGH (ref 70–99)

## 2022-07-10 LAB — PREPARE RBC (CROSSMATCH)

## 2022-07-10 LAB — LACTIC ACID, PLASMA: Lactic Acid, Venous: 3.6 mmol/L (ref 0.5–1.9)

## 2022-07-10 SURGERY — ESOPHAGOGASTRODUODENOSCOPY (EGD) WITH PROPOFOL
Anesthesia: Monitor Anesthesia Care

## 2022-07-10 MED ORDER — INSULIN ASPART 100 UNIT/ML IJ SOLN
11.0000 [IU] | Freq: Once | INTRAMUSCULAR | Status: AC
  Administered 2022-07-10: 11 [IU] via SUBCUTANEOUS

## 2022-07-10 MED ORDER — SODIUM CHLORIDE 0.9% IV SOLUTION: Freq: Once | INTRAVENOUS | Status: AC

## 2022-07-10 MED ORDER — INSULIN ASPART 100 UNIT/ML IJ SOLN
0.0000 [IU] | Freq: Three times a day (TID) | INTRAMUSCULAR | Status: DC
  Administered 2022-07-10: 4 [IU] via SUBCUTANEOUS
  Administered 2022-07-10: 11 [IU] via SUBCUTANEOUS
  Administered 2022-07-10: 4 [IU] via SUBCUTANEOUS
  Administered 2022-07-11: 3 [IU] via SUBCUTANEOUS
  Administered 2022-07-11: 7 [IU] via SUBCUTANEOUS
  Administered 2022-07-11: 11 [IU] via SUBCUTANEOUS

## 2022-07-10 MED ORDER — FUROSEMIDE 10 MG/ML IJ SOLN
20.0000 mg | Freq: Once | INTRAMUSCULAR | Status: AC
  Administered 2022-07-10: 20 mg via INTRAVENOUS
  Filled 2022-07-10: qty 2

## 2022-07-10 MED ORDER — HYDROMORPHONE HCL 1 MG/ML IJ SOLN
1.0000 mg | INTRAMUSCULAR | Status: DC | PRN
  Administered 2022-07-10 – 2022-07-13 (×7): 1 mg via INTRAVENOUS
  Filled 2022-07-10 (×8): qty 1

## 2022-07-10 MED ORDER — MORPHINE SULFATE (PF) 2 MG/ML IV SOLN
1.0000 mg | INTRAVENOUS | Status: DC | PRN
  Administered 2022-07-10 (×2): 1 mg via INTRAVENOUS
  Filled 2022-07-10 (×2): qty 1

## 2022-07-10 MED ORDER — INSULIN GLARGINE-YFGN 100 UNIT/ML ~~LOC~~ SOLN
10.0000 [IU] | Freq: Every day | SUBCUTANEOUS | Status: DC
  Administered 2022-07-10: 10 [IU] via SUBCUTANEOUS
  Filled 2022-07-10 (×2): qty 0.1

## 2022-07-10 MED ORDER — SODIUM CHLORIDE 0.9 % IV SOLN: INTRAVENOUS | Status: DC

## 2022-07-10 NOTE — Progress Notes (Signed)
Ended patients second unit of blood at 1606. Vitals charted; unable to change charting to verify stopped action for blood.

## 2022-07-10 NOTE — Progress Notes (Signed)
Subjective: EGD was canceled today as patient's heart rate was 120/min and patient complained of severe abdominal pain. Patient was seen and examined at bedside in presence of her daughter. It seems patient continues to have epigastric and upper abdominal pain, with minimal relief with use of morphine.  Objective: Vital signs in last 24 hours: Temp:  [97.6 F (36.4 C)-98.8 F (37.1 C)] 97.9 F (36.6 C) (10/02 1338) Pulse Rate:  [95-125] 125 (10/02 1338) Resp:  [16-22] 20 (10/02 1338) BP: (131-172)/(42-74) 164/55 (10/02 1338) SpO2:  [97 %-100 %] 100 % (10/02 1338) Weight:  [68 kg] 68 kg (10/01 1700) Weight change:  Last BM Date : 07/09/22  PE: Elderly, prominent pallor GENERAL: In distress due to abdominal pain  ABDOMEN: Soft, epigastric and upper abdominal tenderness noted, no rebound tenderness bowel sounds audible, no peritoneal signs EXTREMITIES: No deformity  Lab Results: Results for orders placed or performed during the hospital encounter of 07/08/22 (from the past 48 hour(s))  Lipase, blood     Status: None   Collection Time: 07/08/22  7:32 PM  Result Value Ref Range   Lipase 51 11 - 51 U/L    Comment: Performed at [facility], [place] [address], [place], [place] [address]  Comprehensive metabolic panel     Status: Abnormal   Collection Time: 07/08/22  7:32 PM  Result Value Ref Range   Sodium 132 (L) 135 - 145 mmol/L   Potassium 5.2 (H) 3.5 - 5.1 mmol/L   Chloride 102 98 - 111 mmol/L   CO2 19 (L) 22 - 32 mmol/L   Glucose, Bld 354 (H) 70 - 99 mg/dL    Comment: Glucose reference range applies only to samples taken after fasting for at least 8 hours.   BUN 138 (H) 8 - 23 mg/dL    Comment: RESULT CONFIRMED BY MANUAL DILUTION   Creatinine, Ser 2.77 (H) 0.44 - 1.00 mg/dL   Calcium 7.9 (L) 8.9 - 10.3 mg/dL   Total Protein 4.3 (L) 6.5 - 8.1 g/dL   Albumin 2.5 (L) 3.5 - 5.0 g/dL   AST 23 15 - 41 U/L   ALT 16 0 - 44 U/L   Alkaline Phosphatase 53 38 - 126  U/L   Total Bilirubin 0.7 0.3 - 1.2 mg/dL   GFR, Estimated 16 (L) >60 mL/min    Comment: (NOTE) Calculated using the CKD-EPI Creatinine Equation (2021)    Anion gap 11 5 - 15    Comment: Performed at [facility], [place] [address], [place], [place] [address]  CBC     Status: Abnormal   Collection Time: 07/08/22  7:32 PM  Result Value Ref Range   WBC 6.5 4.0 - 10.5 K/uL   RBC 1.82 (L) 3.87 - 5.11 MIL/uL   Hemoglobin 5.6 (LL) 12.0 - 15.0 g/dL    Comment: REPEATED TO VERIFY THIS CRITICAL RESULT HAS VERIFIED AND BEEN CALLED TO KATY GESELL, RN BY MEGAN HAYES ON 09 30 2023 AT 1959, AND HAS BEEN READ BACK. CRITICAL RESULT VERIFIED    HCT 17.6 (L) 36.0 - 46.0 %   MCV 96.7 80.0 - 100.0 fL   MCH 30.8 26.0 - 34.0 pg   MCHC 31.8 30.0 - 36.0 g/dL   RDW 17.0 (H) 11.5 - 15.5 %   Platelets 148 (L) 150 - 400 K/uL   nRBC 0.3 (H) 0.0 - 0.2 %    Comment: Performed at [facility], [place] [address] [place], [place] [address]  Type and screen [place]     Status: None (Preliminary result)   Collection Time: 07/08/22  7:32 PM  Result Value Ref Range   ABO/RH(D) A POS    Antibody Screen NEG    Sample Expiration 07/11/2022,2359    Unit Number [id number]    Blood Component Type RED CELLS,LR    Unit division 00    Status of Unit ISSUED,FINAL    Transfusion Status OK TO TRANSFUSE    Crossmatch Result Compatible    Unit Number [id number]    Blood Component Type RED CELLS,LR    Unit division 00    Status of Unit ISSUED,FINAL    Transfusion Status OK TO TRANSFUSE    Crossmatch Result Compatible    Unit Number [id number]    Blood Component Type RED CELLS,LR    Unit division 00    Status of Unit ISSUED,FINAL    Transfusion Status OK TO TRANSFUSE    Crossmatch Result Compatible    Unit Number [id number]    Blood Component Type RBC LR PHER2    Unit division 00    Status of Unit ISSUED    Transfusion Status OK TO TRANSFUSE     Crossmatch Result      Compatible Performed at [place] [address], [place], [place] [address]    Unit Number [id number]    Blood Component Type RBC LR PHER2    Unit division 00    Status of Unit ISSUED    Transfusion Status OK TO TRANSFUSE    Crossmatch Result Compatible   Prepare RBC (crossmatch)     Status: None   Collection Time: 07/08/22  8:50 PM  Result Value Ref Range   Order Confirmation      ORDER PROCESSED BY BLOOD BANK Performed at [facility], [place] [address] [place], [place] [address]   POC occult blood, ED     Status: Abnormal   Collection Time: 07/08/22  9:12 PM  Result Value Ref Range   Fecal Occult Bld POSITIVE (A) NEGATIVE  Protime-INR     Status: Abnormal   Collection Time: 07/08/22 10:07 PM  Result Value Ref Range   Prothrombin Time 48.8 (H) 11.4 - 15.2 seconds   INR 5.4 (HH) 0.8 - 1.2    Comment: CRITICAL RESULT CALLED TO, READ BACK BY AND VERIFIED WITH: Elpidio Eric, RN [phone] (NOTE) INR goal varies based on device and disease states. Performed at [facility], [place] [address], [place], [place] [address]   Lactic acid, plasma     Status: Abnormal   Collection Time: 07/08/22 10:15 PM  Result Value Ref Range   Lactic Acid, Venous 2.7 (HH) 0.5 - 1.9 mmol/L    Comment: CRITICAL RESULT CALLED TO, READ BACK BY AND VERIFIED WITH GESELL,K. 07/08/22 '@2301'$  BY SEEL,M. Performed at [facility], [place] [address], [place], [place] [address]   C Difficile Quick Screen w PCR reflex     Status: None   Collection Time: 07/08/22 11:03 PM   Specimen: STOOL  Result Value Ref Range   C Diff antigen NEGATIVE NEGATIVE   C Diff toxin NEGATIVE NEGATIVE   C Diff interpretation No C. difficile detected.     Comment: Performed at [facility], [place] [address] [place],  [address]  CBG monitoring, ED     Status: Abnormal   Collection Time: 07/09/22  2:32 AM   Result Value Ref Range   Glucose-Capillary  340 (H) 70 - 99 mg/dL    Comment: Glucose reference range applies only to samples taken after fasting for at least 8 hours.  Comprehensive metabolic panel     Status: Abnormal   Collection Time: 07/09/22  4:46 AM  Result Value Ref Range   Sodium 135 135 - 145 mmol/L   Potassium 4.6 3.5 - 5.1 mmol/L   Chloride 107 98 - 111 mmol/L   CO2 18 (L) 22 - 32 mmol/L   Glucose, Bld 321 (H) 70 - 99 mg/dL    Comment: Glucose reference range applies only to samples taken after fasting for at least 8 hours.   BUN 139 (H) 8 - 23 mg/dL    Comment: RESULT CONFIRMED BY MANUAL DILUTION   Creatinine, Ser 2.69 (H) 0.44 - 1.00 mg/dL   Calcium 7.6 (L) 8.9 - 10.3 mg/dL   Total Protein 4.1 (L) 6.5 - 8.1 g/dL   Albumin 2.4 (L) 3.5 - 5.0 g/dL   AST 22 15 - 41 U/L   ALT 16 0 - 44 U/L   Alkaline Phosphatase 49 38 - 126 U/L   Total Bilirubin 0.7 0.3 - 1.2 mg/dL   GFR, Estimated 17 (L) >60 mL/min    Comment: (NOTE) Calculated using the CKD-EPI Creatinine Equation (2021)    Anion gap 10 5 - 15    Comment: Performed at [facility], [place] [address] [place], [place] [address]  CBC with Differential/Platelet     Status: Abnormal   Collection Time: 07/09/22  4:46 AM  Result Value Ref Range   WBC 10.1 4.0 - 10.5 K/uL   RBC 2.35 (L) 3.87 - 5.11 MIL/uL   Hemoglobin 7.1 (L) 12.0 - 15.0 g/dL    Comment: REPEATED TO VERIFY POST TRANSFUSION SPECIMEN    HCT 21.1 (L) 36.0 - 46.0 %   MCV 89.8 80.0 - 100.0 fL    Comment: POST TRANSFUSION SPECIMEN REPEATED TO VERIFY    MCH 30.2 26.0 - 34.0 pg   MCHC 33.6 30.0 - 36.0 g/dL   RDW 18.5 (H) 11.5 - 15.5 %   Platelets 149 (L) 150 - 400 K/uL   nRBC 0.2 0.0 - 0.2 %   Neutrophils Relative % 79 %   Neutro Abs 8.0 (H) 1.7 - 7.7 K/uL   Lymphocytes Relative 12 %   Lymphs Abs 1.2 0.7 - 4.0 K/uL   Monocytes Relative 7 %   Monocytes Absolute 0.8 0.1 - 1.0 K/uL   Eosinophils Relative 0 %   Eosinophils Absolute 0.0  0.0 - 0.5 K/uL   Basophils Relative 0 %   Basophils Absolute 0.0 0.0 - 0.1 K/uL   Immature Granulocytes 2 %   Abs Immature Granulocytes 0.18 (H) 0.00 - 0.07 K/uL    Comment: Performed at [facility], [place] [address] [place], [place] [address]  Magnesium     Status: None   Collection Time: 07/09/22  4:46 AM  Result Value Ref Range   Magnesium 2.0 1.7 - 2.4 mg/dL    Comment: Performed at [facility], [place] [address], [place], [place] [address]  Protime-INR     Status: Abnormal   Collection Time: 07/09/22  4:46 AM  Result Value Ref Range   Prothrombin Time 23.2 (H) 11.4 - 15.2 seconds   INR 2.1 (H) 0.8 - 1.2    Comment: (NOTE) INR goal varies based on device and disease states. Performed at [facility], [place] [address] [place],  [address]  CBG monitoring, ED     Status: Abnormal   Collection Time: 07/09/22  6:30 AM  Result Value Ref Range   Glucose-Capillary 298 (H) 70 - 99 mg/dL    Comment: Glucose reference range applies only to samples taken after fasting for at least 8 hours.  Prepare RBC (crossmatch)     Status: None   Collection Time: 07/09/22  8:30 AM  Result Value Ref Range   Order Confirmation      ORDER PROCESSED BY BLOOD BANK Performed at [facility], [place] [address] [place], [place] [address]   CBG monitoring, ED     Status: Abnormal   Collection Time: 07/09/22 11:42 AM  Result Value Ref Range   Glucose-Capillary 325 (H) 70 - 99 mg/dL    Comment: Glucose reference range applies only to samples taken after fasting for at least 8 hours.   Comment 1 Notify RN   CBG monitoring, ED     Status: Abnormal   Collection Time: 07/09/22  3:14 PM  Result Value Ref Range   Glucose-Capillary 298 (H) 70 - 99 mg/dL    Comment: Glucose reference range applies only to samples taken after fasting for at least 8 hours.  Hemoglobin and hematocrit, blood     Status: Abnormal   Collection Time:  07/09/22  3:24 PM  Result Value Ref Range   Hemoglobin 8.7 (L) 12.0 - 15.0 g/dL   HCT 26.1 (L) 36.0 - 46.0 %    Comment: Performed at [facility], [place] [address], [place], [place] [address]  Hemoglobin and hematocrit, blood     Status: Abnormal   Collection Time: 07/09/22  8:00 PM  Result Value Ref Range   Hemoglobin 8.7 (L) 12.0 - 15.0 g/dL   HCT 25.4 (L) 36.0 - 46.0 %    Comment: Performed at [facility], [place] [address] [place], [place] [address]  Glucose, capillary     Status: Abnormal   Collection Time: 07/09/22  8:31 PM  Result Value Ref Range   Glucose-Capillary 413 (H) 70 - 99 mg/dL    Comment: Glucose reference range applies only to samples taken after fasting for at least 8 hours.  Glucose, capillary     Status: Abnormal   Collection Time: 07/10/22 12:15 AM  Result Value Ref Range   Glucose-Capillary 408 (H) 70 - 99 mg/dL    Comment: Glucose reference range applies only to samples taken after fasting for at least 8 hours.  Glucose, capillary     Status: Abnormal   Collection Time: 07/10/22  4:12 AM  Result Value Ref Range   Glucose-Capillary 393 (H) 70 - 99 mg/dL    Comment: Glucose reference range applies only to samples taken after fasting for at least 8 hours.  Protime-INR     Status: Abnormal   Collection Time: 07/10/22  6:12 AM  Result Value Ref Range   Prothrombin Time 17.9 (H) 11.4 - 15.2 seconds   INR 1.5 (H) 0.8 - 1.2    Comment: (NOTE) INR goal varies based on device and disease states. Performed at [facility], [place] [address] [place], [place] [address]   CBC     Status: Abnormal   Collection Time: 07/10/22  6:12 AM  Result Value Ref Range   WBC 13.4 (H) 4.0 - 10.5 K/uL   RBC 2.31 (L) 3.87 - 5.11 MIL/uL   Hemoglobin 7.1 (L) 12.0 - 15.0 g/dL   HCT 21.2 (L) 36.0 - 46.0 %  MCV 91.8 80.0 - 100.0 fL   MCH 30.7 26.0 - 34.0 pg   MCHC 33.5 30.0 - 36.0 g/dL   RDW 18.1 (H) 11.5 - 15.5 %   Platelets  136 (L) 150 - 400 K/uL   nRBC 2.0 (H) 0.0 - 0.2 %    Comment: Performed at [facility], [place] [address], [place], [place] [address]  Basic metabolic panel     Status: Abnormal   Collection Time: 07/10/22  6:12 AM  Result Value Ref Range   Sodium 133 (L) 135 - 145 mmol/L   Potassium 4.8 3.5 - 5.1 mmol/L   Chloride 105 98 - 111 mmol/L   CO2 17 (L) 22 - 32 mmol/L   Glucose, Bld 379 (H) 70 - 99 mg/dL    Comment: Glucose reference range applies only to samples taken after fasting for at least 8 hours.   BUN 156 (H) 8 - 23 mg/dL    Comment: RESULT CONFIRMED BY MANUAL DILUTION   Creatinine, Ser 3.41 (H) 0.44 - 1.00 mg/dL   Calcium 7.7 (L) 8.9 - 10.3 mg/dL   GFR, Estimated 13 (L) >60 mL/min    Comment: (NOTE) Calculated using the CKD-EPI Creatinine Equation (2021)    Anion gap 11 5 - 15    Comment: Performed at [facility], [place] [address], [place], [place] [address]  Lactic acid, plasma     Status: Abnormal   Collection Time: 07/10/22  6:12 AM  Result Value Ref Range   Lactic Acid, Venous 3.6 (HH) 0.5 - 1.9 mmol/L    Comment: CRITICAL VALUE NOTED. VALUE IS CONSISTENT WITH PREVIOUSLY REPORTED/CALLED VALUE Performed at [place] [address] [place], [place] [address]   Prepare RBC (crossmatch)     Status: None   Collection Time: 07/10/22  6:56 AM  Result Value Ref Range   Order Confirmation      ORDER PROCESSED BY BLOOD BANK Performed at [facility], [place] [address] [place], [place] [address]   Glucose, capillary     Status: Abnormal   Collection Time: 07/10/22  7:46 AM  Result Value Ref Range   Glucose-Capillary 300 (H) 70 - 99 mg/dL    Comment: Glucose reference range applies only to samples taken after fasting for at least 8 hours.  Glucose, capillary     Status: Abnormal   Collection Time: 07/10/22 11:32 AM  Result Value Ref Range   Glucose-Capillary 170 (H) 70 - 99 mg/dL    Comment: Glucose  reference range applies only to samples taken after fasting for at least 8 hours.   *Note: Due to a large number of results and/or encounters for the requested time period, some results have not been displayed. A complete set of results can be found in Results Review.    Studies/Results: CT ABDOMEN PELVIS WO CONTRAST  Result Date: 07/08/2022 CLINICAL DATA:  Abdominal pain, diarrhea, black stools EXAM: CT ABDOMEN AND PELVIS WITHOUT CONTRAST TECHNIQUE: Multidetector CT imaging of the abdomen and pelvis was performed following the standard protocol without IV contrast. RADIATION DOSE REDUCTION: This exam was performed according to the departmental dose-optimization program which includes automated exposure control, adjustment of the mA and/or kV according to patient size and/or use of iterative reconstruction technique. COMPARISON:  CT abdomen and pelvis 06/22/2022 FINDINGS: Lower chest: No acute abnormality. Hepatobiliary: No focal liver abnormality is seen. Cholecystectomy. No biliary dilation. Pancreas: Small amount of stranding about the head of the pancreas is favored  due to duodenitis/duodenal diverticulitis as the majority of the fluid is centered around the duodenum. Spleen: Normal in size without focal abnormality. Adrenals/Urinary Tract: Adrenal glands are unremarkable. Kidneys are normal, without renal calculi, focal lesion, or hydronephrosis. Bladder is unremarkable. Stomach/Bowel: Duodenal diverticulum. Marked wall thickening about the second and third portions of the duodenum with adjacent hazy mesenteric fat stranding. Free fluid about the duodenum tracks inferiorly in the right anterior pararenal fascia. No free intraperitoneal air. Marked colonic diverticulosis. There is mild wall thickening about the ascending colon and cecum. Vascular/Lymphatic: Aortic atherosclerosis. No enlarged abdominal or pelvic lymph nodes. Reproductive: Status post hysterectomy. No adnexal masses. Other: Increased  nonspecific subcutaneous stranding in the lower anterior abdominal subcutaneous fat. Subcutaneous gas in the anterior abdominal wall favored due to injections. Edema within the lateral thighs bilaterally. Musculoskeletal: Advanced degenerative arthritis right hip. Unchanged superior endplate compression of L4. No acute osseous abnormality. IMPRESSION: Marked duodenitis/duodenal diverticulitis. No evidence of perforation. Question additional mild diverticulitis of the ascending colon and cecum. Nonspecific hazy subcutaneous fat stranding within the anterior abdominal wall and bilateral thighs. Small amount of subcutaneous gas in the right anterior abdomen favored to be due to injections. Clinical correlation recommended. Aortic Atherosclerosis (ICD10-I70.0). Electronically Signed   By: Placido Sou M.D.   On: 07/08/2022 20:30    Medications: I have reviewed the patient's current medications.  Assessment: Abdominal pain  Marked duodenitis and duodenal diverticulitis without perforation  Mild diverticulitis of the ascending and cecum  Soft cutaneous fat stranding within the anterior abdominal wall and bilateral thighs with small amount of subcutaneous gas in the right anterior abdomen?related to injections  Anemia, hemoglobin 5.6 admission since then hemoglobin has been 7.1/8.7/8.7/7.1 Patient received 4 units of PRBC transfusion and is receiving fifth unit PRBC transfusion  Worsening leukocytosis, WBC 13.4 today Worsening renal function BUN elevated at 156, creatinine 3.41, GFR 13 Low total protein 4.1, low albumin 2.1 Elevated lactic acid 3.6  Patient was on Coumadin, INR 5.4 admission and has trended down to 1.5   Plan: Had a long discussion with the patient and her daughter at bedside.  At this point, pain control seems to be of utmost priority.  Patient was receiving ?  Morphine, currently on Dilaudid 1 mg IV every 4 hours as needed, mainly continuing pain management?  Fentanyl  patch.  We will hold off on endoscopy as patient remains at high risk of perforation with duodenal diverticulitis.  Agree with pantoprazole 8 mg/h IV and IV Zosyn, patient empirically started on vancomycin 125 mg twice a day as she has history of C. difficile infection and needs to be on antibiotics.  Prognosis guarded, patient and daughter are leaning towards comfort care and pain management.  Will start her on clear liquid diet.  [name], MD 07/10/2022, 3:24 PM

## 2022-07-10 NOTE — Consult Note (Signed)
Consultation Note Date: 07/10/2022   Patient Name: [name]  DOB: 01/08/1941  MRN: [id number]  Age / Sex: 81 y.o., female   PCP: [name], [name], MD Referring Physician: [place] Eric J, DO  Reason for Consultation: Assist with complex medical decision making     Chief Complaint/History of Present Illness:  Patient is a 81 year old female with a past medical history of type 2 diabetes, autoimmune hemolytic anemia, hyperlipidemia, CKD stage IV, 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 difficile colitis who was admitted on 9/30 for management of abdominal pain, diarrhea, and dark stools.  Since being admitted patient has received work-up for acute blood loss anemia believed to possibly secondary to acute upper GI bleed.  Gastroenterology consulted.  Patient has been found to have acute diverticulitis on imaging is and is receiving antibiotic management.  AKI on CKD in the setting of acute bleed and dehydration.  Extensive review of EMR.  Presented to bedside later in the afternoon.  Patient laying comfortably in bed sleeping.  Patient's daughter, Stefanie Libel, present at bedside.  Did easily awaken.  Able to introduce myself and the role of the palliative care team and patient's care.  During interaction patient was able to respond at times though was grimacing with pain when moving.  Patient noted pain primarily in her abdomen and has been present since Friday.  Patient noted that recent IV medication (Dilaudid) did help with managing pain.  Family member agreed with this.  Patient able to voice that she has heard she has diverticulitis and is bleeding.  When patient became tired during conversation Helene Kelp able to offer further input regarding medical updates.  Patient was supposed to have GI procedure today to further work-up bleed though she was tachycardic and so appropriately procedure was  held at this time.  She had has needed to receive at least 5 blood transfusions as per daughter during hospitalization due to her bleeds.    Daughter able to further elaborate about patient's status outside of the hospital.  She noted the patient has had multiple hospitalizations over this past year normally for pain and bleeding.  She noted that patient will be in the hospital rehab a little and then go home for a week or 2 before needing to be readmitted to the hospital.  Inquired with patient how she feels about these multiple hospitalizations and she notes that she does not like it.  Patient also notes that she is fighting for her family and does feel that the hospitalizations have helped her "feel better".  Inquired if patient had thought about what she would want her medical care like moving forward in the setting of serious medical illness and patient notes that she has not had about this in detail.  Inquired if she had ever completed ACP documentation and she denied that she had.  Patient family able to discuss that patient's independence is incredibly important to her.  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  Patient has been adamant that she will live at home alone.  Patient was willing to stay with a daughter for about a month though remained insistent that she would eventually go back to her own home.  Inquired with the patient if she believed her home was a safe setting due to her history of falls and hospitalizations.  Patient felt that it was safe though hesitant and difficulties answering what is able to keep  her safe with her multiple hospitalizations.  Patient having difficulty with this complex conversation and thought process at time of this interview.    On interview patient having worsening pain so excuse self to go inform RN of this.  Able to step outside room as well with patient's daughter, Helene Kelp.  Helene Kelp was able to further elaborate about patient's multiple  hospitalizations.  He did not believe patient had pleated any ACP documentation though she noted that her brother [name] and Brynda Greathouse are more involved in patient's medical care because they are retired and have the time to do so.  Helene Kelp is continuing to work at this time though does make regular visits to her mother.  For concerns about patient's overall decline which can be seen through her multiple hospitalizations.  Discussed how it is harder to bounce back after a hospitalization order we get.  Also expressed there needed to be further discussions with family and medical providers about what quality time would look like to the patient moving forward.  Would patient want more hospitalizations or would she want to focus on quality time outside of the hospital.  Though also concerned about how this would be accomplished when patient is adamant on remaining independent at home.  Palliative care team would continue to follow along and would attempt to meet other family tomorrow.  Helene Kelp going to follow-up with her brother and sister to see if they have any ACP documentation patient had previously completed.  All questions answered at that time.  Thank patient and family for allowing this provider to visit today.  Primary Diagnoses  Present on Admission:  GI bleeding  Autoimmune hemolytic anemia (HCC)  Diverticulitis  Bilateral lower extremity edema  Chronic diastolic CHF (congestive heart failure) (HCC)  CKD (chronic kidney disease), stage IV (HCC)  Vasculitis, ANCA positive  PAF (paroxysmal atrial fibrillation) (HCC)  GI bleed   Palliative Review of Systems: Patient reports abdominal pain that has been present since Friday.  Patient denies having abdominal pain prior to Friday.  I have reviewed the medical record, interviewed the patient and family, and examined the patient. The following aspects are pertinent.  Past Medical History:  Diagnosis Date   Aortic atherosclerosis ([place])  01/23/2018   Atrial fibrillation (HCC)    Benign positional vertigo 10/06/2011   Carotid artery disease [facility])    Chemotherapy induced neutropenia ([place]) 07/21/2019   Cholelithiasis 01/23/2018   Cholelithiasis 01/23/2018   Chronic anticoagulation    Colovesical fistula, ruled out 04/14/2022   Coronary artery disease    status post coronary artery bypass grafting times 07/10/2004   Diabetes mellitus    Hypercholesteremia    Hypertension    Mechanical heart valve present    H. aortic valve replacement at the time of bypass surgery October 2005   Moderate to severe pulmonary hypertension ([place])    Peripheral arterial disease ([place])    history of left common iliac artery PTA and stenting for a chronic total occlusion 08/26/01   S/P cholecystectomy 03/06/2018   Social History   Socioeconomic History   Marital status: Widowed    Spouse name: Not on file   Number of children: Not on file   Years of education: Not on file   Highest education level: Not on file  Occupational History   Occupation: Retired  Tobacco Use   Smoking status: Former    Packs/day: 1.00    Years: 30.00    Total pack years: 30.00    Types: Cigarettes  Smokeless tobacco: Never   Tobacco comments:    quit smoking 2005  Vaping Use   Vaping Use: Never used  Substance and Sexual Activity   Alcohol use: No    Alcohol/week: 0.0 standard drinks of alcohol   Drug use: No   Sexual activity: Not Currently    Birth control/protection: None  Other Topics Concern   Not on file  Social History Narrative   Not on file   Social Determinants of Health   Financial Resource Strain: Low Risk  (02/28/2022)   Overall Financial Resource Strain (CARDIA)    Difficulty of Paying Living Expenses: Not hard at all  Food Insecurity: No Food Insecurity (07/09/2022)   Hunger Vital Sign    Worried About Running Out of Food in the Last Year: Never true    Ran Out of Food in the Last Year: Never true  Transportation Needs: No  Transportation Needs (07/09/2022)   PRAPARE - Hydrologist (Medical): No    Lack of Transportation (Non-Medical): No  Physical Activity: Inactive (02/28/2022)   Exercise Vital Sign    Days of Exercise per Week: 0 days    Minutes of Exercise per Session: 0 min  Stress: No Stress Concern Present (02/28/2022)   [place]    Feeling of Stress : Only a little  Social Connections: Moderately Integrated (06/22/2022)   Social Connection and Isolation Panel [NHANES]    Frequency of Communication with Friends and Family: More than three times a week    Frequency of Social Gatherings with Friends and Family: More than three times a week    Attends Religious Services: More than 4 times per year    Active Member of [employer] or Organizations: Yes    Attends Archivist Meetings: More than 4 times per year    Marital Status: Widowed   Family History  Problem Relation Age of Onset   Heart disease Father    Hypertension Father    Hyperlipidemia Father    Breast cancer Neg Hx    Scheduled Meds:  sodium chloride   Intravenous Once   ALPRAZolam  0.5 mg Oral QHS   amLODipine  10 mg Oral Daily   hydrALAZINE  100 mg Oral TID   insulin aspart  0-20 Units Subcutaneous TID WC   insulin glargine-yfgn  10 Units Subcutaneous Daily   predniSONE  20 mg Oral Q breakfast   tadalafil  40 mg Oral QHS   vancomycin  125 mg Oral BID   Continuous Infusions:  sodium chloride 75 mL/hr at 07/10/22 1614   pantoprazole 8 mg/hr (07/10/22 1401)   piperacillin-tazobactam (ZOSYN)  IV 2.25 g (07/10/22 1620)   PRN Meds:.acetaminophen **OR** acetaminophen, HYDROmorphone (DILAUDID) injection, ondansetron **OR** ondansetron (ZOFRAN) IV, prochlorperazine Medications Prior to Admission:  Prior to Admission medications   Medication Sig Start Date End Date Taking? Authorizing Provider  acetaminophen (TYLENOL) 325 MG tablet Take  325 mg by mouth as needed for mild pain or fever.   Yes [provider]  alendronate (FOSAMAX) 70 MG tablet Take 70 mg by mouth once a week. 12/06/21  Yes [provider]  allopurinol (ZYLOPRIM) 100 MG tablet Take 100 mg by mouth daily.   Yes [provider]  ALPRAZolam Duanne Moron) 0.5 MG tablet Take 0.5 mg by mouth at bedtime. 07/27/21  Yes [provider]  amLODipine (NORVASC) 10 MG tablet TAKE 1 TABLET EVERY DAY Patient taking differently: Take 10  mg by mouth daily. 05/17/22  Yes [name], MD  aspirin EC 81 MG tablet Take 81 mg by mouth daily. Swallow whole.   Yes [provider]  atorvastatin (LIPITOR) 80 MG tablet Take 1 tablet (80 mg total) by mouth daily at 6 PM. Patient taking differently: Take 80 mg by mouth daily. 11/22/18  Yes [name], MD  Cholecalciferol (VITAMIN D3) 50 MCG (2000 UT) capsule Take 2,000 Units by mouth daily with lunch. 11/26/18  Yes [provider]  cholestyramine Lucrezia Starch) 4 GM/DOSE powder Take 4 g by mouth daily after supper. 06/07/22  Yes [provider]  cloNIDine (CATAPRES) 0.2 MG tablet TAKE 1 TABLET TWICE DAILY Patient taking differently: Take 0.2 mg by mouth 2 (two) times daily. 06/22/21  Yes [name], MD  Cyanocobalamin (VITAMIN B12) 1000 MCG TBCR Take 1,000 mcg by mouth daily with lunch.   Yes [provider]  estradiol (ESTRACE) 0.1 MG/GM vaginal cream Place 0.5g nightly for two weeks then twice a week after Patient taking differently: Place 1 Applicatorful vaginally. 09/19/21  Yes Jaquita Folds, MD  famotidine (PEPCID) 20 MG tablet Take 1 tablet (20 mg total) by mouth 2 (two) times daily. 04/10/22  Yes Benay Pike, MD  folic acid (FOLVITE) 1 MG tablet Take 1 mg by mouth daily.   Yes [provider]  furosemide (LASIX) 40 MG tablet TAKE 2 TABLETS (80 MG TOTAL) IN MORNING AND 1 AND 1/2 TABLETS (60 MG TOTAL) IN EVENING. CHANGE IN DOSAGE. Patient taking  differently: Take 80 mg by mouth 2 (two) times daily. 03/22/22  Yes Clegg, Amy D, NP  hydrALAZINE (APRESOLINE) 100 MG tablet TAKE 1 TABLET THREE TIMES DAILY Patient taking differently: Take 100 mg by mouth 3 (three) times daily. 02/17/22  Yes [name], MD  insulin aspart (NOVOLOG) 100 UNIT/ML injection Take 2 units for blood sugar 200-250, 4 units for blood sugar 252-300, 6 units for blood sugar 301-350, 8 units for blood sugar greater than 350 and call MD. Patient taking differently: Inject 10 Units into the skin 3 (three) times daily with meals. 02/23/22  Yes Causey, Charlestine Massed, NP  lidocaine (XYLOCAINE) 2 % solution Use as directed 15 mLs in the mouth or throat as needed for mouth pain. Patient taking differently: Use as directed in the mouth or throat 3 (three) times daily as needed for mouth pain. 01/30/22  Yes Benay Pike, MD  metoprolol succinate (TOPROL-XL) 25 MG 24 hr tablet TAKE 1/2 TABLET EVERY DAY Patient taking differently: Take 12.5 mg by mouth in the morning and at bedtime. 07/19/21  Yes [name], MD  nystatin-alum & mag hydroxide-simeth-diphenhydrAMINE Swish 5 mLs by mouth 3 times daily then spit 05/15/22  Yes Walisiewicz, Kaitlyn E, PA-C  ondansetron (ZOFRAN-ODT) 4 MG disintegrating tablet Take 1 tablet (4 mg total) by mouth every 8 (eight) hours as needed for vomiting or nausea. Patient taking differently: Take 4 mg by mouth as needed for vomiting or nausea. 05/15/22  Yes Walisiewicz, Kaitlyn E, PA-C  predniSONE (DELTASONE) 10 MG tablet Take 2 tablets (20 mg total) by mouth daily with breakfast. 06/16/22 07/16/22 Yes Iruku, Arletha Pili, MD  saccharomyces boulardii (FLORASTOR) 250 MG capsule Take 250 mg by mouth 2 (two) times daily.   Yes [provider]  simethicone (GAS-X) 80 MG chewable tablet Chew 1 tablet (80 mg total) by mouth 4 (four) times daily as needed for flatulence. Patient taking differently: Chew 80 mg by mouth as needed for flatulence.  04/16/22  04/16/23 Yes Danford, Suann Larry, MD  spironolactone (ALDACTONE) 25 MG tablet TAKE 1 TABLET EVERY DAY Patient taking differently: Take 25 mg by mouth daily. 05/17/22  Yes [name], MD  tadalafil, PAH, (ADCIRCA) 20 MG tablet Take 2 tablets (40 mg total) by mouth daily. Patient taking differently: Take 40 mg by mouth at bedtime. 12/06/21  Yes [name], MD  TRESIBA FLEXTOUCH 100 UNIT/ML FlexTouch Pen Inject into the skin at bedtime. 02/20/22  Yes [provider]  vitamin C (ASCORBIC ACID) 500 MG tablet Take 500 mg by mouth daily with lunch.   Yes [provider]  warfarin (COUMADIN) 5 MG tablet TAKE 1 TO 1 AND 1/2 TABLETS DAILY AS DIRECTED BY COUMADIN CLINIC Patient taking differently: Take 5-7.5 mg by mouth See admin instructions. Take 5 mg once daily on Monday, Tuesday, Wednesday, Thursday, and Saturday. Take 7.5 mg by mouth on Friday and Sunday. 05/01/22  Yes Lorretta Harp, MD  Insulin Syringe-Needle U-100 (B-D INS SYR ULTRAFINE .3CC/29G) 29G X 1/2" 0.3 ML MISC Use for sliding scale for breakthrough high blood sugar readings 02/24/22   Benay Pike, MD  mirabegron ER (MYRBETRIQ) 25 MG TB24 tablet Take 1 tablet (25 mg total) by mouth daily. Patient not taking: Reported on 06/22/2022 04/17/22   Daleen Bo, MD  pantoprazole (PROTONIX) 40 MG tablet Take 40 mg by mouth 2 (two) times daily. Patient not taking: Reported on 07/09/2022 06/25/22   [provider]  sodium bicarbonate 650 MG tablet Take 1 tablet (650 mg total) by mouth 2 (two) times daily. Patient not taking: Reported on 07/09/2022 06/25/22 09/23/22  [place] Eric J, DO   Allergies  Allergen Reactions   Flagyl [Metronidazole] Rash and Other (See Comments)    ALL-OVER BODY RASH   Augmentin [Amoxicillin-Pot Clavulanate] Diarrhea and Nausea And Vomiting   Ciprofloxacin Diarrhea, Nausea Only and Other (See Comments)    Stomach ache   Coreg [Carvedilol] Other (See Comments)    Terrible cramping in  the feet and had a lot of bowel movements, but not diarrhea   Diflucan [Fluconazole] Diarrhea   Losartan Swelling and Other (See Comments)    Patient doesn't recall site of swelling   Privigen [Immune Globulin (Human)] Other (See Comments)    Severe/excruciating pain   Sulfamethoxazole-Trimethoprim Diarrhea, Nausea Only and Other (See Comments)    Stomach upset   Verapamil Hives   Zetia [Ezetimibe] Other (See Comments)    Reaction not recalled   Zocor [Simvastatin - High Dose] Other (See Comments)    Reaction not recalled   Gatifloxacin Rash and Other (See Comments)    Redness to skin around eye   CBC:    Component Value Date/Time   WBC 13.4 (H) 07/10/2022 0612   HGB 7.1 (L) 07/10/2022 0612   HGB 7.8 (L) 05/25/2022 1131   HCT 21.2 (L) 07/10/2022 0612   HCT 28.5 (L) 11/20/2018 1824   PLT 136 (L) 07/10/2022 0612   PLT 105 (L) 05/25/2022 1131   MCV 91.8 07/10/2022 0612   NEUTROABS 8.0 (H) 07/09/2022 0446   LYMPHSABS 1.2 07/09/2022 0446   MONOABS 0.8 07/09/2022 0446   EOSABS 0.0 07/09/2022 0446   BASOSABS 0.0 07/09/2022 0446   Comprehensive Metabolic Panel:    Component Value Date/Time   NA 133 (L) 07/10/2022 0612   NA 137 12/06/2017 1436   K 4.8 07/10/2022 0612   CL 105 07/10/2022 0612   CO2 17 (L) 07/10/2022 0612   BUN 156 (H)  07/10/2022 0612   BUN 20 12/06/2017 1436   CREATININE 3.41 (H) 07/10/2022 0612   CREATININE 2.21 (H) 07/07/2022 1206   GLUCOSE 379 (H) 07/10/2022 0612   CALCIUM 7.7 (L) 07/10/2022 0612   AST 22 07/09/2022 0446   AST 25 07/07/2022 1206   ALT 16 07/09/2022 0446   ALT 15 07/07/2022 1206   ALKPHOS 49 07/09/2022 0446   BILITOT 0.7 07/09/2022 0446   BILITOT 0.6 07/07/2022 1206   PROT 4.1 (L) 07/09/2022 0446   ALBUMIN 2.4 (L) 07/09/2022 0446    Physical Exam: Vital Signs: BP (!) 161/62   Pulse (!) 115   Temp (!) 97.1 F (36.2 C) (Axillary)   Resp 18   Ht '4\' 11"'$  (1.499 m)   Wt 68 kg   SpO2 97%   BMI 30.28 kg/m  SpO2: SpO2: 97 % O2  Device: O2 Device: Room Air O2 Flow Rate:   Intake/output summary:  Intake/Output Summary (Last 24 hours) at 07/10/2022 1646 Last data filed at 07/10/2022 1606 Gross per 24 hour  Intake 1028.92 ml  Output 550 ml  Net 478.92 ml   LBM: Last BM Date : 07/09/22 Baseline Weight: Weight: 68 kg Most recent weight: Weight: 68 kg  General: Lying in bed, grimacing at time with pain, able to participate in some conversation though easily falling asleep Eyes: conjunctiva clear, anicteric sclera HENT: normocephalic, atraumatic, dry mucous membranes Cardiovascular: RRR Respiratory: no increase work of breathing noted, not in respiratory distress Abdomen: Tender Extremities: edema in LE b/l Skin: no rashes or lesions on visible skin Neuro: Able to answer simple questioning and participate in conversation though unable to discuss more complex medical matters at time of visit. Additional Data Reviewed: Recent Labs    07/09/22 0446 07/09/22 1524 07/09/22 2000 07/10/22 0612  WBC 10.1  --   --  13.4*  HGB 7.1*   < > 8.7* 7.1*  PLT 149*  --   --  136*  NA 135  --   --  133*  BUN 139*  --   --  156*  CREATININE 2.69*  --   --  3.41*   < > = values in this interval not displayed.    Imaging: Personally reviewed recent imaging.  Palliative Care Assessment and Plan Summary of Established Goals of Care and Medical Treatment Preferences    # Complex medical decision making/goals of care  -Patient able to discuss certain aspects of her medical care though not in extreme detail.  Patient has had multiple hospitalizations due to her chronic medical conditions.  While patient notes being tired of frequent hospitalizations she also notes she wants to "fight" for time with family.  Discussed that finding for time can mean fighting for quantity of time no matter the suffering and in flex versus focusing on quality time.  Patient did admit quality time is more important.  Noted we needed to continue  discussions moving forward about what would best support her idea of quality time.  -Daughter did not believe patient had completed in the ACP documentation no plans to follow-up with her siblings who are primarily involved in patient's daily medical care.  Palliative team planning to continue following and will discuss care with patient's other children as able.  -  Code Status: DNR, daughter did confirm that a few months ago patient had completed DNR documentation  # Symptom management  -Pain, acute abdominal pain in the setting of diverticulitis and GI bleed   -Currently receiving Dilaudid 1  mg every 3 hours as needed.  Patient and daughter noted this seemed to help in alleviating pain.  # Discharge Planning:  TBD    This provider spent a total of 56 minutes providing patient's care.  Includes review of EMR, discussing care with other staff members involved in patient's medical care, obtaining relevant history and information from patient's family, and personal review of imaging and lab work. Greater than 50% of the time was spent counseling and coordinating care related to the above assessment and plan.     Signed by: Chelsea Aus, DO Palliative Care Provider Please contact Palliative Medicine Team phone at [phone] for questions and concerns.

## 2022-07-10 NOTE — Progress Notes (Signed)
PT Cancellation Note  Patient Details Name: [name] MRN: [id number] DOB: 01/08/1941   Cancelled Treatment:    Reason Eval/Treat Not Completed: Pain limiting ability to participate (Pt not able to participate in PT. She is in bed moaning in pain at rest, reports pain is abdominal, noted she had morphine and is not yet due for more. Pt requested a heating pad, RN notified of this request. Will follow.)   Philomena Doheny PT 07/10/2022  Acute Rehabilitation Services  Office [phone]

## 2022-07-10 NOTE — Progress Notes (Signed)
PROGRESS NOTE    [name]  VQX:[id number] DOB: December 04, 1940 DOA: 07/08/2022 PCP: [name], MD    Brief Narrative:   [name] is a 81 y.o. 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 difficile colitis who presented to [facility] ED on 9/30 via EMS with abdominal pain, diarrhea, dark stools.  Patient was seen by her oncologist day prior to admission who follows her for her hemolytic anemia.  She has been unable to wean off of prednisone due to worsening anemia.  Following return from her doctor's appointment, patient reports a sudden urge to defecate with notable melanotic stools around 6 PM.  She reports consistent melanotic stools all night associated with weakness/fatigue.  Also with some generalized abdominal pain but denies fever.  Given persistence of symptoms, patient called EMS and was brought to the ED for further evaluation.  In the ED, temperature 97.6, HR 89, RR 16, BP 133/50, SPO2 100% on room air.  WBC 6.5, hemoglobin 5.6, platelets 148.  Sodium 132, potassium 5.2, chloride 102, CO2 19, glucose 354, BUN 138, creatinine 2.77.  Lipase 51.  AST 23, ALT 16, total bilirubin 0.7.  Lactic acid 2.7.  INR 5.4.  FOBT positive.  C. difficile negative.  CT abdomen/pelvis with marked duodenitis/duodenal diverticulitis no evidence of perforation with questionable mild diverticulitis ascending colon and cecum.  EDP ordered vitamin K for INR reversal and 2 unit PRBCs.  TRH consulted for admission and further evaluation and management of acute GI bleed in setting of supratherapeutic INR.  Assessment & Plan:   Acute blood loss/symptomatic anemia Acute upper GI bleed Hx iron deficiency/anemia of chronic  medical/renal disease Patient presenting to ED with abdominal discomfort, fatigue associated with recurrent melanotic stools.  Hemoglobin 5.6 on admission (recently 8.7 on 9/29 and 9.6 on 9/21).  Additionally BUN elevated at 139 (was 84 on 9/17) and FOBT positive.  Additionally INR elevated at 5.4 which is likely contributing factor.  Etiology likely secondary to upper GI bleed.  Received IV vitamin K per EDP. --[facility] following --Transfused 2 units pRBC on 9/30, 1 unit 10/1 --Hgb 5.4>7.1>8.7>8.7>7.1, will transfuse 2u pRBC today, repeat H&H 2 hours following transfusion -- INR 5.4>2.1>1.5 -- Protonix drip -- Holding Coumadin -- Given cardiac history, goal Hgb >/= 8.0 -- Monitor on telemetry, supportive care  Acute diverticulitis Lactic acidosis CT abdomen/pelvis with marked duodenitis/duodenal diverticulitis, no evidence of perforation with questionable diverticulitis ascending colon/cecum.  Patient was complaining of some abdominal discomfort. --Zosyn --Oral vancomycin twice daily while on antibiotics for C. difficile colitis prophylaxis --Clear liquid diet --CBC daily, repeat lactic acid in a.m.  Paroxysmal atrial fibrillation Hx mechanical aortic valve replacement on Coumadin Supratherapeutic INR INR notably elevated at 5.4 on admission, goal 2.0-3.0.  In the setting of active bleeding as above. --Continue to hold Coumadin/aspirin --INR daily  Autoimmune hemolytic anemia ANCA vasculitis Follows with hematology, Dr. Chryl Heck outpatient.  Maintained on prednisone 20 mg p.o. daily.  Continue outpatient follow-up. --Continue prednisone 20 mg p.o. daily  Acute renal failure on CKD stage IV Metabolic acidosis Baseline creatinine 2.2-2.4. Creatinine on admission 2.77; etiology likely secondary to dehydration versus acute blood loss anemia with ATN given GI bleed as above. --Cr 2.77>2.69>3.41 --s/p 3u pRBC as above; transfusing additional 2 units today --NS at 75  MLS per hour --Avoid  nephrotoxins, renal dose all medications --BMP daily  Type 2 diabetes mellitus Home regimen includes Tresiba 9-14 units subcutaneously daily, NovoLog insulin sliding scale.  Hemoglobin A1c 6.3 on 04/27/2022, well controlled. --SSI for coverage --Cbgs QAC/HS  Chronic diastolic congestive heart failure Home regimen includes metoprolol succinate 12.5 mg p.o. daily, furosemide 40 mg p.o. twice daily, spironolactone 25 mg p.o. daily. -- Continue metoprolol succinate 12.5 mg p.o. daily -- Hold home spironolactone and furosemide for now -- Strict I's and O's and daily weights -- Monitor BMP daily  Essential hypertension -- Continue home amlodipine 10 mg p.o. daily, hydralazine 100 mg p.o. 3 times daily, metoprolol succinate 12.5 mg p.o. daily, -- Hold home clonidine, furosemide, spironolactone for now -- Hold home aspirin in setting of GI bleed  Anxiety: -- Continue alprazolam as needed  Pulmonary artery hypertension -- Continue tadalafil 40 mg p.o. nightly, prednisone 20 mg p.o. daily  Hx C. difficile colitis C. difficile PCR on admission negative. --Continue vancomycin 125 mg p.o. twice daily while on antibiotics for acute diverticulitis as above for prophylaxis  Weakness/debility/deconditioning: -- PT/OT evaluation pending  Social: Patient currently lives alone, appears to have very poor social support with recurrent hospitalizations.  Patient is a DNR.  Overall prognosis is grim/poor -- Palliative care consult   DVT prophylaxis: SCDs Start: 07/09/22 0058    Code Status: DNR Family Communication: No family present at bedside this morning  Disposition Plan:  Level of care: Progressive Status is: Inpatient Remains inpatient appropriate because: Pending EGD and palliative care consultation, needs hemoglobin stability before ready for discharge home.  Overall poor prognosis given significant comorbidities and multiple rehospitalizations    Consultants:  [facility] Palliative  care  Procedures:  None  Antimicrobials:  Zosyn 9/29>> Oral Vancomycin 10/1>>   Subjective: Patient seen examined bedside, lying in bed.  Complaining of abdominal pain.  Yesterday complaining of headache which is now resolved.  Continues with nausea/vomiting.  Hemoglobin continues to decline, 7.1 this morning following 3 units of blood over the last 2 days.  RN reports continued dark tarry stools.  GI plans EGD today.   Denies headache, no dizziness, no chest pain, no shortness of breath, no palpitations, no focal weakness, no cough/congestion, no paresthesias.  No acute events overnight per nursing staff.  Overall very poor prognosis given her comorbidities, active bleeding in the setting of Coumadin use for her mechanical aortic valve.  Objective: Vitals:   07/10/22 0207 07/10/22 0546 07/10/22 1028 07/10/22 1050  BP: (!) 157/55 (!) 159/42 (!) 153/48 (!) 149/58  Pulse: (!) 117 (!) 109 (!) 117 (!) 120  Resp: '20 20 20 '$ (!) 22  Temp: 97.9 F (36.6 C) 98.2 F (36.8 C) 98.2 F (36.8 C) 97.6 F (36.4 C)  TempSrc: Oral Oral Axillary Axillary  SpO2: 99% 98% 100% 99%  Weight:      Height:        Intake/Output Summary (Last 24 hours) at 07/10/2022 1203 Last data filed at 07/10/2022 1159 Gross per 24 hour  Intake 422.34 ml  Output 550 ml  Net -127.66 ml   Filed Weights   07/09/22 1700  Weight: 68 kg    Examination:  Physical Exam: GEN: NAD, alert and oriented x 3, chronically ill in appearance, appears older than stated age HEENT: NCAT, PERRL, EOMI, sclera clear, dry mucous membranes PULM: CTAB w/o wheezes/crackles, normal respiratory effort, on room air CV: Tachycardic, regular rhythm w/o M/G/R GI: abd soft, NTND, NABS, no R/G/M MSK:  2-3+ pitting edema bilateral lower extremities to mid shin, moves all extremities independently NEURO: CN II-XII intact, no focal deficits, sensation to light touch intact PSYCH: normal mood/affect Integumentary: ecchymosis no to bilateral  upper/lower extremities in various stages of healing, lower extremity lymphedema noted, stable; otherwise no other concerning rashes/lesions/wounds noted on exposed skin surfaces.    Data Reviewed: I have personally reviewed following labs and imaging studies  CBC: Recent Labs  Lab 07/07/22 1206 07/08/22 1932 07/09/22 0446 07/09/22 1524 07/09/22 2000 07/10/22 0612  WBC 7.1 6.5 10.1  --   --  13.4*  NEUTROABS 6.5  --  8.0*  --   --   --   HGB 8.7* 5.6* 7.1* 8.7* 8.7* 7.1*  HCT 25.7* 17.6* 21.1* 26.1* 25.4* 21.2*  MCV 91.1 96.7 89.8  --   --  91.8  PLT 136* 148* 149*  --   --  376*   Basic Metabolic Panel: Recent Labs  Lab 07/07/22 1206 07/08/22 1932 07/09/22 0446 07/10/22 0612  NA 133* 132* 135 133*  K 4.6 5.2* 4.6 4.8  CL 101 102 107 105  CO2 25 19* 18* 17*  GLUCOSE 167* 354* 321* 379*  BUN 82* 138* 139* 156*  CREATININE 2.21* 2.77* 2.69* 3.41*  CALCIUM 8.4* 7.9* 7.6* 7.7*  MG  --   --  2.0  --    GFR: Estimated Creatinine Clearance: 10.5 mL/min (A) (by C-G formula based on SCr of 3.41 mg/dL (H)). Liver Function Tests: Recent Labs  Lab 07/07/22 1206 07/08/22 1932 07/09/22 0446  AST '25 23 22  '$ ALT '15 16 16  '$ ALKPHOS 71 53 49  BILITOT 0.6 0.7 0.7  PROT 5.3* 4.3* 4.1*  ALBUMIN 3.3* 2.5* 2.4*   Recent Labs  Lab 07/08/22 1932  LIPASE 51   No results for input(s): "AMMONIA" in the last 168 hours. Coagulation Profile: Recent Labs  Lab 07/07/22 1206 07/08/22 2207 07/09/22 0446 07/10/22 0612  INR 5.4* 5.4* 2.1* 1.5*   Cardiac Enzymes: No results for input(s): "CKTOTAL", "CKMB", "CKMBINDEX", "TROPONINI" in the last 168 hours. BNP (last 3 results) No results for input(s): "PROBNP" in the last 8760 hours. HbA1C: No results for input(s): "HGBA1C" in the last 72 hours. CBG: Recent Labs  Lab 07/09/22 2031 07/10/22 0015 07/10/22 0412 07/10/22 0746 07/10/22 1132  GLUCAP 413* 408* 393* 300* 170*   Lipid Profile: No results for input(s): "CHOL", "HDL",  "LDLCALC", "TRIG", "CHOLHDL", "LDLDIRECT" in the last 72 hours. Thyroid Function Tests: No results for input(s): "TSH", "T4TOTAL", "FREET4", "T3FREE", "THYROIDAB" in the last 72 hours. Anemia Panel: Recent Labs    07/07/22 1207  RETICCTPCT 2.6   Sepsis Labs: Recent Labs  Lab 07/08/22 2215 07/10/22 0612  LATICACIDVEN 2.7* 3.6*    Recent Results (from the past 240 hour(s))  C Difficile Quick Screen w PCR reflex     Status: None   Collection Time: 07/08/22 11:03 PM   Specimen: STOOL  Result Value Ref Range Status   C Diff antigen NEGATIVE NEGATIVE Final   C Diff toxin NEGATIVE NEGATIVE Final   C Diff interpretation No C. difficile detected.  Final    Comment: Performed at [facility], [place] [address] [place], [place] [address]         Radiology Studies: CT ABDOMEN PELVIS WO CONTRAST  Result Date: 07/08/2022 CLINICAL DATA:  Abdominal pain, diarrhea, black stools EXAM: CT ABDOMEN AND PELVIS WITHOUT CONTRAST TECHNIQUE: Multidetector CT imaging of the abdomen and pelvis was performed following the standard  protocol without IV contrast. RADIATION DOSE REDUCTION: This exam was performed according to the departmental dose-optimization program which includes automated exposure control, adjustment of the mA and/or kV according to patient size and/or use of iterative reconstruction technique. COMPARISON:  CT abdomen and pelvis 06/22/2022 FINDINGS: Lower chest: No acute abnormality. Hepatobiliary: No focal liver abnormality is seen. Cholecystectomy. No biliary dilation. Pancreas: Small amount of stranding about the head of the pancreas is favored due to duodenitis/duodenal diverticulitis as the majority of the fluid is centered around the duodenum. Spleen: Normal in size without focal abnormality. Adrenals/Urinary Tract: Adrenal glands are unremarkable. Kidneys are normal, without renal calculi, focal lesion, or hydronephrosis. Bladder is unremarkable. Stomach/Bowel: Duodenal  diverticulum. Marked wall thickening about the second and third portions of the duodenum with adjacent hazy mesenteric fat stranding. Free fluid about the duodenum tracks inferiorly in the right anterior pararenal fascia. No free intraperitoneal air. Marked colonic diverticulosis. There is mild wall thickening about the ascending colon and cecum. Vascular/Lymphatic: Aortic atherosclerosis. No enlarged abdominal or pelvic lymph nodes. Reproductive: Status post hysterectomy. No adnexal masses. Other: Increased nonspecific subcutaneous stranding in the lower anterior abdominal subcutaneous fat. Subcutaneous gas in the anterior abdominal wall favored due to injections. Edema within the lateral thighs bilaterally. Musculoskeletal: Advanced degenerative arthritis right hip. Unchanged superior endplate compression of L4. No acute osseous abnormality. IMPRESSION: Marked duodenitis/duodenal diverticulitis. No evidence of perforation. Question additional mild diverticulitis of the ascending colon and cecum. Nonspecific hazy subcutaneous fat stranding within the anterior abdominal wall and bilateral thighs. Small amount of subcutaneous gas in the right anterior abdomen favored to be due to injections. Clinical correlation recommended. Aortic Atherosclerosis (ICD10-I70.0). Electronically Signed   By: Placido Sou M.D.   On: 07/08/2022 20:30        Scheduled Meds:  sodium chloride   Intravenous Once   sodium chloride   Intravenous Once   ALPRAZolam  0.5 mg Oral QHS   amLODipine  10 mg Oral Daily   hydrALAZINE  100 mg Oral TID   insulin aspart  0-20 Units Subcutaneous TID WC   insulin glargine-yfgn  10 Units Subcutaneous Daily   predniSONE  20 mg Oral Q breakfast   tadalafil  40 mg Oral QHS   vancomycin  125 mg Oral BID   Continuous Infusions:  sodium chloride     pantoprazole 8 mg/hr (07/09/22 1803)   piperacillin-tazobactam (ZOSYN)  IV 2.25 g (07/10/22 0628)     LOS: 2 days    Time spent: 58  minutes spent on chart review, discussion with nursing staff, consultants, updating family and interview/physical exam; more than 50% of that time was spent in counseling and/or coordination of care.    Damiah Mcdonald J [place] DO [facility] Available via Epic secure chat 7am-7pm After these hours, please refer to coverage provider listed on [email or web address] 07/10/2022, 12:03 PM

## 2022-07-10 NOTE — Progress Notes (Signed)
Mobility Specialist Cancellation/Refusal Note:    07/10/22 1537  Mobility  Activity Refused mobility    Reason for Cancellation/Refusal: Pt declined mobility at this time. Pt in pain. Will check back as schedule permits.    [facility]

## 2022-07-10 NOTE — Inpatient Diabetes Management (Signed)
Inpatient Diabetes Program Recommendations  AACE/ADA: New Consensus Statement on Inpatient Glycemic Control (2015)  Target Ranges:  Prepandial:   less than 140 mg/dL      Peak postprandial:   less than 180 mg/dL (1-2 hours)      Critically ill patients:  140 - 180 mg/dL   Lab Results  Component Value Date   GLUCAP 300 (H) 07/10/2022   HGBA1C 6.3 (H) 04/27/2022    Review of Glycemic Control  Latest Reference Range & Units 07/09/22 20:31 07/10/22 00:15 07/10/22 04:12 07/10/22 07:46  Glucose-Capillary 70 - 99 mg/dL 413 (H) 408 (H) 393 (H) 300 (H)  (H): Data is abnormally high Diabetes history: Type 2 DM Outpatient Diabetes medications: Novolog 10 units TID, Tresiba 9-14 units QHS Current orders for Inpatient glycemic control: Semglee 10 units QD, Novolog 0-20 units TID Prednisone 20 mg QD  Inpatient Diabetes Program Recommendations:    In the setting of steroids, consider adding Novolog 3 units TID (assuming patient consuming >50% of meals).   Thanks, Bronson Curb, MSN, RNC-OB Diabetes Coordinator [phone] (8a-5p)

## 2022-07-11 DIAGNOSIS — R600 Localized edema: Secondary | ICD-10-CM | POA: Diagnosis not present

## 2022-07-11 DIAGNOSIS — R52 Pain, unspecified: Secondary | ICD-10-CM

## 2022-07-11 DIAGNOSIS — D649 Anemia, unspecified: Secondary | ICD-10-CM | POA: Diagnosis not present

## 2022-07-11 DIAGNOSIS — K922 Gastrointestinal hemorrhage, unspecified: Secondary | ICD-10-CM | POA: Diagnosis not present

## 2022-07-11 DIAGNOSIS — Z7189 Other specified counseling: Secondary | ICD-10-CM

## 2022-07-11 DIAGNOSIS — D591 Autoimmune hemolytic anemia, unspecified: Secondary | ICD-10-CM | POA: Diagnosis not present

## 2022-07-11 LAB — TYPE AND SCREEN
ABO/RH(D): A POS
Antibody Screen: NEGATIVE
Unit division: 0
Unit division: 0
Unit division: 0
Unit division: 0
Unit division: 0

## 2022-07-11 LAB — BPAM RBC
Blood Product Expiration Date: 202310192359
Blood Product Expiration Date: 202310262359
Blood Product Expiration Date: 202310262359
Blood Product Expiration Date: 202310262359
Blood Product Expiration Date: 202310262359
ISSUE DATE / TIME: 202310010017
ISSUE DATE / TIME: 202310010431
ISSUE DATE / TIME: 202310010952
ISSUE DATE / TIME: 202310021030
ISSUE DATE / TIME: 202310021310
Unit Type and Rh: 6200
Unit Type and Rh: 6200
Unit Type and Rh: 6200
Unit Type and Rh: 6200
Unit Type and Rh: 6200

## 2022-07-11 LAB — PROTIME-INR
INR: 1.5 — ABNORMAL HIGH (ref 0.8–1.2)
Prothrombin Time: 18.2 seconds — ABNORMAL HIGH (ref 11.4–15.2)

## 2022-07-11 LAB — BASIC METABOLIC PANEL
Anion gap: 10 (ref 5–15)
BUN: 141 mg/dL — ABNORMAL HIGH (ref 8–23)
CO2: 18 mmol/L — ABNORMAL LOW (ref 22–32)
Calcium: 8.1 mg/dL — ABNORMAL LOW (ref 8.9–10.3)
Chloride: 110 mmol/L (ref 98–111)
Creatinine, Ser: 3.29 mg/dL — ABNORMAL HIGH (ref 0.44–1.00)
GFR, Estimated: 13 mL/min — ABNORMAL LOW (ref >60)
Glucose, Bld: 229 mg/dL — ABNORMAL HIGH (ref 70–99)
Potassium: 4.4 mmol/L (ref 3.5–5.1)
Sodium: 138 mmol/L (ref 135–145)

## 2022-07-11 LAB — CBC
HCT: 27.1 % — ABNORMAL LOW (ref 36.0–46.0)
Hemoglobin: 9.1 g/dL — ABNORMAL LOW (ref 12.0–15.0)
MCH: 31.2 pg (ref 26.0–34.0)
MCHC: 33.6 g/dL (ref 30.0–36.0)
MCV: 92.8 fL (ref 80.0–100.0)
Platelets: 104 10*3/uL — ABNORMAL LOW (ref 150–400)
RBC: 2.92 MIL/uL — ABNORMAL LOW (ref 3.87–5.11)
RDW: 17 % — ABNORMAL HIGH (ref 11.5–15.5)
WBC: 9.5 10*3/uL (ref 4.0–10.5)
nRBC: 2 % — ABNORMAL HIGH (ref 0.0–0.2)

## 2022-07-11 LAB — GLUCOSE, CAPILLARY
Glucose-Capillary: 106 mg/dL — ABNORMAL HIGH (ref 70–99)
Glucose-Capillary: 145 mg/dL — ABNORMAL HIGH (ref 70–99)
Glucose-Capillary: 198 mg/dL — ABNORMAL HIGH (ref 70–99)
Glucose-Capillary: 209 mg/dL — ABNORMAL HIGH (ref 70–99)
Glucose-Capillary: 235 mg/dL — ABNORMAL HIGH (ref 70–99)
Glucose-Capillary: 253 mg/dL — ABNORMAL HIGH (ref 70–99)

## 2022-07-11 LAB — LACTIC ACID, PLASMA: Lactic Acid, Venous: 0.8 mmol/L (ref 0.5–1.9)

## 2022-07-11 LAB — HEMOGLOBIN AND HEMATOCRIT, BLOOD
HCT: 25.7 % — ABNORMAL LOW (ref 36.0–46.0)
HCT: 27.6 % — ABNORMAL LOW (ref 36.0–46.0)
Hemoglobin: 8.6 g/dL — ABNORMAL LOW (ref 12.0–15.0)
Hemoglobin: 9.4 g/dL — ABNORMAL LOW (ref 12.0–15.0)

## 2022-07-11 LAB — MAGNESIUM: Magnesium: 2.3 mg/dL (ref 1.7–2.4)

## 2022-07-11 MED ORDER — PREDNISONE 10 MG PO TABS: 10.0000 mg | ORAL_TABLET | Freq: Every day | ORAL | Status: DC

## 2022-07-11 MED ORDER — ACETAMINOPHEN 500 MG PO TABS
1000.0000 mg | ORAL_TABLET | Freq: Three times a day (TID) | ORAL | Status: DC
  Administered 2022-07-11 – 2022-07-14 (×9): 1000 mg via ORAL
  Filled 2022-07-11 (×10): qty 2

## 2022-07-11 MED ORDER — HEPARIN SOD (PORK) LOCK FLUSH 100 UNIT/ML IV SOLN: 250.0000 [IU] | INTRAVENOUS | Status: DC | PRN

## 2022-07-11 MED ORDER — SODIUM CHLORIDE 0.9% FLUSH: 10.0000 mL | INTRAVENOUS | Status: DC | PRN

## 2022-07-11 MED ORDER — SODIUM CHLORIDE 0.9% FLUSH: 3.0000 mL | INTRAVENOUS | Status: DC | PRN

## 2022-07-11 MED ORDER — METOPROLOL SUCCINATE ER 25 MG PO TB24
12.5000 mg | ORAL_TABLET | Freq: Two times a day (BID) | ORAL | Status: DC
  Administered 2022-07-11 – 2022-07-14 (×7): 12.5 mg via ORAL
  Filled 2022-07-11 (×7): qty 1

## 2022-07-11 MED ORDER — HYDROMORPHONE HCL 1 MG/ML PO LIQD
1.0000 mg | Freq: Four times a day (QID) | ORAL | Status: DC | PRN
  Administered 2022-07-11: 1 mg via ORAL
  Administered 2022-07-13 – 2022-07-14 (×2): 2 mg via ORAL
  Filled 2022-07-11: qty 2
  Filled 2022-07-11: qty 1
  Filled 2022-07-11: qty 2

## 2022-07-11 MED ORDER — INSULIN ASPART 100 UNIT/ML IJ SOLN
3.0000 [IU] | Freq: Three times a day (TID) | INTRAMUSCULAR | Status: DC
  Administered 2022-07-11 – 2022-07-14 (×4): 3 [IU] via SUBCUTANEOUS

## 2022-07-11 MED ORDER — SODIUM CHLORIDE 0.9 % IV SOLN: INTRAVENOUS | Status: DC

## 2022-07-11 MED ORDER — INSULIN GLARGINE-YFGN 100 UNIT/ML ~~LOC~~ SOLN
20.0000 [IU] | Freq: Every day | SUBCUTANEOUS | Status: DC
  Administered 2022-07-11 – 2022-07-12 (×2): 20 [IU] via SUBCUTANEOUS
  Filled 2022-07-11 (×3): qty 0.2

## 2022-07-11 MED ORDER — HEPARIN SOD (PORK) LOCK FLUSH 100 UNIT/ML IV SOLN: 500.0000 [IU] | Freq: Every day | INTRAVENOUS | Status: DC | PRN

## 2022-07-11 MED ORDER — PREDNISONE 5 MG PO TABS: 5.0000 mg | ORAL_TABLET | Freq: Every day | ORAL | Status: DC

## 2022-07-11 MED ORDER — PREDNISONE 5 MG PO TABS
15.0000 mg | ORAL_TABLET | Freq: Every day | ORAL | Status: DC
  Administered 2022-07-12 – 2022-07-14 (×3): 15 mg via ORAL
  Filled 2022-07-11 (×3): qty 1

## 2022-07-11 NOTE — Progress Notes (Signed)
[facility] Gastroenterology Progress Note  [name] 81 y.o. [birth year]   Subjective: Patient seen and examined laying in bed.  Notes one black bowel movements today.  She is feeling improved overall compared to yesterday.  Abdominal pain much improved.   Objective: Vital signs in last 24 hours: Vitals:   07/11/22 0900 07/11/22 1200  BP: (!) 162/64 (!) 142/46  Pulse: (!) 111 (!) 105  Resp: 18 17  Temp: 98 F (36.7 C) (!) 97.5 F (36.4 C)  SpO2: 99% 99%    Physical Exam:  General:  Alert, cooperative, no distress, appears stated age  Head:  Normocephalic, without obvious abnormality, atraumatic  Eyes:  Anicteric sclera, EOM's intact  Lungs:   Clear to auscultation bilaterally, respirations unlabored  Heart:  Regular rate and rhythm, S1, S2 normal  Abdomen:   Soft, mild tenderness to epigastric region, bowel sounds active all four quadrants,  no masses,   Extremities: Extremities normal, atraumatic, no  edema  Pulses: 2+ and symmetric    Lab Results: Recent Labs    07/09/22 0446 07/10/22 0612 07/11/22 0410  NA 135 133* 138  K 4.6 4.8 4.4  CL 107 105 110  CO2 18* 17* 18*  GLUCOSE 321* 379* 229*  BUN 139* 156* 141*  CREATININE 2.69* 3.41* 3.29*  CALCIUM 7.6* 7.7* 8.1*  MG 2.0  --  2.3   Recent Labs    07/08/22 1932 07/09/22 0446  AST 23 22  ALT 16 16  ALKPHOS 53 49  BILITOT 0.7 0.7  PROT 4.3* 4.1*  ALBUMIN 2.5* 2.4*   Recent Labs    07/09/22 0446 07/09/22 1524 07/10/22 0612 07/11/22 0000 07/11/22 0410 07/11/22 1339  WBC 10.1  --  13.4*  --  9.5  --   NEUTROABS 8.0*  --   --   --   --   --   HGB 7.1*   < > 7.1*   < > 9.1* 8.6*  HCT 21.1*   < > 21.2*   < > 27.1* 25.7*  MCV 89.8  --  91.8  --  92.8  --   PLT 149*  --  136*  --  104*  --    < > = values in this interval not displayed.   Recent Labs    07/10/22 0612 07/11/22 0410  LABPROT 17.9* 18.2*  INR 1.5* 1.5*    Assessment Melena Anemia Chronic anticoagulation A-fib Abdominal  pain  Abdominal pain much improved on Dilaudid.  Patient overall feeling improved.  She did have 1 episode of melena today. Hgb 8.6 (9.1) (patient received 2 units packed red blood cells yesterday).  EGD was canceled due to elevated heart rate and abdominal pain.  CT AP 07/08/2022 Marked duodenitis/duodenal diverticulitis, no evidence of perforation  Continue conservative measures and pain control with findings of duodenal diverticulitis consider holding EGD at this time.  Would consider EGD for further evaluation of melena after inflammation has improved possible Friday.   Plan: Continue to monitor hemoglobin, transfuse if less than 7 Consider tentative EGD Friday for evaluation of melena and anemia if pain continues to be improved.  Continue vancomycin 125 mg twice daily Continue Zosyn 2.25 g IV every 8 hours Continue Protonix '8mg'$ /h drip Continue antiemetics and pain control as needed [facility] will follow Charlott Rakes PA-C 07/11/2022, 3:25 PM  Contact #  [phone]

## 2022-07-11 NOTE — Progress Notes (Signed)
PROGRESS NOTE    RONNAE KASER  IRW:[id number] DOB: [birth year] DOA: 07/08/2022 PCP: [name], MD    Brief Narrative:   [name] is a 81 y.o. 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 difficile colitis who presented to [facility] ED on 9/30 via EMS with abdominal pain, diarrhea, dark stools.  Patient was seen by her oncologist day prior to admission who follows her for her hemolytic anemia.  She has been unable to wean off of prednisone due to worsening anemia.  Following return from her doctor's appointment, patient reports a sudden urge to defecate with notable melanotic stools around 6 PM.  She reports consistent melanotic stools all night associated with weakness/fatigue.  Also with some generalized abdominal pain but denies fever.  Given persistence of symptoms, patient called EMS and was brought to the ED for further evaluation.  In the ED, temperature 97.6, HR 89, RR 16, BP 133/50, SPO2 100% on room air.  WBC 6.5, hemoglobin 5.6, platelets 148.  Sodium 132, potassium 5.2, chloride 102, CO2 19, glucose 354, BUN 138, creatinine 2.77.  Lipase 51.  AST 23, ALT 16, total bilirubin 0.7.  Lactic acid 2.7.  INR 5.4.  FOBT positive.  C. difficile negative.  CT abdomen/pelvis with marked duodenitis/duodenal diverticulitis no evidence of perforation with questionable mild diverticulitis ascending colon and cecum.  EDP ordered vitamin K for INR reversal and 2 unit PRBCs.  TRH consulted for admission and further evaluation and management of acute GI bleed in setting of supratherapeutic INR.  Assessment & Plan:   Acute blood loss/symptomatic anemia Acute upper GI bleed Hx iron deficiency/anemia of chronic  medical/renal disease Patient presenting to ED with abdominal discomfort, fatigue associated with recurrent melanotic stools.  Hemoglobin 5.6 on admission (recently 8.7 on 9/29 and 9.6 on 9/21).  Additionally BUN elevated at 139 (was 84 on 9/17) and FOBT positive.  Additionally INR elevated at 5.4 which is likely contributing factor.  Etiology likely secondary to upper GI bleed.  Received IV vitamin K per EDP. --[facility] following --Transfused 2 units pRBC on 9/30, 1 unit 10/1, 2u 10/2 --Hgb 5.4>7.1>8.7>8.7>7.1>9.4>9.1 -- INR 5.4>2.1>1.5>1.5 -- Protonix drip -- Holding Coumadin -- Given cardiac history, goal Hgb >/= 8.0 -- Monitor on telemetry, supportive care  Acute diverticulitis Lactic acidosis CT abdomen/pelvis with marked duodenitis/duodenal diverticulitis, no evidence of perforation with questionable diverticulitis ascending colon/cecum.  Patient was complaining of some abdominal discomfort. --Zosyn --Oral vancomycin twice daily while on antibiotics for C. difficile colitis prophylaxis --Clear liquid diet --CBC daily  Paroxysmal atrial fibrillation Hx mechanical aortic valve replacement on Coumadin Supratherapeutic INR INR notably elevated at 5.4 on admission, goal 2.0-3.0.  In the setting of active bleeding as above. --Continue to hold Coumadin/aspirin --INR daily  Autoimmune hemolytic anemia ANCA vasculitis Follows with hematology, Dr. Chryl Heck outpatient.  Maintained on prednisone 20 mg p.o. daily.  Continue outpatient follow-up. --Continue prednisone taper per oncology  Acute renal failure on CKD stage IV Metabolic acidosis Baseline creatinine 2.2-2.4. Creatinine on admission 2.77; etiology likely secondary to dehydration versus acute blood loss anemia with ATN given GI bleed as above.  Refused sodium bicarb in the past due to "side effects". --Cr 2.77>2.69>3.41>3.29 --s/p 5u pRBC as above --NS at 75 mL/h --Avoid nephrotoxins, renal dose all medications --BMP  daily  Type 2 diabetes mellitus Home regimen includes Tresiba 9-14 units subcutaneously daily, NovoLog insulin sliding scale.  Hemoglobin A1c 6.3 on 04/27/2022, well controlled. --SSI for coverage --Cbgs QAC/HS  Chronic diastolic congestive heart failure Home regimen includes metoprolol succinate 12.5 mg p.o. daily, furosemide 40 mg p.o. twice daily, spironolactone 25 mg p.o. daily. -- Continue metoprolol succinate 12.5 mg p.o. BID -- Hold home spironolactone and furosemide for now -- Strict I's and O's and daily weights -- Monitor BMP daily  Essential hypertension -- Continue home amlodipine 10 mg p.o. daily, hydralazine 100 mg p.o. 3 times daily, metoprolol succinate 12.5 mg p.o. daily, -- Hold home clonidine, furosemide, spironolactone for now -- Hold home aspirin in setting of GI bleed  Anxiety: -- Continue alprazolam as needed  Pulmonary artery hypertension -- Continue tadalafil 40 mg p.o. nightly, prednisone 20 mg p.o. daily  Hx C. difficile colitis C. difficile PCR on admission negative. --Continue vancomycin 125 mg p.o. twice daily while on antibiotics for acute diverticulitis as above for prophylaxis  Weakness/debility/deconditioning: -- PT/OT evaluation pending  Social: Patient currently lives alone, appears to have very poor social support with recurrent hospitalizations.  Patient is a DNR.  Overall prognosis is grim/poor -- Palliative care consult   DVT prophylaxis: SCDs Start: 07/09/22 0058    Code Status: DNR Family Communication: No family present at bedside this morning  Disposition Plan:  Level of care: Progressive Status is: Inpatient Remains inpatient appropriate because: Awaiting to see if patient will be getting EGD, needs hemoglobin stability before ready for discharge home.  Overall poor prognosis given significant comorbidities and multiple rehospitalizations.  Palliative care following    Consultants:  [facility] Palliative care  Procedures:   None  Antimicrobials:  Zosyn 9/29>> Oral Vancomycin 10/1>>   Subjective: Patient seen examined bedside, sitting in bedside chair, comfortable.  Son present.  Abdominal pain relatively resolved.  Asking if she could eat something more today.  Hemoglobin now appears to have stabilized.  Awaiting further GI recommendations.  Palliative care following given her overall poor prognosis.  No further bowel movements reported today.  Denies headache, no dizziness, no chest pain, no shortness of breath, no palpitations, no focal weakness, no cough/congestion, no paresthesias.  No acute events overnight per nursing staff.  Overall very poor prognosis given her comorbidities, active bleeding in the setting of Coumadin use for her mechanical aortic valve.  Objective: Vitals:   07/11/22 0457 07/11/22 0627 07/11/22 0900 07/11/22 1200  BP:  (!) 140/56 (!) 162/64 (!) 142/46  Pulse:  (!) 124 (!) 111 (!) 105  Resp:  '18 18 17  '$ Temp:  98.1 F (36.7 C) 98 F (36.7 C) (!) 97.5 F (36.4 C)  TempSrc:  Oral Oral Oral  SpO2:  100% 99% 99%  Weight: 63.4 kg     Height:        Intake/Output Summary (Last 24 hours) at 07/11/2022 1251 Last data filed at 07/11/2022 0600 Gross per 24 hour  Intake 2137.6 ml  Output --  Net 2137.6 ml   Filed Weights   07/09/22 1700 07/11/22 0457  Weight: 68 kg 63.4 kg    Examination:  Physical Exam: GEN: NAD, alert and oriented x 3, chronically ill in appearance, appears older than stated age HEENT: NCAT, PERRL, EOMI, sclera clear, dry mucous membranes PULM: CTAB w/o wheezes/crackles, normal respiratory effort, on room air CV: Tachycardic, regular rhythm w/o M/G/R GI: abd soft, NTND, NABS, no R/G/M MSK: 2-3+ pitting edema bilateral lower extremities to  mid shin, moves all extremities independently NEURO: CN II-XII intact, no focal deficits, sensation to light touch intact PSYCH: normal mood/affect Integumentary: ecchymosis no to bilateral upper/lower extremities in  various stages of healing, lower extremity lymphedema noted, stable; otherwise no other concerning rashes/lesions/wounds noted on exposed skin surfaces.    Data Reviewed: I have personally reviewed following labs and imaging studies  CBC: Recent Labs  Lab 07/07/22 1206 07/08/22 1932 07/09/22 0446 07/09/22 1524 07/09/22 2000 07/10/22 0612 07/11/22 0000 07/11/22 0410  WBC 7.1 6.5 10.1  --   --  13.4*  --  9.5  NEUTROABS 6.5  --  8.0*  --   --   --   --   --   HGB 8.7* 5.6* 7.1* 8.7* 8.7* 7.1* 9.4* 9.1*  HCT 25.7* 17.6* 21.1* 26.1* 25.4* 21.2* 27.6* 27.1*  MCV 91.1 96.7 89.8  --   --  91.8  --  92.8  PLT 136* 148* 149*  --   --  136*  --  622*   Basic Metabolic Panel: Recent Labs  Lab 07/07/22 1206 07/08/22 1932 07/09/22 0446 07/10/22 0612 07/11/22 0410  NA 133* 132* 135 133* 138  K 4.6 5.2* 4.6 4.8 4.4  CL 101 102 107 105 110  CO2 25 19* 18* 17* 18*  GLUCOSE 167* 354* 321* 379* 229*  BUN 82* 138* 139* 156* 141*  CREATININE 2.21* 2.77* 2.69* 3.41* 3.29*  CALCIUM 8.4* 7.9* 7.6* 7.7* 8.1*  MG  --   --  2.0  --  2.3   GFR: Estimated Creatinine Clearance: 10.5 mL/min (A) (by C-G formula based on SCr of 3.29 mg/dL (H)). Liver Function Tests: Recent Labs  Lab 07/07/22 1206 07/08/22 1932 07/09/22 0446  AST '25 23 22  '$ ALT '15 16 16  '$ ALKPHOS 71 53 49  BILITOT 0.6 0.7 0.7  PROT 5.3* 4.3* 4.1*  ALBUMIN 3.3* 2.5* 2.4*   Recent Labs  Lab 07/08/22 1932  LIPASE 51   No results for input(s): "AMMONIA" in the last 168 hours. Coagulation Profile: Recent Labs  Lab 07/07/22 1206 07/08/22 2207 07/09/22 0446 07/10/22 0612 07/11/22 0410  INR 5.4* 5.4* 2.1* 1.5* 1.5*   Cardiac Enzymes: No results for input(s): "CKTOTAL", "CKMB", "CKMBINDEX", "TROPONINI" in the last 168 hours. BNP (last 3 results) No results for input(s): "PROBNP" in the last 8760 hours. HbA1C: No results for input(s): "HGBA1C" in the last 72 hours. CBG: Recent Labs  Lab 07/10/22 2033  07/11/22 0004 07/11/22 0443 07/11/22 0804 07/11/22 1133  GLUCAP 206* 198* 209* 235* 253*   Lipid Profile: No results for input(s): "CHOL", "HDL", "LDLCALC", "TRIG", "CHOLHDL", "LDLDIRECT" in the last 72 hours. Thyroid Function Tests: No results for input(s): "TSH", "T4TOTAL", "FREET4", "T3FREE", "THYROIDAB" in the last 72 hours. Anemia Panel: No results for input(s): "VITAMINB12", "FOLATE", "FERRITIN", "TIBC", "IRON", "RETICCTPCT" in the last 72 hours.  Sepsis Labs: Recent Labs  Lab 07/08/22 2215 07/10/22 0612 07/11/22 0410  LATICACIDVEN 2.7* 3.6* 0.8    Recent Results (from the past 240 hour(s))  C Difficile Quick Screen w PCR reflex     Status: None   Collection Time: 07/08/22 11:03 PM   Specimen: STOOL  Result Value Ref Range Status   C Diff antigen NEGATIVE NEGATIVE Final   C Diff toxin NEGATIVE NEGATIVE Final   C Diff interpretation No C. difficile detected.  Final    Comment: Performed at [facility], [place] [address] [place], [place] [address]  Radiology Studies: No results found.      Scheduled Meds:  sodium chloride   Intravenous Once   acetaminophen  1,000 mg Oral TID   ALPRAZolam  0.5 mg Oral QHS   amLODipine  10 mg Oral Daily   hydrALAZINE  100 mg Oral TID   insulin aspart  0-20 Units Subcutaneous TID WC   insulin aspart  3 Units Subcutaneous TID WC   insulin glargine-yfgn  20 Units Subcutaneous Daily   metoprolol succinate  12.5 mg Oral BID   [START ON 07/17/2022] predniSONE  10 mg Oral Q breakfast   [START ON 07/12/2022] predniSONE  15 mg Oral Q breakfast   [START ON 07/22/2022] predniSONE  5 mg Oral Q breakfast   tadalafil  40 mg Oral QHS   vancomycin  125 mg Oral BID   Continuous Infusions:  sodium chloride 75 mL/hr at 07/11/22 0805   pantoprazole 8 mg/hr (07/11/22 1148)   piperacillin-tazobactam (ZOSYN)  IV 2.25 g (07/11/22 0517)     LOS: 3 days    Time spent: 58 minutes spent on chart review, discussion  with nursing staff, consultants, updating family and interview/physical exam; more than 50% of that time was spent in counseling and/or coordination of care.    Easten Maceachern J [place] DO [facility] Available via Epic secure chat 7am-7pm After these hours, please refer to coverage provider listed on [email or web address] 07/11/2022, 12:51 PM

## 2022-07-11 NOTE — Progress Notes (Signed)
   07/11/22 0248  Assess: MEWS Score  Temp 97.7 F (36.5 C)  BP (!) 152/47  MAP (mmHg) 79  Pulse Rate (!) 120  Resp 18  Level of Consciousness Alert  SpO2 98 %  O2 Device Nasal Cannula  Patient Activity (if Appropriate) In bed  O2 Flow Rate (L/min) 2 L/min  Assess: MEWS Score  MEWS Temp 0  MEWS Systolic 0  MEWS Pulse 2  MEWS RR 0  MEWS LOC 0  MEWS Score 2  MEWS Score Color Yellow  Assess: if the MEWS score is Yellow or Red  Were vital signs taken at a resting state? Yes  Focused Assessment No change from prior assessment  Does the patient meet 2 or more of the SIRS criteria? Yes  Does the patient have a confirmed or suspected source of infection? Yes  Provider and Rapid Response Notified? Yes  MEWS guidelines implemented *See Row Information* No, previously yellow, continue vital signs every 4 hours  Treat  MEWS Interventions Administered prn meds/treatments;Escalated (See documentation below)  Pain Scale 0-10  Pain Score 7  Pain Type Acute pain  Pain Location Abdomen  Pain Descriptors / Indicators Aching  Pain Frequency Intermittent  Pain Onset Progressive  Patients Stated Pain Goal 4  Pain Intervention(s) Medication (See eMAR)  Multiple Pain Sites No  Escalate  MEWS: Escalate Yellow: discuss with charge nurse/RN and consider discussing with provider and RRT  Notify: Charge Nurse/RN  Name of Charge Nurse/RN Notified Benedict Needy, RN  Date Charge Nurse/RN Notified 07/11/22  Time Charge Nurse/RN Notified 0256  Notify: Provider  Provider Name/Title Gershon Cull, NP  Date Provider Notified 07/11/22  Time Provider Notified [phone]  Method of Notification Page  Notification Reason Other (Comment) (patient's elevated HR)  Provider response No new orders (none at this time)  Date of Provider Response 07/11/22  Time of Provider Response 0251  Notify: Rapid Response  Name of Rapid Response RN Notified Barnet Glasgow, RN  Date Rapid Response Notified 07/11/22  Time  Rapid Response Notified 0252  Document  Progress note created (see row info) Yes  Assess: SIRS CRITERIA  SIRS Temperature  0  SIRS Pulse 1  SIRS Respirations  0  SIRS WBC 1  SIRS Score Sum  2

## 2022-07-11 NOTE — Care Management Important Message (Signed)
Important Message  Patient Details IM Letter given Name: [name] MRN: [id number] Date of Birth: [birth year]   Medicare Important Message Given:  Yes     Kerin Salen 07/11/2022, 11:24 AM

## 2022-07-11 NOTE — Progress Notes (Signed)
Notified on call provider due to patient's HR still jumping around and mostly in the 120's and 130's. On call provider put an order to check patient's magnesium level. Patient's magnesium level, potassium level, WBC level, and Lactic acid came back normal. Patient's heart rate was like this yesterday. On call provider instructed nurse to continue to monitor. Will pass along to dayshift nurse.

## 2022-07-11 NOTE — Progress Notes (Signed)
Notified on call provider that patient's heart rate was hanging around the 120's to 130's in A. Fib. On call provider suggested applying 2 liters of oxygen to see if it would help with the heart rate. Applied two liters of oxygen to patient. Will continue to monitor patient.

## 2022-07-11 NOTE — Progress Notes (Signed)
I had a discussion with [facility] hematology Dr Redmond Pulling. He suggested to continue epo and wean off steroids. She is now admitted with ? GI bleed, gastro following her. I will change the dose of prednisone and follow her hospitalization.

## 2022-07-11 NOTE — Evaluation (Signed)
Occupational Therapy Evaluation Patient Details Name: [name] MRN: [id number] DOB: [birth year] Today's Date: 07/11/2022   History of Present Illness 81 y.o. 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 difficile colitis. Recent admission 9/14-9/17/23 for a UTI. Pt presented to [facility] ED on 9/30 via EMS with abdominal pain, diarrhea, dark stools. Dx of upper GIB, ABLA   Clinical Impression   This 81 yo female admitted with above presents to acute OT with PLOF of taking care of herself during the day with her basic ADLs at a RW level and someone staying with her at night. Currently she is setup/S-Mod A for basic ADLs and fatigues easily. She will continue to benefit from acute OT with follow at SNF v. HHOT dependent upon recommended 24 S/A at home. We will continue to follow.     Recommendations for follow up therapy are one component of a multi-disciplinary discharge planning process, led by the attending physician.  Recommendations may be updated based on patient status, additional functional criteria and insurance authorization.   Follow Up Recommendations  Skilled nursing-short term rehab (<3 hours/day) (or HHOT dependent on family/hired A at home (recommend 24/7))    Assistance Recommended at Discharge Frequent or constant Supervision/Assistance  Patient can return home with the following A little help with walking and/or transfers;A lot of help with bathing/dressing/bathroom;Assistance with cooking/housework;Help with stairs or ramp for entrance;Assist for transportation    Functional Status Assessment  Patient has had a recent decline in their functional status and demonstrates the ability to make  significant improvements in function in a reasonable and predictable amount of time.  Equipment Recommendations  None recommended by OT       Precautions / Restrictions Precautions Precautions: Fall Precaution Comments: ESBBL, VRE Restrictions Weight Bearing Restrictions: No      Mobility Bed Mobility Overal bed mobility: Needs Assistance Bed Mobility: Supine to Sit, Sit to Supine     Supine to sit: Supervision, HOB elevated Sit to supine: Supervision, HOB elevated   General bed mobility comments: increased time. hob elevated. use of bedrails. cues provided.    Transfers Overall transfer level: Needs assistance Equipment used: Rolling walker (2 wheels) Transfers: Sit to/from Stand Sit to Stand: Min assist, From elevated surface           General transfer comment: 2 attempts to get to standing initially. cues for safety, technique, hand placement. assist to power up, stabilize, control descent.      Balance Overall balance assessment: Needs assistance Sitting-balance support: No upper extremity supported, Feet supported Sitting balance-Leahy Scale: Fair     Standing balance support: Bilateral upper extremity supported, Reliant on assistive device for balance Standing balance-Leahy Scale: Poor                             ADL either performed or assessed with clinical judgement   ADL Overall ADL's : Needs assistance/impaired Eating/Feeding: Independent;Sitting   Grooming: Set up;Sitting   Upper Body Bathing: Set up;Sitting   Lower Body Bathing: Moderate assistance Lower Body Bathing Details (indicate cue type and reason): min A sit<>stand Upper Body Dressing : Set up;Sitting   Lower Body Dressing: Moderate assistance Lower Body Dressing Details (indicate cue type and reason): min A sit<>stand Toilet Transfer:  Minimal assistance;+2 for safety/equipment;Ambulation;Rolling walker (2 wheels);Grab bars;Comfort height toilet   Toileting- Clothing  Manipulation and Hygiene: Minimal assistance;Sit to/from stand               Vision Patient Visual Report: No change from baseline              Pertinent Vitals/Pain Pain Assessment Pain Assessment: Faces Faces Pain Scale: Hurts little more Pain Location: abdomen Pain Descriptors / Indicators: Discomfort, Sore Pain Intervention(s): Limited activity within patient's tolerance, Monitored during session, Repositioned     Hand Dominance Right   Extremity/Trunk Assessment Upper Extremity Assessment Upper Extremity Assessment: Generalized weakness   Lower Extremity Assessment Lower Extremity Assessment: Generalized weakness   Cervical / Trunk Assessment Cervical / Trunk Assessment: Normal   Communication Communication Communication: No difficulties   Cognition Arousal/Alertness: Awake/alert Behavior During Therapy: WFL for tasks assessed/performed Overall Cognitive Status: Within Functional Limits for tasks assessed                                                  Home Living Family/patient expects to be discharged to:: Unsure Living Arrangements: Alone Available Help at Discharge: Family;Friend(s);Available PRN/intermittently Type of Home: House Home Access: Stairs to enter [employer] of Steps: 3 Entrance Stairs-Rails: Right;Left Home Layout: One level     Bathroom Shower/Tub: Teacher, early years/pre: Standard Bathroom Accessibility: Yes   Home Equipment: Conservation officer, nature (2 wheels);Cane - single point;Shower seat;Crutches;Grab bars - toilet;Wheelchair - manual   Additional Comments: children and friend able to assist prn, friend stays at night      Prior Functioning/Environment Prior Level of Function : Independent/Modified Independent             Mobility Comments: walks with RW or cane ADLs Comments: independent with ADLs; family assists with grocery shopping; pt reports she was independent with cooking.         OT Problem List: Decreased activity tolerance;Impaired balance (sitting and/or standing);Pain      OT Treatment/Interventions: Self-care/ADL training;DME and/or AE instruction;Patient/family education;Balance training    OT Goals(Current goals can be found in the care plan section) Acute Rehab OT Goals Patient Stated Goal: hopeful to go home OT Goal Formulation: With patient Time For Goal Achievement: 07/25/22 Potential to Achieve Goals: Good  OT Frequency: Min 2X/week    Co-evaluation PT/OT/SLP Co-Evaluation/Treatment: Yes Reason for Co-Treatment: For patient/therapist safety;To address functional/ADL transfers PT goals addressed during session: Mobility/safety with mobility;Balance;Proper use of DME;Strengthening/ROM OT goals addressed during session: Strengthening/ROM;ADL's and self-care      AM-PAC OT "6 Clicks" Daily Activity     Outcome Measure Help from another person eating meals?: None Help from another person taking care of personal grooming?: A Little Help from another person toileting, which includes using toliet, bedpan, or urinal?: A Little Help from another person bathing (including washing, rinsing, drying)?: A Lot Help from another person to put on and taking off regular upper body clothing?: A Little Help from another person to put on and taking off regular lower body clothing?: A Lot 6 Click Score: 17   End of Session Equipment Utilized During Treatment: Gait belt;Rolling walker (2 wheels)  Activity Tolerance: Patient limited by fatigue Patient left: in bed;with call bell/phone within reach;with bed alarm set;with family/visitor present  OT Visit Diagnosis: Unsteadiness on feet (R26.81);Other abnormalities of gait and mobility (R26.89);Muscle  weakness (generalized) (M62.81);Pain Pain - part of body:  (abdomen)                Time: 8718-3672 OT Time Calculation (min): 23 min Charges:  OT General Charges $OT Visit: 1 Visit OT Evaluation $OT Eval  Moderate Complexity: [place], OTR/L Acute [employer] [facility] [phone] Office [phone]    Almon Register 07/11/2022, 6:21 PM

## 2022-07-11 NOTE — Progress Notes (Signed)
Pharmacy Antibiotic Note  [name] is a 81 y.o. female admitted on 07/08/2022 with GI bleeding, diverticulitis.  Pharmacy was consulted for Zosyn dosing.  Plan: Continue Zosyn 2.25 gm IV q8h Monitor clinical progress & renal function F/U C&S, abx deescalation / LOT   Height: '4\' 11"'$  (149.9 cm) Weight: 63.4 kg (139 lb 12.4 oz) IBW/kg (Calculated) : 43.2  Temp (24hrs), Avg:97.8 F (36.6 C), Min:97.1 F (36.2 C), Max:98.2 F (36.8 C)  Recent Labs  Lab 07/07/22 1206 07/08/22 1932 07/08/22 2215 07/09/22 0446 07/10/22 0612 07/11/22 0410  WBC 7.1 6.5  --  10.1 13.4* 9.5  CREATININE 2.21* 2.77*  --  2.69* 3.41* 3.29*  LATICACIDVEN  --   --  2.7*  --  3.6* 0.8     Estimated Creatinine Clearance: 10.5 mL/min (A) (by C-G formula based on SCr of 3.29 mg/dL (H)).    Allergies  Allergen Reactions   Flagyl [Metronidazole] Rash and Other (See Comments)    ALL-OVER BODY RASH   Augmentin [Amoxicillin-Pot Clavulanate] Diarrhea and Nausea And Vomiting   Ciprofloxacin Diarrhea, Nausea Only and Other (See Comments)    Stomach ache   Coreg [Carvedilol] Other (See Comments)    Terrible cramping in the feet and had a lot of bowel movements, but not diarrhea   Diflucan [Fluconazole] Diarrhea   Losartan Swelling and Other (See Comments)    Patient doesn't recall site of swelling   Privigen [Immune Globulin (Human)] Other (See Comments)    Severe/excruciating pain   Sulfamethoxazole-Trimethoprim Diarrhea, Nausea Only and Other (See Comments)    Stomach upset   Verapamil Hives   Zetia [Ezetimibe] Other (See Comments)    Reaction not recalled   Zocor [Simvastatin - High Dose] Other (See Comments)    Reaction not recalled   Gatifloxacin Rash and Other (See Comments)    Redness to skin around eye    Antimicrobials this admission: 9/30 Zosyn >>         Thank you for allowing pharmacy to be a part of this patient's care.  Royetta Asal, PharmD, BCPS Clinical Pharmacist [facility] Please utilize Amion for appropriate phone number to reach the unit pharmacist ([place]) 07/11/2022 8:10 AM

## 2022-07-11 NOTE — Plan of Care (Signed)

## 2022-07-11 NOTE — Evaluation (Signed)
Physical Therapy Evaluation Patient Details Name: [name] MRN: [id number] DOB: 1941-09-15 Today's Date: 07/11/2022  History of Present Illness  81 y.o. 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 difficile colitis. Recent admission 9/14-9/17/23 for a UTI. Pt [facility] ED on 9/30 via EMS with abdominal pain, diarrhea, dark stools. Dx of upper GIB, ABLA  Clinical Impression  On eval, pt required Min A +2 safety/equipment for mobility. She walked ~15 feet x 2 with a RW. Pt presents with general weakness, decreased activity tolerance, and impaired gait and balance. HR 108 bpm and O2>90% on RA during session. Family was present during session. Briefly discussed possible d/c options: ST SNF vs HHPT with 24/7 supervision. Encouraged pt and family to discuss d/c plan. PT will plan to follow pt during this hospital stay.        Recommendations for follow up therapy are one component of a multi-disciplinary discharge planning process, led by the attending physician.  Recommendations may be updated based on patient status, additional functional criteria and insurance authorization.  Follow Up Recommendations Skilled nursing-short term rehab (<3 hours/day) vs HHPT--depending on family decision) Can patient physically be transported by private vehicle: Yes    Assistance Recommended at Discharge Frequent or constant Supervision/Assistance  Patient can return home with the following  A little help with bathing/dressing/bathroom;Help with stairs or ramp for entrance;Assistance with cooking/housework;Assist for transportation;A little help with walking and/or transfers    Equipment Recommendations None recommended  by PT  Recommendations for Other Services  OT consult    Functional Status Assessment Patient has had a recent decline in their functional status and demonstrates the ability to make significant improvements in function in a reasonable and predictable amount of time.     Precautions / Restrictions Precautions Precautions: Fall Restrictions Weight Bearing Restrictions: No      Mobility  Bed Mobility Overal bed mobility: Needs Assistance Bed Mobility: Supine to Sit, Sit to Supine     Supine to sit: Supervision, HOB elevated Sit to supine: Supervision, HOB elevated   General bed mobility comments: increased time. hob elevated. use of bedrails. cues provided.    Transfers Overall transfer level: Needs assistance Equipment used: Rolling walker (2 wheels) Transfers: Sit to/from Stand Sit to Stand: Min assist, From elevated surface           General transfer comment: 2 attempts to get to standing initially. cues for safety, technique, hand placement. assist to power up, stabilize, control descent.    Ambulation/Gait Ambulation/Gait assistance: Min assist, +2 safety/equipment Gait Distance (Feet): 15 Feet (x2) Assistive device: Rolling walker (2 wheels) Gait Pattern/deviations: Step-through pattern, Decreased stride length, Trunk flexed       General Gait Details: Assist to stabilize pt throughout distance. Cues for safety, RW proximity. Pt fatigues fairly easily. HR 108 bpm, O2 >90% on RA  Stairs            Wheelchair Mobility    Modified Rankin (Stroke Patients Only)       Balance Overall balance assessment: Needs assistance         Standing balance support: Bilateral upper extremity supported, Reliant on assistive device for balance, During functional activity Standing balance-Leahy Scale: Poor  Pertinent Vitals/Pain Pain Assessment Pain Assessment: Faces Faces Pain Scale: Hurts little more Pain  Location: abdomen Pain Descriptors / Indicators: Discomfort, Sore Pain Intervention(s): Limited activity within patient's tolerance, Monitored during session, Repositioned    Home Living Family/patient expects to be discharged to:: Unsure Living Arrangements: Alone Available Help at Discharge: Family;Friend(s);Available PRN/intermittently Type of Home: House Home Access: Stairs to enter Entrance Stairs-Rails: Psychiatric nurse of Steps: 3   Home Layout: One level Home Equipment: Conservation officer, nature (2 wheels);Cane - single point;Shower seat;Crutches;Grab bars - toilet;Wheelchair - manual Additional Comments: children and friend able to assist prn, friend stays at night    Prior Function Prior Level of Function : Independent/Modified Independent             Mobility Comments: walks with RW or cane ADLs Comments: independent with ADLs; family assists with grocery shopping; pt reports she was independent with cooking.     Hand Dominance   Dominant Hand: Right    Extremity/Trunk Assessment   Upper Extremity Assessment Upper Extremity Assessment: Defer to OT evaluation    Lower Extremity Assessment Lower Extremity Assessment: Generalized weakness    Cervical / Trunk Assessment Cervical / Trunk Assessment: Normal  Communication   Communication: No difficulties  Cognition Arousal/Alertness: Awake/alert Behavior During Therapy: WFL for tasks assessed/performed Overall Cognitive Status: Within Functional Limits for tasks assessed                                          General Comments      Exercises     Assessment/Plan    PT Assessment Patient needs continued PT services  PT Problem List Decreased strength;Decreased mobility;Decreased balance;Decreased activity tolerance;Decreased skin integrity;Pain       PT Treatment Interventions DME instruction;Therapeutic activities;Gait training;Therapeutic exercise;Patient/family  education;Functional mobility training    PT Goals (Current goals can be found in the Care Plan section)  Acute Rehab PT Goals Patient Stated Goal: get better and return home PT Goal Formulation: With patient/family Time For Goal Achievement: 07/25/22 Potential to Achieve Goals: Fair    Frequency Min 3X/week     Co-evaluation               AM-PAC PT "6 Clicks" Mobility  Outcome Measure Help needed turning from your back to your side while in a flat bed without using bedrails?: A Little Help needed moving from lying on your back to sitting on the side of a flat bed without using bedrails?: A Little Help needed moving to and from a bed to a chair (including a wheelchair)?: A Little Help needed standing up from a chair using your arms (e.g., wheelchair or bedside chair)?: A Little Help needed to walk in hospital room?: A Lot Help needed climbing 3-5 steps with a railing? : Total 6 Click Score: 15    End of Session Equipment Utilized During Treatment: Gait belt Activity Tolerance: Patient limited by fatigue;Patient tolerated treatment well Patient left: in bed;with call bell/phone within reach;with family/visitor present;with bed alarm set   PT Visit Diagnosis: Muscle weakness (generalized) (M62.81);Difficulty in walking, not elsewhere classified (R26.2)    Time: 1914-7829 PT Time Calculation (min) (ACUTE ONLY): 25 min   Charges:   PT Evaluation $PT Eval Moderate Complexity: [place], PT Acute Rehabilitation  Office: [phone] Pager: [phone]

## 2022-07-11 NOTE — Progress Notes (Addendum)
Daily Progress Note   Patient Name: [name]       Date: 07/11/2022 DOB: December 31, 1940  Age: 81 y.o. MRN#: [id number] Attending Physician: [place] Eric J, DO Primary Care Physician: [name], MD Admit Date: 07/08/2022 Length of Stay: 3 days  Reason for Consultation/Follow-up: Establishing goals of care and Pain control  Subjective:   CC: Patient notes abdominal pain greatly improved today as compared to yesterday..  Subjective:  Reviewed EMR prior to presentation to bedside. Also discussed care with hospitalist.  Presented to bedside in the AM. Patient laying comfortably in the bed today. Patient's son, [name], present at bedside with patient. Able to introduce myself as a member of the palliative care team.   Patient appears much more comfortable today. She describes that her abdominal pain is much more controlled today. She feels some aches at times though it is much better. She does feel like the IV dilaudid is helping to manage her pain well. She has been able to tolerate some clear liquid though awaiting GI input before advancing diet.  [name] noted that as per discussions early today, unable to perform GI procedure again today due to tachycardia. Still weighing risks vs benefits though may see if possible to do tomorrow.   Spent time exploring patient's current pathways for medical care.  Son able to discuss patient between essentially a "rock and a hard place" regarding not being on the blood thinner which would cause blood clots as she has a mechanical valve versus being on the blood thinner which leads to GI bleeds.  He is hopeful the procedure can be performed to find a source of bleeding so that patient can be restarted on Coumadin.  Acknowledged hope for ability to fix source though also expressed concern regarding patient's multiple hospitalizations and repeat episodic bleeding.  With the patient's multiple underlying medical illnesses, spent time exploring  goals for medical care moving forward.  Son denies the patient has completed any advance care planning documentation.  Patient more willing to discuss this today as she is not in pain currently.  Spent time exploring the idea of a [employer] power of attorney/ACP.  Able to review generalities of documents including naming a primary decision maker and other siblings as first and secondary options.  Patient and son to review this document online and will inform providers if want to complete documentation while hospitalized.   Patient willing to discuss today that she knows at some point she is going to need more support and cannot return home safely.  She notes that she does not want to feel like a burden to her children.  Son at bedside noted that he is retired and so patient can come live with him and he can assist in her care.  Encourage further conversations regarding this as patient has had multiple hospitalizations and having family support may allow her more quality time at home.  Patient voiced agreement with this.  Patient and son to continue discussions.  They are waiting for physical therapy input and whether patient is recommended to go to rehab versus can go home with home physical therapy.  Whether patient is appropriate for rehab as per PTs recommendations or not, patient still would likely benefit from going home with her son who can assist in her care and diet.  While patient currently finds coming to the hospital "beneficial" we were able to discuss the possibility that coming to the hospital may become too much of a  burden eventually.  Noted when that occurs, focus should be transition to focusing on comfort at home and time with family.  Able to discuss the idea of hospice.  Son offered that patient help to take care of his father at the end of his life due to prolonged battle with cancer though no hospice was involved at that time.  Open to discussing this further as time  progresses and when patient notes she is "tired".  At this time patient and family willing to have involvement of home palliative to assist with symptom management and continuing important conversations moving forward.  Regarding patient's pain, currently improved.  Discussed addition of liquid Dilaudid solution to allow for medication patient could be discharged home with for pain as needed.  Patient and son open to this suggestion.  Patient also notes that Tylenol has previously helped so noted we would schedule Tylenol.  All questions answered at that time.  Thanks then for allowing me to visit today.  Review of Systems  Constitutional:  Positive for fatigue.  HENT:  Negative for trouble swallowing.   Respiratory:  Negative for apnea and shortness of breath.   Cardiovascular:  Negative for chest pain.  Gastrointestinal:  Positive for abdominal distention and abdominal pain. Negative for nausea.  Neurological:  Positive for weakness.  Psychiatric/Behavioral:  Negative for agitation and confusion.    Objective:   Vital Signs:  BP (!) 162/64 (BP Location: Left Arm)   Pulse (!) 111   Temp 98 F (36.7 C) (Oral)   Resp 18   Ht '4\' 11"'$  (1.499 m)   Wt 63.4 kg   SpO2 99%   BMI 28.23 kg/m   Physical Exam: General: NAD, alert, laying in bed Eyes: conjunctiva clear, anicteric sclera HENT: normocephalic, atraumatic, moist mucous membranes Cardiovascular: tachycardia noted Respiratory: no increase work of breathing noted, not in respiratory distress Abdomen: tender to palpation Extremities: chronic venous statis in LE b/l Skin: no rash noted on visible skin Neuro: A&Ox4 Psych: anxious affect  Assessment & Plan:   Assessment: Patient is a 81 year old female with a past medical history of type 2 diabetes, autoimmune hemolytic anemia, hyperlipidemia, CKD stage IV, ANCA vasculitis on prednisone, HFpEF, A-fib on Coumadin, mechanical aortic valve replacement, and history of  diverticulitis/ESBL UTI/C. difficile colitis who was admitted on 9/30 for GI bleed diverticulitis.  Out of consulted to assist with complex medical decision making.  Recommendations/Plan: # Complex medical decision making/goals of care:  -Awaiting further recommendations from GI regarding if scope procedure appropriate. Patient noted improvement of pain at this time.   -Encouraged completion of HCPOA/ACP documentation.  Patient and son will review and inform staff if wanting to complete while hospitalized.  -Currently finding returns to the hospital "beneficial".  We did discuss that should patient ever deem hospitalizations and outpatient follow-up with other providers is becoming too burdensome and she would like to focus on quality time at home with family, could consider transition to hospice at that time.  As patient wants to new with current medical interventions, place consult for [facility] to assist with referral for home palliative care.  Patient and son appreciate continued palliative care involvement.  -  Code Status: DNR  # Symptom management   -Pain, acute abdominal secondary to diverticulitis   Pain currently improved.   - Started Dilaudid 1-2 mg p.o. solution every 6 hours as needed for moderate or severe pain.  Oral medication should be given prior to IV medication.  Should the medication not be  lasting between the 6-hour intervals, could adjust frequency to every 4 hours as needed.   - Continue Dilaudid 1 mg every 3 hours as needed for severe pain.   -Started Tylenol 1000 mg 3 times daily scheduled.  # Discharge Planning: Awaiting PT recommendations.  Patient admits to needing more support than can be provided living alone.  Son willing to discuss patient coming to live with him as he has retired and he has room set up for patient.  Hopefully patient agreeing with discharge to patient's sons home if appropriate for home health or can return to his home after rehab.  -At this time, plan  for patient to be referred to home palliative care for continued follow-up.  Discussed with: Patient, patient's son, hospitalist  Thank you for allowing [place] to participate in the care of [name]  This provider spent a total of 54 minutes providing patient's care.  Includes review of EMR, discussing care with other staff members involved in patient's medical care, obtaining relevant history and information from patient and/or patient's family, coordination of care, and personal review of imaging and lab work. Greater than 50% of the time was spent counseling and coordinating care related to the above assessment and plan.    Chelsea Aus, DO Palliative Care Provider Team Phone # [phone] (Nights/Weekends)

## 2022-07-12 DIAGNOSIS — Z515 Encounter for palliative care: Secondary | ICD-10-CM

## 2022-07-12 DIAGNOSIS — K922 Gastrointestinal hemorrhage, unspecified: Secondary | ICD-10-CM | POA: Diagnosis not present

## 2022-07-12 DIAGNOSIS — R600 Localized edema: Secondary | ICD-10-CM | POA: Diagnosis not present

## 2022-07-12 DIAGNOSIS — D649 Anemia, unspecified: Secondary | ICD-10-CM | POA: Diagnosis not present

## 2022-07-12 DIAGNOSIS — D591 Autoimmune hemolytic anemia, unspecified: Secondary | ICD-10-CM | POA: Diagnosis not present

## 2022-07-12 LAB — GLUCOSE, CAPILLARY
Glucose-Capillary: 106 mg/dL — ABNORMAL HIGH (ref 70–99)
Glucose-Capillary: 117 mg/dL — ABNORMAL HIGH (ref 70–99)
Glucose-Capillary: 127 mg/dL — ABNORMAL HIGH (ref 70–99)
Glucose-Capillary: 82 mg/dL (ref 70–99)
Glucose-Capillary: 87 mg/dL (ref 70–99)
Glucose-Capillary: 88 mg/dL (ref 70–99)

## 2022-07-12 LAB — CBC
HCT: 25.5 % — ABNORMAL LOW (ref 36.0–46.0)
Hemoglobin: 8.4 g/dL — ABNORMAL LOW (ref 12.0–15.0)
MCH: 31.5 pg (ref 26.0–34.0)
MCHC: 32.9 g/dL (ref 30.0–36.0)
MCV: 95.5 fL (ref 80.0–100.0)
Platelets: 109 10*3/uL — ABNORMAL LOW (ref 150–400)
RBC: 2.67 MIL/uL — ABNORMAL LOW (ref 3.87–5.11)
RDW: 17.7 % — ABNORMAL HIGH (ref 11.5–15.5)
WBC: 7.4 10*3/uL (ref 4.0–10.5)
nRBC: 1.2 % — ABNORMAL HIGH (ref 0.0–0.2)

## 2022-07-12 LAB — BASIC METABOLIC PANEL
Anion gap: 12 (ref 5–15)
BUN: 121 mg/dL — ABNORMAL HIGH (ref 8–23)
CO2: 19 mmol/L — ABNORMAL LOW (ref 22–32)
Calcium: 8.3 mg/dL — ABNORMAL LOW (ref 8.9–10.3)
Chloride: 109 mmol/L (ref 98–111)
Creatinine, Ser: 2.95 mg/dL — ABNORMAL HIGH (ref 0.44–1.00)
GFR, Estimated: 15 mL/min — ABNORMAL LOW (ref >60)
Glucose, Bld: 80 mg/dL (ref 70–99)
Potassium: 4.2 mmol/L (ref 3.5–5.1)
Sodium: 140 mmol/L (ref 135–145)

## 2022-07-12 LAB — PROTIME-INR
INR: 1.5 — ABNORMAL HIGH (ref 0.8–1.2)
Prothrombin Time: 18 seconds — ABNORMAL HIGH (ref 11.4–15.2)

## 2022-07-12 MED ORDER — PIPERACILLIN-TAZOBACTAM 3.375 G IVPB
3.3750 g | Freq: Two times a day (BID) | INTRAVENOUS | Status: DC
  Administered 2022-07-12 – 2022-07-14 (×4): 3.375 g via INTRAVENOUS
  Filled 2022-07-12 (×4): qty 50

## 2022-07-12 MED ORDER — ORAL CARE MOUTH RINSE: 15.0000 mL | OROMUCOSAL | Status: DC | PRN

## 2022-07-12 MED ORDER — PANTOPRAZOLE INFUSION (NEW) - SIMPLE MED
8.0000 mg/h | INTRAVENOUS | Status: DC
  Administered 2022-07-12 – 2022-07-13 (×2): 8 mg/h via INTRAVENOUS
  Filled 2022-07-12 (×2): qty 100
  Filled 2022-07-12: qty 80
  Filled 2022-07-12: qty 100

## 2022-07-12 MED ORDER — PANTOPRAZOLE SODIUM 40 MG IV SOLR: 40.0000 mg | Freq: Two times a day (BID) | INTRAVENOUS | Status: DC

## 2022-07-12 NOTE — TOC Initial Note (Signed)
Transition of Care [facility]) - Initial/Assessment Note    Patient Details  Name: [name] MRN: [id number] Date of Birth: 01-10-1941  Transition of Care [facility]) CM/SW Contact:    Roseanne Kaufman, RN Phone Number: 07/12/2022, 4:01 PM  Clinical Narrative:    PT recommend short term SNF, called patient at bedside spoke with daughter Helene Kelp explained SNF placement process. Helene Kelp verbalized understanding and agreed with pursuing SNF bed. FL2 faxed out, awaiting bed offers and insurance auth.  TOC will continue to follow.               Expected Discharge Plan: Skilled Nursing Facility Barriers to Discharge: Continued Medical Work up   Patient Goals and CMS Choice Patient states their goals for this hospitalization and ongoing recovery are:: receive short term rehab CMS Medicare.gov Compare Post Acute Care list provided to:: Patient Represenative (must comment) (Daughter: Stefanie Libel)    Expected Discharge Plan and Services Expected Discharge Plan: [place] In-house Referral: NA Discharge Planning Services: CM Consult Post Acute Care Choice: [place] Living arrangements for the past 2 months: Single Family Home                 DME Arranged: N/A DME Agency: NA       HH Arranged: NA HH Agency: NA        Prior Living Arrangements/Services Living arrangements for the past 2 months: Single Family Home Lives with:: Self Patient language and need for interpreter reviewed:: Yes Do you feel safe going back to the place where you live?: Yes      Need for Family Participation in Patient Care: Yes (Comment) Care giver support system in place?: Yes (comment)   Criminal Activity/Legal Involvement Pertinent to Current Situation/Hospitalization: No - Comment as needed  Activities of Daily Living Home Assistive Devices/Equipment: Bedside commode/3-in-1, Raised toilet seat with rails, Walker (specify type) ADL Screening (condition at time of  admission) Patient's cognitive ability adequate to safely complete daily activities?: Yes Is the patient deaf or have difficulty hearing?: No Does the patient have difficulty seeing, even when wearing glasses/contacts?: No Does the patient have difficulty concentrating, remembering, or making decisions?: No Patient able to express need for assistance with ADLs?: Yes Does the patient have difficulty dressing or bathing?: No Independently performs ADLs?: Yes (appropriate for developmental age) Does the patient have difficulty walking or climbing stairs?: Yes Weakness of Legs: Both Weakness of Arms/Hands: None  Permission Sought/Granted Permission sought to share information with : Case Manager Permission granted to share information with : Yes, Verbal Permission Granted  Share Information with NAME: Case Manager           Emotional Assessment Appearance:: Appears stated age Attitude/Demeanor/Rapport: Gracious Affect (typically observed): Accepting Orientation: : Oriented to Self, Oriented to Place, Oriented to  Time Alcohol / Substance Use: Not Applicable Psych Involvement: No (comment)  Admission diagnosis:  Coagulopathy ([place]) [D68.9] GI bleeding [K92.2] GI bleed [K92.2] Gastrointestinal hemorrhage, unspecified gastrointestinal hemorrhage type [K92.2] Anemia, unspecified type [D64.9] Patient Active Problem List   Diagnosis Date Noted   Palliative care encounter    Goals of care, counseling/discussion    Counseling and coordination of care    GI bleed 07/09/2022   GI bleeding 07/08/2022   Acute on chronic anemia 07/08/2022   History of Clostridioides difficile colitis 07/08/2022   DNR (do not resuscitate)/DNI(Do Not Intubate) 07/08/2022   Anemia of chronic disease 07/07/2022   Pressure injury of skin 06/23/2022   UTI (urinary tract  infection) 06/22/2022   Joint pain 06/09/2022   Parietoalveolar pneumopathy ([place]) 06/09/2022   Primary osteoarthritis 06/09/2022    Rheumatoid factor positive 06/09/2022   C. difficile colitis 04/19/2022   Colitis 04/18/2022   PAH (pulmonary artery hypertension) ([place]) 04/18/2022   Emphysematous cystitis 04/13/2022   Female bladder prolapse 04/13/2022   Diverticulitis 04/12/2022   Autoimmune hemolytic anemia ([place]) 01/02/2022   Bilateral lower extremity edema 01/02/2022   Refractory anemia (HCC) 12/15/2021   Abnormal chest x-ray 12/15/2021   Supratherapeutic INR 12/15/2021   Elevated troponin 12/15/2021   Unilateral primary osteoarthritis, right hip 05/31/2021   Midline cystocele 05/30/2021   Primary localized osteoarthritis of pelvic region and thigh 05/30/2021   Vaginal pain 05/30/2021   Asymmetric SNHL (sensorineural hearing loss) 01/06/2021   Referred otalgia of both ears 01/06/2021   Bilateral hearing loss 08/31/2020   Low back pain 02/20/2020   CKD (chronic kidney disease), stage IV (HCC) 10/13/2019   Leukopenia due to antineoplastic chemotherapy ([place]) 08/18/2019   Thrombophilia ([place]) 06/19/2019   Encounter for general adult medical examination without abnormal findings 06/13/2019   Arteritis ([place]) 06/06/2019   Hypertensive heart and chronic kidney disease with heart failure and stage 1 through stage 4 chronic kidney disease, or unspecified chronic kidney disease ([place]) 06/06/2019   Muscle weakness 06/06/2019   Pancytopenia ([place]) 05/28/2019   Dyspnea 05/28/2019   Vasculitis, ANCA positive 05/28/2019   Disorder of connective tissue ([place]) 05/14/2019   Renal insufficiency 05/08/2019   PAF (paroxysmal atrial fibrillation) ([place]) 04/07/2019   Iron deficiency anemia 02/28/2019   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 ([place]) 02/28/2019   Diabetic retinopathy associated with type 2 diabetes mellitus ([place]) 09/02/2018   Chronic pain 06/19/2018   Non-thrombocytopenic purpura ([place]) 05/21/2018   Gout 03/22/2018   Malnutrition of moderate degree 03/12/2018   Diabetes mellitus type 2, controlled  ([place]) 03/06/2018   H/O mechanical aortic valve replacement 03/06/2018   Moderate to severe pulmonary hypertension ([place]) 03/06/2018   GERD (gastroesophageal reflux disease) 02/21/2018   Anxiety 02/21/2018   Chronic diastolic CHF (congestive heart failure) ([place]) 02/21/2018   General weakness 02/02/2018   Thrombocytopenia ([place]) 01/25/2018   Arm pain, diffuse 01/25/2018   Anemia 01/23/2018   Aortic atherosclerosis ([place]) 01/23/2018   Age-related osteoporosis without current pathological fracture 05/24/2017   Carotid artery occlusion 05/24/2017   Generalized anxiety disorder 05/24/2017   Hearing loss 05/24/2017   Presence of prosthetic heart valve 05/24/2017   Carotid artery disease ([place]) 08/18/2013   Peripheral arterial disease ([place]) 08/18/2013   Diabetes mellitus without complication ([place]) 50/27/7412   Hypertension    Hypercholesteremia    Mechanical heart valve present    Chronic anticoagulation    PCP:  [name], MD Pharmacy:   [facility] [id number] - [address], [place] [address] Phone: [phone] Fax: [phone]  [facility] - [place], [place] [address] Phone: [phone] Fax: [phone]     Social Determinants of Health (SDOH) Interventions    Readmission Risk Interventions    04/20/2022   12:02 PM  Readmission Risk Prevention Plan  Transportation Screening Complete  Medication Review (RN Care Manager) Complete  PCP or Specialist appointment within 3-5 days of discharge Complete  HRI or [place] Complete  SW Recovery Care/Counseling Consult Complete  Palliative Care Screening Not [place] Not Applicable

## 2022-07-12 NOTE — Progress Notes (Signed)
PHARMACY NOTE -  [facility] has been assisting with dosing of Zosyn for diverticulitis Will adjust dose to 3.375 g IV q 12 hr based on CrCl < 20 ml/min; further renal adjustments per institutional Pharmacy antibiotic protocol Also remains on PO vancomycin for Cdiff prophylaxis d/t history of same  Pharmacy will sign off, following peripherally for culture results or dose adjustments. Please reconsult if a change in clinical status warrants re-evaluation of dosage.  Reuel Boom, PharmD, BCPS [phone] 07/12/2022, 11:17 AM

## 2022-07-12 NOTE — Progress Notes (Addendum)
[facility] Gastroenterology Progress Note  [name] 81 y.o. 1940/12/25  Subjective: Patient seen and examined laying in bed, reports one episode of melena today. No emesis, abdominal pain slightly improved.   Objective: Vital signs in last 24 hours: Vitals:   07/11/22 2001 07/12/22 0421  BP: (!) 149/48 139/60  Pulse: 100 93  Resp: 17 19  Temp: 98.2 F (36.8 C) 98.3 F (36.8 C)  SpO2: 99% 100%    Physical Exam:  General:  Alert, cooperative, no distress, appears stated age  Head:  Normocephalic, without obvious abnormality, atraumatic  Eyes:  Anicteric sclera, EOM's intact  Lungs:   Clear to auscultation bilaterally, respirations unlabored  Heart:  Irregularly irregular rhythm, S1, S2 normal  Abdomen:   Soft, generalized tenderness worse to the epigastric region, bowel sounds active all four quadrants,  no masses,   Extremities: Extremities normal, atraumatic, no  edema  Pulses: 2+ and symmetric    Lab Results: Recent Labs    07/11/22 0410 07/12/22 0433  NA 138 140  K 4.4 4.2  CL 110 109  CO2 18* 19*  GLUCOSE 229* 80  BUN 141* 121*  CREATININE 3.29* 2.95*  CALCIUM 8.1* 8.3*  MG 2.3  --    No results for input(s): "AST", "ALT", "ALKPHOS", "BILITOT", "PROT", "ALBUMIN" in the last 72 hours. Recent Labs    07/11/22 0410 07/11/22 1339 07/12/22 0433  WBC 9.5  --  7.4  HGB 9.1* 8.6* 8.4*  HCT 27.1* 25.7* 25.5*  MCV 92.8  --  95.5  PLT 104*  --  109*   Recent Labs    07/11/22 0410 07/12/22 0433  LABPROT 18.2* 18.0*  INR 1.5* 1.5*      Assessment Melena Anemia Chronic anticoagulation A-fib Abdominal pain   Abdominal pain much improved on Dilaudid.  Patient overall feeling improved.   She did have 1 episode of melena today. Hgb 9.1(8.6) (patient received 2 units packed red blood cells 10/2).  EGD 10/2 was canceled due to elevated heart rate and abdominal pain.   CT AP 07/08/2022 Marked duodenitis/duodenal diverticulitis, no evidence of  perforation   Would consider EGD for further evaluation of melena after inflammation has improved.     Plan: Plan for EGD tomorrow. I thoroughly discussed the procedures to include nature, alternatives, benefits, and risks including but not limited to bleeding, perforation, infection, anesthesia/cardiac and pulmonary complications. Patient provides understanding and gave verbal consent to proceed. Start Protonix IV '8mg'$ /h drip. Continue IV antibiotics. Continue clear liquid diet. NPO at midnight. Continue anti-emetics and supportive care as needed. [facility] will follow.     Arvella Nigh Meriam Chojnowski PA-C 07/12/2022, 12:21 PM  Contact #  [phone]

## 2022-07-12 NOTE — Plan of Care (Signed)
  Problem: Health Behavior/Discharge Planning: Goal: Ability to manage health-related needs will improve Outcome: Progressing   Problem: Nutritional: Goal: Maintenance of adequate nutrition will improve Outcome: Progressing   Problem: Skin Integrity: Goal: Risk for impaired skin integrity will decrease Outcome: Progressing   Problem: Health Behavior/Discharge Planning: Goal: Ability to manage health-related needs will improve Outcome: Progressing   

## 2022-07-12 NOTE — NC FL2 (Signed)
[place] LEVEL OF CARE SCREENING TOOL     IDENTIFICATION  Patient Name: [name] Birthdate: Sep 18, 1941 Sex: female Admission Date (Current Location): 07/08/2022  [facility] and [place] Number:  Herbalist and Address:  [facility],  [place], [place]      Provider Number: [id number]  Attending Physician Name and Address:  Kerney Elbe, DO  Relative Name and Phone Number:  Teondra Newburg (son), Stefanie Libel (daughter)    Current Level of Care: Hospital Recommended Level of Care: [place] Prior Approval Number:    Date Approved/Denied:   PASRR Number: [id number]  Discharge Plan: SNF    Current Diagnoses: Patient Active Problem List   Diagnosis Date Noted   Palliative care encounter    Goals of care, counseling/discussion    Counseling and coordination of care    GI bleed 07/09/2022   GI bleeding 07/08/2022   Acute on chronic anemia 07/08/2022   History of Clostridioides difficile colitis 07/08/2022   DNR (do not resuscitate)/DNI(Do Not Intubate) 07/08/2022   Anemia of chronic disease 07/07/2022   Pressure injury of skin 06/23/2022   UTI (urinary tract infection) 06/22/2022   Joint pain 06/09/2022   Parietoalveolar pneumopathy ([place]) 06/09/2022   Primary osteoarthritis 06/09/2022   Rheumatoid factor positive 06/09/2022   C. difficile colitis 04/19/2022   Colitis 04/18/2022   PAH (pulmonary artery hypertension) ([place]) 04/18/2022   Emphysematous cystitis 04/13/2022   Female bladder prolapse 04/13/2022   Diverticulitis 04/12/2022   Autoimmune hemolytic anemia ([place]) 01/02/2022   Bilateral lower extremity edema 01/02/2022   Refractory anemia ([place]) 12/15/2021   Abnormal chest x-ray 12/15/2021   Supratherapeutic INR 12/15/2021   Elevated troponin 12/15/2021   Unilateral primary osteoarthritis, right hip 05/31/2021   Midline cystocele 05/30/2021   Primary localized osteoarthritis of pelvic  region and thigh 05/30/2021   Vaginal pain 05/30/2021   Asymmetric SNHL (sensorineural hearing loss) 01/06/2021   Referred otalgia of both ears 01/06/2021   Bilateral hearing loss 08/31/2020   Low back pain 02/20/2020   CKD (chronic kidney disease), stage IV ([place]) 10/13/2019   Leukopenia due to antineoplastic chemotherapy ([place]) 08/18/2019   Thrombophilia ([place]) 06/19/2019   Encounter for general adult medical examination without abnormal findings 06/13/2019   Arteritis ([place]) 06/06/2019   Hypertensive heart and chronic kidney disease with heart failure and stage 1 through stage 4 chronic kidney disease, or unspecified chronic kidney disease ([place]) 06/06/2019   Muscle weakness 06/06/2019   Pancytopenia ([place]) 05/28/2019   Dyspnea 05/28/2019   Vasculitis, ANCA positive 05/28/2019   Disorder of connective tissue ([place]) 05/14/2019   Renal insufficiency 05/08/2019   PAF (paroxysmal atrial fibrillation) ([place]) 04/07/2019   Iron deficiency anemia 02/28/2019   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 ([place]) 02/28/2019   Diabetic retinopathy associated with type 2 diabetes mellitus ([employer]) 09/02/2018   Chronic pain 06/19/2018   Non-thrombocytopenic purpura ([place]) 05/21/2018   Gout 03/22/2018   Malnutrition of moderate degree 03/12/2018   Diabetes mellitus type 2, controlled ([place]) 03/06/2018   H/O mechanical aortic valve replacement 03/06/2018   Moderate to severe pulmonary hypertension ([place]) 03/06/2018   GERD (gastroesophageal reflux disease) 02/21/2018   Anxiety 02/21/2018   Chronic diastolic CHF (congestive heart failure) ([place]) 02/21/2018   General weakness 02/02/2018   Thrombocytopenia ([place]) 01/25/2018   Arm pain, diffuse 01/25/2018   Anemia 01/23/2018   Aortic atherosclerosis ([place]) 01/23/2018   Age-related osteoporosis without current pathological fracture 05/24/2017   Carotid  artery occlusion 05/24/2017   Generalized anxiety disorder 05/24/2017   Hearing loss 05/24/2017    Presence of prosthetic heart valve 05/24/2017   Carotid artery disease ([place]) 08/18/2013   Peripheral arterial disease ([place]) 08/18/2013   Diabetes mellitus without complication ([place]) 19/14/7829   Hypertension    Hypercholesteremia    Mechanical heart valve present    Chronic anticoagulation     Orientation RESPIRATION BLADDER Height & Weight     Self, Time, Situation, Place  Normal Continent Weight: 67 kg Height:  '4\' 11"'$  (149.9 cm)  BEHAVIORAL SYMPTOMS/MOOD NEUROLOGICAL BOWEL NUTRITION STATUS      Continent Diet (clear liquid)  AMBULATORY STATUS COMMUNICATION OF NEEDS Skin   Limited Assist Verbally Normal                       Personal Care Assistance Level of Assistance  Bathing, Feeding, Dressing Bathing Assistance: Limited assistance Feeding assistance: Independent Dressing Assistance: Limited assistance     Functional Limitations Info  Sight, Hearing, Speech Sight Info: Adequate (wears eye glasses) Hearing Info: Adequate Speech Info: Adequate    SPECIAL CARE FACTORS FREQUENCY                       Contractures Contractures Info: Not present    Additional Factors Info  Code Status, Allergies Code Status Info: DNR Allergies Info: Flagyl (Metronidazole), Augmentin (Amoxicillin-pot Clavulanate), Ciprofloxacin, Coreg (Carvedilol), Diflucan (Fluconazole), Losartan, Privigen (Immune Globulin (Human)), Sulfamethoxazole-trimethoprim, Verapamil, Zetia (Ezetimibe), Zocor (Simvastatin - High Dose), Gatifloxacin           Current Medications (07/12/2022):  This is the current hospital active medication list Current Facility-Administered Medications  Medication Dose Route Frequency Provider Last Rate Last Admin   0.9 %  sodium chloride infusion (Manually program via Guardrails IV Fluids)   Intravenous Once [place] Donnamarie Poag, DO   Held at 07/09/22 1014   0.9 %  sodium chloride infusion   Intravenous Continuous [name], MD       0.9 %  sodium chloride infusion    Intravenous Continuous [place] Eric J, DO 75 mL/hr at 07/12/22 1252 New Bag at 07/12/22 1252   0.9 %  sodium chloride infusion   Intravenous Continuous Scheck, Arvella Nigh, PA-C       acetaminophen (TYLENOL) tablet 650 mg  650 mg Oral Q6H PRN Kristopher Oppenheim, DO   650 mg at 07/09/22 1833   Or   acetaminophen (TYLENOL) suppository 650 mg  650 mg Rectal Q6H PRN Kristopher Oppenheim, DO       acetaminophen (TYLENOL) tablet 1,000 mg  1,000 mg Oral TID Terrilee Files, DO   1,000 mg at 07/12/22 0854   ALPRAZolam (XANAX) tablet 0.5 mg  0.5 mg Oral QHS [place] Donnamarie Poag, DO   0.5 mg at 07/11/22 2137   amLODipine (NORVASC) tablet 10 mg  10 mg Oral Daily [place] Eric J, DO   10 mg at 07/12/22 5621   hydrALAZINE (APRESOLINE) tablet 100 mg  100 mg Oral TID [place] Eric J, DO   100 mg at 07/12/22 0854   HYDROmorphone (DILAUDID) injection 1 mg  1 mg Intravenous Q3H PRN [place] Eric J, DO   1 mg at 07/11/22 1841   HYDROmorphone HCl (DILAUDID) liquid 1-2 mg  1-2 mg Oral Q6H PRN Terrilee Files, DO   1 mg at 07/11/22 1736   insulin aspart (novoLOG) injection 0-20 Units  0-20 Units Subcutaneous TID WC [place] Donnamarie Poag, DO  3 Units at 07/11/22 1709   insulin aspart (novoLOG) injection 3 Units  3 Units Subcutaneous TID WC [place] Eric J, DO   3 Units at 07/11/22 0845   insulin glargine-yfgn [facility]) injection 20 Units  20 Units Subcutaneous Daily [place] Eric J, DO   20 Units at 07/12/22 0856   metoprolol succinate (TOPROL-XL) 24 hr tablet 12.5 mg  12.5 mg Oral BID [place] Eric J, DO   12.5 mg at 07/12/22 0854   ondansetron (ZOFRAN) tablet 4 mg  4 mg Oral Q6H PRN Kristopher Oppenheim, DO       Or   ondansetron [facility]) injection 4 mg  4 mg Intravenous Q6H PRN Kristopher Oppenheim, DO   4 mg at 07/12/22 1527   Oral care mouth rinse  15 mL Mouth Rinse PRN [place] Donnamarie Poag, DO       [START ON 07/16/2022] pantoprazole (PROTONIX) injection 40 mg  40 mg Intravenous Q12H Scheck, Gabrielle N, PA-C       pantoprozole (PROTONIX) 80 mg /NS 100 mL infusion   8 mg/hr Intravenous Continuous Charlott Rakes, PA-C 10 mL/hr at 07/12/22 1258 8 mg/hr at 07/12/22 1258   piperacillin-tazobactam (ZOSYN) IVPB 2.25 g  2.25 g Intravenous Q8H Polly Cobia, RPH 100 mL/hr at 07/12/22 1537 2.25 g at 07/12/22 1537   piperacillin-tazobactam (ZOSYN) IVPB 3.375 g  3.375 g Intravenous Q12H Polly Cobia, RPH       [START ON 07/17/2022] predniSONE (DELTASONE) tablet 10 mg  10 mg Oral Q breakfast Iruku, Praveena, MD       predniSONE (DELTASONE) tablet 15 mg  15 mg Oral Q breakfast Iruku, Praveena, MD   15 mg at 07/12/22 0853   [START ON 07/22/2022] predniSONE (DELTASONE) tablet 5 mg  5 mg Oral Q breakfast Iruku, Praveena, MD       prochlorperazine (COMPAZINE) injection 10 mg  10 mg Intravenous Q6H PRN [place] Donnamarie Poag, DO   10 mg at 07/12/22 1124   tadalafil (CIALIS) tablet 40 mg  40 mg Oral QHS [place] Donnamarie Poag, DO   40 mg at 07/11/22 2138   vancomycin (VANCOCIN) capsule 125 mg  125 mg Oral BID [place] Eric J, DO   125 mg at 07/12/22 3557     Discharge Medications: Please see discharge summary for a list of discharge medications.  Relevant Imaging Results:  Relevant Lab Results:   Additional Information SSN: [id number]  Roseanne Kaufman, RN

## 2022-07-12 NOTE — Progress Notes (Signed)
PROGRESS NOTE    DEL WISEMAN  HEN:[id number] DOB: 1941-02-07 DOA: 07/08/2022 PCP: [name], MD   Brief Narrative:  [name] is a 81 y.o. 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 difficile colitis who presented to [facility] ED on 9/30 via EMS with abdominal pain, diarrhea, dark stools.  Patient was seen by her oncologist day prior to admission who follows her for her hemolytic anemia.  She has been unable to wean off of prednisone due to worsening anemia.  Following return from her doctor's appointment, patient reports a sudden urge to defecate with notable melanotic stools around 6 PM.  She reports consistent melanotic stools all night associated with weakness/fatigue.  Also with some generalized abdominal pain but denies fever.  Given persistence of symptoms, patient called EMS and was brought to the ED for further evaluation.   In the ED, temperature 97.6, HR 89, RR 16, BP 133/50, SPO2 100% on room air.  WBC 6.5, hemoglobin 5.6, platelets 148.  Sodium 132, potassium 5.2, chloride 102, CO2 19, glucose 354, BUN 138, creatinine 2.77.  Lipase 51.  AST 23, ALT 16, total bilirubin 0.7.  Lactic acid 2.7.  INR 5.4.  FOBT positive.  C. difficile negative.  CT abdomen/pelvis with marked duodenitis/duodenal diverticulitis no evidence of perforation with questionable mild diverticulitis ascending colon and cecum.  EDP ordered vitamin K for INR reversal and 2 unit PRBCs.  TRH consulted for admission and further evaluation and management of acute GI bleed in setting of supratherapeutic INR.   Assessment and Plan:  Acute blood loss/symptomatic anemia Acute upper GI bleed Hx iron deficiency/anemia of chronic  medical/renal disease -Patient presenting to ED with abdominal discomfort, fatigue associated with recurrent melanotic stools.   -Hemoglobin 5.6 on admission (recently 8.7 on 9/29 and 9.6 on 9/21).   -Additionally BUN elevated at 139 (was 84 on 9/17) and FOBT positive.   -Additionally INR elevated at 5.4 which is likely contributing factor.   -Etiology likely secondary to upper GI bleed.  Received IV vitamin K per EDP. [facility] following and plan is for EGD in the AM  -Transfused 5 units total as she was given 2 units pRBC on 9/30, 1 unit 10/1, 2u 10/2 --Hgb 5.4>7.1>8.7>8.7>7.1>9.4>9.1 and now Hgb/Hct hast gone to 8.6/25.7 -> 8.4/25.5 -- INR 5.4>2.1>1.5>1.5 x2 --Continuting  Protonix drip -- Holding Coumadin still -- Given cardiac history, goal Hgb >/= 8.0 --Continue to Monitor on telemetry, supportive care; GI is planning for an EGD tomorrow given that she continues to have black stools   Acute Diverticulitis Lactic acidosis CT abdomen/pelvis with marked duodenitis/duodenal diverticulitis, no evidence of perforation with questionable diverticulitis ascending colon/cecum.  Patient was complaining of some abdominal discomfort. --C/w IV Zosyn for duodenitis/duodenal diverticulitis and possible right-sided colonic diverticulitis --Oral vancomycin twice daily while on antibiotics for C. difficile colitis prophylaxis --Clear liquid diet --CBC daily   Paroxysmal atrial fibrillation Hx mechanical aortic valve replacement on Coumadin Supratherapeutic INR -INR notably elevated at 5.4 on admission, goal 2.0-3.0.  In the setting of active bleeding as above. --Continue to hold Coumadin/aspirin --INR daily   Autoimmune hemolytic anemia ANCA vasculitis -Follows with hematology, Dr. Chryl Heck outpatient.   -Maintained on prednisone 20 mg p.o. daily.  Continue outpatient follow-up. --Continue prednisone taper per oncology   Acute  renal failure on CKD stage IV Metabolic acidosis -Baseline  creatinine 2.2-2.4. -Creatinine on admission 2.77; etiology likely secondary to dehydration versus acute blood loss anemia with ATN given GI bleed as above.   -Refused sodium bicarb in the past due to "side effects". --Cr 2.77>2.69>3.41>3.29 and albumin/creatinine is 121/2.95 --s/p 5u pRBC as above --NS at 75 mL/hr now stopped given that her lower extremities are swollen --Avoid nephrotoxins, renal dose all medications --BMP daily   Type 2 diabetes mellitus Home regimen includes Tresiba 9-14 units subcutaneously daily, NovoLog insulin sliding scale.  Hemoglobin A1c 6.3 on 04/27/2022, well controlled. --SSI for coverage --Cbgs QAC/HS   Chronic diastolic congestive heart failure -Home regimen includes metoprolol succinate 12.5 mg p.o. daily, furosemide 40 mg p.o. twice daily, spironolactone 25 mg p.o. daily. -- Continue metoprolol succinate 12.5 mg p.o. BID -- Hold home spironolactone and furosemide for now -We will stop IV fluid hydration given that her legs are getting more swollen and because she is +5.655 L -- Strict I's and O's and daily weights -Continue to Monitor and trend and repeat CMP daily   Essential hypertension -- Continue home amlodipine 10 mg p.o. daily, hydralazine 100 mg p.o. 3 times daily, metoprolol succinate 12.5 mg p.o. daily, -- Hold home clonidine, furosemide, spironolactone for now -- Hold home aspirin in setting of GI bleed -Continue monitor blood pressures per protocol; last blood pressure reading was a little elevated at 150/53   Anxiety: -Continue alprazolam as needed   Pulmonary artery hypertension -Continue tadalafil 40 mg p.o. nightly, prednisone 20 mg p.o. daily -Stopping IV fluid hydration   Hx C. difficile colitis C. difficile PCR on admission negative. --Continue vancomycin 125 mg p.o. twice daily while on antibiotics for acute diverticulitis as above for prophylaxis   Weakness/debility/deconditioning: -PT and OT recommending SNF versus home  health PT depending on family decision   Social: -Patient currently lives alone, appears to have very poor social support with recurrent hospitalizations.  Patient is a DNR.  Overall prognosis is grim/poor -Palliative care consult and currently awaiting further recommendations from GI and palliative is encouraged completion of the healthcare power of attorney and ACP documentation; her CODE STATUS remains DNR   DVT prophylaxis: SCDs Start: 07/09/22 0058    Code Status: DNR Family Communication: Discussed with family at bedside  Disposition Plan:  Level of care: Progressive Status is: Inpatient Remains inpatient appropriate because: Patient is to undergo an EGD in the morning   Consultants:  Gastroenterology Medical oncology and hematology Palliative care  Procedures:  As above  Antimicrobials:  Anti-infectives (From admission, onward)    Start     Dose/Rate Route Frequency Ordered Stop   07/12/22 2200  piperacillin-tazobactam (ZOSYN) IVPB 3.375 g        3.375 g 12.5 mL/hr over 240 Minutes Intravenous Every 12 hours 07/12/22 1114     07/09/22 1000  vancomycin (VANCOCIN) capsule 125 mg        125 mg Oral 2 times daily 07/09/22 0814     07/09/22 0600  piperacillin-tazobactam (ZOSYN) IVPB 2.25 g        2.25 g 100 mL/hr over 30 Minutes Intravenous Every 8 hours 07/08/22 2356 07/12/22 1607   07/09/22 0000  vancomycin (VANCOCIN) capsule 125 mg  Status:  Discontinued        125 mg Oral 4 times daily 07/08/22 2348 07/09/22 0814   07/08/22 2045  piperacillin-tazobactam (ZOSYN) IVPB 3.375 g        3.375 g 100 mL/hr over  30 Minutes Intravenous  Once 07/08/22 2040 07/08/22 2348       Subjective: Seen and examined at bedside and was doing okay but did report an episode of black stools.  Continues to have some abdominal discomfort but wanting her diet to be advanced.  No chest pain or any other concerns or complaints at this time  Objective: Vitals:   07/11/22 2001 07/12/22 0421  07/12/22 0500 07/12/22 1315  BP: (!) 149/48 139/60  (!) 150/53  Pulse: 100 93  97  Resp: '17 19  18  '$ Temp: 98.2 F (36.8 C) 98.3 F (36.8 C)  98.8 F (37.1 C)  TempSrc: Oral Oral  Oral  SpO2: 99% 100%  96%  Weight:   67 kg   Height:        Intake/Output Summary (Last 24 hours) at 07/12/2022 1625 Last data filed at 07/12/2022 5643 Gross per 24 hour  Intake 1923.24 ml  Output 350 ml  Net 1573.24 ml   Filed Weights   07/09/22 1700 07/11/22 0457 07/12/22 0500  Weight: 68 kg 63.4 kg 67 kg   Examination: Physical Exam:  Constitutional: WN/WD elderly overweight chronically ill-appearing Caucasian female in mild distress Respiratory: Diminished to auscultation bilaterally coarse breath sounds, no wheezing, rales, rhonchi or crackles. Normal respiratory effort and patient is not tachypenic. No accessory muscle use.  Cardiovascular: Irregularly irregular, no murmurs / rubs / gallops. S1 and S2 auscultated.  Has 1+ to 2+ lower extremity pitting edema bilaterally Abdomen: Soft, slightly-tender, distended secondary body habitus. Bowel sounds positive.  GU: Deferred. Musculoskeletal: No clubbing / cyanosis of digits/nails. No joint deformity upper and lower extremities Skin: No rashes, lesions, ulcers on limited skin evaluation. No induration; Warm and dry.  Neurologic: CN 2-12 grossly intact with no focal deficits. Romberg sign and cerebellar reflexes not assessed.  Psychiatric: Normal judgment and insight. Alert and oriented x 3. Normal mood and appropriate affect.   Data Reviewed: I have personally reviewed following labs and imaging studies  CBC: Recent Labs  Lab 07/07/22 1206 07/08/22 1932 07/09/22 0446 07/09/22 1524 07/10/22 0612 07/11/22 0000 07/11/22 0410 07/11/22 1339 07/12/22 0433  WBC 7.1 6.5 10.1  --  13.4*  --  9.5  --  7.4  NEUTROABS 6.5  --  8.0*  --   --   --   --   --   --   HGB 8.7* 5.6* 7.1*   < > 7.1* 9.4* 9.1* 8.6* 8.4*  HCT 25.7* 17.6* 21.1*   < > 21.2*  27.6* 27.1* 25.7* 25.5*  MCV 91.1 96.7 89.8  --  91.8  --  92.8  --  95.5  PLT 136* 148* 149*  --  136*  --  104*  --  109*   < > = values in this interval not displayed.   Basic Metabolic Panel: Recent Labs  Lab 07/08/22 1932 07/09/22 0446 07/10/22 0612 07/11/22 0410 07/12/22 0433  NA 132* 135 133* 138 140  K 5.2* 4.6 4.8 4.4 4.2  CL 102 107 105 110 109  CO2 19* 18* 17* 18* 19*  GLUCOSE 354* 321* 379* 229* 80  BUN 138* 139* 156* 141* 121*  CREATININE 2.77* 2.69* 3.41* 3.29* 2.95*  CALCIUM 7.9* 7.6* 7.7* 8.1* 8.3*  MG  --  2.0  --  2.3  --    GFR: Estimated Creatinine Clearance: 12 mL/min (A) (by C-G formula based on SCr of 2.95 mg/dL (H)). Liver Function Tests: Recent Labs  Lab 07/07/22 1206 07/08/22 1932 07/09/22  0446  AST '25 23 22  '$ ALT '15 16 16  '$ ALKPHOS 71 53 49  BILITOT 0.6 0.7 0.7  PROT 5.3* 4.3* 4.1*  ALBUMIN 3.3* 2.5* 2.4*   Recent Labs  Lab 07/08/22 1932  LIPASE 51   No results for input(s): "AMMONIA" in the last 168 hours. Coagulation Profile: Recent Labs  Lab 07/08/22 2207 07/09/22 0446 07/10/22 0612 07/11/22 0410 07/12/22 0433  INR 5.4* 2.1* 1.5* 1.5* 1.5*   Cardiac Enzymes: No results for input(s): "CKTOTAL", "CKMB", "CKMBINDEX", "TROPONINI" in the last 168 hours. BNP (last 3 results) No results for input(s): "PROBNP" in the last 8760 hours. HbA1C: No results for input(s): "HGBA1C" in the last 72 hours. CBG: Recent Labs  Lab 07/11/22 1957 07/12/22 0038 07/12/22 0416 07/12/22 0725 07/12/22 1143  GLUCAP 106* 82 87 88 117*   Lipid Profile: No results for input(s): "CHOL", "HDL", "LDLCALC", "TRIG", "CHOLHDL", "LDLDIRECT" in the last 72 hours. Thyroid Function Tests: No results for input(s): "TSH", "T4TOTAL", "FREET4", "T3FREE", "THYROIDAB" in the last 72 hours. Anemia Panel: No results for input(s): "VITAMINB12", "FOLATE", "FERRITIN", "TIBC", "IRON", "RETICCTPCT" in the last 72 hours. Sepsis Labs: Recent Labs  Lab 07/08/22 2215  07/10/22 0612 07/11/22 0410  LATICACIDVEN 2.7* 3.6* 0.8    Recent Results (from the past 240 hour(s))  C Difficile Quick Screen w PCR reflex     Status: None   Collection Time: 07/08/22 11:03 PM   Specimen: STOOL  Result Value Ref Range Status   C Diff antigen NEGATIVE NEGATIVE Final   C Diff toxin NEGATIVE NEGATIVE Final   C Diff interpretation No C. difficile detected.  Final    Comment: Performed at [facility], [place] [address] [place],  [address]    Radiology Studies: No results found.  Scheduled Meds:  sodium chloride   Intravenous Once   acetaminophen  1,000 mg Oral TID   ALPRAZolam  0.5 mg Oral QHS   amLODipine  10 mg Oral Daily   hydrALAZINE  100 mg Oral TID   insulin aspart  0-20 Units Subcutaneous TID WC   insulin aspart  3 Units Subcutaneous TID WC   insulin glargine-yfgn  20 Units Subcutaneous Daily   metoprolol succinate  12.5 mg Oral BID   [START ON 07/16/2022] pantoprazole  40 mg Intravenous Q12H   [START ON 07/17/2022] predniSONE  10 mg Oral Q breakfast   predniSONE  15 mg Oral Q breakfast   [START ON 07/22/2022] predniSONE  5 mg Oral Q breakfast   tadalafil  40 mg Oral QHS   vancomycin  125 mg Oral BID   Continuous Infusions:  sodium chloride     sodium chloride 75 mL/hr at 07/12/22 1252   sodium chloride     pantoprazole 8 mg/hr (07/12/22 1258)   piperacillin-tazobactam (ZOSYN)  IV      LOS: 4 days   Raiford Noble, DO [facility] Available via Epic secure chat 7am-7pm After these hours, please refer to coverage provider listed on [email or web address] 07/12/2022, 4:25 PM

## 2022-07-12 NOTE — Plan of Care (Signed)
  Problem: Education: Goal: Knowledge of General Education information will improve Description: Including pain rating scale, medication(s)/side effects and non-pharmacologic comfort measures Outcome: Progressing   Problem: Coping: Goal: Level of anxiety will decrease Outcome: Progressing   Problem: Elimination: Goal: Will not experience complications related to urinary retention Outcome: Progressing   Problem: Pain Managment: Goal: General experience of comfort will improve Outcome: Progressing   Problem: Safety: Goal: Ability to remain free from injury will improve Outcome: Progressing   Problem: Skin Integrity: Goal: Risk for impaired skin integrity will decrease Outcome: Progressing   

## 2022-07-12 NOTE — H&P (View-Only) (Signed)
[facility] Gastroenterology Progress Note  [name] 81 y.o. [birth year]  Subjective: Patient seen and examined laying in bed, reports one episode of melena today. No emesis, abdominal pain slightly improved.   Objective: Vital signs in last 24 hours: Vitals:   07/11/22 2001 07/12/22 0421  BP: (!) 149/48 139/60  Pulse: 100 93  Resp: 17 19  Temp: 98.2 F (36.8 C) 98.3 F (36.8 C)  SpO2: 99% 100%    Physical Exam:  General:  Alert, cooperative, no distress, appears stated age  Head:  Normocephalic, without obvious abnormality, atraumatic  Eyes:  Anicteric sclera, EOM's intact  Lungs:   Clear to auscultation bilaterally, respirations unlabored  Heart:  Irregularly irregular rhythm, S1, S2 normal  Abdomen:   Soft, generalized tenderness worse to the epigastric region, bowel sounds active all four quadrants,  no masses,   Extremities: Extremities normal, atraumatic, no  edema  Pulses: 2+ and symmetric    Lab Results: Recent Labs    07/11/22 0410 07/12/22 0433  NA 138 140  K 4.4 4.2  CL 110 109  CO2 18* 19*  GLUCOSE 229* 80  BUN 141* 121*  CREATININE 3.29* 2.95*  CALCIUM 8.1* 8.3*  MG 2.3  --    No results for input(s): "AST", "ALT", "ALKPHOS", "BILITOT", "PROT", "ALBUMIN" in the last 72 hours. Recent Labs    07/11/22 0410 07/11/22 1339 07/12/22 0433  WBC 9.5  --  7.4  HGB 9.1* 8.6* 8.4*  HCT 27.1* 25.7* 25.5*  MCV 92.8  --  95.5  PLT 104*  --  109*   Recent Labs    07/11/22 0410 07/12/22 0433  LABPROT 18.2* 18.0*  INR 1.5* 1.5*      Assessment Melena Anemia Chronic anticoagulation A-fib Abdominal pain   Abdominal pain much improved on Dilaudid.  Patient overall feeling improved.   She did have 1 episode of melena today. Hgb 9.1(8.6) (patient received 2 units packed red blood cells 10/2).  EGD 10/2 was canceled due to elevated heart rate and abdominal pain.   CT AP 07/08/2022 Marked duodenitis/duodenal diverticulitis, no evidence of  perforation   Would consider EGD for further evaluation of melena after inflammation has improved.     Plan: Plan for EGD tomorrow. I thoroughly discussed the procedures to include nature, alternatives, benefits, and risks including but not limited to bleeding, perforation, infection, anesthesia/cardiac and pulmonary complications. Patient provides understanding and gave verbal consent to proceed. Start Protonix IV '8mg'$ /h drip. Continue IV antibiotics. Continue clear liquid diet. NPO at midnight. Continue anti-emetics and supportive care as needed. [facility] will follow.     Arvella Nigh Aireonna Bauer PA-C 07/12/2022, 12:21 PM  Contact #  [phone]

## 2022-07-13 ENCOUNTER — Inpatient Hospital Stay (HOSPITAL_COMMUNITY): Payer: Medicare HMO | Admitting: Anesthesiology

## 2022-07-13 ENCOUNTER — Encounter (HOSPITAL_COMMUNITY)
Admission: EM | Disposition: A | Discharge status: Skilled Nursing Facility | Payer: Self-pay | Source: Home / Self Care | Attending: Internal Medicine

## 2022-07-13 ENCOUNTER — Encounter (HOSPITAL_COMMUNITY): Payer: Self-pay | Admitting: Internal Medicine

## 2022-07-13 DIAGNOSIS — R600 Localized edema: Secondary | ICD-10-CM | POA: Diagnosis not present

## 2022-07-13 DIAGNOSIS — K3189 Other diseases of stomach and duodenum: Secondary | ICD-10-CM

## 2022-07-13 DIAGNOSIS — D62 Acute posthemorrhagic anemia: Secondary | ICD-10-CM | POA: Diagnosis not present

## 2022-07-13 DIAGNOSIS — I509 Heart failure, unspecified: Secondary | ICD-10-CM

## 2022-07-13 DIAGNOSIS — Z66 Do not resuscitate: Secondary | ICD-10-CM | POA: Diagnosis not present

## 2022-07-13 DIAGNOSIS — K5710 Diverticulosis of small intestine without perforation or abscess without bleeding: Secondary | ICD-10-CM | POA: Diagnosis not present

## 2022-07-13 DIAGNOSIS — D649 Anemia, unspecified: Secondary | ICD-10-CM | POA: Diagnosis not present

## 2022-07-13 DIAGNOSIS — Z87891 Personal history of nicotine dependence: Secondary | ICD-10-CM

## 2022-07-13 DIAGNOSIS — D591 Autoimmune hemolytic anemia, unspecified: Secondary | ICD-10-CM | POA: Diagnosis not present

## 2022-07-13 DIAGNOSIS — K229 Disease of esophagus, unspecified: Secondary | ICD-10-CM

## 2022-07-13 DIAGNOSIS — Z515 Encounter for palliative care: Secondary | ICD-10-CM | POA: Diagnosis not present

## 2022-07-13 DIAGNOSIS — I2510 Atherosclerotic heart disease of native coronary artery without angina pectoris: Secondary | ICD-10-CM

## 2022-07-13 DIAGNOSIS — Z7189 Other specified counseling: Secondary | ICD-10-CM | POA: Diagnosis not present

## 2022-07-13 DIAGNOSIS — I110 Hypertensive heart disease with heart failure: Secondary | ICD-10-CM

## 2022-07-13 DIAGNOSIS — K922 Gastrointestinal hemorrhage, unspecified: Secondary | ICD-10-CM | POA: Diagnosis not present

## 2022-07-13 HISTORY — PX: ESOPHAGOGASTRODUODENOSCOPY: SHX5428

## 2022-07-13 LAB — GLUCOSE, CAPILLARY
Glucose-Capillary: 102 mg/dL — ABNORMAL HIGH (ref 70–99)
Glucose-Capillary: 108 mg/dL — ABNORMAL HIGH (ref 70–99)
Glucose-Capillary: 164 mg/dL — ABNORMAL HIGH (ref 70–99)
Glucose-Capillary: 201 mg/dL — ABNORMAL HIGH (ref 70–99)
Glucose-Capillary: 59 mg/dL — ABNORMAL LOW (ref 70–99)
Glucose-Capillary: 75 mg/dL (ref 70–99)
Glucose-Capillary: 94 mg/dL (ref 70–99)
Glucose-Capillary: 98 mg/dL (ref 70–99)

## 2022-07-13 LAB — PROTIME-INR
INR: 1.6 — ABNORMAL HIGH (ref 0.8–1.2)
Prothrombin Time: 19.1 seconds — ABNORMAL HIGH (ref 11.4–15.2)

## 2022-07-13 LAB — COMPREHENSIVE METABOLIC PANEL
ALT: 14 U/L (ref 0–44)
AST: 22 U/L (ref 15–41)
Albumin: 2.6 g/dL — ABNORMAL LOW (ref 3.5–5.0)
Alkaline Phosphatase: 49 U/L (ref 38–126)
Anion gap: 8 (ref 5–15)
BUN: 97 mg/dL — ABNORMAL HIGH (ref 8–23)
CO2: 19 mmol/L — ABNORMAL LOW (ref 22–32)
Calcium: 8.2 mg/dL — ABNORMAL LOW (ref 8.9–10.3)
Chloride: 113 mmol/L — ABNORMAL HIGH (ref 98–111)
Creatinine, Ser: 2.78 mg/dL — ABNORMAL HIGH (ref 0.44–1.00)
GFR, Estimated: 16 mL/min — ABNORMAL LOW (ref >60)
Glucose, Bld: 68 mg/dL — ABNORMAL LOW (ref 70–99)
Potassium: 4.1 mmol/L (ref 3.5–5.1)
Sodium: 140 mmol/L (ref 135–145)
Total Bilirubin: 0.6 mg/dL (ref 0.3–1.2)
Total Protein: 4.4 g/dL — ABNORMAL LOW (ref 6.5–8.1)

## 2022-07-13 LAB — CBC WITH DIFFERENTIAL/PLATELET
Abs Immature Granulocytes: 0.04 10*3/uL (ref 0.00–0.07)
Basophils Absolute: 0 10*3/uL (ref 0.0–0.1)
Basophils Relative: 0 %
Eosinophils Absolute: 0.1 10*3/uL (ref 0.0–0.5)
Eosinophils Relative: 1 %
HCT: 24.5 % — ABNORMAL LOW (ref 36.0–46.0)
Hemoglobin: 7.9 g/dL — ABNORMAL LOW (ref 12.0–15.0)
Immature Granulocytes: 1 %
Lymphocytes Relative: 16 %
Lymphs Abs: 1.1 10*3/uL (ref 0.7–4.0)
MCH: 31.1 pg (ref 26.0–34.0)
MCHC: 32.2 g/dL (ref 30.0–36.0)
MCV: 96.5 fL (ref 80.0–100.0)
Monocytes Absolute: 0.5 10*3/uL (ref 0.1–1.0)
Monocytes Relative: 7 %
Neutro Abs: 5 10*3/uL (ref 1.7–7.7)
Neutrophils Relative %: 75 %
Platelets: 104 10*3/uL — ABNORMAL LOW (ref 150–400)
RBC: 2.54 MIL/uL — ABNORMAL LOW (ref 3.87–5.11)
RDW: 18.4 % — ABNORMAL HIGH (ref 11.5–15.5)
WBC: 6.6 10*3/uL (ref 4.0–10.5)
nRBC: 0.6 % — ABNORMAL HIGH (ref 0.0–0.2)

## 2022-07-13 LAB — MAGNESIUM: Magnesium: 2.4 mg/dL (ref 1.7–2.4)

## 2022-07-13 LAB — PHOSPHORUS: Phosphorus: 5 mg/dL — ABNORMAL HIGH (ref 2.5–4.6)

## 2022-07-13 SURGERY — EGD (ESOPHAGOGASTRODUODENOSCOPY)
Anesthesia: Monitor Anesthesia Care

## 2022-07-13 MED ORDER — DEXTROSE 50 % IV SOLN
12.5000 g | INTRAVENOUS | Status: AC
  Administered 2022-07-13: 12.5 g via INTRAVENOUS
  Filled 2022-07-13: qty 50

## 2022-07-13 MED ORDER — PROPOFOL 500 MG/50ML IV EMUL
INTRAVENOUS | Status: AC
  Filled 2022-07-13: qty 50

## 2022-07-13 MED ORDER — PROPOFOL 10 MG/ML IV BOLUS
INTRAVENOUS | Status: DC | PRN
  Administered 2022-07-13: 20 mg via INTRAVENOUS
  Administered 2022-07-13: 10 mg via INTRAVENOUS

## 2022-07-13 MED ORDER — PROPOFOL 500 MG/50ML IV EMUL
INTRAVENOUS | Status: DC | PRN
  Administered 2022-07-13: 50 ug/kg/min via INTRAVENOUS

## 2022-07-13 MED ORDER — PANTOPRAZOLE SODIUM 40 MG PO TBEC
40.0000 mg | DELAYED_RELEASE_TABLET | Freq: Every day | ORAL | Status: DC
  Administered 2022-07-14: 40 mg via ORAL
  Filled 2022-07-13: qty 1

## 2022-07-13 MED ORDER — FLUCONAZOLE 100 MG PO TABS: 100.0000 mg | ORAL_TABLET | Freq: Every day | ORAL | Status: DC

## 2022-07-13 MED ORDER — LACTATED RINGERS IV SOLN: INTRAVENOUS | Status: DC | PRN

## 2022-07-13 MED ORDER — DEXTROSE 50 % IV SOLN
25.0000 mL | Freq: Once | INTRAVENOUS | Status: AC
  Administered 2022-07-13: 25 mL via INTRAVENOUS
  Filled 2022-07-13: qty 50

## 2022-07-13 MED ORDER — INSULIN ASPART 100 UNIT/ML IJ SOLN
0.0000 [IU] | Freq: Three times a day (TID) | INTRAMUSCULAR | Status: DC
  Administered 2022-07-13: 3 [IU] via SUBCUTANEOUS
  Administered 2022-07-14: 1 [IU] via SUBCUTANEOUS

## 2022-07-13 MED ORDER — INSULIN GLARGINE-YFGN 100 UNIT/ML ~~LOC~~ SOLN
10.0000 [IU] | Freq: Every day | SUBCUTANEOUS | Status: DC
  Administered 2022-07-14: 10 [IU] via SUBCUTANEOUS
  Filled 2022-07-13: qty 0.1

## 2022-07-13 MED ORDER — FLUCONAZOLE 100 MG PO TABS
100.0000 mg | ORAL_TABLET | Freq: Every day | ORAL | Status: DC
  Administered 2022-07-13 – 2022-07-14 (×2): 100 mg via ORAL
  Filled 2022-07-13 (×3): qty 1

## 2022-07-13 NOTE — Op Note (Signed)
[facility] Patient Name: [name] Procedure Date: 07/13/2022 MRN: [id number] Attending MD: [name] , MD Date of Birth: 16-Jan-1941 CSN: [id number] Age: 81 Admit Type: Inpatient Procedure:                Upper GI endoscopy Indications:              Acute post hemorrhagic anemia, Melena, abnormal CT-                            duodenal diverticulitis, right colon(ascending and                            cecum) diverticulitis Providers:                [name], MD, Benay Pillow, RN, Darliss Cheney,                            Technician Referring MD:             [facility] Medicines:                Monitored Anesthesia Care Complications:            No immediate complications. Estimated Blood Loss:     Estimated blood loss: none. Procedure:                Pre-Anesthesia Assessment:                           - Prior to the procedure, a History and Physical                            was performed, and patient medications and                            allergies were reviewed. [name] tolerance of                            previous anesthesia was also reviewed. The risks                            and benefits of the procedure and the sedation                            options and risks were discussed with the patient.                            All questions were answered, and informed consent                            was obtained. Prior Anticoagulants: The patient has                            taken Coumadin (warfarin), last dose was 7 days                            prior to  procedure. ASA Grade Assessment: IV - A                            patient with severe systemic disease that is a                            constant threat to life. After reviewing the risks                            and benefits, the patient was deemed in                            satisfactory condition to undergo the procedure.                           After obtaining  informed consent, the endoscope was                            passed under direct vision. Throughout the                            procedure, the patient's blood pressure, pulse, and                            oxygen saturations were monitored continuously. The                            [id number]-H190 ([id number]) Olympus endoscope was introduced                            through the mouth, and advanced to the second part                            of duodenum. The upper GI endoscopy was                            accomplished without difficulty. The patient                            tolerated the procedure well. Scope In: Scope Out: Findings:      Diffuse, white plaques were found in the entire esophagus.      The Z-line was regular and was found 36 cm from the incisors.      A few dispersed small erosions with no bleeding and no stigmata of       recent bleeding were found in the gastric fundus and in the gastric body.      The cardia and gastric fundus were normal on retroflexion.      A non-bleeding diverticulum was found in the second portion of the       duodenum.      Diffuse, mild mucosal changes characterized by dark pigmentation of       mucosa noted were found in the entire duodenum.      There was no evidence of recent or active bleeding noted. Impression:               -  Esophageal plaques were found, consistent with                            candidiasis.                           - Z-line regular, 36 cm from the incisors.                           - Erosive gastropathy with no bleeding and no                            stigmata of recent bleeding.                           - Non-bleeding duodenal diverticulum.                           - Mucosal changes in the duodenum.                           - No specimens collected. Moderate Sedation:      Patient did not receive moderate sedation for this procedure, but       instead received monitored anesthesia  care. Recommendation:           - Diflucan 100 mg po daily for 7 days.                           - Resume regular diet.                           - Pantoprazole 40 mg once a day.                           - Continue antibiotics(IV while in hospital and PO                            when discharged) for a total of 10 days for                            dudoenal and right colon diverticulitis noted on CT.                           - Resume Warfarin in 3 days if Hb remains stable Procedure Code(s):        --- Professional ---                           [phone], Esophagogastroduodenoscopy, flexible,                            transoral; diagnostic, including collection of                            specimen(s) by brushing or washing, when performed                            (  separate procedure) Diagnosis Code(s):        --- Professional ---                           K22.9, Disease of esophagus, unspecified                           K31.89, Other diseases of stomach and duodenum                           D62, Acute posthemorrhagic anemia                           K92.1, Melena (includes Hematochezia)                           K57.10, Diverticulosis of small intestine without                            perforation or abscess without bleeding CPT copyright 2019 [employer] Association. All rights reserved. The codes documented in this report are preliminary and upon coder review may  be revised to meet current compliance requirements. [name], MD 07/13/2022 2:04:09 PM This report has been signed electronically. Number of Addenda: 0

## 2022-07-13 NOTE — Anesthesia Preprocedure Evaluation (Signed)
Anesthesia Evaluation  Patient identified by MRN, date of birth, ID band Patient awake    Reviewed: Allergy & Precautions, NPO status , Patient's Chart, lab work & pertinent test results  Airway Mallampati: II       Dental   Pulmonary shortness of breath, former smoker,    breath sounds clear to auscultation       Cardiovascular hypertension, + CAD, + Peripheral Vascular Disease and +CHF   Rhythm:Regular Rate:Normal     Neuro/Psych PSYCHIATRIC DISORDERS    GI/Hepatic Neg liver ROS, GERD  ,  Endo/Other  diabetes  Renal/GU Renal disease     Musculoskeletal  (+) Arthritis ,   Abdominal   Peds  Hematology   Anesthesia Other Findings   Reproductive/Obstetrics                             Anesthesia Physical Anesthesia Plan  ASA: 3  Anesthesia Plan: MAC   Post-op Pain Management:    Induction: Intravenous  PONV Risk Score and Plan: 2 and Propofol infusion and Treatment may vary due to age or medical condition  Airway Management Planned: Nasal Cannula and Simple Face Mask  Additional Equipment:   Intra-op Plan:   Post-operative Plan:   Informed Consent: I have reviewed the patients History and Physical, chart, labs and discussed the procedure including the risks, benefits and alternatives for the proposed anesthesia with the patient or authorized representative who has indicated his/her understanding and acceptance.     Dental advisory given  Plan Discussed with: CRNA and Anesthesiologist  Anesthesia Plan Comments:         Anesthesia Quick Evaluation

## 2022-07-13 NOTE — Anesthesia Postprocedure Evaluation (Signed)
Anesthesia Post Note  Patient: [name]  Procedure(s) Performed: ESOPHAGOGASTRODUODENOSCOPY (EGD)     Patient location during evaluation: Endoscopy Anesthesia Type: MAC Level of consciousness: awake Pain management: pain level controlled Respiratory status: spontaneous breathing Cardiovascular status: stable Postop Assessment: no apparent nausea or vomiting Anesthetic complications: no   No notable events documented.  Last Vitals:  Vitals:   07/13/22 1410 07/13/22 1420  BP: (!) 175/65 (!) 153/81  Pulse: 94 88  Resp: (!) 21 19  Temp:    SpO2: 99% 98%    Last Pain:  Vitals:   07/13/22 1400  TempSrc:   PainSc: 5                  Rexine Gowens

## 2022-07-13 NOTE — Inpatient Diabetes Management (Signed)
Inpatient Diabetes Program Recommendations  AACE/ADA: New Consensus Statement on Inpatient Glycemic Control (2015)  Target Ranges:  Prepandial:   less than 140 mg/dL      Peak postprandial:   less than 180 mg/dL (1-2 hours)      Critically ill patients:  140 - 180 mg/dL   Lab Results  Component Value Date   GLUCAP 102 (H) 07/13/2022   HGBA1C 6.3 (H) 04/27/2022    Review of Glycemic Control  Latest Reference Range & Units 07/13/22 04:07 07/13/22 06:34 07/13/22 07:12 07/13/22 07:47  Glucose-Capillary 70 - 99 mg/dL 75 59 (L) 164 (H) 102 (H)  (L): Data is abnormally low (H): Data is abnormally high Diabetes history: Type 2 DM Current orders for Inpatient glycemic control: Semglee 20 units QD, Novolog 3 units  TID, Novolog 0-20 units TID Prednisone 15 mg QD Inpatient Diabetes Program Recommendations:    Noted hypoglycemia this AM.  Consider: - reducing correction to Novolog 0-9 units TID & HS - reducing Semglee 10 units qD Secure chat sent  Thanks, Bronson Curb, MSN, RNC-OB Diabetes Coordinator [phone] (8a-5p)

## 2022-07-13 NOTE — Progress Notes (Signed)
Daily Progress Note   Patient Name: [name]       Date: 07/13/2022 DOB: 06-Dec-1940  Age: 81 y.o. MRN#: [id number] Attending Physician: Kerney Elbe, DO Primary Care Physician: [name], MD Admit Date: 07/08/2022 Length of Stay: 5 days  Reason for Consultation/Follow-up: Establishing goals of care and Pain control  Subjective:   CC: Patient notes abdominal pain greatly improved today as compared to yesterday..  Subjective:  Resting in bed, at family request, a goals of care family meeting was held outside the patient's room, see below.       Review of Systems  Constitutional:  Positive for fatigue.  HENT:  Negative for trouble swallowing.   Respiratory:  Negative for apnea and shortness of breath.   Cardiovascular:  Negative for chest pain.  Gastrointestinal:  Positive for abdominal distention and abdominal pain. Negative for nausea.  Neurological:  Positive for weakness.  Psychiatric/Behavioral:  Negative for agitation and confusion.    Objective:   Vital Signs:  BP (!) 153/51 (BP Location: Left Arm)   Pulse 85   Temp (!) 97.5 F (36.4 C) (Oral)   Resp 16   Ht '4\' 11"'$  (1.499 m)   Wt 67 kg   SpO2 97%   BMI 29.83 kg/m   Physical Exam: General: NAD, alert, laying in bed Eyes: conjunctiva clear, anicteric sclera HENT: normocephalic, atraumatic, moist mucous membranes Cardiovascular: tachycardia noted Respiratory: no increase work of breathing noted, not in respiratory distress Abdomen: tender to palpation Extremities: chronic venous statis in LE b/l Skin: no rash noted on visible skin Neuro: A&Ox4 Psych: anxious affect  Assessment & Plan:   Assessment: Patient is a 81 year old female with a past medical history of type 2 diabetes, autoimmune hemolytic anemia, hyperlipidemia, CKD stage IV, 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 difficile colitis who  was admitted on 9/30 for GI bleed diverticulitis.  Out of consulted to assist with complex medical decision making.  Recommendations/Plan: # Complex medical decision making/goals of care:  -GI following, EGD today.   -Encouraged completion of HCPOA/ACP documentation.  Patient and son will review and inform staff if wanting to complete while hospitalized.  -Currently finding returns to the hospital "beneficial".  We did discuss that should patient ever deem hospitalizations and outpatient follow-up with other providers is becoming too burdensome and she would like to focus on quality time at home with family, could consider transition to hospice at that time.  As patient wants to new with current medical interventions, place consult for [facility] to assist with referral for home palliative care.  Patient and son appreciate continued palliative care involvement.  -  Code Status: DNR  # Symptom management   -Pain, acute abdominal secondary to diverticulitis   Pain currently improved.   -  Dilaudid 1-2 mg p.o. solution every 6 hours as needed for moderate or severe pain.  Oral medication should be given prior to IV medication.  Should the medication not be lasting between the 6-hour intervals, could adjust frequency to every 4 hours as needed.   - Continue Dilaudid 1 mg every 3 hours as needed for severe pain.   -Started Tylenol 1000 mg 3 times daily scheduled.  # Discharge Planning: SNF rehab with palliative care for continued follow-up.  Discussed with: Patient, patient's son, daughters and RN and TOC.   Thank you for allowing [place] to participate in the care of [name]  This provider  spent a total of 54 minutes providing patient's care.  Includes review of EMR, discussing care with other staff members involved in patient's medical care, obtaining relevant history and information from patient and/or patient's family, coordination of care, and personal review of imaging and lab work. Greater than  50% of the time was spent counseling and coordinating care related to the above assessment and plan.    Mod MDM Loistine Chance MD Palliative Care Provider Team Phone # [phone] (Nights/Weekends)

## 2022-07-13 NOTE — Progress Notes (Signed)
Per Dr. Inda Castle request, notified that pt's son & daughter both here.

## 2022-07-13 NOTE — TOC Progression Note (Signed)
Transition of Care [facility]) - Progression Note    Patient Details  Name: SHERYLE VICE MRN: [id number] Date of Birth: Jun 22, 1941  Transition of Care [facility]) CM/SW [place], RN Phone Number: 07/13/2022, 12:54 PM  Clinical Narrative:   Presented bed offers to patient's son Thurmond Butts and daughter [name], awaiting choice.  TOC will continue to follow.    Expected Discharge Plan: [place] Barriers to Discharge: Continued Medical Work up  Expected Discharge Plan and Services Expected Discharge Plan: [place] In-house Referral: NA Discharge Planning Services: CM Consult Post Acute Care Choice: [place] Living arrangements for the past 2 months: Single Family Home                 DME Arranged: N/A DME Agency: NA       HH Arranged: NA HH Agency: NA         Social Determinants of Health (SDOH) Interventions    Readmission Risk Interventions    04/20/2022   12:02 PM  Readmission Risk Prevention Plan  Transportation Screening Complete  Medication Review (RN Care Manager) Complete  PCP or Specialist appointment within 3-5 days of discharge Complete  HRI or [place] Complete  SW Recovery Care/Counseling Consult Complete  [place] Not Applicable

## 2022-07-13 NOTE — Progress Notes (Signed)
Secured chatted team with following. "This is an update for all involved with this pt. Daughter's brother will be here around 1200-1300. They would like to discuss in private, plan of care going forward, without pt in room. The pt is extremely anxious, & had an anxiety attack this am. Daughter is not refusing to speak with team in front of pt, just wants to speak without pt present prior to, while brother is present, if possible. Stated it has been very difficult for family to be present, at same times"

## 2022-07-13 NOTE — Transfer of Care (Signed)
Immediate Anesthesia Transfer of Care Note  Patient: [name]  Procedure(s) Performed: ESOPHAGOGASTRODUODENOSCOPY (EGD)  Patient Location: PACU  Anesthesia Type:MAC  Level of Consciousness: sedated  Airway & Oxygen Therapy: Patient Spontanous Breathing and Patient connected to face mask oxygen  Post-op Assessment: Report given to RN and Post -op Vital signs reviewed and stable  Post vital signs: Reviewed and stable  Last Vitals:  Vitals Value Taken Time  BP    Temp    Pulse    Resp    SpO2      Last Pain:  Vitals:   07/13/22 1255  TempSrc: Oral  PainSc: 5       Patients Stated Pain Goal: 0 (32/54/98 2641)  Complications: No notable events documented.

## 2022-07-13 NOTE — Progress Notes (Signed)
PROGRESS NOTE    [name]  UXN:[id number] DOB: [birth year] DOA: 07/08/2022 PCP: [name], MD   Brief Narrative:  [name] is a 81 y.o. 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 difficile colitis who presented to [facility] ED on 9/30 via EMS with abdominal pain, diarrhea, dark stools.  Patient was seen by her oncologist day prior to admission who follows her for her hemolytic anemia.  She has been unable to wean off of prednisone due to worsening anemia.  Following return from her doctor's appointment, patient reports a sudden urge to defecate with notable melanotic stools around 6 PM.  She reports consistent melanotic stools all night associated with weakness/fatigue.  Also with some generalized abdominal pain but denies fever.  Given persistence of symptoms, patient called EMS and was brought to the ED for further evaluation.   In the ED, temperature 97.6, HR 89, RR 16, BP 133/50, SPO2 100% on room air.  WBC 6.5, hemoglobin 5.6, platelets 148.  Sodium 132, potassium 5.2, chloride 102, CO2 19, glucose 354, BUN 138, creatinine 2.77.  Lipase 51.  AST 23, ALT 16, total bilirubin 0.7.  Lactic acid 2.7.  INR 5.4.  FOBT positive.  C. difficile negative.  CT abdomen/pelvis with marked duodenitis/duodenal diverticulitis no evidence of perforation with questionable mild diverticulitis ascending colon and cecum.  EDP ordered vitamin K for INR reversal and 2 unit PRBCs.  TRH consulted for admission and further evaluation and management of acute GI bleed in setting of supratherapeutic INR.  Currently going further work-up and is undergoing EGD today  Assessment and Plan: Acute blood loss/symptomatic anemia Acute  upper GI bleed Hx iron deficiency/anemia of chronic medical/renal disease -Patient presenting to ED with abdominal discomfort, fatigue associated with recurrent melanotic stools.   -Hemoglobin 5.6 on admission (recently 8.7 on 9/29 and 9.6 on 9/21).   -Additionally BUN elevated at 139 (was 84 on 9/17) and FOBT positive.   -Additionally INR elevated at 5.4 which is likely contributing factor.   -Etiology likely secondary to upper GI bleed.  Received IV vitamin K per EDP. [facility] following and plan is for EGD in the AM  -Transfused 5 units total as she was given 2 units pRBC on 9/30, 1 unit 10/1, 2u 10/2 --Hgb 5.4>7.1>8.7>8.7>7.1>9.4>9.1 and now Hgb/Hct hast gone to 8.6/25.7 -> 8.4/25.5 and is now slightly on the lower side at 7.9/24.5 -- INR 5.4>2.1>1.5>1.5 x2 -> and now INR is 1.5 --Continuting  Protonix drip -- Holding Coumadin still -- Given cardiac history, goal Hgb >/= 8.0 --Continue to Monitor on telemetry, supportive care; GI is planning for an EGD today given that she continues to have some black stools   Acute Diverticulitis Lactic acidosis CT abdomen/pelvis with marked duodenitis/duodenal diverticulitis, no evidence of perforation with questionable diverticulitis ascending colon/cecum.  Patient was complaining of some abdominal discomfort. --C/w IV Zosyn for duodenitis/duodenal diverticulitis and possible right-sided colonic diverticulitis --Oral vancomycin twice daily while on antibiotics for C. difficile colitis prophylaxis --Clear liquid diet yesterday but is n.p.o. today for her EGD --CBC daily   Paroxysmal atrial fibrillation Hx mechanical aortic valve replacement on Coumadin Supratherapeutic INR -INR notably elevated at 5.4 on admission, goal 2.0-3.0.  In the setting of active bleeding as above.  Now PT/INR is 19.1/1.6 --Continue to  hold Coumadin/aspirin and will resume hopefully soon pending GI work-up and evaluation --INR daily   Autoimmune hemolytic anemia ANCA  vasculitis -Follows with hematology, Dr. Chryl Heck outpatient.   -Maintained on prednisone 20 mg p.o. daily.  Continue outpatient follow-up. --Continue Prednisone taper per oncology   Acute renal failure on CKD stage IV Metabolic acidosis -Baseline creatinine 2.2-2.4. -Creatinine on admission 2.77; etiology likely secondary to dehydration versus acute blood loss anemia with ATN given GI bleed as above.   -Refused sodium bicarb in the past due to "side effects". --Cr 2.77>2.69>3.41>3.29 and albumin/creatinine is 121/2.95 yesterday and today is now 97/2.78 --s/p 5u pRBC as above -Patient's CO2 is now 19, anion gap is 8, chloride level is 113 --NS at 75 mL/hr now stopped given that her lower extremities are swollen -Avoid further nephrotoxic medications, contrast dyes, hypotension and dehydration to ensure adequate renal perfusion monitoring adjust medications -Continue monitor and trend renal function carefully and repeat CMP in the a.m.   Type 2 diabetes mellitus -Home regimen includes Tresiba 9-14 units subcutaneously daily, NovoLog insulin sliding scale.  Hemoglobin A1c 6.3 on 04/27/2022, well controlled. -SSI for coverage and this was reduced to 0 to 9 units subcu 3 times daily with meals given that she was hypoglycemic this morning -Reduced her long-acting Semglee from 20 units nightly to 10 units -Cbgs QAC/HS -CBGs ranging from 59-164   Chronic diastolic congestive heart failure -Home regimen includes metoprolol succinate 12.5 mg p.o. daily, furosemide 40 mg p.o. twice daily, spironolactone 25 mg p.o. daily. -- Continue metoprolol succinate 12.5 mg p.o. BID -- Hold home spironolactone and furosemide for now -We will stop IV fluid hydration given that her legs are getting more swollen and because she is +5.\895 L -- Strict I's and O's and daily weights -Continue to Monitor and trend and repeat CMP daily   Essential hypertension -- Continue home amlodipine 10 mg p.o. daily, hydralazine  100 mg p.o. 3 times daily, metoprolol succinate 12.5 mg p.o. daily, -- Hold home clonidine, furosemide, spironolactone for now -- Hold home aspirin in setting of GI bleed -Continue monitor blood pressures per protocol; last blood pressure reading was a little elevated at 162/55   Anxiety: -Continue alprazolam as needed   Pulmonary artery hypertension -Continue tadalafil 40 mg p.o. nightly, prednisone 20 mg p.o. daily -Stopping IV fluid hydration -Continue to monitor respiratory status carefully   Hx C. difficile colitis C. difficile PCR on admission negative. --Continue vancomycin 125 mg p.o. twice daily while on antibiotics for acute diverticulitis as above for prophylaxis   Weakness/debility/deconditioning: -PT and OT recommending SNF versus home health PT depending on family decision and family wants to pursue a SNF   Social: -Patient currently lives alone, appears to have very poor social support with recurrent hospitalizations.  Patient is a DNR.  Overall prognosis is grim/poor -Palliative care consult and currently awaiting further recommendations from GI and palliative is encouraged completion of the healthcare power of attorney and ACP documentation; her CODE STATUS remains DNR -After further palliative care goals of discussion the plan is for SNF rehab with palliative care for continued follow-up afterwards   DVT prophylaxis: SCDs Start: 07/09/22 0058    Code Status: DNR Family Communication: Discussed with daughter at bedside  Disposition Plan:  Level of care: Progressive Status is: Inpatient Remains inpatient appropriate because: Seen and examined at bedside   Consultants:  Palliative Care Medicine Gastroenterology   Procedures:  EGD  Antimicrobials:  Anti-infectives (From admission, onward)    Start  Dose/Rate Route Frequency Ordered Stop   07/12/22 2200  [MAR Hold]  piperacillin-tazobactam (ZOSYN) IVPB 3.375 g        (MAR Hold since Thu 07/13/2022 at  1253.Hold Reason: Transfer to a Procedural area)   3.375 g 12.5 mL/hr over 240 Minutes Intravenous Every 12 hours 07/12/22 1114     07/09/22 1000  [MAR Hold]  vancomycin (VANCOCIN) capsule 125 mg        (MAR Hold since Thu 07/13/2022 at 1253.Hold Reason: Transfer to a Procedural area)   125 mg Oral 2 times daily 07/09/22 0814     07/09/22 0600  piperacillin-tazobactam (ZOSYN) IVPB 2.25 g        2.25 g 100 mL/hr over 30 Minutes Intravenous Every 8 hours 07/08/22 2356 07/12/22 1607   07/09/22 0000  vancomycin (VANCOCIN) capsule 125 mg  Status:  Discontinued        125 mg Oral 4 times daily 07/08/22 2348 07/09/22 0814   07/08/22 2045  piperacillin-tazobactam (ZOSYN) IVPB 3.375 g        3.375 g 100 mL/hr over 30 Minutes Intravenous  Once 07/08/22 2040 07/08/22 2348        Subjective: Seen and examined at bedside and she is having some back pain lying in the bed given her scoliosis and will also very anxious and nervous about her EGD.  States that she is having quite a bit of pain and was also hungry and wanting something to eat.  No nausea or vomiting.  No other concerns or points this time.  Objective: Vitals:   07/13/22 0800 07/13/22 1025 07/13/22 1200 07/13/22 1255  BP: (!) 155/46 (!) 155/54 (!) 153/51 (!) 162/55  Pulse: 78 88 85 90  Resp: '16 18 16 19  '$ Temp:  (!) 97.4 F (36.3 C) (!) 97.5 F (36.4 C) 97.7 F (36.5 C)  TempSrc:  Oral Oral Oral  SpO2: 100% 99% 97% 99%  Weight:      Height:        Intake/Output Summary (Last 24 hours) at 07/13/2022 1341 Last data filed at 07/12/2022 1836 Gross per 24 hour  Intake 969.39 ml  Output --  Net 969.39 ml   Filed Weights   07/11/22 0457 07/12/22 0500 07/13/22 0500  Weight: 63.4 kg 67 kg 67 kg   Examination: Physical Exam:  Constitutional: WN/WD overweight Caucasian elderly female who is quite anxious today Respiratory: Diminished to auscultation bilaterally, no wheezing, rales, rhonchi or crackles. Normal respiratory effort and  patient is not tachypenic. No accessory muscle use.  Unlabored breathing Cardiovascular: RRR, no murmurs / rubs / gallops. S1 and S2 auscultated.  1+ lower extremity edema bilaterally Abdomen: Soft, non-tender, distended secondary body habitus. Bowel sounds positive.  GU: Deferred. Musculoskeletal: No clubbing / cyanosis of digits/nails. No joint deformity upper and lower extremities.  Skin: No rashes, lesions, ulcers on limited skin evaluation. No induration; Warm and dry.  Neurologic: CN 2-12 grossly intact with no focal deficits. Romberg sign and cerebellar reflexes not assessed.  Psychiatric: Normal judgment and insight. Alert and oriented x 3. Normal mood and appropriate affect.   Data Reviewed: I have personally reviewed following labs and imaging studies  CBC: Recent Labs  Lab 07/07/22 1206 07/08/22 1932 07/09/22 0446 07/09/22 1524 07/10/22 0612 07/11/22 0000 07/11/22 0410 07/11/22 1339 07/12/22 0433 07/13/22 0434  WBC 7.1   < > 10.1  --  13.4*  --  9.5  --  7.4 6.6  NEUTROABS 6.5  --  8.0*  --   --   --   --   --   --  5.0  HGB 8.7*   < > 7.1*   < > 7.1* 9.4* 9.1* 8.6* 8.4* 7.9*  HCT 25.7*   < > 21.1*   < > 21.2* 27.6* 27.1* 25.7* 25.5* 24.5*  MCV 91.1   < > 89.8  --  91.8  --  92.8  --  95.5 96.5  PLT 136*   < > 149*  --  136*  --  104*  --  109* 104*   < > = values in this interval not displayed.   Basic Metabolic Panel: Recent Labs  Lab 07/09/22 0446 07/10/22 0612 07/11/22 0410 07/12/22 0433 07/13/22 0434  NA 135 133* 138 140 140  K 4.6 4.8 4.4 4.2 4.1  CL 107 105 110 109 113*  CO2 18* 17* 18* 19* 19*  GLUCOSE 321* 379* 229* 80 68*  BUN 139* 156* 141* 121* 97*  CREATININE 2.69* 3.41* 3.29* 2.95* 2.78*  CALCIUM 7.6* 7.7* 8.1* 8.3* 8.2*  MG 2.0  --  2.3  --  2.4  PHOS  --   --   --   --  5.0*   GFR: Estimated Creatinine Clearance: 12.8 mL/min (A) (by C-G formula based on SCr of 2.78 mg/dL (H)). Liver Function Tests: Recent Labs  Lab 07/07/22 1206  07/08/22 1932 07/09/22 0446 07/13/22 0434  AST '25 23 22 22  '$ ALT '15 16 16 14  '$ ALKPHOS 71 53 49 49  BILITOT 0.6 0.7 0.7 0.6  PROT 5.3* 4.3* 4.1* 4.4*  ALBUMIN 3.3* 2.5* 2.4* 2.6*   Recent Labs  Lab 07/08/22 1932  LIPASE 51   No results for input(s): "AMMONIA" in the last 168 hours. Coagulation Profile: Recent Labs  Lab 07/09/22 0446 07/10/22 0612 07/11/22 0410 07/12/22 0433 07/13/22 0434  INR 2.1* 1.5* 1.5* 1.5* 1.6*   Cardiac Enzymes: No results for input(s): "CKTOTAL", "CKMB", "CKMBINDEX", "TROPONINI" in the last 168 hours. BNP (last 3 results) No results for input(s): "PROBNP" in the last 8760 hours. HbA1C: No results for input(s): "HGBA1C" in the last 72 hours. CBG: Recent Labs  Lab 07/13/22 0407 07/13/22 0634 07/13/22 0712 07/13/22 0747 07/13/22 1031  GLUCAP 75 59* 164* 102* 108*   Lipid Profile: No results for input(s): "CHOL", "HDL", "LDLCALC", "TRIG", "CHOLHDL", "LDLDIRECT" in the last 72 hours. Thyroid Function Tests: No results for input(s): "TSH", "T4TOTAL", "FREET4", "T3FREE", "THYROIDAB" in the last 72 hours. Anemia Panel: No results for input(s): "VITAMINB12", "FOLATE", "FERRITIN", "TIBC", "IRON", "RETICCTPCT" in the last 72 hours. Sepsis Labs: Recent Labs  Lab 07/08/22 2215 07/10/22 0612 07/11/22 0410  LATICACIDVEN 2.7* 3.6* 0.8    Recent Results (from the past 240 hour(s))  C Difficile Quick Screen w PCR reflex     Status: None   Collection Time: 07/08/22 11:03 PM   Specimen: STOOL  Result Value Ref Range Status   C Diff antigen NEGATIVE NEGATIVE Final   C Diff toxin NEGATIVE NEGATIVE Final   C Diff interpretation No C. difficile detected.  Final    Comment: Performed at [facility], [place] [address], [place], [place] [address]    Radiology Studies: No results found.  Scheduled Meds:  [MAR Hold] sodium chloride   Intravenous Once   [MAR Hold] acetaminophen  1,000 mg Oral TID   [MAR Hold] ALPRAZolam  0.5 mg Oral  QHS   [MAR Hold] amLODipine  10 mg Oral Daily   [MAR Hold] hydrALAZINE  100 mg Oral TID   [MAR Hold] insulin aspart  0-20 Units Subcutaneous TID WC   [  MAR Hold] insulin aspart  3 Units Subcutaneous TID WC   [MAR Hold] insulin glargine-yfgn  20 Units Subcutaneous Daily   [MAR Hold] metoprolol succinate  12.5 mg Oral BID   [MAR Hold] pantoprazole  40 mg Intravenous Q12H   [MAR Hold] predniSONE  10 mg Oral Q breakfast   [MAR Hold] predniSONE  15 mg Oral Q breakfast   [MAR Hold] predniSONE  5 mg Oral Q breakfast   [MAR Hold] tadalafil  40 mg Oral QHS   [MAR Hold] vancomycin  125 mg Oral BID   Continuous Infusions:  sodium chloride     sodium chloride     pantoprazole 8 mg/hr (07/12/22 1258)   [MAR Hold] piperacillin-tazobactam (ZOSYN)  IV 3.375 g (07/13/22 1126)    LOS: 5 days   Raiford Noble, DO [facility] Available via Epic secure chat 7am-7pm After these hours, please refer to coverage provider listed on [email or web address] 07/13/2022, 1:41 PM

## 2022-07-13 NOTE — Interval H&P Note (Signed)
History and Physical Interval Note: 81/female with melena, warfarin on hold for 7 days, duodenal diverticulitis for an EGD with propofol.  07/13/2022 1:04 PM  [name]  has presented today for EGD, with the diagnosis of melena, anemia.  The various methods of treatment have been discussed with the patient and family. After consideration of risks, benefits and other options for treatment, the patient has consented to  Procedure(s): ESOPHAGOGASTRODUODENOSCOPY (EGD) (N/A) as a surgical intervention.  [name] history has been reviewed, patient examined, no change in status, stable for surgery.  I have reviewed the patient's chart and labs.  Questions were answered to the patient's satisfaction.     [name]

## 2022-07-13 NOTE — Progress Notes (Signed)
Occupational Therapy Treatment Patient Details Name: [name] MRN: [id number] DOB: 11/16/1940 Today's Date: 07/13/2022   History of present illness 81 y.o. 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 difficile colitis. Recent admission 9/14-9/17/23 for a UTI. Pt presented to [facility] ED on 9/30 via EMS with abdominal pain, diarrhea, dark stools. Dx of upper GIB, ABLA   OT comments  Patient reported having fatigue and increased back and L hip pain at the start of the session, as such she required some gentle encouragement and reinforcement to participate in the session. She presented with good sitting balance edge of bed, then required min assist to stand using a RW. She deferred attempts at progressive out of bed ADLs/activity, however she did take some lateral steps along the EOB using a RW. She was educated on improved positioning in bed to assist with pain, as well as gentle exercises for strengthening & to help with lower body edema management. She will continue to benefit from further OT services to facilitate progressive ADL performance and to decrease the risk for further deconditioning.    Recommendations for follow up therapy are one component of a multi-disciplinary discharge planning process, led by the attending physician.  Recommendations may be updated based on patient status, additional functional criteria and insurance authorization.    Follow Up Recommendations  Skilled nursing-short term rehab (<3 hours/day)    Assistance Recommended at Discharge Frequent or constant Supervision/Assistance     Equipment Recommendations  None recommended by OT    Recommendations for Other Services       Precautions / Restrictions Precautions Precautions: Fall Precaution Comments: contact precautions Restrictions Weight Bearing Restrictions: No       Mobility Bed Mobility Overal bed mobility: Needs Assistance Bed Mobility: Supine to Sit, Sit to Supine     Supine to sit: Supervision Sit to supine: Mod assist   General bed mobility comments: assist required for B LE for sit to supine; HOB elevated; required slightly increased time and effort    Transfers Overall transfer level: Needs assistance   Transfers: Sit to/from Stand Sit to Stand: Min assist, From elevated surface          Balance     Sitting balance-Leahy Scale: Good         Standing balance comment: min guard using RW for taking lateral steps along EOB                ADL either performed or assessed with clinical judgement   ADL   Eating/Feeding: Independent;Sitting   Grooming: Set up;Sitting Grooming Details (indicate cue type and reason): EOB         Upper Body Dressing : Set up;Sitting Upper Body Dressing Details (indicate cue type and reason): simulated Lower Body Dressing: Moderate assistance Lower Body Dressing Details (indicate cue type and reason): required assist to don/doff socks                     Cognition   Behavior During Therapy: [facility] for tasks assessed/performed Overall Cognitive Status: Within Functional Limits for tasks assessed                       Pertinent Vitals/ Pain       Pain Assessment Pain Assessment:  0-10 Pain Score: 8  Pain Location: back and L hip Pain Intervention(s): Ice applied, Repositioned, Limited activity within patient's tolerance, Patient requesting pain meds-RN notified         Frequency  Min 2X/week        Progress Toward Goals  OT Goals(current goals can now be found in the care plan section)     Acute Rehab OT Goals OT Goal Formulation: With patient Time For Goal Achievement: 07/25/22 Potential to Achieve  Goals: Good  Plan Discharge plan remains appropriate       AM-PAC OT "6 Clicks" Daily Activity     Outcome Measure   Help from another person eating meals?: None Help from another person taking care of personal grooming?: A Little Help from another person toileting, which includes using toliet, bedpan, or urinal?: A Little Help from another person bathing (including washing, rinsing, drying)?: A Lot Help from another person to put on and taking off regular upper body clothing?: A Little Help from another person to put on and taking off regular lower body clothing?: A Lot 6 Click Score: 17    End of Session Equipment Utilized During Treatment: Rolling walker (2 wheels)  OT Visit Diagnosis: Muscle weakness (generalized) (M62.81);Pain   Activity Tolerance Patient limited by pain   Patient Left in bed;with call bell/phone within reach;with bed alarm set;with family/visitor present   Nurse Communication  (Nurse cleared pt for therapy)        Time: 1638-4536 OT Time Calculation (min): 23 min  Charges: OT General Charges $OT Visit: 1 Visit OT Treatments $Therapeutic Activity: 23-37 mins    Leota Sauers, OTR/L 07/13/2022, 5:43 PM

## 2022-07-14 DIAGNOSIS — Z7401 Bed confinement status: Secondary | ICD-10-CM | POA: Diagnosis not present

## 2022-07-14 DIAGNOSIS — I0981 Rheumatic heart failure: Secondary | ICD-10-CM | POA: Diagnosis not present

## 2022-07-14 DIAGNOSIS — R918 Other nonspecific abnormal finding of lung field: Secondary | ICD-10-CM | POA: Diagnosis not present

## 2022-07-14 DIAGNOSIS — S199XXA Unspecified injury of neck, initial encounter: Secondary | ICD-10-CM | POA: Diagnosis not present

## 2022-07-14 DIAGNOSIS — I959 Hypotension, unspecified: Secondary | ICD-10-CM | POA: Diagnosis not present

## 2022-07-14 DIAGNOSIS — N178 Other acute kidney failure: Secondary | ICD-10-CM | POA: Diagnosis not present

## 2022-07-14 DIAGNOSIS — Z043 Encounter for examination and observation following other accident: Secondary | ICD-10-CM | POA: Diagnosis not present

## 2022-07-14 DIAGNOSIS — E8721 Acute metabolic acidosis: Secondary | ICD-10-CM | POA: Diagnosis not present

## 2022-07-14 DIAGNOSIS — E1122 Type 2 diabetes mellitus with diabetic chronic kidney disease: Secondary | ICD-10-CM | POA: Diagnosis not present

## 2022-07-14 DIAGNOSIS — Z794 Long term (current) use of insulin: Secondary | ICD-10-CM | POA: Diagnosis not present

## 2022-07-14 DIAGNOSIS — I5032 Chronic diastolic (congestive) heart failure: Secondary | ICD-10-CM | POA: Diagnosis not present

## 2022-07-14 DIAGNOSIS — F419 Anxiety disorder, unspecified: Secondary | ICD-10-CM | POA: Diagnosis not present

## 2022-07-14 DIAGNOSIS — Z5181 Encounter for therapeutic drug level monitoring: Secondary | ICD-10-CM | POA: Diagnosis not present

## 2022-07-14 DIAGNOSIS — R600 Localized edema: Secondary | ICD-10-CM | POA: Diagnosis not present

## 2022-07-14 DIAGNOSIS — K922 Gastrointestinal hemorrhage, unspecified: Secondary | ICD-10-CM | POA: Diagnosis not present

## 2022-07-14 DIAGNOSIS — I509 Heart failure, unspecified: Secondary | ICD-10-CM | POA: Diagnosis not present

## 2022-07-14 DIAGNOSIS — M1611 Unilateral primary osteoarthritis, right hip: Secondary | ICD-10-CM | POA: Diagnosis not present

## 2022-07-14 DIAGNOSIS — S41101A Unspecified open wound of right upper arm, initial encounter: Secondary | ICD-10-CM | POA: Diagnosis not present

## 2022-07-14 DIAGNOSIS — I10 Essential (primary) hypertension: Secondary | ICD-10-CM | POA: Diagnosis not present

## 2022-07-14 DIAGNOSIS — I779 Disorder of arteries and arterioles, unspecified: Secondary | ICD-10-CM | POA: Diagnosis not present

## 2022-07-14 DIAGNOSIS — R278 Other lack of coordination: Secondary | ICD-10-CM | POA: Diagnosis not present

## 2022-07-14 DIAGNOSIS — S51011A Laceration without foreign body of right elbow, initial encounter: Secondary | ICD-10-CM | POA: Diagnosis not present

## 2022-07-14 DIAGNOSIS — M25521 Pain in right elbow: Secondary | ICD-10-CM | POA: Diagnosis not present

## 2022-07-14 DIAGNOSIS — Z515 Encounter for palliative care: Secondary | ICD-10-CM | POA: Diagnosis not present

## 2022-07-14 DIAGNOSIS — R059 Cough, unspecified: Secondary | ICD-10-CM | POA: Diagnosis not present

## 2022-07-14 DIAGNOSIS — R41841 Cognitive communication deficit: Secondary | ICD-10-CM | POA: Diagnosis not present

## 2022-07-14 DIAGNOSIS — M8580 Other specified disorders of bone density and structure, unspecified site: Secondary | ICD-10-CM | POA: Diagnosis not present

## 2022-07-14 DIAGNOSIS — N184 Chronic kidney disease, stage 4 (severe): Secondary | ICD-10-CM | POA: Diagnosis not present

## 2022-07-14 DIAGNOSIS — S0990XA Unspecified injury of head, initial encounter: Secondary | ICD-10-CM | POA: Diagnosis not present

## 2022-07-14 DIAGNOSIS — J8410 Pulmonary fibrosis, unspecified: Secondary | ICD-10-CM | POA: Diagnosis not present

## 2022-07-14 DIAGNOSIS — N189 Chronic kidney disease, unspecified: Secondary | ICD-10-CM | POA: Diagnosis not present

## 2022-07-14 DIAGNOSIS — K5792 Diverticulitis of intestine, part unspecified, without perforation or abscess without bleeding: Secondary | ICD-10-CM | POA: Diagnosis not present

## 2022-07-14 DIAGNOSIS — I700 Atherosclerosis of aorta: Secondary | ICD-10-CM | POA: Diagnosis not present

## 2022-07-14 DIAGNOSIS — I480 Paroxysmal atrial fibrillation: Secondary | ICD-10-CM | POA: Diagnosis not present

## 2022-07-14 DIAGNOSIS — Z66 Do not resuscitate: Secondary | ICD-10-CM | POA: Diagnosis not present

## 2022-07-14 DIAGNOSIS — R062 Wheezing: Secondary | ICD-10-CM | POA: Diagnosis not present

## 2022-07-14 DIAGNOSIS — I4891 Unspecified atrial fibrillation: Secondary | ICD-10-CM | POA: Diagnosis not present

## 2022-07-14 DIAGNOSIS — R7402 Elevation of levels of lactic acid dehydrogenase (LDH): Secondary | ICD-10-CM | POA: Diagnosis not present

## 2022-07-14 DIAGNOSIS — S0101XA Laceration without foreign body of scalp, initial encounter: Secondary | ICD-10-CM | POA: Diagnosis not present

## 2022-07-14 DIAGNOSIS — D638 Anemia in other chronic diseases classified elsewhere: Secondary | ICD-10-CM | POA: Diagnosis not present

## 2022-07-14 DIAGNOSIS — D631 Anemia in chronic kidney disease: Secondary | ICD-10-CM | POA: Diagnosis not present

## 2022-07-14 DIAGNOSIS — K5740 Diverticulitis of both small and large intestine with perforation and abscess without bleeding: Secondary | ICD-10-CM | POA: Diagnosis not present

## 2022-07-14 DIAGNOSIS — D594 Other nonautoimmune hemolytic anemias: Secondary | ICD-10-CM | POA: Diagnosis not present

## 2022-07-14 DIAGNOSIS — N390 Urinary tract infection, site not specified: Secondary | ICD-10-CM | POA: Diagnosis not present

## 2022-07-14 DIAGNOSIS — I7782 Antineutrophilic cytoplasmic antibody (ANCA) vasculitis: Secondary | ICD-10-CM | POA: Diagnosis not present

## 2022-07-14 DIAGNOSIS — D649 Anemia, unspecified: Secondary | ICD-10-CM | POA: Diagnosis not present

## 2022-07-14 DIAGNOSIS — R197 Diarrhea, unspecified: Secondary | ICD-10-CM | POA: Diagnosis not present

## 2022-07-14 DIAGNOSIS — S0003XA Contusion of scalp, initial encounter: Secondary | ICD-10-CM | POA: Diagnosis not present

## 2022-07-14 DIAGNOSIS — Z7189 Other specified counseling: Secondary | ICD-10-CM | POA: Diagnosis not present

## 2022-07-14 DIAGNOSIS — R2689 Other abnormalities of gait and mobility: Secondary | ICD-10-CM | POA: Diagnosis not present

## 2022-07-14 DIAGNOSIS — R791 Abnormal coagulation profile: Secondary | ICD-10-CM | POA: Diagnosis not present

## 2022-07-14 DIAGNOSIS — E119 Type 2 diabetes mellitus without complications: Secondary | ICD-10-CM | POA: Diagnosis not present

## 2022-07-14 DIAGNOSIS — R531 Weakness: Secondary | ICD-10-CM | POA: Diagnosis not present

## 2022-07-14 DIAGNOSIS — M6281 Muscle weakness (generalized): Secondary | ICD-10-CM | POA: Diagnosis not present

## 2022-07-14 DIAGNOSIS — W050XXA Fall from non-moving wheelchair, initial encounter: Secondary | ICD-10-CM | POA: Diagnosis not present

## 2022-07-14 DIAGNOSIS — Z7901 Long term (current) use of anticoagulants: Secondary | ICD-10-CM | POA: Diagnosis not present

## 2022-07-14 DIAGNOSIS — D500 Iron deficiency anemia secondary to blood loss (chronic): Secondary | ICD-10-CM | POA: Diagnosis not present

## 2022-07-14 DIAGNOSIS — K2991 Gastroduodenitis, unspecified, with bleeding: Secondary | ICD-10-CM | POA: Diagnosis not present

## 2022-07-14 DIAGNOSIS — D591 Autoimmune hemolytic anemia, unspecified: Secondary | ICD-10-CM | POA: Diagnosis not present

## 2022-07-14 LAB — CBC WITH DIFFERENTIAL/PLATELET
Abs Immature Granulocytes: 0.02 10*3/uL (ref 0.00–0.07)
Basophils Absolute: 0 10*3/uL (ref 0.0–0.1)
Basophils Relative: 0 %
Eosinophils Absolute: 0.1 10*3/uL (ref 0.0–0.5)
Eosinophils Relative: 1 %
HCT: 25 % — ABNORMAL LOW (ref 36.0–46.0)
Hemoglobin: 7.9 g/dL — ABNORMAL LOW (ref 12.0–15.0)
Immature Granulocytes: 0 %
Lymphocytes Relative: 12 %
Lymphs Abs: 0.6 10*3/uL — ABNORMAL LOW (ref 0.7–4.0)
MCH: 31.1 pg (ref 26.0–34.0)
MCHC: 31.6 g/dL (ref 30.0–36.0)
MCV: 98.4 fL (ref 80.0–100.0)
Monocytes Absolute: 0.4 10*3/uL (ref 0.1–1.0)
Monocytes Relative: 8 %
Neutro Abs: 3.7 10*3/uL (ref 1.7–7.7)
Neutrophils Relative %: 79 %
Platelets: 99 10*3/uL — ABNORMAL LOW (ref 150–400)
RBC: 2.54 MIL/uL — ABNORMAL LOW (ref 3.87–5.11)
RDW: 18.5 % — ABNORMAL HIGH (ref 11.5–15.5)
WBC: 4.7 10*3/uL (ref 4.0–10.5)
nRBC: 0.6 % — ABNORMAL HIGH (ref 0.0–0.2)

## 2022-07-14 LAB — COMPREHENSIVE METABOLIC PANEL
ALT: 14 U/L (ref 0–44)
AST: 23 U/L (ref 15–41)
Albumin: 2.4 g/dL — ABNORMAL LOW (ref 3.5–5.0)
Alkaline Phosphatase: 53 U/L (ref 38–126)
Anion gap: 8 (ref 5–15)
BUN: 84 mg/dL — ABNORMAL HIGH (ref 8–23)
CO2: 20 mmol/L — ABNORMAL LOW (ref 22–32)
Calcium: 8.1 mg/dL — ABNORMAL LOW (ref 8.9–10.3)
Chloride: 113 mmol/L — ABNORMAL HIGH (ref 98–111)
Creatinine, Ser: 2.44 mg/dL — ABNORMAL HIGH (ref 0.44–1.00)
GFR, Estimated: 19 mL/min — ABNORMAL LOW (ref >60)
Glucose, Bld: 79 mg/dL (ref 70–99)
Potassium: 4.6 mmol/L (ref 3.5–5.1)
Sodium: 141 mmol/L (ref 135–145)
Total Bilirubin: 0.7 mg/dL (ref 0.3–1.2)
Total Protein: 4.2 g/dL — ABNORMAL LOW (ref 6.5–8.1)

## 2022-07-14 LAB — PROTIME-INR
INR: 1.6 — ABNORMAL HIGH (ref 0.8–1.2)
Prothrombin Time: 18.5 seconds — ABNORMAL HIGH (ref 11.4–15.2)

## 2022-07-14 LAB — GLUCOSE, CAPILLARY
Glucose-Capillary: 103 mg/dL — ABNORMAL HIGH (ref 70–99)
Glucose-Capillary: 122 mg/dL — ABNORMAL HIGH (ref 70–99)
Glucose-Capillary: 202 mg/dL — ABNORMAL HIGH (ref 70–99)

## 2022-07-14 LAB — PHOSPHORUS: Phosphorus: 5.4 mg/dL — ABNORMAL HIGH (ref 2.5–4.6)

## 2022-07-14 LAB — MAGNESIUM: Magnesium: 2.3 mg/dL (ref 1.7–2.4)

## 2022-07-14 MED ORDER — ALPRAZOLAM 0.5 MG PO TABS: 0.5000 mg | ORAL_TABLET | Freq: Every day | ORAL | 0 refills | Status: DC

## 2022-07-14 MED ORDER — INSULIN GLARGINE-YFGN 100 UNIT/ML ~~LOC~~ SOLN: 10.0000 [IU] | Freq: Every day | SUBCUTANEOUS | 11 refills | Status: DC

## 2022-07-14 MED ORDER — VANCOMYCIN HCL 125 MG PO CAPS: 125.0000 mg | ORAL_CAPSULE | Freq: Two times a day (BID) | ORAL | 0 refills | Status: AC

## 2022-07-14 MED ORDER — ONDANSETRON HCL 4 MG PO TABS: 4.0000 mg | ORAL_TABLET | Freq: Four times a day (QID) | ORAL | 0 refills | Status: DC | PRN

## 2022-07-14 MED ORDER — AMOXICILLIN-POT CLAVULANATE 875-125 MG PO TABS: 1.0000 | ORAL_TABLET | Freq: Two times a day (BID) | ORAL | 0 refills | Status: AC

## 2022-07-14 MED ORDER — HYDROMORPHONE HCL 1 MG/ML PO LIQD: 1.0000 mg | Freq: Four times a day (QID) | ORAL | 0 refills | Status: DC | PRN

## 2022-07-14 MED ORDER — WARFARIN SODIUM 5 MG PO TABS: 5.0000 mg | ORAL_TABLET | ORAL | Status: DC

## 2022-07-14 MED ORDER — FLUCONAZOLE 100 MG PO TABS: 100.0000 mg | ORAL_TABLET | Freq: Every day | ORAL | 0 refills | Status: DC

## 2022-07-14 MED ORDER — PREDNISONE 5 MG PO TABS: ORAL_TABLET | ORAL | 0 refills | Status: AC

## 2022-07-14 NOTE — Consult Note (Signed)
   [facility] Inpatient Consult   07/14/2022  CORIANNE BUCCELLATO [birth year] [id number]  [facility] [ACO] Patient: Josephine Igo HMO  Primary Care Provider:  [name], MD,  [facility] Liaison remote coverage at [facility]  *Less than 30 days readmission review, with extreme high risk scores for unplanned readmission risk  Patient is currently active with [place] [facility] for care coordination services.  Patient has been engaged by a [place].  Our community based plan of care has focused on disease management and community resource support.    Plan:  If patient transitions to a skilled nursing facility for short term rehab her post hospital transitional needs are to be met for short term rehab, awaiting insurance authorization. Will alert [facility] RNCM of current disposition for rehab SNF, when disposition is finalized.  Of note, [facility] Care Management services does not replace or interfere with any services that are needed or arranged by inpatient [facility] care management team.  For additional questions or referrals please contact:  Natividad Brood, RN BSN [place]  [phone] business mobile phone Toll free office [phone]  *[place]  [phone] Fax number: [phone] [email or web address]

## 2022-07-14 NOTE — TOC Progression Note (Addendum)
Transition of Care [facility]) - Progression Note    Patient Details  Name: MARQUASHA BRUTUS MRN: [id number] Date of Birth: 1941/03/05  Transition of Care [facility]) CM/SW [place], RN Phone Number: 07/14/2022, 9:35 AM  Clinical Narrative:  Patient's family chose [facility] for SNF placement. This RNCM notified Sharyn Lull with [facility] who has a bed available today. Awaiting insurance auth  TOC will continue to follow   -10:07am insurance Donell Beers [id number] 10/6-10/07/2022    Expected Discharge Plan: Skilled Nursing Facility Barriers to Discharge: Continued Medical Work up  Expected Discharge Plan and Services Expected Discharge Plan: [place] In-house Referral: NA Discharge Planning Services: CM Consult Post Acute Care Choice: [place] Living arrangements for the past 2 months: Single Family Home                 DME Arranged: N/A DME Agency: NA       HH Arranged: NA HH Agency: NA         Social Determinants of Health (SDOH) Interventions    Readmission Risk Interventions    04/20/2022   12:02 PM  Readmission Risk Prevention Plan  Transportation Screening Complete  Medication Review (RN Care Manager) Complete  PCP or Specialist appointment within 3-5 days of discharge Complete  HRI or [place] Complete  SW Recovery Care/Counseling Consult Complete  [place] Not Applicable

## 2022-07-14 NOTE — Progress Notes (Signed)
Called report to [facility] LPN for transfer to [facility]. Most recent VS, & all other pertinent information provided to nurse who will receive pt. Report also given to EMS, who transported pt. Prior to leaving, pt demonstrated no s/s of distress.

## 2022-07-14 NOTE — Discharge Summary (Signed)
Physician Discharge Summary   Patient: [name] MRN: [id number] DOB: [birth year]  Admit date:     07/08/2022  Discharge date: 07/14/22  Discharge Physician: Raiford Noble, DO   PCP: [name], MD   Recommendations at discharge:   Follow up with PCP within 1-2 weeks and repeat CBC, CMP, Mag, Phos within 1 week  Follow-up with gastroenterology in outpatient setting within 1 to 2 weeks Follow-up with palliative care medicine in the outpatient setting Follow-up with hematology medical oncology in outpatient setting within 1 to 2 weeks  Discharge Diagnoses: Principal Problem:   GI bleeding Active Problems:   Supratherapeutic INR   Diverticulitis   Acute on chronic anemia   Chronic anticoagulation   Chronic diastolic CHF (congestive heart failure) (HCC)   Diabetes mellitus type 2, controlled ([facility])   H/O mechanical aortic valve replacement   PAF (paroxysmal atrial fibrillation) (HCC)   Vasculitis, ANCA positive   CKD (chronic kidney disease), stage IV (HCC)   Autoimmune hemolytic anemia (HCC)   Bilateral lower extremity edema   History of Clostridioides difficile colitis   DNR (do not resuscitate)/DNI(Do Not Intubate)   GI bleed   Palliative care encounter   Goals of care, counseling/discussion   Counseling and coordination of care  Resolved Problems:   * No resolved hospital problems. *  Hospital Course: [name] is a 81 y.o. 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 difficile colitis who presented to [facility] ED on 9/30 via EMS with abdominal pain, diarrhea, dark stools.  Patient was seen by her oncologist day prior to admission who follows her for her hemolytic anemia.  She has  been unable to wean off of prednisone due to worsening anemia.  Following return from her doctor's appointment, patient reports a sudden urge to defecate with notable melanotic stools around 6 PM.  She reports consistent melanotic stools all night associated with weakness/fatigue.  Also with some generalized abdominal pain but denies fever.  Given persistence of symptoms, patient called EMS and was brought to the ED for further evaluation.   In the ED, temperature 97.6, HR 89, RR 16, BP 133/50, SPO2 100% on room air.  WBC 6.5, hemoglobin 5.6, platelets 148.  Sodium 132, potassium 5.2, chloride 102, CO2 19, glucose 354, BUN 138, creatinine 2.77.  Lipase 51.  AST 23, ALT 16, total bilirubin 0.7.  Lactic acid 2.7.  INR 5.4.  FOBT positive.  C. difficile negative.  CT abdomen/pelvis with marked duodenitis/duodenal diverticulitis no evidence of perforation with questionable mild diverticulitis ascending colon and cecum.  EDP ordered vitamin K for INR reversal and 2 unit PRBCs.  TRH consulted for admission and further evaluation and management of acute GI bleed in setting of supratherapeutic INR.  Currently going further work-up and is undergoing EGD today    Assessment and Plan: Acute blood loss/symptomatic anemia Acute upper GI bleed Hx iron deficiency/anemia of chronic medical/renal disease -Patient presenting to ED with abdominal discomfort, fatigue associated with recurrent melanotic stools.   -Hemoglobin 5.6 on admission (recently 8.7 on 9/29 and 9.6 on 9/21).   -Additionally BUN elevated at 139 (was 84 on 9/17) and FOBT positive.   -Additionally INR elevated at 5.4 which is likely contributing factor.   -Etiology likely secondary to upper GI bleed.  Received  IV vitamin K per EDP. [facility] following and plan is for EGD and EGD done and showed " Esophageal plaques were found, consistent with                            candidiasis.                           - Z-line regular, 36 cm from the incisors.                            - Erosive gastropathy with no bleeding and no                            stigmata of recent bleeding.                           - Non-bleeding duodenal diverticulum.                           - Mucosal changes in the duodenum.                           - No specimens collected." -Transfused 5 units total as she was given 2 units pRBC on 9/30, 1 unit 10/1, 2u 10/2 --Hgb 5.4>7.1>8.7>8.7>7.1>9.4>9.1 and now Hgb/Hct hast gone to 8.6/25.7 -> 8.4/25.5 and is now slightly on the lower side at 7.9/24.5 yesterday and today is stable at 7.9/25.0 -- INR 5.4>2.1>1.5>1.5 x2 -> and now INR is 1.6 x2 --Continuting  Protonix drip -Per GI resuming Coumadin in 3 days -- Given cardiac history, goal Hgb >/= 8.0 --Continue to Monitor on telemetry, supportive care; she continues to have some black stools but hemoglobin is stable and GI is cleared the patient for discharge   Acute Diverticulitis Lactic acidosis CT abdomen/pelvis with marked duodenitis/duodenal diverticulitis, no evidence of perforation with questionable diverticulitis ascending colon/cecum.  Patient was complaining of some abdominal discomfort. --C/w IV Zosyn for duodenitis/duodenal diverticulitis and possible right-sided colonic diverticulitis and changed to Augmentin at discharge --Oral vancomycin twice daily while on antibiotics for C. difficile colitis prophylaxis -- Diet advanced and she is now on a carb modified diet. -Repeat CBC within 1 week   Paroxysmal atrial fibrillation Hx mechanical aortic valve replacement on Coumadin Supratherapeutic INR -INR notably elevated at 5.4 on admission, goal 2.0-3.0.  In the setting of active bleeding as above.  Now PT/INR is 18.5/1.6 --Continue to hold Coumadin/aspirin and will resume hopefully soon pending GI work-up and evaluation -Continue with INR and frequent checks and repeat PT/INR at skilled nursing facility within 3 days   Autoimmune hemolytic anemia ANCA  vasculitis -Follows with hematology, Dr. Chryl Heck outpatient.   -Maintained on prednisone 20 mg p.o. daily but this is weaning now.  Continue outpatient follow-up. --Continue Prednisone taper per oncology   Acute renal failure on CKD stage IV Metabolic acidosis -Baseline creatinine 2.2-2.4. -Creatinine on admission 2.77; etiology likely secondary to dehydration versus acute blood loss anemia with ATN given GI bleed as above.   -Refused sodium bicarb in the past due to "side effects". --Cr 2.77>2.69>3.41>3.29 and albumin/creatinine is 121/2.95 yesterday and today is now 97/2.78 yesterday and today is now improved to 84/2.44 --s/p 5u pRBC as above -Patient's CO2  is now 20, anion gap is 8, chloride level is 113 --NS at 75 mL/hr now stopped given that her lower extremities are swollen and can resume her Lasix at discharge -Avoid further nephrotoxic medications, contrast dyes, hypotension and dehydration to ensure adequate renal perfusion monitoring adjust medications -Continue monitor and trend renal function carefully and repeat CMP within 1 week   Type 2 diabetes mellitus -Home regimen includes Tresiba 9-14 units subcutaneously daily, NovoLog insulin sliding scale.  Hemoglobin A1c 6.3 on 04/27/2022, well controlled. -SSI for coverage and this was reduced to 0 to 9 units subcu 3 times daily with meals given that she was hypoglycemic this morning -Reduced her long-acting Semglee from 20 units nightly to 10 units -Cbgs QAC/HS -CBGs ranging from 98-201   Chronic diastolic congestive heart failure -Home regimen includes metoprolol succinate 12.5 mg p.o. daily, furosemide 40 mg p.o. twice daily, spironolactone 25 mg p.o. daily. -- Continue metoprolol succinate 12.5 mg p.o. BID -- Hold home spironolactone and furosemide for now -We will stop IV fluid hydration given that her legs are getting more swollen and because she is + 6.496 L -- Strict I's and O's and daily weights -Continue to Monitor and  trend and repeat CMP within 1 week   Essential hypertension -- Continue home amlodipine 10 mg p.o. daily, hydralazine 100 mg p.o. 3 times daily, metoprolol succinate 12.5 mg p.o. daily, -- Hold home clonidine, furosemide, spironolactone for now and resume at discharge -- Hold home aspirin in setting of GI bleed and resume within a week -Continue monitor blood pressures per protocol; last blood pressure reading was a little elevated at 162/55   Anxiety: -Continue alprazolam as needed   Pulmonary artery hypertension -Continue tadalafil 40 mg p.o. nightly, prednisone 20 mg p.o. daily started to be tapered by medical oncology -Stopping IV fluid hydration -Continue to monitor respiratory status carefully   Hx C. difficile colitis C. difficile PCR on admission negative. --Continue vancomycin 125 mg p.o. twice daily while on antibiotics for acute diverticulitis as above for prophylaxis and will continue another week or so after she completes the antibiotics   Weakness/debility/deconditioning: -PT and OT recommending SNF versus home health PT depending on family decision and family wants to pursue a SNF   Social: -Patient currently lives alone, appears to have very poor social support with recurrent hospitalizations.  Patient is a DNR.  Overall prognosis is grim/poor -Palliative care consult and currently awaiting further recommendations from GI and palliative is encouraged completion of the healthcare power of attorney and ACP documentation; her CODE STATUS remains DNR -After further palliative care goals of discussion the plan is for SNF rehab with palliative care for continued follow-up afterwards  Obesity -Complicates overall prognosis and care -Estimated body mass index is 30.01 kg/m as calculated from the following:   Height as of this encounter: '4\' 11"'$  (1.499 m).   Weight as of this encounter: 67.4 kg.  -Weight Loss and Dietary Counseling given  Active Pressure Injury/Wound(s)      Pressure Ulcer  Duration          Pressure Injury 06/22/22 Sacrum Medial;Bilateral Stage 1 -  Intact skin with non-blanchable redness of a localized area usually over a bony prominence. 21 days           Consultants: Gastroenterology, palliative care medicine Procedures performed: As above and had an EGD Disposition: Skilled nursing facility Diet recommendation:  Discharge Diet Orders (From admission, onward)     Start     Ordered  07/14/22 0000  Diet - low sodium heart healthy        07/14/22 1248           Cardiac and Carb modified diet DISCHARGE MEDICATION: Allergies as of 07/14/2022       Reactions   Flagyl [metronidazole] Rash, Other (See Comments)   ALL-OVER BODY RASH   Augmentin [amoxicillin-pot Clavulanate] Diarrhea, Nausea And Vomiting   Ciprofloxacin Diarrhea, Nausea Only, Other (See Comments)   Stomach ache   Coreg [carvedilol] Other (See Comments)   Terrible cramping in the feet and had a lot of bowel movements, but not diarrhea   Diflucan [fluconazole] Diarrhea   Losartan Swelling, Other (See Comments)   Patient doesn't recall site of swelling   Privigen [immune Globulin (human)] Other (See Comments)   Severe/excruciating pain   Sulfamethoxazole-trimethoprim Diarrhea, Nausea Only, Other (See Comments)   Stomach upset   Verapamil Hives   Zetia [ezetimibe] Other (See Comments)   Reaction not recalled   Zocor [simvastatin - High Dose] Other (See Comments)   Reaction not recalled   Gatifloxacin Rash, Other (See Comments)   Redness to skin around eye        Medication List     STOP taking these medications    aspirin EC 81 MG tablet   ondansetron 4 MG disintegrating tablet Commonly known as: ZOFRAN-ODT   sodium bicarbonate 650 MG tablet   Tresiba FlexTouch 100 UNIT/ML FlexTouch Pen Generic drug: insulin degludec       TAKE these medications    acetaminophen 325 MG tablet Commonly known as: TYLENOL Take 325 mg by mouth as needed  for mild pain or fever.   alendronate 70 MG tablet Commonly known as: FOSAMAX Take 70 mg by mouth once a week.   allopurinol 100 MG tablet Commonly known as: ZYLOPRIM Take 100 mg by mouth daily.   ALPRAZolam 0.5 MG tablet Commonly known as: XANAX Take 1 tablet (0.5 mg total) by mouth at bedtime.   amLODipine 10 MG tablet Commonly known as: NORVASC TAKE 1 TABLET EVERY DAY   amoxicillin-clavulanate 875-125 MG tablet Commonly known as: AUGMENTIN Take 1 tablet by mouth 2 (two) times daily for 11 doses.   ascorbic acid 500 MG tablet Commonly known as: VITAMIN C Take 500 mg by mouth daily with lunch.   atorvastatin 80 MG tablet Commonly known as: LIPITOR Take 1 tablet (80 mg total) by mouth daily at 6 PM. What changed: when to take this   B-D INS SYR ULTRAFINE .3CC/29G 29G X 1/2" 0.3 ML Misc Generic drug: Insulin Syringe-Needle U-100 Use for sliding scale for breakthrough high blood sugar readings   cholestyramine 4 GM/DOSE powder Commonly known as: QUESTRAN Take 4 g by mouth daily after supper.   cloNIDine 0.2 MG tablet Commonly known as: CATAPRES TAKE 1 TABLET TWICE DAILY   estradiol 0.1 MG/GM vaginal cream Commonly known as: ESTRACE Place 0.5g nightly for two weeks then twice a week after What changed:  how much to take how to take this additional instructions   famotidine 20 MG tablet Commonly known as: PEPCID Take 1 tablet (20 mg total) by mouth 2 (two) times daily.   Florastor 250 MG capsule Generic drug: saccharomyces boulardii Take 250 mg by mouth 2 (two) times daily.   fluconazole 100 MG tablet Commonly known as: DIFLUCAN Take 1 tablet (100 mg total) by mouth daily. Start taking on: July 15, 625   folic acid 1 MG tablet Commonly known as: FOLVITE Take 1 mg by  mouth daily.   furosemide 40 MG tablet Commonly known as: LASIX TAKE 2 TABLETS (80 MG TOTAL) IN MORNING AND 1 AND 1/2 TABLETS (60 MG TOTAL) IN EVENING. CHANGE IN DOSAGE. What changed:  See the new instructions.   hydrALAZINE 100 MG tablet Commonly known as: APRESOLINE TAKE 1 TABLET THREE TIMES DAILY   HYDROmorphone HCl 1 MG/ML Liqd Commonly known as: DILAUDID Take 1-2 mLs (1-2 mg total) by mouth every 6 (six) hours as needed for severe pain or moderate pain (use oral medication prior to giving IV).   insulin aspart 100 UNIT/ML injection Commonly known as: novoLOG Take 2 units for blood sugar 200-250, 4 units for blood sugar 252-300, 6 units for blood sugar 301-350, 8 units for blood sugar greater than 350 and call MD. What changed:  how much to take how to take this when to take this additional instructions   insulin glargine-yfgn 100 UNIT/ML injection Commonly known as: SEMGLEE Inject 0.1 mLs (10 Units total) into the skin daily. Start taking on: July 15, 2022   lidocaine 2 % solution Commonly known as: XYLOCAINE Use as directed 15 mLs in the mouth or throat as needed for mouth pain. What changed:  how much to take when to take this   metoprolol succinate 25 MG 24 hr tablet Commonly known as: TOPROL-XL TAKE 1/2 TABLET EVERY DAY What changed: when to take this   mirabegron ER 25 MG Tb24 tablet Commonly known as: Myrbetriq Take 1 tablet (25 mg total) by mouth daily.   nystatin-alum & mag hydroxide-simeth-diphenhydrAMINE Swish 5 mLs by mouth 3 times daily then spit   ondansetron 4 MG tablet Commonly known as: ZOFRAN Take 1 tablet (4 mg total) by mouth every 6 (six) hours as needed for nausea.   pantoprazole 40 MG tablet Commonly known as: PROTONIX Take 40 mg by mouth 2 (two) times daily.   predniSONE 5 MG tablet Commonly known as: DELTASONE Take 3 tablets (15 mg total) by mouth daily with breakfast for 2 days, THEN 2 tablets (10 mg total) daily with breakfast for 5 days, THEN 1 tablet (5 mg total) daily with breakfast for 5 days. Start taking on: July 15, 2022 What changed:  medication strength See the new instructions.   simethicone 80  MG chewable tablet Commonly known as: Gas-X Chew 1 tablet (80 mg total) by mouth 4 (four) times daily as needed for flatulence. What changed: when to take this   spironolactone 25 MG tablet Commonly known as: ALDACTONE TAKE 1 TABLET EVERY DAY   tadalafil (PAH) 20 MG tablet Commonly known as: ADCIRCA Take 2 tablets (40 mg total) by mouth daily. What changed: when to take this   vancomycin 125 MG capsule Commonly known as: VANCOCIN Take 1 capsule (125 mg total) by mouth 2 (two) times daily for 14 days.   Vitamin B12 1000 MCG Tbcr Take 1,000 mcg by mouth daily with lunch.   Vitamin D3 50 MCG (2000 UT) capsule Take 2,000 Units by mouth daily with lunch.   warfarin 5 MG tablet Commonly known as: COUMADIN Take as directed. If you are unsure how to take this medication, talk to your nurse or doctor. Original instructions: Take 1-1.5 tablets (5-7.5 mg total) by mouth See admin instructions. Take 5 mg once daily on Monday, Tuesday, Wednesday, Thursday, and Saturday. Take 7.5 mg by mouth on Friday and Sunday. Start taking on: July 17, 2022 What changed:  how much to take how to take this when to take this additional  instructions These instructions start on July 17, 2022. If you are unsure what to do until then, ask your doctor or other care provider.               Discharge Care Instructions  (From admission, onward)           Start     Ordered   07/14/22 0000  Discharge wound care:       Comments: Frequent Turning   07/14/22 1248            Contact information for after-discharge care     Destination     [facility]erred [facility] .   Service: Skilled Nursing Contact information: [address] [place] [phone]                    Discharge Exam: Filed Weights   07/12/22 0500 07/13/22 0500 07/14/22 0441  Weight: 67 kg 67 kg 67.4 kg   Vitals:   07/14/22 0440 07/14/22 1248  BP: (!) 140/53 (!)  153/47  Pulse: 88 81  Resp: 16 (!) 22  Temp: 97.9 F (36.6 C) 97.9 F (36.6 C)  SpO2: 99% 99%   Examination: Physical Exam:  Constitutional: Well-nourished, well-developed obese elderly Caucasian female who is more comfortable today than yesterday Respiratory: Diminished to auscultation bilaterally, no wheezing, rales, rhonchi or crackles. Normal respiratory effort and patient is not tachypenic. No accessory muscle use.  Unlabored breathing Cardiovascular: RRR, no murmurs / rubs / gallops. S1 and S2 auscultated.  Has 1-2+ lower extremity edema Abdomen: Soft, mildly-tender, distended secondary body habitus.. Bowel sounds positive.  GU: Deferred. Musculoskeletal: No clubbing / cyanosis of digits/nails. No joint deformity upper and lower extremities.  Skin: No rashes, lesions, ulcers on limited skin evaluation. No induration; Warm and dry.  Neurologic: CN 2-12 grossly intact with no focal deficits.  Romberg sign and cerebellar reflexes not assessed.  Psychiatric: Normal judgment and insight. Alert and oriented x 3. Normal mood and appropriate affect.   Condition at discharge: stable  The results of significant diagnostics from this hospitalization (including imaging, microbiology, ancillary and laboratory) are listed below for reference.   Imaging Studies: CT ABDOMEN PELVIS WO CONTRAST  Result Date: 07/08/2022 CLINICAL DATA:  Abdominal pain, diarrhea, black stools EXAM: CT ABDOMEN AND PELVIS WITHOUT CONTRAST TECHNIQUE: Multidetector CT imaging of the abdomen and pelvis was performed following the standard protocol without IV contrast. RADIATION DOSE REDUCTION: This exam was performed according to the departmental dose-optimization program which includes automated exposure control, adjustment of the mA and/or kV according to patient size and/or use of iterative reconstruction technique. COMPARISON:  CT abdomen and pelvis 06/22/2022 FINDINGS: Lower chest: No acute abnormality. Hepatobiliary:  No focal liver abnormality is seen. Cholecystectomy. No biliary dilation. Pancreas: Small amount of stranding about the head of the pancreas is favored due to duodenitis/duodenal diverticulitis as the majority of the fluid is centered around the duodenum. Spleen: Normal in size without focal abnormality. Adrenals/Urinary Tract: Adrenal glands are unremarkable. Kidneys are normal, without renal calculi, focal lesion, or hydronephrosis. Bladder is unremarkable. Stomach/Bowel: Duodenal diverticulum. Marked wall thickening about the second and third portions of the duodenum with adjacent hazy mesenteric fat stranding. Free fluid about the duodenum tracks inferiorly in the right anterior pararenal fascia. No free intraperitoneal air. Marked colonic diverticulosis. There is mild wall thickening about the ascending colon and cecum. Vascular/Lymphatic: Aortic atherosclerosis. No enlarged abdominal or pelvic lymph nodes. Reproductive: Status post hysterectomy. No adnexal masses.  Other: Increased nonspecific subcutaneous stranding in the lower anterior abdominal subcutaneous fat. Subcutaneous gas in the anterior abdominal wall favored due to injections. Edema within the lateral thighs bilaterally. Musculoskeletal: Advanced degenerative arthritis right hip. Unchanged superior endplate compression of L4. No acute osseous abnormality. IMPRESSION: Marked duodenitis/duodenal diverticulitis. No evidence of perforation. Question additional mild diverticulitis of the ascending colon and cecum. Nonspecific hazy subcutaneous fat stranding within the anterior abdominal wall and bilateral thighs. Small amount of subcutaneous gas in the right anterior abdomen favored to be due to injections. Clinical correlation recommended. Aortic Atherosclerosis (ICD10-I70.0). Electronically Signed   By: Placido Sou M.D.   On: 07/08/2022 20:30   DG Abd 2 Views  Result Date: 06/29/2022 CLINICAL DATA:  Swollen upper abdomen. Recent  hospitalization. Evaluate for obstruction or ileus. EXAM: ABDOMEN - 2 VIEW COMPARISON:  KUB 06/06/2022, CT abdomen pelvis 06/22/2022 FINDINGS: Moderate stool is seen throughout the ascending and proximal transverse colon. Scattered air overlying the sigmoid colon likely corresponds to air within the numerous diverticula seen within the sigmoid colon on prior CT. Mild stool within the descending colon. Air is seen within nondistended loops of small and large bowel. No portal venous gas or pneumatosis is seen. No definite pneumoperitoneum. Dense splenic arterial calcifications. Left common iliac artery stent is again seen. Right upper quadrant surgical clips. Mild levocurvature centered at the thoracolumbar junction with moderate multilevel degenerative disc changes. Severe right femoroacetabular joint space narrowing with bone-on-bone contact. IMPRESSION: Nonobstructed bowel-gas pattern.  Moderate stool within the colon. Note is made of sigmoid diverticulitis on recent 06/22/2022 CT. Electronically Signed   By: Yvonne Kendall M.D.   On: 06/29/2022 13:46   CT ABDOMEN PELVIS WO CONTRAST  Result Date: 06/22/2022 CLINICAL DATA:  Left lower quadrant abdominal pain and nausea. Sepsis. EXAM: CT ABDOMEN AND PELVIS WITHOUT CONTRAST TECHNIQUE: Multidetector CT imaging of the abdomen and pelvis was performed following the standard protocol without IV contrast. RADIATION DOSE REDUCTION: This exam was performed according to the departmental dose-optimization program which includes automated exposure control, adjustment of the mA and/or kV according to patient size and/or use of iterative reconstruction technique. COMPARISON:  CT abdomen and pelvis 04/18/2022 FINDINGS: Lower chest: New trace right pleural effusion. Atelectasis in both lower lobes. Large calcified granuloma in the left lower lobe. Coronary atherosclerosis. Hepatobiliary: Scattered calcified granulomas in the liver. Cholecystectomy. No biliary dilatation.  Pancreas: Unremarkable. Spleen: Calcified granuloma. Adrenals/Urinary Tract: Unremarkable adrenal glands. Renal vascular calcifications. No evidence of a renal mass, hydronephrosis, or definite urinary tract calculi. Extensive gas and debris in the bladder with some of the gas appearing to be within the bladder wall. Stomach/Bowel: The stomach is unremarkable. A duodenal diverticulum is again noted. There is no evidence of bowel obstruction. Diverticulosis is again noted of the sigmoid colon with persistent wall thickening and surrounding inflammatory changes which are greatest near the proximal sigmoid colon. The inflammation more localized to the proximal sigmoid colon is stable to mildly increased compared to the prior study, however the degree of overall inflammation throughout the pelvis is less than on the prior CT. Vascular/Lymphatic: Extensive abdominal aortic atherosclerosis without aneurysm. No enlarged lymph nodes. Reproductive: Status post hysterectomy.  No gross adnexal mass. Other: Minimal pelvic free fluid. No organized fluid collection or pneumoperitoneum. Nonspecific subcutaneous stranding/edema in the lower abdominal wall and proximal thighs, right greater than left. Musculoskeletal: No suspicious osseous lesion. Asymmetrically advanced right hip osteoarthrosis. Chronic L4 compression fracture. IMPRESSION: 1. 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 A fistula between the bladder and either bowel or vagina is not excluded given this appearance, however a discrete fistula is not readily apparent on today's CT, and a colovesical fistula was not shown on a pelvic CT utilizing rectal contrast in July. 2. Persistent/recurrent sigmoid diverticulitis. No evidence of abscess. 3. New trace right pleural effusion. 4. Aortic Atherosclerosis (ICD10-I70.0) and Emphysema (ICD10-J43.9). Electronically Signed   By: Logan Bores M.D.   On: 06/22/2022 11:32   DG Chest Port 1 View  Result Date: 06/22/2022 CLINICAL DATA:  Sepsis. EXAM: PORTABLE CHEST 1 VIEW COMPARISON:  April 18, 2022. FINDINGS: Stable cardiomediastinal silhouette. Status post aortic valve replacement. Increased bibasilar atelectasis or edema is noted with small pleural effusions. Bony thorax is unremarkable. IMPRESSION: Increased bibasilar atelectasis or edema is noted with small pleural effusions. Electronically Signed   By: Marijo Conception M.D.   On: 06/22/2022 11:15    Microbiology: Results for orders placed or performed during the hospital encounter of 07/08/22  C Difficile Quick Screen w PCR reflex     Status: None   Collection Time: 07/08/22 11:03 PM   Specimen: STOOL  Result Value Ref Range Status   C Diff antigen NEGATIVE NEGATIVE Final   C Diff toxin NEGATIVE NEGATIVE Final   C Diff interpretation No C. difficile detected.  Final    Comment: Performed at [facility], [place] [address] [place], [place] [address]   *Note: Due to a large number of results and/or encounters for the requested time period, some results have not been displayed. A complete set of results can be found in Results Review.   Labs: CBC: Recent Labs  Lab 07/09/22 0446 07/09/22 1524 07/10/22 0612 07/11/22 0000 07/11/22 0410 07/11/22 1339 07/12/22 0433 07/13/22 0434 07/14/22 0425  WBC 10.1  --  13.4*  --  9.5  --  7.4 6.6 4.7  NEUTROABS 8.0*  --   --   --   --   --   --  5.0 3.7  HGB 7.1*   < > 7.1*   < > 9.1* 8.6* 8.4* 7.9* 7.9*  HCT 21.1*   < > 21.2*   < > 27.1* 25.7* 25.5* 24.5* 25.0*  MCV 89.8  --  91.8  --  92.8  --  95.5 96.5 98.4  PLT 149*  --  136*  --  104*  --  109* 104* 99*   < > = values in this interval not displayed.   Basic Metabolic Panel: Recent Labs  Lab 07/09/22 0446 07/10/22 0612 07/11/22 0410 07/12/22 0433 07/13/22 0434 07/14/22 0425  NA 135 133* 138 140 140 141  K 4.6 4.8 4.4 4.2 4.1 4.6  CL 107 105 110  109 113* 113*  CO2 18* 17* 18* 19* 19* 20*  GLUCOSE 321* 379* 229* 80 68* 79  BUN 139* 156* 141* 121* 97* 84*  CREATININE 2.69* 3.41* 3.29* 2.95* 2.78* 2.44*  CALCIUM 7.6* 7.7* 8.1* 8.3* 8.2* 8.1*  MG 2.0  --  2.3  --  2.4 2.3  PHOS  --   --   --   --  5.0* 5.4*   Liver Function Tests: Recent Labs  Lab 07/08/22 1932 07/09/22 0446 07/13/22 0434 07/14/22 0425  AST '23 22 22 23  '$ ALT '16 16 14 14  '$ ALKPHOS 53 49 49 53  BILITOT 0.7 0.7 0.6 0.7  PROT 4.3* 4.1* 4.4* 4.2*  ALBUMIN 2.5*  2.4* 2.6* 2.4*   CBG: Recent Labs  Lab 07/13/22 1031 07/13/22 1723 07/13/22 2150 07/14/22 0741 07/14/22 1145  GLUCAP 108* 201* 98 103* 122*   Discharge time spent: greater than 30 minutes.  Signed: Raiford Noble, DO [facility] 07/14/2022

## 2022-07-14 NOTE — Progress Notes (Signed)
Daily Progress Note   Patient Name: [name]       Date: 07/14/2022 DOB: 1941/06/02  Age: 81 y.o. MRN#: [id number] Attending Physician: Kerney Elbe, DO Primary Care Physician: [name], MD Admit Date: 07/08/2022 Length of Stay: 6 days  Reason for Consultation/Follow-up: Establishing goals of care and Pain control  Subjective:   CC: Patient notes abdominal pain greatly improved today as compared to yesterday..  Subjective:  Resting in bed, daughter at bedside, EGD with esophageal candida, started on anti fungal, participates with PT, discussed with patient regarding why we are recommending SNF rehab with palliative,     Objective:   Vital Signs:  BP (!) 140/53 (BP Location: Left Arm)   Pulse 88   Temp 97.9 F (36.6 C) (Oral)   Resp 16   Ht '4\' 11"'$  (1.499 m)   Wt 67.4 kg   SpO2 99%   BMI 30.01 kg/m   Physical Exam: General: NAD, alert, laying in bed Eyes: conjunctiva clear, anicteric sclera HENT: normocephalic, atraumatic, moist mucous membranes Cardiovascular: tachycardia noted Respiratory: no increase work of breathing noted, not in respiratory distress Abdomen: tender to palpation Extremities: chronic venous statis in LE b/l Skin: no rash noted on visible skin Neuro: A&Ox4    Assessment & Plan:   Assessment: Patient is a 81 year old female with a past medical history of type 2 diabetes, autoimmune hemolytic anemia, hyperlipidemia, CKD stage IV, 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 difficile colitis who was admitted on 9/30 for GI bleed diverticulitis.  Out of consulted to assist with complex medical decision making.  Recommendations/Plan: # Complex medical decision making/goals of care:  -GI following, EGD with white plaques in esophagus   -Encouraged completion of HCPOA/ACP documentation.  Patient and son will review and inform staff if wanting  to complete while hospitalized.  -Currently finding returns to the hospital "beneficial".  We did discuss that should patient ever deem hospitalizations and outpatient follow-up with other providers is becoming too burdensome and she would like to focus on quality time at home with family, could consider transition to hospice at that time.  As patient wants to new with current medical interventions, place consult for [facility] to assist with referral for home palliative care.  Patient and son appreciate continued palliative care involvement.  -  Code Status: DNR  # Symptom management   -Pain, acute abdominal secondary to diverticulitis   Pain currently improved.   -  Dilaudid 1-2 mg p.o. solution every 6 hours as needed for moderate or severe pain.  Oral medication should be given prior to IV medication.  Should the medication not be lasting between the 6-hour intervals, could adjust frequency to every 4 hours as needed.   - Continue Dilaudid 1 mg every 3 hours as needed for severe pain.   -Started Tylenol 1000 mg 3 times daily scheduled.  # Discharge Planning: SNF rehab with palliative care for continued follow-up.  Discussed with: Patient, patient's daughter    Thank you for allowing [place] to participate in the care of BHAVANA KADY  This provider spent a total of 54 minutes providing patient's care.  Includes review of EMR, discussing care with other staff members involved in patient's medical care, obtaining relevant history and information from patient and/or patient's family, coordination of care, and personal review of imaging and lab work. Greater than 50% of the time was spent counseling and coordinating care related to  the above assessment and plan.    Gibson MD Palliative Care Provider Team Phone # [phone] (Nights/Weekends)

## 2022-07-14 NOTE — Progress Notes (Signed)
Physical Therapy Treatment Patient Details Name: [name] MRN: [id number] DOB: 1941-09-22 Today's Date: 07/14/2022   History of Present Illness 81 y.o. 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 difficile colitis. Recent admission 9/14-9/17/23 for a UTI. Pt presented to [facility] ED on 9/30 via EMS with abdominal pain, diarrhea, dark stools. Dx of upper GIB, ABLA    PT Comments    Pt assisted with ambulating however only tolerated short distance.  Pt fatigues quickly.  Pt requesting to brush her dentures but felt unable to stand at sink after ambulating so set up for brushing teeth with pt in recliner and tray table.  Pt would benefit from SNF if pt/family choose this upon d/c.    Recommendations for follow up therapy are one component of a multi-disciplinary discharge planning process, led by the attending physician.  Recommendations may be updated based on patient status, additional functional criteria and insurance authorization.  Follow Up Recommendations  Skilled nursing-short term rehab (<3 hours/day) Can patient physically be transported by private vehicle: Yes   Assistance Recommended at Discharge Frequent or constant Supervision/Assistance  Patient can return home with the following A little help with bathing/dressing/bathroom;Help with stairs or ramp for entrance;Assistance with cooking/housework;Assist for transportation;A little help with walking and/or transfers   Equipment Recommendations  None recommended by PT    Recommendations for Other Services       Precautions / Restrictions Precautions Precautions: Fall     Mobility  Bed Mobility Overal bed mobility: Needs Assistance Bed  Mobility: Supine to Sit     Supine to sit: Supervision          Transfers Overall transfer level: Needs assistance Equipment used: Rolling walker (2 wheels) Transfers: Sit to/from Stand Sit to Stand: Min assist, From elevated surface           General transfer comment: verbal cues for technique, assist to rise and steady    Ambulation/Gait Ambulation/Gait assistance: Min assist, +2 safety/equipment Gait Distance (Feet): 15 Feet Assistive device: Rolling walker (2 wheels) Gait Pattern/deviations: Step-through pattern, Decreased stride length, Trunk flexed Gait velocity: decr     General Gait Details: Assist to stabilize, Cues for safety, RW proximity. Pt fatigues fairly easily so recliner following   Stairs             Wheelchair Mobility    Modified Rankin (Stroke Patients Only)       Balance Overall balance assessment: Needs assistance         Standing balance support: Bilateral upper extremity supported, Reliant on assistive device for balance Standing balance-Leahy Scale: Poor                              Cognition Arousal/Alertness: Awake/alert Behavior During Therapy: WFL for tasks assessed/performed Overall Cognitive Status: Within Functional Limits for tasks assessed                                          Exercises      General Comments        Pertinent Vitals/Pain Pain Assessment Pain Assessment: No/denies pain Pain Intervention(s): Monitored during session, Repositioned  Home Living                          Prior Function            PT Goals (current goals can now be found in the care plan section) Progress towards PT goals: Progressing toward goals    Frequency    Min 3X/week      PT Plan Current plan remains appropriate    Co-evaluation              AM-PAC PT "6 Clicks" Mobility   Outcome Measure  Help needed turning from your back to your side while in a  flat bed without using bedrails?: A Little Help needed moving from lying on your back to sitting on the side of a flat bed without using bedrails?: A Little Help needed moving to and from a bed to a chair (including a wheelchair)?: A Little Help needed standing up from a chair using your arms (e.g., wheelchair or bedside chair)?: A Little Help needed to walk in hospital room?: A Lot Help needed climbing 3-5 steps with a railing? : Total 6 Click Score: 15    End of Session Equipment Utilized During Treatment: Gait belt Activity Tolerance: Patient tolerated treatment well;Patient limited by fatigue Patient left: in chair;with call bell/phone within reach;with chair alarm set;with family/visitor present Nurse Communication: Mobility status PT Visit Diagnosis: Muscle weakness (generalized) (M62.81);Difficulty in walking, not elsewhere classified (R26.2)     Time: 6063-0160 PT Time Calculation (min) (ACUTE ONLY): 14 min  Charges:  $Gait Training: 8-22 mins                    Jannette Spanner PT, DPT Physical Therapist Acute Rehabilitation Services Preferred contact method: Secure Chat Weekend Pager Only: [phone] Office: [place] 07/14/2022, 12:07 PM

## 2022-07-15 ENCOUNTER — Other Ambulatory Visit: Payer: Self-pay | Admitting: Cardiovascular Disease

## 2022-07-17 ENCOUNTER — Ambulatory Visit: Payer: Self-pay

## 2022-07-17 ENCOUNTER — Encounter (HOSPITAL_COMMUNITY): Payer: Self-pay | Admitting: Gastroenterology

## 2022-07-17 DIAGNOSIS — I5032 Chronic diastolic (congestive) heart failure: Secondary | ICD-10-CM | POA: Diagnosis not present

## 2022-07-17 DIAGNOSIS — D591 Autoimmune hemolytic anemia, unspecified: Secondary | ICD-10-CM | POA: Diagnosis not present

## 2022-07-17 DIAGNOSIS — N184 Chronic kidney disease, stage 4 (severe): Secondary | ICD-10-CM | POA: Diagnosis not present

## 2022-07-17 DIAGNOSIS — M6281 Muscle weakness (generalized): Secondary | ICD-10-CM | POA: Diagnosis not present

## 2022-07-17 DIAGNOSIS — K5740 Diverticulitis of both small and large intestine with perforation and abscess without bleeding: Secondary | ICD-10-CM | POA: Diagnosis not present

## 2022-07-17 DIAGNOSIS — K922 Gastrointestinal hemorrhage, unspecified: Secondary | ICD-10-CM | POA: Diagnosis not present

## 2022-07-17 DIAGNOSIS — E8721 Acute metabolic acidosis: Secondary | ICD-10-CM | POA: Diagnosis not present

## 2022-07-17 DIAGNOSIS — R600 Localized edema: Secondary | ICD-10-CM | POA: Diagnosis not present

## 2022-07-17 DIAGNOSIS — D500 Iron deficiency anemia secondary to blood loss (chronic): Secondary | ICD-10-CM | POA: Diagnosis not present

## 2022-07-17 NOTE — Patient Outreach (Signed)
  Care Coordination   Follow Up Visit Note   07/17/2022 Name: [name] MRN: [id number] DOB: 20-Jul-1941  [name] is a 81 y.o. year old female who sees [name], [name], MD for primary care. Case Collaboration regarding patient's inpatient admission.   What matters to the patients health and wellness today?  Patient is currently inpatient.     Goals Addressed             This Visit's Progress    Care Coordination Activities: further follow up needed       Care Coordination Interventions: Evaluation of current treatment plan related to GI bleeding and patient's adherence to plan as established by provider Received the following hospital update from Natividad Brood, T[place] "Good morning! Patient did transition to [facility] 07/14/22 for [place] rehab, has [facility]"           SDOH assessments and interventions completed:  No     Care Coordination Interventions Activated:  Yes  Care Coordination Interventions:  Yes, provided   Follow up plan: Follow up call scheduled for 08/08/22 '@11'$ :30 AM    Encounter Outcome:  Pt. Visit Completed

## 2022-07-18 DIAGNOSIS — I700 Atherosclerosis of aorta: Secondary | ICD-10-CM | POA: Diagnosis not present

## 2022-07-18 DIAGNOSIS — I779 Disorder of arteries and arterioles, unspecified: Secondary | ICD-10-CM | POA: Diagnosis not present

## 2022-07-18 DIAGNOSIS — M6281 Muscle weakness (generalized): Secondary | ICD-10-CM | POA: Diagnosis not present

## 2022-07-18 DIAGNOSIS — K5740 Diverticulitis of both small and large intestine with perforation and abscess without bleeding: Secondary | ICD-10-CM | POA: Diagnosis not present

## 2022-07-18 DIAGNOSIS — E8721 Acute metabolic acidosis: Secondary | ICD-10-CM | POA: Diagnosis not present

## 2022-07-18 DIAGNOSIS — K922 Gastrointestinal hemorrhage, unspecified: Secondary | ICD-10-CM | POA: Diagnosis not present

## 2022-07-18 DIAGNOSIS — D638 Anemia in other chronic diseases classified elsewhere: Secondary | ICD-10-CM | POA: Diagnosis not present

## 2022-07-18 DIAGNOSIS — D591 Autoimmune hemolytic anemia, unspecified: Secondary | ICD-10-CM | POA: Diagnosis not present

## 2022-07-18 DIAGNOSIS — N184 Chronic kidney disease, stage 4 (severe): Secondary | ICD-10-CM | POA: Diagnosis not present

## 2022-07-18 DIAGNOSIS — F419 Anxiety disorder, unspecified: Secondary | ICD-10-CM | POA: Diagnosis not present

## 2022-07-18 DIAGNOSIS — D500 Iron deficiency anemia secondary to blood loss (chronic): Secondary | ICD-10-CM | POA: Diagnosis not present

## 2022-07-18 DIAGNOSIS — R600 Localized edema: Secondary | ICD-10-CM | POA: Diagnosis not present

## 2022-07-18 DIAGNOSIS — D649 Anemia, unspecified: Secondary | ICD-10-CM | POA: Diagnosis not present

## 2022-07-18 DIAGNOSIS — I5032 Chronic diastolic (congestive) heart failure: Secondary | ICD-10-CM | POA: Diagnosis not present

## 2022-07-18 DIAGNOSIS — S41101A Unspecified open wound of right upper arm, initial encounter: Secondary | ICD-10-CM | POA: Diagnosis not present

## 2022-07-18 NOTE — Chronic Care Management (AMB) (Signed)
  Care Coordination   Note   07/18/2022 Name: [name] MRN: [id number] DOB: 1941/01/27  [name] is a 81 y.o. year old female who sees [name], [name], MD for primary care. I reached out to [name] by phone today to reschedule care coordination services.   Follow up plan:  Unsuccessful telephone outreach attempt made. A HIPAA compliant phone message was left for the patient providing contact information and requesting a return call.  Encounter Outcome:  No Answer  [place]  Direct Dial: [phone]

## 2022-07-19 ENCOUNTER — Ambulatory Visit: Payer: Medicare HMO | Admitting: Obstetrics and Gynecology

## 2022-07-19 DIAGNOSIS — M6281 Muscle weakness (generalized): Secondary | ICD-10-CM | POA: Diagnosis not present

## 2022-07-19 DIAGNOSIS — E8721 Acute metabolic acidosis: Secondary | ICD-10-CM | POA: Diagnosis not present

## 2022-07-19 DIAGNOSIS — N184 Chronic kidney disease, stage 4 (severe): Secondary | ICD-10-CM | POA: Diagnosis not present

## 2022-07-19 DIAGNOSIS — K922 Gastrointestinal hemorrhage, unspecified: Secondary | ICD-10-CM | POA: Diagnosis not present

## 2022-07-19 DIAGNOSIS — I5032 Chronic diastolic (congestive) heart failure: Secondary | ICD-10-CM | POA: Diagnosis not present

## 2022-07-19 DIAGNOSIS — D500 Iron deficiency anemia secondary to blood loss (chronic): Secondary | ICD-10-CM | POA: Diagnosis not present

## 2022-07-19 DIAGNOSIS — K5740 Diverticulitis of both small and large intestine with perforation and abscess without bleeding: Secondary | ICD-10-CM | POA: Diagnosis not present

## 2022-07-19 DIAGNOSIS — D591 Autoimmune hemolytic anemia, unspecified: Secondary | ICD-10-CM | POA: Diagnosis not present

## 2022-07-19 DIAGNOSIS — R600 Localized edema: Secondary | ICD-10-CM | POA: Diagnosis not present

## 2022-07-21 ENCOUNTER — Inpatient Hospital Stay (HOSPITAL_BASED_OUTPATIENT_CLINIC_OR_DEPARTMENT_OTHER): Payer: Medicare HMO | Admitting: Adult Health

## 2022-07-21 ENCOUNTER — Inpatient Hospital Stay: Payer: Medicare HMO | Attending: Oncology

## 2022-07-21 ENCOUNTER — Encounter: Payer: Self-pay | Admitting: Adult Health

## 2022-07-21 ENCOUNTER — Ambulatory Visit (INDEPENDENT_AMBULATORY_CARE_PROVIDER_SITE_OTHER): Payer: Medicare HMO | Admitting: Cardiology

## 2022-07-21 VITALS — BP 137/64 | HR 70 | Temp 97.9°F | Wt 150.0 lb

## 2022-07-21 DIAGNOSIS — Z7901 Long term (current) use of anticoagulants: Secondary | ICD-10-CM | POA: Diagnosis not present

## 2022-07-21 DIAGNOSIS — D594 Other nonautoimmune hemolytic anemias: Secondary | ICD-10-CM | POA: Diagnosis not present

## 2022-07-21 DIAGNOSIS — I480 Paroxysmal atrial fibrillation: Secondary | ICD-10-CM | POA: Diagnosis not present

## 2022-07-21 DIAGNOSIS — K922 Gastrointestinal hemorrhage, unspecified: Secondary | ICD-10-CM | POA: Insufficient documentation

## 2022-07-21 DIAGNOSIS — I5032 Chronic diastolic (congestive) heart failure: Secondary | ICD-10-CM | POA: Diagnosis not present

## 2022-07-21 DIAGNOSIS — I7782 Antineutrophilic cytoplasmic antibody (ANCA) vasculitis: Secondary | ICD-10-CM | POA: Insufficient documentation

## 2022-07-21 DIAGNOSIS — R197 Diarrhea, unspecified: Secondary | ICD-10-CM | POA: Insufficient documentation

## 2022-07-21 DIAGNOSIS — M8580 Other specified disorders of bone density and structure, unspecified site: Secondary | ICD-10-CM | POA: Insufficient documentation

## 2022-07-21 DIAGNOSIS — D591 Autoimmune hemolytic anemia, unspecified: Secondary | ICD-10-CM | POA: Diagnosis not present

## 2022-07-21 DIAGNOSIS — N184 Chronic kidney disease, stage 4 (severe): Secondary | ICD-10-CM | POA: Insufficient documentation

## 2022-07-21 DIAGNOSIS — I700 Atherosclerosis of aorta: Secondary | ICD-10-CM | POA: Diagnosis not present

## 2022-07-21 DIAGNOSIS — D631 Anemia in chronic kidney disease: Secondary | ICD-10-CM | POA: Insufficient documentation

## 2022-07-21 DIAGNOSIS — Z5181 Encounter for therapeutic drug level monitoring: Secondary | ICD-10-CM | POA: Diagnosis not present

## 2022-07-21 DIAGNOSIS — D649 Anemia, unspecified: Secondary | ICD-10-CM | POA: Diagnosis not present

## 2022-07-21 DIAGNOSIS — I779 Disorder of arteries and arterioles, unspecified: Secondary | ICD-10-CM | POA: Diagnosis not present

## 2022-07-21 DIAGNOSIS — R7402 Elevation of levels of lactic acid dehydrogenase (LDH): Secondary | ICD-10-CM | POA: Diagnosis not present

## 2022-07-21 DIAGNOSIS — D638 Anemia in other chronic diseases classified elsewhere: Secondary | ICD-10-CM | POA: Diagnosis not present

## 2022-07-21 DIAGNOSIS — F419 Anxiety disorder, unspecified: Secondary | ICD-10-CM | POA: Diagnosis not present

## 2022-07-21 DIAGNOSIS — R600 Localized edema: Secondary | ICD-10-CM | POA: Diagnosis not present

## 2022-07-21 LAB — CBC WITH DIFFERENTIAL/PLATELET
Abs Immature Granulocytes: 0.07 10*3/uL (ref 0.00–0.07)
Basophils Absolute: 0 10*3/uL (ref 0.0–0.1)
Basophils Relative: 0 %
Eosinophils Absolute: 0 10*3/uL (ref 0.0–0.5)
Eosinophils Relative: 0 %
HCT: 24.4 % — ABNORMAL LOW (ref 36.0–46.0)
Hemoglobin: 8.2 g/dL — ABNORMAL LOW (ref 12.0–15.0)
Immature Granulocytes: 1 %
Lymphocytes Relative: 8 %
Lymphs Abs: 0.4 10*3/uL — ABNORMAL LOW (ref 0.7–4.0)
MCH: 31.8 pg (ref 26.0–34.0)
MCHC: 33.6 g/dL (ref 30.0–36.0)
MCV: 94.6 fL (ref 80.0–100.0)
Monocytes Absolute: 0.3 10*3/uL (ref 0.1–1.0)
Monocytes Relative: 7 %
Neutro Abs: 4.1 10*3/uL (ref 1.7–7.7)
Neutrophils Relative %: 84 %
Platelets: 104 10*3/uL — ABNORMAL LOW (ref 150–400)
RBC: 2.58 MIL/uL — ABNORMAL LOW (ref 3.87–5.11)
RDW: 18.6 % — ABNORMAL HIGH (ref 11.5–15.5)
WBC: 5 10*3/uL (ref 4.0–10.5)
nRBC: 0 % (ref 0.0–0.2)

## 2022-07-21 LAB — CMP (CANCER CENTER ONLY)
ALT: 17 U/L (ref 0–44)
AST: 23 U/L (ref 15–41)
Albumin: 3 g/dL — ABNORMAL LOW (ref 3.5–5.0)
Alkaline Phosphatase: 87 U/L (ref 38–126)
Anion gap: 9 (ref 5–15)
BUN: 43 mg/dL — ABNORMAL HIGH (ref 8–23)
CO2: 19 mmol/L — ABNORMAL LOW (ref 22–32)
Calcium: 7.8 mg/dL — ABNORMAL LOW (ref 8.9–10.3)
Chloride: 107 mmol/L (ref 98–111)
Creatinine: 2.05 mg/dL — ABNORMAL HIGH (ref 0.44–1.00)
GFR, Estimated: 24 mL/min — ABNORMAL LOW (ref >60)
Glucose, Bld: 248 mg/dL — ABNORMAL HIGH (ref 70–99)
Potassium: 4 mmol/L (ref 3.5–5.1)
Sodium: 135 mmol/L (ref 135–145)
Total Bilirubin: 0.4 mg/dL (ref 0.3–1.2)
Total Protein: 4.9 g/dL — ABNORMAL LOW (ref 6.5–8.1)

## 2022-07-21 LAB — RETIC PANEL
Immature Retic Fract: 7.2 % (ref 2.3–15.9)
RBC.: 2.6 MIL/uL — ABNORMAL LOW (ref 3.87–5.11)
Retic Count, Absolute: 81.9 10*3/uL (ref 19.0–186.0)
Retic Ct Pct: 3.2 % — ABNORMAL HIGH (ref 0.4–3.1)
Reticulocyte Hemoglobin: 30.5 pg (ref >27.9)

## 2022-07-21 LAB — PROTIME-INR
INR: 3.6 — ABNORMAL HIGH (ref 0.8–1.2)
Prothrombin Time: 35.8 seconds — ABNORMAL HIGH (ref 11.4–15.2)

## 2022-07-21 NOTE — Progress Notes (Signed)
[facility] Follow up:    [name], [name], MD [place] [address]   DIAGNOSIS: Hemolytic Anemia  SUMMARY OF HEMATOLOGIC HISTORY: [name] is a 81 y.o. [place] woman with multifactorial anemia, as follows:             (a) anemia of chronic inflammation: As of May 2020 she has a SED rate of 95, CRP 1.1, positive ANA and RF; subsequently she was found to be ANCA positive             (b) hemolysis (mild): she has a mildly elevated LDH and a DAT positive for complement, not IgG; this is c/w (a) above             (c) anemia of renal insufficiency: inadequate EPO resonse to anemia                         (d) GIB/ iron deficiency in patient on lifelong anticoagulaton   (1) Feraheme received 03/01/2019, repeated 03/20/2019              (a) reticulocyte 03/07/2019 up from 43.4 a year ago to 191.8 after iron infusion             (b) feraheme repeated on 05/12/2019             (c) subsequent ferritin levels >100   (2) darbepoetin: starting 05/05/2019 at 154mg given every 2 weeks             (a) dose increased to 200 mcg every week starting 07/01/2019              (b) back to every 2 weeks beginning 08/04/2019 still at 200 mcg dose             (c) switched to Retacrit every 4 weeks as of 08/18/2019   (3) ANCA positive vasculitis: diagnosed 05/08/2019 (titer 1:640)             (a) prednisone 40 mg daily started 05/31/2019             (b) rituximab weekly started 06/12/2019 (received test dose 06/05/2019)                         (i) hepatitis B sAg, core Ab and HIV negative 05/08/2019                         (ii) rituximab held after 07/14/2019 dose (received 5 weekly doses) with neutropenia                         (iii) rituximab resumed 08/18/2019 but changed to monthly                         (iv) rituximab changed to every 2 months beginning with 02/02/2020 dose                         (v) rituximab changed to every 12 weeks after the  10/12/2020 dose             (c) prednisone tapered to off as of 09/15/2019                          (4) Anemia worsened in spring 2023,  refractory to Rituximab and requiring prednisone restart with slow taper due currently at '20mg'$  which she will stay at.   (5) osteopenia with a T score of -1.5 on bone density 02/28/2016 at the [facility] center  CURRENT THERAPY: Prednsione 20 mg  INTERVAL HISTORY: [name] 81 y.o. female returns for follow-up of her hemolytic anemia.  She was recently admitted to the hospital on September 30 and discharged on October 6 for acute GI bleed.  She has been at a skilled nursing facility and rehab since her discharge and is feeling quite well.  Her hemoglobin is 8.2 today.  Her son is with her today and notes that the rehab facility has been attempting to take her off prednisone and asked that I make it clear in the note that she needs to stay on the prednisone.  She denies any more abdominal discomfort but does note that she continues to have loose bowel movements.  She denies any watery bowel movements.  She is working on regaining her strength and has no other questions or concerns.  Patient Active Problem List   Diagnosis Date Noted   Palliative care encounter    Goals of care, counseling/discussion    Counseling and coordination of care    GI bleed 07/09/2022   GI bleeding 07/08/2022   Acute on chronic anemia 07/08/2022   History of Clostridioides difficile colitis 07/08/2022   DNR (do not resuscitate)/DNI(Do Not Intubate) 07/08/2022   Anemia of chronic disease 07/07/2022   Pressure injury of skin 06/23/2022   UTI (urinary tract infection) 06/22/2022   Joint pain 06/09/2022   Parietoalveolar pneumopathy ([place]) 06/09/2022   Primary osteoarthritis 06/09/2022   Rheumatoid factor positive 06/09/2022   C. difficile colitis 04/19/2022   Colitis 04/18/2022   PAH (pulmonary artery hypertension) ([place]) 04/18/2022   Emphysematous cystitis 04/13/2022   Female  bladder prolapse 04/13/2022   Diverticulitis 04/12/2022   Autoimmune hemolytic anemia ([place]) 01/02/2022   Bilateral lower extremity edema 01/02/2022   Refractory anemia (HCC) 12/15/2021   Abnormal chest x-ray 12/15/2021   Supratherapeutic INR 12/15/2021   Elevated troponin 12/15/2021   Unilateral primary osteoarthritis, right hip 05/31/2021   Midline cystocele 05/30/2021   Primary localized osteoarthritis of pelvic region and thigh 05/30/2021   Vaginal pain 05/30/2021   Asymmetric SNHL (sensorineural hearing loss) 01/06/2021   Referred otalgia of both ears 01/06/2021   Bilateral hearing loss 08/31/2020   Low back pain 02/20/2020   CKD (chronic kidney disease), stage IV ([place]) 10/13/2019   Leukopenia due to antineoplastic chemotherapy ([place]) 08/18/2019   Thrombophilia ([place]) 06/19/2019   Encounter for general adult medical examination without abnormal findings 06/13/2019   Arteritis ([place]) 06/06/2019   Hypertensive heart and chronic kidney disease with heart failure and stage 1 through stage 4 chronic kidney disease, or unspecified chronic kidney disease ([place]) 06/06/2019   Muscle weakness 06/06/2019   Pancytopenia ([place]) 05/28/2019   Dyspnea 05/28/2019   Vasculitis, ANCA positive 05/28/2019   Disorder of connective tissue ([place]) 05/14/2019   Renal insufficiency 05/08/2019   PAF (paroxysmal atrial fibrillation) ([place]) 04/07/2019   Iron deficiency anemia 02/28/2019   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 ([place]) 02/28/2019   Diabetic retinopathy associated with type 2 diabetes mellitus ([place]) 09/02/2018   Chronic pain 06/19/2018   Non-thrombocytopenic purpura ([place]) 05/21/2018   Gout 03/22/2018   Malnutrition of moderate degree 03/12/2018   Diabetes mellitus type 2, controlled ([place]) 03/06/2018   H/O mechanical aortic valve replacement 03/06/2018   Moderate to severe  pulmonary hypertension ([place]) 03/06/2018   GERD (gastroesophageal reflux disease) 02/21/2018   Anxiety  02/21/2018   Chronic diastolic CHF (congestive heart failure) ([place]) 02/21/2018   General weakness 02/02/2018   Thrombocytopenia ([place]) 01/25/2018   Arm pain, diffuse 01/25/2018   Anemia 01/23/2018   Aortic atherosclerosis ([place]) 01/23/2018   Age-related osteoporosis without current pathological fracture 05/24/2017   Carotid artery occlusion 05/24/2017   Generalized anxiety disorder 05/24/2017   Hearing loss 05/24/2017   Presence of prosthetic heart valve 05/24/2017   Carotid artery disease ([place]) 08/18/2013   Peripheral arterial disease ([place]) 08/18/2013   Diabetes mellitus without complication ([place]) 86/57/8469   Hypertension    Hypercholesteremia    Mechanical heart valve present    Chronic anticoagulation     is allergic to flagyl [metronidazole], augmentin [amoxicillin-pot clavulanate], ciprofloxacin, coreg [carvedilol], diflucan [fluconazole], losartan, privigen [immune globulin (human)], sulfamethoxazole-trimethoprim, verapamil, zetia [ezetimibe], zocor [simvastatin - high dose], and gatifloxacin.  MEDICAL HISTORY: Past Medical History:  Diagnosis Date   Aortic atherosclerosis ([place]) 01/23/2018   Atrial fibrillation (HCC)    Benign positional vertigo 10/06/2011   Carotid artery disease (HCC)    Chemotherapy induced neutropenia ([place]) 07/21/2019   Cholelithiasis 01/23/2018   Cholelithiasis 01/23/2018   Chronic anticoagulation    Colovesical fistula, ruled out 04/14/2022   Coronary artery disease    status post coronary artery bypass grafting times 07/10/2004   Diabetes mellitus    Hypercholesteremia    Hypertension    Mechanical heart valve present    H. aortic valve replacement at the time of bypass surgery October 2005   Moderate to severe pulmonary hypertension ([place])    Peripheral arterial disease ([place])    history of left common iliac artery PTA and stenting for a chronic total occlusion 08/26/01   S/P cholecystectomy 03/06/2018    SURGICAL HISTORY: Past Surgical  History:  Procedure Laterality Date   anterior repair  2009   AORTIC VALVE REPLACEMENT  2005   Dr. Cyndia Bent   CARDIAC CATHETERIZATION  11/10/2004   40% right common illiac, 70% in stent restenosis of distal left common illiac,    CARDIAC CATHETERIZATION  05/18/2004   LAD 50-70% midstenosis, RCA dominant w/50% stenosis, 50% Right common Illiac artery ostial stenosis, 90% in stent restenosis within midportion of left common illiac stent   Carotid Duplex  03/12/2012   RSA-elev. velocities suggestive of a 50-69% diameter reduction, Right&Left Bulb/Prox ICA-mild-mod.fibrous plaqueelevating Velocities abnormal study.   CHOLECYSTECTOMY N/A 03/01/2018   Procedure: LAPAROSCOPIC CHOLECYSTECTOMY;  Surgeon: Judeth Horn, MD;  Location: [facility];  Service: General;  Laterality: N/A;   COLONOSCOPY WITH PROPOFOL N/A 01/22/2018   Procedure: COLONOSCOPY WITH PROPOFOL;  Surgeon: Wilford Corner, MD;  Location: [facility];  Service: Endoscopy;  Laterality: N/A;   ESOPHAGOGASTRODUODENOSCOPY N/A 07/13/2022   Procedure: ESOPHAGOGASTRODUODENOSCOPY (EGD);  Surgeon: [name], MD;  Location: [facility];  Service: Gastroenterology;  Laterality: N/A;   ESOPHAGOGASTRODUODENOSCOPY (EGD) WITH PROPOFOL N/A 01/22/2018   Procedure: ESOPHAGOGASTRODUODENOSCOPY (EGD) WITH PROPOFOL;  Surgeon: Wilford Corner, MD;  Location: [facility];  Service: Endoscopy;  Laterality: N/A;   ESOPHAGOGASTRODUODENOSCOPY (EGD) WITH PROPOFOL N/A 11/21/2018   Procedure: ESOPHAGOGASTRODUODENOSCOPY (EGD) WITH PROPOFOL;  Surgeon: [name], MD;  Location: [facility];  Service: Gastroenterology;  Laterality: N/A;   ESOPHAGOGASTRODUODENOSCOPY (EGD) WITH PROPOFOL Left 02/27/2019   Procedure: ESOPHAGOGASTRODUODENOSCOPY (EGD) WITH PROPOFOL;  Surgeon: Arta Silence, MD;  Location: [facility] ENDOSCOPY;  Service: Endoscopy;  Laterality: Left;   GIVENS CAPSULE STUDY N/A 11/21/2018   Procedure: GIVENS CAPSULE STUDY;  Surgeon:  [name], MD;  Location: [facility]  ENDOSCOPY;  Service: Gastroenterology;  Laterality: N/A;  To be deployed during EGD   Lower Ext. Duplex  03/12/2012   Right Proximal CIA- vessel narrowing w/elevated velocities 0-49% diameter reduction. Right SFA-mild mixed density plaque throughout vessel.   NM MYOCAR PERF WALL MOTION  05/19/2010   protocol: Persantine, post stress EF 65%, negative for ischemia, low risk scan   RIGHT HEART CATH N/A 06/27/2018   Procedure: RIGHT HEART CATH;  Surgeon: [name], MD;  Location: [facility];  Service: Cardiovascular;  Laterality: N/A;   TOTAL ABDOMINAL HYSTERECTOMY W/ BILATERAL SALPINGOOPHORECTOMY  1989   TRANSTHORACIC ECHOCARDIOGRAM  08/29/2012   Moderately calcified annulus of mitral valve, moderate regurg. of both mitral valve and tricuspid valve.     SOCIAL HISTORY: Social History   Socioeconomic History   Marital status: Widowed    Spouse name: Not on file   Number of children: Not on file   Years of education: Not on file   Highest education level: Not on file  Occupational History   Occupation: Retired  Tobacco Use   Smoking status: Former    Packs/day: 1.00    Years: 30.00    Total pack years: 30.00    Types: Cigarettes   Smokeless tobacco: Never   Tobacco comments:    quit smoking 2005  Vaping Use   Vaping Use: Never used  Substance and Sexual Activity   Alcohol use: No    Alcohol/week: 0.0 standard drinks of alcohol   Drug use: No   Sexual activity: Not Currently    Birth control/protection: None  Other Topics Concern   Not on file  Social History Narrative   Not on file   Social Determinants of Health   Financial Resource Strain: Low Risk  (02/28/2022)   Overall Financial Resource Strain (CARDIA)    Difficulty of Paying Living Expenses: Not hard at all  Food Insecurity: No Food Insecurity (07/09/2022)   Hunger Vital Sign    Worried About Running Out of Food in the Last Year: Never true    [employer] in the Last Year: Never true   Transportation Needs: No Transportation Needs (07/09/2022)   PRAPARE - Hydrologist (Medical): No    Lack of Transportation (Non-Medical): No  Physical Activity: Inactive (02/28/2022)   Exercise Vital Sign    Days of Exercise per Week: 0 days    Minutes of Exercise per Session: 0 min  Stress: No Stress Concern Present (02/28/2022)   [place]    Feeling of Stress : Only a little  Social Connections: Moderately Integrated (06/22/2022)   Social Connection and Isolation Panel [NHANES]    Frequency of Communication with Friends and Family: More than three times a week    Frequency of Social Gatherings with Friends and Family: More than three times a week    Attends Religious Services: More than 4 times per year    Active Member of [employer] or Organizations: Yes    Attends Archivist Meetings: More than 4 times per year    Marital Status: Widowed  Intimate Partner Violence: Not At Risk (07/09/2022)   Humiliation, Afraid, Rape, and Kick questionnaire    Fear of Current or Ex-Partner: No    Emotionally Abused: No    Physically Abused: No    Sexually Abused: No    FAMILY HISTORY: Family History  Problem Relation  Age of Onset   Heart disease Father    Hypertension Father    Hyperlipidemia Father    Breast cancer Neg Hx     Review of Systems  Constitutional:  Positive for fatigue. Negative for appetite change, chills, fever and unexpected weight change.  HENT:   Negative for hearing loss, lump/mass, mouth sores, trouble swallowing and voice change.   Eyes:  Negative for eye problems and icterus.  Respiratory:  Negative for chest tightness, cough and shortness of breath.   Cardiovascular:  Positive for leg swelling. Negative for chest pain and palpitations.  Gastrointestinal:  Positive for diarrhea (Loose not watery). Negative for abdominal distention, abdominal pain, blood in stool,  constipation, nausea, rectal pain and vomiting.  Endocrine: Negative for hot flashes.  Genitourinary:  Negative for difficulty urinating.   Musculoskeletal:  Negative for arthralgias.  Skin:  Negative for itching and rash.  Neurological:  Negative for dizziness, extremity weakness, headaches and numbness.  Hematological:  Negative for adenopathy. Does not bruise/bleed easily.  Psychiatric/Behavioral:  Negative for depression. The patient is not nervous/anxious.       PHYSICAL EXAMINATION  ECOG PERFORMANCE STATUS: 1 - Symptomatic but completely ambulatory  Vitals:   07/21/22 1128  BP: 137/64  Pulse: 70  Temp: 97.9 F (36.6 C)  SpO2: 100%    Physical Exam Constitutional:      General: She is not in acute distress.    Appearance: Normal appearance. She is not toxic-appearing.  HENT:     Head: Normocephalic and atraumatic.  Eyes:     General: No scleral icterus. Cardiovascular:     Rate and Rhythm: Normal rate. Rhythm irregular.     Pulses: Normal pulses.     Heart sounds: Murmur (Systolic murmur with a click.) heard.  Pulmonary:     Effort: Pulmonary effort is normal.     Breath sounds: Rhonchi (Very fine mild rhonchi in the left lower lobe) present.  Abdominal:     General: Abdomen is flat. Bowel sounds are normal. There is distension (Slight distention).     Palpations: Abdomen is soft.     Tenderness: There is no abdominal tenderness.  Musculoskeletal:        General: Swelling present.     Cervical back: Neck supple.     Right lower leg: Edema present.     Left lower leg: Edema present.  Lymphadenopathy:     Cervical: No cervical adenopathy.  Skin:    General: Skin is warm and dry.     Findings: Bruising (Small areas of ecchymosis on the left lower leg.) present. No rash.  Neurological:     General: No focal deficit present.     Mental Status: She is alert.  Psychiatric:        Mood and Affect: Mood normal.        Behavior: Behavior normal.     LABORATORY  DATA:  CBC    Component Value Date/Time   WBC 5.0 07/21/2022 1056   RBC 2.58 (L) 07/21/2022 1056   RBC 2.60 (L) 07/21/2022 1056   HGB 8.2 (L) 07/21/2022 1056   HGB 7.8 (L) 05/25/2022 1131   HCT 24.4 (L) 07/21/2022 1056   HCT 28.5 (L) 11/20/2018 1824   PLT 104 (L) 07/21/2022 1056   PLT 105 (L) 05/25/2022 1131   MCV 94.6 07/21/2022 1056   MCH 31.8 07/21/2022 1056   MCHC 33.6 07/21/2022 1056   RDW 18.6 (H) 07/21/2022 1056   LYMPHSABS 0.4 (L) 07/21/2022 1056  MONOABS 0.3 07/21/2022 1056   EOSABS 0.0 07/21/2022 1056   BASOSABS 0.0 07/21/2022 1056    CMP     Component Value Date/Time   NA 135 07/21/2022 1056   NA 137 12/06/2017 1436   K 4.0 07/21/2022 1056   CL 107 07/21/2022 1056   CO2 19 (L) 07/21/2022 1056   GLUCOSE 248 (H) 07/21/2022 1056   BUN 43 (H) 07/21/2022 1056   BUN 20 12/06/2017 1436   CREATININE 2.05 (H) 07/21/2022 1056   CALCIUM 7.8 (L) 07/21/2022 1056   PROT 4.9 (L) 07/21/2022 1056   ALBUMIN 3.0 (L) 07/21/2022 1056   AST 23 07/21/2022 1056   ALT 17 07/21/2022 1056   ALKPHOS 87 07/21/2022 1056   BILITOT 0.4 07/21/2022 1056   GFRNONAA 24 (L) 07/21/2022 1056   GFRAA 32 (L) 03/30/2020 0932     ASSESSMENT and THERAPY PLAN:   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 (HCC) [name] is an 81 year old woman here today for follow-up of her hemolytic anemia associated with chronic inflammatory disease.  She continues on prednisone with good tolerance and will continue this.  Specific recommendations for her to stay on the prednisone.  We reviewed her labs in detail which indicate a hemoglobin of 8.2.  She is undergoing lab checks at the skilled nursing facility and I gave her son my card so that we could ask if they will fax their lab results.  Since we can get her lab results faxed to [place] she will see Dr. Chryl Heck in 2 weeks for labs and follow-up.  This will coincide well with the time after she is discharged from the skilled facility.  In  regards to her loose bowel movements I gave her a handout on a low FODMAP diet and she is going to review this with the dietitian at the facility where she is staying.  She has very complementary things to say about him so I am hopeful that they will be able to work out a plan and perhaps this will work in decreasing her loose stool.  [name] will return in 2 weeks for labs and follow-up.  They know to call for any questions or concerns in the meantime.  All questions were answered. The patient knows to call the clinic with any problems, questions or concerns. We can certainly see the patient much sooner if necessary.  Total encounter time:30 minutes*in face-to-face visit time, chart review, lab review, care coordination, order entry, and documentation of the encounter time.    Wilber Bihari, NP 07/21/22 1:40 PM Medical Oncology and Hematology [facility] [place], [place] [address] Tel. [phone]    Fax. [phone]  *Total Encounter Time as defined by the Centers for Medicare and Medicaid Services includes, in addition to the face-to-face time of a patient visit (documented in the note above) non-face-to-face time: obtaining and reviewing outside history, ordering and reviewing medications, tests or procedures, care coordination (communications with other health care professionals or caregivers) and documentation in the medical record.

## 2022-07-21 NOTE — Patient Instructions (Signed)
Low-FODMAP Eating Plan  FODMAP stands for fermentable oligosaccharides, disaccharides, monosaccharides, and polyols. These are sugars that are hard for some people to digest. A low-FODMAP eating plan may help some people who have irritable bowel syndrome (IBS) and certain other bowel (intestinal) diseases to manage their symptoms. This meal plan can be complicated to follow. Work with a diet and nutrition specialist (dietitian) to make a low-FODMAP eating plan that is right for you. A dietitian can help make sure that you get enough nutrition from this diet. What are tips for following this plan? Reading food labels Check labels for hidden FODMAPs such as: High-fructose syrup. Honey. Agave. Natural fruit flavors. Onion or garlic powder. Choose low-FODMAP foods that contain 3-4 grams of fiber per serving. Check food labels for serving sizes. Eat only one serving at a time to make sure FODMAP levels stay low. Shopping Shop with a list of foods that are recommended on this diet and make a meal plan. Meal planning Follow a low-FODMAP eating plan for up to 6 weeks, or as told by your health care provider or dietitian. To follow the eating plan: Eliminate high-FODMAP foods from your diet completely. Choose only low-FODMAP foods to eat. You will do this for 2-6 weeks. Gradually reintroduce high-FODMAP foods into your diet one at a time. Most people should wait a few days before introducing the next new high-FODMAP food into their meal plan. Your dietitian can recommend how quickly you may reintroduce foods. Keep a daily record of what and how much you eat and drink. Make note of any symptoms that you have after eating. Review your daily record with a dietitian regularly to identify which foods you can eat and which foods you should avoid. General tips Drink enough fluid each day to keep your urine pale yellow. Avoid processed foods. These often have added sugar and may be high in FODMAPs. Avoid  most dairy products, whole grains, and sweeteners. Work with a dietitian to make sure you get enough fiber in your diet. Avoid high FODMAP foods at meals to manage symptoms. Recommended foods Fruits Bananas, oranges, tangerines, lemons, limes, blueberries, raspberries, strawberries, grapes, cantaloupe, honeydew melon, kiwi, papaya, passion fruit, and pineapple. Limited amounts of dried cranberries, banana chips, and shredded coconut. Vegetables Eggplant, zucchini, cucumber, peppers, green beans, [place] sprouts, lettuce, arugula, kale, Swiss chard, spinach, collard greens, bok choy, summer squash, potato, and tomato. Limited amounts of corn, carrot, and sweet potato. Green parts of scallions. Grains Gluten-free grains, such as rice, oats, buckwheat, quinoa, corn, polenta, and millet. Gluten-free pasta, bread, or cereal. Rice noodles. Corn tortillas. Meats and other proteins Unseasoned beef, pork, poultry, or fish. Eggs. Bacon. Tofu (firm) and tempeh. Limited amounts of nuts and seeds, such as almonds, walnuts, brazil nuts, pecans, peanuts, nut butters, pumpkin seeds, chia seeds, and sunflower seeds. Dairy Lactose-free milk, yogurt, and kefir. Lactose-free cottage cheese and ice cream. Non-dairy milks, such as almond, coconut, hemp, and rice milk. Non-dairy yogurt. Limited amounts of goat cheese, brie, mozzarella, parmesan, swiss, and other hard cheeses. Fats and oils Butter-free spreads. Vegetable oils, such as olive, canola, and sunflower oil. Seasoning and other foods Artificial sweeteners with names that do not end in "ol," such as aspartame, saccharine, and stevia. Maple syrup, white table sugar, raw sugar, brown sugar, and molasses. Mayonnaise, soy sauce, and tamari. Fresh basil, coriander, parsley, rosemary, and thyme. Beverages Water and mineral water. Sugar-sweetened soft drinks. Small amounts of orange juice or cranberry juice. Black and green tea. Most dry wines.   Coffee. The items listed  above may not be a complete list of foods and beverages you can eat. Contact a dietitian for more information. Foods to avoid Fruits Fresh, dried, and juiced forms of apple, pear, watermelon, peach, plum, cherries, apricots, blackberries, boysenberries, figs, nectarines, and mango. Avocado. Vegetables Chicory root, artichoke, asparagus, cabbage, snow peas, Brussels sprouts, broccoli, sugar snap peas, mushrooms, celery, and cauliflower. Onions, garlic, leeks, and the white part of scallions. Grains Wheat, including kamut, durum, and semolina. Barley and bulgur. Couscous. Wheat-based cereals. Wheat noodles, bread, crackers, and pastries. Meats and other proteins Fried or fatty meat. Sausage. Cashews and pistachios. Soybeans, baked beans, black beans, chickpeas, kidney beans, fava beans, navy beans, lentils, black-eyed peas, and split peas. Dairy Milk, yogurt, ice cream, and soft cheese. Cream and sour cream. Milk-based sauces. Custard. Buttermilk. Soy milk. Seasoning and other foods Any sugar-free gum or candy. Foods that contain artificial sweeteners such as sorbitol, mannitol, isomalt, or xylitol. Foods that contain honey, high-fructose corn syrup, or agave. Bouillon, vegetable stock, beef stock, and chicken stock. Garlic and onion powder. Condiments made with onion, such as hummus, chutney, pickles, relish, salad dressing, and salsa. Tomato paste. Beverages Chicory-based drinks. Coffee substitutes. Chamomile tea. Fennel tea. Sweet or fortified wines such as port or sherry. Diet soft drinks made with isomalt, mannitol, maltitol, sorbitol, or xylitol. Apple, pear, and mango juice. Juices with high-fructose corn syrup. The items listed above may not be a complete list of foods and beverages you should avoid. Contact a dietitian for more information. Summary FODMAP stands for fermentable oligosaccharides, disaccharides, monosaccharides, and polyols. These are sugars that are hard for some people to  digest. A low-FODMAP eating plan is a short-term diet that helps to ease symptoms of certain bowel diseases. The eating plan usually lasts up to 6 weeks. After that, high-FODMAP foods are reintroduced gradually and one at a time. This can help you find out which foods may be causing symptoms. A low-FODMAP eating plan can be complicated. It is best to work with a dietitian who has experience with this type of plan. This information is not intended to replace advice given to you by your health care provider. Make sure you discuss any questions you have with your health care provider. Document Revised: 02/12/2020 Document Reviewed: 02/12/2020 Elsevier Patient Education  2023 [employer]  

## 2022-07-21 NOTE — Assessment & Plan Note (Signed)
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  She continues on prednisone with good tolerance and will continue this.  Specific recommendations for her to stay on the prednisone.  We reviewed her labs in detail which indicate a hemoglobin of 8.2.  She is undergoing lab checks at the skilled nursing facility and I gave her son my card so that we could ask if they will fax their lab results.  Since we can get her lab results faxed to [place] she will see Dr. Chryl Heck in 2 weeks for labs and follow-up.  This will coincide well with the time after she is discharged from the skilled facility.  In regards to her loose bowel movements I gave her a handout on a low FODMAP diet and she is going to review this with the dietitian at the facility where she is staying.  She has very complementary things to say about him so I am hopeful that they will be able to work out a plan and perhaps this will work in decreasing her loose stool.  [name] will return in 2 weeks for labs and follow-up.  They know to call for any questions or concerns in the meantime.

## 2022-07-24 DIAGNOSIS — D591 Autoimmune hemolytic anemia, unspecified: Secondary | ICD-10-CM | POA: Diagnosis not present

## 2022-07-24 DIAGNOSIS — D500 Iron deficiency anemia secondary to blood loss (chronic): Secondary | ICD-10-CM | POA: Diagnosis not present

## 2022-07-24 DIAGNOSIS — N184 Chronic kidney disease, stage 4 (severe): Secondary | ICD-10-CM | POA: Diagnosis not present

## 2022-07-24 DIAGNOSIS — E8721 Acute metabolic acidosis: Secondary | ICD-10-CM | POA: Diagnosis not present

## 2022-07-24 DIAGNOSIS — K922 Gastrointestinal hemorrhage, unspecified: Secondary | ICD-10-CM | POA: Diagnosis not present

## 2022-07-24 DIAGNOSIS — K5740 Diverticulitis of both small and large intestine with perforation and abscess without bleeding: Secondary | ICD-10-CM | POA: Diagnosis not present

## 2022-07-24 DIAGNOSIS — I5032 Chronic diastolic (congestive) heart failure: Secondary | ICD-10-CM | POA: Diagnosis not present

## 2022-07-24 DIAGNOSIS — M6281 Muscle weakness (generalized): Secondary | ICD-10-CM | POA: Diagnosis not present

## 2022-07-24 DIAGNOSIS — R600 Localized edema: Secondary | ICD-10-CM | POA: Diagnosis not present

## 2022-07-24 LAB — SAMPLE TO BLOOD BANK

## 2022-07-24 NOTE — Chronic Care Management (AMB) (Signed)
  Care Coordination Note  07/24/2022 Name: [name] MRN: [id number] DOB: 10/12/1940  [name] is a 81 y.o. year old female who is a primary care patient of [name], [name], MD and is actively engaged with the care management team. I reached out to [name] by phone today to assist with re-scheduling an initial visit with the BSW  Follow up plan: Unsuccessful telephone outreach attempt made. A HIPAA compliant phone message was left for the patient providing contact information and requesting a return call.   [place]  Direct Dial: [phone]

## 2022-07-25 DIAGNOSIS — N184 Chronic kidney disease, stage 4 (severe): Secondary | ICD-10-CM | POA: Diagnosis not present

## 2022-07-25 DIAGNOSIS — R600 Localized edema: Secondary | ICD-10-CM | POA: Diagnosis not present

## 2022-07-25 DIAGNOSIS — D649 Anemia, unspecified: Secondary | ICD-10-CM | POA: Diagnosis not present

## 2022-07-25 DIAGNOSIS — F419 Anxiety disorder, unspecified: Secondary | ICD-10-CM | POA: Diagnosis not present

## 2022-07-25 DIAGNOSIS — S41101A Unspecified open wound of right upper arm, initial encounter: Secondary | ICD-10-CM | POA: Diagnosis not present

## 2022-07-25 DIAGNOSIS — I700 Atherosclerosis of aorta: Secondary | ICD-10-CM | POA: Diagnosis not present

## 2022-07-25 DIAGNOSIS — I5032 Chronic diastolic (congestive) heart failure: Secondary | ICD-10-CM | POA: Diagnosis not present

## 2022-07-25 DIAGNOSIS — I779 Disorder of arteries and arterioles, unspecified: Secondary | ICD-10-CM | POA: Diagnosis not present

## 2022-07-25 DIAGNOSIS — K922 Gastrointestinal hemorrhage, unspecified: Secondary | ICD-10-CM | POA: Diagnosis not present

## 2022-07-25 DIAGNOSIS — D638 Anemia in other chronic diseases classified elsewhere: Secondary | ICD-10-CM | POA: Diagnosis not present

## 2022-07-26 ENCOUNTER — Emergency Department (HOSPITAL_COMMUNITY): Payer: Medicare HMO

## 2022-07-26 ENCOUNTER — Encounter (HOSPITAL_COMMUNITY): Payer: Self-pay

## 2022-07-26 ENCOUNTER — Emergency Department (HOSPITAL_COMMUNITY)
Admission: EM | Admit: 2022-07-26 | Discharge: 2022-07-26 | Disposition: A | Discharge status: Home / Self Care | Payer: Medicare HMO | Attending: Emergency Medicine | Admitting: Emergency Medicine

## 2022-07-26 ENCOUNTER — Other Ambulatory Visit: Payer: Self-pay

## 2022-07-26 DIAGNOSIS — N189 Chronic kidney disease, unspecified: Secondary | ICD-10-CM | POA: Insufficient documentation

## 2022-07-26 DIAGNOSIS — I509 Heart failure, unspecified: Secondary | ICD-10-CM | POA: Insufficient documentation

## 2022-07-26 DIAGNOSIS — Z794 Long term (current) use of insulin: Secondary | ICD-10-CM | POA: Diagnosis not present

## 2022-07-26 DIAGNOSIS — S0101XA Laceration without foreign body of scalp, initial encounter: Secondary | ICD-10-CM | POA: Diagnosis not present

## 2022-07-26 DIAGNOSIS — W19XXXA Unspecified fall, initial encounter: Secondary | ICD-10-CM

## 2022-07-26 DIAGNOSIS — E119 Type 2 diabetes mellitus without complications: Secondary | ICD-10-CM | POA: Insufficient documentation

## 2022-07-26 DIAGNOSIS — M1611 Unilateral primary osteoarthritis, right hip: Secondary | ICD-10-CM | POA: Diagnosis not present

## 2022-07-26 DIAGNOSIS — S51011A Laceration without foreign body of right elbow, initial encounter: Secondary | ICD-10-CM | POA: Diagnosis not present

## 2022-07-26 DIAGNOSIS — W050XXA Fall from non-moving wheelchair, initial encounter: Secondary | ICD-10-CM | POA: Insufficient documentation

## 2022-07-26 DIAGNOSIS — Z7901 Long term (current) use of anticoagulants: Secondary | ICD-10-CM | POA: Diagnosis not present

## 2022-07-26 DIAGNOSIS — J8410 Pulmonary fibrosis, unspecified: Secondary | ICD-10-CM | POA: Diagnosis not present

## 2022-07-26 DIAGNOSIS — R791 Abnormal coagulation profile: Secondary | ICD-10-CM | POA: Diagnosis not present

## 2022-07-26 DIAGNOSIS — M25521 Pain in right elbow: Secondary | ICD-10-CM | POA: Diagnosis not present

## 2022-07-26 DIAGNOSIS — S0990XA Unspecified injury of head, initial encounter: Secondary | ICD-10-CM | POA: Diagnosis present

## 2022-07-26 DIAGNOSIS — S0003XA Contusion of scalp, initial encounter: Secondary | ICD-10-CM | POA: Diagnosis not present

## 2022-07-26 DIAGNOSIS — S51019A Laceration without foreign body of unspecified elbow, initial encounter: Secondary | ICD-10-CM

## 2022-07-26 DIAGNOSIS — Z043 Encounter for examination and observation following other accident: Secondary | ICD-10-CM | POA: Diagnosis not present

## 2022-07-26 DIAGNOSIS — S199XXA Unspecified injury of neck, initial encounter: Secondary | ICD-10-CM | POA: Diagnosis not present

## 2022-07-26 LAB — BASIC METABOLIC PANEL
Anion gap: 10 (ref 5–15)
BUN: 41 mg/dL — ABNORMAL HIGH (ref 8–23)
CO2: 20 mmol/L — ABNORMAL LOW (ref 22–32)
Calcium: 8.3 mg/dL — ABNORMAL LOW (ref 8.9–10.3)
Chloride: 104 mmol/L (ref 98–111)
Creatinine, Ser: 1.97 mg/dL — ABNORMAL HIGH (ref 0.44–1.00)
GFR, Estimated: 25 mL/min — ABNORMAL LOW (ref >60)
Glucose, Bld: 227 mg/dL — ABNORMAL HIGH (ref 70–99)
Potassium: 4.1 mmol/L (ref 3.5–5.1)
Sodium: 134 mmol/L — ABNORMAL LOW (ref 135–145)

## 2022-07-26 LAB — CBC WITH DIFFERENTIAL/PLATELET
Abs Immature Granulocytes: 0.04 10*3/uL (ref 0.00–0.07)
Basophils Absolute: 0 10*3/uL (ref 0.0–0.1)
Basophils Relative: 0 %
Eosinophils Absolute: 0 10*3/uL (ref 0.0–0.5)
Eosinophils Relative: 0 %
HCT: 27.7 % — ABNORMAL LOW (ref 36.0–46.0)
Hemoglobin: 9.3 g/dL — ABNORMAL LOW (ref 12.0–15.0)
Immature Granulocytes: 2 %
Lymphocytes Relative: 11 %
Lymphs Abs: 0.3 10*3/uL — ABNORMAL LOW (ref 0.7–4.0)
MCH: 31.6 pg (ref 26.0–34.0)
MCHC: 33.6 g/dL (ref 30.0–36.0)
MCV: 94.2 fL (ref 80.0–100.0)
Monocytes Absolute: 0.2 10*3/uL (ref 0.1–1.0)
Monocytes Relative: 6 %
Neutro Abs: 2.2 10*3/uL (ref 1.7–7.7)
Neutrophils Relative %: 81 %
Platelets: 112 10*3/uL — ABNORMAL LOW (ref 150–400)
RBC: 2.94 MIL/uL — ABNORMAL LOW (ref 3.87–5.11)
RDW: 18.2 % — ABNORMAL HIGH (ref 11.5–15.5)
WBC: 2.7 10*3/uL — ABNORMAL LOW (ref 4.0–10.5)
nRBC: 0 % (ref 0.0–0.2)

## 2022-07-26 LAB — PROTIME-INR
INR: 6 (ref 0.8–1.2)
Prothrombin Time: 53.4 seconds — ABNORMAL HIGH (ref 11.4–15.2)

## 2022-07-26 MED ORDER — LIDOCAINE-EPINEPHRINE (PF) 2 %-1:200000 IJ SOLN
20.0000 mL | Freq: Once | INTRAMUSCULAR | Status: AC
  Administered 2022-07-26: 20 mL
  Filled 2022-07-26: qty 20

## 2022-07-26 NOTE — ED Notes (Signed)
Patient transported to X-ray 

## 2022-07-26 NOTE — ED Provider Notes (Signed)
[facility] EMERGENCY DEPARTMENT Provider Note   CSN: [id number] Arrival date & time: 07/26/22  1313     History  Chief Complaint  Patient presents with   [name] is a 81 y.o. female.  Patient with recent admission to the hospital and discharge on 07/14/2022 for GI bleeding, history of valve disease status post mechanical aortic valve replacement on Coumadin, history of congestive heart failure, diabetes, paroxysmal A-fib, chronic kidney disease, ANCA vasculitis on prednisone, currently short-term resident at rehab facility --presents to the emergency department today after a fall.  Patient states that she was using the bathroom and forgot to lock her wheelchair.  When she went to sit back down we will chair slid and she fell backwards striking the back of her head.  She was down on the floor for a few minutes before receiving assistance.  She was bleeding from an occipital scalp laceration.  This was bandaged.  No c-collar was placed.  Patient complained of posterior headache.  Otherwise denies preceding chest pain, lightheadedness or dizziness.  No abdominal pain or hip pain.  She is able to move her extremities well.  No reported loss of consciousness, confusion, vomiting.       Home Medications Prior to Admission medications   Medication Sig Start Date End Date Taking? Authorizing Provider  acetaminophen (TYLENOL) 325 MG tablet Take 325 mg by mouth every 6 (six) hours as needed for mild pain or fever.   Yes [provider]  alendronate (FOSAMAX) 70 MG tablet Take 70 mg by mouth once a week. Saturdays 12/06/21  Yes [provider]  allopurinol (ZYLOPRIM) 100 MG tablet Take 100 mg by mouth daily.   Yes [provider]  ALPRAZolam (XANAX) 0.5 MG tablet Take 1 tablet (0.5 mg total) by mouth at bedtime. 07/14/22  Yes Sheikh, Omair Latif, DO  amLODipine (NORVASC) 10 MG tablet TAKE 1 TABLET EVERY DAY Patient taking differently:  Take 10 mg by mouth daily. 05/17/22  Yes [name], MD  atorvastatin (LIPITOR) 80 MG tablet Take 1 tablet (80 mg total) by mouth daily at 6 PM. Patient taking differently: Take 80 mg by mouth daily. 11/22/18  Yes [name], MD  Cholecalciferol (VITAMIN D3) 50 MCG (2000 UT) capsule Take 2,000 Units by mouth daily with lunch. 11/26/18  Yes [provider]  cholestyramine Lucrezia Starch) 4 GM/DOSE powder Take 4 g by mouth daily after supper. 06/07/22  Yes [provider]  cloNIDine (CATAPRES) 0.2 MG tablet TAKE 1 TABLET TWICE DAILY Patient taking differently: Take 0.2 mg by mouth 2 (two) times daily. 06/22/21  Yes [name], MD  Cyanocobalamin (VITAMIN B12) 1000 MCG TBCR Take 1,000 mcg by mouth daily with lunch.   Yes [provider]  estradiol (ESTRACE) 0.1 MG/GM vaginal cream Place 0.5g nightly for two weeks then twice a week after Patient taking differently: Place 1 Applicatorful vaginally See admin instructions. Place 0.5g nightly for two weeks then twice a week afterwards. 09/19/21  Yes Jaquita Folds, MD  famotidine (PEPCID) 20 MG tablet Take 1 tablet (20 mg total) by mouth 2 (two) times daily. 04/10/22  Yes Benay Pike, MD  folic acid (FOLVITE) 1 MG tablet Take 1 mg by mouth daily.   Yes [provider]  furosemide (LASIX) 20 MG tablet Take 60-100 mg by mouth See admin instructions. Two entries on MAR for furosemide '20mg'$ . Take 3 tablets ('60mg'$ ) by mouth every evening. Take 5 tablets ('100mg'$ )  once a day. 07/14/22  Yes [provider]  hydrALAZINE (APRESOLINE) 100 MG tablet TAKE 1 TABLET THREE TIMES DAILY Patient taking differently: Take 100 mg by mouth 3 (three) times daily. 02/17/22  Yes [name], MD  HYDROmorphone HCl (DILAUDID) 1 MG/ML LIQD Take 1-2 mLs (1-2 mg total) by mouth every 6 (six) hours as needed for severe pain or moderate pain (use oral medication prior to giving IV). Patient taking differently: Take 1 mL by mouth  every 6 (six) hours as needed for severe pain or moderate pain (use oral medication prior to giving IV). 07/14/22  Yes Sheikh, Omair Latif, DO  insulin aspart (NOVOLOG) 100 UNIT/ML injection Take 2 units for blood sugar 200-250, 4 units for blood sugar 252-300, 6 units for blood sugar 301-350, 8 units for blood sugar greater than 350 and call MD. Patient taking differently: Inject 2-8 Units into the skin 3 (three) times daily with meals. Per sliding scale, If blood sugar is 200 to 250, give 2 units. If blood sugar is 251 to 300, give 4 units. If blood sugar is 301 to 350, give 6 units. If blood sugar is greater than 350, give 8 units and call MD. 02/23/22  Yes Causey, Charlestine Massed, NP  insulin glargine-yfgn (SEMGLEE) 100 UNIT/ML injection Inject 0.1 mLs (10 Units total) into the skin daily. Patient taking differently: Inject 0.1 mLs into the skin daily. 07/15/22  Yes Sheikh, Omair Latif, DO  lidocaine (XYLOCAINE) 2 % solution Use as directed 15 mLs in the mouth or throat as needed for mouth pain. Patient taking differently: Use as directed 15 mLs in the mouth or throat 2 (two) times daily as needed for mouth pain. 01/30/22  Yes Benay Pike, MD  loperamide (IMODIUM A-D) 2 MG tablet Take 2 mg by mouth daily.   Yes [provider]  Menthol, Topical Analgesic, (BIOFREEZE) 4 % GEL Apply 1 Application topically 3 (three) times daily. 1 application topically to lower back and BLE   Yes [provider]  metoprolol succinate (TOPROL-XL) 25 MG 24 hr tablet TAKE 1/2 TABLET EVERY DAY Patient taking differently: Take 12.5 mg by mouth daily. 07/19/21  Yes [name], MD  mirabegron ER (MYRBETRIQ) 25 MG TB24 tablet Take 1 tablet (25 mg total) by mouth daily. 04/17/22  Yes Daleen Bo, MD  nystatin-alum & mag hydroxide-simeth-diphenhydrAMINE Swish 5 mLs by mouth 3 times daily then spit 05/15/22  Yes Walisiewicz, Kaitlyn E, PA-C  ondansetron (ZOFRAN-ODT) 4 MG disintegrating tablet Take 4  mg by mouth every 6 (six) hours as needed for nausea. 07/14/22  Yes [provider]  pantoprazole (PROTONIX) 40 MG tablet Take 40 mg by mouth 2 (two) times daily. 06/25/22  Yes [provider]  predniSONE (DELTASONE) 20 MG tablet Take 20 mg by mouth daily. 07/17/22  Yes [provider]  saccharomyces boulardii (FLORASTOR) 250 MG capsule Take 250 mg by mouth 2 (two) times daily.   Yes [provider]  spironolactone (ALDACTONE) 25 MG tablet TAKE 1 TABLET EVERY DAY Patient taking differently: Take 25 mg by mouth daily. 05/17/22  Yes [name], MD  tadalafil, PAH, (ADCIRCA) 20 MG tablet Take 2 tablets (40 mg total) by mouth daily. Patient taking differently: Take 40 mg by mouth at bedtime. 12/06/21  Yes [name], MD  vancomycin (VANCOCIN) 125 MG capsule Take 1 capsule (125 mg total) by mouth 2 (two) times daily for 14 days. 07/14/22 07/28/22 Yes Sheikh, Ramos, DO  vitamin C (  ASCORBIC ACID) 500 MG tablet Take 500 mg by mouth daily with lunch.   Yes [provider]  fluconazole (DIFLUCAN) 100 MG tablet Take 1 tablet (100 mg total) by mouth daily. Patient not taking: Reported on 07/26/2022 07/15/22   Raiford Noble Latif, DO  furosemide (LASIX) 40 MG tablet TAKE 2 TABLETS (80 MG TOTAL) IN MORNING AND 1 AND 1/2 TABLETS (60 MG TOTAL) IN EVENING. CHANGE IN DOSAGE. Patient not taking: Reported on 07/26/2022 03/22/22   [name] D, NP  Insulin Syringe-Needle U-100 (B-D INS SYR ULTRAFINE .3CC/29G) 29G X 1/2" 0.3 ML MISC Use for sliding scale for breakthrough high blood sugar readings 02/24/22   Iruku, Arletha Pili, MD  ondansetron (ZOFRAN) 4 MG tablet Take 1 tablet (4 mg total) by mouth every 6 (six) hours as needed for nausea. Patient not taking: Reported on 07/26/2022 07/14/22   Raiford Noble Latif, DO  predniSONE (DELTASONE) 5 MG tablet Take 3 tablets (15 mg total) by mouth daily with breakfast for 2 days, THEN 2 tablets (10 mg total) daily with breakfast  for 5 days, THEN 1 tablet (5 mg total) daily with breakfast for 5 days. Patient not taking: Reported on 07/26/2022 07/15/22 07/27/22  Raiford Noble Latif, DO  simethicone (GAS-X) 80 MG chewable tablet Chew 1 tablet (80 mg total) by mouth 4 (four) times daily as needed for flatulence. Patient not taking: Reported on 07/26/2022 04/16/22 04/16/23  Edwin Dada, MD  warfarin (COUMADIN) 5 MG tablet TAKE 1 TO 1 AND 1/2 TABLETS DAILY AS DIRECTED BY COUMADIN CLINIC Patient not taking: Reported on 07/26/2022 07/17/22   Lorretta Harp, MD      Allergies    Flagyl [metronidazole], Augmentin [amoxicillin-pot clavulanate], Ciprofloxacin, Coreg [carvedilol], Diflucan [fluconazole], Losartan, Privigen [immune globulin (human)], Sulfamethoxazole-trimethoprim, Verapamil, Zetia [ezetimibe], Zocor [simvastatin - high dose], and Gatifloxacin    Review of Systems   Review of Systems  Physical Exam Updated Vital Signs BP (!) 142/48 (BP Location: Left Arm)   Pulse 73   Temp (!) 97.4 F (36.3 C) (Oral)   Resp 19   Ht 5' (1.524 m)   Wt 68 kg   SpO2 98%   BMI 29.29 kg/m   Physical Exam Vitals and nursing note reviewed.  Constitutional:      Appearance: She is well-developed.  HENT:     Head: Normocephalic. No raccoon eyes or Battle's sign.     Comments: 1 cm occipital scalp laceration, right occipital scalp, minor venous oozing after clot removed with skin cleanser.  There is underlying scalp laceration.     Right Ear: Tympanic membrane, ear canal and external ear normal. No hemotympanum.     Left Ear: Tympanic membrane, ear canal and external ear normal. No hemotympanum.     Nose: Nose normal.     Mouth/Throat:     Pharynx: Uvula midline.  Eyes:     General: Lids are normal.     Extraocular Movements:     Right eye: No nystagmus.     Left eye: No nystagmus.     Conjunctiva/sclera: Conjunctivae normal.     Pupils: Pupils are equal, round, and reactive to light.     Comments: No visible  hyphema noted  Cardiovascular:     Rate and Rhythm: Normal rate and regular rhythm.  Pulmonary:     Effort: Pulmonary effort is normal.     Breath sounds: Normal breath sounds.  Abdominal:     Palpations: Abdomen is soft.     Tenderness: There  is no abdominal tenderness.  Musculoskeletal:     Right shoulder: No bony tenderness. Normal range of motion.     Left shoulder: No bony tenderness. Normal range of motion.     Right elbow: Normal range of motion. No tenderness.     Left elbow: Normal range of motion. No tenderness.     Cervical back: Normal range of motion and neck supple. No tenderness or bony tenderness.     Thoracic back: No tenderness or bony tenderness.     Lumbar back: Tenderness present. No bony tenderness.     Right hip: No tenderness or bony tenderness. Normal range of motion.     Left hip: No tenderness or bony tenderness. Normal range of motion.     Right knee: Normal range of motion.     Left knee: Normal range of motion.     Comments: Mild tenderness over the sacral area without deformity.  Full motion of the hips without tenderness.  There is a skin tear that was bandaged over the right elbow. This is superficial and about 3cm in length. No active bleeding.   Skin:    General: Skin is warm and dry.  Neurological:     Mental Status: She is alert and oriented to person, place, and time.     GCS: GCS eye subscore is 4. GCS verbal subscore is 5. GCS motor subscore is 6.     Cranial Nerves: No cranial nerve deficit.     Sensory: No sensory deficit.     Coordination: Coordination normal.     ED Results / Procedures / Treatments   Labs (all labs ordered are listed, but only abnormal results are displayed) Labs Reviewed  CBC WITH DIFFERENTIAL/PLATELET - Abnormal; Notable for the following components:      Result Value   WBC 2.7 (*)    RBC 2.94 (*)    Hemoglobin 9.3 (*)    HCT 27.7 (*)    RDW 18.2 (*)    Platelets 112 (*)    Lymphs Abs 0.3 (*)    All other  components within normal limits  BASIC METABOLIC PANEL - Abnormal; Notable for the following components:   Sodium 134 (*)    CO2 20 (*)    Glucose, Bld 227 (*)    BUN 41 (*)    Creatinine, Ser 1.97 (*)    Calcium 8.3 (*)    GFR, Estimated 25 (*)    All other components within normal limits  PROTIME-INR - Abnormal; Notable for the following components:   Prothrombin Time 53.4 (*)    INR 6.0 (*)    All other components within normal limits    EKG None  Radiology DG Pelvis Portable  Result Date: 07/26/2022 CLINICAL DATA:  Unwitnessed fall shortly before imaging. EXAM: PORTABLE PELVIS 1-2 VIEWS COMPARISON:  CT pelvis 07/08/2022 FINDINGS: Reduced sensitivity due to body habitus and portable technique. Stable severe degenerative hip arthropathy on the right. Poor visualization of the sacrum related to technique and overlying bowel gas. No definite fracture identified. Vascular calcifications noted. IMPRESSION: 1. No fracture identified. Reduced sensitivity due to body habitus and portable technique. There is a high clinical index of suspicion of occult fracture, dedicated CT pelvis would be recommended. 2. Severe degenerative hip arthropathy on the right. 3. Atherosclerosis. Electronically Signed   By: Van Clines M.D.   On: 07/26/2022 13:56   DG Chest Port 1 View  Result Date: 07/26/2022 CLINICAL DATA:  Unwitnessed fall. EXAM: PORTABLE CHEST 1  VIEW COMPARISON:  June 22, 2022. FINDINGS: Stable cardiomegaly. Status post coronary bypass graft. Minimal bibasilar subsegmental atelectasis or scarring is noted. Bony thorax is unremarkable. Calcified granuloma seen in right midlung. IMPRESSION: Minimal bibasilar subsegmental atelectasis or scarring. Aortic Atherosclerosis (ICD10-I70.0). Electronically Signed   By: Marijo Conception M.D.   On: 07/26/2022 13:55    Procedures .Marland KitchenLaceration Repair  Date/Time: 07/26/2022 3:49 PM  Performed by: Carlisle Cater, PA-C Authorized by: Carlisle Cater, PA-C   Consent:    Consent obtained:  Verbal   Consent given by:  Patient   Risks discussed:  Pain and infection   Alternatives discussed:  No treatment Universal protocol:    Patient identity confirmed:  Verbally with patient and provided demographic data Anesthesia:    Anesthesia method:  Local infiltration   Local anesthetic:  Lidocaine 2% WITH epi Laceration details:    Location:  Scalp   Scalp location:  Occipital   Length (cm):  1 Pre-procedure details:    Preparation:  Patient was prepped and draped in usual sterile fashion and imaging obtained to evaluate for foreign bodies Exploration:    Imaging obtained comment:  CT   Imaging outcome: foreign body not noted     Wound exploration: wound explored through full range of motion and entire depth of wound visualized     Wound extent: no foreign bodies/material noted   Treatment:    Area cleansed with:  Shur-Clens   Amount of cleaning:  Extensive Skin repair:    Repair method:  Sutures   Suture size:  4-0   Suture material:  Nylon   Suture technique:  Figure eight   Number of sutures:  1 Approximation:    Approximation:  Close Repair type:    Repair type:  Simple Post-procedure details:    Dressing:  Open (no dressing)   Procedure completion:  Tolerated well, no immediate complications     Medications Ordered in ED Medications  lidocaine-EPINEPHrine (XYLOCAINE W/EPI) 2 %-1:200000 (PF) injection 20 mL (has no administration in time range)    ED Course/ Medical Decision Making/ A&P    Patient seen and examined.  Patient presented as a level 2 trauma.  History obtained directly from patient, recent hospitalization discharge notes as well as EMS report.  Labs/EKG: Ordered CBC, CMP, INR.  Imaging: Ordered CT head, cervical spine.  X-ray of the chest and pelvis.  Medications/Fluids: None ordered  Most recent vital signs reviewed and are as follows: BP (!) 142/48 (BP Location: Left Arm)   Pulse 73    Temp (!) 97.4 F (36.3 C) (Oral)   Resp 19   Ht 5' (1.524 m)   Wt 68 kg   SpO2 98%   BMI 29.29 kg/m   Initial impression: Head injury on anticoagulation, fall suspected mechanical.  Also scalp laceration which will need repaired.  3:50 PM Reassessment performed. Patient appears well.  She tolerated wound repair.  Labs personally reviewed and interpreted including: CBC with WBC 2.7, hemoglobin 9.3 up from 8.2 at previous discharge; BMP glucose 227, creatinine 1.97 which is the patient's baseline; INR supratherapeutic at 6.0.  Imaging personally visualized and interpreted including: CT of the head and cervical spine, agree negative.  X-ray of the chest and pelvis, agree no fractures.  Reviewed pertinent lab work and imaging with patient at bedside. Questions answered.   Also discussed patient with Dr. Maylon Peppers.  Plan in regards to supratherapeutic INR is for patient to hold Coumadin tonight and tomorrow night.  She will  need to have your INR rechecked at that time.   Most current vital signs reviewed and are as follows: BP (!) 146/56   Pulse 82   Temp (!) 97.4 F (36.3 C) (Oral)   Resp 19   Ht 5' (1.524 m)   Wt 68 kg   SpO2 98%   BMI 29.29 kg/m   Plan: Patient had increased pain with ranging of her left elbow and will be sent back to x-ray to ensure no signs of fracture.  At that time, patient will likely be discharged home.  I had a long discussion with patient and family regarding head injury precautions.  If patient develops any confusion, severe headache, vomiting, difficulty ambulating at baseline --she is to return from her facility back to the emergency department for repeat imaging.  They are in agreement this plan and are comfortable with this.  Family will likely take her back to her facility.  Prior to discharge, patient will be given pressure bandage of her scalp to help control any additional bleeding and the skin tear on her right elbow area will be  dressed.  Signout to [employer] at shift change pending follow-up on x-ray and discharge.                            Medical Decision Making Amount and/or Complexity of Data Reviewed Labs: ordered. Radiology: ordered.  Risk Prescription drug management.   Patient with mechanical sounding fall while at her facility today.  She forgot to lock her wheelchair and fell to the ground hitting the back of her head.  She is on Coumadin.  Presented as a level 2 trauma.  Imaging of the head and neck were negative.  Imaging of the chest and pelvis were negative.  Her hemoglobin is improved from recent hospitalization for GI bleed which appears to be controlled.  Otherwise she seems to be at baseline.  [name] vital signs, pertinent lab work and imaging were reviewed and interpreted as discussed in the ED course. Hospitalization was considered for further testing, treatments, or serial exams/observation. However as patient is well-appearing, has a stable exam, and reassuring studies today, I do not feel that they warrant admission at this time. This plan was discussed with the patient who verbalizes agreement and comfort with this plan and seems reliable and able to return to the Emergency Department with worsening or changing symptoms.          Final Clinical Impression(s) / ED Diagnoses Final diagnoses:  Fall, initial encounter  Laceration of scalp, initial encounter  Hematoma of scalp, initial encounter  Skin tear of elbow without complication, initial encounter  Supratherapeutic INR    Rx / DC Orders ED Discharge Orders     None         Carlisle Cater, PA-C 07/26/22 [place], [place], DO 07/29/22 1454

## 2022-07-26 NOTE — ED Notes (Signed)
dressing applied to head per [place], [place], [place]

## 2022-07-26 NOTE — ED Provider Notes (Signed)
Physical Exam  BP (!) 146/56   Pulse 82   Temp (!) 97.4 F (36.3 C) (Oral)   Resp 19   Ht 5' (1.524 m)   Wt 68 kg   SpO2 98%   BMI 29.29 kg/m   Physical Exam  Procedures  Procedures  ED Course / MDM    Medical Decision Making Amount and/or Complexity of Data Reviewed Labs: ordered. Radiology: ordered.  Risk Prescription drug management.   Accepted handoff at shift change from [facility], [place]. Please see prior provider note for more detail.   Briefly: Patient is 81 y.o. F presenting here after a fall  DDX: concern for right elbow pain vs fracture  Plan: Follow up with XR imaging.   XR imaging does not show any fracture or dislocation. Will have patient hold two doses of Warfarin for her supratheraputic INR level.  Discussed strict return precautions and red flag symptoms with patient family at bedside.  Patient verbalizes understanding agrees the plan.  Patient is stable being discharged home in good condition.  DG Elbow Complete Right  Result Date: 07/26/2022 CLINICAL DATA:  Fall, elbow pain EXAM: RIGHT ELBOW - COMPLETE 5 VIEW COMPARISON:  None Available. FINDINGS: Normal alignment no fracture.  No joint effusion. Diffuse arterial calcification. Laceration dorsal to the proximal ulna. IMPRESSION: Negative for fracture Electronically Signed   By: Franchot Gallo M.D.   On: 07/26/2022 16:00   CT Head Wo Contrast  Result Date: 07/26/2022 CLINICAL DATA:  Head trauma, minor (Age >= 65y); Neck trauma (Age >= 65y) EXAM: CT HEAD WITHOUT CONTRAST CT CERVICAL SPINE WITHOUT CONTRAST TECHNIQUE: Multidetector CT imaging of the head and cervical spine was performed following the standard protocol without intravenous contrast. Multiplanar CT image reconstructions of the cervical spine were also generated. RADIATION DOSE REDUCTION: This exam was performed according to the departmental dose-optimization program which includes automated exposure control, adjustment of the mA and/or kV  according to patient size and/or use of iterative reconstruction technique. COMPARISON:  None Available. FINDINGS: CT HEAD FINDINGS Brain: No evidence of acute infarction, hemorrhage, hydrocephalus, extra-axial collection or mass lesion/mass effect. Vascular: Calcific atherosclerosis. Skull: No acute fracture.  Posterior scalp contusion. Sinuses/Orbits: Clear sinuses.  No acute orbital findings. Other: No mastoid effusions. CT CERVICAL SPINE FINDINGS Alignment: Normal. Skull base and vertebrae: Vertebral body heights are maintained. No evidence of acute fracture. Soft tissues and spinal canal: No prevertebral fluid or swelling. No visible canal hematoma. Disc levels: Moderate to severe multilevel degenerative change including disease and facet/uncovertebral hypertrophy with varying degrees of neural foraminal stenosis. Upper chest: Visualized lung apices are clear. IMPRESSION: 1. No evidence of acute intracranial abnormality. 2. No evidence of acute fracture or traumatic malalignment the cervical spine. 3. Posterior scalp contusion without acute fracture. Electronically Signed   By: Margaretha Sheffield M.D.   On: 07/26/2022 14:26   CT Cervical Spine Wo Contrast  Result Date: 07/26/2022 CLINICAL DATA:  Head trauma, minor (Age >= 65y); Neck trauma (Age >= 65y) EXAM: CT HEAD WITHOUT CONTRAST CT CERVICAL SPINE WITHOUT CONTRAST TECHNIQUE: Multidetector CT imaging of the head and cervical spine was performed following the standard protocol without intravenous contrast. Multiplanar CT image reconstructions of the cervical spine were also generated. RADIATION DOSE REDUCTION: This exam was performed according to the departmental dose-optimization program which includes automated exposure control, adjustment of the mA and/or kV according to patient size and/or use of iterative reconstruction technique. COMPARISON:  None Available. FINDINGS: CT HEAD FINDINGS Brain: No evidence of acute  infarction, hemorrhage, hydrocephalus,  extra-axial collection or mass lesion/mass effect. Vascular: Calcific atherosclerosis. Skull: No acute fracture.  Posterior scalp contusion. Sinuses/Orbits: Clear sinuses.  No acute orbital findings. Other: No mastoid effusions. CT CERVICAL SPINE FINDINGS Alignment: Normal. Skull base and vertebrae: Vertebral body heights are maintained. No evidence of acute fracture. Soft tissues and spinal canal: No prevertebral fluid or swelling. No visible canal hematoma. Disc levels: Moderate to severe multilevel degenerative change including disease and facet/uncovertebral hypertrophy with varying degrees of neural foraminal stenosis. Upper chest: Visualized lung apices are clear. IMPRESSION: 1. No evidence of acute intracranial abnormality. 2. No evidence of acute fracture or traumatic malalignment the cervical spine. 3. Posterior scalp contusion without acute fracture. Electronically Signed   By: Margaretha Sheffield M.D.   On: 07/26/2022 14:26   DG Pelvis Portable  Result Date: 07/26/2022 CLINICAL DATA:  Unwitnessed fall shortly before imaging. EXAM: PORTABLE PELVIS 1-2 VIEWS COMPARISON:  CT pelvis 07/08/2022 FINDINGS: Reduced sensitivity due to body habitus and portable technique. Stable severe degenerative hip arthropathy on the right. Poor visualization of the sacrum related to technique and overlying bowel gas. No definite fracture identified. Vascular calcifications noted. IMPRESSION: 1. No fracture identified. Reduced sensitivity due to body habitus and portable technique. There is a high clinical index of suspicion of occult fracture, dedicated CT pelvis would be recommended. 2. Severe degenerative hip arthropathy on the right. 3. Atherosclerosis. Electronically Signed   By: Van Clines M.D.   On: 07/26/2022 13:56   DG Chest Port 1 View  Result Date: 07/26/2022 CLINICAL DATA:  Unwitnessed fall. EXAM: PORTABLE CHEST 1 VIEW COMPARISON:  June 22, 2022. FINDINGS: Stable cardiomegaly. Status post  coronary bypass graft. Minimal bibasilar subsegmental atelectasis or scarring is noted. Bony thorax is unremarkable. Calcified granuloma seen in right midlung. IMPRESSION: Minimal bibasilar subsegmental atelectasis or scarring. Aortic Atherosclerosis (ICD10-I70.0). Electronically Signed   By: Marijo Conception M.D.   On: 07/26/2022 13:55   CT ABDOMEN PELVIS WO CONTRAST  Result Date: 07/08/2022 CLINICAL DATA:  Abdominal pain, diarrhea, black stools EXAM: CT ABDOMEN AND PELVIS WITHOUT CONTRAST TECHNIQUE: Multidetector CT imaging of the abdomen and pelvis was performed following the standard protocol without IV contrast. RADIATION DOSE REDUCTION: This exam was performed according to the departmental dose-optimization program which includes automated exposure control, adjustment of the mA and/or kV according to patient size and/or use of iterative reconstruction technique. COMPARISON:  CT abdomen and pelvis 06/22/2022 FINDINGS: Lower chest: No acute abnormality. Hepatobiliary: No focal liver abnormality is seen. Cholecystectomy. No biliary dilation. Pancreas: Small amount of stranding about the head of the pancreas is favored due to duodenitis/duodenal diverticulitis as the majority of the fluid is centered around the duodenum. Spleen: Normal in size without focal abnormality. Adrenals/Urinary Tract: Adrenal glands are unremarkable. Kidneys are normal, without renal calculi, focal lesion, or hydronephrosis. Bladder is unremarkable. Stomach/Bowel: Duodenal diverticulum. Marked wall thickening about the second and third portions of the duodenum with adjacent hazy mesenteric fat stranding. Free fluid about the duodenum tracks inferiorly in the right anterior pararenal fascia. No free intraperitoneal air. Marked colonic diverticulosis. There is mild wall thickening about the ascending colon and cecum. Vascular/Lymphatic: Aortic atherosclerosis. No enlarged abdominal or pelvic lymph nodes. Reproductive: Status post  hysterectomy. No adnexal masses. Other: Increased nonspecific subcutaneous stranding in the lower anterior abdominal subcutaneous fat. Subcutaneous gas in the anterior abdominal wall favored due to injections. Edema within the lateral thighs bilaterally. Musculoskeletal: Advanced degenerative arthritis right hip. Unchanged superior endplate compression of L4. No  acute osseous abnormality. IMPRESSION: Marked duodenitis/duodenal diverticulitis. No evidence of perforation. Question additional mild diverticulitis of the ascending colon and cecum. Nonspecific hazy subcutaneous fat stranding within the anterior abdominal wall and bilateral thighs. Small amount of subcutaneous gas in the right anterior abdomen favored to be due to injections. Clinical correlation recommended. Aortic Atherosclerosis (ICD10-I70.0). Electronically Signed   By: Placido Sou M.D.   On: 07/08/2022 20:30   DG Abd 2 Views  Result Date: 06/29/2022 CLINICAL DATA:  Swollen upper abdomen. Recent hospitalization. Evaluate for obstruction or ileus. EXAM: ABDOMEN - 2 VIEW COMPARISON:  KUB 06/06/2022, CT abdomen pelvis 06/22/2022 FINDINGS: Moderate stool is seen throughout the ascending and proximal transverse colon. Scattered air overlying the sigmoid colon likely corresponds to air within the numerous diverticula seen within the sigmoid colon on prior CT. Mild stool within the descending colon. Air is seen within nondistended loops of small and large bowel. No portal venous gas or pneumatosis is seen. No definite pneumoperitoneum. Dense splenic arterial calcifications. Left common iliac artery stent is again seen. Right upper quadrant surgical clips. Mild levocurvature centered at the thoracolumbar junction with moderate multilevel degenerative disc changes. Severe right femoroacetabular joint space narrowing with bone-on-bone contact. IMPRESSION: Nonobstructed bowel-gas pattern.  Moderate stool within the colon. Note is made of sigmoid  diverticulitis on recent 06/22/2022 CT. Electronically Signed   By: Yvonne Kendall M.D.   On: 06/29/2022 13:46           Sherrell Puller, PA-C 07/26/22 1632    Tretha Sciara, MD 07/26/22 [phone]

## 2022-07-26 NOTE — ED Triage Notes (Addendum)
Pt bib ems from [facility]; unwitnessed fall approx 25-30 min ago; pt a and o x 4;  attempted to stand from wheelchair, forgot to lock chair,rolled from under pt; pt fell back, hit head ; pt on warfarin for afib; lac and hematoma to back of head, skin tear R elbow; denies neck,back pain; moving extremities appropriately; cbg 223, 148/64, RR 18, P92, 96% RA; DNR at bedside; c/o pain to back of head

## 2022-07-26 NOTE — Discharge Instructions (Signed)
The CT scan of your head and neck today did not show any signs of severe injury or bleeding in your skull.  X-ray of your chest and pelvis today did not show any broken bones.  Your blood work showed low hemoglobin but improved from your recent hospitalization.  However, your Coumadin test was very high.  For this reason we would like you not to take your Coumadin tonight or on Thursday night.  Please have your Coumadin level rechecked on about Friday.

## 2022-07-26 NOTE — ED Notes (Addendum)
Patient and family verbalizes understanding of discharge instructions. Opportunity for questioning and answers were provided. Pt discharged from ED.  

## 2022-07-26 NOTE — ED Notes (Signed)
Family at bedside. 

## 2022-07-26 NOTE — Progress Notes (Signed)
Orthopedic Tech Progress Note Patient Details:  [name] [birth year] [id number]  Level 2 trauma   Patient ID: [name], female   DOB: Jul 19, 1941, 81 y.o.   MRN: [id number]  [name] 07/26/2022, 1:18 PM

## 2022-07-26 NOTE — ED Notes (Signed)
Skin tear to R elbow assessed and dressed by Ovid Curd, [place]

## 2022-07-28 ENCOUNTER — Ambulatory Visit (INDEPENDENT_AMBULATORY_CARE_PROVIDER_SITE_OTHER): Payer: Self-pay | Admitting: *Deleted

## 2022-07-28 DIAGNOSIS — K922 Gastrointestinal hemorrhage, unspecified: Secondary | ICD-10-CM | POA: Diagnosis not present

## 2022-07-28 DIAGNOSIS — D591 Autoimmune hemolytic anemia, unspecified: Secondary | ICD-10-CM | POA: Diagnosis not present

## 2022-07-28 DIAGNOSIS — N184 Chronic kidney disease, stage 4 (severe): Secondary | ICD-10-CM | POA: Diagnosis not present

## 2022-07-28 DIAGNOSIS — D638 Anemia in other chronic diseases classified elsewhere: Secondary | ICD-10-CM | POA: Diagnosis not present

## 2022-07-28 DIAGNOSIS — Z952 Presence of prosthetic heart valve: Secondary | ICD-10-CM

## 2022-07-28 DIAGNOSIS — D500 Iron deficiency anemia secondary to blood loss (chronic): Secondary | ICD-10-CM | POA: Diagnosis not present

## 2022-07-28 DIAGNOSIS — F419 Anxiety disorder, unspecified: Secondary | ICD-10-CM | POA: Diagnosis not present

## 2022-07-28 DIAGNOSIS — R600 Localized edema: Secondary | ICD-10-CM | POA: Diagnosis not present

## 2022-07-28 DIAGNOSIS — K5740 Diverticulitis of both small and large intestine with perforation and abscess without bleeding: Secondary | ICD-10-CM | POA: Diagnosis not present

## 2022-07-28 DIAGNOSIS — I5032 Chronic diastolic (congestive) heart failure: Secondary | ICD-10-CM | POA: Diagnosis not present

## 2022-07-28 DIAGNOSIS — Z7901 Long term (current) use of anticoagulants: Secondary | ICD-10-CM

## 2022-07-28 DIAGNOSIS — M6281 Muscle weakness (generalized): Secondary | ICD-10-CM | POA: Diagnosis not present

## 2022-07-28 DIAGNOSIS — I700 Atherosclerosis of aorta: Secondary | ICD-10-CM | POA: Diagnosis not present

## 2022-07-28 DIAGNOSIS — E8721 Acute metabolic acidosis: Secondary | ICD-10-CM | POA: Diagnosis not present

## 2022-07-28 DIAGNOSIS — I779 Disorder of arteries and arterioles, unspecified: Secondary | ICD-10-CM | POA: Diagnosis not present

## 2022-07-28 DIAGNOSIS — D649 Anemia, unspecified: Secondary | ICD-10-CM | POA: Diagnosis not present

## 2022-07-28 NOTE — Patient Instructions (Addendum)
Description   Spoke with pt and she states still in [facility] and they will continue to monitor INR/Coumadin. Pt held last 2 doses due to supratherapeutic INR at [facility] [place] will go home on 10/21 and take Warfarin '5mg'$  (1 tablet) until INR check on 08/01/2022 at [facility].   Normal dose: 1 tablet daily except 1.5 tablets on Sunday and Friday.  Call with Prednisone doseage changes ('20mg'$  daily). Please keep green leafy veggies and boost in your diet. Call with any new meds [phone] & if you have any procedures where you need to hold warfarin, have them fax a clearance form or labs to [phone].

## 2022-08-01 ENCOUNTER — Inpatient Hospital Stay: Payer: Medicare HMO

## 2022-08-01 ENCOUNTER — Telehealth: Payer: Self-pay | Admitting: *Deleted

## 2022-08-01 ENCOUNTER — Other Ambulatory Visit: Payer: Self-pay

## 2022-08-01 ENCOUNTER — Inpatient Hospital Stay (HOSPITAL_BASED_OUTPATIENT_CLINIC_OR_DEPARTMENT_OTHER): Payer: Medicare HMO | Admitting: Hematology and Oncology

## 2022-08-01 VITALS — BP 137/65 | HR 84 | Temp 97.7°F | Resp 16 | Ht 60.0 in | Wt 147.5 lb

## 2022-08-01 VITALS — BP 157/64 | HR 86 | Resp 17

## 2022-08-01 DIAGNOSIS — N184 Chronic kidney disease, stage 4 (severe): Secondary | ICD-10-CM | POA: Diagnosis not present

## 2022-08-01 DIAGNOSIS — R197 Diarrhea, unspecified: Secondary | ICD-10-CM | POA: Diagnosis not present

## 2022-08-01 DIAGNOSIS — D591 Autoimmune hemolytic anemia, unspecified: Secondary | ICD-10-CM

## 2022-08-01 DIAGNOSIS — R7402 Elevation of levels of lactic acid dehydrogenase (LDH): Secondary | ICD-10-CM | POA: Diagnosis not present

## 2022-08-01 DIAGNOSIS — M8580 Other specified disorders of bone density and structure, unspecified site: Secondary | ICD-10-CM | POA: Diagnosis not present

## 2022-08-01 DIAGNOSIS — K922 Gastrointestinal hemorrhage, unspecified: Secondary | ICD-10-CM | POA: Diagnosis not present

## 2022-08-01 DIAGNOSIS — I7782 Antineutrophilic cytoplasmic antibody (ANCA) vasculitis: Secondary | ICD-10-CM | POA: Diagnosis not present

## 2022-08-01 DIAGNOSIS — D500 Iron deficiency anemia secondary to blood loss (chronic): Secondary | ICD-10-CM

## 2022-08-01 DIAGNOSIS — D631 Anemia in chronic kidney disease: Secondary | ICD-10-CM | POA: Diagnosis not present

## 2022-08-01 DIAGNOSIS — D638 Anemia in other chronic diseases classified elsewhere: Secondary | ICD-10-CM

## 2022-08-01 DIAGNOSIS — I480 Paroxysmal atrial fibrillation: Secondary | ICD-10-CM | POA: Diagnosis not present

## 2022-08-01 DIAGNOSIS — D594 Other nonautoimmune hemolytic anemias: Secondary | ICD-10-CM

## 2022-08-01 LAB — RETICULOCYTES
Immature Retic Fract: 4.1 % (ref 2.3–15.9)
RBC.: 2.76 MIL/uL — ABNORMAL LOW (ref 3.87–5.11)
Retic Count, Absolute: 70.4 10*3/uL (ref 19.0–186.0)
Retic Ct Pct: 2.6 % (ref 0.4–3.1)

## 2022-08-01 LAB — CBC WITH DIFFERENTIAL/PLATELET
Abs Immature Granulocytes: 0 10*3/uL (ref 0.00–0.07)
Basophils Absolute: 0 10*3/uL (ref 0.0–0.1)
Basophils Relative: 0 %
Eosinophils Absolute: 0 10*3/uL (ref 0.0–0.5)
Eosinophils Relative: 0 %
HCT: 26.6 % — ABNORMAL LOW (ref 36.0–46.0)
Hemoglobin: 8.9 g/dL — ABNORMAL LOW (ref 12.0–15.0)
Immature Granulocytes: 0 %
Lymphocytes Relative: 60 %
Lymphs Abs: 0.4 10*3/uL — ABNORMAL LOW (ref 0.7–4.0)
MCH: 31.8 pg (ref 26.0–34.0)
MCHC: 33.5 g/dL (ref 30.0–36.0)
MCV: 95 fL (ref 80.0–100.0)
Monocytes Absolute: 0.1 10*3/uL (ref 0.1–1.0)
Monocytes Relative: 10 %
Neutro Abs: 0.2 10*3/uL — CL (ref 1.7–7.7)
Neutrophils Relative %: 30 %
Platelets: 129 10*3/uL — ABNORMAL LOW (ref 150–400)
RBC: 2.8 MIL/uL — ABNORMAL LOW (ref 3.87–5.11)
RDW: 18.1 % — ABNORMAL HIGH (ref 11.5–15.5)
Smear Review: NORMAL
WBC: 0.7 10*3/uL — CL (ref 4.0–10.5)
nRBC: 0 % (ref 0.0–0.2)

## 2022-08-01 LAB — COMPREHENSIVE METABOLIC PANEL
ALT: 22 U/L (ref 0–44)
AST: 29 U/L (ref 15–41)
Albumin: 3.3 g/dL — ABNORMAL LOW (ref 3.5–5.0)
Alkaline Phosphatase: 98 U/L (ref 38–126)
Anion gap: 9 (ref 5–15)
BUN: 45 mg/dL — ABNORMAL HIGH (ref 8–23)
CO2: 22 mmol/L (ref 22–32)
Calcium: 8.8 mg/dL — ABNORMAL LOW (ref 8.9–10.3)
Chloride: 102 mmol/L (ref 98–111)
Creatinine, Ser: 1.93 mg/dL — ABNORMAL HIGH (ref 0.44–1.00)
GFR, Estimated: 25 mL/min — ABNORMAL LOW (ref >60)
Glucose, Bld: 284 mg/dL — ABNORMAL HIGH (ref 70–99)
Potassium: 5.1 mmol/L (ref 3.5–5.1)
Sodium: 133 mmol/L — ABNORMAL LOW (ref 135–145)
Total Bilirubin: 0.5 mg/dL (ref 0.3–1.2)
Total Protein: 5.4 g/dL — ABNORMAL LOW (ref 6.5–8.1)

## 2022-08-01 LAB — SAMPLE TO BLOOD BANK

## 2022-08-01 LAB — PROTIME-INR
INR: 1 (ref 0.8–1.2)
Prothrombin Time: 13.3 seconds (ref 11.4–15.2)

## 2022-08-01 LAB — LACTATE DEHYDROGENASE: LDH: 254 U/L — ABNORMAL HIGH (ref 98–192)

## 2022-08-01 MED ORDER — EPOETIN ALFA-EPBX 40000 UNIT/ML IJ SOLN
40000.0000 [IU] | Freq: Once | INTRAMUSCULAR | Status: DC
  Filled 2022-08-01: qty 1

## 2022-08-01 MED ORDER — EPOETIN ALFA-EPBX 40000 UNIT/ML IJ SOLN
40000.0000 [IU] | Freq: Once | INTRAMUSCULAR | Status: AC
  Administered 2022-08-01: 40000 [IU] via SUBCUTANEOUS

## 2022-08-01 NOTE — Patient Instructions (Signed)

## 2022-08-01 NOTE — Telephone Encounter (Signed)
CRITICAL VALUE STICKER  CRITICAL VALUE: WBC 0.7, ANC 0.2  RECEIVER (on-site recipient of call): Dow Adolph, [place] NOTIFIED: 08/01/2022 1627  MESSENGER (representative from lab): Nira Conn  MD NOTIFIED: Marlon Pel, RN - Dr. Rob Hickman nurse  TIME OF NOTIFICATION: [phone]  RESPONSE:  RN will make MD aware

## 2022-08-01 NOTE — Telephone Encounter (Signed)
This RN received panic lab values of WBC of 0.7 and ANC of 0.2- note heme is 8.9 at 1629.  This RN called Dr Chryl Heck and informed her of results- with request by her to call the patient and review neutropenic concerns.  This RN called [name] and reviewed lab with concern that she presently is at risk for a mild infection to get worse quickly- discussed need to call on call and or go to the ER due to any symptoms such as chills and shivers or an ongoing cough, or fever. This RN reiterated though she has just gotten back home from being in the hospital and then rehab - the concern for her counts presently and to not avoid going to the ER if needed.  Per inquiry she states she was on an antibiotic while in the hospital but not now.  She also states she has an appointment tomorrow at 330 pm with [name],  This note will be forwarded to MD for review and any additional recommendations to follow up tomorrow.

## 2022-08-01 NOTE — Progress Notes (Signed)
[facility] Follow up:    [name], [name], MD [place] [address]   DIAGNOSIS: Hemolytic Anemia  SUMMARY OF HEMATOLOGIC HISTORY: [name] is a 81 y.o. [place] woman with multifactorial anemia, as follows:             (a) anemia of chronic inflammation: As of May 2020 she has a SED rate of 95, CRP 1.1, positive ANA and RF; subsequently she was found to be ANCA positive             (b) hemolysis (mild): she has a mildly elevated LDH and a DAT positive for complement, not IgG; this is c/w (a) above             (c) anemia of renal insufficiency: inadequate EPO resonse to anemia                         (d) GIB/ iron deficiency in patient on lifelong anticoagulaton   (1) Feraheme received 03/01/2019, repeated 03/20/2019              (a) reticulocyte 03/07/2019 up from 43.4 a year ago to 191.8 after iron infusion             (b) feraheme repeated on 05/12/2019             (c) subsequent ferritin levels >100   (2) darbepoetin: starting 05/05/2019 at 154mg given every 2 weeks             (a) dose increased to 200 mcg every week starting 07/01/2019              (b) back to every 2 weeks beginning 08/04/2019 still at 200 mcg dose             (c) switched to Retacrit every 4 weeks as of 08/18/2019   (3) ANCA positive vasculitis: diagnosed 05/08/2019 (titer 1:640)             (a) prednisone 40 mg daily started 05/31/2019             (b) rituximab weekly started 06/12/2019 (received test dose 06/05/2019)                         (i) hepatitis B sAg, core Ab and HIV negative 05/08/2019                         (ii) rituximab held after 07/14/2019 dose (received 5 weekly doses) with neutropenia                         (iii) rituximab resumed 08/18/2019 but changed to monthly                         (iv) rituximab changed to every 2 months beginning with 02/02/2020 dose                         (v) rituximab changed to every 12 weeks after the  10/12/2020 dose             (c) prednisone tapered to off as of 09/15/2019                          (4) Anemia worsened in spring 2023,  refractory to Rituximab and requiring prednisone restart with slow taper due currently at $[id number]$  which she will stay at.   (5) osteopenia with a T score of -1.5 on bone density 02/28/2016 at the breast center  CURRENT THERAPY: Prednsione 20 mg  INTERVAL HISTORY: KORDELIA SEVERIN 81 y.o. female returns for follow-up of her hemolytic anemia.  She is here today with her daughter.  Since last visit, she was admitted for diverticulitis, discharged to nursing facility.  She was treated for a touch of pneumonia while in the nursing facility.  She had a fall, hurt her right leg as well as bumped her head, had some stitches.  She continues on Lasix 80 mg in the a.m. and 60 in the p.m.  She continues to feel tired.  Rest of the pertinent 10 point ROS reviewed and negative  Patient Active Problem List   Diagnosis Date Noted   Palliative care encounter    Goals of care, counseling/discussion    Counseling and coordination of care    GI bleed 07/09/2022   GI bleeding 07/08/2022   Acute on chronic anemia 07/08/2022   History of Clostridioides difficile colitis 07/08/2022   DNR (do not resuscitate)/DNI(Do Not Intubate) 07/08/2022   Anemia of chronic disease 07/07/2022   Pressure injury of skin 06/23/2022   UTI (urinary tract infection) 06/22/2022   Joint pain 06/09/2022   Parietoalveolar pneumopathy ([place]) 06/09/2022   Primary osteoarthritis 06/09/2022   Rheumatoid factor positive 06/09/2022   C. difficile colitis 04/19/2022   Colitis 04/18/2022   PAH (pulmonary artery hypertension) ([place]) 04/18/2022   Emphysematous cystitis 04/13/2022   Female bladder prolapse 04/13/2022   Diverticulitis 04/12/2022   Autoimmune hemolytic anemia ([place]) 01/02/2022   Bilateral lower extremity edema 01/02/2022   Refractory anemia (HCC) 12/15/2021   Abnormal chest x-ray 12/15/2021    Supratherapeutic INR 12/15/2021   Elevated troponin 12/15/2021   Unilateral primary osteoarthritis, right hip 05/31/2021   Midline cystocele 05/30/2021   Primary localized osteoarthritis of pelvic region and thigh 05/30/2021   Vaginal pain 05/30/2021   Asymmetric SNHL (sensorineural hearing loss) 01/06/2021   Referred otalgia of both ears 01/06/2021   Bilateral hearing loss 08/31/2020   Low back pain 02/20/2020   CKD (chronic kidney disease), stage IV ([place]) 10/13/2019   Leukopenia due to antineoplastic chemotherapy ([place]) 08/18/2019   Thrombophilia ([place]) 06/19/2019   Encounter for general adult medical examination without abnormal findings 06/13/2019   Arteritis ([place]) 06/06/2019   Hypertensive heart and chronic kidney disease with heart failure and stage 1 through stage 4 chronic kidney disease, or unspecified chronic kidney disease ([place]) 06/06/2019   Muscle weakness 06/06/2019   Pancytopenia ([place]) 05/28/2019   Dyspnea 05/28/2019   Vasculitis, ANCA positive 05/28/2019   Disorder of connective tissue ([place]) 05/14/2019   Renal insufficiency 05/08/2019   PAF (paroxysmal atrial fibrillation) ([place]) 04/07/2019   Iron deficiency anemia 02/28/2019   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 ([place]) 02/28/2019   Diabetic retinopathy associated with type 2 diabetes mellitus ([place]) 09/02/2018   Chronic pain 06/19/2018   Non-thrombocytopenic purpura ([place]) 05/21/2018   Gout 03/22/2018   Malnutrition of moderate degree 03/12/2018   Diabetes mellitus type 2, controlled ([place]) 03/06/2018   H/O mechanical aortic valve replacement 03/06/2018   Moderate to severe pulmonary hypertension ([place]) 03/06/2018   GERD (gastroesophageal reflux disease) 02/21/2018   Anxiety 02/21/2018   Chronic diastolic CHF (congestive heart failure) ([place]) 02/21/2018   General weakness 02/02/2018   Thrombocytopenia ([place]) 01/25/2018   Arm pain,  diffuse 01/25/2018   Anemia 01/23/2018   Aortic atherosclerosis  ([place]) 01/23/2018   Age-related osteoporosis without current pathological fracture 05/24/2017   Carotid artery occlusion 05/24/2017   Generalized anxiety disorder 05/24/2017   Hearing loss 05/24/2017   Presence of prosthetic heart valve 05/24/2017   Carotid artery disease ([place]) 08/18/2013   Peripheral arterial disease ([place]) 08/18/2013   Diabetes mellitus without complication ([place]) 03/70/4888   Hypertension    Hypercholesteremia    Mechanical heart valve present    Chronic anticoagulation     is allergic to flagyl [metronidazole], augmentin [amoxicillin-pot clavulanate], ciprofloxacin, coreg [carvedilol], diflucan [fluconazole], losartan, privigen [immune globulin (human)], sulfamethoxazole-trimethoprim, verapamil, zetia [ezetimibe], zocor [simvastatin - high dose], and gatifloxacin.  MEDICAL HISTORY: Past Medical History:  Diagnosis Date   Aortic atherosclerosis ([place]) 01/23/2018   Atrial fibrillation (HCC)    Benign positional vertigo 10/06/2011   Carotid artery disease (HCC)    Chemotherapy induced neutropenia ([place]) 07/21/2019   Cholelithiasis 01/23/2018   Cholelithiasis 01/23/2018   Chronic anticoagulation    Colovesical fistula, ruled out 04/14/2022   Coronary artery disease    status post coronary artery bypass grafting times 07/10/2004   Diabetes mellitus    Hypercholesteremia    Hypertension    Mechanical heart valve present    H. aortic valve replacement at the time of bypass surgery October 2005   Moderate to severe pulmonary hypertension ([place])    Peripheral arterial disease ([place])    history of left common iliac artery PTA and stenting for a chronic total occlusion 08/26/01   S/P cholecystectomy 03/06/2018    SURGICAL HISTORY: Past Surgical History:  Procedure Laterality Date   anterior repair  2009   AORTIC VALVE REPLACEMENT  2005   Dr. Cyndia Bent   CARDIAC CATHETERIZATION  11/10/2004   40% right common illiac, 70% in stent restenosis of distal left common illiac,     CARDIAC CATHETERIZATION  05/18/2004   LAD 50-70% midstenosis, RCA dominant w/50% stenosis, 50% Right common Illiac artery ostial stenosis, 90% in stent restenosis within midportion of left common illiac stent   Carotid Duplex  03/12/2012   RSA-elev. velocities suggestive of a 50-69% diameter reduction, Right&Left Bulb/Prox ICA-mild-mod.fibrous plaqueelevating Velocities abnormal study.   CHOLECYSTECTOMY N/A 03/01/2018   Procedure: LAPAROSCOPIC CHOLECYSTECTOMY;  Surgeon: Judeth Horn, MD;  Location: [facility];  Service: General;  Laterality: N/A;   COLONOSCOPY WITH PROPOFOL N/A 01/22/2018   Procedure: COLONOSCOPY WITH PROPOFOL;  Surgeon: Wilford Corner, MD;  Location: [facility];  Service: Endoscopy;  Laterality: N/A;   ESOPHAGOGASTRODUODENOSCOPY N/A 07/13/2022   Procedure: ESOPHAGOGASTRODUODENOSCOPY (EGD);  Surgeon: [name], MD;  Location: [facility];  Service: Gastroenterology;  Laterality: N/A;   ESOPHAGOGASTRODUODENOSCOPY (EGD) WITH PROPOFOL N/A 01/22/2018   Procedure: ESOPHAGOGASTRODUODENOSCOPY (EGD) WITH PROPOFOL;  Surgeon: Wilford Corner, MD;  Location: [facility];  Service: Endoscopy;  Laterality: N/A;   ESOPHAGOGASTRODUODENOSCOPY (EGD) WITH PROPOFOL N/A 11/21/2018   Procedure: ESOPHAGOGASTRODUODENOSCOPY (EGD) WITH PROPOFOL;  Surgeon: [name], MD;  Location: [facility];  Service: Gastroenterology;  Laterality: N/A;   ESOPHAGOGASTRODUODENOSCOPY (EGD) WITH PROPOFOL Left 02/27/2019   Procedure: ESOPHAGOGASTRODUODENOSCOPY (EGD) WITH PROPOFOL;  Surgeon: Arta Silence, MD;  Location: [facility];  Service: Endoscopy;  Laterality: Left;   GIVENS CAPSULE STUDY N/A 11/21/2018   Procedure: GIVENS CAPSULE STUDY;  Surgeon: [name], MD;  Location: [facility];  Service: Gastroenterology;  Laterality: N/A;  To be deployed during EGD   Lower Ext. Duplex  03/12/2012   Right Proximal CIA- vessel narrowing w/elevated velocities 0-49% diameter reduction. Right  SFA-mild mixed density  plaque throughout vessel.   NM MYOCAR PERF WALL MOTION  05/19/2010   protocol: Persantine, post stress EF 65%, negative for ischemia, low risk scan   RIGHT HEART CATH N/A 06/27/2018   Procedure: RIGHT HEART CATH;  Surgeon: [name], MD;  Location: [facility];  Service: Cardiovascular;  Laterality: N/A;   TOTAL ABDOMINAL HYSTERECTOMY W/ BILATERAL SALPINGOOPHORECTOMY  1989   TRANSTHORACIC ECHOCARDIOGRAM  08/29/2012   Moderately calcified annulus of mitral valve, moderate regurg. of both mitral valve and tricuspid valve.     SOCIAL HISTORY: Social History   Socioeconomic History   Marital status: Widowed    Spouse name: Not on file   Number of children: Not on file   Years of education: Not on file   Highest education level: Not on file  Occupational History   Occupation: Retired  Tobacco Use   Smoking status: Former    Packs/day: 1.00    Years: 30.00    Total pack years: 30.00    Types: Cigarettes   Smokeless tobacco: Never   Tobacco comments:    quit smoking 2005  Vaping Use   Vaping Use: Never used  Substance and Sexual Activity   Alcohol use: No    Alcohol/week: 0.0 standard drinks of alcohol   Drug use: No   Sexual activity: Not Currently    Birth control/protection: None  Other Topics Concern   Not on file  Social History Narrative   Not on file   Social Determinants of Health   Financial Resource Strain: Low Risk  (02/28/2022)   Overall Financial Resource Strain (CARDIA)    Difficulty of Paying Living Expenses: Not hard at all  Food Insecurity: No Food Insecurity (07/09/2022)   Hunger Vital Sign    Worried About Running Out of Food in the Last Year: Never true    [place] in the Last Year: Never true  Transportation Needs: No Transportation Needs (07/09/2022)   PRAPARE - Hydrologist (Medical): No    Lack of Transportation (Non-Medical): No  Physical Activity: Inactive (02/28/2022)   Exercise Vital Sign     Days of Exercise per Week: 0 days    Minutes of Exercise per Session: 0 min  Stress: No Stress Concern Present (02/28/2022)   [place]    Feeling of Stress : Only a little  Social Connections: Moderately Integrated (06/22/2022)   Social Connection and Isolation Panel [NHANES]    Frequency of Communication with Friends and Family: More than three times a week    Frequency of Social Gatherings with Friends and Family: More than three times a week    Attends Religious Services: More than 4 times per year    Active Member of [employer] or Organizations: Yes    Attends Archivist Meetings: More than 4 times per year    Marital Status: Widowed  Intimate Partner Violence: Not At Risk (07/09/2022)   Humiliation, Afraid, Rape, and Kick questionnaire    Fear of Current or Ex-Partner: No    Emotionally Abused: No    Physically Abused: No    Sexually Abused: No    FAMILY HISTORY: Family History  Problem Relation Age of Onset   Heart disease Father    Hypertension Father    Hyperlipidemia Father    Breast cancer Neg Hx     Review of Systems  Constitutional:  Positive for fatigue. Negative  for appetite change, chills, fever and unexpected weight change.  HENT:   Negative for hearing loss, lump/mass, mouth sores, trouble swallowing and voice change.   Eyes:  Negative for eye problems and icterus.  Respiratory:  Negative for chest tightness, cough and shortness of breath.   Cardiovascular:  Positive for leg swelling. Negative for chest pain and palpitations.  Gastrointestinal:  Negative for abdominal distention, abdominal pain, blood in stool, constipation, diarrhea, nausea, rectal pain and vomiting.  Endocrine: Negative for hot flashes.  Genitourinary:  Negative for difficulty urinating.   Musculoskeletal:  Negative for arthralgias.  Skin:  Negative for itching and rash.  Neurological:  Negative for dizziness, extremity  weakness, headaches and numbness.  Hematological:  Negative for adenopathy. Does not bruise/bleed easily.  Psychiatric/Behavioral:  Negative for depression. The patient is not nervous/anxious.       PHYSICAL EXAMINATION  ECOG PERFORMANCE STATUS: 1 - Symptomatic but completely ambulatory  Vitals:   08/01/22 1504  BP: 137/65  Pulse: 84  Resp: 16  Temp: 97.7 F (36.5 C)  SpO2: 99%    Physical Exam Constitutional:      General: She is not in acute distress.    Appearance: Normal appearance. She is not toxic-appearing.     Comments: She appears very tired, in a wheelchair  HENT:     Head: Normocephalic and atraumatic.  Eyes:     General: No scleral icterus. Cardiovascular:     Rate and Rhythm: Normal rate. Rhythm irregular.     Pulses: Normal pulses.     Heart sounds: Murmur (Systolic murmur with a click.) heard.  Pulmonary:     Effort: Pulmonary effort is normal.     Breath sounds: No rhonchi.  Abdominal:     General: Abdomen is flat. Bowel sounds are normal. There is no distension (Slight distention).     Palpations: Abdomen is soft.     Tenderness: There is no abdominal tenderness.  Musculoskeletal:        General: Swelling (Severe bilateral lower extremity swelling, legs wrapped in Ace wrap's) present.     Cervical back: Neck supple.     Right lower leg: Edema present.     Left lower leg: Edema present.  Lymphadenopathy:     Cervical: No cervical adenopathy.  Skin:    General: Skin is warm and dry.     Findings: Bruising (Small areas of ecchymosis on the left lower leg.) present. No rash.  Neurological:     General: No focal deficit present.     Mental Status: She is alert.  Psychiatric:        Mood and Affect: Mood normal.        Behavior: Behavior normal.     LABORATORY DATA:  CBC    Component Value Date/Time   WBC 2.7 (L) 07/26/2022 1330   RBC 2.94 (L) 07/26/2022 1330   HGB 9.3 (L) 07/26/2022 1330   HGB 7.8 (L) 05/25/2022 1131   HCT 27.7 (L)  07/26/2022 1330   HCT 28.5 (L) 11/20/2018 1824   PLT 112 (L) 07/26/2022 1330   PLT 105 (L) 05/25/2022 1131   MCV 94.2 07/26/2022 1330   MCH 31.6 07/26/2022 1330   MCHC 33.6 07/26/2022 1330   RDW 18.2 (H) 07/26/2022 1330   LYMPHSABS 0.3 (L) 07/26/2022 1330   MONOABS 0.2 07/26/2022 1330   EOSABS 0.0 07/26/2022 1330   BASOSABS 0.0 07/26/2022 1330    CMP     Component Value Date/Time   NA 134 (L)  07/26/2022 1330   NA 137 12/06/2017 1436   K 4.1 07/26/2022 1330   CL 104 07/26/2022 1330   CO2 20 (L) 07/26/2022 1330   GLUCOSE 227 (H) 07/26/2022 1330   BUN 41 (H) 07/26/2022 1330   BUN 20 12/06/2017 1436   CREATININE 1.97 (H) 07/26/2022 1330   CREATININE 2.05 (H) 07/21/2022 1056   CALCIUM 8.3 (L) 07/26/2022 1330   PROT 4.9 (L) 07/21/2022 1056   ALBUMIN 3.0 (L) 07/21/2022 1056   AST 23 07/21/2022 1056   ALT 17 07/21/2022 1056   ALKPHOS 87 07/21/2022 1056   BILITOT 0.4 07/21/2022 1056   GFRNONAA 25 (L) 07/26/2022 1330   GFRNONAA 24 (L) 07/21/2022 1056   GFRAA 32 (L) 03/30/2020 0932    ASSESSMENT and THERAPY PLAN:    LAMIYAH SCHLOTTER is a 81 y.o. female who presents for multifactorial anemia, previously treated with rituximab for ANCA positive hemolytic anemia, previously on maintenance rituximab every 12 weeks presented with worsening anemia thought to be related to her hemolysis.  She was diagnosed with elevated ANCA titers 1 is to 640 and previously treated for ANCA positive hemolytic anemia, seen by rheumatology Dr. Salome Holmes back in 2020, responded at that time to prednisone and rituximab and continued on rituximab maintenance.  However her rituximab was changed to every 65-month infusions and she relapsed before her second cycle of every 51-month rituximab.    # Multifactorial Anemia:    --Bone marrow biopsy from 02/20/2022 showed normocellular marrow with trilineage hematopoiesis. No leukemia, lymphoma or high-grade dysplasia identified. --CT CAP from 02/20/2022 did not show  any underlying cause for anemia.  --ANCA titers are normal when checked on 02/10/2022 --Currently on prednisone 20 mg PO daily, mycophenolate was increased to 1000 mg however this was discontinued while she was hospitalized -- So far we have attempted prednisone taper 4 times and this was not successful.  Every time we dropped her dose below 20 mg, she has worsening hemolysis and anemia requiring blood transfusion.   -- Since her last visit, she was once again admitted with diverticulitis.  I have spoken to Dr. Redmond Pulling from [facility] and he once again is puzzled by the exact diagnosis however recommended discontinuing or tapering the prednisone.  We have discussed about restarting the EPO every 14 days, this has been ordered.  I gave clear taper instructions to the patient today.  They will proceed with labs, EPO shot today.  Return to clinic in about 1 week at [facility] for labs and follow-up with me in about 2 weeks.  They were strongly encouraged to contact us if she does not feel well during the prednisone taper.   #Erythema of bilateral lower extremities, likely venous stasis, no definitive evidence of cellulitis. She is wrapped in Ace wrap's, hence legs not examined.  Patient however denies any worsening redness.  She does have severe bilateral lower extremity swelling, currently on Lasix managed by nephrology.   #Warfarin therapy for St[employer] Jude's valve --Under the care of  [facility].   #CKD:  -- Followed by nephrology.   I have reviewed labs from last week. Total time spent: 40 min  *Total Encounter Time as defined by the Centers for Medicare and Medicaid Services includes, in addition to the face-to-face time of a patient visit (documented in the note above) non-face-to-face time: obtaining and reviewing outside history, ordering and reviewing medications, tests or procedures, care coordination (communications with other health care professionals or caregivers) and documentation in the medical  record.

## 2022-08-02 ENCOUNTER — Ambulatory Visit (INDEPENDENT_AMBULATORY_CARE_PROVIDER_SITE_OTHER): Payer: Medicare HMO | Admitting: *Deleted

## 2022-08-02 DIAGNOSIS — Z23 Encounter for immunization: Secondary | ICD-10-CM | POA: Diagnosis not present

## 2022-08-02 DIAGNOSIS — Z952 Presence of prosthetic heart valve: Secondary | ICD-10-CM | POA: Diagnosis not present

## 2022-08-02 DIAGNOSIS — Z7901 Long term (current) use of anticoagulants: Secondary | ICD-10-CM

## 2022-08-02 DIAGNOSIS — E1151 Type 2 diabetes mellitus with diabetic peripheral angiopathy without gangrene: Secondary | ICD-10-CM | POA: Diagnosis not present

## 2022-08-02 DIAGNOSIS — I5032 Chronic diastolic (congestive) heart failure: Secondary | ICD-10-CM | POA: Diagnosis not present

## 2022-08-02 DIAGNOSIS — A0472 Enterocolitis due to Clostridium difficile, not specified as recurrent: Secondary | ICD-10-CM | POA: Diagnosis not present

## 2022-08-02 DIAGNOSIS — N184 Chronic kidney disease, stage 4 (severe): Secondary | ICD-10-CM | POA: Diagnosis not present

## 2022-08-02 DIAGNOSIS — E11319 Type 2 diabetes mellitus with unspecified diabetic retinopathy without macular edema: Secondary | ICD-10-CM | POA: Diagnosis not present

## 2022-08-02 DIAGNOSIS — K5792 Diverticulitis of intestine, part unspecified, without perforation or abscess without bleeding: Secondary | ICD-10-CM | POA: Diagnosis not present

## 2022-08-02 DIAGNOSIS — N3946 Mixed incontinence: Secondary | ICD-10-CM | POA: Diagnosis not present

## 2022-08-02 DIAGNOSIS — R350 Frequency of micturition: Secondary | ICD-10-CM | POA: Diagnosis not present

## 2022-08-02 DIAGNOSIS — I739 Peripheral vascular disease, unspecified: Secondary | ICD-10-CM | POA: Diagnosis not present

## 2022-08-02 DIAGNOSIS — I130 Hypertensive heart and chronic kidney disease with heart failure and stage 1 through stage 4 chronic kidney disease, or unspecified chronic kidney disease: Secondary | ICD-10-CM | POA: Diagnosis not present

## 2022-08-02 DIAGNOSIS — I7782 Antineutrophilic cytoplasmic antibody (ANCA) vasculitis: Secondary | ICD-10-CM | POA: Diagnosis not present

## 2022-08-02 NOTE — Patient Instructions (Signed)
Description   Called pt and instructed to take 1.5 tablets today and 1.5 tablets tomorrow and then resume taking 1 tablet daily except 1.5 tablets on Sunday and Friday.  Call with Prednisone doseage changes ('15mg'$  daily - 10/25).  Please keep green leafy veggies  Call with any new meds or changes [phone]

## 2022-08-03 ENCOUNTER — Telehealth: Payer: Self-pay | Admitting: *Deleted

## 2022-08-03 DIAGNOSIS — E7800 Pure hypercholesterolemia, unspecified: Secondary | ICD-10-CM | POA: Diagnosis not present

## 2022-08-03 DIAGNOSIS — E1151 Type 2 diabetes mellitus with diabetic peripheral angiopathy without gangrene: Secondary | ICD-10-CM | POA: Diagnosis not present

## 2022-08-03 DIAGNOSIS — I2510 Atherosclerotic heart disease of native coronary artery without angina pectoris: Secondary | ICD-10-CM | POA: Diagnosis not present

## 2022-08-03 DIAGNOSIS — I110 Hypertensive heart disease with heart failure: Secondary | ICD-10-CM | POA: Diagnosis not present

## 2022-08-03 DIAGNOSIS — F419 Anxiety disorder, unspecified: Secondary | ICD-10-CM | POA: Diagnosis not present

## 2022-08-03 DIAGNOSIS — I480 Paroxysmal atrial fibrillation: Secondary | ICD-10-CM | POA: Diagnosis not present

## 2022-08-03 DIAGNOSIS — I5032 Chronic diastolic (congestive) heart failure: Secondary | ICD-10-CM | POA: Diagnosis not present

## 2022-08-03 DIAGNOSIS — K922 Gastrointestinal hemorrhage, unspecified: Secondary | ICD-10-CM | POA: Diagnosis not present

## 2022-08-03 DIAGNOSIS — I272 Pulmonary hypertension, unspecified: Secondary | ICD-10-CM | POA: Diagnosis not present

## 2022-08-03 NOTE — Telephone Encounter (Signed)
This RN spoke with pt today- she states she is doing well - " and I feel good ", denies any issues.  Pt will go tomorrow to [facility] for lab.  This RN faxed lab orders to above.  With call to the [facility] lab verifying receipt of orders.

## 2022-08-04 ENCOUNTER — Other Ambulatory Visit: Payer: Self-pay

## 2022-08-04 ENCOUNTER — Telehealth: Payer: Self-pay | Admitting: *Deleted

## 2022-08-04 ENCOUNTER — Emergency Department (HOSPITAL_COMMUNITY)
Admission: EM | Admit: 2022-08-04 | Discharge: 2022-08-04 | Disposition: A | Discharge status: Home / Self Care | Payer: Medicare HMO | Attending: Emergency Medicine | Admitting: Emergency Medicine

## 2022-08-04 ENCOUNTER — Emergency Department (HOSPITAL_COMMUNITY): Payer: Medicare HMO

## 2022-08-04 ENCOUNTER — Encounter (HOSPITAL_COMMUNITY): Payer: Self-pay

## 2022-08-04 ENCOUNTER — Inpatient Hospital Stay: Payer: Medicare HMO

## 2022-08-04 DIAGNOSIS — Z7901 Long term (current) use of anticoagulants: Secondary | ICD-10-CM | POA: Diagnosis not present

## 2022-08-04 DIAGNOSIS — I4811 Longstanding persistent atrial fibrillation: Secondary | ICD-10-CM | POA: Diagnosis not present

## 2022-08-04 DIAGNOSIS — Z79899 Other long term (current) drug therapy: Secondary | ICD-10-CM | POA: Diagnosis not present

## 2022-08-04 DIAGNOSIS — R600 Localized edema: Secondary | ICD-10-CM | POA: Insufficient documentation

## 2022-08-04 DIAGNOSIS — D709 Neutropenia, unspecified: Secondary | ICD-10-CM | POA: Insufficient documentation

## 2022-08-04 DIAGNOSIS — J439 Emphysema, unspecified: Secondary | ICD-10-CM | POA: Diagnosis not present

## 2022-08-04 DIAGNOSIS — I5030 Unspecified diastolic (congestive) heart failure: Secondary | ICD-10-CM | POA: Insufficient documentation

## 2022-08-04 DIAGNOSIS — I110 Hypertensive heart disease with heart failure: Secondary | ICD-10-CM | POA: Diagnosis not present

## 2022-08-04 DIAGNOSIS — I4891 Unspecified atrial fibrillation: Secondary | ICD-10-CM | POA: Diagnosis not present

## 2022-08-04 DIAGNOSIS — J90 Pleural effusion, not elsewhere classified: Secondary | ICD-10-CM | POA: Diagnosis not present

## 2022-08-04 DIAGNOSIS — Z794 Long term (current) use of insulin: Secondary | ICD-10-CM | POA: Insufficient documentation

## 2022-08-04 DIAGNOSIS — E119 Type 2 diabetes mellitus without complications: Secondary | ICD-10-CM | POA: Insufficient documentation

## 2022-08-04 DIAGNOSIS — R5383 Other fatigue: Secondary | ICD-10-CM | POA: Diagnosis present

## 2022-08-04 DIAGNOSIS — D591 Autoimmune hemolytic anemia, unspecified: Secondary | ICD-10-CM

## 2022-08-04 DIAGNOSIS — I5033 Acute on chronic diastolic (congestive) heart failure: Secondary | ICD-10-CM

## 2022-08-04 LAB — SAMPLE TO BLOOD BANK

## 2022-08-04 LAB — COMPREHENSIVE METABOLIC PANEL
ALT: 16 U/L (ref 0–44)
AST: 19 U/L (ref 15–41)
Albumin: 2.9 g/dL — ABNORMAL LOW (ref 3.5–5.0)
Alkaline Phosphatase: 81 U/L (ref 38–126)
Anion gap: 8 (ref 5–15)
BUN: 46 mg/dL — ABNORMAL HIGH (ref 8–23)
CO2: 25 mmol/L (ref 22–32)
Calcium: 7.9 mg/dL — ABNORMAL LOW (ref 8.9–10.3)
Chloride: 103 mmol/L (ref 98–111)
Creatinine, Ser: 2.11 mg/dL — ABNORMAL HIGH (ref 0.44–1.00)
GFR, Estimated: 23 mL/min — ABNORMAL LOW (ref >60)
Glucose, Bld: 186 mg/dL — ABNORMAL HIGH (ref 70–99)
Potassium: 3.9 mmol/L (ref 3.5–5.1)
Sodium: 136 mmol/L (ref 135–145)
Total Bilirubin: 0.6 mg/dL (ref 0.3–1.2)
Total Protein: 5.2 g/dL — ABNORMAL LOW (ref 6.5–8.1)

## 2022-08-04 LAB — CBC WITH DIFFERENTIAL/PLATELET
Abs Immature Granulocytes: 0 10*3/uL (ref 0.00–0.07)
Basophils Absolute: 0 10*3/uL (ref 0.0–0.1)
Basophils Relative: 0 %
Eosinophils Absolute: 0 10*3/uL (ref 0.0–0.5)
Eosinophils Relative: 0 %
HCT: 26.4 % — ABNORMAL LOW (ref 36.0–46.0)
Hemoglobin: 8.5 g/dL — ABNORMAL LOW (ref 12.0–15.0)
Lymphocytes Relative: 72 %
Lymphs Abs: 0.5 10*3/uL — ABNORMAL LOW (ref 0.7–4.0)
MCH: 30.9 pg (ref 26.0–34.0)
MCHC: 32.2 g/dL (ref 30.0–36.0)
MCV: 96 fL (ref 80.0–100.0)
Metamyelocytes Relative: 2 %
Monocytes Absolute: 0.1 10*3/uL (ref 0.1–1.0)
Monocytes Relative: 13 %
Myelocytes: 2 %
Neutro Abs: 0.1 10*3/uL — CL (ref 1.7–7.7)
Neutrophils Relative %: 11 %
Platelets: 138 10*3/uL — ABNORMAL LOW (ref 150–400)
RBC: 2.75 MIL/uL — ABNORMAL LOW (ref 3.87–5.11)
RDW: 17.9 % — ABNORMAL HIGH (ref 11.5–15.5)
WBC: 0.7 10*3/uL — CL (ref 4.0–10.5)
nRBC: 0 % (ref 0.0–0.2)

## 2022-08-04 LAB — URINALYSIS, ROUTINE W REFLEX MICROSCOPIC
Bilirubin Urine: NEGATIVE
Glucose, UA: NEGATIVE mg/dL
Hgb urine dipstick: NEGATIVE
Ketones, ur: NEGATIVE mg/dL
Leukocytes,Ua: NEGATIVE
Nitrite: NEGATIVE
Protein, ur: 100 mg/dL — AB
Specific Gravity, Urine: 1.009 (ref 1.005–1.030)
pH: 7 (ref 5.0–8.0)

## 2022-08-04 LAB — RETICULOCYTES
Immature Retic Fract: 16.8 % — ABNORMAL HIGH (ref 2.3–15.9)
RBC.: 2.74 MIL/uL — ABNORMAL LOW (ref 3.87–5.11)
Retic Count, Absolute: 65.5 10*3/uL (ref 19.0–186.0)
Retic Ct Pct: 2.4 % (ref 0.4–3.1)

## 2022-08-04 LAB — BRAIN NATRIURETIC PEPTIDE: B Natriuretic Peptide: 249.3 pg/mL — ABNORMAL HIGH (ref 0.0–100.0)

## 2022-08-04 LAB — PROTIME-INR
INR: 2.4 — ABNORMAL HIGH (ref 0.8–1.2)
Prothrombin Time: 26.2 seconds — ABNORMAL HIGH (ref 11.4–15.2)

## 2022-08-04 LAB — LACTATE DEHYDROGENASE: LDH: 192 U/L (ref 98–192)

## 2022-08-04 MED ORDER — FUROSEMIDE 10 MG/ML IJ SOLN
80.0000 mg | Freq: Once | INTRAMUSCULAR | Status: AC
  Administered 2022-08-04: 80 mg via INTRAVENOUS
  Filled 2022-08-04: qty 8

## 2022-08-04 MED ORDER — CALCIUM CARBONATE ANTACID 500 MG PO CHEW: 1.0000 | CHEWABLE_TABLET | Freq: Once | ORAL | Status: DC

## 2022-08-04 NOTE — Discharge Instructions (Addendum)
Increase your Lasix/furosemide to 80 mg twice per day for the next 3 days.  Call your doctor on Monday, 10/30, for further adjustments as needed.  Your white blood cells are very low on lab work today.  For now, no further treatments are needed but you should be calling Dr. Chryl Heck on Monday, 10/30.   However, if you develop fever, other signs of infection, or any other new/concerning symptoms then return to the ER for evaluation or call 911.

## 2022-08-04 NOTE — Telephone Encounter (Signed)
Critical labs received from [facility] ([facility] lab).  WBC 0.7, ANC 0.1.  Notified provider's RN of results.  CRITICAL VALUE STICKER  CRITICAL VALUE: ANC 0.1, WBC 0.7  RECEIVER (on-site recipient of call): Dow Adolph, [place] NOTIFIED: 08/04/22 1142  MESSENGER (representative from lab): Kristeen Miss  MD NOTIFIED: notified MD's RN Hinda Lenis  TIME OF NOTIFICATION: 08/04/22 1154

## 2022-08-04 NOTE — ED Notes (Signed)
An After Visit Summary was printed and given to the patient. Discharge instructions given and no further questions at this time.  Pt leaving with family. Pt assisted to pts family car with staff. Pt ambulatory with walker at baseline.

## 2022-08-04 NOTE — ED Provider Triage Note (Signed)
Emergency Medicine Provider Triage Evaluation Note  [name] , a 81 y.o. female  was evaluated in triage.  Pt complains of abmormal labs.  Patient was seen at the cancer center on Tuesday for her vasculitis.  Had normal labs at that time, but were not extremity patient.  She denies an appointment today and had redraw her labs.  States she has no white blood cells and presents to the ED and that she will need to be admitted for further treatment and.  Patient currently complaining of tiredness in the mornings, but otherwise asymptomatic.  Review of Systems  Positive: As above Negative: Fevers, chills, chest pain, shortness of breath, dizziness, lightheadedness  Physical Exam  BP (!) 132/46 (BP Location: Left Arm)   Pulse 84   Temp 98.8 F (37.1 C) (Oral)   Resp 16   SpO2 100%  Gen:   Awake, no distress   Resp:  Normal effort  MSK:   Moves extremities without difficulty  Other:    Medical Decision Making  Medically screening exam initiated at 3:10 PM.  Appropriate orders placed.  [name] was informed that the remainder of the evaluation will be completed by another provider, this initial triage assessment does not replace that evaluation, and the importance of remaining in the ED until their evaluation is complete.  Labs obtained at cancer center today.  We will also obtain chest x-ray and EKG   Roylene Reason, Hershal Coria 08/04/22 1512

## 2022-08-04 NOTE — Telephone Encounter (Signed)
This RN received panic lab values called to Charge nurse in infusion room- with noted WBC at 0.7 and ANC now down to 0.1.  Pt has noted drop in Calcium from 8.8 on 10/25 to 7.9 today with worsening kidney functions.  This Rn called pt to see how she is currently feeling with pt stating " I don't know...just don't feel right" But she could not state exact issues.  Per review with providers - end of discussion is patient likely needs greater support then what can be done in the clinic setting with best recommendation for pt to proceed to the ER. [facility] provider will call report to them if pt is in agreement to plan.  This RN spoke again to pt and to her daughter stating concern relating severity of low white count as well as other abnormal labs and due to possible life threatening sequela recommendation is to proceed to the ER.  Daughter verbalized understanding and both Charese and her are in agreement- with daughter stating " I will get her ready and get her there [facility] ER) in the hour."  This note will be forwarded to primary provider for review of communication.

## 2022-08-04 NOTE — ED Provider Notes (Signed)
[facility] DEPT Provider Note   CSN: [id number] Arrival date & time: 08/04/22  1429     History  Chief Complaint  Patient presents with   Abnormal Lab    [name] is a 81 y.o. female.  HPI 81 year old female with a history of autoimmune hemolytic anemia, diastolic congestive heart failure, type 2 diabetes, aortic valve replacement on warfarin, and multiple other comorbidities presents with low white blood cell count.  She was called by the oncology office due to neutropenia as well as hypocalcemia.  She was told to come to the ER.  She has chronic fatigue and states is probably a little worse over the last few days.  She used to be on Retacrit for her condition, about 6 months ago this was stopped and she was changed to steroids.  She has been tapering these over the last few days.  She is due to start Retacrit again and had her first dose 3 days ago.  She specifically denies any fevers or obvious infectious symptoms.  She has had a cough though that is about the same as when she was treated for pneumonia at her nursing home that she just got released from.  She finished a full course of antibiotics.  However she has noticed a little bit of worse dyspnea on exertion and her legs are now weeping.  She had chronic leg swelling for about 6 months and is on furosemide.  No chest pain.  Has chronic headaches but no new headache.  No obvious skin infection.   Home Medications Prior to Admission medications   Medication Sig Start Date End Date Taking? Authorizing Provider  acetaminophen (TYLENOL) 325 MG tablet Take 325 mg by mouth every 6 (six) hours as needed for mild pain or fever.    [provider]  alendronate (FOSAMAX) 70 MG tablet Take 70 mg by mouth once a week. Saturdays 12/06/21   [provider]  allopurinol (ZYLOPRIM) 100 MG tablet Take 100 mg by mouth daily.    [provider]  ALPRAZolam Duanne Moron) 0.5 MG tablet Take 1  tablet (0.5 mg total) by mouth at bedtime. 07/14/22   Sheikh, Omair Latif, DO  amLODipine (NORVASC) 10 MG tablet TAKE 1 TABLET EVERY DAY Patient taking differently: Take 10 mg by mouth daily. 05/17/22   [name], MD  atorvastatin (LIPITOR) 80 MG tablet Take 1 tablet (80 mg total) by mouth daily at 6 PM. Patient taking differently: Take 80 mg by mouth daily. 11/22/18   [name], MD  Cholecalciferol (VITAMIN D3) 50 MCG (2000 UT) capsule Take 2,000 Units by mouth daily with lunch. 11/26/18   [provider]  cholestyramine Lucrezia Starch) 4 GM/DOSE powder Take 4 g by mouth daily after supper. 06/07/22   [provider]  cloNIDine (CATAPRES) 0.2 MG tablet TAKE 1 TABLET TWICE DAILY Patient taking differently: Take 0.2 mg by mouth 2 (two) times daily. 06/22/21   [name], MD  Cyanocobalamin (VITAMIN B12) 1000 MCG TBCR Take 1,000 mcg by mouth daily with lunch.    [provider]  estradiol (ESTRACE) 0.1 MG/GM vaginal cream Place 0.5g nightly for two weeks then twice a week after Patient taking differently: Place 1 Applicatorful vaginally See admin instructions. Place 0.5g nightly for two weeks then twice a week afterwards. 09/19/21   Jaquita Folds, MD  famotidine (PEPCID) 20 MG tablet Take 1 tablet (20 mg total) by mouth 2 (two) times daily. 04/10/22   Benay Pike,  MD  fluconazole (DIFLUCAN) 100 MG tablet Take 1 tablet (100 mg total) by mouth daily. Patient not taking: Reported on 07/26/2022 07/15/22   Raiford Noble Latif, DO  folic acid (FOLVITE) 1 MG tablet Take 1 mg by mouth daily.    [provider]  furosemide (LASIX) 20 MG tablet Take 60-100 mg by mouth See admin instructions. Two entries on MAR for furosemide '20mg'$ . Take 3 tablets ('60mg'$ ) by mouth every evening. Take 5 tablets ('100mg'$ ) once a day. 07/14/22   [provider]  furosemide (LASIX) 40 MG tablet TAKE 2 TABLETS (80 MG TOTAL) IN MORNING AND 1 AND 1/2 TABLETS (60 MG TOTAL) IN  EVENING. CHANGE IN DOSAGE. Patient not taking: Reported on 07/26/2022 03/22/22   [name] D, NP  hydrALAZINE (APRESOLINE) 100 MG tablet TAKE 1 TABLET THREE TIMES DAILY Patient taking differently: Take 100 mg by mouth 3 (three) times daily. 02/17/22   [name], MD  HYDROmorphone HCl (DILAUDID) 1 MG/ML LIQD Take 1-2 mLs (1-2 mg total) by mouth every 6 (six) hours as needed for severe pain or moderate pain (use oral medication prior to giving IV). Patient taking differently: Take 1 mL by mouth every 6 (six) hours as needed for severe pain or moderate pain (use oral medication prior to giving IV). 07/14/22   Sheikh, Georgina Quint Latif, DO  insulin aspart (NOVOLOG) 100 UNIT/ML injection Take 2 units for blood sugar 200-250, 4 units for blood sugar 252-300, 6 units for blood sugar 301-350, 8 units for blood sugar greater than 350 and call MD. Patient taking differently: Inject 2-8 Units into the skin 3 (three) times daily with meals. Per sliding scale, If blood sugar is 200 to 250, give 2 units. If blood sugar is 251 to 300, give 4 units. If blood sugar is 301 to 350, give 6 units. If blood sugar is greater than 350, give 8 units and call MD. 02/23/22   Gardenia Phlegm, NP  insulin glargine-yfgn (SEMGLEE) 100 UNIT/ML injection Inject 0.1 mLs (10 Units total) into the skin daily. Patient taking differently: Inject 0.1 mLs into the skin daily. 07/15/22   Sheikh, Georgina Quint Latif, DO  Insulin Syringe-Needle U-100 (B-D INS SYR ULTRAFINE .3CC/29G) 29G X 1/2" 0.3 ML MISC Use for sliding scale for breakthrough high blood sugar readings 02/24/22   Iruku, Arletha Pili, MD  lidocaine (XYLOCAINE) 2 % solution Use as directed 15 mLs in the mouth or throat as needed for mouth pain. Patient taking differently: Use as directed 15 mLs in the mouth or throat 2 (two) times daily as needed for mouth pain. 01/30/22   Benay Pike, MD  loperamide (IMODIUM A-D) 2 MG tablet Take 2 mg by mouth daily.    [provider]   Menthol, Topical Analgesic, (BIOFREEZE) 4 % GEL Apply 1 Application topically 3 (three) times daily. 1 application topically to lower back and BLE    [provider]  metoprolol succinate (TOPROL-XL) 25 MG 24 hr tablet TAKE 1/2 TABLET EVERY DAY Patient taking differently: Take 12.5 mg by mouth daily. 07/19/21   [name], MD  mirabegron ER (MYRBETRIQ) 25 MG TB24 tablet Take 1 tablet (25 mg total) by mouth daily. 04/17/22   Daleen Bo, MD  nystatin-alum & mag hydroxide-simeth-diphenhydrAMINE Swish 5 mLs by mouth 3 times daily then spit 05/15/22   Walisiewicz, Verline Lema E, PA-C  ondansetron (ZOFRAN) 4 MG tablet Take 1 tablet (4 mg total) by mouth every 6 (six) hours as needed for nausea. Patient not taking: Reported  on 07/26/2022 07/14/22   Raiford Noble Latif, DO  ondansetron (ZOFRAN-ODT) 4 MG disintegrating tablet Take 4 mg by mouth every 6 (six) hours as needed for nausea. 07/14/22   [provider]  pantoprazole (PROTONIX) 40 MG tablet Take 40 mg by mouth 2 (two) times daily. 06/25/22   [provider]  predniSONE (DELTASONE) 20 MG tablet Take 20 mg by mouth daily. 07/17/22   [provider]  saccharomyces boulardii (FLORASTOR) 250 MG capsule Take 250 mg by mouth 2 (two) times daily.    [provider]  simethicone (GAS-X) 80 MG chewable tablet Chew 1 tablet (80 mg total) by mouth 4 (four) times daily as needed for flatulence. Patient not taking: Reported on 07/26/2022 04/16/22 04/16/23  Edwin Dada, MD  spironolactone (ALDACTONE) 25 MG tablet TAKE 1 TABLET EVERY DAY Patient taking differently: Take 25 mg by mouth daily. 05/17/22   [name], MD  tadalafil, PAH, (ADCIRCA) 20 MG tablet Take 2 tablets (40 mg total) by mouth daily. Patient taking differently: Take 40 mg by mouth at bedtime. 12/06/21   [name], MD  vitamin C (ASCORBIC ACID) 500 MG tablet Take 500 mg by mouth daily with lunch.    [provider]  warfarin  (COUMADIN) 5 MG tablet TAKE 1 TO 1 AND 1/2 TABLETS DAILY AS DIRECTED BY COUMADIN CLINIC Patient not taking: Reported on 07/26/2022 07/17/22   Lorretta Harp, MD      Allergies    Flagyl [metronidazole], Augmentin [amoxicillin-pot clavulanate], Ciprofloxacin, Coreg [carvedilol], Diflucan [fluconazole], Losartan, Privigen [immune globulin (human)], Sulfamethoxazole-trimethoprim, Verapamil, Zetia [ezetimibe], Zocor [simvastatin - high dose], and Gatifloxacin    Review of Systems   Review of Systems  Constitutional:  Positive for fatigue. Negative for fever.  Respiratory:  Positive for cough and shortness of breath.   Cardiovascular:  Positive for leg swelling. Negative for chest pain.  Gastrointestinal:  Negative for abdominal pain.  Genitourinary:  Negative for dysuria.  Neurological:  Positive for headaches.    Physical Exam Updated Vital Signs BP 131/86   Pulse 80   Temp 98.8 F (37.1 C) (Oral)   Resp 20   SpO2 99%  Physical Exam Vitals and nursing note reviewed.  Constitutional:      Appearance: She is well-developed.  HENT:     Head: Normocephalic and atraumatic.     Comments: Healing occipital wound/bruising Cardiovascular:     Rate and Rhythm: Normal rate and regular rhythm.     Heart sounds: Murmur (click from mechanical valve) heard.  Pulmonary:     Effort: Pulmonary effort is normal.     Breath sounds: Rales (mild, bases) present.  Abdominal:     General: There is no distension.     Palpations: Abdomen is soft.     Tenderness: There is no abdominal tenderness.  Musculoskeletal:     Right lower leg: Edema present.     Left lower leg: Edema present.     Comments: Bilateral lower extremity edema  Skin:    General: Skin is warm and dry.     Comments: Healing abrasion to right elbow. No cellulitis  Neurological:     Mental Status: She is alert.     ED Results / Procedures / Treatments   Labs (all labs ordered are listed, but only abnormal results are  displayed) Labs Reviewed  PROTIME-INR - Abnormal; Notable for the following components:      Result Value   Prothrombin Time 26.2 (*)  INR 2.4 (*)    All other components within normal limits  URINALYSIS, ROUTINE W REFLEX MICROSCOPIC - Abnormal; Notable for the following components:   APPearance HAZY (*)    Protein, ur 100 (*)    Bacteria, UA RARE (*)    All other components within normal limits  CULTURE, BLOOD (ROUTINE X 2)  CULTURE, BLOOD (ROUTINE X 2)  URINE CULTURE  BRAIN NATRIURETIC PEPTIDE    EKG EKG Interpretation  Date/Time:  Friday August 04 2022 16:45:15 EDT Ventricular Rate:  82 PR Interval:    QRS Duration: 102 QT Interval:  380 QTC Calculation: 444 R Axis:   129 Text Interpretation: Atrial fibrillation Probable lateral infarct, old Anteroseptal infarct, old no significant change since Sept 2023 Confirmed by Sherwood Gambler [phone]) on 08/04/2022 5:03:58 PM  Radiology DG Chest 2 View  Result Date: 08/04/2022 CLINICAL DATA:  Medical clearance, low white blood cell count EXAM: CHEST - 2 VIEW COMPARISON:  07/26/2022 FINDINGS: Frontal and lateral views of the chest demonstrates stable enlargement of the cardiac silhouette. Postsurgical changes from median sternotomy. The lungs are hyperinflated with background interstitial prominence consistent with emphysema. Small bilateral pleural effusions are noted. No airspace disease or pneumothorax. Stable bilateral granulomas. No acute bony abnormality. IMPRESSION: 1. Small bilateral pleural effusions. 2. Stable enlarged cardiac silhouette. 3. Stable emphysema. Electronically Signed   By: Randa Ngo M.D.   On: 08/04/2022 15:31    Procedures Procedures    Medications Ordered in ED Medications  calcium carbonate (TUMS - dosed in mg elemental calcium) chewable tablet 200 mg of elemental calcium (has no administration in time range)  furosemide (LASIX) injection 80 mg (80 mg Intravenous Given 08/04/22 1718)    ED  Course/ Medical Decision Making/ A&P                           Medical Decision Making Amount and/or Complexity of Data Reviewed External Data Reviewed: labs and notes.    Details: Labs from earlier in the day reviewed and patient has neutropenia.  She does have a low calcium but when corrected for her albumin it is 8.8, just under normal limits.  Chronic kidney disease and anemia are stable. Labs: ordered.    Details: INR 2.4.  UA not consistent with UTI. Radiology: ordered and independent interpretation performed.    Details: Mild pleural effusions on CXR ECG/medicine tests: ordered and independent interpretation performed.    Details: No acute ischemia  Risk OTC drugs. Prescription drug management.   Patient is overall well-appearing.  Vital signs normal including normal O2 sats.  Patient appears to have a couple issues going on.  I suspect her slightly worse fatigue than baseline is probably related to some extra fluid.  She does have history of diastolic CHF on chart review.  For this she was given a dose of IV Lasix, 80 mg.  She is not hypoxic or having increased work of breathing currently.  She feels well enough to go home and we will adjust her Lasix a little bit and have her follow-up with her PCP.  As for her neutropenia, there is no evidence of an infection on exam or history.  I discussed with Dr. Alvy Bimler, he notes patient did not need to come to the ER as she is not having infection symptoms and this could be managed as an outpatient.  No indication for any type of other medicines, but does recommend blood and urine cultures as well  as looking for source with urine and chest x-ray.  Chest x-ray seems to be more of a fluid problem than pneumonia.  UA is unremarkable from an infection standpoint.  No cellulitis on exam.  Benign abdominal exam.  Thus will discharge home and have her follow-up with her hematologist-oncologist by calling them on 10/30 (Monday).  Return precautions  discussed with patient and family.        Final Clinical Impression(s) / ED Diagnoses Final diagnoses:  Neutropenia, unspecified type ([place])  Acute on chronic diastolic congestive heart failure [facility])    Rx / DC Orders ED Discharge Orders     None         Sherwood Gambler, MD 08/04/22 [phone]

## 2022-08-04 NOTE — ED Triage Notes (Signed)
Pt arrived via POV with family, states was called by cancer center today for concern WBC and calcium levels.

## 2022-08-05 LAB — URINE CULTURE: Culture: 60000 — AB

## 2022-08-07 ENCOUNTER — Telehealth: Payer: Self-pay | Admitting: *Deleted

## 2022-08-07 ENCOUNTER — Encounter: Payer: Self-pay | Admitting: Hematology and Oncology

## 2022-08-07 ENCOUNTER — Other Ambulatory Visit: Payer: Self-pay

## 2022-08-07 ENCOUNTER — Inpatient Hospital Stay: Payer: Medicare HMO

## 2022-08-07 ENCOUNTER — Inpatient Hospital Stay (HOSPITAL_BASED_OUTPATIENT_CLINIC_OR_DEPARTMENT_OTHER): Payer: Medicare HMO | Admitting: Hematology and Oncology

## 2022-08-07 VITALS — BP 143/62 | HR 82 | Temp 97.9°F | Resp 16 | Ht 60.0 in | Wt 142.5 lb

## 2022-08-07 DIAGNOSIS — D594 Other nonautoimmune hemolytic anemias: Secondary | ICD-10-CM

## 2022-08-07 DIAGNOSIS — D591 Autoimmune hemolytic anemia, unspecified: Secondary | ICD-10-CM | POA: Diagnosis not present

## 2022-08-07 DIAGNOSIS — K922 Gastrointestinal hemorrhage, unspecified: Secondary | ICD-10-CM | POA: Diagnosis not present

## 2022-08-07 DIAGNOSIS — R7402 Elevation of levels of lactic acid dehydrogenase (LDH): Secondary | ICD-10-CM | POA: Diagnosis not present

## 2022-08-07 DIAGNOSIS — N184 Chronic kidney disease, stage 4 (severe): Secondary | ICD-10-CM | POA: Diagnosis not present

## 2022-08-07 DIAGNOSIS — R600 Localized edema: Secondary | ICD-10-CM | POA: Diagnosis not present

## 2022-08-07 DIAGNOSIS — R197 Diarrhea, unspecified: Secondary | ICD-10-CM | POA: Diagnosis not present

## 2022-08-07 DIAGNOSIS — M8580 Other specified disorders of bone density and structure, unspecified site: Secondary | ICD-10-CM | POA: Diagnosis not present

## 2022-08-07 DIAGNOSIS — I480 Paroxysmal atrial fibrillation: Secondary | ICD-10-CM | POA: Diagnosis not present

## 2022-08-07 DIAGNOSIS — D631 Anemia in chronic kidney disease: Secondary | ICD-10-CM | POA: Diagnosis not present

## 2022-08-07 DIAGNOSIS — I7782 Antineutrophilic cytoplasmic antibody (ANCA) vasculitis: Secondary | ICD-10-CM | POA: Diagnosis not present

## 2022-08-07 LAB — COMPREHENSIVE METABOLIC PANEL
ALT: 11 U/L (ref 0–44)
AST: 16 U/L (ref 15–41)
Albumin: 3.1 g/dL — ABNORMAL LOW (ref 3.5–5.0)
Alkaline Phosphatase: 86 U/L (ref 38–126)
Anion gap: 7 (ref 5–15)
BUN: 49 mg/dL — ABNORMAL HIGH (ref 8–23)
CO2: 26 mmol/L (ref 22–32)
Calcium: 8.2 mg/dL — ABNORMAL LOW (ref 8.9–10.3)
Chloride: 103 mmol/L (ref 98–111)
Creatinine, Ser: 2.02 mg/dL — ABNORMAL HIGH (ref 0.44–1.00)
GFR, Estimated: 24 mL/min — ABNORMAL LOW (ref >60)
Glucose, Bld: 149 mg/dL — ABNORMAL HIGH (ref 70–99)
Potassium: 3.9 mmol/L (ref 3.5–5.1)
Sodium: 136 mmol/L (ref 135–145)
Total Bilirubin: 0.5 mg/dL (ref 0.3–1.2)
Total Protein: 5.2 g/dL — ABNORMAL LOW (ref 6.5–8.1)

## 2022-08-07 LAB — CBC WITH DIFFERENTIAL/PLATELET
Abs Immature Granulocytes: 0.06 10*3/uL (ref 0.00–0.07)
Basophils Absolute: 0 10*3/uL (ref 0.0–0.1)
Basophils Relative: 1 %
Eosinophils Absolute: 0 10*3/uL (ref 0.0–0.5)
Eosinophils Relative: 1 %
HCT: 27.5 % — ABNORMAL LOW (ref 36.0–46.0)
Hemoglobin: 9 g/dL — ABNORMAL LOW (ref 12.0–15.0)
Immature Granulocytes: 3 %
Lymphocytes Relative: 21 %
Lymphs Abs: 0.5 10*3/uL — ABNORMAL LOW (ref 0.7–4.0)
MCH: 31.4 pg (ref 26.0–34.0)
MCHC: 32.7 g/dL (ref 30.0–36.0)
MCV: 95.8 fL (ref 80.0–100.0)
Monocytes Absolute: 0.3 10*3/uL (ref 0.1–1.0)
Monocytes Relative: 11 %
Neutro Abs: 1.5 10*3/uL — ABNORMAL LOW (ref 1.7–7.7)
Neutrophils Relative %: 63 %
Platelets: 146 10*3/uL — ABNORMAL LOW (ref 150–400)
RBC: 2.87 MIL/uL — ABNORMAL LOW (ref 3.87–5.11)
RDW: 18.1 % — ABNORMAL HIGH (ref 11.5–15.5)
Smear Review: NORMAL
WBC: 2.3 10*3/uL — ABNORMAL LOW (ref 4.0–10.5)
nRBC: 0 % (ref 0.0–0.2)

## 2022-08-07 LAB — RETIC PANEL
Immature Retic Fract: 25.1 % — ABNORMAL HIGH (ref 2.3–15.9)
RBC.: 2.87 MIL/uL — ABNORMAL LOW (ref 3.87–5.11)
Retic Count, Absolute: 174.8 10*3/uL (ref 19.0–186.0)
Retic Ct Pct: 6.1 % — ABNORMAL HIGH (ref 0.4–3.1)
Reticulocyte Hemoglobin: 28.2 pg (ref >27.9)

## 2022-08-07 LAB — PROTIME-INR
INR: 3.2 — ABNORMAL HIGH (ref 0.8–1.2)
Prothrombin Time: 32.6 seconds — ABNORMAL HIGH (ref 11.4–15.2)

## 2022-08-07 LAB — SAMPLE TO BLOOD BANK

## 2022-08-07 NOTE — Progress Notes (Signed)
[facility] Follow up:    [name], [name], MD [place] [address]   DIAGNOSIS: Hemolytic Anemia  SUMMARY OF HEMATOLOGIC HISTORY: [name] is a 81 y.o. [place] woman with multifactorial anemia, as follows:             (a) anemia of chronic inflammation: As of May 2020 she has a SED rate of 95, CRP 1.1, positive ANA and RF; subsequently she was found to be ANCA positive             (b) hemolysis (mild): she has a mildly elevated LDH and a DAT positive for complement, not IgG; this is c/w (a) above             (c) anemia of renal insufficiency: inadequate EPO resonse to anemia                         (d) GIB/ iron deficiency in patient on lifelong anticoagulaton   (1) Feraheme received 03/01/2019, repeated 03/20/2019              (a) reticulocyte 03/07/2019 up from 43.4 a year ago to 191.8 after iron infusion             (b) feraheme repeated on 05/12/2019             (c) subsequent ferritin levels >100   (2) darbepoetin: starting 05/05/2019 at 168mg given every 2 weeks             (a) dose increased to 200 mcg every week starting 07/01/2019              (b) back to every 2 weeks beginning 08/04/2019 still at 200 mcg dose             (c) switched to Retacrit every 4 weeks as of 08/18/2019   (3) ANCA positive vasculitis: diagnosed 05/08/2019 (titer 1:640)             (a) prednisone 40 mg daily started 05/31/2019             (b) rituximab weekly started 06/12/2019 (received test dose 06/05/2019)                         (i) hepatitis B sAg, core Ab and HIV negative 05/08/2019                         (ii) rituximab held after 07/14/2019 dose (received 5 weekly doses) with neutropenia                         (iii) rituximab resumed 08/18/2019 but changed to monthly                         (iv) rituximab changed to every 2 months beginning with 02/02/2020 dose                         (v) rituximab changed to every 12 weeks after the  10/12/2020 dose             (c) prednisone tapered to off as of 09/15/2019                          (4) Anemia worsened in spring 2023,  refractory to Rituximab and requiring prednisone restart with slow taper due currently at 81m which she will stay at.   (5) osteopenia with a T score of -1.5 on bone density 02/28/2016 at the [facility] center  CURRENT THERAPY: Prednisone 10 mg   INTERVAL HISTORY:  [name] BARDON89y.o. female returns for follow-up of her hemolytic anemia.   Ms. BDeitrickis here today for interim visit with her son.  We got a phone call from her daughter who mentioned that she has been laying in bed and not feeling well.  However Ms. Disbrow today tells me that she feels pretty much the same.  She may have felt a bit tired over the weekend and hence rested more.  She otherwise continues to have lower extremity swelling issues.  She is on a prednisone taper dose at 10 mg currently.  She denies any fevers or chills. Rest of the pertinent 10 point ROS reviewed and negative  Patient Active Problem List   Diagnosis Date Noted   Palliative care encounter    Goals of care, counseling/discussion    Counseling and coordination of care    GI bleed 07/09/2022   GI bleeding 07/08/2022   Acute on chronic anemia 07/08/2022   History of Clostridioides difficile colitis 07/08/2022   DNR (do not resuscitate)/DNI(Do Not Intubate) 07/08/2022   Anemia of chronic disease 07/07/2022   Pressure injury of skin 06/23/2022   UTI (urinary tract infection) 06/22/2022   Joint pain 06/09/2022   Parietoalveolar pneumopathy ([facility] 06/09/2022   Primary osteoarthritis 06/09/2022   Rheumatoid factor positive 06/09/2022   C. difficile colitis 04/19/2022   Colitis 04/18/2022   PAH (pulmonary artery hypertension) ([facility] 04/18/2022   Emphysematous cystitis 04/13/2022   Female bladder prolapse 04/13/2022   Diverticulitis 04/12/2022   Autoimmune hemolytic anemia ([facility] 01/02/2022   Bilateral lower extremity  edema 01/02/2022   Refractory anemia (HCC) 12/15/2021   Abnormal chest x-ray 12/15/2021   Supratherapeutic INR 12/15/2021   Elevated troponin 12/15/2021   Unilateral primary osteoarthritis, right hip 05/31/2021   Midline cystocele 05/30/2021   Primary localized osteoarthritis of pelvic region and thigh 05/30/2021   Vaginal pain 05/30/2021   Asymmetric SNHL (sensorineural hearing loss) 01/06/2021   Referred otalgia of both ears 01/06/2021   Bilateral hearing loss 08/31/2020   Low back pain 02/20/2020   CKD (chronic kidney disease), stage IV ([facility] 10/13/2019   Leukopenia due to antineoplastic chemotherapy ([facility] 08/18/2019   Thrombophilia ([facility] 06/19/2019   Encounter for general adult medical examination without abnormal findings 06/13/2019   Arteritis ([facility] 06/06/2019   Hypertensive heart and chronic kidney disease with heart failure and stage 1 through stage 4 chronic kidney disease, or unspecified chronic kidney disease ([facility] 06/06/2019   Muscle weakness 06/06/2019   Pancytopenia ([facility] 05/28/2019   Dyspnea 05/28/2019   Vasculitis, ANCA positive 05/28/2019   Disorder of connective tissue ([facility] 05/14/2019   Renal insufficiency 05/08/2019   PAF (paroxysmal atrial fibrillation) ([place] 04/07/2019   Iron deficiency anemia 02/28/2019   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 ([facility] [place] 02/28/2019   Diabetic retinopathy associated with type 2 diabetes mellitus (H[place] 09/02/2018   Chronic pain 06/19/2018   Non-thrombocytopenic purpura (HS[place] 05/21/2018   Gout 03/22/2018   Malnutrition of moderate degree 03/12/2018   Diabetes mellitus type 2, controlled (HM[facility] 03/06/2018   H/O mechanical aortic valve replacement 03/06/2018   Moderate to severe pulmonary hypertension ([facility] 03/06/2018   GERD (gastroesophageal reflux disease) 02/21/2018   Anxiety 02/21/2018   Chronic diastolic  CHF (congestive heart failure) ([place]) 02/21/2018   General weakness 02/02/2018   Thrombocytopenia  ([place]) 01/25/2018   Arm pain, diffuse 01/25/2018   Anemia 01/23/2018   Aortic atherosclerosis ([place]) 01/23/2018   Age-related osteoporosis without current pathological fracture 05/24/2017   Carotid artery occlusion 05/24/2017   Generalized anxiety disorder 05/24/2017   Hearing loss 05/24/2017   Presence of prosthetic heart valve 05/24/2017   Carotid artery disease ([place]) 08/18/2013   Peripheral arterial disease ([place]) 08/18/2013   Diabetes mellitus without complication ([place]) 92/33/0076   Hypertension    Hypercholesteremia    Mechanical heart valve present    Chronic anticoagulation     is allergic to flagyl [metronidazole], augmentin [amoxicillin-pot clavulanate], ciprofloxacin, coreg [carvedilol], diflucan [fluconazole], losartan, privigen [immune globulin (human)], sulfamethoxazole-trimethoprim, verapamil, zetia [ezetimibe], zocor [simvastatin - high dose], and gatifloxacin.  MEDICAL HISTORY: Past Medical History:  Diagnosis Date   Aortic atherosclerosis ([place]) 01/23/2018   Atrial fibrillation (HCC)    Benign positional vertigo 10/06/2011   Carotid artery disease (HCC)    Chemotherapy induced neutropenia ([place]) 07/21/2019   Cholelithiasis 01/23/2018   Cholelithiasis 01/23/2018   Chronic anticoagulation    Colovesical fistula, ruled out 04/14/2022   Coronary artery disease    status post coronary artery bypass grafting times 07/10/2004   Diabetes mellitus    Hypercholesteremia    Hypertension    Mechanical heart valve present    H. aortic valve replacement at the time of bypass surgery October 2005   Moderate to severe pulmonary hypertension ([place])    Peripheral arterial disease ([place])    history of left common iliac artery PTA and stenting for a chronic total occlusion 08/26/01   S/P cholecystectomy 03/06/2018    SURGICAL HISTORY: Past Surgical History:  Procedure Laterality Date   anterior repair  2009   AORTIC VALVE REPLACEMENT  2005   Dr. Cyndia Bent   CARDIAC CATHETERIZATION   11/10/2004   40% right common illiac, 70% in stent restenosis of distal left common illiac,    CARDIAC CATHETERIZATION  05/18/2004   LAD 50-70% midstenosis, RCA dominant w/50% stenosis, 50% Right common Illiac artery ostial stenosis, 90% in stent restenosis within midportion of left common illiac stent   Carotid Duplex  03/12/2012   RSA-elev. velocities suggestive of a 50-69% diameter reduction, Right&Left Bulb/Prox ICA-mild-mod.fibrous plaqueelevating Velocities abnormal study.   CHOLECYSTECTOMY N/A 03/01/2018   Procedure: LAPAROSCOPIC CHOLECYSTECTOMY;  Surgeon: Judeth Horn, MD;  Location: [facility];  Service: General;  Laterality: N/A;   COLONOSCOPY WITH PROPOFOL N/A 01/22/2018   Procedure: COLONOSCOPY WITH PROPOFOL;  Surgeon: Wilford Corner, MD;  Location: [facility];  Service: Endoscopy;  Laterality: N/A;   ESOPHAGOGASTRODUODENOSCOPY N/A 07/13/2022   Procedure: ESOPHAGOGASTRODUODENOSCOPY (EGD);  Surgeon: [name], MD;  [facility];  Service: Gastroenterology;  Laterality: N/A;   ESOPHAGOGASTRODUODENOSCOPY (EGD) WITH PROPOFOL N/A 01/22/2018   Procedure: ESOPHAGOGASTRODUODENOSCOPY (EGD) WITH PROPOFOL;  Surgeon: Wilford Corner, MD;  Location: [facility];  Service: Endoscopy;  Laterality: N/A;   ESOPHAGOGASTRODUODENOSCOPY (EGD) WITH PROPOFOL N/A 11/21/2018   Procedure: ESOPHAGOGASTRODUODENOSCOPY (EGD) WITH PROPOFOL;  Surgeon: [name], MD;  Location: [facility];  Service: Gastroenterology;  Laterality: N/A;   ESOPHAGOGASTRODUODENOSCOPY (EGD) WITH PROPOFOL Left 02/27/2019   Procedure: ESOPHAGOGASTRODUODENOSCOPY (EGD) WITH PROPOFOL;  Surgeon: Arta Silence, MD;  [facility];  Service: Endoscopy;  Laterality: Left;   GIVENS CAPSULE STUDY N/A 11/21/2018   Procedure: GIVENS CAPSULE STUDY;  Surgeon: [name], MD;  Location: [facility];  Service: Gastroenterology;  Laterality: N/A;  To be deployed during EGD  Lower Ext. Duplex  03/12/2012   Right Proximal CIA-  vessel narrowing w/elevated velocities 0-49% diameter reduction. Right SFA-mild mixed density plaque throughout vessel.   NM MYOCAR PERF WALL MOTION  05/19/2010   protocol: Persantine, post stress EF 65%, negative for ischemia, low risk scan   RIGHT HEART CATH N/A 06/27/2018   Procedure: RIGHT HEART CATH;  Surgeon: [name], MD;  Location: [facility];  Service: Cardiovascular;  Laterality: N/A;   TOTAL ABDOMINAL HYSTERECTOMY W/ BILATERAL SALPINGOOPHORECTOMY  1989   TRANSTHORACIC ECHOCARDIOGRAM  08/29/2012   Moderately calcified annulus of mitral valve, moderate regurg. of both mitral valve and tricuspid valve.     SOCIAL HISTORY: Social History   Socioeconomic History   Marital status: Widowed    Spouse name: Not on file   Number of children: Not on file   Years of education: Not on file   Highest education level: Not on file  Occupational History   Occupation: Retired  Tobacco Use   Smoking status: Former    Packs/day: 1.00    Years: 30.00    Total pack years: 30.00    Types: Cigarettes   Smokeless tobacco: Never   Tobacco comments:    quit smoking 2005  Vaping Use   Vaping Use: Never used  Substance and Sexual Activity   Alcohol use: No    Alcohol/week: 0.0 standard drinks of alcohol   Drug use: No   Sexual activity: Not Currently    Birth control/protection: None  Other Topics Concern   Not on file  Social History Narrative   Not on file   Social Determinants of Health   Financial Resource Strain: Low Risk  (02/28/2022)   Overall Financial Resource Strain (CARDIA)    Difficulty of Paying Living Expenses: Not hard at all  Food Insecurity: No Food Insecurity (07/09/2022)   Hunger Vital Sign    Worried About Running Out of Food in the Last Year: Never true    [place] in the Last Year: Never true  Transportation Needs: No Transportation Needs (07/09/2022)   PRAPARE - Hydrologist (Medical): No    Lack of  Transportation (Non-Medical): No  Physical Activity: Inactive (02/28/2022)   Exercise Vital Sign    Days of Exercise per Week: 0 days    Minutes of Exercise per Session: 0 min  Stress: No Stress Concern Present (02/28/2022)   [place]    Feeling of Stress : Only a little  Social Connections: Moderately Integrated (06/22/2022)   Social Connection and Isolation Panel [NHANES]    Frequency of Communication with Friends and Family: More than three times a week    Frequency of Social Gatherings with Friends and Family: More than three times a week    Attends Religious Services: More than 4 times per year    Active Member of [employer] or Organizations: Yes    Attends Archivist Meetings: More than 4 times per year    Marital Status: Widowed  Intimate Partner Violence: Not At Risk (07/09/2022)   Humiliation, Afraid, Rape, and Kick questionnaire    Fear of Current or Ex-Partner: No    Emotionally Abused: No    Physically Abused: No    Sexually Abused: No    FAMILY HISTORY: Family History  Problem Relation Age of Onset   Heart disease Father    Hypertension Father    Hyperlipidemia Father  Breast cancer Neg Hx     Review of Systems  Constitutional:  Positive for fatigue. Negative for appetite change, chills, fever and unexpected weight change.  HENT:   Negative for hearing loss, lump/mass, mouth sores, trouble swallowing and voice change.   Eyes:  Negative for eye problems and icterus.  Respiratory:  Negative for chest tightness, cough and shortness of breath.   Cardiovascular:  Positive for leg swelling. Negative for chest pain and palpitations.  Gastrointestinal:  Negative for abdominal distention, abdominal pain, blood in stool, constipation, diarrhea, nausea, rectal pain and vomiting.  Endocrine: Negative for hot flashes.  Genitourinary:  Negative for difficulty urinating.   Musculoskeletal:  Negative for  arthralgias.  Skin:  Negative for itching and rash.  Neurological:  Negative for dizziness, extremity weakness, headaches and numbness.  Hematological:  Negative for adenopathy. Does not bruise/bleed easily.  Psychiatric/Behavioral:  Negative for depression. The patient is not nervous/anxious.       PHYSICAL EXAMINATION  ECOG PERFORMANCE STATUS: 1 - Symptomatic but completely ambulatory  Vitals:   08/07/22 1247  BP: (!) 143/62  Pulse: 82  Resp: 16  Temp: 97.9 F (36.6 C)  SpO2: 100%    Physical Exam Constitutional:      General: She is not in acute distress.    Appearance: Normal appearance. She is not toxic-appearing.     Comments: She appears tired but no significant change compared to her last visit here  HENT:     Head: Normocephalic and atraumatic.  Eyes:     General: No scleral icterus. Cardiovascular:     Rate and Rhythm: Normal rate. Rhythm irregular.     Pulses: Normal pulses.     Heart sounds: Murmur (Systolic murmur with a click.) heard.  Pulmonary:     Effort: Pulmonary effort is normal.     Breath sounds: No rhonchi.  Abdominal:     General: Abdomen is flat. Bowel sounds are normal. There is no distension (Slight distention).     Palpations: Abdomen is soft.     Tenderness: There is no abdominal tenderness.  Musculoskeletal:        General: Swelling (Severe bilateral lower extremity swelling, legs wrapped in Ace wrap's) present.     Cervical back: Neck supple.     Right lower leg: Edema present.     Left lower leg: Edema present.  Lymphadenopathy:     Cervical: No cervical adenopathy.  Skin:    General: Skin is warm and dry.     Findings: Bruising (Small areas of ecchymosis on the left lower leg.) present. No rash.  Neurological:     General: No focal deficit present.     Mental Status: She is alert.  Psychiatric:        Mood and Affect: Mood normal.        Behavior: Behavior normal.     LABORATORY DATA:  CBC    Component Value Date/Time    WBC 2.3 (L) 08/07/2022 1221   RBC 2.87 (L) 08/07/2022 1222   RBC 2.87 (L) 08/07/2022 1221   HGB 9.0 (L) 08/07/2022 1221   HGB 7.8 (L) 05/25/2022 1131   HCT 27.5 (L) 08/07/2022 1221   HCT 28.5 (L) 11/20/2018 1824   PLT 146 (L) 08/07/2022 1221   PLT 105 (L) 05/25/2022 1131   MCV 95.8 08/07/2022 1221   MCH 31.4 08/07/2022 1221   MCHC 32.7 08/07/2022 1221   RDW 18.1 (H) 08/07/2022 1221   LYMPHSABS PENDING 08/07/2022 1221   MONOABS  PENDING 08/07/2022 1221   EOSABS PENDING 08/07/2022 1221   BASOSABS PENDING 08/07/2022 1221    CMP     Component Value Date/Time   NA 136 08/07/2022 1221   NA 137 12/06/2017 1436   K 3.9 08/07/2022 1221   CL 103 08/07/2022 1221   CO2 26 08/07/2022 1221   GLUCOSE 149 (H) 08/07/2022 1221   BUN 49 (H) 08/07/2022 1221   BUN 20 12/06/2017 1436   CREATININE 2.02 (H) 08/07/2022 1221   CREATININE 2.05 (H) 07/21/2022 1056   CALCIUM 8.2 (L) 08/07/2022 1221   PROT 5.2 (L) 08/07/2022 1221   ALBUMIN 3.1 (L) 08/07/2022 1221   AST 16 08/07/2022 1221   AST 23 07/21/2022 1056   ALT 11 08/07/2022 1221   ALT 17 07/21/2022 1056   ALKPHOS 86 08/07/2022 1221   BILITOT 0.5 08/07/2022 1221   BILITOT 0.4 07/21/2022 1056   GFRNONAA 24 (L) 08/07/2022 1221   GFRNONAA 24 (L) 07/21/2022 1056   GFRAA 32 (L) 03/30/2020 0932    ASSESSMENT and THERAPY PLAN:    ALLISA EINSPAHR is a 81 y.o. female who presents for multifactorial anemia, previously treated with rituximab for ANCA positive hemolytic anemia, previously on maintenance rituximab every 12 weeks presented with worsening anemia thought to be related to her hemolysis.  She was diagnosed with elevated ANCA titers 1 is to 640 and previously treated for ANCA positive hemolytic anemia, seen by rheumatology Dr. Salome Holmes back in 2020, responded at that time to prednisone and rituximab and continued on rituximab maintenance.  However her rituximab was changed to every 21-monthinfusions and she relapsed before her second  cycle of every 363-monthituximab.    # Multifactorial Anemia:    --Bone marrow biopsy from 02/20/2022 showed normocellular marrow with trilineage hematopoiesis. No leukemia, lymphoma or high-grade dysplasia identified. --CT CAP from 02/20/2022 did not show any underlying cause for anemia.  --ANCA titers are normal when checked on 02/10/2022 --Currently on prednisone 20 mg PO daily, mycophenolate was increased to 1000 mg however this was discontinued while she was hospitalized -- So far we have attempted prednisone taper 4 times and this was not successful.  Every time we dropped her dose below 20 mg, she has worsening hemolysis and anemia requiring blood transfusion.   -She is here for an interim visit because we had a phone call from her daughter saying the patient is very tired and they are really worried about her low white blood cell count.  Patient herself denies any change in her health.  She [facility]. Bilateral lower extremity swelling with no significant change. No fevers or chills or other concern for infection.  Repeat labs today shows improving white blood cell count consistent with a transient bone marrow suppression from recent hospitalizations and infection as well as antibiotic use.  I am a little concerned about the worsening reticulocyte count suggestive of possible worsening hemolysis.   #Erythema of bilateral lower extremities, likely venous stasis, no definitive evidence of cellulitis. I do not believe there is any significant change.  She should continue Lasix recommended by nephrology.  No concern for an active cellulitis today.   #Warfarin therapy for St. Jude's valve --Under the care of  [facility].  INR today is 3.2 slightly above therapeutic level, this is managed by cardiology  #CKD:  -- Followed by nephrology.   I have reviewed labs from last week. Total time  spent: 30  min  Plan Recommend repeating labs on Friday a.m.  We have also recommended patient get in contact with Dr. Redmond Pulling for further recommendations.  She will continue on EPO every 2 weeks, next dose of EPO due in about a week  *Total Encounter Time as defined by the Centers for Medicare and Medicaid Services includes, in addition to the face-to-face time of a patient visit (documented in the note above) non-face-to-face time: obtaining and reviewing outside history, ordering and reviewing medications, tests or procedures, care coordination (communications with other health care professionals or caregivers) and documentation in the medical record.

## 2022-08-07 NOTE — Telephone Encounter (Signed)
This RN retrieved message left at [place] from pt's daughter and current care giver- stating concern with pt post coming back from the ER " just not doing well, she is very tired and just laid around all weekend after coming back from the ER"  " Just very concern because she has no white count "  Per above this RN scheduled pt for lab and visit today for evaluation and further recommendations.  Pt and dtr aware of above and is agreement.

## 2022-08-08 ENCOUNTER — Inpatient Hospital Stay: Payer: Medicare HMO

## 2022-08-08 ENCOUNTER — Ambulatory Visit (INDEPENDENT_AMBULATORY_CARE_PROVIDER_SITE_OTHER): Payer: Medicare HMO | Admitting: Cardiology

## 2022-08-08 ENCOUNTER — Ambulatory Visit: Payer: Self-pay

## 2022-08-08 DIAGNOSIS — D594 Other nonautoimmune hemolytic anemias: Secondary | ICD-10-CM | POA: Diagnosis not present

## 2022-08-08 DIAGNOSIS — Z5181 Encounter for therapeutic drug level monitoring: Secondary | ICD-10-CM

## 2022-08-08 NOTE — Patient Outreach (Signed)
  Care Coordination   08/08/2022 Name: ATLEY SCARBORO MRN: [id number] DOB: Aug 16, 1941   Care Coordination Outreach Attempts:  An unsuccessful telephone outreach was attempted for a scheduled appointment today.  Follow Up Plan:  Additional outreach attempts will be made to offer the patient care coordination information and services.   Encounter Outcome:  No Answer  Care Coordination Interventions Activated:  No   Care Coordination Interventions:  No, not indicated    Barb Merino, RN, BSN, CCM Care Management Coordinator [facility] Care Management Direct Phone: [phone]

## 2022-08-08 NOTE — Patient Instructions (Signed)
Description   Called and spoke to pt's daughter and son who where with pt, instructed pt to: -Hold warfarin today  -then START taking warfarin 1 tablet daily except 1.5 tablets on Sunday  -Recheck INR on 11/3 (Pt on 7 day course of cefdnir and having loose stools, pt going to the cancer center on 11/3) Call with Prednisone doseage changes ('15mg'$  daily - 10/25).  Please keep green leafy veggies  Call with any new meds or changes [phone]

## 2022-08-09 LAB — CULTURE, BLOOD (ROUTINE X 2)
Culture: NO GROWTH
Special Requests: ADEQUATE

## 2022-08-10 ENCOUNTER — Other Ambulatory Visit: Payer: Self-pay | Admitting: Physician Assistant

## 2022-08-11 ENCOUNTER — Ambulatory Visit (INDEPENDENT_AMBULATORY_CARE_PROVIDER_SITE_OTHER): Payer: Medicare HMO

## 2022-08-11 ENCOUNTER — Inpatient Hospital Stay (HOSPITAL_BASED_OUTPATIENT_CLINIC_OR_DEPARTMENT_OTHER): Payer: Medicare HMO | Admitting: Physician Assistant

## 2022-08-11 ENCOUNTER — Other Ambulatory Visit: Payer: Self-pay

## 2022-08-11 ENCOUNTER — Inpatient Hospital Stay: Payer: Medicare HMO | Attending: Oncology

## 2022-08-11 VITALS — BP 141/57 | HR 60 | Temp 97.6°F | Resp 15 | Wt 140.9 lb

## 2022-08-11 DIAGNOSIS — D594 Other nonautoimmune hemolytic anemias: Secondary | ICD-10-CM

## 2022-08-11 DIAGNOSIS — I7782 Antineutrophilic cytoplasmic antibody (ANCA) vasculitis: Secondary | ICD-10-CM | POA: Insufficient documentation

## 2022-08-11 DIAGNOSIS — K5900 Constipation, unspecified: Secondary | ICD-10-CM | POA: Diagnosis not present

## 2022-08-11 DIAGNOSIS — Z7901 Long term (current) use of anticoagulants: Secondary | ICD-10-CM | POA: Insufficient documentation

## 2022-08-11 DIAGNOSIS — D631 Anemia in chronic kidney disease: Secondary | ICD-10-CM | POA: Insufficient documentation

## 2022-08-11 DIAGNOSIS — D591 Autoimmune hemolytic anemia, unspecified: Secondary | ICD-10-CM

## 2022-08-11 DIAGNOSIS — N184 Chronic kidney disease, stage 4 (severe): Secondary | ICD-10-CM | POA: Insufficient documentation

## 2022-08-11 DIAGNOSIS — Z952 Presence of prosthetic heart valve: Secondary | ICD-10-CM | POA: Diagnosis not present

## 2022-08-11 LAB — CMP (CANCER CENTER ONLY)
ALT: 10 U/L (ref 0–44)
AST: 16 U/L (ref 15–41)
Albumin: 3.2 g/dL — ABNORMAL LOW (ref 3.5–5.0)
Alkaline Phosphatase: 87 U/L (ref 38–126)
Anion gap: 8 (ref 5–15)
BUN: 47 mg/dL — ABNORMAL HIGH (ref 8–23)
CO2: 26 mmol/L (ref 22–32)
Calcium: 8.5 mg/dL — ABNORMAL LOW (ref 8.9–10.3)
Chloride: 101 mmol/L (ref 98–111)
Creatinine: 1.93 mg/dL — ABNORMAL HIGH (ref 0.44–1.00)
GFR, Estimated: 25 mL/min — ABNORMAL LOW (ref >60)
Glucose, Bld: 219 mg/dL — ABNORMAL HIGH (ref 70–99)
Potassium: 4.4 mmol/L (ref 3.5–5.1)
Sodium: 135 mmol/L (ref 135–145)
Total Bilirubin: 0.6 mg/dL (ref 0.3–1.2)
Total Protein: 5.1 g/dL — ABNORMAL LOW (ref 6.5–8.1)

## 2022-08-11 LAB — CBC WITH DIFFERENTIAL (CANCER CENTER ONLY)
Abs Immature Granulocytes: 0.11 10*3/uL — ABNORMAL HIGH (ref 0.00–0.07)
Basophils Absolute: 0.1 10*3/uL (ref 0.0–0.1)
Basophils Relative: 1 %
Eosinophils Absolute: 0.1 10*3/uL (ref 0.0–0.5)
Eosinophils Relative: 1 %
HCT: 30.2 % — ABNORMAL LOW (ref 36.0–46.0)
Hemoglobin: 9.9 g/dL — ABNORMAL LOW (ref 12.0–15.0)
Immature Granulocytes: 1 %
Lymphocytes Relative: 12 %
Lymphs Abs: 1.1 10*3/uL (ref 0.7–4.0)
MCH: 31.6 pg (ref 26.0–34.0)
MCHC: 32.8 g/dL (ref 30.0–36.0)
MCV: 96.5 fL (ref 80.0–100.0)
Monocytes Absolute: 0.4 10*3/uL (ref 0.1–1.0)
Monocytes Relative: 5 %
Neutro Abs: 7.7 10*3/uL (ref 1.7–7.7)
Neutrophils Relative %: 80 %
Platelet Count: 153 10*3/uL (ref 150–400)
RBC: 3.13 MIL/uL — ABNORMAL LOW (ref 3.87–5.11)
RDW: 17.7 % — ABNORMAL HIGH (ref 11.5–15.5)
WBC Count: 9.5 10*3/uL (ref 4.0–10.5)
nRBC: 0 % (ref 0.0–0.2)

## 2022-08-11 LAB — RETIC PANEL
Immature Retic Fract: 17.2 % — ABNORMAL HIGH (ref 2.3–15.9)
RBC.: 3.14 MIL/uL — ABNORMAL LOW (ref 3.87–5.11)
Retic Count, Absolute: 158.9 10*3/uL (ref 19.0–186.0)
Retic Ct Pct: 5.1 % — ABNORMAL HIGH (ref 0.4–3.1)
Reticulocyte Hemoglobin: 31.6 pg (ref >27.9)

## 2022-08-11 LAB — PROTIME-INR
INR: 2.1 — ABNORMAL HIGH (ref 0.8–1.2)
Prothrombin Time: 23 seconds — ABNORMAL HIGH (ref 11.4–15.2)

## 2022-08-11 NOTE — Progress Notes (Signed)
[facility] Follow up:    [name], [name], MD [place] [address]   DIAGNOSIS: Hemolytic Anemia  SUMMARY OF HEMATOLOGIC HISTORY: [name] is a 81 y.o. [place] woman with multifactorial anemia, as follows:             (a) anemia of chronic inflammation: As of May 2020 she has a SED rate of 95, CRP 1.1, positive ANA and RF; subsequently she was found to be ANCA positive             (b) hemolysis (mild): she has a mildly elevated LDH and a DAT positive for complement, not IgG; this is c/w (a) above             (c) anemia of renal insufficiency: inadequate EPO resonse to anemia                         (d) GIB/ iron deficiency in patient on lifelong anticoagulaton   (1) Feraheme received 03/01/2019, repeated 03/20/2019              (a) reticulocyte 03/07/2019 up from 43.4 a year ago to 191.8 after iron infusion             (b) feraheme repeated on 05/12/2019             (c) subsequent ferritin levels >100   (2) darbepoetin: starting 05/05/2019 at 154mg given every 2 weeks             (a) dose increased to 200 mcg every week starting 07/01/2019              (b) back to every 2 weeks beginning 08/04/2019 still at 200 mcg dose             (c) switched to Retacrit every 4 weeks as of 08/18/2019   (3) ANCA positive vasculitis: diagnosed 05/08/2019 (titer 1:640)             (a) prednisone 40 mg daily started 05/31/2019             (b) rituximab weekly started 06/12/2019 (received test dose 06/05/2019)                         (i) hepatitis B sAg, core Ab and HIV negative 05/08/2019                         (ii) rituximab held after 07/14/2019 dose (received 5 weekly doses) with neutropenia                         (iii) rituximab resumed 08/18/2019 but changed to monthly                         (iv) rituximab changed to every 2 months beginning with 02/02/2020 dose                         (v) rituximab changed to every 12 weeks after the  10/12/2020 dose             (c) prednisone tapered to off as of 09/15/2019                          (4) Anemia worsened in spring 2023,  refractory to Rituximab and requiring prednisone restart with slow taper due currently at 58m which she will stay at.   (5) osteopenia with a T score of -1.5 on bone density 02/28/2016 at the [facility] center  CURRENT THERAPY: Prednisone 10 mg, Epo injection q 14 dats  INTERVAL HISTORY: [name] SHEPHERD89y.o. female returns for follow-up of her hemolytic anemia. She is accompanied by her son. Ms. BStampreports that her energy levels are slightly improved last 3 to 4 days.  Her appetite is unchanged and she denies any significant weight changes.  She denies nausea, abdominal pain.  Stretching constipation.  She reports that edema in her lower extremities are stable to somewhat improved.  She is noticing improvement edema in her right lower leg.  She is currently on prednisone 10 mg with plans to taper down to 5 mg this Saturday.  She denies fevers, chills, night sweats, shortness of breath, chest pain or cough.  She has no other complaints.  Rest of the pertinent 10 point ROS reviewed and negative  Patient Active Problem List   Diagnosis Date Noted   Palliative care encounter    Goals of care, counseling/discussion    Counseling and coordination of care    GI bleed 07/09/2022   GI bleeding 07/08/2022   Acute on chronic anemia 07/08/2022   History of Clostridioides difficile colitis 07/08/2022   DNR (do not resuscitate)/DNI(Do Not Intubate) 07/08/2022   Anemia of chronic disease 07/07/2022   Pressure injury of skin 06/23/2022   UTI (urinary tract infection) 06/22/2022   Joint pain 06/09/2022   Parietoalveolar pneumopathy ([facility] 06/09/2022   Primary osteoarthritis 06/09/2022   Rheumatoid factor positive 06/09/2022   C. difficile colitis 04/19/2022   Colitis 04/18/2022   PAH (pulmonary artery hypertension) (HHavel[facility] 04/18/2022   Emphysematous cystitis  04/13/2022   Female bladder prolapse 04/13/2022   Diverticulitis 04/12/2022   Autoimmune hemolytic anemia (HCon[facility] 01/02/2022   Bilateral lower extremity edema 01/02/2022   Refractory anemia (HCC) 12/15/2021   Abnormal chest x-ray 12/15/2021   Supratherapeutic INR 12/15/2021   Elevated troponin 12/15/2021   Unilateral primary osteoarthritis, right hip 05/31/2021   Midline cystocele 05/30/2021   Primary localized osteoarthritis of pelvic region and thigh 05/30/2021   Vaginal pain 05/30/2021   Asymmetric SNHL (sensorineural hearing loss) 01/06/2021   Referred otalgia of both ears 01/06/2021   Bilateral hearing loss 08/31/2020   Low back pain 02/20/2020   CKD (chronic kidney disease), stage IV (HNisqually [facility] [place] 10/13/2019   Leukopenia due to antineoplastic chemotherapy (HLawt[facility] 08/18/2019   Thrombophilia (HOval 06/19/2019   Encounter for general adult medical examination without abnormal findings 06/13/2019   Arteritis (H[place] 06/06/2019   Hypertensive heart and chronic kidney disease with heart failure and stage 1 through stage 4 chronic kidney disease, or unspecified chronic kidney disease (HScott 06/06/2019   Muscle weakness 06/06/2019   Pancytopenia (HSpring Valley 05/28/2019   Dyspnea 05/28/2019   Vasculitis, ANCA positive 05/28/2019   Disorder of connective tissue (HBonnie 05/14/2019   Renal insufficiency 05/08/2019   PAF (paroxysmal atrial fibrillation) (HCedar Grove 04/07/2019   Iron deficiency anemia 02/28/2019   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 (HChester[place] 02/28/2019   Diabetic retinopathy associated with type 2 diabetes mellitus (HRappahannock 09/02/2018   Chronic pain 06/19/2018   Non-thrombocytopenic purpura (HSeven [place] 05/21/2018   Gout 03/22/2018   Malnutrition of moderate degree 03/12/2018   Diabetes mellitus type 2, controlled (HScreven 03/06/2018   H/O mechanical aortic valve replacement 03/06/2018   Moderate to severe pulmonary hypertension (HLake Helen  03/06/2018   GERD (gastroesophageal reflux disease)  02/21/2018   Anxiety 02/21/2018   Chronic diastolic CHF (congestive heart failure) ([place]) 02/21/2018   General weakness 02/02/2018   Thrombocytopenia ([place]) 01/25/2018   Arm pain, diffuse 01/25/2018   Anemia 01/23/2018   Aortic atherosclerosis ([place]) 01/23/2018   Age-related osteoporosis without current pathological fracture 05/24/2017   Carotid artery occlusion 05/24/2017   Generalized anxiety disorder 05/24/2017   Hearing loss 05/24/2017   Presence of prosthetic heart valve 05/24/2017   Carotid artery disease ([place]) 08/18/2013   Peripheral arterial disease ([place]) 08/18/2013   Diabetes mellitus without complication ([place]) 35/45/6256   Hypertension    Hypercholesteremia    Mechanical heart valve present    Chronic anticoagulation     is allergic to flagyl [metronidazole], augmentin [amoxicillin-pot clavulanate], ciprofloxacin, coreg [carvedilol], diflucan [fluconazole], losartan, privigen [immune globulin (human)], sulfamethoxazole-trimethoprim, verapamil, zetia [ezetimibe], zocor [simvastatin - high dose], and gatifloxacin.  MEDICAL HISTORY: Past Medical History:  Diagnosis Date   Aortic atherosclerosis ([place]) 01/23/2018   Atrial fibrillation (HCC)    Benign positional vertigo 10/06/2011   Carotid artery disease (HCC)    Chemotherapy induced neutropenia ([place]) 07/21/2019   Cholelithiasis 01/23/2018   Cholelithiasis 01/23/2018   Chronic anticoagulation    Colovesical fistula, ruled out 04/14/2022   Coronary artery disease    status post coronary artery bypass grafting times 07/10/2004   Diabetes mellitus    Hypercholesteremia    Hypertension    Mechanical heart valve present    H. aortic valve replacement at the time of bypass surgery October 2005   Moderate to severe pulmonary hypertension ([place])    Peripheral arterial disease ([place])    history of left common iliac artery PTA and stenting for a chronic total occlusion 08/26/01   S/P cholecystectomy 03/06/2018    SURGICAL  HISTORY: Past Surgical History:  Procedure Laterality Date   anterior repair  2009   AORTIC VALVE REPLACEMENT  2005   Dr. Cyndia Bent   CARDIAC CATHETERIZATION  11/10/2004   40% right common illiac, 70% in stent restenosis of distal left common illiac,    CARDIAC CATHETERIZATION  05/18/2004   LAD 50-70% midstenosis, RCA dominant w/50% stenosis, 50% Right common Illiac artery ostial stenosis, 90% in stent restenosis within midportion of left common illiac stent   Carotid Duplex  03/12/2012   RSA-elev. velocities suggestive of a 50-69% diameter reduction, Right&Left Bulb/Prox ICA-mild-mod.fibrous plaqueelevating Velocities abnormal study.   CHOLECYSTECTOMY N/A 03/01/2018   Procedure: LAPAROSCOPIC CHOLECYSTECTOMY;  Surgeon: Judeth Horn, MD;  Location: [facility];  Service: General;  Laterality: N/A;   COLONOSCOPY WITH PROPOFOL N/A 01/22/2018   Procedure: COLONOSCOPY WITH PROPOFOL;  Surgeon: Wilford Corner, MD;  Location: [facility];  Service: Endoscopy;  Laterality: N/A;   ESOPHAGOGASTRODUODENOSCOPY N/A 07/13/2022   Procedure: ESOPHAGOGASTRODUODENOSCOPY (EGD);  Surgeon: [name], MD;  Location[facility];  Service: Gastroenterology;  Laterality: N/A;   ESOPHAGOGASTRODUODENOSCOPY (EGD) WITH PROPOFOL N/A 01/22/2018   Procedure: ESOPHAGOGASTRODUODENOSCOPY (EGD) WITH PROPOFOL;  Surgeon: Wilford Corner, MD;  Location: [facility];  Service: Endoscopy;  Laterality: N/A;   ESOPHAGOGASTRODUODENOSCOPY (EGD) WITH PROPOFOL N/A 11/21/2018   Procedure: ESOPHAGOGASTRODUODENOSCOPY (EGD) WITH PROPOFOL;  Surgeon: [name], MD;  Location: [facility];  Service: Gastroenterology;  Laterality: N/A;   ESOPHAGOGASTRODUODENOSCOPY (EGD) WITH PROPOFOL Left 02/27/2019   Procedure: ESOPHAGOGASTRODUODENOSCOPY (EGD) WITH PROPOFOL;  Surgeon: Arta Silence, MD;  Location[facility];  Service: Endoscopy;  Laterality: Left;   GIVENS CAPSULE STUDY N/A 11/21/2018   Procedure: GIVENS CAPSULE STUDY;  Surgeon: [name],  MD;  Location: [facility];  Service: Gastroenterology;  Laterality: N/A;  To be deployed during EGD   Lower Ext. Duplex  03/12/2012   Right Proximal CIA- vessel narrowing w/elevated velocities 0-49% diameter reduction. Right SFA-mild mixed density plaque throughout vessel.   NM MYOCAR PERF WALL MOTION  05/19/2010   protocol: Persantine, post stress EF 65%, negative for ischemia, low risk scan   RIGHT HEART CATH N/A 06/27/2018   Procedure: RIGHT HEART CATH;  Surgeon: [name], MD;  Location: [facility];  Service: Cardiovascular;  Laterality: N/A;   TOTAL ABDOMINAL HYSTERECTOMY W/ BILATERAL SALPINGOOPHORECTOMY  1989   TRANSTHORACIC ECHOCARDIOGRAM  08/29/2012   Moderately calcified annulus of mitral valve, moderate regurg. of both mitral valve and tricuspid valve.     SOCIAL HISTORY: Social History   Socioeconomic History   Marital status: Widowed    Spouse name: Not on file   Number of children: Not on file   Years of education: Not on file   Highest education level: Not on file  Occupational History   Occupation: Retired  Tobacco Use   Smoking status: Former    Packs/day: 1.00    Years: 30.00    Total pack years: 30.00    Types: Cigarettes   Smokeless tobacco: Never   Tobacco comments:    quit smoking 2005  Vaping Use   Vaping Use: Never used  Substance and Sexual Activity   Alcohol use: No    Alcohol/week: 0.0 standard drinks of alcohol   Drug use: No   Sexual activity: Not Currently    Birth control/protection: None  Other Topics Concern   Not on file  Social History Narrative   Not on file   Social Determinants of Health   Financial Resource Strain: Low Risk  (02/28/2022)   Overall Financial Resource Strain (CARDIA)    Difficulty of Paying Living Expenses: Not hard at all  Food Insecurity: No Food Insecurity (07/09/2022)   Hunger Vital Sign    Worried About Running Out of Food in the Last Year: Never true    [place] in the Last Year: Never  true  Transportation Needs: No Transportation Needs (07/09/2022)   PRAPARE - Hydrologist (Medical): No    Lack of Transportation (Non-Medical): No  Physical Activity: Inactive (02/28/2022)   Exercise Vital Sign    Days of Exercise per Week: 0 days    Minutes of Exercise per Session: 0 min  Stress: No Stress Concern Present (02/28/2022)   [place]    Feeling of Stress : Only a little  Social Connections: Moderately Integrated (06/22/2022)   Social Connection and Isolation Panel [NHANES]    Frequency of Communication with Friends and Family: More than three times a week    Frequency of Social Gatherings with Friends and Family: More than three times a week    Attends Religious Services: More than 4 times per year    Active Member of [employer] or Organizations: Yes    Attends Archivist Meetings: More than 4 times per year    Marital Status: Widowed  Intimate Partner Violence: Not At Risk (07/09/2022)   Humiliation, Afraid, Rape, and Kick questionnaire    Fear of Current or Ex-Partner: No    Emotionally Abused: No    Physically Abused: No    Sexually Abused: No    FAMILY HISTORY: Family History  Problem Relation Age of Onset  Heart disease Father    Hypertension Father    Hyperlipidemia Father    Breast cancer Neg Hx     Review of Systems  Constitutional:  Positive for fatigue. Negative for appetite change, chills, fever and unexpected weight change.  HENT:   Negative for hearing loss, lump/mass, mouth sores, trouble swallowing and voice change.   Eyes:  Negative for eye problems and icterus.  Respiratory:  Negative for chest tightness, cough and shortness of breath.   Cardiovascular:  Positive for leg swelling. Negative for chest pain and palpitations.  Gastrointestinal:  Negative for abdominal distention, abdominal pain, blood in stool, constipation, diarrhea, nausea, rectal  pain and vomiting.  Endocrine: Negative for hot flashes.  Genitourinary:  Negative for difficulty urinating.   Musculoskeletal:  Negative for arthralgias.  Skin:  Negative for itching and rash.  Neurological:  Negative for dizziness, extremity weakness, headaches and numbness.  Hematological:  Negative for adenopathy. Does not bruise/bleed easily.  Psychiatric/Behavioral:  Negative for depression. The patient is not nervous/anxious.       PHYSICAL EXAMINATION  ECOG PERFORMANCE STATUS: 1 - Symptomatic but completely ambulatory  Vitals:   08/11/22 1026  BP: (!) 141/57  Pulse: 60  Resp: 15  Temp: 97.6 F (36.4 C)  SpO2: 96%    Physical Exam Constitutional:      General: She is not in acute distress.    Appearance: Normal appearance. She is not toxic-appearing.  HENT:     Head: Normocephalic and atraumatic.  Eyes:     General: No scleral icterus. Cardiovascular:     Rate and Rhythm: Normal rate.     Pulses: Normal pulses.     Heart sounds: Murmur (Systolic murmur with a click.) heard.  Pulmonary:     Effort: Pulmonary effort is normal.     Breath sounds: No rhonchi.  Musculoskeletal:        General: Swelling (Severe bilateral lower extremity swelling, legs wrapped in Ace wrap's) present.     Cervical back: Neck supple.     Right lower leg: Edema (slightly improved) present.     Left lower leg: Edema present.  Lymphadenopathy:     Cervical: No cervical adenopathy.  Skin:    General: Skin is warm and dry.     Findings: Bruising (Small areas of ecchymosis on the left lower leg.) present. No rash.  Neurological:     General: No focal deficit present.     Mental Status: She is alert.  Psychiatric:        Mood and Affect: Mood normal.        Behavior: Behavior normal.     LABORATORY DATA:  CBC    Component Value Date/Time   WBC 9.5 08/11/2022 1004   WBC 2.3 (L) 08/07/2022 1221   RBC 3.14 (L) 08/11/2022 1004   RBC 3.13 (L) 08/11/2022 1004   HGB 9.9 (L)  08/11/2022 1004   HCT 30.2 (L) 08/11/2022 1004   HCT 28.5 (L) 11/20/2018 1824   PLT 153 08/11/2022 1004   MCV 96.5 08/11/2022 1004   MCH 31.6 08/11/2022 1004   MCHC 32.8 08/11/2022 1004   RDW 17.7 (H) 08/11/2022 1004   LYMPHSABS 1.1 08/11/2022 1004   MONOABS 0.4 08/11/2022 1004   EOSABS 0.1 08/11/2022 1004   BASOSABS 0.1 08/11/2022 1004    CMP     Component Value Date/Time   NA 135 08/11/2022 1004   NA 137 12/06/2017 1436   K 4.4 08/11/2022 1004   CL 101 08/11/2022  1004   CO2 26 08/11/2022 1004   GLUCOSE 219 (H) 08/11/2022 1004   BUN 47 (H) 08/11/2022 1004   BUN 20 12/06/2017 1436   CREATININE 1.93 (H) 08/11/2022 1004   CALCIUM 8.5 (L) 08/11/2022 1004   PROT 5.1 (L) 08/11/2022 1004   ALBUMIN 3.2 (L) 08/11/2022 1004   AST 16 08/11/2022 1004   ALT 10 08/11/2022 1004   ALKPHOS 87 08/11/2022 1004   BILITOT 0.6 08/11/2022 1004   GFRNONAA 25 (L) 08/11/2022 1004   GFRAA 32 (L) 03/30/2020 0932    ASSESSMENT and THERAPY PLAN:    [name] is a 81 y.o. female who presents for multifactorial anemia, previously treated with rituximab for ANCA positive hemolytic anemia, previously on maintenance rituximab every 12 weeks presented with worsening anemia thought to be related to her hemolysis.  She was diagnosed with elevated ANCA titers 1 is to 640 and previously treated for ANCA positive hemolytic anemia, seen by rheumatology Dr. Salome Holmes back in 2020, responded at that time to prednisone and rituximab and continued on rituximab maintenance.  However her rituximab was changed to every 68-monthinfusions and she relapsed before her second cycle of every 369-monthituximab.    # Multifactorial Anemia:  --Bone marrow biopsy from 02/20/2022 showed normocellular marrow with trilineage hematopoiesis. No leukemia, lymphoma or high-grade dysplasia identified. --CT CAP from 02/20/2022 did not show any underlying cause for anemia.  --ANCA titers are normal when checked on  02/10/2022 --Mycophenolate was increased to 1000 mg however this was discontinued while she was hospitalized --[facility]. Plan to taper to 5 mg starting 08/12/2022.  --Labs today reviewed that shows improvement of cytopenias, WBC 9.5, Hgb 9.9, Plt 153. Creatinine has improved to 1.93. Normal LFTs.  --No need for blood transfusion today  #Erythema of bilateral lower extremities, likely venous stasis, no definitive evidence of cellulitis. Mild improvement, mainly in right lower extremity.  She should continue Lasix recommended by nephrology.  No concern for an active cellulitis today.   #Warfarin therapy for St. Jude's valve --Under the care of  [facility].  INR today is 2.1 in therapeutic range, this is managed by cardiology  #CKD:  -- Followed by nephrology.   Follow up: --Patient will return next week for labs, f/u visit with Dr. IrChryl Heckefore next Epo injection.    Patient and her son expressed understanding and satisfaction with the plan provided.   I have spent a total of 25 minutes minutes of face-to-face and non-face-to-face time, preparing to see the patient, performing a medically appropriate examination, counseling and educating the patient, documenting clinical information in the electronic health record,and care coordination.   IrDede QueryA-C Dept of Hematology and OnSister Bayt WeAvera Creighton Hospitalhone: [phone]

## 2022-08-11 NOTE — Patient Instructions (Signed)
Description   Called and spoke to pt's son who was with pt, instructed pt to continue taking warfarin 1 tablet daily except 1.5 tablets on Sunday  Recheck INR in 1 week.  Call with Prednisone doseage changes ('10mg'$  daily - 11/3, decrease to '5mg'$  on 11/5).  Please keep green leafy veggies  Call with any new meds or changes [phone]

## 2022-08-15 DIAGNOSIS — H02885 Meibomian gland dysfunction left lower eyelid: Secondary | ICD-10-CM | POA: Diagnosis not present

## 2022-08-15 DIAGNOSIS — H16223 Keratoconjunctivitis sicca, not specified as Sjogren's, bilateral: Secondary | ICD-10-CM | POA: Diagnosis not present

## 2022-08-15 DIAGNOSIS — H02882 Meibomian gland dysfunction right lower eyelid: Secondary | ICD-10-CM | POA: Diagnosis not present

## 2022-08-16 ENCOUNTER — Encounter: Payer: Self-pay | Admitting: Hematology and Oncology

## 2022-08-16 ENCOUNTER — Other Ambulatory Visit (HOSPITAL_COMMUNITY): Payer: Self-pay | Admitting: Adult Health

## 2022-08-16 ENCOUNTER — Other Ambulatory Visit (HOSPITAL_COMMUNITY): Payer: Self-pay | Admitting: Cardiology

## 2022-08-16 NOTE — Telephone Encounter (Signed)
No entry 

## 2022-08-17 ENCOUNTER — Inpatient Hospital Stay: Payer: Medicare HMO

## 2022-08-17 ENCOUNTER — Encounter (HOSPITAL_COMMUNITY): Payer: Self-pay | Admitting: Emergency Medicine

## 2022-08-17 ENCOUNTER — Other Ambulatory Visit: Payer: Self-pay

## 2022-08-17 ENCOUNTER — Emergency Department (HOSPITAL_COMMUNITY): Payer: Medicare HMO

## 2022-08-17 ENCOUNTER — Inpatient Hospital Stay: Payer: Medicare HMO | Admitting: Hematology and Oncology

## 2022-08-17 ENCOUNTER — Emergency Department (HOSPITAL_COMMUNITY)
Admission: EM | Admit: 2022-08-17 | Discharge: 2022-08-17 | Disposition: A | Discharge status: Home / Self Care | Payer: Medicare HMO | Attending: Emergency Medicine | Admitting: Emergency Medicine

## 2022-08-17 DIAGNOSIS — R791 Abnormal coagulation profile: Secondary | ICD-10-CM | POA: Insufficient documentation

## 2022-08-17 DIAGNOSIS — I509 Heart failure, unspecified: Secondary | ICD-10-CM | POA: Insufficient documentation

## 2022-08-17 DIAGNOSIS — R109 Unspecified abdominal pain: Secondary | ICD-10-CM | POA: Diagnosis present

## 2022-08-17 DIAGNOSIS — I959 Hypotension, unspecified: Secondary | ICD-10-CM | POA: Diagnosis not present

## 2022-08-17 DIAGNOSIS — E119 Type 2 diabetes mellitus without complications: Secondary | ICD-10-CM | POA: Diagnosis not present

## 2022-08-17 DIAGNOSIS — N390 Urinary tract infection, site not specified: Secondary | ICD-10-CM | POA: Insufficient documentation

## 2022-08-17 DIAGNOSIS — R0689 Other abnormalities of breathing: Secondary | ICD-10-CM | POA: Diagnosis not present

## 2022-08-17 DIAGNOSIS — Z794 Long term (current) use of insulin: Secondary | ICD-10-CM | POA: Diagnosis not present

## 2022-08-17 DIAGNOSIS — R1084 Generalized abdominal pain: Secondary | ICD-10-CM | POA: Diagnosis not present

## 2022-08-17 DIAGNOSIS — I700 Atherosclerosis of aorta: Secondary | ICD-10-CM | POA: Diagnosis not present

## 2022-08-17 DIAGNOSIS — Z7902 Long term (current) use of antithrombotics/antiplatelets: Secondary | ICD-10-CM | POA: Insufficient documentation

## 2022-08-17 DIAGNOSIS — I4891 Unspecified atrial fibrillation: Secondary | ICD-10-CM | POA: Diagnosis not present

## 2022-08-17 DIAGNOSIS — Z79899 Other long term (current) drug therapy: Secondary | ICD-10-CM | POA: Diagnosis not present

## 2022-08-17 DIAGNOSIS — R1030 Lower abdominal pain, unspecified: Secondary | ICD-10-CM | POA: Diagnosis not present

## 2022-08-17 LAB — URINALYSIS, ROUTINE W REFLEX MICROSCOPIC
Bilirubin Urine: NEGATIVE
Glucose, UA: NEGATIVE mg/dL
Hgb urine dipstick: NEGATIVE
Ketones, ur: NEGATIVE mg/dL
Nitrite: POSITIVE — AB
Protein, ur: 100 mg/dL — AB
Specific Gravity, Urine: 1.009 (ref 1.005–1.030)
WBC, UA: >50 WBC/hpf — ABNORMAL HIGH (ref 0–5)
pH: 6 (ref 5.0–8.0)

## 2022-08-17 LAB — BASIC METABOLIC PANEL
Anion gap: 11 (ref 5–15)
BUN: 62 mg/dL — ABNORMAL HIGH (ref 8–23)
CO2: 23 mmol/L (ref 22–32)
Calcium: 8.1 mg/dL — ABNORMAL LOW (ref 8.9–10.3)
Chloride: 101 mmol/L (ref 98–111)
Creatinine, Ser: 2.56 mg/dL — ABNORMAL HIGH (ref 0.44–1.00)
GFR, Estimated: 18 mL/min — ABNORMAL LOW (ref >60)
Glucose, Bld: 160 mg/dL — ABNORMAL HIGH (ref 70–99)
Potassium: 4.4 mmol/L (ref 3.5–5.1)
Sodium: 135 mmol/L (ref 135–145)

## 2022-08-17 LAB — CBC WITH DIFFERENTIAL/PLATELET
Abs Immature Granulocytes: 0.07 10*3/uL (ref 0.00–0.07)
Basophils Absolute: 0 10*3/uL (ref 0.0–0.1)
Basophils Relative: 0 %
Eosinophils Absolute: 0.1 10*3/uL (ref 0.0–0.5)
Eosinophils Relative: 1 %
HCT: 31.1 % — ABNORMAL LOW (ref 36.0–46.0)
Hemoglobin: 10 g/dL — ABNORMAL LOW (ref 12.0–15.0)
Immature Granulocytes: 1 %
Lymphocytes Relative: 4 %
Lymphs Abs: 0.6 10*3/uL — ABNORMAL LOW (ref 0.7–4.0)
MCH: 31.3 pg (ref 26.0–34.0)
MCHC: 32.2 g/dL (ref 30.0–36.0)
MCV: 97.2 fL (ref 80.0–100.0)
Monocytes Absolute: 0.9 10*3/uL (ref 0.1–1.0)
Monocytes Relative: 7 %
Neutro Abs: 12.2 10*3/uL — ABNORMAL HIGH (ref 1.7–7.7)
Neutrophils Relative %: 87 %
Platelets: 154 10*3/uL (ref 150–400)
RBC: 3.2 MIL/uL — ABNORMAL LOW (ref 3.87–5.11)
RDW: 17 % — ABNORMAL HIGH (ref 11.5–15.5)
WBC: 13.8 10*3/uL — ABNORMAL HIGH (ref 4.0–10.5)
nRBC: 0 % (ref 0.0–0.2)

## 2022-08-17 LAB — HEPATIC FUNCTION PANEL
ALT: 20 U/L (ref 0–44)
AST: 38 U/L (ref 15–41)
Albumin: 3.1 g/dL — ABNORMAL LOW (ref 3.5–5.0)
Alkaline Phosphatase: 102 U/L (ref 38–126)
Bilirubin, Direct: 0.2 mg/dL (ref 0.0–0.2)
Indirect Bilirubin: 0.6 mg/dL (ref 0.3–0.9)
Total Bilirubin: 0.8 mg/dL (ref 0.3–1.2)
Total Protein: 5.5 g/dL — ABNORMAL LOW (ref 6.5–8.1)

## 2022-08-17 LAB — LIPASE, BLOOD: Lipase: 30 U/L (ref 11–51)

## 2022-08-17 LAB — PROTIME-INR
INR: 2.5 — ABNORMAL HIGH (ref 0.8–1.2)
Prothrombin Time: 27.2 seconds — ABNORMAL HIGH (ref 11.4–15.2)

## 2022-08-17 MED ORDER — FOSFOMYCIN TROMETHAMINE 3 G PO PACK
3.0000 g | PACK | Freq: Once | ORAL | Status: AC
  Administered 2022-08-17: 3 g via ORAL
  Filled 2022-08-17: qty 3

## 2022-08-17 MED ORDER — CIPROFLOXACIN HCL 500 MG PO TABS: 500.0000 mg | ORAL_TABLET | Freq: Two times a day (BID) | ORAL | 0 refills | Status: AC

## 2022-08-17 MED ORDER — SODIUM CHLORIDE 0.9 % IV BOLUS
1000.0000 mL | Freq: Once | INTRAVENOUS | Status: AC
  Administered 2022-08-17: 1000 mL via INTRAVENOUS

## 2022-08-17 NOTE — ED Triage Notes (Signed)
Per EMS-sudden onset lower abd pain today. Abd normally distended at baseline. Pt reports currently on steroids for vasculitis. Lives with family. Recently treated for diverticulitis. Received '4mg'$  morphine IV in rounte per EMS.

## 2022-08-17 NOTE — ED Notes (Signed)
Patient transported to CT 

## 2022-08-17 NOTE — ED Provider Notes (Signed)
[facility] EMERGENCY DEPARTMENT Provider Note   CSN: [id number] Arrival date & time: 08/17/22  1025     History  Chief Complaint  Patient presents with   Abdominal Pain    [name] is a 81 y.o. female w/ hx of UTI, anemia, CHF, diabetes, AVR on coumadin, presenting to ED with abdominal pain, abrupt onset in lower abdomen yesterday and overnight into this morning.  Worse with sitting.  Feels like "pressure" on her bladder.  Denies n/v/diarrhea.  Reports she had pain radiating down both of her thighs this morning, to mid-thigh, but now improved.  No hx of AAA or smoking per her report Daughter and family at bedside  Recently finished antibiotics for UTI  CT 07/01/22 showing subcutaneous fat stranding in anterior abdominal wall and bilateral thighs, and marked diverticulitis/duodenitis   06/25/2022 FINAL   Organism ID, Bacteria ESCHERICHIA COLI Abnormal   Organism ID, Bacteria KLEBSIELLA PNEUMONIAE Abnormal   Resulting Agency [facility]     Susceptibility   Escherichia coli Klebsiella pneumoniae    MIC MIC    AMPICILLIN <=2 SENSITIVE Sensitive >=32 RESIST... Resistant    AMPICILLIN/SULBACTAM <=2 SENSITIVE Sensitive 16 INTERMED... Intermediate    CEFAZOLIN <=4 SENSITIVE Sensitive >=64 RESIST... Resistant    CEFEPIME <=0.12 SENS... Sensitive >=32 RESIST... Resistant    CEFTRIAXONE <=0.25 SENS... Sensitive 32 RESISTANT Resistant    CIPROFLOXACIN <=0.25 SENS... Sensitive <=0.25 SENS... Sensitive    GENTAMICIN <=1 SENSITIVE Sensitive >=16 RESIST... Resistant    IMIPENEM <=0.25 SENS... Sensitive <=0.25 SENS... Sensitive    NITROFURANTOIN <=16 SENSIT... Sensitive <=16 SENSIT... Sensitive    PIP/TAZO <=4 SENSITIVE Sensitive <=4 SENSITIVE Sensitive    TRIMETH/SULFA <=20 SENSIT... Sensitive >=320 RESIS... Resistant           HPI     Home Medications Prior to Admission medications   Medication Sig Start Date End Date Taking? Authorizing Provider  alendronate  (FOSAMAX) 70 MG tablet Take 70 mg by mouth once a week. Saturdays 12/06/21  Yes [provider]  allopurinol (ZYLOPRIM) 100 MG tablet Take 100 mg by mouth daily.   Yes [provider]  ALPRAZolam (XANAX) 0.5 MG tablet Take 1 tablet (0.5 mg total) by mouth at bedtime. 07/14/22  Yes Sheikh, Omair Latif, DO  amLODipine (NORVASC) 10 MG tablet TAKE 1 TABLET EVERY DAY Patient taking differently: Take 10 mg by mouth daily. 05/17/22  Yes [name], MD  atorvastatin (LIPITOR) 80 MG tablet Take 1 tablet (80 mg total) by mouth daily at 6 PM. Patient taking differently: Take 80 mg by mouth daily. 11/22/18  Yes [name], MD  Cholecalciferol (VITAMIN D3) 50 MCG (2000 UT) capsule Take 2,000 Units by mouth daily with lunch. 11/26/18  Yes [provider]  ciprofloxacin (CIPRO) 500 MG tablet Take 1 tablet (500 mg total) by mouth every 12 (twelve) hours for 7 days. 08/19/22 08/26/22 Yes Shafter Jupin, Carola Rhine, MD  cloNIDine (CATAPRES) 0.2 MG tablet TAKE 1 TABLET TWICE DAILY 08/16/22  Yes [name], MD  Cyanocobalamin (VITAMIN B12) 1000 MCG TBCR Take 1,000 mcg by mouth daily with lunch.   Yes [provider]  famotidine (PEPCID) 20 MG tablet Take 1 tablet (20 mg total) by mouth 2 (two) times daily. 04/10/22  Yes Benay Pike, MD  fluconazole (DIFLUCAN) 100 MG tablet Take 1 tablet (100 mg total) by mouth daily. 07/15/22  Yes Sheikh, Omair Latif, DO  folic acid (FOLVITE) 1 MG tablet Take 1 mg by mouth daily.  Yes [provider]  furosemide (LASIX) 40 MG tablet TAKE 2 TABLETS IN MORNING AND 1 AND 1/2 TABLETS IN EVENING. CHANGE IN DOSAGE. 08/16/22  Yes Clegg, Amy D, NP  hydrALAZINE (APRESOLINE) 100 MG tablet TAKE 1 TABLET THREE TIMES DAILY Patient taking differently: Take 100 mg by mouth 3 (three) times daily. 02/17/22  Yes [name], MD  insulin aspart (NOVOLOG) 100 UNIT/ML injection Take 2 units for blood sugar 200-250, 4 units for blood sugar 252-300, 6 units  for blood sugar 301-350, 8 units for blood sugar greater than 350 and call MD. Patient taking differently: Inject 2-8 Units into the skin 3 (three) times daily with meals. Per sliding scale, If blood sugar is 200 to 250, give 2 units. If blood sugar is 251 to 300, give 4 units. If blood sugar is 301 to 350, give 6 units. If blood sugar is greater than 350, give 8 units and call MD. 02/23/22  Yes Causey, Charlestine Massed, NP  insulin glargine-yfgn (SEMGLEE) 100 UNIT/ML injection Inject 0.1 mLs (10 Units total) into the skin daily. Patient taking differently: Inject 0.1 mLs into the skin daily. 07/15/22  Yes Sheikh, [place], DO  metoprolol succinate (TOPROL-XL) 25 MG 24 hr tablet TAKE 1/2 TABLET EVERY DAY 08/16/22  Yes [name], MD  ondansetron (ZOFRAN) 4 MG tablet Take 1 tablet (4 mg total) by mouth every 6 (six) hours as needed for nausea. 07/14/22  Yes Sheikh, Omair Latif, DO  pantoprazole (PROTONIX) 40 MG tablet Take 40 mg by mouth 2 (two) times daily. 06/25/22  Yes [provider]  predniSONE (DELTASONE) 5 MG tablet Take 5 mg by mouth daily. Take to more doses 07/17/22  Yes [provider]  saccharomyces boulardii (FLORASTOR) 250 MG capsule Take 250 mg by mouth 2 (two) times daily.   Yes [provider]  simethicone (GAS-X) 80 MG chewable tablet Chew 1 tablet (80 mg total) by mouth 4 (four) times daily as needed for flatulence. 04/16/22 04/16/23 Yes Danford, Suann Larry, MD  spironolactone (ALDACTONE) 25 MG tablet TAKE 1 TABLET EVERY DAY Patient taking differently: Take 25 mg by mouth daily. 05/17/22  Yes [name], MD  tadalafil, PAH, (ADCIRCA) 20 MG tablet Take 2 tablets (40 mg total) by mouth daily. Patient taking differently: Take 40 mg by mouth at bedtime. 12/06/21  Yes [name], MD  vitamin C (ASCORBIC ACID) 500 MG tablet Take 500 mg by mouth daily with lunch.   Yes [provider]  warfarin (COUMADIN) 5 MG tablet TAKE 1 TO 1 AND 1/2  TABLETS DAILY AS DIRECTED BY COUMADIN CLINIC Patient taking differently: Take 1 tablet ever day except for on Sundays 1 and 1/2 tablets 07/17/22  Yes Lorretta Harp, MD  acetaminophen (TYLENOL) 325 MG tablet Take 325 mg by mouth every 6 (six) hours as needed for mild pain or fever.    [provider]  cefdinir (OMNICEF) 300 MG capsule Take 300 mg by mouth 2 (two) times daily. Patient not taking: Reported on 08/17/2022 08/07/22   [provider]  cholestyramine Lucrezia Starch) 4 GM/DOSE powder Take 4 g by mouth daily after supper. Patient not taking: Reported on 08/17/2022 06/07/22   [provider]  estradiol (ESTRACE) 0.1 MG/GM vaginal cream Place 0.5g nightly for two weeks then twice a week after Patient not taking: Reported on 08/17/2022 09/19/21   Jaquita Folds, MD  furosemide (LASIX) 20 MG tablet Take 60-100 mg by mouth See admin instructions. Two  entries on MAR for furosemide '20mg'$ . Take 3 tablets ('60mg'$ ) by mouth every evening. Take 5 tablets ('100mg'$ ) once a day. Patient not taking: Reported on 08/17/2022 07/14/22   [provider]  HYDROmorphone HCl (DILAUDID) 1 MG/ML LIQD Take 1-2 mLs (1-2 mg total) by mouth every 6 (six) hours as needed for severe pain or moderate pain (use oral medication prior to giving IV). Patient not taking: Reported on 08/17/2022 07/14/22   Raiford Noble Latif, DO  Insulin Syringe-Needle U-100 (B-D INS SYR ULTRAFINE .3CC/29G) 29G X 1/2" 0.3 ML MISC Use for sliding scale for breakthrough high blood sugar readings 02/24/22   Benay Pike, MD  lidocaine (XYLOCAINE) 2 % solution Use as directed 15 mLs in the mouth or throat as needed for mouth pain. Patient not taking: Reported on 08/17/2022 01/30/22   Benay Pike, MD  Menthol, Topical Analgesic, (BIOFREEZE) 4 % GEL Apply 1 Application topically 3 (three) times daily. 1 application topically to lower back and BLE Patient not taking: Reported on 08/17/2022    [provider]   mirabegron ER (MYRBETRIQ) 25 MG TB24 tablet Take 1 tablet (25 mg total) by mouth daily. Patient not taking: Reported on 08/17/2022 04/17/22   Daleen Bo, MD  nystatin-alum & mag hydroxide-simeth-diphenhydrAMINE Swish 5 mLs by mouth 3 times daily then spit Patient not taking: Reported on 08/17/2022 05/15/22   Barrie Folk, PA-C      Allergies    Flagyl [metronidazole], Augmentin [amoxicillin-pot clavulanate], Ciprofloxacin, Coreg [carvedilol], Diflucan [fluconazole], Losartan, Privigen [immune globulin (human)], Sulfamethoxazole-trimethoprim, Verapamil, Zetia [ezetimibe], Zocor [simvastatin - high dose], and Gatifloxacin    Review of Systems   Review of Systems  Physical Exam Updated Vital Signs BP (!) 155/46   Pulse 73   Temp 99.2 F (37.3 C) (Oral)   Resp (!) 21   Ht 5' (1.524 m)   Wt 63.5 kg   SpO2 98%   BMI 27.34 kg/m  Physical Exam Constitutional:      General: She is not in acute distress. HENT:     Head: Normocephalic and atraumatic.  Eyes:     Conjunctiva/sclera: Conjunctivae normal.     Pupils: Pupils are equal, round, and reactive to light.  Cardiovascular:     Rate and Rhythm: Normal rate and regular rhythm.  Pulmonary:     Effort: Pulmonary effort is normal. No respiratory distress.  Abdominal:     General: There is no distension.     Tenderness: There is abdominal tenderness in the suprapubic area.  Skin:    General: Skin is warm and dry.  Neurological:     General: No focal deficit present.     Mental Status: She is alert. Mental status is at baseline.  Psychiatric:        Mood and Affect: Mood normal.        Behavior: Behavior normal.     ED Results / Procedures / Treatments   Labs (all labs ordered are listed, but only abnormal results are displayed) Labs Reviewed  BASIC METABOLIC PANEL - Abnormal; Notable for the following components:      Result Value   Glucose, Bld 160 (*)    BUN 62 (*)    Creatinine, Ser 2.56 (*)    Calcium 8.1  (*)    GFR, Estimated 18 (*)    All other components within normal limits  CBC WITH DIFFERENTIAL/PLATELET - Abnormal; Notable for the following components:   WBC 13.8 (*)    RBC 3.20 (*)  Hemoglobin 10.0 (*)    HCT 31.1 (*)    RDW 17.0 (*)    Neutro Abs 12.2 (*)    Lymphs Abs 0.6 (*)    All other components within normal limits  URINALYSIS, ROUTINE W REFLEX MICROSCOPIC - Abnormal; Notable for the following components:   APPearance CLOUDY (*)    Protein, ur 100 (*)    Nitrite POSITIVE (*)    Leukocytes,Ua LARGE (*)    WBC, UA >50 (*)    Bacteria, UA RARE (*)    All other components within normal limits  HEPATIC FUNCTION PANEL - Abnormal; Notable for the following components:   Total Protein 5.5 (*)    Albumin 3.1 (*)    All other components within normal limits  PROTIME-INR - Abnormal; Notable for the following components:   Prothrombin Time 27.2 (*)    INR 2.5 (*)    All other components within normal limits  URINE CULTURE  LIPASE, BLOOD    EKG None  Radiology CT ABDOMEN PELVIS WO CONTRAST  Result Date: 08/17/2022 CLINICAL DATA:  Lower abdominal pain EXAM: CT ABDOMEN AND PELVIS WITHOUT CONTRAST TECHNIQUE: Multidetector CT imaging of the abdomen and pelvis was performed following the standard protocol without IV contrast. RADIATION DOSE REDUCTION: This exam was performed according to the departmental dose-optimization program which includes automated exposure control, adjustment of the mA and/or kV according to patient size and/or use of iterative reconstruction technique. COMPARISON:  07/08/2022 FINDINGS: Lower chest: Dependent bibasilar airspace opacities, likely atelectasis. Trace bilateral effusions. Aortic atherosclerosis. Hepatobiliary: No focal liver abnormality is seen. Status post cholecystectomy. No biliary dilatation. Pancreas: No focal abnormality or ductal dilatation. Spleen: Scattered punctate calcifications compatible with old granulomas. Normal size.  Adrenals/Urinary Tract: No adrenal abnormality. No focal renal abnormality. No stones or hydronephrosis. Urinary bladder is unremarkable. Stomach/Bowel: Extensive sigmoid diverticulosis. No active diverticulitis. Large stool burden in the colon. Stomach and small bowel decompressed, unremarkable. Previously seen inflammatory changes around the duodenum have resolved. Vascular/Lymphatic: Diffuse aortic and arterial atherosclerosis. No evidence of aneurysm or adenopathy. Reproductive: Prior hysterectomy.  No adnexal masses. Other: Trace free fluid in the pelvis.  No free air. Musculoskeletal: Stable mild compression fracture involving the L4 vertebral body. No acute bony abnormality. IMPRESSION: Improved inflammation around the duodenum from prior study. Extensive sigmoid diverticulosis.  No active diverticulitis. Large stool burden in the colon. Trace free fluid. Diffuse aortic/arterial atherosclerosis. Bibasilar atelectasis with trace bilateral effusions. Electronically Signed   By: Rolm Baptise M.D.   On: 08/17/2022 14:08    Procedures Procedures    Medications Ordered in ED Medications  sodium chloride 0.9 % bolus 1,000 mL (0 mLs Intravenous Stopped 08/17/22 1357)  fosfomycin (MONUROL) packet 3 g (3 g Oral Given 08/17/22 1359)    ED Course/ Medical Decision Making/ A&P Clinical Course as of 08/17/22 1708  Thu Aug 17, 2022  1234 Nitrite(!): POSITIVE [MT]  1234 Leukocytes,Ua(!): LARGE [MT]  6 Daughter.  I think she has a persistent UTI and that she has not been adequately treated with cephalosporins, which she has had resistance to in the past.  We will give her fosfomycin as a single dose today.  It is not clear for prior infections have resistance to this.  However she continues to have abdominal discomfort and 2 days today we will start ciprofloxacin.  She has this listed as an allergy but relates an intolerance for GI upset, she says she has tolerated in the past and will do so again.  Watch  and wait prescription was provided.  I also encouraged her to drink more water, noting that her BUN and creatinine are elevated, she does admit that she does not drink a lot of water at home.  Fluids were given in the ED.  She is comfortable for discharge with her daughter. [MT]    Clinical Course User Index [MT] Araminta Zorn, Carola Rhine, MD                           Medical Decision Making Amount and/or Complexity of Data Reviewed Labs: ordered. Decision-making details documented in ED Course. Radiology: ordered.  Risk Prescription drug management.   This patient presents to the Emergency Department with complaint of abdominal pain. This involves an extensive number of treatment options, and is a complaint that carries with it a high risk of complications and morbidity.  The differential diagnosis includes, but is not limited to, gastritis vs biliary disease vs peptic ulcer vs constipation vs colitis vs UTI vs other  Presentation sounds similar to Sept 2023 CT eval for anterior wall inflammation of the abdomen, possibly 2/2 subcutaneous abdominal injections, and duodenitis.  Anticipate likely repeat CT iamging  I ordered, reviewed, and interpreted labs, notable for UA with +nitrites, leuks, bacteria, WBC, consistent with UTI.  INR therapeutic at 2.5.  WBC 13.8.  Bmp with chronic CKD, mild worsening of BUN and Cr consistent with dehydration, K 4.4. I ordered medication IV fluids for hydration and fosfomycin for UTI management I ordered imaging studies which included CT abdomen pelvis  I independently visualized and interpreted imaging which showed resolution of duodenitis, sigmoid diverticulosis; stool in colon; no AAA; and the monitor tracing which showed regular HR Additional history was obtained from patient's daughter at bedside Previous records obtained and reviewed showing most recent UTI cx results as noted above   After the interventions stated above, I reevaluated the patient and found  that they remained clinically stable.  Minimal abdominal pain and no leg pain.  She reports she has regular daily BM and is not constipated.  Based on the patient's clinical exam, vital signs, risk factors, and ED testing, I felt that the patient's overall risk of life-threatening emergency such as bowel perforation, surgical emergency, AAA, or sepsis was quite low.  I suspect this clinical presentation is most consistent with symptomatic UTI, but explained to the patient that this evaluation was not a definitive diagnostic workup.  See ed course         Final Clinical Impression(s) / ED Diagnoses Final diagnoses:  Urinary tract infection without hematuria, site unspecified  Abdominal pain, unspecified abdominal location    Rx / DC Orders ED Discharge Orders          Ordered    ciprofloxacin (CIPRO) 500 MG tablet  Every 12 hours        08/17/22 1439              Wyvonnia Dusky, MD 08/17/22 1708

## 2022-08-17 NOTE — Discharge Instructions (Addendum)
If Merisa is still having pressure in her bladder or abdomen in 2 days, you can start the ciprofloxacin antibiotic. We gave her a single dose of fosfomycin antibiotic in the ED today, which I am hoping will clear up her infection.  Sometimes however, bacteria can be resistant to certain antibiotics.  Please encourage Myndi to drink more water.  Her kidney function was a bit worse today with creatinine level of 2.5, likely from dehydration.

## 2022-08-18 ENCOUNTER — Inpatient Hospital Stay: Payer: Medicare HMO

## 2022-08-18 ENCOUNTER — Inpatient Hospital Stay (HOSPITAL_BASED_OUTPATIENT_CLINIC_OR_DEPARTMENT_OTHER): Payer: Medicare HMO | Admitting: Hematology and Oncology

## 2022-08-18 ENCOUNTER — Ambulatory Visit (INDEPENDENT_AMBULATORY_CARE_PROVIDER_SITE_OTHER): Payer: Medicare HMO | Admitting: Cardiovascular Disease

## 2022-08-18 ENCOUNTER — Encounter: Payer: Self-pay | Admitting: Hematology and Oncology

## 2022-08-18 VITALS — BP 124/69 | HR 75 | Temp 97.7°F | Resp 16 | Ht 60.0 in | Wt 138.2 lb

## 2022-08-18 DIAGNOSIS — Z5181 Encounter for therapeutic drug level monitoring: Secondary | ICD-10-CM | POA: Diagnosis not present

## 2022-08-18 DIAGNOSIS — I7782 Antineutrophilic cytoplasmic antibody (ANCA) vasculitis: Secondary | ICD-10-CM | POA: Diagnosis not present

## 2022-08-18 DIAGNOSIS — D631 Anemia in chronic kidney disease: Secondary | ICD-10-CM | POA: Diagnosis not present

## 2022-08-18 DIAGNOSIS — R600 Localized edema: Secondary | ICD-10-CM

## 2022-08-18 DIAGNOSIS — N184 Chronic kidney disease, stage 4 (severe): Secondary | ICD-10-CM | POA: Diagnosis not present

## 2022-08-18 DIAGNOSIS — K5900 Constipation, unspecified: Secondary | ICD-10-CM | POA: Diagnosis not present

## 2022-08-18 DIAGNOSIS — D638 Anemia in other chronic diseases classified elsewhere: Secondary | ICD-10-CM

## 2022-08-18 DIAGNOSIS — D591 Autoimmune hemolytic anemia, unspecified: Secondary | ICD-10-CM | POA: Diagnosis not present

## 2022-08-18 DIAGNOSIS — Z7901 Long term (current) use of anticoagulants: Secondary | ICD-10-CM | POA: Diagnosis not present

## 2022-08-18 DIAGNOSIS — D500 Iron deficiency anemia secondary to blood loss (chronic): Secondary | ICD-10-CM

## 2022-08-18 DIAGNOSIS — D594 Other nonautoimmune hemolytic anemias: Secondary | ICD-10-CM

## 2022-08-18 LAB — CBC WITH DIFFERENTIAL (CANCER CENTER ONLY)
Abs Immature Granulocytes: 0.05 10*3/uL (ref 0.00–0.07)
Basophils Absolute: 0.1 10*3/uL (ref 0.0–0.1)
Basophils Relative: 1 %
Eosinophils Absolute: 0.2 10*3/uL (ref 0.0–0.5)
Eosinophils Relative: 2 %
HCT: 28.7 % — ABNORMAL LOW (ref 36.0–46.0)
Hemoglobin: 9.2 g/dL — ABNORMAL LOW (ref 12.0–15.0)
Immature Granulocytes: 1 %
Lymphocytes Relative: 9 %
Lymphs Abs: 0.8 10*3/uL (ref 0.7–4.0)
MCH: 30.8 pg (ref 26.0–34.0)
MCHC: 32.1 g/dL (ref 30.0–36.0)
MCV: 96 fL (ref 80.0–100.0)
Monocytes Absolute: 0.6 10*3/uL (ref 0.1–1.0)
Monocytes Relative: 6 %
Neutro Abs: 7.7 10*3/uL (ref 1.7–7.7)
Neutrophils Relative %: 81 %
Platelet Count: 154 10*3/uL (ref 150–400)
RBC: 2.99 MIL/uL — ABNORMAL LOW (ref 3.87–5.11)
RDW: 16.7 % — ABNORMAL HIGH (ref 11.5–15.5)
WBC Count: 9.3 10*3/uL (ref 4.0–10.5)
nRBC: 0 % (ref 0.0–0.2)

## 2022-08-18 LAB — PROTIME-INR
INR: 2.5 — ABNORMAL HIGH (ref 0.8–1.2)
Prothrombin Time: 26.9 seconds — ABNORMAL HIGH (ref 11.4–15.2)

## 2022-08-18 LAB — CMP (CANCER CENTER ONLY)
ALT: 17 U/L (ref 0–44)
AST: 28 U/L (ref 15–41)
Albumin: 3 g/dL — ABNORMAL LOW (ref 3.5–5.0)
Alkaline Phosphatase: 96 U/L (ref 38–126)
Anion gap: 7 (ref 5–15)
BUN: 57 mg/dL — ABNORMAL HIGH (ref 8–23)
CO2: 23 mmol/L (ref 22–32)
Calcium: 7.9 mg/dL — ABNORMAL LOW (ref 8.9–10.3)
Chloride: 103 mmol/L (ref 98–111)
Creatinine: 2.44 mg/dL — ABNORMAL HIGH (ref 0.44–1.00)
GFR, Estimated: 19 mL/min — ABNORMAL LOW (ref >60)
Glucose, Bld: 275 mg/dL — ABNORMAL HIGH (ref 70–99)
Potassium: 4.3 mmol/L (ref 3.5–5.1)
Sodium: 133 mmol/L — ABNORMAL LOW (ref 135–145)
Total Bilirubin: 0.6 mg/dL (ref 0.3–1.2)
Total Protein: 5.3 g/dL — ABNORMAL LOW (ref 6.5–8.1)

## 2022-08-18 LAB — RETIC PANEL
Immature Retic Fract: 7.9 % (ref 2.3–15.9)
RBC.: 2.99 MIL/uL — ABNORMAL LOW (ref 3.87–5.11)
Retic Count, Absolute: 59.5 10*3/uL (ref 19.0–186.0)
Retic Ct Pct: 2 % (ref 0.4–3.1)
Reticulocyte Hemoglobin: 30.1 pg (ref >27.9)

## 2022-08-18 LAB — SAMPLE TO BLOOD BANK

## 2022-08-18 MED ORDER — EPOETIN ALFA-EPBX 40000 UNIT/ML IJ SOLN
40000.0000 [IU] | Freq: Once | INTRAMUSCULAR | Status: AC
  Administered 2022-08-18: 40000 [IU] via SUBCUTANEOUS
  Filled 2022-08-18: qty 1

## 2022-08-18 NOTE — Patient Instructions (Signed)

## 2022-08-18 NOTE — Progress Notes (Signed)
[facility] Follow up:    Benay Pike, MD [address] [place] [address]  DIAGNOSIS: Hemolytic Anemia  SUMMARY OF HEMATOLOGIC HISTORY: JIANNI SHELDEN is a 81 y.o. No Name woman with multifactorial anemia, as follows:             (a) anemia of chronic inflammation: As of May 2020 she has a SED rate of 95, CRP 1.1, positive ANA and RF; subsequently she was found to be ANCA positive             (b) hemolysis (mild): she has a mildly elevated LDH and a DAT positive for complement, not IgG; this is c/w (a) above             (c) anemia of renal insufficiency: inadequate EPO resonse to anemia                         (d) GIB/ iron deficiency in patient on lifelong anticoagulaton   (1) Feraheme received 03/01/2019, repeated 03/20/2019              (a) reticulocyte 03/07/2019 up from 43.4 a year ago to 191.8 after iron infusion             (b) feraheme repeated on 05/12/2019             (c) subsequent ferritin levels >100   (2) darbepoetin: starting 05/05/2019 at 111mg given every 2 weeks             (a) dose increased to 200 mcg every week starting 07/01/2019              (b) back to every 2 weeks beginning 08/04/2019 still at 200 mcg dose             (c) switched to Retacrit every 4 weeks as of 08/18/2019   (3) ANCA positive vasculitis: diagnosed 05/08/2019 (titer 1:640)             (a) prednisone 40 mg daily started 05/31/2019             (b) rituximab weekly started 06/12/2019 (received test dose 06/05/2019)                         (i) hepatitis B sAg, core Ab and HIV negative 05/08/2019                         (ii) rituximab held after 07/14/2019 dose (received 5 weekly doses) with neutropenia                         (iii) rituximab resumed 08/18/2019 but changed to monthly                         (iv) rituximab changed to every 2 months beginning with 02/02/2020 dose                         (v) rituximab changed to every 12 weeks after the 10/12/2020  dose             (c) prednisone tapered to off as of 09/15/2019                          (4) Anemia worsened in spring 2023, refractory  to Rituximab and requiring prednisone restart with slow taper due currently at 14m which she will stay at.   (5) osteopenia with a T score of -1.5 on bone density 02/28/2016 at the breast center  CURRENT THERAPY: Prednisone 10 mg, Epo injection q 14 dats  INTERVAL HISTORY: DHAVANAH NELMS89y.o. female returns for follow-up of her hemolytic anemia.  Since last visit, she tells me she has had a rough time.  [facility].  She was also found to have a possible persistent UTI, got a dose of fosfomycin.  The abdominal imaging was concerning for duodenitis versus changes from subcutaneous injections.  She today denies any pain.  She tells me that the morphine that the gave at the ER took care of the pain.  She is otherwise taking all her medications, last day of prednisone will be tomorrow. Rest of the pertinent 10 point ROS reviewed and negative  Patient Active Problem List   Diagnosis Date Noted   Palliative care encounter    Goals of care, counseling/discussion    Counseling and coordination of care    GI bleed 07/09/2022   GI bleeding 07/08/2022   Acute on chronic anemia 07/08/2022   History of Clostridioides difficile colitis 07/08/2022   DNR (do not resuscitate)/DNI(Do Not Intubate) 07/08/2022   Anemia of chronic disease 07/07/2022   Pressure injury of skin 06/23/2022   UTI (urinary tract infection) 06/22/2022   Joint pain 06/09/2022   Parietoalveolar pneumopathy ([place] 06/09/2022   Primary osteoarthritis 06/09/2022   Rheumatoid factor positive 06/09/2022   C. difficile colitis 04/19/2022   Colitis 04/18/2022   PAH (pulmonary artery hypertension) ([place] 04/18/2022   Emphysematous cystitis 04/13/2022   Female bladder prolapse 04/13/2022   Diverticulitis 04/12/2022    Autoimmune hemolytic anemia (HMaunawili 01/02/2022   Bilateral lower extremity edema 01/02/2022   Refractory anemia (HCC) 12/15/2021   Abnormal chest x-ray 12/15/2021   Supratherapeutic INR 12/15/2021   Elevated troponin 12/15/2021   Unilateral primary osteoarthritis, right hip 05/31/2021   Midline cystocele 05/30/2021   Primary localized osteoarthritis of pelvic region and thigh 05/30/2021   Vaginal pain 05/30/2021   Asymmetric SNHL (sensorineural hearing loss) 01/06/2021   Referred otalgia of both ears 01/06/2021   Bilateral hearing loss 08/31/2020   Low back pain 02/20/2020   CKD (chronic kidney disease), stage IV ([place] 10/13/2019   Leukopenia due to antineoplastic chemotherapy ([facility] 08/18/2019   Thrombophilia ([facility] 06/19/2019   Encounter for general adult medical examination without abnormal findings 06/13/2019   Arteritis ([facility] 06/06/2019   Hypertensive heart and chronic kidney disease with heart failure and stage 1 through stage 4 chronic kidney disease, or unspecified chronic kidney disease ([place] 06/06/2019   Muscle weakness 06/06/2019   Pancytopenia ([place] 05/28/2019   Dyspnea 05/28/2019   Vasculitis, ANCA positive 05/28/2019   Disorder of connective tissue ([facility] 05/14/2019   Renal insufficiency 05/08/2019   PAF (paroxysmal atrial fibrillation) ([place] 04/07/2019   Iron deficiency anemia 02/28/2019   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 ([facility] 02/28/2019   Diabetic retinopathy associated with type 2 diabetes mellitus ([facility] 09/02/2018   Chronic pain 06/19/2018   Non-thrombocytopenic purpura ([place] 05/21/2018   Gout 03/22/2018   Malnutrition of moderate degree 03/12/2018   Diabetes mellitus type 2, controlled ([facility] 03/06/2018   H/O mechanical aortic valve replacement 03/06/2018   Moderate to severe pulmonary hypertension (HCol[place] 03/06/2018   GERD (gastroesophageal reflux disease) 02/21/2018  Anxiety 02/21/2018   Chronic diastolic CHF (congestive heart  failure) ([place]) 02/21/2018   General weakness 02/02/2018   Thrombocytopenia ([place]) 01/25/2018   Arm pain, diffuse 01/25/2018   Anemia 01/23/2018   Aortic atherosclerosis ([place]) 01/23/2018   Age-related osteoporosis without current pathological fracture 05/24/2017   Carotid artery occlusion 05/24/2017   Generalized anxiety disorder 05/24/2017   Hearing loss 05/24/2017   Presence of prosthetic heart valve 05/24/2017   Carotid artery disease ([place]) 08/18/2013   Peripheral arterial disease ([place]) 08/18/2013   Diabetes mellitus without complication ([place]) 46/80/3212   Hypertension    Hypercholesteremia    Mechanical heart valve present    Chronic anticoagulation     is allergic to flagyl [metronidazole], augmentin [amoxicillin-pot clavulanate], ciprofloxacin, coreg [carvedilol], diflucan [fluconazole], losartan, privigen [immune globulin (human)], sulfamethoxazole-trimethoprim, verapamil, zetia [ezetimibe], zocor [simvastatin - high dose], and gatifloxacin.  MEDICAL HISTORY: Past Medical History:  Diagnosis Date   Aortic atherosclerosis ([place]) 01/23/2018   Atrial fibrillation (HCC)    Benign positional vertigo 10/06/2011   Carotid artery disease (HCC)    Chemotherapy induced neutropenia ([place]) 07/21/2019   Cholelithiasis 01/23/2018   Cholelithiasis 01/23/2018   Chronic anticoagulation    Colovesical fistula, ruled out 04/14/2022   Coronary artery disease    status post coronary artery bypass grafting times 07/10/2004   Diabetes mellitus    Hypercholesteremia    Hypertension    Mechanical heart valve present    H. aortic valve replacement at the time of bypass surgery October 2005   Moderate to severe pulmonary hypertension ([place])    Peripheral arterial disease ([place])    history of left common iliac artery PTA and stenting for a chronic total occlusion 08/26/01   S/P cholecystectomy 03/06/2018    SURGICAL HISTORY: Past Surgical History:  Procedure Laterality Date   anterior repair   2009   AORTIC VALVE REPLACEMENT  2005   Dr. Cyndia Bent   CARDIAC CATHETERIZATION  11/10/2004   40% right common illiac, 70% in stent restenosis of distal left common illiac,    CARDIAC CATHETERIZATION  05/18/2004   LAD 50-70% midstenosis, RCA dominant w/50% stenosis, 50% Right common Illiac artery ostial stenosis, 90% in stent restenosis within midportion of left common illiac stent   Carotid Duplex  03/12/2012   RSA-elev. velocities suggestive of a 50-69% diameter reduction, Right&Left Bulb/Prox ICA-mild-mod.fibrous plaqueelevating Velocities abnormal study.   CHOLECYSTECTOMY N/A 03/01/2018   Procedure: LAPAROSCOPIC CHOLECYSTECTOMY;  Surgeon: Judeth Horn, MD;  Location: [facility];  Service: General;  Laterality: N/A;   COLONOSCOPY WITH PROPOFOL N/A 01/22/2018   Procedure: COLONOSCOPY WITH PROPOFOL;  Surgeon: Wilford Corner, MD;  Location: [facility];  Service: Endoscopy;  Laterality: N/A;   ESOPHAGOGASTRODUODENOSCOPY N/A 07/13/2022   Procedure: ESOPHAGOGASTRODUODENOSCOPY (EGD);  Surgeon: [name], MD;  Location[facility];  Service: Gastroenterology;  Laterality: N/A;   ESOPHAGOGASTRODUODENOSCOPY (EGD) WITH PROPOFOL N/A 01/22/2018   Procedure: ESOPHAGOGASTRODUODENOSCOPY (EGD) WITH PROPOFOL;  Surgeon: Wilford Corner, MD;  Location: [facility];  Service: Endoscopy;  Laterality: N/A;   ESOPHAGOGASTRODUODENOSCOPY (EGD) WITH PROPOFOL N/A 11/21/2018   Procedure: ESOPHAGOGASTRODUODENOSCOPY (EGD) WITH PROPOFOL;  Surgeon: [name], MD;  Location: [facility];  Service: Gastroenterology;  Laterality: N/A;   ESOPHAGOGASTRODUODENOSCOPY (EGD) WITH PROPOFOL Left 02/27/2019   Procedure: ESOPHAGOGASTRODUODENOSCOPY (EGD) WITH PROPOFOL;  Surgeon: Arta Silence, MD;  [facility];  Service: Endoscopy;  Laterality: Left;   GIVENS CAPSULE STUDY N/A 11/21/2018   Procedure: GIVENS CAPSULE STUDY;  Surgeon: [name], MD;  Location: [facility];  Service: Gastroenterology;  Laterality: N/A;  To be  deployed during EGD   Lower Ext. Duplex  03/12/2012   Right Proximal CIA- vessel narrowing w/elevated velocities 0-49% diameter reduction. Right SFA-mild mixed density plaque throughout vessel.   NM MYOCAR PERF WALL MOTION  05/19/2010   protocol: Persantine, post stress EF 65%, negative for ischemia, low risk scan   RIGHT HEART CATH N/A 06/27/2018   Procedure: RIGHT HEART CATH;  Surgeon: [name], MD;  Location: [facility];  Service: Cardiovascular;  Laterality: N/A;   TOTAL ABDOMINAL HYSTERECTOMY W/ BILATERAL SALPINGOOPHORECTOMY  1989   TRANSTHORACIC ECHOCARDIOGRAM  08/29/2012   Moderately calcified annulus of mitral valve, moderate regurg. of both mitral valve and tricuspid valve.     SOCIAL HISTORY: Social History   Socioeconomic History   Marital status: Widowed    Spouse name: Not on file   Number of children: Not on file   Years of education: Not on file   Highest education level: Not on file  Occupational History   Occupation: Retired  Tobacco Use   Smoking status: Former    Packs/day: 1.00    Years: 30.00    Total pack years: 30.00    Types: Cigarettes   Smokeless tobacco: Never   Tobacco comments:    quit smoking 2005  Vaping Use   Vaping Use: Never used  Substance and Sexual Activity   Alcohol use: No    Alcohol/week: 0.0 standard drinks of alcohol   Drug use: No   Sexual activity: Not Currently    Birth control/protection: None  Other Topics Concern   Not on file  Social History Narrative   Not on file   Social Determinants of Health   Financial Resource Strain: Low Risk  (02/28/2022)   Overall Financial Resource Strain (CARDIA)    Difficulty of Paying Living Expenses: Not hard at all  Food Insecurity: No Food Insecurity (07/09/2022)   Hunger Vital Sign    Worried About Running Out of Food in the Last Year: Never true    [place] in the Last Year: Never true  Transportation Needs: No Transportation Needs (07/09/2022)   PRAPARE -  Hydrologist (Medical): No    Lack of Transportation (Non-Medical): No  Physical Activity: Inactive (02/28/2022)   Exercise Vital Sign    Days of Exercise per Week: 0 days    Minutes of Exercise per Session: 0 min  Stress: No Stress Concern Present (02/28/2022)   [place]    Feeling of Stress : Only a little  Social Connections: Moderately Integrated (06/22/2022)   Social Connection and Isolation Panel [NHANES]    Frequency of Communication with Friends and Family: More than three times a week    Frequency of Social Gatherings with Friends and Family: More than three times a week    Attends Religious Services: More than 4 times per year    Active Member of [employer] or Organizations: Yes    Attends Archivist Meetings: More than 4 times per year    Marital Status: Widowed  Intimate Partner Violence: Not At Risk (07/09/2022)   Humiliation, Afraid, Rape, and Kick questionnaire    Fear of Current or Ex-Partner: No    Emotionally Abused: No    Physically Abused: No    Sexually Abused: No    FAMILY HISTORY: Family History  Problem Relation Age of Onset   Heart disease Father    Hypertension Father  Hyperlipidemia Father    Breast cancer Neg Hx     Review of Systems  Constitutional:  Positive for fatigue. Negative for appetite change, chills, fever and unexpected weight change.  HENT:   Negative for hearing loss, lump/mass, mouth sores, trouble swallowing and voice change.   Eyes:  Negative for eye problems and icterus.  Respiratory:  Negative for chest tightness, cough and shortness of breath.   Cardiovascular:  Positive for leg swelling. Negative for chest pain and palpitations.  Gastrointestinal:  Negative for abdominal distention, abdominal pain, blood in stool, constipation, diarrhea, nausea, rectal pain and vomiting.  Endocrine: Negative for hot flashes.  Genitourinary:   Negative for difficulty urinating.   Musculoskeletal:  Negative for arthralgias.  Skin:  Negative for itching and rash.  Neurological:  Negative for dizziness, extremity weakness, headaches and numbness.  Hematological:  Negative for adenopathy. Does not bruise/bleed easily.  Psychiatric/Behavioral:  Negative for depression. The patient is not nervous/anxious.       PHYSICAL EXAMINATION  ECOG PERFORMANCE STATUS: 1 - Symptomatic but completely ambulatory  Vitals:   08/18/22 0951  BP: 124/69  Pulse: 75  Resp: 16  Temp: 97.7 F (36.5 C)  SpO2: 99%    Physical Exam Constitutional:      General: She is not in acute distress.    Appearance: Normal appearance. She is not toxic-appearing.  HENT:     Head: Normocephalic and atraumatic.  Eyes:     General: No scleral icterus. Cardiovascular:     Rate and Rhythm: Normal rate.     Pulses: Normal pulses.     Heart sounds: Murmur (Systolic murmur with a click.) heard.  Pulmonary:     Effort: Pulmonary effort is normal.     Breath sounds: No rhonchi.  Musculoskeletal:        General: Swelling (This has significantly improved since last visit.  No concern for cellulitis today) present.     Cervical back: Neck supple.     Right lower leg: Edema (slightly improved) present.     Left lower leg: Edema present.  Lymphadenopathy:     Cervical: No cervical adenopathy.  Skin:    General: Skin is warm and dry.     Findings: Bruising (Small areas of ecchymosis on the left lower leg.) present. No rash.  Neurological:     General: No focal deficit present.     Mental Status: She is alert.  Psychiatric:        Mood and Affect: Mood normal.        Behavior: Behavior normal.     LABORATORY DATA:  CBC    Component Value Date/Time   WBC 9.3 08/18/2022 0918   WBC 13.8 (H) 08/17/2022 1134   RBC 2.99 (L) 08/18/2022 0918   RBC 2.99 (L) 08/18/2022 0917   HGB 9.2 (L) 08/18/2022 0918   HCT 28.7 (L) 08/18/2022 0918   HCT 28.5 (L)  11/20/2018 1824   PLT 154 08/18/2022 0918   MCV 96.0 08/18/2022 0918   MCH 30.8 08/18/2022 0918   MCHC 32.1 08/18/2022 0918   RDW 16.7 (H) 08/18/2022 0918   LYMPHSABS 0.8 08/18/2022 0918   MONOABS 0.6 08/18/2022 0918   EOSABS 0.2 08/18/2022 0918   BASOSABS 0.1 08/18/2022 0918    CMP     Component Value Date/Time   NA 133 (L) 08/18/2022 0918   NA 137 12/06/2017 1436   K 4.3 08/18/2022 0918   CL 103 08/18/2022 0918   CO2 23 08/18/2022 0258  GLUCOSE 275 (H) 08/18/2022 0918   BUN 57 (H) 08/18/2022 0918   BUN 20 12/06/2017 1436   CREATININE 2.44 (H) 08/18/2022 0918   CALCIUM 7.9 (L) 08/18/2022 0918   PROT 5.3 (L) 08/18/2022 0918   ALBUMIN 3.0 (L) 08/18/2022 0918   AST 28 08/18/2022 0918   ALT 17 08/18/2022 0918   ALKPHOS 96 08/18/2022 0918   BILITOT 0.6 08/18/2022 0918   GFRNONAA 19 (L) 08/18/2022 0918   GFRAA 32 (L) 03/30/2020 0932    ASSESSMENT and THERAPY PLAN:    ALETHA ALLEBACH is a 81 y.o. female who presents for multifactorial anemia, previously treated with rituximab for ANCA positive hemolytic anemia, previously on maintenance rituximab every 12 weeks presented with worsening anemia thought to be related to her hemolysis.  She was diagnosed with elevated ANCA titers 1 is to 640 and previously treated for ANCA positive hemolytic anemia, seen by rheumatology Dr. Salome Holmes back in 2020, responded at that time to prednisone and rituximab and continued on rituximab maintenance.  However her rituximab was changed to every 76-monthinfusions and she relapsed before her second cycle of every 359-monthituximab.    # Multifactorial Anemia:  --Bone marrow biopsy from 02/20/2022 showed normocellular marrow with trilineage hematopoiesis. No leukemia, lymphoma or high-grade dysplasia identified. --CT CAP from 02/20/2022 did not show any underlying cause for anemia.  --ANCA titers are normal when checked on 02/10/2022 --Mycophenolate was increased to 1000 mg however this was  discontinued while she was hospitalized -- Currently on prednisone taper, last dose of prednisone planned for tomorrow. Hemoglobin today reviewed, mild drop from about a week ago but reticulocytosis has also resolved hence we will continue to monitor it.  epo injections every other week.  Transfuse for hemoglobin less than 8 g/dL.  #Bilateral lower extremity edema significantly improved compared to last visit.  She continues on Lasix per nephrology.  #Warfarin therapy for [employer]'s valve --Under the care of  [facility].  INR today is 2.5 in therapeutic range, this is managed by cardiology  #CKD:  -- Followed by nephrology.   Follow up: --Patient will return in 2 weeks for labs, visit and erythropoietin injection  Patient and her son expressed understanding and satisfaction with the plan provided.   I have spent a total of 30 minutes minutes of face-to-face and non-face-to-face time, preparing to see the patient, performing a medically appropriate examination, counseling and educating the patient, documenting clinical information in the electronic health record,and care coordination.

## 2022-08-19 LAB — URINE CULTURE: Culture: >=100000 — AB

## 2022-08-20 ENCOUNTER — Telehealth (HOSPITAL_BASED_OUTPATIENT_CLINIC_OR_DEPARTMENT_OTHER): Payer: Self-pay | Admitting: *Deleted

## 2022-08-20 NOTE — Telephone Encounter (Signed)
Post ED Visit - Positive Culture Follow-up  Culture report reviewed by antimicrobial stewardship pharmacist: [facility] Team '[]'$  Elenor Quinones, Pharm.D. '[]'$  Heide Guile, Pharm.D., BCPS AQ-ID '[]'$  Parks Neptune, Pharm.D., BCPS '[]'$  Alycia Rossetti, Pharm.D., BCPS '[]'$  [place], [place].D., BCPS, AAHIVP '[]'$  Legrand Como, Pharm.D., BCPS, AAHIVP '[]'$  [name], PharmD, BCPS '[]'$  Johnnette Gourd, PharmD, BCPS '[]'$  Hughes Better, PharmD, BCPS '[]'$  Leeroy Cha, PharmD '[]'$  Laqueta Linden, PharmD, BCPS '[x]'$  Bertis Ruddy, PharmD  [place] Team '[]'$  Leodis Sias, PharmD '[]'$  Lindell Spar, PharmD '[]'$  Royetta Asal, PharmD '[]'$  Graylin Shiver, Rph '[]'$  Rema Fendt) Glennon Mac, PharmD '[]'$  Arlyn Dunning, PharmD '[]'$  Netta Cedars, PharmD '[]'$  Dia Sitter, PharmD '[]'$  Leone Haven, PharmD '[]'$  Gretta Arab, PharmD '[]'$  Theodis Shove, PharmD '[]'$  Peggyann Juba, PharmD '[]'$  Reuel Boom, PharmD   Positive urine culture Treated with Ciprofloxacin, organism sensitive to the same and no further patient follow-up is required at this time.  Rosie Fate 08/20/2022, 10:30 AM

## 2022-08-22 DIAGNOSIS — I7782 Antineutrophilic cytoplasmic antibody (ANCA) vasculitis: Secondary | ICD-10-CM | POA: Diagnosis not present

## 2022-08-22 DIAGNOSIS — D649 Anemia, unspecified: Secondary | ICD-10-CM | POA: Diagnosis not present

## 2022-08-25 DIAGNOSIS — N184 Chronic kidney disease, stage 4 (severe): Secondary | ICD-10-CM | POA: Diagnosis not present

## 2022-08-25 DIAGNOSIS — I130 Hypertensive heart and chronic kidney disease with heart failure and stage 1 through stage 4 chronic kidney disease, or unspecified chronic kidney disease: Secondary | ICD-10-CM | POA: Diagnosis not present

## 2022-08-25 DIAGNOSIS — I7782 Antineutrophilic cytoplasmic antibody (ANCA) vasculitis: Secondary | ICD-10-CM | POA: Diagnosis not present

## 2022-08-25 DIAGNOSIS — L03116 Cellulitis of left lower limb: Secondary | ICD-10-CM | POA: Diagnosis not present

## 2022-08-25 DIAGNOSIS — I482 Chronic atrial fibrillation, unspecified: Secondary | ICD-10-CM | POA: Diagnosis not present

## 2022-08-25 DIAGNOSIS — I739 Peripheral vascular disease, unspecified: Secondary | ICD-10-CM | POA: Diagnosis not present

## 2022-08-25 DIAGNOSIS — E1151 Type 2 diabetes mellitus with diabetic peripheral angiopathy without gangrene: Secondary | ICD-10-CM | POA: Diagnosis not present

## 2022-08-25 DIAGNOSIS — I5032 Chronic diastolic (congestive) heart failure: Secondary | ICD-10-CM | POA: Diagnosis not present

## 2022-08-25 DIAGNOSIS — L03115 Cellulitis of right lower limb: Secondary | ICD-10-CM | POA: Diagnosis not present

## 2022-08-26 ENCOUNTER — Inpatient Hospital Stay (HOSPITAL_COMMUNITY)
Admission: EM | Admit: 2022-08-26 | Discharge: 2022-09-05 | DRG: 682 | Disposition: A | Discharge status: Skilled Nursing Facility | Payer: Medicare HMO | Attending: Family Medicine | Admitting: Family Medicine

## 2022-08-26 ENCOUNTER — Emergency Department (HOSPITAL_COMMUNITY): Payer: Medicare HMO

## 2022-08-26 ENCOUNTER — Other Ambulatory Visit: Payer: Self-pay

## 2022-08-26 ENCOUNTER — Encounter (HOSPITAL_COMMUNITY): Payer: Self-pay

## 2022-08-26 DIAGNOSIS — Z862 Personal history of diseases of the blood and blood-forming organs and certain disorders involving the immune mechanism: Secondary | ICD-10-CM

## 2022-08-26 DIAGNOSIS — I959 Hypotension, unspecified: Secondary | ICD-10-CM | POA: Diagnosis not present

## 2022-08-26 DIAGNOSIS — I480 Paroxysmal atrial fibrillation: Secondary | ICD-10-CM | POA: Diagnosis present

## 2022-08-26 DIAGNOSIS — D509 Iron deficiency anemia, unspecified: Secondary | ICD-10-CM | POA: Diagnosis not present

## 2022-08-26 DIAGNOSIS — E8809 Other disorders of plasma-protein metabolism, not elsewhere classified: Secondary | ICD-10-CM | POA: Diagnosis present

## 2022-08-26 DIAGNOSIS — Z883 Allergy status to other anti-infective agents status: Secondary | ICD-10-CM

## 2022-08-26 DIAGNOSIS — M109 Gout, unspecified: Secondary | ICD-10-CM | POA: Diagnosis present

## 2022-08-26 DIAGNOSIS — Z66 Do not resuscitate: Secondary | ICD-10-CM | POA: Diagnosis not present

## 2022-08-26 DIAGNOSIS — E1165 Type 2 diabetes mellitus with hyperglycemia: Secondary | ICD-10-CM | POA: Diagnosis present

## 2022-08-26 DIAGNOSIS — Z8669 Personal history of other diseases of the nervous system and sense organs: Secondary | ICD-10-CM

## 2022-08-26 DIAGNOSIS — R0689 Other abnormalities of breathing: Secondary | ICD-10-CM | POA: Diagnosis not present

## 2022-08-26 DIAGNOSIS — N179 Acute kidney failure, unspecified: Secondary | ICD-10-CM | POA: Diagnosis not present

## 2022-08-26 DIAGNOSIS — J9811 Atelectasis: Secondary | ICD-10-CM | POA: Diagnosis present

## 2022-08-26 DIAGNOSIS — R1312 Dysphagia, oropharyngeal phase: Secondary | ICD-10-CM | POA: Diagnosis not present

## 2022-08-26 DIAGNOSIS — I272 Pulmonary hypertension, unspecified: Secondary | ICD-10-CM | POA: Diagnosis present

## 2022-08-26 DIAGNOSIS — K5790 Diverticulosis of intestine, part unspecified, without perforation or abscess without bleeding: Secondary | ICD-10-CM | POA: Diagnosis present

## 2022-08-26 DIAGNOSIS — D631 Anemia in chronic kidney disease: Secondary | ICD-10-CM | POA: Diagnosis present

## 2022-08-26 DIAGNOSIS — E11319 Type 2 diabetes mellitus with unspecified diabetic retinopathy without macular edema: Secondary | ICD-10-CM | POA: Diagnosis present

## 2022-08-26 DIAGNOSIS — D649 Anemia, unspecified: Secondary | ICD-10-CM | POA: Diagnosis not present

## 2022-08-26 DIAGNOSIS — R110 Nausea: Secondary | ICD-10-CM | POA: Diagnosis not present

## 2022-08-26 DIAGNOSIS — E440 Moderate protein-calorie malnutrition: Secondary | ICD-10-CM | POA: Diagnosis not present

## 2022-08-26 DIAGNOSIS — I482 Chronic atrial fibrillation, unspecified: Secondary | ICD-10-CM | POA: Diagnosis not present

## 2022-08-26 DIAGNOSIS — R339 Retention of urine, unspecified: Secondary | ICD-10-CM | POA: Diagnosis not present

## 2022-08-26 DIAGNOSIS — I5033 Acute on chronic diastolic (congestive) heart failure: Secondary | ICD-10-CM | POA: Diagnosis not present

## 2022-08-26 DIAGNOSIS — E1151 Type 2 diabetes mellitus with diabetic peripheral angiopathy without gangrene: Secondary | ICD-10-CM | POA: Diagnosis present

## 2022-08-26 DIAGNOSIS — J90 Pleural effusion, not elsewhere classified: Secondary | ICD-10-CM | POA: Diagnosis not present

## 2022-08-26 DIAGNOSIS — I130 Hypertensive heart and chronic kidney disease with heart failure and stage 1 through stage 4 chronic kidney disease, or unspecified chronic kidney disease: Secondary | ICD-10-CM | POA: Diagnosis present

## 2022-08-26 DIAGNOSIS — E1122 Type 2 diabetes mellitus with diabetic chronic kidney disease: Secondary | ICD-10-CM | POA: Diagnosis present

## 2022-08-26 DIAGNOSIS — Z87891 Personal history of nicotine dependence: Secondary | ICD-10-CM

## 2022-08-26 DIAGNOSIS — Z83438 Family history of other disorder of lipoprotein metabolism and other lipidemia: Secondary | ICD-10-CM

## 2022-08-26 DIAGNOSIS — E869 Volume depletion, unspecified: Secondary | ICD-10-CM | POA: Diagnosis present

## 2022-08-26 DIAGNOSIS — I2721 Secondary pulmonary arterial hypertension: Secondary | ICD-10-CM | POA: Diagnosis not present

## 2022-08-26 DIAGNOSIS — Z7982 Long term (current) use of aspirin: Secondary | ICD-10-CM

## 2022-08-26 DIAGNOSIS — Z9049 Acquired absence of other specified parts of digestive tract: Secondary | ICD-10-CM

## 2022-08-26 DIAGNOSIS — Z7952 Long term (current) use of systemic steroids: Secondary | ICD-10-CM

## 2022-08-26 DIAGNOSIS — I10 Essential (primary) hypertension: Secondary | ICD-10-CM | POA: Diagnosis not present

## 2022-08-26 DIAGNOSIS — J189 Pneumonia, unspecified organism: Secondary | ICD-10-CM | POA: Diagnosis not present

## 2022-08-26 DIAGNOSIS — R739 Hyperglycemia, unspecified: Secondary | ICD-10-CM

## 2022-08-26 DIAGNOSIS — Z515 Encounter for palliative care: Secondary | ICD-10-CM | POA: Diagnosis not present

## 2022-08-26 DIAGNOSIS — N189 Chronic kidney disease, unspecified: Secondary | ICD-10-CM | POA: Diagnosis not present

## 2022-08-26 DIAGNOSIS — D591 Autoimmune hemolytic anemia, unspecified: Secondary | ICD-10-CM | POA: Diagnosis present

## 2022-08-26 DIAGNOSIS — K5900 Constipation, unspecified: Secondary | ICD-10-CM | POA: Diagnosis not present

## 2022-08-26 DIAGNOSIS — Z8249 Family history of ischemic heart disease and other diseases of the circulatory system: Secondary | ICD-10-CM

## 2022-08-26 DIAGNOSIS — R0602 Shortness of breath: Secondary | ICD-10-CM | POA: Diagnosis not present

## 2022-08-26 DIAGNOSIS — Z6824 Body mass index (BMI) 24.0-24.9, adult: Secondary | ICD-10-CM

## 2022-08-26 DIAGNOSIS — I5032 Chronic diastolic (congestive) heart failure: Secondary | ICD-10-CM | POA: Diagnosis not present

## 2022-08-26 DIAGNOSIS — Z79899 Other long term (current) drug therapy: Secondary | ICD-10-CM

## 2022-08-26 DIAGNOSIS — K5730 Diverticulosis of large intestine without perforation or abscess without bleeding: Secondary | ICD-10-CM | POA: Diagnosis not present

## 2022-08-26 DIAGNOSIS — Z952 Presence of prosthetic heart valve: Secondary | ICD-10-CM | POA: Diagnosis not present

## 2022-08-26 DIAGNOSIS — E7800 Pure hypercholesterolemia, unspecified: Secondary | ICD-10-CM | POA: Diagnosis present

## 2022-08-26 DIAGNOSIS — I700 Atherosclerosis of aorta: Secondary | ICD-10-CM | POA: Diagnosis present

## 2022-08-26 DIAGNOSIS — R338 Other retention of urine: Secondary | ICD-10-CM | POA: Diagnosis not present

## 2022-08-26 DIAGNOSIS — I7782 Antineutrophilic cytoplasmic antibody (ANCA) vasculitis: Secondary | ICD-10-CM | POA: Diagnosis not present

## 2022-08-26 DIAGNOSIS — E119 Type 2 diabetes mellitus without complications: Secondary | ICD-10-CM | POA: Diagnosis not present

## 2022-08-26 DIAGNOSIS — R059 Cough, unspecified: Secondary | ICD-10-CM | POA: Diagnosis not present

## 2022-08-26 DIAGNOSIS — N184 Chronic kidney disease, stage 4 (severe): Secondary | ICD-10-CM | POA: Diagnosis present

## 2022-08-26 DIAGNOSIS — Z95820 Peripheral vascular angioplasty status with implants and grafts: Secondary | ICD-10-CM

## 2022-08-26 DIAGNOSIS — Z794 Long term (current) use of insulin: Secondary | ICD-10-CM | POA: Diagnosis not present

## 2022-08-26 DIAGNOSIS — J811 Chronic pulmonary edema: Secondary | ICD-10-CM | POA: Diagnosis not present

## 2022-08-26 DIAGNOSIS — K219 Gastro-esophageal reflux disease without esophagitis: Secondary | ICD-10-CM | POA: Diagnosis not present

## 2022-08-26 DIAGNOSIS — Z888 Allergy status to other drugs, medicaments and biological substances status: Secondary | ICD-10-CM

## 2022-08-26 DIAGNOSIS — Z7901 Long term (current) use of anticoagulants: Secondary | ICD-10-CM

## 2022-08-26 DIAGNOSIS — R2681 Unsteadiness on feet: Secondary | ICD-10-CM | POA: Diagnosis not present

## 2022-08-26 DIAGNOSIS — Z90710 Acquired absence of both cervix and uterus: Secondary | ICD-10-CM

## 2022-08-26 DIAGNOSIS — Z7189 Other specified counseling: Secondary | ICD-10-CM | POA: Diagnosis not present

## 2022-08-26 DIAGNOSIS — E46 Unspecified protein-calorie malnutrition: Secondary | ICD-10-CM | POA: Diagnosis present

## 2022-08-26 DIAGNOSIS — K5909 Other constipation: Secondary | ICD-10-CM | POA: Diagnosis present

## 2022-08-26 DIAGNOSIS — Z951 Presence of aortocoronary bypass graft: Secondary | ICD-10-CM

## 2022-08-26 DIAGNOSIS — Z882 Allergy status to sulfonamides status: Secondary | ICD-10-CM

## 2022-08-26 DIAGNOSIS — I2510 Atherosclerotic heart disease of native coronary artery without angina pectoris: Secondary | ICD-10-CM | POA: Diagnosis present

## 2022-08-26 DIAGNOSIS — M6259 Muscle wasting and atrophy, not elsewhere classified, multiple sites: Secondary | ICD-10-CM | POA: Diagnosis not present

## 2022-08-26 DIAGNOSIS — R0603 Acute respiratory distress: Secondary | ICD-10-CM | POA: Diagnosis not present

## 2022-08-26 DIAGNOSIS — Z9221 Personal history of antineoplastic chemotherapy: Secondary | ICD-10-CM

## 2022-08-26 DIAGNOSIS — Z8719 Personal history of other diseases of the digestive system: Secondary | ICD-10-CM

## 2022-08-26 DIAGNOSIS — D464 Refractory anemia, unspecified: Secondary | ICD-10-CM | POA: Diagnosis present

## 2022-08-26 DIAGNOSIS — I509 Heart failure, unspecified: Secondary | ICD-10-CM

## 2022-08-26 DIAGNOSIS — Z741 Need for assistance with personal care: Secondary | ICD-10-CM | POA: Diagnosis not present

## 2022-08-26 DIAGNOSIS — Z881 Allergy status to other antibiotic agents status: Secondary | ICD-10-CM

## 2022-08-26 DIAGNOSIS — R112 Nausea with vomiting, unspecified: Secondary | ICD-10-CM | POA: Diagnosis present

## 2022-08-26 DIAGNOSIS — M6281 Muscle weakness (generalized): Secondary | ICD-10-CM | POA: Diagnosis not present

## 2022-08-26 DIAGNOSIS — Z90722 Acquired absence of ovaries, bilateral: Secondary | ICD-10-CM

## 2022-08-26 LAB — CBC WITH DIFFERENTIAL/PLATELET
Abs Immature Granulocytes: 0.05 10*3/uL (ref 0.00–0.07)
Basophils Absolute: 0.1 10*3/uL (ref 0.0–0.1)
Basophils Relative: 1 %
Eosinophils Absolute: 0.3 10*3/uL (ref 0.0–0.5)
Eosinophils Relative: 5 %
HCT: 29.6 % — ABNORMAL LOW (ref 36.0–46.0)
Hemoglobin: 9.6 g/dL — ABNORMAL LOW (ref 12.0–15.0)
Immature Granulocytes: 1 %
Lymphocytes Relative: 23 %
Lymphs Abs: 1.6 10*3/uL (ref 0.7–4.0)
MCH: 30.8 pg (ref 26.0–34.0)
MCHC: 32.4 g/dL (ref 30.0–36.0)
MCV: 94.9 fL (ref 80.0–100.0)
Monocytes Absolute: 0.7 10*3/uL (ref 0.1–1.0)
Monocytes Relative: 11 %
Neutro Abs: 4.1 10*3/uL (ref 1.7–7.7)
Neutrophils Relative %: 59 %
Platelets: 163 10*3/uL (ref 150–400)
RBC: 3.12 MIL/uL — ABNORMAL LOW (ref 3.87–5.11)
RDW: 16.7 % — ABNORMAL HIGH (ref 11.5–15.5)
WBC: 6.9 10*3/uL (ref 4.0–10.5)
nRBC: 0 % (ref 0.0–0.2)

## 2022-08-26 LAB — COMPREHENSIVE METABOLIC PANEL
ALT: 15 U/L (ref 0–44)
AST: 30 U/L (ref 15–41)
Albumin: 2.8 g/dL — ABNORMAL LOW (ref 3.5–5.0)
Alkaline Phosphatase: 106 U/L (ref 38–126)
Anion gap: 10 (ref 5–15)
BUN: 50 mg/dL — ABNORMAL HIGH (ref 8–23)
CO2: 23 mmol/L (ref 22–32)
Calcium: 8.7 mg/dL — ABNORMAL LOW (ref 8.9–10.3)
Chloride: 100 mmol/L (ref 98–111)
Creatinine, Ser: 3 mg/dL — ABNORMAL HIGH (ref 0.44–1.00)
GFR, Estimated: 15 mL/min — ABNORMAL LOW (ref >60)
Glucose, Bld: 177 mg/dL — ABNORMAL HIGH (ref 70–99)
Potassium: 4.1 mmol/L (ref 3.5–5.1)
Sodium: 133 mmol/L — ABNORMAL LOW (ref 135–145)
Total Bilirubin: 0.8 mg/dL (ref 0.3–1.2)
Total Protein: 5.1 g/dL — ABNORMAL LOW (ref 6.5–8.1)

## 2022-08-26 MED ORDER — SODIUM CHLORIDE 0.9 % IV BOLUS
1000.0000 mL | Freq: Once | INTRAVENOUS | Status: AC
  Administered 2022-08-27: 1000 mL via INTRAVENOUS

## 2022-08-26 MED ORDER — ONDANSETRON HCL 4 MG/2ML IJ SOLN
4.0000 mg | Freq: Once | INTRAMUSCULAR | Status: AC
  Administered 2022-08-27: 4 mg via INTRAVENOUS
  Filled 2022-08-26: qty 2

## 2022-08-26 NOTE — ED Notes (Signed)
Patient transported to CT 

## 2022-08-26 NOTE — ED Triage Notes (Signed)
Pt BIB [facility] from home. Pt c/o constipation x1 week, states she is now feeling sick to her stomach

## 2022-08-26 NOTE — ED Provider Notes (Signed)
[facility] Emergency Department Provider Note MRN:  [id number]  Arrival date & time: 08/26/22     Chief Complaint   Constipation History of Present Illness   [name] is a 81 y.o. year-old female with a history of A-fib, CAD, PAD, vasculitis presenting to the ED with chief complaint of constipation.  No bowel movements for 1 week.  Started having worsening abdominal discomfort with nausea today.  Also having chronic lower leg pain that she attributes to her vasculitis.  Currently on a prednisone taper.  Denies fever.  Review of Systems  A thorough review of systems was obtained and all systems are negative except as noted in the HPI and PMH.   Patient's Health History    Past Medical History:  Diagnosis Date   Aortic atherosclerosis ([place]) 01/23/2018   Atrial fibrillation (HCC)    Benign positional vertigo 10/06/2011   Carotid artery disease ([place])    Chemotherapy induced neutropenia ([place]) 07/21/2019   Cholelithiasis 01/23/2018   Cholelithiasis 01/23/2018   Chronic anticoagulation    Colovesical fistula, ruled out 04/14/2022   Coronary artery disease    status post coronary artery bypass grafting times 07/10/2004   Diabetes mellitus    Hypercholesteremia    Hypertension    Mechanical heart valve present    H. aortic valve replacement at the time of bypass surgery October 2005   Moderate to severe pulmonary hypertension ([place])    Peripheral arterial disease ([place])    history of left common iliac artery PTA and stenting for a chronic total occlusion 08/26/01   S/P cholecystectomy 03/06/2018    Past Surgical History:  Procedure Laterality Date   anterior repair  2009   AORTIC VALVE REPLACEMENT  2005   Dr. Cyndia Bent   CARDIAC CATHETERIZATION  11/10/2004   40% right common illiac, 70% in stent restenosis of distal left common illiac,    CARDIAC CATHETERIZATION  05/18/2004   LAD 50-70% midstenosis, RCA dominant w/50% stenosis, 50% Right common Illiac  artery ostial stenosis, 90% in stent restenosis within midportion of left common illiac stent   Carotid Duplex  03/12/2012   RSA-elev. velocities suggestive of a 50-69% diameter reduction, Right&Left Bulb/Prox ICA-mild-mod.fibrous plaqueelevating Velocities abnormal study.   CHOLECYSTECTOMY N/A 03/01/2018   Procedure: LAPAROSCOPIC CHOLECYSTECTOMY;  Surgeon: Judeth Horn, MD;  Location: [facility];  Service: General;  Laterality: N/A;   COLONOSCOPY WITH PROPOFOL N/A 01/22/2018   Procedure: COLONOSCOPY WITH PROPOFOL;  Surgeon: Wilford Corner, MD;  Location: [place];  Service: Endoscopy;  Laterality: N/A;   ESOPHAGOGASTRODUODENOSCOPY N/A 07/13/2022   Procedure: ESOPHAGOGASTRODUODENOSCOPY (EGD);  Surgeon: [name], MD;  Location: [facility];  Service: Gastroenterology;  Laterality: N/A;   ESOPHAGOGASTRODUODENOSCOPY (EGD) WITH PROPOFOL N/A 01/22/2018   Procedure: ESOPHAGOGASTRODUODENOSCOPY (EGD) WITH PROPOFOL;  Surgeon: Wilford Corner, MD;  Location: [place];  Service: Endoscopy;  Laterality: N/A;   ESOPHAGOGASTRODUODENOSCOPY (EGD) WITH PROPOFOL N/A 11/21/2018   Procedure: ESOPHAGOGASTRODUODENOSCOPY (EGD) WITH PROPOFOL;  Surgeon: [name], MD;  Location: [place];  Service: Gastroenterology;  Laterality: N/A;   ESOPHAGOGASTRODUODENOSCOPY (EGD) WITH PROPOFOL Left 02/27/2019   Procedure: ESOPHAGOGASTRODUODENOSCOPY (EGD) WITH PROPOFOL;  Surgeon: Arta Silence, MD;  Location: [facility];  Service: Endoscopy;  Laterality: Left;   GIVENS CAPSULE STUDY N/A 11/21/2018   Procedure: GIVENS CAPSULE STUDY;  Surgeon: [name], MD;  Location: [place];  Service: Gastroenterology;  Laterality: N/A;  To be deployed during EGD   Lower Ext. Duplex  03/12/2012   Right Proximal CIA- vessel narrowing w/elevated velocities 0-49%  diameter reduction. Right SFA-mild mixed density plaque throughout vessel.   NM MYOCAR PERF WALL MOTION  05/19/2010   protocol: Persantine, post stress EF 65%, negative  for ischemia, low risk scan   RIGHT HEART CATH N/A 06/27/2018   Procedure: RIGHT HEART CATH;  Surgeon: [name], MD;  Location: [facility];  Service: Cardiovascular;  Laterality: N/A;   TOTAL ABDOMINAL HYSTERECTOMY W/ BILATERAL SALPINGOOPHORECTOMY  1989   TRANSTHORACIC ECHOCARDIOGRAM  08/29/2012   Moderately calcified annulus of mitral valve, moderate regurg. of both mitral valve and tricuspid valve.     Family History  Problem Relation Age of Onset   Heart disease Father    Hypertension Father    Hyperlipidemia Father    Breast cancer Neg Hx     Social History   Socioeconomic History   Marital status: Widowed    Spouse name: Not on file   Number of children: Not on file   Years of education: Not on file   Highest education level: Not on file  Occupational History   Occupation: Retired  Tobacco Use   Smoking status: Former    Packs/day: 1.00    Years: 30.00    Total pack years: 30.00    Types: Cigarettes   Smokeless tobacco: Never   Tobacco comments:    quit smoking 2005  Vaping Use   Vaping Use: Never used  Substance and Sexual Activity   Alcohol use: No    Alcohol/week: 0.0 standard drinks of alcohol   Drug use: No   Sexual activity: Not Currently    Birth control/protection: None  Other Topics Concern   Not on file  Social History Narrative   Not on file   Social Determinants of Health   Financial Resource Strain: Low Risk  (02/28/2022)   Overall Financial Resource Strain (CARDIA)    Difficulty of Paying Living Expenses: Not hard at all  Food Insecurity: No Food Insecurity (07/09/2022)   Hunger Vital Sign    Worried About Running Out of Food in the Last Year: Never true    [place] in the Last Year: Never true  Transportation Needs: No Transportation Needs (07/09/2022)   PRAPARE - Hydrologist (Medical): No    Lack of Transportation (Non-Medical): No  Physical Activity: Inactive (02/28/2022)   Exercise Vital  Sign    Days of Exercise per Week: 0 days    Minutes of Exercise per Session: 0 min  Stress: No Stress Concern Present (02/28/2022)   [place]    Feeling of Stress : Only a little  Social Connections: Moderately Integrated (06/22/2022)   Social Connection and Isolation Panel [NHANES]    Frequency of Communication with Friends and Family: More than three times a week    Frequency of Social Gatherings with Friends and Family: More than three times a week    Attends Religious Services: More than 4 times per year    Active Member of [employer] or Organizations: Yes    Attends Archivist Meetings: More than 4 times per year    Marital Status: Widowed  Intimate Partner Violence: Not At Risk (07/09/2022)   Humiliation, Afraid, Rape, and Kick questionnaire    Fear of Current or Ex-Partner: No    Emotionally Abused: No    Physically Abused: No    Sexually Abused: No     Physical Exam   Vitals:   08/26/22 2158  BP: (!) 150/64  Pulse: 75  Resp: 19  SpO2: 97%    CONSTITUTIONAL: Chronically ill-appearing, NAD NEURO/PSYCH:  Alert and oriented x 3, no focal deficits EYES:  eyes equal and reactive ENT/NECK:  no LAD, no JVD CARDIO: Regular rate, well-perfused, normal S1 and S2 PULM:  CTAB no wheezing or rhonchi GI/GU:  non-distended, non-tender MSK/SPINE:  No gross deformities, no edema SKIN:  no rash, atraumatic   *Additional and/or pertinent findings included in MDM below  Diagnostic and Interventional Summary    EKG Interpretation  Date/Time:    Ventricular Rate:    PR Interval:    QRS Duration:   QT Interval:    QTC Calculation:   R Axis:     Text Interpretation:         Labs Reviewed  CBC WITH DIFFERENTIAL/PLATELET - Abnormal; Notable for the following components:      Result Value   RBC 3.12 (*)    Hemoglobin 9.6 (*)    HCT 29.6 (*)    RDW 16.7 (*)    All other components within normal limits   COMPREHENSIVE METABOLIC PANEL - Abnormal; Notable for the following components:   Sodium 133 (*)    Glucose, Bld 177 (*)    BUN 50 (*)    Creatinine, Ser 3.00 (*)    Calcium 8.7 (*)    Total Protein 5.1 (*)    Albumin 2.8 (*)    GFR, Estimated 15 (*)    All other components within normal limits  URINALYSIS, ROUTINE W REFLEX MICROSCOPIC    CT ABDOMEN PELVIS WO CONTRAST  Final Result    DG Chest Port 1 View  Final Result      Medications  sodium chloride 0.9 % bolus 1,000 mL (has no administration in time range)  ondansetron (ZOFRAN) injection 4 mg (has no administration in time range)     Procedures  /  Critical Care Procedures  ED Course and Medical Decision Making  Initial Impression and Ddx Differential diagnosis includes constipation, impaction, SBO, colonic obstruction, underlying malignancy, intra-abdominal vasculitis given her history.  I unwrapped her legs and overall they look pretty good.  Patient's daughter at bedside confirms that they are improving over the past week.  Past medical/surgical history that increases complexity of ED encounter: Vasculitis, PAD  Interpretation of Diagnostics I personally reviewed the EKG and my interpretation is as follows: A-fib  Labs reveal worsening kidney injury over the past few weeks.  Otherwise no significant blood count or electrolyte disturbance.  Patient Reassessment and Ultimate Disposition/Management     We will admit to medicine given worsening renal function and continued p.o. intolerance.  Patient management required discussion with the following services or consulting groups:  Hospitalist Service  Complexity of Problems Addressed Acute illness or injury that poses threat of life of bodily function  Additional Data Reviewed and Analyzed Further history obtained from: Further history from spouse/family member and Prior labs/imaging results  Additional Factors Impacting ED Encounter Risk Consideration of  hospitalization  Barth Kirks. [place] Small, [place] [email or web address]  Final Clinical Impressions(s) / ED Diagnoses     ICD-10-CM   1. AKI (acute kidney injury) ([place])  N17.9     2. Constipation, unspecified constipation type  K59.00     3. Nausea and vomiting, unspecified vomiting type  R11.2       ED Discharge Orders     None        Discharge Instructions Discussed  with and Provided to Patient:   Discharge Instructions   None      Maudie Flakes, MD 08/26/22 2355

## 2022-08-27 DIAGNOSIS — E8809 Other disorders of plasma-protein metabolism, not elsewhere classified: Secondary | ICD-10-CM

## 2022-08-27 DIAGNOSIS — I482 Chronic atrial fibrillation, unspecified: Secondary | ICD-10-CM

## 2022-08-27 DIAGNOSIS — K5900 Constipation, unspecified: Secondary | ICD-10-CM | POA: Diagnosis not present

## 2022-08-27 DIAGNOSIS — N179 Acute kidney failure, unspecified: Secondary | ICD-10-CM | POA: Diagnosis not present

## 2022-08-27 DIAGNOSIS — E1165 Type 2 diabetes mellitus with hyperglycemia: Secondary | ICD-10-CM | POA: Insufficient documentation

## 2022-08-27 DIAGNOSIS — K219 Gastro-esophageal reflux disease without esophagitis: Secondary | ICD-10-CM | POA: Diagnosis not present

## 2022-08-27 DIAGNOSIS — D509 Iron deficiency anemia, unspecified: Secondary | ICD-10-CM

## 2022-08-27 DIAGNOSIS — R110 Nausea: Secondary | ICD-10-CM | POA: Diagnosis not present

## 2022-08-27 DIAGNOSIS — I7782 Antineutrophilic cytoplasmic antibody (ANCA) vasculitis: Secondary | ICD-10-CM | POA: Diagnosis not present

## 2022-08-27 DIAGNOSIS — E46 Unspecified protein-calorie malnutrition: Secondary | ICD-10-CM | POA: Insufficient documentation

## 2022-08-27 DIAGNOSIS — N189 Chronic kidney disease, unspecified: Secondary | ICD-10-CM

## 2022-08-27 DIAGNOSIS — Z794 Long term (current) use of insulin: Secondary | ICD-10-CM

## 2022-08-27 DIAGNOSIS — M1000 Idiopathic gout, unspecified site: Secondary | ICD-10-CM

## 2022-08-27 DIAGNOSIS — R112 Nausea with vomiting, unspecified: Secondary | ICD-10-CM | POA: Diagnosis present

## 2022-08-27 LAB — CBC
HCT: 31.6 % — ABNORMAL LOW (ref 36.0–46.0)
Hemoglobin: 10.1 g/dL — ABNORMAL LOW (ref 12.0–15.0)
MCH: 30.5 pg (ref 26.0–34.0)
MCHC: 32 g/dL (ref 30.0–36.0)
MCV: 95.5 fL (ref 80.0–100.0)
Platelets: 201 10*3/uL (ref 150–400)
RBC: 3.31 MIL/uL — ABNORMAL LOW (ref 3.87–5.11)
RDW: 17.1 % — ABNORMAL HIGH (ref 11.5–15.5)
WBC: 8.7 10*3/uL (ref 4.0–10.5)
nRBC: 0 % (ref 0.0–0.2)

## 2022-08-27 LAB — GLUCOSE, CAPILLARY
Glucose-Capillary: 182 mg/dL — ABNORMAL HIGH (ref 70–99)
Glucose-Capillary: 220 mg/dL — ABNORMAL HIGH (ref 70–99)
Glucose-Capillary: 202 mg/dL — ABNORMAL HIGH (ref 70–99)
Glucose-Capillary: 138 mg/dL — ABNORMAL HIGH (ref 70–99)

## 2022-08-27 LAB — COMPREHENSIVE METABOLIC PANEL
ALT: 15 U/L (ref 0–44)
AST: 30 U/L (ref 15–41)
Albumin: 2.8 g/dL — ABNORMAL LOW (ref 3.5–5.0)
Alkaline Phosphatase: 112 U/L (ref 38–126)
Anion gap: 8 (ref 5–15)
BUN: 49 mg/dL — ABNORMAL HIGH (ref 8–23)
CO2: 23 mmol/L (ref 22–32)
Calcium: 8.6 mg/dL — ABNORMAL LOW (ref 8.9–10.3)
Chloride: 102 mmol/L (ref 98–111)
Creatinine, Ser: 2.78 mg/dL — ABNORMAL HIGH (ref 0.44–1.00)
GFR, Estimated: 16 mL/min — ABNORMAL LOW (ref >60)
Glucose, Bld: 186 mg/dL — ABNORMAL HIGH (ref 70–99)
Potassium: 4.2 mmol/L (ref 3.5–5.1)
Sodium: 133 mmol/L — ABNORMAL LOW (ref 135–145)
Total Bilirubin: 0.7 mg/dL (ref 0.3–1.2)
Total Protein: 5.1 g/dL — ABNORMAL LOW (ref 6.5–8.1)

## 2022-08-27 LAB — BRAIN NATRIURETIC PEPTIDE: B Natriuretic Peptide: 498 pg/mL — ABNORMAL HIGH (ref 0.0–100.0)

## 2022-08-27 LAB — PHOSPHORUS: Phosphorus: 3.9 mg/dL (ref 2.5–4.6)

## 2022-08-27 LAB — PROTIME-INR
INR: 2.1 — ABNORMAL HIGH (ref 0.8–1.2)
Prothrombin Time: 23.6 seconds — ABNORMAL HIGH (ref 11.4–15.2)

## 2022-08-27 LAB — MAGNESIUM: Magnesium: 2.4 mg/dL (ref 1.7–2.4)

## 2022-08-27 MED ORDER — FAMOTIDINE 20 MG PO TABS
20.0000 mg | ORAL_TABLET | Freq: Every day | ORAL | Status: DC
  Administered 2022-08-27 – 2022-09-04 (×9): 20 mg via ORAL
  Filled 2022-08-27 (×9): qty 1

## 2022-08-27 MED ORDER — HEPARIN SODIUM (PORCINE) 5000 UNIT/ML IJ SOLN: 5000.0000 [IU] | Freq: Three times a day (TID) | INTRAMUSCULAR | Status: DC

## 2022-08-27 MED ORDER — SORBITOL 70 % SOLN
960.0000 mL | TOPICAL_OIL | Freq: Once | ORAL | Status: DC
  Filled 2022-08-27: qty 240

## 2022-08-27 MED ORDER — HYDRALAZINE HCL 25 MG PO TABS
100.0000 mg | ORAL_TABLET | Freq: Three times a day (TID) | ORAL | Status: DC
  Administered 2022-08-27 – 2022-09-05 (×28): 100 mg via ORAL
  Filled 2022-08-27 (×28): qty 4

## 2022-08-27 MED ORDER — ACETAMINOPHEN 325 MG PO TABS
650.0000 mg | ORAL_TABLET | Freq: Four times a day (QID) | ORAL | Status: DC | PRN
  Administered 2022-08-27: 650 mg via ORAL
  Filled 2022-08-27: qty 2

## 2022-08-27 MED ORDER — ALLOPURINOL 100 MG PO TABS
100.0000 mg | ORAL_TABLET | Freq: Every day | ORAL | Status: DC
  Administered 2022-08-27 – 2022-09-05 (×10): 100 mg via ORAL
  Filled 2022-08-27 (×10): qty 1

## 2022-08-27 MED ORDER — WARFARIN - PHARMACIST DOSING INPATIENT: Freq: Every day | Status: DC

## 2022-08-27 MED ORDER — BISACODYL 10 MG RE SUPP
10.0000 mg | Freq: Once | RECTAL | Status: AC
  Administered 2022-08-27: 10 mg via RECTAL
  Filled 2022-08-27: qty 1

## 2022-08-27 MED ORDER — ONDANSETRON HCL 4 MG PO TABS
4.0000 mg | ORAL_TABLET | Freq: Four times a day (QID) | ORAL | Status: DC | PRN
  Administered 2022-08-28 – 2022-09-04 (×4): 4 mg via ORAL
  Filled 2022-08-27 (×4): qty 1

## 2022-08-27 MED ORDER — ALPRAZOLAM 0.25 MG PO TABS
0.2500 mg | ORAL_TABLET | Freq: Two times a day (BID) | ORAL | Status: DC | PRN
  Administered 2022-08-27 – 2022-09-04 (×8): 0.25 mg via ORAL
  Filled 2022-08-27 (×8): qty 1

## 2022-08-27 MED ORDER — FLEET ENEMA 7-19 GM/118ML RE ENEM
1.0000 | ENEMA | Freq: Once | RECTAL | Status: DC
  Administered 2022-08-27: 1 via RECTAL

## 2022-08-27 MED ORDER — POLYETHYLENE GLYCOL 3350 17 G PO PACK
17.0000 g | PACK | Freq: Every day | ORAL | Status: DC
  Administered 2022-08-27: 17 g via ORAL
  Filled 2022-08-27: qty 1

## 2022-08-27 MED ORDER — WARFARIN SODIUM 5 MG PO TABS: 5.0000 mg | ORAL_TABLET | Freq: Once | ORAL | Status: DC

## 2022-08-27 MED ORDER — AMLODIPINE BESYLATE 5 MG PO TABS
10.0000 mg | ORAL_TABLET | Freq: Every day | ORAL | Status: DC
  Administered 2022-08-27 – 2022-09-05 (×10): 10 mg via ORAL
  Filled 2022-08-27 (×10): qty 2

## 2022-08-27 MED ORDER — METOPROLOL SUCCINATE ER 25 MG PO TB24
12.5000 mg | ORAL_TABLET | Freq: Every day | ORAL | Status: DC
  Administered 2022-08-27 – 2022-08-29 (×3): 12.5 mg via ORAL
  Filled 2022-08-27 (×4): qty 1

## 2022-08-27 MED ORDER — SODIUM CHLORIDE 0.9 % IV SOLN: INTRAVENOUS | Status: AC

## 2022-08-27 MED ORDER — OXYCODONE HCL 5 MG PO TABS
5.0000 mg | ORAL_TABLET | ORAL | Status: DC | PRN
  Administered 2022-08-27 – 2022-09-03 (×10): 5 mg via ORAL
  Filled 2022-08-27 (×10): qty 1

## 2022-08-27 MED ORDER — INSULIN ASPART 100 UNIT/ML IJ SOLN
0.0000 [IU] | Freq: Three times a day (TID) | INTRAMUSCULAR | Status: DC
  Administered 2022-08-27: 3 [IU] via SUBCUTANEOUS
  Administered 2022-08-27: 2 [IU] via SUBCUTANEOUS
  Administered 2022-08-27: 3 [IU] via SUBCUTANEOUS
  Administered 2022-08-28 (×2): 2 [IU] via SUBCUTANEOUS
  Administered 2022-08-28: 1 [IU] via SUBCUTANEOUS
  Administered 2022-08-29 (×2): 2 [IU] via SUBCUTANEOUS
  Administered 2022-08-30: 1 [IU] via SUBCUTANEOUS
  Administered 2022-08-30 – 2022-09-01 (×6): 2 [IU] via SUBCUTANEOUS
  Administered 2022-09-02: 1 [IU] via SUBCUTANEOUS
  Administered 2022-09-02 – 2022-09-05 (×4): 2 [IU] via SUBCUTANEOUS

## 2022-08-27 MED ORDER — ATORVASTATIN CALCIUM 40 MG PO TABS
80.0000 mg | ORAL_TABLET | Freq: Every day | ORAL | Status: DC
  Administered 2022-08-27 – 2022-09-04 (×9): 80 mg via ORAL
  Filled 2022-08-27 (×9): qty 2

## 2022-08-27 MED ORDER — FERROUS SULFATE 325 (65 FE) MG PO TABS
325.0000 mg | ORAL_TABLET | Freq: Every day | ORAL | Status: DC
  Administered 2022-08-27 – 2022-09-05 (×10): 325 mg via ORAL
  Filled 2022-08-27 (×10): qty 1

## 2022-08-27 MED ORDER — INSULIN GLARGINE-YFGN 100 UNIT/ML ~~LOC~~ SOLN
10.0000 [IU] | Freq: Every day | SUBCUTANEOUS | Status: DC
  Administered 2022-08-27 – 2022-09-04 (×8): 10 [IU] via SUBCUTANEOUS
  Filled 2022-08-27 (×10): qty 0.1

## 2022-08-27 MED ORDER — ACETAMINOPHEN 650 MG RE SUPP: 650.0000 mg | Freq: Four times a day (QID) | RECTAL | Status: DC | PRN

## 2022-08-27 MED ORDER — ONDANSETRON HCL 4 MG/2ML IJ SOLN
4.0000 mg | Freq: Four times a day (QID) | INTRAMUSCULAR | Status: DC | PRN
  Administered 2022-08-28 – 2022-09-05 (×13): 4 mg via INTRAVENOUS
  Filled 2022-08-27 (×13): qty 2

## 2022-08-27 MED ORDER — INSULIN ASPART 100 UNIT/ML IJ SOLN
0.0000 [IU] | Freq: Every day | INTRAMUSCULAR | Status: DC
  Administered 2022-09-02: 2 [IU] via SUBCUTANEOUS

## 2022-08-27 MED ORDER — GLUCERNA SHAKE PO LIQD
237.0000 mL | Freq: Three times a day (TID) | ORAL | Status: DC
  Administered 2022-08-27 – 2022-08-30 (×6): 237 mL via ORAL

## 2022-08-27 MED ORDER — PANTOPRAZOLE SODIUM 40 MG PO TBEC
40.0000 mg | DELAYED_RELEASE_TABLET | Freq: Two times a day (BID) | ORAL | Status: DC
  Administered 2022-08-27 – 2022-09-05 (×19): 40 mg via ORAL
  Filled 2022-08-27 (×19): qty 1

## 2022-08-27 MED ORDER — WARFARIN SODIUM 7.5 MG PO TABS
7.5000 mg | ORAL_TABLET | Freq: Once | ORAL | Status: AC
  Administered 2022-08-27: 7.5 mg via ORAL
  Filled 2022-08-27: qty 1

## 2022-08-27 MED ORDER — POLYETHYLENE GLYCOL 3350 17 G PO PACK
17.0000 g | PACK | Freq: Two times a day (BID) | ORAL | Status: DC
  Administered 2022-08-27 – 2022-09-01 (×9): 17 g via ORAL
  Filled 2022-08-27 (×16): qty 1

## 2022-08-27 MED ORDER — SENNOSIDES-DOCUSATE SODIUM 8.6-50 MG PO TABS
2.0000 | ORAL_TABLET | Freq: Every day | ORAL | Status: DC
  Administered 2022-08-27 – 2022-09-04 (×8): 2 via ORAL
  Filled 2022-08-27 (×8): qty 2

## 2022-08-27 NOTE — Progress Notes (Signed)
ANTICOAGULATION CONSULT NOTE - Initial Consult  Pharmacy Consult for warfarin Indication: atrial fibrillation/mechanical aortic valve  Allergies  Allergen Reactions   Flagyl [Metronidazole] Rash and Other (See Comments)    ALL-OVER BODY RASH   Augmentin [Amoxicillin-Pot Clavulanate] Diarrhea and Nausea And Vomiting   Ciprofloxacin Diarrhea, Nausea Only and Other (See Comments)    Stomach ache   Coreg [Carvedilol] Other (See Comments)    Terrible cramping in the feet and had a lot of bowel movements, but not diarrhea   Diflucan [Fluconazole] Diarrhea   Losartan Swelling and Other (See Comments)    Patient doesn't recall site of swelling   Privigen [Immune Globulin (Human)] Other (See Comments)    Severe/excruciating pain   Sulfamethoxazole-Trimethoprim Diarrhea, Nausea Only and Other (See Comments)    Stomach upset   Verapamil Hives   Zetia [Ezetimibe] Other (See Comments)    Reaction not recalled   Zocor [Simvastatin - High Dose] Other (See Comments)    Reaction not recalled   Gatifloxacin Rash and Other (See Comments)    Redness to skin around eye    Patient Measurements: Height: 5' (152.4 cm) Weight: 63 kg (138 lb 14.2 oz) IBW/kg (Calculated) : 45.5 Heparin Dosing Weight:   Vital Signs: Temp: 97.3 F (36.3 C) (11/19 0835) Temp Source: Oral (11/19 0835) BP: 178/52 (11/19 0835) Pulse Rate: 85 (11/19 0835)  Labs: Recent Labs    08/26/22 2234 08/27/22 0537 08/27/22 0700  HGB 9.6*  --  10.1*  HCT 29.6*  --  31.6*  PLT 163  --  201  LABPROT  --  23.6*  --   INR  --  2.1*  --   CREATININE 3.00*  --  2.78*    Estimated Creatinine Clearance: 12.7 mL/min (A) (by C-G formula based on SCr of 2.78 mg/dL (H)).   Medical History: Past Medical History:  Diagnosis Date   Aortic atherosclerosis ([place]) 01/23/2018   Atrial fibrillation (HCC)    Benign positional vertigo 10/06/2011   Carotid artery disease ([place])    Chemotherapy induced neutropenia ([place]) 07/21/2019    Cholelithiasis 01/23/2018   Cholelithiasis 01/23/2018   Chronic anticoagulation    Colovesical fistula, ruled out 04/14/2022   Coronary artery disease    status post coronary artery bypass grafting times 07/10/2004   Diabetes mellitus    Hypercholesteremia    Hypertension    Mechanical heart valve present    H. aortic valve replacement at the time of bypass surgery October 2005   Moderate to severe pulmonary hypertension ([place])    Peripheral arterial disease ([place])    history of left common iliac artery PTA and stenting for a chronic total occlusion 08/26/01   S/P cholecystectomy 03/06/2018    Medications:  Medications Prior to Admission  Medication Sig Dispense Refill Last Dose   acetaminophen (TYLENOL) 325 MG tablet Take 325 mg by mouth every 6 (six) hours as needed for mild pain or fever.      alendronate (FOSAMAX) 70 MG tablet Take 70 mg by mouth once a week. Saturdays      allopurinol (ZYLOPRIM) 100 MG tablet Take 100 mg by mouth daily.      ALPRAZolam (XANAX) 0.5 MG tablet Take 1 tablet (0.5 mg total) by mouth at bedtime. 5 tablet 0    amLODipine (NORVASC) 10 MG tablet TAKE 1 TABLET EVERY DAY (Patient taking differently: Take 10 mg by mouth daily.) 90 tablet 3    atorvastatin (LIPITOR) 80 MG tablet Take 1 tablet (80 mg total) by  mouth daily at 6 PM. (Patient taking differently: Take 80 mg by mouth daily.)      Cholecalciferol (VITAMIN D3) 50 MCG (2000 UT) capsule Take 2,000 Units by mouth daily with lunch.      cholestyramine (QUESTRAN) 4 GM/DOSE powder Take 4 g by mouth daily after supper. (Patient not taking: Reported on 08/17/2022)      cloNIDine (CATAPRES) 0.2 MG tablet TAKE 1 TABLET TWICE DAILY 180 tablet 0    Cyanocobalamin (VITAMIN B12) 1000 MCG TBCR Take 1,000 mcg by mouth daily with lunch.      famotidine (PEPCID) 20 MG tablet Take 1 tablet (20 mg total) by mouth 2 (two) times daily. 60 tablet 3    fluconazole (DIFLUCAN) 100 MG tablet Take 1 tablet (100 mg total) by mouth  daily. 6 tablet 0    folic acid (FOLVITE) 1 MG tablet Take 1 mg by mouth daily.      furosemide (LASIX) 40 MG tablet TAKE 2 TABLETS IN MORNING AND 1 AND 1/2 TABLETS IN EVENING. CHANGE IN DOSAGE. 315 tablet 0    hydrALAZINE (APRESOLINE) 100 MG tablet TAKE 1 TABLET THREE TIMES DAILY (Patient taking differently: Take 100 mg by mouth 3 (three) times daily.) 270 tablet 3    HYDROmorphone HCl (DILAUDID) 1 MG/ML LIQD Take 1-2 mLs (1-2 mg total) by mouth every 6 (six) hours as needed for severe pain or moderate pain (use oral medication prior to giving IV). (Patient not taking: Reported on 08/17/2022) 20 mL 0    insulin aspart (NOVOLOG) 100 UNIT/ML injection Take 2 units for blood sugar 200-250, 4 units for blood sugar 252-300, 6 units for blood sugar 301-350, 8 units for blood sugar greater than 350 and call MD. (Patient taking differently: Inject 2-8 Units into the skin 3 (three) times daily with meals. Per sliding scale, If blood sugar is 200 to 250, give 2 units. If blood sugar is 251 to 300, give 4 units. If blood sugar is 301 to 350, give 6 units. If blood sugar is greater than 350, give 8 units and call MD.) 10 mL 11    insulin glargine-yfgn (SEMGLEE) 100 UNIT/ML injection Inject 0.1 mLs (10 Units total) into the skin daily. (Patient taking differently: Inject 0.1 mLs into the skin daily.) 10 mL 11    Insulin Syringe-Needle U-100 (B-D INS SYR ULTRAFINE .3CC/29G) 29G X 1/2" 0.3 ML MISC Use for sliding scale for breakthrough high blood sugar readings 100 each 1    metoprolol succinate (TOPROL-XL) 25 MG 24 hr tablet TAKE 1/2 TABLET EVERY DAY 45 tablet 0    mirabegron ER (MYRBETRIQ) 25 MG TB24 tablet Take 1 tablet (25 mg total) by mouth daily. (Patient not taking: Reported on 08/17/2022) 10 tablet 0    ondansetron (ZOFRAN) 4 MG tablet Take 1 tablet (4 mg total) by mouth every 6 (six) hours as needed for nausea. 20 tablet 0    pantoprazole (PROTONIX) 40 MG tablet Take 40 mg by mouth 2 (two) times daily.       predniSONE (DELTASONE) 5 MG tablet Take 5 mg by mouth daily. Take to more doses      saccharomyces boulardii (FLORASTOR) 250 MG capsule Take 250 mg by mouth 2 (two) times daily.      simethicone (GAS-X) 80 MG chewable tablet Chew 1 tablet (80 mg total) by mouth 4 (four) times daily as needed for flatulence. 100 tablet 2    spironolactone (ALDACTONE) 25 MG tablet TAKE 1 TABLET EVERY DAY (Patient taking differently:  Take 25 mg by mouth daily.) 90 tablet 3    tadalafil, PAH, (ADCIRCA) 20 MG tablet Take 2 tablets (40 mg total) by mouth daily. (Patient taking differently: Take 40 mg by mouth at bedtime.) 60 tablet 11    vitamin C (ASCORBIC ACID) 500 MG tablet Take 500 mg by mouth daily with lunch.      warfarin (COUMADIN) 5 MG tablet TAKE 1 TO 1 AND 1/2 TABLETS DAILY AS DIRECTED BY COUMADIN CLINIC (Patient taking differently: Take 1 tablet ever day except for on Sundays 1 and 1/2 tablets) 135 tablet 10     Assessment: Pharmacy consulted to dose warfarin in patient with atrial fibrillation and mechanical aortic valve.  Patient's INR on admission is 2.1 and home dose listed as 7.5 mg on Sunday and 5 mg ROW.  Goal of Therapy:  INR 2-3 Monitor platelets by anticoagulation protocol: Yes   Plan:  Warfarin 7.5 mg x 1 dose. Monitor daily INR and s/s of bleeding  Margot Ables, PharmD Clinical Pharmacist 08/27/2022 8:52 AM

## 2022-08-27 NOTE — Progress Notes (Signed)
Patient seen and evaluated, chart reviewed, please see EMR for updated orders. Please see full H&P dictated by admitting physician Dr. Josephine Cables for same date of service.   Brief Summary:- 81 y.o. female with medical history significant of  history of mechanical aortic valve on chronic Coumadin, ANCA positive vasculitis on prednisone, chronic A-fib, chronic diastolic heart failure, CKD stage IV (baseline creatinine 1.9-2.1), type 2 diabetes mellitus admitted with abd pain/constipation and concerns for AKI on CKD IV   A/p Acute kidney injury superimposed on CKD stage IV On admission BUN/creatinine 50/3.0 (baseline creatinine at 1.9-2.1) -Creatinine trending down with hydration Renally adjust medications, avoid nephrotoxic agents/dehydration/hypotension   Acute on chronic constipation--- now with nausea and abdominal pain -On admission CT abdomen and pelvis without acute findings -Patient requesting enema Continue MiraLAX, Senokot added  Chronic iron deficiency anemia--- suspect patient may also have some degree of anemia of CKD -Hgb currently stable close to baseline above 9 Continue ferrous sulfate   T2DM with hyperglycemia Continue Semglee 10 units and adjust dose accordingly Use Novolog/Humalog Sliding scale insulin with Accu-Cheks/Fingersticks as ordered    ANCA Vasculitis Rash on legs erupted bilaterally after prednisone taper -Low index of suspicion for superimposed cellulitis   Chronic atrial fibrillation Continue Toprol for rate control ----Stroke prophylaxis with Coumadin -Echo from 05/24/2021 with EF of 55 to 60% with indeterminate diagnostic parameters   GERD Continue Pepcid, Protonix   Essential hypertension Continue Toprol-XL, hydralazine, amlodipine   Gout Continue allopurinol  Patient seen and evaluated, chart reviewed, please see EMR for updated orders. Please see full H&P dictated by admitting physician Dr. Josephine Cables for same date of service.   Total care time  over 56 minutes  Roxan Hockey, MD

## 2022-08-27 NOTE — Care Management Obs Status (Signed)
[facility] NOTIFICATION   Patient Details  Name: [name] MRN: [id number] Date of Birth: Apr 26, 1941   Medicare Observation Status Notification Given:  Yes    Boneta Lucks, RN 08/27/2022, 4:07 PM

## 2022-08-27 NOTE — Progress Notes (Signed)
Patient has had several small to medium stools this shift. Patient continues to complain of pain to rectum, PRN given for pain. Repositioned patient for comfort.

## 2022-08-27 NOTE — H&P (Addendum)
History and Physical    Patient: [name] GGY:[id number] DOB: 1941-07-18 DOA: 08/26/2022 DOS: the patient was seen and examined on 08/27/2022 PCP: [name], [name], MD  Patient coming from: Home  Chief Complaint: No chief complaint on file.  HPI: ROSELY FERNANDEZ is a 81 y.o. female with medical history significant of  history of mechanical aortic valve on chronic Coumadin, ANCA positive vasculitis on prednisone, chronic A-fib, chronic diastolic heart failure, CKD stage IV (baseline creatinine 1.9-2.1), type 2 diabetes mellitus who presents to the emergency department via EMS from home due to 1 week onset of constipation with complaint of abdominal discomfort with nausea which started yesterday.  She has history of ANCA vasculitis and was on prednisone since last March, she was recently taking off the prednisone with subsequent reddening of the chronic lower leg pain.  Per daughter at bedside, the red rash on both legs (right leg was wrapped in Ace bandage) was currently being treated as cellulitis with doxycycline.  Patient does not drink much fluid per daughter at bedside.  ED Course:  In the emergency department, BP was 150/64, other vital signs were within normal range.  Work-up in the ED showed normocytic anemia, sodium 133, potassium 4.1, chloride 100, bicarb 23, blood glucose 177, BUN/creatinine 50/3.0 (baseline creatinine at 1.9-2.1) CT abdomen pelvis showed no acute abnormality of the abdomen or pelvis. Small pleural effusions with bibasilar atelectasis.  Rectosigmoid diverticulosis without acute inflammation. Zofran was given, IV hydration was provided.  Hospitalist was asked to admit patient for further evaluation and management.  Review of Systems: Review of systems as noted in the HPI. All other systems reviewed and are negative.   Past Medical History:  Diagnosis Date   Aortic atherosclerosis ([place]) 01/23/2018   Atrial fibrillation (HCC)    Benign positional vertigo  10/06/2011   Carotid artery disease ([place])    Chemotherapy induced neutropenia ([place]) 07/21/2019   Cholelithiasis 01/23/2018   Cholelithiasis 01/23/2018   Chronic anticoagulation    Colovesical fistula, ruled out 04/14/2022   Coronary artery disease    status post coronary artery bypass grafting times 07/10/2004   Diabetes mellitus    Hypercholesteremia    Hypertension    Mechanical heart valve present    H. aortic valve replacement at the time of bypass surgery October 2005   Moderate to severe pulmonary hypertension ([place])    Peripheral arterial disease ([place])    history of left common iliac artery PTA and stenting for a chronic total occlusion 08/26/01   S/P cholecystectomy 03/06/2018   Past Surgical History:  Procedure Laterality Date   anterior repair  2009   AORTIC VALVE REPLACEMENT  2005   Dr. Cyndia Bent   CARDIAC CATHETERIZATION  11/10/2004   40% right common illiac, 70% in stent restenosis of distal left common illiac,    CARDIAC CATHETERIZATION  05/18/2004   LAD 50-70% midstenosis, RCA dominant w/50% stenosis, 50% Right common Illiac artery ostial stenosis, 90% in stent restenosis within midportion of left common illiac stent   Carotid Duplex  03/12/2012   RSA-elev. velocities suggestive of a 50-69% diameter reduction, Right&Left Bulb/Prox ICA-mild-mod.fibrous plaqueelevating Velocities abnormal study.   CHOLECYSTECTOMY N/A 03/01/2018   Procedure: LAPAROSCOPIC CHOLECYSTECTOMY;  Surgeon: Judeth Horn, MD;  Location: [place];  Service: General;  Laterality: N/A;   COLONOSCOPY WITH PROPOFOL N/A 01/22/2018   Procedure: COLONOSCOPY WITH PROPOFOL;  Surgeon: Wilford Corner, MD;  Location: [place];  Service: Endoscopy;  Laterality: N/A;   ESOPHAGOGASTRODUODENOSCOPY N/A 07/13/2022   Procedure:  ESOPHAGOGASTRODUODENOSCOPY (EGD);  Surgeon: [name], MD;  Location: [facility];  Service: Gastroenterology;  Laterality: N/A;   ESOPHAGOGASTRODUODENOSCOPY (EGD) WITH PROPOFOL N/A 01/22/2018    Procedure: ESOPHAGOGASTRODUODENOSCOPY (EGD) WITH PROPOFOL;  Surgeon: Wilford Corner, MD;  Location: [place];  Service: Endoscopy;  Laterality: N/A;   ESOPHAGOGASTRODUODENOSCOPY (EGD) WITH PROPOFOL N/A 11/21/2018   Procedure: ESOPHAGOGASTRODUODENOSCOPY (EGD) WITH PROPOFOL;  Surgeon: [name], MD;  Location: [place];  Service: Gastroenterology;  Laterality: N/A;   ESOPHAGOGASTRODUODENOSCOPY (EGD) WITH PROPOFOL Left 02/27/2019   Procedure: ESOPHAGOGASTRODUODENOSCOPY (EGD) WITH PROPOFOL;  Surgeon: Arta Silence, MD;  Location: [facility] END[facility];  Service: Endoscopy;  Laterality: Left;   GIVENS CAPSULE STUDY N/A 11/21/2018   Procedure: GIVENS CAPSULE STUDY;  Surgeon: [name], MD;  Location: [place];  Service: Gastroenterology;  Laterality: N/A;  To be deployed during EGD   Lower Ext. Duplex  03/12/2012   Right Proximal CIA- vessel narrowing w/elevated velocities 0-49% diameter reduction. Right SFA-mild mixed density plaque throughout vessel.   NM MYOCAR PERF WALL MOTION  05/19/2010   protocol: Persantine, post stress EF 65%, negative for ischemia, low risk scan   RIGHT HEART CATH N/A 06/27/2018   Procedure: RIGHT HEART CATH;  Surgeon: [name], MD;  Location: [place] [facility];  Service: Cardiovascular;  Laterality: N/A;   TOTAL ABDOMINAL HYSTERECTOMY W/ BILATERAL SALPINGOOPHORECTOMY  1989   TRANSTHORACIC ECHOCARDIOGRAM  08/29/2012   Moderately calcified annulus of mitral valve, moderate regurg. of both mitral valve and tricuspid valve.     Social History:  reports that she has quit smoking. Her smoking use included cigarettes. She has a 30.00 pack-year smoking history. She has never used smokeless tobacco. She reports that she does not drink alcohol and does not use drugs.   Allergies  Allergen Reactions   Flagyl [Metronidazole] Rash and Other (See Comments)    ALL-OVER BODY RASH   Augmentin [Amoxicillin-Pot Clavulanate] Diarrhea and Nausea And Vomiting    Ciprofloxacin Diarrhea, Nausea Only and Other (See Comments)    Stomach ache   Coreg [Carvedilol] Other (See Comments)    Terrible cramping in the feet and had a lot of bowel movements, but not diarrhea   Diflucan [Fluconazole] Diarrhea   Losartan Swelling and Other (See Comments)    Patient doesn't recall site of swelling   Privigen [Immune Globulin (Human)] Other (See Comments)    Severe/excruciating pain   Sulfamethoxazole-Trimethoprim Diarrhea, Nausea Only and Other (See Comments)    Stomach upset   Verapamil Hives   Zetia [Ezetimibe] Other (See Comments)    Reaction not recalled   Zocor [Simvastatin - High Dose] Other (See Comments)    Reaction not recalled   Gatifloxacin Rash and Other (See Comments)    Redness to skin around eye    Family History  Problem Relation Age of Onset   Heart disease Father    Hypertension Father    Hyperlipidemia Father    Breast cancer Neg Hx      Prior to Admission medications   Medication Sig Start Date End Date Taking? Authorizing Provider  acetaminophen (TYLENOL) 325 MG tablet Take 325 mg by mouth every 6 (six) hours as needed for mild pain or fever.    [provider]  alendronate (FOSAMAX) 70 MG tablet Take 70 mg by mouth once a week. Saturdays 12/06/21   [provider]  allopurinol (ZYLOPRIM) 100 MG tablet Take 100 mg by mouth daily.    [provider]  ALPRAZolam Duanne Moron) 0.5 MG tablet Take 1 tablet (  0.5 mg total) by mouth at bedtime. 07/14/22   Sheikh, Omair Latif, DO  amLODipine (NORVASC) 10 MG tablet TAKE 1 TABLET EVERY DAY Patient taking differently: Take 10 mg by mouth daily. 05/17/22   [name], MD  atorvastatin (LIPITOR) 80 MG tablet Take 1 tablet (80 mg total) by mouth daily at 6 PM. Patient taking differently: Take 80 mg by mouth daily. 11/22/18   [name], MD  cefdinir (OMNICEF) 300 MG capsule Take 300 mg by mouth 2 (two) times daily. Patient not taking: Reported on 08/17/2022 08/07/22    [provider]  Cholecalciferol (VITAMIN D3) 50 MCG (2000 UT) capsule Take 2,000 Units by mouth daily with lunch. 11/26/18   [provider]  cholestyramine Lucrezia Starch) 4 GM/DOSE powder Take 4 g by mouth daily after supper. Patient not taking: Reported on 08/17/2022 06/07/22   [provider]  cloNIDine (CATAPRES) 0.2 MG tablet TAKE 1 TABLET TWICE DAILY 08/16/22   [name], MD  Cyanocobalamin (VITAMIN B12) 1000 MCG TBCR Take 1,000 mcg by mouth daily with lunch.    [provider]  estradiol (ESTRACE) 0.1 MG/GM vaginal cream Place 0.5g nightly for two weeks then twice a week after Patient not taking: Reported on 08/17/2022 09/19/21   Jaquita Folds, MD  famotidine (PEPCID) 20 MG tablet Take 1 tablet (20 mg total) by mouth 2 (two) times daily. 04/10/22   Benay Pike, MD  fluconazole (DIFLUCAN) 100 MG tablet Take 1 tablet (100 mg total) by mouth daily. 07/15/22   Raiford Noble Latif, DO  folic acid (FOLVITE) 1 MG tablet Take 1 mg by mouth daily.    [provider]  furosemide (LASIX) 20 MG tablet Take 60-100 mg by mouth See admin instructions. Two entries on MAR for furosemide '20mg'$ . Take 3 tablets ('60mg'$ ) by mouth every evening. Take 5 tablets ('100mg'$ ) once a day. Patient not taking: Reported on 08/17/2022 07/14/22   [provider]  furosemide (LASIX) 40 MG tablet TAKE 2 TABLETS IN MORNING AND 1 AND 1/2 TABLETS IN EVENING. CHANGE IN DOSAGE. 08/16/22   Clegg, Amy D, NP  hydrALAZINE (APRESOLINE) 100 MG tablet TAKE 1 TABLET THREE TIMES DAILY Patient taking differently: Take 100 mg by mouth 3 (three) times daily. 02/17/22   [name], MD  HYDROmorphone HCl (DILAUDID) 1 MG/ML LIQD Take 1-2 mLs (1-2 mg total) by mouth every 6 (six) hours as needed for severe pain or moderate pain (use oral medication prior to giving IV). Patient not taking: Reported on 08/17/2022 07/14/22   Raiford Noble Latif, DO  insulin aspart (NOVOLOG) 100 UNIT/ML  injection Take 2 units for blood sugar 200-250, 4 units for blood sugar 252-300, 6 units for blood sugar 301-350, 8 units for blood sugar greater than 350 and call MD. Patient taking differently: Inject 2-8 Units into the skin 3 (three) times daily with meals. Per sliding scale, If blood sugar is 200 to 250, give 2 units. If blood sugar is 251 to 300, give 4 units. If blood sugar is 301 to 350, give 6 units. If blood sugar is greater than 350, give 8 units and call MD. 02/23/22   Gardenia Phlegm, NP  insulin glargine-yfgn (SEMGLEE) 100 UNIT/ML injection Inject 0.1 mLs (10 Units total) into the skin daily. Patient taking differently: Inject 0.1 mLs into the skin daily. 07/15/22   Sheikh, Georgina Quint Latif, DO  Insulin Syringe-Needle U-100 (B-D INS SYR ULTRAFINE .3CC/29G) 29G X 1/2" 0.3 ML MISC Use for sliding scale for  breakthrough high blood sugar readings 02/24/22   Benay Pike, MD  lidocaine (XYLOCAINE) 2 % solution Use as directed 15 mLs in the mouth or throat as needed for mouth pain. Patient not taking: Reported on 08/17/2022 01/30/22   Benay Pike, MD  Menthol, Topical Analgesic, (BIOFREEZE) 4 % GEL Apply 1 Application topically 3 (three) times daily. 1 application topically to lower back and BLE Patient not taking: Reported on 08/17/2022    [provider]  metoprolol succinate (TOPROL-XL) 25 MG 24 hr tablet TAKE 1/2 TABLET EVERY DAY 08/16/22   [name], MD  mirabegron ER (MYRBETRIQ) 25 MG TB24 tablet Take 1 tablet (25 mg total) by mouth daily. Patient not taking: Reported on 08/17/2022 04/17/22   Daleen Bo, MD  nystatin-alum & mag hydroxide-simeth-diphenhydrAMINE Swish 5 mLs by mouth 3 times daily then spit Patient not taking: Reported on 08/17/2022 05/15/22   Barrie Folk, PA-C  ondansetron (ZOFRAN) 4 MG tablet Take 1 tablet (4 mg total) by mouth every 6 (six) hours as needed for nausea. 07/14/22   Sheikh, Omair Latif, DO  pantoprazole (PROTONIX) 40 MG tablet  Take 40 mg by mouth 2 (two) times daily. 06/25/22   [provider]  predniSONE (DELTASONE) 5 MG tablet Take 5 mg by mouth daily. Take to more doses 07/17/22   [provider]  saccharomyces boulardii (FLORASTOR) 250 MG capsule Take 250 mg by mouth 2 (two) times daily.    [provider]  simethicone (GAS-X) 80 MG chewable tablet Chew 1 tablet (80 mg total) by mouth 4 (four) times daily as needed for flatulence. 04/16/22 04/16/23  DanfordSuann Larry, MD  spironolactone (ALDACTONE) 25 MG tablet TAKE 1 TABLET EVERY DAY Patient taking differently: Take 25 mg by mouth daily. 05/17/22   [name], MD  tadalafil, PAH, (ADCIRCA) 20 MG tablet Take 2 tablets (40 mg total) by mouth daily. Patient taking differently: Take 40 mg by mouth at bedtime. 12/06/21   [name], MD  vitamin C (ASCORBIC ACID) 500 MG tablet Take 500 mg by mouth daily with lunch.    [provider]  warfarin (COUMADIN) 5 MG tablet TAKE 1 TO 1 AND 1/2 TABLETS DAILY AS DIRECTED BY COUMADIN CLINIC Patient taking differently: Take 1 tablet ever day except for on Sundays 1 and 1/2 tablets 07/17/22   Lorretta Harp, MD    Physical Exam: BP (!) 152/46 (BP Location: Left Arm)   Pulse 68   Temp 97.6 F (36.4 C) (Oral)   Resp 16   Ht 5' (1.524 m)   Wt 63 kg   SpO2 97%   BMI 27.13 kg/m   General: 81 y.o. year-old female ill appearing, but in no acute distress.  Alert and oriented x3. HEENT: NCAT, EOMI Neck: Supple, trachea medial Cardiovascular: Irregular rate and rhythm with no rubs or gallops.  Loud, high-frequency metallic sound on auscultation (patient has aortic valve replacement).  No thyromegaly or JVD noted.  2/4 pulses in all 4 extremities. Respiratory: Clear to auscultation with no wheezes or rales. Good inspiratory effort. Abdomen: Soft, nontender nondistended with normal bowel sounds x4 quadrants. Muskuloskeletal: No cyanosis, clubbing noted bilaterally Neuro: CN II-XII  intact, sensation, reflexes intact Skin: Erythematous rash and swelling of left leg, right leg was wrapped in Ace bandage. Psychiatry: Mood is appropriate for condition and setting          Labs on Admission:  Basic Metabolic Panel: Recent Labs  Lab 08/26/22 2234  NA 133*  K 4.1  CL 100  CO2 23  GLUCOSE 177*  BUN 50*  CREATININE 3.00*  CALCIUM 8.7*   Liver Function Tests: Recent Labs  Lab 08/26/22 2234  AST 30  ALT 15  ALKPHOS 106  BILITOT 0.8  PROT 5.1*  ALBUMIN 2.8*   No results for input(s): "LIPASE", "AMYLASE" in the last 168 hours. No results for input(s): "AMMONIA" in the last 168 hours. CBC: Recent Labs  Lab 08/26/22 2234  WBC 6.9  NEUTROABS 4.1  HGB 9.6*  HCT 29.6*  MCV 94.9  PLT 163   Cardiac Enzymes: No results for input(s): "CKTOTAL", "CKMB", "CKMBINDEX", "TROPONINI" in the last 168 hours.  BNP (last 3 results) Recent Labs    02/04/22 1730 04/12/22 1716 08/04/22 1658  BNP 253.4* 61.5 249.3*    ProBNP (last 3 results) No results for input(s): "PROBNP" in the last 8760 hours.  CBG: No results for input(s): "GLUCAP" in the last 168 hours.  Radiological Exams on Admission: CT ABDOMEN PELVIS WO CONTRAST  Result Date: 08/26/2022 CLINICAL DATA:  Bowel obstruction suspected EXAM: CT ABDOMEN AND PELVIS WITHOUT CONTRAST TECHNIQUE: Multidetector CT imaging of the abdomen and pelvis was performed following the standard protocol without IV contrast. RADIATION DOSE REDUCTION: This exam was performed according to the departmental dose-optimization program which includes automated exposure control, adjustment of the mA and/or kV according to patient size and/or use of iterative reconstruction technique. COMPARISON:  08/17/2022 FINDINGS: Lower Chest: Small pleural effusions with bibasilar atelectasis. Unchanged left basilar calcification Hepatobiliary: Normal hepatic contours. No intra- or extrahepatic biliary dilatation. Status post cholecystectomy.  Pancreas: Normal pancreas. No ductal dilatation or peripancreatic fluid collection. Spleen: Numerous calcified granulomata. Adrenals/Urinary Tract: The adrenal glands are normal. No hydronephrosis, nephroureterolithiasis or solid renal mass. The urinary bladder is normal for degree of distention Stomach/Bowel: There is no hiatal hernia. Normal duodenal course and caliber. No small bowel dilatation or inflammation. Rectosigmoid diverticulosis without acute inflammation. The appendix is not visualized. No right lower quadrant inflammation or free fluid. Vascular/Lymphatic: There is calcific atherosclerosis of the abdominal aorta. No lymphadenopathy. Reproductive: Status post hysterectomy. No adnexal mass. Other: None. Musculoskeletal: Chronic compression deformity of L4 IMPRESSION: 1. No acute abnormality of the abdomen or pelvis. 2. Small pleural effusions with bibasilar atelectasis. 3. Rectosigmoid diverticulosis without acute inflammation. Aortic Atherosclerosis (ICD10-I70.0). Electronically Signed   By: Ulyses Jarred M.D.   On: 08/26/2022 23:43   DG Chest Port 1 View  Result Date: 08/26/2022 CLINICAL DATA:  Cough EXAM: PORTABLE CHEST 1 VIEW COMPARISON:  Chest x-ray 08/04/2022 FINDINGS: Patient is status post cardiac surgery. The heart is enlarged. There central pulmonary vascular congestion. Calcified granulomas are again seen bilaterally. There is no pleural effusion or pneumothorax. No acute fractures are seen. IMPRESSION: Cardiomegaly with central pulmonary vascular congestion. Electronically Signed   By: Ronney Asters M.D.   On: 08/26/2022 23:35    EKG: I independently viewed the EKG done and my findings are as followed: Atrial fibrillation with rate control  Assessment/Plan Present on Admission:  Acute kidney injury superimposed on chronic kidney disease ([place])  Iron deficiency anemia  Chronic atrial fibrillation  Essential hypertension  GERD without esophagitis  Principal Problem:   Acute  kidney injury superimposed on chronic kidney disease ([place]) Active Problems:   Essential hypertension   GERD without esophagitis   Chronic atrial fibrillation   Iron deficiency anemia   Constipation   Nausea   Hypoalbuminemia due to protein-calorie malnutrition (HCC)   Type 2 diabetes mellitus with hyperglycemia (  [place])   Acute kidney injury superimposed on CKD stage IV BUN/creatinine 50/3.0 (baseline creatinine at 1.9-2.1) Continue gentle hydration Renally adjust medications, avoid nephrotoxic agents/dehydration/hypotension  Constipation Continue MiraLAX  Nausea Continue Zofran as needed  Iron deficiency anemia Continue ferrous sulfate  T2DM with hyperglycemia Continue Semglee 10 units and adjust dose accordingly Continue ISS and hypoglycemia protocol  ANCA Vasculitis Rash on legs erupted bilaterally after prednisone taper No obvious sign of infection.  Continue to monitor and treat accordingly  Hypoalbuminemia possibly secondary to moderate protein calorie malnutrition Albumin 2.8, protein supplement to be provided  Chronic atrial fibrillation Continue Toprol and Coumadin Continue to monitor INR  Chronic diastolic heart failure Continue metoprolol XL, Lipitor  GERD Continue Pepcid, Protonix  Essential hypertension Continue Toprol-XL, hydralazine, amlodipine  Gout Continue allopurinol   DVT prophylaxis: Coumadin  Code Status: Full code  Family Communication: Daughter at bedside (all questions answered to satisfaction)  Consults: None  Severity of Illness: The appropriate patient status for this patient is OBSERVATION. Observation status is judged to be reasonable and necessary in order to provide the required intensity of service to ensure the patient's safety. [name] presenting symptoms, physical exam findings, and initial radiographic and laboratory data in the context of their medical condition is felt to place them at decreased risk for further  clinical deterioration. Furthermore, it is anticipated that the patient will be medically stable for discharge from the hospital within 2 midnights of admission.   Author: Bernadette Hoit, DO 08/27/2022 2:21 AM  For on call review [email or web address].

## 2022-08-27 NOTE — Progress Notes (Signed)
Patient moaning and yelling out in pain 10/10 at rectum. Patient requesting medication for pain and enema, family at bedside. MD Courage made aware.

## 2022-08-27 NOTE — TOC Progression Note (Signed)
  Transition of Care [facility]) Screening Note   Patient Details  Name: [name] Date of Birth: 1941/02/24   Transition of Care [facility]) CM/SW Contact:    Boneta Lucks, RN Phone Number: 08/27/2022, 4:08 PM    Transition of Care Department [facility]) has reviewed patient and no TOC needs have been identified at this time. We will continue to monitor patient advancement through interdisciplinary progression rounds. If new patient transition needs arise, please place a TOC consult.     Barriers to Discharge: Continued Medical Work up

## 2022-08-28 DIAGNOSIS — N189 Chronic kidney disease, unspecified: Secondary | ICD-10-CM | POA: Diagnosis not present

## 2022-08-28 DIAGNOSIS — R338 Other retention of urine: Secondary | ICD-10-CM | POA: Diagnosis present

## 2022-08-28 DIAGNOSIS — N179 Acute kidney failure, unspecified: Secondary | ICD-10-CM | POA: Diagnosis not present

## 2022-08-28 LAB — RENAL FUNCTION PANEL
Albumin: 2.8 g/dL — ABNORMAL LOW (ref 3.5–5.0)
Anion gap: 7 (ref 5–15)
BUN: 39 mg/dL — ABNORMAL HIGH (ref 8–23)
CO2: 21 mmol/L — ABNORMAL LOW (ref 22–32)
Calcium: 8.6 mg/dL — ABNORMAL LOW (ref 8.9–10.3)
Chloride: 107 mmol/L (ref 98–111)
Creatinine, Ser: 2.24 mg/dL — ABNORMAL HIGH (ref 0.44–1.00)
GFR, Estimated: 21 mL/min — ABNORMAL LOW (ref >60)
Glucose, Bld: 159 mg/dL — ABNORMAL HIGH (ref 70–99)
Phosphorus: 4 mg/dL (ref 2.5–4.6)
Potassium: 4.4 mmol/L (ref 3.5–5.1)
Sodium: 135 mmol/L (ref 135–145)

## 2022-08-28 LAB — GLUCOSE, CAPILLARY
Glucose-Capillary: 156 mg/dL — ABNORMAL HIGH (ref 70–99)
Glucose-Capillary: 164 mg/dL — ABNORMAL HIGH (ref 70–99)
Glucose-Capillary: 145 mg/dL — ABNORMAL HIGH (ref 70–99)
Glucose-Capillary: 151 mg/dL — ABNORMAL HIGH (ref 70–99)

## 2022-08-28 LAB — URINALYSIS, ROUTINE W REFLEX MICROSCOPIC
Bacteria, UA: NONE SEEN
Bilirubin Urine: NEGATIVE
Glucose, UA: 50 mg/dL — AB
Hgb urine dipstick: NEGATIVE
Ketones, ur: NEGATIVE mg/dL
Leukocytes,Ua: NEGATIVE
Nitrite: NEGATIVE
Protein, ur: >=300 mg/dL — AB
Specific Gravity, Urine: 1.01 (ref 1.005–1.030)
pH: 6 (ref 5.0–8.0)

## 2022-08-28 LAB — PROTIME-INR
INR: 1.7 — ABNORMAL HIGH (ref 0.8–1.2)
Prothrombin Time: 19.5 seconds — ABNORMAL HIGH (ref 11.4–15.2)

## 2022-08-28 MED ORDER — WARFARIN SODIUM 5 MG PO TABS
6.0000 mg | ORAL_TABLET | Freq: Once | ORAL | Status: AC
  Administered 2022-08-28: 6 mg via ORAL
  Filled 2022-08-28: qty 1

## 2022-08-28 MED ORDER — TAMSULOSIN HCL 0.4 MG PO CAPS
0.4000 mg | ORAL_CAPSULE | Freq: Every day | ORAL | Status: DC
  Administered 2022-08-28: 0.4 mg via ORAL
  Filled 2022-08-28 (×2): qty 1

## 2022-08-28 NOTE — Progress Notes (Signed)
ANTICOAGULATION CONSULT NOTE -   Pharmacy Consult for warfarin Indication: atrial fibrillation/mechanical aortic valve  Allergies  Allergen Reactions   Flagyl [Metronidazole] Rash and Other (See Comments)    ALL-OVER BODY RASH   Augmentin [Amoxicillin-Pot Clavulanate] Diarrhea and Nausea And Vomiting   Ciprofloxacin Diarrhea, Nausea Only and Other (See Comments)    Stomach ache   Coreg [Carvedilol] Other (See Comments)    Terrible cramping in the feet and had a lot of bowel movements, but not diarrhea   Diflucan [Fluconazole] Diarrhea   Losartan Swelling and Other (See Comments)    Patient doesn't recall site of swelling   Privigen [Immune Globulin (Human)] Other (See Comments)    Severe/excruciating pain   Sulfamethoxazole-Trimethoprim Diarrhea, Nausea Only and Other (See Comments)    Stomach upset   Verapamil Hives   Zetia [Ezetimibe] Other (See Comments)    Reaction not recalled   Zocor [Simvastatin - High Dose] Other (See Comments)    Reaction not recalled   Gatifloxacin Rash and Other (See Comments)    Redness to skin around eye    Patient Measurements: Height: 5' (152.4 cm) Weight: 63 kg (138 lb 14.2 oz) IBW/kg (Calculated) : 45.5 Heparin Dosing Weight:   Vital Signs: Temp: 98 F (36.7 C) (11/20 0501) Temp Source: Oral (11/20 0501) BP: 167/68 (11/20 0841) Pulse Rate: 84 (11/20 0841)  Labs: Recent Labs    08/26/22 2234 08/27/22 0537 08/27/22 0700 08/28/22 0757  HGB 9.6*  --  10.1*  --   HCT 29.6*  --  31.6*  --   PLT 163  --  201  --   LABPROT  --  23.6*  --  19.5*  INR  --  2.1*  --  1.7*  CREATININE 3.00*  --  2.78* 2.24*     Estimated Creatinine Clearance: 15.8 mL/min (A) (by C-G formula based on SCr of 2.24 mg/dL (H)).   Medical History: Past Medical History:  Diagnosis Date   Aortic atherosclerosis ([place]) 01/23/2018   Atrial fibrillation ([facility])    Benign positional vertigo 10/06/2011   Carotid artery disease ([place])    Chemotherapy induced  neutropenia ([place]) 07/21/2019   Cholelithiasis 01/23/2018   Cholelithiasis 01/23/2018   Chronic anticoagulation    Colovesical fistula, ruled out 04/14/2022   Coronary artery disease    status post coronary artery bypass grafting times 07/10/2004   Diabetes mellitus    Hypercholesteremia    Hypertension    Mechanical heart valve present    H. aortic valve replacement at the time of bypass surgery October 2005   Moderate to severe pulmonary hypertension ([place])    Peripheral arterial disease ([place])    history of left common iliac artery PTA and stenting for a chronic total occlusion 08/26/01   S/P cholecystectomy 03/06/2018    Medications:  Medications Prior to Admission  Medication Sig Dispense Refill Last Dose   acetaminophen (TYLENOL) 325 MG tablet Take 325 mg by mouth every 6 (six) hours as needed for mild pain or fever.   unknown   alendronate (FOSAMAX) 70 MG tablet Take 70 mg by mouth once a week. Saturdays   08/19/2022   allopurinol (ZYLOPRIM) 100 MG tablet Take 100 mg by mouth daily.   08/26/2022   ALPRAZolam (XANAX) 0.5 MG tablet Take 1 tablet (0.5 mg total) by mouth at bedtime. 5 tablet 0 08/25/2022   amLODipine (NORVASC) 10 MG tablet TAKE 1 TABLET EVERY DAY (Patient taking differently: Take 10 mg by mouth daily.) 90 tablet 3 08/26/2022  aspirin 81 MG chewable tablet 1 tablet Orally Once a day   08/26/2022   atorvastatin (LIPITOR) 80 MG tablet Take 1 tablet (80 mg total) by mouth daily at 6 PM. (Patient taking differently: Take 80 mg by mouth daily.)   08/25/2022   Cholecalciferol (VITAMIN D3) 50 MCG (2000 UT) capsule Take 2,000 Units by mouth daily with lunch.   08/25/2022   cloNIDine (CATAPRES) 0.2 MG tablet TAKE 1 TABLET TWICE DAILY 180 tablet 0 08/26/2022   Cyanocobalamin (VITAMIN B12) 1000 MCG TBCR Take 1,000 mcg by mouth daily with lunch.   Past Week   famotidine (PEPCID) 20 MG tablet Take 1 tablet (20 mg total) by mouth 2 (two) times daily. 60 tablet 3 17/00/1749   folic  acid (FOLVITE) 1 MG tablet Take 1 mg by mouth daily.   Past Week   furosemide (LASIX) 40 MG tablet TAKE 2 TABLETS IN MORNING AND 1 AND 1/2 TABLETS IN EVENING. CHANGE IN DOSAGE. (Patient taking differently: Take 40 mg by mouth See admin instructions. Take 2 tablets in the morning and 1 and 1/2 every evening) 315 tablet 0 08/26/2022   hydrALAZINE (APRESOLINE) 100 MG tablet TAKE 1 TABLET THREE TIMES DAILY (Patient taking differently: Take 100 mg by mouth 3 (three) times daily.) 270 tablet 3 08/26/2022   insulin aspart (NOVOLOG) 100 UNIT/ML injection Take 2 units for blood sugar 200-250, 4 units for blood sugar 252-300, 6 units for blood sugar 301-350, 8 units for blood sugar greater than 350 and call MD. (Patient taking differently: Inject 2-8 Units into the skin 3 (three) times daily with meals. Per sliding scale, If blood sugar is 200 to 250, give 2 units. If blood sugar is 251 to 300, give 4 units. If blood sugar is 301 to 350, give 6 units. If blood sugar is greater than 350, give 8 units and call MD.) 10 mL 11 08/26/2022   Insulin Degludec (TRESIBA) 100 UNIT/ML SOLN Inject 9-14 Units into the skin at bedtime.   Past Week   metoprolol succinate (TOPROL-XL) 25 MG 24 hr tablet TAKE 1/2 TABLET EVERY DAY 45 tablet 0 08/26/2022 at AM   ondansetron (ZOFRAN) 4 MG tablet Take 1 tablet (4 mg total) by mouth every 6 (six) hours as needed for nausea. 20 tablet 0 08/26/2022   ondansetron (ZOFRAN-ODT) 4 MG disintegrating tablet Take 4 mg by mouth every 8 (eight) hours as needed for nausea or vomiting.   UNK   pantoprazole (PROTONIX) 40 MG tablet Take 40 mg by mouth 2 (two) times daily.   08/26/2022   predniSONE (DELTASONE) 5 MG tablet Take 20 mg by mouth daily. Take 1 tablet daily   08/26/2022   saccharomyces boulardii (FLORASTOR) 250 MG capsule Take 250 mg by mouth 2 (two) times daily.   Past Week   simethicone (GAS-X) 80 MG chewable tablet Chew 1 tablet (80 mg total) by mouth 4 (four) times daily as needed for  flatulence. 100 tablet 2 unknown   sodium bicarbonate 650 MG tablet Take 650 mg by mouth 2 (two) times daily.   08/26/2022   spironolactone (ALDACTONE) 25 MG tablet TAKE 1 TABLET EVERY DAY (Patient taking differently: Take 25 mg by mouth daily.) 90 tablet 3 08/26/2022   tadalafil, PAH, (ADCIRCA) 20 MG tablet Take 2 tablets (40 mg total) by mouth daily. (Patient taking differently: Take 40 mg by mouth at bedtime.) 60 tablet 11 08/25/2022   vitamin C (ASCORBIC ACID) 500 MG tablet Take 500 mg by mouth daily with lunch.  Past Week   warfarin (COUMADIN) 5 MG tablet TAKE 1 TO 1 AND 1/2 TABLETS DAILY AS DIRECTED BY COUMADIN CLINIC (Patient taking differently: Take 5 mg by mouth See admin instructions. Take 1 tablet ever day except for on Sundays 1 and 1/2 tablets) 135 tablet 10 08/25/2022 at PM   cholestyramine (QUESTRAN) 4 GM/DOSE powder Take 4 g by mouth daily after supper. (Patient not taking: Reported on 08/17/2022)   Not Taking   doxycycline (VIBRA-TABS) 100 MG tablet Take 100 mg by mouth 2 (two) times daily.      HYDROmorphone HCl (DILAUDID) 1 MG/ML LIQD Take 1-2 mLs (1-2 mg total) by mouth every 6 (six) hours as needed for severe pain or moderate pain (use oral medication prior to giving IV). (Patient not taking: Reported on 08/17/2022) 20 mL 0 Completed Course   insulin glargine-yfgn (SEMGLEE) 100 UNIT/ML injection Inject 0.1 mLs (10 Units total) into the skin daily. (Patient taking differently: Inject 0.1 mLs into the skin daily.) 10 mL 11 UNK   Insulin Syringe-Needle U-100 (B-D INS SYR ULTRAFINE .3CC/29G) 29G X 1/2" 0.3 ML MISC Use for sliding scale for breakthrough high blood sugar readings 100 each 1    mirabegron ER (MYRBETRIQ) 25 MG TB24 tablet Take 1 tablet (25 mg total) by mouth daily. (Patient not taking: Reported on 08/17/2022) 10 tablet 0 Not Taking    Assessment: Pharmacy consulted to dose warfarin in patient with atrial fibrillation and mechanical aortic valve.  Patient's INR on admission  is 2.1 and home dose listed as 7.5 mg on Sunday and 5 mg ROW.  INR 2.1 >> 1.7  Goal of Therapy:  INR 2-3 Monitor platelets by anticoagulation protocol: Yes   Plan:  Warfarin 6 mg x 1 dose. Monitor daily INR and s/s of bleeding  Margot Ables, PharmD Clinical Pharmacist 08/28/2022 10:37 AM

## 2022-08-28 NOTE — Progress Notes (Signed)
PROGRESS NOTE     [name], is a 81 y.o. female, DOB - 10-Feb-1941, IPJ:[id number]  Admit date - 08/26/2022   Admitting Physician Bernadette Hoit, DO  Outpatient Primary MD for the patient is [name], [name], MD  LOS - 0  Cc---difficulty voiding     Brief Summary:- 81 y.o. female with medical history significant of  history of mechanical aortic valve on chronic Coumadin, ANCA positive vasculitis on prednisone, chronic A-fib, chronic diastolic heart failure, CKD stage IV (baseline creatinine 1.9-2.1), type 2 diabetes mellitus admitted with abd pain/constipation and concerns for AKI on CKD IV     A/p 1)Acute kidney injury superimposed on CKD stage IV On admission BUN/creatinine 50/3.0 (baseline creatinine at 1.9-2.1) -Creatinine trending down with hydration Renally adjust medications, avoid nephrotoxic agents/dehydration/hypotension -Voiding difficulties persist as below #2   2)Acute  urinary retention ----over 1 Liter of urine in bladder---voiding difficulties persist- requiring in and out  -start Flomax...Marland KitchenMarland KitchenMarland Kitchen may need to be discharged home with Foley catheter with outpatient follow-up with urology for urodynamic studies/voiding trial  3)Acute on chronic constipation--- Abdominal pain improving after BMs with laxatives --On admission CT abdomen and pelvis without acute findings Continue MiraLAX, Senokot   4)Chronic iron deficiency anemia--- suspect patient may also have some degree of anemia of CKD -Hgb currently stable close to baseline above 9 Continue ferrous sulfate   5)DM2-last A1c 6.3 reflecting excellent diabetic control PTA -Continue Semglee 10 units and adjust dose accordingly Use Novolog/Humalog Sliding scale insulin with Accu-Cheks/Fingersticks as ordered    6)ANCA Vasculitis Rash on legs erupted bilaterally after prednisone taper -Low index of suspicion for superimposed cellulitis -Currently off prednisone -Wound care consult for lower Extremity skin  concerns   7)Chronic atrial fibrillation Continue Toprol for rate control ----Stroke prophylaxis with Coumadin -Echo from 05/24/2021 with EF of 55 to 60% with indeterminate diagnostic parameters  8) history of Aortic repair with mechanical valve  -INR subtherapeutic -Pharmacy to manage Coumadin therapy -Echo from May 24, 2022 showed- St Jude mechanical aortic valve. Mean gradient 26 mmHg with DI 0.33, EOA 1.00 cm^2. This suggests a degree of prosthetic valve stenosis (prior  mean gradient 21 mmHg). No significant regurgitation.  -Outpatient follow-up with cardiology advised   9)GERD Continue Pepcid, Protonix   10)Essential hypertension Continue Toprol-XL, hydralazine, amlodipine   11)Gout Continue allopurinol  Disposition/Need for in-Hospital Stay- patient unable to be discharged at this time due to urinary difficulties with AKI on CKD requiring in and out catheterization* -Possible discharge home on 08/29/2022 if voiding difficulties improve especially if renal function continues to improve -She may need indwelling Foley catheter discharge with outpatient urology follow-up for voiding trial  Disposition: The patient is from: Home              Anticipated d/c is to: Home with [facility]              Anticipated d/c date is: 1 day              Patient currently is not medically stable to d/c. Barriers: Not Clinically Stable-   Code Status :  -  Code Status: DNR   Family Communication:     (patient is alert, awake and coherent)  Discussed with son at bedside  DVT Prophylaxis  :   - SCDs  Place and maintain sequential compression device Start: 08/28/22 1456 Place TED hose Start: 08/28/22 1456 SCDs Start: 08/27/22 0700   Lab Results  Component Value Date   PLT 201 08/27/2022  Inpatient Medications  Scheduled Meds:  allopurinol  100 mg Oral Daily   amLODipine  10 mg Oral Daily   atorvastatin  80 mg Oral q1800   famotidine  20 mg Oral QHS   feeding supplement (GLUCERNA  SHAKE)  237 mL Oral TID BM   ferrous sulfate  325 mg Oral Q breakfast   hydrALAZINE  100 mg Oral TID   insulin aspart  0-5 Units Subcutaneous QHS   insulin aspart  0-9 Units Subcutaneous TID WC   insulin glargine-yfgn  10 Units Subcutaneous QHS   metoprolol succinate  12.5 mg Oral Daily   pantoprazole  40 mg Oral BID   polyethylene glycol  17 g Oral BID   senna-docusate  2 tablet Oral QHS   tamsulosin  0.4 mg Oral QPC supper   Warfarin - Pharmacist Dosing Inpatient   Does not apply q1600   Continuous Infusions: PRN Meds:.acetaminophen **OR** acetaminophen, ALPRAZolam, ondansetron **OR** ondansetron (ZOFRAN) IV, oxyCODONE   Anti-infectives (From admission, onward)    None       Subjective: Lanah Alumbaugh today has no fevers, no emesis,  No chest pain,    Voiding difficulties persist- over 1 Liter of urine in bladder---voiding difficulties persist- requiring in and out  - Son Thurmond Butts at bedside--- questions answered   Objective: Vitals:   08/28/22 0501 08/28/22 0841 08/28/22 1534 08/28/22 1728  BP: (!) 155/53 (!) 167/68 (!) 153/52 (!) 168/61  Pulse: 93 84 97   Resp: 18  18   Temp: 98 F (36.7 C)  98.6 F (37 C)   TempSrc: Oral  Oral   SpO2: 95%  94%   Weight:      Height:        Intake/Output Summary (Last 24 hours) at 08/28/2022 1742 Last data filed at 08/28/2022 0618 Gross per 24 hour  Intake 45.61 ml  Output 600 ml  Net -554.39 ml   Filed Weights   08/26/22 2157  Weight: 63 kg   Physical Exam  Gen:- Awake Alert, no acute distress HEENT:- [place] II.AT, No sclera icterus Neck-Supple Neck,No JVD,.  Lungs-  CTAB , fair symmetrical air movement CV- S1, S2 normal, regular  Abd-  +ve B.Sounds, Abd Soft, No tenderness, less distended Extremity/Skin:- +ve edema, pedal pulses present  Psych-affect is appropriate, oriented x3 Neuro-generalized weakness no new focal deficits, no tremors  Data Reviewed: I have personally reviewed following labs and imaging  studies  CBC: Recent Labs  Lab 08/26/22 2234 08/27/22 0700  WBC 6.9 8.7  NEUTROABS 4.1  --   HGB 9.6* 10.1*  HCT 29.6* 31.6*  MCV 94.9 95.5  PLT 163 384   Basic Metabolic Panel: Recent Labs  Lab 08/26/22 2234 08/27/22 0700 08/28/22 0757  NA 133* 133* 135  K 4.1 4.2 4.4  CL 100 102 107  CO2 23 23 21*  GLUCOSE 177* 186* 159*  BUN 50* 49* 39*  CREATININE 3.00* 2.78* 2.24*  CALCIUM 8.7* 8.6* 8.6*  MG  --  2.4  --   PHOS  --  3.9 4.0   GFR: Estimated Creatinine Clearance: 15.8 mL/min (A) (by C-G formula based on SCr of 2.24 mg/dL (H)). Liver Function Tests: Recent Labs  Lab 08/26/22 2234 08/27/22 0700 08/28/22 0757  AST 30 30  --   ALT 15 15  --   ALKPHOS 106 112  --   BILITOT 0.8 0.7  --   PROT 5.1* 5.1*  --   ALBUMIN 2.8* 2.8* 2.8*   Radiology Studies: CT  ABDOMEN PELVIS WO CONTRAST  Result Date: 08/26/2022 CLINICAL DATA:  Bowel obstruction suspected EXAM: CT ABDOMEN AND PELVIS WITHOUT CONTRAST TECHNIQUE: Multidetector CT imaging of the abdomen and pelvis was performed following the standard protocol without IV contrast. RADIATION DOSE REDUCTION: This exam was performed according to the departmental dose-optimization program which includes automated exposure control, adjustment of the mA and/or kV according to patient size and/or use of iterative reconstruction technique. COMPARISON:  08/17/2022 FINDINGS: Lower Chest: Small pleural effusions with bibasilar atelectasis. Unchanged left basilar calcification Hepatobiliary: Normal hepatic contours. No intra- or extrahepatic biliary dilatation. Status post cholecystectomy. Pancreas: Normal pancreas. No ductal dilatation or peripancreatic fluid collection. Spleen: Numerous calcified granulomata. Adrenals/Urinary Tract: The adrenal glands are normal. No hydronephrosis, nephroureterolithiasis or solid renal mass. The urinary bladder is normal for degree of distention Stomach/Bowel: There is no hiatal hernia. Normal duodenal  course and caliber. No small bowel dilatation or inflammation. Rectosigmoid diverticulosis without acute inflammation. The appendix is not visualized. No right lower quadrant inflammation or free fluid. Vascular/Lymphatic: There is calcific atherosclerosis of the abdominal aorta. No lymphadenopathy. Reproductive: Status post hysterectomy. No adnexal mass. Other: None. Musculoskeletal: Chronic compression deformity of L4 IMPRESSION: 1. No acute abnormality of the abdomen or pelvis. 2. Small pleural effusions with bibasilar atelectasis. 3. Rectosigmoid diverticulosis without acute inflammation. Aortic Atherosclerosis (ICD10-I70.0). Electronically Signed   By: Ulyses Jarred M.D.   On: 08/26/2022 23:43   DG Chest Port 1 View  Result Date: 08/26/2022 CLINICAL DATA:  Cough EXAM: PORTABLE CHEST 1 VIEW COMPARISON:  Chest x-ray 08/04/2022 FINDINGS: Patient is status post cardiac surgery. The heart is enlarged. There central pulmonary vascular congestion. Calcified granulomas are again seen bilaterally. There is no pleural effusion or pneumothorax. No acute fractures are seen. IMPRESSION: Cardiomegaly with central pulmonary vascular congestion. Electronically Signed   By: Ronney Asters M.D.   On: 08/26/2022 23:35     Scheduled Meds:  allopurinol  100 mg Oral Daily   amLODipine  10 mg Oral Daily   atorvastatin  80 mg Oral q1800   famotidine  20 mg Oral QHS   feeding supplement (GLUCERNA SHAKE)  237 mL Oral TID BM   ferrous sulfate  325 mg Oral Q breakfast   hydrALAZINE  100 mg Oral TID   insulin aspart  0-5 Units Subcutaneous QHS   insulin aspart  0-9 Units Subcutaneous TID WC   insulin glargine-yfgn  10 Units Subcutaneous QHS   metoprolol succinate  12.5 mg Oral Daily   pantoprazole  40 mg Oral BID   polyethylene glycol  17 g Oral BID   senna-docusate  2 tablet Oral QHS   tamsulosin  0.4 mg Oral QPC supper   Warfarin - Pharmacist Dosing Inpatient   Does not apply q1600   Continuous Infusions:    LOS: 0 days    Roxan Hockey M.D on 08/28/2022 at 5:42 PM  Go to [email or web address] - for contact info  [facility] - Office  [phone]  If 7PM-7AM, please contact night-coverage [email or web address] 08/28/2022, 5:42 PM

## 2022-08-28 NOTE — Progress Notes (Addendum)
Noted patient had decreased urine output after changing canister and tubing. Bladder scan completed noted 999 in bladder. Patient reported no complaints. MD Courage made aware. New order placed. In an out cath completed noted 1050 in bladder. Md Courage aware.

## 2022-08-28 NOTE — Progress Notes (Addendum)
Patients family at bedside requested to speak with MD regarding patient. Per daughter patient is suppose to have dressings on bilateral lower legs. Dressing to left leg had already been removed. MD Courage made aware. Removed dressing to right leg per MD. New orders placed to apply TED hose and SCDs. Applied to bilateral lower legs.

## 2022-08-29 ENCOUNTER — Observation Stay (HOSPITAL_COMMUNITY): Payer: Medicare HMO

## 2022-08-29 ENCOUNTER — Other Ambulatory Visit: Payer: Self-pay | Admitting: *Deleted

## 2022-08-29 DIAGNOSIS — Z7189 Other specified counseling: Secondary | ICD-10-CM | POA: Diagnosis not present

## 2022-08-29 DIAGNOSIS — Z952 Presence of prosthetic heart valve: Secondary | ICD-10-CM | POA: Diagnosis not present

## 2022-08-29 DIAGNOSIS — M6259 Muscle wasting and atrophy, not elsewhere classified, multiple sites: Secondary | ICD-10-CM | POA: Diagnosis not present

## 2022-08-29 DIAGNOSIS — D464 Refractory anemia, unspecified: Secondary | ICD-10-CM | POA: Diagnosis present

## 2022-08-29 DIAGNOSIS — D649 Anemia, unspecified: Secondary | ICD-10-CM | POA: Diagnosis not present

## 2022-08-29 DIAGNOSIS — E869 Volume depletion, unspecified: Secondary | ICD-10-CM | POA: Diagnosis present

## 2022-08-29 DIAGNOSIS — E11319 Type 2 diabetes mellitus with unspecified diabetic retinopathy without macular edema: Secondary | ICD-10-CM | POA: Diagnosis present

## 2022-08-29 DIAGNOSIS — I5032 Chronic diastolic (congestive) heart failure: Secondary | ICD-10-CM | POA: Diagnosis not present

## 2022-08-29 DIAGNOSIS — K219 Gastro-esophageal reflux disease without esophagitis: Secondary | ICD-10-CM | POA: Diagnosis not present

## 2022-08-29 DIAGNOSIS — Z515 Encounter for palliative care: Secondary | ICD-10-CM | POA: Diagnosis not present

## 2022-08-29 DIAGNOSIS — I5033 Acute on chronic diastolic (congestive) heart failure: Secondary | ICD-10-CM | POA: Diagnosis present

## 2022-08-29 DIAGNOSIS — I509 Heart failure, unspecified: Secondary | ICD-10-CM

## 2022-08-29 DIAGNOSIS — R2681 Unsteadiness on feet: Secondary | ICD-10-CM | POA: Diagnosis not present

## 2022-08-29 DIAGNOSIS — I480 Paroxysmal atrial fibrillation: Secondary | ICD-10-CM | POA: Diagnosis not present

## 2022-08-29 DIAGNOSIS — Z741 Need for assistance with personal care: Secondary | ICD-10-CM | POA: Diagnosis not present

## 2022-08-29 DIAGNOSIS — J189 Pneumonia, unspecified organism: Secondary | ICD-10-CM | POA: Diagnosis not present

## 2022-08-29 DIAGNOSIS — E1165 Type 2 diabetes mellitus with hyperglycemia: Secondary | ICD-10-CM | POA: Diagnosis present

## 2022-08-29 DIAGNOSIS — E1151 Type 2 diabetes mellitus with diabetic peripheral angiopathy without gangrene: Secondary | ICD-10-CM | POA: Diagnosis present

## 2022-08-29 DIAGNOSIS — N189 Chronic kidney disease, unspecified: Secondary | ICD-10-CM | POA: Diagnosis not present

## 2022-08-29 DIAGNOSIS — I2721 Secondary pulmonary arterial hypertension: Secondary | ICD-10-CM | POA: Diagnosis not present

## 2022-08-29 DIAGNOSIS — E1122 Type 2 diabetes mellitus with diabetic chronic kidney disease: Secondary | ICD-10-CM | POA: Diagnosis present

## 2022-08-29 DIAGNOSIS — E440 Moderate protein-calorie malnutrition: Secondary | ICD-10-CM | POA: Diagnosis present

## 2022-08-29 DIAGNOSIS — R1312 Dysphagia, oropharyngeal phase: Secondary | ICD-10-CM | POA: Diagnosis not present

## 2022-08-29 DIAGNOSIS — N179 Acute kidney failure, unspecified: Secondary | ICD-10-CM | POA: Diagnosis not present

## 2022-08-29 DIAGNOSIS — E119 Type 2 diabetes mellitus without complications: Secondary | ICD-10-CM | POA: Diagnosis not present

## 2022-08-29 DIAGNOSIS — Z66 Do not resuscitate: Secondary | ICD-10-CM | POA: Diagnosis not present

## 2022-08-29 DIAGNOSIS — D591 Autoimmune hemolytic anemia, unspecified: Secondary | ICD-10-CM | POA: Diagnosis present

## 2022-08-29 DIAGNOSIS — J811 Chronic pulmonary edema: Secondary | ICD-10-CM | POA: Diagnosis not present

## 2022-08-29 DIAGNOSIS — D631 Anemia in chronic kidney disease: Secondary | ICD-10-CM | POA: Diagnosis present

## 2022-08-29 DIAGNOSIS — J9811 Atelectasis: Secondary | ICD-10-CM | POA: Diagnosis present

## 2022-08-29 DIAGNOSIS — I130 Hypertensive heart and chronic kidney disease with heart failure and stage 1 through stage 4 chronic kidney disease, or unspecified chronic kidney disease: Secondary | ICD-10-CM | POA: Diagnosis present

## 2022-08-29 DIAGNOSIS — I700 Atherosclerosis of aorta: Secondary | ICD-10-CM | POA: Diagnosis present

## 2022-08-29 DIAGNOSIS — N184 Chronic kidney disease, stage 4 (severe): Secondary | ICD-10-CM | POA: Diagnosis present

## 2022-08-29 DIAGNOSIS — I482 Chronic atrial fibrillation, unspecified: Secondary | ICD-10-CM | POA: Diagnosis present

## 2022-08-29 DIAGNOSIS — J90 Pleural effusion, not elsewhere classified: Secondary | ICD-10-CM | POA: Diagnosis not present

## 2022-08-29 DIAGNOSIS — R338 Other retention of urine: Secondary | ICD-10-CM | POA: Diagnosis not present

## 2022-08-29 DIAGNOSIS — I7782 Antineutrophilic cytoplasmic antibody (ANCA) vasculitis: Secondary | ICD-10-CM | POA: Diagnosis present

## 2022-08-29 DIAGNOSIS — R0602 Shortness of breath: Secondary | ICD-10-CM | POA: Diagnosis not present

## 2022-08-29 DIAGNOSIS — M6281 Muscle weakness (generalized): Secondary | ICD-10-CM | POA: Diagnosis not present

## 2022-08-29 DIAGNOSIS — Z794 Long term (current) use of insulin: Secondary | ICD-10-CM | POA: Diagnosis not present

## 2022-08-29 LAB — BASIC METABOLIC PANEL
Anion gap: 7 (ref 5–15)
BUN: 40 mg/dL — ABNORMAL HIGH (ref 8–23)
CO2: 21 mmol/L — ABNORMAL LOW (ref 22–32)
Calcium: 8.2 mg/dL — ABNORMAL LOW (ref 8.9–10.3)
Chloride: 107 mmol/L (ref 98–111)
Creatinine, Ser: 2.3 mg/dL — ABNORMAL HIGH (ref 0.44–1.00)
GFR, Estimated: 21 mL/min — ABNORMAL LOW (ref >60)
Glucose, Bld: 151 mg/dL — ABNORMAL HIGH (ref 70–99)
Potassium: 4.6 mmol/L (ref 3.5–5.1)
Sodium: 135 mmol/L (ref 135–145)

## 2022-08-29 LAB — CBC
HCT: 30.8 % — ABNORMAL LOW (ref 36.0–46.0)
Hemoglobin: 9.6 g/dL — ABNORMAL LOW (ref 12.0–15.0)
MCH: 30.4 pg (ref 26.0–34.0)
MCHC: 31.2 g/dL (ref 30.0–36.0)
MCV: 97.5 fL (ref 80.0–100.0)
Platelets: 171 10*3/uL (ref 150–400)
RBC: 3.16 MIL/uL — ABNORMAL LOW (ref 3.87–5.11)
RDW: 16.8 % — ABNORMAL HIGH (ref 11.5–15.5)
WBC: 9.2 10*3/uL (ref 4.0–10.5)
nRBC: 0 % (ref 0.0–0.2)

## 2022-08-29 LAB — GLUCOSE, CAPILLARY
Glucose-Capillary: 169 mg/dL — ABNORMAL HIGH (ref 70–99)
Glucose-Capillary: 155 mg/dL — ABNORMAL HIGH (ref 70–99)
Glucose-Capillary: 187 mg/dL — ABNORMAL HIGH (ref 70–99)
Glucose-Capillary: 197 mg/dL — ABNORMAL HIGH (ref 70–99)

## 2022-08-29 LAB — PROTIME-INR
INR: 2.2 — ABNORMAL HIGH (ref 0.8–1.2)
Prothrombin Time: 24.3 seconds — ABNORMAL HIGH (ref 11.4–15.2)

## 2022-08-29 MED ORDER — FUROSEMIDE 10 MG/ML IJ SOLN
40.0000 mg | Freq: Once | INTRAMUSCULAR | Status: AC
  Administered 2022-08-29: 40 mg via INTRAVENOUS
  Filled 2022-08-29: qty 4

## 2022-08-29 MED ORDER — FUROSEMIDE 10 MG/ML IJ SOLN
40.0000 mg | Freq: Two times a day (BID) | INTRAMUSCULAR | Status: DC
  Administered 2022-08-29: 40 mg via INTRAVENOUS
  Filled 2022-08-29 (×2): qty 4

## 2022-08-29 MED ORDER — WARFARIN SODIUM 5 MG PO TABS
5.0000 mg | ORAL_TABLET | Freq: Once | ORAL | Status: AC
  Administered 2022-08-29: 5 mg via ORAL
  Filled 2022-08-29: qty 1

## 2022-08-29 NOTE — Progress Notes (Signed)
PROGRESS NOTE     [name], is a 81 y.o. female, DOB - October 13, 1940, NFA:[id number]  Admit date - 08/26/2022   Admitting Physician Bernadette Hoit, DO  Outpatient Primary MD for the patient is [name], [name], MD  LOS - 0  Cc---difficulty voiding     Brief Summary:- 80 y.o. female with medical history significant of  history of mechanical aortic valve on chronic Coumadin, ANCA positive vasculitis on prednisone, chronic A-fib, chronic diastolic heart failure, CKD stage IV (baseline creatinine 1.9-2.1), type 2 diabetes mellitus admitted with abd pain/constipation and concerns for AKI on CKD IV 08/29/22--- patient with increasing shortness of breath fatigue and dyspnea on exertion, chest x-ray suggest CHF, BNP 498... Now requiring IV Lasix and close monitoring of renal function given underlying CKD 4     A/p 1)HFpEF--- acute on chronic diastolic dysfunction CHF--echo with EF of 55 to 60% --- patient with increasing shortness of breath fatigue and dyspnea on exertion, chest x-ray suggest CHF, BNP 498...  -O2 sats 90% on room air Start IV Lasix and close monitoring of renal function given underlying CKD 4 -PTA oral Lasix was held on admission due to concerns about AKI patient received IV fluids, appears to be somewhat volume overloaded -Daily weights fluid input and output monitoring  2)Acute kidney injury superimposed on CKD stage IV On admission BUN/creatinine 50/3.0 (baseline creatinine at 1.9-2.1) -Creatinine trending down with hydration (down to 2.30 from 3.0 on admission) Renally adjust medications, avoid nephrotoxic agents/dehydration/hypotension -Voiding difficulties persist as below #3 -Watch renal function closely given reinitiation of IV Lasix   3)Acute  urinary retention ----over 1 Liter of urine in bladder---voiding difficulties persist- requiring in and out  -c/n  Flomax...Marland KitchenMarland KitchenMarland Kitchen may need to be discharged home with Foley catheter with outpatient follow-up with urology  for urodynamic studies/voiding trial  4)Acute on chronic constipation--- Abdominal improved after BMs with laxatives --On admission CT abdomen and pelvis without acute findings Continue MiraLAX, Senokot   5)Chronic iron deficiency anemia--- patient has history of refractory anemia suspect patient may also have some degree of anemia of CKD -Hgb currently stable close to baseline above 9 Continue ferrous sulfate   6)DM2-last A1c 6.3 reflecting excellent diabetic control PTA -Continue Semglee 10 units and adjust dose accordingly Use Novolog/Humalog Sliding scale insulin with Accu-Cheks/Fingersticks as ordered    7)ANCA Vasculitis Rash on legs erupted bilaterally after prednisone taper -Low index of suspicion for superimposed cellulitis -Currently off prednisone -Wound care consult for lower Extremity skin concerns   8)Chronic atrial fibrillation Continue Toprol for rate control ----Stroke prophylaxis with Coumadin -Echo from 05/24/2021 with EF of 55 to 60% with indeterminate diagnostic parameters  9) history of Aortic repair with mechanical valve  -Pharmacy to manage Coumadin therapy -Echo from May 24, 2022 showed- St Jude mechanical aortic valve. Mean gradient 26 mmHg with DI 0.33, EOA 1.00 cm^2. This suggests a degree of prosthetic valve stenosis (prior  mean gradient 21 mmHg). No significant regurgitation.  -Outpatient follow-up with cardiology advised   10)GERD Continue Pepcid, Protonix   11)Essential hypertension Continue Toprol-XL, hydralazine, amlodipine   12)Gout Continue allopurinol  Disposition/Need for in-Hospital Stay- patient unable to be discharged at this time due to urinary difficulties with AKI on CKD requiring in and out catheterization* -Possible discharge home on 08/30/2022 if renal function remains stable and respiratory status improves -She will keep indwelling Foley catheter discharge with outpatient urology follow-up for voiding trial  Disposition:  The patient is from: Home  Anticipated d/c is to: Home with [facility]              Anticipated d/c date is: 1 day              Patient currently is not medically stable to d/c. Barriers: Not Clinically Stable-   Code Status :  -  Code Status: DNR   Family Communication:     (patient is alert, awake and coherent)  -Discussed with Waymon Budge  [phone]  DVT Prophylaxis  :   - SCDs  Place and maintain sequential compression device Start: 08/28/22 1456 Place TED hose Start: 08/28/22 1456 SCDs Start: 08/27/22 0700 warfarin (COUMADIN) tablet 5 mg   Lab Results  Component Value Date   PLT 171 08/29/2022   Inpatient Medications  Scheduled Meds:  allopurinol  100 mg Oral Daily   amLODipine  10 mg Oral Daily   atorvastatin  80 mg Oral q1800   famotidine  20 mg Oral QHS   feeding supplement (GLUCERNA SHAKE)  237 mL Oral TID BM   ferrous sulfate  325 mg Oral Q breakfast   furosemide  40 mg Intravenous Once   furosemide  40 mg Intravenous BID   hydrALAZINE  100 mg Oral TID   insulin aspart  0-5 Units Subcutaneous QHS   insulin aspart  0-9 Units Subcutaneous TID WC   insulin glargine-yfgn  10 Units Subcutaneous QHS   metoprolol succinate  12.5 mg Oral Daily   pantoprazole  40 mg Oral BID   polyethylene glycol  17 g Oral BID   senna-docusate  2 tablet Oral QHS   tamsulosin  0.4 mg Oral QPC supper   warfarin  5 mg Oral ONCE-1600   Warfarin - Pharmacist Dosing Inpatient   Does not apply q1600   Continuous Infusions: PRN Meds:.acetaminophen **OR** acetaminophen, ALPRAZolam, ondansetron **OR** ondansetron (ZOFRAN) IV, oxyCODONE   Anti-infectives (From admission, onward)    None       Subjective: [name] today has no fevers, no emesis,  No chest pain,    Voiding difficulties persist-after previous In-N-Out catheterization  -indwelling Foley placed -Complained of shortness of breath -O2 sats 90% on room air at rest -Chest x-ray suggestive of CHF Discussed with Waymon Budge  [phone]  Objective: Vitals:   08/28/22 1728 08/28/22 2028 08/29/22 0551 08/29/22 1236  BP: (!) 168/61 (!) 133/45 (!) 161/55 (!) 150/61  Pulse:  100 (!) 108 (!) 109  Resp:  (!) '22 16 20  '$ Temp:  98.1 F (36.7 C) (!) 97.5 F (36.4 C) 97.9 F (36.6 C)  TempSrc:  Oral Oral Oral  SpO2:  95% 94% 93%  Weight:      Height:        Intake/Output Summary (Last 24 hours) at 08/29/2022 1459 Last data filed at 08/29/2022 0900 Gross per 24 hour  Intake 120 ml  Output 475 ml  Net -355 ml   Filed Weights   08/26/22 2157  Weight: 63 kg   Physical Exam  Gen:- Awake Alert, no acute distress HEENT:- [place].AT, No sclera icterus Nose- [place] 2L/min Neck-Supple Neck, +ve JVD,.  Lungs-diminished breath sounds, no wheezing faint bibasilar Rales noted  CV- S1, S2 normal, regular  Abd-  +ve B.Sounds, Abd Soft, No tenderness,  Extremity/Skin:- +ve edema, pedal pulses present  Psych-affect is appropriate, oriented x3 Neuro-generalized weakness no new focal deficits, no tremors GU- foley placed  Data Reviewed: I have personally reviewed following labs and imaging studies  CBC: Recent Labs  Lab 08/26/22 2234 08/27/22 0700 08/29/22 0401  WBC 6.9 8.7 9.2  NEUTROABS 4.1  --   --   HGB 9.6* 10.1* 9.6*  HCT 29.6* 31.6* 30.8*  MCV 94.9 95.5 97.5  PLT 163 201 387   Basic Metabolic Panel: Recent Labs  Lab 08/26/22 2234 08/27/22 0700 08/28/22 0757 08/29/22 0401  NA 133* 133* 135 135  K 4.1 4.2 4.4 4.6  CL 100 102 107 107  CO2 23 23 21* 21*  GLUCOSE 177* 186* 159* 151*  BUN 50* 49* 39* 40*  CREATININE 3.00* 2.78* 2.24* 2.30*  CALCIUM 8.7* 8.6* 8.6* 8.2*  MG  --  2.4  --   --   PHOS  --  3.9 4.0  --    GFR: Estimated Creatinine Clearance: 15.4 mL/min (A) (by C-G formula based on SCr of 2.3 mg/dL (H)). Liver Function Tests: Recent Labs  Lab 08/26/22 2234 08/27/22 0700 08/28/22 0757  AST 30 30  --   ALT 15 15  --   ALKPHOS 106 112  --   BILITOT 0.8 0.7  --   PROT 5.1*  5.1*  --   ALBUMIN 2.8* 2.8* 2.8*   Radiology Studies: DG CHEST PORT 1 VIEW  Result Date: 08/29/2022 CLINICAL DATA:  Shortness of breath EXAM: PORTABLE CHEST 1 VIEW COMPARISON:  Previous studies including the examination of 08/26/2022 FINDINGS: Transverse diameter of heart is increased. Central pulmonary vessels are prominent. There is prominence of interstitial markings in parahilar regions and lower lung fields. Small bilateral pleural effusions are noted. There is no pneumothorax. Metallic sutures are seen in the sternum. There are calcified nodules in right mid and left lower lung fields. IMPRESSION: Cardiomegaly. There is prominence of interstitial markings in the parahilar regions and lower lung fields suggesting mild interstitial edema or interstitial pneumonia. Small bilateral pleural effusions. Electronically Signed   By: Elmer Picker M.D.   On: 08/29/2022 13:18     Scheduled Meds:  allopurinol  100 mg Oral Daily   amLODipine  10 mg Oral Daily   atorvastatin  80 mg Oral q1800   famotidine  20 mg Oral QHS   feeding supplement (GLUCERNA SHAKE)  237 mL Oral TID BM   ferrous sulfate  325 mg Oral Q breakfast   furosemide  40 mg Intravenous Once   furosemide  40 mg Intravenous BID   hydrALAZINE  100 mg Oral TID   insulin aspart  0-5 Units Subcutaneous QHS   insulin aspart  0-9 Units Subcutaneous TID WC   insulin glargine-yfgn  10 Units Subcutaneous QHS   metoprolol succinate  12.5 mg Oral Daily   pantoprazole  40 mg Oral BID   polyethylene glycol  17 g Oral BID   senna-docusate  2 tablet Oral QHS   tamsulosin  0.4 mg Oral QPC supper   warfarin  5 mg Oral ONCE-1600   Warfarin - Pharmacist Dosing Inpatient   Does not apply q1600   Continuous Infusions:   LOS: 0 days    Roxan Hockey M.D on 08/29/2022 at 2:59 PM  Go to [email or web address] - for contact info  [facility] - Office  [phone]  If 7PM-7AM, please contact night-coverage [email or web address] 08/29/2022,  2:59 PM

## 2022-08-29 NOTE — Progress Notes (Signed)
ANTICOAGULATION CONSULT NOTE -   Pharmacy Consult for warfarin Indication: atrial fibrillation/mechanical aortic valve  Allergies  Allergen Reactions   Flagyl [Metronidazole] Rash and Other (See Comments)    ALL-OVER BODY RASH   Augmentin [Amoxicillin-Pot Clavulanate] Diarrhea and Nausea And Vomiting   Ciprofloxacin Diarrhea, Nausea Only and Other (See Comments)    Stomach ache   Coreg [Carvedilol] Other (See Comments)    Terrible cramping in the feet and had a lot of bowel movements, but not diarrhea   Diflucan [Fluconazole] Diarrhea   Losartan Swelling and Other (See Comments)    Patient doesn't recall site of swelling   Privigen [Immune Globulin (Human)] Other (See Comments)    Severe/excruciating pain   Sulfamethoxazole-Trimethoprim Diarrhea, Nausea Only and Other (See Comments)    Stomach upset   Verapamil Hives   Zetia [Ezetimibe] Other (See Comments)    Reaction not recalled   Zocor [Simvastatin - High Dose] Other (See Comments)    Reaction not recalled   Gatifloxacin Rash and Other (See Comments)    Redness to skin around eye    Patient Measurements: Height: 5' (152.4 cm) Weight: 63 kg (138 lb 14.2 oz) IBW/kg (Calculated) : 45.5 Heparin Dosing Weight:   Vital Signs: Temp: 97.5 F (36.4 C) (11/21 0551) Temp Source: Oral (11/21 0551) BP: 161/55 (11/21 0551) Pulse Rate: 108 (11/21 0551)  Labs: Recent Labs    08/26/22 2234 08/27/22 0537 08/27/22 0700 08/28/22 0757 08/29/22 0401  HGB 9.6*  --  10.1*  --  9.6*  HCT 29.6*  --  31.6*  --  30.8*  PLT 163  --  201  --  171  LABPROT  --  23.6*  --  19.5* 24.3*  INR  --  2.1*  --  1.7* 2.2*  CREATININE 3.00*  --  2.78* 2.24* 2.30*     Estimated Creatinine Clearance: 15.4 mL/min (A) (by C-G formula based on SCr of 2.3 mg/dL (H)).   Medical History: Past Medical History:  Diagnosis Date   Aortic atherosclerosis ([place]) 01/23/2018   Atrial fibrillation ([facility])    Benign positional vertigo 10/06/2011   Carotid  artery disease ([place])    Chemotherapy induced neutropenia ([place]) 07/21/2019   Cholelithiasis 01/23/2018   Cholelithiasis 01/23/2018   Chronic anticoagulation    Colovesical fistula, ruled out 04/14/2022   Coronary artery disease    status post coronary artery bypass grafting times 07/10/2004   Diabetes mellitus    Hypercholesteremia    Hypertension    Mechanical heart valve present    H. aortic valve replacement at the time of bypass surgery October 2005   Moderate to severe pulmonary hypertension ([place])    Peripheral arterial disease ([place])    history of left common iliac artery PTA and stenting for a chronic total occlusion 08/26/01   S/P cholecystectomy 03/06/2018    Medications:  Medications Prior to Admission  Medication Sig Dispense Refill Last Dose   acetaminophen (TYLENOL) 325 MG tablet Take 325 mg by mouth every 6 (six) hours as needed for mild pain or fever.   unknown   alendronate (FOSAMAX) 70 MG tablet Take 70 mg by mouth once a week. Saturdays   08/19/2022   allopurinol (ZYLOPRIM) 100 MG tablet Take 100 mg by mouth daily.   08/26/2022   ALPRAZolam (XANAX) 0.5 MG tablet Take 1 tablet (0.5 mg total) by mouth at bedtime. 5 tablet 0 08/25/2022   amLODipine (NORVASC) 10 MG tablet TAKE 1 TABLET EVERY DAY (Patient taking differently: Take 10  mg by mouth daily.) 90 tablet 3 08/26/2022   aspirin 81 MG chewable tablet 1 tablet Orally Once a day   08/26/2022   atorvastatin (LIPITOR) 80 MG tablet Take 1 tablet (80 mg total) by mouth daily at 6 PM. (Patient taking differently: Take 80 mg by mouth daily.)   08/25/2022   Cholecalciferol (VITAMIN D3) 50 MCG (2000 UT) capsule Take 2,000 Units by mouth daily with lunch.   08/25/2022   cloNIDine (CATAPRES) 0.2 MG tablet TAKE 1 TABLET TWICE DAILY 180 tablet 0 08/26/2022   Cyanocobalamin (VITAMIN B12) 1000 MCG TBCR Take 1,000 mcg by mouth daily with lunch.   Past Week   famotidine (PEPCID) 20 MG tablet Take 1 tablet (20 mg total) by mouth 2 (two)  times daily. 60 tablet 3 09/32/3557   folic acid (FOLVITE) 1 MG tablet Take 1 mg by mouth daily.   Past Week   furosemide (LASIX) 40 MG tablet TAKE 2 TABLETS IN MORNING AND 1 AND 1/2 TABLETS IN EVENING. CHANGE IN DOSAGE. (Patient taking differently: Take 40 mg by mouth See admin instructions. Take 2 tablets in the morning and 1 and 1/2 every evening) 315 tablet 0 08/26/2022   hydrALAZINE (APRESOLINE) 100 MG tablet TAKE 1 TABLET THREE TIMES DAILY (Patient taking differently: Take 100 mg by mouth 3 (three) times daily.) 270 tablet 3 08/26/2022   insulin aspart (NOVOLOG) 100 UNIT/ML injection Take 2 units for blood sugar 200-250, 4 units for blood sugar 252-300, 6 units for blood sugar 301-350, 8 units for blood sugar greater than 350 and call MD. (Patient taking differently: Inject 2-8 Units into the skin 3 (three) times daily with meals. Per sliding scale, If blood sugar is 200 to 250, give 2 units. If blood sugar is 251 to 300, give 4 units. If blood sugar is 301 to 350, give 6 units. If blood sugar is greater than 350, give 8 units and call MD.) 10 mL 11 08/26/2022   Insulin Degludec (TRESIBA) 100 UNIT/ML SOLN Inject 9-14 Units into the skin at bedtime.   Past Week   metoprolol succinate (TOPROL-XL) 25 MG 24 hr tablet TAKE 1/2 TABLET EVERY DAY 45 tablet 0 08/26/2022 at AM   ondansetron (ZOFRAN) 4 MG tablet Take 1 tablet (4 mg total) by mouth every 6 (six) hours as needed for nausea. 20 tablet 0 08/26/2022   ondansetron (ZOFRAN-ODT) 4 MG disintegrating tablet Take 4 mg by mouth every 8 (eight) hours as needed for nausea or vomiting.   UNK   pantoprazole (PROTONIX) 40 MG tablet Take 40 mg by mouth 2 (two) times daily.   08/26/2022   predniSONE (DELTASONE) 5 MG tablet Take 20 mg by mouth daily. Take 1 tablet daily   08/26/2022   saccharomyces boulardii (FLORASTOR) 250 MG capsule Take 250 mg by mouth 2 (two) times daily.   Past Week   simethicone (GAS-X) 80 MG chewable tablet Chew 1 tablet (80 mg total)  by mouth 4 (four) times daily as needed for flatulence. 100 tablet 2 unknown   sodium bicarbonate 650 MG tablet Take 650 mg by mouth 2 (two) times daily.   08/26/2022   spironolactone (ALDACTONE) 25 MG tablet TAKE 1 TABLET EVERY DAY (Patient taking differently: Take 25 mg by mouth daily.) 90 tablet 3 08/26/2022   tadalafil, PAH, (ADCIRCA) 20 MG tablet Take 2 tablets (40 mg total) by mouth daily. (Patient taking differently: Take 40 mg by mouth at bedtime.) 60 tablet 11 08/25/2022   vitamin C (ASCORBIC ACID) 500  MG tablet Take 500 mg by mouth daily with lunch.   Past Week   warfarin (COUMADIN) 5 MG tablet TAKE 1 TO 1 AND 1/2 TABLETS DAILY AS DIRECTED BY COUMADIN CLINIC (Patient taking differently: Take 5 mg by mouth See admin instructions. Take 1 tablet ever day except for on Sundays 1 and 1/2 tablets) 135 tablet 10 08/25/2022 at PM   cholestyramine (QUESTRAN) 4 GM/DOSE powder Take 4 g by mouth daily after supper. (Patient not taking: Reported on 08/17/2022)   Not Taking   doxycycline (VIBRA-TABS) 100 MG tablet Take 100 mg by mouth 2 (two) times daily.      HYDROmorphone HCl (DILAUDID) 1 MG/ML LIQD Take 1-2 mLs (1-2 mg total) by mouth every 6 (six) hours as needed for severe pain or moderate pain (use oral medication prior to giving IV). (Patient not taking: Reported on 08/17/2022) 20 mL 0 Completed Course   insulin glargine-yfgn (SEMGLEE) 100 UNIT/ML injection Inject 0.1 mLs (10 Units total) into the skin daily. (Patient taking differently: Inject 0.1 mLs into the skin daily.) 10 mL 11 UNK   Insulin Syringe-Needle U-100 (B-D INS SYR ULTRAFINE .3CC/29G) 29G X 1/2" 0.3 ML MISC Use for sliding scale for breakthrough high blood sugar readings 100 each 1    mirabegron ER (MYRBETRIQ) 25 MG TB24 tablet Take 1 tablet (25 mg total) by mouth daily. (Patient not taking: Reported on 08/17/2022) 10 tablet 0 Not Taking    Assessment: Pharmacy consulted to dose warfarin in patient with atrial fibrillation and  mechanical aortic valve.  Patient's INR on admission is 2.1 and home dose listed as 7.5 mg on Sunday and 5 mg ROW.  INR 2.1 >> 1.7> 2.2  Goal of Therapy:  INR 2-3 Monitor platelets by anticoagulation protocol: Yes   Plan:  Warfarin 5 mg x 1 dose. Monitor daily INR and s/s of bleeding  Margot Ables, PharmD Clinical Pharmacist 08/29/2022 10:27 AM

## 2022-08-29 NOTE — Evaluation (Signed)
Physical Therapy Evaluation Patient Details Name: [name] MRN: [id number] DOB: [birth year] Today's Date: 08/29/2022  History of Present Illness  [name] is a 81 y.o. female with medical history significant of  history of mechanical aortic valve on chronic Coumadin, ANCA positive vasculitis on prednisone, chronic A-fib, chronic diastolic heart failure, CKD stage IV (baseline creatinine 1.9-2.1), type 2 diabetes mellitus who presents to the emergency department via EMS from home due to 1 week onset of constipation with complaint of abdominal discomfort with nausea which started yesterday.  She has history of ANCA vasculitis and was on prednisone since last March, she was recently taking off the prednisone with subsequent reddening of the chronic lower leg pain.  Per daughter at bedside, the red rash on both legs (right leg was wrapped in Ace bandage) was currently being treated as cellulitis with doxycycline.  Patient does not drink much fluid per daughter at bedside.   Clinical Impression  Patient presents on 2 LPM O2, put on room air (baseline) with SpO2 at 96%. Patient demonstrates slightly labored movement for sitting up at bedside. Patient able to stand and transfer to chair with min guard/RW and walk ~15 ft in room before having to sit due to fatigue and c/o shortness of breath. Patient SpO2 averaging 95% during activity and increased to 96% while resting following ambulation, pt left on room air. Patient tolerated sitting up in chair after therapy. Patient will benefit from continued skilled physical therapy in hospital and recommended venue below to increase strength, balance, endurance for safe ADLs and gait.     Recommendations for follow up therapy are one component of a multi-disciplinary discharge planning process, led by the attending physician.  Recommendations may be updated based on patient status, additional functional criteria and insurance authorization.  Follow Up  Recommendations Home health PT      Assistance Recommended at Discharge Set up Supervision/Assistance  Patient can return home with the following  A little help with walking and/or transfers;A little help with bathing/dressing/bathroom;Assistance with cooking/housework;Help with stairs or ramp for entrance    Equipment Recommendations None recommended by PT  Recommendations for Other Services       Functional Status Assessment Patient has had a recent decline in their functional status and demonstrates the ability to make significant improvements in function in a reasonable and predictable amount of time.     Precautions / Restrictions Precautions Precautions: Fall Restrictions Weight Bearing Restrictions: No      Mobility  Bed Mobility Overal bed mobility: Needs Assistance Bed Mobility: Supine to Sit     Supine to sit: Supervision     General bed mobility comments: labored movement, increased time, able to sit up without assist with verbal cueing to push through elbows    Transfers Overall transfer level: Needs assistance Equipment used: Rolling walker (2 wheels) Transfers: Sit to/from Stand, Bed to chair/wheelchair/BSC Sit to Stand: Min guard   Step pivot transfers: Min guard       General transfer comment: patient able to stand with min guard/RW and transfer to chair demonstrating slightly labored movement, limited mostly due to fatigue and c/o shortness of breath    Ambulation/Gait Ambulation/Gait assistance: Min guard, Supervision Gait Distance (Feet): 15 Feet Assistive device: Rolling walker (2 wheels) Gait Pattern/deviations: Decreased step length - left, Decreased step length - right, Decreased stride length Gait velocity: slow     General Gait Details: patient able to ambulate in room with min guard/RW, limited in distance due to  fatigue. Patient presents on 2 LPM O2, put on room air with SpO2 at 96%, averaging 95% during activity and increased to 96%  while resting following ambulation, pt left on room air  Stairs            Wheelchair Mobility    Modified Rankin (Stroke Patients Only)       Balance Overall balance assessment: Needs assistance Sitting-balance support: No upper extremity supported, Feet supported Sitting balance-Leahy Scale: Fair Sitting balance - Comments: fair/good seated EOB     Standing balance-Leahy Scale: Fair Standing balance comment: with RW                             Pertinent Vitals/Pain Pain Assessment Pain Assessment: No/denies pain    Home Living Family/patient expects to be discharged to:: Private residence Living Arrangements: Children Available Help at Discharge: Family;Available 24 hours/day Type of Home: House Home Access: Stairs to enter Entrance Stairs-Rails: Psychiatric nurse of Steps: 3   Home Layout: One level Home Equipment: Conservation officer, nature (2 wheels);Cane - single point;Shower seat;Crutches;Grab bars - toilet;Wheelchair - manual Additional Comments: Patient reports her daughter is now living with her and able to assist 24/7    Prior Function Prior Level of Function : Independent/Modified Independent             Mobility Comments: Household ambulator with rollator and RW ADLs Comments: Independent with ADLs, family assist with iADLs     Hand Dominance   Dominant Hand: Right    Extremity/Trunk Assessment   Upper Extremity Assessment Upper Extremity Assessment: Generalized weakness    Lower Extremity Assessment Lower Extremity Assessment: Generalized weakness    Cervical / Trunk Assessment Cervical / Trunk Assessment: Kyphotic  Communication   Communication: No difficulties  Cognition Arousal/Alertness: Awake/alert Behavior During Therapy: WFL for tasks assessed/performed Overall Cognitive Status: Within Functional Limits for tasks assessed                                          General Comments       Exercises     Assessment/Plan    PT Assessment Patient needs continued PT services  PT Problem List Decreased strength;Decreased activity tolerance;Decreased balance;Decreased mobility       PT Treatment Interventions Balance training;DME instruction;Gait training;Stair training;Functional mobility training;Patient/family education;Therapeutic exercise;Therapeutic activities    PT Goals (Current goals can be found in the Care Plan section)  Acute Rehab PT Goals Patient Stated Goal: return home with family to assist PT Goal Formulation: With patient Time For Goal Achievement: 08/29/22 Potential to Achieve Goals: Good    Frequency Min 3X/week     Co-evaluation               AM-PAC PT "6 Clicks" Mobility  Outcome Measure Help needed turning from your back to your side while in a flat bed without using bedrails?: None Help needed moving from lying on your back to sitting on the side of a flat bed without using bedrails?: A Little Help needed moving to and from a bed to a chair (including a wheelchair)?: A Little Help needed standing up from a chair using your arms (e.g., wheelchair or bedside chair)?: A Little Help needed to walk in hospital room?: A Little Help needed climbing 3-5 steps with a railing? : A Lot 6 Click Score: 18  End of Session   Activity Tolerance: Patient tolerated treatment well;Patient limited by fatigue Patient left: in chair;with call bell/phone within reach Nurse Communication: Mobility status PT Visit Diagnosis: Unsteadiness on feet (R26.81);Other abnormalities of gait and mobility (R26.89);Muscle weakness (generalized) (M62.81)    Time: 4758-3074 PT Time Calculation (min) (ACUTE ONLY): 29 min   Charges:   PT Evaluation $PT Eval Moderate Complexity: 1 Mod PT Treatments $Therapeutic Activity: 23-37 mins        Zigmund Gottron, SPT

## 2022-08-29 NOTE — Progress Notes (Signed)
Pt's purewick malfunctioned, small amount of urine on pad. Bladdar scan=450cc, I&O=475cc. Pt resting comfortably, will continue to monitor.

## 2022-08-29 NOTE — Plan of Care (Signed)
  Problem: Acute Rehab PT Goals(only PT should resolve) Goal: Pt Will Go Supine/Side To Sit Outcome: Progressing Flowsheets (Taken 08/29/2022 1522) Pt will go Supine/Side to Sit: with modified independence Goal: Patient Will Transfer Sit To/From Stand Outcome: Progressing Flowsheets (Taken 08/29/2022 1522) Patient will transfer sit to/from stand: with modified independence Goal: Pt Will Transfer Bed To Chair/Chair To Bed Outcome: Progressing Flowsheets (Taken 08/29/2022 1522) Pt will Transfer Bed to Chair/Chair to Bed: with supervision Goal: Pt Will Ambulate Outcome: Progressing Flowsheets (Taken 08/29/2022 1522) Pt will Ambulate:  with supervision  with rolling walker  50 feet   Zigmund Gottron, SPT

## 2022-08-29 NOTE — Progress Notes (Signed)
Patient refused flomax states that it makes her " itch" notified Dr. Joesph Fillers .,

## 2022-08-29 NOTE — Consult Note (Signed)
WOC Nurse Consult Note: Consult requested for bilat legs.  Performed remotely after review of progress notes and photos in the EMR.  Pt has generalized edema and no open wounds or drainage.  Topical treatment orders provided for bedside nurses to apply compression stockings to reduce edema.  Please re-consult if further assistance is needed.  Thank-you,  Julien Girt MSN, [place], [place], [place], [place]

## 2022-08-29 NOTE — Progress Notes (Signed)
Came in to look at patient per RN request.  Listened to BS, clear and diminished in bases, no crackles, wheezing, rhonchi auscultated at this time.  Rate at 20 when counted.  Patient states that she is breathing better, patient, however, is a mouth breather and appears to be breathing heavy.  I did raise flow to 3L which brought sat up to 91%.  Will continue to monitor.

## 2022-08-30 ENCOUNTER — Inpatient Hospital Stay (HOSPITAL_COMMUNITY): Payer: Medicare HMO

## 2022-08-30 ENCOUNTER — Inpatient Hospital Stay: Payer: Medicare HMO

## 2022-08-30 ENCOUNTER — Encounter: Payer: Self-pay | Admitting: Hematology and Oncology

## 2022-08-30 ENCOUNTER — Inpatient Hospital Stay: Payer: Medicare HMO | Admitting: Hematology and Oncology

## 2022-08-30 DIAGNOSIS — R338 Other retention of urine: Secondary | ICD-10-CM | POA: Diagnosis not present

## 2022-08-30 DIAGNOSIS — I5033 Acute on chronic diastolic (congestive) heart failure: Secondary | ICD-10-CM

## 2022-08-30 DIAGNOSIS — N189 Chronic kidney disease, unspecified: Secondary | ICD-10-CM | POA: Diagnosis not present

## 2022-08-30 DIAGNOSIS — N179 Acute kidney failure, unspecified: Secondary | ICD-10-CM | POA: Diagnosis not present

## 2022-08-30 LAB — BASIC METABOLIC PANEL
Anion gap: 7 (ref 5–15)
BUN: 42 mg/dL — ABNORMAL HIGH (ref 8–23)
CO2: 22 mmol/L (ref 22–32)
Calcium: 8 mg/dL — ABNORMAL LOW (ref 8.9–10.3)
Chloride: 106 mmol/L (ref 98–111)
Creatinine, Ser: 2.51 mg/dL — ABNORMAL HIGH (ref 0.44–1.00)
GFR, Estimated: 19 mL/min — ABNORMAL LOW (ref >60)
Glucose, Bld: 184 mg/dL — ABNORMAL HIGH (ref 70–99)
Potassium: 4.8 mmol/L (ref 3.5–5.1)
Sodium: 135 mmol/L (ref 135–145)

## 2022-08-30 LAB — CBC
HCT: 31.3 % — ABNORMAL LOW (ref 36.0–46.0)
Hemoglobin: 9.7 g/dL — ABNORMAL LOW (ref 12.0–15.0)
MCH: 30 pg (ref 26.0–34.0)
MCHC: 31 g/dL (ref 30.0–36.0)
MCV: 96.9 fL (ref 80.0–100.0)
Platelets: 204 10*3/uL (ref 150–400)
RBC: 3.23 MIL/uL — ABNORMAL LOW (ref 3.87–5.11)
RDW: 16.6 % — ABNORMAL HIGH (ref 11.5–15.5)
WBC: 9.2 10*3/uL (ref 4.0–10.5)
nRBC: 0 % (ref 0.0–0.2)

## 2022-08-30 LAB — GLUCOSE, CAPILLARY
Glucose-Capillary: 149 mg/dL — ABNORMAL HIGH (ref 70–99)
Glucose-Capillary: 119 mg/dL — ABNORMAL HIGH (ref 70–99)
Glucose-Capillary: 187 mg/dL — ABNORMAL HIGH (ref 70–99)
Glucose-Capillary: 88 mg/dL (ref 70–99)

## 2022-08-30 LAB — PROTIME-INR
INR: 3.2 — ABNORMAL HIGH (ref 0.8–1.2)
Prothrombin Time: 32.6 seconds — ABNORMAL HIGH (ref 11.4–15.2)

## 2022-08-30 MED ORDER — FUROSEMIDE 40 MG PO TABS
60.0000 mg | ORAL_TABLET | Freq: Every evening | ORAL | Status: DC
  Administered 2022-08-30 – 2022-09-04 (×6): 60 mg via ORAL
  Filled 2022-08-30 (×6): qty 2

## 2022-08-30 MED ORDER — LEVALBUTEROL HCL 0.63 MG/3ML IN NEBU
0.6300 mg | INHALATION_SOLUTION | Freq: Four times a day (QID) | RESPIRATORY_TRACT | Status: DC | PRN
  Administered 2022-08-30 – 2022-09-02 (×5): 0.63 mg via RESPIRATORY_TRACT
  Filled 2022-08-30 (×5): qty 3

## 2022-08-30 MED ORDER — FUROSEMIDE 40 MG PO TABS
80.0000 mg | ORAL_TABLET | Freq: Every day | ORAL | Status: DC
  Administered 2022-08-30 – 2022-09-05 (×7): 80 mg via ORAL
  Filled 2022-08-30 (×8): qty 2

## 2022-08-30 MED ORDER — METOPROLOL SUCCINATE ER 25 MG PO TB24
25.0000 mg | ORAL_TABLET | Freq: Every day | ORAL | Status: DC
  Administered 2022-08-30 – 2022-09-05 (×7): 25 mg via ORAL
  Filled 2022-08-30 (×7): qty 1

## 2022-08-30 MED ORDER — METOPROLOL SUCCINATE ER 25 MG PO TB24: 25.0000 mg | ORAL_TABLET | Freq: Every day | ORAL | Status: DC

## 2022-08-30 NOTE — Progress Notes (Signed)
Came in to give patient breathing treatment.  Flowmeter was on 1.5L, patient sat was 89%.  Took patient back up to 3L, patient stated she was having trouble clearing secretions; "it is like it is stuck".  Current sat on 3L is 95%.

## 2022-08-30 NOTE — Progress Notes (Signed)
PROGRESS NOTE     [name], is a 81 y.o. female, DOB - [birth year], XKG:[id number]  Admit date - 08/26/2022   Admitting Physician Courage Denton Brick, MD  Outpatient Primary MD for the patient is [name], [name], MD  LOS - 1  Cc---difficulty voiding     Brief Summary:- 81 y.o. female with medical history significant of  history of mechanical aortic valve on chronic Coumadin, ANCA positive vasculitis on prednisone, chronic A-fib, chronic diastolic heart failure, CKD stage IV (baseline creatinine 1.9-2.1), type 2 diabetes mellitus admitted with abd pain/constipation and concerns for AKI on CKD IV 08/29/22--- patient with increasing shortness of breath fatigue and dyspnea on exertion, chest x-ray suggest CHF, BNP 498... Now requiring IV Lasix and close monitoring of renal function given underlying CKD 4     A/p 1)HFpEF--- acute on chronic diastolic dysfunction CHF--echo with EF of 55 to 60% --- patient with increasing shortness of breath fatigue and dyspnea on exertion, chest x-ray suggest CHF, BNP 498.  -O2 sats 90% on room air Continue IV Lasix and close monitoring of renal function given underlying CKD 4 -CXR with persistent pulmonary edema; continue IV lasix today; reassess in AM   Intake/Output Summary (Last 24 hours) at 08/30/2022 1446 Last data filed at 08/30/2022 1436 Gross per 24 hour  Intake 240 ml  Output 2400 ml  Net -2160 ml   Filed Weights   08/26/22 2157 08/30/22 0425  Weight: 63 kg 64.5 kg    2)Acute kidney injury superimposed on CKD stage IV On admission BUN/creatinine 50/3.0 (baseline creatinine at 1.9-2.1) -Creatinine trending down with hydration (down to 2.30 from 3.0 on admission) Renally adjust medications, avoid nephrotoxic agents/dehydration/hypotension -Voiding difficulties persist as below #3 -monitor renal function on IV Lasix   3)Acute urinary retention ----over 1 Liter of urine in bladder---voiding difficulties persist- requiring in and out   -c/n  Flomax...Marland KitchenMarland KitchenMarland Kitchen plan to be discharged home with Foley catheter with outpatient follow-up with urology for urodynamic studies/voiding trial  4)Acute on chronic constipation--- Abdominal improved after BMs with laxatives --On admission CT abdomen and pelvis without acute findings Continue MiraLAX, Senokot   5)Chronic iron deficiency anemia--- patient has history of refractory anemia suspect patient may also have some degree of anemia of CKD -Hgb currently stable close to baseline above 9 Continue ferrous sulfate   6)DM2-last A1c 6.3 reflecting excellent diabetic control PTA -Continue Semglee 10 units and adjust dose accordingly Use Novolog/Humalog Sliding scale insulin with Accu-Cheks/Fingersticks as ordered   CBG (last 3)  Recent Labs    08/29/22 2111 08/30/22 0812 08/30/22 1153  GLUCAP 197* 149* 119*    7)ANCA Vasculitis Rash on legs erupted bilaterally after prednisone taper -Low index of suspicion for superimposed cellulitis -Currently off prednisone -Wound care consult for lower Extremity skin concerns   8)Chronic atrial fibrillation Continue Toprol for rate control ----Stroke prophylaxis with Coumadin -Echo from 05/24/2021 with EF of 55 to 60% with indeterminate diagnostic parameters  9) history of Aortic repair with mechanical valve  -Pharmacy to manage Coumadin therapy -Echo from May 24, 2022 showed- St Jude mechanical aortic valve. Mean gradient 26 mmHg with DI 0.33, EOA 1.00 cm^2. This suggests a degree of prosthetic valve stenosis (prior  mean gradient 21 mmHg). No significant regurgitation.  -Outpatient follow-up with cardiology advised   10)GERD Continue Pepcid, Protonix   11)Essential hypertension Continue Toprol-XL, hydralazine, amlodipine   12)Gout Continue allopurinol  Disposition/Need for in-Hospital Stay- patient unable to be discharged at this time due to urinary difficulties with  AKI on CKD requiring in and out catheterization* -Possible  discharge home on 08/30/2022 if renal function remains stable and respiratory status improves -She will keep indwelling Foley catheter discharge with outpatient urology follow-up for voiding trial  Disposition: The patient is from: Home              Anticipated d/c is to: Home with [facility]              Anticipated d/c date is: 1 day              Patient currently is not medically stable to d/c. Barriers: Not Clinically Stable-   Code Status :  -  Code Status: DNR   Family Communication:     (patient is alert, awake and coherent)  -Discussed with Waymon Budge  [phone]  DVT Prophylaxis  :   - SCDs  Place and maintain sequential compression device Start: 08/28/22 1456 Place TED hose Start: 08/28/22 1456 SCDs Start: 08/27/22 0700   Lab Results  Component Value Date   PLT 204 08/30/2022   Inpatient Medications  Scheduled Meds:  allopurinol  100 mg Oral Daily   amLODipine  10 mg Oral Daily   atorvastatin  80 mg Oral q1800   famotidine  20 mg Oral QHS   feeding supplement (GLUCERNA SHAKE)  237 mL Oral TID BM   ferrous sulfate  325 mg Oral Q breakfast   furosemide  60 mg Oral QPM   furosemide  80 mg Oral Q breakfast   hydrALAZINE  100 mg Oral TID   insulin aspart  0-5 Units Subcutaneous QHS   insulin aspart  0-9 Units Subcutaneous TID WC   insulin glargine-yfgn  10 Units Subcutaneous QHS   metoprolol succinate  25 mg Oral Daily   pantoprazole  40 mg Oral BID   polyethylene glycol  17 g Oral BID   senna-docusate  2 tablet Oral QHS   Warfarin - Pharmacist Dosing Inpatient   Does not apply q1600   Continuous Infusions: PRN Meds:.acetaminophen **OR** acetaminophen, ALPRAZolam, levalbuterol, ondansetron **OR** ondansetron (ZOFRAN) IV, oxyCODONE   Anti-infectives (From admission, onward)    None       Subjective: Anquanette Lemanski reports overall tired of being in bed; breathing better overall.    Objective: Vitals:   08/30/22 0506 08/30/22 0616 08/30/22 0804 08/30/22 1426  BP:   (!) 169/57 (!) 162/52 (!) 161/58  Pulse:  (!) 118 (!) 103 (!) 101  Resp:  (!) 23 (!) 21 20  Temp:  98.1 F (36.7 C) 98.2 F (36.8 C) 98.7 F (37.1 C)  TempSrc:  Oral Oral   SpO2: 95% 96% 97% 94%  Weight:      Height:        Intake/Output Summary (Last 24 hours) at 08/30/2022 1443 Last data filed at 08/30/2022 1436 Gross per 24 hour  Intake 240 ml  Output 2400 ml  Net -2160 ml   Filed Weights   08/26/22 2157 08/30/22 0425  Weight: 63 kg 64.5 kg   Physical Exam  Gen:- frail, elderly female, Awake Alert, no acute distress HEENT:- [place].AT, No sclera icterus Nose-  2L/min Neck-Supple Neck, +ve JVD,.  Lungs-crackles heard.   CV- normal S1, S2 sounds.   Abd-  +ve B.Sounds, Abd Soft, No tenderness,  Extremity/Skin:- trace pretibial edema, pedal pulses present  Psych-affect is flat; oriented x3 Neuro-generalized weakness no new focal deficits, no tremors GU- foley placed, amber urine seen.   Data Reviewed: I have personally reviewed following  labs and imaging studies  CBC: Recent Labs  Lab 08/26/22 2234 08/27/22 0700 08/29/22 0401 08/30/22 0335  WBC 6.9 8.7 9.2 9.2  NEUTROABS 4.1  --   --   --   HGB 9.6* 10.1* 9.6* 9.7*  HCT 29.6* 31.6* 30.8* 31.3*  MCV 94.9 95.5 97.5 96.9  PLT 163 201 171 967   Basic Metabolic Panel: Recent Labs  Lab 08/26/22 2234 08/27/22 0700 08/28/22 0757 08/29/22 0401 08/30/22 0335  NA 133* 133* 135 135 135  K 4.1 4.2 4.4 4.6 4.8  CL 100 102 107 107 106  CO2 23 23 21* 21* 22  GLUCOSE 177* 186* 159* 151* 184*  BUN 50* 49* 39* 40* 42*  CREATININE 3.00* 2.78* 2.24* 2.30* 2.51*  CALCIUM 8.7* 8.6* 8.6* 8.2* 8.0*  MG  --  2.4  --   --   --   PHOS  --  3.9 4.0  --   --    GFR: Estimated Creatinine Clearance: 14.2 mL/min (A) (by C-G formula based on SCr of 2.51 mg/dL (H)). Liver Function Tests: Recent Labs  Lab 08/26/22 2234 08/27/22 0700 08/28/22 0757  AST 30 30  --   ALT 15 15  --   ALKPHOS 106 112  --   BILITOT 0.8 0.7  --    PROT 5.1* 5.1*  --   ALBUMIN 2.8* 2.8* 2.8*   Radiology Studies: DG CHEST PORT 1 VIEW  Result Date: 08/30/2022 CLINICAL DATA:  Pulmonary edema. EXAM: PORTABLE CHEST 1 VIEW COMPARISON:  August 29, 2022. FINDINGS: Mild cardiomegaly is noted. Status post coronary bypass graft. Mild central pulmonary vascular congestion and bilateral pulmonary edema is noted with small bilateral pleural effusions. Bony thorax is unremarkable. IMPRESSION: Mild cardiomegaly with central pulmonary vascular congestion and bilateral pulmonary edema. Small bilateral pleural effusions are noted. Aortic Atherosclerosis (ICD10-I70.0). Electronically Signed   By: Marijo Conception M.D.   On: 08/30/2022 09:23   DG CHEST PORT 1 VIEW  Result Date: 08/29/2022 CLINICAL DATA:  Shortness of breath EXAM: PORTABLE CHEST 1 VIEW COMPARISON:  Previous studies including the examination of 08/26/2022 FINDINGS: Transverse diameter of heart is increased. Central pulmonary vessels are prominent. There is prominence of interstitial markings in parahilar regions and lower lung fields. Small bilateral pleural effusions are noted. There is no pneumothorax. Metallic sutures are seen in the sternum. There are calcified nodules in right mid and left lower lung fields. IMPRESSION: Cardiomegaly. There is prominence of interstitial markings in the parahilar regions and lower lung fields suggesting mild interstitial edema or interstitial pneumonia. Small bilateral pleural effusions. Electronically Signed   By: Elmer Picker M.D.   On: 08/29/2022 13:18     Scheduled Meds:  allopurinol  100 mg Oral Daily   amLODipine  10 mg Oral Daily   atorvastatin  80 mg Oral q1800   famotidine  20 mg Oral QHS   feeding supplement (GLUCERNA SHAKE)  237 mL Oral TID BM   ferrous sulfate  325 mg Oral Q breakfast   furosemide  60 mg Oral QPM   furosemide  80 mg Oral Q breakfast   hydrALAZINE  100 mg Oral TID   insulin aspart  0-5 Units Subcutaneous QHS    insulin aspart  0-9 Units Subcutaneous TID WC   insulin glargine-yfgn  10 Units Subcutaneous QHS   metoprolol succinate  25 mg Oral Daily   pantoprazole  40 mg Oral BID   polyethylene glycol  17 g Oral BID   senna-docusate  2  tablet Oral QHS   Warfarin - Pharmacist Dosing Inpatient   Does not apply q1600   Continuous Infusions:   LOS: 1 day    Irwin Brakeman M.D on 08/30/2022 at 2:43 PM  Go to [email or web address] - for contact info  [facility] - Office  [phone]  If 7PM-7AM, please contact night-coverage [email or web address] 08/30/2022, 2:43 PM

## 2022-08-30 NOTE — Progress Notes (Signed)
   08/30/22 0425  Vitals  Temp 98.6 F (37 C)  BP (!) 159/62  MAP (mmHg) 91  BP Location Right Arm  BP Method Automatic  Patient Position (if appropriate) Lying  Pulse Rate (!) 109  Pulse Rate Source Monitor  Resp (!) 26  MEWS COLOR  MEWS Score Color Yellow  Oxygen Therapy  SpO2 97 %  O2 Device Nasal Cannula  MEWS Score  MEWS Temp 0  MEWS Systolic 0  MEWS Pulse 1  MEWS RR 2  MEWS LOC 0  MEWS Score 3

## 2022-08-30 NOTE — Progress Notes (Signed)
   08/30/22 0616  Vitals  Temp 98.1 F (36.7 C)  Temp Source Oral  BP (!) 169/57  MAP (mmHg) 87  BP Location Right Arm  BP Method Automatic  Patient Position (if appropriate) Lying  Pulse Rate (!) 118  Pulse Rate Source Monitor  Resp (!) 23  Level of Consciousness  Level of Consciousness Alert  MEWS COLOR  MEWS Score Color Yellow  Oxygen Therapy  SpO2 96 %  O2 Device Nasal Cannula  O2 Flow Rate (L/min) 3 L/min  MEWS Score  MEWS Temp 0  MEWS Systolic 0  MEWS Pulse 2  MEWS RR 1  MEWS LOC 0  MEWS Score 3

## 2022-08-30 NOTE — Telephone Encounter (Signed)
This RN spoke with pt's son [name] per appt today for visit and injection-with pt currently admitted to [facility].  Appts will be canceled and rescheduled to next week. Scheduling request sent.  No further needs at this time.

## 2022-08-30 NOTE — TOC Initial Note (Signed)
Transition of Care [facility]) - Initial/Assessment Note    Patient Details  Name: [name] MRN: [id number] Date of Birth: 12/18/1940  Transition of Care [facility]) CM/SW Contact:    [name], [place] Phone Number: 08/30/2022, 11:44 AM  Clinical Narrative:  Pt admitted for acute kidney injury. Assessment completed due to high risk readmission score. Pt sleeping at time of visit. Pt's daughter, [name] completed assessment with LCSW. [name] reports pt lives with her and family is with pt almost around the clock. She ambulates with a walker at baseline. Pt is active with [facility] HHPT/OT. PT recommends resuming home health services. [name] is agreeable. TOC will continue to follow.                  Expected Discharge Plan: [place] Barriers to Discharge: Continued Medical Work up   Patient Goals and CMS Choice Patient states their goals for this hospitalization and ongoing recovery are:: return home   Choice offered to / list presented to : Adult Children  Expected Discharge Plan and Services Expected Discharge Plan: [place] In-house Referral: Clinical Social Work   Post Acute Care Choice: Resumption of Svcs/PTA Provider Living arrangements for the past 2 months: Single Family Home Expected Discharge Date: 08/29/22                         HH Arranged: PT, OT HH Agency: [facility] Date [place]: 08/30/22 Time [place]: 27 Representative spoke with at [place]: Marjory Lies  Prior Living Arrangements/Services Living arrangements for the past 2 months: [place] Lives with:: Adult Children Patient language and need for interpreter reviewed:: Yes Do you feel safe going back to the place where you live?: Yes      Need for Family Participation in Patient Care: Yes (Comment) Care giver support system in place?: Yes (comment) Current home services: DME, Home PT, Home OT (wheelchair, walker,  3N1) Criminal Activity/Legal Involvement Pertinent to Current Situation/Hospitalization: No - Comment as needed  Activities of Daily Living Home Assistive Devices/Equipment: None ADL Screening (condition at time of admission) Patient's cognitive ability adequate to safely complete daily activities?: Yes Is the patient deaf or have difficulty hearing?: No Does the patient have difficulty seeing, even when wearing glasses/contacts?: No Does the patient have difficulty concentrating, remembering, or making decisions?: No Patient able to express need for assistance with ADLs?: Yes Does the patient have difficulty dressing or bathing?: No Independently performs ADLs?: Yes (appropriate for developmental age) Does the patient have difficulty walking or climbing stairs?: No Weakness of Legs: Both Weakness of Arms/Hands: None  Permission Sought/Granted                  Emotional Assessment         Alcohol / Substance Use: Not Applicable Psych Involvement: No (comment)  Admission diagnosis:  AKI (acute kidney injury) ([place]) [N17.9] Acute kidney injury ([place]) [N17.9] Constipation, unspecified constipation type [K59.00] Nausea and vomiting, unspecified vomiting type [R11.2] Acute exacerbation of CHF (congestive heart failure) ([place]) [I50.9] Patient Active Problem List   Diagnosis Date Noted   Acute on chronic heart failure with preserved ejection fraction (HFpEF) ([place]) 08/29/2022   Acute exacerbation of CHF (congestive heart failure) ([place]) 08/29/2022   Acute urinary retention 08/28/2022   Acute kidney injury superimposed on chronic kidney disease ([place]) 08/27/2022   Constipation 08/27/2022   Nausea 08/27/2022   Hypoalbuminemia due to  protein-calorie malnutrition ([place]) 08/27/2022   Type 2 diabetes mellitus with hyperglycemia ([place]) 08/27/2022   Palliative care encounter    Goals of care, counseling/discussion    Counseling and coordination of care    GI bleed 07/09/2022   GI  bleeding 07/08/2022   Acute on chronic anemia 07/08/2022   History of Clostridioides difficile colitis 07/08/2022   DNR (do not resuscitate)/DNI(Do Not Intubate) 07/08/2022   Anemia of chronic disease 07/07/2022   Pressure injury of skin 06/23/2022   UTI (urinary tract infection) 06/22/2022   Joint pain 06/09/2022   Parietoalveolar pneumopathy ([place]) 06/09/2022   Primary osteoarthritis 06/09/2022   Rheumatoid factor positive 06/09/2022   C. difficile colitis 04/19/2022   Colitis 04/18/2022   PAH (pulmonary artery hypertension) ([place]) 04/18/2022   Emphysematous cystitis 04/13/2022   Female bladder prolapse 04/13/2022   Diverticulitis 04/12/2022   Autoimmune hemolytic anemia ([place]) 01/02/2022   Bilateral lower extremity edema 01/02/2022   Refractory anemia (HCC) 12/15/2021   Abnormal chest x-ray 12/15/2021   Supratherapeutic INR 12/15/2021   Elevated troponin 12/15/2021   Unilateral primary osteoarthritis, right hip 05/31/2021   Midline cystocele 05/30/2021   Primary localized osteoarthritis of pelvic region and thigh 05/30/2021   Vaginal pain 05/30/2021   Asymmetric SNHL (sensorineural hearing loss) 01/06/2021   Referred otalgia of both ears 01/06/2021   Bilateral hearing loss 08/31/2020   Low back pain 02/20/2020   CKD (chronic kidney disease), stage IV ([place]) 10/13/2019   Leukopenia due to antineoplastic chemotherapy ([place]) 08/18/2019   Thrombophilia ([place]) 06/19/2019   Encounter for general adult medical examination without abnormal findings 06/13/2019   Arteritis ([place]) 06/06/2019   Hypertensive heart and chronic kidney disease with heart failure and stage 1 through stage 4 chronic kidney disease, or unspecified chronic kidney disease ([place]) 06/06/2019   Muscle weakness 06/06/2019   Pancytopenia ([place]) 05/28/2019   Dyspnea 05/28/2019   Vasculitis, ANCA positive 05/28/2019   Disorder of connective tissue ([place]) 05/14/2019   Renal insufficiency 05/08/2019   PAF (paroxysmal atrial  fibrillation) ([place]) 04/07/2019   Iron deficiency anemia 02/28/2019   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 ([place]) 02/28/2019   Diabetic retinopathy associated with type 2 diabetes mellitus ([place]) 09/02/2018   Chronic pain 06/19/2018   Non-thrombocytopenic purpura ([place]) 05/21/2018   Gout 03/22/2018   Malnutrition of moderate degree 03/12/2018   Diabetes mellitus type 2, controlled ([place]) 03/06/2018   Chronic atrial fibrillation 03/06/2018   H/O mechanical aortic valve replacement 03/06/2018   Moderate to severe pulmonary hypertension ([place]) 03/06/2018   GERD without esophagitis 02/21/2018   Anxiety 02/21/2018   Chronic diastolic CHF (congestive heart failure) ([place]) 02/21/2018   General weakness 02/02/2018   Thrombocytopenia ([place]) 01/25/2018   Arm pain, diffuse 01/25/2018   Anemia 01/23/2018   Aortic atherosclerosis ([place]) 01/23/2018   Age-related osteoporosis without current pathological fracture 05/24/2017   Carotid artery occlusion 05/24/2017   Generalized anxiety disorder 05/24/2017   Hearing loss 05/24/2017   Presence of prosthetic heart valve 05/24/2017   Carotid artery disease ([place]) 08/18/2013   Peripheral arterial disease ([place]) 08/18/2013   Diabetes mellitus without complication ([place]) 13/05/6577   Essential hypertension    Hypercholesteremia    Mechanical heart valve present    Chronic anticoagulation    PCP:  [name], [name], MD Pharmacy:   [facility] [place], [place] [address] Phone: [phone] Fax: [phone]  [facility] - [place], [place] - [place] [address]  [address] [place] [address] Phone: [phone] Fax: [phone]     Social Determinants of Health (SDOH) Interventions    Readmission Risk Interventions    08/30/2022   11:42 AM 04/20/2022   12:02 PM  Readmission Risk Prevention Plan  Transportation  Screening Complete Complete  Medication Review ([place]) Complete Complete  PCP or Specialist appointment within 3-5 days of discharge  Complete  HRI or [place] Complete Complete  SW Recovery Care/Counseling Consult Complete Complete  Palliative Care Screening Not Applicable Not [place] Not Applicable Not Applicable

## 2022-08-30 NOTE — Progress Notes (Signed)
ANTICOAGULATION CONSULT NOTE -   Pharmacy Consult for warfarin Indication: atrial fibrillation/mechanical aortic valve  Allergies  Allergen Reactions   Flagyl [Metronidazole] Rash and Other (See Comments)    ALL-OVER BODY RASH   Augmentin [Amoxicillin-Pot Clavulanate] Diarrhea and Nausea And Vomiting   Ciprofloxacin Diarrhea, Nausea Only and Other (See Comments)    Stomach ache   Coreg [Carvedilol] Other (See Comments)    Terrible cramping in the feet and had a lot of bowel movements, but not diarrhea   Diflucan [Fluconazole] Diarrhea   Losartan Swelling and Other (See Comments)    Patient doesn't recall site of swelling   Privigen [Immune Globulin (Human)] Other (See Comments)    Severe/excruciating pain   Sulfamethoxazole-Trimethoprim Diarrhea, Nausea Only and Other (See Comments)    Stomach upset   Verapamil Hives   Zetia [Ezetimibe] Other (See Comments)    Reaction not recalled   Zocor [Simvastatin - High Dose] Other (See Comments)    Reaction not recalled   Gatifloxacin Rash and Other (See Comments)    Redness to skin around eye    Patient Measurements: Height: 5' (152.4 cm) Weight: 64.5 kg (142 lb 3.2 oz) IBW/kg (Calculated) : 45.5  Vital Signs: Temp: 98.2 F (36.8 C) (11/22 0804) Temp Source: Oral (11/22 0804) BP: 162/52 (11/22 0804) Pulse Rate: 103 (11/22 0804)  Labs: Recent Labs    08/28/22 0757 08/29/22 0401 08/30/22 0335  HGB  --  9.6* 9.7*  HCT  --  30.8* 31.3*  PLT  --  171 204  LABPROT 19.5* 24.3* 32.6*  INR 1.7* 2.2* 3.2*  CREATININE 2.24* 2.30* 2.51*     Estimated Creatinine Clearance: 14.2 mL/min (A) (by C-G formula based on SCr of 2.51 mg/dL (H)).   Medical History: Past Medical History:  Diagnosis Date   Aortic atherosclerosis ([place]) 01/23/2018   Atrial fibrillation (HCC)    Benign positional vertigo 10/06/2011   Carotid artery disease ([place])    Chemotherapy induced neutropenia ([place]) 07/21/2019   Cholelithiasis 01/23/2018    Cholelithiasis 01/23/2018   Chronic anticoagulation    Colovesical fistula, ruled out 04/14/2022   Coronary artery disease    status post coronary artery bypass grafting times 07/10/2004   Diabetes mellitus    Hypercholesteremia    Hypertension    Mechanical heart valve present    H. aortic valve replacement at the time of bypass surgery October 2005   Moderate to severe pulmonary hypertension ([place])    Peripheral arterial disease ([place])    history of left common iliac artery PTA and stenting for a chronic total occlusion 08/26/01   S/P cholecystectomy 03/06/2018    Medications:  Medications Prior to Admission  Medication Sig Dispense Refill Last Dose   acetaminophen (TYLENOL) 325 MG tablet Take 325 mg by mouth every 6 (six) hours as needed for mild pain or fever.   unknown   alendronate (FOSAMAX) 70 MG tablet Take 70 mg by mouth once a week. Saturdays   08/19/2022   allopurinol (ZYLOPRIM) 100 MG tablet Take 100 mg by mouth daily.   08/26/2022   ALPRAZolam (XANAX) 0.5 MG tablet Take 1 tablet (0.5 mg total) by mouth at bedtime. 5 tablet 0 08/25/2022   amLODipine (NORVASC) 10 MG tablet TAKE 1 TABLET EVERY DAY (Patient taking differently: Take 10 mg by mouth daily.) 90 tablet 3 08/26/2022   aspirin 81 MG chewable tablet 1 tablet Orally Once a day   08/26/2022   atorvastatin (LIPITOR) 80 MG tablet Take 1 tablet (80 mg  total) by mouth daily at 6 PM. (Patient taking differently: Take 80 mg by mouth daily.)   08/25/2022   Cholecalciferol (VITAMIN D3) 50 MCG (2000 UT) capsule Take 2,000 Units by mouth daily with lunch.   08/25/2022   cloNIDine (CATAPRES) 0.2 MG tablet TAKE 1 TABLET TWICE DAILY 180 tablet 0 08/26/2022   Cyanocobalamin (VITAMIN B12) 1000 MCG TBCR Take 1,000 mcg by mouth daily with lunch.   Past Week   famotidine (PEPCID) 20 MG tablet Take 1 tablet (20 mg total) by mouth 2 (two) times daily. 60 tablet 3 42/35/3614   folic acid (FOLVITE) 1 MG tablet Take 1 mg by mouth daily.   Past Week    furosemide (LASIX) 40 MG tablet TAKE 2 TABLETS IN MORNING AND 1 AND 1/2 TABLETS IN EVENING. CHANGE IN DOSAGE. (Patient taking differently: Take 40 mg by mouth See admin instructions. Take 2 tablets in the morning and 1 and 1/2 every evening) 315 tablet 0 08/26/2022   hydrALAZINE (APRESOLINE) 100 MG tablet TAKE 1 TABLET THREE TIMES DAILY (Patient taking differently: Take 100 mg by mouth 3 (three) times daily.) 270 tablet 3 08/26/2022   insulin aspart (NOVOLOG) 100 UNIT/ML injection Take 2 units for blood sugar 200-250, 4 units for blood sugar 252-300, 6 units for blood sugar 301-350, 8 units for blood sugar greater than 350 and call MD. (Patient taking differently: Inject 2-8 Units into the skin 3 (three) times daily with meals. Per sliding scale, If blood sugar is 200 to 250, give 2 units. If blood sugar is 251 to 300, give 4 units. If blood sugar is 301 to 350, give 6 units. If blood sugar is greater than 350, give 8 units and call MD.) 10 mL 11 08/26/2022   Insulin Degludec (TRESIBA) 100 UNIT/ML SOLN Inject 9-14 Units into the skin at bedtime.   Past Week   metoprolol succinate (TOPROL-XL) 25 MG 24 hr tablet TAKE 1/2 TABLET EVERY DAY 45 tablet 0 08/26/2022 at AM   ondansetron (ZOFRAN) 4 MG tablet Take 1 tablet (4 mg total) by mouth every 6 (six) hours as needed for nausea. 20 tablet 0 08/26/2022   ondansetron (ZOFRAN-ODT) 4 MG disintegrating tablet Take 4 mg by mouth every 8 (eight) hours as needed for nausea or vomiting.   UNK   pantoprazole (PROTONIX) 40 MG tablet Take 40 mg by mouth 2 (two) times daily.   08/26/2022   predniSONE (DELTASONE) 5 MG tablet Take 20 mg by mouth daily. Take 1 tablet daily   08/26/2022   saccharomyces boulardii (FLORASTOR) 250 MG capsule Take 250 mg by mouth 2 (two) times daily.   Past Week   simethicone (GAS-X) 80 MG chewable tablet Chew 1 tablet (80 mg total) by mouth 4 (four) times daily as needed for flatulence. 100 tablet 2 unknown   sodium bicarbonate 650 MG  tablet Take 650 mg by mouth 2 (two) times daily.   08/26/2022   spironolactone (ALDACTONE) 25 MG tablet TAKE 1 TABLET EVERY DAY (Patient taking differently: Take 25 mg by mouth daily.) 90 tablet 3 08/26/2022   tadalafil, PAH, (ADCIRCA) 20 MG tablet Take 2 tablets (40 mg total) by mouth daily. (Patient taking differently: Take 40 mg by mouth at bedtime.) 60 tablet 11 08/25/2022   vitamin C (ASCORBIC ACID) 500 MG tablet Take 500 mg by mouth daily with lunch.   Past Week   warfarin (COUMADIN) 5 MG tablet TAKE 1 TO 1 AND 1/2 TABLETS DAILY AS DIRECTED BY COUMADIN CLINIC (Patient taking  differently: Take 5 mg by mouth See admin instructions. Take 1 tablet ever day except for on Sundays 1 and 1/2 tablets) 135 tablet 10 08/25/2022 at PM   cholestyramine (QUESTRAN) 4 GM/DOSE powder Take 4 g by mouth daily after supper. (Patient not taking: Reported on 08/17/2022)   Not Taking   doxycycline (VIBRA-TABS) 100 MG tablet Take 100 mg by mouth 2 (two) times daily.      HYDROmorphone HCl (DILAUDID) 1 MG/ML LIQD Take 1-2 mLs (1-2 mg total) by mouth every 6 (six) hours as needed for severe pain or moderate pain (use oral medication prior to giving IV). (Patient not taking: Reported on 08/17/2022) 20 mL 0 Completed Course   insulin glargine-yfgn (SEMGLEE) 100 UNIT/ML injection Inject 0.1 mLs (10 Units total) into the skin daily. (Patient taking differently: Inject 0.1 mLs into the skin daily.) 10 mL 11 UNK   Insulin Syringe-Needle U-100 (B-D INS SYR ULTRAFINE .3CC/29G) 29G X 1/2" 0.3 ML MISC Use for sliding scale for breakthrough high blood sugar readings 100 each 1    mirabegron ER (MYRBETRIQ) 25 MG TB24 tablet Take 1 tablet (25 mg total) by mouth daily. (Patient not taking: Reported on 08/17/2022) 10 tablet 0 Not Taking    Assessment: Pharmacy consulted to dose warfarin in patient with atrial fibrillation and mechanical aortic valve.  Patient's INR on admission is 2.1 and home dose listed as 7.5 mg on Sunday and 5 mg  ROW.  INR 2.1 >> 1.7> 2.2> 3.2, supratherapeutic  Goal of Therapy:  INR 2-3 Monitor platelets by anticoagulation protocol: Yes   Plan:  No Coumadin today. Monitor daily INR and s/s of bleeding  Isac Sarna, BS Pharm D, BCPS Clinical Pharmacist 08/30/2022 8:22 AM

## 2022-08-30 NOTE — Care Management Important Message (Signed)
Important Message  Patient Details  Name: LAHARI SUTTLES MRN: [id number] Date of Birth: 1941/05/24   Medicare Important Message Given:  Yes  Spoke with daughter Charlies Silvers at [phone] to explain letter.  A copy will be placed in room for family to pick up when visiting.  [facility] number given.   Tommy Medal 08/30/2022, 1:30 PM

## 2022-08-31 ENCOUNTER — Inpatient Hospital Stay (HOSPITAL_COMMUNITY): Payer: Medicare HMO

## 2022-08-31 DIAGNOSIS — N189 Chronic kidney disease, unspecified: Secondary | ICD-10-CM | POA: Diagnosis not present

## 2022-08-31 DIAGNOSIS — N179 Acute kidney failure, unspecified: Secondary | ICD-10-CM | POA: Diagnosis not present

## 2022-08-31 DIAGNOSIS — I5033 Acute on chronic diastolic (congestive) heart failure: Secondary | ICD-10-CM | POA: Diagnosis not present

## 2022-08-31 DIAGNOSIS — R338 Other retention of urine: Secondary | ICD-10-CM | POA: Diagnosis not present

## 2022-08-31 LAB — PROTIME-INR
INR: 2.9 — ABNORMAL HIGH (ref 0.8–1.2)
Prothrombin Time: 30.3 seconds — ABNORMAL HIGH (ref 11.4–15.2)

## 2022-08-31 LAB — GLUCOSE, CAPILLARY
Glucose-Capillary: 167 mg/dL — ABNORMAL HIGH (ref 70–99)
Glucose-Capillary: 197 mg/dL — ABNORMAL HIGH (ref 70–99)
Glucose-Capillary: 167 mg/dL — ABNORMAL HIGH (ref 70–99)
Glucose-Capillary: 149 mg/dL — ABNORMAL HIGH (ref 70–99)

## 2022-08-31 MED ORDER — CHLORHEXIDINE GLUCONATE CLOTH 2 % EX PADS
6.0000 | MEDICATED_PAD | Freq: Every day | CUTANEOUS | Status: DC
  Administered 2022-08-31 – 2022-09-05 (×6): 6 via TOPICAL

## 2022-08-31 MED ORDER — WARFARIN SODIUM 2 MG PO TABS
4.0000 mg | ORAL_TABLET | Freq: Once | ORAL | Status: AC
  Administered 2022-08-31: 4 mg via ORAL
  Filled 2022-08-31: qty 2

## 2022-08-31 MED ORDER — DOXYCYCLINE HYCLATE 100 MG PO TABS
100.0000 mg | ORAL_TABLET | Freq: Two times a day (BID) | ORAL | Status: AC
  Administered 2022-08-31 – 2022-09-02 (×6): 100 mg via ORAL
  Filled 2022-08-31 (×6): qty 1

## 2022-08-31 NOTE — Progress Notes (Signed)
PROGRESS NOTE     [name], is a 81 y.o. female, DOB - 07-Jun-1941, ZOX:[id number]  Admit date - 08/26/2022   Admitting Physician [name] Denton Brick, MD  Outpatient Primary MD for the patient is [name], [name], MD  LOS - 2  Cc---difficulty voiding     Brief Summary:- 81 y.o. female with medical history significant of  history of mechanical aortic valve on chronic Coumadin, ANCA positive vasculitis on prednisone, chronic A-fib, chronic diastolic heart failure, CKD stage IV (baseline creatinine 1.9-2.1), type 2 diabetes mellitus admitted with abd pain/constipation and concerns for AKI on CKD IV 08/29/22--- patient with increasing shortness of breath fatigue and dyspnea on exertion, chest x-ray suggest CHF, BNP 498... Now requiring IV Lasix and close monitoring of renal function given underlying CKD 4     A/p 1)HFpEF--- acute on chronic diastolic dysfunction CHF--echo with EF of 55 to 60% --- patient with increasing shortness of breath fatigue and dyspnea on exertion, chest x-ray suggest CHF, BNP 498.  -O2 sats 90% on room air Treated initially with IV Lasix and close monitoring of renal function given underlying CKD 4 -CXR with some improvement; transitioned back to home oral lasix dosing   Intake/Output Summary (Last 24 hours) at 08/31/2022 1416 Last data filed at 08/31/2022 1256 Gross per 24 hour  Intake 360 ml  Output 2252 ml  Net -1892 ml   Filed Weights   08/26/22 2157 08/30/22 0425 08/31/22 0531  Weight: 63 kg 64.5 kg 63.2 kg    2)Acute kidney injury superimposed on CKD stage IV On admission BUN/creatinine 50/3.0 (baseline creatinine at 1.9-2.1) -Creatinine trending down with hydration (down to 2.30 from 3.0 on admission) Renally adjust medications, avoid nephrotoxic agents/dehydration/hypotension -Voiding difficulties persist as below #3 -monitor renal function on Lasix   3)Acute urinary retention ----over 1 Liter of urine in bladder---voiding difficulties  persist- requiring in and out  -c/n  Flomax...Marland KitchenMarland KitchenMarland Kitchen plan to be discharged home with Foley catheter with outpatient follow-up with urology for urodynamic studies/voiding trial  4)Acute on chronic constipation--- Abdominal improved after BMs with laxatives --On admission CT abdomen and pelvis without acute findings Continue MiraLAX, Senokot   5)Chronic iron deficiency anemia--- patient has history of refractory anemia suspect patient may also have some degree of anemia of CKD -Hgb currently stable close to baseline above 9 Continue ferrous sulfate   6)controlled type 2 DM - last A1c 6.3 reflecting excellent diabetic control PTA -Continue Semglee 10 units and adjust dose accordingly Use Novolog/Humalog Sliding scale insulin with Accu-Cheks/Fingersticks as ordered   CBG (last 3)  Recent Labs    08/30/22 2037 08/31/22 0725 08/31/22 1254  GLUCAP 88 167* 197*    7)ANCA Vasculitis Rash on legs erupted bilaterally after prednisone taper -Low index of suspicion for superimposed cellulitis -Currently off prednisone -Wound care consult for lower Extremity skin concerns   8)Chronic atrial fibrillation Continue Toprol for rate control ----Stroke prophylaxis with Coumadin -Echo from 05/24/2021 with EF of 55 to 60% with indeterminate diagnostic parameters  9) history of Aortic repair with mechanical valve  -Pharmacy to manage Coumadin therapy -Echo from May 24, 2022 showed- St Jude mechanical aortic valve. Mean gradient 26 mmHg with DI 0.33, EOA 1.00 cm^2. This suggests a degree of prosthetic valve stenosis (prior  mean gradient 21 mmHg). No significant regurgitation.  -Outpatient follow-up with cardiology advised   10)GERD Continue Pepcid, Protonix   11)Essential hypertension Continue Toprol-XL, hydralazine, amlodipine   12)Gout Continue allopurinol  Pneumonia/Acute respiratory distress - bronchodilator treatment ordered -  starting doxycycline 100 mg BID   Disposition/Need for  in-Hospital Stay- patient unable to be discharged at this time due to urinary difficulties with AKI on CKD requiring in and out catheterization* -Possible discharge home tomorrow if renal function remains stable and respiratory status improves -She will keep indwelling Foley catheter discharge with outpatient urology follow-up for voiding trial  Disposition: The patient is from: Home              Anticipated d/c is to: Home with [facility]              Anticipated d/c date is: 1 day              Patient currently is not medically stable to d/c. Barriers: Not Clinically Stable-   Code Status :  -  Code Status: DNR   Family Communication:     (patient is alert, awake and coherent)  -Discussed with [name]  [phone] bedside update 11/23  DVT Prophylaxis  :   - SCDs  Place and maintain sequential compression device Start: 08/28/22 1456 Place TED hose Start: 08/28/22 1456 SCDs Start: 08/27/22 0700 warfarin (COUMADIN) tablet 4 mg   Lab Results  Component Value Date   PLT 204 08/30/2022   Inpatient Medications  Scheduled Meds:  allopurinol  100 mg Oral Daily   amLODipine  10 mg Oral Daily   atorvastatin  80 mg Oral q1800   Chlorhexidine Gluconate Cloth  6 each Topical Daily   famotidine  20 mg Oral QHS   feeding supplement (GLUCERNA SHAKE)  237 mL Oral TID BM   ferrous sulfate  325 mg Oral Q breakfast   furosemide  60 mg Oral QPM   furosemide  80 mg Oral Q breakfast   hydrALAZINE  100 mg Oral TID   insulin aspart  0-5 Units Subcutaneous QHS   insulin aspart  0-9 Units Subcutaneous TID WC   insulin glargine-yfgn  10 Units Subcutaneous QHS   metoprolol succinate  25 mg Oral Daily   pantoprazole  40 mg Oral BID   polyethylene glycol  17 g Oral BID   senna-docusate  2 tablet Oral QHS   warfarin  4 mg Oral ONCE-1600   Warfarin - Pharmacist Dosing Inpatient   Does not apply q1600   Continuous Infusions: PRN Meds:.acetaminophen **OR** acetaminophen, ALPRAZolam, levalbuterol,  ondansetron **OR** ondansetron (ZOFRAN) IV, oxyCODONE   Anti-infectives (From admission, onward)    None       Subjective: [name] pt having respiratory difficulty this morning, short of breath, no CP  Objective: Vitals:   08/30/22 2312 08/31/22 0510 08/31/22 0531 08/31/22 0945  BP:  (!) 150/63    Pulse:  (!) 104    Resp:  20    Temp:  98.6 F (37 C)    TempSrc:      SpO2: 90% 92%  93%  Weight:   63.2 kg   Height:        Intake/Output Summary (Last 24 hours) at 08/31/2022 1416 Last data filed at 08/31/2022 1256 Gross per 24 hour  Intake 360 ml  Output 2252 ml  Net -1892 ml   Filed Weights   08/26/22 2157 08/30/22 0425 08/31/22 0531  Weight: 63 kg 64.5 kg 63.2 kg   Physical Exam  Gen:- frail, elderly female, Awake Alert, no acute distress HEENT:- [place]cifer.AT, No sclera icterus Nose- [place] 2L/min Neck-Supple Neck, +ve JVD,.  Lungs-rales heard on LUL, tachypnea   CV- normal S1, S2 sounds.   Abd-  +  ve B.Sounds, Abd Soft, No tenderness,  Extremity/Skin:- trace pretibial edema, pedal pulses present  Psych-affect is flat; oriented x3 Neuro-generalized weakness no new focal deficits, no tremors GU- foley placed, amber urine seen.   Data Reviewed: I have personally reviewed following labs and imaging studies  CBC: Recent Labs  Lab 08/26/22 2234 08/27/22 0700 08/29/22 0401 08/30/22 0335  WBC 6.9 8.7 9.2 9.2  NEUTROABS 4.1  --   --   --   HGB 9.6* 10.1* 9.6* 9.7*  HCT 29.6* 31.6* 30.8* 31.3*  MCV 94.9 95.5 97.5 96.9  PLT 163 201 171 458   Basic Metabolic Panel: Recent Labs  Lab 08/26/22 2234 08/27/22 0700 08/28/22 0757 08/29/22 0401 08/30/22 0335  NA 133* 133* 135 135 135  K 4.1 4.2 4.4 4.6 4.8  CL 100 102 107 107 106  CO2 23 23 21* 21* 22  GLUCOSE 177* 186* 159* 151* 184*  BUN 50* 49* 39* 40* 42*  CREATININE 3.00* 2.78* 2.24* 2.30* 2.51*  CALCIUM 8.7* 8.6* 8.6* 8.2* 8.0*  MG  --  2.4  --   --   --   PHOS  --  3.9 4.0  --   --    GFR: Estimated  Creatinine Clearance: 14.1 mL/min (A) (by C-G formula based on SCr of 2.51 mg/dL (H)). Liver Function Tests: Recent Labs  Lab 08/26/22 2234 08/27/22 0700 08/28/22 0757  AST 30 30  --   ALT 15 15  --   ALKPHOS 106 112  --   BILITOT 0.8 0.7  --   PROT 5.1* 5.1*  --   ALBUMIN 2.8* 2.8* 2.8*   Radiology Studies: DG CHEST PORT 1 VIEW  Result Date: 08/31/2022 CLINICAL DATA:  Increasing shortness of breath. EXAM: PORTABLE CHEST 1 VIEW COMPARISON:  08/30/2022 FINDINGS: The cardio pericardial silhouette is enlarged. Retrocardiac left base atelectasis or infiltrate again noted with probable tiny effusion. Interstitial markings are diffusely coarsened with chronic features. Interval development of focal airspace opacity in the parahilar right lung along the minor fissure. Pulmonary nodule in the right mid lung and left base are stable compatible with granulomas. IMPRESSION: 1. Interval development of focal airspace opacity in the parahilar right lung along the minor fissure. This could be atelectasis or pneumonia. 2. Retrocardiac left base atelectasis or infiltrate with probable tiny effusion. Electronically Signed   By: Misty Stanley M.D.   On: 08/31/2022 08:41   DG CHEST PORT 1 VIEW  Result Date: 08/30/2022 CLINICAL DATA:  Pulmonary edema. EXAM: PORTABLE CHEST 1 VIEW COMPARISON:  August 29, 2022. FINDINGS: Mild cardiomegaly is noted. Status post coronary bypass graft. Mild central pulmonary vascular congestion and bilateral pulmonary edema is noted with small bilateral pleural effusions. Bony thorax is unremarkable. IMPRESSION: Mild cardiomegaly with central pulmonary vascular congestion and bilateral pulmonary edema. Small bilateral pleural effusions are noted. Aortic Atherosclerosis (ICD10-I70.0). Electronically Signed   By: Marijo Conception M.D.   On: 08/30/2022 09:23     Scheduled Meds:  allopurinol  100 mg Oral Daily   amLODipine  10 mg Oral Daily   atorvastatin  80 mg Oral q1800    Chlorhexidine Gluconate Cloth  6 each Topical Daily   famotidine  20 mg Oral QHS   feeding supplement (GLUCERNA SHAKE)  237 mL Oral TID BM   ferrous sulfate  325 mg Oral Q breakfast   furosemide  60 mg Oral QPM   furosemide  80 mg Oral Q breakfast   hydrALAZINE  100 mg Oral TID  insulin aspart  0-5 Units Subcutaneous QHS   insulin aspart  0-9 Units Subcutaneous TID WC   insulin glargine-yfgn  10 Units Subcutaneous QHS   metoprolol succinate  25 mg Oral Daily   pantoprazole  40 mg Oral BID   polyethylene glycol  17 g Oral BID   senna-docusate  2 tablet Oral QHS   warfarin  4 mg Oral ONCE-1600   Warfarin - Pharmacist Dosing Inpatient   Does not apply q1600   Continuous Infusions:   LOS: 2 days   Irwin Brakeman M.D on 08/31/2022 at 2:16 PM  Go to [email or web address] - for contact info  [facility] - Office  [phone]  If 7PM-7AM, please contact night-coverage [email or web address] 08/31/2022, 2:16 PM

## 2022-08-31 NOTE — Progress Notes (Signed)
ANTICOAGULATION CONSULT NOTE -   Pharmacy Consult for warfarin Indication: atrial fibrillation/mechanical aortic valve  Labs: Recent Labs    08/29/22 0401 08/30/22 0335 08/31/22 0433  HGB 9.6* 9.7*  --   HCT 30.8* 31.3*  --   PLT 171 204  --   LABPROT 24.3* 32.6* 30.3*  INR 2.2* 3.2* 2.9*  CREATININE 2.30* 2.51*  --     Estimated Creatinine Clearance: 14.1 mL/min (A) (by C-G formula based on SCr of 2.51 mg/dL (H)).  Assessment: Pharmacy consulted to dose warfarin in patient with atrial fibrillation and mechanical aortic valve.  Patient's INR on admission is 2.1 and home dose listed as 7.5 mg on Sunday and 5 mg ROW.  Patient continues to be sensitive to coumadin given acute illness. INR trend 2.2>3.2>2.9, dose held yesterday.  Goal of Therapy:  INR 2-3 Monitor platelets by anticoagulation protocol: Yes   Plan:  Coumadin '4mg'$  po x 1 Monitor daily INR and s/s of bleeding  Lorenso Courier, PharmD Clinical Pharmacist 08/31/2022 11:32 AM

## 2022-09-01 DIAGNOSIS — N189 Chronic kidney disease, unspecified: Secondary | ICD-10-CM | POA: Diagnosis not present

## 2022-09-01 DIAGNOSIS — N179 Acute kidney failure, unspecified: Secondary | ICD-10-CM | POA: Diagnosis not present

## 2022-09-01 DIAGNOSIS — R338 Other retention of urine: Secondary | ICD-10-CM | POA: Diagnosis not present

## 2022-09-01 DIAGNOSIS — I5033 Acute on chronic diastolic (congestive) heart failure: Secondary | ICD-10-CM | POA: Diagnosis not present

## 2022-09-01 LAB — GLUCOSE, CAPILLARY
Glucose-Capillary: 181 mg/dL — ABNORMAL HIGH (ref 70–99)
Glucose-Capillary: 197 mg/dL — ABNORMAL HIGH (ref 70–99)
Glucose-Capillary: 112 mg/dL — ABNORMAL HIGH (ref 70–99)
Glucose-Capillary: 111 mg/dL — ABNORMAL HIGH (ref 70–99)

## 2022-09-01 LAB — PROTIME-INR
INR: 2.4 — ABNORMAL HIGH (ref 0.8–1.2)
Prothrombin Time: 26.2 seconds — ABNORMAL HIGH (ref 11.4–15.2)

## 2022-09-01 LAB — BASIC METABOLIC PANEL
Anion gap: 9 (ref 5–15)
BUN: 41 mg/dL — ABNORMAL HIGH (ref 8–23)
CO2: 23 mmol/L (ref 22–32)
Calcium: 8.1 mg/dL — ABNORMAL LOW (ref 8.9–10.3)
Chloride: 101 mmol/L (ref 98–111)
Creatinine, Ser: 2.2 mg/dL — ABNORMAL HIGH (ref 0.44–1.00)
GFR, Estimated: 22 mL/min — ABNORMAL LOW (ref >60)
Glucose, Bld: 219 mg/dL — ABNORMAL HIGH (ref 70–99)
Potassium: 4.1 mmol/L (ref 3.5–5.1)
Sodium: 133 mmol/L — ABNORMAL LOW (ref 135–145)

## 2022-09-01 LAB — MAGNESIUM: Magnesium: 2.1 mg/dL (ref 1.7–2.4)

## 2022-09-01 MED ORDER — WARFARIN SODIUM 5 MG PO TABS
5.0000 mg | ORAL_TABLET | Freq: Once | ORAL | Status: AC
  Administered 2022-09-01: 5 mg via ORAL
  Filled 2022-09-01: qty 1

## 2022-09-01 NOTE — NC FL2 (Signed)
[place] LEVEL OF CARE SCREENING TOOL     IDENTIFICATION  Patient Name: [name] Birthdate: 21-Jun-1941 Sex: female Admission Date (Current Location): 08/26/2022  [facility] and [place] Number:  [employer] and Address:  [place] [address] [place]      Provider Number: [id number]  Attending Physician Name and Address:  Murlean Iba, MD  Relative Name and Phone Number:       Current Level of Care: Hospital Recommended Level of Care: [place] Prior Approval Number:    Date Approved/Denied:   PASRR Number: [id number]  Discharge Plan: SNF    Current Diagnoses: Patient Active Problem List   Diagnosis Date Noted   Acute on chronic heart failure with preserved ejection fraction (HFpEF) ([place]) 08/29/2022   Acute exacerbation of CHF (congestive heart failure) ([place]) 08/29/2022   Acute urinary retention 08/28/2022   Acute kidney injury superimposed on chronic kidney disease ([place]) 08/27/2022   Constipation 08/27/2022   Nausea 08/27/2022   Hypoalbuminemia due to protein-calorie malnutrition ([place]) 08/27/2022   Type 2 diabetes mellitus with hyperglycemia ([place]) 08/27/2022   Palliative care encounter    Goals of care, counseling/discussion    Counseling and coordination of care    GI bleed 07/09/2022   GI bleeding 07/08/2022   Acute on chronic anemia 07/08/2022   History of Clostridioides difficile colitis 07/08/2022   DNR (do not resuscitate)/DNI(Do Not Intubate) 07/08/2022   Anemia of chronic disease 07/07/2022   Pressure injury of skin 06/23/2022   UTI (urinary tract infection) 06/22/2022   Joint pain 06/09/2022   Parietoalveolar pneumopathy ([place]) 06/09/2022   Primary osteoarthritis 06/09/2022   Rheumatoid factor positive 06/09/2022   C. difficile colitis 04/19/2022   Colitis 04/18/2022   PAH (pulmonary artery hypertension) ([place]) 04/18/2022   Emphysematous cystitis 04/13/2022   Female  bladder prolapse 04/13/2022   Diverticulitis 04/12/2022   Autoimmune hemolytic anemia (HCC) 01/02/2022   Bilateral lower extremity edema 01/02/2022   Refractory anemia (HCC) 12/15/2021   Abnormal chest x-ray 12/15/2021   Supratherapeutic INR 12/15/2021   Elevated troponin 12/15/2021   Unilateral primary osteoarthritis, right hip 05/31/2021   Midline cystocele 05/30/2021   Primary localized osteoarthritis of pelvic region and thigh 05/30/2021   Vaginal pain 05/30/2021   Asymmetric SNHL (sensorineural hearing loss) 01/06/2021   Referred otalgia of both ears 01/06/2021   Bilateral hearing loss 08/31/2020   Low back pain 02/20/2020   CKD (chronic kidney disease), stage IV ([place]) 10/13/2019   Leukopenia due to antineoplastic chemotherapy ([place]) 08/18/2019   Thrombophilia ([place]) 06/19/2019   Encounter for general adult medical examination without abnormal findings 06/13/2019   Arteritis ([place]) 06/06/2019   Hypertensive heart and chronic kidney disease with heart failure and stage 1 through stage 4 chronic kidney disease, or unspecified chronic kidney disease ([place]) 06/06/2019   Muscle weakness 06/06/2019   Pancytopenia ([place]) 05/28/2019   Dyspnea 05/28/2019   Vasculitis, ANCA positive 05/28/2019   Disorder of connective tissue ([place]) 05/14/2019   Renal insufficiency 05/08/2019   PAF (paroxysmal atrial fibrillation) ([place]) 04/07/2019   Iron deficiency anemia 02/28/2019   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 ([place]) 02/28/2019   Diabetic retinopathy associated with type 2 diabetes mellitus ([place]) 09/02/2018   Chronic pain 06/19/2018   Non-thrombocytopenic purpura ([place]) 05/21/2018   Gout 03/22/2018   Malnutrition of moderate degree 03/12/2018   Diabetes mellitus type 2, controlled ([place]) 03/06/2018   Chronic atrial fibrillation 03/06/2018   H/O mechanical  aortic valve replacement 03/06/2018   Moderate to severe pulmonary hypertension ([place]) 03/06/2018   GERD without  esophagitis 02/21/2018   Anxiety 02/21/2018   Chronic diastolic CHF (congestive heart failure) ([place]) 02/21/2018   General weakness 02/02/2018   Thrombocytopenia ([place]) 01/25/2018   Arm pain, diffuse 01/25/2018   Anemia 01/23/2018   Aortic atherosclerosis ([place]) 01/23/2018   Age-related osteoporosis without current pathological fracture 05/24/2017   Carotid artery occlusion 05/24/2017   Generalized anxiety disorder 05/24/2017   Hearing loss 05/24/2017   Presence of prosthetic heart valve 05/24/2017   Carotid artery disease ([place]) 08/18/2013   Peripheral arterial disease ([place]) 08/18/2013   Diabetes mellitus without complication ([place]) 05/22/4817   Essential hypertension    Hypercholesteremia    Mechanical heart valve present    Chronic anticoagulation     Orientation RESPIRATION BLADDER Height & Weight     Self, Time, Situation, Place  O2 (2.5L) Indwelling catheter Weight: 133 lb 9.6 oz (60.6 kg) Height:  5' (152.4 cm)  BEHAVIORAL SYMPTOMS/MOOD NEUROLOGICAL BOWEL NUTRITION STATUS      Continent Diet (Heart healthy/carb modified. See d/c summary for updates.)  AMBULATORY STATUS COMMUNICATION OF NEEDS Skin   Extensive Assist Verbally Bruising                       Personal Care Assistance Level of Assistance  Bathing, Feeding, Dressing Bathing Assistance: Maximum assistance Feeding assistance: Limited assistance Dressing Assistance: Maximum assistance     Functional Limitations Info  Sight, Hearing, Speech Sight Info: Impaired Hearing Info: Impaired Speech Info: Adequate    SPECIAL CARE FACTORS FREQUENCY  PT (By licensed PT)     PT Frequency: 5x weekly              Contractures      Additional Factors Info  Psychotropic Code Status Info: DNR Allergies Info: Flagyl (metronidazole), Augmentin (amoxicillin-pot Clavulanate), Ciprofloxacin, Coreg (carvedilol), Diflucan (fluconazole), Losartan, Privigen (immune Globulin (human)), Sulfamethoxazole-trimethoprim,  Verapamil, Zetia (ezetimibe), Zocor (simvastatin- High dose), Gatifloxacin Psychotropic Info: Xanax         Current Medications (09/01/2022):  This is the current hospital active medication list Current Facility-Administered Medications  Medication Dose Route Frequency Provider Last Rate Last Admin   acetaminophen (TYLENOL) tablet 650 mg  650 mg Oral Q6H PRN Adefeso, Oladapo, DO   650 mg at 08/27/22 5631   Or   acetaminophen (TYLENOL) suppository 650 mg  650 mg Rectal Q6H PRN Adefeso, Oladapo, DO       allopurinol (ZYLOPRIM) tablet 100 mg  100 mg Oral Daily Adefeso, Oladapo, DO   100 mg at 09/01/22 0904   ALPRAZolam (XANAX) tablet 0.25 mg  0.25 mg Oral BID PRN Roxan Hockey, MD   0.25 mg at 09/01/22 0455   amLODipine (NORVASC) tablet 10 mg  10 mg Oral Daily Adefeso, Oladapo, DO   10 mg at 09/01/22 0904   atorvastatin (LIPITOR) tablet 80 mg  80 mg Oral q1800 Adefeso, Oladapo, DO   80 mg at 08/31/22 1700   Chlorhexidine Gluconate Cloth 2 % PADS 6 each  6 each Topical Daily Zierle-Ghosh, Asia B, DO   6 each at 09/01/22 0905   doxycycline (VIBRA-TABS) tablet 100 mg  100 mg Oral Q12H Johnson, Clanford L, MD   100 mg at 09/01/22 0904   famotidine (PEPCID) tablet 20 mg  20 mg Oral QHS Adefeso, Oladapo, DO   20 mg at 08/31/22 2319   feeding supplement (GLUCERNA SHAKE) (GLUCERNA SHAKE) liquid 237 mL  237  mL Oral TID BM Adefeso, Oladapo, DO   237 mL at 08/30/22 1418   ferrous sulfate tablet 325 mg  325 mg Oral Q breakfast Adefeso, Oladapo, DO   325 mg at 09/01/22 0905   furosemide (LASIX) tablet 60 mg  60 mg Oral QPM Johnson, Clanford L, MD   60 mg at 08/31/22 1651   furosemide (LASIX) tablet 80 mg  80 mg Oral Q breakfast Lu Duffel, RPH   80 mg at 09/01/22 2458   hydrALAZINE (APRESOLINE) tablet 100 mg  100 mg Oral TID Bernadette Hoit, DO   100 mg at 09/01/22 0903   insulin aspart (novoLOG) injection 0-5 Units  0-5 Units Subcutaneous QHS Adefeso, Oladapo, DO       insulin aspart (novoLOG)  injection 0-9 Units  0-9 Units Subcutaneous TID WC Adefeso, Oladapo, DO   2 Units at 09/01/22 1222   insulin glargine-yfgn (SEMGLEE) injection 10 Units  10 Units Subcutaneous QHS Adefeso, Oladapo, DO   10 Units at 08/31/22 2317   levalbuterol (XOPENEX) nebulizer solution 0.63 mg  0.63 mg Nebulization Q6H PRN Emokpae, Courage, MD   0.63 mg at 08/31/22 0944   metoprolol succinate (TOPROL-XL) 24 hr tablet 25 mg  25 mg Oral Daily Johnson, Clanford L, MD   25 mg at 09/01/22 0903   ondansetron (ZOFRAN) tablet 4 mg  4 mg Oral Q6H PRN Adefeso, Oladapo, DO   4 mg at 09/01/22 0910   Or   ondansetron (ZOFRAN) injection 4 mg  4 mg Intravenous Q6H PRN Adefeso, Oladapo, DO   4 mg at 08/31/22 2327   oxyCODONE (Oxy IR/ROXICODONE) immediate release tablet 5 mg  5 mg Oral Q4H PRN Emokpae, Courage, MD   5 mg at 08/31/22 1025   pantoprazole (PROTONIX) EC tablet 40 mg  40 mg Oral BID Adefeso, Oladapo, DO   40 mg at 09/01/22 0905   polyethylene glycol (MIRALAX / GLYCOLAX) packet 17 g  17 g Oral BID Emokpae, Courage, MD   17 g at 09/01/22 1222   senna-docusate (Senokot-S) tablet 2 tablet  2 tablet Oral QHS Roxan Hockey, MD   2 tablet at 08/31/22 2319   warfarin (COUMADIN) tablet 5 mg  5 mg Oral ONCE-1600 Murlean Iba, MD       Warfarin - Pharmacist Dosing Inpatient   Does not apply [id number] Roxan Hockey, MD   Given at 08/31/22 1650     Discharge Medications: Please see discharge summary for a list of discharge medications.  Relevant Imaging Results:  Relevant Lab Results:   Additional Information SSN: [id number]  [name], LCSW

## 2022-09-01 NOTE — TOC Progression Note (Signed)
Transition of Care [facility]) - Progression Note    Patient Details  Name: [name] MRN: [id number] Date of Birth: 1941-09-10  [facility]) CM/SW Contact  [name], [place] Phone Number: 09/01/2022, 3:11 PM  Clinical Narrative: Family now interested in SNF. PT re-evaluated and recommend SNF. Discussed with daughter, [name] who states pt was recently at [facility] for about 2 weeks. [name] is aware pt is close to copay days and is agreeable to SNF. She states she has Medicare.gov star rating list and requests 4+ star facilities if possible. Family is open to SNF in [facility] or [place]. Will initiate bed search.       Expected Discharge Plan: [place] Barriers to Discharge: Barriers Resolved  Expected Discharge Plan and Services Expected Discharge Plan: [place] In-house Referral: Clinical Social Work   Post Acute Care Choice: Resumption of Svcs/PTA Provider Living arrangements for the past 2 months: Single Family Home Expected Discharge Date: 08/29/22                         HH Arranged: PT, OT HH Agency: [place] Date [place]: 08/30/22 Time [place]: [id number] Representative spoke with at [place]: [place]) Interventions    Readmission Risk Interventions    08/30/2022   11:42 AM 04/20/2022   12:02 PM  Readmission Risk Prevention Plan  Transportation Screening Complete Complete  Medication Review Press photographer) Complete Complete  PCP or Specialist appointment within 3-5 days of discharge  Complete  HRI or [place] Complete Complete  SW Recovery Care/Counseling Consult Complete Complete  Palliative Care Screening Not Applicable Not [place] Not Applicable Not Applicable

## 2022-09-01 NOTE — Care Management Important Message (Signed)
Important Message  Patient Details  Name: [name] MRN: [id number] Date of Birth: 27-Sep-1941   Medicare Important Message Given:  Other (see comment) (spoke with daughter [name], copy ;eft in room 11/22, no additonal copy needed)     Tommy Medal 09/01/2022, 3:39 PM

## 2022-09-01 NOTE — Progress Notes (Signed)
PROGRESS NOTE     [name], is a 81 y.o. female, DOB - [birth year], KCL:[id number]  Admit date - 08/26/2022   Admitting Physician Courage Denton Brick, MD  Outpatient Primary MD for the patient is [name], [name], MD  LOS - 3  Cc---difficulty voiding     Brief Summary: 81 y.o. female with medical history significant of  history of mechanical aortic valve on chronic Coumadin, ANCA positive vasculitis on prednisone, chronic A-fib, chronic diastolic heart failure, CKD stage IV (baseline creatinine 1.9-2.1), type 2 diabetes mellitus admitted with abd pain/constipation and concerns for AKI on CKD IV 08/29/22--- patient with increasing shortness of breath fatigue and dyspnea on exertion, chest x-ray suggest CHF, BNP 498... Now requiring IV Lasix and close monitoring of renal function given underlying CKD 4     A/p 1)HFpEF--- acute on chronic diastolic dysfunction CHF--echo with EF of 55 to 60% --- patient with increasing shortness of breath fatigue and dyspnea on exertion, chest x-ray suggest CHF, BNP 498.  -O2 sats 90% on room air Treated initially with IV Lasix and close monitoring of renal function given underlying CKD 4 -CXR with some improvement; transitioned back to home oral lasix dosing   Intake/Output Summary (Last 24 hours) at 09/01/2022 1641 Last data filed at 09/01/2022 0501 Gross per 24 hour  Intake --  Output 1350 ml  Net -1350 ml   Filed Weights   08/30/22 0425 08/31/22 0531 09/01/22 0500  Weight: 64.5 kg 63.2 kg 60.6 kg    2)Acute kidney injury superimposed on CKD stage IV On admission BUN/creatinine 50/3.0 (baseline creatinine at 1.9-2.1) -Creatinine trending down with hydration (down to 2.30 from 3.0 on admission) Renally adjust medications, avoid nephrotoxic agents/dehydration/hypotension -Voiding difficulties persist as below #3 -monitor renal function on Lasix   3)Acute urinary retention ----over 1 Liter of urine in bladder---voiding difficulties persist-  requiring in and out  -c/n  Flomax...Marland KitchenMarland KitchenMarland Kitchen plan to be discharged home with Foley catheter with outpatient follow-up with urology for urodynamic studies/voiding trial  4)Acute on chronic constipation--- Abdominal improved after BMs with laxatives --On admission CT abdomen and pelvis without acute findings Continue MiraLAX, Senokot   5)Chronic iron deficiency anemia--- patient has history of refractory anemia suspect patient may also have some degree of anemia of CKD -Hgb currently stable close to baseline above 9 Continue ferrous sulfate   6)controlled type 2 DM - last A1c 6.3 reflecting excellent diabetic control PTA -Continue Semglee 10 units and adjust dose accordingly Use Novolog/Humalog Sliding scale insulin with Accu-Cheks/Fingersticks as ordered   CBG (last 3)  Recent Labs    08/31/22 2115 09/01/22 0739 09/01/22 1138  GLUCAP 149* 181* 197*    7)ANCA Vasculitis Rash on legs erupted bilaterally after prednisone taper -Low index of suspicion for superimposed cellulitis -Currently off prednisone -Wound care consult for lower Extremity skin concerns   8)Chronic atrial fibrillation Continue Toprol for rate control ----Stroke prophylaxis with Coumadin -Echo from 05/24/2021 with EF of 55 to 60% with indeterminate diagnostic parameters  9) history of Aortic repair with mechanical valve  -Pharmacy to manage Coumadin therapy -Echo from May 24, 2022 showed- St Jude mechanical aortic valve. Mean gradient 26 mmHg with DI 0.33, EOA 1.00 cm^2. This suggests a degree of prosthetic valve stenosis (prior  mean gradient 21 mmHg). No significant regurgitation.  -Outpatient follow-up with cardiology advised   10)GERD Continue Pepcid, Protonix   11)Essential hypertension Continue Toprol-XL, hydralazine, amlodipine   12)Gout Continue allopurinol  Pneumonia/Acute respiratory distress - bronchodilator treatment ordered - starting  doxycycline 100 mg BID x 3 days course    Disposition/Need for in-Hospital Stay- patient unable to be discharged at this time due to urinary difficulties with AKI on CKD requiring in and out catheterization* -Possible discharge home tomorrow if renal function remains stable and respiratory status improves -She will keep indwelling Foley catheter discharge with outpatient urology follow-up for voiding trial  Disposition: The patient is from: Home              Anticipated d/c is to: Pt agreed to SNF rehab; working on  placement               Anticipated d/c date is: 1 day              Patient currently is medically stable to d/c. Awaiting SNF bed placement  Barriers: Not Clinically Stable-   Code Status :  -  Code Status: DNR   Family Communication:     (patient is alert, awake and coherent)  -Discussed with Waymon Budge  [phone] bedside update 11/23  DVT Prophylaxis  :   - SCDs  Place and maintain sequential compression device Start: 08/28/22 1456 Place TED hose Start: 08/28/22 1456 SCDs Start: 08/27/22 0700   Lab Results  Component Value Date   PLT 204 08/30/2022   Inpatient Medications  Scheduled Meds:  allopurinol  100 mg Oral Daily   amLODipine  10 mg Oral Daily   atorvastatin  80 mg Oral q1800   Chlorhexidine Gluconate Cloth  6 each Topical Daily   doxycycline  100 mg Oral Q12H   famotidine  20 mg Oral QHS   feeding supplement (GLUCERNA SHAKE)  237 mL Oral TID BM   ferrous sulfate  325 mg Oral Q breakfast   furosemide  60 mg Oral QPM   furosemide  80 mg Oral Q breakfast   hydrALAZINE  100 mg Oral TID   insulin aspart  0-5 Units Subcutaneous QHS   insulin aspart  0-9 Units Subcutaneous TID WC   insulin glargine-yfgn  10 Units Subcutaneous QHS   metoprolol succinate  25 mg Oral Daily   pantoprazole  40 mg Oral BID   polyethylene glycol  17 g Oral BID   senna-docusate  2 tablet Oral QHS   Warfarin - Pharmacist Dosing Inpatient   Does not apply q1600   Continuous Infusions: PRN Meds:.acetaminophen  **OR** acetaminophen, ALPRAZolam, levalbuterol, ondansetron **OR** ondansetron (ZOFRAN) IV, oxyCODONE   Anti-infectives (From admission, onward)    Start     Dose/Rate Route Frequency Ordered Stop   08/31/22 1515  doxycycline (VIBRA-TABS) tablet 100 mg        100 mg Oral Every 12 hours 08/31/22 1422         Subjective: [name] pt c/o of abdominal discomfort; gas and needs to have a bowel movement.    Objective: Vitals:   09/01/22 0452 09/01/22 0500 09/01/22 1519 09/01/22 1613  BP: (!) 156/61  (!) 142/62   Pulse: (!) 101  94   Resp: 20  16   Temp: 98.2 F (36.8 C)  98.4 F (36.9 C)   TempSrc: Oral  Oral   SpO2:   94% 96%  Weight:  60.6 kg    Height:        Intake/Output Summary (Last 24 hours) at 09/01/2022 1641 Last data filed at 09/01/2022 0501 Gross per 24 hour  Intake --  Output 1350 ml  Net -1350 ml   Filed Weights   08/30/22 0425 08/31/22 0531  09/01/22 0500  Weight: 64.5 kg 63.2 kg 60.6 kg   Physical Exam  Gen:- frail, elderly female, Awake Alert, no acute distress HEENT:- [place].AT, No sclera icterus Nose- [place] 2L/min Neck-Supple Neck, +ve JVD,.  Lungs-rales heard on LUL, tachypnea   CV- normal S1, S2 sounds.   Abd-  +ve B.Sounds, Abd Soft, No tenderness,  Extremity/Skin:- trace pretibial edema, pedal pulses present  Psych-affect is flat; oriented x3 Neuro-generalized weakness no new focal deficits, no tremors GU- foley placed, amber urine seen.   Data Reviewed: I have personally reviewed following labs and imaging studies  CBC: Recent Labs  Lab 08/26/22 2234 08/27/22 0700 08/29/22 0401 08/30/22 0335  WBC 6.9 8.7 9.2 9.2  NEUTROABS 4.1  --   --   --   HGB 9.6* 10.1* 9.6* 9.7*  HCT 29.6* 31.6* 30.8* 31.3*  MCV 94.9 95.5 97.5 96.9  PLT 163 201 171 244   Basic Metabolic Panel: Recent Labs  Lab 08/27/22 0700 08/28/22 0757 08/29/22 0401 08/30/22 0335 09/01/22 0429  NA 133* 135 135 135 133*  K 4.2 4.4 4.6 4.8 4.1  CL 102 107 107 106 101   CO2 23 21* 21* 22 23  GLUCOSE 186* 159* 151* 184* 219*  BUN 49* 39* 40* 42* 41*  CREATININE 2.78* 2.24* 2.30* 2.51* 2.20*  CALCIUM 8.6* 8.6* 8.2* 8.0* 8.1*  MG 2.4  --   --   --  2.1  PHOS 3.9 4.0  --   --   --    GFR: Estimated Creatinine Clearance: 15.8 mL/min (A) (by C-G formula based on SCr of 2.2 mg/dL (H)). Liver Function Tests: Recent Labs  Lab 08/26/22 2234 08/27/22 0700 08/28/22 0757  AST 30 30  --   ALT 15 15  --   ALKPHOS 106 112  --   BILITOT 0.8 0.7  --   PROT 5.1* 5.1*  --   ALBUMIN 2.8* 2.8* 2.8*   Radiology Studies: DG CHEST PORT 1 VIEW  Result Date: 08/31/2022 CLINICAL DATA:  Increasing shortness of breath. EXAM: PORTABLE CHEST 1 VIEW COMPARISON:  08/30/2022 FINDINGS: The cardio pericardial silhouette is enlarged. Retrocardiac left base atelectasis or infiltrate again noted with probable tiny effusion. Interstitial markings are diffusely coarsened with chronic features. Interval development of focal airspace opacity in the parahilar right lung along the minor fissure. Pulmonary nodule in the right mid lung and left base are stable compatible with granulomas. IMPRESSION: 1. Interval development of focal airspace opacity in the parahilar right lung along the minor fissure. This could be atelectasis or pneumonia. 2. Retrocardiac left base atelectasis or infiltrate with probable tiny effusion. Electronically Signed   By: Misty Stanley M.D.   On: 08/31/2022 08:41     Scheduled Meds:  allopurinol  100 mg Oral Daily   amLODipine  10 mg Oral Daily   atorvastatin  80 mg Oral q1800   Chlorhexidine Gluconate Cloth  6 each Topical Daily   doxycycline  100 mg Oral Q12H   famotidine  20 mg Oral QHS   feeding supplement (GLUCERNA SHAKE)  237 mL Oral TID BM   ferrous sulfate  325 mg Oral Q breakfast   furosemide  60 mg Oral QPM   furosemide  80 mg Oral Q breakfast   hydrALAZINE  100 mg Oral TID   insulin aspart  0-5 Units Subcutaneous QHS   insulin aspart  0-9 Units  Subcutaneous TID WC   insulin glargine-yfgn  10 Units Subcutaneous QHS   metoprolol succinate  25  mg Oral Daily   pantoprazole  40 mg Oral BID   polyethylene glycol  17 g Oral BID   senna-docusate  2 tablet Oral QHS   Warfarin - Pharmacist Dosing Inpatient   Does not apply q1600   Continuous Infusions:   LOS: 3 days   Irwin Brakeman M.D on 09/01/2022 at 4:41 PM  Go to [email or web address] - for contact info  [facility] - Office  [phone]  If 7PM-7AM, please contact night-coverage [email or web address] 09/01/2022, 4:41 PM

## 2022-09-01 NOTE — Progress Notes (Signed)
ANTICOAGULATION CONSULT NOTE -   Pharmacy Consult for warfarin Indication: atrial fibrillation/mechanical aortic valve  Allergies  Allergen Reactions   Flagyl [Metronidazole] Rash and Other (See Comments)    ALL-OVER BODY RASH   Augmentin [Amoxicillin-Pot Clavulanate] Diarrhea and Nausea And Vomiting   Ciprofloxacin Diarrhea, Nausea Only and Other (See Comments)    Stomach ache   Coreg [Carvedilol] Other (See Comments)    Terrible cramping in the feet and had a lot of bowel movements, but not diarrhea   Diflucan [Fluconazole] Diarrhea   Losartan Swelling and Other (See Comments)    Patient doesn't recall site of swelling   Privigen [Immune Globulin (Human)] Other (See Comments)    Severe/excruciating pain   Sulfamethoxazole-Trimethoprim Diarrhea, Nausea Only and Other (See Comments)    Stomach upset   Verapamil Hives   Zetia [Ezetimibe] Other (See Comments)    Reaction not recalled   Zocor [Simvastatin - High Dose] Other (See Comments)    Reaction not recalled   Gatifloxacin Rash and Other (See Comments)    Redness to skin around eye    Patient Measurements: Height: 5' (152.4 cm) Weight: 60.6 kg (133 lb 9.6 oz) IBW/kg (Calculated) : 45.5 Heparin Dosing Weight:   Vital Signs: Temp: 98.2 F (36.8 C) (11/24 0452) Temp Source: Oral (11/24 0452) BP: 156/61 (11/24 0452) Pulse Rate: 101 (11/24 0452)  Labs: Recent Labs    08/30/22 0335 08/31/22 0433 09/01/22 0429  HGB 9.7*  --   --   HCT 31.3*  --   --   PLT 204  --   --   LABPROT 32.6* 30.3* 26.2*  INR 3.2* 2.9* 2.4*  CREATININE 2.51*  --  2.20*     Estimated Creatinine Clearance: 15.8 mL/min (A) (by C-G formula based on SCr of 2.2 mg/dL (H)).   Medical History: Past Medical History:  Diagnosis Date   Aortic atherosclerosis ([place]) 01/23/2018   Atrial fibrillation ([facility])    Benign positional vertigo 10/06/2011   Carotid artery disease ([place])    Chemotherapy induced neutropenia ([place]) 07/21/2019    Cholelithiasis 01/23/2018   Cholelithiasis 01/23/2018   Chronic anticoagulation    Colovesical fistula, ruled out 04/14/2022   Coronary artery disease    status post coronary artery bypass grafting times 07/10/2004   Diabetes mellitus    Hypercholesteremia    Hypertension    Mechanical heart valve present    H. aortic valve replacement at the time of bypass surgery October 2005   Moderate to severe pulmonary hypertension ([place])    Peripheral arterial disease ([place])    history of left common iliac artery PTA and stenting for a chronic total occlusion 08/26/01   S/P cholecystectomy 03/06/2018    Medications:  Medications Prior to Admission  Medication Sig Dispense Refill Last Dose   acetaminophen (TYLENOL) 325 MG tablet Take 325 mg by mouth every 6 (six) hours as needed for mild pain or fever.   unknown   alendronate (FOSAMAX) 70 MG tablet Take 70 mg by mouth once a week. Saturdays   08/19/2022   allopurinol (ZYLOPRIM) 100 MG tablet Take 100 mg by mouth daily.   08/26/2022   ALPRAZolam (XANAX) 0.5 MG tablet Take 1 tablet (0.5 mg total) by mouth at bedtime. 5 tablet 0 08/25/2022   amLODipine (NORVASC) 10 MG tablet TAKE 1 TABLET EVERY DAY (Patient taking differently: Take 10 mg by mouth daily.) 90 tablet 3 08/26/2022   aspirin 81 MG chewable tablet 1 tablet Orally Once a day   08/26/2022  atorvastatin (LIPITOR) 80 MG tablet Take 1 tablet (80 mg total) by mouth daily at 6 PM. (Patient taking differently: Take 80 mg by mouth daily.)   08/25/2022   Cholecalciferol (VITAMIN D3) 50 MCG (2000 UT) capsule Take 2,000 Units by mouth daily with lunch.   08/25/2022   cloNIDine (CATAPRES) 0.2 MG tablet TAKE 1 TABLET TWICE DAILY 180 tablet 0 08/26/2022   Cyanocobalamin (VITAMIN B12) 1000 MCG TBCR Take 1,000 mcg by mouth daily with lunch.   Past Week   famotidine (PEPCID) 20 MG tablet Take 1 tablet (20 mg total) by mouth 2 (two) times daily. 60 tablet 3 10/11/7251   folic acid (FOLVITE) 1 MG tablet Take 1  mg by mouth daily.   Past Week   furosemide (LASIX) 40 MG tablet TAKE 2 TABLETS IN MORNING AND 1 AND 1/2 TABLETS IN EVENING. CHANGE IN DOSAGE. (Patient taking differently: Take 40 mg by mouth See admin instructions. Take 2 tablets in the morning and 1 and 1/2 every evening) 315 tablet 0 08/26/2022   hydrALAZINE (APRESOLINE) 100 MG tablet TAKE 1 TABLET THREE TIMES DAILY (Patient taking differently: Take 100 mg by mouth 3 (three) times daily.) 270 tablet 3 08/26/2022   insulin aspart (NOVOLOG) 100 UNIT/ML injection Take 2 units for blood sugar 200-250, 4 units for blood sugar 252-300, 6 units for blood sugar 301-350, 8 units for blood sugar greater than 350 and call MD. (Patient taking differently: Inject 2-8 Units into the skin 3 (three) times daily with meals. Per sliding scale, If blood sugar is 200 to 250, give 2 units. If blood sugar is 251 to 300, give 4 units. If blood sugar is 301 to 350, give 6 units. If blood sugar is greater than 350, give 8 units and call MD.) 10 mL 11 08/26/2022   Insulin Degludec (TRESIBA) 100 UNIT/ML SOLN Inject 9-14 Units into the skin at bedtime.   Past Week   metoprolol succinate (TOPROL-XL) 25 MG 24 hr tablet TAKE 1/2 TABLET EVERY DAY 45 tablet 0 08/26/2022 at AM   ondansetron (ZOFRAN) 4 MG tablet Take 1 tablet (4 mg total) by mouth every 6 (six) hours as needed for nausea. 20 tablet 0 08/26/2022   ondansetron (ZOFRAN-ODT) 4 MG disintegrating tablet Take 4 mg by mouth every 8 (eight) hours as needed for nausea or vomiting.   UNK   pantoprazole (PROTONIX) 40 MG tablet Take 40 mg by mouth 2 (two) times daily.   08/26/2022   predniSONE (DELTASONE) 5 MG tablet Take 20 mg by mouth daily. Take 1 tablet daily   08/26/2022   saccharomyces boulardii (FLORASTOR) 250 MG capsule Take 250 mg by mouth 2 (two) times daily.   Past Week   simethicone (GAS-X) 80 MG chewable tablet Chew 1 tablet (80 mg total) by mouth 4 (four) times daily as needed for flatulence. 100 tablet 2 unknown    sodium bicarbonate 650 MG tablet Take 650 mg by mouth 2 (two) times daily.   08/26/2022   spironolactone (ALDACTONE) 25 MG tablet TAKE 1 TABLET EVERY DAY (Patient taking differently: Take 25 mg by mouth daily.) 90 tablet 3 08/26/2022   tadalafil, PAH, (ADCIRCA) 20 MG tablet Take 2 tablets (40 mg total) by mouth daily. (Patient taking differently: Take 40 mg by mouth at bedtime.) 60 tablet 11 08/25/2022   vitamin C (ASCORBIC ACID) 500 MG tablet Take 500 mg by mouth daily with lunch.   Past Week   warfarin (COUMADIN) 5 MG tablet TAKE 1 TO 1 AND  1/2 TABLETS DAILY AS DIRECTED BY COUMADIN CLINIC (Patient taking differently: Take 5 mg by mouth See admin instructions. Take 1 tablet ever day except for on Sundays 1 and 1/2 tablets) 135 tablet 10 08/25/2022 at PM   cholestyramine (QUESTRAN) 4 GM/DOSE powder Take 4 g by mouth daily after supper. (Patient not taking: Reported on 08/17/2022)   Not Taking   doxycycline (VIBRA-TABS) 100 MG tablet Take 100 mg by mouth 2 (two) times daily.      HYDROmorphone HCl (DILAUDID) 1 MG/ML LIQD Take 1-2 mLs (1-2 mg total) by mouth every 6 (six) hours as needed for severe pain or moderate pain (use oral medication prior to giving IV). (Patient not taking: Reported on 08/17/2022) 20 mL 0 Completed Course   insulin glargine-yfgn (SEMGLEE) 100 UNIT/ML injection Inject 0.1 mLs (10 Units total) into the skin daily. (Patient taking differently: Inject 0.1 mLs into the skin daily.) 10 mL 11 UNK   Insulin Syringe-Needle U-100 (B-D INS SYR ULTRAFINE .3CC/29G) 29G X 1/2" 0.3 ML MISC Use for sliding scale for breakthrough high blood sugar readings 100 each 1    mirabegron ER (MYRBETRIQ) 25 MG TB24 tablet Take 1 tablet (25 mg total) by mouth daily. (Patient not taking: Reported on 08/17/2022) 10 tablet 0 Not Taking    Assessment: Pharmacy consulted to dose warfarin in patient with atrial fibrillation and mechanical aortic valve.  Patient's INR on admission is 2.1 and home dose listed as  7.5 mg on Sunday and 5 mg ROW.  INR 2.1 >> 1.7> 2.2 > 3.2 > 2.9 > 2.4 Hgb 9.7- stable  Goal of Therapy:  INR 2-3 Monitor platelets by anticoagulation protocol: Yes   Plan:  Warfarin 5 mg x 1 dose. Monitor daily INR and s/s of bleeding  Margot Ables, PharmD Clinical Pharmacist 09/01/2022 7:57 AM

## 2022-09-01 NOTE — Progress Notes (Signed)
Physical Therapy Treatment Patient Details Name: [name] MRN: [id number] DOB: [birth year] Today's Date: 09/01/2022   History of Present Illness [name] is a 81 y.o. female with medical history significant of  history of mechanical aortic valve on chronic Coumadin, ANCA positive vasculitis on prednisone, chronic A-fib, chronic diastolic heart failure, CKD stage IV (baseline creatinine 1.9-2.1), type 2 diabetes mellitus who presents to the emergency department via EMS from home due to 1 week onset of constipation with complaint of abdominal discomfort with nausea which started yesterday.  She has history of ANCA vasculitis and was on prednisone since last March, she was recently taking off the prednisone with subsequent reddening of the chronic lower leg pain.  Per daughter at bedside, the red rash on both legs (right leg was wrapped in Ace bandage) was currently being treated as cellulitis with doxycycline.  Patient does not drink much fluid per daughter at bedside.    PT Comments    Patient demonstrates slow labored movement for sitting up at bedside with c/o abdominal pain/discomfort and nausea, required frequent active assistance for completing BLE exercises while seated at bedside and limited to a few slow labored side steps at bedside before having to sit due to c/o generalized weakness and fatigue.  Patient transferred to [facility] for bowel movement and tolerated sitting up in chair after therapy - nursing staff notified.  Patient will benefit from continued skilled physical therapy in hospital and recommended venue below to increase strength, balance, endurance for safe ADLs and gait.     Recommendations for follow up therapy are one component of a multi-disciplinary discharge planning process, led by the attending physician.  Recommendations may be updated based on patient status, additional functional criteria and insurance authorization.  Follow Up Recommendations  Skilled  nursing-short term rehab (<3 hours/day) Can patient physically be transported by private vehicle: Yes   Assistance Recommended at Discharge    Patient can return home with the following A lot of help with walking and/or transfers;A little help with bathing/dressing/bathroom;Assistance with cooking/housework;Help with stairs or ramp for entrance   Equipment Recommendations  None recommended by PT    Recommendations for Other Services       Precautions / Restrictions Precautions Precautions: Fall Restrictions Weight Bearing Restrictions: No     Mobility  Bed Mobility Overal bed mobility: Needs Assistance Bed Mobility: Supine to Sit     Supine to sit: Min assist, Mod assist     General bed mobility comments: increased time, labored movement    Transfers Overall transfer level: Needs assistance Equipment used: Rolling walker (2 wheels) Transfers: Sit to/from Stand, Bed to chair/wheelchair/BSC Sit to Stand: Min assist   Step pivot transfers: Min assist, Mod assist       General transfer comment: unsteady labored movement, increased abdominal pain    Ambulation/Gait Ambulation/Gait assistance: Min assist, Mod assist Gait Distance (Feet): 5 Feet Assistive device: Rolling walker (2 wheels) Gait Pattern/deviations: Decreased step length - left, Decreased step length - right, Decreased stride length, Trunk flexed Gait velocity: slow     General Gait Details: limitd to a few slow labored side steps at bedside due to c/o nausea and stomach discomfort   Stairs             Wheelchair Mobility    Modified Rankin (Stroke Patients Only)       Balance Overall balance assessment: Needs assistance Sitting-balance support: Feet supported, No upper extremity supported Sitting balance-Leahy Scale: Fair Sitting balance - Comments:  fair/good seated EOB   Standing balance support: Reliant on assistive device for balance, During functional activity, Bilateral upper  extremity supported Standing balance-Leahy Scale: Poor Standing balance comment: fair/poor using RW                            Cognition Arousal/Alertness: Awake/alert Behavior During Therapy: WFL for tasks assessed/performed Overall Cognitive Status: Within Functional Limits for tasks assessed                                          Exercises General Exercises - Lower Extremity Long Arc Quad: Seated, AROM, AAROM, Strengthening, Both, 10 reps Hip Flexion/Marching: Seated, AROM, AAROM, Strengthening, Both, 10 reps Toe Raises: Seated, AROM, AAROM, Strengthening, Both, 10 reps Heel Raises: Seated, AROM, Strengthening, Both, 10 reps, AAROM    General Comments        Pertinent Vitals/Pain Pain Assessment Pain Assessment: Faces Faces Pain Scale: Hurts little more Pain Location: stomach Pain Descriptors / Indicators: Discomfort, Grimacing, Guarding Pain Intervention(s): Limited activity within patient's tolerance, Monitored during session, Repositioned    Home Living                          Prior Function            PT Goals (current goals can now be found in the care plan section) Acute Rehab PT Goals Patient Stated Goal: return home with family to assist PT Goal Formulation: With patient/family Time For Goal Achievement: 09/19/22 Potential to Achieve Goals: Good Progress towards PT goals: Progressing toward goals    Frequency    Min 3X/week      PT Plan Discharge plan needs to be updated    Co-evaluation              AM-PAC PT "6 Clicks" Mobility   Outcome Measure  Help needed turning from your back to your side while in a flat bed without using bedrails?: A Little Help needed moving from lying on your back to sitting on the side of a flat bed without using bedrails?: A Lot Help needed moving to and from a bed to a chair (including a wheelchair)?: A Lot Help needed standing up from a chair using your arms (e.g.,  wheelchair or bedside chair)?: A Little Help needed to walk in hospital room?: A Lot Help needed climbing 3-5 steps with a railing? : A Lot 6 Click Score: 14    End of Session   Activity Tolerance: Patient tolerated treatment well;Patient limited by fatigue;Patient limited by pain Patient left: in chair;with call bell/phone within reach Nurse Communication: Mobility status PT Visit Diagnosis: Unsteadiness on feet (R26.81);Other abnormalities of gait and mobility (R26.89);Muscle weakness (generalized) (M62.81)     Time: 8786-7672 PT Time Calculation (min) (ACUTE ONLY): 30 min  Charges:  $Therapeutic Exercise: 8-22 mins $Therapeutic Activity: 8-22 mins                     12:29 PM, 09/01/22 Lonell Grandchild, MPT Physical Therapist with [facility] [phone] office [phone] mobile phone

## 2022-09-01 NOTE — Care Management Important Message (Deleted)
Important Message  Patient Details  Name: [name] MRN: [id number] Date of Birth: Oct 12, 1940   Medicare Important Message Given:  Other (see comment) (spoke with daughter [name], copy left in room 11/22, no additional copy needed)     Tommy Medal 09/01/2022, 3:32 PM

## 2022-09-01 NOTE — TOC Transition Note (Signed)
Transition of Care [facility]) - CM/SW Discharge Note   Patient Details  Name: [name] MRN: [id number] Date of Birth: Dec 10, 1940  [facility]) CM/SW Contact:  Shade Flood, LCSW Phone Number: 09/01/2022, 9:57 AM   Clinical Narrative:     Per MD, pt medically stable for dc home with [facility] today. Updated Marjory Lies at [facility]. There are no other TOC needs for dc.  Final next level of care: [place] Barriers to Discharge: Barriers Resolved   Patient Goals and CMS Choice Patient states their goals for this hospitalization and ongoing recovery are:: return home   Choice offered to / list presented to : Adult Children  Discharge Placement                       Discharge Plan and Services In-house Referral: Clinical Social Work   Post Acute Care Choice: Resumption of Svcs/PTA Provider                    HH Arranged: PT, OT [place] Agency: [place] Date [place]: 08/30/22 Time [place]: 1694 Representative spoke with at [place]: [place] ([place]) Interventions     Readmission Risk Interventions    08/30/2022   11:42 AM 04/20/2022   12:02 PM  Readmission Risk Prevention Plan  Transportation Screening Complete Complete  Medication Review Press photographer) Complete Complete  PCP or Specialist appointment within 3-5 days of discharge  Complete  HRI or [place] Complete Complete  SW Recovery Care/Counseling Consult Complete Complete  Palliative Care Screening Not Applicable Not [place] Not Applicable Not Applicable

## 2022-09-01 NOTE — Progress Notes (Signed)
Patient with observed anxiety and complaint of nausea this shift. Denies pain. Communicates that she "is hungry and wants to eat but too nauseated most of the time". Medicated to address nausea and anxiety this shift.

## 2022-09-02 DIAGNOSIS — N189 Chronic kidney disease, unspecified: Secondary | ICD-10-CM | POA: Diagnosis not present

## 2022-09-02 DIAGNOSIS — I5033 Acute on chronic diastolic (congestive) heart failure: Secondary | ICD-10-CM | POA: Diagnosis not present

## 2022-09-02 DIAGNOSIS — N179 Acute kidney failure, unspecified: Secondary | ICD-10-CM | POA: Diagnosis not present

## 2022-09-02 LAB — PROTIME-INR
INR: 2.4 — ABNORMAL HIGH (ref 0.8–1.2)
Prothrombin Time: 25.7 seconds — ABNORMAL HIGH (ref 11.4–15.2)

## 2022-09-02 LAB — GLUCOSE, CAPILLARY
Glucose-Capillary: 122 mg/dL — ABNORMAL HIGH (ref 70–99)
Glucose-Capillary: 187 mg/dL — ABNORMAL HIGH (ref 70–99)
Glucose-Capillary: 107 mg/dL — ABNORMAL HIGH (ref 70–99)
Glucose-Capillary: 207 mg/dL — ABNORMAL HIGH (ref 70–99)

## 2022-09-02 MED ORDER — WARFARIN SODIUM 5 MG PO TABS
5.0000 mg | ORAL_TABLET | Freq: Once | ORAL | Status: AC
  Administered 2022-09-02: 5 mg via ORAL
  Filled 2022-09-02: qty 1

## 2022-09-02 NOTE — Progress Notes (Signed)
PROGRESS NOTE     [name], is a 81 y.o. female, DOB - [birth year], JQB:[id number]  Admit date - 08/26/2022   Admitting Physician Courage Denton Brick, MD  Outpatient Primary MD for the patient is [name], [name], MD  LOS - 4  Cc---difficulty voiding     Brief Summary: 81 y.o. female with medical history significant of  history of mechanical aortic valve on chronic Coumadin, ANCA positive vasculitis on prednisone, chronic A-fib, chronic diastolic heart failure, CKD stage IV (baseline creatinine 1.9-2.1), type 2 diabetes mellitus admitted with abd pain/constipation and concerns for AKI on CKD IV 08/29/22--- patient with increasing shortness of breath fatigue and dyspnea on exertion, chest x-ray suggest CHF, BNP 498... Now requiring IV Lasix and close monitoring of renal function given underlying CKD 4     A/p 1)HFpEF--- acute on chronic diastolic dysfunction CHF--echo with EF of 55 to 60% --- patient with increasing shortness of breath fatigue and dyspnea on exertion, chest x-ray suggest CHF, BNP 498.  -O2 sats 90% on room air Treated initially with IV Lasix and close monitoring of renal function given underlying CKD 4 -CXR with some improvement; transitioned back to home oral lasix dosing   Intake/Output Summary (Last 24 hours) at 09/02/2022 1258 Last data filed at 09/02/2022 0900 Gross per 24 hour  Intake 240 ml  Output 750 ml  Net -510 ml   Filed Weights   08/31/22 0531 09/01/22 0500 09/02/22 0545  Weight: 63.2 kg 60.6 kg 60.8 kg    2)Acute kidney injury superimposed on CKD stage IV On admission BUN/creatinine 50/3.0 (baseline creatinine at 1.9-2.1) -Creatinine trending down with hydration (down to 2.30 from 3.0 on admission) Renally adjust medications, avoid nephrotoxic agents/dehydration/hypotension -Voiding difficulties persist as below #3 -monitor renal function on Lasix   3)Acute urinary retention ----over 1 Liter of urine in bladder---voiding difficulties  persist- requiring in and out  -c/n  Flomax...Marland KitchenMarland KitchenMarland Kitchen plan to be discharged home with Foley catheter with outpatient follow-up with urology for urodynamic studies/voiding trial  4)Acute on chronic constipation--- Abdominal improved after BMs with laxatives --On admission CT abdomen and pelvis without acute findings Continue MiraLAX, Senokot   5)Chronic iron deficiency anemia--- patient has history of refractory anemia suspect patient may also have some degree of anemia of CKD -Hgb currently stable close to baseline above 9 Continue ferrous sulfate   6)controlled type 2 DM - last A1c 6.3 reflecting excellent diabetic control PTA -Continue Semglee 10 units and adjust dose accordingly Use Novolog/Humalog Sliding scale insulin with Accu-Cheks/Fingersticks as ordered   CBG (last 3)  Recent Labs    09/01/22 2108 09/02/22 0753 09/02/22 1119  GLUCAP 111* 122* 187*    7) ANCA Vasculitis Rash on legs erupted bilaterally after prednisone taper -Low index of suspicion for superimposed cellulitis -Currently off prednisone -Wound care consult for lower Extremity skin concerns   8) Chronic atrial fibrillation Continue Toprol for rate control ----Stroke prophylaxis with Coumadin -Echo from 05/24/2021 with EF of 55 to 60% with indeterminate diagnostic parameters  9) history of Aortic repair with mechanical valve  -Pharmacy to manage Coumadin therapy -Echo from May 24, 2022 showed- St Jude mechanical aortic valve. Mean gradient 26 mmHg with DI 0.33, EOA 1.00 cm^2. This suggests a degree of prosthetic valve stenosis (prior  mean gradient 21 mmHg). No significant regurgitation.  -Outpatient follow-up with cardiology advised   10)GERD Continue Pepcid, Protonix   11)Essential hypertension Continue Toprol-XL, hydralazine, amlodipine   12)Gout Continue allopurinol  Pneumonia/Acute respiratory distress - bronchodilator treatment  ordered - starting doxycycline 100 mg BID x 3 days course;  completing 11/25  Disposition/Need for in-Hospital Stay- patient unable to be discharged at this time due to urinary difficulties with AKI on CKD requiring in and out catheterization* -Possible discharge home tomorrow if renal function remains stable and respiratory status improves -She will keep indwelling Foley catheter discharge with outpatient urology follow-up for voiding trial  Disposition: The patient is from: Home              Anticipated d/c is to: Pt agreed to SNF rehab; working on  placement               Anticipated d/c date is: 1 day              Patient currently is medically stable to d/c. Awaiting SNF bed placement  Barriers: Not Clinically Stable-   Code Status :  -  Code Status: DNR   Family Communication:     (patient is alert, awake and coherent)  -Discussed with Waymon Budge  [phone] bedside update 11/23  DVT Prophylaxis  :   - SCDs  Place and maintain sequential compression device Start: 08/28/22 1456 Place TED hose Start: 08/28/22 1456 SCDs Start: 08/27/22 0700 warfarin (COUMADIN) tablet 5 mg   Lab Results  Component Value Date   PLT 204 08/30/2022   Inpatient Medications  Scheduled Meds:  allopurinol  100 mg Oral Daily   amLODipine  10 mg Oral Daily   atorvastatin  80 mg Oral q1800   Chlorhexidine Gluconate Cloth  6 each Topical Daily   doxycycline  100 mg Oral Q12H   famotidine  20 mg Oral QHS   feeding supplement (GLUCERNA SHAKE)  237 mL Oral TID BM   ferrous sulfate  325 mg Oral Q breakfast   furosemide  60 mg Oral QPM   furosemide  80 mg Oral Q breakfast   hydrALAZINE  100 mg Oral TID   insulin aspart  0-5 Units Subcutaneous QHS   insulin aspart  0-9 Units Subcutaneous TID WC   insulin glargine-yfgn  10 Units Subcutaneous QHS   metoprolol succinate  25 mg Oral Daily   pantoprazole  40 mg Oral BID   polyethylene glycol  17 g Oral BID   senna-docusate  2 tablet Oral QHS   warfarin  5 mg Oral ONCE-1600   Warfarin - Pharmacist Dosing  Inpatient   Does not apply q1600   Continuous Infusions: PRN Meds:.acetaminophen **OR** acetaminophen, ALPRAZolam, levalbuterol, ondansetron **OR** ondansetron (ZOFRAN) IV, oxyCODONE   Anti-infectives (From admission, onward)    Start     Dose/Rate Route Frequency Ordered Stop   08/31/22 1515  doxycycline (VIBRA-TABS) tablet 100 mg        100 mg Oral Every 12 hours 08/31/22 1422 09/03/22 8938       Subjective: [name] pt c/o of abdominal discomfort; gas and needs to have a bowel movement.    Objective: Vitals:   09/01/22 1613 09/01/22 2105 09/01/22 2340 09/02/22 0545  BP:  (!) 160/117  (!) 160/51  Pulse:  93  96  Resp:  '20 20 20  '$ Temp:  98.6 F (37 C)  98.3 F (36.8 C)  TempSrc:      SpO2: 96% 94%  94%  Weight:    60.8 kg  Height:        Intake/Output Summary (Last 24 hours) at 09/02/2022 1258 Last data filed at 09/02/2022 0900 Gross per 24 hour  Intake 240 ml  Output 750 ml  Net -510 ml   Filed Weights   08/31/22 0531 09/01/22 0500 09/02/22 0545  Weight: 63.2 kg 60.6 kg 60.8 kg   Physical Exam  Gen:- frail, elderly female, Awake Alert, no acute distress HEENT:- [place].AT, No sclera icterus Neck-Supple, no thyroid masses palpated.  Lungs: rare crackles heard in bases but improved overall.   CV- normal S1, S2 sounds.   Abd-  +ve B.Sounds, Abd Soft, No tenderness,  Extremity/Skin:- trace pretibial edema, pedal pulses present pedal edema nearly resolved Psych-affect is flat; oriented x3 Neuro-generalized weakness no new focal deficits, no tremors GU- foley placed, amber urine seen.   Data Reviewed: I have personally reviewed following labs and imaging studies  CBC: Recent Labs  Lab 08/26/22 2234 08/27/22 0700 08/29/22 0401 08/30/22 0335  WBC 6.9 8.7 9.2 9.2  NEUTROABS 4.1  --   --   --   HGB 9.6* 10.1* 9.6* 9.7*  HCT 29.6* 31.6* 30.8* 31.3*  MCV 94.9 95.5 97.5 96.9  PLT 163 201 171 952   Basic Metabolic Panel: Recent Labs  Lab 08/27/22 0700  08/28/22 0757 08/29/22 0401 08/30/22 0335 09/01/22 0429  NA 133* 135 135 135 133*  K 4.2 4.4 4.6 4.8 4.1  CL 102 107 107 106 101  CO2 23 21* 21* 22 23  GLUCOSE 186* 159* 151* 184* 219*  BUN 49* 39* 40* 42* 41*  CREATININE 2.78* 2.24* 2.30* 2.51* 2.20*  CALCIUM 8.6* 8.6* 8.2* 8.0* 8.1*  MG 2.4  --   --   --  2.1  PHOS 3.9 4.0  --   --   --    GFR: Estimated Creatinine Clearance: 15.8 mL/min (A) (by C-G formula based on SCr of 2.2 mg/dL (H)). Liver Function Tests: Recent Labs  Lab 08/26/22 2234 08/27/22 0700 08/28/22 0757  AST 30 30  --   ALT 15 15  --   ALKPHOS 106 112  --   BILITOT 0.8 0.7  --   PROT 5.1* 5.1*  --   ALBUMIN 2.8* 2.8* 2.8*   Radiology Studies: No results found.   Scheduled Meds:  allopurinol  100 mg Oral Daily   amLODipine  10 mg Oral Daily   atorvastatin  80 mg Oral q1800   Chlorhexidine Gluconate Cloth  6 each Topical Daily   doxycycline  100 mg Oral Q12H   famotidine  20 mg Oral QHS   feeding supplement (GLUCERNA SHAKE)  237 mL Oral TID BM   ferrous sulfate  325 mg Oral Q breakfast   furosemide  60 mg Oral QPM   furosemide  80 mg Oral Q breakfast   hydrALAZINE  100 mg Oral TID   insulin aspart  0-5 Units Subcutaneous QHS   insulin aspart  0-9 Units Subcutaneous TID WC   insulin glargine-yfgn  10 Units Subcutaneous QHS   metoprolol succinate  25 mg Oral Daily   pantoprazole  40 mg Oral BID   polyethylene glycol  17 g Oral BID   senna-docusate  2 tablet Oral QHS   warfarin  5 mg Oral ONCE-1600   Warfarin - Pharmacist Dosing Inpatient   Does not apply q1600   Continuous Infusions:   LOS: 4 days   Irwin Brakeman M.D on 09/02/2022 at 12:58 PM  Go to [email or web address] - for contact info  [facility] - Office  [phone]  If 7PM-7AM, please contact night-coverage [email or web address] 09/02/2022, 12:58 PM

## 2022-09-02 NOTE — Progress Notes (Addendum)
ANTICOAGULATION CONSULT NOTE -   Pharmacy Consult for warfarin Indication: atrial fibrillation/mechanical aortic valve  Allergies  Allergen Reactions   Flagyl [Metronidazole] Rash and Other (See Comments)    ALL-OVER BODY RASH   Augmentin [Amoxicillin-Pot Clavulanate] Diarrhea and Nausea And Vomiting   Ciprofloxacin Diarrhea, Nausea Only and Other (See Comments)    Stomach ache   Coreg [Carvedilol] Other (See Comments)    Terrible cramping in the feet and had a lot of bowel movements, but not diarrhea   Diflucan [Fluconazole] Diarrhea   Losartan Swelling and Other (See Comments)    Patient doesn't recall site of swelling   Privigen [Immune Globulin (Human)] Other (See Comments)    Severe/excruciating pain   Sulfamethoxazole-Trimethoprim Diarrhea, Nausea Only and Other (See Comments)    Stomach upset   Verapamil Hives   Zetia [Ezetimibe] Other (See Comments)    Reaction not recalled   Zocor [Simvastatin - High Dose] Other (See Comments)    Reaction not recalled   Gatifloxacin Rash and Other (See Comments)    Redness to skin around eye    Patient Measurements: Height: 5' (152.4 cm) Weight: 60.8 kg (134 lb 0.6 oz) IBW/kg (Calculated) : 45.5 Heparin Dosing Weight:   Vital Signs: Temp: 98.3 F (36.8 C) (11/25 0545) BP: 160/51 (11/25 0545) Pulse Rate: 96 (11/25 0545)  Labs: Recent Labs    08/31/22 0433 09/01/22 0429 09/02/22 0453  LABPROT 30.3* 26.2* 25.7*  INR 2.9* 2.4* 2.4*  CREATININE  --  2.20*  --      Estimated Creatinine Clearance: 15.8 mL/min (A) (by C-G formula based on SCr of 2.2 mg/dL (H)).   Medical History: Past Medical History:  Diagnosis Date   Aortic atherosclerosis ([place]) 01/23/2018   Atrial fibrillation ([facility])    Benign positional vertigo 10/06/2011   Carotid artery disease ([place])    Chemotherapy induced neutropenia ([place]) 07/21/2019   Cholelithiasis 01/23/2018   Cholelithiasis 01/23/2018   Chronic anticoagulation    Colovesical fistula,  ruled out 04/14/2022   Coronary artery disease    status post coronary artery bypass grafting times 07/10/2004   Diabetes mellitus    Hypercholesteremia    Hypertension    Mechanical heart valve present    H. aortic valve replacement at the time of bypass surgery October 2005   Moderate to severe pulmonary hypertension ([place])    Peripheral arterial disease ([place])    history of left common iliac artery PTA and stenting for a chronic total occlusion 08/26/01   S/P cholecystectomy 03/06/2018    Medications:  Medications Prior to Admission  Medication Sig Dispense Refill Last Dose   acetaminophen (TYLENOL) 325 MG tablet Take 325 mg by mouth every 6 (six) hours as needed for mild pain or fever.   unknown   alendronate (FOSAMAX) 70 MG tablet Take 70 mg by mouth once a week. Saturdays   08/19/2022   allopurinol (ZYLOPRIM) 100 MG tablet Take 100 mg by mouth daily.   08/26/2022   ALPRAZolam (XANAX) 0.5 MG tablet Take 1 tablet (0.5 mg total) by mouth at bedtime. 5 tablet 0 08/25/2022   amLODipine (NORVASC) 10 MG tablet TAKE 1 TABLET EVERY DAY (Patient taking differently: Take 10 mg by mouth daily.) 90 tablet 3 08/26/2022   aspirin 81 MG chewable tablet 1 tablet Orally Once a day   08/26/2022   atorvastatin (LIPITOR) 80 MG tablet Take 1 tablet (80 mg total) by mouth daily at 6 PM. (Patient taking differently: Take 80 mg by mouth daily.)  08/25/2022   Cholecalciferol (VITAMIN D3) 50 MCG (2000 UT) capsule Take 2,000 Units by mouth daily with lunch.   08/25/2022   cloNIDine (CATAPRES) 0.2 MG tablet TAKE 1 TABLET TWICE DAILY 180 tablet 0 08/26/2022   Cyanocobalamin (VITAMIN B12) 1000 MCG TBCR Take 1,000 mcg by mouth daily with lunch.   Past Week   famotidine (PEPCID) 20 MG tablet Take 1 tablet (20 mg total) by mouth 2 (two) times daily. 60 tablet 3 81/10/7508   folic acid (FOLVITE) 1 MG tablet Take 1 mg by mouth daily.   Past Week   furosemide (LASIX) 40 MG tablet TAKE 2 TABLETS IN MORNING AND 1 AND 1/2  TABLETS IN EVENING. CHANGE IN DOSAGE. (Patient taking differently: Take 40 mg by mouth See admin instructions. Take 2 tablets in the morning and 1 and 1/2 every evening) 315 tablet 0 08/26/2022   hydrALAZINE (APRESOLINE) 100 MG tablet TAKE 1 TABLET THREE TIMES DAILY (Patient taking differently: Take 100 mg by mouth 3 (three) times daily.) 270 tablet 3 08/26/2022   insulin aspart (NOVOLOG) 100 UNIT/ML injection Take 2 units for blood sugar 200-250, 4 units for blood sugar 252-300, 6 units for blood sugar 301-350, 8 units for blood sugar greater than 350 and call MD. (Patient taking differently: Inject 2-8 Units into the skin 3 (three) times daily with meals. Per sliding scale, If blood sugar is 200 to 250, give 2 units. If blood sugar is 251 to 300, give 4 units. If blood sugar is 301 to 350, give 6 units. If blood sugar is greater than 350, give 8 units and call MD.) 10 mL 11 08/26/2022   Insulin Degludec (TRESIBA) 100 UNIT/ML SOLN Inject 9-14 Units into the skin at bedtime.   Past Week   metoprolol succinate (TOPROL-XL) 25 MG 24 hr tablet TAKE 1/2 TABLET EVERY DAY 45 tablet 0 08/26/2022 at AM   ondansetron (ZOFRAN) 4 MG tablet Take 1 tablet (4 mg total) by mouth every 6 (six) hours as needed for nausea. 20 tablet 0 08/26/2022   ondansetron (ZOFRAN-ODT) 4 MG disintegrating tablet Take 4 mg by mouth every 8 (eight) hours as needed for nausea or vomiting.   UNK   pantoprazole (PROTONIX) 40 MG tablet Take 40 mg by mouth 2 (two) times daily.   08/26/2022   predniSONE (DELTASONE) 5 MG tablet Take 20 mg by mouth daily. Take 1 tablet daily   08/26/2022   saccharomyces boulardii (FLORASTOR) 250 MG capsule Take 250 mg by mouth 2 (two) times daily.   Past Week   simethicone (GAS-X) 80 MG chewable tablet Chew 1 tablet (80 mg total) by mouth 4 (four) times daily as needed for flatulence. 100 tablet 2 unknown   sodium bicarbonate 650 MG tablet Take 650 mg by mouth 2 (two) times daily.   08/26/2022    spironolactone (ALDACTONE) 25 MG tablet TAKE 1 TABLET EVERY DAY (Patient taking differently: Take 25 mg by mouth daily.) 90 tablet 3 08/26/2022   tadalafil, PAH, (ADCIRCA) 20 MG tablet Take 2 tablets (40 mg total) by mouth daily. (Patient taking differently: Take 40 mg by mouth at bedtime.) 60 tablet 11 08/25/2022   vitamin C (ASCORBIC ACID) 500 MG tablet Take 500 mg by mouth daily with lunch.   Past Week   warfarin (COUMADIN) 5 MG tablet TAKE 1 TO 1 AND 1/2 TABLETS DAILY AS DIRECTED BY COUMADIN CLINIC (Patient taking differently: Take 5 mg by mouth See admin instructions. Take 1 tablet ever day except for on Sundays  1 and 1/2 tablets) 135 tablet 10 08/25/2022 at PM   cholestyramine (QUESTRAN) 4 GM/DOSE powder Take 4 g by mouth daily after supper. (Patient not taking: Reported on 08/17/2022)   Not Taking   doxycycline (VIBRA-TABS) 100 MG tablet Take 100 mg by mouth 2 (two) times daily.      HYDROmorphone HCl (DILAUDID) 1 MG/ML LIQD Take 1-2 mLs (1-2 mg total) by mouth every 6 (six) hours as needed for severe pain or moderate pain (use oral medication prior to giving IV). (Patient not taking: Reported on 08/17/2022) 20 mL 0 Completed Course   insulin glargine-yfgn (SEMGLEE) 100 UNIT/ML injection Inject 0.1 mLs (10 Units total) into the skin daily. (Patient taking differently: Inject 0.1 mLs into the skin daily.) 10 mL 11 UNK   Insulin Syringe-Needle U-100 (B-D INS SYR ULTRAFINE .3CC/29G) 29G X 1/2" 0.3 ML MISC Use for sliding scale for breakthrough high blood sugar readings 100 each 1    mirabegron ER (MYRBETRIQ) 25 MG TB24 tablet Take 1 tablet (25 mg total) by mouth daily. (Patient not taking: Reported on 08/17/2022) 10 tablet 0 Not Taking    Assessment: Pharmacy consulted to dose warfarin in patient with atrial fibrillation and mechanical aortic valve.  Patient's INR on admission is 2.1 and home dose listed as 7.5 mg on Sunday and 5 mg ROW.  INR 2.1 >> 1.7> 2.2 > 3.2 > 2.9 > 2.4> 2.4 Hgb 9.7-  stable  Goal of Therapy:  INR 2-3 Monitor platelets by anticoagulation protocol: Yes   Plan:  Warfarin 5 mg x 1 dose. Monitor daily INR and s/s of bleeding  Isac Sarna, BS Vena [place], BCPS Clinical Pharmacist 09/02/2022 10:24 AM

## 2022-09-02 NOTE — Progress Notes (Signed)
Pt reporting epigastric type pain at start of shift.  States she is having reg Bms, had 2 during day, and refused miralax.  Also refused nutritional supplement.  Gave scheduled pepcid, protonix and prn oxycodone and xanax for comfort as pain was triggering anxiety.  Pt able to rest comfortably during noc.  Foley patent.  Amber urine output.  AFIB on monitor.

## 2022-09-03 DIAGNOSIS — N179 Acute kidney failure, unspecified: Secondary | ICD-10-CM | POA: Diagnosis not present

## 2022-09-03 DIAGNOSIS — N189 Chronic kidney disease, unspecified: Secondary | ICD-10-CM | POA: Diagnosis not present

## 2022-09-03 DIAGNOSIS — I5033 Acute on chronic diastolic (congestive) heart failure: Secondary | ICD-10-CM | POA: Diagnosis not present

## 2022-09-03 LAB — GLUCOSE, CAPILLARY
Glucose-Capillary: 90 mg/dL (ref 70–99)
Glucose-Capillary: 167 mg/dL — ABNORMAL HIGH (ref 70–99)
Glucose-Capillary: 92 mg/dL (ref 70–99)
Glucose-Capillary: 138 mg/dL — ABNORMAL HIGH (ref 70–99)

## 2022-09-03 LAB — BASIC METABOLIC PANEL
Anion gap: 11 (ref 5–15)
BUN: 42 mg/dL — ABNORMAL HIGH (ref 8–23)
CO2: 24 mmol/L (ref 22–32)
Calcium: 8.6 mg/dL — ABNORMAL LOW (ref 8.9–10.3)
Chloride: 101 mmol/L (ref 98–111)
Creatinine, Ser: 2.27 mg/dL — ABNORMAL HIGH (ref 0.44–1.00)
GFR, Estimated: 21 mL/min — ABNORMAL LOW (ref >60)
Glucose, Bld: 89 mg/dL (ref 70–99)
Potassium: 3.5 mmol/L (ref 3.5–5.1)
Sodium: 136 mmol/L (ref 135–145)

## 2022-09-03 LAB — PROTIME-INR
INR: 3 — ABNORMAL HIGH (ref 0.8–1.2)
Prothrombin Time: 31.1 seconds — ABNORMAL HIGH (ref 11.4–15.2)

## 2022-09-03 MED ORDER — WARFARIN SODIUM 2 MG PO TABS
2.0000 mg | ORAL_TABLET | Freq: Once | ORAL | Status: AC
  Administered 2022-09-03: 2 mg via ORAL
  Filled 2022-09-03: qty 1

## 2022-09-03 NOTE — Progress Notes (Signed)
Patient states she feels very fatigued today and doesn't want to do a lot more than just sleeping. Encouraged pt to push fluids and offered help to sit up in chair for a while patient refused. Lungs clear. Hyperactive bowels. Complains with fatigue. Not much of an appetite. Last BM stated by patient was 09/02/2022. Will continue to monitor patient. Call light within reach. Bed in lowest position.

## 2022-09-03 NOTE — Progress Notes (Signed)
ANTICOAGULATION CONSULT NOTE -   Pharmacy Consult for warfarin Indication: atrial fibrillation/mechanical aortic valve    Patient Measurements: Height: 5' (152.4 cm) Weight: 59.1 kg (130 lb 4.7 oz) IBW/kg (Calculated) : 45.5 Heparin Dosing Weight:   Vital Signs: Temp: 98 F (36.7 C) (11/26 0518) BP: 154/67 (11/26 0518) Pulse Rate: 92 (11/26 0518)  Labs: Recent Labs    09/01/22 0429 09/02/22 0453 09/03/22 0504  LABPROT 26.2* 25.7* 31.1*  INR 2.4* 2.4* 3.0*  CREATININE 2.20*  --  2.27*    Estimated Creatinine Clearance: 15.1 mL/min (A) (by C-G formula based on SCr of 2.27 mg/dL (H)).  Assessment: Pharmacy consulted to dose warfarin in patient with atrial fibrillation and mechanical aortic valve.  Patient's INR on admission is 2.1 and home dose listed as 7.5 mg on Sunday and 5 mg ROW.  Patient requiring lower doses during this acute illness and reduced po intake. INR upper limit of normal with a moderate jump.  Goal of Therapy:  INR 2-3 Monitor platelets by anticoagulation protocol: Yes   Plan:  Warfarin '2mg'$  po x 1 Monitor daily INR and s/s of bleeding  Lorenso Courier, PharmD Clinical Pharmacist 09/03/2022 10:37 AM

## 2022-09-03 NOTE — Progress Notes (Signed)
Pt daughter present in evening, able to get pt to eat some soup and cookies.  Pt continuing to have epigastric pain and nausea, we discussed disease related vs ABX.  Pt given xanax and oxycodone for comfort along with prn zofran.  Pt able to rest during noc.  No emesis.  Afib on monitor.  Foley patent.  Able to turn self in bed.  Prn neb x 1 per RT for wheezing, pt reports relief after treatment.

## 2022-09-03 NOTE — Progress Notes (Signed)
PROGRESS NOTE     [name], is a 81 y.o. female, DOB - 03-08-1941, VWU:[id number]  Admit date - 08/26/2022   Admitting Physician Courage Denton Brick, MD  Outpatient Primary MD for the patient is [name], [name], MD  LOS - 5  Cc---difficulty voiding     Brief Summary: 81 y.o. female with medical history significant of  history of mechanical aortic valve on chronic Coumadin, ANCA positive vasculitis on prednisone, chronic A-fib, chronic diastolic heart failure, CKD stage IV (baseline creatinine 1.9-2.1), type 2 diabetes mellitus admitted with abd pain/constipation and concerns for AKI on CKD IV 08/29/22--- patient with increasing shortness of breath fatigue and dyspnea on exertion, chest x-ray suggest CHF, BNP 498... Now requiring IV Lasix and close monitoring of renal function given underlying CKD 4     A/p 1)HFpEF--- acute on chronic diastolic dysfunction CHF--echo with EF of 55 to 60% --- patient with increasing shortness of breath fatigue and dyspnea on exertion, chest x-ray suggest CHF, BNP 498.  -O2 sats 90% on room air Treated initially with IV Lasix and close monitoring of renal function given underlying CKD 4 -CXR with some improvement; transitioned back to home oral lasix dosing   Intake/Output Summary (Last 24 hours) at 09/03/2022 1435 Last data filed at 09/03/2022 0500 Gross per 24 hour  Intake 1840 ml  Output 1100 ml  Net 740 ml   Filed Weights   09/01/22 0500 09/02/22 0545 09/03/22 0518  Weight: 60.6 kg 60.8 kg 59.1 kg    2)Acute kidney injury superimposed on CKD stage IV On admission BUN/creatinine 50/3.0 (baseline creatinine at 1.9-2.1) -Creatinine trending down with hydration (down to 2.30 from 3.0 on admission) Renally adjust medications, avoid nephrotoxic agents/dehydration/hypotension -Voiding difficulties persist as below #3 -monitoring renal function on Lasix -seems to be rapidly approaching stage V and having some symptoms of uremia  (nausea) -doubt patient would ever be a candidate for renal replacement therapy given advanced age and frailty   3)Acute urinary retention ----over 1 Liter of urine in bladder---voiding difficulties persist- requiring in and out  -c/n  Flomax...Marland KitchenMarland KitchenMarland Kitchen plan to be discharged home with Foley catheter with outpatient follow-up with urology for urodynamic studies/voiding trial  4)Acute on chronic constipation--- Abdominal improved after BMs with laxatives --On admission CT abdomen and pelvis without acute findings Continue MiraLAX, Senokot   5)Chronic iron deficiency anemia--- patient has history of refractory anemia suspect patient may also have some degree of anemia of CKD -Hgb currently stable close to baseline above 9 Continue ferrous sulfate   6)controlled type 2 DM - last A1c 6.3 reflecting excellent diabetic control PTA -Continue Semglee 10 units and adjust dose accordingly Use Novolog/Humalog Sliding scale insulin with Accu-Cheks/Fingersticks as ordered   CBG (last 3)  Recent Labs    09/02/22 2140 09/03/22 0735 09/03/22 1207  GLUCAP 207* 90 167*    7) ANCA Vasculitis Rash on legs erupted bilaterally after prednisone taper -Low index of suspicion for superimposed cellulitis -Currently off prednisone -Wound care consult for lower Extremity skin concerns   8) Chronic atrial fibrillation Continue Toprol for rate control ----Stroke prophylaxis with Coumadin -Echo from 05/24/2021 with EF of 55 to 60% with indeterminate diagnostic parameters  9) history of Aortic repair with mechanical valve  -Pharmacy to manage Coumadin therapy -Echo from May 24, 2022 showed- St Jude mechanical aortic valve. Mean gradient 26 mmHg with DI 0.33, EOA 1.00 cm^2. This suggests a degree of prosthetic valve stenosis (prior  mean gradient 21 mmHg). No significant regurgitation.  -Outpatient  follow-up with cardiology advised   10)GERD Continue Pepcid, Protonix   11)Essential hypertension Continue  Toprol-XL, hydralazine, amlodipine   12)Gout Continue allopurinol  Pneumonia/Acute respiratory distress - bronchodilator treatment ordered - doxycycline 100 mg BID x 3 days course; completing 11/25  Disposition/Need for in-Hospital Stay- patient unable to be discharged at this time due to urinary difficulties with AKI on CKD requiring in and out catheterization* -Possible discharge home tomorrow if renal function remains stable and respiratory status improves -She will keep indwelling Foley catheter discharge with outpatient urology follow-up for voiding trial  Disposition: The patient is from: Home              Anticipated d/c is to: Pt agreed to SNF rehab; working on  placement               Anticipated d/c date is: 1 day              Patient currently is medically stable to d/c. Awaiting SNF bed placement  Barriers: SNF placement   Code Status :  -  Code Status: DNR   Family Communication:     (patient is alert, awake and coherent)  -Discussed with Waymon Budge  [phone] bedside update 11/23  DVT Prophylaxis  :   - SCDs  Place and maintain sequential compression device Start: 08/28/22 1456 Place TED hose Start: 08/28/22 1456 SCDs Start: 08/27/22 0700 warfarin (COUMADIN) tablet 2 mg   Lab Results  Component Value Date   PLT 204 08/30/2022   Inpatient Medications  Scheduled Meds:  allopurinol  100 mg Oral Daily   amLODipine  10 mg Oral Daily   atorvastatin  80 mg Oral q1800   Chlorhexidine Gluconate Cloth  6 each Topical Daily   famotidine  20 mg Oral QHS   feeding supplement (GLUCERNA SHAKE)  237 mL Oral TID BM   ferrous sulfate  325 mg Oral Q breakfast   furosemide  60 mg Oral QPM   furosemide  80 mg Oral Q breakfast   hydrALAZINE  100 mg Oral TID   insulin aspart  0-5 Units Subcutaneous QHS   insulin aspart  0-9 Units Subcutaneous TID WC   insulin glargine-yfgn  10 Units Subcutaneous QHS   metoprolol succinate  25 mg Oral Daily   pantoprazole  40 mg Oral BID    polyethylene glycol  17 g Oral BID   senna-docusate  2 tablet Oral QHS   warfarin  2 mg Oral ONCE-1600   Warfarin - Pharmacist Dosing Inpatient   Does not apply q1600   Continuous Infusions: PRN Meds:.acetaminophen **OR** acetaminophen, ALPRAZolam, levalbuterol, ondansetron **OR** ondansetron (ZOFRAN) IV, oxyCODONE   Anti-infectives (From admission, onward)    Start     Dose/Rate Route Frequency Ordered Stop   08/31/22 1515  doxycycline (VIBRA-TABS) tablet 100 mg        100 mg Oral Every 12 hours 08/31/22 1422 09/02/22 2137       Subjective: [name] pt c/o of eating better and less abd discomfort after having bowel movement     Objective: Vitals:   09/02/22 1651 09/02/22 2055 09/02/22 2137 09/03/22 0518  BP: (!) 159/67  (!) 149/75 (!) 154/67  Pulse: 95  (!) 102 92  Resp: '20  20 19  '$ Temp: 98.9 F (37.2 C)  98 F (36.7 C) 98 F (36.7 C)  TempSrc: Oral  Oral   SpO2: 95% 95% 99% 93%  Weight:    59.1 kg  Height:  Intake/Output Summary (Last 24 hours) at 09/03/2022 1435 Last data filed at 09/03/2022 0500 Gross per 24 hour  Intake 1840 ml  Output 1100 ml  Net 740 ml   Filed Weights   09/01/22 0500 09/02/22 0545 09/03/22 0518  Weight: 60.6 kg 60.8 kg 59.1 kg   Physical Exam  Gen:- frail, elderly female, Awake Alert, no acute distress HEENT:- [place].AT, No sclera icterus Neck-Supple, no thyroid masses palpated.  Lungs: rare crackles heard in bases but improved overall.   CV- normal S1, S2 sounds.   Abd-  +ve B.Sounds, Abd Soft, No tenderness,  Extremity/Skin:- trace pretibial edema, pedal pulses present pedal edema nearly resolved Psych-affect is flat; oriented x3 Neuro-generalized weakness no new focal deficits, no tremors GU- foley placed, amber urine seen.   Data Reviewed: I have personally reviewed following labs and imaging studies  CBC: Recent Labs  Lab 08/29/22 0401 08/30/22 0335  WBC 9.2 9.2  HGB 9.6* 9.7*  HCT 30.8* 31.3*  MCV 97.5 96.9   PLT 171 397   Basic Metabolic Panel: Recent Labs  Lab 08/28/22 0757 08/29/22 0401 08/30/22 0335 09/01/22 0429 09/03/22 0504  NA 135 135 135 133* 136  K 4.4 4.6 4.8 4.1 3.5  CL 107 107 106 101 101  CO2 21* 21* '22 23 24  '$ GLUCOSE 159* 151* 184* 219* 89  BUN 39* 40* 42* 41* 42*  CREATININE 2.24* 2.30* 2.51* 2.20* 2.27*  CALCIUM 8.6* 8.2* 8.0* 8.1* 8.6*  MG  --   --   --  2.1  --   PHOS 4.0  --   --   --   --    GFR: Estimated Creatinine Clearance: 15.1 mL/min (A) (by C-G formula based on SCr of 2.27 mg/dL (H)). Liver Function Tests: Recent Labs  Lab 08/28/22 0757  ALBUMIN 2.8*   Radiology Studies: No results found.   Scheduled Meds:  allopurinol  100 mg Oral Daily   amLODipine  10 mg Oral Daily   atorvastatin  80 mg Oral q1800   Chlorhexidine Gluconate Cloth  6 each Topical Daily   famotidine  20 mg Oral QHS   feeding supplement (GLUCERNA SHAKE)  237 mL Oral TID BM   ferrous sulfate  325 mg Oral Q breakfast   furosemide  60 mg Oral QPM   furosemide  80 mg Oral Q breakfast   hydrALAZINE  100 mg Oral TID   insulin aspart  0-5 Units Subcutaneous QHS   insulin aspart  0-9 Units Subcutaneous TID WC   insulin glargine-yfgn  10 Units Subcutaneous QHS   metoprolol succinate  25 mg Oral Daily   pantoprazole  40 mg Oral BID   polyethylene glycol  17 g Oral BID   senna-docusate  2 tablet Oral QHS   warfarin  2 mg Oral ONCE-1600   Warfarin - Pharmacist Dosing Inpatient   Does not apply q1600   Continuous Infusions:   LOS: 5 days   Irwin Brakeman M.D on 09/03/2022 at 2:35 PM  Go to [email or web address] - for contact info  [facility] - Office  [phone]  If 7PM-7AM, please contact night-coverage [email or web address] 09/03/2022, 2:35 PM

## 2022-09-04 DIAGNOSIS — R338 Other retention of urine: Secondary | ICD-10-CM | POA: Diagnosis not present

## 2022-09-04 DIAGNOSIS — N189 Chronic kidney disease, unspecified: Secondary | ICD-10-CM | POA: Diagnosis not present

## 2022-09-04 DIAGNOSIS — N179 Acute kidney failure, unspecified: Secondary | ICD-10-CM | POA: Diagnosis not present

## 2022-09-04 DIAGNOSIS — I5033 Acute on chronic diastolic (congestive) heart failure: Secondary | ICD-10-CM | POA: Diagnosis not present

## 2022-09-04 LAB — BASIC METABOLIC PANEL
Anion gap: 9 (ref 5–15)
BUN: 47 mg/dL — ABNORMAL HIGH (ref 8–23)
CO2: 24 mmol/L (ref 22–32)
Calcium: 8.5 mg/dL — ABNORMAL LOW (ref 8.9–10.3)
Chloride: 101 mmol/L (ref 98–111)
Creatinine, Ser: 2.5 mg/dL — ABNORMAL HIGH (ref 0.44–1.00)
GFR, Estimated: 19 mL/min — ABNORMAL LOW (ref >60)
Glucose, Bld: 105 mg/dL — ABNORMAL HIGH (ref 70–99)
Potassium: 3.3 mmol/L — ABNORMAL LOW (ref 3.5–5.1)
Sodium: 134 mmol/L — ABNORMAL LOW (ref 135–145)

## 2022-09-04 LAB — GLUCOSE, CAPILLARY
Glucose-Capillary: 101 mg/dL — ABNORMAL HIGH (ref 70–99)
Glucose-Capillary: 155 mg/dL — ABNORMAL HIGH (ref 70–99)
Glucose-Capillary: 85 mg/dL (ref 70–99)
Glucose-Capillary: 176 mg/dL — ABNORMAL HIGH (ref 70–99)

## 2022-09-04 LAB — PROTIME-INR
INR: 3.3 — ABNORMAL HIGH (ref 0.8–1.2)
Prothrombin Time: 32.9 seconds — ABNORMAL HIGH (ref 11.4–15.2)

## 2022-09-04 LAB — MAGNESIUM: Magnesium: 1.8 mg/dL (ref 1.7–2.4)

## 2022-09-04 NOTE — Consult Note (Signed)
Reason for Consult: AKI/CKD stage IV Referring Physician:  Wynetta Emery, MD  [name] is an 81 y.o. female with a PMH significant for CAD s/p CABG, s/p mechanical AVF, PAD, pulmonary HTN, chronic combined systolic and diastolic CHF, HTN, gout, P-ANCA vasculitis (diagnosed 2020 and treated with rituximab and cellcept by Oncology), autoimmune hemolytic anemia, atrial fibrillation, and CKD stage IV who presented to [facility] ED via EMS on 08/26/22 with a 1 week history of constipation and abdominal pain.  In the ED VSS, labs notable for Na 133, BUN 50, Cr 3.  CT of abdomen and pelvis without abnormality.  She also had nausea and was treated with zofran and IVF's.  Her Scr improved to 2.2.   Pt developed increasing SOB and DOE on 08/29/22, CXR suggested CHF, BNP 498.  IVF's were discontinued and started on IV lasix.  We were consulted after she developed worsening renal function over the past 24 hours.  She is normally followed by Dr. [name] in our office but was recently seen by Nephrology at [facility].  The trend in Scr is seen below.   Her baseline Scr has been 2.1-2.6 with multiple episodes of AKI/CKD stage IV over the past year.  She has had multiple hospitalizations for a variety of causes, mainly infection or anemia.  She has never had a kidney biopsy as she was already being treated with cellcept and rituximab for her P-ANCA vasculitis by Oncology.   UA from 08/28/22 was negative for blood but + for protein.  She denies any N/V/D, dysuria, pyuria, hematuria, but did have urinary retention and currently has foley catheter in place.  Trend in Creatinine: Creatinine, Ser  Date/Time Value Ref Range Status  09/04/2022 05:54 AM 2.50 (H) 0.44 - 1.00 mg/dL Final  09/03/2022 05:04 AM 2.27 (H) 0.44 - 1.00 mg/dL Final  09/01/2022 04:29 AM 2.20 (H) 0.44 - 1.00 mg/dL Final  08/30/2022 03:35 AM 2.51 (H) 0.44 - 1.00 mg/dL Final  08/29/2022 04:01 AM 2.30 (H) 0.44 - 1.00 mg/dL Final  08/28/2022 07:57 AM 2.24 (H) 0.44 -  1.00 mg/dL Final  08/27/2022 07:00 AM 2.78 (H) 0.44 - 1.00 mg/dL Final  08/26/2022 10:34 PM 3.00 (H) 0.44 - 1.00 mg/dL Final  08/17/2022 11:34 AM 2.56 (H) 0.44 - 1.00 mg/dL Final  08/07/2022 12:21 PM 2.02 (H) 0.44 - 1.00 mg/dL Final  08/04/2022 10:27 AM 2.11 (H) 0.44 - 1.00 mg/dL Final  08/01/2022 04:01 PM 1.93 (H) 0.44 - 1.00 mg/dL Final  07/26/2022 01:30 PM 1.97 (H) 0.44 - 1.00 mg/dL Final  07/14/2022 04:25 AM 2.44 (H) 0.44 - 1.00 mg/dL Final  07/13/2022 04:34 AM 2.78 (H) 0.44 - 1.00 mg/dL Final  07/12/2022 04:33 AM 2.95 (H) 0.44 - 1.00 mg/dL Final  07/11/2022 04:10 AM 3.29 (H) 0.44 - 1.00 mg/dL Final  07/10/2022 06:12 AM 3.41 (H) 0.44 - 1.00 mg/dL Final  07/09/2022 04:46 AM 2.69 (H) 0.44 - 1.00 mg/dL Final  07/08/2022 07:32 PM 2.77 (H) 0.44 - 1.00 mg/dL Final  06/25/2022 03:53 AM 2.16 (H) 0.44 - 1.00 mg/dL Final  06/24/2022 04:24 AM 2.23 (H) 0.44 - 1.00 mg/dL Final  06/23/2022 04:16 AM 2.16 (H) 0.44 - 1.00 mg/dL Final  06/22/2022 09:55 AM 2.18 (H) 0.44 - 1.00 mg/dL Final  04/30/2022 05:37 AM 1.97 (H) 0.44 - 1.00 mg/dL Final  04/29/2022 06:12 AM 2.11 (H) 0.44 - 1.00 mg/dL Final  04/28/2022 04:31 AM 2.07 (H) 0.44 - 1.00 mg/dL Final  04/27/2022 05:46 AM 2.18 (H) 0.44 - 1.00 mg/dL Final  04/26/2022 05:45 AM 2.10 (H) 0.44 - 1.00 mg/dL Final  04/25/2022 05:47 AM 2.12 (H) 0.44 - 1.00 mg/dL Final  04/24/2022 05:03 AM 2.24 (H) 0.44 - 1.00 mg/dL Final  04/23/2022 08:31 AM 2.10 (H) 0.44 - 1.00 mg/dL Final  04/22/2022 05:30 AM 2.31 (H) 0.44 - 1.00 mg/dL Final  04/21/2022 05:34 AM 2.49 (H) 0.44 - 1.00 mg/dL Final  04/20/2022 05:37 AM 2.36 (H) 0.44 - 1.00 mg/dL Final  04/19/2022 08:59 AM 2.19 (H) 0.44 - 1.00 mg/dL Final  04/18/2022 12:16 PM 2.56 (H) 0.44 - 1.00 mg/dL Final  04/17/2022 12:41 PM 2.40 (H) 0.44 - 1.00 mg/dL Final  04/16/2022 03:43 AM 2.65 (H) 0.44 - 1.00 mg/dL Final  04/15/2022 09:22 AM 2.66 (H) 0.44 - 1.00 mg/dL Final  04/14/2022 04:25 AM 2.62 (H) 0.44 - 1.00 mg/dL Final   04/13/2022 05:34 AM 2.51 (H) 0.44 - 1.00 mg/dL Final  04/12/2022 11:40 AM 2.62 (H) 0.44 - 1.00 mg/dL Final  02/10/2022 04:25 AM 2.45 (H) 0.44 - 1.00 mg/dL Final  02/09/2022 02:38 AM 2.86 (H) 0.44 - 1.00 mg/dL Final  02/08/2022 06:02 AM 2.48 (H) 0.44 - 1.00 mg/dL Final  02/07/2022 04:22 AM 2.79 (H) 0.44 - 1.00 mg/dL Final  02/06/2022 06:27 AM 2.75 (H) 0.44 - 1.00 mg/dL Final  02/05/2022 07:41 AM 3.00 (H) 0.44 - 1.00 mg/dL Final  02/04/2022 04:49 PM 3.10 (H) 0.44 - 1.00 mg/dL Final  02/02/2022 10:29 AM 2.58 (H) 0.44 - 1.00 mg/dL Final  01/10/2022 06:22 PM 2.25 (H) 0.44 - 1.00 mg/dL Final   Creatinine  Date/Time Value Ref Range Status  08/18/2022 09:18 AM 2.44 (H) 0.44 - 1.00 mg/dL Final  08/11/2022 10:04 AM 1.93 (H) 0.44 - 1.00 mg/dL Final  07/21/2022 10:56 AM 2.05 (H) 0.44 - 1.00 mg/dL Final  07/07/2022 12:06 PM 2.21 (H) 0.44 - 1.00 mg/dL Final  06/29/2022 11:50 AM 2.33 (H) 0.44 - 1.00 mg/dL Final  06/15/2022 10:49 AM 1.90 (H) 0.44 - 1.00 mg/dL Final  06/08/2022 12:27 PM 2.39 (H) 0.44 - 1.00 mg/dL Final  06/02/2022 12:39 PM 2.42 (H) 0.44 - 1.00 mg/dL Final  05/25/2022 11:31 AM 2.34 (H) 0.44 - 1.00 mg/dL Final  05/18/2022 09:42 AM 2.51 (H) 0.44 - 1.00 mg/dL Final  05/15/2022 10:55 AM 2.41 (H) 0.44 - 1.00 mg/dL Final  05/12/2022 12:37 PM 2.66 (H) 0.44 - 1.00 mg/dL Final  05/05/2022 11:22 AM 2.34 (H) 0.44 - 1.00 mg/dL Final  04/10/2022 01:10 PM 2.55 (H) 0.44 - 1.00 mg/dL Final  04/07/2022 11:27 AM 2.48 (H) 0.44 - 1.00 mg/dL Final  04/04/2022 01:37 PM 2.55 (H) 0.44 - 1.00 mg/dL Final  03/31/2022 11:09 AM 2.41 (H) 0.44 - 1.00 mg/dL Final  03/28/2022 12:00 PM 2.53 (H) 0.44 - 1.00 mg/dL Final  03/24/2022 10:08 AM 2.48 (H) 0.44 - 1.00 mg/dL Final  03/21/2022 12:45 PM 2.42 (H) 0.44 - 1.00 mg/dL Final  03/17/2022 09:23 AM 2.79 (H) 0.44 - 1.00 mg/dL Final  03/13/2022 10:42 AM 2.32 (H) 0.44 - 1.00 mg/dL Final  03/10/2022 11:34 AM 2.53 (H) 0.44 - 1.00 mg/dL Final  03/07/2022 01:21 PM 2.49  (H) 0.44 - 1.00 mg/dL Final  03/03/2022 11:41 AM 2.33 (H) 0.44 - 1.00 mg/dL Final  02/27/2022 10:58 AM 2.93 (H) 0.44 - 1.00 mg/dL Final  02/23/2022 02:30 PM 2.63 (H) 0.44 - 1.00 mg/dL Final  02/20/2022 09:05 AM 2.65 (H) 0.44 - 1.00 mg/dL Final  02/17/2022 08:20 AM 2.35 (H) 0.44 - 1.00 mg/dL Final  02/13/2022 01:30 PM 2.42 (H) 0.44 -  1.00 mg/dL Final  01/30/2022 08:20 AM 2.43 (H) 0.44 - 1.00 mg/dL Final  01/24/2022 11:01 AM 2.46 (H) 0.44 - 1.00 mg/dL Final  01/19/2022 10:25 AM 2.22 (H) 0.44 - 1.00 mg/dL Final  01/16/2022 10:35 AM 2.48 (H) 0.44 - 1.00 mg/dL Final  01/12/2022 12:36 PM 2.35 (H) 0.44 - 1.00 mg/dL Final  01/10/2022 03:31 PM 2.41 (H) 0.44 - 1.00 mg/dL Final  01/09/2022 08:51 AM 2.26 (H) 0.44 - 1.00 mg/dL Final  01/02/2022 08:54 AM 2.29 (H) 0.44 - 1.00 mg/dL Final  12/26/2021 08:33 AM 2.54 (H) 0.44 - 1.00 mg/dL Final  12/13/2021 12:02 PM 2.78 (H) 0.44 - 1.00 mg/dL Final  05/24/2021 02:14 PM 2.50 (H) 0.44 - 1.00 mg/dL Final  03/30/2020 09:32 AM 1.73 (H) 0.44 - 1.00 mg/dL Final  03/01/2020 11:06 AM 2.66 (H) 0.44 - 1.00 mg/dL Final  02/02/2020 10:26 AM 1.85 (H) 0.44 - 1.00 mg/dL Final  01/05/2020 11:02 AM 1.80 (H) 0.44 - 1.00 mg/dL Final  12/08/2019 10:33 AM 1.78 (H) 0.44 - 1.00 mg/dL Final  11/10/2019 11:26 AM 1.79 (H) 0.44 - 1.00 mg/dL Final  10/28/2019 11:24 AM 1.69 (H) 0.44 - 1.00 mg/dL Final  10/13/2019 10:30 AM 1.53 (H) 0.44 - 1.00 mg/dL Final  09/15/2019 10:23 AM 1.98 (H) 0.44 - 1.00 mg/dL Final  09/01/2019 09:16 AM 1.64 (H) 0.44 - 1.00 mg/dL Final  08/18/2019 10:41 AM 1.68 (H) 0.44 - 1.00 mg/dL Final    PMH:   Past Medical History:  Diagnosis Date   Aortic atherosclerosis ([place]) 01/23/2018   Atrial fibrillation ([facility])    Benign positional vertigo 10/06/2011   Carotid artery disease ([place])    Chemotherapy induced neutropenia ([place]) 07/21/2019   Cholelithiasis 01/23/2018   Cholelithiasis 01/23/2018   Chronic anticoagulation    Colovesical fistula, ruled out 04/14/2022    Coronary artery disease    status post coronary artery bypass grafting times 07/10/2004   Diabetes mellitus    Hypercholesteremia    Hypertension    Mechanical heart valve present    H. aortic valve replacement at the time of bypass surgery October 2005   Moderate to severe pulmonary hypertension ([place])    Peripheral arterial disease ([place])    history of left common iliac artery PTA and stenting for a chronic total occlusion 08/26/01   S/P cholecystectomy 03/06/2018    PSH:   Past Surgical History:  Procedure Laterality Date   anterior repair  2009   AORTIC VALVE REPLACEMENT  2005   Dr. Cyndia Bent   CARDIAC CATHETERIZATION  11/10/2004   40% right common illiac, 70% in stent restenosis of distal left common illiac,    CARDIAC CATHETERIZATION  05/18/2004   LAD 50-70% midstenosis, RCA dominant w/50% stenosis, 50% Right common Illiac artery ostial stenosis, 90% in stent restenosis within midportion of left common illiac stent   Carotid Duplex  03/12/2012   RSA-elev. velocities suggestive of a 50-69% diameter reduction, Right&Left Bulb/Prox ICA-mild-mod.fibrous plaqueelevating Velocities abnormal study.   CHOLECYSTECTOMY N/A 03/01/2018   Procedure: LAPAROSCOPIC CHOLECYSTECTOMY;  Surgeon: Judeth Horn, MD;  Location: [name];  Service: General;  Laterality: N/A;   COLONOSCOPY WITH PROPOFOL N/A 01/22/2018   Procedure: COLONOSCOPY WITH PROPOFOL;  Surgeon: Wilford Corner, MD;  Location: [place];  Service: Endoscopy;  Laterality: N/A;   ESOPHAGOGASTRODUODENOSCOPY N/A 07/13/2022   Procedure: ESOPHAGOGASTRODUODENOSCOPY (EGD);  Surgeon: [name], MD;  Location: [facility];  Service: Gastroenterology;  Laterality: N/A;   ESOPHAGOGASTRODUODENOSCOPY (EGD) WITH PROPOFOL N/A 01/22/2018   Procedure: ESOPHAGOGASTRODUODENOSCOPY (EGD) WITH PROPOFOL;  Surgeon: Wilford Corner, MD;  Location: [place];  Service: Endoscopy;  Laterality: N/A;   ESOPHAGOGASTRODUODENOSCOPY (EGD) WITH PROPOFOL N/A  11/21/2018   Procedure: ESOPHAGOGASTRODUODENOSCOPY (EGD) WITH PROPOFOL;  Surgeon: [name], MD;  Location: [place];  Service: Gastroenterology;  Laterality: N/A;   ESOPHAGOGASTRODUODENOSCOPY (EGD) WITH PROPOFOL Left 02/27/2019   Procedure: ESOPHAGOGASTRODUODENOSCOPY (EGD) WITH PROPOFOL;  Surgeon: Arta Silence, MD;  Location: [facility] END[facility]COPY;  Service: Endoscopy;  Laterality: Left;   GIVENS CAPSULE STUDY N/A 11/21/2018   Procedure: GIVENS CAPSULE STUDY;  Surgeon: [name], MD;  Location: [place];  Service: Gastroenterology;  Laterality: N/A;  To be deployed during EGD   Lower Ext. Duplex  03/12/2012   Right Proximal CIA- vessel narrowing w/elevated velocities 0-49% diameter reduction. Right SFA-mild mixed density plaque throughout vessel.   NM MYOCAR PERF WALL MOTION  05/19/2010   protocol: Persantine, post stress EF 65%, negative for ischemia, low risk scan   RIGHT HEART CATH N/A 06/27/2018   Procedure: RIGHT HEART CATH;  Surgeon: [name], MD;  Location: [facility];  Service: Cardiovascular;  Laterality: N/A;   TOTAL ABDOMINAL HYSTERECTOMY W/ BILATERAL SALPINGOOPHORECTOMY  1989   TRANSTHORACIC ECHOCARDIOGRAM  08/29/2012   Moderately calcified annulus of mitral valve, moderate regurg. of both mitral valve and tricuspid valve.     Allergies:  Allergies  Allergen Reactions   Flagyl [Metronidazole] Rash and Other (See Comments)    ALL-OVER BODY RASH   Augmentin [Amoxicillin-Pot Clavulanate] Diarrhea and Nausea And Vomiting   Ciprofloxacin Diarrhea, Nausea Only and Other (See Comments)    Stomach ache   Coreg [Carvedilol] Other (See Comments)    Terrible cramping in the feet and had a lot of bowel movements, but not diarrhea   Diflucan [Fluconazole] Diarrhea   Losartan Swelling and Other (See Comments)    Patient doesn't recall site of swelling   Privigen [Immune Globulin (Human)] Other (See Comments)    Severe/excruciating pain   Sulfamethoxazole-Trimethoprim  Diarrhea, Nausea Only and Other (See Comments)    Stomach upset   Verapamil Hives   Zetia [Ezetimibe] Other (See Comments)    Reaction not recalled   Zocor [Simvastatin - High Dose] Other (See Comments)    Reaction not recalled   Gatifloxacin Rash and Other (See Comments)    Redness to skin around eye    Medications:   Prior to Admission medications   Medication Sig Start Date End Date Taking? Authorizing Provider  acetaminophen (TYLENOL) 325 MG tablet Take 325 mg by mouth every 6 (six) hours as needed for mild pain or fever.   Yes [provider]  alendronate (FOSAMAX) 70 MG tablet Take 70 mg by mouth once a week. Saturdays 12/06/21  Yes [provider]  allopurinol (ZYLOPRIM) 100 MG tablet Take 100 mg by mouth daily.   Yes [provider]  ALPRAZolam (XANAX) 0.5 MG tablet Take 1 tablet (0.5 mg total) by mouth at bedtime. 07/14/22  Yes Sheikh, Omair Latif, DO  amLODipine (NORVASC) 10 MG tablet TAKE 1 TABLET EVERY DAY Patient taking differently: Take 10 mg by mouth daily. 05/17/22  Yes [name], MD  aspirin 81 MG chewable tablet 1 tablet Orally Once a day 06/29/22  Yes [provider]  atorvastatin (LIPITOR) 80 MG tablet Take 1 tablet (80 mg total) by mouth daily at 6 PM. Patient taking differently: Take 80 mg by mouth daily. 11/22/18  Yes [name], MD  Cholecalciferol (VITAMIN D3) 50 MCG (2000 UT) capsule Take 2,000 Units by  mouth daily with lunch. 11/26/18  Yes [provider]  cloNIDine (CATAPRES) 0.2 MG tablet TAKE 1 TABLET TWICE DAILY 08/16/22  Yes [name], MD  Cyanocobalamin (VITAMIN B12) 1000 MCG TBCR Take 1,000 mcg by mouth daily with lunch.   Yes [provider]  famotidine (PEPCID) 20 MG tablet Take 1 tablet (20 mg total) by mouth 2 (two) times daily. 04/10/22  Yes Benay Pike, MD  folic acid (FOLVITE) 1 MG tablet Take 1 mg by mouth daily.   Yes [provider]  furosemide (LASIX) 40 MG tablet TAKE 2  TABLETS IN MORNING AND 1 AND 1/2 TABLETS IN EVENING. CHANGE IN DOSAGE. Patient taking differently: Take 40 mg by mouth See admin instructions. Take 2 tablets in the morning and 1 and 1/2 every evening 08/16/22  Yes Clegg, Amy D, NP  hydrALAZINE (APRESOLINE) 100 MG tablet TAKE 1 TABLET THREE TIMES DAILY Patient taking differently: Take 100 mg by mouth 3 (three) times daily. 02/17/22  Yes [name], MD  insulin aspart (NOVOLOG) 100 UNIT/ML injection Take 2 units for blood sugar 200-250, 4 units for blood sugar 252-300, 6 units for blood sugar 301-350, 8 units for blood sugar greater than 350 and call MD. Patient taking differently: Inject 2-8 Units into the skin 3 (three) times daily with meals. Per sliding scale, If blood sugar is 200 to 250, give 2 units. If blood sugar is 251 to 300, give 4 units. If blood sugar is 301 to 350, give 6 units. If blood sugar is greater than 350, give 8 units and call MD. 02/23/22  Yes Causey, Charlestine Massed, NP  Insulin Degludec (TRESIBA) 100 UNIT/ML SOLN Inject 9-14 Units into the skin at bedtime. 05/17/22  Yes [provider]  metoprolol succinate (TOPROL-XL) 25 MG 24 hr tablet TAKE 1/2 TABLET EVERY DAY 08/16/22  Yes [name], MD  ondansetron (ZOFRAN) 4 MG tablet Take 1 tablet (4 mg total) by mouth every 6 (six) hours as needed for nausea. 07/14/22  Yes Sheikh, Omair Latif, DO  ondansetron (ZOFRAN-ODT) 4 MG disintegrating tablet Take 4 mg by mouth every 8 (eight) hours as needed for nausea or vomiting. 01/12/22  Yes [provider]  pantoprazole (PROTONIX) 40 MG tablet Take 40 mg by mouth 2 (two) times daily. 06/25/22  Yes [provider]  predniSONE (DELTASONE) 5 MG tablet Take 20 mg by mouth daily. Take 1 tablet daily 07/17/22  Yes [provider]  saccharomyces boulardii (FLORASTOR) 250 MG capsule Take 250 mg by mouth 2 (two) times daily.   Yes [provider]  simethicone (GAS-X) 80 MG chewable tablet Chew 1  tablet (80 mg total) by mouth 4 (four) times daily as needed for flatulence. 04/16/22 04/16/23 Yes Danford, Suann Larry, MD  sodium bicarbonate 650 MG tablet Take 650 mg by mouth 2 (two) times daily. 06/29/22  Yes [provider]  spironolactone (ALDACTONE) 25 MG tablet TAKE 1 TABLET EVERY DAY Patient taking differently: Take 25 mg by mouth daily. 05/17/22  Yes [name], MD  tadalafil, PAH, (ADCIRCA) 20 MG tablet Take 2 tablets (40 mg total) by mouth daily. Patient taking differently: Take 40 mg by mouth at bedtime. 12/06/21  Yes [name], MD  vitamin C (ASCORBIC ACID) 500 MG tablet Take 500 mg by mouth daily with lunch.   Yes [provider]  warfarin (COUMADIN) 5 MG tablet TAKE 1 TO 1 AND 1/2 TABLETS DAILY AS DIRECTED BY COUMADIN CLINIC Patient taking differently:  Take 5 mg by mouth See admin instructions. Take 1 tablet ever day except for on Sundays 1 and 1/2 tablets 07/17/22  Yes Lorretta Harp, MD  cholestyramine Lucrezia Starch) 4 GM/DOSE powder Take 4 g by mouth daily after supper. Patient not taking: Reported on 08/17/2022 06/07/22   [provider]  doxycycline (VIBRA-TABS) 100 MG tablet Take 100 mg by mouth 2 (two) times daily. 08/25/22   [provider]  HYDROmorphone HCl (DILAUDID) 1 MG/ML LIQD Take 1-2 mLs (1-2 mg total) by mouth every 6 (six) hours as needed for severe pain or moderate pain (use oral medication prior to giving IV). Patient not taking: Reported on 08/17/2022 07/14/22   Raiford Noble Latif, DO  insulin glargine-yfgn [facility]) 100 UNIT/ML injection Inject 0.1 mLs (10 Units total) into the skin daily. Patient taking differently: Inject 0.1 mLs into the skin daily. 07/15/22   Sheikh, Georgina Quint Latif, DO  Insulin Syringe-Needle U-100 (B-D INS SYR ULTRAFINE .3CC/29G) 29G X 1/2" 0.3 ML MISC Use for sliding scale for breakthrough high blood sugar readings 02/24/22   Iruku, Arletha Pili, MD  mirabegron ER (MYRBETRIQ) 25 MG TB24 tablet Take 1 tablet  (25 mg total) by mouth daily. Patient not taking: Reported on 08/17/2022 04/17/22   Daleen Bo, MD    Inpatient medications:  allopurinol  100 mg Oral Daily   amLODipine  10 mg Oral Daily   atorvastatin  80 mg Oral q1800   Chlorhexidine Gluconate Cloth  6 each Topical Daily   famotidine  20 mg Oral QHS   feeding supplement (GLUCERNA SHAKE)  237 mL Oral TID BM   ferrous sulfate  325 mg Oral Q breakfast   furosemide  60 mg Oral QPM   furosemide  80 mg Oral Q breakfast   hydrALAZINE  100 mg Oral TID   insulin aspart  0-5 Units Subcutaneous QHS   insulin aspart  0-9 Units Subcutaneous TID WC   insulin glargine-yfgn  10 Units Subcutaneous QHS   metoprolol succinate  25 mg Oral Daily   pantoprazole  40 mg Oral BID   polyethylene glycol  17 g Oral BID   senna-docusate  2 tablet Oral QHS   Warfarin - Pharmacist Dosing Inpatient   Does not apply q1600    Discontinued Meds:   Medications Discontinued During This Encounter  Medication Reason   estradiol (ESTRACE) 0.1 MG/GM vaginal cream    lidocaine (XYLOCAINE) 2 % solution    nystatin-alum & mag hydroxide-simeth-diphenhydrAMINE    Menthol, Topical Analgesic, (BIOFREEZE) 4 % GEL    furosemide (LASIX) 20 MG tablet    cefdinir (OMNICEF) 300 MG capsule    heparin injection 5,000 Units    warfarin (COUMADIN) tablet 5 mg    sodium phosphate (FLEET) 7-19 GM/118ML enema 1 enema    sorbitol, milk of mag, mineral oil, glycerin (SMOG) enema    polyethylene glycol (MIRALAX / GLYCOLAX) packet 17 g    fluconazole (DIFLUCAN) 100 MG tablet Completed Course   nystatin (MYCOSTATIN) 100000 UNIT/ML suspension Completed Course   tamsulosin (FLOMAX) capsule 0.4 mg    furosemide (LASIX) injection 40 mg    metoprolol succinate (TOPROL-XL) 24 hr tablet 12.5 mg    metoprolol succinate (TOPROL-XL) 24 hr tablet 25 mg     Social History:  reports that she has quit smoking. Her smoking use included cigarettes. She has a 30.00 pack-year smoking history. She  has never used smokeless tobacco. She reports that she does not drink alcohol and does not use drugs.  Family History:   Family History  Problem Relation Age of Onset   Heart disease Father    Hypertension Father    Hyperlipidemia Father    Breast cancer Neg Hx     Pertinent items are noted in HPI. Weight change: -0.8 kg  Intake/Output Summary (Last 24 hours) at 09/04/2022 0915 Last data filed at 09/04/2022 5852 Gross per 24 hour  Intake 240 ml  Output 2100 ml  Net -1860 ml   BP (!) 152/58 (BP Location: Left Arm)   Pulse 90   Temp 97.7 F (36.5 C) (Temporal)   Resp 20   Ht 5' (1.524 m)   Wt 58.3 kg   SpO2 96%   BMI 25.10 kg/m  Vitals:   09/03/22 2207 09/04/22 0459 09/04/22 0500 09/04/22 0700  BP: (!) 156/62 (!) 127/59  (!) 152/58  Pulse: 98 87  90  Resp: '20 20  20  '$ Temp: 97.7 F (36.5 C) 98.2 F (36.8 C)  97.7 F (36.5 C)  TempSrc:    Temporal  SpO2: 95% 96%  96%  Weight:   58.3 kg   Height:         General appearance: alert, cooperative, fatigued, and no distress Head: Normocephalic, without obvious abnormality, atraumatic Resp: clear to auscultation bilaterally Cardio: RRR, metallic click, no rub GI: soft, non-tender; bowel sounds normal; no masses,  no organomegaly Extremities: edema trace pretibial edema bilaterally  Labs: Basic Metabolic Panel: Recent Labs  Lab 08/29/22 0401 08/30/22 0335 09/01/22 0429 09/03/22 0504 09/04/22 0554  NA 135 135 133* 136 134*  K 4.6 4.8 4.1 3.5 3.3*  CL 107 106 101 101 101  CO2 21* '22 23 24 24  '$ GLUCOSE 151* 184* 219* 89 105*  BUN 40* 42* 41* 42* 47*  CREATININE 2.30* 2.51* 2.20* 2.27* 2.50*  CALCIUM 8.2* 8.0* 8.1* 8.6* 8.5*   Liver Function Tests: No results for input(s): "AST", "ALT", "ALKPHOS", "BILITOT", "PROT", "ALBUMIN" in the last 168 hours. No results for input(s): "LIPASE", "AMYLASE" in the last 168 hours. No results for input(s): "AMMONIA" in the last 168 hours. CBC: Recent Labs  Lab 08/29/22 0401  08/30/22 0335  WBC 9.2 9.2  HGB 9.6* 9.7*  HCT 30.8* 31.3*  MCV 97.5 96.9  PLT 171 204   PT/INR: '@LABRCNTIP'$ (inr:5) Cardiac Enzymes: )No results for input(s): "CKTOTAL", "CKMB", "CKMBINDEX", "TROPONINI" in the last 168 hours. CBG: Recent Labs  Lab 09/03/22 0735 09/03/22 1207 09/03/22 1617 09/03/22 2203 09/04/22 0756  GLUCAP 90 167* 92 138* 101*    Iron Studies: No results for input(s): "IRON", "TIBC", "TRANSFERRIN", "FERRITIN" in the last 168 hours.  Xrays/Other Studies: No results found.   Assessment/Plan:  AKI/CKD stage IV - likely due to cardiorenal syndrome.  Initially was volume depleted but then developed CHF with IVF's and started on IV lasix with significant diuresis, further complicated by urinary retention.  Scr is still within her baseline.  No indication for kidney biopsy as recent ANCA panel was negative and UA without hematuria.  She has been off of rituximab and cellcept since May 2023.  Currently without uremic symptoms, although she has chronic nausea.  She is a poor candidate for HD given her multiple co-morbidities and poor functional and nutritional status.  Continue with current treatment for now and follow UOP and Scr.  Will recheck complements and ANCA (last done 06/30/22). Acute on  chronic CHF - improved with IV lasix, now on home po dose. Acute urinary retention - s/p Foley catheter placement.  On  flomax.  Will discharge home with foley and f/u with urology Acute on chronic constipation - per primary.  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  Currently off cellcept and rituximab.  F/u with Hematology.   S/p AVR on coumadin - per primary.  Chronic atrial fibrillation - on coumadin, rate controlled.  +ANA - 1:80 homogenous on 08/22/22.   [name] 09/04/2022, 9:15 AM

## 2022-09-04 NOTE — Care Management Important Message (Signed)
Important Message  Patient Details  Name: [name] MRN: [id number] Date of Birth: 03/24/1941   Medicare Important Message Given:  Yes     Tommy Medal 09/04/2022, 11:59 AM

## 2022-09-04 NOTE — Progress Notes (Signed)
Sat with pt at bedside tonight.  Pt reports that she desires to eat more and feels hungry but does not like the food here.  States that nausea is not keeping her from eating.  Oral cavity WNL.  Reviewed with pt if she thinks family can bring in a meal for her when they visit so that she will have an additional option, pt states she will ask but then states "I don't want to bother anyone."  Pt has some snacks in room, declined multiple offers for snacks.  Pt then states, "I was able to walk and get around before I came in here.  Now I don't think I can do anything.  I bet I couldn't even walk to the door and back."  Offered to get pt up to assess, pt declined.  Asked if pt would like to stand at bedside, pt refused.  Offered to sit patient up and dangle at bedside, pt states, "I just want to go to sleep."  Advised pt that if she would like to get up later to let this nurse know.  Pt then states her hair needed washed.  Noted that a shampoo cap had been on chair, package torn but cap not removed.  Offered to wash pt hair, pt declined.  States, "I'll just do it tomorrow."

## 2022-09-04 NOTE — TOC Progression Note (Signed)
Transition of Care [facility]) - Progression Note    Patient Details  Name: [name] MRN: [id number] Date of Birth: [birth year]  Transition of Care [facility]) CM/SW Contact  Shade Flood, LCSW Phone Number: 09/04/2022, 3:52 PM  Clinical Narrative:     TOC following. Bed offers finalized late this afternoon. Spoke with pt's daughter to review options. They have selected [facility]. Updated Admissions at [facility] and they can accept pt tomorrow if insurance authorization is approved.  TOC will follow up in AM.  Expected Discharge Plan: [place] Barriers to Discharge: Continued Medical Work up  Expected Discharge Plan and Services Expected Discharge Plan: [place] In-house Referral: Clinical Social Work   Post Acute Care Choice: Resumption of Svcs/PTA Provider Living arrangements for the past 2 months: Single Family Home Expected Discharge Date: 08/29/22                         HH Arranged: PT, OT HH Agency: [facility] Date [place]: 08/30/22 Time [place]: [id number] Representative spoke with at [place]: [place] ([place]) Interventions    Readmission Risk Interventions    08/30/2022   11:42 AM 04/20/2022   12:02 PM  Readmission Risk Prevention Plan  Transportation Screening Complete Complete  Medication Review Press photographer) Complete Complete  PCP or Specialist appointment within 3-5 days of discharge  Complete  HRI or [place] Complete Complete  SW Recovery Care/Counseling Consult Complete Complete  Palliative Care Screening Not Applicable Not [place] Not Applicable Not Applicable

## 2022-09-04 NOTE — Progress Notes (Signed)
ANTICOAGULATION CONSULT NOTE -   Pharmacy Consult for warfarin Indication: atrial fibrillation/mechanical aortic valve    Patient Measurements: Height: 5' (152.4 cm) Weight: 58.3 kg (128 lb 8.5 oz) IBW/kg (Calculated) : 45.5 Heparin Dosing Weight:   Vital Signs: Temp: 97.7 F (36.5 C) (11/27 0700) Temp Source: Temporal (11/27 0700) BP: 152/58 (11/27 0700) Pulse Rate: 90 (11/27 0700)  Labs: Recent Labs    09/02/22 0453 09/03/22 0504 09/04/22 0554  LABPROT 25.7* 31.1* 32.9*  INR 2.4* 3.0* 3.3*  CREATININE  --  2.27* 2.50*    Estimated Creatinine Clearance: 13.6 mL/min (A) (by C-G formula based on SCr of 2.5 mg/dL (H)).  Assessment: Pharmacy consulted to dose warfarin in patient with atrial fibrillation and mechanical aortic valve.  Patient's INR on admission is 2.1 and home dose listed as 7.5 mg on Sunday and 5 mg ROW.  INR 2.4 > 2.4 > 3.0 > 3.3  Goal of Therapy:  INR 2-3 Monitor platelets by anticoagulation protocol: Yes   Plan:  Hold warfarin x 1 dose. Monitor daily INR and s/s of bleeding  Margot Ables, PharmD Clinical Pharmacist 09/04/2022 9:33 AM

## 2022-09-04 NOTE — Progress Notes (Signed)
PROGRESS NOTE     [name], is a 81 y.o. female, DOB - 01/18/1941, ELF:[id number]  Admit date - 08/26/2022   Admitting Physician Courage Denton Brick, MD  Outpatient Primary MD for the patient is [name], [name], MD  LOS - 6  Cc---difficulty voiding     Brief Summary: 81 y.o. female with medical history significant of  history of mechanical aortic valve on chronic Coumadin, ANCA positive vasculitis on prednisone, chronic A-fib, chronic diastolic heart failure, CKD stage IV (baseline creatinine 1.9-2.1), type 2 diabetes mellitus admitted with abd pain/constipation and concerns for AKI on CKD IV 08/29/22--- patient with increasing shortness of breath fatigue and dyspnea on exertion, chest x-ray suggest CHF, BNP 498... Now requiring IV Lasix and close monitoring of renal function given underlying CKD 4     A/p 1)HFpEF--- acute on chronic diastolic dysfunction CHF--echo with EF of 55 to 60% --- patient with increasing shortness of breath fatigue and dyspnea on exertion, chest x-ray suggest CHF, BNP 498.  -O2 sats 90% on room air Treated initially with IV Lasix and close monitoring of renal function given underlying CKD 4 -CXR with some improvement; transitioned back to home oral lasix dosing   Intake/Output Summary (Last 24 hours) at 09/04/2022 1220 Last data filed at 09/04/2022 5852 Gross per 24 hour  Intake 240 ml  Output 2100 ml  Net -1860 ml   Filed Weights   09/02/22 0545 09/03/22 0518 09/04/22 0500  Weight: 60.8 kg 59.1 kg 58.3 kg    2)Acute kidney injury superimposed on CKD stage IV On admission BUN/creatinine 50/3.0 (baseline creatinine at 1.9-2.1) -Creatinine trending down with hydration (down to 2.30 from 3.0 on admission) Renally adjust medications, avoid nephrotoxic agents/dehydration/hypotension -Voiding difficulties persist as below #3 -monitoring renal function on Lasix -seems to be rapidly approaching stage V and having some symptoms of uremia  (nausea) -doubt patient would ever be a candidate for renal replacement therapy given advanced age and frailty   3)Acute urinary retention ----over 1 Liter of urine in bladder---voiding difficulties persist- requiring in and out  -c/n  Flomax...Marland KitchenMarland KitchenMarland Kitchen plan to be discharged home with Foley catheter with outpatient follow-up with urology for urodynamic studies/voiding trial  4)Acute on chronic constipation--- Abdominal improved after BMs with laxatives --On admission CT abdomen and pelvis without acute findings Continue MiraLAX, Senokot   5)Chronic iron deficiency anemia--- patient has history of refractory anemia suspect patient may also have some degree of anemia of CKD -Hgb currently stable close to baseline above 9 Continue ferrous sulfate   6)controlled type 2 DM - last A1c 6.3 reflecting excellent diabetic control PTA -Continue Semglee 10 units and adjust dose accordingly Use Novolog/Humalog Sliding scale insulin with Accu-Cheks/Fingersticks as ordered   CBG (last 3)  Recent Labs    09/03/22 1617 09/03/22 2203 09/04/22 0756  GLUCAP 92 138* 101*    7) ANCA Vasculitis Rash on legs erupted bilaterally after prednisone taper -Low index of suspicion for superimposed cellulitis -Currently off prednisone -Wound care consult for lower Extremity skin concerns   8) Chronic atrial fibrillation Continue Toprol for rate control ----Stroke prophylaxis with Coumadin -Echo from 05/24/2021 with EF of 55 to 60% with indeterminate diagnostic parameters  9) history of Aortic repair with mechanical valve  -Pharmacy to manage Coumadin therapy -Echo from May 24, 2022 showed- St Jude mechanical aortic valve. Mean gradient 26 mmHg with DI 0.33, EOA 1.00 cm^2. This suggests a degree of prosthetic valve stenosis (prior mean gradient 21 mmHg). No significant regurgitation.  -Outpatient follow-up  with cardiology advised   10)GERD Continue Pepcid, Protonix   11)Essential hypertension Continue  Toprol-XL, hydralazine, amlodipine   12)Gout Continue allopurinol  Pneumonia/Acute respiratory distress - bronchodilator treatment ordered - doxycycline 100 mg BID x 3 days course; completed 11/25  Disposition/Need for in-Hospital Stay- patient unable to be discharged at this time due to urinary difficulties with AKI on CKD requiring in and out catheterization* -Possible discharge home tomorrow if renal function remains stable and respiratory status improves -She will keep indwelling Foley catheter discharge with outpatient urology follow-up for voiding trial  Disposition: The patient is from: Home              Anticipated d/c is to: Pt agreed to SNF rehab; working on  placement               Anticipated d/c date is: 1 day              Patient currently is medically stable to d/c. Awaiting SNF bed placement  Barriers: SNF placement   Code Status :  -  Code Status: DNR   Family Communication:     (patient is alert, awake and coherent)  -Discussed with [name]  [phone] bedside update 11/23  DVT Prophylaxis  :   - SCDs  Place and maintain sequential compression device Start: 08/28/22 1456 Place TED hose Start: 08/28/22 1456 SCDs Start: 08/27/22 0700   Lab Results  Component Value Date   PLT 204 08/30/2022   Inpatient Medications  Scheduled Meds:  allopurinol  100 mg Oral Daily   amLODipine  10 mg Oral Daily   atorvastatin  80 mg Oral q1800   Chlorhexidine Gluconate Cloth  6 each Topical Daily   famotidine  20 mg Oral QHS   feeding supplement (GLUCERNA SHAKE)  237 mL Oral TID BM   ferrous sulfate  325 mg Oral Q breakfast   furosemide  60 mg Oral QPM   furosemide  80 mg Oral Q breakfast   hydrALAZINE  100 mg Oral TID   insulin aspart  0-5 Units Subcutaneous QHS   insulin aspart  0-9 Units Subcutaneous TID WC   insulin glargine-yfgn  10 Units Subcutaneous QHS   metoprolol succinate  25 mg Oral Daily   pantoprazole  40 mg Oral BID   polyethylene glycol  17 g Oral  BID   senna-docusate  2 tablet Oral QHS   Warfarin - Pharmacist Dosing Inpatient   Does not apply q1600   Continuous Infusions: PRN Meds:.acetaminophen **OR** acetaminophen, ALPRAZolam, levalbuterol, ondansetron **OR** ondansetron (ZOFRAN) IV, oxyCODONE   Anti-infectives (From admission, onward)    Start     Dose/Rate Route Frequency Ordered Stop   08/31/22 1515  doxycycline (VIBRA-TABS) tablet 100 mg        100 mg Oral Every 12 hours 08/31/22 1422 09/02/22 2137       Subjective: Jazlynne Gledhill overall feels weak, willing to get to chair with assistance today; abd pain slowly improving.      Objective: Vitals:   09/03/22 2207 09/04/22 0459 09/04/22 0500 09/04/22 0700  BP: (!) 156/62 (!) 127/59  (!) 152/58  Pulse: 98 87  90  Resp: '20 20  20  '$ Temp: 97.7 F (36.5 C) 98.2 F (36.8 C)  97.7 F (36.5 C)  TempSrc:    Temporal  SpO2: 95% 96%  96%  Weight:   58.3 kg   Height:        Intake/Output Summary (Last 24 hours) at 09/04/2022 1220 Last  data filed at 09/04/2022 0903 Gross per 24 hour  Intake 240 ml  Output 2100 ml  Net -1860 ml   Filed Weights   09/02/22 0545 09/03/22 0518 09/04/22 0500  Weight: 60.8 kg 59.1 kg 58.3 kg   Physical Exam  Gen:- frail, elderly female, Awake Alert, no acute distress HEENT:- NCAT, No sclera icterus, PERRL.  Neck-Supple, no thyroid masses palpated.  Lungs: good air movement, no crackles or wheeze heard, no increased work of breathing.   CV- normal S1, S2 sounds.   Abd-  normal active BS, Abd Soft, No tenderness, no HSM.  Extremity/Skin:- trace pretibial edema, pedal pulses present pedal edema nearly resolved Psych-affect is flat; oriented x3 Neuro-generalized weakness no new focal deficits, no tremors GU- foley placed, amber urine seen.   Data Reviewed: I have personally reviewed following labs and imaging studies  CBC: Recent Labs  Lab 08/29/22 0401 08/30/22 0335  WBC 9.2 9.2  HGB 9.6* 9.7*  HCT 30.8* 31.3*  MCV 97.5 96.9   PLT 171 096   Basic Metabolic Panel: Recent Labs  Lab 08/29/22 0401 08/30/22 0335 09/01/22 0429 09/03/22 0504 09/04/22 0554  NA 135 135 133* 136 134*  K 4.6 4.8 4.1 3.5 3.3*  CL 107 106 101 101 101  CO2 21* '22 23 24 24  '$ GLUCOSE 151* 184* 219* 89 105*  BUN 40* 42* 41* 42* 47*  CREATININE 2.30* 2.51* 2.20* 2.27* 2.50*  CALCIUM 8.2* 8.0* 8.1* 8.6* 8.5*  MG  --   --  2.1  --  1.8   GFR: Estimated Creatinine Clearance: 13.6 mL/min (A) (by C-G formula based on SCr of 2.5 mg/dL (H)). Liver Function Tests: No results for input(s): "AST", "ALT", "ALKPHOS", "BILITOT", "PROT", "ALBUMIN" in the last 168 hours.  Radiology Studies: No results found.   Scheduled Meds:  allopurinol  100 mg Oral Daily   amLODipine  10 mg Oral Daily   atorvastatin  80 mg Oral q1800   Chlorhexidine Gluconate Cloth  6 each Topical Daily   famotidine  20 mg Oral QHS   feeding supplement (GLUCERNA SHAKE)  237 mL Oral TID BM   ferrous sulfate  325 mg Oral Q breakfast   furosemide  60 mg Oral QPM   furosemide  80 mg Oral Q breakfast   hydrALAZINE  100 mg Oral TID   insulin aspart  0-5 Units Subcutaneous QHS   insulin aspart  0-9 Units Subcutaneous TID WC   insulin glargine-yfgn  10 Units Subcutaneous QHS   metoprolol succinate  25 mg Oral Daily   pantoprazole  40 mg Oral BID   polyethylene glycol  17 g Oral BID   senna-docusate  2 tablet Oral QHS   Warfarin - Pharmacist Dosing Inpatient   Does not apply q1600   Continuous Infusions:   LOS: 6 days   Irwin Brakeman M.D on 09/04/2022 at 12:20 PM  Go to [email or web address] - for contact info  [facility] - Office  [phone]  If 7PM-7AM, please contact night-coverage [email or web address] 09/04/2022, 12:20 PM

## 2022-09-05 DIAGNOSIS — I2721 Secondary pulmonary arterial hypertension: Secondary | ICD-10-CM

## 2022-09-05 DIAGNOSIS — R109 Unspecified abdominal pain: Secondary | ICD-10-CM | POA: Diagnosis not present

## 2022-09-05 DIAGNOSIS — R1312 Dysphagia, oropharyngeal phase: Secondary | ICD-10-CM | POA: Diagnosis not present

## 2022-09-05 DIAGNOSIS — I7782 Antineutrophilic cytoplasmic antibody (ANCA) vasculitis: Secondary | ICD-10-CM | POA: Diagnosis not present

## 2022-09-05 DIAGNOSIS — I10 Essential (primary) hypertension: Secondary | ICD-10-CM | POA: Diagnosis not present

## 2022-09-05 DIAGNOSIS — M79675 Pain in left toe(s): Secondary | ICD-10-CM | POA: Diagnosis not present

## 2022-09-05 DIAGNOSIS — I480 Paroxysmal atrial fibrillation: Secondary | ICD-10-CM | POA: Diagnosis not present

## 2022-09-05 DIAGNOSIS — Z7189 Other specified counseling: Secondary | ICD-10-CM

## 2022-09-05 DIAGNOSIS — E119 Type 2 diabetes mellitus without complications: Secondary | ICD-10-CM | POA: Diagnosis not present

## 2022-09-05 DIAGNOSIS — I739 Peripheral vascular disease, unspecified: Secondary | ICD-10-CM | POA: Diagnosis not present

## 2022-09-05 DIAGNOSIS — D649 Anemia, unspecified: Secondary | ICD-10-CM | POA: Diagnosis not present

## 2022-09-05 DIAGNOSIS — E118 Type 2 diabetes mellitus with unspecified complications: Secondary | ICD-10-CM | POA: Diagnosis not present

## 2022-09-05 DIAGNOSIS — M6281 Muscle weakness (generalized): Secondary | ICD-10-CM | POA: Diagnosis not present

## 2022-09-05 DIAGNOSIS — R2681 Unsteadiness on feet: Secondary | ICD-10-CM | POA: Diagnosis not present

## 2022-09-05 DIAGNOSIS — R300 Dysuria: Secondary | ICD-10-CM | POA: Diagnosis not present

## 2022-09-05 DIAGNOSIS — F4323 Adjustment disorder with mixed anxiety and depressed mood: Secondary | ICD-10-CM | POA: Diagnosis not present

## 2022-09-05 DIAGNOSIS — R600 Localized edema: Secondary | ICD-10-CM | POA: Diagnosis not present

## 2022-09-05 DIAGNOSIS — Z7901 Long term (current) use of anticoagulants: Secondary | ICD-10-CM | POA: Diagnosis not present

## 2022-09-05 DIAGNOSIS — N189 Chronic kidney disease, unspecified: Secondary | ICD-10-CM | POA: Diagnosis not present

## 2022-09-05 DIAGNOSIS — N179 Acute kidney failure, unspecified: Secondary | ICD-10-CM | POA: Diagnosis not present

## 2022-09-05 DIAGNOSIS — Z741 Need for assistance with personal care: Secondary | ICD-10-CM | POA: Diagnosis not present

## 2022-09-05 DIAGNOSIS — I5032 Chronic diastolic (congestive) heart failure: Secondary | ICD-10-CM | POA: Diagnosis not present

## 2022-09-05 DIAGNOSIS — D594 Other nonautoimmune hemolytic anemias: Secondary | ICD-10-CM | POA: Diagnosis not present

## 2022-09-05 DIAGNOSIS — I5033 Acute on chronic diastolic (congestive) heart failure: Secondary | ICD-10-CM | POA: Diagnosis not present

## 2022-09-05 DIAGNOSIS — M8580 Other specified disorders of bone density and structure, unspecified site: Secondary | ICD-10-CM | POA: Diagnosis not present

## 2022-09-05 DIAGNOSIS — I509 Heart failure, unspecified: Secondary | ICD-10-CM

## 2022-09-05 DIAGNOSIS — R338 Other retention of urine: Secondary | ICD-10-CM | POA: Diagnosis not present

## 2022-09-05 DIAGNOSIS — K219 Gastro-esophageal reflux disease without esophagitis: Secondary | ICD-10-CM | POA: Diagnosis not present

## 2022-09-05 DIAGNOSIS — N390 Urinary tract infection, site not specified: Secondary | ICD-10-CM | POA: Diagnosis not present

## 2022-09-05 DIAGNOSIS — E1151 Type 2 diabetes mellitus with diabetic peripheral angiopathy without gangrene: Secondary | ICD-10-CM | POA: Diagnosis not present

## 2022-09-05 DIAGNOSIS — E1122 Type 2 diabetes mellitus with diabetic chronic kidney disease: Secondary | ICD-10-CM | POA: Diagnosis not present

## 2022-09-05 DIAGNOSIS — Z952 Presence of prosthetic heart valve: Secondary | ICD-10-CM | POA: Diagnosis not present

## 2022-09-05 DIAGNOSIS — M25551 Pain in right hip: Secondary | ICD-10-CM | POA: Diagnosis not present

## 2022-09-05 DIAGNOSIS — I130 Hypertensive heart and chronic kidney disease with heart failure and stage 1 through stage 4 chronic kidney disease, or unspecified chronic kidney disease: Secondary | ICD-10-CM | POA: Diagnosis not present

## 2022-09-05 DIAGNOSIS — M1611 Unilateral primary osteoarthritis, right hip: Secondary | ICD-10-CM | POA: Diagnosis not present

## 2022-09-05 DIAGNOSIS — N184 Chronic kidney disease, stage 4 (severe): Secondary | ICD-10-CM | POA: Diagnosis not present

## 2022-09-05 DIAGNOSIS — M79674 Pain in right toe(s): Secondary | ICD-10-CM | POA: Diagnosis not present

## 2022-09-05 DIAGNOSIS — D590 Drug-induced autoimmune hemolytic anemia: Secondary | ICD-10-CM | POA: Diagnosis not present

## 2022-09-05 DIAGNOSIS — E440 Moderate protein-calorie malnutrition: Secondary | ICD-10-CM | POA: Diagnosis not present

## 2022-09-05 DIAGNOSIS — B351 Tinea unguium: Secondary | ICD-10-CM | POA: Diagnosis not present

## 2022-09-05 DIAGNOSIS — D638 Anemia in other chronic diseases classified elsewhere: Secondary | ICD-10-CM | POA: Diagnosis not present

## 2022-09-05 DIAGNOSIS — I482 Chronic atrial fibrillation, unspecified: Secondary | ICD-10-CM | POA: Diagnosis not present

## 2022-09-05 DIAGNOSIS — M6259 Muscle wasting and atrophy, not elsewhere classified, multiple sites: Secondary | ICD-10-CM | POA: Diagnosis not present

## 2022-09-05 DIAGNOSIS — D591 Autoimmune hemolytic anemia, unspecified: Secondary | ICD-10-CM | POA: Diagnosis not present

## 2022-09-05 DIAGNOSIS — D509 Iron deficiency anemia, unspecified: Secondary | ICD-10-CM | POA: Diagnosis not present

## 2022-09-05 LAB — C4 COMPLEMENT: Complement C4, Body Fluid: 43 mg/dL — ABNORMAL HIGH (ref 12–38)

## 2022-09-05 LAB — RENAL FUNCTION PANEL
Albumin: 2.8 g/dL — ABNORMAL LOW (ref 3.5–5.0)
Anion gap: 9 (ref 5–15)
BUN: 51 mg/dL — ABNORMAL HIGH (ref 8–23)
CO2: 25 mmol/L (ref 22–32)
Calcium: 8.7 mg/dL — ABNORMAL LOW (ref 8.9–10.3)
Chloride: 100 mmol/L (ref 98–111)
Creatinine, Ser: 2.57 mg/dL — ABNORMAL HIGH (ref 0.44–1.00)
GFR, Estimated: 18 mL/min — ABNORMAL LOW (ref >60)
Glucose, Bld: 154 mg/dL — ABNORMAL HIGH (ref 70–99)
Phosphorus: 3.6 mg/dL (ref 2.5–4.6)
Potassium: 3.2 mmol/L — ABNORMAL LOW (ref 3.5–5.1)
Sodium: 134 mmol/L — ABNORMAL LOW (ref 135–145)

## 2022-09-05 LAB — GLUCOSE, CAPILLARY
Glucose-Capillary: 110 mg/dL — ABNORMAL HIGH (ref 70–99)
Glucose-Capillary: 188 mg/dL — ABNORMAL HIGH (ref 70–99)

## 2022-09-05 LAB — CBC WITH DIFFERENTIAL/PLATELET
Abs Immature Granulocytes: 0.1 10*3/uL — ABNORMAL HIGH (ref 0.00–0.07)
Band Neutrophils: 2 %
Basophils Absolute: 0.1 10*3/uL (ref 0.0–0.1)
Basophils Relative: 1 %
Eosinophils Absolute: 0.3 10*3/uL (ref 0.0–0.5)
Eosinophils Relative: 4 %
HCT: 34.2 % — ABNORMAL LOW (ref 36.0–46.0)
Hemoglobin: 11 g/dL — ABNORMAL LOW (ref 12.0–15.0)
Lymphocytes Relative: 48 %
Lymphs Abs: 3.1 10*3/uL (ref 0.7–4.0)
MCH: 29.6 pg (ref 26.0–34.0)
MCHC: 32.2 g/dL (ref 30.0–36.0)
MCV: 91.9 fL (ref 80.0–100.0)
Metamyelocytes Relative: 2 %
Monocytes Absolute: 0.5 10*3/uL (ref 0.1–1.0)
Monocytes Relative: 7 %
Neutro Abs: 2.5 10*3/uL (ref 1.7–7.7)
Neutrophils Relative %: 36 %
Platelets: 178 10*3/uL (ref 150–400)
RBC: 3.72 MIL/uL — ABNORMAL LOW (ref 3.87–5.11)
RDW: 16 % — ABNORMAL HIGH (ref 11.5–15.5)
WBC: 6.5 10*3/uL (ref 4.0–10.5)
nRBC: 0 % (ref 0.0–0.2)

## 2022-09-05 LAB — C3 COMPLEMENT: C3 Complement: 129 mg/dL (ref 82–167)

## 2022-09-05 LAB — PROTIME-INR
INR: 2.8 — ABNORMAL HIGH (ref 0.8–1.2)
Prothrombin Time: 29.3 seconds — ABNORMAL HIGH (ref 11.4–15.2)

## 2022-09-05 MED ORDER — INSULIN GLARGINE-YFGN 100 UNIT/ML ~~LOC~~ SOLN: 10.0000 [IU] | Freq: Every day | SUBCUTANEOUS | 11 refills | Status: DC

## 2022-09-05 MED ORDER — FERROUS SULFATE 325 (65 FE) MG PO TABS: 325.0000 mg | ORAL_TABLET | Freq: Every day | ORAL | 3 refills | Status: DC

## 2022-09-05 MED ORDER — GLUCERNA SHAKE PO LIQD: 237.0000 mL | Freq: Three times a day (TID) | ORAL | 0 refills | Status: DC

## 2022-09-05 MED ORDER — ALPRAZOLAM 0.25 MG PO TABS: 0.2500 mg | ORAL_TABLET | Freq: Two times a day (BID) | ORAL | 0 refills | Status: DC | PRN

## 2022-09-05 MED ORDER — WARFARIN SODIUM 2 MG PO TABS: 4.0000 mg | ORAL_TABLET | Freq: Once | ORAL | Status: DC

## 2022-09-05 MED ORDER — WARFARIN SODIUM 5 MG PO TABS: 5.0000 mg | ORAL_TABLET | ORAL | Status: DC

## 2022-09-05 MED ORDER — INSULIN ASPART 100 UNIT/ML IJ SOLN: 2.0000 [IU] | Freq: Three times a day (TID) | INTRAMUSCULAR | Status: DC

## 2022-09-05 MED ORDER — ALUM & MAG HYDROXIDE-SIMETH 200-200-20 MG/5ML PO SUSP
15.0000 mL | ORAL | Status: DC | PRN
  Administered 2022-09-05: 15 mL via ORAL
  Filled 2022-09-05: qty 30

## 2022-09-05 MED ORDER — METOPROLOL SUCCINATE ER 25 MG PO TB24: 25.0000 mg | ORAL_TABLET | Freq: Every day | ORAL | 0 refills | Status: DC

## 2022-09-05 MED ORDER — FAMOTIDINE 20 MG PO TABS: 20.0000 mg | ORAL_TABLET | Freq: Every day | ORAL | 3 refills | Status: DC

## 2022-09-05 NOTE — Discharge Instructions (Signed)
Please check PT/INR every 3 days until it has been stable/therapeutic for at least a week.    Please check blood sugar 4 times per day.    Please arrange for outpatient palliative medicine consultation

## 2022-09-05 NOTE — Progress Notes (Signed)
Report given to [facility] LPN at [facility]

## 2022-09-05 NOTE — Progress Notes (Signed)
[facility]) Hospital Liaison note:  Notified by Laveda Norman of request for [place] services. Will continue to follow for disposition.  Please call with any outpatient palliative questions or concerns.  Thank you for the opportunity to participate in this patient's care.  Thank you, Lorelee Market, LPN [facility] Liaison [phone]

## 2022-09-05 NOTE — Progress Notes (Signed)
ANTICOAGULATION CONSULT NOTE -   Pharmacy Consult for warfarin Indication: atrial fibrillation/mechanical aortic valve    Patient Measurements: Height: 5' (152.4 cm) Weight: 57.6 kg (126 lb 15.8 oz) IBW/kg (Calculated) : 45.5 Heparin Dosing Weight:   Vital Signs: Temp: 98.7 F (37.1 C) (11/28 0317) Temp Source: Oral (11/28 0317) BP: 146/62 (11/28 0323) Pulse Rate: 88 (11/28 0317)  Labs: Recent Labs    09/03/22 0504 09/04/22 0554 09/05/22 0353  HGB  --   --  11.0*  HCT  --   --  34.2*  PLT  --   --  178  LABPROT 31.1* 32.9* 29.3*  INR 3.0* 3.3* 2.8*  CREATININE 2.27* 2.50* 2.57*    Estimated Creatinine Clearance: 13.2 mL/min (A) (by C-G formula based on SCr of 2.57 mg/dL (H)).  Assessment: Pharmacy consulted to dose warfarin in patient with atrial fibrillation and mechanical aortic valve.  Patient's INR on admission is 2.1 and home dose listed as 7.5 mg on Sunday and 5 mg ROW. May need to adjust home dosing regimen since INR keeps going supratherapeutic on home dose.  INR 2.4 > 2.4 > 3.0 > 3.3> 2.8  Goal of Therapy:  INR 2-3 Monitor platelets by anticoagulation protocol: Yes   Plan:  Warfarin 4 mg x 1 dose. Monitor daily INR and s/s of bleeding  Margot Ables, PharmD Clinical Pharmacist 09/05/2022 7:50 AM

## 2022-09-05 NOTE — Progress Notes (Signed)
Patient ID: [name], female   DOB: 11-05-1940, 81 y.o.   MRN: [id number] S: No new complaints.  Reports that she is going to rehab today. O:BP (!) 146/62 (BP Location: Left Arm)   Pulse 88   Temp 98.7 F (37.1 C) (Oral)   Resp 16   Ht 5' (1.524 m)   Wt 57.6 kg   SpO2 97%   BMI 24.80 kg/m   Intake/Output Summary (Last 24 hours) at 09/05/2022 1257 Last data filed at 09/05/2022 0919 Gross per 24 hour  Intake 960 ml  Output 400 ml  Net 560 ml   Intake/Output: I/O last 3 completed shifts: In: 960 [P.O.:960] Out: 1300 [Urine:1300]  Intake/Output this shift:  Total I/O In: 240 [P.O.:240] Out: -  Weight change: -0.7 kg Gen: frail, chronically ill-appearing female in NAD CVS: RRR Resp:CTA Abd: +BS, soft, NT/DN Ext: trace pretibial edema  Recent Labs  Lab 08/30/22 0335 09/01/22 0429 09/03/22 0504 09/04/22 0554 09/05/22 0353  NA 135 133* 136 134* 134*  K 4.8 4.1 3.5 3.3* 3.2*  CL 106 101 101 101 100  CO2 '22 23 24 24 25  '$ GLUCOSE 184* 219* 89 105* 154*  BUN 42* 41* 42* 47* 51*  CREATININE 2.51* 2.20* 2.27* 2.50* 2.57*  ALBUMIN  --   --   --   --  2.8*  CALCIUM 8.0* 8.1* 8.6* 8.5* 8.7*  PHOS  --   --   --   --  3.6   Liver Function Tests: Recent Labs  Lab 09/05/22 0353  ALBUMIN 2.8*   No results for input(s): "LIPASE", "AMYLASE" in the last 168 hours. No results for input(s): "AMMONIA" in the last 168 hours. CBC: Recent Labs  Lab 08/30/22 0335 09/05/22 0353  WBC 9.2 6.5  NEUTROABS  --  2.5  HGB 9.7* 11.0*  HCT 31.3* 34.2*  MCV 96.9 91.9  PLT 204 178   Cardiac Enzymes: No results for input(s): "CKTOTAL", "CKMB", "CKMBINDEX", "TROPONINI" in the last 168 hours. CBG: Recent Labs  Lab 09/04/22 1257 09/04/22 1711 09/04/22 1944 09/05/22 0738 09/05/22 1130  GLUCAP 155* 85 176* 110* 188*    Iron Studies: No results for input(s): "IRON", "TIBC", "TRANSFERRIN", "FERRITIN" in the last 72 hours. Studies/Results: No results found.  allopurinol  100 mg  Oral Daily   amLODipine  10 mg Oral Daily   atorvastatin  80 mg Oral q1800   Chlorhexidine Gluconate Cloth  6 each Topical Daily   famotidine  20 mg Oral QHS   feeding supplement (GLUCERNA SHAKE)  237 mL Oral TID BM   ferrous sulfate  325 mg Oral Q breakfast   furosemide  60 mg Oral QPM   furosemide  80 mg Oral Q breakfast   hydrALAZINE  100 mg Oral TID   insulin aspart  0-5 Units Subcutaneous QHS   insulin aspart  0-9 Units Subcutaneous TID WC   insulin glargine-yfgn  10 Units Subcutaneous QHS   metoprolol succinate  25 mg Oral Daily   pantoprazole  40 mg Oral BID   polyethylene glycol  17 g Oral BID   senna-docusate  2 tablet Oral QHS   warfarin  4 mg Oral ONCE-1600   Warfarin - Pharmacist Dosing Inpatient   Does not apply q1600    BMET    Component Value Date/Time   NA 134 (L) 09/05/2022 0353   NA 137 12/06/2017 1436   K 3.2 (L) 09/05/2022 0353   CL 100 09/05/2022 0353   CO2 25 09/05/2022  0353   GLUCOSE 154 (H) 09/05/2022 0353   BUN 51 (H) 09/05/2022 0353   BUN 20 12/06/2017 1436   CREATININE 2.57 (H) 09/05/2022 0353   CREATININE 2.44 (H) 08/18/2022 0918   CALCIUM 8.7 (L) 09/05/2022 0353   GFRNONAA 18 (L) 09/05/2022 0353   GFRNONAA 19 (L) 08/18/2022 0918   GFRAA 32 (L) 03/30/2020 0932   CBC    Component Value Date/Time   WBC 6.5 09/05/2022 0353   RBC 3.72 (L) 09/05/2022 0353   HGB 11.0 (L) 09/05/2022 0353   HGB 9.2 (L) 08/18/2022 0918   HCT 34.2 (L) 09/05/2022 0353   HCT 28.5 (L) 11/20/2018 1824   PLT 178 09/05/2022 0353   PLT 154 08/18/2022 0918   MCV 91.9 09/05/2022 0353   MCH 29.6 09/05/2022 0353   MCHC 32.2 09/05/2022 0353   RDW 16.0 (H) 09/05/2022 0353   LYMPHSABS 3.1 09/05/2022 0353   MONOABS 0.5 09/05/2022 0353   EOSABS 0.3 09/05/2022 0353   BASOSABS 0.1 09/05/2022 0353     Assessment/Plan:  AKI/CKD stage IV - likely due to cardiorenal syndrome.  Initially was volume depleted but then developed CHF with IVF's and started on IV lasix with  significant diuresis, further complicated by urinary retention.  Scr is still within her baseline.  No indication for kidney biopsy as recent ANCA panel was negative and UA without hematuria.  She has been off of rituximab and cellcept since May 2023 (no kidney biopsy done as she was already receiving treatment).  Currently without uremic symptoms, although she has chronic nausea.  She is a poor candidate for HD given her multiple co-morbidities and poor functional and nutritional status.  Continue with current treatment for now and follow UOP and Scr.  Will recheck complements and ANCA (last done 06/30/22) and are currently pending.  She was followed by Dr. [name] in our office but now is being followed by [facility].  She states that she has a follow up appointment with them. Acute on  chronic CHF - improved with IV lasix, now on home po dose. Acute urinary retention - s/p Foley catheter placement.  On flomax.  Will discharge home with foley and f/u with urology Acute on chronic constipation - per primary.  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  Currently off cellcept and rituximab.  F/u with Hematology.   S/p AVR on coumadin - per primary.  Chronic atrial fibrillation - on coumadin, rate controlled.  +ANA - 1:80 homogenous on 08/22/22.  Deconditioning - to go to rehab.   Donetta Potts, MD [facility]

## 2022-09-05 NOTE — Discharge Summary (Addendum)
Physician Discharge Summary  [name] PJK:[id number] DOB: 06-15-1941 DOA: 08/26/2022  PCP: [name], MD  Admit date: 08/26/2022 Discharge date: 09/05/2022  Admitted From:  Home  Disposition: SNF   Recommendations for Outpatient Follow-up:  Follow up with urology clinic in 1 weeks for foley removal/voiding trial Please check BMP in 1-2 weeks Please check PT/INR every 2-3 days until INR stabilized then weekly Please check blood glucose at least 3 times per day Please follow up with nephrology in 2-3 weeks Please order outpatient palliative care consultation  Please follow up with cardiology in 3 weeks  Discharge Condition: STABLE   CODE STATUS: DNR   Diet: heart healthy/carb modified  Brief Hospitalization Summary: Please see all hospital notes, images, labs for full details of the hospitalization. Brief Summary: 81 y.o. female with medical history significant of  history of mechanical aortic valve on chronic Coumadin, ANCA positive vasculitis on prednisone, chronic A-fib, chronic diastolic heart failure, CKD stage IV (baseline creatinine 1.9-2.1), type 2 diabetes mellitus admitted with abd pain/constipation and concerns for AKI on CKD IV 08/29/22--- patient with increasing shortness of breath fatigue and dyspnea on exertion, chest x-ray suggest CHF, BNP 498... Now requiring IV Lasix and close monitoring of renal function given underlying CKD 4     A/p 1)HFpEF--- acute on chronic diastolic dysfunction CHF--echo with EF of 55 to 60% --- patient with increasing shortness of breath fatigue and dyspnea on exertion, chest x-ray suggest CHF, BNP 498.  -O2 sats 90% on room air Treated initially with IV Lasix and close monitoring of renal function given underlying CKD 4 -CXR with some improvement; transitioned back to home oral lasix dosing    Intake/Output Summary (Last 24 hours) at 09/04/2022 1220 Last data filed at 09/04/2022 8099    Gross per 24 hour  Intake 240 ml   Output 2100 ml  Net -1860 ml         Filed Weights    09/02/22 0545 09/03/22 0518 09/04/22 0500  Weight: 60.8 kg 59.1 kg 58.3 kg      2)Acute kidney injury superimposed on CKD stage IV On admission BUN/creatinine 50/3.0 (baseline creatinine at 1.9-2.1) -Creatinine trending down with hydration (down to 2.30 from 3.0 on admission) Renally adjust medications, avoid nephrotoxic agents/dehydration/hypotension -Voiding difficulties persist as below #3 -monitoring renal function on Lasix -seems to be rapidly approaching stage V and having some symptoms of uremia (nausea) -doubt patient would ever be a candidate for renal replacement therapy given advanced age and frailty   3)Acute urinary retention ----over 1 Liter of urine in bladder---voiding difficulties persist- requiring in and out  -c/n  Flomax...Marland KitchenMarland KitchenMarland Kitchen plan to be discharged home with Foley catheter with outpatient follow-up with urology for urodynamic studies/voiding trial   4)Acute on chronic constipation--- Abdominal improved after BMs with laxatives --On admission CT abdomen and pelvis without acute findings Continue MiraLAX, Senokot   5)Chronic iron deficiency anemia--- patient has history of refractory anemia suspect patient may also have some degree of anemia of CKD -Hgb currently stable close to baseline above 9 Continue ferrous sulfate   6)controlled type 2 DM - last A1c 6.3 reflecting excellent diabetic control PTA -Continue Semglee 10 units -Please monitor blood sugar at least 3 times per day  CBG (last 3)  Recent Labs (last 2 labs)       Recent Labs    09/03/22 1617 09/03/22 2203 09/04/22 0756  GLUCAP 92 138* 101*        7) ANCA Vasculitis Rash on  legs erupted bilaterally after prednisone taper -Low index of suspicion for superimposed cellulitis -Currently off prednisone -Wound care consult appreciated    8) Chronic atrial fibrillation Continue Toprol for rate control ----Stroke prophylaxis with  Coumadin -Echo from 05/24/2021 with EF of 55 to 60% with indeterminate diagnostic parameters   9) history of Aortic repair with mechanical valve  -Pharmacy to manage Coumadin therapy -Echo from May 24, 2022 showed- St Jude mechanical aortic valve. Mean gradient 26 mmHg with DI 0.33, EOA 1.00 cm^2. This suggests a degree of prosthetic valve stenosis (prior mean gradient 21 mmHg). No significant regurgitation.  -Outpatient follow-up with cardiology advised   10)GERD Continue Pepcid, Protonix   11)Essential hypertension Continue Toprol-XL, hydralazine, amlodipine   12)Gout Continue allopurinol   Pneumonia/Acute respiratory distress - bronchodilator treatment ordered - doxycycline 100 mg BID x 3 days course; completed 11/25    Disposition: SNF rehab   Discharge Diagnoses:  Principal Problem:   Acute kidney injury superimposed on chronic kidney disease ([place]) Active Problems:   Essential hypertension   Mechanical heart valve present   Anemia   GERD without esophagitis   Chronic atrial fibrillation   H/O mechanical aortic valve replacement   Moderate to severe pulmonary hypertension (HCC)   Iron deficiency anemia   PAF (paroxysmal atrial fibrillation) (HCC)   Diabetic retinopathy associated with type 2 diabetes mellitus (HCC)   Refractory anemia (HCC)   PAH (pulmonary artery hypertension) (HCC)   Constipation   Nausea   Hypoalbuminemia due to protein-calorie malnutrition (HCC)   Type 2 diabetes mellitus with hyperglycemia ([place])   Acute urinary retention   Acute on chronic heart failure with preserved ejection fraction (HFpEF) (HCC)   Acute exacerbation of CHF (congestive heart failure) ([place])   Discharge Instructions: Discharge Instructions     Ambulatory referral to Nephrology   Complete by: As directed    Hospital follow up 2 weeks   Ambulatory referral to Urology   Complete by: As directed    Hospital follow up for voiding trial foley removal      Allergies as  of 09/05/2022       Reactions   Flagyl [metronidazole] Rash, Other (See Comments)   ALL-OVER BODY RASH   Augmentin [amoxicillin-pot Clavulanate] Diarrhea, Nausea And Vomiting   Ciprofloxacin Diarrhea, Nausea Only, Other (See Comments)   Stomach ache   Coreg [carvedilol] Other (See Comments)   Terrible cramping in the feet and had a lot of bowel movements, but not diarrhea   Diflucan [fluconazole] Diarrhea   Losartan Swelling, Other (See Comments)   Patient doesn't recall site of swelling   Privigen [immune Globulin (human)] Other (See Comments)   Severe/excruciating pain   Sulfamethoxazole-trimethoprim Diarrhea, Nausea Only, Other (See Comments)   Stomach upset   Verapamil Hives   Zetia [ezetimibe] Other (See Comments)   Reaction not recalled   Zocor [simvastatin - High Dose] Other (See Comments)   Reaction not recalled   Gatifloxacin Rash, Other (See Comments)   Redness to skin around eye        Medication List     STOP taking these medications    alendronate 70 MG tablet Commonly known as: FOSAMAX   ascorbic acid 500 MG tablet Commonly known as: VITAMIN C   cholestyramine 4 GM/DOSE powder Commonly known as: QUESTRAN   ciprofloxacin 500 MG tablet Commonly known as: CIPRO   cloNIDine 0.2 MG tablet Commonly known as: CATAPRES   doxycycline 100 MG tablet Commonly known as: VIBRA-TABS   Florastor 250  MG capsule Generic drug: saccharomyces boulardii   HYDROmorphone HCl 1 MG/ML Liqd Commonly known as: DILAUDID   mirabegron ER 25 MG Tb24 tablet Commonly known as: Myrbetriq   ondansetron 4 MG tablet Commonly known as: ZOFRAN   predniSONE 5 MG tablet Commonly known as: DELTASONE   sodium bicarbonate 650 MG tablet   spironolactone 25 MG tablet Commonly known as: ALDACTONE   Tresiba 100 UNIT/ML Soln Generic drug: Insulin Degludec       TAKE these medications    acetaminophen 325 MG tablet Commonly known as: TYLENOL Take 325 mg by mouth every 6  (six) hours as needed for mild pain or fever.   allopurinol 100 MG tablet Commonly known as: ZYLOPRIM Take 100 mg by mouth daily.   ALPRAZolam 0.25 MG tablet Commonly known as: XANAX Take 1 tablet (0.25 mg total) by mouth 2 (two) times daily as needed for anxiety or sleep. What changed:  medication strength how much to take when to take this reasons to take this   amLODipine 10 MG tablet Commonly known as: NORVASC TAKE 1 TABLET EVERY DAY   aspirin 81 MG chewable tablet 1 tablet Orally Once a day   atorvastatin 80 MG tablet Commonly known as: LIPITOR Take 1 tablet (80 mg total) by mouth daily at 6 PM. What changed: when to take this   B-D INS SYR ULTRAFINE .3CC/29G 29G X 1/2" 0.3 ML Misc Generic drug: Insulin Syringe-Needle U-100 Use for sliding scale for breakthrough high blood sugar readings   famotidine 20 MG tablet Commonly known as: PEPCID Take 1 tablet (20 mg total) by mouth at bedtime. What changed: when to take this   feeding supplement (GLUCERNA SHAKE) Liqd Take 237 mLs by mouth 3 (three) times daily between meals.   ferrous sulfate 325 (65 FE) MG tablet Take 1 tablet (325 mg total) by mouth daily with breakfast. Start taking on: September 06, 7857   folic acid 1 MG tablet Commonly known as: FOLVITE Take 1 mg by mouth daily.   furosemide 40 MG tablet Commonly known as: LASIX TAKE 2 TABLETS IN MORNING AND 1 AND 1/2 TABLETS IN EVENING. CHANGE IN DOSAGE. What changed: See the new instructions.   hydrALAZINE 100 MG tablet Commonly known as: APRESOLINE TAKE 1 TABLET THREE TIMES DAILY   insulin aspart 100 UNIT/ML injection Commonly known as: novoLOG Inject 2-8 Units into the skin 3 (three) times daily with meals. Per sliding scale, If blood sugar is 200 to 250, give 2 units. If blood sugar is 251 to 300, give 4 units. If blood sugar is 301 to 350, give 6 units. If blood sugar is greater than 350, give 8 units and call MD.   insulin glargine-yfgn 100  UNIT/ML injection Commonly known as: SEMGLEE Inject 0.1 mLs (10 Units total) into the skin at bedtime. What changed: when to take this   metoprolol succinate 25 MG 24 hr tablet Commonly known as: TOPROL-XL Take 1 tablet (25 mg total) by mouth daily. What changed: how much to take   ondansetron 4 MG disintegrating tablet Commonly known as: ZOFRAN-ODT Take 4 mg by mouth every 8 (eight) hours as needed for nausea or vomiting.   pantoprazole 40 MG tablet Commonly known as: PROTONIX Take 40 mg by mouth 2 (two) times daily.   simethicone 80 MG chewable tablet Commonly known as: Gas-X Chew 1 tablet (80 mg total) by mouth 4 (four) times daily as needed for flatulence.   tadalafil (PAH) 20 MG tablet Commonly known as:  ADCIRCA Take 2 tablets (40 mg total) by mouth daily. What changed: when to take this   Vitamin B12 1000 MCG Tbcr Take 1,000 mcg by mouth daily with lunch.   Vitamin D3 50 MCG (2000 UT) capsule Take 2,000 Units by mouth daily with lunch.   warfarin 5 MG tablet Commonly known as: COUMADIN Take as directed. If you are unsure how to take this medication, talk to your nurse or doctor. Original instructions: Take 1 tablet (5 mg total) by mouth See admin instructions. Take 1 tablet ever day except for on Sundays 1 and 1/2 tablets What changed: See the new instructions.        Contact information for follow-up providers     McKenzie, Candee Furbish, MD. Schedule an appointment as soon as possible for a visit in 1 week(s).   Specialty: Urology Why: Urinary retention--Foley in situ Contact information: [place] [address] [place], [place] Follow up.   Specialty: Home Health Services Why: Will contact you to schedule home health visits. Contact information: [place] [address] [phone]              Contact information for after-discharge care     Destination      [facility] Preferred SNF .   Service: Skilled Nursing Contact information: [address] [address] [phone]                    Allergies  Allergen Reactions   Flagyl [Metronidazole] Rash and Other (See Comments)    ALL-OVER BODY RASH   Augmentin [Amoxicillin-Pot Clavulanate] Diarrhea and Nausea And Vomiting   Ciprofloxacin Diarrhea, Nausea Only and Other (See Comments)    Stomach ache   Coreg [Carvedilol] Other (See Comments)    Terrible cramping in the feet and had a lot of bowel movements, but not diarrhea   Diflucan [Fluconazole] Diarrhea   Losartan Swelling and Other (See Comments)    Patient doesn't recall site of swelling   Privigen [Immune Globulin (Human)] Other (See Comments)    Severe/excruciating pain   Sulfamethoxazole-Trimethoprim Diarrhea, Nausea Only and Other (See Comments)    Stomach upset   Verapamil Hives   Zetia [Ezetimibe] Other (See Comments)    Reaction not recalled   Zocor [Simvastatin - High Dose] Other (See Comments)    Reaction not recalled   Gatifloxacin Rash and Other (See Comments)    Redness to skin around eye   Allergies as of 09/05/2022       Reactions   Flagyl [metronidazole] Rash, Other (See Comments)   ALL-OVER BODY RASH   Augmentin [amoxicillin-pot Clavulanate] Diarrhea, Nausea And Vomiting   Ciprofloxacin Diarrhea, Nausea Only, Other (See Comments)   Stomach ache   Coreg [carvedilol] Other (See Comments)   Terrible cramping in the feet and had a lot of bowel movements, but not diarrhea   Diflucan [fluconazole] Diarrhea   Losartan Swelling, Other (See Comments)   Patient doesn't recall site of swelling   Privigen [immune Globulin (human)] Other (See Comments)   Severe/excruciating pain   Sulfamethoxazole-trimethoprim Diarrhea, Nausea Only, Other (See Comments)   Stomach upset   Verapamil Hives   Zetia [ezetimibe] Other (See Comments)   Reaction not recalled   Zocor  [simvastatin - High Dose] Other (See Comments)   Reaction not recalled   Gatifloxacin Rash, Other (See Comments)  Redness to skin around eye        Medication List     STOP taking these medications    alendronate 70 MG tablet Commonly known as: FOSAMAX   ascorbic acid 500 MG tablet Commonly known as: VITAMIN C   cholestyramine 4 GM/DOSE powder Commonly known as: QUESTRAN   ciprofloxacin 500 MG tablet Commonly known as: CIPRO   cloNIDine 0.2 MG tablet Commonly known as: CATAPRES   doxycycline 100 MG tablet Commonly known as: VIBRA-TABS   Florastor 250 MG capsule Generic drug: saccharomyces boulardii   HYDROmorphone HCl 1 MG/ML Liqd Commonly known as: DILAUDID   mirabegron ER 25 MG Tb24 tablet Commonly known as: Myrbetriq   ondansetron 4 MG tablet Commonly known as: ZOFRAN   predniSONE 5 MG tablet Commonly known as: DELTASONE   sodium bicarbonate 650 MG tablet   spironolactone 25 MG tablet Commonly known as: ALDACTONE   Tresiba 100 UNIT/ML Soln Generic drug: Insulin Degludec       TAKE these medications    acetaminophen 325 MG tablet Commonly known as: TYLENOL Take 325 mg by mouth every 6 (six) hours as needed for mild pain or fever.   allopurinol 100 MG tablet Commonly known as: ZYLOPRIM Take 100 mg by mouth daily.   ALPRAZolam 0.25 MG tablet Commonly known as: XANAX Take 1 tablet (0.25 mg total) by mouth 2 (two) times daily as needed for anxiety or sleep. What changed:  medication strength how much to take when to take this reasons to take this   amLODipine 10 MG tablet Commonly known as: NORVASC TAKE 1 TABLET EVERY DAY   aspirin 81 MG chewable tablet 1 tablet Orally Once a day   atorvastatin 80 MG tablet Commonly known as: LIPITOR Take 1 tablet (80 mg total) by mouth daily at 6 PM. What changed: when to take this   B-D INS SYR ULTRAFINE .3CC/29G 29G X 1/2" 0.3 ML Misc Generic drug: Insulin Syringe-Needle U-100 Use for sliding  scale for breakthrough high blood sugar readings   famotidine 20 MG tablet Commonly known as: PEPCID Take 1 tablet (20 mg total) by mouth at bedtime. What changed: when to take this   feeding supplement (GLUCERNA SHAKE) Liqd Take 237 mLs by mouth 3 (three) times daily between meals.   ferrous sulfate 325 (65 FE) MG tablet Take 1 tablet (325 mg total) by mouth daily with breakfast. Start taking on: September 06, 8412   folic acid 1 MG tablet Commonly known as: FOLVITE Take 1 mg by mouth daily.   furosemide 40 MG tablet Commonly known as: LASIX TAKE 2 TABLETS IN MORNING AND 1 AND 1/2 TABLETS IN EVENING. CHANGE IN DOSAGE. What changed: See the new instructions.   hydrALAZINE 100 MG tablet Commonly known as: APRESOLINE TAKE 1 TABLET THREE TIMES DAILY   insulin aspart 100 UNIT/ML injection Commonly known as: novoLOG Inject 2-8 Units into the skin 3 (three) times daily with meals. Per sliding scale, If blood sugar is 200 to 250, give 2 units. If blood sugar is 251 to 300, give 4 units. If blood sugar is 301 to 350, give 6 units. If blood sugar is greater than 350, give 8 units and call MD.   insulin glargine-yfgn 100 UNIT/ML injection Commonly known as: SEMGLEE Inject 0.1 mLs (10 Units total) into the skin at bedtime. What changed: when to take this   metoprolol succinate 25 MG 24 hr tablet Commonly known as: TOPROL-XL Take 1 tablet (25 mg total) by mouth daily.  What changed: how much to take   ondansetron 4 MG disintegrating tablet Commonly known as: ZOFRAN-ODT Take 4 mg by mouth every 8 (eight) hours as needed for nausea or vomiting.   pantoprazole 40 MG tablet Commonly known as: PROTONIX Take 40 mg by mouth 2 (two) times daily.   simethicone 80 MG chewable tablet Commonly known as: Gas-X Chew 1 tablet (80 mg total) by mouth 4 (four) times daily as needed for flatulence.   tadalafil (PAH) 20 MG tablet Commonly known as: ADCIRCA Take 2 tablets (40 mg total) by  mouth daily. What changed: when to take this   Vitamin B12 1000 MCG Tbcr Take 1,000 mcg by mouth daily with lunch.   Vitamin D3 50 MCG (2000 UT) capsule Take 2,000 Units by mouth daily with lunch.   warfarin 5 MG tablet Commonly known as: COUMADIN Take as directed. If you are unsure how to take this medication, talk to your nurse or doctor. Original instructions: Take 1 tablet (5 mg total) by mouth See admin instructions. Take 1 tablet ever day except for on Sundays 1 and 1/2 tablets What changed: See the new instructions.        Procedures/Studies: DG CHEST PORT 1 VIEW  Result Date: 08/31/2022 CLINICAL DATA:  Increasing shortness of breath. EXAM: PORTABLE CHEST 1 VIEW COMPARISON:  08/30/2022 FINDINGS: The cardio pericardial silhouette is enlarged. Retrocardiac left base atelectasis or infiltrate again noted with probable tiny effusion. Interstitial markings are diffusely coarsened with chronic features. Interval development of focal airspace opacity in the parahilar right lung along the minor fissure. Pulmonary nodule in the right mid lung and left base are stable compatible with granulomas. IMPRESSION: 1. Interval development of focal airspace opacity in the parahilar right lung along the minor fissure. This could be atelectasis or pneumonia. 2. Retrocardiac left base atelectasis or infiltrate with probable tiny effusion. Electronically Signed   By: Misty Stanley M.D.   On: 08/31/2022 08:41   DG CHEST PORT 1 VIEW  Result Date: 08/30/2022 CLINICAL DATA:  Pulmonary edema. EXAM: PORTABLE CHEST 1 VIEW COMPARISON:  August 29, 2022. FINDINGS: Mild cardiomegaly is noted. Status post coronary bypass graft. Mild central pulmonary vascular congestion and bilateral pulmonary edema is noted with small bilateral pleural effusions. Bony thorax is unremarkable. IMPRESSION: Mild cardiomegaly with central pulmonary vascular congestion and bilateral pulmonary edema. Small bilateral pleural effusions  are noted. Aortic Atherosclerosis (ICD10-I70.0). Electronically Signed   By: Marijo Conception M.D.   On: 08/30/2022 09:23   DG CHEST PORT 1 VIEW  Result Date: 08/29/2022 CLINICAL DATA:  Shortness of breath EXAM: PORTABLE CHEST 1 VIEW COMPARISON:  Previous studies including the examination of 08/26/2022 FINDINGS: Transverse diameter of heart is increased. Central pulmonary vessels are prominent. There is prominence of interstitial markings in parahilar regions and lower lung fields. Small bilateral pleural effusions are noted. There is no pneumothorax. Metallic sutures are seen in the sternum. There are calcified nodules in right mid and left lower lung fields. IMPRESSION: Cardiomegaly. There is prominence of interstitial markings in the parahilar regions and lower lung fields suggesting mild interstitial edema or interstitial pneumonia. Small bilateral pleural effusions. Electronically Signed   By: Elmer Picker M.D.   On: 08/29/2022 13:18   CT ABDOMEN PELVIS WO CONTRAST  Result Date: 08/26/2022 CLINICAL DATA:  Bowel obstruction suspected EXAM: CT ABDOMEN AND PELVIS WITHOUT CONTRAST TECHNIQUE: Multidetector CT imaging of the abdomen and pelvis was performed following the standard protocol without IV contrast. RADIATION DOSE REDUCTION: This exam  was performed according to the departmental dose-optimization program which includes automated exposure control, adjustment of the mA and/or kV according to patient size and/or use of iterative reconstruction technique. COMPARISON:  08/17/2022 FINDINGS: Lower Chest: Small pleural effusions with bibasilar atelectasis. Unchanged left basilar calcification Hepatobiliary: Normal hepatic contours. No intra- or extrahepatic biliary dilatation. Status post cholecystectomy. Pancreas: Normal pancreas. No ductal dilatation or peripancreatic fluid collection. Spleen: Numerous calcified granulomata. Adrenals/Urinary Tract: The adrenal glands are normal. No hydronephrosis,  nephroureterolithiasis or solid renal mass. The urinary bladder is normal for degree of distention Stomach/Bowel: There is no hiatal hernia. Normal duodenal course and caliber. No small bowel dilatation or inflammation. Rectosigmoid diverticulosis without acute inflammation. The appendix is not visualized. No right lower quadrant inflammation or free fluid. Vascular/Lymphatic: There is calcific atherosclerosis of the abdominal aorta. No lymphadenopathy. Reproductive: Status post hysterectomy. No adnexal mass. Other: None. Musculoskeletal: Chronic compression deformity of L4 IMPRESSION: 1. No acute abnormality of the abdomen or pelvis. 2. Small pleural effusions with bibasilar atelectasis. 3. Rectosigmoid diverticulosis without acute inflammation. Aortic Atherosclerosis (ICD10-I70.0). Electronically Signed   By: Ulyses Jarred M.D.   On: 08/26/2022 23:43   DG Chest Port 1 View  Result Date: 08/26/2022 CLINICAL DATA:  Cough EXAM: PORTABLE CHEST 1 VIEW COMPARISON:  Chest x-ray 08/04/2022 FINDINGS: Patient is status post cardiac surgery. The heart is enlarged. There central pulmonary vascular congestion. Calcified granulomas are again seen bilaterally. There is no pleural effusion or pneumothorax. No acute fractures are seen. IMPRESSION: Cardiomegaly with central pulmonary vascular congestion. Electronically Signed   By: Ronney Asters M.D.   On: 08/26/2022 23:35   CT ABDOMEN PELVIS WO CONTRAST  Result Date: 08/17/2022 CLINICAL DATA:  Lower abdominal pain EXAM: CT ABDOMEN AND PELVIS WITHOUT CONTRAST TECHNIQUE: Multidetector CT imaging of the abdomen and pelvis was performed following the standard protocol without IV contrast. RADIATION DOSE REDUCTION: This exam was performed according to the departmental dose-optimization program which includes automated exposure control, adjustment of the mA and/or kV according to patient size and/or use of iterative reconstruction technique. COMPARISON:  07/08/2022 FINDINGS:  Lower chest: Dependent bibasilar airspace opacities, likely atelectasis. Trace bilateral effusions. Aortic atherosclerosis. Hepatobiliary: No focal liver abnormality is seen. Status post cholecystectomy. No biliary dilatation. Pancreas: No focal abnormality or ductal dilatation. Spleen: Scattered punctate calcifications compatible with old granulomas. Normal size. Adrenals/Urinary Tract: No adrenal abnormality. No focal renal abnormality. No stones or hydronephrosis. Urinary bladder is unremarkable. Stomach/Bowel: Extensive sigmoid diverticulosis. No active diverticulitis. Large stool burden in the colon. Stomach and small bowel decompressed, unremarkable. Previously seen inflammatory changes around the duodenum have resolved. Vascular/Lymphatic: Diffuse aortic and arterial atherosclerosis. No evidence of aneurysm or adenopathy. Reproductive: Prior hysterectomy.  No adnexal masses. Other: Trace free fluid in the pelvis.  No free air. Musculoskeletal: Stable mild compression fracture involving the L4 vertebral body. No acute bony abnormality. IMPRESSION: Improved inflammation around the duodenum from prior study. Extensive sigmoid diverticulosis.  No active diverticulitis. Large stool burden in the colon. Trace free fluid. Diffuse aortic/arterial atherosclerosis. Bibasilar atelectasis with trace bilateral effusions. Electronically Signed   By: Rolm Baptise M.D.   On: 08/17/2022 14:08     Subjective: Pt without complaint today.  She is agreeable to going to SNF rehab.    Discharge Exam: Vitals:   09/05/22 0317 09/05/22 0323  BP: (!) 177/59 (!) 146/62  Pulse: 88   Resp: 16   Temp: 98.7 F (37.1 C)   SpO2: 97%    Vitals:   09/04/22 2250  09/05/22 0317 09/05/22 0323 09/05/22 0500  BP: (!) 148/54 (!) 177/59 (!) 146/62   Pulse:  88    Resp:  16    Temp:  98.7 F (37.1 C)    TempSrc:  Oral    SpO2:  97%    Weight:    57.6 kg  Height:       Gen:- frail, elderly female, Awake Alert, no acute  distress HEENT:- NCAT, No sclera icterus, PERRL.  Neck-Supple, no thyroid masses palpated.  Lungs: good air movement, no crackles or wheeze heard, no increased work of breathing.   CV- normal S1, S2 sounds.   Abd-  normal active BS, Abd Soft, No tenderness, no HSM.  Extremity/Skin:- trace pretibial edema, pedal pulses present pedal edema nearly resolved Psych-affect is flat; oriented x3 Neuro-generalized weakness no new focal deficits, no tremors GU- foley placed, amber urine seen.    The results of significant diagnostics from this hospitalization (including imaging, microbiology, ancillary and laboratory) are listed below for reference.     Microbiology: No results found for this or any previous visit (from the past 240 hour(s)).   Labs: BNP (last 3 results) Recent Labs    04/12/22 1716 08/04/22 1658 08/27/22 0700  BNP 61.5 249.3* 465.0*   Basic Metabolic Panel: Recent Labs  Lab 08/30/22 0335 09/01/22 0429 09/03/22 0504 09/04/22 0554 09/05/22 0353  NA 135 133* 136 134* 134*  K 4.8 4.1 3.5 3.3* 3.2*  CL 106 101 101 101 100  CO2 '22 23 24 24 25  '$ GLUCOSE 184* 219* 89 105* 154*  BUN 42* 41* 42* 47* 51*  CREATININE 2.51* 2.20* 2.27* 2.50* 2.57*  CALCIUM 8.0* 8.1* 8.6* 8.5* 8.7*  MG  --  2.1  --  1.8  --   PHOS  --   --   --   --  3.6   Liver Function Tests: Recent Labs  Lab 09/05/22 0353  ALBUMIN 2.8*   No results for input(s): "LIPASE", "AMYLASE" in the last 168 hours. No results for input(s): "AMMONIA" in the last 168 hours. CBC: Recent Labs  Lab 08/30/22 0335 09/05/22 0353  WBC 9.2 6.5  NEUTROABS  --  2.5  HGB 9.7* 11.0*  HCT 31.3* 34.2*  MCV 96.9 91.9  PLT 204 178   Cardiac Enzymes: No results for input(s): "CKTOTAL", "CKMB", "CKMBINDEX", "TROPONINI" in the last 168 hours. BNP: Invalid input(s): "POCBNP" CBG: Recent Labs  Lab 09/04/22 1257 09/04/22 1711 09/04/22 1944 09/05/22 0738 09/05/22 1130  GLUCAP 155* 85 176* 110* 188*   D-Dimer No  results for input(s): "DDIMER" in the last 72 hours. Hgb A1c No results for input(s): "HGBA1C" in the last 72 hours. Lipid Profile No results for input(s): "CHOL", "HDL", "LDLCALC", "TRIG", "CHOLHDL", "LDLDIRECT" in the last 72 hours. Thyroid function studies No results for input(s): "TSH", "T4TOTAL", "T3FREE", "THYROIDAB" in the last 72 hours.  Invalid input(s): "FREET3" Anemia work up No results for input(s): "VITAMINB12", "FOLATE", "FERRITIN", "TIBC", "IRON", "RETICCTPCT" in the last 72 hours. Urinalysis    Component Value Date/Time   COLORURINE YELLOW 08/28/2022 1750   APPEARANCEUR CLEAR 08/28/2022 1750   LABSPEC 1.010 08/28/2022 1750   PHURINE 6.0 08/28/2022 1750   GLUCOSEU 50 (A) 08/28/2022 1750   HGBUR NEGATIVE 08/28/2022 1750   BILIRUBINUR NEGATIVE 08/28/2022 1750   BILIRUBINUR Negative 09/07/2021 1054   KETONESUR NEGATIVE 08/28/2022 1750   PROTEINUR >=300 (A) 08/28/2022 1750   UROBILINOGEN 0.2 09/07/2021 1054   UROBILINOGEN 1.0 10/06/2011 1709   NITRITE NEGATIVE 08/28/2022  [place] 08/28/2022 1750   Sepsis Labs Recent Labs  Lab 08/30/22 0335 09/05/22 0353  WBC 9.2 6.5   Microbiology No results found for this or any previous visit (from the past 240 hour(s)).  Time coordinating discharge: 48 mins   SIGNED:  Irwin Brakeman, MD  [facility] 09/05/2022, 12:31 PM How to contact the [facility] Attending or Consulting provider [place] or covering provider during after hours [place], for this patient?  Check the care team in [facility] and look for a) attending/consulting TRH provider listed and b) the [facility] team listed Log into [email or web address] and use [place]'s universal password to access. If you do not have the password, please contact the hospital operator. Locate the [place] provider you are looking for under [facility] and page to a number that you can be directly reached. If you still have difficulty reaching the provider, please page the  [facility] (Director on Call) for the Hospitalists listed on amion for assistance.

## 2022-09-05 NOTE — TOC Transition Note (Addendum)
Transition of Care [facility]) - CM/SW Discharge Note   Patient Details  Name: [name] MRN: [id number] Date of Birth: 07/07/1941  Transition of Care [facility]) CM/SW Contact:  [name], [place] Phone Number: 09/05/2022, 12:49 PM   Clinical Narrative: Pt d/c today to [facility]. Pt's daughter, [name] notified and reports her brother will provide transport this afternoon. Discussed palliative outpatient referral with [name] as recommended by palliative. [name] is agreeable to [facility] as [facility] recommended. SNF authorization received. Will give RN number to call report. D/C summary sent to SNF.     Final next level of care: Skilled Nursing Facility Barriers to Discharge: Barriers Resolved   Patient Goals and CMS Choice Patient states their goals for this hospitalization and ongoing recovery are:: return home   Choice offered to / list presented to : Adult Children  Discharge Placement              Patient chooses bed at: [facility] Patient to be transferred to facility by: family Name of family member notified: [name]- daughter Patient and family notified of of transfer: 09/05/22  Discharge Plan and Services In-house Referral: Clinical Social Work   Post Acute Care Choice: Resumption of Svcs/PTA Provider                    HH Arranged: PT, OT [place] Agency: [place] Date [place]: 08/30/22 Time [place]: 5027 Representative spoke with at [place]: [place] ([place]) Interventions     Readmission Risk Interventions    08/30/2022   11:42 AM 04/20/2022   12:02 PM  Readmission Risk Prevention Plan  Transportation Screening Complete Complete  Medication Review Press photographer) Complete Complete  PCP or Specialist appointment within 3-5 days of discharge  Complete  HRI or [place] Complete Complete  SW Recovery Care/Counseling Consult Complete Complete  Palliative Care  Screening Not Applicable Not [place] Not Applicable Not Applicable

## 2022-09-05 NOTE — Consult Note (Signed)
   [facility] Inpatient Consult   09/05/2022  LEILANY DIGERONIMO [birth year] [id number]  [facility] [ACO] Patient: [facility] Liaison remote coverage review for patient at [facility]   Primary Care Provider:  Osborne Casco, [name], MD   Patient screened for hospitalization with noted extreme high risk score for unplanned readmission risk length of stay to assess for potential [place] Management service needs for post hospital transition for care coordination.  Review of patient's electronic medical record reveals patient is transitioning to a skilled nursing facility for post hospital care.   Plan:  No [facility] Care Coordination needs assessed.  Of note, [facility] Care Management/Population Health does not replace or interfere with any arrangements made by the Inpatient Transition of Care team.  For questions contact:   Natividad Brood, RN BSN [place]  [phone] business mobile phone Toll free office [phone]  *[place]  [phone] Fax number: [phone] [email or web address]

## 2022-09-05 NOTE — Progress Notes (Signed)
Patient is in bed resting at this time. Patient has been given 2 prn medications during this shift. See MAR.

## 2022-09-05 NOTE — Consult Note (Signed)
Consultation Note Date: 09/05/2022   Patient Name: [name]  DOB: 02/04/1941  MRN: [id number]  Age / Sex: 81 y.o., female  PCP: [name], [name], MD Referring Physician: Murlean Iba, MD  Reason for Consultation:  "GOC- progressive CKD with symptoms (nausea)"  HPI/Patient Profile: 81 y.o. female  with past medical history of DM, autoimmune hemolytic anemia, CKD IV, ANCA vasculitis- previously on predisone, HFpEF, a fib on coumadin, s/p aortic valve replacement, history of diverticulitis, anemia related to renal insufficiency- receives epogen q 14 days- was last due on 11/22-  admitted on 08/26/2022 with complaints of constipation. She also has AKI on CKD, rash on legs presumed to be recurrent vasculitis. Admission complicated cardiorenal syndrome with volume overload requiring lasix, complicated by urinary retention and subsequent uptrend in serum Cr. Nephrology consulted - per their note patient Cr is at baseline- recommend continued monitoring and recheck of labs as well as foley placement and f/u with urology. Would not likely be a candidate for dialysis. Palliative consulted for above.     Primary Decision Maker NEXT OF KIN  Discussion: Extensive chart reviewed.  Patient was seen by Palliative last month- at that time she and family endorsed DNR with continued care to treat what is treatable.  Noted today she is stable with plans to discharge to [facility]. Has d/c orders in.  Attempted to evaluate patient- she was awaiting personal care and not able to participate in discussion at the time.  I called her listed contact Charlies Silvers. [name] was in middle of an appointment and could not have extended discussion. I recommended outpatient Palliative consult at facility and [name] is in agreement with this.     SUMMARY OF RECOMMENDATIONS -DNR -Treat what is treatable -TOC order placed for  outpatient Palliative to see at SNF    Code Status/Advance Care Planning: DNR   Prognosis:   Unable to determine  Discharge Planning: [place] for rehab with Palliative care service follow-up  Primary Diagnoses: Present on Admission:  Acute kidney injury superimposed on chronic kidney disease ([place])  Constipation  Nausea  Iron deficiency anemia  Hypoalbuminemia due to protein-calorie malnutrition ([place])  Type 2 diabetes mellitus with hyperglycemia ([place])  Chronic atrial fibrillation  Essential hypertension  GERD without esophagitis  Acute urinary retention  PAH (pulmonary artery hypertension) (HCC)  Moderate to severe pulmonary hypertension ([place])  Diabetic retinopathy associated with type 2 diabetes mellitus (HCC)  Anemia  Refractory anemia (HCC)  PAF (paroxysmal atrial fibrillation) ([place])  Acute on chronic heart failure with preserved ejection fraction (HFpEF) (HCC)   Review of Systems  Physical Exam  Vital Signs: BP (!) 146/62 (BP Location: Left Arm)   Pulse 88   Temp 98.7 F (37.1 C) (Oral)   Resp 16   Ht 5' (1.524 m)   Wt 57.6 kg   SpO2 97%   BMI 24.80 kg/m  Pain Scale: 0-10   Pain Score: 0-No pain   SpO2: SpO2: 97 % O2 Device:SpO2: 97 % O2 Flow Rate: .O2  Flow Rate (L/min): 2 L/min  IO: Intake/output summary:  Intake/Output Summary (Last 24 hours) at 09/05/2022 1218 Last data filed at 09/05/2022 0919 Gross per 24 hour  Intake 960 ml  Output 400 ml  Net 560 ml    LBM: Last BM Date : 09/04/22 Baseline Weight: Weight: 63 kg Most recent weight: Weight: 57.6 kg       Thank you for this consult. Palliative medicine will continue to follow and assist as needed.  Time Total: 60 minutes Greater than 50%  of this time was spent counseling and coordinating care related to the above assessment and plan.  Signed by: Mariana Kaufman, AGNP-C Palliative Medicine    Please contact Palliative Medicine Team phone at [phone] for questions and  concerns.  For individual provider: See Shea Evans

## 2022-09-06 ENCOUNTER — Telehealth: Payer: Self-pay | Admitting: *Deleted

## 2022-09-06 DIAGNOSIS — I5032 Chronic diastolic (congestive) heart failure: Secondary | ICD-10-CM | POA: Diagnosis not present

## 2022-09-06 DIAGNOSIS — I10 Essential (primary) hypertension: Secondary | ICD-10-CM | POA: Diagnosis not present

## 2022-09-06 DIAGNOSIS — I739 Peripheral vascular disease, unspecified: Secondary | ICD-10-CM | POA: Diagnosis not present

## 2022-09-06 DIAGNOSIS — D590 Drug-induced autoimmune hemolytic anemia: Secondary | ICD-10-CM | POA: Diagnosis not present

## 2022-09-06 DIAGNOSIS — E440 Moderate protein-calorie malnutrition: Secondary | ICD-10-CM | POA: Diagnosis not present

## 2022-09-06 DIAGNOSIS — E119 Type 2 diabetes mellitus without complications: Secondary | ICD-10-CM | POA: Diagnosis not present

## 2022-09-06 DIAGNOSIS — N179 Acute kidney failure, unspecified: Secondary | ICD-10-CM | POA: Diagnosis not present

## 2022-09-06 DIAGNOSIS — I5033 Acute on chronic diastolic (congestive) heart failure: Secondary | ICD-10-CM | POA: Diagnosis not present

## 2022-09-06 DIAGNOSIS — N184 Chronic kidney disease, stage 4 (severe): Secondary | ICD-10-CM | POA: Diagnosis not present

## 2022-09-06 LAB — COMPLEMENT, TOTAL: Compl, Total (CH50): >60 U/mL (ref >41)

## 2022-09-06 NOTE — Progress Notes (Signed)
  Care Coordination Note  09/06/2022 Name: DIDI GANAWAY MRN: [id number] DOB: [birth year]  BROOKLINN LONGBOTTOM is a 81 y.o. year old female who is a primary care patient of [name], [name], MD and is actively engaged with the care management team. I reached out to [name] by phone today to assist with re-scheduling a follow up visit with the RN Case Manager  Follow up plan: Unsuccessful telephone outreach attempt made.   [place]  Direct Dial: [phone]

## 2022-09-06 NOTE — Progress Notes (Signed)
Transferred to SNF per [place]  Direct Dial: [phone]

## 2022-09-07 DIAGNOSIS — E118 Type 2 diabetes mellitus with unspecified complications: Secondary | ICD-10-CM | POA: Diagnosis not present

## 2022-09-07 DIAGNOSIS — D509 Iron deficiency anemia, unspecified: Secondary | ICD-10-CM | POA: Diagnosis not present

## 2022-09-07 DIAGNOSIS — N189 Chronic kidney disease, unspecified: Secondary | ICD-10-CM | POA: Diagnosis not present

## 2022-09-07 LAB — ANCA TITERS
Atypical P-ANCA titer: <1:20 {titer}
C-ANCA: <1:20 {titer}
P-ANCA: <1:20 {titer}

## 2022-09-08 ENCOUNTER — Telehealth: Payer: Self-pay

## 2022-09-08 DIAGNOSIS — K219 Gastro-esophageal reflux disease without esophagitis: Secondary | ICD-10-CM | POA: Diagnosis not present

## 2022-09-08 DIAGNOSIS — I739 Peripheral vascular disease, unspecified: Secondary | ICD-10-CM | POA: Diagnosis not present

## 2022-09-08 DIAGNOSIS — I5033 Acute on chronic diastolic (congestive) heart failure: Secondary | ICD-10-CM | POA: Diagnosis not present

## 2022-09-08 DIAGNOSIS — I10 Essential (primary) hypertension: Secondary | ICD-10-CM | POA: Diagnosis not present

## 2022-09-08 NOTE — Telephone Encounter (Signed)
Pt discharged from hospital on 09/05/22 to [facility]. Called and confirmed with Aldona Bar, LPN that the INR/Warfarin will be managed at the facility.

## 2022-09-12 ENCOUNTER — Ambulatory Visit (INDEPENDENT_AMBULATORY_CARE_PROVIDER_SITE_OTHER): Payer: Medicare HMO | Admitting: Podiatry

## 2022-09-12 ENCOUNTER — Encounter: Payer: Self-pay | Admitting: Podiatry

## 2022-09-12 VITALS — BP 150/60

## 2022-09-12 DIAGNOSIS — E1151 Type 2 diabetes mellitus with diabetic peripheral angiopathy without gangrene: Secondary | ICD-10-CM

## 2022-09-12 DIAGNOSIS — R338 Other retention of urine: Secondary | ICD-10-CM | POA: Diagnosis not present

## 2022-09-12 DIAGNOSIS — B351 Tinea unguium: Secondary | ICD-10-CM

## 2022-09-12 DIAGNOSIS — M79675 Pain in left toe(s): Secondary | ICD-10-CM

## 2022-09-12 DIAGNOSIS — M79674 Pain in right toe(s): Secondary | ICD-10-CM

## 2022-09-12 DIAGNOSIS — I10 Essential (primary) hypertension: Secondary | ICD-10-CM | POA: Diagnosis not present

## 2022-09-12 DIAGNOSIS — I482 Chronic atrial fibrillation, unspecified: Secondary | ICD-10-CM | POA: Diagnosis not present

## 2022-09-12 NOTE — Progress Notes (Signed)
  Subjective:  Patient ID: [name], female    DOB: 06/17/1941,  MRN: [id number]  [name] presents to clinic today for at risk foot care. Pt has h/o NIDDM with PAD and painful thick toenails that are difficult to trim. Pain interferes with ambulation. Aggravating factors include wearing enclosed shoe gear. Pain is relieved with periodic professional debridement.  Chief Complaint  Patient presents with   Nail Problem    DFC BS- do not know for sure? Maybe 108 A1C- Do not know PCP-[name] PCP VST-08/16/2022   Her son is present during today's visit. [name] at [facility]. Currently wearing a foley catheter.  New problem(s): None.   PCP is [name], [name], MD.  Allergies  Allergen Reactions   Flagyl [Metronidazole] Rash and Other (See Comments)    ALL-OVER BODY RASH   Augmentin [Amoxicillin-Pot Clavulanate] Diarrhea and Nausea And Vomiting   Ciprofloxacin Diarrhea, Nausea Only and Other (See Comments)    Stomach ache   Coreg [Carvedilol] Other (See Comments)    Terrible cramping in the feet and had a lot of bowel movements, but not diarrhea   Diflucan [Fluconazole] Diarrhea   Losartan Swelling and Other (See Comments)    Patient doesn't recall site of swelling   Privigen [Immune Globulin (Human)] Other (See Comments)    Severe/excruciating pain   Sulfamethoxazole-Trimethoprim Diarrhea, Nausea Only and Other (See Comments)    Stomach upset   Verapamil Hives   Zetia [Ezetimibe] Other (See Comments)    Reaction not recalled   Zocor [Simvastatin - High Dose] Other (See Comments)    Reaction not recalled   Gatifloxacin Rash and Other (See Comments)    Redness to skin around eye    Review of Systems:  Objective:  Vitals:   09/12/22 1104  BP: (!) 150/60   [name] is a pleasant 81 y.o. female thin build in NAD. AAO x 3.  Vascular CFT <3 seconds b/l LE. No pain with calf compression b/l. Lower extremity skin temperature gradient within  normal limits. Faintly palpable DP/PT b/l.  Edema nearly resolved b/l LE. No ischemia or gangrene noted b/l LE. No cyanosis or clubbing noted b/l LE.  Neurologic Normal speech. Oriented to person, place, and time. Protective sensation intact 5/5 intact bilaterally with 10g monofilament b/l. Vibratory sensation intact b/l.  Dermatologic Pedal skin thin, shiny and atrophic b/l LE. No open wounds b/l LE. No interdigital macerations noted b/l LE. Toenails 1-5 bilaterally elongated, discolored, dystrophic, thickened, and crumbly with subungual debris and tenderness to dorsal palpation. I  Orthopedic: Muscle strength 5/5 to all lower extremity muscle groups bilaterally. No pain, crepitus or joint limitation noted with ROM bilateral LE. No gross bony deformities bilaterally. Tailor's bunion deformity noted b/l LE. Utilizes wheelchair for mobility assistance.   Radiographs: None  Assessment/Plan: 1. Pain due to onychomycosis of toenails of both feet   2. Type II diabetes mellitus with peripheral circulatory disorder (HCC)     No orders of the defined types were placed in this encounter.  -Patient's family member present. All questions/concerns addressed on today's visit. -Continue foot and shoe inspections daily. Monitor blood glucose per PCP/Endocrinologist's recommendations. -Continue supportive shoe gear daily. -Mycotic toenails 1-5 bilaterally were debrided in length and girth with sterile nail nippers and dremel without incident. -Patient/POA to call should there be question/concern in the interim.   Return in about 3 months (around 12/12/2022).  Marzetta Board, DPM

## 2022-09-13 DIAGNOSIS — I5032 Chronic diastolic (congestive) heart failure: Secondary | ICD-10-CM | POA: Diagnosis not present

## 2022-09-13 DIAGNOSIS — E440 Moderate protein-calorie malnutrition: Secondary | ICD-10-CM | POA: Diagnosis not present

## 2022-09-13 DIAGNOSIS — I5033 Acute on chronic diastolic (congestive) heart failure: Secondary | ICD-10-CM | POA: Diagnosis not present

## 2022-09-13 DIAGNOSIS — I739 Peripheral vascular disease, unspecified: Secondary | ICD-10-CM | POA: Diagnosis not present

## 2022-09-13 DIAGNOSIS — N184 Chronic kidney disease, stage 4 (severe): Secondary | ICD-10-CM | POA: Diagnosis not present

## 2022-09-13 DIAGNOSIS — N179 Acute kidney failure, unspecified: Secondary | ICD-10-CM | POA: Diagnosis not present

## 2022-09-13 DIAGNOSIS — E119 Type 2 diabetes mellitus without complications: Secondary | ICD-10-CM | POA: Diagnosis not present

## 2022-09-13 DIAGNOSIS — I10 Essential (primary) hypertension: Secondary | ICD-10-CM | POA: Diagnosis not present

## 2022-09-13 DIAGNOSIS — D590 Drug-induced autoimmune hemolytic anemia: Secondary | ICD-10-CM | POA: Diagnosis not present

## 2022-09-14 DIAGNOSIS — I5032 Chronic diastolic (congestive) heart failure: Secondary | ICD-10-CM | POA: Diagnosis not present

## 2022-09-14 DIAGNOSIS — I482 Chronic atrial fibrillation, unspecified: Secondary | ICD-10-CM | POA: Diagnosis not present

## 2022-09-15 ENCOUNTER — Inpatient Hospital Stay: Payer: Medicare HMO

## 2022-09-15 ENCOUNTER — Inpatient Hospital Stay: Payer: Medicare HMO | Admitting: Adult Health

## 2022-09-15 DIAGNOSIS — I482 Chronic atrial fibrillation, unspecified: Secondary | ICD-10-CM | POA: Diagnosis not present

## 2022-09-15 DIAGNOSIS — R338 Other retention of urine: Secondary | ICD-10-CM | POA: Diagnosis not present

## 2022-09-18 ENCOUNTER — Other Ambulatory Visit: Payer: Self-pay

## 2022-09-18 ENCOUNTER — Inpatient Hospital Stay: Payer: Medicare HMO

## 2022-09-18 ENCOUNTER — Inpatient Hospital Stay: Payer: Medicare HMO | Attending: Oncology | Admitting: Adult Health

## 2022-09-18 VITALS — BP 145/63 | HR 73 | Temp 97.7°F | Resp 16 | Ht 60.0 in | Wt 120.4 lb

## 2022-09-18 DIAGNOSIS — M8580 Other specified disorders of bone density and structure, unspecified site: Secondary | ICD-10-CM | POA: Insufficient documentation

## 2022-09-18 DIAGNOSIS — D638 Anemia in other chronic diseases classified elsewhere: Secondary | ICD-10-CM

## 2022-09-18 DIAGNOSIS — Z7901 Long term (current) use of anticoagulants: Secondary | ICD-10-CM | POA: Insufficient documentation

## 2022-09-18 DIAGNOSIS — D591 Autoimmune hemolytic anemia, unspecified: Secondary | ICD-10-CM | POA: Insufficient documentation

## 2022-09-18 DIAGNOSIS — I482 Chronic atrial fibrillation, unspecified: Secondary | ICD-10-CM | POA: Diagnosis not present

## 2022-09-18 DIAGNOSIS — I7782 Antineutrophilic cytoplasmic antibody (ANCA) vasculitis: Secondary | ICD-10-CM | POA: Diagnosis present

## 2022-09-18 DIAGNOSIS — I130 Hypertensive heart and chronic kidney disease with heart failure and stage 1 through stage 4 chronic kidney disease, or unspecified chronic kidney disease: Secondary | ICD-10-CM | POA: Insufficient documentation

## 2022-09-18 DIAGNOSIS — N184 Chronic kidney disease, stage 4 (severe): Secondary | ICD-10-CM | POA: Insufficient documentation

## 2022-09-18 DIAGNOSIS — N179 Acute kidney failure, unspecified: Secondary | ICD-10-CM | POA: Diagnosis not present

## 2022-09-18 DIAGNOSIS — E1122 Type 2 diabetes mellitus with diabetic chronic kidney disease: Secondary | ICD-10-CM | POA: Diagnosis not present

## 2022-09-18 DIAGNOSIS — D594 Other nonautoimmune hemolytic anemias: Secondary | ICD-10-CM

## 2022-09-18 LAB — CMP (CANCER CENTER ONLY)
ALT: 23 U/L (ref 0–44)
AST: 53 U/L — ABNORMAL HIGH (ref 15–41)
Albumin: 3.6 g/dL (ref 3.5–5.0)
Alkaline Phosphatase: 157 U/L — ABNORMAL HIGH (ref 38–126)
Anion gap: 9 (ref 5–15)
BUN: 59 mg/dL — ABNORMAL HIGH (ref 8–23)
CO2: 25 mmol/L (ref 22–32)
Calcium: 9.3 mg/dL (ref 8.9–10.3)
Chloride: 98 mmol/L (ref 98–111)
Creatinine: 2.25 mg/dL — ABNORMAL HIGH (ref 0.44–1.00)
GFR, Estimated: 21 mL/min — ABNORMAL LOW (ref >60)
Glucose, Bld: 223 mg/dL — ABNORMAL HIGH (ref 70–99)
Potassium: 4.2 mmol/L (ref 3.5–5.1)
Sodium: 132 mmol/L — ABNORMAL LOW (ref 135–145)
Total Bilirubin: 0.7 mg/dL (ref 0.3–1.2)
Total Protein: 5.9 g/dL — ABNORMAL LOW (ref 6.5–8.1)

## 2022-09-18 LAB — CBC WITH DIFFERENTIAL (CANCER CENTER ONLY)
Abs Immature Granulocytes: 0.02 10*3/uL (ref 0.00–0.07)
Basophils Absolute: 0.1 10*3/uL (ref 0.0–0.1)
Basophils Relative: 2 %
Eosinophils Absolute: 0 10*3/uL (ref 0.0–0.5)
Eosinophils Relative: 1 %
HCT: 37.6 % (ref 36.0–46.0)
Hemoglobin: 12.2 g/dL (ref 12.0–15.0)
Immature Granulocytes: 1 %
Lymphocytes Relative: 44 %
Lymphs Abs: 1.8 10*3/uL (ref 0.7–4.0)
MCH: 30 pg (ref 26.0–34.0)
MCHC: 32.4 g/dL (ref 30.0–36.0)
MCV: 92.4 fL (ref 80.0–100.0)
Monocytes Absolute: 0.6 10*3/uL (ref 0.1–1.0)
Monocytes Relative: 15 %
Neutro Abs: 1.5 10*3/uL — ABNORMAL LOW (ref 1.7–7.7)
Neutrophils Relative %: 37 %
Platelet Count: 211 10*3/uL (ref 150–400)
RBC: 4.07 MIL/uL (ref 3.87–5.11)
RDW: 15.8 % — ABNORMAL HIGH (ref 11.5–15.5)
WBC Count: 4.1 10*3/uL (ref 4.0–10.5)
nRBC: 0 % (ref 0.0–0.2)

## 2022-09-18 LAB — RETIC PANEL
Immature Retic Fract: 10.5 % (ref 2.3–15.9)
RBC.: 4.08 MIL/uL (ref 3.87–5.11)
Retic Count, Absolute: 104.9 10*3/uL (ref 19.0–186.0)
Retic Ct Pct: 2.6 % (ref 0.4–3.1)
Reticulocyte Hemoglobin: 32.3 pg (ref >27.9)

## 2022-09-18 LAB — SAMPLE TO BLOOD BANK

## 2022-09-18 LAB — PROTIME-INR
INR: 1.8 — ABNORMAL HIGH (ref 0.8–1.2)
Prothrombin Time: 20.4 seconds — ABNORMAL HIGH (ref 11.4–15.2)

## 2022-09-18 NOTE — Progress Notes (Unsigned)
[name] Cancer Follow up:    [name], [name], MD [place] [address]   DIAGNOSIS: multifactorial anemia  SUMMARY OF HEMATOLOGIC HISTORY: [name] is a 81 y.o. [place] woman with multifactorial anemia, as follows:             (a) anemia of chronic inflammation: As of May 2020 she has a SED rate of 95, CRP 1.1, positive ANA and RF; subsequently she was found to be ANCA positive             (b) hemolysis (mild): she has a mildly elevated LDH and a DAT positive for complement, not IgG; this is c/w (a) above             (c) anemia of renal insufficiency: inadequate EPO resonse to anemia                         (d) GIB/ iron deficiency in patient on lifelong anticoagulaton   (1) Feraheme received 03/01/2019, repeated 03/20/2019              (a) reticulocyte 03/07/2019 up from 43.4 a year ago to 191.8 after iron infusion             (b) feraheme repeated on 05/12/2019             (c) subsequent ferritin levels >100   (2) darbepoetin: starting 05/05/2019 at 181mg given every 2 weeks             (a) dose increased to 200 mcg every week starting 07/01/2019              (b) back to every 2 weeks beginning 08/04/2019 still at 200 mcg dose             (c) switched to Retacrit every 4 weeks as of 08/18/2019   (3) ANCA positive vasculitis: diagnosed 05/08/2019 (titer 1:640)             (a) prednisone 40 mg daily started 05/31/2019             (b) rituximab weekly started 06/12/2019 (received test dose 06/05/2019)                         (i) hepatitis B sAg, core Ab and HIV negative 05/08/2019                         (ii) rituximab held after 07/14/2019 dose (received 5 weekly doses) with neutropenia                         (iii) rituximab resumed 08/18/2019 but changed to monthly                         (iv) rituximab changed to every 2 months beginning with 02/02/2020 dose                         (v) rituximab changed to every 12 weeks after the  10/12/2020 dose             (c) prednisone tapered to off as of 09/15/2019                          (4) Anemia worsened in spring 2023,  refractory to Rituximab and requiring prednisone restart with slow taper finally stopping therapy 08/18/2022  A-Retacrit restarted on 08/01/2022-receives 40,000 units every 2 weeks.     (5) osteopenia with a T score of -1.5 on bone density 02/28/2016 at the [facility]  CURRENT THERAPY: Retacrit  INTERVAL HISTORY: [name] 81 y.o. female returns for f/u of her anemia.  She was recently hospitalized for dehydration and significant constipation.  She was on observation and then her bladder had difficulty working.  After admission she underwent further testing and has experienced another round of constipation.  On Saturday she received an enema that worked the following day and she had a very large BM.  She still feels swollen in her abdomen.    She denies any blood in her urine or stool.  She is staying in rehab at [facility].    For her multifactorial anemia we are following her hemoglobin which has been as follows over the past 2 months:   Latest Reference Range & Units 08/11/22 10:04 09/05/22 03:53 09/18/22 14:16  Hemoglobin 12.0 - 15.0 g/dL 9.9 (L) 11.0 (L) 12.2  (LL): Data is critically low (L): Data is abnormally low  Patient Active Problem List   Diagnosis Date Noted   Acute on chronic heart failure with preserved ejection fraction (HFpEF) ([place]) 08/29/2022   Acute exacerbation of CHF (congestive heart failure) ([place]) 08/29/2022   Acute urinary retention 08/28/2022   Acute kidney injury superimposed on chronic kidney disease ([place]) 08/27/2022   Constipation 08/27/2022   Nausea 08/27/2022   Hypoalbuminemia due to protein-calorie malnutrition ([place]) 08/27/2022   Type 2 diabetes mellitus with hyperglycemia ([place]) 08/27/2022   Palliative care encounter    Goals of care, counseling/discussion    Counseling and coordination of care    GI bleed  07/09/2022   GI bleeding 07/08/2022   Acute on chronic anemia 07/08/2022   History of Clostridioides difficile colitis 07/08/2022   DNR (do not resuscitate)/DNI(Do Not Intubate) 07/08/2022   Anemia of chronic disease 07/07/2022   Pressure injury of skin 06/23/2022   UTI (urinary tract infection) 06/22/2022   Joint pain 06/09/2022   Parietoalveolar pneumopathy ([place]) 06/09/2022   Primary osteoarthritis 06/09/2022   Rheumatoid factor positive 06/09/2022   C. difficile colitis 04/19/2022   Colitis 04/18/2022   PAH (pulmonary artery hypertension) ([place]) 04/18/2022   Emphysematous cystitis 04/13/2022   Female bladder prolapse 04/13/2022   Diverticulitis 04/12/2022   Autoimmune hemolytic anemia (HCC) 01/02/2022   Bilateral lower extremity edema 01/02/2022   Refractory anemia ([place]) 12/15/2021   Abnormal chest x-ray 12/15/2021   Supratherapeutic INR 12/15/2021   Elevated troponin 12/15/2021   Unilateral primary osteoarthritis, right hip 05/31/2021   Midline cystocele 05/30/2021   Primary localized osteoarthritis of pelvic region and thigh 05/30/2021   Vaginal pain 05/30/2021   Asymmetric SNHL (sensorineural hearing loss) 01/06/2021   Referred otalgia of both ears 01/06/2021   Bilateral hearing loss 08/31/2020   Low back pain 02/20/2020   CKD (chronic kidney disease), stage IV ([place]) 10/13/2019   Leukopenia due to antineoplastic chemotherapy ([place]) 08/18/2019   Thrombophilia ([place]) 06/19/2019   Encounter for general adult medical examination without abnormal findings 06/13/2019   Arteritis ([place]) 06/06/2019   Hypertensive heart and chronic kidney disease with heart failure and stage 1 through stage 4 chronic kidney disease, or unspecified chronic kidney disease ([place]) 06/06/2019   Muscle weakness 06/06/2019   Pancytopenia ([place]) 05/28/2019   Dyspnea 05/28/2019   Vasculitis, ANCA positive 05/28/2019   Disorder of  connective tissue ([place]) 05/14/2019   Renal insufficiency 05/08/2019   PAF  (paroxysmal atrial fibrillation) ([place]) 04/07/2019   Iron deficiency anemia 02/28/2019   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 ([place]) 02/28/2019   Diabetic retinopathy associated with type 2 diabetes mellitus ([place]) 09/02/2018   Chronic pain 06/19/2018   Non-thrombocytopenic purpura ([place]) 05/21/2018   Gout 03/22/2018   Malnutrition of moderate degree 03/12/2018   Diabetes mellitus type 2, controlled ([place]) 03/06/2018   Chronic atrial fibrillation 03/06/2018   H/O mechanical aortic valve replacement 03/06/2018   Moderate to severe pulmonary hypertension ([place]) 03/06/2018   GERD without esophagitis 02/21/2018   Anxiety 02/21/2018   Chronic diastolic CHF (congestive heart failure) ([place]) 02/21/2018   General weakness 02/02/2018   Thrombocytopenia ([place]) 01/25/2018   Arm pain, diffuse 01/25/2018   Anemia 01/23/2018   Aortic atherosclerosis ([place]) 01/23/2018   Age-related osteoporosis without current pathological fracture 05/24/2017   Carotid artery occlusion 05/24/2017   Generalized anxiety disorder 05/24/2017   Hearing loss 05/24/2017   Presence of prosthetic heart valve 05/24/2017   Carotid artery disease ([place]) 08/18/2013   Peripheral arterial disease ([place]) 08/18/2013   Diabetes mellitus without complication ([place]) 56/31/4970   Essential hypertension    Hypercholesteremia    Mechanical heart valve present    Chronic anticoagulation     is allergic to flagyl [metronidazole], augmentin [amoxicillin-pot clavulanate], ciprofloxacin, coreg [carvedilol], diflucan [fluconazole], losartan, privigen [immune globulin (human)], sulfamethoxazole-trimethoprim, verapamil, zetia [ezetimibe], zocor [simvastatin - high dose], and gatifloxacin.  MEDICAL HISTORY: Past Medical History:  Diagnosis Date   Aortic atherosclerosis ([place]) 01/23/2018   Atrial fibrillation (HCC)    Benign positional vertigo 10/06/2011   Carotid artery disease (HCC)    Chemotherapy induced neutropenia ([place])  07/21/2019   Cholelithiasis 01/23/2018   Cholelithiasis 01/23/2018   Chronic anticoagulation    Colovesical fistula, ruled out 04/14/2022   Coronary artery disease    status post coronary artery bypass grafting times 07/10/2004   Diabetes mellitus    Hypercholesteremia    Hypertension    Mechanical heart valve present    H. aortic valve replacement at the time of bypass surgery October 2005   Moderate to severe pulmonary hypertension ([place])    Peripheral arterial disease ([place])    history of left common iliac artery PTA and stenting for a chronic total occlusion 08/26/01   S/P cholecystectomy 03/06/2018    SURGICAL HISTORY: Past Surgical History:  Procedure Laterality Date   anterior repair  2009   AORTIC VALVE REPLACEMENT  2005   Dr. Cyndia Bent   CARDIAC CATHETERIZATION  11/10/2004   40% right common illiac, 70% in stent restenosis of distal left common illiac,    CARDIAC CATHETERIZATION  05/18/2004   LAD 50-70% midstenosis, RCA dominant w/50% stenosis, 50% Right common Illiac artery ostial stenosis, 90% in stent restenosis within midportion of left common illiac stent   Carotid Duplex  03/12/2012   RSA-elev. velocities suggestive of a 50-69% diameter reduction, Right&Left Bulb/Prox ICA-mild-mod.fibrous plaqueelevating Velocities abnormal study.   CHOLECYSTECTOMY N/A 03/01/2018   Procedure: LAPAROSCOPIC CHOLECYSTECTOMY;  Surgeon: Judeth Horn, MD;  Location: [facility];  Service: General;  Laterality: N/A;   COLONOSCOPY WITH PROPOFOL N/A 01/22/2018   Procedure: COLONOSCOPY WITH PROPOFOL;  Surgeon: Wilford Corner, MD;  Location: [facility];  Service: Endoscopy;  Laterality: N/A;   ESOPHAGOGASTRODUODENOSCOPY N/A 07/13/2022   Procedure: ESOPHAGOGASTRODUODENOSCOPY (EGD);  Surgeon: [name], MD;  Location: [facility];  Service: Gastroenterology;  Laterality: N/A;   ESOPHAGOGASTRODUODENOSCOPY (EGD) WITH PROPOFOL N/A 01/22/2018  Procedure: ESOPHAGOGASTRODUODENOSCOPY (EGD) WITH PROPOFOL;   Surgeon: Wilford Corner, MD;  Location: [facility];  Service: Endoscopy;  Laterality: N/A;   ESOPHAGOGASTRODUODENOSCOPY (EGD) WITH PROPOFOL N/A 11/21/2018   Procedure: ESOPHAGOGASTRODUODENOSCOPY (EGD) WITH PROPOFOL;  Surgeon: [name], MD;  Location: [facility];  Service: Gastroenterology;  Laterality: N/A;   ESOPHAGOGASTRODUODENOSCOPY (EGD) WITH PROPOFOL Left 02/27/2019   Procedure: ESOPHAGOGASTRODUODENOSCOPY (EGD) WITH PROPOFOL;  Surgeon: Arta Silence, MD;  Location[facility];  Service: Endoscopy;  Laterality: Left;   GIVENS CAPSULE STUDY N/A 11/21/2018   Procedure: GIVENS CAPSULE STUDY;  Surgeon: [name], MD;  Location: [facility];  Service: Gastroenterology;  Laterality: N/A;  To be deployed during EGD   Lower Ext. Duplex  03/12/2012   Right Proximal CIA- vessel narrowing w/elevated velocities 0-49% diameter reduction. Right SFA-mild mixed density plaque throughout vessel.   NM MYOCAR PERF WALL MOTION  05/19/2010   protocol: Persantine, post stress EF 65%, negative for ischemia, low risk scan   RIGHT HEART CATH N/A 06/27/2018   Procedure: RIGHT HEART CATH;  Surgeon: [name], MD;  Location: [facility];  Service: Cardiovascular;  Laterality: N/A;   TOTAL ABDOMINAL HYSTERECTOMY W/ BILATERAL SALPINGOOPHORECTOMY  1989   TRANSTHORACIC ECHOCARDIOGRAM  08/29/2012   Moderately calcified annulus of mitral valve, moderate regurg. of both mitral valve and tricuspid valve.     SOCIAL HISTORY: Social History   Socioeconomic History   Marital status: Widowed    Spouse name: Not on file   Number of children: Not on file   Years of education: Not on file   Highest education level: Not on file  Occupational History   Occupation: Retired  Tobacco Use   Smoking status: Former    Packs/day: 1.00    Years: 30.00    Total pack years: 30.00    Types: Cigarettes   Smokeless tobacco: Never   Tobacco comments:    quit smoking 2005  Vaping Use   Vaping Use: Never  used  Substance and Sexual Activity   Alcohol use: No    Alcohol/week: 0.0 standard drinks of alcohol   Drug use: No   Sexual activity: Not Currently    Birth control/protection: None  Other Topics Concern   Not on file  Social History Narrative   Not on file   Social Determinants of Health   Financial Resource Strain: Low Risk  (02/28/2022)   Overall Financial Resource Strain (CARDIA)    Difficulty of Paying Living Expenses: Not hard at all  Food Insecurity: No Food Insecurity (08/31/2022)   Hunger Vital Sign    Worried About Running Out of Food in the Last Year: Never true    [place] in the Last Year: Never true  Transportation Needs: No Transportation Needs (08/31/2022)   PRAPARE - Hydrologist (Medical): No    Lack of Transportation (Non-Medical): No  Physical Activity: Inactive (02/28/2022)   Exercise Vital Sign    Days of Exercise per Week: 0 days    Minutes of Exercise per Session: 0 min  Stress: No Stress Concern Present (02/28/2022)   [place]    Feeling of Stress : Only a little  Social Connections: Moderately Integrated (06/22/2022)   Social Connection and Isolation Panel [NHANES]    Frequency of Communication with Friends and Family: More than three times a week    Frequency of Social Gatherings with Friends and Family: More than three times a week  Attends Religious Services: More than 4 times per year    Active Member of Clubs or Organizations: Yes    Attends Archivist Meetings: More than 4 times per year    Marital Status: Widowed  Intimate Partner Violence: Not At Risk (08/31/2022)   Humiliation, Afraid, Rape, and Kick questionnaire    Fear of Current or Ex-Partner: No    Emotionally Abused: No    Physically Abused: No    Sexually Abused: No    FAMILY HISTORY: Family History  Problem Relation Age of Onset   Heart disease Father     Hypertension Father    Hyperlipidemia Father    Breast cancer Neg Hx     Review of Systems  Constitutional:  Positive for fatigue. Negative for appetite change, chills, fever and unexpected weight change.  HENT:   Negative for hearing loss, lump/mass and trouble swallowing.   Eyes:  Negative for eye problems and icterus.  Respiratory:  Negative for chest tightness, cough and shortness of breath.   Cardiovascular:  Negative for chest pain, leg swelling and palpitations.  Gastrointestinal:  Positive for constipation. Negative for abdominal distention, abdominal pain, blood in stool, diarrhea, nausea and vomiting.  Endocrine: Negative for hot flashes.  Genitourinary:  Negative for difficulty urinating.   Musculoskeletal:  Negative for arthralgias.  Skin:  Negative for itching and rash.  Neurological:  Negative for dizziness, extremity weakness, headaches and numbness.  Hematological:  Negative for adenopathy. Does not bruise/bleed easily.  Psychiatric/Behavioral:  Negative for depression. The patient is not nervous/anxious.       PHYSICAL EXAMINATION  ECOG PERFORMANCE STATUS: 2 - Symptomatic, <50% confined to bed  Vitals:   09/18/22 1436  BP: (!) 145/63  Pulse: 73  Resp: 16  Temp: 97.7 F (36.5 C)  SpO2: 100%    Physical Exam Constitutional:      General: She is not in acute distress.    Appearance: Normal appearance. She is not toxic-appearing.  HENT:     Head: Normocephalic and atraumatic.  Eyes:     General: No scleral icterus. Cardiovascular:     Rate and Rhythm: Normal rate and regular rhythm.     Pulses: Normal pulses.     Heart sounds: Normal heart sounds.  Pulmonary:     Effort: Pulmonary effort is normal.     Breath sounds: Normal breath sounds.  Abdominal:     General: Abdomen is flat. Bowel sounds are normal. There is no distension.     Palpations: Abdomen is soft.     Tenderness: There is no abdominal tenderness.  Musculoskeletal:        General: No  swelling.     Cervical back: Neck supple.  Lymphadenopathy:     Cervical: No cervical adenopathy.  Skin:    General: Skin is warm and dry.     Findings: No rash.  Neurological:     General: No focal deficit present.     Mental Status: She is alert.  Psychiatric:        Mood and Affect: Mood normal.        Behavior: Behavior normal.     LABORATORY DATA: Labs reviewed      ASSESSMENT and THERAPY PLAN:   Anemia of chronic disease [name] is here for follow-up of her anemia of chronic disease today.  She has been receiving Retacrit 40,000 units every 2 weeks.  She tolerates this well and her hemoglobin today is 12.2 therefore she will not receive any Retacrit.  He is staying at [facility] so I did complete paperwork and she needs to come back in 2 weeks for labs, follow-up, and her next injection.     All questions were answered. The patient knows to call the clinic with any problems, questions or concerns. We can certainly see the patient much sooner if necessary.  Total encounter time:20 minutes*in face-to-face visit time, chart review, lab review, care coordination, order entry, and documentation of the encounter time.    Wilber Bihari, NP 09/20/22 1:29 PM Medical Oncology and Hematology [facility] [place], [place] [address] Tel. [phone]    Fax. [phone]  *Total Encounter Time as defined by the Centers for Medicare and Medicaid Services includes, in addition to the face-to-face time of a patient visit (documented in the note above) non-face-to-face time: obtaining and reviewing outside history, ordering and reviewing medications, tests or procedures, care coordination (communications with other health care professionals or caregivers) and documentation in the medical record.

## 2022-09-19 ENCOUNTER — Ambulatory Visit (INDEPENDENT_AMBULATORY_CARE_PROVIDER_SITE_OTHER): Payer: Self-pay | Admitting: *Deleted

## 2022-09-19 DIAGNOSIS — Z7901 Long term (current) use of anticoagulants: Secondary | ICD-10-CM

## 2022-09-19 DIAGNOSIS — Z952 Presence of prosthetic heart valve: Secondary | ICD-10-CM

## 2022-09-19 NOTE — Patient Instructions (Signed)
Description   12/12 at 953am: Called and spoke to pt and she is still in [facility] and they are managing her warfarin. Pt will follow [facility] SNF instructions. Normal Dose: warfarin 1 tablet daily except 1.5 tablets on Sunday. Recheck INR in 1 week at [facility].  Call with Prednisone doseage changes ('10mg'$  daily - 11/3, decrease to '5mg'$  on 11/5).  Please keep green leafy veggies. Call with any new meds or changes [phone]

## 2022-09-20 ENCOUNTER — Encounter: Payer: Self-pay | Admitting: Hematology and Oncology

## 2022-09-20 ENCOUNTER — Encounter: Payer: Self-pay | Admitting: Adult Health

## 2022-09-20 DIAGNOSIS — N184 Chronic kidney disease, stage 4 (severe): Secondary | ICD-10-CM | POA: Diagnosis not present

## 2022-09-20 DIAGNOSIS — D590 Drug-induced autoimmune hemolytic anemia: Secondary | ICD-10-CM | POA: Diagnosis not present

## 2022-09-20 DIAGNOSIS — E440 Moderate protein-calorie malnutrition: Secondary | ICD-10-CM | POA: Diagnosis not present

## 2022-09-20 DIAGNOSIS — I739 Peripheral vascular disease, unspecified: Secondary | ICD-10-CM | POA: Diagnosis not present

## 2022-09-20 DIAGNOSIS — I482 Chronic atrial fibrillation, unspecified: Secondary | ICD-10-CM | POA: Diagnosis not present

## 2022-09-20 DIAGNOSIS — I5033 Acute on chronic diastolic (congestive) heart failure: Secondary | ICD-10-CM | POA: Diagnosis not present

## 2022-09-20 DIAGNOSIS — N179 Acute kidney failure, unspecified: Secondary | ICD-10-CM | POA: Diagnosis not present

## 2022-09-20 DIAGNOSIS — I5032 Chronic diastolic (congestive) heart failure: Secondary | ICD-10-CM | POA: Diagnosis not present

## 2022-09-20 DIAGNOSIS — I10 Essential (primary) hypertension: Secondary | ICD-10-CM | POA: Diagnosis not present

## 2022-09-20 DIAGNOSIS — E119 Type 2 diabetes mellitus without complications: Secondary | ICD-10-CM | POA: Diagnosis not present

## 2022-09-20 NOTE — Assessment & Plan Note (Signed)
[name] is here for follow-up of her anemia of chronic disease today.  She has been receiving Retacrit 40,000 units every 2 weeks.  She tolerates this well and her hemoglobin today is 12.2 therefore she will not receive any Retacrit.  He is staying at [facility] so I did complete paperwork and she needs to come back in 2 weeks for labs, follow-up, and her next injection.

## 2022-09-21 ENCOUNTER — Telehealth: Payer: Self-pay | Admitting: Physical Medicine and Rehabilitation

## 2022-09-21 NOTE — Telephone Encounter (Signed)
Pt son Nalah Macioce called in requesting appt for pt for right hip injection... Pt son requesting callback

## 2022-09-22 DIAGNOSIS — I10 Essential (primary) hypertension: Secondary | ICD-10-CM | POA: Diagnosis not present

## 2022-09-22 DIAGNOSIS — I482 Chronic atrial fibrillation, unspecified: Secondary | ICD-10-CM | POA: Diagnosis not present

## 2022-09-22 DIAGNOSIS — I5032 Chronic diastolic (congestive) heart failure: Secondary | ICD-10-CM | POA: Diagnosis not present

## 2022-09-22 NOTE — Telephone Encounter (Signed)
Spoke with patient's son and he stated she is having the same pain in right hip as before and she needs an injection. She had 1 fall in the early spring where she hit head and left shoulder. Please advise

## 2022-09-25 DIAGNOSIS — I482 Chronic atrial fibrillation, unspecified: Secondary | ICD-10-CM | POA: Diagnosis not present

## 2022-09-26 ENCOUNTER — Telehealth (HOSPITAL_COMMUNITY): Payer: Self-pay | Admitting: Pharmacist

## 2022-09-26 NOTE — Telephone Encounter (Signed)
Advanced Heart Failure Patient Advocate Encounter  Prior Authorization for tadalafil has been approved. However, patient has not been seen in the [facility] since 02/2021. She will need to schedule an appointment before any refills can be sent.     Effective dates: 09/26/22 through 10/09/23  Audry Riles, PharmD, BCPS, BCCP, CPP Heart Failure Clinic Pharmacist [phone]

## 2022-09-27 ENCOUNTER — Telehealth: Payer: Self-pay | Admitting: Physical Medicine and Rehabilitation

## 2022-09-27 NOTE — Telephone Encounter (Signed)
LVM to inform patient's son that it was authorized to get hip injection and to return call to clinic

## 2022-09-27 NOTE — Telephone Encounter (Signed)
Spoke with patient's son and informed him that we are booked for the remainder of the year. Patient is getting scheduled with Dr. Rolena Infante

## 2022-09-27 NOTE — Telephone Encounter (Signed)
See previous encounter

## 2022-09-27 NOTE — Telephone Encounter (Signed)
Pt's son Thurmond Butts returned call to [place] to set an appt. Please call pt at [phone].

## 2022-09-28 ENCOUNTER — Inpatient Hospital Stay: Payer: Medicare HMO

## 2022-09-28 ENCOUNTER — Inpatient Hospital Stay (HOSPITAL_BASED_OUTPATIENT_CLINIC_OR_DEPARTMENT_OTHER): Payer: Medicare HMO | Admitting: Hematology and Oncology

## 2022-09-28 ENCOUNTER — Encounter: Payer: Self-pay | Admitting: Hematology and Oncology

## 2022-09-28 VITALS — BP 125/62 | HR 62 | Temp 97.9°F | Resp 14 | Ht 60.0 in | Wt 116.6 lb

## 2022-09-28 DIAGNOSIS — R600 Localized edema: Secondary | ICD-10-CM

## 2022-09-28 DIAGNOSIS — D594 Other nonautoimmune hemolytic anemias: Secondary | ICD-10-CM

## 2022-09-28 DIAGNOSIS — D591 Autoimmune hemolytic anemia, unspecified: Secondary | ICD-10-CM | POA: Diagnosis not present

## 2022-09-28 DIAGNOSIS — D638 Anemia in other chronic diseases classified elsewhere: Secondary | ICD-10-CM

## 2022-09-28 DIAGNOSIS — N179 Acute kidney failure, unspecified: Secondary | ICD-10-CM | POA: Diagnosis not present

## 2022-09-28 DIAGNOSIS — Z7901 Long term (current) use of anticoagulants: Secondary | ICD-10-CM | POA: Diagnosis not present

## 2022-09-28 DIAGNOSIS — I7782 Antineutrophilic cytoplasmic antibody (ANCA) vasculitis: Secondary | ICD-10-CM | POA: Diagnosis not present

## 2022-09-28 DIAGNOSIS — M8580 Other specified disorders of bone density and structure, unspecified site: Secondary | ICD-10-CM | POA: Diagnosis not present

## 2022-09-28 DIAGNOSIS — N184 Chronic kidney disease, stage 4 (severe): Secondary | ICD-10-CM | POA: Diagnosis not present

## 2022-09-28 DIAGNOSIS — I130 Hypertensive heart and chronic kidney disease with heart failure and stage 1 through stage 4 chronic kidney disease, or unspecified chronic kidney disease: Secondary | ICD-10-CM | POA: Diagnosis not present

## 2022-09-28 DIAGNOSIS — R300 Dysuria: Secondary | ICD-10-CM | POA: Diagnosis not present

## 2022-09-28 DIAGNOSIS — E1122 Type 2 diabetes mellitus with diabetic chronic kidney disease: Secondary | ICD-10-CM | POA: Diagnosis not present

## 2022-09-28 LAB — CMP (CANCER CENTER ONLY)
ALT: 19 U/L (ref 0–44)
AST: 41 U/L (ref 15–41)
Albumin: 3.6 g/dL (ref 3.5–5.0)
Alkaline Phosphatase: 151 U/L — ABNORMAL HIGH (ref 38–126)
Anion gap: 10 (ref 5–15)
BUN: 54 mg/dL — ABNORMAL HIGH (ref 8–23)
CO2: 25 mmol/L (ref 22–32)
Calcium: 8.8 mg/dL — ABNORMAL LOW (ref 8.9–10.3)
Chloride: 99 mmol/L (ref 98–111)
Creatinine: 2.33 mg/dL — ABNORMAL HIGH (ref 0.44–1.00)
GFR, Estimated: 20 mL/min — ABNORMAL LOW (ref >60)
Glucose, Bld: 206 mg/dL — ABNORMAL HIGH (ref 70–99)
Potassium: 3.9 mmol/L (ref 3.5–5.1)
Sodium: 134 mmol/L — ABNORMAL LOW (ref 135–145)
Total Bilirubin: 0.6 mg/dL (ref 0.3–1.2)
Total Protein: 5.8 g/dL — ABNORMAL LOW (ref 6.5–8.1)

## 2022-09-28 LAB — CBC WITH DIFFERENTIAL (CANCER CENTER ONLY)
Abs Immature Granulocytes: 0.03 10*3/uL (ref 0.00–0.07)
Basophils Absolute: 0.1 10*3/uL (ref 0.0–0.1)
Basophils Relative: 1 %
Eosinophils Absolute: 0.6 10*3/uL — ABNORMAL HIGH (ref 0.0–0.5)
Eosinophils Relative: 7 %
HCT: 32.5 % — ABNORMAL LOW (ref 36.0–46.0)
Hemoglobin: 10.8 g/dL — ABNORMAL LOW (ref 12.0–15.0)
Immature Granulocytes: 0 %
Lymphocytes Relative: 20 %
Lymphs Abs: 1.7 10*3/uL (ref 0.7–4.0)
MCH: 29.9 pg (ref 26.0–34.0)
MCHC: 33.2 g/dL (ref 30.0–36.0)
MCV: 90 fL (ref 80.0–100.0)
Monocytes Absolute: 0.8 10*3/uL (ref 0.1–1.0)
Monocytes Relative: 9 %
Neutro Abs: 5.3 10*3/uL (ref 1.7–7.7)
Neutrophils Relative %: 63 %
Platelet Count: 188 10*3/uL (ref 150–400)
RBC: 3.61 MIL/uL — ABNORMAL LOW (ref 3.87–5.11)
RDW: 15.8 % — ABNORMAL HIGH (ref 11.5–15.5)
WBC Count: 8.4 10*3/uL (ref 4.0–10.5)
nRBC: 0 % (ref 0.0–0.2)

## 2022-09-28 LAB — RETIC PANEL
Immature Retic Fract: 6.9 % (ref 2.3–15.9)
RBC.: 3.62 MIL/uL — ABNORMAL LOW (ref 3.87–5.11)
Retic Count, Absolute: 95.6 10*3/uL (ref 19.0–186.0)
Retic Ct Pct: 2.6 % (ref 0.4–3.1)
Reticulocyte Hemoglobin: 32.4 pg (ref >27.9)

## 2022-09-28 LAB — SAMPLE TO BLOOD BANK

## 2022-09-28 LAB — PROTIME-INR
INR: 1.6 — ABNORMAL HIGH (ref 0.8–1.2)
Prothrombin Time: 19.2 seconds — ABNORMAL HIGH (ref 11.4–15.2)

## 2022-09-28 NOTE — Progress Notes (Signed)
[facility] Follow up:    [name], [name], MD [place] [address]  DIAGNOSIS: Hemolytic Anemia  SUMMARY OF HEMATOLOGIC HISTORY: [name] is a 81 y.o. [name] woman with multifactorial anemia, as follows:             (a) anemia of chronic inflammation: As of May 2020 she has a SED rate of 95, CRP 1.1, positive ANA and RF; subsequently she was found to be ANCA positive             (b) hemolysis (mild): she has a mildly elevated LDH and a DAT positive for complement, not IgG; this is c/w (a) above             (c) anemia of renal insufficiency: inadequate EPO resonse to anemia                         (d) GIB/ iron deficiency in patient on lifelong anticoagulaton   (1) Feraheme received 03/01/2019, repeated 03/20/2019              (a) reticulocyte 03/07/2019 up from 43.4 a year ago to 191.8 after iron infusion             (b) feraheme repeated on 05/12/2019             (c) subsequent ferritin levels >100   (2) darbepoetin: starting 05/05/2019 at 162mg given every 2 weeks             (a) dose increased to 200 mcg every week starting 07/01/2019              (b) back to every 2 weeks beginning 08/04/2019 still at 200 mcg dose             (c) switched to Retacrit every 4 weeks as of 08/18/2019   (3) ANCA positive vasculitis: diagnosed 05/08/2019 (titer 1:640)             (a) prednisone 40 mg daily started 05/31/2019             (b) rituximab weekly started 06/12/2019 (received test dose 06/05/2019)                         (i) hepatitis B sAg, core Ab and HIV negative 05/08/2019                         (ii) rituximab held after 07/14/2019 dose (received 5 weekly doses) with neutropenia                         (iii) rituximab resumed 08/18/2019 but changed to monthly                         (iv) rituximab changed to every 2 months beginning with 02/02/2020 dose                         (v) rituximab changed to every 12 weeks after the 10/12/2020  dose             (c) prednisone tapered to off as of 09/15/2019                          (4) Anemia worsened in spring 2023, refractory  to Rituximab and requiring prednisone restart with slow taper due currently at 1m which she will stay at.   (5) osteopenia with a T score of -1.5 on bone density 02/28/2016 at the breast center  CURRENT THERAPY: Daily Epo injection q 14 days  INTERVAL HISTORY:  [name] TILLIS89y.o. female returns for follow-up of her hemolytic anemia.  Since last visit, she has been in rehab, had diuresis, lost about 20 lbs of weight. She continues to feel tired.  No bleeding issues.  Her warfarin has been managed at the rehab.  She is hoping to be discharged tomorrow.  Rest of the pertinent 10 point ROS reviewed and negative  Patient Active Problem List   Diagnosis Date Noted   Acute on chronic heart failure with preserved ejection fraction (HFpEF) ([place] 08/29/2022   Acute exacerbation of CHF (congestive heart failure) ([place]ad[place] 08/29/2022   Acute urinary retention 08/28/2022   Acute kidney injury superimposed on chronic kidney disease (H[place] 08/27/2022   Constipation 08/27/2022   Nausea 08/27/2022   Hypoalbuminemia due to protein-calorie malnutrition (HDyers[place] 08/27/2022   Type 2 diabetes mellitus with hyperglycemia (HCarl Junction 08/27/2022   Palliative care encounter    Goals of care, counseling/discussion    Counseling and coordination of care    GI bleed 07/09/2022   GI bleeding 07/08/2022   Acute on chronic anemia 07/08/2022   History of Clostridioides difficile colitis 07/08/2022   DNR (do not resuscitate)/DNI(Do Not Intubate) 07/08/2022   Anemia of chronic disease 07/07/2022   Pressure injury of skin 06/23/2022   UTI (urinary tract infection) 06/22/2022   Joint pain 06/09/2022   Parietoalveolar pneumopathy (HHilliard 06/09/2022   Primary osteoarthritis 06/09/2022   Rheumatoid factor positive 06/09/2022   C. difficile colitis 04/19/2022   Colitis 04/18/2022   PAH  (pulmonary artery hypertension) (HOb[place] 04/18/2022   Emphysematous cystitis 04/13/2022   Female bladder prolapse 04/13/2022   Diverticulitis 04/12/2022   Autoimmune hemolytic anemia (HCC) 01/02/2022   Bilateral lower extremity edema 01/02/2022   Refractory anemia (HCC) 12/15/2021   Abnormal chest x-ray 12/15/2021   Supratherapeutic INR 12/15/2021   Elevated troponin 12/15/2021   Unilateral primary osteoarthritis, right hip 05/31/2021   Midline cystocele 05/30/2021   Primary localized osteoarthritis of pelvic region and thigh 05/30/2021   Vaginal pain 05/30/2021   Asymmetric SNHL (sensorineural hearing loss) 01/06/2021   Referred otalgia of both ears 01/06/2021   Bilateral hearing loss 08/31/2020   Low back pain 02/20/2020   CKD (chronic kidney disease), stage IV (HRichmond 10/13/2019   Leukopenia due to antineoplastic chemotherapy (HBuhler 08/18/2019   Thrombophilia (H[place] 06/19/2019   Encounter for general adult medical examination without abnormal findings 06/13/2019   Arteritis (HS[place] 06/06/2019   Hypertensive heart and chronic kidney disease with heart failure and stage 1 through stage 4 chronic kidney disease, or unspecified chronic kidney disease (HSt[place] 06/06/2019   Muscle weakness 06/06/2019   Pancytopenia (HMarion 05/28/2019   Dyspnea 05/28/2019   Vasculitis, ANCA positive 05/28/2019   Disorder of connective tissue (HDelavan 05/14/2019   Renal insufficiency 05/08/2019   PAF (paroxysmal atrial fibrillation) (HSpottsville 04/07/2019   Iron deficiency anemia 02/28/2019   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 (HBel[place] 02/28/2019   Diabetic retinopathy associated with type 2 diabetes mellitus (HBristol 09/02/2018   Chronic pain 06/19/2018   Non-thrombocytopenic purpura (HSanta Ana Pue[place] 05/21/2018   Gout 03/22/2018   Malnutrition of moderate degree 03/12/2018   Diabetes mellitus type 2, controlled (HLincoln 03/06/2018   Chronic atrial fibrillation 03/06/2018   H/O mechanical  aortic valve replacement  03/06/2018   Moderate to severe pulmonary hypertension ([place]) 03/06/2018   GERD without esophagitis 02/21/2018   Anxiety 02/21/2018   Chronic diastolic CHF (congestive heart failure) ([place]) 02/21/2018   General weakness 02/02/2018   Thrombocytopenia ([place]) 01/25/2018   Arm pain, diffuse 01/25/2018   Anemia 01/23/2018   Aortic atherosclerosis ([place]) 01/23/2018   Age-related osteoporosis without current pathological fracture 05/24/2017   Carotid artery occlusion 05/24/2017   Generalized anxiety disorder 05/24/2017   Hearing loss 05/24/2017   Presence of prosthetic heart valve 05/24/2017   Carotid artery disease ([place]) 08/18/2013   Peripheral arterial disease ([place]) 08/18/2013   Diabetes mellitus without complication ([place]) 04/07/1600   Essential hypertension    Hypercholesteremia    Mechanical heart valve present    Chronic anticoagulation     is allergic to flagyl [metronidazole], augmentin [amoxicillin-pot clavulanate], ciprofloxacin, coreg [carvedilol], diflucan [fluconazole], losartan, privigen [immune globulin (human)], sulfamethoxazole-trimethoprim, verapamil, zetia [ezetimibe], zocor [simvastatin - high dose], and gatifloxacin.  MEDICAL HISTORY: Past Medical History:  Diagnosis Date   Aortic atherosclerosis ([place]) 01/23/2018   Atrial fibrillation (HCC)    Benign positional vertigo 10/06/2011   Carotid artery disease (HCC)    Chemotherapy induced neutropenia ([place]) 07/21/2019   Cholelithiasis 01/23/2018   Cholelithiasis 01/23/2018   Chronic anticoagulation    Colovesical fistula, ruled out 04/14/2022   Coronary artery disease    status post coronary artery bypass grafting times 07/10/2004   Diabetes mellitus    Hypercholesteremia    Hypertension    Mechanical heart valve present    H. aortic valve replacement at the time of bypass surgery October 2005   Moderate to severe pulmonary hypertension ([place])    Peripheral arterial disease ([place])    history of left common iliac artery  PTA and stenting for a chronic total occlusion 08/26/01   S/P cholecystectomy 03/06/2018    SURGICAL HISTORY: Past Surgical History:  Procedure Laterality Date   anterior repair  2009   AORTIC VALVE REPLACEMENT  2005   Dr. Cyndia Bent   CARDIAC CATHETERIZATION  11/10/2004   40% right common illiac, 70% in stent restenosis of distal left common illiac,    CARDIAC CATHETERIZATION  05/18/2004   LAD 50-70% midstenosis, RCA dominant w/50% stenosis, 50% Right common Illiac artery ostial stenosis, 90% in stent restenosis within midportion of left common illiac stent   Carotid Duplex  03/12/2012   RSA-elev. velocities suggestive of a 50-69% diameter reduction, Right&Left Bulb/Prox ICA-mild-mod.fibrous plaqueelevating Velocities abnormal study.   CHOLECYSTECTOMY N/A 03/01/2018   Procedure: LAPAROSCOPIC CHOLECYSTECTOMY;  Surgeon: Judeth Horn, MD;  Location: [facility];  Service: General;  Laterality: N/A;   COLONOSCOPY WITH PROPOFOL N/A 01/22/2018   Procedure: COLONOSCOPY WITH PROPOFOL;  Surgeon: Wilford Corner, MD;  Location: [facility];  Service: Endoscopy;  Laterality: N/A;   ESOPHAGOGASTRODUODENOSCOPY N/A 07/13/2022   Procedure: ESOPHAGOGASTRODUODENOSCOPY (EGD);  Surgeon: [name], MD;  Location[facility];  Service: Gastroenterology;  Laterality: N/A;   ESOPHAGOGASTRODUODENOSCOPY (EGD) WITH PROPOFOL N/A 01/22/2018   Procedure: ESOPHAGOGASTRODUODENOSCOPY (EGD) WITH PROPOFOL;  Surgeon: Wilford Corner, MD;  Location: [facility];  Service: Endoscopy;  Laterality: N/A;   ESOPHAGOGASTRODUODENOSCOPY (EGD) WITH PROPOFOL N/A 11/21/2018   Procedure: ESOPHAGOGASTRODUODENOSCOPY (EGD) WITH PROPOFOL;  Surgeon: [name], MD;  Location: [facility];  Service: Gastroenterology;  Laterality: N/A;   ESOPHAGOGASTRODUODENOSCOPY (EGD) WITH PROPOFOL Left 02/27/2019   Procedure: ESOPHAGOGASTRODUODENOSCOPY (EGD) WITH PROPOFOL;  Surgeon: Arta Silence, MD;  Location[facility];  Service: Endoscopy;  Laterality:  Left;   GIVENS CAPSULE STUDY  N/A 11/21/2018   Procedure: GIVENS CAPSULE STUDY;  Surgeon: [name], MD;  Location: [facility];  Service: Gastroenterology;  Laterality: N/A;  To be deployed during EGD   Lower Ext. Duplex  03/12/2012   Right Proximal CIA- vessel narrowing w/elevated velocities 0-49% diameter reduction. Right SFA-mild mixed density plaque throughout vessel.   NM MYOCAR PERF WALL MOTION  05/19/2010   protocol: Persantine, post stress EF 65%, negative for ischemia, low risk scan   RIGHT HEART CATH N/A 06/27/2018   Procedure: RIGHT HEART CATH;  Surgeon: [name], MD;  Location: [facility];  Service: Cardiovascular;  Laterality: N/A;   TOTAL ABDOMINAL HYSTERECTOMY W/ BILATERAL SALPINGOOPHORECTOMY  1989   TRANSTHORACIC ECHOCARDIOGRAM  08/29/2012   Moderately calcified annulus of mitral valve, moderate regurg. of both mitral valve and tricuspid valve.     SOCIAL HISTORY: Social History   Socioeconomic History   Marital status: Widowed    Spouse name: Not on file   Number of children: Not on file   Years of education: Not on file   Highest education level: Not on file  Occupational History   Occupation: Retired  Tobacco Use   Smoking status: Former    Packs/day: 1.00    Years: 30.00    Total pack years: 30.00    Types: Cigarettes   Smokeless tobacco: Never   Tobacco comments:    quit smoking 2005  Vaping Use   Vaping Use: Never used  Substance and Sexual Activity   Alcohol use: No    Alcohol/week: 0.0 standard drinks of alcohol   Drug use: No   Sexual activity: Not Currently    Birth control/protection: None  Other Topics Concern   Not on file  Social History Narrative   Not on file   Social Determinants of Health   Financial Resource Strain: Low Risk  (02/28/2022)   Overall Financial Resource Strain (CARDIA)    Difficulty of Paying Living Expenses: Not hard at all  Food Insecurity: No Food Insecurity (08/31/2022)   Hunger Vital Sign     Worried About Running Out of Food in the Last Year: Never true    [place] in the Last Year: Never true  Transportation Needs: No Transportation Needs (08/31/2022)   PRAPARE - Hydrologist (Medical): No    Lack of Transportation (Non-Medical): No  Physical Activity: Inactive (02/28/2022)   Exercise Vital Sign    Days of Exercise per Week: 0 days    Minutes of Exercise per Session: 0 min  Stress: No Stress Concern Present (02/28/2022)   [place]    Feeling of Stress : Only a little  Social Connections: Moderately Integrated (06/22/2022)   Social Connection and Isolation Panel [NHANES]    Frequency of Communication with Friends and Family: More than three times a week    Frequency of Social Gatherings with Friends and Family: More than three times a week    Attends Religious Services: More than 4 times per year    Active Member of [employer] or Organizations: Yes    Attends Archivist Meetings: More than 4 times per year    Marital Status: Widowed  Intimate Partner Violence: Not At Risk (08/31/2022)   Humiliation, Afraid, Rape, and Kick questionnaire    Fear of Current or Ex-Partner: No    Emotionally Abused: No    Physically Abused: No    Sexually Abused: No  FAMILY HISTORY: Family History  Problem Relation Age of Onset   Heart disease Father    Hypertension Father    Hyperlipidemia Father    Breast cancer Neg Hx     Review of Systems  Constitutional:  Positive for fatigue. Negative for appetite change, chills, fever and unexpected weight change.  HENT:   Negative for hearing loss, lump/mass, mouth sores, trouble swallowing and voice change.   Eyes:  Negative for eye problems and icterus.  Respiratory:  Negative for chest tightness, cough and shortness of breath.   Cardiovascular:  Positive for leg swelling. Negative for chest pain and palpitations.  Gastrointestinal:   Negative for abdominal distention, abdominal pain, blood in stool, constipation, diarrhea, nausea, rectal pain and vomiting.  Endocrine: Negative for hot flashes.  Genitourinary:  Negative for difficulty urinating.   Musculoskeletal:  Negative for arthralgias.  Skin:  Negative for itching and rash.  Neurological:  Negative for dizziness, extremity weakness, headaches and numbness.  Hematological:  Negative for adenopathy. Does not bruise/bleed easily.  Psychiatric/Behavioral:  Negative for depression. The patient is not nervous/anxious.       PHYSICAL EXAMINATION  ECOG PERFORMANCE STATUS: 1 - Symptomatic but completely ambulatory  Vitals:   09/28/22 1254  BP: 125/62  Pulse: 62  Resp: 14  Temp: 97.9 F (36.6 C)  SpO2: 98%    Physical Exam Constitutional:      General: She is not in acute distress.    Appearance: Normal appearance. She is not toxic-appearing.  HENT:     Head: Normocephalic and atraumatic.  Eyes:     General: No scleral icterus. Cardiovascular:     Rate and Rhythm: Normal rate.     Pulses: Normal pulses.     Heart sounds: Murmur (Systolic murmur with a click.) heard.  Pulmonary:     Effort: Pulmonary effort is normal.     Breath sounds: No rhonchi.  Musculoskeletal:        General: No swelling (Bilateral lower extremity edema significantly improved.  No evidence of cellulitis).     Cervical back: Neck supple.     Right lower leg: Edema (significantly better) present.     Left lower leg: Edema (significantly better) present.  Lymphadenopathy:     Cervical: No cervical adenopathy.  Skin:    General: Skin is warm and dry.     Findings: No bruising or rash.  Neurological:     General: No focal deficit present.     Mental Status: She is alert.  Psychiatric:        Mood and Affect: Mood normal.        Behavior: Behavior normal.     LABORATORY DATA:  CBC    Component Value Date/Time   WBC 8.4 09/28/2022 1239   WBC 6.5 09/05/2022 0353   RBC 3.61  (L) 09/28/2022 1239   RBC 3.62 (L) 09/28/2022 1238   HGB 10.8 (L) 09/28/2022 1239   HCT 32.5 (L) 09/28/2022 1239   HCT 28.5 (L) 11/20/2018 1824   PLT 188 09/28/2022 1239   MCV 90.0 09/28/2022 1239   MCH 29.9 09/28/2022 1239   MCHC 33.2 09/28/2022 1239   RDW 15.8 (H) 09/28/2022 1239   LYMPHSABS 1.7 09/28/2022 1239   MONOABS 0.8 09/28/2022 1239   EOSABS 0.6 (H) 09/28/2022 1239   BASOSABS 0.1 09/28/2022 1239    CMP     Component Value Date/Time   NA 134 (L) 09/28/2022 1239   NA 137 12/06/2017 1436   K 3.9 09/28/2022  1239   CL 99 09/28/2022 1239   CO2 25 09/28/2022 1239   GLUCOSE 206 (H) 09/28/2022 1239   BUN 54 (H) 09/28/2022 1239   BUN 20 12/06/2017 1436   CREATININE 2.33 (H) 09/28/2022 1239   CALCIUM 8.8 (L) 09/28/2022 1239   PROT 5.8 (L) 09/28/2022 1239   ALBUMIN 3.6 09/28/2022 1239   AST 41 09/28/2022 1239   ALT 19 09/28/2022 1239   ALKPHOS 151 (H) 09/28/2022 1239   BILITOT 0.6 09/28/2022 1239   GFRNONAA 20 (L) 09/28/2022 1239   GFRAA 32 (L) 03/30/2020 0932    ASSESSMENT and THERAPY PLAN:    [name] is a 81 y.o. female who presents for multifactorial anemia, previously treated with rituximab for ANCA positive hemolytic anemia, previously on maintenance rituximab every 12 weeks presented with worsening anemia thought to be related to her hemolysis.  She was diagnosed with elevated ANCA titers 1 is to 640 and previously treated for ANCA positive hemolytic anemia, seen by rheumatology Dr. Salome Holmes back in 2020, responded at that time to prednisone and rituximab and continued on rituximab maintenance.  However her rituximab was changed to every 54-monthinfusions and she relapsed before her second cycle of every 370-monthituximab.    # Multifactorial Anemia:  --Bone marrow biopsy from 02/20/2022 showed normocellular marrow with trilineage hematopoiesis. No leukemia, lymphoma or high-grade dysplasia identified. --CT CAP from 02/20/2022 did not show any underlying  cause for anemia.  --ANCA titers are normal when checked on 02/10/2022 --Mycophenolate was increased to 1000 mg however this was discontinued while she was hospitalized -- Currently on EPO every 2 weeks.  She is tolerating this really well.  Her hemoglobin has been over 10 g hence we have not given her her last shot 2 weeks ago.  Today it is 10.8 hence we will defer EPO.  She will continue to be scheduled for as needed to titrate hemoglobin to 10 to 11 g/dL and labs every 2 weeks.  She will return to clinic in about 4 weeks.  #Bilateral lower extremity edema significantly improved compared to last visit.  She continues on Lasix per nephrology.  #Warfarin therapy for [employer]. Jude's valve --Under the care of  Coumadin Clinic.  INR 1.6 today.  She is currently she will be titrated according to the protocol with the patient.  #CKD:  -- Followed by nephrology.   Follow up: --Patient will return in 2 weeks for labs and erythropoietin injection  Patient and her son expressed understanding and satisfaction with the plan provided.   I have spent a total of 30 minutes minutes of face-to-face and non-face-to-face time, preparing to see the patient, performing a medically appropriate examination, counseling and educating the patient, documenting clinical information in the electronic health record,and care coordination.

## 2022-09-29 ENCOUNTER — Inpatient Hospital Stay: Payer: Medicare HMO

## 2022-09-29 ENCOUNTER — Ambulatory Visit (INDEPENDENT_AMBULATORY_CARE_PROVIDER_SITE_OTHER): Payer: Medicare HMO | Admitting: Sports Medicine

## 2022-09-29 ENCOUNTER — Inpatient Hospital Stay: Payer: Medicare HMO | Admitting: Hematology and Oncology

## 2022-09-29 ENCOUNTER — Ambulatory Visit: Payer: Self-pay

## 2022-09-29 ENCOUNTER — Telehealth: Payer: Self-pay | Admitting: Cardiovascular Disease

## 2022-09-29 DIAGNOSIS — M1611 Unilateral primary osteoarthritis, right hip: Secondary | ICD-10-CM

## 2022-09-29 DIAGNOSIS — I10 Essential (primary) hypertension: Secondary | ICD-10-CM | POA: Diagnosis not present

## 2022-09-29 DIAGNOSIS — I482 Chronic atrial fibrillation, unspecified: Secondary | ICD-10-CM | POA: Diagnosis not present

## 2022-09-29 DIAGNOSIS — M25551 Pain in right hip: Secondary | ICD-10-CM | POA: Diagnosis not present

## 2022-09-29 DIAGNOSIS — I5032 Chronic diastolic (congestive) heart failure: Secondary | ICD-10-CM | POA: Diagnosis not present

## 2022-09-29 DIAGNOSIS — N390 Urinary tract infection, site not specified: Secondary | ICD-10-CM | POA: Diagnosis not present

## 2022-09-29 MED ORDER — METHYLPREDNISOLONE ACETATE 40 MG/ML IJ SUSP
40.0000 mg | INTRAMUSCULAR | Status: AC | PRN
  Administered 2022-09-29: 40 mg via INTRA_ARTICULAR

## 2022-09-29 MED ORDER — LIDOCAINE HCL 1 % IJ SOLN
4.0000 mL | INTRAMUSCULAR | Status: AC | PRN
  Administered 2022-09-29: 4 mL

## 2022-09-29 NOTE — Telephone Encounter (Signed)
Daughter advised of PharmD recommendation for citalopram. Daughter said patient already bruises easily, but will watch for new sites of bruising. Daughter asked if patient can do INR at [facility], now that she will be staying with daughter. Does she need a special order for change of site?

## 2022-09-29 NOTE — Progress Notes (Signed)
   Procedure Note  Patient: [name]             Date of Birth: 01/17/1941           MRN: [id number]             Visit Date: 09/29/2022  Procedures: Visit Diagnoses:  1. Pain in right hip    Large Joint Inj: R hip joint on 09/29/2022 3:05 PM Indications: pain Details: 22 G 3.5 in needle, ultrasound-guided anterior approach Medications: 4 mL lidocaine 1 %; 40 mg methylPREDNISolone acetate 40 MG/ML Outcome: tolerated well, no immediate complications  Procedure: US-guided intra-articular hip injection, right After discussion on risks/benefits/indications and informed verbal consent was obtained, a timeout was performed. Patient was lying supine on exam table. The hip was cleaned with betadine and alcohol swabs. Then utilizing ultrasound guidance, the patient's femoral head and neck junction was identified and subsequently injected with 4:1 lidocaine:depomedrol via an in-plane approach with ultrasound visualization of the injectate administered into the hip joint. Patient tolerated procedure well without immediate complications.  Procedure, treatment alternatives, risks and benefits explained, specific risks discussed. Consent was given by the patient. Immediately prior to procedure a time out was called to verify the correct patient, procedure, equipment, support staff and site/side marked as required. Patient was prepped and draped in the usual sterile fashion.     - I evaluated the patient about 10 minutes post-injection and she had improvement in pain  - follow-up with Dr. Ernestina Patches or myself as indicated; I am happy to see them as needed  Elba Barman, DO [place]  This note was dictated using Dragon naturally speaking software and may contain errors in syntax, spelling, or content which have not been identified prior to signing this note.

## 2022-09-29 NOTE — Telephone Encounter (Signed)
Citalopram can prolong QTc and has some antiplatelet activity. Her EKGs have been stable and she isn't on other QTc prolonging meds except for prn Zofran. Ok to take, would have her monitor for bruising/bleeding out of the ordinary since she's also on warfarin.

## 2022-09-29 NOTE — Telephone Encounter (Signed)
Pt c/o medication issue:  1. Name of Medication:  citalopram 10 mg antidepressant   2. How are you currently taking this medication (dosage and times per day)?    3. Are you having a reaction (difficulty breathing--STAT)?   4. What is your medication issue? Pt daughter wants to know if this okay for her take with her other medications

## 2022-09-29 NOTE — Telephone Encounter (Signed)
Spoke with daughter [name].  Mother now lives with her in [place] and has INR checked weekly at oncology appointments and is becoming increasingly irritable regarding frequency of medical appointments. Does not want to travel to [facility] for INR checks. Advised we have a Coumadin clinic in [place] which daughter thinks may be more acceptable to patient. Scheduled for upcoming Wednesday.

## 2022-10-02 DIAGNOSIS — I272 Pulmonary hypertension, unspecified: Secondary | ICD-10-CM | POA: Diagnosis not present

## 2022-10-02 DIAGNOSIS — E1122 Type 2 diabetes mellitus with diabetic chronic kidney disease: Secondary | ICD-10-CM | POA: Diagnosis not present

## 2022-10-02 DIAGNOSIS — M109 Gout, unspecified: Secondary | ICD-10-CM | POA: Diagnosis not present

## 2022-10-02 DIAGNOSIS — E1151 Type 2 diabetes mellitus with diabetic peripheral angiopathy without gangrene: Secondary | ICD-10-CM | POA: Diagnosis not present

## 2022-10-02 DIAGNOSIS — I130 Hypertensive heart and chronic kidney disease with heart failure and stage 1 through stage 4 chronic kidney disease, or unspecified chronic kidney disease: Secondary | ICD-10-CM | POA: Diagnosis not present

## 2022-10-02 DIAGNOSIS — I480 Paroxysmal atrial fibrillation: Secondary | ICD-10-CM | POA: Diagnosis not present

## 2022-10-02 DIAGNOSIS — I5032 Chronic diastolic (congestive) heart failure: Secondary | ICD-10-CM | POA: Diagnosis not present

## 2022-10-02 DIAGNOSIS — N184 Chronic kidney disease, stage 4 (severe): Secondary | ICD-10-CM | POA: Diagnosis not present

## 2022-10-02 DIAGNOSIS — I2510 Atherosclerotic heart disease of native coronary artery without angina pectoris: Secondary | ICD-10-CM | POA: Diagnosis not present

## 2022-10-03 ENCOUNTER — Ambulatory Visit: Payer: Medicare HMO

## 2022-10-05 ENCOUNTER — Ambulatory Visit: Payer: Medicare HMO | Attending: Cardiology | Admitting: *Deleted

## 2022-10-05 DIAGNOSIS — Z5181 Encounter for therapeutic drug level monitoring: Secondary | ICD-10-CM

## 2022-10-05 DIAGNOSIS — Z7901 Long term (current) use of anticoagulants: Secondary | ICD-10-CM | POA: Diagnosis not present

## 2022-10-05 DIAGNOSIS — Z952 Presence of prosthetic heart valve: Secondary | ICD-10-CM | POA: Diagnosis not present

## 2022-10-05 LAB — POCT INR: INR: 1.7 — AB (ref 2.0–3.0)

## 2022-10-05 NOTE — Patient Instructions (Signed)
Pt has been take warfarin '5mg'$  daily except 7.'5mg'$  on Sundays and Fridays Increase warfarin to 7.'5mg'$  daily except '5mg'$  on Mondays, Wednesdays and Fridays. Recheck in 2 weeks

## 2022-10-12 ENCOUNTER — Other Ambulatory Visit: Payer: Self-pay

## 2022-10-12 ENCOUNTER — Inpatient Hospital Stay: Payer: Medicare HMO | Attending: Oncology

## 2022-10-12 ENCOUNTER — Inpatient Hospital Stay: Payer: Medicare HMO

## 2022-10-12 VITALS — BP 138/51 | HR 62 | Temp 98.5°F | Resp 15

## 2022-10-12 DIAGNOSIS — D638 Anemia in other chronic diseases classified elsewhere: Secondary | ICD-10-CM

## 2022-10-12 DIAGNOSIS — I7782 Antineutrophilic cytoplasmic antibody (ANCA) vasculitis: Secondary | ICD-10-CM | POA: Diagnosis not present

## 2022-10-12 DIAGNOSIS — D591 Autoimmune hemolytic anemia, unspecified: Secondary | ICD-10-CM | POA: Insufficient documentation

## 2022-10-12 DIAGNOSIS — N189 Chronic kidney disease, unspecified: Secondary | ICD-10-CM | POA: Insufficient documentation

## 2022-10-12 DIAGNOSIS — D464 Refractory anemia, unspecified: Secondary | ICD-10-CM | POA: Insufficient documentation

## 2022-10-12 DIAGNOSIS — D594 Other nonautoimmune hemolytic anemias: Secondary | ICD-10-CM

## 2022-10-12 DIAGNOSIS — M8580 Other specified disorders of bone density and structure, unspecified site: Secondary | ICD-10-CM | POA: Insufficient documentation

## 2022-10-12 DIAGNOSIS — D500 Iron deficiency anemia secondary to blood loss (chronic): Secondary | ICD-10-CM

## 2022-10-12 LAB — CBC WITH DIFFERENTIAL/PLATELET
Abs Immature Granulocytes: 0.02 10*3/uL (ref 0.00–0.07)
Basophils Absolute: 0 10*3/uL (ref 0.0–0.1)
Basophils Relative: 1 %
Eosinophils Absolute: 0.9 10*3/uL — ABNORMAL HIGH (ref 0.0–0.5)
Eosinophils Relative: 13 %
HCT: 28.4 % — ABNORMAL LOW (ref 36.0–46.0)
Hemoglobin: 9.5 g/dL — ABNORMAL LOW (ref 12.0–15.0)
Immature Granulocytes: 0 %
Lymphocytes Relative: 17 %
Lymphs Abs: 1.2 10*3/uL (ref 0.7–4.0)
MCH: 30.2 pg (ref 26.0–34.0)
MCHC: 33.5 g/dL (ref 30.0–36.0)
MCV: 90.2 fL (ref 80.0–100.0)
Monocytes Absolute: 0.7 10*3/uL (ref 0.1–1.0)
Monocytes Relative: 11 %
Neutro Abs: 3.9 10*3/uL (ref 1.7–7.7)
Neutrophils Relative %: 58 %
Platelets: 115 10*3/uL — ABNORMAL LOW (ref 150–400)
RBC: 3.15 MIL/uL — ABNORMAL LOW (ref 3.87–5.11)
RDW: 16.4 % — ABNORMAL HIGH (ref 11.5–15.5)
WBC: 6.7 10*3/uL (ref 4.0–10.5)
nRBC: 0 % (ref 0.0–0.2)

## 2022-10-12 LAB — COMPREHENSIVE METABOLIC PANEL WITH GFR
ALT: 22 U/L (ref 0–44)
AST: 38 U/L (ref 15–41)
Albumin: 3.5 g/dL (ref 3.5–5.0)
Alkaline Phosphatase: 134 U/L — ABNORMAL HIGH (ref 38–126)
Anion gap: 10 (ref 5–15)
BUN: 57 mg/dL — ABNORMAL HIGH (ref 8–23)
CO2: 23 mmol/L (ref 22–32)
Calcium: 9.1 mg/dL (ref 8.9–10.3)
Chloride: 101 mmol/L (ref 98–111)
Creatinine, Ser: 2.35 mg/dL — ABNORMAL HIGH (ref 0.44–1.00)
GFR, Estimated: 20 mL/min — ABNORMAL LOW (ref >60)
Glucose, Bld: 199 mg/dL — ABNORMAL HIGH (ref 70–99)
Potassium: 4.1 mmol/L (ref 3.5–5.1)
Sodium: 134 mmol/L — ABNORMAL LOW (ref 135–145)
Total Bilirubin: 0.4 mg/dL (ref 0.3–1.2)
Total Protein: 5.2 g/dL — ABNORMAL LOW (ref 6.5–8.1)

## 2022-10-12 LAB — RETIC PANEL
Immature Retic Fract: 5.2 % (ref 2.3–15.9)
RBC.: 3.13 MIL/uL — ABNORMAL LOW (ref 3.87–5.11)
Retic Count, Absolute: 56 K/uL (ref 19.0–186.0)
Retic Ct Pct: 1.8 % (ref 0.4–3.1)
Reticulocyte Hemoglobin: 32.7 pg (ref >27.9)

## 2022-10-12 LAB — PROTIME-INR
INR: 2.7 — ABNORMAL HIGH (ref 0.8–1.2)
Prothrombin Time: 28 seconds — ABNORMAL HIGH (ref 11.4–15.2)

## 2022-10-12 LAB — SAMPLE TO BLOOD BANK

## 2022-10-12 MED ORDER — EPOETIN ALFA-EPBX 40000 UNIT/ML IJ SOLN
40000.0000 [IU] | Freq: Once | INTRAMUSCULAR | Status: AC
  Administered 2022-10-12: 40000 [IU] via SUBCUTANEOUS
  Filled 2022-10-12: qty 1

## 2022-10-12 NOTE — Patient Instructions (Signed)

## 2022-10-13 ENCOUNTER — Ambulatory Visit (INDEPENDENT_AMBULATORY_CARE_PROVIDER_SITE_OTHER): Payer: Medicare HMO | Admitting: *Deleted

## 2022-10-13 DIAGNOSIS — Z952 Presence of prosthetic heart valve: Secondary | ICD-10-CM

## 2022-10-13 DIAGNOSIS — Z5181 Encounter for therapeutic drug level monitoring: Secondary | ICD-10-CM

## 2022-10-13 NOTE — Patient Instructions (Addendum)
Description   Spoke with pt and advised to continue taking warfarin to 7.'5mg'$  daily except '5mg'$  on Mondays, Wednesdays and Fridays. Keep boost intake consistent. Recheck in 1 week at [facility] office (appt previously made).

## 2022-10-16 DIAGNOSIS — I482 Chronic atrial fibrillation, unspecified: Secondary | ICD-10-CM | POA: Diagnosis not present

## 2022-10-16 DIAGNOSIS — M109 Gout, unspecified: Secondary | ICD-10-CM | POA: Diagnosis not present

## 2022-10-16 DIAGNOSIS — I130 Hypertensive heart and chronic kidney disease with heart failure and stage 1 through stage 4 chronic kidney disease, or unspecified chronic kidney disease: Secondary | ICD-10-CM | POA: Diagnosis not present

## 2022-10-16 DIAGNOSIS — E1151 Type 2 diabetes mellitus with diabetic peripheral angiopathy without gangrene: Secondary | ICD-10-CM | POA: Diagnosis not present

## 2022-10-16 DIAGNOSIS — D631 Anemia in chronic kidney disease: Secondary | ICD-10-CM | POA: Diagnosis not present

## 2022-10-16 DIAGNOSIS — N179 Acute kidney failure, unspecified: Secondary | ICD-10-CM | POA: Diagnosis not present

## 2022-10-16 DIAGNOSIS — I7782 Antineutrophilic cytoplasmic antibody (ANCA) vasculitis: Secondary | ICD-10-CM | POA: Diagnosis not present

## 2022-10-16 DIAGNOSIS — R338 Other retention of urine: Secondary | ICD-10-CM | POA: Diagnosis not present

## 2022-10-16 DIAGNOSIS — R051 Acute cough: Secondary | ICD-10-CM | POA: Diagnosis not present

## 2022-10-16 DIAGNOSIS — N184 Chronic kidney disease, stage 4 (severe): Secondary | ICD-10-CM | POA: Diagnosis not present

## 2022-10-16 DIAGNOSIS — I5032 Chronic diastolic (congestive) heart failure: Secondary | ICD-10-CM | POA: Diagnosis not present

## 2022-10-16 DIAGNOSIS — M6281 Muscle weakness (generalized): Secondary | ICD-10-CM | POA: Diagnosis not present

## 2022-10-16 DIAGNOSIS — J189 Pneumonia, unspecified organism: Secondary | ICD-10-CM | POA: Diagnosis not present

## 2022-10-16 DIAGNOSIS — D638 Anemia in other chronic diseases classified elsewhere: Secondary | ICD-10-CM | POA: Diagnosis not present

## 2022-10-16 DIAGNOSIS — E1122 Type 2 diabetes mellitus with diabetic chronic kidney disease: Secondary | ICD-10-CM | POA: Diagnosis not present

## 2022-10-16 DIAGNOSIS — K5909 Other constipation: Secondary | ICD-10-CM | POA: Diagnosis not present

## 2022-10-16 DIAGNOSIS — Z1152 Encounter for screening for COVID-19: Secondary | ICD-10-CM | POA: Diagnosis not present

## 2022-10-17 ENCOUNTER — Ambulatory Visit: Payer: Medicare HMO | Admitting: Orthopaedic Surgery

## 2022-10-19 ENCOUNTER — Ambulatory Visit: Payer: Medicare HMO | Attending: Cardiovascular Disease | Admitting: *Deleted

## 2022-10-19 DIAGNOSIS — Z7901 Long term (current) use of anticoagulants: Secondary | ICD-10-CM

## 2022-10-19 DIAGNOSIS — Z952 Presence of prosthetic heart valve: Secondary | ICD-10-CM

## 2022-10-19 LAB — POCT INR: INR: 4.6 — AB (ref 2.0–3.0)

## 2022-10-19 NOTE — Patient Instructions (Signed)
Hold warfarin tonight and tomorrow night then decrease dose to 1 tablet daily except 1 1/2 tablets on Sundays. Keep boost intake consistent. Recheck in 1 week at [facility] office

## 2022-10-25 ENCOUNTER — Ambulatory Visit: Payer: Medicare HMO | Attending: Cardiovascular Disease | Admitting: *Deleted

## 2022-10-25 DIAGNOSIS — Z7901 Long term (current) use of anticoagulants: Secondary | ICD-10-CM

## 2022-10-25 DIAGNOSIS — Z952 Presence of prosthetic heart valve: Secondary | ICD-10-CM | POA: Diagnosis not present

## 2022-10-25 LAB — POCT INR: INR: 3.3 — AB (ref 2.0–3.0)

## 2022-10-25 NOTE — Patient Instructions (Signed)
Hold warfarin tonight then decrease dose to 1 tablet daily   Keep boost intake consistent.  Recheck in 1 week at [facility] office

## 2022-10-26 ENCOUNTER — Ambulatory Visit (HOSPITAL_COMMUNITY)
Admission: RE | Admit: 2022-10-26 | Discharge: 2022-10-26 | Disposition: A | Discharge status: Home / Self Care | Payer: Medicare HMO | Source: Ambulatory Visit | Attending: Cardiology | Admitting: Cardiology

## 2022-10-26 ENCOUNTER — Encounter (HOSPITAL_COMMUNITY): Payer: Self-pay | Admitting: Cardiology

## 2022-10-26 VITALS — BP 120/60 | HR 56 | Wt 114.0 lb

## 2022-10-26 DIAGNOSIS — Z7982 Long term (current) use of aspirin: Secondary | ICD-10-CM | POA: Diagnosis not present

## 2022-10-26 DIAGNOSIS — Z952 Presence of prosthetic heart valve: Secondary | ICD-10-CM | POA: Diagnosis not present

## 2022-10-26 DIAGNOSIS — I7782 Antineutrophilic cytoplasmic antibody (ANCA) vasculitis: Secondary | ICD-10-CM | POA: Insufficient documentation

## 2022-10-26 DIAGNOSIS — M301 Polyarteritis with lung involvement [Churg-Strauss]: Secondary | ICD-10-CM | POA: Diagnosis not present

## 2022-10-26 DIAGNOSIS — Z951 Presence of aortocoronary bypass graft: Secondary | ICD-10-CM | POA: Diagnosis not present

## 2022-10-26 DIAGNOSIS — I272 Pulmonary hypertension, unspecified: Secondary | ICD-10-CM | POA: Insufficient documentation

## 2022-10-26 DIAGNOSIS — I130 Hypertensive heart and chronic kidney disease with heart failure and stage 1 through stage 4 chronic kidney disease, or unspecified chronic kidney disease: Secondary | ICD-10-CM | POA: Insufficient documentation

## 2022-10-26 DIAGNOSIS — I739 Peripheral vascular disease, unspecified: Secondary | ICD-10-CM

## 2022-10-26 DIAGNOSIS — N184 Chronic kidney disease, stage 4 (severe): Secondary | ICD-10-CM | POA: Insufficient documentation

## 2022-10-26 DIAGNOSIS — E1122 Type 2 diabetes mellitus with diabetic chronic kidney disease: Secondary | ICD-10-CM | POA: Diagnosis not present

## 2022-10-26 DIAGNOSIS — I5032 Chronic diastolic (congestive) heart failure: Secondary | ICD-10-CM | POA: Insufficient documentation

## 2022-10-26 DIAGNOSIS — Z79899 Other long term (current) drug therapy: Secondary | ICD-10-CM | POA: Diagnosis not present

## 2022-10-26 DIAGNOSIS — I4811 Longstanding persistent atrial fibrillation: Secondary | ICD-10-CM | POA: Insufficient documentation

## 2022-10-26 DIAGNOSIS — I2510 Atherosclerotic heart disease of native coronary artery without angina pectoris: Secondary | ICD-10-CM | POA: Diagnosis not present

## 2022-10-26 DIAGNOSIS — Z7901 Long term (current) use of anticoagulants: Secondary | ICD-10-CM | POA: Diagnosis not present

## 2022-10-26 LAB — BASIC METABOLIC PANEL
Anion gap: 13 (ref 5–15)
BUN: 76 mg/dL — ABNORMAL HIGH (ref 8–23)
CO2: 20 mmol/L — ABNORMAL LOW (ref 22–32)
Calcium: 9.1 mg/dL (ref 8.9–10.3)
Chloride: 100 mmol/L (ref 98–111)
Creatinine, Ser: 2.55 mg/dL — ABNORMAL HIGH (ref 0.44–1.00)
GFR, Estimated: 18 mL/min — ABNORMAL LOW (ref >60)
Glucose, Bld: 165 mg/dL — ABNORMAL HIGH (ref 70–99)
Potassium: 3.7 mmol/L (ref 3.5–5.1)
Sodium: 133 mmol/L — ABNORMAL LOW (ref 135–145)

## 2022-10-26 LAB — BRAIN NATRIURETIC PEPTIDE: B Natriuretic Peptide: 390.1 pg/mL — ABNORMAL HIGH (ref 0.0–100.0)

## 2022-10-26 LAB — LIPID PANEL
Cholesterol: 113 mg/dL (ref 0–200)
HDL: 25 mg/dL — ABNORMAL LOW (ref >40)
LDL Cholesterol: 49 mg/dL (ref 0–99)
Total CHOL/HDL Ratio: 4.5 RATIO
Triglycerides: 193 mg/dL — ABNORMAL HIGH (ref <150)
VLDL: 39 mg/dL (ref 0–40)

## 2022-10-26 NOTE — Progress Notes (Signed)
Date:  10/26/2022   ID:  [name], DOB 01-21-1941, MRN [id number]    Provider location: [facility] Type of Visit: Established patient   PCP:  [name], MD  Cardiologist:  Quay Burow, MD Primary HF: Dr. Aundra Dubin   History of Present Illness: [name] is a 82 y.o. female who has a history of PAD, CAD s/p CABG, mechanical AVR, pulmonary hypertension, and chronic diastolic CHF with prominent RV dysfunction. Patient had CABG-AVR in 10/05.  She has been in chronic atrial fibrillation, it appears, since at least 4/17.  She has significant PAD followed by Dr. [name].     She was admitted in 5/19 with dyspnea and marked volume overloaded. She was started on Lasix and it was titrated up.  Echo showed EF 55-60% with moderate RV dilation, severe RV systolic dysfunction, and PASP 88 mmHg.  [place] in 9/19 showed moderate pulmonary hypertension with near normal filling pressures. PVR was not particularly high at 3.1 WU primarily due to elevated cardiac output. V/Q scan did not show chronic PEs and PFTs were unremarkable. She is on Adcirca.    She was admitted in 2/20 with upper GI bleeding, found to have gastric ulcers.  She had a CT chest in 3/20 with subtle bilateral ground glass opacities.  Referred to pulmonary.   She was admitted again in 6/20 with a GI bleed and required transfusion.   She was seen by pulmonary in 7/20, PFTs showed no obstruction/restriction but decreased DLCO.    She was seen by rheumatology due to concern for rheumatoid arthritis.  She was thought to have gout and OA but no RA.   Echo was done in 7/20 showing  EF 60-65% with mild LVH, normal RV, mechanical aortic valve with mean gradient 17 mmHg, PASP 34 mmHg.   In 8/20, she was admitted with CHF/pneumonitis and pancytopenia.  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  She has been treated with rituximab and prednisone, now off.    Echo 4/21 showed EF 65-70%, normal RV, moderate TR, mechanical aortic valve with mean gradient 20 mmHg, PASP 36 mmHg.   Had echo 4/22 w/ stable findings. LVEF 65-70% w/ GIIIDD, prior mechanical AVR stable w/ gradient similar to prior study (mean gradient 18 mmHg). RV normal w/ mildly elevated RVSP at 30.5.   Echo in 8/23 showed EF 55-60%, mild LVH, normal RV, PASP 42 mmHg, mild MR, mild-moderate TR, mechanical St Jude aortic valve with mean gradient 26 mmHg (up from 21 mmHg).   She was admitted in 11/23 with AKI on CKD stage 4, she was initially given IV fluid then Lasix when she became volume overloaded.  Thought to be poor HD candidate due to frailty.   She returns today for followup of CHF.  Weight has trended down.  She is frail and comes in a wheelchair today.  She lives with her daughter.  At home, she is able to walk around the house with no problems.  She is able to dress with no problems.  She does get short of breath bathing.  No chest pain.  No lightheadedness. She is in atrial fibrillation today and appears to have been in atrial fibrillation persistently since 4/23.   REDS clip 32%  ECG (personally reviewed): atrial fibrillation, septal Qs  Labs (8/19): K 4.2, creatinine 0.8, LDL 56 Labs (9/19): K 4.3, Na 129, creatinine 0.87, hgb 9.8 Labs (10/19): ANA+, RF 36.8 (  elevated), SCL-70 negative, anti-centromere Ab negative, ds-DNA negative.  Labs (12/19): K 4, creatinine 1.09 Labs (1/20): CCP Ab elevated 115 Labs (2/20): K 4.1, creatinine 1.24 Labs (7/20): K 4.2, creatinine 1.99, hgb 6.5 (transfused) Labs (11/20): K 3.5, creatinine 1.64 Labs (12/20): LDL 60 Labs (5/21): K 4.4, creatinine 1.85 => 2.66 Labs (03/30/20): Creatinine 1.7  Labs (1/24): K 4.1, creatinine 2.35, hgb 9.5   6 min walk (10/19): 189 m 6 min walk (1/20): 28 m   PMH: 1. Mechanical St Jude aortic valve 10/05: Goal INR 2.5-3.5.  2. CAD: s/p CABG x 2 with AVR in 10/05.  - Cardiolite in 2014 showed no  ischemia.  3. HTN 4. Hyperlipidemia 5. Type 2 diabetes 6. PAD: Peripheral arterial dopplers (7/19) with >50% CIA stenosis on right, 70-99% CIA stenosis on the left.  7. H/o CCY 8. Atrial fibrillation: Long-standing persistent.  9. Chronic diastolic CHF with prominent RV dysfunction: Echo (5/19) with EF 83-38%, grade 3 diastolic dysfunction, mechanical aortic valve with mean gradient 10 mmHg, mild MR, moderate RV dilation with moderate-severely decreased RV systolic function, biatrial enlargement, PASP 88 mmHg.  - Echo (7/20): EF 60-65% with mild LVH, normal RV, mechanical aortic valve with mean gradient 17 mmHg, PASP 34 mmHg.  - Echo (4/21): EF 65-70%, normal RV, moderate TR, mechanical aortic valve with mean gradient 20 mmHg, PASP 36 mmHg.  - Echo (8/23): EF 55-60%, mild LVH, normal RV, PASP 42 mmHg, mild MR, mild-moderate TR, mechanical St Jude aortic valve with mean gradient 26 mmHg (up from 21 mmHg).  10. Carotid stenosis: 40-59% BICA stenosis on 7/19 carotid dopplers.  - Carotid dopplers (5/21): 40-59% BICA stenosis.  11. Pulmonary hypertension: RHC (9/19) with mean RA 5, PA 63/17 mean 36, mean PCWP 16, CI 4.16, PVR 3.1 WU.  - V/Q scan (10/19): no evidence for chronic PE.  - PFTs (10/19): minimal obstruction.  - ANA+, RF 36.8 (elevated), SCL-70 negative, anti-centromere Ab negative, ds-DNA negative, CCP Ab positive. She saw rheumatology, NOT thought to have RA.  - Echo in 7/20 with normal-appearing RV and PASP 34 mmHg.  - PFTs (7/20) with no significant obstruction/restriction but decreased DLCO.  - PYP scan not suggestive of TTR amyloidosis.  - Echo (4/21): EF 65-70%, normal RV, moderate TR, mechanical aortic valve with mean gradient 20 mmHg, PASP 36 mmHg.  - Echo (8/23): EF 55-60%, mild LVH, normal RV, PASP 42 mmHg, mild MR, mild-moderate TR, mechanical St Jude aortic valve with mean gradient 26 mmHg (up from 21 mmHg).  12. 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 History of GI bleeding.  15.  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  - Treated with Rituximab and prednisone.  16. CKD: stage 4.   Current Outpatient Medications  Medication Sig Dispense Refill   acetaminophen (TYLENOL) 325 MG tablet Take 325 mg by mouth every 6 (six) hours as needed for mild pain or fever.     allopurinol (ZYLOPRIM) 100 MG tablet Take 100 mg by mouth daily.     ALPRAZolam (XANAX) 0.25 MG tablet Take 1 tablet (0.25 mg total) by mouth 2 (two) times daily as needed for anxiety or sleep. 10 tablet 0   amLODipine (NORVASC) 10 MG tablet TAKE 1 TABLET EVERY DAY 90 tablet 3   aspirin 81 MG chewable tablet 1 tablet Orally Once a day     atorvastatin (LIPITOR) 80 MG tablet Take 1 tablet (80 mg total) by mouth daily at 6 PM.     Cholecalciferol (VITAMIN  D3) 50 MCG (2000 UT) capsule Take 2,000 Units by mouth daily with lunch.     Cyanocobalamin (VITAMIN B12) 1000 MCG TBCR Take 1,000 mcg by mouth daily with lunch.     famotidine (PEPCID) 20 MG tablet Take 20 mg by mouth 2 (two) times daily.     feeding supplement, GLUCERNA SHAKE, (GLUCERNA SHAKE) LIQD Take 237 mLs by mouth 3 (three) times daily between meals.  0   ferrous sulfate 325 (65 FE) MG tablet Take 1 tablet (325 mg total) by mouth daily with breakfast.  3   furosemide (LASIX) 40 MG tablet TAKE 2 TABLETS IN MORNING AND 1 AND 1/2 TABLETS IN EVENING. CHANGE IN DOSAGE. 315 tablet 0   hydrALAZINE (APRESOLINE) 100 MG tablet TAKE 1 TABLET THREE TIMES DAILY 270 tablet 3   insulin aspart (NOVOLOG) 100 UNIT/ML injection Inject 2-8 Units into the skin 3 (three) times daily with meals. Per sliding scale, If blood sugar is 200 to 250, give 2 units. If blood sugar is 251 to 300, give 4 units. If blood sugar is 301 to 350, give 6 units. If blood sugar is greater than 350, give 8 units and call MD.     Insulin Syringe-Needle U-100 (B-D INS SYR ULTRAFINE .3CC/29G) 29G X 1/2" 0.3 ML MISC Use for sliding scale  for breakthrough high blood sugar readings 100 each 1   metoprolol succinate (TOPROL-XL) 25 MG 24 hr tablet Take 1 tablet (25 mg total) by mouth daily. 45 tablet 0   ondansetron (ZOFRAN-ODT) 4 MG disintegrating tablet Take 4 mg by mouth every 8 (eight) hours as needed for nausea or vomiting.     pantoprazole (PROTONIX) 40 MG tablet Take 40 mg by mouth 2 (two) times daily.     Sertraline HCl (ZOLOFT PO) Take 1 tablet by mouth daily.     simethicone (GAS-X) 80 MG chewable tablet Chew 1 tablet (80 mg total) by mouth 4 (four) times daily as needed for flatulence. 100 tablet 2   tadalafil, PAH, (ADCIRCA) 20 MG tablet Take 2 tablets (40 mg total) by mouth daily. 60 tablet 11   warfarin (COUMADIN) 5 MG tablet Take 1 tablet (5 mg total) by mouth See admin instructions. Take 1 tablet ever day except for on Sundays 1 and 1/2 tablets     No current facility-administered medications for this encounter.    Allergies:   Flagyl [metronidazole], Augmentin [amoxicillin-pot clavulanate], Ciprofloxacin, Coreg [carvedilol], Diflucan [fluconazole], Losartan, Privigen [immune globulin (human)], Sulfamethoxazole-trimethoprim, Verapamil, Zetia [ezetimibe], Zocor [simvastatin - high dose], and Gatifloxacin   Social History:  The patient  reports that she has quit smoking. Her smoking use included cigarettes. She has a 30.00 pack-year smoking history. She has never used smokeless tobacco. She reports that she does not drink alcohol and does not use drugs.   Family History:  [name] family history includes Heart disease in her father; Hyperlipidemia in her father; Hypertension in her father.   ROS:  Please see the history of present illness.   All other systems are personally reviewed and negative.   Exam:   BP 120/60   Pulse (!) 56   Wt 51.7 kg (114 lb)   SpO2 96%   BMI 22.26 kg/m    ReDS Vest / Clip - 10/26/22 1200       ReDS Vest / Clip   Station Marker B    Ruler Value 26.5    ReDS Value Range Low  volume    ReDS Actual Value 32  General: NAD Neck: JVP 8 cm, no thyromegaly or thyroid nodule.  Lungs: Occasional rhonchi CV: Nondisplaced PMI.  Heart irregular S1/S2, with mechanical S2, no S3/S4, 2/6 early SEM RUSB.  2+ edema 1/3 to knees bilaterally.  No carotid bruit.  Normal pedal pulses.  Abdomen: Soft, nontender, no hepatosplenomegaly, no distention.  Skin: Intact without lesions or rashes.  Neurologic: Alert and oriented x 3.  Psych: Normal affect. Extremities: No clubbing or cyanosis.  HEENT: Normal.   Recent Labs: 02/04/2022: TSH 1.652 09/04/2022: Magnesium 1.8 10/12/2022: ALT 22; Hemoglobin 9.5; Platelets 115 10/26/2022: B Natriuretic Peptide 390.1; BUN 76; Creatinine, Ser 2.55; Potassium 3.7; Sodium 133  Personally reviewed   Wt Readings from Last 3 Encounters:  10/26/22 51.7 kg (114 lb)  09/28/22 52.9 kg (116 lb 9.6 oz)  09/18/22 54.6 kg (120 lb 6.4 oz)     ASSESSMENT AND PLAN:  1. Atrial fibrillation: Long-standing persistent.  She has been in AF, it appears, since 4/23. I suspect she would be unlikely to easily convert and remains in NSR at this point.  For now, will follow rate control strategy.  - Continue  Toprol XL 25 mg daily.    - Continue warfarin, goal INR 2.5-3.5.  2. Chronic diastolic CHF: PYP scan did not suggest ATTR amyloidosis.  Most recent echo in 8/23 showed EF 55-60%, mild LVH, normal RV, PASP 42 mmHg, mild MR, mild-moderate TR, mechanical St Jude aortic valve with mean gradient 26 mmHg (up from 21 mmHg). Elevation in gradient across the aortic valve may play a role.  REDS clip does not suggest volume overload though she has peripheral edema.  JVP not significantly elevated. Weight trending down. NYHA class III, probably limited more by frailty than anything else.  - Continue Lasix 80 mg qam/60 mg qpm. BMET/BNP today.  - I will have her wear graded compression stockings.  3. CAD: denies CP  - Continue ASA 81.  - Continue statin, check  lipids.  4. PAD: Significant on 7/19 dopplers but no claudication, no pedal ulcerations. Followed by Dr. [name].  - Continue statin and ASA.  5. Mechanical aortic valve: Mean gradient elevated on recent echoes, 26 mmHg on 8/23 echo up from 21 mmHg on the prior.  This probably plays a role in CHF.  She is on warfarin with INR goal 2.5-3.5 and ASA 81 mg daily.  - I do not think that there is a role here for close investigation of aortic valve by TEE or CTA.  She has had an elevated gradient 20+ on last 2 echoes, do not think she would be a candidate for redo valve replacement and we cannot anticoagulate her more aggressively.  6. Pulmonary hypertension: Moderate pulmonary hypertension on 9/19 with RV dysfunction and dilation noted on last echo. PVR only 3.1 WU but cardiac output was high.  V/Q scan in 10/19 with no evidence for chronic PEs and PFTs in 10/19 and again in 7/20 were unremarkable except for low DLCO.  I think there is a possible group 1 PH component though this is a borderline call as PVR is not significantly elevated. ANA and RF positive, as is CCP Ab.  She saw rheumatology and per her report, was not thought to have rheumatoid arthritis.  Subsequently, she has been diagnosed with p-ANCA+ vasculitis. CT chest showed subtle bilateral ground glass opacities in 2020, she is followed by pulmonary.  Recent repeat echo in 8/23 showed normal RV and estimated PA systolic pressure 42 mmHg mmHg.  - Continue Adcirca  40 mg daily.  It was borderline to start this, not sure she has had marked benefit.  Will hold off on starting a 2nd Eva medication. 7. Rheumatology: Joint pain, positive RF and CCP Ab.  Lung findings (ground glass, pulmonary hypertension) also thought to be concerning for RA. However, per patient's report, she was by her rheumatologist that did not have RA and did have gout.  More recently, she has been found to have p-ANCA+ vasculitis, eosinophilic granulomatosis with polyangiitis.  She was  treated with rituximab and prednisone by Dr Jana Hakim.   8. HTN: BP controlled on current regimen.  9. CKD: Stage 4.  She would be a poor candidate for HD. She has P-ANCA vasculitis.  - Followed by Dr. [name].  - BMET today.   Followup in 3 months with APP.   Signed, Loralie Champagne, MD  10/26/2022   [place] [address] [facility] [place] [address] [phone] (office) [phone] (fax)

## 2022-10-26 NOTE — Progress Notes (Signed)
ReDS Vest / Clip - 10/26/22 1200       ReDS Vest / Clip   Station Marker B    Ruler Value 26.5    ReDS Value Range Low volume    ReDS Actual Value 32

## 2022-10-26 NOTE — Patient Instructions (Signed)
There has been no changes to your medications.  Labs done today, your results will be available in MyChart, we will contact you for abnormal readings.  Your physician has requested that you have an echocardiogram. Echocardiography is a painless test that uses sound waves to create images of your heart. It provides your doctor with information about the size and shape of your heart and how well your heart's chambers and valves are working. This procedure takes approximately one hour. There are no restrictions for this procedure. Please do NOT wear cologne, perfume, aftershave, or lotions (deodorant is allowed). Please arrive 15 minutes prior to your appointment time.  Your physician recommends that you schedule a follow-up appointment in: 3 months  If you have any questions or concerns before your next appointment please send [place] a message through [place] or call our office at [phone].    TO LEAVE A MESSAGE FOR THE NURSE SELECT OPTION 2, PLEASE LEAVE A MESSAGE INCLUDING: YOUR NAME DATE OF BIRTH CALL BACK NUMBER REASON FOR CALL**this is important as we prioritize the call backs  YOU WILL RECEIVE A CALL BACK THE SAME DAY AS LONG AS YOU CALL BEFORE 4:00 PM  At the [facility], you and your health needs are our priority. As part of our continuing mission to provide you with exceptional heart care, we have created designated Provider Care Teams. These Care Teams include your primary Cardiologist (physician) and Advanced Practice Providers (APPs- Physician Assistants and Nurse Practitioners) who all work together to provide you with the care you need, when you need it.   You may see any of the following providers on your designated Care Team at your next follow up: Dr Glori Bickers Dr Loralie Champagne Dr. Roxana Hires, NP Lyda Jester, [place] [facility] [place], [place] Forestine Na, NP Audry Riles, PharmD   Please be sure to bring in all your  medications bottles to every appointment.

## 2022-10-27 ENCOUNTER — Other Ambulatory Visit: Payer: Self-pay

## 2022-10-27 ENCOUNTER — Inpatient Hospital Stay: Payer: Medicare HMO

## 2022-10-27 ENCOUNTER — Inpatient Hospital Stay (HOSPITAL_BASED_OUTPATIENT_CLINIC_OR_DEPARTMENT_OTHER): Payer: Medicare HMO | Admitting: Hematology and Oncology

## 2022-10-27 VITALS — BP 147/66 | HR 67 | Temp 97.8°F | Resp 16 | Ht 60.0 in | Wt 113.2 lb

## 2022-10-27 DIAGNOSIS — I5033 Acute on chronic diastolic (congestive) heart failure: Secondary | ICD-10-CM | POA: Diagnosis not present

## 2022-10-27 DIAGNOSIS — N184 Chronic kidney disease, stage 4 (severe): Secondary | ICD-10-CM | POA: Diagnosis not present

## 2022-10-27 DIAGNOSIS — I7782 Antineutrophilic cytoplasmic antibody (ANCA) vasculitis: Secondary | ICD-10-CM | POA: Diagnosis not present

## 2022-10-27 DIAGNOSIS — D631 Anemia in chronic kidney disease: Secondary | ICD-10-CM | POA: Diagnosis not present

## 2022-10-27 DIAGNOSIS — E1151 Type 2 diabetes mellitus with diabetic peripheral angiopathy without gangrene: Secondary | ICD-10-CM | POA: Diagnosis not present

## 2022-10-27 DIAGNOSIS — D591 Autoimmune hemolytic anemia, unspecified: Secondary | ICD-10-CM

## 2022-10-27 DIAGNOSIS — J189 Pneumonia, unspecified organism: Secondary | ICD-10-CM | POA: Diagnosis not present

## 2022-10-27 DIAGNOSIS — N189 Chronic kidney disease, unspecified: Secondary | ICD-10-CM | POA: Diagnosis not present

## 2022-10-27 DIAGNOSIS — I130 Hypertensive heart and chronic kidney disease with heart failure and stage 1 through stage 4 chronic kidney disease, or unspecified chronic kidney disease: Secondary | ICD-10-CM | POA: Diagnosis not present

## 2022-10-27 DIAGNOSIS — R600 Localized edema: Secondary | ICD-10-CM | POA: Diagnosis not present

## 2022-10-27 DIAGNOSIS — N179 Acute kidney failure, unspecified: Secondary | ICD-10-CM | POA: Diagnosis not present

## 2022-10-27 DIAGNOSIS — I482 Chronic atrial fibrillation, unspecified: Secondary | ICD-10-CM | POA: Diagnosis not present

## 2022-10-27 DIAGNOSIS — E1122 Type 2 diabetes mellitus with diabetic chronic kidney disease: Secondary | ICD-10-CM | POA: Diagnosis not present

## 2022-10-27 DIAGNOSIS — D638 Anemia in other chronic diseases classified elsewhere: Secondary | ICD-10-CM

## 2022-10-27 DIAGNOSIS — D594 Other nonautoimmune hemolytic anemias: Secondary | ICD-10-CM

## 2022-10-27 DIAGNOSIS — M8580 Other specified disorders of bone density and structure, unspecified site: Secondary | ICD-10-CM | POA: Diagnosis not present

## 2022-10-27 DIAGNOSIS — D464 Refractory anemia, unspecified: Secondary | ICD-10-CM | POA: Diagnosis not present

## 2022-10-27 LAB — CBC WITH DIFFERENTIAL/PLATELET
Abs Immature Granulocytes: 0.02 10*3/uL (ref 0.00–0.07)
Basophils Absolute: 0.1 10*3/uL (ref 0.0–0.1)
Basophils Relative: 1 %
Eosinophils Absolute: 0.4 10*3/uL (ref 0.0–0.5)
Eosinophils Relative: 5 %
HCT: 36.4 % (ref 36.0–46.0)
Hemoglobin: 11.9 g/dL — ABNORMAL LOW (ref 12.0–15.0)
Immature Granulocytes: 0 %
Lymphocytes Relative: 21 %
Lymphs Abs: 1.6 10*3/uL (ref 0.7–4.0)
MCH: 29.8 pg (ref 26.0–34.0)
MCHC: 32.7 g/dL (ref 30.0–36.0)
MCV: 91 fL (ref 80.0–100.0)
Monocytes Absolute: 0.8 10*3/uL (ref 0.1–1.0)
Monocytes Relative: 10 %
Neutro Abs: 4.9 10*3/uL (ref 1.7–7.7)
Neutrophils Relative %: 63 %
Platelets: 164 10*3/uL (ref 150–400)
RBC: 4 MIL/uL (ref 3.87–5.11)
RDW: 18.3 % — ABNORMAL HIGH (ref 11.5–15.5)
WBC: 7.8 10*3/uL (ref 4.0–10.5)
nRBC: 0 % (ref 0.0–0.2)

## 2022-10-27 LAB — RETIC PANEL
Immature Retic Fract: 6.2 % (ref 2.3–15.9)
RBC.: 3.97 MIL/uL (ref 3.87–5.11)
Retic Count, Absolute: 95.3 10*3/uL (ref 19.0–186.0)
Retic Ct Pct: 2.4 % (ref 0.4–3.1)
Reticulocyte Hemoglobin: 33.4 pg (ref >27.9)

## 2022-10-27 LAB — COMPREHENSIVE METABOLIC PANEL
ALT: 15 U/L (ref 0–44)
AST: 31 U/L (ref 15–41)
Albumin: 3.7 g/dL (ref 3.5–5.0)
Alkaline Phosphatase: 121 U/L (ref 38–126)
Anion gap: 10 (ref 5–15)
BUN: 70 mg/dL — ABNORMAL HIGH (ref 8–23)
CO2: 24 mmol/L (ref 22–32)
Calcium: 9.8 mg/dL (ref 8.9–10.3)
Chloride: 101 mmol/L (ref 98–111)
Creatinine, Ser: 2.74 mg/dL — ABNORMAL HIGH (ref 0.44–1.00)
GFR, Estimated: 17 mL/min — ABNORMAL LOW (ref >60)
Glucose, Bld: 156 mg/dL — ABNORMAL HIGH (ref 70–99)
Potassium: 4.2 mmol/L (ref 3.5–5.1)
Sodium: 135 mmol/L (ref 135–145)
Total Bilirubin: 0.7 mg/dL (ref 0.3–1.2)
Total Protein: 6 g/dL — ABNORMAL LOW (ref 6.5–8.1)

## 2022-10-27 LAB — PROTIME-INR
INR: 2.2 — ABNORMAL HIGH (ref 0.8–1.2)
Prothrombin Time: 24.2 seconds — ABNORMAL HIGH (ref 11.4–15.2)

## 2022-10-27 LAB — SAMPLE TO BLOOD BANK

## 2022-10-27 NOTE — Progress Notes (Signed)
[facility] Follow up:    [name] Lenis, MD [place] [id number]  DIAGNOSIS: Hemolytic Anemia  SUMMARY OF HEMATOLOGIC HISTORY: [name] is a 82 y.o. [place] woman with multifactorial anemia, as follows:             (a) anemia of chronic inflammation: As of May 2020 she has a SED rate of 95, CRP 1.1, positive ANA and RF; subsequently she was found to be ANCA positive             (b) hemolysis (mild): she has a mildly elevated LDH and a DAT positive for complement, not IgG; this is c/w (a) above             (c) anemia of renal insufficiency: inadequate EPO resonse to anemia                         (d) GIB/ iron deficiency in patient on lifelong anticoagulaton   (1) Feraheme received 03/01/2019, repeated 03/20/2019              (a) reticulocyte 03/07/2019 up from 43.4 a year ago to 191.8 after iron infusion             (b) feraheme repeated on 05/12/2019             (c) subsequent ferritin levels >100   (2) darbepoetin: starting 05/05/2019 at 1108mg given every 2 weeks             (a) dose increased to 200 mcg every week starting 07/01/2019              (b) back to every 2 weeks beginning 08/04/2019 still at 200 mcg dose             (c) switched to Retacrit every 4 weeks as of 08/18/2019   (3) ANCA positive vasculitis: diagnosed 05/08/2019 (titer 1:640)             (a) prednisone 40 mg daily started 05/31/2019             (b) rituximab weekly started 06/12/2019 (received test dose 06/05/2019)                         (i) hepatitis B sAg, core Ab and HIV negative 05/08/2019                         (ii) rituximab held after 07/14/2019 dose (received 5 weekly doses) with neutropenia                         (iii) rituximab resumed 08/18/2019 but changed to monthly                         (iv) rituximab changed to every 2 months beginning with 02/02/2020 dose                         (v) rituximab changed to every 12 weeks after the  10/12/2020 dose             (c) prednisone tapered to off as of 09/15/2019                          (4) Anemia worsened in  spring 2023, refractory to Rituximab and requiring prednisone restart with slow taper due currently at '20mg'$  which she will stay at.   (5) osteopenia with a T score of -1.5 on bone density 02/28/2016 at the [facility] center  CURRENT THERAPY: Daily Epo injection q 14 days  INTERVAL HISTORY:  [name] 82 y.o. female returns for follow-up of her hemolytic anemia.  Since last visit, she has had a cold, still recovering from it. She is feeling better She continues to feel tired.  No bleeding issues.   She says nothing tastes good, so she has been eating less. Rest of the pertinent 10 point ROS reviewed and negative  Patient Active Problem List   Diagnosis Date Noted   Acute on chronic heart failure with preserved ejection fraction (HFpEF) ([place]) 08/29/2022   Acute exacerbation of CHF (congestive heart failure) ([place]) 08/29/2022   Acute urinary retention 08/28/2022   Acute kidney injury superimposed on chronic kidney disease ([place]) 08/27/2022   Constipation 08/27/2022   Nausea 08/27/2022   Hypoalbuminemia due to protein-calorie malnutrition ([place]) 08/27/2022   Type 2 diabetes mellitus with hyperglycemia ([name]) 08/27/2022   Palliative care encounter    Goals of care, counseling/discussion    Encounter for therapeutic drug monitoring    GI bleed 07/09/2022   GI bleeding 07/08/2022   Acute on chronic anemia 07/08/2022   History of Clostridioides difficile colitis 07/08/2022   DNR (do not resuscitate)/DNI(Do Not Intubate) 07/08/2022   Anemia of chronic disease 07/07/2022   Pressure injury of skin 06/23/2022   UTI (urinary tract infection) 06/22/2022   Joint pain 06/09/2022   Parietoalveolar pneumopathy ([place]) 06/09/2022   Primary osteoarthritis 06/09/2022   Rheumatoid factor positive 06/09/2022   C. difficile colitis 04/19/2022   Colitis 04/18/2022   PAH (pulmonary  artery hypertension) ([place]) 04/18/2022   Emphysematous cystitis 04/13/2022   Female bladder prolapse 04/13/2022   Diverticulitis 04/12/2022   Autoimmune hemolytic anemia (HCC) 01/02/2022   Bilateral lower extremity edema 01/02/2022   Refractory anemia (HCC) 12/15/2021   Abnormal chest x-ray 12/15/2021   Supratherapeutic INR 12/15/2021   Elevated troponin 12/15/2021   Unilateral primary osteoarthritis, right hip 05/31/2021   Midline cystocele 05/30/2021   Primary localized osteoarthritis of pelvic region and thigh 05/30/2021   Vaginal pain 05/30/2021   Asymmetric SNHL (sensorineural hearing loss) 01/06/2021   Referred otalgia of both ears 01/06/2021   Bilateral hearing loss 08/31/2020   Low back pain 02/20/2020   CKD (chronic kidney disease), stage IV ([place]) 10/13/2019   Leukopenia due to antineoplastic chemotherapy ([place]) 08/18/2019   Thrombophilia ([place]) 06/19/2019   Encounter for general adult medical examination without abnormal findings 06/13/2019   Arteritis ([place]) 06/06/2019   Hypertensive heart and chronic kidney disease with heart failure and stage 1 through stage 4 chronic kidney disease, or unspecified chronic kidney disease ([place]) 06/06/2019   Muscle weakness 06/06/2019   Pancytopenia ([place]) 05/28/2019   Dyspnea 05/28/2019   Vasculitis, ANCA positive 05/28/2019   Disorder of connective tissue ([place]) 05/14/2019   Renal insufficiency 05/08/2019   PAF (paroxysmal atrial fibrillation) ([place]) 04/07/2019   Iron deficiency anemia 02/28/2019   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 ([place]) 02/28/2019   Diabetic retinopathy associated with type 2 diabetes mellitus ([place]) 09/02/2018   Chronic pain 06/19/2018   Non-thrombocytopenic purpura ([place]) 05/21/2018   Gout 03/22/2018   Malnutrition of moderate degree 03/12/2018   Diabetes mellitus type 2, controlled ([place]) 03/06/2018   Chronic atrial fibrillation 03/06/2018   H/O mechanical aortic valve  replacement 03/06/2018    Moderate to severe pulmonary hypertension ([place]) 03/06/2018   GERD without esophagitis 02/21/2018   Anxiety 02/21/2018   Chronic diastolic CHF (congestive heart failure) ([place]) 02/21/2018   General weakness 02/02/2018   Thrombocytopenia ([place]) 01/25/2018   Arm pain, diffuse 01/25/2018   Anemia 01/23/2018   Aortic atherosclerosis ([place]) 01/23/2018   Age-related osteoporosis without current pathological fracture 05/24/2017   Carotid artery occlusion 05/24/2017   Generalized anxiety disorder 05/24/2017   Hearing loss 05/24/2017   Presence of prosthetic heart valve 05/24/2017   Carotid artery disease ([place]) 08/18/2013   Peripheral arterial disease ([place]) 08/18/2013   Diabetes mellitus without complication ([place]) 10/96/0454   Essential hypertension    Hypercholesteremia    Mechanical heart valve present    Chronic anticoagulation     is allergic to flagyl [metronidazole], augmentin [amoxicillin-pot clavulanate], ciprofloxacin, coreg [carvedilol], diflucan [fluconazole], losartan, privigen [immune globulin (human)], sulfamethoxazole-trimethoprim, verapamil, zetia [ezetimibe], zocor [simvastatin - high dose], and gatifloxacin.  MEDICAL HISTORY: Past Medical History:  Diagnosis Date   Aortic atherosclerosis ([place]) 01/23/2018   Atrial fibrillation (HCC)    Benign positional vertigo 10/06/2011   Carotid artery disease (HCC)    Chemotherapy induced neutropenia ([place]) 07/21/2019   Cholelithiasis 01/23/2018   Cholelithiasis 01/23/2018   Chronic anticoagulation    Colovesical fistula, ruled out 04/14/2022   Coronary artery disease    status post coronary artery bypass grafting times 07/10/2004   Diabetes mellitus    Hypercholesteremia    Hypertension    Mechanical heart valve present    H. aortic valve replacement at the time of bypass surgery October 2005   Moderate to severe pulmonary hypertension ([place])    Peripheral arterial disease ([place])    history of left common iliac artery PTA and  stenting for a chronic total occlusion 08/26/01   S/P cholecystectomy 03/06/2018    SURGICAL HISTORY: Past Surgical History:  Procedure Laterality Date   anterior repair  2009   AORTIC VALVE REPLACEMENT  2005   Dr. Cyndia Bent   CARDIAC CATHETERIZATION  11/10/2004   40% right common illiac, 70% in stent restenosis of distal left common illiac,    CARDIAC CATHETERIZATION  05/18/2004   LAD 50-70% midstenosis, RCA dominant w/50% stenosis, 50% Right common Illiac artery ostial stenosis, 90% in stent restenosis within midportion of left common illiac stent   Carotid Duplex  03/12/2012   RSA-elev. velocities suggestive of a 50-69% diameter reduction, Right&Left Bulb/Prox ICA-mild-mod.fibrous plaqueelevating Velocities abnormal study.   CHOLECYSTECTOMY N/A 03/01/2018   Procedure: LAPAROSCOPIC CHOLECYSTECTOMY;  Surgeon: Judeth Horn, MD;  Location: [facility];  Service: General;  Laterality: N/A;   COLONOSCOPY WITH PROPOFOL N/A 01/22/2018   Procedure: COLONOSCOPY WITH PROPOFOL;  Surgeon: Wilford Corner, MD;  Location: [facility];  Service: Endoscopy;  Laterality: N/A;   ESOPHAGOGASTRODUODENOSCOPY N/A 07/13/2022   Procedure: ESOPHAGOGASTRODUODENOSCOPY (EGD);  Surgeon: [name], MD;  Location: [facility];  Service: Gastroenterology;  Laterality: N/A;   ESOPHAGOGASTRODUODENOSCOPY (EGD) WITH PROPOFOL N/A 01/22/2018   Procedure: ESOPHAGOGASTRODUODENOSCOPY (EGD) WITH PROPOFOL;  Surgeon: Wilford Corner, MD;  Location: [facility];  Service: Endoscopy;  Laterality: N/A;   ESOPHAGOGASTRODUODENOSCOPY (EGD) WITH PROPOFOL N/A 11/21/2018   Procedure: ESOPHAGOGASTRODUODENOSCOPY (EGD) WITH PROPOFOL;  Surgeon: [name], MD;  Location: [facility];  Service: Gastroenterology;  Laterality: N/A;   ESOPHAGOGASTRODUODENOSCOPY (EGD) WITH PROPOFOL Left 02/27/2019   Procedure: ESOPHAGOGASTRODUODENOSCOPY (EGD) WITH PROPOFOL;  Surgeon: Arta Silence, MD;  [facility];  Service: Endoscopy;  Laterality: Left;    GIVENS CAPSULE STUDY N/A 11/21/2018  Procedure: GIVENS CAPSULE STUDY;  Surgeon: [name], MD;  Location: [facility];  Service: Gastroenterology;  Laterality: N/A;  To be deployed during EGD   Lower Ext. Duplex  03/12/2012   Right Proximal CIA- vessel narrowing w/elevated velocities 0-49% diameter reduction. Right SFA-mild mixed density plaque throughout vessel.   NM MYOCAR PERF WALL MOTION  05/19/2010   protocol: Persantine, post stress EF 65%, negative for ischemia, low risk scan   RIGHT HEART CATH N/A 06/27/2018   Procedure: RIGHT HEART CATH;  Surgeon: [name], MD;  Location: [facility];  Service: Cardiovascular;  Laterality: N/A;   TOTAL ABDOMINAL HYSTERECTOMY W/ BILATERAL SALPINGOOPHORECTOMY  1989   TRANSTHORACIC ECHOCARDIOGRAM  08/29/2012   Moderately calcified annulus of mitral valve, moderate regurg. of both mitral valve and tricuspid valve.     SOCIAL HISTORY: Social History   Socioeconomic History   Marital status: Widowed    Spouse name: Not on file   Number of children: Not on file   Years of education: Not on file   Highest education level: Not on file  Occupational History   Occupation: Retired  Tobacco Use   Smoking status: Former    Packs/day: 1.00    Years: 30.00    Total pack years: 30.00    Types: Cigarettes   Smokeless tobacco: Never   Tobacco comments:    quit smoking 2005  Vaping Use   Vaping Use: Never used  Substance and Sexual Activity   Alcohol use: No    Alcohol/week: 0.0 standard drinks of alcohol   Drug use: No   Sexual activity: Not Currently    Birth control/protection: None  Other Topics Concern   Not on file  Social History Narrative   Not on file   Social Determinants of Health   Financial Resource Strain: Low Risk  (02/28/2022)   Overall Financial Resource Strain (CARDIA)    Difficulty of Paying Living Expenses: Not hard at all  Food Insecurity: No Food Insecurity (08/31/2022)   Hunger Vital Sign    Worried  About Running Out of Food in the Last Year: Never true    [place] in the Last Year: Never true  Transportation Needs: No Transportation Needs (08/31/2022)   PRAPARE - Hydrologist (Medical): No    Lack of Transportation (Non-Medical): No  Physical Activity: Inactive (02/28/2022)   Exercise Vital Sign    Days of Exercise per Week: 0 days    Minutes of Exercise per Session: 0 min  Stress: No Stress Concern Present (02/28/2022)   [place]    Feeling of Stress : Only a little  Social Connections: Moderately Integrated (06/22/2022)   Social Connection and Isolation Panel [NHANES]    Frequency of Communication with Friends and Family: More than three times a week    Frequency of Social Gatherings with Friends and Family: More than three times a week    Attends Religious Services: More than 4 times per year    Active Member of [employer] or Organizations: Yes    Attends Archivist Meetings: More than 4 times per year    Marital Status: Widowed  Intimate Partner Violence: Not At Risk (08/31/2022)   Humiliation, Afraid, Rape, and Kick questionnaire    Fear of Current or Ex-Partner: No    Emotionally Abused: No    Physically Abused: No    Sexually Abused: No    FAMILY HISTORY:  Family History  Problem Relation Age of Onset   Heart disease Father    Hypertension Father    Hyperlipidemia Father    Breast cancer Neg Hx     Review of Systems  Constitutional:  Positive for fatigue. Negative for appetite change, chills, fever and unexpected weight change.  HENT:   Negative for hearing loss, lump/mass, mouth sores, trouble swallowing and voice change.   Eyes:  Negative for eye problems and icterus.  Respiratory:  Negative for chest tightness, cough and shortness of breath.   Cardiovascular:  Positive for leg swelling. Negative for chest pain and palpitations.  Gastrointestinal:   Negative for abdominal distention, abdominal pain, blood in stool, constipation, diarrhea, nausea, rectal pain and vomiting.  Endocrine: Negative for hot flashes.  Genitourinary:  Negative for difficulty urinating.   Musculoskeletal:  Negative for arthralgias.  Skin:  Negative for itching and rash.  Neurological:  Negative for dizziness, extremity weakness, headaches and numbness.  Hematological:  Negative for adenopathy. Does not bruise/bleed easily.  Psychiatric/Behavioral:  Negative for depression. The patient is not nervous/anxious.       PHYSICAL EXAMINATION  ECOG PERFORMANCE STATUS: 1 - Symptomatic but completely ambulatory  Vitals:   10/27/22 1323  BP: (!) 147/66  Pulse: 67  Resp: 16  Temp: 97.8 F (36.6 C)  SpO2: 100%    Physical Exam Constitutional:      General: She is not in acute distress.    Appearance: Normal appearance. She is not toxic-appearing.  HENT:     Head: Normocephalic and atraumatic.  Eyes:     General: No scleral icterus. Cardiovascular:     Rate and Rhythm: Normal rate.     Pulses: Normal pulses.     Heart sounds: Murmur (Systolic murmur with a click.) heard.  Pulmonary:     Effort: Pulmonary effort is normal.     Breath sounds: No rhonchi.  Musculoskeletal:        General: No swelling (Bilateral lower extremity edema significantly improved.  No evidence of cellulitis).     Cervical back: Neck supple.     Right lower leg: Edema (significantly better) present.     Left lower leg: Edema (significantly better) present.  Lymphadenopathy:     Cervical: No cervical adenopathy.  Skin:    General: Skin is warm and dry.     Findings: No bruising or rash.  Neurological:     General: No focal deficit present.     Mental Status: She is alert.  Psychiatric:        Mood and Affect: Mood normal.        Behavior: Behavior normal.     LABORATORY DATA:  CBC    Component Value Date/Time   WBC 7.8 10/27/2022 1305   RBC 4.00 10/27/2022 1305    RBC 3.97 10/27/2022 1304   HGB 11.9 (L) 10/27/2022 1305   HGB 10.8 (L) 09/28/2022 1239   HCT 36.4 10/27/2022 1305   HCT 28.5 (L) 11/20/2018 1824   PLT 164 10/27/2022 1305   PLT 188 09/28/2022 1239   MCV 91.0 10/27/2022 1305   MCH 29.8 10/27/2022 1305   MCHC 32.7 10/27/2022 1305   RDW 18.3 (H) 10/27/2022 1305   LYMPHSABS 1.6 10/27/2022 1305   MONOABS 0.8 10/27/2022 1305   EOSABS 0.4 10/27/2022 1305   BASOSABS 0.1 10/27/2022 1305    CMP     Component Value Date/Time   NA 133 (L) 10/26/2022 1239   NA 137 12/06/2017 1436   K  3.7 10/26/2022 1239   CL 100 10/26/2022 1239   CO2 20 (L) 10/26/2022 1239   GLUCOSE 165 (H) 10/26/2022 1239   BUN 76 (H) 10/26/2022 1239   BUN 20 12/06/2017 1436   CREATININE 2.55 (H) 10/26/2022 1239   CREATININE 2.33 (H) 09/28/2022 1239   CALCIUM 9.1 10/26/2022 1239   PROT 5.2 (L) 10/12/2022 1341   ALBUMIN 3.5 10/12/2022 1341   AST 38 10/12/2022 1341   AST 41 09/28/2022 1239   ALT 22 10/12/2022 1341   ALT 19 09/28/2022 1239   ALKPHOS 134 (H) 10/12/2022 1341   BILITOT 0.4 10/12/2022 1341   BILITOT 0.6 09/28/2022 1239   GFRNONAA 18 (L) 10/26/2022 1239   GFRNONAA 20 (L) 09/28/2022 1239   GFRAA 32 (L) 03/30/2020 0932    ASSESSMENT and THERAPY PLAN:    [name] is a 82 y.o. female who presents for multifactorial anemia, previously treated with rituximab for ANCA positive hemolytic anemia, previously on maintenance rituximab every 12 weeks presented with worsening anemia thought to be related to her hemolysis.  She was diagnosed with elevated ANCA titers 1 is to 640 and previously treated for ANCA positive hemolytic anemia, seen by rheumatology Dr. Salome Holmes back in 2020, responded at that time to prednisone and rituximab and continued on rituximab maintenance.  However her rituximab was changed to every 51-monthinfusions and she relapsed before her second cycle of every 336-monthituximab.    # Multifactorial Anemia:  --Bone marrow biopsy from  02/20/2022 showed normocellular marrow with trilineage hematopoiesis. No leukemia, lymphoma or high-grade dysplasia identified. --CT CAP from 02/20/2022 did not show any underlying cause for anemia.  --ANCA titers are normal when checked on 02/10/2022 -- Currently on EPO every 2 weeks. -- She is here for a follow up, now on medrol dose pak, just finished it. --Hb today over 11 g/dl. No need for retacrit.  #Bilateral lower extremity edema significantly improved compared to last visit.  She continues on Lasix per nephrology.  #Warfarin therapy for [employer] Jude's valve --Under the care of  [facility].  She will be titrated according to the protocol with the patient. --INR today therapeutic.  #CKD:  -- Followed by nephrology.   Follow up: --Patient will return in 2 weeks for labs and erythropoietin injection I have spent a total of 20 minutes minutes of face-to-face and non-face-to-face time, preparing to see the patient, performing a medically appropriate examination, counseling and educating the patient, documenting clinical information in the electronic health record,and care coordination.   PrBenay PikeD

## 2022-10-28 ENCOUNTER — Other Ambulatory Visit (HOSPITAL_COMMUNITY): Payer: Self-pay | Admitting: Adult Health

## 2022-10-28 ENCOUNTER — Other Ambulatory Visit (HOSPITAL_COMMUNITY): Payer: Self-pay | Admitting: Cardiology

## 2022-10-30 ENCOUNTER — Telehealth: Payer: Self-pay | Admitting: Hematology and Oncology

## 2022-10-30 DIAGNOSIS — N3946 Mixed incontinence: Secondary | ICD-10-CM | POA: Diagnosis not present

## 2022-10-30 DIAGNOSIS — R8271 Bacteriuria: Secondary | ICD-10-CM | POA: Diagnosis not present

## 2022-10-30 DIAGNOSIS — R338 Other retention of urine: Secondary | ICD-10-CM | POA: Diagnosis not present

## 2022-10-30 NOTE — Telephone Encounter (Signed)
Scheduled appointment per 1/19 los. Left voicemail.

## 2022-10-31 ENCOUNTER — Encounter (HOSPITAL_COMMUNITY): Payer: Self-pay | Admitting: Cardiology

## 2022-10-31 DIAGNOSIS — E1122 Type 2 diabetes mellitus with diabetic chronic kidney disease: Secondary | ICD-10-CM | POA: Diagnosis not present

## 2022-10-31 DIAGNOSIS — E1151 Type 2 diabetes mellitus with diabetic peripheral angiopathy without gangrene: Secondary | ICD-10-CM | POA: Diagnosis not present

## 2022-10-31 DIAGNOSIS — N184 Chronic kidney disease, stage 4 (severe): Secondary | ICD-10-CM | POA: Diagnosis not present

## 2022-10-31 DIAGNOSIS — N179 Acute kidney failure, unspecified: Secondary | ICD-10-CM | POA: Diagnosis not present

## 2022-10-31 DIAGNOSIS — I130 Hypertensive heart and chronic kidney disease with heart failure and stage 1 through stage 4 chronic kidney disease, or unspecified chronic kidney disease: Secondary | ICD-10-CM | POA: Diagnosis not present

## 2022-10-31 DIAGNOSIS — D631 Anemia in chronic kidney disease: Secondary | ICD-10-CM | POA: Diagnosis not present

## 2022-10-31 DIAGNOSIS — I482 Chronic atrial fibrillation, unspecified: Secondary | ICD-10-CM | POA: Diagnosis not present

## 2022-10-31 DIAGNOSIS — I5033 Acute on chronic diastolic (congestive) heart failure: Secondary | ICD-10-CM | POA: Diagnosis not present

## 2022-10-31 DIAGNOSIS — J189 Pneumonia, unspecified organism: Secondary | ICD-10-CM | POA: Diagnosis not present

## 2022-11-02 ENCOUNTER — Ambulatory Visit: Payer: Medicare HMO | Attending: Cardiovascular Disease | Admitting: *Deleted

## 2022-11-02 DIAGNOSIS — Z7901 Long term (current) use of anticoagulants: Secondary | ICD-10-CM

## 2022-11-02 DIAGNOSIS — I5032 Chronic diastolic (congestive) heart failure: Secondary | ICD-10-CM | POA: Diagnosis not present

## 2022-11-02 DIAGNOSIS — Z952 Presence of prosthetic heart valve: Secondary | ICD-10-CM

## 2022-11-02 DIAGNOSIS — E119 Type 2 diabetes mellitus without complications: Secondary | ICD-10-CM | POA: Diagnosis not present

## 2022-11-02 LAB — POCT INR: INR: 4.3 — AB (ref 2.0–3.0)

## 2022-11-02 NOTE — Patient Instructions (Signed)
Hold warfarin tonight then decrease dose to 1 tablet daily except 1/2 tablet on Tuesdays and Fridays   Keep boost intake consistent.  Recheck in 1 week at [facility] office

## 2022-11-03 DIAGNOSIS — E1122 Type 2 diabetes mellitus with diabetic chronic kidney disease: Secondary | ICD-10-CM | POA: Diagnosis not present

## 2022-11-09 ENCOUNTER — Ambulatory Visit: Payer: Medicare HMO | Attending: Cardiovascular Disease | Admitting: *Deleted

## 2022-11-09 DIAGNOSIS — Z7901 Long term (current) use of anticoagulants: Secondary | ICD-10-CM

## 2022-11-09 DIAGNOSIS — Z952 Presence of prosthetic heart valve: Secondary | ICD-10-CM | POA: Diagnosis not present

## 2022-11-09 LAB — POCT INR: INR: 2 (ref 2.0–3.0)

## 2022-11-09 NOTE — Patient Instructions (Signed)
Take warfarin 1 1/2 tablets tonight then increase dose to 1 tablet daily except 1/2 tablet on Fridays   Keep boost intake consistent.  Recheck in 1 week at [facility] office  Waiting on Abx for Bacterial infection in urine Wants to get back to having INR checked with labs at [facility] every 2 wks

## 2022-11-10 ENCOUNTER — Encounter: Payer: Self-pay | Admitting: Hematology and Oncology

## 2022-11-10 ENCOUNTER — Other Ambulatory Visit: Payer: Self-pay

## 2022-11-10 ENCOUNTER — Inpatient Hospital Stay: Payer: Medicare HMO | Attending: Oncology

## 2022-11-10 ENCOUNTER — Inpatient Hospital Stay: Payer: Medicare HMO

## 2022-11-10 ENCOUNTER — Inpatient Hospital Stay (HOSPITAL_BASED_OUTPATIENT_CLINIC_OR_DEPARTMENT_OTHER): Payer: Medicare HMO | Admitting: Hematology and Oncology

## 2022-11-10 VITALS — BP 148/75 | HR 80 | Temp 97.6°F | Resp 16 | Ht 60.0 in | Wt 109.8 lb

## 2022-11-10 DIAGNOSIS — M8580 Other specified disorders of bone density and structure, unspecified site: Secondary | ICD-10-CM | POA: Diagnosis not present

## 2022-11-10 DIAGNOSIS — Z7901 Long term (current) use of anticoagulants: Secondary | ICD-10-CM | POA: Insufficient documentation

## 2022-11-10 DIAGNOSIS — R600 Localized edema: Secondary | ICD-10-CM | POA: Diagnosis not present

## 2022-11-10 DIAGNOSIS — D591 Autoimmune hemolytic anemia, unspecified: Secondary | ICD-10-CM

## 2022-11-10 DIAGNOSIS — D649 Anemia, unspecified: Secondary | ICD-10-CM | POA: Diagnosis not present

## 2022-11-10 DIAGNOSIS — I7782 Antineutrophilic cytoplasmic antibody (ANCA) vasculitis: Secondary | ICD-10-CM | POA: Diagnosis not present

## 2022-11-10 DIAGNOSIS — D638 Anemia in other chronic diseases classified elsewhere: Secondary | ICD-10-CM

## 2022-11-10 DIAGNOSIS — D594 Other nonautoimmune hemolytic anemias: Secondary | ICD-10-CM

## 2022-11-10 LAB — PROTIME-INR
INR: 2.2 — ABNORMAL HIGH (ref 0.8–1.2)
Prothrombin Time: 24.2 seconds — ABNORMAL HIGH (ref 11.4–15.2)

## 2022-11-10 LAB — CBC WITH DIFFERENTIAL (CANCER CENTER ONLY)
Abs Immature Granulocytes: 0.01 10*3/uL (ref 0.00–0.07)
Basophils Absolute: 0.1 10*3/uL (ref 0.0–0.1)
Basophils Relative: 1 %
Eosinophils Absolute: 0.5 10*3/uL (ref 0.0–0.5)
Eosinophils Relative: 6 %
HCT: 35.8 % — ABNORMAL LOW (ref 36.0–46.0)
Hemoglobin: 11.9 g/dL — ABNORMAL LOW (ref 12.0–15.0)
Immature Granulocytes: 0 %
Lymphocytes Relative: 21 %
Lymphs Abs: 1.5 10*3/uL (ref 0.7–4.0)
MCH: 30 pg (ref 26.0–34.0)
MCHC: 33.2 g/dL (ref 30.0–36.0)
MCV: 90.2 fL (ref 80.0–100.0)
Monocytes Absolute: 0.7 10*3/uL (ref 0.1–1.0)
Monocytes Relative: 10 %
Neutro Abs: 4.3 10*3/uL (ref 1.7–7.7)
Neutrophils Relative %: 62 %
Platelet Count: 153 10*3/uL (ref 150–400)
RBC: 3.97 MIL/uL (ref 3.87–5.11)
RDW: 17.5 % — ABNORMAL HIGH (ref 11.5–15.5)
WBC Count: 7 10*3/uL (ref 4.0–10.5)
nRBC: 0 % (ref 0.0–0.2)

## 2022-11-10 LAB — CMP (CANCER CENTER ONLY)
ALT: 20 U/L (ref 0–44)
AST: 49 U/L — ABNORMAL HIGH (ref 15–41)
Albumin: 3.8 g/dL (ref 3.5–5.0)
Alkaline Phosphatase: 116 U/L (ref 38–126)
Anion gap: 11 (ref 5–15)
BUN: 48 mg/dL — ABNORMAL HIGH (ref 8–23)
CO2: 28 mmol/L (ref 22–32)
Calcium: 10 mg/dL (ref 8.9–10.3)
Chloride: 100 mmol/L (ref 98–111)
Creatinine: 2.31 mg/dL — ABNORMAL HIGH (ref 0.44–1.00)
GFR, Estimated: 20 mL/min — ABNORMAL LOW (ref >60)
Glucose, Bld: 182 mg/dL — ABNORMAL HIGH (ref 70–99)
Potassium: 4.1 mmol/L (ref 3.5–5.1)
Sodium: 139 mmol/L (ref 135–145)
Total Bilirubin: 0.7 mg/dL (ref 0.3–1.2)
Total Protein: 6.1 g/dL — ABNORMAL LOW (ref 6.5–8.1)

## 2022-11-10 LAB — SAMPLE TO BLOOD BANK

## 2022-11-10 LAB — RETIC PANEL
Immature Retic Fract: 7.7 % (ref 2.3–15.9)
RBC.: 3.97 MIL/uL (ref 3.87–5.11)
Retic Count, Absolute: 46.8 10*3/uL (ref 19.0–186.0)
Retic Ct Pct: 1.2 % (ref 0.4–3.1)
Reticulocyte Hemoglobin: 34.8 pg (ref >27.9)

## 2022-11-10 NOTE — Progress Notes (Signed)
[facility] Follow up:    [name], MD [place] [id number]  DIAGNOSIS: Hemolytic Anemia  SUMMARY OF HEMATOLOGIC HISTORY: DELORIS MOGER is a 82 y.o. [place] woman with multifactorial anemia, as follows:             (a) anemia of chronic inflammation: As of May 2020 she has a SED rate of 95, CRP 1.1, positive ANA and RF; subsequently she was found to be ANCA positive             (b) hemolysis (mild): she has a mildly elevated LDH and a DAT positive for complement, not IgG; this is c/w (a) above             (c) anemia of renal insufficiency: inadequate EPO resonse to anemia                         (d) GIB/ iron deficiency in patient on lifelong anticoagulaton   (1) Feraheme received 03/01/2019, repeated 03/20/2019              (a) reticulocyte 03/07/2019 up from 43.4 a year ago to 191.8 after iron infusion             (b) feraheme repeated on 05/12/2019             (c) subsequent ferritin levels >100   (2) darbepoetin: starting 05/05/2019 at 119mg given every 2 weeks             (a) dose increased to 200 mcg every week starting 07/01/2019              (b) back to every 2 weeks beginning 08/04/2019 still at 200 mcg dose             (c) switched to Retacrit every 4 weeks as of 08/18/2019   (3) ANCA positive vasculitis: diagnosed 05/08/2019 (titer 1:640)             (a) prednisone 40 mg daily started 05/31/2019             (b) rituximab weekly started 06/12/2019 (received test dose 06/05/2019)                         (i) hepatitis B sAg, core Ab and HIV negative 05/08/2019                         (ii) rituximab held after 07/14/2019 dose (received 5 weekly doses) with neutropenia                         (iii) rituximab resumed 08/18/2019 but changed to monthly                         (iv) rituximab changed to every 2 months beginning with 02/02/2020 dose                         (v) rituximab changed to every 12 weeks after the  10/12/2020 dose             (c) prednisone tapered to off as of 09/15/2019                          (4) Anemia worsened in  spring 2023, refractory to Rituximab and requiring prednisone restart with slow taper due currently at '20mg'$  which she will stay at.   (5) osteopenia with a T score of -1.5 on bone density 02/28/2016 at the [facility] center  CURRENT THERAPY: Daily Epo injection q 14 days  INTERVAL HISTORY:  DAYLYNN STUMPP 82 y.o. female returns for follow-up of her hemolytic anemia.  She is here with her daughter. Since last visit, she apparently has been dealing with a UTI.  Daughter was wondering if we can add levofloxacin to her allergies because she had severe nausea, vomiting, diarrhea with the levofloxacin and she has insomnia. She tells me that she feels so tired, has not been able to eat much, lost so much weight.  She does not feel well overall.  She lives with her daughter now. Rest of the pertinent 10 point ROS reviewed and negative  Patient Active Problem List   Diagnosis Date Noted   Acute on chronic heart failure with preserved ejection fraction (HFpEF) ([place]) 08/29/2022   Acute exacerbation of CHF (congestive heart failure) ([place]) 08/29/2022   Acute urinary retention 08/28/2022   Acute kidney injury superimposed on chronic kidney disease ([place]) 08/27/2022   Constipation 08/27/2022   Nausea 08/27/2022   Hypoalbuminemia due to protein-calorie malnutrition ([place]) 08/27/2022   Type 2 diabetes mellitus with hyperglycemia ([place]) 08/27/2022   Palliative care encounter    Goals of care, counseling/discussion    Encounter for therapeutic drug monitoring    GI bleed 07/09/2022   GI bleeding 07/08/2022   Acute on chronic anemia 07/08/2022   History of Clostridioides difficile colitis 07/08/2022   DNR (do not resuscitate)/DNI(Do Not Intubate) 07/08/2022   Anemia of chronic disease 07/07/2022   Pressure injury of skin 06/23/2022   UTI (urinary tract infection) 06/22/2022   Joint  pain 06/09/2022   Parietoalveolar pneumopathy ([place]) 06/09/2022   Primary osteoarthritis 06/09/2022   Rheumatoid factor positive 06/09/2022   C. difficile colitis 04/19/2022   Colitis 04/18/2022   PAH (pulmonary artery hypertension) ([place]) 04/18/2022   Emphysematous cystitis 04/13/2022   Female bladder prolapse 04/13/2022   Diverticulitis 04/12/2022   Autoimmune hemolytic anemia (HCC) 01/02/2022   Bilateral lower extremity edema 01/02/2022   Refractory anemia (HCC) 12/15/2021   Abnormal chest x-ray 12/15/2021   Supratherapeutic INR 12/15/2021   Elevated troponin 12/15/2021   Unilateral primary osteoarthritis, right hip 05/31/2021   Midline cystocele 05/30/2021   Primary localized osteoarthritis of pelvic region and thigh 05/30/2021   Vaginal pain 05/30/2021   Asymmetric SNHL (sensorineural hearing loss) 01/06/2021   Referred otalgia of both ears 01/06/2021   Bilateral hearing loss 08/31/2020   Low back pain 02/20/2020   CKD (chronic kidney disease), stage IV ([place]) 10/13/2019   Leukopenia due to antineoplastic chemotherapy ([place]) 08/18/2019   Thrombophilia ([place]) 06/19/2019   Encounter for general adult medical examination without abnormal findings 06/13/2019   Arteritis ([place]) 06/06/2019   Hypertensive heart and chronic kidney disease with heart failure and stage 1 through stage 4 chronic kidney disease, or unspecified chronic kidney disease ([place]) 06/06/2019   Muscle weakness 06/06/2019   Pancytopenia ([place]) 05/28/2019   Dyspnea 05/28/2019   Vasculitis, ANCA positive 05/28/2019   Disorder of connective tissue ([place]) 05/14/2019   Renal insufficiency 05/08/2019   PAF (paroxysmal atrial fibrillation) ([place]) 04/07/2019   Iron deficiency anemia 02/28/2019   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 ([place]) 02/28/2019   Diabetic retinopathy associated with type 2 diabetes mellitus ([place]) 09/02/2018   Chronic pain 06/19/2018  Non-thrombocytopenic purpura ([place]) 05/21/2018    Gout 03/22/2018   Malnutrition of moderate degree 03/12/2018   Diabetes mellitus type 2, controlled ([place]) 03/06/2018   Chronic atrial fibrillation 03/06/2018   H/O mechanical aortic valve replacement 03/06/2018   Moderate to severe pulmonary hypertension ([place]) 03/06/2018   GERD without esophagitis 02/21/2018   Anxiety 02/21/2018   Chronic diastolic CHF (congestive heart failure) ([place]) 02/21/2018   General weakness 02/02/2018   Thrombocytopenia ([place]) 01/25/2018   Arm pain, diffuse 01/25/2018   Anemia 01/23/2018   Aortic atherosclerosis ([place]) 01/23/2018   Age-related osteoporosis without current pathological fracture 05/24/2017   Carotid artery occlusion 05/24/2017   Generalized anxiety disorder 05/24/2017   Hearing loss 05/24/2017   Presence of prosthetic heart valve 05/24/2017   Carotid artery disease ([place]) 08/18/2013   Peripheral arterial disease ([place]) 08/18/2013   Diabetes mellitus without complication ([place]) 70/35/0093   Essential hypertension    Hypercholesteremia    Mechanical heart valve present    Chronic anticoagulation     is allergic to flagyl [metronidazole], augmentin [amoxicillin-pot clavulanate], ciprofloxacin, codeine, coreg [carvedilol], diflucan [fluconazole], levofloxacin in d5w, losartan, privigen [immune globulin (human)], sulfamethoxazole-trimethoprim, verapamil, zetia [ezetimibe], zocor [simvastatin - high dose], and gatifloxacin.  MEDICAL HISTORY: Past Medical History:  Diagnosis Date   Aortic atherosclerosis ([place]) 01/23/2018   Atrial fibrillation (HCC)    Benign positional vertigo 10/06/2011   Carotid artery disease (HCC)    Chemotherapy induced neutropenia ([place]) 07/21/2019   Cholelithiasis 01/23/2018   Cholelithiasis 01/23/2018   Chronic anticoagulation    Colovesical fistula, ruled out 04/14/2022   Coronary artery disease    status post coronary artery bypass grafting times 07/10/2004   Diabetes mellitus    Hypercholesteremia    Hypertension     Mechanical heart valve present    H. aortic valve replacement at the time of bypass surgery October 2005   Moderate to severe pulmonary hypertension ([place])    Peripheral arterial disease ([place])    history of left common iliac artery PTA and stenting for a chronic total occlusion 08/26/01   S/P cholecystectomy 03/06/2018    SURGICAL HISTORY: Past Surgical History:  Procedure Laterality Date   anterior repair  2009   AORTIC VALVE REPLACEMENT  2005   Dr. Cyndia Bent   CARDIAC CATHETERIZATION  11/10/2004   40% right common illiac, 70% in stent restenosis of distal left common illiac,    CARDIAC CATHETERIZATION  05/18/2004   LAD 50-70% midstenosis, RCA dominant w/50% stenosis, 50% Right common Illiac artery ostial stenosis, 90% in stent restenosis within midportion of left common illiac stent   Carotid Duplex  03/12/2012   RSA-elev. velocities suggestive of a 50-69% diameter reduction, Right&Left Bulb/Prox ICA-mild-mod.fibrous plaqueelevating Velocities abnormal study.   CHOLECYSTECTOMY N/A 03/01/2018   Procedure: LAPAROSCOPIC CHOLECYSTECTOMY;  Surgeon: Judeth Horn, MD;  Location: [facility];  Service: General;  Laterality: N/A;   COLONOSCOPY WITH PROPOFOL N/A 01/22/2018   Procedure: COLONOSCOPY WITH PROPOFOL;  Surgeon: Wilford Corner, MD;  Location: [facility];  Service: Endoscopy;  Laterality: N/A;   ESOPHAGOGASTRODUODENOSCOPY N/A 07/13/2022   Procedure: ESOPHAGOGASTRODUODENOSCOPY (EGD);  Surgeon: [name], MD;  Location[facility];  Service: Gastroenterology;  Laterality: N/A;   ESOPHAGOGASTRODUODENOSCOPY (EGD) WITH PROPOFOL N/A 01/22/2018   Procedure: ESOPHAGOGASTRODUODENOSCOPY (EGD) WITH PROPOFOL;  Surgeon: Wilford Corner, MD;  Location: [facility];  Service: Endoscopy;  Laterality: N/A;   ESOPHAGOGASTRODUODENOSCOPY (EGD) WITH PROPOFOL N/A 11/21/2018   Procedure: ESOPHAGOGASTRODUODENOSCOPY (EGD) WITH PROPOFOL;  Surgeon: [name], MD;  Location: [facility];  Service: Gastroenterology;  Laterality: N/A;   ESOPHAGOGASTRODUODENOSCOPY (EGD) WITH PROPOFOL Left 02/27/2019   Procedure: ESOPHAGOGASTRODUODENOSCOPY (EGD) WITH PROPOFOL;  Surgeon: Arta Silence, MD;  Location: [facility] END[facility]COPY;  Service: Endoscopy;  Laterality: Left;   GIVENS CAPSULE STUDY N/A 11/21/2018   Procedure: GIVENS CAPSULE STUDY;  Surgeon: [name], MD;  Location: [facility];  Service: Gastroenterology;  Laterality: N/A;  To be deployed during EGD   Lower Ext. Duplex  03/12/2012   Right Proximal CIA- vessel narrowing w/elevated velocities 0-49% diameter reduction. Right SFA-mild mixed density plaque throughout vessel.   NM MYOCAR PERF WALL MOTION  05/19/2010   protocol: Persantine, post stress EF 65%, negative for ischemia, low risk scan   RIGHT HEART CATH N/A 06/27/2018   Procedure: RIGHT HEART CATH;  Surgeon: [name], MD;  Location: [facility];  Service: Cardiovascular;  Laterality: N/A;   TOTAL ABDOMINAL HYSTERECTOMY W/ BILATERAL SALPINGOOPHORECTOMY  1989   TRANSTHORACIC ECHOCARDIOGRAM  08/29/2012   Moderately calcified annulus of mitral valve, moderate regurg. of both mitral valve and tricuspid valve.     SOCIAL HISTORY: Social History   Socioeconomic History   Marital status: Widowed    Spouse name: Not on file   Number of children: Not on file   Years of education: Not on file   Highest education level: Not on file  Occupational History   Occupation: Retired  Tobacco Use   Smoking status: Former    Packs/day: 1.00    Years: 30.00    Total pack years: 30.00    Types: Cigarettes   Smokeless tobacco: Never   Tobacco comments:    quit smoking 2005  Vaping Use   Vaping Use: Never used  Substance and Sexual Activity   Alcohol use: No    Alcohol/week: 0.0 standard drinks of alcohol   Drug use: No   Sexual activity: Not Currently    Birth control/protection: None  Other Topics Concern   Not on file  Social History Narrative   Not on file   Social Determinants of Health    Financial Resource Strain: Low Risk  (02/28/2022)   Overall Financial Resource Strain (CARDIA)    Difficulty of Paying Living Expenses: Not hard at all  Food Insecurity: No Food Insecurity (08/31/2022)   Hunger Vital Sign    Worried About Running Out of Food in the Last Year: Never true    [place] in the Last Year: Never true  Transportation Needs: No Transportation Needs (08/31/2022)   PRAPARE - Hydrologist (Medical): No    Lack of Transportation (Non-Medical): No  Physical Activity: Inactive (02/28/2022)   Exercise Vital Sign    Days of Exercise per Week: 0 days    Minutes of Exercise per Session: 0 min  Stress: No Stress Concern Present (02/28/2022)   [facility]    Feeling of Stress : Only a little  Social Connections: Moderately Integrated (06/22/2022)   Social Connection and Isolation Panel [NHANES]    Frequency of Communication with Friends and Family: More than three times a week    Frequency of Social Gatherings with Friends and Family: More than three times a week    Attends Religious Services: More than 4 times per year    Active Member of [employer] or Organizations: Yes    Attends Archivist Meetings: More than 4 times per year    Marital Status: Widowed  Intimate Partner Violence: Not At Risk (  08/31/2022)   Humiliation, Afraid, Rape, and Kick questionnaire    Fear of Current or Ex-Partner: No    Emotionally Abused: No    Physically Abused: No    Sexually Abused: No    FAMILY HISTORY: Family History  Problem Relation Age of Onset   Heart disease Father    Hypertension Father    Hyperlipidemia Father    Breast cancer Neg Hx     Review of Systems  Constitutional:  Positive for fatigue. Negative for appetite change, chills, fever and unexpected weight change.       She is in no acute distress but appears to have lost so much weight overall  HENT:    Negative for hearing loss, lump/mass, mouth sores, trouble swallowing and voice change.   Eyes:  Negative for eye problems and icterus.  Respiratory:  Negative for chest tightness, cough and shortness of breath.   Cardiovascular:  Negative for chest pain, leg swelling (Leg swelling has improved remarkably) and palpitations.  Gastrointestinal:  Negative for abdominal distention, abdominal pain, blood in stool, constipation, diarrhea, nausea, rectal pain and vomiting.  Endocrine: Negative for hot flashes.  Genitourinary:  Negative for difficulty urinating.   Musculoskeletal:  Negative for arthralgias.  Skin:  Negative for itching and rash.  Neurological:  Negative for dizziness, extremity weakness, headaches and numbness.  Hematological:  Negative for adenopathy. Does not bruise/bleed easily.  Psychiatric/Behavioral:  Negative for depression. The patient is not nervous/anxious.       PHYSICAL EXAMINATION  ECOG PERFORMANCE STATUS: 1 - Symptomatic but completely ambulatory  Vitals:   11/10/22 1255  BP: (!) 148/75  Pulse: 80  Resp: 16  Temp: 97.6 F (36.4 C)  SpO2: 98%    Physical Exam Constitutional:      General: She is not in acute distress.    Appearance: Normal appearance. She is not toxic-appearing.  HENT:     Head: Normocephalic and atraumatic.  Eyes:     General: No scleral icterus. Cardiovascular:     Rate and Rhythm: Normal rate.     Pulses: Normal pulses.     Heart sounds: Murmur (Systolic murmur with a click.) heard.  Pulmonary:     Effort: Pulmonary effort is normal.     Breath sounds: No rhonchi.  Musculoskeletal:        General: No swelling (Bilateral lower extremity edema significantly improved.  No evidence of cellulitis).     Cervical back: Neck supple.     Right lower leg: Edema (significantly better) present.     Left lower leg: Edema (significantly better) present.  Lymphadenopathy:     Cervical: No cervical adenopathy.  Skin:    General: Skin is  warm and dry.     Findings: No bruising or rash.  Neurological:     General: No focal deficit present.     Mental Status: She is alert.  Psychiatric:        Mood and Affect: Mood normal.        Behavior: Behavior normal.     LABORATORY DATA:  CBC    Component Value Date/Time   WBC 7.0 11/10/2022 1225   WBC 7.8 10/27/2022 1305   RBC 3.97 11/10/2022 1225   RBC 3.97 11/10/2022 1225   HGB 11.9 (L) 11/10/2022 1225   HCT 35.8 (L) 11/10/2022 1225   HCT 28.5 (L) 11/20/2018 1824   PLT 153 11/10/2022 1225   MCV 90.2 11/10/2022 1225   MCH 30.0 11/10/2022 1225   MCHC 33.2 11/10/2022  1225   RDW 17.5 (H) 11/10/2022 1225   LYMPHSABS 1.5 11/10/2022 1225   MONOABS 0.7 11/10/2022 1225   EOSABS 0.5 11/10/2022 1225   BASOSABS 0.1 11/10/2022 1225    CMP     Component Value Date/Time   NA 139 11/10/2022 1225   NA 137 12/06/2017 1436   K 4.1 11/10/2022 1225   CL 100 11/10/2022 1225   CO2 28 11/10/2022 1225   GLUCOSE 182 (H) 11/10/2022 1225   BUN 48 (H) 11/10/2022 1225   BUN 20 12/06/2017 1436   CREATININE 2.31 (H) 11/10/2022 1225   CALCIUM 10.0 11/10/2022 1225   PROT 6.1 (L) 11/10/2022 1225   ALBUMIN 3.8 11/10/2022 1225   AST 49 (H) 11/10/2022 1225   ALT 20 11/10/2022 1225   ALKPHOS 116 11/10/2022 1225   BILITOT 0.7 11/10/2022 1225   GFRNONAA 20 (L) 11/10/2022 1225   GFRAA 32 (L) 03/30/2020 0932    ASSESSMENT and THERAPY PLAN:    LANYAH SPENGLER is a 82 y.o. female who presents for multifactorial anemia, previously treated with rituximab for ANCA positive hemolytic anemia, previously on maintenance rituximab every 12 weeks presented with worsening anemia thought to be related to her hemolysis.  She was diagnosed with elevated ANCA titers 1 is to 640 and previously treated for ANCA positive hemolytic anemia, seen by rheumatology Dr. Salome Holmes back in 2020, responded at that time to prednisone and rituximab and continued on rituximab maintenance.  However her rituximab was  changed to every 81-monthinfusions and she relapsed before her second cycle of every 334-monthituximab.    # Multifactorial Anemia:  --Bone marrow biopsy from 02/20/2022 showed normocellular marrow with trilineage hematopoiesis. No leukemia, lymphoma or high-grade dysplasia identified. --CT CAP from 02/20/2022 did not show any underlying cause for anemia.  --ANCA titers are normal when checked on 02/10/2022 -- Currently on EPO every 2 weeks.  She has not required the EPO shot in the past 4 weeks.  She is also dealing with a lot of other ongoing health issues hence we have discussed about changing the lab appointment and follow-up to every 4 weeks instead of every 2 weeks.  I have [facility] if this is a possibility.  #Bilateral lower extremity edema significantly improved compared to last visit.    #Warfarin therapy for [employer]. Jude's valve --Under the care of  [facility].  She will be titrated according to the protocol with the patient. --INR today pending.  Follow up: --Patient will return in 4 weeks for labs and erythropoietin injection. -- Will [place] to do a [place] 2 [place] Patient of course was encouraged to call [place] any new questions or concerns.  I have spent a total of 30 minutes minutes of face-to-face and non-face-to-face time, preparing to see the patient, performing a medically appropriate examination, counseling and educating the patient, documenting clinical information in the electronic health record,and care coordination.   PrBenay PikeD

## 2022-11-13 DIAGNOSIS — I130 Hypertensive heart and chronic kidney disease with heart failure and stage 1 through stage 4 chronic kidney disease, or unspecified chronic kidney disease: Secondary | ICD-10-CM | POA: Diagnosis not present

## 2022-11-13 DIAGNOSIS — E1122 Type 2 diabetes mellitus with diabetic chronic kidney disease: Secondary | ICD-10-CM | POA: Diagnosis not present

## 2022-11-13 DIAGNOSIS — J189 Pneumonia, unspecified organism: Secondary | ICD-10-CM | POA: Diagnosis not present

## 2022-11-13 DIAGNOSIS — E1151 Type 2 diabetes mellitus with diabetic peripheral angiopathy without gangrene: Secondary | ICD-10-CM | POA: Diagnosis not present

## 2022-11-13 DIAGNOSIS — D631 Anemia in chronic kidney disease: Secondary | ICD-10-CM | POA: Diagnosis not present

## 2022-11-13 DIAGNOSIS — N184 Chronic kidney disease, stage 4 (severe): Secondary | ICD-10-CM | POA: Diagnosis not present

## 2022-11-13 DIAGNOSIS — I5033 Acute on chronic diastolic (congestive) heart failure: Secondary | ICD-10-CM | POA: Diagnosis not present

## 2022-11-13 DIAGNOSIS — I482 Chronic atrial fibrillation, unspecified: Secondary | ICD-10-CM | POA: Diagnosis not present

## 2022-11-13 DIAGNOSIS — N179 Acute kidney failure, unspecified: Secondary | ICD-10-CM | POA: Diagnosis not present

## 2022-11-13 NOTE — Progress Notes (Signed)
INR result note.  See coumadin note.

## 2022-11-14 ENCOUNTER — Encounter: Payer: Self-pay | Admitting: Hematology and Oncology

## 2022-11-14 DIAGNOSIS — R8271 Bacteriuria: Secondary | ICD-10-CM | POA: Diagnosis not present

## 2022-11-14 DIAGNOSIS — N3000 Acute cystitis without hematuria: Secondary | ICD-10-CM | POA: Diagnosis not present

## 2022-11-16 ENCOUNTER — Ambulatory Visit: Payer: Medicare HMO | Attending: Cardiovascular Disease | Admitting: *Deleted

## 2022-11-16 DIAGNOSIS — Z7901 Long term (current) use of anticoagulants: Secondary | ICD-10-CM

## 2022-11-16 DIAGNOSIS — Z952 Presence of prosthetic heart valve: Secondary | ICD-10-CM

## 2022-11-16 LAB — POCT INR: INR: 2 (ref 2.0–3.0)

## 2022-11-16 NOTE — Patient Instructions (Signed)
Increase warfarin to 1 tablet daily except 1 1/2 tablets on Thursdays   Keep boost intake consistent.  Recheck in 1 week at [facility] office  On Fosfomycin q72 hours x 2 doses. Due tomorrow night Wants to get back to having INR checked with labs at [place] every 2 wks Scheduled to have Hgb checked at [facility] next wk.  Will check INR then.

## 2022-11-21 ENCOUNTER — Other Ambulatory Visit: Payer: Self-pay | Admitting: *Deleted

## 2022-11-22 ENCOUNTER — Ambulatory Visit (INDEPENDENT_AMBULATORY_CARE_PROVIDER_SITE_OTHER): Payer: Medicare HMO | Admitting: *Deleted

## 2022-11-22 ENCOUNTER — Inpatient Hospital Stay: Payer: Medicare HMO

## 2022-11-22 ENCOUNTER — Other Ambulatory Visit (HOSPITAL_COMMUNITY)
Admission: RE | Admit: 2022-11-22 | Discharge: 2022-11-22 | Disposition: A | Discharge status: Home / Self Care | Payer: Medicare HMO | Source: Ambulatory Visit | Attending: Cardiology | Admitting: Cardiology

## 2022-11-22 DIAGNOSIS — Z7901 Long term (current) use of anticoagulants: Secondary | ICD-10-CM | POA: Insufficient documentation

## 2022-11-22 DIAGNOSIS — Z5181 Encounter for therapeutic drug level monitoring: Secondary | ICD-10-CM | POA: Diagnosis not present

## 2022-11-22 DIAGNOSIS — D649 Anemia, unspecified: Secondary | ICD-10-CM | POA: Diagnosis not present

## 2022-11-22 DIAGNOSIS — I482 Chronic atrial fibrillation, unspecified: Secondary | ICD-10-CM

## 2022-11-22 DIAGNOSIS — I7782 Antineutrophilic cytoplasmic antibody (ANCA) vasculitis: Secondary | ICD-10-CM | POA: Diagnosis not present

## 2022-11-22 DIAGNOSIS — D591 Autoimmune hemolytic anemia, unspecified: Secondary | ICD-10-CM | POA: Diagnosis not present

## 2022-11-22 DIAGNOSIS — M8580 Other specified disorders of bone density and structure, unspecified site: Secondary | ICD-10-CM | POA: Diagnosis not present

## 2022-11-22 DIAGNOSIS — D594 Other nonautoimmune hemolytic anemias: Secondary | ICD-10-CM

## 2022-11-22 LAB — CBC WITH DIFFERENTIAL/PLATELET
Abs Immature Granulocytes: 0.02 10*3/uL (ref 0.00–0.07)
Basophils Absolute: 0 10*3/uL (ref 0.0–0.1)
Basophils Relative: 1 %
Eosinophils Absolute: 0.5 10*3/uL (ref 0.0–0.5)
Eosinophils Relative: 10 %
HCT: 29.2 % — ABNORMAL LOW (ref 36.0–46.0)
Hemoglobin: 9.5 g/dL — ABNORMAL LOW (ref 12.0–15.0)
Immature Granulocytes: 0 %
Lymphocytes Relative: 24 %
Lymphs Abs: 1.3 10*3/uL (ref 0.7–4.0)
MCH: 30 pg (ref 26.0–34.0)
MCHC: 32.5 g/dL (ref 30.0–36.0)
MCV: 92.1 fL (ref 80.0–100.0)
Monocytes Absolute: 0.6 10*3/uL (ref 0.1–1.0)
Monocytes Relative: 11 %
Neutro Abs: 3 10*3/uL (ref 1.7–7.7)
Neutrophils Relative %: 54 %
Platelets: 150 10*3/uL (ref 150–400)
RBC: 3.17 MIL/uL — ABNORMAL LOW (ref 3.87–5.11)
RDW: 17 % — ABNORMAL HIGH (ref 11.5–15.5)
WBC: 5.4 10*3/uL (ref 4.0–10.5)
nRBC: 0 % (ref 0.0–0.2)

## 2022-11-22 LAB — COMPREHENSIVE METABOLIC PANEL
ALT: 23 U/L (ref 0–44)
AST: 63 U/L — ABNORMAL HIGH (ref 15–41)
Albumin: 3.3 g/dL — ABNORMAL LOW (ref 3.5–5.0)
Alkaline Phosphatase: 95 U/L (ref 38–126)
Anion gap: 12 (ref 5–15)
BUN: 63 mg/dL — ABNORMAL HIGH (ref 8–23)
CO2: 23 mmol/L (ref 22–32)
Calcium: 9.1 mg/dL (ref 8.9–10.3)
Chloride: 98 mmol/L (ref 98–111)
Creatinine, Ser: 2.25 mg/dL — ABNORMAL HIGH (ref 0.44–1.00)
GFR, Estimated: 21 mL/min — ABNORMAL LOW (ref >60)
Glucose, Bld: 230 mg/dL — ABNORMAL HIGH (ref 70–99)
Potassium: 4 mmol/L (ref 3.5–5.1)
Sodium: 133 mmol/L — ABNORMAL LOW (ref 135–145)
Total Bilirubin: 0.7 mg/dL (ref 0.3–1.2)
Total Protein: 5.7 g/dL — ABNORMAL LOW (ref 6.5–8.1)

## 2022-11-22 LAB — PROTIME-INR
INR: 3.7 — ABNORMAL HIGH (ref 0.8–1.2)
Prothrombin Time: 36 seconds — ABNORMAL HIGH (ref 11.4–15.2)

## 2022-11-22 LAB — RETIC PANEL
Immature Retic Fract: 8.5 % (ref 2.3–15.9)
RBC.: 3.25 MIL/uL — ABNORMAL LOW (ref 3.87–5.11)
Retic Count, Absolute: 63.4 10*3/uL (ref 19.0–186.0)
Retic Ct Pct: 2 % (ref 0.4–3.1)
Reticulocyte Hemoglobin: 34.1 pg (ref >27.9)

## 2022-11-22 LAB — SAMPLE TO BLOOD BANK

## 2022-11-22 NOTE — Patient Instructions (Signed)
Hold warfarin tonight then decrease dose to 1 tablet daily  Keep boost intake consistent.  Recheck in [place] in 1 week Wants to get back to having INR checked with labs at [facility] every 2 wks Goes back 12/08/22)

## 2022-11-23 DIAGNOSIS — I130 Hypertensive heart and chronic kidney disease with heart failure and stage 1 through stage 4 chronic kidney disease, or unspecified chronic kidney disease: Secondary | ICD-10-CM | POA: Diagnosis not present

## 2022-11-23 DIAGNOSIS — I5033 Acute on chronic diastolic (congestive) heart failure: Secondary | ICD-10-CM | POA: Diagnosis not present

## 2022-11-23 DIAGNOSIS — E1151 Type 2 diabetes mellitus with diabetic peripheral angiopathy without gangrene: Secondary | ICD-10-CM | POA: Diagnosis not present

## 2022-11-23 DIAGNOSIS — E1122 Type 2 diabetes mellitus with diabetic chronic kidney disease: Secondary | ICD-10-CM | POA: Diagnosis not present

## 2022-11-23 DIAGNOSIS — N184 Chronic kidney disease, stage 4 (severe): Secondary | ICD-10-CM | POA: Diagnosis not present

## 2022-11-23 DIAGNOSIS — D631 Anemia in chronic kidney disease: Secondary | ICD-10-CM | POA: Diagnosis not present

## 2022-11-23 DIAGNOSIS — J189 Pneumonia, unspecified organism: Secondary | ICD-10-CM | POA: Diagnosis not present

## 2022-11-23 DIAGNOSIS — I482 Chronic atrial fibrillation, unspecified: Secondary | ICD-10-CM | POA: Diagnosis not present

## 2022-11-23 DIAGNOSIS — N179 Acute kidney failure, unspecified: Secondary | ICD-10-CM | POA: Diagnosis not present

## 2022-11-24 DIAGNOSIS — D509 Iron deficiency anemia, unspecified: Secondary | ICD-10-CM | POA: Diagnosis not present

## 2022-11-24 DIAGNOSIS — E7800 Pure hypercholesterolemia, unspecified: Secondary | ICD-10-CM | POA: Diagnosis not present

## 2022-11-24 DIAGNOSIS — R338 Other retention of urine: Secondary | ICD-10-CM | POA: Diagnosis not present

## 2022-11-24 DIAGNOSIS — N184 Chronic kidney disease, stage 4 (severe): Secondary | ICD-10-CM | POA: Diagnosis not present

## 2022-11-24 DIAGNOSIS — R7989 Other specified abnormal findings of blood chemistry: Secondary | ICD-10-CM | POA: Diagnosis not present

## 2022-11-24 DIAGNOSIS — I130 Hypertensive heart and chronic kidney disease with heart failure and stage 1 through stage 4 chronic kidney disease, or unspecified chronic kidney disease: Secondary | ICD-10-CM | POA: Diagnosis not present

## 2022-11-24 DIAGNOSIS — I87311 Chronic venous hypertension (idiopathic) with ulcer of right lower extremity: Secondary | ICD-10-CM | POA: Diagnosis not present

## 2022-11-24 DIAGNOSIS — I5032 Chronic diastolic (congestive) heart failure: Secondary | ICD-10-CM | POA: Diagnosis not present

## 2022-11-24 DIAGNOSIS — L97919 Non-pressure chronic ulcer of unspecified part of right lower leg with unspecified severity: Secondary | ICD-10-CM | POA: Diagnosis not present

## 2022-11-24 DIAGNOSIS — E1151 Type 2 diabetes mellitus with diabetic peripheral angiopathy without gangrene: Secondary | ICD-10-CM | POA: Diagnosis not present

## 2022-11-24 DIAGNOSIS — K219 Gastro-esophageal reflux disease without esophagitis: Secondary | ICD-10-CM | POA: Diagnosis not present

## 2022-11-24 DIAGNOSIS — D649 Anemia, unspecified: Secondary | ICD-10-CM | POA: Diagnosis not present

## 2022-11-24 DIAGNOSIS — I482 Chronic atrial fibrillation, unspecified: Secondary | ICD-10-CM | POA: Diagnosis not present

## 2022-11-24 DIAGNOSIS — I7782 Antineutrophilic cytoplasmic antibody (ANCA) vasculitis: Secondary | ICD-10-CM | POA: Diagnosis not present

## 2022-11-24 DIAGNOSIS — M109 Gout, unspecified: Secondary | ICD-10-CM | POA: Diagnosis not present

## 2022-11-24 DIAGNOSIS — M810 Age-related osteoporosis without current pathological fracture: Secondary | ICD-10-CM | POA: Diagnosis not present

## 2022-11-24 DIAGNOSIS — I2510 Atherosclerotic heart disease of native coronary artery without angina pectoris: Secondary | ICD-10-CM | POA: Diagnosis not present

## 2022-11-27 DIAGNOSIS — I130 Hypertensive heart and chronic kidney disease with heart failure and stage 1 through stage 4 chronic kidney disease, or unspecified chronic kidney disease: Secondary | ICD-10-CM | POA: Diagnosis not present

## 2022-11-27 DIAGNOSIS — E1122 Type 2 diabetes mellitus with diabetic chronic kidney disease: Secondary | ICD-10-CM | POA: Diagnosis not present

## 2022-11-27 DIAGNOSIS — D631 Anemia in chronic kidney disease: Secondary | ICD-10-CM | POA: Diagnosis not present

## 2022-11-27 DIAGNOSIS — N184 Chronic kidney disease, stage 4 (severe): Secondary | ICD-10-CM | POA: Diagnosis not present

## 2022-11-27 DIAGNOSIS — I5033 Acute on chronic diastolic (congestive) heart failure: Secondary | ICD-10-CM | POA: Diagnosis not present

## 2022-11-27 DIAGNOSIS — N179 Acute kidney failure, unspecified: Secondary | ICD-10-CM | POA: Diagnosis not present

## 2022-11-27 DIAGNOSIS — J189 Pneumonia, unspecified organism: Secondary | ICD-10-CM | POA: Diagnosis not present

## 2022-11-27 DIAGNOSIS — I482 Chronic atrial fibrillation, unspecified: Secondary | ICD-10-CM | POA: Diagnosis not present

## 2022-11-27 DIAGNOSIS — E1151 Type 2 diabetes mellitus with diabetic peripheral angiopathy without gangrene: Secondary | ICD-10-CM | POA: Diagnosis not present

## 2022-12-01 DIAGNOSIS — Z0000 Encounter for general adult medical examination without abnormal findings: Secondary | ICD-10-CM | POA: Diagnosis not present

## 2022-12-01 DIAGNOSIS — R82998 Other abnormal findings in urine: Secondary | ICD-10-CM | POA: Diagnosis not present

## 2022-12-01 DIAGNOSIS — Z952 Presence of prosthetic heart valve: Secondary | ICD-10-CM | POA: Diagnosis not present

## 2022-12-01 DIAGNOSIS — I130 Hypertensive heart and chronic kidney disease with heart failure and stage 1 through stage 4 chronic kidney disease, or unspecified chronic kidney disease: Secondary | ICD-10-CM | POA: Diagnosis not present

## 2022-12-01 DIAGNOSIS — K219 Gastro-esophageal reflux disease without esophagitis: Secondary | ICD-10-CM | POA: Diagnosis not present

## 2022-12-01 DIAGNOSIS — N184 Chronic kidney disease, stage 4 (severe): Secondary | ICD-10-CM | POA: Diagnosis not present

## 2022-12-01 DIAGNOSIS — E11319 Type 2 diabetes mellitus with unspecified diabetic retinopathy without macular edema: Secondary | ICD-10-CM | POA: Diagnosis not present

## 2022-12-01 DIAGNOSIS — Z1339 Encounter for screening examination for other mental health and behavioral disorders: Secondary | ICD-10-CM | POA: Diagnosis not present

## 2022-12-01 DIAGNOSIS — Z7901 Long term (current) use of anticoagulants: Secondary | ICD-10-CM | POA: Diagnosis not present

## 2022-12-01 DIAGNOSIS — M6281 Muscle weakness (generalized): Secondary | ICD-10-CM | POA: Diagnosis not present

## 2022-12-01 DIAGNOSIS — Z1331 Encounter for screening for depression: Secondary | ICD-10-CM | POA: Diagnosis not present

## 2022-12-01 DIAGNOSIS — E1151 Type 2 diabetes mellitus with diabetic peripheral angiopathy without gangrene: Secondary | ICD-10-CM | POA: Diagnosis not present

## 2022-12-01 DIAGNOSIS — I5032 Chronic diastolic (congestive) heart failure: Secondary | ICD-10-CM | POA: Diagnosis not present

## 2022-12-01 DIAGNOSIS — I7782 Antineutrophilic cytoplasmic antibody (ANCA) vasculitis: Secondary | ICD-10-CM | POA: Diagnosis not present

## 2022-12-06 DIAGNOSIS — E1122 Type 2 diabetes mellitus with diabetic chronic kidney disease: Secondary | ICD-10-CM | POA: Diagnosis not present

## 2022-12-06 DIAGNOSIS — J189 Pneumonia, unspecified organism: Secondary | ICD-10-CM | POA: Diagnosis not present

## 2022-12-06 DIAGNOSIS — I130 Hypertensive heart and chronic kidney disease with heart failure and stage 1 through stage 4 chronic kidney disease, or unspecified chronic kidney disease: Secondary | ICD-10-CM | POA: Diagnosis not present

## 2022-12-06 DIAGNOSIS — I5033 Acute on chronic diastolic (congestive) heart failure: Secondary | ICD-10-CM | POA: Diagnosis not present

## 2022-12-06 DIAGNOSIS — N184 Chronic kidney disease, stage 4 (severe): Secondary | ICD-10-CM | POA: Diagnosis not present

## 2022-12-06 DIAGNOSIS — D631 Anemia in chronic kidney disease: Secondary | ICD-10-CM | POA: Diagnosis not present

## 2022-12-06 DIAGNOSIS — N179 Acute kidney failure, unspecified: Secondary | ICD-10-CM | POA: Diagnosis not present

## 2022-12-06 DIAGNOSIS — E1151 Type 2 diabetes mellitus with diabetic peripheral angiopathy without gangrene: Secondary | ICD-10-CM | POA: Diagnosis not present

## 2022-12-06 DIAGNOSIS — I482 Chronic atrial fibrillation, unspecified: Secondary | ICD-10-CM | POA: Diagnosis not present

## 2022-12-07 ENCOUNTER — Ambulatory Visit: Payer: Medicare HMO | Attending: Cardiology | Admitting: *Deleted

## 2022-12-07 DIAGNOSIS — N184 Chronic kidney disease, stage 4 (severe): Secondary | ICD-10-CM | POA: Diagnosis not present

## 2022-12-07 DIAGNOSIS — I482 Chronic atrial fibrillation, unspecified: Secondary | ICD-10-CM | POA: Diagnosis not present

## 2022-12-07 DIAGNOSIS — I5033 Acute on chronic diastolic (congestive) heart failure: Secondary | ICD-10-CM | POA: Diagnosis not present

## 2022-12-07 DIAGNOSIS — J189 Pneumonia, unspecified organism: Secondary | ICD-10-CM | POA: Diagnosis not present

## 2022-12-07 DIAGNOSIS — N179 Acute kidney failure, unspecified: Secondary | ICD-10-CM | POA: Diagnosis not present

## 2022-12-07 DIAGNOSIS — Z952 Presence of prosthetic heart valve: Secondary | ICD-10-CM | POA: Diagnosis not present

## 2022-12-07 DIAGNOSIS — I130 Hypertensive heart and chronic kidney disease with heart failure and stage 1 through stage 4 chronic kidney disease, or unspecified chronic kidney disease: Secondary | ICD-10-CM | POA: Diagnosis not present

## 2022-12-07 DIAGNOSIS — Z7901 Long term (current) use of anticoagulants: Secondary | ICD-10-CM | POA: Diagnosis not present

## 2022-12-07 DIAGNOSIS — E1151 Type 2 diabetes mellitus with diabetic peripheral angiopathy without gangrene: Secondary | ICD-10-CM | POA: Diagnosis not present

## 2022-12-07 DIAGNOSIS — D631 Anemia in chronic kidney disease: Secondary | ICD-10-CM | POA: Diagnosis not present

## 2022-12-07 DIAGNOSIS — E1122 Type 2 diabetes mellitus with diabetic chronic kidney disease: Secondary | ICD-10-CM | POA: Diagnosis not present

## 2022-12-07 LAB — POCT INR: POC INR: 1.8

## 2022-12-07 NOTE — Patient Instructions (Signed)
Description   Take 1.5 tablets of warfarin today and tomorrow Then continue to take warfarin 1 tablet daily. Recheck INR in 1 week.

## 2022-12-08 ENCOUNTER — Inpatient Hospital Stay: Payer: Medicare HMO | Attending: Oncology

## 2022-12-08 ENCOUNTER — Other Ambulatory Visit: Payer: Self-pay

## 2022-12-08 ENCOUNTER — Inpatient Hospital Stay: Payer: Medicare HMO

## 2022-12-08 ENCOUNTER — Inpatient Hospital Stay (HOSPITAL_BASED_OUTPATIENT_CLINIC_OR_DEPARTMENT_OTHER): Payer: Medicare HMO | Admitting: Hematology and Oncology

## 2022-12-08 VITALS — BP 131/76 | HR 69 | Temp 97.6°F | Resp 16 | Ht 60.0 in | Wt 116.1 lb

## 2022-12-08 DIAGNOSIS — R600 Localized edema: Secondary | ICD-10-CM

## 2022-12-08 DIAGNOSIS — M8580 Other specified disorders of bone density and structure, unspecified site: Secondary | ICD-10-CM | POA: Insufficient documentation

## 2022-12-08 DIAGNOSIS — D591 Autoimmune hemolytic anemia, unspecified: Secondary | ICD-10-CM | POA: Diagnosis not present

## 2022-12-08 DIAGNOSIS — D638 Anemia in other chronic diseases classified elsewhere: Secondary | ICD-10-CM

## 2022-12-08 DIAGNOSIS — N184 Chronic kidney disease, stage 4 (severe): Secondary | ICD-10-CM | POA: Diagnosis not present

## 2022-12-08 DIAGNOSIS — I7782 Antineutrophilic cytoplasmic antibody (ANCA) vasculitis: Secondary | ICD-10-CM | POA: Insufficient documentation

## 2022-12-08 DIAGNOSIS — D631 Anemia in chronic kidney disease: Secondary | ICD-10-CM | POA: Insufficient documentation

## 2022-12-08 DIAGNOSIS — D500 Iron deficiency anemia secondary to blood loss (chronic): Secondary | ICD-10-CM

## 2022-12-08 DIAGNOSIS — Z7901 Long term (current) use of anticoagulants: Secondary | ICD-10-CM | POA: Insufficient documentation

## 2022-12-08 DIAGNOSIS — D594 Other nonautoimmune hemolytic anemias: Secondary | ICD-10-CM

## 2022-12-08 LAB — CBC WITH DIFFERENTIAL/PLATELET
Abs Immature Granulocytes: 0.02 10*3/uL (ref 0.00–0.07)
Basophils Absolute: 0.1 10*3/uL (ref 0.0–0.1)
Basophils Relative: 1 %
Eosinophils Absolute: 0.5 10*3/uL (ref 0.0–0.5)
Eosinophils Relative: 6 %
HCT: 29.2 % — ABNORMAL LOW (ref 36.0–46.0)
Hemoglobin: 9.7 g/dL — ABNORMAL LOW (ref 12.0–15.0)
Immature Granulocytes: 0 %
Lymphocytes Relative: 18 %
Lymphs Abs: 1.4 10*3/uL (ref 0.7–4.0)
MCH: 30.9 pg (ref 26.0–34.0)
MCHC: 33.2 g/dL (ref 30.0–36.0)
MCV: 93 fL (ref 80.0–100.0)
Monocytes Absolute: 0.8 10*3/uL (ref 0.1–1.0)
Monocytes Relative: 10 %
Neutro Abs: 4.9 10*3/uL (ref 1.7–7.7)
Neutrophils Relative %: 65 %
Platelets: 134 10*3/uL — ABNORMAL LOW (ref 150–400)
RBC: 3.14 MIL/uL — ABNORMAL LOW (ref 3.87–5.11)
RDW: 16.6 % — ABNORMAL HIGH (ref 11.5–15.5)
WBC: 7.6 10*3/uL (ref 4.0–10.5)
nRBC: 0 % (ref 0.0–0.2)

## 2022-12-08 LAB — CMP (CANCER CENTER ONLY)
ALT: 42 U/L (ref 0–44)
AST: 60 U/L — ABNORMAL HIGH (ref 15–41)
Albumin: 3.8 g/dL (ref 3.5–5.0)
Alkaline Phosphatase: 122 U/L (ref 38–126)
Anion gap: 11 (ref 5–15)
BUN: 78 mg/dL — ABNORMAL HIGH (ref 8–23)
CO2: 24 mmol/L (ref 22–32)
Calcium: 9.5 mg/dL (ref 8.9–10.3)
Chloride: 99 mmol/L (ref 98–111)
Creatinine: 2.32 mg/dL — ABNORMAL HIGH (ref 0.44–1.00)
GFR, Estimated: 20 mL/min — ABNORMAL LOW (ref >60)
Glucose, Bld: 153 mg/dL — ABNORMAL HIGH (ref 70–99)
Potassium: 4 mmol/L (ref 3.5–5.1)
Sodium: 134 mmol/L — ABNORMAL LOW (ref 135–145)
Total Bilirubin: 0.7 mg/dL (ref 0.3–1.2)
Total Protein: 6.5 g/dL (ref 6.5–8.1)

## 2022-12-08 LAB — PROTIME-INR
INR: 1.8 — ABNORMAL HIGH (ref 0.8–1.2)
Prothrombin Time: 20.8 seconds — ABNORMAL HIGH (ref 11.4–15.2)

## 2022-12-08 LAB — SAMPLE TO BLOOD BANK

## 2022-12-08 MED ORDER — EPOETIN ALFA-EPBX 40000 UNIT/ML IJ SOLN
40000.0000 [IU] | Freq: Once | INTRAMUSCULAR | Status: AC
  Administered 2022-12-08: 40000 [IU] via SUBCUTANEOUS
  Filled 2022-12-08: qty 1

## 2022-12-08 NOTE — Progress Notes (Signed)
[facility] up:    [name], MD [place] [address]  DIAGNOSIS: Anemia, multifactorial  SUMMARY OF HEMATOLOGIC HISTORY: [name] is a 82 y.o. [place] woman with multifactorial anemia, as follows:             (a) anemia of chronic inflammation: As of May 2020 she has a SED rate of 95, CRP 1.1, positive ANA and RF; subsequently she was found to be ANCA positive             (b) hemolysis (mild): she has a mildly elevated LDH and a DAT positive for complement, not IgG; this is c/w (a) above             (c) anemia of renal insufficiency: inadequate EPO resonse to anemia                         (d) GIB/ iron deficiency in patient on lifelong anticoagulaton   (1) Feraheme received 03/01/2019, repeated 03/20/2019              (a) reticulocyte 03/07/2019 up from 43.4 a year ago to 191.8 after iron infusion             (b) feraheme repeated on 05/12/2019             (c) subsequent ferritin levels >100   (2) darbepoetin: starting 05/05/2019 at 149mg given every 2 weeks             (a) dose increased to 200 mcg every week starting 07/01/2019              (b) back to every 2 weeks beginning 08/04/2019 still at 200 mcg dose             (c) switched to Retacrit every 4 weeks as of 08/18/2019   (3) ANCA positive vasculitis: diagnosed 05/08/2019 (titer 1:640)             (a) prednisone 40 mg daily started 05/31/2019             (b) rituximab weekly started 06/12/2019 (received test dose 06/05/2019)                         (i) hepatitis B sAg, core Ab and HIV negative 05/08/2019                         (ii) rituximab held after 07/14/2019 dose (received 5 weekly doses) with neutropenia                         (iii) rituximab resumed 08/18/2019 but changed to monthly                         (iv) rituximab changed to every 2 months beginning with 02/02/2020 dose                         (v) rituximab changed to every 12 weeks after  the 10/12/2020 dose             (c) prednisone tapered to off as of 09/15/2019                          (4) Anemia worsened in  spring 2023, refractory to Rituximab and requiring prednisone restart with slow taper due currently at '20mg'$  which she will stay at.   (5) osteopenia with a T score of -1.5 on bone density 02/28/2016 at the [facility] center  CURRENT THERAPY: Daily Epo injection q 14 days  INTERVAL HISTORY:  [name] 82 y.o. female returns for follow-up of anemia.  She is here with her son. Since her last visit, she denies any new health issues. She feels ok, lives with her daughter, leg swelling is not much of an issue.  Rest of the pertinent 10 point ROS reviewed and negative  Patient Active Problem List   Diagnosis Date Noted   Acute on chronic heart failure with preserved ejection fraction (HFpEF) ([place]) 08/29/2022   Acute exacerbation of CHF (congestive heart failure) ([place]) 08/29/2022   Acute urinary retention 08/28/2022   Acute kidney injury superimposed on chronic kidney disease ([place]) 08/27/2022   Constipation 08/27/2022   Nausea 08/27/2022   Hypoalbuminemia due to protein-calorie malnutrition ([place]) 08/27/2022   Type 2 diabetes mellitus with hyperglycemia ([place]) 08/27/2022   Palliative care encounter    Goals of care, counseling/discussion    Encounter for therapeutic drug monitoring    GI bleed 07/09/2022   GI bleeding 07/08/2022   Acute on chronic anemia 07/08/2022   History of Clostridioides difficile colitis 07/08/2022   DNR (do not resuscitate)/DNI(Do Not Intubate) 07/08/2022   Anemia of chronic disease 07/07/2022   Pressure injury of skin 06/23/2022   UTI (urinary tract infection) 06/22/2022   Joint pain 06/09/2022   Parietoalveolar pneumopathy ([place]) 06/09/2022   Primary osteoarthritis 06/09/2022   Rheumatoid factor positive 06/09/2022   C. difficile colitis 04/19/2022   Colitis 04/18/2022   PAH (pulmonary artery hypertension) ([place]) 04/18/2022    Emphysematous cystitis 04/13/2022   Female bladder prolapse 04/13/2022   Diverticulitis 04/12/2022   Autoimmune hemolytic anemia (HCC) 01/02/2022   Bilateral lower extremity edema 01/02/2022   Refractory anemia (HCC) 12/15/2021   Abnormal chest x-ray 12/15/2021   Supratherapeutic INR 12/15/2021   Elevated troponin 12/15/2021   Unilateral primary osteoarthritis, right hip 05/31/2021   Midline cystocele 05/30/2021   Primary localized osteoarthritis of pelvic region and thigh 05/30/2021   Vaginal pain 05/30/2021   Asymmetric SNHL (sensorineural hearing loss) 01/06/2021   Referred otalgia of both ears 01/06/2021   Bilateral hearing loss 08/31/2020   Low back pain 02/20/2020   CKD (chronic kidney disease), stage IV ([place]) 10/13/2019   Leukopenia due to antineoplastic chemotherapy ([place]) 08/18/2019   Thrombophilia ([place]) 06/19/2019   Encounter for general adult medical examination without abnormal findings 06/13/2019   Arteritis ([place]) 06/06/2019   Hypertensive heart and chronic kidney disease with heart failure and stage 1 through stage 4 chronic kidney disease, or unspecified chronic kidney disease ([place]) 06/06/2019   Muscle weakness 06/06/2019   Pancytopenia ([place]) 05/28/2019   Dyspnea 05/28/2019   Vasculitis, ANCA positive 05/28/2019   Disorder of connective tissue ([place]) 05/14/2019   Renal insufficiency 05/08/2019   PAF (paroxysmal atrial fibrillation) ([place]) 04/07/2019   Iron deficiency anemia 02/28/2019   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 ([place]) 02/28/2019   Diabetic retinopathy associated with type 2 diabetes mellitus ([place]) 09/02/2018   Chronic pain 06/19/2018   Non-thrombocytopenic purpura ([place]) 05/21/2018   Gout 03/22/2018   Malnutrition of moderate degree 03/12/2018   Diabetes mellitus type 2, controlled ([place]) 03/06/2018   Chronic atrial fibrillation 03/06/2018   H/O mechanical aortic valve replacement 03/06/2018   Moderate to severe pulmonary  hypertension ([place]) 03/06/2018   GERD without esophagitis 02/21/2018   Anxiety 02/21/2018   Chronic diastolic CHF (congestive heart failure) ([place]) 02/21/2018   General weakness 02/02/2018   Thrombocytopenia ([place]) 01/25/2018   Arm pain, diffuse 01/25/2018   Anemia 01/23/2018   Aortic atherosclerosis ([place]) 01/23/2018   Age-related osteoporosis without current pathological fracture 05/24/2017   Carotid artery occlusion 05/24/2017   Generalized anxiety disorder 05/24/2017   Hearing loss 05/24/2017   Presence of prosthetic heart valve 05/24/2017   Carotid artery disease ([place]) 08/18/2013   Peripheral arterial disease ([place]) 08/18/2013   Diabetes mellitus without complication ([place]) 0000000   Essential hypertension    Hypercholesteremia    Mechanical heart valve present    Chronic anticoagulation     is allergic to flagyl [metronidazole], augmentin [amoxicillin-pot clavulanate], ciprofloxacin, codeine, coreg [carvedilol], diflucan [fluconazole], levofloxacin in d5w, losartan, privigen [immune globulin (human)], sulfamethoxazole-trimethoprim, verapamil, zetia [ezetimibe], zocor [simvastatin - high dose], and gatifloxacin.  MEDICAL HISTORY: Past Medical History:  Diagnosis Date   Aortic atherosclerosis ([place]) 01/23/2018   Atrial fibrillation (HCC)    Benign positional vertigo 10/06/2011   Carotid artery disease (HCC)    Chemotherapy induced neutropenia ([place]) 07/21/2019   Cholelithiasis 01/23/2018   Cholelithiasis 01/23/2018   Chronic anticoagulation    Colovesical fistula, ruled out 04/14/2022   Coronary artery disease    status post coronary artery bypass grafting times 07/10/2004   Diabetes mellitus    Hypercholesteremia    Hypertension    Mechanical heart valve present    H. aortic valve replacement at the time of bypass surgery October 2005   Moderate to severe pulmonary hypertension ([place])    Peripheral arterial disease ([place])    history of left common iliac artery PTA and  stenting for a chronic total occlusion 08/26/01   S/P cholecystectomy 03/06/2018    SURGICAL HISTORY: Past Surgical History:  Procedure Laterality Date   anterior repair  2009   AORTIC VALVE REPLACEMENT  2005   Dr. Cyndia Bent   CARDIAC CATHETERIZATION  11/10/2004   40% right common illiac, 70% in stent restenosis of distal left common illiac,    CARDIAC CATHETERIZATION  05/18/2004   LAD 50-70% midstenosis, RCA dominant w/50% stenosis, 50% Right common Illiac artery ostial stenosis, 90% in stent restenosis within midportion of left common illiac stent   Carotid Duplex  03/12/2012   RSA-elev. velocities suggestive of a 50-69% diameter reduction, Right&Left Bulb/Prox ICA-mild-mod.fibrous plaqueelevating Velocities abnormal study.   CHOLECYSTECTOMY N/A 03/01/2018   Procedure: LAPAROSCOPIC CHOLECYSTECTOMY;  Surgeon: Judeth Horn, MD;  Location: [facility];  Service: General;  Laterality: N/A;   COLONOSCOPY WITH PROPOFOL N/A 01/22/2018   Procedure: COLONOSCOPY WITH PROPOFOL;  Surgeon: Wilford Corner, MD;  Location: [name];  Service: Endoscopy;  Laterality: N/A;   ESOPHAGOGASTRODUODENOSCOPY N/A 07/13/2022   Procedure: ESOPHAGOGASTRODUODENOSCOPY (EGD);  Surgeon: [name], MD;  Location: [facility];  Service: Gastroenterology;  Laterality: N/A;   ESOPHAGOGASTRODUODENOSCOPY (EGD) WITH PROPOFOL N/A 01/22/2018   Procedure: ESOPHAGOGASTRODUODENOSCOPY (EGD) WITH PROPOFOL;  Surgeon: Wilford Corner, MD;  Location: [facility];  Service: Endoscopy;  Laterality: N/A;   ESOPHAGOGASTRODUODENOSCOPY (EGD) WITH PROPOFOL N/A 11/21/2018   Procedure: ESOPHAGOGASTRODUODENOSCOPY (EGD) WITH PROPOFOL;  Surgeon: [name], MD;  Location: [facility];  Service: Gastroenterology;  Laterality: N/A;   ESOPHAGOGASTRODUODENOSCOPY (EGD) WITH PROPOFOL Left 02/27/2019   Procedure: ESOPHAGOGASTRODUODENOSCOPY (EGD) WITH PROPOFOL;  Surgeon: Arta Silence, MD;  Location: [facility];  Service: Endoscopy;  Laterality: Left;    GIVENS CAPSULE STUDY N/A 11/21/2018   Procedure: GIVENS CAPSULE  STUDY;  Surgeon: [name], MD;  Location: [facility];  Service: Gastroenterology;  Laterality: N/A;  To be deployed during EGD   Lower Ext. Duplex  03/12/2012   Right Proximal CIA- vessel narrowing w/elevated velocities 0-49% diameter reduction. Right SFA-mild mixed density plaque throughout vessel.   NM MYOCAR PERF WALL MOTION  05/19/2010   protocol: Persantine, post stress EF 65%, negative for ischemia, low risk scan   RIGHT HEART CATH N/A 06/27/2018   Procedure: RIGHT HEART CATH;  Surgeon: [name], MD;  Location: [facility];  Service: Cardiovascular;  Laterality: N/A;   TOTAL ABDOMINAL HYSTERECTOMY W/ BILATERAL SALPINGOOPHORECTOMY  1989   TRANSTHORACIC ECHOCARDIOGRAM  08/29/2012   Moderately calcified annulus of mitral valve, moderate regurg. of both mitral valve and tricuspid valve.     SOCIAL HISTORY: Social History   Socioeconomic History   Marital status: Widowed    Spouse name: Not on file   Number of children: Not on file   Years of education: Not on file   Highest education level: Not on file  Occupational History   Occupation: Retired  Tobacco Use   Smoking status: Former    Packs/day: 1.00    Years: 30.00    Total pack years: 30.00    Types: Cigarettes   Smokeless tobacco: Never   Tobacco comments:    quit smoking 2005  Vaping Use   Vaping Use: Never used  Substance and Sexual Activity   Alcohol use: No    Alcohol/week: 0.0 standard drinks of alcohol   Drug use: No   Sexual activity: Not Currently    Birth control/protection: None  Other Topics Concern   Not on file  Social History Narrative   Not on file   Social Determinants of Health   Financial Resource Strain: Low Risk  (02/28/2022)   Overall Financial Resource Strain (CARDIA)    Difficulty of Paying Living Expenses: Not hard at all  Food Insecurity: No Food Insecurity (08/31/2022)   Hunger Vital Sign    Worried  About Running Out of Food in the Last Year: Never true    [place] in the Last Year: Never true  Transportation Needs: No Transportation Needs (08/31/2022)   PRAPARE - Hydrologist (Medical): No    Lack of Transportation (Non-Medical): No  Physical Activity: Inactive (02/28/2022)   Exercise Vital Sign    Days of Exercise per Week: 0 days    Minutes of Exercise per Session: 0 min  Stress: No Stress Concern Present (02/28/2022)   [place]    Feeling of Stress : Only a little  Social Connections: Moderately Integrated (06/22/2022)   Social Connection and Isolation Panel [NHANES]    Frequency of Communication with Friends and Family: More than three times a week    Frequency of Social Gatherings with Friends and Family: More than three times a week    Attends Religious Services: More than 4 times per year    Active Member of [employer] or Organizations: Yes    Attends Archivist Meetings: More than 4 times per year    Marital Status: Widowed  Intimate Partner Violence: Not At Risk (08/31/2022)   Humiliation, Afraid, Rape, and Kick questionnaire    Fear of Current or Ex-Partner: No    Emotionally Abused: No    Physically Abused: No    Sexually Abused: No    FAMILY HISTORY: Family History  Problem Relation Age of Onset   Heart disease Father    Hypertension Father    Hyperlipidemia Father    Breast cancer Neg Hx     Review of Systems  Constitutional:  Positive for fatigue. Negative for appetite change, chills, fever and unexpected weight change.  HENT:   Negative for hearing loss, lump/mass, mouth sores, trouble swallowing and voice change.   Eyes:  Negative for eye problems and icterus.  Respiratory:  Negative for chest tightness, cough and shortness of breath.   Cardiovascular:  Negative for chest pain, leg swelling (Leg swelling has improved remarkably) and palpitations.   Gastrointestinal:  Negative for abdominal distention, abdominal pain, blood in stool, constipation, diarrhea, nausea, rectal pain and vomiting.  Endocrine: Negative for hot flashes.  Genitourinary:  Negative for difficulty urinating.   Musculoskeletal:  Negative for arthralgias.  Skin:  Negative for itching and rash.  Neurological:  Negative for dizziness, extremity weakness, headaches and numbness.  Hematological:  Negative for adenopathy. Does not bruise/bleed easily.  Psychiatric/Behavioral:  Negative for depression. The patient is not nervous/anxious.       PHYSICAL EXAMINATION  ECOG PERFORMANCE STATUS: 1 - Symptomatic but completely ambulatory  Vitals:   12/08/22 1253  BP: 131/76  Pulse: 69  Resp: 16  Temp: 97.6 F (36.4 C)  SpO2: 100%    Physical Exam Constitutional:      General: She is not in acute distress.    Appearance: Normal appearance. She is not toxic-appearing.  HENT:     Head: Normocephalic and atraumatic.  Eyes:     General: No scleral icterus. Cardiovascular:     Rate and Rhythm: Normal rate.     Pulses: Normal pulses.     Heart sounds: Murmur (Systolic murmur with a click.) heard.  Pulmonary:     Effort: Pulmonary effort is normal.     Breath sounds: No rhonchi.  Musculoskeletal:        General: No swelling (Bilateral lower extremity edema significantly improved.  No evidence of cellulitis).     Cervical back: Neck supple.     Right lower leg: Edema (significantly better) present.     Left lower leg: Edema (significantly better) present.  Lymphadenopathy:     Cervical: No cervical adenopathy.  Skin:    General: Skin is warm and dry.     Findings: No bruising or rash.  Neurological:     General: No focal deficit present.     Mental Status: She is alert.  Psychiatric:        Mood and Affect: Mood normal.        Behavior: Behavior normal.    LABORATORY DATA:  CBC    Component Value Date/Time   WBC 7.6 12/08/2022 1208   RBC 3.14 (L)  12/08/2022 1208   HGB 9.7 (L) 12/08/2022 1208   HGB 11.9 (L) 11/10/2022 1225   HCT 29.2 (L) 12/08/2022 1208   HCT 28.5 (L) 11/20/2018 1824   PLT 134 (L) 12/08/2022 1208   PLT 153 11/10/2022 1225   MCV 93.0 12/08/2022 1208   MCH 30.9 12/08/2022 1208   MCHC 33.2 12/08/2022 1208   RDW 16.6 (H) 12/08/2022 1208   LYMPHSABS 1.4 12/08/2022 1208   MONOABS 0.8 12/08/2022 1208   EOSABS 0.5 12/08/2022 1208   BASOSABS 0.1 12/08/2022 1208    CMP     Component Value Date/Time   NA 133 (L) 11/22/2022 1100   NA 137 12/06/2017 1436   K 4.0 11/22/2022 1100  CL 98 11/22/2022 1100   CO2 23 11/22/2022 1100   GLUCOSE 230 (H) 11/22/2022 1100   BUN 63 (H) 11/22/2022 1100   BUN 20 12/06/2017 1436   CREATININE 2.25 (H) 11/22/2022 1100   CREATININE 2.31 (H) 11/10/2022 1225   CALCIUM 9.1 11/22/2022 1100   PROT 5.7 (L) 11/22/2022 1100   ALBUMIN 3.3 (L) 11/22/2022 1100   AST 63 (H) 11/22/2022 1100   AST 49 (H) 11/10/2022 1225   ALT 23 11/22/2022 1100   ALT 20 11/10/2022 1225   ALKPHOS 95 11/22/2022 1100   BILITOT 0.7 11/22/2022 1100   BILITOT 0.7 11/10/2022 1225   GFRNONAA 21 (L) 11/22/2022 1100   GFRNONAA 20 (L) 11/10/2022 1225   GFRAA 32 (L) 03/30/2020 0932    ASSESSMENT and THERAPY PLAN:    [name] is a 82 y.o. female who presents for multifactorial anemia, previously treated with rituximab for ANCA positive hemolytic anemia, previously on maintenance rituximab every 12 weeks presented with worsening anemia thought to be related to her hemolysis.  She was diagnosed with elevated ANCA titers 1 is to 640 and previously treated for ANCA positive hemolytic anemia, seen by rheumatology Dr. Salome Holmes back in 2020, responded at that time to prednisone and rituximab and continued on rituximab maintenance.  However her rituximab was changed to every 68-monthinfusions and she relapsed before her second cycle of every 365-monthituximab.    # Multifactorial Anemia:  --Bone marrow biopsy from  02/20/2022 showed normocellular marrow with trilineage hematopoiesis. No leukemia, lymphoma or high-grade dysplasia identified. --CT CAP from 02/20/2022 did not show any underlying cause for anemia.  --ANCA titers are normal when checked on 02/10/2022 -- Currently on EPO every 2 weeks. Ok to proceed with it, Hb under 10. No indication for transfusion  #Bilateral lower extremity edema significantly improved  #Warfarin therapy for [employer] Jude[employer] valve --Under the care of  [facility].  She will be titrated according to the protocol with the patient.  Follow up: --Patient will return in 2 weeks for labs and epo. FU in 4 weeks.  I have spent a total of 30 minutes minutes of face-to-face and non-face-to-face time, preparing to see the patient, performing a medically appropriate examination, counseling and educating the patient, documenting clinical information in the electronic health record,and care coordination.   PrBenay PikeD

## 2022-12-09 DIAGNOSIS — I482 Chronic atrial fibrillation, unspecified: Secondary | ICD-10-CM | POA: Diagnosis not present

## 2022-12-09 DIAGNOSIS — N184 Chronic kidney disease, stage 4 (severe): Secondary | ICD-10-CM | POA: Diagnosis not present

## 2022-12-09 DIAGNOSIS — I130 Hypertensive heart and chronic kidney disease with heart failure and stage 1 through stage 4 chronic kidney disease, or unspecified chronic kidney disease: Secondary | ICD-10-CM | POA: Diagnosis not present

## 2022-12-09 DIAGNOSIS — N179 Acute kidney failure, unspecified: Secondary | ICD-10-CM | POA: Diagnosis not present

## 2022-12-09 DIAGNOSIS — E1151 Type 2 diabetes mellitus with diabetic peripheral angiopathy without gangrene: Secondary | ICD-10-CM | POA: Diagnosis not present

## 2022-12-09 DIAGNOSIS — J189 Pneumonia, unspecified organism: Secondary | ICD-10-CM | POA: Diagnosis not present

## 2022-12-09 DIAGNOSIS — I5033 Acute on chronic diastolic (congestive) heart failure: Secondary | ICD-10-CM | POA: Diagnosis not present

## 2022-12-09 DIAGNOSIS — D631 Anemia in chronic kidney disease: Secondary | ICD-10-CM | POA: Diagnosis not present

## 2022-12-09 DIAGNOSIS — E1122 Type 2 diabetes mellitus with diabetic chronic kidney disease: Secondary | ICD-10-CM | POA: Diagnosis not present

## 2022-12-11 ENCOUNTER — Telehealth: Payer: Self-pay | Admitting: Hematology and Oncology

## 2022-12-11 ENCOUNTER — Encounter: Payer: Self-pay | Admitting: Hematology and Oncology

## 2022-12-11 ENCOUNTER — Other Ambulatory Visit (HOSPITAL_COMMUNITY): Payer: Self-pay | Admitting: Cardiology

## 2022-12-11 DIAGNOSIS — I270 Primary pulmonary hypertension: Secondary | ICD-10-CM

## 2022-12-11 NOTE — Telephone Encounter (Signed)
Spoke with patient confirming upcoming appointment  

## 2022-12-12 DIAGNOSIS — D631 Anemia in chronic kidney disease: Secondary | ICD-10-CM | POA: Diagnosis not present

## 2022-12-12 DIAGNOSIS — I130 Hypertensive heart and chronic kidney disease with heart failure and stage 1 through stage 4 chronic kidney disease, or unspecified chronic kidney disease: Secondary | ICD-10-CM | POA: Diagnosis not present

## 2022-12-12 DIAGNOSIS — I5033 Acute on chronic diastolic (congestive) heart failure: Secondary | ICD-10-CM | POA: Diagnosis not present

## 2022-12-12 DIAGNOSIS — E1122 Type 2 diabetes mellitus with diabetic chronic kidney disease: Secondary | ICD-10-CM | POA: Diagnosis not present

## 2022-12-12 DIAGNOSIS — J189 Pneumonia, unspecified organism: Secondary | ICD-10-CM | POA: Diagnosis not present

## 2022-12-12 DIAGNOSIS — N184 Chronic kidney disease, stage 4 (severe): Secondary | ICD-10-CM | POA: Diagnosis not present

## 2022-12-12 DIAGNOSIS — N179 Acute kidney failure, unspecified: Secondary | ICD-10-CM | POA: Diagnosis not present

## 2022-12-12 DIAGNOSIS — I482 Chronic atrial fibrillation, unspecified: Secondary | ICD-10-CM | POA: Diagnosis not present

## 2022-12-12 DIAGNOSIS — E1151 Type 2 diabetes mellitus with diabetic peripheral angiopathy without gangrene: Secondary | ICD-10-CM | POA: Diagnosis not present

## 2022-12-13 ENCOUNTER — Ambulatory Visit: Payer: Medicare HMO | Admitting: Podiatry

## 2022-12-14 ENCOUNTER — Ambulatory Visit: Payer: Medicare HMO | Attending: Cardiovascular Disease | Admitting: *Deleted

## 2022-12-14 DIAGNOSIS — Z952 Presence of prosthetic heart valve: Secondary | ICD-10-CM

## 2022-12-14 DIAGNOSIS — Z7901 Long term (current) use of anticoagulants: Secondary | ICD-10-CM

## 2022-12-14 LAB — POCT INR: INR: 3.4 — AB (ref 2.0–3.0)

## 2022-12-14 NOTE — Patient Instructions (Signed)
Will finish Macrobid tonight. Continue warfarin 1 tablet daily. Recheck INR in 1 week at [facility] 12/22/22

## 2022-12-15 DIAGNOSIS — N179 Acute kidney failure, unspecified: Secondary | ICD-10-CM | POA: Diagnosis not present

## 2022-12-15 DIAGNOSIS — N184 Chronic kidney disease, stage 4 (severe): Secondary | ICD-10-CM | POA: Diagnosis not present

## 2022-12-15 DIAGNOSIS — J189 Pneumonia, unspecified organism: Secondary | ICD-10-CM | POA: Diagnosis not present

## 2022-12-15 DIAGNOSIS — E1151 Type 2 diabetes mellitus with diabetic peripheral angiopathy without gangrene: Secondary | ICD-10-CM | POA: Diagnosis not present

## 2022-12-15 DIAGNOSIS — I5033 Acute on chronic diastolic (congestive) heart failure: Secondary | ICD-10-CM | POA: Diagnosis not present

## 2022-12-15 DIAGNOSIS — I482 Chronic atrial fibrillation, unspecified: Secondary | ICD-10-CM | POA: Diagnosis not present

## 2022-12-15 DIAGNOSIS — E1122 Type 2 diabetes mellitus with diabetic chronic kidney disease: Secondary | ICD-10-CM | POA: Diagnosis not present

## 2022-12-15 DIAGNOSIS — I130 Hypertensive heart and chronic kidney disease with heart failure and stage 1 through stage 4 chronic kidney disease, or unspecified chronic kidney disease: Secondary | ICD-10-CM | POA: Diagnosis not present

## 2022-12-15 DIAGNOSIS — D631 Anemia in chronic kidney disease: Secondary | ICD-10-CM | POA: Diagnosis not present

## 2022-12-18 DIAGNOSIS — M79601 Pain in right arm: Secondary | ICD-10-CM | POA: Diagnosis not present

## 2022-12-19 ENCOUNTER — Encounter: Payer: Self-pay | Admitting: Vascular Surgery

## 2022-12-19 ENCOUNTER — Ambulatory Visit (HOSPITAL_COMMUNITY)
Admission: RE | Admit: 2022-12-19 | Discharge: 2022-12-19 | Disposition: A | Discharge status: Home / Self Care | Payer: Medicare HMO | Source: Ambulatory Visit | Attending: Vascular Surgery | Admitting: Vascular Surgery

## 2022-12-19 ENCOUNTER — Ambulatory Visit: Payer: Medicare HMO | Admitting: Vascular Surgery

## 2022-12-19 ENCOUNTER — Encounter: Payer: Self-pay | Admitting: Hematology and Oncology

## 2022-12-19 VITALS — BP 148/50 | HR 66 | Temp 97.4°F | Resp 14 | Ht <= 58 in | Wt 114.0 lb

## 2022-12-19 DIAGNOSIS — I130 Hypertensive heart and chronic kidney disease with heart failure and stage 1 through stage 4 chronic kidney disease, or unspecified chronic kidney disease: Secondary | ICD-10-CM | POA: Diagnosis not present

## 2022-12-19 DIAGNOSIS — N184 Chronic kidney disease, stage 4 (severe): Secondary | ICD-10-CM | POA: Diagnosis not present

## 2022-12-19 DIAGNOSIS — I6523 Occlusion and stenosis of bilateral carotid arteries: Secondary | ICD-10-CM

## 2022-12-19 DIAGNOSIS — N179 Acute kidney failure, unspecified: Secondary | ICD-10-CM | POA: Diagnosis not present

## 2022-12-19 DIAGNOSIS — E1122 Type 2 diabetes mellitus with diabetic chronic kidney disease: Secondary | ICD-10-CM | POA: Diagnosis not present

## 2022-12-19 DIAGNOSIS — I5033 Acute on chronic diastolic (congestive) heart failure: Secondary | ICD-10-CM | POA: Diagnosis not present

## 2022-12-19 DIAGNOSIS — D631 Anemia in chronic kidney disease: Secondary | ICD-10-CM | POA: Diagnosis not present

## 2022-12-19 DIAGNOSIS — I482 Chronic atrial fibrillation, unspecified: Secondary | ICD-10-CM | POA: Diagnosis not present

## 2022-12-19 DIAGNOSIS — E1151 Type 2 diabetes mellitus with diabetic peripheral angiopathy without gangrene: Secondary | ICD-10-CM | POA: Diagnosis not present

## 2022-12-19 DIAGNOSIS — J189 Pneumonia, unspecified organism: Secondary | ICD-10-CM | POA: Diagnosis not present

## 2022-12-19 NOTE — Progress Notes (Signed)
Patient name: [name] MRN: [id number] DOB: November 11, 1940 Sex: female  REASON FOR CONSULT: 6 month follow-up for high-grade asymptomatic left carotid stenosis  HPI: [name] is a 82 y.o. female, with history of hypertension, hyperlipidemia, diastolic heart failure, moderate to severe pulmonary hypertension, A. Fib on coumadin, chronic kidney disease, CAD s/p CABG with mechanical AVR, peripheral arterial disease that presents for 6 month follow-up of her carotid disease.  She has been followed by Dr. [name] who has performed lower extremity interventions for her PAD.  She had previously been noted to have progression of her left ICA stenosis to 80-99% by duplex on 05/20/21.  Given her CKD we sent her for CT of the neck w/o contrast to evaluate for possible carotid stenting with TCAR. She was not a candidate for TCAR given severely diseased common carotid artery.  Ultimately we decided on medical management.  Today she reports no neurologic symptoms over the last 6 months.  She has lost significant weight and has now moved in with her daughter due to ongoing frailty.  Has a hard time taking care of herself now.  Sitting in a wheelchair today.  Past Medical History:  Diagnosis Date   Aortic atherosclerosis ([place]) 01/23/2018   Atrial fibrillation ([facility])    Benign positional vertigo 10/06/2011   Carotid artery disease ([place])    Chemotherapy induced neutropenia ([place]) 07/21/2019   Cholelithiasis 01/23/2018   Cholelithiasis 01/23/2018   Chronic anticoagulation    Colovesical fistula, ruled out 04/14/2022   Coronary artery disease    status post coronary artery bypass grafting times 07/10/2004   Diabetes mellitus    Hypercholesteremia    Hypertension    Mechanical heart valve present    H. aortic valve replacement at the time of bypass surgery October 2005   Moderate to severe pulmonary hypertension ([place])    Peripheral arterial disease ([place])    history of left common iliac artery PTA and  stenting for a chronic total occlusion 08/26/01   S/P cholecystectomy 03/06/2018    Past Surgical History:  Procedure Laterality Date   anterior repair  2009   AORTIC VALVE REPLACEMENT  2005   Dr. Cyndia Bent   CARDIAC CATHETERIZATION  11/10/2004   40% right common illiac, 70% in stent restenosis of distal left common illiac,    CARDIAC CATHETERIZATION  05/18/2004   LAD 50-70% midstenosis, RCA dominant w/50% stenosis, 50% Right common Illiac artery ostial stenosis, 90% in stent restenosis within midportion of left common illiac stent   Carotid Duplex  03/12/2012   RSA-elev. velocities suggestive of a 50-69% diameter reduction, Right&Left Bulb/Prox ICA-mild-mod.fibrous plaqueelevating Velocities abnormal study.   CHOLECYSTECTOMY N/A 03/01/2018   Procedure: LAPAROSCOPIC CHOLECYSTECTOMY;  Surgeon: Judeth Horn, MD;  Location: [facility];  Service: General;  Laterality: N/A;   COLONOSCOPY WITH PROPOFOL N/A 01/22/2018   Procedure: COLONOSCOPY WITH PROPOFOL;  Surgeon: Wilford Corner, MD;  Location: [facility];  Service: Endoscopy;  Laterality: N/A;   ESOPHAGOGASTRODUODENOSCOPY N/A 07/13/2022   Procedure: ESOPHAGOGASTRODUODENOSCOPY (EGD);  Surgeon: [name], MD;  Location: [facility];  Service: Gastroenterology;  Laterality: N/A;   ESOPHAGOGASTRODUODENOSCOPY (EGD) WITH PROPOFOL N/A 01/22/2018   Procedure: ESOPHAGOGASTRODUODENOSCOPY (EGD) WITH PROPOFOL;  Surgeon: Wilford Corner, MD;  Location: [facility];  Service: Endoscopy;  Laterality: N/A;   ESOPHAGOGASTRODUODENOSCOPY (EGD) WITH PROPOFOL N/A 11/21/2018   Procedure: ESOPHAGOGASTRODUODENOSCOPY (EGD) WITH PROPOFOL;  Surgeon: [name], MD;  Location: [facility];  Service: Gastroenterology;  Laterality: N/A;   ESOPHAGOGASTRODUODENOSCOPY (EGD) WITH PROPOFOL Left 02/27/2019  Procedure: ESOPHAGOGASTRODUODENOSCOPY (EGD) WITH PROPOFOL;  Surgeon: Arta Silence, MD;  Location: [facility];  Service: Endoscopy;  Laterality: Left;   GIVENS CAPSULE  STUDY N/A 11/21/2018   Procedure: GIVENS CAPSULE STUDY;  Surgeon: [name], MD;  Location: [facility];  Service: Gastroenterology;  Laterality: N/A;  To be deployed during EGD   Lower Ext. Duplex  03/12/2012   Right Proximal CIA- vessel narrowing w/elevated velocities 0-49% diameter reduction. Right SFA-mild mixed density plaque throughout vessel.   NM MYOCAR PERF WALL MOTION  05/19/2010   protocol: Persantine, post stress EF 65%, negative for ischemia, low risk scan   RIGHT HEART CATH N/A 06/27/2018   Procedure: RIGHT HEART CATH;  Surgeon: [name], MD;  Location: [facility];  Service: Cardiovascular;  Laterality: N/A;   TOTAL ABDOMINAL HYSTERECTOMY W/ BILATERAL SALPINGOOPHORECTOMY  1989   TRANSTHORACIC ECHOCARDIOGRAM  08/29/2012   Moderately calcified annulus of mitral valve, moderate regurg. of both mitral valve and tricuspid valve.     Family History  Problem Relation Age of Onset   Heart disease Father    Hypertension Father    Hyperlipidemia Father    Breast cancer Neg Hx     SOCIAL HISTORY: Social History   Socioeconomic History   Marital status: Widowed    Spouse name: Not on file   Number of children: Not on file   Years of education: Not on file   Highest education level: Not on file  Occupational History   Occupation: Retired  Tobacco Use   Smoking status: Former    Packs/day: 1.00    Years: 30.00    Total pack years: 30.00    Types: Cigarettes   Smokeless tobacco: Never   Tobacco comments:    quit smoking 2005  Vaping Use   Vaping Use: Never used  Substance and Sexual Activity   Alcohol use: No    Alcohol/week: 0.0 standard drinks of alcohol   Drug use: No   Sexual activity: Not Currently    Birth control/protection: None  Other Topics Concern   Not on file  Social History Narrative   Not on file   Social Determinants of Health   Financial Resource Strain: Low Risk  (02/28/2022)   Overall Financial Resource Strain (CARDIA)     Difficulty of Paying Living Expenses: Not hard at all  Food Insecurity: No Food Insecurity (08/31/2022)   Hunger Vital Sign    Worried About Running Out of Food in the Last Year: Never true    [place] in the Last Year: Never true  Transportation Needs: No Transportation Needs (08/31/2022)   PRAPARE - Hydrologist (Medical): No    Lack of Transportation (Non-Medical): No  Physical Activity: Inactive (02/28/2022)   Exercise Vital Sign    Days of Exercise per Week: 0 days    Minutes of Exercise per Session: 0 min  Stress: No Stress Concern Present (02/28/2022)   [place]    Feeling of Stress : Only a little  Social Connections: Moderately Integrated (06/22/2022)   Social Connection and Isolation Panel [NHANES]    Frequency of Communication with Friends and Family: More than three times a week    Frequency of Social Gatherings with Friends and Family: More than three times a week    Attends Religious Services: More than 4 times per year    Active Member of Clubs or Organizations: Yes    Attends  Club or Organization Meetings: More than 4 times per year    Marital Status: Widowed  Intimate Partner Violence: Not At Risk (08/31/2022)   Humiliation, Afraid, Rape, and Kick questionnaire    Fear of Current or Ex-Partner: No    Emotionally Abused: No    Physically Abused: No    Sexually Abused: No    Allergies  Allergen Reactions   Flagyl [Metronidazole] Rash and Other (See Comments)    ALL-OVER BODY RASH   Augmentin [Amoxicillin-Pot Clavulanate] Diarrhea and Nausea And Vomiting   Ciprofloxacin Diarrhea, Nausea Only and Other (See Comments)    Stomach ache   Codeine     Insomnia, anxiety   Coreg [Carvedilol] Other (See Comments)    Terrible cramping in the feet and had a lot of bowel movements, but not diarrhea   Diflucan [Fluconazole] Diarrhea   Levofloxacin In D5w     Vomiting,  diarrhea, insomnia   Losartan Swelling and Other (See Comments)    Patient doesn't recall site of swelling   Privigen [Immune Globulin (Human)] Other (See Comments)    Severe/excruciating pain   Sulfamethoxazole-Trimethoprim Diarrhea, Nausea Only and Other (See Comments)    Stomach upset   Verapamil Hives   Zetia [Ezetimibe] Other (See Comments)    Reaction not recalled   Zocor [Simvastatin - High Dose] Other (See Comments)    Reaction not recalled   Gatifloxacin Rash and Other (See Comments)    Redness to skin around eye    Current Outpatient Medications  Medication Sig Dispense Refill   acetaminophen (TYLENOL) 325 MG tablet Take 325 mg by mouth every 6 (six) hours as needed for mild pain or fever.     allopurinol (ZYLOPRIM) 100 MG tablet Take 100 mg by mouth daily.     ALPRAZolam (XANAX) 0.25 MG tablet Take 1 tablet (0.25 mg total) by mouth 2 (two) times daily as needed for anxiety or sleep. 10 tablet 0   amLODipine (NORVASC) 10 MG tablet TAKE 1 TABLET EVERY DAY 90 tablet 3   aspirin 81 MG chewable tablet 1 tablet Orally Once a day     atorvastatin (LIPITOR) 80 MG tablet Take 1 tablet (80 mg total) by mouth daily at 6 PM.     Cholecalciferol (VITAMIN D3) 50 MCG (2000 UT) capsule Take 2,000 Units by mouth daily with lunch.     Cyanocobalamin (VITAMIN B12) 1000 MCG TBCR Take 1,000 mcg by mouth daily with lunch.     famotidine (PEPCID) 20 MG tablet Take 20 mg by mouth 2 (two) times daily.     feeding supplement, GLUCERNA SHAKE, (GLUCERNA SHAKE) LIQD Take 237 mLs by mouth 3 (three) times daily between meals.  0   ferrous sulfate 325 (65 FE) MG tablet Take 1 tablet (325 mg total) by mouth daily with breakfast.  3   furosemide (LASIX) 40 MG tablet TAKE 2 TABLETS IN MORNING AND 1 AND 1/2 TABLETS IN EVENING. CHANGE IN DOSAGE. 315 tablet 3   hydrALAZINE (APRESOLINE) 100 MG tablet TAKE 1 TABLET THREE TIMES DAILY 270 tablet 3   insulin aspart (NOVOLOG) 100 UNIT/ML injection Inject 2-8 Units  into the skin 3 (three) times daily with meals. Per sliding scale, If blood sugar is 200 to 250, give 2 units. If blood sugar is 251 to 300, give 4 units. If blood sugar is 301 to 350, give 6 units. If blood sugar is greater than 350, give 8 units and call MD.     Insulin Syringe-Needle U-100 (B-D INS  SYR ULTRAFINE .3CC/29G) 29G X 1/2" 0.3 ML MISC Use for sliding scale for breakthrough high blood sugar readings 100 each 1   metoprolol succinate (TOPROL-XL) 25 MG 24 hr tablet TAKE 1/2 TABLET EVERY DAY 45 tablet 3   ondansetron (ZOFRAN-ODT) 4 MG disintegrating tablet Take 4 mg by mouth every 8 (eight) hours as needed for nausea or vomiting.     pantoprazole (PROTONIX) 40 MG tablet Take 40 mg by mouth 2 (two) times daily.     Sertraline HCl (ZOLOFT PO) Take 1 tablet by mouth daily.     simethicone (GAS-X) 80 MG chewable tablet Chew 1 tablet (80 mg total) by mouth 4 (four) times daily as needed for flatulence. 100 tablet 2   tadalafil, PAH, (ADCIRCA) 20 MG tablet TAKE 2 TABLETS BY MOUTH ONCE DAILY 60 tablet 11   warfarin (COUMADIN) 5 MG tablet Take 1 tablet (5 mg total) by mouth See admin instructions. Take 1 tablet ever day except for on Sundays 1 and 1/2 tablets     No current facility-administered medications for this visit.    REVIEW OF SYSTEMS:  '[X]'$  denotes positive finding, '[ ]'$  denotes negative finding Cardiac  Comments:  Chest pain or chest pressure:    Shortness of breath upon exertion:    Short of breath when lying flat:    Irregular heart rhythm:        Vascular    Pain in calf, thigh, or hip brought on by ambulation:    Pain in feet at night that wakes you up from your sleep:     Blood clot in your veins:    Leg swelling:         Pulmonary    Oxygen at home:    Productive cough:     Wheezing:         Neurologic    Sudden weakness in arms or legs:     Sudden numbness in arms or legs:     Sudden onset of difficulty speaking or slurred speech:    Temporary loss of vision  in one eye:     Problems with dizziness:         Gastrointestinal    Blood in stool:     Vomited blood:         Genitourinary    Burning when urinating:     Blood in urine:        Psychiatric    Major depression:         Hematologic    Bleeding problems:    Problems with blood clotting too easily:        Skin    Rashes or ulcers:        Constitutional    Fever or chills:      PHYSICAL EXAM: Vitals:   12/19/22 1141  BP: (!) 148/50  Pulse: 66  Resp: 14  Temp: (!) 97.4 F (36.3 C)  TempSrc: Temporal  SpO2: 95%  Weight: 114 lb (51.7 kg)  Height: '4\' 10"'$  (1.473 m)    GENERAL: The patient is a well-nourished female, in no acute distress. The vital signs are documented above. CARDIAC: Irregular rhythm.  PULMONARY: No respiratory distress ABDOMEN: Soft and non-tender. MUSCULOSKELETAL: There are no major deformities or cyanosis. NEUROLOGIC: No focal weakness or paresthesias are detected.  Cranial nerves II through XII grossly intact SKIN: There are no ulcers or rashes noted. PSYCHIATRIC: The patient has a normal affect.  DATA:   Carotid duplex today shows a 40  to 59% right ICA stenosis and a 60-79% left ICA stenosis.   Previous CT neck without contrast showed significant calcification throughout the left common carotid artery into the bifurcation with significant circumferential calcification in the proximal ICA.  Assessment/Plan:  82 year old female with multiple medical comorbidities presents for interval 6 month follow-up of asymptomatic carotid disease.  Carotid duplex in the past has demonstrated high-grade stenosis but on the last visit she had a moderate 60 to 79% stenosis.  I suspect due to severe calcification this is underestimated and its likely high-grade.  I initially thought her best option was likely TCAR given her comorbidities and the need to get her back on anticoagulation immediately for her mechanical aortic valve.  Unfortunately after review of her  CT neck in the past  she had significant calcification in the common carotid and at the bifurcation and she was not a good candidate for TCAR.  Previously discussed that in asymptomatic carotid trials stroke risk reduction was from 11% with medical management to around 5% over 5 years with modest risk reduction.    Again discussed today that in the setting of asymptomatic disease with her comorbid risk it would be very reasonable to continue medical management.  I think her perioperative complication risk remains very high.  She also now has increasing frailty with poor independent living and has moved in with her daughter.  I had a long discussion with her and her son today about medical management as her best option as I think she is a poor surgical candidate.  We will see her in 1 year.   Marty Heck, MD [facility] Office: [phone]

## 2022-12-20 ENCOUNTER — Ambulatory Visit: Payer: Medicare HMO | Admitting: Podiatry

## 2022-12-21 ENCOUNTER — Ambulatory Visit: Payer: Medicare HMO | Admitting: Podiatry

## 2022-12-21 ENCOUNTER — Encounter: Payer: Self-pay | Admitting: Podiatry

## 2022-12-21 DIAGNOSIS — B351 Tinea unguium: Secondary | ICD-10-CM

## 2022-12-21 DIAGNOSIS — E1151 Type 2 diabetes mellitus with diabetic peripheral angiopathy without gangrene: Secondary | ICD-10-CM

## 2022-12-21 DIAGNOSIS — M79675 Pain in left toe(s): Secondary | ICD-10-CM | POA: Diagnosis not present

## 2022-12-21 DIAGNOSIS — M79674 Pain in right toe(s): Secondary | ICD-10-CM | POA: Diagnosis not present

## 2022-12-22 ENCOUNTER — Ambulatory Visit (INDEPENDENT_AMBULATORY_CARE_PROVIDER_SITE_OTHER): Payer: Medicare HMO | Admitting: Cardiovascular Disease

## 2022-12-22 ENCOUNTER — Other Ambulatory Visit: Payer: Self-pay

## 2022-12-22 ENCOUNTER — Telehealth: Payer: Self-pay | Admitting: *Deleted

## 2022-12-22 ENCOUNTER — Inpatient Hospital Stay: Payer: Medicare HMO

## 2022-12-22 ENCOUNTER — Telehealth: Payer: Self-pay | Admitting: Cardiovascular Disease

## 2022-12-22 VITALS — BP 133/64 | HR 79 | Temp 97.7°F | Resp 17

## 2022-12-22 DIAGNOSIS — F32A Depression, unspecified: Secondary | ICD-10-CM | POA: Diagnosis present

## 2022-12-22 DIAGNOSIS — E43 Unspecified severe protein-calorie malnutrition: Secondary | ICD-10-CM | POA: Diagnosis not present

## 2022-12-22 DIAGNOSIS — K5900 Constipation, unspecified: Secondary | ICD-10-CM | POA: Diagnosis present

## 2022-12-22 DIAGNOSIS — I272 Pulmonary hypertension, unspecified: Secondary | ICD-10-CM | POA: Diagnosis present

## 2022-12-22 DIAGNOSIS — R6520 Severe sepsis without septic shock: Secondary | ICD-10-CM | POA: Diagnosis present

## 2022-12-22 DIAGNOSIS — K2970 Gastritis, unspecified, without bleeding: Secondary | ICD-10-CM | POA: Diagnosis not present

## 2022-12-22 DIAGNOSIS — D638 Anemia in other chronic diseases classified elsewhere: Secondary | ICD-10-CM

## 2022-12-22 DIAGNOSIS — Z87891 Personal history of nicotine dependence: Secondary | ICD-10-CM

## 2022-12-22 DIAGNOSIS — N179 Acute kidney failure, unspecified: Secondary | ICD-10-CM | POA: Diagnosis not present

## 2022-12-22 DIAGNOSIS — D649 Anemia, unspecified: Secondary | ICD-10-CM | POA: Diagnosis not present

## 2022-12-22 DIAGNOSIS — R54 Age-related physical debility: Secondary | ICD-10-CM | POA: Diagnosis present

## 2022-12-22 DIAGNOSIS — I6523 Occlusion and stenosis of bilateral carotid arteries: Secondary | ICD-10-CM | POA: Diagnosis not present

## 2022-12-22 DIAGNOSIS — I482 Chronic atrial fibrillation, unspecified: Secondary | ICD-10-CM | POA: Diagnosis present

## 2022-12-22 DIAGNOSIS — Z515 Encounter for palliative care: Secondary | ICD-10-CM | POA: Diagnosis not present

## 2022-12-22 DIAGNOSIS — N181 Chronic kidney disease, stage 1: Secondary | ICD-10-CM | POA: Diagnosis not present

## 2022-12-22 DIAGNOSIS — E1122 Type 2 diabetes mellitus with diabetic chronic kidney disease: Secondary | ICD-10-CM | POA: Diagnosis present

## 2022-12-22 DIAGNOSIS — I959 Hypotension, unspecified: Secondary | ICD-10-CM | POA: Diagnosis not present

## 2022-12-22 DIAGNOSIS — K921 Melena: Secondary | ICD-10-CM | POA: Diagnosis present

## 2022-12-22 DIAGNOSIS — Z8249 Family history of ischemic heart disease and other diseases of the circulatory system: Secondary | ICD-10-CM

## 2022-12-22 DIAGNOSIS — A419 Sepsis, unspecified organism: Secondary | ICD-10-CM | POA: Diagnosis not present

## 2022-12-22 DIAGNOSIS — I110 Hypertensive heart disease with heart failure: Secondary | ICD-10-CM | POA: Diagnosis not present

## 2022-12-22 DIAGNOSIS — I5032 Chronic diastolic (congestive) heart failure: Secondary | ICD-10-CM | POA: Diagnosis present

## 2022-12-22 DIAGNOSIS — Z6823 Body mass index (BMI) 23.0-23.9, adult: Secondary | ICD-10-CM

## 2022-12-22 DIAGNOSIS — N184 Chronic kidney disease, stage 4 (severe): Secondary | ICD-10-CM | POA: Diagnosis present

## 2022-12-22 DIAGNOSIS — D509 Iron deficiency anemia, unspecified: Secondary | ICD-10-CM | POA: Diagnosis present

## 2022-12-22 DIAGNOSIS — Z885 Allergy status to narcotic agent status: Secondary | ICD-10-CM

## 2022-12-22 DIAGNOSIS — Z888 Allergy status to other drugs, medicaments and biological substances status: Secondary | ICD-10-CM

## 2022-12-22 DIAGNOSIS — Z66 Do not resuscitate: Secondary | ICD-10-CM | POA: Diagnosis not present

## 2022-12-22 DIAGNOSIS — I130 Hypertensive heart and chronic kidney disease with heart failure and stage 1 through stage 4 chronic kidney disease, or unspecified chronic kidney disease: Secondary | ICD-10-CM | POA: Diagnosis present

## 2022-12-22 DIAGNOSIS — E8809 Other disorders of plasma-protein metabolism, not elsewhere classified: Secondary | ICD-10-CM | POA: Diagnosis present

## 2022-12-22 DIAGNOSIS — E1151 Type 2 diabetes mellitus with diabetic peripheral angiopathy without gangrene: Secondary | ICD-10-CM | POA: Diagnosis present

## 2022-12-22 DIAGNOSIS — E1165 Type 2 diabetes mellitus with hyperglycemia: Secondary | ICD-10-CM | POA: Diagnosis present

## 2022-12-22 DIAGNOSIS — I5033 Acute on chronic diastolic (congestive) heart failure: Secondary | ICD-10-CM | POA: Diagnosis not present

## 2022-12-22 DIAGNOSIS — J189 Pneumonia, unspecified organism: Secondary | ICD-10-CM | POA: Diagnosis not present

## 2022-12-22 DIAGNOSIS — Z882 Allergy status to sulfonamides status: Secondary | ICD-10-CM

## 2022-12-22 DIAGNOSIS — Z952 Presence of prosthetic heart valve: Secondary | ICD-10-CM | POA: Diagnosis not present

## 2022-12-22 DIAGNOSIS — D631 Anemia in chronic kidney disease: Secondary | ICD-10-CM | POA: Diagnosis not present

## 2022-12-22 DIAGNOSIS — K529 Noninfective gastroenteritis and colitis, unspecified: Secondary | ICD-10-CM | POA: Diagnosis not present

## 2022-12-22 DIAGNOSIS — D689 Coagulation defect, unspecified: Secondary | ICD-10-CM | POA: Diagnosis not present

## 2022-12-22 DIAGNOSIS — I480 Paroxysmal atrial fibrillation: Secondary | ICD-10-CM | POA: Diagnosis present

## 2022-12-22 DIAGNOSIS — E11319 Type 2 diabetes mellitus with unspecified diabetic retinopathy without macular edema: Secondary | ICD-10-CM | POA: Diagnosis present

## 2022-12-22 DIAGNOSIS — I2510 Atherosclerotic heart disease of native coronary artery without angina pectoris: Secondary | ICD-10-CM | POA: Diagnosis present

## 2022-12-22 DIAGNOSIS — I509 Heart failure, unspecified: Secondary | ICD-10-CM | POA: Diagnosis not present

## 2022-12-22 DIAGNOSIS — Z1152 Encounter for screening for COVID-19: Secondary | ICD-10-CM | POA: Diagnosis not present

## 2022-12-22 DIAGNOSIS — Z5181 Encounter for therapeutic drug level monitoring: Secondary | ICD-10-CM | POA: Diagnosis not present

## 2022-12-22 DIAGNOSIS — I700 Atherosclerosis of aorta: Secondary | ICD-10-CM | POA: Diagnosis present

## 2022-12-22 DIAGNOSIS — D500 Iron deficiency anemia secondary to blood loss (chronic): Secondary | ICD-10-CM

## 2022-12-22 DIAGNOSIS — K2980 Duodenitis without bleeding: Secondary | ICD-10-CM | POA: Diagnosis present

## 2022-12-22 DIAGNOSIS — E7800 Pure hypercholesterolemia, unspecified: Secondary | ICD-10-CM | POA: Diagnosis present

## 2022-12-22 DIAGNOSIS — Z7189 Other specified counseling: Secondary | ICD-10-CM | POA: Diagnosis not present

## 2022-12-22 DIAGNOSIS — K6389 Other specified diseases of intestine: Secondary | ICD-10-CM | POA: Diagnosis not present

## 2022-12-22 DIAGNOSIS — Z794 Long term (current) use of insulin: Secondary | ICD-10-CM | POA: Diagnosis not present

## 2022-12-22 DIAGNOSIS — I160 Hypertensive urgency: Secondary | ICD-10-CM | POA: Diagnosis present

## 2022-12-22 DIAGNOSIS — R109 Unspecified abdominal pain: Secondary | ICD-10-CM | POA: Diagnosis not present

## 2022-12-22 DIAGNOSIS — K922 Gastrointestinal hemorrhage, unspecified: Secondary | ICD-10-CM | POA: Diagnosis not present

## 2022-12-22 DIAGNOSIS — D591 Autoimmune hemolytic anemia, unspecified: Secondary | ICD-10-CM

## 2022-12-22 DIAGNOSIS — Z7901 Long term (current) use of anticoagulants: Secondary | ICD-10-CM

## 2022-12-22 DIAGNOSIS — R112 Nausea with vomiting, unspecified: Secondary | ICD-10-CM | POA: Diagnosis not present

## 2022-12-22 DIAGNOSIS — R0689 Other abnormalities of breathing: Secondary | ICD-10-CM | POA: Diagnosis not present

## 2022-12-22 DIAGNOSIS — Z951 Presence of aortocoronary bypass graft: Secondary | ICD-10-CM

## 2022-12-22 DIAGNOSIS — R339 Retention of urine, unspecified: Secondary | ICD-10-CM | POA: Diagnosis present

## 2022-12-22 DIAGNOSIS — Z7982 Long term (current) use of aspirin: Secondary | ICD-10-CM

## 2022-12-22 DIAGNOSIS — Z79899 Other long term (current) drug therapy: Secondary | ICD-10-CM

## 2022-12-22 DIAGNOSIS — I7782 Antineutrophilic cytoplasmic antibody (ANCA) vasculitis: Secondary | ICD-10-CM | POA: Diagnosis not present

## 2022-12-22 DIAGNOSIS — I4891 Unspecified atrial fibrillation: Secondary | ICD-10-CM | POA: Diagnosis not present

## 2022-12-22 DIAGNOSIS — K219 Gastro-esophageal reflux disease without esophagitis: Secondary | ICD-10-CM | POA: Diagnosis present

## 2022-12-22 DIAGNOSIS — D594 Other nonautoimmune hemolytic anemias: Secondary | ICD-10-CM

## 2022-12-22 DIAGNOSIS — F419 Anxiety disorder, unspecified: Secondary | ICD-10-CM | POA: Diagnosis present

## 2022-12-22 DIAGNOSIS — R1084 Generalized abdominal pain: Secondary | ICD-10-CM | POA: Diagnosis not present

## 2022-12-22 DIAGNOSIS — S5001XD Contusion of right elbow, subsequent encounter: Secondary | ICD-10-CM | POA: Diagnosis not present

## 2022-12-22 DIAGNOSIS — I739 Peripheral vascular disease, unspecified: Secondary | ICD-10-CM | POA: Diagnosis not present

## 2022-12-22 DIAGNOSIS — Z83438 Family history of other disorder of lipoprotein metabolism and other lipidemia: Secondary | ICD-10-CM

## 2022-12-22 LAB — CBC WITH DIFFERENTIAL/PLATELET
Abs Immature Granulocytes: 0.02 10*3/uL (ref 0.00–0.07)
Basophils Absolute: 0.1 10*3/uL (ref 0.0–0.1)
Basophils Relative: 1 %
Eosinophils Absolute: 0.4 10*3/uL (ref 0.0–0.5)
Eosinophils Relative: 5 %
HCT: 30.4 % — ABNORMAL LOW (ref 36.0–46.0)
Hemoglobin: 9.8 g/dL — ABNORMAL LOW (ref 12.0–15.0)
Immature Granulocytes: 0 %
Lymphocytes Relative: 22 %
Lymphs Abs: 1.6 10*3/uL (ref 0.7–4.0)
MCH: 31.3 pg (ref 26.0–34.0)
MCHC: 32.2 g/dL (ref 30.0–36.0)
MCV: 97.1 fL (ref 80.0–100.0)
Monocytes Absolute: 0.7 10*3/uL (ref 0.1–1.0)
Monocytes Relative: 9 %
Neutro Abs: 4.5 10*3/uL (ref 1.7–7.7)
Neutrophils Relative %: 63 %
Platelets: 150 10*3/uL (ref 150–400)
RBC: 3.13 MIL/uL — ABNORMAL LOW (ref 3.87–5.11)
RDW: 16.8 % — ABNORMAL HIGH (ref 11.5–15.5)
WBC: 7.2 10*3/uL (ref 4.0–10.5)
nRBC: 0 % (ref 0.0–0.2)

## 2022-12-22 LAB — COMPREHENSIVE METABOLIC PANEL
ALT: 15 U/L (ref 0–44)
AST: 28 U/L (ref 15–41)
Albumin: 4.1 g/dL (ref 3.5–5.0)
Alkaline Phosphatase: 152 U/L — ABNORMAL HIGH (ref 38–126)
Anion gap: 11 (ref 5–15)
BUN: 89 mg/dL — ABNORMAL HIGH (ref 8–23)
CO2: 24 mmol/L (ref 22–32)
Calcium: 9.7 mg/dL (ref 8.9–10.3)
Chloride: 100 mmol/L (ref 98–111)
Creatinine, Ser: 2.6 mg/dL — ABNORMAL HIGH (ref 0.44–1.00)
GFR, Estimated: 18 mL/min — ABNORMAL LOW (ref >60)
Glucose, Bld: 171 mg/dL — ABNORMAL HIGH (ref 70–99)
Potassium: 4.1 mmol/L (ref 3.5–5.1)
Sodium: 135 mmol/L (ref 135–145)
Total Bilirubin: 0.9 mg/dL (ref 0.3–1.2)
Total Protein: 6.5 g/dL (ref 6.5–8.1)

## 2022-12-22 LAB — SAMPLE TO BLOOD BANK

## 2022-12-22 LAB — PROTIME-INR
INR: 4.4 (ref 0.8–1.2)
Prothrombin Time: 41.8 seconds — ABNORMAL HIGH (ref 11.4–15.2)

## 2022-12-22 MED ORDER — EPOETIN ALFA-EPBX 40000 UNIT/ML IJ SOLN
40000.0000 [IU] | Freq: Once | INTRAMUSCULAR | Status: AC
  Administered 2022-12-22: 40000 [IU] via SUBCUTANEOUS
  Filled 2022-12-22: qty 1

## 2022-12-22 NOTE — Telephone Encounter (Signed)
Per INR of 4.4 called to this RN at 305 pm per lab drawn today MD in this office recommended for patient to hold coumadin for 2 days and then take 1/2 dose on Sunday and then on Monday to follow up with the cardiologist who is managing her coumadin.  Per call to Sherwanda (she had left the office) - she states Dr [name] is managing her coumadin.  She states she took her last dose pm yesterday 3/14.  She is currently in the car on her way home and does not her coumadin bottle with her.  Discussed current INR reading and Dr Rob Hickman recommendation at present with Earlie Server verbaling back to this RN instructions.  This RN informed her above information will be forwarded to Dr [name] and she is to follow up with him on Monday or sooner if his office call her.  Discussed with her due to lateness of day and being Friday this office is giving recommendation for best outcome until patient can follow up with Dr [name].  This note will be forwarded to Dr [name] for further recommendations.

## 2022-12-22 NOTE — Telephone Encounter (Signed)
Please see anti-caog encounter from today (3/15) for further documentation.

## 2022-12-22 NOTE — Progress Notes (Unsigned)
INR IS 4.4 CALLED TO [name] @ 1501 12/22/2022 [name], J.

## 2022-12-22 NOTE — Progress Notes (Signed)
Subjective:   Patient ID: [name], female   DOB: 82 y.o.   MRN: [id number]   HPI Patient presents with elongated nailbeds 1-5 both feet that are thick and hard for her to cut and does have long-term diabetes under reasonably good control   ROS      Objective:  Physical Exam  Neurovascular status intact thick yellow brittle nailbeds 1-5 both feet moderately tender     Assessment:  Mycotic nail infection with pain 1-5 both feet with diabetes     Plan:  Debridement nailbeds 1-5 both feet no angiogenic bleeding reappoint routine care

## 2022-12-22 NOTE — Patient Instructions (Addendum)
Description   Called and spoke to pt's daughter and son and instructed pt to hold warfarin tonight and then go back to taking warfarin 1 tablet daily. Recheck INR in 1 week.

## 2022-12-22 NOTE — Telephone Encounter (Signed)
Pt c/o medication issue:  1. Name of Medication: warfarin (COUMADIN) 5 MG tablet  2. How are you currently taking this medication (dosage and times per day)?   Take 1 tablet (5 mg total) by mouth See admin instructions. Take 1 tablet ever day except for on Sundays 1 and 1/2 tablets    3. Are you having a reaction (difficulty breathing--STAT)? No  4. What is your medication issue? RN Mateo Flow stated that the pt was instructed to stop taking his medication for two days and to have an appt with Dr. [name] to f/u. RN Mateo Flow would like a callback to go into detail on why the pt was instructed to stop taking this medication for the next two days. Please call her back at [phone]

## 2022-12-22 NOTE — Patient Instructions (Signed)

## 2022-12-24 ENCOUNTER — Other Ambulatory Visit: Payer: Self-pay

## 2022-12-24 ENCOUNTER — Emergency Department (HOSPITAL_COMMUNITY): Payer: Medicare HMO

## 2022-12-24 ENCOUNTER — Encounter (HOSPITAL_COMMUNITY): Payer: Self-pay | Admitting: Emergency Medicine

## 2022-12-24 ENCOUNTER — Inpatient Hospital Stay (HOSPITAL_COMMUNITY)
Admission: EM | Admit: 2022-12-24 | Discharge: 2022-12-31 | DRG: 871 | Disposition: A | Discharge status: Home Health | Payer: Medicare HMO | Attending: Family Medicine | Admitting: Family Medicine

## 2022-12-24 DIAGNOSIS — E1151 Type 2 diabetes mellitus with diabetic peripheral angiopathy without gangrene: Secondary | ICD-10-CM | POA: Diagnosis present

## 2022-12-24 DIAGNOSIS — K6389 Other specified diseases of intestine: Secondary | ICD-10-CM | POA: Diagnosis not present

## 2022-12-24 DIAGNOSIS — K921 Melena: Secondary | ICD-10-CM | POA: Diagnosis present

## 2022-12-24 DIAGNOSIS — I739 Peripheral vascular disease, unspecified: Secondary | ICD-10-CM | POA: Diagnosis not present

## 2022-12-24 DIAGNOSIS — K922 Gastrointestinal hemorrhage, unspecified: Secondary | ICD-10-CM | POA: Diagnosis present

## 2022-12-24 DIAGNOSIS — K529 Noninfective gastroenteritis and colitis, unspecified: Secondary | ICD-10-CM | POA: Diagnosis not present

## 2022-12-24 DIAGNOSIS — I130 Hypertensive heart and chronic kidney disease with heart failure and stage 1 through stage 4 chronic kidney disease, or unspecified chronic kidney disease: Secondary | ICD-10-CM | POA: Diagnosis present

## 2022-12-24 DIAGNOSIS — E1122 Type 2 diabetes mellitus with diabetic chronic kidney disease: Secondary | ICD-10-CM

## 2022-12-24 DIAGNOSIS — Z794 Long term (current) use of insulin: Secondary | ICD-10-CM

## 2022-12-24 DIAGNOSIS — D649 Anemia, unspecified: Secondary | ICD-10-CM | POA: Diagnosis present

## 2022-12-24 DIAGNOSIS — I509 Heart failure, unspecified: Secondary | ICD-10-CM

## 2022-12-24 DIAGNOSIS — R0689 Other abnormalities of breathing: Secondary | ICD-10-CM | POA: Diagnosis not present

## 2022-12-24 DIAGNOSIS — I7782 Antineutrophilic cytoplasmic antibody (ANCA) vasculitis: Secondary | ICD-10-CM

## 2022-12-24 DIAGNOSIS — Z8619 Personal history of other infectious and parasitic diseases: Secondary | ICD-10-CM | POA: Diagnosis present

## 2022-12-24 DIAGNOSIS — R1084 Generalized abdominal pain: Secondary | ICD-10-CM | POA: Diagnosis not present

## 2022-12-24 DIAGNOSIS — D631 Anemia in chronic kidney disease: Secondary | ICD-10-CM | POA: Diagnosis present

## 2022-12-24 DIAGNOSIS — I700 Atherosclerosis of aorta: Secondary | ICD-10-CM | POA: Diagnosis present

## 2022-12-24 DIAGNOSIS — I480 Paroxysmal atrial fibrillation: Secondary | ICD-10-CM | POA: Diagnosis present

## 2022-12-24 DIAGNOSIS — Z7189 Other specified counseling: Secondary | ICD-10-CM | POA: Diagnosis not present

## 2022-12-24 DIAGNOSIS — E43 Unspecified severe protein-calorie malnutrition: Secondary | ICD-10-CM | POA: Diagnosis present

## 2022-12-24 DIAGNOSIS — Z66 Do not resuscitate: Secondary | ICD-10-CM | POA: Diagnosis present

## 2022-12-24 DIAGNOSIS — E46 Unspecified protein-calorie malnutrition: Secondary | ICD-10-CM

## 2022-12-24 DIAGNOSIS — K219 Gastro-esophageal reflux disease without esophagitis: Secondary | ICD-10-CM | POA: Diagnosis present

## 2022-12-24 DIAGNOSIS — I272 Pulmonary hypertension, unspecified: Secondary | ICD-10-CM

## 2022-12-24 DIAGNOSIS — I110 Hypertensive heart disease with heart failure: Secondary | ICD-10-CM | POA: Diagnosis not present

## 2022-12-24 DIAGNOSIS — K2980 Duodenitis without bleeding: Secondary | ICD-10-CM | POA: Diagnosis present

## 2022-12-24 DIAGNOSIS — R109 Unspecified abdominal pain: Secondary | ICD-10-CM | POA: Diagnosis not present

## 2022-12-24 DIAGNOSIS — A419 Sepsis, unspecified organism: Principal | ICD-10-CM

## 2022-12-24 DIAGNOSIS — E119 Type 2 diabetes mellitus without complications: Secondary | ICD-10-CM

## 2022-12-24 DIAGNOSIS — D509 Iron deficiency anemia, unspecified: Secondary | ICD-10-CM | POA: Diagnosis present

## 2022-12-24 DIAGNOSIS — N181 Chronic kidney disease, stage 1: Secondary | ICD-10-CM

## 2022-12-24 DIAGNOSIS — I4891 Unspecified atrial fibrillation: Secondary | ICD-10-CM | POA: Diagnosis not present

## 2022-12-24 DIAGNOSIS — D689 Coagulation defect, unspecified: Secondary | ICD-10-CM

## 2022-12-24 DIAGNOSIS — R112 Nausea with vomiting, unspecified: Secondary | ICD-10-CM | POA: Diagnosis not present

## 2022-12-24 DIAGNOSIS — Z952 Presence of prosthetic heart valve: Secondary | ICD-10-CM

## 2022-12-24 DIAGNOSIS — R338 Other retention of urine: Secondary | ICD-10-CM | POA: Diagnosis present

## 2022-12-24 DIAGNOSIS — I959 Hypotension, unspecified: Secondary | ICD-10-CM | POA: Diagnosis not present

## 2022-12-24 DIAGNOSIS — I779 Disorder of arteries and arterioles, unspecified: Secondary | ICD-10-CM | POA: Diagnosis present

## 2022-12-24 DIAGNOSIS — K2970 Gastritis, unspecified, without bleeding: Secondary | ICD-10-CM | POA: Diagnosis not present

## 2022-12-24 DIAGNOSIS — E1165 Type 2 diabetes mellitus with hyperglycemia: Secondary | ICD-10-CM | POA: Diagnosis present

## 2022-12-24 DIAGNOSIS — I10 Essential (primary) hypertension: Secondary | ICD-10-CM | POA: Diagnosis present

## 2022-12-24 DIAGNOSIS — I5032 Chronic diastolic (congestive) heart failure: Secondary | ICD-10-CM | POA: Diagnosis present

## 2022-12-24 DIAGNOSIS — Z515 Encounter for palliative care: Secondary | ICD-10-CM | POA: Diagnosis not present

## 2022-12-24 DIAGNOSIS — E8809 Other disorders of plasma-protein metabolism, not elsewhere classified: Secondary | ICD-10-CM

## 2022-12-24 DIAGNOSIS — R6520 Severe sepsis without septic shock: Secondary | ICD-10-CM | POA: Diagnosis present

## 2022-12-24 DIAGNOSIS — I482 Chronic atrial fibrillation, unspecified: Secondary | ICD-10-CM | POA: Diagnosis present

## 2022-12-24 DIAGNOSIS — I6523 Occlusion and stenosis of bilateral carotid arteries: Secondary | ICD-10-CM | POA: Diagnosis not present

## 2022-12-24 DIAGNOSIS — N184 Chronic kidney disease, stage 4 (severe): Secondary | ICD-10-CM | POA: Diagnosis present

## 2022-12-24 DIAGNOSIS — E7800 Pure hypercholesterolemia, unspecified: Secondary | ICD-10-CM | POA: Diagnosis present

## 2022-12-24 DIAGNOSIS — I2510 Atherosclerotic heart disease of native coronary artery without angina pectoris: Secondary | ICD-10-CM | POA: Diagnosis present

## 2022-12-24 DIAGNOSIS — Z1152 Encounter for screening for COVID-19: Secondary | ICD-10-CM | POA: Diagnosis not present

## 2022-12-24 DIAGNOSIS — E11319 Type 2 diabetes mellitus with unspecified diabetic retinopathy without macular edema: Secondary | ICD-10-CM | POA: Diagnosis present

## 2022-12-24 DIAGNOSIS — F32A Depression, unspecified: Secondary | ICD-10-CM | POA: Diagnosis present

## 2022-12-24 DIAGNOSIS — N179 Acute kidney failure, unspecified: Secondary | ICD-10-CM | POA: Diagnosis present

## 2022-12-24 LAB — HEMOGLOBIN AND HEMATOCRIT, BLOOD
HCT: 22.4 % — ABNORMAL LOW (ref 36.0–46.0)
HCT: 23.4 % — ABNORMAL LOW (ref 36.0–46.0)
HCT: 26.6 % — ABNORMAL LOW (ref 36.0–46.0)
Hemoglobin: 7.1 g/dL — ABNORMAL LOW (ref 12.0–15.0)
Hemoglobin: 7.5 g/dL — ABNORMAL LOW (ref 12.0–15.0)
Hemoglobin: 8.6 g/dL — ABNORMAL LOW (ref 12.0–15.0)

## 2022-12-24 LAB — URINALYSIS, W/ REFLEX TO CULTURE (INFECTION SUSPECTED)
Bacteria, UA: NONE SEEN
Bilirubin Urine: NEGATIVE
Glucose, UA: NEGATIVE mg/dL
Hgb urine dipstick: NEGATIVE
Ketones, ur: NEGATIVE mg/dL
Leukocytes,Ua: NEGATIVE
Nitrite: NEGATIVE
Protein, ur: 100 mg/dL — AB
Specific Gravity, Urine: 1.01 (ref 1.005–1.030)
pH: 5 (ref 5.0–8.0)

## 2022-12-24 LAB — COMPREHENSIVE METABOLIC PANEL
ALT: 15 U/L (ref 0–44)
AST: 32 U/L (ref 15–41)
Albumin: 3.4 g/dL — ABNORMAL LOW (ref 3.5–5.0)
Alkaline Phosphatase: 135 U/L — ABNORMAL HIGH (ref 38–126)
Anion gap: 13 (ref 5–15)
BUN: 103 mg/dL — ABNORMAL HIGH (ref 8–23)
CO2: 18 mmol/L — ABNORMAL LOW (ref 22–32)
Calcium: 9 mg/dL (ref 8.9–10.3)
Chloride: 101 mmol/L (ref 98–111)
Creatinine, Ser: 2.84 mg/dL — ABNORMAL HIGH (ref 0.44–1.00)
GFR, Estimated: 16 mL/min — ABNORMAL LOW (ref >60)
Glucose, Bld: 187 mg/dL — ABNORMAL HIGH (ref 70–99)
Potassium: 3.5 mmol/L (ref 3.5–5.1)
Sodium: 132 mmol/L — ABNORMAL LOW (ref 135–145)
Total Bilirubin: 1.2 mg/dL (ref 0.3–1.2)
Total Protein: 6.1 g/dL — ABNORMAL LOW (ref 6.5–8.1)

## 2022-12-24 LAB — LACTIC ACID, PLASMA
Lactic Acid, Venous: 1.9 mmol/L (ref 0.5–1.9)
Lactic Acid, Venous: 1 mmol/L (ref 0.5–1.9)

## 2022-12-24 LAB — BASIC METABOLIC PANEL
Anion gap: 11 (ref 5–15)
BUN: 97 mg/dL — ABNORMAL HIGH (ref 8–23)
CO2: 18 mmol/L — ABNORMAL LOW (ref 22–32)
Calcium: 8.6 mg/dL — ABNORMAL LOW (ref 8.9–10.3)
Chloride: 104 mmol/L (ref 98–111)
Creatinine, Ser: 2.4 mg/dL — ABNORMAL HIGH (ref 0.44–1.00)
GFR, Estimated: 20 mL/min — ABNORMAL LOW (ref >60)
Glucose, Bld: 143 mg/dL — ABNORMAL HIGH (ref 70–99)
Potassium: 3.9 mmol/L (ref 3.5–5.1)
Sodium: 133 mmol/L — ABNORMAL LOW (ref 135–145)

## 2022-12-24 LAB — CBC
HCT: 28.1 % — ABNORMAL LOW (ref 36.0–46.0)
Hemoglobin: 8.9 g/dL — ABNORMAL LOW (ref 12.0–15.0)
MCH: 31.1 pg (ref 26.0–34.0)
MCHC: 31.7 g/dL (ref 30.0–36.0)
MCV: 98.3 fL (ref 80.0–100.0)
Platelets: 169 10*3/uL (ref 150–400)
RBC: 2.86 MIL/uL — ABNORMAL LOW (ref 3.87–5.11)
RDW: 17.1 % — ABNORMAL HIGH (ref 11.5–15.5)
WBC: 15.2 10*3/uL — ABNORMAL HIGH (ref 4.0–10.5)
nRBC: 0 % (ref 0.0–0.2)

## 2022-12-24 LAB — RESP PANEL BY RT-PCR (RSV, FLU A&B, COVID)  RVPGX2
Influenza A by PCR: NEGATIVE
Influenza B by PCR: NEGATIVE
Resp Syncytial Virus by PCR: NEGATIVE
SARS Coronavirus 2 by RT PCR: NEGATIVE

## 2022-12-24 LAB — PROTIME-INR
INR: 3.5 — ABNORMAL HIGH (ref 0.8–1.2)
Prothrombin Time: 35.2 seconds — ABNORMAL HIGH (ref 11.4–15.2)

## 2022-12-24 LAB — MRSA NEXT GEN BY PCR, NASAL: MRSA by PCR Next Gen: NOT DETECTED

## 2022-12-24 LAB — GLUCOSE, CAPILLARY
Glucose-Capillary: 150 mg/dL — ABNORMAL HIGH (ref 70–99)
Glucose-Capillary: 131 mg/dL — ABNORMAL HIGH (ref 70–99)
Glucose-Capillary: 160 mg/dL — ABNORMAL HIGH (ref 70–99)

## 2022-12-24 LAB — PREPARE RBC (CROSSMATCH)

## 2022-12-24 LAB — APTT: aPTT: 53 seconds — ABNORMAL HIGH (ref 24–36)

## 2022-12-24 LAB — LIPASE, BLOOD: Lipase: 39 U/L (ref 11–51)

## 2022-12-24 LAB — POC OCCULT BLOOD, ED: Fecal Occult Bld: POSITIVE — AB

## 2022-12-24 MED ORDER — SODIUM CHLORIDE 0.9 % IV SOLN
2.0000 g | INTRAVENOUS | Status: DC
  Administered 2022-12-24 – 2022-12-31 (×8): 2 g via INTRAVENOUS
  Filled 2022-12-24 (×8): qty 20

## 2022-12-24 MED ORDER — SODIUM CHLORIDE 0.9% FLUSH
3.0000 mL | Freq: Two times a day (BID) | INTRAVENOUS | Status: DC
  Administered 2022-12-24 – 2022-12-31 (×14): 3 mL via INTRAVENOUS

## 2022-12-24 MED ORDER — ACETAMINOPHEN 500 MG PO TABS
1000.0000 mg | ORAL_TABLET | Freq: Once | ORAL | Status: AC
  Administered 2022-12-24: 1000 mg via ORAL
  Filled 2022-12-24: qty 2

## 2022-12-24 MED ORDER — ACETAMINOPHEN 650 MG RE SUPP: 325.0000 mg | Freq: Four times a day (QID) | RECTAL | Status: DC | PRN

## 2022-12-24 MED ORDER — ONDANSETRON HCL 4 MG/2ML IJ SOLN
4.0000 mg | Freq: Four times a day (QID) | INTRAMUSCULAR | Status: DC | PRN
  Administered 2022-12-25 – 2022-12-26 (×4): 4 mg via INTRAVENOUS
  Filled 2022-12-24 (×5): qty 2

## 2022-12-24 MED ORDER — INSULIN ASPART 100 UNIT/ML IJ SOLN
0.0000 [IU] | Freq: Three times a day (TID) | INTRAMUSCULAR | Status: DC
  Administered 2022-12-25 (×3): 1 [IU] via SUBCUTANEOUS
  Administered 2022-12-26: 2 [IU] via SUBCUTANEOUS
  Administered 2022-12-26: 3 [IU] via SUBCUTANEOUS
  Administered 2022-12-26 – 2022-12-27 (×2): 2 [IU] via SUBCUTANEOUS
  Administered 2022-12-27: 3 [IU] via SUBCUTANEOUS
  Administered 2022-12-27: 1 [IU] via SUBCUTANEOUS
  Administered 2022-12-28: 2 [IU] via SUBCUTANEOUS
  Administered 2022-12-28: 1 [IU] via SUBCUTANEOUS
  Administered 2022-12-29: 2 [IU] via SUBCUTANEOUS
  Administered 2022-12-29 – 2022-12-30 (×2): 1 [IU] via SUBCUTANEOUS
  Administered 2022-12-30: 3 [IU] via SUBCUTANEOUS
  Administered 2022-12-31: 1 [IU] via SUBCUTANEOUS

## 2022-12-24 MED ORDER — SODIUM CHLORIDE 0.9 % IV SOLN
2.0000 g | Freq: Once | INTRAVENOUS | Status: AC
  Administered 2022-12-24: 2 g via INTRAVENOUS
  Filled 2022-12-24: qty 12.5

## 2022-12-24 MED ORDER — SODIUM CHLORIDE 0.9% IV SOLUTION: Freq: Once | INTRAVENOUS | Status: AC

## 2022-12-24 MED ORDER — PANTOPRAZOLE INFUSION (NEW) - SIMPLE MED
8.0000 mg/h | INTRAVENOUS | Status: DC
  Administered 2022-12-24 – 2022-12-25 (×3): 8 mg/h via INTRAVENOUS
  Filled 2022-12-24 (×3): qty 100
  Filled 2022-12-24: qty 80
  Filled 2022-12-24 (×2): qty 100

## 2022-12-24 MED ORDER — SIMETHICONE 80 MG PO CHEW: 80.0000 mg | CHEWABLE_TABLET | Freq: Four times a day (QID) | ORAL | Status: DC | PRN

## 2022-12-24 MED ORDER — VITAMIN B-12 1000 MCG PO TABS
1000.0000 ug | ORAL_TABLET | Freq: Every day | ORAL | Status: DC
  Administered 2022-12-25 – 2022-12-31 (×7): 1000 ug via ORAL
  Filled 2022-12-24 (×9): qty 1

## 2022-12-24 MED ORDER — FUROSEMIDE 10 MG/ML IJ SOLN
40.0000 mg | Freq: Once | INTRAMUSCULAR | Status: AC
  Administered 2022-12-24: 40 mg via INTRAVENOUS
  Filled 2022-12-24: qty 4

## 2022-12-24 MED ORDER — VITAMIN D 25 MCG (1000 UNIT) PO TABS
2000.0000 [IU] | ORAL_TABLET | Freq: Every day | ORAL | Status: DC
  Administered 2022-12-25 – 2022-12-31 (×7): 2000 [IU] via ORAL
  Filled 2022-12-24 (×7): qty 2

## 2022-12-24 MED ORDER — PANTOPRAZOLE SODIUM 40 MG IV SOLR: 40.0000 mg | Freq: Two times a day (BID) | INTRAVENOUS | Status: DC

## 2022-12-24 MED ORDER — ONDANSETRON HCL 4 MG PO TABS: 4.0000 mg | ORAL_TABLET | Freq: Four times a day (QID) | ORAL | Status: DC | PRN

## 2022-12-24 MED ORDER — ACETAMINOPHEN 325 MG PO TABS
325.0000 mg | ORAL_TABLET | Freq: Four times a day (QID) | ORAL | Status: DC | PRN
  Administered 2022-12-24 – 2022-12-25 (×2): 650 mg via ORAL
  Administered 2022-12-25: 500 mg via ORAL
  Filled 2022-12-24 (×3): qty 2

## 2022-12-24 MED ORDER — METOPROLOL SUCCINATE ER 25 MG PO TB24
12.5000 mg | ORAL_TABLET | Freq: Every day | ORAL | Status: DC
  Administered 2022-12-24 – 2022-12-31 (×8): 12.5 mg via ORAL
  Filled 2022-12-24 (×8): qty 1

## 2022-12-24 MED ORDER — ALLOPURINOL 100 MG PO TABS
100.0000 mg | ORAL_TABLET | Freq: Every day | ORAL | Status: DC
  Administered 2022-12-25 – 2022-12-31 (×7): 100 mg via ORAL
  Filled 2022-12-24 (×7): qty 1

## 2022-12-24 MED ORDER — METRONIDAZOLE 500 MG/100ML IV SOLN
500.0000 mg | Freq: Two times a day (BID) | INTRAVENOUS | Status: DC
  Administered 2022-12-24 – 2022-12-31 (×14): 500 mg via INTRAVENOUS
  Filled 2022-12-24 (×15): qty 100

## 2022-12-24 MED ORDER — PANTOPRAZOLE 80MG IVPB - SIMPLE MED
80.0000 mg | Freq: Once | INTRAVENOUS | Status: AC
  Administered 2022-12-24: 80 mg via INTRAVENOUS
  Filled 2022-12-24: qty 100

## 2022-12-24 MED ORDER — CHLORHEXIDINE GLUCONATE CLOTH 2 % EX PADS
6.0000 | MEDICATED_PAD | Freq: Every day | CUTANEOUS | Status: DC
  Administered 2022-12-24 – 2022-12-31 (×8): 6 via TOPICAL

## 2022-12-24 MED ORDER — LACTATED RINGERS IV BOLUS
500.0000 mL | Freq: Once | INTRAVENOUS | Status: AC
  Administered 2022-12-24: 500 mL via INTRAVENOUS

## 2022-12-24 MED ORDER — METRONIDAZOLE 500 MG/100ML IV SOLN
500.0000 mg | Freq: Once | INTRAVENOUS | Status: AC
  Administered 2022-12-24: 500 mg via INTRAVENOUS
  Filled 2022-12-24: qty 100

## 2022-12-24 MED ORDER — LACTATED RINGERS IV SOLN: INTRAVENOUS | Status: DC

## 2022-12-24 NOTE — H&P (Signed)
History and Physical    Patient: [name] [id number] DOB: 1941/01/09 DOA: 12/24/2022 DOS: the patient was seen and examined on 12/24/2022 PCP: [name], MD  Patient coming from: Home  Chief Complaint:  Chief Complaint  Patient presents with   Abdominal Pain   HPI: [name] is an 82 y.o. female with a history of AFib, CAD s/p CABG and mechanical AVR on coumadin, chronic anemia of CKD and iron deficiency, ANCA vasculitis not currently on treatment, PAD s/p stenting, pulmonary HTN, IDT2DM, HTN, HLD, urinary retention who presented to the ED this morning with abrupt onset of abdominal discomfort, moaning. She had no complaints the day before, but had 4-5 soft stools. Stools have been dark but this is unchanged from baseline attributed to iron supplementation. She has bruises on her arms which is normal, no other bleeding. She's had nausea without vomiting. Took coumadin yesterday, not today. No dyspnea or chest pain currently.   She arrived to the ED by EMS who reported dry heaves, had dark stool in the ED that was +FOBT. Was febrile to 102.75F with WBC 15.2k, hgb 8.9g/dl down from 9.8g/dl and INR 3.5, down from 4.4 a couple days prior. BUN 103, SCr 2.84. CXR and urinalysis did not suggest infection. CT abd/pelvis demonstrated wall thickening involving the entire colon and duodenum. Interstitial pulmonary edema suggested as well.   She's had relatively recent admissions and work up for similar presentations with endoscopy not revealing any ulcers or culprit lesions. Also admissions for acute CHF with outpatient palliative care recommended at discharge.   Due to concern for sepsis, broad antibiotics and IV fluid bolus and infusion were administered with blood cultures sent. Protonix infusion initiated and GI consulted for acute GI bleed and hospitalists consulted for admission.  Review of Systems: As mentioned in the history of present illness. All other systems reviewed and  are negative. Past Medical History:  Diagnosis Date   Aortic atherosclerosis ([place]) 01/23/2018   Atrial fibrillation ([facility])    Benign positional vertigo 10/06/2011   Carotid artery disease ([place])    Chemotherapy induced neutropenia ([place]) 07/21/2019   Cholelithiasis 01/23/2018   Cholelithiasis 01/23/2018   Chronic anticoagulation    Colovesical fistula, ruled out 04/14/2022   Coronary artery disease    status post coronary artery bypass grafting times 07/10/2004   Diabetes mellitus    Hypercholesteremia    Hypertension    Mechanical heart valve present    H. aortic valve replacement at the time of bypass surgery October 2005   Moderate to severe pulmonary hypertension ([place])    Peripheral arterial disease ([place])    history of left common iliac artery PTA and stenting for a chronic total occlusion 08/26/01   S/P cholecystectomy 03/06/2018   Past Surgical History:  Procedure Laterality Date   anterior repair  2009   AORTIC VALVE REPLACEMENT  2005   Dr. Cyndia Bent   CARDIAC CATHETERIZATION  11/10/2004   40% right common illiac, 70% in stent restenosis of distal left common illiac,    CARDIAC CATHETERIZATION  05/18/2004   LAD 50-70% midstenosis, RCA dominant w/50% stenosis, 50% Right common Illiac artery ostial stenosis, 90% in stent restenosis within midportion of left common illiac stent   Carotid Duplex  03/12/2012   RSA-elev. velocities suggestive of a 50-69% diameter reduction, Right&Left Bulb/Prox ICA-mild-mod.fibrous plaqueelevating Velocities abnormal study.   CHOLECYSTECTOMY N/A 03/01/2018   Procedure: LAPAROSCOPIC CHOLECYSTECTOMY;  Surgeon: Judeth Horn, MD;  Location: [facility];  Service: General;  Laterality: N/A;  COLONOSCOPY WITH PROPOFOL N/A 01/22/2018   Procedure: COLONOSCOPY WITH PROPOFOL;  Surgeon: Wilford Corner, MD;  Location: [facility];  Service: Endoscopy;  Laterality: N/A;   ESOPHAGOGASTRODUODENOSCOPY N/A 07/13/2022   Procedure: ESOPHAGOGASTRODUODENOSCOPY (EGD);   Surgeon: [name], MD;  Location: [facility];  Service: Gastroenterology;  Laterality: N/A;   ESOPHAGOGASTRODUODENOSCOPY (EGD) WITH PROPOFOL N/A 01/22/2018   Procedure: ESOPHAGOGASTRODUODENOSCOPY (EGD) WITH PROPOFOL;  Surgeon: Wilford Corner, MD;  Location: [facility];  Service: Endoscopy;  Laterality: N/A;   ESOPHAGOGASTRODUODENOSCOPY (EGD) WITH PROPOFOL N/A 11/21/2018   Procedure: ESOPHAGOGASTRODUODENOSCOPY (EGD) WITH PROPOFOL;  Surgeon: [name], MD;  Location: [facility];  Service: Gastroenterology;  Laterality: N/A;   ESOPHAGOGASTRODUODENOSCOPY (EGD) WITH PROPOFOL Left 02/27/2019   Procedure: ESOPHAGOGASTRODUODENOSCOPY (EGD) WITH PROPOFOL;  Surgeon: Arta Silence, MD;  Location: [facility];  Service: Endoscopy;  Laterality: Left;   GIVENS CAPSULE STUDY N/A 11/21/2018   Procedure: GIVENS CAPSULE STUDY;  Surgeon: [name], MD;  Location: [facility];  Service: Gastroenterology;  Laterality: N/A;  To be deployed during EGD   Lower Ext. Duplex  03/12/2012   Right Proximal CIA- vessel narrowing w/elevated velocities 0-49% diameter reduction. Right SFA-mild mixed density plaque throughout vessel.   NM MYOCAR PERF WALL MOTION  05/19/2010   protocol: Persantine, post stress EF 65%, negative for ischemia, low risk scan   RIGHT HEART CATH N/A 06/27/2018   Procedure: RIGHT HEART CATH;  Surgeon: [name], MD;  Location: [facility];  Service: Cardiovascular;  Laterality: N/A;   TOTAL ABDOMINAL HYSTERECTOMY W/ BILATERAL SALPINGOOPHORECTOMY  1989   TRANSTHORACIC ECHOCARDIOGRAM  08/29/2012   Moderately calcified annulus of mitral valve, moderate regurg. of both mitral valve and tricuspid valve.    Social History:  reports that she has quit smoking. Her smoking use included cigarettes. She has a 30.00 pack-year smoking history. She has never used smokeless tobacco. She reports that she does not drink alcohol and does not use drugs.  Allergies  Allergen Reactions   Flagyl  [Metronidazole] Rash and Other (See Comments)    ALL-OVER BODY RASH   Augmentin [Amoxicillin-Pot Clavulanate] Diarrhea and Nausea And Vomiting   Ciprofloxacin Diarrhea, Nausea Only and Other (See Comments)    Stomach ache   Codeine     Insomnia, anxiety   Coreg [Carvedilol] Other (See Comments)    Terrible cramping in the feet and had a lot of bowel movements, but not diarrhea   Diflucan [Fluconazole] Diarrhea   Levofloxacin In D5w     Vomiting, diarrhea, insomnia   Losartan Swelling and Other (See Comments)    Patient doesn't recall site of swelling   Privigen [Immune Globulin (Human)] Other (See Comments)    Severe/excruciating pain   Sulfamethoxazole-Trimethoprim Diarrhea, Nausea Only and Other (See Comments)    Stomach upset   Verapamil Hives   Zetia [Ezetimibe] Other (See Comments)    Reaction not recalled   Zocor [Simvastatin - High Dose] Other (See Comments)    Reaction not recalled   Gatifloxacin Rash and Other (See Comments)    Redness to skin around eye    Family History  Problem Relation Age of Onset   Heart disease Father    Hypertension Father    Hyperlipidemia Father    Breast cancer Neg Hx     Prior to Admission medications   Medication Sig Start Date End Date Taking? Authorizing Provider  acetaminophen (TYLENOL) 325 MG tablet Take 325 mg by mouth every 6 (six) hours as needed for mild pain or fever.  Yes [provider]  allopurinol (ZYLOPRIM) 100 MG tablet Take 100 mg by mouth daily.   Yes [provider]  ALPRAZolam Duanne Moron) 0.5 MG tablet Take 0.5 mg by mouth daily.   Yes [provider]  amLODipine (NORVASC) 10 MG tablet TAKE 1 TABLET EVERY DAY Patient taking differently: Take 10 mg by mouth daily. 05/17/22  Yes [name], MD  aspirin 81 MG chewable tablet Chew 81 mg by mouth daily. 06/29/22  Yes [provider]  atorvastatin (LIPITOR) 80 MG tablet Take 1 tablet (80 mg total) by mouth daily at 6 PM. 11/22/18  Yes  [name], MD  Cholecalciferol (VITAMIN D3) 50 MCG (2000 UT) capsule Take 2,000 Units by mouth daily with lunch. 11/26/18  Yes [provider]  Cyanocobalamin (VITAMIN B12) 1000 MCG TBCR Take 1,000 mcg by mouth daily with lunch.   Yes [provider]  famotidine (PEPCID) 20 MG tablet Take 20 mg by mouth 2 (two) times daily.   Yes [provider]  ferrous sulfate 325 (65 FE) MG tablet Take 1 tablet (325 mg total) by mouth daily with breakfast. 09/06/22  Yes Johnson, Clanford L, MD  furosemide (LASIX) 40 MG tablet TAKE 2 TABLETS IN MORNING AND 1 AND 1/2 TABLETS IN EVENING. CHANGE IN DOSAGE. Patient taking differently: Take 50-80 mg by mouth See admin instructions. Take 80mg  by mouth in the morning and take 50mg  by mouth in the evening. 10/30/22  Yes [name], MD  hydrALAZINE (APRESOLINE) 100 MG tablet TAKE 1 TABLET THREE TIMES DAILY Patient taking differently: Take 100 mg by mouth 3 (three) times daily. 02/17/22  Yes [name], MD  insulin aspart (NOVOLOG) 100 UNIT/ML injection Inject 2-8 Units into the skin 3 (three) times daily with meals. Per sliding scale, If blood sugar is 200 to 250, give 2 units. If blood sugar is 251 to 300, give 4 units. If blood sugar is 301 to 350, give 6 units. If blood sugar is greater than 350, give 8 units and call MD. 09/05/22  Yes Johnson, Clanford L, MD  metoprolol succinate (TOPROL-XL) 25 MG 24 hr tablet TAKE 1/2 TABLET EVERY DAY 10/30/22  Yes [name], MD  ondansetron (ZOFRAN-ODT) 4 MG disintegrating tablet Take 4 mg by mouth every 8 (eight) hours as needed for nausea or vomiting. 01/12/22  Yes [provider]  pantoprazole (PROTONIX) 40 MG tablet Take 40 mg by mouth 2 (two) times daily. 06/25/22  Yes [provider]  sertraline (ZOLOFT) 25 MG tablet Take 25 mg by mouth daily.   Yes [provider]  simethicone (GAS-X) 80 MG chewable tablet Chew 1 tablet (80 mg total) by mouth 4 (four)  times daily as needed for flatulence. 04/16/22 04/16/23 Yes Danford, Suann Larry, MD  tadalafil, PAH, (ADCIRCA) 20 MG tablet TAKE 2 TABLETS BY MOUTH ONCE DAILY 12/11/22  Yes [name], MD  warfarin (COUMADIN) 5 MG tablet Take 1 tablet (5 mg total) by mouth See admin instructions. Take 1 tablet ever day except for on Sundays 1 and 1/2 tablets Patient taking differently: Take 5 mg by mouth daily. 09/05/22  Yes Johnson, Clanford L, MD  ALPRAZolam (XANAX) 0.25 MG tablet Take 1 tablet (0.25 mg total) by mouth 2 (two) times daily as needed for anxiety or sleep. Patient not taking: Reported on 12/24/2022 09/05/22   Murlean Iba, MD  feeding supplement, GLUCERNA SHAKE, (GLUCERNA SHAKE) LIQD Take 237 mLs by mouth 3 (three) times daily between meals.  Patient not taking: Reported on 12/24/2022 09/05/22   Murlean Iba, MD  Insulin Syringe-Needle U-100 (B-D INS SYR ULTRAFINE .3CC/29G) 29G X 1/2" 0.3 ML MISC Use for sliding scale for breakthrough high blood sugar readings 02/24/22   Benay Pike, MD    Physical Exam: Vitals:   12/24/22 0950 12/24/22 0953 12/24/22 1200 12/24/22 1330  BP: 113/60  (!) 141/53 125/64  Pulse: 73  61 73  Resp: 18  (!) 21 (!) 23  Temp: 100.3 F (37.9 C) 100.3 F (37.9 C)  100.3 F (37.9 C)  TempSrc: Rectal Rectal  Rectal  SpO2: 95%  92% 95%  Weight:      Height:      Gen: Elderly frail female in no acute distress laying nearly flat in bed on left side.  Pulm: Diminished L > R base, no crackles or wheezes. Normal respiratory effort and rate.  CV: A flutter with regular rate on monitor, Mechanical S2, no significant murmur or rub or gallop. No JVD. 1+ pitting symmetric LE edema which is baseline per daughter. GI: Soft, tender mostly on left without rebound. +guarding. +BS. Not distended. Neuro: Alert and oriented. No new focal deficits. Ext: Warm, no deformities. Skin: Widespread ecchymoses/senile purpura without acute rashes or any bleeding noted on  visualized skin   Data Reviewed: WBC 15.2k (from 7.2k on 3/15) Hgb 8.9 > 7.1g/dl (baseline ~9-10, was 9.8 recently) Plt 169k  Na 132 bicarb 18, BUN 103, Cr 2.84 (from 2.6), Alk Phos 135, TBili 1.2, LFTs otherwise normal. Lactic acid 1.9 > 1.0  INR 3.5.  +FOBT  Personal interpretations of radiology data:  CXR with blunted left CPA suggestive of small pleural effusion, interstitial prominence noted.  CT with diffuse edema involving colonic wall, 2nd and 3rd segments of duodenum and pulmonary interstitial prominence. Sigmoid diverticulosis without abscess, focal inflammation   ECG: Atrial flutter with ventricular rate averaging in 90's bpm, variable conduction.   Assessment and Plan: Principal Problem:   Acute GI bleeding Active Problems:   H/O mechanical aortic valve replacement   Acute on chronic anemia   Acute urinary retention   Chronic diastolic CHF (congestive heart failure) (HCC)   Diabetes mellitus type 2, controlled (HCC)   PAF (paroxysmal atrial fibrillation) (HCC)   Vasculitis, ANCA positive   CKD (chronic kidney disease), stage IV (HCC)   History of Clostridioides difficile colitis   DNR (do not resuscitate)/DNI(Do Not Intubate)   Essential hypertension   Hypercholesteremia   Carotid artery disease (HCC)   Peripheral arterial disease (HCC)   Aortic atherosclerosis (HCC)   GERD without esophagitis   Chronic atrial fibrillation   Moderate to severe pulmonary hypertension (HCC)   Iron deficiency anemia   Diabetic retinopathy associated with type 2 diabetes mellitus (HCC)   Colitis   Hypoalbuminemia due to protein-calorie malnutrition (HCC)   Coagulopathy (HCC)   Melena   Duodenitis   Noninfectious gastroenteritis   CAD, multiple vessel  Acute GI bleeding causing acute blood loss anemia on anemia of CKD and iron deficiency:  - PPI - Clear liquid diet per GI. Offered EGD, plan to defer for now.  - Admit to stepdown unit, keep 2 IVs, largest bore reasonable.  Hgb is decreasing precipitously and patient has very little physiologic reserve. Transfuse 1u PRBCs, recheck H/H serially - Hold coumadin as below - Plan to continue IV iron and retacrit infusions per outpatient hematology, Dr. Chryl Heck.  Sepsis, perhaps due to colitis: Tachycardic, tachypneic, febrile, with leukocytosis on admission.  - Patient  has history of C. diff colitis documented. Check that and GI panel with pancolitis suggested by CT. Duodenal thickening noted not to be new. Generalized edema also likely contributing to CT findings. If +CDiff, give oral vancomycin. - IV CTX and flagyl for now, monitor blood cultures - Prn tylenol  Mechanical AVR on lifelong coumadin: INR at top end of therapeutic range at 3.5, down from recent at 4.4.  - Hold coumadin for now, monitor INR regularly.   AKI on stage IV CKD: Dialysis is not likely to be within patient's goals of care.  - Avoid nephrotoxins, monitor BMP  Acute on chronic urinary retention: Bladder appeared unremarkable, no hydronephrosis by CT. Retaining >300cc urine currently. Discharged from last admission with foley.  - Insert foley due to retention and critical illness and hx retention  Chronic HFpEF: Had admission for acute decompensation Nov 2023.  - Give lasix with transfusion. If vital signs remain stable, will hope to continue her usual lasix (80mg  qAM, 60mg  qPM po) and cardiac medications.  - Will need to follow volume status very closely.  CAD s/p CABG, chronic atrial fibrillation/flutter, PAD, HTN, HLD: No chest pain. Rate is controlled. - Continue lowest dose metoprolol - Hold norvasc, hydralazine for now.  - Continue atorvastatin with normal AST/ALT  Pulmonary HTN:  - Product insert states tadalafil should not be used with CrCl < 19ml/min so we will not continue that at this time.  Depression, anxiety:  - Hold medications until she wakes up more.   IDT2DM: Glucose is elevated. HbA1c 6.3% - Give very sensitive sliding  scale.   History of ANCA vasculitis: No longer on rituximab or prednisone.   Advance Care Planning:   Code Status: DNR confirmed w/pt and daughter at bedside  Consults: GI  Severity of Illness: The appropriate patient status for this patient is INPATIENT. Inpatient status is judged to be reasonable and necessary in order to provide the required intensity of service to ensure the patient's safety. [name] presenting symptoms, physical exam findings, and initial radiographic and laboratory data in the context of their chronic comorbidities is felt to place them at high risk for further clinical deterioration. Furthermore, it is not anticipated that the patient will be medically stable for discharge from the hospital within 2 midnights of admission.   * I certify that at the point of admission it is my clinical judgment that the patient will require inpatient hospital care spanning beyond 2 midnights from the point of admission due to high intensity of service, high risk for further deterioration and high frequency of surveillance required.*  Author: Patrecia Pour, MD 12/24/2022 2:00 PM  For on call review [email or web address].

## 2022-12-24 NOTE — ED Triage Notes (Signed)
Pt bib EMS after son called stating pt was having severe abdominal pain. Per son, pt had "something like this before and was constipated". Per EMS, pt was on toilet when they arrived and they noted dark tarry stool in toilet. Pt on blood thinners d/t hx of Afib. EMS states pt c/o nausea en route and had "dry heaves". Pt given Zofran 4mg  IV and 100cc NS via EMS.

## 2022-12-24 NOTE — ED Notes (Signed)
Pt has noticeable swelling and bruising on R arm from a recent fall per family member. Skin tears also noted on R forearm

## 2022-12-24 NOTE — Progress Notes (Signed)
Patient arrived to the unit about 2pm, A&Ox3 not sure why she came in to the hospital. No distress noted. Patient oriented to staff and unit and equipment. Safety precautions in place. Patient daughter is present at bedside. Call light within reach. Safety education provided. Plan of care reviewed with patient and family and they agree. Will continue to monitor and endorse.

## 2022-12-24 NOTE — ED Notes (Signed)
Lab at bedside drawing additional labs

## 2022-12-24 NOTE — ED Notes (Signed)
Pt had large BM upon arrival. Pt cleaned and new depends applied.

## 2022-12-24 NOTE — ED Provider Notes (Signed)
Patient CT scan shows some inflammation consistent with colitis of some thickening of the first part of the duodenum which could be ulcer.  Patient presenting with melena.  Vital signs remaining reasonably stable.  Was febrile so had sepsis workup initially but lactic acid is normal at 1.  Hemoglobin a little bit lower than it was just a few days ago at 8.9.  Patient's Coumadin level INR 3.5 so has a coagulopathy related to that.  Discussed with Dr. Abbey Chatters from GI.  He agrees with the broad-spectrum antibiotics to the Protonix drip.  He will come see the patient.  Patient will be admitted by hospitalist service.  Patient is a DNR.  Patient's stools were heme positive.  White count was 15.2.   Fredia Sorrow, MD 12/24/22 [phone]

## 2022-12-24 NOTE — Consult Note (Signed)
Consulting  Provider: Dr. Rogene Houston Primary Care Physician:  [name], MD Primary Gastroenterologist: [facility]  Reason for Consultation: Colitis, duodenitis  HPI:  [name] is a 82 y.o. female with a past medical history of mechanical aortic valve chronically on Coumadin, ANCA vasculitis, CKD, atrial fibrillation, type 2 diabetes, anemia of chronic disease, who presented to [facility], ER due to abdominal pain, nausea, and "feeling sick."  Per family, patient woke up in the middle of the night moaning in pain.  Patient reports lower abdominal pain.  Also with nausea without vomiting.  States she has had dark stools though they are chronically dark because she takes iron.  In the ER, found to have a hemoglobin 8.9 which is slightly down from her baseline, creatinine 2.84, BUN 103, INR 3.5.  CT abdomen pelvis which I personally reviewed showed mild diffuse colonic wall thickening concerning for colitis, possible edema in the second and third portions of the duodenum.  She has been started on IV Protonix as well as IV antibiotics.  Patient had a very similar presentation to [facility] October 2023 for dark loose stools/diarrhea and abdominal pain.  CT at that time showed duodenal wall thickening concerning for duodenitis versus duodenal diverticulitis.  She underwent EGD 07/13/2022 which showed candidal esophagitis (treated with Diflucan), few small erosions in the gastric fundus and body, nonbleeding diverticulum of the second portion of the duodenum, dark pigmented mucosa of the duodenum.  Treated conservatively and discharged home.  Past endoscopic history:  EGD 02/27/19 (Dr. Paulita Fujita) for iron deficiency anemia and suspected upper GI bleed with findings of small hiatal hernia, patchy mildly friable mucosa with contact bleeding in gastric fundus, body and antrum. Normal appearing duodenal bulb and second portion of duodenum.  Colonoscopy 01/22/18 (Dr. Michail Sermon) for iron  deficiency anemia (first colonoscopy) with findings of diverticulosis in descending and sigmoid colon, internal hemorrhoids.  Past Medical History:  Diagnosis Date   Aortic atherosclerosis ([place]) 01/23/2018   Atrial fibrillation (HCC)    Benign positional vertigo 10/06/2011   Carotid artery disease ([place])    Chemotherapy induced neutropenia ([place]) 07/21/2019   Cholelithiasis 01/23/2018   Cholelithiasis 01/23/2018   Chronic anticoagulation    Colovesical fistula, ruled out 04/14/2022   Coronary artery disease    status post coronary artery bypass grafting times 07/10/2004   Diabetes mellitus    Hypercholesteremia    Hypertension    Mechanical heart valve present    H. aortic valve replacement at the time of bypass surgery October 2005   Moderate to severe pulmonary hypertension ([place])    Peripheral arterial disease ([place])    history of left common iliac artery PTA and stenting for a chronic total occlusion 08/26/01   S/P cholecystectomy 03/06/2018    Past Surgical History:  Procedure Laterality Date   anterior repair  2009   AORTIC VALVE REPLACEMENT  2005   Dr. Cyndia Bent   CARDIAC CATHETERIZATION  11/10/2004   40% right common illiac, 70% in stent restenosis of distal left common illiac,    CARDIAC CATHETERIZATION  05/18/2004   LAD 50-70% midstenosis, RCA dominant w/50% stenosis, 50% Right common Illiac artery ostial stenosis, 90% in stent restenosis within midportion of left common illiac stent   Carotid Duplex  03/12/2012   RSA-elev. velocities suggestive of a 50-69% diameter reduction, Right&Left Bulb/Prox ICA-mild-mod.fibrous plaqueelevating Velocities abnormal study.   CHOLECYSTECTOMY N/A 03/01/2018   Procedure: LAPAROSCOPIC CHOLECYSTECTOMY;  Surgeon: Judeth Horn, MD;  Location: [place];  Service: General;  Laterality: N/A;   COLONOSCOPY WITH PROPOFOL N/A 01/22/2018   Procedure: COLONOSCOPY WITH PROPOFOL;  Surgeon: Wilford Corner, MD;  Location: [place];  Service: Endoscopy;   Laterality: N/A;   ESOPHAGOGASTRODUODENOSCOPY N/A 07/13/2022   Procedure: ESOPHAGOGASTRODUODENOSCOPY (EGD);  Surgeon: [name], MD;  Location: [facility];  Service: Gastroenterology;  Laterality: N/A;   ESOPHAGOGASTRODUODENOSCOPY (EGD) WITH PROPOFOL N/A 01/22/2018   Procedure: ESOPHAGOGASTRODUODENOSCOPY (EGD) WITH PROPOFOL;  Surgeon: Wilford Corner, MD;  Location: [place];  Service: Endoscopy;  Laterality: N/A;   ESOPHAGOGASTRODUODENOSCOPY (EGD) WITH PROPOFOL N/A 11/21/2018   Procedure: ESOPHAGOGASTRODUODENOSCOPY (EGD) WITH PROPOFOL;  Surgeon: [name], MD;  Location: [place];  Service: Gastroenterology;  Laterality: N/A;   ESOPHAGOGASTRODUODENOSCOPY (EGD) WITH PROPOFOL Left 02/27/2019   Procedure: ESOPHAGOGASTRODUODENOSCOPY (EGD) WITH PROPOFOL;  Surgeon: Arta Silence, MD;  Location: [facility];  Service: Endoscopy;  Laterality: Left;   GIVENS CAPSULE STUDY N/A 11/21/2018   Procedure: GIVENS CAPSULE STUDY;  Surgeon: [name], MD;  Location: [place];  Service: Gastroenterology;  Laterality: N/A;  To be deployed during EGD   Lower Ext. Duplex  03/12/2012   Right Proximal CIA- vessel narrowing w/elevated velocities 0-49% diameter reduction. Right SFA-mild mixed density plaque throughout vessel.   NM MYOCAR PERF WALL MOTION  05/19/2010   protocol: Persantine, post stress EF 65%, negative for ischemia, low risk scan   RIGHT HEART CATH N/A 06/27/2018   Procedure: RIGHT HEART CATH;  Surgeon: [name], MD;  Location: [place] [facility];  Service: Cardiovascular;  Laterality: N/A;   TOTAL ABDOMINAL HYSTERECTOMY W/ BILATERAL SALPINGOOPHORECTOMY  1989   TRANSTHORACIC ECHOCARDIOGRAM  08/29/2012   Moderately calcified annulus of mitral valve, moderate regurg. of both mitral valve and tricuspid valve.     Prior to Admission medications   Medication Sig Start Date End Date Taking? Authorizing Provider  acetaminophen (TYLENOL) 325 MG tablet Take 325 mg by mouth every 6  (six) hours as needed for mild pain or fever.   Yes [provider]  allopurinol (ZYLOPRIM) 100 MG tablet Take 100 mg by mouth daily.   Yes [provider]  ALPRAZolam Duanne Moron) 0.5 MG tablet Take 0.5 mg by mouth daily.   Yes [provider]  amLODipine (NORVASC) 10 MG tablet TAKE 1 TABLET EVERY DAY Patient taking differently: Take 10 mg by mouth daily. 05/17/22  Yes [name], MD  aspirin 81 MG chewable tablet Chew 81 mg by mouth daily. 06/29/22  Yes [provider]  atorvastatin (LIPITOR) 80 MG tablet Take 1 tablet (80 mg total) by mouth daily at 6 PM. 11/22/18  Yes [name], MD  Cholecalciferol (VITAMIN D3) 50 MCG (2000 UT) capsule Take 2,000 Units by mouth daily with lunch. 11/26/18  Yes [provider]  Cyanocobalamin (VITAMIN B12) 1000 MCG TBCR Take 1,000 mcg by mouth daily with lunch.   Yes [provider]  famotidine (PEPCID) 20 MG tablet Take 20 mg by mouth 2 (two) times daily.   Yes [provider]  ferrous sulfate 325 (65 FE) MG tablet Take 1 tablet (325 mg total) by mouth daily with breakfast. 09/06/22  Yes Johnson, Clanford L, MD  furosemide (LASIX) 40 MG tablet TAKE 2 TABLETS IN MORNING AND 1 AND 1/2 TABLETS IN EVENING. CHANGE IN DOSAGE. Patient taking differently: Take 50-80 mg by mouth See admin instructions. Take 80mg  by mouth in the morning and take 50mg  by mouth in the evening. 10/30/22  Yes [name], MD  hydrALAZINE (APRESOLINE) 100 MG tablet TAKE 1 TABLET  THREE TIMES DAILY Patient taking differently: Take 100 mg by mouth 3 (three) times daily. 02/17/22  Yes [name], MD  insulin aspart (NOVOLOG) 100 UNIT/ML injection Inject 2-8 Units into the skin 3 (three) times daily with meals. Per sliding scale, If blood sugar is 200 to 250, give 2 units. If blood sugar is 251 to 300, give 4 units. If blood sugar is 301 to 350, give 6 units. If blood sugar is greater than 350, give 8 units and call MD.  09/05/22  Yes Johnson, Clanford L, MD  metoprolol succinate (TOPROL-XL) 25 MG 24 hr tablet TAKE 1/2 TABLET EVERY DAY 10/30/22  Yes [name], MD  ondansetron (ZOFRAN-ODT) 4 MG disintegrating tablet Take 4 mg by mouth every 8 (eight) hours as needed for nausea or vomiting. 01/12/22  Yes [provider]  pantoprazole (PROTONIX) 40 MG tablet Take 40 mg by mouth 2 (two) times daily. 06/25/22  Yes [provider]  sertraline (ZOLOFT) 25 MG tablet Take 25 mg by mouth daily.   Yes [provider]  simethicone (GAS-X) 80 MG chewable tablet Chew 1 tablet (80 mg total) by mouth 4 (four) times daily as needed for flatulence. 04/16/22 04/16/23 Yes Danford, Suann Larry, MD  tadalafil, PAH, (ADCIRCA) 20 MG tablet TAKE 2 TABLETS BY MOUTH ONCE DAILY 12/11/22  Yes [name], MD  warfarin (COUMADIN) 5 MG tablet Take 1 tablet (5 mg total) by mouth See admin instructions. Take 1 tablet ever day except for on Sundays 1 and 1/2 tablets Patient taking differently: Take 5 mg by mouth daily. 09/05/22  Yes Johnson, Clanford L, MD  ALPRAZolam (XANAX) 0.25 MG tablet Take 1 tablet (0.25 mg total) by mouth 2 (two) times daily as needed for anxiety or sleep. Patient not taking: Reported on 12/24/2022 09/05/22   Murlean Iba, MD  feeding supplement, GLUCERNA SHAKE, (GLUCERNA SHAKE) LIQD Take 237 mLs by mouth 3 (three) times daily between meals. Patient not taking: Reported on 12/24/2022 09/05/22   Murlean Iba, MD  Insulin Syringe-Needle U-100 (B-D INS SYR ULTRAFINE .3CC/29G) 29G X 1/2" 0.3 ML MISC Use for sliding scale for breakthrough high blood sugar readings 02/24/22   Benay Pike, MD    Current Facility-Administered Medications  Medication Dose Route Frequency Provider Last Rate Last Admin   [START ON 12/27/2022] pantoprazole (PROTONIX) injection 40 mg  40 mg Intravenous Q12H Horton, Barbette Hair, MD       pantoprozole (PROTONIX) 80 mg /NS 100 mL infusion  8 mg/hr Intravenous  Continuous Horton, Barbette Hair, MD 10 mL/hr at 12/24/22 0807 8 mg/hr at 12/24/22 0807   Current Outpatient Medications  Medication Sig Dispense Refill   acetaminophen (TYLENOL) 325 MG tablet Take 325 mg by mouth every 6 (six) hours as needed for mild pain or fever.     allopurinol (ZYLOPRIM) 100 MG tablet Take 100 mg by mouth daily.     ALPRAZolam (XANAX) 0.5 MG tablet Take 0.5 mg by mouth daily.     amLODipine (NORVASC) 10 MG tablet TAKE 1 TABLET EVERY DAY (Patient taking differently: Take 10 mg by mouth daily.) 90 tablet 3   aspirin 81 MG chewable tablet Chew 81 mg by mouth daily.     atorvastatin (LIPITOR) 80 MG tablet Take 1 tablet (80 mg total) by mouth daily at 6 PM.     Cholecalciferol (VITAMIN D3) 50 MCG (2000 UT) capsule Take 2,000 Units by mouth daily with lunch.     Cyanocobalamin (VITAMIN B12) 1000  MCG TBCR Take 1,000 mcg by mouth daily with lunch.     famotidine (PEPCID) 20 MG tablet Take 20 mg by mouth 2 (two) times daily.     ferrous sulfate 325 (65 FE) MG tablet Take 1 tablet (325 mg total) by mouth daily with breakfast.  3   furosemide (LASIX) 40 MG tablet TAKE 2 TABLETS IN MORNING AND 1 AND 1/2 TABLETS IN EVENING. CHANGE IN DOSAGE. (Patient taking differently: Take 50-80 mg by mouth See admin instructions. Take 80mg  by mouth in the morning and take 50mg  by mouth in the evening.) 315 tablet 3   hydrALAZINE (APRESOLINE) 100 MG tablet TAKE 1 TABLET THREE TIMES DAILY (Patient taking differently: Take 100 mg by mouth 3 (three) times daily.) 270 tablet 3   insulin aspart (NOVOLOG) 100 UNIT/ML injection Inject 2-8 Units into the skin 3 (three) times daily with meals. Per sliding scale, If blood sugar is 200 to 250, give 2 units. If blood sugar is 251 to 300, give 4 units. If blood sugar is 301 to 350, give 6 units. If blood sugar is greater than 350, give 8 units and call MD.     metoprolol succinate (TOPROL-XL) 25 MG 24 hr tablet TAKE 1/2 TABLET EVERY DAY 45 tablet 3   ondansetron  (ZOFRAN-ODT) 4 MG disintegrating tablet Take 4 mg by mouth every 8 (eight) hours as needed for nausea or vomiting.     pantoprazole (PROTONIX) 40 MG tablet Take 40 mg by mouth 2 (two) times daily.     sertraline (ZOLOFT) 25 MG tablet Take 25 mg by mouth daily.     simethicone (GAS-X) 80 MG chewable tablet Chew 1 tablet (80 mg total) by mouth 4 (four) times daily as needed for flatulence. 100 tablet 2   tadalafil, PAH, (ADCIRCA) 20 MG tablet TAKE 2 TABLETS BY MOUTH ONCE DAILY 60 tablet 11   warfarin (COUMADIN) 5 MG tablet Take 1 tablet (5 mg total) by mouth See admin instructions. Take 1 tablet ever day except for on Sundays 1 and 1/2 tablets (Patient taking differently: Take 5 mg by mouth daily.)     ALPRAZolam (XANAX) 0.25 MG tablet Take 1 tablet (0.25 mg total) by mouth 2 (two) times daily as needed for anxiety or sleep. (Patient not taking: Reported on 12/24/2022) 10 tablet 0   feeding supplement, GLUCERNA SHAKE, (GLUCERNA SHAKE) LIQD Take 237 mLs by mouth 3 (three) times daily between meals. (Patient not taking: Reported on 12/24/2022)  0   Insulin Syringe-Needle U-100 (B-D INS SYR ULTRAFINE .3CC/29G) 29G X 1/2" 0.3 ML MISC Use for sliding scale for breakthrough high blood sugar readings 100 each 1    Allergies as of 12/24/2022 - Review Complete 12/24/2022  Allergen Reaction Noted   Flagyl [metronidazole] Rash and Other (See Comments) 02/21/2018   Augmentin [amoxicillin-pot clavulanate] Diarrhea and Nausea And Vomiting 04/18/2022   Ciprofloxacin Diarrhea, Nausea Only, and Other (See Comments) 05/13/2021   Codeine  10/27/2022   Coreg [carvedilol] Other (See Comments) 01/18/2018   Diflucan [fluconazole] Diarrhea 02/04/2022   Levofloxacin in d5w  11/10/2022   Losartan Swelling and Other (See Comments) 08/22/2013   Privigen [immune globulin (human)] Other (See Comments) 02/07/2022   Sulfamethoxazole-trimethoprim Diarrhea, Nausea Only, and Other (See Comments) 05/13/2021   Verapamil Hives  08/22/2013   Zetia [ezetimibe] Other (See Comments) 10/06/2011   Zocor [simvastatin - high dose] Other (See Comments) 10/06/2011   Gatifloxacin Rash and Other (See Comments) 03/30/2020    Family History  Problem Relation Age  of Onset   Heart disease Father    Hypertension Father    Hyperlipidemia Father    Breast cancer Neg Hx     Social History   Socioeconomic History   Marital status: Widowed    Spouse name: Not on file   Number of children: Not on file   Years of education: Not on file   Highest education level: Not on file  Occupational History   Occupation: Retired  Tobacco Use   Smoking status: Former    Packs/day: 1.00    Years: 30.00    Additional pack years: 0.00    Total pack years: 30.00    Types: Cigarettes   Smokeless tobacco: Never   Tobacco comments:    quit smoking 2005  Vaping Use   Vaping Use: Never used  Substance and Sexual Activity   Alcohol use: No    Alcohol/week: 0.0 standard drinks of alcohol   Drug use: No   Sexual activity: Not Currently    Birth control/protection: None  Other Topics Concern   Not on file  Social History Narrative   Not on file   Social Determinants of Health   Financial Resource Strain: Low Risk  (02/28/2022)   Overall Financial Resource Strain (CARDIA)    Difficulty of Paying Living Expenses: Not hard at all  Food Insecurity: No Food Insecurity (08/31/2022)   Hunger Vital Sign    Worried About Running Out of Food in the Last Year: Never true    [place] in the Last Year: Never true  Transportation Needs: No Transportation Needs (08/31/2022)   PRAPARE - Hydrologist (Medical): No    Lack of Transportation (Non-Medical): No  Physical Activity: Inactive (02/28/2022)   Exercise Vital Sign    Days of Exercise per Week: 0 days    Minutes of Exercise per Session: 0 min  Stress: No Stress Concern Present (02/28/2022)   [place]    Feeling of Stress : Only a little  Social Connections: Moderately Integrated (06/22/2022)   Social Connection and Isolation Panel [NHANES]    Frequency of Communication with Friends and Family: More than three times a week    Frequency of Social Gatherings with Friends and Family: More than three times a week    Attends Religious Services: More than 4 times per year    Active Member of [employer] or Organizations: Yes    Attends Archivist Meetings: More than 4 times per year    Marital Status: Widowed  Intimate Partner Violence: Not At Risk (08/31/2022)   Humiliation, Afraid, Rape, and Kick questionnaire    Fear of Current or Ex-Partner: No    Emotionally Abused: No    Physically Abused: No    Sexually Abused: No    Review of Systems: General: Negative for anorexia, weight loss, fever, chills, positive for generalized weakness/fatigue Eyes: Negative for vision changes.  ENT: Negative for hoarseness, difficulty swallowing , nasal congestion. CV: Negative for chest pain, angina, palpitations, dyspnea on exertion, peripheral edema.  Respiratory: Negative for dyspnea at rest, dyspnea on exertion, cough, sputum, wheezing.  GI: See history of present illness. GU:  Negative for dysuria, hematuria, urinary incontinence, urinary frequency, nocturnal urination.  MS: Negative for joint pain, low back pain.  Derm: Negative for rash or itching.  Neuro: Negative for weakness, abnormal sensation, seizure, frequent headaches, memory loss, confusion.  Psych: Negative for anxiety, depression Endo: Negative  for unusual weight change.  Heme: Negative for bruising or bleeding. Allergy: Negative for rash or hives.  Physical Exam: Vital signs in last 24 hours: Temp:  [100.3 F (37.9 C)-102.8 F (39.3 C)] 100.3 F (37.9 C) (03/17 0953) Pulse Rate:  [73-102] 73 (03/17 0950) Resp:  [17-26] 18 (03/17 0950) BP: (113-164)/(50-107) 113/60 (03/17 0950) SpO2:  [94 %-98 %] 95 % (03/17  0950) Weight:  [52 kg] 52 kg (03/17 0453)   General:   Alert,  Well-developed, well-nourished, pleasant and cooperative in NAD Head:  Normocephalic and atraumatic. Eyes:  Sclera clear, no icterus.   Conjunctiva pink. Ears:  Normal auditory acuity. Nose:  No deformity, discharge,  or lesions. Mouth:  No deformity or lesions, dentition normal. Neck:  Supple; no masses or thyromegaly. Lungs:  Clear throughout to auscultation.   No wheezes, crackles, or rhonchi. No acute distress. Heart:  Regular rate, irregular rhythm, evidence of metallic click  Abdomen:  Soft, tender to palpation diffusely, non distended. No masses, hepatosplenomegaly or hernias noted. Normal bowel sounds, without guarding, and without rebound.   Msk:  Symmetrical without gross deformities. Normal posture. Pulses:  Normal pulses noted. Extremities:  Without clubbing or edema. Neurologic:  Alert and  oriented x4;  grossly normal neurologically. Skin:  Intact without significant lesions or rashes. Cervical Nodes:  No significant cervical adenopathy. Psych:  Alert and cooperative. Normal mood and affect.  Intake/Output from previous day: No intake/output data recorded. Intake/Output this shift: No intake/output data recorded.  Lab Results: Recent Labs    12/22/22 1310 12/24/22 0420  WBC 7.2 15.2*  HGB 9.8* 8.9*  HCT 30.4* 28.1*  PLT 150 169   BMET Recent Labs    12/22/22 1310 12/24/22 0420  NA 135 132*  K 4.1 3.5  CL 100 101  CO2 24 18*  GLUCOSE 171* 187*  BUN 89* 103*  CREATININE 2.60* 2.84*  CALCIUM 9.7 9.0   LFT Recent Labs    12/22/22 1310 12/24/22 0420  PROT 6.5 6.1*  ALBUMIN 4.1 3.4*  AST 28 32  ALT 15 15  ALKPHOS 152* 135*  BILITOT 0.9 1.2   PT/INR Recent Labs    12/22/22 1311 12/24/22 0420  LABPROT 41.8* 35.2*  INR 4.4* 3.5*   Hepatitis Panel No results for input(s): "HEPBSAG", "HCVAB", "HEPAIGM", "HEPBIGM" in the last 72 hours. C-Diff No results for input(s): "CDIFFTOX" in  the last 72 hours.  Studies/Results: CT ABDOMEN PELVIS WO CONTRAST  Result Date: 12/24/2022 CLINICAL DATA:  Sepsis. EXAM: CT ABDOMEN AND PELVIS WITHOUT CONTRAST TECHNIQUE: Multidetector CT imaging of the abdomen and pelvis was performed following the standard protocol without IV contrast. RADIATION DOSE REDUCTION: This exam was performed according to the departmental dose-optimization program which includes automated exposure control, adjustment of the mA and/or kV according to patient size and/or use of iterative reconstruction technique. COMPARISON:  08/26/2022 FINDINGS: Lower chest: There is a small left pleural effusion. Interlobular septal thickening identified within the lung bases. Calcified granuloma identified in the anterior left base. Hepatobiliary: No focal liver abnormality. Cholecystectomy. No bile duct dilatation. Pancreas: Unremarkable. No pancreatic ductal dilatation or surrounding inflammatory changes. Spleen: Normal in size without focal abnormality. Adrenals/Urinary Tract: Normal adrenal glands. Extensive bilateral renal vascular calcifications. No hydronephrosis or mass identified. Urinary bladder is unremarkable. Stomach/Bowel: Stomach appears unremarkable. There may be edema involving the second and third portions of the duodenum however this is limited due to lack of IV contrast material and paucity of enteric contrast material. No pathologic dilatation of  the large or small bowel loops to suggest obstruction. The colon is decompressed. There is mild diffuse colonic wall thickening identified. Sigmoid diverticulosis identified. No signs of pneumatosis. Vascular/Lymphatic: Extensive aortic atherosclerosis with branch vessel involvement. No abdominopelvic adenopathy. Reproductive: Status post hysterectomy. No adnexal masses. Other: Mild soft tissue stranding is identified within the pelvic fat. There is also a trace amount of fluid noted within the dependent portion of the pelvis. No focal  fluid collections identified. No signs of pneumoperitoneum. Musculoskeletal: Osteopenia. Severe right hip osteoarthritis. Scoliosis and multilevel degenerative disc disease. Unchanged mild superior endplate fracture deformity involving the L4 vertebra. IMPRESSION: 1. There is mild diffuse colonic wall thickening identified. Correlate for any clinical signs or symptoms of colitis. 2. There may be edema involving the second and third portions of the duodenum however this is limited due to lack of IV contrast material and paucity of enteric contrast material. Correlate for any clinical signs or symptoms of duodenitis. 3. Small left pleural effusion with interstitial edema noted within the lung bases. Findings are suggestive of mild CHF. 4. Sigmoid diverticulosis without evidence for acute diverticulitis. 5. Osteopenia with multilevel degenerative disc disease and right hip osteoarthritis. 6. Unchanged mild superior endplate fracture deformity involving the L4 vertebra. 7.  Aortic Atherosclerosis (ICD10-I70.0). Electronically Signed   By: Kerby Moors M.D.   On: 12/24/2022 08:52   DG Chest Port 1 View  Result Date: 12/24/2022 CLINICAL DATA:  Concern for sepsis. Nine moaning and abdominal pain/discomfort. EXAM: PORTABLE CHEST 1 VIEW COMPARISON:  08/31/2022 FINDINGS: Cardiac enlargement. Aortic atherosclerotic calcifications. Blunting of the left costophrenic angle. Mild increase interstitial markings compatible with interstitial edema. Bilateral calcified granulomas identified. Scar versus atelectasis noted in the left base. Bones appear diffusely osteopenic. Thoracolumbar scoliosis with degenerative disc disease. IMPRESSION: 1. Suspect mild CHF. 2. Cardiac enlargement and Aortic Atherosclerosis (ICD10-I70.0). Electronically Signed   By: Kerby Moors M.D.   On: 12/24/2022 05:53    Impression: *Colitis *?Duodenitis *Abdominal pain *Anemia *Coumadin coagulopathy  Plan: Discussed patient's CT findings in  depth with her and daughter Helene Kelp who is bedside.  Similar presentation in October 2023.  Agree with antibiotics for colitis.  Recommend 10-day total course.  Unclear if patient is having gross melena or not as she tells me her stools are "always dark" due to iron.  Denies any diarrhea.  Continue to monitor H&H.  IV Protonix 40 mg twice daily.  Offered upper endoscopy to further evaluate though patient would like to hold off and would prefer conservative management, which I think is a reasonable approach.  Clear liquid diet today.  Advance as tolerated.  Antiemetics and analgesia per hospitalist.  GI to continue to follow.  Elon Alas. Abbey Chatters, D.O. Gastroenterology and Hepatology [facility]    LOS: 0 days     12/24/2022, 12:18 PM

## 2022-12-24 NOTE — ED Notes (Signed)
Bladder scanner revealed over 370 mL of urine in bladder--MD, Vance Gather, made aware

## 2022-12-24 NOTE — ED Notes (Signed)
DNR band placed on pt  

## 2022-12-24 NOTE — Progress Notes (Signed)
Pt being followed by [facility] for Sepsis protocol. 

## 2022-12-24 NOTE — ED Provider Notes (Signed)
[name] Provider Note   CSN: [id number] Arrival date & time: 12/24/22  0448     History  Chief Complaint  Patient presents with   Abdominal Pain    [name] is a 82 y.o. female.  HPI     This is an 82 year old female who presents with concerns for bloody stools and not feeling well.  Son provides most of the history.  Patient is only moaning.  Son reports that she woke up this morning around 1 AM and was moaning.  She normally stays with his sister but he has been caring for her this week.  States she "said she did not feel good."  She is on blood thinners for A-fib.  She has an advanced directive and is DNR.  EMS reported nausea and dry heaves en route.  She was given Zofran and fluids.  She was also noted to have a dark tarry stool.  Level 5 caveat  Home Medications Prior to Admission medications   Medication Sig Start Date End Date Taking? Authorizing Provider  acetaminophen (TYLENOL) 325 MG tablet Take 325 mg by mouth every 6 (six) hours as needed for mild pain or fever.    [provider]  allopurinol (ZYLOPRIM) 100 MG tablet Take 100 mg by mouth daily.    [provider]  ALPRAZolam Duanne Moron) 0.25 MG tablet Take 1 tablet (0.25 mg total) by mouth 2 (two) times daily as needed for anxiety or sleep. 09/05/22   Murlean Iba, MD  amLODipine (NORVASC) 10 MG tablet TAKE 1 TABLET EVERY DAY 05/17/22   [name], MD  aspirin 81 MG chewable tablet 1 tablet Orally Once a day 06/29/22   [provider]  atorvastatin (LIPITOR) 80 MG tablet Take 1 tablet (80 mg total) by mouth daily at 6 PM. 11/22/18   [name], MD  Cholecalciferol (VITAMIN D3) 50 MCG (2000 UT) capsule Take 2,000 Units by mouth daily with lunch. 11/26/18   [provider]  Cyanocobalamin (VITAMIN B12) 1000 MCG TBCR Take 1,000 mcg by mouth daily with lunch.    [provider]  famotidine (PEPCID) 20 MG tablet  Take 20 mg by mouth 2 (two) times daily.    [provider]  feeding supplement, GLUCERNA SHAKE, (GLUCERNA SHAKE) LIQD Take 237 mLs by mouth 3 (three) times daily between meals. 09/05/22   Johnson, Clanford L, MD  ferrous sulfate 325 (65 FE) MG tablet Take 1 tablet (325 mg total) by mouth daily with breakfast. 09/06/22   Johnson, Clanford L, MD  furosemide (LASIX) 40 MG tablet TAKE 2 TABLETS IN MORNING AND 1 AND 1/2 TABLETS IN EVENING. CHANGE IN DOSAGE. 10/30/22   [name], MD  hydrALAZINE (APRESOLINE) 100 MG tablet TAKE 1 TABLET THREE TIMES DAILY 02/17/22   [name], MD  insulin aspart (NOVOLOG) 100 UNIT/ML injection Inject 2-8 Units into the skin 3 (three) times daily with meals. Per sliding scale, If blood sugar is 200 to 250, give 2 units. If blood sugar is 251 to 300, give 4 units. If blood sugar is 301 to 350, give 6 units. If blood sugar is greater than 350, give 8 units and call MD. 09/05/22   Murlean Iba, MD  Insulin Syringe-Needle U-100 (B-D INS SYR ULTRAFINE .3CC/29G) 29G X 1/2" 0.3 ML MISC Use for sliding scale for breakthrough high blood sugar readings 02/24/22   Benay Pike, MD  metoprolol succinate (TOPROL-XL) 25 MG 24  hr tablet TAKE 1/2 TABLET EVERY DAY 10/30/22   [name], MD  ondansetron (ZOFRAN-ODT) 4 MG disintegrating tablet Take 4 mg by mouth every 8 (eight) hours as needed for nausea or vomiting. 01/12/22   [provider]  pantoprazole (PROTONIX) 40 MG tablet Take 40 mg by mouth 2 (two) times daily. 06/25/22   [provider]  Sertraline HCl (ZOLOFT PO) Take 1 tablet by mouth daily.    [provider]  simethicone (GAS-X) 80 MG chewable tablet Chew 1 tablet (80 mg total) by mouth 4 (four) times daily as needed for flatulence. 04/16/22 04/16/23  Danford, Suann Larry, MD  tadalafil, PAH, (ADCIRCA) 20 MG tablet TAKE 2 TABLETS BY MOUTH ONCE DAILY 12/11/22   [name], MD  warfarin (COUMADIN) 5 MG tablet Take 1  tablet (5 mg total) by mouth See admin instructions. Take 1 tablet ever day except for on Sundays 1 and 1/2 tablets 09/05/22   Johnson, Clanford L, MD      Allergies    Flagyl [metronidazole], Augmentin [amoxicillin-pot clavulanate], Ciprofloxacin, Codeine, Coreg [carvedilol], Diflucan [fluconazole], Levofloxacin in d5w, Losartan, Privigen [immune globulin (human)], Sulfamethoxazole-trimethoprim, Verapamil, Zetia [ezetimibe], Zocor [simvastatin - high dose], and Gatifloxacin    Review of Systems   Review of Systems  Unable to perform ROS: Mental status change    Physical Exam Updated Vital Signs BP (!) 143/50   Pulse 91   Temp (!) 102.8 F (39.3 C) (Rectal)   Resp 18   Ht 1.473 m (4\' 10" )   Wt 52 kg   SpO2 94%   BMI 23.96 kg/m  Physical Exam Vitals and nursing note reviewed.  Constitutional:      Appearance: She is well-developed. She is ill-appearing. She is not toxic-appearing.     Comments: Moaning and uncomfortable appearing  HENT:     Head: Normocephalic and atraumatic.  Eyes:     Pupils: Pupils are equal, round, and reactive to light.  Cardiovascular:     Rate and Rhythm: Regular rhythm. Tachycardia present.     Heart sounds: Normal heart sounds.  Pulmonary:     Effort: Pulmonary effort is normal. No respiratory distress.     Breath sounds: No wheezing.  Abdominal:     General: Bowel sounds are normal.     Palpations: Abdomen is soft.     Tenderness: There is abdominal tenderness.     Comments: Midline ventral hernia noted  Musculoskeletal:     Cervical back: Neck supple.  Skin:    General: Skin is warm and dry.     Comments: Bruising noted bilateral upper extremities  Neurological:     Mental Status: She is alert and oriented to person, place, and time.     Comments: Follows simple commands, moves all 4 extremities equally     ED Results / Procedures / Treatments   Labs (all labs ordered are listed, but only abnormal results are displayed) Labs Reviewed   COMPREHENSIVE METABOLIC PANEL - Abnormal; Notable for the following components:      Result Value   Sodium 132 (*)    CO2 18 (*)    Glucose, Bld 187 (*)    BUN 103 (*)    Creatinine, Ser 2.84 (*)    Total Protein 6.1 (*)    Albumin 3.4 (*)    Alkaline Phosphatase 135 (*)    GFR, Estimated 16 (*)    All other components within normal limits  CBC - Abnormal; Notable for the following components:  WBC 15.2 (*)    RBC 2.86 (*)    Hemoglobin 8.9 (*)    HCT 28.1 (*)    RDW 17.1 (*)    All other components within normal limits  URINALYSIS, W/ REFLEX TO CULTURE (INFECTION SUSPECTED) - Abnormal; Notable for the following components:   Protein, ur 100 (*)    All other components within normal limits  PROTIME-INR - Abnormal; Notable for the following components:   Prothrombin Time 35.2 (*)    INR 3.5 (*)    All other components within normal limits  APTT - Abnormal; Notable for the following components:   aPTT 53 (*)    All other components within normal limits  POC OCCULT BLOOD, ED - Abnormal; Notable for the following components:   Fecal Occult Bld POSITIVE (*)    All other components within normal limits  RESP PANEL BY RT-PCR (RSV, FLU A&B, COVID)  [id number]  CULTURE, BLOOD (ROUTINE X 2)  CULTURE, BLOOD (ROUTINE X 2)  LIPASE, BLOOD  LACTIC ACID, PLASMA  LACTIC ACID, PLASMA  TYPE AND SCREEN    EKG EKG Interpretation  Date/Time:  Sunday December 24 2022 05:40:17 EDT Ventricular Rate:  94 PR Interval:    QRS Duration: 101 QT Interval:  298 QTC Calculation: 373 R Axis:   98 Text Interpretation: Atrial fibrillation Right axis deviation Borderline T abnormalities, inferior leads Confirmed by Thayer Jew [id number]) on 12/24/2022 6:34:55 AM  Radiology DG Chest Port 1 View  Result Date: 12/24/2022 CLINICAL DATA:  Concern for sepsis. Nine moaning and abdominal pain/discomfort. EXAM: PORTABLE CHEST 1 VIEW COMPARISON:  08/31/2022 FINDINGS: Cardiac enlargement. Aortic atherosclerotic  calcifications. Blunting of the left costophrenic angle. Mild increase interstitial markings compatible with interstitial edema. Bilateral calcified granulomas identified. Scar versus atelectasis noted in the left base. Bones appear diffusely osteopenic. Thoracolumbar scoliosis with degenerative disc disease. IMPRESSION: 1. Suspect mild CHF. 2. Cardiac enlargement and Aortic Atherosclerosis (ICD10-I70.0). Electronically Signed   By: Kerby Moors M.D.   On: 12/24/2022 05:53    Procedures .Critical Care  Performed by: Merryl Hacker, MD Authorized by: Merryl Hacker, MD   Critical care provider statement:    Critical care time (minutes):  31   Critical care was necessary to treat or prevent imminent or life-threatening deterioration of the following conditions:  Sepsis   Critical care was time spent personally by me on the following activities:  Development of treatment plan with patient or surrogate, discussions with consultants, evaluation of patient's response to treatment, examination of patient, ordering and review of laboratory studies, ordering and review of radiographic studies, ordering and performing treatments and interventions, pulse oximetry, re-evaluation of patient's condition and review of old charts     Medications Ordered in ED Medications  lactated ringers infusion (has no administration in time range)  acetaminophen (TYLENOL) tablet 1,000 mg (1,000 mg Oral Given 12/24/22 0537)  lactated ringers bolus 500 mL (500 mLs Intravenous New Bag/Given 12/24/22 0534)  ceFEPIme (MAXIPIME) 2 g in sodium chloride 0.9 % 100 mL IVPB (0 g Intravenous Stopped 12/24/22 0622)  metroNIDAZOLE (FLAGYL) IVPB 500 mg (0 mg Intravenous Stopped 12/24/22 [id number])    ED Course/ Medical Decision Making/ A&P                             Medical Decision Making Amount and/or Complexity of Data Reviewed Labs: ordered. Radiology: ordered. ECG/medicine tests: ordered.  Risk OTC drugs. Prescription  drug management.  This patient presents to the ED for concern of feeling sick, possible GI bleed, this involves an extensive number of treatment options, and is a complaint that carries with it a high risk of complications and morbidity.  I considered the following differential and admission for this acute, potentially life threatening condition.  The differential diagnosis includes sepsis secondary to intra-abdominal pathology, UTI, diverticulitis, viral infection such as COVID or influenza, pneumonia, acute GI bleed  MDM:    This is a an 82 year old female who presents after being found moaning at home.  Noted to have dark stool.  She did have dark stool upon arrival here into the emergency room which was Hemoccult positive.  Vital signs notable for temperature of 102.8.  She does not provide much history.  She is moaning on exam.  Son confirms DNR.  Sepsis workup was initiated.  She was given Tylenol for her fever.  She was given 500 cc of fluid.  Lactate is normal.  White count is 15.  Chest x-ray without obvious pneumonia and urinalysis without obvious UTI.  She does have some abdominal symptoms.  Will obtain CT abdomen pelvis to rule out intra-abdominal pathology.  She has been given broad-spectrum antibiotics.  Hemoglobin is 8.9.  This is slightly down from baseline.  However she is hemodynamically stable.  She has been typed and screened if hemoglobin were to continue to downtrend.  She is placed on IV Protonix for possible GI bleed.  (Labs, imaging, consults)  Labs: I Ordered, and personally interpreted labs.  The pertinent results include: CBC, CMP, lactate, urinalysis, blood cultures  Imaging Studies ordered: I ordered imaging studies including chest x-ray, CT abdomen pelvis pending I independently visualized and interpreted imaging. I agree with the radiologist interpretation  Additional history obtained from son at bedside.  External records from outside source obtained and reviewed  including prior evaluations  Cardiac Monitoring: The patient was maintained on a cardiac monitor.  If on the cardiac monitor, I personally viewed and interpreted the cardiac monitored which showed an underlying rhythm of: Atrial Fibrillation  Reevaluation: After the interventions noted above, I reevaluated the patient and found that they have :stayed the same  Social Determinants of Health:  lives with family  Disposition: Anticipate admission.  Signed out to oncoming provider.  Co morbidities that complicate the patient evaluation  Past Medical History:  Diagnosis Date   Aortic atherosclerosis ([place]) 01/23/2018   Atrial fibrillation ([facility])    Benign positional vertigo 10/06/2011   Carotid artery disease ([place])    Chemotherapy induced neutropenia ([place]) 07/21/2019   Cholelithiasis 01/23/2018   Cholelithiasis 01/23/2018   Chronic anticoagulation    Colovesical fistula, ruled out 04/14/2022   Coronary artery disease    status post coronary artery bypass grafting times 07/10/2004   Diabetes mellitus    Hypercholesteremia    Hypertension    Mechanical heart valve present    H. aortic valve replacement at the time of bypass surgery October 2005   Moderate to severe pulmonary hypertension ([place])    Peripheral arterial disease ([place])    history of left common iliac artery PTA and stenting for a chronic total occlusion 08/26/01   S/P cholecystectomy 03/06/2018     Medicines Meds ordered this encounter  Medications   acetaminophen (TYLENOL) tablet 1,000 mg   lactated ringers bolus 500 mL   lactated ringers infusion   ceFEPIme (MAXIPIME) 2 g in sodium chloride 0.9 % 100 mL IVPB    Order Specific Question:   Antibiotic  Indication:    Answer:   Other Indication (list below)    Order Specific Question:   Other Indication:    Answer:   Intra-abdominal   metroNIDAZOLE (FLAGYL) IVPB 500 mg    Order Specific Question:   Antibiotic Indication:    Answer:   Intra-abdominal Infection    I  have reviewed the patients home medicines and have made adjustments as needed  Problem List / ED Course: Problem List Items Addressed This Visit   None               Final Clinical Impression(s) / ED Diagnoses Final diagnoses:  None    Rx / DC Orders ED Discharge Orders     None         Marjani Kobel, Barbette Hair, MD 12/24/22 0700

## 2022-12-25 ENCOUNTER — Other Ambulatory Visit: Payer: Self-pay | Admitting: *Deleted

## 2022-12-25 DIAGNOSIS — K529 Noninfective gastroenteritis and colitis, unspecified: Secondary | ICD-10-CM | POA: Diagnosis not present

## 2022-12-25 DIAGNOSIS — D689 Coagulation defect, unspecified: Secondary | ICD-10-CM | POA: Diagnosis not present

## 2022-12-25 DIAGNOSIS — K922 Gastrointestinal hemorrhage, unspecified: Secondary | ICD-10-CM | POA: Diagnosis not present

## 2022-12-25 LAB — BASIC METABOLIC PANEL
Anion gap: 11 (ref 5–15)
BUN: 92 mg/dL — ABNORMAL HIGH (ref 8–23)
CO2: 19 mmol/L — ABNORMAL LOW (ref 22–32)
Calcium: 8.8 mg/dL — ABNORMAL LOW (ref 8.9–10.3)
Chloride: 105 mmol/L (ref 98–111)
Creatinine, Ser: 2.06 mg/dL — ABNORMAL HIGH (ref 0.44–1.00)
GFR, Estimated: 23 mL/min — ABNORMAL LOW (ref >60)
Glucose, Bld: 104 mg/dL — ABNORMAL HIGH (ref 70–99)
Potassium: 3.5 mmol/L (ref 3.5–5.1)
Sodium: 135 mmol/L (ref 135–145)

## 2022-12-25 LAB — TYPE AND SCREEN
ABO/RH(D): A POS
Antibody Screen: NEGATIVE
Unit division: 0

## 2022-12-25 LAB — BPAM RBC
Blood Product Expiration Date: 202404182359
ISSUE DATE / TIME: 202403171738
Unit Type and Rh: 6200

## 2022-12-25 LAB — GLUCOSE, CAPILLARY
Glucose-Capillary: 153 mg/dL — ABNORMAL HIGH (ref 70–99)
Glucose-Capillary: 184 mg/dL — ABNORMAL HIGH (ref 70–99)
Glucose-Capillary: 157 mg/dL — ABNORMAL HIGH (ref 70–99)
Glucose-Capillary: 208 mg/dL — ABNORMAL HIGH (ref 70–99)

## 2022-12-25 LAB — CBC
HCT: 29.2 % — ABNORMAL LOW (ref 36.0–46.0)
Hemoglobin: 9.2 g/dL — ABNORMAL LOW (ref 12.0–15.0)
MCH: 30.8 pg (ref 26.0–34.0)
MCHC: 31.5 g/dL (ref 30.0–36.0)
MCV: 97.7 fL (ref 80.0–100.0)
Platelets: 139 10*3/uL — ABNORMAL LOW (ref 150–400)
RBC: 2.99 MIL/uL — ABNORMAL LOW (ref 3.87–5.11)
RDW: 17.6 % — ABNORMAL HIGH (ref 11.5–15.5)
WBC: 9.9 10*3/uL (ref 4.0–10.5)
nRBC: 0 % (ref 0.0–0.2)

## 2022-12-25 LAB — HEMOGLOBIN A1C
Hgb A1c MFr Bld: 5.6 % (ref 4.8–5.6)
Mean Plasma Glucose: 114 mg/dL

## 2022-12-25 LAB — PROTIME-INR
INR: 3 — ABNORMAL HIGH (ref 0.8–1.2)
Prothrombin Time: 31.2 seconds — ABNORMAL HIGH (ref 11.4–15.2)

## 2022-12-25 LAB — HEMOGLOBIN AND HEMATOCRIT, BLOOD
HCT: 28.5 % — ABNORMAL LOW (ref 36.0–46.0)
Hemoglobin: 9.1 g/dL — ABNORMAL LOW (ref 12.0–15.0)

## 2022-12-25 MED ORDER — HYDRALAZINE HCL 25 MG PO TABS
50.0000 mg | ORAL_TABLET | Freq: Three times a day (TID) | ORAL | Status: DC
  Administered 2022-12-25: 50 mg via ORAL
  Filled 2022-12-25: qty 2

## 2022-12-25 MED ORDER — AMLODIPINE BESYLATE 5 MG PO TABS
10.0000 mg | ORAL_TABLET | Freq: Every day | ORAL | Status: DC
  Administered 2022-12-25 – 2022-12-31 (×7): 10 mg via ORAL
  Filled 2022-12-25 (×7): qty 2

## 2022-12-25 MED ORDER — GUAIFENESIN-DM 100-10 MG/5ML PO SYRP: 5.0000 mL | ORAL_SOLUTION | ORAL | Status: DC | PRN

## 2022-12-25 MED ORDER — METOPROLOL TARTRATE 5 MG/5ML IV SOLN
5.0000 mg | INTRAVENOUS | Status: AC
  Administered 2022-12-25: 5 mg via INTRAVENOUS
  Filled 2022-12-25: qty 5

## 2022-12-25 MED ORDER — PANTOPRAZOLE SODIUM 40 MG IV SOLR
INTRAVENOUS | Status: AC
  Filled 2022-12-25: qty 20

## 2022-12-25 MED ORDER — HYDRALAZINE HCL 20 MG/ML IJ SOLN
5.0000 mg | Freq: Four times a day (QID) | INTRAMUSCULAR | Status: DC | PRN
  Administered 2022-12-25 (×2): 5 mg via INTRAVENOUS
  Filled 2022-12-25 (×2): qty 1

## 2022-12-25 MED ORDER — PROCHLORPERAZINE EDISYLATE 10 MG/2ML IJ SOLN
5.0000 mg | Freq: Four times a day (QID) | INTRAMUSCULAR | Status: DC | PRN
  Administered 2022-12-25 – 2022-12-26 (×3): 5 mg via INTRAVENOUS
  Filled 2022-12-25 (×5): qty 2

## 2022-12-25 MED ORDER — ALPRAZOLAM 0.5 MG PO TABS
0.5000 mg | ORAL_TABLET | Freq: Every evening | ORAL | Status: DC | PRN
  Administered 2022-12-26 – 2022-12-29 (×4): 0.5 mg via ORAL
  Filled 2022-12-25 (×4): qty 1

## 2022-12-25 MED ORDER — HYDRALAZINE HCL 20 MG/ML IJ SOLN: 5.0000 mg | Freq: Four times a day (QID) | INTRAMUSCULAR | Status: DC | PRN

## 2022-12-25 MED ORDER — PANTOPRAZOLE SODIUM 40 MG IV SOLR: 40.0000 mg | Freq: Two times a day (BID) | INTRAVENOUS | Status: DC

## 2022-12-25 MED ORDER — POLYETHYLENE GLYCOL 3350 17 G PO PACK
17.0000 g | PACK | Freq: Every day | ORAL | Status: DC
  Administered 2022-12-25: 17 g via ORAL
  Filled 2022-12-25: qty 1

## 2022-12-25 MED ORDER — TRAMADOL HCL 50 MG PO TABS
25.0000 mg | ORAL_TABLET | Freq: Two times a day (BID) | ORAL | Status: DC | PRN
  Administered 2022-12-31: 25 mg via ORAL
  Filled 2022-12-25: qty 1

## 2022-12-25 MED ORDER — SERTRALINE HCL 50 MG PO TABS
25.0000 mg | ORAL_TABLET | Freq: Every day | ORAL | Status: DC
  Administered 2022-12-25 – 2022-12-31 (×7): 25 mg via ORAL
  Filled 2022-12-25 (×7): qty 1

## 2022-12-25 MED ORDER — HYDRALAZINE HCL 25 MG PO TABS
25.0000 mg | ORAL_TABLET | Freq: Once | ORAL | Status: AC
  Administered 2022-12-25: 25 mg via ORAL
  Filled 2022-12-25: qty 1

## 2022-12-25 MED ORDER — FENTANYL CITRATE PF 50 MCG/ML IJ SOSY
12.5000 ug | PREFILLED_SYRINGE | INTRAMUSCULAR | Status: DC | PRN
  Administered 2022-12-25 – 2022-12-26 (×3): 12.5 ug via INTRAVENOUS
  Filled 2022-12-25 (×3): qty 1

## 2022-12-25 MED ORDER — WARFARIN SODIUM 5 MG PO TABS
5.0000 mg | ORAL_TABLET | Freq: Once | ORAL | Status: AC
  Administered 2022-12-25: 5 mg via ORAL
  Filled 2022-12-25: qty 1

## 2022-12-25 MED ORDER — HYDRALAZINE HCL 20 MG/ML IJ SOLN
5.0000 mg | Freq: Four times a day (QID) | INTRAMUSCULAR | Status: DC | PRN
  Administered 2022-12-25 – 2022-12-29 (×8): 5 mg via INTRAVENOUS
  Filled 2022-12-25 (×9): qty 1

## 2022-12-25 MED ORDER — WARFARIN - PHARMACIST DOSING INPATIENT: Freq: Every day | Status: DC

## 2022-12-25 MED ORDER — HYDRALAZINE HCL 25 MG PO TABS
75.0000 mg | ORAL_TABLET | Freq: Three times a day (TID) | ORAL | Status: DC
  Filled 2022-12-25: qty 3

## 2022-12-25 MED ORDER — ACETAMINOPHEN 500 MG PO TABS
500.0000 mg | ORAL_TABLET | Freq: Once | ORAL | Status: AC
  Filled 2022-12-25: qty 1

## 2022-12-25 MED ORDER — PANTOPRAZOLE SODIUM 40 MG IV SOLR
40.0000 mg | Freq: Two times a day (BID) | INTRAVENOUS | Status: DC
  Administered 2022-12-25: 40 mg via INTRAVENOUS
  Filled 2022-12-25: qty 10

## 2022-12-25 NOTE — Progress Notes (Signed)
Gastroenterology Progress Note     Primary Gastroenterologist:  [facility]  Patient ID: [name]; [id number]; November 23, 1940    Subjective   Vomited after breakfast this morning. Feels she needs to have a BM. Feels this would help her feel better. Intermittent nausea. Declining any procedures. Takes Miralax at home and requesting this. [place] stool scale #2 (lumpy) documented yesterday morning.    Objective   Vital signs in last 24 hours Temp:  [97.8 F (36.6 C)-100.3 F (37.9 C)] 99.4 F (37.4 C) (03/18 1115) Pulse Rate:  [61-100] 88 (03/18 1115) Resp:  [16-32] 22 (03/18 1115) BP: (125-215)/(49-98) 195/54 (03/18 1100) SpO2:  [92 %-100 %] 98 % (03/18 1115) Weight:  [49.8 kg] 49.8 kg (03/18 0516) Last BM Date : 12/24/22  Physical Exam General:   Alert and oriented, pleasant Head:  Normocephalic and atraumatic. Eyes:  No icterus, sclera clear. Conjuctiva pink.  Mouth:  Without lesions, mucosa pink and moist.  Neck:  Supple, without thyromegaly or masses.  Heart:  S1, S2 present, no murmurs noted.  Lungs: Clear to auscultation bilaterally, without wheezing, rales, or rhonchi.  Abdomen:  Bowel sounds present, soft, non-tender, non-distended.  Msk:  Symmetrical without gross deformities. Normal posture. Pulses:  Normal pulses noted. Extremities:  Without clubbing or edema. Neurologic:  Alert and  oriented x4;  grossly normal neurologically. Skin:  Warm and dry, intact without significant lesions.  Cervical Nodes:  No significant cervical adenopathy. Psych:  Alert and cooperative. Normal mood and affect.  Intake/Output from previous day: 03/17 0701 - 03/18 0700 In: 1028.8 [P.O.:240; I.V.:273.8; Blood:315; IV Piggyback:200] Out: 3200 [Urine:3200] Intake/Output this shift: No intake/output data recorded.  Lab Results  Recent Labs    12/22/22 1310 12/24/22 0420 12/24/22 1315 12/24/22 2329 12/25/22 0314 12/25/22 0716  WBC 7.2 15.2*  --   --  9.9  --   HGB 9.8*  8.9*   < > 8.6* 9.2* 9.1*  HCT 30.4* 28.1*   < > 26.6* 29.2* 28.5*  PLT 150 169  --   --  139*  --    < > = values in this interval not displayed.   BMET Recent Labs    12/24/22 0420 12/24/22 1525 12/25/22 0314  NA 132* 133* 135  K 3.5 3.9 3.5  CL 101 104 105  CO2 18* 18* 19*  GLUCOSE 187* 143* 104*  BUN 103* 97* 92*  CREATININE 2.84* 2.40* 2.06*  CALCIUM 9.0 8.6* 8.8*   LFT Recent Labs    12/22/22 1310 12/24/22 0420  PROT 6.5 6.1*  ALBUMIN 4.1 3.4*  AST 28 32  ALT 15 15  ALKPHOS 152* 135*  BILITOT 0.9 1.2   PT/INR Recent Labs    12/24/22 0420 12/25/22 1035  LABPROT 35.2* 31.2*  INR 3.5* 3.0*     Studies/Results CT ABDOMEN PELVIS WO CONTRAST  Result Date: 12/24/2022 CLINICAL DATA:  Sepsis. EXAM: CT ABDOMEN AND PELVIS WITHOUT CONTRAST TECHNIQUE: Multidetector CT imaging of the abdomen and pelvis was performed following the standard protocol without IV contrast. RADIATION DOSE REDUCTION: This exam was performed according to the departmental dose-optimization program which includes automated exposure control, adjustment of the mA and/or kV according to patient size and/or use of iterative reconstruction technique. COMPARISON:  08/26/2022 FINDINGS: Lower chest: There is a small left pleural effusion. Interlobular septal thickening identified within the lung bases. Calcified granuloma identified in the anterior left base. Hepatobiliary: No focal liver abnormality. Cholecystectomy. No bile duct dilatation. Pancreas: Unremarkable. No pancreatic ductal  dilatation or surrounding inflammatory changes. Spleen: Normal in size without focal abnormality. Adrenals/Urinary Tract: Normal adrenal glands. Extensive bilateral renal vascular calcifications. No hydronephrosis or mass identified. Urinary bladder is unremarkable. Stomach/Bowel: Stomach appears unremarkable. There may be edema involving the second and third portions of the duodenum however this is limited due to lack of IV  contrast material and paucity of enteric contrast material. No pathologic dilatation of the large or small bowel loops to suggest obstruction. The colon is decompressed. There is mild diffuse colonic wall thickening identified. Sigmoid diverticulosis identified. No signs of pneumatosis. Vascular/Lymphatic: Extensive aortic atherosclerosis with branch vessel involvement. No abdominopelvic adenopathy. Reproductive: Status post hysterectomy. No adnexal masses. Other: Mild soft tissue stranding is identified within the pelvic fat. There is also a trace amount of fluid noted within the dependent portion of the pelvis. No focal fluid collections identified. No signs of pneumoperitoneum. Musculoskeletal: Osteopenia. Severe right hip osteoarthritis. Scoliosis and multilevel degenerative disc disease. Unchanged mild superior endplate fracture deformity involving the L4 vertebra. IMPRESSION: 1. There is mild diffuse colonic wall thickening identified. Correlate for any clinical signs or symptoms of colitis. 2. There may be edema involving the second and third portions of the duodenum however this is limited due to lack of IV contrast material and paucity of enteric contrast material. Correlate for any clinical signs or symptoms of duodenitis. 3. Small left pleural effusion with interstitial edema noted within the lung bases. Findings are suggestive of mild CHF. 4. Sigmoid diverticulosis without evidence for acute diverticulitis. 5. Osteopenia with multilevel degenerative disc disease and right hip osteoarthritis. 6. Unchanged mild superior endplate fracture deformity involving the L4 vertebra. 7.  Aortic Atherosclerosis (ICD10-I70.0). Electronically Signed   By: Kerby Moors M.D.   On: 12/24/2022 08:52   DG Chest Port 1 View  Result Date: 12/24/2022 CLINICAL DATA:  Concern for sepsis. Nine moaning and abdominal pain/discomfort. EXAM: PORTABLE CHEST 1 VIEW COMPARISON:  08/31/2022 FINDINGS: Cardiac enlargement. Aortic  atherosclerotic calcifications. Blunting of the left costophrenic angle. Mild increase interstitial markings compatible with interstitial edema. Bilateral calcified granulomas identified. Scar versus atelectasis noted in the left base. Bones appear diffusely osteopenic. Thoracolumbar scoliosis with degenerative disc disease. IMPRESSION: 1. Suspect mild CHF. 2. Cardiac enlargement and Aortic Atherosclerosis (ICD10-I70.0). Electronically Signed   By: Kerby Moors M.D.   On: 12/24/2022 05:53   VAS US CAROTID  Result Date: 12/19/2022 Carotid Arterial Duplex Study Patient Name:  MARIYAH ZELLMAN  Date of Exam:   12/19/2022 Medical Rec #: [id number]       Accession #:    [id number] Date of Birth: July 13, 1941       Patient Gender: F Patient Age:   82 years Exam Location:  [facility] Procedure:      VAS US CAROTID Referring Phys: Monica Martinez --------------------------------------------------------------------------------  Indications:       Bilateral carotid artery stenosis. Risk Factors:      Hypertension, hyperlipidemia, Diabetes, coronary artery                    disease, PAD. Comparison Study:  prior 03/07/22 RT ICA 40-59% stenosis LT ICA 60-79% stenosis Performing Technologist: Archie Patten RVS  Examination Guidelines: A complete evaluation includes B-mode imaging, spectral Doppler, color Doppler, and power Doppler as needed of all accessible portions of each vessel. Bilateral testing is considered an integral part of a complete examination. Limited examinations for reoccurring indications may be performed as noted.  Right Carotid Findings: +----------+--------+--------+--------+------------------+---------------------+  PSV cm/sEDV cm/sStenosisPlaque DescriptionComments              +----------+--------+--------+--------+------------------+---------------------+ CCA Prox  100     13              heterogenous                             +----------+--------+--------+--------+------------------+---------------------+ CCA Distal88      16              heterogenous                            +----------+--------+--------+--------+------------------+---------------------+ ICA Prox  281     46      40-59%  calcific          Velocities may                                                            underestimate degree                                                      of stenosis due to                                                        more proximal                                                             obstruction.          +----------+--------+--------+--------+------------------+---------------------+ ICA Mid   100     21                                                      +----------+--------+--------+--------+------------------+---------------------+ ICA Distal117     17                                                      +----------+--------+--------+--------+------------------+---------------------+ ECA       724             >50%                                            +----------+--------+--------+--------+------------------+---------------------+ +----------+--------+-------+--------+-------------------+           PSV cm/sEDV cmsDescribeArm Pressure (mmHG) +----------+--------+-------+--------+-------------------+  Subclavian139                                        +----------+--------+-------+--------+-------------------+ +---------+--------+--+--------+--+---------+ VertebralPSV cm/s53EDV cm/s11Antegrade +---------+--------+--+--------+--+---------+  Left Carotid Findings: +----------+--------+--------+--------+------------------+--------+           PSV cm/sEDV cm/sStenosisPlaque DescriptionComments +----------+--------+--------+--------+------------------+--------+ CCA Prox  91      16              heterogenous                +----------+--------+--------+--------+------------------+--------+ CCA Distal160     26              heterogenous               +----------+--------+--------+--------+------------------+--------+ ICA Prox  503     78      60-79%  calcific                   +----------+--------+--------+--------+------------------+--------+ ICA Mid   132     27                                         +----------+--------+--------+--------+------------------+--------+ ICA Distal117     26                                         +----------+--------+--------+--------+------------------+--------+ ECA       144                                                +----------+--------+--------+--------+------------------+--------+ +----------+--------+--------+--------+-------------------+           PSV cm/sEDV cm/sDescribeArm Pressure (mmHG) +----------+--------+--------+--------+-------------------+ [id number]                                         +----------+--------+--------+--------+-------------------+ +---------+--------+--+--------+---------+ VertebralPSV cm/s33EDV cm/sAntegrade +---------+--------+--+--------+---------+   Summary: Right Carotid: Velocities in the right ICA are consistent with a 40-59%                stenosis. The ECA appears >50% stenosed. Left Carotid: Velocities in the left ICA are consistent with a 60-79% stenosis. Vertebrals: Bilateral vertebral arteries demonstrate antegrade flow. *See table(s) above for measurements and observations.  Electronically signed by Monica Martinez MD on 12/19/2022 at 69:14:03 PM.    Final     Assessment  82 y.o. female with a history of  mechanical aortic valve chronically on Coumadin, ANCA vasculitis, CKD, atrial fibrillation, type 2 diabetes, anemia of chronic disease, who presented to [facility], ER due to abdominal pain, nausea. Similar presentation in Oct 2023.   CT without contrast revealing mild diffuse colonic  wall thickening. Possible edema involving second and third portions of duodenum but limited by lack of IV contrast. Other findings as above.   Hgb down to 7.1 yesterday, heme positive, receiving 1 unit PRBCs. Hgb 9.1 this morning. No overt GI bleeding.   Intermittent nausea this morning and vomited after breakfast. Feels constipated. She is requesting to resume home Miralax and feels this would  be helpful.  She is declining any endoscopic evaluation. Will provide supportive measures.  As no endoscopic evaluation planned and patient declining, may resume Coumadin.    Plan / Recommendations  Resume Coumadin Miralax daily Can change to PPI IV BID Consider palliative consultation    LOS: 1 day    12/25/2022, 11:37 AM  Annitta Needs, PhD, ANP-BC [facility] Gastroenterology

## 2022-12-25 NOTE — TOC Initial Note (Signed)
Transition of Care [facility]) - Initial/Assessment Note    Patient Details  Name: [name] MRN: [id number] Date of Birth: 1940-10-19  [facility]) CM/SW Contact:    Iona Beard, [place] Phone Number: 12/25/2022, 9:37 AM  Clinical Narrative:                 Pt is high risk for readmission. CSW spoke with pts daughter [name] to complete assessment. [name] states that pt lives with her most of the time and pt will stay with her son if [name] is out of town. Pt is mostly independent in completing her ADLs but has assistance when needed. Pts daughter [name] provides all transportation that is needed. Pt is active with HH through [place] at this time. Pt has a cane, walker, wheelchair and 3N1 to use when needed. TOC to follow.   Expected Discharge Plan: [place] Barriers to Discharge: Continued Medical Work up   Patient Goals and CMS Choice Patient states their goals for this hospitalization and ongoing recovery are:: return home CMS Medicare.gov Compare Post Acute Care list provided to:: Patient Choice offered to / list presented to : Patient      Expected Discharge Plan and Services In-house Referral: Clinical Social Work Discharge Planning Services: CM Consult Post Acute Care Choice: [place] arrangements for the past 2 months: [place]                                      Prior Living Arrangements/Services Living arrangements for the past 2 months: Single Family Home Lives with:: Adult Children Patient language and need for interpreter reviewed:: Yes Do you feel safe going back to the place where you live?: Yes      Need for Family Participation in Patient Care: Yes (Comment) Care giver support system in place?: Yes (comment) Current home services: DME Criminal Activity/Legal Involvement Pertinent to Current Situation/Hospitalization: No - Comment as needed  Activities of Daily Living      Permission  Sought/Granted                  Emotional Assessment         Alcohol / Substance Use: Not Applicable Psych Involvement: No (comment)  Admission diagnosis:  Melena [K92.1] Coagulopathy ([place]) [D68.9] Acute GI bleeding [K92.2] Severe sepsis ([place]) [A41.9, R65.20] Acute on chronic congestive heart failure, unspecified heart failure type ([place]) [I50.9] Patient Active Problem List   Diagnosis Date Noted   Acute GI bleeding 12/24/2022   Coagulopathy ([place]) 12/24/2022   Melena 12/24/2022   Duodenitis 12/24/2022   Noninfectious gastroenteritis 12/24/2022   CAD, multiple vessel 12/24/2022   Acute on chronic heart failure with preserved ejection fraction (HFpEF) ([place]) 08/29/2022   Acute exacerbation of CHF (congestive heart failure) ([place]) 08/29/2022   Acute urinary retention 08/28/2022   Acute kidney injury superimposed on chronic kidney disease ([place]) 08/27/2022   Constipation 08/27/2022   Nausea 08/27/2022   Hypoalbuminemia due to protein-calorie malnutrition ([place]) 08/27/2022   Type 2 diabetes mellitus with hyperglycemia ([place]) 08/27/2022   Palliative care encounter    Goals of care, counseling/discussion    Encounter for therapeutic drug monitoring    GI bleed 07/09/2022   GI bleeding 07/08/2022   Acute on chronic anemia 07/08/2022   History of Clostridioides difficile colitis 07/08/2022   DNR (do not resuscitate)/DNI(Do Not Intubate) 07/08/2022   Anemia of  chronic disease 07/07/2022   Pressure injury of skin 06/23/2022   UTI (urinary tract infection) 06/22/2022   Joint pain 06/09/2022   Parietoalveolar pneumopathy ([place]) 06/09/2022   Primary osteoarthritis 06/09/2022   Rheumatoid factor positive 06/09/2022   C. difficile colitis 04/19/2022   Colitis 04/18/2022   PAH (pulmonary artery hypertension) ([place]) 04/18/2022   Emphysematous cystitis 04/13/2022   Female bladder prolapse 04/13/2022   Diverticulitis 04/12/2022   Autoimmune hemolytic anemia ([place]) 01/02/2022   Bilateral  lower extremity edema 01/02/2022   Refractory anemia (HCC) 12/15/2021   Abnormal chest x-ray 12/15/2021   Supratherapeutic INR 12/15/2021   Elevated troponin 12/15/2021   Unilateral primary osteoarthritis, right hip 05/31/2021   Midline cystocele 05/30/2021   Primary localized osteoarthritis of pelvic region and thigh 05/30/2021   Vaginal pain 05/30/2021   Asymmetric SNHL (sensorineural hearing loss) 01/06/2021   Referred otalgia of both ears 01/06/2021   Bilateral hearing loss 08/31/2020   Low back pain 02/20/2020   CKD (chronic kidney disease), stage IV (HCC) 10/13/2019   Leukopenia due to antineoplastic chemotherapy ([place]) 08/18/2019   Thrombophilia ([place]) 06/19/2019   Encounter for general adult medical examination without abnormal findings 06/13/2019   Arteritis ([place]) 06/06/2019   Hypertensive heart and chronic kidney disease with heart failure and stage 1 through stage 4 chronic kidney disease, or unspecified chronic kidney disease ([place]) 06/06/2019   Muscle weakness 06/06/2019   Pancytopenia ([place]) 05/28/2019   Dyspnea 05/28/2019   Vasculitis, ANCA positive 05/28/2019   Disorder of connective tissue ([place]) 05/14/2019   Renal insufficiency 05/08/2019   PAF (paroxysmal atrial fibrillation) ([place]) 04/07/2019   Iron deficiency anemia 02/28/2019   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 ([place]) 02/28/2019   Diabetic retinopathy associated with type 2 diabetes mellitus ([place]) 09/02/2018   Chronic pain 06/19/2018   Non-thrombocytopenic purpura ([place]) 05/21/2018   Gout 03/22/2018   Malnutrition of moderate degree 03/12/2018   Diabetes mellitus type 2, controlled ([place]) 03/06/2018   Chronic atrial fibrillation 03/06/2018   H/O mechanical aortic valve replacement 03/06/2018   Moderate to severe pulmonary hypertension ([place]) 03/06/2018   GERD without esophagitis 02/21/2018   Anxiety 02/21/2018   Chronic diastolic CHF (congestive heart failure) ([place]) 02/21/2018   General  weakness 02/02/2018   Thrombocytopenia ([place]) 01/25/2018   Arm pain, diffuse 01/25/2018   Anemia 01/23/2018   Aortic atherosclerosis ([place]) 01/23/2018   Age-related osteoporosis without current pathological fracture 05/24/2017   Carotid artery occlusion 05/24/2017   Generalized anxiety disorder 05/24/2017   Hearing loss 05/24/2017   Presence of prosthetic heart valve 05/24/2017   Carotid artery disease ([place]) 08/18/2013   Peripheral arterial disease ([place]) 08/18/2013   Diabetes mellitus without complication ([place]) 0000000   Essential hypertension    Hypercholesteremia    Mechanical heart valve present    Chronic anticoagulation    PCP:  [name], MD Pharmacy:   [facility], [place] [address] Phone: [phone] Fax: [phone]  [facility] - [place], [place] - [address] [place] [address] Phone: [phone] Fax: [phone]  [place], [place] [address] Phone: [phone] Fax: [phone]     Social Determinants of Health (SDOH) Social History: SDOH Screenings   Food Insecurity: No Food Insecurity (08/31/2022)  Housing: Low Risk  (08/31/2022)  Transportation Needs: No Transportation Needs (08/31/2022)  Utilities: Not At Risk (08/31/2022)  Alcohol Screen: Low Risk  (02/13/2022)  Depression (PHQ2-9): Low Risk  (02/13/2022)  Financial Resource Strain: Low Risk  (02/28/2022)  Physical Activity: Inactive (02/28/2022)  Social Connections: Moderately Integrated (06/22/2022)  Stress: No Stress Concern Present (02/28/2022)  Tobacco Use: Medium Risk (12/24/2022)   SDOH Interventions:     Readmission Risk Interventions    12/25/2022    9:36 AM 08/30/2022   11:42 AM 04/20/2022   12:02 PM  Readmission Risk Prevention Plan  Transportation  Screening Complete Complete Complete  Medication Review Press photographer) Complete Complete Complete  PCP or Specialist appointment within 3-5 days of discharge   Complete  HRI or [place] Complete Complete Complete  SW Recovery Care/Counseling Consult Complete Complete Complete  Palliative Care Screening Not Applicable Not Applicable Not [place] Not Applicable Not Applicable Not Applicable

## 2022-12-25 NOTE — Progress Notes (Addendum)
PROGRESS NOTE    [name]  [id number] DOB: [birth year] DOA: 12/24/2022 PCP: [name], MD   Brief Narrative: 82 year old with past medical history significant for A-fib, CAD status post CABG, mechanical AVR on Coumadin, chronic anemia of CKD and iron deficiency, ANCA vasculitis not currently on treatment, PAD status post stenting, pulmonary hypertension, insulin-dependent diabetes type 2, hypertension, hyperlipidemia, urinary retention who presented to ED complaining of sudden onset abdominal discomfort, she had the day prior 4-5 soft stool.  She reported dark stool as usual she is on chronic iron supplements.  Evaluation in the ED she was noted to be febrile temperature 102, white blood cell 15, hemoglobin 7, INR 3.5 creatinine 2.8 CT abdomen demonstrated wall thickening involving the entire colon and duodenal.  Interstitial pulmonary edema.  Recent admission for similar presentation and underwent endoscopy which was negative for ulcers.  Also prior admission for heart failure exacerbation.    Assessment & Plan:   Principal Problem:   Acute GI bleeding Active Problems:   H/O mechanical aortic valve replacement   Acute on chronic anemia   Acute urinary retention   Chronic diastolic CHF (congestive heart failure) (HCC)   Diabetes mellitus type 2, controlled (HCC)   PAF (paroxysmal atrial fibrillation) (HCC)   Vasculitis, ANCA positive   CKD (chronic kidney disease), stage IV (HCC)   History of Clostridioides difficile colitis   DNR (do not resuscitate)/DNI(Do Not Intubate)   Essential hypertension   Hypercholesteremia   Carotid artery disease (HCC)   Peripheral arterial disease (HCC)   Aortic atherosclerosis (HCC)   GERD without esophagitis   Chronic atrial fibrillation   Moderate to severe pulmonary hypertension (HCC)   Iron deficiency anemia   Diabetic retinopathy associated with type 2 diabetes mellitus (HCC)   Colitis   Hypoalbuminemia due to  protein-calorie malnutrition (HCC)   Coagulopathy (HCC)   Melena   Duodenitis   Noninfectious gastroenteritis   CAD, multiple vessel   1-Anemia, concern for GI bleed, acute blood loss anemia on chronic -Continue with IV PPI -GI following -Received 1 unit of packed red blood cell.  Hemoglobin increased to 9 -Hb  is stable today at 9.2 -Coumadin/ when okay by GI   2-Sepsis, probably secondary to Colitis -Prior history of C. difficile.  GI pathogen order and C. difficile ordered -CT abdomen pelvis: mild diffuse colonic wall thickening identified, There may be edema involving the second and third portions of the duodenum however this is limited due to lack of IV contrast material -Continue with IV ceftriaxone and Flagyl -Added low-dose IV fentanyl to help with pain  3-Mechanical AVR, lifelong Coumadin -INR 3.0. -Resume Coumadin when okay by GI  4-AKI on stage IV CKD: Prior Cr: 2.2--2.3 Cr on admission 2.8 -Monitor avoid nephrotoxins  Acute on chronic urinary retention: Retaining 300 cc of urine on admission.  Foley catheter placed  Chronic heart failure preserved ejection fraction: Received IV Lasix with transfusion Patient vomiting, continue to hold diuretics for now  CAD status post CABG, chronic A-fib, PAD, hypertension: Resume Norvasc and hydralazine, blood pressure increasing  Pulmonary hypertension: Holding tadalafil due to creatinine clearance less than 30  Depression: resume Zoloft.   Insulin-dependent type 2: Continue with a sliding scale insulin  History of ANCA vasculitis: No longer on rituximab or prednisone.      Pressure Injury 06/22/22 Sacrum Medial;Bilateral Stage 1 -  Intact skin with non-blanchable redness of a localized area usually over a bony prominence. (Active)  06/22/22 1521  Location: Sacrum  Location Orientation: Medial;Bilateral  Staging: Stage 1 -  Intact skin with non-blanchable redness of a localized area usually over a bony prominence.   Wound Description (Comments):   Present on Admission:       Estimated body mass index is 22.95 kg/m as calculated from the following:   Height as of this encounter: 4\' 10"  (1.473 m).   Weight as of this encounter: 49.8 kg.   DVT prophylaxis: SCD Code Status: DNR Family Communication: Daughter updated over phone Disposition Plan:  Status is: Inpatient Remains inpatient appropriate because: management of colitis. GI bleed    Consultants:  GI  Procedures:  None  Antimicrobials:    Subjective: She is alert,report back pain and abdominal pain. No Bm She vomited today. No BM since Friday.  No bloody stool reported.   Objective: Vitals:   12/25/22 1100 12/25/22 1115 12/25/22 1200 12/25/22 1300  BP: (!) 195/54  (!) 165/39 (!) 176/50  Pulse: 100 88 93 91  Resp: (!) 22 (!) 22 (!) 21 (!) 22  Temp:  99.4 F (37.4 C)    TempSrc:  Oral    SpO2: 98% 98% 95% 97%  Weight:      Height:        Intake/Output Summary (Last 24 hours) at 12/25/2022 1503 Last data filed at 12/25/2022 1301 Gross per 24 hour  Intake 1439 ml  Output 3400 ml  Net -1961 ml   Filed Weights   12/24/22 0453 12/25/22 0516  Weight: 52 kg 49.8 kg    Examination:  General exam: Appears calm and comfortable  Respiratory system: Clear to auscultation. Respiratory effort normal. Cardiovascular system: S1 & S2 heard, RRR. No JVD, murmurs, rubs, gallops or clicks. No pedal edema. Gastrointestinal system: Abdomen is nondistended, soft and nontender. Central nervous system: Alert and oriented.  Extremities: Symmetric 5 x 5 power.   Data Reviewed: I have personally reviewed following labs and imaging studies  CBC: Recent Labs  Lab 12/22/22 1310 12/24/22 0420 12/24/22 1315 12/24/22 1525 12/24/22 2329 12/25/22 0314 12/25/22 0716  WBC 7.2 15.2*  --   --   --  9.9  --   NEUTROABS 4.5  --   --   --   --   --   --   HGB 9.8* 8.9* 7.1* 7.5* 8.6* 9.2* 9.1*  HCT 30.4* 28.1* 22.4* 23.4* 26.6* 29.2*  28.5*  MCV 97.1 98.3  --   --   --  97.7  --   PLT 150 169  --   --   --  139*  --    Basic Metabolic Panel: Recent Labs  Lab 12/22/22 1310 12/24/22 0420 12/24/22 1525 12/25/22 0314  NA 135 132* 133* 135  K 4.1 3.5 3.9 3.5  CL 100 101 104 105  CO2 24 18* 18* 19*  GLUCOSE 171* 187* 143* 104*  BUN 89* 103* 97* 92*  CREATININE 2.60* 2.84* 2.40* 2.06*  CALCIUM 9.7 9.0 8.6* 8.8*   GFR: Estimated Creatinine Clearance: 14.5 mL/min (A) (by C-G formula based on SCr of 2.06 mg/dL (H)). Liver Function Tests: Recent Labs  Lab 12/22/22 1310 12/24/22 0420  AST 28 32  ALT 15 15  ALKPHOS 152* 135*  BILITOT 0.9 1.2  PROT 6.5 6.1*  ALBUMIN 4.1 3.4*   Recent Labs  Lab 12/24/22 0420  LIPASE 39   No results for input(s): "AMMONIA" in the last 168 hours. Coagulation Profile: Recent Labs  Lab 12/22/22 1311 12/24/22 0420 12/25/22 1035  INR 4.4* 3.5* 3.0*  Cardiac Enzymes: No results for input(s): "CKTOTAL", "CKMB", "CKMBINDEX", "TROPONINI" in the last 168 hours. BNP (last 3 results) No results for input(s): "PROBNP" in the last 8760 hours. HbA1C: No results for input(s): "HGBA1C" in the last 72 hours. CBG: Recent Labs  Lab 12/24/22 1404 12/24/22 1620 12/24/22 2036 12/25/22 0859 12/25/22 1112  GLUCAP 150* 131* 160* 153* 184*   Lipid Profile: No results for input(s): "CHOL", "HDL", "LDLCALC", "TRIG", "CHOLHDL", "LDLDIRECT" in the last 72 hours. Thyroid Function Tests: No results for input(s): "TSH", "T4TOTAL", "FREET4", "T3FREE", "THYROIDAB" in the last 72 hours. Anemia Panel: No results for input(s): "VITAMINB12", "FOLATE", "FERRITIN", "TIBC", "IRON", "RETICCTPCT" in the last 72 hours. Sepsis Labs: Recent Labs  Lab 12/24/22 0420 12/24/22 0735  LATICACIDVEN 1.9 1.0    Recent Results (from the past 240 hour(s))  Resp panel by RT-PCR (RSV, Flu A&B, Covid) Anterior Nasal Swab     Status: None   Collection Time: 12/24/22  5:11 AM   Specimen: Anterior Nasal Swab   Result Value Ref Range Status   SARS Coronavirus 2 by RT PCR NEGATIVE NEGATIVE Final    Comment: (NOTE) SARS-CoV-2 target nucleic acids are NOT DETECTED.  The SARS-CoV-2 RNA is generally detectable in upper respiratory specimens during the acute phase of infection. The lowest concentration of SARS-CoV-2 viral copies this assay can detect is 138 copies/mL. A negative result does not preclude SARS-Cov-2 infection and should not be used as the sole basis for treatment or other patient management decisions. A negative result may occur with  improper specimen collection/handling, submission of specimen other than nasopharyngeal swab, presence of viral mutation(s) within the areas targeted by this assay, and inadequate number of viral copies(<138 copies/mL). A negative result must be combined with clinical observations, patient history, and epidemiological information. The expected result is Negative.  Fact Sheet for Patients:  EntrepreneurPulse.com.au  Fact Sheet for Healthcare Providers:  IncredibleEmployment.be  This test is no t yet approved or cleared by the [place] FDA and  has been authorized for detection and/or diagnosis of SARS-CoV-2 by FDA under an Emergency Use Authorization (EUA). This EUA will remain  in effect (meaning this test can be used) for the duration of the COVID-19 declaration under Section 564(b)(1) of the Act, 21 U.S.C.section 360bbb-3(b)(1), unless the authorization is terminated  or revoked sooner.       Influenza A by PCR NEGATIVE NEGATIVE Final   Influenza B by PCR NEGATIVE NEGATIVE Final    Comment: (NOTE) The Xpert Xpress SARS-CoV-2/FLU/RSV plus assay is intended as an aid in the diagnosis of influenza from Nasopharyngeal swab specimens and should not be used as a sole basis for treatment. Nasal washings and aspirates are unacceptable for Xpert Xpress SARS-CoV-2/FLU/RSV testing.  Fact Sheet for  Patients: EntrepreneurPulse.com.au  Fact Sheet for Healthcare Providers: IncredibleEmployment.be  This test is not yet approved or cleared by the [place] FDA and has been authorized for detection and/or diagnosis of SARS-CoV-2 by FDA under an Emergency Use Authorization (EUA). This EUA will remain in effect (meaning this test can be used) for the duration of the COVID-19 declaration under Section 564(b)(1) of the Act, 21 U.S.C. section 360bbb-3(b)(1), unless the authorization is terminated or revoked.     Resp Syncytial Virus by PCR NEGATIVE NEGATIVE Final    Comment: (NOTE) Fact Sheet for Patients: EntrepreneurPulse.com.au  Fact Sheet for Healthcare Providers: IncredibleEmployment.be  This test is not yet approved or cleared by the [place] FDA and has been authorized for detection and/or diagnosis of  SARS-CoV-2 by FDA under an Emergency Use Authorization (EUA). This EUA will remain in effect (meaning this test can be used) for the duration of the COVID-19 declaration under Section 564(b)(1) of the Act, 21 U.S.C. section 360bbb-3(b)(1), unless the authorization is terminated or revoked.  Performed at [facility], [address] [place], [place] [address]   Blood culture (routine x 2)     Status: None (Preliminary result)   Collection Time: 12/24/22  5:15 AM   Specimen: BLOOD LEFT HAND  Result Value Ref Range Status   Specimen Description   Final    BLOOD LEFT HAND BOTTLES DRAWN AEROBIC AND ANAEROBIC Performed at [facility], [address], [place], [place] [address]    Special Requests   Final    Blood Culture adequate volume Performed at [facility], [address] [place], [place] [address]    Culture   Final    NO GROWTH 1 DAY Performed at [facility], [place] [address], [place], [place] [address]    Report Status PENDING  Incomplete  Blood culture (routine x 2)     Status:  None (Preliminary result)   Collection Time: 12/24/22  5:20 AM   Specimen: Left Antecubital; Blood  Result Value Ref Range Status   Specimen Description   Final    LEFT ANTECUBITAL BOTTLES DRAWN AEROBIC AND ANAEROBIC Performed at [facility], [address] [place], [place] [address]    Special Requests   Final    Blood Culture results may not be optimal due to an excessive volume of blood received in culture bottles Performed at [facility], [address], [place], [place] [address]    Culture   Final    NO GROWTH 1 DAY Performed at [facility], [place] [address], [place], [place] [address]    Report Status PENDING  Incomplete  MRSA Next Gen by PCR, Nasal     Status: None   Collection Time: 12/24/22  6:06 PM   Specimen: Nasal Mucosa; Nasal Swab  Result Value Ref Range Status   MRSA by PCR Next Gen NOT DETECTED NOT DETECTED Final    Comment: (NOTE) The GeneXpert MRSA Assay (FDA approved for NASAL specimens only), is one component of a comprehensive MRSA colonization surveillance program. It is not intended to diagnose MRSA infection nor to guide or monitor treatment for MRSA infections. Test performance is not FDA approved in patients less than 58 years old. Performed at [facility], [address] [place], [place] [address]          Radiology Studies: CT ABDOMEN PELVIS WO CONTRAST  Result Date: 12/24/2022 CLINICAL DATA:  Sepsis. EXAM: CT ABDOMEN AND PELVIS WITHOUT CONTRAST TECHNIQUE: Multidetector CT imaging of the abdomen and pelvis was performed following the standard protocol without IV contrast. RADIATION DOSE REDUCTION: This exam was performed according to the departmental dose-optimization program which includes automated exposure control, adjustment of the mA and/or kV according to patient size and/or use of iterative reconstruction technique. COMPARISON:  08/26/2022 FINDINGS: Lower chest: There is a small left pleural effusion. Interlobular septal thickening  identified within the lung bases. Calcified granuloma identified in the anterior left base. Hepatobiliary: No focal liver abnormality. Cholecystectomy. No bile duct dilatation. Pancreas: Unremarkable. No pancreatic ductal dilatation or surrounding inflammatory changes. Spleen: Normal in size without focal abnormality. Adrenals/Urinary Tract: Normal adrenal glands. Extensive bilateral renal vascular calcifications. No hydronephrosis or mass identified. Urinary bladder is unremarkable. Stomach/Bowel: Stomach appears unremarkable. There may be edema involving  the second and third portions of the duodenum however this is limited due to lack of IV contrast material and paucity of enteric contrast material. No pathologic dilatation of the large or small bowel loops to suggest obstruction. The colon is decompressed. There is mild diffuse colonic wall thickening identified. Sigmoid diverticulosis identified. No signs of pneumatosis. Vascular/Lymphatic: Extensive aortic atherosclerosis with branch vessel involvement. No abdominopelvic adenopathy. Reproductive: Status post hysterectomy. No adnexal masses. Other: Mild soft tissue stranding is identified within the pelvic fat. There is also a trace amount of fluid noted within the dependent portion of the pelvis. No focal fluid collections identified. No signs of pneumoperitoneum. Musculoskeletal: Osteopenia. Severe right hip osteoarthritis. Scoliosis and multilevel degenerative disc disease. Unchanged mild superior endplate fracture deformity involving the L4 vertebra. IMPRESSION: 1. There is mild diffuse colonic wall thickening identified. Correlate for any clinical signs or symptoms of colitis. 2. There may be edema involving the second and third portions of the duodenum however this is limited due to lack of IV contrast material and paucity of enteric contrast material. Correlate for any clinical signs or symptoms of duodenitis. 3. Small left pleural effusion with  interstitial edema noted within the lung bases. Findings are suggestive of mild CHF. 4. Sigmoid diverticulosis without evidence for acute diverticulitis. 5. Osteopenia with multilevel degenerative disc disease and right hip osteoarthritis. 6. Unchanged mild superior endplate fracture deformity involving the L4 vertebra. 7.  Aortic Atherosclerosis (ICD10-I70.0). Electronically Signed   By: Kerby Moors M.D.   On: 12/24/2022 08:52   DG Chest Port 1 View  Result Date: 12/24/2022 CLINICAL DATA:  Concern for sepsis. Nine moaning and abdominal pain/discomfort. EXAM: PORTABLE CHEST 1 VIEW COMPARISON:  08/31/2022 FINDINGS: Cardiac enlargement. Aortic atherosclerotic calcifications. Blunting of the left costophrenic angle. Mild increase interstitial markings compatible with interstitial edema. Bilateral calcified granulomas identified. Scar versus atelectasis noted in the left base. Bones appear diffusely osteopenic. Thoracolumbar scoliosis with degenerative disc disease. IMPRESSION: 1. Suspect mild CHF. 2. Cardiac enlargement and Aortic Atherosclerosis (ICD10-I70.0). Electronically Signed   By: Kerby Moors M.D.   On: 12/24/2022 05:53        Scheduled Meds:  allopurinol  100 mg Oral Daily   amLODipine  10 mg Oral Daily   Chlorhexidine Gluconate Cloth  6 each Topical Daily   cholecalciferol  2,000 Units Oral Q lunch   cyanocobalamin  1,000 mcg Oral Q lunch   hydrALAZINE  50 mg Oral Q8H   insulin aspart  0-6 Units Subcutaneous TID WC   metoprolol succinate  12.5 mg Oral Daily   [START ON 12/27/2022] pantoprazole  40 mg Intravenous Q12H   sodium chloride flush  3 mL Intravenous Q12H   Continuous Infusions:  cefTRIAXone (ROCEPHIN)  IV 2 g (12/25/22 1446)   metronidazole Stopped (12/25/22 1058)   pantoprazole 8 mg/hr (12/25/22 1301)     LOS: 1 day    Time spent: 35 minutes    Skilar Marcou A Militza Devery, MD [facility]   If 7PM-7AM, please contact night-coverage [email or web address]  12/25/2022,  3:03 PM

## 2022-12-25 NOTE — Progress Notes (Signed)
Patient had several episodes of vomiting clear mucus gastric content that was not relieved by the zofran alone at first. Copamazine was added and noted more effect. X1 of fentanyl was given for pain not relieved by tylenol. Patient was up to the bedside and and walked around the room. Son [name] into visit and brought in a mini hand held massager, cell phone with charger, dentures, eye glasses and eye glasses case, and denture cup. Patient's libre reader removed from the left upper arm. The protonix is completed. Patient had another vomiting episode this evening with dinner after eating the chicken broth. Foley removed about 6pm. Safety maintained. Endorsed to Public house manager.

## 2022-12-25 NOTE — Progress Notes (Signed)
ANTICOAGULATION CONSULT NOTE - Initial Consult  Pharmacy Consult for warfarin  Indication: atrial fibrillation  Allergies  Allergen Reactions   Augmentin [Amoxicillin-Pot Clavulanate] Diarrhea and Nausea And Vomiting   Ciprofloxacin Diarrhea, Nausea Only and Other (See Comments)    Stomach ache   Codeine     Insomnia, anxiety   Coreg [Carvedilol] Other (See Comments)    Terrible cramping in the feet and had a lot of bowel movements, but not diarrhea   Diflucan [Fluconazole] Diarrhea   Levofloxacin In D5w     Vomiting, diarrhea, insomnia   Losartan Swelling and Other (See Comments)    Patient doesn't recall site of swelling   Privigen [Immune Globulin (Human)] Other (See Comments)    Severe/excruciating pain   Sulfamethoxazole-Trimethoprim Diarrhea, Nausea Only and Other (See Comments)    Stomach upset   Verapamil Hives   Zetia [Ezetimibe] Other (See Comments)    Reaction not recalled   Zocor [Simvastatin - High Dose] Other (See Comments)    Reaction not recalled   Gatifloxacin Rash and Other (See Comments)    Redness to skin around eye    Patient Measurements: Height: 4\' 10"  (147.3 cm) Weight: 49.8 kg (109 lb 12.6 oz) IBW/kg (Calculated) : 40.9  Vital Signs: Temp: 99.4 F (37.4 C) (03/18 1115) Temp Source: Oral (03/18 1115) BP: 176/50 (03/18 1300) Pulse Rate: 91 (03/18 1300)  Labs: Recent Labs    12/24/22 0420 12/24/22 1315 12/24/22 1525 12/24/22 2329 12/25/22 0314 12/25/22 0716 12/25/22 1035  HGB 8.9*   < > 7.5* 8.6* 9.2* 9.1*  --   HCT 28.1*   < > 23.4* 26.6* 29.2* 28.5*  --   PLT 169  --   --   --  139*  --   --   APTT 53*  --   --   --   --   --   --   LABPROT 35.2*  --   --   --   --   --  31.2*  INR 3.5*  --   --   --   --   --  3.0*  CREATININE 2.84*  --  2.40*  --  2.06*  --   --    < > = values in this interval not displayed.    Estimated Creatinine Clearance: 14.5 mL/min (A) (by C-G formula based on SCr of 2.06 mg/dL (H)).   Medical  History: Past Medical History:  Diagnosis Date   Aortic atherosclerosis ([place]) 01/23/2018   Atrial fibrillation (HCC)    Benign positional vertigo 10/06/2011   Carotid artery disease (HCC)    Chemotherapy induced neutropenia ([place]) 07/21/2019   Cholelithiasis 01/23/2018   Cholelithiasis 01/23/2018   Chronic anticoagulation    Colovesical fistula, ruled out 04/14/2022   Coronary artery disease    status post coronary artery bypass grafting times 07/10/2004   Diabetes mellitus    Hypercholesteremia    Hypertension    Mechanical heart valve present    H. aortic valve replacement at the time of bypass surgery October 2005   Moderate to severe pulmonary hypertension ([place])    Peripheral arterial disease (HCC)    history of left common iliac artery PTA and stenting for a chronic total occlusion 08/26/01   S/P cholecystectomy 03/06/2018   Assessment: 82 year old female with history afib, CAD s/p CABG and mechanical AVR on coumadin prior to admit. INR was at upper end of goal on admit at 3.5, she was last seen in [facility]  on 3/15 with an INR of 4.4.   INR down to 3.0 today. Hemoglobin up to 9.1 after 1 unit of PRBC. Patient has declined endoscopic evaluation, no overt bleeding noted. Will restart low dose warfarin tonight.  Prior to admit warfarin dose was 5mg  daily.    Goal of Therapy:  INR goal 2.5-3.5 Monitor platelets by anticoagulation protocol: Yes   Plan:  Warfarin 5mg  today If stable in am will resume home dose  [name] Hearing PharmD., BCPS Clinical Pharmacist 12/25/2022 3:57 PM

## 2022-12-26 ENCOUNTER — Encounter (HOSPITAL_COMMUNITY): Payer: Self-pay | Admitting: Family Medicine

## 2022-12-26 DIAGNOSIS — I272 Pulmonary hypertension, unspecified: Secondary | ICD-10-CM | POA: Diagnosis not present

## 2022-12-26 DIAGNOSIS — Z515 Encounter for palliative care: Secondary | ICD-10-CM

## 2022-12-26 DIAGNOSIS — I482 Chronic atrial fibrillation, unspecified: Secondary | ICD-10-CM | POA: Diagnosis not present

## 2022-12-26 DIAGNOSIS — E1122 Type 2 diabetes mellitus with diabetic chronic kidney disease: Secondary | ICD-10-CM | POA: Diagnosis not present

## 2022-12-26 DIAGNOSIS — I5033 Acute on chronic diastolic (congestive) heart failure: Secondary | ICD-10-CM | POA: Diagnosis not present

## 2022-12-26 DIAGNOSIS — R112 Nausea with vomiting, unspecified: Secondary | ICD-10-CM | POA: Diagnosis not present

## 2022-12-26 DIAGNOSIS — D631 Anemia in chronic kidney disease: Secondary | ICD-10-CM | POA: Diagnosis not present

## 2022-12-26 DIAGNOSIS — E1151 Type 2 diabetes mellitus with diabetic peripheral angiopathy without gangrene: Secondary | ICD-10-CM | POA: Diagnosis not present

## 2022-12-26 DIAGNOSIS — Z7189 Other specified counseling: Secondary | ICD-10-CM

## 2022-12-26 DIAGNOSIS — K922 Gastrointestinal hemorrhage, unspecified: Secondary | ICD-10-CM | POA: Diagnosis not present

## 2022-12-26 DIAGNOSIS — K921 Melena: Secondary | ICD-10-CM | POA: Diagnosis not present

## 2022-12-26 DIAGNOSIS — D649 Anemia, unspecified: Secondary | ICD-10-CM | POA: Diagnosis not present

## 2022-12-26 DIAGNOSIS — E43 Unspecified severe protein-calorie malnutrition: Secondary | ICD-10-CM | POA: Insufficient documentation

## 2022-12-26 DIAGNOSIS — S5001XD Contusion of right elbow, subsequent encounter: Secondary | ICD-10-CM | POA: Diagnosis not present

## 2022-12-26 DIAGNOSIS — N184 Chronic kidney disease, stage 4 (severe): Secondary | ICD-10-CM | POA: Diagnosis not present

## 2022-12-26 DIAGNOSIS — I130 Hypertensive heart and chronic kidney disease with heart failure and stage 1 through stage 4 chronic kidney disease, or unspecified chronic kidney disease: Secondary | ICD-10-CM | POA: Diagnosis not present

## 2022-12-26 DIAGNOSIS — K2980 Duodenitis without bleeding: Secondary | ICD-10-CM | POA: Diagnosis not present

## 2022-12-26 LAB — BASIC METABOLIC PANEL
Anion gap: 12 (ref 5–15)
BUN: 75 mg/dL — ABNORMAL HIGH (ref 8–23)
CO2: 19 mmol/L — ABNORMAL LOW (ref 22–32)
Calcium: 9.3 mg/dL (ref 8.9–10.3)
Chloride: 109 mmol/L (ref 98–111)
Creatinine, Ser: 1.78 mg/dL — ABNORMAL HIGH (ref 0.44–1.00)
GFR, Estimated: 28 mL/min — ABNORMAL LOW (ref >60)
Glucose, Bld: 271 mg/dL — ABNORMAL HIGH (ref 70–99)
Potassium: 3.3 mmol/L — ABNORMAL LOW (ref 3.5–5.1)
Sodium: 140 mmol/L (ref 135–145)

## 2022-12-26 LAB — GLUCOSE, CAPILLARY
Glucose-Capillary: 270 mg/dL — ABNORMAL HIGH (ref 70–99)
Glucose-Capillary: 228 mg/dL — ABNORMAL HIGH (ref 70–99)
Glucose-Capillary: 211 mg/dL — ABNORMAL HIGH (ref 70–99)
Glucose-Capillary: 231 mg/dL — ABNORMAL HIGH (ref 70–99)

## 2022-12-26 LAB — CBC
HCT: 29.2 % — ABNORMAL LOW (ref 36.0–46.0)
Hemoglobin: 9.3 g/dL — ABNORMAL LOW (ref 12.0–15.0)
MCH: 31.4 pg (ref 26.0–34.0)
MCHC: 31.8 g/dL (ref 30.0–36.0)
MCV: 98.6 fL (ref 80.0–100.0)
Platelets: 176 10*3/uL (ref 150–400)
RBC: 2.96 MIL/uL — ABNORMAL LOW (ref 3.87–5.11)
RDW: 18.2 % — ABNORMAL HIGH (ref 11.5–15.5)
WBC: 9.7 10*3/uL (ref 4.0–10.5)
nRBC: 0 % (ref 0.0–0.2)

## 2022-12-26 LAB — PROTIME-INR
INR: 3.2 — ABNORMAL HIGH (ref 0.8–1.2)
Prothrombin Time: 32.4 seconds — ABNORMAL HIGH (ref 11.4–15.2)

## 2022-12-26 LAB — MAGNESIUM: Magnesium: 1.9 mg/dL (ref 1.7–2.4)

## 2022-12-26 MED ORDER — CLONIDINE HCL 0.1 MG/24HR TD PTWK
0.1000 mg | MEDICATED_PATCH | TRANSDERMAL | Status: DC
  Administered 2022-12-26: 0.1 mg via TRANSDERMAL
  Filled 2022-12-26 (×2): qty 1

## 2022-12-26 MED ORDER — POTASSIUM CHLORIDE 10 MEQ/100ML IV SOLN
10.0000 meq | INTRAVENOUS | Status: AC
  Administered 2022-12-26 (×2): 10 meq via INTRAVENOUS
  Filled 2022-12-26 (×2): qty 100

## 2022-12-26 MED ORDER — METOPROLOL TARTRATE 5 MG/5ML IV SOLN
10.0000 mg | INTRAVENOUS | Status: AC
  Administered 2022-12-26: 10 mg via INTRAVENOUS
  Filled 2022-12-26: qty 10

## 2022-12-26 MED ORDER — METOCLOPRAMIDE HCL 5 MG/ML IJ SOLN
5.0000 mg | INTRAMUSCULAR | Status: AC
  Administered 2022-12-26: 5 mg via INTRAVENOUS
  Filled 2022-12-26: qty 2

## 2022-12-26 MED ORDER — RENA-VITE PO TABS
1.0000 | ORAL_TABLET | Freq: Every day | ORAL | Status: DC
  Administered 2022-12-26 – 2022-12-30 (×5): 1 via ORAL
  Filled 2022-12-26 (×5): qty 1

## 2022-12-26 MED ORDER — PANTOPRAZOLE SODIUM 40 MG IV SOLR
40.0000 mg | Freq: Two times a day (BID) | INTRAVENOUS | Status: DC
  Administered 2022-12-26 – 2022-12-28 (×5): 40 mg via INTRAVENOUS
  Filled 2022-12-26 (×5): qty 10

## 2022-12-26 MED ORDER — HYDRALAZINE HCL 25 MG PO TABS
100.0000 mg | ORAL_TABLET | Freq: Three times a day (TID) | ORAL | Status: DC
  Administered 2022-12-26 – 2022-12-31 (×15): 100 mg via ORAL
  Filled 2022-12-26 (×16): qty 4

## 2022-12-26 MED ORDER — PROCHLORPERAZINE EDISYLATE 10 MG/2ML IJ SOLN
10.0000 mg | Freq: Four times a day (QID) | INTRAMUSCULAR | Status: DC | PRN
  Administered 2022-12-26: 10 mg via INTRAVENOUS
  Filled 2022-12-26: qty 2

## 2022-12-26 MED ORDER — LIDOCAINE 5 % EX PTCH
1.0000 | MEDICATED_PATCH | CUTANEOUS | Status: DC
  Administered 2022-12-26 – 2022-12-30 (×5): 1 via TRANSDERMAL
  Filled 2022-12-26 (×11): qty 1

## 2022-12-26 MED ORDER — ATORVASTATIN CALCIUM 40 MG PO TABS
80.0000 mg | ORAL_TABLET | Freq: Every day | ORAL | Status: DC
  Administered 2022-12-26 – 2022-12-30 (×5): 80 mg via ORAL
  Filled 2022-12-26 (×5): qty 2

## 2022-12-26 MED ORDER — METOPROLOL TARTRATE 5 MG/5ML IV SOLN
5.0000 mg | INTRAVENOUS | Status: AC
  Administered 2022-12-26: 5 mg via INTRAVENOUS
  Filled 2022-12-26: qty 5

## 2022-12-26 MED ORDER — WARFARIN SODIUM 5 MG PO TABS
5.0000 mg | ORAL_TABLET | Freq: Once | ORAL | Status: AC
  Administered 2022-12-26: 5 mg via ORAL
  Filled 2022-12-26: qty 1

## 2022-12-26 MED ORDER — FAMOTIDINE IN NACL 20-0.9 MG/50ML-% IV SOLN
20.0000 mg | INTRAVENOUS | Status: DC
  Administered 2022-12-26 – 2022-12-27 (×2): 20 mg via INTRAVENOUS
  Filled 2022-12-26 (×2): qty 50

## 2022-12-26 MED ORDER — ONDANSETRON HCL 4 MG/2ML IJ SOLN
4.0000 mg | Freq: Three times a day (TID) | INTRAMUSCULAR | Status: DC
  Administered 2022-12-26 – 2022-12-31 (×14): 4 mg via INTRAVENOUS
  Filled 2022-12-26 (×14): qty 2

## 2022-12-26 MED ORDER — FENTANYL 25 MCG/HR TD PT72
1.0000 | MEDICATED_PATCH | TRANSDERMAL | Status: DC
  Administered 2022-12-26 – 2022-12-29 (×2): 1 via TRANSDERMAL
  Filled 2022-12-26 (×2): qty 1

## 2022-12-26 MED ORDER — POLYETHYLENE GLYCOL 3350 17 G PO PACK: 17.0000 g | PACK | Freq: Every day | ORAL | Status: DC | PRN

## 2022-12-26 NOTE — Progress Notes (Signed)
Dr [place] updated and made aware since 7am on patient blood pressures and medications given all shift. Patient asymptomatic all shift with no complaints of chest pain, shortness of breath or discomfort.

## 2022-12-26 NOTE — Plan of Care (Signed)
  Problem: Acute Rehab PT Goals(only PT should resolve) Goal: Pt Will Go Supine/Side To Sit Outcome: Progressing Flowsheets (Taken 12/26/2022 1353) Pt will go Supine/Side to Sit: with supervision Goal: Patient Will Transfer Sit To/From Stand Outcome: Progressing Flowsheets (Taken 12/26/2022 1353) Patient will transfer sit to/from stand: with supervision Goal: Pt Will Transfer Bed To Chair/Chair To Bed Outcome: Progressing Flowsheets (Taken 12/26/2022 1353) Pt will Transfer Bed to Chair/Chair to Bed:  with supervision  min guard assist Goal: Pt Will Ambulate Outcome: Progressing Flowsheets (Taken 12/26/2022 1353) Pt will Ambulate:  75 feet  with min guard assist  with rolling walker   1:54 PM, 12/26/22 Lonell Grandchild, MPT Physical Therapist with [facility] [phone] office [phone] mobile phone

## 2022-12-26 NOTE — Progress Notes (Signed)
Bladder scan performed per Dr [place] due to patient has not voided since night shift did in and out cath early this morning. Bladder scan result of 331ml. Dr [place] made aware. Foley catheter ordered and inserted per orders. Clear, yellow urine return noted. Patient tolerated well. Peri and foley care provided.   Blood pressures remain elevated post PO medications. Patient denies any pain, chest pain or shortness of breath. Dr [place] updated and new orders were placed. Will continue to monitor.

## 2022-12-26 NOTE — Consult Note (Signed)
Consultation Note Date: 12/26/2022   Patient Name: [name]  DOB: Feb 25, 1941  MRN: [id number]  Age / Sex: 82 y.o., female  PCP: [name], MD Referring Physician: [place] Eric J, DO  Reason for Consultation: Establishing goals of care  HPI/Patient Profile: 82 y.o. female  with past medical history of A-fib, CAD status post CABG and mechanical AVR on Coumadin, chronic anemia of CKD and iron deficiency, ANCA vasculitis not currently on treatment, PAD status post stenting, pulmonary hypertension, IDDM 2, HTN/HLD, urinary retention, recent admissions and workup for similar presentations with endoscopic not revealing ulcers or culprit lesions also admitted for acute heart failure with outpatient palliative care recommended discharge admitted on 12/24/2022 with acute on chronic anemia concern for GI bleed, colitis.   Clinical Assessment and Goals of Care: I have reviewed medical records including EPIC notes, labs and imaging, received report from RN, assessed the patient.  [name] is lying quietly in bed.  She appears acutely/chronically ill and fragile.  She is pale.  She is resting comfortably, but wakes easily when I call her name.  She will make an somewhat keep eye contact.  She is alert and oriented x 3, able to make her needs known.  There is no family at bedside at this time.  We meet at bedside to discuss diagnosis prognosis, GOC, EOL wishes, disposition and options.  I introduced Palliative Medicine as specialized medical care for people living with serious illness. It focuses on providing relief from the symptoms and stress of a serious illness. The goal is to improve quality of life for both the patient and the family.  We discussed a brief life review of the patient.  [name] tells me she is a widower.  She tells me that she has lived with her son for the last few weeks.  Prior to that she  was living with her daughter.  She has 2 children.  She tells me that she is independent with ADLs, able to do her own bath and grooming.  Her children help with IADLs.  We then focused on their current illness.  We talk about GI bleed and the treatment plan from the GI service.  We talked about time for outcomes.  We talk about her frailty.  She states that she would not want to go to short-term rehab, but instead return home.  The natural disease trajectory and expectations at EOL were discussed.  Advanced directives, concepts specific to code status, artifical feeding and hydration, and rehospitalization were considered and discussed.  DNR verified  Discussed the importance of continued conversation with family and the medical providers regarding overall plan of care and treatment options, ensuring decisions are within the context of the patient's values and GOCs. Questions and concerns were addressed.  The patient was encouraged to call with questions or concerns.  PMT will continue to support holistically.  Conference with attending, bedside nursing staff, transition of care team related to patient condition, needs, goals of care, disposition.  HCPOA NEXT OF  KIN -states her children would make choices as a team.    SUMMARY OF RECOMMENDATIONS   Continue to treat the treatable but no CPR or intubation Time for outcomes Prefers to return home   Code Status/Advance Care Planning: DNR  Symptom Management:  Per hospitalist, no additional needs at this time.  Palliative Prophylaxis:  Frequent Pain Assessment and Oral Care  Additional Recommendations (Limitations, Scope, Preferences): Continue to treat but no CPR or intubation  Psycho-social/Spiritual:  Desire for further Chaplaincy support:no Additional Recommendations: Caregiving  Support/Resources  Prognosis:  Unable to determine, based on outcomes.  6 months or less would not be surprising based on advanced age, chronic illness  burden, decreasing functional status.  Discharge Planning: To be determined, based on outcomes.  Anticipate home with home health if needed      Primary Diagnoses: Present on Admission:  Acute GI bleeding  Coagulopathy (HCC)  Melena  Duodenitis  Acute on chronic anemia  Acute urinary retention  Chronic diastolic CHF (congestive heart failure) (HCC)  PAF (paroxysmal atrial fibrillation) (HCC)  Vasculitis, ANCA positive  CKD (chronic kidney disease), stage IV (HCC)  History of Clostridioides difficile colitis  DNR (do not resuscitate)/DNI(Do Not Intubate)  Essential hypertension  Hypercholesteremia  Carotid artery disease (HCC)  Peripheral arterial disease (HCC)  Aortic atherosclerosis (HCC)  GERD without esophagitis  Chronic atrial fibrillation  Moderate to severe pulmonary hypertension (HCC)  Iron deficiency anemia  Diabetic retinopathy associated with type 2 diabetes mellitus (HCC)  Colitis  Hypoalbuminemia due to protein-calorie malnutrition (HCC)  CAD, multiple vessel   I have reviewed the medical record, interviewed the patient and family, and examined the patient. The following aspects are pertinent.  Past Medical History:  Diagnosis Date   Aortic atherosclerosis ([place]) 01/23/2018   Atrial fibrillation (HCC)    Benign positional vertigo 10/06/2011   Carotid artery disease [facility])    Chemotherapy induced neutropenia ([place]) 07/21/2019   Cholelithiasis 01/23/2018   Cholelithiasis 01/23/2018   Chronic anticoagulation    Colovesical fistula, ruled out 04/14/2022   Coronary artery disease    status post coronary artery bypass grafting times 07/10/2004   Diabetes mellitus    Hypercholesteremia    Hypertension    Mechanical heart valve present    H. aortic valve replacement at the time of bypass surgery October 2005   Moderate to severe pulmonary hypertension ([place])    Peripheral arterial disease ([place])    history of left common iliac artery PTA and stenting for a  chronic total occlusion 08/26/01   S/P cholecystectomy 03/06/2018   Social History   Socioeconomic History   Marital status: Widowed    Spouse name: Not on file   Number of children: Not on file   Years of education: Not on file   Highest education level: Not on file  Occupational History   Occupation: Retired  Tobacco Use   Smoking status: Former    Packs/day: 1.00    Years: 30.00    Additional pack years: 0.00    Total pack years: 30.00    Types: Cigarettes   Smokeless tobacco: Never   Tobacco comments:    quit smoking 2005  Vaping Use   Vaping Use: Never used  Substance and Sexual Activity   Alcohol use: No    Alcohol/week: 0.0 standard drinks of alcohol   Drug use: No   Sexual activity: Not Currently    Birth control/protection: None  Other Topics Concern   Not on file  Social History Narrative  Not on file   Social Determinants of Health   Financial Resource Strain: Low Risk  (02/28/2022)   Overall Financial Resource Strain (CARDIA)    Difficulty of Paying Living Expenses: Not hard at all  Food Insecurity: No Food Insecurity (12/25/2022)   Hunger Vital Sign    Worried About Running Out of Food in the Last Year: Never true    Ran Out of Food in the Last Year: Never true  Transportation Needs: No Transportation Needs (12/25/2022)   PRAPARE - Hydrologist (Medical): No    Lack of Transportation (Non-Medical): No  Physical Activity: Inactive (02/28/2022)   Exercise Vital Sign    Days of Exercise per Week: 0 days    Minutes of Exercise per Session: 0 min  Stress: No Stress Concern Present (02/28/2022)   [place]    Feeling of Stress : Only a little  Social Connections: Moderately Integrated (06/22/2022)   Social Connection and Isolation Panel [NHANES]    Frequency of Communication with Friends and Family: More than three times a week    Frequency of Social Gatherings  with Friends and Family: More than three times a week    Attends Religious Services: More than 4 times per year    Active Member of [employer] or Organizations: Yes    Attends Archivist Meetings: More than 4 times per year    Marital Status: Widowed   Family History  Problem Relation Age of Onset   Heart disease Father    Hypertension Father    Hyperlipidemia Father    Breast cancer Neg Hx    Scheduled Meds:  allopurinol  100 mg Oral Daily   amLODipine  10 mg Oral Daily   atorvastatin  80 mg Oral q1800   Chlorhexidine Gluconate Cloth  6 each Topical Daily   cholecalciferol  2,000 Units Oral Q lunch   cloNIDine  0.1 mg Transdermal Weekly   cyanocobalamin  1,000 mcg Oral Q lunch   fentaNYL  1 patch Transdermal Q72H   hydrALAZINE  100 mg Oral Q8H   insulin aspart  0-6 Units Subcutaneous TID WC   lidocaine  1 patch Transdermal Q24H   metoprolol succinate  12.5 mg Oral Daily   multivitamin  1 tablet Oral QHS   ondansetron (ZOFRAN) IV  4 mg Intravenous Q8H   pantoprazole  40 mg Intravenous Q12H   sertraline  25 mg Oral Daily   sodium chloride flush  3 mL Intravenous Q12H   warfarin  5 mg Oral ONCE-1600   Warfarin - Pharmacist Dosing Inpatient   Does not apply q1600   Continuous Infusions:  cefTRIAXone (ROCEPHIN)  IV 2 g (12/26/22 1324)   famotidine (PEPCID) IV 20 mg (12/26/22 1439)   metronidazole 500 mg (12/26/22 1013)   PRN Meds:.acetaminophen **OR** acetaminophen, ALPRAZolam, fentaNYL (SUBLIMAZE) injection, guaiFENesin-dextromethorphan, hydrALAZINE, ondansetron **OR** ondansetron (ZOFRAN) IV, [START ON 12/27/2022] polyethylene glycol, prochlorperazine, simethicone, traMADol Medications Prior to Admission:  Prior to Admission medications   Medication Sig Start Date End Date Taking? Authorizing Provider  acetaminophen (TYLENOL) 325 MG tablet Take 325 mg by mouth every 6 (six) hours as needed for mild pain or fever.   Yes [provider]  allopurinol (ZYLOPRIM) 100  MG tablet Take 100 mg by mouth daily.   Yes [provider]  ALPRAZolam Duanne Moron) 0.5 MG tablet Take 0.5 mg by mouth daily.   Yes [provider]  amLODipine (NORVASC) 10  MG tablet TAKE 1 TABLET EVERY DAY Patient taking differently: Take 10 mg by mouth daily. 05/17/22  Yes [name], MD  aspirin 81 MG chewable tablet Chew 81 mg by mouth daily. 06/29/22  Yes [provider]  atorvastatin (LIPITOR) 80 MG tablet Take 1 tablet (80 mg total) by mouth daily at 6 PM. 11/22/18  Yes [name], MD  Cholecalciferol (VITAMIN D3) 50 MCG (2000 UT) capsule Take 2,000 Units by mouth daily with lunch. 11/26/18  Yes [provider]  Cyanocobalamin (VITAMIN B12) 1000 MCG TBCR Take 1,000 mcg by mouth daily with lunch.   Yes [provider]  famotidine (PEPCID) 20 MG tablet Take 20 mg by mouth 2 (two) times daily.   Yes [provider]  ferrous sulfate 325 (65 FE) MG tablet Take 1 tablet (325 mg total) by mouth daily with breakfast. 09/06/22  Yes Johnson, Clanford L, MD  furosemide (LASIX) 40 MG tablet TAKE 2 TABLETS IN MORNING AND 1 AND 1/2 TABLETS IN EVENING. CHANGE IN DOSAGE. Patient taking differently: Take 50-80 mg by mouth See admin instructions. Take 80mg  by mouth in the morning and take 50mg  by mouth in the evening. 10/30/22  Yes [name], MD  hydrALAZINE (APRESOLINE) 100 MG tablet TAKE 1 TABLET THREE TIMES DAILY Patient taking differently: Take 100 mg by mouth 3 (three) times daily. 02/17/22  Yes [name], MD  insulin aspart (NOVOLOG) 100 UNIT/ML injection Inject 2-8 Units into the skin 3 (three) times daily with meals. Per sliding scale, If blood sugar is 200 to 250, give 2 units. If blood sugar is 251 to 300, give 4 units. If blood sugar is 301 to 350, give 6 units. If blood sugar is greater than 350, give 8 units and call MD. 09/05/22  Yes Johnson, Clanford L, MD  metoprolol succinate (TOPROL-XL) 25 MG 24 hr tablet TAKE 1/2 TABLET  EVERY DAY 10/30/22  Yes [name], MD  ondansetron (ZOFRAN-ODT) 4 MG disintegrating tablet Take 4 mg by mouth every 8 (eight) hours as needed for nausea or vomiting. 01/12/22  Yes [provider]  pantoprazole (PROTONIX) 40 MG tablet Take 40 mg by mouth 2 (two) times daily. 06/25/22  Yes [provider]  sertraline (ZOLOFT) 25 MG tablet Take 25 mg by mouth daily.   Yes [provider]  simethicone (GAS-X) 80 MG chewable tablet Chew 1 tablet (80 mg total) by mouth 4 (four) times daily as needed for flatulence. 04/16/22 04/16/23 Yes Danford, Suann Larry, MD  tadalafil, PAH, (ADCIRCA) 20 MG tablet TAKE 2 TABLETS BY MOUTH ONCE DAILY 12/11/22  Yes [name], MD  warfarin (COUMADIN) 5 MG tablet Take 1 tablet (5 mg total) by mouth See admin instructions. Take 1 tablet ever day except for on Sundays 1 and 1/2 tablets Patient taking differently: Take 5 mg by mouth daily. 09/05/22  Yes Johnson, Clanford L, MD  ALPRAZolam (XANAX) 0.25 MG tablet Take 1 tablet (0.25 mg total) by mouth 2 (two) times daily as needed for anxiety or sleep. Patient not taking: Reported on 12/24/2022 09/05/22   Murlean Iba, MD  feeding supplement, GLUCERNA SHAKE, (GLUCERNA SHAKE) LIQD Take 237 mLs by mouth 3 (three) times daily between meals. Patient not taking: Reported on 12/24/2022 09/05/22   Murlean Iba, MD  Insulin Syringe-Needle U-100 (B-D INS SYR ULTRAFINE .3CC/29G) 29G X 1/2" 0.3 ML MISC Use for sliding scale for breakthrough high blood sugar readings 02/24/22   Benay Pike, MD  Allergies  Allergen Reactions   Augmentin [Amoxicillin-Pot Clavulanate] Diarrhea and Nausea And Vomiting   Ciprofloxacin Diarrhea, Nausea Only and Other (See Comments)    Stomach ache   Codeine     Insomnia, anxiety   Coreg [Carvedilol] Other (See Comments)    Terrible cramping in the feet and had a lot of bowel movements, but not diarrhea   Diflucan [Fluconazole] Diarrhea   Levofloxacin In D5w      Vomiting, diarrhea, insomnia   Losartan Swelling and Other (See Comments)    Patient doesn't recall site of swelling   Privigen [Immune Globulin (Human)] Other (See Comments)    Severe/excruciating pain   Sulfamethoxazole-Trimethoprim Diarrhea, Nausea Only and Other (See Comments)    Stomach upset   Verapamil Hives   Zetia [Ezetimibe] Other (See Comments)    Reaction not recalled   Zocor [Simvastatin - High Dose] Other (See Comments)    Reaction not recalled   Gatifloxacin Rash and Other (See Comments)    Redness to skin around eye   Review of Systems  Unable to perform ROS: Acuity of condition    Physical Exam Vitals and nursing note reviewed.  Constitutional:      General: She is not in acute distress.    Appearance: She is ill-appearing.  Cardiovascular:     Rate and Rhythm: Normal rate.  Skin:    General: Skin is warm and dry.  Neurological:     Mental Status: She is alert and oriented to person, place, and time.  Psychiatric:        Mood and Affect: Mood normal.        Behavior: Behavior normal.     Vital Signs: BP (!) 183/62   Pulse 99   Temp (!) 97.5 F (36.4 C) (Oral)   Resp 20   Ht 4\' 10"  (1.473 m)   Wt 48.4 kg   SpO2 96%   BMI 22.30 kg/m  Pain Scale: 0-10 POSS *See Group Information*: 1-Acceptable,Awake and alert Pain Score: 0-No pain   SpO2: SpO2: 96 % O2 Device:SpO2: 96 % O2 Flow Rate: .   IO: Intake/output summary:  Intake/Output Summary (Last 24 hours) at 12/26/2022 1512 Last data filed at 12/26/2022 1012 Gross per 24 hour  Intake 303.95 ml  Output 625 ml  Net -321.05 ml    LBM: Last BM Date : 12/25/22 Baseline Weight: Weight: 52 kg Most recent weight: Weight: 48.4 kg     Palliative Assessment/Data:     Time In: 1300 Time Out: 1415 Time Total: 75 minutes Greater than 50%  of this time was spent counseling and coordinating care related to the above assessment and plan.  Signed by: Drue Novel, NP   Please contact  Palliative Medicine Team phone at [phone] for questions and concerns.  For individual provider: See Shea Evans

## 2022-12-26 NOTE — Progress Notes (Addendum)
ANTICOAGULATION CONSULT NOTE -   Pharmacy Consult for warfarin  Indication: atrial fibrillation  Allergies  Allergen Reactions   Augmentin [Amoxicillin-Pot Clavulanate] Diarrhea and Nausea And Vomiting   Ciprofloxacin Diarrhea, Nausea Only and Other (See Comments)    Stomach ache   Codeine     Insomnia, anxiety   Coreg [Carvedilol] Other (See Comments)    Terrible cramping in the feet and had a lot of bowel movements, but not diarrhea   Diflucan [Fluconazole] Diarrhea   Levofloxacin In D5w     Vomiting, diarrhea, insomnia   Losartan Swelling and Other (See Comments)    Patient doesn't recall site of swelling   Privigen [Immune Globulin (Human)] Other (See Comments)    Severe/excruciating pain   Sulfamethoxazole-Trimethoprim Diarrhea, Nausea Only and Other (See Comments)    Stomach upset   Verapamil Hives   Zetia [Ezetimibe] Other (See Comments)    Reaction not recalled   Zocor [Simvastatin - High Dose] Other (See Comments)    Reaction not recalled   Gatifloxacin Rash and Other (See Comments)    Redness to skin around eye    Patient Measurements: Height: 4\' 10"  (147.3 cm) Weight: 48.4 kg (106 lb 11.2 oz) IBW/kg (Calculated) : 40.9  Vital Signs: Temp: 98.4 F (36.9 C) (03/19 0400) Temp Source: Oral (03/19 0400) BP: 213/47 (03/19 0702) Pulse Rate: 87 (03/19 0702)  Labs: Recent Labs    12/24/22 0420 12/24/22 1315 12/24/22 1525 12/24/22 2329 12/25/22 0314 12/25/22 0716 12/25/22 1035 12/26/22 0414  HGB 8.9*   < > 7.5*   < > 9.2* 9.1*  --  9.3*  HCT 28.1*   < > 23.4*   < > 29.2* 28.5*  --  29.2*  PLT 169  --   --   --  139*  --   --  176  APTT 53*  --   --   --   --   --   --   --   LABPROT 35.2*  --   --   --   --   --  31.2* 32.4*  INR 3.5*  --   --   --   --   --  3.0* 3.2*  CREATININE 2.84*  --  2.40*  --  2.06*  --   --  1.78*   < > = values in this interval not displayed.     Estimated Creatinine Clearance: 15.5 mL/min (A) (by C-G formula based on SCr  of 1.78 mg/dL (H)).   Medical History: Past Medical History:  Diagnosis Date   Aortic atherosclerosis ([place]) 01/23/2018   Atrial fibrillation (HCC)    Benign positional vertigo 10/06/2011   Carotid artery disease (HCC)    Chemotherapy induced neutropenia ([place]) 07/21/2019   Cholelithiasis 01/23/2018   Cholelithiasis 01/23/2018   Chronic anticoagulation    Colovesical fistula, ruled out 04/14/2022   Coronary artery disease    status post coronary artery bypass grafting times 07/10/2004   Diabetes mellitus    Hypercholesteremia    Hypertension    Mechanical heart valve present    H. aortic valve replacement at the time of bypass surgery October 2005   Moderate to severe pulmonary hypertension ([place])    Peripheral arterial disease (HCC)    history of left common iliac artery PTA and stenting for a chronic total occlusion 08/26/01   S/P cholecystectomy 03/06/2018   Assessment: 82 year old female with history afib, CAD s/p CABG and mechanical AVR on coumadin prior to admit. INR  was at upper end of goal on admit at 3.5, she was last seen in [place] clinic on 3/15 with an INR of 4.4.   INR 3.2 , therapeutic Monitor closely with pt on metronidazole that can increase INR.  Hemoglobin stable 9.3 Patient has declined endoscopic evaluation, no overt bleeding noted.   Prior to admit warfarin dose was 5mg  daily.    Goal of Therapy:  INR goal 2.5-3.5 Monitor platelets by anticoagulation protocol: Yes   Plan:  Warfarin 5mg  today PT/INR daily Monitor for S/S of bleeding  [name], BS Pharm D, BCPS Clinical Pharmacist 12/26/2022 7:44 AM

## 2022-12-26 NOTE — Progress Notes (Signed)
Subjective: Continues with intermittent nausea and vomiting. Last vomited this morning after drinking some water. Had some vomiting last night as well. Still on clear liquids. Not eating very much. No abdominal pain. Had a normal BM last night without brbpr or melena. No hematemesis.   Hgb stable at 9.3.   Objective: Vital signs in last 24 hours: Temp:  [97.9 F (36.6 C)-99.4 F (37.4 C)] 97.9 F (36.6 C) (03/19 0755) Pulse Rate:  [80-104] 89 (03/19 0945) Resp:  [15-34] 20 (03/19 0945) BP: (159-223)/(39-90) 181/85 (03/19 0945) SpO2:  [95 %-99 %] 99 % (03/19 0945) Weight:  [48.4 kg] 48.4 kg (03/19 0551) Last BM Date : 12/25/22 General:   Alert and oriented, pleasant, frail.  Head:  Normocephalic and atraumatic. Eyes:  No icterus, sclera clear. Conjuctiva pink.  Abdomen:  Bowel sounds present, soft, non-distended. Minimal tenderness to palpation across upper abdomen. No HSM or hernias noted. No rebound or guarding. No masses appreciated  Msk:  Symmetrical without gross deformities. Normal posture. Psych:  Normal mood and affect.   Intake/Output from previous day: 03/18 0701 - 03/19 0700 In: 628.8 [P.O.:240; I.V.:108.3; IV Piggyback:280.5] Out: 725 [Urine:725] Intake/Output this shift: Total I/O In: 85.3 [I.V.:3; IV Piggyback:82.3] Out: 100 [Urine:100]  Lab Results: Recent Labs    12/24/22 0420 12/24/22 1315 12/25/22 0314 12/25/22 0716 12/26/22 0414  WBC 15.2*  --  9.9  --  9.7  HGB 8.9*   < > 9.2* 9.1* 9.3*  HCT 28.1*   < > 29.2* 28.5* 29.2*  PLT 169  --  139*  --  176   < > = values in this interval not displayed.   BMET Recent Labs    12/24/22 1525 12/25/22 0314 12/26/22 0414  NA 133* 135 140  K 3.9 3.5 3.3*  CL 104 105 109  CO2 18* 19* 19*  GLUCOSE 143* 104* 271*  BUN 97* 92* 75*  CREATININE 2.40* 2.06* 1.78*  CALCIUM 8.6* 8.8* 9.3   LFT Recent Labs    12/24/22 0420  PROT 6.1*  ALBUMIN 3.4*  AST 32  ALT 15  ALKPHOS 135*  BILITOT 1.2    PT/INR Recent Labs    12/25/22 1035 12/26/22 0414  LABPROT 31.2* 32.4*  INR 3.0* 3.2*    Assessment: 82 y.o. female with a history of  mechanical aortic valve chronically on Coumadin, ANCA vasculitis, CKD, atrial fibrillation, type 2 diabetes, anemia of chronic disease, who presented to [facility], ER due to abdominal pain, nausea. Similar presentation in Oct 2023. CT without contrast revealing mild diffuse colonic wall thickening. Possible edema involving second and third portions of duodenum but limited by lack of IV contrast. Other findings as above.  She was started on empiric antibiotics and IV PPI twice daily.  She has declined endoscopic evaluation, Coumadin restarted yesterday.   She has had some intermittent nausea and vomiting since her acute illness.  She is currently receiving antiemetics as needed.  Would recommend scheduling Zofran at this point.   Also with worsening anemia this admission with as low as 7.1 on 3/17 s/p 1 unit PRBCs. He hemoglobin stable/improved to 9.3 this morning. She has had no overt GI bleeding. Recommend monitoring and transfusing as needed as patient is declining procedures.    Plan: Complete 10-day course of empiric antibiotics for colitis. Continue IV PPI twice daily. Schedule Zofran every 8 hours for now. Continue clear liquid diet. Advance as tolerated.  GI will sign off.  Please reconsult if patient has  evidence of overt bleeding and is agreeable to endoscopic evaluation.   LOS: 2 days    12/26/2022, 10:21 AM   Aliene Altes, PA-C [facility]

## 2022-12-26 NOTE — Progress Notes (Signed)
EKG done and given to nurse 

## 2022-12-26 NOTE — Progress Notes (Signed)
Pt has been given multiple PRN meds for pain, nausea, and BP, with little to no relief. Multiple discussions with Dr. Nevada Crane pt may need IV gtt for BP.

## 2022-12-26 NOTE — Progress Notes (Signed)
Initial Nutrition Assessment  DOCUMENTATION CODES:   Severe malnutrition in context of chronic illness  INTERVENTION:   Multivitamin w/ minerals daily Once diet is advance, provide Boost Plus po TID, each supplement provides 360 kcal and 14 grams of protein  NUTRITION DIAGNOSIS:   Severe Malnutrition related to chronic illness as evidenced by moderate fat depletion, severe muscle depletion, percent weight loss.  GOAL:   Patient will meet greater than or equal to 90% of their needs  MONITOR:   PO intake, Supplement acceptance, Diet advancement, Labs, Weight trends, I & O's  REASON FOR ASSESSMENT:   Malnutrition Screening Tool    ASSESSMENT:   82 y.o. female presented to the ED with abdominal discomfort. PMH includes CAD s/p CABG, A. Fib, CKD IV, CHF, GERD, T2DM, HTN, and PAD. Pt admitted with acute GI bleed and sepsis possibly secondary to colitis.   Pt up in chair at time of RD visit. Tray in front of pt, RD observed<10% of meal completed. Shared that she does not like chicken broth and was agreeable to RD order alternative. Pt reports that her appetite was good at home. Reports a typical breakfast of egg, toast with jelly, and a slice of bacon; lunch: sandwich; dinner: meat and vegetable. Discussed oral nutrition supplements, pt prefers Boost chocolate, does not like Boost Breeze and declined when RD offered to order while on CLD. Pt endorses ongoing nausea and intermittent vomiting.  Pt reports that "they pulled 6# off of her", but denies any other weight loss. Reports that she uses a Rolator at home to ambulate. Per EMR, pt has had a 28.8% weight loss within 5 months, this is clinically significant and severe for time frame. Although, due to pt chronic illness unable to determine actual dry weight loss from fluid loss.  Wt Readings from Last 20 Encounters:  12/26/22 48.4 kg  12/19/22 51.7 kg  12/08/22 52.7 kg  11/10/22 49.8 kg  10/27/22 51.3 kg  10/26/22 51.7 kg  09/28/22  52.9 kg  09/18/22 54.6 kg  09/05/22 57.6 kg  08/18/22 62.7 kg  08/17/22 63.5 kg  08/11/22 63.9 kg  08/07/22 64.6 kg  08/01/22 66.9 kg  07/26/22 68 kg  07/21/22 68 kg  07/14/22 67.4 kg  07/07/22 66 kg  06/29/22 67.5 kg  06/22/22 64.4 kg   Medications reviewed and include: Vitamin D3, NovoLog SSI, Zofran, Protonix, Warfarin, IV antibiotics Labs reviewed: Sodium 140, Potassium 3.3, BUN 75, Creatinine 1.78, Magnesium 1.9, Hgb A1c 5.6%, 24 hr CBG 157-270  NUTRITION - FOCUSED PHYSICAL EXAM:  Flowsheet Row Most Recent Value  Orbital Region Moderate depletion  Upper Arm Region Mild depletion  Thoracic and Lumbar Region Mild depletion  Buccal Region Moderate depletion  Temple Region Moderate depletion  Clavicle Bone Region Moderate depletion  Clavicle and Acromion Bone Region Moderate depletion  Scapular Bone Region Moderate depletion  Dorsal Hand Moderate depletion  Patellar Region Severe depletion  Anterior Thigh Region Unable to assess  Posterior Calf Region Severe depletion  Edema (RD Assessment) None  Hair Reviewed  Eyes Reviewed  Mouth Reviewed  Skin Reviewed  Nails Reviewed   Diet Order:   Diet Order             Diet clear liquid Room service appropriate? Yes; Fluid consistency: Thin  Diet effective now                   EDUCATION NEEDS:   Education needs have been addressed  Skin:  Skin Assessment: Reviewed RN  Assessment (bruising)  Last BM:  3/18 - Type 6  Height:  Ht Readings from Last 1 Encounters:  12/24/22 4\' 10"  (1.473 m)   Weight:  Wt Readings from Last 1 Encounters:  12/26/22 48.4 kg   BMI:  Body mass index is 22.3 kg/m.  Estimated Nutritional Needs:  Kcal:  1600-1800 Protein:  70-90 grams Fluid:  >/= 1.6 L   Hermina Barters RD, LDN Clinical Dietitian See [facility] for contact information.

## 2022-12-26 NOTE — Progress Notes (Signed)
PROGRESS NOTE    [name]  [id number] DOB: 08-Jun-1941 DOA: 12/24/2022 PCP: [name], MD    Brief Narrative:   [name] is a 82 y.o. female with past medical history significant for paroxysmal atrial fibrillation on anticoagulation, CAD s/p CABG, AS s/p mechanical AVR on Coumadin, anemia of chronic medical/renal disease, iron deficiency, ANCA vasculitis currently not on treatment, PAD s/p stent, pulmonary HTN, type 2 diabetes mellitus, HTN, HLD, history of urinary retention who presented to [facility], ED on 3/17 with abdominal pain associated with nausea/vomiting.  Patient reported dark stool but is on iron supplementation outpatient.  In the ED patient was noted to be febrile with temperature 102.0 F, WBC 15, hemoglobin 7, INR 3.5, creatinine 2.8.  CT abdomen/pelvis notable for wall thickening involving the entire colon and duodenum, interstitial pulmonary edema.  Recent admission for similar presentation, underwent endoscopy which was negative for ulcers.  Additionally prior admission for CHF exacerbation.  Assessment & Plan:   Acute on chronic anemia Concern for GI bleed Patient with history of anemia of chronic medical disease, iron deficiency who presented with a hemoglobin of 7.  Patient was transfused 1 unit PRBC.  Patient was seen by gastroenterology and patient declined endoscopic evaluation.  GI okay to resume Coumadin on 3/18. --Hgb  8.9>7.1>>9.2>9.1>9.3 -- Protonix 40 mg IV q12h -- CBC daily  Sepsis, POA Colitis Patient presenting with abdominal pain associated with fever of 102.0 F, WBC count of 15.  Previous history of C. difficile.  GI PCR and C. difficile PCR was ordered but no diarrhea observed during hospitalization and now discontinued.  CT abdomen/pelvis with mild diffuse colonic wall thickening with edema second/third portion of the duodenum concerning for colitis.  Seen by gastroenterology who recommended 10-day course of antibiotics for  colitis. -- WBC 15>>9.7 -- Ceftriaxone 2 g IV q24h -- Metronidazole 500 mg IV q12h -- Start fentanyl patch 25 micrograms every 72 hours -- Fentanyl 12.5 mcg IV every 2 hours as needed severe breakthrough pain -- Lidocaine patch -- Supportive care, antiemetics with Zofran, Compazine as needed -- CBC daily  Acute renal failure on CKD stage IV Baseline creatinine 2.2-2.3.  Creatinine elevated on admission to 2.8.  Blood transfusion, IV fluid hydration. -- Cr 2.8>>1.78 -- BMP daily  Recurrent urinary retention Foley catheter was placed on admission due to urinary retention, attempted a voiding trial on 3/18 unsuccessful with recurrent urinary retention of 400 mL this morning. -- Foley catheter replaced 3/19  Paroxysmal atrial fibrillation Mechanical AVR on Coumadin Coumadin was initially held due to concern for GI bleed as above, evaluated by gastroenterology, no plans for repeat endoscopy this hospitalization and may resume Coumadin. --Continue Coumadin, pharmacy consulted for dosing/monitoring  Chronic diastolic congestive heart failure, compensated -- Hold home furosemide given poor oral intake for now -- Strict I's and O's and daily weights  CAD s/p CABG PAD -- Continue statin -- Hold aspirin for concern of GI bleed as above for now  Hypertensive urgency Hx Essential hypertension Home regimen includes amlodipine 10 mg p.o. daily, metoprolol succinate 12.5 mg p.o. daily, hydralazine 100 mg p.o. 3 times daily. -- Amlodipine 10 mg p.o. daily -- Metoprolol succinate 12.5 g p.o. daily -- Hydralazine increased from 75 to 100 mg p.o. 3 times daily -- Start clonidine patch 0.1mg   -- Continue to monitor blood pressure closely  Pulmonary hypertension --Holding tadalafil due to creatinine clearance less than 30  Anxiety/depression -- Zoloft 25 mg p.o. daily -- Xanax 0.5  mg p.o. nightly as needed anxiety  Type 2 diabetes mellitus Hemoglobin A1c 5.6 on 12/24/2022, well-controlled.   Controlled with insulin sliding scale outpatient. --Very sensitive SSI for coverage --CBG qAC/HS  GERD -- Protonix 40 mg IV every 12 hours -- Pepcid 20 mg IV every 24 hours   DVT prophylaxis: SCDs Start: 12/24/22 1431 warfarin (COUMADIN) tablet 5 mg    Code Status: DNR Family Communication: No family present at bedside this morning  Disposition Plan:  Level of care: Stepdown Status is: Inpatient Remains inpatient appropriate because: IV antibiotics, blood pressure remains poorly controlled, overall prognosis appears poor.    Consultants:  Gastroenterology Palliative care  Procedures:  None  Antimicrobials:  Ceftriaxone 3/17>> Metronidazole 3/16>> Cefepime 3/16 - 3/16   Subjective: Patient seen examined bedside, resting comfortably.  Lying in bed, uncomfortable in appearance.  RN present.  Patient complaining about pain to her back.  Blood pressure remains very poorly controlled.  RN reports In-N-Out cath this morning following Foley catheter removal yesterday.  RN reports poor oral intake with severe nausea.  Has received several doses of IV hydralazine, fentanyl and antiemetics overnight.  No family present at bedside.  Patient denies headache, no chest pain, no shortness of breath, no fever.  No other acute concerns overnight per nursing staff.  Objective: Vitals:   12/26/22 1200 12/26/22 1209 12/26/22 1300 12/26/22 1317  BP: (!) 204/55  (!) 185/48 (!) 185/48  Pulse: 85  86   Resp: (!) 24  (!) 22   Temp:  (!) 97.5 F (36.4 C)    TempSrc:  Oral    SpO2: 97%  97%   Weight:      Height:        Intake/Output Summary (Last 24 hours) at 12/26/2022 1334 Last data filed at 12/26/2022 1012 Gross per 24 hour  Intake 303.95 ml  Output 625 ml  Net -321.05 ml   Filed Weights   12/24/22 0453 12/25/22 0516 12/26/22 0551  Weight: 52 kg 49.8 kg 48.4 kg    Examination:  Physical Exam: GEN: Mild distress secondary to pain, alert, chronically ill in appearance HEENT:  NCAT, PERRL, EOMI, sclera clear, MMM PULM: CTAB w/o wheezes/crackles, normal respiratory effort, on room air CV: RRR w/o M/G/R GI: abd soft, mild tenderness to palpation, nondistended, + BS MSK: no peripheral edema, moves all extremities independently NEURO: No focal neurological deficits    Data Reviewed: I have personally reviewed following labs and imaging studies  CBC: Recent Labs  Lab 12/22/22 1310 12/24/22 0420 12/24/22 1315 12/24/22 1525 12/24/22 2329 12/25/22 0314 12/25/22 0716 12/26/22 0414  WBC 7.2 15.2*  --   --   --  9.9  --  9.7  NEUTROABS 4.5  --   --   --   --   --   --   --   HGB 9.8* 8.9*   < > 7.5* 8.6* 9.2* 9.1* 9.3*  HCT 30.4* 28.1*   < > 23.4* 26.6* 29.2* 28.5* 29.2*  MCV 97.1 98.3  --   --   --  97.7  --  98.6  PLT 150 169  --   --   --  139*  --  176   < > = values in this interval not displayed.   Basic Metabolic Panel: Recent Labs  Lab 12/22/22 1310 12/24/22 0420 12/24/22 1525 12/25/22 0314 12/26/22 0414 12/26/22 0737  NA 135 132* 133* 135 140  --   K 4.1 3.5 3.9 3.5 3.3*  --  CL 100 101 104 105 109  --   CO2 24 18* 18* 19* 19*  --   GLUCOSE 171* 187* 143* 104* 271*  --   BUN 89* 103* 97* 92* 75*  --   CREATININE 2.60* 2.84* 2.40* 2.06* 1.78*  --   CALCIUM 9.7 9.0 8.6* 8.8* 9.3  --   MG  --   --   --   --   --  1.9   GFR: Estimated Creatinine Clearance: 15.5 mL/min (A) (by C-G formula based on SCr of 1.78 mg/dL (H)). Liver Function Tests: Recent Labs  Lab 12/22/22 1310 12/24/22 0420  AST 28 32  ALT 15 15  ALKPHOS 152* 135*  BILITOT 0.9 1.2  PROT 6.5 6.1*  ALBUMIN 4.1 3.4*   Recent Labs  Lab 12/24/22 0420  LIPASE 39   No results for input(s): "AMMONIA" in the last 168 hours. Coagulation Profile: Recent Labs  Lab 12/22/22 1311 12/24/22 0420 12/25/22 1035 12/26/22 0414  INR 4.4* 3.5* 3.0* 3.2*   Cardiac Enzymes: No results for input(s): "CKTOTAL", "CKMB", "CKMBINDEX", "TROPONINI" in the last 168 hours. BNP (last 3  results) No results for input(s): "PROBNP" in the last 8760 hours. HbA1C: Recent Labs    12/24/22 1525  HGBA1C 5.6   CBG: Recent Labs  Lab 12/25/22 1112 12/25/22 1627 12/25/22 2123 12/26/22 0801 12/26/22 1211  GLUCAP 184* 157* 208* 270* 228*   Lipid Profile: No results for input(s): "CHOL", "HDL", "LDLCALC", "TRIG", "CHOLHDL", "LDLDIRECT" in the last 72 hours. Thyroid Function Tests: No results for input(s): "TSH", "T4TOTAL", "FREET4", "T3FREE", "THYROIDAB" in the last 72 hours. Anemia Panel: No results for input(s): "VITAMINB12", "FOLATE", "FERRITIN", "TIBC", "IRON", "RETICCTPCT" in the last 72 hours. Sepsis Labs: Recent Labs  Lab 12/24/22 0420 12/24/22 0735  LATICACIDVEN 1.9 1.0    Recent Results (from the past 240 hour(s))  Resp panel by RT-PCR (RSV, Flu A&B, Covid) Anterior Nasal Swab     Status: None   Collection Time: 12/24/22  5:11 AM   Specimen: Anterior Nasal Swab  Result Value Ref Range Status   SARS Coronavirus 2 by RT PCR NEGATIVE NEGATIVE Final    Comment: (NOTE) SARS-CoV-2 target nucleic acids are NOT DETECTED.  The SARS-CoV-2 RNA is generally detectable in upper respiratory specimens during the acute phase of infection. The lowest concentration of SARS-CoV-2 viral copies this assay can detect is 138 copies/mL. A negative result does not preclude SARS-Cov-2 infection and should not be used as the sole basis for treatment or other patient management decisions. A negative result may occur with  improper specimen collection/handling, submission of specimen other than nasopharyngeal swab, presence of viral mutation(s) within the areas targeted by this assay, and inadequate number of viral copies(<138 copies/mL). A negative result must be combined with clinical observations, patient history, and epidemiological information. The expected result is Negative.  Fact Sheet for Patients:  EntrepreneurPulse.com.au  Fact Sheet for Healthcare  Providers:  IncredibleEmployment.be  This test is no t yet approved or cleared by the [place] FDA and  has been authorized for detection and/or diagnosis of SARS-CoV-2 by FDA under an Emergency Use Authorization (EUA). This EUA will remain  in effect (meaning this test can be used) for the duration of the COVID-19 declaration under Section 564(b)(1) of the Act, 21 U.S.C.section 360bbb-3(b)(1), unless the authorization is terminated  or revoked sooner.       Influenza A by PCR NEGATIVE NEGATIVE Final   Influenza B by PCR NEGATIVE NEGATIVE Final  Comment: (NOTE) The Xpert Xpress SARS-CoV-2/FLU/RSV plus assay is intended as an aid in the diagnosis of influenza from Nasopharyngeal swab specimens and should not be used as a sole basis for treatment. Nasal washings and aspirates are unacceptable for Xpert Xpress SARS-CoV-2/FLU/RSV testing.  Fact Sheet for Patients: EntrepreneurPulse.com.au  Fact Sheet for Healthcare Providers: IncredibleEmployment.be  This test is not yet approved or cleared by the [place] FDA and has been authorized for detection and/or diagnosis of SARS-CoV-2 by FDA under an Emergency Use Authorization (EUA). This EUA will remain in effect (meaning this test can be used) for the duration of the COVID-19 declaration under Section 564(b)(1) of the Act, 21 U.S.C. section 360bbb-3(b)(1), unless the authorization is terminated or revoked.     Resp Syncytial Virus by PCR NEGATIVE NEGATIVE Final    Comment: (NOTE) Fact Sheet for Patients: EntrepreneurPulse.com.au  Fact Sheet for Healthcare Providers: IncredibleEmployment.be  This test is not yet approved or cleared by the [place] FDA and has been authorized for detection and/or diagnosis of SARS-CoV-2 by FDA under an Emergency Use Authorization (EUA). This EUA will remain in effect (meaning this test can be  used) for the duration of the COVID-19 declaration under Section 564(b)(1) of the Act, 21 U.S.C. section 360bbb-3(b)(1), unless the authorization is terminated or revoked.  Performed at [facility], [address], [place], [place] [address]   Blood culture (routine x 2)     Status: None (Preliminary result)   Collection Time: 12/24/22  5:15 AM   Specimen: BLOOD LEFT HAND  Result Value Ref Range Status   Specimen Description   Final    BLOOD LEFT HAND BOTTLES DRAWN AEROBIC AND ANAEROBIC   Special Requests Blood Culture adequate volume  Final   Culture   Final    NO GROWTH 2 DAYS Performed at [facility], [address] [place], [place] [address]    Report Status PENDING  Incomplete  Blood culture (routine x 2)     Status: None (Preliminary result)   Collection Time: 12/24/22  5:20 AM   Specimen: Left Antecubital; Blood  Result Value Ref Range Status   Specimen Description   Final    LEFT ANTECUBITAL BOTTLES DRAWN AEROBIC AND ANAEROBIC   Special Requests   Final    Blood Culture results may not be optimal due to an excessive volume of blood received in culture bottles   Culture   Final    NO GROWTH 2 DAYS Performed at [facility], [address], [place], [place] [address]    Report Status PENDING  Incomplete  MRSA Next Gen by PCR, Nasal     Status: None   Collection Time: 12/24/22  6:06 PM   Specimen: Nasal Mucosa; Nasal Swab  Result Value Ref Range Status   MRSA by PCR Next Gen NOT DETECTED NOT DETECTED Final    Comment: (NOTE) The GeneXpert MRSA Assay (FDA approved for NASAL specimens only), is one component of a comprehensive MRSA colonization surveillance program. It is not intended to diagnose MRSA infection nor to guide or monitor treatment for MRSA infections. Test performance is not FDA approved in patients less than 88 years old. Performed at [facility], [address], [place], [place] [address]          Radiology Studies: No results  found.      Scheduled Meds:  allopurinol  100 mg Oral Daily   amLODipine  10 mg Oral Daily   Chlorhexidine Gluconate Cloth  6 each Topical Daily  cholecalciferol  2,000 Units Oral Q lunch   cloNIDine  0.1 mg Transdermal Weekly   cyanocobalamin  1,000 mcg Oral Q lunch   fentaNYL  1 patch Transdermal Q72H   hydrALAZINE  100 mg Oral Q8H   insulin aspart  0-6 Units Subcutaneous TID WC   lidocaine  1 patch Transdermal Q24H   metoprolol succinate  12.5 mg Oral Daily   multivitamin  1 tablet Oral QHS   ondansetron (ZOFRAN) IV  4 mg Intravenous Q8H   pantoprazole  40 mg Intravenous Q12H   sertraline  25 mg Oral Daily   sodium chloride flush  3 mL Intravenous Q12H   warfarin  5 mg Oral ONCE-1600   Warfarin - Pharmacist Dosing Inpatient   Does not apply q1600   Continuous Infusions:  cefTRIAXone (ROCEPHIN)  IV 2 g (12/26/22 1324)   metronidazole 500 mg (12/26/22 1013)     LOS: 2 days    Time spent: 56 minutes spent on chart review, discussion with nursing staff, consultants, updating family and interview/physical exam; more than 50% of that time was spent in counseling and/or coordination of care.    Jerrika Ledlow J [place] DO [facility] Available via Epic secure chat 7am-7pm After these hours, please refer to coverage provider listed on [email or web address] 12/26/2022, 1:34 PM

## 2022-12-26 NOTE — Evaluation (Signed)
Physical Therapy Evaluation Patient Details Name: [name] MRN: [id number] DOB: 1940/12/02 Today's Date: 12/26/2022  History of Present Illness  [name] is an 82 y.o. female with a history of AFib, CAD s/p CABG and mechanical AVR on coumadin, chronic anemia of CKD and iron deficiency, ANCA vasculitis not currently on treatment, PAD s/p stenting, pulmonary HTN, IDT2DM, HTN, HLD, urinary retention who presented to the ED this morning with abrupt onset of abdominal discomfort, moaning. She had no complaints the day before, but had 4-5 soft stools. Stools have been dark but this is unchanged from baseline attributed to iron supplementation. She has bruises on her arms which is normal, no other bleeding. She's had nausea without vomiting. Took coumadin yesterday, not today. No dyspnea or chest pain currently.   Clinical Impression  Patient demonstrates slow labored movement for sitting up at bedside, limited to a few steps at bedside before having to sit due to c/o nausea and generalized weakness and tolerated sitting up in chair after therapy - nurse notified.  Patient will benefit from continued skilled physical therapy in hospital and recommended venue below to increase strength, balance, endurance for safe ADLs and gait.         Recommendations for follow up therapy are one component of a multi-disciplinary discharge planning process, led by the attending physician.  Recommendations may be updated based on patient status, additional functional criteria and insurance authorization.  Follow Up Recommendations Skilled nursing-short term rehab (<3 hours/day) Can patient physically be transported by private vehicle: Yes    Assistance Recommended at Discharge Set up Supervision/Assistance  Patient can return home with the following  A lot of help with walking and/or transfers;A little help with bathing/dressing/bathroom;Help with stairs or ramp for entrance;Assistance with  cooking/housework    Equipment Recommendations None recommended by PT  Recommendations for Other Services       Functional Status Assessment Patient has had a recent decline in their functional status and demonstrates the ability to make significant improvements in function in a reasonable and predictable amount of time.     Precautions / Restrictions Precautions Precautions: Fall Restrictions Weight Bearing Restrictions: No      Mobility  Bed Mobility Overal bed mobility: Needs Assistance Bed Mobility: Supine to Sit     Supine to sit: Min assist, Mod assist     General bed mobility comments: increased time, labored movement    Transfers Overall transfer level: Needs assistance Equipment used: Rolling walker (2 wheels) Transfers: Sit to/from Stand, Bed to chair/wheelchair/BSC Sit to Stand: Min assist   Step pivot transfers: Min assist       General transfer comment: unsteady labored movement using RW    Ambulation/Gait Ambulation/Gait assistance: Min assist Gait Distance (Feet): 5 Feet Assistive device: Rolling walker (2 wheels) Gait Pattern/deviations: Decreased step length - right, Decreased step length - left, Decreased stride length Gait velocity: decreased     General Gait Details: limited to a few steps at bedside due to c/o fatigue, nausea  Stairs            Wheelchair Mobility    Modified Rankin (Stroke Patients Only)       Balance Overall balance assessment: Needs assistance Sitting-balance support: Feet supported, No upper extremity supported Sitting balance-Leahy Scale: Fair Sitting balance - Comments: fair/good seated at EOB   Standing balance support: During functional activity, Bilateral upper extremity supported Standing balance-Leahy Scale: Poor Standing balance comment: fair/poor using RW  Pertinent Vitals/Pain Pain Assessment Pain Assessment: Faces Faces Pain Scale: Hurts little  more Pain Location: low back Pain Descriptors / Indicators: Sore, Discomfort Pain Intervention(s): Limited activity within patient's tolerance, Monitored during session, Repositioned    Home Living Family/patient expects to be discharged to:: Private residence Living Arrangements: Children Available Help at Discharge: Family;Available 24 hours/day Type of Home: House Home Access: Stairs to enter Entrance Stairs-Rails: Left Entrance Stairs-Number of Steps: 3   Home Layout: One level Home Equipment: Conservation officer, nature (2 wheels);Cane - single point;Shower seat;Crutches;Grab bars - toilet;Wheelchair - manual;Grab bars - tub/shower;Rollator (4 wheels)      Prior Function Prior Level of Function : Needs assist       Physical Assist : Mobility (physical);ADLs (physical) Mobility (physical): Bed mobility;Transfers;Gait;Stairs   Mobility Comments: Household ambulator with rollator and RW ADLs Comments: Independent with ADLs, family assist with iADLs     Hand Dominance   Dominant Hand: Right    Extremity/Trunk Assessment   Upper Extremity Assessment Upper Extremity Assessment: Generalized weakness    Lower Extremity Assessment Lower Extremity Assessment: Generalized weakness    Cervical / Trunk Assessment Cervical / Trunk Assessment: Normal  Communication   Communication: No difficulties  Cognition Arousal/Alertness: Awake/alert Behavior During Therapy: WFL for tasks assessed/performed Overall Cognitive Status: Within Functional Limits for tasks assessed                                          General Comments      Exercises     Assessment/Plan    PT Assessment Patient needs continued PT services  PT Problem List Decreased strength;Decreased activity tolerance;Decreased balance;Decreased mobility       PT Treatment Interventions DME instruction;Stair training;Functional mobility training;Therapeutic activities;Gait training;Therapeutic  exercise;Balance training;Patient/family education    PT Goals (Current goals can be found in the Care Plan section)  Acute Rehab PT Goals Patient Stated Goal: return home with family to assist PT Goal Formulation: With patient Time For Goal Achievement: 01/09/23 Potential to Achieve Goals: Good    Frequency Min 3X/week     Co-evaluation               AM-PAC PT "6 Clicks" Mobility  Outcome Measure Help needed turning from your back to your side while in a flat bed without using bedrails?: A Little Help needed moving from lying on your back to sitting on the side of a flat bed without using bedrails?: A Little Help needed moving to and from a bed to a chair (including a wheelchair)?: A Lot Help needed standing up from a chair using your arms (e.g., wheelchair or bedside chair)?: A Little Help needed to walk in hospital room?: A Lot Help needed climbing 3-5 steps with a railing? : A Lot 6 Click Score: 15    End of Session   Activity Tolerance: Patient tolerated treatment well;Patient limited by fatigue Patient left: in chair;with call bell/phone within reach Nurse Communication: Mobility status PT Visit Diagnosis: Unsteadiness on feet (R26.81);Other abnormalities of gait and mobility (R26.89);Muscle weakness (generalized) (M62.81)    Time: [id number] PT Time Calculation (min) (ACUTE ONLY): 17 min   Charges:   PT Evaluation $PT Eval Low Complexity: 1 Low PT Treatments $Therapeutic Activity: 8-22 mins        1:48 PM, 12/26/22 Lonell Grandchild, MPT Physical Therapist with [facility] 336 [phone] office [phone] mobile phone

## 2022-12-27 DIAGNOSIS — K922 Gastrointestinal hemorrhage, unspecified: Secondary | ICD-10-CM | POA: Diagnosis not present

## 2022-12-27 LAB — COMPREHENSIVE METABOLIC PANEL
ALT: 21 U/L (ref 0–44)
AST: 36 U/L (ref 15–41)
Albumin: 3 g/dL — ABNORMAL LOW (ref 3.5–5.0)
Alkaline Phosphatase: 105 U/L (ref 38–126)
Anion gap: 8 (ref 5–15)
BUN: 61 mg/dL — ABNORMAL HIGH (ref 8–23)
CO2: 21 mmol/L — ABNORMAL LOW (ref 22–32)
Calcium: 9.2 mg/dL (ref 8.9–10.3)
Chloride: 114 mmol/L — ABNORMAL HIGH (ref 98–111)
Creatinine, Ser: 1.63 mg/dL — ABNORMAL HIGH (ref 0.44–1.00)
GFR, Estimated: 31 mL/min — ABNORMAL LOW (ref >60)
Glucose, Bld: 178 mg/dL — ABNORMAL HIGH (ref 70–99)
Potassium: 3.4 mmol/L — ABNORMAL LOW (ref 3.5–5.1)
Sodium: 143 mmol/L (ref 135–145)
Total Bilirubin: 0.4 mg/dL (ref 0.3–1.2)
Total Protein: 5.7 g/dL — ABNORMAL LOW (ref 6.5–8.1)

## 2022-12-27 LAB — PROTIME-INR
INR: 5.3 (ref 0.8–1.2)
Prothrombin Time: 48.4 seconds — ABNORMAL HIGH (ref 11.4–15.2)

## 2022-12-27 LAB — GLUCOSE, CAPILLARY
Glucose-Capillary: 173 mg/dL — ABNORMAL HIGH (ref 70–99)
Glucose-Capillary: 267 mg/dL — ABNORMAL HIGH (ref 70–99)
Glucose-Capillary: 216 mg/dL — ABNORMAL HIGH (ref 70–99)
Glucose-Capillary: 141 mg/dL — ABNORMAL HIGH (ref 70–99)

## 2022-12-27 LAB — CBC
HCT: 28 % — ABNORMAL LOW (ref 36.0–46.0)
Hemoglobin: 8.7 g/dL — ABNORMAL LOW (ref 12.0–15.0)
MCH: 31.4 pg (ref 26.0–34.0)
MCHC: 31.1 g/dL (ref 30.0–36.0)
MCV: 101.1 fL — ABNORMAL HIGH (ref 80.0–100.0)
Platelets: 166 10*3/uL (ref 150–400)
RBC: 2.77 MIL/uL — ABNORMAL LOW (ref 3.87–5.11)
RDW: 18.8 % — ABNORMAL HIGH (ref 11.5–15.5)
WBC: 7.5 10*3/uL (ref 4.0–10.5)
nRBC: 0 % (ref 0.0–0.2)

## 2022-12-27 LAB — MAGNESIUM: Magnesium: 2.1 mg/dL (ref 1.7–2.4)

## 2022-12-27 MED ORDER — POTASSIUM CHLORIDE CRYS ER 20 MEQ PO TBCR
40.0000 meq | EXTENDED_RELEASE_TABLET | Freq: Once | ORAL | Status: AC
  Administered 2022-12-27: 40 meq via ORAL
  Filled 2022-12-27: qty 2

## 2022-12-27 NOTE — TOC Progression Note (Addendum)
Transition of Care [facility]) - Progression Note    Patient Details  Name: [name] MRN: [id number] Date of Birth: [birth year]  [facility] CM/SW Contact  [name], [place] Phone Number: 12/27/2022, 8:42 AM  Clinical Narrative:  PT recommending SNF. LCSW discussed with pt who declines. She states she lives with her daughter, [name] and can manage at home. [place] already set up. Pt agreeable to [facility] discussing with [name]. Voicemail left for [place] requesting return call.    Pt's son, [name] returned call and states pt will be coming to stay with him in [place] until [place] returns from vacation at the beginning of April. He is aware of SNF recommendation and pt request to return home. Will update [facility].   Expected Discharge Plan: [place] Barriers to Discharge: Continued Medical Work up  Expected Discharge Plan and Services In-house Referral: Clinical Social Work Discharge Planning Services: CM Consult Post Acute Care Choice: [place] arrangements for the past 2 months: Single Family Home                                       Social Determinants of Health (SDOH) Interventions SDOH Screenings   Food Insecurity: No Food Insecurity (12/25/2022)  Housing: Low Risk  (12/25/2022)  Transportation Needs: No Transportation Needs (12/25/2022)  Utilities: Not At Risk (12/25/2022)  Alcohol Screen: Low Risk  (02/13/2022)  Depression (PHQ2-9): Low Risk  (02/13/2022)  Financial Resource Strain: Low Risk  (02/28/2022)  Physical Activity: Inactive (02/28/2022)  Social Connections: Moderately Integrated (06/22/2022)  Stress: No Stress Concern Present (02/28/2022)  Tobacco Use: Medium Risk (12/26/2022)    Readmission Risk Interventions    12/25/2022    9:36 AM 08/30/2022   11:42 AM 04/20/2022   12:02 PM  Readmission Risk Prevention Plan  Transportation Screening Complete Complete Complete  Medication Review Press photographer)  Complete Complete Complete  PCP or Specialist appointment within 3-5 days of discharge   Complete  HRI or [place] Complete Complete Complete  SW Recovery Care/Counseling Consult Complete Complete Complete  Palliative Care Screening Not Applicable Not Applicable Not [place] Not Applicable Not Applicable Not Applicable

## 2022-12-27 NOTE — Progress Notes (Signed)
[place] for warfarin  Indication: atrial fibrillation  Allergies  Allergen Reactions   Augmentin [Amoxicillin-Pot Clavulanate] Diarrhea and Nausea And Vomiting   Ciprofloxacin Diarrhea, Nausea Only and Other (See Comments)    Stomach ache   Codeine     Insomnia, anxiety   Coreg [Carvedilol] Other (See Comments)    Terrible cramping in the feet and had a lot of bowel movements, but not diarrhea   Diflucan [Fluconazole] Diarrhea   Levofloxacin In D5w     Vomiting, diarrhea, insomnia   Losartan Swelling and Other (See Comments)    Patient doesn't recall site of swelling   Privigen [Immune Globulin (Human)] Other (See Comments)    Severe/excruciating pain   Sulfamethoxazole-Trimethoprim Diarrhea, Nausea Only and Other (See Comments)    Stomach upset   Verapamil Hives   Zetia [Ezetimibe] Other (See Comments)    Reaction not recalled   Zocor [Simvastatin - High Dose] Other (See Comments)    Reaction not recalled   Gatifloxacin Rash and Other (See Comments)    Redness to skin around eye    Patient Measurements: Height: 4\' 10"  (147.3 cm) Weight: 50.5 kg (111 lb 5.3 oz) IBW/kg (Calculated) : 40.9  Vital Signs: Temp: 97.8 F (36.6 C) (03/19 2351) Temp Source: Oral (03/19 2351) BP: 155/37 (03/20 0700) Pulse Rate: 93 (03/20 0700)  Labs: Recent Labs    12/25/22 0314 12/25/22 0716 12/25/22 1035 12/26/22 0414 12/27/22 0509  HGB 9.2* 9.1*  --  9.3* 8.7*  HCT 29.2* 28.5*  --  29.2* 28.0*  PLT 139*  --   --  176 166  LABPROT  --   --  31.2* 32.4* 48.4*  INR  --   --  3.0* 3.2* 5.3*  CREATININE 2.06*  --   --  1.78* 1.63*     Estimated Creatinine Clearance: 18.5 mL/min (A) (by C-G formula based on SCr of 1.63 mg/dL (H)).   Medical History: Past Medical History:  Diagnosis Date   Aortic atherosclerosis ([place]) 01/23/2018   Atrial fibrillation (HCC)    Benign positional vertigo 10/06/2011   Carotid artery disease (HCC)     Chemotherapy induced neutropenia ([place]) 07/21/2019   Cholelithiasis 01/23/2018   Cholelithiasis 01/23/2018   Chronic anticoagulation    Colovesical fistula, ruled out 04/14/2022   Coronary artery disease    status post coronary artery bypass grafting times 07/10/2004   Diabetes mellitus    Hypercholesteremia    Hypertension    Mechanical heart valve present    H. aortic valve replacement at the time of bypass surgery October 2005   Moderate to severe pulmonary hypertension ([place])    Peripheral arterial disease (HCC)    history of left common iliac artery PTA and stenting for a chronic total occlusion 08/26/01   S/P cholecystectomy 03/06/2018   Assessment: 82 year old female with history afib, CAD s/p CABG and mechanical AVR on coumadin prior to admit. INR was at upper end of goal on admit at 3.5, she was last seen in [place] clinic on 3/15 with an INR of 4.4.   INR up to 5.3 this morning, now above goal. Monitor closely with pt on metronidazole that can increase INR.  Hemoglobin down to 8.7. Patient has declined endoscopic evaluation, no overt bleeding noted.   Prior to admit warfarin dose was 5mg  daily.    Goal of Therapy:  INR goal 2.5-3.5 Monitor platelets by anticoagulation protocol: Yes   Plan:  Hold warfarin today PT/INR daily Monitor  for S/S of bleeding  [name] Hearing PharmD., BCPS Clinical Pharmacist 12/27/2022 7:35 AM

## 2022-12-27 NOTE — Progress Notes (Signed)
PROGRESS NOTE    [name]  [id number] DOB: 1941/06/19 DOA: 12/24/2022 PCP: [name], MD    Brief Narrative:   [name] is a 82 y.o. female with past medical history significant for paroxysmal atrial fibrillation on anticoagulation, CAD s/p CABG, AS s/p mechanical AVR on Coumadin, anemia of chronic medical/renal disease, iron deficiency, ANCA vasculitis currently not on treatment, PAD s/p stent, pulmonary HTN, type 2 diabetes mellitus, HTN, HLD, history of urinary retention who presented to [facility], ED on 3/17 with abdominal pain associated with nausea/vomiting.  Patient reported dark stool but is on iron supplementation outpatient.  In the ED patient was noted to be febrile with temperature 102.0 F, WBC 15, hemoglobin 7, INR 3.5, creatinine 2.8.  CT abdomen/pelvis notable for wall thickening involving the entire colon and duodenum, interstitial pulmonary edema.  Recent admission for similar presentation, underwent endoscopy which was negative for ulcers.  Additionally prior admission for CHF exacerbation.  Assessment & Plan:   Acute on chronic anemia Concern for GI bleed Patient with history of anemia of chronic medical disease, iron deficiency who presented with a hemoglobin of 7.  Patient was transfused 1 unit PRBC.  Patient was seen by gastroenterology and patient declined endoscopic evaluation.  GI okay to resume Coumadin on 3/18. --Hgb  8.9>7.1>>9.2>9.1>9.3>>8.7 -- Protonix 40 mg IV q12h -INR is supratherapeutic watch closely for further bleeding  Sepsis, POA Colitis Patient presenting with abdominal pain associated with fever of 102.0 F, WBC count of 15.  Previous history of C. difficile.   -Diarrhea this admission to allow for C. difficile testing - CT abdomen/pelvis with mild diffuse colonic wall thickening with edema second/third portion of the duodenum concerning for colitis.  -GI consult appreciated recommends 10-day course of antibiotics for  colitis. -- WBC 15>>9.7>> 7.5 -- Ceftriaxone 2 g IV q24h -- Metronidazole 500 mg IV q12h -- Start fentanyl patch 25 micrograms every 72 hours -- Fentanyl 12.5 mcg IV every 2 hours as needed severe breakthrough pain -- Lidocaine patch -- Supportive care, antiemetics with Zofran, Compazine as needed  Acute renal failure on CKD stage IV Baseline creatinine 2.2-2.3.  Creatinine elevated on admission to 2.8.  Blood transfusion, IV fluid hydration. -- Cr 2.8>>1.78 -- BMP daily  Recurrent urinary retention Foley catheter was placed on admission due to urinary retention, attempted a voiding trial on 3/18 unsuccessful with recurrent urinary retention of 400 mL this morning. -- Foley catheter replaced 12/26/22 -Flomax contraindicated due to sulfa allergy -Keep Foley in and do voiding trial as outpatient with urology team  Paroxysmal atrial fibrillation Mechanical AVR on Coumadin Coumadin was initially held due to concern for GI bleed as above, evaluated by gastroenterology, no plans for repeat endoscopy this hospitalization and as per GI team okay to resume Coumadin . --Continue Coumadin, pharmacy consulted for dosing/monitoring -INR is currently supra-therapeutic  Chronic diastolic congestive heart failure, compensated -- Hold home furosemide given poor oral intake for now -- Strict I's and O's and daily weights  CAD s/p CABG PAD -- Continue statin -- Hold aspirin for concern of GI bleed as above for now  Hypertensive urgency Hx Essential hypertension Home regimen includes amlodipine 10 mg p.o. daily, metoprolol succinate 12.5 mg p.o. daily, hydralazine 100 mg p.o. 3 times daily. -- Amlodipine 10 mg p.o. daily -- Metoprolol succinate 12.5 g p.o. daily -- Hydralazine increased from 75 to 100 mg p.o. 3 times daily -- c/n  clonidine patch 0.1mg    Pulmonary hypertension --Holding tadalafil due  to renal function  Anxiety/depression -- Zoloft 25 mg p.o. daily -- Xanax 0.5 mg p.o.  nightly as needed anxiety  Type 2 diabetes mellitus Hemoglobin A1c 5.6 at 6 12/24/2022, well-controlled.   -Use Novolog/Humalog Sliding scale insulin with Accu-Cheks/Fingersticks as ordered   GERD with superimposed -Acute GI bleed -- Protonix 40 mg IV every 12 hours -- Pepcid 20 mg IV every 24 hours  Generalized weakness and deconditioning--- physical therapist recommends SNF rehab  Social/ethics--- DNR/DNI, overall prognosis is not great given comorbidities   DVT prophylaxis: SCDs Start: 12/24/22 1431    Code Status: DNR Family Communication: No family present at bedside    Disposition Plan:  Level of care: Stepdown Status is: Inpatient   Consultants:  Gastroenterology Palliative care  Procedures:  None  Antimicrobials:  Ceftriaxone 3/17>> Metronidazole 3/16>> Cefepime 3/16 - 3/16   Subjective: Resting comfortably without chest discomfort or dyspnea No fever  Or chills   No Nausea, Vomiting or Diarrhea  Objective: Vitals:   12/27/22 1609 12/27/22 1640 12/27/22 1700 12/27/22 1709  BP:  (!) 186/63 (!) 215/91 (!) 199/56  Pulse:  97 (!) 103 (!) 106  Resp:  (!) 22 16 (!) 21  Temp: 98.2 F (36.8 C)     TempSrc: Oral     SpO2:  97% 97% 97%  Weight:      Height:        Intake/Output Summary (Last 24 hours) at 12/27/2022 1747 Last data filed at 12/27/2022 1411 Gross per 24 hour  Intake 300 ml  Output 1425 ml  Net -1125 ml   Filed Weights   12/25/22 0516 12/26/22 0551 12/27/22 0500  Weight: 49.8 kg 48.4 kg 50.5 kg   Physical Exam  Gen:- Awake Alert, in no acute distress  HEENT:- [place].AT, No sclera icterus Neck-Supple Neck,No JVD,.  Lungs-  CTAB , fair air movement bilaterally  CV- S1, S2 normal, RRR Abd-  +ve B.Sounds, Abd Soft, No tenderness,    Extremity/Skin:- No significant edema,   good pedal pulses  Psych-affect is appropriate, oriented x3 Neuro-generalized weakness , no new focal deficits, no tremors   Data Reviewed: I have personally  reviewed following labs and imaging studies  CBC: Recent Labs  Lab 12/22/22 1310 12/24/22 0420 12/24/22 1315 12/24/22 2329 12/25/22 0314 12/25/22 0716 12/26/22 0414 12/27/22 0509  WBC 7.2 15.2*  --   --  9.9  --  9.7 7.5  NEUTROABS 4.5  --   --   --   --   --   --   --   HGB 9.8* 8.9*   < > 8.6* 9.2* 9.1* 9.3* 8.7*  HCT 30.4* 28.1*   < > 26.6* 29.2* 28.5* 29.2* 28.0*  MCV 97.1 98.3  --   --  97.7  --  98.6 101.1*  PLT 150 169  --   --  139*  --  176 166   < > = values in this interval not displayed.   Basic Metabolic Panel: Recent Labs  Lab 12/24/22 0420 12/24/22 1525 12/25/22 0314 12/26/22 0414 12/26/22 0737 12/27/22 0509  NA 132* 133* 135 140  --  143  K 3.5 3.9 3.5 3.3*  --  3.4*  CL 101 104 105 109  --  114*  CO2 18* 18* 19* 19*  --  21*  GLUCOSE 187* 143* 104* 271*  --  178*  BUN 103* 97* 92* 75*  --  61*  CREATININE 2.84* 2.40* 2.06* 1.78*  --  1.63*  CALCIUM 9.0  8.6* 8.8* 9.3  --  9.2  MG  --   --   --   --  1.9 2.1   GFR: Estimated Creatinine Clearance: 18.5 mL/min (A) (by C-G formula based on SCr of 1.63 mg/dL (H)). Liver Function Tests: Recent Labs  Lab 12/22/22 1310 12/24/22 0420 12/27/22 0509  AST 28 32 36  ALT 15 15 21   ALKPHOS 152* 135* 105  BILITOT 0.9 1.2 0.4  PROT 6.5 6.1* 5.7*  ALBUMIN 4.1 3.4* 3.0*   Recent Labs  Lab 12/24/22 0420  LIPASE 39   Coagulation Profile: Recent Labs  Lab 12/22/22 1311 12/24/22 0420 12/25/22 1035 12/26/22 0414 12/27/22 0509  INR 4.4* 3.5* 3.0* 3.2* 5.3*   CBG: Recent Labs  Lab 12/26/22 1710 12/26/22 2012 12/27/22 0740 12/27/22 1104 12/27/22 1608  GLUCAP 211* 231* 173* 267* 216*   Sepsis Labs: Recent Labs  Lab 12/24/22 0420 12/24/22 0735  LATICACIDVEN 1.9 1.0    Recent Results (from the past 240 hour(s))  Resp panel by RT-PCR (RSV, Flu A&B, Covid) Anterior Nasal Swab     Status: None   Collection Time: 12/24/22  5:11 AM   Specimen: Anterior Nasal Swab  Result Value Ref Range Status    SARS Coronavirus 2 by RT PCR NEGATIVE NEGATIVE Final    Comment: (NOTE) SARS-CoV-2 target nucleic acids are NOT DETECTED.  The SARS-CoV-2 RNA is generally detectable in upper respiratory specimens during the acute phase of infection. The lowest concentration of SARS-CoV-2 viral copies this assay can detect is 138 copies/mL. A negative result does not preclude SARS-Cov-2 infection and should not be used as the sole basis for treatment or other patient management decisions. A negative result may occur with  improper specimen collection/handling, submission of specimen other than nasopharyngeal swab, presence of viral mutation(s) within the areas targeted by this assay, and inadequate number of viral copies(<138 copies/mL). A negative result must be combined with clinical observations, patient history, and epidemiological information. The expected result is Negative.  Fact Sheet for Patients:  EntrepreneurPulse.com.au  Fact Sheet for Healthcare Providers:  IncredibleEmployment.be  This test is no t yet approved or cleared by the [place] FDA and  has been authorized for detection and/or diagnosis of SARS-CoV-2 by FDA under an Emergency Use Authorization (EUA). This EUA will remain  in effect (meaning this test can be used) for the duration of the COVID-19 declaration under Section 564(b)(1) of the Act, 21 U.S.C.section 360bbb-3(b)(1), unless the authorization is terminated  or revoked sooner.       Influenza A by PCR NEGATIVE NEGATIVE Final   Influenza B by PCR NEGATIVE NEGATIVE Final    Comment: (NOTE) The Xpert Xpress SARS-CoV-2/FLU/RSV plus assay is intended as an aid in the diagnosis of influenza from Nasopharyngeal swab specimens and should not be used as a sole basis for treatment. Nasal washings and aspirates are unacceptable for Xpert Xpress SARS-CoV-2/FLU/RSV testing.  Fact Sheet for  Patients: EntrepreneurPulse.com.au  Fact Sheet for Healthcare Providers: IncredibleEmployment.be  This test is not yet approved or cleared by the [place] FDA and has been authorized for detection and/or diagnosis of SARS-CoV-2 by FDA under an Emergency Use Authorization (EUA). This EUA will remain in effect (meaning this test can be used) for the duration of the COVID-19 declaration under Section 564(b)(1) of the Act, 21 U.S.C. section 360bbb-3(b)(1), unless the authorization is terminated or revoked.     Resp Syncytial Virus by PCR NEGATIVE NEGATIVE Final    Comment: (NOTE) Fact Sheet for  Patients: EntrepreneurPulse.com.au  Fact Sheet for Healthcare Providers: IncredibleEmployment.be  This test is not yet approved or cleared by the [place] FDA and has been authorized for detection and/or diagnosis of SARS-CoV-2 by FDA under an Emergency Use Authorization (EUA). This EUA will remain in effect (meaning this test can be used) for the duration of the COVID-19 declaration under Section 564(b)(1) of the Act, 21 U.S.C. section 360bbb-3(b)(1), unless the authorization is terminated or revoked.  Performed at [facility], [address] [place], [place] [address]   Blood culture (routine x 2)     Status: None (Preliminary result)   Collection Time: 12/24/22  5:15 AM   Specimen: BLOOD LEFT HAND  Result Value Ref Range Status   Specimen Description   Final    BLOOD LEFT HAND BOTTLES DRAWN AEROBIC AND ANAEROBIC   Special Requests Blood Culture adequate volume  Final   Culture   Final    NO GROWTH 3 DAYS Performed at [facility], [address] [place], [place] [address]    Report Status PENDING  Incomplete  Blood culture (routine x 2)     Status: None (Preliminary result)   Collection Time: 12/24/22  5:20 AM   Specimen: Left Antecubital; Blood  Result Value Ref Range Status   Specimen Description    Final    LEFT ANTECUBITAL BOTTLES DRAWN AEROBIC AND ANAEROBIC   Special Requests   Final    Blood Culture results may not be optimal due to an excessive volume of blood received in culture bottles   Culture   Final    NO GROWTH 3 DAYS Performed at [facility] [address], [place], [place] [address]    Report Status PENDING  Incomplete  MRSA Next Gen by PCR, Nasal     Status: None   Collection Time: 12/24/22  6:06 PM   Specimen: Nasal Mucosa; Nasal Swab  Result Value Ref Range Status   MRSA by PCR Next Gen NOT DETECTED NOT DETECTED Final    Comment: (NOTE) The GeneXpert MRSA Assay (FDA approved for NASAL specimens only), is one component of a comprehensive MRSA colonization surveillance program. It is not intended to diagnose MRSA infection nor to guide or monitor treatment for MRSA infections. Test performance is not FDA approved in patients less than 34 years old. Performed at [facility], [address], [place], [place] [address]     Scheduled Meds:  allopurinol  100 mg Oral Daily   amLODipine  10 mg Oral Daily   atorvastatin  80 mg Oral q1800   Chlorhexidine Gluconate Cloth  6 each Topical Daily   cholecalciferol  2,000 Units Oral Q lunch   cloNIDine  0.1 mg Transdermal Weekly   cyanocobalamin  1,000 mcg Oral Q lunch   fentaNYL  1 patch Transdermal Q72H   hydrALAZINE  100 mg Oral Q8H   insulin aspart  0-6 Units Subcutaneous TID WC   lidocaine  1 patch Transdermal Q24H   metoprolol succinate  12.5 mg Oral Daily   multivitamin  1 tablet Oral QHS   ondansetron (ZOFRAN) IV  4 mg Intravenous Q8H   pantoprazole  40 mg Intravenous Q12H   sertraline  25 mg Oral Daily   sodium chloride flush  3 mL Intravenous Q12H   Warfarin - Pharmacist Dosing Inpatient   Does not apply q1600   Continuous Infusions:  cefTRIAXone (ROCEPHIN)  IV Stopped (12/27/22 1333)   famotidine (PEPCID) IV 20 mg (12/27/22 1347)   metronidazole Stopped (12/27/22 1005)  LOS: 3 days    Roxan Hockey, MD [facility] Available via Epic secure chat 7am-7pm After these hours, please refer to coverage provider listed on [email or web address] 12/27/2022, 5:47 PM

## 2022-12-28 DIAGNOSIS — K922 Gastrointestinal hemorrhage, unspecified: Secondary | ICD-10-CM | POA: Diagnosis not present

## 2022-12-28 LAB — RENAL FUNCTION PANEL
Albumin: 3.1 g/dL — ABNORMAL LOW (ref 3.5–5.0)
Anion gap: 8 (ref 5–15)
BUN: 53 mg/dL — ABNORMAL HIGH (ref 8–23)
CO2: 20 mmol/L — ABNORMAL LOW (ref 22–32)
Calcium: 9 mg/dL (ref 8.9–10.3)
Chloride: 117 mmol/L — ABNORMAL HIGH (ref 98–111)
Creatinine, Ser: 1.73 mg/dL — ABNORMAL HIGH (ref 0.44–1.00)
GFR, Estimated: 29 mL/min — ABNORMAL LOW (ref >60)
Glucose, Bld: 146 mg/dL — ABNORMAL HIGH (ref 70–99)
Phosphorus: 2.9 mg/dL (ref 2.5–4.6)
Potassium: 4.3 mmol/L (ref 3.5–5.1)
Sodium: 145 mmol/L (ref 135–145)

## 2022-12-28 LAB — GLUCOSE, CAPILLARY
Glucose-Capillary: 146 mg/dL — ABNORMAL HIGH (ref 70–99)
Glucose-Capillary: 228 mg/dL — ABNORMAL HIGH (ref 70–99)
Glucose-Capillary: 159 mg/dL — ABNORMAL HIGH (ref 70–99)
Glucose-Capillary: 125 mg/dL — ABNORMAL HIGH (ref 70–99)

## 2022-12-28 LAB — CBC
HCT: 28 % — ABNORMAL LOW (ref 36.0–46.0)
Hemoglobin: 8.4 g/dL — ABNORMAL LOW (ref 12.0–15.0)
MCH: 31.2 pg (ref 26.0–34.0)
MCHC: 30 g/dL (ref 30.0–36.0)
MCV: 104.1 fL — ABNORMAL HIGH (ref 80.0–100.0)
Platelets: 175 10*3/uL (ref 150–400)
RBC: 2.69 MIL/uL — ABNORMAL LOW (ref 3.87–5.11)
RDW: 19.4 % — ABNORMAL HIGH (ref 11.5–15.5)
WBC: 7.4 10*3/uL (ref 4.0–10.5)
nRBC: 0 % (ref 0.0–0.2)

## 2022-12-28 LAB — PROTIME-INR
INR: 6.6 (ref 0.8–1.2)
Prothrombin Time: 56.8 seconds — ABNORMAL HIGH (ref 11.4–15.2)

## 2022-12-28 MED ORDER — FAMOTIDINE 20 MG PO TABS
20.0000 mg | ORAL_TABLET | Freq: Every day | ORAL | Status: DC
  Administered 2022-12-28 – 2022-12-31 (×4): 20 mg via ORAL
  Filled 2022-12-28 (×4): qty 1

## 2022-12-28 MED ORDER — PANTOPRAZOLE SODIUM 40 MG PO TBEC
40.0000 mg | DELAYED_RELEASE_TABLET | Freq: Two times a day (BID) | ORAL | Status: DC
  Administered 2022-12-28 – 2022-12-31 (×6): 40 mg via ORAL
  Filled 2022-12-28 (×6): qty 1

## 2022-12-28 MED ORDER — PANTOPRAZOLE SODIUM 40 MG PO TBEC: 40.0000 mg | DELAYED_RELEASE_TABLET | Freq: Two times a day (BID) | ORAL | Status: DC

## 2022-12-28 NOTE — Progress Notes (Signed)
PROGRESS NOTE    [name]  [id number] DOB: 06/18/1941 DOA: 12/24/2022 PCP: [name], MD    Brief Narrative:   [name] is a 82 y.o. female with past medical history significant for paroxysmal atrial fibrillation on anticoagulation, CAD s/p CABG, AS s/p mechanical AVR on Coumadin, anemia of chronic medical/renal disease, iron deficiency, ANCA vasculitis currently not on treatment, PAD s/p stent, pulmonary HTN, type 2 diabetes mellitus, HTN, HLD, history of urinary retention who presented to [facility], ED on 3/17 with abdominal pain associated with nausea/vomiting.  Patient reported dark stool but is on iron supplementation outpatient.  In the ED patient was noted to be febrile with temperature 102.0 F, WBC 15, hemoglobin 7, INR 3.5, creatinine 2.8.  CT abdomen/pelvis notable for wall thickening involving the entire colon and duodenum, interstitial pulmonary edema.  Recent admission for similar presentation, underwent endoscopy which was negative for ulcers.  Additionally prior admission for CHF exacerbation.  Assessment & Plan:   Acute on chronic anemia Concern for GI bleed Patient with history of anemia of chronic medical disease, iron deficiency who presented with a hemoglobin of 7.  Patient was transfused 1 unit PRBC.  Patient was seen by gastroenterology and patient declined endoscopic evaluation.  GI okay to resume Coumadin on 12/25/22 12/28/22 --Hgb  8.9>7.1>>9.2>9.1>9.3>>8.7>>8.4 -INR is supratherapeutic watch closely for further bleeding (INR 3.2 >>5.3 >>6.6) -No obvious bleeding noted at this time however given that Hgb is drifting down and ionized trending up patient remains high risk for bleeding -Continue Protonix and Pepcid   Sepsis, POA Colitis Patient presenting with abdominal pain associated with fever of 102.0 F, WBC count of 15.  Previous history of C. difficile.   -Diarrhea this admission to allow for C. difficile testing - CT abdomen/pelvis  with mild diffuse colonic wall thickening with edema second/third portion of the duodenum concerning for colitis.  -GI consult appreciated recommends 10-day course of antibiotics for colitis. -Leukocytosis has resolved -- Ceftriaxone 2 g IV q24h -- Metronidazole 500 mg IV q12h -Continue fentanyl patch 25 micrograms every 72 hours -- Fentanyl 12.5 mcg IV every 2 hours as needed severe breakthrough pain -- Lidocaine patch -- Supportive care, antiemetics with Zofran, Compazine as needed  Acute renal failure on CKD stage IV Baseline creatinine 2.2-2.3.   Creatinine elevated on admission to 2.8.   -Renal function improving after transfusion of PRBC and IV fluids --- Cr 2.8>>1.78>> 1.73 - renally adjust medications, avoid nephrotoxic agents / dehydration  / hypotension   Recurrent urinary retention Foley catheter was placed on admission due to urinary retention, attempted a voiding trial on 3/18 unsuccessful with recurrent urinary retention of 400 mL this morning. -- Foley catheter replaced 12/26/22 -Flomax contraindicated due to sulfa allergy -Keep Foley in and do voiding trial as outpatient with urology team  Paroxysmal atrial fibrillation Mechanical AVR on Coumadin Coumadin was initially held due to concern for GI bleed as above, evaluated by gastroenterology, no plans for repeat endoscopy this hospitalization and as per GI team okay to resume Coumadin . --Continue Coumadin, pharmacy consulted for dosing/monitoring -INR is currently supra-therapeutic  Chronic diastolic congestive heart failure, compensated -- Hold home furosemide given poor oral intake for now -- Strict I's and O's and daily weights  CAD s/p CABG PAD -- Continue statin -- Hold aspirin for concern of GI bleed as above for now  Hypertensive urgency Hx Essential hypertension Home regimen includes amlodipine 10 mg p.o. daily, metoprolol succinate 12.5 mg p.o. daily, hydralazine  100 mg p.o. 3 times daily. --  Amlodipine 10 mg p.o. daily -- Metoprolol succinate 12.5 g p.o. daily -- Hydralazine increased from 75 to 100 mg p.o. 3 times daily -- c/n  clonidine patch 0.1mg    Pulmonary hypertension --Holding tadalafil due to renal function  Anxiety/depression -- Zoloft 25 mg p.o. daily -- Xanax 0.5 mg p.o. nightly as needed anxiety  Type 2 diabetes mellitus Hemoglobin A1c 5.6 at 6 12/24/2022, well-controlled.   -Use Novolog/Humalog Sliding scale insulin with Accu-Cheks/Fingersticks as ordered   GERD with superimposed -Acute GI bleed -- Protonix 40 mg bid  -- Pepcid 20 mg bid  Generalized weakness and deconditioning--- physical therapist recommends SNF rehab  Social/ethics--- DNR/DNI, overall prognosis is not great given comorbidities   DVT prophylaxis: SCDs Start: 12/24/22 1431/Coumadin    Code Status: DNR Family Communication: No family present at bedside    Disposition Plan: Physical therapy recommends SNF rehab Level of care: Stepdown Status is: Inpatient  Consultants:  Gastroenterology Palliative care  Procedures:  None  Antimicrobials:  Ceftriaxone 3/17>> Metronidazole 3/16>> Cefepime 3/16 - 3/16   Subjective: - Sitting up in the chair----no chest pains or significant dyspnea No fever  Or chills  No Nausea, Vomiting or Diarrhea -No bleeding concerns    Objective: Vitals:   12/28/22 0800 12/28/22 0900 12/28/22 0914 12/28/22 0916  BP: (!) 185/50 (!) 154/82 (!) 154/82   Pulse: (!) 121 (!) 113  (!) 112  Resp: 17 20  16   Temp:      TempSrc:      SpO2: 99% 97%    Weight:      Height:        Intake/Output Summary (Last 24 hours) at 12/28/2022 1036 Last data filed at 12/28/2022 [id number] Gross per 24 hour  Intake 350 ml  Output 850 ml  Net -500 ml   Filed Weights   12/26/22 0551 12/27/22 0500 12/28/22 0606  Weight: 48.4 kg 50.5 kg 49.4 kg   Physical Exam  Gen:- Awake Alert, in no acute distress  HEENT:- [place].AT, No sclera icterus Neck-Supple Neck,No JVD,.   Lungs-  CTAB , fair air movement bilaterally  CV- S1, S2 normal, RRR,  Abd-  +ve B.Sounds, Abd Soft, No tenderness,    Extremity/Skin:- No significant edema,   good pedal pulses  Psych-affect is appropriate, oriented x3 Neuro-generalized weakness , no new focal deficits, no tremors GU- foley in situ with clear urine   Data Reviewed: I have personally reviewed following labs and imaging studies  CBC: Recent Labs  Lab 12/22/22 1310 12/24/22 0420 12/24/22 1315 12/25/22 0314 12/25/22 0716 12/26/22 0414 12/27/22 0509 12/28/22 0525  WBC 7.2 15.2*  --  9.9  --  9.7 7.5 7.4  NEUTROABS 4.5  --   --   --   --   --   --   --   HGB 9.8* 8.9*   < > 9.2* 9.1* 9.3* 8.7* 8.4*  HCT 30.4* 28.1*   < > 29.2* 28.5* 29.2* 28.0* 28.0*  MCV 97.1 98.3  --  97.7  --  98.6 101.1* 104.1*  PLT 150 169  --  139*  --  176 166 175   < > = values in this interval not displayed.   Basic Metabolic Panel: Recent Labs  Lab 12/24/22 1525 12/25/22 0314 12/26/22 0414 12/26/22 0737 12/27/22 0509 12/28/22 0525  NA 133* 135 140  --  143 145  K 3.9 3.5 3.3*  --  3.4* 4.3  CL 104 105 109  --  114* 117*  CO2 18* 19* 19*  --  21* 20*  GLUCOSE 143* 104* 271*  --  178* 146*  BUN 97* 92* 75*  --  61* 53*  CREATININE 2.40* 2.06* 1.78*  --  1.63* 1.73*  CALCIUM 8.6* 8.8* 9.3  --  9.2 9.0  MG  --   --   --  1.9 2.1  --   PHOS  --   --   --   --   --  2.9   GFR: Estimated Creatinine Clearance: 17.2 mL/min (A) (by C-G formula based on SCr of 1.73 mg/dL (H)). Liver Function Tests: Recent Labs  Lab 12/22/22 1310 12/24/22 0420 12/27/22 0509 12/28/22 0525  AST 28 32 36  --   ALT 15 15 21   --   ALKPHOS 152* 135* 105  --   BILITOT 0.9 1.2 0.4  --   PROT 6.5 6.1* 5.7*  --   ALBUMIN 4.1 3.4* 3.0* 3.1*   Recent Labs  Lab 12/24/22 0420  LIPASE 39   Coagulation Profile: Recent Labs  Lab 12/24/22 0420 12/25/22 1035 12/26/22 0414 12/27/22 0509 12/28/22 0525  INR 3.5* 3.0* 3.2* 5.3* 6.6*   CBG: Recent  Labs  Lab 12/27/22 0740 12/27/22 1104 12/27/22 1608 12/27/22 2215 12/28/22 0730  GLUCAP 173* 267* 216* 141* 146*   Sepsis Labs: Recent Labs  Lab 12/24/22 0420 12/24/22 0735  LATICACIDVEN 1.9 1.0    Recent Results (from the past 240 hour(s))  Resp panel by RT-PCR (RSV, Flu A&B, Covid) Anterior Nasal Swab     Status: None   Collection Time: 12/24/22  5:11 AM   Specimen: Anterior Nasal Swab  Result Value Ref Range Status   SARS Coronavirus 2 by RT PCR NEGATIVE NEGATIVE Final    Comment: (NOTE) SARS-CoV-2 target nucleic acids are NOT DETECTED.  The SARS-CoV-2 RNA is generally detectable in upper respiratory specimens during the acute phase of infection. The lowest concentration of SARS-CoV-2 viral copies this assay can detect is 138 copies/mL. A negative result does not preclude SARS-Cov-2 infection and should not be used as the sole basis for treatment or other patient management decisions. A negative result may occur with  improper specimen collection/handling, submission of specimen other than nasopharyngeal swab, presence of viral mutation(s) within the areas targeted by this assay, and inadequate number of viral copies(<138 copies/mL). A negative result must be combined with clinical observations, patient history, and epidemiological information. The expected result is Negative.  Fact Sheet for Patients:  EntrepreneurPulse.com.au  Fact Sheet for Healthcare Providers:  IncredibleEmployment.be  This test is no t yet approved or cleared by the [place] FDA and  has been authorized for detection and/or diagnosis of SARS-CoV-2 by FDA under an Emergency Use Authorization (EUA). This EUA will remain  in effect (meaning this test can be used) for the duration of the COVID-19 declaration under Section 564(b)(1) of the Act, 21 U.S.C.section 360bbb-3(b)(1), unless the authorization is terminated  or revoked sooner.       Influenza A  by PCR NEGATIVE NEGATIVE Final   Influenza B by PCR NEGATIVE NEGATIVE Final    Comment: (NOTE) The Xpert Xpress SARS-CoV-2/FLU/RSV plus assay is intended as an aid in the diagnosis of influenza from Nasopharyngeal swab specimens and should not be used as a sole basis for treatment. Nasal washings and aspirates are unacceptable for Xpert Xpress SARS-CoV-2/FLU/RSV testing.  Fact Sheet for Patients: EntrepreneurPulse.com.au  Fact Sheet for Healthcare Providers: IncredibleEmployment.be  This test is not yet approved  or cleared by the [place] and has been authorized for detection and/or diagnosis of SARS-CoV-2 by FDA under an Emergency Use Authorization (EUA). This EUA will remain in effect (meaning this test can be used) for the duration of the COVID-19 declaration under Section 564(b)(1) of the Act, 21 U.S.C. section 360bbb-3(b)(1), unless the authorization is terminated or revoked.     Resp Syncytial Virus by PCR NEGATIVE NEGATIVE Final    Comment: (NOTE) Fact Sheet for Patients: EntrepreneurPulse.com.au  Fact Sheet for Healthcare Providers: IncredibleEmployment.be  This test is not yet approved or cleared by the [place] FDA and has been authorized for detection and/or diagnosis of SARS-CoV-2 by FDA under an Emergency Use Authorization (EUA). This EUA will remain in effect (meaning this test can be used) for the duration of the COVID-19 declaration under Section 564(b)(1) of the Act, 21 U.S.C. section 360bbb-3(b)(1), unless the authorization is terminated or revoked.  Performed at [facility], [address], [place], [place] [address]   Blood culture (routine x 2)     Status: None (Preliminary result)   Collection Time: 12/24/22  5:15 AM   Specimen: BLOOD LEFT HAND  Result Value Ref Range Status   Specimen Description   Final    BLOOD LEFT HAND BOTTLES DRAWN AEROBIC AND ANAEROBIC    Special Requests Blood Culture adequate volume  Final   Culture   Final    NO GROWTH 4 DAYS Performed at [facility], [address] [place], [place] [address]    Report Status PENDING  Incomplete  Blood culture (routine x 2)     Status: None (Preliminary result)   Collection Time: 12/24/22  5:20 AM   Specimen: Left Antecubital; Blood  Result Value Ref Range Status   Specimen Description   Final    LEFT ANTECUBITAL BOTTLES DRAWN AEROBIC AND ANAEROBIC   Special Requests   Final    Blood Culture results may not be optimal due to an excessive volume of blood received in culture bottles   Culture   Final    NO GROWTH 4 DAYS Performed at [name], [address] [place], [place] [address]    Report Status PENDING  Incomplete  MRSA Next Gen by PCR, Nasal     Status: None   Collection Time: 12/24/22  6:06 PM   Specimen: Nasal Mucosa; Nasal Swab  Result Value Ref Range Status   MRSA by PCR Next Gen NOT DETECTED NOT DETECTED Final    Comment: (NOTE) The GeneXpert MRSA Assay (FDA approved for NASAL specimens only), is one component of a comprehensive MRSA colonization surveillance program. It is not intended to diagnose MRSA infection nor to guide or monitor treatment for MRSA infections. Test performance is not FDA approved in patients less than 15 years old. Performed at [facility], [address] [place], [place] [address]     Scheduled Meds:  allopurinol  100 mg Oral Daily   amLODipine  10 mg Oral Daily   atorvastatin  80 mg Oral q1800   Chlorhexidine Gluconate Cloth  6 each Topical Daily   cholecalciferol  2,000 Units Oral Q lunch   cloNIDine  0.1 mg Transdermal Weekly   cyanocobalamin  1,000 mcg Oral Q lunch   famotidine  20 mg Oral BID   fentaNYL  1 patch Transdermal Q72H   hydrALAZINE  100 mg Oral Q8H   insulin aspart  0-6 Units Subcutaneous TID WC   lidocaine  1 patch Transdermal Q24H   metoprolol succinate  12.5 mg Oral Daily   multivitamin  1 tablet Oral QHS    ondansetron (ZOFRAN) IV  4 mg Intravenous Q8H   pantoprazole  40 mg Oral BID   sertraline  25 mg Oral Daily   sodium chloride flush  3 mL Intravenous Q12H   Warfarin - Pharmacist Dosing Inpatient   Does not apply q1600   Continuous Infusions:  cefTRIAXone (ROCEPHIN)  IV Stopped (12/27/22 1333)   metronidazole 500 mg (12/28/22 0959)    LOS: 4 days   Roxan Hockey, MD [facility] Available via Epic secure chat 7am-7pm After these hours, please refer to coverage provider listed on [email or web address] 12/28/2022, 10:36 AM

## 2022-12-28 NOTE — Progress Notes (Signed)
Patient complaining of right arm tenderness/pain at elbow site where IV is. Bleeding noted at insertion site and IV removed. Pressure applied. Gauze placed. Pink and a small soft pocket noted below where IV was. Tender to touch but NOT hot to touch. Ice pack applied with good effect and patient states pain/tenderness has decreased. Dr Joesph Fillers aware.

## 2022-12-28 NOTE — Progress Notes (Signed)
Notified Dr. Josephine Cables and msg sent to pharmacy INR 6.6, no s/s bleed

## 2022-12-28 NOTE — Progress Notes (Signed)
[facility] for warfarin  Indication: atrial fibrillation and aortic valve replacement  Allergies  Allergen Reactions   Augmentin [Amoxicillin-Pot Clavulanate] Diarrhea and Nausea And Vomiting   Ciprofloxacin Diarrhea, Nausea Only and Other (See Comments)    Stomach ache   Codeine     Insomnia, anxiety   Coreg [Carvedilol] Other (See Comments)    Terrible cramping in the feet and had a lot of bowel movements, but not diarrhea   Diflucan [Fluconazole] Diarrhea   Levofloxacin In D5w     Vomiting, diarrhea, insomnia   Losartan Swelling and Other (See Comments)    Patient doesn't recall site of swelling   Privigen [Immune Globulin (Human)] Other (See Comments)    Severe/excruciating pain   Sulfamethoxazole-Trimethoprim Diarrhea, Nausea Only and Other (See Comments)    Stomach upset   Verapamil Hives   Zetia [Ezetimibe] Other (See Comments)    Reaction not recalled   Zocor [Simvastatin - High Dose] Other (See Comments)    Reaction not recalled   Gatifloxacin Rash and Other (See Comments)    Redness to skin around eye    Patient Measurements: Height: 4\' 10"  (147.3 cm) Weight: 49.4 kg (108 lb 14.5 oz) IBW/kg (Calculated) : 40.9  Vital Signs: Temp: 98.5 F (36.9 C) (03/21 0750) Temp Source: Oral (03/21 0750) BP: 154/82 (03/21 0914) Pulse Rate: 112 (03/21 0916)  Labs: Recent Labs    12/26/22 0414 12/27/22 0509 12/28/22 0525  HGB 9.3* 8.7* 8.4*  HCT 29.2* 28.0* 28.0*  PLT 176 166 175  LABPROT 32.4* 48.4* 56.8*  INR 3.2* 5.3* 6.6*  CREATININE 1.78* 1.63* 1.73*     Estimated Creatinine Clearance: 17.2 mL/min (A) (by C-G formula based on SCr of 1.73 mg/dL (H)).   Medical History: Past Medical History:  Diagnosis Date   Aortic atherosclerosis ([place]) 01/23/2018   Atrial fibrillation (H[facility])    Benign positional vertigo 10/06/2011   Carotid artery disease (HCC)    Chemotherapy induced neutropenia ([place]) 07/21/2019   Cholelithiasis  01/23/2018   Cholelithiasis 01/23/2018   Chronic anticoagulation    Colovesical fistula, ruled out 04/14/2022   Coronary artery disease    status post coronary artery bypass grafting times 07/10/2004   Diabetes mellitus    Hypercholesteremia    Hypertension    Mechanical heart valve present    H. aortic valve replacement at the time of bypass surgery October 2005   Moderate to severe pulmonary hypertension ([place])    Peripheral arterial disease (HCC)    history of left common iliac artery PTA and stenting for a chronic total occlusion 08/26/01   S/P cholecystectomy 03/06/2018   Assessment: 82 year old female with history afib, CAD s/p CABG and mechanical AVR on coumadin prior to admit. INR was at upper end of goal on admit at 3.5, she was last seen in [facility] on 3/15 with an INR of 4.4.   INR 3.5 > 3.0 > 3.2 > 5.3 > 6.6 Hgb 8.4 - no s/s of bleeding  Prior to admit warfarin dose was 5mg  daily.    Goal of Therapy:  INR goal 2.5-3.5 Monitor platelets by anticoagulation protocol: Yes   Plan:  Hold warfarin today PT/INR daily Monitor for S/S of bleeding  Margot Ables, PharmD Clinical Pharmacist 12/28/2022 10:22 AM

## 2022-12-28 NOTE — Progress Notes (Signed)
Physical Therapy Treatment Patient Details Name: [name] MRN: [id number] DOB: April 16, 1941 Today's Date: 12/28/2022   History of Present Illness [name] is an 82 y.o. female with a history of AFib, CAD s/p CABG and mechanical AVR on coumadin, chronic anemia of CKD and iron deficiency, ANCA vasculitis not currently on treatment, PAD s/p stenting, pulmonary HTN, IDT2DM, HTN, HLD, urinary retention who presented to the ED this morning with abrupt onset of abdominal discomfort, moaning. She had no complaints the day before, but had 4-5 soft stools. Stools have been dark but this is unchanged from baseline attributed to iron supplementation. She has bruises on her arms which is normal, no other bleeding. She's had nausea without vomiting. Took coumadin yesterday, not today. No dyspnea or chest pain currently.    PT Comments    Patient demonstrates increased BLE strength for completing sit to stands, transfers and ambulation in room/hallway without loss of balance.  Patient ambulated in hallway with slightly labored cadence without loss of balance and limited mostly due to fatigue, fair/good return for completing exercises while seated in chair and tolerated staying up in chair after therapy - RN aware.  Patient will benefit from continued skilled physical therapy in hospital and recommended venue below to increase strength, balance, endurance for safe ADLs and gait.     Recommendations for follow up therapy are one component of a multi-disciplinary discharge planning process, led by the attending physician.  Recommendations may be updated based on patient status, additional functional criteria and insurance authorization.  Follow Up Recommendations  Home health PT Can patient physically be transported by private vehicle: Yes   Assistance Recommended at Discharge Set up Supervision/Assistance  Patient can return home with the following A little help with bathing/dressing/bathroom;Help with  stairs or ramp for entrance;Assistance with cooking/housework;A little help with walking and/or transfers   Equipment Recommendations  None recommended by PT    Recommendations for Other Services       Precautions / Restrictions Precautions Precautions: Fall Restrictions Weight Bearing Restrictions: No     Mobility  Bed Mobility Overal bed mobility: Needs Assistance Bed Mobility: Supine to Sit     Supine to sit: Supervision     General bed mobility comments: slightly labored movement, increased time    Transfers Overall transfer level: Needs assistance Equipment used: Rolling walker (2 wheels) Transfers: Sit to/from Stand, Bed to chair/wheelchair/BSC Sit to Stand: Supervision   Step pivot transfers: Supervision       General transfer comment: increased BLE strength for completing sit to stands and transfers    Ambulation/Gait Ambulation/Gait assistance: Supervision, Min guard Gait Distance (Feet): 65 Feet Assistive device: Rolling walker (2 wheels) Gait Pattern/deviations: Decreased step length - right, Decreased step length - left, Decreased stride length Gait velocity: decreased     General Gait Details: increased endurance/distance with labored movement using RW without loss of balance, limited most due to fatigue   Stairs             Wheelchair Mobility    Modified Rankin (Stroke Patients Only)       Balance Overall balance assessment: Needs assistance Sitting-balance support: Feet supported, No upper extremity supported Sitting balance-Leahy Scale: Good Sitting balance - Comments: seated at EOB   Standing balance support: During functional activity, Bilateral upper extremity supported Standing balance-Leahy Scale: Fair Standing balance comment: fair/good using RW  Cognition Arousal/Alertness: Awake/alert Behavior During Therapy: WFL for tasks assessed/performed Overall Cognitive Status: Within  Functional Limits for tasks assessed                                          Exercises General Exercises - Lower Extremity Long Arc Quad: Seated, AROM, Strengthening, Both, 10 reps Straight Leg Raises: Seated, AROM, Strengthening, Both, 10 reps Hip Flexion/Marching: Seated, AROM, Strengthening, Both, 10 reps Toe Raises: Seated, AROM, Strengthening, Both, 10 reps    General Comments        Pertinent Vitals/Pain Pain Assessment Pain Assessment: No/denies pain    Home Living                          Prior Function            PT Goals (current goals can now be found in the care plan section) Acute Rehab PT Goals Patient Stated Goal: return home with family to assist PT Goal Formulation: With patient Time For Goal Achievement: 01/09/23 Potential to Achieve Goals: Good Progress towards PT goals: Progressing toward goals    Frequency    Min 3X/week      PT Plan Discharge plan needs to be updated    Co-evaluation              AM-PAC PT "6 Clicks" Mobility   Outcome Measure  Help needed turning from your back to your side while in a flat bed without using bedrails?: None Help needed moving from lying on your back to sitting on the side of a flat bed without using bedrails?: A Little Help needed moving to and from a bed to a chair (including a wheelchair)?: A Little Help needed standing up from a chair using your arms (e.g., wheelchair or bedside chair)?: A Little Help needed to walk in hospital room?: A Little Help needed climbing 3-5 steps with a railing? : A Little 6 Click Score: 19    End of Session   Activity Tolerance: Patient tolerated treatment well;Patient limited by fatigue Patient left: in chair;with call bell/phone within reach Nurse Communication: Mobility status PT Visit Diagnosis: Unsteadiness on feet (R26.81);Other abnormalities of gait and mobility (R26.89);Muscle weakness (generalized) (M62.81)     Time:  [id number] PT Time Calculation (min) (ACUTE ONLY): 24 min  Charges:  $Gait Training: 8-22 mins $Therapeutic Exercise: 8-22 mins                     2:05 PM, 12/28/22 [name], MPT Physical Therapist with [facility] 336 [phone] office [phone] mobile phone

## 2022-12-29 DIAGNOSIS — K922 Gastrointestinal hemorrhage, unspecified: Secondary | ICD-10-CM | POA: Diagnosis not present

## 2022-12-29 LAB — GLUCOSE, CAPILLARY
Glucose-Capillary: 159 mg/dL — ABNORMAL HIGH (ref 70–99)
Glucose-Capillary: 216 mg/dL — ABNORMAL HIGH (ref 70–99)
Glucose-Capillary: 125 mg/dL — ABNORMAL HIGH (ref 70–99)
Glucose-Capillary: 144 mg/dL — ABNORMAL HIGH (ref 70–99)

## 2022-12-29 LAB — CULTURE, BLOOD (ROUTINE X 2)
Culture: NO GROWTH
Culture: NO GROWTH
Special Requests: ADEQUATE

## 2022-12-29 LAB — PROTIME-INR
INR: 5.5 (ref 0.8–1.2)
Prothrombin Time: 49.9 seconds — ABNORMAL HIGH (ref 11.4–15.2)

## 2022-12-29 MED ORDER — SENNOSIDES-DOCUSATE SODIUM 8.6-50 MG PO TABS
2.0000 | ORAL_TABLET | Freq: Every day | ORAL | Status: DC
  Administered 2022-12-29 – 2022-12-30 (×2): 2 via ORAL
  Filled 2022-12-29 (×2): qty 2

## 2022-12-29 MED ORDER — LABETALOL HCL 5 MG/ML IV SOLN
20.0000 mg | Freq: Once | INTRAVENOUS | Status: AC
  Administered 2022-12-29: 20 mg via INTRAVENOUS
  Filled 2022-12-29: qty 4

## 2022-12-29 MED ORDER — CLONIDINE HCL 0.2 MG/24HR TD PTWK
0.2000 mg | MEDICATED_PATCH | TRANSDERMAL | Status: DC
  Administered 2022-12-29: 0.2 mg via TRANSDERMAL
  Filled 2022-12-29: qty 1

## 2022-12-29 MED ORDER — POLYETHYLENE GLYCOL 3350 17 G PO PACK
17.0000 g | PACK | Freq: Every day | ORAL | Status: DC
  Administered 2022-12-29 – 2022-12-30 (×2): 17 g via ORAL
  Filled 2022-12-29 (×3): qty 1

## 2022-12-29 NOTE — Progress Notes (Signed)
[place] for warfarin  Indication: atrial fibrillation and aortic valve replacement  Allergies  Allergen Reactions   Augmentin [Amoxicillin-Pot Clavulanate] Diarrhea and Nausea And Vomiting   Ciprofloxacin Diarrhea, Nausea Only and Other (See Comments)    Stomach ache   Codeine     Insomnia, anxiety   Coreg [Carvedilol] Other (See Comments)    Terrible cramping in the feet and had a lot of bowel movements, but not diarrhea   Diflucan [Fluconazole] Diarrhea   Levofloxacin In D5w     Vomiting, diarrhea, insomnia   Losartan Swelling and Other (See Comments)    Patient doesn't recall site of swelling   Privigen [Immune Globulin (Human)] Other (See Comments)    Severe/excruciating pain   Sulfamethoxazole-Trimethoprim Diarrhea, Nausea Only and Other (See Comments)    Stomach upset   Verapamil Hives   Zetia [Ezetimibe] Other (See Comments)    Reaction not recalled   Zocor [Simvastatin - High Dose] Other (See Comments)    Reaction not recalled   Gatifloxacin Rash and Other (See Comments)    Redness to skin around eye    Patient Measurements: Height: 4\' 10"  (147.3 cm) Weight: 50 kg (110 lb 3.7 oz) IBW/kg (Calculated) : 40.9  Vital Signs: Temp: 98.5 F (36.9 C) (03/22 0807) Temp Source: Oral (03/22 0807) BP: 202/139 (03/22 0600) Pulse Rate: 94 (03/22 0600)  Labs: Recent Labs    12/27/22 0509 12/28/22 0525 12/29/22 0422  HGB 8.7* 8.4*  --   HCT 28.0* 28.0*  --   PLT 166 175  --   LABPROT 48.4* 56.8* 49.9*  INR 5.3* 6.6* 5.5*  CREATININE 1.63* 1.73*  --     Estimated Creatinine Clearance: 17.3 mL/min (A) (by C-G formula based on SCr of 1.73 mg/dL (H)).  Assessment: 82 year old female with history afib, CAD s/p CABG and mechanical AVR on coumadin prior to admit. INR was at upper end of goal on admit at 3.5, she was last seen in [facility] on 3/15 with an INR of 4.4.   Prior to admit warfarin dose was 5mg  daily.  3/22: INR  remains supratherapeutic but trending down. Some bleeding noted at IV site last night.    Goal of Therapy:  INR goal 2.5-3.5 Monitor platelets by anticoagulation protocol: Yes   Plan:  Hold warfarin today PT/INR daily Monitor for S/S of bleeding  Lorenso Courier, PharmD Clinical Pharmacist 12/29/2022 8:49 AM

## 2022-12-29 NOTE — Consult Note (Signed)
   [facility] Inpatient Consult   12/29/2022  DALA NOLTON 1941-02-26 [id number]  Orientation with Natividad Brood, [facility] Liaison for review.   Location:  [facility] Liaison spoke with daughter [name]) remotely [facility]).   [facility]) Accountable Care Organization [ACO] Patient: Sport and exercise psychologist)   Primary Care Provider:  Dr. Domenick Gong at [facility]   Patient screened for readmission hospitalization with noted extreme high  risk score for unplanned readmission. [facility] liaison will risk to assess for potential [facility]) Care Management service needs for post hospital transition for care coordination. Daughter receptive to a referral for [facility] RN for care coordination services. Daughter [name]) verified No SNF and pt will return home with [facility] services. Pt has sufficient transportation, ability to obtain all medications and a good support system.  Plan:  Pt currently in stepdown unit. HIPAA verified with next of care daughter [name]) and [facility] RN liaison will continue to follow ongoing disposition to assess for post hospital community care coordination/management needs.  Referral request for community care coordination: pending disposition.   [place] does not replace or interfere with any arrangements made by the Inpatient Transition of Care team.   For questions contact:     Raina Mina, RN, [place] Hours M-F 8:00 am to 5 pm [phone] mobile [phone] [Office toll free line][facility] Office Hours are M-F 8:30 - 5 pm 24 hour nurse advise line [phone] Conceirge  [email or web address]

## 2022-12-29 NOTE — Progress Notes (Signed)
PROGRESS NOTE    [name]  [id number] DOB: [birth year] DOA: 12/24/2022 PCP: [name], MD    Brief Narrative:   [name] is a 82 y.o. female with past medical history significant for paroxysmal atrial fibrillation on anticoagulation, CAD s/p CABG, AS s/p mechanical AVR on Coumadin, anemia of chronic medical/renal disease, iron deficiency, ANCA vasculitis currently not on treatment, PAD s/p stent, pulmonary HTN, type 2 diabetes mellitus, HTN, HLD, history of urinary retention who presented to [facility] ED on 3/17 with abdominal pain associated with nausea/vomiting.  Patient reported dark stool but is on iron supplementation outpatient.  In the ED patient was noted to be febrile with temperature 102.0 F, WBC 15, hemoglobin 7, INR 3.5, creatinine 2.8.  CT abdomen/pelvis notable for wall thickening involving the entire colon and duodenum, interstitial pulmonary edema.  Recent admission for similar presentation, underwent endoscopy which was negative for ulcers.  Additionally prior admission for CHF exacerbation.  Assessment & Plan:   Acute on chronic anemia Concern for GI bleed Patient with history of anemia of chronic medical disease, iron deficiency who presented with a hemoglobin of 7.  Patient was transfused 1 unit PRBC.  Patient was seen by gastroenterology and patient declined endoscopic evaluation.  GI okay to resume Coumadin on 12/25/22 12/29/22 --Hgb  8.9>7.1>>9.2>9.1>9.3>>8.7>>8.4 -INR is supratherapeutic watch closely for further bleeding (INR 3.2 >>5.3 >>6.6>5.5 -No obvious bleeding noted at this time however given that Hgb is drifting down and ionized trending up patient remains high risk for bleeding -Continue Protonix and Pepcid -INR is starting to trend down  Hypertensive urgency Hx Essential hypertension 12/29/22 --Severelly elevated BP noted SBP > 200 mmhg DBP >> 130 mmhg Home regimen includes amlodipine 10 mg p.o. daily, metoprolol succinate 12.5  mg p.o. daily, hydralazine 100 mg p.o. 3 times daily. -- Amlodipine 10 mg p.o. daily -- Metoprolol succinate 12.5 g p.o. daily -Continue hydralazine 100 mg p.o. 3 times daily -Change clonidine patch to 0.2 mg from 0.1 -Give IV labetalol  Sepsis, POA Colitis Patient presenting with abdominal pain associated with fever of 102.0 F, WBC count of 15.  Previous history of C. difficile.   -Diarrhea this admission to allow for C. difficile testing - CT abdomen/pelvis with mild diffuse colonic wall thickening with edema second/third portion of the duodenum concerning for colitis.  -GI consult appreciated recommends 10-day course of antibiotics for colitis. -Leukocytosis has resolved -- Ceftriaxone 2 g IV q24h -- Metronidazole 500 mg IV q12h -Continue fentanyl patch 25 micrograms every 72 hours -- Fentanyl 12.5 mcg IV every 2 hours as needed severe breakthrough pain -- Lidocaine patch -- Supportive care, antiemetics with Zofran, Compazine as needed  Acute renal failure on CKD stage IV Baseline creatinine 2.2-2.3.   Creatinine elevated on admission to 2.8.   -Renal function improving after transfusion of PRBC and IV fluids --- Cr 2.8>>1.78>> 1.73 - renally adjust medications, avoid nephrotoxic agents / dehydration  / hypotension  Recurrent urinary retention Foley catheter was placed on admission due to urinary retention, attempted a voiding trial on 3/18 unsuccessful with recurrent urinary retention of 400 mL this morning. -- Foley catheter replaced 12/26/22 -Flomax contraindicated due to sulfa allergy -Keep Foley in and do voiding trial as outpatient with urology team  Paroxysmal atrial fibrillation Mechanical AVR on Coumadin Coumadin was initially held due to concern for GI bleed as above, evaluated by gastroenterology, no plans for repeat endoscopy this hospitalization and as per GI team okay to resume Coumadin . --  Continue Coumadin, pharmacy consulted for dosing/monitoring -INR is  currently supra-therapeutic  Chronic diastolic congestive heart failure, compensated -- Hold home furosemide given poor oral intake for now -- Strict I's and O's and daily weights  CAD s/p CABG/PAD -- Continue statin -- Hold aspirin for concern of GI bleed as above for now  Pulmonary hypertension --Holding tadalafil due to renal function  Anxiety/depression -- Zoloft 25 mg p.o. daily -- Xanax 0.5 mg p.o. nightly as needed anxiety  Type 2 diabetes mellitus Hemoglobin A1c 5.6 at 6 12/24/2022, well-controlled.   -Use Novolog/Humalog Sliding scale insulin with Accu-Cheks/Fingersticks as ordered   GERD with superimposed -Acute GI bleed -- Protonix 40 mg bid  -- Pepcid 20 mg bid  Generalized weakness and deconditioning--- physical therapist recommends SNF rehab  Social/ethics--- DNR/DNI, overall prognosis is Not great given comorbidities   DVT prophylaxis: SCDs Start: 12/24/22 1431/Coumadin    Code Status: DNR Family Communication: No family present at bedside    Disposition Plan: Physical therapy recommends SNF rehab Level of care: Stepdown Status is: Inpatient  Consultants:  Gastroenterology Palliative care  Procedures:  None  Antimicrobials:  Ceftriaxone 3/17>> Metronidazole 3/16>> Cefepime 3/16 - 3/16   Subjective: - Sitting up in the chair----no chest pains or significant dyspnea No fever  Or chills  No Nausea, Vomiting or Diarrhea -No bleeding concerns  -Severelly elevated BP noted -Patient's son at bedside, questions answered  Objective: Vitals:   12/29/22 0500 12/29/22 0521 12/29/22 0600 12/29/22 0807  BP: (!) 178/64  (!) 202/139   Pulse: 88  94   Resp: 17  (!) 24   Temp:  98.3 F (36.8 C)  98.5 F (36.9 C)  TempSrc:  Oral  Oral  SpO2: 99%  98%   Weight:  50 kg    Height:        Intake/Output Summary (Last 24 hours) at 12/29/2022 0838 Last data filed at 12/29/2022 0600 Gross per 24 hour  Intake 699.93 ml  Output 1000 ml  Net -300.07 ml    Filed Weights   12/27/22 0500 12/28/22 0606 12/29/22 0521  Weight: 50.5 kg 49.4 kg 50 kg   Physical Exam  Gen:- Awake Alert, in no acute distress  HEENT:- .AT, No sclera icterus Neck-Supple Neck,No JVD,.  Lungs-  CTAB , fair air movement bilaterally  CV- S1, S2 normal, RRR,  Abd-  +ve B.Sounds, Abd Soft, No tenderness,    Extremity/Skin:- No significant edema,   good pedal pulses  Psych-affect is appropriate, oriented x3 Neuro-generalized weakness , no new focal deficits, no tremors GU- foley in situ with clear urine   Data Reviewed: I have personally reviewed following labs and imaging studies  CBC: Recent Labs  Lab 12/22/22 1310 12/24/22 0420 12/24/22 1315 12/25/22 0314 12/25/22 0716 12/26/22 0414 12/27/22 0509 12/28/22 0525  WBC 7.2 15.2*  --  9.9  --  9.7 7.5 7.4  NEUTROABS 4.5  --   --   --   --   --   --   --   HGB 9.8* 8.9*   < > 9.2* 9.1* 9.3* 8.7* 8.4*  HCT 30.4* 28.1*   < > 29.2* 28.5* 29.2* 28.0* 28.0*  MCV 97.1 98.3  --  97.7  --  98.6 101.1* 104.1*  PLT 150 169  --  139*  --  176 166 175   < > = values in this interval not displayed.   Basic Metabolic Panel: Recent Labs  Lab 12/24/22 1525 12/25/22 0314 12/26/22 0414 12/26/22 [id number]  12/27/22 0509 12/28/22 0525  NA 133* 135 140  --  143 145  K 3.9 3.5 3.3*  --  3.4* 4.3  CL 104 105 109  --  114* 117*  CO2 18* 19* 19*  --  21* 20*  GLUCOSE 143* 104* 271*  --  178* 146*  BUN 97* 92* 75*  --  61* 53*  CREATININE 2.40* 2.06* 1.78*  --  1.63* 1.73*  CALCIUM 8.6* 8.8* 9.3  --  9.2 9.0  MG  --   --   --  1.9 2.1  --   PHOS  --   --   --   --   --  2.9   GFR: Estimated Creatinine Clearance: 17.3 mL/min (A) (by C-G formula based on SCr of 1.73 mg/dL (H)). Liver Function Tests: Recent Labs  Lab 12/22/22 1310 12/24/22 0420 12/27/22 0509 12/28/22 0525  AST 28 32 36  --   ALT 15 15 21   --   ALKPHOS 152* 135* 105  --   BILITOT 0.9 1.2 0.4  --   PROT 6.5 6.1* 5.7*  --   ALBUMIN 4.1 3.4* 3.0*  3.1*   Recent Labs  Lab 12/24/22 0420  LIPASE 39   Coagulation Profile: Recent Labs  Lab 12/25/22 1035 12/26/22 0414 12/27/22 0509 12/28/22 0525 12/29/22 0422  INR 3.0* 3.2* 5.3* 6.6* 5.5*   CBG: Recent Labs  Lab 12/28/22 0730 12/28/22 1132 12/28/22 1616 12/28/22 2013 12/29/22 0821  GLUCAP 146* 228* 159* 125* 159*   Sepsis Labs: Recent Labs  Lab 12/24/22 0420 12/24/22 0735  LATICACIDVEN 1.9 1.0    Recent Results (from the past 240 hour(s))  Resp panel by RT-PCR (RSV, Flu A&B, Covid) Anterior Nasal Swab     Status: None   Collection Time: 12/24/22  5:11 AM   Specimen: Anterior Nasal Swab  Result Value Ref Range Status   SARS Coronavirus 2 by RT PCR NEGATIVE NEGATIVE Final    Comment: (NOTE) SARS-CoV-2 target nucleic acids are NOT DETECTED.  The SARS-CoV-2 RNA is generally detectable in upper respiratory specimens during the acute phase of infection. The lowest concentration of SARS-CoV-2 viral copies this assay can detect is 138 copies/mL. A negative result does not preclude SARS-Cov-2 infection and should not be used as the sole basis for treatment or other patient management decisions. A negative result may occur with  improper specimen collection/handling, submission of specimen other than nasopharyngeal swab, presence of viral mutation(s) within the areas targeted by this assay, and inadequate number of viral copies(<138 copies/mL). A negative result must be combined with clinical observations, patient history, and epidemiological information. The expected result is Negative.  Fact Sheet for Patients:  EntrepreneurPulse.com.au  Fact Sheet for Healthcare Providers:  IncredibleEmployment.be  This test is no t yet approved or cleared by the [place] FDA and  has been authorized for detection and/or diagnosis of SARS-CoV-2 by FDA under an Emergency Use Authorization (EUA). This EUA will remain  in effect (meaning  this test can be used) for the duration of the COVID-19 declaration under Section 564(b)(1) of the Act, 21 U.S.C.section 360bbb-3(b)(1), unless the authorization is terminated  or revoked sooner.       Influenza A by PCR NEGATIVE NEGATIVE Final   Influenza B by PCR NEGATIVE NEGATIVE Final    Comment: (NOTE) The Xpert Xpress SARS-CoV-2/FLU/RSV plus assay is intended as an aid in the diagnosis of influenza from Nasopharyngeal swab specimens and should not be used as a sole basis for  treatment. Nasal washings and aspirates are unacceptable for Xpert Xpress SARS-CoV-2/FLU/RSV testing.  Fact Sheet for Patients: EntrepreneurPulse.com.au  Fact Sheet for Healthcare Providers: IncredibleEmployment.be  This test is not yet approved or cleared by the [place] FDA and has been authorized for detection and/or diagnosis of SARS-CoV-2 by FDA under an Emergency Use Authorization (EUA). This EUA will remain in effect (meaning this test can be used) for the duration of the COVID-19 declaration under Section 564(b)(1) of the Act, 21 U.S.C. section 360bbb-3(b)(1), unless the authorization is terminated or revoked.     Resp Syncytial Virus by PCR NEGATIVE NEGATIVE Final    Comment: (NOTE) Fact Sheet for Patients: EntrepreneurPulse.com.au  Fact Sheet for Healthcare Providers: IncredibleEmployment.be  This test is not yet approved or cleared by the [place] FDA and has been authorized for detection and/or diagnosis of SARS-CoV-2 by FDA under an Emergency Use Authorization (EUA). This EUA will remain in effect (meaning this test can be used) for the duration of the COVID-19 declaration under Section 564(b)(1) of the Act, 21 U.S.C. section 360bbb-3(b)(1), unless the authorization is terminated or revoked.  Performed at [facility], [address], [place], [place] [address]   Blood culture (routine x 2)      Status: None (Preliminary result)   Collection Time: 12/24/22  5:15 AM   Specimen: BLOOD LEFT HAND  Result Value Ref Range Status   Specimen Description   Final    BLOOD LEFT HAND BOTTLES DRAWN AEROBIC AND ANAEROBIC   Special Requests Blood Culture adequate volume  Final   Culture   Final    NO GROWTH 4 DAYS Performed at [facility] [address], [place], [place] [address]    Report Status PENDING  Incomplete  Blood culture (routine x 2)     Status: None (Preliminary result)   Collection Time: 12/24/22  5:20 AM   Specimen: Left Antecubital; Blood  Result Value Ref Range Status   Specimen Description   Final    LEFT ANTECUBITAL BOTTLES DRAWN AEROBIC AND ANAEROBIC   Special Requests   Final    Blood Culture results may not be optimal due to an excessive volume of blood received in culture bottles   Culture   Final    NO GROWTH 4 DAYS Performed at [facility], [address] [place], [place] [address]    Report Status PENDING  Incomplete  MRSA Next Gen by PCR, Nasal     Status: None   Collection Time: 12/24/22  6:06 PM   Specimen: Nasal Mucosa; Nasal Swab  Result Value Ref Range Status   MRSA by PCR Next Gen NOT DETECTED NOT DETECTED Final    Comment: (NOTE) The GeneXpert MRSA Assay (FDA approved for NASAL specimens only), is one component of a comprehensive MRSA colonization surveillance program. It is not intended to diagnose MRSA infection nor to guide or monitor treatment for MRSA infections. Test performance is not FDA approved in patients less than 45 years old. Performed at [facility], [address] [place], [place] [address]     Scheduled Meds:  allopurinol  100 mg Oral Daily   amLODipine  10 mg Oral Daily   atorvastatin  80 mg Oral q1800   Chlorhexidine Gluconate Cloth  6 each Topical Daily   cholecalciferol  2,000 Units Oral Q lunch   cloNIDine  0.1 mg Transdermal Weekly   cyanocobalamin  1,000 mcg Oral Q lunch   famotidine  20 mg Oral Daily   fentaNYL  1  patch Transdermal Q72H   hydrALAZINE  100 mg Oral Q8H   insulin aspart  0-6 Units Subcutaneous TID WC   lidocaine  1 patch Transdermal Q24H   metoprolol succinate  12.5 mg Oral Daily   multivitamin  1 tablet Oral QHS   ondansetron (ZOFRAN) IV  4 mg Intravenous Q8H   pantoprazole  40 mg Oral BID   sertraline  25 mg Oral Daily   sodium chloride flush  3 mL Intravenous Q12H   Warfarin - Pharmacist Dosing Inpatient   Does not apply q1600   Continuous Infusions:  cefTRIAXone (ROCEPHIN)  IV Stopped (12/28/22 1501)   metronidazole Stopped (12/28/22 2211)    LOS: 5 days   Roxan Hockey, MD [facility] Available via Epic secure chat 7am-7pm After these hours, please refer to coverage provider listed on [email or web address] 12/29/2022, 8:38 AM

## 2022-12-29 NOTE — Progress Notes (Signed)
Patient transferred from ICU in stable condition , alert and oriented x 4.     12/29/22 1700  Vitals  BP (!) 136/118  Pulse Rate 75  Resp 20  MEWS COLOR  MEWS Score Color Green  Oxygen Therapy  SpO2 95 %  MEWS Score  MEWS Temp 0  MEWS Systolic 0  MEWS Pulse 0  MEWS RR 0  MEWS LOC 0  MEWS Score 0

## 2022-12-30 DIAGNOSIS — K922 Gastrointestinal hemorrhage, unspecified: Secondary | ICD-10-CM | POA: Diagnosis not present

## 2022-12-30 LAB — GLUCOSE, CAPILLARY
Glucose-Capillary: 115 mg/dL — ABNORMAL HIGH (ref 70–99)
Glucose-Capillary: 262 mg/dL — ABNORMAL HIGH (ref 70–99)
Glucose-Capillary: 158 mg/dL — ABNORMAL HIGH (ref 70–99)
Glucose-Capillary: 118 mg/dL — ABNORMAL HIGH (ref 70–99)

## 2022-12-30 LAB — CBC
HCT: 27.3 % — ABNORMAL LOW (ref 36.0–46.0)
Hemoglobin: 8.1 g/dL — ABNORMAL LOW (ref 12.0–15.0)
MCH: 32 pg (ref 26.0–34.0)
MCHC: 29.7 g/dL — ABNORMAL LOW (ref 30.0–36.0)
MCV: 107.9 fL — ABNORMAL HIGH (ref 80.0–100.0)
Platelets: 153 10*3/uL (ref 150–400)
RBC: 2.53 MIL/uL — ABNORMAL LOW (ref 3.87–5.11)
RDW: 19.2 % — ABNORMAL HIGH (ref 11.5–15.5)
WBC: 6 10*3/uL (ref 4.0–10.5)
nRBC: 0 % (ref 0.0–0.2)

## 2022-12-30 LAB — RENAL FUNCTION PANEL
Albumin: 2.9 g/dL — ABNORMAL LOW (ref 3.5–5.0)
Anion gap: 7 (ref 5–15)
BUN: 38 mg/dL — ABNORMAL HIGH (ref 8–23)
CO2: 19 mmol/L — ABNORMAL LOW (ref 22–32)
Calcium: 8.3 mg/dL — ABNORMAL LOW (ref 8.9–10.3)
Chloride: 114 mmol/L — ABNORMAL HIGH (ref 98–111)
Creatinine, Ser: 1.59 mg/dL — ABNORMAL HIGH (ref 0.44–1.00)
GFR, Estimated: 32 mL/min — ABNORMAL LOW (ref >60)
Glucose, Bld: 96 mg/dL (ref 70–99)
Phosphorus: 3.2 mg/dL (ref 2.5–4.6)
Potassium: 3.9 mmol/L (ref 3.5–5.1)
Sodium: 140 mmol/L (ref 135–145)

## 2022-12-30 LAB — PROTIME-INR
INR: 3.7 — ABNORMAL HIGH (ref 0.8–1.2)
Prothrombin Time: 36.7 seconds — ABNORMAL HIGH (ref 11.4–15.2)

## 2022-12-30 MED ORDER — HYDRALAZINE HCL 20 MG/ML IJ SOLN: 10.0000 mg | Freq: Four times a day (QID) | INTRAMUSCULAR | Status: DC | PRN

## 2022-12-30 MED ORDER — FUROSEMIDE 20 MG PO TABS
20.0000 mg | ORAL_TABLET | Freq: Once | ORAL | Status: AC
  Administered 2022-12-30: 20 mg via ORAL
  Filled 2022-12-30: qty 1

## 2022-12-30 MED ORDER — LABETALOL HCL 5 MG/ML IV SOLN
10.0000 mg | INTRAVENOUS | Status: DC | PRN
  Administered 2022-12-31: 10 mg via INTRAVENOUS
  Filled 2022-12-30: qty 4

## 2022-12-30 MED ORDER — FUROSEMIDE 40 MG PO TABS
40.0000 mg | ORAL_TABLET | Freq: Every day | ORAL | Status: DC
  Administered 2022-12-31: 40 mg via ORAL
  Filled 2022-12-30: qty 1

## 2022-12-30 MED ORDER — WARFARIN SODIUM 1 MG PO TABS
1.0000 mg | ORAL_TABLET | Freq: Once | ORAL | Status: AC
  Administered 2022-12-30: 1 mg via ORAL
  Filled 2022-12-30: qty 1

## 2022-12-30 NOTE — Progress Notes (Signed)
[place] for warfarin  Indication: atrial fibrillation and aortic valve replacement  Allergies  Allergen Reactions   Augmentin [Amoxicillin-Pot Clavulanate] Diarrhea and Nausea And Vomiting   Ciprofloxacin Diarrhea, Nausea Only and Other (See Comments)    Stomach ache   Codeine     Insomnia, anxiety   Coreg [Carvedilol] Other (See Comments)    Terrible cramping in the feet and had a lot of bowel movements, but not diarrhea   Diflucan [Fluconazole] Diarrhea   Levofloxacin In D5w     Vomiting, diarrhea, insomnia   Losartan Swelling and Other (See Comments)    Patient doesn't recall site of swelling   Privigen [Immune Globulin (Human)] Other (See Comments)    Severe/excruciating pain   Sulfamethoxazole-Trimethoprim Diarrhea, Nausea Only and Other (See Comments)    Stomach upset   Verapamil Hives   Zetia [Ezetimibe] Other (See Comments)    Reaction not recalled   Zocor [Simvastatin - High Dose] Other (See Comments)    Reaction not recalled   Gatifloxacin Rash and Other (See Comments)    Redness to skin around eye    Patient Measurements: Height: 4\' 10"  (147.3 cm) Weight: 50 kg (110 lb 3.7 oz) IBW/kg (Calculated) : 40.9  Vital Signs: Temp: 98.4 F (36.9 C) (03/23 0523) Temp Source: Oral (03/23 0523) BP: 158/67 (03/23 0832) Pulse Rate: 90 (03/23 0833)  Labs: Recent Labs    12/28/22 0525 12/29/22 0422 12/30/22 0517  HGB 8.4*  --  8.1*  HCT 28.0*  --  27.3*  PLT 175  --  153  LABPROT 56.8* 49.9* 36.7*  INR 6.6* 5.5* 3.7*  CREATININE 1.73*  --  1.59*    Estimated Creatinine Clearance: 18.8 mL/min (A) (by C-G formula based on SCr of 1.59 mg/dL (H)).  Assessment: 82 year old female with history afib, CAD s/p CABG and mechanical AVR on coumadin prior to admit. INR was at upper end of goal on admit at 3.5, she was last seen in [place] clinic on 3/15 with an INR of 4.4.   Prior to admit warfarin dose was 5mg  daily.  3/23: INR  trending down after holding warfarin x 3 days. Note higher goal range for mechanical valve. Hgb low but stable. Patients appetite has improved and no further bleeding noted at IV site. Will cautiously resume warfarin today, noting potential drug interaction with metronidazole  Goal of Therapy:  INR goal 2.5-3.5 Monitor platelets by anticoagulation protocol: Yes   Plan:  Warfarin 1 mg po PT/INR daily Monitor for S/S of bleeding  Lorenso Courier, PharmD Clinical Pharmacist 12/30/2022 9:15 AM

## 2022-12-30 NOTE — Progress Notes (Addendum)
PROGRESS NOTE    [name]  [id number] DOB: 09-10-1941 DOA: 12/24/2022 PCP: [name], MD    Brief Narrative:   [name] is a 82 y.o. female with past medical history significant for paroxysmal atrial fibrillation on anticoagulation, CAD s/p CABG, AS s/p mechanical AVR on Coumadin, anemia of chronic medical/renal disease, iron deficiency, ANCA vasculitis currently not on treatment, PAD s/p stent, pulmonary HTN, type 2 diabetes mellitus, HTN, HLD, history of urinary retention who presented to [facility], ED on 3/17 with abdominal pain associated with nausea/vomiting.  Patient reported dark stool but is on iron supplementation outpatient.  In the ED patient was noted to be febrile with temperature 102.0 F, WBC 15, hemoglobin 7, INR 3.5, creatinine 2.8.  CT abdomen/pelvis notable for wall thickening involving the entire colon and duodenum, interstitial pulmonary edema.  Recent admission for similar presentation, underwent endoscopy which was negative for ulcers.  Additionally prior admission for CHF exacerbation.  Assessment & Plan:   Acute on chronic anemia Concern for GI bleed Patient with history of anemia of chronic medical disease, iron deficiency who presented with a hemoglobin of 7.  Patient was transfused 1 unit PRBC.  Patient was seen by gastroenterology and patient declined endoscopic evaluation.  GI okay to resume Coumadin on 12/25/22 12/30/22 --Hgb  8.9>7.1>>9.2>9.1>9.3>>8.7>>8.4>>8.1 -INR is supratherapeutic watch closely for further bleeding (INR 3.2 >>5.3 >>6.6>5.5>>3.7 -No obvious bleeding noted at this time however given that Hgb is drifting down and ionized trending up patient remains high risk for bleeding -Continue Protonix and Pepcid 12/30/22 -Patient reports black stools x 2 episodes today--??  GI bleed -INR trending down, Hgb  drifting down--- watch closely -Pharmacy helping to manage Coumadin  Hypertensive urgency/Hx Essential hypertension Home  regimen includes amlodipine 10 mg p.o. daily, metoprolol succinate 12.5 mg p.o. daily, hydralazine 100 mg p.o. 3 times daily. -- Amlodipine 10 mg p.o. daily -- Metoprolol succinate 12.5 g p.o. daily -Continue hydralazine 100 mg p.o. 3 times daily -Changed clonidine patch to 0.2 mg from 0.1 -Use labetalol as needed elevated BP  Sepsis, POA Colitis Patient presenting with abdominal pain associated with fever of 102.0 F, WBC count of 15.  Previous history of C. difficile.   -Diarrhea this admission to allow for C. difficile testing - CT abdomen/pelvis with mild diffuse colonic wall thickening with edema second/third portion of the duodenum concerning for colitis.  -GI consult appreciated recommends 10-day course of antibiotics for colitis. -Leukocytosis has resolved -- Ceftriaxone 2 g IV q24h -- Metronidazole 500 mg IV q12h -Continue fentanyl patch 25 micrograms every 72 hours -- Fentanyl 12.5 mcg IV every 2 hours as needed severe breakthrough pain -- Lidocaine patch -- Supportive care, antiemetics with Zofran, Compazine as needed  Acute renal failure on CKD stage IV Baseline creatinine 2.2-2.3.   Creatinine elevated on admission to 2.8.   -Renal function improving after transfusion of PRBC and IV fluids --- Cr 2.8>>1.78>> 1.73 - renally adjust medications, avoid nephrotoxic agents / dehydration  / hypotension  Recurrent urinary retention Foley catheter was placed on admission due to urinary retention, attempted a voiding trial on 3/18 unsuccessful with recurrent urinary retention of 400 mL this morning. -- Foley catheter replaced 12/26/22 -Flomax contraindicated due to sulfa allergy -Keep Foley in and do voiding trial as outpatient with urology team  Paroxysmal atrial fibrillation Mechanical AVR on Coumadin Coumadin was initially held due to concern for GI bleed as above, evaluated by gastroenterology, no plans for repeat endoscopy this hospitalization and  as per GI team okay to  resume Coumadin . --Continue Coumadin, pharmacy consulted for dosing/monitoring -Please see #1 above for Coumadin management patient has a mechanical aortic valve -Continue metoprolol for A-fib rate control  Chronic diastolic congestive heart failure, compensated -- -Restart Lasix at 40 mg daily -- Strict I's and O's and daily weights  CAD s/p CABG/PAD -- Continue statin -- Hold aspirin for concern of GI bleed as above for now  Pulmonary hypertension --Holding tadalafil due to renal function  Anxiety/depression -- Zoloft 25 mg p.o. daily -- Xanax 0.5 mg p.o. nightly as needed anxiety  Type 2 diabetes mellitus Hemoglobin A1c 5.6 at 6 12/24/2022, well-controlled.   -Use Novolog/Humalog Sliding scale insulin with Accu-Cheks/Fingersticks as ordered   GERD with superimposed -Acute GI bleed -- Protonix 40 mg bid  -- Pepcid 20 mg bid  Generalized weakness and deconditioning--- physical therapist recommends SNF rehab, however patient is hoping to go home with home health services  Social/ethics--- DNR/DNI,  -Multiple comorbid conditions  DVT prophylaxis: SCDs Start: 12/24/22 1431/Coumadin    Code Status: DNR Family Communication: Previously discussed with patient's son  Disposition Plan: Physical therapy recommends SNF rehab, patient is hoping to go home with home health services Level of care: Telemetry Status is: Inpatient  Consultants:  Gastroenterology Palliative care  Procedures:  None  Antimicrobials:  Ceftriaxone 3/17>> Metronidazole 3/16>> Cefepime 3/16 - 3/16   Subjective: - -Reports couple of episodes of black stools -No abdominal pain no chest pains no dizziness  Objective: Vitals:   12/30/22 0832 12/30/22 0833 12/30/22 1239 12/30/22 1309  BP: (!) 158/67  (!) 156/76 (!) 144/52  Pulse:  90 80 81  Resp:   18 17  Temp:   98.5 F (36.9 C) 98.7 F (37.1 C)  TempSrc:   Oral Oral  SpO2:   94% 100%  Weight:      Height:        Intake/Output Summary  (Last 24 hours) at 12/30/2022 1841 Last data filed at 12/30/2022 1300 Gross per 24 hour  Intake 480 ml  Output 150 ml  Net 330 ml   Filed Weights   12/27/22 0500 12/28/22 0606 12/29/22 0521  Weight: 50.5 kg 49.4 kg 50 kg   Physical Exam  Gen:- Awake Alert, in no acute distress  HEENT:- [place].AT, No sclera icterus Neck-Supple Neck,No JVD,.  Lungs-  CTAB , fair air movement bilaterally  CV- S1, S2 normal, irregular, metallic click  abd-  +ve B.Sounds, Abd Soft, No tenderness,    Extremity/Skin:- No significant edema,   good pedal pulses  Psych-affect is appropriate, oriented x3 Neuro-generalized weakness , no new focal deficits, no tremors GU- foley in situ with clear urine   Data Reviewed: I have personally reviewed following labs and imaging studies  CBC: Recent Labs  Lab 12/25/22 0314 12/25/22 0716 12/26/22 0414 12/27/22 0509 12/28/22 0525 12/30/22 0517  WBC 9.9  --  9.7 7.5 7.4 6.0  HGB 9.2* 9.1* 9.3* 8.7* 8.4* 8.1*  HCT 29.2* 28.5* 29.2* 28.0* 28.0* 27.3*  MCV 97.7  --  98.6 101.1* 104.1* 107.9*  PLT 139*  --  176 166 175 0000000   Basic Metabolic Panel: Recent Labs  Lab 12/25/22 0314 12/26/22 0414 12/26/22 0737 12/27/22 0509 12/28/22 0525 12/30/22 0517  NA 135 140  --  143 145 140  K 3.5 3.3*  --  3.4* 4.3 3.9  CL 105 109  --  114* 117* 114*  CO2 19* 19*  --  21* 20* 19*  GLUCOSE 104* 271*  --  178* 146* 96  BUN 92* 75*  --  61* 53* 38*  CREATININE 2.06* 1.78*  --  1.63* 1.73* 1.59*  CALCIUM 8.8* 9.3  --  9.2 9.0 8.3*  MG  --   --  1.9 2.1  --   --   PHOS  --   --   --   --  2.9 3.2   GFR: Estimated Creatinine Clearance: 18.8 mL/min (A) (by C-G formula based on SCr of 1.59 mg/dL (H)). Liver Function Tests: Recent Labs  Lab 12/24/22 0420 12/27/22 0509 12/28/22 0525 12/30/22 0517  AST 32 36  --   --   ALT 15 21  --   --   ALKPHOS 135* 105  --   --   BILITOT 1.2 0.4  --   --   PROT 6.1* 5.7*  --   --   ALBUMIN 3.4* 3.0* 3.1* 2.9*   Recent Labs   Lab 12/24/22 0420  LIPASE 39   Coagulation Profile: Recent Labs  Lab 12/26/22 0414 12/27/22 0509 12/28/22 0525 12/29/22 0422 12/30/22 0517  INR 3.2* 5.3* 6.6* 5.5* 3.7*   CBG: Recent Labs  Lab 12/29/22 1611 12/29/22 2047 12/30/22 0734 12/30/22 1113 12/30/22 1613  GLUCAP 125* 144* 115* 262* 158*   Sepsis Labs: Recent Labs  Lab 12/24/22 0420 12/24/22 0735  LATICACIDVEN 1.9 1.0    Recent Results (from the past 240 hour(s))  Resp panel by RT-PCR (RSV, Flu A&B, Covid) Anterior Nasal Swab     Status: None   Collection Time: 12/24/22  5:11 AM   Specimen: Anterior Nasal Swab  Result Value Ref Range Status   SARS Coronavirus 2 by RT PCR NEGATIVE NEGATIVE Final    Comment: (NOTE) SARS-CoV-2 target nucleic acids are NOT DETECTED.  The SARS-CoV-2 RNA is generally detectable in upper respiratory specimens during the acute phase of infection. The lowest concentration of SARS-CoV-2 viral copies this assay can detect is 138 copies/mL. A negative result does not preclude SARS-Cov-2 infection and should not be used as the sole basis for treatment or other patient management decisions. A negative result may occur with  improper specimen collection/handling, submission of specimen other than nasopharyngeal swab, presence of viral mutation(s) within the areas targeted by this assay, and inadequate number of viral copies(<138 copies/mL). A negative result must be combined with clinical observations, patient history, and epidemiological information. The expected result is Negative.  Fact Sheet for Patients:  EntrepreneurPulse.com.au  Fact Sheet for Healthcare Providers:  IncredibleEmployment.be  This test is no t yet approved or cleared by the [place] FDA and  has been authorized for detection and/or diagnosis of SARS-CoV-2 by FDA under an Emergency Use Authorization (EUA). This EUA will remain  in effect (meaning this test can be  used) for the duration of the COVID-19 declaration under Section 564(b)(1) of the Act, 21 U.S.C.section 360bbb-3(b)(1), unless the authorization is terminated  or revoked sooner.       Influenza A by PCR NEGATIVE NEGATIVE Final   Influenza B by PCR NEGATIVE NEGATIVE Final    Comment: (NOTE) The Xpert Xpress SARS-CoV-2/FLU/RSV plus assay is intended as an aid in the diagnosis of influenza from Nasopharyngeal swab specimens and should not be used as a sole basis for treatment. Nasal washings and aspirates are unacceptable for Xpert Xpress SARS-CoV-2/FLU/RSV testing.  Fact Sheet for Patients: EntrepreneurPulse.com.au  Fact Sheet for Healthcare Providers: IncredibleEmployment.be  This test is not yet approved or cleared by the [place]  States FDA and has been authorized for detection and/or diagnosis of SARS-CoV-2 by FDA under an Emergency Use Authorization (EUA). This EUA will remain in effect (meaning this test can be used) for the duration of the COVID-19 declaration under Section 564(b)(1) of the Act, 21 U.S.C. section 360bbb-3(b)(1), unless the authorization is terminated or revoked.     Resp Syncytial Virus by PCR NEGATIVE NEGATIVE Final    Comment: (NOTE) Fact Sheet for Patients: EntrepreneurPulse.com.au  Fact Sheet for Healthcare Providers: IncredibleEmployment.be  This test is not yet approved or cleared by the [place] FDA and has been authorized for detection and/or diagnosis of SARS-CoV-2 by FDA under an Emergency Use Authorization (EUA). This EUA will remain in effect (meaning this test can be used) for the duration of the COVID-19 declaration under Section 564(b)(1) of the Act, 21 U.S.C. section 360bbb-3(b)(1), unless the authorization is terminated or revoked.  Performed at [facility], [address] [place], [place] [address]   Blood culture (routine x 2)     Status: None    Collection Time: 12/24/22  5:15 AM   Specimen: BLOOD LEFT HAND  Result Value Ref Range Status   Specimen Description   Final    BLOOD LEFT HAND BOTTLES DRAWN AEROBIC AND ANAEROBIC   Special Requests Blood Culture adequate volume  Final   Culture   Final    NO GROWTH 5 DAYS Performed at [facility], [address] [place], [place] [address]    Report Status 12/29/2022 FINAL  Final  Blood culture (routine x 2)     Status: None   Collection Time: 12/24/22  5:20 AM   Specimen: Left Antecubital; Blood  Result Value Ref Range Status   Specimen Description   Final    LEFT ANTECUBITAL BOTTLES DRAWN AEROBIC AND ANAEROBIC   Special Requests   Final    Blood Culture results may not be optimal due to an excessive volume of blood received in culture bottles   Culture   Final    NO GROWTH 5 DAYS Performed at [facility], [address], [place], [place] [address]    Report Status 12/29/2022 FINAL  Final  MRSA Next Gen by PCR, Nasal     Status: None   Collection Time: 12/24/22  6:06 PM   Specimen: Nasal Mucosa; Nasal Swab  Result Value Ref Range Status   MRSA by PCR Next Gen NOT DETECTED NOT DETECTED Final    Comment: (NOTE) The GeneXpert MRSA Assay (FDA approved for NASAL specimens only), is one component of a comprehensive MRSA colonization surveillance program. It is not intended to diagnose MRSA infection nor to guide or monitor treatment for MRSA infections. Test performance is not FDA approved in patients less than 53 years old. Performed at [facility], [address], [place], [place] [address]     Scheduled Meds:  allopurinol  100 mg Oral Daily   amLODipine  10 mg Oral Daily   atorvastatin  80 mg Oral q1800   Chlorhexidine Gluconate Cloth  6 each Topical Daily   cholecalciferol  2,000 Units Oral Q lunch   cloNIDine  0.2 mg Transdermal Weekly   cyanocobalamin  1,000 mcg Oral Q lunch   famotidine  20 mg Oral Daily   fentaNYL  1 patch Transdermal Q72H   hydrALAZINE  100 mg  Oral Q8H   insulin aspart  0-6 Units Subcutaneous TID WC   lidocaine  1 patch Transdermal Q24H   metoprolol succinate  12.5 mg Oral Daily  multivitamin  1 tablet Oral QHS   ondansetron (ZOFRAN) IV  4 mg Intravenous Q8H   pantoprazole  40 mg Oral BID   polyethylene glycol  17 g Oral Daily   senna-docusate  2 tablet Oral QHS   sertraline  25 mg Oral Daily   sodium chloride flush  3 mL Intravenous Q12H   Warfarin - Pharmacist Dosing Inpatient   Does not apply q1600   Continuous Infusions:  cefTRIAXone (ROCEPHIN)  IV 2 g (12/30/22 1315)   metronidazole 500 mg (12/30/22 0837)    LOS: 6 days   Roxan Hockey, MD [facility] Available via Epic secure chat 7am-7pm After these hours, please refer to coverage provider listed on [email or web address] 12/30/2022, 6:41 PM

## 2022-12-30 NOTE — Progress Notes (Signed)
Held BP meds for pt. Due to pt.'s BP. MD notified and suggested to hold due to low diastolic number. Patient requested some medication for anxiety to help her sleep. Meds given. During roundings, pt. Remained in bed with eyes closed comfortably. Continued to monitor patient throughout shift and night.   Patient did not complain of any pain, nor had any concerns during morning medication and vital sign monitoring. Patient states, "I'm just going to wait for breakfast to come around."

## 2022-12-30 NOTE — Progress Notes (Signed)
Pharmacist Ricci Barker was notified of dark stools almost black when questioning if patient had any signs of bleeding.

## 2022-12-31 DIAGNOSIS — K922 Gastrointestinal hemorrhage, unspecified: Secondary | ICD-10-CM | POA: Diagnosis not present

## 2022-12-31 LAB — CBC
HCT: 29.3 % — ABNORMAL LOW (ref 36.0–46.0)
Hemoglobin: 8.9 g/dL — ABNORMAL LOW (ref 12.0–15.0)
MCH: 31.4 pg (ref 26.0–34.0)
MCHC: 30.4 g/dL (ref 30.0–36.0)
MCV: 103.5 fL — ABNORMAL HIGH (ref 80.0–100.0)
Platelets: 168 10*3/uL (ref 150–400)
RBC: 2.83 MIL/uL — ABNORMAL LOW (ref 3.87–5.11)
RDW: 19 % — ABNORMAL HIGH (ref 11.5–15.5)
WBC: 6.7 10*3/uL (ref 4.0–10.5)
nRBC: 0 % (ref 0.0–0.2)

## 2022-12-31 LAB — BASIC METABOLIC PANEL
Anion gap: 9 (ref 5–15)
BUN: 31 mg/dL — ABNORMAL HIGH (ref 8–23)
CO2: 18 mmol/L — ABNORMAL LOW (ref 22–32)
Calcium: 8.4 mg/dL — ABNORMAL LOW (ref 8.9–10.3)
Chloride: 112 mmol/L — ABNORMAL HIGH (ref 98–111)
Creatinine, Ser: 1.49 mg/dL — ABNORMAL HIGH (ref 0.44–1.00)
GFR, Estimated: 35 mL/min — ABNORMAL LOW (ref >60)
Glucose, Bld: 154 mg/dL — ABNORMAL HIGH (ref 70–99)
Potassium: 3.8 mmol/L (ref 3.5–5.1)
Sodium: 139 mmol/L (ref 135–145)

## 2022-12-31 LAB — GLUCOSE, CAPILLARY
Glucose-Capillary: 140 mg/dL — ABNORMAL HIGH (ref 70–99)
Glucose-Capillary: 151 mg/dL — ABNORMAL HIGH (ref 70–99)

## 2022-12-31 LAB — PROTIME-INR
INR: 2.6 — ABNORMAL HIGH (ref 0.8–1.2)
Prothrombin Time: 27.9 seconds — ABNORMAL HIGH (ref 11.4–15.2)

## 2022-12-31 MED ORDER — HYDRALAZINE HCL 100 MG PO TABS: 100.0000 mg | ORAL_TABLET | Freq: Three times a day (TID) | ORAL | 3 refills | Status: DC

## 2022-12-31 MED ORDER — METOPROLOL SUCCINATE ER 25 MG PO TB24: 25.0000 mg | ORAL_TABLET | Freq: Every day | ORAL | 3 refills | Status: DC

## 2022-12-31 MED ORDER — FUROSEMIDE 40 MG PO TABS: 20.0000 mg | ORAL_TABLET | ORAL | 3 refills | Status: DC

## 2022-12-31 MED ORDER — SENNOSIDES-DOCUSATE SODIUM 8.6-50 MG PO TABS: 2.0000 | ORAL_TABLET | Freq: Every day | ORAL | 2 refills | Status: DC

## 2022-12-31 MED ORDER — METRONIDAZOLE 500 MG PO TABS: 500.0000 mg | ORAL_TABLET | Freq: Two times a day (BID) | ORAL | 0 refills | Status: AC

## 2022-12-31 MED ORDER — FENTANYL 25 MCG/HR TD PT72: 1.0000 | MEDICATED_PATCH | TRANSDERMAL | 0 refills | Status: DC

## 2022-12-31 MED ORDER — AMLODIPINE BESYLATE 10 MG PO TABS: 10.0000 mg | ORAL_TABLET | Freq: Every day | ORAL | 3 refills | Status: DC

## 2022-12-31 MED ORDER — PANTOPRAZOLE SODIUM 40 MG PO TBEC: 40.0000 mg | DELAYED_RELEASE_TABLET | Freq: Two times a day (BID) | ORAL | 2 refills | Status: DC

## 2022-12-31 MED ORDER — WARFARIN SODIUM 5 MG PO TABS: 5.0000 mg | ORAL_TABLET | ORAL | Status: DC

## 2022-12-31 MED ORDER — WARFARIN SODIUM 2.5 MG PO TABS
5.0000 mg | ORAL_TABLET | Freq: Once | ORAL | Status: AC
  Administered 2022-12-31: 5 mg via ORAL
  Filled 2022-12-31: qty 2

## 2022-12-31 MED ORDER — LIDOCAINE 5 % EX PTCH: 1.0000 | MEDICATED_PATCH | CUTANEOUS | 0 refills | Status: DC

## 2022-12-31 MED ORDER — CLONIDINE HCL 0.3 MG/24HR TD PTWK: 0.3000 mg | MEDICATED_PATCH | TRANSDERMAL | 11 refills | Status: DC

## 2022-12-31 MED ORDER — CEFDINIR 300 MG PO CAPS: 300.0000 mg | ORAL_CAPSULE | Freq: Every day | ORAL | 0 refills | Status: AC

## 2022-12-31 NOTE — Discharge Instructions (Signed)
-   1)Avoid ibuprofen/Advil/Aleve/Motrin/Goody Powders/Naproxen/BC powders/Meloxicam/Diclofenac/Indomethacin and other Nonsteroidal anti-inflammatory medications as these will make you more likely to bleed and can cause stomach ulcers, can also cause Kidney problems.   2)Change Lasix/Furosemide to 1 tablet (40 mg) every morning and 1/2 Tab (20 mg ) every evening  3)Change Coumadin as follows:- Take 1 tablet 5mg  on Tuesday/Thursday/Sunday and 1/2 Tab (2.5 mg) on M/W/F/Saturday Recheck PT/ INR  within a week.

## 2022-12-31 NOTE — TOC Transition Note (Signed)
Transition of Care [facility]) - CM/SW Discharge Note   Patient Details  Name: [name] MRN: [id number] Date of Birth: 08/29/1941  Transition of Care [facility] CM/SW Contact:  Leo Rod, LCSW Phone Number: 12/31/2022, 8:47 AM   Clinical Narrative:     Pt discharge home. PT,OT SLP orders in place. Contacted [facility].       Patient Goals and CMS Choice CMS Medicare.gov Compare Post Acute Care list provided to:: Patient Choice offered to / list presented to : Patient  Discharge Placement                         Discharge Plan and Services Additional resources added to the After Visit Summary for   In-house Referral: Clinical Social Work Discharge Planning Services: CM Consult Post Acute Care Choice: Home Health                               Social Determinants of Health (SDOH) Interventions SDOH Screenings   Food Insecurity: No Food Insecurity (12/25/2022)  Housing: Low Risk  (12/25/2022)  Transportation Needs: No Transportation Needs (12/25/2022)  Utilities: Not At Risk (12/25/2022)  Alcohol Screen: Low Risk  (02/13/2022)  Depression (PHQ2-9): Low Risk  (02/13/2022)  Financial Resource Strain: Low Risk  (02/28/2022)  Physical Activity: Inactive (02/28/2022)  Social Connections: Moderately Integrated (06/22/2022)  Stress: No Stress Concern Present (02/28/2022)  Tobacco Use: Medium Risk (12/26/2022)     Readmission Risk Interventions    12/25/2022    9:36 AM 08/30/2022   11:42 AM 04/20/2022   12:02 PM  Readmission Risk Prevention Plan  Transportation Screening Complete Complete Complete  Medication Review Press photographer) Complete Complete Complete  PCP or Specialist appointment within 3-5 days of discharge   Complete  HRI or [place] Complete Complete Complete  SW Recovery Care/Counseling Consult Complete Complete Complete  Palliative Care Screening Not Applicable Not Applicable Not [place] Not Applicable Not  Applicable Not Applicable

## 2022-12-31 NOTE — Progress Notes (Signed)
Patient ambulated around 40 ft in the hallway with a walker. Patient's o2 saturation was in between 98-100%. Heart rate was in between 98-122. Patient didn't complain of sob, but did state that she was tired and felt weak. MD Emokpae notified.

## 2022-12-31 NOTE — Progress Notes (Addendum)
   12/31/22 1000  Vitals  Temp 97.7 F (36.5 C)  Temp Source Oral  BP (!) 190/72  MAP (mmHg) 106  Pulse Rate 86  Resp 18  Level of Consciousness  Level of Consciousness Alert  MEWS COLOR  MEWS Score Color Green  Oxygen Therapy  SpO2 99 %  O2 Device Room Air  MEWS Score  MEWS Temp 0  MEWS Systolic 0  MEWS Pulse 0  MEWS RR 0  MEWS LOC 0  MEWS Score 0   MD Emokpae notified. PRN Labetalol given.  BP rechecked 161/72 and hr 82. MD Emokpae notified.

## 2022-12-31 NOTE — Discharge Summary (Signed)
[name], is a 82 y.o. female  DOB 06/17/1941  MRN [id number].  Admission date:  12/24/2022  Admitting Physician  Patrecia Pour, MD  Discharge Date:  12/31/2022   Primary MD  [name], MD  Recommendations for primary care physician for things to follow:   1)Avoid ibuprofen/Advil/Aleve/Motrin/Goody Powders/Naproxen/BC powders/Meloxicam/Diclofenac/Indomethacin and other Nonsteroidal anti-inflammatory medications as these will make you more likely to bleed and can cause stomach ulcers, can also cause Kidney problems.   2)Change Lasix/Furosemide to 1 tablet (40 mg) every morning and 1/2 Tab (20 mg ) every evening  3)Change Coumadin as follows:- Take 1 tablet 5mg  on Tuesday/Thursday/Sunday and 1/2 Tab (2.5 mg) on M/W/F/Saturday Recheck PT/ INR  within a week.   Admission Diagnosis  Melena [K92.1] Coagulopathy ([place]) [D68.9] Acute GI bleeding [K92.2] Severe sepsis ([place]) [A41.9, R65.20] Acute on chronic congestive heart failure, unspecified heart failure type ([place]) [I50.9]   Discharge Diagnosis  Melena [K92.1] Coagulopathy ([place]) [D68.9] Acute GI bleeding [K92.2] Severe sepsis ([place]) [A41.9, R65.20] Acute on chronic congestive heart failure, unspecified heart failure type ([place]) [I50.9]    Principal Problem:   Acute GI bleeding Active Problems:   H/O mechanical aortic valve replacement   Acute on chronic anemia   Acute urinary retention   Chronic diastolic CHF (congestive heart failure) (HCC)   Diabetes mellitus type 2, controlled (HCC)   PAF (paroxysmal atrial fibrillation) (HCC)   Vasculitis, ANCA positive   CKD (chronic kidney disease), stage IV (HCC)   History of Clostridioides difficile colitis   DNR (do not resuscitate)/DNI(Do Not Intubate)   Essential hypertension   Hypercholesteremia   Carotid artery disease (HCC)   Peripheral arterial disease (HCC)   Aortic atherosclerosis  (HCC)   GERD without esophagitis   Chronic atrial fibrillation   Moderate to severe pulmonary hypertension (HCC)   Iron deficiency anemia   Diabetic retinopathy associated with type 2 diabetes mellitus (HCC)   Colitis   Nausea and vomiting   Hypoalbuminemia due to protein-calorie malnutrition (HCC)   Coagulopathy (HCC)   Melena   Duodenitis   Noninfectious gastroenteritis   CAD, multiple vessel   Protein-calorie malnutrition, severe      Past Medical History:  Diagnosis Date   Aortic atherosclerosis ([place]) 01/23/2018   Atrial fibrillation (HCC)    Benign positional vertigo 10/06/2011   Carotid artery disease (HCC)    Chemotherapy induced neutropenia ([place]) 07/21/2019   Cholelithiasis 01/23/2018   Cholelithiasis 01/23/2018   Chronic anticoagulation    Colovesical fistula, ruled out 04/14/2022   Coronary artery disease    status post coronary artery bypass grafting times 07/10/2004   Diabetes mellitus    Hypercholesteremia    Hypertension    Mechanical heart valve present    H. aortic valve replacement at the time of bypass surgery October 2005   Moderate to severe pulmonary hypertension (HCC)    Peripheral arterial disease (HCC)    history of left common iliac artery PTA and stenting for a chronic total occlusion 08/26/01   S/P  cholecystectomy 03/06/2018    Past Surgical History:  Procedure Laterality Date   anterior repair  2009   AORTIC VALVE REPLACEMENT  2005   Dr. Cyndia Bent   CARDIAC CATHETERIZATION  11/10/2004   40% right common illiac, 70% in stent restenosis of distal left common illiac,    CARDIAC CATHETERIZATION  05/18/2004   LAD 50-70% midstenosis, RCA dominant w/50% stenosis, 50% Right common Illiac artery ostial stenosis, 90% in stent restenosis within midportion of left common illiac stent   Carotid Duplex  03/12/2012   RSA-elev. velocities suggestive of a 50-69% diameter reduction, Right&Left Bulb/Prox ICA-mild-mod.fibrous plaqueelevating Velocities abnormal  study.   CHOLECYSTECTOMY N/A 03/01/2018   Procedure: LAPAROSCOPIC CHOLECYSTECTOMY;  Surgeon: Judeth Horn, MD;  Location: [facility];  Service: General;  Laterality: N/A;   COLONOSCOPY WITH PROPOFOL N/A 01/22/2018   Procedure: COLONOSCOPY WITH PROPOFOL;  Surgeon: Wilford Corner, MD;  Location: [facility];  Service: Endoscopy;  Laterality: N/A;   ESOPHAGOGASTRODUODENOSCOPY N/A 07/13/2022   Procedure: ESOPHAGOGASTRODUODENOSCOPY (EGD);  Surgeon: [name], MD;  Location: [facility];  Service: Gastroenterology;  Laterality: N/A;   ESOPHAGOGASTRODUODENOSCOPY (EGD) WITH PROPOFOL N/A 01/22/2018   Procedure: ESOPHAGOGASTRODUODENOSCOPY (EGD) WITH PROPOFOL;  Surgeon: Wilford Corner, MD;  Location: [facility];  Service: Endoscopy;  Laterality: N/A;   ESOPHAGOGASTRODUODENOSCOPY (EGD) WITH PROPOFOL N/A 11/21/2018   Procedure: ESOPHAGOGASTRODUODENOSCOPY (EGD) WITH PROPOFOL;  Surgeon: [name], MD;  Location: [facility];  Service: Gastroenterology;  Laterality: N/A;   ESOPHAGOGASTRODUODENOSCOPY (EGD) WITH PROPOFOL Left 02/27/2019   Procedure: ESOPHAGOGASTRODUODENOSCOPY (EGD) WITH PROPOFOL;  Surgeon: Arta Silence, MD;  Location: [facility];  Service: Endoscopy;  Laterality: Left;   GIVENS CAPSULE STUDY N/A 11/21/2018   Procedure: GIVENS CAPSULE STUDY;  Surgeon: [name], MD;  Location: [facility];  Service: Gastroenterology;  Laterality: N/A;  To be deployed during EGD   Lower Ext. Duplex  03/12/2012   Right Proximal CIA- vessel narrowing w/elevated velocities 0-49% diameter reduction. Right SFA-mild mixed density plaque throughout vessel.   NM MYOCAR PERF WALL MOTION  05/19/2010   protocol: Persantine, post stress EF 65%, negative for ischemia, low risk scan   RIGHT HEART CATH N/A 06/27/2018   Procedure: RIGHT HEART CATH;  Surgeon: [name], MD;  Location: [facility];  Service: Cardiovascular;  Laterality: N/A;   TOTAL ABDOMINAL HYSTERECTOMY W/ BILATERAL SALPINGOOPHORECTOMY   1989   TRANSTHORACIC ECHOCARDIOGRAM  08/29/2012   Moderately calcified annulus of mitral valve, moderate regurg. of both mitral valve and tricuspid valve.      HPI  from the history and physical done on the day of admission:   HPI: SHARLI DILLAHUNT is an 82 y.o. female with a history of AFib, CAD s/p CABG and mechanical AVR on coumadin, chronic anemia of CKD and iron deficiency, ANCA vasculitis not currently on treatment, PAD s/p stenting, pulmonary HTN, IDT2DM, HTN, HLD, urinary retention who presented to the ED this morning with abrupt onset of abdominal discomfort, moaning. She had no complaints the day before, but had 4-5 soft stools. Stools have been dark but this is unchanged from baseline attributed to iron supplementation. She has bruises on her arms which is normal, no other bleeding. She's had nausea without vomiting. Took coumadin yesterday, not today. No dyspnea or chest pain currently.    She arrived to the ED by EMS who reported dry heaves, had dark stool in the ED that was +FOBT. Was febrile to 102.38F with WBC 15.2k, hgb 8.9g/dl down from 9.8g/dl and INR 3.5, down from 4.4 a couple  days prior. BUN 103, SCr 2.84. CXR and urinalysis did not suggest infection. CT abd/pelvis demonstrated wall thickening involving the entire colon and duodenum. Interstitial pulmonary edema suggested as well.    She's had relatively recent admissions and work up for similar presentations with endoscopy not revealing any ulcers or culprit lesions. Also admissions for acute CHF with outpatient palliative care recommended at discharge.    Due to concern for sepsis, broad antibiotics and IV fluid bolus and infusion were administered with blood cultures sent. Protonix infusion initiated and GI consulted for acute GI bleed and hospitalists consulted for admission.   Review of Systems: As mentioned in the history of present illness. All other systems reviewed and are negative.    Hospital Course:   Assessment  and Plan:   1)Acute on chronic anemia Concern for GI bleed Patient with history of anemia of chronic medical disease, iron deficiency who presented with a hemoglobin of 7.  Patient was transfused 1 unit PRBC.  Patient was seen by gastroenterology and patient declined endoscopic evaluation.  GI okay to resume Coumadin on 12/25/22 12/31/22 --Hgb  8.9>7.1>>9.2>9.1>9.3>>8.7>>8.4>>8.1>.8.9  -INR is supratherapeutic watch closely for further bleeding (INR 3.2 >>5.3 >>6.6>5.5>>3.7>>2.6 -No obvious bleeding noted at this time however given that Hgb is drifting down and ionized trending up patient remains high risk for bleeding -Continue Protonix and Pepcid -Pharmacy consult for Coumadin management appreciated-- Change Coumadin as follows:- Take 1 tablet 5mg  on Tuesday/Thursday/Sunday and 1/2 Tab (2.5 mg) on M/W/F/Saturday Recheck PT/ INR  within a week -Patient had dark stools on 12/30/2022, no further concerns about GI bleed at this time Hgb starting to trend up, and INR is trending down -INR may trend up again while on Flagyl so warfarin dose may need to be adjusted  Hypertensive Urgency/Hx Essential hypertension Home regimen includes amlodipine 10 mg p.o. daily, metoprolol succinate 25 mg p.o. daily, hydralazine 100 mg p.o. 3 times daily. -- Amlodipine 10 mg p.o. daily -- Metoprolol succinate 25 g p.o. daily -Continue hydralazine 100 mg  3 times daily -Change clonidine patch to 0.3 mg every Sunday -Unable to use isosorbide as patient is on tadalafil for pulmonary hypertension    Sepsis secondary to Colitis- POA Patient presenting with abdominal pain associated with fever of 102.0 F, WBC count of 15.  Previous history of C. difficile.   -Diarrhea this admission to allow for C. difficile testing - CT abdomen/pelvis with mild diffuse colonic wall thickening with edema second/third portion of the duodenum concerning for colitis.  -GI consult appreciated recommends 10-day course of antibiotics for  colitis. -Leukocytosis has resolved -- Patient was treated with IV Rocephin and IV Flagy -Okay to discharge on oral Omnicef and Flagyl -Abdominal pain and stool issues have improved -Blood cultures NGTD   Acute renal failure on CKD stage IV Baseline creatinine 2.2-2.3.   Creatinine elevated on admission to 2.8.   -Renal function improved after transfusion of PRBC and IV fluids --- Cr 2.8>>1.78>> 1.73>>1.49 - renally adjust medications, avoid nephrotoxic agents / dehydration  / hypotension   Recurrent urinary retention -Patient required indwelling Foley catheter initially,, she failed voiding trial on 12/25/2022, it was replaced on 12/26/2022 -She eventually passed a voiding trial and has been voiding without significant concerns at this time -Unable to use Flomax due to sulfa allergy -Consider outpatient follow-up with urology if voiding issues occurs again   Paroxysmal atrial fibrillation Mechanical AVR on Coumadin Coumadin was initially held due to concern for GI bleed as above, evaluated by gastroenterology,  no plans for repeat endoscopy this hospitalization and as per GI team okay to resume Coumadin . --Continue Coumadin, pharmacy consulted for dosing/monitoring -Please see #1 above for Coumadin management patient has a mechanical aortic valve -Continue metoprolol for A-fib rate control   Chronic diastolic congestive heart failure, compensated -- --Appears euvolemic, oral Lasix dosage adjusted please see discharge instructions   CAD s/p CABG/PAD -- Continue atorvastatin -- Hold aspirin for concern of GI bleed as above for now,  -Patient is already on Coumadin for A-fib and mechanical aortic valve -Continue metoprolol   Pulmonary hypertension -Restart tadalafil     Anxiety/depression -- Zoloft 25 mg p.o. daily -- Xanax 0.5 mg p.o. nightly as needed anxiety   Type 2 diabetes mellitus Hemoglobin A1c 5.6 at 6 12/24/2022, well-controlled.   -Continue sliding scale insulin    GERD with superimposed Acute GI bleed -- Protonix 40 mg bid  -- Pepcid 20 mg bid   Generalized weakness and deconditioning--- physical therapist recommends SNF rehab, however patient is hoping to go home with home health services -Patient and son refused SNF rehab at this time they would like to go home with home services which has been ordered   Social/ethics--- DNR/DNI,  -Multiple comorbid conditions impacting overall prognosis  Discharge Condition: Stable,  Follow UP   Follow-up Information     [facility].   Specialty: Home Health Services Why: Home Health will call you to set up your visit. Contact information: [address] [place] [address] [phone]                 Consults obtained -GI  Diet and Activity recommendation:  As advised  Discharge Instructions    Discharge Instructions     Call MD for:  difficulty breathing, headache or visual disturbances   Complete by: As directed    Call MD for:  persistant dizziness or light-headedness   Complete by: As directed    Call MD for:  persistant nausea and vomiting   Complete by: As directed    Call MD for:  temperature >100.4   Complete by: As directed    Diet - low sodium heart healthy   Complete by: As directed    Discharge instructions   Complete by: As directed    1)Avoid ibuprofen/Advil/Aleve/Motrin/Goody Powders/Naproxen/BC powders/Meloxicam/Diclofenac/Indomethacin and other Nonsteroidal anti-inflammatory medications as these will make you more likely to bleed and can cause stomach ulcers, can also cause Kidney problems.   2)Change Lasix/Furosemide to 1 tablet (40 mg) every morning and 1/2 Tab (20 mg ) every evening  3)Change Coumadin as follows:- Take 1 tablet 5mg  on Tuesday/Thursday/Sunday and 1/2 Tab (2.5 mg) on M/W/F/Saturday Recheck PT/ INR  within a week.   Increase activity slowly   Complete by: As directed        Discharge Medications      Allergies as of 12/31/2022       Reactions   Augmentin [amoxicillin-pot Clavulanate] Diarrhea, Nausea And Vomiting   Ciprofloxacin Diarrhea, Nausea Only, Other (See Comments)   Stomach ache   Codeine    Insomnia, anxiety   Coreg [carvedilol] Other (See Comments)   Terrible cramping in the feet and had a lot of bowel movements, but not diarrhea   Diflucan [fluconazole] Diarrhea   Levofloxacin In D5w    Vomiting, diarrhea, insomnia   Losartan Swelling, Other (See Comments)   Patient doesn't recall site of swelling   Privigen [immune Globulin (human)] Other (See Comments)  Severe/excruciating pain   Sulfamethoxazole-trimethoprim Diarrhea, Nausea Only, Other (See Comments)   Stomach upset   Verapamil Hives   Zetia [ezetimibe] Other (See Comments)   Reaction not recalled   Zocor [simvastatin - High Dose] Other (See Comments)   Reaction not recalled   Gatifloxacin Rash, Other (See Comments)   Redness to skin around eye        Medication List     STOP taking these medications    aspirin 81 MG chewable tablet   feeding supplement (GLUCERNA SHAKE) Liqd       TAKE these medications    acetaminophen 325 MG tablet Commonly known as: TYLENOL Take 325 mg by mouth every 6 (six) hours as needed for mild pain or fever.   allopurinol 100 MG tablet Commonly known as: ZYLOPRIM Take 100 mg by mouth daily.   ALPRAZolam 0.5 MG tablet Commonly known as: XANAX Take 0.5 mg by mouth daily. What changed: Another medication with the same name was removed. Continue taking this medication, and follow the directions you see here.   amLODipine 10 MG tablet Commonly known as: NORVASC Take 1 tablet (10 mg total) by mouth daily.   atorvastatin 80 MG tablet Commonly known as: LIPITOR Take 1 tablet (80 mg total) by mouth daily at 6 PM.   B-D INS SYR ULTRAFINE .3CC/29G 29G X 1/2" 0.3 ML Misc Generic drug: Insulin Syringe-Needle U-100 Use for sliding scale for breakthrough high blood  sugar readings   cefdinir 300 MG capsule Commonly known as: OMNICEF Take 1 capsule (300 mg total) by mouth daily for 5 days.   cloNIDine 0.3 mg/24hr patch Commonly known as: CATAPRES - Dosed in mg/24 hr Place 1 patch (0.3 mg total) onto the skin every Sunday. Start on 12/31/22   famotidine 20 MG tablet Commonly known as: PEPCID Take 20 mg by mouth 2 (two) times daily.   fentaNYL 25 MCG/HR Commonly known as: [place] 1 patch onto the skin every 3 (three) days. Start taking on: January 01, 2023   ferrous sulfate 325 (65 FE) MG tablet Take 1 tablet (325 mg total) by mouth daily with breakfast.   furosemide 40 MG tablet Commonly known as: LASIX Take 0.5-1 tablets (20-40 mg total) by mouth See admin instructions. -1 tablet (40 mg) every morning and 1/2 Tab (20 mg ) Qpm What changed: See the new instructions.   hydrALAZINE 100 MG tablet Commonly known as: APRESOLINE Take 1 tablet (100 mg total) by mouth 3 (three) times daily.   insulin aspart 100 UNIT/ML injection Commonly known as: novoLOG Inject 2-8 Units into the skin 3 (three) times daily with meals. Per sliding scale, If blood sugar is 200 to 250, give 2 units. If blood sugar is 251 to 300, give 4 units. If blood sugar is 301 to 350, give 6 units. If blood sugar is greater than 350, give 8 units and call MD.   lidocaine 5 % Commonly known as: LIDODERM Place 1 patch onto the skin daily. Remove & Discard patch within 12 hours or as directed by MD Start taking on: January 01, 2023   metoprolol succinate 25 MG 24 hr tablet Commonly known as: TOPROL-XL Take 1 tablet (25 mg total) by mouth daily. What changed: how much to take   metroNIDAZOLE 500 MG tablet Commonly known as: FLAGYL Take 1 tablet (500 mg total) by mouth 2 (two) times daily for 5 days.   ondansetron 4 MG disintegrating tablet Commonly known as: ZOFRAN-ODT Take 4 mg by mouth  every 8 (eight) hours as needed for nausea or vomiting.   pantoprazole 40 MG  tablet Commonly known as: PROTONIX Take 1 tablet (40 mg total) by mouth 2 (two) times daily.   senna-docusate 8.6-50 MG tablet Commonly known as: Senokot-S Take 2 tablets by mouth at bedtime.   sertraline 25 MG tablet Commonly known as: ZOLOFT Take 25 mg by mouth daily.   simethicone 80 MG chewable tablet Commonly known as: Gas-X Chew 1 tablet (80 mg total) by mouth 4 (four) times daily as needed for flatulence.   tadalafil (PAH) 20 MG tablet Commonly known as: ADCIRCA TAKE 2 TABLETS BY MOUTH ONCE DAILY   Vitamin B12 1000 MCG Tbcr Take 1,000 mcg by mouth daily with lunch.   Vitamin D3 50 MCG (2000 UT) Caps Generic drug: Cholecalciferol Take 2,000 Units by mouth daily with lunch.   warfarin 5 MG tablet Commonly known as: COUMADIN Take as directed. If you are unsure how to take this medication, talk to your nurse or doctor. Original instructions: Take 1 tablet (5 mg total) by mouth See admin instructions. Take 1 tablet 5mg  on Tuesday/Thursday/Sunday and 1/2 Tab (2.5 mg) on MWFSat What changed: additional instructions       Major procedures and Radiology Reports - PLEASE review detailed and final reports for all details, in brief -  CT ABDOMEN PELVIS WO CONTRAST  Result Date: 12/24/2022 CLINICAL DATA:  Sepsis. EXAM: CT ABDOMEN AND PELVIS WITHOUT CONTRAST TECHNIQUE: Multidetector CT imaging of the abdomen and pelvis was performed following the standard protocol without IV contrast. RADIATION DOSE REDUCTION: This exam was performed according to the departmental dose-optimization program which includes automated exposure control, adjustment of the mA and/or kV according to patient size and/or use of iterative reconstruction technique. COMPARISON:  08/26/2022 FINDINGS: Lower chest: There is a small left pleural effusion. Interlobular septal thickening identified within the lung bases. Calcified granuloma identified in the anterior left base. Hepatobiliary: No focal liver abnormality.  Cholecystectomy. No bile duct dilatation. Pancreas: Unremarkable. No pancreatic ductal dilatation or surrounding inflammatory changes. Spleen: Normal in size without focal abnormality. Adrenals/Urinary Tract: Normal adrenal glands. Extensive bilateral renal vascular calcifications. No hydronephrosis or mass identified. Urinary bladder is unremarkable. Stomach/Bowel: Stomach appears unremarkable. There may be edema involving the second and third portions of the duodenum however this is limited due to lack of IV contrast material and paucity of enteric contrast material. No pathologic dilatation of the large or small bowel loops to suggest obstruction. The colon is decompressed. There is mild diffuse colonic wall thickening identified. Sigmoid diverticulosis identified. No signs of pneumatosis. Vascular/Lymphatic: Extensive aortic atherosclerosis with branch vessel involvement. No abdominopelvic adenopathy. Reproductive: Status post hysterectomy. No adnexal masses. Other: Mild soft tissue stranding is identified within the pelvic fat. There is also a trace amount of fluid noted within the dependent portion of the pelvis. No focal fluid collections identified. No signs of pneumoperitoneum. Musculoskeletal: Osteopenia. Severe right hip osteoarthritis. Scoliosis and multilevel degenerative disc disease. Unchanged mild superior endplate fracture deformity involving the L4 vertebra. IMPRESSION: 1. There is mild diffuse colonic wall thickening identified. Correlate for any clinical signs or symptoms of colitis. 2. There may be edema involving the second and third portions of the duodenum however this is limited due to lack of IV contrast material and paucity of enteric contrast material. Correlate for any clinical signs or symptoms of duodenitis. 3. Small left pleural effusion with interstitial edema noted within the lung bases. Findings are suggestive of mild CHF. 4. Sigmoid diverticulosis  without evidence for acute  diverticulitis. 5. Osteopenia with multilevel degenerative disc disease and right hip osteoarthritis. 6. Unchanged mild superior endplate fracture deformity involving the L4 vertebra. 7.  Aortic Atherosclerosis (ICD10-I70.0). Electronically Signed   By: Kerby Moors M.D.   On: 12/24/2022 08:52   DG Chest Port 1 View  Result Date: 12/24/2022 CLINICAL DATA:  Concern for sepsis. Nine moaning and abdominal pain/discomfort. EXAM: PORTABLE CHEST 1 VIEW COMPARISON:  08/31/2022 FINDINGS: Cardiac enlargement. Aortic atherosclerotic calcifications. Blunting of the left costophrenic angle. Mild increase interstitial markings compatible with interstitial edema. Bilateral calcified granulomas identified. Scar versus atelectasis noted in the left base. Bones appear diffusely osteopenic. Thoracolumbar scoliosis with degenerative disc disease. IMPRESSION: 1. Suspect mild CHF. 2. Cardiac enlargement and Aortic Atherosclerosis (ICD10-I70.0). Electronically Signed   By: Kerby Moors M.D.   On: 12/24/2022 05:53   VAS US CAROTID  Result Date: 12/19/2022 Carotid Arterial Duplex Study Patient Name:  SIRAH SLAYMAKER  Date of Exam:   12/19/2022 Medical Rec #: [id number]       Accession #:    [id number] Date of Birth: Jan 08, 1941       Patient Gender: F Patient Age:   71 years Exam Location:  [facility] Procedure:      VAS US CAROTID Referring Phys: Monica Martinez --------------------------------------------------------------------------------  Indications:       Bilateral carotid artery stenosis. Risk Factors:      Hypertension, hyperlipidemia, Diabetes, coronary artery                    disease, PAD. Comparison Study:  prior 03/07/22 RT ICA 40-59% stenosis LT ICA 60-79% stenosis Performing Technologist: Archie Patten RVS  Examination Guidelines: A complete evaluation includes B-mode imaging, spectral Doppler, color Doppler, and power Doppler as needed of all accessible portions of each vessel. Bilateral  testing is considered an integral part of a complete examination. Limited examinations for reoccurring indications may be performed as noted.  Right Carotid Findings: +----------+--------+--------+--------+------------------+---------------------+           PSV cm/sEDV cm/sStenosisPlaque DescriptionComments              +----------+--------+--------+--------+------------------+---------------------+ CCA Prox  100     13              heterogenous                            +----------+--------+--------+--------+------------------+---------------------+ CCA Distal88      16              heterogenous                            +----------+--------+--------+--------+------------------+---------------------+ ICA Prox  281     46      40-59%  calcific          Velocities may                                                            underestimate degree  of stenosis due to                                                        more proximal                                                             obstruction.          +----------+--------+--------+--------+------------------+---------------------+ ICA Mid   100     21                                                      +----------+--------+--------+--------+------------------+---------------------+ ICA Distal117     17                                                      +----------+--------+--------+--------+------------------+---------------------+ ECA       724             >50%                                            +----------+--------+--------+--------+------------------+---------------------+ +----------+--------+-------+--------+-------------------+           PSV cm/sEDV cmsDescribeArm Pressure (mmHG) +----------+--------+-------+--------+-------------------+ [id number]                                         +----------+--------+-------+--------+-------------------+ +---------+--------+--+--------+--+---------+ VertebralPSV cm/s53EDV cm/s11Antegrade +---------+--------+--+--------+--+---------+  Left Carotid Findings: +----------+--------+--------+--------+------------------+--------+           PSV cm/sEDV cm/sStenosisPlaque DescriptionComments +----------+--------+--------+--------+------------------+--------+ CCA Prox  91      16              heterogenous               +----------+--------+--------+--------+------------------+--------+ CCA Distal160     26              heterogenous               +----------+--------+--------+--------+------------------+--------+ ICA Prox  503     78      60-79%  calcific                   +----------+--------+--------+--------+------------------+--------+ ICA Mid   132     27                                         +----------+--------+--------+--------+------------------+--------+ ICA Distal117     26                                         +----------+--------+--------+--------+------------------+--------+  ECA       144                                                +----------+--------+--------+--------+------------------+--------+ +----------+--------+--------+--------+-------------------+           PSV cm/sEDV cm/sDescribeArm Pressure (mmHG) +----------+--------+--------+--------+-------------------+ [id number]                                         +----------+--------+--------+--------+-------------------+ +---------+--------+--+--------+---------+ VertebralPSV cm/s33EDV cm/sAntegrade +---------+--------+--+--------+---------+   Summary: Right Carotid: Velocities in the right ICA are consistent with a 40-59%                stenosis. The ECA appears >50% stenosed. Left Carotid: Velocities in the left ICA are consistent with a 60-79% stenosis. Vertebrals: Bilateral vertebral arteries demonstrate  antegrade flow. *See table(s) above for measurements and observations.  Electronically signed by Monica Martinez MD on 12/19/2022 at 12:14:03 PM.    Final     Micro Results   Recent Results (from the past 240 hour(s))  Resp panel by RT-PCR (RSV, Flu A&B, Covid) Anterior Nasal Swab     Status: None   Collection Time: 12/24/22  5:11 AM   Specimen: Anterior Nasal Swab  Result Value Ref Range Status   SARS Coronavirus 2 by RT PCR NEGATIVE NEGATIVE Final    Comment: (NOTE) SARS-CoV-2 target nucleic acids are NOT DETECTED.  The SARS-CoV-2 RNA is generally detectable in upper respiratory specimens during the acute phase of infection. The lowest concentration of SARS-CoV-2 viral copies this assay can detect is 138 copies/mL. A negative result does not preclude SARS-Cov-2 infection and should not be used as the sole basis for treatment or other patient management decisions. A negative result may occur with  improper specimen collection/handling, submission of specimen other than nasopharyngeal swab, presence of viral mutation(s) within the areas targeted by this assay, and inadequate number of viral copies(<138 copies/mL). A negative result must be combined with clinical observations, patient history, and epidemiological information. The expected result is Negative.  Fact Sheet for Patients:  EntrepreneurPulse.com.au  Fact Sheet for Healthcare Providers:  IncredibleEmployment.be  This test is no t yet approved or cleared by the [place] FDA and  has been authorized for detection and/or diagnosis of SARS-CoV-2 by FDA under an Emergency Use Authorization (EUA). This EUA will remain  in effect (meaning this test can be used) for the duration of the COVID-19 declaration under Section 564(b)(1) of the Act, 21 U.S.C.section 360bbb-3(b)(1), unless the authorization is terminated  or revoked sooner.       Influenza A by PCR NEGATIVE NEGATIVE Final    Influenza B by PCR NEGATIVE NEGATIVE Final    Comment: (NOTE) The Xpert Xpress SARS-CoV-2/FLU/RSV plus assay is intended as an aid in the diagnosis of influenza from Nasopharyngeal swab specimens and should not be used as a sole basis for treatment. Nasal washings and aspirates are unacceptable for Xpert Xpress SARS-CoV-2/FLU/RSV testing.  Fact Sheet for Patients: EntrepreneurPulse.com.au  Fact Sheet for Healthcare Providers: IncredibleEmployment.be  This test is not yet approved or cleared by the [place] FDA and has been authorized for detection and/or diagnosis of SARS-CoV-2 by FDA under an Emergency Use Authorization (EUA). This EUA will remain in effect (meaning this test can be used) for  the duration of the COVID-19 declaration under Section 564(b)(1) of the Act, 21 U.S.C. section 360bbb-3(b)(1), unless the authorization is terminated or revoked.     Resp Syncytial Virus by PCR NEGATIVE NEGATIVE Final    Comment: (NOTE) Fact Sheet for Patients: EntrepreneurPulse.com.au  Fact Sheet for Healthcare Providers: IncredibleEmployment.be  This test is not yet approved or cleared by the [place] FDA and has been authorized for detection and/or diagnosis of SARS-CoV-2 by FDA under an Emergency Use Authorization (EUA). This EUA will remain in effect (meaning this test can be used) for the duration of the COVID-19 declaration under Section 564(b)(1) of the Act, 21 U.S.C. section 360bbb-3(b)(1), unless the authorization is terminated or revoked.  Performed at [facility], [address], [place], [place] [address]   Blood culture (routine x 2)     Status: None   Collection Time: 12/24/22  5:15 AM   Specimen: BLOOD LEFT HAND  Result Value Ref Range Status   Specimen Description   Final    BLOOD LEFT HAND BOTTLES DRAWN AEROBIC AND ANAEROBIC   Special Requests Blood Culture adequate volume  Final    Culture   Final    NO GROWTH 5 DAYS Performed at [facility], [address] [place], [place] [address]    Report Status 12/29/2022 FINAL  Final  Blood culture (routine x 2)     Status: None   Collection Time: 12/24/22  5:20 AM   Specimen: Left Antecubital; Blood  Result Value Ref Range Status   Specimen Description   Final    LEFT ANTECUBITAL BOTTLES DRAWN AEROBIC AND ANAEROBIC   Special Requests   Final    Blood Culture results may not be optimal due to an excessive volume of blood received in culture bottles   Culture   Final    NO GROWTH 5 DAYS Performed at [facility], [address], [place], [place] [address]    Report Status 12/29/2022 FINAL  Final  MRSA Next Gen by PCR, Nasal     Status: None   Collection Time: 12/24/22  6:06 PM   Specimen: Nasal Mucosa; Nasal Swab  Result Value Ref Range Status   MRSA by PCR Next Gen NOT DETECTED NOT DETECTED Final    Comment: (NOTE) The GeneXpert MRSA Assay (FDA approved for NASAL specimens only), is one component of a comprehensive MRSA colonization surveillance program. It is not intended to diagnose MRSA infection nor to guide or monitor treatment for MRSA infections. Test performance is not FDA approved in patients less than 25 years old. Performed at [facility], [address] [place], [place] [address]    Today   Subjective    Dariya Cantrell today has no new complaints Patient ambulated around 40 ft in the hallway with a walker. Patient's o2 saturation was in between 98-100%. Heart rate was in between 98-122. Patient didn't complain of sob, but did state that she was tired and felt weak.  Post ambulation HR down to 78 to 84 -No chest pains no palpitations no dizziness   Patient has been seen and examined prior to discharge   Objective   Blood pressure (!) 161/72, pulse 82, temperature 97.8 F (36.6 C), temperature source Oral, resp. rate 17, height 4\' 10"  (1.473 m), weight 50 kg, SpO2 100 %.   Intake/Output Summary  (Last 24 hours) at 12/31/2022 1212 Last data filed at 12/31/2022 0500 Gross per 24 hour  Intake 240 ml  Output 550 ml  Net -310 ml  Exam Gen:- Awake Alert, no acute distress , speaking in complete sentences HEENT:- [place].AT, No sclera icterus Neck-Supple Neck,No JVD,.  Lungs-  CTAB , good air movement bilaterally CV- S1, S2 normal, irregularly irregular, , metallic click  abd-  +ve B.Sounds, Abd Soft, No tenderness,    Extremity/Skin:- No  edema,   good pulses Psych-affect is appropriate, oriented x3 Neuro-generalized weakness, no new focal deficits, no tremors 3   Data Review   CBC w Diff:  Lab Results  Component Value Date   WBC 6.7 12/31/2022   HGB 8.9 (L) 12/31/2022   HGB 11.9 (L) 11/10/2022   HCT 29.3 (L) 12/31/2022   HCT 28.5 (L) 11/20/2018   PLT 168 12/31/2022   PLT 153 11/10/2022   LYMPHOPCT 22 12/22/2022   BANDSPCT 2 09/05/2022   MONOPCT 9 12/22/2022   EOSPCT 5 12/22/2022   BASOPCT 1 12/22/2022    CMP:  Lab Results  Component Value Date   NA 139 12/31/2022   NA 137 12/06/2017   K 3.8 12/31/2022   CL 112 (H) 12/31/2022   CO2 18 (L) 12/31/2022   BUN 31 (H) 12/31/2022   BUN 20 12/06/2017   CREATININE 1.49 (H) 12/31/2022   CREATININE 2.32 (H) 12/08/2022   PROT 5.7 (L) 12/27/2022   ALBUMIN 2.9 (L) 12/30/2022   BILITOT 0.4 12/27/2022   BILITOT 0.7 12/08/2022   ALKPHOS 105 12/27/2022   AST 36 12/27/2022   AST 60 (H) 12/08/2022   ALT 21 12/27/2022   ALT 42 12/08/2022  .  Total Discharge time is about 33 minutes  Roxan Hockey M.D on 12/31/2022 at 12:12 PM  Go to [email or web address] -  for contact info  [facility] - Office  [phone]

## 2022-12-31 NOTE — Progress Notes (Signed)
Patient's hr is in between 111 and 138 with a bp of 162/78. Patient was given scheduled medicines. Patient is not complaining of any chest pain or sob. Patient does state that she is anxious. MD Emokpae notified.

## 2022-12-31 NOTE — Progress Notes (Signed)
Pt slept through the night. Pt hypertensive, other vitals stable. No c/o pain or nausea.

## 2022-12-31 NOTE — Progress Notes (Signed)
ANTICOAGULATION CONSULT NOTE  Pharmacy Consult for warfarin  Indication: atrial fibrillation and aortic valve replacement  Allergies: augmentin, FQ, codeine, coreg, diflucan, losartan, IVIG, bactrim, verapamil, zetia, zocor,   Patient Measurements: Height: 4\' 10"  (147.3 cm) Weight: 50 kg (110 lb 3.7 oz) IBW/kg (Calculated) : 40.9  Vital Signs: Temp: 98.4 F (36.9 C) (03/24 0358) Temp Source: Oral (03/24 0358) BP: 162/78 (03/24 0835) Pulse Rate: 111 (03/24 0835)  Labs: Recent Labs    12/29/22 0422 12/30/22 0517 12/31/22 0533  HGB  --  8.1* 8.9*  HCT  --  27.3* 29.3*  PLT  --  153 168  LABPROT 49.9* 36.7* 27.9*  INR 5.5* 3.7* 2.6*  CREATININE  --  1.59* 1.49*   Estimated Creatinine Clearance: 20.1 mL/min (A) (by C-G formula based on SCr of 1.49 mg/dL (H)).  Assessment: 82 year old female with history afib, CAD s/p CABG and mechanical AVR on coumadin prior to admit. INR was at upper end of goal on admit at 3.5, she was last seen in [place] clinic on 3/15 with an INR of 4.4.   Prior to admit warfarin dose was 5mg  daily.  3/24: INR 2.7, continues to trend down and is now within goal range . Note higher goal range for mechanical valve. Patients appetite has improved and no further bleeding noted at IV site. Black stools reported yesterday, Hgb low but stable. Dosing cautiously given ongoing concern for GI bleed and DI with metronidazole. Discharge planned today.  Goal of Therapy:  INR goal 2.5-3.5 Monitor platelets by anticoagulation protocol: Yes   Plan:  DC home on 5mg  alt 2.5mg  while on metronidazole Recheck PT/INR within a week  Lorenso Courier, PharmD Clinical Pharmacist 12/31/2022 9:37 AM

## 2023-01-01 ENCOUNTER — Other Ambulatory Visit: Payer: Self-pay | Admitting: *Deleted

## 2023-01-01 ENCOUNTER — Telehealth: Payer: Self-pay | Admitting: *Deleted

## 2023-01-01 DIAGNOSIS — I5032 Chronic diastolic (congestive) heart failure: Secondary | ICD-10-CM

## 2023-01-01 NOTE — Progress Notes (Signed)
  Care Coordination   Note   01/01/2023 Name: [name] MRN: [id number] DOB: [birth year]  [name] is a 82 y.o. year old female who sees Branch, Alphonse Guild, MD for primary care. I reached out to [name] by phone today to offer care coordination services.  [name] was given information about Care Coordination services today including:   The Care Coordination services include support from the care team which includes your Nurse Coordinator, Clinical Social Worker, or Pharmacist.  The Care Coordination team is here to help remove barriers to the health concerns and goals most important to you. Care Coordination services are voluntary, and the patient may decline or stop services at any time by request to their care team member.   Care Coordination Consent Status: Patient son Madaline Mulato agreed to services and verbal consent obtained.   Follow up plan:  Telephone appointment with care coordination team member scheduled for:  01/04/23  Encounter Outcome:  Pt. Scheduled [place]  Direct Dial: [phone]

## 2023-01-03 ENCOUNTER — Other Ambulatory Visit: Payer: Self-pay | Admitting: *Deleted

## 2023-01-03 DIAGNOSIS — I5032 Chronic diastolic (congestive) heart failure: Secondary | ICD-10-CM | POA: Diagnosis not present

## 2023-01-03 DIAGNOSIS — I7782 Antineutrophilic cytoplasmic antibody (ANCA) vasculitis: Secondary | ICD-10-CM | POA: Diagnosis not present

## 2023-01-03 DIAGNOSIS — Z7901 Long term (current) use of anticoagulants: Secondary | ICD-10-CM

## 2023-01-03 DIAGNOSIS — D649 Anemia, unspecified: Secondary | ICD-10-CM | POA: Diagnosis not present

## 2023-01-03 DIAGNOSIS — N184 Chronic kidney disease, stage 4 (severe): Secondary | ICD-10-CM | POA: Diagnosis not present

## 2023-01-03 DIAGNOSIS — I739 Peripheral vascular disease, unspecified: Secondary | ICD-10-CM | POA: Diagnosis not present

## 2023-01-03 DIAGNOSIS — I482 Chronic atrial fibrillation, unspecified: Secondary | ICD-10-CM | POA: Diagnosis not present

## 2023-01-03 DIAGNOSIS — E1151 Type 2 diabetes mellitus with diabetic peripheral angiopathy without gangrene: Secondary | ICD-10-CM | POA: Diagnosis not present

## 2023-01-03 DIAGNOSIS — Z952 Presence of prosthetic heart valve: Secondary | ICD-10-CM

## 2023-01-03 DIAGNOSIS — E11319 Type 2 diabetes mellitus with unspecified diabetic retinopathy without macular edema: Secondary | ICD-10-CM | POA: Diagnosis not present

## 2023-01-03 DIAGNOSIS — I130 Hypertensive heart and chronic kidney disease with heart failure and stage 1 through stage 4 chronic kidney disease, or unspecified chronic kidney disease: Secondary | ICD-10-CM | POA: Diagnosis not present

## 2023-01-04 ENCOUNTER — Inpatient Hospital Stay: Payer: Medicare HMO

## 2023-01-04 ENCOUNTER — Other Ambulatory Visit: Payer: Self-pay

## 2023-01-04 ENCOUNTER — Inpatient Hospital Stay: Payer: Medicare HMO | Admitting: Hematology and Oncology

## 2023-01-04 ENCOUNTER — Ambulatory Visit: Payer: Self-pay

## 2023-01-04 VITALS — BP 135/52 | HR 80 | Temp 97.3°F | Resp 18 | Ht <= 58 in | Wt 118.7 lb

## 2023-01-04 DIAGNOSIS — R600 Localized edema: Secondary | ICD-10-CM | POA: Diagnosis not present

## 2023-01-04 DIAGNOSIS — D591 Autoimmune hemolytic anemia, unspecified: Secondary | ICD-10-CM | POA: Diagnosis not present

## 2023-01-04 DIAGNOSIS — D638 Anemia in other chronic diseases classified elsewhere: Secondary | ICD-10-CM

## 2023-01-04 DIAGNOSIS — D631 Anemia in chronic kidney disease: Secondary | ICD-10-CM | POA: Diagnosis not present

## 2023-01-04 DIAGNOSIS — D500 Iron deficiency anemia secondary to blood loss (chronic): Secondary | ICD-10-CM

## 2023-01-04 DIAGNOSIS — Z7901 Long term (current) use of anticoagulants: Secondary | ICD-10-CM

## 2023-01-04 DIAGNOSIS — I7782 Antineutrophilic cytoplasmic antibody (ANCA) vasculitis: Secondary | ICD-10-CM | POA: Diagnosis not present

## 2023-01-04 DIAGNOSIS — N184 Chronic kidney disease, stage 4 (severe): Secondary | ICD-10-CM | POA: Diagnosis not present

## 2023-01-04 DIAGNOSIS — M8580 Other specified disorders of bone density and structure, unspecified site: Secondary | ICD-10-CM | POA: Diagnosis not present

## 2023-01-04 DIAGNOSIS — Z952 Presence of prosthetic heart valve: Secondary | ICD-10-CM

## 2023-01-04 LAB — COMPREHENSIVE METABOLIC PANEL
ALT: 17 U/L (ref 0–44)
AST: 43 U/L — ABNORMAL HIGH (ref 15–41)
Albumin: 3.7 g/dL (ref 3.5–5.0)
Alkaline Phosphatase: 117 U/L (ref 38–126)
Anion gap: 10 (ref 5–15)
BUN: 33 mg/dL — ABNORMAL HIGH (ref 8–23)
CO2: 21 mmol/L — ABNORMAL LOW (ref 22–32)
Calcium: 8.7 mg/dL — ABNORMAL LOW (ref 8.9–10.3)
Chloride: 108 mmol/L (ref 98–111)
Creatinine, Ser: 2.22 mg/dL — ABNORMAL HIGH (ref 0.44–1.00)
GFR, Estimated: 21 mL/min — ABNORMAL LOW (ref >60)
Glucose, Bld: 140 mg/dL — ABNORMAL HIGH (ref 70–99)
Potassium: 4.1 mmol/L (ref 3.5–5.1)
Sodium: 139 mmol/L (ref 135–145)
Total Bilirubin: 0.5 mg/dL (ref 0.3–1.2)
Total Protein: 6 g/dL — ABNORMAL LOW (ref 6.5–8.1)

## 2023-01-04 LAB — CBC WITH DIFFERENTIAL/PLATELET
Abs Immature Granulocytes: 0.01 10*3/uL (ref 0.00–0.07)
Basophils Absolute: 0.1 10*3/uL (ref 0.0–0.1)
Basophils Relative: 1 %
Eosinophils Absolute: 0.2 10*3/uL (ref 0.0–0.5)
Eosinophils Relative: 4 %
HCT: 29.8 % — ABNORMAL LOW (ref 36.0–46.0)
Hemoglobin: 9.4 g/dL — ABNORMAL LOW (ref 12.0–15.0)
Immature Granulocytes: 0 %
Lymphocytes Relative: 22 %
Lymphs Abs: 1.2 10*3/uL (ref 0.7–4.0)
MCH: 31.6 pg (ref 26.0–34.0)
MCHC: 31.5 g/dL (ref 30.0–36.0)
MCV: 100.3 fL — ABNORMAL HIGH (ref 80.0–100.0)
Monocytes Absolute: 0.5 10*3/uL (ref 0.1–1.0)
Monocytes Relative: 8 %
Neutro Abs: 3.5 10*3/uL (ref 1.7–7.7)
Neutrophils Relative %: 65 %
Platelets: 199 10*3/uL (ref 150–400)
RBC: 2.97 MIL/uL — ABNORMAL LOW (ref 3.87–5.11)
RDW: 18.2 % — ABNORMAL HIGH (ref 11.5–15.5)
WBC: 5.5 10*3/uL (ref 4.0–10.5)
nRBC: 0 % (ref 0.0–0.2)

## 2023-01-04 LAB — PROTIME-INR
INR: 2.3 — ABNORMAL HIGH (ref 0.8–1.2)
Prothrombin Time: 25 seconds — ABNORMAL HIGH (ref 11.4–15.2)

## 2023-01-04 LAB — SAMPLE TO BLOOD BANK

## 2023-01-04 MED ORDER — EPOETIN ALFA-EPBX 40000 UNIT/ML IJ SOLN
40000.0000 [IU] | Freq: Once | INTRAMUSCULAR | Status: AC
  Administered 2023-01-04: 40000 [IU] via SUBCUTANEOUS
  Filled 2023-01-04: qty 1

## 2023-01-04 NOTE — Patient Outreach (Signed)
  Care Coordination   Initial Visit Note   01/04/2023 Name: [name] MRN: [id number] DOB: Aug 07, 1941  [name] is a 82 y.o. year old female who sees Branch, Alphonse Guild, MD for primary care. I spoke with  [name] by phone today.  What matters to the patients health and wellness today?  Patient will consider wearing compression socks and will keep her legs elevated while resting to help manage her bilateral leg edema.     Goals Addressed             This Visit's Progress    COMPLETED: Care Coordination Activities: further follow up needed       Care Coordination Interventions: Evaluation of current treatment plan related to GI bleeding and patient's adherence to plan as established by provider Received the following hospital update from Natividad Brood, [place] "Good morning! Patient did transition to [facility] 07/14/22 for [place] rehab, has [facility]"        RN Care Coordination Activities: further follow up needed       Care Coordination Interventions: Evaluation of current treatment plan related to bilateral edema to lower extremities and patient's adherence to plan as established by provider Reviewed and discussed with patient her recent IP event for acute GI bleeding  Reviewed and discussed post discharge instructions with patient including MD recommendations post discharge to avoid ibuprofen/Advil/Aleve/Motrin/Goody Powders/Naproxen/BC powders/Meloxicam/Diclofenac/Indomethacin and other Nonsteroidal anti-inflammatory medications as these will make you more likely to bleed and can cause stomach ulcers, can also cause Kidney problems Discussed [facility] is scheduled for tomorrow, 01/04/23  Determined patient completed a post discharge follow up with PCP, Dr. Osborne Casco on 01/03/23 Determined patient's primary focus is related to management of lower extremity edema Reviewed and discussed PCP  recommendations for longterm compression and elevation for prevention and management of disease. wear compression stockings during the day and off at night. elevate legs, toes above nose 30 min BID and when seated. avoid allowing legs to hang in dependent position for extended periods of time. walking helps promote venous return   Reviewed and discussed recommendations for fluid management with lasix, reviewed current dosage Educated patient on importance of following a low Sodium diet to help with fluid management  Assessed for SDOH barriers, no needs identified at this time Reviewed upcoming scheduled appointments with [facility] scheduled for today and Nephrology f/u with Dr. Jonnie Finner scheduled for next week, son will provide transportation       Interventions Today    Flowsheet Row Most Recent Value  Chronic Disease   Chronic disease during today's visit Chronic Kidney Disease/End Stage Renal Disease (ESRD), Other, Diabetes  [bilateral edema]  General Interventions   General Interventions Discussed/Reviewed General Interventions Discussed, General Interventions Reviewed, Labs, Doctor Visits  Doctor Visits Discussed/Reviewed Doctor Visits Discussed, Doctor Visits Reviewed, PCP, Specialist  Education Interventions   Education Provided Provided Education  Provided Verbal Education On Nutrition, Labs, Medication, When to see the doctor  Nutrition Interventions   Nutrition Discussed/Reviewed Nutrition Discussed, Decreasing salt          SDOH assessments and interventions completed:  No     Care Coordination Interventions:  Yes, provided   Follow up plan: Follow up call scheduled for 02/05/23 @10 :30 AM    Encounter Outcome:  Pt. Visit Completed

## 2023-01-04 NOTE — Patient Instructions (Signed)

## 2023-01-04 NOTE — Patient Instructions (Signed)
Visit Information  Thank you for taking time to visit with me today. Please don't hesitate to contact me if I can be of assistance to you.   Following are the goals we discussed today:   Goals Addressed             This Visit's Progress    COMPLETED: Care Coordination Activities: further follow up needed       Care Coordination Interventions: Evaluation of current treatment plan related to GI bleeding and patient's adherence to plan as established by provider Received the following hospital update from Natividad Brood, [place] "Good morning! Patient did transition to [facility] 07/14/22 for [place] rehab, has [facility]"        RN Care Coordination Activities: further follow up needed       Care Coordination Interventions: Evaluation of current treatment plan related to bilateral edema to lower extremities and patient's adherence to plan as established by provider Reviewed and discussed with patient her recent IP event for acute GI bleeding  Reviewed and discussed post discharge instructions with patient including MD recommendations post discharge to avoid ibuprofen/Advil/Aleve/Motrin/Goody Powders/Naproxen/BC powders/Meloxicam/Diclofenac/Indomethacin and other Nonsteroidal anti-inflammatory medications as these will make you more likely to bleed and can cause stomach ulcers, can also cause Kidney problems Discussed [facility] is scheduled for tomorrow, 01/04/23  Determined patient completed a post discharge follow up with PCP, Dr. Osborne Casco on 01/03/23 Determined patient's primary focus is related to management of lower extremity edema Reviewed and discussed PCP recommendations for longterm compression and elevation for prevention and management of disease. wear compression stockings during the day and off at night. elevate legs, toes above nose 30 min BID and when seated. avoid allowing legs to hang in dependent  position for extended periods of time. walking helps promote venous return   Reviewed and discussed recommendations for fluid management with lasix, reviewed current dosage Educated patient on importance of following a low Sodium diet to help with fluid management  Assessed for SDOH barriers, no needs identified at this time Reviewed upcoming scheduled appointments with [facility] scheduled for today and Nephrology f/u with Dr. Jonnie Finner scheduled for next week, son will provide transportation           [place] next appointment is by telephone on 02/05/23 at 10:30 AM  Please call the care guide team at [phone] if you need to cancel or reschedule your appointment.   If you are experiencing a Mental Health or [place] or need someone to talk to, please call 1-800-273-TALK (toll free, 24 hour hotline) go to [facility] [address] [place] [phone])  Patient verbalizes understanding of instructions and care plan provided today and agrees to view in [place]. Active MyChart status and patient understanding of how to access instructions and care plan via MyChart confirmed with patient.     Barb Merino, RN, BSN, CCM Care Management Coordinator [facility] Care Management Direct Phone: [phone]

## 2023-01-04 NOTE — Progress Notes (Signed)
[name] Follow up:    [name], MD [place] [id number]  DIAGNOSIS: Anemia, multifactorial  SUMMARY OF HEMATOLOGIC HISTORY:  [name] is a 82 y.o. [place] woman with multifactorial anemia, as follows:             (a) anemia of chronic inflammation: As of May 2020 she has a SED rate of 95, CRP 1.1, positive ANA and RF; subsequently she was found to be ANCA positive             (b) hemolysis (mild): she has a mildly elevated LDH and a DAT positive for complement, not IgG; this is c/w (a) above             (c) anemia of renal insufficiency: inadequate EPO resonse to anemia                         (d) GIB/ iron deficiency in patient on lifelong anticoagulaton   (1) Feraheme received 03/01/2019, repeated 03/20/2019              (a) reticulocyte 03/07/2019 up from 43.4 a year ago to 191.8 after iron infusion             (b) feraheme repeated on 05/12/2019             (c) subsequent ferritin levels >100   (2) darbepoetin: starting 05/05/2019 at 152mcg given every 2 weeks             (a) dose increased to 200 mcg every week starting 07/01/2019              (b) back to every 2 weeks beginning 08/04/2019 still at 200 mcg dose             (c) switched to Retacrit every 4 weeks as of 08/18/2019   (3) ANCA positive vasculitis: diagnosed 05/08/2019 (titer 1:640)             (a) prednisone 40 mg daily started 05/31/2019             (b) rituximab weekly started 06/12/2019 (received test dose 06/05/2019)                         (i) hepatitis B sAg, core Ab and HIV negative 05/08/2019                         (ii) rituximab held after 07/14/2019 dose (received 5 weekly doses) with neutropenia                         (iii) rituximab resumed 08/18/2019 but changed to monthly                         (iv) rituximab changed to every 2 months beginning with 02/02/2020 dose                         (v) rituximab changed to every 12 weeks after  the 10/12/2020 dose             (c) prednisone tapered to off as of 09/15/2019                          (4) Anemia worsened  in spring 2023, refractory to Rituximab and requiring prednisone restart with slow taper due currently at 20mg  which she will stay at.   (5) osteopenia with a T score of -1.5 on bone density 02/28/2016 at the [facility] center  CURRENT THERAPY: Daily Epo injection q 14 days  INTERVAL HISTORY:  [name] 82 y.o. female returns for follow-up of anemia.   She is here with her son. Since her last visit, she was admitted to the hospital with diarrhea, had some issues with elevated INR which has now resolved.  Her diarrhea and abdominal pain has gotten better.  She continues to have lower extremity swelling, has Ace wrap's.  No fevers or chills.  No bleeding issues reported. Rest of the pertinent 10 point ROS reviewed and negative  Patient Active Problem List   Diagnosis Date Noted   Protein-calorie malnutrition, severe 12/26/2022   Acute GI bleeding 12/24/2022   Coagulopathy ([place]) 12/24/2022   Melena 12/24/2022   Duodenitis 12/24/2022   Noninfectious gastroenteritis 12/24/2022   CAD, multiple vessel 12/24/2022   Acute on chronic heart failure with preserved ejection fraction (HFpEF) ([place]) 08/29/2022   Acute exacerbation of CHF (congestive heart failure) ([place]) 08/29/2022   Acute urinary retention 08/28/2022   Acute kidney injury superimposed on chronic kidney disease ([place]) 08/27/2022   Constipation 08/27/2022   Nausea and vomiting 08/27/2022   Hypoalbuminemia due to protein-calorie malnutrition ([place]) 08/27/2022   Type 2 diabetes mellitus with hyperglycemia ([place]) 08/27/2022   Palliative care encounter    Goals of care, counseling/discussion    Encounter for therapeutic drug monitoring    GI bleed 07/09/2022   GI bleeding 07/08/2022   Acute on chronic anemia 07/08/2022   History of Clostridioides difficile colitis 07/08/2022   DNR (do not resuscitate)/DNI(Do  Not Intubate) 07/08/2022   Anemia of chronic disease 07/07/2022   Pressure injury of skin 06/23/2022   UTI (urinary tract infection) 06/22/2022   Joint pain 06/09/2022   Parietoalveolar pneumopathy ([place]) 06/09/2022   Primary osteoarthritis 06/09/2022   Rheumatoid factor positive 06/09/2022   C. difficile colitis 04/19/2022   Colitis 04/18/2022   PAH (pulmonary artery hypertension) ([place]) 04/18/2022   Emphysematous cystitis 04/13/2022   Female bladder prolapse 04/13/2022   Diverticulitis 04/12/2022   Autoimmune hemolytic anemia ([place]) 01/02/2022   Bilateral lower extremity edema 01/02/2022   Refractory anemia (HCC) 12/15/2021   Abnormal chest x-ray 12/15/2021   Supratherapeutic INR 12/15/2021   Elevated troponin 12/15/2021   Unilateral primary osteoarthritis, right hip 05/31/2021   Midline cystocele 05/30/2021   Primary localized osteoarthritis of pelvic region and thigh 05/30/2021   Vaginal pain 05/30/2021   Asymmetric SNHL (sensorineural hearing loss) 01/06/2021   Referred otalgia of both ears 01/06/2021   Bilateral hearing loss 08/31/2020   Low back pain 02/20/2020   CKD (chronic kidney disease), stage IV ([place]) 10/13/2019   Leukopenia due to antineoplastic chemotherapy ([place]) 08/18/2019   Thrombophilia ([place]) 06/19/2019   Encounter for general adult medical examination without abnormal findings 06/13/2019   Arteritis ([place]) 06/06/2019   Hypertensive heart and chronic kidney disease with heart failure and stage 1 through stage 4 chronic kidney disease, or unspecified chronic kidney disease ([place]) 06/06/2019   Muscle weakness 06/06/2019   Pancytopenia ([place]) 05/28/2019   Dyspnea 05/28/2019   Vasculitis, ANCA positive 05/28/2019   Disorder of connective tissue ([place]) 05/14/2019   Renal insufficiency 05/08/2019   PAF (paroxysmal atrial fibrillation) ([place]) 04/07/2019   Iron deficiency anemia 02/28/2019   Hemolytic anemia associated with chronic inflammatory  disease ([place]) 02/28/2019    Diabetic retinopathy associated with type 2 diabetes mellitus ([place]) 09/02/2018   Chronic pain 06/19/2018   Non-thrombocytopenic purpura ([place]) 05/21/2018   Gout 03/22/2018   Malnutrition of moderate degree 03/12/2018   Diabetes mellitus type 2, controlled ([place]) 03/06/2018   Chronic atrial fibrillation 03/06/2018   H/O mechanical aortic valve replacement 03/06/2018   Moderate to severe pulmonary hypertension ([place]) 03/06/2018   GERD without esophagitis 02/21/2018   Anxiety 02/21/2018   Chronic diastolic CHF (congestive heart failure) ([place]) 02/21/2018   General weakness 02/02/2018   Thrombocytopenia ([place]) 01/25/2018   Arm pain, diffuse 01/25/2018   Anemia 01/23/2018   Aortic atherosclerosis ([place]) 01/23/2018   Age-related osteoporosis without current pathological fracture 05/24/2017   Carotid artery occlusion 05/24/2017   Generalized anxiety disorder 05/24/2017   Hearing loss 05/24/2017   Presence of prosthetic heart valve 05/24/2017   Carotid artery disease ([place]) 08/18/2013   Peripheral arterial disease ([place]) 08/18/2013   Diabetes mellitus without complication ([place]) 0000000   Essential hypertension    Hypercholesteremia    Mechanical heart valve present    Chronic anticoagulation     is allergic to augmentin [amoxicillin-pot clavulanate], ciprofloxacin, codeine, coreg [carvedilol], diflucan [fluconazole], levofloxacin in d5w, losartan, privigen [immune globulin (human)], sulfamethoxazole-trimethoprim, verapamil, zetia [ezetimibe], zocor [simvastatin - high dose], and gatifloxacin.  MEDICAL HISTORY: Past Medical History:  Diagnosis Date   Aortic atherosclerosis ([place]) 01/23/2018   Atrial fibrillation (HCC)    Benign positional vertigo 10/06/2011   Carotid artery disease (HCC)    Chemotherapy induced neutropenia ([place]) 07/21/2019   Cholelithiasis 01/23/2018   Cholelithiasis 01/23/2018   Chronic anticoagulation    Colovesical fistula, ruled out 04/14/2022   Coronary artery  disease    status post coronary artery bypass grafting times 07/10/2004   Diabetes mellitus    Hypercholesteremia    Hypertension    Mechanical heart valve present    H. aortic valve replacement at the time of bypass surgery October 2005   Moderate to severe pulmonary hypertension ([place])    Peripheral arterial disease ([place])    history of left common iliac artery PTA and stenting for a chronic total occlusion 08/26/01   S/P cholecystectomy 03/06/2018    SURGICAL HISTORY: Past Surgical History:  Procedure Laterality Date   anterior repair  2009   AORTIC VALVE REPLACEMENT  2005   Dr. Cyndia Bent   CARDIAC CATHETERIZATION  11/10/2004   40% right common illiac, 70% in stent restenosis of distal left common illiac,    CARDIAC CATHETERIZATION  05/18/2004   LAD 50-70% midstenosis, RCA dominant w/50% stenosis, 50% Right common Illiac artery ostial stenosis, 90% in stent restenosis within midportion of left common illiac stent   Carotid Duplex  03/12/2012   RSA-elev. velocities suggestive of a 50-69% diameter reduction, Right&Left Bulb/Prox ICA-mild-mod.fibrous plaqueelevating Velocities abnormal study.   CHOLECYSTECTOMY N/A 03/01/2018   Procedure: LAPAROSCOPIC CHOLECYSTECTOMY;  Surgeon: Judeth Horn, MD;  Location: [facility];  Service: General;  Laterality: N/A;   COLONOSCOPY WITH PROPOFOL N/A 01/22/2018   Procedure: COLONOSCOPY WITH PROPOFOL;  Surgeon: Wilford Corner, MD;  Location: [facility];  Service: Endoscopy;  Laterality: N/A;   ESOPHAGOGASTRODUODENOSCOPY N/A 07/13/2022   Procedure: ESOPHAGOGASTRODUODENOSCOPY (EGD);  Surgeon: [name], MD;  Location: [facility];  Service: Gastroenterology;  Laterality: N/A;   ESOPHAGOGASTRODUODENOSCOPY (EGD) WITH PROPOFOL N/A 01/22/2018   Procedure: ESOPHAGOGASTRODUODENOSCOPY (EGD) WITH PROPOFOL;  Surgeon: Wilford Corner, MD;  Location: [facility];  Service: Endoscopy;  Laterality: N/A;   ESOPHAGOGASTRODUODENOSCOPY (EGD) WITH PROPOFOL N/A 11/21/2018  Procedure: ESOPHAGOGASTRODUODENOSCOPY (EGD) WITH PROPOFOL;  Surgeon: [name], MD;  Location: [facility];  Service: Gastroenterology;  Laterality: N/A;   ESOPHAGOGASTRODUODENOSCOPY (EGD) WITH PROPOFOL Left 02/27/2019   Procedure: ESOPHAGOGASTRODUODENOSCOPY (EGD) WITH PROPOFOL;  Surgeon: Arta Silence, MD;  [facility];  Service: Endoscopy;  Laterality: Left;   GIVENS CAPSULE STUDY N/A 11/21/2018   Procedure: GIVENS CAPSULE STUDY;  Surgeon: [name], MD;  Location: [facility];  Service: Gastroenterology;  Laterality: N/A;  To be deployed during EGD   Lower Ext. Duplex  03/12/2012   Right Proximal CIA- vessel narrowing w/elevated velocities 0-49% diameter reduction. Right SFA-mild mixed density plaque throughout vessel.   NM MYOCAR PERF WALL MOTION  05/19/2010   protocol: Persantine, post stress EF 65%, negative for ischemia, low risk scan   RIGHT HEART CATH N/A 06/27/2018   Procedure: RIGHT HEART CATH;  Surgeon: [name], MD;  Location: [facility];  Service: Cardiovascular;  Laterality: N/A;   TOTAL ABDOMINAL HYSTERECTOMY W/ BILATERAL SALPINGOOPHORECTOMY  1989   TRANSTHORACIC ECHOCARDIOGRAM  08/29/2012   Moderately calcified annulus of mitral valve, moderate regurg. of both mitral valve and tricuspid valve.     SOCIAL HISTORY: Social History   Socioeconomic History   Marital status: Widowed    Spouse name: Not on file   Number of children: Not on file   Years of education: Not on file   Highest education level: Not on file  Occupational History   Occupation: Retired  Tobacco Use   Smoking status: Former    Packs/day: 1.00    Years: 30.00    Additional pack years: 0.00    Total pack years: 30.00    Types: Cigarettes   Smokeless tobacco: Never   Tobacco comments:    quit smoking 2005  Vaping Use   Vaping Use: Never used  Substance and Sexual Activity   Alcohol use: No    Alcohol/week: 0.0 standard drinks of alcohol   Drug use: No   Sexual  activity: Not Currently    Birth control/protection: None  Other Topics Concern   Not on file  Social History Narrative   Not on file   Social Determinants of Health   Financial Resource Strain: Low Risk  (02/28/2022)   Overall Financial Resource Strain (CARDIA)    Difficulty of Paying Living Expenses: Not hard at all  Food Insecurity: No Food Insecurity (12/25/2022)   Hunger Vital Sign    Worried About Running Out of Food in the Last Year: Never true    [place] in the Last Year: Never true  Transportation Needs: No Transportation Needs (12/25/2022)   PRAPARE - Hydrologist (Medical): No    Lack of Transportation (Non-Medical): No  Physical Activity: Inactive (02/28/2022)   Exercise Vital Sign    Days of Exercise per Week: 0 days    Minutes of Exercise per Session: 0 min  Stress: No Stress Concern Present (02/28/2022)   [place]    Feeling of Stress : Only a little  Social Connections: Moderately Integrated (06/22/2022)   Social Connection and Isolation Panel [NHANES]    Frequency of Communication with Friends and Family: More than three times a week    Frequency of Social Gatherings with Friends and Family: More than three times a week    Attends Religious Services: More than 4 times per year    Active Member of [employer] or Organizations: Yes  Attends Archivist Meetings: More than 4 times per year    Marital Status: Widowed  Intimate Partner Violence: Not At Risk (12/25/2022)   Humiliation, Afraid, Rape, and Kick questionnaire    Fear of Current or Ex-Partner: No    Emotionally Abused: No    Physically Abused: No    Sexually Abused: No    FAMILY HISTORY: Family History  Problem Relation Age of Onset   Heart disease Father    Hypertension Father    Hyperlipidemia Father    Breast cancer Neg Hx     Review of Systems  Constitutional:  Positive for fatigue.  Negative for appetite change, chills, fever and unexpected weight change.  HENT:   Negative for hearing loss, lump/mass, mouth sores, trouble swallowing and voice change.   Eyes:  Negative for eye problems and icterus.  Respiratory:  Negative for chest tightness, cough and shortness of breath.   Cardiovascular:  Positive for leg swelling (Ace wrap's). Negative for chest pain and palpitations.  Gastrointestinal:  Negative for abdominal distention, abdominal pain, blood in stool, constipation, diarrhea, nausea, rectal pain and vomiting.  Endocrine: Negative for hot flashes.  Genitourinary:  Negative for difficulty urinating.   Musculoskeletal:  Negative for arthralgias.  Skin:  Negative for itching and rash.  Neurological:  Negative for dizziness, extremity weakness, headaches and numbness.  Hematological:  Negative for adenopathy. Does not bruise/bleed easily.  Psychiatric/Behavioral:  Negative for depression. The patient is not nervous/anxious.       PHYSICAL EXAMINATION  ECOG PERFORMANCE STATUS: 1 - Symptomatic but completely ambulatory  Vitals:   01/04/23 1325  BP: (!) 135/52  Pulse: 80  Resp: 18  Temp: (!) 97.3 F (36.3 C)  SpO2: 100%    Physical Exam Constitutional:      General: She is not in acute distress.    Appearance: Normal appearance. She is not toxic-appearing.  HENT:     Head: Normocephalic and atraumatic.  Eyes:     General: No scleral icterus. Cardiovascular:     Rate and Rhythm: Normal rate.     Pulses: Normal pulses.     Heart sounds: Murmur (Systolic murmur with a click.) heard.  Pulmonary:     Effort: Pulmonary effort is normal.     Breath sounds: No rhonchi.  Musculoskeletal:        General: No swelling (Bilateral lower extremity edema significantly improved.).     Cervical back: Neck supple.     Right lower leg: Edema (significantly better) present.     Left lower leg: Edema (significantly better) present.  Lymphadenopathy:     Cervical: No  cervical adenopathy.  Skin:    General: Skin is warm and dry.     Findings: No bruising or rash.  Neurological:     General: No focal deficit present.     Mental Status: She is alert.  Psychiatric:        Mood and Affect: Mood normal.        Behavior: Behavior normal.     LABORATORY DATA:  CBC    Component Value Date/Time   WBC 5.5 01/04/2023 1309   RBC 2.97 (L) 01/04/2023 1309   HGB 9.4 (L) 01/04/2023 1309   HGB 11.9 (L) 11/10/2022 1225   HCT 29.8 (L) 01/04/2023 1309   HCT 28.5 (L) 11/20/2018 1824   PLT 199 01/04/2023 1309   PLT 153 11/10/2022 1225   MCV 100.3 (H) 01/04/2023 1309   MCH 31.6 01/04/2023 1309   MCHC 31.5  01/04/2023 1309   RDW 18.2 (H) 01/04/2023 1309   LYMPHSABS 1.2 01/04/2023 1309   MONOABS 0.5 01/04/2023 1309   EOSABS 0.2 01/04/2023 1309   BASOSABS 0.1 01/04/2023 1309    CMP     Component Value Date/Time   NA 139 12/31/2022 0533   NA 137 12/06/2017 1436   K 3.8 12/31/2022 0533   CL 112 (H) 12/31/2022 0533   CO2 18 (L) 12/31/2022 0533   GLUCOSE 154 (H) 12/31/2022 0533   BUN 31 (H) 12/31/2022 0533   BUN 20 12/06/2017 1436   CREATININE 1.49 (H) 12/31/2022 0533   CREATININE 2.32 (H) 12/08/2022 1208   CALCIUM 8.4 (L) 12/31/2022 0533   PROT 5.7 (L) 12/27/2022 0509   ALBUMIN 2.9 (L) 12/30/2022 0517   AST 36 12/27/2022 0509   AST 60 (H) 12/08/2022 1208   ALT 21 12/27/2022 0509   ALT 42 12/08/2022 1208   ALKPHOS 105 12/27/2022 0509   BILITOT 0.4 12/27/2022 0509   BILITOT 0.7 12/08/2022 1208   GFRNONAA 35 (L) 12/31/2022 0533   GFRNONAA 20 (L) 12/08/2022 1208   GFRAA 32 (L) 03/30/2020 0932    ASSESSMENT and THERAPY PLAN:    [name] is a 82 y.o. female who presents for multifactorial anemia, previously treated with rituximab for ANCA positive hemolytic anemia, previously on maintenance rituximab every 12 weeks presented with worsening anemia thought to be related to her hemolysis.  She was diagnosed with elevated ANCA titers 1 is to 640  and previously treated for ANCA positive hemolytic anemia, seen by rheumatology Dr. Salome Holmes back in 2020, responded at that time to prednisone and rituximab and continued on rituximab maintenance.  However her rituximab was changed to every 79-month infusions and she relapsed before her second cycle of every 31-month rituximab.    # Multifactorial Anemia:  --Bone marrow biopsy from 02/20/2022 showed normocellular marrow with trilineage hematopoiesis. No leukemia, lymphoma or high-grade dysplasia identified. --CT CAP from 02/20/2022 did not show any underlying cause for anemia.  --ANCA titers are normal when checked on 02/10/2022 -- Currently on EPO every 2 weeks. Ok to proceed with it, Hb under 10. No indication for transfusion -- Today's hemoglobin is 9.4, okay to proceed with erythropoietin.  #Bilateral lower extremity edema significantly improved  #Warfarin therapy for St. Jude's valve --Under the care of  [facility].  I have recommended that she continue INR monitoring with the Coumadin clinic since we are not seeing her often enough.  She agrees and will follow-up with her clinic in [place].  She also wondered if she can get the labs and EPO shots in [place].  I can certainly talk to Dr. Delton Coombes and management and see if he would assume care.  Follow up: --Patient will return in 2 weeks for labs and epo. FU in 4 weeks.  I have spent a total of 30 minutes minutes of face-to-face and non-face-to-face time, preparing to see the patient, performing a medically appropriate examination, counseling and educating the patient, documenting clinical information in the electronic health record,and care coordination.   Benay Pike MD

## 2023-01-05 ENCOUNTER — Ambulatory Visit (INDEPENDENT_AMBULATORY_CARE_PROVIDER_SITE_OTHER): Payer: Medicare HMO | Admitting: Pharmacist

## 2023-01-05 DIAGNOSIS — Z5181 Encounter for therapeutic drug level monitoring: Secondary | ICD-10-CM

## 2023-01-05 DIAGNOSIS — Z952 Presence of prosthetic heart valve: Secondary | ICD-10-CM

## 2023-01-05 NOTE — Progress Notes (Signed)
LMOM

## 2023-01-05 NOTE — Patient Instructions (Addendum)
Description   Called and spoke to pt's daughter and son and instructed pt to take 1 tablet of warfarin today and 1/2 a tablet of warfarin tomorrow (while she is on flagyl should finish 3/30) then continue to take warfarin 1 tablet daily. Recheck INR in 1 week in [place].

## 2023-01-08 DIAGNOSIS — E1151 Type 2 diabetes mellitus with diabetic peripheral angiopathy without gangrene: Secondary | ICD-10-CM | POA: Diagnosis not present

## 2023-01-08 DIAGNOSIS — I130 Hypertensive heart and chronic kidney disease with heart failure and stage 1 through stage 4 chronic kidney disease, or unspecified chronic kidney disease: Secondary | ICD-10-CM | POA: Diagnosis not present

## 2023-01-08 DIAGNOSIS — D631 Anemia in chronic kidney disease: Secondary | ICD-10-CM | POA: Diagnosis not present

## 2023-01-08 DIAGNOSIS — E43 Unspecified severe protein-calorie malnutrition: Secondary | ICD-10-CM | POA: Diagnosis not present

## 2023-01-08 DIAGNOSIS — I5032 Chronic diastolic (congestive) heart failure: Secondary | ICD-10-CM | POA: Diagnosis not present

## 2023-01-08 DIAGNOSIS — N184 Chronic kidney disease, stage 4 (severe): Secondary | ICD-10-CM | POA: Diagnosis not present

## 2023-01-08 DIAGNOSIS — E1122 Type 2 diabetes mellitus with diabetic chronic kidney disease: Secondary | ICD-10-CM | POA: Diagnosis not present

## 2023-01-08 DIAGNOSIS — I2510 Atherosclerotic heart disease of native coronary artery without angina pectoris: Secondary | ICD-10-CM | POA: Diagnosis not present

## 2023-01-08 DIAGNOSIS — I272 Pulmonary hypertension, unspecified: Secondary | ICD-10-CM | POA: Diagnosis not present

## 2023-01-09 DIAGNOSIS — I5032 Chronic diastolic (congestive) heart failure: Secondary | ICD-10-CM | POA: Diagnosis not present

## 2023-01-09 DIAGNOSIS — N184 Chronic kidney disease, stage 4 (severe): Secondary | ICD-10-CM | POA: Diagnosis not present

## 2023-01-09 DIAGNOSIS — D631 Anemia in chronic kidney disease: Secondary | ICD-10-CM | POA: Diagnosis not present

## 2023-01-09 DIAGNOSIS — I7782 Antineutrophilic cytoplasmic antibody (ANCA) vasculitis: Secondary | ICD-10-CM | POA: Diagnosis not present

## 2023-01-09 DIAGNOSIS — I130 Hypertensive heart and chronic kidney disease with heart failure and stage 1 through stage 4 chronic kidney disease, or unspecified chronic kidney disease: Secondary | ICD-10-CM | POA: Diagnosis not present

## 2023-01-09 DIAGNOSIS — E1122 Type 2 diabetes mellitus with diabetic chronic kidney disease: Secondary | ICD-10-CM | POA: Diagnosis not present

## 2023-01-09 DIAGNOSIS — M109 Gout, unspecified: Secondary | ICD-10-CM | POA: Diagnosis not present

## 2023-01-13 ENCOUNTER — Encounter (HOSPITAL_COMMUNITY): Payer: Self-pay | Admitting: Family Medicine

## 2023-01-13 ENCOUNTER — Emergency Department (HOSPITAL_COMMUNITY): Payer: Medicare HMO

## 2023-01-13 ENCOUNTER — Inpatient Hospital Stay (HOSPITAL_COMMUNITY)
Admission: EM | Admit: 2023-01-13 | Discharge: 2023-01-26 | DRG: 682 | Disposition: A | Discharge status: Home Health | Payer: Medicare HMO | Attending: Family Medicine | Admitting: Family Medicine

## 2023-01-13 ENCOUNTER — Other Ambulatory Visit: Payer: Self-pay

## 2023-01-13 DIAGNOSIS — Z794 Long term (current) use of insulin: Secondary | ICD-10-CM

## 2023-01-13 DIAGNOSIS — E7800 Pure hypercholesterolemia, unspecified: Secondary | ICD-10-CM | POA: Diagnosis present

## 2023-01-13 DIAGNOSIS — I6529 Occlusion and stenosis of unspecified carotid artery: Secondary | ICD-10-CM | POA: Diagnosis present

## 2023-01-13 DIAGNOSIS — E1122 Type 2 diabetes mellitus with diabetic chronic kidney disease: Secondary | ICD-10-CM | POA: Diagnosis present

## 2023-01-13 DIAGNOSIS — I480 Paroxysmal atrial fibrillation: Secondary | ICD-10-CM | POA: Diagnosis present

## 2023-01-13 DIAGNOSIS — I2510 Atherosclerotic heart disease of native coronary artery without angina pectoris: Secondary | ICD-10-CM | POA: Diagnosis present

## 2023-01-13 DIAGNOSIS — Z951 Presence of aortocoronary bypass graft: Secondary | ICD-10-CM

## 2023-01-13 DIAGNOSIS — R1084 Generalized abdominal pain: Secondary | ICD-10-CM | POA: Diagnosis not present

## 2023-01-13 DIAGNOSIS — Z8249 Family history of ischemic heart disease and other diseases of the circulatory system: Secondary | ICD-10-CM

## 2023-01-13 DIAGNOSIS — J440 Chronic obstructive pulmonary disease with acute lower respiratory infection: Secondary | ICD-10-CM | POA: Diagnosis present

## 2023-01-13 DIAGNOSIS — N189 Chronic kidney disease, unspecified: Secondary | ICD-10-CM

## 2023-01-13 DIAGNOSIS — L89152 Pressure ulcer of sacral region, stage 2: Secondary | ICD-10-CM | POA: Diagnosis present

## 2023-01-13 DIAGNOSIS — F32A Depression, unspecified: Secondary | ICD-10-CM | POA: Diagnosis present

## 2023-01-13 DIAGNOSIS — K529 Noninfective gastroenteritis and colitis, unspecified: Secondary | ICD-10-CM | POA: Diagnosis present

## 2023-01-13 DIAGNOSIS — J90 Pleural effusion, not elsewhere classified: Secondary | ICD-10-CM | POA: Diagnosis not present

## 2023-01-13 DIAGNOSIS — N179 Acute kidney failure, unspecified: Secondary | ICD-10-CM | POA: Diagnosis not present

## 2023-01-13 DIAGNOSIS — Z79899 Other long term (current) drug therapy: Secondary | ICD-10-CM

## 2023-01-13 DIAGNOSIS — Z87891 Personal history of nicotine dependence: Secondary | ICD-10-CM

## 2023-01-13 DIAGNOSIS — I130 Hypertensive heart and chronic kidney disease with heart failure and stage 1 through stage 4 chronic kidney disease, or unspecified chronic kidney disease: Secondary | ICD-10-CM | POA: Diagnosis not present

## 2023-01-13 DIAGNOSIS — D631 Anemia in chronic kidney disease: Secondary | ICD-10-CM | POA: Diagnosis present

## 2023-01-13 DIAGNOSIS — I779 Disorder of arteries and arterioles, unspecified: Secondary | ICD-10-CM | POA: Diagnosis present

## 2023-01-13 DIAGNOSIS — I5032 Chronic diastolic (congestive) heart failure: Secondary | ICD-10-CM | POA: Diagnosis present

## 2023-01-13 DIAGNOSIS — J189 Pneumonia, unspecified organism: Secondary | ICD-10-CM | POA: Diagnosis not present

## 2023-01-13 DIAGNOSIS — R197 Diarrhea, unspecified: Secondary | ICD-10-CM | POA: Diagnosis not present

## 2023-01-13 DIAGNOSIS — G894 Chronic pain syndrome: Secondary | ICD-10-CM | POA: Diagnosis present

## 2023-01-13 DIAGNOSIS — Z83438 Family history of other disorder of lipoprotein metabolism and other lipidemia: Secondary | ICD-10-CM

## 2023-01-13 DIAGNOSIS — E119 Type 2 diabetes mellitus without complications: Secondary | ICD-10-CM

## 2023-01-13 DIAGNOSIS — Z95828 Presence of other vascular implants and grafts: Secondary | ICD-10-CM

## 2023-01-13 DIAGNOSIS — E1151 Type 2 diabetes mellitus with diabetic peripheral angiopathy without gangrene: Secondary | ICD-10-CM | POA: Diagnosis present

## 2023-01-13 DIAGNOSIS — Z66 Do not resuscitate: Secondary | ICD-10-CM | POA: Diagnosis present

## 2023-01-13 DIAGNOSIS — J9601 Acute respiratory failure with hypoxia: Secondary | ICD-10-CM | POA: Diagnosis not present

## 2023-01-13 DIAGNOSIS — E860 Dehydration: Secondary | ICD-10-CM

## 2023-01-13 DIAGNOSIS — Z888 Allergy status to other drugs, medicaments and biological substances status: Secondary | ICD-10-CM

## 2023-01-13 DIAGNOSIS — K219 Gastro-esophageal reflux disease without esophagitis: Secondary | ICD-10-CM | POA: Diagnosis present

## 2023-01-13 DIAGNOSIS — I10 Essential (primary) hypertension: Secondary | ICD-10-CM | POA: Diagnosis present

## 2023-01-13 DIAGNOSIS — M2550 Pain in unspecified joint: Secondary | ICD-10-CM | POA: Diagnosis present

## 2023-01-13 DIAGNOSIS — Z882 Allergy status to sulfonamides status: Secondary | ICD-10-CM

## 2023-01-13 DIAGNOSIS — Z881 Allergy status to other antibiotic agents status: Secondary | ICD-10-CM

## 2023-01-13 DIAGNOSIS — N181 Chronic kidney disease, stage 1: Secondary | ICD-10-CM | POA: Diagnosis not present

## 2023-01-13 DIAGNOSIS — I959 Hypotension, unspecified: Secondary | ICD-10-CM | POA: Diagnosis not present

## 2023-01-13 DIAGNOSIS — Z885 Allergy status to narcotic agent status: Secondary | ICD-10-CM

## 2023-01-13 DIAGNOSIS — I482 Chronic atrial fibrillation, unspecified: Secondary | ICD-10-CM | POA: Diagnosis not present

## 2023-01-13 DIAGNOSIS — R109 Unspecified abdominal pain: Secondary | ICD-10-CM | POA: Diagnosis not present

## 2023-01-13 DIAGNOSIS — Z952 Presence of prosthetic heart valve: Secondary | ICD-10-CM | POA: Diagnosis not present

## 2023-01-13 DIAGNOSIS — Z880 Allergy status to penicillin: Secondary | ICD-10-CM

## 2023-01-13 DIAGNOSIS — R0600 Dyspnea, unspecified: Secondary | ICD-10-CM | POA: Diagnosis not present

## 2023-01-13 DIAGNOSIS — E871 Hypo-osmolality and hyponatremia: Secondary | ICD-10-CM | POA: Diagnosis not present

## 2023-01-13 DIAGNOSIS — I272 Pulmonary hypertension, unspecified: Secondary | ICD-10-CM | POA: Diagnosis present

## 2023-01-13 DIAGNOSIS — N1832 Chronic kidney disease, stage 3b: Secondary | ICD-10-CM | POA: Diagnosis present

## 2023-01-13 DIAGNOSIS — F419 Anxiety disorder, unspecified: Secondary | ICD-10-CM | POA: Diagnosis present

## 2023-01-13 DIAGNOSIS — R0602 Shortness of breath: Secondary | ICD-10-CM | POA: Diagnosis not present

## 2023-01-13 DIAGNOSIS — R58 Hemorrhage, not elsewhere classified: Secondary | ICD-10-CM | POA: Diagnosis not present

## 2023-01-13 DIAGNOSIS — Z7901 Long term (current) use of anticoagulants: Secondary | ICD-10-CM

## 2023-01-13 DIAGNOSIS — I6523 Occlusion and stenosis of bilateral carotid arteries: Secondary | ICD-10-CM | POA: Diagnosis not present

## 2023-01-13 LAB — COMPREHENSIVE METABOLIC PANEL
ALT: 11 U/L (ref 0–44)
AST: 26 U/L (ref 15–41)
Albumin: 3.4 g/dL — ABNORMAL LOW (ref 3.5–5.0)
Alkaline Phosphatase: 109 U/L (ref 38–126)
Anion gap: 12 (ref 5–15)
BUN: 63 mg/dL — ABNORMAL HIGH (ref 8–23)
CO2: 16 mmol/L — ABNORMAL LOW (ref 22–32)
Calcium: 8.7 mg/dL — ABNORMAL LOW (ref 8.9–10.3)
Chloride: 99 mmol/L (ref 98–111)
Creatinine, Ser: 2.73 mg/dL — ABNORMAL HIGH (ref 0.44–1.00)
GFR, Estimated: 17 mL/min — ABNORMAL LOW (ref >60)
Glucose, Bld: 207 mg/dL — ABNORMAL HIGH (ref 70–99)
Potassium: 3.8 mmol/L (ref 3.5–5.1)
Sodium: 127 mmol/L — ABNORMAL LOW (ref 135–145)
Total Bilirubin: 0.7 mg/dL (ref 0.3–1.2)
Total Protein: 6.4 g/dL — ABNORMAL LOW (ref 6.5–8.1)

## 2023-01-13 LAB — CBC WITH DIFFERENTIAL/PLATELET
Abs Immature Granulocytes: 0.03 10*3/uL (ref 0.00–0.07)
Basophils Absolute: 0 10*3/uL (ref 0.0–0.1)
Basophils Relative: 0 %
Eosinophils Absolute: 0.1 10*3/uL (ref 0.0–0.5)
Eosinophils Relative: 1 %
HCT: 34.5 % — ABNORMAL LOW (ref 36.0–46.0)
Hemoglobin: 10.9 g/dL — ABNORMAL LOW (ref 12.0–15.0)
Immature Granulocytes: 0 %
Lymphocytes Relative: 9 %
Lymphs Abs: 1.2 10*3/uL (ref 0.7–4.0)
MCH: 31.2 pg (ref 26.0–34.0)
MCHC: 31.6 g/dL (ref 30.0–36.0)
MCV: 98.9 fL (ref 80.0–100.0)
Monocytes Absolute: 1 10*3/uL (ref 0.1–1.0)
Monocytes Relative: 8 %
Neutro Abs: 10.7 10*3/uL — ABNORMAL HIGH (ref 1.7–7.7)
Neutrophils Relative %: 82 %
Platelets: 157 10*3/uL (ref 150–400)
RBC: 3.49 MIL/uL — ABNORMAL LOW (ref 3.87–5.11)
RDW: 16.8 % — ABNORMAL HIGH (ref 11.5–15.5)
WBC: 13 10*3/uL — ABNORMAL HIGH (ref 4.0–10.5)
nRBC: 0 % (ref 0.0–0.2)

## 2023-01-13 LAB — URINALYSIS, ROUTINE W REFLEX MICROSCOPIC
Bilirubin Urine: NEGATIVE
Glucose, UA: NEGATIVE mg/dL
Hgb urine dipstick: NEGATIVE
Ketones, ur: NEGATIVE mg/dL
Leukocytes,Ua: NEGATIVE
Nitrite: NEGATIVE
Protein, ur: 100 mg/dL — AB
Specific Gravity, Urine: 1.008 (ref 1.005–1.030)
pH: 5 (ref 5.0–8.0)

## 2023-01-13 LAB — PROTIME-INR
INR: 2.6 — ABNORMAL HIGH (ref 0.8–1.2)
Prothrombin Time: 27.2 seconds — ABNORMAL HIGH (ref 11.4–15.2)

## 2023-01-13 LAB — LIPASE, BLOOD: Lipase: 27 U/L (ref 11–51)

## 2023-01-13 MED ORDER — SERTRALINE HCL 50 MG PO TABS
25.0000 mg | ORAL_TABLET | Freq: Every day | ORAL | Status: DC
  Administered 2023-01-13 – 2023-01-26 (×14): 25 mg via ORAL
  Filled 2023-01-13 (×14): qty 1

## 2023-01-13 MED ORDER — WARFARIN SODIUM 2.5 MG PO TABS
5.0000 mg | ORAL_TABLET | Freq: Once | ORAL | Status: AC
  Administered 2023-01-13: 5 mg via ORAL
  Filled 2023-01-13 (×2): qty 2

## 2023-01-13 MED ORDER — HYDRALAZINE HCL 25 MG PO TABS
100.0000 mg | ORAL_TABLET | Freq: Three times a day (TID) | ORAL | Status: DC
  Administered 2023-01-13 – 2023-01-26 (×38): 100 mg via ORAL
  Filled 2023-01-13 (×38): qty 4

## 2023-01-13 MED ORDER — WARFARIN SODIUM 2.5 MG PO TABS: 5.0000 mg | ORAL_TABLET | ORAL | Status: DC

## 2023-01-13 MED ORDER — FENTANYL CITRATE PF 50 MCG/ML IJ SOSY
50.0000 ug | PREFILLED_SYRINGE | Freq: Once | INTRAMUSCULAR | Status: AC
  Administered 2023-01-13: 50 ug via INTRAVENOUS
  Filled 2023-01-13: qty 1

## 2023-01-13 MED ORDER — ONDANSETRON 4 MG PO TBDP
4.0000 mg | ORAL_TABLET | Freq: Three times a day (TID) | ORAL | Status: DC | PRN
  Administered 2023-01-14 – 2023-01-25 (×10): 4 mg via ORAL
  Filled 2023-01-13 (×10): qty 1

## 2023-01-13 MED ORDER — PANTOPRAZOLE SODIUM 40 MG PO TBEC
40.0000 mg | DELAYED_RELEASE_TABLET | Freq: Two times a day (BID) | ORAL | Status: DC
  Administered 2023-01-13 – 2023-01-26 (×26): 40 mg via ORAL
  Filled 2023-01-13 (×28): qty 1

## 2023-01-13 MED ORDER — ALLOPURINOL 100 MG PO TABS
100.0000 mg | ORAL_TABLET | Freq: Every day | ORAL | Status: DC
  Administered 2023-01-14 – 2023-01-26 (×13): 100 mg via ORAL
  Filled 2023-01-13 (×13): qty 1

## 2023-01-13 MED ORDER — SODIUM CHLORIDE 0.9 % IV SOLN
500.0000 mg | INTRAVENOUS | Status: AC
  Administered 2023-01-13 – 2023-01-14 (×2): 500 mg via INTRAVENOUS
  Filled 2023-01-13 (×2): qty 5

## 2023-01-13 MED ORDER — TADALAFIL (PAH) 20 MG PO TABS
40.0000 mg | ORAL_TABLET | Freq: Every day | ORAL | Status: DC
  Administered 2023-01-14 – 2023-01-26 (×13): 40 mg via ORAL
  Filled 2023-01-13 (×18): qty 2

## 2023-01-13 MED ORDER — METOPROLOL SUCCINATE ER 25 MG PO TB24
25.0000 mg | ORAL_TABLET | Freq: Every day | ORAL | Status: DC
  Administered 2023-01-14 – 2023-01-26 (×13): 25 mg via ORAL
  Filled 2023-01-13 (×13): qty 1

## 2023-01-13 MED ORDER — LACTATED RINGERS IV BOLUS
1000.0000 mL | Freq: Once | INTRAVENOUS | Status: AC
  Administered 2023-01-13: 1000 mL via INTRAVENOUS

## 2023-01-13 MED ORDER — ATORVASTATIN CALCIUM 40 MG PO TABS
80.0000 mg | ORAL_TABLET | Freq: Every day | ORAL | Status: DC
  Administered 2023-01-14 – 2023-01-25 (×12): 80 mg via ORAL
  Filled 2023-01-13 (×12): qty 2

## 2023-01-13 MED ORDER — AMLODIPINE BESYLATE 5 MG PO TABS
10.0000 mg | ORAL_TABLET | Freq: Every day | ORAL | Status: DC
  Administered 2023-01-14 – 2023-01-26 (×13): 10 mg via ORAL
  Filled 2023-01-13 (×13): qty 2

## 2023-01-13 MED ORDER — ALPRAZOLAM 0.5 MG PO TABS
0.5000 mg | ORAL_TABLET | Freq: Every day | ORAL | Status: DC
  Administered 2023-01-13 – 2023-01-19 (×7): 0.5 mg via ORAL
  Filled 2023-01-13 (×7): qty 1

## 2023-01-13 MED ORDER — CLONIDINE HCL 0.2 MG/24HR TD PTWK
0.3000 mg | MEDICATED_PATCH | TRANSDERMAL | Status: DC
  Administered 2023-01-13 – 2023-01-20 (×2): 0.3 mg via TRANSDERMAL
  Filled 2023-01-13 (×2): qty 1

## 2023-01-13 MED ORDER — FENTANYL 25 MCG/HR TD PT72
1.0000 | MEDICATED_PATCH | TRANSDERMAL | Status: DC
  Administered 2023-01-13 – 2023-01-25 (×5): 1 via TRANSDERMAL
  Filled 2023-01-13 (×5): qty 1

## 2023-01-13 MED ORDER — WARFARIN - PHARMACIST DOSING INPATIENT: Freq: Every day | Status: DC

## 2023-01-13 MED ORDER — FAMOTIDINE 20 MG PO TABS
20.0000 mg | ORAL_TABLET | Freq: Two times a day (BID) | ORAL | Status: DC
  Administered 2023-01-14: 20 mg via ORAL
  Filled 2023-01-13: qty 1

## 2023-01-13 MED ORDER — LOPERAMIDE HCL 2 MG PO CAPS
2.0000 mg | ORAL_CAPSULE | ORAL | Status: DC | PRN
  Administered 2023-01-14: 2 mg via ORAL
  Filled 2023-01-13: qty 1

## 2023-01-13 MED ORDER — ALPRAZOLAM 0.5 MG PO TABS: 0.5000 mg | ORAL_TABLET | Freq: Every day | ORAL | Status: DC

## 2023-01-13 NOTE — ED Provider Notes (Signed)
[place] EMERGENCY DEPARTMENT AT [facility] Provider Note  CSN: [id number] Arrival date & time: 01/13/23 1206  Chief Complaint(s) Abdominal Pain  HPI [name] is a 82 y.o. female with PMH CAD status post CABG, mechanical AVR on Coumadin, CKD iron deficiency on chronic iron, ANCA vasculitis, PAD status post stenting, pulmonary hypertension, insulin-dependent diabetes, HTN, urinary retention who presents emergency room for evaluation of abdominal pain.  Patient recently discharged on 12/31/2022 for abdominal pain and GI bleed as well as sepsis secondary to colitis.  Recently treated with Rocephin and Flagyl and discharged on Omnicef and Flagyl.  Patient states that over the last 2 days she has had worsening crampy abdominal pain with abdominal distention.  Endorses increased frequency and dysuria.  Known history of C. Difficile.  Denies chest pain, shortness of breath, headache, fever or other systemic symptoms.   Past Medical History Past Medical History:  Diagnosis Date   Aortic atherosclerosis (HCC) 01/23/2018   Atrial fibrillation (HCC)    Benign positional vertigo 10/06/2011   Carotid artery disease [facility])    Chemotherapy induced neutropenia (HCC) 07/21/2019   Cholelithiasis 01/23/2018   Cholelithiasis 01/23/2018   Chronic anticoagulation    Colovesical fistula, ruled out 04/14/2022   Coronary artery disease    status post coronary artery bypass grafting times 07/10/2004   Diabetes mellitus    Hypercholesteremia    Hypertension    Mechanical heart valve present    H. aortic valve replacement at the time of bypass surgery October 2005   Moderate to severe pulmonary hypertension (HCC)    Peripheral arterial disease (HCC)    history of left common iliac artery PTA and stenting for a chronic total occlusion 08/26/01   S/P cholecystectomy 03/06/2018   Patient Active Problem List   Diagnosis Date Noted   Protein-calorie malnutrition, severe 12/26/2022   Acute GI  bleeding 12/24/2022   Coagulopathy 12/24/2022   Melena 12/24/2022   Duodenitis 12/24/2022   Noninfectious gastroenteritis 12/24/2022   CAD, multiple vessel 12/24/2022   Acute on chronic heart failure with preserved ejection fraction (HFpEF) 08/29/2022   Acute exacerbation of CHF (congestive heart failure) 08/29/2022   Acute urinary retention 08/28/2022   Acute kidney injury superimposed on chronic kidney disease 08/27/2022   Constipation 08/27/2022   Nausea and vomiting 08/27/2022   Hypoalbuminemia due to protein-calorie malnutrition 08/27/2022   Type 2 diabetes mellitus with hyperglycemia 08/27/2022   Palliative care encounter    Goals of care, counseling/discussion    Encounter for therapeutic drug monitoring    GI bleed 07/09/2022   GI bleeding 07/08/2022   Acute on chronic anemia 07/08/2022   History of Clostridioides difficile colitis 07/08/2022   DNR (do not resuscitate)/DNI(Do Not Intubate) 07/08/2022   Anemia of chronic disease 07/07/2022   Pressure injury of skin 06/23/2022   UTI (urinary tract infection) 06/22/2022   Joint pain 06/09/2022   Parietoalveolar pneumopathy 06/09/2022   Primary osteoarthritis 06/09/2022   Rheumatoid factor positive 06/09/2022   C. difficile colitis 04/19/2022   Colitis 04/18/2022   PAH (pulmonary artery hypertension) 04/18/2022   Emphysematous cystitis 04/13/2022   Female bladder prolapse 04/13/2022   Diverticulitis 04/12/2022   Autoimmune hemolytic anemia 01/02/2022   Bilateral lower extremity edema 01/02/2022   Refractory anemia 12/15/2021   Abnormal chest x-ray 12/15/2021   Supratherapeutic INR 12/15/2021   Elevated troponin 12/15/2021   Unilateral primary osteoarthritis, right hip 05/31/2021   Midline cystocele 05/30/2021   Primary localized osteoarthritis of pelvic region and thigh 05/30/2021  Vaginal pain 05/30/2021   Asymmetric SNHL (sensorineural hearing loss) 01/06/2021   Referred otalgia of both ears 01/06/2021    Bilateral hearing loss 08/31/2020   Low back pain 02/20/2020   CKD (chronic kidney disease), stage IV 10/13/2019   Leukopenia due to antineoplastic chemotherapy 08/18/2019   Thrombophilia 06/19/2019   Encounter for general adult medical examination without abnormal findings 06/13/2019   Arteritis 06/06/2019   Hypertensive heart and chronic kidney disease with heart failure and stage 1 through stage 4 chronic kidney disease, or unspecified chronic kidney disease 06/06/2019   Muscle weakness 06/06/2019   Pancytopenia 05/28/2019   Dyspnea 05/28/2019   Vasculitis, ANCA positive 05/28/2019   Disorder of connective tissue 05/14/2019   Renal insufficiency 05/08/2019   PAF (paroxysmal atrial fibrillation) 04/07/2019   Iron deficiency anemia 02/28/2019   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 02/28/2019   Diabetic retinopathy associated with type 2 diabetes mellitus 09/02/2018   Chronic pain 06/19/2018   Non-thrombocytopenic purpura 05/21/2018   Gout 03/22/2018   Malnutrition of moderate degree 03/12/2018   Diabetes mellitus type 2, controlled 03/06/2018   Chronic atrial fibrillation 03/06/2018   H/O mechanical aortic valve replacement 03/06/2018   Moderate to severe pulmonary hypertension 03/06/2018   GERD without esophagitis 02/21/2018   Anxiety 02/21/2018   Chronic diastolic CHF (congestive heart failure) 02/21/2018   General weakness 02/02/2018   Thrombocytopenia 01/25/2018   Arm pain, diffuse 01/25/2018   Anemia 01/23/2018   Aortic atherosclerosis 01/23/2018   Age-related osteoporosis without current pathological fracture 05/24/2017   Carotid artery occlusion 05/24/2017   Generalized anxiety disorder 05/24/2017   Hearing loss 05/24/2017   Presence of prosthetic heart valve 05/24/2017   Carotid artery disease 08/18/2013   Peripheral arterial disease 08/18/2013   Diabetes mellitus without complication 08/18/2013   Essential hypertension     Hypercholesteremia    Mechanical heart valve present    Chronic anticoagulation    Home Medication(s) Prior to Admission medications   Medication Sig Start Date End Date Taking? Authorizing Provider  amLODipine (NORVASC) 10 MG tablet Take 1 tablet (10 mg total) by mouth daily. 12/31/22  Yes Shon Hale, MD  atorvastatin (LIPITOR) 80 MG tablet Take 1 tablet (80 mg total) by mouth daily at 6 PM. 11/22/18  Yes Glade [name], MD  cloNIDine (CATAPRES - DOSED IN MG/24 HR) 0.3 mg/24hr patch Place 1 patch (0.3 mg total) onto the skin every Sunday. Start on 12/31/22 12/31/22 12/31/23 Yes Emokpae, Courage, MD  fentaNYL (DURAGESIC) 25 MCG/HR Place 1 patch onto the skin every 3 (three) days. 01/01/23  Yes Shon Hale, MD  ferrous sulfate 325 (65 FE) MG tablet Take 1 tablet (325 mg total) by mouth daily with breakfast. 09/06/22  Yes Johnson, Clanford L, MD  furosemide (LASIX) 40 MG tablet Take 0.5-1 tablets (20-40 mg total) by mouth See admin instructions. -1 tablet (40 mg) every morning and 1/2 Tab (20 mg ) Qpm 12/31/22  Yes Emokpae, Courage, MD  hydrALAZINE (APRESOLINE) 100 MG tablet Take 1 tablet (100 mg total) by mouth 3 (three) times daily. 12/31/22  Yes Emokpae, Courage, MD  metoprolol succinate (TOPROL-XL) 25 MG 24 hr tablet Take 1 tablet (25 mg total) by mouth daily. 12/31/22  Yes Emokpae, Courage, MD  pantoprazole (PROTONIX) 40 MG tablet Take 1 tablet (40 mg total) by mouth 2 (two) times daily. 12/31/22  Yes Emokpae, Courage, MD  senna-docusate (SENOKOT-S) 8.6-50 MG tablet Take 2 tablets by mouth at bedtime. 12/31/22  Yes Shon Hale, MD  tadalafil,  PAH, (ADCIRCA) 20 MG tablet TAKE 2 TABLETS BY MOUTH ONCE DAILY 12/11/22  Yes Laurey Morale, MD  warfarin (COUMADIN) 5 MG tablet Take 1 tablet (5 mg total) by mouth See admin instructions. Take 1 tablet 5mg  on Tuesday/Thursday/Sunday and 1/2 Tab (2.5 mg) on MWFSat 12/31/22  Yes Emokpae, Courage, MD  acetaminophen (TYLENOL) 325 MG tablet Take 325 mg by  mouth every 6 (six) hours as needed for mild pain or fever.    [provider]  allopurinol (ZYLOPRIM) 100 MG tablet Take 100 mg by mouth daily.    [provider]  ALPRAZolam Prudy Feeler) 0.5 MG tablet Take 0.5 mg by mouth daily.    [provider]  Cholecalciferol (VITAMIN D3) 50 MCG (2000 UT) capsule Take 2,000 Units by mouth daily with lunch. 11/26/18   [provider]  Cyanocobalamin (VITAMIN B12) 1000 MCG TBCR Take 1,000 mcg by mouth daily with lunch.    [provider]  famotidine (PEPCID) 20 MG tablet Take 20 mg by mouth 2 (two) times daily.    [provider]  insulin aspart (NOVOLOG) 100 UNIT/ML injection Inject 2-8 Units into the skin 3 (three) times daily with meals. Per sliding scale, If blood sugar is 200 to 250, give 2 units. If blood sugar is 251 to 300, give 4 units. If blood sugar is 301 to 350, give 6 units. If blood sugar is greater than 350, give 8 units and call MD. 09/05/22   Cleora Fleet, MD  Insulin Syringe-Needle U-100 (B-D INS SYR ULTRAFINE .3CC/29G) 29G X 1/2" 0.3 ML MISC Use for sliding scale for breakthrough high blood sugar readings 02/24/22   Iruku, Burnice Logan, MD  lidocaine (LIDODERM) 5 % Place 1 patch onto the skin daily. Remove & Discard patch within 12 hours or as directed by MD 01/01/23   Shon Hale, MD  ondansetron (ZOFRAN-ODT) 4 MG disintegrating tablet Take 4 mg by mouth every 8 (eight) hours as needed for nausea or vomiting. 01/12/22   [provider]  sertraline (ZOLOFT) 25 MG tablet Take 25 mg by mouth daily.    [provider]  simethicone (GAS-X) 80 MG chewable tablet Chew 1 tablet (80 mg total) by mouth 4 (four) times daily as needed for flatulence. 04/16/22 04/16/23  Alberteen Sam, MD                                                                                                                                    Past Surgical History Past Surgical History:  Procedure  Laterality Date   anterior repair  2009   AORTIC VALVE REPLACEMENT  2005   Dr. Laneta Simmers   CARDIAC CATHETERIZATION  11/10/2004   40% right common illiac, 70% in stent restenosis of distal left common illiac,    CARDIAC CATHETERIZATION  05/18/2004   LAD 50-70% midstenosis, RCA dominant w/50% stenosis, 50% Right common Illiac artery ostial stenosis, 90% in stent  restenosis within midportion of left common illiac stent   Carotid Duplex  03/12/2012   RSA-elev. velocities suggestive of a 50-69% diameter reduction, Right&Left Bulb/Prox ICA-mild-mod.fibrous plaqueelevating Velocities abnormal study.   CHOLECYSTECTOMY N/A 03/01/2018   Procedure: LAPAROSCOPIC CHOLECYSTECTOMY;  Surgeon: Jimmye Norman, MD;  Location: [facility] OR;  Service: General;  Laterality: N/A;   COLONOSCOPY WITH PROPOFOL N/A 01/22/2018   Procedure: COLONOSCOPY WITH PROPOFOL;  Surgeon: Charlott Rakes, MD;  Location: [facility] ENDOSCOPY;  Service: Endoscopy;  Laterality: N/A;   ESOPHAGOGASTRODUODENOSCOPY N/A 07/13/2022   Procedure: ESOPHAGOGASTRODUODENOSCOPY (EGD);  Surgeon: Kerin Salen, MD;  Location: [facility] ENDOSCOPY;  Service: Gastroenterology;  Laterality: N/A;   ESOPHAGOGASTRODUODENOSCOPY (EGD) WITH PROPOFOL N/A 01/22/2018   Procedure: ESOPHAGOGASTRODUODENOSCOPY (EGD) WITH PROPOFOL;  Surgeon: Charlott Rakes, MD;  Location: [facility] ENDOSCOPY;  Service: Endoscopy;  Laterality: N/A;   ESOPHAGOGASTRODUODENOSCOPY (EGD) WITH PROPOFOL N/A 11/21/2018   Procedure: ESOPHAGOGASTRODUODENOSCOPY (EGD) WITH PROPOFOL;  Surgeon: Kerin Salen, MD;  Location: [facility] ENDOSCOPY;  Service: Gastroenterology;  Laterality: N/A;   ESOPHAGOGASTRODUODENOSCOPY (EGD) WITH PROPOFOL Left 02/27/2019   Procedure: ESOPHAGOGASTRODUODENOSCOPY (EGD) WITH PROPOFOL;  Surgeon: Willis Modena, MD;  Location: [facility] ENDOSCOPY;  Service: Endoscopy;  Laterality: Left;   GIVENS CAPSULE STUDY N/A 11/21/2018   Procedure: GIVENS CAPSULE STUDY;  Surgeon: Kerin Salen, MD;  Location: [facility] ENDOSCOPY;  Service:  Gastroenterology;  Laterality: N/A;  To be deployed during EGD   Lower Ext. Duplex  03/12/2012   Right Proximal CIA- vessel narrowing w/elevated velocities 0-49% diameter reduction. Right SFA-mild mixed density plaque throughout vessel.   NM MYOCAR PERF WALL MOTION  05/19/2010   protocol: Persantine, post stress EF 65%, negative for ischemia, low risk scan   RIGHT HEART CATH N/A 06/27/2018   Procedure: RIGHT HEART CATH;  Surgeon: Laurey Morale, MD;  Location: [facility] INVASIVE CV LAB;  Service: Cardiovascular;  Laterality: N/A;   TOTAL ABDOMINAL HYSTERECTOMY W/ BILATERAL SALPINGOOPHORECTOMY  1989   TRANSTHORACIC ECHOCARDIOGRAM  08/29/2012   Moderately calcified annulus of mitral valve, moderate regurg. of both mitral valve and tricuspid valve.    Family History Family History  Problem Relation Age of Onset   Heart disease Father    Hypertension Father    Hyperlipidemia Father    Breast cancer Neg Hx     Social History Social History   Tobacco Use   Smoking status: Former    Packs/day: 1.00    Years: 30.00    Additional pack years: 0.00    Total pack years: 30.00    Types: Cigarettes   Smokeless tobacco: Never   Tobacco comments:    quit smoking 2005  Vaping Use   Vaping Use: Never used  Substance Use Topics   Alcohol use: No    Alcohol/week: 0.0 standard drinks of alcohol   Drug use: No   Allergies Augmentin [amoxicillin-pot clavulanate], Ciprofloxacin, Codeine, Coreg [carvedilol], Diflucan [fluconazole], Levofloxacin in d5w, Losartan, Privigen [immune globulin (human)], Sulfamethoxazole-trimethoprim, Verapamil, Zetia [ezetimibe], Zocor [simvastatin - high dose], and Gatifloxacin  Review of Systems Review of Systems  Gastrointestinal:  Positive for abdominal distention, abdominal pain and diarrhea.  Genitourinary:  Positive for dysuria.    Physical Exam Vital Signs  I have reviewed the triage vital signs BP (!) 158/62   Pulse 92   Temp 98.2 F (36.8 C) (Oral)    Resp 16   Ht 4\' 10"  (1.473 m)   Wt 53.8 kg   SpO2 100%   BMI 24.79 kg/m   Physical Exam Vitals and nursing note reviewed.  Constitutional:  General: She is not in acute distress.    Appearance: She is well-developed.  HENT:     Head: Normocephalic and atraumatic.  Eyes:     Conjunctiva/sclera: Conjunctivae normal.  Cardiovascular:     Rate and Rhythm: Normal rate and regular rhythm.     Heart sounds: No murmur heard. Pulmonary:     Effort: Pulmonary effort is normal. No respiratory distress.     Breath sounds: Normal breath sounds.  Abdominal:     General: There is distension.     Palpations: Abdomen is soft.     Tenderness: There is abdominal tenderness in the suprapubic area and left lower quadrant.  Musculoskeletal:        General: No swelling.     Cervical back: Neck supple.  Skin:    General: Skin is warm and dry.     Capillary Refill: Capillary refill takes less than 2 seconds.  Neurological:     Mental Status: She is alert.  Psychiatric:        Mood and Affect: Mood normal.     ED Results and Treatments Labs (all labs ordered are listed, but only abnormal results are displayed) Labs Reviewed  CBC WITH DIFFERENTIAL/PLATELET - Abnormal; Notable for the following components:      Result Value   WBC 13.0 (*)    RBC 3.49 (*)    Hemoglobin 10.9 (*)    HCT 34.5 (*)    RDW 16.8 (*)    Neutro Abs 10.7 (*)    All other components within normal limits  COMPREHENSIVE METABOLIC PANEL - Abnormal; Notable for the following components:   Sodium 127 (*)    CO2 16 (*)    Glucose, Bld 207 (*)    BUN 63 (*)    Creatinine, Ser 2.73 (*)    Calcium 8.7 (*)    Total Protein 6.4 (*)    Albumin 3.4 (*)    GFR, Estimated 17 (*)    All other components within normal limits  LIPASE, BLOOD  URINALYSIS, ROUTINE W REFLEX MICROSCOPIC                                                                                                                          Radiology CT  ABDOMEN PELVIS WO CONTRAST  Result Date: 01/13/2023 CLINICAL DATA:  Abdominal pain, diarrhea EXAM: CT ABDOMEN AND PELVIS WITHOUT CONTRAST TECHNIQUE: Multidetector CT imaging of the abdomen and pelvis was performed following the standard protocol without IV contrast. RADIATION DOSE REDUCTION: This exam was performed according to the departmental dose-optimization program which includes automated exposure control, adjustment of the mA and/or kV according to patient size and/or use of iterative reconstruction technique. COMPARISON:  12/24/2022 FINDINGS: Lower chest: Heart is enlarged in size. Coronary artery calcifications are seen. Dense calcification is seen in mitral annulus. Small to moderate bilateral pleural effusions are seen. There is peribronchial thickening. Small linear patchy infiltrates are seen in the lower lung fields. Hepatobiliary: Surgical clips are  seen in gallbladder fossa. There is no significant dilation of bile ducts. Pancreas: Pancreas is smaller than usual in size. Spleen: There are calcified granulomas in spleen. Adrenals/Urinary Tract: Adrenals are unremarkable. There is no hydronephrosis. Extensive calcifications are seen in renal artery branches limiting evaluation for small nonobstructing renal stones. Ureters are not dilated. Urinary bladder is distended. There is no wall thickening in the bladder. Lower margin of the urinary bladder is below the level of pubic symphysis suggesting cystocele. Stomach/Bowel: Small hiatal hernia is seen. Stomach is not distended. There is no significant small bowel dilation. Appendix is not seen. There is no significant wall thickening in colon. Multiple diverticula are seen in the left colon without signs of focal diverticulitis. Vascular/Lymphatic: Extensive arterial calcifications are seen in aorta and its branches including small peripheral branches. Reproductive: Uterus is not seen.  No adnexal masses are seen. Other: Small ascites is present.  There  is no pneumoperitoneum. Musculoskeletal: Degenerative changes are noted in lumbar spine. Decrease in height of the body of L4 vertebra has not changed. Degenerative changes are noted in right hip. IMPRESSION: There is no evidence of intestinal obstruction or pneumoperitoneum. There is no hydronephrosis. Diverticulosis of colon without signs of focal diverticulitis. Minimal ascites. Cystocele. Small to moderate bilateral pleural effusions. Severe arteriosclerosis. Coronary artery disease. Lumbar spondylosis. Degenerative changes are noted in right hip. Electronically Signed   By: Ernie AvenaPalani  Rathinasamy M.D.   On: 01/13/2023 13:56    Pertinent labs & imaging results that were available during my care of the patient were reviewed by me and considered in my medical decision making (see MDM for details).  Medications Ordered in ED Medications  fentaNYL (SUBLIMAZE) injection 50 mcg (50 mcg Intravenous Given 01/13/23 1306)                                                                                                                                     Procedures .Critical Care  Performed by: Glendora ScoreKommor, Braulio Kiedrowski, MD Authorized by: Glendora ScoreKommor, Ara Mano, MD   Critical care provider statement:    Critical care time (minutes):  30   Critical care was necessary to treat or prevent imminent or life-threatening deterioration of the following conditions:  Dehydration   Critical care was time spent personally by me on the following activities:  Development of treatment plan with patient or surrogate, discussions with consultants, evaluation of patient's response to treatment, examination of patient, ordering and review of laboratory studies, ordering and review of radiographic studies, ordering and performing treatments and interventions, pulse oximetry, re-evaluation of patient's condition and review of old charts   (including critical care time)  Medical Decision Making / ED Course   This patient presents to the ED for  concern of abdominal pain, dysuria, diarrhea, this involves an extensive number of treatment options, and is a complaint that carries with it a high risk of complications and morbidity.  The differential diagnosis includes diverticulitis,  epiploic appendagitis, colitis, gastroenteritis, constipation, nephrolithiasis, inflammatory bowel disease, C. difficile  MDM: Patient seen emergency room for evaluation of abdominal pain.  Physical exam with abdominal distention and tenderness in the left lower quadrant and suprapubic region.  Laboratory evaluation with a leukocytosis to 13.0, hemoglobin 10.9, new worsening hyponatremia to 127, CO2 16, BUN 63, creatinine 2.73 likely prerenal AKI in the setting of persistent diarrhea.  Urinalysis without evidence of infection.  C. difficile and stool studies pending.  Fluid resuscitation begun for suspected dehydration in the setting of her diarrhea.  CT abdomen pelvis without contrast obtained which is reassuringly negative for acute pathology.  Given worsening renal function, persistent diarrhea and persistent abdominal pain, patient require hospital admission.  Patient admitted.     Additional history obtained: -Additional history obtained from Friend -External records from outside source obtained and reviewed including: Chart review including previous notes, labs, imaging, consultation notes   Lab Tests: -I ordered, reviewed, and interpreted labs.   The pertinent results include:   Labs Reviewed  CBC WITH DIFFERENTIAL/PLATELET - Abnormal; Notable for the following components:      Result Value   WBC 13.0 (*)    RBC 3.49 (*)    Hemoglobin 10.9 (*)    HCT 34.5 (*)    RDW 16.8 (*)    Neutro Abs 10.7 (*)    All other components within normal limits  COMPREHENSIVE METABOLIC PANEL - Abnormal; Notable for the following components:   Sodium 127 (*)    CO2 16 (*)    Glucose, Bld 207 (*)    BUN 63 (*)    Creatinine, Ser 2.73 (*)    Calcium 8.7 (*)    Total  Protein 6.4 (*)    Albumin 3.4 (*)    GFR, Estimated 17 (*)    All other components within normal limits  LIPASE, BLOOD  URINALYSIS, ROUTINE W REFLEX MICROSCOPIC     Imaging Studies ordered: I ordered imaging studies including CTAP without contrast I independently visualized and interpreted imaging. I agree with the radiologist interpretation   Medicines ordered and prescription drug management: Meds ordered this encounter  Medications   fentaNYL (SUBLIMAZE) injection 50 mcg    -I have reviewed the patients home medicines and have made adjustments as needed  Critical interventions IVF   Cardiac Monitoring: The patient was maintained on a cardiac monitor.  I personally viewed and interpreted the cardiac monitored which showed an underlying rhythm of: NSR  Social Determinants of Health:  Factors impacting patients care include: none   Reevaluation: After the interventions noted above, I reevaluated the patient and found that they have :improved  Co morbidities that complicate the patient evaluation  Past Medical History:  Diagnosis Date   Aortic atherosclerosis (HCC) 01/23/2018   Atrial fibrillation (HCC)    Benign positional vertigo 10/06/2011   Carotid artery disease (HCC)    Chemotherapy induced neutropenia (HCC) 07/21/2019   Cholelithiasis 01/23/2018   Cholelithiasis 01/23/2018   Chronic anticoagulation    Colovesical fistula, ruled out 04/14/2022   Coronary artery disease    status post coronary artery bypass grafting times 07/10/2004   Diabetes mellitus    Hypercholesteremia    Hypertension    Mechanical heart valve present    H. aortic valve replacement at the time of bypass surgery October 2005   Moderate to severe pulmonary hypertension (HCC)    Peripheral arterial disease (HCC)    history of left common iliac artery PTA and stenting for a chronic total  occlusion 08/26/01   S/P cholecystectomy 03/06/2018      Dispostion: I considered admission for  this patient, and due to worsening AKI, persistent diarrhea and abdominal pain patient require hospital admission     Final Clinical Impression(s) / ED Diagnoses Final diagnoses:  None     @PCDICTATION @    Glendora Score, MD 01/13/23 1826

## 2023-01-13 NOTE — ED Triage Notes (Signed)
Patient BIB by EMS from home for ABD pain and diarrhea for x2days, no vomiting, pain worse on mov't. Pt has seen small amount of blood when wiping, on thinners.  Pt dc'd two weeks ago for UTI.

## 2023-01-13 NOTE — ED Notes (Signed)
Patient transported to CT 

## 2023-01-13 NOTE — Progress Notes (Signed)
ANTICOAGULATION CONSULT NOTE  Pharmacy Consult for warfarin Indication: atrial fibrillation  Allergies  Allergen Reactions   Augmentin [Amoxicillin-Pot Clavulanate] Diarrhea and Nausea And Vomiting   Ciprofloxacin Diarrhea, Nausea Only and Other (See Comments)    Stomach ache   Codeine     Insomnia, anxiety   Coreg [Carvedilol] Other (See Comments)    Terrible cramping in the feet and had a lot of bowel movements, but not diarrhea   Diflucan [Fluconazole] Diarrhea   Levofloxacin In D5w     Vomiting, diarrhea, insomnia   Losartan Swelling and Other (See Comments)    Patient doesn't recall site of swelling   Privigen [Immune Globulin (Human)] Other (See Comments)    Severe/excruciating pain   Sulfamethoxazole-Trimethoprim Diarrhea, Nausea Only and Other (See Comments)    Stomach upset   Verapamil Hives   Zetia [Ezetimibe] Other (See Comments)    Reaction not recalled   Zocor [Simvastatin - High Dose] Other (See Comments)    Reaction not recalled   Gatifloxacin Rash and Other (See Comments)    Redness to skin around eye    Patient Measurements: Height: 4\' 10"  (147.3 cm) Weight: 56 kg (123 lb 7.3 oz) IBW/kg (Calculated) : 40.9  Vital Signs: Temp: 99.6 F (37.6 C) (04/06 1747) Temp Source: Oral (04/06 1747) BP: 162/60 (04/06 1747) Pulse Rate: 99 (04/06 1747)  Labs: Recent Labs    01/13/23 1220  HGB 10.9*  HCT 34.5*  PLT 157  CREATININE 2.73*    Estimated Creatinine Clearance: 11.4 mL/min (A) (by C-G formula based on SCr of 2.73 mg/dL (H)).   Medical History: Past Medical History:  Diagnosis Date   Aortic atherosclerosis (HCC) 01/23/2018   Atrial fibrillation (HCC)    Benign positional vertigo 10/06/2011   Carotid artery disease (HCC)    Chemotherapy induced neutropenia (HCC) 07/21/2019   Cholelithiasis 01/23/2018   Cholelithiasis 01/23/2018   Chronic anticoagulation    Colovesical fistula, ruled out 04/14/2022   Coronary artery disease    status post  coronary artery bypass grafting times 07/10/2004   Diabetes mellitus    Hypercholesteremia    Hypertension    Mechanical heart valve present    H. aortic valve replacement at the time of bypass surgery October 2005   Moderate to severe pulmonary hypertension (HCC)    Peripheral arterial disease (HCC)    history of left common iliac artery PTA and stenting for a chronic total occlusion 08/26/01   S/P cholecystectomy 03/06/2018    Assessment: 82 YOF on warfarin for atrial fibrillation admitted for abdominal pain and AKI. Pharmacy consulted to continue warfarin during admission.  Patient reports some blood when wiping and dark stool; however, does take iron daily. No concerns for GI bleeding at this time.  PTA regimen 5mg  daily - last dose 4/5 PM INR 2.6  Goal of Therapy:  INR 2-3 Monitor platelets by anticoagulation protocol: Yes   Plan:  Warfarin 5mg  PO once Daily INR/CBC  Ellis Savage, PharmD Clinical Pharmacist 01/13/2023,5:53 PM

## 2023-01-13 NOTE — H&P (Signed)
History and Physical    Patient: [name] ZOX:[id number] DOB: 02-14-1941 DOA: 01/13/2023 DOS: the patient was seen and examined on 01/13/2023 PCP: Antoine Poche, MD  Patient coming from: Home  Chief Complaint:  Chief Complaint  Patient presents with   Abdominal Pain   HPI: AARIYA [name] is a 82 y.o. female with medical history significant of atrial fibrillation, heart failure preserved EF, CAD, pulmonary hypertension, essential hypertension, diabetes secondary to kidney disease, ANCA vasculitis not currently on treatment.  Patient is on chronic anticoagulation.  She was recently admitted on 3/17 for colitis.  At the time she had significant diarrhea and was put on antibiotics.  She was discharged on 3/24.  Over the past 2 days, the patient has been experiencing worse cramping abdominal pain with abdominal distention.  She has been having increased diarrhea with multiple stools a day.  She denies fevers, chills.  Her stools are black but she takes iron on a daily basis.  She has been worked up for a GI bleed in the past which has been completely negative.  Incidentally, the patient has not replaced her fentanyl patch in over a week  Review of Systems: As mentioned in the history of present illness. All other systems reviewed and are negative. Past Medical History:  Diagnosis Date   Aortic atherosclerosis (HCC) 01/23/2018   Atrial fibrillation (HCC)    Benign positional vertigo 10/06/2011   Carotid artery disease (HCC)    Chemotherapy induced neutropenia (HCC) 07/21/2019   Cholelithiasis 01/23/2018   Cholelithiasis 01/23/2018   Chronic anticoagulation    Colovesical fistula, ruled out 04/14/2022   Coronary artery disease    status post coronary artery bypass grafting times 07/10/2004   Diabetes mellitus    Hypercholesteremia    Hypertension    Mechanical heart valve present    H. aortic valve replacement at the time of bypass surgery October 2005   Moderate to severe  pulmonary hypertension (HCC)    Peripheral arterial disease (HCC)    history of left common iliac artery PTA and stenting for a chronic total occlusion 08/26/01   S/P cholecystectomy 03/06/2018   Past Surgical History:  Procedure Laterality Date   anterior repair  2009   AORTIC VALVE REPLACEMENT  2005   Dr. Laneta Simmers   CARDIAC CATHETERIZATION  11/10/2004   40% right common illiac, 70% in stent restenosis of distal left common illiac,    CARDIAC CATHETERIZATION  05/18/2004   LAD 50-70% midstenosis, RCA dominant w/50% stenosis, 50% Right common Illiac artery ostial stenosis, 90% in stent restenosis within midportion of left common illiac stent   Carotid Duplex  03/12/2012   RSA-elev. velocities suggestive of a 50-69% diameter reduction, Right&Left Bulb/Prox ICA-mild-mod.fibrous plaqueelevating Velocities abnormal study.   CHOLECYSTECTOMY N/A 03/01/2018   Procedure: LAPAROSCOPIC CHOLECYSTECTOMY;  Surgeon: Jimmye Norman, MD;  Location: [facility] OR;  Service: General;  Laterality: N/A;   COLONOSCOPY WITH PROPOFOL N/A 01/22/2018   Procedure: COLONOSCOPY WITH PROPOFOL;  Surgeon: Charlott Rakes, MD;  Location: [facility] ENDOSCOPY;  Service: Endoscopy;  Laterality: N/A;   ESOPHAGOGASTRODUODENOSCOPY N/A 07/13/2022   Procedure: ESOPHAGOGASTRODUODENOSCOPY (EGD);  Surgeon: Kerin Salen, MD;  Location: [facility] ENDOSCOPY;  Service: Gastroenterology;  Laterality: N/A;   ESOPHAGOGASTRODUODENOSCOPY (EGD) WITH PROPOFOL N/A 01/22/2018   Procedure: ESOPHAGOGASTRODUODENOSCOPY (EGD) WITH PROPOFOL;  Surgeon: Charlott Rakes, MD;  Location: [facility] ENDOSCOPY;  Service: Endoscopy;  Laterality: N/A;   ESOPHAGOGASTRODUODENOSCOPY (EGD) WITH PROPOFOL N/A 11/21/2018   Procedure: ESOPHAGOGASTRODUODENOSCOPY (EGD) WITH PROPOFOL;  Surgeon: Kerin Salen, MD;  Location:  [facility] ENDOSCOPY;  Service: Gastroenterology;  Laterality: N/A;   ESOPHAGOGASTRODUODENOSCOPY (EGD) WITH PROPOFOL Left 02/27/2019   Procedure: ESOPHAGOGASTRODUODENOSCOPY (EGD) WITH PROPOFOL;   Surgeon: Willis Modenautlaw, William, MD;  [facility];  Service: Endoscopy;  Laterality: Left;   GIVENS CAPSULE STUDY N/A 11/21/2018   Procedure: GIVENS CAPSULE STUDY;  Surgeon: Kerin SalenKarki, Arya, MD;  [facility];  Service: Gastroenterology;  Laterality: N/A;  To be deployed during EGD   Lower Ext. Duplex  03/12/2012   Right Proximal CIA- vessel narrowing w/elevated velocities 0-49% diameter reduction. Right SFA-mild mixed density plaque throughout vessel.   NM MYOCAR PERF WALL MOTION  05/19/2010   protocol: Persantine, post stress EF 65%, negative for ischemia, low risk scan   RIGHT HEART CATH N/A 06/27/2018   Procedure: RIGHT HEART CATH;  Surgeon: Laurey MoraleMcLean, Dalton S, MD;  [facility];  Service: Cardiovascular;  Laterality: N/A;   TOTAL ABDOMINAL HYSTERECTOMY W/ BILATERAL SALPINGOOPHORECTOMY  1989   TRANSTHORACIC ECHOCARDIOGRAM  08/29/2012   Moderately calcified annulus of mitral valve, moderate regurg. of both mitral valve and tricuspid valve.    Social History:  reports that she has quit smoking. Her smoking use included cigarettes. She has a 30.00 pack-year smoking history. She has never used smokeless tobacco. She reports that she does not drink alcohol and does not use drugs.  Allergies  Allergen Reactions   Augmentin [Amoxicillin-Pot Clavulanate] Diarrhea and Nausea And Vomiting   Ciprofloxacin Diarrhea, Nausea Only and Other (See Comments)    Stomach ache   Codeine     Insomnia, anxiety   Coreg [Carvedilol] Other (See Comments)    Terrible cramping in the feet and had a lot of bowel movements, but not diarrhea   Diflucan [Fluconazole] Diarrhea   Levofloxacin In D5w     Vomiting, diarrhea, insomnia   Losartan Swelling and Other (See Comments)    Patient doesn't recall site of swelling   Privigen [Immune Globulin (Human)] Other (See Comments)    Severe/excruciating pain   Sulfamethoxazole-Trimethoprim Diarrhea, Nausea Only and Other (See Comments)    Stomach  upset   Verapamil Hives   Zetia [Ezetimibe] Other (See Comments)    Reaction not recalled   Zocor [Simvastatin - High Dose] Other (See Comments)    Reaction not recalled   Gatifloxacin Rash and Other (See Comments)    Redness to skin around eye    Family History  Problem Relation Age of Onset   Heart disease Father    Hypertension Father    Hyperlipidemia Father    Breast cancer Neg Hx     Prior to Admission medications   Medication Sig Start Date End Date Taking? Authorizing Provider  acetaminophen (TYLENOL) 325 MG tablet Take 325 mg by mouth every 6 (six) hours as needed for mild pain or fever.   Yes [provider]  allopurinol (ZYLOPRIM) 100 MG tablet Take 100 mg by mouth daily.   Yes [provider]  ALPRAZolam Prudy Feeler(XANAX) 0.5 MG tablet Take 0.5 mg by mouth daily.   Yes [provider]  amLODipine (NORVASC) 10 MG tablet Take 1 tablet (10 mg total) by mouth daily. 12/31/22  Yes Shon HaleEmokpae, Courage, MD  atorvastatin (LIPITOR) 80 MG tablet Take 1 tablet (80 mg total) by mouth daily at 6 PM. 11/22/18  Yes Glade LloydAlekh, Kshitiz, MD  Cholecalciferol (VITAMIN D3) 50 MCG (2000 UT) capsule Take 2,000 Units by mouth daily with lunch. 11/26/18  Yes [provider]  cloNIDine (CATAPRES - DOSED IN MG/24 HR) 0.3 mg/24hr  patch Place 1 patch (0.3 mg total) onto the skin every Sunday. Start on 12/31/22 12/31/22 12/31/23 Yes Emokpae, Courage, MD  Cyanocobalamin (VITAMIN B12) 1000 MCG TBCR Take 1,000 mcg by mouth daily with lunch.   Yes [provider]  famotidine (PEPCID) 20 MG tablet Take 20 mg by mouth 2 (two) times daily.   Yes [provider]  fentaNYL (DURAGESIC) 25 MCG/HR Place 1 patch onto the skin every 3 (three) days. 01/01/23  Yes Shon Hale, MD  ferrous sulfate 325 (65 FE) MG tablet Take 1 tablet (325 mg total) by mouth daily with breakfast. 09/06/22  Yes Johnson, Clanford L, MD  furosemide (LASIX) 40 MG tablet Take 0.5-1 tablets (20-40 mg total) by  mouth See admin instructions. -1 tablet (40 mg) every morning and 1/2 Tab (20 mg ) Qpm 12/31/22  Yes Emokpae, Courage, MD  hydrALAZINE (APRESOLINE) 100 MG tablet Take 1 tablet (100 mg total) by mouth 3 (three) times daily. 12/31/22  Yes Emokpae, Courage, MD  insulin aspart (NOVOLOG) 100 UNIT/ML injection Inject 2-8 Units into the skin 3 (three) times daily with meals. Per sliding scale, If blood sugar is 200 to 250, give 2 units. If blood sugar is 251 to 300, give 4 units. If blood sugar is 301 to 350, give 6 units. If blood sugar is greater than 350, give 8 units and call MD. 09/05/22  Yes Johnson, Clanford L, MD  metoprolol succinate (TOPROL-XL) 25 MG 24 hr tablet Take 1 tablet (25 mg total) by mouth daily. 12/31/22  Yes Emokpae, Courage, MD  ondansetron (ZOFRAN-ODT) 4 MG disintegrating tablet Take 4 mg by mouth every 8 (eight) hours as needed for nausea or vomiting. 01/12/22  Yes [provider]  pantoprazole (PROTONIX) 40 MG tablet Take 1 tablet (40 mg total) by mouth 2 (two) times daily. 12/31/22  Yes Emokpae, Courage, MD  senna-docusate (SENOKOT-S) 8.6-50 MG tablet Take 2 tablets by mouth at bedtime. 12/31/22  Yes Emokpae, Courage, MD  sertraline (ZOLOFT) 25 MG tablet Take 25 mg by mouth daily.   Yes [provider]  simethicone (GAS-X) 80 MG chewable tablet Chew 1 tablet (80 mg total) by mouth 4 (four) times daily as needed for flatulence. 04/16/22 04/16/23 Yes Danford, Earl Lites, MD  tadalafil, PAH, (ADCIRCA) 20 MG tablet TAKE 2 TABLETS BY MOUTH ONCE DAILY 12/11/22  Yes Laurey Morale, MD  warfarin (COUMADIN) 5 MG tablet Take 1 tablet (5 mg total) by mouth See admin instructions. Take 1 tablet 5mg  on Tuesday/Thursday/Sunday and 1/2 Tab (2.5 mg) on MWFSat Patient taking differently: Take 5 mg by mouth See admin instructions. Take 5 mg once daily. Pt's has not been checked in over a week. Per daughter 12/31/22  Yes Shon Hale, MD  Insulin Syringe-Needle U-100 (B-D INS SYR  ULTRAFINE .3CC/29G) 29G X 1/2" 0.3 ML MISC Use for sliding scale for breakthrough high blood sugar readings 02/24/22   Iruku, Burnice Logan, MD  lidocaine (LIDODERM) 5 % Place 1 patch onto the skin daily. Remove & Discard patch within 12 hours or as directed by MD Patient not taking: Reported on 01/13/2023 01/01/23   Shon Hale, MD    Physical Exam: Vitals:   01/13/23 1600 01/13/23 1635 01/13/23 1700 01/13/23 1709  BP: (!) 153/50  (!) 165/51   Pulse: 83  89   Resp: (!) 25  (!) 30 (!) 24  Temp:  98.6 F (37 C)    TempSrc:  Oral    SpO2: 97%  96%   Weight:  Height:       General: Elderly female. Awake and alert and oriented x3. No acute cardiopulmonary distress.  HEENT: Normocephalic atraumatic.  Right and left ears normal in appearance.  Pupils equal, round, reactive to light. Extraocular muscles are intact. Sclerae anicteric and noninjected.  Moist mucosal membranes. No mucosal lesions.  Neck: Neck supple without lymphadenopathy. No carotid bruits. No masses palpated.  Cardiovascular: Regular rate with normal S1-S2 sounds. No murmurs, rubs, gallops auscultated. No JVD.  Respiratory: Good respiratory effort with no wheezes, rales, rhonchi. Lungs clear to auscultation bilaterally.  No accessory muscle use. Abdomen: Abdominal tenderness, specifically in the right lower quadrant.  There is mild distention of the abdomen.  Hyperactive bowel sounds. No masses or hepatosplenomegaly  Skin: No rashes, lesions, or ulcerations.  Dry, warm to touch. 2+ dorsalis pedis and radial pulses. Musculoskeletal: No calf or leg pain. All major joints not erythematous nontender.  No upper or lower joint deformation.  Good ROM.  No contractures  Psychiatric: Intact judgment and insight. Pleasant and cooperative. Neurologic: No focal neurological deficits. Strength is 5/5 and symmetric in upper and lower extremities.  Cranial nerves II through XII are grossly intact.  Data Reviewed: Results for orders placed or  performed during the hospital encounter of 01/13/23 (from the past 24 hour(s))  CBC with Differential     Status: Abnormal   Collection Time: 01/13/23 12:20 PM  Result Value Ref Range   WBC 13.0 (H) 4.0 - 10.5 K/uL   RBC 3.49 (L) 3.87 - 5.11 MIL/uL   Hemoglobin 10.9 (L) 12.0 - 15.0 g/dL   HCT 45.4 (L) 09.8 - 11.9 %   MCV 98.9 80.0 - 100.0 fL   MCH 31.2 26.0 - 34.0 pg   MCHC 31.6 30.0 - 36.0 g/dL   RDW 14.7 (H) 82.9 - 56.2 %   Platelets 157 150 - 400 K/uL   nRBC 0.0 0.0 - 0.2 %   Neutrophils Relative % 82 %   Neutro Abs 10.7 (H) 1.7 - 7.7 K/uL   Lymphocytes Relative 9 %   Lymphs Abs 1.2 0.7 - 4.0 K/uL   Monocytes Relative 8 %   Monocytes Absolute 1.0 0.1 - 1.0 K/uL   Eosinophils Relative 1 %   Eosinophils Absolute 0.1 0.0 - 0.5 K/uL   Basophils Relative 0 %   Basophils Absolute 0.0 0.0 - 0.1 K/uL   Immature Granulocytes 0 %   Abs Immature Granulocytes 0.03 0.00 - 0.07 K/uL  Comprehensive metabolic panel     Status: Abnormal   Collection Time: 01/13/23 12:20 PM  Result Value Ref Range   Sodium 127 (L) 135 - 145 mmol/L   Potassium 3.8 3.5 - 5.1 mmol/L   Chloride 99 98 - 111 mmol/L   CO2 16 (L) 22 - 32 mmol/L   Glucose, Bld 207 (H) 70 - 99 mg/dL   BUN 63 (H) 8 - 23 mg/dL   Creatinine, Ser 1.30 (H) 0.44 - 1.00 mg/dL   Calcium 8.7 (L) 8.9 - 10.3 mg/dL   Total Protein 6.4 (L) 6.5 - 8.1 g/dL   Albumin 3.4 (L) 3.5 - 5.0 g/dL   AST 26 15 - 41 U/L   ALT 11 0 - 44 U/L   Alkaline Phosphatase 109 38 - 126 U/L   Total Bilirubin 0.7 0.3 - 1.2 mg/dL   GFR, Estimated 17 (L) >60 mL/min   Anion gap 12 5 - 15  Lipase, blood     Status: None   Collection Time: 01/13/23  12:20 PM  Result Value Ref Range   Lipase 27 11 - 51 U/L  Urinalysis, Routine w reflex microscopic -Urine, Clean Catch     Status: Abnormal   Collection Time: 01/13/23  1:09 PM  Result Value Ref Range   Color, Urine YELLOW YELLOW   APPearance HAZY (A) CLEAR   Specific Gravity, Urine 1.008 1.005 - 1.030   pH 5.0 5.0 -  8.0   Glucose, UA NEGATIVE NEGATIVE mg/dL   Hgb urine dipstick NEGATIVE NEGATIVE   Bilirubin Urine NEGATIVE NEGATIVE   Ketones, ur NEGATIVE NEGATIVE mg/dL   Protein, ur 161 (A) NEGATIVE mg/dL   Nitrite NEGATIVE NEGATIVE   Leukocytes,Ua NEGATIVE NEGATIVE   RBC / HPF 0-5 0 - 5 RBC/hpf   WBC, UA 6-10 0 - 5 WBC/hpf   Bacteria, UA RARE (A) NONE SEEN   Squamous Epithelial / HPF 0-5 0 - 5 /HPF   Hyaline Casts, UA PRESENT    Amorphous Crystal PRESENT    *Note: Due to a large number of results and/or encounters for the requested time period, some results have not been displayed. A complete set of results can be found in Results Review.     Assessment and Plan: No notes have been filed under this hospital service. Service: Hospitalist  Principal Problem:   Acute kidney injury superimposed on chronic kidney disease Active Problems:   Chronic diastolic CHF (congestive heart failure)   Diabetes mellitus type 2, controlled   Essential hypertension   Carotid artery disease   Chronic atrial fibrillation   Carotid artery occlusion   Colitis   Joint pain  Acute kidney injury superimposed on chronic kidney disease IV fluids Recheck serum creatinine in the morning Colitis Possibly multifactorial: Patient has been on antibiotics so possibly C. difficile colitis.  Will obtain C. difficile stool sample and treat with oral vancomycin if positive Due to the patient's age and risk factors, will treat with azithromycin for bacterial colitis, especially given the patient's elevated white count. Additionally, the patient may be having a component of opioid withdrawal given her fentanyl patch was last replaced a week ago.  The waning of her fentanyl patch coincides to approximately when her symptoms started.  Will replace patch and see how this improves symptoms. Lomotil as needed. Hypertension Continue antihypertensives Diabetes Soft diet Sliding scale insulin HFpEF Will be cautious with fluid  boluses CAD AFib On Coumadin No evidence of GI bleed Pharmacy consult for Coumadin management Chronic Pain   Advance Care Planning:   Code Status: DNR confirmed by patient  Consults: Pharmacy  Family Communication: Patient's daughter present during interview and exam  Severity of Illness: The appropriate patient status for this patient is INPATIENT. Inpatient status is judged to be reasonable and necessary in order to provide the required intensity of service to ensure the patient's safety. [name] presenting symptoms, physical exam findings, and initial radiographic and laboratory data in the context of their chronic comorbidities is felt to place them at high risk for further clinical deterioration. Furthermore, it is not anticipated that the patient will be medically stable for discharge from the hospital within 2 midnights of admission.   * I certify that at the point of admission it is my clinical judgment that the patient will require inpatient hospital care spanning beyond 2 midnights from the point of admission due to high intensity of service, high risk for further deterioration and high frequency of surveillance required.*  Author: Levie Heritage, DO 01/13/2023 5:37 PM  For  on call review [email or web address].

## 2023-01-13 NOTE — ED Notes (Signed)
Pt states pain started this am in lower abdominal area. Stabbing like pain, unable to put on number scale at this time

## 2023-01-14 DIAGNOSIS — N189 Chronic kidney disease, unspecified: Secondary | ICD-10-CM | POA: Diagnosis not present

## 2023-01-14 DIAGNOSIS — N179 Acute kidney failure, unspecified: Secondary | ICD-10-CM | POA: Diagnosis not present

## 2023-01-14 LAB — PROTIME-INR
INR: 2.9 — ABNORMAL HIGH (ref 0.8–1.2)
Prothrombin Time: 30.1 seconds — ABNORMAL HIGH (ref 11.4–15.2)

## 2023-01-14 LAB — BASIC METABOLIC PANEL
Anion gap: 10 (ref 5–15)
BUN: 61 mg/dL — ABNORMAL HIGH (ref 8–23)
CO2: 19 mmol/L — ABNORMAL LOW (ref 22–32)
Calcium: 8.5 mg/dL — ABNORMAL LOW (ref 8.9–10.3)
Chloride: 104 mmol/L (ref 98–111)
Creatinine, Ser: 2.4 mg/dL — ABNORMAL HIGH (ref 0.44–1.00)
GFR, Estimated: 19 mL/min — ABNORMAL LOW (ref >60)
Glucose, Bld: 100 mg/dL — ABNORMAL HIGH (ref 70–99)
Potassium: 3.5 mmol/L (ref 3.5–5.1)
Sodium: 133 mmol/L — ABNORMAL LOW (ref 135–145)

## 2023-01-14 LAB — CBC
HCT: 29.7 % — ABNORMAL LOW (ref 36.0–46.0)
Hemoglobin: 9.3 g/dL — ABNORMAL LOW (ref 12.0–15.0)
MCH: 31.1 pg (ref 26.0–34.0)
MCHC: 31.3 g/dL (ref 30.0–36.0)
MCV: 99.3 fL (ref 80.0–100.0)
Platelets: 149 10*3/uL — ABNORMAL LOW (ref 150–400)
RBC: 2.99 MIL/uL — ABNORMAL LOW (ref 3.87–5.11)
RDW: 16.4 % — ABNORMAL HIGH (ref 11.5–15.5)
WBC: 11.6 10*3/uL — ABNORMAL HIGH (ref 4.0–10.5)
nRBC: 0 % (ref 0.0–0.2)

## 2023-01-14 LAB — GLUCOSE, CAPILLARY: Glucose-Capillary: 119 mg/dL — ABNORMAL HIGH (ref 70–99)

## 2023-01-14 MED ORDER — LABETALOL HCL 5 MG/ML IV SOLN
10.0000 mg | INTRAVENOUS | Status: DC | PRN
  Filled 2023-01-14: qty 4

## 2023-01-14 MED ORDER — FAMOTIDINE 20 MG PO TABS
20.0000 mg | ORAL_TABLET | Freq: Every day | ORAL | Status: DC
  Administered 2023-01-15 – 2023-01-26 (×12): 20 mg via ORAL
  Filled 2023-01-14 (×11): qty 1

## 2023-01-14 MED ORDER — INSULIN ASPART 100 UNIT/ML IJ SOLN
0.0000 [IU] | Freq: Every day | INTRAMUSCULAR | Status: DC
  Administered 2023-01-19: 1 [IU] via SUBCUTANEOUS

## 2023-01-14 MED ORDER — WARFARIN SODIUM 2.5 MG PO TABS
5.0000 mg | ORAL_TABLET | Freq: Once | ORAL | Status: AC
  Administered 2023-01-14: 5 mg via ORAL
  Filled 2023-01-14: qty 2

## 2023-01-14 MED ORDER — INSULIN ASPART 100 UNIT/ML IJ SOLN
0.0000 [IU] | Freq: Three times a day (TID) | INTRAMUSCULAR | Status: DC
  Administered 2023-01-15 – 2023-01-18 (×10): 1 [IU] via SUBCUTANEOUS
  Administered 2023-01-19 – 2023-01-20 (×2): 2 [IU] via SUBCUTANEOUS
  Administered 2023-01-21 – 2023-01-23 (×3): 1 [IU] via SUBCUTANEOUS
  Administered 2023-01-24 – 2023-01-25 (×2): 2 [IU] via SUBCUTANEOUS
  Administered 2023-01-26 (×2): 1 [IU] via SUBCUTANEOUS

## 2023-01-14 NOTE — Progress Notes (Signed)
ANTICOAGULATION CONSULT NOTE  Pharmacy Consult for warfarin Indication: atrial fibrillation/ mechanical valve  Allergies  Allergen Reactions   Augmentin [Amoxicillin-Pot Clavulanate] Diarrhea and Nausea And Vomiting   Ciprofloxacin Diarrhea, Nausea Only and Other (See Comments)    Stomach ache   Codeine     Insomnia, anxiety   Coreg [Carvedilol] Other (See Comments)    Terrible cramping in the feet and had a lot of bowel movements, but not diarrhea   Diflucan [Fluconazole] Diarrhea   Levofloxacin In D5w     Vomiting, diarrhea, insomnia   Losartan Swelling and Other (See Comments)    Patient doesn't recall site of swelling   Privigen [Immune Globulin (Human)] Other (See Comments)    Severe/excruciating pain   Sulfamethoxazole-Trimethoprim Diarrhea, Nausea Only and Other (See Comments)    Stomach upset   Verapamil Hives   Zetia [Ezetimibe] Other (See Comments)    Reaction not recalled   Zocor [Simvastatin - High Dose] Other (See Comments)    Reaction not recalled   Gatifloxacin Rash and Other (See Comments)    Redness to skin around eye    Patient Measurements: Height: 4\' 10"  (147.3 cm) Weight: 56 kg (123 lb 7.3 oz) IBW/kg (Calculated) : 40.9  Vital Signs: Temp: 98.6 F (37 C) (04/07 0423) Temp Source: Oral (04/07 0423) BP: 162/56 (04/07 0423) Pulse Rate: 105 (04/07 0423)  Labs: Recent Labs    01/13/23 1220 01/13/23 1251 01/14/23 0358  HGB 10.9*  --  9.3*  HCT 34.5*  --  29.7*  PLT 157  --  149*  LABPROT  --  27.2* 30.1*  INR  --  2.6* 2.9*  CREATININE 2.73*  --  2.40*     Estimated Creatinine Clearance: 12.9 mL/min (A) (by C-G formula based on SCr of 2.4 mg/dL (H)).   Medical History: Past Medical History:  Diagnosis Date   Aortic atherosclerosis 01/23/2018   Atrial fibrillation    Benign positional vertigo 10/06/2011   Carotid artery disease    Chemotherapy induced neutropenia 07/21/2019   Cholelithiasis 01/23/2018   Cholelithiasis 01/23/2018    Chronic anticoagulation    Colovesical fistula, ruled out 04/14/2022   Coronary artery disease    status post coronary artery bypass grafting times 07/10/2004   Diabetes mellitus    Hypercholesteremia    Hypertension    Mechanical heart valve present    H. aortic valve replacement at the time of bypass surgery October 2005   Moderate to severe pulmonary hypertension    Peripheral arterial disease    history of left common iliac artery PTA and stenting for a chronic total occlusion 08/26/01   S/P cholecystectomy 03/06/2018    Assessment: 82 YOF on warfarin for atrial fibrillation admitted for abdominal pain and AKI. Pharmacy consulted to continue warfarin during admission.  Patient reports some blood when wiping and dark stool; however, does take iron daily. No concerns for GI bleeding at this time.  PTA regimen 5mg  daily - last dose 4/5 PM INR 2.6> 2.9  Goal of Therapy:  INR goal 2.5-3.5 per anti-coag clinic note Monitor platelets by anticoagulation protocol: Yes   Plan:  Warfarin 5mg  PO once Daily INR/CBC  Tad Moore, PharmD Clinical Pharmacist 01/14/2023,8:01 AM

## 2023-01-14 NOTE — Progress Notes (Signed)
PROGRESS NOTE     [name], is a 82 y.o. female, DOB - [birth year], YJE:[id number]  Admit date - 01/13/2023   Admitting Physician Levie Heritage, DO  Outpatient Primary MD for the patient is Branch, [name], MD  LOS - 1  Chief Complaint  Patient presents with   Abdominal Pain        Brief Narrative:  82 y.o. female with medical history significant of atrial fibrillation, heart failure preserved EF, CAD, pulmonary hypertension, essential hypertension, diabetes secondary to kidney disease, ANCA vasculitis not currently on treatment admitted to 6 E. 2024 with AKI on CKD in the setting of persistent diarrhea    -Assessment and Plan: 1)AKI----acute kidney injury on CKD stage - 3B -Due to dehydration/GI losses -Patient does have a history of recurrent urinary retention from time to time -Baseline creatinine usually 1.5-1.7 -On admission on 01/13/2023 creatinine was up to 2.73 and BUN was up to 63 -Creatinine currently trending down -renally adjust medications, avoid nephrotoxic agents / dehydration  / hypotension  2)HypoNatremia----improving with hydration on admission sodium was 127 currently up to 1.33  3) chronic anemia--multifactorial including underlying CKD -Patient with recent GI bleed -Required transfusion back in March 2024 -Hgb currently above 9 -Watch closely for any bleeding as patient is on chronic anticoagulation with Coumadin  4)Diarrhea--recent antibiotic treatment for colitis,  -WBC 13.0 >> 11.6 -Stool for C. difficile and stool culture requested -Hold off on further antibiotics at this time  5)Chronic diastolic congestive heart failure, compensated -Concerns for dehydration due to GI losses -Hold Lasix   6)CAD s/p CABG/PAD---chest pain-free at this time -- Continue atorvastatin and metoprolol -- Hold aspirin for concern of GI bleed  -Patient is already on Coumadin for A-fib and mechanical aortic valve   7)Pulmonary hypertension -c/n Tadalafil      8)Anxiety/depression -- Zoloft 25 mg p.o. daily -- Xanax 0.5 mg p.o. nightly as needed anxiety   9)Type 2 diabetes mellitus Hemoglobin A1c 5.6 on  12/24/2022--reflecting excellent diabetic control PTA Use Novolog/Humalog Sliding scale insulin with Accu-Cheks/Fingersticks as ordered    10)GERD with superimposed Acute GI bleed -- Protonix 40 mg bid  -- Pepcid 20 mg bid  11)Paroxysmal atrial fibrillation/Mechanical AVR on Coumadin -Continue metoprolol for rate control -Pharmacy to help manage Coumadin therapy  12)Social/ethics--- DNR/DNI,  -Multiple comorbid conditions impacting overall prognosis  13)HTN--continue clonidine patch, amlodipine and hydralazine as well as metoprolol for BP control -IV labetalol as needed elevated BP   Status is: Inpatient   Disposition: The patient is from: Home              Anticipated d/c is to: Home              Anticipated d/c date is: 2 days              Patient currently is not medically stable to d/c. Barriers: Not Clinically Stable-   Code Status :  -  Code Status: DNR   Family Communication:    NA (patient is alert, awake and coherent)   DVT Prophylaxis  :   - SCDs / Coumadin   Lab Results  Component Value Date   PLT 149 (L) 01/14/2023    Inpatient Medications  Scheduled Meds:  allopurinol  100 mg Oral Daily   ALPRAZolam  0.5 mg Oral QHS   amLODipine  10 mg Oral Daily   atorvastatin  80 mg Oral q1800   cloNIDine  0.3 mg Transdermal Q Sat-1800   [START ON  01/15/2023] famotidine  20 mg Oral Daily   fentaNYL  1 patch Transdermal Q72H   hydrALAZINE  100 mg Oral TID   metoprolol succinate  25 mg Oral Daily   pantoprazole  40 mg Oral BID   sertraline  25 mg Oral Daily   tadalafil (PAH)  40 mg Oral Daily   Warfarin - Pharmacist Dosing Inpatient   Does not apply q1600   Continuous Infusions:  azithromycin 500 mg (01/14/23 1751)   PRN Meds:.loperamide, ondansetron   Anti-infectives (From admission, onward)    Start      Dose/Rate Route Frequency Ordered Stop   01/13/23 1800  azithromycin (ZITHROMAX) 500 mg in sodium chloride 0.9 % 250 mL IVPB        500 mg 250 mL/hr over 60 Minutes Intravenous Every 24 hours 01/13/23 1711           Subjective: [name] today has no fevers, no emesis,  No chest pain,   -    Objective: Vitals:   01/13/23 1747 01/13/23 2142 01/14/23 0423 01/14/23 1451  BP: (!) 162/60 (!) 157/60 (!) 162/56 (!) 153/69  Pulse: 99 84 (!) 105 87  Resp: (!) 24 20 20 18   Temp: 99.6 F (37.6 C) 99.3 F (37.4 C) 98.6 F (37 C) 99 F (37.2 C)  TempSrc: Oral  Oral Oral  SpO2: 98% 98% 97% 95%  Weight: 56 kg     Height: 4\' 10"  (1.473 m)       Intake/Output Summary (Last 24 hours) at 01/14/2023 1833 Last data filed at 01/14/2023 1700 Gross per 24 hour  Intake 960.87 ml  Output 750 ml  Net 210.87 ml   Filed Weights   01/13/23 1214 01/13/23 1747  Weight: 53.8 kg 56 kg   Physical Exam Gen:- Awake Alert,  in no apparent distress  HEENT:- [place].AT, No sclera icterus Neck-Supple Neck,No JVD,.  Lungs-  CTAB , fair symmetrical air movement CV- S1, S2 normal,, metallic click noted, irregularly irregular Abd-  +ve B.Sounds, Abd Soft, distended, somewhat uncomfortable, no rebound or guarding    Extremity/Skin:- No  edema, pedal pulses present  Psych-affect is appropriate, oriented x3 Neuro-no new focal deficits, no tremors  Data Reviewed: I have personally reviewed following labs and imaging studies  CBC: Recent Labs  Lab 01/13/23 1220 01/14/23 0358  WBC 13.0* 11.6*  NEUTROABS 10.7*  --   HGB 10.9* 9.3*  HCT 34.5* 29.7*  MCV 98.9 99.3  PLT 157 149*   Basic Metabolic Panel: Recent Labs  Lab 01/13/23 1220 01/14/23 0358  NA 127* 133*  K 3.8 3.5  CL 99 104  CO2 16* 19*  GLUCOSE 207* 100*  BUN 63* 61*  CREATININE 2.73* 2.40*  CALCIUM 8.7* 8.5*   GFR: Estimated Creatinine Clearance: 12.9 mL/min (A) (by C-G formula based on SCr of 2.4 mg/dL (H)). Liver Function  Tests: Recent Labs  Lab 01/13/23 1220  AST 26  ALT 11  ALKPHOS 109  BILITOT 0.7  PROT 6.4*  ALBUMIN 3.4*   Radiology Studies: CT ABDOMEN PELVIS WO CONTRAST  Result Date: 01/13/2023 CLINICAL DATA:  Abdominal pain, diarrhea EXAM: CT ABDOMEN AND PELVIS WITHOUT CONTRAST TECHNIQUE: Multidetector CT imaging of the abdomen and pelvis was performed following the standard protocol without IV contrast. RADIATION DOSE REDUCTION: This exam was performed according to the departmental dose-optimization program which includes automated exposure control, adjustment of the mA and/or kV according to patient size and/or use of iterative reconstruction technique. COMPARISON:  12/24/2022 FINDINGS: Lower chest:  Heart is enlarged in size. Coronary artery calcifications are seen. Dense calcification is seen in mitral annulus. Small to moderate bilateral pleural effusions are seen. There is peribronchial thickening. Small linear patchy infiltrates are seen in the lower lung fields. Hepatobiliary: Surgical clips are seen in gallbladder fossa. There is no significant dilation of bile ducts. Pancreas: Pancreas is smaller than usual in size. Spleen: There are calcified granulomas in spleen. Adrenals/Urinary Tract: Adrenals are unremarkable. There is no hydronephrosis. Extensive calcifications are seen in renal artery branches limiting evaluation for small nonobstructing renal stones. Ureters are not dilated. Urinary bladder is distended. There is no wall thickening in the bladder. Lower margin of the urinary bladder is below the level of pubic symphysis suggesting cystocele. Stomach/Bowel: Small hiatal hernia is seen. Stomach is not distended. There is no significant small bowel dilation. Appendix is not seen. There is no significant wall thickening in colon. Multiple diverticula are seen in the left colon without signs of focal diverticulitis. Vascular/Lymphatic: Extensive arterial calcifications are seen in aorta and its branches  including small peripheral branches. Reproductive: Uterus is not seen.  No adnexal masses are seen. Other: Small ascites is present.  There is no pneumoperitoneum. Musculoskeletal: Degenerative changes are noted in lumbar spine. Decrease in height of the body of L4 vertebra has not changed. Degenerative changes are noted in right hip. IMPRESSION: There is no evidence of intestinal obstruction or pneumoperitoneum. There is no hydronephrosis. Diverticulosis of colon without signs of focal diverticulitis. Minimal ascites. Cystocele. Small to moderate bilateral pleural effusions. Severe arteriosclerosis. Coronary artery disease. Lumbar spondylosis. Degenerative changes are noted in right hip. Electronically Signed   By: Ernie Avena M.D.   On: 01/13/2023 13:56    Scheduled Meds:  allopurinol  100 mg Oral Daily   ALPRAZolam  0.5 mg Oral QHS   amLODipine  10 mg Oral Daily   atorvastatin  80 mg Oral q1800   cloNIDine  0.3 mg Transdermal Q Sat-1800   [START ON 01/15/2023] famotidine  20 mg Oral Daily   fentaNYL  1 patch Transdermal Q72H   hydrALAZINE  100 mg Oral TID   metoprolol succinate  25 mg Oral Daily   pantoprazole  40 mg Oral BID   sertraline  25 mg Oral Daily   tadalafil (PAH)  40 mg Oral Daily   Warfarin - Pharmacist Dosing Inpatient   Does not apply q1600   Continuous Infusions:  azithromycin 500 mg (01/14/23 1751)    LOS: 1 day   Shon Hale M.D on 01/14/2023 at 6:33 PM  Go to [email or web address] - for contact info  [facility] - Office  [phone]  If 7PM-7AM, please contact night-coverage [email or web address] 01/14/2023, 6:33 PM

## 2023-01-14 NOTE — TOC Initial Note (Signed)
Transition of Care [facility]) - Initial/Assessment Note    Patient Details  Name: [name] MRN: [id number] Date of Birth: [birth year]  [facility] CM/SW Contact:    Elliot Gault, LCSW Phone Number: 01/14/2023, 12:21 PM  Clinical Narrative:                  Pt admitted from home. Pt known to TOC from previous admissions. Pt and her daughter reside together and plan is for return to home at dc.  Spoke with pt's son today to assess. Son states that pt remains active with [facility] for PT/OT/RN.   Family assists pt in getting to appointments and obtaining medications.   TOC will follow and assist with dc planning.  Expected Discharge Plan: Home w Home Health Services Barriers to Discharge: Continued Medical Work up   Patient Goals and CMS Choice Patient states their goals for this hospitalization and ongoing recovery are:: return home          Expected Discharge Plan and Services In-house Referral: Clinical Social Work   Post Acute Care Choice: Resumption of Svcs/PTA Provider Living arrangements for the past 2 months: Single Family Home                                      Prior Living Arrangements/Services Living arrangements for the past 2 months: Single Family Home Lives with:: Adult Children Patient language and need for interpreter reviewed:: Yes Do you feel safe going back to the place where you live?: Yes      Need for Family Participation in Patient Care: Yes (Comment) Care giver support system in place?: Yes (comment) Current home services: DME, Home PT, Home RN, Home OT Criminal Activity/Legal Involvement Pertinent to Current Situation/Hospitalization: No - Comment as needed  Activities of Daily Living Home Assistive Devices/Equipment: Walker (specify type) ADL Screening (condition at time of admission) Patient's cognitive ability adequate to safely complete daily activities?: Yes Is the patient deaf or have difficulty hearing?:  No Does the patient have difficulty seeing, even when wearing glasses/contacts?: No Does the patient have difficulty concentrating, remembering, or making decisions?: No Patient able to express need for assistance with ADLs?: Yes Does the patient have difficulty dressing or bathing?: No Independently performs ADLs?: Yes (appropriate for developmental age) Does the patient have difficulty walking or climbing stairs?: Yes Weakness of Legs: Both Weakness of Arms/Hands: None  Permission Sought/Granted                  Emotional Assessment       Orientation: : Oriented to Self, Oriented to Place, Oriented to  Time, Oriented to Situation Alcohol / Substance Use: Not Applicable Psych Involvement: Yes (comment)  Admission diagnosis:  Diarrhea of presumed infectious origin [R19.7] Dehydration [E86.0] AKI (acute kidney injury) [N17.9] Acute kidney injury superimposed on chronic kidney disease [N17.9, N18.9] Patient Active Problem List   Diagnosis Date Noted   Protein-calorie malnutrition, severe 12/26/2022   Acute GI bleeding 12/24/2022   Coagulopathy 12/24/2022   Melena 12/24/2022   Duodenitis 12/24/2022   Noninfectious gastroenteritis 12/24/2022   CAD, multiple vessel 12/24/2022   Acute on chronic heart failure with preserved ejection fraction (HFpEF) 08/29/2022   Acute exacerbation of CHF (congestive heart failure) 08/29/2022   Acute urinary retention 08/28/2022   Acute kidney injury superimposed on chronic kidney disease 08/27/2022   Constipation 08/27/2022   Nausea and vomiting  08/27/2022   Hypoalbuminemia due to protein-calorie malnutrition 08/27/2022   Type 2 diabetes mellitus with hyperglycemia 08/27/2022   Palliative care encounter    Goals of care, counseling/discussion    Encounter for therapeutic drug monitoring    GI bleed 07/09/2022   GI bleeding 07/08/2022   Acute on chronic anemia 07/08/2022   History of Clostridioides difficile colitis 07/08/2022   DNR (do  not resuscitate)/DNI(Do Not Intubate) 07/08/2022   Anemia of chronic disease 07/07/2022   Pressure injury of skin 06/23/2022   UTI (urinary tract infection) 06/22/2022   Joint pain 06/09/2022   Parietoalveolar pneumopathy 06/09/2022   Primary osteoarthritis 06/09/2022   Rheumatoid factor positive 06/09/2022   C. difficile colitis 04/19/2022   Colitis 04/18/2022   PAH (pulmonary artery hypertension) 04/18/2022   Emphysematous cystitis 04/13/2022   Female bladder prolapse 04/13/2022   Diverticulitis 04/12/2022   Autoimmune hemolytic anemia 01/02/2022   Bilateral lower extremity edema 01/02/2022   Refractory anemia 12/15/2021   Abnormal chest x-ray 12/15/2021   Supratherapeutic INR 12/15/2021   Elevated troponin 12/15/2021   Unilateral primary osteoarthritis, right hip 05/31/2021   Midline cystocele 05/30/2021   Primary localized osteoarthritis of pelvic region and thigh 05/30/2021   Vaginal pain 05/30/2021   Asymmetric SNHL (sensorineural hearing loss) 01/06/2021   Referred otalgia of both ears 01/06/2021   Bilateral hearing loss 08/31/2020   Low back pain 02/20/2020   CKD (chronic kidney disease), stage IV 10/13/2019   Leukopenia due to antineoplastic chemotherapy 08/18/2019   Thrombophilia 06/19/2019   Encounter for general adult medical examination without abnormal findings 06/13/2019   Arteritis 06/06/2019   Hypertensive heart and chronic kidney disease with heart failure and stage 1 through stage 4 chronic kidney disease, or unspecified chronic kidney disease 06/06/2019   Muscle weakness 06/06/2019   Pancytopenia 05/28/2019   Dyspnea 05/28/2019   Vasculitis, ANCA positive 05/28/2019   Disorder of connective tissue 05/14/2019   Renal insufficiency 05/08/2019   PAF (paroxysmal atrial fibrillation) 04/07/2019   Iron deficiency anemia 02/28/2019   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 02/28/2019   Diabetic retinopathy associated with type 2 diabetes  mellitus 09/02/2018   Chronic pain 06/19/2018   Non-thrombocytopenic purpura 05/21/2018   Gout 03/22/2018   Malnutrition of moderate degree 03/12/2018   Diabetes mellitus type 2, controlled 03/06/2018   Chronic atrial fibrillation 03/06/2018   H/O mechanical aortic valve replacement 03/06/2018   Moderate to severe pulmonary hypertension 03/06/2018   GERD without esophagitis 02/21/2018   Anxiety 02/21/2018   Chronic diastolic CHF (congestive heart failure) 02/21/2018   General weakness 02/02/2018   Thrombocytopenia 01/25/2018   Arm pain, diffuse 01/25/2018   Anemia 01/23/2018   Aortic atherosclerosis 01/23/2018   Age-related osteoporosis without current pathological fracture 05/24/2017   Carotid artery occlusion 05/24/2017   Generalized anxiety disorder 05/24/2017   Hearing loss 05/24/2017   Presence of prosthetic heart valve 05/24/2017   Carotid artery disease 08/18/2013   Peripheral arterial disease 08/18/2013   Diabetes mellitus without complication 08/18/2013   Essential hypertension    Hypercholesteremia    Mechanical heart valve present    Chronic anticoagulation    PCP:  Antoine PocheBranch, Jonathan F, MD Pharmacy:   [facility] [place], [place] - [address] [place] [id number] Phone: [phone] Fax: [phone]  [facility] - [place], [place] - [address] [place] [address] Phone: [phone] Fax: [phone]  [facility] -  [place], [place] - [address] [name] [address] Phone: [phone] Fax: [phone]     Social Determinants of Health (SDOH) Social History: SDOH Screenings   Food Insecurity: No Food Insecurity (12/25/2022)  Housing: Low Risk  (12/25/2022)  Transportation Needs: No Transportation Needs (12/25/2022)  Utilities: Not At Risk (12/25/2022)  Alcohol Screen: Low Risk  (02/13/2022)  Depression  (PHQ2-9): Low Risk  (02/13/2022)  Financial Resource Strain: Low Risk  (02/28/2022)  Physical Activity: Inactive (02/28/2022)  Social Connections: Moderately Integrated (06/22/2022)  Stress: No Stress Concern Present (02/28/2022)  Tobacco Use: Medium Risk (01/13/2023)   SDOH Interventions:     Readmission Risk Interventions    01/14/2023   12:20 PM 12/25/2022    9:36 AM 08/30/2022   11:42 AM  Readmission Risk Prevention Plan  Transportation Screening Complete Complete Complete  Medication Review Oceanographer) Complete Complete Complete  HRI or Home Care Consult Complete Complete Complete  SW Recovery Care/Counseling Consult Complete Complete Complete  Palliative Care Screening Not Applicable Not Applicable Not Applicable  Skilled Nursing Facility Not Applicable Not Applicable Not Applicable

## 2023-01-15 DIAGNOSIS — N179 Acute kidney failure, unspecified: Secondary | ICD-10-CM | POA: Diagnosis not present

## 2023-01-15 DIAGNOSIS — N189 Chronic kidney disease, unspecified: Secondary | ICD-10-CM | POA: Diagnosis not present

## 2023-01-15 LAB — GLUCOSE, CAPILLARY
Glucose-Capillary: 131 mg/dL — ABNORMAL HIGH (ref 70–99)
Glucose-Capillary: 138 mg/dL — ABNORMAL HIGH (ref 70–99)
Glucose-Capillary: 125 mg/dL — ABNORMAL HIGH (ref 70–99)
Glucose-Capillary: 131 mg/dL — ABNORMAL HIGH (ref 70–99)

## 2023-01-15 LAB — CBC
HCT: 35.1 % — ABNORMAL LOW (ref 36.0–46.0)
Hemoglobin: 10.5 g/dL — ABNORMAL LOW (ref 12.0–15.0)
MCH: 31.3 pg (ref 26.0–34.0)
MCHC: 29.9 g/dL — ABNORMAL LOW (ref 30.0–36.0)
MCV: 104.5 fL — ABNORMAL HIGH (ref 80.0–100.0)
Platelets: 161 10*3/uL (ref 150–400)
RBC: 3.36 MIL/uL — ABNORMAL LOW (ref 3.87–5.11)
RDW: 16.6 % — ABNORMAL HIGH (ref 11.5–15.5)
WBC: 9.6 10*3/uL (ref 4.0–10.5)
nRBC: 0 % (ref 0.0–0.2)

## 2023-01-15 LAB — RENAL FUNCTION PANEL
Albumin: 3.1 g/dL — ABNORMAL LOW (ref 3.5–5.0)
Anion gap: 11 (ref 5–15)
BUN: 59 mg/dL — ABNORMAL HIGH (ref 8–23)
CO2: 17 mmol/L — ABNORMAL LOW (ref 22–32)
Calcium: 8.5 mg/dL — ABNORMAL LOW (ref 8.9–10.3)
Chloride: 104 mmol/L (ref 98–111)
Creatinine, Ser: 2.31 mg/dL — ABNORMAL HIGH (ref 0.44–1.00)
GFR, Estimated: 20 mL/min — ABNORMAL LOW (ref >60)
Glucose, Bld: 132 mg/dL — ABNORMAL HIGH (ref 70–99)
Phosphorus: 4.8 mg/dL — ABNORMAL HIGH (ref 2.5–4.6)
Potassium: 4 mmol/L (ref 3.5–5.1)
Sodium: 132 mmol/L — ABNORMAL LOW (ref 135–145)

## 2023-01-15 LAB — PROTIME-INR
INR: 3.4 — ABNORMAL HIGH (ref 0.8–1.2)
Prothrombin Time: 34.3 seconds — ABNORMAL HIGH (ref 11.4–15.2)

## 2023-01-15 MED ORDER — GUAIFENESIN-DM 100-10 MG/5ML PO SYRP
5.0000 mL | ORAL_SOLUTION | ORAL | Status: DC | PRN
  Administered 2023-01-15 (×2): 5 mL via ORAL
  Filled 2023-01-15 (×2): qty 5

## 2023-01-15 MED ORDER — WARFARIN SODIUM 2.5 MG PO TABS
2.5000 mg | ORAL_TABLET | Freq: Once | ORAL | Status: AC
  Administered 2023-01-15: 2.5 mg via ORAL
  Filled 2023-01-15: qty 1

## 2023-01-15 NOTE — Progress Notes (Signed)
ANTICOAGULATION CONSULT NOTE  Pharmacy Consult for warfarin Indication: atrial fibrillation/ mechanical valve  Allergies  Allergen Reactions   Augmentin [Amoxicillin-Pot Clavulanate] Diarrhea and Nausea And Vomiting   Ciprofloxacin Diarrhea, Nausea Only and Other (See Comments)    Stomach ache   Codeine     Insomnia, anxiety   Coreg [Carvedilol] Other (See Comments)    Terrible cramping in the feet and had a lot of bowel movements, but not diarrhea   Diflucan [Fluconazole] Diarrhea   Levofloxacin In D5w     Vomiting, diarrhea, insomnia   Losartan Swelling and Other (See Comments)    Patient doesn't recall site of swelling   Privigen [Immune Globulin (Human)] Other (See Comments)    Severe/excruciating pain   Sulfamethoxazole-Trimethoprim Diarrhea, Nausea Only and Other (See Comments)    Stomach upset   Verapamil Hives   Zetia [Ezetimibe] Other (See Comments)    Reaction not recalled   Zocor [Simvastatin - High Dose] Other (See Comments)    Reaction not recalled   Gatifloxacin Rash and Other (See Comments)    Redness to skin around eye    Patient Measurements: Height: 4\' 10"  (147.3 cm) Weight: 56 kg (123 lb 7.3 oz) IBW/kg (Calculated) : 40.9  Vital Signs: Temp: 98.8 F (37.1 C) (04/08 0354) BP: 148/71 (04/08 0354) Pulse Rate: 88 (04/08 0354)  Labs: Recent Labs    01/13/23 1220 01/13/23 1251 01/14/23 0358 01/15/23 0440 01/15/23 0839  HGB 10.9*  --  9.3* 10.5*  --   HCT 34.5*  --  29.7* 35.1*  --   PLT 157  --  149* 161  --   LABPROT  --  27.2* 30.1*  --  34.3*  INR  --  2.6* 2.9*  --  3.4*  CREATININE 2.73*  --  2.40* 2.31*  --      Estimated Creatinine Clearance: 13.4 mL/min (A) (by C-G formula based on SCr of 2.31 mg/dL (H)).   Medical History: Past Medical History:  Diagnosis Date   Aortic atherosclerosis 01/23/2018   Atrial fibrillation    Benign positional vertigo 10/06/2011   Carotid artery disease    Chemotherapy induced neutropenia  07/21/2019   Cholelithiasis 01/23/2018   Cholelithiasis 01/23/2018   Chronic anticoagulation    Colovesical fistula, ruled out 04/14/2022   Coronary artery disease    status post coronary artery bypass grafting times 07/10/2004   Diabetes mellitus    Hypercholesteremia    Hypertension    Mechanical heart valve present    H. aortic valve replacement at the time of bypass surgery October 2005   Moderate to severe pulmonary hypertension    Peripheral arterial disease    history of left common iliac artery PTA and stenting for a chronic total occlusion 08/26/01   S/P cholecystectomy 03/06/2018    Assessment: 82 YOF on warfarin for atrial fibrillation admitted for abdominal pain and AKI. Pharmacy consulted to continue warfarin during admission.  Patient reports some blood when wiping and dark stool; however, does take iron daily. No concerns for GI bleeding at this time.  PTA regimen 5mg  daily - last dose 4/5 PM INR 2.6> 2.9>3.4  Goal of Therapy:  INR goal 2.5-3.5 per anti-coag clinic note Monitor platelets by anticoagulation protocol: Yes   Plan:  Warfarin 2.5 mg PO once Daily INR/CBC  Tad Moore, PharmD Clinical Pharmacist 01/15/2023,9:01 AM

## 2023-01-15 NOTE — Progress Notes (Deleted)
Mobility Specialist Progress Note:    01/15/23 1203  Mobility  Activity Ambulated with assistance in room  Level of Assistance Contact guard assist, steadying assist  Assistive Device Front wheel walker  Distance Ambulated (ft) 25 ft  Activity Response Tolerated well  Mobility Referral Yes  $Mobility charge 1 Mobility   Pt agreeable to mobility session. Ambulated in room with RW, CGA required for safety.Tolerated well. Pt incontinent of stool, assisted to bathroom. Nurse arrived to room to assist with peri care. Left pt in care of nurses for dialysis, all needs met.   Feliciana Rossetti Mobility Specialist Please contact via Special educational needs teacher or  Rehab office at [phone]

## 2023-01-15 NOTE — Progress Notes (Signed)
PROGRESS NOTE     [name], is a 82 y.o. female, DOB - 1941/01/14, ZOX:[id number]  Admit date - 01/13/2023   Admitting Physician Levie HeritageJacob J Stinson, DO  Outpatient Primary MD for the patient is Branch, Dorothe PeaJonathan F, MD  LOS - 2  Chief Complaint  Patient presents with   Abdominal Pain        Brief Narrative:  82 y.o. female with medical history significant of atrial fibrillation, heart failure preserved EF, CAD, pulmonary hypertension, essential hypertension, diabetes secondary to kidney disease, ANCA vasculitis not currently on treatment admitted to 6 E. 2024 with AKI on CKD in the setting of persistent diarrhea    -Assessment and Plan: 1)AKI----acute kidney injury on CKD stage - 3B -Due to dehydration/GI losses -Patient does have a history of recurrent urinary retention from time to time -Baseline creatinine usually 1.5-1.7 -On admission on 01/13/2023 creatinine was up to 2.73 and BUN was up to 63 -Creatinine currently trending down -renally adjust medications, avoid nephrotoxic agents / dehydration  / hypotension  2)HypoNatremia----improving with hydration on admission sodium was 127 currently up to 1.33  3) chronic anemia--multifactorial including underlying CKD -Patient with recent GI bleed -Required transfusion back in March 2024 -Hgb currently above 9 -Watch closely for any bleeding as patient is on chronic anticoagulation with Coumadin  4)Diarrhea--recent antibiotic treatment for colitis,  -WBC 13.0 >> 11.6 -Stool for C. difficile and stool culture requested -Hold off on further antibiotics at this time  5)Chronic diastolic congestive heart failure, compensated -Concerns for dehydration due to GI losses -Hold Lasix   6)CAD s/p CABG/PAD---chest pain-free at this time -- Continue atorvastatin and metoprolol -- Hold aspirin for concern of GI bleed  -Patient is already on Coumadin for A-fib and mechanical aortic valve   7)Pulmonary hypertension -c/n Tadalafil      8)Anxiety/depression -- Zoloft 25 mg p.o. daily -- Xanax 0.5 mg p.o. nightly as needed anxiety   9)Type 2 diabetes mellitus Hemoglobin A1c 5.6 on  12/24/2022--reflecting excellent diabetic control PTA Use Novolog/Humalog Sliding scale insulin with Accu-Cheks/Fingersticks as ordered    10)GERD with superimposed Acute GI bleed -- Protonix 40 mg bid  -- Pepcid 20 mg bid  11)Paroxysmal atrial fibrillation/Mechanical AVR on Coumadin -Continue metoprolol for rate control -Pharmacy to help manage Coumadin therapy  12)Social/ethics--- DNR/DNI,  -Multiple comorbid conditions impacting overall prognosis  13)HTN--continue clonidine patch, amlodipine and hydralazine as well as metoprolol for BP control -IV labetalol as needed elevated BP   Status is: Inpatient   Disposition: The patient is from: Home              Anticipated d/c is to: Home              Anticipated d/c date is: 2 days              Patient currently is not medically stable to d/c. Barriers: Not Clinically Stable-   Code Status :  -  Code Status: DNR   Family Communication:    NA (patient is alert, awake and coherent)   DVT Prophylaxis  :   - SCDs / Coumadin   Lab Results  Component Value Date   PLT 161 01/15/2023    Inpatient Medications  Scheduled Meds:  allopurinol  100 mg Oral Daily   ALPRAZolam  0.5 mg Oral QHS   amLODipine  10 mg Oral Daily   atorvastatin  80 mg Oral q1800   cloNIDine  0.3 mg Transdermal Q Sat-1800   famotidine  20  mg Oral Daily   fentaNYL  1 patch Transdermal Q72H   hydrALAZINE  100 mg Oral TID   insulin aspart  0-5 Units Subcutaneous QHS   insulin aspart  0-9 Units Subcutaneous TID WC   metoprolol succinate  25 mg Oral Daily   pantoprazole  40 mg Oral BID   sertraline  25 mg Oral Daily   tadalafil (PAH)  40 mg Oral Daily   Warfarin - Pharmacist Dosing Inpatient   Does not apply q1600   Continuous Infusions:   PRN Meds:.guaiFENesin-dextromethorphan, labetalol, loperamide,  ondansetron   Anti-infectives (From admission, onward)    Start     Dose/Rate Route Frequency Ordered Stop   01/13/23 1800  azithromycin (ZITHROMAX) 500 mg in sodium chloride 0.9 % 250 mL IVPB        500 mg 250 mL/hr over 60 Minutes Intravenous Every 24 hours 01/13/23 1711 01/14/23 1851         Subjective: [name] Dad today has no fevers, no emesis,  No chest pain,   -    Objective: Vitals:   01/14/23 1451 01/14/23 2123 01/15/23 0354 01/15/23 1315  BP: (!) 153/69 (!) 141/55 (!) 148/71 (!) 145/86  Pulse: 87 89 88 79  Resp: 18 16 20    Temp: 99 F (37.2 C) 98.6 F (37 C) 98.8 F (37.1 C) 98.4 F (36.9 C)  TempSrc: Oral   Oral  SpO2: 95% 97% 95% 94%  Weight:      Height:        Intake/Output Summary (Last 24 hours) at 01/15/2023 1803 Last data filed at 01/15/2023 1300 Gross per 24 hour  Intake 720 ml  Output 100 ml  Net 620 ml   Filed Weights   01/13/23 1214 01/13/23 1747  Weight: 53.8 kg 56 kg   Physical Exam Gen:- Awake Alert,  in no apparent distress  HEENT:- [place].AT, No sclera icterus Neck-Supple Neck,No JVD,.  Lungs-  CTAB , fair symmetrical air movement CV- S1, S2 normal,, metallic click noted, irregularly irregular Abd-  +ve B.Sounds, Abd Soft, distended, somewhat uncomfortable, no rebound or guarding    Extremity/Skin:- No  edema, pedal pulses present  Psych-affect is appropriate, oriented x3 Neuro-no new focal deficits, no tremors  Data Reviewed: I have personally reviewed following labs and imaging studies  CBC: Recent Labs  Lab 01/13/23 1220 01/14/23 0358 01/15/23 0440  WBC 13.0* 11.6* 9.6  NEUTROABS 10.7*  --   --   HGB 10.9* 9.3* 10.5*  HCT 34.5* 29.7* 35.1*  MCV 98.9 99.3 104.5*  PLT 157 149* 161   Basic Metabolic Panel: Recent Labs  Lab 01/13/23 1220 01/14/23 0358 01/15/23 0440  NA 127* 133* 132*  K 3.8 3.5 4.0  CL 99 104 104  CO2 16* 19* 17*  GLUCOSE 207* 100* 132*  BUN 63* 61* 59*  CREATININE 2.73* 2.40* 2.31*  CALCIUM  8.7* 8.5* 8.5*  PHOS  --   --  4.8*   GFR: Estimated Creatinine Clearance: 13.4 mL/min (A) (by C-G formula based on SCr of 2.31 mg/dL (H)). Liver Function Tests: Recent Labs  Lab 01/13/23 1220 01/15/23 0440  AST 26  --   ALT 11  --   ALKPHOS 109  --   BILITOT 0.7  --   PROT 6.4*  --   ALBUMIN 3.4* 3.1*   Radiology Studies: No results found.  Scheduled Meds:  allopurinol  100 mg Oral Daily   ALPRAZolam  0.5 mg Oral QHS   amLODipine  10 mg Oral  Daily   atorvastatin  80 mg Oral q1800   cloNIDine  0.3 mg Transdermal Q Sat-1800   famotidine  20 mg Oral Daily   fentaNYL  1 patch Transdermal Q72H   hydrALAZINE  100 mg Oral TID   insulin aspart  0-5 Units Subcutaneous QHS   insulin aspart  0-9 Units Subcutaneous TID WC   metoprolol succinate  25 mg Oral Daily   pantoprazole  40 mg Oral BID   sertraline  25 mg Oral Daily   tadalafil (PAH)  40 mg Oral Daily   Warfarin - Pharmacist Dosing Inpatient   Does not apply q1600   Continuous Infusions:    LOS: 2 days   Shon Hale M.D on 01/15/2023 at 6:03 PM  Go to [email or web address] - for contact info  [facility] - Office  [phone]  If 7PM-7AM, please contact night-coverage [email or web address] 01/15/2023, 6:03 PM

## 2023-01-16 ENCOUNTER — Inpatient Hospital Stay (HOSPITAL_COMMUNITY): Payer: Medicare HMO

## 2023-01-16 DIAGNOSIS — N179 Acute kidney failure, unspecified: Secondary | ICD-10-CM | POA: Diagnosis not present

## 2023-01-16 DIAGNOSIS — N189 Chronic kidney disease, unspecified: Secondary | ICD-10-CM | POA: Diagnosis not present

## 2023-01-16 LAB — CBC
HCT: 30.8 % — ABNORMAL LOW (ref 36.0–46.0)
Hemoglobin: 9.4 g/dL — ABNORMAL LOW (ref 12.0–15.0)
MCH: 30.5 pg (ref 26.0–34.0)
MCHC: 30.5 g/dL (ref 30.0–36.0)
MCV: 100 fL (ref 80.0–100.0)
Platelets: 150 10*3/uL (ref 150–400)
RBC: 3.08 MIL/uL — ABNORMAL LOW (ref 3.87–5.11)
RDW: 15.9 % — ABNORMAL HIGH (ref 11.5–15.5)
WBC: 5.2 10*3/uL (ref 4.0–10.5)
nRBC: 0 % (ref 0.0–0.2)

## 2023-01-16 LAB — BASIC METABOLIC PANEL
Anion gap: 10 (ref 5–15)
BUN: 59 mg/dL — ABNORMAL HIGH (ref 8–23)
CO2: 18 mmol/L — ABNORMAL LOW (ref 22–32)
Calcium: 8.1 mg/dL — ABNORMAL LOW (ref 8.9–10.3)
Chloride: 103 mmol/L (ref 98–111)
Creatinine, Ser: 2.35 mg/dL — ABNORMAL HIGH (ref 0.44–1.00)
GFR, Estimated: 20 mL/min — ABNORMAL LOW (ref >60)
Glucose, Bld: 154 mg/dL — ABNORMAL HIGH (ref 70–99)
Potassium: 4.1 mmol/L (ref 3.5–5.1)
Sodium: 131 mmol/L — ABNORMAL LOW (ref 135–145)

## 2023-01-16 LAB — PROTIME-INR
INR: 3.9 — ABNORMAL HIGH (ref 0.8–1.2)
Prothrombin Time: 38.1 seconds — ABNORMAL HIGH (ref 11.4–15.2)

## 2023-01-16 LAB — GLUCOSE, CAPILLARY
Glucose-Capillary: 135 mg/dL — ABNORMAL HIGH (ref 70–99)
Glucose-Capillary: 157 mg/dL — ABNORMAL HIGH (ref 70–99)
Glucose-Capillary: 118 mg/dL — ABNORMAL HIGH (ref 70–99)
Glucose-Capillary: 152 mg/dL — ABNORMAL HIGH (ref 70–99)

## 2023-01-16 MED ORDER — IPRATROPIUM-ALBUTEROL 0.5-2.5 (3) MG/3ML IN SOLN
3.0000 mL | Freq: Three times a day (TID) | RESPIRATORY_TRACT | Status: DC
  Administered 2023-01-16 (×2): 3 mL via RESPIRATORY_TRACT
  Filled 2023-01-16 (×2): qty 3

## 2023-01-16 MED ORDER — IPRATROPIUM-ALBUTEROL 0.5-2.5 (3) MG/3ML IN SOLN
3.0000 mL | Freq: Four times a day (QID) | RESPIRATORY_TRACT | Status: DC | PRN
  Filled 2023-01-16: qty 3

## 2023-01-16 MED ORDER — ALBUTEROL SULFATE (2.5 MG/3ML) 0.083% IN NEBU
2.5000 mg | INHALATION_SOLUTION | RESPIRATORY_TRACT | Status: DC | PRN
  Administered 2023-01-18 – 2023-01-24 (×3): 2.5 mg via RESPIRATORY_TRACT
  Filled 2023-01-16 (×2): qty 3

## 2023-01-16 MED ORDER — SODIUM CHLORIDE 0.9 % IV SOLN
1.0000 g | INTRAVENOUS | Status: DC
  Administered 2023-01-16 – 2023-01-19 (×4): 1 g via INTRAVENOUS
  Filled 2023-01-16 (×4): qty 10

## 2023-01-16 MED ORDER — DOXYCYCLINE HYCLATE 100 MG PO TABS
100.0000 mg | ORAL_TABLET | Freq: Two times a day (BID) | ORAL | Status: DC
  Administered 2023-01-16 – 2023-01-24 (×18): 100 mg via ORAL
  Filled 2023-01-16 (×19): qty 1

## 2023-01-16 MED ORDER — IPRATROPIUM-ALBUTEROL 0.5-2.5 (3) MG/3ML IN SOLN: 3.0000 mL | Freq: Two times a day (BID) | RESPIRATORY_TRACT | Status: DC

## 2023-01-16 NOTE — Progress Notes (Signed)
Mobility Specialist Progress Note:    01/16/23 1403  Mobility  Activity Ambulated with assistance in room  Level of Assistance Standby assist, set-up cues, supervision of patient - no hands on  Assistive Device Front wheel walker  Distance Ambulated (ft) 75 ft  Activity Response Tolerated well  Mobility Referral Yes  $Mobility charge 1 Mobility   Pt agreeable to mobility session. Tolerated well, wheezing throughout, SpO2 89% on 1L during ambulation. Returned pt to EOB and repositioned to supine. Left pt in care of family, all needs met, call bell in reach.  Feliciana Rossetti Mobility Specialist Please contact via Special educational needs teacher or  Rehab office at [phone]

## 2023-01-16 NOTE — Progress Notes (Addendum)
Mobility Specialist Progress Note:    01/16/23 1000  Mobility  Activity Ambulated with assistance in room  Level of Assistance Minimal assist, patient does 75% or more  Assistive Device Front wheel walker  Distance Ambulated (ft) 25 ft  Activity Response Tolerated well  Mobility Referral Yes  $Mobility charge 1 Mobility   Pt agreeable to mobility session. Required MinA to stand, SB during ambulation. Tolerated well, pt had coughing spells and SOB throughout, SpO2 92% on 2L during ambulation. Returned pt to bed, all needs met, call bell in reach, bed alarm on.   Feliciana Rossetti Mobility Specialist Please contact via Special educational needs teacher or  Rehab office at [phone]

## 2023-01-16 NOTE — Plan of Care (Signed)

## 2023-01-16 NOTE — Progress Notes (Signed)
PROGRESS NOTE  [name], is a 82 y.o. female, DOB - 1941/06/04, [id number]  Admit date - 01/13/2023   Admitting Physician No admitting provider for patient encounter.  Outpatient Primary MD for the patient is Branch, [name], MD  LOS - 3  Chief Complaint  Patient presents with   Abdominal Pain      Brief Narrative:  82 y.o. female with medical history significant of atrial fibrillation, heart failure preserved EF, CAD, pulmonary hypertension, essential hypertension, diabetes secondary to kidney disease, ANCA vasculitis not currently on treatment admitted on 01/13/2023 with AKI on CKD in the setting of persistent diarrhea    -Assessment and Plan: 1)AKI----acute kidney injury on CKD stage - 3B -Due to dehydration/GI losses -Patient does have a history of recurrent urinary retention from time to time -Baseline creatinine usually 1.5-1.7 -On admission on 01/13/2023 creatinine was up to 2.73 and BUN was up to 63 -Creatinine currently trending down---Not back to baseline yet -renally adjust medications, avoid nephrotoxic agents / dehydration  / hypotension  2) acute hypoxic respiratory failure--new on 01/16/2023 -On 01/16/2023 patient developed respiratory distress with hypoxia requiring oxygen chest x-ray suggesting left-sided pneumonia -Start Rocephin and doxycycline as prescribed -Continue mucolytics and bronchodilators as ordered -Try to wean off oxygen as able  3) chronic anemia--multifactorial including underlying CKD -Patient with recent GI bleed -Required transfusion back in March 2024 -Hgb currently above 9 -Watch closely for any bleeding as patient is on chronic anticoagulation with Coumadin  4)Diarrhea--recent antibiotic treatment for colitis,  -WBC 13.0 >> 11.6>>9.6> 5.2 -Stool for C. difficile and stool culture requested, but was not sent-patient's diarrhea appears to have resolved -Hold off on further antibiotics at this time  5)Chronic diastolic congestive heart  failure, compensated -Concerns for dehydration due to GI losses -Hold Lasix   6)CAD s/p CABG/PAD---chest pain-free at this time -- Continue atorvastatin and metoprolol -- Hold aspirin for concern of GI bleed  -Patient is already on Coumadin for A-fib and mechanical aortic valve   7)Pulmonary hypertension -c/n Tadalafil     8)Anxiety/depression -- Zoloft 25 mg p.o. daily -- Xanax 0.5 mg p.o. nightly as needed anxiety   9)Type 2 diabetes mellitus Hemoglobin A1c 5.6 on  12/24/2022--reflecting excellent diabetic control PTA Use Novolog/Humalog Sliding scale insulin with Accu-Cheks/Fingersticks as ordered    10)GERD with superimposed Acute GI bleed -- Protonix 40 mg bid  -- Pepcid 20 mg bid  11)Paroxysmal atrial fibrillation/Mechanical AVR on Coumadin -Continue metoprolol for rate control -Pharmacy to help manage Coumadin therapy  12)Social/ethics--- DNR/DNI,  -Multiple comorbid conditions impacting overall prognosis -ReCurrent hospitalizations Previously seen by palliative care team  13)HTN--continue clonidine patch, amlodipine and hydralazine as well as metoprolol for BP control -IV labetalol as needed elevated BP  14)HypoNatremia--- on admission sodium was 127  -Sodium is improving with hydration  15) chronic pain syndrome--continue Duragesic patch   Status is: Inpatient   Disposition: The patient is from: Home              Anticipated d/c is to: Home              Anticipated d/c date is: 2 days              Patient currently is not medically stable to d/c. Barriers: Not Clinically Stable-   Code Status :  -  Code Status: DNR   Family Communication:    NA (patient is alert, awake and coherent)   DVT Prophylaxis  :   - SCDs / Coumadin  Lab Results  Component Value Date   PLT 150 01/16/2023    Inpatient Medications  Scheduled Meds:  allopurinol  100 mg Oral Daily   ALPRAZolam  0.5 mg Oral QHS   amLODipine  10 mg Oral Daily   atorvastatin  80 mg Oral  q1800   cloNIDine  0.3 mg Transdermal Q Sat-1800   doxycycline  100 mg Oral Q12H   famotidine  20 mg Oral Daily   fentaNYL  1 patch Transdermal Q72H   hydrALAZINE  100 mg Oral TID   insulin aspart  0-5 Units Subcutaneous QHS   insulin aspart  0-9 Units Subcutaneous TID WC   ipratropium-albuterol  3 mL Nebulization TID   metoprolol succinate  25 mg Oral Daily   pantoprazole  40 mg Oral BID   sertraline  25 mg Oral Daily   tadalafil (PAH)  40 mg Oral Daily   Warfarin - Pharmacist Dosing Inpatient   Does not apply q1600   Continuous Infusions:  cefTRIAXone (ROCEPHIN)  IV 1 g (01/16/23 1213)    PRN Meds:.albuterol, guaiFENesin-dextromethorphan, labetalol, loperamide, ondansetron   Anti-infectives (From admission, onward)    Start     Dose/Rate Route Frequency Ordered Stop   01/16/23 1115  cefTRIAXone (ROCEPHIN) 1 g in sodium chloride 0.9 % 100 mL IVPB        1 g 200 mL/hr over 30 Minutes Intravenous Every 24 hours 01/16/23 1027     01/16/23 1115  doxycycline (VIBRA-TABS) tablet 100 mg        100 mg Oral Every 12 hours 01/16/23 1027     01/13/23 1800  azithromycin (ZITHROMAX) 500 mg in sodium chloride 0.9 % 250 mL IVPB        500 mg 250 mL/hr over 60 Minutes Intravenous Every 24 hours 01/13/23 1711 01/14/23 1851         Subjective: [name] today has no fevers, no emesis,  No chest pain,   - -On 01/16/2023 patient developed respiratory distress with hypoxia requiring oxygen chest x-ray suggesting left-sided pneumonia -Reports worsening dyspnea on exertion and cough -Improving with bronchodilators and supplemental oxygen  Objective: Vitals:   01/15/23 1315 01/16/23 0506 01/16/23 1413 01/16/23 1425  BP: (!) 145/86 (!) 140/83  131/68  Pulse: 79 88 81 69  Resp:  18 20 20   Temp: 98.4 F (36.9 C) 98.2 F (36.8 C)  99.3 F (37.4 C)  TempSrc: Oral   Oral  SpO2: 94% 95% 94% 97%  Weight:      Height:        Intake/Output Summary (Last 24 hours) at 01/16/2023 1550 Last  data filed at 01/16/2023 0900 Gross per 24 hour  Intake 960 ml  Output 600 ml  Net 360 ml   Filed Weights   01/13/23 1214 01/13/23 1747  Weight: 53.8 kg 56 kg   Physical Exam Gen:- Awake Alert,  in no apparent distress  HEENT:- [place].AT, No sclera icterus NOSE- [place] 2L/min Neck-Supple Neck,No JVD,.  Lungs-diminished breath sounds, no wheezing  CV- S1, S2 normal,, metallic click noted, irregularly irregular Abd-  +ve B.Sounds, Abd Soft, distended, somewhat uncomfortable, no rebound or guarding    Extremity/Skin:- No  edema, pedal pulses present  Psych-affect is appropriate, oriented x3 Neuro-generalized weakness, no new focal deficits, no tremors  Data Reviewed: I have personally reviewed following labs and imaging studies  CBC: Recent Labs  Lab 01/13/23 1220 01/14/23 0358 01/15/23 0440 01/16/23 0850  WBC 13.0* 11.6* 9.6 5.2  NEUTROABS 10.7*  --   --   --  HGB 10.9* 9.3* 10.5* 9.4*  HCT 34.5* 29.7* 35.1* 30.8*  MCV 98.9 99.3 104.5* 100.0  PLT 157 149* 161 150   Basic Metabolic Panel: Recent Labs  Lab 01/13/23 1220 01/14/23 0358 01/15/23 0440 01/16/23 0850  NA 127* 133* 132* 131*  K 3.8 3.5 4.0 4.1  CL 99 104 104 103  CO2 16* 19* 17* 18*  GLUCOSE 207* 100* 132* 154*  BUN 63* 61* 59* 59*  CREATININE 2.73* 2.40* 2.31* 2.35*  CALCIUM 8.7* 8.5* 8.5* 8.1*  PHOS  --   --  4.8*  --    GFR: Estimated Creatinine Clearance: 13.2 mL/min (A) (by C-G formula based on SCr of 2.35 mg/dL (H)). Liver Function Tests: Recent Labs  Lab 01/13/23 1220 01/15/23 0440  AST 26  --   ALT 11  --   ALKPHOS 109  --   BILITOT 0.7  --   PROT 6.4*  --   ALBUMIN 3.4* 3.1*   Radiology Studies: DG CHEST PORT 1 VIEW  Result Date: 01/16/2023 CLINICAL DATA:  Dyspnea and respiratory abnormalities. EXAM: PORTABLE CHEST 1 VIEW COMPARISON:  12/24/2022 FINDINGS: The cardio pericardial silhouette is enlarged. Interstitial markings are diffusely coarsened with chronic features. Interval development  of retrocardiac left base collapse/consolidation with small left pleural effusion. Calcified granuloma again noted right mid lung. Bones are diffusely demineralized. IMPRESSION: Interval development of retrocardiac left base collapse/consolidation with small left pleural effusion. Electronically Signed   By: Kennith Center M.D.   On: 01/16/2023 08:54    Scheduled Meds:  allopurinol  100 mg Oral Daily   ALPRAZolam  0.5 mg Oral QHS   amLODipine  10 mg Oral Daily   atorvastatin  80 mg Oral q1800   cloNIDine  0.3 mg Transdermal Q Sat-1800   doxycycline  100 mg Oral Q12H   famotidine  20 mg Oral Daily   fentaNYL  1 patch Transdermal Q72H   hydrALAZINE  100 mg Oral TID   insulin aspart  0-5 Units Subcutaneous QHS   insulin aspart  0-9 Units Subcutaneous TID WC   ipratropium-albuterol  3 mL Nebulization TID   metoprolol succinate  25 mg Oral Daily   pantoprazole  40 mg Oral BID   sertraline  25 mg Oral Daily   tadalafil (PAH)  40 mg Oral Daily   Warfarin - Pharmacist Dosing Inpatient   Does not apply q1600   Continuous Infusions:  cefTRIAXone (ROCEPHIN)  IV 1 g (01/16/23 1213)     LOS: 3 days   Shon Hale M.D on 01/16/2023 at 3:50 PM  Go to [email or web address] - for contact info  [facility] - Office  [phone]  If 7PM-7AM, please contact night-coverage [email or web address] 01/16/2023, 3:50 PM

## 2023-01-16 NOTE — Progress Notes (Signed)
ANTICOAGULATION CONSULT NOTE  Pharmacy Consult for warfarin Indication: atrial fibrillation/ mechanical valve  Allergies  Allergen Reactions   Augmentin [Amoxicillin-Pot Clavulanate] Diarrhea and Nausea And Vomiting   Ciprofloxacin Diarrhea, Nausea Only and Other (See Comments)    Stomach ache   Codeine     Insomnia, anxiety   Coreg [Carvedilol] Other (See Comments)    Terrible cramping in the feet and had a lot of bowel movements, but not diarrhea   Diflucan [Fluconazole] Diarrhea   Levofloxacin In D5w     Vomiting, diarrhea, insomnia   Losartan Swelling and Other (See Comments)    Patient doesn't recall site of swelling   Privigen [Immune Globulin (Human)] Other (See Comments)    Severe/excruciating pain   Sulfamethoxazole-Trimethoprim Diarrhea, Nausea Only and Other (See Comments)    Stomach upset   Verapamil Hives   Zetia [Ezetimibe] Other (See Comments)    Reaction not recalled   Zocor [Simvastatin - High Dose] Other (See Comments)    Reaction not recalled   Gatifloxacin Rash and Other (See Comments)    Redness to skin around eye    Patient Measurements: Height: 4\' 10"  (147.3 cm) Weight: 56 kg (123 lb 7.3 oz) IBW/kg (Calculated) : 40.9  Vital Signs: Temp: 98.2 F (36.8 C) (04/09 0506) BP: 140/83 (04/09 0506) Pulse Rate: 88 (04/09 0506)  Labs: Recent Labs    01/13/23 1220 01/13/23 1251 01/14/23 0358 01/15/23 0440 01/15/23 0839 01/16/23 0404  HGB 10.9*  --  9.3* 10.5*  --   --   HCT 34.5*  --  29.7* 35.1*  --   --   PLT 157  --  149* 161  --   --   LABPROT  --    < > 30.1*  --  34.3* 38.1*  INR  --    < > 2.9*  --  3.4* 3.9*  CREATININE 2.73*  --  2.40* 2.31*  --   --    < > = values in this interval not displayed.     Estimated Creatinine Clearance: 13.4 mL/min (A) (by C-G formula based on SCr of 2.31 mg/dL (H)).   Medical History: Past Medical History:  Diagnosis Date   Aortic atherosclerosis 01/23/2018   Atrial fibrillation    Benign  positional vertigo 10/06/2011   Carotid artery disease    Chemotherapy induced neutropenia 07/21/2019   Cholelithiasis 01/23/2018   Cholelithiasis 01/23/2018   Chronic anticoagulation    Colovesical fistula, ruled out 04/14/2022   Coronary artery disease    status post coronary artery bypass grafting times 07/10/2004   Diabetes mellitus    Hypercholesteremia    Hypertension    Mechanical heart valve present    H. aortic valve replacement at the time of bypass surgery October 2005   Moderate to severe pulmonary hypertension    Peripheral arterial disease    history of left common iliac artery PTA and stenting for a chronic total occlusion 08/26/01   S/P cholecystectomy 03/06/2018    Assessment: 82 YOF on warfarin for atrial fibrillation admitted for abdominal pain and AKI. Pharmacy consulted to continue warfarin during admission.  Patient reports some blood when wiping and dark stool; however, does take iron daily. No concerns for GI bleeding at this time.  PTA regimen 5mg  daily - last dose 4/5 PM INR 2.6> 2.9>3.4> 3.9  Goal of Therapy:  INR goal 2.5-3.5 per anti-coag clinic note Monitor platelets by anticoagulation protocol: Yes   Plan:  Hold warfarin x 1 dose. Daily INR/CBC  Tad Moore, PharmD Clinical Pharmacist 01/16/2023,8:39 AM

## 2023-01-17 DIAGNOSIS — I482 Chronic atrial fibrillation, unspecified: Secondary | ICD-10-CM

## 2023-01-17 DIAGNOSIS — N181 Chronic kidney disease, stage 1: Secondary | ICD-10-CM

## 2023-01-17 DIAGNOSIS — Z794 Long term (current) use of insulin: Secondary | ICD-10-CM

## 2023-01-17 DIAGNOSIS — E860 Dehydration: Secondary | ICD-10-CM

## 2023-01-17 DIAGNOSIS — N179 Acute kidney failure, unspecified: Secondary | ICD-10-CM | POA: Diagnosis not present

## 2023-01-17 DIAGNOSIS — K529 Noninfective gastroenteritis and colitis, unspecified: Secondary | ICD-10-CM

## 2023-01-17 DIAGNOSIS — I5032 Chronic diastolic (congestive) heart failure: Secondary | ICD-10-CM

## 2023-01-17 DIAGNOSIS — I10 Essential (primary) hypertension: Secondary | ICD-10-CM

## 2023-01-17 DIAGNOSIS — E1122 Type 2 diabetes mellitus with diabetic chronic kidney disease: Secondary | ICD-10-CM

## 2023-01-17 DIAGNOSIS — J189 Pneumonia, unspecified organism: Secondary | ICD-10-CM

## 2023-01-17 LAB — GLUCOSE, CAPILLARY
Glucose-Capillary: 132 mg/dL — ABNORMAL HIGH (ref 70–99)
Glucose-Capillary: 139 mg/dL — ABNORMAL HIGH (ref 70–99)
Glucose-Capillary: 105 mg/dL — ABNORMAL HIGH (ref 70–99)
Glucose-Capillary: 152 mg/dL — ABNORMAL HIGH (ref 70–99)

## 2023-01-17 LAB — RENAL FUNCTION PANEL
Albumin: 2.9 g/dL — ABNORMAL LOW (ref 3.5–5.0)
Anion gap: 8 (ref 5–15)
BUN: 58 mg/dL — ABNORMAL HIGH (ref 8–23)
CO2: 20 mmol/L — ABNORMAL LOW (ref 22–32)
Calcium: 8.2 mg/dL — ABNORMAL LOW (ref 8.9–10.3)
Chloride: 105 mmol/L (ref 98–111)
Creatinine, Ser: 2.32 mg/dL — ABNORMAL HIGH (ref 0.44–1.00)
GFR, Estimated: 20 mL/min — ABNORMAL LOW (ref >60)
Glucose, Bld: 144 mg/dL — ABNORMAL HIGH (ref 70–99)
Phosphorus: 3.8 mg/dL (ref 2.5–4.6)
Potassium: 4.3 mmol/L (ref 3.5–5.1)
Sodium: 133 mmol/L — ABNORMAL LOW (ref 135–145)

## 2023-01-17 LAB — PROTIME-INR
INR: 3.5 — ABNORMAL HIGH (ref 0.8–1.2)
Prothrombin Time: 35 seconds — ABNORMAL HIGH (ref 11.4–15.2)

## 2023-01-17 MED ORDER — DM-GUAIFENESIN ER 30-600 MG PO TB12
1.0000 | ORAL_TABLET | Freq: Two times a day (BID) | ORAL | Status: DC
  Administered 2023-01-17 – 2023-01-26 (×18): 1 via ORAL
  Filled 2023-01-17 (×18): qty 1

## 2023-01-17 MED ORDER — WARFARIN SODIUM 2.5 MG PO TABS
2.5000 mg | ORAL_TABLET | Freq: Once | ORAL | Status: AC
  Administered 2023-01-17: 2.5 mg via ORAL
  Filled 2023-01-17: qty 1

## 2023-01-17 MED ORDER — IPRATROPIUM-ALBUTEROL 0.5-2.5 (3) MG/3ML IN SOLN
3.0000 mL | Freq: Four times a day (QID) | RESPIRATORY_TRACT | Status: DC | PRN
  Administered 2023-01-18 – 2023-01-23 (×12): 3 mL via RESPIRATORY_TRACT
  Filled 2023-01-17 (×11): qty 3

## 2023-01-17 MED ORDER — ACETAMINOPHEN 325 MG PO TABS
650.0000 mg | ORAL_TABLET | Freq: Four times a day (QID) | ORAL | Status: DC | PRN
  Administered 2023-01-19 – 2023-01-21 (×2): 650 mg via ORAL
  Filled 2023-01-17 (×2): qty 2

## 2023-01-17 NOTE — Progress Notes (Signed)
PROGRESS NOTE  [name], is a 82 y.o. female, DOB - Aug 02, 1941, [id number]  Admit date - 01/13/2023   Admitting Physician No admitting provider for patient encounter.  Outpatient Primary MD for the patient is [name], [name] Pea, MD  LOS - 4  Chief Complaint  Patient presents with   Abdominal Pain      Brief Narrative:  82 y.o. 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 -Patient chronically on Lasix. -Baseline creatinine usually 1.5-1.7 -On admission on 01/13/2023 creatinine was up to 2.73 and BUN was up to 63 -Creatinine currently trending down close to baseline. -Continue to renally adjust medications, avoid nephrotoxic agents / dehydration  / hypotension  2) acute hypoxic respiratory failure--new on 01/16/2023 -On 01/16/2023 patient developed respiratory distress with hypoxia requiring oxygen chest x-ray suggesting left-sided pneumonia -Continue treatment with Rocephin and doxycycline as prescribed -Continue mucolytics and PRN bronchodilators  -Continue to wean off oxygen supplementation as tolerated; checking desaturation screening.  3) chronic anemia--multifactorial including underlying CKD -Patient with recent GI bleed -Patient required transfusion back in March 2024 -Hgb currently above 9 -Watch closely for any bleeding as patient is on chronic anticoagulation with Coumadin. -No overt bleeding appreciated-continue to follow hemoglobin trend.  4)Diarrhea--recent antibiotic treatment for colitis,  -WBC 13.0 >> 11.6>>9.6> 5.2 -Stool for C. difficile and stool culture requested, but was not  sent-patient's diarrhea appears to have resolved -Hold off on further antibiotic therapy for C. difficile. -If needed will add the use of Florastor. -Continue to maintain adequate hydration.  5)Chronic diastolic congestive heart failure, compensated -Concerns for dehydration due to GI losses -Continue to hold Lasix -Continue closely following volume status and daily weight.   6)CAD s/p CABG/PAD---chest pain-free at this time -- Continue atorvastatin and metoprolol -- Hold aspirin for concern of GI bleed  -Patient is already on Coumadin for A-fib and mechanical aortic valve   7)Pulmonary hypertension -c/n Tadalafil and outpatient follow-up with pulmonologist/cardiologist.   8)Anxiety/depression -- Zoloft 25 mg p.o. daily -- Xanax 0.5 mg p.o. nightly as needed anxiety -Stable mood.   9)Type 2 diabetes mellitus Hemoglobin A1c 5.6 on  12/24/2022--reflecting excellent diabetic control PTA Use Novolog/Humalog Sliding scale insulin with Accu-Cheks/Fingersticks as ordered    10)GERD with superimposed Acute GI bleed -- Protonix 40 mg bid  -- Pepcid 20 mg bid  11)Paroxysmal atrial fibrillation/Mechanical AVR on Coumadin -Continue metoprolol for rate control -Pharmacy to help manage Coumadin therapy  12)Social/ethics--- DNR/DNI,  -Multiple comorbid conditions impacting overall prognosis -ReCurrent hospitalizations Previously seen by palliative care team  13)HTN--continue clonidine patch, amlodipine and hydralazine as well as metoprolol for BP control -IV labetalol as needed elevated BP  14)HypoNatremia--- on admission sodium was 127  -Sodium is improving with hydration  15) chronic pain syndrome--continue Duragesic patch   Status is: Inpatient   Disposition: The patient is from: Home              Anticipated d/c is to: Home              Anticipated d/c date is: 1-2 days              Patient currently is not medically stable to d/c. Barriers: Not  Clinically Stable-   Code  Status :  -  Code Status: DNR   Family Communication:    NA (patient is alert, awake and coherent)   DVT Prophylaxis  :   - SCDs / Coumadin warfarin (COUMADIN) tablet 2.5 mg   Lab Results  Component Value Date   PLT 150 01/16/2023    Inpatient Medications  Scheduled Meds:  allopurinol  100 mg Oral Daily   ALPRAZolam  0.5 mg Oral QHS   amLODipine  10 mg Oral Daily   atorvastatin  80 mg Oral q1800   cloNIDine  0.3 mg Transdermal Q Sat-1800   doxycycline  100 mg Oral Q12H   famotidine  20 mg Oral Daily   fentaNYL  1 patch Transdermal Q72H   hydrALAZINE  100 mg Oral TID   insulin aspart  0-5 Units Subcutaneous QHS   insulin aspart  0-9 Units Subcutaneous TID WC   metoprolol succinate  25 mg Oral Daily   pantoprazole  40 mg Oral BID   sertraline  25 mg Oral Daily   tadalafil (PAH)  40 mg Oral Daily   warfarin  2.5 mg Oral ONCE-1600   Warfarin - Pharmacist Dosing Inpatient   Does not apply q1600   Continuous Infusions:  cefTRIAXone (ROCEPHIN)  IV 1 g (01/17/23 1210)    PRN Meds:.albuterol, guaiFENesin-dextromethorphan, ipratropium-albuterol, labetalol, loperamide, ondansetron   Anti-infectives (From admission, onward)    Start     Dose/Rate Route Frequency Ordered Stop   01/16/23 1115  cefTRIAXone (ROCEPHIN) 1 g in sodium chloride 0.9 % 100 mL IVPB        1 g 200 mL/hr over 30 Minutes Intravenous Every 24 hours 01/16/23 1027     01/16/23 1115  doxycycline (VIBRA-TABS) tablet 100 mg        100 mg Oral Every 12 hours 01/16/23 1027     01/13/23 1800  azithromycin (ZITHROMAX) 500 mg in sodium chloride 0.9 % 250 mL IVPB        500 mg 250 mL/hr over 60 Minutes Intravenous Every 24 hours 01/13/23 1711 01/14/23 1851         Subjective: [name] t chronically ill and deconditioned on examination; 2 L nasal cannula in place.  Reported increase productive coughing spells and general malaise.  No chest pain, no nausea, no vomiting.  Objective: Vitals:   01/16/23 1425  01/16/23 1944 01/16/23 2028 01/17/23 0527  BP: 131/68  (!) 142/54 (!) 160/50  Pulse: 69  76 79  Resp: 20  20 20   Temp: 99.3 F (37.4 C)  98.6 F (37 C) 98.3 F (36.8 C)  TempSrc: Oral  Oral   SpO2: 97% 95% 97% 96%  Weight:      Height:        Intake/Output Summary (Last 24 hours) at 01/17/2023 1259 Last data filed at 01/17/2023 0900 Gross per 24 hour  Intake 1060 ml  Output 800 ml  Net 260 ml   Filed Weights   01/13/23 1214 01/13/23 1747  Weight: 53.8 kg 56 kg   Physical Exam General exam: Alert, awake, oriented x 3; months appropriately; in no acute distress.  Reports overall feeling slightly better.  Still weak, deconditioned and complaining of productive coughing spells while using 2 L nasal cannula supplementation. Respiratory system: Positive scattered rhonchi; no using accessory muscles. Cardiovascular system: Irregular, positive mechanical click appreciated; no rubs or gallops. Gastrointestinal system: Abdomen is nondistended, soft and nontender. No organomegaly or masses felt. Normal bowel sounds heard. Central  nervous system: Alert and oriented. No focal neurological deficits. Extremities: No trace edema; no cyanosis or clubbing. Skin: No petechiae. Psychiatry: Judgement and insight appear normal.   Data Reviewed: I have personally reviewed following labs and imaging studies  CBC: Recent Labs  Lab 01/13/23 1220 01/14/23 0358 01/15/23 0440 01/16/23 0850  WBC 13.0* 11.6* 9.6 5.2  NEUTROABS 10.7*  --   --   --   HGB 10.9* 9.3* 10.5* 9.4*  HCT 34.5* 29.7* 35.1* 30.8*  MCV 98.9 99.3 104.5* 100.0  PLT 157 149* 161 150   Basic Metabolic Panel: Recent Labs  Lab 01/13/23 1220 01/14/23 0358 01/15/23 0440 01/16/23 0850 01/17/23 0401  NA 127* 133* 132* 131* 133*  K 3.8 3.5 4.0 4.1 4.3  CL 99 104 104 103 105  CO2 16* 19* 17* 18* 20*  GLUCOSE 207* 100* 132* 154* 144*  BUN 63* 61* 59* 59* 58*  CREATININE 2.73* 2.40* 2.31* 2.35* 2.32*  CALCIUM 8.7* 8.5* 8.5*  8.1* 8.2*  PHOS  --   --  4.8*  --  3.8   GFR: Estimated Creatinine Clearance: 13.4 mL/min (A) (by C-G formula based on SCr of 2.32 mg/dL (H)). Liver Function Tests: Recent Labs  Lab 01/13/23 1220 01/15/23 0440 01/17/23 0401  AST 26  --   --   ALT 11  --   --   ALKPHOS 109  --   --   BILITOT 0.7  --   --   PROT 6.4*  --   --   ALBUMIN 3.4* 3.1* 2.9*   Radiology Studies: DG CHEST PORT 1 VIEW  Result Date: 01/16/2023 CLINICAL DATA:  Dyspnea and respiratory abnormalities. EXAM: PORTABLE CHEST 1 VIEW COMPARISON:  12/24/2022 FINDINGS: The cardio pericardial silhouette is enlarged. Interstitial markings are diffusely coarsened with chronic features. Interval development of retrocardiac left base collapse/consolidation with small left pleural effusion. Calcified granuloma again noted right mid lung. Bones are diffusely demineralized. IMPRESSION: Interval development of retrocardiac left base collapse/consolidation with small left pleural effusion. Electronically Signed   By: Kennith Center M.D.   On: 01/16/2023 08:54    Scheduled Meds:  allopurinol  100 mg Oral Daily   ALPRAZolam  0.5 mg Oral QHS   amLODipine  10 mg Oral Daily   atorvastatin  80 mg Oral q1800   cloNIDine  0.3 mg Transdermal Q Sat-1800   doxycycline  100 mg Oral Q12H   famotidine  20 mg Oral Daily   fentaNYL  1 patch Transdermal Q72H   hydrALAZINE  100 mg Oral TID   insulin aspart  0-5 Units Subcutaneous QHS   insulin aspart  0-9 Units Subcutaneous TID WC   metoprolol succinate  25 mg Oral Daily   pantoprazole  40 mg Oral BID   sertraline  25 mg Oral Daily   tadalafil (PAH)  40 mg Oral Daily   warfarin  2.5 mg Oral ONCE-1600   Warfarin - Pharmacist Dosing Inpatient   Does not apply q1600   Continuous Infusions:  cefTRIAXone (ROCEPHIN)  IV 1 g (01/17/23 1210)     LOS: 4 days   Vassie Loll M.D on 01/17/2023 at 12:59 PM  Go to [email or web address] - for contact info  [facility] - Office  [phone]  If  7PM-7AM, please contact night-coverage [email or web address] 01/17/2023, 12:59 PM

## 2023-01-17 NOTE — Progress Notes (Signed)
Patient ambulated a short distance without O2 and maintained a pulse of 91%. Patient's heart rate increased to 140s and she became short of breath while ambulating

## 2023-01-17 NOTE — Progress Notes (Signed)
Mobility Specialist Progress Note:    01/17/23 1015  Mobility  Activity Ambulated with assistance in hallway  Level of Assistance Contact guard assist, steadying assist  Assistive Device Front wheel walker  Distance Ambulated (ft) 80 ft  Activity Response Tolerated well  Mobility Referral Yes  $Mobility charge 1 Mobility   Pt agreeable to mobility session. Tolerated ambulation in hallway well, SpO2 97% on2L. CGA required for safety with RW. Returned pt to room, all needs met.   Feliciana Rossetti Mobility Specialist Please contact via Special educational needs teacher or  Rehab office at [phone]

## 2023-01-17 NOTE — Progress Notes (Signed)
ANTICOAGULATION CONSULT NOTE  Pharmacy Consult for warfarin Indication: atrial fibrillation/ mechanical valve  Allergies  Allergen Reactions   Augmentin [Amoxicillin-Pot Clavulanate] Diarrhea and Nausea And Vomiting   Ciprofloxacin Diarrhea, Nausea Only and Other (See Comments)    Stomach ache   Codeine     Insomnia, anxiety   Coreg [Carvedilol] Other (See Comments)    Terrible cramping in the feet and had a lot of bowel movements, but not diarrhea   Diflucan [Fluconazole] Diarrhea   Levofloxacin In D5w     Vomiting, diarrhea, insomnia   Losartan Swelling and Other (See Comments)    Patient doesn't recall site of swelling   Privigen [Immune Globulin (Human)] Other (See Comments)    Severe/excruciating pain   Sulfamethoxazole-Trimethoprim Diarrhea, Nausea Only and Other (See Comments)    Stomach upset   Verapamil Hives   Zetia [Ezetimibe] Other (See Comments)    Reaction not recalled   Zocor [Simvastatin - High Dose] Other (See Comments)    Reaction not recalled   Gatifloxacin Rash and Other (See Comments)    Redness to skin around eye    Patient Measurements: Height: 4\' 10"  (147.3 cm) Weight: 56 kg (123 lb 7.3 oz) IBW/kg (Calculated) : 40.9  Vital Signs: Temp: 98.3 F (36.8 C) (04/10 0527) BP: 160/50 (04/10 0527) Pulse Rate: 79 (04/10 0527)  Labs: Recent Labs    01/15/23 0440 01/15/23 0839 01/16/23 0404 01/16/23 0850 01/17/23 0401  HGB 10.5*  --   --  9.4*  --   HCT 35.1*  --   --  30.8*  --   PLT 161  --   --  150  --   LABPROT  --  34.3* 38.1*  --  35.0*  INR  --  3.4* 3.9*  --  3.5*  CREATININE 2.31*  --   --  2.35* 2.32*     Estimated Creatinine Clearance: 13.4 mL/min (A) (by C-G formula based on SCr of 2.32 mg/dL (H)).   Medical History: Past Medical History:  Diagnosis Date   Aortic atherosclerosis 01/23/2018   Atrial fibrillation    Benign positional vertigo 10/06/2011   Carotid artery disease    Chemotherapy induced neutropenia 07/21/2019    Cholelithiasis 01/23/2018   Cholelithiasis 01/23/2018   Chronic anticoagulation    Colovesical fistula, ruled out 04/14/2022   Coronary artery disease    status post coronary artery bypass grafting times 07/10/2004   Diabetes mellitus    Hypercholesteremia    Hypertension    Mechanical heart valve present    H. aortic valve replacement at the time of bypass surgery October 2005   Moderate to severe pulmonary hypertension    Peripheral arterial disease    history of left common iliac artery PTA and stenting for a chronic total occlusion 08/26/01   S/P cholecystectomy 03/06/2018    Assessment: 82 YOF on warfarin for atrial fibrillation admitted for abdominal pain and AKI. Pharmacy consulted to continue warfarin during admission.  Patient reports some blood when wiping and dark stool; however, does take iron daily. No concerns for GI bleeding at this time.  PTA regimen 5mg  daily - last dose 4/5 PM INR 2.6> 2.9>3.4> 3.9>3.5  Patient taking 5mg  daily prior to admit.   Goal of Therapy:  INR goal 2.5-3.5 per anti-coag clinic note Monitor platelets by anticoagulation protocol: Yes   Plan:  Warfarin 2.5mg  x1 today Daily INR/CBC  Sheppard Coil PharmD., BCPS Clinical Pharmacist 01/17/2023 9:04 AM

## 2023-01-18 DIAGNOSIS — N179 Acute kidney failure, unspecified: Secondary | ICD-10-CM | POA: Diagnosis not present

## 2023-01-18 DIAGNOSIS — I5032 Chronic diastolic (congestive) heart failure: Secondary | ICD-10-CM | POA: Diagnosis not present

## 2023-01-18 DIAGNOSIS — K529 Noninfective gastroenteritis and colitis, unspecified: Secondary | ICD-10-CM | POA: Diagnosis not present

## 2023-01-18 DIAGNOSIS — I482 Chronic atrial fibrillation, unspecified: Secondary | ICD-10-CM | POA: Diagnosis not present

## 2023-01-18 LAB — GLUCOSE, CAPILLARY
Glucose-Capillary: 127 mg/dL — ABNORMAL HIGH (ref 70–99)
Glucose-Capillary: 129 mg/dL — ABNORMAL HIGH (ref 70–99)
Glucose-Capillary: 138 mg/dL — ABNORMAL HIGH (ref 70–99)
Glucose-Capillary: 116 mg/dL — ABNORMAL HIGH (ref 70–99)

## 2023-01-18 LAB — PROTIME-INR
INR: 3 — ABNORMAL HIGH (ref 0.8–1.2)
Prothrombin Time: 30.5 seconds — ABNORMAL HIGH (ref 11.4–15.2)

## 2023-01-18 MED ORDER — WARFARIN SODIUM 2 MG PO TABS
4.0000 mg | ORAL_TABLET | Freq: Once | ORAL | Status: AC
  Administered 2023-01-18: 4 mg via ORAL
  Filled 2023-01-18: qty 2

## 2023-01-18 MED ORDER — FUROSEMIDE 20 MG PO TABS
20.0000 mg | ORAL_TABLET | Freq: Every day | ORAL | Status: DC
  Administered 2023-01-18 – 2023-01-20 (×3): 20 mg via ORAL
  Filled 2023-01-18 (×3): qty 1

## 2023-01-18 NOTE — Progress Notes (Addendum)
SATURATION QUALIFICATIONS: (This note is used to comply with regulatory documentation for home oxygen)  Patient Saturations on Room Air at Rest = 94%  Patient Saturations on Room Air while Ambulating = 90%    

## 2023-01-18 NOTE — Progress Notes (Signed)
PROGRESS NOTE  [name], is a 82 y.o. female, DOB - [birth year], YKZ:[id number]  Admit date - 01/13/2023   Admitting Physician No admitting provider for patient encounter.  Outpatient Primary MD for the patient is Branch, [name], MD  LOS - 5  Chief Complaint  Patient presents with   Abdominal Pain      Brief Narrative:  82 y.o. 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 -Patient chronically on Lasix. -Baseline creatinine usually 1.5-2.2 -On admission on 01/13/2023 creatinine was up to 2.73 and BUN was up to 63 -Creatinine currently trending down and close to baseline. Currently 2.3 -Continue to renally adjust medications, avoid nephrotoxic agents / dehydration  / hypotension -restart low dose daily lasix and follow response.  2) acute hypoxic respiratory failure--new on 01/16/2023 -On 01/16/2023 patient developed respiratory distress with hypoxia requiring oxygen chest x-ray suggesting left-sided pneumonia -Continue treatment with Rocephin and doxycycline as prescribed -Continue mucolytics and PRN bronchodilators  -Continue to wean off oxygen supplementation as tolerated;  -Follow-desaturation screening results; currently requiring 1-1.5 L per nasal cannula supplementation.[name]  3) chronic anemia--multifactorial including underlying CKD -Patient with recent GI bleed -Patient required transfusion back in March 2024 -Hgb currently above 9 -Watch closely for any bleeding as patient is on chronic anticoagulation with Coumadin. -No overt bleeding appreciated-continue to follow hemoglobin  trend.  4)Diarrhea--recent antibiotic treatment for colitis,  -WBC 13.0 >> 11.6>>9.6> 5.2 -Stool for C. difficile and stool culture requested, but was not sent-patient's diarrhea appears to have resolved -Hold off on further antibiotic therapy for C. difficile. -If needed will add the use of Florastor. -Continue to maintain adequate hydration.  5)Chronic diastolic congestive heart failure, compensated -Concerns for dehydration due to GI losses at time of admission -Continue closely following volume status and daily weight. -Start adjusted dose of oral Lasix and follow urine output/daily weights.   6)CAD s/p CABG/PAD---chest pain-free at this time -- Continue atorvastatin and metoprolol -- Hold aspirin for concern of GI bleed  -Patient is already on Coumadin for A-fib and mechanical aortic valve   7)Pulmonary hypertension -c/n Tadalafil and outpatient follow-up with pulmonologist/cardiologist.   8)Anxiety/depression -- Zoloft 25 mg p.o. daily -- Xanax 0.5 mg p.o. nightly as needed anxiety -Stable mood.   9)Type 2 diabetes mellitus Hemoglobin A1c 5.6 on  12/24/2022--reflecting excellent diabetic control PTA Use Novolog/Humalog Sliding scale insulin with Accu-Cheks/Fingersticks as ordered    10)GERD with superimposed Acute GI bleed -- Protonix 40 mg bid  -- Pepcid 20 mg bid  11)Paroxysmal atrial fibrillation/Mechanical AVR on Coumadin -Continue metoprolol for rate control -Pharmacy to help manage Coumadin therapy  12)Social/ethics--- DNR/DNI,  -Multiple comorbid conditions impacting overall prognosis -ReCurrent hospitalizations Previously seen by palliative care team  13)HTN--continue clonidine patch, amlodipine and hydralazine as well as metoprolol for BP control -IV labetalol as needed elevated BP  14)HypoNatremia--- on admission sodium was 127  -Sodium is improving with hydration  15) chronic pain syndrome--continue Duragesic patch   Status is: Inpatient    Disposition: The patient is from: Home              Anticipated d/c is to: Home  Anticipated d/c date is: 1-2 days              Patient currently is not medically stable to d/c. Barriers: Not Clinically Stable-   Code Status :  -  Code Status: DNR   Family Communication:    NA (patient is alert, awake and coherent)   DVT Prophylaxis  :   - SCDs / Coumadin warfarin (COUMADIN) tablet 4 mg   Lab Results  Component Value Date   PLT 150 01/16/2023    Inpatient Medications  Scheduled Meds:  allopurinol  100 mg Oral Daily   ALPRAZolam  0.5 mg Oral QHS   amLODipine  10 mg Oral Daily   atorvastatin  80 mg Oral q1800   cloNIDine  0.3 mg Transdermal Q Sat-1800   dextromethorphan-guaiFENesin  1 tablet Oral BID   doxycycline  100 mg Oral Q12H   famotidine  20 mg Oral Daily   fentaNYL  1 patch Transdermal Q72H   hydrALAZINE  100 mg Oral TID   insulin aspart  0-5 Units Subcutaneous QHS   insulin aspart  0-9 Units Subcutaneous TID WC   metoprolol succinate  25 mg Oral Daily   pantoprazole  40 mg Oral BID   sertraline  25 mg Oral Daily   tadalafil (PAH)  40 mg Oral Daily   warfarin  4 mg Oral ONCE-1600   Warfarin - Pharmacist Dosing Inpatient   Does not apply q1600   Continuous Infusions:  cefTRIAXone (ROCEPHIN)  IV 1 g (01/18/23 1137)    PRN Meds:.acetaminophen, albuterol, ipratropium-albuterol, labetalol, loperamide, ondansetron   Anti-infectives (From admission, onward)    Start     Dose/Rate Route Frequency Ordered Stop   01/16/23 1115  cefTRIAXone (ROCEPHIN) 1 g in sodium chloride 0.9 % 100 mL IVPB        1 g 200 mL/hr over 30 Minutes Intravenous Every 24 hours 01/16/23 1027     01/16/23 1115  doxycycline (VIBRA-TABS) tablet 100 mg        100 mg Oral Every 12 hours 01/16/23 1027     01/13/23 1800  azithromycin (ZITHROMAX) 500 mg in sodium chloride 0.9 % 250 mL IVPB        500 mg 250 mL/hr over 60 Minutes Intravenous Every 24 hours 01/13/23 1711 01/14/23  1851         Subjective: [name] chronically ill and deconditioned in appearance; denies chest pain, no nausea, no vomiting.  Still experiencing intermittent productive coughing spells and having mild orthopnea with noticing lower extremity swelling and decreased urine output.  Patient is afebrile.  Objective: Vitals:   01/17/23 0527 01/17/23 1628 01/17/23 2122 01/18/23 0335  BP: (!) 160/50 (!) 163/64 (!) 158/68 (!) 164/58  Pulse: 79 83 69 78  Resp: 20 (!) 24 20 18   Temp: 98.3 F (36.8 C) 98.2 F (36.8 C) 98.3 F (36.8 C) 98.8 F (37.1 C)  TempSrc:   Oral   SpO2: 96% 97% 98% 97%  Weight:      Height:        Intake/Output Summary (Last 24 hours) at 01/18/2023 1405 Last data filed at 01/18/2023 0500 Gross per 24 hour  Intake 720 ml  Output 500 ml  Net 220 ml   Filed Weights   01/13/23 1214 01/13/23 1747  Weight: 53.8 kg 56 kg   Physical Exam General exam: Alert, awake, oriented x 3; reporting a slightly better, noticing decreasing her urine output and increasing lower extremity swelling.  Continues to have  intermittent coughing spells (productive), also expressing mild orthopnea. Respiratory system: Decreased breath sounds at the bases without frank crackles; positive rhonchi bilaterally.  No wheezing.  Good saturation on 1-1.5 L nasal cannula supplementation Cardiovascular system: Rate controlled; positive metallic click and soft murmur.  No rubs or gallops. Gastrointestinal system: Abdomen is nondistended, soft and nontender. No organomegaly or masses felt. Normal bowel sounds heard. Central nervous system: Alert and oriented. No focal neurological deficits. Extremities: No trace edema bilaterally appreciated; no cyanosis. Skin: No petechiae. Psychiatry: Judgement and insight appear normal. Mood & affect appropriate.   Data Reviewed: I have personally reviewed following labs and imaging studies  CBC: Recent Labs  Lab 01/13/23 1220 01/14/23 0358 01/15/23 0440  01/16/23 0850  WBC 13.0* 11.6* 9.6 5.2  NEUTROABS 10.7*  --   --   --   HGB 10.9* 9.3* 10.5* 9.4*  HCT 34.5* 29.7* 35.1* 30.8*  MCV 98.9 99.3 104.5* 100.0  PLT 157 149* 161 150   Basic Metabolic Panel: Recent Labs  Lab 01/13/23 1220 01/14/23 0358 01/15/23 0440 01/16/23 0850 01/17/23 0401  NA 127* 133* 132* 131* 133*  K 3.8 3.5 4.0 4.1 4.3  CL 99 104 104 103 105  CO2 16* 19* 17* 18* 20*  GLUCOSE 207* 100* 132* 154* 144*  BUN 63* 61* 59* 59* 58*  CREATININE 2.73* 2.40* 2.31* 2.35* 2.32*  CALCIUM 8.7* 8.5* 8.5* 8.1* 8.2*  PHOS  --   --  4.8*  --  3.8   GFR: Estimated Creatinine Clearance: 13.4 mL/min (A) (by C-G formula based on SCr of 2.32 mg/dL (H)).  Liver Function Tests: Recent Labs  Lab 01/13/23 1220 01/15/23 0440 01/17/23 0401  AST 26  --   --   ALT 11  --   --   ALKPHOS 109  --   --   BILITOT 0.7  --   --   PROT 6.4*  --   --   ALBUMIN 3.4* 3.1* 2.9*   Radiology Studies: No results found.  Scheduled Meds:  allopurinol  100 mg Oral Daily   ALPRAZolam  0.5 mg Oral QHS   amLODipine  10 mg Oral Daily   atorvastatin  80 mg Oral q1800   cloNIDine  0.3 mg Transdermal Q Sat-1800   dextromethorphan-guaiFENesin  1 tablet Oral BID   doxycycline  100 mg Oral Q12H   famotidine  20 mg Oral Daily   fentaNYL  1 patch Transdermal Q72H   hydrALAZINE  100 mg Oral TID   insulin aspart  0-5 Units Subcutaneous QHS   insulin aspart  0-9 Units Subcutaneous TID WC   metoprolol succinate  25 mg Oral Daily   pantoprazole  40 mg Oral BID   sertraline  25 mg Oral Daily   tadalafil (PAH)  40 mg Oral Daily   warfarin  4 mg Oral ONCE-1600   Warfarin - Pharmacist Dosing Inpatient   Does not apply q1600   Continuous Infusions:  cefTRIAXone (ROCEPHIN)  IV 1 g (01/18/23 1137)     LOS: 5 days   Vassie Loll M.D on 01/18/2023 at 2:05 PM  Go to [email or web address] - for contact info  [facility] - Office  [phone]  If 7PM-7AM, please contact  night-coverage [email or web address] 01/18/2023, 2:05 PM

## 2023-01-18 NOTE — TOC Progression Note (Signed)
Transition of Care [facility]) - Progression Note    Patient Details  Name: [name] MRN: [id number] Date of Birth: 07/01/1941  Transition of Care [facility] CM/SW Contact  Leitha Bleak, RN Phone Number: 01/18/2023, 3:23 PM  Clinical Narrative:   TOC following for home oxygen saturation qualification.   Expected Discharge Plan: Home w Home Health Services Barriers to Discharge: Continued Medical Work up  Expected Discharge Plan and Services In-house Referral: Clinical Social Work   Post Acute Care Choice: Resumption of Svcs/PTA Provider Living arrangements for the past 2 months: Single Family Home                    Social Determinants of Health (SDOH) Interventions SDOH Screenings   Food Insecurity: No Food Insecurity (12/25/2022)  Housing: Low Risk  (12/25/2022)  Transportation Needs: No Transportation Needs (12/25/2022)  Utilities: Not At Risk (12/25/2022)  Alcohol Screen: Low Risk  (02/13/2022)  Depression (PHQ2-9): Low Risk  (02/13/2022)  Financial Resource Strain: Low Risk  (02/28/2022)  Physical Activity: Inactive (02/28/2022)  Social Connections: Moderately Integrated (06/22/2022)  Stress: No Stress Concern Present (02/28/2022)  Tobacco Use: Medium Risk (01/13/2023)    Readmission Risk Interventions    01/14/2023   12:20 PM 12/25/2022    9:36 AM 08/30/2022   11:42 AM  Readmission Risk Prevention Plan  Transportation Screening Complete Complete Complete  Medication Review Oceanographer) Complete Complete Complete  HRI or Home Care Consult Complete Complete Complete  SW Recovery Care/Counseling Consult Complete Complete Complete  Palliative Care Screening Not Applicable Not Applicable Not Applicable  Skilled Nursing Facility Not Applicable Not Applicable Not Applicable

## 2023-01-18 NOTE — Care Management Important Message (Signed)
Important Message  Patient Details  Name: [name] MRN: [id number] Date of Birth: 1941/08/15   Medicare Important Message Given:  Yes     Corey Harold 01/18/2023, 10:53 AM

## 2023-01-18 NOTE — Progress Notes (Signed)
ANTICOAGULATION CONSULT NOTE  Pharmacy Consult for warfarin Indication: atrial fibrillation/ mechanical valve  Allergies  Allergen Reactions   Augmentin [Amoxicillin-Pot Clavulanate] Diarrhea and Nausea And Vomiting   Ciprofloxacin Diarrhea, Nausea Only and Other (See Comments)    Stomach ache   Codeine     Insomnia, anxiety   Coreg [Carvedilol] Other (See Comments)    Terrible cramping in the feet and had a lot of bowel movements, but not diarrhea   Diflucan [Fluconazole] Diarrhea   Levofloxacin In D5w     Vomiting, diarrhea, insomnia   Losartan Swelling and Other (See Comments)    Patient doesn't recall site of swelling   Privigen [Immune Globulin (Human)] Other (See Comments)    Severe/excruciating pain   Sulfamethoxazole-Trimethoprim Diarrhea, Nausea Only and Other (See Comments)    Stomach upset   Verapamil Hives   Zetia [Ezetimibe] Other (See Comments)    Reaction not recalled   Zocor [Simvastatin - High Dose] Other (See Comments)    Reaction not recalled   Gatifloxacin Rash and Other (See Comments)    Redness to skin around eye    Patient Measurements: Height: 4\' 10"  (147.3 cm) Weight: 56 kg (123 lb 7.3 oz) IBW/kg (Calculated) : 40.9  Vital Signs: Temp: 98.8 F (37.1 C) (04/11 0335) Temp Source: Oral (04/10 2122) BP: 164/58 (04/11 0335) Pulse Rate: 78 (04/11 0335)  Labs: Recent Labs    01/16/23 0404 01/16/23 0850 01/17/23 0401 01/18/23 0406  HGB  --  9.4*  --   --   HCT  --  30.8*  --   --   PLT  --  150  --   --   LABPROT 38.1*  --  35.0* 30.5*  INR 3.9*  --  3.5* 3.0*  CREATININE  --  2.35* 2.32*  --      Estimated Creatinine Clearance: 13.4 mL/min (A) (by C-G formula based on SCr of 2.32 mg/dL (H)).   Medical History: Past Medical History:  Diagnosis Date   Aortic atherosclerosis 01/23/2018   Atrial fibrillation    Benign positional vertigo 10/06/2011   Carotid artery disease    Chemotherapy induced neutropenia 07/21/2019    Cholelithiasis 01/23/2018   Cholelithiasis 01/23/2018   Chronic anticoagulation    Colovesical fistula, ruled out 04/14/2022   Coronary artery disease    status post coronary artery bypass grafting times 07/10/2004   Diabetes mellitus    Hypercholesteremia    Hypertension    Mechanical heart valve present    H. aortic valve replacement at the time of bypass surgery October 2005   Moderate to severe pulmonary hypertension    Peripheral arterial disease    history of left common iliac artery PTA and stenting for a chronic total occlusion 08/26/01   S/P cholecystectomy 03/06/2018    Assessment: 82 YOF on warfarin for atrial fibrillation admitted for abdominal pain and AKI. Pharmacy consulted to continue warfarin during admission.  Patient reports some blood when wiping and dark stool; however, does take iron daily. No concerns for GI bleeding at this time.  PTA regimen 5mg  daily - last dose 4/5 PM INR 2.6> 2.9>3.4> 3.9>3.5>3.0  Patient taking 5mg  daily prior to admit.   Goal of Therapy:  INR goal 2.5-3.5 per anti-coag clinic note Monitor platelets by anticoagulation protocol: Yes   Plan:  Warfarin 4 mg x1 today Daily INR/CBC  Judeth Cornfield, PharmD Clinical Pharmacist 01/18/2023 8:15 AM

## 2023-01-19 ENCOUNTER — Inpatient Hospital Stay: Payer: Medicare HMO

## 2023-01-19 DIAGNOSIS — N179 Acute kidney failure, unspecified: Secondary | ICD-10-CM | POA: Diagnosis not present

## 2023-01-19 DIAGNOSIS — I5032 Chronic diastolic (congestive) heart failure: Secondary | ICD-10-CM | POA: Diagnosis not present

## 2023-01-19 DIAGNOSIS — I482 Chronic atrial fibrillation, unspecified: Secondary | ICD-10-CM | POA: Diagnosis not present

## 2023-01-19 DIAGNOSIS — K529 Noninfective gastroenteritis and colitis, unspecified: Secondary | ICD-10-CM | POA: Diagnosis not present

## 2023-01-19 LAB — GLUCOSE, CAPILLARY
Glucose-Capillary: 116 mg/dL — ABNORMAL HIGH (ref 70–99)
Glucose-Capillary: 155 mg/dL — ABNORMAL HIGH (ref 70–99)
Glucose-Capillary: 116 mg/dL — ABNORMAL HIGH (ref 70–99)
Glucose-Capillary: 179 mg/dL — ABNORMAL HIGH (ref 70–99)

## 2023-01-19 LAB — PROTIME-INR
INR: 3.2 — ABNORMAL HIGH (ref 0.8–1.2)
Prothrombin Time: 32.2 seconds — ABNORMAL HIGH (ref 11.4–15.2)

## 2023-01-19 MED ORDER — WARFARIN SODIUM 2.5 MG PO TABS
5.0000 mg | ORAL_TABLET | Freq: Every day | ORAL | Status: DC
  Filled 2023-01-19: qty 2

## 2023-01-19 MED ORDER — WARFARIN SODIUM 2 MG PO TABS
4.0000 mg | ORAL_TABLET | Freq: Once | ORAL | Status: AC
  Administered 2023-01-19: 4 mg via ORAL
  Filled 2023-01-19: qty 2

## 2023-01-19 NOTE — Progress Notes (Signed)
PROGRESS NOTE  [name], is a 82 y.o. female, DOB - 02-Mar-1941, ZOX:[id number]  Admit date - 01/13/2023   Admitting Physician No admitting provider for patient encounter.  Outpatient Primary MD for the patient is [name], [name] Pea, MD  LOS - 6  Chief Complaint  Patient presents with   Abdominal Pain      Brief Narrative:  82 y.o. 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 -Patient chronically on Lasix. -Baseline creatinine usually 1.5-1.7 -On admission on 01/13/2023 creatinine was up to 2.73 and BUN was up to 63 -Creatinine currently trending down close to baseline. -Continue to renally adjust medications, avoid nephrotoxic agents / dehydration  / hypotension  2) acute hypoxic respiratory failure--new on 01/16/2023 -On 01/16/2023 patient developed respiratory distress with hypoxia requiring oxygen chest x-ray suggesting left-sided pneumonia -Continue treatment with Rocephin and doxycycline as prescribed -Continue mucolytics and PRN bronchodilators  -Continue to wean off oxygen supplementation as tolerated; checking desaturation screening. -Hopefully discharge in the next 24-48 hours. -Continue supportive care and follow clinical response.  3) chronic anemia--multifactorial including underlying CKD -Patient with recent GI bleed -Patient required transfusion back in March 2024 -Hgb currently above 9 -Watch closely for any bleeding as patient is on chronic anticoagulation with Coumadin. -No overt bleeding appreciated-continue to follow hemoglobin trend.  4)Diarrhea--recent antibiotic treatment for  colitis,  -WBC 13.0 >> 11.6>>9.6> 5.2 -Stool for C. difficile and stool culture requested, but was not sent-patient's diarrhea appears to have resolved -Hold off on further antibiotic therapy for C. difficile. -If needed will add the use of Florastor. -Continue to maintain adequate hydration.  5)Chronic diastolic congestive heart failure, compensated -Concerns for dehydration due to GI losses -Continue to hold Lasix -Continue closely following volume status and daily weight.   6)CAD s/p CABG/PAD---chest pain-free at this time -- Continue atorvastatin and metoprolol -- Hold aspirin for concern of GI bleed  -Patient is already on Coumadin for A-fib and mechanical aortic valve   7)Pulmonary hypertension -c/n Tadalafil and outpatient follow-up with pulmonologist/cardiologist.   8)Anxiety/depression -- Zoloft 25 mg p.o. daily -- Xanax 0.5 mg p.o. nightly as needed anxiety -Stable mood.   9)Type 2 diabetes mellitus Hemoglobin A1c 5.6 on  12/24/2022--reflecting excellent diabetic control PTA Use Novolog/Humalog Sliding scale insulin with Accu-Cheks/Fingersticks as ordered    10)GERD with superimposed Acute GI bleed -- Protonix 40 mg bid  -- Pepcid 20 mg bid  11)Paroxysmal atrial fibrillation/Mechanical AVR on Coumadin -Continue metoprolol for rate control -Pharmacy to help manage Coumadin therapy  12)Social/ethics--- DNR/DNI,  -Multiple comorbid conditions impacting overall prognosis -ReCurrent hospitalizations Previously seen by palliative care team  13)HTN--continue clonidine patch, amlodipine and hydralazine as well as metoprolol for BP control -IV labetalol as needed elevated BP  14)HypoNatremia--- on admission sodium was 127  -Sodium is improving with hydration  15) chronic pain syndrome--continue Duragesic patch   Status is: Inpatient   Disposition: The patient is from: Home              Anticipated d/c is to: Home              Anticipated d/c date is: 1-2 days  Patient currently is not medically stable to d/c. Barriers: Not Clinically Stable-   Code Status :  -  Code Status: DNR   Family Communication:    NA (patient is alert, awake and coherent)   DVT Prophylaxis  :   - SCDs / Coumadin warfarin (COUMADIN) tablet 5 mg   Lab Results  Component Value Date   PLT 150 01/16/2023    Inpatient Medications  Scheduled Meds:  allopurinol  100 mg Oral Daily   ALPRAZolam  0.5 mg Oral QHS   amLODipine  10 mg Oral Daily   atorvastatin  80 mg Oral q1800   cloNIDine  0.3 mg Transdermal Q Sat-1800   dextromethorphan-guaiFENesin  1 tablet Oral BID   doxycycline  100 mg Oral Q12H   famotidine  20 mg Oral Daily   fentaNYL  1 patch Transdermal Q72H   furosemide  20 mg Oral Daily   hydrALAZINE  100 mg Oral TID   insulin aspart  0-5 Units Subcutaneous QHS   insulin aspart  0-9 Units Subcutaneous TID WC   metoprolol succinate  25 mg Oral Daily   pantoprazole  40 mg Oral BID   sertraline  25 mg Oral Daily   tadalafil (PAH)  40 mg Oral Daily   warfarin  5 mg Oral q1600   Warfarin - Pharmacist Dosing Inpatient   Does not apply q1600   Continuous Infusions:  cefTRIAXone (ROCEPHIN)  IV 1 g (01/19/23 1225)    PRN Meds:.acetaminophen, albuterol, ipratropium-albuterol, labetalol, loperamide, ondansetron   Anti-infectives (From admission, onward)    Start     Dose/Rate Route Frequency Ordered Stop   01/16/23 1115  cefTRIAXone (ROCEPHIN) 1 g in sodium chloride 0.9 % 100 mL IVPB        1 g 200 mL/hr over 30 Minutes Intravenous Every 24 hours 01/16/23 1027     01/16/23 1115  doxycycline (VIBRA-TABS) tablet 100 mg        100 mg Oral Every 12 hours 01/16/23 1027     01/13/23 1800  azithromycin (ZITHROMAX) 500 mg in sodium chloride 0.9 % 250 mL IVPB        500 mg 250 mL/hr over 60 Minutes Intravenous Every 24 hours 01/13/23 1711 01/14/23 1851         Subjective: [name] chronically ill and deconditioned on examination; no fever, no  chest pain.  Reporting worsening productive coughing spells and increased shortness of breath with activity.  Requiring 2 L nasal cannula supplementation at time of exam.  Patient expressed increasing urine output after Lasix initiated.  Objective: Vitals:   01/18/23 2032 01/19/23 0338 01/19/23 1148 01/19/23 1750  BP: 136/79 (!) 160/64 (!) 154/82   Pulse: 73 71 73   Resp: 20 16 (!) 22   Temp: 98.6 F (37 C) 98.4 F (36.9 C) 98.2 F (36.8 C)   TempSrc:   Oral   SpO2: 100% 99% 98% 97%  Weight:      Height:        Intake/Output Summary (Last 24 hours) at 01/19/2023 1847 Last data filed at 01/19/2023 0600 Gross per 24 hour  Intake 480 ml  Output 800 ml  Net -320 ml   Filed Weights   01/13/23 1214 01/13/23 1747  Weight: 53.8 kg 56 kg   Physical Exam General exam: Alert, awake, oriented x 3; overnight in the requiring up to 2 L oxygen supplementation, reports increased productive coughing spells, not feeling well and experiencing more dyspnea on exertion. Respiratory system: Diffuse  rhonchi bilaterally; tachypneic with activity, no using accessory muscles. Cardiovascular system: Rate controlled, no rubs, no gallops, positive murmur, metallic click appreciated on examination. Gastrointestinal system: Abdomen is nondistended, soft and nontender. No organomegaly or masses felt. Normal bowel sounds heard. Central nervous system: Alert and oriented. No focal neurological deficits. Extremities: No cyanosis or clubbing; trace edema appreciated bilaterally. Skin: No petechiae. Psychiatry: Judgement and insight appear normal. Mood & affect appropriate.   Data Reviewed: I have personally reviewed following labs and imaging studies  CBC: Recent Labs  Lab 01/13/23 1220 01/14/23 0358 01/15/23 0440 01/16/23 0850  WBC 13.0* 11.6* 9.6 5.2  NEUTROABS 10.7*  --   --   --   HGB 10.9* 9.3* 10.5* 9.4*  HCT 34.5* 29.7* 35.1* 30.8*  MCV 98.9 99.3 104.5* 100.0  PLT 157 149* 161 150   Basic  Metabolic Panel: Recent Labs  Lab 01/13/23 1220 01/14/23 0358 01/15/23 0440 01/16/23 0850 01/17/23 0401  NA 127* 133* 132* 131* 133*  K 3.8 3.5 4.0 4.1 4.3  CL 99 104 104 103 105  CO2 16* 19* 17* 18* 20*  GLUCOSE 207* 100* 132* 154* 144*  BUN 63* 61* 59* 59* 58*  CREATININE 2.73* 2.40* 2.31* 2.35* 2.32*  CALCIUM 8.7* 8.5* 8.5* 8.1* 8.2*  PHOS  --   --  4.8*  --  3.8   GFR: Estimated Creatinine Clearance: 13.4 mL/min (A) (by C-G formula based on SCr of 2.32 mg/dL (H)).  Liver Function Tests: Recent Labs  Lab 01/13/23 1220 01/15/23 0440 01/17/23 0401  AST 26  --   --   ALT 11  --   --   ALKPHOS 109  --   --   BILITOT 0.7  --   --   PROT 6.4*  --   --   ALBUMIN 3.4* 3.1* 2.9*   Radiology Studies: No results found.  Scheduled Meds:  allopurinol  100 mg Oral Daily   ALPRAZolam  0.5 mg Oral QHS   amLODipine  10 mg Oral Daily   atorvastatin  80 mg Oral q1800   cloNIDine  0.3 mg Transdermal Q Sat-1800   dextromethorphan-guaiFENesin  1 tablet Oral BID   doxycycline  100 mg Oral Q12H   famotidine  20 mg Oral Daily   fentaNYL  1 patch Transdermal Q72H   furosemide  20 mg Oral Daily   hydrALAZINE  100 mg Oral TID   insulin aspart  0-5 Units Subcutaneous QHS   insulin aspart  0-9 Units Subcutaneous TID WC   metoprolol succinate  25 mg Oral Daily   pantoprazole  40 mg Oral BID   sertraline  25 mg Oral Daily   tadalafil (PAH)  40 mg Oral Daily   warfarin  5 mg Oral q1600   Warfarin - Pharmacist Dosing Inpatient   Does not apply q1600   Continuous Infusions:  cefTRIAXone (ROCEPHIN)  IV 1 g (01/19/23 1225)     LOS: 6 days   Vassie Loll M.D on 01/19/2023 at 6:47 PM  Go to [email or web address] - for contact info  [facility] - Office  [phone]  If 7PM-7AM, please contact night-coverage [email or web address] 01/19/2023, 6:47 PM

## 2023-01-19 NOTE — Progress Notes (Signed)
Patient has rested well and has been stable.  Patient lung sounds sounded diminished, but patient still has congested and productive cough.  Oxygen stable on 2 liters.

## 2023-01-19 NOTE — Progress Notes (Signed)
ANTICOAGULATION CONSULT NOTE  Pharmacy Consult for warfarin Indication: atrial fibrillation/ mechanical valve  Allergies  Allergen Reactions   Augmentin [Amoxicillin-Pot Clavulanate] Diarrhea and Nausea And Vomiting   Ciprofloxacin Diarrhea, Nausea Only and Other (See Comments)    Stomach ache   Codeine     Insomnia, anxiety   Coreg [Carvedilol] Other (See Comments)    Terrible cramping in the feet and had a lot of bowel movements, but not diarrhea   Diflucan [Fluconazole] Diarrhea   Levofloxacin In D5w     Vomiting, diarrhea, insomnia   Losartan Swelling and Other (See Comments)    Patient doesn't recall site of swelling   Privigen [Immune Globulin (Human)] Other (See Comments)    Severe/excruciating pain   Sulfamethoxazole-Trimethoprim Diarrhea, Nausea Only and Other (See Comments)    Stomach upset   Verapamil Hives   Zetia [Ezetimibe] Other (See Comments)    Reaction not recalled   Zocor [Simvastatin - High Dose] Other (See Comments)    Reaction not recalled   Gatifloxacin Rash and Other (See Comments)    Redness to skin around eye    Patient Measurements: Height: 4\' 10"  (147.3 cm) Weight: 56 kg (123 lb 7.3 oz) IBW/kg (Calculated) : 40.9  Vital Signs: Temp: 98.4 F (36.9 C) (04/12 0338) BP: 160/64 (04/12 0338) Pulse Rate: 71 (04/12 0338)  Labs: Recent Labs    01/17/23 0401 01/18/23 0406 01/19/23 0402  LABPROT 35.0* 30.5* 32.2*  INR 3.5* 3.0* 3.2*  CREATININE 2.32*  --   --      Estimated Creatinine Clearance: 13.4 mL/min (A) (by C-G formula based on SCr of 2.32 mg/dL (H)).   Medical History: Past Medical History:  Diagnosis Date   Aortic atherosclerosis 01/23/2018   Atrial fibrillation    Benign positional vertigo 10/06/2011   Carotid artery disease    Chemotherapy induced neutropenia 07/21/2019   Cholelithiasis 01/23/2018   Cholelithiasis 01/23/2018   Chronic anticoagulation    Colovesical fistula, ruled out 04/14/2022   Coronary artery disease     status post coronary artery bypass grafting times 07/10/2004   Diabetes mellitus    Hypercholesteremia    Hypertension    Mechanical heart valve present    H. aortic valve replacement at the time of bypass surgery October 2005   Moderate to severe pulmonary hypertension    Peripheral arterial disease    history of left common iliac artery PTA and stenting for a chronic total occlusion 08/26/01   S/P cholecystectomy 03/06/2018    Assessment: [name] on warfarin for atrial fibrillation admitted for abdominal pain and AKI. Pharmacy consulted to continue warfarin during admission.  INR somewhat stable at upper end of goal (3.2) after 4mg  given last night. No overt bleeding issues noted.   PTA regimen 5mg  daily - last dose 4/5 PM  Patient taking 5mg  daily prior to admit.   Goal of Therapy:  INR goal 2.5-3.5 per anti-coag clinic note Monitor platelets by anticoagulation protocol: Yes   Plan:  Patient has been on 5mg  daily and closely followed by warfarin clinic for some time.  Would recommend continuing home at discharge with close follow up INR check, next week if possible.   Sheppard Coil PharmD., BCPS Clinical Pharmacist 01/19/2023 10:56 AM

## 2023-01-19 NOTE — Care Management Important Message (Addendum)
Important Message  Patient Details  Name: [name] MRN: [id number] Date of Birth: 09/10/1941   Medicare Important Message Given:  Yes By  phone at [phone]    Corey Harold 01/19/2023, 9:04 AM

## 2023-01-19 NOTE — Progress Notes (Signed)
ANTICOAGULATION CONSULT NOTE  Pharmacy Consult for warfarin Indication: atrial fibrillation/ mechanical valve  Allergies  Allergen Reactions   Augmentin [Amoxicillin-Pot Clavulanate] Diarrhea and Nausea And Vomiting   Ciprofloxacin Diarrhea, Nausea Only and Other (See Comments)    Stomach ache   Codeine     Insomnia, anxiety   Coreg [Carvedilol] Other (See Comments)    Terrible cramping in the feet and had a lot of bowel movements, but not diarrhea   Diflucan [Fluconazole] Diarrhea   Levofloxacin In D5w     Vomiting, diarrhea, insomnia   Losartan Swelling and Other (See Comments)    Patient doesn't recall site of swelling   Privigen [Immune Globulin (Human)] Other (See Comments)    Severe/excruciating pain   Sulfamethoxazole-Trimethoprim Diarrhea, Nausea Only and Other (See Comments)    Stomach upset   Verapamil Hives   Zetia [Ezetimibe] Other (See Comments)    Reaction not recalled   Zocor [Simvastatin - High Dose] Other (See Comments)    Reaction not recalled   Gatifloxacin Rash and Other (See Comments)    Redness to skin around eye    Patient Measurements: Height: 4\' 10"  (147.3 cm) Weight: 56 kg (123 lb 7.3 oz) IBW/kg (Calculated) : 40.9  Vital Signs: Temp: 98.2 F (36.8 C) (04/12 1148) Temp Source: Oral (04/12 1148) BP: 154/82 (04/12 1148) Pulse Rate: 73 (04/12 1148)  Labs: Recent Labs    01/17/23 0401 01/18/23 0406 01/19/23 0402  LABPROT 35.0* 30.5* 32.2*  INR 3.5* 3.0* 3.2*  CREATININE 2.32*  --   --      Estimated Creatinine Clearance: 13.4 mL/min (A) (by C-G formula based on SCr of 2.32 mg/dL (H)).   Medical History: Past Medical History:  Diagnosis Date   Aortic atherosclerosis 01/23/2018   Atrial fibrillation    Benign positional vertigo 10/06/2011   Carotid artery disease    Chemotherapy induced neutropenia 07/21/2019   Cholelithiasis 01/23/2018   Cholelithiasis 01/23/2018   Chronic anticoagulation    Colovesical fistula, ruled out  04/14/2022   Coronary artery disease    status post coronary artery bypass grafting times 07/10/2004   Diabetes mellitus    Hypercholesteremia    Hypertension    Mechanical heart valve present    H. aortic valve replacement at the time of bypass surgery October 2005   Moderate to severe pulmonary hypertension    Peripheral arterial disease    history of left common iliac artery PTA and stenting for a chronic total occlusion 08/26/01   S/P cholecystectomy 03/06/2018    Assessment: 82 YOF on warfarin for atrial fibrillation admitted for abdominal pain and AKI. Pharmacy consulted to continue warfarin during admission.  INR somewhat stable at upper end of goal (3.2) after 4mg  given last night. No overt bleeding issues noted.   PTA regimen 5mg  daily - last dose 4/5 PM  Patient taking 5mg  daily prior to admit.   Goal of Therapy:  INR goal 2.5-3.5 per anti-coag clinic note Monitor platelets by anticoagulation protocol: Yes   Plan:  Will give patient 4mg  of warfarin today then resume home dose at discharge  Patient has been on 5mg  daily and closely followed by warfarin clinic for some time.  Would recommend continuing home at discharge with close follow up INR check, next week if possible.   Sheppard Coil PharmD., BCPS Clinical Pharmacist 01/19/2023 4:11 PM

## 2023-01-20 DIAGNOSIS — N179 Acute kidney failure, unspecified: Secondary | ICD-10-CM | POA: Diagnosis not present

## 2023-01-20 DIAGNOSIS — I5032 Chronic diastolic (congestive) heart failure: Secondary | ICD-10-CM | POA: Diagnosis not present

## 2023-01-20 DIAGNOSIS — K529 Noninfective gastroenteritis and colitis, unspecified: Secondary | ICD-10-CM | POA: Diagnosis not present

## 2023-01-20 DIAGNOSIS — I482 Chronic atrial fibrillation, unspecified: Secondary | ICD-10-CM | POA: Diagnosis not present

## 2023-01-20 LAB — GLUCOSE, CAPILLARY
Glucose-Capillary: 113 mg/dL — ABNORMAL HIGH (ref 70–99)
Glucose-Capillary: 154 mg/dL — ABNORMAL HIGH (ref 70–99)
Glucose-Capillary: 102 mg/dL — ABNORMAL HIGH (ref 70–99)
Glucose-Capillary: 110 mg/dL — ABNORMAL HIGH (ref 70–99)

## 2023-01-20 LAB — BASIC METABOLIC PANEL
Anion gap: 5 (ref 5–15)
BUN: 41 mg/dL — ABNORMAL HIGH (ref 8–23)
CO2: 20 mmol/L — ABNORMAL LOW (ref 22–32)
Calcium: 8.6 mg/dL — ABNORMAL LOW (ref 8.9–10.3)
Chloride: 108 mmol/L (ref 98–111)
Creatinine, Ser: 1.61 mg/dL — ABNORMAL HIGH (ref 0.44–1.00)
GFR, Estimated: 31 mL/min — ABNORMAL LOW (ref >60)
Glucose, Bld: 89 mg/dL (ref 70–99)
Potassium: 4.1 mmol/L (ref 3.5–5.1)
Sodium: 133 mmol/L — ABNORMAL LOW (ref 135–145)

## 2023-01-20 LAB — PROTIME-INR
INR: 3.7 — ABNORMAL HIGH (ref 0.8–1.2)
Prothrombin Time: 36.4 seconds — ABNORMAL HIGH (ref 11.4–15.2)

## 2023-01-20 MED ORDER — ALPRAZOLAM 0.25 MG PO TABS
0.2500 mg | ORAL_TABLET | Freq: Two times a day (BID) | ORAL | Status: DC | PRN
  Administered 2023-01-20 – 2023-01-25 (×7): 0.25 mg via ORAL
  Filled 2023-01-20 (×8): qty 1

## 2023-01-20 MED ORDER — CEFDINIR 300 MG PO CAPS
300.0000 mg | ORAL_CAPSULE | Freq: Every day | ORAL | Status: DC
  Administered 2023-01-21: 300 mg via ORAL
  Filled 2023-01-20: qty 1

## 2023-01-20 MED ORDER — FUROSEMIDE 10 MG/ML IJ SOLN
20.0000 mg | Freq: Once | INTRAMUSCULAR | Status: AC
  Administered 2023-01-20: 20 mg via INTRAVENOUS
  Filled 2023-01-20: qty 2

## 2023-01-20 NOTE — Progress Notes (Signed)
Patient called out to the desk stating that she cannot breathe. I checked her oxygen sats and she was 98% on 3L. She sounds congested in her upper airway and I encourage her to cough but she does not. I asked her did she want respiratory and she did. Respiratory was called.

## 2023-01-20 NOTE — Progress Notes (Signed)
PROGRESS NOTE  [name], is a 82 y.o. female, DOB - [birth year], WUJ:[id number]  Admit date - 01/13/2023   Admitting Physician No admitting provider for patient encounter.  Outpatient Primary MD for the patient is Branch, [name], MD  LOS - 7  Chief Complaint  Patient presents with   Abdominal Pain      Brief Narrative:  82 y.o. 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 -Patient chronically on Lasix. -Baseline creatinine usually 1.5-1.7 -On admission on 01/13/2023 creatinine was up to 2.73 and BUN was up to 63 -Creatinine currently trending down and now close to baseline. -Continue to renally adjust medications, avoid nephrotoxic agents / dehydration  / hypotension -Continue close monitoring of renal function.  2) acute hypoxic respiratory failure--new on 01/16/2023 -On 01/16/2023 patient developed respiratory distress with hypoxia requiring oxygen chest x-ray suggesting left-sided pneumonia -Continue treatment with Rocephin and doxycycline as prescribed -Continue mucolytics and PRN bronchodilators  -Continue to wean off oxygen supplementation as tolerated; checking desaturation screening. -Hopefully discharge in the next 24-48 hours once stable.. -Continue supportive care and follow clinical response.  3) chronic anemia--multifactorial including underlying CKD -Patient with recent GI bleed -Patient required transfusion back in March 2024 -Hgb currently above 9 -Watch closely for any bleeding as patient is on chronic anticoagulation with Coumadin. -No overt bleeding appreciated-continue to follow  hemoglobin trend.  4)Diarrhea--recent antibiotic treatment for colitis,  -WBC 13.0 >> 11.6>>9.6> 5.2 -Stool for C. difficile and stool culture requested, but was not sent-patient's diarrhea appears to have resolved -Hold off on further antibiotic therapy for C. difficile. -If needed will add the use of Florastor. -Continue to maintain adequate hydration.  5)Chronic diastolic congestive heart failure, compensated -Concerns for dehydration due to GI losses -Resuming patient's Lasix to further assist with volume management and complaints of orthopnea. -Continue daily weights and strict intake and output.   6)CAD s/p CABG/PAD---chest pain-free at this time -- Continue atorvastatin and metoprolol -- Hold aspirin for concern of GI bleed  -Patient is already on Coumadin for A-fib and mechanical aortic valve   7)Pulmonary hypertension -c/n Tadalafil and outpatient follow-up with pulmonologist/cardiologist.   8)Anxiety/depression -- Zoloft 25 mg p.o. daily -- Xanax 0.5 mg p.o. nightly as needed anxiety -Stable mood.   9)Type 2 diabetes mellitus Hemoglobin A1c 5.6 on  12/24/2022--reflecting excellent diabetic control PTA Use Novolog/Humalog Sliding scale insulin with Accu-Cheks/Fingersticks as ordered    10)GERD with superimposed Acute GI bleed -- Protonix 40 mg bid  -- Pepcid 20 mg bid  11)Paroxysmal atrial fibrillation/Mechanical AVR on Coumadin -Continue metoprolol for rate control -Pharmacy to help manage Coumadin therapy  12)Social/ethics--- DNR/DNI,  -Multiple comorbid conditions impacting overall prognosis -ReCurrent hospitalizations Previously seen by palliative care team  13)HTN--continue clonidine patch, amlodipine and hydralazine as well as metoprolol for BP control -IV labetalol as needed elevated BP  14)HypoNatremia--- on admission sodium was 127  -Sodium is improving with hydration  15) chronic pain syndrome--continue Duragesic patch   Status is: Inpatient    Disposition: The patient is from: Home              Anticipated d/c is to: Home  Anticipated d/c date is: 1-2 days              Patient currently is not medically stable to d/c. Barriers: Not Clinically Stable-   Code Status :  -  Code Status: DNR   Family Communication:    NA (patient is alert, awake and coherent)   DVT Prophylaxis  :   - SCDs / Coumadin   Lab Results  Component Value Date   PLT 150 01/16/2023    Inpatient Medications  Scheduled Meds:  allopurinol  100 mg Oral Daily   amLODipine  10 mg Oral Daily   atorvastatin  80 mg Oral q1800   [START ON 01/21/2023] cefdinir  300 mg Oral Daily   cloNIDine  0.3 mg Transdermal Q Sat-1800   dextromethorphan-guaiFENesin  1 tablet Oral BID   doxycycline  100 mg Oral Q12H   famotidine  20 mg Oral Daily   fentaNYL  1 patch Transdermal Q72H   furosemide  20 mg Intravenous Once   furosemide  20 mg Oral Daily   hydrALAZINE  100 mg Oral TID   insulin aspart  0-5 Units Subcutaneous QHS   insulin aspart  0-9 Units Subcutaneous TID WC   metoprolol succinate  25 mg Oral Daily   pantoprazole  40 mg Oral BID   sertraline  25 mg Oral Daily   tadalafil (PAH)  40 mg Oral Daily   Warfarin - Pharmacist Dosing Inpatient   Does not apply q1600   Continuous Infusions:  PRN Meds:.acetaminophen, albuterol, ALPRAZolam, ipratropium-albuterol, labetalol, loperamide, ondansetron   Anti-infectives (From admission, onward)    Start     Dose/Rate Route Frequency Ordered Stop   01/21/23 1000  cefdinir (OMNICEF) capsule 300 mg        300 mg Oral Daily 01/20/23 1202     01/16/23 1115  cefTRIAXone (ROCEPHIN) 1 g in sodium chloride 0.9 % 100 mL IVPB  Status:  Discontinued        1 g 200 mL/hr over 30 Minutes Intravenous Every 24 hours 01/16/23 1027 01/20/23 1202   01/16/23 1115  doxycycline (VIBRA-TABS) tablet 100 mg        100 mg Oral Every 12 hours 01/16/23 1027     01/13/23 1800  azithromycin (ZITHROMAX) 500 mg in sodium  chloride 0.9 % 250 mL IVPB        500 mg 250 mL/hr over 60 Minutes Intravenous Every 24 hours 01/13/23 1711 01/14/23 1851         Subjective: [name] continues requiring 2 L nasal cannula supplementation; worsening productive coughing spells patient feeling more short of breath and expressing orthopnea.  Objective: Vitals:   01/20/23 0546 01/20/23 0853 01/20/23 1119 01/20/23 1453  BP: (!) 185/77  (!) 187/98 (!) 165/66  Pulse: 89  (!) 102 86  Resp: 20  18 19   Temp: 97.9 F (36.6 C)   98.4 F (36.9 C)  TempSrc: Oral   Oral  SpO2: 99% 99% 99% 99%  Weight:      Height:        Intake/Output Summary (Last 24 hours) at 01/20/2023 1737 Last data filed at 01/20/2023 1645 Gross per 24 hour  Intake 440 ml  Output 1000 ml  Net -560 ml   Filed Weights   01/13/23 1214 01/13/23 1747  Weight: 53.8 kg 56 kg   Physical Exam General exam: Alert, awake, oriented x 3; reports feeling bad, more short of breath expressing mild orthopnea and worsening productive coughing spells.  2  L nasal cannula in place. Respiratory system: Decreased breath sounds at the bases, positive rhonchi bilaterally; no using accessory muscles. Cardiovascular system: Rate controlled, no rubs, no gallops, positive murmur, positive metallic click appreciated on examination. Gastrointestinal system: Abdomen is nondistended, soft and nontender. No organomegaly or masses felt. Normal bowel sounds heard. Central nervous system: Alert and oriented. No focal neurological deficits. Extremities: No trace to 1+ edema bilaterally appreciated; no cyanosis or clubbing. Skin: No petechiae. Psychiatry: Judgement and insight appear normal. Mood & affect appropriate.   Data Reviewed: I have personally reviewed following labs and imaging studies  CBC: Recent Labs  Lab 01/14/23 0358 01/15/23 0440 01/16/23 0850  WBC 11.6* 9.6 5.2  HGB 9.3* 10.5* 9.4*  HCT 29.7* 35.1* 30.8*  MCV 99.3 104.5* 100.0  PLT 149* 161 150   Basic  Metabolic Panel: Recent Labs  Lab 01/14/23 0358 01/15/23 0440 01/16/23 0850 01/17/23 0401 01/20/23 0427  NA 133* 132* 131* 133* 133*  K 3.5 4.0 4.1 4.3 4.1  CL 104 104 103 105 108  CO2 19* 17* 18* 20* 20*  GLUCOSE 100* 132* 154* 144* 89  BUN 61* 59* 59* 58* 41*  CREATININE 2.40* 2.31* 2.35* 2.32* 1.61*  CALCIUM 8.5* 8.5* 8.1* 8.2* 8.6*  PHOS  --  4.8*  --  3.8  --    GFR: Estimated Creatinine Clearance: 19.3 mL/min (A) (by C-G formula based on SCr of 1.61 mg/dL (H)).  Liver Function Tests: Recent Labs  Lab 01/15/23 0440 01/17/23 0401  ALBUMIN 3.1* 2.9*   Radiology Studies: No results found.  Scheduled Meds:  allopurinol  100 mg Oral Daily   amLODipine  10 mg Oral Daily   atorvastatin  80 mg Oral q1800   [START ON 01/21/2023] cefdinir  300 mg Oral Daily   cloNIDine  0.3 mg Transdermal Q Sat-1800   dextromethorphan-guaiFENesin  1 tablet Oral BID   doxycycline  100 mg Oral Q12H   famotidine  20 mg Oral Daily   fentaNYL  1 patch Transdermal Q72H   furosemide  20 mg Intravenous Once   furosemide  20 mg Oral Daily   hydrALAZINE  100 mg Oral TID   insulin aspart  0-5 Units Subcutaneous QHS   insulin aspart  0-9 Units Subcutaneous TID WC   metoprolol succinate  25 mg Oral Daily   pantoprazole  40 mg Oral BID   sertraline  25 mg Oral Daily   tadalafil (PAH)  40 mg Oral Daily   Warfarin - Pharmacist Dosing Inpatient   Does not apply q1600   Continuous Infusions:    LOS: 7 days   Vassie Loll M.D on 01/20/2023 at 5:37 PM  Go to [email or web address] - for contact info  [facility] - Office  [phone]  If 7PM-7AM, please contact night-coverage [email or web address] 01/20/2023, 5:37 PM

## 2023-01-20 NOTE — Progress Notes (Addendum)
ANTICOAGULATION CONSULT NOTE  Pharmacy Consult for warfarin Indication: atrial fibrillation/ mechanical valve  Allergies  Allergen Reactions   Augmentin [Amoxicillin-Pot Clavulanate] Diarrhea and Nausea And Vomiting   Ciprofloxacin Diarrhea, Nausea Only and Other (See Comments)    Stomach ache   Codeine     Insomnia, anxiety   Coreg [Carvedilol] Other (See Comments)    Terrible cramping in the feet and had a lot of bowel movements, but not diarrhea   Diflucan [Fluconazole] Diarrhea   Levofloxacin In D5w     Vomiting, diarrhea, insomnia   Losartan Swelling and Other (See Comments)    Patient doesn't recall site of swelling   Privigen [Immune Globulin (Human)] Other (See Comments)    Severe/excruciating pain   Sulfamethoxazole-Trimethoprim Diarrhea, Nausea Only and Other (See Comments)    Stomach upset   Verapamil Hives   Zetia [Ezetimibe] Other (See Comments)    Reaction not recalled   Zocor [Simvastatin - High Dose] Other (See Comments)    Reaction not recalled   Gatifloxacin Rash and Other (See Comments)    Redness to skin around eye    Patient Measurements: Height: 4\' 10"  (147.3 cm) Weight: 56 kg (123 lb 7.3 oz) IBW/kg (Calculated) : 40.9  Vital Signs: Temp: 97.9 F (36.6 C) (04/13 0546) Temp Source: Oral (04/13 0546) BP: 185/77 (04/13 0546) Pulse Rate: 89 (04/13 0546)  Labs: Recent Labs    01/18/23 0406 01/19/23 0402 01/20/23 0427  LABPROT 30.5* 32.2* 36.4*  INR 3.0* 3.2* 3.7*  CREATININE  --   --  1.61*     Estimated Creatinine Clearance: 19.3 mL/min (A) (by C-G formula based on SCr of 1.61 mg/dL (H)).   Medical History: Past Medical History:  Diagnosis Date   Aortic atherosclerosis 01/23/2018   Atrial fibrillation    Benign positional vertigo 10/06/2011   Carotid artery disease    Chemotherapy induced neutropenia 07/21/2019   Cholelithiasis 01/23/2018   Cholelithiasis 01/23/2018   Chronic anticoagulation    Colovesical fistula, ruled out  04/14/2022   Coronary artery disease    status post coronary artery bypass grafting times 07/10/2004   Diabetes mellitus    Hypercholesteremia    Hypertension    Mechanical heart valve present    H. aortic valve replacement at the time of bypass surgery October 2005   Moderate to severe pulmonary hypertension    Peripheral arterial disease    history of left common iliac artery PTA and stenting for a chronic total occlusion 08/26/01   S/P cholecystectomy 03/06/2018    Assessment: 82 YOF on warfarin for atrial fibrillation admitted for abdominal pain and AKI. Pharmacy consulted to continue warfarin during admission.  INR labile now above goal after lower dose last night. INR up to 3.7. No overt bleeding issues noted. I expect after patient is off antibiotics(doxy) her INR will stabilize.   PTA regimen 5mg  daily - last dose 4/5 PM  Patient taking 5mg  daily prior to admit.   Goal of Therapy:  INR goal 2.5-3.5 per anti-coag clinic note Monitor platelets by anticoagulation protocol: Yes   Plan:  Will unfortunately have to hold warfarin today given elevated INR.   If discharged home today, my recommendation is to hold today and 2.5mg  on Sunday 4/14 then 2.5mg  on Monday's and Friday's and 5mg  all other days will INR recheck when ever possible next week  Sheppard Coil PharmD., BCPS Clinical Pharmacist 01/20/2023 8:38 AM

## 2023-01-21 ENCOUNTER — Inpatient Hospital Stay (HOSPITAL_COMMUNITY): Payer: Medicare HMO

## 2023-01-21 DIAGNOSIS — K529 Noninfective gastroenteritis and colitis, unspecified: Secondary | ICD-10-CM | POA: Diagnosis not present

## 2023-01-21 DIAGNOSIS — N179 Acute kidney failure, unspecified: Secondary | ICD-10-CM | POA: Diagnosis not present

## 2023-01-21 DIAGNOSIS — I5032 Chronic diastolic (congestive) heart failure: Secondary | ICD-10-CM | POA: Diagnosis not present

## 2023-01-21 DIAGNOSIS — I482 Chronic atrial fibrillation, unspecified: Secondary | ICD-10-CM | POA: Diagnosis not present

## 2023-01-21 LAB — BASIC METABOLIC PANEL
Anion gap: 9 (ref 5–15)
BUN: 39 mg/dL — ABNORMAL HIGH (ref 8–23)
CO2: 21 mmol/L — ABNORMAL LOW (ref 22–32)
Calcium: 8.9 mg/dL (ref 8.9–10.3)
Chloride: 107 mmol/L (ref 98–111)
Creatinine, Ser: 1.56 mg/dL — ABNORMAL HIGH (ref 0.44–1.00)
GFR, Estimated: 33 mL/min — ABNORMAL LOW (ref >60)
Glucose, Bld: 98 mg/dL (ref 70–99)
Potassium: 4.7 mmol/L (ref 3.5–5.1)
Sodium: 137 mmol/L (ref 135–145)

## 2023-01-21 LAB — CBC
HCT: 33.3 % — ABNORMAL LOW (ref 36.0–46.0)
Hemoglobin: 10.1 g/dL — ABNORMAL LOW (ref 12.0–15.0)
MCH: 30.3 pg (ref 26.0–34.0)
MCHC: 30.3 g/dL (ref 30.0–36.0)
MCV: 100 fL (ref 80.0–100.0)
Platelets: 150 10*3/uL (ref 150–400)
RBC: 3.33 MIL/uL — ABNORMAL LOW (ref 3.87–5.11)
RDW: 15.7 % — ABNORMAL HIGH (ref 11.5–15.5)
WBC: 4.7 10*3/uL (ref 4.0–10.5)
nRBC: 0 % (ref 0.0–0.2)

## 2023-01-21 LAB — GLUCOSE, CAPILLARY
Glucose-Capillary: 105 mg/dL — ABNORMAL HIGH (ref 70–99)
Glucose-Capillary: 136 mg/dL — ABNORMAL HIGH (ref 70–99)
Glucose-Capillary: 134 mg/dL — ABNORMAL HIGH (ref 70–99)
Glucose-Capillary: 116 mg/dL — ABNORMAL HIGH (ref 70–99)

## 2023-01-21 LAB — PROTIME-INR
INR: 4.5 (ref 0.8–1.2)
Prothrombin Time: 42.1 seconds — ABNORMAL HIGH (ref 11.4–15.2)

## 2023-01-21 MED ORDER — METRONIDAZOLE 500 MG PO TABS
500.0000 mg | ORAL_TABLET | Freq: Two times a day (BID) | ORAL | Status: DC
  Administered 2023-01-21 – 2023-01-24 (×7): 500 mg via ORAL
  Filled 2023-01-21 (×9): qty 1

## 2023-01-21 MED ORDER — FUROSEMIDE 40 MG PO TABS
40.0000 mg | ORAL_TABLET | Freq: Every day | ORAL | Status: DC
  Administered 2023-01-21 – 2023-01-26 (×6): 40 mg via ORAL
  Filled 2023-01-21 (×6): qty 1

## 2023-01-21 MED ORDER — FUROSEMIDE 10 MG/ML IJ SOLN
20.0000 mg | Freq: Once | INTRAMUSCULAR | Status: AC
  Administered 2023-01-21: 20 mg via INTRAVENOUS
  Filled 2023-01-21: qty 2

## 2023-01-21 MED ORDER — BUDESONIDE 0.5 MG/2ML IN SUSP
0.5000 mg | Freq: Two times a day (BID) | RESPIRATORY_TRACT | Status: DC
  Administered 2023-01-21 – 2023-01-26 (×11): 0.5 mg via RESPIRATORY_TRACT
  Filled 2023-01-21 (×11): qty 2

## 2023-01-21 MED ORDER — CEFDINIR 300 MG PO CAPS
300.0000 mg | ORAL_CAPSULE | Freq: Two times a day (BID) | ORAL | Status: DC
  Administered 2023-01-21 – 2023-01-24 (×7): 300 mg via ORAL
  Filled 2023-01-21 (×8): qty 1

## 2023-01-21 NOTE — Progress Notes (Signed)
Continues to have rhonchi and wheezing with congested ,mostly nonproductive cough.  Dr. Gwenlyn Perking ordered repeat cxr and additional nebs and lasix.  Contacted RT for vibrating vest.  Was up in chair today and encouraged to use her flutter valve.

## 2023-01-21 NOTE — Progress Notes (Signed)
PROGRESS NOTE  [name], is a 82 y.o. female, DOB - 1941-08-25, ZES:[id number]  Admit date - 01/13/2023   Admitting Physician No admitting provider for patient encounter.  Outpatient Primary MD for the patient is Branch, [name], MD  LOS - 8  Chief Complaint  Patient presents with   Abdominal Pain      Brief Narrative:  82 y.o. 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 -Patient chronically on Lasix. -Baseline creatinine usually 1.5-1.7 -On admission on 01/13/2023 creatinine was up to 2.73 and BUN was up to 63 -Creatinine currently back to baseline at 1.5. -Continue to renally adjust medications, avoid nephrotoxic agents / dehydration  / hypotension -Continue to follow renal function trend.  2) acute hypoxic respiratory failure--new on 01/16/2023 -On 01/16/2023 patient developed respiratory distress with hypoxia requiring oxygen chest x-ray suggesting left-sided pneumonia -Continue treatment with antibiotics, given findings of multifocal pneumonia continue cefdinir and doxycycline and will add Flagyl. -Continue mucolytics and PRN bronchodilators; started on Pulmicort -Continue to wean off oxygen supplementation as tolerated; checking desaturation screening. -Continue to wean off oxygen supplementation as tolerated  3) chronic anemia--multifactorial including underlying CKD -Patient with recent GI bleed -Patient required transfusion back in March 2024 -Hgb currently above 9 -Watch closely for any bleeding as patient is on chronic anticoagulation with Coumadin. -No overt bleeding appreciated-continue to  follow hemoglobin trend.  4)Diarrhea--recent antibiotic treatment for colitis,  -WBC 13.0 >> 11.6>>9.6> 5.2 -Stool for C. difficile and stool culture requested, but was not sent-patient's diarrhea appears to have resolved -Hold off on further antibiotic therapy for C. difficile. -If needed will add the use of Florastor. -Continue to maintain adequate hydration and follow clinical course.[name]  5)Chronic diastolic congestive heart failure, compensated -Concerns for dehydration due to GI losses -Patient received judicious fluid resuscitation and initially Lasix were held. -Lasix at home has been resumed; renal function stable and tolerating diuretics. -IV Lasix x 1 dose will be given today. -Chest x-ray demonstrating vascular congestion and multifocal pneumonia.   6)CAD s/p CABG/PAD---chest pain-free at this time -- Continue atorvastatin and metoprolol -- Hold aspirin for concern of GI bleed  -Patient is already on Coumadin for A-fib and mechanical aortic valve   7)Pulmonary hypertension -c/n Tadalafil and outpatient follow-up with pulmonologist/cardiologist.   8)Anxiety/depression -- Zoloft 25 mg p.o. daily -- Xanax 0.5 mg p.o. nightly as needed anxiety -Stable mood.   9)Type 2 diabetes mellitus Hemoglobin A1c 5.6 on  12/24/2022--reflecting excellent diabetic control PTA Use Novolog/Humalog Sliding scale insulin with Accu-Cheks/Fingersticks as ordered    10)GERD with superimposed Acute GI bleed -- Protonix 40 mg bid  -- Pepcid 20 mg bid  11)Paroxysmal atrial fibrillation/Mechanical AVR on Coumadin -Continue metoprolol for rate control -Pharmacy to help manage Coumadin therapy  12)Social/ethics--- DNR/DNI,  -Multiple comorbid conditions impacting overall prognosis -ReCurrent hospitalizations Previously seen by palliative care team  13)HTN--continue clonidine patch, amlodipine and hydralazine as well as metoprolol for BP control -IV labetalol as needed elevated  BP  14)HypoNatremia--- on admission sodium was 127  -Sodium is improving with hydration  15) chronic pain syndrome--continue Duragesic patch   Status is: Inpatient   Disposition: The patient is from: Home  Anticipated d/c is to: Home              Anticipated d/c date is: 1-2 days              Patient currently is not medically stable to d/c. Barriers: Not Clinically Stable-   Code Status :  -  Code Status: DNR   Family Communication:    NA (patient is alert, awake and coherent)   DVT Prophylaxis  :   - SCDs / Coumadin   Lab Results  Component Value Date   PLT 150 01/21/2023    Inpatient Medications  Scheduled Meds:  allopurinol  100 mg Oral Daily   amLODipine  10 mg Oral Daily   atorvastatin  80 mg Oral q1800   budesonide (PULMICORT) nebulizer solution  0.5 mg Nebulization BID   cefdinir  300 mg Oral Q12H   cloNIDine  0.3 mg Transdermal Q Sat-1800   dextromethorphan-guaiFENesin  1 tablet Oral BID   doxycycline  100 mg Oral Q12H   famotidine  20 mg Oral Daily   fentaNYL  1 patch Transdermal Q72H   furosemide  20 mg Intravenous Once   furosemide  40 mg Oral Daily   hydrALAZINE  100 mg Oral TID   insulin aspart  0-5 Units Subcutaneous QHS   insulin aspart  0-9 Units Subcutaneous TID WC   metoprolol succinate  25 mg Oral Daily   metroNIDAZOLE  500 mg Oral Q12H   pantoprazole  40 mg Oral BID   sertraline  25 mg Oral Daily   tadalafil (PAH)  40 mg Oral Daily   Warfarin - Pharmacist Dosing Inpatient   Does not apply q1600   Continuous Infusions:  PRN Meds:.acetaminophen, albuterol, ALPRAZolam, ipratropium-albuterol, labetalol, loperamide, ondansetron   Anti-infectives (From admission, onward)    Start     Dose/Rate Route Frequency Ordered Stop   01/21/23 2200  cefdinir (OMNICEF) capsule 300 mg        300 mg Oral Every 12 hours 01/21/23 1331     01/21/23 1430  metroNIDAZOLE (FLAGYL) tablet 500 mg        500 mg Oral Every 12 hours 01/21/23 1331      01/21/23 1000  cefdinir (OMNICEF) capsule 300 mg  Status:  Discontinued        300 mg Oral Daily 01/20/23 1202 01/21/23 1331   01/16/23 1115  cefTRIAXone (ROCEPHIN) 1 g in sodium chloride 0.9 % 100 mL IVPB  Status:  Discontinued        1 g 200 mL/hr over 30 Minutes Intravenous Every 24 hours 01/16/23 1027 01/20/23 1202   01/16/23 1115  doxycycline (VIBRA-TABS) tablet 100 mg        100 mg Oral Every 12 hours 01/16/23 1027     01/13/23 1800  azithromycin (ZITHROMAX) 500 mg in sodium chloride 0.9 % 250 mL IVPB        500 mg 250 mL/hr over 60 Minutes Intravenous Every 24 hours 01/13/23 1711 01/14/23 1851        Subjective: [name] continue requiring 2 L nasal cannula supplementation, no feeling well and expressing dyspnea on exertion.  Urine output has improved.  Still complaining of intermittent productive coughing spells.  Objective: Vitals:   01/20/23 2142 01/21/23 0126 01/21/23 0552 01/21/23 0900  BP: (!) 174/48  (!) 179/50   Pulse: 88  91   Resp: 20  20 (!) 28  Temp: 98.3 F (36.8 C)  98.4 F (36.9 C)   TempSrc: Oral  Oral   SpO2: 100% 97%    Weight:      Height:        Intake/Output Summary (Last 24 hours) at 01/21/2023 1332 Last data filed at 01/21/2023 0553 Gross per 24 hour  Intake --  Output 1000 ml  Net -1000 ml   Filed Weights   01/13/23 1214 01/13/23 1747  Weight: 53.8 kg 56 kg   Physical Exam General exam: Alert, awake, oriented x 3; reporting general malaise, increased in exertion and productive coughing spells.  2 L nasal cannula supplementation in place.  No fever Respiratory system: Diffuse rhonchi bilaterally; decreased breath sounds at the bases, no wheezing. Cardiovascular system: Rate controlled, no rubs, no gallops, positive metallic click appreciated on exam. Gastrointestinal system: Abdomen is nondistended, soft and nontender. No organomegaly or masses felt. Normal bowel sounds heard. Central nervous system: Alert and oriented. No focal  neurological deficits. Extremities: No cyanosis or clubbing; trace edema appreciated bilaterally. Skin: No petechiae. Psychiatry: Judgement and insight appear normal. Mood & affect appropriate.   Data Reviewed: I have personally reviewed following labs and imaging studies  CBC: Recent Labs  Lab 01/15/23 0440 01/16/23 0850 01/21/23 0329  WBC 9.6 5.2 4.7  HGB 10.5* 9.4* 10.1*  HCT 35.1* 30.8* 33.3*  MCV 104.5* 100.0 100.0  PLT 161 150 150   Basic Metabolic Panel: Recent Labs  Lab 01/15/23 0440 01/16/23 0850 01/17/23 0401 01/20/23 0427 01/21/23 0329  NA 132* 131* 133* 133* 137  K 4.0 4.1 4.3 4.1 4.7  CL 104 103 105 108 107  CO2 17* 18* 20* 20* 21*  GLUCOSE 132* 154* 144* 89 98  BUN 59* 59* 58* 41* 39*  CREATININE 2.31* 2.35* 2.32* 1.61* 1.56*  CALCIUM 8.5* 8.1* 8.2* 8.6* 8.9  PHOS 4.8*  --  3.8  --   --    GFR: Estimated Creatinine Clearance: 19.9 mL/min (A) (by C-G formula based on SCr of 1.56 mg/dL (H)).  Liver Function Tests: Recent Labs  Lab 01/15/23 0440 01/17/23 0401  ALBUMIN 3.1* 2.9*   Radiology Studies: DG Chest 2 View  Result Date: 01/21/2023 CLINICAL DATA:  Shortness of breath EXAM: CHEST - 2 VIEW COMPARISON:  Chest x-rays dated 01/16/2023 and 12/24/2022. FINDINGS: Stable cardiomegaly. Increased opacity at the LEFT lung base. Increased opacity at the RIGHT lung base. Probable small bilateral pleural effusions. No pneumothorax is seen. Median sternotomy wires appear intact and stable in alignment. No acute-appearing osseous abnormality. IMPRESSION: 1. Increased opacity at both lung bases, LEFT greater than RIGHT, suspicious for multifocal pneumonia. 2. Probable small bilateral pleural effusions. 3. Stable cardiomegaly. Electronically Signed   By: Bary Richard M.D.   On: 01/21/2023 12:12    Scheduled Meds:  allopurinol  100 mg Oral Daily   amLODipine  10 mg Oral Daily   atorvastatin  80 mg Oral q1800   budesonide (PULMICORT) nebulizer solution  0.5 mg  Nebulization BID   cefdinir  300 mg Oral Q12H   cloNIDine  0.3 mg Transdermal Q Sat-1800   dextromethorphan-guaiFENesin  1 tablet Oral BID   doxycycline  100 mg Oral Q12H   famotidine  20 mg Oral Daily   fentaNYL  1 patch Transdermal Q72H   furosemide  20 mg Intravenous Once   furosemide  40 mg Oral Daily   hydrALAZINE  100 mg Oral TID   insulin aspart  0-5 Units Subcutaneous QHS   insulin aspart  0-9 Units Subcutaneous TID WC   metoprolol succinate  25 mg Oral Daily  metroNIDAZOLE  500 mg Oral Q12H   pantoprazole  40 mg Oral BID   sertraline  25 mg Oral Daily   tadalafil (PAH)  40 mg Oral Daily   Warfarin - Pharmacist Dosing Inpatient   Does not apply q1600   Continuous Infusions:    LOS: 8 days   Vassie Loll M.D on 01/21/2023 at 1:32 PM  Go to [email or web address] - for contact info  [facility] - Office  [phone]  If 7PM-7AM, please contact night-coverage [email or web address] 01/21/2023, 1:32 PM

## 2023-01-21 NOTE — Progress Notes (Signed)
ANTICOAGULATION CONSULT NOTE  Pharmacy Consult for warfarin Indication: atrial fibrillation/ mechanical valve  Allergies  Allergen Reactions   Augmentin [Amoxicillin-Pot Clavulanate] Diarrhea and Nausea And Vomiting   Ciprofloxacin Diarrhea, Nausea Only and Other (See Comments)    Stomach ache   Codeine     Insomnia, anxiety   Coreg [Carvedilol] Other (See Comments)    Terrible cramping in the feet and had a lot of bowel movements, but not diarrhea   Diflucan [Fluconazole] Diarrhea   Levofloxacin In D5w     Vomiting, diarrhea, insomnia   Losartan Swelling and Other (See Comments)    Patient doesn't recall site of swelling   Privigen [Immune Globulin (Human)] Other (See Comments)    Severe/excruciating pain   Sulfamethoxazole-Trimethoprim Diarrhea, Nausea Only and Other (See Comments)    Stomach upset   Verapamil Hives   Zetia [Ezetimibe] Other (See Comments)    Reaction not recalled   Zocor [Simvastatin - High Dose] Other (See Comments)    Reaction not recalled   Gatifloxacin Rash and Other (See Comments)    Redness to skin around eye    Patient Measurements: Height: 4\' 10"  (147.3 cm) Weight: 56 kg (123 lb 7.3 oz) IBW/kg (Calculated) : 40.9  Vital Signs: Temp: 98.4 F (36.9 C) (04/14 0552) Temp Source: Oral (04/14 0552) BP: 179/50 (04/14 0552) Pulse Rate: 91 (04/14 0552)  Labs: Recent Labs    01/19/23 0402 01/20/23 0427 01/21/23 0329  HGB  --   --  10.1*  HCT  --   --  33.3*  PLT  --   --  150  LABPROT 32.2* 36.4* 42.1*  INR 3.2* 3.7* 4.5*  CREATININE  --  1.61* 1.56*     Estimated Creatinine Clearance: 19.9 mL/min (A) (by C-G formula based on SCr of 1.56 mg/dL (H)).   Medical History: Past Medical History:  Diagnosis Date   Aortic atherosclerosis 01/23/2018   Atrial fibrillation    Benign positional vertigo 10/06/2011   Carotid artery disease    Chemotherapy induced neutropenia 07/21/2019   Cholelithiasis 01/23/2018   Cholelithiasis 01/23/2018    Chronic anticoagulation    Colovesical fistula, ruled out 04/14/2022   Coronary artery disease    status post coronary artery bypass grafting times 07/10/2004   Diabetes mellitus    Hypercholesteremia    Hypertension    Mechanical heart valve present    H. aortic valve replacement at the time of bypass surgery October 2005   Moderate to severe pulmonary hypertension    Peripheral arterial disease    history of left common iliac artery PTA and stenting for a chronic total occlusion 08/26/01   S/P cholecystectomy 03/06/2018    Assessment: 82 YOF on warfarin for atrial fibrillation admitted for abdominal pain and AKI. Pharmacy consulted to continue warfarin during admission.  INR labile now above goal after lower dose last 4/12. INR up to 4.5 today. No overt bleeding issues noted. I expect after patient is off antibiotics(doxy) her INR will stabilize.For now patient continues on doxy and cefdinir. Patient diet has also appeared to have dropped off.   PTA regimen 5mg  daily - last dose 4/5 PM  Patient taking 5mg  daily prior to admit.   Goal of Therapy:  INR goal 2.5-3.5 per anti-coag clinic note Monitor platelets by anticoagulation protocol: Yes   Plan:  Will unfortunately have to hold warfarin today given continued rise in INR.  No immediate need for INR reversal at this time especially given mechanical valve - could consider giving  a few boost supplements that contain a fair amount of vitamin k for a slow progressive reversal.   Will likely need to hold dosing for a couple days and decrease weekly dosing regimen at discharge.   Sheppard Coil PharmD., BCPS Clinical Pharmacist 01/21/2023 7:45 AM

## 2023-01-21 NOTE — TOC Progression Note (Signed)
Transition of Care [facility]) - Progression Note    Patient Details  Name: [name] MRN: [id number] Date of Birth: April 25, 1941  Transition of [facility]) CM/SW Contact  Catalina Gravel, [place] Phone Number: 01/21/2023, 3:11 PM  Clinical Narrative:    Pt has continued medical needs per MD and not stable for DC. DC possible 1-2 days.   Expected Discharge Plan: Home w Home Health Services Barriers to Discharge: Continued Medical Work up  Expected Discharge Plan and Services In-house Referral: Clinical Social Work   Post Acute Care Choice: Resumption of Svcs/PTA Provider Living arrangements for the past 2 months: Single Family Home                                       Social Determinants of Health (SDOH) Interventions SDOH Screenings   Food Insecurity: No Food Insecurity (12/25/2022)  Housing: Low Risk  (12/25/2022)  Transportation Needs: No Transportation Needs (12/25/2022)  Utilities: Not At Risk (12/25/2022)  Alcohol Screen: Low Risk  (02/13/2022)  Depression (PHQ2-9): Low Risk  (02/13/2022)  Financial Resource Strain: Low Risk  (02/28/2022)  Physical Activity: Inactive (02/28/2022)  Social Connections: Moderately Integrated (06/22/2022)  Stress: No Stress Concern Present (02/28/2022)  Tobacco Use: Medium Risk (01/13/2023)    Readmission Risk Interventions    01/14/2023   12:20 PM 12/25/2022    9:36 AM 08/30/2022   11:42 AM  Readmission Risk Prevention Plan  Transportation Screening Complete Complete Complete  Medication Review Oceanographer) Complete Complete Complete  HRI or Home Care Consult Complete Complete Complete  SW Recovery Care/Counseling Consult Complete Complete Complete  Palliative Care Screening Not Applicable Not Applicable Not Applicable  Skilled Nursing Facility Not Applicable Not Applicable Not Applicable

## 2023-01-21 NOTE — Progress Notes (Signed)
   01/21/23 0900  Assess: MEWS Score  Resp (!) 28  O2 Flow Rate (L/min) 3 L/min  Assess: MEWS Score  MEWS Temp 0  MEWS Systolic 0  MEWS Pulse 0  MEWS RR 2  MEWS LOC 0  MEWS Score 2  MEWS Score Color Yellow  Assess: if the MEWS score is Yellow or Red  Were vital signs taken at a resting state? Yes  Does the patient meet 2 or more of the SIRS criteria? No  Provider Notification  Provider Name/Title Gwenlyn Perking  Date Provider Notified 01/21/23  Time Provider Notified 0900  Method of Notification Page  Notification Reason  (congested cough increased resp)  Provider response See new orders  Date of Provider Response 01/21/23  Assess: SIRS CRITERIA  SIRS Temperature  0  SIRS Pulse 1  SIRS Respirations  1  SIRS WBC 0  SIRS Score Sum  2

## 2023-01-22 DIAGNOSIS — K529 Noninfective gastroenteritis and colitis, unspecified: Secondary | ICD-10-CM | POA: Diagnosis not present

## 2023-01-22 DIAGNOSIS — N179 Acute kidney failure, unspecified: Secondary | ICD-10-CM | POA: Diagnosis not present

## 2023-01-22 DIAGNOSIS — I482 Chronic atrial fibrillation, unspecified: Secondary | ICD-10-CM | POA: Diagnosis not present

## 2023-01-22 DIAGNOSIS — I5032 Chronic diastolic (congestive) heart failure: Secondary | ICD-10-CM | POA: Diagnosis not present

## 2023-01-22 LAB — GLUCOSE, CAPILLARY
Glucose-Capillary: 103 mg/dL — ABNORMAL HIGH (ref 70–99)
Glucose-Capillary: 145 mg/dL — ABNORMAL HIGH (ref 70–99)
Glucose-Capillary: 88 mg/dL (ref 70–99)

## 2023-01-22 LAB — PROTIME-INR
INR: 3.9 — ABNORMAL HIGH (ref 0.8–1.2)
Prothrombin Time: 37.7 seconds — ABNORMAL HIGH (ref 11.4–15.2)

## 2023-01-22 NOTE — Progress Notes (Signed)
PROGRESS NOTE  [name], is a 82 y.o. female, DOB - 04/13/1941, ZOX:[id number]  Admit date - 01/13/2023   Admitting Physician No admitting provider for patient encounter.  Outpatient Primary MD for the patient is Branch, [name], MD  LOS - 9  Chief Complaint  Patient presents with   Abdominal Pain      Brief Narrative:  82 y.o. 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 -Patient chronically on Lasix. -Baseline creatinine usually 1.5-1.7 -On admission on 01/13/2023 creatinine was up to 2.73 and BUN was up to 63 -Creatinine currently back to baseline at 1.5. -Continue to renally adjust medications, avoid nephrotoxic agents / dehydration  / hypotension -Continue to follow renal function trend.  2) acute hypoxic respiratory failure--new on 01/16/2023 -On 01/16/2023 patient developed respiratory distress with hypoxia requiring oxygen chest x-ray suggesting left-sided pneumonia -Continue treatment with antibiotics, given findings of multifocal pneumonia continue cefdinir and doxycycline and will add Flagyl. -Continue mucolytics and PRN bronchodilators; started on Pulmicort -Continue to wean off oxygen supplementation as tolerated; checking desaturation screening. -Continue to wean off oxygen supplementation as tolerated  3) chronic anemia--multifactorial including underlying CKD -Patient with recent GI bleed -Patient required transfusion back in March 2024 -Hgb currently above 9 -Watch closely for any bleeding as patient is on chronic anticoagulation with Coumadin. -No overt bleeding appreciated-continue to  follow hemoglobin trend.  4)Diarrhea--recent antibiotic treatment for colitis,  -WBC 13.0 >> 11.6>>9.6> 5.2 -Stool for C. difficile and stool culture requested, but was not sent-patient's diarrhea appears to have resolved -Hold off on further antibiotic therapy for C. difficile. -If needed will add the use of Florastor. -Continue to maintain adequate hydration and follow clinical course.[name]  5)Chronic diastolic congestive heart failure, compensated -Concerns for dehydration due to GI losses -Patient received judicious fluid resuscitation and initially Lasix were held. -Lasix at home has been resumed; renal function stable and tolerating diuretics. -IV Lasix x 1 dose will be given today. -Chest x-ray demonstrating vascular congestion and multifocal pneumonia.   6)CAD s/p CABG/PAD---chest pain-free at this time -- Continue atorvastatin and metoprolol -- Hold aspirin for concern of GI bleed  -Patient is already on Coumadin for A-fib and mechanical aortic valve   7)Pulmonary hypertension -c/n Tadalafil and outpatient follow-up with pulmonologist/cardiologist.   8)Anxiety/depression -- Zoloft 25 mg p.o. daily -- Xanax 0.5 mg p.o. nightly as needed anxiety -Stable mood.   9)Type 2 diabetes mellitus Hemoglobin A1c 5.6 on  12/24/2022--reflecting excellent diabetic control PTA Use Novolog/Humalog Sliding scale insulin with Accu-Cheks/Fingersticks as ordered    10)GERD with superimposed Acute GI bleed -- Protonix 40 mg bid  -- Pepcid 20 mg bid  11)Paroxysmal atrial fibrillation/Mechanical AVR on Coumadin -Continue metoprolol for rate control -Pharmacy to help manage Coumadin therapy  12)Social/ethics--- DNR/DNI,  -Multiple comorbid conditions impacting overall prognosis -ReCurrent hospitalizations Previously seen by palliative care team  13)HTN--continue clonidine patch, amlodipine and hydralazine as well as metoprolol for BP control -IV labetalol as needed elevated  BP  14)HypoNatremia--- on admission sodium was 127  -Sodium is improving with hydration  15) chronic pain syndrome--continue Duragesic patch   Status is: Inpatient   Disposition: The patient is from: Home  Anticipated d/c is to: Home              Anticipated d/c date is: 1-2 days              Patient currently is not medically stable to d/c. Barriers: Not Clinically Stable-   Code Status :  -  Code Status: DNR   Family Communication:    NA (patient is alert, awake and coherent)   DVT Prophylaxis  :   - SCDs / Coumadin   Lab Results  Component Value Date   PLT 150 01/21/2023    Inpatient Medications  Scheduled Meds:  allopurinol  100 mg Oral Daily   amLODipine  10 mg Oral Daily   atorvastatin  80 mg Oral q1800   budesonide (PULMICORT) nebulizer solution  0.5 mg Nebulization BID   cefdinir  300 mg Oral Q12H   cloNIDine  0.3 mg Transdermal Q Sat-1800   dextromethorphan-guaiFENesin  1 tablet Oral BID   doxycycline  100 mg Oral Q12H   famotidine  20 mg Oral Daily   fentaNYL  1 patch Transdermal Q72H   furosemide  40 mg Oral Daily   hydrALAZINE  100 mg Oral TID   insulin aspart  0-5 Units Subcutaneous QHS   insulin aspart  0-9 Units Subcutaneous TID WC   metoprolol succinate  25 mg Oral Daily   metroNIDAZOLE  500 mg Oral Q12H   pantoprazole  40 mg Oral BID   sertraline  25 mg Oral Daily   tadalafil (PAH)  40 mg Oral Daily   Warfarin - Pharmacist Dosing Inpatient   Does not apply q1600   Continuous Infusions:  PRN Meds:.acetaminophen, albuterol, ALPRAZolam, ipratropium-albuterol, labetalol, loperamide, ondansetron   Anti-infectives (From admission, onward)    Start     Dose/Rate Route Frequency Ordered Stop   01/21/23 2200  cefdinir (OMNICEF) capsule 300 mg        300 mg Oral Every 12 hours 01/21/23 1331     01/21/23 1430  metroNIDAZOLE (FLAGYL) tablet 500 mg        500 mg Oral Every 12 hours 01/21/23 1331     01/21/23 1000  cefdinir (OMNICEF)  capsule 300 mg  Status:  Discontinued        300 mg Oral Daily 01/20/23 1202 01/21/23 1331   01/16/23 1115  cefTRIAXone (ROCEPHIN) 1 g in sodium chloride 0.9 % 100 mL IVPB  Status:  Discontinued        1 g 200 mL/hr over 30 Minutes Intravenous Every 24 hours 01/16/23 1027 01/20/23 1202   01/16/23 1115  doxycycline (VIBRA-TABS) tablet 100 mg        100 mg Oral Every 12 hours 01/16/23 1027     01/13/23 1800  azithromycin (ZITHROMAX) 500 mg in sodium chloride 0.9 % 250 mL IVPB        500 mg 250 mL/hr over 60 Minutes Intravenous Every 24 hours 01/13/23 1711 01/14/23 1851        Subjective: Promyse Kolbe after 3 L overnight; expressing feeling back and having increased productive coughing spells.  No fever, no chest pain, no nausea or vomiting.  Reports decreased appetite.  Objective: Vitals:   01/21/23 2023 01/22/23 0455 01/22/23 0830 01/22/23 1600  BP: (!) 144/51 (!) 161/70  (!) 165/75  Pulse: 97 89  96  Resp: 20 (!) 22  (!) 22  Temp: 98 F (36.7 C) 98.7 F (37.1 C)  98.4 F (36.9 C)  TempSrc:  SpO2: 98% 96% 98% 96%  Weight:      Height:        Intake/Output Summary (Last 24 hours) at 01/22/2023 1752 Last data filed at 01/22/2023 0500 Gross per 24 hour  Intake --  Output 800 ml  Net -800 ml   Filed Weights   01/13/23 1214 01/13/23 1747  Weight: 53.8 kg 56 kg   Physical Exam General exam: Alert, awake, oriented x 3; pleasant and feeling good and experiencing increased productive coughing spells.  3 L nasal cannula supplementation overnight.  Reported increasing urine output. Respiratory system: Decreased breath sounds at the bases; no frank crackles.  Positive rhonchi bilaterally.  No using accessory muscle.  Tachypnea with exertion appreciated.  2 L nasal cannula with good saturation appreciated at time of examination and patient receiving nebulizer management. Cardiovascular system: Rate controlled, no rubs, no gallops, no JVD.  Positive metallic click and murmur  appreciated on exam. Gastrointestinal system: Abdomen is nondistended, soft and nontender. No organomegaly or masses felt. Normal bowel sounds heard. Central nervous system: Alert and oriented. No focal neurological deficits. Extremities: No cyanosis or clubbing.  Positive trace edema bilaterally. Skin: No petechiae. Psychiatry: Judgement and insight appear normal. Mood & affect appropriate.    Data Reviewed: I have personally reviewed following labs and imaging studies  CBC: Recent Labs  Lab 01/16/23 0850 01/21/23 0329  WBC 5.2 4.7  HGB 9.4* 10.1*  HCT 30.8* 33.3*  MCV 100.0 100.0  PLT 150 150   Basic Metabolic Panel: Recent Labs  Lab 01/16/23 0850 01/17/23 0401 01/20/23 0427 01/21/23 0329  NA 131* 133* 133* 137  K 4.1 4.3 4.1 4.7  CL 103 105 108 107  CO2 18* 20* 20* 21*  GLUCOSE 154* 144* 89 98  BUN 59* 58* 41* 39*  CREATININE 2.35* 2.32* 1.61* 1.56*  CALCIUM 8.1* 8.2* 8.6* 8.9  PHOS  --  3.8  --   --    GFR: Estimated Creatinine Clearance: 19.9 mL/min (A) (by C-G formula based on SCr of 1.56 mg/dL (H)).  Liver Function Tests: Recent Labs  Lab 01/17/23 0401  ALBUMIN 2.9*   Radiology Studies: DG Chest 2 View  Result Date: 01/21/2023 CLINICAL DATA:  Shortness of breath EXAM: CHEST - 2 VIEW COMPARISON:  Chest x-rays dated 01/16/2023 and 12/24/2022. FINDINGS: Stable cardiomegaly. Increased opacity at the LEFT lung base. Increased opacity at the RIGHT lung base. Probable small bilateral pleural effusions. No pneumothorax is seen. Median sternotomy wires appear intact and stable in alignment. No acute-appearing osseous abnormality. IMPRESSION: 1. Increased opacity at both lung bases, LEFT greater than RIGHT, suspicious for multifocal pneumonia. 2. Probable small bilateral pleural effusions. 3. Stable cardiomegaly. Electronically Signed   By: Bary Richard M.D.   On: 01/21/2023 12:12    Scheduled Meds:  allopurinol  100 mg Oral Daily   amLODipine  10 mg Oral Daily    atorvastatin  80 mg Oral q1800   budesonide (PULMICORT) nebulizer solution  0.5 mg Nebulization BID   cefdinir  300 mg Oral Q12H   cloNIDine  0.3 mg Transdermal Q Sat-1800   dextromethorphan-guaiFENesin  1 tablet Oral BID   doxycycline  100 mg Oral Q12H   famotidine  20 mg Oral Daily   fentaNYL  1 patch Transdermal Q72H   furosemide  40 mg Oral Daily   hydrALAZINE  100 mg Oral TID   insulin aspart  0-5 Units Subcutaneous QHS   insulin aspart  0-9 Units Subcutaneous TID WC   metoprolol  succinate  25 mg Oral Daily   metroNIDAZOLE  500 mg Oral Q12H   pantoprazole  40 mg Oral BID   sertraline  25 mg Oral Daily   tadalafil (PAH)  40 mg Oral Daily   Warfarin - Pharmacist Dosing Inpatient   Does not apply q1600   Continuous Infusions:    LOS: 9 days   Vassie Loll M.D on 01/22/2023 at 5:52 PM  Go to [email or web address] - for contact info  [facility] - Office  [phone]  If 7PM-7AM, please contact night-coverage [email or web address] 01/22/2023, 5:52 PM

## 2023-01-22 NOTE — Progress Notes (Signed)
Mobility Specialist Progress Note:    01/22/23 1013  Mobility  Activity Ambulated with assistance in room  Level of Assistance Standby assist, set-up cues, supervision of patient - no hands on  Assistive Device Front wheel walker  Distance Ambulated (ft) 20 ft  Activity Response Tolerated well  Mobility Referral Yes  $Mobility charge 1 Mobility   Pt agreeable to mobility session, received in bed. Tolerated well, had audible SOB and coughing. SpO2 99% on 3L. Returned pt to bed, all needs met, alarm on.   Feliciana Rossetti Mobility Specialist Please contact via Special educational needs teacher or  Rehab office at [phone]

## 2023-01-22 NOTE — Progress Notes (Signed)
ANTICOAGULATION CONSULT NOTE  Pharmacy Consult for warfarin Indication: atrial fibrillation/ mechanical valve  Allergies  Allergen Reactions   Augmentin [Amoxicillin-Pot Clavulanate] Diarrhea and Nausea And Vomiting   Ciprofloxacin Diarrhea, Nausea Only and Other (See Comments)    Stomach ache   Codeine     Insomnia, anxiety   Coreg [Carvedilol] Other (See Comments)    Terrible cramping in the feet and had a lot of bowel movements, but not diarrhea   Diflucan [Fluconazole] Diarrhea   Levofloxacin In D5w     Vomiting, diarrhea, insomnia   Losartan Swelling and Other (See Comments)    Patient doesn't recall site of swelling   Privigen [Immune Globulin (Human)] Other (See Comments)    Severe/excruciating pain   Sulfamethoxazole-Trimethoprim Diarrhea, Nausea Only and Other (See Comments)    Stomach upset   Verapamil Hives   Zetia [Ezetimibe] Other (See Comments)    Reaction not recalled   Zocor [Simvastatin - High Dose] Other (See Comments)    Reaction not recalled   Gatifloxacin Rash and Other (See Comments)    Redness to skin around eye    Patient Measurements: Height: 4\' 10"  (147.3 cm) Weight: 56 kg (123 lb 7.3 oz) IBW/kg (Calculated) : 40.9  Vital Signs: Temp: 98.7 F (37.1 C) (04/15 0455) BP: 161/70 (04/15 0455) Pulse Rate: 89 (04/15 0455)  Labs: Recent Labs    01/20/23 0427 01/21/23 0329 01/22/23 0443  HGB  --  10.1*  --   HCT  --  33.3*  --   PLT  --  150  --   LABPROT 36.4* 42.1* 37.7*  INR 3.7* 4.5* 3.9*  CREATININE 1.61* 1.56*  --      Estimated Creatinine Clearance: 19.9 mL/min (A) (by C-G formula based on SCr of 1.56 mg/dL (H)).   Medical History: Past Medical History:  Diagnosis Date   Aortic atherosclerosis 01/23/2018   Atrial fibrillation    Benign positional vertigo 10/06/2011   Carotid artery disease    Chemotherapy induced neutropenia 07/21/2019   Cholelithiasis 01/23/2018   Cholelithiasis 01/23/2018   Chronic anticoagulation     Colovesical fistula, ruled out 04/14/2022   Coronary artery disease    status post coronary artery bypass grafting times 07/10/2004   Diabetes mellitus    Hypercholesteremia    Hypertension    Mechanical heart valve present    H. aortic valve replacement at the time of bypass surgery October 2005   Moderate to severe pulmonary hypertension    Peripheral arterial disease    history of left common iliac artery PTA and stenting for a chronic total occlusion 08/26/01   S/P cholecystectomy 03/06/2018    Assessment: 82 YOF on warfarin for atrial fibrillation admitted for abdominal pain and AKI. Pharmacy consulted to continue warfarin during admission.   INR 3.9  No overt bleeding issues noted. I expect after patient is off antibiotics(doxy) her INR will stabilize.For now patient continues on doxy and cefdinir. Patient diet has also appeared to have dropped off.    Patient taking 5mg  daily prior to admit.   Goal of Therapy:  INR goal 2.5-3.5 per anti-coag clinic note Monitor platelets by anticoagulation protocol: Yes   Plan:  Hold warfarin x 1 dose. Monitor daily INR and s/s of bleeding  Will likely need to decrease weekly dosing regimen at discharge.   Judeth Cornfield, PharmD Clinical Pharmacist 01/22/2023 7:51 AM

## 2023-01-23 ENCOUNTER — Encounter (HOSPITAL_COMMUNITY): Payer: Medicare HMO

## 2023-01-23 ENCOUNTER — Ambulatory Visit (HOSPITAL_COMMUNITY): Payer: Medicare HMO

## 2023-01-23 DIAGNOSIS — K529 Noninfective gastroenteritis and colitis, unspecified: Secondary | ICD-10-CM | POA: Diagnosis not present

## 2023-01-23 DIAGNOSIS — I482 Chronic atrial fibrillation, unspecified: Secondary | ICD-10-CM | POA: Diagnosis not present

## 2023-01-23 DIAGNOSIS — N179 Acute kidney failure, unspecified: Secondary | ICD-10-CM | POA: Diagnosis not present

## 2023-01-23 DIAGNOSIS — I5032 Chronic diastolic (congestive) heart failure: Secondary | ICD-10-CM | POA: Diagnosis not present

## 2023-01-23 LAB — PROTIME-INR
INR: 2.8 — ABNORMAL HIGH (ref 0.8–1.2)
Prothrombin Time: 29.2 seconds — ABNORMAL HIGH (ref 11.4–15.2)

## 2023-01-23 LAB — GLUCOSE, CAPILLARY
Glucose-Capillary: 90 mg/dL (ref 70–99)
Glucose-Capillary: 147 mg/dL — ABNORMAL HIGH (ref 70–99)
Glucose-Capillary: 114 mg/dL — ABNORMAL HIGH (ref 70–99)
Glucose-Capillary: 106 mg/dL — ABNORMAL HIGH (ref 70–99)

## 2023-01-23 LAB — BASIC METABOLIC PANEL
Anion gap: 6 (ref 5–15)
BUN: 37 mg/dL — ABNORMAL HIGH (ref 8–23)
CO2: 23 mmol/L (ref 22–32)
Calcium: 9 mg/dL (ref 8.9–10.3)
Chloride: 105 mmol/L (ref 98–111)
Creatinine, Ser: 1.51 mg/dL — ABNORMAL HIGH (ref 0.44–1.00)
GFR, Estimated: 34 mL/min — ABNORMAL LOW (ref >60)
Glucose, Bld: 84 mg/dL (ref 70–99)
Potassium: 4 mmol/L (ref 3.5–5.1)
Sodium: 134 mmol/L — ABNORMAL LOW (ref 135–145)

## 2023-01-23 MED ORDER — IPRATROPIUM-ALBUTEROL 0.5-2.5 (3) MG/3ML IN SOLN
3.0000 mL | Freq: Three times a day (TID) | RESPIRATORY_TRACT | Status: DC
  Administered 2023-01-23 – 2023-01-26 (×10): 3 mL via RESPIRATORY_TRACT
  Filled 2023-01-23 (×10): qty 3

## 2023-01-23 MED ORDER — WARFARIN SODIUM 2.5 MG PO TABS
2.5000 mg | ORAL_TABLET | Freq: Once | ORAL | Status: AC
  Administered 2023-01-23: 2.5 mg via ORAL
  Filled 2023-01-23: qty 1

## 2023-01-23 NOTE — Progress Notes (Signed)
ANTICOAGULATION CONSULT NOTE  Pharmacy Consult for warfarin Indication: atrial fibrillation/ mechanical valve  Allergies  Allergen Reactions   Augmentin [Amoxicillin-Pot Clavulanate] Diarrhea and Nausea And Vomiting   Ciprofloxacin Diarrhea, Nausea Only and Other (See Comments)    Stomach ache   Codeine     Insomnia, anxiety   Coreg [Carvedilol] Other (See Comments)    Terrible cramping in the feet and had a lot of bowel movements, but not diarrhea   Diflucan [Fluconazole] Diarrhea   Levofloxacin In D5w     Vomiting, diarrhea, insomnia   Losartan Swelling and Other (See Comments)    Patient doesn't recall site of swelling   Privigen [Immune Globulin (Human)] Other (See Comments)    Severe/excruciating pain   Sulfamethoxazole-Trimethoprim Diarrhea, Nausea Only and Other (See Comments)    Stomach upset   Verapamil Hives   Zetia [Ezetimibe] Other (See Comments)    Reaction not recalled   Zocor [Simvastatin - High Dose] Other (See Comments)    Reaction not recalled   Gatifloxacin Rash and Other (See Comments)    Redness to skin around eye    Patient Measurements: Height:  (147.3 cm) Weight: 56 kg (123 lb 7.3 oz) IBW/kg (Calculated) : 40.9  Vital Signs: Temp: 98.4 F (36.9 C) (04/16 0401) BP: 178/98 (04/16 0401) Pulse Rate: 95 (04/16 0401)  Labs: Recent Labs    01/21/23 0329 01/22/23 0443 01/23/23 0407  HGB 10.1*  --   --   HCT 33.3*  --   --   PLT 150  --   --   LABPROT 42.1* 37.7* 29.2*  INR 4.5* 3.9* 2.8*  CREATININE 1.56*  --  1.51*     Estimated Creatinine Clearance: 20.5 mL/min (A) (by C-G formula based on SCr of 1.51 mg/dL (H)).   Medical History: Past Medical History:  Diagnosis Date   Aortic atherosclerosis 01/23/2018   Atrial fibrillation    Benign positional vertigo 10/06/2011   Carotid artery disease    Chemotherapy induced neutropenia 07/21/2019   Cholelithiasis 01/23/2018   Cholelithiasis 01/23/2018   Chronic anticoagulation     Colovesical fistula, ruled out 04/14/2022   Coronary artery disease    status post coronary artery bypass grafting times 07/10/2004   Diabetes mellitus    Hypercholesteremia    Hypertension    Mechanical heart valve present    H. aortic valve replacement at the time of bypass surgery October 2005   Moderate to severe pulmonary hypertension    Peripheral arterial disease    history of left common iliac artery PTA and stenting for a chronic total occlusion 08/26/01   S/P cholecystectomy 03/06/2018    Assessment: 82 YOF on warfarin for atrial fibrillation admitted for abdominal pain and AKI. Pharmacy consulted to continue warfarin during admission.   INR 3.9> 2.8  No overt bleeding issues noted. I expect after patient is off antibiotics(doxy) her INR will stabilize.For now patient continues on doxy and cefdinir. Patient diet has also appeared to have dropped off.    Patient taking  daily prior to admit.   Goal of Therapy:  INR goal 2.5-3.5 per anti-coag clinic note Monitor platelets by anticoagulation protocol: Yes   Plan:  Warfarin 2.5 mg x 1 dose. Lower dose since patient keeps going supratherapeutic  Monitor daily INR and s/s of bleeding  Will likely need to decrease weekly dosing regimen at discharge.   Judeth Cornfield, PharmD Clinical Pharmacist 01/23/2023 10:42 AM

## 2023-01-23 NOTE — Plan of Care (Signed)
  Problem: Education: Goal: Knowledge of General Education information will improve Description Including pain rating scale, medication(s)/side effects and non-pharmacologic comfort measures Outcome: Progressing   Problem: Health Behavior/Discharge Planning: Goal: Ability to manage health-related needs will improve Outcome: Progressing   

## 2023-01-23 NOTE — Progress Notes (Signed)
Mobility Specialist Progress Note:    01/23/23 1053  Mobility  Activity Transferred from chair to bed  Level of Assistance Minimal assist, patient does 75% or more  Assistive Device Front wheel walker  Distance Ambulated (ft) 4 ft  Activity Response Tolerated well  Mobility Referral Yes  $Mobility charge 1 Mobility   Pt requested assistance to transfer C>B after first mobility session. Tolerated well, wheezing and SOB throughout. Left pt in bed, son in room, all needs met.   Feliciana Rossetti Mobility Specialist Please contact via Special educational needs teacher or  Rehab office at [phone]

## 2023-01-23 NOTE — Progress Notes (Signed)
RT started patient with the chest vest, chest vest performed for . Patient appeared to tolerate well, no complications noted. RT will continue to treat, monitor and assess.

## 2023-01-23 NOTE — Progress Notes (Signed)
PROGRESS NOTE  [name], is a 82 y.o. female, DOB - 1940/11/27, ZOX:[id number]  Admit date - 01/13/2023   Admitting Physician No admitting provider for patient encounter.  Outpatient Primary MD for the patient is Branch, [name], MD  LOS - 10  Chief Complaint  Patient presents with   Abdominal Pain      Brief Narrative:  82 y.o. 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 -Patient chronically on Lasix. -Baseline creatinine usually 1.5-1.7 -On admission on 01/13/2023 creatinine was up to 2.73 and BUN was up to 63 -Creatinine currently back to baseline at 1.5. -Continue to renally adjust medications, avoid nephrotoxic agents / dehydration  / hypotension -Continue to follow renal function trend.  2) acute hypoxic respiratory failure--new on 01/16/2023 -On 01/16/2023 patient developed respiratory distress with hypoxia requiring oxygen chest x-ray suggesting left-sided pneumonia -Continue treatment with antibiotics, given findings of multifocal pneumonia continue cefdinir and doxycycline and will add Flagyl. -Continue mucolytics and PRN bronchodilators; started on Pulmicort and also chest physiotherapy using vibrating vest. -Continue to wean off oxygen supplementation as tolerated; desaturation screen and reporting the need for 2-3 L nasal cannula supplementation. -Continue supportive care.  3) chronic anemia--multifactorial including underlying CKD -Patient with recent GI bleed -Patient required transfusion back in March 2024 -Hgb currently above 9 -Watch closely for any bleeding as patient is on chronic  anticoagulation with Coumadin. -No overt bleeding appreciated-continue to follow hemoglobin trend.  4)Diarrhea--recent antibiotic treatment for colitis,  -WBC 13.0 >> 11.6>>9.6> 5.2 -Stool for C. difficile and stool culture requested, but was not sent-patient's diarrhea appears to have resolved -Hold off on further antibiotic therapy for C. difficile. -If needed will add the use of Florastor. -Continue to maintain adequate hydration and follow clinical course.[name]  5)Chronic diastolic congestive heart failure, compensated -Concerns for dehydration due to GI losses -Patient received judicious fluid resuscitation and initially Lasix were held. -Lasix at home has been resumed; renal function stable and tolerating diuretics. -IV Lasix x 2 dosages provided to stabilize volume status. -Chest x-ray demonstrating vascular congestion and multifocal pneumonia. -Continue to follow daily weights and strict intake and output.   6)CAD s/p CABG/PAD---chest pain-free at this time -- Continue atorvastatin and metoprolol -- Hold aspirin for concern of GI bleed  -Patient is already on Coumadin for A-fib and mechanical aortic valve   7)Pulmonary hypertension -c/n Tadalafil and outpatient follow-up with pulmonologist/cardiologist.   8)Anxiety/depression -- Zoloft 25 mg p.o. daily -- Xanax 0.5 mg p.o. nightly as needed anxiety -Stable mood.   9)Type 2 diabetes mellitus Hemoglobin A1c 5.6 on  12/24/2022--reflecting excellent diabetic control PTA Use Novolog/Humalog Sliding scale insulin with Accu-Cheks/Fingersticks as ordered    10)GERD with superimposed Acute GI bleed -- Protonix 40 mg bid  -- Pepcid 20 mg bid  11)Paroxysmal atrial fibrillation/Mechanical AVR on Coumadin -Continue metoprolol for rate control -Pharmacy to help manage Coumadin therapy  12)Social/ethics--- DNR/DNI,  -Multiple comorbid conditions impacting overall prognosis -ReCurrent hospitalizations Previously seen by palliative  care team  13)HTN--continue clonidine patch, amlodipine and hydralazine as well as metoprolol for BP control -IV labetalol as needed elevated BP  14)HypoNatremia--- on admission sodium was 127  -Sodium is improving with hydration  15) chronic pain syndrome--continue Duragesic patch   Status is: Inpatient   Disposition: The patient is from: Home              Anticipated d/c is to: Home              Anticipated d/c date is: 1-2 days              Patient currently is not medically stable to d/c. Barriers: Not Clinically Stable-   Code Status :  -  Code Status: DNR   Family Communication:    NA (patient is alert, awake and coherent)   DVT Prophylaxis  :   - SCDs / Coumadin warfarin (COUMADIN) tablet 2.5 mg   Lab Results  Component Value Date   PLT 150 01/21/2023    Inpatient Medications  Scheduled Meds:  allopurinol  100 mg Oral Daily   amLODipine  10 mg Oral Daily   atorvastatin  80 mg Oral q1800   budesonide (PULMICORT) nebulizer solution  0.5 mg Nebulization BID   cefdinir  300 mg Oral Q12H   cloNIDine  0.3 mg Transdermal Q Sat-1800   dextromethorphan-guaiFENesin  1 tablet Oral BID   doxycycline  100 mg Oral Q12H   famotidine  20 mg Oral Daily   fentaNYL  1 patch Transdermal Q72H   furosemide  40 mg Oral Daily   hydrALAZINE  100 mg Oral TID   insulin aspart  0-5 Units Subcutaneous QHS   insulin aspart  0-9 Units Subcutaneous TID WC   ipratropium-albuterol  3 mL Nebulization TID   metoprolol succinate  25 mg Oral Daily   metroNIDAZOLE  500 mg Oral Q12H   pantoprazole  40 mg Oral BID   sertraline  25 mg Oral Daily   tadalafil (PAH)  40 mg Oral Daily   warfarin  2.5 mg Oral ONCE-1600   Warfarin - Pharmacist Dosing Inpatient   Does not apply q1600   Continuous Infusions:  PRN Meds:.acetaminophen, albuterol, ALPRAZolam, labetalol, loperamide, ondansetron   Anti-infectives (From admission, onward)    Start     Dose/Rate Route Frequency Ordered Stop   01/21/23  2200  cefdinir (OMNICEF) capsule 300 mg        300 mg Oral Every 12 hours 01/21/23 1331     01/21/23 1430  metroNIDAZOLE (FLAGYL) tablet 500 mg        500 mg Oral Every 12 hours 01/21/23 1331     01/21/23 1000  cefdinir (OMNICEF) capsule 300 mg  Status:  Discontinued        300 mg Oral Daily 01/20/23 1202 01/21/23 1331   01/16/23 1115  cefTRIAXone (ROCEPHIN) 1 g in sodium chloride 0.9 % 100 mL IVPB  Status:  Discontinued        1 g 200 mL/hr over 30 Minutes Intravenous Every 24 hours 01/16/23 1027 01/20/23 1202   01/16/23 1115  doxycycline (VIBRA-TABS) tablet 100 mg        100 mg Oral Every 12 hours 01/16/23 1027     01/13/23 1800  azithromycin (ZITHROMAX) 500 mg in sodium chloride 0.9 % 250 mL IVPB        500 mg 250 mL/hr over 60 Minutes Intravenous Every 24 hours 01/13/23 1711 01/14/23 1851        Subjective: Safiyya Tomb patient reports feeling bad and worsening increased feelings.  Complaining of productive coughing spells with inability to fully expectorate secretions.  Patient expressed chills (no fever documented) and decreased appetite.  General malaise  and deconditioning reported.  Objective: Vitals:   01/23/23 0756 01/23/23 0757 01/23/23 1348 01/23/23 1412  BP:   (!) 151/72   Pulse:   92   Resp:   20   Temp:   98.3 F (36.8 C)   TempSrc:   Oral   SpO2: 93% 96% 94% 93%  Weight:      Height:        Intake/Output Summary (Last 24 hours) at 01/23/2023 1530 Last data filed at 01/23/2023 1300 Gross per 24 hour  Intake 580 ml  Output 1600 ml  Net -1020 ml   Filed Weights   01/13/23 1214 01/13/23 1747  Weight: 53.8 kg 56 kg   Physical Exam General exam: Alert, awake, oriented x 3; reporting general malaise, ongoing coughing spells without ability to expectorate secretions; experiencing dyspnea on exertion.  No chest pain.  No nausea, no vomiting, no fever. Respiratory system: Positive rhonchi bilaterally; intermittent tachypnea appreciated while reviewing vital signs.   No using accessory muscle. Cardiovascular system: Positive murmur, no rubs, no gallops, click appreciated on examination, no JVD.  Rate Gastrointestinal system: Abdomen is nondistended, soft and nontender. No organomegaly or masses felt. Normal bowel sounds heard. Central nervous system: Alert and oriented. No focal neurological deficits. Extremities: No cyanosis or clubbing; trace edema appreciated bilaterally. Skin: No petechiae. Psychiatry: Judgement and insight appear normal. Mood & affect appropriate.   Data Reviewed: I have personally reviewed following labs and imaging studies  CBC: Recent Labs  Lab 01/21/23 0329  WBC 4.7  HGB 10.1*  HCT 33.3*  MCV 100.0  PLT 150   Basic Metabolic Panel: Recent Labs  Lab 01/17/23 0401 01/20/23 0427 01/21/23 0329 01/23/23 0407  NA 133* 133* 137 134*  K 4.3 4.1 4.7 4.0  CL 105 108 107 105  CO2 20* 20* 21* 23  GLUCOSE 144* 89 98 84  BUN 58* 41* 39* 37*  CREATININE 2.32* 1.61* 1.56* 1.51*  CALCIUM 8.2* 8.6* 8.9 9.0  PHOS 3.8  --   --   --    GFR: Estimated Creatinine Clearance: 20.5 mL/min (A) (by C-G formula based on SCr of 1.51 mg/dL (H)).  Liver Function Tests: Recent Labs  Lab 01/17/23 0401  ALBUMIN 2.9*   Radiology Studies: No results found.  Scheduled Meds:  allopurinol  100 mg Oral Daily   amLODipine  10 mg Oral Daily   atorvastatin  80 mg Oral q1800   budesonide (PULMICORT) nebulizer solution  0.5 mg Nebulization BID   cefdinir  300 mg Oral Q12H   cloNIDine  0.3 mg Transdermal Q Sat-1800   dextromethorphan-guaiFENesin  1 tablet Oral BID   doxycycline  100 mg Oral Q12H   famotidine  20 mg Oral Daily   fentaNYL  1 patch Transdermal Q72H   furosemide  40 mg Oral Daily   hydrALAZINE  100 mg Oral TID   insulin aspart  0-5 Units Subcutaneous QHS   insulin aspart  0-9 Units Subcutaneous TID WC   ipratropium-albuterol  3 mL Nebulization TID   metoprolol succinate  25 mg Oral Daily   metroNIDAZOLE  500 mg Oral Q12H    pantoprazole  40 mg Oral BID   sertraline  25 mg Oral Daily   tadalafil (PAH)  40 mg Oral Daily   warfarin  2.5 mg Oral ONCE-1600   Warfarin - Pharmacist Dosing Inpatient   Does not apply q1600   Continuous Infusions:    LOS: 10 days   Vassie Loll M.D on 01/23/2023 at  3:30 PM  Go to [email or web address] - for contact info  [facility] - Office  [phone]  If 7PM-7AM, please contact night-coverage [email or web address] 01/23/2023, 3:30 PM

## 2023-01-23 NOTE — Progress Notes (Signed)
Mobility Specialist Progress Note:  \  01/23/23 0945  Mobility  Activity Ambulated with assistance in hallway;Transferred from bed to chair  Level of Assistance Contact guard assist, steadying assist  Assistive Device Front wheel walker  Distance Ambulated (ft) 50 ft  Activity Response Tolerated well  Mobility Referral Yes  $Mobility charge 1 Mobility   Pt agreeable to mobility session. Tolerated well, had more productive coughs than previous sessions. Required CGA with RW for safety. Educated pt on importance of sitting in chair, with son's encouragement, pt agreed to sit up for an hour. Returned pt to room, sitting up in chair, son in room, all needs met. Will f/u for second mobility session and transfer pt to bed.   Feliciana Rossetti Mobility Specialist Please contact via Special educational needs teacher or  Rehab office at [phone]

## 2023-01-24 DIAGNOSIS — N189 Chronic kidney disease, unspecified: Secondary | ICD-10-CM | POA: Diagnosis not present

## 2023-01-24 DIAGNOSIS — N179 Acute kidney failure, unspecified: Secondary | ICD-10-CM | POA: Diagnosis not present

## 2023-01-24 LAB — PROTIME-INR
INR: 2.6 — ABNORMAL HIGH (ref 0.8–1.2)
Prothrombin Time: 27.8 seconds — ABNORMAL HIGH (ref 11.4–15.2)

## 2023-01-24 LAB — GLUCOSE, CAPILLARY
Glucose-Capillary: 95 mg/dL (ref 70–99)
Glucose-Capillary: 179 mg/dL — ABNORMAL HIGH (ref 70–99)
Glucose-Capillary: 179 mg/dL — ABNORMAL HIGH (ref 70–99)
Glucose-Capillary: 149 mg/dL — ABNORMAL HIGH (ref 70–99)

## 2023-01-24 MED ORDER — POLYETHYLENE GLYCOL 3350 17 G PO PACK
17.0000 g | PACK | Freq: Every day | ORAL | Status: DC
  Administered 2023-01-25: 17 g via ORAL
  Filled 2023-01-24 (×3): qty 1

## 2023-01-24 MED ORDER — BISACODYL 5 MG PO TBEC
5.0000 mg | DELAYED_RELEASE_TABLET | Freq: Every day | ORAL | Status: DC | PRN
  Administered 2023-01-24: 5 mg via ORAL
  Filled 2023-01-24: qty 1

## 2023-01-24 MED ORDER — WARFARIN SODIUM 2.5 MG PO TABS
2.5000 mg | ORAL_TABLET | Freq: Once | ORAL | Status: AC
  Administered 2023-01-24: 2.5 mg via ORAL
  Filled 2023-01-24: qty 1

## 2023-01-24 NOTE — Progress Notes (Signed)
Mobility Specialist Progress Note:    01/24/23 1105  Mobility  Activity Transferred from chair to bed  Level of Assistance Standby assist, set-up cues, supervision of patient - no hands on  Assistive Device Front wheel walker  Distance Ambulated (ft) 4 ft  Activity Response Tolerated well  Mobility Referral Yes  $Mobility charge 1 Mobility   Pt agreeable to mobility session. Requested transfer back to bed. Tolerated transfer C>B well. Required SB assistance with RW. All needs met, call bell in reach.   Feliciana Rossetti Mobility Specialist Please contact via Special educational needs teacher or  Rehab office at [phone]

## 2023-01-24 NOTE — TOC Progression Note (Signed)
Transition of Care [facility]) - Progression Note    Patient Details  Name: [name] MRN: [id number] Date of Birth: 1941-01-16  Transition of Care [employer]) CM/SW Contact  Leitha Bleak, RN Phone Number: 01/24/2023, 2:27 PM  Clinical Narrative:   TOC consulted to speak with Daughter. Starke Sink has concerns with her mother being here for 11 days and deconditioning and need some rehab before returning home. She has been to [place] and [place] in the past for SNF. MD updated, he will order PT eval.  Patient is on 3 L oxygen. At present time.    Expected Discharge Plan: Home w Home Health Services Barriers to Discharge: Continued Medical Work up  Expected Discharge Plan and Services In-house Referral: Clinical Social Work   Post Acute Care Choice: Resumption of Svcs/PTA Provider Living arrangements for the past 2 months: Single Family Home                     Social Determinants of Health (SDOH) Interventions SDOH Screenings   Food Insecurity: No Food Insecurity (12/25/2022)  Housing: Low Risk  (12/25/2022)  Transportation Needs: No Transportation Needs (12/25/2022)  Utilities: Not At Risk (12/25/2022)  Alcohol Screen: Low Risk  (02/13/2022)  Depression (PHQ2-9): Low Risk  (02/13/2022)  Financial Resource Strain: Low Risk  (02/28/2022)  Physical Activity: Inactive (02/28/2022)  Social Connections: Moderately Integrated (06/22/2022)  Stress: No Stress Concern Present (02/28/2022)  Tobacco Use: Medium Risk (01/13/2023)    Readmission Risk Interventions    01/14/2023   12:20 PM 12/25/2022    9:36 AM 08/30/2022   11:42 AM  Readmission Risk Prevention Plan  Transportation Screening Complete Complete Complete  Medication Review [employer]) Complete Complete Complete  HRI or Home Care Consult Complete Complete Complete  SW Recovery Care/Counseling Consult Complete Complete Complete  Palliative Care Screening Not Applicable Not Applicable Not Applicable  Skilled Nursing Facility Not  Applicable Not Applicable Not Applicable

## 2023-01-24 NOTE — Progress Notes (Addendum)
ANTICOAGULATION CONSULT NOTE  Pharmacy Consult for warfarin Indication: atrial fibrillation/ mechanical valve  Allergies  Allergen Reactions   Augmentin [Amoxicillin-Pot Clavulanate] Diarrhea and Nausea And Vomiting   Ciprofloxacin Diarrhea, Nausea Only and Other (See Comments)    Stomach ache   Codeine     Insomnia, anxiety   Coreg [Carvedilol] Other (See Comments)    Terrible cramping in the feet and had a lot of bowel movements, but not diarrhea   Diflucan [Fluconazole] Diarrhea   Levofloxacin In D5w     Vomiting, diarrhea, insomnia   Losartan Swelling and Other (See Comments)    Patient doesn't recall site of swelling   Privigen [Immune Globulin (Human)] Other (See Comments)    Severe/excruciating pain   Sulfamethoxazole-Trimethoprim Diarrhea, Nausea Only and Other (See Comments)    Stomach upset   Verapamil Hives   Zetia [Ezetimibe] Other (See Comments)    Reaction not recalled   Zocor [Simvastatin - High Dose] Other (See Comments)    Reaction not recalled   Gatifloxacin Rash and Other (See Comments)    Redness to skin around eye    Patient Measurements: Height: 4\' 10"  (147.3 cm) Weight: 56 kg (123 lb 7.3 oz) IBW/kg (Calculated) : 40.9  Vital Signs:    Labs: Recent Labs    01/22/23 0443 01/23/23 0407 01/24/23 0428  LABPROT 37.7* 29.2* 27.8*  INR 3.9* 2.8* 2.6*  CREATININE  --  1.51*  --      Estimated Creatinine Clearance: 20.5 mL/min (A) (by C-G formula based on SCr of 1.51 mg/dL (H)).   Medical History: Past Medical History:  Diagnosis Date   Aortic atherosclerosis 01/23/2018   Atrial fibrillation    Benign positional vertigo 10/06/2011   Carotid artery disease    Chemotherapy induced neutropenia 07/21/2019   Cholelithiasis 01/23/2018   Cholelithiasis 01/23/2018   Chronic anticoagulation    Colovesical fistula, ruled out 04/14/2022   Coronary artery disease    status post coronary artery bypass grafting times 07/10/2004   Diabetes mellitus     Hypercholesteremia    Hypertension    Mechanical heart valve present    H. aortic valve replacement at the time of bypass surgery October 2005   Moderate to severe pulmonary hypertension    Peripheral arterial disease    history of left common iliac artery PTA and stenting for a chronic total occlusion 08/26/01   S/P cholecystectomy 03/06/2018    Assessment: 82 YOF on warfarin for atrial fibrillation admitted for abdominal pain and AKI. Pharmacy consulted to continue warfarin during admission.  INR down to 2.6 this morning.   No overt bleeding issues noted. I expect after patient is off antibiotics(doxy/flagyl) her INR will stabilize.For now patient continues on doxy/cefdinir/flagyl. Patient's diet has also appeared to have declined.   Patient taking 5mg  daily prior to admit.   Goal of Therapy:  INR goal 2.5-3.5 per anti-coag clinic note Monitor platelets by anticoagulation protocol: Yes   Plan:  Warfarin 2.5 mg warfarin tonight. Lower dose since patient keeps going supratherapeutic, will need to watch INR closely.  Will likely need to decrease weekly dosing regimen at discharge.   Sheppard Coil PharmD., BCPS Clinical Pharmacist 01/24/2023 9:13 AM

## 2023-01-24 NOTE — Progress Notes (Signed)
Mobility Specialist Progress Note:    01/24/23 0920  Mobility  Activity Ambulated with assistance in hallway;Transferred from bed to chair  Level of Assistance Contact guard assist, steadying assist  Assistive Device Front wheel walker  Distance Ambulated (ft) 30 ft  Activity Response Tolerated well  Mobility Referral Yes  $Mobility charge 1 Mobility   Pt agreeable to mobility session. Tolerated well, asx throughout. SpO2 89% on 3L during ambulation. Returned pt to room, sitting up in chair. Will f/u to transfer pt back to bed, all needs met, call bell in reach.   Feliciana Rossetti Mobility Specialist Please contact via Special educational needs teacher or  Rehab office at [phone]

## 2023-01-24 NOTE — Consult Note (Signed)
   [facility] Inpatient Consult   01/24/2023  [name] [birth year] [id number]  Primary Care Provider:  Dr. Guerry Bruin with [facility] Insurance: [facility] [place].  Patient is currently active with [facility] Care Management for chronic disease management services.  Patient has been engaged by a [facility] RN.  Our community based plan of care has focused on disease management and community resource support.   Patient will receive a post hospital call and will be evaluated for assessments and disease process education.   Plan: Will alert [facility] RN active with this patient. Pending discharge disposition.  Inpatient Transition Of Care [TOC] team member to make aware that [facility] Care Management following.  Of note, [facility] Care Management services does not replace or interfere with any services that are needed or arranged by inpatient [facility] care management team.   For additional questions or referrals please contact:  Elliot Cousin, RN, BSN [facility] Liaison [place]   [facility]  Population Health Office Hours MTWF 8:00 am to 6 pm off on Thursday [phone] mobile [phone] [Office toll free line][facility] Office Hours are M-F 8:30 - 5 pm 24 hour nurse advise line [phone] Conceirge  [email or web address]

## 2023-01-24 NOTE — Progress Notes (Signed)
Called for breathing treatment. Patient with congested cough, wheezing and rhonchi. Treatment administered and CPT with vest performed.

## 2023-01-24 NOTE — Progress Notes (Signed)
PROGRESS NOTE    [name]  UJW:[id number] DOB: May 26, 1941 DOA: 01/13/2023 PCP: Antoine Poche, MD   Brief Narrative:    82 y.o. female with medical history significant of atrial fibrillation, heart failure preserved EF, CAD, pulmonary hypertension, essential hypertension, diabetes secondary to kidney disease, ANCA vasculitis not currently on treatment admitted on 01/13/2023 with AKI on CKD in the setting of persistent diarrhea.  Her renal function returned to baseline with hydration, however she developed acute hypoxemic respiratory failure suggesting left-sided pneumonia as well as pulmonary vascular congestion and was started on multiple antibiotics.  Assessment & Plan:   Principal Problem:   Acute kidney injury superimposed on chronic kidney disease Active Problems:   Chronic diastolic CHF (congestive heart failure)   Diabetes mellitus type 2, controlled   Essential hypertension   Carotid artery disease   Chronic atrial fibrillation   Carotid artery occlusion   Colitis   Joint pain  Assessment and Plan:  1)AKI----acute kidney injury on CKD stage - 3B-resolved -Due to dehydration/GI losses and prerenal azotemia. -Patient chronically on Lasix. -Baseline creatinine usually 1.5-1.7 -On admission on 01/13/2023 creatinine was up to 2.73 and BUN was up to 63 -Creatinine currently back to baseline at 1.5. -Continue to renally adjust medications, avoid nephrotoxic agents / dehydration  / hypotension -Continue to follow renal function trend.   2) acute hypoxic respiratory failure--new on 01/16/2023 -On 01/16/2023 patient developed respiratory distress with hypoxia requiring oxygen chest x-ray suggesting left-sided pneumonia -Continue treatment with antibiotics, given findings of multifocal pneumonia continue cefdinir and doxycycline and will add Flagyl. -Continue mucolytics and PRN bronchodilators; started on Pulmicort and also chest physiotherapy using vibrating vest. -Continue to  wean off oxygen supplementation as tolerated; desaturation screen and reporting the need for 2-3 L nasal cannula supplementation. -Continue supportive care.   3) chronic anemia--multifactorial including underlying CKD -Patient with recent GI bleed -Patient required transfusion back in March 2024 -Hgb currently above 9 -Watch closely for any bleeding as patient is on chronic anticoagulation with Coumadin. -No overt bleeding appreciated-continue to follow hemoglobin trend.   4)Diarrhea--resolved and now with constipation -Recent issue with C. Difficile -Trying bisacodyl for bowel movement   5)Chronic diastolic congestive heart failure, compensated -Concerns for dehydration due to GI losses -Patient received judicious fluid resuscitation and initially Lasix were held. -Lasix at home has been resumed; renal function stable and tolerating diuretics. -IV Lasix x 2 dosages provided to stabilize volume status. -Chest x-ray demonstrating vascular congestion and multifocal pneumonia. -Continue to follow daily weights and strict intake and output.   6)CAD s/p CABG/PAD---chest pain-free at this time -- Continue atorvastatin and metoprolol -- Hold aspirin for concern of GI bleed  -Patient is already on Coumadin for A-fib and mechanical aortic valve   7)Pulmonary hypertension -c/n Tadalafil and outpatient follow-up with pulmonologist/cardiologist.   8)Anxiety/depression -- Zoloft 25 mg p.o. daily -- Xanax 0.5 mg p.o. nightly as needed anxiety -Stable mood.   9)Type 2 diabetes mellitus Hemoglobin A1c 5.6 on  12/24/2022--reflecting excellent diabetic control PTA Use Novolog/Humalog Sliding scale insulin with Accu-Cheks/Fingersticks as ordered    10)GERD with superimposed Acute GI bleed -- Protonix 40 mg bid  -- Pepcid 20 mg bid   11)Paroxysmal atrial fibrillation/Mechanical AVR on Coumadin -Continue metoprolol for rate control -Pharmacy to help manage Coumadin therapy    12)Social/ethics--- DNR/DNI,  -Multiple comorbid conditions impacting overall prognosis -ReCurrent hospitalizations Previously seen by palliative care team   13)HTN--continue clonidine patch, amlodipine and hydralazine as well as metoprolol for BP  control -IV labetalol as needed elevated BP   14)HypoNatremia--- improved on admission sodium was 127  -Sodium is improving with hydration -Recheck in a.m.   15) chronic pain syndrome--continue Duragesic patch    DVT prophylaxis: Coumadin Code Status: DNR Family Communication: Discussed with daughter on phone 4/17 Disposition Plan: Home with home health with outpt palliative care Status is: Inpatient Remains inpatient appropriate because: Need for IV medications.   Skin Assessment:  I have examined the patient's skin and I agree with the wound assessment as performed by the wound care RN as outlined below:  Pressure Injury 01/13/23 Sacrum Medial;Bilateral Stage 2 -  Partial thickness loss of dermis presenting as a shallow open injury with a red, pink wound bed without slough. Wound is about the size of a quarter with linieal purple line through middle (Active)  01/13/23 1844  Location: Sacrum  Location Orientation: Medial;Bilateral  Staging: Stage 2 -  Partial thickness loss of dermis presenting as a shallow open injury with a red, pink wound bed without slough.  Wound Description (Comments): Wound is about the size of a quarter with linieal purple line through middle  Present on Admission: Yes  Dressing Type Foam - Lift dressing to assess site every shift 01/22/23 0819    Consultants:  None  Procedures:  None  Antimicrobials:  Anti-infectives (From admission, onward)    Start     Dose/Rate Route Frequency Ordered Stop   01/21/23 2200  cefdinir (OMNICEF) capsule 300 mg        300 mg Oral Every 12 hours 01/21/23 1331     01/21/23 1430  metroNIDAZOLE (FLAGYL) tablet 500 mg        500 mg Oral Every 12 hours 01/21/23 1331      01/21/23 1000  cefdinir (OMNICEF) capsule 300 mg  Status:  Discontinued        300 mg Oral Daily 01/20/23 1202 01/21/23 1331   01/16/23 1115  cefTRIAXone (ROCEPHIN) 1 g in sodium chloride 0.9 % 100 mL IVPB  Status:  Discontinued        1 g 200 mL/hr over 30 Minutes Intravenous Every 24 hours 01/16/23 1027 01/20/23 1202   01/16/23 1115  doxycycline (VIBRA-TABS) tablet 100 mg        100 mg Oral Every 12 hours 01/16/23 1027     01/13/23 1800  azithromycin (ZITHROMAX) 500 mg in sodium chloride 0.9 % 250 mL IVPB        500 mg 250 mL/hr over 60 Minutes Intravenous Every 24 hours 01/13/23 1711 01/14/23 1851       Subjective: Patient seen and evaluated today with ongoing congestion and cough.  She claims she is still not feeling much better.  Objective: Vitals:   01/23/23 1944 01/23/23 2040 01/24/23 0400 01/24/23 0707  BP:  (!) 155/67    Pulse:  94    Resp:  20    Temp:  97.9 F (36.6 C)    TempSrc:  Oral    SpO2: 92% 96% 91% 94%  Weight:      Height:        Intake/Output Summary (Last 24 hours) at 01/24/2023 1026 Last data filed at 01/24/2023 1000 Gross per 24 hour  Intake 680 ml  Output 1850 ml  Net -1170 ml   Filed Weights   01/13/23 1214 01/13/23 1747  Weight: 53.8 kg 56 kg    Examination:  General exam: Appears calm and comfortable  Respiratory system: Clear to auscultation. Respiratory effort normal.  3 L nasal cannula Cardiovascular system: S1 & S2 heard, RRR.  Gastrointestinal system: Abdomen is soft Central nervous system: Alert and awake Extremities: No edema Skin: No significant lesions noted Psychiatry: Flat affect.    Data Reviewed: I have personally reviewed following labs and imaging studies  CBC: Recent Labs  Lab 01/21/23 0329  WBC 4.7  HGB 10.1*  HCT 33.3*  MCV 100.0  PLT 150   Basic Metabolic Panel: Recent Labs  Lab 01/20/23 0427 01/21/23 0329 01/23/23 0407  NA 133* 137 134*  K 4.1 4.7 4.0  CL 108 107 105  CO2 20* 21* 23   GLUCOSE 89 98 84  BUN 41* 39* 37*  CREATININE 1.61* 1.56* 1.51*  CALCIUM 8.6* 8.9 9.0   GFR: Estimated Creatinine Clearance: 20.5 mL/min (A) (by C-G formula based on SCr of 1.51 mg/dL (H)). Liver Function Tests: No results for input(s): "AST", "ALT", "ALKPHOS", "BILITOT", "PROT", "ALBUMIN" in the last 168 hours. No results for input(s): "LIPASE", "AMYLASE" in the last 168 hours. No results for input(s): "AMMONIA" in the last 168 hours. Coagulation Profile: Recent Labs  Lab 01/20/23 0427 01/21/23 0329 01/22/23 0443 01/23/23 0407 01/24/23 0428  INR 3.7* 4.5* 3.9* 2.8* 2.6*   Cardiac Enzymes: No results for input(s): "CKTOTAL", "CKMB", "CKMBINDEX", "TROPONINI" in the last 168 hours. BNP (last 3 results) No results for input(s): "PROBNP" in the last 8760 hours. HbA1C: No results for input(s): "HGBA1C" in the last 72 hours. CBG: Recent Labs  Lab 01/23/23 0651 01/23/23 1120 01/23/23 1658 01/23/23 2041 01/24/23 0803  GLUCAP 90 147* 114* 106* 95   Lipid Profile: No results for input(s): "CHOL", "HDL", "LDLCALC", "TRIG", "CHOLHDL", "LDLDIRECT" in the last 72 hours. Thyroid Function Tests: No results for input(s): "TSH", "T4TOTAL", "FREET4", "T3FREE", "THYROIDAB" in the last 72 hours. Anemia Panel: No results for input(s): "VITAMINB12", "FOLATE", "FERRITIN", "TIBC", "IRON", "RETICCTPCT" in the last 72 hours. Sepsis Labs: No results for input(s): "PROCALCITON", "LATICACIDVEN" in the last 168 hours.  No results found for this or any previous visit (from the past 240 hour(s)).       Radiology Studies: No results found.      Scheduled Meds:  allopurinol  100 mg Oral Daily   amLODipine  10 mg Oral Daily   atorvastatin  80 mg Oral q1800   budesonide (PULMICORT) nebulizer solution  0.5 mg Nebulization BID   cefdinir  300 mg Oral Q12H   cloNIDine  0.3 mg Transdermal Q Sat-1800   dextromethorphan-guaiFENesin  1 tablet Oral BID   doxycycline  100 mg Oral Q12H    famotidine  20 mg Oral Daily   fentaNYL  1 patch Transdermal Q72H   furosemide  40 mg Oral Daily   hydrALAZINE  100 mg Oral TID   insulin aspart  0-5 Units Subcutaneous QHS   insulin aspart  0-9 Units Subcutaneous TID WC   ipratropium-albuterol  3 mL Nebulization TID   metoprolol succinate  25 mg Oral Daily   metroNIDAZOLE  500 mg Oral Q12H   pantoprazole  40 mg Oral BID   polyethylene glycol  17 g Oral Daily   sertraline  25 mg Oral Daily   tadalafil (PAH)  40 mg Oral Daily   warfarin  2.5 mg Oral ONCE-1600   Warfarin - Pharmacist Dosing Inpatient   Does not apply q1600     LOS: 11 days    Time spent: 35 minutes    Emalina Dubreuil Hoover Brunette, DO [facility]  If 7PM-7AM, please contact night-coverage [email or web address]  01/24/2023, 10:26 AM

## 2023-01-25 ENCOUNTER — Ambulatory Visit: Payer: Medicare HMO | Admitting: Adult Health

## 2023-01-25 ENCOUNTER — Other Ambulatory Visit: Payer: Self-pay

## 2023-01-25 DIAGNOSIS — N189 Chronic kidney disease, unspecified: Secondary | ICD-10-CM | POA: Diagnosis not present

## 2023-01-25 DIAGNOSIS — N179 Acute kidney failure, unspecified: Secondary | ICD-10-CM | POA: Diagnosis not present

## 2023-01-25 LAB — CBC
HCT: 34.2 % — ABNORMAL LOW (ref 36.0–46.0)
Hemoglobin: 10.7 g/dL — ABNORMAL LOW (ref 12.0–15.0)
MCH: 30.1 pg (ref 26.0–34.0)
MCHC: 31.3 g/dL (ref 30.0–36.0)
MCV: 96.3 fL (ref 80.0–100.0)
Platelets: 142 10*3/uL — ABNORMAL LOW (ref 150–400)
RBC: 3.55 MIL/uL — ABNORMAL LOW (ref 3.87–5.11)
RDW: 15.5 % (ref 11.5–15.5)
WBC: 5.1 10*3/uL (ref 4.0–10.5)
nRBC: 0 % (ref 0.0–0.2)

## 2023-01-25 LAB — BASIC METABOLIC PANEL
Anion gap: 9 (ref 5–15)
BUN: 40 mg/dL — ABNORMAL HIGH (ref 8–23)
CO2: 25 mmol/L (ref 22–32)
Calcium: 8.8 mg/dL — ABNORMAL LOW (ref 8.9–10.3)
Chloride: 102 mmol/L (ref 98–111)
Creatinine, Ser: 1.55 mg/dL — ABNORMAL HIGH (ref 0.44–1.00)
GFR, Estimated: 33 mL/min — ABNORMAL LOW (ref >60)
Glucose, Bld: 99 mg/dL (ref 70–99)
Potassium: 3.8 mmol/L (ref 3.5–5.1)
Sodium: 136 mmol/L (ref 135–145)

## 2023-01-25 LAB — GLUCOSE, CAPILLARY
Glucose-Capillary: 101 mg/dL — ABNORMAL HIGH (ref 70–99)
Glucose-Capillary: 170 mg/dL — ABNORMAL HIGH (ref 70–99)
Glucose-Capillary: 102 mg/dL — ABNORMAL HIGH (ref 70–99)
Glucose-Capillary: 134 mg/dL — ABNORMAL HIGH (ref 70–99)

## 2023-01-25 LAB — TROPONIN I (HIGH SENSITIVITY)
Troponin I (High Sensitivity): 10 ng/L (ref <18)
Troponin I (High Sensitivity): 10 ng/L (ref <18)

## 2023-01-25 LAB — MAGNESIUM: Magnesium: 1.5 mg/dL — ABNORMAL LOW (ref 1.7–2.4)

## 2023-01-25 LAB — PROTIME-INR
INR: 2.4 — ABNORMAL HIGH (ref 0.8–1.2)
Prothrombin Time: 26.1 seconds — ABNORMAL HIGH (ref 11.4–15.2)

## 2023-01-25 MED ORDER — WARFARIN SODIUM 2 MG PO TABS
4.0000 mg | ORAL_TABLET | Freq: Once | ORAL | Status: AC
  Administered 2023-01-25: 4 mg via ORAL
  Filled 2023-01-25: qty 2

## 2023-01-25 MED ORDER — ONDANSETRON HCL 4 MG/2ML IJ SOLN
4.0000 mg | Freq: Four times a day (QID) | INTRAMUSCULAR | Status: DC | PRN
  Administered 2023-01-25 (×2): 4 mg via INTRAVENOUS
  Filled 2023-01-25 (×3): qty 2

## 2023-01-25 MED ORDER — MAGNESIUM SULFATE 2 GM/50ML IV SOLN
2.0000 g | Freq: Once | INTRAVENOUS | Status: AC
  Administered 2023-01-25: 2 g via INTRAVENOUS
  Filled 2023-01-25: qty 50

## 2023-01-25 NOTE — Progress Notes (Signed)
ANTICOAGULATION CONSULT NOTE  Pharmacy Consult for warfarin Indication: atrial fibrillation/ mechanical valve  Allergies  Allergen Reactions   Augmentin [Amoxicillin-Pot Clavulanate] Diarrhea and Nausea And Vomiting   Ciprofloxacin Diarrhea, Nausea Only and Other (See Comments)    Stomach ache   Codeine     Insomnia, anxiety   Coreg [Carvedilol] Other (See Comments)    Terrible cramping in the feet and had a lot of bowel movements, but not diarrhea   Diflucan [Fluconazole] Diarrhea   Levofloxacin In D5w     Vomiting, diarrhea, insomnia   Losartan Swelling and Other (See Comments)    Patient doesn't recall site of swelling   Privigen [Immune Globulin (Human)] Other (See Comments)    Severe/excruciating pain   Sulfamethoxazole-Trimethoprim Diarrhea, Nausea Only and Other (See Comments)    Stomach upset   Verapamil Hives   Zetia [Ezetimibe] Other (See Comments)    Reaction not recalled   Zocor [Simvastatin - High Dose] Other (See Comments)    Reaction not recalled   Gatifloxacin Rash and Other (See Comments)    Redness to skin around eye    Patient Measurements: Height: 4\' 10"  (147.3 cm) Weight: 56 kg (123 lb 7.3 oz) IBW/kg (Calculated) : 40.9  Vital Signs: Temp: 98.2 F (36.8 C) (04/18 0532) Temp Source: Oral (04/18 0532) BP: 138/50 (04/18 0958) Pulse Rate: 69 (04/18 0958)  Labs: Recent Labs    01/23/23 0407 01/24/23 0428 01/25/23 0409 01/25/23 0813 01/25/23 1026  HGB  --   --  10.7*  --   --   HCT  --   --  34.2*  --   --   PLT  --   --  142*  --   --   LABPROT 29.2* 27.8* 26.1*  --   --   INR 2.8* 2.6* 2.4*  --   --   CREATININE 1.51*  --  1.55*  --   --   TROPONINIHS  --   --   --  10 10     Estimated Creatinine Clearance: 20 mL/min (A) (by C-G formula based on SCr of 1.55 mg/dL (H)).   Medical History: Past Medical History:  Diagnosis Date   Aortic atherosclerosis 01/23/2018   Atrial fibrillation    Benign positional vertigo 10/06/2011    Carotid artery disease    Chemotherapy induced neutropenia 07/21/2019   Cholelithiasis 01/23/2018   Cholelithiasis 01/23/2018   Chronic anticoagulation    Colovesical fistula, ruled out 04/14/2022   Coronary artery disease    status post coronary artery bypass grafting times 07/10/2004   Diabetes mellitus    Hypercholesteremia    Hypertension    Mechanical heart valve present    H. aortic valve replacement at the time of bypass surgery October 2005   Moderate to severe pulmonary hypertension    Peripheral arterial disease    history of left common iliac artery PTA and stenting for a chronic total occlusion 08/26/01   S/P cholecystectomy 03/06/2018    Assessment: 82 YOF on warfarin for atrial fibrillation admitted for abdominal pain and AKI. Pharmacy consulted to continue warfarin during admission.  INR 4.5 >3.9 >2.8> 2.6 >2.4  No overt bleeding issues noted. I expect after patient is off antibiotics(doxy/flagyl) her INR will stabilize.4/18 antibiotics stopped. Patient's diet has also appeared to have declined.   Patient taking 5mg  daily prior to admit.   Goal of Therapy:  INR goal 2.5-3.5 per anti-coag clinic note Monitor platelets by anticoagulation protocol: Yes   Plan:  Warfarin  4 mg warfarin tonight. Lower dose since patient keeps going supratherapeutic, will need to watch INR closely.  Will likely need to decrease weekly dosing regimen at discharge.   Judeth Cornfield, PharmD Clinical Pharmacist 01/25/2023 11:27 AM

## 2023-01-25 NOTE — Plan of Care (Signed)
  Problem: Acute Rehab PT Goals(only PT should resolve) Goal: Pt Will Go Supine/Side To Sit Outcome: Progressing Flowsheets (Taken 01/25/2023 0952) Pt will go Supine/Side to Sit:  Independently  with modified independence Goal: Patient Will Transfer Sit To/From Stand Outcome: Progressing Flowsheets (Taken 01/25/2023 [phone]) Patient will transfer sit to/from stand:  with modified independence  with supervision Goal: Pt Will Transfer Bed To Chair/Chair To Bed Outcome: Progressing Flowsheets (Taken 01/25/2023 0952) Pt will Transfer Bed to Chair/Chair to Bed:  with modified independence  with supervision Goal: Pt Will Ambulate Outcome: Progressing Flowsheets (Taken 01/25/2023 0952) Pt will Ambulate:  75 feet  with modified independence  with supervision  with rolling walker   9:53 AM, 01/25/23 Ocie Bob, MPT Physical Therapist with [facility] [phone] office [phone] mobile phone

## 2023-01-25 NOTE — TOC Progression Note (Signed)
Transition of Care [facility]) - Progression Note    Patient Details  Name: [name] MRN: [id number] Date of Birth: [birth year]  [facility] CM/SW Contact  Leitha Bleak, RN Phone Number: 01/25/2023, 1:13 PM  Clinical Narrative:   PT is recommending HHPT. Patient is active with [facility]. TOC following for HH orders.     Expected Discharge Plan: Home w Home Health Services Barriers to Discharge: Continued Medical Work up  Expected Discharge Plan and Services In-house Referral: Clinical Social Work   Post Acute Care Choice: Resumption of Svcs/PTA Provider Living arrangements for the past 2 months: Single Family Home    Social Determinants of Health (SDOH) Interventions SDOH Screenings   Food Insecurity: No Food Insecurity (12/25/2022)  Housing: Low Risk  (12/25/2022)  Transportation Needs: No Transportation Needs (12/25/2022)  Utilities: Not At Risk (12/25/2022)  Alcohol Screen: Low Risk  (02/13/2022)  Depression (PHQ2-9): Low Risk  (02/13/2022)  Financial Resource Strain: Low Risk  (02/28/2022)  Physical Activity: Inactive (02/28/2022)  Social Connections: Moderately Integrated (06/22/2022)  Stress: No Stress Concern Present (02/28/2022)  Tobacco Use: Medium Risk (01/13/2023)    Readmission Risk Interventions    01/14/2023   12:20 PM 12/25/2022    9:36 AM 08/30/2022   11:42 AM  Readmission Risk Prevention Plan  Transportation Screening Complete Complete Complete  Medication Review Oceanographer) Complete Complete Complete  HRI or Home Care Consult Complete Complete Complete  SW Recovery Care/Counseling Consult Complete Complete Complete  Palliative Care Screening Not Applicable Not Applicable Not Applicable  Skilled Nursing Facility Not Applicable Not Applicable Not Applicable

## 2023-01-25 NOTE — Progress Notes (Signed)
PROGRESS NOTE    [name]  OZH:[id number] DOB: 02-07-1941 DOA: 01/13/2023 PCP: Antoine Poche, MD   Brief Narrative:    82 y.o. female with medical history significant of atrial fibrillation, heart failure preserved EF, CAD, pulmonary hypertension, essential hypertension, diabetes secondary to kidney disease, ANCA vasculitis not currently on treatment admitted on 01/13/2023 with AKI on CKD in the setting of persistent diarrhea.  Her renal function returned to baseline with hydration, however she developed acute hypoxemic respiratory failure suggesting left-sided pneumonia as well as pulmonary vascular congestion and was started on multiple antibiotics.  She has been seen by PT with recommendations for home health and is noted to have some intractable nausea and vomiting.  Assessment & Plan:   Principal Problem:   Acute kidney injury superimposed on chronic kidney disease Active Problems:   Chronic diastolic CHF (congestive heart failure)   Diabetes mellitus type 2, controlled   Essential hypertension   Carotid artery disease   Chronic atrial fibrillation   Carotid artery occlusion   Colitis   Joint pain  Assessment and Plan:  1)AKI----acute kidney injury on CKD stage - 3B-resolved -Due to dehydration/GI losses and prerenal azotemia. -Patient chronically on Lasix. -Baseline creatinine usually 1.5-1.7 -On admission on 01/13/2023 creatinine was up to 2.73 and BUN was up to 63 -Creatinine currently back to baseline at 1.5. -Continue to renally adjust medications, avoid nephrotoxic agents / dehydration  / hypotension -Continue to follow renal function trend.   2) acute hypoxic respiratory failure--new on 01/16/2023 -On 01/16/2023 patient developed respiratory distress with hypoxia requiring oxygen chest x-ray suggesting left-sided pneumonia -Completed course of antibiotics, discontinue 4/18 -Continue mucolytics and PRN bronchodilators; started on Pulmicort and also chest  physiotherapy using vibrating vest. -Continue to wean off oxygen supplementation as tolerated; desaturation screen and reporting the need for 2-3 L nasal cannula supplementation. -Continue supportive care.   3) chronic anemia--multifactorial including underlying CKD -Patient with recent GI bleed -Patient required transfusion back in March 2024 -Hgb currently above 10 -Watch closely for any bleeding as patient is on chronic anticoagulation with Coumadin. -No overt bleeding appreciated-continue to follow hemoglobin trend.   4) intractable nausea and vomiting -Continue with Zofran as needed and monitor for improvement -If tolerating diet with no further nausea or vomiting, could anticipate discharge in the next 24 hours   5)Chronic diastolic congestive heart failure, compensated -Concerns for dehydration due to GI losses -Patient received judicious fluid resuscitation and initially Lasix were held. -Lasix at home has been resumed; renal function stable and tolerating diuretics. -IV Lasix x 2 dosages provided to stabilize volume status. -Chest x-ray demonstrating vascular congestion and multifocal pneumonia. -Continue to follow daily weights and strict intake and output.   6)CAD s/p CABG/PAD---chest pain-free at this time -- Continue atorvastatin and metoprolol -- Hold aspirin for concern of GI bleed  -Patient is already on Coumadin for A-fib and mechanical aortic valve   7)Pulmonary hypertension -c/n Tadalafil and outpatient follow-up with pulmonologist/cardiologist.   8)Anxiety/depression -- Zoloft 25 mg p.o. daily -- Xanax 0.5 mg p.o. nightly as needed anxiety -Stable mood.   9)Type 2 diabetes mellitus Hemoglobin A1c 5.6 on  12/24/2022--reflecting excellent diabetic control PTA Use Novolog/Humalog Sliding scale insulin with Accu-Cheks/Fingersticks as ordered    10)GERD with superimposed Acute GI bleed -- Protonix 40 mg bid  -- Pepcid 20 mg bid   11)Paroxysmal atrial  fibrillation/Mechanical AVR on Coumadin -Continue metoprolol for rate control -Pharmacy to help manage Coumadin therapy   12)Social/ethics--- DNR/DNI,  -Multiple  comorbid conditions impacting overall prognosis -ReCurrent hospitalizations Previously seen by palliative care team -PT recommending home health on discharge   13)HTN--continue clonidine patch, amlodipine and hydralazine as well as metoprolol for BP control -IV labetalol as needed elevated BP   14)HypoNatremia--- improved on admission sodium was 127  -Sodium is improving with hydration -Recheck in a.m.   15) chronic pain syndrome--continue Duragesic patch, PT recommending home health on discharge  16) hypomagnesemia-replete and reevaluate in a.m.    DVT prophylaxis: Coumadin Code Status: DNR Family Communication: Discussed with daughter on phone 4/18 Disposition Plan: Home with home health with outpt palliative care Status is: Inpatient Remains inpatient appropriate because: Need for IV medications.   Skin Assessment:  I have examined the patient's skin and I agree with the wound assessment as performed by the wound care RN as outlined below:  Pressure Injury 01/13/23 Sacrum Medial;Bilateral Stage 2 -  Partial thickness loss of dermis presenting as a shallow open injury with a red, pink wound bed without slough. Wound is about the size of a quarter with linieal purple line through middle (Active)  01/13/23 1844  Location: Sacrum  Location Orientation: Medial;Bilateral  Staging: Stage 2 -  Partial thickness loss of dermis presenting as a shallow open injury with a red, pink wound bed without slough.  Wound Description (Comments): Wound is about the size of a quarter with linieal purple line through middle  Present on Admission: Yes  Dressing Type Foam - Lift dressing to assess site every shift 01/22/23 0819    Consultants:  None  Procedures:  None  Antimicrobials:  Anti-infectives (From admission, onward)     Start     Dose/Rate Route Frequency Ordered Stop   01/21/23 2200  cefdinir (OMNICEF) capsule 300 mg  Status:  Discontinued        300 mg Oral Every 12 hours 01/21/23 1331 01/25/23 0900   01/21/23 1430  metroNIDAZOLE (FLAGYL) tablet 500 mg  Status:  Discontinued        500 mg Oral Every 12 hours 01/21/23 1331 01/25/23 0901   01/21/23 1000  cefdinir (OMNICEF) capsule 300 mg  Status:  Discontinued        300 mg Oral Daily 01/20/23 1202 01/21/23 1331   01/16/23 1115  cefTRIAXone (ROCEPHIN) 1 g in sodium chloride 0.9 % 100 mL IVPB  Status:  Discontinued        1 g 200 mL/hr over 30 Minutes Intravenous Every 24 hours 01/16/23 1027 01/20/23 1202   01/16/23 1115  doxycycline (VIBRA-TABS) tablet 100 mg  Status:  Discontinued        100 mg Oral Every 12 hours 01/16/23 1027 01/25/23 0900   01/13/23 1800  azithromycin (ZITHROMAX) 500 mg in sodium chloride 0.9 % 250 mL IVPB        500 mg 250 mL/hr over 60 Minutes Intravenous Every 24 hours 01/13/23 1711 01/14/23 1851       Subjective: Patient seen and evaluated today with ongoing nausea and vomiting this morning.  She denies any nausea overnight.  Denies abdominal pain.  Objective: Vitals:   01/24/23 2010 01/25/23 0532 01/25/23 0729 01/25/23 0958  BP:  (!) 163/61  (!) 138/50  Pulse:  77  69  Resp:  20    Temp:  98.2 F (36.8 C)    TempSrc:  Oral    SpO2: 97% 97% 97%   Weight:      Height:        Intake/Output Summary (Last 24 hours) at  01/25/2023 1003 Last data filed at 01/25/2023 0500 Gross per 24 hour  Intake 600 ml  Output 1500 ml  Net -900 ml   Filed Weights   01/13/23 1214 01/13/23 1747  Weight: 53.8 kg 56 kg    Examination:  General exam: Appears calm and comfortable  Respiratory system: Clear to auscultation. Respiratory effort normal.  3 L nasal cannula Cardiovascular system: S1 & S2 heard, RRR.  Gastrointestinal system: Abdomen is soft Central nervous system: Alert and awake Extremities: No edema Skin: No  significant lesions noted Psychiatry: Flat affect.    Data Reviewed: I have personally reviewed following labs and imaging studies  CBC: Recent Labs  Lab 01/21/23 0329 01/25/23 0409  WBC 4.7 5.1  HGB 10.1* 10.7*  HCT 33.3* 34.2*  MCV 100.0 96.3  PLT 150 142*   Basic Metabolic Panel: Recent Labs  Lab 01/20/23 0427 01/21/23 0329 01/23/23 0407 01/25/23 0409  NA 133* 137 134* 136  K 4.1 4.7 4.0 3.8  CL 108 107 105 102  CO2 20* 21* 23 25  GLUCOSE 89 98 84 99  BUN 41* 39* 37* 40*  CREATININE 1.61* 1.56* 1.51* 1.55*  CALCIUM 8.6* 8.9 9.0 8.8*  MG  --   --   --  1.5*   GFR: Estimated Creatinine Clearance: 20 mL/min (A) (by C-G formula based on SCr of 1.55 mg/dL (H)). Liver Function Tests: No results for input(s): "AST", "ALT", "ALKPHOS", "BILITOT", "PROT", "ALBUMIN" in the last 168 hours. No results for input(s): "LIPASE", "AMYLASE" in the last 168 hours. No results for input(s): "AMMONIA" in the last 168 hours. Coagulation Profile: Recent Labs  Lab 01/21/23 0329 01/22/23 0443 01/23/23 0407 01/24/23 0428 01/25/23 0409  INR 4.5* 3.9* 2.8* 2.6* 2.4*   Cardiac Enzymes: No results for input(s): "CKTOTAL", "CKMB", "CKMBINDEX", "TROPONINI" in the last 168 hours. BNP (last 3 results) No results for input(s): "PROBNP" in the last 8760 hours. HbA1C: No results for input(s): "HGBA1C" in the last 72 hours. CBG: Recent Labs  Lab 01/24/23 0803 01/24/23 1138 01/24/23 1710 01/24/23 1956 01/25/23 0741  GLUCAP 95 179* 179* 149* 101*   Lipid Profile: No results for input(s): "CHOL", "HDL", "LDLCALC", "TRIG", "CHOLHDL", "LDLDIRECT" in the last 72 hours. Thyroid Function Tests: No results for input(s): "TSH", "T4TOTAL", "FREET4", "T3FREE", "THYROIDAB" in the last 72 hours. Anemia Panel: No results for input(s): "VITAMINB12", "FOLATE", "FERRITIN", "TIBC", "IRON", "RETICCTPCT" in the last 72 hours. Sepsis Labs: No results for input(s): "PROCALCITON", "LATICACIDVEN" in the  last 168 hours.  No results found for this or any previous visit (from the past 240 hour(s)).       Radiology Studies: No results found.      Scheduled Meds:  allopurinol  100 mg Oral Daily   amLODipine  10 mg Oral Daily   atorvastatin  80 mg Oral q1800   budesonide (PULMICORT) nebulizer solution  0.5 mg Nebulization BID   cloNIDine  0.3 mg Transdermal Q Sat-1800   dextromethorphan-guaiFENesin  1 tablet Oral BID   famotidine  20 mg Oral Daily   fentaNYL  1 patch Transdermal Q72H   furosemide  40 mg Oral Daily   hydrALAZINE  100 mg Oral TID   insulin aspart  0-5 Units Subcutaneous QHS   insulin aspart  0-9 Units Subcutaneous TID WC   ipratropium-albuterol  3 mL Nebulization TID   metoprolol succinate  25 mg Oral Daily   pantoprazole  40 mg Oral BID   polyethylene glycol  17 g Oral Daily  sertraline  25 mg Oral Daily   tadalafil (PAH)  40 mg Oral Daily   Warfarin - Pharmacist Dosing Inpatient   Does not apply q1600     LOS: 12 days    Time spent: 35 minutes    Spenser Cong Hoover Brunette, DO [facility]  If 7PM-7AM, please contact night-coverage [email or web address] 01/25/2023, 10:03 AM

## 2023-01-25 NOTE — Evaluation (Signed)
Physical Therapy Evaluation Patient Details Name: [name] MRN: [id number] DOB: 1941/03/20 Today's Date: 01/25/2023  History of Present Illness  [name] is a 82 y.o. female with medical history significant of atrial fibrillation, heart failure preserved EF, CAD, pulmonary hypertension, essential hypertension, diabetes secondary to kidney disease, ANCA vasculitis not currently on treatment.  Patient is on chronic anticoagulation.  She was recently admitted on 3/17 for colitis.  At the time she had significant diarrhea and was put on antibiotics.  She was discharged on 3/24.  Over the past 2 days, the patient has been experiencing worse cramping abdominal pain with abdominal distention.  She has been having increased diarrhea with multiple stools a day.  She denies fevers, chills.  Her stools are black but she takes iron on a daily basis.  She has been worked up for a GI bleed in the past which has been completely negative.     Incidentally, the patient has not replaced her fentanyl patch in over a week   Clinical Impression  Patient demonstrates slightly labored movement for sitting up at bedside, good return for transferring to/from chair using RW, but had episode of nausea without vomiting that resolved after a few minutes - nurse aware.  Patient able to ambulate in room/hallway without loss of balance on room air with SpO2 dropping from 93% to 88%, but recovered to 95% after resting in chair.  Patient tolerated sitting up in chair after therapy - nurse notified.  Patient will benefit from continued skilled physical therapy in hospital and recommended venue below to increase strength, balance, endurance for safe ADLs and gait.         Recommendations for follow up therapy are one component of a multi-disciplinary discharge planning process, led by the attending physician.  Recommendations may be updated based on patient status, additional functional criteria and insurance  authorization.  Follow Up Recommendations Can patient physically be transported by private vehicle: Yes     Assistance Recommended at Discharge Set up Supervision/Assistance  Patient can return home with the following  A little help with bathing/dressing/bathroom;Help with stairs or ramp for entrance;Assistance with cooking/housework;A little help with walking and/or transfers    Equipment Recommendations None recommended by PT  Recommendations for Other Services       Functional Status Assessment Patient has had a recent decline in their functional status and demonstrates the ability to make significant improvements in function in a reasonable and predictable amount of time.     Precautions / Restrictions Precautions Precautions: Fall Restrictions Weight Bearing Restrictions: No      Mobility  Bed Mobility Overal bed mobility: Needs Assistance Bed Mobility: Supine to Sit     Supine to sit: Supervision     General bed mobility comments: labored movement for sitting up at bedside using bed rail with HOB flat    Transfers Overall transfer level: Needs assistance Equipment used: Rolling walker (2 wheels) Transfers: Sit to/from Stand, Bed to chair/wheelchair/BSC Sit to Stand: Supervision, Min guard   Step pivot transfers: Supervision, Min guard       General transfer comment: slightly labored movement    Ambulation/Gait Ambulation/Gait assistance: Supervision, Min guard Gait Distance (Feet): 40 Feet Assistive device: Rolling walker (2 wheels) Gait Pattern/deviations: Decreased step length - right, Decreased step length - left, Decreased stride length Gait velocity: decreased     General Gait Details: slow slightly labored cadence without loss of balance, on room air with SpO2 dropping from 93% to 88% and  limited mostly due to fatigue and nausea  Stairs            Wheelchair Mobility    Modified Rankin (Stroke Patients Only)       Balance Overall  balance assessment: Needs assistance Sitting-balance support: Feet supported, No upper extremity supported Sitting balance-Leahy Scale: Good Sitting balance - Comments: seated at EOB   Standing balance support: During functional activity, Bilateral upper extremity supported Standing balance-Leahy Scale: Fair Standing balance comment: fair/good using RW                             Pertinent Vitals/Pain Pain Assessment Pain Assessment: No/denies pain    Home Living Family/patient expects to be discharged to:: Private residence Living Arrangements: Children Available Help at Discharge: Family;Available 24 hours/day Type of Home: House Home Access: Stairs to enter Entrance Stairs-Rails: Left Entrance Stairs-Number of Steps: 3   Home Layout: One level Home Equipment: Agricultural consultant (2 wheels);Cane - single point;Shower seat;Crutches;Grab bars - toilet;Wheelchair - manual;Grab bars - tub/shower;Rollator (4 wheels)      Prior Function Prior Level of Function : Needs assist       Physical Assist : Mobility (physical);ADLs (physical) Mobility (physical): Bed mobility;Transfers;Gait;Stairs   Mobility Comments: Household ambulator with rollator and RW ADLs Comments: Independent with ADLs, family assist with iADLs     Hand Dominance   Dominant Hand: Right    Extremity/Trunk Assessment   Upper Extremity Assessment Upper Extremity Assessment: Overall WFL for tasks assessed    Lower Extremity Assessment Lower Extremity Assessment: Generalized weakness    Cervical / Trunk Assessment Cervical / Trunk Assessment: Normal  Communication   Communication: No difficulties  Cognition Arousal/Alertness: Awake/alert Behavior During Therapy: WFL for tasks assessed/performed Overall Cognitive Status: Within Functional Limits for tasks assessed                                          General Comments      Exercises     Assessment/Plan    PT  Assessment Patient needs continued PT services  PT Problem List Decreased strength;Decreased activity tolerance;Decreased balance;Decreased mobility       PT Treatment Interventions DME instruction;Stair training;Functional mobility training;Therapeutic activities;Gait training;Therapeutic exercise;Balance training;Patient/family education    PT Goals (Current goals can be found in the Care Plan section)  Acute Rehab PT Goals Patient Stated Goal: return home with family to assist PT Goal Formulation: With patient Time For Goal Achievement: 02/01/23 Potential to Achieve Goals: Good    Frequency Min 3X/week     Co-evaluation               AM-PAC PT "6 Clicks" Mobility  Outcome Measure Help needed turning from your back to your side while in a flat bed without using bedrails?: None Help needed moving from lying on your back to sitting on the side of a flat bed without using bedrails?: A Little Help needed moving to and from a bed to a chair (including a wheelchair)?: A Little Help needed standing up from a chair using your arms (e.g., wheelchair or bedside chair)?: A Little Help needed to walk in hospital room?: A Little Help needed climbing 3-5 steps with a railing? : A Little 6 Click Score: 19    End of Session   Activity Tolerance: Patient tolerated treatment well;Patient limited by fatigue Patient  left: in chair;with call bell/phone within reach;with chair alarm set Nurse Communication: Mobility status PT Visit Diagnosis: Unsteadiness on feet (R26.81);Other abnormalities of gait and mobility (R26.89);Muscle weakness (generalized) (M62.81)    Time: 4696-2952 PT Time Calculation (min) (ACUTE ONLY): 21 min   Charges:   PT Evaluation $PT Eval Moderate Complexity: 1 Mod PT Treatments $Therapeutic Activity: 8-22 mins        9:51 AM, 01/25/23 Ocie Bob, MPT Physical Therapist with [facility] 336 [phone] office [phone] mobile phone

## 2023-01-26 ENCOUNTER — Other Ambulatory Visit: Payer: Self-pay | Admitting: *Deleted

## 2023-01-26 DIAGNOSIS — I6523 Occlusion and stenosis of bilateral carotid arteries: Secondary | ICD-10-CM | POA: Diagnosis not present

## 2023-01-26 DIAGNOSIS — N179 Acute kidney failure, unspecified: Secondary | ICD-10-CM | POA: Diagnosis not present

## 2023-01-26 DIAGNOSIS — K922 Gastrointestinal hemorrhage, unspecified: Secondary | ICD-10-CM

## 2023-01-26 DIAGNOSIS — E1122 Type 2 diabetes mellitus with diabetic chronic kidney disease: Secondary | ICD-10-CM | POA: Diagnosis not present

## 2023-01-26 DIAGNOSIS — I5032 Chronic diastolic (congestive) heart failure: Secondary | ICD-10-CM | POA: Diagnosis not present

## 2023-01-26 LAB — CBC
HCT: 34.8 % — ABNORMAL LOW (ref 36.0–46.0)
Hemoglobin: 10.8 g/dL — ABNORMAL LOW (ref 12.0–15.0)
MCH: 29.8 pg (ref 26.0–34.0)
MCHC: 31 g/dL (ref 30.0–36.0)
MCV: 96.1 fL (ref 80.0–100.0)
Platelets: 148 10*3/uL — ABNORMAL LOW (ref 150–400)
RBC: 3.62 MIL/uL — ABNORMAL LOW (ref 3.87–5.11)
RDW: 15.4 % (ref 11.5–15.5)
WBC: 4.6 10*3/uL (ref 4.0–10.5)
nRBC: 0 % (ref 0.0–0.2)

## 2023-01-26 LAB — GLUCOSE, CAPILLARY
Glucose-Capillary: 141 mg/dL — ABNORMAL HIGH (ref 70–99)
Glucose-Capillary: 135 mg/dL — ABNORMAL HIGH (ref 70–99)

## 2023-01-26 LAB — BASIC METABOLIC PANEL
Anion gap: 10 (ref 5–15)
BUN: 41 mg/dL — ABNORMAL HIGH (ref 8–23)
CO2: 24 mmol/L (ref 22–32)
Calcium: 8.8 mg/dL — ABNORMAL LOW (ref 8.9–10.3)
Chloride: 102 mmol/L (ref 98–111)
Creatinine, Ser: 1.73 mg/dL — ABNORMAL HIGH (ref 0.44–1.00)
GFR, Estimated: 29 mL/min — ABNORMAL LOW (ref >60)
Glucose, Bld: 94 mg/dL (ref 70–99)
Potassium: 3.9 mmol/L (ref 3.5–5.1)
Sodium: 136 mmol/L (ref 135–145)

## 2023-01-26 LAB — MAGNESIUM: Magnesium: 2 mg/dL (ref 1.7–2.4)

## 2023-01-26 LAB — PROTIME-INR
INR: 2.3 — ABNORMAL HIGH (ref 0.8–1.2)
Prothrombin Time: 24.8 seconds — ABNORMAL HIGH (ref 11.4–15.2)

## 2023-01-26 MED ORDER — WARFARIN SODIUM 2.5 MG PO TABS: 5.0000 mg | ORAL_TABLET | Freq: Once | ORAL | Status: DC

## 2023-01-26 MED ORDER — BUDESONIDE 0.5 MG/2ML IN SUSP: 0.5000 mg | Freq: Two times a day (BID) | RESPIRATORY_TRACT | 1 refills | Status: DC

## 2023-01-26 MED ORDER — IPRATROPIUM-ALBUTEROL 0.5-2.5 (3) MG/3ML IN SOLN: 3.0000 mL | Freq: Four times a day (QID) | RESPIRATORY_TRACT | 0 refills | Status: DC | PRN

## 2023-01-26 NOTE — Discharge Summary (Signed)
Physician Discharge Summary   Patient: [name] MRN: [id number] DOB: Feb 22, 1941  Admit date:     01/13/2023  Discharge date: 01/26/23  Discharge Physician: Meredeth Ide   PCP: Antoine Poche, MD   Recommendations at discharge:   Follow-up PCP in 2 weeks Patient to follow-up with outpatient palliative care  Discharge Diagnoses: Principal Problem:   Acute kidney injury superimposed on chronic kidney disease Active Problems:   Chronic diastolic CHF (congestive heart failure)   Diabetes mellitus type 2, controlled   Essential hypertension   Carotid artery disease   Chronic atrial fibrillation   Carotid artery occlusion   Colitis   Joint pain  Resolved Problems:   * No resolved hospital problems. *   HPI  82 y.o. female with medical history significant of atrial fibrillation, heart failure preserved EF, CAD, pulmonary hypertension, essential hypertension, diabetes secondary to kidney disease, ANCA vasculitis not currently on treatment admitted on 01/13/2023 with AKI on CKD in the setting of persistent diarrhea.  Her renal function returned to baseline with hydration, however she developed acute hypoxemic respiratory failure suggesting left-sided pneumonia as well as pulmonary vascular congestion and was started on multiple antibiotics.  She has been seen by PT with recommendations for home health and is noted to have some intractable nausea and vomiting.     Assessment and Plan:   1)AKI----acute kidney injury on CKD stage - 3B-resolved -Due to dehydration/GI losses and prerenal azotemia. -Patient chronically on Lasix. -Baseline creatinine usually 1.5-1.7 -On admission on 01/13/2023 creatinine was up to 2.73 and BUN was up to 63 -Creatinine currently back to baseline at 1.73  2) acute hypoxic respiratory failure--new on 01/16/2023 -On 01/16/2023 patient developed respiratory distress with hypoxia requiring oxygen chest x-ray suggesting left-sided pneumonia -Completed course  of antibiotics, discontinue 4/18 -Continue mucolytics and PRN bronchodilators; started on Pulmicort and also chest physiotherapy using vibrating vest. -Oxygen has been weaned off to room air -Likely has underlying COPD -Will discharge on DuoNeb nebulizers every 6 hours as needed, Pulmicort nebulizer twice daily  3) chronic anemia--multifactorial including underlying CKD -Patient with recent GI bleed -Patient required transfusion back in March 2024 -Hgb currently above 10 -Watch closely for any bleeding as patient is on chronic anticoagulation with Coumadin. -No overt bleeding appreciated-continue to follow hemoglobin trend.   4) intractable nausea and vomiting -Continue with Zofran as needed and monitor for improvement -If tolerating diet with no further nausea or vomiting, could anticipate discharge in the next 24 hours   5)Chronic diastolic congestive heart failure, compensated -Concerns for dehydration due to GI losses -Patient received judicious fluid resuscitation and initially Lasix were held. -Lasix at home has been resumed; renal function stable and tolerating diuretics. -IV Lasix x 2 dosages provided to stabilize volume status. -Chest x-ray demonstrating vascular congestion and multifocal pneumonia. -Continue Lasix at home dose   6)CAD s/p CABG/PAD---chest pain-free at this time -- Continue atorvastatin and metoprolol -- Hold aspirin for concern of GI bleed  -Patient is already on Coumadin for A-fib and mechanical aortic valve   7)Pulmonary hypertension -c/n Tadalafil and outpatient follow-up with pulmonologist/cardiologist.   8)Anxiety/depression -- Zoloft 25 mg p.o. daily -- Xanax 0.5 mg p.o. nightly as needed anxiety -Stable mood.   9)Type 2 diabetes mellitus Hemoglobin A1c 5.6 on  12/24/2022--reflecting excellent diabetic control PTA -Continue home regimen   10)GERD with superimposed Acute GI bleed -- Protonix 40 mg bid  -- Pepcid 20 mg bid   11)Paroxysmal  atrial fibrillation/Mechanical AVR on Coumadin -  Continue metoprolol for rate control -Pharmacy to help manage Coumadin therapy   12)Social/ethics--- DNR/DNI,  -Multiple comorbid conditions impacting overall prognosis -ReCurrent hospitalizations Previously seen by palliative care team -PT recommending home health on discharge   13)HTN--continue clonidine patch, amlodipine and hydralazine as well as metoprolol for BP control    14)HypoNatremia--- improved on admission sodium was 127  -Sodium has improved to 136   15) chronic pain syndrome--continue Duragesic patch, PT recommending home health on discharge   16) hypomagnesemia-replete  Dr. Sherryll Burger discussed with patient's daughter, patient will go home with outpatient palliative care      Consultants:  Procedures performed:  Disposition: Home Diet recommendation:  Discharge Diet Orders (From admission, onward)     Start     Ordered   01/26/23 0000  Diet - low sodium heart healthy        01/26/23 1205           Cardiac diet DISCHARGE MEDICATION: Allergies as of 01/26/2023       Reactions   Augmentin [amoxicillin-pot Clavulanate] Diarrhea, Nausea And Vomiting   Ciprofloxacin Diarrhea, Nausea Only, Other (See Comments)   Stomach ache   Codeine    Insomnia, anxiety   Coreg [carvedilol] Other (See Comments)   Terrible cramping in the feet and had a lot of bowel movements, but not diarrhea   Diflucan [fluconazole] Diarrhea   Levofloxacin In D5w    Vomiting, diarrhea, insomnia   Losartan Swelling, Other (See Comments)   Patient doesn't recall site of swelling   Privigen [immune Globulin (human)] Other (See Comments)   Severe/excruciating pain   Sulfamethoxazole-trimethoprim Diarrhea, Nausea Only, Other (See Comments)   Stomach upset   Verapamil Hives   Zetia [ezetimibe] Other (See Comments)   Reaction not recalled   Zocor [simvastatin - High Dose] Other (See Comments)   Reaction not recalled   Gatifloxacin Rash,  Other (See Comments)   Redness to skin around eye        Medication List     TAKE these medications    acetaminophen 325 MG tablet Commonly known as: TYLENOL Take 325 mg by mouth every 6 (six) hours as needed for mild pain or fever.   allopurinol 100 MG tablet Commonly known as: ZYLOPRIM Take 100 mg by mouth daily.   ALPRAZolam 0.5 MG tablet Commonly known as: XANAX Take 0.5 mg by mouth daily.   amLODipine 10 MG tablet Commonly known as: NORVASC Take 1 tablet (10 mg total) by mouth daily.   atorvastatin 80 MG tablet Commonly known as: LIPITOR Take 1 tablet (80 mg total) by mouth daily at 6 PM.   B-D INS SYR ULTRAFINE .3CC/29G 29G X 1/2" 0.3 ML Misc Generic drug: Insulin Syringe-Needle U-100 Use for sliding scale for breakthrough high blood sugar readings   budesonide 0.5 MG/2ML nebulizer solution Commonly known as: PULMICORT Take 2 mLs (0.5 mg total) by nebulization 2 (two) times daily.   cloNIDine 0.3 mg/24hr patch Commonly known as: CATAPRES - Dosed in mg/24 hr Place 1 patch (0.3 mg total) onto the skin every Sunday. Start on 12/31/22   famotidine 20 MG tablet Commonly known as: PEPCID Take 20 mg by mouth 2 (two) times daily.   fentaNYL 25 MCG/HR Commonly known as: DURAGESIC Place 1 patch onto the skin every 3 (three) days.   ferrous sulfate 325 (65 FE) MG tablet Take 1 tablet (325 mg total) by mouth daily with breakfast.   furosemide 40 MG tablet Commonly known as: LASIX Take 0.5-1 tablets (  20-40 mg total) by mouth See admin instructions. -1 tablet (40 mg) every morning and 1/2 Tab (20 mg ) Qpm   hydrALAZINE 100 MG tablet Commonly known as: APRESOLINE Take 1 tablet (100 mg total) by mouth 3 (three) times daily.   insulin aspart 100 UNIT/ML injection Commonly known as: novoLOG Inject 2-8 Units into the skin 3 (three) times daily with meals. Per sliding scale, If blood sugar is 200 to 250, give 2 units. If blood sugar is 251 to 300, give 4 units. If  blood sugar is 301 to 350, give 6 units. If blood sugar is greater than 350, give 8 units and call MD.   ipratropium-albuterol 0.5-2.5 (3) MG/3ML Soln Commonly known as: DUONEB Take 3 mLs by nebulization every 6 (six) hours as needed.   lidocaine 5 % Commonly known as: LIDODERM Place 1 patch onto the skin daily. Remove & Discard patch within 12 hours or as directed by MD   metoprolol succinate 25 MG 24 hr tablet Commonly known as: TOPROL-XL Take 1 tablet (25 mg total) by mouth daily.   ondansetron 4 MG disintegrating tablet Commonly known as: ZOFRAN-ODT Take 4 mg by mouth every 8 (eight) hours as needed for nausea or vomiting.   pantoprazole 40 MG tablet Commonly known as: PROTONIX Take 1 tablet (40 mg total) by mouth 2 (two) times daily.   senna-docusate 8.6-50 MG tablet Commonly known as: Senokot-S Take 2 tablets by mouth at bedtime.   sertraline 25 MG tablet Commonly known as: ZOLOFT Take 25 mg by mouth daily.   simethicone 80 MG chewable tablet Commonly known as: Gas-X Chew 1 tablet (80 mg total) by mouth 4 (four) times daily as needed for flatulence.   tadalafil (PAH) 20 MG tablet Commonly known as: ADCIRCA TAKE 2 TABLETS BY MOUTH ONCE DAILY   Vitamin B12 1000 MCG Tbcr Take 1,000 mcg by mouth daily with lunch.   Vitamin D3 50 MCG (2000 UT) capsule Take 2,000 Units by mouth daily with lunch.   warfarin 5 MG tablet Commonly known as: COUMADIN Take as directed. If you are unsure how to take this medication, talk to your nurse or doctor. Original instructions: Take 1 tablet (5 mg total) by mouth See admin instructions. Take 1 tablet 5mg  on Tuesday/Thursday/Sunday and 1/2 Tab (2.5 mg) on MWFSat What changed: additional instructions               Durable Medical Equipment  (From admission, onward)           Start     Ordered   01/26/23 1206  For home use only DME Nebulizer machine  Once       Question Answer Comment  Patient needs a nebulizer to  treat with the following condition COPD (chronic obstructive pulmonary disease)   Length of Need Lifetime      04 /19/24 1205            Follow-up Information     [facility] Follow up.   Specialty: Hospice Why: Palliative team will call to scheudule your first appointment Contact information: [address] [place] [address] [phone]        [facility] Follow up.   Why: Home health will call to schedule your home visit.               Discharge Exam: Filed Weights   01/13/23 1214 01/13/23 1747  Weight: 53.8 kg 56 kg   General-appears in no acute  distress Heart-S1-S2, regular, no murmur auscultated Lungs-clear to auscultation bilaterally, no wheezing or crackles auscultated Abdomen-soft, nontender, no organomegaly Extremities-no edema in the lower extremities Neuro-alert, oriented x3, no focal deficit noted  Condition at discharge: good  The results of significant diagnostics from this hospitalization (including imaging, microbiology, ancillary and laboratory) are listed below for reference.   Imaging Studies: DG Chest 2 View  Result Date: 01/21/2023 CLINICAL DATA:  Shortness of breath EXAM: CHEST - 2 VIEW COMPARISON:  Chest x-rays dated 01/16/2023 and 12/24/2022. FINDINGS: Stable cardiomegaly. Increased opacity at the LEFT lung base. Increased opacity at the RIGHT lung base. Probable small bilateral pleural effusions. No pneumothorax is seen. Median sternotomy wires appear intact and stable in alignment. No acute-appearing osseous abnormality. IMPRESSION: 1. Increased opacity at both lung bases, LEFT greater than RIGHT, suspicious for multifocal pneumonia. 2. Probable small bilateral pleural effusions. 3. Stable cardiomegaly. Electronically Signed   By: Bary Richard M.D.   On: 01/21/2023 12:12   DG CHEST PORT 1 VIEW  Result Date: 01/16/2023 CLINICAL DATA:  Dyspnea and respiratory abnormalities. EXAM: PORTABLE  CHEST 1 VIEW COMPARISON:  12/24/2022 FINDINGS: The cardio pericardial silhouette is enlarged. Interstitial markings are diffusely coarsened with chronic features. Interval development of retrocardiac left base collapse/consolidation with small left pleural effusion. Calcified granuloma again noted right mid lung. Bones are diffusely demineralized. IMPRESSION: Interval development of retrocardiac left base collapse/consolidation with small left pleural effusion. Electronically Signed   By: Kennith Center M.D.   On: 01/16/2023 08:54   CT ABDOMEN PELVIS WO CONTRAST  Result Date: 01/13/2023 CLINICAL DATA:  Abdominal pain, diarrhea EXAM: CT ABDOMEN AND PELVIS WITHOUT CONTRAST TECHNIQUE: Multidetector CT imaging of the abdomen and pelvis was performed following the standard protocol without IV contrast. RADIATION DOSE REDUCTION: This exam was performed according to the departmental dose-optimization program which includes automated exposure control, adjustment of the mA and/or kV according to patient size and/or use of iterative reconstruction technique. COMPARISON:  12/24/2022 FINDINGS: Lower chest: Heart is enlarged in size. Coronary artery calcifications are seen. Dense calcification is seen in mitral annulus. Small to moderate bilateral pleural effusions are seen. There is peribronchial thickening. Small linear patchy infiltrates are seen in the lower lung fields. Hepatobiliary: Surgical clips are seen in gallbladder fossa. There is no significant dilation of bile ducts. Pancreas: Pancreas is smaller than usual in size. Spleen: There are calcified granulomas in spleen. Adrenals/Urinary Tract: Adrenals are unremarkable. There is no hydronephrosis. Extensive calcifications are seen in renal artery branches limiting evaluation for small nonobstructing renal stones. Ureters are not dilated. Urinary bladder is distended. There is no wall thickening in the bladder. Lower margin of the urinary bladder is below the level of  pubic symphysis suggesting cystocele. Stomach/Bowel: Small hiatal hernia is seen. Stomach is not distended. There is no significant small bowel dilation. Appendix is not seen. There is no significant wall thickening in colon. Multiple diverticula are seen in the left colon without signs of focal diverticulitis. Vascular/Lymphatic: Extensive arterial calcifications are seen in aorta and its branches including small peripheral branches. Reproductive: Uterus is not seen.  No adnexal masses are seen. Other: Small ascites is present.  There is no pneumoperitoneum. Musculoskeletal: Degenerative changes are noted in lumbar spine. Decrease in height of the body of L4 vertebra has not changed. Degenerative changes are noted in right hip. IMPRESSION: There is no evidence of intestinal obstruction or pneumoperitoneum. There is no hydronephrosis. Diverticulosis of colon without signs of focal diverticulitis. Minimal ascites. Cystocele. Small to moderate  bilateral pleural effusions. Severe arteriosclerosis. Coronary artery disease. Lumbar spondylosis. Degenerative changes are noted in right hip. Electronically Signed   By: Ernie Avena M.D.   On: 01/13/2023 13:56    Microbiology: Results for orders placed or performed during the hospital encounter of 12/24/22  Resp panel by RT-PCR (RSV, Flu A&B, Covid) Anterior Nasal Swab     Status: None   Collection Time: 12/24/22  5:11 AM   Specimen: Anterior Nasal Swab  Result Value Ref Range Status   SARS Coronavirus 2 by RT PCR NEGATIVE NEGATIVE Final    Comment: (NOTE) SARS-CoV-2 target nucleic acids are NOT DETECTED.  The SARS-CoV-2 RNA is generally detectable in upper respiratory specimens during the acute phase of infection. The lowest concentration of SARS-CoV-2 viral copies this assay can detect is 138 copies/mL. A negative result does not preclude SARS-Cov-2 infection and should not be used as the sole basis for treatment or other patient management  decisions. A negative result may occur with  improper specimen collection/handling, submission of specimen other than nasopharyngeal swab, presence of viral mutation(s) within the areas targeted by this assay, and inadequate number of viral copies(<138 copies/mL). A negative result must be combined with clinical observations, patient history, and epidemiological information. The expected result is Negative.  Fact Sheet for Patients:  BloggerCourse.com  Fact Sheet for Healthcare Providers:  SeriousBroker.it  This test is no t yet approved or cleared by the [place] FDA and  has been authorized for detection and/or diagnosis of SARS-CoV-2 by FDA under an Emergency Use Authorization (EUA). This EUA will remain  in effect (meaning this test can be used) for the duration of the COVID-19 declaration under Section 564(b)(1) of the Act, 21 U.S.C.section 360bbb-3(b)(1), unless the authorization is terminated  or revoked sooner.       Influenza A by PCR NEGATIVE NEGATIVE Final   Influenza B by PCR NEGATIVE NEGATIVE Final    Comment: (NOTE) The Xpert Xpress SARS-CoV-2/FLU/RSV plus assay is intended as an aid in the diagnosis of influenza from Nasopharyngeal swab specimens and should not be used as a sole basis for treatment. Nasal washings and aspirates are unacceptable for Xpert Xpress SARS-CoV-2/FLU/RSV testing.  Fact Sheet for Patients: BloggerCourse.com  Fact Sheet for Healthcare Providers: SeriousBroker.it  This test is not yet approved or cleared by the [place] FDA and has been authorized for detection and/or diagnosis of SARS-CoV-2 by FDA under an Emergency Use Authorization (EUA). This EUA will remain in effect (meaning this test can be used) for the duration of the COVID-19 declaration under Section 564(b)(1) of the Act, 21 U.S.C. section 360bbb-3(b)(1), unless the  authorization is terminated or revoked.     Resp Syncytial Virus by PCR NEGATIVE NEGATIVE Final    Comment: (NOTE) Fact Sheet for Patients: BloggerCourse.com  Fact Sheet for Healthcare Providers: SeriousBroker.it  This test is not yet approved or cleared by the [place] FDA and has been authorized for detection and/or diagnosis of SARS-CoV-2 by FDA under an Emergency Use Authorization (EUA). This EUA will remain in effect (meaning this test can be used) for the duration of the COVID-19 declaration under Section 564(b)(1) of the Act, 21 U.S.C. section 360bbb-3(b)(1), unless the authorization is terminated or revoked.  Performed at [facility] [address], [place], [place] [address]   Blood culture (routine x 2)     Status: None   Collection Time: 12/24/22  5:15 AM   Specimen: BLOOD LEFT HAND  Result Value Ref Range Status  Specimen Description   Final    BLOOD LEFT HAND BOTTLES DRAWN AEROBIC AND ANAEROBIC   Special Requests Blood Culture adequate volume  Final   Culture   Final    NO GROWTH 5 DAYS Performed at [facility], [address] [place], [place] [address]    Report Status 12/29/2022 FINAL  Final  Blood culture (routine x 2)     Status: None   Collection Time: 12/24/22  5:20 AM   Specimen: Left Antecubital; Blood  Result Value Ref Range Status   Specimen Description   Final    LEFT ANTECUBITAL BOTTLES DRAWN AEROBIC AND ANAEROBIC   Special Requests   Final    Blood Culture results may not be optimal due to an excessive volume of blood received in culture bottles   Culture   Final    NO GROWTH 5 DAYS Performed at [facility], [address], [place], [place] [address]    Report Status 12/29/2022 FINAL  Final  MRSA Next Gen by PCR, Nasal     Status: None   Collection Time: 12/24/22  6:06 PM   Specimen: Nasal Mucosa; Nasal Swab  Result Value Ref Range Status   MRSA by PCR Next Gen NOT DETECTED NOT  DETECTED Final    Comment: (NOTE) The GeneXpert MRSA Assay (FDA approved for NASAL specimens only), is one component of a comprehensive MRSA colonization surveillance program. It is not intended to diagnose MRSA infection nor to guide or monitor treatment for MRSA infections. Test performance is not FDA approved in patients less than 37 years old. Performed at [facility], [address], [place], [place] [address]    *Note: Due to a large number of results and/or encounters for the requested time period, some results have not been displayed. A complete set of results can be found in Results Review.    Labs: CBC: Recent Labs  Lab 01/21/23 0329 01/25/23 0409 01/26/23 0425  WBC 4.7 5.1 4.6  HGB 10.1* 10.7* 10.8*  HCT 33.3* 34.2* 34.8*  MCV 100.0 96.3 96.1  PLT 150 142* 148*   Basic Metabolic Panel: Recent Labs  Lab 01/20/23 0427 01/21/23 0329 01/23/23 0407 01/25/23 0409 01/26/23 0425  NA 133* 137 134* 136 136  K 4.1 4.7 4.0 3.8 3.9  CL 108 107 105 102 102  CO2 20* 21* 23 25 24   GLUCOSE 89 98 84 99 94  BUN 41* 39* 37* 40* 41*  CREATININE 1.61* 1.56* 1.51* 1.55* 1.73*  CALCIUM 8.6* 8.9 9.0 8.8* 8.8*  MG  --   --   --  1.5* 2.0   Liver Function Tests: No results for input(s): "AST", "ALT", "ALKPHOS", "BILITOT", "PROT", "ALBUMIN" in the last 168 hours. CBG: Recent Labs  Lab 01/25/23 1125 01/25/23 1614 01/25/23 2010 01/26/23 0815 01/26/23 1126  GLUCAP 170* 102* 134* 141* 135*    Discharge time spent: greater than 30 minutes.  Signed: Meredeth Ide, MD [facility] 01/26/2023

## 2023-01-26 NOTE — Progress Notes (Signed)
ANTICOAGULATION CONSULT NOTE  Pharmacy Consult for warfarin Indication: atrial fibrillation/ mechanical valve  Allergies  Allergen Reactions   Augmentin [Amoxicillin-Pot Clavulanate] Diarrhea and Nausea And Vomiting   Ciprofloxacin Diarrhea, Nausea Only and Other (See Comments)    Stomach ache   Codeine     Insomnia, anxiety   Coreg [Carvedilol] Other (See Comments)    Terrible cramping in the feet and had a lot of bowel movements, but not diarrhea   Diflucan [Fluconazole] Diarrhea   Levofloxacin In D5w     Vomiting, diarrhea, insomnia   Losartan Swelling and Other (See Comments)    Patient doesn't recall site of swelling   Privigen [Immune Globulin (Human)] Other (See Comments)    Severe/excruciating pain   Sulfamethoxazole-Trimethoprim Diarrhea, Nausea Only and Other (See Comments)    Stomach upset   Verapamil Hives   Zetia [Ezetimibe] Other (See Comments)    Reaction not recalled   Zocor [Simvastatin - High Dose] Other (See Comments)    Reaction not recalled   Gatifloxacin Rash and Other (See Comments)    Redness to skin around eye    Patient Measurements: Height:  (147.3 cm) Weight: 56 kg (123 lb 7.3 oz) IBW/kg (Calculated) : 40.9  Vital Signs: Temp: 98.2 F (36.8 C) (04/19 0447) Temp Source: Oral (04/19 0447) BP: 155/55 (04/19 0447) Pulse Rate: 65 (04/19 0447)  Labs: Recent Labs    01/24/23 0428 01/25/23 0409 01/25/23 0813 01/25/23 1026 01/26/23 0425  HGB  --  10.7*  --   --  10.8*  HCT  --  34.2*  --   --  34.8*  PLT  --  142*  --   --  148*  LABPROT 27.8* 26.1*  --   --  24.8*  INR 2.6* 2.4*  --   --  2.3*  CREATININE  --  1.55*  --   --   --   TROPONINIHS  --   --  10 10  --     Estimated Creatinine Clearance: 20 mL/min (A) (by C-G formula based on SCr of 1.55 mg/dL (H)).  Assessment: 82 YOF on warfarin for atrial fibrillation admitted for abdominal pain and AKI. Pharmacy consulted to continue warfarin during admission.  INR 4.5 >3.9  >2.8> 2.6 >2.4>2.3  No overt bleeding issues noted, cbc stable. I expect after patient is off antibiotics(doxy/flagyl) her INR will stabilize.4/18 antibiotics stopped. Patient's diet has also appeared to have declined. Noted plans for possible discharge  Patient taking  daily prior to admit.   Goal of Therapy:  INR goal 2.5-3.5 per anti-coag clinic note Monitor platelets by anticoagulation protocol: Yes   Plan:  Warfarin  po x1 Recommend home on  daily x 2.5mg  Wed/Sun.  Recheck INR in a week at coumadin clinic  Caryl Asp, PharmD Clinical Pharmacist 01/26/2023 7:56 AM

## 2023-01-26 NOTE — Progress Notes (Signed)
Nsg Discharge Note  Admit Date:  01/13/2023 Discharge date: 01/26/2023   [name] to be D/C'd Home per MD order.  AVS completed.   Patient/caregiver able to verbalize understanding.  Discharge Medication: Allergies as of 01/26/2023       Reactions   Augmentin [amoxicillin-pot Clavulanate] Diarrhea, Nausea And Vomiting   Ciprofloxacin Diarrhea, Nausea Only, Other (See Comments)   Stomach ache   Codeine    Insomnia, anxiety   Coreg [carvedilol] Other (See Comments)   Terrible cramping in the feet and had a lot of bowel movements, but not diarrhea   Diflucan [fluconazole] Diarrhea   Levofloxacin In D5w    Vomiting, diarrhea, insomnia   Losartan Swelling, Other (See Comments)   Patient doesn't recall site of swelling   Privigen [immune Globulin (human)] Other (See Comments)   Severe/excruciating pain   Sulfamethoxazole-trimethoprim Diarrhea, Nausea Only, Other (See Comments)   Stomach upset   Verapamil Hives   Zetia [ezetimibe] Other (See Comments)   Reaction not recalled   Zocor [simvastatin - High Dose] Other (See Comments)   Reaction not recalled   Gatifloxacin Rash, Other (See Comments)   Redness to skin around eye        Medication List     TAKE these medications    acetaminophen 325 MG tablet Commonly known as: TYLENOL Take 325 mg by mouth every 6 (six) hours as needed for mild pain or fever.   allopurinol 100 MG tablet Commonly known as: ZYLOPRIM Take 100 mg by mouth daily.   ALPRAZolam 0.5 MG tablet Commonly known as: XANAX Take 0.5 mg by mouth daily.   amLODipine 10 MG tablet Commonly known as: NORVASC Take 1 tablet (10 mg total) by mouth daily.   atorvastatin 80 MG tablet Commonly known as: LIPITOR Take 1 tablet (80 mg total) by mouth daily at 6 PM.   B-D INS SYR ULTRAFINE .3CC/29G 29G X 1/2" 0.3 ML Misc Generic drug: Insulin Syringe-Needle U-100 Use for sliding scale for breakthrough high blood sugar readings   budesonide 0.5 MG/2ML  nebulizer solution Commonly known as: PULMICORT Take 2 mLs (0.5 mg total) by nebulization 2 (two) times daily.   cloNIDine 0.3 mg/24hr patch Commonly known as: CATAPRES - Dosed in mg/24 hr Place 1 patch (0.3 mg total) onto the skin every Sunday. Start on 12/31/22   famotidine 20 MG tablet Commonly known as: PEPCID Take 20 mg by mouth 2 (two) times daily.   fentaNYL 25 MCG/HR Commonly known as: DURAGESIC Place 1 patch onto the skin every 3 (three) days.   ferrous sulfate 325 (65 FE) MG tablet Take 1 tablet (325 mg total) by mouth daily with breakfast.   furosemide 40 MG tablet Commonly known as: LASIX Take 0.5-1 tablets (20-40 mg total) by mouth See admin instructions. -1 tablet (40 mg) every morning and 1/2 Tab (20 mg ) Qpm   hydrALAZINE 100 MG tablet Commonly known as: APRESOLINE Take 1 tablet (100 mg total) by mouth 3 (three) times daily.   insulin aspart 100 UNIT/ML injection Commonly known as: novoLOG Inject 2-8 Units into the skin 3 (three) times daily with meals. Per sliding scale, If blood sugar is 200 to 250, give 2 units. If blood sugar is 251 to 300, give 4 units. If blood sugar is 301 to 350, give 6 units. If blood sugar is greater than 350, give 8 units and call MD.   ipratropium-albuterol 0.5-2.5 (3) MG/3ML Soln Commonly known as: DUONEB Take 3 mLs by nebulization every  6 (six) hours as needed.   lidocaine 5 % Commonly known as: LIDODERM Place 1 patch onto the skin daily. Remove & Discard patch within 12 hours or as directed by MD   metoprolol succinate 25 MG 24 hr tablet Commonly known as: TOPROL-XL Take 1 tablet (25 mg total) by mouth daily.   ondansetron 4 MG disintegrating tablet Commonly known as: ZOFRAN-ODT Take 4 mg by mouth every 8 (eight) hours as needed for nausea or vomiting.   pantoprazole 40 MG tablet Commonly known as: PROTONIX Take 1 tablet (40 mg total) by mouth 2 (two) times daily.   senna-docusate 8.6-50 MG tablet Commonly known  as: Senokot-S Take 2 tablets by mouth at bedtime.   sertraline 25 MG tablet Commonly known as: ZOLOFT Take 25 mg by mouth daily.   simethicone 80 MG chewable tablet Commonly known as: Gas-X Chew 1 tablet (80 mg total) by mouth 4 (four) times daily as needed for flatulence.   tadalafil (PAH) 20 MG tablet Commonly known as: ADCIRCA TAKE 2 TABLETS BY MOUTH ONCE DAILY   Vitamin B12 1000 MCG Tbcr Take 1,000 mcg by mouth daily with lunch.   Vitamin D3 50 MCG (2000 UT) capsule Take 2,000 Units by mouth daily with lunch.   warfarin 5 MG tablet Commonly known as: COUMADIN Take as directed. If you are unsure how to take this medication, talk to your nurse or doctor. Original instructions: Take 1 tablet (5 mg total) by mouth See admin instructions. Take 1 tablet  on Tuesday/Thursday/Sunday and 1/2 Tab (2.5 mg) on MWFSat What changed: additional instructions               Durable Medical Equipment  (From admission, onward)           Start     Ordered   01/26/23 1206  For home use only DME Nebulizer machine  Once       Question Answer Comment  Patient needs a nebulizer to treat with the following condition COPD (chronic obstructive pulmonary disease)   Length of Need Lifetime      04 /19/24 1205            Discharge Assessment: Vitals:   01/26/23 0447 01/26/23 0825  BP: (!) 155/55   Pulse: 65   Resp: 18   Temp: 98.2 F (36.8 C)   SpO2: 95% 95%   Skin clean, dry and intact without evidence of skin break down, no evidence of skin tears noted. IV catheter discontinued intact. Site without signs and symptoms of complications - no redness or edema noted at insertion site, patient denies c/o pain - only slight tenderness at site.  Dressing with slight pressure applied.  D/c Instructions-Education: Discharge instructions given to patient/family with verbalized understanding. D/c education completed with patient/family including follow up instructions, medication  list, d/c activities limitations if indicated, with other d/c instructions as indicated by MD - patient able to verbalize understanding, all questions fully answered. Patient instructed to return to ED, call 911, or call MD for any changes in condition.  Patient escorted via WC, and D/C home via private auto.  Laurena Spies, RN 01/26/2023 12:16 PM

## 2023-01-26 NOTE — TOC Transition Note (Signed)
Transition of Care [facility]) - CM/SW Discharge Note   Patient Details  Name: [name] MRN: [id number] Date of Birth: [birth year]  [facility] CM/SW Contact:  Leitha Bleak, RN Phone Number: 01/26/2023, 11:05 AM   Clinical Narrative:   Patient discharging home today. Orders placed for [facility] home [facility] for Pt/OT/RN. [facility] Custard updated.  Daughter, [place] Sink want [facility] for outpatient palliative. Referral sent, she also called them.     Final next level of care: Home w Home Health Services Barriers to Discharge: Barriers Resolved   Patient Goals and CMS Choice     Discharge Placement    Name of family member notified: [place] Sink Patient and family notified of of transfer: 01/26/23  Discharge Plan and Services Additional resources added to the After Visit Summary for   In-house Referral: Clinical Social Work   Post Acute Care Choice: Resumption of Svcs/PTA Provider               [facility] Agency: [facility] (Palliative)       Social Determinants of Health (SDOH) Interventions SDOH Screenings   Food Insecurity: No Food Insecurity (12/25/2022)  Housing: Low Risk  (12/25/2022)  Transportation Needs: No Transportation Needs (12/25/2022)  Utilities: Not At Risk (12/25/2022)  Alcohol Screen: Low Risk  (02/13/2022)  Depression (PHQ2-9): Low Risk  (02/13/2022)  Financial Resource Strain: Low Risk  (02/28/2022)  Physical Activity: Inactive (02/28/2022)  Social Connections: Moderately Integrated (06/22/2022)  Stress: No Stress Concern Present (02/28/2022)  Tobacco Use: Medium Risk (01/13/2023)    Readmission Risk Interventions    01/14/2023   12:20 PM 12/25/2022    9:36 AM 08/30/2022   11:42 AM  Readmission Risk Prevention Plan  Transportation Screening Complete Complete Complete  Medication Review [employer]) Complete Complete Complete  HRI or Home Care Consult Complete Complete Complete  SW Recovery Care/Counseling Consult Complete Complete Complete  Palliative  Care Screening Not Applicable Not Applicable Not Applicable  Skilled Nursing Facility Not Applicable Not Applicable Not Applicable

## 2023-01-28 DIAGNOSIS — E43 Unspecified severe protein-calorie malnutrition: Secondary | ICD-10-CM | POA: Diagnosis not present

## 2023-01-28 DIAGNOSIS — I2510 Atherosclerotic heart disease of native coronary artery without angina pectoris: Secondary | ICD-10-CM | POA: Diagnosis not present

## 2023-01-28 DIAGNOSIS — E1122 Type 2 diabetes mellitus with diabetic chronic kidney disease: Secondary | ICD-10-CM | POA: Diagnosis not present

## 2023-01-28 DIAGNOSIS — E1151 Type 2 diabetes mellitus with diabetic peripheral angiopathy without gangrene: Secondary | ICD-10-CM | POA: Diagnosis not present

## 2023-01-28 DIAGNOSIS — D631 Anemia in chronic kidney disease: Secondary | ICD-10-CM | POA: Diagnosis not present

## 2023-01-28 DIAGNOSIS — N184 Chronic kidney disease, stage 4 (severe): Secondary | ICD-10-CM | POA: Diagnosis not present

## 2023-01-28 DIAGNOSIS — I5032 Chronic diastolic (congestive) heart failure: Secondary | ICD-10-CM | POA: Diagnosis not present

## 2023-01-28 DIAGNOSIS — I272 Pulmonary hypertension, unspecified: Secondary | ICD-10-CM | POA: Diagnosis not present

## 2023-01-28 DIAGNOSIS — I130 Hypertensive heart and chronic kidney disease with heart failure and stage 1 through stage 4 chronic kidney disease, or unspecified chronic kidney disease: Secondary | ICD-10-CM | POA: Diagnosis not present

## 2023-01-29 ENCOUNTER — Telehealth: Payer: Self-pay | Admitting: *Deleted

## 2023-01-29 NOTE — Progress Notes (Unsigned)
  Care Coordination Note  01/29/2023 Name: [name] MRN: [id number] DOB: Jan 26, 1941  [name] is a 82 y.o. year old female who is a primary care patient of Wyline Mood, [name], MD and is actively engaged with the care management team. I reached out to [name] by phone today to assist with re-scheduling a follow up visit with the RN Case Manager  Follow up plan: Unsuccessful telephone outreach attempt made. A HIPAA compliant phone message was left for the patient providing contact information and requesting a return call.   [facility]  Care Coordination Care Guide  Direct Dial: [phone]

## 2023-01-30 DIAGNOSIS — E1151 Type 2 diabetes mellitus with diabetic peripheral angiopathy without gangrene: Secondary | ICD-10-CM | POA: Diagnosis not present

## 2023-01-30 DIAGNOSIS — D631 Anemia in chronic kidney disease: Secondary | ICD-10-CM | POA: Diagnosis not present

## 2023-01-30 DIAGNOSIS — I272 Pulmonary hypertension, unspecified: Secondary | ICD-10-CM | POA: Diagnosis not present

## 2023-01-30 DIAGNOSIS — E1122 Type 2 diabetes mellitus with diabetic chronic kidney disease: Secondary | ICD-10-CM | POA: Diagnosis not present

## 2023-01-30 DIAGNOSIS — E43 Unspecified severe protein-calorie malnutrition: Secondary | ICD-10-CM | POA: Diagnosis not present

## 2023-01-30 DIAGNOSIS — I2510 Atherosclerotic heart disease of native coronary artery without angina pectoris: Secondary | ICD-10-CM | POA: Diagnosis not present

## 2023-01-30 DIAGNOSIS — I5032 Chronic diastolic (congestive) heart failure: Secondary | ICD-10-CM | POA: Diagnosis not present

## 2023-01-30 DIAGNOSIS — I130 Hypertensive heart and chronic kidney disease with heart failure and stage 1 through stage 4 chronic kidney disease, or unspecified chronic kidney disease: Secondary | ICD-10-CM | POA: Diagnosis not present

## 2023-01-30 DIAGNOSIS — N184 Chronic kidney disease, stage 4 (severe): Secondary | ICD-10-CM | POA: Diagnosis not present

## 2023-01-30 NOTE — Progress Notes (Signed)
  Care Coordination Note  01/30/2023 Name: [name] MRN: [id number] DOB: 20-Aug-1941  [name] is a 82 y.o. year old female who is a primary care patient of Wyline Mood, [name], MD and is actively engaged with the care management team. I reached out to [name] by phone today to assist with re-scheduling a follow up visit with the RN Case Manager  Follow up plan: Telephone appointment with care management team member scheduled for:02/01/23 [facility] Coordination Care Guide  Direct Dial: [phone]

## 2023-01-30 NOTE — Progress Notes (Signed)
  Care Coordination Note  01/30/2023 Name: [name] MRN: [id number] DOB: 1940/10/23  [name] is a 82 y.o. year old female who is a primary care patient of Wyline Mood, [name], MD and is actively engaged with the care management team. I reached out to [name] by phone today to assist with scheduling a follow up visit with the RN Case Manager  Follow up plan: Unsuccessful telephone outreach attempt made. A HIPAA compliant phone message was left for the patient providing contact information and requesting a return call.   [facility]  Care Coordination Care Guide  Direct Dial: [phone]

## 2023-02-01 ENCOUNTER — Ambulatory Visit: Payer: Self-pay

## 2023-02-01 NOTE — Patient Instructions (Addendum)
Visit Information  Thank you for taking time to visit with me today. Please don't hesitate to contact me if I can be of assistance to you.   Following are the goals we discussed today:   Goals Addressed             This Visit's Progress    RN Care Coordination Activities: further follow up needed       Care Coordination Interventions: Evaluation of current treatment plan related to bilateral edema to lower extremities and patient's adherence to plan as established by provider Spoke with patient's daughter [name] Reviewed and discussed with daughter [place] [name], patient's most recent inpatient admission for acute kidney injury secondary to pneumonia and hypoxic respiratory failure Review of patient status, including review of consultant's reports, relevant laboratory and other test results, and medications completed Determined patient was discharged home with her daughter [name] with recommendations for Palliative Care, the initial home visit is scheduled for 02/15/23 Discussed patient's health is stable, however daughter [name] advised [facility] delayed patient getting her Albuterol and Pulmicort Determined patient's cough worsened due to not having her prescribed breathing treatments, the medications were delivered on 01/31/23 and patient's breathing treatments were resumed Educated daughter regarding basic disease process related to COPD and pneumonia recovery Instructed daughter to assist patient with using her incentive spirometer 10 times per hour to help expand lungs and move trapped air Educated daughter about the importance of administering breathing treatments exactly as directed and to notify patient's doctor promptly for new symptoms or concerns Discussed [facility] has started services, patient will receive PT x 4 weeks for strengthening and endurance  Reviewed with daughter patient's upcoming scheduled appointment with PCP, [name] on 02/13/23, daughter  [name] will  contact the office of Dr. Wyline Mood to request a post hospital follow up by 02/09/23 as directedral for Dr. Sherene Sires, Pulmonologist, she will call to reschedule patient's missed echocardiogram and f/u with Dr. Shirlee Latch  Discussed plans with daughter for ongoing care coordination follow up and provided daughter with direct contact information for this nurse care coordinator         Our next appointment is by telephone on 02/22/23 at 11:30 AM  Please call the care guide team at [phone] if you need to cancel or reschedule your appointment.   If you are experiencing a Mental Health or Behavioral Health Crisis or need someone to talk to, please call 1-800-273-TALK (toll free, 24 hour hotline) go to [facility] [address], [place] [phone])  Patient verbalizes understanding of instructions and care plan provided today and agrees to view in MyChart. Active MyChart status and patient understanding of how to access instructions and care plan via MyChart confirmed with patient.     Delsa Sale, RN, BSN, CCM Care Management Coordinator [facility]  Direct Phone: [phone]

## 2023-02-01 NOTE — Patient Outreach (Signed)
  Care Coordination   Follow Up Visit Note   02/01/2023 Name: [name] MRN: [id number] DOB: 13-Aug-1941  [name] is a 82 y.o. year old female who sees [name], [name] Koh, MD for primary care. I spoke with daughter [name] by phone today.  What matters to the patients health and wellness today?  Daughter would like for patient to work with PT to help with strengthening and endurance.     Goals Addressed             This Visit's Progress    RN Care Coordination Activities: further follow up needed       Care Coordination Interventions: Evaluation of current treatment plan related to bilateral edema to lower extremities and patient's adherence to plan as established by provider Spoke with patient's daughter [name] Reviewed and discussed with daughter [name], patient's most recent inpatient admission for acute kidney injury secondary to pneumonia and hypoxic respiratory failure Review of patient status, including review of consultant's reports, relevant laboratory and other test results, and medications completed Determined patient was discharged home with her daughter [name] with recommendations for Palliative Care, the initial home visit is scheduled for 02/15/23 Discussed patient's health is stable, however daughter [name] advised [facility] delayed patient getting her Albuterol and Pulmicort Determined patient's cough worsened due to not having her prescribed breathing treatments, the medications were delivered on 01/31/23 and patient's breathing treatments were resumed Educated daughter regarding basic disease process related to COPD and pneumonia recovery Instructed daughter to assist patient with using her incentive spirometer 10 times per hour to help expand lungs and move trapped air Educated daughter about the importance of administering breathing treatments exactly as directed and to notify patient's doctor promptly for new symptoms or concerns Discussed  [name], patient will receive PT x 4 weeks for strengthening and endurance  Reviewed with daughter patient's upcoming scheduled appointment with PCP, [name] on 02/13/23, daughter [name] will contact the office of [name] to request a post hospital follow up by 02/09/23 as directed Discussed daughter plans to ask PCP for a Pulmonology referral for [name], Pulmonologist, she will call to reschedule patient's missed echocardiogram and f/u with [name]  Discussed plans with daughter for ongoing care coordination follow up and provided daughter with direct contact information for this nurse care coordinator     Interventions Today    Flowsheet Row Most Recent Value  Chronic Disease   Chronic disease during today's visit Chronic Obstructive Pulmonary Disease (COPD), Other  [pneumonia]  General Interventions   General Interventions Discussed/Reviewed General Interventions Discussed, General Interventions Reviewed, Doctor Visits, Durable Medical Equipment (DME)  Doctor Visits Discussed/Reviewed Doctor Visits Discussed, Doctor Visits Reviewed, Specialist, PCP  Exercise Interventions   Exercise Discussed/Reviewed Exercise Reviewed, Exercise Discussed, Physical Activity  Physical Activity Discussed/Reviewed Home Exercise Program (HEP), Physical Activity Reviewed, Physical Activity Discussed  Education Interventions   Education Provided Provided Education  Provided Verbal Education On Medication, When to see the doctor  Pharmacy Interventions   Pharmacy Dicussed/Reviewed Medications and their functions, Pharmacy Topics Discussed          SDOH assessments and interventions completed:  No     Care Coordination Interventions:  Yes, provided   Follow up plan: Follow up call scheduled for 02/22/23 :30 AM    Encounter Outcome:  Pt. Visit Completed

## 2023-02-02 ENCOUNTER — Inpatient Hospital Stay: Payer: Medicare HMO

## 2023-02-02 ENCOUNTER — Inpatient Hospital Stay: Payer: Medicare HMO | Admitting: Adult Health

## 2023-02-02 ENCOUNTER — Encounter: Payer: Self-pay | Admitting: Adult Health

## 2023-02-02 ENCOUNTER — Inpatient Hospital Stay: Payer: Medicare HMO | Attending: Oncology

## 2023-02-02 ENCOUNTER — Other Ambulatory Visit: Payer: Self-pay

## 2023-02-02 VITALS — BP 138/47 | HR 68 | Temp 98.4°F | Resp 18 | Ht <= 58 in | Wt 105.1 lb

## 2023-02-02 DIAGNOSIS — D638 Anemia in other chronic diseases classified elsewhere: Secondary | ICD-10-CM

## 2023-02-02 DIAGNOSIS — D591 Autoimmune hemolytic anemia, unspecified: Secondary | ICD-10-CM | POA: Diagnosis not present

## 2023-02-02 DIAGNOSIS — N1832 Chronic kidney disease, stage 3b: Secondary | ICD-10-CM

## 2023-02-02 DIAGNOSIS — M8580 Other specified disorders of bone density and structure, unspecified site: Secondary | ICD-10-CM | POA: Insufficient documentation

## 2023-02-02 DIAGNOSIS — D631 Anemia in chronic kidney disease: Secondary | ICD-10-CM | POA: Insufficient documentation

## 2023-02-02 DIAGNOSIS — N184 Chronic kidney disease, stage 4 (severe): Secondary | ICD-10-CM | POA: Insufficient documentation

## 2023-02-02 LAB — COMPREHENSIVE METABOLIC PANEL
ALT: 13 U/L (ref 0–44)
AST: 38 U/L (ref 15–41)
Albumin: 4 g/dL (ref 3.5–5.0)
Alkaline Phosphatase: 128 U/L — ABNORMAL HIGH (ref 38–126)
Anion gap: 9 (ref 5–15)
BUN: 63 mg/dL — ABNORMAL HIGH (ref 8–23)
CO2: 26 mmol/L (ref 22–32)
Calcium: 10 mg/dL (ref 8.9–10.3)
Chloride: 100 mmol/L (ref 98–111)
Creatinine, Ser: 2.5 mg/dL — ABNORMAL HIGH (ref 0.44–1.00)
GFR, Estimated: 19 mL/min — ABNORMAL LOW (ref >60)
Glucose, Bld: 220 mg/dL — ABNORMAL HIGH (ref 70–99)
Potassium: 3.9 mmol/L (ref 3.5–5.1)
Sodium: 135 mmol/L (ref 135–145)
Total Bilirubin: 0.5 mg/dL (ref 0.3–1.2)
Total Protein: 6.3 g/dL — ABNORMAL LOW (ref 6.5–8.1)

## 2023-02-02 LAB — CBC WITH DIFFERENTIAL/PLATELET
Abs Immature Granulocytes: 0.01 10*3/uL (ref 0.00–0.07)
Basophils Absolute: 0.1 10*3/uL (ref 0.0–0.1)
Basophils Relative: 1 %
Eosinophils Absolute: 0.2 10*3/uL (ref 0.0–0.5)
Eosinophils Relative: 4 %
HCT: 36.7 % (ref 36.0–46.0)
Hemoglobin: 11.8 g/dL — ABNORMAL LOW (ref 12.0–15.0)
Immature Granulocytes: 0 %
Lymphocytes Relative: 30 %
Lymphs Abs: 1.6 10*3/uL (ref 0.7–4.0)
MCH: 29.6 pg (ref 26.0–34.0)
MCHC: 32.2 g/dL (ref 30.0–36.0)
MCV: 92.2 fL (ref 80.0–100.0)
Monocytes Absolute: 0.6 10*3/uL (ref 0.1–1.0)
Monocytes Relative: 11 %
Neutro Abs: 2.8 10*3/uL (ref 1.7–7.7)
Neutrophils Relative %: 54 %
Platelets: 172 10*3/uL (ref 150–400)
RBC: 3.98 MIL/uL (ref 3.87–5.11)
RDW: 15.2 % (ref 11.5–15.5)
WBC: 5.3 10*3/uL (ref 4.0–10.5)
nRBC: 0 % (ref 0.0–0.2)

## 2023-02-02 LAB — SAMPLE TO BLOOD BANK

## 2023-02-02 NOTE — Progress Notes (Signed)
[facility] Cancer Follow up:    [name], [name] Koh, MD [address] [place] [address]   DIAGNOSIS: Anemia  SUMMARY OF HEMATOLOGIC HISTORY: [name] is a 82 y.o. [place] woman with multifactorial anemia, as follows:             (a) anemia of chronic inflammation: As of May 2020 she has a SED rate of 95, CRP 1.1, positive ANA and RF; subsequently she was found to be ANCA positive             (b) hemolysis (mild): she has a mildly elevated LDH and a DAT positive for complement, not IgG; this is c/w (a) above             (c) anemia of renal insufficiency: inadequate EPO resonse to anemia                         (d) GIB/ iron deficiency in patient on lifelong anticoagulaton   (1) Feraheme received 03/01/2019, repeated 03/20/2019              (a) reticulocyte 03/07/2019 up from 43.4 a year ago to 191.8 after iron infusion             (b) feraheme repeated on 05/12/2019             (c) subsequent ferritin levels >100   (2) darbepoetin: starting 05/05/2019 at given every 2 weeks             (a) dose increased to 200 mcg every week starting 07/01/2019              (b) back to every 2 weeks beginning 08/04/2019 still at 200 mcg dose             (c) switched to Retacrit every 4 weeks as of 08/18/2019   (3) ANCA positive vasculitis: diagnosed 05/08/2019 (titer 1:640)             (a) prednisone 40 mg daily started 05/31/2019             (b) rituximab weekly started 06/12/2019 (received test dose 06/05/2019)                         (i) hepatitis B sAg, core Ab and HIV negative 05/08/2019                         (ii) rituximab held after 07/14/2019 dose (received 5 weekly doses) with neutropenia                         (iii) rituximab resumed 08/18/2019 but changed to monthly                         (iv) rituximab changed to every 2 months beginning with 02/02/2020 dose                         (v) rituximab changed to every 12 weeks after the 10/12/2020 dose              (c) prednisone tapered to off as of 09/15/2019                          (4) Anemia worsened in spring 2023, refractory  to Rituximab and requiring prednisone restart with slow taper due currently at 20mg  which she will stay at.   (5) osteopenia with a T score of -1.5 on bone density 02/28/2016 at the [facility] center  CURRENT THERAPY:Retacrit  INTERVAL HISTORY: [name] 82 y.o. female returns for f/u and evaluation of her anemia.  Since her last visit with [place] she was admitted to the hospital for AKI on CKD stage 3B. Her creatinine returned to baseline and she completed a course of antibiotics for possible left sided pneumonia.    She is accompanied by her son today. [name] tells me that she is feeling moderately well and she is pleased that her legs are not as swollen as they have been previously following hospital discharge.  Her hemoglobin today is 11.8.     Patient Active Problem List   Diagnosis Date Noted   Protein-calorie malnutrition, severe 12/26/2022   Acute GI bleeding 12/24/2022   Coagulopathy (HCC) 12/24/2022   Melena 12/24/2022   Duodenitis 12/24/2022   Noninfectious gastroenteritis 12/24/2022   CAD, multiple vessel 12/24/2022   Acute on chronic heart failure with preserved ejection fraction (HFpEF) (HCC) 08/29/2022   Acute exacerbation of CHF (congestive heart failure) (HCC) 08/29/2022   Acute urinary retention 08/28/2022   Acute kidney injury superimposed on chronic kidney disease (HCC) 08/27/2022   Constipation 08/27/2022   Nausea and vomiting 08/27/2022   Hypoalbuminemia due to protein-calorie malnutrition (HCC) 08/27/2022   Type 2 diabetes mellitus with hyperglycemia (HCC) 08/27/2022   Palliative care encounter    Goals of care, counseling/discussion    Encounter for therapeutic drug monitoring    GI bleed 07/09/2022   GI bleeding 07/08/2022   Acute on chronic anemia 07/08/2022   History of Clostridioides difficile colitis 07/08/2022   DNR (do not  resuscitate)/DNI(Do Not Intubate) 07/08/2022   Anemia of chronic disease 07/07/2022   Pressure injury of skin 06/23/2022   UTI (urinary tract infection) 06/22/2022   Joint pain 06/09/2022   Parietoalveolar pneumopathy (HCC) 06/09/2022   Primary osteoarthritis 06/09/2022   Rheumatoid factor positive 06/09/2022   C. difficile colitis 04/19/2022   Colitis 04/18/2022   PAH (pulmonary artery hypertension) (HCC) 04/18/2022   Emphysematous cystitis 04/13/2022   Female bladder prolapse 04/13/2022   Diverticulitis 04/12/2022   Autoimmune hemolytic anemia (HCC) 01/02/2022   Bilateral lower extremity edema 01/02/2022   Refractory anemia (HCC) 12/15/2021   Abnormal chest x-ray 12/15/2021   Supratherapeutic INR 12/15/2021   Elevated troponin 12/15/2021   Unilateral primary osteoarthritis, right hip 05/31/2021   Midline cystocele 05/30/2021   Primary localized osteoarthritis of pelvic region and thigh 05/30/2021   Vaginal pain 05/30/2021   Asymmetric SNHL (sensorineural hearing loss) 01/06/2021   Referred otalgia of both ears 01/06/2021   Bilateral hearing loss 08/31/2020   Low back pain 02/20/2020   CKD (chronic kidney disease), stage IV (HCC) 10/13/2019   Leukopenia due to antineoplastic chemotherapy (HCC) 08/18/2019   Thrombophilia (HCC) 06/19/2019   Encounter for general adult medical examination without abnormal findings 06/13/2019   Arteritis (HCC) 06/06/2019   Hypertensive heart and chronic kidney disease with heart failure and stage 1 through stage 4 chronic kidney disease, or unspecified chronic kidney disease (HCC) 06/06/2019   Muscle weakness 06/06/2019   Pancytopenia (HCC) 05/28/2019   Dyspnea 05/28/2019   Vasculitis, ANCA positive 05/28/2019   Disorder of connective tissue (HCC) 05/14/2019   Renal insufficiency 05/08/2019   PAF (paroxysmal atrial fibrillation) (HCC) 04/07/2019   Iron deficiency anemia 02/28/2019   Hemolytic  anemia associated with chronic inflammatory disease  (HCC) 02/28/2019   Diabetic retinopathy associated with type 2 diabetes mellitus (HCC) 09/02/2018   Chronic pain 06/19/2018   Non-thrombocytopenic purpura (HCC) 05/21/2018   Gout 03/22/2018   Malnutrition of moderate degree 03/12/2018   Diabetes mellitus type 2, controlled (HCC) 03/06/2018   Chronic atrial fibrillation (HCC) 03/06/2018   H/O mechanical aortic valve replacement 03/06/2018   Moderate to severe pulmonary hypertension (HCC) 03/06/2018   GERD without esophagitis 02/21/2018   Anxiety 02/21/2018   Chronic diastolic CHF (congestive heart failure) (HCC) 02/21/2018   General weakness 02/02/2018   Thrombocytopenia (HCC) 01/25/2018   Arm pain, diffuse 01/25/2018   Anemia 01/23/2018   Aortic atherosclerosis (HCC) 01/23/2018   Age-related osteoporosis without current pathological fracture 05/24/2017   Carotid artery occlusion 05/24/2017   Generalized anxiety disorder 05/24/2017   Hearing loss 05/24/2017   Presence of prosthetic heart valve 05/24/2017   Carotid artery disease (HCC) 08/18/2013   Peripheral arterial disease (HCC) 08/18/2013   Diabetes mellitus without complication (HCC) 08/18/2013   Essential hypertension    Hypercholesteremia    Mechanical heart valve present    Chronic anticoagulation     is allergic to augmentin [amoxicillin-pot clavulanate], ciprofloxacin, codeine, coreg [carvedilol], diflucan [fluconazole], levofloxacin in d5w, losartan, privigen [immune globulin (human)], sulfamethoxazole-trimethoprim, verapamil, zetia [ezetimibe], zocor [simvastatin - high dose], and gatifloxacin.  MEDICAL HISTORY: Past Medical History:  Diagnosis Date   Aortic atherosclerosis (HCC) 01/23/2018   Atrial fibrillation (HCC)    Benign positional vertigo 10/06/2011   Carotid artery disease (HCC)    Chemotherapy induced neutropenia (HCC) 07/21/2019   Cholelithiasis 01/23/2018   Cholelithiasis 01/23/2018   Chronic anticoagulation    Colovesical fistula, ruled out 04/14/2022    Coronary artery disease    status post coronary artery bypass grafting times 07/10/2004   Diabetes mellitus    Hypercholesteremia    Hypertension    Mechanical heart valve present    H. aortic valve replacement at the time of bypass surgery October 2005   Moderate to severe pulmonary hypertension (HCC)    Peripheral arterial disease (HCC)    history of left common iliac artery PTA and stenting for a chronic total occlusion 08/26/01   S/P cholecystectomy 03/06/2018    SURGICAL HISTORY: Past Surgical History:  Procedure Laterality Date   anterior repair  2009   AORTIC VALVE REPLACEMENT  2005   Dr. Laneta Simmers   CARDIAC CATHETERIZATION  11/10/2004   40% right common illiac, 70% in stent restenosis of distal left common illiac,    CARDIAC CATHETERIZATION  05/18/2004   LAD 50-70% midstenosis, RCA dominant w/50% stenosis, 50% Right common Illiac artery ostial stenosis, 90% in stent restenosis within midportion of left common illiac stent   Carotid Duplex  03/12/2012   RSA-elev. velocities suggestive of a 50-69% diameter reduction, Right&Left Bulb/Prox ICA-mild-mod.fibrous plaqueelevating Velocities abnormal study.   CHOLECYSTECTOMY N/A 03/01/2018   Procedure: LAPAROSCOPIC CHOLECYSTECTOMY;  Surgeon: Jimmye Norman, MD;  Location: [facility] OR;  Service: General;  Laterality: N/A;   COLONOSCOPY WITH PROPOFOL N/A 01/22/2018   Procedure: COLONOSCOPY WITH PROPOFOL;  Surgeon: Charlott Rakes, MD;  Location: [facility] ENDOSCOPY;  Service: Endoscopy;  Laterality: N/A;   ESOPHAGOGASTRODUODENOSCOPY N/A 07/13/2022   Procedure: ESOPHAGOGASTRODUODENOSCOPY (EGD);  Surgeon: Kerin Salen, MD;  Location: [facility] ENDOSCOPY;  Service: Gastroenterology;  Laterality: N/A;   ESOPHAGOGASTRODUODENOSCOPY (EGD) WITH PROPOFOL N/A 01/22/2018   Procedure: ESOPHAGOGASTRODUODENOSCOPY (EGD) WITH PROPOFOL;  Surgeon: Charlott Rakes, MD;  Location: [facility] ENDOSCOPY;  Service: Endoscopy;  Laterality: N/A;  ESOPHAGOGASTRODUODENOSCOPY (EGD) WITH  PROPOFOL N/A 11/21/2018   Procedure: ESOPHAGOGASTRODUODENOSCOPY (EGD) WITH PROPOFOL;  Surgeon: Kerin Salen, MD;  Location: [facility] ENDOSCOPY;  Service: Gastroenterology;  Laterality: N/A;   ESOPHAGOGASTRODUODENOSCOPY (EGD) WITH PROPOFOL Left 02/27/2019   Procedure: ESOPHAGOGASTRODUODENOSCOPY (EGD) WITH PROPOFOL;  Surgeon: Willis Modena, MD;  Location[facility] ENDOSCOPY;  Service: Endoscopy;  Laterality: Left;   GIVENS CAPSULE STUDY N/A 11/21/2018   Procedure: GIVENS CAPSULE STUDY;  Surgeon: Kerin Salen, MD;  Location: [facility] ENDOSCOPY;  Service: Gastroenterology;  Laterality: N/A;  To be deployed during EGD   Lower Ext. Duplex  03/12/2012   Right Proximal CIA- vessel narrowing w/elevated velocities 0-49% diameter reduction. Right SFA-mild mixed density plaque throughout vessel.   NM MYOCAR PERF WALL MOTION  05/19/2010   protocol: Persantine, post stress EF 65%, negative for ischemia, low risk scan   RIGHT HEART CATH N/A 06/27/2018   Procedure: RIGHT HEART CATH;  Surgeon: Laurey Morale, MD;  Location: [facility] INVASIVE CV LAB;  Service: Cardiovascular;  Laterality: N/A;   TOTAL ABDOMINAL HYSTERECTOMY W/ BILATERAL SALPINGOOPHORECTOMY  1989   TRANSTHORACIC ECHOCARDIOGRAM  08/29/2012   Moderately calcified annulus of mitral valve, moderate regurg. of both mitral valve and tricuspid valve.     SOCIAL HISTORY: Social History   Socioeconomic History   Marital status: Widowed    Spouse name: Not on file   Number of children: Not on file   Years of education: Not on file   Highest education level: Not on file  Occupational History   Occupation: Retired  Tobacco Use   Smoking status: Former    Packs/day: 1.00    Years: 30.00    Additional pack years: 0.00    Total pack years: 30.00    Types: Cigarettes   Smokeless tobacco: Never   Tobacco comments:    quit smoking 2005  Vaping Use   Vaping Use: Never used  Substance and Sexual Activity   Alcohol use: No    Alcohol/week: 0.0 standard drinks of alcohol    Drug use: No   Sexual activity: Not Currently    Birth control/protection: None  Other Topics Concern   Not on file  Social History Narrative   Not on file   Social Determinants of Health   Financial Resource Strain: Low Risk  (02/28/2022)   Overall Financial Resource Strain (CARDIA)    Difficulty of Paying Living Expenses: Not hard at all  Food Insecurity: No Food Insecurity (12/25/2022)   Hunger Vital Sign    Worried About Running Out of Food in the Last Year: Never true    Ran Out of Food in the Last Year: Never true  Transportation Needs: No Transportation Needs (12/25/2022)   PRAPARE - Administrator, Civil Service (Medical): No    Lack of Transportation (Non-Medical): No  Physical Activity: Inactive (02/28/2022)   Exercise Vital Sign    Days of Exercise per Week: 0 days    Minutes of Exercise per Session: 0 min  Stress: No Stress Concern Present (02/28/2022)   [employer] of Occupational Health - Occupational Stress Questionnaire    Feeling of Stress : Only a little  Social Connections: Moderately Integrated (06/22/2022)   Social Connection and Isolation Panel [NHANES]    Frequency of Communication with Friends and Family: More than three times a week    Frequency of Social Gatherings with Friends and Family: More than three times a week    Attends Religious Services: More than 4 times per year  Active Member of Clubs or Organizations: Yes    Attends Banker Meetings: More than 4 times per year    Marital Status: Widowed  Intimate Partner Violence: Not At Risk (12/25/2022)   Humiliation, Afraid, Rape, and Kick questionnaire    Fear of Current or Ex-Partner: No    Emotionally Abused: No    Physically Abused: No    Sexually Abused: No    FAMILY HISTORY: Family History  Problem Relation Age of Onset   Heart disease Father    Hypertension Father    Hyperlipidemia Father    Breast cancer Neg Hx     Review of Systems  Constitutional:   Positive for fatigue. Negative for appetite change, chills, fever and unexpected weight change.  HENT:   Negative for hearing loss, lump/mass, mouth sores and trouble swallowing.   Eyes:  Negative for eye problems and icterus.  Respiratory:  Negative for chest tightness, cough and shortness of breath.   Cardiovascular:  Positive for leg swelling. Negative for chest pain and palpitations.  Gastrointestinal:  Negative for abdominal distention, abdominal pain, constipation, diarrhea, nausea and vomiting.  Endocrine: Negative for hot flashes.  Genitourinary:  Negative for difficulty urinating.   Musculoskeletal:  Negative for arthralgias.  Skin:  Negative for itching and rash.  Neurological:  Negative for dizziness, extremity weakness, headaches and numbness.  Hematological:  Negative for adenopathy. Does not bruise/bleed easily.  Psychiatric/Behavioral:  Negative for depression. The patient is not nervous/anxious.       PHYSICAL EXAMINATION    Vitals:   02/02/23 1353  BP: (!) 138/47  Pulse: 68  Resp: 18  Temp: 98.4 F (36.9 C)  SpO2: 100%    Physical Exam Constitutional:      General: She is not in acute distress.    Appearance: Normal appearance. She is ill-appearing (appears chronically ill). She is not toxic-appearing.  HENT:     Head: Normocephalic and atraumatic.  Eyes:     General: No scleral icterus. Cardiovascular:     Rate and Rhythm: Normal rate and regular rhythm.     Pulses: Normal pulses.     Heart sounds: Normal heart sounds.  Pulmonary:     Effort: Pulmonary effort is normal.     Breath sounds: Normal breath sounds.  Abdominal:     General: Abdomen is flat. Bowel sounds are normal. There is no distension.     Palpations: Abdomen is soft.     Tenderness: There is no abdominal tenderness.     Comments: Limited exam while in wheelchair   Musculoskeletal:        General: Swelling (mild bilateral lower extremity pedal) present.     Cervical back: Neck  supple.  Lymphadenopathy:     Cervical: No cervical adenopathy.  Skin:    General: Skin is warm and dry.     Findings: No rash.  Neurological:     General: No focal deficit present.     Mental Status: She is alert.  Psychiatric:        Mood and Affect: Mood normal.        Behavior: Behavior normal.     LABORATORY DATA:  CBC    Component Value Date/Time   WBC 5.3 02/02/2023 1324   RBC 3.98 02/02/2023 1324   HGB 11.8 (L) 02/02/2023 1324   HGB 11.9 (L) 11/10/2022 1225   HCT 36.7 02/02/2023 1324   HCT 28.5 (L) 11/20/2018 1824   PLT 172 02/02/2023 1324   PLT 153 11/10/2022  1225   MCV 92.2 02/02/2023 1324   MCH 29.6 02/02/2023 1324   MCHC 32.2 02/02/2023 1324   RDW 15.2 02/02/2023 1324   LYMPHSABS 1.6 02/02/2023 1324   MONOABS 0.6 02/02/2023 1324   EOSABS 0.2 02/02/2023 1324   BASOSABS 0.1 02/02/2023 1324    CMP     Component Value Date/Time   NA 135 02/02/2023 1324   NA 137 12/06/2017 1436   K 3.9 02/02/2023 1324   CL 100 02/02/2023 1324   CO2 26 02/02/2023 1324   GLUCOSE 220 (H) 02/02/2023 1324   BUN 63 (H) 02/02/2023 1324   BUN 20 12/06/2017 1436   CREATININE 2.50 (H) 02/02/2023 1324   CREATININE 2.32 (H) 12/08/2022 1208   CALCIUM 10.0 02/02/2023 1324   PROT 6.3 (L) 02/02/2023 1324   ALBUMIN 4.0 02/02/2023 1324   AST 38 02/02/2023 1324   AST 60 (H) 12/08/2022 1208   ALT 13 02/02/2023 1324   ALT 42 12/08/2022 1208   ALKPHOS 128 (H) 02/02/2023 1324   BILITOT 0.5 02/02/2023 1324   BILITOT 0.7 12/08/2022 1208   GFRNONAA 19 (L) 02/02/2023 1324   GFRNONAA 20 (L) 12/08/2022 1208   GFRAA 32 (L) 03/30/2020 0932      ASSESSMENT and THERAPY PLAN:   Anemia of chronic disease [name] is here for follow-up of her anemia of chronic disease today.  She continues on retacrit 40,000 units every 2 weeks for a hemoglobin less than 11.    Today her hemoglobin is 11.8. She will not receive an injection.  I counseled her to continue to work on her activity level at home  to regain her strength.    We will see [name] back in 2 weeks for labs and an injection; and [name] will return in 4 weeks for labs, f/u with Dr. Al Pimple, and an injection.    All questions were answered. The patient knows to call the clinic with any problems, questions or concerns. We can certainly see the patient much sooner if necessary.  Total encounter time:20 minutes*in face-to-face visit time, chart review, lab review, care coordination, order entry, and documentation of the encounter time.    Lillard Anes, NP 02/05/23 8:18 PM Medical Oncology and Hematology [facility] [address] [place], [place] [address] Tel. [phone]    Fax. [phone]  *Total Encounter Time as defined by the Centers for Medicare and Medicaid Services includes, in addition to the face-to-face time of a patient visit (documented in the note above) non-face-to-face time: obtaining and reviewing outside history, ordering and reviewing medications, tests or procedures, care coordination (communications with other health care professionals or caregivers) and documentation in the medical record.

## 2023-02-05 ENCOUNTER — Encounter: Payer: Self-pay | Admitting: Hematology and Oncology

## 2023-02-05 NOTE — Assessment & Plan Note (Signed)
[name] is here for follow-up of her anemia of chronic disease today.  She continues on retacrit 40,000 units every 2 weeks for a hemoglobin less than 11.    Today her hemoglobin is 11.8. She will not receive an injection.  I counseled her to continue to work on her activity level at home to regain her strength.    We will see [name] back in 2 weeks for labs and an injection; and [name] will return in 4 weeks for labs, f/u with Dr. Al Pimple, and an injection.

## 2023-02-06 ENCOUNTER — Ambulatory Visit: Payer: Medicare HMO | Attending: Cardiology | Admitting: *Deleted

## 2023-02-06 DIAGNOSIS — Z952 Presence of prosthetic heart valve: Secondary | ICD-10-CM | POA: Diagnosis not present

## 2023-02-06 DIAGNOSIS — Z5181 Encounter for therapeutic drug level monitoring: Secondary | ICD-10-CM | POA: Diagnosis not present

## 2023-02-06 LAB — POCT INR: INR: 2.5 (ref 2.0–3.0)

## 2023-02-06 NOTE — Patient Instructions (Signed)
Continue warfarin 1 tablet daily.  Recheck INR in 3 week in [place].

## 2023-02-07 DIAGNOSIS — I272 Pulmonary hypertension, unspecified: Secondary | ICD-10-CM | POA: Diagnosis not present

## 2023-02-07 DIAGNOSIS — D631 Anemia in chronic kidney disease: Secondary | ICD-10-CM | POA: Diagnosis not present

## 2023-02-07 DIAGNOSIS — E1151 Type 2 diabetes mellitus with diabetic peripheral angiopathy without gangrene: Secondary | ICD-10-CM | POA: Diagnosis not present

## 2023-02-07 DIAGNOSIS — E1122 Type 2 diabetes mellitus with diabetic chronic kidney disease: Secondary | ICD-10-CM | POA: Diagnosis not present

## 2023-02-07 DIAGNOSIS — K529 Noninfective gastroenteritis and colitis, unspecified: Secondary | ICD-10-CM | POA: Diagnosis not present

## 2023-02-07 DIAGNOSIS — I110 Hypertensive heart disease with heart failure: Secondary | ICD-10-CM | POA: Diagnosis not present

## 2023-02-07 DIAGNOSIS — I2510 Atherosclerotic heart disease of native coronary artery without angina pectoris: Secondary | ICD-10-CM | POA: Diagnosis not present

## 2023-02-07 DIAGNOSIS — N184 Chronic kidney disease, stage 4 (severe): Secondary | ICD-10-CM | POA: Diagnosis not present

## 2023-02-07 DIAGNOSIS — I5032 Chronic diastolic (congestive) heart failure: Secondary | ICD-10-CM | POA: Diagnosis not present

## 2023-02-08 DIAGNOSIS — N184 Chronic kidney disease, stage 4 (severe): Secondary | ICD-10-CM | POA: Diagnosis not present

## 2023-02-08 DIAGNOSIS — E1122 Type 2 diabetes mellitus with diabetic chronic kidney disease: Secondary | ICD-10-CM | POA: Diagnosis not present

## 2023-02-08 DIAGNOSIS — I110 Hypertensive heart disease with heart failure: Secondary | ICD-10-CM | POA: Diagnosis not present

## 2023-02-08 DIAGNOSIS — I272 Pulmonary hypertension, unspecified: Secondary | ICD-10-CM | POA: Diagnosis not present

## 2023-02-08 DIAGNOSIS — K529 Noninfective gastroenteritis and colitis, unspecified: Secondary | ICD-10-CM | POA: Diagnosis not present

## 2023-02-08 DIAGNOSIS — E1151 Type 2 diabetes mellitus with diabetic peripheral angiopathy without gangrene: Secondary | ICD-10-CM | POA: Diagnosis not present

## 2023-02-08 DIAGNOSIS — I2510 Atherosclerotic heart disease of native coronary artery without angina pectoris: Secondary | ICD-10-CM | POA: Diagnosis not present

## 2023-02-08 DIAGNOSIS — I5032 Chronic diastolic (congestive) heart failure: Secondary | ICD-10-CM | POA: Diagnosis not present

## 2023-02-08 DIAGNOSIS — D631 Anemia in chronic kidney disease: Secondary | ICD-10-CM | POA: Diagnosis not present

## 2023-02-09 DIAGNOSIS — N184 Chronic kidney disease, stage 4 (severe): Secondary | ICD-10-CM | POA: Diagnosis not present

## 2023-02-09 DIAGNOSIS — I5032 Chronic diastolic (congestive) heart failure: Secondary | ICD-10-CM | POA: Diagnosis not present

## 2023-02-09 DIAGNOSIS — I2510 Atherosclerotic heart disease of native coronary artery without angina pectoris: Secondary | ICD-10-CM | POA: Diagnosis not present

## 2023-02-09 DIAGNOSIS — D631 Anemia in chronic kidney disease: Secondary | ICD-10-CM | POA: Diagnosis not present

## 2023-02-09 DIAGNOSIS — I272 Pulmonary hypertension, unspecified: Secondary | ICD-10-CM | POA: Diagnosis not present

## 2023-02-09 DIAGNOSIS — E1151 Type 2 diabetes mellitus with diabetic peripheral angiopathy without gangrene: Secondary | ICD-10-CM | POA: Diagnosis not present

## 2023-02-09 DIAGNOSIS — E1122 Type 2 diabetes mellitus with diabetic chronic kidney disease: Secondary | ICD-10-CM | POA: Diagnosis not present

## 2023-02-09 DIAGNOSIS — I110 Hypertensive heart disease with heart failure: Secondary | ICD-10-CM | POA: Diagnosis not present

## 2023-02-09 DIAGNOSIS — K529 Noninfective gastroenteritis and colitis, unspecified: Secondary | ICD-10-CM | POA: Diagnosis not present

## 2023-02-12 DIAGNOSIS — D631 Anemia in chronic kidney disease: Secondary | ICD-10-CM | POA: Diagnosis not present

## 2023-02-12 DIAGNOSIS — I110 Hypertensive heart disease with heart failure: Secondary | ICD-10-CM | POA: Diagnosis not present

## 2023-02-12 DIAGNOSIS — I2510 Atherosclerotic heart disease of native coronary artery without angina pectoris: Secondary | ICD-10-CM | POA: Diagnosis not present

## 2023-02-12 DIAGNOSIS — E1151 Type 2 diabetes mellitus with diabetic peripheral angiopathy without gangrene: Secondary | ICD-10-CM | POA: Diagnosis not present

## 2023-02-12 DIAGNOSIS — E1122 Type 2 diabetes mellitus with diabetic chronic kidney disease: Secondary | ICD-10-CM | POA: Diagnosis not present

## 2023-02-12 DIAGNOSIS — K529 Noninfective gastroenteritis and colitis, unspecified: Secondary | ICD-10-CM | POA: Diagnosis not present

## 2023-02-12 DIAGNOSIS — I272 Pulmonary hypertension, unspecified: Secondary | ICD-10-CM | POA: Diagnosis not present

## 2023-02-12 DIAGNOSIS — I5032 Chronic diastolic (congestive) heart failure: Secondary | ICD-10-CM | POA: Diagnosis not present

## 2023-02-12 DIAGNOSIS — N184 Chronic kidney disease, stage 4 (severe): Secondary | ICD-10-CM | POA: Diagnosis not present

## 2023-02-13 DIAGNOSIS — I5032 Chronic diastolic (congestive) heart failure: Secondary | ICD-10-CM | POA: Diagnosis not present

## 2023-02-13 DIAGNOSIS — I7782 Antineutrophilic cytoplasmic antibody (ANCA) vasculitis: Secondary | ICD-10-CM | POA: Diagnosis not present

## 2023-02-13 DIAGNOSIS — I130 Hypertensive heart and chronic kidney disease with heart failure and stage 1 through stage 4 chronic kidney disease, or unspecified chronic kidney disease: Secondary | ICD-10-CM | POA: Diagnosis not present

## 2023-02-13 DIAGNOSIS — D649 Anemia, unspecified: Secondary | ICD-10-CM | POA: Diagnosis not present

## 2023-02-13 DIAGNOSIS — K219 Gastro-esophageal reflux disease without esophagitis: Secondary | ICD-10-CM | POA: Diagnosis not present

## 2023-02-13 DIAGNOSIS — K529 Noninfective gastroenteritis and colitis, unspecified: Secondary | ICD-10-CM | POA: Diagnosis not present

## 2023-02-13 DIAGNOSIS — N184 Chronic kidney disease, stage 4 (severe): Secondary | ICD-10-CM | POA: Diagnosis not present

## 2023-02-13 DIAGNOSIS — E1151 Type 2 diabetes mellitus with diabetic peripheral angiopathy without gangrene: Secondary | ICD-10-CM | POA: Diagnosis not present

## 2023-02-14 DIAGNOSIS — E1151 Type 2 diabetes mellitus with diabetic peripheral angiopathy without gangrene: Secondary | ICD-10-CM | POA: Diagnosis not present

## 2023-02-14 DIAGNOSIS — I2510 Atherosclerotic heart disease of native coronary artery without angina pectoris: Secondary | ICD-10-CM | POA: Diagnosis not present

## 2023-02-14 DIAGNOSIS — K529 Noninfective gastroenteritis and colitis, unspecified: Secondary | ICD-10-CM | POA: Diagnosis not present

## 2023-02-14 DIAGNOSIS — N184 Chronic kidney disease, stage 4 (severe): Secondary | ICD-10-CM | POA: Diagnosis not present

## 2023-02-14 DIAGNOSIS — I5032 Chronic diastolic (congestive) heart failure: Secondary | ICD-10-CM | POA: Diagnosis not present

## 2023-02-14 DIAGNOSIS — I272 Pulmonary hypertension, unspecified: Secondary | ICD-10-CM | POA: Diagnosis not present

## 2023-02-14 DIAGNOSIS — D631 Anemia in chronic kidney disease: Secondary | ICD-10-CM | POA: Diagnosis not present

## 2023-02-14 DIAGNOSIS — I110 Hypertensive heart disease with heart failure: Secondary | ICD-10-CM | POA: Diagnosis not present

## 2023-02-14 DIAGNOSIS — E1122 Type 2 diabetes mellitus with diabetic chronic kidney disease: Secondary | ICD-10-CM | POA: Diagnosis not present

## 2023-02-15 DIAGNOSIS — N184 Chronic kidney disease, stage 4 (severe): Secondary | ICD-10-CM | POA: Diagnosis not present

## 2023-02-15 DIAGNOSIS — I2510 Atherosclerotic heart disease of native coronary artery without angina pectoris: Secondary | ICD-10-CM | POA: Diagnosis not present

## 2023-02-15 DIAGNOSIS — D631 Anemia in chronic kidney disease: Secondary | ICD-10-CM | POA: Diagnosis not present

## 2023-02-15 DIAGNOSIS — I5032 Chronic diastolic (congestive) heart failure: Secondary | ICD-10-CM | POA: Diagnosis not present

## 2023-02-15 DIAGNOSIS — I272 Pulmonary hypertension, unspecified: Secondary | ICD-10-CM | POA: Diagnosis not present

## 2023-02-15 DIAGNOSIS — Z515 Encounter for palliative care: Secondary | ICD-10-CM | POA: Diagnosis not present

## 2023-02-15 DIAGNOSIS — E1122 Type 2 diabetes mellitus with diabetic chronic kidney disease: Secondary | ICD-10-CM | POA: Diagnosis not present

## 2023-02-15 DIAGNOSIS — E1151 Type 2 diabetes mellitus with diabetic peripheral angiopathy without gangrene: Secondary | ICD-10-CM | POA: Diagnosis not present

## 2023-02-15 DIAGNOSIS — I110 Hypertensive heart disease with heart failure: Secondary | ICD-10-CM | POA: Diagnosis not present

## 2023-02-15 DIAGNOSIS — K529 Noninfective gastroenteritis and colitis, unspecified: Secondary | ICD-10-CM | POA: Diagnosis not present

## 2023-02-16 ENCOUNTER — Other Ambulatory Visit: Payer: Self-pay

## 2023-02-16 ENCOUNTER — Inpatient Hospital Stay: Payer: Medicare HMO | Attending: Oncology

## 2023-02-16 ENCOUNTER — Inpatient Hospital Stay: Payer: Medicare HMO

## 2023-02-16 VITALS — BP 148/59 | HR 62 | Temp 98.3°F | Resp 18

## 2023-02-16 DIAGNOSIS — D500 Iron deficiency anemia secondary to blood loss (chronic): Secondary | ICD-10-CM

## 2023-02-16 DIAGNOSIS — D638 Anemia in other chronic diseases classified elsewhere: Secondary | ICD-10-CM

## 2023-02-16 DIAGNOSIS — D631 Anemia in chronic kidney disease: Secondary | ICD-10-CM | POA: Insufficient documentation

## 2023-02-16 DIAGNOSIS — D591 Autoimmune hemolytic anemia, unspecified: Secondary | ICD-10-CM | POA: Diagnosis not present

## 2023-02-16 DIAGNOSIS — I7782 Antineutrophilic cytoplasmic antibody (ANCA) vasculitis: Secondary | ICD-10-CM | POA: Diagnosis not present

## 2023-02-16 DIAGNOSIS — M8580 Other specified disorders of bone density and structure, unspecified site: Secondary | ICD-10-CM | POA: Insufficient documentation

## 2023-02-16 DIAGNOSIS — N184 Chronic kidney disease, stage 4 (severe): Secondary | ICD-10-CM | POA: Diagnosis not present

## 2023-02-16 LAB — CBC WITH DIFFERENTIAL/PLATELET
Abs Immature Granulocytes: 0.02 10*3/uL (ref 0.00–0.07)
Basophils Absolute: 0.1 10*3/uL (ref 0.0–0.1)
Basophils Relative: 1 %
Eosinophils Absolute: 0.4 10*3/uL (ref 0.0–0.5)
Eosinophils Relative: 8 %
HCT: 29.6 % — ABNORMAL LOW (ref 36.0–46.0)
Hemoglobin: 9.8 g/dL — ABNORMAL LOW (ref 12.0–15.0)
Immature Granulocytes: 0 %
Lymphocytes Relative: 26 %
Lymphs Abs: 1.3 10*3/uL (ref 0.7–4.0)
MCH: 30.2 pg (ref 26.0–34.0)
MCHC: 33.1 g/dL (ref 30.0–36.0)
MCV: 91.4 fL (ref 80.0–100.0)
Monocytes Absolute: 0.4 10*3/uL (ref 0.1–1.0)
Monocytes Relative: 9 %
Neutro Abs: 2.8 10*3/uL (ref 1.7–7.7)
Neutrophils Relative %: 56 %
Platelets: 106 10*3/uL — ABNORMAL LOW (ref 150–400)
RBC: 3.24 MIL/uL — ABNORMAL LOW (ref 3.87–5.11)
RDW: 15.2 % (ref 11.5–15.5)
WBC: 5 10*3/uL (ref 4.0–10.5)
nRBC: 0 % (ref 0.0–0.2)

## 2023-02-16 LAB — COMPREHENSIVE METABOLIC PANEL
ALT: 17 U/L (ref 0–44)
AST: 35 U/L (ref 15–41)
Albumin: 3.8 g/dL (ref 3.5–5.0)
Alkaline Phosphatase: 106 U/L (ref 38–126)
Anion gap: 8 (ref 5–15)
BUN: 93 mg/dL — ABNORMAL HIGH (ref 8–23)
CO2: 25 mmol/L (ref 22–32)
Calcium: 9.1 mg/dL (ref 8.9–10.3)
Chloride: 100 mmol/L (ref 98–111)
Creatinine, Ser: 2.37 mg/dL — ABNORMAL HIGH (ref 0.44–1.00)
GFR, Estimated: 20 mL/min — ABNORMAL LOW (ref >60)
Glucose, Bld: 197 mg/dL — ABNORMAL HIGH (ref 70–99)
Potassium: 4.2 mmol/L (ref 3.5–5.1)
Sodium: 133 mmol/L — ABNORMAL LOW (ref 135–145)
Total Bilirubin: 0.4 mg/dL (ref 0.3–1.2)
Total Protein: 5.9 g/dL — ABNORMAL LOW (ref 6.5–8.1)

## 2023-02-16 LAB — SAMPLE TO BLOOD BANK

## 2023-02-16 MED ORDER — EPOETIN ALFA-EPBX 40000 UNIT/ML IJ SOLN
40000.0000 [IU] | Freq: Once | INTRAMUSCULAR | Status: AC
  Administered 2023-02-16: 40000 [IU] via SUBCUTANEOUS
  Filled 2023-02-16: qty 1

## 2023-02-20 DIAGNOSIS — N184 Chronic kidney disease, stage 4 (severe): Secondary | ICD-10-CM | POA: Diagnosis not present

## 2023-02-20 DIAGNOSIS — I5032 Chronic diastolic (congestive) heart failure: Secondary | ICD-10-CM | POA: Diagnosis not present

## 2023-02-20 DIAGNOSIS — E1151 Type 2 diabetes mellitus with diabetic peripheral angiopathy without gangrene: Secondary | ICD-10-CM | POA: Diagnosis not present

## 2023-02-20 DIAGNOSIS — E1122 Type 2 diabetes mellitus with diabetic chronic kidney disease: Secondary | ICD-10-CM | POA: Diagnosis not present

## 2023-02-20 DIAGNOSIS — K529 Noninfective gastroenteritis and colitis, unspecified: Secondary | ICD-10-CM | POA: Diagnosis not present

## 2023-02-20 DIAGNOSIS — I110 Hypertensive heart disease with heart failure: Secondary | ICD-10-CM | POA: Diagnosis not present

## 2023-02-20 DIAGNOSIS — D631 Anemia in chronic kidney disease: Secondary | ICD-10-CM | POA: Diagnosis not present

## 2023-02-20 DIAGNOSIS — I2510 Atherosclerotic heart disease of native coronary artery without angina pectoris: Secondary | ICD-10-CM | POA: Diagnosis not present

## 2023-02-20 DIAGNOSIS — I272 Pulmonary hypertension, unspecified: Secondary | ICD-10-CM | POA: Diagnosis not present

## 2023-02-22 ENCOUNTER — Ambulatory Visit: Payer: Self-pay

## 2023-02-22 NOTE — Patient Instructions (Signed)
Visit Information  Thank you for taking time to visit with me today. Please don't hesitate to contact me if I can be of assistance to you.   Following are the goals we discussed today:   Goals Addressed               This Visit's Progress     Patient Stated     I need help knowing how much protein to eat/drink (pt-stated)        Care Coordination Interventions: Determined patient is staying well hydrated, although daughter has noticed a jump in blood glucose levels this week, reports highest was in 300's one afternoon Educated daughter regarding hyperglycemic events and infection, daughter will review signs/symptoms of UTI with patient due to having a history of recurrent UTI Educated daughter regarding the importance of notifying patient's doctor promptly if UTI is suspected in order to receive early diagnosis/treatment  Discussed patient has been eating 1 cupcake each afternoon since Mother's Day, educated daughter regarding the effects deserts having on blood sugars and she verbalizes understanding  Reviewed and discussed most current A1c was at 5.6 % Instructed daughter to keep patient's PCP well informed of persistent abnormal CBG's Last practice recorded BP readings:  BP Readings from Last 3 Encounters:  02/16/23 (!) 148/59  02/02/23 (!) 138/47  01/26/23 (!) 143/52  Most recent eGFR/CrCl: No results found for: "EGFR"  No components found for: "CRCL" Lab Results  Component Value Date   HGBA1C 5.6 12/24/2022        Other     RN Care Coordination Activities: further follow up needed        Care Coordination Interventions: Evaluation of current treatment plan related to bilateral edema to lower extremities and patient's adherence to plan as established by provider Spoke with patient's daughter [name] Determined patient is progressing toward wellness, she completed PT and is now working with OT, ambulatory with use of her walker w/o issues or falls Discussed daughter  try's to promote independence and encourage patient to perform self care as able  Reviewed and discussed completed follow up MD visits as instructed upon discharge Reviewed upcoming scheduled Echocardiogram and f/u with Dr. Shirlee Latch scheduled for tomorrow, 02/23/23        Our next appointment is by telephone on 03/27/23 at 10:30 AM  Please call the care guide team at [phone] if you need to cancel or reschedule your appointment.   If you are experiencing a Mental Health or Behavioral Health Crisis or need someone to talk to, please call 1-800-273-TALK (toll free, 24 hour hotline) go to [facility] [address], [place] [phone])  Patient verbalizes understanding of instructions and care plan provided today and agrees to view in MyChart. Active MyChart status and patient understanding of how to access instructions and care plan via MyChart confirmed with patient.     Delsa Sale, RN, BSN, CCM Care Management Coordinator [facility] Care Management  Direct Phone: [phone] \

## 2023-02-22 NOTE — Patient Outreach (Signed)
Care Coordination   Follow Up Visit Note   02/22/2023 Name: [name] MRN: [id number] DOB: December 02, 1940  [name] is a 83 y.o. year old female who sees [name], [name], MD for primary care. I spoke with daughter [name] by phone today.  What matters to the patients health and wellness today?  Daughter would like to promote self care and independence for her mom.     Goals Addressed               This Visit's Progress     Patient Stated     I need help knowing how much protein to eat/drink (pt-stated)        Care Coordination Interventions: Determined patient is staying well hydrated, although daughter has noticed a jump in blood glucose levels this week, reports highest was in 300's one afternoon Educated daughter regarding hyperglycemic events and infection, daughter will review signs/symptoms of UTI with patient due to having a history of recurrent UTI Educated daughter regarding the importance of notifying patient's doctor promptly if UTI is suspected in order to receive early diagnosis/treatment  Discussed patient has been eating 1 cupcake each afternoon since Mother's Day, educated daughter regarding the effects deserts having on blood sugars and she verbalizes understanding  Reviewed and discussed most current A1c was at 5.6 % Instructed daughter to keep patient's PCP well informed of persistent abnormal CBG's Last practice recorded BP readings:  BP Readings from Last 3 Encounters:  02/16/23 (!) 148/59  02/02/23 (!) 138/47  01/26/23 (!) 143/52  Most recent eGFR/CrCl: No results found for: "EGFR"  No components found for: "CRCL" Lab Results  Component Value Date   HGBA1C 5.6 12/24/2022        Other     RN Care Coordination Activities: further follow up needed        Care Coordination Interventions: Evaluation of current treatment plan related to bilateral edema to lower extremities and patient's adherence to plan as established by provider Spoke  with patient's daughter [name] Determined patient is progressing toward wellness, she completed PT and is now working with OT, ambulatory with use of her walker w/o issues or falls Discussed daughter try's to promote independence and encourage patient to perform self care as able  Reviewed and discussed completed follow up MD visits as instructed upon discharge Reviewed upcoming scheduled Echocardiogram and f/u with Dr. Shirlee Latch scheduled for tomorrow, 02/23/23    Interventions Today    Flowsheet Row Most Recent Value  Chronic Disease   Chronic disease during today's visit Diabetes, Chronic Kidney Disease/End Stage Renal Disease (ESRD)  [iron deficiency anemia]  General Interventions   General Interventions Discussed/Reviewed General Interventions Discussed, General Interventions Reviewed, Labs, Doctor Visits, Durable Medical Equipment (DME)  Doctor Visits Discussed/Reviewed Doctor Visits Discussed, Doctor Visits Reviewed, PCP, Specialist  Durable Medical Equipment (DME) Dan Humphreys  Exercise Interventions   Exercise Discussed/Reviewed Exercise Reviewed, Exercise Discussed, Physical Activity  Physical Activity Discussed/Reviewed Home Exercise Program (HEP), Physical Activity Discussed, Physical Activity Reviewed  Education Interventions   Education Provided Provided Education  Provided Verbal Education On Labs, When to see the doctor  Labs Reviewed Hgb A1c  Nutrition Interventions   Nutrition Discussed/Reviewed Nutrition Discussed, Nutrition Reviewed, Increaing proteins, Fluid intake  Safety Interventions   Safety Discussed/Reviewed Safety Reviewed, Safety Discussed          SDOH assessments and interventions completed:  No     Care Coordination Interventions:  Yes, provided   Follow up plan: Follow  up call scheduled for 03/27/23 @10 :30 AM    Encounter Outcome:  Pt. Visit Completed

## 2023-02-23 ENCOUNTER — Ambulatory Visit (HOSPITAL_COMMUNITY)
Admission: RE | Admit: 2023-02-23 | Discharge: 2023-02-23 | Disposition: A | Discharge status: Home / Self Care | Payer: Medicare HMO | Source: Ambulatory Visit | Attending: Internal Medicine | Admitting: Internal Medicine

## 2023-02-23 ENCOUNTER — Encounter (HOSPITAL_COMMUNITY): Payer: Self-pay

## 2023-02-23 ENCOUNTER — Ambulatory Visit (HOSPITAL_BASED_OUTPATIENT_CLINIC_OR_DEPARTMENT_OTHER)
Admission: RE | Admit: 2023-02-23 | Discharge: 2023-02-23 | Disposition: A | Discharge status: Home / Self Care | Payer: Medicare HMO | Source: Ambulatory Visit | Attending: Physician Assistant | Admitting: Physician Assistant

## 2023-02-23 VITALS — BP 138/44 | HR 62 | Wt 109.0 lb

## 2023-02-23 DIAGNOSIS — I083 Combined rheumatic disorders of mitral, aortic and tricuspid valves: Secondary | ICD-10-CM | POA: Diagnosis not present

## 2023-02-23 DIAGNOSIS — E1122 Type 2 diabetes mellitus with diabetic chronic kidney disease: Secondary | ICD-10-CM | POA: Insufficient documentation

## 2023-02-23 DIAGNOSIS — I130 Hypertensive heart and chronic kidney disease with heart failure and stage 1 through stage 4 chronic kidney disease, or unspecified chronic kidney disease: Secondary | ICD-10-CM | POA: Insufficient documentation

## 2023-02-23 DIAGNOSIS — N184 Chronic kidney disease, stage 4 (severe): Secondary | ICD-10-CM | POA: Insufficient documentation

## 2023-02-23 DIAGNOSIS — I2510 Atherosclerotic heart disease of native coronary artery without angina pectoris: Secondary | ICD-10-CM | POA: Insufficient documentation

## 2023-02-23 DIAGNOSIS — Z952 Presence of prosthetic heart valve: Secondary | ICD-10-CM | POA: Diagnosis not present

## 2023-02-23 DIAGNOSIS — I272 Pulmonary hypertension, unspecified: Secondary | ICD-10-CM | POA: Diagnosis not present

## 2023-02-23 DIAGNOSIS — I4811 Longstanding persistent atrial fibrillation: Secondary | ICD-10-CM | POA: Diagnosis not present

## 2023-02-23 DIAGNOSIS — I5032 Chronic diastolic (congestive) heart failure: Secondary | ICD-10-CM

## 2023-02-23 LAB — ECHOCARDIOGRAM COMPLETE
AR max vel: 0.63 cm2
AV Area VTI: 0.65 cm2
AV Area mean vel: 0.7 cm2
AV Mean grad: 22 mmHg
AV Peak grad: 42 mmHg
Ao pk vel: 3.24 m/s
Area-P 1/2: 4.57 cm2
Calc EF: 76.8 %
MV VTI: 1.35 cm2
S' Lateral: 3.1 cm
Single Plane A2C EF: 74.1 %
Single Plane A4C EF: 77.3 %

## 2023-02-23 NOTE — Patient Instructions (Addendum)
Thank you for coming in today  If you had labs drawn today, any labs that are abnormal the clinic will call you No news is good news  Medications: No changes  Follow up appointments:  Your physician recommends that you schedule a follow-up appointment in:     Do the following things EVERYDAY: Weigh yourself in the morning before breakfast. Write it down and keep it in a log. Take your medicines as prescribed Eat low salt foods--Limit salt (sodium) to 2000 mg per day.  Stay as active as you can everyday Limit all fluids for the day to less than 2 liters   At the [facility], you and your health needs are our priority. As part of our continuing mission to provide you with exceptional heart care, we have created designated Provider Care Teams. These Care Teams include your primary Cardiologist (physician) and Advanced Practice Providers (APPs- Physician Assistants and Nurse Practitioners) who all work together to provide you with the care you need, when you need it.   You may see any of the following providers on your designated Care Team at your next follow up: Dr Arvilla Meres Dr Marca Ancona Dr. Marcos Eke, NP Robbie Lis, [place] [facility] [place], [place] Brynda Peon, NP Karle Plumber, PharmD   Please be sure to bring in all your medications bottles to every appointment.    Thank you for choosing [facility]  If you have any questions or concerns before your next appointment please send [place] a message through [place] or call our office at [phone].    TO LEAVE A MESSAGE FOR THE NURSE SELECT OPTION 2, PLEASE LEAVE A MESSAGE INCLUDING: YOUR NAME DATE OF BIRTH CALL BACK NUMBER REASON FOR CALL**this is important as we prioritize the call backs  YOU WILL RECEIVE A CALL BACK THE SAME DAY AS LONG AS YOU CALL BEFORE 4:00 PM

## 2023-02-23 NOTE — Progress Notes (Addendum)
Date:  02/23/2023   ID:  VIRIGINIA [name], DOB [birth year], MRN [id number]    Provider location: [facility] Type of Visit: Established patient   PCP:  Gaspar Garbe, MD  Cardiologist:  Nanetta Batty, MD Primary HF: Dr. Shirlee Latch   History of Present Illness: [name] is a 82 y.o. female who has a history of PAD, CAD s/p CABG, mechanical AVR, pulmonary hypertension, and chronic diastolic CHF with prominent RV dysfunction. Patient had CABG-AVR in 10/05.  She has been in chronic atrial fibrillation, it appears, since at least 4/17.  She has significant PAD followed by Dr. Allyson Sabal.     She was admitted in 5/19 with dyspnea and marked volume overloaded. She was started on Lasix and it was titrated up.  Echo showed EF 55-60% with moderate RV dilation, severe RV systolic dysfunction, and PASP 88 mmHg.  RHC in 9/19 showed moderate pulmonary hypertension with near normal filling pressures. PVR was not particularly high at 3.1 WU primarily due to elevated cardiac output. V/Q scan did not show chronic PEs and PFTs were unremarkable. She is on Adcirca.    She was admitted in 2/20 with upper GI bleeding, found to have gastric ulcers.  She had a CT chest in 3/20 with subtle bilateral ground glass opacities.  Referred to pulmonary.   She was admitted again in 6/20 with a GI bleed and required transfusion.   She was seen by pulmonary in 7/20, PFTs showed no obstruction/restriction but decreased DLCO.    She was seen by rheumatology due to concern for rheumatoid arthritis.  She was thought to have gout and OA but no RA.   Echo was done in 7/20 showing  EF 60-65% with mild LVH, normal RV, mechanical aortic valve with mean gradient 17 mmHg, PASP 34 mmHg.   In 8/20, she was admitted with CHF/pneumonitis and pancytopenia.  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  She has been treated with rituximab and prednisone, now off.    Echo 4/21 showed EF 65-70%, normal RV, moderate TR, mechanical aortic valve with mean gradient 20 mmHg, PASP 36 mmHg.   Had echo 4/22 w/ stable findings. LVEF 65-70% w/ GIIIDD, prior mechanical AVR stable w/ gradient similar to prior study (mean gradient 18 mmHg). RV normal w/ mildly elevated RVSP at 30.5.   Echo in 8/23 showed EF 55-60%, mild LVH, normal RV, PASP 42 mmHg, mild MR, mild-moderate TR, mechanical St Jude aortic valve with mean gradient 26 mmHg (up from 21 mmHg).   She was admitted in 11/23 with AKI on CKD stage 4, she was initially given IV fluid then Lasix when she became volume overloaded.  Thought to be poor HD candidate due to frailty.   Admit 03/24 with a/c anemia with concern for GI bleed and sepsis secondary to colitis. Declined endoscopic evaluation for anemia. Given 1 u RBCS. Treated with abx. Coursc/b urinary retention and AKI on CKD (Scr peaked at 2.8).   Admitted 04/24 with acute hypoxic respiratory failure 2/2 PNA and AKI. She was resuscitated with IVF and did require several doses of IV lasix.   She returns today for followup of CHF.  Patient lives at home with her daughter. Arrived in wheelchair today, uses walker at home. Completed home PT, has another week of OT.  Able to walk around the house but has a hard time standing for longer periods. No dyspnea, orthopnea or PND. Had quite  a bit of LE edema but this has improved with leg elevation and ACE wraps. Weight 105 lb at discharge, 109 lb today. Admits she probably eats more sodium than she should at times, eats out for lunch when she has appointments.    Labs (8/19): K 4.2, creatinine 0.8, LDL 56 Labs (9/19): K 4.3, Na 129, creatinine 0.87, hgb 9.8 Labs (10/19): ANA+, RF 36.8 (elevated), SCL-70 negative, anti-centromere Ab negative, ds-DNA negative.  Labs (12/19): K 4, creatinine 1.09 Labs (1/20): CCP Ab elevated 115 Labs (2/20): K 4.1, creatinine 1.24 Labs (7/20): K 4.2, creatinine 1.99, hgb 6.5  (transfused) Labs (11/20): K 3.5, creatinine 1.64 Labs (12/20): LDL 60 Labs (5/21): K 4.4, creatinine 1.85 => 2.66 Labs (03/30/20): Creatinine 1.7  Labs (1/24): K 4.1, creatinine 2.35, hgb 9.5   6 min walk (10/19): 189 m 6 min walk (1/20): 183 m   PMH: 1. Mechanical St Jude aortic valve 10/05: Goal INR 2.5-3.5.  2. CAD: s/p CABG x 2 with AVR in 10/05.  - Cardiolite in 2014 showed no ischemia.  3. HTN 4. Hyperlipidemia 5. Type 2 diabetes 6. PAD: Peripheral arterial dopplers (7/19) with >50% CIA stenosis on right, 70-99% CIA stenosis on the left.  7. H/o CCY 8. Atrial fibrillation: Long-standing persistent.  9. Chronic diastolic CHF with prominent RV dysfunction: Echo (5/19) with EF 55-60%, grade 3 diastolic dysfunction, mechanical aortic valve with mean gradient 10 mmHg, mild MR, moderate RV dilation with moderate-severely decreased RV systolic function, biatrial enlargement, PASP 88 mmHg.  - Echo (7/20): EF 60-65% with mild LVH, normal RV, mechanical aortic valve with mean gradient 17 mmHg, PASP 34 mmHg.  - Echo (4/21): EF 65-70%, normal RV, moderate TR, mechanical aortic valve with mean gradient 20 mmHg, PASP 36 mmHg.  - Echo (8/23): EF 55-60%, mild LVH, normal RV, PASP 42 mmHg, mild MR, mild-moderate TR, mechanical St Jude aortic valve with mean gradient 26 mmHg (up from 21 mmHg).  10. Carotid stenosis: 40-59% BICA stenosis on 7/19 carotid dopplers.  - Carotid dopplers (5/21): 40-59% BICA stenosis.  11. Pulmonary hypertension: RHC (9/19) with mean RA 5, PA 63/17 mean 36, mean PCWP 16, CI 4.16, PVR 3.1 WU.  - V/Q scan (10/19): no evidence for chronic PE.  - PFTs (10/19): minimal obstruction.  - ANA+, RF 36.8 (elevated), SCL-70 negative, anti-centromere Ab negative, ds-DNA negative, CCP Ab positive. She saw rheumatology, NOT thought to have RA.  - Echo in 7/20 with normal-appearing RV and PASP 34 mmHg.  - PFTs (7/20) with no significant obstruction/restriction but decreased DLCO.  -  PYP scan not suggestive of TTR amyloidosis.  - Echo (4/21): EF 65-70%, normal RV, moderate TR, mechanical aortic valve with mean gradient 20 mmHg, PASP 36 mmHg.  - Echo (8/23): EF 55-60%, mild LVH, normal RV, PASP 42 mmHg, mild MR, mild-moderate TR, mechanical St Jude aortic valve with mean gradient 26 mmHg (up from 21 mmHg).  12. 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 History of GI bleeding.  15. 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  - Treated with Rituximab and prednisone.  16. CKD: stage 4.   Current Outpatient Medications  Medication Sig Dispense Refill   acetaminophen (TYLENOL) 325 MG tablet Take 325 mg by mouth every 6 (six) hours as needed for mild pain or fever.     allopurinol (ZYLOPRIM) 100 MG tablet Take 100 mg by mouth daily.     ALPRAZolam (XANAX) 0.5 MG tablet Take 0.5 mg by mouth daily.  amLODipine (NORVASC) 10 MG tablet Take 1 tablet (10 mg total) by mouth daily. 90 tablet 3   atorvastatin (LIPITOR) 80 MG tablet Take 1 tablet (80 mg total) by mouth daily at 6 PM.     budesonide (PULMICORT) 0.5 MG/2ML nebulizer solution Take 2 mLs (0.5 mg total) by nebulization 2 (two) times daily. 120 mL 1   Cholecalciferol (VITAMIN D3) 50 MCG (2000 UT) capsule Take 2,000 Units by mouth daily with lunch.     cloNIDine (CATAPRES) 0.2 MG tablet Take 0.2 mg by mouth 2 (two) times daily.     Cyanocobalamin (VITAMIN B12) 1000 MCG TBCR Take 1,000 mcg by mouth daily with lunch.     famotidine (PEPCID) 20 MG tablet Take 20 mg by mouth 2 (two) times daily.     ferrous sulfate 325 (65 FE) MG tablet Take 1 tablet (325 mg total) by mouth daily with breakfast. (Patient taking differently: Patient takes 2 tablets by mouth daily.)  3   furosemide (LASIX) 40 MG tablet Patient takes 2 tablets by mouth in the morning and 1.5 tablets at night.     hydrALAZINE (APRESOLINE) 100 MG tablet Take 1 tablet (100 mg total) by mouth 3 (three) times daily. 270  tablet 3   insulin aspart (NOVOLOG) 100 UNIT/ML injection Inject 2-8 Units into the skin 3 (three) times daily with meals. Per sliding scale, If blood sugar is 200 to 250, give 2 units. If blood sugar is 251 to 300, give 4 units. If blood sugar is 301 to 350, give 6 units. If blood sugar is greater than 350, give 8 units and call MD.     Insulin Syringe-Needle U-100 (B-D INS SYR ULTRAFINE .3CC/29G) 29G X 1/2" 0.3 ML MISC Use for sliding scale for breakthrough high blood sugar readings 100 each 1   ipratropium-albuterol (DUONEB) 0.5-2.5 (3) MG/3ML SOLN Take 3 mLs by nebulization every 6 (six) hours as needed. 360 mL 0   metoprolol succinate (TOPROL-XL) 25 MG 24 hr tablet Take 1 tablet (25 mg total) by mouth daily. 30 tablet 3   ondansetron (ZOFRAN-ODT) 4 MG disintegrating tablet Take 4 mg by mouth every 8 (eight) hours as needed for nausea or vomiting.     pantoprazole (PROTONIX) 40 MG tablet Take 1 tablet (40 mg total) by mouth 2 (two) times daily. 60 tablet 2   senna-docusate (SENOKOT-S) 8.6-50 MG tablet Take 2 tablets by mouth at bedtime. 60 tablet 2   sertraline (ZOLOFT) 25 MG tablet Take 25 mg by mouth daily.     simethicone (GAS-X) 80 MG chewable tablet Chew 1 tablet (80 mg total) by mouth 4 (four) times daily as needed for flatulence. 100 tablet 2   tadalafil, PAH, (ADCIRCA) 20 MG tablet TAKE 2 TABLETS BY MOUTH ONCE DAILY 60 tablet 11   warfarin (COUMADIN) 5 MG tablet Take 1 tablet (5 mg total) by mouth See admin instructions. Take 1 tablet 5mg  on Tuesday/Thursday/Sunday and 1/2 Tab (2.5 mg) on MWFSat (Patient taking differently: Take 5 mg by mouth See admin instructions. Take 5 mg once daily. Pt's has not been checked in over a week. Per daughter)     No current facility-administered medications for this encounter.    Allergies:   Augmentin [amoxicillin-pot clavulanate], Ciprofloxacin, Codeine, Coreg [carvedilol], Diflucan [fluconazole], Levofloxacin in d5w, Losartan, Privigen [immune  globulin (human)], Sulfamethoxazole-trimethoprim, Verapamil, Zetia [ezetimibe], Zocor [simvastatin - high dose], and Gatifloxacin   Social History:  The patient  reports that she has quit smoking. Her smoking use included  cigarettes. She has a 30.00 pack-year smoking history. She has never used smokeless tobacco. She reports that she does not drink alcohol and does not use drugs.   Family History:  [name] family history includes Heart disease in her father; Hyperlipidemia in her father; Hypertension in her father.   ROS:  Please see the history of present illness.   All other systems are personally reviewed and negative.   Exam:   BP (!) 138/44   Pulse 62   Wt 49.4 kg (109 lb)   SpO2 97%   BMI 22.78 kg/m     General:  Frail elderly female in wheelchair HEENT: normal Neck: supple. JVP ~ 8 cm. Carotids 2+ bilat; no bruits.  Cor: PMI nondisplaced. Regular rate & rhythm. Mechanical S2. No rubs, gallops 2/6 AS murmur. Lungs: clear Abdomen: soft, nontender, nondistended.  Extremities: no cyanosis, clubbing, rash, 1+ edema, + compression wraps Neuro: alert & orientedx3. moves all 4 extremities w/o difficulty. Affect pleasant   Recent Labs: 10/26/2022: B Natriuretic Peptide 390.1 01/26/2023: Magnesium 2.0 02/16/2023: ALT 17; BUN 93; Creatinine, Ser 2.37; Hemoglobin 9.8; Platelets 106; Potassium 4.2; Sodium 133  Personally reviewed   Wt Readings from Last 3 Encounters:  02/23/23 49.4 kg (109 lb)  02/02/23 47.7 kg (105 lb 1.6 oz)  01/13/23 56 kg (123 lb 7.3 oz)     ASSESSMENT AND PLAN:  1. Atrial fibrillation: Long-standing persistent.  She has been in AF, it appears, since 4/23. I suspect she would be unlikely to easily convert and remains in NSR at this point.  For now, will follow rate control strategy.  - Continue  Toprol XL 25 mg daily.    - Continue warfarin, goal INR 2.5-3.5.  2. Chronic diastolic CHF: PYP scan did not suggest ATTR amyloidosis.  Most recent echo in 8/23  showed EF 55-60%, mild LVH, normal RV, PASP 42 mmHg, mild MR, mild-moderate TR, mechanical St Jude aortic valve with mean gradient 26 mmHg (up from 21 mmHg). Elevation in gradient across the aortic valve may play a role. Has some lower extremity edema on exam, but this has been improving with compression stockings and discontinuation of prednisone. Otherwise, does not appears significantly overloaded. NYHA class III, probably limited more by frailty than anything else.  - Continue Lasix 80 mg qam/60 mg qpm.  - CMET from 05/10 reviewed 3. CAD: denies CP  - Off ASA. See #5 - Continue statin 4. PAD: Significant on 7/19 dopplers but no claudication, no pedal ulcerations. Followed by Dr. Allyson Sabal.  - Continue statin and off ASA as above 5. Mechanical aortic valve: Mean gradient elevated on recent echoes, 26 mmHg on 8/23 echo up from 21 mmHg on the prior.  This probably plays a role in CHF.  She is on warfarin with INR goal 2.5-3.5. Now off aspirin after recent GI bleed. Will review with Dr. Shirlee Latch. - I do not think that there is a role here for close investigation of aortic valve by TEE or CTA.  She has had an elevated gradient 20+ on last 2 echoes, do not think she would be a candidate for redo valve replacement and we cannot anticoagulate her more aggressively.  6. Pulmonary hypertension: Moderate pulmonary hypertension on 9/19 with RV dysfunction and dilation noted on last echo. PVR only 3.1 WU but cardiac output was high.  V/Q scan in 10/19 with no evidence for chronic PEs and PFTs in 10/19 and again in 7/20 were unremarkable except for low DLCO.  I think there  is a possible group 1 PH component though this is a borderline call as PVR is not significantly elevated. ANA and RF positive, as is CCP Ab.  She saw rheumatology and per her report, was not thought to have rheumatoid arthritis.  Subsequently, she has been diagnosed with p-ANCA+ vasculitis. CT chest showed subtle bilateral ground glass opacities in  2020, she is followed by pulmonary.  Recent repeat echo in 8/23 showed normal RV and estimated PA systolic pressure 42 mmHg mmHg.  - Continue Adcirca 40 mg daily.  It was borderline to start this, not sure she has had marked benefit.  Will hold off on starting a 2nd PH medication. 7. Rheumatology: Joint pain, positive RF and CCP Ab.  Lung findings (ground glass, pulmonary hypertension) also thought to be concerning for RA. However, per patient's report, she was by her rheumatologist that did not have RA and did have gout.  More recently, she has been found to have p-ANCA+ vasculitis, eosinophilic granulomatosis with polyangiitis.  She was treated with rituximab and prednisone by Dr Darnelle Catalan.  Now off prednisone. 8. HTN: BP controlled on current regimen.  9. CKD: Stage 4.  She would be a poor candidate for HD. She has P-ANCA vasculitis.  - Followed by Dr. Kathrene Bongo.  - Cr stable 2.37 on 05/10 10. Left Carotid stenosis: Has been seen by [facility].  -Not a candidate for TCAR 11. Multifactorial anemia: - Followed by hematology - Recent concern for GI bleed - Hgb 9.8 on 05/10  Followup in 4 months with APP  Signed, Maximino Sarin, PA-C  02/23/2023   [facility] [address] [facility] [place] [address] [phone] (office) [phone] (fax)

## 2023-02-26 ENCOUNTER — Encounter (HOSPITAL_COMMUNITY): Payer: Self-pay

## 2023-02-26 ENCOUNTER — Telehealth (HOSPITAL_COMMUNITY): Payer: Self-pay

## 2023-02-26 ENCOUNTER — Telehealth (HOSPITAL_COMMUNITY): Payer: Self-pay | Admitting: Physician Assistant

## 2023-02-26 DIAGNOSIS — L570 Actinic keratosis: Secondary | ICD-10-CM | POA: Diagnosis not present

## 2023-02-26 DIAGNOSIS — C44319 Basal cell carcinoma of skin of other parts of face: Secondary | ICD-10-CM | POA: Diagnosis not present

## 2023-02-26 DIAGNOSIS — X32XXXA Exposure to sunlight, initial encounter: Secondary | ICD-10-CM | POA: Diagnosis not present

## 2023-02-26 DIAGNOSIS — C44311 Basal cell carcinoma of skin of nose: Secondary | ICD-10-CM | POA: Diagnosis not present

## 2023-02-26 NOTE — Telephone Encounter (Signed)
LMOM and mychart message sent.

## 2023-02-26 NOTE — Telephone Encounter (Signed)
I reviewed patient's meds with Dr. Shirlee Latch. Aspirin recently stopped d/t anemia. With her mechanical aortic valve she should be on aspirin and coumadin. Recommend restarting 81 mg daily.   Call with any bleeding issues. Can check CBC in about 2-3 weeks.

## 2023-02-26 NOTE — Telephone Encounter (Signed)
-----   Message from Laurey Morale, MD sent at 02/23/2023  5:49 PM EDT ----- Normal EF.  Probably has mild mitral stenosis, no intervention for this. Gradient is mildly elevated across the aortic valve, similar to prior.

## 2023-02-26 NOTE — Telephone Encounter (Signed)
Patient aware of echo results.

## 2023-02-27 ENCOUNTER — Ambulatory Visit: Payer: Medicare HMO | Attending: Cardiology | Admitting: *Deleted

## 2023-02-27 DIAGNOSIS — Z5181 Encounter for therapeutic drug level monitoring: Secondary | ICD-10-CM

## 2023-02-27 DIAGNOSIS — Z952 Presence of prosthetic heart valve: Secondary | ICD-10-CM

## 2023-02-27 DIAGNOSIS — I272 Pulmonary hypertension, unspecified: Secondary | ICD-10-CM | POA: Diagnosis not present

## 2023-02-27 DIAGNOSIS — I2510 Atherosclerotic heart disease of native coronary artery without angina pectoris: Secondary | ICD-10-CM | POA: Diagnosis not present

## 2023-02-27 DIAGNOSIS — D631 Anemia in chronic kidney disease: Secondary | ICD-10-CM | POA: Diagnosis not present

## 2023-02-27 DIAGNOSIS — K529 Noninfective gastroenteritis and colitis, unspecified: Secondary | ICD-10-CM | POA: Diagnosis not present

## 2023-02-27 DIAGNOSIS — E1151 Type 2 diabetes mellitus with diabetic peripheral angiopathy without gangrene: Secondary | ICD-10-CM | POA: Diagnosis not present

## 2023-02-27 DIAGNOSIS — I110 Hypertensive heart disease with heart failure: Secondary | ICD-10-CM | POA: Diagnosis not present

## 2023-02-27 DIAGNOSIS — E1122 Type 2 diabetes mellitus with diabetic chronic kidney disease: Secondary | ICD-10-CM | POA: Diagnosis not present

## 2023-02-27 DIAGNOSIS — N184 Chronic kidney disease, stage 4 (severe): Secondary | ICD-10-CM | POA: Diagnosis not present

## 2023-02-27 DIAGNOSIS — I5032 Chronic diastolic (congestive) heart failure: Secondary | ICD-10-CM | POA: Diagnosis not present

## 2023-02-27 LAB — POCT INR: INR: 1.4 — AB (ref 2.0–3.0)

## 2023-02-27 NOTE — Telephone Encounter (Signed)
Patient's daughter confirmed she has continued with her Aspirin and did not stop it.

## 2023-02-27 NOTE — Patient Instructions (Signed)
Take warfarin 2 tablets tonight, take 1 1/2 tablets tomorrow night then continue 1 tablet daily.  Recheck INR in 2 weeks in [place].

## 2023-02-27 NOTE — Telephone Encounter (Signed)
Left message for her to call back

## 2023-03-02 ENCOUNTER — Inpatient Hospital Stay: Payer: Medicare HMO

## 2023-03-02 ENCOUNTER — Other Ambulatory Visit: Payer: Self-pay

## 2023-03-02 ENCOUNTER — Inpatient Hospital Stay (HOSPITAL_BASED_OUTPATIENT_CLINIC_OR_DEPARTMENT_OTHER): Payer: Medicare HMO | Admitting: Hematology and Oncology

## 2023-03-02 VITALS — BP 129/49 | HR 58 | Temp 98.1°F | Resp 18 | Ht <= 58 in | Wt 111.5 lb

## 2023-03-02 DIAGNOSIS — D631 Anemia in chronic kidney disease: Secondary | ICD-10-CM | POA: Diagnosis not present

## 2023-03-02 DIAGNOSIS — Z952 Presence of prosthetic heart valve: Secondary | ICD-10-CM | POA: Diagnosis not present

## 2023-03-02 DIAGNOSIS — R600 Localized edema: Secondary | ICD-10-CM

## 2023-03-02 DIAGNOSIS — I7782 Antineutrophilic cytoplasmic antibody (ANCA) vasculitis: Secondary | ICD-10-CM | POA: Diagnosis not present

## 2023-03-02 DIAGNOSIS — M8580 Other specified disorders of bone density and structure, unspecified site: Secondary | ICD-10-CM | POA: Diagnosis not present

## 2023-03-02 DIAGNOSIS — D591 Autoimmune hemolytic anemia, unspecified: Secondary | ICD-10-CM | POA: Diagnosis not present

## 2023-03-02 DIAGNOSIS — N184 Chronic kidney disease, stage 4 (severe): Secondary | ICD-10-CM | POA: Diagnosis not present

## 2023-03-02 DIAGNOSIS — D638 Anemia in other chronic diseases classified elsewhere: Secondary | ICD-10-CM

## 2023-03-02 LAB — COMPREHENSIVE METABOLIC PANEL
ALT: 15 U/L (ref 0–44)
AST: 27 U/L (ref 15–41)
Albumin: 3.9 g/dL (ref 3.5–5.0)
Alkaline Phosphatase: 101 U/L (ref 38–126)
Anion gap: 9 (ref 5–15)
BUN: 95 mg/dL — ABNORMAL HIGH (ref 8–23)
CO2: 25 mmol/L (ref 22–32)
Calcium: 9.2 mg/dL (ref 8.9–10.3)
Chloride: 99 mmol/L (ref 98–111)
Creatinine, Ser: 2.52 mg/dL — ABNORMAL HIGH (ref 0.44–1.00)
GFR, Estimated: 18 mL/min — ABNORMAL LOW (ref >60)
Glucose, Bld: 268 mg/dL — ABNORMAL HIGH (ref 70–99)
Potassium: 4.2 mmol/L (ref 3.5–5.1)
Sodium: 133 mmol/L — ABNORMAL LOW (ref 135–145)
Total Bilirubin: 0.5 mg/dL (ref 0.3–1.2)
Total Protein: 5.9 g/dL — ABNORMAL LOW (ref 6.5–8.1)

## 2023-03-02 LAB — CBC WITH DIFFERENTIAL/PLATELET
Abs Immature Granulocytes: 0.01 10*3/uL (ref 0.00–0.07)
Basophils Absolute: 0.1 10*3/uL (ref 0.0–0.1)
Basophils Relative: 1 %
Eosinophils Absolute: 0.5 10*3/uL (ref 0.0–0.5)
Eosinophils Relative: 10 %
HCT: 33 % — ABNORMAL LOW (ref 36.0–46.0)
Hemoglobin: 10.5 g/dL — ABNORMAL LOW (ref 12.0–15.0)
Immature Granulocytes: 0 %
Lymphocytes Relative: 30 %
Lymphs Abs: 1.4 10*3/uL (ref 0.7–4.0)
MCH: 29.2 pg (ref 26.0–34.0)
MCHC: 31.8 g/dL (ref 30.0–36.0)
MCV: 91.9 fL (ref 80.0–100.0)
Monocytes Absolute: 0.4 10*3/uL (ref 0.1–1.0)
Monocytes Relative: 9 %
Neutro Abs: 2.3 10*3/uL (ref 1.7–7.7)
Neutrophils Relative %: 50 %
Platelets: 100 10*3/uL — ABNORMAL LOW (ref 150–400)
RBC: 3.59 MIL/uL — ABNORMAL LOW (ref 3.87–5.11)
RDW: 16.4 % — ABNORMAL HIGH (ref 11.5–15.5)
WBC: 4.6 10*3/uL (ref 4.0–10.5)
nRBC: 0 % (ref 0.0–0.2)

## 2023-03-02 LAB — SAMPLE TO BLOOD BANK

## 2023-03-02 NOTE — Progress Notes (Signed)
[facility] Cancer Follow up:    [name], [name], MD [address] [place] [address]   DIAGNOSIS: Anemia  SUMMARY OF HEMATOLOGIC HISTORY: [name] is a 82 y.o. [place] woman with multifactorial anemia, as follows:             (a) anemia of chronic inflammation: As of May 2020 she has a SED rate of 95, CRP 1.1, positive ANA and RF; subsequently she was found to be ANCA positive             (b) hemolysis (mild): she has a mildly elevated LDH and a DAT positive for complement, not IgG; this is c/w (a) above             (c) anemia of renal insufficiency: inadequate EPO resonse to anemia                         (d) GIB/ iron deficiency in patient on lifelong anticoagulaton   (1) Feraheme received 03/01/2019, repeated 03/20/2019              (a) reticulocyte 03/07/2019 up from 43.4 a year ago to 191.8 after iron infusion             (b) feraheme repeated on 05/12/2019             (c) subsequent ferritin levels >100   (2) darbepoetin: starting 05/05/2019 at given every 2 weeks             (a) dose increased to 200 mcg every week starting 07/01/2019              (b) back to every 2 weeks beginning 08/04/2019 still at 200 mcg dose             (c) switched to Retacrit every 4 weeks as of 08/18/2019   (3) ANCA positive vasculitis: diagnosed 05/08/2019 (titer 1:640)             (a) prednisone 40 mg daily started 05/31/2019             (b) rituximab weekly started 06/12/2019 (received test dose 06/05/2019)                         (i) hepatitis B sAg, core Ab and HIV negative 05/08/2019                         (ii) rituximab held after 07/14/2019 dose (received 5 weekly doses) with neutropenia                         (iii) rituximab resumed 08/18/2019 but changed to monthly                         (iv) rituximab changed to every 2 months beginning with 02/02/2020 dose                         (v) rituximab changed to every 12 weeks after the 10/12/2020 dose              (c) prednisone tapered to off as of 09/15/2019                          (4) Anemia worsened in spring 2023, refractory  to Rituximab and requiring prednisone restart with slow taper due currently at 20mg  which she will stay at.   (5) osteopenia with a T score of -1.5 on bone density 02/28/2016 at the [facility] center  CURRENT THERAPY:Retacrit  INTERVAL HISTORY:  [name] GETTING 82 y.o. female returns for f/u and evaluation of her anemia.  She is accompanied by her son today. Tossie tells me that she is feeling depressed today, she can't live by herself and that's making her sad. She is otherwise compliant with all her medication. She says LE swelling is better, cough is bothersome, follow up scheduled with pulmonary team soon. No bleeding issues reported. Diarrhea is better.   Patient Active Problem List   Diagnosis Date Noted   Protein-calorie malnutrition, severe 12/26/2022   Acute GI bleeding 12/24/2022   Coagulopathy (HCC) 12/24/2022   Melena 12/24/2022   Duodenitis 12/24/2022   Noninfectious gastroenteritis 12/24/2022   CAD, multiple vessel 12/24/2022   Acute on chronic heart failure with preserved ejection fraction (HFpEF) (HCC) 08/29/2022   Acute exacerbation of CHF (congestive heart failure) (HCC) 08/29/2022   Acute urinary retention 08/28/2022   Acute kidney injury superimposed on chronic kidney disease (HCC) 08/27/2022   Constipation 08/27/2022   Nausea and vomiting 08/27/2022   Hypoalbuminemia due to protein-calorie malnutrition (HCC) 08/27/2022   Type 2 diabetes mellitus with hyperglycemia (HCC) 08/27/2022   Palliative care encounter    Goals of care, counseling/discussion    Encounter for therapeutic drug monitoring    GI bleed 07/09/2022   GI bleeding 07/08/2022   Acute on chronic anemia 07/08/2022   History of Clostridioides difficile colitis 07/08/2022   DNR (do not resuscitate)/DNI(Do Not Intubate) 07/08/2022   Anemia of chronic disease 07/07/2022    Pressure injury of skin 06/23/2022   UTI (urinary tract infection) 06/22/2022   Joint pain 06/09/2022   Parietoalveolar pneumopathy (HCC) 06/09/2022   Primary osteoarthritis 06/09/2022   Rheumatoid factor positive 06/09/2022   C. difficile colitis 04/19/2022   Colitis 04/18/2022   PAH (pulmonary artery hypertension) (HCC) 04/18/2022   Emphysematous cystitis 04/13/2022   Female bladder prolapse 04/13/2022   Diverticulitis 04/12/2022   Autoimmune hemolytic anemia (HCC) 01/02/2022   Bilateral lower extremity edema 01/02/2022   Refractory anemia (HCC) 12/15/2021   Abnormal chest x-ray 12/15/2021   Supratherapeutic INR 12/15/2021   Elevated troponin 12/15/2021   Unilateral primary osteoarthritis, right hip 05/31/2021   Midline cystocele 05/30/2021   Primary localized osteoarthritis of pelvic region and thigh 05/30/2021   Vaginal pain 05/30/2021   Asymmetric SNHL (sensorineural hearing loss) 01/06/2021   Referred otalgia of both ears 01/06/2021   Bilateral hearing loss 08/31/2020   Low back pain 02/20/2020   CKD (chronic kidney disease), stage IV (HCC) 10/13/2019   Leukopenia due to antineoplastic chemotherapy (HCC) 08/18/2019   Thrombophilia (HCC) 06/19/2019   Encounter for general adult medical examination without abnormal findings 06/13/2019   Arteritis (HCC) 06/06/2019   Hypertensive heart and chronic kidney disease with heart failure and stage 1 through stage 4 chronic kidney disease, or unspecified chronic kidney disease (HCC) 06/06/2019   Muscle weakness 06/06/2019   Pancytopenia (HCC) 05/28/2019   Dyspnea 05/28/2019   Vasculitis, ANCA positive 05/28/2019   Disorder of connective tissue (HCC) 05/14/2019   Renal insufficiency 05/08/2019   PAF (paroxysmal atrial fibrillation) (HCC) 04/07/2019   Iron deficiency anemia 02/28/2019   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 (HCC) 02/28/2019   Diabetic retinopathy associated with type 2 diabetes mellitus (HCC)  09/02/2018  Chronic pain 06/19/2018   Non-thrombocytopenic purpura (HCC) 05/21/2018   Gout 03/22/2018   Malnutrition of moderate degree 03/12/2018   Diabetes mellitus type 2, controlled (HCC) 03/06/2018   Chronic atrial fibrillation (HCC) 03/06/2018   H/O mechanical aortic valve replacement 03/06/2018   Moderate to severe pulmonary hypertension (HCC) 03/06/2018   GERD without esophagitis 02/21/2018   Anxiety 02/21/2018   Chronic diastolic CHF (congestive heart failure) (HCC) 02/21/2018   General weakness 02/02/2018   Thrombocytopenia (HCC) 01/25/2018   Arm pain, diffuse 01/25/2018   Anemia 01/23/2018   Aortic atherosclerosis (HCC) 01/23/2018   Age-related osteoporosis without current pathological fracture 05/24/2017   Carotid artery occlusion 05/24/2017   Generalized anxiety disorder 05/24/2017   Hearing loss 05/24/2017   Presence of prosthetic heart valve 05/24/2017   Carotid artery disease (HCC) 08/18/2013   Peripheral arterial disease (HCC) 08/18/2013   Diabetes mellitus without complication (HCC) 08/18/2013   Essential hypertension    Hypercholesteremia    Mechanical heart valve present    Chronic anticoagulation     is allergic to augmentin [amoxicillin-pot clavulanate], ciprofloxacin, codeine, coreg [carvedilol], diflucan [fluconazole], levofloxacin in d5w, losartan, privigen [immune globulin (human)], sulfamethoxazole-trimethoprim, verapamil, zetia [ezetimibe], zocor [simvastatin - high dose], and gatifloxacin.  MEDICAL HISTORY: Past Medical History:  Diagnosis Date   Aortic atherosclerosis (HCC) 01/23/2018   Atrial fibrillation (HCC)    Benign positional vertigo 10/06/2011   Carotid artery disease (HCC)    Chemotherapy induced neutropenia (HCC) 07/21/2019   Cholelithiasis 01/23/2018   Cholelithiasis 01/23/2018   Chronic anticoagulation    Colovesical fistula, ruled out 04/14/2022   Coronary artery disease    status post coronary artery bypass grafting times  07/10/2004   Diabetes mellitus    Hypercholesteremia    Hypertension    Mechanical heart valve present    H. aortic valve replacement at the time of bypass surgery October 2005   Moderate to severe pulmonary hypertension (HCC)    Peripheral arterial disease (HCC)    history of left common iliac artery PTA and stenting for a chronic total occlusion 08/26/01   S/P cholecystectomy 03/06/2018    SURGICAL HISTORY: Past Surgical History:  Procedure Laterality Date   anterior repair  2009   AORTIC VALVE REPLACEMENT  2005   Dr. Laneta Simmers   CARDIAC CATHETERIZATION  11/10/2004   40% right common illiac, 70% in stent restenosis of distal left common illiac,    CARDIAC CATHETERIZATION  05/18/2004   LAD 50-70% midstenosis, RCA dominant w/50% stenosis, 50% Right common Illiac artery ostial stenosis, 90% in stent restenosis within midportion of left common illiac stent   Carotid Duplex  03/12/2012   RSA-elev. velocities suggestive of a 50-69% diameter reduction, Right&Left Bulb/Prox ICA-mild-mod.fibrous plaqueelevating Velocities abnormal study.   CHOLECYSTECTOMY N/A 03/01/2018   Procedure: LAPAROSCOPIC CHOLECYSTECTOMY;  Surgeon: Jimmye Norman, MD;  Location: [facility] OR;  Service: General;  Laterality: N/A;   COLONOSCOPY WITH PROPOFOL N/A 01/22/2018   Procedure: COLONOSCOPY WITH PROPOFOL;  Surgeon: Charlott Rakes, MD;  Location: [facility] ENDOSCOPY;  Service: Endoscopy;  Laterality: N/A;   ESOPHAGOGASTRODUODENOSCOPY N/A 07/13/2022   Procedure: ESOPHAGOGASTRODUODENOSCOPY (EGD);  Surgeon: Kerin Salen, MD;  Location: [facility] ENDOSCOPY;  Service: Gastroenterology;  Laterality: N/A;   ESOPHAGOGASTRODUODENOSCOPY (EGD) WITH PROPOFOL N/A 01/22/2018   Procedure: ESOPHAGOGASTRODUODENOSCOPY (EGD) WITH PROPOFOL;  Surgeon: Charlott Rakes, MD;  Location: [facility] ENDOSCOPY;  Service: Endoscopy;  Laterality: N/A;   ESOPHAGOGASTRODUODENOSCOPY (EGD) WITH PROPOFOL N/A 11/21/2018   Procedure: ESOPHAGOGASTRODUODENOSCOPY (EGD) WITH PROPOFOL;   Surgeon: Kerin Salen, MD;  Location: [facility] ENDOSCOPY;  Service: Gastroenterology;  Laterality: N/A;   ESOPHAGOGASTRODUODENOSCOPY (EGD) WITH PROPOFOL Left 02/27/2019   Procedure: ESOPHAGOGASTRODUODENOSCOPY (EGD) WITH PROPOFOL;  Surgeon: Willis Modena, MD;  Location: [facility] ENDOSCOPY;  Service: Endoscopy;  Laterality: Left;   GIVENS CAPSULE STUDY N/A 11/21/2018   Procedure: GIVENS CAPSULE STUDY;  Surgeon: Kerin Salen, MD;  Location: [facility] ENDOSCOPY;  Service: Gastroenterology;  Laterality: N/A;  To be deployed during EGD   Lower Ext. Duplex  03/12/2012   Right Proximal CIA- vessel narrowing w/elevated velocities 0-49% diameter reduction. Right SFA-mild mixed density plaque throughout vessel.   NM MYOCAR PERF WALL MOTION  05/19/2010   protocol: Persantine, post stress EF 65%, negative for ischemia, low risk scan   RIGHT HEART CATH N/A 06/27/2018   Procedure: RIGHT HEART CATH;  Surgeon: Laurey Morale, MD;  Location: [facility] INVASIVE CV LAB;  Service: Cardiovascular;  Laterality: N/A;   TOTAL ABDOMINAL HYSTERECTOMY W/ BILATERAL SALPINGOOPHORECTOMY  1989   TRANSTHORACIC ECHOCARDIOGRAM  08/29/2012   Moderately calcified annulus of mitral valve, moderate regurg. of both mitral valve and tricuspid valve.     SOCIAL HISTORY: Social History   Socioeconomic History   Marital status: Widowed    Spouse name: Not on file   Number of children: Not on file   Years of education: Not on file   Highest education level: Not on file  Occupational History   Occupation: Retired  Tobacco Use   Smoking status: Former    Packs/day: 1.00    Years: 30.00    Additional pack years: 0.00    Total pack years: 30.00    Types: Cigarettes   Smokeless tobacco: Never   Tobacco comments:    quit smoking 2005  Vaping Use   Vaping Use: Never used  Substance and Sexual Activity   Alcohol use: No    Alcohol/week: 0.0 standard drinks of alcohol   Drug use: No   Sexual activity: Not Currently    Birth control/protection: None   Other Topics Concern   Not on file  Social History Narrative   Not on file   Social Determinants of Health   Financial Resource Strain: Low Risk  (02/28/2022)   Overall Financial Resource Strain (CARDIA)    Difficulty of Paying Living Expenses: Not hard at all  Food Insecurity: No Food Insecurity (12/25/2022)   Hunger Vital Sign    Worried About Running Out of Food in the Last Year: Never true    Ran Out of Food in the Last Year: Never true  Transportation Needs: No Transportation Needs (12/25/2022)   PRAPARE - Administrator, Civil Service (Medical): No    Lack of Transportation (Non-Medical): No  Physical Activity: Inactive (02/28/2022)   Exercise Vital Sign    Days of Exercise per Week: 0 days    Minutes of Exercise per Session: 0 min  Stress: No Stress Concern Present (02/28/2022)   [employer] of Occupational Health - Occupational Stress Questionnaire    Feeling of Stress : Only a little  Social Connections: Moderately Integrated (06/22/2022)   Social Connection and Isolation Panel [NHANES]    Frequency of Communication with Friends and Family: More than three times a week    Frequency of Social Gatherings with Friends and Family: More than three times a week    Attends Religious Services: More than 4 times per year    Active Member of [employer] or Organizations: Yes    Attends Banker Meetings: More than 4 times per year  Marital Status: Widowed  Intimate Partner Violence: Not At Risk (12/25/2022)   Humiliation, Afraid, Rape, and Kick questionnaire    Fear of Current or Ex-Partner: No    Emotionally Abused: No    Physically Abused: No    Sexually Abused: No    FAMILY HISTORY: Family History  Problem Relation Age of Onset   Heart disease Father    Hypertension Father    Hyperlipidemia Father    Breast cancer Neg Hx     Review of Systems  Constitutional:  Positive for fatigue. Negative for appetite change, chills, fever and unexpected  weight change.  HENT:   Negative for hearing loss, lump/mass, mouth sores and trouble swallowing.   Eyes:  Negative for eye problems and icterus.  Respiratory:  Negative for chest tightness, cough and shortness of breath.   Cardiovascular:  Positive for leg swelling. Negative for chest pain and palpitations.  Gastrointestinal:  Negative for abdominal distention, abdominal pain, constipation, diarrhea, nausea and vomiting.  Endocrine: Negative for hot flashes.  Genitourinary:  Negative for difficulty urinating.   Musculoskeletal:  Negative for arthralgias.  Skin:  Negative for itching and rash.  Neurological:  Negative for dizziness, extremity weakness, headaches and numbness.  Hematological:  Negative for adenopathy. Does not bruise/bleed easily.  Psychiatric/Behavioral:  Negative for depression. The patient is not nervous/anxious.       PHYSICAL EXAMINATION    Vitals:   03/02/23 1249  BP: (!) 129/49  Pulse: (!) 58  Resp: 18  Temp: 98.1 F (36.7 C)  SpO2: 100%    Physical Exam Constitutional:      General: She is not in acute distress.    Appearance: Normal appearance. She is ill-appearing (appears chronically ill). She is not toxic-appearing.  HENT:     Head: Normocephalic and atraumatic.  Eyes:     General: No scleral icterus. Cardiovascular:     Rate and Rhythm: Normal rate and regular rhythm.     Pulses: Normal pulses.     Heart sounds: Normal heart sounds.  Pulmonary:     Effort: Pulmonary effort is normal.     Breath sounds: Normal breath sounds.  Abdominal:     General: Abdomen is flat. Bowel sounds are normal. There is no distension.     Palpations: Abdomen is soft.     Tenderness: There is no abdominal tenderness.     Comments: Limited exam while in wheelchair   Musculoskeletal:        General: Swelling (mild bilateral lower extremity pedal, significantly better) present.     Cervical back: Neck supple.  Lymphadenopathy:     Cervical: No cervical  adenopathy.  Skin:    General: Skin is warm and dry.     Findings: No rash.  Neurological:     General: No focal deficit present.     Mental Status: She is alert.  Psychiatric:        Mood and Affect: Mood normal.        Behavior: Behavior normal.     LABORATORY DATA:  CBC    Component Value Date/Time   WBC 4.6 03/02/2023 1224   RBC 3.59 (L) 03/02/2023 1224   HGB 10.5 (L) 03/02/2023 1224   HGB 11.9 (L) 11/10/2022 1225   HCT 33.0 (L) 03/02/2023 1224   HCT 28.5 (L) 11/20/2018 1824   PLT 100 (L) 03/02/2023 1224   PLT 153 11/10/2022 1225   MCV 91.9 03/02/2023 1224   MCH 29.2 03/02/2023 1224   MCHC 31.8 03/02/2023  1224   RDW 16.4 (H) 03/02/2023 1224   LYMPHSABS 1.4 03/02/2023 1224   MONOABS 0.4 03/02/2023 1224   EOSABS 0.5 03/02/2023 1224   BASOSABS 0.1 03/02/2023 1224    CMP     Component Value Date/Time   NA 133 (L) 02/16/2023 1325   NA 137 12/06/2017 1436   K 4.2 02/16/2023 1325   CL 100 02/16/2023 1325   CO2 25 02/16/2023 1325   GLUCOSE 197 (H) 02/16/2023 1325   BUN 93 (H) 02/16/2023 1325   BUN 20 12/06/2017 1436   CREATININE 2.37 (H) 02/16/2023 1325   CREATININE 2.32 (H) 12/08/2022 1208   CALCIUM 9.1 02/16/2023 1325   PROT 5.9 (L) 02/16/2023 1325   ALBUMIN 3.8 02/16/2023 1325   AST 35 02/16/2023 1325   AST 60 (H) 12/08/2022 1208   ALT 17 02/16/2023 1325   ALT 42 12/08/2022 1208   ALKPHOS 106 02/16/2023 1325   BILITOT 0.4 02/16/2023 1325   BILITOT 0.7 12/08/2022 1208   GFRNONAA 20 (L) 02/16/2023 1325   GFRNONAA 20 (L) 12/08/2022 1208   GFRAA 32 (L) 03/30/2020 0932    ASSESSMENT and THERAPY PLAN:   YARETHZY ELLINGER is a 82 y.o. female who presents for multifactorial anemia, previously treated with rituximab for ANCA positive hemolytic anemia, previously on maintenance rituximab every 12 weeks presented with worsening anemia thought to be related to her hemolysis.  She was diagnosed with elevated ANCA titers 1 is to 640 and previously treated for ANCA  positive hemolytic anemia, seen by rheumatology Dr. Carleene Cooper back in 2020, responded at that time to prednisone and rituximab and continued on rituximab maintenance.  However her rituximab was changed to every 74-month infusions and she relapsed before her second cycle of every 64-month rituximab.    # Multifactorial Anemia:  --Bone marrow biopsy from 02/20/2022 showed normocellular marrow with trilineage hematopoiesis. No leukemia, lymphoma or high-grade dysplasia identified. --CT CAP from 02/20/2022 did not show any underlying cause for anemia.  --ANCA titers are normal when checked on 02/10/2022 -- Currently on EPO every 2 weeks. Ok to proceed with it, Hb under 10. No indication for transfusion -- Today's hemoglobin is 10.5, hold epo. --Labs every 2 weeks, FU every 4 weeks.   #Bilateral lower extremity edema significantly improved   #Warfarin therapy for St. Jude's valve --Under the care of  [facility].    # Situational depression, discussed about it.   # Dry cough, we discussed that she should follow up with pulmonology as scheduled.  All questions were answered. The patient knows to call the clinic with any problems, questions or concerns. We can certainly see the patient much sooner if necessary.  Total encounter time:20 minutes*in face-to-face visit time, chart review, lab review, care coordination, order entry, and documentation of the encounter time.    *Total Encounter Time as defined by the Centers for Medicare and Medicaid Services includes, in addition to the face-to-face time of a patient visit (documented in the note above) non-face-to-face time: obtaining and reviewing outside history, ordering and reviewing medications, tests or procedures, care coordination (communications with other health care professionals or caregivers) and documentation in the medical record.

## 2023-03-08 DIAGNOSIS — D631 Anemia in chronic kidney disease: Secondary | ICD-10-CM | POA: Diagnosis not present

## 2023-03-08 DIAGNOSIS — I5032 Chronic diastolic (congestive) heart failure: Secondary | ICD-10-CM | POA: Diagnosis not present

## 2023-03-08 DIAGNOSIS — N184 Chronic kidney disease, stage 4 (severe): Secondary | ICD-10-CM | POA: Diagnosis not present

## 2023-03-08 DIAGNOSIS — E1151 Type 2 diabetes mellitus with diabetic peripheral angiopathy without gangrene: Secondary | ICD-10-CM | POA: Diagnosis not present

## 2023-03-08 DIAGNOSIS — K529 Noninfective gastroenteritis and colitis, unspecified: Secondary | ICD-10-CM | POA: Diagnosis not present

## 2023-03-08 DIAGNOSIS — E1122 Type 2 diabetes mellitus with diabetic chronic kidney disease: Secondary | ICD-10-CM | POA: Diagnosis not present

## 2023-03-08 DIAGNOSIS — I272 Pulmonary hypertension, unspecified: Secondary | ICD-10-CM | POA: Diagnosis not present

## 2023-03-08 DIAGNOSIS — I110 Hypertensive heart disease with heart failure: Secondary | ICD-10-CM | POA: Diagnosis not present

## 2023-03-08 DIAGNOSIS — I2510 Atherosclerotic heart disease of native coronary artery without angina pectoris: Secondary | ICD-10-CM | POA: Diagnosis not present

## 2023-03-13 ENCOUNTER — Other Ambulatory Visit (HOSPITAL_BASED_OUTPATIENT_CLINIC_OR_DEPARTMENT_OTHER): Payer: Self-pay | Admitting: Internal Medicine

## 2023-03-13 ENCOUNTER — Encounter (HOSPITAL_BASED_OUTPATIENT_CLINIC_OR_DEPARTMENT_OTHER): Payer: Self-pay

## 2023-03-13 ENCOUNTER — Encounter (INDEPENDENT_AMBULATORY_CARE_PROVIDER_SITE_OTHER): Payer: Medicare HMO

## 2023-03-13 DIAGNOSIS — E1122 Type 2 diabetes mellitus with diabetic chronic kidney disease: Secondary | ICD-10-CM | POA: Diagnosis not present

## 2023-03-13 DIAGNOSIS — R609 Edema, unspecified: Secondary | ICD-10-CM

## 2023-03-14 ENCOUNTER — Ambulatory Visit: Payer: Medicare HMO | Attending: Cardiology | Admitting: *Deleted

## 2023-03-14 DIAGNOSIS — Z952 Presence of prosthetic heart valve: Secondary | ICD-10-CM

## 2023-03-14 DIAGNOSIS — Z5181 Encounter for therapeutic drug level monitoring: Secondary | ICD-10-CM | POA: Diagnosis not present

## 2023-03-14 LAB — POCT INR: INR: 1.7 — AB (ref 2.0–3.0)

## 2023-03-14 NOTE — Patient Instructions (Signed)
Take warfarin 2 tablets tonight then increase dose to 1 tablet daily except 1 1/2 tablets on Tuesdays and Fridays  Recheck INR in 2 weeks in [place].

## 2023-03-16 ENCOUNTER — Inpatient Hospital Stay: Payer: Medicare HMO | Attending: Oncology

## 2023-03-16 ENCOUNTER — Inpatient Hospital Stay: Payer: Medicare HMO

## 2023-03-16 ENCOUNTER — Other Ambulatory Visit: Payer: Self-pay

## 2023-03-16 DIAGNOSIS — M8580 Other specified disorders of bone density and structure, unspecified site: Secondary | ICD-10-CM | POA: Diagnosis not present

## 2023-03-16 DIAGNOSIS — D591 Autoimmune hemolytic anemia, unspecified: Secondary | ICD-10-CM | POA: Diagnosis not present

## 2023-03-16 DIAGNOSIS — I7782 Antineutrophilic cytoplasmic antibody (ANCA) vasculitis: Secondary | ICD-10-CM | POA: Diagnosis not present

## 2023-03-16 DIAGNOSIS — R7402 Elevation of levels of lactic acid dehydrogenase (LDH): Secondary | ICD-10-CM | POA: Insufficient documentation

## 2023-03-16 DIAGNOSIS — N184 Chronic kidney disease, stage 4 (severe): Secondary | ICD-10-CM | POA: Insufficient documentation

## 2023-03-16 DIAGNOSIS — D631 Anemia in chronic kidney disease: Secondary | ICD-10-CM | POA: Insufficient documentation

## 2023-03-16 LAB — CBC WITH DIFFERENTIAL/PLATELET
Abs Immature Granulocytes: 0.02 10*3/uL (ref 0.00–0.07)
Basophils Absolute: 0 10*3/uL (ref 0.0–0.1)
Basophils Relative: 1 %
Eosinophils Absolute: 0.4 10*3/uL (ref 0.0–0.5)
Eosinophils Relative: 5 %
HCT: 31.1 % — ABNORMAL LOW (ref 36.0–46.0)
Hemoglobin: 10.4 g/dL — ABNORMAL LOW (ref 12.0–15.0)
Immature Granulocytes: 0 %
Lymphocytes Relative: 19 %
Lymphs Abs: 1.4 10*3/uL (ref 0.7–4.0)
MCH: 29.8 pg (ref 26.0–34.0)
MCHC: 33.4 g/dL (ref 30.0–36.0)
MCV: 89.1 fL (ref 80.0–100.0)
Monocytes Absolute: 0.5 10*3/uL (ref 0.1–1.0)
Monocytes Relative: 8 %
Neutro Abs: 4.8 10*3/uL (ref 1.7–7.7)
Neutrophils Relative %: 67 %
Platelets: 109 10*3/uL — ABNORMAL LOW (ref 150–400)
RBC: 3.49 MIL/uL — ABNORMAL LOW (ref 3.87–5.11)
RDW: 16.1 % — ABNORMAL HIGH (ref 11.5–15.5)
WBC: 7.1 10*3/uL (ref 4.0–10.5)
nRBC: 0 % (ref 0.0–0.2)

## 2023-03-16 LAB — COMPREHENSIVE METABOLIC PANEL
ALT: 15 U/L (ref 0–44)
AST: 28 U/L (ref 15–41)
Albumin: 3.8 g/dL (ref 3.5–5.0)
Alkaline Phosphatase: 118 U/L (ref 38–126)
Anion gap: 10 (ref 5–15)
BUN: 98 mg/dL — ABNORMAL HIGH (ref 8–23)
CO2: 23 mmol/L (ref 22–32)
Calcium: 9.1 mg/dL (ref 8.9–10.3)
Chloride: 99 mmol/L (ref 98–111)
Creatinine, Ser: 2.94 mg/dL — ABNORMAL HIGH (ref 0.44–1.00)
GFR, Estimated: 15 mL/min — ABNORMAL LOW (ref >60)
Glucose, Bld: 241 mg/dL — ABNORMAL HIGH (ref 70–99)
Potassium: 4.6 mmol/L (ref 3.5–5.1)
Sodium: 132 mmol/L — ABNORMAL LOW (ref 135–145)
Total Bilirubin: 0.5 mg/dL (ref 0.3–1.2)
Total Protein: 6.1 g/dL — ABNORMAL LOW (ref 6.5–8.1)

## 2023-03-16 NOTE — Progress Notes (Signed)
Pt here for injection. Hgb is 10.4.  Per Dr. Remonia Richter note on 03/02/23, hold injection if hgb is above 10. Pt verbalized understanding and labs were printed.

## 2023-03-20 ENCOUNTER — Encounter: Payer: Self-pay | Admitting: Podiatry

## 2023-03-20 ENCOUNTER — Ambulatory Visit: Payer: Medicare HMO | Admitting: Podiatry

## 2023-03-20 VITALS — BP 144/63 | HR 54 | Temp 96.8°F

## 2023-03-20 DIAGNOSIS — B351 Tinea unguium: Secondary | ICD-10-CM | POA: Diagnosis not present

## 2023-03-20 DIAGNOSIS — E1151 Type 2 diabetes mellitus with diabetic peripheral angiopathy without gangrene: Secondary | ICD-10-CM

## 2023-03-20 DIAGNOSIS — M79674 Pain in right toe(s): Secondary | ICD-10-CM | POA: Diagnosis not present

## 2023-03-20 DIAGNOSIS — M79675 Pain in left toe(s): Secondary | ICD-10-CM

## 2023-03-21 DIAGNOSIS — R2241 Localized swelling, mass and lump, right lower limb: Secondary | ICD-10-CM | POA: Diagnosis not present

## 2023-03-21 DIAGNOSIS — M79604 Pain in right leg: Secondary | ICD-10-CM | POA: Diagnosis not present

## 2023-03-21 DIAGNOSIS — L03115 Cellulitis of right lower limb: Secondary | ICD-10-CM | POA: Diagnosis not present

## 2023-03-21 NOTE — Progress Notes (Signed)
Subjective:  Patient ID: [name], female    DOB: Mar 06, 1941,  MRN: [id number]  [name] presents to clinic today for at risk foot care. Pt has h/o NIDDM with PAD and painful thick toenails that are difficult to trim. Pain interferes with ambulation. Aggravating factors include wearing enclosed shoe gear. Pain is relieved with periodic professional debridement. Chief Complaint  Patient presents with   Routine foot care   New problem(s):   Patient relates right leg is tender and has noticed discoloration to area.  PCP is [name], [name], MD.  Allergies  Allergen Reactions   Augmentin [Amoxicillin-Pot Clavulanate] Diarrhea and Nausea And Vomiting   Ciprofloxacin Diarrhea, Nausea Only and Other (See Comments)    Stomach ache   Codeine     Insomnia, anxiety   Coreg [Carvedilol] Other (See Comments)    Terrible cramping in the feet and had a lot of bowel movements, but not diarrhea   Diflucan [Fluconazole] Diarrhea   Levofloxacin In D5w     Vomiting, diarrhea, insomnia   Losartan Swelling and Other (See Comments)    Patient doesn't recall site of swelling   Privigen [Immune Globulin (Human)] Other (See Comments)    Severe/excruciating pain   Sulfamethoxazole-Trimethoprim Diarrhea, Nausea Only and Other (See Comments)    Stomach upset   Verapamil Hives   Zetia [Ezetimibe] Other (See Comments)    Reaction not recalled   Zocor [Simvastatin - High Dose] Other (See Comments)    Reaction not recalled   Gatifloxacin Rash and Other (See Comments)    Redness to skin around eye   Review of Systems: Negative except as noted in the HPI. Objective:   Vitals:   03/20/23 1055  BP: (!) 144/63  Pulse: (!) 54  Temp: (!) 96.8 F (36 C)  SpO2: 97%   Constitutional [name] is a pleasant 82 y.o. female, thin build in NAD. AAO x 3.   Vascular Vascular Examination: Capillary refill time immediate b/l. Vascular status with faintly palpable pedal pulses. Pedal hair present  absent. No pain with calf compression b/l. Skin temperature gradient WNL LLE. She has warmth noted on anterior shin mid 1/3 leg with erythema. There is tenderness to palpation. No cyanosis or clubbing b/l.   Neurological Examination: Sensation grossly intact b/l with 10 gram monofilament. Vibratory sensation intact b/l.   Dermatological Examination: Mid 1/3 anterior leg with slight warmth, erythema and tenderness to palpation.   Pedal skin with normal turgor, texture and tone b/l.  No open wounds. No interdigital macerations.   Toenails 1-5 b/l thick, discolored, elongated with subungual debris and pain on dorsal palpation.   No hyperkeratotic nor porokeratotic lesions present on today's visit.  Musculoskeletal Examination: Muscle strength 5/5 to all lower extremity muscle groups bilaterally. No pain, crepitus or joint limitation noted with ROM bilateral LE. Tailor's bunion deformity noted b/l LE.  Radiographs: None  Last A1c:      Latest Ref Rng & Units 12/24/2022    3:25 PM 04/27/2022    5:46 AM  Hemoglobin A1C  Hemoglobin-A1c 4.8 - 5.6 % 5.6  6.3      Assessment:   1. Pain due to onychomycosis of toenails of both feet   2. Type II diabetes mellitus with peripheral circulatory disorder [facility])    Plan:  Patient was evaluated and treated and all questions answered. Consent given for treatment as described below: -Patient was evaluated and treated. All patient's and/or POA's questions/concerns answered on today's visit. -Suspect cellulitis  RLE. Patient with remote h/o hospitalization with colitis. Discussed with patient/son. Advised son to send pics to PCP via MyChart or go to urgent care. They related understanding. -Patient to continue soft, supportive shoe gear daily. -Toenails 1-5 b/l were debrided in length and girth with sterile nail nippers and dremel without iatrogenic bleeding.  -Patient/POA to call should there be question/concern in the interim.  Return in about 3  months (around 06/20/2023).  Freddie Breech, DPM

## 2023-03-22 ENCOUNTER — Ambulatory Visit: Payer: Medicare HMO | Admitting: Podiatry

## 2023-03-26 ENCOUNTER — Ambulatory Visit: Payer: Medicare HMO | Admitting: Internal Medicine

## 2023-03-26 ENCOUNTER — Ambulatory Visit (HOSPITAL_COMMUNITY)
Admission: RE | Admit: 2023-03-26 | Discharge: 2023-03-26 | Disposition: A | Discharge status: Home / Self Care | Payer: Medicare HMO | Source: Ambulatory Visit | Attending: Internal Medicine | Admitting: Internal Medicine

## 2023-03-26 ENCOUNTER — Encounter: Payer: Self-pay | Admitting: Internal Medicine

## 2023-03-26 VITALS — BP 132/56 | HR 55 | Ht 59.0 in | Wt 111.2 lb

## 2023-03-26 DIAGNOSIS — R053 Chronic cough: Secondary | ICD-10-CM | POA: Diagnosis not present

## 2023-03-26 DIAGNOSIS — R059 Cough, unspecified: Secondary | ICD-10-CM | POA: Diagnosis not present

## 2023-03-26 DIAGNOSIS — J449 Chronic obstructive pulmonary disease, unspecified: Secondary | ICD-10-CM | POA: Diagnosis not present

## 2023-03-26 DIAGNOSIS — R0602 Shortness of breath: Secondary | ICD-10-CM | POA: Diagnosis not present

## 2023-03-26 NOTE — Patient Instructions (Addendum)
Pantoprazole 40 mg Take 30- 60 min before your first and last meals of the day and pepcid 20 mg x one pill x one hour before bedtime and reduce the pulmicort to once a day   If night time cough not improving :  For drainage / throat tickle try take CHLORPHENIRAMINE  4 mg  ("Allergy Relief" 4mg   at [facility] should be easiest to find in the blue box usually on bottom shelf)  take one every 4 hours as needed - extremely effective and inexpensive over the counter- may cause drowsiness so start with just a dose or two an hour before bedtime and see how you tolerate it before trying in daytime.   GERD (REFLUX)  is an extremely common cause of respiratory symptoms just like yours , many times with no obvious heartburn at all.    It can be treated with medication, but also with lifestyle changes including elevation of the head of your bed (ideally with 6 -8inch blocks under the headboard of your bed),  Smoking cessation, avoidance of late meals, excessive alcohol, and avoid fatty foods, chocolate, peppermint, colas, red wine, and acidic juices such as orange juice.  NO MINT OR MENTHOL PRODUCTS SO NO COUGH DROPS  USE SUGARLESS CANDY INSTEAD (Jolley ranchers or Stover's or Life Savers) or even ice chips will also do - the key is to swallow to prevent all throat clearing. NO OIL BASED VITAMINS - use powdered substitutes.  Avoid fish oil when coughing.    Please remember to go to the  x-ray department  @  [facility] for your tests - we will call you with the results when they are available      Please schedule a follow up office visit in 6 weeks, call sooner if needed   Late add:  see ENT re wax impaction

## 2023-03-26 NOTE — Progress Notes (Signed)
[name], female    DOB: 08-12-1941    MRN: [id number]   Brief patient profile:  30  yowf from [place] healthy child but dx bronchitis about when she started smoking and  quit   2005 with cough resolved and  nl pfts 07/2018   referred to pulmonary clinic in [place]  03/26/2023 by Dr Wylene Simmer  for cough onset Jan 2024 in setting of ANCA pos vasculitis assoc with chronic anemia requiring rituximab and chronic prednisone rx around 2020 and weaned off March 2024.     History of Present Illness  03/26/2023  Pulmonary/ 1st office eval/ Camelia Stelzner / [place] [facility]  Chief Complaint  Patient presents with   Consult  Dyspnea:  rollator at home due to R hip pain  Cough: improved but has fits immediately hs / occ noct/ p stirs > clear mucus / occ at start of meal but no better on max ppi / H2  Sleep: bed is flat/ one pillow/ either side  SABA use: no 02: none   No obvious day to day or daytime pattern/variability or assoc  purulent sputum or mucus plugs or hemoptysis or cp or chest tightness, subjective wheeze or overt sinus or hb symptoms.     Also denies any obvious fluctuation of symptoms with weather or environmental changes or other aggravating or alleviating factors except as outlined above   No unusual exposure hx or h/o childhood pna/ asthma or knowledge of premature birth.  Current Allergies, Complete Past Medical History, Past Surgical History, Family History, and Social History were reviewed in [name].  ROS  The following are not active complaints unless bolded Hoarseness, sore throat, dysphagia/minimal , dental problems, itching, sneezing,  nasal congestion or discharge of excess mucus or purulent secretions, ear ache,   fever, chills, sweats, unintended wt loss or wt gain, classically pleuritic or exertional cp,  orthopnea pnd or arm/hand swelling  or leg swelling, presyncope, palpitations, abdominal pain, anorexia, nausea, vomiting, diarrhea   or change in bowel habits or change in bladder habits, change in stools or change in urine, dysuria, hematuria,  rash, arthralgias, visual complaints, headache, numbness, weakness or ataxia or problems with walking or coordination,  change in mood or  memory.             Past Medical History:  Diagnosis Date   Aortic atherosclerosis (HCC) 01/23/2018   Atrial fibrillation (HCC)    Benign positional vertigo 10/06/2011   Carotid artery disease (HCC)    Chemotherapy induced neutropenia (HCC) 07/21/2019   Cholelithiasis 01/23/2018   Cholelithiasis 01/23/2018   Chronic anticoagulation    Colovesical fistula, ruled out 04/14/2022   Coronary artery disease    status post coronary artery bypass grafting times 07/10/2004   Diabetes mellitus    Hypercholesteremia    Hypertension    Mechanical heart valve present    H. aortic valve replacement at the time of bypass surgery October 2005   Moderate to severe pulmonary hypertension (HCC)    Peripheral arterial disease (HCC)    history of left common iliac artery PTA and stenting for a chronic total occlusion 08/26/01   S/P cholecystectomy 03/06/2018    Outpatient Medications Prior to Visit  Medication Sig Dispense Refill   acetaminophen (TYLENOL) 325 MG tablet Take 325 mg by mouth every 6 (six) hours as needed for mild pain or fever.     allopurinol (ZYLOPRIM) 100 MG tablet Take 100 mg by mouth daily.  ALPRAZolam (XANAX) 0.5 MG tablet Take 0.5 mg by mouth daily.     amLODipine (NORVASC) 10 MG tablet Take 1 tablet (10 mg total) by mouth daily. 90 tablet 3   atorvastatin (LIPITOR) 80 MG tablet Take 1 tablet (80 mg total) by mouth daily at 6 PM.     budesonide (PULMICORT) 0.5 MG/2ML nebulizer solution Take 2 mLs (0.5 mg total) by nebulization 2 (two) times daily. 120 mL 1   Cholecalciferol (VITAMIN D3) 50 MCG (2000 UT) capsule Take 2,000 Units by mouth daily with lunch.     cloNIDine (CATAPRES) 0.2 MG tablet Take 0.2 mg by mouth 2 (two) times  daily.     Cyanocobalamin (VITAMIN B12) 1000 MCG TBCR Take 1,000 mcg by mouth daily with lunch.     famotidine (PEPCID) 20 MG tablet Take 20 mg by mouth 2 (two) times daily.     ferrous sulfate 325 (65 FE) MG tablet Take 1 tablet (325 mg total) by mouth daily with breakfast. (Patient taking differently: Patient takes 2 tablets by mouth daily.)  3   furosemide (LASIX) 40 MG tablet Patient takes 2 tablets by mouth in the morning and 1.5 tablets at night.     hydrALAZINE (APRESOLINE) 100 MG tablet Take 1 tablet (100 mg total) by mouth 3 (three) times daily. 270 tablet 3   insulin aspart (NOVOLOG) 100 UNIT/ML injection Inject 2-8 Units into the skin 3 (three) times daily with meals. Per sliding scale, If blood sugar is 200 to 250, give 2 units. If blood sugar is 251 to 300, give 4 units. If blood sugar is 301 to 350, give 6 units. If blood sugar is greater than 350, give 8 units and call MD.     Insulin Syringe-Needle U-100 (B-D INS SYR ULTRAFINE .3CC/29G) 29G X 1/2" 0.3 ML MISC Use for sliding scale for breakthrough high blood sugar readings 100 each 1   ipratropium-albuterol (DUONEB) 0.5-2.5 (3) MG/3ML SOLN Take 3 mLs by nebulization every 6 (six) hours as needed. 360 mL 0   metoprolol succinate (TOPROL-XL) 25 MG 24 hr tablet Take 1 tablet (25 mg total) by mouth daily. 30 tablet 3   ondansetron (ZOFRAN-ODT) 4 MG disintegrating tablet Take 4 mg by mouth every 8 (eight) hours as needed for nausea or vomiting.     pantoprazole (PROTONIX) 40 MG tablet Take 1 tablet (40 mg total) by mouth 2 (two) times daily. 60 tablet 2   senna-docusate (SENOKOT-S) 8.6-50 MG tablet Take 2 tablets by mouth at bedtime. 60 tablet 2   sertraline (ZOLOFT) 25 MG tablet Take 25 mg by mouth daily.     simethicone (GAS-X) 80 MG chewable tablet Chew 1 tablet (80 mg total) by mouth 4 (four) times daily as needed for flatulence. 100 tablet 2   tadalafil, PAH, (ADCIRCA) 20 MG tablet TAKE 2 TABLETS BY MOUTH ONCE DAILY 60 tablet 11    warfarin (COUMADIN) 5 MG tablet Take 1 tablet (5 mg total) by mouth See admin instructions. Take 1 tablet 5mg  on Tuesday/Thursday/Sunday and 1/2 Tab (2.5 mg) on MWFSat (Patient taking differently: Take 5 mg by mouth See admin instructions. Take 5 mg once daily. Pt's has not been checked in over a week. Per daughter)     No facility-administered medications prior to visit.     Objective:     BP (!) 132/56   Pulse (!) 55   Ht 4\' 11"  (1.499 m)   Wt 111 lb 3.2 oz (50.4 kg)   SpO2 96% Comment:  RA  BMI 22.46 kg/m   SpO2: 96 % (RA)  W/c bound frail wf nad    HEENT : Oropharynx  clear/ full dentures       Ears impacted bilaterally dry wax encountered but no cough reflex.    NECK :  without  apparent JVD/ palpable Nodes/TM    LUNGS: no acc muscle use,  Nl contour chest which is clear to A and P bilaterally without cough on insp or exp maneuvers   CV:  RRR  mechanical S2 with slt increase  in P2, and 1+ pitting bilateral LE edema   ABD:  soft and nontender   MS: ext warm without deformities Or obvious joint restrictions  calf tenderness, cyanosis or clubbing    SKIN: warm and dry without lesions    NEURO:  alert, approp, nl sensorium with  no motor or cerebellar deficits apparent.    CXR PA and Lateral:   03/26/2023 :    I personally reviewed images and impression is as follows:     CM/ non-specific markings but much improved aeration vs 01/21/23      Assessment   Chronic cough Onset Jan 2024 refractory to acid suppression/ most symptomatic at [facility] assoc with ear wax impaction Bilaterally - 03/26/2023 rec bed blocks/ continued max gerd rx then if persist try  1st gen H1 blockers per guidelines   Cough at bedtime refractory to steroids (which she was on at onset) but not assoc with over sinus dz is LPR/gerd related until proven otherwise so will ask her to continue max acid suppression plus bed blocks and then add  1st gen H1 blockers per guidelines and have her see ENT re wax  disimpaction (may stimulate vagus nerve which also may contribute to cyclical coughing)   Ok to wean down/ off pulmicort and just use douneb qid prn as this is very unlikely copd related CB or other late complication of copd.  In fact, when respiratory symptoms begin or become refractory well after a patient reports complete smoking cessation,  Especially when this wasn't the case while they were smoking, a red flag is raised based on the work of Dr Primitivo Gauze which states:  if you quit smoking when your best day FEV1 is still well preserved it is highly unlikely you will progress to severe disease.  That is to say, once the smoking stops,  the symptoms should not suddenly erupt or markedly worsen.  If so, the differential diagnosis should include  obesity/deconditioning,  LPR/Reflux/Aspiration syndromes,  occult CHF, or  especially side effect of medications commonly used in this population (none of the usual suspects apply here, if the [facility] is correct).   F/u in 6weeks on above rx.  Advised: The standardized cough guidelines published in Chest by [place] in 2006 are still the best available and consist of a multiple step process (up to 12!) , not a single office visit,  and are intended  to address this problem logically,  with an alogrithm dependent on response to empiric treatment at  each progressive step  to determine a specific diagnosis with  minimal addtional testing needed. Therefore if adherence is an issue or can't be accurately verified,  it's very unlikely the standard evaluation and treatment will be successful here.    Furthermore, response to therapy (other than acute cough suppression, which should only be used short term with avoidance of narcotic containing cough syrups if possible), can be a gradual process for which the patient is  not likely to  perceive immediate benefit.  Unlike going to an eye doctor where the best perscription is almost always the first one and is  immediately effective, this is almost never the case in the management of chronic cough syndromes. Therefore the patient needs to commit up front to consistently adhere to recommendations  for up to 6 weeks of therapy directed at the likely underlying problem(s) before the response can be reasonably evaluated.           Each maintenance medication was reviewed in detail including emphasizing most importantly the difference between maintenance and prns and under what circumstances the prns are to be triggered using an action plan format where appropriate.  Total time for H and P, chart review, counseling, reviewing neb  device(s) and generating customized AVS unique to this office visit / same day charting  > 60 min for new extremely complex pt with refractory resp symptoms of unknown origin.             Sandrea Hughs, MD 03/26/2023

## 2023-03-26 NOTE — Assessment & Plan Note (Addendum)
Onset Jan 2024 refractory to acid suppression/ most symptomatic at [facility] with ear wax impaction Bilaterally - 03/26/2023 rec bed blocks/ continued max gerd rx then if persist try  1st gen H1 blockers per guidelines   Cough at bedtime refractory to steroids (which she was on at onset) but not assoc with over sinus dz is LPR/gerd related until proven otherwise so will ask her to continue max acid suppression plus bed blocks and then add  1st gen H1 blockers per guidelines and have her see ENT re wax disimpaction (may stimulate vagus nerve which also may contribute to cyclical coughing)   Ok to wean down/ off pulmicort and just use douneb qid prn as this is very unlikely copd related CB or other late complication of copd.  In fact, when respiratory symptoms begin or become refractory well after a patient reports complete smoking cessation,  Especially when this wasn't the case while they were smoking, a red flag is raised based on the work of Dr Primitivo Gauze which states:  if you quit smoking when your best day FEV1 is still well preserved it is highly unlikely you will progress to severe disease.  That is to say, once the smoking stops,  the symptoms should not suddenly erupt or markedly worsen.  If so, the differential diagnosis should include  obesity/deconditioning,  LPR/Reflux/Aspiration syndromes,  occult CHF, or  especially side effect of medications commonly used in this population (none of the usual suspects apply here, if the [facility] is correct).   F/u in 6weeks on above rx.  Advised: The standardized cough guidelines published in Chest by [place] in 2006 are still the best available and consist of a multiple step process (up to 12!) , not a single office visit,  and are intended  to address this problem logically,  with an alogrithm dependent on response to empiric treatment at  each progressive step  to determine a specific diagnosis with  minimal addtional testing needed. Therefore if  adherence is an issue or can't be accurately verified,  it's very unlikely the standard evaluation and treatment will be successful here.    Furthermore, response to therapy (other than acute cough suppression, which should only be used short term with avoidance of narcotic containing cough syrups if possible), can be a gradual process for which the patient is not likely to  perceive immediate benefit.  Unlike going to an eye doctor where the best perscription is almost always the first one and is immediately effective, this is almost never the case in the management of chronic cough syndromes. Therefore the patient needs to commit up front to consistently adhere to recommendations  for up to 6 weeks of therapy directed at the likely underlying problem(s) before the response can be reasonably evaluated.           Each maintenance medication was reviewed in detail including emphasizing most importantly the difference between maintenance and prns and under what circumstances the prns are to be triggered using an action plan format where appropriate.  Total time for H and P, chart review, counseling, reviewing neb  device(s) and generating customized AVS unique to this office visit / same day charting  > 60 min for new extremely complex pt with refractory resp symptoms of unknown origin.

## 2023-03-27 ENCOUNTER — Ambulatory Visit: Payer: Self-pay

## 2023-03-27 NOTE — Patient Outreach (Addendum)
  Care Coordination   Follow Up Visit Note   03/27/2023 Name: [name] MRN: [id number] DOB: Feb 08, 1941  [name] is a 82 y.o. year old female who sees [name], [name], MD for primary care. I spoke with daughter [name] by phone today.  What matters to the patients health and wellness today?  Daughter [name] would like for her mother to stay healthy and continue to remain as independent as possible.     Goals Addressed               This Visit's Progress     Patient Stated     COMPLETED: I need help knowing how much protein to eat/drink (pt-stated)        Care Coordination Interventions: Completed successful outbound call with daughter [name]  Discussed patient is eating 3 regular meals daily and drinking protein shakes in between meals, daughter Tioga [name] feels her appetite is very good and she is maintaining her weight  Determined daughter [name] voices having no complaints today related to her mother's nutritional status Reiterated the importance of patient staying well hydrated with water, aiming for 48-64 oz daily unless otherwise directed       Other     RN Care Coordination Activities: further follow up needed        Care Coordination Interventions: Evaluation of current treatment plan related to bilateral edema to lower extremities and patient's adherence to plan as established by provider Spoke with patient's daughter [name] Determined patient continues to progress towards wellness, daughter feels she is getting stronger and starting resuming normal activities such as sewing  Determined patient has completed her Echocardiogram, she f/u with both Cardiology and Pulmonology since last RN contact Review of patient status, including review of consultant's reports, relevant laboratory and other test results, and medications completed Reviewed with daughter next scheduled upcoming MD appointments and confirmed patient's daughter and or son will attend her appointments  with her     Interventions Today    Flowsheet Row Most Recent Value  Chronic Disease   Chronic disease during today's visit Congestive Heart Failure (CHF), Other  General Interventions   General Interventions Discussed/Reviewed General Interventions Discussed, General Interventions Reviewed, Doctor Visits  Doctor Visits Discussed/Reviewed Doctor Visits Discussed, Doctor Visits Reviewed, Specialist  Education Interventions   Education Provided Provided Education  Provided Verbal Education On Nutrition, When to see the doctor  Nutrition Interventions   Nutrition Discussed/Reviewed Nutrition Discussed, Nutrition Reviewed, Increasing proteins  Pharmacy Interventions   Pharmacy Dicussed/Reviewed Pharmacy Topics Reviewed, Pharmacy Topics Discussed          SDOH assessments and interventions completed:  No     Care Coordination Interventions:  Yes, provided   Follow up plan: Follow up call scheduled for 05/09/23 @09 :30 AM    Encounter Outcome:  Pt. Visit Completed

## 2023-03-27 NOTE — Patient Instructions (Signed)
Visit Information  Thank you for taking time to visit with me today. Please don't hesitate to contact me if I can be of assistance to you.   Following are the goals we discussed today:   Goals Addressed               This Visit's Progress     Patient Stated     COMPLETED: I need help knowing how much protein to eat/drink (pt-stated)        Care Coordination Interventions: Completed successful outbound call with daughter [place] Sink  Discussed patient is eating 3 regular meals daily and drinking protein shakes in between meals, daughter [place] Sink feels her appetite is very good and she is maintaining her weight  Determined daughter [place] Sink voices having no complaints today related to her mother's nutritional status Reiterated the importance of patient staying well hydrated with water, aiming for 48-64 oz daily unless otherwise directed       Other     RN Care Coordination Activities: further follow up needed        Care Coordination Interventions: Evaluation of current treatment plan related to bilateral edema to lower extremities and patient's adherence to plan as established by provider Spoke with patient's daughter [name] Determined patient continues to progress towards wellness, daughter feels she is getting stronger and starting resuming normal activities such as sewing  Determined patient has completed her Echocardiogram, she f/u with both Cardiology and Pulmonology since last RN contact Review of patient status, including review of consultant's reports, relevant laboratory and other test results, and medications completed Reviewed with daughter next scheduled upcoming MD appointments and confirmed patient's daughter and or son will attend her appointments with her         Our next appointment is by telephone on 05/09/23 at 09:30 AM  Please call the care guide team at [phone] if you need to cancel or reschedule your appointment.   If you are experiencing a Mental Health or  Behavioral Health Crisis or need someone to talk to, please call 1-800-273-TALK (toll free, 24 hour hotline)  Patient verbalizes understanding of instructions and care plan provided today and agrees to view in MyChart. Active MyChart status and patient understanding of how to access instructions and care plan via MyChart confirmed with patient.     Delsa Sale, RN, BSN, CCM Care Management Coordinator [facility] Care Management  Direct Phone: [phone]

## 2023-03-28 ENCOUNTER — Ambulatory Visit: Payer: Medicare HMO | Attending: Cardiology | Admitting: *Deleted

## 2023-03-28 DIAGNOSIS — Z952 Presence of prosthetic heart valve: Secondary | ICD-10-CM | POA: Diagnosis not present

## 2023-03-28 DIAGNOSIS — Z5181 Encounter for therapeutic drug level monitoring: Secondary | ICD-10-CM | POA: Diagnosis not present

## 2023-03-28 LAB — POCT INR: INR: 1.7 — AB (ref 2.0–3.0)

## 2023-03-28 NOTE — Patient Instructions (Signed)
Increase warfarin to 1 1/2 tablets daily except 1 tablet on Sundays, Tuesdays and Thursdays  Recheck INR in 2 weeks in [place].  On 1 more week of Doxyxycline

## 2023-03-29 ENCOUNTER — Inpatient Hospital Stay: Payer: Medicare HMO

## 2023-03-29 ENCOUNTER — Encounter: Payer: Self-pay | Admitting: Adult Health

## 2023-03-29 ENCOUNTER — Inpatient Hospital Stay (HOSPITAL_BASED_OUTPATIENT_CLINIC_OR_DEPARTMENT_OTHER): Payer: Medicare HMO | Admitting: Adult Health

## 2023-03-29 ENCOUNTER — Other Ambulatory Visit: Payer: Self-pay

## 2023-03-29 VITALS — BP 116/77 | HR 53 | Temp 97.4°F | Resp 15 | Wt 111.7 lb

## 2023-03-29 DIAGNOSIS — D638 Anemia in other chronic diseases classified elsewhere: Secondary | ICD-10-CM | POA: Diagnosis not present

## 2023-03-29 DIAGNOSIS — R7402 Elevation of levels of lactic acid dehydrogenase (LDH): Secondary | ICD-10-CM | POA: Diagnosis not present

## 2023-03-29 DIAGNOSIS — D591 Autoimmune hemolytic anemia, unspecified: Secondary | ICD-10-CM | POA: Diagnosis not present

## 2023-03-29 DIAGNOSIS — N289 Disorder of kidney and ureter, unspecified: Secondary | ICD-10-CM | POA: Diagnosis not present

## 2023-03-29 DIAGNOSIS — Z5181 Encounter for therapeutic drug level monitoring: Secondary | ICD-10-CM

## 2023-03-29 DIAGNOSIS — N184 Chronic kidney disease, stage 4 (severe): Secondary | ICD-10-CM | POA: Diagnosis not present

## 2023-03-29 DIAGNOSIS — Z952 Presence of prosthetic heart valve: Secondary | ICD-10-CM

## 2023-03-29 DIAGNOSIS — M8580 Other specified disorders of bone density and structure, unspecified site: Secondary | ICD-10-CM | POA: Diagnosis not present

## 2023-03-29 DIAGNOSIS — I7782 Antineutrophilic cytoplasmic antibody (ANCA) vasculitis: Secondary | ICD-10-CM | POA: Diagnosis not present

## 2023-03-29 DIAGNOSIS — D500 Iron deficiency anemia secondary to blood loss (chronic): Secondary | ICD-10-CM

## 2023-03-29 DIAGNOSIS — D631 Anemia in chronic kidney disease: Secondary | ICD-10-CM | POA: Diagnosis not present

## 2023-03-29 LAB — COMPREHENSIVE METABOLIC PANEL
ALT: 17 U/L (ref 0–44)
AST: 32 U/L (ref 15–41)
Albumin: 3.7 g/dL (ref 3.5–5.0)
Alkaline Phosphatase: 115 U/L (ref 38–126)
Anion gap: 12 (ref 5–15)
BUN: 112 mg/dL — ABNORMAL HIGH (ref 8–23)
CO2: 23 mmol/L (ref 22–32)
Calcium: 9 mg/dL (ref 8.9–10.3)
Chloride: 98 mmol/L (ref 98–111)
Creatinine, Ser: 2.97 mg/dL — ABNORMAL HIGH (ref 0.44–1.00)
GFR, Estimated: 15 mL/min — ABNORMAL LOW (ref >60)
Glucose, Bld: 153 mg/dL — ABNORMAL HIGH (ref 70–99)
Potassium: 4.3 mmol/L (ref 3.5–5.1)
Sodium: 133 mmol/L — ABNORMAL LOW (ref 135–145)
Total Bilirubin: 0.5 mg/dL (ref 0.3–1.2)
Total Protein: 5.8 g/dL — ABNORMAL LOW (ref 6.5–8.1)

## 2023-03-29 LAB — CBC WITH DIFFERENTIAL/PLATELET
Abs Immature Granulocytes: 0.01 10*3/uL (ref 0.00–0.07)
Basophils Absolute: 0.1 10*3/uL (ref 0.0–0.1)
Basophils Relative: 1 %
Eosinophils Absolute: 0.3 10*3/uL (ref 0.0–0.5)
Eosinophils Relative: 5 %
HCT: 29.9 % — ABNORMAL LOW (ref 36.0–46.0)
Hemoglobin: 9.8 g/dL — ABNORMAL LOW (ref 12.0–15.0)
Immature Granulocytes: 0 %
Lymphocytes Relative: 26 %
Lymphs Abs: 1.4 10*3/uL (ref 0.7–4.0)
MCH: 29.3 pg (ref 26.0–34.0)
MCHC: 32.8 g/dL (ref 30.0–36.0)
MCV: 89.3 fL (ref 80.0–100.0)
Monocytes Absolute: 0.4 10*3/uL (ref 0.1–1.0)
Monocytes Relative: 8 %
Neutro Abs: 3.4 10*3/uL (ref 1.7–7.7)
Neutrophils Relative %: 60 %
Platelets: 113 10*3/uL — ABNORMAL LOW (ref 150–400)
RBC: 3.35 MIL/uL — ABNORMAL LOW (ref 3.87–5.11)
RDW: 16 % — ABNORMAL HIGH (ref 11.5–15.5)
WBC: 5.6 10*3/uL (ref 4.0–10.5)
nRBC: 0 % (ref 0.0–0.2)

## 2023-03-29 MED ORDER — EPOETIN ALFA-EPBX 40000 UNIT/ML IJ SOLN
40000.0000 [IU] | Freq: Once | INTRAMUSCULAR | Status: AC
  Administered 2023-03-29: 40000 [IU] via SUBCUTANEOUS
  Filled 2023-03-29: qty 1

## 2023-03-29 NOTE — Progress Notes (Signed)
[facility] Follow up:    [name], [name], MD [address] [place] [address]   DIAGNOSIS:   SUMMARY OF HEMATOLOGIC HISTORY: [name] is a 82 y.o. [place] woman with multifactorial anemia, as follows:             (a) anemia of chronic inflammation: As of May 2020 she has a SED rate of 95, CRP 1.1, positive ANA and RF; subsequently she was found to be ANCA positive             (b) hemolysis (mild): she has a mildly elevated LDH and a DAT positive for complement, not IgG; this is c/w (a) above             (c) anemia of renal insufficiency: inadequate EPO resonse to anemia                         (d) GIB/ iron deficiency in patient on lifelong anticoagulaton   (1) Feraheme received 03/01/2019, repeated 03/20/2019              (a) reticulocyte 03/07/2019 up from 43.4 a year ago to 191.8 after iron infusion             (b) feraheme repeated on 05/12/2019             (c) subsequent ferritin levels >100   (2) darbepoetin: starting 05/05/2019 at given every 2 weeks             (a) dose increased to 200 mcg every week starting 07/01/2019              (b) back to every 2 weeks beginning 08/04/2019 still at 200 mcg dose             (c) switched to Retacrit every 4 weeks as of 08/18/2019   (3) ANCA positive vasculitis: diagnosed 05/08/2019 (titer 1:640)             (a) prednisone 40 mg daily started 05/31/2019             (b) rituximab weekly started 06/12/2019 (received test dose 06/05/2019)                         (i) hepatitis B sAg, core Ab and HIV negative 05/08/2019                         (ii) rituximab held after 07/14/2019 dose (received 5 weekly doses) with neutropenia                         (iii) rituximab resumed 08/18/2019 but changed to monthly                         (iv) rituximab changed to every 2 months beginning with 02/02/2020 dose                         (v) rituximab changed to every 12 weeks after the 10/12/2020 dose              (c) prednisone tapered to off as of 09/15/2019                          (4) Anemia worsened in spring 2023, refractory  to Rituximab and requiring prednisone restart with slow taper due currently at 20mg  which she will stay at.   (5) osteopenia with a T score of -1.5 on bone density 02/28/2016 at the [facility] center  CURRENT THERAPY: Retacrit  INTERVAL HISTORY: [name] 82 y.o. female returns for f/u of her anemia.  She receives retacrit every 3 weeks.  Today her hemoglobin is 9.8.  She denies any difficulty with receiving the retacrit.  She endorses mild fatigue.     Patient Active Problem List   Diagnosis Date Noted   Chronic cough 03/26/2023   Protein-calorie malnutrition, severe 12/26/2022   Acute GI bleeding 12/24/2022   Coagulopathy (HCC) 12/24/2022   Melena 12/24/2022   Duodenitis 12/24/2022   Noninfectious gastroenteritis 12/24/2022   CAD, multiple vessel 12/24/2022   Acute on chronic heart failure with preserved ejection fraction (HFpEF) (HCC) 08/29/2022   Acute exacerbation of CHF (congestive heart failure) (HCC) 08/29/2022   Acute urinary retention 08/28/2022   Acute kidney injury superimposed on chronic kidney disease (HCC) 08/27/2022   Constipation 08/27/2022   Nausea and vomiting 08/27/2022   Hypoalbuminemia due to protein-calorie malnutrition (HCC) 08/27/2022   Type 2 diabetes mellitus with hyperglycemia (HCC) 08/27/2022   Palliative care encounter    Goals of care, counseling/discussion    Encounter for therapeutic drug monitoring    GI bleed 07/09/2022   GI bleeding 07/08/2022   Acute on chronic anemia 07/08/2022   History of Clostridioides difficile colitis 07/08/2022   DNR (do not resuscitate)/DNI(Do Not Intubate) 07/08/2022   Anemia of chronic disease 07/07/2022   Pressure injury of skin 06/23/2022   UTI (urinary tract infection) 06/22/2022   Joint pain 06/09/2022   Parietoalveolar pneumopathy (HCC) 06/09/2022   Primary osteoarthritis 06/09/2022    Rheumatoid factor positive 06/09/2022   C. difficile colitis 04/19/2022   Colitis 04/18/2022   PAH (pulmonary artery hypertension) (HCC) 04/18/2022   Emphysematous cystitis 04/13/2022   Female bladder prolapse 04/13/2022   Diverticulitis 04/12/2022   Autoimmune hemolytic anemia (HCC) 01/02/2022   Bilateral lower extremity edema 01/02/2022   Refractory anemia (HCC) 12/15/2021   Abnormal chest x-ray 12/15/2021   Supratherapeutic INR 12/15/2021   Elevated troponin 12/15/2021   Unilateral primary osteoarthritis, right hip 05/31/2021   Midline cystocele 05/30/2021   Primary localized osteoarthritis of pelvic region and thigh 05/30/2021   Vaginal pain 05/30/2021   Asymmetric SNHL (sensorineural hearing loss) 01/06/2021   Referred otalgia of both ears 01/06/2021   Bilateral hearing loss 08/31/2020   Low back pain 02/20/2020   CKD (chronic kidney disease), stage IV (HCC) 10/13/2019   Leukopenia due to antineoplastic chemotherapy (HCC) 08/18/2019   Thrombophilia (HCC) 06/19/2019   Encounter for general adult medical examination without abnormal findings 06/13/2019   Arteritis (HCC) 06/06/2019   Hypertensive heart and chronic kidney disease with heart failure and stage 1 through stage 4 chronic kidney disease, or unspecified chronic kidney disease (HCC) 06/06/2019   Muscle weakness 06/06/2019   Pancytopenia (HCC) 05/28/2019   Dyspnea 05/28/2019   Vasculitis, ANCA positive 05/28/2019   Disorder of connective tissue (HCC) 05/14/2019   Renal insufficiency 05/08/2019   PAF (paroxysmal atrial fibrillation) (HCC) 04/07/2019   Iron deficiency anemia 02/28/2019   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 (HCC) 02/28/2019   Diabetic retinopathy associated with type 2 diabetes mellitus (HCC) 09/02/2018   Chronic pain 06/19/2018   Non-thrombocytopenic purpura (HCC) 05/21/2018   Gout 03/22/2018   Malnutrition of moderate degree 03/12/2018   Diabetes mellitus type 2,  controlled (HCC) 03/06/2018   Chronic atrial fibrillation (HCC) 03/06/2018   H/O mechanical aortic valve replacement 03/06/2018   Moderate to severe pulmonary hypertension (HCC) 03/06/2018   GERD without esophagitis 02/21/2018   Anxiety 02/21/2018   Chronic diastolic CHF (congestive heart failure) (HCC) 02/21/2018   General weakness 02/02/2018   Thrombocytopenia (HCC) 01/25/2018   Arm pain, diffuse 01/25/2018   Anemia 01/23/2018   Aortic atherosclerosis (HCC) 01/23/2018   Age-related osteoporosis without current pathological fracture 05/24/2017   Carotid artery occlusion 05/24/2017   Generalized anxiety disorder 05/24/2017   Hearing loss 05/24/2017   Presence of prosthetic heart valve 05/24/2017   Carotid artery disease (HCC) 08/18/2013   Peripheral arterial disease (HCC) 08/18/2013   Diabetes mellitus without complication (HCC) 08/18/2013   Essential hypertension    Hypercholesteremia    Mechanical heart valve present    Chronic anticoagulation     is allergic to augmentin [amoxicillin-pot clavulanate], ciprofloxacin, codeine, coreg [carvedilol], diflucan [fluconazole], levofloxacin in d5w, losartan, privigen [immune globulin (human)], sulfamethoxazole-trimethoprim, verapamil, zetia [ezetimibe], zocor [simvastatin - high dose], and gatifloxacin.  MEDICAL HISTORY: Past Medical History:  Diagnosis Date   Aortic atherosclerosis (HCC) 01/23/2018   Atrial fibrillation (HCC)    Benign positional vertigo 10/06/2011   Carotid artery disease (HCC)    Chemotherapy induced neutropenia (HCC) 07/21/2019   Cholelithiasis 01/23/2018   Cholelithiasis 01/23/2018   Chronic anticoagulation    Colovesical fistula, ruled out 04/14/2022   Coronary artery disease    status post coronary artery bypass grafting times 07/10/2004   Diabetes mellitus    Hypercholesteremia    Hypertension    Mechanical heart valve present    H. aortic valve replacement at the time of bypass surgery October 2005    Moderate to severe pulmonary hypertension (HCC)    Peripheral arterial disease (HCC)    history of left common iliac artery PTA and stenting for a chronic total occlusion 08/26/01   S/P cholecystectomy 03/06/2018    SURGICAL HISTORY: Past Surgical History:  Procedure Laterality Date   anterior repair  2009   AORTIC VALVE REPLACEMENT  2005   Dr. Laneta Simmers   CARDIAC CATHETERIZATION  11/10/2004   40% right common illiac, 70% in stent restenosis of distal left common illiac,    CARDIAC CATHETERIZATION  05/18/2004   LAD 50-70% midstenosis, RCA dominant w/50% stenosis, 50% Right common Illiac artery ostial stenosis, 90% in stent restenosis within midportion of left common illiac stent   Carotid Duplex  03/12/2012   RSA-elev. velocities suggestive of a 50-69% diameter reduction, Right&Left Bulb/Prox ICA-mild-mod.fibrous plaqueelevating Velocities abnormal study.   CHOLECYSTECTOMY N/A 03/01/2018   Procedure: LAPAROSCOPIC CHOLECYSTECTOMY;  Surgeon: Jimmye Norman, MD;  Location: [facility] OR;  Service: General;  Laterality: N/A;   COLONOSCOPY WITH PROPOFOL N/A 01/22/2018   Procedure: COLONOSCOPY WITH PROPOFOL;  Surgeon: Charlott Rakes, MD;  Location: [facility] ENDOSCOPY;  Service: Endoscopy;  Laterality: N/A;   ESOPHAGOGASTRODUODENOSCOPY N/A 07/13/2022   Procedure: ESOPHAGOGASTRODUODENOSCOPY (EGD);  Surgeon: Kerin Salen, MD;  Location: [facility] ENDOSCOPY;  Service: Gastroenterology;  Laterality: N/A;   ESOPHAGOGASTRODUODENOSCOPY (EGD) WITH PROPOFOL N/A 01/22/2018   Procedure: ESOPHAGOGASTRODUODENOSCOPY (EGD) WITH PROPOFOL;  Surgeon: Charlott Rakes, MD;  Location: [facility] ENDOSCOPY;  Service: Endoscopy;  Laterality: N/A;   ESOPHAGOGASTRODUODENOSCOPY (EGD) WITH PROPOFOL N/A 11/21/2018   Procedure: ESOPHAGOGASTRODUODENOSCOPY (EGD) WITH PROPOFOL;  Surgeon: Kerin Salen, MD;  Location: [facility] ENDOSCOPY;  Service: Gastroenterology;  Laterality: N/A;   ESOPHAGOGASTRODUODENOSCOPY (EGD) WITH PROPOFOL Left 02/27/2019   Procedure:  ESOPHAGOGASTRODUODENOSCOPY (EGD) WITH PROPOFOL;  Surgeon: Willis Modena, MD;  Location: [facility] ENDOSCOPY;  Service: Endoscopy;  Laterality: Left;   GIVENS CAPSULE STUDY N/A 11/21/2018   Procedure: GIVENS CAPSULE STUDY;  Surgeon: Kerin Salen, MD;  Location: [facility] ENDOSCOPY;  Service: Gastroenterology;  Laterality: N/A;  To be deployed during EGD   Lower Ext. Duplex  03/12/2012   Right Proximal CIA- vessel narrowing w/elevated velocities 0-49% diameter reduction. Right SFA-mild mixed density plaque throughout vessel.   NM MYOCAR PERF WALL MOTION  05/19/2010   protocol: Persantine, post stress EF 65%, negative for ischemia, low risk scan   RIGHT HEART CATH N/A 06/27/2018   Procedure: RIGHT HEART CATH;  Surgeon: Laurey Morale, MD;  Location: [facility] INVASIVE CV LAB;  Service: Cardiovascular;  Laterality: N/A;   TOTAL ABDOMINAL HYSTERECTOMY W/ BILATERAL SALPINGOOPHORECTOMY  1989   TRANSTHORACIC ECHOCARDIOGRAM  08/29/2012   Moderately calcified annulus of mitral valve, moderate regurg. of both mitral valve and tricuspid valve.     SOCIAL HISTORY: Social History   Socioeconomic History   Marital status: Widowed    Spouse name: Not on file   Number of children: Not on file   Years of education: Not on file   Highest education level: Not on file  Occupational History   Occupation: Retired  Tobacco Use   Smoking status: Former    Packs/day: 1.00    Years: 30.00    Additional pack years: 0.00    Total pack years: 30.00    Types: Cigarettes   Smokeless tobacco: Never   Tobacco comments:    quit smoking 2005  Vaping Use   Vaping Use: Never used  Substance and Sexual Activity   Alcohol use: No    Alcohol/week: 0.0 standard drinks of alcohol   Drug use: No   Sexual activity: Not Currently    Birth control/protection: None  Other Topics Concern   Not on file  Social History Narrative   Not on file   Social Determinants of Health   Financial Resource Strain: Low Risk  (02/28/2022)   Overall  Financial Resource Strain (CARDIA)    Difficulty of Paying Living Expenses: Not hard at all  Food Insecurity: No Food Insecurity (12/25/2022)   Hunger Vital Sign    Worried About Running Out of Food in the Last Year: Never true    Ran Out of Food in the Last Year: Never true  Transportation Needs: No Transportation Needs (12/25/2022)   PRAPARE - Administrator, Civil Service (Medical): No    Lack of Transportation (Non-Medical): No  Physical Activity: Inactive (02/28/2022)   Exercise Vital Sign    Days of Exercise per Week: 0 days    Minutes of Exercise per Session: 0 min  Stress: No Stress Concern Present (02/28/2022)   [employer] of Occupational Health - Occupational Stress Questionnaire    Feeling of Stress : Only a little  Social Connections: Moderately Integrated (06/22/2022)   Social Connection and Isolation Panel [NHANES]    Frequency of Communication with Friends and Family: More than three times a week    Frequency of Social Gatherings with Friends and Family: More than three times a week    Attends Religious Services: More than 4 times per year    Active Member of [employer] or Organizations: Yes    Attends Banker Meetings: More than 4 times per year    Marital Status: Widowed  Intimate Partner Violence: Not At Risk (12/25/2022)   Humiliation, Afraid, Rape, and Kick questionnaire    Fear of Current or  Ex-Partner: No    Emotionally Abused: No    Physically Abused: No    Sexually Abused: No    FAMILY HISTORY: Family History  Problem Relation Age of Onset   Heart disease Father    Hypertension Father    Hyperlipidemia Father    Breast cancer Neg Hx     Review of Systems  Constitutional:  Positive for fatigue. Negative for appetite change, chills, fever and unexpected weight change.  HENT:   Negative for hearing loss, lump/mass and trouble swallowing.   Eyes:  Negative for eye problems and icterus.  Respiratory:  Negative for chest tightness,  cough and shortness of breath.   Cardiovascular:  Negative for chest pain, leg swelling and palpitations.  Gastrointestinal:  Negative for abdominal distention, abdominal pain, constipation, diarrhea, nausea and vomiting.  Endocrine: Negative for hot flashes.  Genitourinary:  Negative for difficulty urinating.   Musculoskeletal:  Negative for arthralgias.  Skin:  Negative for itching and rash.  Neurological:  Negative for dizziness, extremity weakness, headaches and numbness.  Hematological:  Negative for adenopathy. Does not bruise/bleed easily.  Psychiatric/Behavioral:  Negative for depression. The patient is not nervous/anxious.       PHYSICAL EXAMINATION   Onc Performance Status - 03/29/23 1325       ECOG Perf Status   ECOG Perf Status Restricted in physically strenuous activity but ambulatory and able to carry out work of a light or sedentary nature, e.g., light house work, office work      KPS SCALE   KPS % SCORE Cares for self, unable to carry on normal activity or to do active work             Vitals:   03/29/23 1319  BP: 116/77  Pulse: (!) 53  Resp: 15  Temp: (!) 97.4 F (36.3 C)  SpO2: 100%    Physical Exam Constitutional:      General: She is not in acute distress.    Appearance: Normal appearance. She is not toxic-appearing.     Comments: Examined in wheelchair   HENT:     Head: Normocephalic and atraumatic.     Mouth/Throat:     Mouth: Mucous membranes are moist.     Pharynx: Oropharynx is clear. No oropharyngeal exudate or posterior oropharyngeal erythema.  Eyes:     General: No scleral icterus. Cardiovascular:     Rate and Rhythm: Normal rate and regular rhythm.     Pulses: Normal pulses.     Heart sounds: Normal heart sounds.  Pulmonary:     Effort: Pulmonary effort is normal.     Breath sounds: Normal breath sounds.  Abdominal:     General: Abdomen is flat. Bowel sounds are normal. There is no distension.     Palpations: Abdomen is soft.      Tenderness: There is no abdominal tenderness.  Musculoskeletal:        General: No swelling.     Cervical back: Neck supple.  Lymphadenopathy:     Cervical: No cervical adenopathy.  Skin:    General: Skin is warm and dry.     Findings: No rash.  Neurological:     General: No focal deficit present.     Mental Status: She is alert.  Psychiatric:        Mood and Affect: Mood normal.        Behavior: Behavior normal.     LABORATORY DATA:  CBC    Component Value Date/Time   WBC 5.6 03/29/2023  1246   RBC 3.35 (L) 03/29/2023 1246   HGB 9.8 (L) 03/29/2023 1246   HGB 11.9 (L) 11/10/2022 1225   HCT 29.9 (L) 03/29/2023 1246   HCT 28.5 (L) 11/20/2018 1824   PLT 113 (L) 03/29/2023 1246   PLT 153 11/10/2022 1225   MCV 89.3 03/29/2023 1246   MCH 29.3 03/29/2023 1246   MCHC 32.8 03/29/2023 1246   RDW 16.0 (H) 03/29/2023 1246   LYMPHSABS 1.4 03/29/2023 1246   MONOABS 0.4 03/29/2023 1246   EOSABS 0.3 03/29/2023 1246   BASOSABS 0.1 03/29/2023 1246    CMP     Component Value Date/Time   NA 133 (L) 03/29/2023 1246   NA 137 12/06/2017 1436   K 4.3 03/29/2023 1246   CL 98 03/29/2023 1246   CO2 23 03/29/2023 1246   GLUCOSE 153 (H) 03/29/2023 1246   BUN 112 (H) 03/29/2023 1246   BUN 20 12/06/2017 1436   CREATININE 2.97 (H) 03/29/2023 1246   CREATININE 2.32 (H) 12/08/2022 1208   CALCIUM 9.0 03/29/2023 1246   PROT 5.8 (L) 03/29/2023 1246   ALBUMIN 3.7 03/29/2023 1246   AST 32 03/29/2023 1246   AST 60 (H) 12/08/2022 1208   ALT 17 03/29/2023 1246   ALT 42 12/08/2022 1208   ALKPHOS 115 03/29/2023 1246   BILITOT 0.5 03/29/2023 1246   BILITOT 0.7 12/08/2022 1208   GFRNONAA 15 (L) 03/29/2023 1246   GFRNONAA 20 (L) 12/08/2022 1208   GFRAA 32 (L) 03/30/2020 0932         ASSESSMENT and THERAPY PLAN:   Anemia of chronic disease Suzelle is here for follow-up of her anemia of chronic disease today.  She continues on retacrit 40,000 units every 2 weeks for a hemoglobin less  than 10.    Today her hemoglobin is 9.8.  She will proceed with Retacrit today.  She is tolerating this well.    We will see Sol back in 2 weeks for labs and an injection and in 4 weeks for labs, f/u and injection.    All questions were answered. The patient knows to call the clinic with any problems, questions or concerns. We can certainly see the patient much sooner if necessary.  Total encounter time:20 minutes*in face-to-face visit time, chart review, lab review, care coordination, order entry, and documentation of the encounter time.    Lillard Anes, NP 03/29/23 2:34 PM Medical Oncology and Hematology [facility] [address] [place], [place] [address] Tel. [phone]    Fax. [phone]  *Total Encounter Time as defined by the Centers for Medicare and Medicaid Services includes, in addition to the face-to-face time of a patient visit (documented in the note above) non-face-to-face time: obtaining and reviewing outside history, ordering and reviewing medications, tests or procedures, care coordination (communications with other health care professionals or caregivers) and documentation in the medical record.

## 2023-03-29 NOTE — Assessment & Plan Note (Signed)
[name] is here for follow-up of her anemia of chronic disease today.  She continues on retacrit 40,000 units every 2 weeks for a hemoglobin less than 10.    Today her hemoglobin is 9.8.  She will proceed with Retacrit today.  She is tolerating this well.    We will see [name] back in 2 weeks for labs and an injection and in 4 weeks for labs, f/u and injection.

## 2023-04-02 DIAGNOSIS — I5032 Chronic diastolic (congestive) heart failure: Secondary | ICD-10-CM | POA: Diagnosis not present

## 2023-04-02 DIAGNOSIS — Z515 Encounter for palliative care: Secondary | ICD-10-CM | POA: Diagnosis not present

## 2023-04-09 DIAGNOSIS — C44311 Basal cell carcinoma of skin of nose: Secondary | ICD-10-CM | POA: Diagnosis not present

## 2023-04-09 DIAGNOSIS — C44319 Basal cell carcinoma of skin of other parts of face: Secondary | ICD-10-CM | POA: Diagnosis not present

## 2023-04-09 DIAGNOSIS — H6122 Impacted cerumen, left ear: Secondary | ICD-10-CM | POA: Diagnosis not present

## 2023-04-13 ENCOUNTER — Other Ambulatory Visit: Payer: Self-pay

## 2023-04-13 ENCOUNTER — Inpatient Hospital Stay: Payer: Medicare HMO | Attending: Oncology

## 2023-04-13 ENCOUNTER — Inpatient Hospital Stay: Payer: Medicare HMO

## 2023-04-13 VITALS — BP 124/59 | HR 59 | Temp 98.1°F | Resp 16

## 2023-04-13 DIAGNOSIS — N184 Chronic kidney disease, stage 4 (severe): Secondary | ICD-10-CM | POA: Insufficient documentation

## 2023-04-13 DIAGNOSIS — D591 Autoimmune hemolytic anemia, unspecified: Secondary | ICD-10-CM | POA: Insufficient documentation

## 2023-04-13 DIAGNOSIS — I7782 Antineutrophilic cytoplasmic antibody (ANCA) vasculitis: Secondary | ICD-10-CM | POA: Insufficient documentation

## 2023-04-13 DIAGNOSIS — Z952 Presence of prosthetic heart valve: Secondary | ICD-10-CM

## 2023-04-13 DIAGNOSIS — D638 Anemia in other chronic diseases classified elsewhere: Secondary | ICD-10-CM

## 2023-04-13 DIAGNOSIS — Z7901 Long term (current) use of anticoagulants: Secondary | ICD-10-CM | POA: Diagnosis not present

## 2023-04-13 DIAGNOSIS — D500 Iron deficiency anemia secondary to blood loss (chronic): Secondary | ICD-10-CM

## 2023-04-13 DIAGNOSIS — M8580 Other specified disorders of bone density and structure, unspecified site: Secondary | ICD-10-CM | POA: Diagnosis not present

## 2023-04-13 DIAGNOSIS — D631 Anemia in chronic kidney disease: Secondary | ICD-10-CM | POA: Insufficient documentation

## 2023-04-13 DIAGNOSIS — E1122 Type 2 diabetes mellitus with diabetic chronic kidney disease: Secondary | ICD-10-CM | POA: Insufficient documentation

## 2023-04-13 DIAGNOSIS — Z5181 Encounter for therapeutic drug level monitoring: Secondary | ICD-10-CM

## 2023-04-13 LAB — CBC WITH DIFFERENTIAL/PLATELET
Abs Immature Granulocytes: 0.02 10*3/uL (ref 0.00–0.07)
Basophils Absolute: 0 10*3/uL (ref 0.0–0.1)
Basophils Relative: 0 %
Eosinophils Absolute: 0.3 10*3/uL (ref 0.0–0.5)
Eosinophils Relative: 5 %
HCT: 25.2 % — ABNORMAL LOW (ref 36.0–46.0)
Hemoglobin: 7.9 g/dL — ABNORMAL LOW (ref 12.0–15.0)
Immature Granulocytes: 0 %
Lymphocytes Relative: 20 %
Lymphs Abs: 1.4 10*3/uL (ref 0.7–4.0)
MCH: 29 pg (ref 26.0–34.0)
MCHC: 31.3 g/dL (ref 30.0–36.0)
MCV: 92.6 fL (ref 80.0–100.0)
Monocytes Absolute: 0.7 10*3/uL (ref 0.1–1.0)
Monocytes Relative: 9 %
Neutro Abs: 4.8 10*3/uL (ref 1.7–7.7)
Neutrophils Relative %: 66 %
Platelets: 126 10*3/uL — ABNORMAL LOW (ref 150–400)
RBC: 2.72 MIL/uL — ABNORMAL LOW (ref 3.87–5.11)
RDW: 18.3 % — ABNORMAL HIGH (ref 11.5–15.5)
WBC: 7.3 10*3/uL (ref 4.0–10.5)
nRBC: 0 % (ref 0.0–0.2)

## 2023-04-13 LAB — COMPREHENSIVE METABOLIC PANEL
ALT: 15 U/L (ref 0–44)
AST: 30 U/L (ref 15–41)
Albumin: 3.4 g/dL — ABNORMAL LOW (ref 3.5–5.0)
Alkaline Phosphatase: 111 U/L (ref 38–126)
Anion gap: 10 (ref 5–15)
BUN: 112 mg/dL — ABNORMAL HIGH (ref 8–23)
CO2: 23 mmol/L (ref 22–32)
Calcium: 9.3 mg/dL (ref 8.9–10.3)
Chloride: 99 mmol/L (ref 98–111)
Creatinine, Ser: 3.01 mg/dL — ABNORMAL HIGH (ref 0.44–1.00)
GFR, Estimated: 15 mL/min — ABNORMAL LOW (ref >60)
Glucose, Bld: 163 mg/dL — ABNORMAL HIGH (ref 70–99)
Potassium: 4.5 mmol/L (ref 3.5–5.1)
Sodium: 132 mmol/L — ABNORMAL LOW (ref 135–145)
Total Bilirubin: 0.5 mg/dL (ref 0.3–1.2)
Total Protein: 5.3 g/dL — ABNORMAL LOW (ref 6.5–8.1)

## 2023-04-13 MED ORDER — EPOETIN ALFA-EPBX 40000 UNIT/ML IJ SOLN
40000.0000 [IU] | Freq: Once | INTRAMUSCULAR | Status: AC
  Administered 2023-04-13: 40000 [IU] via SUBCUTANEOUS
  Filled 2023-04-13: qty 1

## 2023-04-16 ENCOUNTER — Ambulatory Visit: Payer: Medicare HMO | Admitting: *Deleted

## 2023-04-16 DIAGNOSIS — Z5181 Encounter for therapeutic drug level monitoring: Secondary | ICD-10-CM | POA: Diagnosis not present

## 2023-04-16 DIAGNOSIS — Z952 Presence of prosthetic heart valve: Secondary | ICD-10-CM

## 2023-04-16 LAB — POCT INR: INR: 7 — AB (ref 2.0–3.0)

## 2023-04-16 NOTE — Patient Instructions (Addendum)
Hold warfarin and recheck INR on Thursday Recheck INR on Thursday.  Bleeding and fall precautions discussed with pt and daughter Denies any S/S of bleeding

## 2023-04-17 ENCOUNTER — Telehealth: Payer: Self-pay | Admitting: *Deleted

## 2023-04-17 ENCOUNTER — Other Ambulatory Visit: Payer: Self-pay | Admitting: *Deleted

## 2023-04-17 ENCOUNTER — Telehealth: Payer: Self-pay | Admitting: Hematology and Oncology

## 2023-04-17 DIAGNOSIS — D500 Iron deficiency anemia secondary to blood loss (chronic): Secondary | ICD-10-CM

## 2023-04-17 DIAGNOSIS — Z7901 Long term (current) use of anticoagulants: Secondary | ICD-10-CM

## 2023-04-17 DIAGNOSIS — Z952 Presence of prosthetic heart valve: Secondary | ICD-10-CM

## 2023-04-17 DIAGNOSIS — Z5181 Encounter for therapeutic drug level monitoring: Secondary | ICD-10-CM

## 2023-04-17 NOTE — Telephone Encounter (Signed)
Patient's daughter called, left VM: Patient had labs on 04/13/23 and was told she'd need a unit of blood in a few days. At time of VM, no one had called them. She is concerned as mother feels weak and says her heart rate did not feel right over the weekend. Daughter also states her mother declined to go to ED or Urgent Care over weekend.   Contacted Ms. Shoffner. She said she had just hung up with [facility] Scheduling and patient has appt on 7/11- lab at 1:30 pm and blood transfusion at 2:30 pm. Offered earlier transfusion time of 7:30 am on 7/10 with labs on 7/9 due to reported symptoms. Ms. Genelle Gather asked her mother if she wanted to come for bold on 7/10 and said her mother told her "I don't want to get up that early, I would rather come on Thursday at 2:30". Advised Ms. Shoffner that if mother has increased weakness and/or short of breath or if she reports continued changes in how her heart rate rate feels, they should seek emergency care for her. Ms. Genelle Gather verbalized understanding of this information.  Contacted Vashti Hey, RN w/[facility] at daughter's request. Patient has lab appt 7/11 there at 2:45 pm to check her PT/INR. Daughter asked if blood test could be done at [facility] with other tests with results sent to her MD there. Ms. Azucena Kuba stated that would be ok and asked if PT/INR results could be sent to her and Dr. Simona Huh. Results will be CC'd.

## 2023-04-17 NOTE — Telephone Encounter (Signed)
Spoke with patient daughter confirming upcoming appointment 

## 2023-04-19 ENCOUNTER — Other Ambulatory Visit: Payer: Self-pay

## 2023-04-19 ENCOUNTER — Other Ambulatory Visit: Payer: Self-pay | Admitting: *Deleted

## 2023-04-19 ENCOUNTER — Inpatient Hospital Stay: Payer: Medicare HMO

## 2023-04-19 ENCOUNTER — Ambulatory Visit (INDEPENDENT_AMBULATORY_CARE_PROVIDER_SITE_OTHER): Payer: Self-pay | Admitting: *Deleted

## 2023-04-19 DIAGNOSIS — E1122 Type 2 diabetes mellitus with diabetic chronic kidney disease: Secondary | ICD-10-CM | POA: Diagnosis not present

## 2023-04-19 DIAGNOSIS — M8580 Other specified disorders of bone density and structure, unspecified site: Secondary | ICD-10-CM | POA: Diagnosis not present

## 2023-04-19 DIAGNOSIS — I7782 Antineutrophilic cytoplasmic antibody (ANCA) vasculitis: Secondary | ICD-10-CM | POA: Diagnosis not present

## 2023-04-19 DIAGNOSIS — D591 Autoimmune hemolytic anemia, unspecified: Secondary | ICD-10-CM

## 2023-04-19 DIAGNOSIS — D649 Anemia, unspecified: Secondary | ICD-10-CM

## 2023-04-19 DIAGNOSIS — Z5181 Encounter for therapeutic drug level monitoring: Secondary | ICD-10-CM

## 2023-04-19 DIAGNOSIS — Z952 Presence of prosthetic heart valve: Secondary | ICD-10-CM

## 2023-04-19 DIAGNOSIS — D500 Iron deficiency anemia secondary to blood loss (chronic): Secondary | ICD-10-CM

## 2023-04-19 DIAGNOSIS — D631 Anemia in chronic kidney disease: Secondary | ICD-10-CM | POA: Diagnosis not present

## 2023-04-19 DIAGNOSIS — N184 Chronic kidney disease, stage 4 (severe): Secondary | ICD-10-CM | POA: Diagnosis not present

## 2023-04-19 DIAGNOSIS — Z7901 Long term (current) use of anticoagulants: Secondary | ICD-10-CM | POA: Diagnosis not present

## 2023-04-19 LAB — PROTIME-INR
INR: 1.4 — ABNORMAL HIGH (ref 0.8–1.2)
Prothrombin Time: 17.2 seconds — ABNORMAL HIGH (ref 11.4–15.2)

## 2023-04-19 LAB — CBC WITH DIFFERENTIAL (CANCER CENTER ONLY)
Abs Immature Granulocytes: 0.02 10*3/uL (ref 0.00–0.07)
Basophils Absolute: 0 10*3/uL (ref 0.0–0.1)
Basophils Relative: 1 %
Eosinophils Absolute: 0.4 10*3/uL (ref 0.0–0.5)
Eosinophils Relative: 5 %
HCT: 24.4 % — ABNORMAL LOW (ref 36.0–46.0)
Hemoglobin: 7.6 g/dL — ABNORMAL LOW (ref 12.0–15.0)
Immature Granulocytes: 0 %
Lymphocytes Relative: 17 %
Lymphs Abs: 1.3 10*3/uL (ref 0.7–4.0)
MCH: 30 pg (ref 26.0–34.0)
MCHC: 31.1 g/dL (ref 30.0–36.0)
MCV: 96.4 fL (ref 80.0–100.0)
Monocytes Absolute: 0.5 10*3/uL (ref 0.1–1.0)
Monocytes Relative: 7 %
Neutro Abs: 5.1 10*3/uL (ref 1.7–7.7)
Neutrophils Relative %: 70 %
Platelet Count: 136 10*3/uL — ABNORMAL LOW (ref 150–400)
RBC: 2.53 MIL/uL — ABNORMAL LOW (ref 3.87–5.11)
RDW: 20 % — ABNORMAL HIGH (ref 11.5–15.5)
WBC Count: 7.4 10*3/uL (ref 4.0–10.5)
nRBC: 0 % (ref 0.0–0.2)

## 2023-04-19 LAB — COMPREHENSIVE METABOLIC PANEL
ALT: 10 U/L (ref 0–44)
AST: 24 U/L (ref 15–41)
Albumin: 3.5 g/dL (ref 3.5–5.0)
Alkaline Phosphatase: 96 U/L (ref 38–126)
Anion gap: 12 (ref 5–15)
BUN: 108 mg/dL — ABNORMAL HIGH (ref 8–23)
CO2: 20 mmol/L — ABNORMAL LOW (ref 22–32)
Calcium: 8.9 mg/dL (ref 8.9–10.3)
Chloride: 100 mmol/L (ref 98–111)
Creatinine, Ser: 3.46 mg/dL — ABNORMAL HIGH (ref 0.44–1.00)
GFR, Estimated: 13 mL/min — ABNORMAL LOW (ref >60)
Glucose, Bld: 202 mg/dL — ABNORMAL HIGH (ref 70–99)
Potassium: 4.2 mmol/L (ref 3.5–5.1)
Sodium: 132 mmol/L — ABNORMAL LOW (ref 135–145)
Total Bilirubin: 0.5 mg/dL (ref 0.3–1.2)
Total Protein: 5.8 g/dL — ABNORMAL LOW (ref 6.5–8.1)

## 2023-04-19 LAB — SAMPLE TO BLOOD BANK

## 2023-04-19 LAB — PREPARE RBC (CROSSMATCH)

## 2023-04-19 MED ORDER — DIPHENHYDRAMINE HCL 25 MG PO CAPS
25.0000 mg | ORAL_CAPSULE | Freq: Once | ORAL | Status: AC
  Administered 2023-04-19: 25 mg via ORAL
  Filled 2023-04-19: qty 1

## 2023-04-19 MED ORDER — SODIUM CHLORIDE 0.9% FLUSH: 10.0000 mL | INTRAVENOUS | Status: DC | PRN

## 2023-04-19 MED ORDER — ACETAMINOPHEN 325 MG PO TABS
650.0000 mg | ORAL_TABLET | Freq: Once | ORAL | Status: AC
  Administered 2023-04-19: 650 mg via ORAL
  Filled 2023-04-19: qty 2

## 2023-04-19 NOTE — Patient Instructions (Signed)
Take warfarin 2 tablets tonight then decrease dose to 1 tablet daily except 1 1/2 tablets on Mondays, Wednesdays and Fridays since INR last week was 7.0 Recheck INR on 04/30/23.

## 2023-04-19 NOTE — Patient Instructions (Signed)
Blood Transfusion, Adult, Care After The following information offers guidance on how to care for yourself after your procedure. Your health care provider may also give you more specific instructions. If you have problems or questions, contact your health care provider. What can I expect after the procedure? After the procedure, it is common to have: Bruising and soreness where the IV was inserted. A headache. Follow these instructions at home: IV insertion site care     Follow instructions from your health care provider about how to take care of your IV insertion site. Make sure you: Wash your hands with soap and water for at least 20 seconds before and after you change your bandage (dressing). If soap and water are not available, use hand sanitizer. Change your dressing as told by your health care provider. Check your IV insertion site every day for signs of infection. Check for: Redness, swelling, or pain. Bleeding from the site. Warmth. Pus or a bad smell. General instructions Take over-the-counter and prescription medicines only as told by your health care provider. Rest as told by your health care provider. Return to your normal activities as told by your health care provider. Keep all follow-up visits. Lab tests may need to be done at certain periods to recheck your blood counts. Contact a health care provider if: You have itching or red, swollen areas of skin (hives). You have a fever or chills. You have pain in the head, back, or chest. You feel anxious or you feel weak after doing your normal activities. You have redness, swelling, warmth, or pain around the IV insertion site. You have blood coming from the IV insertion site that does not stop with pressure. You have pus or a bad smell coming from your IV insertion site. If you received your blood transfusion in an outpatient setting, you will be told whom to contact to report any reactions. Get help right away if: You  have symptoms of a serious allergic or immune system reaction, including: Trouble breathing or shortness of breath. Swelling of the face, feeling flushed, or widespread rash. Dark urine or blood in the urine. Fast heartbeat. These symptoms may be an emergency. Get help right away. Call 911. Do not wait to see if the symptoms will go away. Do not drive yourself to the hospital. Summary Bruising and soreness around the IV insertion site are common. Check your IV insertion site every day for signs of infection. Rest as told by your health care provider. Return to your normal activities as told by your health care provider. Get help right away for symptoms of a serious allergic or immune system reaction to the blood transfusion. This information is not intended to replace advice given to you by your health care provider. Make sure you discuss any questions you have with your health care provider. Document Revised: 12/23/2021 Document Reviewed: 12/23/2021 Elsevier Patient Education  2024 [employer]  

## 2023-04-20 LAB — BPAM RBC
Blood Product Expiration Date: 202408072359
ISSUE DATE / TIME: 202407111452
Unit Type and Rh: 6200

## 2023-04-20 LAB — TYPE AND SCREEN
ABO/RH(D): A POS
Antibody Screen: NEGATIVE
Unit division: 0

## 2023-04-24 DIAGNOSIS — I130 Hypertensive heart and chronic kidney disease with heart failure and stage 1 through stage 4 chronic kidney disease, or unspecified chronic kidney disease: Secondary | ICD-10-CM | POA: Diagnosis not present

## 2023-04-24 DIAGNOSIS — I5032 Chronic diastolic (congestive) heart failure: Secondary | ICD-10-CM | POA: Diagnosis not present

## 2023-04-24 DIAGNOSIS — I7782 Antineutrophilic cytoplasmic antibody (ANCA) vasculitis: Secondary | ICD-10-CM | POA: Diagnosis not present

## 2023-04-24 DIAGNOSIS — E1122 Type 2 diabetes mellitus with diabetic chronic kidney disease: Secondary | ICD-10-CM | POA: Diagnosis not present

## 2023-04-24 DIAGNOSIS — D631 Anemia in chronic kidney disease: Secondary | ICD-10-CM | POA: Diagnosis not present

## 2023-04-24 DIAGNOSIS — M109 Gout, unspecified: Secondary | ICD-10-CM | POA: Diagnosis not present

## 2023-04-24 DIAGNOSIS — N184 Chronic kidney disease, stage 4 (severe): Secondary | ICD-10-CM | POA: Diagnosis not present

## 2023-04-26 ENCOUNTER — Other Ambulatory Visit: Payer: Self-pay

## 2023-04-26 ENCOUNTER — Encounter: Payer: Self-pay | Admitting: Hematology and Oncology

## 2023-04-26 ENCOUNTER — Inpatient Hospital Stay: Payer: Medicare HMO

## 2023-04-26 ENCOUNTER — Inpatient Hospital Stay: Payer: Medicare HMO | Admitting: Hematology and Oncology

## 2023-04-26 VITALS — BP 117/68 | HR 55 | Temp 97.5°F | Resp 16 | Wt 112.7 lb

## 2023-04-26 DIAGNOSIS — I7782 Antineutrophilic cytoplasmic antibody (ANCA) vasculitis: Secondary | ICD-10-CM | POA: Diagnosis not present

## 2023-04-26 DIAGNOSIS — Z5181 Encounter for therapeutic drug level monitoring: Secondary | ICD-10-CM | POA: Diagnosis not present

## 2023-04-26 DIAGNOSIS — N184 Chronic kidney disease, stage 4 (severe): Secondary | ICD-10-CM | POA: Diagnosis not present

## 2023-04-26 DIAGNOSIS — R600 Localized edema: Secondary | ICD-10-CM

## 2023-04-26 DIAGNOSIS — E1122 Type 2 diabetes mellitus with diabetic chronic kidney disease: Secondary | ICD-10-CM | POA: Diagnosis not present

## 2023-04-26 DIAGNOSIS — Z7901 Long term (current) use of anticoagulants: Secondary | ICD-10-CM | POA: Diagnosis not present

## 2023-04-26 DIAGNOSIS — D591 Autoimmune hemolytic anemia, unspecified: Secondary | ICD-10-CM | POA: Diagnosis not present

## 2023-04-26 DIAGNOSIS — D649 Anemia, unspecified: Secondary | ICD-10-CM | POA: Diagnosis not present

## 2023-04-26 DIAGNOSIS — D631 Anemia in chronic kidney disease: Secondary | ICD-10-CM | POA: Diagnosis not present

## 2023-04-26 DIAGNOSIS — D500 Iron deficiency anemia secondary to blood loss (chronic): Secondary | ICD-10-CM

## 2023-04-26 DIAGNOSIS — M8580 Other specified disorders of bone density and structure, unspecified site: Secondary | ICD-10-CM | POA: Diagnosis not present

## 2023-04-26 DIAGNOSIS — D638 Anemia in other chronic diseases classified elsewhere: Secondary | ICD-10-CM

## 2023-04-26 LAB — CBC WITH DIFFERENTIAL/PLATELET
Abs Immature Granulocytes: 0.02 10*3/uL (ref 0.00–0.07)
Basophils Absolute: 0.1 10*3/uL (ref 0.0–0.1)
Basophils Relative: 1 %
Eosinophils Absolute: 0.4 10*3/uL (ref 0.0–0.5)
Eosinophils Relative: 6 %
HCT: 25.8 % — ABNORMAL LOW (ref 36.0–46.0)
Hemoglobin: 8.4 g/dL — ABNORMAL LOW (ref 12.0–15.0)
Immature Granulocytes: 0 %
Lymphocytes Relative: 19 %
Lymphs Abs: 1.3 10*3/uL (ref 0.7–4.0)
MCH: 30.4 pg (ref 26.0–34.0)
MCHC: 32.6 g/dL (ref 30.0–36.0)
MCV: 93.5 fL (ref 80.0–100.0)
Monocytes Absolute: 0.5 10*3/uL (ref 0.1–1.0)
Monocytes Relative: 8 %
Neutro Abs: 4.6 10*3/uL (ref 1.7–7.7)
Neutrophils Relative %: 66 %
Platelets: 91 10*3/uL — ABNORMAL LOW (ref 150–400)
RBC: 2.76 MIL/uL — ABNORMAL LOW (ref 3.87–5.11)
RDW: 19.2 % — ABNORMAL HIGH (ref 11.5–15.5)
WBC: 7 10*3/uL (ref 4.0–10.5)
nRBC: 0 % (ref 0.0–0.2)

## 2023-04-26 LAB — COMPREHENSIVE METABOLIC PANEL
ALT: 15 U/L (ref 0–44)
AST: 30 U/L (ref 15–41)
Albumin: 3.6 g/dL (ref 3.5–5.0)
Alkaline Phosphatase: 97 U/L (ref 38–126)
Anion gap: 10 (ref 5–15)
BUN: 114 mg/dL — ABNORMAL HIGH (ref 8–23)
CO2: 21 mmol/L — ABNORMAL LOW (ref 22–32)
Calcium: 8.8 mg/dL — ABNORMAL LOW (ref 8.9–10.3)
Chloride: 103 mmol/L (ref 98–111)
Creatinine, Ser: 2.84 mg/dL — ABNORMAL HIGH (ref 0.44–1.00)
GFR, Estimated: 16 mL/min — ABNORMAL LOW (ref >60)
Glucose, Bld: 155 mg/dL — ABNORMAL HIGH (ref 70–99)
Potassium: 4.4 mmol/L (ref 3.5–5.1)
Sodium: 134 mmol/L — ABNORMAL LOW (ref 135–145)
Total Bilirubin: 0.5 mg/dL (ref 0.3–1.2)
Total Protein: 5.7 g/dL — ABNORMAL LOW (ref 6.5–8.1)

## 2023-04-26 MED ORDER — EPOETIN ALFA-EPBX 40000 UNIT/ML IJ SOLN
40000.0000 [IU] | Freq: Once | INTRAMUSCULAR | Status: AC
  Administered 2023-04-26: 40000 [IU] via SUBCUTANEOUS
  Filled 2023-04-26: qty 1

## 2023-04-26 NOTE — Progress Notes (Signed)
[facility] Cancer Follow up:    [name], [name], MD [address] [place] [address]   DIAGNOSIS:   SUMMARY OF HEMATOLOGIC HISTORY: [name] is a 82 y.o. [place] woman with multifactorial anemia, as follows:             (a) anemia of chronic inflammation: As of May 2020 she has a SED rate of 95, CRP 1.1, positive ANA and RF; subsequently she was found to be ANCA positive             (b) hemolysis (mild): she has a mildly elevated LDH and a DAT positive for complement, not IgG; this is c/w (a) above             (c) anemia of renal insufficiency: inadequate EPO resonse to anemia                         (d) GIB/ iron deficiency in patient on lifelong anticoagulaton   (1) Feraheme received 03/01/2019, repeated 03/20/2019              (a) reticulocyte 03/07/2019 up from 43.4 a year ago to 191.8 after iron infusion             (b) feraheme repeated on 05/12/2019             (c) subsequent ferritin levels >100   (2) darbepoetin: starting 05/05/2019 at given every 2 weeks             (a) dose increased to 200 mcg every week starting 07/01/2019              (b) back to every 2 weeks beginning 08/04/2019 still at 200 mcg dose             (c) switched to Retacrit every 4 weeks as of 08/18/2019   (3) ANCA positive vasculitis: diagnosed 05/08/2019 (titer 1:640)             (a) prednisone 40 mg daily started 05/31/2019             (b) rituximab weekly started 06/12/2019 (received test dose 06/05/2019)                         (i) hepatitis B sAg, core Ab and HIV negative 05/08/2019                         (ii) rituximab held after 07/14/2019 dose (received 5 weekly doses) with neutropenia                         (iii) rituximab resumed 08/18/2019 but changed to monthly                         (iv) rituximab changed to every 2 months beginning with 02/02/2020 dose                         (v) rituximab changed to every 12 weeks after the 10/12/2020 dose              (c) prednisone tapered to off as of 09/15/2019                          (4) Anemia worsened in spring 2023, refractory  to Rituximab and requiring prednisone restart with slow taper due currently at 20mg  which she will stay at.   (5) osteopenia with a T score of -1.5 on bone density 02/28/2016 at the [facility]  CURRENT THERAPY: Retacrit  INTERVAL HISTORY:  [name] 82 y.o. female returns for f/u of her anemia.   Since her last visit, she received PRBC, she denies any recent hospitalizations.  She was taking some doxycycline for possible cellulitis in her lower extremities.  She was told that her chronic kidney disease is overall getting worse when she went to see her nephrologist recently.  She denies any bleeding issues.  She has not required packed red blood cells in several months.  She continues with EPO every 2 weeks.  Rest of the pertinent 10 point ROS reviewed and negative   Patient Active Problem List   Diagnosis Date Noted   Chronic cough 03/26/2023   Protein-calorie malnutrition, severe 12/26/2022   Acute GI bleeding 12/24/2022   Coagulopathy (HCC) 12/24/2022   Melena 12/24/2022   Duodenitis 12/24/2022   Noninfectious gastroenteritis 12/24/2022   CAD, multiple vessel 12/24/2022   Acute on chronic heart failure with preserved ejection fraction (HFpEF) (HCC) 08/29/2022   Acute exacerbation of CHF (congestive heart failure) (HCC) 08/29/2022   Acute urinary retention 08/28/2022   Acute kidney injury superimposed on chronic kidney disease (HCC) 08/27/2022   Constipation 08/27/2022   Nausea and vomiting 08/27/2022   Hypoalbuminemia due to protein-calorie malnutrition (HCC) 08/27/2022   Type 2 diabetes mellitus with hyperglycemia (HCC) 08/27/2022   Palliative care encounter    Goals of care, counseling/discussion    Encounter for therapeutic drug monitoring    GI bleed 07/09/2022   GI bleeding 07/08/2022   Acute on chronic anemia 07/08/2022   History of  Clostridioides difficile colitis 07/08/2022   DNR (do not resuscitate)/DNI(Do Not Intubate) 07/08/2022   Anemia of chronic disease 07/07/2022   Pressure injury of skin 06/23/2022   UTI (urinary tract infection) 06/22/2022   Joint pain 06/09/2022   Parietoalveolar pneumopathy (HCC) 06/09/2022   Primary osteoarthritis 06/09/2022   Rheumatoid factor positive 06/09/2022   C. difficile colitis 04/19/2022   Colitis 04/18/2022   PAH (pulmonary artery hypertension) (HCC) 04/18/2022   Emphysematous cystitis 04/13/2022   Female bladder prolapse 04/13/2022   Diverticulitis 04/12/2022   Autoimmune hemolytic anemia (HCC) 01/02/2022   Bilateral lower extremity edema 01/02/2022   Refractory anemia (HCC) 12/15/2021   Abnormal chest x-ray 12/15/2021   Supratherapeutic INR 12/15/2021   Elevated troponin 12/15/2021   Unilateral primary osteoarthritis, right hip 05/31/2021   Midline cystocele 05/30/2021   Primary localized osteoarthritis of pelvic region and thigh 05/30/2021   Vaginal pain 05/30/2021   Asymmetric SNHL (sensorineural hearing loss) 01/06/2021   Referred otalgia of both ears 01/06/2021   Bilateral hearing loss 08/31/2020   Low back pain 02/20/2020   CKD (chronic kidney disease), stage IV (HCC) 10/13/2019   Leukopenia due to antineoplastic chemotherapy (HCC) 08/18/2019   Thrombophilia (HCC) 06/19/2019   Encounter for general adult medical examination without abnormal findings 06/13/2019   Arteritis (HCC) 06/06/2019   Hypertensive heart and chronic kidney disease with heart failure and stage 1 through stage 4 chronic kidney disease, or unspecified chronic kidney disease (HCC) 06/06/2019   Muscle weakness 06/06/2019   Pancytopenia (HCC) 05/28/2019   Dyspnea 05/28/2019   Vasculitis, ANCA positive 05/28/2019   Disorder of connective tissue (HCC) 05/14/2019   Renal insufficiency 05/08/2019   PAF (paroxysmal atrial fibrillation) (HCC) 04/07/2019  Iron deficiency anemia 02/28/2019    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 (HCC) 02/28/2019   Diabetic retinopathy associated with type 2 diabetes mellitus (HCC) 09/02/2018   Chronic pain 06/19/2018   Non-thrombocytopenic purpura (HCC) 05/21/2018   Gout 03/22/2018   Malnutrition of moderate degree 03/12/2018   Diabetes mellitus type 2, controlled (HCC) 03/06/2018   Chronic atrial fibrillation (HCC) 03/06/2018   H/O mechanical aortic valve replacement 03/06/2018   Moderate to severe pulmonary hypertension (HCC) 03/06/2018   GERD without esophagitis 02/21/2018   Anxiety 02/21/2018   Chronic diastolic CHF (congestive heart failure) (HCC) 02/21/2018   General weakness 02/02/2018   Thrombocytopenia (HCC) 01/25/2018   Arm pain, diffuse 01/25/2018   Anemia 01/23/2018   Aortic atherosclerosis (HCC) 01/23/2018   Age-related osteoporosis without current pathological fracture 05/24/2017   Carotid artery occlusion 05/24/2017   Generalized anxiety disorder 05/24/2017   Hearing loss 05/24/2017   Presence of prosthetic heart valve 05/24/2017   Carotid artery disease (HCC) 08/18/2013   Peripheral arterial disease (HCC) 08/18/2013   Diabetes mellitus without complication (HCC) 08/18/2013   Essential hypertension    Hypercholesteremia    Mechanical heart valve present    Chronic anticoagulation     is allergic to augmentin [amoxicillin-pot clavulanate], ciprofloxacin, codeine, coreg [carvedilol], diflucan [fluconazole], levofloxacin in d5w, losartan, privigen [immune globulin (human)], sulfamethoxazole-trimethoprim, verapamil, zetia [ezetimibe], zocor [simvastatin - high dose], and gatifloxacin.  MEDICAL HISTORY: Past Medical History:  Diagnosis Date   Aortic atherosclerosis (HCC) 01/23/2018   Atrial fibrillation (HCC)    Benign positional vertigo 10/06/2011   Carotid artery disease (HCC)    Chemotherapy induced neutropenia (HCC) 07/21/2019   Cholelithiasis 01/23/2018   Cholelithiasis 01/23/2018    Chronic anticoagulation    Colovesical fistula, ruled out 04/14/2022   Coronary artery disease    status post coronary artery bypass grafting times 07/10/2004   Diabetes mellitus    Hypercholesteremia    Hypertension    Mechanical heart valve present    H. aortic valve replacement at the time of bypass surgery October 2005   Moderate to severe pulmonary hypertension (HCC)    Peripheral arterial disease (HCC)    history of left common iliac artery PTA and stenting for a chronic total occlusion 08/26/01   S/P cholecystectomy 03/06/2018    SURGICAL HISTORY: Past Surgical History:  Procedure Laterality Date   anterior repair  2009   AORTIC VALVE REPLACEMENT  2005   Dr. Laneta Simmers   CARDIAC CATHETERIZATION  11/10/2004   40% right common illiac, 70% in stent restenosis of distal left common illiac,    CARDIAC CATHETERIZATION  05/18/2004   LAD 50-70% midstenosis, RCA dominant w/50% stenosis, 50% Right common Illiac artery ostial stenosis, 90% in stent restenosis within midportion of left common illiac stent   Carotid Duplex  03/12/2012   RSA-elev. velocities suggestive of a 50-69% diameter reduction, Right&Left Bulb/Prox ICA-mild-mod.fibrous plaqueelevating Velocities abnormal study.   CHOLECYSTECTOMY N/A 03/01/2018   Procedure: LAPAROSCOPIC CHOLECYSTECTOMY;  Surgeon: Jimmye Norman, MD;  Location: [facility] OR;  Service: General;  Laterality: N/A;   COLONOSCOPY WITH PROPOFOL N/A 01/22/2018   Procedure: COLONOSCOPY WITH PROPOFOL;  Surgeon: Charlott Rakes, MD;  Location: [facility] ENDOSCOPY;  Service: Endoscopy;  Laterality: N/A;   ESOPHAGOGASTRODUODENOSCOPY N/A 07/13/2022   Procedure: ESOPHAGOGASTRODUODENOSCOPY (EGD);  Surgeon: Kerin Salen, MD;  Location: [facility] ENDOSCOPY;  Service: Gastroenterology;  Laterality: N/A;   ESOPHAGOGASTRODUODENOSCOPY (EGD) WITH PROPOFOL N/A 01/22/2018   Procedure: ESOPHAGOGASTRODUODENOSCOPY (EGD) WITH PROPOFOL;  Surgeon: Charlott Rakes, MD;  Location: [facility] ENDOSCOPY;  Service:  Endoscopy;  Laterality: N/A;   ESOPHAGOGASTRODUODENOSCOPY (EGD) WITH PROPOFOL N/A 11/21/2018   Procedure: ESOPHAGOGASTRODUODENOSCOPY (EGD) WITH PROPOFOL;  Surgeon: Kerin Salen, MD;  Location: [facility] ENDOSCOPY;  Service: Gastroenterology;  Laterality: N/A;   ESOPHAGOGASTRODUODENOSCOPY (EGD) WITH PROPOFOL Left 02/27/2019   Procedure: ESOPHAGOGASTRODUODENOSCOPY (EGD) WITH PROPOFOL;  Surgeon: Willis Modena, MD;  Location: [facility] ENDOSCOPY;  Service: Endoscopy;  Laterality: Left;   GIVENS CAPSULE STUDY N/A 11/21/2018   Procedure: GIVENS CAPSULE STUDY;  Surgeon: Kerin Salen, MD;  Location: [facility] ENDOSCOPY;  Service: Gastroenterology;  Laterality: N/A;  To be deployed during EGD   Lower Ext. Duplex  03/12/2012   Right Proximal CIA- vessel narrowing w/elevated velocities 0-49% diameter reduction. Right SFA-mild mixed density plaque throughout vessel.   NM MYOCAR PERF WALL MOTION  05/19/2010   protocol: Persantine, post stress EF 65%, negative for ischemia, low risk scan   RIGHT HEART CATH N/A 06/27/2018   Procedure: RIGHT HEART CATH;  Surgeon: Laurey Morale, MD;  Location: [facility] INVASIVE CV LAB;  Service: Cardiovascular;  Laterality: N/A;   TOTAL ABDOMINAL HYSTERECTOMY W/ BILATERAL SALPINGOOPHORECTOMY  1989   TRANSTHORACIC ECHOCARDIOGRAM  08/29/2012   Moderately calcified annulus of mitral valve, moderate regurg. of both mitral valve and tricuspid valve.     SOCIAL HISTORY: Social History   Socioeconomic History   Marital status: Widowed    Spouse name: Not on file   Number of children: Not on file   Years of education: Not on file   Highest education level: Not on file  Occupational History   Occupation: Retired  Tobacco Use   Smoking status: Former    Current packs/day: 1.00    Average packs/day: 1 pack/day for 30.0 years (30.0 ttl pk-yrs)    Types: Cigarettes   Smokeless tobacco: Never   Tobacco comments:    quit smoking 2005  Vaping Use   Vaping status: Never Used  Substance and Sexual Activity    Alcohol use: No    Alcohol/week: 0.0 standard drinks of alcohol   Drug use: No   Sexual activity: Not Currently    Birth control/protection: None  Other Topics Concern   Not on file  Social History Narrative   Not on file   Social Determinants of Health   Financial Resource Strain: Low Risk  (02/28/2022)   Overall Financial Resource Strain (CARDIA)    Difficulty of Paying Living Expenses: Not hard at all  Food Insecurity: No Food Insecurity (12/25/2022)   Hunger Vital Sign    Worried About Running Out of Food in the Last Year: Never true    Ran Out of Food in the Last Year: Never true  Transportation Needs: No Transportation Needs (12/25/2022)   PRAPARE - Administrator, Civil Service (Medical): No    Lack of Transportation (Non-Medical): No  Physical Activity: Inactive (02/28/2022)   Exercise Vital Sign    Days of Exercise per Week: 0 days    Minutes of Exercise per Session: 0 min  Stress: No Stress Concern Present (02/28/2022)   [employer] of Occupational Health - Occupational Stress Questionnaire    Feeling of Stress : Only a little  Social Connections: Moderately Integrated (06/22/2022)   Social Connection and Isolation Panel [NHANES]    Frequency of Communication with Friends and Family: More than three times a week    Frequency of Social Gatherings with Friends and Family: More than three times a week    Attends Religious Services: More than 4 times per year  Active Member of Clubs or Organizations: Yes    Attends Banker Meetings: More than 4 times per year    Marital Status: Widowed  Intimate Partner Violence: Not At Risk (12/25/2022)   Humiliation, Afraid, Rape, and Kick questionnaire    Fear of Current or Ex-Partner: No    Emotionally Abused: No    Physically Abused: No    Sexually Abused: No    FAMILY HISTORY: Family History  Problem Relation Age of Onset   Heart disease Father    Hypertension Father    Hyperlipidemia Father     Breast cancer Neg Hx     Review of Systems  Constitutional:  Positive for fatigue. Negative for appetite change, chills, fever and unexpected weight change.  HENT:   Negative for hearing loss, lump/mass and trouble swallowing.   Eyes:  Negative for eye problems and icterus.  Respiratory:  Negative for chest tightness, cough and shortness of breath.   Cardiovascular:  Positive for leg swelling. Negative for chest pain and palpitations.  Gastrointestinal:  Negative for abdominal distention, abdominal pain, constipation, diarrhea, nausea and vomiting.  Endocrine: Negative for hot flashes.  Genitourinary:  Negative for difficulty urinating.   Musculoskeletal:  Negative for arthralgias.  Skin:  Negative for itching and rash.  Neurological:  Negative for dizziness, extremity weakness, headaches and numbness.  Hematological:  Negative for adenopathy. Does not bruise/bleed easily.  Psychiatric/Behavioral:  Negative for depression. The patient is not nervous/anxious.       PHYSICAL EXAMINATION    Vitals:   04/26/23 1342  BP: 117/68  Pulse: (!) 55  Resp: 16  Temp: (!) 97.5 F (36.4 C)  SpO2: 100%    Physical Exam Constitutional:      General: She is not in acute distress.    Appearance: Normal appearance. She is not ill-appearing or toxic-appearing.     Comments: Examined in wheelchair   HENT:     Head: Normocephalic and atraumatic.     Mouth/Throat:     Mouth: Mucous membranes are moist.     Pharynx: Oropharynx is clear. No oropharyngeal exudate or posterior oropharyngeal erythema.  Eyes:     General: No scleral icterus. Cardiovascular:     Rate and Rhythm: Normal rate and regular rhythm.     Pulses: Normal pulses.     Heart sounds: Normal heart sounds.  Pulmonary:     Effort: Pulmonary effort is normal.     Breath sounds: Normal breath sounds.  Abdominal:     General: Abdomen is flat. Bowel sounds are normal. There is no distension.     Palpations: Abdomen is soft.      Tenderness: There is no abdominal tenderness. There is guarding.  Musculoskeletal:        General: Swelling (Chronic venous stasis changes, severe bilateral lower extremity swelling which is significantly better overall.  No overt evidence of cellulitis.  She always has some degree of erythema in her legs.) present.     Cervical back: Neck supple.  Lymphadenopathy:     Cervical: No cervical adenopathy.  Skin:    General: Skin is warm and dry.     Findings: No rash.  Neurological:     General: No focal deficit present.     Mental Status: She is alert.  Psychiatric:        Mood and Affect: Mood normal.        Behavior: Behavior normal.     LABORATORY DATA:  CBC    Component Value  Date/Time   WBC 7.0 04/26/2023 1333   RBC 2.76 (L) 04/26/2023 1333   HGB 8.4 (L) 04/26/2023 1333   HGB 7.6 (L) 04/19/2023 1337   HCT 25.8 (L) 04/26/2023 1333   HCT 28.5 (L) 11/20/2018 1824   PLT 91 (L) 04/26/2023 1333   PLT 136 (L) 04/19/2023 1337   MCV 93.5 04/26/2023 1333   MCH 30.4 04/26/2023 1333   MCHC 32.6 04/26/2023 1333   RDW 19.2 (H) 04/26/2023 1333   LYMPHSABS 1.3 04/26/2023 1333   MONOABS 0.5 04/26/2023 1333   EOSABS 0.4 04/26/2023 1333   BASOSABS 0.1 04/26/2023 1333    CMP     Component Value Date/Time   NA 132 (L) 04/19/2023 1337   NA 137 12/06/2017 1436   K 4.2 04/19/2023 1337   CL 100 04/19/2023 1337   CO2 20 (L) 04/19/2023 1337   GLUCOSE 202 (H) 04/19/2023 1337   BUN 108 (H) 04/19/2023 1337   BUN 20 12/06/2017 1436   CREATININE 3.46 (H) 04/19/2023 1337   CREATININE 2.32 (H) 12/08/2022 1208   CALCIUM 8.9 04/19/2023 1337   PROT 5.8 (L) 04/19/2023 1337   ALBUMIN 3.5 04/19/2023 1337   AST 24 04/19/2023 1337   AST 60 (H) 12/08/2022 1208   ALT 10 04/19/2023 1337   ALT 42 12/08/2022 1208   ALKPHOS 96 04/19/2023 1337   BILITOT 0.5 04/19/2023 1337   BILITOT 0.7 12/08/2022 1208   GFRNONAA 13 (L) 04/19/2023 1337   GFRNONAA 20 (L) 12/08/2022 1208   GFRAA 32 (L)  03/30/2020 0932       ASSESSMENT and THERAPY PLAN:    [name] is a 82 y.o. female who presents for multifactorial anemia, previously treated with rituximab for ANCA positive hemolytic anemia, previously on maintenance rituximab every 12 weeks presented with worsening anemia thought to be related to her hemolysis.  She was diagnosed with elevated ANCA titers 1 is to 640 and previously treated for ANCA positive hemolytic anemia, seen by rheumatology Dr. Carleene Cooper back in 2020, responded at that time to prednisone and rituximab and continued on rituximab maintenance.  However her rituximab was changed to every 42-month infusions and she relapsed before her second cycle of every 33-month rituximab.    # Multifactorial Anemia:  --Bone marrow biopsy from 02/20/2022 showed normocellular marrow with trilineage hematopoiesis. No leukemia, lymphoma or high-grade dysplasia identified. --CT CAP from 02/20/2022 did not show any underlying cause for anemia.  --ANCA titers are normal when checked on 02/10/2022 -- Currently on EPO every 2 weeks.  She most recently received a unit of packed red blood cells and hemoglobin improved to 8.4.  She denies any active bleeding.  Okay to proceed with Depo as scheduled today.  She will return to clinic in 2 weeks with repeat labs and follow-up with one of her APP's.  #Bilateral lower extremity edema significantly improved.  She always has some degree of erythema.  No overt evidence of cellulitis.  I do not see need for antibiotics at this time.   #Warfarin therapy for St. Jude's valve --Under the care of  [facility].     # CKd stage IV, not an ideal candidate for dialysis.  All questions were answered. The patient knows to call the clinic with any problems, questions or concerns. We can certainly see the patient much sooner if necessary.  Total encounter time:30 minutes*in face-to-face visit time, chart review, lab review, care coordination, order entry, and  documentation of the encounter time.   *  Total Encounter Time as defined by the Centers for Medicare and Medicaid Services includes, in addition to the face-to-face time of a patient visit (documented in the note above) non-face-to-face time: obtaining and reviewing outside history, ordering and reviewing medications, tests or procedures, care coordination (communications with other health care professionals or caregivers) and documentation in the medical record.

## 2023-04-30 ENCOUNTER — Ambulatory Visit: Payer: Medicare HMO | Attending: Cardiology | Admitting: *Deleted

## 2023-04-30 DIAGNOSIS — Z952 Presence of prosthetic heart valve: Secondary | ICD-10-CM | POA: Diagnosis not present

## 2023-04-30 DIAGNOSIS — Z5181 Encounter for therapeutic drug level monitoring: Secondary | ICD-10-CM

## 2023-04-30 LAB — POCT INR: INR: 4 — AB (ref 2.0–3.0)

## 2023-04-30 NOTE — Patient Instructions (Signed)
Hold warfarin tonight then decrease dose to 1 tablet daily except 1 1/2 tablets on Mondays Recheck in 2 wks

## 2023-05-01 DIAGNOSIS — N3946 Mixed incontinence: Secondary | ICD-10-CM | POA: Diagnosis not present

## 2023-05-01 DIAGNOSIS — N184 Chronic kidney disease, stage 4 (severe): Secondary | ICD-10-CM | POA: Diagnosis not present

## 2023-05-01 DIAGNOSIS — N3000 Acute cystitis without hematuria: Secondary | ICD-10-CM | POA: Diagnosis not present

## 2023-05-07 NOTE — Progress Notes (Unsigned)
[name], female    DOB: 1940-12-01    MRN: [id number]   Brief patient profile:  61  yowf from [place] healthy child but dx bronchitis about when she started smoking and  quit smokng 2005 with cough resolved and  nl pfts 07/2018   referred to pulmonary clinic in [place]  03/26/2023 by Dr Wylene Simmer  for cough onset Jan 2024 in setting of ANCA pos vasculitis assoc with chronic anemia requiring rituximab and chronic prednisone rx around 2020 and weaned off March 2024.   History of Present Illness  03/26/2023  Pulmonary/ 1st office eval/ [facility] / [name]  Chief Complaint  Patient presents with   Consult  Dyspnea:  rollator at home due to R hip pain  Cough: improved but has fits immediately hs / occ noct/ p stirs > clear mucus / occ at start of meal but no better on max ppi / H2  Sleep: bed is flat/ one pillow/ either side  SABA use: no 02: none   Rec Pantoprazole 40 mg Take 30- 60 min before your first and last meals of the day and pepcid 20 mg x one pill x one hour before bedtime and reduce the pulmicort to once a day  If night time cough not improving :  For drainage / throat tickle try take CHLORPHENIRAMINE  4 mg   GERD diet reviewed, bed blocks rec    Late add:  see ENT re wax impaction   05/08/2023  f/u ov/[place] office/Ettore Trebilcock re: cough > sob  maint on no rx x for max gerd  Chief Complaint  Patient presents with   Chronic Cough   Dyspnea:  using rollator and R hip slows her down more than breathing  Cough: better to her satisfaction on max gerd rx / did not acquire chlorpheniramine  Sleeping: bed blocks x 6 in > planning on taking it to 3 in as having problems not sliding down. SABA use: none  needed  02: none    No obvious day to day or daytime variability or assoc excess/ purulent sputum or mucus plugs or hemoptysis or cp or chest tightness, subjective wheeze or overt sinus or hb symptoms.     Also denies any obvious fluctuation of symptoms with weather or  environmental changes or other aggravating or alleviating factors except as outlined above   No unusual exposure hx or h/o childhood pna/ asthma or knowledge of premature birth.  Current Allergies, Complete Past Medical History, Past Surgical History, Family History, and Social History were reviewed in [name].  ROS  The following are not active complaints unless bolded Hoarseness, sore throat, dysphagia, dental problems, itching, sneezing,  nasal congestion or discharge of excess mucus or purulent secretions, ear ache,   fever, chills, sweats, unintended wt loss or wt gain, classically pleuritic or exertional cp,  orthopnea pnd or arm/hand swelling  or leg swelling, presyncope, palpitations, abdominal pain, anorexia, nausea, vomiting, diarrhea  or change in bowel habits or change in bladder habits, change in stools or change in urine, dysuria, hematuria,  rash, arthralgias, visual complaints, headache, numbness, weakness or ataxia or problems with walking or coordination,  change in mood or  memory. Dry mouth x one month        Current Meds  Medication Sig   acetaminophen (TYLENOL) 325 MG tablet Take 325 mg by mouth every 6 (six) hours as needed for mild pain or fever.   alendronate (FOSAMAX) 70 MG tablet Take  70 mg by mouth once a week.   allopurinol (ZYLOPRIM) 100 MG tablet Take 100 mg by mouth daily.   ALPRAZolam (XANAX) 0.5 MG tablet Take 0.5 mg by mouth daily.   amLODipine (NORVASC) 10 MG tablet Take 1 tablet (10 mg total) by mouth daily.   atorvastatin (LIPITOR) 80 MG tablet Take 1 tablet (80 mg total) by mouth daily at 6 PM.   budesonide (PULMICORT) 0.5 MG/2ML nebulizer solution Take 2 mLs (0.5 mg total) by nebulization 2 (two) times daily.   Cholecalciferol (VITAMIN D3) 50 MCG (2000 UT) capsule Take 2,000 Units by mouth daily with lunch.   cloNIDine (CATAPRES) 0.2 MG tablet Take 0.2 mg by mouth 2 (two) times daily.   Cyanocobalamin (VITAMIN B12) 1000  MCG TBCR Take 1,000 mcg by mouth daily with lunch.   famotidine (PEPCID) 20 MG tablet Take 20 mg by mouth 2 (two) times daily.   ferrous sulfate 325 (65 FE) MG tablet Take 1 tablet (325 mg total) by mouth daily with breakfast. (Patient taking differently: Patient takes 2 tablets by mouth daily.)   furosemide (LASIX) 40 MG tablet Patient takes 2 tablets by mouth in the morning and 1.5 tablets at night.   hydrALAZINE (APRESOLINE) 100 MG tablet Take 1 tablet (100 mg total) by mouth 3 (three) times daily.   insulin aspart (NOVOLOG) 100 UNIT/ML injection Inject 2-8 Units into the skin 3 (three) times daily with meals. Per sliding scale, If blood sugar is 200 to 250, give 2 units. If blood sugar is 251 to 300, give 4 units. If blood sugar is 301 to 350, give 6 units. If blood sugar is greater than 350, give 8 units and call MD.   Insulin Syringe-Needle U-100 (B-D INS SYR ULTRAFINE .3CC/29G) 29G X 1/2" 0.3 ML MISC Use for sliding scale for breakthrough high blood sugar readings   ipratropium-albuterol (DUONEB) 0.5-2.5 (3) MG/3ML SOLN Take 3 mLs by nebulization every 6 (six) hours as needed.   metoprolol succinate (TOPROL-XL) 25 MG 24 hr tablet Take 1 tablet (25 mg total) by mouth daily.   nitrofurantoin (MACRODANTIN) 100 MG capsule Take 100 mg by mouth 2 (two) times daily.   ondansetron (ZOFRAN-ODT) 4 MG disintegrating tablet Take 4 mg by mouth every 8 (eight) hours as needed for nausea or vomiting.   pantoprazole (PROTONIX) 40 MG tablet Take 1 tablet (40 mg total) by mouth 2 (two) times daily.   senna-docusate (SENOKOT-S) 8.6-50 MG tablet Take 2 tablets by mouth at bedtime.   sertraline (ZOLOFT) 25 MG tablet Take 25 mg by mouth daily.   spironolactone (ALDACTONE) 25 MG tablet Take 25 mg by mouth daily.   tadalafil, PAH, (ADCIRCA) 20 MG tablet TAKE 2 TABLETS BY MOUTH ONCE DAILY   warfarin (COUMADIN) 5 MG tablet Take 1 tablet (5 mg total) by mouth See admin instructions. Take 1 tablet 5mg  on  Tuesday/Thursday/Sunday and 1/2 Tab (2.5 mg) on MWFSat (Patient taking differently: Take 5 mg by mouth See admin instructions. Take 5 mg once daily. Pt's has not been checked in over a week. Per daughter)             Past Medical History:  Diagnosis Date   Aortic atherosclerosis (HCC) 01/23/2018   Atrial fibrillation (HCC)    Benign positional vertigo 10/06/2011   Carotid artery disease [facility])    Chemotherapy induced neutropenia (HCC) 07/21/2019   Cholelithiasis 01/23/2018   Cholelithiasis 01/23/2018   Chronic anticoagulation    Colovesical fistula, ruled out 04/14/2022   Coronary  artery disease    status post coronary artery bypass grafting times 07/10/2004   Diabetes mellitus    Hypercholesteremia    Hypertension    Mechanical heart valve present    H. aortic valve replacement at the time of bypass surgery October 2005   Moderate to severe pulmonary hypertension (HCC)    Peripheral arterial disease (HCC)    history of left common iliac artery PTA and stenting for a chronic total occlusion 08/26/01   S/P cholecystectomy 03/06/2018        Objective:     Wt Readings from Last 3 Encounters:  05/08/23 113 lb (51.3 kg)  04/26/23 112 lb 11.2 oz (51.1 kg)  03/29/23 111 lb 11.2 oz (50.7 kg)      Vital signs reviewed  05/08/2023  - Note at rest 02 sats  97% on RA   General appearance:    w/c bound elderly wm slt squeaky voice   not reproduced       HEENT : Oropharynx  clear/ edentulous/ Ears clear s cough triggered by otisscope         NECK :  without  apparent JVD/ palpable Nodes/TM    LUNGS: no acc muscle use,  Nl contour chest which is clear to A and P bilaterally without cough on insp or exp maneuvers   CV:  RRR  no s3 or Mechanical S2  or increase in P2, and  1+ ptting both LEs  ABD:  soft and nontender with nl inspiratory excursion in the supine position. No bruits or organomegaly appreciated   MS:  Nl gait/ ext warm without deformities Or obvious joint  restrictions  calf tenderness, cyanosis or clubbing    SKIN: warm and dry without lesions    NEURO:  alert, approp, nl sensorium with  no motor or cerebellar deficits apparent.      CXR PA and Lateral:   03/26/2023 :    I personally reviewed images and impression is as follows:     CM/ non-specific markings but much improved aeration vs 01/21/23      Assessment

## 2023-05-08 ENCOUNTER — Ambulatory Visit: Payer: Medicare HMO | Admitting: Internal Medicine

## 2023-05-08 ENCOUNTER — Encounter: Payer: Self-pay | Admitting: Internal Medicine

## 2023-05-08 VITALS — BP 112/59 | HR 54 | Ht 59.0 in | Wt 113.0 lb

## 2023-05-08 DIAGNOSIS — R053 Chronic cough: Secondary | ICD-10-CM

## 2023-05-08 NOTE — Assessment & Plan Note (Signed)
Onset Jan 2024 refractory to acid suppression/ most symptomatic at hs and assoc with ear wax impaction Bilaterally - 03/26/2023 rec bed blocks/ continued max gerd rx then if persist try  1st gen H1 blockers per guidelines  > some better 05/08/2023 but did not  try H1 blockers  I really see no evidence this is cough is generated by any lung condition but assoc with hoarseness and dry mouth makes it more likely Upper airway cough syndrome (previously labeled PNDS),  is so named because it's frequently impossible to sort out how much is  CR/sinusitis with freq throat clearing (which can be related to primary GERD)   vs  causing  secondary (" extra esophageal")  GERD from wide swings in gastric pressure that occur with throat clearing, often  promoting self use of mint and menthol lozenges that reduce the lower esophageal sphincter tone and exacerbate the problem further in a cyclical fashion.   These are the same pts (now being labeled as having "irritable larynx syndrome" by some cough centers) who not infrequently have a history of having failed to tolerate ace inhibitors,  dry powder inhalers or biphosphonates or report having atypical/extraesophageal reflux symptoms that don't respond to standard doses of PPI  and are easily confused as having aecopd or asthma flares by even experienced allergists/ pulmonologists (myself included).   Rec Continue max gerd rx/ rechallenge with h1 at hs as previously rec Duoneb with budesonide prn (presently not using and cough and breathing if anything have improved off it ENT f/u next step and return here prn          Each maintenance medication was reviewed in detail including emphasizing most importantly the difference between maintenance and prns and under what circumstances the prns are to be triggered using an action plan format where appropriate.  Total time for H and P, chart review, counseling, reviewing neb device(s) and generating customized AVS unique to  this office visit / same day charting > 30 min final summary ov

## 2023-05-08 NOTE — Patient Instructions (Signed)
For drainage / throat tickle try take CHLORPHENIRAMINE  4 mg  ("Allergy Relief" 4mg   at [facility] should be easiest to find in the blue box usually on bottom shelf)  take one every 4 hours as needed - extremely effective and inexpensive over the counter- may cause drowsiness so start with just a dose or two an hour before bedtime and see how you tolerate it before trying in daytime.   Dry mouth is probably from clonidine but may also be due to nasal obstruction so I would recommend another ENT evaluation.   Pulmonary follow up is as needed

## 2023-05-09 ENCOUNTER — Ambulatory Visit: Payer: Self-pay

## 2023-05-09 NOTE — Patient Outreach (Signed)
  Care Coordination   Follow Up Visit Note   05/09/2023 Name: [name] MRN: [id number] DOB: Jun 09, 1941  [name] is a 82 y.o. year old female who sees [name], [name] Koh, MD for primary care. I spoke with daughter [name] by phone today.  What matters to the patients health and wellness today?  Patient would like to recover from her UTI without complications.     Goals Addressed             This Visit's Progress    COMPLETED: RN Care Coordination Activities: further follow up needed       Care Coordination Interventions: Evaluation of current treatment plan related to chronic cough and patient's adherence to plan as established by provider Spoke with patient's daughter [name] Reviewed and discussed recent f/u with Dr. Sherene [name], Pulmonologist completed on 05/08/23 Review of patient status, including review of consultant's reports, relevant laboratory and other test results, and medications completed Determined patient's cough has greatly improved, she will continue to follow up with Dr. Sherene [name] as needed     To recover from UTI       Care Coordination Interventions: Completed successful outbound call with daughter Start [name]  Evaluation of current treatment plan related to UTI and patient's adherence to plan as established by provider Determined patient was diagnosed with a UTI during a regular check up  Reviewed prescribed antibiotic with daughter [place] [name] and discussed importance of medication adherence making sure to complete full course of medicine  Educated on the importance of staying well hydrated with water, aiming for 48-64 oz daily unless otherwise directed Instructed to notify MD of new symptoms or concerns    Interventions Today    Flowsheet Row Most Recent Value  Chronic Disease   Chronic disease during today's visit Other  [chronic cough,  UTI]  General Interventions   General Interventions Discussed/Reviewed General Interventions Discussed, General Interventions  Reviewed, Doctor Visits  Doctor Visits Discussed/Reviewed Doctor Visits Discussed, Doctor Visits Reviewed, PCP  Education Interventions   Education Provided Provided Education  Provided Verbal Education On When to see the doctor, Medication, Nutrition  Nutrition Interventions   Nutrition Discussed/Reviewed Fluid intake, Nutrition Reviewed, Nutrition Discussed  Pharmacy Interventions   Pharmacy Dicussed/Reviewed Pharmacy Topics Discussed, Pharmacy Topics Reviewed, Medications and their functions          SDOH assessments and interventions completed:  No     Care Coordination Interventions:  Yes, provided   Follow up plan: Follow up call scheduled for 06/28/23 @09 :00 AM    Encounter Outcome:  Pt. Visit Completed

## 2023-05-09 NOTE — Patient Instructions (Signed)
Visit Information  Thank you for taking time to visit with me today. Please don't hesitate to contact me if I can be of assistance to you.   Following are the goals we discussed today:   Goals Addressed             This Visit's Progress    COMPLETED: RN Care Coordination Activities: further follow up needed       Care Coordination Interventions: Evaluation of current treatment plan related to chronic cough and patient's adherence to plan as established by provider Spoke with patient's daughter [name] Reviewed and discussed recent f/u with Dr. Sherene Sires, Pulmonologist completed on 05/08/23 Review of patient status, including review of consultant's reports, relevant laboratory and other test results, and medications completed Determined patient's cough has greatly improved, she will continue to follow up with Dr. Sherene Sires as needed     To recover from UTI       Care Coordination Interventions: Completed successful outbound call with daughter [place]  Evaluation of current treatment plan related to UTI and patient's adherence to plan as established by provider Determined patient was diagnosed with a UTI during a regular check up  Reviewed prescribed antibiotic with daughter [place] and discussed importance of medication adherence making sure to complete full course of medicine  Educated on the importance of staying well hydrated with water, aiming for 48-64 oz daily unless otherwise directed Instructed to notify MD of new symptoms or concerns        Our next appointment is by telephone on 06/28/23 at 09:00 AM  Please call the care guide team at [phone] if you need to cancel or reschedule your appointment.   If you are experiencing a Mental Health or Behavioral Health Crisis or need someone to talk to, please call 1-800-273-TALK (toll free, 24 hour hotline)  Patient verbalizes understanding of instructions and care plan provided today and agrees to view in MyChart. Active MyChart status  and patient understanding of how to access instructions and care plan via MyChart confirmed with patient.     Delsa Sale, RN, BSN, CCM Care Management Coordinator [facility] Management  Direct Phone: [phone]

## 2023-05-10 ENCOUNTER — Inpatient Hospital Stay: Payer: Medicare HMO

## 2023-05-10 DIAGNOSIS — I130 Hypertensive heart and chronic kidney disease with heart failure and stage 1 through stage 4 chronic kidney disease, or unspecified chronic kidney disease: Secondary | ICD-10-CM | POA: Diagnosis not present

## 2023-05-10 DIAGNOSIS — N184 Chronic kidney disease, stage 4 (severe): Secondary | ICD-10-CM | POA: Diagnosis not present

## 2023-05-10 DIAGNOSIS — Z7901 Long term (current) use of anticoagulants: Secondary | ICD-10-CM | POA: Diagnosis not present

## 2023-05-10 DIAGNOSIS — E1122 Type 2 diabetes mellitus with diabetic chronic kidney disease: Secondary | ICD-10-CM | POA: Diagnosis not present

## 2023-05-10 DIAGNOSIS — I5032 Chronic diastolic (congestive) heart failure: Secondary | ICD-10-CM | POA: Diagnosis not present

## 2023-05-10 DIAGNOSIS — I482 Chronic atrial fibrillation, unspecified: Secondary | ICD-10-CM | POA: Diagnosis not present

## 2023-05-10 DIAGNOSIS — I739 Peripheral vascular disease, unspecified: Secondary | ICD-10-CM | POA: Diagnosis not present

## 2023-05-10 DIAGNOSIS — E1151 Type 2 diabetes mellitus with diabetic peripheral angiopathy without gangrene: Secondary | ICD-10-CM | POA: Diagnosis not present

## 2023-05-10 NOTE — Progress Notes (Signed)
[facility] OFFICE PROGRESS NOTE  [name], [name] Koh, MD [address] [place] [address]  DIAGNOSIS: with multifactorial anemia, as follows:             (a) anemia of chronic inflammation: As of May 2020 she has a SED rate of 95, CRP 1.1, positive ANA and RF; subsequently she was found to be ANCA positive             (b) hemolysis (mild): she has a mildly elevated LDH and a DAT positive for complement, not IgG; this is c/w (a) above             (c) anemia of renal insufficiency: inadequate EPO resonse to anemia                         (d) GIB/ iron deficiency in patient on lifelong anticoagulaton   (1) Feraheme received 03/01/2019, repeated 03/20/2019              (a) reticulocyte 03/07/2019 up from 43.4 a year ago to 191.8 after iron infusion             (b) feraheme repeated on 05/12/2019             (c) subsequent ferritin levels >100             (D) Ferrliceit 250 mg last given 06/25/22   (2) darbepoetin: starting 05/05/2019 at given every 2 weeks             (a) dose increased to 200 mcg every week starting 07/01/2019              (b) back to every 2 weeks beginning 08/04/2019 still at 200 mcg dose             (c) switched to Retacrit every 4 weeks as of 08/18/2019   (3) ANCA positive vasculitis: diagnosed 05/08/2019 (titer 1:640)             (a) prednisone 40 mg daily started 05/31/2019             (b) rituximab weekly started 06/12/2019 (received test dose 06/05/2019)                         (i) hepatitis B sAg, core Ab and HIV negative 05/08/2019                         (ii) rituximab held after 07/14/2019 dose (received 5 weekly doses) with neutropenia                         (iii) rituximab resumed 08/18/2019 but changed to monthly                         (iv) rituximab changed to every 2 months beginning with 02/02/2020 dose                         (v) rituximab changed to every 12 weeks after the 10/12/2020 dose             (c) prednisone tapered  to off as of 09/15/2019                          (4) Anemia worsened in spring 2023,  refractory to Rituximab and requiring prednisone restart with slow taper. This was completed in March 2024 per chart review.    (5) osteopenia with a T score of -1.5 on bone density 02/28/2016 at the [facility] center  CURRENT THERAPY:  1) Retacrit every 2 weeks 2) Supportive care   INTERVAL HISTORY: [name] 82 y.o. female returns to the today for a follow-up visit accompanied by her daughter.  The patient has multiple medical problems including CKD, atrial fibrillation/mechanical heart valve on warfarin, hx GI bleeding, etc.   The patient was last seen by Dr. Al Pimple on 04/26/2023.  The patient is followed for history of anemia for which she currently receives Retacrit injections every two weeks. She used to require frequent blood transfusions, however, she then had stable anemia. However, her last blood transfusion was about 1 month ago on 04/19/23. It appears she is starting to have some new worsening anemia and need for blood transfusions recently. Her Hbg is downtrending from her last visit at [facility] and from her labs drawn at her nephrologist on 7/22 (per patient daughter report-I do not have Hbg value from that day).  She is seen at the coumadin clinic. When seen on 7/22, her INR was elevated at 4.0. Her warfarin dose was adjusted. They are not certain but believe on Mondays she takes 7.5 mg and Tues-Sunday takes 5 mg daily. She is scheduled to have this rechecked next week on 05/15/23. She mentions that she bleed more than normal when she put her Josephine Igo on her arm. She is on warfarin for history of atrial fibrillation and mechanical valve that was performed in 2005.   However, around this same time of worsening anemia and elevated INR, she tells me today she has been having rectal bleeding with bowel movements. She reportedly had severe constipation about one month ago. According to her colonoscopy from 2019 she  dose have internal hemorrhoids and diverticulosis. She reports the blood is in the stool or a little on the toilet paper. She states it is dark red. The bleeding is only with bowel movements. Denies any large blood clots or bright red blood. She reached out to her PCP who advised for her to monitor for now. She previously saw Dr. Marca Ancona from GI last year and had upper endoscopy erosive gastropathy. She had diverticulitis in September of last year. At this time, she denies any associated abdominal pain, fevers, nausea, or vomiting. Denies significant constipation or changes with her bowel habits at this time. The patient currently lives in [place] with her daughter. Therefore, they are hoping to establish with a gastroenterologist in [place].   Besides the bleeding from the rectum, easy bruising/senile purpura, and some mild bleeding after placing her Josephine Igo on her arm, she denies any other bleeding such as epistaxis, gingival bleeding, hemoptysis, hematemesis, or hematuria.   Interval since last seen, she saw Dr. Kathrene Bongo at [facility] due to her worsening kidney function.  They recommended optimal blood pressure and blood sugar control.  She was a little hypotensive at that appointment.  They recommended decreasing her clonidine back to 0.2 because of some hypotension. She still is on lasix 80/60 since this has been controlling her lower extremity edema. She states her swelling is stable today. She states she has not noticed any changes in her urination. She is drinking water during the encounter today and trying to stay hydrated.   Denies any fever, chills, night sweats, or unexplained weight loss. She is having some more  fatigue compared to her last appointment.  She recently saw Dr. Sherene Sires and pulmonary medicine. She sometimes has dyspnea on exertion. Denies any syncope.   She is here for evaluation and repeat blood work.   MEDICAL HISTORY: Past Medical History:  Diagnosis Date    Aortic atherosclerosis (HCC) 01/23/2018   Atrial fibrillation (HCC)    Benign positional vertigo 10/06/2011   Carotid artery disease (HCC)    Chemotherapy induced neutropenia (HCC) 07/21/2019   Cholelithiasis 01/23/2018   Cholelithiasis 01/23/2018   Chronic anticoagulation    Colovesical fistula, ruled out 04/14/2022   Coronary artery disease    status post coronary artery bypass grafting times 07/10/2004   Diabetes mellitus    Hypercholesteremia    Hypertension    Mechanical heart valve present    H. aortic valve replacement at the time of bypass surgery October 2005   Moderate to severe pulmonary hypertension (HCC)    Peripheral arterial disease (HCC)    history of left common iliac artery PTA and stenting for a chronic total occlusion 08/26/01   S/P cholecystectomy 03/06/2018    ALLERGIES:  is allergic to augmentin [amoxicillin-pot clavulanate], ciprofloxacin, codeine, coreg [carvedilol], diflucan [fluconazole], levofloxacin in d5w, losartan, privigen [immune globulin (human)], sulfamethoxazole-trimethoprim, verapamil, zetia [ezetimibe], zocor [simvastatin - high dose], and gatifloxacin.  MEDICATIONS:  Current Outpatient Medications  Medication Sig Dispense Refill   acetaminophen (TYLENOL) 325 MG tablet Take 325 mg by mouth every 6 (six) hours as needed for mild pain or fever.     allopurinol (ZYLOPRIM) 100 MG tablet Take 100 mg by mouth daily.     ALPRAZolam (XANAX) 0.5 MG tablet Take 0.5 mg by mouth daily.     amLODipine (NORVASC) 10 MG tablet Take 1 tablet (10 mg total) by mouth daily. 90 tablet 3   atorvastatin (LIPITOR) 80 MG tablet Take 1 tablet (80 mg total) by mouth daily at 6 PM.     budesonide (PULMICORT) 0.5 MG/2ML nebulizer solution Take 2 mLs (0.5 mg total) by nebulization 2 (two) times daily. 120 mL 1   Cholecalciferol (VITAMIN D3) 50 MCG (2000 UT) capsule Take 2,000 Units by mouth daily with lunch.     cloNIDine (CATAPRES) 0.2 MG tablet Take 0.2 mg by mouth 2 (two)  times daily.     Cyanocobalamin (VITAMIN B12) 1000 MCG TBCR Take 1,000 mcg by mouth daily with lunch.     famotidine (PEPCID) 20 MG tablet Take 20 mg by mouth 2 (two) times daily.     ferrous sulfate 325 (65 FE) MG tablet Take 1 tablet (325 mg total) by mouth daily with breakfast. (Patient taking differently: Patient takes 2 tablets by mouth daily.)  3   furosemide (LASIX) 40 MG tablet Patient takes 2 tablets by mouth in the morning and 1.5 tablets at night.     hydrALAZINE (APRESOLINE) 100 MG tablet Take 1 tablet (100 mg total) by mouth 3 (three) times daily. 270 tablet 3   insulin aspart (NOVOLOG) 100 UNIT/ML injection Inject 2-8 Units into the skin 3 (three) times daily with meals. Per sliding scale, If blood sugar is 200 to 250, give 2 units. If blood sugar is 251 to 300, give 4 units. If blood sugar is 301 to 350, give 6 units. If blood sugar is greater than 350, give 8 units and call MD.     Insulin Syringe-Needle U-100 (B-D INS SYR ULTRAFINE .3CC/29G) 29G X 1/2" 0.3 ML MISC Use for sliding scale for breakthrough high blood sugar readings 100 each 1  ipratropium-albuterol (DUONEB) 0.5-2.5 (3) MG/3ML SOLN Take 3 mLs by nebulization every 6 (six) hours as needed. 360 mL 0   metoprolol succinate (TOPROL-XL) 25 MG 24 hr tablet Take 1 tablet (25 mg total) by mouth daily. 30 tablet 3   nitrofurantoin (MACRODANTIN) 100 MG capsule Take 100 mg by mouth 2 (two) times daily.     ondansetron (ZOFRAN-ODT) 4 MG disintegrating tablet Take 4 mg by mouth every 8 (eight) hours as needed for nausea or vomiting.     pantoprazole (PROTONIX) 40 MG tablet Take 1 tablet (40 mg total) by mouth 2 (two) times daily. 60 tablet 2   senna-docusate (SENOKOT-S) 8.6-50 MG tablet Take 2 tablets by mouth at bedtime. 60 tablet 2   sertraline (ZOLOFT) 25 MG tablet Take 25 mg by mouth daily.     simethicone (GAS-X) 80 MG chewable tablet Chew 1 tablet (80 mg total) by mouth 4 (four) times daily as needed for flatulence. 100  tablet 2   spironolactone (ALDACTONE) 25 MG tablet Take 25 mg by mouth daily.     tadalafil, PAH, (ADCIRCA) 20 MG tablet TAKE 2 TABLETS BY MOUTH ONCE DAILY 60 tablet 11   warfarin (COUMADIN) 5 MG tablet Take 1 tablet (5 mg total) by mouth See admin instructions. Take 1 tablet 5mg  on Tuesday/Thursday/Sunday and 1/2 Tab (2.5 mg) on MWFSat (Patient taking differently: Take 5 mg by mouth See admin instructions. Take 5 mg once daily. Pt's has not been checked in over a week. Per daughter)     No current facility-administered medications for this visit.   Facility-Administered Medications Ordered in Other Visits  Medication Dose Route Frequency Provider Last Rate Last Admin   0.9 %  sodium chloride infusion (Manually program via Guardrails IV Fluids)  250 mL Intravenous Once ,  L, PA-C        SURGICAL HISTORY:  Past Surgical History:  Procedure Laterality Date   anterior repair  2009   AORTIC VALVE REPLACEMENT  2005   Dr. Laneta Simmers   CARDIAC CATHETERIZATION  11/10/2004   40% right common illiac, 70% in stent restenosis of distal left common illiac,    CARDIAC CATHETERIZATION  05/18/2004   LAD 50-70% midstenosis, RCA dominant w/50% stenosis, 50% Right common Illiac artery ostial stenosis, 90% in stent restenosis within midportion of left common illiac stent   Carotid Duplex  03/12/2012   RSA-elev. velocities suggestive of a 50-69% diameter reduction, Right&Left Bulb/Prox ICA-mild-mod.fibrous plaqueelevating Velocities abnormal study.   CHOLECYSTECTOMY N/A 03/01/2018   Procedure: LAPAROSCOPIC CHOLECYSTECTOMY;  Surgeon: Jimmye Norman, MD;  Location: [facility] OR;  Service: General;  Laterality: N/A;   COLONOSCOPY WITH PROPOFOL N/A 01/22/2018   Procedure: COLONOSCOPY WITH PROPOFOL;  Surgeon: Charlott Rakes, MD;  Location: [facility] ENDOSCOPY;  Service: Endoscopy;  Laterality: N/A;   ESOPHAGOGASTRODUODENOSCOPY N/A 07/13/2022   Procedure: ESOPHAGOGASTRODUODENOSCOPY (EGD);  Surgeon: Kerin Salen,  MD;  Location: [facility] ENDOSCOPY;  Service: Gastroenterology;  Laterality: N/A;   ESOPHAGOGASTRODUODENOSCOPY (EGD) WITH PROPOFOL N/A 01/22/2018   Procedure: ESOPHAGOGASTRODUODENOSCOPY (EGD) WITH PROPOFOL;  Surgeon: Charlott Rakes, MD;  Location: [facility] ENDOSCOPY;  Service: Endoscopy;  Laterality: N/A;   ESOPHAGOGASTRODUODENOSCOPY (EGD) WITH PROPOFOL N/A 11/21/2018   Procedure: ESOPHAGOGASTRODUODENOSCOPY (EGD) WITH PROPOFOL;  Surgeon: Kerin Salen, MD;  Location: [facility] ENDOSCOPY;  Service: Gastroenterology;  Laterality: N/A;   ESOPHAGOGASTRODUODENOSCOPY (EGD) WITH PROPOFOL Left 02/27/2019   Procedure: ESOPHAGOGASTRODUODENOSCOPY (EGD) WITH PROPOFOL;  Surgeon: Willis Modena, MD;  Location: [facility] ENDOSCOPY;  Service: Endoscopy;  Laterality: Left;   GIVENS CAPSULE STUDY N/A 11/21/2018  Procedure: GIVENS CAPSULE STUDY;  Surgeon: Kerin Salen, MD;  Location: [facility] ENDOSCOPY;  Service: Gastroenterology;  Laterality: N/A;  To be deployed during EGD   Lower Ext. Duplex  03/12/2012   Right Proximal CIA- vessel narrowing w/elevated velocities 0-49% diameter reduction. Right SFA-mild mixed density plaque throughout vessel.   NM MYOCAR PERF WALL MOTION  05/19/2010   protocol: Persantine, post stress EF 65%, negative for ischemia, low risk scan   RIGHT HEART CATH N/A 06/27/2018   Procedure: RIGHT HEART CATH;  Surgeon: Laurey Morale, MD;  Location: [facility] INVASIVE CV LAB;  Service: Cardiovascular;  Laterality: N/A;   TOTAL ABDOMINAL HYSTERECTOMY W/ BILATERAL SALPINGOOPHORECTOMY  1989   TRANSTHORACIC ECHOCARDIOGRAM  08/29/2012   Moderately calcified annulus of mitral valve, moderate regurg. of both mitral valve and tricuspid valve.     REVIEW OF SYSTEMS:   Review of Systems  Constitutional: Positive for fatigue. Negative for appetite change, chills, fever and unexpected weight change.  HENT: Negative for mouth sores, nosebleeds, sore throat and trouble swallowing.   Eyes: Negative for eye problems and icterus.  Respiratory:  Negative for cough, hemoptysis, shortness of breath and wheezing.   Cardiovascular: Positive for stable leg swelling. Negative for chest pain.  Gastrointestinal: Positive for blood in the stool the last 2-3 weeks. Negative for abdominal pain, constipation, diarrhea, nausea and vomiting.  Genitourinary: Negative for bladder incontinence, difficulty urinating, dysuria, frequency and hematuria.   Musculoskeletal: Negative for back pain, gait problem, neck pain and neck stiffness.  Skin: Negative for itching and rash.  Neurological: Negative for dizziness, extremity weakness, gait problem, headaches, light-headedness and seizures.  Hematological: Negative for adenopathy. Positive for easy bruising Psychiatric/Behavioral: Negative for confusion, depression and sleep disturbance. The patient is not nervous/anxious.     PHYSICAL EXAMINATION:  Blood pressure (!) 125/47, pulse (!) 53, temperature 97.8 F (36.6 C), temperature source Oral, resp. rate 15, height 4\' 11"  (1.499 m), weight 115 lb 1.6 oz (52.2 kg), SpO2 100%.  ECOG PERFORMANCE STATUS: 2-3  Physical Exam  Constitutional: Oriented to person, place, and time and chronically ill appearing female and in no distress.  HENT:  Head: Normocephalic and atraumatic.  Mouth/Throat: Oropharynx is clear and moist. No oropharyngeal exudate.  Eyes: Conjunctivae are normal. Right eye exhibits no discharge. Left eye exhibits no discharge. No scleral icterus.  Neck: Normal range of motion. Neck supple.  Cardiovascular: Normal rate, regular rhythm, Mechanical valve noted and intact distal pulses.   Pulmonary/Chest: Effort normal and breath sounds normal. No respiratory distress. No wheezes. No rales.  Abdominal: Soft. Bowel sounds are normal. Exhibits no distension and no mass. There is no tenderness.  Musculoskeletal: Normal range of motion. Lower extremity edema (stable per patient/daughter) Lymphadenopathy:    No cervical adenopathy.  Neurological:  Alert and oriented to person, place, and time. Exhibits muscle wasting. Examined in the wheelchair.  Skin: Skin is warm and dry. No rash noted. Not diaphoretic. No erythema. No pallor.  Psychiatric: Mood, memory and judgment normal.  Vitals reviewed.  LABORATORY DATA: Lab Results  Component Value Date   WBC 9.4 05/11/2023   HGB 7.3 (L) 05/11/2023   HCT 23.1 (L) 05/11/2023   MCV 95.9 05/11/2023   PLT 92 (L) 05/11/2023      Chemistry      Component Value Date/Time   NA 133 (L) 05/11/2023 0954   NA 137 12/06/2017 1436   K 4.4 05/11/2023 0954   CL 101 05/11/2023 0954   CO2 22 05/11/2023 0954   BUN  105 (H) 05/11/2023 0954   BUN 20 12/06/2017 1436   CREATININE 3.22 (H) 05/11/2023 0954   CREATININE 2.32 (H) 12/08/2022 1208      Component Value Date/Time   CALCIUM 8.4 (L) 05/11/2023 0954   ALKPHOS 103 05/11/2023 0954   AST 28 05/11/2023 0954   AST 60 (H) 12/08/2022 1208   ALT 12 05/11/2023 0954   ALT 42 12/08/2022 1208   BILITOT 0.5 05/11/2023 0954   BILITOT 0.7 12/08/2022 1208       RADIOGRAPHIC STUDIES:  No results found.   ASSESSMENT/PLAN:  MYLAH BAYNES is a 82 y.o. female who presents for:    # Multifactorial Anemia:  1) anemia of chronic inflammation and mild hemolysis: As of May 2020 she has a SED rate of 95, CRP 1.1, positive ANA and RF; subsequently she was found to be ANCA positive. Previously treated with rituxan and prednisone. Prednisone discontinued in March 2024 seen by rheumatology Dr. Carleene Cooper back in 2020 2)  anemia of renal insufficiency: Currently on EPO every 2 weeks. Sees Dr. Jacelyn Pi  from [facility], most recent on 04/30/23.  3) IDA and blood loss, currently on anticoagulation with warfarin due to mechanical valve and atrial fibrillation.    #Bilateral lower extremity edema significantly improved.   -Stable per patient report   #Warfarin therapy for [employer] Jude[employer] valve in 2005 --Under the care of  Coumadin Clinic.  -INR elevated  at 4.0 on 04/30/23, has noticed increase bruising, rectal bleeding, and prolonged bleeding after placement of Libre for BS monitoring -CHADS2-VASc is 7 (all but hx of stroke)  -Patient believes she is on 7.5 mg on Mondays, and 5 mg daily of warfarin Tues-Sunday   # CKD stage IV, not an ideal candidate for dialysis. Saw Dr. Jacelyn Pi recently.  -Recommended optimal BP control -Noted to be poor candidate for dialysis -GFR ranges from 13-16 on average over the last month. GFR 14 today.    Plan Her Hbg is downtrending at 7.3 today. Endorsing presence of blood in the stool over the last 2-3 weeks, correlating with timeframe when elevated INR of 4.0 on 7/22. Discussed her case with Dr. Pamelia Hoit. We will try to manage/support her outpatient. Will arrange for 2 units of blood to be administered today. Dr. Pamelia Hoit recommended reaching out to cardiology about holding warfarin for 1 week. CHADS2-VASc is 7 (all but hx of stroke). Discussed with cardiology who agreed to hold warfarin for 1 week until the bleeding is stabilized. INR rechecked today and is 3.3 today (goal 2-3). The coumadin clinic is going to reach out to her nurse in [place] so she is aware of the plan when they see her next week on 05/15/23, and she should reach out to Ms. Cameka as well and confirm to restart in a week. The patient and her daughter are aware of this plan. Given her complicated GI history, I will refer her to GI in [place] to establish care and see if further evaluation is needed for the rectal bleeding. I have placed urgent referral. She is unable to see GI in [place] since she currently lives in [place] with her daughter.   She will receive epo today as scheduled. Continue EPO every 2 weeks.   Would recommend close interval follow up with labs and provider visit in 1 week. I have added sample to blood bank to be drawn at every visit. I also will recheck iron studies and ferritin to ensure she does not need IV iron infusions since  this has not been checked recently.    Strict return and emergency precautions given. Should she develop worsening symptoms of anemia (fatigue, weakness, shortness of breath), stroke/MI, bleeding, or GI symptoms (fevers, abdominal pain, changes in bowel habits) she would need to be evaluated promptly.    The patient was advised to call immediately if she has any concerning symptoms in the interval. The patient voices understanding of current disease status and treatment options and is in agreement with the current care plan. All questions were answered. The patient knows to call the clinic with any problems, questions or concerns. We can certainly see the patient much sooner if necessary    Orders Placed This Encounter  Procedures   Informed Consent Details: Physician/Practitioner Attestation; Transcribe to consent form and obtain patient signature    Standing Status:   Standing    Number of Occurrences:   1    Order Specific Question:   Physician/Practitioner attestation of informed consent for blood and or blood product transfusion    Answer:   I, the physician/practitioner, attest that I have discussed with the patient the benefits, risks, side effects, alternatives, likelihood of achieving goals and potential problems during recovery for the procedure that I have provided informed consent.    Order Specific Question:   Product(s)    Answer:   All Product(s)   Sample to Blood Bank    Standing Status:   Standing    Number of Occurrences:   10    Standing Expiration Date:   05/10/2024      The total time spent in the appointment was 50+ minutes   L , PA-C 05/11/23

## 2023-05-11 ENCOUNTER — Other Ambulatory Visit: Payer: Self-pay

## 2023-05-11 ENCOUNTER — Inpatient Hospital Stay: Payer: Medicare HMO

## 2023-05-11 ENCOUNTER — Other Ambulatory Visit: Payer: Self-pay | Admitting: Physician Assistant

## 2023-05-11 ENCOUNTER — Inpatient Hospital Stay: Payer: Medicare HMO | Admitting: Physician Assistant

## 2023-05-11 ENCOUNTER — Inpatient Hospital Stay: Payer: Medicare HMO | Attending: Oncology

## 2023-05-11 VITALS — BP 162/48 | HR 51 | Temp 97.5°F | Resp 18

## 2023-05-11 VITALS — BP 125/47 | HR 53 | Temp 97.8°F | Resp 15 | Ht 59.0 in | Wt 115.1 lb

## 2023-05-11 DIAGNOSIS — E611 Iron deficiency: Secondary | ICD-10-CM | POA: Insufficient documentation

## 2023-05-11 DIAGNOSIS — N184 Chronic kidney disease, stage 4 (severe): Secondary | ICD-10-CM | POA: Diagnosis not present

## 2023-05-11 DIAGNOSIS — D500 Iron deficiency anemia secondary to blood loss (chronic): Secondary | ICD-10-CM

## 2023-05-11 DIAGNOSIS — K922 Gastrointestinal hemorrhage, unspecified: Secondary | ICD-10-CM

## 2023-05-11 DIAGNOSIS — I7782 Antineutrophilic cytoplasmic antibody (ANCA) vasculitis: Secondary | ICD-10-CM | POA: Insufficient documentation

## 2023-05-11 DIAGNOSIS — M8580 Other specified disorders of bone density and structure, unspecified site: Secondary | ICD-10-CM | POA: Diagnosis not present

## 2023-05-11 DIAGNOSIS — D591 Autoimmune hemolytic anemia, unspecified: Secondary | ICD-10-CM | POA: Insufficient documentation

## 2023-05-11 DIAGNOSIS — D631 Anemia in chronic kidney disease: Secondary | ICD-10-CM | POA: Diagnosis not present

## 2023-05-11 DIAGNOSIS — D638 Anemia in other chronic diseases classified elsewhere: Secondary | ICD-10-CM

## 2023-05-11 LAB — BPAM RBC
Blood Product Expiration Date: 202408212359
Blood Product Expiration Date: 202408222359
ISSUE DATE / TIME: 202408021307
ISSUE DATE / TIME: 202408021307
Unit Type and Rh: 6200
Unit Type and Rh: 6200

## 2023-05-11 LAB — CBC WITH DIFFERENTIAL/PLATELET
Abs Immature Granulocytes: 0.02 10*3/uL (ref 0.00–0.07)
Basophils Absolute: 0 10*3/uL (ref 0.0–0.1)
Basophils Relative: 0 %
Eosinophils Absolute: 0.3 10*3/uL (ref 0.0–0.5)
Eosinophils Relative: 3 %
HCT: 23.1 % — ABNORMAL LOW (ref 36.0–46.0)
Hemoglobin: 7.3 g/dL — ABNORMAL LOW (ref 12.0–15.0)
Immature Granulocytes: 0 %
Lymphocytes Relative: 13 %
Lymphs Abs: 1.2 10*3/uL (ref 0.7–4.0)
MCH: 30.3 pg (ref 26.0–34.0)
MCHC: 31.6 g/dL (ref 30.0–36.0)
MCV: 95.9 fL (ref 80.0–100.0)
Monocytes Absolute: 0.7 10*3/uL (ref 0.1–1.0)
Monocytes Relative: 7 %
Neutro Abs: 7.2 10*3/uL (ref 1.7–7.7)
Neutrophils Relative %: 77 %
Platelets: 92 10*3/uL — ABNORMAL LOW (ref 150–400)
RBC: 2.41 MIL/uL — ABNORMAL LOW (ref 3.87–5.11)
RDW: 17.6 % — ABNORMAL HIGH (ref 11.5–15.5)
WBC: 9.4 10*3/uL (ref 4.0–10.5)
nRBC: 0 % (ref 0.0–0.2)

## 2023-05-11 LAB — TYPE AND SCREEN
ABO/RH(D): A POS
Antibody Screen: NEGATIVE
Unit division: 0
Unit division: 0

## 2023-05-11 LAB — PREPARE RBC (CROSSMATCH)

## 2023-05-11 LAB — FERRITIN: Ferritin: 51 ng/mL (ref 11–307)

## 2023-05-11 LAB — COMPREHENSIVE METABOLIC PANEL
ALT: 12 U/L (ref 0–44)
AST: 28 U/L (ref 15–41)
Albumin: 3.4 g/dL — ABNORMAL LOW (ref 3.5–5.0)
Alkaline Phosphatase: 103 U/L (ref 38–126)
Anion gap: 10 (ref 5–15)
BUN: 105 mg/dL — ABNORMAL HIGH (ref 8–23)
CO2: 22 mmol/L (ref 22–32)
Calcium: 8.4 mg/dL — ABNORMAL LOW (ref 8.9–10.3)
Chloride: 101 mmol/L (ref 98–111)
Creatinine, Ser: 3.22 mg/dL — ABNORMAL HIGH (ref 0.44–1.00)
GFR, Estimated: 14 mL/min — ABNORMAL LOW (ref >60)
Glucose, Bld: 161 mg/dL — ABNORMAL HIGH (ref 70–99)
Potassium: 4.4 mmol/L (ref 3.5–5.1)
Sodium: 133 mmol/L — ABNORMAL LOW (ref 135–145)
Total Bilirubin: 0.5 mg/dL (ref 0.3–1.2)
Total Protein: 5.3 g/dL — ABNORMAL LOW (ref 6.5–8.1)

## 2023-05-11 LAB — IRON AND IRON BINDING CAPACITY (CC-WL,HP ONLY)
Iron: 21 ug/dL — ABNORMAL LOW (ref 28–170)
Saturation Ratios: 6 % — ABNORMAL LOW (ref 10.4–31.8)
TIBC: 351 ug/dL (ref 250–450)
UIBC: 330 ug/dL (ref 148–442)

## 2023-05-11 LAB — SAMPLE TO BLOOD BANK

## 2023-05-11 LAB — PROTIME-INR
INR: 3.3 — ABNORMAL HIGH (ref 0.8–1.2)
Prothrombin Time: 34.2 seconds — ABNORMAL HIGH (ref 11.4–15.2)

## 2023-05-11 MED ORDER — ACETAMINOPHEN 325 MG PO TABS
650.0000 mg | ORAL_TABLET | Freq: Once | ORAL | Status: AC
  Administered 2023-05-11: 650 mg via ORAL
  Filled 2023-05-11: qty 2

## 2023-05-11 MED ORDER — DIPHENHYDRAMINE HCL 25 MG PO CAPS
25.0000 mg | ORAL_CAPSULE | Freq: Once | ORAL | Status: AC
  Administered 2023-05-11: 25 mg via ORAL
  Filled 2023-05-11: qty 1

## 2023-05-11 MED ORDER — EPOETIN ALFA-EPBX 40000 UNIT/ML IJ SOLN
40000.0000 [IU] | Freq: Once | INTRAMUSCULAR | Status: AC
  Administered 2023-05-11: 40000 [IU] via SUBCUTANEOUS
  Filled 2023-05-11: qty 1

## 2023-05-11 MED ORDER — SODIUM CHLORIDE 0.9% IV SOLUTION
250.0000 mL | Freq: Once | INTRAVENOUS | Status: AC
  Administered 2023-05-11: 250 mL via INTRAVENOUS

## 2023-05-11 NOTE — Patient Instructions (Signed)

## 2023-05-12 ENCOUNTER — Other Ambulatory Visit (HOSPITAL_COMMUNITY): Payer: Self-pay | Admitting: Cardiology

## 2023-05-14 ENCOUNTER — Telehealth: Payer: Self-pay

## 2023-05-14 ENCOUNTER — Other Ambulatory Visit: Payer: Self-pay | Admitting: Physician Assistant

## 2023-05-14 NOTE — Telephone Encounter (Signed)
This nurse reached out to patient and spoke with [place], patients daughter.  Advised that patients lab results have returned and the provider feels that the patient is in need of IV iron.  Daughter states that they are in agreement.  She request for infusions to be scheduled at [facility] if possible due to patient living in [place].  This nurse advised that her request will be made aware to the provider. Also advised that someone will be giving her a call to get the infusions scheduled.  She request that they speak with her because the patient is forgetful, acknowledged understanding.  No further questions or concerns noted at this time.

## 2023-05-15 ENCOUNTER — Ambulatory Visit: Payer: Medicare HMO | Attending: Cardiology | Admitting: *Deleted

## 2023-05-15 DIAGNOSIS — Z5181 Encounter for therapeutic drug level monitoring: Secondary | ICD-10-CM | POA: Diagnosis not present

## 2023-05-15 DIAGNOSIS — Z952 Presence of prosthetic heart valve: Secondary | ICD-10-CM

## 2023-05-15 LAB — POCT INR: INR: 1.2 — AB (ref 2.0–3.0)

## 2023-05-15 NOTE — Patient Instructions (Signed)
Warfarin has been on HOLD since 8/2 per hemoc/cardio due to low Hgb  ? GI Bleed  Can restart warfarin on Friday night 05/18/23 Restart warfarin at 1 tablet (5mg  daily) Recheck in 2 wk

## 2023-05-17 ENCOUNTER — Other Ambulatory Visit (HOSPITAL_COMMUNITY): Payer: Self-pay | Admitting: *Deleted

## 2023-05-18 ENCOUNTER — Inpatient Hospital Stay: Payer: Medicare HMO

## 2023-05-18 ENCOUNTER — Other Ambulatory Visit: Payer: Self-pay

## 2023-05-18 VITALS — BP 147/57 | HR 46 | Temp 97.5°F | Resp 17

## 2023-05-18 DIAGNOSIS — E611 Iron deficiency: Secondary | ICD-10-CM | POA: Diagnosis not present

## 2023-05-18 DIAGNOSIS — D591 Autoimmune hemolytic anemia, unspecified: Secondary | ICD-10-CM | POA: Diagnosis not present

## 2023-05-18 DIAGNOSIS — M8580 Other specified disorders of bone density and structure, unspecified site: Secondary | ICD-10-CM | POA: Diagnosis not present

## 2023-05-18 DIAGNOSIS — D638 Anemia in other chronic diseases classified elsewhere: Secondary | ICD-10-CM

## 2023-05-18 DIAGNOSIS — I7782 Antineutrophilic cytoplasmic antibody (ANCA) vasculitis: Secondary | ICD-10-CM | POA: Diagnosis not present

## 2023-05-18 DIAGNOSIS — D500 Iron deficiency anemia secondary to blood loss (chronic): Secondary | ICD-10-CM

## 2023-05-18 DIAGNOSIS — D631 Anemia in chronic kidney disease: Secondary | ICD-10-CM | POA: Diagnosis not present

## 2023-05-18 DIAGNOSIS — N184 Chronic kidney disease, stage 4 (severe): Secondary | ICD-10-CM | POA: Diagnosis not present

## 2023-05-18 LAB — COMPREHENSIVE METABOLIC PANEL
ALT: 16 U/L (ref 0–44)
AST: 30 U/L (ref 15–41)
Albumin: 3.1 g/dL — ABNORMAL LOW (ref 3.5–5.0)
Alkaline Phosphatase: 97 U/L (ref 38–126)
Anion gap: 11 (ref 5–15)
BUN: 100 mg/dL — ABNORMAL HIGH (ref 8–23)
CO2: 18 mmol/L — ABNORMAL LOW (ref 22–32)
Calcium: 8.5 mg/dL — ABNORMAL LOW (ref 8.9–10.3)
Chloride: 102 mmol/L (ref 98–111)
Creatinine, Ser: 2.79 mg/dL — ABNORMAL HIGH (ref 0.44–1.00)
GFR, Estimated: 16 mL/min — ABNORMAL LOW (ref >60)
Glucose, Bld: 148 mg/dL — ABNORMAL HIGH (ref 70–99)
Potassium: 4 mmol/L (ref 3.5–5.1)
Sodium: 131 mmol/L — ABNORMAL LOW (ref 135–145)
Total Bilirubin: 0.6 mg/dL (ref 0.3–1.2)
Total Protein: 5.5 g/dL — ABNORMAL LOW (ref 6.5–8.1)

## 2023-05-18 LAB — CBC WITH DIFFERENTIAL/PLATELET
Abs Immature Granulocytes: 0.01 10*3/uL (ref 0.00–0.07)
Basophils Absolute: 0.1 10*3/uL (ref 0.0–0.1)
Basophils Relative: 1 %
Eosinophils Absolute: 0.3 10*3/uL (ref 0.0–0.5)
Eosinophils Relative: 5 %
HCT: 28.8 % — ABNORMAL LOW (ref 36.0–46.0)
Hemoglobin: 9.1 g/dL — ABNORMAL LOW (ref 12.0–15.0)
Immature Granulocytes: 0 %
Lymphocytes Relative: 20 %
Lymphs Abs: 1.4 10*3/uL (ref 0.7–4.0)
MCH: 30 pg (ref 26.0–34.0)
MCHC: 31.6 g/dL (ref 30.0–36.0)
MCV: 95 fL (ref 80.0–100.0)
Monocytes Absolute: 0.7 10*3/uL (ref 0.1–1.0)
Monocytes Relative: 10 %
Neutro Abs: 4.5 10*3/uL (ref 1.7–7.7)
Neutrophils Relative %: 64 %
Platelets: 110 10*3/uL — ABNORMAL LOW (ref 150–400)
RBC: 3.03 MIL/uL — ABNORMAL LOW (ref 3.87–5.11)
RDW: 20.5 % — ABNORMAL HIGH (ref 11.5–15.5)
WBC: 6.9 10*3/uL (ref 4.0–10.5)
nRBC: 0 % (ref 0.0–0.2)

## 2023-05-18 MED ORDER — ACETAMINOPHEN 325 MG PO TABS
650.0000 mg | ORAL_TABLET | Freq: Once | ORAL | Status: AC
  Administered 2023-05-18: 650 mg via ORAL
  Filled 2023-05-18: qty 2

## 2023-05-18 MED ORDER — SODIUM CHLORIDE 0.9 % IV SOLN
300.0000 mg | Freq: Once | INTRAVENOUS | Status: AC
  Administered 2023-05-18: 300 mg via INTRAVENOUS
  Filled 2023-05-18: qty 300

## 2023-05-18 MED ORDER — SODIUM CHLORIDE 0.9 % IV SOLN: Freq: Once | INTRAVENOUS | Status: AC

## 2023-05-18 MED ORDER — CETIRIZINE HCL 10 MG PO TABS
10.0000 mg | ORAL_TABLET | Freq: Once | ORAL | Status: AC
  Administered 2023-05-18: 10 mg via ORAL
  Filled 2023-05-18: qty 1

## 2023-05-18 NOTE — Patient Instructions (Signed)
[facility]  Discharge Instructions: Thank you for choosing [facility] to provide your oncology and hematology care.  If you have a lab appointment with the Cancer Center - please note that after April 8th, 2024, all labs will be drawn in the cancer center.  You do not have to check in or register with the main entrance as you have in the past but will complete your check-in in the cancer center.  Wear comfortable clothing and clothing appropriate for easy access to any Portacath or PICC line.   We strive to give you quality time with your provider. You may need to reschedule your appointment if you arrive late (15 or more minutes).  Arriving late affects you and other patients whose appointments are after yours.  Also, if you miss three or more appointments without notifying the office, you may be dismissed from the clinic at the provider's discretion.      For prescription refill requests, have your pharmacy contact our office and allow 72 hours for refills to be completed.    Today you received the following chemotherapy and/or immunotherapy agents Venofer 300 mg. Iron Sucrose Injection What is this medication? IRON SUCROSE (EYE ern SOO krose) treats low levels of iron (iron deficiency anemia) in people with kidney disease. Iron is a mineral that plays an important role in making red blood cells, which carry oxygen from your lungs to the rest of your body. This medicine may be used for other purposes; ask your health care provider or pharmacist if you have questions. COMMON BRAND NAME(S): Venofer What should I tell my care team before I take this medication? They need to know if you have any of these conditions: Anemia not caused by low iron levels Heart disease High levels of iron in the blood Kidney disease Liver disease An unusual or allergic reaction to iron, other medications, foods, dyes, or preservatives Pregnant or trying to get  pregnant Breastfeeding How should I use this medication? This medication is for infusion into a vein. It is given in a hospital or clinic setting. Talk to your care team about the use of this medication in children. While this medication may be prescribed for children as young as 2 years for selected conditions, precautions do apply. Overdosage: If you think you have taken too much of this medicine contact a poison control center or emergency room at once. NOTE: This medicine is only for you. Do not share this medicine with others. What if I miss a dose? Keep appointments for follow-up doses. It is important not to miss your dose. Call your care team if you are unable to keep an appointment. What may interact with this medication? Do not take this medication with any of the following: Deferoxamine Dimercaprol Other iron products This medication may also interact with the following: Chloramphenicol Deferasirox This list may not describe all possible interactions. Give your health care provider a list of all the medicines, herbs, non-prescription drugs, or dietary supplements you use. Also tell them if you smoke, drink alcohol, or use illegal drugs. Some items may interact with your medicine. What should I watch for while using this medication? Visit your care team regularly. Tell your care team if your symptoms do not start to get better or if they get worse. You may need blood work done while you are taking this medication. You may need to follow a special diet. Talk to your care team. Foods that contain iron include: whole  grains/cereals, dried fruits, beans, or peas, leafy green vegetables, and organ meats (liver, kidney). What side effects may I notice from receiving this medication? Side effects that you should report to your care team as soon as possible: Allergic reactions--skin rash, itching, hives, swelling of the face, lips, tongue, or throat Low blood pressure--dizziness, feeling  faint or lightheaded, blurry vision Shortness of breath Side effects that usually do not require medical attention (report to your care team if they continue or are bothersome): Flushing Headache Joint pain Muscle pain Nausea Pain, redness, or irritation at injection site This list may not describe all possible side effects. Call your doctor for medical advice about side effects. You may report side effects to FDA at 1-800-FDA-1088. Where should I keep my medication? This medication is given in a hospital or clinic. It will not be stored at home. NOTE: This sheet is a summary. It may not cover all possible information. If you have questions about this medicine, talk to your doctor, pharmacist, or health care provider.  2024 Elsevier/Gold Standard (2023-03-02 00:00:00)       To help prevent nausea and vomiting after your treatment, we encourage you to take your nausea medication as directed.  BELOW ARE SYMPTOMS THAT SHOULD BE REPORTED IMMEDIATELY: *FEVER GREATER THAN 100.4 F (38 C) OR HIGHER *CHILLS OR SWEATING *NAUSEA AND VOMITING THAT IS NOT CONTROLLED WITH YOUR NAUSEA MEDICATION *UNUSUAL SHORTNESS OF BREATH *UNUSUAL BRUISING OR BLEEDING *URINARY PROBLEMS (pain or burning when urinating, or frequent urination) *BOWEL PROBLEMS (unusual diarrhea, constipation, pain near the anus) TENDERNESS IN MOUTH AND THROAT WITH OR WITHOUT PRESENCE OF ULCERS (sore throat, sores in mouth, or a toothache) UNUSUAL RASH, SWELLING OR PAIN  UNUSUAL VAGINAL DISCHARGE OR ITCHING   Items with * indicate a potential emergency and should be followed up as soon as possible or go to the Emergency Department if any problems should occur.  Please show the CHEMOTHERAPY ALERT CARD or IMMUNOTHERAPY ALERT CARD at check-in to the Emergency Department and triage nurse.  Should you have questions after your visit or need to cancel or reschedule your appointment, please contact [facility]  [phone]  and follow the prompts.  Office hours are 8:00 a.m. to 4:30 p.m. Monday - Friday. Please note that voicemails left after 4:00 p.m. may not be returned until the following business day.  We are closed weekends and major holidays. You have access to a nurse at all times for urgent questions. Please call the main number to the clinic [phone] and follow the prompts.  For any non-urgent questions, you may also contact your provider using MyChart. We now offer e-Visits for anyone 60 and older to request care online for non-urgent symptoms. For details visit [email or web address].   Also download the MyChart app! Go to the app store, search "MyChart", open the app, select [facility], and log in with your MyChart username and password.

## 2023-05-18 NOTE — Progress Notes (Signed)
Patient presents today for Venofer 300 mg iron infusion. MAR reviewed and updated. Vital signs stable. Patient has no complaints of any side effects related to iron.   Venofer 300 mg given today per MD orders. Tolerated infusion without adverse affects. Vital signs stable. No complaints at this time. Discharged from clinic by wheel chair accompanied by daughter in stable condition. Alert and oriented x 3. F/U with [facility] as scheduled.

## 2023-05-21 ENCOUNTER — Encounter: Payer: Self-pay | Admitting: Hematology and Oncology

## 2023-05-21 ENCOUNTER — Other Ambulatory Visit: Payer: Self-pay | Admitting: *Deleted

## 2023-05-21 ENCOUNTER — Telehealth: Payer: Self-pay | Admitting: *Deleted

## 2023-05-21 DIAGNOSIS — D649 Anemia, unspecified: Secondary | ICD-10-CM

## 2023-05-21 DIAGNOSIS — D594 Other nonautoimmune hemolytic anemias: Secondary | ICD-10-CM

## 2023-05-21 DIAGNOSIS — D508 Other iron deficiency anemias: Secondary | ICD-10-CM

## 2023-05-21 NOTE — Telephone Encounter (Signed)
This RN spoke with pt's caregiver- [place] Sink - who called stating onset of "violent diarrhea the evening of her getting the IV iron"  Stool was not noted as bloody.  She states Ivania had episodes every 2 hours- with relief by early am.  Brandice then slept most of Saturday- woke up late afternoon "and felt good - had no further problems" She is currently staying at her daughter's and "she looks great - her color is improved and she has no complaints"  [place] S[place] is asking if the diarrhea is from the IV iron.  Note pt received 400 mg of venofer on 05/18/2023 at [facility].  Eja is scheduled for lab visit and injection this Friday at this office.  She is seeing a GI doctor tomorrow due to recent history of bloody stools.   This RN added a ferritin to lab orders for possible need for further iron replacement (it was 51 prior to this infusion)  No further needs at  this time.  This message will be forwarded to the provider for review for upcoming visit.

## 2023-05-21 NOTE — Progress Notes (Signed)
GI Office Note    Referring Provider: Heilingoetter, Software engineer* Primary Care Physician:  Gaspar Garbe, MD  Primary Gastroenterologist: Previously Dr. Marca Ancona  Chief Complaint   Chief Complaint  Patient presents with   New Patient (Initial Visit)    Pt referred for acute GI bleed. Pt has a lot of diarrhea and a lot of gas.     History of Present Illness   [name] is a 82 y.o. female presenting today at the request of Cassandra Heilingoetter, PA-C for acute GI bleeding, desiring to establish care in [place]. She has a complex medication history as outlined under PMH. She is chronically anticoagulated with Afib/mechanical aortic heart valve (on warfarin). She is followed by hematology for multifactorial anemia (anemia of chronic inflammation and renal insufficiency, IDA due to blood loss). Receives Retacrit injections every two weeks. Last blood transfusion 05/11/23 (2 units). H/O IV iron infusions.   Labs from May 18 2023: Sodium 131, BUN 100, creatinine 2.79, albumin 3.1, total bilirubin 0.6, alkaline phosphatase 97, AST 30, ALT 16, white blood cell count 6900, hemoglobin 9.1, hematocrit 28.8, MCV 95, platelets 110,000, INR 1.2. Labs from 05/11/23, ferritin 51, iron 21, iron sat 6%, TIBC 351  Recent admissions in March and April of this year.  Hospitalized in March with acute GI bleeding, sepsis, acute on chronic CHF.  In April hospitalized with acute on chronic kidney disease, acute hypoxic respiratory failure.  She was offered upper endoscopy in March 2024 during hospitalization but she declined.  Advised no NSAIDs or aspirin powders.  During March 2024 hospitalization CT showed mild diffuse colonic wall thickening, possible edema involving the second and third portions of duodenum but limited evaluation due to lack of IV contrast.  She declined endoscopic evaluation.  She was given empiric antibiotics, advised to continue PPI and avoid NSAIDs/aspirin powders.  Today:  she complains of severe constipation about six weeks ago. She has history of intermittent constipation in the past. She uses miralax daily as needed. If she does not have a BM for 1-2 days, then she takes a dose. She is no longer taking Senokot every night. Recently after bout of constipation, she noted dark blood in the stools. She has also had to call EMS to assist her due to uncontrollable bleeding at her [employer] site. Stools are black on iron. She noted explosive diarrhea every 2 hours last Friday night, but was fine on Saturday. She had received IV iron earlier that day. Every time she has a BM she passes some bloody mucous. She has had some hemorrhoid pain. Appetite good. No heartburn. Some nausea at times. Some mild vague abdominal pain.   She is schedule for IV iron on Friday. States she has had to have blood transfusion in the past.    She does not want to have a colonoscopy.  Wt Readings from Last 10 Encounters:  05/22/23 112 lb (50.8 kg)  05/11/23 115 lb 1.6 oz (52.2 kg)  05/08/23 113 lb (51.3 kg)  04/26/23 112 lb 11.2 oz (51.1 kg)  03/29/23 111 lb 11.2 oz (50.7 kg)  03/26/23 111 lb 3.2 oz (50.4 kg)  03/02/23 111 lb 8 oz (50.6 kg)  02/23/23 109 lb (49.4 kg)  02/02/23 105 lb 1.6 oz (47.7 kg)  01/13/23 123 lb 7.3 oz (56 kg)   CT A/P without contrast 01/2023: IMPRESSION: -There is no evidence of intestinal obstruction or pneumoperitoneum. -There is no hydronephrosis. -Diverticulosis of colon without signs of focal diverticulitis. -Minimal ascites.  -  Cystocele. -Small to moderate bilateral pleural effusions.  -Severe arteriosclerosis. Coronary artery disease.  -Lumbar spondylosis. Degenerative changes are noted in right hip.  EGD 07/2022 (Dr. Marca Ancona): -esophageal plaques c/w candidiasis -erosive gastropathy with no bleeding and no stigmata of recent bleeding -non-bleeding duodenal diverticulum -mucosal changes in duodenum  Colonoscopy 01/2018 Charlott Rakes): diverticulosis,  internal hemorrhoids  [place] Capsule Study 11/2018 (Dr. Marca Ancona): complete study, duodenal diverticulum, no other abnormalities  Medications   Current Outpatient Medications  Medication Sig Dispense Refill   acetaminophen (TYLENOL) 325 MG tablet Take 325 mg by mouth every 6 (six) hours as needed for mild pain or fever.     allopurinol (ZYLOPRIM) 100 MG tablet Take 100 mg by mouth daily.     ALPRAZolam (XANAX) 0.5 MG tablet Take 0.5 mg by mouth daily.     amLODipine (NORVASC) 10 MG tablet Take 1 tablet (10 mg total) by mouth daily. 90 tablet 3   atorvastatin (LIPITOR) 80 MG tablet Take 1 tablet (80 mg total) by mouth daily at 6 PM.     budesonide (PULMICORT) 0.5 MG/2ML nebulizer solution Take 2 mLs (0.5 mg total) by nebulization 2 (two) times daily. 120 mL 1   Cholecalciferol (VITAMIN D3) 50 MCG (2000 UT) capsule Take 2,000 Units by mouth daily with lunch.     cloNIDine (CATAPRES) 0.2 MG tablet Take 0.2 mg by mouth 2 (two) times daily.     Cyanocobalamin (VITAMIN B12) 1000 MCG TBCR Take 1,000 mcg by mouth daily with lunch.     famotidine (PEPCID) 20 MG tablet Take 20 mg by mouth 2 (two) times daily.     ferrous sulfate 325 (65 FE) MG tablet Take 1 tablet (325 mg total) by mouth daily with breakfast. (Patient taking differently: Patient takes 2 tablets by mouth daily.)  3   furosemide (LASIX) 40 MG tablet Patient takes 2 tablets by mouth in the morning and 1.5 tablets at night.     hydrALAZINE (APRESOLINE) 100 MG tablet Take 1 tablet (100 mg total) by mouth 3 (three) times daily. 270 tablet 3   hydrocortisone (ANUSOL-HC) 2.5 % rectal cream Place 1 Application rectally 2 (two) times daily. For 14 days. May repeat if needed. 30 g 1   insulin aspart (NOVOLOG) 100 UNIT/ML injection Inject 2-8 Units into the skin 3 (three) times daily with meals. Per sliding scale, If blood sugar is 200 to 250, give 2 units. If blood sugar is 251 to 300, give 4 units. If blood sugar is 301 to 350, give 6 units. If  blood sugar is greater than 350, give 8 units and call MD.     Insulin Syringe-Needle U-100 (B-D INS SYR ULTRAFINE .3CC/29G) 29G X 1/2" 0.3 ML MISC Use for sliding scale for breakthrough high blood sugar readings 100 each 1   ipratropium-albuterol (DUONEB) 0.5-2.5 (3) MG/3ML SOLN Take 3 mLs by nebulization every 6 (six) hours as needed. 360 mL 0   metoprolol succinate (TOPROL-XL) 25 MG 24 hr tablet Take 1 tablet (25 mg total) by mouth daily. 30 tablet 3   ondansetron (ZOFRAN-ODT) 4 MG disintegrating tablet Take 4 mg by mouth every 8 (eight) hours as needed for nausea or vomiting.     pantoprazole (PROTONIX) 40 MG tablet Take 1 tablet (40 mg total) by mouth 2 (two) times daily. 60 tablet 2   senna-docusate (SENOKOT-S) 8.6-50 MG tablet Take 2 tablets by mouth at bedtime. 60 tablet 2   sertraline (ZOLOFT) 25 MG tablet Take 25 mg by mouth daily.  spironolactone (ALDACTONE) 25 MG tablet TAKE 1 TABLET EVERY DAY 90 tablet 3   tadalafil, PAH, (ADCIRCA) 20 MG tablet TAKE 2 TABLETS BY MOUTH ONCE DAILY 60 tablet 11   warfarin (COUMADIN) 5 MG tablet Take 1 tablet (5 mg total) by mouth See admin instructions. Take 1 tablet 5mg  on Tuesday/Thursday/Sunday and 1/2 Tab (2.5 mg) on MWFSat (Patient taking differently: Take 5 mg by mouth See admin instructions. Take 5 mg once daily. Pt's has not been checked in over a week. Per daughter)     simethicone (GAS-X) 80 MG chewable tablet Chew 1 tablet (80 mg total) by mouth 4 (four) times daily as needed for flatulence. 100 tablet 2   No current facility-administered medications for this visit.    Allergies   Allergies as of 05/22/2023 - Review Complete 05/22/2023  Allergen Reaction Noted   Augmentin [amoxicillin-pot clavulanate] Diarrhea and Nausea And Vomiting 04/18/2022   Ciprofloxacin Diarrhea, Nausea Only, and Other (See Comments) 05/13/2021   Codeine  10/27/2022   Coreg [carvedilol] Other (See Comments) 01/18/2018   Diflucan [fluconazole] Diarrhea 02/04/2022    Levofloxacin in d5w  11/10/2022   Losartan Swelling and Other (See Comments) 08/22/2013   Privigen [immune globulin (human)] Other (See Comments) 02/07/2022   Sulfamethoxazole-trimethoprim Diarrhea, Nausea Only, and Other (See Comments) 05/13/2021   Verapamil Hives 08/22/2013   Zetia [ezetimibe] Other (See Comments) 10/06/2011   Zocor [simvastatin - high dose] Other (See Comments) 10/06/2011   Gatifloxacin Rash and Other (See Comments) 03/30/2020    Past Medical History   Past Medical History:  Diagnosis Date   Aortic atherosclerosis (HCC) 01/23/2018   Atrial fibrillation (HCC)    Benign positional vertigo 10/06/2011   Carotid artery disease (HCC)    Chemotherapy induced neutropenia (HCC) 07/21/2019   Cholelithiasis 01/23/2018   Cholelithiasis 01/23/2018   Chronic anticoagulation    Colovesical fistula, ruled out 04/14/2022   Coronary artery disease    status post coronary artery bypass grafting times 07/10/2004   Diabetes mellitus    Hypercholesteremia    Hypertension    Mechanical heart valve present    H. aortic valve replacement at the time of bypass surgery October 2005   Moderate to severe pulmonary hypertension (HCC)    Peripheral arterial disease (HCC)    history of left common iliac artery PTA and stenting for a chronic total occlusion 08/26/01   S/P cholecystectomy 03/06/2018    Past Surgical History   Past Surgical History:  Procedure Laterality Date   anterior repair  2009   AORTIC VALVE REPLACEMENT  2005   Dr. Laneta Simmers   CARDIAC CATHETERIZATION  11/10/2004   40% right common illiac, 70% in stent restenosis of distal left common illiac,    CARDIAC CATHETERIZATION  05/18/2004   LAD 50-70% midstenosis, RCA dominant w/50% stenosis, 50% Right common Illiac artery ostial stenosis, 90% in stent restenosis within midportion of left common illiac stent   Carotid Duplex  03/12/2012   RSA-elev. velocities suggestive of a 50-69% diameter reduction, Right&Left Bulb/Prox  ICA-mild-mod.fibrous plaqueelevating Velocities abnormal study.   CHOLECYSTECTOMY N/A 03/01/2018   Procedure: LAPAROSCOPIC CHOLECYSTECTOMY;  Surgeon: Jimmye Norman, MD;  Location: [facility] OR;  Service: General;  Laterality: N/A;   COLONOSCOPY WITH PROPOFOL N/A 01/22/2018   Procedure: COLONOSCOPY WITH PROPOFOL;  Surgeon: Charlott Rakes, MD;  Location: [facility] ENDOSCOPY;  Service: Endoscopy;  Laterality: N/A;   ESOPHAGOGASTRODUODENOSCOPY N/A 07/13/2022   Procedure: ESOPHAGOGASTRODUODENOSCOPY (EGD);  Surgeon: Kerin Salen, MD;  Location: [facility] ENDOSCOPY;  Service: Gastroenterology;  Laterality: N/A;  ESOPHAGOGASTRODUODENOSCOPY (EGD) WITH PROPOFOL N/A 01/22/2018   Procedure: ESOPHAGOGASTRODUODENOSCOPY (EGD) WITH PROPOFOL;  Surgeon: Charlott Rakes, MD;  Location: [facility] ENDOSCOPY;  Service: Endoscopy;  Laterality: N/A;   ESOPHAGOGASTRODUODENOSCOPY (EGD) WITH PROPOFOL N/A 11/21/2018   Procedure: ESOPHAGOGASTRODUODENOSCOPY (EGD) WITH PROPOFOL;  Surgeon: Kerin Salen, MD;  Location: [facility] ENDOSCOPY;  Service: Gastroenterology;  Laterality: N/A;   ESOPHAGOGASTRODUODENOSCOPY (EGD) WITH PROPOFOL Left 02/27/2019   Procedure: ESOPHAGOGASTRODUODENOSCOPY (EGD) WITH PROPOFOL;  Surgeon: Willis Modena, MD;  Location: [facility] ENDOSCOPY;  Service: Endoscopy;  Laterality: Left;   GIVENS CAPSULE STUDY N/A 11/21/2018   Procedure: GIVENS CAPSULE STUDY;  Surgeon: Kerin Salen, MD;  Location: [facility] ENDOSCOPY;  Service: Gastroenterology;  Laterality: N/A;  To be deployed during EGD   Lower Ext. Duplex  03/12/2012   Right Proximal CIA- vessel narrowing w/elevated velocities 0-49% diameter reduction. Right SFA-mild mixed density plaque throughout vessel.   NM MYOCAR PERF WALL MOTION  05/19/2010   protocol: Persantine, post stress EF 65%, negative for ischemia, low risk scan   RIGHT HEART CATH N/A 06/27/2018   Procedure: RIGHT HEART CATH;  Surgeon: Laurey Morale, MD;  Location: [facility] INVASIVE CV LAB;  Service: Cardiovascular;  Laterality: N/A;   TOTAL  ABDOMINAL HYSTERECTOMY W/ BILATERAL SALPINGOOPHORECTOMY  1989   TRANSTHORACIC ECHOCARDIOGRAM  08/29/2012   Moderately calcified annulus of mitral valve, moderate regurg. of both mitral valve and tricuspid valve.     Past Family History   Family History  Problem Relation Age of Onset   Heart disease Father    Hypertension Father    Hyperlipidemia Father    Breast cancer Neg Hx     Past Social History   Social History   Socioeconomic History   Marital status: Widowed    Spouse name: Not on file   Number of children: Not on file   Years of education: Not on file   Highest education level: Not on file  Occupational History   Occupation: Retired  Tobacco Use   Smoking status: Former    Current packs/day: 1.00    Average packs/day: 1 pack/day for 30.0 years (30.0 ttl pk-yrs)    Types: Cigarettes   Smokeless tobacco: Never   Tobacco comments:    quit smoking 2005  Vaping Use   Vaping status: Never Used  Substance and Sexual Activity   Alcohol use: No    Alcohol/week: 0.0 standard drinks of alcohol   Drug use: No   Sexual activity: Not Currently    Birth control/protection: None  Other Topics Concern   Not on file  Social History Narrative   Not on file   Social Determinants of Health   Financial Resource Strain: Low Risk  (02/28/2022)   Overall Financial Resource Strain (CARDIA)    Difficulty of Paying Living Expenses: Not hard at all  Food Insecurity: No Food Insecurity (12/25/2022)   Hunger Vital Sign    Worried About Running Out of Food in the Last Year: Never true    Ran Out of Food in the Last Year: Never true  Transportation Needs: No Transportation Needs (12/25/2022)   PRAPARE - Administrator, Civil Service (Medical): No    Lack of Transportation (Non-Medical): No  Physical Activity: Inactive (02/28/2022)   Exercise Vital Sign    Days of Exercise per Week: 0 days    Minutes of Exercise per Session: 0 min  Stress: No Stress Concern Present  (02/28/2022)   [employer] of Occupational Health - Occupational Stress Questionnaire    Feeling of  Stress : Only a little  Social Connections: Moderately Integrated (06/22/2022)   Social Connection and Isolation Panel [NHANES]    Frequency of Communication with Friends and Family: More than three times a week    Frequency of Social Gatherings with Friends and Family: More than three times a week    Attends Religious Services: More than 4 times per year    Active Member of [employer] or Organizations: Yes    Attends Banker Meetings: More than 4 times per year    Marital Status: Widowed  Intimate Partner Violence: Not At Risk (12/25/2022)   Humiliation, Afraid, Rape, and Kick questionnaire    Fear of Current or Ex-Partner: No    Emotionally Abused: No    Physically Abused: No    Sexually Abused: No    Review of Systems   General: Negative for anorexia, weight loss, fever, chills, fatigue, +weakness. Eyes: Negative for vision changes.  ENT: Negative for hoarseness, difficulty swallowing , nasal congestion. CV: Negative for chest pain, angina, palpitations, dyspnea on exertion, peripheral edema.  Respiratory: Negative for dyspnea at rest, dyspnea on exertion, cough, sputum, wheezing.  GI: See history of present illness. GU:  Negative for dysuria, hematuria, urinary incontinence, urinary frequency, nocturnal urination.  MS: Negative for joint pain, low back pain.  Derm: Negative for rash or itching.  Neuro: Negative for weakness, abnormal sensation, seizure, frequent headaches, memory loss,  confusion.  Psych: Negative for anxiety, depression, suicidal ideation, hallucinations.  Endo: Negative for unusual weight change.  Heme: Negative for bruising or bleeding. Allergy: Negative for rash or hives.  Physical Exam   BP (!) 132/56 (BP Location: Left Arm, Patient Position: Sitting, Cuff Size: Normal)   Pulse (!) 56   Temp (!) 97.4 F (36.3 C) (Temporal)   Ht 4\' 10"   (1.473 m)   Wt 112 lb (50.8 kg)   SpO2 99%   BMI 23.41 kg/m    General: frail appearing female in NAD. Accompanied by dgt.  Head: Normocephalic, atraumatic.   Eyes: Conjunctiva pale, no icterus. Mouth: Oropharyngeal mucosa moist and pink , no lesions erythema or exudate. Neck: Supple without thyromegaly, masses, or lymphadenopathy.  Lungs: Clear to auscultation bilaterally.  Heart: Regular rate and rhythm, no murmurs rubs or gallops. Audible click Abdomen: Bowel sounds are normal, nontender, nondistended, no hepatosplenomegaly or masses,  no abdominal bruits or hernia, no rebound or guarding.   Rectal: old brown blood noted in her protective pad. No external lesions. Mildly tender DRE, posterior aspect with likely palpable hemorrhoid, gross old brown blood on exam finger, Heme + Extremities: No lower extremity edema. No clubbing or deformities.  Neuro: Alert and oriented x 4 , grossly normal neurologically.  Skin: Warm and dry, no rash or jaundice.   Psych: Alert and cooperative, normal mood and affect.  Labs   Lab Results  Component Value Date   NA 131 (L) 05/18/2023   CL 102 05/18/2023   K 4.0 05/18/2023   CO2 18 (L) 05/18/2023   BUN 100 (H) 05/18/2023   CREATININE 2.79 (H) 05/18/2023   GFRNONAA 16 (L) 05/18/2023   CALCIUM 8.5 (L) 05/18/2023   PHOS 3.8 01/17/2023   ALBUMIN 3.1 (L) 05/18/2023   GLUCOSE 148 (H) 05/18/2023   Lab Results  Component Value Date   ALT 16 05/18/2023   AST 30 05/18/2023   ALKPHOS 97 05/18/2023   BILITOT 0.6 05/18/2023   Lab Results  Component Value Date   WBC 6.9 05/18/2023   HGB 9.1 (  L) 05/18/2023   HCT 28.8 (L) 05/18/2023   MCV 95.0 05/18/2023   PLT 110 (L) 05/18/2023   Lab Results  Component Value Date   IRON 21 (L) 05/11/2023   TIBC 351 05/11/2023   FERRITIN 51 05/11/2023   Lab Results  Component Value Date   VITAMINB12 1,584 (H) 02/04/2022   Lab Results  Component Value Date   FOLATE >40.0 02/04/2022    Imaging Studies    No results found.  Assessment   *IDA *Rectal bleeding  Acute on chronic IDA, recently requiring blood transfusions, IV iron, ongoing Retacrit injections. Anemia multifactorial but also with clinical concern for GI bleeding contributing in the setting of anticoagulation.   Stools black on iron. Recent episode of significant constipation, rectal pain and now with several weeks of intermittent rectal bleeding, dark blood. On exam, she had dark old bloody appearing mucous, heme positive, suspected palpable hemorrhoid. Suspect gross blood per rectum, hemorrhoid related. Cannot rule out ulceration, colitis/proctitis, inflammatory bowel disease, malignancy without colonoscopy. She is not interested in pursuing a colonoscopy. We discussed possibility of consider EGD given transfusion dependent anemia, black stools (could be iron) but she is not interested at this time, but would be willing to discuss with her family further and let us know if she wants to pursue.   Constipation will need to be addressed, avoiding hard stools/straining.    PLAN    Anusol cream apply anorectally BID for 2 weeks. Pt to call if ongoing rectal bleeding.  Avoid straining and constipation. Use senna-docusate two tablets at bedtime as prescribed. Use miralax daily as needed.  Continue pantoprazole 40mg  BID.  Avoid NSAIDs. Tight control of INR. Call if she decides to pursue EGD or colonoscopy. Otherwise she will continue to follow with hematology for anemia and return here as needed.   Leanna Battles. Melvyn Neth, MHS, PA-C [facility]

## 2023-05-22 ENCOUNTER — Ambulatory Visit: Payer: Medicare HMO | Admitting: Gastroenterology

## 2023-05-22 ENCOUNTER — Encounter: Payer: Self-pay | Admitting: Gastroenterology

## 2023-05-22 VITALS — BP 132/56 | HR 56 | Temp 97.4°F | Ht <= 58 in | Wt 112.0 lb

## 2023-05-22 DIAGNOSIS — K922 Gastrointestinal hemorrhage, unspecified: Secondary | ICD-10-CM

## 2023-05-22 DIAGNOSIS — Z515 Encounter for palliative care: Secondary | ICD-10-CM | POA: Diagnosis not present

## 2023-05-22 DIAGNOSIS — I5032 Chronic diastolic (congestive) heart failure: Secondary | ICD-10-CM | POA: Diagnosis not present

## 2023-05-22 MED ORDER — HYDROCORTISONE (PERIANAL) 2.5 % EX CREA: 1.0000 | TOPICAL_CREAM | Freq: Two times a day (BID) | CUTANEOUS | 1 refills | Status: DC

## 2023-05-22 NOTE — Patient Instructions (Signed)
Please let me know if you decide to pursue upper endoscopy.  Please continue to follow with hematology for anemia. Use anusol cream, insert inside rectum twice daily for two weeks. May repeat if needed. Please let me now if ongoing blood in the stool. Avoid straining and constipation. Use senna-docusate two tablets at bedtime as prescribed. Use miralax daily as needed.  Continue pantoprazole 40mg  twice daily.  I will let you know if Dr. Marletta Lor has any additional recommendations.

## 2023-05-25 ENCOUNTER — Other Ambulatory Visit: Payer: Self-pay

## 2023-05-25 ENCOUNTER — Encounter: Payer: Self-pay | Admitting: Adult Health

## 2023-05-25 ENCOUNTER — Inpatient Hospital Stay: Payer: Medicare HMO

## 2023-05-25 ENCOUNTER — Inpatient Hospital Stay: Payer: Medicare HMO | Admitting: Adult Health

## 2023-05-25 VITALS — BP 113/67 | HR 52 | Temp 97.3°F | Resp 18 | Ht <= 58 in | Wt 112.2 lb

## 2023-05-25 DIAGNOSIS — I7782 Antineutrophilic cytoplasmic antibody (ANCA) vasculitis: Secondary | ICD-10-CM | POA: Diagnosis not present

## 2023-05-25 DIAGNOSIS — D591 Autoimmune hemolytic anemia, unspecified: Secondary | ICD-10-CM | POA: Diagnosis not present

## 2023-05-25 DIAGNOSIS — D638 Anemia in other chronic diseases classified elsewhere: Secondary | ICD-10-CM

## 2023-05-25 DIAGNOSIS — D649 Anemia, unspecified: Secondary | ICD-10-CM | POA: Diagnosis not present

## 2023-05-25 DIAGNOSIS — D508 Other iron deficiency anemias: Secondary | ICD-10-CM

## 2023-05-25 DIAGNOSIS — D594 Other nonautoimmune hemolytic anemias: Secondary | ICD-10-CM

## 2023-05-25 DIAGNOSIS — D631 Anemia in chronic kidney disease: Secondary | ICD-10-CM | POA: Diagnosis not present

## 2023-05-25 DIAGNOSIS — D500 Iron deficiency anemia secondary to blood loss (chronic): Secondary | ICD-10-CM

## 2023-05-25 DIAGNOSIS — M8580 Other specified disorders of bone density and structure, unspecified site: Secondary | ICD-10-CM | POA: Diagnosis not present

## 2023-05-25 DIAGNOSIS — E611 Iron deficiency: Secondary | ICD-10-CM | POA: Diagnosis not present

## 2023-05-25 DIAGNOSIS — N184 Chronic kidney disease, stage 4 (severe): Secondary | ICD-10-CM | POA: Diagnosis not present

## 2023-05-25 LAB — RETICULOCYTES
Immature Retic Fract: 9 % (ref 2.3–15.9)
RBC.: 2.87 MIL/uL — ABNORMAL LOW (ref 3.87–5.11)
Retic Count, Absolute: 72.9 10*3/uL (ref 19.0–186.0)
Retic Ct Pct: 2.5 % (ref 0.4–3.1)

## 2023-05-25 LAB — COMPREHENSIVE METABOLIC PANEL
ALT: 14 U/L (ref 0–44)
AST: 32 U/L (ref 15–41)
Albumin: 3.5 g/dL (ref 3.5–5.0)
Alkaline Phosphatase: 117 U/L (ref 38–126)
Anion gap: 10 (ref 5–15)
BUN: 99 mg/dL — ABNORMAL HIGH (ref 8–23)
CO2: 23 mmol/L (ref 22–32)
Calcium: 8 mg/dL — ABNORMAL LOW (ref 8.9–10.3)
Chloride: 101 mmol/L (ref 98–111)
Creatinine, Ser: 3.34 mg/dL — ABNORMAL HIGH (ref 0.44–1.00)
GFR, Estimated: 13 mL/min — ABNORMAL LOW (ref >60)
Glucose, Bld: 165 mg/dL — ABNORMAL HIGH (ref 70–99)
Potassium: 4.8 mmol/L (ref 3.5–5.1)
Sodium: 134 mmol/L — ABNORMAL LOW (ref 135–145)
Total Bilirubin: 0.4 mg/dL (ref 0.3–1.2)
Total Protein: 5.7 g/dL — ABNORMAL LOW (ref 6.5–8.1)

## 2023-05-25 LAB — CBC WITH DIFFERENTIAL/PLATELET
Abs Immature Granulocytes: 0.01 10*3/uL (ref 0.00–0.07)
Basophils Absolute: 0 10*3/uL (ref 0.0–0.1)
Basophils Relative: 1 %
Eosinophils Absolute: 0.4 10*3/uL (ref 0.0–0.5)
Eosinophils Relative: 8 %
HCT: 28.1 % — ABNORMAL LOW (ref 36.0–46.0)
Hemoglobin: 8.9 g/dL — ABNORMAL LOW (ref 12.0–15.0)
Immature Granulocytes: 0 %
Lymphocytes Relative: 19 %
Lymphs Abs: 1 10*3/uL (ref 0.7–4.0)
MCH: 30.6 pg (ref 26.0–34.0)
MCHC: 31.7 g/dL (ref 30.0–36.0)
MCV: 96.6 fL (ref 80.0–100.0)
Monocytes Absolute: 0.5 10*3/uL (ref 0.1–1.0)
Monocytes Relative: 10 %
Neutro Abs: 3.2 10*3/uL (ref 1.7–7.7)
Neutrophils Relative %: 62 %
Platelets: 93 10*3/uL — ABNORMAL LOW (ref 150–400)
RBC: 2.91 MIL/uL — ABNORMAL LOW (ref 3.87–5.11)
RDW: 19.8 % — ABNORMAL HIGH (ref 11.5–15.5)
WBC: 5.2 10*3/uL (ref 4.0–10.5)
nRBC: 0 % (ref 0.0–0.2)

## 2023-05-25 LAB — FERRITIN: Ferritin: 127 ng/mL (ref 11–307)

## 2023-05-25 LAB — SAMPLE TO BLOOD BANK

## 2023-05-25 MED ORDER — EPOETIN ALFA-EPBX 40000 UNIT/ML IJ SOLN
40000.0000 [IU] | Freq: Once | INTRAMUSCULAR | Status: AC
  Administered 2023-05-25: 40000 [IU] via SUBCUTANEOUS
  Filled 2023-05-25: qty 1

## 2023-05-25 NOTE — Progress Notes (Signed)
[facility] Cancer Follow up:    [name], [name] Koh, MD [address] [place] [address]   DIAGNOSIS:  with multifactorial anemia, as follows:             (a) anemia of chronic inflammation: As of May 2020 she has a SED rate of 95, CRP 1.1, positive ANA and RF; subsequently she was found to be ANCA positive             (b) hemolysis (mild): she has a mildly elevated LDH and a DAT positive for complement, not IgG; this is c/w (a) above             (c) anemia of renal insufficiency: inadequate EPO resonse to anemia                         (d) GIB/ iron deficiency in patient on lifelong anticoagulaton   (1) Feraheme received 03/01/2019, repeated 03/20/2019              (a) reticulocyte 03/07/2019 up from 43.4 a year ago to 191.8 after iron infusion             (b) feraheme repeated on 05/12/2019             (c) subsequent ferritin levels >100             (D) Ferrliceit 250 mg last given 06/25/22   (2) darbepoetin: starting 05/05/2019 at given every 2 weeks             (a) dose increased to 200 mcg every week starting 07/01/2019              (b) back to every 2 weeks beginning 08/04/2019 still at 200 mcg dose             (c) switched to Retacrit every 4 weeks as of 08/18/2019   (3) ANCA positive vasculitis: diagnosed 05/08/2019 (titer 1:640)             (a) prednisone 40 mg daily started 05/31/2019             (b) rituximab weekly started 06/12/2019 (received test dose 06/05/2019)                         (i) hepatitis B sAg, core Ab and HIV negative 05/08/2019                         (ii) rituximab held after 07/14/2019 dose (received 5 weekly doses) with neutropenia                         (iii) rituximab resumed 08/18/2019 but changed to monthly                         (iv) rituximab changed to every 2 months beginning with 02/02/2020 dose                         (v) rituximab changed to every 12 weeks after the 10/12/2020 dose             (c) prednisone tapered  to off as of 09/15/2019                          (4) Anemia  worsened in spring 2023, refractory to Rituximab and requiring prednisone restart with slow taper. This was completed in March 2024 per chart review.    (5) osteopenia with a T score of -1.5 on bone density 02/28/2016 at the [facility] center   CURRENT THERAPY: Retacrit every 2 weeks  INTERVAL HISTORY: [name] 82 y.o. female returns for follow-up and evaluation of her multifactorial anemia.  She received IV iron on August 9 that caused severe diarrhea.  She does not want to receive any further IV iron.  She has been taking oral.  She is also taking Coumadin.  Her Coumadin level has continued to fluctuate and on August 2 her blood sugar monitor on her arm bled through gauze and Band-Aid and EMS had to remove the [place] and due to pressure bandage.  [name] is feeling moderately well.  She denies any significant issues currently.  She is mildly fatigued.   Patient Active Problem List   Diagnosis Date Noted   Chronic cough 03/26/2023   Protein-calorie malnutrition, severe 12/26/2022   Acute GI bleeding 12/24/2022   Coagulopathy (HCC) 12/24/2022   Melena 12/24/2022   Duodenitis 12/24/2022   Noninfectious gastroenteritis 12/24/2022   CAD, multiple vessel 12/24/2022   Acute on chronic heart failure with preserved ejection fraction (HFpEF) (HCC) 08/29/2022   Acute exacerbation of CHF (congestive heart failure) (HCC) 08/29/2022   Acute urinary retention 08/28/2022   Acute kidney injury superimposed on chronic kidney disease (HCC) 08/27/2022   Constipation 08/27/2022   Nausea and vomiting 08/27/2022   Hypoalbuminemia due to protein-calorie malnutrition (HCC) 08/27/2022   Type 2 diabetes mellitus with hyperglycemia (HCC) 08/27/2022   Palliative care encounter    Goals of care, counseling/discussion    Encounter for therapeutic drug monitoring    GI bleed 07/09/2022   GI bleeding 07/08/2022   Acute on chronic anemia 07/08/2022    History of Clostridioides difficile colitis 07/08/2022   DNR (do not resuscitate)/DNI(Do Not Intubate) 07/08/2022   Anemia of chronic disease 07/07/2022   Pressure injury of skin 06/23/2022   UTI (urinary tract infection) 06/22/2022   Joint pain 06/09/2022   Parietoalveolar pneumopathy (HCC) 06/09/2022   Primary osteoarthritis 06/09/2022   Rheumatoid factor positive 06/09/2022   C. difficile colitis 04/19/2022   Colitis 04/18/2022   PAH (pulmonary artery hypertension) (HCC) 04/18/2022   Emphysematous cystitis 04/13/2022   Female bladder prolapse 04/13/2022   Diverticulitis 04/12/2022   Autoimmune hemolytic anemia (HCC) 01/02/2022   Bilateral lower extremity edema 01/02/2022   Refractory anemia (HCC) 12/15/2021   Abnormal chest x-ray 12/15/2021   Supratherapeutic INR 12/15/2021   Elevated troponin 12/15/2021   Unilateral primary osteoarthritis, right hip 05/31/2021   Midline cystocele 05/30/2021   Primary localized osteoarthritis of pelvic region and thigh 05/30/2021   Vaginal pain 05/30/2021   Asymmetric SNHL (sensorineural hearing loss) 01/06/2021   Referred otalgia of both ears 01/06/2021   Bilateral hearing loss 08/31/2020   Low back pain 02/20/2020   CKD (chronic kidney disease), stage IV (HCC) 10/13/2019   Leukopenia due to antineoplastic chemotherapy (HCC) 08/18/2019   Thrombophilia (HCC) 06/19/2019   Encounter for general adult medical examination without abnormal findings 06/13/2019   Arteritis (HCC) 06/06/2019   Hypertensive heart and chronic kidney disease with heart failure and stage 1 through stage 4 chronic kidney disease, or unspecified chronic kidney disease (HCC) 06/06/2019   Muscle weakness 06/06/2019   Pancytopenia (HCC) 05/28/2019   Dyspnea 05/28/2019   Vasculitis, ANCA positive 05/28/2019   Disorder of connective tissue (  HCC) 05/14/2019   Renal insufficiency 05/08/2019   PAF (paroxysmal atrial fibrillation) (HCC) 04/07/2019   Iron deficiency anemia  02/28/2019   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 (HCC) 02/28/2019   Diabetic retinopathy associated with type 2 diabetes mellitus (HCC) 09/02/2018   Chronic pain 06/19/2018   Non-thrombocytopenic purpura (HCC) 05/21/2018   Gout 03/22/2018   Malnutrition of moderate degree 03/12/2018   Diabetes mellitus type 2, controlled (HCC) 03/06/2018   Chronic atrial fibrillation (HCC) 03/06/2018   H/O mechanical aortic valve replacement 03/06/2018   Moderate to severe pulmonary hypertension (HCC) 03/06/2018   GERD without esophagitis 02/21/2018   Anxiety 02/21/2018   Chronic diastolic CHF (congestive heart failure) (HCC) 02/21/2018   General weakness 02/02/2018   Thrombocytopenia (HCC) 01/25/2018   Arm pain, diffuse 01/25/2018   Anemia 01/23/2018   Aortic atherosclerosis (HCC) 01/23/2018   Age-related osteoporosis without current pathological fracture 05/24/2017   Carotid artery occlusion 05/24/2017   Generalized anxiety disorder 05/24/2017   Hearing loss 05/24/2017   Presence of prosthetic heart valve 05/24/2017   Carotid artery disease (HCC) 08/18/2013   Peripheral arterial disease (HCC) 08/18/2013   Diabetes mellitus without complication (HCC) 08/18/2013   Essential hypertension    Hypercholesteremia    Mechanical heart valve present    Chronic anticoagulation     is allergic to augmentin [amoxicillin-pot clavulanate], ciprofloxacin, codeine, coreg [carvedilol], diflucan [fluconazole], levofloxacin in d5w, losartan, privigen [immune globulin (human)], sulfamethoxazole-trimethoprim, verapamil, zetia [ezetimibe], zocor [simvastatin - high dose], and gatifloxacin.  MEDICAL HISTORY: Past Medical History:  Diagnosis Date   Aortic atherosclerosis (HCC) 01/23/2018   Atrial fibrillation (HCC)    Benign positional vertigo 10/06/2011   Carotid artery disease (HCC)    Chemotherapy induced neutropenia (HCC) 07/21/2019   Cholelithiasis 01/23/2018   Cholelithiasis  01/23/2018   Chronic anticoagulation    Colovesical fistula, ruled out 04/14/2022   Coronary artery disease    status post coronary artery bypass grafting times 07/10/2004   Diabetes mellitus    Hypercholesteremia    Hypertension    Mechanical heart valve present    H. aortic valve replacement at the time of bypass surgery October 2005   Moderate to severe pulmonary hypertension (HCC)    Peripheral arterial disease (HCC)    history of left common iliac artery PTA and stenting for a chronic total occlusion 08/26/01   S/P cholecystectomy 03/06/2018    SURGICAL HISTORY: Past Surgical History:  Procedure Laterality Date   anterior repair  2009   AORTIC VALVE REPLACEMENT  2005   Dr. Laneta Simmers   CARDIAC CATHETERIZATION  11/10/2004   40% right common illiac, 70% in stent restenosis of distal left common illiac,    CARDIAC CATHETERIZATION  05/18/2004   LAD 50-70% midstenosis, RCA dominant w/50% stenosis, 50% Right common Illiac artery ostial stenosis, 90% in stent restenosis within midportion of left common illiac stent   Carotid Duplex  03/12/2012   RSA-elev. velocities suggestive of a 50-69% diameter reduction, Right&Left Bulb/Prox ICA-mild-mod.fibrous plaqueelevating Velocities abnormal study.   CHOLECYSTECTOMY N/A 03/01/2018   Procedure: LAPAROSCOPIC CHOLECYSTECTOMY;  Surgeon: Jimmye Norman, MD;  Location: [facility] OR;  Service: General;  Laterality: N/A;   COLONOSCOPY WITH PROPOFOL N/A 01/22/2018   Procedure: COLONOSCOPY WITH PROPOFOL;  Surgeon: Charlott Rakes, MD;  Location[facility] ENDOSCOPY;  Service: Endoscopy;  Laterality: N/A;   ESOPHAGOGASTRODUODENOSCOPY N/A 07/13/2022   Procedure: ESOPHAGOGASTRODUODENOSCOPY (EGD);  Surgeon: Kerin Salen, MD;  Location: [facility] ENDOSCOPY;  Service: Gastroenterology;  Laterality: N/A;   ESOPHAGOGASTRODUODENOSCOPY (EGD) WITH PROPOFOL N/A 01/22/2018  Procedure: ESOPHAGOGASTRODUODENOSCOPY (EGD) WITH PROPOFOL;  Surgeon: Charlott Rakes, MD;  Location: [facility] ENDOSCOPY;   Service: Endoscopy;  Laterality: N/A;   ESOPHAGOGASTRODUODENOSCOPY (EGD) WITH PROPOFOL N/A 11/21/2018   Procedure: ESOPHAGOGASTRODUODENOSCOPY (EGD) WITH PROPOFOL;  Surgeon: Kerin Salen, MD;  Location: [facility] ENDOSCOPY;  Service: Gastroenterology;  Laterality: N/A;   ESOPHAGOGASTRODUODENOSCOPY (EGD) WITH PROPOFOL Left 02/27/2019   Procedure: ESOPHAGOGASTRODUODENOSCOPY (EGD) WITH PROPOFOL;  Surgeon: Willis Modena, MD;  Location: [facility] ENDOSCOPY;  Service: Endoscopy;  Laterality: Left;   GIVENS CAPSULE STUDY N/A 11/21/2018   Procedure: GIVENS CAPSULE STUDY;  Surgeon: Kerin Salen, MD;  Location: [facility] ENDOSCOPY;  Service: Gastroenterology;  Laterality: N/A;  To be deployed during EGD   Lower Ext. Duplex  03/12/2012   Right Proximal CIA- vessel narrowing w/elevated velocities 0-49% diameter reduction. Right SFA-mild mixed density plaque throughout vessel.   NM MYOCAR PERF WALL MOTION  05/19/2010   protocol: Persantine, post stress EF 65%, negative for ischemia, low risk scan   RIGHT HEART CATH N/A 06/27/2018   Procedure: RIGHT HEART CATH;  Surgeon: Laurey Morale, MD;  Location: [facility] INVASIVE CV LAB;  Service: Cardiovascular;  Laterality: N/A;   TOTAL ABDOMINAL HYSTERECTOMY W/ BILATERAL SALPINGOOPHORECTOMY  1989   TRANSTHORACIC ECHOCARDIOGRAM  08/29/2012   Moderately calcified annulus of mitral valve, moderate regurg. of both mitral valve and tricuspid valve.     SOCIAL HISTORY: Social History   Socioeconomic History   Marital status: Widowed    Spouse name: Not on file   Number of children: Not on file   Years of education: Not on file   Highest education level: Not on file  Occupational History   Occupation: Retired  Tobacco Use   Smoking status: Former    Current packs/day: 1.00    Average packs/day: 1 pack/day for 30.0 years (30.0 ttl pk-yrs)    Types: Cigarettes   Smokeless tobacco: Never   Tobacco comments:    quit smoking 2005  Vaping Use   Vaping status: Never Used  Substance and Sexual  Activity   Alcohol use: No    Alcohol/week: 0.0 standard drinks of alcohol   Drug use: No   Sexual activity: Not Currently    Birth control/protection: None  Other Topics Concern   Not on file  Social History Narrative   Not on file   Social Determinants of Health   Financial Resource Strain: Low Risk  (02/28/2022)   Overall Financial Resource Strain (CARDIA)    Difficulty of Paying Living Expenses: Not hard at all  Food Insecurity: No Food Insecurity (12/25/2022)   Hunger Vital Sign    Worried About Running Out of Food in the Last Year: Never true    Ran Out of Food in the Last Year: Never true  Transportation Needs: No Transportation Needs (12/25/2022)   PRAPARE - Administrator, Civil Service (Medical): No    Lack of Transportation (Non-Medical): No  Physical Activity: Inactive (02/28/2022)   Exercise Vital Sign    Days of Exercise per Week: 0 days    Minutes of Exercise per Session: 0 min  Stress: No Stress Concern Present (02/28/2022)   [employer] of Occupational Health - Occupational Stress Questionnaire    Feeling of Stress : Only a little  Social Connections: Moderately Integrated (06/22/2022)   Social Connection and Isolation Panel [NHANES]    Frequency of Communication with Friends and Family: More than three times a week    Frequency of Social Gatherings with Friends and Family: More than three times  a week    Attends Religious Services: More than 4 times per year    Active Member of Clubs or Organizations: Yes    Attends Banker Meetings: More than 4 times per year    Marital Status: Widowed  Intimate Partner Violence: Not At Risk (12/25/2022)   Humiliation, Afraid, Rape, and Kick questionnaire    Fear of Current or Ex-Partner: No    Emotionally Abused: No    Physically Abused: No    Sexually Abused: No    FAMILY HISTORY: Family History  Problem Relation Age of Onset   Heart disease Father    Hypertension Father    Hyperlipidemia  Father    Breast cancer Neg Hx     Review of Systems  Constitutional:  Positive for fatigue. Negative for appetite change, chills, fever and unexpected weight change.  HENT:   Negative for hearing loss, lump/mass and trouble swallowing.   Eyes:  Negative for eye problems and icterus.  Respiratory:  Negative for chest tightness, cough and shortness of breath.   Cardiovascular:  Negative for chest pain, leg swelling and palpitations.  Gastrointestinal:  Negative for abdominal distention, abdominal pain, constipation, diarrhea, nausea and vomiting.  Endocrine: Negative for hot flashes.  Genitourinary:  Negative for difficulty urinating.   Musculoskeletal:  Negative for arthralgias.  Skin:  Negative for itching and rash.  Neurological:  Negative for dizziness, extremity weakness, headaches and numbness.  Hematological:  Negative for adenopathy. Does not bruise/bleed easily.  Psychiatric/Behavioral:  Negative for depression. The patient is not nervous/anxious.       PHYSICAL EXAMINATION    Vitals:   05/25/23 1219  BP: 113/67  Pulse: (!) 52  Resp: 18  Temp: (!) 97.3 F (36.3 C)  SpO2: 100%    Physical Exam Constitutional:      General: She is not in acute distress.    Appearance: Normal appearance. She is not toxic-appearing.     Comments: Examined in wheelchair  HENT:     Head: Normocephalic and atraumatic.     Mouth/Throat:     Mouth: Mucous membranes are moist.     Pharynx: Oropharynx is clear. No oropharyngeal exudate or posterior oropharyngeal erythema.  Eyes:     General: No scleral icterus. Cardiovascular:     Rate and Rhythm: Normal rate and regular rhythm.     Pulses: Normal pulses.     Heart sounds: Normal heart sounds.  Pulmonary:     Effort: Pulmonary effort is normal.     Breath sounds: Normal breath sounds.  Abdominal:     General: Abdomen is flat. Bowel sounds are normal. There is no distension.     Palpations: Abdomen is soft.     Tenderness: There  is no abdominal tenderness.  Musculoskeletal:        General: Swelling (1+ pedal edema) present.     Cervical back: Neck supple.  Lymphadenopathy:     Cervical: No cervical adenopathy.  Skin:    General: Skin is warm and dry.     Findings: No rash.  Neurological:     General: No focal deficit present.     Mental Status: She is alert.  Psychiatric:        Mood and Affect: Mood normal.        Behavior: Behavior normal.     LABORATORY DATA:  CBC    Component Value Date/Time   WBC 5.2 05/25/2023 1208   RBC 2.91 (L) 05/25/2023 1208   RBC 2.87 (L)  05/25/2023 1208   HGB 8.9 (L) 05/25/2023 1208   HGB 7.6 (L) 04/19/2023 1337   HCT 28.1 (L) 05/25/2023 1208   HCT 28.5 (L) 11/20/2018 1824   PLT 93 (L) 05/25/2023 1208   PLT 136 (L) 04/19/2023 1337   MCV 96.6 05/25/2023 1208   MCH 30.6 05/25/2023 1208   MCHC 31.7 05/25/2023 1208   RDW 19.8 (H) 05/25/2023 1208   LYMPHSABS 1.0 05/25/2023 1208   MONOABS 0.5 05/25/2023 1208   EOSABS 0.4 05/25/2023 1208   BASOSABS 0.0 05/25/2023 1208    CMP     Component Value Date/Time   NA 134 (L) 05/25/2023 1208   NA 137 12/06/2017 1436   K 4.8 05/25/2023 1208   CL 101 05/25/2023 1208   CO2 23 05/25/2023 1208   GLUCOSE 165 (H) 05/25/2023 1208   BUN 99 (H) 05/25/2023 1208   BUN 20 12/06/2017 1436   CREATININE 3.34 (H) 05/25/2023 1208   CREATININE 2.32 (H) 12/08/2022 1208   CALCIUM 8.0 (L) 05/25/2023 1208   PROT 5.7 (L) 05/25/2023 1208   ALBUMIN 3.5 05/25/2023 1208   AST 32 05/25/2023 1208   AST 60 (H) 12/08/2022 1208   ALT 14 05/25/2023 1208   ALT 42 12/08/2022 1208   ALKPHOS 117 05/25/2023 1208   BILITOT 0.4 05/25/2023 1208   BILITOT 0.7 12/08/2022 1208   GFRNONAA 13 (L) 05/25/2023 1208   GFRNONAA 20 (L) 12/08/2022 1208   GFRAA 32 (L) 03/30/2020 0932    ASSESSMENT and THERAPY PLAN:   Anemia of chronic disease [name] is here for follow-up of her multifactorial anemia, anemia of chronic disease.  She continues on retacrit  40,000 units every 2 weeks for a hemoglobin less than 10.    Will proceed with Retacrit today her hemoglobin is 8.9.  Her ferritin is pending.  Since she had a reaction to the IV iron I recommended that she continue with her oral iron.  We will make further recommendations based on her ferritin level.  Saraya will return in 2 weeks for labs, follow-up with Dr. Al Pimple, and her next injection.  All questions were answered. The patient knows to call the clinic with any problems, questions or concerns. We can certainly see the patient much sooner if necessary.  Total encounter time:20 minutes*in face-to-face visit time, chart review, lab review, care coordination, order entry, and documentation of the encounter time.    Lillard Anes, NP 05/25/23 3:36 PM Medical Oncology and Hematology [facility] [address] [place], [place] [address] Tel. [phone]    Fax. [phone]  *Total Encounter Time as defined by the Centers for Medicare and Medicaid Services includes, in addition to the face-to-face time of a patient visit (documented in the note above) non-face-to-face time: obtaining and reviewing outside history, ordering and reviewing medications, tests or procedures, care coordination (communications with other health care professionals or caregivers) and documentation in the medical record.

## 2023-05-25 NOTE — Assessment & Plan Note (Signed)
[name] is here for follow-up of her multifactorial anemia, anemia of chronic disease.  She continues on retacrit 40,000 units every 2 weeks for a hemoglobin less than 10.    Will proceed with Retacrit today her hemoglobin is 8.9.  Her ferritin is pending.  Since she had a reaction to the IV iron I recommended that she continue with her oral iron.  We will make further recommendations based on her ferritin level.  [name] will return in 2 weeks for labs, follow-up with Dr. Al Pimple, and her next injection.

## 2023-05-28 ENCOUNTER — Encounter: Payer: Self-pay | Admitting: Hematology and Oncology

## 2023-05-30 ENCOUNTER — Ambulatory Visit: Payer: Medicare HMO | Attending: Cardiology | Admitting: *Deleted

## 2023-05-30 DIAGNOSIS — Z952 Presence of prosthetic heart valve: Secondary | ICD-10-CM

## 2023-05-30 DIAGNOSIS — Z5181 Encounter for therapeutic drug level monitoring: Secondary | ICD-10-CM

## 2023-05-30 LAB — POCT INR: INR: 2.2 (ref 2.0–3.0)

## 2023-05-30 NOTE — Patient Instructions (Signed)
Continue warfarin 1 tablet (5mg  daily) daily Recheck in 4 wk

## 2023-06-15 DIAGNOSIS — E1122 Type 2 diabetes mellitus with diabetic chronic kidney disease: Secondary | ICD-10-CM | POA: Diagnosis not present

## 2023-06-18 ENCOUNTER — Inpatient Hospital Stay: Payer: Medicare HMO | Admitting: Hematology and Oncology

## 2023-06-18 ENCOUNTER — Inpatient Hospital Stay: Payer: Medicare HMO

## 2023-06-18 ENCOUNTER — Inpatient Hospital Stay: Payer: Medicare HMO | Attending: Hematology and Oncology

## 2023-06-18 VITALS — BP 136/77 | HR 44 | Temp 97.7°F | Resp 18 | Ht <= 58 in | Wt 107.8 lb

## 2023-06-18 DIAGNOSIS — D638 Anemia in other chronic diseases classified elsewhere: Secondary | ICD-10-CM | POA: Diagnosis not present

## 2023-06-18 DIAGNOSIS — D591 Autoimmune hemolytic anemia, unspecified: Secondary | ICD-10-CM | POA: Diagnosis not present

## 2023-06-18 DIAGNOSIS — D649 Anemia, unspecified: Secondary | ICD-10-CM

## 2023-06-18 DIAGNOSIS — N184 Chronic kidney disease, stage 4 (severe): Secondary | ICD-10-CM | POA: Diagnosis not present

## 2023-06-18 DIAGNOSIS — D631 Anemia in chronic kidney disease: Secondary | ICD-10-CM | POA: Diagnosis not present

## 2023-06-18 DIAGNOSIS — D500 Iron deficiency anemia secondary to blood loss (chronic): Secondary | ICD-10-CM

## 2023-06-18 LAB — COMPREHENSIVE METABOLIC PANEL
ALT: 17 U/L (ref 0–44)
AST: 33 U/L (ref 15–41)
Albumin: 3.8 g/dL (ref 3.5–5.0)
Alkaline Phosphatase: 108 U/L (ref 38–126)
Anion gap: 10 (ref 5–15)
BUN: 97 mg/dL — ABNORMAL HIGH (ref 8–23)
CO2: 24 mmol/L (ref 22–32)
Calcium: 9.2 mg/dL (ref 8.9–10.3)
Chloride: 100 mmol/L (ref 98–111)
Creatinine, Ser: 2.72 mg/dL — ABNORMAL HIGH (ref 0.44–1.00)
GFR, Estimated: 17 mL/min — ABNORMAL LOW (ref >60)
Glucose, Bld: 203 mg/dL — ABNORMAL HIGH (ref 70–99)
Potassium: 4.9 mmol/L (ref 3.5–5.1)
Sodium: 134 mmol/L — ABNORMAL LOW (ref 135–145)
Total Bilirubin: 0.5 mg/dL (ref 0.3–1.2)
Total Protein: 6.1 g/dL — ABNORMAL LOW (ref 6.5–8.1)

## 2023-06-18 LAB — CBC WITH DIFFERENTIAL/PLATELET
Abs Immature Granulocytes: 0.01 10*3/uL (ref 0.00–0.07)
Basophils Absolute: 0.1 10*3/uL (ref 0.0–0.1)
Basophils Relative: 1 %
Eosinophils Absolute: 0.4 10*3/uL (ref 0.0–0.5)
Eosinophils Relative: 8 %
HCT: 32.8 % — ABNORMAL LOW (ref 36.0–46.0)
Hemoglobin: 10.8 g/dL — ABNORMAL LOW (ref 12.0–15.0)
Immature Granulocytes: 0 %
Lymphocytes Relative: 25 %
Lymphs Abs: 1.1 10*3/uL (ref 0.7–4.0)
MCH: 31.4 pg (ref 26.0–34.0)
MCHC: 32.9 g/dL (ref 30.0–36.0)
MCV: 95.3 fL (ref 80.0–100.0)
Monocytes Absolute: 0.4 10*3/uL (ref 0.1–1.0)
Monocytes Relative: 10 %
Neutro Abs: 2.4 10*3/uL (ref 1.7–7.7)
Neutrophils Relative %: 56 %
Platelets: 84 10*3/uL — ABNORMAL LOW (ref 150–400)
RBC: 3.44 MIL/uL — ABNORMAL LOW (ref 3.87–5.11)
RDW: 16.4 % — ABNORMAL HIGH (ref 11.5–15.5)
WBC: 4.3 10*3/uL (ref 4.0–10.5)
nRBC: 0 % (ref 0.0–0.2)

## 2023-06-18 LAB — SAMPLE TO BLOOD BANK

## 2023-06-18 NOTE — Progress Notes (Signed)
[facility] Cancer Follow up:    [name], [name] Koh, MD [address] [place] [address]   DIAGNOSIS:  with multifactorial anemia, as follows:             (a) anemia of chronic inflammation: As of May 2020 she has a SED rate of 95, CRP 1.1, positive ANA and RF; subsequently she was found to be ANCA positive             (b) hemolysis (mild): she has a mildly elevated LDH and a DAT positive for complement, not IgG; this is c/w (a) above             (c) anemia of renal insufficiency: inadequate EPO resonse to anemia                         (d) GIB/ iron deficiency in patient on lifelong anticoagulaton   (1) Feraheme received 03/01/2019, repeated 03/20/2019              (a) reticulocyte 03/07/2019 up from 43.4 a year ago to 191.8 after iron infusion             (b) feraheme repeated on 05/12/2019             (c) subsequent ferritin levels >100             (D) Ferrliceit 250 mg last given 06/25/22   (2) darbepoetin: starting 05/05/2019 at given every 2 weeks             (a) dose increased to 200 mcg every week starting 07/01/2019              (b) back to every 2 weeks beginning 08/04/2019 still at 200 mcg dose             (c) switched to Retacrit every 4 weeks as of 08/18/2019   (3) ANCA positive vasculitis: diagnosed 05/08/2019 (titer 1:640)             (a) prednisone 40 mg daily started 05/31/2019             (b) rituximab weekly started 06/12/2019 (received test dose 06/05/2019)                         (i) hepatitis B sAg, core Ab and HIV negative 05/08/2019                         (ii) rituximab held after 07/14/2019 dose (received 5 weekly doses) with neutropenia                         (iii) rituximab resumed 08/18/2019 but changed to monthly                         (iv) rituximab changed to every 2 months beginning with 02/02/2020 dose                         (v) rituximab changed to every 12 weeks after the 10/12/2020 dose             (c) prednisone tapered  to off as of 09/15/2019                          (4) Anemia  worsened in spring 2023, refractory to Rituximab and requiring prednisone restart with slow taper. This was completed in March 2024 per chart review.    (5) osteopenia with a T score of -1.5 on bone density 02/28/2016 at the [facility] center   CURRENT THERAPY: Retacrit every 2 weeks  INTERVAL HISTORY:  [name] 82 y.o. female returns for follow-up and evaluation of her multifactorial anemia.  [name] is feeling moderately well.  She denies any significant issues currently.  She is mildly fatigued.  She continues to have some abdominal discomfort. LE swelling very well controlled. She has been taking Coumadin as instructed.  Rest of the pertinent 10 point ROS reviewed and negative  Patient Active Problem List   Diagnosis Date Noted   Chronic cough 03/26/2023   Protein-calorie malnutrition, severe 12/26/2022   Acute GI bleeding 12/24/2022   Coagulopathy (HCC) 12/24/2022   Melena 12/24/2022   Duodenitis 12/24/2022   Noninfectious gastroenteritis 12/24/2022   CAD, multiple vessel 12/24/2022   Acute on chronic heart failure with preserved ejection fraction (HFpEF) (HCC) 08/29/2022   Acute exacerbation of CHF (congestive heart failure) (HCC) 08/29/2022   Acute urinary retention 08/28/2022   Acute kidney injury superimposed on chronic kidney disease (HCC) 08/27/2022   Constipation 08/27/2022   Nausea and vomiting 08/27/2022   Hypoalbuminemia due to protein-calorie malnutrition (HCC) 08/27/2022   Type 2 diabetes mellitus with hyperglycemia (HCC) 08/27/2022   Palliative care encounter    Goals of care, counseling/discussion    Encounter for therapeutic drug monitoring    GI bleed 07/09/2022   GI bleeding 07/08/2022   Acute on chronic anemia 07/08/2022   History of Clostridioides difficile colitis 07/08/2022   DNR (do not resuscitate)/DNI(Do Not Intubate) 07/08/2022   Anemia of chronic disease 07/07/2022   Pressure injury  of skin 06/23/2022   UTI (urinary tract infection) 06/22/2022   Joint pain 06/09/2022   Parietoalveolar pneumopathy (HCC) 06/09/2022   Primary osteoarthritis 06/09/2022   Rheumatoid factor positive 06/09/2022   C. difficile colitis 04/19/2022   Colitis 04/18/2022   PAH (pulmonary artery hypertension) (HCC) 04/18/2022   Emphysematous cystitis 04/13/2022   Female bladder prolapse 04/13/2022   Diverticulitis 04/12/2022   Autoimmune hemolytic anemia (HCC) 01/02/2022   Bilateral lower extremity edema 01/02/2022   Refractory anemia (HCC) 12/15/2021   Abnormal chest x-ray 12/15/2021   Supratherapeutic INR 12/15/2021   Elevated troponin 12/15/2021   Unilateral primary osteoarthritis, right hip 05/31/2021   Midline cystocele 05/30/2021   Primary localized osteoarthritis of pelvic region and thigh 05/30/2021   Vaginal pain 05/30/2021   Asymmetric SNHL (sensorineural hearing loss) 01/06/2021   Referred otalgia of both ears 01/06/2021   Bilateral hearing loss 08/31/2020   Low back pain 02/20/2020   CKD (chronic kidney disease), stage IV (HCC) 10/13/2019   Leukopenia due to antineoplastic chemotherapy (HCC) 08/18/2019   Thrombophilia (HCC) 06/19/2019   Encounter for general adult medical examination without abnormal findings 06/13/2019   Arteritis (HCC) 06/06/2019   Hypertensive heart and chronic kidney disease with heart failure and stage 1 through stage 4 chronic kidney disease, or unspecified chronic kidney disease (HCC) 06/06/2019   Muscle weakness 06/06/2019   Pancytopenia (HCC) 05/28/2019   Dyspnea 05/28/2019   Vasculitis, ANCA positive 05/28/2019   Disorder of connective tissue (HCC) 05/14/2019   Renal insufficiency 05/08/2019   PAF (paroxysmal atrial fibrillation) (HCC) 04/07/2019   IDA (iron deficiency anemia) 02/28/2019   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 (HCC) 02/28/2019   Diabetic retinopathy associated with type  2 diabetes mellitus (HCC) 09/02/2018    Chronic pain 06/19/2018   Non-thrombocytopenic purpura (HCC) 05/21/2018   Gout 03/22/2018   Malnutrition of moderate degree 03/12/2018   Diabetes mellitus type 2, controlled (HCC) 03/06/2018   Chronic atrial fibrillation (HCC) 03/06/2018   H/O mechanical aortic valve replacement 03/06/2018   Moderate to severe pulmonary hypertension (HCC) 03/06/2018   GERD without esophagitis 02/21/2018   Anxiety 02/21/2018   Chronic diastolic CHF (congestive heart failure) (HCC) 02/21/2018   General weakness 02/02/2018   Thrombocytopenia (HCC) 01/25/2018   Arm pain, diffuse 01/25/2018   Anemia 01/23/2018   Aortic atherosclerosis (HCC) 01/23/2018   Age-related osteoporosis without current pathological fracture 05/24/2017   Carotid artery occlusion 05/24/2017   Generalized anxiety disorder 05/24/2017   Hearing loss 05/24/2017   Presence of prosthetic heart valve 05/24/2017   Carotid artery disease (HCC) 08/18/2013   Peripheral arterial disease (HCC) 08/18/2013   Diabetes mellitus without complication (HCC) 08/18/2013   Essential hypertension    Hypercholesteremia    Mechanical heart valve present    Chronic anticoagulation     is allergic to augmentin [amoxicillin-pot clavulanate], ciprofloxacin, codeine, coreg [carvedilol], diflucan [fluconazole], levofloxacin in d5w, losartan, privigen [immune globulin (human)], sulfamethoxazole-trimethoprim, verapamil, zetia [ezetimibe], zocor [simvastatin - high dose], and gatifloxacin.  MEDICAL HISTORY: Past Medical History:  Diagnosis Date   Aortic atherosclerosis (HCC) 01/23/2018   Atrial fibrillation (HCC)    Benign positional vertigo 10/06/2011   Carotid artery disease (HCC)    Chemotherapy induced neutropenia (HCC) 07/21/2019   Cholelithiasis 01/23/2018   Cholelithiasis 01/23/2018   Chronic anticoagulation    Colovesical fistula, ruled out 04/14/2022   Coronary artery disease    status post coronary artery bypass grafting times 07/10/2004    Diabetes mellitus    Hypercholesteremia    Hypertension    Mechanical heart valve present    H. aortic valve replacement at the time of bypass surgery October 2005   Moderate to severe pulmonary hypertension (HCC)    Peripheral arterial disease (HCC)    history of left common iliac artery PTA and stenting for a chronic total occlusion 08/26/01   S/P cholecystectomy 03/06/2018    SURGICAL HISTORY: Past Surgical History:  Procedure Laterality Date   anterior repair  2009   AORTIC VALVE REPLACEMENT  2005   Dr. Laneta Simmers   CARDIAC CATHETERIZATION  11/10/2004   40% right common illiac, 70% in stent restenosis of distal left common illiac,    CARDIAC CATHETERIZATION  05/18/2004   LAD 50-70% midstenosis, RCA dominant w/50% stenosis, 50% Right common Illiac artery ostial stenosis, 90% in stent restenosis within midportion of left common illiac stent   Carotid Duplex  03/12/2012   RSA-elev. velocities suggestive of a 50-69% diameter reduction, Right&Left Bulb/Prox ICA-mild-mod.fibrous plaqueelevating Velocities abnormal study.   CHOLECYSTECTOMY N/A 03/01/2018   Procedure: LAPAROSCOPIC CHOLECYSTECTOMY;  Surgeon: Jimmye Norman, MD;  Location: [facility] OR;  Service: General;  Laterality: N/A;   COLONOSCOPY WITH PROPOFOL N/A 01/22/2018   Procedure: COLONOSCOPY WITH PROPOFOL;  Surgeon: Charlott Rakes, MD;  Location: [facility] ENDOSCOPY;  Service: Endoscopy;  Laterality: N/A;   ESOPHAGOGASTRODUODENOSCOPY N/A 07/13/2022   Procedure: ESOPHAGOGASTRODUODENOSCOPY (EGD);  Surgeon: Kerin Salen, MD;  Location: [facility] ENDOSCOPY;  Service: Gastroenterology;  Laterality: N/A;   ESOPHAGOGASTRODUODENOSCOPY (EGD) WITH PROPOFOL N/A 01/22/2018   Procedure: ESOPHAGOGASTRODUODENOSCOPY (EGD) WITH PROPOFOL;  Surgeon: Charlott Rakes, MD;  Location: [facility] ENDOSCOPY;  Service: Endoscopy;  Laterality: N/A;   ESOPHAGOGASTRODUODENOSCOPY (EGD) WITH PROPOFOL N/A 11/21/2018   Procedure: ESOPHAGOGASTRODUODENOSCOPY (EGD) WITH PROPOFOL;  Surgeon:  Kerin Salen, MD;  Location: [facility] ENDOSCOPY;  Service: Gastroenterology;  Laterality: N/A;   ESOPHAGOGASTRODUODENOSCOPY (EGD) WITH PROPOFOL Left 02/27/2019   Procedure: ESOPHAGOGASTRODUODENOSCOPY (EGD) WITH PROPOFOL;  Surgeon: Willis Modena, MD;  Location: [facility] ENDOSCOPY;  Service: Endoscopy;  Laterality: Left;   GIVENS CAPSULE STUDY N/A 11/21/2018   Procedure: GIVENS CAPSULE STUDY;  Surgeon: Kerin Salen, MD;  Location: [facility] ENDOSCOPY;  Service: Gastroenterology;  Laterality: N/A;  To be deployed during EGD   Lower Ext. Duplex  03/12/2012   Right Proximal CIA- vessel narrowing w/elevated velocities 0-49% diameter reduction. Right SFA-mild mixed density plaque throughout vessel.   NM MYOCAR PERF WALL MOTION  05/19/2010   protocol: Persantine, post stress EF 65%, negative for ischemia, low risk scan   RIGHT HEART CATH N/A 06/27/2018   Procedure: RIGHT HEART CATH;  Surgeon: Laurey Morale, MD;  Location: [facility] INVASIVE CV LAB;  Service: Cardiovascular;  Laterality: N/A;   TOTAL ABDOMINAL HYSTERECTOMY W/ BILATERAL SALPINGOOPHORECTOMY  1989   TRANSTHORACIC ECHOCARDIOGRAM  08/29/2012   Moderately calcified annulus of mitral valve, moderate regurg. of both mitral valve and tricuspid valve.     SOCIAL HISTORY: Social History   Socioeconomic History   Marital status: Widowed    Spouse name: Not on file   Number of children: Not on file   Years of education: Not on file   Highest education level: Not on file  Occupational History   Occupation: Retired  Tobacco Use   Smoking status: Former    Current packs/day: 1.00    Average packs/day: 1 pack/day for 30.0 years (30.0 ttl pk-yrs)    Types: Cigarettes   Smokeless tobacco: Never   Tobacco comments:    quit smoking 2005  Vaping Use   Vaping status: Never Used  Substance and Sexual Activity   Alcohol use: No    Alcohol/week: 0.0 standard drinks of alcohol   Drug use: No   Sexual activity: Not Currently    Birth control/protection: None  Other Topics  Concern   Not on file  Social History Narrative   Not on file   Social Determinants of Health   Financial Resource Strain: Low Risk  (02/28/2022)   Overall Financial Resource Strain (CARDIA)    Difficulty of Paying Living Expenses: Not hard at all  Food Insecurity: No Food Insecurity (12/25/2022)   Hunger Vital Sign    Worried About Running Out of Food in the Last Year: Never true    Ran Out of Food in the Last Year: Never true  Transportation Needs: No Transportation Needs (12/25/2022)   PRAPARE - Administrator, Civil Service (Medical): No    Lack of Transportation (Non-Medical): No  Physical Activity: Inactive (02/28/2022)   Exercise Vital Sign    Days of Exercise per Week: 0 days    Minutes of Exercise per Session: 0 min  Stress: No Stress Concern Present (02/28/2022)   [employer] of Occupational Health - Occupational Stress Questionnaire    Feeling of Stress : Only a little  Social Connections: Moderately Integrated (06/22/2022)   Social Connection and Isolation Panel [NHANES]    Frequency of Communication with Friends and Family: More than three times a week    Frequency of Social Gatherings with Friends and Family: More than three times a week    Attends Religious Services: More than 4 times per year    Active Member of [employer] or Organizations: Yes    Attends Banker Meetings: More than 4 times per  year    Marital Status: Widowed  Intimate Partner Violence: Not At Risk (12/25/2022)   Humiliation, Afraid, Rape, and Kick questionnaire    Fear of Current or Ex-Partner: No    Emotionally Abused: No    Physically Abused: No    Sexually Abused: No    FAMILY HISTORY: Family History  Problem Relation Age of Onset   Heart disease Father    Hypertension Father    Hyperlipidemia Father    Breast cancer Neg Hx     Review of Systems  Constitutional:  Positive for fatigue. Negative for appetite change, chills, fever and unexpected weight change.   HENT:   Negative for hearing loss, lump/mass and trouble swallowing.   Eyes:  Negative for eye problems and icterus.  Respiratory:  Negative for chest tightness, cough and shortness of breath.   Cardiovascular:  Negative for chest pain, leg swelling and palpitations.  Gastrointestinal:  Negative for abdominal distention, abdominal pain, constipation, diarrhea, nausea and vomiting.  Endocrine: Negative for hot flashes.  Genitourinary:  Negative for difficulty urinating.   Musculoskeletal:  Negative for arthralgias.  Skin:  Negative for itching and rash.  Neurological:  Negative for dizziness, extremity weakness, headaches and numbness.  Hematological:  Negative for adenopathy. Does not bruise/bleed easily.  Psychiatric/Behavioral:  Negative for depression. The patient is not nervous/anxious.       PHYSICAL EXAMINATION    Vitals:   06/18/23 1135  BP: 136/77  Pulse: (!) 44  Resp: 18  Temp: 97.7 F (36.5 C)  SpO2: 100%     Physical Exam Constitutional:      General: She is not in acute distress.    Appearance: Normal appearance. She is not toxic-appearing.     Comments: Examined in wheelchair  HENT:     Head: Normocephalic and atraumatic.     Mouth/Throat:     Mouth: Mucous membranes are moist.     Pharynx: Oropharynx is clear. No oropharyngeal exudate or posterior oropharyngeal erythema.  Eyes:     General: No scleral icterus. Cardiovascular:     Rate and Rhythm: Normal rate and regular rhythm.     Pulses: Normal pulses.     Heart sounds: Normal heart sounds.  Pulmonary:     Effort: Pulmonary effort is normal.     Breath sounds: Normal breath sounds.  Abdominal:     General: Abdomen is flat. Bowel sounds are normal. There is no distension.     Palpations: Abdomen is soft.     Tenderness: There is no abdominal tenderness.  Musculoskeletal:        General: Swelling (1+ pedal edema) present.     Cervical back: Neck supple.  Lymphadenopathy:     Cervical: No  cervical adenopathy.  Skin:    General: Skin is warm and dry.     Findings: No rash.  Neurological:     General: No focal deficit present.     Mental Status: She is alert.  Psychiatric:        Mood and Affect: Mood normal.        Behavior: Behavior normal.    LABORATORY DATA:  CBC    Component Value Date/Time   WBC 4.3 06/18/2023 1043   RBC 3.44 (L) 06/18/2023 1043   HGB 10.8 (L) 06/18/2023 1043   HGB 7.6 (L) 04/19/2023 1337   HCT 32.8 (L) 06/18/2023 1043   HCT 28.5 (L) 11/20/2018 1824   PLT 84 (L) 06/18/2023 1043   PLT 136 (L) 04/19/2023 1337  MCV 95.3 06/18/2023 1043   MCH 31.4 06/18/2023 1043   MCHC 32.9 06/18/2023 1043   RDW 16.4 (H) 06/18/2023 1043   LYMPHSABS 1.1 06/18/2023 1043   MONOABS 0.4 06/18/2023 1043   EOSABS 0.4 06/18/2023 1043   BASOSABS 0.1 06/18/2023 1043    CMP     Component Value Date/Time   NA 134 (L) 06/18/2023 1043   NA 137 12/06/2017 1436   K 4.9 06/18/2023 1043   CL 100 06/18/2023 1043   CO2 24 06/18/2023 1043   GLUCOSE 203 (H) 06/18/2023 1043   BUN 97 (H) 06/18/2023 1043   BUN 20 12/06/2017 1436   CREATININE 2.72 (H) 06/18/2023 1043   CREATININE 2.32 (H) 12/08/2022 1208   CALCIUM 9.2 06/18/2023 1043   PROT 6.1 (L) 06/18/2023 1043   ALBUMIN 3.8 06/18/2023 1043   AST 33 06/18/2023 1043   AST 60 (H) 12/08/2022 1208   ALT 17 06/18/2023 1043   ALT 42 12/08/2022 1208   ALKPHOS 108 06/18/2023 1043   BILITOT 0.5 06/18/2023 1043   BILITOT 0.7 12/08/2022 1208   GFRNONAA 17 (L) 06/18/2023 1043   GFRNONAA 20 (L) 12/08/2022 1208   GFRAA 32 (L) 03/30/2020 0932    ASSESSMENT and THERAPY PLAN:   [name] is a 82 y.o. female who presents for multifactorial anemia, previously treated with rituximab for ANCA positive hemolytic anemia, previously on maintenance rituximab every 12 weeks presented with worsening anemia thought to be related to her hemolysis.  She was diagnosed with elevated ANCA titers 1 is to 640 and previously treated for  ANCA positive hemolytic anemia, seen by rheumatology Dr. Carleene Cooper back in 2020, responded at that time to prednisone and rituximab and continued on rituximab maintenance.  However her rituximab was changed to every 43-month infusions and she relapsed before her second cycle of every 81-month rituximab.    # Multifactorial Anemia:  --Bone marrow biopsy from 02/20/2022 showed normocellular marrow with trilineage hematopoiesis. No leukemia, lymphoma or high-grade dysplasia identified. --CT CAP from 02/20/2022 did not show any underlying cause for anemia.  --ANCA titers are normal when checked on 02/10/2022 -- Currently on EPO every 2 weeks.  Today her hemoglobin is 10.8, we will omit epo. RTC in 2 weeks with labs and possibly epo. FU in 4 weeks.   #Bilateral lower extremity edema significantly improved.    #Warfarin therapy for St. Jude's valve --Under the care of  [facility].     # CKd stage IV, not an ideal candidate for dialysis.  All questions were answered. The patient knows to call the clinic with any problems, questions or concerns. We can certainly see the patient much sooner if necessary.  Total encounter time:20 minutes*in face-to-face visit time, chart review, lab review, care coordination, order entry, and documentation of the encounter time.   *Total Encounter Time as defined by the Centers for Medicare and Medicaid Services includes, in addition to the face-to-face time of a patient visit (documented in the note above) non-face-to-face time: obtaining and reviewing outside history, ordering and reviewing medications, tests or procedures, care coordination (communications with other health care professionals or caregivers) and documentation in the medical record.

## 2023-06-25 NOTE — Progress Notes (Signed)
Date:  06/26/2023   ID:  [name], DOB [birth year], MRN [id number]    Provider location: [facility] Type of Visit: Established patient   PCP:  Gaspar Garbe, MD  Primary Cardiologist:  Nanetta Batty, MD HF Cardiologist: Dr. Shirlee Latch   History of Present Illness: [name] is a 82 y.o. female who has a history of PAD, CAD s/p CABG, mechanical AVR, pulmonary hypertension, and chronic diastolic CHF with prominent RV dysfunction. Patient had CABG-AVR in 10/05.  She has been in chronic atrial fibrillation, it appears, since at least 4/17.  She has significant PAD followed by Dr. Allyson Sabal.     She was admitted in 5/19 with dyspnea and marked volume overloaded. She was started on Lasix and it was titrated up.  Echo showed EF 55-60% with moderate RV dilation, severe RV systolic dysfunction, and PASP 88 mmHg.  RHC in 9/19 showed moderate pulmonary hypertension with near normal filling pressures. PVR was not particularly high at 3.1 WU primarily due to elevated cardiac output. V/Q scan did not show chronic PEs and PFTs were unremarkable. She is on Adcirca.    She was admitted in 2/20 with upper GI bleeding, found to have gastric ulcers.  She had a CT chest in 3/20 with subtle bilateral ground glass opacities.  Referred to pulmonary.   She was admitted again in 6/20 with a GI bleed and required transfusion.   She was seen by pulmonary in 7/20, PFTs showed no obstruction/restriction but decreased DLCO.    She was seen by rheumatology due to concern for rheumatoid arthritis.  She was thought to have gout and OA but no RA.   Echo was done in 7/20 showing  EF 60-65% with mild LVH, normal RV, mechanical aortic valve with mean gradient 17 mmHg, PASP 34 mmHg.   In 8/20, she was admitted with CHF/pneumonitis and pancytopenia.  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  She has been treated with rituximab and  prednisone, now off.   Echo 4/21 showed EF 65-70%, normal RV, moderate TR, mechanical aortic valve with mean gradient 20 mmHg, PASP 36 mmHg.   Had echo 4/22 w/ stable findings. LVEF 65-70% w/ GIIIDD, prior mechanical AVR stable w/ gradient similar to prior study (mean gradient 18 mmHg). RV normal w/ mildly elevated RVSP at 30.5.   Echo in 8/23 showed EF 55-60%, mild LVH, normal RV, PASP 42 mmHg, mild MR, mild-moderate TR, mechanical St Jude aortic valve with mean gradient 26 mmHg (up from 21 mmHg).   She was admitted in 11/23 with AKI on CKD stage 4, she was initially given IV fluid then Lasix when she became volume overloaded.  Thought to be poor HD candidate due to frailty.   Admit 3/24 with a/c anemia with concern for GI bleed and sepsis secondary to colitis. Declined endoscopic evaluation for anemia. Given 1 u RBCS. Treated with abx. Coursc/b urinary retention and AKI on CKD (Scr peaked at 2.8).   Admitted 4/24 with acute hypoxic respiratory failure 2/2 PNA and AKI. She was resuscitated with IVF and did require several doses of IV lasix.   Echo 5/24 showed EF 65-70%, RV is low normal, mild to moderate MR with mean MV gradient 5, mild to moderate TR, stable St Jude mechanical AoV with mild elevated gradient 22 mmHg.  Today she returns for HF follow up with her son. Overall feeling fine. She gets around her house with  walker and rollator without dyspnea. Had some dark blood after she had a BM recently, saw GI and they said it was hemorrhoids. Feels occasional palpitations. Denies CP, dizziness, edema, or PND/Orthopnea. Appetite ok. No fever or chills. Weight at home 107-111 pounds. Taking all medications. Lives with her daughter, uses a WC and walker. Main complaint is "tingling" in left leg x few weeks.  ECG (personally reviewed): Atrial Flutter 46 bpm  Labs (8/19): K 4.2, creatinine 0.8, LDL 56 Labs (9/19): K 4.3, Na 129, creatinine 0.87, hgb 9.8 Labs (10/19): ANA+, RF 36.8 (elevated),  SCL-70 negative, anti-centromere Ab negative, ds-DNA negative.  Labs (12/19): K 4, creatinine 1.09 Labs (1/20): CCP Ab elevated 115 Labs (2/20): K 4.1, creatinine 1.24 Labs (7/20): K 4.2, creatinine 1.99, hgb 6.5 (transfused) Labs (11/20): K 3.5, creatinine 1.64 Labs (12/20): LDL 60 Labs (5/21): K 4.4, creatinine 1.85 => 2.66 Labs (03/30/20): Creatinine 1.7  Labs (1/24): K 4.1, creatinine 2.35, hgb 9.5 Labs (9/24): K 4.9, creatinine 2.72, hgb 10.8   6 min walk (10/19): 189 m 6 min walk (1/20): 183 m   PMH: 1. Mechanical St Jude aortic valve 10/05: Goal INR 2.5-3.5.  2. CAD: s/p CABG x 2 with AVR in 10/05.  - Cardiolite in 2014 showed no ischemia.  3. HTN 4. Hyperlipidemia 5. Type 2 diabetes 6. PAD: Peripheral arterial dopplers (7/19) with >50% CIA stenosis on right, 70-99% CIA stenosis on the left.  7. H/o CCY 8. Atrial fibrillation: Long-standing persistent.  9. Chronic diastolic CHF with prominent RV dysfunction: Echo (5/19) with EF 55-60%, grade 3 diastolic dysfunction, mechanical aortic valve with mean gradient 10 mmHg, mild MR, moderate RV dilation with moderate-severely decreased RV systolic function, biatrial enlargement, PASP 88 mmHg.  - Echo (7/20): EF 60-65% with mild LVH, normal RV, mechanical aortic valve with mean gradient 17 mmHg, PASP 34 mmHg.  - Echo (4/21): EF 65-70%, normal RV, moderate TR, mechanical aortic valve with mean gradient 20 mmHg, PASP 36 mmHg.  - Echo (8/23): EF 55-60%, mild LVH, normal RV, PASP 42 mmHg, mild MR, mild-moderate TR, mechanical St Jude aortic valve with mean gradient 26 mmHg (up from 21 mmHg).  10. Carotid stenosis: 40-59% BICA stenosis on 7/19 carotid dopplers.  - Carotid dopplers (5/21): 40-59% BICA stenosis.  11. Pulmonary hypertension: RHC (9/19) with mean RA 5, PA 63/17 mean 36, mean PCWP 16, CI 4.16, PVR 3.1 WU.  - V/Q scan (10/19): no evidence for chronic PE.  - PFTs (10/19): minimal obstruction.  - ANA+, RF 36.8 (elevated), SCL-70  negative, anti-centromere Ab negative, ds-DNA negative, CCP Ab positive. She saw rheumatology, NOT thought to have RA.  - Echo in 7/20 with normal-appearing RV and PASP 34 mmHg.  - PFTs (7/20) with no significant obstruction/restriction but decreased DLCO.  - PYP scan not suggestive of TTR amyloidosis.  - Echo (4/21): EF 65-70%, normal RV, moderate TR, mechanical aortic valve with mean gradient 20 mmHg, PASP 36 mmHg.  - Echo (8/23): EF 55-60%, mild LVH, normal RV, PASP 42 mmHg, mild MR, mild-moderate TR, mechanical St Jude aortic valve with mean gradient 26 mmHg (up from 21 mmHg).  12. 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 History of GI bleeding.  15. 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  - Treated with Rituximab and prednisone.  16. CKD: stage 4.   Current Outpatient Medications  Medication Sig Dispense Refill   acetaminophen (TYLENOL) 325 MG tablet Take 325 mg by mouth every 6 (six) hours as needed  for mild pain or fever.     allopurinol (ZYLOPRIM) 100 MG tablet Take 100 mg by mouth daily.     ALPRAZolam (XANAX) 0.5 MG tablet Take 0.5 mg by mouth daily.     amLODipine (NORVASC) 10 MG tablet Take 1 tablet (10 mg total) by mouth daily. (Patient taking differently: Take 5 mg by mouth daily.) 90 tablet 3   atorvastatin (LIPITOR) 80 MG tablet Take 1 tablet (80 mg total) by mouth daily at 6 PM.     budesonide (PULMICORT) 0.5 MG/2ML nebulizer solution Take 2 mLs (0.5 mg total) by nebulization 2 (two) times daily. (Patient taking differently: Take 0.5 mg by nebulization 2 (two) times daily. As needed) 120 mL 1   Cholecalciferol (VITAMIN D3) 50 MCG (2000 UT) capsule Take 2,000 Units by mouth daily with lunch.     cloNIDine (CATAPRES) 0.2 MG tablet Take 0.2 mg by mouth 2 (two) times daily.     Cyanocobalamin (VITAMIN B12) 1000 MCG TBCR Take 1,000 mcg by mouth daily with lunch.     famotidine (PEPCID) 20 MG tablet Take 20 mg by mouth 2 (two)  times daily.     ferrous sulfate 325 (65 FE) MG tablet Take 1 tablet (325 mg total) by mouth daily with breakfast. (Patient taking differently: Patient takes 2 tablets by mouth daily.)  3   furosemide (LASIX) 40 MG tablet Patient takes 2 tablets by mouth in the morning and 1.5 tablets at night.     hydrALAZINE (APRESOLINE) 100 MG tablet Take 1 tablet (100 mg total) by mouth 3 (three) times daily. (Patient taking differently: Take 100 mg by mouth 2 (two) times daily.) 270 tablet 3   hydrocortisone (ANUSOL-HC) 2.5 % rectal cream Place 1 Application rectally 2 (two) times daily. For 14 days. May repeat if needed. 30 g 1   insulin aspart (NOVOLOG) 100 UNIT/ML injection Inject 2-8 Units into the skin 3 (three) times daily with meals. Per sliding scale, If blood sugar is 200 to 250, give 2 units. If blood sugar is 251 to 300, give 4 units. If blood sugar is 301 to 350, give 6 units. If blood sugar is greater than 350, give 8 units and call MD.     Insulin Syringe-Needle U-100 (B-D INS SYR ULTRAFINE .3CC/29G) 29G X 1/2" 0.3 ML MISC Use for sliding scale for breakthrough high blood sugar readings 100 each 1   ipratropium-albuterol (DUONEB) 0.5-2.5 (3) MG/3ML SOLN Take 3 mLs by nebulization every 6 (six) hours as needed. 360 mL 0   metoprolol succinate (TOPROL-XL) 25 MG 24 hr tablet Take 1 tablet (25 mg total) by mouth daily. 30 tablet 3   ondansetron (ZOFRAN-ODT) 4 MG disintegrating tablet Take 4 mg by mouth every 8 (eight) hours as needed for nausea or vomiting.     pantoprazole (PROTONIX) 40 MG tablet Take 1 tablet (40 mg total) by mouth 2 (two) times daily. 60 tablet 2   senna-docusate (SENOKOT-S) 8.6-50 MG tablet Take 2 tablets by mouth at bedtime. (Patient taking differently: Take 2 tablets by mouth at bedtime. As needed) 60 tablet 2   sertraline (ZOLOFT) 25 MG tablet Take 25 mg by mouth daily.     simethicone (GAS-X) 80 MG chewable tablet Chew 1 tablet (80 mg total) by mouth 4 (four) times daily as  needed for flatulence. 100 tablet 2   spironolactone (ALDACTONE) 25 MG tablet TAKE 1 TABLET EVERY DAY 90 tablet 3   tadalafil, PAH, (ADCIRCA) 20 MG tablet TAKE 2 TABLETS BY  MOUTH ONCE DAILY 60 tablet 11   warfarin (COUMADIN) 5 MG tablet Take 1 tablet (5 mg total) by mouth See admin instructions. Take 1 tablet 5mg  on Tuesday/Thursday/Sunday and 1/2 Tab (2.5 mg) on MWFSat (Patient taking differently: Take 5 mg by mouth See admin instructions.)     No current facility-administered medications for this encounter.    Allergies:   Augmentin [amoxicillin-pot clavulanate], Ciprofloxacin, Codeine, Coreg [carvedilol], Diflucan [fluconazole], Levofloxacin in d5w, Losartan, Privigen [immune globulin (human)], Sulfamethoxazole-trimethoprim, Verapamil, Zetia [ezetimibe], Zocor [simvastatin - high dose], and Gatifloxacin   Social History:  The patient  reports that she has quit smoking. Her smoking use included cigarettes. She has a 30 pack-year smoking history. She has never used smokeless tobacco. She reports that she does not drink alcohol and does not use drugs.   Family History:  [name] family history includes Heart disease in her father; Hyperlipidemia in her father; Hypertension in her father.   ROS:  Please see the history of present illness.   All other systems are personally reviewed and negative.   BP (!) 118/56   Pulse (!) 56   Wt 49.8 kg (109 lb 12.8 oz)   SpO2 99%   BMI 22.95 kg/m   Wt Readings from Last 3 Encounters:  06/26/23 49.8 kg (109 lb 12.8 oz)  06/18/23 48.9 kg (107 lb 12.8 oz)  05/25/23 50.9 kg (112 lb 3.2 oz)   Physical Exam:   General:  NAD. No resp difficulty, arrived in [facility], elderly, cachectic  HEENT: Normal Neck: Supple. No JVD. Carotids 2+ bilat; no bruits. No lymphadenopathy or thryomegaly appreciated. Cor: PMI nondisplaced. Irregular rate & rhythm. No rubs, gallops, + mechanical S2, 2/6 AS Lungs: Clear Abdomen: Soft, nontender, nondistended. No  hepatosplenomegaly. No bruits or masses. Good bowel sounds. Extremities: No cyanosis, clubbing, rash, trace pedal edema Neuro: Alert & oriented x 3, cranial nerves grossly intact. Moves all 4 extremities w/o difficulty. Affect pleasant.  Recent Labs: 10/26/2022: B Natriuretic Peptide 390.1 01/26/2023: Magnesium 2.0 06/18/2023: ALT 17; BUN 97; Creatinine, Ser 2.72; Hemoglobin 10.8; Platelets 84; Potassium 4.9; Sodium 134  Personally reviewed   ASSESSMENT AND PLAN:  1. Atrial fibrillation: Long-standing persistent.  She has been in AF, it appears, since 4/23. I suspect she would be unlikely to easily convert and remains in NSR at this point.  For now, will follow rate control strategy.  - Continue Toprol XL 25 mg daily.    - Continue warfarin, goal INR 2.5-3.5. [facility] 2. Chronic diastolic CHF: PYP scan did not suggest ATTR amyloidosis.  Most recent echo in 8/23 showed EF 55-60%, mild LVH, normal RV, PASP 42 mmHg, mild MR, mild-moderate TR, mechanical St Jude aortic valve with mean gradient 26 mmHg (up from 21 mmHg). Elevation in gradient across the aortic valve may play a role. She does not appear volume overloaded on exam, has some LEE which may be venous insufficiency vs side effect from amlodipine. NYHA class III, probably limited more by frailty than anything else.  - Continue Lasix 80 mg qam/60 mg qpm. Recent labs reviewed and are stable. 3. CAD: denies CP.  - Continue ASA. - Continue statin. 4. PAD: Significant on 7/19 dopplers but no claudication, no pedal ulcerations. Has "tingling" in left thigh. Pedal pulses present but weak. No pedal ulcers - Will arrange ABIs and refer back to Dr Allyson Sabal.  - Continue statin and ASA as above 5. Mechanical aortic valve: Mean gradient elevated on recent echoes, 26 mmHg on  8/23 echo up from 21 mmHg on the prior.  This probably plays a role in CHF.  She is on warfarin with INR goal 2.5-3.5, and ASA. - I do not think that there is a  role here for close investigation of aortic valve by TEE or CTA.  She has had an elevated gradient 20+ on last 2 echoes, do not think she would be a candidate for redo valve replacement and we cannot anticoagulate her more aggressively.  6. Pulmonary hypertension: Moderate pulmonary hypertension on 9/19 with RV dysfunction and dilation noted on last echo. PVR only 3.1 WU but cardiac output was high.  V/Q scan in 10/19 with no evidence for chronic PEs and PFTs in 10/19 and again in 7/20 were unremarkable except for low DLCO.  I think there is a possible group 1 PH component though this is a borderline call as PVR is not significantly elevated. ANA and RF positive, as is CCP Ab.  She saw rheumatology and per her report, was not thought to have rheumatoid arthritis.  Subsequently, she has been diagnosed with p-ANCA+ vasculitis. CT chest showed subtle bilateral ground glass opacities in 2020, she is followed by pulmonary.  Recent repeat echo in 8/23 showed normal RV and estimated PA systolic pressure 42 mmHg mmHg.  - Continue Adcirca 40 mg daily.  It was borderline to start this, not sure she has had marked benefit.  Will hold off on starting a 2nd PH medication. 7. Rheumatology: Joint pain, positive RF and CCP Ab.  Lung findings (ground glass, pulmonary hypertension) also thought to be concerning for RA. However, per patient's report, she was by her rheumatologist that did not have RA and did have gout.  More recently, she has been found to have p-ANCA+ vasculitis, eosinophilic granulomatosis with polyangiitis.  She was treated with rituximab and prednisone by Dr Darnelle Catalan.  Now off prednisone. 8. HTN: BP controlled on current regimen.  - No change 9. CKD: Stage 4.  She would be a poor candidate for HD. She has P-ANCA vasculitis.  - Followed by Dr. Kathrene Bongo.  - Cr stable 2.72 on 06/18/23 10. Left Carotid stenosis: Has been seen by [facility].  - Not a candidate for TCAR 11. Multifactorial anemia: Followed by  hematology - Hgb 10.8 on 06/18/23  Followup in 6 months with Dr. Shirlee Latch  Signed, Jacklynn Ganong, FNP  06/26/2023   [facility] [address] [facility] [place] [address] [phone] (office) [phone] (fax)

## 2023-06-26 ENCOUNTER — Ambulatory Visit (HOSPITAL_COMMUNITY)
Admission: RE | Admit: 2023-06-26 | Discharge: 2023-06-26 | Disposition: A | Discharge status: Home / Self Care | Payer: Medicare HMO | Source: Ambulatory Visit | Attending: Family Medicine | Admitting: Family Medicine

## 2023-06-26 ENCOUNTER — Encounter (HOSPITAL_COMMUNITY): Payer: Self-pay

## 2023-06-26 VITALS — BP 118/56 | HR 56 | Wt 109.8 lb

## 2023-06-26 DIAGNOSIS — M109 Gout, unspecified: Secondary | ICD-10-CM | POA: Diagnosis not present

## 2023-06-26 DIAGNOSIS — I10 Essential (primary) hypertension: Secondary | ICD-10-CM

## 2023-06-26 DIAGNOSIS — N184 Chronic kidney disease, stage 4 (severe): Secondary | ICD-10-CM | POA: Insufficient documentation

## 2023-06-26 DIAGNOSIS — Z79899 Other long term (current) drug therapy: Secondary | ICD-10-CM | POA: Insufficient documentation

## 2023-06-26 DIAGNOSIS — I4439 Other atrioventricular block: Secondary | ICD-10-CM | POA: Insufficient documentation

## 2023-06-26 DIAGNOSIS — Z7951 Long term (current) use of inhaled steroids: Secondary | ICD-10-CM | POA: Insufficient documentation

## 2023-06-26 DIAGNOSIS — E1122 Type 2 diabetes mellitus with diabetic chronic kidney disease: Secondary | ICD-10-CM | POA: Insufficient documentation

## 2023-06-26 DIAGNOSIS — I2510 Atherosclerotic heart disease of native coronary artery without angina pectoris: Secondary | ICD-10-CM | POA: Diagnosis not present

## 2023-06-26 DIAGNOSIS — Z7982 Long term (current) use of aspirin: Secondary | ICD-10-CM | POA: Diagnosis not present

## 2023-06-26 DIAGNOSIS — I4811 Longstanding persistent atrial fibrillation: Secondary | ICD-10-CM | POA: Diagnosis not present

## 2023-06-26 DIAGNOSIS — I7782 Antineutrophilic cytoplasmic antibody (ANCA) vasculitis: Secondary | ICD-10-CM | POA: Diagnosis not present

## 2023-06-26 DIAGNOSIS — N3000 Acute cystitis without hematuria: Secondary | ICD-10-CM | POA: Diagnosis not present

## 2023-06-26 DIAGNOSIS — I739 Peripheral vascular disease, unspecified: Secondary | ICD-10-CM | POA: Insufficient documentation

## 2023-06-26 DIAGNOSIS — Z952 Presence of prosthetic heart valve: Secondary | ICD-10-CM | POA: Diagnosis not present

## 2023-06-26 DIAGNOSIS — Z794 Long term (current) use of insulin: Secondary | ICD-10-CM | POA: Diagnosis not present

## 2023-06-26 DIAGNOSIS — Z951 Presence of aortocoronary bypass graft: Secondary | ICD-10-CM | POA: Diagnosis not present

## 2023-06-26 DIAGNOSIS — M301 Polyarteritis with lung involvement [Churg-Strauss]: Secondary | ICD-10-CM | POA: Diagnosis not present

## 2023-06-26 DIAGNOSIS — I5032 Chronic diastolic (congestive) heart failure: Secondary | ICD-10-CM | POA: Diagnosis not present

## 2023-06-26 DIAGNOSIS — I130 Hypertensive heart and chronic kidney disease with heart failure and stage 1 through stage 4 chronic kidney disease, or unspecified chronic kidney disease: Secondary | ICD-10-CM | POA: Insufficient documentation

## 2023-06-26 DIAGNOSIS — Z87891 Personal history of nicotine dependence: Secondary | ICD-10-CM | POA: Insufficient documentation

## 2023-06-26 DIAGNOSIS — D631 Anemia in chronic kidney disease: Secondary | ICD-10-CM | POA: Insufficient documentation

## 2023-06-26 DIAGNOSIS — Z8711 Personal history of peptic ulcer disease: Secondary | ICD-10-CM | POA: Insufficient documentation

## 2023-06-26 DIAGNOSIS — I4510 Unspecified right bundle-branch block: Secondary | ICD-10-CM | POA: Insufficient documentation

## 2023-06-26 DIAGNOSIS — I4892 Unspecified atrial flutter: Secondary | ICD-10-CM | POA: Diagnosis not present

## 2023-06-26 DIAGNOSIS — E1151 Type 2 diabetes mellitus with diabetic peripheral angiopathy without gangrene: Secondary | ICD-10-CM | POA: Diagnosis not present

## 2023-06-26 DIAGNOSIS — I480 Paroxysmal atrial fibrillation: Secondary | ICD-10-CM | POA: Diagnosis not present

## 2023-06-26 DIAGNOSIS — Z7901 Long term (current) use of anticoagulants: Secondary | ICD-10-CM | POA: Diagnosis not present

## 2023-06-26 DIAGNOSIS — I272 Pulmonary hypertension, unspecified: Secondary | ICD-10-CM | POA: Insufficient documentation

## 2023-06-26 DIAGNOSIS — R600 Localized edema: Secondary | ICD-10-CM | POA: Insufficient documentation

## 2023-06-26 DIAGNOSIS — I6522 Occlusion and stenosis of left carotid artery: Secondary | ICD-10-CM | POA: Insufficient documentation

## 2023-06-26 DIAGNOSIS — D649 Anemia, unspecified: Secondary | ICD-10-CM

## 2023-06-26 NOTE — Patient Instructions (Signed)
There has been no changes to your medications.  Your provider has ordered an ultra sound of your legs at the [place] office. You will be called to have this test done.  Your physician recommends that you schedule a follow-up appointment in: 6 months with Dr. Shirlee Latch (March 2025) ** PLEASE CALL THE OFFICE IN [place] TO ARRANGE YOUR FOLLOW UP APPOINTMENT. **  If you have any questions or concerns before your next appointment please send [place] a message through [place] or call our office at [phone].    TO LEAVE A MESSAGE FOR THE NURSE SELECT OPTION 2, PLEASE LEAVE A MESSAGE INCLUDING: YOUR NAME DATE OF BIRTH CALL BACK NUMBER REASON FOR CALL**this is important as we prioritize the call backs  YOU WILL RECEIVE A CALL BACK THE SAME DAY AS LONG AS YOU CALL BEFORE 4:00 PM  At the [facility], you and your health needs are our priority. As part of our continuing mission to provide you with exceptional heart care, we have created designated Provider Care Teams. These Care Teams include your primary Cardiologist (physician) and Advanced Practice Providers (APPs- Physician Assistants and Nurse Practitioners) who all work together to provide you with the care you need, when you need it.   You may see any of the following providers on your designated Care Team at your next follow up: Dr Arvilla Meres Dr Marca Ancona Dr. Marcos Eke, NP Robbie Lis, [facility] [place], [place] Brynda Peon, NP Karle Plumber, PharmD   Please be sure to bring in all your medications bottles to every appointment.    Thank you for choosing [facility]

## 2023-06-27 ENCOUNTER — Ambulatory Visit: Payer: Medicare HMO | Attending: Cardiology | Admitting: *Deleted

## 2023-06-27 DIAGNOSIS — Z952 Presence of prosthetic heart valve: Secondary | ICD-10-CM

## 2023-06-27 DIAGNOSIS — Z5181 Encounter for therapeutic drug level monitoring: Secondary | ICD-10-CM | POA: Diagnosis not present

## 2023-06-27 DIAGNOSIS — M542 Cervicalgia: Secondary | ICD-10-CM | POA: Diagnosis not present

## 2023-06-27 LAB — POCT INR: POC INR: 1.4

## 2023-06-27 NOTE — Patient Instructions (Signed)
Description   Take 1.5 tablets of warfarin today and tomorrow then continue warfarin 1 tablet (5mg  daily) daily.  Recheck in 1 wk If starting any new medications then please call.

## 2023-06-28 ENCOUNTER — Ambulatory Visit: Payer: Self-pay

## 2023-06-28 NOTE — Patient Instructions (Addendum)
Visit Information  Thank you for taking time to visit with me today. Please don't hesitate to contact me if I can be of assistance to you.   Following are the goals we discussed today:   Goals Addressed             This Visit's Progress    To recover from UTI   On track    Care Coordination Interventions: Completed successful outbound call with patient and daughter [name]  Evaluation of current treatment plan related to UTI and patient's adherence to plan as established by provider Determined patient is currently dealing with a recurrent UTI, she completed a visit with [facility] on 06/26/23 and is awaiting on a call back to advise of the recommended treatment for UTI Determined patient is experiencing some lower abdominal pain, hematuria and incontinence Educated on the importance of staying well hydrated with water and patient/daughter feel she is staying adequately hydrated  Discussed daughter [name] will contact [facility] today to ask about the plan to treat patient's UTI        Our next appointment is by telephone on 07/12/23 at 1:00 PM   Please call the care guide team at [phone] if you need to cancel or reschedule your appointment.   If you are experiencing a Mental Health or Behavioral Health Crisis or need someone to talk to, please call 1-800-273-TALK (toll free, 24 hour hotline)  Patient verbalizes understanding of instructions and care plan provided today and agrees to view in MyChart. Active MyChart status and patient understanding of how to access instructions and care plan via MyChart confirmed with patient.     Delsa Sale RN BSN CCM [place]  [facility], [facility] Nurse Care Coordinator  Direct Dial: [phone] Website: [email or web address]

## 2023-06-28 NOTE — Patient Outreach (Signed)
Care Coordination   Follow Up Visit Note   06/28/2023 Name: [name] MRN: [id number] DOB: 13-May-1941  [name] is a 82 y.o. year old female who sees [name], [name], MD for primary care. I spoke with  [name] by phone today.  What matters to the patients health and wellness today?  Patient would like to have her UTI resolve without complications.     Goals Addressed             This Visit's Progress    To recover from UTI   On track    Care Coordination Interventions: Completed successful outbound call with patient and daughter [name]  Evaluation of current treatment plan related to UTI and patient's adherence to plan as established by provider Determined patient is currently dealing with a recurrent UTI, she completed a visit with [facility] on 06/26/23 and is awaiting on a call back to advise of the recommended treatment for UTI Determined patient is experiencing some lower abdominal pain, hematuria and incontinence Educated on the importance of staying well hydrated with water and patient/daughter feel she is staying adequately hydrated  Discussed [name] will contact [facility] today to ask about the plan to treat patient's UTI    Interventions Today    Flowsheet Row Most Recent Value  Chronic Disease   Chronic disease during today's visit Other  [Impaired urinary elimination]  General Interventions   General Interventions Discussed/Reviewed General Interventions Discussed, General Interventions Reviewed, Labs, Doctor Visits  Doctor Visits Discussed/Reviewed Doctor Visits Discussed, Doctor Visits Reviewed, Specialist  Education Interventions   Education Provided Provided Education  Provided Verbal Education On Nutrition, When to see the doctor, Medication  Nutrition Interventions   Nutrition Discussed/Reviewed Nutrition Discussed, Nutrition Reviewed, Fluid intake  Pharmacy Interventions   Pharmacy Dicussed/Reviewed Pharmacy Topics  Reviewed, Pharmacy Topics Discussed, Medications and their functions          SDOH assessments and interventions completed:  No     Care Coordination Interventions:  Yes, provided   Follow up plan: Follow up call scheduled for 07/12/23 @1 :00 PM     Encounter Outcome:  Patient Visit Completed

## 2023-07-03 ENCOUNTER — Inpatient Hospital Stay: Payer: Medicare HMO

## 2023-07-03 VITALS — BP 135/45 | HR 57 | Temp 98.0°F | Resp 18

## 2023-07-03 DIAGNOSIS — N184 Chronic kidney disease, stage 4 (severe): Secondary | ICD-10-CM | POA: Diagnosis not present

## 2023-07-03 DIAGNOSIS — D508 Other iron deficiency anemias: Secondary | ICD-10-CM

## 2023-07-03 DIAGNOSIS — D638 Anemia in other chronic diseases classified elsewhere: Secondary | ICD-10-CM

## 2023-07-03 DIAGNOSIS — D631 Anemia in chronic kidney disease: Secondary | ICD-10-CM | POA: Diagnosis not present

## 2023-07-03 DIAGNOSIS — D500 Iron deficiency anemia secondary to blood loss (chronic): Secondary | ICD-10-CM

## 2023-07-03 DIAGNOSIS — D591 Autoimmune hemolytic anemia, unspecified: Secondary | ICD-10-CM

## 2023-07-03 LAB — CBC WITH DIFFERENTIAL/PLATELET
Abs Immature Granulocytes: 0.01 10*3/uL (ref 0.00–0.07)
Basophils Absolute: 0 10*3/uL (ref 0.0–0.1)
Basophils Relative: 1 %
Eosinophils Absolute: 0.4 10*3/uL (ref 0.0–0.5)
Eosinophils Relative: 8 %
HCT: 34 % — ABNORMAL LOW (ref 36.0–46.0)
Hemoglobin: 10.9 g/dL — ABNORMAL LOW (ref 12.0–15.0)
Immature Granulocytes: 0 %
Lymphocytes Relative: 24 %
Lymphs Abs: 1.2 10*3/uL (ref 0.7–4.0)
MCH: 30 pg (ref 26.0–34.0)
MCHC: 32.1 g/dL (ref 30.0–36.0)
MCV: 93.7 fL (ref 80.0–100.0)
Monocytes Absolute: 0.4 10*3/uL (ref 0.1–1.0)
Monocytes Relative: 9 %
Neutro Abs: 2.9 10*3/uL (ref 1.7–7.7)
Neutrophils Relative %: 58 %
Platelets: 96 10*3/uL — ABNORMAL LOW (ref 150–400)
RBC: 3.63 MIL/uL — ABNORMAL LOW (ref 3.87–5.11)
RDW: 14.8 % (ref 11.5–15.5)
WBC: 4.9 10*3/uL (ref 4.0–10.5)
nRBC: 0 % (ref 0.0–0.2)

## 2023-07-03 LAB — COMPREHENSIVE METABOLIC PANEL
ALT: 17 U/L (ref 0–44)
AST: 31 U/L (ref 15–41)
Albumin: 3.9 g/dL (ref 3.5–5.0)
Alkaline Phosphatase: 118 U/L (ref 38–126)
Anion gap: 9 (ref 5–15)
BUN: 86 mg/dL — ABNORMAL HIGH (ref 8–23)
CO2: 25 mmol/L (ref 22–32)
Calcium: 9.4 mg/dL (ref 8.9–10.3)
Chloride: 101 mmol/L (ref 98–111)
Creatinine, Ser: 2.56 mg/dL — ABNORMAL HIGH (ref 0.44–1.00)
GFR, Estimated: 18 mL/min — ABNORMAL LOW (ref >60)
Glucose, Bld: 140 mg/dL — ABNORMAL HIGH (ref 70–99)
Potassium: 4.7 mmol/L (ref 3.5–5.1)
Sodium: 135 mmol/L (ref 135–145)
Total Bilirubin: 0.4 mg/dL (ref 0.3–1.2)
Total Protein: 6.1 g/dL — ABNORMAL LOW (ref 6.5–8.1)

## 2023-07-03 LAB — SAMPLE TO BLOOD BANK

## 2023-07-03 MED ORDER — EPOETIN ALFA-EPBX 40000 UNIT/ML IJ SOLN: 40000.0000 [IU] | Freq: Once | INTRAMUSCULAR | Status: DC

## 2023-07-03 NOTE — Progress Notes (Signed)
Patient here for retacrit, Hgb 10.9. Injection is to be held today since it's >10 per [place], [facility].

## 2023-07-04 ENCOUNTER — Ambulatory Visit: Payer: Medicare HMO | Admitting: Podiatry

## 2023-07-04 ENCOUNTER — Encounter: Payer: Self-pay | Admitting: Podiatry

## 2023-07-04 DIAGNOSIS — E1151 Type 2 diabetes mellitus with diabetic peripheral angiopathy without gangrene: Secondary | ICD-10-CM | POA: Diagnosis not present

## 2023-07-04 DIAGNOSIS — M79675 Pain in left toe(s): Secondary | ICD-10-CM | POA: Diagnosis not present

## 2023-07-04 DIAGNOSIS — M79674 Pain in right toe(s): Secondary | ICD-10-CM | POA: Diagnosis not present

## 2023-07-04 DIAGNOSIS — B351 Tinea unguium: Secondary | ICD-10-CM

## 2023-07-04 NOTE — Progress Notes (Signed)
This patient returns to my office for at risk foot care.  This patient requires this care by a professional since this patient will be at risk due to having diabetes.This patient is unable to cut nails herself since the patient cannot reach her nails.These nails are painful walking and wearing shoes.  This patient presents for at risk foot care today.  General Appearance  Alert, conversant and in no acute stress.  Vascular  Dorsalis pedis and posterior tibial  pulses are  weakly palpable  bilaterally.  Capillary return is within normal limits  bilaterally. Temperature is within normal limits  bilaterally.  Neurologic  Senn-Weinstein monofilament wire test within normal limits  bilaterally. Muscle power within normal limits bilaterally.  Nails Thick disfigured discolored nails with subungual debris  from hallux to fifth toes bilaterally. No evidence of bacterial infection or drainage bilaterally.  Orthopedic  No limitations of motion  feet .  No crepitus or effusions noted.  No bony pathology or digital deformities noted.  Skin  normotropic skin with no porokeratosis noted bilaterally.  No signs of infections or ulcers noted.     Onychomycosis  Pain in right toes  Pain in left toes  Consent was obtained for treatment procedures.   Mechanical debridement of nails 1-5  bilaterally performed with a nail nipper.  Filed with dremel without incident.    Return office visit   3 months                   Told patient to return for periodic foot care and evaluation due to potential at risk complications.   Helane Gunther DPM

## 2023-07-05 ENCOUNTER — Ambulatory Visit: Payer: Medicare HMO | Attending: Cardiology

## 2023-07-05 DIAGNOSIS — Z5181 Encounter for therapeutic drug level monitoring: Secondary | ICD-10-CM | POA: Diagnosis not present

## 2023-07-05 DIAGNOSIS — Z952 Presence of prosthetic heart valve: Secondary | ICD-10-CM

## 2023-07-05 LAB — POCT INR: INR: 1.4 — AB (ref 2.0–3.0)

## 2023-07-05 NOTE — Patient Instructions (Signed)
Description   Take 1.5 tablets of warfarin today and 1.5 tablets tomorrow and then START warfarin 1 tablet (5mg  daily) daily except 1.5 tablets on Mondays.  Stay consistent with protein drinks and greens each week.  Recheck in 1 week  If starting any new medications then please call.

## 2023-07-11 ENCOUNTER — Ambulatory Visit: Payer: Medicare HMO | Attending: Cardiology

## 2023-07-11 DIAGNOSIS — Z5181 Encounter for therapeutic drug level monitoring: Secondary | ICD-10-CM | POA: Diagnosis not present

## 2023-07-11 DIAGNOSIS — Z952 Presence of prosthetic heart valve: Secondary | ICD-10-CM | POA: Diagnosis not present

## 2023-07-11 LAB — POCT INR: INR: 1.4 — AB (ref 2.0–3.0)

## 2023-07-11 NOTE — Patient Instructions (Signed)
Description   Take 2 tablets of warfarin today and then START warfarin 1 tablet (5mg  daily) daily except 2 tablets on Mondays and Fridays.  Stay consistent with Boost drinks (1/2 bottle each day).  Recheck in 1 week  If starting any new medications then please call.

## 2023-07-12 ENCOUNTER — Ambulatory Visit: Payer: Self-pay

## 2023-07-12 DIAGNOSIS — N3000 Acute cystitis without hematuria: Secondary | ICD-10-CM | POA: Diagnosis not present

## 2023-07-12 DIAGNOSIS — R350 Frequency of micturition: Secondary | ICD-10-CM | POA: Diagnosis not present

## 2023-07-12 NOTE — Patient Instructions (Signed)
Visit Information  Thank you for taking time to visit with me today. Please don't hesitate to contact me if I can be of assistance to you.   Following are the goals we discussed today:   Goals Addressed             This Visit's Progress    To recover from UTI       Care Coordination Interventions: Completed successful outbound call with patient and daughter Dixie Sink  Evaluation of current treatment plan related to UTI and patient's adherence to plan as established by provider Discussed patient completed a full course of antibiotics, unfortunately she continues to experience hematuria and abdominal pain  Determined patient is on her way to the Urologist now to see Dr. Sherron Monday for further evaluation  Reinforced the importance of patient increasing her daily water intake, encouraged patient to discuss this further with her Urologist         Our next appointment is by telephone on 07/25/23 at 12:00 PM  Please call the care guide team at [phone] if you need to cancel or reschedule your appointment.   If you are experiencing a Mental Health or Behavioral Health Crisis or need someone to talk to, please call 1-800-273-TALK (toll free, 24 hour hotline)  Patient verbalizes understanding of instructions and care plan provided today and agrees to view in MyChart. Active MyChart status and patient understanding of how to access instructions and care plan via MyChart confirmed with patient.     Delsa Sale RN BSN [facility] [place]  [facility], [facility] Nurse Care Coordinator  Direct Dial: [phone] Website: [email or web address]

## 2023-07-12 NOTE — Patient Outreach (Signed)
Care Coordination   Follow Up Visit Note   07/12/2023 Name: [name] MRN: [id number] DOB: 16-Jul-1941  [name] is a 82 y.o. year old female who sees [name], [name], MD for primary care. I spoke with  [name] by phone today.  What matters to the patients health and wellness today?  Patient would like to recover from her UTI without complications.     Goals Addressed             This Visit's Progress    To recover from UTI       Care Coordination Interventions: Completed successful outbound call with patient and daughter  Sink  Evaluation of current treatment plan related to UTI and patient's adherence to plan as established by provider Discussed patient completed a full course of antibiotics, unfortunately she continues to experience hematuria and abdominal pain  Determined patient is on her way to the Urologist now to see Dr. Sherron Monday for further evaluation  Reinforced the importance of patient increasing her daily water intake, encouraged patient to discuss this further with her Urologist     Interventions Today    Flowsheet Row Most Recent Value  Chronic Disease   Chronic disease during today's visit Other  [impaired urinary elimination]  General Interventions   General Interventions Discussed/Reviewed General Interventions Discussed, General Interventions Reviewed, Doctor Visits  Doctor Visits Discussed/Reviewed Doctor Visits Reviewed, Doctor Visits Discussed, Specialist  Education Interventions   Education Provided Provided Education  Provided Verbal Education On Nutrition, When to see the doctor  Nutrition Interventions   Nutrition Discussed/Reviewed Nutrition Discussed, Nutrition Reviewed, Fluid intake  Pharmacy Interventions   Pharmacy Dicussed/Reviewed Pharmacy Topics Discussed, Pharmacy Topics Reviewed, Medications and their functions          SDOH assessments and interventions completed:  No     Care Coordination Interventions:   Yes, provided   Follow up plan: Follow up call scheduled for 07/25/23 @12 :00 PM    Encounter Outcome:  Patient Visit Completed

## 2023-07-17 ENCOUNTER — Inpatient Hospital Stay: Payer: Medicare HMO | Attending: Oncology | Admitting: Adult Health

## 2023-07-17 ENCOUNTER — Inpatient Hospital Stay: Payer: Medicare HMO

## 2023-07-17 ENCOUNTER — Other Ambulatory Visit: Payer: Self-pay

## 2023-07-17 ENCOUNTER — Encounter: Payer: Self-pay | Admitting: Adult Health

## 2023-07-17 VITALS — BP 145/52 | HR 40 | Temp 97.7°F | Resp 18 | Wt 110.3 lb

## 2023-07-17 DIAGNOSIS — D638 Anemia in other chronic diseases classified elsewhere: Secondary | ICD-10-CM

## 2023-07-17 DIAGNOSIS — D591 Autoimmune hemolytic anemia, unspecified: Secondary | ICD-10-CM | POA: Insufficient documentation

## 2023-07-17 DIAGNOSIS — M8580 Other specified disorders of bone density and structure, unspecified site: Secondary | ICD-10-CM | POA: Insufficient documentation

## 2023-07-17 DIAGNOSIS — R001 Bradycardia, unspecified: Secondary | ICD-10-CM | POA: Diagnosis not present

## 2023-07-17 DIAGNOSIS — D631 Anemia in chronic kidney disease: Secondary | ICD-10-CM | POA: Insufficient documentation

## 2023-07-17 DIAGNOSIS — N184 Chronic kidney disease, stage 4 (severe): Secondary | ICD-10-CM | POA: Diagnosis not present

## 2023-07-17 DIAGNOSIS — I7782 Antineutrophilic cytoplasmic antibody (ANCA) vasculitis: Secondary | ICD-10-CM | POA: Diagnosis not present

## 2023-07-17 DIAGNOSIS — D508 Other iron deficiency anemias: Secondary | ICD-10-CM

## 2023-07-17 DIAGNOSIS — D500 Iron deficiency anemia secondary to blood loss (chronic): Secondary | ICD-10-CM

## 2023-07-17 LAB — COMPREHENSIVE METABOLIC PANEL
ALT: 21 U/L (ref 0–44)
AST: 35 U/L (ref 15–41)
Albumin: 4.1 g/dL (ref 3.5–5.0)
Alkaline Phosphatase: 126 U/L (ref 38–126)
Anion gap: 9 (ref 5–15)
BUN: 98 mg/dL — ABNORMAL HIGH (ref 8–23)
CO2: 24 mmol/L (ref 22–32)
Calcium: 9.6 mg/dL (ref 8.9–10.3)
Chloride: 101 mmol/L (ref 98–111)
Creatinine, Ser: 2.78 mg/dL — ABNORMAL HIGH (ref 0.44–1.00)
GFR, Estimated: 16 mL/min — ABNORMAL LOW (ref >60)
Glucose, Bld: 120 mg/dL — ABNORMAL HIGH (ref 70–99)
Potassium: 4.6 mmol/L (ref 3.5–5.1)
Sodium: 134 mmol/L — ABNORMAL LOW (ref 135–145)
Total Bilirubin: 0.5 mg/dL (ref 0.3–1.2)
Total Protein: 6.4 g/dL — ABNORMAL LOW (ref 6.5–8.1)

## 2023-07-17 LAB — CBC WITH DIFFERENTIAL/PLATELET
Abs Immature Granulocytes: 0.01 10*3/uL (ref 0.00–0.07)
Basophils Absolute: 0.1 10*3/uL (ref 0.0–0.1)
Basophils Relative: 1 %
Eosinophils Absolute: 0.4 10*3/uL (ref 0.0–0.5)
Eosinophils Relative: 7 %
HCT: 30.6 % — ABNORMAL LOW (ref 36.0–46.0)
Hemoglobin: 10 g/dL — ABNORMAL LOW (ref 12.0–15.0)
Immature Granulocytes: 0 %
Lymphocytes Relative: 24 %
Lymphs Abs: 1.2 10*3/uL (ref 0.7–4.0)
MCH: 30.1 pg (ref 26.0–34.0)
MCHC: 32.7 g/dL (ref 30.0–36.0)
MCV: 92.2 fL (ref 80.0–100.0)
Monocytes Absolute: 0.6 10*3/uL (ref 0.1–1.0)
Monocytes Relative: 12 %
Neutro Abs: 2.8 10*3/uL (ref 1.7–7.7)
Neutrophils Relative %: 56 %
Platelets: 99 10*3/uL — ABNORMAL LOW (ref 150–400)
RBC: 3.32 MIL/uL — ABNORMAL LOW (ref 3.87–5.11)
RDW: 14.6 % (ref 11.5–15.5)
WBC: 5 10*3/uL (ref 4.0–10.5)
nRBC: 0 % (ref 0.0–0.2)

## 2023-07-17 LAB — SAMPLE TO BLOOD BANK

## 2023-07-17 MED ORDER — EPOETIN ALFA-EPBX 40000 UNIT/ML IJ SOLN
40000.0000 [IU] | Freq: Once | INTRAMUSCULAR | Status: AC
  Administered 2023-07-17: 40000 [IU] via SUBCUTANEOUS
  Filled 2023-07-17: qty 1

## 2023-07-17 NOTE — Assessment & Plan Note (Signed)
[name] is here for follow-up of her multifactorial anemia, anemia of chronic disease.  She continues on retacrit 40,000 units every 2 weeks for a hemoglobin less than  or equal to 10.    Will proceed with Retacrit today her hemoglobin is 10.0.  She is tolerating the Retacrit well.  Bradycardia: I obtained EKG and reached out to Dr. Shirlee Latch who reviewed it.  He informed me that patient appears to have sinus bradycardia and he recommended discontinuing her metoprolol which I did today.  I gave her written instructions in her after visit summary to stop taking the metoprolol and I let her know that Dr. Alford Highland office would reach out to her for sooner follow-up than when she is scheduled to see him in 5 months.  I sent a message to Julious Payer and MR call her in to request scheduling of this appointment at Dr. Alford Highland request.  She will return in 2 weeks for lab and injection and 4 weeks for lab, f/u, and injection.

## 2023-07-17 NOTE — Progress Notes (Signed)
[name]:    [name], [name] Koh, MD [address] [place] [address]   DIAGNOSIS: Multifactorial Anemia  SUMMARY OF HEMATOLOGIC HISTORY: with multifactorial anemia, as follows:             (a) anemia of chronic inflammation: As of May 2020 she has a SED rate of 95, CRP 1.1, positive ANA and RF; subsequently she was found to be ANCA positive             (b) hemolysis (mild): she has a mildly elevated LDH and a DAT positive for complement, not IgG; this is c/w (a) above             (c) anemia of renal insufficiency: inadequate EPO resonse to anemia                         (d) GIB/ iron deficiency in patient on lifelong anticoagulaton   (1) Feraheme received 03/01/2019, repeated 03/20/2019              (a) reticulocyte 03/07/2019 up from 43.4 a year ago to 191.8 after iron infusion             (b) feraheme repeated on 05/12/2019             (c) subsequent ferritin levels >100             (D) Ferrliceit 250 mg last given 06/25/22   (2) darbepoetin: starting 05/05/2019 at given every 2 weeks             (a) dose increased to 200 mcg every week starting 07/01/2019              (b) back to every 2 weeks beginning 08/04/2019 still at 200 mcg dose             (c) switched to Retacrit every 4 weeks as of 08/18/2019   (3) ANCA positive vasculitis: diagnosed 05/08/2019 (titer 1:640)             (a) prednisone 40 mg daily started 05/31/2019             (b) rituximab weekly started 06/12/2019 (received test dose 06/05/2019)                         (i) hepatitis B sAg, core Ab and HIV negative 05/08/2019                         (ii) rituximab held after 07/14/2019 dose (received 5 weekly doses) with neutropenia                         (iii) rituximab resumed 08/18/2019 but changed to monthly                         (iv) rituximab changed to every 2 months beginning with 02/02/2020 dose                         (v) rituximab changed to every 12 weeks  after the 10/12/2020 dose             (c) prednisone tapered to off as of 09/15/2019                          (  4) Anemia worsened in spring 2023, refractory to Rituximab and requiring prednisone restart with slow taper. This was completed in March 2024 per chart review.   (5) Bone marrow biopsy 02/20/2022: normocellular marrow with trilineage hematopoiesis, CT C/A/P on the same day did not show an underlying cause for anemia.     (5) osteopenia with a T score of -1.5 on bone density 02/28/2016 at the [facility] center  CURRENT THERAPY: Retacrit  INTERVAL HISTORY: [name] 82 y.o. female returns for follow-up and evaluation of her multifactorial anemia.  She has not undergone any lab testing prior to today's visit.  Her most recent Retacrit injection occurred on May 25, 2023.  Her most recent hemoglobin on July 03, 2023 was 10.9.  She has some mild fatigue.  Her heart rate is 41 today.  She denies any chest pain, shortness of breath, lightheadedness, or other concerns.     Patient Active Problem List   Diagnosis Date Noted   Chronic cough 03/26/2023   Protein-calorie malnutrition, severe 12/26/2022   Acute GI bleeding 12/24/2022   Coagulopathy (HCC) 12/24/2022   Melena 12/24/2022   Duodenitis 12/24/2022   Noninfectious gastroenteritis 12/24/2022   CAD, multiple vessel 12/24/2022   Acute on chronic heart failure with preserved ejection fraction (HFpEF) (HCC) 08/29/2022   Acute exacerbation of CHF (congestive heart failure) (HCC) 08/29/2022   Acute urinary retention 08/28/2022   Acute kidney injury superimposed on chronic kidney disease (HCC) 08/27/2022   Constipation 08/27/2022   Nausea and vomiting 08/27/2022   Hypoalbuminemia due to protein-calorie malnutrition (HCC) 08/27/2022   Type 2 diabetes mellitus with hyperglycemia (HCC) 08/27/2022   Palliative care encounter    Goals of care, counseling/discussion    Encounter for therapeutic drug monitoring    GI bleed  07/09/2022   GI bleeding 07/08/2022   Acute on chronic anemia 07/08/2022   History of Clostridioides difficile colitis 07/08/2022   DNR (do not resuscitate)/DNI(Do Not Intubate) 07/08/2022   Anemia of chronic disease 07/07/2022   Pressure injury of skin 06/23/2022   UTI (urinary tract infection) 06/22/2022   Joint pain 06/09/2022   Parietoalveolar pneumopathy (HCC) 06/09/2022   Primary osteoarthritis 06/09/2022   Rheumatoid factor positive 06/09/2022   C. difficile colitis 04/19/2022   Colitis 04/18/2022   PAH (pulmonary artery hypertension) (HCC) 04/18/2022   Emphysematous cystitis 04/13/2022   Female bladder prolapse 04/13/2022   Diverticulitis 04/12/2022   Autoimmune hemolytic anemia (HCC) 01/02/2022   Bilateral lower extremity edema 01/02/2022   Refractory anemia (HCC) 12/15/2021   Abnormal chest x-ray 12/15/2021   Supratherapeutic INR 12/15/2021   Elevated troponin 12/15/2021   Unilateral primary osteoarthritis, right hip 05/31/2021   Midline cystocele 05/30/2021   Primary localized osteoarthritis of pelvic region and thigh 05/30/2021   Vaginal pain 05/30/2021   Asymmetric SNHL (sensorineural hearing loss) 01/06/2021   Referred otalgia of both ears 01/06/2021   Bilateral hearing loss 08/31/2020   Low back pain 02/20/2020   CKD (chronic kidney disease), stage IV (HCC) 10/13/2019   Leukopenia due to antineoplastic chemotherapy (HCC) 08/18/2019   Thrombophilia (HCC) 06/19/2019   Encounter for general adult medical examination without abnormal findings 06/13/2019   Arteritis (HCC) 06/06/2019   Hypertensive heart and chronic kidney disease with heart failure and stage 1 through stage 4 chronic kidney disease, or unspecified chronic kidney disease (HCC) 06/06/2019   Muscle weakness 06/06/2019   Pancytopenia (HCC) 05/28/2019   Dyspnea 05/28/2019   Vasculitis, ANCA positive 05/28/2019   Disorder of connective tissue (HCC) 05/14/2019  Renal insufficiency 05/08/2019   PAF  (paroxysmal atrial fibrillation) (HCC) 04/07/2019   IDA (iron deficiency anemia) 02/28/2019   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 (HCC) 02/28/2019   Diabetic retinopathy associated with type 2 diabetes mellitus (HCC) 09/02/2018   Chronic pain 06/19/2018   Non-thrombocytopenic purpura (HCC) 05/21/2018   Gout 03/22/2018   Malnutrition of moderate degree 03/12/2018   Diabetes mellitus type 2, controlled (HCC) 03/06/2018   Chronic atrial fibrillation (HCC) 03/06/2018   H/O mechanical aortic valve replacement 03/06/2018   Moderate to severe pulmonary hypertension (HCC) 03/06/2018   GERD without esophagitis 02/21/2018   Anxiety 02/21/2018   Chronic diastolic CHF (congestive heart failure) (HCC) 02/21/2018   General weakness 02/02/2018   Thrombocytopenia (HCC) 01/25/2018   Arm pain, diffuse 01/25/2018   Anemia 01/23/2018   Aortic atherosclerosis (HCC) 01/23/2018   Age-related osteoporosis without current pathological fracture 05/24/2017   Carotid artery occlusion 05/24/2017   Generalized anxiety disorder 05/24/2017   Hearing loss 05/24/2017   Presence of prosthetic heart valve 05/24/2017   Carotid artery disease (HCC) 08/18/2013   Peripheral arterial disease (HCC) 08/18/2013   Diabetes mellitus without complication (HCC) 08/18/2013   Essential hypertension    Hypercholesteremia    Mechanical heart valve present    Chronic anticoagulation     is allergic to augmentin [amoxicillin-pot clavulanate], ciprofloxacin, codeine, coreg [carvedilol], diflucan [fluconazole], levofloxacin in d5w, losartan, privigen [immune globulin (human)], sulfamethoxazole-trimethoprim, verapamil, zetia [ezetimibe], zocor [simvastatin - high dose], and gatifloxacin.  MEDICAL HISTORY: Past Medical History:  Diagnosis Date   Aortic atherosclerosis (HCC) 01/23/2018   Atrial fibrillation (HCC)    Benign positional vertigo 10/06/2011   Carotid artery disease (HCC)    Chemotherapy induced  neutropenia (HCC) 07/21/2019   Cholelithiasis 01/23/2018   Cholelithiasis 01/23/2018   Chronic anticoagulation    Colovesical fistula, ruled out 04/14/2022   Coronary artery disease    status post coronary artery bypass grafting times 07/10/2004   Diabetes mellitus    Hypercholesteremia    Hypertension    Mechanical heart valve present    H. aortic valve replacement at the time of bypass surgery October 2005   Moderate to severe pulmonary hypertension (HCC)    Peripheral arterial disease (HCC)    history of left common iliac artery PTA and stenting for a chronic total occlusion 08/26/01   S/P cholecystectomy 03/06/2018    SURGICAL HISTORY: Past Surgical History:  Procedure Laterality Date   anterior repair  2009   AORTIC VALVE REPLACEMENT  2005   Dr. Laneta Simmers   CARDIAC CATHETERIZATION  11/10/2004   40% right common illiac, 70% in stent restenosis of distal left common illiac,    CARDIAC CATHETERIZATION  05/18/2004   LAD 50-70% midstenosis, RCA dominant w/50% stenosis, 50% Right common Illiac artery ostial stenosis, 90% in stent restenosis within midportion of left common illiac stent   Carotid Duplex  03/12/2012   RSA-elev. velocities suggestive of a 50-69% diameter reduction, Right&Left Bulb/Prox ICA-mild-mod.fibrous plaqueelevating Velocities abnormal study.   CHOLECYSTECTOMY N/A 03/01/2018   Procedure: LAPAROSCOPIC CHOLECYSTECTOMY;  Surgeon: Jimmye Norman, MD;  Location: [facility] OR;  Service: General;  Laterality: N/A;   COLONOSCOPY WITH PROPOFOL N/A 01/22/2018   Procedure: COLONOSCOPY WITH PROPOFOL;  Surgeon: Charlott Rakes, MD;  Location: [facility] ENDOSCOPY;  Service: Endoscopy;  Laterality: N/A;   ESOPHAGOGASTRODUODENOSCOPY N/A 07/13/2022   Procedure: ESOPHAGOGASTRODUODENOSCOPY (EGD);  Surgeon: Kerin Salen, MD;  Location: [facility] ENDOSCOPY;  Service: Gastroenterology;  Laterality: N/A;   ESOPHAGOGASTRODUODENOSCOPY (EGD) WITH PROPOFOL N/A 01/22/2018   Procedure:  ESOPHAGOGASTRODUODENOSCOPY (EGD)  WITH PROPOFOL;  Surgeon: Charlott Rakes, MD;  Location: [facility] ENDOSCOPY;  Service: Endoscopy;  Laterality: N/A;   ESOPHAGOGASTRODUODENOSCOPY (EGD) WITH PROPOFOL N/A 11/21/2018   Procedure: ESOPHAGOGASTRODUODENOSCOPY (EGD) WITH PROPOFOL;  Surgeon: Kerin Salen, MD;  Location: [facility] ENDOSCOPY;  Service: Gastroenterology;  Laterality: N/A;   ESOPHAGOGASTRODUODENOSCOPY (EGD) WITH PROPOFOL Left 02/27/2019   Procedure: ESOPHAGOGASTRODUODENOSCOPY (EGD) WITH PROPOFOL;  Surgeon: Willis Modena, MD;  Location: [facility] ENDOSCOPY;  Service: Endoscopy;  Laterality: Left;   GIVENS CAPSULE STUDY N/A 11/21/2018   Procedure: GIVENS CAPSULE STUDY;  Surgeon: Kerin Salen, MD;  Location: [facility] ENDOSCOPY;  Service: Gastroenterology;  Laterality: N/A;  To be deployed during EGD   Lower Ext. Duplex  03/12/2012   Right Proximal CIA- vessel narrowing w/elevated velocities 0-49% diameter reduction. Right SFA-mild mixed density plaque throughout vessel.   NM MYOCAR PERF WALL MOTION  05/19/2010   protocol: Persantine, post stress EF 65%, negative for ischemia, low risk scan   RIGHT HEART CATH N/A 06/27/2018   Procedure: RIGHT HEART CATH;  Surgeon: Laurey Morale, MD;  Location: [facility] INVASIVE CV LAB;  Service: Cardiovascular;  Laterality: N/A;   TOTAL ABDOMINAL HYSTERECTOMY W/ BILATERAL SALPINGOOPHORECTOMY  1989   TRANSTHORACIC ECHOCARDIOGRAM  08/29/2012   Moderately calcified annulus of mitral valve, moderate regurg. of both mitral valve and tricuspid valve.     SOCIAL HISTORY: Social History   Socioeconomic History   Marital status: Widowed    Spouse name: Not on file   Number of children: Not on file   Years of education: Not on file   Highest education level: Not on file  Occupational History   Occupation: Retired  Tobacco Use   Smoking status: Former    Current packs/day: 1.00    Average packs/day: 1 pack/day for 30.0 years (30.0 ttl pk-yrs)    Types: Cigarettes   Smokeless tobacco: Never   Tobacco comments:    quit  smoking 2005  Vaping Use   Vaping status: Never Used  Substance and Sexual Activity   Alcohol use: No    Alcohol/week: 0.0 standard drinks of alcohol   Drug use: No   Sexual activity: Not Currently    Birth control/protection: None  Other Topics Concern   Not on file  Social History Narrative   Not on file   Social Determinants of Health   Financial Resource Strain: Low Risk  (02/28/2022)   Overall Financial Resource Strain (CARDIA)    Difficulty of Paying Living Expenses: Not hard at all  Food Insecurity: No Food Insecurity (12/25/2022)   Hunger Vital Sign    Worried About Running Out of Food in the Last Year: Never true    Ran Out of Food in the Last Year: Never true  Transportation Needs: No Transportation Needs (12/25/2022)   PRAPARE - Administrator, Civil Service (Medical): No    Lack of Transportation (Non-Medical): No  Physical Activity: Inactive (02/28/2022)   Exercise Vital Sign    Days of Exercise per Week: 0 days    Minutes of Exercise per Session: 0 min  Stress: No Stress Concern Present (02/28/2022)   [employer] of Occupational Health - Occupational Stress Questionnaire    Feeling of Stress : Only a little  Social Connections: Moderately Integrated (06/22/2022)   Social Connection and Isolation Panel [NHANES]    Frequency of Communication with Friends and Family: More than three times a week    Frequency of Social Gatherings with Friends and Family: More than three times a  week    Attends Religious Services: More than 4 times per year    Active Member of Clubs or Organizations: Yes    Attends Banker Meetings: More than 4 times per year    Marital Status: Widowed  Intimate Partner Violence: Not At Risk (12/25/2022)   Humiliation, Afraid, Rape, and Kick questionnaire    Fear of Current or Ex-Partner: No    Emotionally Abused: No    Physically Abused: No    Sexually Abused: No    FAMILY HISTORY: Family History  Problem Relation  Age of Onset   Heart disease Father    Hypertension Father    Hyperlipidemia Father    Breast cancer Neg Hx     Review of Systems  Constitutional:  Positive for fatigue. Negative for appetite change, chills, fever and unexpected weight change.  HENT:   Negative for hearing loss, lump/mass and trouble swallowing.   Eyes:  Negative for eye problems and icterus.  Respiratory:  Negative for chest tightness, cough and shortness of breath.   Cardiovascular:  Positive for leg swelling (at baseline). Negative for chest pain and palpitations.  Gastrointestinal:  Negative for abdominal distention, abdominal pain, constipation, diarrhea, nausea and vomiting.  Endocrine: Negative for hot flashes.  Genitourinary:  Negative for difficulty urinating.   Musculoskeletal:  Negative for arthralgias.  Skin:  Negative for itching and rash.  Neurological:  Negative for dizziness, extremity weakness, headaches and numbness.  Hematological:  Negative for adenopathy. Bruises/bleeds easily (easy bruising on coumadin).  Psychiatric/Behavioral:  Negative for depression. The patient is not nervous/anxious.       PHYSICAL EXAMINATION  Vitals:   07/17/23 1102  BP: (!) 145/52  Pulse: (!) 40  Resp: 18  Temp: 97.7 F (36.5 C)  SpO2: 100%    Physical Exam Constitutional:      General: She is not in acute distress.    Appearance: Normal appearance. She is not toxic-appearing.  HENT:     Head: Normocephalic and atraumatic.     Mouth/Throat:     Mouth: Mucous membranes are moist.     Pharynx: Oropharynx is clear. No oropharyngeal exudate or posterior oropharyngeal erythema.  Eyes:     General: No scleral icterus. Cardiovascular:     Rate and Rhythm: Bradycardia present. Rhythm irregular.     Pulses: Normal pulses.     Heart sounds: Normal heart sounds.  Pulmonary:     Effort: Pulmonary effort is normal.     Breath sounds: Normal breath sounds.  Abdominal:     General: Abdomen is flat. Bowel sounds  are normal. There is no distension.     Palpations: Abdomen is soft.     Tenderness: There is no abdominal tenderness.  Musculoskeletal:        General: Swelling (+1) present.     Cervical back: Neck supple.  Lymphadenopathy:     Cervical: No cervical adenopathy.  Skin:    General: Skin is warm and dry.     Findings: No rash.  Neurological:     General: No focal deficit present.     Mental Status: She is alert.  Psychiatric:        Mood and Affect: Mood normal.        Behavior: Behavior normal.     LABORATORY DATA:  CBC    Component Value Date/Time   WBC 5.0 07/17/2023 1151   RBC 3.32 (L) 07/17/2023 1151   HGB 10.0 (L) 07/17/2023 1151   HGB 7.6 (L) 04/19/2023  1337   HCT 30.6 (L) 07/17/2023 1151   HCT 28.5 (L) 11/20/2018 1824   PLT 99 (L) 07/17/2023 1151   PLT 136 (L) 04/19/2023 1337   MCV 92.2 07/17/2023 1151   MCH 30.1 07/17/2023 1151   MCHC 32.7 07/17/2023 1151   RDW 14.6 07/17/2023 1151   LYMPHSABS 1.2 07/17/2023 1151   MONOABS 0.6 07/17/2023 1151   EOSABS 0.4 07/17/2023 1151   BASOSABS 0.1 07/17/2023 1151    CMP     Component Value Date/Time   NA 135 07/03/2023 1126   NA 137 12/06/2017 1436   K 4.7 07/03/2023 1126   CL 101 07/03/2023 1126   CO2 25 07/03/2023 1126   GLUCOSE 140 (H) 07/03/2023 1126   BUN 86 (H) 07/03/2023 1126   BUN 20 12/06/2017 1436   CREATININE 2.56 (H) 07/03/2023 1126   CREATININE 2.32 (H) 12/08/2022 1208   CALCIUM 9.4 07/03/2023 1126   PROT 6.1 (L) 07/03/2023 1126   ALBUMIN 3.9 07/03/2023 1126   AST 31 07/03/2023 1126   AST 60 (H) 12/08/2022 1208   ALT 17 07/03/2023 1126   ALT 42 12/08/2022 1208   ALKPHOS 118 07/03/2023 1126   BILITOT 0.4 07/03/2023 1126   BILITOT 0.7 12/08/2022 1208   GFRNONAA 18 (L) 07/03/2023 1126   GFRNONAA 20 (L) 12/08/2022 1208   GFRAA 32 (L) 03/30/2020 0932        ASSESSMENT and THERAPY PLAN:   Anemia of chronic disease [name] is here for follow-up of her multifactorial anemia, anemia of  chronic disease.  She continues on retacrit 40,000 units every 2 weeks for a hemoglobin less than  or equal to 10.    Will proceed with Retacrit today her hemoglobin is 10.0.  She is tolerating the Retacrit well.  Bradycardia: I obtained EKG and reached out to Dr. Shirlee Latch who reviewed it.  He informed me that patient appears to have sinus bradycardia and he recommended discontinuing her metoprolol which I did today.  I gave her written instructions in her after visit summary to stop taking the metoprolol and I let her know that Dr. Alford Highland office would reach out to her for sooner follow-up than when she is scheduled to see him in 5 months.  I sent a message to Julious Payer and MR call her in to request scheduling of this appointment at Dr. Alford Highland request.  She will return in 2 weeks for lab and injection and 4 weeks for lab, f/u, and injection.   All questions were answered. The patient knows to call the clinic with any problems, questions or concerns. We can certainly see the patient much sooner if necessary.  Total encounter time:30 minutes*in face-to-face visit time, chart review, lab review, care coordination, order entry, and documentation of the encounter time.    Lillard Anes, NP 07/17/23 12:27 PM Medical Oncology and Hematology [facility] [address] [place], [place] [address] Tel. [phone]    Fax. [phone]  *Total Encounter Time as defined by the Centers for Medicare and Medicaid Services includes, in addition to the face-to-face time of a patient visit (documented in the note above) non-face-to-face time: obtaining and reviewing outside history, ordering and reviewing medications, tests or procedures, care coordination (communications with other health care professionals or caregivers) and documentation in the medical record.

## 2023-07-17 NOTE — Patient Instructions (Signed)
Your heart rate is low, and Dr. Shirlee Latch reviewed the EKG we obtained today.  He recommends that you stop taking the Metoprolol XL.  I discontinued it from your medication list.  Dr. Alford Highland office will reach out to you so you can follow-up with their office.

## 2023-07-18 ENCOUNTER — Ambulatory Visit: Payer: Medicare HMO | Attending: Cardiology | Admitting: *Deleted

## 2023-07-18 DIAGNOSIS — Z952 Presence of prosthetic heart valve: Secondary | ICD-10-CM | POA: Diagnosis not present

## 2023-07-18 DIAGNOSIS — Z5181 Encounter for therapeutic drug level monitoring: Secondary | ICD-10-CM

## 2023-07-18 LAB — POCT INR: INR: 1.9 — AB (ref 2.0–3.0)

## 2023-07-18 NOTE — Patient Instructions (Signed)
Increase warfarin to 1 tablet (5mg  daily) daily except 2 tablets on Mondays, Wednesdays and Fridays.  Stay consistent with Boost drinks (1/2 bottle each day).  Recheck in 2 weeks If starting any new medications then please call.

## 2023-07-19 DIAGNOSIS — C4441 Basal cell carcinoma of skin of scalp and neck: Secondary | ICD-10-CM | POA: Diagnosis not present

## 2023-07-19 DIAGNOSIS — Z08 Encounter for follow-up examination after completed treatment for malignant neoplasm: Secondary | ICD-10-CM | POA: Diagnosis not present

## 2023-07-19 DIAGNOSIS — Z85828 Personal history of other malignant neoplasm of skin: Secondary | ICD-10-CM | POA: Diagnosis not present

## 2023-07-20 ENCOUNTER — Ambulatory Visit: Payer: Medicare HMO | Admitting: Internal Medicine

## 2023-07-20 ENCOUNTER — Other Ambulatory Visit: Payer: Self-pay

## 2023-07-20 ENCOUNTER — Encounter: Payer: Self-pay | Admitting: Internal Medicine

## 2023-07-20 VITALS — BP 167/60 | HR 43 | Temp 98.2°F | Resp 16

## 2023-07-20 DIAGNOSIS — A0472 Enterocolitis due to Clostridium difficile, not specified as recurrent: Secondary | ICD-10-CM

## 2023-07-20 DIAGNOSIS — R319 Hematuria, unspecified: Secondary | ICD-10-CM | POA: Diagnosis not present

## 2023-07-20 DIAGNOSIS — N390 Urinary tract infection, site not specified: Secondary | ICD-10-CM

## 2023-07-20 NOTE — Patient Instructions (Signed)
It was a pleasure seeing you today. You are currently not having any symptoms so no current need for antibiotics. Please get in contact with [place] if you have concerns about a new infection.   Gardiner Barefoot, MD

## 2023-07-20 NOTE — Progress Notes (Signed)
[facility]      Reason for Consult:recurrent UTIs    Referring Physician: Dr. Sherron Monday    Patient ID: [name], female    DOB: [birth year], 82 y.o.   MRN: [id number]  HPI:   [name] is here for evaluation of recurrent urinary symptoms. She has been followed by urology for over 2 years due to a report of recurrent UTIs.  She gives a history of intermittent symptoms of occasional burning with urination, incontinence, hematuria and some back pain.  She equates any of these symptoms with a UTI and generally is treated with antibiotics for this.  This occurs quite frequently.  Occasionally all symptoms occur together.  She does not get a fever or chills.   Her recent culture shows an ESBL Klebsiella.  She is taking an antibiotic now and is asymptomatic.  She is unsure of which antibiotic she is taking.  She has a history of C diff infection.    Past Medical History:  Diagnosis Date   Aortic atherosclerosis (HCC) 01/23/2018   Atrial fibrillation (HCC)    Benign positional vertigo 10/06/2011   Carotid artery disease [facility])    Chemotherapy induced neutropenia (HCC) 07/21/2019   Cholelithiasis 01/23/2018   Cholelithiasis 01/23/2018   Chronic anticoagulation    Colovesical fistula, ruled out 04/14/2022   Coronary artery disease    status post coronary artery bypass grafting times 07/10/2004   Diabetes mellitus    Hypercholesteremia    Hypertension    Mechanical heart valve present    H. aortic valve replacement at the time of bypass surgery October 2005   Moderate to severe pulmonary hypertension (HCC)    Peripheral arterial disease (HCC)    history of left common iliac artery PTA and stenting for a chronic total occlusion 08/26/01   S/P cholecystectomy 03/06/2018    Prior to Admission medications   Medication Sig Start Date End Date Taking? Authorizing Provider  acetaminophen (TYLENOL) 325 MG tablet Take 325 mg by mouth every 6 (six) hours as needed  for mild pain or fever.   Yes [provider]  allopurinol (ZYLOPRIM) 100 MG tablet Take 100 mg by mouth daily.   Yes [provider]  ALPRAZolam Prudy Feeler) 0.5 MG tablet Take 0.5 mg by mouth daily.   Yes [provider]  amLODipine (NORVASC) 10 MG tablet Take 1 tablet (10 mg total) by mouth daily. Patient taking differently: Take 5 mg by mouth daily. 12/31/22  Yes Emokpae, Courage, MD  aspirin EC 81 MG tablet Take 81 mg by mouth daily. Swallow whole.   Yes [provider]  atorvastatin (LIPITOR) 80 MG tablet Take 1 tablet (80 mg total) by mouth daily at 6 PM. 11/22/18  Yes Glade [name], MD  Cholecalciferol (VITAMIN D3) 50 MCG (2000 UT) capsule Take 2,000 Units by mouth daily with lunch. 11/26/18  Yes [provider]  cloNIDine (CATAPRES) 0.2 MG tablet Take 0.2 mg by mouth 2 (two) times daily.   Yes [provider]  Cyanocobalamin (VITAMIN B12) 1000 MCG TBCR Take 1,000 mcg by mouth daily with lunch.   Yes [provider]  famotidine (PEPCID) 20 MG tablet Take 20 mg by mouth 2 (two) times daily.   Yes [provider]  ferrous sulfate 325 (65 FE) MG tablet Take 1 tablet (325 mg total) by mouth daily with breakfast. Patient taking differently: Patient takes 2 tablets by mouth daily. 09/06/22  Yes Johnson, Clanford L, MD  furosemide (LASIX) 40  MG tablet Patient takes 2 tablets by mouth in the morning and 1.5 tablets at night.   Yes [provider]  hydrALAZINE (APRESOLINE) 100 MG tablet Take 1 tablet (100 mg total) by mouth 3 (three) times daily. Patient taking differently: Take 100 mg by mouth 2 (two) times daily. 12/31/22  Yes Emokpae, Courage, MD  hydrocortisone (ANUSOL-HC) 2.5 % rectal cream Place 1 Application rectally 2 (two) times daily. For 14 days. May repeat if needed. 05/22/23  Yes Tiffany Kocher, PA-C  insulin aspart (NOVOLOG) 100 UNIT/ML injection Inject 2-8 Units into the skin 3 (three) times daily with meals. Per  sliding scale, If blood sugar is 200 to 250, give 2 units. If blood sugar is 251 to 300, give 4 units. If blood sugar is 301 to 350, give 6 units. If blood sugar is greater than 350, give 8 units and call MD. 09/05/22  Yes Johnson, Clanford L, MD  Insulin Syringe-Needle U-100 (B-D INS SYR ULTRAFINE .3CC/29G) 29G X 1/2" 0.3 ML MISC Use for sliding scale for breakthrough high blood sugar readings 02/24/22  Yes Iruku, Burnice Logan, MD  ondansetron (ZOFRAN-ODT) 4 MG disintegrating tablet Take 4 mg by mouth every 8 (eight) hours as needed for nausea or vomiting. 01/12/22  Yes [provider]  pantoprazole (PROTONIX) 40 MG tablet Take 1 tablet (40 mg total) by mouth 2 (two) times daily. 12/31/22  Yes Emokpae, Courage, MD  senna-docusate (SENOKOT-S) 8.6-50 MG tablet Take 2 tablets by mouth at bedtime. Patient taking differently: Take 2 tablets by mouth at bedtime. As needed 12/31/22  Yes Emokpae, Courage, MD  simethicone (GAS-X) 80 MG chewable tablet Chew 1 tablet (80 mg total) by mouth 4 (four) times daily as needed for flatulence. 04/16/22 06/25/24 Yes Danford, Earl Lites, MD  spironolactone (ALDACTONE) 25 MG tablet TAKE 1 TABLET EVERY DAY 05/14/23  Yes Laurey Morale, MD  tadalafil, PAH, (ADCIRCA) 20 MG tablet TAKE 2 TABLETS BY MOUTH ONCE DAILY 12/11/22  Yes Laurey Morale, MD  warfarin (COUMADIN) 5 MG tablet Take 1 tablet (5 mg total) by mouth See admin instructions. Take 1 tablet 5mg  on Tuesday/Thursday/Sunday and 1/2 Tab (2.5 mg) on MWFSat Patient taking differently: Take 5 mg by mouth See admin instructions. 12/31/22  Yes Emokpae, Courage, MD  budesonide (PULMICORT) 0.5 MG/2ML nebulizer solution Take 2 mLs (0.5 mg total) by nebulization 2 (two) times daily. Patient not taking: Reported on 07/20/2023 01/26/23   Meredeth Ide, MD  ipratropium-albuterol (DUONEB) 0.5-2.5 (3) MG/3ML SOLN Take 3 mLs by nebulization every 6 (six) hours as needed. Patient not taking: Reported on 07/20/2023 01/26/23   Meredeth Ide, MD  sertraline (ZOLOFT) 25 MG tablet Take 25 mg by mouth daily. Patient not taking: Reported on 07/20/2023    [provider]    Allergies  Allergen Reactions   Augmentin [Amoxicillin-Pot Clavulanate] Diarrhea and Nausea And Vomiting   Chlorthalidone Other (See Comments)   Ciprofloxacin Diarrhea, Nausea Only and Other (See Comments)    Stomach ache   Codeine     Insomnia, anxiety   Coreg [Carvedilol] Other (See Comments)    Terrible cramping in the feet and had a lot of bowel movements, but not diarrhea   Diflucan [Fluconazole] Diarrhea   Escitalopram     Other Reaction(s): made head feel funny   Levofloxacin In D5w     Vomiting, diarrhea, insomnia   Losartan Swelling and Other (See Comments)    Patient doesn't recall site of swelling   Niacin  Other Reaction(s): made her feel hot all over   Privigen [Immune Globulin (Human)] Other (See Comments)    Severe/excruciating pain   Sertraline     Other Reaction(s): made head feel funny   Sulfamethoxazole-Trimethoprim Diarrhea, Nausea Only and Other (See Comments)    Stomach upset   Verapamil Hives   Zetia [Ezetimibe] Other (See Comments)    Reaction not recalled   Zocor [Simvastatin - High Dose] Other (See Comments)    Reaction not recalled   Gatifloxacin Rash and Other (See Comments)    Redness to skin around eye    Social History   Tobacco Use   Smoking status: Former    Current packs/day: 1.00    Average packs/day: 1 pack/day for 30.0 years (30.0 ttl pk-yrs)    Types: Cigarettes   Smokeless tobacco: Never   Tobacco comments:    quit smoking 2005  Vaping Use   Vaping status: Never Used  Substance Use Topics   Alcohol use: No    Alcohol/week: 0.0 standard drinks of alcohol   Drug use: No    Family History  Problem Relation Age of Onset   Heart disease Father    Hypertension Father    Hyperlipidemia Father    Breast cancer Neg Hx      Review of Systems  Constitutional: negative for  fevers and chills All other systems reviewed and are negative    Constitutional: in no apparent distress There were no vitals filed for this visit. EYES: anicteric Respiratory: normal respiratory effort  Labs: Lab Results  Component Value Date   WBC 5.0 07/17/2023   HGB 10.0 (L) 07/17/2023   HCT 30.6 (L) 07/17/2023   MCV 92.2 07/17/2023   PLT 99 (L) 07/17/2023    Lab Results  Component Value Date   CREATININE 2.78 (H) 07/17/2023   BUN 98 (H) 07/17/2023   NA 134 (L) 07/17/2023   K 4.6 07/17/2023   CL 101 07/17/2023   CO2 24 07/17/2023    Lab Results  Component Value Date   ALT 21 07/17/2023   AST 35 07/17/2023   ALKPHOS 126 07/17/2023   BILITOT 0.5 07/17/2023   INR 1.9 (A) 07/18/2023     Assessment: recurrent UTIs.  I discussed UTis and her symptoms.  I suspect her symptoms are largely not true infections and simply intermittent issues with incontinence and frequency.  She is on a lot of medications including lasix so difficult to equate frequency with infection.  Additionally she is on coumadin so may have some hematuria related to her anti-coagulation.   I discussed colonization and likely will have a positive urine culture when checked except when on antibiotics.   I recommended avoiding antibiotics unless there is a clear indication or symptom, particularly with the history of C diff.  At this time, she is asymptomatic so no antibiotics indicated now.    Plan: 1)  follow up in our clinic for any new symptoms that are concerning for UTI.

## 2023-07-25 ENCOUNTER — Ambulatory Visit: Payer: Self-pay

## 2023-07-25 ENCOUNTER — Telehealth: Payer: Self-pay

## 2023-07-25 NOTE — Telephone Encounter (Signed)
Per NP visit on 10/8, the nurse called CHF clinic to confirm pt has a f/u with their office d/t discontinuing metoprolol and noted bradycardia on EKG. Pt is scheduled for 07/30/23.

## 2023-07-25 NOTE — Patient Outreach (Signed)
Care Coordination   Follow Up Visit Note   07/25/2023 Name: [name] MRN: [id number] DOB: June 27, 1941  [name] is a 82 y.o. year old female who sees [name], [name], MD for primary care. I spoke with daughter [name] by phone today.  What matters to the patients health and wellness today?  Patient would like to remain free from recurrent UTI's.     Goals Addressed             This Visit's Progress    To recover from UTI   On track    Care Coordination Interventions: Completed successful outbound call with daughter [name]  Evaluation of current treatment plan related to UTI and patient's adherence to plan as established by provider Discussed with daughter [name], patient completed her f/u with Infectious Disease for evaluation and treatment of recurrent UTI Review of patient status, including review of consultant's reports, relevant laboratory and other test results, and medications completed Assessment: recurrent UTIs.  I discussed UTis and her symptoms.  I suspect her symptoms are largely not true infections and simply intermittent issues with incontinence and frequency.  She is on a lot of medications including lasix so difficult to equate frequency with infection.  Additionally she is on coumadin so may have some hematuria related to her anti-coagulation.   I discussed colonization and likely will have a positive urine culture when checked except when on antibiotics.   I recommended avoiding antibiotics unless there is a clear indication or symptom, particularly with the history of C diff.  At this time, she is asymptomatic so no antibiotics indicated now.   Plan: 1)  follow up in our clinic for any new symptoms that are concerning for UTI.  Determined patient and daughter verbalize understanding of the recommendations provided    Interventions Today    Flowsheet Row Most Recent Value  Chronic Disease   Chronic disease during today's visit Other  [impaired urinary  elimination]  General Interventions   General Interventions Discussed/Reviewed General Interventions Discussed, General Interventions Reviewed, Doctor Visits, Labs, Vaccines  Vaccines RSV, Flu  Doctor Visits Discussed/Reviewed Doctor Visits Discussed, Doctor Visits Reviewed, Specialist  Education Interventions   Education Provided Provided Education  Provided Verbal Education On Medication, When to see the doctor  Pharmacy Interventions   Pharmacy Dicussed/Reviewed Pharmacy Topics Discussed, Pharmacy Topics Reviewed, Medications and their functions          SDOH assessments and interventions completed:  No     Care Coordination Interventions:  Yes, provided   Follow up plan: Follow up call scheduled for 08/31/23 @09 :00 AM    Encounter Outcome:  Patient Visit Completed

## 2023-07-25 NOTE — Patient Instructions (Signed)
Visit Information  Thank you for taking time to visit with me today. Please don't hesitate to contact me if I can be of assistance to you.   Following are the goals we discussed today:   Goals Addressed             This Visit's Progress    To recover from UTI   On track    Care Coordination Interventions: Completed successful outbound call with daughter Hallett Sink  Evaluation of current treatment plan related to UTI and patient's adherence to plan as established by provider Discussed with daughter Shiloh Sink, patient completed her f/u with Infectious Disease for evaluation and treatment of recurrent UTI Review of patient status, including review of consultant's reports, relevant laboratory and other test results, and medications completed Assessment: recurrent UTIs.  I discussed UTis and her symptoms.  I suspect her symptoms are largely not true infections and simply intermittent issues with incontinence and frequency.  She is on a lot of medications including lasix so difficult to equate frequency with infection.  Additionally she is on coumadin so may have some hematuria related to her anti-coagulation.   I discussed colonization and likely will have a positive urine culture when checked except when on antibiotics.   I recommended avoiding antibiotics unless there is a clear indication or symptom, particularly with the history of C diff.  At this time, she is asymptomatic so no antibiotics indicated now.   Plan: 1)  follow up in our clinic for any new symptoms that are concerning for UTI.  Determined patient and daughter verbalize understanding of the recommendations provided        Our next appointment is by telephone on 08/31/23 at 09:00 AM  Please call the care guide team at [phone] if you need to cancel or reschedule your appointment.   If you are experiencing a Mental Health or Behavioral Health Crisis or need someone to talk to, please call 1-800-273-TALK (toll free, 24 hour  hotline)  Patient verbalizes understanding of instructions and care plan provided today and agrees to view in MyChart. Active MyChart status and patient understanding of how to access instructions and care plan via MyChart confirmed with patient.     Delsa Sale RN BSN CCM [place]  [facility], [facility] Nurse Care Coordinator  Direct Dial: [phone] Website: [email or web address]

## 2023-07-27 NOTE — Progress Notes (Signed)
Date:  07/30/2023   ID:  TANGI PERI, DOB May 24, 1941, MRN [id number]    Provider location: [facility] Type of Visit: Established patient   PCP:  Gaspar Garbe, MD  Primary Cardiologist:  Nanetta Batty, MD HF Cardiologist: Dr. Shirlee Latch   History of Present Illness: KAITLYND YOH is a 82 y.o. female who has a history of PAD, CAD s/p CABG, mechanical AVR, pulmonary hypertension, and chronic diastolic CHF with prominent RV dysfunction. Patient had CABG-AVR in 10/05.  She has been in chronic atrial fibrillation, it appears, since at least 4/17.  She has significant PAD followed by Dr. Allyson Sabal.     She was admitted in 5/19 with dyspnea and marked volume overloaded. She was started on Lasix and it was titrated up.  Echo showed EF 55-60% with moderate RV dilation, severe RV systolic dysfunction, and PASP 88 mmHg.  RHC in 9/19 showed moderate pulmonary hypertension with near normal filling pressures. PVR was not particularly high at 3.1 WU primarily due to elevated cardiac output. V/Q scan did not show chronic PEs and PFTs were unremarkable. She is on Adcirca.    She was admitted in 2/20 with upper GI bleeding, found to have gastric ulcers.  She had a CT chest in 3/20 with subtle bilateral ground glass opacities.  Referred to pulmonary.   She was admitted again in 6/20 with a GI bleed and required transfusion.   She was seen by pulmonary in 7/20, PFTs showed no obstruction/restriction but decreased DLCO.    She was seen by rheumatology due to concern for rheumatoid arthritis.  She was thought to have gout and OA but no RA.   Echo was done in 7/20 showing  EF 60-65% with mild LVH, normal RV, mechanical aortic valve with mean gradient 17 mmHg, PASP 34 mmHg.   In 8/20, she was admitted with CHF/pneumonitis and pancytopenia.  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  She has been treated with rituximab and  prednisone, now off.   Echo 4/21 showed EF 65-70%, normal RV, moderate TR, mechanical aortic valve with mean gradient 20 mmHg, PASP 36 mmHg.   Had echo 4/22 w/ stable findings. LVEF 65-70% w/ GIIIDD, prior mechanical AVR stable w/ gradient similar to prior study (mean gradient 18 mmHg). RV normal w/ mildly elevated RVSP at 30.5.   Echo in 8/23 showed EF 55-60%, mild LVH, normal RV, PASP 42 mmHg, mild MR, mild-moderate TR, mechanical St Jude aortic valve with mean gradient 26 mmHg (up from 21 mmHg).   She was admitted in 11/23 with AKI on CKD stage 4, she was initially given IV fluid then Lasix when she became volume overloaded.  Thought to be poor HD candidate due to frailty.   Admit 3/24 with a/c anemia with concern for GI bleed and sepsis secondary to colitis. Declined endoscopic evaluation for anemia. Given 1 u RBCS. Treated with abx. Coursc/b urinary retention and AKI on CKD (Scr peaked at 2.8).   Admitted 4/24 with acute hypoxic respiratory failure 2/2 PNA and AKI. She was resuscitated with IVF and did require several doses of IV lasix.   Echo 5/24 showed EF 65-70%, RV is low normal, mild to moderate MR with mean MV gradient 5, mild to moderate TR, stable St Jude mechanical AoV with mild elevated gradient 22 mmHg.  Today she returns for AHF follow up with her daughter. Overall feeling tired today as she didn't sleep good  last night, otherwise feels good other days. Denies palpitations, CP, dizziness, or PND/Orthopnea. Has edema every now and then, resolved with elevation. No SOB. Appetite good. No fever or chills. Has not been weighing at home, as she's unable to stand on the scale without holding on. SBP at home 150s-170s. Taking all medications.  ECG: none today  Labs (8/19): K 4.2, creatinine 0.8, LDL 56 Labs (9/19): K 4.3, Na 129, creatinine 0.87, hgb 9.8 Labs (10/19): ANA+, RF 36.8 (elevated), SCL-70 negative, anti-centromere Ab negative, ds-DNA negative.  Labs (12/19): K 4,  creatinine 1.09 Labs (1/20): CCP Ab elevated 115 Labs (2/20): K 4.1, creatinine 1.24 Labs (7/20): K 4.2, creatinine 1.99, hgb 6.5 (transfused) Labs (11/20): K 3.5, creatinine 1.64 Labs (12/20): LDL 60 Labs (5/21): K 4.4, creatinine 1.85 => 2.66 Labs (03/30/20): Creatinine 1.7  Labs (1/24): K 4.1, creatinine 2.35, hgb 9.5 Labs (9/24): K 4.9, creatinine 2.72, hgb 10.8   6 min walk (10/19): 189 m 6 min walk (1/20): 183 m   PMH: 1. Mechanical St Jude aortic valve 10/05: Goal INR 2.5-3.5.  2. CAD: s/p CABG x 2 with AVR in 10/05.  - Cardiolite in 2014 showed no ischemia.  3. HTN 4. Hyperlipidemia 5. Type 2 diabetes 6. PAD: Peripheral arterial dopplers (7/19) with >50% CIA stenosis on right, 70-99% CIA stenosis on the left.  7. H/o CCY 8. Atrial fibrillation: Long-standing persistent.  9. Chronic diastolic CHF with prominent RV dysfunction: Echo (5/19) with EF 55-60%, grade 3 diastolic dysfunction, mechanical aortic valve with mean gradient 10 mmHg, mild MR, moderate RV dilation with moderate-severely decreased RV systolic function, biatrial enlargement, PASP 88 mmHg.  - Echo (7/20): EF 60-65% with mild LVH, normal RV, mechanical aortic valve with mean gradient 17 mmHg, PASP 34 mmHg.  - Echo (4/21): EF 65-70%, normal RV, moderate TR, mechanical aortic valve with mean gradient 20 mmHg, PASP 36 mmHg.  - Echo (8/23): EF 55-60%, mild LVH, normal RV, PASP 42 mmHg, mild MR, mild-moderate TR, mechanical St Jude aortic valve with mean gradient 26 mmHg (up from 21 mmHg).  10. Carotid stenosis: 40-59% BICA stenosis on 7/19 carotid dopplers.  - Carotid dopplers (5/21): 40-59% BICA stenosis.  11. Pulmonary hypertension: RHC (9/19) with mean RA 5, PA 63/17 mean 36, mean PCWP 16, CI 4.16, PVR 3.1 WU.  - V/Q scan (10/19): no evidence for chronic PE.  - PFTs (10/19): minimal obstruction.  - ANA+, RF 36.8 (elevated), SCL-70 negative, anti-centromere Ab negative, ds-DNA negative, CCP Ab positive. She saw  rheumatology, NOT thought to have RA.  - Echo in 7/20 with normal-appearing RV and PASP 34 mmHg.  - PFTs (7/20) with no significant obstruction/restriction but decreased DLCO.  - PYP scan not suggestive of TTR amyloidosis.  - Echo (4/21): EF 65-70%, normal RV, moderate TR, mechanical aortic valve with mean gradient 20 mmHg, PASP 36 mmHg.  - Echo (8/23): EF 55-60%, mild LVH, normal RV, PASP 42 mmHg, mild MR, mild-moderate TR, mechanical St Jude aortic valve with mean gradient 26 mmHg (up from 21 mmHg).  12. 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 History of GI bleeding.  15. 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  - Treated with Rituximab and prednisone.  16. CKD: stage 4.   Current Outpatient Medications  Medication Sig Dispense Refill   acetaminophen (TYLENOL) 325 MG tablet Take 325 mg by mouth every 6 (six) hours as needed for mild pain or fever.     allopurinol (ZYLOPRIM) 100 MG tablet Take  100 mg by mouth daily.     ALPRAZolam (XANAX) 0.5 MG tablet Take 0.5 mg by mouth daily.     amLODipine (NORVASC) 10 MG tablet Take 1 tablet (10 mg total) by mouth daily. (Patient taking differently: Take 5 mg by mouth daily.) 90 tablet 3   aspirin EC 81 MG tablet Take 81 mg by mouth daily. Swallow whole.     atorvastatin (LIPITOR) 80 MG tablet Take 1 tablet (80 mg total) by mouth daily at 6 PM.     budesonide (PULMICORT) 0.5 MG/2ML nebulizer solution Take 2 mLs (0.5 mg total) by nebulization 2 (two) times daily. (Patient taking differently: Take 0.5 mg by nebulization 2 (two) times daily. As needed) 120 mL 1   Cholecalciferol (VITAMIN D3) 50 MCG (2000 UT) capsule Take 2,000 Units by mouth daily with lunch.     cloNIDine (CATAPRES) 0.2 MG tablet Take 0.2 mg by mouth 2 (two) times daily.     Cyanocobalamin (VITAMIN B12) 1000 MCG TBCR Take 1,000 mcg by mouth daily with lunch.     famotidine (PEPCID) 20 MG tablet Take 20 mg by mouth 2 (two) times  daily.     ferrous sulfate 325 (65 FE) MG tablet Take 1 tablet (325 mg total) by mouth daily with breakfast. (Patient taking differently: Patient takes 2 tablets by mouth daily.)  3   furosemide (LASIX) 40 MG tablet Patient takes 2 tablets by mouth in the morning and 1.5 tablets at night.     hydrALAZINE (APRESOLINE) 100 MG tablet Take 1 tablet (100 mg total) by mouth 3 (three) times daily. 270 tablet 3   hydrocortisone (ANUSOL-HC) 2.5 % rectal cream Place 1 Application rectally 2 (two) times daily. For 14 days. May repeat if needed. 30 g 1   insulin aspart (NOVOLOG) 100 UNIT/ML injection Inject 2-8 Units into the skin 3 (three) times daily with meals. Per sliding scale, If blood sugar is 200 to 250, give 2 units. If blood sugar is 251 to 300, give 4 units. If blood sugar is 301 to 350, give 6 units. If blood sugar is greater than 350, give 8 units and call MD.     Insulin Syringe-Needle U-100 (B-D INS SYR ULTRAFINE .3CC/29G) 29G X 1/2" 0.3 ML MISC Use for sliding scale for breakthrough high blood sugar readings 100 each 1   ipratropium-albuterol (DUONEB) 0.5-2.5 (3) MG/3ML SOLN Take 3 mLs by nebulization every 6 (six) hours as needed. 360 mL 0   ondansetron (ZOFRAN-ODT) 4 MG disintegrating tablet Take 4 mg by mouth every 8 (eight) hours as needed for nausea or vomiting.     pantoprazole (PROTONIX) 40 MG tablet Take 1 tablet (40 mg total) by mouth 2 (two) times daily. 60 tablet 2   senna-docusate (SENOKOT-S) 8.6-50 MG tablet Take 2 tablets by mouth at bedtime. (Patient taking differently: Take 2 tablets by mouth at bedtime. As needed) 60 tablet 2   sertraline (ZOLOFT) 25 MG tablet Take 25 mg by mouth daily.     simethicone (GAS-X) 80 MG chewable tablet Chew 1 tablet (80 mg total) by mouth 4 (four) times daily as needed for flatulence. 100 tablet 2   spironolactone (ALDACTONE) 25 MG tablet TAKE 1 TABLET EVERY DAY 90 tablet 3   tadalafil, PAH, (ADCIRCA) 20 MG tablet TAKE 2 TABLETS BY MOUTH ONCE DAILY  60 tablet 11   warfarin (COUMADIN) 5 MG tablet Take 1 tablet (5 mg total) by mouth See admin instructions. Take 1 tablet 5mg  on Tuesday/Thursday/Sunday and 1/2  Tab (2.5 mg) on MWFSat (Patient taking differently: Take 5 mg by mouth See admin instructions.)     No current facility-administered medications for this encounter.    Allergies:   Augmentin [amoxicillin-pot clavulanate], Chlorthalidone, Ciprofloxacin, Codeine, Coreg [carvedilol], Diflucan [fluconazole], Escitalopram, Levofloxacin in d5w, Losartan, Niacin, Privigen [immune globulin (human)], Sertraline, Sulfamethoxazole-trimethoprim, Verapamil, Zetia [ezetimibe], Zocor [simvastatin - high dose], and Gatifloxacin   Social History:  The patient  reports that she has quit smoking. Her smoking use included cigarettes. She has a 30 pack-year smoking history. She has never used smokeless tobacco. She reports that she does not drink alcohol and does not use drugs.   Family History:  [name] family history includes Heart disease in her father; Hyperlipidemia in her father; Hypertension in her father.   ROS:  Please see the history of present illness.   All other systems are personally reviewed and negative.   BP (!) 166/54   Pulse (!) 54   Wt 48.4 kg (106 lb 9.6 oz)   SpO2 99%   BMI 22.28 kg/m   Wt Readings from Last 3 Encounters:  07/30/23 48.4 kg (106 lb 9.6 oz)  07/17/23 50 kg (110 lb 4.8 oz)  06/26/23 49.8 kg (109 lb 12.8 oz)   Physical Exam:   General:  elderly appearing.  No respiratory difficulty. Walked in with walker HEENT: normal Neck: supple. JVD flat. Carotids 2+ bilat; no bruits. No lymphadenopathy or thyromegaly appreciated. Cor: PMI nondisplaced. Regular rate & rhythm. No rubs, gallops or murmurs. Lungs: clear Abdomen: soft, nontender, nondistended. No hepatosplenomegaly. No bruits or masses. Good bowel sounds. Extremities: no cyanosis, clubbing, rash, edema  Neuro: alert & oriented x 3, cranial nerves grossly  intact. moves all 4 extremities w/o difficulty. Affect pleasant.   Recent Labs: 10/26/2022: B Natriuretic Peptide 390.1 01/26/2023: Magnesium 2.0 07/17/2023: ALT 21; BUN 98; Creatinine, Ser 2.78; Hemoglobin 10.0; Platelets 99; Potassium 4.6; Sodium 134  Personally reviewed   ASSESSMENT AND PLAN: 1. Atrial fibrillation: Long-standing persistent.  She has been in AF, it appears, since 4/23. I suspect she would be unlikely to easily convert and remains in NSR at this point.  For now, will follow rate control strategy.  - Off toprol with bradycardia. HR in 50s today.  - Continue warfarin, goal INR 2.5-3.5. [facility]R [facility] 2. Chronic diastolic CHF: PYP scan did not suggest ATTR amyloidosis.  Most recent echo in 8/23 showed EF 55-60%, mild LVH, normal RV, PASP 42 mmHg, mild MR, mild-moderate TR, mechanical St Jude aortic valve with mean gradient 26 mmHg (up from 21 mmHg). Elevation in gradient across the aortic valve may play a role. She does not appear volume overloaded on exam, has some LEE which may be venous insufficiency vs side effect from amlodipine. NYHA class III, probably limited more by frailty than anything else.  - Continue Lasix 80 mg qam/60 mg qpm.  - BMET, CBC, INR today (will forward to [facility] clinic) 3. CAD: denies CP.  - Continue ASA. - Continue statin. 4. PAD: Significant on 7/19 dopplers but no claudication, no pedal ulcerations. Has "tingling" in left thigh. Pedal pulses present but weak. No pedal ulcers - Continue statin and ASA as above 5. Mechanical aortic valve: Mean gradient elevated on recent echoes, 26 mmHg on 8/23 echo up from 21 mmHg on the prior.  This probably plays a role in CHF.  She is on warfarin with INR goal 2.5-3.5, and ASA. - Do not think that there is a role here  for close investigation of aortic valve by TEE or CTA.  She has had an elevated gradient 20+ on last 2 echoes, do not think she would be a candidate for redo valve replacement and we cannot  anticoagulate her more aggressively.  6. Pulmonary hypertension: Moderate pulmonary hypertension on 9/19 with RV dysfunction and dilation noted on last echo. PVR only 3.1 WU but cardiac output was high.  V/Q scan in 10/19 with no evidence for chronic PEs and PFTs in 10/19 and again in 7/20 were unremarkable except for low DLCO.  I think there is a possible group 1 PH component though this is a borderline call as PVR is not significantly elevated. ANA and RF positive, as is CCP Ab.  She saw rheumatology and per her report, was not thought to have rheumatoid arthritis.  Subsequently, she has been diagnosed with p-ANCA+ vasculitis. CT chest showed subtle bilateral ground glass opacities in 2020, she is followed by pulmonary.  Recent repeat echo in 8/23 showed normal RV and estimated PA systolic pressure 42 mmHg mmHg.  - Continue Adcirca 40 mg daily.  It was borderline to start this, not sure she has had marked benefit.  Will hold off on starting a 2nd PH medication. 7. Rheumatology: Joint pain, positive RF and CCP Ab.  Lung findings (ground glass, pulmonary hypertension) also thought to be concerning for RA. However, per patient's report, she was by her rheumatologist that did not have RA and did have gout.  More recently, she has been found to have p-ANCA+ vasculitis, eosinophilic granulomatosis with polyangiitis.  She was treated with rituximab and prednisone by Dr Darnelle Catalan.  Now off prednisone. 8. HTN: BP has been elevated at home and today in clinic. From stopping metoprolol? Increase amlodipine to 10 mg daily.  9. CKD: Stage 4.  She would be a poor candidate for HD. She has P-ANCA vasculitis.  - Followed by Dr. Kathrene Bongo, sees again Thursday.  - Cr stable 2.78 on 07/17/23 10. Left Carotid stenosis: Has been seen by [facility].  - Not a candidate for TCAR 11. Multifactorial anemia: Followed by hematology - Hgb 10 on 07/17/23  Followup in 6 months with Dr. Shirlee Latch  Signed, Brynda Peon, AGACNP-BC  07/30/2023    [facility] [address] [facility] [place] [address] [phone] (office) [phone] (fax)

## 2023-07-30 ENCOUNTER — Ambulatory Visit (INDEPENDENT_AMBULATORY_CARE_PROVIDER_SITE_OTHER): Payer: Medicare HMO | Admitting: *Deleted

## 2023-07-30 ENCOUNTER — Encounter (HOSPITAL_COMMUNITY): Payer: Self-pay

## 2023-07-30 ENCOUNTER — Ambulatory Visit (HOSPITAL_COMMUNITY)
Admission: RE | Admit: 2023-07-30 | Discharge: 2023-07-30 | Disposition: A | Discharge status: Home / Self Care | Payer: Medicare HMO | Source: Ambulatory Visit | Attending: Internal Medicine | Admitting: Internal Medicine

## 2023-07-30 VITALS — BP 166/54 | HR 54 | Wt 106.6 lb

## 2023-07-30 DIAGNOSIS — I272 Pulmonary hypertension, unspecified: Secondary | ICD-10-CM | POA: Diagnosis not present

## 2023-07-30 DIAGNOSIS — I5032 Chronic diastolic (congestive) heart failure: Secondary | ICD-10-CM

## 2023-07-30 DIAGNOSIS — I4811 Longstanding persistent atrial fibrillation: Secondary | ICD-10-CM

## 2023-07-30 DIAGNOSIS — I2510 Atherosclerotic heart disease of native coronary artery without angina pectoris: Secondary | ICD-10-CM | POA: Diagnosis not present

## 2023-07-30 DIAGNOSIS — I7782 Antineutrophilic cytoplasmic antibody (ANCA) vasculitis: Secondary | ICD-10-CM | POA: Diagnosis not present

## 2023-07-30 DIAGNOSIS — Z952 Presence of prosthetic heart valve: Secondary | ICD-10-CM

## 2023-07-30 DIAGNOSIS — I130 Hypertensive heart and chronic kidney disease with heart failure and stage 1 through stage 4 chronic kidney disease, or unspecified chronic kidney disease: Secondary | ICD-10-CM | POA: Diagnosis not present

## 2023-07-30 DIAGNOSIS — Z79899 Other long term (current) drug therapy: Secondary | ICD-10-CM | POA: Diagnosis not present

## 2023-07-30 DIAGNOSIS — E1122 Type 2 diabetes mellitus with diabetic chronic kidney disease: Secondary | ICD-10-CM | POA: Diagnosis not present

## 2023-07-30 DIAGNOSIS — Z7982 Long term (current) use of aspirin: Secondary | ICD-10-CM | POA: Diagnosis not present

## 2023-07-30 DIAGNOSIS — N184 Chronic kidney disease, stage 4 (severe): Secondary | ICD-10-CM

## 2023-07-30 DIAGNOSIS — Z5181 Encounter for therapeutic drug level monitoring: Secondary | ICD-10-CM | POA: Diagnosis not present

## 2023-07-30 DIAGNOSIS — D649 Anemia, unspecified: Secondary | ICD-10-CM

## 2023-07-30 DIAGNOSIS — I6522 Occlusion and stenosis of left carotid artery: Secondary | ICD-10-CM

## 2023-07-30 DIAGNOSIS — I480 Paroxysmal atrial fibrillation: Secondary | ICD-10-CM | POA: Diagnosis not present

## 2023-07-30 DIAGNOSIS — M301 Polyarteritis with lung involvement [Churg-Strauss]: Secondary | ICD-10-CM | POA: Insufficient documentation

## 2023-07-30 DIAGNOSIS — I739 Peripheral vascular disease, unspecified: Secondary | ICD-10-CM

## 2023-07-30 DIAGNOSIS — Z7901 Long term (current) use of anticoagulants: Secondary | ICD-10-CM | POA: Insufficient documentation

## 2023-07-30 DIAGNOSIS — Z794 Long term (current) use of insulin: Secondary | ICD-10-CM | POA: Insufficient documentation

## 2023-07-30 DIAGNOSIS — Z951 Presence of aortocoronary bypass graft: Secondary | ICD-10-CM | POA: Diagnosis not present

## 2023-07-30 DIAGNOSIS — I10 Essential (primary) hypertension: Secondary | ICD-10-CM

## 2023-07-30 LAB — CBC
HCT: 27.4 % — ABNORMAL LOW (ref 36.0–46.0)
Hemoglobin: 8.5 g/dL — ABNORMAL LOW (ref 12.0–15.0)
MCH: 29.7 pg (ref 26.0–34.0)
MCHC: 31 g/dL (ref 30.0–36.0)
MCV: 95.8 fL (ref 80.0–100.0)
Platelets: 96 10*3/uL — ABNORMAL LOW (ref 150–400)
RBC: 2.86 MIL/uL — ABNORMAL LOW (ref 3.87–5.11)
RDW: 17.1 % — ABNORMAL HIGH (ref 11.5–15.5)
WBC: 6 10*3/uL (ref 4.0–10.5)
nRBC: 0 % (ref 0.0–0.2)

## 2023-07-30 LAB — BASIC METABOLIC PANEL
Anion gap: 14 (ref 5–15)
BUN: 128 mg/dL — ABNORMAL HIGH (ref 8–23)
CO2: 19 mmol/L — ABNORMAL LOW (ref 22–32)
Calcium: 8.7 mg/dL — ABNORMAL LOW (ref 8.9–10.3)
Chloride: 102 mmol/L (ref 98–111)
Creatinine, Ser: 3.23 mg/dL — ABNORMAL HIGH (ref 0.44–1.00)
GFR, Estimated: 14 mL/min — ABNORMAL LOW (ref >60)
Glucose, Bld: 184 mg/dL — ABNORMAL HIGH (ref 70–99)
Potassium: 4.8 mmol/L (ref 3.5–5.1)
Sodium: 135 mmol/L (ref 135–145)

## 2023-07-30 LAB — PROTIME-INR
INR: 2.9 — ABNORMAL HIGH (ref 0.8–1.2)
Prothrombin Time: 30.2 s — ABNORMAL HIGH (ref 11.4–15.2)

## 2023-07-30 MED ORDER — AMLODIPINE BESYLATE 10 MG PO TABS: 10.0000 mg | ORAL_TABLET | Freq: Every day | ORAL | 3 refills | Status: DC

## 2023-07-30 NOTE — Patient Instructions (Addendum)
Thank you for coming in today  If you had labs drawn today, any labs that are abnormal the clinic will call you No news is good news  Medications: Increase Amlodipine to 10 mg daily  Follow up appointments:  Your physician recommends that you schedule a follow-up appointment in:  6 months With Dr. Earlean Shawl will receive a reminder letter in the mail a few months in advance. If you don't receive a letter, please call our office to schedule the follow-up appointment.    Do the following things EVERYDAY: Weigh yourself in the morning before breakfast. Write it down and keep it in a log. Take your medicines as prescribed Eat low salt foods--Limit salt (sodium) to 2000 mg per day.  Stay as active as you can everyday Limit all fluids for the day to less than 2 liters   At the [facility], you and your health needs are our priority. As part of our continuing mission to provide you with exceptional heart care, we have created designated Provider Care Teams. These Care Teams include your primary Cardiologist (physician) and Advanced Practice Providers (APPs- Physician Assistants and Nurse Practitioners) who all work together to provide you with the care you need, when you need it.   You may see any of the following providers on your designated Care Team at your next follow up: Dr Arvilla Meres Dr Marca Ancona Dr. Marcos Eke, NP Robbie Lis, [place] [facility] [place], [place] Brynda Peon, NP Karle Plumber, PharmD   Please be sure to bring in all your medications bottles to every appointment.    Thank you for choosing [facility]  If you have any questions or concerns before your next appointment please send [place] a message through [place] or call our office at [phone].    TO LEAVE A MESSAGE FOR THE NURSE SELECT OPTION 2, PLEASE LEAVE A MESSAGE INCLUDING: YOUR NAME DATE OF BIRTH CALL BACK  NUMBER REASON FOR CALL**this is important as we prioritize the call backs  YOU WILL RECEIVE A CALL BACK THE SAME DAY AS LONG AS YOU CALL BEFORE 4:00 PM

## 2023-07-30 NOTE — Addendum Note (Signed)
Encounter addended by: [name], RN on: 07/30/2023 3:13 PM  Actions taken: Order list changed

## 2023-07-30 NOTE — Patient Instructions (Signed)
Continue warfarin 1 tablet (5mg  daily) daily except 2 tablets on Mondays, Wednesdays and Fridays.  Stay consistent with Boost drinks (1/2 bottle each day).  Recheck in 3 weeks If starting any new medications then please call.

## 2023-07-31 ENCOUNTER — Telehealth (HOSPITAL_COMMUNITY): Payer: Self-pay

## 2023-07-31 DIAGNOSIS — I5032 Chronic diastolic (congestive) heart failure: Secondary | ICD-10-CM

## 2023-07-31 MED ORDER — FUROSEMIDE 40 MG PO TABS: ORAL_TABLET | ORAL | Status: DC

## 2023-07-31 NOTE — Telephone Encounter (Signed)
-----   Message from Alen Bleacher sent at 07/31/2023  7:09 AM EDT ----- Renal function elevated. Hold lasix for 2 days then restart at 60mg  BID. Repeat BMET in 1 week

## 2023-08-01 ENCOUNTER — Ambulatory Visit: Payer: Medicare HMO | Attending: Family Medicine

## 2023-08-01 DIAGNOSIS — R600 Localized edema: Secondary | ICD-10-CM

## 2023-08-01 DIAGNOSIS — I739 Peripheral vascular disease, unspecified: Secondary | ICD-10-CM | POA: Diagnosis not present

## 2023-08-01 LAB — VAS US ABI WITH/WO TBI

## 2023-08-02 ENCOUNTER — Inpatient Hospital Stay: Payer: Medicare HMO | Admitting: Hematology and Oncology

## 2023-08-02 ENCOUNTER — Inpatient Hospital Stay: Payer: Medicare HMO

## 2023-08-02 ENCOUNTER — Other Ambulatory Visit: Payer: Self-pay | Admitting: *Deleted

## 2023-08-02 ENCOUNTER — Other Ambulatory Visit: Payer: Self-pay

## 2023-08-02 VITALS — BP 160/47 | HR 49 | Temp 97.3°F | Resp 18

## 2023-08-02 VITALS — BP 146/52 | HR 55 | Temp 97.5°F | Resp 18 | Wt 107.8 lb

## 2023-08-02 DIAGNOSIS — D508 Other iron deficiency anemias: Secondary | ICD-10-CM

## 2023-08-02 DIAGNOSIS — D649 Anemia, unspecified: Secondary | ICD-10-CM

## 2023-08-02 DIAGNOSIS — Z66 Do not resuscitate: Secondary | ICD-10-CM | POA: Diagnosis not present

## 2023-08-02 DIAGNOSIS — E1151 Type 2 diabetes mellitus with diabetic peripheral angiopathy without gangrene: Secondary | ICD-10-CM | POA: Diagnosis present

## 2023-08-02 DIAGNOSIS — D638 Anemia in other chronic diseases classified elsewhere: Secondary | ICD-10-CM | POA: Diagnosis not present

## 2023-08-02 DIAGNOSIS — E860 Dehydration: Secondary | ICD-10-CM | POA: Diagnosis not present

## 2023-08-02 DIAGNOSIS — Z7901 Long term (current) use of anticoagulants: Secondary | ICD-10-CM | POA: Diagnosis not present

## 2023-08-02 DIAGNOSIS — I272 Pulmonary hypertension, unspecified: Secondary | ICD-10-CM | POA: Diagnosis not present

## 2023-08-02 DIAGNOSIS — D591 Autoimmune hemolytic anemia, unspecified: Secondary | ICD-10-CM

## 2023-08-02 DIAGNOSIS — Z952 Presence of prosthetic heart valve: Secondary | ICD-10-CM | POA: Diagnosis not present

## 2023-08-02 DIAGNOSIS — I5032 Chronic diastolic (congestive) heart failure: Secondary | ICD-10-CM | POA: Diagnosis not present

## 2023-08-02 DIAGNOSIS — M109 Gout, unspecified: Secondary | ICD-10-CM | POA: Diagnosis not present

## 2023-08-02 DIAGNOSIS — R112 Nausea with vomiting, unspecified: Secondary | ICD-10-CM | POA: Diagnosis not present

## 2023-08-02 DIAGNOSIS — I480 Paroxysmal atrial fibrillation: Secondary | ICD-10-CM | POA: Diagnosis not present

## 2023-08-02 DIAGNOSIS — E872 Acidosis, unspecified: Secondary | ICD-10-CM | POA: Diagnosis not present

## 2023-08-02 DIAGNOSIS — N3090 Cystitis, unspecified without hematuria: Secondary | ICD-10-CM | POA: Diagnosis not present

## 2023-08-02 DIAGNOSIS — N184 Chronic kidney disease, stage 4 (severe): Secondary | ICD-10-CM | POA: Diagnosis not present

## 2023-08-02 DIAGNOSIS — R791 Abnormal coagulation profile: Secondary | ICD-10-CM | POA: Diagnosis not present

## 2023-08-02 DIAGNOSIS — I10 Essential (primary) hypertension: Secondary | ICD-10-CM | POA: Diagnosis not present

## 2023-08-02 DIAGNOSIS — I130 Hypertensive heart and chronic kidney disease with heart failure and stage 1 through stage 4 chronic kidney disease, or unspecified chronic kidney disease: Secondary | ICD-10-CM | POA: Diagnosis present

## 2023-08-02 DIAGNOSIS — K5730 Diverticulosis of large intestine without perforation or abscess without bleeding: Secondary | ICD-10-CM | POA: Diagnosis not present

## 2023-08-02 DIAGNOSIS — B961 Klebsiella pneumoniae [K. pneumoniae] as the cause of diseases classified elsewhere: Secondary | ICD-10-CM | POA: Diagnosis not present

## 2023-08-02 DIAGNOSIS — I2510 Atherosclerotic heart disease of native coronary artery without angina pectoris: Secondary | ICD-10-CM | POA: Diagnosis present

## 2023-08-02 DIAGNOSIS — E1122 Type 2 diabetes mellitus with diabetic chronic kidney disease: Secondary | ICD-10-CM | POA: Diagnosis not present

## 2023-08-02 DIAGNOSIS — N3080 Other cystitis without hematuria: Secondary | ICD-10-CM | POA: Diagnosis not present

## 2023-08-02 DIAGNOSIS — K449 Diaphragmatic hernia without obstruction or gangrene: Secondary | ICD-10-CM | POA: Diagnosis not present

## 2023-08-02 DIAGNOSIS — N179 Acute kidney failure, unspecified: Secondary | ICD-10-CM | POA: Diagnosis not present

## 2023-08-02 DIAGNOSIS — F17210 Nicotine dependence, cigarettes, uncomplicated: Secondary | ICD-10-CM | POA: Diagnosis present

## 2023-08-02 DIAGNOSIS — I708 Atherosclerosis of other arteries: Secondary | ICD-10-CM | POA: Diagnosis present

## 2023-08-02 DIAGNOSIS — I7782 Antineutrophilic cytoplasmic antibody (ANCA) vasculitis: Secondary | ICD-10-CM | POA: Diagnosis not present

## 2023-08-02 DIAGNOSIS — D631 Anemia in chronic kidney disease: Secondary | ICD-10-CM | POA: Diagnosis not present

## 2023-08-02 DIAGNOSIS — Z794 Long term (current) use of insulin: Secondary | ICD-10-CM | POA: Diagnosis not present

## 2023-08-02 DIAGNOSIS — R1084 Generalized abdominal pain: Secondary | ICD-10-CM | POA: Diagnosis not present

## 2023-08-02 DIAGNOSIS — D500 Iron deficiency anemia secondary to blood loss (chronic): Secondary | ICD-10-CM

## 2023-08-02 DIAGNOSIS — K8689 Other specified diseases of pancreas: Secondary | ICD-10-CM | POA: Diagnosis not present

## 2023-08-02 DIAGNOSIS — E119 Type 2 diabetes mellitus without complications: Secondary | ICD-10-CM | POA: Diagnosis not present

## 2023-08-02 DIAGNOSIS — E7800 Pure hypercholesterolemia, unspecified: Secondary | ICD-10-CM | POA: Diagnosis present

## 2023-08-02 DIAGNOSIS — Z79899 Other long term (current) drug therapy: Secondary | ICD-10-CM | POA: Diagnosis not present

## 2023-08-02 DIAGNOSIS — N2889 Other specified disorders of kidney and ureter: Secondary | ICD-10-CM | POA: Diagnosis not present

## 2023-08-02 LAB — CMP (CANCER CENTER ONLY)
ALT: 12 U/L (ref 0–44)
AST: 22 U/L (ref 15–41)
Albumin: 3.6 g/dL (ref 3.5–5.0)
Alkaline Phosphatase: 82 U/L (ref 38–126)
Anion gap: 10 (ref 5–15)
BUN: 132 mg/dL — ABNORMAL HIGH (ref 8–23)
CO2: 21 mmol/L — ABNORMAL LOW (ref 22–32)
Calcium: 8.7 mg/dL — ABNORMAL LOW (ref 8.9–10.3)
Chloride: 102 mmol/L (ref 98–111)
Creatinine: 3.37 mg/dL — ABNORMAL HIGH (ref 0.44–1.00)
GFR, Estimated: 13 mL/min — ABNORMAL LOW (ref >60)
Glucose, Bld: 213 mg/dL — ABNORMAL HIGH (ref 70–99)
Potassium: 5.5 mmol/L — ABNORMAL HIGH (ref 3.5–5.1)
Sodium: 133 mmol/L — ABNORMAL LOW (ref 135–145)
Total Bilirubin: 0.4 mg/dL (ref 0.3–1.2)
Total Protein: 5.8 g/dL — ABNORMAL LOW (ref 6.5–8.1)

## 2023-08-02 LAB — CBC WITH DIFFERENTIAL (CANCER CENTER ONLY)
Abs Immature Granulocytes: 0.02 10*3/uL (ref 0.00–0.07)
Basophils Absolute: 0.1 10*3/uL (ref 0.0–0.1)
Basophils Relative: 1 %
Eosinophils Absolute: 0.3 10*3/uL (ref 0.0–0.5)
Eosinophils Relative: 5 %
HCT: 21.5 % — ABNORMAL LOW (ref 36.0–46.0)
Hemoglobin: 6.9 g/dL — CL (ref 12.0–15.0)
Immature Granulocytes: 0 %
Lymphocytes Relative: 20 %
Lymphs Abs: 1.3 10*3/uL (ref 0.7–4.0)
MCH: 30.8 pg (ref 26.0–34.0)
MCHC: 32.1 g/dL (ref 30.0–36.0)
MCV: 96 fL (ref 80.0–100.0)
Monocytes Absolute: 0.4 10*3/uL (ref 0.1–1.0)
Monocytes Relative: 6 %
Neutro Abs: 4.1 10*3/uL (ref 1.7–7.7)
Neutrophils Relative %: 68 %
Platelet Count: 101 10*3/uL — ABNORMAL LOW (ref 150–400)
RBC: 2.24 MIL/uL — ABNORMAL LOW (ref 3.87–5.11)
RDW: 16.8 % — ABNORMAL HIGH (ref 11.5–15.5)
WBC Count: 6.1 10*3/uL (ref 4.0–10.5)
nRBC: 0 % (ref 0.0–0.2)

## 2023-08-02 LAB — SAMPLE TO BLOOD BANK

## 2023-08-02 LAB — RETICULOCYTES
Immature Retic Fract: 4.8 % (ref 2.3–15.9)
RBC.: 2.25 MIL/uL — ABNORMAL LOW (ref 3.87–5.11)
Retic Count, Absolute: 57.4 10*3/uL (ref 19.0–186.0)
Retic Ct Pct: 2.6 % (ref 0.4–3.1)

## 2023-08-02 LAB — PREPARE RBC (CROSSMATCH)

## 2023-08-02 LAB — TOTAL PROTEIN, URINE DIPSTICK: Protein, ur: 100 mg/dL — AB

## 2023-08-02 MED ORDER — DIPHENHYDRAMINE HCL 25 MG PO CAPS
25.0000 mg | ORAL_CAPSULE | Freq: Once | ORAL | Status: AC
  Administered 2023-08-02: 25 mg via ORAL
  Filled 2023-08-02: qty 1

## 2023-08-02 MED ORDER — EPOETIN ALFA-EPBX 40000 UNIT/ML IJ SOLN
40000.0000 [IU] | Freq: Once | INTRAMUSCULAR | Status: AC
  Administered 2023-08-02: 40000 [IU] via SUBCUTANEOUS
  Filled 2023-08-02: qty 1

## 2023-08-02 MED ORDER — ACETAMINOPHEN 325 MG PO TABS
650.0000 mg | ORAL_TABLET | Freq: Once | ORAL | Status: AC
  Administered 2023-08-02: 650 mg via ORAL
  Filled 2023-08-02: qty 2

## 2023-08-02 MED ORDER — SODIUM CHLORIDE 0.9% IV SOLUTION
250.0000 mL | INTRAVENOUS | Status: DC
  Administered 2023-08-02: 250 mL via INTRAVENOUS

## 2023-08-02 NOTE — Progress Notes (Signed)
[facility] Cancer Follow up:    [name], [name] Koh, MD [address] [place] [address]   DIAGNOSIS: Multifactorial Anemia  SUMMARY OF HEMATOLOGIC HISTORY: with multifactorial anemia, as follows:             (a) anemia of chronic inflammation: As of May 2020 she has a SED rate of 95, CRP 1.1, positive ANA and RF; subsequently she was found to be ANCA positive             (b) hemolysis (mild): she has a mildly elevated LDH and a DAT positive for complement, not IgG; this is c/w (a) above             (c) anemia of renal insufficiency: inadequate EPO resonse to anemia                         (d) GIB/ iron deficiency in patient on lifelong anticoagulaton   (1) Feraheme received 03/01/2019, repeated 03/20/2019              (a) reticulocyte 03/07/2019 up from 43.4 a year ago to 191.8 after iron infusion             (b) feraheme repeated on 05/12/2019             (c) subsequent ferritin levels >100             (D) Ferrliceit 250 mg last given 06/25/22   (2) darbepoetin: starting 05/05/2019 at given every 2 weeks             (a) dose increased to 200 mcg every week starting 07/01/2019              (b) back to every 2 weeks beginning 08/04/2019 still at 200 mcg dose             (c) switched to Retacrit every 4 weeks as of 08/18/2019   (3) ANCA positive vasculitis: diagnosed 05/08/2019 (titer 1:640)             (a) prednisone 40 mg daily started 05/31/2019             (b) rituximab weekly started 06/12/2019 (received test dose 06/05/2019)                         (i) hepatitis B sAg, core Ab and HIV negative 05/08/2019                         (ii) rituximab held after 07/14/2019 dose (received 5 weekly doses) with neutropenia                         (iii) rituximab resumed 08/18/2019 but changed to monthly                         (iv) rituximab changed to every 2 months beginning with 02/02/2020 dose                         (v) rituximab changed to every 12 weeks  after the 10/12/2020 dose             (c) prednisone tapered to off as of 09/15/2019                          (  4) Anemia worsened in spring 2023, refractory to Rituximab and requiring prednisone restart with slow taper. This was completed in March 2024 per chart review.   (5) Bone marrow biopsy 02/20/2022: normocellular marrow with trilineage hematopoiesis, CT C/A/P on the same day did not show an underlying cause for anemia.     (5) osteopenia with a T score of -1.5 on bone density 02/28/2016 at the breast center  CURRENT THERAPY: Retacrit  INTERVAL HISTORY: [name] 82 y.o. female returns for follow-up and evaluation of her multifactorial anemia.  She is doing well overall except for a recent UTI and some hematuria associated with it.  She had some labs at the cardiologist office which suggested worsening anemia, she had her last EPO shot on 16 Feb 2023.  Besides the hematuria and a urinary tract infection, she denies any new health complaints.  She denies any other bleeding otherwise.  She has met with the nephrologist today and her creatinine continues to worsen.  She is taking her warfarin, INR in the therapeutic range.  No worsening lower extremity swelling.  Rest of the pertinent 10 point ROS reviewed and negative   Patient Active Problem List   Diagnosis Date Noted   Chronic cough 03/26/2023   Protein-calorie malnutrition, severe 12/26/2022   Acute GI bleeding 12/24/2022   Coagulopathy (HCC) 12/24/2022   Melena 12/24/2022   Duodenitis 12/24/2022   Noninfectious gastroenteritis 12/24/2022   CAD, multiple vessel 12/24/2022   Acute on chronic heart failure with preserved ejection fraction (HFpEF) (HCC) 08/29/2022   Acute exacerbation of CHF (congestive heart failure) (HCC) 08/29/2022   Acute urinary retention 08/28/2022   Acute kidney injury superimposed on chronic kidney disease (HCC) 08/27/2022   Constipation 08/27/2022   Nausea and vomiting 08/27/2022   Hypoalbuminemia  due to protein-calorie malnutrition (HCC) 08/27/2022   Type 2 diabetes mellitus with hyperglycemia (HCC) 08/27/2022   Palliative care encounter    Goals of care, counseling/discussion    Encounter for therapeutic drug monitoring    GI bleed 07/09/2022   GI bleeding 07/08/2022   Acute on chronic anemia 07/08/2022   History of Clostridioides difficile colitis 07/08/2022   DNR (do not resuscitate)/DNI(Do Not Intubate) 07/08/2022   Anemia of chronic disease 07/07/2022   Pressure injury of skin 06/23/2022   UTI (urinary tract infection) 06/22/2022   Joint pain 06/09/2022   Parietoalveolar pneumopathy (HCC) 06/09/2022   Primary osteoarthritis 06/09/2022   Rheumatoid factor positive 06/09/2022   C. difficile colitis 04/19/2022   Colitis 04/18/2022   PAH (pulmonary artery hypertension) (HCC) 04/18/2022   Emphysematous cystitis 04/13/2022   Female bladder prolapse 04/13/2022   Diverticulitis 04/12/2022   Autoimmune hemolytic anemia (HCC) 01/02/2022   Bilateral lower extremity edema 01/02/2022   Refractory anemia (HCC) 12/15/2021   Abnormal chest x-ray 12/15/2021   Supratherapeutic INR 12/15/2021   Elevated troponin 12/15/2021   Unilateral primary osteoarthritis, right hip 05/31/2021   Midline cystocele 05/30/2021   Primary localized osteoarthritis of pelvic region and thigh 05/30/2021   Vaginal pain 05/30/2021   Asymmetric SNHL (sensorineural hearing loss) 01/06/2021   Referred otalgia of both ears 01/06/2021   Bilateral hearing loss 08/31/2020   Low back pain 02/20/2020   CKD (chronic kidney disease), stage IV (HCC) 10/13/2019   Leukopenia due to antineoplastic chemotherapy (HCC) 08/18/2019   Thrombophilia (HCC) 06/19/2019   Encounter for general adult medical examination without abnormal findings 06/13/2019   Arteritis (HCC) 06/06/2019   Hypertensive heart and chronic kidney disease with heart failure and stage  1 through stage 4 chronic kidney disease, or unspecified chronic kidney  disease (HCC) 06/06/2019   Muscle weakness 06/06/2019   Pancytopenia (HCC) 05/28/2019   Dyspnea 05/28/2019   Vasculitis, ANCA positive 05/28/2019   Disorder of connective tissue (HCC) 05/14/2019   Renal insufficiency 05/08/2019   PAF (paroxysmal atrial fibrillation) (HCC) 04/07/2019   IDA (iron deficiency anemia) 02/28/2019   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 (HCC) 02/28/2019   Diabetic retinopathy associated with type 2 diabetes mellitus (HCC) 09/02/2018   Chronic pain 06/19/2018   Non-thrombocytopenic purpura (HCC) 05/21/2018   Gout 03/22/2018   Malnutrition of moderate degree 03/12/2018   Diabetes mellitus type 2, controlled (HCC) 03/06/2018   Chronic atrial fibrillation (HCC) 03/06/2018   H/O mechanical aortic valve replacement 03/06/2018   Moderate to severe pulmonary hypertension (HCC) 03/06/2018   GERD without esophagitis 02/21/2018   Anxiety 02/21/2018   Chronic diastolic CHF (congestive heart failure) (HCC) 02/21/2018   General weakness 02/02/2018   Thrombocytopenia (HCC) 01/25/2018   Arm pain, diffuse 01/25/2018   Anemia 01/23/2018   Aortic atherosclerosis (HCC) 01/23/2018   Age-related osteoporosis without current pathological fracture 05/24/2017   Carotid artery occlusion 05/24/2017   Generalized anxiety disorder 05/24/2017   Hearing loss 05/24/2017   Presence of prosthetic heart valve 05/24/2017   Carotid artery disease (HCC) 08/18/2013   Peripheral arterial disease (HCC) 08/18/2013   Diabetes mellitus without complication (HCC) 08/18/2013   Essential hypertension    Hypercholesteremia    Mechanical heart valve present    Chronic anticoagulation     is allergic to augmentin [amoxicillin-pot clavulanate], chlorthalidone, ciprofloxacin, codeine, coreg [carvedilol], diflucan [fluconazole], escitalopram, levofloxacin in d5w, losartan, niacin, privigen [immune globulin (human)], sertraline, sulfamethoxazole-trimethoprim, verapamil, zetia  [ezetimibe], zocor [simvastatin - high dose], and gatifloxacin.  MEDICAL HISTORY: Past Medical History:  Diagnosis Date   Aortic atherosclerosis (HCC) 01/23/2018   Atrial fibrillation (HCC)    Benign positional vertigo 10/06/2011   Carotid artery disease (HCC)    Chemotherapy induced neutropenia (HCC) 07/21/2019   Cholelithiasis 01/23/2018   Cholelithiasis 01/23/2018   Chronic anticoagulation    Colovesical fistula, ruled out 04/14/2022   Coronary artery disease    status post coronary artery bypass grafting times 07/10/2004   Diabetes mellitus    Hypercholesteremia    Hypertension    Mechanical heart valve present    H. aortic valve replacement at the time of bypass surgery October 2005   Moderate to severe pulmonary hypertension (HCC)    Peripheral arterial disease (HCC)    history of left common iliac artery PTA and stenting for a chronic total occlusion 08/26/01   S/P cholecystectomy 03/06/2018    SURGICAL HISTORY: Past Surgical History:  Procedure Laterality Date   anterior repair  2009   AORTIC VALVE REPLACEMENT  2005   Dr. Laneta Simmers   CARDIAC CATHETERIZATION  11/10/2004   40% right common illiac, 70% in stent restenosis of distal left common illiac,    CARDIAC CATHETERIZATION  05/18/2004   LAD 50-70% midstenosis, RCA dominant w/50% stenosis, 50% Right common Illiac artery ostial stenosis, 90% in stent restenosis within midportion of left common illiac stent   Carotid Duplex  03/12/2012   RSA-elev. velocities suggestive of a 50-69% diameter reduction, Right&Left Bulb/Prox ICA-mild-mod.fibrous plaqueelevating Velocities abnormal study.   CHOLECYSTECTOMY N/A 03/01/2018   Procedure: LAPAROSCOPIC CHOLECYSTECTOMY;  Surgeon: Jimmye Norman, MD;  Location: [facility] OR;  Service: General;  Laterality: N/A;   COLONOSCOPY WITH PROPOFOL N/A 01/22/2018   Procedure: COLONOSCOPY WITH PROPOFOL;  Surgeon: Bosie Clos,  Oswaldo Done, MD;  Location: [facility] ENDOSCOPY;  Service: Endoscopy;  Laterality: N/A;    ESOPHAGOGASTRODUODENOSCOPY N/A 07/13/2022   Procedure: ESOPHAGOGASTRODUODENOSCOPY (EGD);  Surgeon: Kerin Salen, MD;  Location: [facility] ENDOSCOPY;  Service: Gastroenterology;  Laterality: N/A;   ESOPHAGOGASTRODUODENOSCOPY (EGD) WITH PROPOFOL N/A 01/22/2018   Procedure: ESOPHAGOGASTRODUODENOSCOPY (EGD) WITH PROPOFOL;  Surgeon: Charlott Rakes, MD;  Location: [facility] ENDOSCOPY;  Service: Endoscopy;  Laterality: N/A;   ESOPHAGOGASTRODUODENOSCOPY (EGD) WITH PROPOFOL N/A 11/21/2018   Procedure: ESOPHAGOGASTRODUODENOSCOPY (EGD) WITH PROPOFOL;  Surgeon: Kerin Salen, MD;  Location: [facility] ENDOSCOPY;  Service: Gastroenterology;  Laterality: N/A;   ESOPHAGOGASTRODUODENOSCOPY (EGD) WITH PROPOFOL Left 02/27/2019   Procedure: ESOPHAGOGASTRODUODENOSCOPY (EGD) WITH PROPOFOL;  Surgeon: Willis Modena, MD;  Location: [facility] ENDOSCOPY;  Service: Endoscopy;  Laterality: Left;   GIVENS CAPSULE STUDY N/A 11/21/2018   Procedure: GIVENS CAPSULE STUDY;  Surgeon: Kerin Salen, MD;  Location: [facility] ENDOSCOPY;  Service: Gastroenterology;  Laterality: N/A;  To be deployed during EGD   Lower Ext. Duplex  03/12/2012   Right Proximal CIA- vessel narrowing w/elevated velocities 0-49% diameter reduction. Right SFA-mild mixed density plaque throughout vessel.   NM MYOCAR PERF WALL MOTION  05/19/2010   protocol: Persantine, post stress EF 65%, negative for ischemia, low risk scan   RIGHT HEART CATH N/A 06/27/2018   Procedure: RIGHT HEART CATH;  Surgeon: Laurey Morale, MD;  Location: [facility] INVASIVE CV LAB;  Service: Cardiovascular;  Laterality: N/A;   TOTAL ABDOMINAL HYSTERECTOMY W/ BILATERAL SALPINGOOPHORECTOMY  1989   TRANSTHORACIC ECHOCARDIOGRAM  08/29/2012   Moderately calcified annulus of mitral valve, moderate regurg. of both mitral valve and tricuspid valve.     SOCIAL HISTORY: Social History   Socioeconomic History   Marital status: Widowed    Spouse name: Not on file   Number of children: Not on file   Years of education: Not on file    Highest education level: Not on file  Occupational History   Occupation: Retired  Tobacco Use   Smoking status: Former    Current packs/day: 1.00    Average packs/day: 1 pack/day for 30.0 years (30.0 ttl pk-yrs)    Types: Cigarettes   Smokeless tobacco: Never   Tobacco comments:    quit smoking 2005  Vaping Use   Vaping status: Never Used  Substance and Sexual Activity   Alcohol use: No    Alcohol/week: 0.0 standard drinks of alcohol   Drug use: No   Sexual activity: Not Currently    Birth control/protection: None  Other Topics Concern   Not on file  Social History Narrative   Not on file   Social Determinants of Health   Financial Resource Strain: Low Risk  (02/28/2022)   Overall Financial Resource Strain (CARDIA)    Difficulty of Paying Living Expenses: Not hard at all  Food Insecurity: No Food Insecurity (12/25/2022)   Hunger Vital Sign    Worried About Running Out of Food in the Last Year: Never true    Ran Out of Food in the Last Year: Never true  Transportation Needs: No Transportation Needs (12/25/2022)   PRAPARE - Administrator, Civil Service (Medical): No    Lack of Transportation (Non-Medical): No  Physical Activity: Inactive (02/28/2022)   Exercise Vital Sign    Days of Exercise per Week: 0 days    Minutes of Exercise per Session: 0 min  Stress: No Stress Concern Present (02/28/2022)   [employer] of Occupational Health - Occupational Stress Questionnaire    Feeling of Stress : Only  a little  Social Connections: Moderately Integrated (06/22/2022)   Social Connection and Isolation Panel [NHANES]    Frequency of Communication with Friends and Family: More than three times a week    Frequency of Social Gatherings with Friends and Family: More than three times a week    Attends Religious Services: More than 4 times per year    Active Member of [employer] or Organizations: Yes    Attends Banker Meetings: More than 4 times per year     Marital Status: Widowed  Intimate Partner Violence: Not At Risk (12/25/2022)   Humiliation, Afraid, Rape, and Kick questionnaire    Fear of Current or Ex-Partner: No    Emotionally Abused: No    Physically Abused: No    Sexually Abused: No    FAMILY HISTORY: Family History  Problem Relation Age of Onset   Heart disease Father    Hypertension Father    Hyperlipidemia Father    Breast cancer Neg Hx     Review of Systems  Constitutional:  Positive for fatigue. Negative for appetite change, chills, fever and unexpected weight change.  HENT:   Negative for hearing loss, lump/mass and trouble swallowing.   Eyes:  Negative for eye problems and icterus.  Respiratory:  Negative for chest tightness, cough and shortness of breath.   Cardiovascular:  Positive for leg swelling (at baseline). Negative for chest pain and palpitations.  Gastrointestinal:  Negative for abdominal distention, abdominal pain, constipation, diarrhea, nausea and vomiting.  Endocrine: Negative for hot flashes.  Genitourinary:  Negative for difficulty urinating.   Musculoskeletal:  Negative for arthralgias.  Skin:  Negative for itching and rash.  Neurological:  Negative for dizziness, extremity weakness, headaches and numbness.  Hematological:  Negative for adenopathy. Bruises/bleeds easily (easy bruising on coumadin).  Psychiatric/Behavioral:  Negative for depression. The patient is not nervous/anxious.       PHYSICAL EXAMINATION  Vitals:   08/02/23 1140  BP: (!) 146/52  Pulse: (!) 55  Resp: 18  Temp: (!) 97.5 F (36.4 C)  SpO2: 100%    Physical Exam Constitutional:      General: She is not in acute distress.    Appearance: Normal appearance. She is not toxic-appearing.  HENT:     Head: Normocephalic and atraumatic.     Mouth/Throat:     Mouth: Mucous membranes are moist.     Pharynx: Oropharynx is clear. No oropharyngeal exudate or posterior oropharyngeal erythema.  Eyes:     General: No scleral  icterus. Cardiovascular:     Pulses: Normal pulses.     Heart sounds: Normal heart sounds.  Pulmonary:     Effort: Pulmonary effort is normal.     Breath sounds: Normal breath sounds.  Abdominal:     General: Abdomen is flat. Bowel sounds are normal. There is no distension.     Palpations: Abdomen is soft.     Tenderness: There is no abdominal tenderness.  Musculoskeletal:        General: Swelling (1-2+ bilateral lower extremity) present.     Cervical back: Neck supple.  Lymphadenopathy:     Cervical: No cervical adenopathy.  Skin:    General: Skin is warm and dry.     Findings: No rash.  Neurological:     General: No focal deficit present.     Mental Status: She is alert.  Psychiatric:        Mood and Affect: Mood normal.        Behavior: Behavior  normal.     LABORATORY DATA:  CBC    Component Value Date/Time   WBC 6.1 08/02/2023 1128   WBC 6.0 07/30/2023 1512   RBC 2.25 (L) 08/02/2023 1129   RBC 2.24 (L) 08/02/2023 1128   HGB 6.9 (LL) 08/02/2023 1128   HCT 21.5 (L) 08/02/2023 1128   HCT 28.5 (L) 11/20/2018 1824   PLT 101 (L) 08/02/2023 1128   MCV 96.0 08/02/2023 1128   MCH 30.8 08/02/2023 1128   MCHC 32.1 08/02/2023 1128   RDW 16.8 (H) 08/02/2023 1128   LYMPHSABS 1.3 08/02/2023 1128   MONOABS 0.4 08/02/2023 1128   EOSABS 0.3 08/02/2023 1128   BASOSABS 0.1 08/02/2023 1128    CMP     Component Value Date/Time   NA 133 (L) 08/02/2023 1128   NA 137 12/06/2017 1436   K 5.5 (H) 08/02/2023 1128   CL 102 08/02/2023 1128   CO2 21 (L) 08/02/2023 1128   GLUCOSE 213 (H) 08/02/2023 1128   BUN 132 (H) 08/02/2023 1128   BUN 20 12/06/2017 1436   CREATININE 3.37 (H) 08/02/2023 1128   CALCIUM 8.7 (L) 08/02/2023 1128   PROT 5.8 (L) 08/02/2023 1128   ALBUMIN 3.6 08/02/2023 1128   AST 22 08/02/2023 1128   ALT 12 08/02/2023 1128   ALKPHOS 82 08/02/2023 1128   BILITOT 0.4 08/02/2023 1128   GFRNONAA 13 (L) 08/02/2023 1128   GFRAA 32 (L) 03/30/2020 0932         ASSESSMENT and THERAPY PLAN:   MONICA FIGURA is a 82 y.o. female who presents for multifactorial anemia, previously treated with rituximab for ANCA positive hemolytic anemia, previously on maintenance rituximab every 12 weeks presented with worsening anemia thought to be related to her hemolysis.  She was diagnosed with elevated ANCA titers 1 is to 640 and previously treated for ANCA positive hemolytic anemia, seen by rheumatology Dr. Carleene Cooper back in 2020, responded at that time to prednisone and rituximab and continued on rituximab maintenance.  However her rituximab was changed to every 77-month infusions and she relapsed before her second cycle of every 65-month rituximab.    # Multifactorial Anemia:  --Bone marrow biopsy from 02/20/2022 showed normocellular marrow with trilineage hematopoiesis. No leukemia, lymphoma or high-grade dysplasia identified. --CT CAP from 02/20/2022 did not show any underlying cause for anemia.  --ANCA titers are normal when checked on 02/10/2022 -- Currently on EPO only every 2 weeks.  Today her hemoglobin is 6.9, she will receive a unit of packed red blood cell transfusion today. We will give her her EPO injection today, she has to return to clinic in about a week with repeat labs.  Reticulocyte count is normal, there is no active evidence of hemolysis.  I have requested some additional labs today.   #Bilateral lower extremity edema significantly improved.    #Warfarin therapy for St. Jude's valve --Under the care of  [facility].     # CKd stage IV, not an ideal candidate for dialysis.    All questions were answered. The patient knows to call the clinic with any problems, questions or concerns. We can certainly see the patient much sooner if necessary.  Total encounter time:20 minutes*in face-to-face visit time, chart review, lab review, care coordination, order entry, and documentation of the encounter time.   *Total Encounter Time as  defined by the Centers for Medicare and Medicaid Services includes, in addition to the face-to-face time of a patient visit (documented in the note above) non-face-to-face  time: obtaining and reviewing outside history, ordering and reviewing medications, tests or procedures, care coordination (communications with other health care professionals or caregivers) and documentation in the medical record.

## 2023-08-03 ENCOUNTER — Encounter: Payer: Self-pay | Admitting: Hematology and Oncology

## 2023-08-03 LAB — TYPE AND SCREEN
ABO/RH(D): A POS
Antibody Screen: NEGATIVE
Unit division: 0

## 2023-08-03 LAB — BPAM RBC
Blood Product Expiration Date: 202411142359
ISSUE DATE / TIME: 202410241323
Unit Type and Rh: 6200

## 2023-08-05 ENCOUNTER — Other Ambulatory Visit: Payer: Self-pay

## 2023-08-05 ENCOUNTER — Emergency Department (HOSPITAL_COMMUNITY): Payer: Medicare HMO

## 2023-08-05 ENCOUNTER — Inpatient Hospital Stay (HOSPITAL_COMMUNITY)
Admission: EM | Admit: 2023-08-05 | Discharge: 2023-08-09 | DRG: 690 | Disposition: A | Discharge status: Home / Self Care | Payer: Medicare HMO | Attending: Internal Medicine | Admitting: Internal Medicine

## 2023-08-05 DIAGNOSIS — Z9049 Acquired absence of other specified parts of digestive tract: Secondary | ICD-10-CM

## 2023-08-05 DIAGNOSIS — E7800 Pure hypercholesterolemia, unspecified: Secondary | ICD-10-CM | POA: Diagnosis present

## 2023-08-05 DIAGNOSIS — E1151 Type 2 diabetes mellitus with diabetic peripheral angiopathy without gangrene: Secondary | ICD-10-CM | POA: Diagnosis present

## 2023-08-05 DIAGNOSIS — E860 Dehydration: Secondary | ICD-10-CM | POA: Diagnosis present

## 2023-08-05 DIAGNOSIS — Z8249 Family history of ischemic heart disease and other diseases of the circulatory system: Secondary | ICD-10-CM

## 2023-08-05 DIAGNOSIS — Z66 Do not resuscitate: Secondary | ICD-10-CM | POA: Diagnosis present

## 2023-08-05 DIAGNOSIS — M109 Gout, unspecified: Secondary | ICD-10-CM | POA: Diagnosis present

## 2023-08-05 DIAGNOSIS — Z7982 Long term (current) use of aspirin: Secondary | ICD-10-CM

## 2023-08-05 DIAGNOSIS — R112 Nausea with vomiting, unspecified: Secondary | ICD-10-CM | POA: Diagnosis present

## 2023-08-05 DIAGNOSIS — Z952 Presence of prosthetic heart valve: Secondary | ICD-10-CM

## 2023-08-05 DIAGNOSIS — E872 Acidosis, unspecified: Secondary | ICD-10-CM | POA: Diagnosis present

## 2023-08-05 DIAGNOSIS — N179 Acute kidney failure, unspecified: Secondary | ICD-10-CM | POA: Diagnosis present

## 2023-08-05 DIAGNOSIS — Z794 Long term (current) use of insulin: Secondary | ICD-10-CM | POA: Diagnosis not present

## 2023-08-05 DIAGNOSIS — N3090 Cystitis, unspecified without hematuria: Principal | ICD-10-CM

## 2023-08-05 DIAGNOSIS — I5032 Chronic diastolic (congestive) heart failure: Secondary | ICD-10-CM | POA: Diagnosis present

## 2023-08-05 DIAGNOSIS — I10 Essential (primary) hypertension: Secondary | ICD-10-CM | POA: Diagnosis not present

## 2023-08-05 DIAGNOSIS — E1122 Type 2 diabetes mellitus with diabetic chronic kidney disease: Secondary | ICD-10-CM | POA: Diagnosis present

## 2023-08-05 DIAGNOSIS — I2510 Atherosclerotic heart disease of native coronary artery without angina pectoris: Secondary | ICD-10-CM | POA: Diagnosis present

## 2023-08-05 DIAGNOSIS — R791 Abnormal coagulation profile: Secondary | ICD-10-CM | POA: Diagnosis present

## 2023-08-05 DIAGNOSIS — F17210 Nicotine dependence, cigarettes, uncomplicated: Secondary | ICD-10-CM | POA: Diagnosis present

## 2023-08-05 DIAGNOSIS — Z83438 Family history of other disorder of lipoprotein metabolism and other lipidemia: Secondary | ICD-10-CM

## 2023-08-05 DIAGNOSIS — Z888 Allergy status to other drugs, medicaments and biological substances status: Secondary | ICD-10-CM

## 2023-08-05 DIAGNOSIS — Z880 Allergy status to penicillin: Secondary | ICD-10-CM

## 2023-08-05 DIAGNOSIS — I708 Atherosclerosis of other arteries: Secondary | ICD-10-CM | POA: Diagnosis present

## 2023-08-05 DIAGNOSIS — I272 Pulmonary hypertension, unspecified: Secondary | ICD-10-CM | POA: Diagnosis present

## 2023-08-05 DIAGNOSIS — Z90722 Acquired absence of ovaries, bilateral: Secondary | ICD-10-CM

## 2023-08-05 DIAGNOSIS — I130 Hypertensive heart and chronic kidney disease with heart failure and stage 1 through stage 4 chronic kidney disease, or unspecified chronic kidney disease: Secondary | ICD-10-CM | POA: Diagnosis present

## 2023-08-05 DIAGNOSIS — Z881 Allergy status to other antibiotic agents status: Secondary | ICD-10-CM

## 2023-08-05 DIAGNOSIS — E119 Type 2 diabetes mellitus without complications: Secondary | ICD-10-CM | POA: Diagnosis not present

## 2023-08-05 DIAGNOSIS — B961 Klebsiella pneumoniae [K. pneumoniae] as the cause of diseases classified elsewhere: Secondary | ICD-10-CM | POA: Diagnosis present

## 2023-08-05 DIAGNOSIS — Z90710 Acquired absence of both cervix and uterus: Secondary | ICD-10-CM

## 2023-08-05 DIAGNOSIS — Z79899 Other long term (current) drug therapy: Secondary | ICD-10-CM | POA: Diagnosis not present

## 2023-08-05 DIAGNOSIS — Z882 Allergy status to sulfonamides status: Secondary | ICD-10-CM

## 2023-08-05 DIAGNOSIS — Z7901 Long term (current) use of anticoagulants: Secondary | ICD-10-CM | POA: Diagnosis not present

## 2023-08-05 DIAGNOSIS — I480 Paroxysmal atrial fibrillation: Secondary | ICD-10-CM | POA: Diagnosis present

## 2023-08-05 DIAGNOSIS — N3080 Other cystitis without hematuria: Principal | ICD-10-CM | POA: Diagnosis present

## 2023-08-05 DIAGNOSIS — D631 Anemia in chronic kidney disease: Secondary | ICD-10-CM | POA: Diagnosis present

## 2023-08-05 DIAGNOSIS — N184 Chronic kidney disease, stage 4 (severe): Secondary | ICD-10-CM | POA: Diagnosis present

## 2023-08-05 DIAGNOSIS — Z951 Presence of aortocoronary bypass graft: Secondary | ICD-10-CM

## 2023-08-05 DIAGNOSIS — Z885 Allergy status to narcotic agent status: Secondary | ICD-10-CM

## 2023-08-05 DIAGNOSIS — Z9221 Personal history of antineoplastic chemotherapy: Secondary | ICD-10-CM

## 2023-08-05 LAB — CBC WITH DIFFERENTIAL/PLATELET
Abs Immature Granulocytes: 0.07 10*3/uL (ref 0.00–0.07)
Basophils Absolute: 0 10*3/uL (ref 0.0–0.1)
Basophils Relative: 0 %
Eosinophils Absolute: 0.2 10*3/uL (ref 0.0–0.5)
Eosinophils Relative: 1 %
HCT: 23.7 % — ABNORMAL LOW (ref 36.0–46.0)
Hemoglobin: 7.7 g/dL — ABNORMAL LOW (ref 12.0–15.0)
Immature Granulocytes: 1 %
Lymphocytes Relative: 9 %
Lymphs Abs: 1.2 10*3/uL (ref 0.7–4.0)
MCH: 31.7 pg (ref 26.0–34.0)
MCHC: 32.5 g/dL (ref 30.0–36.0)
MCV: 97.5 fL (ref 80.0–100.0)
Monocytes Absolute: 1 10*3/uL (ref 0.1–1.0)
Monocytes Relative: 8 %
Neutro Abs: 10.7 10*3/uL — ABNORMAL HIGH (ref 1.7–7.7)
Neutrophils Relative %: 81 %
Platelets: 120 10*3/uL — ABNORMAL LOW (ref 150–400)
RBC: 2.43 MIL/uL — ABNORMAL LOW (ref 3.87–5.11)
RDW: 16.4 % — ABNORMAL HIGH (ref 11.5–15.5)
WBC: 13.1 10*3/uL — ABNORMAL HIGH (ref 4.0–10.5)
nRBC: 0 % (ref 0.0–0.2)

## 2023-08-05 LAB — URINALYSIS, W/ REFLEX TO CULTURE (INFECTION SUSPECTED)
Bilirubin Urine: NEGATIVE
Glucose, UA: NEGATIVE mg/dL
Ketones, ur: NEGATIVE mg/dL
Nitrite: NEGATIVE
Protein, ur: 100 mg/dL — AB
RBC / HPF: >50 RBC/hpf (ref 0–5)
Specific Gravity, Urine: 1.012 (ref 1.005–1.030)
pH: 5 (ref 5.0–8.0)

## 2023-08-05 LAB — COMPREHENSIVE METABOLIC PANEL
ALT: 16 U/L (ref 0–44)
AST: 32 U/L (ref 15–41)
Albumin: 3.3 g/dL — ABNORMAL LOW (ref 3.5–5.0)
Alkaline Phosphatase: 97 U/L (ref 38–126)
Anion gap: 11 (ref 5–15)
BUN: 117 mg/dL — ABNORMAL HIGH (ref 8–23)
CO2: 18 mmol/L — ABNORMAL LOW (ref 22–32)
Calcium: 8.7 mg/dL — ABNORMAL LOW (ref 8.9–10.3)
Chloride: 103 mmol/L (ref 98–111)
Creatinine, Ser: 3.49 mg/dL — ABNORMAL HIGH (ref 0.44–1.00)
GFR, Estimated: 12 mL/min — ABNORMAL LOW (ref >60)
Glucose, Bld: 161 mg/dL — ABNORMAL HIGH (ref 70–99)
Potassium: 4.6 mmol/L (ref 3.5–5.1)
Sodium: 132 mmol/L — ABNORMAL LOW (ref 135–145)
Total Bilirubin: 0.7 mg/dL (ref 0.3–1.2)
Total Protein: 5.8 g/dL — ABNORMAL LOW (ref 6.5–8.1)

## 2023-08-05 LAB — CBG MONITORING, ED: Glucose-Capillary: 158 mg/dL — ABNORMAL HIGH (ref 70–99)

## 2023-08-05 LAB — PROTIME-INR
INR: 8 (ref 0.8–1.2)
Prothrombin Time: 67.1 s — ABNORMAL HIGH (ref 11.4–15.2)

## 2023-08-05 MED ORDER — ONDANSETRON HCL 4 MG/2ML IJ SOLN
4.0000 mg | Freq: Once | INTRAMUSCULAR | Status: AC
  Administered 2023-08-05: 4 mg via INTRAVENOUS
  Filled 2023-08-05: qty 2

## 2023-08-05 MED ORDER — SODIUM CHLORIDE 0.9 % IV SOLN
2.0000 g | Freq: Once | INTRAVENOUS | Status: AC
  Administered 2023-08-05: 2 g via INTRAVENOUS
  Filled 2023-08-05: qty 20

## 2023-08-05 MED ORDER — MORPHINE SULFATE (PF) 2 MG/ML IV SOLN: 2.0000 mg | INTRAVENOUS | Status: DC | PRN

## 2023-08-05 MED ORDER — INSULIN ASPART 100 UNIT/ML IJ SOLN: 0.0000 [IU] | Freq: Every day | INTRAMUSCULAR | Status: DC

## 2023-08-05 MED ORDER — PHYTONADIONE 5 MG PO TABS
5.0000 mg | ORAL_TABLET | Freq: Once | ORAL | Status: AC
  Administered 2023-08-05: 5 mg via ORAL
  Filled 2023-08-05: qty 1

## 2023-08-05 MED ORDER — HYDROMORPHONE HCL 1 MG/ML IJ SOLN
0.5000 mg | INTRAMUSCULAR | Status: DC | PRN
  Administered 2023-08-06: 0.5 mg via INTRAVENOUS
  Filled 2023-08-05: qty 0.5

## 2023-08-05 MED ORDER — MORPHINE SULFATE (PF) 4 MG/ML IV SOLN
4.0000 mg | Freq: Once | INTRAVENOUS | Status: AC
  Administered 2023-08-05: 4 mg via INTRAVENOUS
  Filled 2023-08-05: qty 1

## 2023-08-05 MED ORDER — SODIUM CHLORIDE 0.9 % IV SOLN: INTRAVENOUS | Status: AC

## 2023-08-05 MED ORDER — INSULIN ASPART 100 UNIT/ML IJ SOLN
0.0000 [IU] | Freq: Three times a day (TID) | INTRAMUSCULAR | Status: DC
  Administered 2023-08-06 (×2): 1 [IU] via SUBCUTANEOUS
  Administered 2023-08-06: 2 [IU] via SUBCUTANEOUS
  Administered 2023-08-07: 1 [IU] via SUBCUTANEOUS
  Administered 2023-08-07 (×2): 2 [IU] via SUBCUTANEOUS
  Administered 2023-08-08: 1 [IU] via SUBCUTANEOUS
  Administered 2023-08-08: 2 [IU] via SUBCUTANEOUS
  Administered 2023-08-08: 1 [IU] via SUBCUTANEOUS
  Administered 2023-08-09: 2 [IU] via SUBCUTANEOUS

## 2023-08-05 NOTE — Assessment & Plan Note (Signed)
Rate controlled on anticoagulation with warfarin.  Not on rate limiting drugs.

## 2023-08-05 NOTE — Assessment & Plan Note (Addendum)
Presenting with nausea vomiting and abdominal pain.  Leukocytosis of 13.1.  Rules out for sepsis.  CT with findings concerning for emphysematous cystitis.  Last urine culture was a year ago 08/2022-Enterobacter cloacae, assistant to cefazolin and Zosyn, sensitive to cefepime and ciprofloxacin  - Started on IV ceftriaxone 2 g daily in ED -Waiting pharmacy recommendations for choice of antibiotics - N/s 75cc/hr x 12 hrs

## 2023-08-05 NOTE — Assessment & Plan Note (Signed)
Resume tadalafil

## 2023-08-05 NOTE — Assessment & Plan Note (Addendum)
Stable.  Reports compliance with multiple antihypertensive medications. -Resume hydralazine 100, clonidine 0.2 for now -Await med reconciliation

## 2023-08-05 NOTE — Assessment & Plan Note (Signed)
Cr-3.49, gradual uptrending creatinine over the past few months.  Follows with nephrology.

## 2023-08-05 NOTE — Assessment & Plan Note (Signed)
-   HgbA1c - SSI- S

## 2023-08-05 NOTE — ED Triage Notes (Signed)
Pt complains of nausea, vomiting, and abdominal pain. States she has vomited "very small amount". Was seen here Thursday and received blood transfusion. Pt is scheduled to come to [facility] tomorrow for another transfusion. Also complains of headache.

## 2023-08-05 NOTE — Assessment & Plan Note (Addendum)
INR 8.  Hgb 7.7 today.  Anemia baseline 8-10.  Last checked hemoglobin was 6.9- 3 days ago status post transfusion of 1 unit PRBC by her hematologist.  Patient denies melena, hematemesis or hematochezia. -Oral vitamin K 5 mg x 1 -Trend INR daily -Hold Warfarin - Monitor hemoglobin closely with supratherapeutic INR

## 2023-08-05 NOTE — Assessment & Plan Note (Addendum)
On chronic anticoagulation with warfarin.  Currently with supratherapeutic INR- 8.

## 2023-08-05 NOTE — H&P (Addendum)
History and Physical    [name] UEA:[id number] DOB: Jan 17, 1941 DOA: 08/05/2023  PCP: Gaspar Garbe, MD   Patient coming from: Home  I have personally briefly reviewed patient's old medical records in [facility]  Chief Complaint: Nausea vomiting, abdominal pain  HPI: [name] is a 82 y.o. female with medical history significant for multiple medical condition including emphysematous cystitis, CKD 4, diastolic CHF, AIHA, mechanical aortic valve, hypertension and pulmonary hypertension, atrial fibrillation, GI bleed, peripheral artery disease, diabetes mellitus. Patient presented to the ED with complaints of nausea vomiting and lower abdominal pain.  Reports 1 episode of vomiting today.  No blood in vomitus, she reports a spot of blood in her urine, but no blood in stools.  Reports last week for 2 days her dose of warfarin was doubled, her INR was low at 1.4, it improved to 2.9 but dose was still adjusted.  ED Course:.  Tmax 98.  Heart rate 72-92.  Respirate rate 16-27.  Blood pressure systolic 130s to 478G.  O2 sats greater than 92% on room air. WBC 13.1. INR elevated at 80. UA with small leukocytes many bacteria. CT abdomen and pelvis Wo contrast-large amount of gas in bladder-instrumentation or catheterization, gas and nondependent wall of bladder suggesting emphysematous cystitis. IV ceftriaxone 2 g given.  Review of Systems: As per HPI all other systems reviewed and negative.  Past Medical History:  Diagnosis Date   Aortic atherosclerosis (HCC) 01/23/2018   Atrial fibrillation (HCC)    Benign positional vertigo 10/06/2011   Carotid artery disease (HCC)    Chemotherapy induced neutropenia (HCC) 07/21/2019   Cholelithiasis 01/23/2018   Cholelithiasis 01/23/2018   Chronic anticoagulation    Colovesical fistula, ruled out 04/14/2022   Coronary artery disease    status post coronary artery bypass grafting times 07/10/2004   Diabetes mellitus     Hypercholesteremia    Hypertension    Mechanical heart valve present    H. aortic valve replacement at the time of bypass surgery October 2005   Moderate to severe pulmonary hypertension (HCC)    Peripheral arterial disease (HCC)    history of left common iliac artery PTA and stenting for a chronic total occlusion 08/26/01   S/P cholecystectomy 03/06/2018    Past Surgical History:  Procedure Laterality Date   anterior repair  2009   AORTIC VALVE REPLACEMENT  2005   Dr. Laneta Simmers   CARDIAC CATHETERIZATION  11/10/2004   40% right common illiac, 70% in stent restenosis of distal left common illiac,    CARDIAC CATHETERIZATION  05/18/2004   LAD 50-70% midstenosis, RCA dominant w/50% stenosis, 50% Right common Illiac artery ostial stenosis, 90% in stent restenosis within midportion of left common illiac stent   Carotid Duplex  03/12/2012   RSA-elev. velocities suggestive of a 50-69% diameter reduction, Right&Left Bulb/Prox ICA-mild-mod.fibrous plaqueelevating Velocities abnormal study.   CHOLECYSTECTOMY N/A 03/01/2018   Procedure: LAPAROSCOPIC CHOLECYSTECTOMY;  Surgeon: Jimmye Norman, MD;  Location: [facility] OR;  Service: General;  Laterality: N/A;   COLONOSCOPY WITH PROPOFOL N/A 01/22/2018   Procedure: COLONOSCOPY WITH PROPOFOL;  Surgeon: Charlott Rakes, MD;  Location: [facility] ENDOSCOPY;  Service: Endoscopy;  Laterality: N/A;   ESOPHAGOGASTRODUODENOSCOPY N/A 07/13/2022   Procedure: ESOPHAGOGASTRODUODENOSCOPY (EGD);  Surgeon: Kerin Salen, MD;  Location: [facility] ENDOSCOPY;  Service: Gastroenterology;  Laterality: N/A;   ESOPHAGOGASTRODUODENOSCOPY (EGD) WITH PROPOFOL N/A 01/22/2018   Procedure: ESOPHAGOGASTRODUODENOSCOPY (EGD) WITH PROPOFOL;  Surgeon: Charlott Rakes, MD;  Location: [facility] ENDOSCOPY;  Service: Endoscopy;  Laterality: N/A;  ESOPHAGOGASTRODUODENOSCOPY (EGD) WITH PROPOFOL N/A 11/21/2018   Procedure: ESOPHAGOGASTRODUODENOSCOPY (EGD) WITH PROPOFOL;  Surgeon: Kerin Salen, MD;  Location: [facility] ENDOSCOPY;   Service: Gastroenterology;  Laterality: N/A;   ESOPHAGOGASTRODUODENOSCOPY (EGD) WITH PROPOFOL Left 02/27/2019   Procedure: ESOPHAGOGASTRODUODENOSCOPY (EGD) WITH PROPOFOL;  Surgeon: Willis Modena, MD;  Location: [facility] ENDOSCOPY;  Service: Endoscopy;  Laterality: Left;   GIVENS CAPSULE STUDY N/A 11/21/2018   Procedure: GIVENS CAPSULE STUDY;  Surgeon: Kerin Salen, MD;  Location: [facility] ENDOSCOPY;  Service: Gastroenterology;  Laterality: N/A;  To be deployed during EGD   Lower Ext. Duplex  03/12/2012   Right Proximal CIA- vessel narrowing w/elevated velocities 0-49% diameter reduction. Right SFA-mild mixed density plaque throughout vessel.   NM MYOCAR PERF WALL MOTION  05/19/2010   protocol: Persantine, post stress EF 65%, negative for ischemia, low risk scan   RIGHT HEART CATH N/A 06/27/2018   Procedure: RIGHT HEART CATH;  Surgeon: Laurey Morale, MD;  Location: [facility] INVASIVE CV LAB;  Service: Cardiovascular;  Laterality: N/A;   TOTAL ABDOMINAL HYSTERECTOMY W/ BILATERAL SALPINGOOPHORECTOMY  1989   TRANSTHORACIC ECHOCARDIOGRAM  08/29/2012   Moderately calcified annulus of mitral valve, moderate regurg. of both mitral valve and tricuspid valve.      reports that she has quit smoking. Her smoking use included cigarettes. She has a 30 pack-year smoking history. She has never used smokeless tobacco. She reports that she does not drink alcohol and does not use drugs.  Allergies  Allergen Reactions   Augmentin [Amoxicillin-Pot Clavulanate] Diarrhea and Nausea And Vomiting   Chlorthalidone Other (See Comments)   Ciprofloxacin Diarrhea, Nausea Only and Other (See Comments)    Stomach ache   Codeine     Insomnia, anxiety   Coreg [Carvedilol] Other (See Comments)    Terrible cramping in the feet and had a lot of bowel movements, but not diarrhea   Diflucan [Fluconazole] Diarrhea   Escitalopram     Other Reaction(s): made head feel funny   Levofloxacin In D5w     Vomiting, diarrhea, insomnia   Losartan  Swelling and Other (See Comments)    Patient doesn't recall site of swelling   Niacin     Other Reaction(s): made her feel hot all over   Privigen [Immune Globulin (Human)] Other (See Comments)    Severe/excruciating pain   Sertraline     Other Reaction(s): made head feel funny   Sulfamethoxazole-Trimethoprim Diarrhea, Nausea Only and Other (See Comments)    Stomach upset   Verapamil Hives   Zetia [Ezetimibe] Other (See Comments)    Reaction not recalled   Zocor [Simvastatin - High Dose] Other (See Comments)    Reaction not recalled   Gatifloxacin Rash and Other (See Comments)    Redness to skin around eye    Family History  Problem Relation Age of Onset   Heart disease Father    Hypertension Father    Hyperlipidemia Father    Breast cancer Neg Hx     Prior to Admission medications   Medication Sig Start Date End Date Taking? Authorizing Provider  acetaminophen (TYLENOL) 325 MG tablet Take 325 mg by mouth every 6 (six) hours as needed for mild pain or fever.    [provider]  allopurinol (ZYLOPRIM) 100 MG tablet Take 100 mg by mouth daily.    [provider]  ALPRAZolam Prudy Feeler) 0.5 MG tablet Take 0.5 mg by mouth daily.    [provider]  amLODipine (NORVASC) 10 MG tablet Take 1 tablet (10 mg total)  by mouth daily. 07/30/23   Alen Bleacher, NP  aspirin EC 81 MG tablet Take 81 mg by mouth daily. Swallow whole.    [provider]  atorvastatin (LIPITOR) 80 MG tablet Take 1 tablet (80 mg total) by mouth daily at 6 PM. 11/22/18   Hanley Ben, Kshitiz, MD  budesonide (PULMICORT) 0.5 MG/2ML nebulizer solution Take 2 mLs (0.5 mg total) by nebulization 2 (two) times daily. Patient taking differently: Take 0.5 mg by nebulization 2 (two) times daily. As needed 01/26/23   Meredeth Ide, MD  Cholecalciferol (VITAMIN D3) 50 MCG (2000 UT) capsule Take 2,000 Units by mouth daily with lunch. 11/26/18   [provider]  cloNIDine (CATAPRES) 0.2 MG tablet Take  0.2 mg by mouth 2 (two) times daily.    [provider]  Cyanocobalamin (VITAMIN B12) 1000 MCG TBCR Take 1,000 mcg by mouth daily with lunch.    [provider]  famotidine (PEPCID) 20 MG tablet Take 20 mg by mouth 2 (two) times daily.    [provider]  ferrous sulfate 325 (65 FE) MG tablet Take 1 tablet (325 mg total) by mouth daily with breakfast. Patient taking differently: Patient takes 2 tablets by mouth daily. 09/06/22   Johnson, Clanford L, MD  furosemide (LASIX) 40 MG tablet Patient take 1.5 (60 mg) tablet by mouth in the morning and 1.5 (60 mg) tablet at night. 07/31/23   Alen Bleacher, NP  hydrALAZINE (APRESOLINE) 100 MG tablet Take 1 tablet (100 mg total) by mouth 3 (three) times daily. 12/31/22   Shon Hale, MD  hydrocortisone (ANUSOL-HC) 2.5 % rectal cream Place 1 Application rectally 2 (two) times daily. For 14 days. May repeat if needed. 05/22/23   Tiffany Kocher, PA-C  insulin aspart (NOVOLOG) 100 UNIT/ML injection Inject 2-8 Units into the skin 3 (three) times daily with meals. Per sliding scale, If blood sugar is 200 to 250, give 2 units. If blood sugar is 251 to 300, give 4 units. If blood sugar is 301 to 350, give 6 units. If blood sugar is greater than 350, give 8 units and call MD. 09/05/22   Cleora Fleet, MD  Insulin Syringe-Needle U-100 (B-D INS SYR ULTRAFINE .3CC/29G) 29G X 1/2" 0.3 ML MISC Use for sliding scale for breakthrough high blood sugar readings 02/24/22   Iruku, Burnice Logan, MD  ipratropium-albuterol (DUONEB) 0.5-2.5 (3) MG/3ML SOLN Take 3 mLs by nebulization every 6 (six) hours as needed. 01/26/23   Meredeth Ide, MD  ondansetron (ZOFRAN-ODT) 4 MG disintegrating tablet Take 4 mg by mouth every 8 (eight) hours as needed for nausea or vomiting. 01/12/22   [provider]  pantoprazole (PROTONIX) 40 MG tablet Take 1 tablet (40 mg total) by mouth 2 (two) times daily. 12/31/22   Shon Hale, MD  senna-docusate (SENOKOT-S)  8.6-50 MG tablet Take 2 tablets by mouth at bedtime. Patient taking differently: Take 2 tablets by mouth at bedtime. As needed 12/31/22   Shon Hale, MD  sertraline (ZOLOFT) 25 MG tablet Take 25 mg by mouth daily.    [provider]  simethicone (GAS-X) 80 MG chewable tablet Chew 1 tablet (80 mg total) by mouth 4 (four) times daily as needed for flatulence. 04/16/22 06/25/24  Alberteen Sam, MD  spironolactone (ALDACTONE) 25 MG tablet TAKE 1 TABLET EVERY DAY 05/14/23   Laurey Morale, MD  tadalafil, PAH, (ADCIRCA) 20 MG tablet TAKE 2 TABLETS BY MOUTH ONCE DAILY 12/11/22   Laurey Morale, MD  warfarin (COUMADIN) 5 MG tablet Take 1 tablet (5 mg total) by mouth See admin instructions. Take 1 tablet 5mg  on Tuesday/Thursday/Sunday and 1/2 Tab (2.5 mg) on MWFSat Patient taking differently: Take 5 mg by mouth See admin instructions. 12/31/22   Shon Hale, MD    Physical Exam: Vitals:   08/05/23 1800 08/05/23 1930 08/05/23 2000 08/05/23 2030  BP: (!) 165/40 (!) 142/51 (!) 138/52 (!) 143/36  Pulse: 87  90 91  Resp: 14 19 16 18   Temp:      TempSrc:      SpO2: 96%  94% 92%  Weight:      Height:        Constitutional: NAD, calm, comfortable Vitals:   08/05/23 1800 08/05/23 1930 08/05/23 2000 08/05/23 2030  BP: (!) 165/40 (!) 142/51 (!) 138/52 (!) 143/36  Pulse: 87  90 91  Resp: 14 19 16 18   Temp:      TempSrc:      SpO2: 96%  94% 92%  Weight:      Height:       Eyes: PERRL, lids and conjunctivae normal ENMT: Mucous membranes are markedly dry.  Neck: normal, supple, no masses, no thyromegaly Respiratory: clear to auscultation bilaterally, no wheezing, no crackles. Normal respiratory effort. No accessory muscle use.  Cardiovascular: Regular rate and rhythm, no murmurs / rubs / gallops. No extremity edema. 2+ Extremities warm  Abdomen: moderate lower abdominal tenderness, with mild guarding, no masses palpated. No hepatosplenomegaly.  Musculoskeletal: no clubbing /  cyanosis. No joint deformity upper and lower extremities. Good ROM, no contractures. Normal muscle tone.  Skin: Chronic ecchymotic areas to bilateral upper extremities, no ulcers. No induration Neurologic: 4+/5 strength to bilateral upper and lower extremities, fluent, no facial asymmetry, Psychiatric: Normal judgment and insight. Alert and oriented x 3. Normal mood.   Labs on Admission: I have personally reviewed following labs and imaging studies  CBC: Recent Labs  Lab 07/30/23 1512 08/02/23 1128 08/05/23 1649  WBC 6.0 6.1 13.1*  NEUTROABS  --  4.1 10.7*  HGB 8.5* 6.9* 7.7*  HCT 27.4* 21.5* 23.7*  MCV 95.8 96.0 97.5  PLT 96* 101* 120*   Basic Metabolic Panel: Recent Labs  Lab 07/30/23 1512 08/02/23 1128 08/05/23 1649  NA 135 133* 132*  K 4.8 5.5* 4.6  CL 102 102 103  CO2 19* 21* 18*  GLUCOSE 184* 213* 161*  BUN 128* 132* 117*  CREATININE 3.23* 3.37* 3.49*  CALCIUM 8.7* 8.7* 8.7*   GFR: Estimated Creatinine Clearance: 7.7 mL/min (A) (by C-G formula based on SCr of 3.49 mg/dL (H)). Liver Function Tests: Recent Labs  Lab 08/02/23 1128 08/05/23 1649  AST 22 32  ALT 12 16  ALKPHOS 82 97  BILITOT 0.4 0.7  PROT 5.8* 5.8*  ALBUMIN 3.6 3.3*   Coagulation Profile: Recent Labs  Lab 07/30/23 1512 08/05/23 1649  INR 2.9* 8.0*   Urine analysis:    Component Value Date/Time   COLORURINE YELLOW 08/05/2023 1859   APPEARANCEUR HAZY (A) 08/05/2023 1859   LABSPEC 1.012 08/05/2023 1859   PHURINE 5.0 08/05/2023 1859   GLUCOSEU NEGATIVE 08/05/2023 1859   HGBUR MODERATE (A) 08/05/2023 1859   BILIRUBINUR NEGATIVE 08/05/2023 1859   BILIRUBINUR Negative 09/07/2021 1054   KETONESUR NEGATIVE 08/05/2023 1859   PROTEINUR 100 (A) 08/05/2023 1859   UROBILINOGEN 0.2 09/07/2021 1054   UROBILINOGEN 1.0 10/06/2011 1709   NITRITE NEGATIVE 08/05/2023 1859   LEUKOCYTESUR SMALL (A) 08/05/2023 1859    Radiological Exams on  Admission: CT ABDOMEN PELVIS WO CONTRAST  Result Date:  08/05/2023 CLINICAL DATA:  Nausea, vomiting and abdominal pain. EXAM: CT ABDOMEN AND PELVIS WITHOUT CONTRAST TECHNIQUE: Multidetector CT imaging of the abdomen and pelvis was performed following the standard protocol without IV contrast. RADIATION DOSE REDUCTION: This exam was performed according to the departmental dose-optimization program which includes automated exposure control, adjustment of the mA and/or kV according to patient size and/or use of iterative reconstruction technique. COMPARISON:  CT scan 01/13/2023 FINDINGS: Lower chest: Stable mild cardiac enlargement and advanced vascular calcifications. No pericardial effusion. Small hiatal hernia. No acute pulmonary findings. Stable basilar scarring changes and large calcified granuloma in the lingula. Hepatobiliary: No hepatic lesions are identified without contrast. The gallbladder is surgically absent. No biliary dilatation. Pancreas: Stable moderate pancreatic atrophy but no mass or inflammation. Spleen: Normal size. Scattered calcified splenic granulomas and advanced splenic artery calcifications. Adrenals/Urinary Tract: The adrenal glands are unremarkable. Extensive renal vascular calcifications. No renal mass lesions or hydronephrosis. There is a large amount of gas in the bladder possibly related to recent instrumentation or catheterization. There is also gas in the non dependent wall of the bladder suggesting emphysematous cystitis. Recommend correlation with urinalysis. Stomach/Bowel: Severe sigmoid colon diverticulosis but no findings for acute diverticulitis. No findings for bowel obstruction or free air. Duodenal diverticuli noted. Vascular/Lymphatic: Severe vascular disease appears stable. No aneurysm. No adenopathy. Reproductive: Surgically absent. Other: Pelvic floor relaxation findings with rectovaginocystocele noted. Musculoskeletal: Stable degenerative changes involving the spine and hips. Remote L4 compression fracture. IMPRESSION: 1.  Large amount of gas in the bladder possibly related to recent instrumentation or catheterization. There is also gas in the non dependent wall of the bladder suggesting emphysematous cystitis. Recommend correlation with urinalysis. 2. Severe sigmoid colon diverticulosis but no findings for acute diverticulitis. 3. No findings for bowel obstruction or free air. 4. Pelvic floor relaxation findings with rectovaginocystocele noted. 5. Severe vascular disease. Emphysema (ICD10-J43.9). Electronically Signed   By: Rudie Meyer M.D.   On: 08/05/2023 17:49    EKG: Independently reviewed.  Sinus, rate 91, QTc 431.  Previous EKG shows marked sinus bradycardia - rate 41, otherwise no significant ST or T wave changes from prior.  Assessment/Plan Principal Problem:   Emphysematous cystitis Active Problems:   Supratherapeutic INR   Chronic diastolic CHF (congestive heart failure) (HCC)   PAF (paroxysmal atrial fibrillation) (HCC)   CKD (chronic kidney disease), stage IV (HCC)   Essential hypertension   Mechanical heart valve present   Diabetes mellitus without complication (HCC)   Moderate to severe pulmonary hypertension (HCC)  Assessment and Plan: * Emphysematous cystitis Presenting with nausea vomiting and abdominal pain.  Leukocytosis of 13.1.  Rules out for sepsis.  CT with findings concerning for emphysematous cystitis.  Last urine culture was a year ago 08/2022-Enterobacter cloacae, assistant to cefazolin and Zosyn, sensitive to cefepime and ciprofloxacin  - Started on IV ceftriaxone 2 g daily in ED -Waiting pharmacy recommendations for choice of antibiotics - N/s 75cc/hr x 12 hrs  Supratherapeutic INR INR 8.  Hgb 7.7 today.  Anemia baseline 8-10.  Last checked hemoglobin was 6.9- 3 days ago status post transfusion of 1 unit PRBC by her hematologist.  Patient denies melena, hematemesis or hematochezia. -Oral vitamin K 5 mg x 1 -Trend INR daily -Hold Warfarin - Monitor hemoglobin closely with  supratherapeutic INR  CKD (chronic kidney disease), stage IV (HCC) Cr-3.49, gradual uptrending creatinine over the past few months.  Follows with nephrology.  PAF (paroxysmal  atrial fibrillation) (HCC) Rate controlled on anticoagulation with warfarin.  Not on rate limiting drugs.  Chronic diastolic CHF (congestive heart failure) (HCC) Stable and compensated.  Last echo 02/2023 EF of 65 to 70%, estimated RV systolic pressure of 52. -Hold Lasix 40 mg daily , hydrate gently, appears dehydrated.  Moderate to severe pulmonary hypertension (HCC) Resume tadalafil  Diabetes mellitus without complication (HCC) - HgbA1c - SSI- S  Mechanical heart valve present On chronic anticoagulation with warfarin.  Currently with supratherapeutic INR- 8.  Essential hypertension Stable.  Reports compliance with multiple antihypertensive medications. -Resume hydralazine 100, clonidine 0.2 for now -Await med reconciliation   DVT prophylaxis: SCDS- supratherapeutic INR Code Status: DNR, confirmed with patient at bedside.  ACP documents reviewed consistent with patient's DNR status. Family Communication: None at bedside Disposition Plan: > 2 days Consults called: None Admission status: Inpt Tele I certify that at the point of admission it is my clinical judgment that the patient will require inpatient hospital care spanning beyond 2 midnights from the point of admission due to high intensity of service, high risk for further deterioration and high frequency of surveillance required.    Author: Onnie Boer, MD 08/05/2023 11:01 PM  For on call review [email or web address].

## 2023-08-05 NOTE — Assessment & Plan Note (Addendum)
Stable and compensated.  Last echo 02/2023 EF of 65 to 70%, estimated RV systolic pressure of 52. -Hold Lasix 40 mg daily , hydrate gently, appears dehydrated.

## 2023-08-05 NOTE — ED Provider Notes (Signed)
[place] EMERGENCY DEPARTMENT AT [facility] Provider Note  CSN: [id number] Arrival date & time: 08/05/23 1614  Chief Complaint(s) Nausea, Emesis, and Abdominal Pain  HPI [name] is a 82 y.o. female here today with abdominal pain.  Patient with multiple complex medical problems, currently on warfarin for mechanical heart valve and atrial fibrillation.  She started experiencing lower abdominal pain on Friday.  She has had a few episodes of vomiting.  She has not been passing gas.   Past Medical History Past Medical History:  Diagnosis Date   Aortic atherosclerosis (HCC) 01/23/2018   Atrial fibrillation (HCC)    Benign positional vertigo 10/06/2011   Carotid artery disease (HCC)    Chemotherapy induced neutropenia (HCC) 07/21/2019   Cholelithiasis 01/23/2018   Cholelithiasis 01/23/2018   Chronic anticoagulation    Colovesical fistula, ruled out 04/14/2022   Coronary artery disease    status post coronary artery bypass grafting times 07/10/2004   Diabetes mellitus    Hypercholesteremia    Hypertension    Mechanical heart valve present    H. aortic valve replacement at the time of bypass surgery October 2005   Moderate to severe pulmonary hypertension (HCC)    Peripheral arterial disease (HCC)    history of left common iliac artery PTA and stenting for a chronic total occlusion 08/26/01   S/P cholecystectomy 03/06/2018   Patient Active Problem List   Diagnosis Date Noted   Chronic cough 03/26/2023   Protein-calorie malnutrition, severe 12/26/2022   Acute GI bleeding 12/24/2022   Coagulopathy (HCC) 12/24/2022   Melena 12/24/2022   Duodenitis 12/24/2022   Noninfectious gastroenteritis 12/24/2022   CAD, multiple vessel 12/24/2022   Acute on chronic heart failure with preserved ejection fraction (HFpEF) (HCC) 08/29/2022   Acute exacerbation of CHF (congestive heart failure) (HCC) 08/29/2022   Acute urinary retention 08/28/2022   Acute kidney injury  superimposed on chronic kidney disease (HCC) 08/27/2022   Constipation 08/27/2022   Nausea and vomiting 08/27/2022   Hypoalbuminemia due to protein-calorie malnutrition (HCC) 08/27/2022   Type 2 diabetes mellitus with hyperglycemia (HCC) 08/27/2022   Palliative care encounter    Goals of care, counseling/discussion    Encounter for therapeutic drug monitoring    GI bleed 07/09/2022   GI bleeding 07/08/2022   Acute on chronic anemia 07/08/2022   History of Clostridioides difficile colitis 07/08/2022   DNR (do not resuscitate)/DNI(Do Not Intubate) 07/08/2022   Anemia of chronic disease 07/07/2022   Pressure injury of skin 06/23/2022   UTI (urinary tract infection) 06/22/2022   Joint pain 06/09/2022   Parietoalveolar pneumopathy (HCC) 06/09/2022   Primary osteoarthritis 06/09/2022   Rheumatoid factor positive 06/09/2022   C. difficile colitis 04/19/2022   Colitis 04/18/2022   PAH (pulmonary artery hypertension) (HCC) 04/18/2022   Emphysematous cystitis 04/13/2022   Female bladder prolapse 04/13/2022   Diverticulitis 04/12/2022   Autoimmune hemolytic anemia (HCC) 01/02/2022   Bilateral lower extremity edema 01/02/2022   Refractory anemia (HCC) 12/15/2021   Abnormal chest x-ray 12/15/2021   Supratherapeutic INR 12/15/2021   Elevated troponin 12/15/2021   Unilateral primary osteoarthritis, right hip 05/31/2021   Midline cystocele 05/30/2021   Primary localized osteoarthritis of pelvic region and thigh 05/30/2021   Vaginal pain 05/30/2021   Asymmetric SNHL (sensorineural hearing loss) 01/06/2021   Referred otalgia of both ears 01/06/2021   Bilateral hearing loss 08/31/2020   Low back pain 02/20/2020   CKD (chronic kidney disease), stage IV (HCC) 10/13/2019   Leukopenia due to antineoplastic  chemotherapy (HCC) 08/18/2019   Thrombophilia (HCC) 06/19/2019   Encounter for general adult medical examination without abnormal findings 06/13/2019   Arteritis (HCC) 06/06/2019    Hypertensive heart and chronic kidney disease with heart failure and stage 1 through stage 4 chronic kidney disease, or unspecified chronic kidney disease (HCC) 06/06/2019   Muscle weakness 06/06/2019   Pancytopenia (HCC) 05/28/2019   Dyspnea 05/28/2019   Vasculitis, ANCA positive 05/28/2019   Disorder of connective tissue (HCC) 05/14/2019   Renal insufficiency 05/08/2019   PAF (paroxysmal atrial fibrillation) (HCC) 04/07/2019   IDA (iron deficiency anemia) 02/28/2019   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 (HCC) 02/28/2019   Diabetic retinopathy associated with type 2 diabetes mellitus (HCC) 09/02/2018   Chronic pain 06/19/2018   Non-thrombocytopenic purpura (HCC) 05/21/2018   Gout 03/22/2018   Malnutrition of moderate degree 03/12/2018   Diabetes mellitus type 2, controlled (HCC) 03/06/2018   Chronic atrial fibrillation (HCC) 03/06/2018   H/O mechanical aortic valve replacement 03/06/2018   Moderate to severe pulmonary hypertension (HCC) 03/06/2018   GERD without esophagitis 02/21/2018   Anxiety 02/21/2018   Chronic diastolic CHF (congestive heart failure) (HCC) 02/21/2018   General weakness 02/02/2018   Thrombocytopenia (HCC) 01/25/2018   Arm pain, diffuse 01/25/2018   Anemia 01/23/2018   Aortic atherosclerosis (HCC) 01/23/2018   Age-related osteoporosis without current pathological fracture 05/24/2017   Carotid artery occlusion 05/24/2017   Generalized anxiety disorder 05/24/2017   Hearing loss 05/24/2017   Presence of prosthetic heart valve 05/24/2017   Carotid artery disease (HCC) 08/18/2013   Peripheral arterial disease (HCC) 08/18/2013   Diabetes mellitus without complication (HCC) 08/18/2013   Essential hypertension    Hypercholesteremia    Mechanical heart valve present    Chronic anticoagulation    Home Medication(s) Prior to Admission medications   Medication Sig Start Date End Date Taking? Authorizing Provider  acetaminophen (TYLENOL)  325 MG tablet Take 325 mg by mouth every 6 (six) hours as needed for mild pain or fever.    [provider]  allopurinol (ZYLOPRIM) 100 MG tablet Take 100 mg by mouth daily.    [provider]  ALPRAZolam Prudy Feeler) 0.5 MG tablet Take 0.5 mg by mouth daily.    [provider]  amLODipine (NORVASC) 10 MG tablet Take 1 tablet (10 mg total) by mouth daily. 07/30/23   Alen Bleacher, NP  aspirin EC 81 MG tablet Take 81 mg by mouth daily. Swallow whole.    [provider]  atorvastatin (LIPITOR) 80 MG tablet Take 1 tablet (80 mg total) by mouth daily at 6 PM. 11/22/18   Hanley Ben, Kshitiz, MD  budesonide (PULMICORT) 0.5 MG/2ML nebulizer solution Take 2 mLs (0.5 mg total) by nebulization 2 (two) times daily. Patient taking differently: Take 0.5 mg by nebulization 2 (two) times daily. As needed 01/26/23   Meredeth Ide, MD  Cholecalciferol (VITAMIN D3) 50 MCG (2000 UT) capsule Take 2,000 Units by mouth daily with lunch. 11/26/18   [provider]  cloNIDine (CATAPRES) 0.2 MG tablet Take 0.2 mg by mouth 2 (two) times daily.    [provider]  Cyanocobalamin (VITAMIN B12) 1000 MCG TBCR Take 1,000 mcg by mouth daily with lunch.    [provider]  famotidine (PEPCID) 20 MG tablet Take 20 mg by mouth 2 (two) times daily.    [provider]  ferrous sulfate 325 (65 FE) MG tablet Take 1 tablet (325 mg total) by mouth daily with breakfast. Patient taking differently:  Patient takes 2 tablets by mouth daily. 09/06/22   Johnson, Clanford L, MD  furosemide (LASIX) 40 MG tablet Patient take 1.5 (60 mg) tablet by mouth in the morning and 1.5 (60 mg) tablet at night. 07/31/23   Alen Bleacher, NP  hydrALAZINE (APRESOLINE) 100 MG tablet Take 1 tablet (100 mg total) by mouth 3 (three) times daily. 12/31/22   Shon Hale, MD  hydrocortisone (ANUSOL-HC) 2.5 % rectal cream Place 1 Application rectally 2 (two) times daily. For 14 days. May repeat if needed. 05/22/23    Tiffany Kocher, PA-C  insulin aspart (NOVOLOG) 100 UNIT/ML injection Inject 2-8 Units into the skin 3 (three) times daily with meals. Per sliding scale, If blood sugar is 200 to 250, give 2 units. If blood sugar is 251 to 300, give 4 units. If blood sugar is 301 to 350, give 6 units. If blood sugar is greater than 350, give 8 units and call MD. 09/05/22   Cleora Fleet, MD  Insulin Syringe-Needle U-100 (B-D INS SYR ULTRAFINE .3CC/29G) 29G X 1/2" 0.3 ML MISC Use for sliding scale for breakthrough high blood sugar readings 02/24/22   Iruku, Burnice Logan, MD  ipratropium-albuterol (DUONEB) 0.5-2.5 (3) MG/3ML SOLN Take 3 mLs by nebulization every 6 (six) hours as needed. 01/26/23   Meredeth Ide, MD  ondansetron (ZOFRAN-ODT) 4 MG disintegrating tablet Take 4 mg by mouth every 8 (eight) hours as needed for nausea or vomiting. 01/12/22   [provider]  pantoprazole (PROTONIX) 40 MG tablet Take 1 tablet (40 mg total) by mouth 2 (two) times daily. 12/31/22   Shon Hale, MD  senna-docusate (SENOKOT-S) 8.6-50 MG tablet Take 2 tablets by mouth at bedtime. Patient taking differently: Take 2 tablets by mouth at bedtime. As needed 12/31/22   Shon Hale, MD  sertraline (ZOLOFT) 25 MG tablet Take 25 mg by mouth daily.    [provider]  simethicone (GAS-X) 80 MG chewable tablet Chew 1 tablet (80 mg total) by mouth 4 (four) times daily as needed for flatulence. 04/16/22 06/25/24  Alberteen Sam, MD  spironolactone (ALDACTONE) 25 MG tablet TAKE 1 TABLET EVERY DAY 05/14/23   Laurey Morale, MD  tadalafil, PAH, (ADCIRCA) 20 MG tablet TAKE 2 TABLETS BY MOUTH ONCE DAILY 12/11/22   Laurey Morale, MD  warfarin (COUMADIN) 5 MG tablet Take 1 tablet (5 mg total) by mouth See admin instructions. Take 1 tablet 5mg  on Tuesday/Thursday/Sunday and 1/2 Tab (2.5 mg) on MWFSat Patient taking differently: Take 5 mg by mouth See admin instructions. 12/31/22   Shon Hale, MD                                                                                                                                     Past Surgical History Past Surgical History:  Procedure Laterality Date   anterior repair  2009   AORTIC VALVE REPLACEMENT  2005  Dr. Laneta Simmers   CARDIAC CATHETERIZATION  11/10/2004   40% right common illiac, 70% in stent restenosis of distal left common illiac,    CARDIAC CATHETERIZATION  05/18/2004   LAD 50-70% midstenosis, RCA dominant w/50% stenosis, 50% Right common Illiac artery ostial stenosis, 90% in stent restenosis within midportion of left common illiac stent   Carotid Duplex  03/12/2012   RSA-elev. velocities suggestive of a 50-69% diameter reduction, Right&Left Bulb/Prox ICA-mild-mod.fibrous plaqueelevating Velocities abnormal study.   CHOLECYSTECTOMY N/A 03/01/2018   Procedure: LAPAROSCOPIC CHOLECYSTECTOMY;  Surgeon: Jimmye Norman, MD;  Location: [facility] OR;  Service: General;  Laterality: N/A;   COLONOSCOPY WITH PROPOFOL N/A 01/22/2018   Procedure: COLONOSCOPY WITH PROPOFOL;  Surgeon: Charlott Rakes, MD;  Location: [facility] ENDOSCOPY;  Service: Endoscopy;  Laterality: N/A;   ESOPHAGOGASTRODUODENOSCOPY N/A 07/13/2022   Procedure: ESOPHAGOGASTRODUODENOSCOPY (EGD);  Surgeon: Kerin Salen, MD;  Location: [facility] ENDOSCOPY;  Service: Gastroenterology;  Laterality: N/A;   ESOPHAGOGASTRODUODENOSCOPY (EGD) WITH PROPOFOL N/A 01/22/2018   Procedure: ESOPHAGOGASTRODUODENOSCOPY (EGD) WITH PROPOFOL;  Surgeon: Charlott Rakes, MD;  Location: [facility] ENDOSCOPY;  Service: Endoscopy;  Laterality: N/A;   ESOPHAGOGASTRODUODENOSCOPY (EGD) WITH PROPOFOL N/A 11/21/2018   Procedure: ESOPHAGOGASTRODUODENOSCOPY (EGD) WITH PROPOFOL;  Surgeon: Kerin Salen, MD;  Location: [facility] ENDOSCOPY;  Service: Gastroenterology;  Laterality: N/A;   ESOPHAGOGASTRODUODENOSCOPY (EGD) WITH PROPOFOL Left 02/27/2019   Procedure: ESOPHAGOGASTRODUODENOSCOPY (EGD) WITH PROPOFOL;  Surgeon: Willis Modena, MD;  Location[facility] ENDOSCOPY;  Service:  Endoscopy;  Laterality: Left;   GIVENS CAPSULE STUDY N/A 11/21/2018   Procedure: GIVENS CAPSULE STUDY;  Surgeon: Kerin Salen, MD;  Location: [facility] ENDOSCOPY;  Service: Gastroenterology;  Laterality: N/A;  To be deployed during EGD   Lower Ext. Duplex  03/12/2012   Right Proximal CIA- vessel narrowing w/elevated velocities 0-49% diameter reduction. Right SFA-mild mixed density plaque throughout vessel.   NM MYOCAR PERF WALL MOTION  05/19/2010   protocol: Persantine, post stress EF 65%, negative for ischemia, low risk scan   RIGHT HEART CATH N/A 06/27/2018   Procedure: RIGHT HEART CATH;  Surgeon: Laurey Morale, MD;  Location: [facility] INVASIVE CV LAB;  Service: Cardiovascular;  Laterality: N/A;   TOTAL ABDOMINAL HYSTERECTOMY W/ BILATERAL SALPINGOOPHORECTOMY  1989   TRANSTHORACIC ECHOCARDIOGRAM  08/29/2012   Moderately calcified annulus of mitral valve, moderate regurg. of both mitral valve and tricuspid valve.    Family History Family History  Problem Relation Age of Onset   Heart disease Father    Hypertension Father    Hyperlipidemia Father    Breast cancer Neg Hx     Social History Social History   Tobacco Use   Smoking status: Former    Current packs/day: 1.00    Average packs/day: 1 pack/day for 30.0 years (30.0 ttl pk-yrs)    Types: Cigarettes   Smokeless tobacco: Never   Tobacco comments:    quit smoking 2005  Vaping Use   Vaping status: Never Used  Substance Use Topics   Alcohol use: No    Alcohol/week: 0.0 standard drinks of alcohol   Drug use: No   Allergies Augmentin [amoxicillin-pot clavulanate], Chlorthalidone, Ciprofloxacin, Codeine, Coreg [carvedilol], Diflucan [fluconazole], Escitalopram, Levofloxacin in d5w, Losartan, Niacin, Privigen [immune globulin (human)], Sertraline, Sulfamethoxazole-trimethoprim, Verapamil, Zetia [ezetimibe], Zocor [simvastatin - high dose], and Gatifloxacin  Review of Systems Review of Systems  Physical Exam Vital Signs  I have reviewed  the triage vital signs BP (!) 195/42 (BP Location: Left Arm)   Pulse 79   Temp 98 F (36.7 C) (Oral)   Resp 18  Ht 4\' 10"  (1.473 m)   Wt 48.9 kg   SpO2 100%   BMI 22.53 kg/m   Physical Exam Abdominal:     Palpations: Abdomen is soft.     Tenderness: There is generalized abdominal tenderness and tenderness in the suprapubic area. There is no guarding.     Hernia: A hernia is present. Hernia is present in the umbilical area.     ED Results and Treatments Labs (all labs ordered are listed, but only abnormal results are displayed) Labs Reviewed  CBC WITH DIFFERENTIAL/PLATELET  COMPREHENSIVE METABOLIC PANEL  URINALYSIS, W/ REFLEX TO CULTURE (INFECTION SUSPECTED)  PROTIME-INR                                                                                                                          Radiology No results found.  Pertinent labs & imaging results that were available during my care of the patient were reviewed by me and considered in my medical decision making (see MDM for details).  Medications Ordered in ED Medications  morphine (PF) 4 MG/ML injection 4 mg (has no administration in time range)  ondansetron (ZOFRAN) injection 4 mg (has no administration in time range)                                                                                                                                     Procedures Procedures  (including critical care time)  Medical Decision Making / ED Course   This patient presents to the ED for concern of abdominal pain, this involves an extensive number of treatment options, and is a complaint that carries with it a high risk of complications and morbidity.  The differential diagnosis includes bowel obstruction, intra-abdominal infection, cystitis, less likely AAA, less likely aortic dissection.    MDM: Plan-patient's abdomen soft, did feel a small hernia versus diastases rectus on the patient.  Was reduced.  Will obtain imaging  of the patient's abdomen pelvis.  Analgesia provided.  Suspicion is highest for bowel obstruction.  Reassessment 8:30 PM-patient with emphysematous cystitis.  Urinalysis consistent with this.  Patient's pain is much better controlled.  She has received Rocephin.  Patient's INR is 8.  Believe patient would benefit from admission for continued treatment.  Has a history of Enterobacter cystitis.   Additional history obtained: -Additional history obtained from patient's daughter -External records from outside source obtained and reviewed  including: Chart review including previous notes, labs, imaging, consultation notes   Lab Tests: -I ordered, reviewed, and interpreted labs.   The pertinent results include:   Labs Reviewed  CBC WITH DIFFERENTIAL/PLATELET  COMPREHENSIVE METABOLIC PANEL  URINALYSIS, W/ REFLEX TO CULTURE (INFECTION SUSPECTED)  PROTIME-INR      EKG my independent review the patient's EKG shows no ST segment  elevations, no T wave versions, no evidence of acute ischemia.  EKG Interpretation Date/Time:    Ventricular Rate:    PR Interval:    QRS Duration:    QT Interval:    QTC Calculation:   R Axis:      Text Interpretation:           Imaging Studies ordered: I ordered imaging studies including CT imaging the abdomen pelvis I independently visualized and interpreted imaging. I agree with the radiologist interpretation   Medicines ordered and prescription drug management: Meds ordered this encounter  Medications   morphine (PF) 4 MG/ML injection 4 mg   ondansetron (ZOFRAN) injection 4 mg    -I have reviewed the patients home medicines and have made adjustments as needed    Consultations Obtained: I requested consultation with the hospitalist,  and discussed lab and imaging findings as well as pertinent plan - they recommend: Admission   Cardiac Monitoring: The patient was maintained on a cardiac monitor.  I personally viewed and interpreted the  cardiac monitored which showed an underlying rhythm of: Normal sinus rhythm  Social Determinants of Health:  Factors impacting patients care include: Multiple medical comorbidities which are poorly controlled   Reevaluation: After the interventions noted above, I reevaluated the patient and found that they have :improved  Co morbidities that complicate the patient evaluation  Past Medical History:  Diagnosis Date   Aortic atherosclerosis (HCC) 01/23/2018   Atrial fibrillation (HCC)    Benign positional vertigo 10/06/2011   Carotid artery disease (HCC)    Chemotherapy induced neutropenia (HCC) 07/21/2019   Cholelithiasis 01/23/2018   Cholelithiasis 01/23/2018   Chronic anticoagulation    Colovesical fistula, ruled out 04/14/2022   Coronary artery disease    status post coronary artery bypass grafting times 07/10/2004   Diabetes mellitus    Hypercholesteremia    Hypertension    Mechanical heart valve present    H. aortic valve replacement at the time of bypass surgery October 2005   Moderate to severe pulmonary hypertension (HCC)    Peripheral arterial disease (HCC)    history of left common iliac artery PTA and stenting for a chronic total occlusion 08/26/01   S/P cholecystectomy 03/06/2018      Dispostion: Admission     Final Clinical Impression(s) / ED Diagnoses Final diagnoses:  None     @PCDICTATION @    Anders Simmonds T, DO 08/05/23 2038

## 2023-08-06 ENCOUNTER — Inpatient Hospital Stay: Payer: Medicare HMO

## 2023-08-06 ENCOUNTER — Encounter (HOSPITAL_COMMUNITY): Payer: Self-pay | Admitting: Internal Medicine

## 2023-08-06 DIAGNOSIS — N179 Acute kidney failure, unspecified: Secondary | ICD-10-CM

## 2023-08-06 DIAGNOSIS — N184 Chronic kidney disease, stage 4 (severe): Secondary | ICD-10-CM | POA: Diagnosis not present

## 2023-08-06 DIAGNOSIS — R791 Abnormal coagulation profile: Secondary | ICD-10-CM | POA: Diagnosis not present

## 2023-08-06 DIAGNOSIS — N3080 Other cystitis without hematuria: Secondary | ICD-10-CM | POA: Diagnosis not present

## 2023-08-06 DIAGNOSIS — I5032 Chronic diastolic (congestive) heart failure: Secondary | ICD-10-CM | POA: Diagnosis not present

## 2023-08-06 LAB — BASIC METABOLIC PANEL
Anion gap: 14 (ref 5–15)
BUN: 107 mg/dL — ABNORMAL HIGH (ref 8–23)
CO2: 15 mmol/L — ABNORMAL LOW (ref 22–32)
Calcium: 8.4 mg/dL — ABNORMAL LOW (ref 8.9–10.3)
Chloride: 107 mmol/L (ref 98–111)
Creatinine, Ser: 3.19 mg/dL — ABNORMAL HIGH (ref 0.44–1.00)
GFR, Estimated: 14 mL/min — ABNORMAL LOW (ref >60)
Glucose, Bld: 164 mg/dL — ABNORMAL HIGH (ref 70–99)
Potassium: 4.7 mmol/L (ref 3.5–5.1)
Sodium: 136 mmol/L (ref 135–145)

## 2023-08-06 LAB — GLUCOSE, CAPILLARY
Glucose-Capillary: 159 mg/dL — ABNORMAL HIGH (ref 70–99)
Glucose-Capillary: 150 mg/dL — ABNORMAL HIGH (ref 70–99)
Glucose-Capillary: 138 mg/dL — ABNORMAL HIGH (ref 70–99)
Glucose-Capillary: 166 mg/dL — ABNORMAL HIGH (ref 70–99)

## 2023-08-06 LAB — HEMOGLOBIN A1C
Hgb A1c MFr Bld: 6.2 % — ABNORMAL HIGH (ref 4.8–5.6)
Mean Plasma Glucose: 131.24 mg/dL

## 2023-08-06 LAB — CBC
HCT: 22.8 % — ABNORMAL LOW (ref 36.0–46.0)
Hemoglobin: 7.1 g/dL — ABNORMAL LOW (ref 12.0–15.0)
MCH: 30.7 pg (ref 26.0–34.0)
MCHC: 31.1 g/dL (ref 30.0–36.0)
MCV: 98.7 fL (ref 80.0–100.0)
Platelets: 124 10*3/uL — ABNORMAL LOW (ref 150–400)
RBC: 2.31 MIL/uL — ABNORMAL LOW (ref 3.87–5.11)
RDW: 16.4 % — ABNORMAL HIGH (ref 11.5–15.5)
WBC: 11.8 10*3/uL — ABNORMAL HIGH (ref 4.0–10.5)
nRBC: 0.2 % (ref 0.0–0.2)

## 2023-08-06 LAB — PROTIME-INR
INR: 5.8 (ref 0.8–1.2)
Prothrombin Time: 52.5 s — ABNORMAL HIGH (ref 11.4–15.2)

## 2023-08-06 MED ORDER — ONDANSETRON HCL 4 MG/2ML IJ SOLN
4.0000 mg | Freq: Four times a day (QID) | INTRAMUSCULAR | Status: DC | PRN
  Administered 2023-08-06 – 2023-08-07 (×4): 4 mg via INTRAVENOUS
  Filled 2023-08-06 (×4): qty 2

## 2023-08-06 MED ORDER — ALPRAZOLAM 0.25 MG PO TABS
0.2500 mg | ORAL_TABLET | Freq: Once | ORAL | Status: AC
  Administered 2023-08-06: 0.25 mg via ORAL
  Filled 2023-08-06: qty 1

## 2023-08-06 MED ORDER — PROCHLORPERAZINE EDISYLATE 10 MG/2ML IJ SOLN
10.0000 mg | Freq: Four times a day (QID) | INTRAMUSCULAR | Status: DC | PRN
  Administered 2023-08-06: 10 mg via INTRAVENOUS
  Filled 2023-08-06: qty 2

## 2023-08-06 MED ORDER — SODIUM CHLORIDE 0.9 % IV SOLN
2.0000 g | Freq: Once | INTRAVENOUS | Status: AC
  Administered 2023-08-06: 2 g via INTRAVENOUS
  Filled 2023-08-06: qty 12.5

## 2023-08-06 MED ORDER — INSULIN ASPART 100 UNIT/ML IJ SOLN: 0.0000 [IU] | Freq: Every day | INTRAMUSCULAR | Status: DC

## 2023-08-06 MED ORDER — POLYETHYLENE GLYCOL 3350 17 G PO PACK
17.0000 g | PACK | Freq: Every day | ORAL | Status: DC | PRN
Start: 2023-08-06 — End: 2023-08-09

## 2023-08-06 MED ORDER — INSULIN ASPART 100 UNIT/ML IJ SOLN: 0.0000 [IU] | Freq: Three times a day (TID) | INTRAMUSCULAR | Status: DC

## 2023-08-06 MED ORDER — HYDRALAZINE HCL 25 MG PO TABS
100.0000 mg | ORAL_TABLET | Freq: Three times a day (TID) | ORAL | Status: DC
  Administered 2023-08-06 – 2023-08-09 (×11): 100 mg via ORAL
  Filled 2023-08-06 (×11): qty 4

## 2023-08-06 MED ORDER — SODIUM CHLORIDE 0.9 % IV SOLN
1.0000 g | INTRAVENOUS | Status: DC
  Administered 2023-08-08 – 2023-08-09 (×2): 1 g via INTRAVENOUS
  Filled 2023-08-06 (×4): qty 10

## 2023-08-06 MED ORDER — CLONIDINE HCL 0.1 MG PO TABS
0.2000 mg | ORAL_TABLET | Freq: Two times a day (BID) | ORAL | Status: DC
  Administered 2023-08-06 – 2023-08-09 (×7): 0.2 mg via ORAL
  Filled 2023-08-06 (×7): qty 2

## 2023-08-06 MED ORDER — PHYTONADIONE 5 MG PO TABS
2.5000 mg | ORAL_TABLET | Freq: Once | ORAL | Status: AC
  Administered 2023-08-06: 2.5 mg via ORAL
  Filled 2023-08-06: qty 1

## 2023-08-06 MED ORDER — BUDESONIDE 0.5 MG/2ML IN SUSP
0.5000 mg | Freq: Two times a day (BID) | RESPIRATORY_TRACT | Status: DC
Start: 2023-08-06 — End: 2023-08-09
  Administered 2023-08-06 – 2023-08-09 (×6): 0.5 mg via RESPIRATORY_TRACT
  Filled 2023-08-06 (×8): qty 2

## 2023-08-06 MED ORDER — ACETAMINOPHEN 650 MG RE SUPP: 650.0000 mg | Freq: Four times a day (QID) | RECTAL | Status: DC | PRN

## 2023-08-06 MED ORDER — ACETAMINOPHEN 325 MG PO TABS
650.0000 mg | ORAL_TABLET | Freq: Four times a day (QID) | ORAL | Status: DC | PRN
  Administered 2023-08-06 – 2023-08-09 (×3): 650 mg via ORAL
  Filled 2023-08-06 (×3): qty 2

## 2023-08-06 MED ORDER — ORAL CARE MOUTH RINSE: 15.0000 mL | OROMUCOSAL | Status: DC | PRN

## 2023-08-06 MED ORDER — ONDANSETRON HCL 4 MG PO TABS
4.0000 mg | ORAL_TABLET | Freq: Four times a day (QID) | ORAL | Status: DC | PRN
  Administered 2023-08-08: 4 mg via ORAL
  Filled 2023-08-06: qty 1

## 2023-08-06 MED ORDER — IPRATROPIUM-ALBUTEROL 0.5-2.5 (3) MG/3ML IN SOLN: 3.0000 mL | Freq: Four times a day (QID) | RESPIRATORY_TRACT | Status: DC | PRN

## 2023-08-06 NOTE — Plan of Care (Signed)
  Problem: Nutrition: Goal: Adequate nutrition will be maintained Outcome: Progressing   Problem: Elimination: Goal: Will not experience complications related to bowel motility Outcome: Progressing Goal: Will not experience complications related to urinary retention Outcome: Progressing   Problem: Pain Management: Goal: General experience of comfort will improve Outcome: Progressing   Problem: Safety: Goal: Ability to remain free from injury will improve Outcome: Progressing   Problem: Skin Integrity: Goal: Risk for impaired skin integrity will decrease Outcome: Progressing

## 2023-08-06 NOTE — Progress Notes (Signed)
Pharmacy Antibiotic Note  [name] is a 82 y.o. female admitted on 08/05/2023 with emphysematous cystitis.  Pharmacy has been consulted for cefepime dosing.  Pt has h/o Enterobacter cloacae resistant to Zosyn and sensitive to cefepime.  If no improvement on cefepime would recommend FQ.  Plan: Cefepime 2g IV x1 followed by 1g IV Q24H.  Height: 4\' 10"  (147.3 cm) Weight: 48.9 kg (107 lb 12.9 oz) IBW/kg (Calculated) : 40.9  Temp (24hrs), Avg:98 F (36.7 C), Min:98 F (36.7 C), Max:98 F (36.7 C)  Recent Labs  Lab 07/30/23 1512 08/02/23 1128 08/05/23 1649  WBC 6.0 6.1 13.1*  CREATININE 3.23* 3.37* 3.49*    Estimated Creatinine Clearance: 7.7 mL/min (A) (by C-G formula based on SCr of 3.49 mg/dL (H)).    Allergies  Allergen Reactions   Augmentin [Amoxicillin-Pot Clavulanate] Diarrhea and Nausea And Vomiting   Chlorthalidone Other (See Comments)   Ciprofloxacin Diarrhea, Nausea Only and Other (See Comments)    Stomach ache   Codeine     Insomnia, anxiety   Coreg [Carvedilol] Other (See Comments)    Terrible cramping in the feet and had a lot of bowel movements, but not diarrhea   Diflucan [Fluconazole] Diarrhea   Escitalopram     Other Reaction(s): made head feel funny   Levofloxacin In D5w     Vomiting, diarrhea, insomnia   Losartan Swelling and Other (See Comments)    Patient doesn't recall site of swelling   Niacin     Other Reaction(s): made her feel hot all over   Privigen [Immune Globulin (Human)] Other (See Comments)    Severe/excruciating pain   Sertraline     Other Reaction(s): made head feel funny   Sulfamethoxazole-Trimethoprim Diarrhea, Nausea Only and Other (See Comments)    Stomach upset   Verapamil Hives   Zetia [Ezetimibe] Other (See Comments)    Reaction not recalled   Zocor [Simvastatin - High Dose] Other (See Comments)    Reaction not recalled   Gatifloxacin Rash and Other (See Comments)    Redness to skin around eye    Thank you for  allowing pharmacy to be a part of this patient's care.  Vernard Gambles, PharmD, BCPS  08/06/2023 1:11 AM

## 2023-08-06 NOTE — Progress Notes (Signed)
Date and time results received: 08/06/23 0719  Test: INR Critical Value: 5.8  Name of Provider Vassie Loll, MD paged  Orders Received? Or Actions Taken?:  No new orders

## 2023-08-06 NOTE — Progress Notes (Signed)
Pt stated she was scheduled to get a unit of blood transfused at the cancer center today. Hgb 7.1. Vassie Loll, MD aware. No new orders for blood transfusion. Pt made aware.

## 2023-08-06 NOTE — TOC Initial Note (Signed)
Transition of Care [facility]) - Initial/Assessment Note    Patient Details  Name: [name] MRN: [id number] Date of Birth: 06-09-1941  Transition of Care [facility]) CM/SW Contact:    Karn Cassis, LCSW Phone Number: 08/06/2023, 8:55 AM  Clinical Narrative:  Assessment completed due to high risk readmission score. Pt reports she lives with her daughter. She requires assist with bathing. Pt ambulates with walker or rollator. No home health services currently. Pt's children take her to appointments. Pt indicates she is active with outpatient palliative care but she is not sure what agency. She plans to return home when medically stable. TOC will follow.                  Expected Discharge Plan: Home/Self Care Barriers to Discharge: Continued Medical Work up   Patient Goals and CMS Choice Patient states their goals for this hospitalization and ongoing recovery are:: return home   Choice offered to / list presented to : Patient [place] ownership interest in [facility].provided to::  (n/a)    Expected Discharge Plan and Services In-house Referral: Clinical Social Work     Living arrangements for the past 2 months: Single Family Home                                      Prior Living Arrangements/Services Living arrangements for the past 2 months: Single Family Home Lives with:: Adult Children Patient language and need for interpreter reviewed:: Yes Do you feel safe going back to the place where you live?: Yes        Care giver support system in place?: Yes (comment) Current home services: DME (walker, rollator, BSC) Criminal Activity/Legal Involvement Pertinent to Current Situation/Hospitalization: No - Comment as needed  Activities of Daily Living   ADL Screening (condition at time of admission) Independently performs ADLs?: Yes (appropriate for developmental age) Is the patient deaf or have difficulty hearing?: Yes Does the patient have  difficulty seeing, even when wearing glasses/contacts?: No Does the patient have difficulty concentrating, remembering, or making decisions?: No  Permission Sought/Granted                  Emotional Assessment     Affect (typically observed): Appropriate Orientation: : Oriented to Self, Oriented to Place, Oriented to  Time, Oriented to Situation Alcohol / Substance Use: Not Applicable Psych Involvement: No (comment)  Admission diagnosis:  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.80] Patient Active Problem List   Diagnosis Date Noted   Chronic cough 03/26/2023   Protein-calorie malnutrition, severe 12/26/2022   Acute GI bleeding 12/24/2022   Coagulopathy (HCC) 12/24/2022   Melena 12/24/2022   Duodenitis 12/24/2022   Noninfectious gastroenteritis 12/24/2022   CAD, multiple vessel 12/24/2022   Acute on chronic heart failure with preserved ejection fraction (HFpEF) (HCC) 08/29/2022   Acute exacerbation of CHF (congestive heart failure) (HCC) 08/29/2022   Acute urinary retention 08/28/2022   Acute kidney injury superimposed on chronic kidney disease (HCC) 08/27/2022   Constipation 08/27/2022   Nausea and vomiting 08/27/2022   Hypoalbuminemia due to protein-calorie malnutrition (HCC) 08/27/2022   Type 2 diabetes mellitus with hyperglycemia (HCC) 08/27/2022   Palliative care encounter    Goals of care, counseling/discussion    Encounter for therapeutic drug monitoring    GI bleed 07/09/2022   GI bleeding 07/08/2022   Acute on chronic anemia 07/08/2022   History of Clostridioides difficile colitis  07/08/2022   DNR (do not resuscitate)/DNI(Do Not Intubate) 07/08/2022   Anemia of chronic disease 07/07/2022   Pressure injury of skin 06/23/2022   UTI (urinary tract infection) 06/22/2022   Joint pain 06/09/2022   Parietoalveolar pneumopathy (HCC) 06/09/2022   Primary osteoarthritis 06/09/2022   Rheumatoid factor positive 06/09/2022   C. difficile colitis 04/19/2022   Colitis 04/18/2022    PAH (pulmonary artery hypertension) (HCC) 04/18/2022   Emphysematous cystitis 04/13/2022   Female bladder prolapse 04/13/2022   Diverticulitis 04/12/2022   Autoimmune hemolytic anemia (HCC) 01/02/2022   Bilateral lower extremity edema 01/02/2022   Refractory anemia (HCC) 12/15/2021   Abnormal chest x-ray 12/15/2021   Supratherapeutic INR 12/15/2021   Elevated troponin 12/15/2021   Unilateral primary osteoarthritis, right hip 05/31/2021   Midline cystocele 05/30/2021   Primary localized osteoarthritis of pelvic region and thigh 05/30/2021   Vaginal pain 05/30/2021   Asymmetric SNHL (sensorineural hearing loss) 01/06/2021   Referred otalgia of both ears 01/06/2021   Bilateral hearing loss 08/31/2020   Low back pain 02/20/2020   CKD (chronic kidney disease), stage IV (HCC) 10/13/2019   Leukopenia due to antineoplastic chemotherapy (HCC) 08/18/2019   Thrombophilia (HCC) 06/19/2019   Encounter for general adult medical examination without abnormal findings 06/13/2019   Arteritis (HCC) 06/06/2019   Hypertensive heart and chronic kidney disease with heart failure and stage 1 through stage 4 chronic kidney disease, or unspecified chronic kidney disease (HCC) 06/06/2019   Muscle weakness 06/06/2019   Pancytopenia (HCC) 05/28/2019   Dyspnea 05/28/2019   Vasculitis, ANCA positive 05/28/2019   Disorder of connective tissue (HCC) 05/14/2019   Renal insufficiency 05/08/2019   PAF (paroxysmal atrial fibrillation) (HCC) 04/07/2019   IDA (iron deficiency anemia) 02/28/2019   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 (HCC) 02/28/2019   Diabetic retinopathy associated with type 2 diabetes mellitus (HCC) 09/02/2018   Chronic pain 06/19/2018   Non-thrombocytopenic purpura (HCC) 05/21/2018   Gout 03/22/2018   Malnutrition of moderate degree 03/12/2018   Diabetes mellitus type 2, controlled (HCC) 03/06/2018   Chronic atrial fibrillation (HCC) 03/06/2018   H/O mechanical aortic  valve replacement 03/06/2018   Moderate to severe pulmonary hypertension (HCC) 03/06/2018   GERD without esophagitis 02/21/2018   Anxiety 02/21/2018   Chronic diastolic CHF (congestive heart failure) (HCC) 02/21/2018   General weakness 02/02/2018   Thrombocytopenia (HCC) 01/25/2018   Arm pain, diffuse 01/25/2018   Anemia 01/23/2018   Aortic atherosclerosis (HCC) 01/23/2018   Age-related osteoporosis without current pathological fracture 05/24/2017   Carotid artery occlusion 05/24/2017   Generalized anxiety disorder 05/24/2017   Hearing loss 05/24/2017   Presence of prosthetic heart valve 05/24/2017   Carotid artery disease (HCC) 08/18/2013   Peripheral arterial disease (HCC) 08/18/2013   Diabetes mellitus without complication (HCC) 08/18/2013   Essential hypertension    Hypercholesteremia    Mechanical heart valve present    Chronic anticoagulation    PCP:  Gaspar Garbe, MD Pharmacy:   [facility] - [place], [place] - [address] [place] [address] Phone: [phone] Fax: [phone]  [id number] - [place], [place] - [address] AT [facility] [address] [place] [address] Phone: [phone] Fax: [phone]     Social Determinants of Health (SDOH) Social History: SDOH Screenings   Food Insecurity: Patient Declined (08/06/2023)  Housing: Patient Declined (08/06/2023)  Transportation Needs: Patient Declined (08/06/2023)  Utilities: Patient Declined (08/06/2023)  Alcohol Screen: Low Risk  (02/13/2022)  Depression (PHQ2-9): Low Risk  (07/20/2023)  Financial Resource Strain: Low Risk  (02/28/2022)  Physical Activity: Inactive (02/28/2022)  Social Connections: Moderately Integrated (06/22/2022)  Stress: No Stress Concern Present (02/28/2022)  Tobacco Use: Medium Risk (08/06/2023)   SDOH Interventions:     Readmission Risk Interventions    01/14/2023   12:20 PM 12/25/2022    9:36 AM 08/30/2022    11:42 AM  Readmission Risk Prevention Plan  Transportation Screening Complete Complete Complete  Medication Review Oceanographer) Complete Complete Complete  HRI or Home Care Consult Complete Complete Complete  SW Recovery Care/Counseling Consult Complete Complete Complete  Palliative Care Screening Not Applicable Not Applicable Not Applicable  Skilled Nursing Facility Not Applicable Not Applicable Not Applicable

## 2023-08-06 NOTE — ED Notes (Signed)
ED TO INPATIENT HANDOFF REPORT  ED Nurse Name and Phone #: Ephriam Knuckles Medic  S Name/Age/Gender [name] 82 y.o. female Room/Bed: APA11/APA11  Code Status   Code Status: Limited: Do not attempt resuscitation (DNR) -DNR-LIMITED -Do Not Intubate/DNI   Home/SNF/Other Home Patient oriented to: self, place, time, and situation Is this baseline? Yes   Triage Complete: Triage complete  Chief Complaint 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.80]  Triage Note Pt complains of nausea, vomiting, and abdominal pain. States she has vomited "very small amount". Was seen here Thursday and received blood transfusion. Pt is scheduled to come to [facility] tomorrow for another transfusion. Also complains of headache.   Allergies Allergies  Allergen Reactions   Augmentin [Amoxicillin-Pot Clavulanate] Diarrhea and Nausea And Vomiting   Chlorthalidone Other (See Comments)   Ciprofloxacin Diarrhea, Nausea Only and Other (See Comments)    Stomach ache   Codeine     Insomnia, anxiety   Coreg [Carvedilol] Other (See Comments)    Terrible cramping in the feet and had a lot of bowel movements, but not diarrhea   Diflucan [Fluconazole] Diarrhea   Escitalopram     Other Reaction(s): made head feel funny   Levofloxacin In D5w     Vomiting, diarrhea, insomnia   Losartan Swelling and Other (See Comments)    Patient doesn't recall site of swelling   Niacin     Other Reaction(s): made her feel hot all over   Privigen [Immune Globulin (Human)] Other (See Comments)    Severe/excruciating pain   Sertraline     Other Reaction(s): made head feel funny   Sulfamethoxazole-Trimethoprim Diarrhea, Nausea Only and Other (See Comments)    Stomach upset   Verapamil Hives   Zetia [Ezetimibe] Other (See Comments)    Reaction not recalled   Zocor [Simvastatin - High Dose] Other (See Comments)    Reaction not recalled   Gatifloxacin Rash and Other (See Comments)    Redness to skin around eye    Level of Care/Admitting  Diagnosis ED Disposition     ED Disposition  Admit   Condition  --   Comment  Hospital Area: [facility] [[id number]]  Level of Care: Telemetry [5]  Covid Evaluation: Asymptomatic - no recent exposure (last 10 days) testing not required  Diagnosis: Emphysematous cystitis [161096]  Admitting Physician: Cresenciano Lick  Attending Physician: Onnie Boer Xenia.Douglas  Certification:: I certify this patient will need inpatient services for at least 2 midnights  Expected Medical Readiness: 08/07/2023          B Medical/Surgery History Past Medical History:  Diagnosis Date   Aortic atherosclerosis (HCC) 01/23/2018   Atrial fibrillation (HCC)    Benign positional vertigo 10/06/2011   Carotid artery disease (HCC)    Chemotherapy induced neutropenia (HCC) 07/21/2019   Cholelithiasis 01/23/2018   Cholelithiasis 01/23/2018   Chronic anticoagulation    Colovesical fistula, ruled out 04/14/2022   Coronary artery disease    status post coronary artery bypass grafting times 07/10/2004   Diabetes mellitus    Hypercholesteremia    Hypertension    Mechanical heart valve present    H. aortic valve replacement at the time of bypass surgery October 2005   Moderate to severe pulmonary hypertension (HCC)    Peripheral arterial disease (HCC)    history of left common iliac artery PTA and stenting for a chronic total occlusion 08/26/01   S/P cholecystectomy 03/06/2018   Past Surgical History:  Procedure Laterality Date   anterior repair  2009   AORTIC VALVE REPLACEMENT  2005   Dr. Laneta Simmers   CARDIAC CATHETERIZATION  11/10/2004   40% right common illiac, 70% in stent restenosis of distal left common illiac,    CARDIAC CATHETERIZATION  05/18/2004   LAD 50-70% midstenosis, RCA dominant w/50% stenosis, 50% Right common Illiac artery ostial stenosis, 90% in stent restenosis within midportion of left common illiac stent   Carotid Duplex  03/12/2012   RSA-elev. velocities  suggestive of a 50-69% diameter reduction, Right&Left Bulb/Prox ICA-mild-mod.fibrous plaqueelevating Velocities abnormal study.   CHOLECYSTECTOMY N/A 03/01/2018   Procedure: LAPAROSCOPIC CHOLECYSTECTOMY;  Surgeon: Jimmye Norman, MD;  Location: [facility] OR;  Service: General;  Laterality: N/A;   COLONOSCOPY WITH PROPOFOL N/A 01/22/2018   Procedure: COLONOSCOPY WITH PROPOFOL;  Surgeon: Charlott Rakes, MD;  Location: [facility] ENDOSCOPY;  Service: Endoscopy;  Laterality: N/A;   ESOPHAGOGASTRODUODENOSCOPY N/A 07/13/2022   Procedure: ESOPHAGOGASTRODUODENOSCOPY (EGD);  Surgeon: Kerin Salen, MD;  Location: [facility] ENDOSCOPY;  Service: Gastroenterology;  Laterality: N/A;   ESOPHAGOGASTRODUODENOSCOPY (EGD) WITH PROPOFOL N/A 01/22/2018   Procedure: ESOPHAGOGASTRODUODENOSCOPY (EGD) WITH PROPOFOL;  Surgeon: Charlott Rakes, MD;  Location: [facility] ENDOSCOPY;  Service: Endoscopy;  Laterality: N/A;   ESOPHAGOGASTRODUODENOSCOPY (EGD) WITH PROPOFOL N/A 11/21/2018   Procedure: ESOPHAGOGASTRODUODENOSCOPY (EGD) WITH PROPOFOL;  Surgeon: Kerin Salen, MD;  Location: [facility] ENDOSCOPY;  Service: Gastroenterology;  Laterality: N/A;   ESOPHAGOGASTRODUODENOSCOPY (EGD) WITH PROPOFOL Left 02/27/2019   Procedure: ESOPHAGOGASTRODUODENOSCOPY (EGD) WITH PROPOFOL;  Surgeon: Willis Modena, MD;  Location: [facility] ENDOSCOPY;  Service: Endoscopy;  Laterality: Left;   GIVENS CAPSULE STUDY N/A 11/21/2018   Procedure: GIVENS CAPSULE STUDY;  Surgeon: Kerin Salen, MD;  Location: [facility] ENDOSCOPY;  Service: Gastroenterology;  Laterality: N/A;  To be deployed during EGD   Lower Ext. Duplex  03/12/2012   Right Proximal CIA- vessel narrowing w/elevated velocities 0-49% diameter reduction. Right SFA-mild mixed density plaque throughout vessel.   NM MYOCAR PERF [place] MOTION  05/19/2010   protocol: Persantine, post stress EF 65%, negative for ischemia, low risk scan   RIGHT HEART CATH N/A 06/27/2018   Procedure: RIGHT HEART CATH;  Surgeon: Laurey Morale, MD;  Location: [facility] INVASIVE  CV LAB;  Service: Cardiovascular;  Laterality: N/A;   TOTAL ABDOMINAL HYSTERECTOMY W/ BILATERAL SALPINGOOPHORECTOMY  1989   TRANSTHORACIC ECHOCARDIOGRAM  08/29/2012   Moderately calcified annulus of mitral valve, moderate regurg. of both mitral valve and tricuspid valve.      A IV Location/Drains/Wounds Patient Lines/Drains/Airways Status     Active Line/Drains/Airways     Name Placement date Placement time Site Days   Peripheral IV 08/02/23 22 G Right Wrist 08/02/23  1300  Wrist  4   Pressure Injury 01/13/23 Sacrum Medial;Bilateral Stage 2 -  Partial thickness loss of dermis presenting as a shallow open injury with a red, pink wound bed without slough. Wound is about the size of a quarter with linieal purple line through middle 01/13/23  1844  -- 205            Intake/Output Last 24 hours  Intake/Output Summary (Last 24 hours) at 08/06/2023 0148 Last data filed at 08/05/2023 2010 Gross per 24 hour  Intake 100 ml  Output --  Net 100 ml    Labs/Imaging Results for orders placed or performed during the hospital encounter of 08/05/23 (from the past 48 hour(s))  CBC with Differential     Status: Abnormal   Collection Time: 08/05/23  4:49 PM  Result Value Ref Range   WBC 13.1 (H) 4.0 - 10.5 K/uL  RBC 2.43 (L) 3.87 - 5.11 MIL/uL   Hemoglobin 7.7 (L) 12.0 - 15.0 g/dL   HCT 09.8 (L) 11.9 - 14.7 %   MCV 97.5 80.0 - 100.0 fL   MCH 31.7 26.0 - 34.0 pg   MCHC 32.5 30.0 - 36.0 g/dL   RDW 82.9 (H) 56.2 - 13.0 %   Platelets 120 (L) 150 - 400 K/uL   nRBC 0.0 0.0 - 0.2 %   Neutrophils Relative % 81 %   Neutro Abs 10.7 (H) 1.7 - 7.7 K/uL   Lymphocytes Relative 9 %   Lymphs Abs 1.2 0.7 - 4.0 K/uL   Monocytes Relative 8 %   Monocytes Absolute 1.0 0.1 - 1.0 K/uL   Eosinophils Relative 1 %   Eosinophils Absolute 0.2 0.0 - 0.5 K/uL   Basophils Relative 0 %   Basophils Absolute 0.0 0.0 - 0.1 K/uL   Immature Granulocytes 1 %   Abs Immature Granulocytes 0.07 0.00 - 0.07 K/uL     Comment: Performed at [facility], [address] [place], [place] [address]  Comprehensive metabolic panel     Status: Abnormal   Collection Time: 08/05/23  4:49 PM  Result Value Ref Range   Sodium 132 (L) 135 - 145 mmol/L   Potassium 4.6 3.5 - 5.1 mmol/L   Chloride 103 98 - 111 mmol/L   CO2 18 (L) 22 - 32 mmol/L   Glucose, Bld 161 (H) 70 - 99 mg/dL    Comment: Glucose reference range applies only to samples taken after fasting for at least 8 hours.   BUN 117 (H) 8 - 23 mg/dL    Comment: RESULT CONFIRMED BY MANUAL DILUTION   Creatinine, Ser 3.49 (H) 0.44 - 1.00 mg/dL   Calcium 8.7 (L) 8.9 - 10.3 mg/dL   Total Protein 5.8 (L) 6.5 - 8.1 g/dL   Albumin 3.3 (L) 3.5 - 5.0 g/dL   AST 32 15 - 41 U/L   ALT 16 0 - 44 U/L   Alkaline Phosphatase 97 38 - 126 U/L   Total Bilirubin 0.7 0.3 - 1.2 mg/dL   GFR, Estimated 12 (L) >60 mL/min    Comment: (NOTE) Calculated using the CKD-EPI Creatinine Equation (2021)    Anion gap 11 5 - 15    Comment: Performed at [facility], [address] [place], [place] [address]  Protime-INR     Status: Abnormal   Collection Time: 08/05/23  4:49 PM  Result Value Ref Range   Prothrombin Time 67.1 (H) 11.4 - 15.2 seconds   INR 8.0 (HH) 0.8 - 1.2    Comment: REPEATED TO VERIFY CRITICAL RESULT CALLED TO, READ BACK BY AND VERIFIED WITH: JENNA B @ 1735 ON [id number] BY HENDERSON L (NOTE) INR goal varies based on device and disease states. Performed at [facility], [address], [place], [place] [address]   Urinalysis, w/ Reflex to Culture (Infection Suspected) -Urine, Catheterized     Status: Abnormal   Collection Time: 08/05/23  6:59 PM  Result Value Ref Range   Specimen Source URINE, CATHETERIZED    Color, Urine YELLOW YELLOW   APPearance HAZY (A) CLEAR   Specific Gravity, Urine 1.012 1.005 - 1.030   pH 5.0 5.0 - 8.0   Glucose, UA NEGATIVE NEGATIVE mg/dL   Hgb urine dipstick MODERATE (A) NEGATIVE   Bilirubin Urine NEGATIVE NEGATIVE   Ketones, ur  NEGATIVE NEGATIVE mg/dL   Protein, ur 132 (A) NEGATIVE mg/dL   Nitrite NEGATIVE NEGATIVE   Leukocytes,Ua  SMALL (A) NEGATIVE   RBC / HPF >50 0 - 5 RBC/hpf   WBC, UA 21-50 0 - 5 WBC/hpf    Comment:        Reflex urine culture not performed if WBC <=10, OR if Squamous epithelial cells >5. If Squamous epithelial cells >5 suggest recollection.    Bacteria, UA MANY (A) NONE SEEN   Squamous Epithelial / HPF 0-5 0 - 5 /HPF   WBC Clumps PRESENT    Mucus PRESENT     Comment: Performed at [facility], [address], [place], [place] [address]  CBG monitoring, ED     Status: Abnormal   Collection Time: 08/05/23 11:10 PM  Result Value Ref Range   Glucose-Capillary 158 (H) 70 - 99 mg/dL    Comment: Glucose reference range applies only to samples taken after fasting for at least 8 hours.   *Note: Due to a large number of results and/or encounters for the requested time period, some results have not been displayed. A complete set of results can be found in Results Review.   CT ABDOMEN PELVIS WO CONTRAST  Result Date: 08/05/2023 CLINICAL DATA:  Nausea, vomiting and abdominal pain. EXAM: CT ABDOMEN AND PELVIS WITHOUT CONTRAST TECHNIQUE: Multidetector CT imaging of the abdomen and pelvis was performed following the standard protocol without IV contrast. RADIATION DOSE REDUCTION: This exam was performed according to the departmental dose-optimization program which includes automated exposure control, adjustment of the mA and/or kV according to patient size and/or use of iterative reconstruction technique. COMPARISON:  CT scan 01/13/2023 FINDINGS: Lower chest: Stable mild cardiac enlargement and advanced vascular calcifications. No pericardial effusion. Small hiatal hernia. No acute pulmonary findings. Stable basilar scarring changes and large calcified granuloma in the lingula. Hepatobiliary: No hepatic lesions are identified without contrast. The gallbladder is surgically absent. No biliary dilatation.  Pancreas: Stable moderate pancreatic atrophy but no mass or inflammation. Spleen: Normal size. Scattered calcified splenic granulomas and advanced splenic artery calcifications. Adrenals/Urinary Tract: The adrenal glands are unremarkable. Extensive renal vascular calcifications. No renal mass lesions or hydronephrosis. There is a large amount of gas in the bladder possibly related to recent instrumentation or catheterization. There is also gas in the non dependent wall of the bladder suggesting emphysematous cystitis. Recommend correlation with urinalysis. Stomach/Bowel: Severe sigmoid colon diverticulosis but no findings for acute diverticulitis. No findings for bowel obstruction or free air. Duodenal diverticuli noted. Vascular/Lymphatic: Severe vascular disease appears stable. No aneurysm. No adenopathy. Reproductive: Surgically absent. Other: Pelvic floor relaxation findings with rectovaginocystocele noted. Musculoskeletal: Stable degenerative changes involving the spine and hips. Remote L4 compression fracture. IMPRESSION: 1. Large amount of gas in the bladder possibly related to recent instrumentation or catheterization. There is also gas in the non dependent wall of the bladder suggesting emphysematous cystitis. Recommend correlation with urinalysis. 2. Severe sigmoid colon diverticulosis but no findings for acute diverticulitis. 3. No findings for bowel obstruction or free air. 4. Pelvic floor relaxation findings with rectovaginocystocele noted. 5. Severe vascular disease. Emphysema (ICD10-J43.9). Electronically Signed   By: Rudie Meyer M.D.   On: 08/05/2023 17:49    Pending Labs Unresulted Labs (From admission, onward)     Start     Ordered   08/06/23 0500  Protime-INR  Daily,   R      08/05/23 2222   08/05/23 2259  Hemoglobin A1c  Add-on,   AD       Comments: To assess prior glycemic control    08/05/23 2258   08/05/23  1859  Urine Culture  Once,   R        08/05/23 1859   Signed and Held   Basic metabolic panel  Tomorrow morning,   R        Signed and Held   Signed and Held  CBC  Tomorrow morning,   R        Signed and Held            Vitals/Pain Today's Vitals   08/05/23 2030 08/05/23 2215 08/05/23 2330 08/06/23 0100  BP: (!) 143/36 (!) 164/72 (!) 170/61 (!) 147/54  Pulse: 91 72 99 (!) 101  Resp: 18 20 20 19   Temp:      TempSrc:      SpO2: 92% 93% 98% 92%  Weight:      Height:      PainSc:        Isolation Precautions No active isolations  Medications Medications  HYDROmorphone (DILAUDID) injection 0.5 mg (has no administration in time range)  insulin aspart (novoLOG) injection 0-9 Units (has no administration in time range)  insulin aspart (novoLOG) injection 0-5 Units ( Subcutaneous Not Given 08/05/23 2311)  0.9 %  sodium chloride infusion ( Intravenous New Bag/Given 08/06/23 0133)  ceFEPIme (MAXIPIME) 2 g in sodium chloride 0.9 % 100 mL IVPB (2 g Intravenous New Bag/Given 08/06/23 0131)  morphine (PF) 4 MG/ML injection 4 mg (4 mg Intravenous Given 08/05/23 1729)  ondansetron (ZOFRAN) injection 4 mg (4 mg Intravenous Given 08/05/23 1730)  cefTRIAXone (ROCEPHIN) 2 g in sodium chloride 0.9 % 100 mL IVPB (0 g Intravenous Stopped 08/05/23 2010)  phytonadione (VITAMIN K) tablet 5 mg (5 mg Oral Given 08/05/23 2300)    Mobility walks with device     Focused Assessments Cardiac Assessment Handoff:    Lab Results  Component Value Date   CKTOTAL 39 02/04/2022   TROPONINI <0.03 02/24/2019   Lab Results  Component Value Date   DDIMER <0.27 02/13/2022   Does the Patient currently have chest pain? No    R Recommendations: See Admitting Provider Note  Report given to: [facility]  Additional Notes: Diabetic , Hx  of Afib , A&O x4 Ambulatory with assistance

## 2023-08-06 NOTE — Plan of Care (Signed)
Pt alert and oriented x 4. Up with assistance x 1. Weakness noted. DBP 47. Msg sent to provider. Clonidine held. Xanax ordered by on call provider and given per home regimen. Pt had compliant of nausea. Zofran given. Bruising noted to bilt upper and Lower extremities.  Problem: Education: Goal: Ability to describe self-care measures that may prevent or decrease complications (Diabetes Survival Skills Education) will improve Outcome: Progressing Goal: Individualized Educational Video(s) Outcome: Progressing   Problem: Coping: Goal: Ability to adjust to condition or change in health will improve Outcome: Progressing   Problem: Fluid Volume: Goal: Ability to maintain a balanced intake and output will improve Outcome: Progressing   Problem: Health Behavior/Discharge Planning: Goal: Ability to identify and utilize available resources and services will improve Outcome: Progressing Goal: Ability to manage health-related needs will improve Outcome: Progressing   Problem: Metabolic: Goal: Ability to maintain appropriate glucose levels will improve Outcome: Progressing   Problem: Nutritional: Goal: Maintenance of adequate nutrition will improve Outcome: Progressing Goal: Progress toward achieving an optimal weight will improve Outcome: Progressing   Problem: Skin Integrity: Goal: Risk for impaired skin integrity will decrease Outcome: Progressing   Problem: Tissue Perfusion: Goal: Adequacy of tissue perfusion will improve Outcome: Progressing   Problem: Education: Goal: Knowledge of General Education information will improve Description: Including pain rating scale, medication(s)/side effects and non-pharmacologic comfort measures Outcome: Progressing   Problem: Health Behavior/Discharge Planning: Goal: Ability to manage health-related needs will improve Outcome: Progressing   Problem: Clinical Measurements: Goal: Ability to maintain clinical measurements within normal limits  will improve Outcome: Progressing Goal: Will remain free from infection Outcome: Progressing Goal: Diagnostic test results will improve Outcome: Progressing Goal: Respiratory complications will improve Outcome: Progressing Goal: Cardiovascular complication will be avoided Outcome: Progressing   Problem: Activity: Goal: Risk for activity intolerance will decrease Outcome: Progressing   Problem: Nutrition: Goal: Adequate nutrition will be maintained Outcome: Progressing   Problem: Coping: Goal: Level of anxiety will decrease Outcome: Progressing   Problem: Elimination: Goal: Will not experience complications related to bowel motility Outcome: Progressing Goal: Will not experience complications related to urinary retention Outcome: Progressing   Problem: Pain Management: Goal: General experience of comfort will improve Outcome: Progressing   Problem: Safety: Goal: Ability to remain free from injury will improve Outcome: Progressing   Problem: Skin Integrity: Goal: Risk for impaired skin integrity will decrease Outcome: Progressing

## 2023-08-06 NOTE — Progress Notes (Signed)
Mobility Specialist Progress Note:    08/06/23 1140  Mobility  Activity Ambulated with assistance in hallway  Level of Assistance Minimal assist, patient does 75% or more  Assistive Device Front wheel walker  Distance Ambulated (ft) 100 ft  Range of Motion/Exercises Active;All extremities  Activity Response Tolerated well  Mobility Referral Yes  $Mobility charge 1 Mobility  Mobility Specialist Start Time (ACUTE ONLY) 1140  Mobility Specialist Stop Time (ACUTE ONLY) 1155  Mobility Specialist Time Calculation (min) (ACUTE ONLY) 15 min   Pt received in bed, visitor in room. Agreeable to mobility, required MinA to stand and CGA to ambulate with RW. Tolerated well, asx throughout. Returned pt to room, left sitting EOB for lunch. All needs met.   Lawerance Bach Mobility Specialist Please contact via Special educational needs teacher or  Rehab office at [phone]

## 2023-08-06 NOTE — Progress Notes (Addendum)
Progress Note   Patient: [name] QIO:[id number] DOB: 07-22-1941 DOA: 08/05/2023     1 DOS: the patient was seen and examined on 08/06/2023   Brief hospital admission course: As per H&P written by Dr. Mariea Clonts on 08/05/2023 [name] is a 82 y.o. female with medical history significant for multiple medical condition including emphysematous cystitis, CKD 4, diastolic CHF, AIHA, mechanical aortic valve, hypertension and pulmonary hypertension, atrial fibrillation, GI bleed, peripheral artery disease, diabetes mellitus. Patient presented to the ED with complaints of nausea vomiting and lower abdominal pain.  Reports 1 episode of vomiting today.  No blood in vomitus, she reports a spot of blood in her urine, but no blood in stools.  Reports last week for 2 days her dose of warfarin was doubled, her INR was low at 1.4, it improved to 2.9 but dose was still adjusted.   ED Course:.  Tmax 98.  Heart rate 72-92.  Respirate rate 16-27.  Blood pressure systolic 130s to 324M.  O2 sats greater than 92% on room air. WBC 13.1. INR elevated at 80. UA with small leukocytes many bacteria. CT abdomen and pelvis Wo contrast-large amount of gas in bladder-instrumentation or catheterization, gas and nondependent wall of bladder suggesting emphysematous cystitis. IV ceftriaxone 2 g given.  Assessment and Plan: * Emphysematous cystitis -Continue IV cefepime -Maintain adequate hydration -Follow culture results and sensitivity -Continue supportive care.  Supratherapeutic INR INR 8.  Hgb 7.7 at time of admission -Hemoglobin 7.1 and INR down to 5.8 -Patient will receive extra dose of vitamin K, continue holding warfarin and follow INR level. -No overt bleeding appreciated.  AKI on CKD (chronic kidney disease), stage IV at baseline (HCC) -Cr-3.49 at time of admission;, gradual uptrending creatinine over the past few months.   -Continue patient follow-up with nephrology service -Continue minimizing  nephrotoxic agents, avoiding hypotension and maintaining adequate hydration -Continue treatment of UTI -Creatinine 3.19 currently.  PAF (paroxysmal atrial fibrillation) (HCC) -Rate controlled and is stable patient receiving outpatient  anticoagulation with warfarin.   -Not on rate limiting drugs. -Currently holding warfarin in the setting of supratherapeutic INR.  Chronic diastolic CHF (congestive heart failure) (HCC) -Stable and compensated.   -Last echo 02/2023 EF of 65 to 70%, estimated RV systolic pressure of 52. -Continue holding diuretics and providing judicious fluid resuscitation -Follow daily weights, volume status and clinical response.  Moderate to severe pulmonary hypertension (HCC) -Continue tadalafil -Continue outpatient follow-up with cardiology/pulmonology service.  Diabetes mellitus without complication (HCC) -A1c 6.2 -Continue sliding scale insulin and follow CBG fluctuation.  Mechanical heart valve present -On chronic anticoagulation with warfarin.   -Currently with supratherapeutic INR -Continue following trend and maintain warfarin level stability.  Essential hypertension -Overall stable.   -Continue to follow vital signs and further adjust antihypertensive agents as required.   Subjective:  Still reporting some suprapubic discomfort; expressed no further vomiting.  Patient denies chest pain and is currently afebrile.  Good saturation on room air.  Physical Exam: Vitals:   08/06/23 0211 08/06/23 0719 08/06/23 0836 08/06/23 1331  BP: (!) 159/47  (!) 169/53 (!) 151/53  Pulse: 95  84 82  Resp: 20  17 17   Temp: 98.6 F (37 C)  98.5 F (36.9 C) 98.4 F (36.9 C)  TempSrc:      SpO2: 100% 100% 100% 98%  Weight: 47.3 kg     Height: 4\' 8"  (1.422 m)      General exam: Alert, awake, oriented x 3; chest pain, no nausea  or vomiting currently; reporting suprapubic tenderness and generalized weakness. Respiratory system: Clear to auscultation. Respiratory  effort normal.  Good saturation on room air. Cardiovascular system: Rate controlled, no rubs, no gallops, no JVD. Gastrointestinal system: Abdomen is nondistended, soft and nontender. No organomegaly or masses felt. Normal bowel sounds heard. Central nervous system: Moving 4 limbs spontaneously.  No focal neurological deficits. Extremities: No cyanosis or clubbing. Skin: No petechiae. Psychiatry: Judgement and insight appear normal. Mood & affect appropriate.   Data Reviewed: INR: 5.8 Basic metabolic panel: Sodium 136, potassium 4.7, chloride 107, bicarb 15, BUN 107, creatinine 3.19 and GFR 14 CBC: WBCs 11.8, hemoglobin 7.1 and platelet counts 124K.  Family Communication: No family at bedside.  Disposition: Status is: Inpatient Remains inpatient appropriate because: Continue IV antibiotics.   Planned Discharge Destination: Home  Time spent: 50 minutes  Author: Vassie Loll, MD 08/06/2023 3:45 PM  For on call review [email or web address].

## 2023-08-06 NOTE — ED Notes (Signed)
Patient was assisted to the bathroom via wheel chair and back to the room.

## 2023-08-07 ENCOUNTER — Other Ambulatory Visit (HOSPITAL_COMMUNITY): Payer: Medicare HMO

## 2023-08-07 DIAGNOSIS — N3080 Other cystitis without hematuria: Secondary | ICD-10-CM | POA: Diagnosis not present

## 2023-08-07 DIAGNOSIS — R791 Abnormal coagulation profile: Secondary | ICD-10-CM | POA: Diagnosis not present

## 2023-08-07 DIAGNOSIS — I5032 Chronic diastolic (congestive) heart failure: Secondary | ICD-10-CM | POA: Diagnosis not present

## 2023-08-07 DIAGNOSIS — N184 Chronic kidney disease, stage 4 (severe): Secondary | ICD-10-CM | POA: Diagnosis not present

## 2023-08-07 LAB — BASIC METABOLIC PANEL
Anion gap: 10 (ref 5–15)
BUN: 83 mg/dL — ABNORMAL HIGH (ref 8–23)
CO2: 16 mmol/L — ABNORMAL LOW (ref 22–32)
Calcium: 8.8 mg/dL — ABNORMAL LOW (ref 8.9–10.3)
Chloride: 111 mmol/L (ref 98–111)
Creatinine, Ser: 2.46 mg/dL — ABNORMAL HIGH (ref 0.44–1.00)
GFR, Estimated: 19 mL/min — ABNORMAL LOW (ref >60)
Glucose, Bld: 139 mg/dL — ABNORMAL HIGH (ref 70–99)
Potassium: 4.5 mmol/L (ref 3.5–5.1)
Sodium: 137 mmol/L (ref 135–145)

## 2023-08-07 LAB — CBC
HCT: 21.9 % — ABNORMAL LOW (ref 36.0–46.0)
Hemoglobin: 6.6 g/dL — CL (ref 12.0–15.0)
MCH: 30.7 pg (ref 26.0–34.0)
MCHC: 30.1 g/dL (ref 30.0–36.0)
MCV: 101.9 fL — ABNORMAL HIGH (ref 80.0–100.0)
Platelets: 144 K/uL — ABNORMAL LOW (ref 150–400)
RBC: 2.15 MIL/uL — ABNORMAL LOW (ref 3.87–5.11)
RDW: 16.3 % — ABNORMAL HIGH (ref 11.5–15.5)
WBC: 8.8 K/uL (ref 4.0–10.5)
nRBC: 0.2 % (ref 0.0–0.2)

## 2023-08-07 LAB — PROTIME-INR
INR: 1.4 — ABNORMAL HIGH (ref 0.8–1.2)
Prothrombin Time: 17.4 s — ABNORMAL HIGH (ref 11.4–15.2)

## 2023-08-07 LAB — GLUCOSE, CAPILLARY
Glucose-Capillary: 130 mg/dL — ABNORMAL HIGH (ref 70–99)
Glucose-Capillary: 158 mg/dL — ABNORMAL HIGH (ref 70–99)
Glucose-Capillary: 152 mg/dL — ABNORMAL HIGH (ref 70–99)
Glucose-Capillary: 140 mg/dL — ABNORMAL HIGH (ref 70–99)

## 2023-08-07 LAB — PREPARE RBC (CROSSMATCH)

## 2023-08-07 MED ORDER — DARBEPOETIN ALFA 40 MCG/0.4ML IJ SOSY
40.0000 ug | PREFILLED_SYRINGE | Freq: Once | INTRAMUSCULAR | Status: AC
  Administered 2023-08-07: 40 ug via SUBCUTANEOUS
  Filled 2023-08-07: qty 0.4

## 2023-08-07 MED ORDER — SODIUM BICARBONATE 650 MG PO TABS
650.0000 mg | ORAL_TABLET | Freq: Two times a day (BID) | ORAL | Status: DC
  Administered 2023-08-07 – 2023-08-09 (×4): 650 mg via ORAL
  Filled 2023-08-07 (×4): qty 1

## 2023-08-07 MED ORDER — ALPRAZOLAM 0.25 MG PO TABS
0.2500 mg | ORAL_TABLET | Freq: Once | ORAL | Status: AC
  Administered 2023-08-07: 0.25 mg via ORAL
  Filled 2023-08-07: qty 1

## 2023-08-07 MED ORDER — SODIUM CHLORIDE 0.9% IV SOLUTION: Freq: Once | INTRAVENOUS | Status: AC

## 2023-08-07 NOTE — Plan of Care (Signed)
[name] has not eaten since 08/06/23, does not have an appetite. [name] also has critical lab levels for Hemoglobin of 6.6. A. Zierle-Ghosh notified @t  06:04. Will continue to monitor [name] during the remainder of nursing shift.

## 2023-08-07 NOTE — Progress Notes (Signed)
Nurse at bedside starting blood transfusion,patient has been educated verbalized understanding. Upon starting blood,patient stated ' I feel sick all of a sudden " N/V NOTED blood pressure was 206/92,hr 139, Dr Gwenlyn Perking notified. Blood was stopped. Orders received from MD and given . Blood resumed . Plan of care on going. Following up on blood pressure at this time 160/69,hr 94,temp 98.2 oral,room air saturation 99 percent. Patient looks much better ,nausea had resolved .[name]

## 2023-08-07 NOTE — Progress Notes (Signed)
Progress Note   Patient: [name] ION:[id number] DOB: [birth year] DOA: 08/05/2023     2 DOS: the patient was seen and examined on 08/07/2023   Brief hospital admission course: As per H&P written by Dr. Mariea Clonts on 08/05/2023 [name] is a 82 y.o. female with medical history significant for multiple medical condition including emphysematous cystitis, CKD 4, diastolic CHF, AIHA, mechanical aortic valve, hypertension and pulmonary hypertension, atrial fibrillation, GI bleed, peripheral artery disease, diabetes mellitus. Patient presented to the ED with complaints of nausea vomiting and lower abdominal pain.  Reports 1 episode of vomiting today.  No blood in vomitus, she reports a spot of blood in her urine, but no blood in stools.  Reports last week for 2 days her dose of warfarin was doubled, her INR was low at 1.4, it improved to 2.9 but dose was still adjusted.   ED Course:.  Tmax 98.  Heart rate 72-92.  Respirate rate 16-27.  Blood pressure systolic 130s to 244W.  O2 sats greater than 92% on room air. WBC 13.1. INR elevated at 80. UA with small leukocytes many bacteria. CT abdomen and pelvis Wo contrast-large amount of gas in bladder-instrumentation or catheterization, gas and nondependent wall of bladder suggesting emphysematous cystitis. IV ceftriaxone 2 g given.  Assessment and Plan: * Emphysematous cystitis -Continue IV cefepime -Continue to maintain adequate hydration -Follow final culture results and sensitivity. -Continue supportive care and maintain adequate hydration..  Supratherapeutic INR INR 8.  Hgb 7.7 at time of admission -Hemoglobin 7.1 and INR down to 5.8 -After vitamin K and transfusion INR down to 1.4 -No overt bleeding appreciated. -Resume the use of warfarin pharmacy to dose -Follow INR level.  Chronic anemia -With concern for anemia of chronic disease and also possible stone MDS. -Routinely visit with hematology as an outpatient for iron infusion  and transfusions. -Hemoglobin down to 6.8 -1 unit PRBCs will be provided -Epogen x 1 will also be given -Follow hemoglobin trend and continue further follow-up with hematology as an outpatient.  AKI on CKD (chronic kidney disease), stage IV at baseline (HCC) -Cr-3.49 at time of admission;, gradual uptrending creatinine over the past few months.   -Continue patient follow-up with nephrology service -Continue minimizing nephrotoxic agents and continue  avoiding hypotension. -Continue treatment of UTI -Creatinine continue to demonstrate improvement and currently down to 2.46. -Continue to maintain adequate hydration and follow renal function trend.    PAF (paroxysmal atrial fibrillation) (HCC) -Rate controlled and is stable patient receiving outpatient  anticoagulation with warfarin.   -Not on any rate limiting drugs. -Currently holding warfarin in the setting of supratherapeutic INR.  Chronic diastolic CHF (congestive heart failure) (HCC) -Stable and compensated.   -Last echo 02/2023 EF of 65 to 70%, estimated RV systolic pressure of 52. -Continue holding diuretics at the moment -Patient advised to maintain adequate oral hydration. -Follow daily weights, volume status and clinical response.  Moderate to severe pulmonary hypertension (HCC) -Continue tadalafil -Continue outpatient follow-up with cardiology/pulmonology service.  Diabetes mellitus without complication (HCC) -A1c 6.2 -Continue to follow CBG fluctuation and further adjust hypoglycemic regimen as required. -Continue sliding scale insulin.  Mechanical heart valve present -On chronic anticoagulation with warfarin.   -Currently with supratherapeutic INR -Continue following trend and maintain warfarin level stability.  Essential hypertension -Overall stable.   -Continue to follow vital signs and further adjust antihypertensive agents as required.  Metabolic acidosis -In the setting of worsening renal failure and  chronic kidney disease -Sodium bicarbonate twice a  day will be initiated -Continue to follow electrolytes.   Subjective:  No chest pain, good saturation on room air.  Reporting some intermittent nausea, but no vomiting.  Patient is afebrile.  Physical Exam: Vitals:   08/07/23 1209 08/07/23 1215 08/07/23 1418 08/07/23 1806  BP: (!) 206/92 (!) 160/69 (!) 141/59 (!) 157/66  Pulse: (!) 139 94 76 87  Resp: 20 19 19    Temp: 98.1 F (36.7 C) 98.2 F (36.8 C) (!) 97.5 F (36.4 C) 98.3 F (36.8 C)  TempSrc: Oral Oral Oral Oral  SpO2:    99%  Weight:      Height:       General exam: Alert, awake, oriented x 3; no dysuria, no chest pain, no vomiting.  Overall feeling better.  Weak and chronically ill in appearance. Respiratory system: No using accessory muscle.  Good saturation on room air. Cardiovascular system: Irregular, no rubs, no gallops. Gastrointestinal system: Abdomen is nondistended, soft and nontender. No organomegaly or masses felt. Normal bowel sounds heard. Central nervous system:  No focal neurological deficits. Extremities: No cyanosis or clubbing. Skin: No petechiae. Psychiatry: Judgement and insight appear normal. Mood & affect appropriate.    Data Reviewed: INR: 1.4 Basic metabolic panel: Sodium 137, potassium 4.5, chloride 111, bicarb 16, BUN 83, creatinine 2.46 and GFR 19. CBC: WBCs 8.8, hemoglobin 6.6 and platelet count 144K  Family Communication: No family at bedside.  Disposition: Status is: Inpatient Remains inpatient appropriate because: Continue IV antibiotics.   Planned Discharge Destination: Home  Time spent: 50 minutes  Author: Vassie Loll, MD 08/07/2023 6:25 PM  For on call review [email or web address].

## 2023-08-07 NOTE — Plan of Care (Signed)
  Problem: Metabolic: Goal: Ability to maintain appropriate glucose levels will improve Outcome: Progressing   Problem: Metabolic: Goal: Ability to maintain appropriate glucose levels will improve Outcome: Progressing   Problem: Skin Integrity: Goal: Risk for impaired skin integrity will decrease Outcome: Progressing

## 2023-08-08 DIAGNOSIS — N3080 Other cystitis without hematuria: Secondary | ICD-10-CM | POA: Diagnosis not present

## 2023-08-08 LAB — TYPE AND SCREEN
ABO/RH(D): A POS
Antibody Screen: NEGATIVE
Unit division: 0

## 2023-08-08 LAB — BASIC METABOLIC PANEL
Anion gap: 10 (ref 5–15)
BUN: 66 mg/dL — ABNORMAL HIGH (ref 8–23)
CO2: 17 mmol/L — ABNORMAL LOW (ref 22–32)
Calcium: 9.3 mg/dL (ref 8.9–10.3)
Chloride: 111 mmol/L (ref 98–111)
Creatinine, Ser: 2.12 mg/dL — ABNORMAL HIGH (ref 0.44–1.00)
GFR, Estimated: 23 mL/min — ABNORMAL LOW (ref >60)
Glucose, Bld: 118 mg/dL — ABNORMAL HIGH (ref 70–99)
Potassium: 4.6 mmol/L (ref 3.5–5.1)
Sodium: 138 mmol/L (ref 135–145)

## 2023-08-08 LAB — GLUCOSE, CAPILLARY
Glucose-Capillary: 127 mg/dL — ABNORMAL HIGH (ref 70–99)
Glucose-Capillary: 147 mg/dL — ABNORMAL HIGH (ref 70–99)
Glucose-Capillary: 171 mg/dL — ABNORMAL HIGH (ref 70–99)
Glucose-Capillary: 135 mg/dL — ABNORMAL HIGH (ref 70–99)

## 2023-08-08 LAB — BPAM RBC
Blood Product Expiration Date: 202411142359
ISSUE DATE / TIME: 202410291141
Unit Type and Rh: 6200

## 2023-08-08 LAB — CBC
HCT: 28.3 % — ABNORMAL LOW (ref 36.0–46.0)
Hemoglobin: 9 g/dL — ABNORMAL LOW (ref 12.0–15.0)
MCH: 31.1 pg (ref 26.0–34.0)
MCHC: 31.8 g/dL (ref 30.0–36.0)
MCV: 97.9 fL (ref 80.0–100.0)
Platelets: 170 10*3/uL (ref 150–400)
RBC: 2.89 MIL/uL — ABNORMAL LOW (ref 3.87–5.11)
RDW: 18.2 % — ABNORMAL HIGH (ref 11.5–15.5)
WBC: 6.9 10*3/uL (ref 4.0–10.5)
nRBC: 0.3 % — ABNORMAL HIGH (ref 0.0–0.2)

## 2023-08-08 LAB — PROTIME-INR
INR: 1.1 (ref 0.8–1.2)
Prothrombin Time: 14.2 s (ref 11.4–15.2)

## 2023-08-08 MED ORDER — ALPRAZOLAM 0.5 MG PO TABS
0.5000 mg | ORAL_TABLET | Freq: Every day | ORAL | Status: DC
  Administered 2023-08-08: 0.5 mg via ORAL
  Filled 2023-08-08: qty 1

## 2023-08-08 MED ORDER — WARFARIN SODIUM 7.5 MG PO TABS
7.5000 mg | ORAL_TABLET | Freq: Once | ORAL | Status: AC
  Administered 2023-08-08: 7.5 mg via ORAL
  Filled 2023-08-08: qty 1

## 2023-08-08 MED ORDER — WARFARIN - PHARMACIST DOSING INPATIENT: Freq: Every day | Status: DC

## 2023-08-08 NOTE — Progress Notes (Signed)
PT Cancellation Note  Patient Details Name: [name] MRN: [id number] DOB: June 25, 1941   Cancelled Treatment:      Low hemoglobin and RBC count will hold Virgina Organ, PT CLT [phone]  08/08/2023, 11:28 AM

## 2023-08-08 NOTE — Progress Notes (Signed)
Mobility Specialist Progress Note:    08/08/23 1210  Mobility  Activity Ambulated with assistance in hallway  Level of Assistance Contact guard assist, steadying assist  Assistive Device Front wheel walker  Distance Ambulated (ft) 140 ft  Range of Motion/Exercises Active;All extremities  Activity Response Tolerated well  Mobility Referral Yes  $Mobility charge 1 Mobility  Mobility Specialist Start Time (ACUTE ONLY) 1210  Mobility Specialist Stop Time (ACUTE ONLY) 1225  Mobility Specialist Time Calculation (min) (ACUTE ONLY) 15 min   Pt received in bed, family in room. Agreeable to mobility, required CGA to stand and ambulate with RW. Tolerated well, asx throughout. Returned pt to room, left supine. Alarm on, all needs met.   Lawerance Bach Mobility Specialist Please contact via Special educational needs teacher or  Rehab office at [phone]

## 2023-08-08 NOTE — Plan of Care (Signed)
Pt improving and progressing. Hemoglobin levels improved. Pt nausea has decreased.

## 2023-08-08 NOTE — Progress Notes (Signed)
PHARMACY - ANTICOAGULATION CONSULT NOTE  Pharmacy Consult for Warfarin Indication: atrial fibrillation  Allergies  Allergen Reactions   Amlodipine Swelling   Amoxicillin-Pot Clavulanate Diarrhea, Nausea And Vomiting and Other (See Comments)   Carvedilol Other (See Comments)    Terrible cramping in the feet and had a lot of bowel movements, but not diarrhea   Chlorthalidone Other (See Comments)   Ciprofloxacin Diarrhea, Nausea Only and Other (See Comments)    Stomach ache   Codeine Other (See Comments)    Insomnia, anxiety   Escitalopram Other (See Comments)    Other Reaction(s): made head feel funny   Ezetimibe Other (See Comments)    Reaction not recalled   Fluconazole Diarrhea and Other (See Comments)   Immune Globulin (Human) Other (See Comments)    Severe/excruciating pain   Levofloxacin In D5w     Vomiting, diarrhea, insomnia   Losartan Other (See Comments) and Swelling    Patient doesn't recall site of swelling   Lovastatin     Other Reaction(s): do not remember   Niacin     Other Reaction(s): made her feel hot all over   Sertraline     Other Reaction(s): made head feel funny   Sulfamethoxazole-Trimethoprim Diarrhea, Nausea Only and Other (See Comments)    Stomach upset   Verapamil Hives and Other (See Comments)   Zocor [Simvastatin - High Dose] Other (See Comments)    Reaction not recalled   Gatifloxacin Other (See Comments) and Rash    Redness to skin around eye   Simvastatin Other (See Comments) and Rash    Patient Measurements: Height: 4\' 8"  (142.2 cm) Weight: 47.3 kg (104 lb 4.4 oz) IBW/kg (Calculated) : 36.3  Vital Signs: Temp: 98 F (36.7 C) (10/30 0406) Temp Source: Oral (10/30 0406) BP: 181/64 (10/30 0406) Pulse Rate: 67 (10/30 0406)  Labs: Recent Labs    08/06/23 0439 08/07/23 0424 08/08/23 0432  HGB 7.1* 6.6* 9.0*  HCT 22.8* 21.9* 28.3*  PLT 124* 144* 170  LABPROT 52.5* 17.4* 14.2  INR 5.8* 1.4* 1.1  CREATININE 3.19* 2.46* 2.12*     Estimated Creatinine Clearance: 12.7 mL/min (A) (by C-G formula based on SCr of 2.12 mg/dL (H)).   Medical History: Past Medical History:  Diagnosis Date   Aortic atherosclerosis (HCC) 01/23/2018   Atrial fibrillation (HCC)    Benign positional vertigo 10/06/2011   Carotid artery disease (HCC)    Chemotherapy induced neutropenia (HCC) 07/21/2019   Cholelithiasis 01/23/2018   Cholelithiasis 01/23/2018   Chronic anticoagulation    Colovesical fistula, ruled out 04/14/2022   Coronary artery disease    status post coronary artery bypass grafting times 07/10/2004   Diabetes mellitus    Hypercholesteremia    Hypertension    Mechanical heart valve present    H. aortic valve replacement at the time of bypass surgery October 2005   Moderate to severe pulmonary hypertension (HCC)    Peripheral arterial disease (HCC)    history of left common iliac artery PTA and stenting for a chronic total occlusion 08/26/01   S/P cholecystectomy 03/06/2018    Medications:  Medications Prior to Admission  Medication Sig Dispense Refill Last Dose   acetaminophen (TYLENOL) 500 MG tablet Take 500-1,000 mg by mouth every 6 (six) hours as needed for mild pain (pain score 1-3) or fever.   Past Week   allopurinol (ZYLOPRIM) 100 MG tablet Take 100 mg by mouth daily.   08/05/2023   ALPRAZolam (XANAX) 0.5 MG tablet Take 0.5 mg  by mouth at bedtime.   08/04/2023   amLODipine (NORVASC) 10 MG tablet Take 1 tablet (10 mg total) by mouth daily. (Patient taking differently: Take 10 mg by mouth at bedtime.) 90 tablet 3 08/04/2023   aspirin EC 81 MG tablet Take 81 mg by mouth daily. Swallow whole.   08/05/2023 at 0900   atorvastatin (LIPITOR) 80 MG tablet Take 1 tablet (80 mg total) by mouth daily at 6 PM.   08/04/2023   Cholecalciferol (VITAMIN D3) 50 MCG (2000 UT) capsule Take 2,000 Units by mouth daily with lunch.   08/05/2023   cloNIDine (CATAPRES) 0.2 MG tablet Take 0.2 mg by mouth 2 (two) times daily.   08/05/2023    Cyanocobalamin (VITAMIN B12) 1000 MCG TBCR Take 1,000 mcg by mouth daily with lunch.   08/05/2023   famotidine (PEPCID) 20 MG tablet Take 20 mg by mouth 2 (two) times daily.   08/05/2023   ferrous sulfate 325 (65 FE) MG tablet Take 1 tablet (325 mg total) by mouth daily with breakfast. (Patient taking differently: Take 650 mg by mouth daily with breakfast.)  3 08/05/2023   furosemide (LASIX) 40 MG tablet Patient take 1.5 (60 mg) tablet by mouth in the morning and 1.5 (60 mg) tablet at night. (Patient taking differently: Take 60 mg by mouth 2 (two) times daily.)   08/04/2023   hydrALAZINE (APRESOLINE) 100 MG tablet Take 1 tablet (100 mg total) by mouth 3 (three) times daily. 270 tablet 3 08/05/2023   hydrocortisone (ANUSOL-HC) 2.5 % rectal cream Place 1 Application rectally 2 (two) times daily. For 14 days. May repeat if needed. 30 g 1 Past Month   insulin aspart (NOVOLOG) 100 UNIT/ML injection Inject 2-8 Units into the skin 3 (three) times daily with meals. Per sliding scale, If blood sugar is 200 to 250, give 2 units. If blood sugar is 251 to 300, give 4 units. If blood sugar is 301 to 350, give 6 units. If blood sugar is greater than 350, give 8 units and call MD.   08/04/2023   ondansetron (ZOFRAN-ODT) 4 MG disintegrating tablet Take 4 mg by mouth every 8 (eight) hours as needed for nausea or vomiting.   08/04/2023   pantoprazole (PROTONIX) 40 MG tablet Take 1 tablet (40 mg total) by mouth 2 (two) times daily. 60 tablet 2 08/05/2023   senna-docusate (SENOKOT-S) 8.6-50 MG tablet Take 2 tablets by mouth at bedtime. (Patient taking differently: Take 2 tablets by mouth at bedtime as needed for mild constipation.) 60 tablet 2 08/04/2023   sertraline (ZOLOFT) 25 MG tablet Take 25 mg by mouth at bedtime.   08/04/2023   simethicone (GAS-X) 80 MG chewable tablet Chew 1 tablet (80 mg total) by mouth 4 (four) times daily as needed for flatulence. 100 tablet 2 08/04/2023   spironolactone (ALDACTONE) 25 MG  tablet TAKE 1 TABLET EVERY DAY (Patient taking differently: Take 25 mg by mouth at bedtime.) 90 tablet 3 08/04/2023   tadalafil, PAH, (ADCIRCA) 20 MG tablet TAKE 2 TABLETS BY MOUTH ONCE DAILY (Patient taking differently: Take 40 mg by mouth at bedtime.) 60 tablet 11 08/04/2023   warfarin (COUMADIN) 5 MG tablet Take 1 tablet (5 mg total) by mouth See admin instructions. Take 1 tablet 5mg  on Tuesday/Thursday/Sunday and 1/2 Tab (2.5 mg) on MWFSat (Patient taking differently: Take 2.5-5 mg by mouth See admin instructions. Take 1 tablet by mouth at 5 pm on Tues, Thurs, and Sun, and 0.5 tablet on Mon, Wed, Fri, and Sat.)  08/04/2023 at 1700   Insulin Syringe-Needle U-100 (B-D INS SYR ULTRAFINE .3CC/29G) 29G X 1/2" 0.3 ML MISC Use for sliding scale for breakthrough high blood sugar readings 100 each 1    nitrofurantoin (MACRODANTIN) 100 MG capsule Take 100 mg by mouth 2 (two) times daily.   past month    Assessment: 82 y.o. female with medical history significant for multiple medical condition including emphysematous cystitis, CKD 4, diastolic CHF, AIHA, mechanical aortic valve, hypertension and pulmonary hypertension, atrial fibrillation, GI bleed, peripheral artery disease, diabetes mellitus. She patient presented to the ED with complaints of nausea vomiting and lower abdominal pain.  Patient chronically anticoagulated with warfarin and had Supratherapeutic INR 8.  Hgb 7.7 at time of admission. Vitamin K(10/27 5mg / 10/28 2.5mg ) and transfusion given.  No overt bleeding appreciated. Now plan to resume the use of warfarin. Pharmacy to dose  INR 1.1 today  PTA dose 10mg  on Mon, Wed, and Fri, and 5mg  all other days  Goal of Therapy:  INR 2-3 Monitor platelets by anticoagulation protocol: Yes   Plan:  Coumadin 7.5mg  po x 1 today INR daily Monitor for S/S of bleeding  Elder Cyphers, BS Pharm D, BCPS Clinical Pharmacist 08/08/2023,7:27 AM

## 2023-08-08 NOTE — Plan of Care (Signed)
Pt improving and progressing. Tolerated abt therapy well. Hemoglobin level improved to a '9'.

## 2023-08-08 NOTE — Progress Notes (Signed)
PROGRESS NOTE    [name]  WUJ:[id number] DOB: 1941-03-15 DOA: 08/05/2023 PCP: Gaspar Garbe, MD   Brief Narrative:    [name] is a 82 y.o. female with medical history significant for multiple medical condition including emphysematous cystitis, CKD 4, diastolic CHF, AIHA, mechanical aortic valve, hypertension and pulmonary hypertension, atrial fibrillation, GI bleed, peripheral artery disease, diabetes mellitus. Patient presented to the ED with complaints of nausea vomiting and lower abdominal pain.  She was admitted for emphysematous cystitis that is related to Klebsiella.  Assessment & Plan:   Principal Problem:   Emphysematous cystitis Active Problems:   Supratherapeutic INR   Chronic diastolic CHF (congestive heart failure) (HCC)   PAF (paroxysmal atrial fibrillation) (HCC)   CKD (chronic kidney disease), stage IV (HCC)   Essential hypertension   Mechanical heart valve present   Diabetes mellitus without complication (HCC)   Moderate to severe pulmonary hypertension (HCC)  Assessment and Plan:   Emphysematous cystitis -Continue IV cefepime -Continue to maintain adequate hydration -Follow final culture results and sensitivity. -Continue supportive care and maintain adequate hydration.. -Related to Klebsiella with final sensitivities still pending   Supratherapeutic INR INR 8.  Hgb 7.7 at time of admission -Hemoglobin 7.1 and INR down to 5.8 -After vitamin K and transfusion INR down to 1.4 -No overt bleeding appreciated. -Resume the use of warfarin pharmacy to dose -Follow INR level.   Chronic anemia-improved -With concern for anemia of chronic disease and also possible stone MDS. -Routinely visit with hematology as an outpatient for iron infusion and transfusions. -Hemoglobin down to 6.8 -1 unit PRBCs will be provided -Epogen x 1 will also be given -Follow hemoglobin trend and continue further follow-up with hematology as an outpatient.   AKI  on CKD (chronic kidney disease), stage IV at baseline (HCC) -Cr-3.49 at time of admission;, gradual uptrending creatinine over the past few months.   -Continue patient follow-up with nephrology service -Continue minimizing nephrotoxic agents and continue  avoiding hypotension. -Continue treatment of UTI -Creatinine continue to demonstrate improvement and currently downward trending -Continue to maintain adequate hydration and follow renal function trend.     PAF (paroxysmal atrial fibrillation) (HCC) -Rate controlled and is stable patient receiving outpatient  anticoagulation with warfarin.   -Not on any rate limiting drugs. -Currently holding warfarin in the setting of supratherapeutic INR.   Chronic diastolic CHF (congestive heart failure) (HCC) -Stable and compensated.   -Last echo 02/2023 EF of 65 to 70%, estimated RV systolic pressure of 52. -Continue holding diuretics at the moment -Patient advised to maintain adequate oral hydration. -Follow daily weights, volume status and clinical response.   Moderate to severe pulmonary hypertension (HCC) -Continue tadalafil -Continue outpatient follow-up with cardiology/pulmonology service.   Diabetes mellitus without complication (HCC) -A1c 6.2 -Continue to follow CBG fluctuation and further adjust hypoglycemic regimen as required. -Continue sliding scale insulin.   Mechanical heart valve present -On chronic anticoagulation with warfarin.   -Currently with supratherapeutic INR -Continue following trend and maintain warfarin level stability.   Essential hypertension -Overall stable.   -Continue to follow vital signs and further adjust antihypertensive agents as required.   Metabolic acidosis -In the setting of worsening renal failure and chronic kidney disease -Sodium bicarbonate twice a day will be initiated -Continue to follow electrolytes.     DVT prophylaxis:Warfarin per pharmacy Code Status: DNR Family Communication: None  at bedside Disposition Plan: Continue close monitoring Status is: Inpatient Remains inpatient appropriate because: Need for close monitoring  Consultants:  None  Procedures:  None  Antimicrobials:  Anti-infectives (From admission, onward)    Start     Dose/Rate Route Frequency Ordered Stop   08/07/23 0400  ceFEPIme (MAXIPIME) 1 g in sodium chloride 0.9 % 100 mL IVPB        1 g 200 mL/hr over 30 Minutes Intravenous Every 24 hours 08/06/23 0530     08/06/23 0030  ceFEPIme (MAXIPIME) 2 g in sodium chloride 0.9 % 100 mL IVPB        2 g 200 mL/hr over 30 Minutes Intravenous  Once 08/06/23 0028 08/06/23 0201   08/05/23 1845  cefTRIAXone (ROCEPHIN) 2 g in sodium chloride 0.9 % 100 mL IVPB        2 g 200 mL/hr over 30 Minutes Intravenous  Once 08/05/23 1841 08/05/23 2010       Subjective: Patient seen and evaluated today with no new acute complaints or concerns. No acute concerns or events noted overnight.  Objective: Vitals:   08/07/23 2015 08/07/23 2217 08/07/23 2220 08/08/23 0406  BP: (!) 147/59 (!) 147/59 (!) 147/59 (!) 181/64  Pulse: 92   67  Resp: 18   18  Temp: 97.8 F (36.6 C)   98 F (36.7 C)  TempSrc: Oral   Oral  SpO2: 98%     Weight:      Height:        Intake/Output Summary (Last 24 hours) at 08/08/2023 0704 Last data filed at 08/07/2023 1547 Gross per 24 hour  Intake 382.5 ml  Output --  Net 382.5 ml   Filed Weights   08/05/23 1628 08/06/23 0211  Weight: 48.9 kg 47.3 kg    Examination:  General exam: Appears calm and comfortable  Respiratory system: Clear to auscultation. Respiratory effort normal. Cardiovascular system: S1 & S2 heard, RRR.  Gastrointestinal system: Abdomen is soft Central nervous system: Alert and awake Extremities: No edema Skin: No significant lesions noted Psychiatry: Flat affect.    Data Reviewed: I have personally reviewed following labs and imaging studies  CBC: Recent Labs  Lab 08/02/23 1128 08/05/23 1649  08/06/23 0439 08/07/23 0424  WBC 6.1 13.1* 11.8* 8.8  NEUTROABS 4.1 10.7*  --   --   HGB 6.9* 7.7* 7.1* 6.6*  HCT 21.5* 23.7* 22.8* 21.9*  MCV 96.0 97.5 98.7 101.9*  PLT 101* 120* 124* 144*   Basic Metabolic Panel: Recent Labs  Lab 08/02/23 1128 08/05/23 1649 08/06/23 0439 08/07/23 0424 08/08/23 0432  NA 133* 132* 136 137 138  K 5.5* 4.6 4.7 4.5 4.6  CL 102 103 107 111 111  CO2 21* 18* 15* 16* 17*  GLUCOSE 213* 161* 164* 139* 118*  BUN 132* 117* 107* 83* 66*  CREATININE 3.37* 3.49* 3.19* 2.46* 2.12*  CALCIUM 8.7* 8.7* 8.4* 8.8* 9.3   GFR: Estimated Creatinine Clearance: 12.7 mL/min (A) (by C-G formula based on SCr of 2.12 mg/dL (H)). Liver Function Tests: Recent Labs  Lab 08/02/23 1128 08/05/23 1649  AST 22 32  ALT 12 16  ALKPHOS 82 97  BILITOT 0.4 0.7  PROT 5.8* 5.8*  ALBUMIN 3.6 3.3*   No results for input(s): "LIPASE", "AMYLASE" in the last 168 hours. No results for input(s): "AMMONIA" in the last 168 hours. Coagulation Profile: Recent Labs  Lab 08/05/23 1649 08/06/23 0439 08/07/23 0424 08/08/23 0432  INR 8.0* 5.8* 1.4* 1.1   Cardiac Enzymes: No results for input(s): "CKTOTAL", "CKMB", "CKMBINDEX", "TROPONINI" in the last 168 hours. BNP (last 3 results) No  results for input(s): "PROBNP" in the last 8760 hours. HbA1C: Recent Labs    08/05/23 1649  HGBA1C 6.2*   CBG: Recent Labs  Lab 08/06/23 2107 08/07/23 0702 08/07/23 1152 08/07/23 1607 08/07/23 2020  GLUCAP 166* 130* 158* 152* 140*   Lipid Profile: No results for input(s): "CHOL", "HDL", "LDLCALC", "TRIG", "CHOLHDL", "LDLDIRECT" in the last 72 hours. Thyroid Function Tests: No results for input(s): "TSH", "T4TOTAL", "FREET4", "T3FREE", "THYROIDAB" in the last 72 hours. Anemia Panel: No results for input(s): "VITAMINB12", "FOLATE", "FERRITIN", "TIBC", "IRON", "RETICCTPCT" in the last 72 hours. Sepsis Labs: No results for input(s): "PROCALCITON", "LATICACIDVEN" in the last 168  hours.  Recent Results (from the past 240 hour(s))  Urine Culture     Status: Abnormal (Preliminary result)   Collection Time: 08/05/23  6:59 PM   Specimen: Urine, Random  Result Value Ref Range Status   Specimen Description   Final    URINE, RANDOM Performed at [facility], [address] [place], [place] [address]    Special Requests   Final    NONE Reflexed from [id number] Performed at [facility], [address] [place], [place] [address]    Culture (A)  Final    >=100,000 COLONIES/mL KLEBSIELLA OXYTOCA CULTURE REINCUBATED FOR BETTER GROWTH Performed at [facility], [address], [place], [place] [address]    Report Status PENDING  Incomplete         Radiology Studies: No results found.      Scheduled Meds:  budesonide  0.5 mg Nebulization BID   cloNIDine  0.2 mg Oral BID   hydrALAZINE  100 mg Oral TID   insulin aspart  0-5 Units Subcutaneous QHS   insulin aspart  0-9 Units Subcutaneous TID WC   sodium bicarbonate  650 mg Oral BID   Continuous Infusions:  ceFEPime (MAXIPIME) IV 1 g (08/08/23 0440)     LOS: 3 days    Time spent: 35 minutes    Darlyn Repsher Hoover Brunette, DO [facility]  If 7PM-7AM, please contact night-coverage [email or web address] 08/08/2023, 7:04 AM

## 2023-08-09 DIAGNOSIS — E872 Acidosis, unspecified: Secondary | ICD-10-CM | POA: Diagnosis not present

## 2023-08-09 DIAGNOSIS — I272 Pulmonary hypertension, unspecified: Secondary | ICD-10-CM | POA: Diagnosis not present

## 2023-08-09 DIAGNOSIS — Z66 Do not resuscitate: Secondary | ICD-10-CM | POA: Diagnosis not present

## 2023-08-09 DIAGNOSIS — N3080 Other cystitis without hematuria: Secondary | ICD-10-CM | POA: Diagnosis not present

## 2023-08-09 LAB — BASIC METABOLIC PANEL
Anion gap: 9 (ref 5–15)
BUN: 58 mg/dL — ABNORMAL HIGH (ref 8–23)
CO2: 17 mmol/L — ABNORMAL LOW (ref 22–32)
Calcium: 8.7 mg/dL — ABNORMAL LOW (ref 8.9–10.3)
Chloride: 112 mmol/L — ABNORMAL HIGH (ref 98–111)
Creatinine, Ser: 1.82 mg/dL — ABNORMAL HIGH (ref 0.44–1.00)
GFR, Estimated: 27 mL/min — ABNORMAL LOW (ref >60)
Glucose, Bld: 122 mg/dL — ABNORMAL HIGH (ref 70–99)
Potassium: 4.2 mmol/L (ref 3.5–5.1)
Sodium: 138 mmol/L (ref 135–145)

## 2023-08-09 LAB — PROTIME-INR
INR: 1 (ref 0.8–1.2)
Prothrombin Time: 13.6 s (ref 11.4–15.2)

## 2023-08-09 LAB — GLUCOSE, CAPILLARY
Glucose-Capillary: 119 mg/dL — ABNORMAL HIGH (ref 70–99)
Glucose-Capillary: 153 mg/dL — ABNORMAL HIGH (ref 70–99)

## 2023-08-09 LAB — CBC
HCT: 25.7 % — ABNORMAL LOW (ref 36.0–46.0)
Hemoglobin: 8 g/dL — ABNORMAL LOW (ref 12.0–15.0)
MCH: 31.4 pg (ref 26.0–34.0)
MCHC: 31.1 g/dL (ref 30.0–36.0)
MCV: 100.8 fL — ABNORMAL HIGH (ref 80.0–100.0)
Platelets: 143 10*3/uL — ABNORMAL LOW (ref 150–400)
RBC: 2.55 MIL/uL — ABNORMAL LOW (ref 3.87–5.11)
RDW: 18.1 % — ABNORMAL HIGH (ref 11.5–15.5)
WBC: 5.5 10*3/uL (ref 4.0–10.5)
nRBC: 0 % (ref 0.0–0.2)

## 2023-08-09 LAB — URINE CULTURE: Culture: >=100000 — AB

## 2023-08-09 LAB — MAGNESIUM: Magnesium: 2.1 mg/dL (ref 1.7–2.4)

## 2023-08-09 MED ORDER — ENOXAPARIN SODIUM 60 MG/0.6ML IJ SOSY
50.0000 mg | PREFILLED_SYRINGE | INTRAMUSCULAR | Status: DC
  Administered 2023-08-09: 50 mg via SUBCUTANEOUS
  Filled 2023-08-09: qty 0.6

## 2023-08-09 MED ORDER — BUDESONIDE 0.5 MG/2ML IN SUSP: 0.5000 mg | Freq: Two times a day (BID) | RESPIRATORY_TRACT | 1 refills | Status: DC

## 2023-08-09 MED ORDER — FUROSEMIDE 40 MG PO TABS
60.0000 mg | ORAL_TABLET | Freq: Two times a day (BID) | ORAL | Status: DC
  Administered 2023-08-09: 60 mg via ORAL
  Filled 2023-08-09: qty 1

## 2023-08-09 MED ORDER — IPRATROPIUM-ALBUTEROL 0.5-2.5 (3) MG/3ML IN SOLN: 3.0000 mL | Freq: Four times a day (QID) | RESPIRATORY_TRACT | 0 refills | Status: DC | PRN

## 2023-08-09 MED ORDER — WARFARIN SODIUM 5 MG PO TABS
10.0000 mg | ORAL_TABLET | Freq: Once | ORAL | Status: AC
  Administered 2023-08-09: 10 mg via ORAL
  Filled 2023-08-09: qty 2

## 2023-08-09 MED ORDER — SERTRALINE HCL 50 MG PO TABS: 25.0000 mg | ORAL_TABLET | Freq: Every day | ORAL | Status: DC

## 2023-08-09 MED ORDER — SPIRONOLACTONE 25 MG PO TABS
25.0000 mg | ORAL_TABLET | Freq: Every day | ORAL | Status: DC
  Administered 2023-08-09: 25 mg via ORAL
  Filled 2023-08-09: qty 1

## 2023-08-09 MED ORDER — AMLODIPINE BESYLATE 5 MG PO TABS
10.0000 mg | ORAL_TABLET | Freq: Every day | ORAL | Status: DC
  Administered 2023-08-09: 10 mg via ORAL
  Filled 2023-08-09: qty 2

## 2023-08-09 MED ORDER — GENTAMICIN SULFATE 40 MG/ML IJ SOLN
5.0000 mg/kg | Freq: Once | INTRAVENOUS | Status: AC
  Administered 2023-08-09: 240 mg via INTRAVENOUS
  Filled 2023-08-09: qty 6

## 2023-08-09 MED ORDER — SODIUM BICARBONATE 650 MG PO TABS: 650.0000 mg | ORAL_TABLET | Freq: Two times a day (BID) | ORAL | 0 refills | Status: AC

## 2023-08-09 MED ORDER — ENOXAPARIN SODIUM 60 MG/0.6ML IJ SOSY: 50.0000 mg | PREFILLED_SYRINGE | INTRAMUSCULAR | 0 refills | Status: DC

## 2023-08-09 NOTE — Care Management Important Message (Signed)
Important Message  Patient Details  Name: [name] MRN: [id number] Date of Birth: 08-Feb-1941   Important Message Given:  Yes - Medicare IM     Corey Harold 08/09/2023, 3:31 PM

## 2023-08-09 NOTE — Discharge Summary (Signed)
Physician Discharge Summary  [name]:[id number] DOB: 1941-07-27 DOA: 08/05/2023  PCP: Gaspar Garbe, MD  Admit date: 08/05/2023  Discharge date: 08/09/2023  Admitted From:Home  Disposition:  Home  Recommendations for Outpatient Follow-up:  Follow up with PCP in 1-2 weeks Continue Coumadin as previously prescribed and follow-up INR outpatient with goal INR 2.5-3.5 and continue Lovenox bridging as prescribed No further need for antibiotics with a dose of gentamicin given prior to discharge Continue other home medications as prior  Home Health:None  Equipment/Devices:None  Discharge Condition:Stable  CODE STATUS: DNR  Diet recommendation: Heart Healthy  Brief/Interim Summary: [name] is a 82 y.o. female with medical history significant for multiple medical condition including emphysematous cystitis, CKD 4, diastolic CHF, AIHA, mechanical aortic valve, hypertension and pulmonary hypertension, atrial fibrillation, GI bleed, peripheral artery disease, diabetes mellitus. Patient presented to the ED with complaints of nausea vomiting and lower abdominal pain.  She was admitted for emphysematous cystitis that is related to Klebsiella.  She was also noted to have supratherapeutic INR which improved with administration of oral vitamin K, however she remains subtherapeutic at this time and requires bridging with Lovenox given her history of mechanical aortic valve.  She will need close follow-up with INR clinic outpatient.  Her Klebsiella sensitivities returned with ESBL and she will be given 1 dose of IV gentamicin which should address the UTI adequately.  Her AKI has also resolved and her blood pressures have normalized with the resumption of her usual home blood pressure agents.  She will need to continue to follow-up with her outpatient specialists including hematology and cardiology as otherwise scheduled and her anemia at this point remained stable after 1 unit PRBC  transfusion.  No other acute events or concerns noted and she is currently in stable condition for discharge.  Discharge Diagnoses:  Principal Problem:   Emphysematous cystitis Active Problems:   Supratherapeutic INR   Chronic diastolic CHF (congestive heart failure) (HCC)   PAF (paroxysmal atrial fibrillation) (HCC)   CKD (chronic kidney disease), stage IV (HCC)   Essential hypertension   Mechanical heart valve present   Diabetes mellitus without complication (HCC)   Moderate to severe pulmonary hypertension (HCC)  Principal discharge diagnosis: Emphysematous cystitis secondary to Klebsiella ESBL along with AKI on CKD stage IV.  Supratherapeutic INR and chronic anemia with requirement for PRBC transfusion.  Discharge Instructions  Discharge Instructions     Diet - low sodium heart healthy   Complete by: As directed    Increase activity slowly   Complete by: As directed       Allergies as of 08/09/2023       Reactions   Amlodipine Swelling   Amoxicillin-pot Clavulanate Diarrhea, Nausea And Vomiting, Other (See Comments)   Carvedilol Other (See Comments)   Terrible cramping in the feet and had a lot of bowel movements, but not diarrhea   Chlorthalidone Other (See Comments)   Ciprofloxacin Diarrhea, Nausea Only, Other (See Comments)   Stomach ache   Codeine Other (See Comments)   Insomnia, anxiety   Escitalopram Other (See Comments)   Other Reaction(s): made head feel funny   Ezetimibe Other (See Comments)   Reaction not recalled   Fluconazole Diarrhea, Other (See Comments)   Immune Globulin (human) Other (See Comments)   Severe/excruciating pain   Levofloxacin In D5w    Vomiting, diarrhea, insomnia   Losartan Other (See Comments), Swelling   Patient doesn't recall site of swelling   Lovastatin  Multidetector CT imaging of the abdomen and pelvis was performed following the standard protocol without IV contrast. RADIATION DOSE REDUCTION: This exam was performed according to the departmental dose-optimization program which includes automated exposure control, adjustment of the mA and/or kV according to patient size and/or use of iterative reconstruction technique. COMPARISON:  CT scan 01/13/2023 FINDINGS: Lower chest: Stable mild cardiac enlargement and advanced vascular calcifications. No pericardial effusion. Small hiatal hernia. No acute pulmonary findings. Stable basilar scarring changes and large calcified granuloma in the lingula. Hepatobiliary: No hepatic lesions are identified without contrast. The gallbladder is surgically absent. No biliary dilatation. Pancreas: Stable moderate pancreatic atrophy but no mass or inflammation. Spleen: Normal size. Scattered calcified splenic granulomas and advanced splenic artery calcifications. Adrenals/Urinary Tract: The adrenal glands are unremarkable. Extensive renal vascular calcifications. No renal mass lesions or hydronephrosis. There is a large amount of gas in the bladder possibly related to recent instrumentation or catheterization. There is also gas in the non dependent wall of the bladder suggesting emphysematous cystitis. Recommend correlation with urinalysis. Stomach/Bowel: Severe sigmoid colon diverticulosis but no findings for acute diverticulitis. No findings for bowel obstruction or  free air. Duodenal diverticuli noted. Vascular/Lymphatic: Severe vascular disease appears stable. No aneurysm. No adenopathy. Reproductive: Surgically absent. Other: Pelvic floor relaxation findings with rectovaginocystocele noted. Musculoskeletal: Stable degenerative changes involving the spine and hips. Remote L4 compression fracture. IMPRESSION: 1. Large amount of gas in the bladder possibly related to recent instrumentation or catheterization. There is also gas in the non dependent wall of the bladder suggesting emphysematous cystitis. Recommend correlation with urinalysis. 2. Severe sigmoid colon diverticulosis but no findings for acute diverticulitis. 3. No findings for bowel obstruction or free air. 4. Pelvic floor relaxation findings with rectovaginocystocele noted. 5. Severe vascular disease. Emphysema (ICD10-J43.9). Electronically Signed   By: Rudie Meyer M.D.   On: 08/05/2023 17:49   VAS [place] ABI WITH/WO TBI  Result Date: 08/01/2023  LOWER EXTREMITY DOPPLER STUDY Patient Name:  [name]  Date of Exam:   08/01/2023 Medical Rec #: [id number]       Accession #:    [id number] Date of Birth: [birth year]       Patient Gender: F Patient Age:   82 years Exam Location:  [facility] Procedure:      VAS [place] ABI WITH/WO TBI Referring Phys: JESSICA MILFORD --------------------------------------------------------------------------------  Indications: Peripheral artery disease. EDEMA High Risk Factors: Hypertension, hyperlipidemia, Diabetes, past history of                    smoking, coronary artery disease. Other Factors: Right leg hurts all the time. Left leg feels numb and tingling                off/on.  Comparison Study: Prior study: 05/19/2022 Right ABI: Non-compressible-Medial                   calcinosis/Right toe-0.87                   Left ABI: Non-compressible-Medial calcinosis/Left toe: 0.80 Performing Technologist: Dominica Severin RVS, RCS  Examination Guidelines: A complete evaluation includes at minimum,  Doppler waveform signals and systolic blood pressure reading at the level of bilateral brachial, anterior tibial, and posterior tibial arteries, when vessel segments are accessible. Bilateral testing is considered an integral part of a complete examination. Photoelectric Plethysmograph (PPG) waveforms and toe systolic pressure readings are included as required and additional duplex testing as needed. Limited examinations for reoccurring indications  Multidetector CT imaging of the abdomen and pelvis was performed following the standard protocol without IV contrast. RADIATION DOSE REDUCTION: This exam was performed according to the departmental dose-optimization program which includes automated exposure control, adjustment of the mA and/or kV according to patient size and/or use of iterative reconstruction technique. COMPARISON:  CT scan 01/13/2023 FINDINGS: Lower chest: Stable mild cardiac enlargement and advanced vascular calcifications. No pericardial effusion. Small hiatal hernia. No acute pulmonary findings. Stable basilar scarring changes and large calcified granuloma in the lingula. Hepatobiliary: No hepatic lesions are identified without contrast. The gallbladder is surgically absent. No biliary dilatation. Pancreas: Stable moderate pancreatic atrophy but no mass or inflammation. Spleen: Normal size. Scattered calcified splenic granulomas and advanced splenic artery calcifications. Adrenals/Urinary Tract: The adrenal glands are unremarkable. Extensive renal vascular calcifications. No renal mass lesions or hydronephrosis. There is a large amount of gas in the bladder possibly related to recent instrumentation or catheterization. There is also gas in the non dependent wall of the bladder suggesting emphysematous cystitis. Recommend correlation with urinalysis. Stomach/Bowel: Severe sigmoid colon diverticulosis but no findings for acute diverticulitis. No findings for bowel obstruction or  free air. Duodenal diverticuli noted. Vascular/Lymphatic: Severe vascular disease appears stable. No aneurysm. No adenopathy. Reproductive: Surgically absent. Other: Pelvic floor relaxation findings with rectovaginocystocele noted. Musculoskeletal: Stable degenerative changes involving the spine and hips. Remote L4 compression fracture. IMPRESSION: 1. Large amount of gas in the bladder possibly related to recent instrumentation or catheterization. There is also gas in the non dependent wall of the bladder suggesting emphysematous cystitis. Recommend correlation with urinalysis. 2. Severe sigmoid colon diverticulosis but no findings for acute diverticulitis. 3. No findings for bowel obstruction or free air. 4. Pelvic floor relaxation findings with rectovaginocystocele noted. 5. Severe vascular disease. Emphysema (ICD10-J43.9). Electronically Signed   By: Rudie Meyer M.D.   On: 08/05/2023 17:49   VAS [place] ABI WITH/WO TBI  Result Date: 08/01/2023  LOWER EXTREMITY DOPPLER STUDY Patient Name:  [name]  Date of Exam:   08/01/2023 Medical Rec #: [id number]       Accession #:    [id number] Date of Birth: [birth year]       Patient Gender: F Patient Age:   82 years Exam Location:  [facility] Procedure:      VAS [place] ABI WITH/WO TBI Referring Phys: JESSICA MILFORD --------------------------------------------------------------------------------  Indications: Peripheral artery disease. EDEMA High Risk Factors: Hypertension, hyperlipidemia, Diabetes, past history of                    smoking, coronary artery disease. Other Factors: Right leg hurts all the time. Left leg feels numb and tingling                off/on.  Comparison Study: Prior study: 05/19/2022 Right ABI: Non-compressible-Medial                   calcinosis/Right toe-0.87                   Left ABI: Non-compressible-Medial calcinosis/Left toe: 0.80 Performing Technologist: Dominica Severin RVS, RCS  Examination Guidelines: A complete evaluation includes at minimum,  Doppler waveform signals and systolic blood pressure reading at the level of bilateral brachial, anterior tibial, and posterior tibial arteries, when vessel segments are accessible. Bilateral testing is considered an integral part of a complete examination. Photoelectric Plethysmograph (PPG) waveforms and toe systolic pressure readings are included as required and additional duplex testing as needed. Limited examinations for reoccurring indications  Multidetector CT imaging of the abdomen and pelvis was performed following the standard protocol without IV contrast. RADIATION DOSE REDUCTION: This exam was performed according to the departmental dose-optimization program which includes automated exposure control, adjustment of the mA and/or kV according to patient size and/or use of iterative reconstruction technique. COMPARISON:  CT scan 01/13/2023 FINDINGS: Lower chest: Stable mild cardiac enlargement and advanced vascular calcifications. No pericardial effusion. Small hiatal hernia. No acute pulmonary findings. Stable basilar scarring changes and large calcified granuloma in the lingula. Hepatobiliary: No hepatic lesions are identified without contrast. The gallbladder is surgically absent. No biliary dilatation. Pancreas: Stable moderate pancreatic atrophy but no mass or inflammation. Spleen: Normal size. Scattered calcified splenic granulomas and advanced splenic artery calcifications. Adrenals/Urinary Tract: The adrenal glands are unremarkable. Extensive renal vascular calcifications. No renal mass lesions or hydronephrosis. There is a large amount of gas in the bladder possibly related to recent instrumentation or catheterization. There is also gas in the non dependent wall of the bladder suggesting emphysematous cystitis. Recommend correlation with urinalysis. Stomach/Bowel: Severe sigmoid colon diverticulosis but no findings for acute diverticulitis. No findings for bowel obstruction or  free air. Duodenal diverticuli noted. Vascular/Lymphatic: Severe vascular disease appears stable. No aneurysm. No adenopathy. Reproductive: Surgically absent. Other: Pelvic floor relaxation findings with rectovaginocystocele noted. Musculoskeletal: Stable degenerative changes involving the spine and hips. Remote L4 compression fracture. IMPRESSION: 1. Large amount of gas in the bladder possibly related to recent instrumentation or catheterization. There is also gas in the non dependent wall of the bladder suggesting emphysematous cystitis. Recommend correlation with urinalysis. 2. Severe sigmoid colon diverticulosis but no findings for acute diverticulitis. 3. No findings for bowel obstruction or free air. 4. Pelvic floor relaxation findings with rectovaginocystocele noted. 5. Severe vascular disease. Emphysema (ICD10-J43.9). Electronically Signed   By: Rudie Meyer M.D.   On: 08/05/2023 17:49   VAS [place] ABI WITH/WO TBI  Result Date: 08/01/2023  LOWER EXTREMITY DOPPLER STUDY Patient Name:  [name]  Date of Exam:   08/01/2023 Medical Rec #: [id number]       Accession #:    [id number] Date of Birth: [birth year]       Patient Gender: F Patient Age:   82 years Exam Location:  [facility] Procedure:      VAS [place] ABI WITH/WO TBI Referring Phys: JESSICA MILFORD --------------------------------------------------------------------------------  Indications: Peripheral artery disease. EDEMA High Risk Factors: Hypertension, hyperlipidemia, Diabetes, past history of                    smoking, coronary artery disease. Other Factors: Right leg hurts all the time. Left leg feels numb and tingling                off/on.  Comparison Study: Prior study: 05/19/2022 Right ABI: Non-compressible-Medial                   calcinosis/Right toe-0.87                   Left ABI: Non-compressible-Medial calcinosis/Left toe: 0.80 Performing Technologist: Dominica Severin RVS, RCS  Examination Guidelines: A complete evaluation includes at minimum,  Doppler waveform signals and systolic blood pressure reading at the level of bilateral brachial, anterior tibial, and posterior tibial arteries, when vessel segments are accessible. Bilateral testing is considered an integral part of a complete examination. Photoelectric Plethysmograph (PPG) waveforms and toe systolic pressure readings are included as required and additional duplex testing as needed. Limited examinations for reoccurring indications  Multidetector CT imaging of the abdomen and pelvis was performed following the standard protocol without IV contrast. RADIATION DOSE REDUCTION: This exam was performed according to the departmental dose-optimization program which includes automated exposure control, adjustment of the mA and/or kV according to patient size and/or use of iterative reconstruction technique. COMPARISON:  CT scan 01/13/2023 FINDINGS: Lower chest: Stable mild cardiac enlargement and advanced vascular calcifications. No pericardial effusion. Small hiatal hernia. No acute pulmonary findings. Stable basilar scarring changes and large calcified granuloma in the lingula. Hepatobiliary: No hepatic lesions are identified without contrast. The gallbladder is surgically absent. No biliary dilatation. Pancreas: Stable moderate pancreatic atrophy but no mass or inflammation. Spleen: Normal size. Scattered calcified splenic granulomas and advanced splenic artery calcifications. Adrenals/Urinary Tract: The adrenal glands are unremarkable. Extensive renal vascular calcifications. No renal mass lesions or hydronephrosis. There is a large amount of gas in the bladder possibly related to recent instrumentation or catheterization. There is also gas in the non dependent wall of the bladder suggesting emphysematous cystitis. Recommend correlation with urinalysis. Stomach/Bowel: Severe sigmoid colon diverticulosis but no findings for acute diverticulitis. No findings for bowel obstruction or  free air. Duodenal diverticuli noted. Vascular/Lymphatic: Severe vascular disease appears stable. No aneurysm. No adenopathy. Reproductive: Surgically absent. Other: Pelvic floor relaxation findings with rectovaginocystocele noted. Musculoskeletal: Stable degenerative changes involving the spine and hips. Remote L4 compression fracture. IMPRESSION: 1. Large amount of gas in the bladder possibly related to recent instrumentation or catheterization. There is also gas in the non dependent wall of the bladder suggesting emphysematous cystitis. Recommend correlation with urinalysis. 2. Severe sigmoid colon diverticulosis but no findings for acute diverticulitis. 3. No findings for bowel obstruction or free air. 4. Pelvic floor relaxation findings with rectovaginocystocele noted. 5. Severe vascular disease. Emphysema (ICD10-J43.9). Electronically Signed   By: Rudie Meyer M.D.   On: 08/05/2023 17:49   VAS [place] ABI WITH/WO TBI  Result Date: 08/01/2023  LOWER EXTREMITY DOPPLER STUDY Patient Name:  [name]  Date of Exam:   08/01/2023 Medical Rec #: [id number]       Accession #:    [id number] Date of Birth: [birth year]       Patient Gender: F Patient Age:   82 years Exam Location:  [facility] Procedure:      VAS [place] ABI WITH/WO TBI Referring Phys: JESSICA MILFORD --------------------------------------------------------------------------------  Indications: Peripheral artery disease. EDEMA High Risk Factors: Hypertension, hyperlipidemia, Diabetes, past history of                    smoking, coronary artery disease. Other Factors: Right leg hurts all the time. Left leg feels numb and tingling                off/on.  Comparison Study: Prior study: 05/19/2022 Right ABI: Non-compressible-Medial                   calcinosis/Right toe-0.87                   Left ABI: Non-compressible-Medial calcinosis/Left toe: 0.80 Performing Technologist: Dominica Severin RVS, RCS  Examination Guidelines: A complete evaluation includes at minimum,  Doppler waveform signals and systolic blood pressure reading at the level of bilateral brachial, anterior tibial, and posterior tibial arteries, when vessel segments are accessible. Bilateral testing is considered an integral part of a complete examination. Photoelectric Plethysmograph (PPG) waveforms and toe systolic pressure readings are included as required and additional duplex testing as needed. Limited examinations for reoccurring indications  Multidetector CT imaging of the abdomen and pelvis was performed following the standard protocol without IV contrast. RADIATION DOSE REDUCTION: This exam was performed according to the departmental dose-optimization program which includes automated exposure control, adjustment of the mA and/or kV according to patient size and/or use of iterative reconstruction technique. COMPARISON:  CT scan 01/13/2023 FINDINGS: Lower chest: Stable mild cardiac enlargement and advanced vascular calcifications. No pericardial effusion. Small hiatal hernia. No acute pulmonary findings. Stable basilar scarring changes and large calcified granuloma in the lingula. Hepatobiliary: No hepatic lesions are identified without contrast. The gallbladder is surgically absent. No biliary dilatation. Pancreas: Stable moderate pancreatic atrophy but no mass or inflammation. Spleen: Normal size. Scattered calcified splenic granulomas and advanced splenic artery calcifications. Adrenals/Urinary Tract: The adrenal glands are unremarkable. Extensive renal vascular calcifications. No renal mass lesions or hydronephrosis. There is a large amount of gas in the bladder possibly related to recent instrumentation or catheterization. There is also gas in the non dependent wall of the bladder suggesting emphysematous cystitis. Recommend correlation with urinalysis. Stomach/Bowel: Severe sigmoid colon diverticulosis but no findings for acute diverticulitis. No findings for bowel obstruction or  free air. Duodenal diverticuli noted. Vascular/Lymphatic: Severe vascular disease appears stable. No aneurysm. No adenopathy. Reproductive: Surgically absent. Other: Pelvic floor relaxation findings with rectovaginocystocele noted. Musculoskeletal: Stable degenerative changes involving the spine and hips. Remote L4 compression fracture. IMPRESSION: 1. Large amount of gas in the bladder possibly related to recent instrumentation or catheterization. There is also gas in the non dependent wall of the bladder suggesting emphysematous cystitis. Recommend correlation with urinalysis. 2. Severe sigmoid colon diverticulosis but no findings for acute diverticulitis. 3. No findings for bowel obstruction or free air. 4. Pelvic floor relaxation findings with rectovaginocystocele noted. 5. Severe vascular disease. Emphysema (ICD10-J43.9). Electronically Signed   By: Rudie Meyer M.D.   On: 08/05/2023 17:49   VAS [place] ABI WITH/WO TBI  Result Date: 08/01/2023  LOWER EXTREMITY DOPPLER STUDY Patient Name:  [name]  Date of Exam:   08/01/2023 Medical Rec #: [id number]       Accession #:    [id number] Date of Birth: [birth year]       Patient Gender: F Patient Age:   82 years Exam Location:  [facility] Procedure:      VAS [place] ABI WITH/WO TBI Referring Phys: JESSICA MILFORD --------------------------------------------------------------------------------  Indications: Peripheral artery disease. EDEMA High Risk Factors: Hypertension, hyperlipidemia, Diabetes, past history of                    smoking, coronary artery disease. Other Factors: Right leg hurts all the time. Left leg feels numb and tingling                off/on.  Comparison Study: Prior study: 05/19/2022 Right ABI: Non-compressible-Medial                   calcinosis/Right toe-0.87                   Left ABI: Non-compressible-Medial calcinosis/Left toe: 0.80 Performing Technologist: Dominica Severin RVS, RCS  Examination Guidelines: A complete evaluation includes at minimum,  Doppler waveform signals and systolic blood pressure reading at the level of bilateral brachial, anterior tibial, and posterior tibial arteries, when vessel segments are accessible. Bilateral testing is considered an integral part of a complete examination. Photoelectric Plethysmograph (PPG) waveforms and toe systolic pressure readings are included as required and additional duplex testing as needed. Limited examinations for reoccurring indications  Multidetector CT imaging of the abdomen and pelvis was performed following the standard protocol without IV contrast. RADIATION DOSE REDUCTION: This exam was performed according to the departmental dose-optimization program which includes automated exposure control, adjustment of the mA and/or kV according to patient size and/or use of iterative reconstruction technique. COMPARISON:  CT scan 01/13/2023 FINDINGS: Lower chest: Stable mild cardiac enlargement and advanced vascular calcifications. No pericardial effusion. Small hiatal hernia. No acute pulmonary findings. Stable basilar scarring changes and large calcified granuloma in the lingula. Hepatobiliary: No hepatic lesions are identified without contrast. The gallbladder is surgically absent. No biliary dilatation. Pancreas: Stable moderate pancreatic atrophy but no mass or inflammation. Spleen: Normal size. Scattered calcified splenic granulomas and advanced splenic artery calcifications. Adrenals/Urinary Tract: The adrenal glands are unremarkable. Extensive renal vascular calcifications. No renal mass lesions or hydronephrosis. There is a large amount of gas in the bladder possibly related to recent instrumentation or catheterization. There is also gas in the non dependent wall of the bladder suggesting emphysematous cystitis. Recommend correlation with urinalysis. Stomach/Bowel: Severe sigmoid colon diverticulosis but no findings for acute diverticulitis. No findings for bowel obstruction or  free air. Duodenal diverticuli noted. Vascular/Lymphatic: Severe vascular disease appears stable. No aneurysm. No adenopathy. Reproductive: Surgically absent. Other: Pelvic floor relaxation findings with rectovaginocystocele noted. Musculoskeletal: Stable degenerative changes involving the spine and hips. Remote L4 compression fracture. IMPRESSION: 1. Large amount of gas in the bladder possibly related to recent instrumentation or catheterization. There is also gas in the non dependent wall of the bladder suggesting emphysematous cystitis. Recommend correlation with urinalysis. 2. Severe sigmoid colon diverticulosis but no findings for acute diverticulitis. 3. No findings for bowel obstruction or free air. 4. Pelvic floor relaxation findings with rectovaginocystocele noted. 5. Severe vascular disease. Emphysema (ICD10-J43.9). Electronically Signed   By: Rudie Meyer M.D.   On: 08/05/2023 17:49   VAS [place] ABI WITH/WO TBI  Result Date: 08/01/2023  LOWER EXTREMITY DOPPLER STUDY Patient Name:  [name]  Date of Exam:   08/01/2023 Medical Rec #: [id number]       Accession #:    [id number] Date of Birth: [birth year]       Patient Gender: F Patient Age:   82 years Exam Location:  [place] Procedure:      VAS [place] ABI WITH/WO TBI Referring Phys: JESSICA MILFORD --------------------------------------------------------------------------------  Indications: Peripheral artery disease. EDEMA High Risk Factors: Hypertension, hyperlipidemia, Diabetes, past history of                    smoking, coronary artery disease. Other Factors: Right leg hurts all the time. Left leg feels numb and tingling                off/on.  Comparison Study: Prior study: 05/19/2022 Right ABI: Non-compressible-Medial                   calcinosis/Right toe-0.87                   Left ABI: Non-compressible-Medial calcinosis/Left toe: 0.80 Performing Technologist: Dominica Severin RVS, RCS  Examination Guidelines: A complete evaluation includes at minimum,  Doppler waveform signals and systolic blood pressure reading at the level of bilateral brachial, anterior tibial, and posterior tibial arteries, when vessel segments are accessible. Bilateral testing is considered an integral part of a complete examination. Photoelectric Plethysmograph (PPG) waveforms and toe systolic pressure readings are included as required and additional duplex testing as needed. Limited examinations for reoccurring indications

## 2023-08-09 NOTE — Progress Notes (Signed)
IV site discontinued, site WNL . Discharge instructions reviewed with patient and and patient's daughter who verbalized understanding. Patient instructed on administration of Lovenox injections.

## 2023-08-09 NOTE — Evaluation (Signed)
Physical Therapy Evaluation Patient Details Name: [name] MRN: [id number] DOB: 05/22/1941 Today's Date: 08/09/2023  History of Present Illness  [name] is a 82 y.o. female with medical history significant for multiple medical condition including emphysematous cystitis, CKD 4, diastolic CHF, AIHA, mechanical aortic valve, hypertension and pulmonary hypertension, atrial fibrillation, GI bleed, peripheral artery disease, diabetes mellitus.  Patient presented to the ED with complaints of nausea vomiting and lower abdominal pain.  Reports 1 episode of vomiting today.  No blood in vomitus, she reports a spot of blood in her urine, but no blood in stools.   Reports last week for 2 days her dose of warfarin was doubled, her INR was low at 1.4, it improved to 2.9 but dose was still adjusted.   Clinical Impression  Patient functioning near baseline for functional mobility and gait demonstrating good return for transfers and ambulating in room/hallway without loss of balance.  PLAN:  Patient to be discharged home today and discharged from acute physical therapy to care of nursing for ambulation as tolerated for length of stay with recommendations stated below          If plan is discharge home, recommend the following: Help with stairs or ramp for entrance   Can travel by private vehicle        Equipment Recommendations None recommended by PT  Recommendations for Other Services       Functional Status Assessment Patient has had a recent decline in their functional status and/or demonstrates limited ability to make significant improvements in function in a reasonable and predictable amount of time     Precautions / Restrictions Precautions Precautions: Fall Restrictions Weight Bearing Restrictions: No      Mobility  Bed Mobility Overal bed mobility: Modified Independent                  Transfers Overall transfer level: Modified independent                       Ambulation/Gait Ambulation/Gait assistance: Supervision, Modified independent (Device/Increase time) Gait Distance (Feet): 100 Feet Assistive device: Rolling walker (2 wheels) Gait Pattern/deviations: Decreased step length - right, Decreased step length - left, Decreased stride length Gait velocity: decreased     General Gait Details: slightly labored cadence with good return for ambulating in room, hallway without loss of balance  Stairs            Wheelchair Mobility     Tilt Bed    Modified Rankin (Stroke Patients Only)       Balance Overall balance assessment: Needs assistance Sitting-balance support: Feet supported, No upper extremity supported Sitting balance-Leahy Scale: Good Sitting balance - Comments: seated at EOB   Standing balance support: During functional activity, Bilateral upper extremity supported Standing balance-Leahy Scale: Fair Standing balance comment: fair/good using RW                             Pertinent Vitals/Pain Pain Assessment Pain Assessment: No/denies pain    Home Living Family/patient expects to be discharged to:: Private residence Living Arrangements: Children Available Help at Discharge: Family;Available 24 hours/day Type of Home: House Home Access: Stairs to enter Entrance Stairs-Rails: Left Entrance Stairs-Number of Steps: 3   Home Layout: One level Home Equipment: Agricultural consultant (2 wheels);Cane - single point;Shower seat;Crutches;Grab bars - toilet;Wheelchair - manual;Grab bars - tub/shower;Rollator (4 wheels)      Prior Function  Prior Level of Function : Needs assist       Physical Assist : Mobility (physical);ADLs (physical) Mobility (physical): Bed mobility;Transfers;Gait;Stairs   Mobility Comments: Household ambulator with rollator and RW ADLs Comments: Independent with ADLs, family assist with iADLs     Extremity/Trunk Assessment   Upper Extremity Assessment Upper Extremity Assessment:  Overall WFL for tasks assessed    Lower Extremity Assessment Lower Extremity Assessment: Overall WFL for tasks assessed    Cervical / Trunk Assessment Cervical / Trunk Assessment: Normal  Communication   Communication Communication: No apparent difficulties  Cognition Arousal: Alert Behavior During Therapy: WFL for tasks assessed/performed Overall Cognitive Status: Within Functional Limits for tasks assessed                                          General Comments      Exercises     Assessment/Plan    PT Assessment Patient does not need any further PT services  PT Problem List         PT Treatment Interventions      PT Goals (Current goals can be found in the Care Plan section)  Acute Rehab PT Goals Patient Stated Goal: return home with family to assist PT Goal Formulation: With patient/family Time For Goal Achievement: 08/09/23 Potential to Achieve Goals: Good    Frequency       Co-evaluation               AM-PAC PT "6 Clicks" Mobility  Outcome Measure Help needed turning from your back to your side while in a flat bed without using bedrails?: None Help needed moving from lying on your back to sitting on the side of a flat bed without using bedrails?: None Help needed moving to and from a bed to a chair (including a wheelchair)?: None Help needed standing up from a chair using your arms (e.g., wheelchair or bedside chair)?: None Help needed to walk in hospital room?: A Little Help needed climbing 3-5 steps with a railing? : A Little 6 Click Score: 22    End of Session   Activity Tolerance: Patient tolerated treatment well;Patient limited by fatigue Patient left: in bed;with call bell/phone within reach Nurse Communication: Mobility status PT Visit Diagnosis: Unsteadiness on feet (R26.81);Other abnormalities of gait and mobility (R26.89);Muscle weakness (generalized) (M62.81)    Time: 6578-4696 PT Time Calculation (min) (ACUTE  ONLY): 25 min   Charges:   PT Evaluation $PT Eval Moderate Complexity: 1 Mod PT Treatments $Therapeutic Activity: 23-37 mins PT General Charges $$ ACUTE PT VISIT: 1 Visit         2:14 PM, 08/09/23 Ocie Bob, MPT Physical Therapist with [facility] [phone] office [phone] mobile phone

## 2023-08-09 NOTE — Plan of Care (Signed)
  Problem: Skin Integrity: Goal: Risk for impaired skin integrity will decrease Outcome: Progressing   Problem: Pain Management: Goal: General experience of comfort will improve Outcome: Progressing   Problem: Safety: Goal: Ability to remain free from injury will improve Outcome: Progressing

## 2023-08-09 NOTE — Progress Notes (Addendum)
PHARMACY - ANTICOAGULATION CONSULT NOTE  Pharmacy Consult for Warfarin Indication: atrial fibrillation and mechanical aortic valve  Allergies  Allergen Reactions   Amlodipine Swelling   Amoxicillin-Pot Clavulanate Diarrhea, Nausea And Vomiting and Other (See Comments)   Carvedilol Other (See Comments)    Terrible cramping in the feet and had a lot of bowel movements, but not diarrhea   Chlorthalidone Other (See Comments)   Ciprofloxacin Diarrhea, Nausea Only and Other (See Comments)    Stomach ache   Codeine Other (See Comments)    Insomnia, anxiety   Escitalopram Other (See Comments)    Other Reaction(s): made head feel funny   Ezetimibe Other (See Comments)    Reaction not recalled   Fluconazole Diarrhea and Other (See Comments)   Immune Globulin (Human) Other (See Comments)    Severe/excruciating pain   Levofloxacin In D5w     Vomiting, diarrhea, insomnia   Losartan Other (See Comments) and Swelling    Patient doesn't recall site of swelling   Lovastatin     Other Reaction(s): do not remember   Niacin     Other Reaction(s): made her feel hot all over   Sertraline     Other Reaction(s): made head feel funny   Sulfamethoxazole-Trimethoprim Diarrhea, Nausea Only and Other (See Comments)    Stomach upset   Verapamil Hives and Other (See Comments)   Zocor [Simvastatin - High Dose] Other (See Comments)    Reaction not recalled   Gatifloxacin Other (See Comments) and Rash    Redness to skin around eye   Simvastatin Other (See Comments) and Rash    Patient Measurements: Height: 4\' 8"  (142.2 cm) Weight: 47.3 kg (104 lb 4.4 oz) IBW/kg (Calculated) : 36.3  Vital Signs: Temp: 97.8 F (36.6 C) (10/31 0529) Temp Source: Oral (10/31 0529) BP: 200/74 (10/31 0529) Pulse Rate: 79 (10/31 0529)  Labs: Recent Labs    08/07/23 0424 08/08/23 0432 08/09/23 0414  HGB 6.6* 9.0* 8.0*  HCT 21.9* 28.3* 25.7*  PLT 144* 170 143*  LABPROT 17.4* 14.2 13.6  INR 1.4* 1.1 1.0   CREATININE 2.46* 2.12* 1.82*    Estimated Creatinine Clearance: 14.8 mL/min (A) (by C-G formula based on SCr of 1.82 mg/dL (H)).   Medical History: Past Medical History:  Diagnosis Date   Aortic atherosclerosis (HCC) 01/23/2018   Atrial fibrillation (HCC)    Benign positional vertigo 10/06/2011   Carotid artery disease (HCC)    Chemotherapy induced neutropenia (HCC) 07/21/2019   Cholelithiasis 01/23/2018   Cholelithiasis 01/23/2018   Chronic anticoagulation    Colovesical fistula, ruled out 04/14/2022   Coronary artery disease    status post coronary artery bypass grafting times 07/10/2004   Diabetes mellitus    Hypercholesteremia    Hypertension    Mechanical heart valve present    H. aortic valve replacement at the time of bypass surgery October 2005   Moderate to severe pulmonary hypertension (HCC)    Peripheral arterial disease (HCC)    history of left common iliac artery PTA and stenting for a chronic total occlusion 08/26/01   S/P cholecystectomy 03/06/2018    Medications:  Medications Prior to Admission  Medication Sig Dispense Refill Last Dose   acetaminophen (TYLENOL) 500 MG tablet Take 500-1,000 mg by mouth every 6 (six) hours as needed for mild pain (pain score 1-3) or fever.   Past Week   allopurinol (ZYLOPRIM) 100 MG tablet Take 100 mg by mouth daily.   08/05/2023   ALPRAZolam (XANAX) 0.5 MG  tablet Take 0.5 mg by mouth at bedtime.   08/04/2023   amLODipine (NORVASC) 10 MG tablet Take 1 tablet (10 mg total) by mouth daily. (Patient taking differently: Take 10 mg by mouth at bedtime.) 90 tablet 3 08/04/2023   aspirin EC 81 MG tablet Take 81 mg by mouth daily. Swallow whole.   08/05/2023 at 0900   atorvastatin (LIPITOR) 80 MG tablet Take 1 tablet (80 mg total) by mouth daily at 6 PM.   08/04/2023   Cholecalciferol (VITAMIN D3) 50 MCG (2000 UT) capsule Take 2,000 Units by mouth daily with lunch.   08/05/2023   cloNIDine (CATAPRES) 0.2 MG tablet Take 0.2 mg by mouth 2  (two) times daily.   08/05/2023   Cyanocobalamin (VITAMIN B12) 1000 MCG TBCR Take 1,000 mcg by mouth daily with lunch.   08/05/2023   famotidine (PEPCID) 20 MG tablet Take 20 mg by mouth 2 (two) times daily.   08/05/2023   ferrous sulfate 325 (65 FE) MG tablet Take 1 tablet (325 mg total) by mouth daily with breakfast. (Patient taking differently: Take 650 mg by mouth daily with breakfast.)  3 08/05/2023   furosemide (LASIX) 40 MG tablet Patient take 1.5 (60 mg) tablet by mouth in the morning and 1.5 (60 mg) tablet at night. (Patient taking differently: Take 60 mg by mouth 2 (two) times daily.)   08/04/2023   hydrALAZINE (APRESOLINE) 100 MG tablet Take 1 tablet (100 mg total) by mouth 3 (three) times daily. 270 tablet 3 08/05/2023   hydrocortisone (ANUSOL-HC) 2.5 % rectal cream Place 1 Application rectally 2 (two) times daily. For 14 days. May repeat if needed. 30 g 1 Past Month   insulin aspart (NOVOLOG) 100 UNIT/ML injection Inject 2-8 Units into the skin 3 (three) times daily with meals. Per sliding scale, If blood sugar is 200 to 250, give 2 units. If blood sugar is 251 to 300, give 4 units. If blood sugar is 301 to 350, give 6 units. If blood sugar is greater than 350, give 8 units and call MD.   08/04/2023   ondansetron (ZOFRAN-ODT) 4 MG disintegrating tablet Take 4 mg by mouth every 8 (eight) hours as needed for nausea or vomiting.   08/04/2023   pantoprazole (PROTONIX) 40 MG tablet Take 1 tablet (40 mg total) by mouth 2 (two) times daily. 60 tablet 2 08/05/2023   senna-docusate (SENOKOT-S) 8.6-50 MG tablet Take 2 tablets by mouth at bedtime. (Patient taking differently: Take 2 tablets by mouth at bedtime as needed for mild constipation.) 60 tablet 2 08/04/2023   sertraline (ZOLOFT) 25 MG tablet Take 25 mg by mouth at bedtime.   08/04/2023   simethicone (GAS-X) 80 MG chewable tablet Chew 1 tablet (80 mg total) by mouth 4 (four) times daily as needed for flatulence. 100 tablet 2 08/04/2023    spironolactone (ALDACTONE) 25 MG tablet TAKE 1 TABLET EVERY DAY (Patient taking differently: Take 25 mg by mouth at bedtime.) 90 tablet 3 08/04/2023   tadalafil, PAH, (ADCIRCA) 20 MG tablet TAKE 2 TABLETS BY MOUTH ONCE DAILY (Patient taking differently: Take 40 mg by mouth at bedtime.) 60 tablet 11 08/04/2023   warfarin (COUMADIN) 5 MG tablet Take 1 tablet (5 mg total) by mouth See admin instructions. Take 1 tablet 5mg  on Tuesday/Thursday/Sunday and 1/2 Tab (2.5 mg) on MWFSat (Patient taking differently: Take 2.5-5 mg by mouth See admin instructions. Take 1 tablet by mouth at 5 pm on Tues, Thurs, and Sun, and 0.5 tablet on Mon, Wed,  Fri, and Sat.)   08/04/2023 at 1700   Insulin Syringe-Needle U-100 (B-D INS SYR ULTRAFINE .3CC/29G) 29G X 1/2" 0.3 ML MISC Use for sliding scale for breakthrough high blood sugar readings 100 each 1    nitrofurantoin (MACRODANTIN) 100 MG capsule Take 100 mg by mouth 2 (two) times daily.   past month    Assessment: 82 y.o. female with medical history significant for multiple medical condition including emphysematous cystitis, CKD 4, diastolic CHF, AIHA, mechanical aortic valve, hypertension and pulmonary hypertension, atrial fibrillation, GI bleed, peripheral artery disease, diabetes mellitus. She patient presented to the ED with complaints of nausea vomiting and lower abdominal pain.  Patient chronically anticoagulated with warfarin and had Supratherapeutic INR 8.  Hgb 7.7 at time of admission. Vitamin K(10/27 5mg / 10/28 2.5mg ) and transfusion given.  No overt bleeding appreciated. Now plan to resume the use of warfarin. Pharmacy to dose  INR 1.1> 1, subtherapeutic. Reversed with oral vit K, so may take a few days for INR to trend up. Will give 10mg  dose today Also , will bridge with lovenox until INR > 2.5  PTA dose 10mg  on Mon, Wed, and Fri, and 5mg  all other days  Goal of Therapy:  INR 2.5-3.5 Monitor platelets by anticoagulation protocol: Yes   Plan:  Coumadin 10mg   po x 1 today Lovenox 1mg /kg (50mg ) sq q24h until INR>2.5 INR daily Monitor for S/S of bleeding  Elder Cyphers, BS Pharm D, BCPS Clinical Pharmacist 08/09/2023,11:24 AM

## 2023-08-10 ENCOUNTER — Other Ambulatory Visit: Payer: Self-pay

## 2023-08-10 ENCOUNTER — Other Ambulatory Visit: Payer: Self-pay | Admitting: *Deleted

## 2023-08-10 DIAGNOSIS — D508 Other iron deficiency anemias: Secondary | ICD-10-CM

## 2023-08-10 DIAGNOSIS — I480 Paroxysmal atrial fibrillation: Secondary | ICD-10-CM

## 2023-08-10 DIAGNOSIS — D591 Autoimmune hemolytic anemia, unspecified: Secondary | ICD-10-CM

## 2023-08-10 MED ORDER — ENOXAPARIN SODIUM 60 MG/0.6ML IJ SOSY: 50.0000 mg | PREFILLED_SYRINGE | INTRAMUSCULAR | 0 refills | Status: DC

## 2023-08-10 NOTE — Telephone Encounter (Signed)
Relieved call from pt's daughter, Goshen Sink stating pt was prescribed Lovenox injections daily x 7 days post hospital admission; however, the prescription was sent to [facility] and they do not have the medication in stock. Ruston Sink requested the prescription to be sent to [facility] pharmacy in [place]. Confirmed this with Wynonia Lawman, PharmD. Refill sent.

## 2023-08-10 NOTE — Consult Note (Signed)
[facility] Liaison Note  08/10/2023  [name] 07/16/1941 [id number]  Location: RN Hospital Liaison screened the patient remotely at [facility].  Insurance: [employer]   [name] is a 82 y.o. female who is a Primary Care Patient of [name], [name], MD- [employer].The patient was screened for readmission hospitalization with noted extreme risk score for unplanned readmission risk with 1 IP in 6 months.  The patient was assessed for potential Care Management service needs for post hospital transition for care coordination. Review of patient's electronic medical record reveals patient was admitted with Cystitis. Provider's office with follow pt for post hospital discharge needs for care management services.   [facility] Care Management/Population Health does not replace or interfere with any arrangements made by the Inpatient Transition of Care team.   For questions contact:   Elliot Cousin, RN, [facility] Liaison [place]   [facility], Population Health Office Hours MTWF  8:00 am-6:00 pm Direct Dial: [phone] mobile [phone] [Office toll free line] Office Hours are M-F 8:30 - 5 pm [name]

## 2023-08-13 ENCOUNTER — Inpatient Hospital Stay: Payer: Medicare HMO | Admitting: Hematology and Oncology

## 2023-08-13 ENCOUNTER — Inpatient Hospital Stay: Payer: Medicare HMO | Attending: Oncology

## 2023-08-13 ENCOUNTER — Other Ambulatory Visit: Payer: Self-pay | Admitting: Pharmacist

## 2023-08-13 ENCOUNTER — Inpatient Hospital Stay: Payer: Medicare HMO

## 2023-08-13 VITALS — BP 127/67 | HR 90 | Temp 97.5°F | Resp 18 | Wt 115.5 lb

## 2023-08-13 DIAGNOSIS — D638 Anemia in other chronic diseases classified elsewhere: Secondary | ICD-10-CM

## 2023-08-13 DIAGNOSIS — M8580 Other specified disorders of bone density and structure, unspecified site: Secondary | ICD-10-CM | POA: Insufficient documentation

## 2023-08-13 DIAGNOSIS — D591 Autoimmune hemolytic anemia, unspecified: Secondary | ICD-10-CM | POA: Diagnosis not present

## 2023-08-13 DIAGNOSIS — N184 Chronic kidney disease, stage 4 (severe): Secondary | ICD-10-CM | POA: Insufficient documentation

## 2023-08-13 DIAGNOSIS — I7782 Antineutrophilic cytoplasmic antibody (ANCA) vasculitis: Secondary | ICD-10-CM | POA: Insufficient documentation

## 2023-08-13 DIAGNOSIS — D631 Anemia in chronic kidney disease: Secondary | ICD-10-CM | POA: Insufficient documentation

## 2023-08-13 DIAGNOSIS — D508 Other iron deficiency anemias: Secondary | ICD-10-CM

## 2023-08-13 DIAGNOSIS — Z7901 Long term (current) use of anticoagulants: Secondary | ICD-10-CM | POA: Diagnosis not present

## 2023-08-13 DIAGNOSIS — R600 Localized edema: Secondary | ICD-10-CM | POA: Diagnosis not present

## 2023-08-13 LAB — BASIC METABOLIC PANEL - CANCER CENTER ONLY
Anion gap: 7 (ref 5–15)
BUN: 72 mg/dL — ABNORMAL HIGH (ref 8–23)
CO2: 18 mmol/L — ABNORMAL LOW (ref 22–32)
Calcium: 7.8 mg/dL — ABNORMAL LOW (ref 8.9–10.3)
Chloride: 106 mmol/L (ref 98–111)
Creatinine: 2.95 mg/dL — ABNORMAL HIGH (ref 0.44–1.00)
GFR, Estimated: 15 mL/min — ABNORMAL LOW (ref >60)
Glucose, Bld: 161 mg/dL — ABNORMAL HIGH (ref 70–99)
Potassium: 4.9 mmol/L (ref 3.5–5.1)
Sodium: 131 mmol/L — ABNORMAL LOW (ref 135–145)

## 2023-08-13 LAB — CBC WITH DIFFERENTIAL (CANCER CENTER ONLY)
Abs Immature Granulocytes: 0.03 10*3/uL (ref 0.00–0.07)
Basophils Absolute: 0 10*3/uL (ref 0.0–0.1)
Basophils Relative: 1 %
Eosinophils Absolute: 0.2 10*3/uL (ref 0.0–0.5)
Eosinophils Relative: 3 %
HCT: 24 % — ABNORMAL LOW (ref 36.0–46.0)
Hemoglobin: 7.6 g/dL — ABNORMAL LOW (ref 12.0–15.0)
Immature Granulocytes: 1 %
Lymphocytes Relative: 15 %
Lymphs Abs: 1 10*3/uL (ref 0.7–4.0)
MCH: 31.4 pg (ref 26.0–34.0)
MCHC: 31.7 g/dL (ref 30.0–36.0)
MCV: 99.2 fL (ref 80.0–100.0)
Monocytes Absolute: 0.4 10*3/uL (ref 0.1–1.0)
Monocytes Relative: 7 %
Neutro Abs: 4.8 10*3/uL (ref 1.7–7.7)
Neutrophils Relative %: 73 %
Platelet Count: 124 10*3/uL — ABNORMAL LOW (ref 150–400)
RBC: 2.42 MIL/uL — ABNORMAL LOW (ref 3.87–5.11)
RDW: 18 % — ABNORMAL HIGH (ref 11.5–15.5)
WBC Count: 6.5 10*3/uL (ref 4.0–10.5)
nRBC: 0 % (ref 0.0–0.2)

## 2023-08-13 LAB — SAMPLE TO BLOOD BANK

## 2023-08-13 LAB — RETICULOCYTES
Immature Retic Fract: 19.6 % — ABNORMAL HIGH (ref 2.3–15.9)
RBC.: 2.3 MIL/uL — ABNORMAL LOW (ref 3.87–5.11)
Retic Count, Absolute: 118 10*3/uL (ref 19.0–186.0)
Retic Ct Pct: 5.1 % — ABNORMAL HIGH (ref 0.4–3.1)

## 2023-08-13 NOTE — Progress Notes (Signed)
[facility] Cancer Follow up:    [name], [name], MD [address] [place] [address]   DIAGNOSIS: Multifactorial Anemia  SUMMARY OF HEMATOLOGIC HISTORY: with multifactorial anemia, as follows:             (a) anemia of chronic inflammation: As of May 2020 she has a SED rate of 95, CRP 1.1, positive ANA and RF; subsequently she was found to be ANCA positive             (b) hemolysis (mild): she has a mildly elevated LDH and a DAT positive for complement, not IgG; this is c/w (a) above             (c) anemia of renal insufficiency: inadequate EPO resonse to anemia                         (d) GIB/ iron deficiency in patient on lifelong anticoagulaton   (1) Feraheme received 03/01/2019, repeated 03/20/2019              (a) reticulocyte 03/07/2019 up from 43.4 a year ago to 191.8 after iron infusion             (b) feraheme repeated on 05/12/2019             (c) subsequent ferritin levels >100             (D) Ferrliceit 250 mg last given 06/25/22   (2) darbepoetin: starting 05/05/2019 at given every 2 weeks             (a) dose increased to 200 mcg every week starting 07/01/2019              (b) back to every 2 weeks beginning 08/04/2019 still at 200 mcg dose             (c) switched to Retacrit every 4 weeks as of 08/18/2019   (3) ANCA positive vasculitis: diagnosed 05/08/2019 (titer 1:640)             (a) prednisone 40 mg daily started 05/31/2019             (b) rituximab weekly started 06/12/2019 (received test dose 06/05/2019)                         (i) hepatitis B sAg, core Ab and HIV negative 05/08/2019                         (ii) rituximab held after 07/14/2019 dose (received 5 weekly doses) with neutropenia                         (iii) rituximab resumed 08/18/2019 but changed to monthly                         (iv) rituximab changed to every 2 months beginning with 02/02/2020 dose                         (v) rituximab changed to every 12 weeks  after the 10/12/2020 dose             (c) prednisone tapered to off as of 09/15/2019                          (  4) Anemia worsened in spring 2023, refractory to Rituximab and requiring prednisone restart with slow taper. This was completed in March 2024 per chart review.   (5) Bone marrow biopsy 02/20/2022: normocellular marrow with trilineage hematopoiesis, CT C/A/P on the same day did not show an underlying cause for anemia.     (5) osteopenia with a T score of -1.5 on bone density 02/28/2016 at the breast center  CURRENT THERAPY: Retacrit  INTERVAL HISTORY: [name] 82 y.o. female returns for follow-up and evaluation of her multifactorial anemia.  Discussed the use of AI scribe software for clinical note transcription with the patient, who gave verbal consent to proceed.  History of Present Illness    The patient, with a history of recurrent urinary tract infections (UTIs) and anemia, presents with general malaise and fatigue. She was recently discharged from the hospital after being treated for a UTI and receiving a blood transfusion for anemia. Despite feeling well upon discharge, she has since experienced a decline in her overall health. She denies fevers today.. She also reports decreased urination, which she attributes to a new medication, sodium bicarbonate. She has developed a new rash on both legs and has multiple bruises. She denies any recent falls or injuries.  She is in a wheelchair today.  Rest of the pertinent 10 point ROS reviewed and negative  Patient Active Problem List   Diagnosis Date Noted   Chronic cough 03/26/2023   Protein-calorie malnutrition, severe 12/26/2022   Acute GI bleeding 12/24/2022   Coagulopathy (HCC) 12/24/2022   Melena 12/24/2022   Duodenitis 12/24/2022   Noninfectious gastroenteritis 12/24/2022   CAD, multiple vessel 12/24/2022   Acute on chronic heart failure with preserved ejection fraction (HFpEF) (HCC) 08/29/2022   Acute exacerbation  of CHF (congestive heart failure) (HCC) 08/29/2022   Acute urinary retention 08/28/2022   Acute kidney injury superimposed on chronic kidney disease (HCC) 08/27/2022   Constipation 08/27/2022   Nausea and vomiting 08/27/2022   Hypoalbuminemia due to protein-calorie malnutrition (HCC) 08/27/2022   Type 2 diabetes mellitus with hyperglycemia (HCC) 08/27/2022   Palliative care encounter    Goals of care, counseling/discussion    Encounter for therapeutic drug monitoring    GI bleed 07/09/2022   GI bleeding 07/08/2022   Acute on chronic anemia 07/08/2022   History of Clostridioides difficile colitis 07/08/2022   DNR (do not resuscitate)/DNI(Do Not Intubate) 07/08/2022   Anemia of chronic disease 07/07/2022   Pressure injury of skin 06/23/2022   UTI (urinary tract infection) 06/22/2022   Joint pain 06/09/2022   Parietoalveolar pneumopathy (HCC) 06/09/2022   Primary osteoarthritis 06/09/2022   Rheumatoid factor positive 06/09/2022   C. difficile colitis 04/19/2022   Colitis 04/18/2022   PAH (pulmonary artery hypertension) (HCC) 04/18/2022   Emphysematous cystitis 04/13/2022   Female bladder prolapse 04/13/2022   Diverticulitis 04/12/2022   Autoimmune hemolytic anemia (HCC) 01/02/2022   Bilateral lower extremity edema 01/02/2022   Refractory anemia (HCC) 12/15/2021   Abnormal chest x-ray 12/15/2021   Supratherapeutic INR 12/15/2021   Elevated troponin 12/15/2021   Unilateral primary osteoarthritis, right hip 05/31/2021   Midline cystocele 05/30/2021   Primary localized osteoarthritis of pelvic region and thigh 05/30/2021   Vaginal pain 05/30/2021   Asymmetric SNHL (sensorineural hearing loss) 01/06/2021   Referred otalgia of both ears 01/06/2021   Bilateral hearing loss 08/31/2020   Low back pain 02/20/2020   CKD (chronic kidney disease), stage IV (HCC) 10/13/2019   Leukopenia due to antineoplastic chemotherapy (HCC) 08/18/2019  Thrombophilia (HCC) 06/19/2019   Encounter for  general adult medical examination without abnormal findings 06/13/2019   Arteritis (HCC) 06/06/2019   Hypertensive heart and chronic kidney disease with heart failure and stage 1 through stage 4 chronic kidney disease, or unspecified chronic kidney disease (HCC) 06/06/2019   Muscle weakness 06/06/2019   Pancytopenia (HCC) 05/28/2019   Dyspnea 05/28/2019   Vasculitis, ANCA positive 05/28/2019   Disorder of connective tissue (HCC) 05/14/2019   Renal insufficiency 05/08/2019   PAF (paroxysmal atrial fibrillation) (HCC) 04/07/2019   IDA (iron deficiency anemia) 02/28/2019   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 (HCC) 02/28/2019   Diabetic retinopathy associated with type 2 diabetes mellitus (HCC) 09/02/2018   Chronic pain 06/19/2018   Non-thrombocytopenic purpura (HCC) 05/21/2018   Gout 03/22/2018   Malnutrition of moderate degree 03/12/2018   Diabetes mellitus type 2, controlled (HCC) 03/06/2018   Chronic atrial fibrillation (HCC) 03/06/2018   H/O mechanical aortic valve replacement 03/06/2018   Moderate to severe pulmonary hypertension (HCC) 03/06/2018   GERD without esophagitis 02/21/2018   Anxiety 02/21/2018   Chronic diastolic CHF (congestive heart failure) (HCC) 02/21/2018   General weakness 02/02/2018   Thrombocytopenia (HCC) 01/25/2018   Arm pain, diffuse 01/25/2018   Anemia 01/23/2018   Aortic atherosclerosis (HCC) 01/23/2018   Age-related osteoporosis without current pathological fracture 05/24/2017   Carotid artery occlusion 05/24/2017   Generalized anxiety disorder 05/24/2017   Hearing loss 05/24/2017   Presence of prosthetic heart valve 05/24/2017   Carotid artery disease (HCC) 08/18/2013   Peripheral arterial disease (HCC) 08/18/2013   Diabetes mellitus without complication (HCC) 08/18/2013   Essential hypertension    Hypercholesteremia    Mechanical heart valve present    Chronic anticoagulation     is allergic to amlodipine,  amoxicillin-pot clavulanate, carvedilol, chlorthalidone, ciprofloxacin, codeine, escitalopram, ezetimibe, fluconazole, immune globulin (human), levofloxacin in d5w, losartan, lovastatin, niacin, sertraline, sulfamethoxazole-trimethoprim, verapamil, zocor [simvastatin - high dose], gatifloxacin, and simvastatin.  MEDICAL HISTORY: Past Medical History:  Diagnosis Date   Aortic atherosclerosis (HCC) 01/23/2018   Atrial fibrillation (HCC)    Benign positional vertigo 10/06/2011   Carotid artery disease (HCC)    Chemotherapy induced neutropenia (HCC) 07/21/2019   Cholelithiasis 01/23/2018   Cholelithiasis 01/23/2018   Chronic anticoagulation    Colovesical fistula, ruled out 04/14/2022   Coronary artery disease    status post coronary artery bypass grafting times 07/10/2004   Diabetes mellitus    Hypercholesteremia    Hypertension    Mechanical heart valve present    H. aortic valve replacement at the time of bypass surgery October 2005   Moderate to severe pulmonary hypertension (HCC)    Peripheral arterial disease (HCC)    history of left common iliac artery PTA and stenting for a chronic total occlusion 08/26/01   S/P cholecystectomy 03/06/2018    SURGICAL HISTORY: Past Surgical History:  Procedure Laterality Date   anterior repair  2009   AORTIC VALVE REPLACEMENT  2005   Dr. Laneta Simmers   CARDIAC CATHETERIZATION  11/10/2004   40% right common illiac, 70% in stent restenosis of distal left common illiac,    CARDIAC CATHETERIZATION  05/18/2004   LAD 50-70% midstenosis, RCA dominant w/50% stenosis, 50% Right common Illiac artery ostial stenosis, 90% in stent restenosis within midportion of left common illiac stent   Carotid Duplex  03/12/2012   RSA-elev. velocities suggestive of a 50-69% diameter reduction, Right&Left Bulb/Prox ICA-mild-mod.fibrous plaqueelevating Velocities abnormal study.   CHOLECYSTECTOMY N/A 03/01/2018   Procedure: LAPAROSCOPIC CHOLECYSTECTOMY;  Surgeon: Jimmye Norman,  MD;  Location: [facility] OR;  Service: General;  Laterality: N/A;   COLONOSCOPY WITH PROPOFOL N/A 01/22/2018   Procedure: COLONOSCOPY WITH PROPOFOL;  Surgeon: Charlott Rakes, MD;  Location: [facility] ENDOSCOPY;  Service: Endoscopy;  Laterality: N/A;   ESOPHAGOGASTRODUODENOSCOPY N/A 07/13/2022   Procedure: ESOPHAGOGASTRODUODENOSCOPY (EGD);  Surgeon: Kerin Salen, MD;  Location: [facility] ENDOSCOPY;  Service: Gastroenterology;  Laterality: N/A;   ESOPHAGOGASTRODUODENOSCOPY (EGD) WITH PROPOFOL N/A 01/22/2018   Procedure: ESOPHAGOGASTRODUODENOSCOPY (EGD) WITH PROPOFOL;  Surgeon: Charlott Rakes, MD;  Location: [facility] ENDOSCOPY;  Service: Endoscopy;  Laterality: N/A;   ESOPHAGOGASTRODUODENOSCOPY (EGD) WITH PROPOFOL N/A 11/21/2018   Procedure: ESOPHAGOGASTRODUODENOSCOPY (EGD) WITH PROPOFOL;  Surgeon: Kerin Salen, MD;  Location: [facility] ENDOSCOPY;  Service: Gastroenterology;  Laterality: N/A;   ESOPHAGOGASTRODUODENOSCOPY (EGD) WITH PROPOFOL Left 02/27/2019   Procedure: ESOPHAGOGASTRODUODENOSCOPY (EGD) WITH PROPOFOL;  Surgeon: Willis Modena, MD;  Location: [facility] ENDOSCOPY;  Service: Endoscopy;  Laterality: Left;   GIVENS CAPSULE STUDY N/A 11/21/2018   Procedure: GIVENS CAPSULE STUDY;  Surgeon: Kerin Salen, MD;  Location: [facility] ENDOSCOPY;  Service: Gastroenterology;  Laterality: N/A;  To be deployed during EGD   Lower Ext. Duplex  03/12/2012   Right Proximal CIA- vessel narrowing w/elevated velocities 0-49% diameter reduction. Right SFA-mild mixed density plaque throughout vessel.   NM MYOCAR PERF WALL MOTION  05/19/2010   protocol: Persantine, post stress EF 65%, negative for ischemia, low risk scan   RIGHT HEART CATH N/A 06/27/2018   Procedure: RIGHT HEART CATH;  Surgeon: Laurey Morale, MD;  Location: [facility] INVASIVE CV LAB;  Service: Cardiovascular;  Laterality: N/A;   TOTAL ABDOMINAL HYSTERECTOMY W/ BILATERAL SALPINGOOPHORECTOMY  1989   TRANSTHORACIC ECHOCARDIOGRAM  08/29/2012   Moderately calcified annulus of mitral valve, moderate  regurg. of both mitral valve and tricuspid valve.     SOCIAL HISTORY: Social History   Socioeconomic History   Marital status: Widowed    Spouse name: Not on file   Number of children: Not on file   Years of education: Not on file   Highest education level: Not on file  Occupational History   Occupation: Retired  Tobacco Use   Smoking status: Former    Current packs/day: 1.00    Average packs/day: 1 pack/day for 30.0 years (30.0 ttl pk-yrs)    Types: Cigarettes   Smokeless tobacco: Never   Tobacco comments:    quit smoking 2005  Vaping Use   Vaping status: Never Used  Substance and Sexual Activity   Alcohol use: No    Alcohol/week: 0.0 standard drinks of alcohol   Drug use: No   Sexual activity: Not Currently    Birth control/protection: None  Other Topics Concern   Not on file  Social History Narrative   Not on file   Social Determinants of Health   Financial Resource Strain: Low Risk  (02/28/2022)   Overall Financial Resource Strain (CARDIA)    Difficulty of Paying Living Expenses: Not hard at all  Food Insecurity: Patient Declined (08/06/2023)   Hunger Vital Sign    Worried About Running Out of Food in the Last Year: Patient declined    Ran Out of Food in the Last Year: Patient declined  Transportation Needs: Patient Declined (08/06/2023)   PRAPARE - Administrator, Civil Service (Medical): Patient declined    Lack of Transportation (Non-Medical): Patient declined  Physical Activity: Inactive (02/28/2022)   Exercise Vital Sign    Days of Exercise per Week: 0 days    Minutes of Exercise  per Session: 0 min  Stress: No Stress Concern Present (02/28/2022)   [employer] of Occupational Health - Occupational Stress Questionnaire    Feeling of Stress : Only a little  Social Connections: Moderately Integrated (06/22/2022)   Social Connection and Isolation Panel [NHANES]    Frequency of Communication with Friends and Family: More than three times a week     Frequency of Social Gatherings with Friends and Family: More than three times a week    Attends Religious Services: More than 4 times per year    Active Member of [employer] or Organizations: Yes    Attends Banker Meetings: More than 4 times per year    Marital Status: Widowed  Intimate Partner Violence: Patient Declined (08/06/2023)   Humiliation, Afraid, Rape, and Kick questionnaire    Fear of Current or Ex-Partner: Patient declined    Emotionally Abused: Patient declined    Physically Abused: Patient declined    Sexually Abused: Patient declined    FAMILY HISTORY: Family History  Problem Relation Age of Onset   Heart disease Father    Hypertension Father    Hyperlipidemia Father    Breast cancer Neg Hx     Review of Systems  Constitutional:  Positive for fatigue. Negative for appetite change, chills, fever and unexpected weight change.  HENT:   Negative for hearing loss, lump/mass and trouble swallowing.   Eyes:  Negative for eye problems and icterus.  Respiratory:  Negative for chest tightness, cough and shortness of breath.   Cardiovascular:  Positive for leg swelling (at baseline). Negative for chest pain and palpitations.  Gastrointestinal:  Negative for abdominal distention, abdominal pain, constipation, diarrhea, nausea and vomiting.  Endocrine: Negative for hot flashes.  Genitourinary:  Negative for difficulty urinating.   Musculoskeletal:  Negative for arthralgias.  Skin:  Negative for itching and rash.  Neurological:  Negative for dizziness, extremity weakness, headaches and numbness.  Hematological:  Negative for adenopathy. Bruises/bleeds easily (easy bruising on coumadin).  Psychiatric/Behavioral:  Negative for depression. The patient is not nervous/anxious.       PHYSICAL EXAMINATION  Vitals:   08/13/23 1303  BP: 127/67  Pulse: 90  Resp: 18  Temp: (!) 97.5 F (36.4 C)  SpO2: 100%    Physical Exam Constitutional:      General: She is  not in acute distress.    Appearance: Normal appearance. She is not toxic-appearing.  HENT:     Head: Normocephalic and atraumatic.     Mouth/Throat:     Mouth: Mucous membranes are moist.     Pharynx: Oropharynx is clear. No oropharyngeal exudate or posterior oropharyngeal erythema.  Eyes:     General: No scleral icterus. Cardiovascular:     Pulses: Normal pulses.     Heart sounds: Normal heart sounds.  Pulmonary:     Effort: Pulmonary effort is normal.     Breath sounds: Normal breath sounds.  Abdominal:     General: Abdomen is flat. Bowel sounds are normal. There is no distension.     Palpations: Abdomen is soft.     Tenderness: There is no abdominal tenderness.  Musculoskeletal:        General: Swelling (1-2+ bilateral lower extremity) present.     Cervical back: Neck supple.  Lymphadenopathy:     Cervical: No cervical adenopathy.  Skin:    General: Skin is warm and dry.     Findings: Rash (Scattered ecchymosis noted on the forearms.  Rash noted on the lower extremities  as well.) present.  Neurological:     General: No focal deficit present.     Mental Status: She is alert.  Psychiatric:        Mood and Affect: Mood normal.        Behavior: Behavior normal.     LABORATORY DATA:  CBC    Component Value Date/Time   WBC 5.5 08/09/2023 0414   RBC 2.55 (L) 08/09/2023 0414   HGB 8.0 (L) 08/09/2023 0414   HGB 6.9 (LL) 08/02/2023 1128   HCT 25.7 (L) 08/09/2023 0414   HCT 28.5 (L) 11/20/2018 1824   PLT 143 (L) 08/09/2023 0414   PLT 101 (L) 08/02/2023 1128   MCV 100.8 (H) 08/09/2023 0414   MCH 31.4 08/09/2023 0414   MCHC 31.1 08/09/2023 0414   RDW 18.1 (H) 08/09/2023 0414   LYMPHSABS 1.2 08/05/2023 1649   MONOABS 1.0 08/05/2023 1649   EOSABS 0.2 08/05/2023 1649   BASOSABS 0.0 08/05/2023 1649    CMP     Component Value Date/Time   NA 138 08/09/2023 0414   NA 137 12/06/2017 1436   K 4.2 08/09/2023 0414   CL 112 (H) 08/09/2023 0414   CO2 17 (L) 08/09/2023 0414    GLUCOSE 122 (H) 08/09/2023 0414   BUN 58 (H) 08/09/2023 0414   BUN 20 12/06/2017 1436   CREATININE 1.82 (H) 08/09/2023 0414   CREATININE 3.37 (H) 08/02/2023 1128   CALCIUM 8.7 (L) 08/09/2023 0414   PROT 5.8 (L) 08/05/2023 1649   ALBUMIN 3.3 (L) 08/05/2023 1649   AST 32 08/05/2023 1649   AST 22 08/02/2023 1128   ALT 16 08/05/2023 1649   ALT 12 08/02/2023 1128   ALKPHOS 97 08/05/2023 1649   BILITOT 0.7 08/05/2023 1649   BILITOT 0.4 08/02/2023 1128   GFRNONAA 27 (L) 08/09/2023 0414   GFRNONAA 13 (L) 08/02/2023 1128   GFRAA 32 (L) 03/30/2020 0932        ASSESSMENT and THERAPY PLAN:   [name] is a 82 y.o. female who presents for multifactorial anemia, previously treated with rituximab for ANCA positive hemolytic anemia, previously on maintenance rituximab every 12 weeks presented with worsening anemia thought to be related to her hemolysis.  She was diagnosed with elevated ANCA titers 1 is to 640 and previously treated for ANCA positive hemolytic anemia, seen by rheumatology Dr. Carleene Cooper back in 2020, responded at that time to prednisone and rituximab and continued on rituximab maintenance.  However her rituximab was changed to every 37-month infusions and she relapsed before her second cycle of every 56-month rituximab.    # Multifactorial Anemia:  --Bone marrow biopsy from 02/20/2022 showed normocellular marrow with trilineage hematopoiesis. No leukemia, lymphoma or high-grade dysplasia identified. --CT CAP from 02/20/2022 did not show any underlying cause for anemia.  --ANCA titers are normal when checked on 02/10/2022 -- Currently on EPO only every 2 weeks.  Today her hemoglobin is 7.6, no indication for transfusion.  I have offered her EPO shot earlier today but she would not want to wait.  She will return to clinic next week for labs and EPO.   #Warfarin therapy for St. Jude's valve --Under the care of  [facility].     # CKd stage IV, not an ideal candidate for  dialysis.   All questions were answered. The patient knows to call the clinic with any problems, questions or concerns. We can certainly see the patient much sooner if necessary.  Total encounter time:20 minutes*in  face-to-face visit time, chart review, lab review, care coordination, order entry, and documentation of the encounter time.   *Total Encounter Time as defined by the Centers for Medicare and Medicaid Services includes, in addition to the face-to-face time of a patient visit (documented in the note above) non-face-to-face time: obtaining and reviewing outside history, ordering and reviewing medications, tests or procedures, care coordination (communications with other health care professionals or caregivers) and documentation in the medical record.

## 2023-08-14 ENCOUNTER — Ambulatory Visit: Payer: Medicare HMO | Attending: Cardiology | Admitting: *Deleted

## 2023-08-14 ENCOUNTER — Telehealth: Payer: Self-pay

## 2023-08-14 DIAGNOSIS — Z952 Presence of prosthetic heart valve: Secondary | ICD-10-CM

## 2023-08-14 DIAGNOSIS — Z5181 Encounter for therapeutic drug level monitoring: Secondary | ICD-10-CM

## 2023-08-14 LAB — POCT INR: INR: 2.8 (ref 2.0–3.0)

## 2023-08-14 NOTE — Patient Instructions (Signed)
Continue warfarin 1 tablet (5mg  daily) Has been on that dose since discharge from hospital on 10/31 Stop Lovenox injections Stay consistent with Boost drinks (1/2 bottle each day).  Recheck in 1 week If starting any new medications please call.

## 2023-08-14 NOTE — Telephone Encounter (Signed)
Most recent lab results faxed to Annie Sable, Nephrologist. Fax confirmation received.

## 2023-08-16 ENCOUNTER — Ambulatory Visit (HOSPITAL_COMMUNITY)
Admission: RE | Admit: 2023-08-16 | Discharge: 2023-08-16 | Disposition: A | Discharge status: Home / Self Care | Payer: Medicare HMO | Source: Ambulatory Visit | Attending: Cardiology | Admitting: Cardiology

## 2023-08-16 ENCOUNTER — Telehealth (HOSPITAL_COMMUNITY): Payer: Self-pay

## 2023-08-16 ENCOUNTER — Encounter: Payer: Self-pay | Admitting: Internal Medicine

## 2023-08-16 DIAGNOSIS — I5032 Chronic diastolic (congestive) heart failure: Secondary | ICD-10-CM | POA: Insufficient documentation

## 2023-08-16 LAB — BASIC METABOLIC PANEL
Anion gap: 11 (ref 5–15)
BUN: 68 mg/dL — ABNORMAL HIGH (ref 8–23)
CO2: 19 mmol/L — ABNORMAL LOW (ref 22–32)
Calcium: 8.2 mg/dL — ABNORMAL LOW (ref 8.9–10.3)
Chloride: 105 mmol/L (ref 98–111)
Creatinine, Ser: 3.22 mg/dL — ABNORMAL HIGH (ref 0.44–1.00)
GFR, Estimated: 14 mL/min — ABNORMAL LOW (ref >60)
Glucose, Bld: 187 mg/dL — ABNORMAL HIGH (ref 70–99)
Potassium: 4.9 mmol/L (ref 3.5–5.1)
Sodium: 135 mmol/L (ref 135–145)

## 2023-08-16 NOTE — Telephone Encounter (Addendum)
Pt aware, agreeable, and verbalized understanding  Labs ordered   ----- Message from Alen Bleacher sent at 08/16/2023  2:03 PM EST ----- Renal function elevated, hold Lasix for 2 days. Repeat BMET 1 week.

## 2023-08-17 ENCOUNTER — Telehealth: Payer: Self-pay

## 2023-08-17 NOTE — Telephone Encounter (Signed)
Patient daughter [name] aware to contact PCP per Dr.Comer     Sorry to hear she is have no some discomfort. With pain with sitting and pressure I would not equate those symptoms with a UTI which is probably why it has returned. I would advise she see her primary doctor to evaluate her for other causes.      Hello,  I am [name] and Ms. [name] lives with me. She was released from [facility] on October 31st after treatment for a UTI. She is again complaining with pressure in the bottom of her stomach and hurting when sitting down. We think she is experiencing either another UTI or the one from the hospital did not completely heal.  Could you please call her in medication to relief the UTI if possible or advise to the next step.  Thank [name] (Daughter) [phone]

## 2023-08-19 ENCOUNTER — Other Ambulatory Visit (HOSPITAL_COMMUNITY): Payer: Self-pay | Admitting: Cardiology

## 2023-08-19 DIAGNOSIS — I5032 Chronic diastolic (congestive) heart failure: Secondary | ICD-10-CM

## 2023-08-20 ENCOUNTER — Encounter (HOSPITAL_COMMUNITY): Payer: Self-pay | Admitting: *Deleted

## 2023-08-20 ENCOUNTER — Emergency Department (HOSPITAL_COMMUNITY): Payer: Medicare HMO

## 2023-08-20 ENCOUNTER — Other Ambulatory Visit: Payer: Self-pay

## 2023-08-20 ENCOUNTER — Inpatient Hospital Stay (HOSPITAL_COMMUNITY)
Admission: EM | Admit: 2023-08-20 | Discharge: 2023-08-23 | DRG: 682 | Disposition: A | Discharge status: Hospice | Payer: Medicare HMO | Attending: Internal Medicine | Admitting: Internal Medicine

## 2023-08-20 DIAGNOSIS — D509 Iron deficiency anemia, unspecified: Secondary | ICD-10-CM | POA: Diagnosis present

## 2023-08-20 DIAGNOSIS — E8809 Other disorders of plasma-protein metabolism, not elsewhere classified: Secondary | ICD-10-CM | POA: Diagnosis present

## 2023-08-20 DIAGNOSIS — E441 Mild protein-calorie malnutrition: Secondary | ICD-10-CM | POA: Diagnosis present

## 2023-08-20 DIAGNOSIS — I272 Pulmonary hypertension, unspecified: Secondary | ICD-10-CM | POA: Diagnosis not present

## 2023-08-20 DIAGNOSIS — I5082 Biventricular heart failure: Secondary | ICD-10-CM | POA: Diagnosis present

## 2023-08-20 DIAGNOSIS — I4819 Other persistent atrial fibrillation: Secondary | ICD-10-CM | POA: Diagnosis not present

## 2023-08-20 DIAGNOSIS — Z66 Do not resuscitate: Secondary | ICD-10-CM | POA: Diagnosis present

## 2023-08-20 DIAGNOSIS — E875 Hyperkalemia: Secondary | ICD-10-CM | POA: Diagnosis present

## 2023-08-20 DIAGNOSIS — J984 Other disorders of lung: Secondary | ICD-10-CM | POA: Diagnosis not present

## 2023-08-20 DIAGNOSIS — I2721 Secondary pulmonary arterial hypertension: Secondary | ICD-10-CM | POA: Diagnosis present

## 2023-08-20 DIAGNOSIS — D6859 Other primary thrombophilia: Secondary | ICD-10-CM | POA: Diagnosis present

## 2023-08-20 DIAGNOSIS — Z6825 Body mass index (BMI) 25.0-25.9, adult: Secondary | ICD-10-CM

## 2023-08-20 DIAGNOSIS — R0602 Shortness of breath: Secondary | ICD-10-CM | POA: Diagnosis not present

## 2023-08-20 DIAGNOSIS — Z7901 Long term (current) use of anticoagulants: Secondary | ICD-10-CM

## 2023-08-20 DIAGNOSIS — E871 Hypo-osmolality and hyponatremia: Secondary | ICD-10-CM | POA: Diagnosis present

## 2023-08-20 DIAGNOSIS — Z90710 Acquired absence of both cervix and uterus: Secondary | ICD-10-CM

## 2023-08-20 DIAGNOSIS — Z9221 Personal history of antineoplastic chemotherapy: Secondary | ICD-10-CM

## 2023-08-20 DIAGNOSIS — D696 Thrombocytopenia, unspecified: Secondary | ICD-10-CM | POA: Diagnosis present

## 2023-08-20 DIAGNOSIS — Z90722 Acquired absence of ovaries, bilateral: Secondary | ICD-10-CM

## 2023-08-20 DIAGNOSIS — Z7982 Long term (current) use of aspirin: Secondary | ICD-10-CM

## 2023-08-20 DIAGNOSIS — M301 Polyarteritis with lung involvement [Churg-Strauss]: Secondary | ICD-10-CM | POA: Diagnosis not present

## 2023-08-20 DIAGNOSIS — Z952 Presence of prosthetic heart valve: Secondary | ICD-10-CM

## 2023-08-20 DIAGNOSIS — I482 Chronic atrial fibrillation, unspecified: Secondary | ICD-10-CM | POA: Diagnosis present

## 2023-08-20 DIAGNOSIS — L89152 Pressure ulcer of sacral region, stage 2: Secondary | ICD-10-CM | POA: Diagnosis present

## 2023-08-20 DIAGNOSIS — D631 Anemia in chronic kidney disease: Secondary | ICD-10-CM | POA: Diagnosis present

## 2023-08-20 DIAGNOSIS — N179 Acute kidney failure, unspecified: Principal | ICD-10-CM | POA: Diagnosis present

## 2023-08-20 DIAGNOSIS — Z1152 Encounter for screening for COVID-19: Secondary | ICD-10-CM | POA: Diagnosis not present

## 2023-08-20 DIAGNOSIS — E46 Unspecified protein-calorie malnutrition: Secondary | ICD-10-CM | POA: Diagnosis present

## 2023-08-20 DIAGNOSIS — Z7189 Other specified counseling: Secondary | ICD-10-CM | POA: Diagnosis not present

## 2023-08-20 DIAGNOSIS — Z515 Encounter for palliative care: Secondary | ICD-10-CM | POA: Diagnosis not present

## 2023-08-20 DIAGNOSIS — N186 End stage renal disease: Secondary | ICD-10-CM | POA: Diagnosis present

## 2023-08-20 DIAGNOSIS — I5033 Acute on chronic diastolic (congestive) heart failure: Secondary | ICD-10-CM | POA: Diagnosis not present

## 2023-08-20 DIAGNOSIS — Z83438 Family history of other disorder of lipoprotein metabolism and other lipidemia: Secondary | ICD-10-CM

## 2023-08-20 DIAGNOSIS — I130 Hypertensive heart and chronic kidney disease with heart failure and stage 1 through stage 4 chronic kidney disease, or unspecified chronic kidney disease: Secondary | ICD-10-CM | POA: Diagnosis not present

## 2023-08-20 DIAGNOSIS — E1151 Type 2 diabetes mellitus with diabetic peripheral angiopathy without gangrene: Secondary | ICD-10-CM | POA: Diagnosis present

## 2023-08-20 DIAGNOSIS — I2510 Atherosclerotic heart disease of native coronary artery without angina pectoris: Secondary | ICD-10-CM | POA: Diagnosis present

## 2023-08-20 DIAGNOSIS — R627 Adult failure to thrive: Principal | ICD-10-CM | POA: Diagnosis present

## 2023-08-20 DIAGNOSIS — F17210 Nicotine dependence, cigarettes, uncomplicated: Secondary | ICD-10-CM | POA: Diagnosis present

## 2023-08-20 DIAGNOSIS — R0609 Other forms of dyspnea: Secondary | ICD-10-CM | POA: Diagnosis not present

## 2023-08-20 DIAGNOSIS — N184 Chronic kidney disease, stage 4 (severe): Secondary | ICD-10-CM

## 2023-08-20 DIAGNOSIS — N178 Other acute kidney failure: Secondary | ICD-10-CM | POA: Diagnosis not present

## 2023-08-20 DIAGNOSIS — D649 Anemia, unspecified: Secondary | ICD-10-CM | POA: Diagnosis not present

## 2023-08-20 DIAGNOSIS — Z882 Allergy status to sulfonamides status: Secondary | ICD-10-CM

## 2023-08-20 DIAGNOSIS — F419 Anxiety disorder, unspecified: Secondary | ICD-10-CM | POA: Diagnosis present

## 2023-08-20 DIAGNOSIS — J90 Pleural effusion, not elsewhere classified: Secondary | ICD-10-CM | POA: Diagnosis not present

## 2023-08-20 DIAGNOSIS — Z8249 Family history of ischemic heart disease and other diseases of the circulatory system: Secondary | ICD-10-CM

## 2023-08-20 DIAGNOSIS — D638 Anemia in other chronic diseases classified elsewhere: Secondary | ICD-10-CM | POA: Diagnosis present

## 2023-08-20 DIAGNOSIS — I5032 Chronic diastolic (congestive) heart failure: Secondary | ICD-10-CM | POA: Diagnosis present

## 2023-08-20 DIAGNOSIS — I4811 Longstanding persistent atrial fibrillation: Secondary | ICD-10-CM | POA: Diagnosis present

## 2023-08-20 DIAGNOSIS — R918 Other nonspecific abnormal finding of lung field: Secondary | ICD-10-CM | POA: Diagnosis not present

## 2023-08-20 DIAGNOSIS — I5043 Acute on chronic combined systolic (congestive) and diastolic (congestive) heart failure: Secondary | ICD-10-CM | POA: Diagnosis not present

## 2023-08-20 DIAGNOSIS — E119 Type 2 diabetes mellitus without complications: Secondary | ICD-10-CM

## 2023-08-20 DIAGNOSIS — Z885 Allergy status to narcotic agent status: Secondary | ICD-10-CM

## 2023-08-20 DIAGNOSIS — Z794 Long term (current) use of insulin: Secondary | ICD-10-CM

## 2023-08-20 DIAGNOSIS — I10 Essential (primary) hypertension: Secondary | ICD-10-CM | POA: Diagnosis present

## 2023-08-20 DIAGNOSIS — I7782 Antineutrophilic cytoplasmic antibody (ANCA) vasculitis: Secondary | ICD-10-CM | POA: Diagnosis present

## 2023-08-20 DIAGNOSIS — Z79899 Other long term (current) drug therapy: Secondary | ICD-10-CM

## 2023-08-20 DIAGNOSIS — R531 Weakness: Secondary | ICD-10-CM | POA: Diagnosis not present

## 2023-08-20 DIAGNOSIS — E8770 Fluid overload, unspecified: Secondary | ICD-10-CM

## 2023-08-20 DIAGNOSIS — N3090 Cystitis, unspecified without hematuria: Secondary | ICD-10-CM | POA: Diagnosis present

## 2023-08-20 DIAGNOSIS — E1122 Type 2 diabetes mellitus with diabetic chronic kidney disease: Secondary | ICD-10-CM | POA: Diagnosis present

## 2023-08-20 DIAGNOSIS — Z880 Allergy status to penicillin: Secondary | ICD-10-CM

## 2023-08-20 DIAGNOSIS — R54 Age-related physical debility: Secondary | ICD-10-CM | POA: Diagnosis present

## 2023-08-20 DIAGNOSIS — Z951 Presence of aortocoronary bypass graft: Secondary | ICD-10-CM

## 2023-08-20 DIAGNOSIS — E7800 Pure hypercholesterolemia, unspecified: Secondary | ICD-10-CM | POA: Diagnosis present

## 2023-08-20 DIAGNOSIS — R600 Localized edema: Secondary | ICD-10-CM | POA: Diagnosis present

## 2023-08-20 DIAGNOSIS — Z888 Allergy status to other drugs, medicaments and biological substances status: Secondary | ICD-10-CM

## 2023-08-20 HISTORY — DX: Chronic kidney disease, stage 4 (severe): N18.4

## 2023-08-20 LAB — CBC WITH DIFFERENTIAL/PLATELET
Abs Immature Granulocytes: 0.03 10*3/uL (ref 0.00–0.07)
Basophils Absolute: 0.1 10*3/uL (ref 0.0–0.1)
Basophils Relative: 1 %
Eosinophils Absolute: 0.3 10*3/uL (ref 0.0–0.5)
Eosinophils Relative: 5 %
HCT: 26.1 % — ABNORMAL LOW (ref 36.0–46.0)
Hemoglobin: 8.4 g/dL — ABNORMAL LOW (ref 12.0–15.0)
Immature Granulocytes: 1 %
Lymphocytes Relative: 16 %
Lymphs Abs: 0.9 10*3/uL (ref 0.7–4.0)
MCH: 32.4 pg (ref 26.0–34.0)
MCHC: 32.2 g/dL (ref 30.0–36.0)
MCV: 100.8 fL — ABNORMAL HIGH (ref 80.0–100.0)
Monocytes Absolute: 0.6 10*3/uL (ref 0.1–1.0)
Monocytes Relative: 9 %
Neutro Abs: 4.2 10*3/uL (ref 1.7–7.7)
Neutrophils Relative %: 68 %
Platelets: 136 10*3/uL — ABNORMAL LOW (ref 150–400)
RBC: 2.59 MIL/uL — ABNORMAL LOW (ref 3.87–5.11)
RDW: 17.2 % — ABNORMAL HIGH (ref 11.5–15.5)
WBC: 6 10*3/uL (ref 4.0–10.5)
nRBC: 0 % (ref 0.0–0.2)

## 2023-08-20 LAB — URINALYSIS, ROUTINE W REFLEX MICROSCOPIC
Bilirubin Urine: NEGATIVE
Glucose, UA: NEGATIVE mg/dL
Hgb urine dipstick: NEGATIVE
Ketones, ur: NEGATIVE mg/dL
Leukocytes,Ua: NEGATIVE
Nitrite: NEGATIVE
Protein, ur: 100 mg/dL — AB
Specific Gravity, Urine: 1.008 (ref 1.005–1.030)
pH: 6 (ref 5.0–8.0)

## 2023-08-20 LAB — PROTIME-INR
INR: 3.7 — ABNORMAL HIGH (ref 0.8–1.2)
Prothrombin Time: 36.9 s — ABNORMAL HIGH (ref 11.4–15.2)

## 2023-08-20 LAB — TYPE AND SCREEN
ABO/RH(D): A POS
Antibody Screen: NEGATIVE

## 2023-08-20 LAB — COMPREHENSIVE METABOLIC PANEL
ALT: 13 U/L (ref 0–44)
AST: 22 U/L (ref 15–41)
Albumin: 2.9 g/dL — ABNORMAL LOW (ref 3.5–5.0)
Alkaline Phosphatase: 108 U/L (ref 38–126)
Anion gap: 10 (ref 5–15)
BUN: 88 mg/dL — ABNORMAL HIGH (ref 8–23)
CO2: 18 mmol/L — ABNORMAL LOW (ref 22–32)
Calcium: 8.3 mg/dL — ABNORMAL LOW (ref 8.9–10.3)
Chloride: 101 mmol/L (ref 98–111)
Creatinine, Ser: 4.92 mg/dL — ABNORMAL HIGH (ref 0.44–1.00)
GFR, Estimated: 8 mL/min — ABNORMAL LOW (ref >60)
Glucose, Bld: 176 mg/dL — ABNORMAL HIGH (ref 70–99)
Potassium: 5.4 mmol/L — ABNORMAL HIGH (ref 3.5–5.1)
Sodium: 129 mmol/L — ABNORMAL LOW (ref 135–145)
Total Bilirubin: 0.5 mg/dL (ref <1.2)
Total Protein: 5.5 g/dL — ABNORMAL LOW (ref 6.5–8.1)

## 2023-08-20 LAB — BRAIN NATRIURETIC PEPTIDE: B Natriuretic Peptide: 687 pg/mL — ABNORMAL HIGH (ref 0.0–100.0)

## 2023-08-20 LAB — LIPASE, BLOOD: Lipase: 39 U/L (ref 11–51)

## 2023-08-20 LAB — MAGNESIUM: Magnesium: 2.7 mg/dL — ABNORMAL HIGH (ref 1.7–2.4)

## 2023-08-20 LAB — TROPONIN I (HIGH SENSITIVITY)
Troponin I (High Sensitivity): 24 ng/L — ABNORMAL HIGH (ref <18)
Troponin I (High Sensitivity): 25 ng/L — ABNORMAL HIGH (ref <18)

## 2023-08-20 LAB — RESP PANEL BY RT-PCR (RSV, FLU A&B, COVID)  RVPGX2
Influenza A by PCR: NEGATIVE
Influenza B by PCR: NEGATIVE
Resp Syncytial Virus by PCR: NEGATIVE
SARS Coronavirus 2 by RT PCR: NEGATIVE

## 2023-08-20 LAB — MRSA NEXT GEN BY PCR, NASAL: MRSA by PCR Next Gen: NOT DETECTED

## 2023-08-20 LAB — GLUCOSE, CAPILLARY
Glucose-Capillary: 143 mg/dL — ABNORMAL HIGH (ref 70–99)
Glucose-Capillary: 170 mg/dL — ABNORMAL HIGH (ref 70–99)

## 2023-08-20 LAB — PHOSPHORUS: Phosphorus: 4.7 mg/dL — ABNORMAL HIGH (ref 2.5–4.6)

## 2023-08-20 MED ORDER — SODIUM CHLORIDE 0.9% FLUSH
3.0000 mL | Freq: Two times a day (BID) | INTRAVENOUS | Status: DC
  Administered 2023-08-20 – 2023-08-23 (×7): 3 mL via INTRAVENOUS

## 2023-08-20 MED ORDER — ALLOPURINOL 100 MG PO TABS
100.0000 mg | ORAL_TABLET | Freq: Every day | ORAL | Status: DC
  Administered 2023-08-21 – 2023-08-23 (×3): 100 mg via ORAL
  Filled 2023-08-20 (×3): qty 1

## 2023-08-20 MED ORDER — BISACODYL 5 MG PO TBEC: 5.0000 mg | DELAYED_RELEASE_TABLET | Freq: Every day | ORAL | Status: DC | PRN

## 2023-08-20 MED ORDER — SODIUM CHLORIDE 0.9% FLUSH
3.0000 mL | INTRAVENOUS | Status: DC | PRN
  Administered 2023-08-22: 3 mL via INTRAVENOUS

## 2023-08-20 MED ORDER — SODIUM CHLORIDE 0.9% FLUSH
3.0000 mL | Freq: Two times a day (BID) | INTRAVENOUS | Status: DC
  Administered 2023-08-20 – 2023-08-23 (×6): 3 mL via INTRAVENOUS

## 2023-08-20 MED ORDER — ONDANSETRON HCL 4 MG PO TABS: 4.0000 mg | ORAL_TABLET | Freq: Four times a day (QID) | ORAL | Status: DC | PRN

## 2023-08-20 MED ORDER — SODIUM BICARBONATE 650 MG PO TABS
650.0000 mg | ORAL_TABLET | Freq: Two times a day (BID) | ORAL | Status: DC
  Administered 2023-08-20 – 2023-08-23 (×6): 650 mg via ORAL
  Filled 2023-08-20 (×6): qty 1

## 2023-08-20 MED ORDER — CHLORHEXIDINE GLUCONATE CLOTH 2 % EX PADS
6.0000 | MEDICATED_PAD | Freq: Every day | CUTANEOUS | Status: DC
  Administered 2023-08-21: 6 via TOPICAL

## 2023-08-20 MED ORDER — SODIUM CHLORIDE 0.9 % IV SOLN
12.5000 mg | Freq: Once | INTRAVENOUS | Status: AC
  Administered 2023-08-20: 12.5 mg via INTRAVENOUS
  Filled 2023-08-20: qty 0.5

## 2023-08-20 MED ORDER — AMLODIPINE BESYLATE 5 MG PO TABS
10.0000 mg | ORAL_TABLET | Freq: Every day | ORAL | Status: DC
  Administered 2023-08-20 – 2023-08-22 (×3): 10 mg via ORAL
  Filled 2023-08-20 (×3): qty 2

## 2023-08-20 MED ORDER — ALPRAZOLAM 0.5 MG PO TABS
0.5000 mg | ORAL_TABLET | Freq: Every day | ORAL | Status: DC
Start: 2023-08-20 — End: 2023-08-23
  Administered 2023-08-20 – 2023-08-22 (×3): 0.5 mg via ORAL
  Filled 2023-08-20 (×3): qty 1

## 2023-08-20 MED ORDER — ACETAMINOPHEN 650 MG RE SUPP: 650.0000 mg | Freq: Four times a day (QID) | RECTAL | Status: DC | PRN

## 2023-08-20 MED ORDER — FUROSEMIDE 10 MG/ML IJ SOLN
40.0000 mg | Freq: Two times a day (BID) | INTRAMUSCULAR | Status: DC
  Administered 2023-08-20 – 2023-08-21 (×2): 40 mg via INTRAVENOUS
  Filled 2023-08-20 (×2): qty 4

## 2023-08-20 MED ORDER — FAMOTIDINE 20 MG PO TABS
20.0000 mg | ORAL_TABLET | Freq: Two times a day (BID) | ORAL | Status: DC
  Administered 2023-08-20 – 2023-08-22 (×4): 20 mg via ORAL
  Filled 2023-08-20 (×4): qty 1

## 2023-08-20 MED ORDER — INSULIN GLARGINE-YFGN 100 UNIT/ML ~~LOC~~ SOLN
5.0000 [IU] | Freq: Every day | SUBCUTANEOUS | Status: DC
  Administered 2023-08-20 – 2023-08-22 (×3): 5 [IU] via SUBCUTANEOUS
  Filled 2023-08-20 (×4): qty 0.05

## 2023-08-20 MED ORDER — SERTRALINE HCL 50 MG PO TABS
25.0000 mg | ORAL_TABLET | Freq: Every day | ORAL | Status: DC
  Administered 2023-08-20 – 2023-08-22 (×3): 25 mg via ORAL
  Filled 2023-08-20 (×3): qty 1

## 2023-08-20 MED ORDER — HYDRALAZINE HCL 20 MG/ML IJ SOLN
10.0000 mg | INTRAMUSCULAR | Status: DC | PRN
  Administered 2023-08-20: 10 mg via INTRAVENOUS
  Filled 2023-08-20: qty 1

## 2023-08-20 MED ORDER — PANTOPRAZOLE SODIUM 40 MG PO TBEC
40.0000 mg | DELAYED_RELEASE_TABLET | Freq: Two times a day (BID) | ORAL | Status: DC
  Administered 2023-08-20 – 2023-08-23 (×6): 40 mg via ORAL
  Filled 2023-08-20 (×6): qty 1

## 2023-08-20 MED ORDER — FUROSEMIDE 10 MG/ML IJ SOLN
40.0000 mg | Freq: Once | INTRAMUSCULAR | Status: AC
Start: 2023-08-20 — End: 2023-08-20
  Administered 2023-08-20: 40 mg via INTRAVENOUS
  Filled 2023-08-20: qty 4

## 2023-08-20 MED ORDER — WARFARIN SODIUM 5 MG PO TABS: 5.0000 mg | ORAL_TABLET | ORAL | Status: DC

## 2023-08-20 MED ORDER — TRAZODONE HCL 50 MG PO TABS
25.0000 mg | ORAL_TABLET | Freq: Every evening | ORAL | Status: DC | PRN
  Administered 2023-08-21: 25 mg via ORAL
  Filled 2023-08-20: qty 1

## 2023-08-20 MED ORDER — OXYCODONE HCL 5 MG PO TABS
5.0000 mg | ORAL_TABLET | ORAL | Status: DC | PRN
  Administered 2023-08-22: 5 mg via ORAL
  Filled 2023-08-20: qty 1

## 2023-08-20 MED ORDER — HYDRALAZINE HCL 50 MG PO TABS
100.0000 mg | ORAL_TABLET | Freq: Three times a day (TID) | ORAL | Status: DC
  Administered 2023-08-20 – 2023-08-23 (×9): 100 mg via ORAL
  Filled 2023-08-20 (×9): qty 2

## 2023-08-20 MED ORDER — BUDESONIDE 0.5 MG/2ML IN SUSP
0.5000 mg | Freq: Two times a day (BID) | RESPIRATORY_TRACT | Status: DC
  Administered 2023-08-20 – 2023-08-23 (×6): 0.5 mg via RESPIRATORY_TRACT
  Filled 2023-08-20 (×6): qty 2

## 2023-08-20 MED ORDER — FUROSEMIDE 10 MG/ML IJ SOLN
40.0000 mg | Freq: Two times a day (BID) | INTRAMUSCULAR | Status: DC
Start: 2023-08-21 — End: 2023-08-20

## 2023-08-20 MED ORDER — INSULIN ASPART 100 UNIT/ML IJ SOLN
0.0000 [IU] | Freq: Three times a day (TID) | INTRAMUSCULAR | Status: DC
  Administered 2023-08-20: 1 [IU] via SUBCUTANEOUS

## 2023-08-20 MED ORDER — FLEET ENEMA RE ENEM: 1.0000 | ENEMA | Freq: Once | RECTAL | Status: DC | PRN

## 2023-08-20 MED ORDER — ALBUMIN HUMAN 25 % IV SOLN
50.0000 g | Freq: Four times a day (QID) | INTRAVENOUS | Status: AC
  Administered 2023-08-20 (×2): 50 g via INTRAVENOUS
  Filled 2023-08-20 (×2): qty 200

## 2023-08-20 MED ORDER — CLONIDINE HCL 0.2 MG PO TABS
0.3000 mg | ORAL_TABLET | Freq: Two times a day (BID) | ORAL | Status: DC
  Administered 2023-08-20 – 2023-08-23 (×6): 0.3 mg via ORAL
  Filled 2023-08-20 (×6): qty 1

## 2023-08-20 MED ORDER — IPRATROPIUM BROMIDE 0.02 % IN SOLN
0.5000 mg | Freq: Four times a day (QID) | RESPIRATORY_TRACT | Status: DC | PRN
  Administered 2023-08-21: 0.5 mg via RESPIRATORY_TRACT
  Filled 2023-08-20: qty 2.5

## 2023-08-20 MED ORDER — ONDANSETRON HCL 4 MG/2ML IJ SOLN
4.0000 mg | Freq: Four times a day (QID) | INTRAMUSCULAR | Status: DC | PRN
  Administered 2023-08-20 – 2023-08-22 (×3): 4 mg via INTRAVENOUS
  Filled 2023-08-20 (×4): qty 2

## 2023-08-20 MED ORDER — ACETAMINOPHEN 500 MG PO TABS: 500.0000 mg | ORAL_TABLET | Freq: Four times a day (QID) | ORAL | Status: DC | PRN

## 2023-08-20 MED ORDER — LEVALBUTEROL HCL 0.63 MG/3ML IN NEBU
0.6300 mg | INHALATION_SOLUTION | Freq: Four times a day (QID) | RESPIRATORY_TRACT | Status: DC | PRN
  Administered 2023-08-21: 0.63 mg via RESPIRATORY_TRACT
  Filled 2023-08-20: qty 3

## 2023-08-20 MED ORDER — HYDROMORPHONE HCL 1 MG/ML IJ SOLN
0.5000 mg | INTRAMUSCULAR | Status: DC | PRN
  Administered 2023-08-20 – 2023-08-22 (×2): 1 mg via INTRAVENOUS
  Filled 2023-08-20 (×2): qty 1

## 2023-08-20 MED ORDER — ATORVASTATIN CALCIUM 40 MG PO TABS
80.0000 mg | ORAL_TABLET | Freq: Every day | ORAL | Status: DC
  Administered 2023-08-20 – 2023-08-22 (×3): 80 mg via ORAL
  Filled 2023-08-20 (×3): qty 2

## 2023-08-20 MED ORDER — ACETAMINOPHEN 325 MG PO TABS
650.0000 mg | ORAL_TABLET | Freq: Four times a day (QID) | ORAL | Status: DC | PRN
  Administered 2023-08-21 – 2023-08-22 (×2): 650 mg via ORAL
  Filled 2023-08-20 (×2): qty 2

## 2023-08-20 MED ORDER — SODIUM CHLORIDE 0.9 % IV SOLN
250.0000 mL | INTRAVENOUS | Status: DC | PRN
Start: 2023-08-20 — End: 2023-08-23

## 2023-08-20 MED ORDER — ONDANSETRON HCL 4 MG/2ML IJ SOLN
4.0000 mg | Freq: Once | INTRAMUSCULAR | Status: DC
  Filled 2023-08-20: qty 2

## 2023-08-20 MED ORDER — SODIUM ZIRCONIUM CYCLOSILICATE 10 G PO PACK
10.0000 g | PACK | Freq: Every day | ORAL | Status: DC
  Administered 2023-08-20 – 2023-08-23 (×4): 10 g via ORAL
  Filled 2023-08-20 (×4): qty 1

## 2023-08-20 MED ORDER — FERROUS SULFATE 325 (65 FE) MG PO TABS
650.0000 mg | ORAL_TABLET | Freq: Every day | ORAL | Status: DC
  Administered 2023-08-21 – 2023-08-23 (×3): 650 mg via ORAL
  Filled 2023-08-20 (×3): qty 2

## 2023-08-20 MED ORDER — HEPARIN SODIUM (PORCINE) 5000 UNIT/ML IJ SOLN: 5000.0000 [IU] | Freq: Three times a day (TID) | INTRAMUSCULAR | Status: DC

## 2023-08-20 MED ORDER — SENNOSIDES-DOCUSATE SODIUM 8.6-50 MG PO TABS
1.0000 | ORAL_TABLET | Freq: Every evening | ORAL | Status: DC | PRN
  Administered 2023-08-21: 1 via ORAL
  Filled 2023-08-20: qty 1

## 2023-08-20 MED ORDER — PROMETHAZINE HCL 25 MG/ML IJ SOLN
INTRAMUSCULAR | Status: AC
  Filled 2023-08-20: qty 1

## 2023-08-20 MED ORDER — TADALAFIL (PAH) 20 MG PO TABS
40.0000 mg | ORAL_TABLET | Freq: Every day | ORAL | Status: DC
  Administered 2023-08-20 – 2023-08-22 (×3): 40 mg via ORAL
  Filled 2023-08-20 (×4): qty 2

## 2023-08-20 NOTE — Assessment & Plan Note (Signed)
-  Continue statins ?

## 2023-08-20 NOTE — Assessment & Plan Note (Addendum)
-  Acute on chronic kidney disease stage IV -Elevated creatinine from baseline Lab Results  Component Value Date   CREATININE 4.92 (H) 08/20/2023   CREATININE 3.22 (H) 08/16/2023   CREATININE 2.95 (H) 08/13/2023   -Fortunately week have to continue with Lasix due to volume overload, acute on chronic CHF laceration -Avoid all nephrotoxins -Adding sodium bicarb -Discussed the case with nephrologist Dr. Lorraine Lax with diuresing her and giving albumin--- see the patient in a.m.

## 2023-08-20 NOTE — Assessment & Plan Note (Addendum)
-   Acute on chronic diastolic CHF with exacerbation Of severe shortness of breath, generalized weakness, bilateral lower extremity edema -Shitting IV Lasix 40 mg twice daily -Monitor I's and O's, daily weight Filed Weights   08/20/23 1051  Weight: 49.9 kg  Last echo 02/23/2023 ED JF 60-70%, LVH, moderately increased right ventricular wall thickness, moderately elevated pulmonary artery systolic pressure, left atrial dilatation, right atrial moderately dilated, mild-moderate mitral regurgitation tricuspid regurgitation, repaired aortic valve with Saint Jude mechanical valve  -Home medications of Lasix, tadalafil, spironolactone, will be continued

## 2023-08-20 NOTE — Assessment & Plan Note (Signed)
-   Will monitor and replete accordingly -Courage p.o. intake

## 2023-08-20 NOTE — ED Triage Notes (Signed)
Pt c/o generalized weakness, not eating well, and sleeping a lot over the weekend. Pt was at [facility] on Friday and her hemoglobin was 8.0 at that time. Pt reports this is generally how she feels when she needs to have another blood transfusion.

## 2023-08-20 NOTE — Assessment & Plan Note (Addendum)
-   Continue Lasix, tadalafil, spironolactone,

## 2023-08-20 NOTE — Assessment & Plan Note (Signed)
-   On Coumadin, monitoring platelets closely

## 2023-08-20 NOTE — Assessment & Plan Note (Signed)
-  Holding home medication -Checking CBG q. ACHS, SSI coverage -Last A1c 6.22 weeks ago

## 2023-08-20 NOTE — Assessment & Plan Note (Addendum)
-   Of chronic disease-multifactorial, including iron deficiency - Patient follows with hematology as an outpatient, -Family received Granix and blood transfusion -Last blood transfusion 1 unit of PRBC on last admission October 2024 -Monitoring CBC closely, hemoglobin at baseline     Latest Ref Rng & Units 08/20/2023   12:12 PM 08/13/2023   12:41 PM 08/09/2023    4:14 AM  CBC  WBC 4.0 - 10.5 K/uL 6.0  6.5  5.5   Hemoglobin 12.0 - 15.0 g/dL 8.4  7.6  8.0   Hematocrit 36.0 - 46.0 % 26.1  24.0  25.7   Platelets 150 - 400 K/uL 136  124  143    -Continue iron supplements

## 2023-08-20 NOTE — Assessment & Plan Note (Signed)
-   On Coumadin -Therapeutic INR 3.7

## 2023-08-20 NOTE — Assessment & Plan Note (Signed)
-   Due to CHF exacerbation -Initiating IV diuretic Lasix

## 2023-08-20 NOTE — Progress Notes (Signed)
PHARMACY - ANTICOAGULATION CONSULT NOTE  Pharmacy Consult for warfarin Indication:  mechanical valve  Allergies  Allergen Reactions   Amlodipine Swelling   Amoxicillin-Pot Clavulanate Diarrhea, Nausea And Vomiting and Other (See Comments)   Carvedilol Other (See Comments)    Terrible cramping in the feet and had a lot of bowel movements, but not diarrhea   Chlorthalidone Other (See Comments)   Ciprofloxacin Diarrhea, Nausea Only and Other (See Comments)    Stomach ache   Codeine Other (See Comments)    Insomnia, anxiety   Escitalopram Other (See Comments)    Other Reaction(s): made head feel funny   Ezetimibe Other (See Comments)    Reaction not recalled   Fluconazole Diarrhea and Other (See Comments)   Immune Globulin (Human) Other (See Comments)    Severe/excruciating pain   Levofloxacin In D5w     Vomiting, diarrhea, insomnia   Losartan Other (See Comments) and Swelling    Patient doesn't recall site of swelling   Lovastatin     Other Reaction(s): do not remember   Niacin     Other Reaction(s): made her feel hot all over   Sertraline     Other Reaction(s): made head feel funny   Sulfamethoxazole-Trimethoprim Diarrhea, Nausea Only and Other (See Comments)    Stomach upset   Verapamil Hives and Other (See Comments)   Zocor [Simvastatin - High Dose] Other (See Comments)    Reaction not recalled   Gatifloxacin Other (See Comments) and Rash    Redness to skin around eye   Lovenox [Enoxaparin] Swelling and Rash    Swelling of legs and rash all over   Simvastatin Other (See Comments) and Rash    Patient Measurements: Height: 4\' 8"  (142.2 cm) Weight: 54.2 kg (119 lb 7.8 oz) IBW/kg (Calculated) : 36.3  Vital Signs: Temp: 97.6 F (36.4 C) (11/11 1524) Temp Source: Oral (11/11 1524) BP: 212/51 (11/11 1524) Pulse Rate: 60 (11/11 1524)  Labs: Recent Labs    08/20/23 1212 08/20/23 1412  HGB 8.4*  --   HCT 26.1*  --   PLT 136*  --   LABPROT 36.9*  --   INR 3.7*   --   CREATININE 4.92*  --   TROPONINIHS 24* 25*    Estimated Creatinine Clearance: 5.8 mL/min (A) (by C-G formula based on SCr of 4.92 mg/dL (H)).   Medical History: Past Medical History:  Diagnosis Date   Aortic atherosclerosis (HCC) 01/23/2018   Atrial fibrillation (HCC)    Benign positional vertigo 10/06/2011   Carotid artery disease (HCC)    Chemotherapy induced neutropenia (HCC) 07/21/2019   Cholelithiasis 01/23/2018   Cholelithiasis 01/23/2018   Chronic anticoagulation    Chronic kidney failure, stage 4 (severe) (HCC)    Colovesical fistula, ruled out 04/14/2022   Coronary artery disease    status post coronary artery bypass grafting times 07/10/2004   Diabetes mellitus    Hypercholesteremia    Hypertension    Mechanical heart valve present    H. aortic valve replacement at the time of bypass surgery October 2005   Moderate to severe pulmonary hypertension (HCC)    Peripheral arterial disease (HCC)    history of left common iliac artery PTA and stenting for a chronic total occlusion 08/26/01   S/P cholecystectomy 03/06/2018    Medications:  Medications Prior to Admission  Medication Sig Dispense Refill Last Dose   acetaminophen (TYLENOL) 500 MG tablet Take 500-1,000 mg by mouth every 6 (six) hours as needed for mild  pain (pain score 1-3) or fever.   08/19/2023   allopurinol (ZYLOPRIM) 100 MG tablet Take 100 mg by mouth daily.   08/20/2023   ALPRAZolam (XANAX) 0.5 MG tablet Take 0.5 mg by mouth at bedtime.   08/19/2023   amLODipine (NORVASC) 10 MG tablet Take 1 tablet (10 mg total) by mouth daily. (Patient taking differently: Take 10 mg by mouth at bedtime.) 90 tablet 3 08/19/2023   aspirin EC 81 MG tablet Take 81 mg by mouth daily. Swallow whole.   08/19/2023   atorvastatin (LIPITOR) 80 MG tablet Take 1 tablet (80 mg total) by mouth daily at 6 PM.   08/19/2023   budesonide (PULMICORT) 0.5 MG/2ML nebulizer solution Take 2 mLs (0.5 mg total) by nebulization 2 (two)  times daily. 120 mL 1 Past Week   Cholecalciferol (VITAMIN D3) 50 MCG (2000 UT) capsule Take 2,000 Units by mouth daily with lunch.   08/19/2023   cloNIDine (CATAPRES) 0.3 MG tablet Take 0.3 mg by mouth 2 (two) times daily.   08/20/2023   Cyanocobalamin (VITAMIN B12) 1000 MCG TBCR Take 1,000 mcg by mouth daily with lunch.   08/20/2023   famotidine (PEPCID) 20 MG tablet Take 20 mg by mouth 2 (two) times daily.   08/20/2023   ferrous sulfate 325 (65 FE) MG tablet Take 1 tablet (325 mg total) by mouth daily with breakfast. (Patient taking differently: Take 650 mg by mouth daily. Lunch time.)  3 08/19/2023   furosemide (LASIX) 40 MG tablet Patient take 1.5 (60 mg) tablet by mouth in the morning and 1.5 (60 mg) tablet at night. (Patient taking differently: Take 60 mg by mouth 2 (two) times daily.)   08/20/2023   hydrALAZINE (APRESOLINE) 100 MG tablet Take 1 tablet (100 mg total) by mouth 3 (three) times daily. 270 tablet 3 08/20/2023   insulin aspart (NOVOLOG) 100 UNIT/ML injection Inject 2-8 Units into the skin 3 (three) times daily with meals. Per sliding scale, If blood sugar is 200 to 250, give 2 units. If blood sugar is 251 to 300, give 4 units. If blood sugar is 301 to 350, give 6 units. If blood sugar is greater than 350, give 8 units and call MD.   Past Week   ipratropium-albuterol (DUONEB) 0.5-2.5 (3) MG/3ML SOLN Take 3 mLs by nebulization every 6 (six) hours as needed. 360 mL 0 Past Week   ondansetron (ZOFRAN-ODT) 4 MG disintegrating tablet Take 4 mg by mouth every 8 (eight) hours as needed for nausea or vomiting.   08/19/2023   pantoprazole (PROTONIX) 40 MG tablet Take 1 tablet (40 mg total) by mouth 2 (two) times daily. 60 tablet 2 08/20/2023   senna-docusate (SENOKOT-S) 8.6-50 MG tablet Take 2 tablets by mouth at bedtime. (Patient taking differently: Take 2 tablets by mouth at bedtime as needed for mild constipation.) 60 tablet 2 08/19/2023   sertraline (ZOLOFT) 25 MG tablet Take 25 mg by  mouth at bedtime.   08/19/2023   simethicone (GAS-X) 80 MG chewable tablet Chew 1 tablet (80 mg total) by mouth 4 (four) times daily as needed for flatulence. 100 tablet 2 Past Week   sodium bicarbonate 650 MG tablet Take 1 tablet (650 mg total) by mouth 2 (two) times daily. 60 tablet 0 08/20/2023   spironolactone (ALDACTONE) 25 MG tablet TAKE 1 TABLET EVERY DAY (Patient taking differently: Take 25 mg by mouth at bedtime.) 90 tablet 3 08/19/2023   tadalafil, PAH, (ADCIRCA) 20 MG tablet TAKE 2 TABLETS BY MOUTH ONCE DAILY (  Patient taking differently: Take 40 mg by mouth at bedtime.) 60 tablet 11 08/19/2023   warfarin (COUMADIN) 5 MG tablet Take 1 tablet (5 mg total) by mouth See admin instructions. Take 1 tablet 5mg  on Tuesday/Thursday/Sunday and 1/2 Tab (2.5 mg) on MWFSat (Patient taking differently: Take 5 mg by mouth See admin instructions. Take 1 tablet every day unitl visti at Coumadin Clinic Visit on Wednesday)   08/19/2023 at 1700   enoxaparin (LOVENOX) 60 MG/0.6ML injection Inject 0.5 mLs (50 mg total) into the skin daily for 7 days. (Patient not taking: Reported on 08/20/2023) 3.5 mL 0 Not Taking   Insulin Syringe-Needle U-100 (B-D INS SYR ULTRAFINE .3CC/29G) 29G X 1/2" 0.3 ML MISC Use for sliding scale for breakthrough high blood sugar readings 100 each 1    nitrofurantoin (MACRODANTIN) 100 MG capsule Take 100 mg by mouth 2 (two) times daily. (Patient not taking: Reported on 08/20/2023)   Not Taking    Assessment: 82 y.o. female with medical history significant for multiple medical condition including emphysematous cystitis, CKD 4, diastolic CHF, AIHA, mechanical aortic valve, hypertension and pulmonary hypertension, atrial fibrillation, GI bleed, peripheral artery disease, diabetes mellitus. She patient presented to the ED with complaints of weakness, not eating well. .  Patient chronically anticoagulated with warfarin and had slightly elevated INR 3.7.  Pharmacy to dose   INR 3.7, slightly  elevated   PTA dose. Patient said taking 5mg  daily  Goal of Therapy:  INR 2.5-3.5  Monitor platelets by anticoagulation protocol: Yes   Plan:  No warfarin today Daily PT-INR Monitor for S/s of bleeding  Elder Cyphers, BS Pharm D, BCPS Clinical Pharmacist 08/20/2023,3:40 PM

## 2023-08-20 NOTE — H&P (Signed)
History and Physical   Patient: [name]                            PCP: Gaspar Garbe, MD                    DOB: [birth year]            DOA: 08/20/2023 WNU:[id number]             DOS: 08/20/2023, 4:13 PM  [name], [name], MD  Patient coming from:   HOME  I have personally reviewed patient's medical records, in electronic medical records, including:  [name], and care everywhere.    Chief Complaint:   Chief Complaint  Patient presents with   Weakness    History of present illness:    [name] is a 82 year old female with extensive history of pulmonary hypertension, A-fib,h/o GI bleed, PAD, HTN, DM2, CKD 4, diastolic CHF, AIHA, emphysematous cystitis, mechanical aortic valve ... Recent hospitalization discharged on 08/09/23 for emphysematous cystitis related Klebsiella.  With ESBL.  Treated with IV gentamicin, also on last admission received 1 unit of PRBC transfusion.  Presented to ED with a chief complaint of not feeling well, poor p.o. intake, generalized weakness. Forted that she was taken off her Lasix by her cardiologist last week  ED course:  Blood pressure (!) 158/54, pulse (!) 50, temperature (!) 97.4 F (36.3 C), temperature source Oral, resp. rate 14, height 4\' 9"  (1.448 m), weight 49.9 kg, SpO2 98%.  CBC: WBC: 6.0 Hgb 8.4 INR: 3.7 BNP 687  Sodium 129 potassium: 5.4, glucose 176, BUN 88, creatinine 4.92 (Cr. around 2-3), albumin 2.9, GFR 8 (baseline around 27) UA-unremarkable  Given 40 mg IV Lasix, Lokelma in ED  Course of patient to be admitted for severe electrolyte acute on chronic kidney disease, volume overload.     Patient Denies having: Fever, Chills, Cough, SOB, Chest Pain, Abd pain, N/V/D, headache, dizziness, lightheadedness,  Dysuria, Joint pain, rash, open wounds     Review of Systems: As per HPI, otherwise 10 point review of systems were negative.    ----------------------------------------------------------------------------------------------------------------------  Allergies  Allergen Reactions   Amlodipine Swelling   Amoxicillin-Pot Clavulanate Diarrhea, Nausea And Vomiting and Other (See Comments)   Carvedilol Other (See Comments)    Terrible cramping in the feet and had a lot of bowel movements, but not diarrhea   Chlorthalidone Other (See Comments)   Ciprofloxacin Diarrhea, Nausea Only and Other (See Comments)    Stomach ache   Codeine Other (See Comments)    Insomnia, anxiety   Escitalopram Other (See Comments)    Other Reaction(s): made head feel funny   Ezetimibe Other (See Comments)    Reaction not recalled   Fluconazole Diarrhea and Other (See Comments)   Immune Globulin (Human) Other (See Comments)    Severe/excruciating pain   Levofloxacin In D5w     Vomiting, diarrhea, insomnia   Losartan Other (See Comments) and Swelling    Patient doesn't recall site of swelling   Lovastatin     Other Reaction(s): do not remember   Niacin     Other Reaction(s): made her feel hot all over   Sertraline     Other Reaction(s): made head feel funny   Sulfamethoxazole-Trimethoprim Diarrhea, Nausea Only and Other (See Comments)    Stomach upset   Verapamil Hives and Other (See Comments)   Zocor [Simvastatin - High  Dose] Other (See Comments)    Reaction not recalled   Gatifloxacin Other (See Comments) and Rash    Redness to skin around eye   Lovenox [Enoxaparin] Swelling and Rash    Swelling of legs and rash all over   Simvastatin Other (See Comments) and Rash    Home MEDs:  Prior to Admission medications   Medication Sig Start Date End Date Taking? Authorizing Provider  acetaminophen (TYLENOL) 500 MG tablet Take 500-1,000 mg by mouth every 6 (six) hours as needed for mild pain (pain score 1-3) or fever.   Yes [provider]  allopurinol (ZYLOPRIM) 100 MG tablet Take 100 mg by mouth daily.   Yes [provider]  ALPRAZolam Prudy Feeler) 0.5 MG tablet Take 0.5 mg by mouth at bedtime.   Yes [provider]  amLODipine (NORVASC) 10 MG tablet Take 1 tablet (10 mg total) by mouth daily. Patient taking differently: Take 10 mg by mouth at bedtime. 07/30/23  Yes Alen Bleacher, NP  aspirin EC 81 MG tablet Take 81 mg by mouth daily. Swallow whole.   Yes [provider]  atorvastatin (LIPITOR) 80 MG tablet Take 1 tablet (80 mg total) by mouth daily at 6 PM. 11/22/18  Yes Alekh, Kshitiz, MD  budesonide (PULMICORT) 0.5 MG/2ML nebulizer solution Take 2 mLs (0.5 mg total) by nebulization 2 (two) times daily. 08/09/23  Yes Shah, Pratik D, DO  Cholecalciferol (VITAMIN D3) 50 MCG (2000 UT) capsule Take 2,000 Units by mouth daily with lunch. 11/26/18  Yes [provider]  cloNIDine (CATAPRES) 0.3 MG tablet Take 0.3 mg by mouth 2 (two) times daily. 08/11/23  Yes [provider]  Cyanocobalamin (VITAMIN B12) 1000 MCG TBCR Take 1,000 mcg by mouth daily with lunch.   Yes [provider]  famotidine (PEPCID) 20 MG tablet Take 20 mg by mouth 2 (two) times daily.   Yes [provider]  ferrous sulfate 325 (65 FE) MG tablet Take 1 tablet (325 mg total) by mouth daily with breakfast. Patient taking differently: Take 650 mg by mouth daily. Lunch time. 09/06/22  Yes Johnson, Clanford L, MD  furosemide (LASIX) 40 MG tablet Patient take 1.5 (60 mg) tablet by mouth in the morning and 1.5 (60 mg) tablet at night. Patient taking differently: Take 60 mg by mouth 2 (two) times daily. 07/31/23  Yes Alen Bleacher, NP  hydrALAZINE (APRESOLINE) 100 MG tablet Take 1 tablet (100 mg total) by mouth 3 (three) times daily. 12/31/22  Yes Emokpae, Courage, MD  insulin aspart (NOVOLOG) 100 UNIT/ML injection Inject 2-8 Units into the skin 3 (three) times daily with meals. Per sliding scale, If blood sugar is 200 to 250, give 2 units. If blood sugar is 251 to 300, give 4 units. If blood sugar is  301 to 350, give 6 units. If blood sugar is greater than 350, give 8 units and call MD. 09/05/22  Yes Johnson, Clanford L, MD  ipratropium-albuterol (DUONEB) 0.5-2.5 (3) MG/3ML SOLN Take 3 mLs by nebulization every 6 (six) hours as needed. 08/09/23  Yes Shah, Pratik D, DO  ondansetron (ZOFRAN-ODT) 4 MG disintegrating tablet Take 4 mg by mouth every 8 (eight) hours as needed for nausea or vomiting. 01/12/22  Yes [provider]  pantoprazole (PROTONIX) 40 MG tablet Take 1 tablet (40 mg total) by mouth 2 (two) times daily. 12/31/22  Yes Emokpae, Courage, MD  senna-docusate (SENOKOT-S) 8.6-50 MG tablet Take 2 tablets by mouth at bedtime. Patient taking differently: Take  2 tablets by mouth at bedtime as needed for mild constipation. 12/31/22  Yes Emokpae, Courage, MD  sertraline (ZOLOFT) 25 MG tablet Take 25 mg by mouth at bedtime.   Yes [provider]  simethicone (GAS-X) 80 MG chewable tablet Chew 1 tablet (80 mg total) by mouth 4 (four) times daily as needed for flatulence. 04/16/22 06/25/24 Yes Danford, Earl Lites, MD  sodium bicarbonate 650 MG tablet Take 1 tablet (650 mg total) by mouth 2 (two) times daily. 08/09/23 09/08/23 Yes Shah, Pratik D, DO  spironolactone (ALDACTONE) 25 MG tablet TAKE 1 TABLET EVERY DAY Patient taking differently: Take 25 mg by mouth at bedtime. 05/14/23  Yes Laurey Morale, MD  tadalafil, PAH, (ADCIRCA) 20 MG tablet TAKE 2 TABLETS BY MOUTH ONCE DAILY Patient taking differently: Take 40 mg by mouth at bedtime. 12/11/22  Yes Laurey Morale, MD  warfarin (COUMADIN) 5 MG tablet Take 1 tablet (5 mg total) by mouth See admin instructions. Take 1 tablet 5mg  on Tuesday/Thursday/Sunday and 1/2 Tab (2.5 mg) on MWFSat Patient taking differently: Take 5 mg by mouth See admin instructions. Take 1 tablet every day unitl visti at Coumadin Clinic Visit on Wednesday 12/31/22  Yes Emokpae, Courage, MD  enoxaparin (LOVENOX) 60 MG/0.6ML injection Inject 0.5 mLs (50 mg total)  into the skin daily for 7 days. Patient not taking: Reported on 08/20/2023 08/10/23 08/17/23  Maurilio Lovely D, DO  Insulin Syringe-Needle U-100 (B-D INS SYR ULTRAFINE .3CC/29G) 29G X 1/2" 0.3 ML MISC Use for sliding scale for breakthrough high blood sugar readings 02/24/22   Rachel Moulds, MD  nitrofurantoin (MACRODANTIN) 100 MG capsule Take 100 mg by mouth 2 (two) times daily. Patient not taking: Reported on 08/20/2023 07/12/23   [provider]    PRN MEDs: sodium chloride, acetaminophen **OR** acetaminophen, hydrALAZINE, HYDROmorphone (DILAUDID) injection, ipratropium, levalbuterol, ondansetron **OR** ondansetron (ZOFRAN) IV, oxyCODONE, senna-docusate, sodium chloride flush, sodium phosphate, traZODone  Past Medical History:  Diagnosis Date   Aortic atherosclerosis (HCC) 01/23/2018   Atrial fibrillation (HCC)    Benign positional vertigo 10/06/2011   Carotid artery disease (HCC)    Chemotherapy induced neutropenia (HCC) 07/21/2019   Cholelithiasis 01/23/2018   Cholelithiasis 01/23/2018   Chronic anticoagulation    Chronic kidney failure, stage 4 (severe) (HCC)    Colovesical fistula, ruled out 04/14/2022   Coronary artery disease    status post coronary artery bypass grafting times 07/10/2004   Diabetes mellitus    Hypercholesteremia    Hypertension    Mechanical heart valve present    H. aortic valve replacement at the time of bypass surgery October 2005   Moderate to severe pulmonary hypertension (HCC)    Peripheral arterial disease (HCC)    history of left common iliac artery PTA and stenting for a chronic total occlusion 08/26/01   S/P cholecystectomy 03/06/2018    Past Surgical History:  Procedure Laterality Date   anterior repair  2009   AORTIC VALVE REPLACEMENT  2005   Dr. Laneta Simmers   CARDIAC CATHETERIZATION  11/10/2004   40% right common illiac, 70% in stent restenosis of distal left common illiac,    CARDIAC CATHETERIZATION  05/18/2004   LAD 50-70%  midstenosis, RCA dominant w/50% stenosis, 50% Right common Illiac artery ostial stenosis, 90% in stent restenosis within midportion of left common illiac stent   Carotid Duplex  03/12/2012   RSA-elev. velocities suggestive of a 50-69% diameter reduction, Right&Left Bulb/Prox ICA-mild-mod.fibrous plaqueelevating Velocities abnormal study.   CHOLECYSTECTOMY N/A 03/01/2018  Procedure: LAPAROSCOPIC CHOLECYSTECTOMY;  Surgeon: Jimmye Norman, MD;  Location[facility] OR;  Service: General;  Laterality: N/A;   COLONOSCOPY WITH PROPOFOL N/A 01/22/2018   Procedure: COLONOSCOPY WITH PROPOFOL;  Surgeon: Charlott Rakes, MD;  Location: [facility] ENDOSCOPY;  Service: Endoscopy;  Laterality: N/A;   ESOPHAGOGASTRODUODENOSCOPY N/A 07/13/2022   Procedure: ESOPHAGOGASTRODUODENOSCOPY (EGD);  Surgeon: Kerin Salen, MD;  Location: [facility] ENDOSCOPY;  Service: Gastroenterology;  Laterality: N/A;   ESOPHAGOGASTRODUODENOSCOPY (EGD) WITH PROPOFOL N/A 01/22/2018   Procedure: ESOPHAGOGASTRODUODENOSCOPY (EGD) WITH PROPOFOL;  Surgeon: Charlott Rakes, MD;  Location: [facility] ENDOSCOPY;  Service: Endoscopy;  Laterality: N/A;   ESOPHAGOGASTRODUODENOSCOPY (EGD) WITH PROPOFOL N/A 11/21/2018   Procedure: ESOPHAGOGASTRODUODENOSCOPY (EGD) WITH PROPOFOL;  Surgeon: Kerin Salen, MD;  Location: [facility] ENDOSCOPY;  Service: Gastroenterology;  Laterality: N/A;   ESOPHAGOGASTRODUODENOSCOPY (EGD) WITH PROPOFOL Left 02/27/2019   Procedure: ESOPHAGOGASTRODUODENOSCOPY (EGD) WITH PROPOFOL;  Surgeon: Willis Modena, MD;  Location: [facility] ENDOSCOPY;  Service: Endoscopy;  Laterality: Left;   GIVENS CAPSULE STUDY N/A 11/21/2018   Procedure: GIVENS CAPSULE STUDY;  Surgeon: Kerin Salen, MD;  Location: [facility] ENDOSCOPY;  Service: Gastroenterology;  Laterality: N/A;  To be deployed during EGD   Lower Ext. Duplex  03/12/2012   Right Proximal CIA- vessel narrowing w/elevated velocities 0-49% diameter reduction. Right SFA-mild mixed density plaque throughout vessel.   NM MYOCAR PERF WALL MOTION   05/19/2010   protocol: Persantine, post stress EF 65%, negative for ischemia, low risk scan   RIGHT HEART CATH N/A 06/27/2018   Procedure: RIGHT HEART CATH;  Surgeon: Laurey Morale, MD;  Location: [facility] INVASIVE CV LAB;  Service: Cardiovascular;  Laterality: N/A;   TOTAL ABDOMINAL HYSTERECTOMY W/ BILATERAL SALPINGOOPHORECTOMY  1989   TRANSTHORACIC ECHOCARDIOGRAM  08/29/2012   Moderately calcified annulus of mitral valve, moderate regurg. of both mitral valve and tricuspid valve.      reports that she has quit smoking. Her smoking use included cigarettes. She has a 30 pack-year smoking history. She has never used smokeless tobacco. She reports that she does not drink alcohol and does not use drugs.   Family History  Problem Relation Age of Onset   Heart disease Father    Hypertension Father    Hyperlipidemia Father    Breast cancer Neg Hx     Physical Exam:   Vitals:   08/20/23 1300 08/20/23 1330 08/20/23 1430 08/20/23 1524  BP: (!) 169/52  (!) 168/55 (!) 212/51  Pulse: (!) 46 (!) 56 (!) 46 60  Resp: 19 18 18  (!) 21  Temp:    97.6 F (36.4 C)  TempSrc:    Oral  SpO2: 97% 97% 99% 98%  Weight:    54.2 kg  Height:    4\' 8"  (1.422 m)   Constitutional: NAD, calm, comfortable Psychiatric: Normal judgment and insight. Alert and oriented x 3. Normal mood.  Eyes: PERRL, lids and conjunctivae normal ENMT: Mucous membranes are moist. Posterior pharynx clear of any exudate or lesions.Normal dentition.  Neck: normal, supple, no masses, no thyromegaly Respiratory: clear to auscultation bilaterally, no wheezing, no crackles. Normal respiratory effort. No accessory muscle use.  Cardiovascular: Systolic click, regularly irregular, + murmurs / rubs / gallops. +3 extremity edema. 2+ pedal pulses. No carotid bruits.  Minimal crackles bilateral lower lobe Abdomen: no tenderness, no masses palpated. No hepatosplenomegaly. Bowel sounds positive.  Musculoskeletal: no clubbing / cyanosis. No joint  deformity upper and lower extremities. Good ROM, no contractures. Normal muscle tone.  Neurologic: CN II-XII grossly intact. Sensation intact, DTR normal. Strength 5/5 in all  4.  Skin: no rashes, lesions, ulcers. No induration Wounds: per nursing documentation  Pressure Injury 01/13/23 Sacrum Medial;Bilateral Stage 2 -  Partial thickness loss of dermis presenting as a shallow open injury with a red, pink wound bed without slough. Wound is about the size of a quarter with linieal purple line through middle (Active)  01/13/23 1844  Location: Sacrum  Location Orientation: Medial;Bilateral  Staging: Stage 2 -  Partial thickness loss of dermis presenting as a shallow open injury with a red, pink wound bed without slough.  Wound Description (Comments): Wound is about the size of a quarter with linieal purple line through middle  Present on Admission: Yes         Labs on admission:    I have personally reviewed following labs and imaging studies  CBC: Recent Labs  Lab 08/20/23 1212  WBC 6.0  NEUTROABS 4.2  HGB 8.4*  HCT 26.1*  MCV 100.8*  PLT 136*   Basic Metabolic Panel: Recent Labs  Lab 08/16/23 1107 08/20/23 1212  NA 135 129*  K 4.9 5.4*  CL 105 101  CO2 19* 18*  GLUCOSE 187* 176*  BUN 68* 88*  CREATININE 3.22* 4.92*  CALCIUM 8.2* 8.3*  MG  --  2.7*  PHOS  --  4.7*   GFR: Estimated Creatinine Clearance: 5.8 mL/min (A) (by C-G formula based on SCr of 4.92 mg/dL (H)). Liver Function Tests: Recent Labs  Lab 08/20/23 1212  AST 22  ALT 13  ALKPHOS 108  BILITOT 0.5  PROT 5.5*  ALBUMIN 2.9*   Recent Labs  Lab 08/20/23 1212  LIPASE 39   No results for input(s): "AMMONIA" in the last 168 hours. Coagulation Profile: Recent Labs  Lab 08/14/23 1543 08/20/23 1212  INR 2.8 3.7*    Urine analysis:    Component Value Date/Time   COLORURINE YELLOW 08/20/2023 1330   APPEARANCEUR CLEAR 08/20/2023 1330   LABSPEC 1.008 08/20/2023 1330   PHURINE 6.0 08/20/2023  1330   GLUCOSEU NEGATIVE 08/20/2023 1330   HGBUR NEGATIVE 08/20/2023 1330   BILIRUBINUR NEGATIVE 08/20/2023 1330   BILIRUBINUR Negative 09/07/2021 1054   KETONESUR NEGATIVE 08/20/2023 1330   PROTEINUR 100 (A) 08/20/2023 1330   UROBILINOGEN 0.2 09/07/2021 1054   UROBILINOGEN 1.0 10/06/2011 1709   NITRITE NEGATIVE 08/20/2023 1330   LEUKOCYTESUR NEGATIVE 08/20/2023 1330    Last A1C:  Lab Results  Component Value Date   HGBA1C 6.2 (H) 08/05/2023     Radiologic Exams on Admission:   No results found.  EKG:   Independently reviewed.  Orders placed or performed during the hospital encounter of 08/20/23   ED EKG   ED EKG   EKG 12-Lead   *Note: Due to a large number of results and/or encounters for the requested time period, some results have not been displayed. A complete set of results can be found in Results Review.   ---------------------------------------------------------------------------------------------------------------------------------------    Assessment / Plan:   Principal Problem:   Acute renal failure superimposed on stage 4 chronic kidney disease (HCC) Active Problems:   Chronic diastolic CHF (congestive heart failure) (HCC)   H/O mechanical aortic valve replacement   Acute on chronic anemia   Chronic anticoagulation   Anxiety   Diabetes mellitus type 2, controlled (HCC)   Bilateral lower extremity edema   DNR (do not resuscitate)/DNI(Do Not Intubate)   Hyponatremia   Essential hypertension   Hypercholesteremia   Thrombocytopenia (HCC)   Chronic atrial fibrillation (HCC)   Hyperkalemia   PAH (  pulmonary artery hypertension) (HCC)   Anemia of chronic disease   Hypoalbuminemia due to protein-calorie malnutrition (HCC)   CAD, multiple vessel   Assessment and Plan: * Acute renal failure superimposed on stage 4 chronic kidney disease (HCC) -Acute on chronic kidney disease stage IV -Elevated creatinine from baseline Lab Results  Component Value  Date   CREATININE 4.92 (H) 08/20/2023   CREATININE 3.22 (H) 08/16/2023   CREATININE 2.95 (H) 08/13/2023   -Fortunately week have to continue with Lasix due to volume overload, acute on chronic CHF laceration -Avoid all nephrotoxins -Adding sodium bicarb -Discussed the case with nephrologist Dr. Lorraine Lax with diuresing her and giving albumin--- see the patient in a.m.  Chronic diastolic CHF (congestive heart failure) (HCC) - Acute on chronic diastolic CHF with exacerbation Of severe shortness of breath, generalized weakness, bilateral lower extremity edema -Shitting IV Lasix 40 mg twice daily -Monitor I's and O's, daily weight Filed Weights   08/20/23 1051  Weight: 49.9 kg  Last echo 02/23/2023 ED JF 60-70%, LVH, moderately increased right ventricular wall thickness, moderately elevated pulmonary artery systolic pressure, left atrial dilatation, right atrial moderately dilated, mild-moderate mitral regurgitation tricuspid regurgitation, repaired aortic valve with [employer] mechanical valve  -Home medications of Lasix, tadalafil, spironolactone, will be continued  Acute on chronic anemia - Of chronic disease-multifactorial, including iron deficiency - Patient follows with hematology as an outpatient, -Family received Granix and blood transfusion -Last blood transfusion 1 unit of PRBC on last admission October 2024 -Monitoring CBC closely, hemoglobin at baseline     Latest Ref Rng & Units 08/20/2023   12:12 PM 08/13/2023   12:41 PM 08/09/2023    4:14 AM  CBC  WBC 4.0 - 10.5 K/uL 6.0  6.5  5.5   Hemoglobin 12.0 - 15.0 g/dL 8.4  7.6  8.0   Hematocrit 36.0 - 46.0 % 26.1  24.0  25.7   Platelets 150 - 400 K/uL 136  124  143    -Continue iron supplements  H/O mechanical aortic valve replacement - On Coumadin -Therapeutic INR 3.7  DNR (do not resuscitate)/DNI(Do Not Intubate) - Confirmed DNR/DNI status   Bilateral lower extremity edema - Due to CHF  exacerbation -Initiating IV diuretic Lasix  CKD (chronic kidney disease), stage IV (HCC) Acute on chronic CKD stage IV -Continue to monitor, elevated creatinine from baseline Unfortunately will need Lasix Avoiding other nephrotoxins  Diabetes mellitus type 2, controlled (HCC) -Holding home medication -Checking CBG q. ACHS, SSI coverage -Last A1c 6.22 weeks ago  Chronic anticoagulation - On Coumadin due to mechanical aortic valve and A-fib -INR goal 2.5-3.5 INR currently therapeutic at 3.7 -Consulting pharmacy to monitor and adjust dose of Coumadin  Hyponatremia Serum sodium at 129 likely due to volume overload, Baseline sodium 131-135 -Continue with IV diuretics -Monitor serum sodium closely  CAD, multiple vessel Continue anticoagulation, continue statins,  Hypoalbuminemia due to protein-calorie malnutrition (HCC) - Will monitor and replete accordingly -Courage p.o. intake  PAH (pulmonary artery hypertension) (HCC) - Continue Lasix, tadalafil, spironolactone,  Hyperkalemia - Potassium 5.4 in the setting of a on CKD -Dose of Lokelma given in ED, will monitor closely -Patient will start diuretics IV Lasix,  Chronic atrial fibrillation (HCC) - Continue rate control meds  - continue Coumadin   Thrombocytopenia (HCC) - On Coumadin, monitoring platelets closely  Hypercholesteremia - Continue statins  Essential hypertension - Stable, reviewing home medication resuming accordingly -Will monitor BP closely, on IV Lasix  Consults called: none  ------------------------------------------------------------------------------------------------------------------------------------ DVT prophylaxis:  TED hose Start: 08/20/23 1439 SCDs Start: 08/20/23 1439 On Coumadin therapeutic INR    Code Status:   Code Status: Full Code   Admission status: Patient will be admitted as Inpatient, with a less than 2 midnight length of stay. Level of care:  Stepdown   Family Communication:  none at bedside  (The above findings and plan of care has been discussed with patient in detail, the patient expressed understanding and agreement of above plan)  --------------------------------------------------------------------------------------------------------------------------------------------------  Disposition Plan:  Anticipated 1-2 days Status is: Inpatient Remains inpatient appropriate because: Doing IV diuretics electrolytes management     ----------------------------------------------------------------------------------------------------------------------------------------------------  Local care time time spent:  66  Min.  Was spent seeing and evaluating the patient, reviewing all medical records, drawn plan of care.  All medical issues were addressed as mentioned above, treatment plan initiated, discussed with consultant and ICU staff.  SIGNED: Kendell Bane, MD, FHM. FAAFP. [place] [facility], Pager  (Please use [email or web address] to page/ or secure chat through epic) If 7PM-7AM, please contact night-coverage [email or web address],  08/20/2023, 4:13 PM

## 2023-08-20 NOTE — Assessment & Plan Note (Signed)
Acute on chronic CKD stage IV -Continue to monitor, elevated creatinine from baseline Unfortunately will need Lasix Avoiding other nephrotoxins

## 2023-08-20 NOTE — Assessment & Plan Note (Signed)
Serum sodium at 129 likely due to volume overload, Baseline sodium 131-135 -Continue with IV diuretics -Monitor serum sodium closely

## 2023-08-20 NOTE — Assessment & Plan Note (Signed)
Continue anticoagulation, continue statins,

## 2023-08-20 NOTE — Assessment & Plan Note (Signed)
-   On Coumadin due to mechanical aortic valve and A-fib -INR goal 2.5-3.5 INR currently therapeutic at 3.7 -Consulting pharmacy to monitor and adjust dose of Coumadin

## 2023-08-20 NOTE — Assessment & Plan Note (Signed)
-   Continue rate control meds  - continue Coumadin

## 2023-08-20 NOTE — ED Provider Notes (Signed)
[place] EMERGENCY DEPARTMENT AT [facility] Provider Note   CSN: [id number] Arrival date & time: 08/20/23  1024     History Chief Complaint  Patient presents with   Weakness    [name] is a 82 y.o. 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  She was recently discharged in the hospital on 08-09-2023 due to her cystitis, supratherapeutic INR as well as an AKI.  She reports that since then she still been experiencing some generalized weakness.  She been having some diarrhea off and on however this is not a new symptom for her.  She reports that she was discharged home on Lovenox and developed a small red rash to her arms and legs however distally lasted for around 3 to 4 days and has not been present since Friday.  She is back on Coumadin.  She reports that she is been having some bilateral leg swelling for the past week as a feeling some mild shortness of breath, but associates this as she gets the same feeling whenever her blood is low.  She denies any chest pain.  She reports that she has had some abdominal bloating/swelling for the past week but no vomiting.  Reports chronic nausea that is unchanged.  No abdominal pain however.  Denies any melena or hematochezia.  Denies any fevers or dysuria or hematuria.  Denies any recent cough or cold symptoms.  She reports that Dr. Shirlee Latch recently took her off of her Lasix a few days ago because of kidney function, however I am unable to see the note. She reports that she was supposed to get an infusion at the cancer center on 11/4, however she didn't want to wait and left. Daughter at bedside reports that she has been fatigued and not wanting to eat or drink as much. She denies any urinary symptoms or trouble urinating.      Weakness Associated symptoms: diarrhea (chronic, unchanged), nausea (chronic, unchanged) and shortness of breath   Associated symptoms: no abdominal pain, no chest pain, no cough, no dysuria, no fever and no vomiting        Home Medications Prior to Admission medications   Medication Sig Start Date End Date Taking? Authorizing Provider  acetaminophen (TYLENOL) 500 MG tablet Take 500-1,000 mg by mouth every 6 (six) hours as needed for mild pain (pain score 1-3) or fever.   Yes [provider]  allopurinol (ZYLOPRIM) 100 MG tablet Take 100 mg by mouth daily.   Yes [provider]  ALPRAZolam Prudy Feeler) 0.5 MG tablet Take 0.5 mg by mouth at bedtime.   Yes [provider]  amLODipine (NORVASC) 10 MG tablet Take 1 tablet (10 mg total) by mouth daily. Patient taking differently: Take 10 mg by mouth at bedtime. 07/30/23  Yes Alen Bleacher, NP  aspirin EC 81 MG tablet Take 81 mg by mouth daily. Swallow whole.   Yes [provider]  atorvastatin (LIPITOR) 80 MG tablet Take 1 tablet (80 mg total) by mouth daily at 6 PM. 11/22/18  Yes Alekh, Kshitiz, MD  budesonide (PULMICORT) 0.5 MG/2ML nebulizer solution Take 2 mLs (0.5 mg total) by nebulization 2 (two) times daily. 08/09/23  Yes Shah, Pratik D, DO  Cholecalciferol (VITAMIN D3) 50 MCG (2000 UT) capsule Take 2,000 Units by mouth daily with lunch. 11/26/18  Yes [provider]  cloNIDine (CATAPRES) 0.3 MG tablet Take 0.3 mg by mouth 2 (two) times daily. 08/11/23  Yes [provider]  Cyanocobalamin (VITAMIN B12) 1000 MCG TBCR Take 1,000 mcg by mouth daily with lunch.   Yes [provider]  famotidine (PEPCID) 20 MG tablet Take 20 mg by mouth 2 (two) times daily.   Yes [provider]  ferrous sulfate 325 (65 FE) MG tablet Take 1 tablet (325 mg total) by mouth daily with breakfast. Patient taking differently: Take 650 mg by mouth daily. Lunch time. 09/06/22  Yes Johnson, Clanford L,  MD  furosemide (LASIX) 40 MG tablet Patient take 1.5 (60 mg) tablet by mouth in the morning and 1.5 (60 mg) tablet at night. Patient taking differently: Take 60 mg by mouth 2 (two) times daily. 07/31/23  Yes Alen Bleacher, NP  hydrALAZINE (APRESOLINE) 100 MG tablet Take 1 tablet (100 mg total) by mouth 3 (three) times daily. 12/31/22  Yes Emokpae, Courage, MD  insulin aspart (NOVOLOG) 100 UNIT/ML injection Inject 2-8 Units into the skin 3 (three) times daily with meals. Per sliding scale, If blood sugar is 200 to 250, give 2 units. If blood sugar is 251 to 300, give 4 units. If blood sugar is 301 to 350, give 6 units. If blood sugar is greater than 350, give 8 units and call MD. 09/05/22  Yes Johnson, Clanford L, MD  ipratropium-albuterol (DUONEB) 0.5-2.5 (3) MG/3ML SOLN Take 3 mLs by nebulization every 6 (six) hours as needed. 08/09/23  Yes Shah, Pratik D, DO  ondansetron (ZOFRAN-ODT) 4 MG disintegrating tablet Take 4 mg by mouth every 8 (eight) hours as needed for nausea or vomiting. 01/12/22  Yes [provider]  pantoprazole (PROTONIX) 40 MG tablet Take 1 tablet (40 mg total) by mouth 2 (two) times daily. 12/31/22  Yes Emokpae, Courage, MD  senna-docusate (SENOKOT-S) 8.6-50 MG tablet Take 2 tablets by mouth at bedtime. Patient taking differently: Take 2 tablets by mouth at bedtime as needed for mild constipation. 12/31/22  Yes Emokpae, Courage, MD  sertraline (ZOLOFT) 25 MG tablet Take 25 mg by mouth at bedtime.   Yes [provider]  simethicone (GAS-X) 80 MG chewable tablet Chew 1 tablet (80 mg total) by mouth 4 (four) times daily as needed for flatulence. 04/16/22 06/25/24 Yes Danford, Earl Lites, MD  sodium bicarbonate 650 MG tablet Take 1 tablet (650 mg total) by mouth 2 (two) times daily. 08/09/23 09/08/23 Yes Shah, Pratik D, DO  spironolactone (ALDACTONE) 25 MG tablet TAKE 1 TABLET EVERY DAY Patient taking differently: Take 25 mg by mouth at bedtime. 05/14/23  Yes Laurey Morale, MD  tadalafil, PAH, (ADCIRCA) 20 MG tablet TAKE 2 TABLETS BY MOUTH ONCE DAILY Patient taking differently: Take 40 mg by mouth at bedtime. 12/11/22  Yes Laurey Morale, MD  warfarin (COUMADIN) 5 MG tablet Take 1 tablet (5 mg total) by mouth See admin instructions. Take 1 tablet 5mg  on Tuesday/Thursday/Sunday and 1/2 Tab (2.5 mg) on MWFSat Patient taking differently: Take 5 mg by mouth See admin instructions. Take 1 tablet every day unitl visti at [facility] Visit on Wednesday 12/31/22  Yes Emokpae, Courage, MD  enoxaparin (LOVENOX) 60 MG/0.6ML injection Inject 0.5 mLs (50 mg total) into the skin daily for 7 days. Patient not taking: Reported on 08/20/2023 08/10/23 08/17/23  Maurilio Lovely D, DO  Insulin Syringe-Needle U-100 (B-D INS SYR ULTRAFINE .3CC/29G) 29G X 1/2" 0.3 ML MISC Use for sliding scale  for breakthrough high blood sugar readings 02/24/22   Rachel Moulds, MD  nitrofurantoin (MACRODANTIN) 100 MG capsule Take 100 mg by mouth 2 (two) times daily. Patient not taking: Reported on 08/20/2023 07/12/23   [provider]      Allergies    Amlodipine, Amoxicillin-pot clavulanate, Carvedilol, Chlorthalidone, Ciprofloxacin, Codeine, Escitalopram, Ezetimibe, Fluconazole, Immune globulin (human), Levofloxacin in d5w, Losartan, Lovastatin, Niacin, Sertraline, Sulfamethoxazole-trimethoprim, Verapamil, Zocor [simvastatin - high dose], Gatifloxacin, Lovenox [enoxaparin], and Simvastatin    Review of Systems   Review of Systems  Constitutional:  Positive for fatigue. Negative for chills and fever.  HENT:  Negative for congestion.   Respiratory:  Positive for shortness of breath. Negative for cough.   Cardiovascular:  Positive for leg swelling. Negative for chest pain and palpitations.  Gastrointestinal:  Positive for diarrhea (chronic, unchanged) and nausea (chronic, unchanged). Negative for abdominal pain, blood in stool and vomiting.  Genitourinary:  Negative for dysuria and  hematuria.  Neurological:  Positive for weakness.    Physical Exam Updated Vital Signs BP (!) 168/55   Pulse (!) 46   Temp (!) 97.4 F (36.3 C) (Oral)   Resp 18   Ht 4\' 9"  (1.448 m)   Wt 49.9 kg   SpO2 99%   BMI 23.80 kg/m  Physical Exam Vitals and nursing note reviewed.  Constitutional:      Appearance: She is not toxic-appearing.     Comments: Cachectic, chronically ill-appearing  HENT:     Nose: Nose normal.  Eyes:     General: No scleral icterus. Cardiovascular:     Rate and Rhythm: Bradycardia present.  Pulmonary:     Effort: Pulmonary effort is normal. No respiratory distress.     Breath sounds: Normal breath sounds.  Abdominal:     Tenderness: There is no abdominal tenderness. There is no guarding or rebound.     Comments: Mildly distended abdomen. Some bruising noted towards periumbilical area, question if this is from Lovenox. Soft.   Musculoskeletal:     Right lower leg: Edema present.     Left lower leg: Edema present.     Comments: 2+ pitting edema bilaterally  Skin:    General: Skin is warm and dry.  Neurological:     Mental Status: She is alert.     Comments: Generalized weakness in upper and lower extremities however no focal weakness.     ED Results / Procedures / Treatments   Labs (all labs ordered are listed, but only abnormal results are displayed) Labs Reviewed  PROTIME-INR - Abnormal; Notable for the following components:      Result Value   Prothrombin Time 36.9 (*)    INR 3.7 (*)    All other components within normal limits  BRAIN NATRIURETIC PEPTIDE - Abnormal; Notable for the following components:   B Natriuretic Peptide 687.0 (*)    All other components within normal limits  CBC WITH DIFFERENTIAL/PLATELET - Abnormal; Notable for the following components:   RBC 2.59 (*)    Hemoglobin 8.4 (*)    HCT 26.1 (*)    MCV 100.8 (*)    RDW 17.2 (*)    Platelets 136 (*)    All other components within normal limits  COMPREHENSIVE  METABOLIC PANEL - Abnormal; Notable for the following components:   Sodium 129 (*)    Potassium 5.4 (*)    CO2 18 (*)    Glucose, Bld 176 (*)    BUN 88 (*)    Creatinine, Ser 4.92 (*)  Calcium 8.3 (*)    Total Protein 5.5 (*)    Albumin 2.9 (*)    GFR, Estimated 8 (*)    All other components within normal limits  URINALYSIS, ROUTINE W REFLEX MICROSCOPIC - Abnormal; Notable for the following components:   Protein, ur 100 (*)    Bacteria, UA FEW (*)    All other components within normal limits  TROPONIN I (HIGH SENSITIVITY) - Abnormal; Notable for the following components:   Troponin I (High Sensitivity) 24 (*)    All other components within normal limits  RESP PANEL BY RT-PCR (RSV, FLU A&B, COVID)  RVPGX2  MRSA NEXT GEN BY PCR, NASAL  LIPASE, BLOOD  MAGNESIUM  PHOSPHORUS  POC OCCULT BLOOD, ED  TYPE AND SCREEN  TROPONIN I (HIGH SENSITIVITY)    EKG None  Radiology No results found.  Procedures Procedures   Medications Ordered in ED Medications  furosemide (LASIX) injection 40 mg (has no administration in time range)  sodium zirconium cyclosilicate (LOKELMA) packet 10 g (has no administration in time range)  sodium chloride flush (NS) 0.9 % injection 3 mL (has no administration in time range)  sodium chloride flush (NS) 0.9 % injection 3 mL (has no administration in time range)  sodium chloride flush (NS) 0.9 % injection 3 mL (has no administration in time range)  0.9 %  sodium chloride infusion (has no administration in time range)  acetaminophen (TYLENOL) tablet 650 mg (has no administration in time range)    Or  acetaminophen (TYLENOL) suppository 650 mg (has no administration in time range)  oxyCODONE (Oxy IR/ROXICODONE) immediate release tablet 5 mg (has no administration in time range)  HYDROmorphone (DILAUDID) injection 0.5-1 mg (has no administration in time range)  traZODone (DESYREL) tablet 25 mg (has no administration in time range)  senna-docusate  (Senokot-S) tablet 1 tablet (has no administration in time range)  bisacodyl (DULCOLAX) EC tablet 5 mg (has no administration in time range)  sodium phosphate (FLEET) enema 1 enema (has no administration in time range)  ondansetron (ZOFRAN) tablet 4 mg (has no administration in time range)    Or  ondansetron (ZOFRAN) injection 4 mg (has no administration in time range)  levalbuterol (XOPENEX) nebulizer solution 0.63 mg (has no administration in time range)  hydrALAZINE (APRESOLINE) injection 10 mg (has no administration in time range)  ipratropium (ATROVENT) nebulizer solution 0.5 mg (has no administration in time range)  Chlorhexidine Gluconate Cloth 2 % PADS 6 each (has no administration in time range)    ED Course/ Medical Decision Making/ A&P                               Medical Decision Making Amount and/or Complexity of Data Reviewed Labs: ordered. Radiology: ordered.  Risk Prescription drug management. Decision regarding hospitalization.   82 y.o. female presents to the ER for evaluation of generalized weakness. Differential diagnosis includes but is not limited to CVA, spinal cord injury, ACS, arrhythmia, syncope, orthostatic hypotension, sepsis, hypoglycemia, hypoxia, electrolyte disturbance, endocrine disorder, anemia, environmental exposure, polypharmacy. Vital signs blood pressure 158/54, heart rate 58, satting well on room air without increased work of breathing, afebrile. Physical exam as noted above.   On previous chart evaluation, patient was discharged on 08-09-2023 for super therapeutic INR, AKI, UTI.  Appears that her goal INR is 2.5-3.5. I do not see a note from Dr. Alford Highland office, but the patient's daughter reports that they were told to  stop taking the Lasix.   I independently reviewed and interpreted the patient's labs.  CBC shows chronic thrombocytopenia at 136.  Hemoglobin 8.4 which has improved from patient's previous.  COVID, flu, RSV negative.  PT/INR  slightly elevated.  INR 3.7, patient's baseline goal is 2.5-3.5.  Lipase within normal limits.  BNP is elevated at 687 which is higher than it has been previously.  Troponin at 24.  Urinalysis shows 100 protein present with few bacteria but no leukocytes, nitrites, or white blood cells present.  CMP shows a new hyponatremia with a sodium of 129.  Potassium 5.4 with a bicarb of 18 however normal anion gap.  Glucose at 176.  BUN 88 with a creatinine of 4.92.  Creatinine was 3.22 4 days ago.  11 days ago creatinine was 1.82.  Creatinine significantly worsened over the past 2 weeks.  She has a decrease in calcium, total protein, and albumin.  No other LFT abnormality.  CXR pending radiologist interpretation.   Given the patient's worsening kidney function as well as new hyperkalemia and hyponatremia, I do feel that she meets inpatient criteria. I have given her Lokelma and some Lasix for treatment of the lower leg edema and the hyperkalemia. Discussed labs with patient and daughter who are amenable to admission. Dr. Flossie Dibble to admit.   Portions of this report may have been transcribed using voice recognition software. Every effort was made to ensure accuracy; however, inadvertent computerized transcription errors may be present.   Final Clinical Impression(s) / ED Diagnoses Final diagnoses:  Failure to thrive in adult  Hyperkalemia  AKI (acute kidney injury) (HCC)  Hyponatremia  Bilateral leg edema    Rx / DC Orders ED Discharge Orders     None         Achille Rich, PA-C 08/20/23 1539    Rondel Baton, MD 08/25/23 [phone]

## 2023-08-20 NOTE — Progress Notes (Signed)
Pt calm & cooperative with care. Tolerated IV lasix & albumin. Episode of nausea & retching. PRNs given. ST elevations noted on monitor, EKG completed see results. Patient has no other complaints at this time. Nephrology & cardiology consulted.

## 2023-08-20 NOTE — Assessment & Plan Note (Signed)
-   Stable, reviewing home medication resuming accordingly -Will monitor BP closely, on IV Lasix

## 2023-08-20 NOTE — Hospital Course (Signed)
[name] is a 82 year old female with extensive history of pulmonary hypertension, A-fib,h/o GI bleed, PAD, HTN, DM2, CKD 4, diastolic CHF, AIHA, emphysematous cystitis, mechanical aortic valve ... Recent hospitalization discharged on 08/09/23 for emphysematous cystitis related Klebsiella.  With ESBL.  Treated with IV gentamicin, also on last admission received 1 unit of PRBC transfusion.  Presented to ED with a chief complaint of not feeling well, poor p.o. intake, generalized weakness. Forted that she was taken off her Lasix by her cardiologist last week  ED course:  Blood pressure (!) 158/54, pulse (!) 50, temperature (!) 97.4 F (36.3 C), temperature source Oral, resp. rate 14, height 4\' 9"  (1.448 m), weight 49.9 kg, SpO2 98%.  CBC: WBC: 6.0 Hgb 8.4 INR: 3.7 BNP 687  Sodium 129 potassium: 5.4, glucose 176, BUN 88, creatinine 4.92 (Cr. around 2-3), albumin 2.9, GFR 8 (baseline around 27) UA-unremarkable  Given 40 mg IV Lasix, Lokelma in ED  Course of patient to be admitted for severe electrolyte acute on chronic kidney disease, volume overload.

## 2023-08-20 NOTE — Assessment & Plan Note (Signed)
-   Potassium 5.4 in the setting of a on CKD -Dose of Lokelma given in ED, will monitor closely -Patient will start diuretics IV Lasix,

## 2023-08-20 NOTE — Assessment & Plan Note (Signed)
Confirmed DNR/DNI status 

## 2023-08-21 ENCOUNTER — Encounter (HOSPITAL_COMMUNITY): Payer: Self-pay | Admitting: Family Medicine

## 2023-08-21 ENCOUNTER — Inpatient Hospital Stay (HOSPITAL_COMMUNITY): Payer: Medicare HMO

## 2023-08-21 ENCOUNTER — Other Ambulatory Visit (HOSPITAL_COMMUNITY): Payer: Self-pay | Admitting: *Deleted

## 2023-08-21 DIAGNOSIS — I5033 Acute on chronic diastolic (congestive) heart failure: Secondary | ICD-10-CM

## 2023-08-21 DIAGNOSIS — N184 Chronic kidney disease, stage 4 (severe): Secondary | ICD-10-CM | POA: Diagnosis not present

## 2023-08-21 DIAGNOSIS — Z7189 Other specified counseling: Secondary | ICD-10-CM

## 2023-08-21 DIAGNOSIS — R0609 Other forms of dyspnea: Secondary | ICD-10-CM

## 2023-08-21 DIAGNOSIS — Z515 Encounter for palliative care: Secondary | ICD-10-CM | POA: Diagnosis not present

## 2023-08-21 DIAGNOSIS — N178 Other acute kidney failure: Secondary | ICD-10-CM | POA: Diagnosis not present

## 2023-08-21 DIAGNOSIS — R627 Adult failure to thrive: Secondary | ICD-10-CM

## 2023-08-21 LAB — GLUCOSE, CAPILLARY
Glucose-Capillary: 107 mg/dL — ABNORMAL HIGH (ref 70–99)
Glucose-Capillary: 118 mg/dL — ABNORMAL HIGH (ref 70–99)
Glucose-Capillary: 112 mg/dL — ABNORMAL HIGH (ref 70–99)
Glucose-Capillary: 134 mg/dL — ABNORMAL HIGH (ref 70–99)

## 2023-08-21 LAB — ECHOCARDIOGRAM COMPLETE
AR max vel: 0.96 cm2
AV Area VTI: 0.99 cm2
AV Area mean vel: 1.04 cm2
AV Mean grad: 22 mm[Hg]
AV Peak grad: 39.9 mm[Hg]
Ao pk vel: 3.16 m/s
Area-P 1/2: 2.99 cm2
Height: 56 in
MV VTI: 1.56 cm2
S' Lateral: 2.5 cm
Weight: 1936.52 [oz_av]

## 2023-08-21 LAB — CBC
HCT: 24 % — ABNORMAL LOW (ref 36.0–46.0)
Hemoglobin: 7.3 g/dL — ABNORMAL LOW (ref 12.0–15.0)
MCH: 30.8 pg (ref 26.0–34.0)
MCHC: 30.4 g/dL (ref 30.0–36.0)
MCV: 101.3 fL — ABNORMAL HIGH (ref 80.0–100.0)
Platelets: 126 10*3/uL — ABNORMAL LOW (ref 150–400)
RBC: 2.37 MIL/uL — ABNORMAL LOW (ref 3.87–5.11)
RDW: 16.8 % — ABNORMAL HIGH (ref 11.5–15.5)
WBC: 6.1 10*3/uL (ref 4.0–10.5)
nRBC: 0 % (ref 0.0–0.2)

## 2023-08-21 LAB — COMPREHENSIVE METABOLIC PANEL
ALT: 10 U/L (ref 0–44)
AST: 20 U/L (ref 15–41)
Albumin: 4 g/dL (ref 3.5–5.0)
Alkaline Phosphatase: 85 U/L (ref 38–126)
Anion gap: 12 (ref 5–15)
BUN: 90 mg/dL — ABNORMAL HIGH (ref 8–23)
CO2: 20 mmol/L — ABNORMAL LOW (ref 22–32)
Calcium: 9.1 mg/dL (ref 8.9–10.3)
Chloride: 102 mmol/L (ref 98–111)
Creatinine, Ser: 5.27 mg/dL — ABNORMAL HIGH (ref 0.44–1.00)
GFR, Estimated: 8 mL/min — ABNORMAL LOW (ref >60)
Glucose, Bld: 101 mg/dL — ABNORMAL HIGH (ref 70–99)
Potassium: 5.4 mmol/L — ABNORMAL HIGH (ref 3.5–5.1)
Sodium: 134 mmol/L — ABNORMAL LOW (ref 135–145)
Total Bilirubin: 0.8 mg/dL (ref <1.2)
Total Protein: 6 g/dL — ABNORMAL LOW (ref 6.5–8.1)

## 2023-08-21 LAB — PROTIME-INR
INR: 3.7 — ABNORMAL HIGH (ref 0.8–1.2)
Prothrombin Time: 36.8 s — ABNORMAL HIGH (ref 11.4–15.2)

## 2023-08-21 LAB — APTT: aPTT: 50 s — ABNORMAL HIGH (ref 24–36)

## 2023-08-21 MED ORDER — METOPROLOL SUCCINATE ER 25 MG PO TB24: 12.5000 mg | ORAL_TABLET | Freq: Every day | ORAL | Status: DC

## 2023-08-21 MED ORDER — FUROSEMIDE 10 MG/ML IJ SOLN
80.0000 mg | Freq: Two times a day (BID) | INTRAMUSCULAR | Status: DC
  Administered 2023-08-21 – 2023-08-22 (×2): 80 mg via INTRAVENOUS
  Filled 2023-08-21 (×2): qty 8

## 2023-08-21 MED ORDER — ALBUMIN HUMAN 25 % IV SOLN
50.0000 g | Freq: Four times a day (QID) | INTRAVENOUS | Status: AC
  Administered 2023-08-21 (×2): 50 g via INTRAVENOUS
  Filled 2023-08-21 (×2): qty 200

## 2023-08-21 NOTE — Progress Notes (Signed)
*  PRELIMINARY RESULTS* Echocardiogram 2D Echocardiogram has been performed.  Stacey Drain 08/21/2023, 5:12 PM

## 2023-08-21 NOTE — TOC Initial Note (Signed)
Transition of Care [facility]) - Initial/Assessment Note    Patient Details  Name: [name] MRN: [id number] Date of Birth: 1941/09/07  Transition of Care [facility]) CM/SW Contact:    Elliot Gault, LCSW Phone Number: 08/21/2023, 10:28 AM  Clinical Narrative:                  Pt admitted from home. She is known to [facility] and had a recent admission.  Assessment completed due to high risk readmission score. Pt lives with her daughter. She requires assist with bathing. Pt ambulates with walker or rollator. No home health services currently. Pt's children take her to appointments. Pt indicates she is active with outpatient palliative care.  Palliative consulted. PT/OT evals pending. TOC will follow and continue to assess and assist with dc planning.        Expected Discharge Plan: Home w Home Health Services Barriers to Discharge: Continued Medical Work up   Patient Goals and CMS Choice Patient states their goals for this hospitalization and ongoing recovery are:: go home          Expected Discharge Plan and Services In-house Referral: Clinical Social Work   Post Acute Care Choice: Durable Medical Equipment Living arrangements for the past 2 months: Single Family Home                                      Prior Living Arrangements/Services Living arrangements for the past 2 months: Single Family Home Lives with:: Adult Children   Do you feel safe going back to the place where you live?: Yes      Need for Family Participation in Patient Care: Yes (Comment) Care giver support system in place?: Yes (comment) Current home services: DME Criminal Activity/Legal Involvement Pertinent to Current Situation/Hospitalization: No - Comment as needed  Activities of Daily Living   ADL Screening (condition at time of admission) Independently performs ADLs?: No Does the patient have a NEW difficulty with bathing/dressing/toileting/self-feeding that is expected to last >3 days?: Yes  (Initiates electronic notice to provider for possible OT consult) Does the patient have a NEW difficulty with getting in/out of bed, walking, or climbing stairs that is expected to last >3 days?: Yes (Initiates electronic notice to provider for possible PT consult) Does the patient have a NEW difficulty with communication that is expected to last >3 days?: No Is the patient deaf or have difficulty hearing?: No Does the patient have difficulty seeing, even when wearing glasses/contacts?: No Does the patient have difficulty concentrating, remembering, or making decisions?: No  Permission Sought/Granted                  Emotional Assessment Appearance:: Appears stated age     Orientation: : Oriented to Self, Oriented to Place, Oriented to  Time, Oriented to Situation Alcohol / Substance Use: Not Applicable Psych Involvement: No (comment)  Admission diagnosis:  Hyperkalemia [E87.5] Hyponatremia [E87.1] Bilateral leg edema [R60.0] Failure to thrive in adult [R62.7] AKI (acute kidney injury) (HCC) [N17.9] Patient Active Problem List   Diagnosis Date Noted   Acute renal failure superimposed on stage 4 chronic kidney disease (HCC) 08/20/2023   Protein-calorie malnutrition, severe 12/26/2022   Acute GI bleeding 12/24/2022   Coagulopathy (HCC) 12/24/2022   Melena 12/24/2022   Duodenitis 12/24/2022   Noninfectious gastroenteritis 12/24/2022   CAD, multiple vessel 12/24/2022   Acute urinary retention 08/28/2022   Nausea and vomiting  08/27/2022   Hypoalbuminemia due to protein-calorie malnutrition (HCC) 08/27/2022   Type 2 diabetes mellitus with hyperglycemia (HCC) 08/27/2022   Palliative care encounter    GI bleeding 07/08/2022   Acute on chronic anemia 07/08/2022   History of Clostridioides difficile colitis 07/08/2022   DNR (do not resuscitate)/DNI(Do Not Intubate) 07/08/2022   Anemia of chronic disease 07/07/2022   Pressure injury of skin 06/23/2022   UTI (urinary tract  infection) 06/22/2022   Joint pain 06/09/2022   Parietoalveolar pneumopathy (HCC) 06/09/2022   Primary osteoarthritis 06/09/2022   Rheumatoid factor positive 06/09/2022   C. difficile colitis 04/19/2022   PAH (pulmonary artery hypertension) (HCC) 04/18/2022   Emphysematous cystitis 04/13/2022   Female bladder prolapse 04/13/2022   Diverticulitis 04/12/2022   Hyperkalemia 02/04/2022   Autoimmune hemolytic anemia (HCC) 01/02/2022   Bilateral lower extremity edema 01/02/2022   Refractory anemia (HCC) 12/15/2021   Supratherapeutic INR 12/15/2021   Elevated troponin 12/15/2021   Unilateral primary osteoarthritis, right hip 05/31/2021   Midline cystocele 05/30/2021   Primary localized osteoarthritis of pelvic region and thigh 05/30/2021   Vaginal pain 05/30/2021   Asymmetric SNHL (sensorineural hearing loss) 01/06/2021   Referred otalgia of both ears 01/06/2021   Bilateral hearing loss 08/31/2020   Low back pain 02/20/2020   CKD (chronic kidney disease), stage IV (HCC) 10/13/2019   Leukopenia due to antineoplastic chemotherapy (HCC) 08/18/2019   Arteritis (HCC) 06/06/2019   Hypertensive heart and chronic kidney disease with heart failure and stage 1 through stage 4 chronic kidney disease, or unspecified chronic kidney disease (HCC) 06/06/2019   Muscle weakness 06/06/2019   Pancytopenia (HCC) 05/28/2019   Dyspnea 05/28/2019   Vasculitis, ANCA positive 05/28/2019   Disorder of connective tissue (HCC) 05/14/2019   Renal insufficiency 05/08/2019   PAF (paroxysmal atrial fibrillation) (HCC) 04/07/2019   IDA (iron deficiency anemia) 02/28/2019   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 (HCC) 02/28/2019   Diabetic retinopathy associated with type 2 diabetes mellitus (HCC) 09/02/2018   Chronic pain 06/19/2018   Non-thrombocytopenic purpura (HCC) 05/21/2018   Gout 03/22/2018   Malnutrition of moderate degree 03/12/2018   Diabetes mellitus type 2, controlled (HCC)  03/06/2018   Chronic atrial fibrillation (HCC) 03/06/2018   H/O mechanical aortic valve replacement 03/06/2018   Moderate to severe pulmonary hypertension (HCC) 03/06/2018   GERD without esophagitis 02/21/2018   Anxiety 02/21/2018   Chronic diastolic CHF (congestive heart failure) (HCC) 02/21/2018   General weakness 02/02/2018   Thrombocytopenia (HCC) 01/25/2018   Aortic atherosclerosis (HCC) 01/23/2018   Age-related osteoporosis without current pathological fracture 05/24/2017   Carotid artery occlusion 05/24/2017   Generalized anxiety disorder 05/24/2017   Hearing loss 05/24/2017   Presence of prosthetic heart valve 05/24/2017   Carotid artery disease (HCC) 08/18/2013   Peripheral arterial disease (HCC) 08/18/2013   Diabetes mellitus without complication (HCC) 08/18/2013   Hyponatremia 10/06/2011   Essential hypertension    Hypercholesteremia    Chronic anticoagulation    PCP:  [name], [name], MD Pharmacy:   [facility] - [place], [place] - [address] [place] [address] Phone: [phone] Fax: [phone]  [id number] - [place], [place] - [address] [facility] [address]  [place] [address] Phone: [phone] Fax: [phone]     Social Determinants of Health (SDOH) Social History: SDOH Screenings   Food Insecurity: No Food Insecurity (08/20/2023)  Housing: Patient Declined (08/20/2023)  Transportation Needs: No Transportation Needs (08/20/2023)  Utilities:  Not At Risk (08/20/2023)  Alcohol Screen: Low Risk  (02/13/2022)  Depression (PHQ2-9): Low Risk  (07/20/2023)  Financial Resource Strain: Low Risk  (02/28/2022)  Physical Activity: Inactive (02/28/2022)  Social Connections: Moderately Integrated (06/22/2022)  Stress: No Stress Concern Present (02/28/2022)  Tobacco Use: Medium Risk (08/20/2023)   SDOH Interventions:     Readmission Risk Interventions    08/21/2023   10:26 AM  01/14/2023   12:20 PM 12/25/2022    9:36 AM  Readmission Risk Prevention Plan  Transportation Screening Complete Complete Complete  Medication Review [employer]) Complete Complete Complete  HRI or Home Care Consult Complete Complete Complete  SW Recovery Care/Counseling Consult Complete Complete Complete  Palliative Care Screening Not Applicable Not Applicable Not Applicable  Skilled Nursing Facility Not Applicable Not Applicable Not Applicable

## 2023-08-21 NOTE — Consult Note (Signed)
Consultation Note Date: 08/21/2023   Patient Name: [name]  DOB: [birth year]  MRN: [id number]  Age / Sex: 82 y.o., female  PCP: [name], [name] Koh, MD Referring Physician: Kendell Bane, MD  Reason for Consultation: Establishing goals of care  HPI/Patient Profile: 82 y.o. female  with past medical history of pulmonary hypertension, A-fib, history of GI bleed, PAD, DM2, CKD 4, diastolic heart failure, AIHA, mechanical aortic valve, recent hospital stay with discharge on 10/31 for cystitis with Klebsiella admitted on 08/20/2023 with acute on chronic kidney disease, not a candidate for dialysis..   Clinical Assessment and Goals of Care: I have reviewed medical records including EPIC notes, labs and imaging, received report from RN, assessed the patient.  [name] is resting quietly in bed in the intensive care.  She appears acutely/chronically ill and quite frail.  She greets me, making and mostly keeping eye contact.  She is alert and oriented, able to make her needs known.  There is no family at bedside at this time.   We meet at the bedside to discuss diagnosis prognosis, GOC, EOL wishes, disposition and options.  I introduced Palliative Medicine as specialized medical care for people living with serious illness. It focuses on providing relief from the symptoms and stress of a serious illness. The goal is to improve quality of life for both the patient and the family.  We discussed a brief life review of the patient.  [name] states that she has moved in with her daughter because of her care needs.  She tells me that she is active with outpatient palliative services.  We then focused on their current illness.  We talk about her kidney issues and her visit with nephrologist.  I reassured her that statistics show that someone her age lives almost as long without dialysis as those to initiate dialysis.   She shares her worries.  I encouraged her to take as needed anxiety medications.  The natural disease trajectory and expectations at EOL were discussed.  Advanced directives, concepts specific to code status, artifical feeding and hydration, and rehospitalization were considered and discussed.  During our meeting in March of this year [name] endorsed DNR.  When questioned today she readily endorsed DNR.  Hospice and Palliative Care services outpatient were explained and offered.  [name] tells me that she is active with [facility] for outpatient palliative services.  We talked about the benefits of increasing care to include "treat the treatable" hospice care.  She is agreeable.   Discussed the importance of continued conversation with family and the medical providers regarding overall plan of care and treatment options, ensuring decisions are within the context of the patient's values and GOCs.  Questions and concerns were addressed.  The patient was encouraged to call with questions or concerns.  PMT will continue to support holistically.  Conference with attending, bedside nursing staff, transition of care team related to patient condition, needs, goals of care, disposition.   HCPOA  NEXT OF KIN -[name] shares  that she would like for her 3 children to make choices as a team if she were unable.    SUMMARY OF RECOMMENDATIONS   At this point continue to treat the treatable but no CPR or intubation Understands that she is not a candidate for hemodialysis. Active with [facility],  requesting "treat the treatable "hospice care for increased benefits.   Code Status/Advance Care Planning: DNR -verified with patient.  Symptom Management:  Per hospitalist, no additional needs at this time.  Palliative Prophylaxis:  Frequent Pain Assessment and Oral Care  Additional Recommendations (Limitations, Scope, Preferences): Continue to treat but no CPR or intubation, understands that she  is not a candidate for hemodialysis  Psycho-social/Spiritual:  Desire for further Chaplaincy support:no Additional Recommendations: Caregiving  Support/Resources and Education on Hospice  Prognosis:  < 6 months, would not be surprising based on chronic illness burden, decreasing functional status, decreasing kidney function.  Discharge Planning: Anticipate home with [facility] outpatient palliative services, already in place, transitioning to "treat the treatable" hospice care relatively soon      Primary Diagnoses: Present on Admission:  Acute renal failure superimposed on stage 4 chronic kidney disease (HCC)  Bilateral lower extremity edema  Chronic atrial fibrillation (HCC)  Chronic diastolic CHF (congestive heart failure) (HCC)  CAD, multiple vessel  Acute on chronic anemia  Anemia of chronic disease  Anxiety  DNR (do not resuscitate)/DNI(Do Not Intubate)  Essential hypertension  Hypercholesteremia  Hypoalbuminemia due to protein-calorie malnutrition (HCC)  PAH (pulmonary artery hypertension) (HCC)  (Resolved) Thrombophilia (HCC)  Thrombocytopenia (HCC)  Hyponatremia  Hyperkalemia   I have reviewed the medical record, interviewed the patient and family, and examined the patient. The following aspects are pertinent.  Past Medical History:  Diagnosis Date   Aortic atherosclerosis (HCC) 01/23/2018   Atrial fibrillation (HCC)    Benign positional vertigo 10/06/2011   Carotid artery disease (HCC)    Chemotherapy induced neutropenia (HCC) 07/21/2019   Cholelithiasis 01/23/2018   Cholelithiasis 01/23/2018   Chronic anticoagulation    Chronic kidney failure, stage 4 (severe) (HCC)    Colovesical fistula, ruled out 04/14/2022   Coronary artery disease    status post coronary artery bypass grafting times 07/10/2004   Diabetes mellitus    Hypercholesteremia    Hypertension    Mechanical heart valve present    H. aortic valve replacement at the time of bypass surgery October  2005   Moderate to severe pulmonary hypertension (HCC)    Peripheral arterial disease (HCC)    history of left common iliac artery PTA and stenting for a chronic total occlusion 08/26/01   S/P cholecystectomy 03/06/2018   Social History   Socioeconomic History   Marital status: Widowed    Spouse name: Not on file   Number of children: Not on file   Years of education: Not on file   Highest education level: Not on file  Occupational History   Occupation: Retired  Tobacco Use   Smoking status: Former    Current packs/day: 1.00    Average packs/day: 1 pack/day for 30.0 years (30.0 ttl pk-yrs)    Types: Cigarettes   Smokeless tobacco: Never   Tobacco comments:    quit smoking 2005  Vaping Use   Vaping status: Never Used  Substance and Sexual Activity   Alcohol use: No    Alcohol/week: 0.0 standard drinks of alcohol   Drug use: No   Sexual activity: Not Currently    Birth control/protection: None  Other Topics Concern  Not on file  Social History Narrative   Not on file   Social Determinants of Health   Financial Resource Strain: Low Risk  (02/28/2022)   Overall Financial Resource Strain (CARDIA)    Difficulty of Paying Living Expenses: Not hard at all  Food Insecurity: No Food Insecurity (08/20/2023)   Hunger Vital Sign    Worried About Running Out of Food in the Last Year: Never true    Ran Out of Food in the Last Year: Never true  Transportation Needs: No Transportation Needs (08/20/2023)   PRAPARE - Administrator, Civil Service (Medical): No    Lack of Transportation (Non-Medical): No  Physical Activity: Inactive (02/28/2022)   Exercise Vital Sign    Days of Exercise per Week: 0 days    Minutes of Exercise per Session: 0 min  Stress: No Stress Concern Present (02/28/2022)   [employer] of Occupational Health - Occupational Stress Questionnaire    Feeling of Stress : Only a little  Social Connections: Moderately Integrated (06/22/2022)   Social  Connection and Isolation Panel [NHANES]    Frequency of Communication with Friends and Family: More than three times a week    Frequency of Social Gatherings with Friends and Family: More than three times a week    Attends Religious Services: More than 4 times per year    Active Member of [employer] or Organizations: Yes    Attends Banker Meetings: More than 4 times per year    Marital Status: Widowed   Family History  Problem Relation Age of Onset   Heart disease Father    Hypertension Father    Hyperlipidemia Father    Breast cancer Neg Hx    Scheduled Meds:  allopurinol  100 mg Oral Daily   ALPRAZolam  0.5 mg Oral QHS   amLODipine  10 mg Oral QHS   atorvastatin  80 mg Oral q1800   budesonide  0.5 mg Nebulization BID   Chlorhexidine Gluconate Cloth  6 each Topical Q0600   cloNIDine  0.3 mg Oral BID   famotidine  20 mg Oral BID   ferrous sulfate  650 mg Oral Q breakfast   furosemide  80 mg Intravenous Q12H   hydrALAZINE  100 mg Oral TID   insulin aspart  0-9 Units Subcutaneous TID WC   insulin glargine-yfgn  5 Units Subcutaneous QHS   ondansetron (ZOFRAN) IV  4 mg Intravenous Once   pantoprazole  40 mg Oral BID WC   sertraline  25 mg Oral QHS   sodium bicarbonate  650 mg Oral BID   sodium chloride flush  3 mL Intravenous Q12H   sodium chloride flush  3 mL Intravenous Q12H   sodium zirconium cyclosilicate  10 g Oral Daily   tadalafil (PAH)  40 mg Oral QHS   Continuous Infusions:  sodium chloride     albumin human 50 g (08/21/23 1119)   PRN Meds:.sodium chloride, acetaminophen **OR** acetaminophen, hydrALAZINE, HYDROmorphone (DILAUDID) injection, ipratropium, levalbuterol, ondansetron **OR** ondansetron (ZOFRAN) IV, oxyCODONE, senna-docusate, sodium chloride flush, sodium phosphate, traZODone Medications Prior to Admission:  Prior to Admission medications   Medication Sig Start Date End Date Taking? Authorizing Provider  acetaminophen (TYLENOL) 500 MG tablet Take  500-1,000 mg by mouth every 6 (six) hours as needed for mild pain (pain score 1-3) or fever.   Yes [provider]  allopurinol (ZYLOPRIM) 100 MG tablet Take 100 mg by mouth daily.   Yes [provider]  ALPRAZolam (  XANAX) 0.5 MG tablet Take 0.5 mg by mouth at bedtime.   Yes [provider]  amLODipine (NORVASC) 10 MG tablet Take 1 tablet (10 mg total) by mouth daily. Patient taking differently: Take 10 mg by mouth at bedtime. 07/30/23  Yes Alen Bleacher, NP  aspirin EC 81 MG tablet Take 81 mg by mouth daily. Swallow whole.   Yes [provider]  atorvastatin (LIPITOR) 80 MG tablet Take 1 tablet (80 mg total) by mouth daily at 6 PM. 11/22/18  Yes Alekh, Kshitiz, MD  budesonide (PULMICORT) 0.5 MG/2ML nebulizer solution Take 2 mLs (0.5 mg total) by nebulization 2 (two) times daily. 08/09/23  Yes Shah, Pratik D, DO  Cholecalciferol (VITAMIN D3) 50 MCG (2000 UT) capsule Take 2,000 Units by mouth daily with lunch. 11/26/18  Yes [provider]  cloNIDine (CATAPRES) 0.3 MG tablet Take 0.3 mg by mouth 2 (two) times daily. 08/11/23  Yes [provider]  Cyanocobalamin (VITAMIN B12) 1000 MCG TBCR Take 1,000 mcg by mouth daily with lunch.   Yes [provider]  famotidine (PEPCID) 20 MG tablet Take 20 mg by mouth 2 (two) times daily.   Yes [provider]  ferrous sulfate 325 (65 FE) MG tablet Take 1 tablet (325 mg total) by mouth daily with breakfast. Patient taking differently: Take 650 mg by mouth daily. Lunch time. 09/06/22  Yes Johnson, Clanford L, MD  hydrALAZINE (APRESOLINE) 100 MG tablet Take 1 tablet (100 mg total) by mouth 3 (three) times daily. 12/31/22  Yes Emokpae, Courage, MD  insulin aspart (NOVOLOG) 100 UNIT/ML injection Inject 2-8 Units into the skin 3 (three) times daily with meals. Per sliding scale, If blood sugar is 200 to 250, give 2 units. If blood sugar is 251 to 300, give 4 units. If blood sugar is 301 to 350, give 6  units. If blood sugar is greater than 350, give 8 units and call MD. 09/05/22  Yes Johnson, Clanford L, MD  ipratropium-albuterol (DUONEB) 0.5-2.5 (3) MG/3ML SOLN Take 3 mLs by nebulization every 6 (six) hours as needed. 08/09/23  Yes Shah, Pratik D, DO  ondansetron (ZOFRAN-ODT) 4 MG disintegrating tablet Take 4 mg by mouth every 8 (eight) hours as needed for nausea or vomiting. 01/12/22  Yes [provider]  pantoprazole (PROTONIX) 40 MG tablet Take 1 tablet (40 mg total) by mouth 2 (two) times daily. 12/31/22  Yes Emokpae, Courage, MD  senna-docusate (SENOKOT-S) 8.6-50 MG tablet Take 2 tablets by mouth at bedtime. Patient taking differently: Take 2 tablets by mouth at bedtime as needed for mild constipation. 12/31/22  Yes Emokpae, Courage, MD  sertraline (ZOLOFT) 25 MG tablet Take 25 mg by mouth at bedtime.   Yes [provider]  simethicone (GAS-X) 80 MG chewable tablet Chew 1 tablet (80 mg total) by mouth 4 (four) times daily as needed for flatulence. 04/16/22 06/25/24 Yes Danford, Earl Lites, MD  sodium bicarbonate 650 MG tablet Take 1 tablet (650 mg total) by mouth 2 (two) times daily. 08/09/23 09/08/23 Yes Shah, Pratik D, DO  spironolactone (ALDACTONE) 25 MG tablet TAKE 1 TABLET EVERY DAY Patient taking differently: Take 25 mg by mouth at bedtime. 05/14/23  Yes Laurey Morale, MD  tadalafil, PAH, (ADCIRCA) 20 MG tablet TAKE 2 TABLETS BY MOUTH ONCE DAILY Patient taking differently: Take 40 mg by mouth at bedtime. 12/11/22  Yes Laurey Morale, MD  warfarin (COUMADIN) 5 MG tablet Take 1 tablet (5 mg total) by mouth See admin instructions.  Take 1 tablet 5mg  on Tuesday/Thursday/Sunday and 1/2 Tab (2.5 mg) on MWFSat Patient taking differently: Take 5 mg by mouth See admin instructions. Take 1 tablet every day [facility] Visit on Wednesday 12/31/22  Yes Emokpae, Courage, MD  enoxaparin (LOVENOX) 60 MG/0.6ML injection Inject 0.5 mLs (50 mg total) into the skin daily  for 7 days. Patient not taking: Reported on 08/20/2023 08/10/23 08/17/23  Sherryll Burger, Pratik D, DO  furosemide (LASIX) 40 MG tablet TAKE 2 TABLETS IN MORNING AND 1 AND 1/2 TABLETS IN EVENING. CHANGE IN DOSAGE. 08/20/23   Alen Bleacher, NP  Insulin Syringe-Needle U-100 (B-D INS SYR ULTRAFINE .3CC/29G) 29G X 1/2" 0.3 ML MISC Use for sliding scale for breakthrough high blood sugar readings 02/24/22   Rachel Moulds, MD  metoprolol succinate (TOPROL-XL) 25 MG 24 hr tablet TAKE 1/2 TABLET EVERY DAY 08/20/23   Alen Bleacher, NP  nitrofurantoin (MACRODANTIN) 100 MG capsule Take 100 mg by mouth 2 (two) times daily. Patient not taking: Reported on 08/20/2023 07/12/23   [provider]   Allergies  Allergen Reactions   Amlodipine Swelling   Amoxicillin-Pot Clavulanate Diarrhea, Nausea And Vomiting and Other (See Comments)   Carvedilol Other (See Comments)    Terrible cramping in the feet and had a lot of bowel movements, but not diarrhea   Chlorthalidone Other (See Comments)   Ciprofloxacin Diarrhea, Nausea Only and Other (See Comments)    Stomach ache   Codeine Other (See Comments)    Insomnia, anxiety   Escitalopram Other (See Comments)    Other Reaction(s): made head feel funny   Ezetimibe Other (See Comments)    Reaction not recalled   Fluconazole Diarrhea and Other (See Comments)   Immune Globulin (Human) Other (See Comments)    Severe/excruciating pain   Levofloxacin In D5w     Vomiting, diarrhea, insomnia   Losartan Other (See Comments) and Swelling    Patient doesn't recall site of swelling   Lovastatin     Other Reaction(s): do not remember   Niacin     Other Reaction(s): made her feel hot all over   Sertraline     Other Reaction(s): made head feel funny   Sulfamethoxazole-Trimethoprim Diarrhea, Nausea Only and Other (See Comments)    Stomach upset   Verapamil Hives and Other (See Comments)   Zocor [Simvastatin - High Dose] Other (See Comments)    Reaction not recalled    Gatifloxacin Other (See Comments) and Rash    Redness to skin around eye   Lovenox [Enoxaparin] Swelling and Rash    Swelling of legs and rash all over   Simvastatin Other (See Comments) and Rash   Review of Systems  Unable to perform ROS: Age    Physical Exam Vitals and nursing note reviewed.  Constitutional:      General: She is not in acute distress.    Appearance: She is ill-appearing.  Cardiovascular:     Rate and Rhythm: Normal rate.  Pulmonary:     Effort: Pulmonary effort is normal. No respiratory distress.  Skin:    General: Skin is warm and dry.     Findings: Bruising present.  Neurological:     Mental Status: She is alert and oriented to person, place, and time.  Psychiatric:        Mood and Affect: Mood normal.        Behavior: Behavior normal.     Vital Signs: BP (!) 128/59   Pulse (!) 50  Temp 99.1 F (37.3 C) (Axillary)   Resp 17   Ht 4\' 8"  (1.422 m)   Wt 54.9 kg   SpO2 96%   BMI 27.13 kg/m  Pain Scale: CPOT   Pain Score: 0-No pain   SpO2: SpO2: 96 % O2 Device:SpO2: 96 % O2 Flow Rate: .O2 Flow Rate (L/min): 1 L/min  IO: Intake/output summary:  Intake/Output Summary (Last 24 hours) at 08/21/2023 1422 Last data filed at 08/21/2023 0747 Gross per 24 hour  Intake 340 ml  Output 500 ml  Net -160 ml    LBM:   Baseline Weight: Weight: 49.9 kg Most recent weight: Weight: 54.9 kg     Palliative Assessment/Data:     Time In: 0830 Time Out: 0945 Time Total: 75 minutes  Greater than 50%  of this time was spent counseling and coordinating care related to the above assessment and plan.  Signed by: Katheran Awe, NP   Please contact Palliative Medicine Team phone at [phone] for questions and concerns.  For individual provider: See Loretha Stapler

## 2023-08-21 NOTE — Consult Note (Signed)
Reason for Consult:AKI/CKD stage IV Referring Physician: Flossie Dibble, MD  [name] is an 82 y.o. female  with a PMH significant for CAD s/p CABG, s/p mechanical AVF, PAD, pulmonary HTN, RV dysfunction, chronic combined systolic and diastolic CHF, HTN, gout, P-ANCA vasculitis (diagnosed 2020 and treated with rituximab and cellcept by Oncology), autoimmune hemolytic anemia, atrial fibrillation, and CKD stage IV who presented to [facility] ED on 08/20/23 c/o generalized weakness and fatigue for the past week.  She also noted increased lower extremity edema and decreased UOP prior to admission.  She has also had DOE.  In the ED, Temp 97.4, Bp 158/54, HR 50, SpO2 98%.  Labs notable for Na 129, K 5.4, BUN 88, Cr 4.92, alb 2.9, Hgb 8.4, INR 3.7, BNP 687.  CXR with cardiomegaly with small pleural effusions and diffuse increased interstitial and mild ground glass opacities suspicious for edema.  She was admitted for acute on chronic CHF and we were consulted to further evaluate and manage her AKI/CKD stage IV.  The trend in Scr is seen below.  Of note, she was admitted to [facility] from 10/27-10/31/24 with emphysematous cystitis due to ESBL Klebsiella complicated by AKI/CKD stage IV and supratherapeutic INR.  She did restart furosemide after discharge, however was contacted by advanced heart failure team on 08/16/23 to hold lasix due to elevated Scr.  Trend in Creatinine:  Creatinine, Ser  Date/Time Value Ref Range Status  08/21/2023 05:12 AM 5.27 (H) 0.44 - 1.00 mg/dL Final  16/07/9603 54:09 PM 4.92 (H) 0.44 - 1.00 mg/dL Final  81/19/1478 29:56 AM 3.22 (H) 0.44 - 1.00 mg/dL Final  21/30/8657 84:69 PM 2.95 (H) 0.44 - 1.00 mg/dL Final  62/95/2841 32:44 AM 1.82 (H) 0.44 - 1.00 mg/dL Final  10/11/7251 66:44 AM 2.12 (H) 0.44 - 1.00 mg/dL Final  03/47/4259 56:38 AM 2.46 (H) 0.44 - 1.00 mg/dL Final  75/64/3329 51:88 AM 3.19 (H) 0.44 - 1.00 mg/dL Final  41/66/0630 16:01 PM 3.49 (H) 0.44 - 1.00 mg/dL Final  09/32/3557  32:20 PM 3.23 (H) 0.44 - 1.00 mg/dL Final  25/42/7062 37:62 AM 2.78 (H) 0.44 - 1.00 mg/dL Final  83/15/1761 60:73 AM 2.56 (H) 0.44 - 1.00 mg/dL Final  71/03/2693 85:46 AM 2.72 (H) 0.44 - 1.00 mg/dL Final  27/12/5007 38:18 PM 3.34 (H) 0.44 - 1.00 mg/dL Final  29/93/7169 67:89 AM 2.79 (H) 0.44 - 1.00 mg/dL Final  38/07/1750 02:58 AM 3.22 (H) 0.44 - 1.00 mg/dL Final  52/77/8242 35:36 PM 2.84 (H) 0.44 - 1.00 mg/dL Final  14/43/1540 08:67 PM 3.46 (H) 0.44 - 1.00 mg/dL Final  61/95/0932 67:12 PM 3.01 (H) 0.44 - 1.00 mg/dL Final  45/80/9983 38:25 PM 2.97 (H) 0.44 - 1.00 mg/dL Final  05/39/7673 41:93 PM 2.94 (H) 0.44 - 1.00 mg/dL Final  79/11/4095 35:32 PM 2.52 (H) 0.44 - 1.00 mg/dL Final  99/24/2683 41:96 PM 2.37 (H) 0.44 - 1.00 mg/dL Final  22/29/7989 21:19 PM 2.50 (H) 0.44 - 1.00 mg/dL Final  41/74/0814 48:18 AM 1.73 (H) 0.44 - 1.00 mg/dL Final  56/31/4970 26:37 AM 1.55 (H) 0.44 - 1.00 mg/dL Final  85/88/5027 74:12 AM 1.51 (H) 0.44 - 1.00 mg/dL Final  87/86/7672 09:47 AM 1.56 (H) 0.44 - 1.00 mg/dL Final  09/62/8366 29:47 AM 1.61 (H) 0.44 - 1.00 mg/dL Final  65/46/5035 46:56 AM 2.32 (H) 0.44 - 1.00 mg/dL Final  81/27/5170 01:74 AM 2.35 (H) 0.44 - 1.00 mg/dL Final  94/49/6759 16:38 AM 2.31 (H) 0.44 - 1.00 mg/dL Final  46/65/9935 70:17  AM 2.40 (H) 0.44 - 1.00 mg/dL Final  16/07/9603 54:09 PM 2.73 (H) 0.44 - 1.00 mg/dL Final  81/19/1478 29:56 PM 2.22 (H) 0.44 - 1.00 mg/dL Final  21/30/8657 84:69 AM 1.49 (H) 0.44 - 1.00 mg/dL Final  62/95/2841 32:44 AM 1.59 (H) 0.44 - 1.00 mg/dL Final  10/11/7251 66:44 AM 1.73 (H) 0.44 - 1.00 mg/dL Final  03/47/4259 56:38 AM 1.63 (H) 0.44 - 1.00 mg/dL Final  75/64/3329 51:88 AM 1.78 (H) 0.44 - 1.00 mg/dL Final  41/66/0630 16:01 AM 2.06 (H) 0.44 - 1.00 mg/dL Final  09/32/3557 32:20 PM 2.40 (H) 0.44 - 1.00 mg/dL Final  25/42/7062 37:62 AM 2.84 (H) 0.44 - 1.00 mg/dL Final  83/15/1761 60:73 PM 2.60 (H) 0.44 - 1.00 mg/dL Final  71/03/2693 85:46 AM 2.25 (H) 0.44 -  1.00 mg/dL Final  27/12/5007 38:18 PM 2.74 (H) 0.44 - 1.00 mg/dL Final  29/93/7169 67:89 PM 2.55 (H) 0.44 - 1.00 mg/dL Final  38/07/1750 02:58 PM 2.35 (H) 0.44 - 1.00 mg/dL Final  52/77/8242 35:36 AM 2.57 (H) 0.44 - 1.00 mg/dL Final  14/43/1540 08:67 AM 2.50 (H) 0.44 - 1.00 mg/dL Final  61/95/0932 67:12 AM 2.27 (H) 0.44 - 1.00 mg/dL Final  45/80/9983 38:25 AM 2.20 (H) 0.44 - 1.00 mg/dL Final  05/39/7673 41:93 AM 2.51 (H) 0.44 - 1.00 mg/dL Final   Creatinine  Date/Time Value Ref Range Status  08/02/2023 11:28 AM 3.37 (H) 0.44 - 1.00 mg/dL Final  79/11/4095 35:32 PM 2.32 (H) 0.44 - 1.00 mg/dL Final  99/24/2683 41:96 PM 2.31 (H) 0.44 - 1.00 mg/dL Final  22/29/7989 21:19 PM 2.33 (H) 0.44 - 1.00 mg/dL Final  41/74/0814 48:18 PM 2.25 (H) 0.44 - 1.00 mg/dL Final  56/31/4970 26:37 AM 2.44 (H) 0.44 - 1.00 mg/dL Final  85/88/5027 74:12 AM 1.93 (H) 0.44 - 1.00 mg/dL Final  87/86/7672 09:47 AM 2.05 (H) 0.44 - 1.00 mg/dL Final  09/62/8366 29:47 PM 2.21 (H) 0.44 - 1.00 mg/dL Final  65/46/5035 46:56 AM 2.33 (H) 0.44 - 1.00 mg/dL Final  81/27/5170 01:74 AM 1.90 (H) 0.44 - 1.00 mg/dL Final  94/49/6759 16:38 PM 2.39 (H) 0.44 - 1.00 mg/dL Final  46/65/9935 70:17 PM 2.42 (H) 0.44 - 1.00 mg/dL Final  79/39/0300 92:33 AM 2.34 (H) 0.44 - 1.00 mg/dL Final  00/76/2263 33:54 AM 2.51 (H) 0.44 - 1.00 mg/dL Final  56/25/6389 37:34 AM 2.41 (H) 0.44 - 1.00 mg/dL Final  28/76/8115 72:62 PM 2.66 (H) 0.44 - 1.00 mg/dL Final  03/55/9741 63:84 AM 2.34 (H) 0.44 - 1.00 mg/dL Final  53/64/6803 21:22 PM 2.55 (H) 0.44 - 1.00 mg/dL Final  48/25/0037 04:88 AM 2.48 (H) 0.44 - 1.00 mg/dL Final  89/16/9450 38:88 PM 2.55 (H) 0.44 - 1.00 mg/dL Final  28/00/3491 79:15 AM 2.41 (H) 0.44 - 1.00 mg/dL Final  05/69/7948 01:65 PM 2.53 (H) 0.44 - 1.00 mg/dL Final  53/74/8270 78:67 AM 2.48 (H) 0.44 - 1.00 mg/dL Final  54/49/2010 07:12 PM 2.42 (H) 0.44 - 1.00 mg/dL Final  19/75/8832 54:98 AM 2.79 (H) 0.44 - 1.00 mg/dL Final  26/41/5830  94:07 AM 2.32 (H) 0.44 - 1.00 mg/dL Final  68/05/8109 31:59 AM 2.53 (H) 0.44 - 1.00 mg/dL Final  45/85/9292 44:62 PM 2.49 (H) 0.44 - 1.00 mg/dL Final  86/38/1771 16:57 AM 2.33 (H) 0.44 - 1.00 mg/dL Final  90/38/3338 32:91 AM 2.93 (H) 0.44 - 1.00 mg/dL Final  91/66/0600 45:99 PM 2.63 (H) 0.44 - 1.00 mg/dL Final  77/41/4239 53:20 AM 2.65 (H) 0.44 - 1.00  mg/dL Final  40/98/1191 47:82 AM 2.35 (H) 0.44 - 1.00 mg/dL Final  95/62/1308 65:78 PM 2.42 (H) 0.44 - 1.00 mg/dL Final  46/96/2952 84:13 AM 2.43 (H) 0.44 - 1.00 mg/dL Final  24/40/1027 25:36 AM 2.46 (H) 0.44 - 1.00 mg/dL Final  64/40/3474 25:95 AM 2.22 (H) 0.44 - 1.00 mg/dL Final  63/87/5643 32:95 AM 2.48 (H) 0.44 - 1.00 mg/dL Final  18/84/1660 63:01 PM 2.35 (H) 0.44 - 1.00 mg/dL Final  60/07/9322 55:73 PM 2.41 (H) 0.44 - 1.00 mg/dL Final  22/11/5425 06:23 AM 2.26 (H) 0.44 - 1.00 mg/dL Final  76/28/3151 76:16 AM 2.29 (H) 0.44 - 1.00 mg/dL Final  07/37/1062 69:48 AM 2.54 (H) 0.44 - 1.00 mg/dL Final  54/62/7035 00:93 PM 2.78 (H) 0.44 - 1.00 mg/dL Final  81/82/9937 16:96 PM 2.50 (H) 0.44 - 1.00 mg/dL Final  78/93/8101 75:10 AM 1.73 (H) 0.44 - 1.00 mg/dL Final  25/85/2778 24:23 AM 2.66 (H) 0.44 - 1.00 mg/dL Final  53/61/4431 54:00 AM 1.85 (H) 0.44 - 1.00 mg/dL Final  86/76/1950 93:26 AM 1.80 (H) 0.44 - 1.00 mg/dL Final  71/24/5809 98:33 AM 1.78 (H) 0.44 - 1.00 mg/dL Final    PMH:   Past Medical History:  Diagnosis Date   Aortic atherosclerosis (HCC) 01/23/2018   Atrial fibrillation (HCC)    Benign positional vertigo 10/06/2011   Carotid artery disease (HCC)    Chemotherapy induced neutropenia (HCC) 07/21/2019   Cholelithiasis 01/23/2018   Cholelithiasis 01/23/2018   Chronic anticoagulation    Chronic kidney failure, stage 4 (severe) (HCC)    Colovesical fistula, ruled out 04/14/2022   Coronary artery disease    status post coronary artery bypass grafting times 07/10/2004   Diabetes mellitus    Hypercholesteremia    Hypertension     Mechanical heart valve present    H. aortic valve replacement at the time of bypass surgery October 2005   Moderate to severe pulmonary hypertension (HCC)    Peripheral arterial disease (HCC)    history of left common iliac artery PTA and stenting for a chronic total occlusion 08/26/01   S/P cholecystectomy 03/06/2018    PSH:   Past Surgical History:  Procedure Laterality Date   anterior repair  2009   AORTIC VALVE REPLACEMENT  2005   Dr. Laneta Simmers   CARDIAC CATHETERIZATION  11/10/2004   40% right common illiac, 70% in stent restenosis of distal left common illiac,    CARDIAC CATHETERIZATION  05/18/2004   LAD 50-70% midstenosis, RCA dominant w/50% stenosis, 50% Right common Illiac artery ostial stenosis, 90% in stent restenosis within midportion of left common illiac stent   Carotid Duplex  03/12/2012   RSA-elev. velocities suggestive of a 50-69% diameter reduction, Right&Left Bulb/Prox ICA-mild-mod.fibrous plaqueelevating Velocities abnormal study.   CHOLECYSTECTOMY N/A 03/01/2018   Procedure: LAPAROSCOPIC CHOLECYSTECTOMY;  Surgeon: Jimmye Norman, MD;  Location: [facility] OR;  Service: General;  Laterality: N/A;   COLONOSCOPY WITH PROPOFOL N/A 01/22/2018   Procedure: COLONOSCOPY WITH PROPOFOL;  Surgeon: Charlott Rakes, MD;  Location: [facility] ENDOSCOPY;  Service: Endoscopy;  Laterality: N/A;   ESOPHAGOGASTRODUODENOSCOPY N/A 07/13/2022   Procedure: ESOPHAGOGASTRODUODENOSCOPY (EGD);  Surgeon: Kerin Salen, MD;  Location: [facility] ENDOSCOPY;  Service: Gastroenterology;  Laterality: N/A;   ESOPHAGOGASTRODUODENOSCOPY (EGD) WITH PROPOFOL N/A 01/22/2018   Procedure: ESOPHAGOGASTRODUODENOSCOPY (EGD) WITH PROPOFOL;  Surgeon: Charlott Rakes, MD;  Location: [facility] ENDOSCOPY;  Service: Endoscopy;  Laterality: N/A;   ESOPHAGOGASTRODUODENOSCOPY (EGD) WITH PROPOFOL N/A 11/21/2018   Procedure: ESOPHAGOGASTRODUODENOSCOPY (EGD) WITH PROPOFOL;  Surgeon: Kerin Salen, MD;  Location: [facility] ENDOSCOPY;  Service: Gastroenterology;   Laterality: N/A;   ESOPHAGOGASTRODUODENOSCOPY (EGD) WITH PROPOFOL Left 02/27/2019   Procedure: ESOPHAGOGASTRODUODENOSCOPY (EGD) WITH PROPOFOL;  Surgeon: Willis Modena, MD;  Location: [facility] ENDOSCOPY;  Service: Endoscopy;  Laterality: Left;   GIVENS CAPSULE STUDY N/A 11/21/2018   Procedure: GIVENS CAPSULE STUDY;  Surgeon: Kerin Salen, MD;  Location: [facility] ENDOSCOPY;  Service: Gastroenterology;  Laterality: N/A;  To be deployed during EGD   Lower Ext. Duplex  03/12/2012   Right Proximal CIA- vessel narrowing w/elevated velocities 0-49% diameter reduction. Right SFA-mild mixed density plaque throughout vessel.   NM MYOCAR PERF WALL MOTION  05/19/2010   protocol: Persantine, post stress EF 65%, negative for ischemia, low risk scan   RIGHT HEART CATH N/A 06/27/2018   Procedure: RIGHT HEART CATH;  Surgeon: Laurey Morale, MD;  Location: [facility] INVASIVE CV LAB;  Service: Cardiovascular;  Laterality: N/A;   TOTAL ABDOMINAL HYSTERECTOMY W/ BILATERAL SALPINGOOPHORECTOMY  1989   TRANSTHORACIC ECHOCARDIOGRAM  08/29/2012   Moderately calcified annulus of mitral valve, moderate regurg. of both mitral valve and tricuspid valve.     Allergies:  Allergies  Allergen Reactions   Amlodipine Swelling   Amoxicillin-Pot Clavulanate Diarrhea, Nausea And Vomiting and Other (See Comments)   Carvedilol Other (See Comments)    Terrible cramping in the feet and had a lot of bowel movements, but not diarrhea   Chlorthalidone Other (See Comments)   Ciprofloxacin Diarrhea, Nausea Only and Other (See Comments)    Stomach ache   Codeine Other (See Comments)    Insomnia, anxiety   Escitalopram Other (See Comments)    Other Reaction(s): made head feel funny   Ezetimibe Other (See Comments)    Reaction not recalled   Fluconazole Diarrhea and Other (See Comments)   Immune Globulin (Human) Other (See Comments)    Severe/excruciating pain   Levofloxacin In D5w     Vomiting, diarrhea, insomnia   Losartan Other (See Comments) and  Swelling    Patient doesn't recall site of swelling   Lovastatin     Other Reaction(s): do not remember   Niacin     Other Reaction(s): made her feel hot all over   Sertraline     Other Reaction(s): made head feel funny   Sulfamethoxazole-Trimethoprim Diarrhea, Nausea Only and Other (See Comments)    Stomach upset   Verapamil Hives and Other (See Comments)   Zocor [Simvastatin - High Dose] Other (See Comments)    Reaction not recalled   Gatifloxacin Other (See Comments) and Rash    Redness to skin around eye   Lovenox [Enoxaparin] Swelling and Rash    Swelling of legs and rash all over   Simvastatin Other (See Comments) and Rash    Medications:   Prior to Admission medications   Medication Sig Start Date End Date Taking? Authorizing Provider  acetaminophen (TYLENOL) 500 MG tablet Take 500-1,000 mg by mouth every 6 (six) hours as needed for mild pain (pain score 1-3) or fever.   Yes [provider]  allopurinol (ZYLOPRIM) 100 MG tablet Take 100 mg by mouth daily.   Yes [provider]  ALPRAZolam Prudy Feeler) 0.5 MG tablet Take 0.5 mg by mouth at bedtime.   Yes [provider]  amLODipine (NORVASC) 10 MG tablet Take 1 tablet (10 mg total) by mouth daily. Patient taking differently: Take 10 mg by mouth at bedtime. 07/30/23  Yes Alen Bleacher, NP  aspirin EC 81 MG tablet Take 81 mg by mouth daily.  Swallow whole.   Yes [provider]  atorvastatin (LIPITOR) 80 MG tablet Take 1 tablet (80 mg total) by mouth daily at 6 PM. 11/22/18  Yes Alekh, Kshitiz, MD  budesonide (PULMICORT) 0.5 MG/2ML nebulizer solution Take 2 mLs (0.5 mg total) by nebulization 2 (two) times daily. 08/09/23  Yes Shah, Pratik D, DO  Cholecalciferol (VITAMIN D3) 50 MCG (2000 UT) capsule Take 2,000 Units by mouth daily with lunch. 11/26/18  Yes [provider]  cloNIDine (CATAPRES) 0.3 MG tablet Take 0.3 mg by mouth 2 (two) times daily. 08/11/23  Yes [provider]   Cyanocobalamin (VITAMIN B12) 1000 MCG TBCR Take 1,000 mcg by mouth daily with lunch.   Yes [provider]  famotidine (PEPCID) 20 MG tablet Take 20 mg by mouth 2 (two) times daily.   Yes [provider]  ferrous sulfate 325 (65 FE) MG tablet Take 1 tablet (325 mg total) by mouth daily with breakfast. Patient taking differently: Take 650 mg by mouth daily. Lunch time. 09/06/22  Yes Johnson, Clanford L, MD  hydrALAZINE (APRESOLINE) 100 MG tablet Take 1 tablet (100 mg total) by mouth 3 (three) times daily. 12/31/22  Yes Emokpae, Courage, MD  insulin aspart (NOVOLOG) 100 UNIT/ML injection Inject 2-8 Units into the skin 3 (three) times daily with meals. Per sliding scale, If blood sugar is 200 to 250, give 2 units. If blood sugar is 251 to 300, give 4 units. If blood sugar is 301 to 350, give 6 units. If blood sugar is greater than 350, give 8 units and call MD. 09/05/22  Yes Johnson, Clanford L, MD  ipratropium-albuterol (DUONEB) 0.5-2.5 (3) MG/3ML SOLN Take 3 mLs by nebulization every 6 (six) hours as needed. 08/09/23  Yes Shah, Pratik D, DO  ondansetron (ZOFRAN-ODT) 4 MG disintegrating tablet Take 4 mg by mouth every 8 (eight) hours as needed for nausea or vomiting. 01/12/22  Yes [provider]  pantoprazole (PROTONIX) 40 MG tablet Take 1 tablet (40 mg total) by mouth 2 (two) times daily. 12/31/22  Yes Emokpae, Courage, MD  senna-docusate (SENOKOT-S) 8.6-50 MG tablet Take 2 tablets by mouth at bedtime. Patient taking differently: Take 2 tablets by mouth at bedtime as needed for mild constipation. 12/31/22  Yes Emokpae, Courage, MD  sertraline (ZOLOFT) 25 MG tablet Take 25 mg by mouth at bedtime.   Yes [provider]  simethicone (GAS-X) 80 MG chewable tablet Chew 1 tablet (80 mg total) by mouth 4 (four) times daily as needed for flatulence. 04/16/22 06/25/24 Yes Danford, Earl Lites, MD  sodium bicarbonate 650 MG tablet Take 1 tablet (650 mg total) by mouth 2 (two)  times daily. 08/09/23 09/08/23 Yes Shah, Pratik D, DO  spironolactone (ALDACTONE) 25 MG tablet TAKE 1 TABLET EVERY DAY Patient taking differently: Take 25 mg by mouth at bedtime. 05/14/23  Yes Laurey Morale, MD  tadalafil, PAH, (ADCIRCA) 20 MG tablet TAKE 2 TABLETS BY MOUTH ONCE DAILY Patient taking differently: Take 40 mg by mouth at bedtime. 12/11/22  Yes Laurey Morale, MD  warfarin (COUMADIN) 5 MG tablet Take 1 tablet (5 mg total) by mouth See admin instructions. Take 1 tablet 5mg  on Tuesday/Thursday/Sunday and 1/2 Tab (2.5 mg) on MWFSat Patient taking differently: Take 5 mg by mouth See admin instructions. Take 1 tablet every day unitl visti at [facility]madin Clinic Visit on Wednesday 12/31/22  Yes Emokpae, Courage, MD  enoxaparin (LOVENOX) 60 MG/0.6ML injection Inject 0.5 mLs (50 mg total) into the skin daily for  7 days. Patient not taking: Reported on 08/20/2023 08/10/23 08/17/23  Sherryll Burger, Pratik D, DO  furosemide (LASIX) 40 MG tablet TAKE 2 TABLETS IN MORNING AND 1 AND 1/2 TABLETS IN EVENING. CHANGE IN DOSAGE. 08/20/23   Alen Bleacher, NP  Insulin Syringe-Needle U-100 (B-D INS SYR ULTRAFINE .3CC/29G) 29G X 1/2" 0.3 ML MISC Use for sliding scale for breakthrough high blood sugar readings 02/24/22   Rachel Moulds, MD  metoprolol succinate (TOPROL-XL) 25 MG 24 hr tablet TAKE 1/2 TABLET EVERY DAY 08/20/23   Alen Bleacher, NP  nitrofurantoin (MACRODANTIN) 100 MG capsule Take 100 mg by mouth 2 (two) times daily. Patient not taking: Reported on 08/20/2023 07/12/23   [provider]    Inpatient medications:  allopurinol  100 mg Oral Daily   ALPRAZolam  0.5 mg Oral QHS   amLODipine  10 mg Oral QHS   atorvastatin  80 mg Oral q1800   budesonide  0.5 mg Nebulization BID   Chlorhexidine Gluconate Cloth  6 each Topical Q0600   cloNIDine  0.3 mg Oral BID   famotidine  20 mg Oral BID   ferrous sulfate  650 mg Oral Q breakfast   furosemide  40 mg Intravenous Q12H   hydrALAZINE  100 mg Oral TID    insulin aspart  0-9 Units Subcutaneous TID WC   insulin glargine-yfgn  5 Units Subcutaneous QHS   metoprolol succinate  12.5 mg Oral Daily   ondansetron (ZOFRAN) IV  4 mg Intravenous Once   pantoprazole  40 mg Oral BID WC   sertraline  25 mg Oral QHS   sodium bicarbonate  650 mg Oral BID   sodium chloride flush  3 mL Intravenous Q12H   sodium chloride flush  3 mL Intravenous Q12H   sodium zirconium cyclosilicate  10 g Oral Daily   tadalafil (PAH)  40 mg Oral QHS    Discontinued Meds:   Medications Discontinued During This Encounter  Medication Reason   cloNIDine (CATAPRES) 0.2 MG tablet Change in therapy   hydrocortisone (ANUSOL-HC) 2.5 % rectal cream Patient Preference   heparin injection 5,000 Units Discontinued by provider   acetaminophen (TYLENOL) tablet 500-1,000 mg Duplicate   bisacodyl (DULCOLAX) EC tablet 5 mg    warfarin (COUMADIN) tablet 5 mg Discontinued by provider   furosemide (LASIX) injection 40 mg     Social History:  reports that she has quit smoking. Her smoking use included cigarettes. She has a 30 pack-year smoking history. She has never used smokeless tobacco. She reports that she does not drink alcohol and does not use drugs.  Family History:   Family History  Problem Relation Age of Onset   Heart disease Father    Hypertension Father    Hyperlipidemia Father    Breast cancer Neg Hx     Pertinent items are noted in HPI. Weight change:   Intake/Output Summary (Last 24 hours) at 08/21/2023 0913 Last data filed at 08/21/2023 0747 Gross per 24 hour  Intake 340 ml  Output 500 ml  Net -160 ml   BP (!) 177/51   Pulse 88   Temp 99.1 F (37.3 C) (Axillary)   Resp 20   Ht 4\' 8"  (1.422 m)   Wt 54.9 kg   SpO2 98%   BMI 27.13 kg/m  Vitals:   08/21/23 0738 08/21/23 0800 08/21/23 0811 08/21/23 0830  BP:  (!) 162/54 (!) 177/72 (!) 177/51  Pulse: 90 85 94 88  Resp: (!) 21 (!) 22 (!) 30  20  Temp: 99.1 F (37.3 C)     TempSrc: Axillary     SpO2: 100%  97% 99% 98%  Weight:      Height:         General appearance: cachectic, fatigued, and frail and chronically ill-appearing Head: Normocephalic, without obvious abnormality, atraumatic Resp: diminished breath sounds bibasilar Cardio: irregularly irregular rhythm, no rub, and mechanical click GI: mildly distended, +BS, soft, NT Extremities: edema 1+ pretibial edema bilaterally  Labs: Basic Metabolic Panel: Recent Labs  Lab 08/16/23 1107 08/20/23 1212 08/21/23 0512  NA 135 129* 134*  K 4.9 5.4* 5.4*  CL 105 101 102  CO2 19* 18* 20*  GLUCOSE 187* 176* 101*  BUN 68* 88* 90*  CREATININE 3.22* 4.92* 5.27*  ALBUMIN  --  2.9* 4.0  CALCIUM 8.2* 8.3* 9.1  PHOS  --  4.7*  --    Liver Function Tests: Recent Labs  Lab 08/20/23 1212 08/21/23 0512  AST 22 20  ALT 13 10  ALKPHOS 108 85  BILITOT 0.5 0.8  PROT 5.5* 6.0*  ALBUMIN 2.9* 4.0   Recent Labs  Lab 08/20/23 1212  LIPASE 39   No results for input(s): "AMMONIA" in the last 168 hours. CBC: Recent Labs  Lab 08/20/23 1212 08/21/23 0512  WBC 6.0 6.1  NEUTROABS 4.2  --   HGB 8.4* 7.3*  HCT 26.1* 24.0*  MCV 100.8* 101.3*  PLT 136* 126*   PT/INR: @LABRCNTIP (inr:5) Cardiac Enzymes: )No results for input(s): "CKTOTAL", "CKMB", "CKMBINDEX", "TROPONINI" in the last 168 hours. CBG: Recent Labs  Lab 08/20/23 1730 08/20/23 2109 08/21/23 0739  GLUCAP 143* 170* 107*    Iron Studies: No results for input(s): "IRON", "TIBC", "TRANSFERRIN", "FERRITIN" in the last 168 hours.  Xrays/Other Studies: DG Chest Portable 1 View  Result Date: 08/20/2023 CLINICAL DATA:  Shortness of breath EXAM: PORTABLE CHEST 1 VIEW COMPARISON:  03/26/2023 FINDINGS: Post sternotomy changes. Cardiomegaly with small pleural effusions and diffuse increased interstitial and mild ground-glass opacities suspicious for edema. Calcified lung nodules bilaterally consistent with granuloma. Mild focal airspace disease at the left greater than right lung  base. IMPRESSION: Cardiomegaly with small pleural effusions and diffuse increased interstitial and mild ground-glass opacities suspicious for edema. Mild focal airspace disease at the left greater than right lung base, atelectasis versus pneumonia. Electronically Signed   By: Jasmine Pang M.D.   On: 08/20/2023 17:35     Assessment/Plan:  AKI/CKD stage IV - presumably due to cardiorenal syndrome in setting of acute on chronic CHF.  Scr continues to climb after IV lasix 40 mg yesterday.  Will increase dose to 80 mg IV bid and add albumin prior to IV lasix dose.  We discussed the severity and chronicity of her CKD as well as her multiple co-morbidities, specifically about her biventricular failure and pulmonary HTN.  She is not a suitable candidate for dialysis given her advanced age, multiple co-morbidities, and poor functional and nutritional status.  She admits that "my body couldn't tolerate dialysis".  Will try conservative measures for now, but will also consult palliative care to help with goals/limits of care.   Avoid nephrotoxic medications including NSAIDs and iodinated intravenous contrast exposure unless the latter is absolutely indicated.   Preferred narcotic agents for pain control are hydromorphone, fentanyl, and methadone. Morphine should not be used.  Avoid Baclofen and avoid oral sodium phosphate and magnesium citrate based laxatives / bowel preps.  Continue strict Input and Output monitoring. Will monitor the patient closely with you  and intervene or adjust therapy as indicated by changes in clinical status/labs   Acute on chronic CHF with RV dysfunction - Cardiology consulted and agree with repeat ECHO.  Diuresis as above. Hyperkalemia - due to #1.  Continue with Lokelma and IV lasix and follow.  Acute on chronic anemia of CKD stage IV - will check iron stores and transfuse for Hgb <7.  She may need ESA, however in short term follow for transfusion needs. Pulmonary HTN/RV dysfunction -  per Cardiology S/p mechanical AVR - on coumadin. PAF - rate controlled.   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     Julien Nordmann Brenen Beigel 08/21/2023, 9:13 AM

## 2023-08-21 NOTE — TOC Progression Note (Signed)
Transition of Care [facility]) - Progression Note    Patient Details  Name: [name] MRN: [id number] Date of Birth: October 02, 1941  Transition of Care [facility]) CM/SW Contact  Elliot Gault, LCSW Phone Number: 08/21/2023, 2:42 PM  Clinical Narrative:     Received update from Palliative APNP that pt is interested in switching from Palliative to Hospice care at home. She is connected to [place].   Updated Rae Halsted at [facility] who states they will start working on this and arrange for any DME if pt needs it.  Anticipating dc in a couple days. Will follow.  Expected Discharge Plan: Home w Home Health Services Barriers to Discharge: Continued Medical Work up  Expected Discharge Plan and Services In-house Referral: Clinical Social Work   Post Acute Care Choice: Durable Medical Equipment Living arrangements for the past 2 months: Single Family Home                                       Social Determinants of Health (SDOH) Interventions SDOH Screenings   Food Insecurity: No Food Insecurity (08/20/2023)  Housing: Patient Declined (08/20/2023)  Transportation Needs: No Transportation Needs (08/20/2023)  Utilities: Not At Risk (08/20/2023)  Alcohol Screen: Low Risk  (02/13/2022)  Depression (PHQ2-9): Low Risk  (07/20/2023)  Financial Resource Strain: Low Risk  (02/28/2022)  Physical Activity: Inactive (02/28/2022)  Social Connections: Moderately Integrated (06/22/2022)  Stress: No Stress Concern Present (02/28/2022)  Tobacco Use: Medium Risk (08/21/2023)    Readmission Risk Interventions    08/21/2023   10:26 AM 01/14/2023   12:20 PM 12/25/2022    9:36 AM  Readmission Risk Prevention Plan  Transportation Screening Complete Complete Complete  Medication Review Oceanographer) Complete Complete Complete  HRI or Home Care Consult Complete Complete Complete  SW Recovery Care/Counseling Consult Complete Complete Complete  Palliative Care Screening Not Applicable Not Applicable Not  Applicable  Skilled Nursing Facility Not Applicable Not Applicable Not Applicable

## 2023-08-21 NOTE — Progress Notes (Addendum)
PHARMACY - ANTICOAGULATION CONSULT NOTE  Pharmacy Consult for warfarin Indication:  mechanical valve  Allergies  Allergen Reactions   Amlodipine Swelling   Amoxicillin-Pot Clavulanate Diarrhea, Nausea And Vomiting and Other (See Comments)   Carvedilol Other (See Comments)    Terrible cramping in the feet and had a lot of bowel movements, but not diarrhea   Chlorthalidone Other (See Comments)   Ciprofloxacin Diarrhea, Nausea Only and Other (See Comments)    Stomach ache   Codeine Other (See Comments)    Insomnia, anxiety   Escitalopram Other (See Comments)    Other Reaction(s): made head feel funny   Ezetimibe Other (See Comments)    Reaction not recalled   Fluconazole Diarrhea and Other (See Comments)   Immune Globulin (Human) Other (See Comments)    Severe/excruciating pain   Levofloxacin In D5w     Vomiting, diarrhea, insomnia   Losartan Other (See Comments) and Swelling    Patient doesn't recall site of swelling   Lovastatin     Other Reaction(s): do not remember   Niacin     Other Reaction(s): made her feel hot all over   Sertraline     Other Reaction(s): made head feel funny   Sulfamethoxazole-Trimethoprim Diarrhea, Nausea Only and Other (See Comments)    Stomach upset   Verapamil Hives and Other (See Comments)   Zocor [Simvastatin - High Dose] Other (See Comments)    Reaction not recalled   Gatifloxacin Other (See Comments) and Rash    Redness to skin around eye   Lovenox [Enoxaparin] Swelling and Rash    Swelling of legs and rash all over   Simvastatin Other (See Comments) and Rash    Patient Measurements: Height: 4\' 8"  (142.2 cm) Weight: 54.9 kg (121 lb 0.5 oz) IBW/kg (Calculated) : 36.3  Vital Signs: Temp: 99.1 F (37.3 C) (11/12 0738) Temp Source: Axillary (11/12 0738) BP: 162/54 (11/12 0800) Pulse Rate: 85 (11/12 0800)  Labs: Recent Labs    08/20/23 1212 08/20/23 1412 08/21/23 0512  HGB 8.4*  --  7.3*  HCT 26.1*  --  24.0*  PLT 136*  --   126*  APTT  --   --  50*  LABPROT 36.9*  --  36.8*  INR 3.7*  --  3.7*  CREATININE 4.92*  --  5.27*  TROPONINIHS 24* 25*  --     Estimated Creatinine Clearance: 5.5 mL/min (A) (by C-G formula based on SCr of 5.27 mg/dL (H)).   Medical History: Past Medical History:  Diagnosis Date   Aortic atherosclerosis (HCC) 01/23/2018   Atrial fibrillation (HCC)    Benign positional vertigo 10/06/2011   Carotid artery disease (HCC)    Chemotherapy induced neutropenia (HCC) 07/21/2019   Cholelithiasis 01/23/2018   Cholelithiasis 01/23/2018   Chronic anticoagulation    Chronic kidney failure, stage 4 (severe) (HCC)    Colovesical fistula, ruled out 04/14/2022   Coronary artery disease    status post coronary artery bypass grafting times 07/10/2004   Diabetes mellitus    Hypercholesteremia    Hypertension    Mechanical heart valve present    H. aortic valve replacement at the time of bypass surgery October 2005   Moderate to severe pulmonary hypertension (HCC)    Peripheral arterial disease (HCC)    history of left common iliac artery PTA and stenting for a chronic total occlusion 08/26/01   S/P cholecystectomy 03/06/2018    Medications:  Medications Prior to Admission  Medication Sig Dispense Refill Last Dose  acetaminophen (TYLENOL) 500 MG tablet Take 500-1,000 mg by mouth every 6 (six) hours as needed for mild pain (pain score 1-3) or fever.   08/19/2023   allopurinol (ZYLOPRIM) 100 MG tablet Take 100 mg by mouth daily.   08/20/2023   ALPRAZolam (XANAX) 0.5 MG tablet Take 0.5 mg by mouth at bedtime.   08/19/2023   amLODipine (NORVASC) 10 MG tablet Take 1 tablet (10 mg total) by mouth daily. (Patient taking differently: Take 10 mg by mouth at bedtime.) 90 tablet 3 08/19/2023   aspirin EC 81 MG tablet Take 81 mg by mouth daily. Swallow whole.   08/19/2023   atorvastatin (LIPITOR) 80 MG tablet Take 1 tablet (80 mg total) by mouth daily at 6 PM.   08/19/2023   budesonide (PULMICORT) 0.5  MG/2ML nebulizer solution Take 2 mLs (0.5 mg total) by nebulization 2 (two) times daily. 120 mL 1 Past Week   Cholecalciferol (VITAMIN D3) 50 MCG (2000 UT) capsule Take 2,000 Units by mouth daily with lunch.   08/19/2023   cloNIDine (CATAPRES) 0.3 MG tablet Take 0.3 mg by mouth 2 (two) times daily.   08/20/2023   Cyanocobalamin (VITAMIN B12) 1000 MCG TBCR Take 1,000 mcg by mouth daily with lunch.   08/20/2023   famotidine (PEPCID) 20 MG tablet Take 20 mg by mouth 2 (two) times daily.   08/20/2023   ferrous sulfate 325 (65 FE) MG tablet Take 1 tablet (325 mg total) by mouth daily with breakfast. (Patient taking differently: Take 650 mg by mouth daily. Lunch time.)  3 08/19/2023   hydrALAZINE (APRESOLINE) 100 MG tablet Take 1 tablet (100 mg total) by mouth 3 (three) times daily. 270 tablet 3 08/20/2023   insulin aspart (NOVOLOG) 100 UNIT/ML injection Inject 2-8 Units into the skin 3 (three) times daily with meals. Per sliding scale, If blood sugar is 200 to 250, give 2 units. If blood sugar is 251 to 300, give 4 units. If blood sugar is 301 to 350, give 6 units. If blood sugar is greater than 350, give 8 units and call MD.   Past Week   ipratropium-albuterol (DUONEB) 0.5-2.5 (3) MG/3ML SOLN Take 3 mLs by nebulization every 6 (six) hours as needed. 360 mL 0 Past Week   ondansetron (ZOFRAN-ODT) 4 MG disintegrating tablet Take 4 mg by mouth every 8 (eight) hours as needed for nausea or vomiting.   08/19/2023   pantoprazole (PROTONIX) 40 MG tablet Take 1 tablet (40 mg total) by mouth 2 (two) times daily. 60 tablet 2 08/20/2023   senna-docusate (SENOKOT-S) 8.6-50 MG tablet Take 2 tablets by mouth at bedtime. (Patient taking differently: Take 2 tablets by mouth at bedtime as needed for mild constipation.) 60 tablet 2 08/19/2023   sertraline (ZOLOFT) 25 MG tablet Take 25 mg by mouth at bedtime.   08/19/2023   simethicone (GAS-X) 80 MG chewable tablet Chew 1 tablet (80 mg total) by mouth 4 (four) times daily  as needed for flatulence. 100 tablet 2 Past Week   sodium bicarbonate 650 MG tablet Take 1 tablet (650 mg total) by mouth 2 (two) times daily. 60 tablet 0 08/20/2023   spironolactone (ALDACTONE) 25 MG tablet TAKE 1 TABLET EVERY DAY (Patient taking differently: Take 25 mg by mouth at bedtime.) 90 tablet 3 08/19/2023   tadalafil, PAH, (ADCIRCA) 20 MG tablet TAKE 2 TABLETS BY MOUTH ONCE DAILY (Patient taking differently: Take 40 mg by mouth at bedtime.) 60 tablet 11 08/19/2023   warfarin (COUMADIN) 5 MG tablet Take  1 tablet (5 mg total) by mouth See admin instructions. Take 1 tablet 5mg  on Tuesday/Thursday/Sunday and 1/2 Tab (2.5 mg) on MWFSat (Patient taking differently: Take 5 mg by mouth See admin instructions. Take 1 tablet every day unitl visti at Coumad[facility] Clinic Visit on Wednesday)   08/19/2023 at 1700   enoxaparin (LOVENOX) 60 MG/0.6ML injection Inject 0.5 mLs (50 mg total) into the skin daily for 7 days. (Patient not taking: Reported on 08/20/2023) 3.5 mL 0 Not Taking   Insulin Syringe-Needle U-100 (B-D INS SYR ULTRAFINE .3CC/29G) 29G X 1/2" 0.3 ML MISC Use for sliding scale for breakthrough high blood sugar readings 100 each 1    nitrofurantoin (MACRODANTIN) 100 MG capsule Take 100 mg by mouth 2 (two) times daily. (Patient not taking: Reported on 08/20/2023)   Not Taking    Assessment: 82 y.o. female with medical history significant for multiple medical condition including emphysematous cystitis, CKD 4, diastolic CHF, AIHA, mechanical aortic valve, hypertension and pulmonary hypertension, atrial fibrillation, GI bleed, peripheral artery disease, diabetes mellitus. She patient presented to the ED with complaints of weakness, not eating well. .  Patient chronically anticoagulated with warfarin and had slightly elevated INR 3.7.  Pharmacy to dose   INR 3.7, slightly elevated hgb 7.3  PTA dose. Patient said taking 5mg  daily  Goal of Therapy:  INR 2.5-3.5  Monitor platelets by anticoagulation  protocol: Yes   Plan:  No warfarin today Daily PT-INR Monitor for S/s of bleeding  Judeth Cornfield, PharmD Clinical Pharmacist 08/21/2023 8:15 AM

## 2023-08-21 NOTE — Progress Notes (Signed)
Pt calm & cooperative with care. Diuresing well, large voids missed by external catheter & briefs intermittently in the bed. Pt Tolerated 30 mins in chair with PT. Pt endorses being very tired & wanting to rest. No complaints of pain or discomfort. BP well controlled with home medications.  Not a candidate for dialysis per nephrology.   Of note: Around 1400 pt sleeping & brady down to low of 33. Patient was woken up by staff. Asymptomatic & HR returned to 50-60s - still in A fib. Dr. Flossie Dibble made aware.

## 2023-08-21 NOTE — Progress Notes (Signed)
PROGRESS NOTE    Patient: [name]                            PCP: Gaspar Garbe, MD                    DOB: Nov 04, 1940            DOA: 08/20/2023 XLK:[id number]             DOS: 08/21/2023, 10:26 AM   LOS: 1 day   Date of Service: The patient was seen and examined on 08/21/2023  Subjective:   The patient was seen and examined this morning, awake alert oriented, denies any chest pain, shortness of breath. Per nursing she has diuresed well. No issues overnight  Brief Narrative:   [name] is a 82 year old female with extensive history of pulmonary hypertension, A-fib,h/o GI bleed, PAD, HTN, DM2, CKD 4, diastolic CHF, AIHA, emphysematous cystitis, mechanical aortic valve ... Recent hospitalization discharged on 08/09/23 for emphysematous cystitis related Klebsiella.  With ESBL.  Treated with IV gentamicin, also on last admission received 1 unit of PRBC transfusion.  Presented to ED with a chief complaint of not feeling well, poor p.o. intake, generalized weakness. Forted that she was taken off her Lasix by her cardiologist last week  ED course:  Blood pressure (!) 158/54, pulse (!) 50, temperature (!) 97.4 F (36.3 C), temperature source Oral, resp. rate 14, height 4\' 9"  (1.448 m), weight 49.9 kg, SpO2 98%. CBC: WBC: 6.0 Hgb 8.4 INR: 3.7 BNP 687 Sodium 129 potassium: 5.4, glucose 176, BUN 88, creatinine 4.92 (Cr. around 2-3), albumin 2.9, GFR 8 (baseline around 27) UA-unremarkable  Given 40 mg IV Lasix, Lokelma in ED Course of patient to be admitted for severe electrolyte acute on chronic kidney disease, volume overload.     Assessment & Plan:   Principal Problem:   Acute renal failure superimposed on stage 4 chronic kidney disease (HCC) Active Problems:   Chronic diastolic CHF (congestive heart failure) (HCC)   H/O mechanical aortic valve replacement   Acute on chronic anemia   Chronic anticoagulation   Anxiety   Diabetes mellitus type 2, controlled  (HCC)   Bilateral lower extremity edema   DNR (do not resuscitate)/DNI(Do Not Intubate)   Hyponatremia   Essential hypertension   Hypercholesteremia   Thrombocytopenia (HCC)   Chronic atrial fibrillation (HCC)   Hyperkalemia   PAH (pulmonary artery hypertension) (HCC)   Anemia of chronic disease   Hypoalbuminemia due to protein-calorie malnutrition (HCC)   CAD, multiple vessel     Assessment and Plan: * Acute renal failure superimposed on stage 4 chronic kidney disease (HCC) -Acute on chronic kidney disease stage IV -Elevated creatinine from baseline Lab Results  Component Value Date   CREATININE 5.27 (H) 08/21/2023   CREATININE 4.92 (H) 08/20/2023   CREATININE 3.22 (H) 08/16/2023   -Fortunately week have to continue with Lasix due to volume overload, acute on chronic CHF laceration -Avoid all nephrotoxins-including NSAIDs, morphine, baclofen, sodium phosphate, magnesium.[name]. - -Discussed the case with nephrologist Dr. Lorraine Lax with diuresing her and giving albumin---   -Worsening creatinine this a.m., per nephrology increasing Lasix from 40 to 80 mg IV twice daily with albumin  -Patient is not a candidate for hemodialysis due to multiple COVID disease, advanced age  Cardiorenal syndrome -Management as above, appreciate cardiology nephrology follow-up close  Chronic diastolic CHF (congestive heart failure) (HCC) -  Acute on chronic diastolic CHF with exacerbation Of severe shortness of breath, generalized weakness, bilateral lower extremity edema -IV Lasix 40 mg twice daily >> increased to 80 mg twice daily -Monitor I's and O's, daily weight  Intake/Output Summary (Last 24 hours) at 08/21/2023 1034 Last data filed at 08/21/2023 0747 Gross per 24 hour  Intake 340 ml  Output 500 ml  Net -160 ml     Filed Weights   08/20/23 1051  Weight: 49.9 kg  Last echo 02/23/2023 ED JF 60-70%, LVH, moderately increased right ventricular wall thickness, moderately  elevated pulmonary artery systolic pressure, left atrial dilatation, right atrial moderately dilated, mild-moderate mitral regurgitation tricuspid regurgitation, repaired aortic valve with [employer] mechanical valve  -Home medications of Lasix, tadalafil, spironolactone, will be continued  -Cardiology Dr. Wyline Mood following -Continue diuretics, repeating echo    Acute on chronic anemia - Of chronic disease-multifactorial, including iron deficiency - Patient follows with hematology as an outpatient, -Family received Granix and blood transfusion -Last blood transfusion 1 unit of PRBC on last admission October 2024 -Monitoring CBC closely, hemoglobin at baseline     Latest Ref Rng & Units 08/21/2023    5:12 AM 08/20/2023   12:12 PM 08/13/2023   12:41 PM  CBC  WBC 4.0 - 10.5 K/uL 6.1  6.0  6.5   Hemoglobin 12.0 - 15.0 g/dL 7.3  8.4  7.6   Hematocrit 36.0 - 46.0 % 24.0  26.1  24.0   Platelets 150 - 400 K/uL 126  136  124     -Continue iron supplements  H/O mechanical aortic valve replacement - On Coumadin -pharmacy dosing -Therapeutic INR 3.7 >>   DNR (do not resuscitate)/DNI(Do Not Intubate) - Confirmed DNR/DNI status   Bilateral lower extremity edema - Due to CHF exacerbation -Initiating IV diuretic Lasix  CKD (chronic kidney disease), stage IV (HCC) Acute on chronic CKD stage IV -Continue to monitor, elevated creatinine from baseline Unfortunately will need Lasix Avoiding other nephrotoxins  Diabetes mellitus type 2, controlled (HCC) -Holding home medication -Checking CBG q. ACHS, SSI coverage -Last A1c 6.22 weeks ago  Chronic anticoagulation - On Coumadin due to mechanical aortic valve and A-fib -INR goal 2.5-3.5 INR currently therapeutic at 3.7 -Consulting pharmacy to monitor and adjust dose of Coumadin  Hyponatremia Serum sodium at 129 likely due to volume overload, Baseline sodium 131-135 -Continue with IV diuretics -Monitor serum sodium closely  CAD,  multiple vessel Continue anticoagulation, continue statins,  Hypoalbuminemia due to protein-calorie malnutrition (HCC) - Will monitor and replete accordingly -Courage p.o. intake  PAH (pulmonary artery hypertension) (HCC) - Continue Lasix, tadalafil, spironolactone,  Hyperkalemia - Potassium 5.4 in the setting of a on CKD -Dose of Lokelma given in ED, will monitor closely -Patient will start diuretics IV Lasix,  Chronic atrial fibrillation (HCC) - Continue rate control meds  - continue Coumadin   Thrombocytopenia (HCC) - On Coumadin, monitoring platelets closely  Hypercholesteremia - Continue statins  Essential hypertension - Stable, reviewing home medication resuming accordingly -Will monitor BP closely, on IV Lasix    ----------------------------------------------------------------------------------------------------------------------------------------------- Nutritional status:  [name] BMI is: Body mass index is 27.13 kg/m. I agree with the assessment and plan as outlined Skin Assessment: I have examined the patient's skin and I agree with the wound assessment as performed by wound care team As outlined belowe: Pressure Injury 01/13/23 Sacrum Medial;Bilateral Stage 2 -  Partial thickness loss of dermis presenting as a shallow open injury with a red, pink wound bed without slough. Wound  is about the size of a quarter with linieal purple line through middle (Active)  01/13/23 1844  Location: Sacrum  Location Orientation: Medial;Bilateral  Staging: Stage 2 -  Partial thickness loss of dermis presenting as a shallow open injury with a red, pink wound bed without slough.  Wound Description (Comments): Wound is about the size of a quarter with linieal purple line through middle  Present on Admission: Yes     ------------------------------------------------------------------------------------------------------------------------------------------------  DVT  prophylaxis:  TED hose Start: 08/20/23 1439 SCDs Start: 08/20/23 1439   Code Status:   Code Status: Full Code  Family Communication: No family member present at bedside- attempt will be made to update daily  -Advance care planning has been discussed.   Admission status:   Status is: Inpatient Remains inpatient appropriate because: Requiring IV diuretics, evaluation by consultant: Nephrology and cardiology   Disposition: From  - home             Planning for discharge in > 3 days Home w [facility]   Procedures:   No admission procedures for hospital encounter.   Antimicrobials:  Anti-infectives (From admission, onward)    None        Medication:   allopurinol  100 mg Oral Daily   ALPRAZolam  0.5 mg Oral QHS   amLODipine  10 mg Oral QHS   atorvastatin  80 mg Oral q1800   budesonide  0.5 mg Nebulization BID   Chlorhexidine Gluconate Cloth  6 each Topical Q0600   cloNIDine  0.3 mg Oral BID   famotidine  20 mg Oral BID   ferrous sulfate  650 mg Oral Q breakfast   furosemide  80 mg Intravenous Q12H   hydrALAZINE  100 mg Oral TID   insulin aspart  0-9 Units Subcutaneous TID WC   insulin glargine-yfgn  5 Units Subcutaneous QHS   ondansetron (ZOFRAN) IV  4 mg Intravenous Once   pantoprazole  40 mg Oral BID WC   sertraline  25 mg Oral QHS   sodium bicarbonate  650 mg Oral BID   sodium chloride flush  3 mL Intravenous Q12H   sodium chloride flush  3 mL Intravenous Q12H   sodium zirconium cyclosilicate  10 g Oral Daily   tadalafil (PAH)  40 mg Oral QHS    sodium chloride, acetaminophen **OR** acetaminophen, hydrALAZINE, HYDROmorphone (DILAUDID) injection, ipratropium, levalbuterol, ondansetron **OR** ondansetron (ZOFRAN) IV, oxyCODONE, senna-docusate, sodium chloride flush, sodium phosphate, traZODone   Objective:   Vitals:   08/21/23 0811 08/21/23 0830 08/21/23 0900 08/21/23 1000  BP: (!) 177/72 (!) 177/51 (!) 157/39 (!) 158/38  Pulse: 94 88 82 88  Resp: (!) 30 20 18 18    Temp:      TempSrc:      SpO2: 99% 98% 98% 99%  Weight:      Height:        Intake/Output Summary (Last 24 hours) at 08/21/2023 1026 Last data filed at 08/21/2023 0747 Gross per 24 hour  Intake 340 ml  Output 500 ml  Net -160 ml   Filed Weights   08/20/23 1051 08/20/23 1524 08/21/23 0500  Weight: 49.9 kg 54.2 kg 54.9 kg     Physical examination:   Constitution:  Alert, cooperative, no distress,  Appears calm and comfortable  Psychiatric:   Normal and stable mood and affect, cognition intact,   HEENT:        Normocephalic, PERRL, otherwise with in Normal limits  Chest:  Chest symmetric Cardio vascular:  S1/S2, RRR, No murmure, No Rubs or Gallops  pulmonary: Clear to auscultation bilaterally, respirations unlabored, negative wheezes / crackles Abdomen: Soft, non-tender, non-distended, bowel sounds,no masses, no organomegaly Muscular skeletal: Limited exam - in bed, able to move all 4 extremities,   Neuro: CNII-XII intact. , normal motor and sensation, reflexes intact  Extremities: No pitting edema lower extremities, +2 pulses  Skin: Dry, warm to touch, negative for any Rashes, No open wounds Wounds: per nursing documentation   ------------------------------------------------------------------------------------------------------------------------------------------    LABs:     Latest Ref Rng & Units 08/21/2023    5:12 AM 08/20/2023   12:12 PM 08/13/2023   12:41 PM  CBC  WBC 4.0 - 10.5 K/uL 6.1  6.0  6.5   Hemoglobin 12.0 - 15.0 g/dL 7.3  8.4  7.6   Hematocrit 36.0 - 46.0 % 24.0  26.1  24.0   Platelets 150 - 400 K/uL 126  136  124       Latest Ref Rng & Units 08/21/2023    5:12 AM 08/20/2023   12:12 PM 08/16/2023   11:07 AM  CMP  Glucose 70 - 99 mg/dL 161  096  045   BUN 8 - 23 mg/dL 90  88  68   Creatinine 0.44 - 1.00 mg/dL 4.09  8.11  9.14   Sodium 135 - 145 mmol/L 134  129  135   Potassium 3.5 - 5.1 mmol/L 5.4  5.4  4.9   Chloride 98 - 111 mmol/L  102  101  105   CO2 22 - 32 mmol/L 20  18  19    Calcium 8.9 - 10.3 mg/dL 9.1  8.3  8.2   Total Protein 6.5 - 8.1 g/dL 6.0  5.5    Total Bilirubin <1.2 mg/dL 0.8  0.5    Alkaline Phos 38 - 126 U/L 85  108    AST 15 - 41 U/L 20  22    ALT 0 - 44 U/L 10  13         Micro Results Recent Results (from the past 240 hour(s))  Resp panel by RT-PCR (RSV, Flu A&B, Covid) Anterior Nasal Swab     Status: None   Collection Time: 08/20/23 12:00 PM   Specimen: Anterior Nasal Swab  Result Value Ref Range Status   SARS Coronavirus 2 by RT PCR NEGATIVE NEGATIVE Final    Comment: (NOTE) SARS-CoV-2 target nucleic acids are NOT DETECTED.  The SARS-CoV-2 RNA is generally detectable in upper respiratory specimens during the acute phase of infection. The lowest concentration of SARS-CoV-2 viral copies this assay can detect is 138 copies/mL. A negative result does not preclude SARS-Cov-2 infection and should not be used as the sole basis for treatment or other patient management decisions. A negative result may occur with  improper specimen collection/handling, submission of specimen other than nasopharyngeal swab, presence of viral mutation(s) within the areas targeted by this assay, and inadequate number of viral copies(<138 copies/mL). A negative result must be combined with clinical observations, patient history, and epidemiological information. The expected result is Negative.  Fact Sheet for Patients:  BloggerCourse.com  Fact Sheet for Healthcare Providers:  SeriousBroker.it  This test is no t yet approved or cleared by the [place] FDA and  has been authorized for detection and/or diagnosis of SARS-CoV-2 by FDA under an Emergency Use Authorization (EUA). This EUA will remain  in effect (meaning this test can be used) for the duration of the COVID-19  declaration under Section 564(b)(1) of the Act, 21 U.S.C.section 360bbb-3(b)(1),  unless the authorization is terminated  or revoked sooner.       Influenza A by PCR NEGATIVE NEGATIVE Final   Influenza B by PCR NEGATIVE NEGATIVE Final    Comment: (NOTE) The Xpert Xpress SARS-CoV-2/FLU/RSV plus assay is intended as an aid in the diagnosis of influenza from Nasopharyngeal swab specimens and should not be used as a sole basis for treatment. Nasal washings and aspirates are unacceptable for Xpert Xpress SARS-CoV-2/FLU/RSV testing.  Fact Sheet for Patients: BloggerCourse.com  Fact Sheet for Healthcare Providers: SeriousBroker.it  This test is not yet approved or cleared by the [place] FDA and has been authorized for detection and/or diagnosis of SARS-CoV-2 by FDA under an Emergency Use Authorization (EUA). This EUA will remain in effect (meaning this test can be used) for the duration of the COVID-19 declaration under Section 564(b)(1) of the Act, 21 U.S.C. section 360bbb-3(b)(1), unless the authorization is terminated or revoked.     Resp Syncytial Virus by PCR NEGATIVE NEGATIVE Final    Comment: (NOTE) Fact Sheet for Patients: BloggerCourse.com  Fact Sheet for Healthcare Providers: SeriousBroker.it  This test is not yet approved or cleared by the [place] FDA and has been authorized for detection and/or diagnosis of SARS-CoV-2 by FDA under an Emergency Use Authorization (EUA). This EUA will remain in effect (meaning this test can be used) for the duration of the COVID-19 declaration under Section 564(b)(1) of the Act, 21 U.S.C. section 360bbb-3(b)(1), unless the authorization is terminated or revoked.  Performed at [facility], [address], [place], [place] [address]   MRSA Next Gen by PCR, Nasal     Status: None   Collection Time: 08/20/23  3:00 PM   Specimen: Nasal Mucosa; Nasal Swab  Result Value Ref Range Status   MRSA by PCR Next Gen  NOT DETECTED NOT DETECTED Final    Comment: (NOTE) The GeneXpert MRSA Assay (FDA approved for NASAL specimens only), is one component of a comprehensive MRSA colonization surveillance program. It is not intended to diagnose MRSA infection nor to guide or monitor treatment for MRSA infections. Test performance is not FDA approved in patients less than 104 years old. Performed at [facility], [address] [place], [place] [address]     Radiology Reports DG Chest Portable 1 View  Result Date: 08/20/2023 CLINICAL DATA:  Shortness of breath EXAM: PORTABLE CHEST 1 VIEW COMPARISON:  03/26/2023 FINDINGS: Post sternotomy changes. Cardiomegaly with small pleural effusions and diffuse increased interstitial and mild ground-glass opacities suspicious for edema. Calcified lung nodules bilaterally consistent with granuloma. Mild focal airspace disease at the left greater than right lung base. IMPRESSION: Cardiomegaly with small pleural effusions and diffuse increased interstitial and mild ground-glass opacities suspicious for edema. Mild focal airspace disease at the left greater than right lung base, atelectasis versus pneumonia. Electronically Signed   By: Jasmine Pang M.D.   On: 08/20/2023 17:35    SIGNED: Kendell Bane, MD, FHM. FAAFP. [facility] - [facility] Critical care time spent - 55 min.  In seeing, evaluating and examining the patient. Reviewing medical records, labs, drawn plan of care. [facility],  Pager (please use [email or web address] to page/ text) Please use Epic Secure Chat for non-urgent communication (7AM-7PM)  If 7PM-7AM, please contact night-coverage [email or web address], 08/21/2023, 10:26 AM

## 2023-08-21 NOTE — Consult Note (Signed)
Cardiology Consultation   Patient ID: [name] MCCLESKEY MRN: [id number]; DOB: 03-24-1941  Admit date: 08/20/2023 Date of Consult: 08/21/2023  PCP:  [name] Garbe, MD   [facility] Providers Cardiologist:  [name] Batty, MD  Advanced Heart Failure:  [name] Ancona, MD  {    Patient Profile:   [name] is a 82 y.o. female with a hx of PAD, CAD with prior CABG, mechanical AVR, pulmonary HTN with RV dysfunction, chronic HFpEF, long standing persistent afib, prior GI bleed secondary to gastric ulcers, CKD, anemia who is being seen 08/21/2023 for the evaluation of SOB at the request of Dr [name].  History of Present Illness:   Ms. Saturday 82 yo female history of PAD, CAD with prior CABG, mechanical AVR, pulmonary HTN with RV dysfunction, chronic HFpEF, long standing persistent afib, prior GI bleed secondary to gastric ulcers, CKD, anemia, admitted with weakness. Reports increased LE edema and SOB as well Recent discharge with cysitis, at d/c not on diuretics due to AKI.     INR 3.7 BNP 687 WBC 6 Hgb 8.4 Plt 136 Na 120 K 5.4 Cr 4.92 BUN 88 Mg 2.7  Trop 24-->25 EKG CXR: small pleural effusion, likely pulm edema.   02/2023 echo: LVE 65-70%, indet diastolic, RV low normal function, mod pulm HTN PASP 53, mild to mod MR, [place] mechanical AVR mean grad 22 DI 0.29 AT <100   06/2018 RHC: mean PA 36 PCWP 17 CI 4.16  PVR 3.1 Past Medical History:  Diagnosis Date   Aortic atherosclerosis (HCC) 01/23/2018   Atrial fibrillation (HCC)    Benign positional vertigo 10/06/2011   Carotid artery disease (HCC)    Chemotherapy induced neutropenia (HCC) 07/21/2019   Cholelithiasis 01/23/2018   Cholelithiasis 01/23/2018   Chronic anticoagulation    Chronic kidney failure, stage 4 (severe) (HCC)    Colovesical fistula, ruled out 04/14/2022   Coronary artery disease    status post coronary artery bypass grafting times 07/10/2004   Diabetes mellitus    Hypercholesteremia     Hypertension    Mechanical heart valve present    H. aortic valve replacement at the time of bypass surgery October 2005   Moderate to severe pulmonary hypertension (HCC)    Peripheral arterial disease (HCC)    history of left common iliac artery PTA and stenting for a chronic total occlusion 08/26/01   S/P cholecystectomy 03/06/2018    Past Surgical History:  Procedure Laterality Date   anterior repair  2009   AORTIC VALVE REPLACEMENT  2005   Dr. Laneta Simmers   CARDIAC CATHETERIZATION  11/10/2004   40% right common illiac, 70% in stent restenosis of distal left common illiac,    CARDIAC CATHETERIZATION  05/18/2004   LAD 50-70% midstenosis, RCA dominant w/50% stenosis, 50% Right common Illiac artery ostial stenosis, 90% in stent restenosis within midportion of left common illiac stent   Carotid Duplex  03/12/2012   RSA-elev. velocities suggestive of a 50-69% diameter reduction, Right&Left Bulb/Prox ICA-mild-mod.fibrous plaqueelevating Velocities abnormal study.   CHOLECYSTECTOMY N/A 03/01/2018   Procedure: LAPAROSCOPIC CHOLECYSTECTOMY;  Surgeon: Jimmye Norman, MD;  Location: [facility] OR;  Service: General;  Laterality: N/A;   COLONOSCOPY WITH PROPOFOL N/A 01/22/2018   Procedure: COLONOSCOPY WITH PROPOFOL;  Surgeon: Charlott Rakes, MD;  Location: [facility] ENDOSCOPY;  Service: Endoscopy;  Laterality: N/A;   ESOPHAGOGASTRODUODENOSCOPY N/A 07/13/2022   Procedure: ESOPHAGOGASTRODUODENOSCOPY (EGD);  Surgeon: Kerin Salen, MD;  Location: [facility] ENDOSCOPY;  Service: Gastroenterology;  Laterality: N/A;   ESOPHAGOGASTRODUODENOSCOPY (  EGD) WITH PROPOFOL N/A 01/22/2018   Procedure: ESOPHAGOGASTRODUODENOSCOPY (EGD) WITH PROPOFOL;  Surgeon: Charlott Rakes, MD;  Location: [facility] ENDOSCOPY;  Service: Endoscopy;  Laterality: N/A;   ESOPHAGOGASTRODUODENOSCOPY (EGD) WITH PROPOFOL N/A 11/21/2018   Procedure: ESOPHAGOGASTRODUODENOSCOPY (EGD) WITH PROPOFOL;  Surgeon: Kerin Salen, MD;  Location: [facility] ENDOSCOPY;  Service: Gastroenterology;   Laterality: N/A;   ESOPHAGOGASTRODUODENOSCOPY (EGD) WITH PROPOFOL Left 02/27/2019   Procedure: ESOPHAGOGASTRODUODENOSCOPY (EGD) WITH PROPOFOL;  Surgeon: Willis Modena, MD;  Location: [facility] ENDOSCOPY;  Service: Endoscopy;  Laterality: Left;   GIVENS CAPSULE STUDY N/A 11/21/2018   Procedure: GIVENS CAPSULE STUDY;  Surgeon: Kerin Salen, MD;  Location: [facility] ENDOSCOPY;  Service: Gastroenterology;  Laterality: N/A;  To be deployed during EGD   Lower Ext. Duplex  03/12/2012   Right Proximal CIA- vessel narrowing w/elevated velocities 0-49% diameter reduction. Right SFA-mild mixed density plaque throughout vessel.   NM MYOCAR PERF WALL MOTION  05/19/2010   protocol: Persantine, post stress EF 65%, negative for ischemia, low risk scan   RIGHT HEART CATH N/A 06/27/2018   Procedure: RIGHT HEART CATH;  Surgeon: Laurey Morale, MD;  Location: [facility] INVASIVE CV LAB;  Service: Cardiovascular;  Laterality: N/A;   TOTAL ABDOMINAL HYSTERECTOMY W/ BILATERAL SALPINGOOPHORECTOMY  1989   TRANSTHORACIC ECHOCARDIOGRAM  08/29/2012   Moderately calcified annulus of mitral valve, moderate regurg. of both mitral valve and tricuspid valve.       Inpatient Medications: Scheduled Meds:  allopurinol  100 mg Oral Daily   ALPRAZolam  0.5 mg Oral QHS   amLODipine  10 mg Oral QHS   atorvastatin  80 mg Oral q1800   budesonide  0.5 mg Nebulization BID   Chlorhexidine Gluconate Cloth  6 each Topical Q0600   cloNIDine  0.3 mg Oral BID   famotidine  20 mg Oral BID   ferrous sulfate  650 mg Oral Q breakfast   furosemide  40 mg Intravenous Q12H   hydrALAZINE  100 mg Oral TID   insulin aspart  0-9 Units Subcutaneous TID WC   insulin glargine-yfgn  5 Units Subcutaneous QHS   ondansetron (ZOFRAN) IV  4 mg Intravenous Once   pantoprazole  40 mg Oral BID WC   sertraline  25 mg Oral QHS   sodium bicarbonate  650 mg Oral BID   sodium chloride flush  3 mL Intravenous Q12H   sodium chloride flush  3 mL Intravenous Q12H   sodium  zirconium cyclosilicate  10 g Oral Daily   tadalafil (PAH)  40 mg Oral QHS   Continuous Infusions:  sodium chloride     PRN Meds: sodium chloride, acetaminophen **OR** acetaminophen, hydrALAZINE, HYDROmorphone (DILAUDID) injection, ipratropium, levalbuterol, ondansetron **OR** ondansetron (ZOFRAN) IV, oxyCODONE, senna-docusate, sodium chloride flush, sodium phosphate, traZODone  Allergies:    Allergies  Allergen Reactions   Amlodipine Swelling   Amoxicillin-Pot Clavulanate Diarrhea, Nausea And Vomiting and Other (See Comments)   Carvedilol Other (See Comments)    Terrible cramping in the feet and had a lot of bowel movements, but not diarrhea   Chlorthalidone Other (See Comments)   Ciprofloxacin Diarrhea, Nausea Only and Other (See Comments)    Stomach ache   Codeine Other (See Comments)    Insomnia, anxiety   Escitalopram Other (See Comments)    Other Reaction(s): made head feel funny   Ezetimibe Other (See Comments)    Reaction not recalled   Fluconazole Diarrhea and Other (See Comments)   Immune Globulin (Human) Other (See Comments)    Severe/excruciating pain   Levofloxacin  In D5w     Vomiting, diarrhea, insomnia   Losartan Other (See Comments) and Swelling    Patient doesn't recall site of swelling   Lovastatin     Other Reaction(s): do not remember   Niacin     Other Reaction(s): made her feel hot all over   Sertraline     Other Reaction(s): made head feel funny   Sulfamethoxazole-Trimethoprim Diarrhea, Nausea Only and Other (See Comments)    Stomach upset   Verapamil Hives and Other (See Comments)   Zocor [Simvastatin - High Dose] Other (See Comments)    Reaction not recalled   Gatifloxacin Other (See Comments) and Rash    Redness to skin around eye   Lovenox [Enoxaparin] Swelling and Rash    Swelling of legs and rash all over   Simvastatin Other (See Comments) and Rash    Social History:   Social History   Socioeconomic History   Marital status: Widowed     Spouse name: Not on file   Number of children: Not on file   Years of education: Not on file   Highest education level: Not on file  Occupational History   Occupation: Retired  Tobacco Use   Smoking status: Former    Current packs/day: 1.00    Average packs/day: 1 pack/day for 30.0 years (30.0 ttl pk-yrs)    Types: Cigarettes   Smokeless tobacco: Never   Tobacco comments:    quit smoking 2005  Vaping Use   Vaping status: Never Used  Substance and Sexual Activity   Alcohol use: No    Alcohol/week: 0.0 standard drinks of alcohol   Drug use: No   Sexual activity: Not Currently    Birth control/protection: None  Other Topics Concern   Not on file  Social History Narrative   Not on file   Social Determinants of Health   Financial Resource Strain: Low Risk  (02/28/2022)   Overall Financial Resource Strain (CARDIA)    Difficulty of Paying Living Expenses: Not hard at all  Food Insecurity: No Food Insecurity (08/20/2023)   Hunger Vital Sign    Worried About Running Out of Food in the Last Year: Never true    Ran Out of Food in the Last Year: Never true  Transportation Needs: No Transportation Needs (08/20/2023)   PRAPARE - Administrator, Civil Service (Medical): No    Lack of Transportation (Non-Medical): No  Physical Activity: Inactive (02/28/2022)   Exercise Vital Sign    Days of Exercise per Week: 0 days    Minutes of Exercise per Session: 0 min  Stress: No Stress Concern Present (02/28/2022)   [employer] of Occupational Health - Occupational Stress Questionnaire    Feeling of Stress : Only a little  Social Connections: Moderately Integrated (06/22/2022)   Social Connection and Isolation Panel [NHANES]    Frequency of Communication with Friends and Family: More than three times a week    Frequency of Social Gatherings with Friends and Family: More than three times a week    Attends Religious Services: More than 4 times per year    Active Member of  [employer] or Organizations: Yes    Attends Banker Meetings: More than 4 times per year    Marital Status: Widowed  Intimate Partner Violence: Not At Risk (08/20/2023)   Humiliation, Afraid, Rape, and Kick questionnaire    Fear of Current or Ex-Partner: No    Emotionally Abused: No    Physically Abused:  No    Sexually Abused: No    Family History:    Family History  Problem Relation Age of Onset   Heart disease Father    Hypertension Father    Hyperlipidemia Father    Breast cancer Neg Hx      ROS:  Please see the history of present illness.   All other ROS reviewed and negative.     Physical Exam/Data:   Vitals:   08/21/23 0738 08/21/23 0800 08/21/23 0811 08/21/23 0830  BP:  (!) 162/54 (!) 177/72 (!) 177/51  Pulse: 90 85 94 88  Resp: (!) 21 (!) 22 (!) 30 20  Temp: 99.1 F (37.3 C)     TempSrc: Axillary     SpO2: 100% 97% 99% 98%  Weight:      Height:        Intake/Output Summary (Last 24 hours) at 08/21/2023 0842 Last data filed at 08/21/2023 0747 Gross per 24 hour  Intake 340 ml  Output 500 ml  Net -160 ml      08/21/2023    5:00 AM 08/20/2023    3:24 PM 08/20/2023   10:51 AM  Last 3 Weights  Weight (lbs) 121 lb 0.5 oz 119 lb 7.8 oz 110 lb  Weight (kg) 54.9 kg 54.2 kg 49.896 kg     Body mass index is 27.13 kg/m.  General:  Well nourished, well developed, in no acute distress HEENT: normal Neck: + JVD Vascular: No carotid bruits; Distal pulses 2+ bilaterally Cardiac:  irregular, mechanical S2 Lungs:  clear to auscultation bilaterally, no wheezing, rhonchi or rales  Abd: soft, nontender, no hepatomegaly  Ext: no edema Musculoskeletal:  No deformities, BUE and BLE strength normal and equal Skin: warm and dry  Neuro:  CNs 2-12 intact, no focal abnormalities noted Psych:  Normal affect     Laboratory Data:  High Sensitivity Troponin:   Recent Labs  Lab 08/20/23 1212 08/20/23 1412  TROPONINIHS 24* 25*     Chemistry Recent Labs   Lab 08/16/23 1107 08/20/23 1212 08/21/23 0512  NA 135 129* 134*  K 4.9 5.4* 5.4*  CL 105 101 102  CO2 19* 18* 20*  GLUCOSE 187* 176* 101*  BUN 68* 88* 90*  CREATININE 3.22* 4.92* 5.27*  CALCIUM 8.2* 8.3* 9.1  MG  --  2.7*  --   GFRNONAA 14* 8* 8*  ANIONGAP 11 10 12     Recent Labs  Lab 08/20/23 1212 08/21/23 0512  PROT 5.5* 6.0*  ALBUMIN 2.9* 4.0  AST 22 20  ALT 13 10  ALKPHOS 108 85  BILITOT 0.5 0.8   Lipids No results for input(s): "CHOL", "TRIG", "HDL", "LABVLDL", "LDLCALC", "CHOLHDL" in the last 168 hours.  Hematology Recent Labs  Lab 08/20/23 1212 08/21/23 0512  WBC 6.0 6.1  RBC 2.59* 2.37*  HGB 8.4* 7.3*  HCT 26.1* 24.0*  MCV 100.8* 101.3*  MCH 32.4 30.8  MCHC 32.2 30.4  RDW 17.2* 16.8*  PLT 136* 126*   Thyroid No results for input(s): "TSH", "FREET4" in the last 168 hours.  BNP Recent Labs  Lab 08/20/23 1212  BNP 687.0*    DDimer No results for input(s): "DDIMER" in the last 168 hours.   Radiology/Studies:  DG Chest Portable 1 View  Result Date: 08/20/2023 CLINICAL DATA:  Shortness of breath EXAM: PORTABLE CHEST 1 VIEW COMPARISON:  03/26/2023 FINDINGS: Post sternotomy changes. Cardiomegaly with small pleural effusions and diffuse increased interstitial and mild ground-glass opacities suspicious for edema. Calcified lung nodules  bilaterally consistent with granuloma. Mild focal airspace disease at the left greater than right lung base. IMPRESSION: Cardiomegaly with small pleural effusions and diffuse increased interstitial and mild ground-glass opacities suspicious for edema. Mild focal airspace disease at the left greater than right lung base, atelectasis versus pneumonia. Electronically Signed   By: Jasmine Pang M.D.   On: 08/20/2023 17:35     Assessment and Plan:   1.Acute on chronic HFpEF - 02/2023 echo: LVE 65-70%, indet diastolic, RV low normal function, - CXR small effusions, probable edema. BNP 687 -on IV lasix 40mg  bid, I/Os are  incomplete, weights appear inaccurate. Further uptrend in Cr with diuresis - clinic wt 10/21 106 lbs, reported weight 121 lbs today. Will clarify this was a standing weight.  - had been off diuretics since 07/2023 admission with cystitis and AKI - check reds vest  - remains volume overloaded. Significant decline in renal function, we will defer diuretic dosing to nephrology - recehck echo to see if any change in cardiac function playing a role in her worsening fluid status.   2. Pulmonary HTN/RV dysfunction - followed by HF clinic - has been on tadalafil   3. AKI on CKD - per nephrology   4. History of mechanical AVR - has had chronic mean gradient in the 20s - continue coumadin, dosing per pharmacy - repeat echo pending    5. PAF - from clinic notes had been longstanding persistent afib, however on presentation was in sinus brady but subsequently back into afib with variable rates. Her home toprol was held on admission, restart - on coumadin in setting of afib, mechanical AVR - from notes off av nodal agents due to low heart rates - rates primarily controlled, no sustained tachycardia. Continue to monitor  6. History of pANCA vasculitis    For questions or updates, please contact [facility] Please consult [email or web address] for contact info under    Signed, Dina Rich, MD  08/21/2023 8:42 AM

## 2023-08-21 NOTE — Progress Notes (Signed)
*  PRELIMINARY RESULTS* Echocardiogram 2D Echocardiogram has not been performed. Patient just starting to eat.  [name] 08/21/2023, 3:53 PM

## 2023-08-21 NOTE — Plan of Care (Signed)
  Problem: Acute Rehab PT Goals(only PT should resolve) Goal: Pt Will Go Supine/Side To Sit Outcome: Progressing Flowsheets (Taken 08/21/2023 1339) Pt will go Supine/Side to Sit:  with modified independence  with supervision Goal: Patient Will Transfer Sit To/From Stand Outcome: Progressing Flowsheets (Taken 08/21/2023 1339) Patient will transfer sit to/from stand:  with modified independence  with supervision Goal: Pt Will Transfer Bed To Chair/Chair To Bed Outcome: Progressing Flowsheets (Taken 08/21/2023 1339) Pt will Transfer Bed to Chair/Chair to Bed:  with modified independence  with supervision Goal: Pt Will Ambulate Outcome: Progressing Flowsheets (Taken 08/21/2023 1339) Pt will Ambulate:  75 feet  with supervision  with contact guard assist  with rolling walker   1:40 PM, 08/21/23 Ocie Bob, MPT Physical Therapist with [facility] 336 [phone] office [phone] mobile phone

## 2023-08-21 NOTE — Plan of Care (Signed)

## 2023-08-21 NOTE — Evaluation (Signed)
Physical Therapy Evaluation Patient Details Name: [name] MRN: [id number] DOB: 22-Jul-1941 Today's Date: 08/21/2023  History of Present Illness  [name] is a 82 year old female with extensive history of pulmonary hypertension, A-fib,h/o GI bleed, PAD, HTN, DM2, CKD 4, diastolic CHF, AIHA, emphysematous cystitis, mechanical aortic valve  ... Recent hospitalization discharged on 08/09/23 for emphysematous cystitis related Klebsiella.  With ESBL.  Treated with IV gentamicin, also on last admission received 1 unit of PRBC transfusion.     Presented to ED with a chief complaint of not feeling well, poor p.o. intake, generalized weakness.  Forted that she was taken off her Lasix by her cardiologist last week   Clinical Impression  Patient demonstrates slightly labored movement for sitting up at bedside and limited to a few steps at bedside before requesting to sit due to c/o fatigue and declined attempting to walk in room.  Patient on room air during functional activities with SpO2 at 95% on room air.  Patient tolerated sitting up in chair after therapy with her daughter present.  Patient will benefit from continued skilled physical therapy in hospital and recommended venue below to increase strength, balance, endurance for safe ADLs and gait.          If plan is discharge home, recommend the following: A little help with walking and/or transfers;A little help with bathing/dressing/bathroom;Help with stairs or ramp for entrance;Assistance with cooking/housework   Can travel by private vehicle        Equipment Recommendations None recommended by PT  Recommendations for Other Services       Functional Status Assessment Patient has had a recent decline in their functional status and demonstrates the ability to make significant improvements in function in a reasonable and predictable amount of time.     Precautions / Restrictions Precautions Precautions: Fall Restrictions Weight  Bearing Restrictions: No      Mobility  Bed Mobility Overal bed mobility: Needs Assistance Bed Mobility: Supine to Sit     Supine to sit: Supervision, HOB elevated     General bed mobility comments: increased time with HOB partially raised    Transfers Overall transfer level: Needs assistance Equipment used: Rolling walker (2 wheels) Transfers: Sit to/from Stand, Bed to chair/wheelchair/BSC Sit to Stand: Contact guard assist, Min assist   Step pivot transfers: Contact guard assist, Min assist       General transfer comment: slightly unsteady labored movement    Ambulation/Gait Ambulation/Gait assistance: Min assist Gait Distance (Feet): 4 Feet Assistive device: Rolling walker (2 wheels) Gait Pattern/deviations: Decreased step length - right, Decreased step length - left, Decreased stride length Gait velocity: slow     General Gait Details: limitd to a few slow labored steps at bedside without loss of balance using RW, limited mostly due to c/o fatigue  Stairs            Wheelchair Mobility     Tilt Bed    Modified Rankin (Stroke Patients Only)       Balance Overall balance assessment: Needs assistance Sitting-balance support: Feet supported, No upper extremity supported Sitting balance-Leahy Scale: Fair Sitting balance - Comments: fair/good seated at EOB   Standing balance support: During functional activity, Bilateral upper extremity supported Standing balance-Leahy Scale: Fair Standing balance comment: fair/good using RW                             Pertinent Vitals/Pain Pain Assessment Pain Assessment: No/denies pain  Home Living Family/patient expects to be discharged to:: Private residence Living Arrangements: Spouse/significant other Available Help at Discharge: Family;Available 24 hours/day Type of Home: House Home Access: Stairs to enter Entrance Stairs-Rails: Left Entrance Stairs-Number of Steps: 3   Home Layout:  One level Home Equipment: Agricultural consultant (2 wheels);Cane - single point;Shower seat;Crutches;Grab bars - toilet;Wheelchair - manual;Grab bars - tub/shower;Rollator (4 wheels)      Prior Function Prior Level of Function : Needs assist       Physical Assist : Mobility (physical);ADLs (physical) Mobility (physical): Bed mobility;Transfers;Gait;Stairs   Mobility Comments: Household ambulator with rollator and RW ADLs Comments: Independent with ADLs, family assist with iADLs     Extremity/Trunk Assessment   Upper Extremity Assessment Upper Extremity Assessment: Defer to OT evaluation    Lower Extremity Assessment Lower Extremity Assessment: Generalized weakness    Cervical / Trunk Assessment Cervical / Trunk Assessment: Normal  Communication   Communication Communication: No apparent difficulties  Cognition Arousal: Alert Behavior During Therapy: WFL for tasks assessed/performed Overall Cognitive Status: Within Functional Limits for tasks assessed                                          General Comments      Exercises     Assessment/Plan    PT Assessment Patient needs continued PT services  PT Problem List Decreased strength;Decreased activity tolerance;Decreased balance;Decreased mobility       PT Treatment Interventions DME instruction;Gait training;Stair training;Functional mobility training;Therapeutic activities;Therapeutic exercise;Balance training;Patient/family education    PT Goals (Current goals can be found in the Care Plan section)  Acute Rehab PT Goals Patient Stated Goal: return home with family to assist PT Goal Formulation: With patient/family Time For Goal Achievement: 08/28/23 Potential to Achieve Goals: Good    Frequency Min 3X/week     Co-evaluation               AM-PAC PT "6 Clicks" Mobility  Outcome Measure Help needed turning from your back to your side while in a flat bed without using bedrails?: None Help  needed moving from lying on your back to sitting on the side of a flat bed without using bedrails?: A Little Help needed moving to and from a bed to a chair (including a wheelchair)?: A Little Help needed standing up from a chair using your arms (e.g., wheelchair or bedside chair)?: A Little Help needed to walk in hospital room?: A Little Help needed climbing 3-5 steps with a railing? : A Lot 6 Click Score: 18    End of Session   Activity Tolerance: Patient tolerated treatment well;Patient limited by fatigue Patient left: in chair;with call bell/phone within reach;with family/visitor present Nurse Communication: Mobility status PT Visit Diagnosis: Unsteadiness on feet (R26.81);Other abnormalities of gait and mobility (R26.89);Muscle weakness (generalized) (M62.81)    Time: 5409-8119 PT Time Calculation (min) (ACUTE ONLY): 29 min   Charges:   PT Evaluation $PT Eval Moderate Complexity: 1 Mod PT Treatments $Therapeutic Activity: 23-37 mins PT General Charges $$ ACUTE PT VISIT: 1 Visit         1:38 PM, 08/21/23 Ocie Bob, MPT Physical Therapist with [facility] [phone] office [phone] mobile phone

## 2023-08-22 ENCOUNTER — Ambulatory Visit: Payer: Medicare HMO

## 2023-08-22 ENCOUNTER — Other Ambulatory Visit: Payer: Self-pay | Admitting: *Deleted

## 2023-08-22 DIAGNOSIS — I4819 Other persistent atrial fibrillation: Secondary | ICD-10-CM

## 2023-08-22 DIAGNOSIS — Z7189 Other specified counseling: Secondary | ICD-10-CM | POA: Diagnosis not present

## 2023-08-22 DIAGNOSIS — D638 Anemia in other chronic diseases classified elsewhere: Secondary | ICD-10-CM

## 2023-08-22 DIAGNOSIS — Z515 Encounter for palliative care: Secondary | ICD-10-CM | POA: Diagnosis not present

## 2023-08-22 DIAGNOSIS — R627 Adult failure to thrive: Secondary | ICD-10-CM

## 2023-08-22 DIAGNOSIS — N178 Other acute kidney failure: Secondary | ICD-10-CM | POA: Diagnosis not present

## 2023-08-22 DIAGNOSIS — N184 Chronic kidney disease, stage 4 (severe): Secondary | ICD-10-CM | POA: Diagnosis not present

## 2023-08-22 DIAGNOSIS — I272 Pulmonary hypertension, unspecified: Secondary | ICD-10-CM

## 2023-08-22 LAB — CBC
HCT: 22.6 % — ABNORMAL LOW (ref 36.0–46.0)
Hemoglobin: 7 g/dL — ABNORMAL LOW (ref 12.0–15.0)
MCH: 31.7 pg (ref 26.0–34.0)
MCHC: 31 g/dL (ref 30.0–36.0)
MCV: 102.3 fL — ABNORMAL HIGH (ref 80.0–100.0)
Platelets: 108 10*3/uL — ABNORMAL LOW (ref 150–400)
RBC: 2.21 MIL/uL — ABNORMAL LOW (ref 3.87–5.11)
RDW: 16.7 % — ABNORMAL HIGH (ref 11.5–15.5)
WBC: 7.3 10*3/uL (ref 4.0–10.5)
nRBC: 0 % (ref 0.0–0.2)

## 2023-08-22 LAB — COMPREHENSIVE METABOLIC PANEL
ALT: 12 U/L (ref 0–44)
AST: 24 U/L (ref 15–41)
Albumin: 4.3 g/dL (ref 3.5–5.0)
Alkaline Phosphatase: 80 U/L (ref 38–126)
Anion gap: 8 (ref 5–15)
BUN: 91 mg/dL — ABNORMAL HIGH (ref 8–23)
CO2: 19 mmol/L — ABNORMAL LOW (ref 22–32)
Calcium: 8.3 mg/dL — ABNORMAL LOW (ref 8.9–10.3)
Chloride: 105 mmol/L (ref 98–111)
Creatinine, Ser: 5.3 mg/dL — ABNORMAL HIGH (ref 0.44–1.00)
GFR, Estimated: 8 mL/min — ABNORMAL LOW (ref >60)
Glucose, Bld: 102 mg/dL — ABNORMAL HIGH (ref 70–99)
Potassium: 4.9 mmol/L (ref 3.5–5.1)
Sodium: 132 mmol/L — ABNORMAL LOW (ref 135–145)
Total Bilirubin: 1.1 mg/dL (ref <1.2)
Total Protein: 5.7 g/dL — ABNORMAL LOW (ref 6.5–8.1)

## 2023-08-22 LAB — IRON AND TIBC
Iron: 34 ug/dL (ref 28–170)
Saturation Ratios: 16 % (ref 10.4–31.8)
TIBC: 208 ug/dL — ABNORMAL LOW (ref 250–450)
UIBC: 174 ug/dL

## 2023-08-22 LAB — GLUCOSE, CAPILLARY
Glucose-Capillary: 95 mg/dL (ref 70–99)
Glucose-Capillary: 116 mg/dL — ABNORMAL HIGH (ref 70–99)
Glucose-Capillary: 94 mg/dL (ref 70–99)
Glucose-Capillary: 99 mg/dL (ref 70–99)

## 2023-08-22 LAB — PROTIME-INR
INR: 2.6 — ABNORMAL HIGH (ref 0.8–1.2)
Prothrombin Time: 27.8 s — ABNORMAL HIGH (ref 11.4–15.2)

## 2023-08-22 MED ORDER — FAMOTIDINE 20 MG PO TABS
20.0000 mg | ORAL_TABLET | Freq: Every day | ORAL | Status: DC
  Administered 2023-08-23: 20 mg via ORAL
  Filled 2023-08-22: qty 1

## 2023-08-22 MED ORDER — WARFARIN - PHARMACIST DOSING INPATIENT: Freq: Every day | Status: DC

## 2023-08-22 MED ORDER — WARFARIN SODIUM 5 MG PO TABS
5.0000 mg | ORAL_TABLET | Freq: Once | ORAL | Status: AC
  Administered 2023-08-22: 5 mg via ORAL
  Filled 2023-08-22: qty 1

## 2023-08-22 NOTE — Progress Notes (Signed)
PROGRESS NOTE    [name]  GNF:[id number] DOB: December 08, 1940 DOA: 08/20/2023 PCP: Gaspar Garbe, MD   Brief Narrative:    [name] is a 82 year old female with extensive history of pulmonary hypertension, A-fib,h/o GI bleed, PAD, HTN, DM2, CKD 4, diastolic CHF, AIHA, emphysematous cystitis, mechanical aortic valve ... Recent hospitalization discharged on 08/09/23 for emphysematous cystitis related Klebsiella.  With ESBL.  Treated with IV gentamicin, also on last admission received 1 unit of PRBC transfusion.   Presented to ED with a chief complaint of not feeling well, poor p.o. intake, generalized weakness. Forted that she was taken off her Lasix by her cardiologist last week.  She was admitted with AKI on CKD stage IV with volume overload and associated acute on chronic diastolic CHF exacerbation for which she was started on IV diuresis at the direction of cardiology as well as nephrology.  She now appears euvolemic and is not a candidate for hemodialysis.  Palliative as discussed with patient and she is agreeable to home hospice which will be arranged by 11/14.  Assessment & Plan:   Principal Problem:   Acute renal failure superimposed on stage 4 chronic kidney disease (HCC) Active Problems:   Chronic diastolic CHF (congestive heart failure) (HCC)   H/O mechanical aortic valve replacement   Acute on chronic anemia   Chronic anticoagulation   Anxiety   Diabetes mellitus type 2, controlled (HCC)   Bilateral lower extremity edema   DNR (do not resuscitate)/DNI(Do Not Intubate)   Hyponatremia   Essential hypertension   Hypercholesteremia   Thrombocytopenia (HCC)   Chronic atrial fibrillation (HCC)   Hyperkalemia   PAH (pulmonary artery hypertension) (HCC)   Anemia of chronic disease   Hypoalbuminemia due to protein-calorie malnutrition (HCC)   CAD, multiple vessel  Assessment and Plan:   Acute renal failure superimposed on stage 4 chronic kidney disease  (HCC) -Acute on chronic kidney disease stage IV -Elevated creatinine from baseline -Avoid all nephrotoxins-including NSAIDs, morphine, baclofen, sodium phosphate, magnesium.[name]. -Per nephrology, holding diuresis for now as patient appears euvolemic   -Patient is not a candidate for hemodialysis due to multiple COVID disease, 82 years old   Cardiorenal syndrome -Management as above, appreciate cardiology nephrology follow-up close   Chronic diastolic CHF (congestive heart failure) (HCC) - Acute on chronic diastolic CHF with exacerbation Of severe shortness of breath, generalized weakness, bilateral lower extremity edema -Continue home medications  -LVEF 65-70% with grade 2 diastolic dysfunction     Acute on chronic anemia - Of chronic disease-multifactorial, including iron deficiency - Patient follows with hematology as an outpatient, -Family received Granix and blood transfusion -Last blood transfusion 1 unit of PRBC on last admission October 2024 -Monitoring CBC closely, avoid further transfusions -Continue iron supplements   H/O mechanical aortic valve replacement - On Coumadin -pharmacy dosing -Therapeutic INR 3.7 >>    DNR (do not resuscitate)/DNI(Do Not Intubate) - Confirmed DNR/DNI status    Bilateral lower extremity edema - Due to CHF exacerbation -Holding lasix   CKD (chronic kidney disease), stage IV (HCC) Acute on chronic CKD stage IV -Continue to monitor, elevated creatinine from baseline Avoiding other nephrotoxins   Diabetes mellitus type 2, controlled (HCC) -Holding home medication -Checking CBG q. ACHS, SSI coverage -Last A1c 6.22 weeks ago   Chronic anticoagulation - On Coumadin due to mechanical aortic valve and A-fib -INR goal 2.5-3.5 INR currently therapeutic at 3.7 -Consulting pharmacy to monitor and adjust dose of Coumadin   Hyponatremia Serum sodium  at 129 likely due to volume overload, Baseline sodium 131-135 -Holding further IV  diuresis -Monitor serum sodium closely   CAD, multiple vessel Continue anticoagulation, continue statins,   Hypoalbuminemia due to protein-calorie malnutrition (HCC) - Will monitor and replete accordingly -Courage p.o. intake   PAH (pulmonary artery hypertension) (HCC) - Continue tadalafil   Chronic atrial fibrillation (HCC) - Continue rate control meds  - continue Coumadin   Thrombocytopenia (HCC) - On Coumadin, monitoring platelets closely   Hypercholesteremia - Continue statins   Essential hypertension - Stable, reviewing home medication resuming accordingly    DVT prophylaxis:SCDs Code Status: DNR Family Communication: None at bedside Disposition Plan:  Status is: Inpatient Remains inpatient appropriate because: Need for IV medications   Consultants:  Nephrology Cardiology Palliative  Procedures:  None  Antimicrobials:  None   Subjective: Patient seen and evaluated today with no new acute complaints or concerns. No acute concerns or events noted overnight.  She appears agreeable for home hospice at this point.  Objective: Vitals:   08/22/23 0536 08/22/23 0710 08/22/23 1043 08/22/23 1312  BP: (!) 132/48  (!) 166/55 (!) 143/63  Pulse: 62   94  Resp: 18   18  Temp: (!) 97.5 F (36.4 C)   98 F (36.7 C)  TempSrc: Oral   Axillary  SpO2: 98% 96%  99%  Weight:      Height:        Intake/Output Summary (Last 24 hours) at 08/22/2023 1321 Last data filed at 08/22/2023 0500 Gross per 24 hour  Intake 701 ml  Output 700 ml  Net 1 ml   Filed Weights   08/21/23 0500 08/21/23 1906 08/22/23 0500  Weight: 54.9 kg 54.8 kg 53.6 kg    Examination:  General exam: Appears calm and comfortable  Respiratory system: Clear to auscultation. Respiratory effort normal. Cardiovascular system: S1 & S2 heard, RRR.  Gastrointestinal system: Abdomen is soft Central nervous system: Alert and awake Extremities: No edema Skin: No significant lesions  noted Psychiatry: Flat affect.    Data Reviewed: I have personally reviewed following labs and imaging studies  CBC: Recent Labs  Lab 08/20/23 1212 08/21/23 0512 08/22/23 0528  WBC 6.0 6.1 7.3  NEUTROABS 4.2  --   --   HGB 8.4* 7.3* 7.0*  HCT 26.1* 24.0* 22.6*  MCV 100.8* 101.3* 102.3*  PLT 136* 126* 108*   Basic Metabolic Panel: Recent Labs  Lab 08/16/23 1107 08/20/23 1212 08/21/23 0512 08/22/23 0528  NA 135 129* 134* 132*  K 4.9 5.4* 5.4* 4.9  CL 105 101 102 105  CO2 19* 18* 20* 19*  GLUCOSE 187* 176* 101* 102*  BUN 68* 88* 90* 91*  CREATININE 3.22* 4.92* 5.27* 5.30*  CALCIUM 8.2* 8.3* 9.1 8.3*  MG  --  2.7*  --   --   PHOS  --  4.7*  --   --    GFR: Estimated Creatinine Clearance: 5.4 mL/min (A) (by C-G formula based on SCr of 5.3 mg/dL (H)). Liver Function Tests: Recent Labs  Lab 08/20/23 1212 08/21/23 0512 08/22/23 0528  AST 22 20 24   ALT 13 10 12   ALKPHOS 108 85 80  BILITOT 0.5 0.8 1.1  PROT 5.5* 6.0* 5.7*  ALBUMIN 2.9* 4.0 4.3   Recent Labs  Lab 08/20/23 1212  LIPASE 39   No results for input(s): "AMMONIA" in the last 168 hours. Coagulation Profile: Recent Labs  Lab 08/20/23 1212 08/21/23 0512 08/22/23 0528  INR 3.7* 3.7* 2.6*   Cardiac  Enzymes: No results for input(s): "CKTOTAL", "CKMB", "CKMBINDEX", "TROPONINI" in the last 168 hours. BNP (last 3 results) No results for input(s): "PROBNP" in the last 8760 hours. HbA1C: No results for input(s): "HGBA1C" in the last 72 hours. CBG: Recent Labs  Lab 08/21/23 1138 08/21/23 1606 08/21/23 2051 08/22/23 0741 08/22/23 1128  GLUCAP 118* 112* 134* 95 116*   Lipid Profile: No results for input(s): "CHOL", "HDL", "LDLCALC", "TRIG", "CHOLHDL", "LDLDIRECT" in the last 72 hours. Thyroid Function Tests: No results for input(s): "TSH", "T4TOTAL", "FREET4", "T3FREE", "THYROIDAB" in the last 72 hours. Anemia Panel: Recent Labs    08/22/23 0528  TIBC 208*  IRON 34   Sepsis Labs: No  results for input(s): "PROCALCITON", "LATICACIDVEN" in the last 168 hours.  Recent Results (from the past 240 hour(s))  Resp panel by RT-PCR (RSV, Flu A&B, Covid) Anterior Nasal Swab     Status: None   Collection Time: 08/20/23 12:00 PM   Specimen: Anterior Nasal Swab  Result Value Ref Range Status   SARS Coronavirus 2 by RT PCR NEGATIVE NEGATIVE Final    Comment: (NOTE) SARS-CoV-2 target nucleic acids are NOT DETECTED.  The SARS-CoV-2 RNA is generally detectable in upper respiratory specimens during the acute phase of infection. The lowest concentration of SARS-CoV-2 viral copies this assay can detect is 138 copies/mL. A negative result does not preclude SARS-Cov-2 infection and should not be used as the sole basis for treatment or other patient management decisions. A negative result may occur with  improper specimen collection/handling, submission of specimen other than nasopharyngeal swab, presence of viral mutation(s) within the areas targeted by this assay, and inadequate number of viral copies(<138 copies/mL). A negative result must be combined with clinical observations, patient history, and epidemiological information. The expected result is Negative.  Fact Sheet for Patients:  BloggerCourse.com  Fact Sheet for Healthcare Providers:  SeriousBroker.it  This test is no t yet approved or cleared by the [place] FDA and  has been authorized for detection and/or diagnosis of SARS-CoV-2 by FDA under an Emergency Use Authorization (EUA). This EUA will remain  in effect (meaning this test can be used) for the duration of the COVID-19 declaration under Section 564(b)(1) of the Act, 21 U.S.C.section 360bbb-3(b)(1), unless the authorization is terminated  or revoked sooner.       Influenza A by PCR NEGATIVE NEGATIVE Final   Influenza B by PCR NEGATIVE NEGATIVE Final    Comment: (NOTE) The Xpert Xpress SARS-CoV-2/FLU/RSV  plus assay is intended as an aid in the diagnosis of influenza from Nasopharyngeal swab specimens and should not be used as a sole basis for treatment. Nasal washings and aspirates are unacceptable for Xpert Xpress SARS-CoV-2/FLU/RSV testing.  Fact Sheet for Patients: BloggerCourse.com  Fact Sheet for Healthcare Providers: SeriousBroker.it  This test is not yet approved or cleared by the [place] FDA and has been authorized for detection and/or diagnosis of SARS-CoV-2 by FDA under an Emergency Use Authorization (EUA). This EUA will remain in effect (meaning this test can be used) for the duration of the COVID-19 declaration under Section 564(b)(1) of the Act, 21 U.S.C. section 360bbb-3(b)(1), unless the authorization is terminated or revoked.     Resp Syncytial Virus by PCR NEGATIVE NEGATIVE Final    Comment: (NOTE) Fact Sheet for Patients: BloggerCourse.com  Fact Sheet for Healthcare Providers: SeriousBroker.it  This test is not yet approved or cleared by the [place] FDA and has been authorized for detection and/or diagnosis of SARS-CoV-2 by FDA under an  Emergency Use Authorization (EUA). This EUA will remain in effect (meaning this test can be used) for the duration of the COVID-19 declaration under Section 564(b)(1) of the Act, 21 U.S.C. section 360bbb-3(b)(1), unless the authorization is terminated or revoked.  Performed at [facility], [address], [place], [place] [address]   MRSA Next Gen by PCR, Nasal     Status: None   Collection Time: 08/20/23  3:00 PM   Specimen: Nasal Mucosa; Nasal Swab  Result Value Ref Range Status   MRSA by PCR Next Gen NOT DETECTED NOT DETECTED Final    Comment: (NOTE) The GeneXpert MRSA Assay (FDA approved for NASAL specimens only), is one component of a comprehensive MRSA colonization surveillance program. It is not intended  to diagnose MRSA infection nor to guide or monitor treatment for MRSA infections. Test performance is not FDA approved in patients less than 92 years old. Performed at [facility], [address], [place], [place] [address]          Radiology Studies: ECHOCARDIOGRAM COMPLETE  Result Date: 08/21/2023    ECHOCARDIOGRAM REPORT   Patient Name:   HULA TORNETTA Date of Exam: 08/21/2023 Medical Rec #:  [id number]      Height:       56.0 in Accession #:    [id number]     Weight:       121.0 lb Date of Birth:  1941-07-24      BSA:          1.434 m Patient Age:    82 years       BP:           158/38 mmHg Patient Gender: F              HR:           88 bpm. [facility] Procedure: 2D Echo, Cardiac Doppler and Color Doppler Indications:    Dyspnea R06.00  History:        Patient has prior history of Echocardiogram examinations, most                 recent 02/23/2023. CHF, CAD, Arrythmias:Atrial Fibrillation; Risk                 Factors:Hypertension and Diabetes. H/O mechanical aortic valve                 replacement- St. Jude.  Sonographer:    Celesta Gentile RCS Referring Phys: [id number] [name] BRANCH IMPRESSIONS  1. Left ventricular ejection fraction, by estimation, is 65 to 70%. The left ventricle has normal function. The left ventricle has no regional wall motion abnormalities. There is moderate concentric left ventricular hypertrophy. Left ventricular diastolic parameters are consistent with Grade II diastolic dysfunction (pseudonormalization). Elevated left atrial pressure.  2. Right ventricular systolic function is low normal. The right ventricular size is mildly enlarged. There is mildly elevated pulmonary artery systolic pressure.  3. Left atrial size was severely dilated.  4. Right atrial size was severely dilated.  5. The mitral valve is abnormal. Mild mitral valve regurgitation. Mild mitral stenosis. The mean mitral valve gradient is 3.0 mmHg. Heart rate is 65 bpm. Moderate mitral  annular calcification.  6. The tricuspid valve is abnormal.  7. St. Jude mechanical valve present in the aortic position. Stable mild mean gradient across valve of 22 mmHg.     . The aortic valve has been repaired/replaced. Aortic valve regurgitation is not visualized. Aortic valve  mean gradient measures 22.0 mmHg.  8. The inferior vena cava is dilated in size with <50% respiratory variability, suggesting right atrial pressure of 15 mmHg. FINDINGS  Left Ventricle: Left ventricular ejection fraction, by estimation, is 65 to 70%. The left ventricle has normal function. The left ventricle has no regional wall motion abnormalities. The left ventricular internal cavity size was normal in size. There is  moderate concentric left ventricular hypertrophy. Left ventricular diastolic parameters are consistent with Grade II diastolic dysfunction (pseudonormalization). Elevated left atrial pressure. Right Ventricle: The right ventricular size is mildly enlarged. Right vetricular wall thickness was not well visualized. Right ventricular systolic function is low normal. There is mildly elevated pulmonary artery systolic pressure. The tricuspid regurgitant velocity is 2.88 m/s, and with an assumed right atrial pressure of 8 mmHg, the estimated right ventricular systolic pressure is 41.2 mmHg. Left Atrium: Left atrial size was severely dilated. Right Atrium: Right atrial size was severely dilated. Pericardium: There is no evidence of pericardial effusion. Mitral Valve: The mitral valve is abnormal. There is moderate thickening of the mitral valve leaflet(s). There is moderate calcification of the mitral valve leaflet(s). Moderate mitral annular calcification. Mild mitral valve regurgitation. Mild mitral valve stenosis. MV peak gradient, 16.2 mmHg. The mean mitral valve gradient is 3.0 mmHg. Tricuspid Valve: The tricuspid valve is abnormal. Tricuspid valve regurgitation is mild . No evidence of tricuspid stenosis. Aortic Valve: St.  Jude mechanical valve present in the aortic position. Stable mild mean gradient across valve of 22 mmHg. The aortic valve has been repaired/replaced. Aortic valve regurgitation is not visualized. Aortic valve mean gradient measures 22.0 mmHg. Aortic valve peak gradient measures 39.9 mmHg. Aortic valve area, by VTI measures 0.99 cm. Pulmonic Valve: The pulmonic valve was not well visualized. Pulmonic valve regurgitation is mild. No evidence of pulmonic stenosis. Aorta: The aortic root is normal in size and structure. Venous: The inferior vena cava is dilated in size with less than 50% respiratory variability, suggesting right atrial pressure of 15 mmHg. IAS/Shunts: No atrial level shunt detected by color flow Doppler.  LEFT VENTRICLE PLAX 2D LVIDd:         5.10 cm   Diastology LVIDs:         2.50 cm   LV e' medial:    4.79 cm/s LV PW:         1.40 cm   LV E/e' medial:  41.3 LV IVS:        1.20 cm   LV e' lateral:   5.77 cm/s LVOT diam:     1.70 cm   LV E/e' lateral: 34.3 LV SV:         76 LV SV Index:   53 LVOT Area:     2.27 cm  RIGHT VENTRICLE RV S prime:     7.40 cm/s TAPSE (M-mode): 1.8 cm LEFT ATRIUM              Index        RIGHT ATRIUM           Index LA diam:        4.60 cm  3.21 cm/m   RA Area:     27.30 cm LA Vol (A2C):   136.0 ml 94.84 ml/m  RA Volume:   90.80 ml  63.32 ml/m LA Vol (A4C):   101.0 ml 70.43 ml/m LA Biplane Vol: 115.0 ml 80.20 ml/m  AORTIC VALVE AV Area (Vmax):    0.96 cm AV Area (Vmean):  1.04 cm AV Area (VTI):     0.99 cm AV Vmax:           316.00 cm/s AV Vmean:          220.000 cm/s AV VTI:            0.769 m AV Peak Grad:      39.9 mmHg AV Mean Grad:      22.0 mmHg LVOT Vmax:         133.00 cm/s LVOT Vmean:        101.000 cm/s LVOT VTI:          0.336 m LVOT/AV VTI ratio: 0.44  AORTA Ao Root diam: 3.00 cm MITRAL VALVE                TRICUSPID VALVE MV Area (PHT): 2.99 cm     TR Peak grad:   33.2 mmHg MV Area VTI:   1.56 cm     TR Vmax:        288.00 cm/s MV Peak grad:  16.2  mmHg MV Mean grad:  3.0 mmHg     SHUNTS MV Vmax:       2.01 m/s     Systemic VTI:  0.34 m MV Vmean:      72.2 cm/s    Systemic Diam: 1.70 cm MV Decel Time: 254 msec MV E velocity: 198.00 cm/s MV A velocity: 55.60 cm/s MV E/A ratio:  3.56 Dina Rich MD Electronically signed by Dina Rich MD Signature Date/Time: 08/21/2023/5:46:36 PM    Final    DG Chest Portable 1 View  Result Date: 08/20/2023 CLINICAL DATA:  Shortness of breath EXAM: PORTABLE CHEST 1 VIEW COMPARISON:  03/26/2023 FINDINGS: Post sternotomy changes. Cardiomegaly with small pleural effusions and diffuse increased interstitial and mild ground-glass opacities suspicious for edema. Calcified lung nodules bilaterally consistent with granuloma. Mild focal airspace disease at the left greater than right lung base. IMPRESSION: Cardiomegaly with small pleural effusions and diffuse increased interstitial and mild ground-glass opacities suspicious for edema. Mild focal airspace disease at the left greater than right lung base, atelectasis versus pneumonia. Electronically Signed   By: Jasmine Pang M.D.   On: 08/20/2023 17:35        Scheduled Meds:  allopurinol  100 mg Oral Daily   ALPRAZolam  0.5 mg Oral QHS   amLODipine  10 mg Oral QHS   atorvastatin  80 mg Oral q1800   budesonide  0.5 mg Nebulization BID   cloNIDine  0.3 mg Oral BID   famotidine  20 mg Oral BID   ferrous sulfate  650 mg Oral Q breakfast   hydrALAZINE  100 mg Oral TID   insulin aspart  0-9 Units Subcutaneous TID WC   insulin glargine-yfgn  5 Units Subcutaneous QHS   ondansetron (ZOFRAN) IV  4 mg Intravenous Once   pantoprazole  40 mg Oral BID WC   sertraline  25 mg Oral QHS   sodium bicarbonate  650 mg Oral BID   sodium chloride flush  3 mL Intravenous Q12H   sodium chloride flush  3 mL Intravenous Q12H   sodium zirconium cyclosilicate  10 g Oral Daily   tadalafil (PAH)  40 mg Oral QHS   warfarin  5 mg Oral ONCE-1600   Warfarin - Pharmacist Dosing  Inpatient   Does not apply q1600   Continuous Infusions:  sodium chloride       LOS: 2 days    Time spent: 35 minutes  Trena Dunavan Hoover Brunette, DO [facility]  If 7PM-7AM, please contact night-coverage [email or web address] 08/22/2023, 1:21 PM

## 2023-08-22 NOTE — Progress Notes (Addendum)
Physical Therapy Treatment Patient Details Name: [name] MRN: [id number] DOB: 1941-06-14 Today's Date: 08/22/2023   History of Present Illness [name] is a 82 year old female with extensive history of pulmonary hypertension, A-fib,h/o GI bleed, PAD, HTN, DM2, CKD 4, diastolic CHF, AIHA, emphysematous cystitis, mechanical aortic valve  ... Recent hospitalization discharged on 08/09/23 for emphysematous cystitis related Klebsiella.  With ESBL.  Treated with IV gentamicin, also on last admission received 1 unit of PRBC transfusion.     Presented to ED with a chief complaint of not feeling well, poor p.o. intake, generalized weakness.  Forted that she was taken off her Lasix by her cardiologist last week    PT Comments  Pt was tolerant to today's treatment session, but did show level of hesitation with mobility after initial ambulation bout to bathroom. Today's session addressed functional transfer training and ambulation practice navigating small, busy environment. Pt noted with reduced awareness of DME and surrounding objects but no balance deficits observed. Does continue to slow, labored functional mobility due to muscle weakness and deconditioning. Progressing ambulation tolerance this session but was too tired and hungry for further mobility. Pt was left in recliner and SpO2 monitored with 88%, pt educated on purse lip breathing to assist to improve oxygenation, increased to 94% with 1-2 min rest break. Pt would continue to benefit from skilled acute physical therapy services in order to progress toward POC goals, safety/independence with functional mobility and QOL.     If plan is discharge home, recommend the following: A little help with walking and/or transfers;A little help with bathing/dressing/bathroom;Help with stairs or ramp for entrance;Assistance with cooking/housework   Can travel by private vehicle        Equipment Recommendations  None recommended by PT     Recommendations for Other Services       Precautions / Restrictions Precautions Precautions: Fall Restrictions Weight Bearing Restrictions: No     Mobility  Bed Mobility Overal bed mobility: Needs Assistance             General bed mobility comments: Per OT note, OT assisted with bed mobility Patient Response: Cooperative  Transfers Overall transfer level: Needs assistance Equipment used: Rolling walker (2 wheels) Transfers: Sit to/from Stand Sit to Stand: Contact guard assist   Step pivot transfers: Contact guard assist       General transfer comment: slow, labored movement from EOB to RW, cues for proper hand management. controlled stand to sit from RW to toilet with, verbal cues given for hand placement to [facility] over toilet. Slow, labored transfer into sitting.    Ambulation/Gait Ambulation/Gait assistance: Contact guard assist Gait Distance (Feet): 24 Feet Assistive device: Rolling walker (2 wheels) Gait Pattern/deviations: Decreased step length - right, Decreased step length - left, Decreased stride length       General Gait Details: Pt ambulating @ CGA for balance from EOB to BSC over toilet and back to recliner 2 x 12 ft. , slow, labored, decreased steplength and tactile, directional, and verbal cues given for RW management in out of bathroom.   Stairs             Wheelchair Mobility     Tilt Bed Tilt Bed Patient Response: Cooperative  Modified Rankin (Stroke Patients Only)       Balance Overall balance assessment: Needs assistance Sitting-balance support: Feet supported, No upper extremity supported Sitting balance-Leahy Scale: Fair Sitting balance - Comments: fair/good seated at EOB   Standing balance support: During functional activity,  Bilateral upper extremity supported Standing balance-Leahy Scale: Fair Standing balance comment: fair/good using RW                            Cognition Arousal: Alert Behavior During  Therapy: WFL for tasks assessed/performed Overall Cognitive Status: Within Functional Limits for tasks assessed                                          Exercises      General Comments        Pertinent Vitals/Pain Pain Assessment Faces Pain Scale: Hurts little more Pain Location: rectum Pain Descriptors / Indicators: Moaning    Home Living Family/patient expects to be discharged to:: Private residence Living Arrangements: Spouse/significant other Available Help at Discharge: Family;Available 24 hours/day Type of Home: House Home Access: Stairs to enter Entrance Stairs-Rails: Left Entrance Stairs-Number of Steps: 3   Home Layout: One level Home Equipment: Agricultural consultant (2 wheels);Cane - single point;Shower seat;Crutches;Grab bars - toilet;Wheelchair - manual;Grab bars - tub/shower;Rollator (4 wheels);BSC/3in1 Additional Comments: per chart review    Prior Function            PT Goals (current goals can now be found in the care plan section) Acute Rehab PT Goals Patient Stated Goal: return home with family to assist PT Goal Formulation: With patient/family Time For Goal Achievement: 08/28/23 Potential to Achieve Goals: Good Progress towards PT goals: Progressing toward goals    Frequency    Min 3X/week      PT Plan      Co-evaluation PT/OT/SLP Co-Evaluation/Treatment: Yes Reason for Co-Treatment: To address functional/ADL transfers;For patient/therapist safety PT goals addressed during session: Strengthening/ROM;Mobility/safety with mobility OT goals addressed during session: ADL's and self-care      AM-PAC PT "6 Clicks" Mobility   Outcome Measure  Help needed turning from your back to your side while in a flat bed without using bedrails?: None Help needed moving from lying on your back to sitting on the side of a flat bed without using bedrails?: A Little Help needed moving to and from a bed to a chair (including a wheelchair)?: A  Little Help needed standing up from a chair using your arms (e.g., wheelchair or bedside chair)?: A Little Help needed to walk in hospital room?: A Little Help needed climbing 3-5 steps with a railing? : A Lot 6 Click Score: 18    End of Session   Activity Tolerance: Patient tolerated treatment well;Patient limited by fatigue Patient left: in chair;with call bell/phone within reach;with family/visitor present Nurse Communication: Mobility status PT Visit Diagnosis: Unsteadiness on feet (R26.81);Other abnormalities of gait and mobility (R26.89);Muscle weakness (generalized) (M62.81)     Time: 5284-1324 PT Time Calculation (min) (ACUTE ONLY): 24 min  Charges:    $Therapeutic Activity: 23-37 mins PT General Charges $$ ACUTE PT VISIT: 1 Visit                     Nelida Meuse PT, DPT Physical Therapist with [facility] [phone] office   Nelida Meuse 08/22/2023, 10:23 AM

## 2023-08-22 NOTE — Evaluation (Signed)
Occupational Therapy Evaluation Patient Details Name: PEJA EBLIN MRN: [id number] DOB: [birth year] Today's Date: 08/22/2023   History of Present Illness NAIARA SWEEN is a 82 year old female with extensive history of pulmonary hypertension, A-fib,h/o GI bleed, PAD, HTN, DM2, CKD 4, diastolic CHF, AIHA, emphysematous cystitis, mechanical aortic valve  ... Recent hospitalization discharged on 08/09/23 for emphysematous cystitis related Klebsiella.  With ESBL.  Treated with IV gentamicin, also on last admission received 1 unit of PRBC transfusion.     Presented to ED with a chief complaint of not feeling well, poor p.o. intake, generalized weakness.  Forted that she was taken off her Lasix by her cardiologist last week   Clinical Impression   Pt agreeable to OT and  PT co-evaluation. Pt required supervision assist for bed mobility and CGA for transfers. Able to don shoes and complete peri-care without physical assist. Nurse notified of pt's have blood when wiping after using the toilet. Pt is generally weak with slow labored movement. Pt left in the chair with PT present and working with the pt. Pt will benefit from continued OT in the hospital and recommended venue below to increase strength, balance, and endurance for safe ADL's.         If plan is discharge home, recommend the following: A little help with walking and/or transfers;A little help with bathing/dressing/bathroom;Assistance with cooking/housework;Assist for transportation;Help with stairs or ramp for entrance    Functional Status Assessment  Patient has had a recent decline in their functional status and demonstrates the ability to make significant improvements in function in a reasonable and predictable amount of time.  Equipment Recommendations  None recommended by OT           Precautions / Restrictions Precautions Precautions: Fall Restrictions Weight Bearing Restrictions: No      Mobility Bed Mobility Overal  bed mobility: Needs Assistance Bed Mobility: Supine to Sit     Supine to sit: HOB elevated, Supervision     General bed mobility comments: mild labored effort    Transfers Overall transfer level: Needs assistance Equipment used: Rolling walker (2 wheels) Transfers: Sit to/from Stand, Bed to chair/wheelchair/BSC Sit to Stand: Contact guard assist     Step pivot transfers: Contact guard assist     General transfer comment: slow labored movement; mild cuing for direction of RW.      Balance Overall balance assessment: Needs assistance Sitting-balance support: Feet supported, No upper extremity supported Sitting balance-Leahy Scale: Fair Sitting balance - Comments: fair/good seated at EOB   Standing balance support: During functional activity, Bilateral upper extremity supported Standing balance-Leahy Scale: Fair Standing balance comment: fair/good using RW                           ADL either performed or assessed with clinical judgement   ADL Overall ADL's : Needs assistance/impaired     Grooming: Contact guard assist;Standing;Wash/dry hands Grooming Details (indicate cue type and reason): Standing at the sink with the RW to wash hands. Upper Body Bathing: Contact guard assist;Set up;Sitting       Upper Body Dressing : Contact guard assist;Set up;Sitting   Lower Body Dressing: Contact guard assist;Set up;Sitting/lateral leans Lower Body Dressing Details (indicate cue type and reason): Able to don shoes without physical assist seated at the EOB. Toilet Transfer: Contact guard assist;Ambulation;Rolling walker (2 wheels) Toilet Transfer Details (indicate cue type and reason): EOB to toilet with BSC placed over it to increase the height.  Toileting- Clothing Manipulation and Hygiene: Supervision/safety;Contact guard assist;Sit to/from stand;Sitting/lateral lean Toileting - Clothing Manipulation Details (indicate cue type and reason): Able to compelte peri-care  with lateral leans and pull up briefs with CGA.     Functional mobility during ADLs: Contact guard assist;Rolling walker (2 wheels)       Vision Baseline Vision/History: 1 Wears glasses Ability to See in Adequate Light: 1 Impaired Patient Visual Report: No change from baseline Vision Assessment?: No apparent visual deficits     Perception Perception: Not tested       Praxis Praxis: Not tested       Pertinent Vitals/Pain Pain Assessment Pain Assessment: Faces Faces Pain Scale: Hurts little more Pain Location: rectum Pain Descriptors / Indicators: Moaning Pain Intervention(s): Limited activity within patient's tolerance, Monitored during session, Repositioned     Extremity/Trunk Assessment Upper Extremity Assessment Upper Extremity Assessment: Generalized weakness (Limited to ~75% A/ROM of shoulder flexion; reports arthritis.)   Lower Extremity Assessment Lower Extremity Assessment: Defer to PT evaluation   Cervical / Trunk Assessment Cervical / Trunk Assessment: Kyphotic   Communication Communication Communication: No apparent difficulties   Cognition Arousal: Alert Behavior During Therapy: WFL for tasks assessed/performed Overall Cognitive Status: Within Functional Limits for tasks assessed                                                        Home Living Family/patient expects to be discharged to:: Private residence Living Arrangements: Spouse/significant other Available Help at Discharge: Family;Available 24 hours/day Type of Home: House Home Access: Stairs to enter [employer] of Steps: 3 Entrance Stairs-Rails: Left Home Layout: One level     Bathroom Shower/Tub: Producer, television/film/video: Handicapped height Bathroom Accessibility: Yes   Home Equipment: Agricultural consultant (2 wheels);Cane - single point;Shower seat;Crutches;Grab bars - toilet;Wheelchair - manual;Grab bars - tub/shower;Rollator (4  wheels);BSC/3in1   Additional Comments: per chart review      Prior Functioning/Environment Prior Level of Function : Needs assist             Mobility Comments: Household ambulator with rollator and RW ADLs Comments: Independent with ADLs, family assist with iADLs. Does need assist for shower transfer.        OT Problem List: Decreased strength;Decreased range of motion;Decreased activity tolerance;Impaired balance (sitting and/or standing)      OT Treatment/Interventions: Self-care/ADL training;Therapeutic exercise;Therapeutic activities;Balance training;Patient/family education    OT Goals(Current goals can be found in the care plan section) Acute Rehab OT Goals Patient Stated Goal: return home OT Goal Formulation: With patient Time For Goal Achievement: 09/05/23 Potential to Achieve Goals: Good  OT Frequency: Min 1X/week    Co-evaluation PT/OT/SLP Co-Evaluation/Treatment: Yes Reason for Co-Treatment: To address functional/ADL transfers   OT goals addressed during session: ADL's and self-care                       End of Session Equipment Utilized During Treatment: Rolling walker (2 wheels);Gait belt  Activity Tolerance: Patient tolerated treatment well Patient left: in chair;with call bell/phone within reach  OT Visit Diagnosis: Unsteadiness on feet (R26.81);Other abnormalities of gait and mobility (R26.89);Muscle weakness (generalized) (M62.81)                Time: 5621-3086 OT Time Calculation (min): 20 min Charges:  OT General  Charges $OT Visit: 1 Visit OT Evaluation $OT Eval Low Complexity: 1 Low  Tamecca Artiga OT, MOT  Danie [name] 08/22/2023, 9:22 AM

## 2023-08-22 NOTE — Plan of Care (Signed)
  Problem: Education: Goal: Knowledge of General Education information will improve Description: Including pain rating scale, medication(s)/side effects and non-pharmacologic comfort measures Outcome: Not Progressing   Problem: Health Behavior/Discharge Planning: Goal: Ability to manage health-related needs will improve Outcome: Not Progressing   Problem: Clinical Measurements: Goal: Respiratory complications will improve Outcome: Not Progressing   Problem: Activity: Goal: Risk for activity intolerance will decrease Outcome: Not Progressing   Problem: Coping: Goal: Level of anxiety will decrease Outcome: Not Progressing   Problem: Pain Management: Goal: General experience of comfort will improve Outcome: Not Progressing   Problem: Skin Integrity: Goal: Risk for impaired skin integrity will decrease Outcome: Not Progressing   Problem: Coping: Goal: Ability to adjust to condition or change in health will improve Outcome: Not Progressing

## 2023-08-22 NOTE — TOC Progression Note (Signed)
Transition of Care [facility]) - Progression Note    Patient Details  Name: [name] MRN: [id number] Date of Birth: [birth year]  Transition of [facility]) CM/SW Contact  Karn Cassis, [place] Phone Number: 08/22/2023, 3:35 PM  Clinical Narrative: LCSW spoke with Rae Halsted at [facility]. Per Rae Halsted, hospice can admit tomorrow morning at home. MD and palliative updated.       Expected Discharge Plan: Home w Hospice Care Barriers to Discharge: Continued Medical Work up  Expected Discharge Plan and Services In-house Referral: Clinical Social Work   Post Acute Care Choice: Durable Medical Equipment Living arrangements for the past 2 months: Single Family Home                                       Social Determinants of Health (SDOH) Interventions SDOH Screenings   Food Insecurity: No Food Insecurity (08/20/2023)  Housing: Patient Declined (08/20/2023)  Transportation Needs: No Transportation Needs (08/20/2023)  Utilities: Not At Risk (08/20/2023)  Alcohol Screen: Low Risk  (02/13/2022)  Depression (PHQ2-9): Low Risk  (07/20/2023)  Financial Resource Strain: Low Risk  (02/28/2022)  Physical Activity: Inactive (02/28/2022)  Social Connections: Moderately Integrated (06/22/2022)  Stress: No Stress Concern Present (02/28/2022)  Tobacco Use: Medium Risk (08/21/2023)    Readmission Risk Interventions    08/21/2023   10:26 AM 01/14/2023   12:20 PM 12/25/2022    9:36 AM  Readmission Risk Prevention Plan  Transportation Screening Complete Complete Complete  Medication Review Ocean[employer]) Complete Complete Complete  HRI or Home Care Consult Complete Complete Complete  SW Recovery Care/Counseling Consult Complete Complete Complete  Palliative Care Screening Not Applicable Not Applicable Not Applicable  Skilled Nursing Facility Not Applicable Not Applicable Not Applicable

## 2023-08-22 NOTE — Plan of Care (Signed)
  Problem: Acute Rehab OT Goals (only OT should resolve) Goal: Pt. Will Perform Grooming Flowsheets (Taken 08/22/2023 0924) Pt Will Perform Grooming: with modified independence Goal: Pt. Will Transfer To Toilet Flowsheets (Taken 08/22/2023 [id number]) Pt Will Transfer to Toilet:  with modified independence  ambulating Goal: Pt/Caregiver Will Perform Home Exercise Program Flowsheets (Taken 08/22/2023 [phone]) Pt/caregiver will Perform Home Exercise Program:  Increased ROM  Increased strength  Independently  Both right and left upper extremity  [name] OT, MOT

## 2023-08-22 NOTE — Progress Notes (Signed)
PHARMACY - ANTICOAGULATION CONSULT NOTE  Pharmacy Consult for warfarin Indication:  mechanical valve  Allergies  Allergen Reactions   Amlodipine Swelling   Amoxicillin-Pot Clavulanate Diarrhea, Nausea And Vomiting and Other (See Comments)   Carvedilol Other (See Comments)    Terrible cramping in the feet and had a lot of bowel movements, but not diarrhea   Chlorthalidone Other (See Comments)   Ciprofloxacin Diarrhea, Nausea Only and Other (See Comments)    Stomach ache   Codeine Other (See Comments)    Insomnia, anxiety   Escitalopram Other (See Comments)    Other Reaction(s): made head feel funny   Ezetimibe Other (See Comments)    Reaction not recalled   Fluconazole Diarrhea and Other (See Comments)   Immune Globulin (Human) Other (See Comments)    Severe/excruciating pain   Levofloxacin In D5w     Vomiting, diarrhea, insomnia   Losartan Other (See Comments) and Swelling    Patient doesn't recall site of swelling   Lovastatin     Other Reaction(s): do not remember   Niacin     Other Reaction(s): made her feel hot all over   Sertraline     Other Reaction(s): made head feel funny   Sulfamethoxazole-Trimethoprim Diarrhea, Nausea Only and Other (See Comments)    Stomach upset   Verapamil Hives and Other (See Comments)   Zocor [Simvastatin - High Dose] Other (See Comments)    Reaction not recalled   Gatifloxacin Other (See Comments) and Rash    Redness to skin around eye   Lovenox [Enoxaparin] Swelling and Rash    Swelling of legs and rash all over   Simvastatin Other (See Comments) and Rash    Patient Measurements: Height: 4\' 8"  (142.2 cm) Weight: 53.6 kg (118 lb 2.7 oz) IBW/kg (Calculated) : 36.3  Vital Signs: Temp: 97.5 F (36.4 C) (11/13 0536) Temp Source: Oral (11/13 0536) BP: 132/48 (11/13 0536) Pulse Rate: 62 (11/13 0536)  Labs: Recent Labs    08/20/23 1212 08/20/23 1412 08/21/23 0512 08/22/23 0528  HGB 8.4*  --  7.3* 7.0*  HCT 26.1*  --  24.0*  22.6*  PLT 136*  --  126* 108*  APTT  --   --  50*  --   LABPROT 36.9*  --  36.8* 27.8*  INR 3.7*  --  3.7* 2.6*  CREATININE 4.92*  --  5.27* 5.30*  TROPONINIHS 24* 25*  --   --     Estimated Creatinine Clearance: 5.4 mL/min (A) (by C-G formula based on SCr of 5.3 mg/dL (H)).   Medical History: Past Medical History:  Diagnosis Date   Aortic atherosclerosis (HCC) 01/23/2018   Atrial fibrillation (HCC)    Benign positional vertigo 10/06/2011   Carotid artery disease (HCC)    Chemotherapy induced neutropenia (HCC) 07/21/2019   Cholelithiasis 01/23/2018   Cholelithiasis 01/23/2018   Chronic anticoagulation    Chronic kidney failure, stage 4 (severe) (HCC)    Colovesical fistula, ruled out 04/14/2022   Coronary artery disease    status post coronary artery bypass grafting times 07/10/2004   Diabetes mellitus    Hypercholesteremia    Hypertension    Mechanical heart valve present    H. aortic valve replacement at the time of bypass surgery October 2005   Moderate to severe pulmonary hypertension (HCC)    Peripheral arterial disease (HCC)    history of left common iliac artery PTA and stenting for a chronic total occlusion 08/26/01   S/P cholecystectomy 03/06/2018  Medications:  Medications Prior to Admission  Medication Sig Dispense Refill Last Dose   acetaminophen (TYLENOL) 500 MG tablet Take 500-1,000 mg by mouth every 6 (six) hours as needed for mild pain (pain score 1-3) or fever.   08/19/2023   allopurinol (ZYLOPRIM) 100 MG tablet Take 100 mg by mouth daily.   08/20/2023   ALPRAZolam (XANAX) 0.5 MG tablet Take 0.5 mg by mouth at bedtime.   08/19/2023   amLODipine (NORVASC) 10 MG tablet Take 1 tablet (10 mg total) by mouth daily. (Patient taking differently: Take 10 mg by mouth at bedtime.) 90 tablet 3 08/19/2023   aspirin EC 81 MG tablet Take 81 mg by mouth daily. Swallow whole.   08/19/2023   atorvastatin (LIPITOR) 80 MG tablet Take 1 tablet (80 mg total) by mouth  daily at 6 PM.   08/19/2023   budesonide (PULMICORT) 0.5 MG/2ML nebulizer solution Take 2 mLs (0.5 mg total) by nebulization 2 (two) times daily. 120 mL 1 Past Week   Cholecalciferol (VITAMIN D3) 50 MCG (2000 UT) capsule Take 2,000 Units by mouth daily with lunch.   08/19/2023   cloNIDine (CATAPRES) 0.3 MG tablet Take 0.3 mg by mouth 2 (two) times daily.   08/20/2023   Cyanocobalamin (VITAMIN B12) 1000 MCG TBCR Take 1,000 mcg by mouth daily with lunch.   08/20/2023   famotidine (PEPCID) 20 MG tablet Take 20 mg by mouth 2 (two) times daily.   08/20/2023   ferrous sulfate 325 (65 FE) MG tablet Take 1 tablet (325 mg total) by mouth daily with breakfast. (Patient taking differently: Take 650 mg by mouth daily. Lunch time.)  3 08/19/2023   hydrALAZINE (APRESOLINE) 100 MG tablet Take 1 tablet (100 mg total) by mouth 3 (three) times daily. 270 tablet 3 08/20/2023   insulin aspart (NOVOLOG) 100 UNIT/ML injection Inject 2-8 Units into the skin 3 (three) times daily with meals. Per sliding scale, If blood sugar is 200 to 250, give 2 units. If blood sugar is 251 to 300, give 4 units. If blood sugar is 301 to 350, give 6 units. If blood sugar is greater than 350, give 8 units and call MD.   Past Week   ipratropium-albuterol (DUONEB) 0.5-2.5 (3) MG/3ML SOLN Take 3 mLs by nebulization every 6 (six) hours as needed. 360 mL 0 Past Week   ondansetron (ZOFRAN-ODT) 4 MG disintegrating tablet Take 4 mg by mouth every 8 (eight) hours as needed for nausea or vomiting.   08/19/2023   pantoprazole (PROTONIX) 40 MG tablet Take 1 tablet (40 mg total) by mouth 2 (two) times daily. 60 tablet 2 08/20/2023   senna-docusate (SENOKOT-S) 8.6-50 MG tablet Take 2 tablets by mouth at bedtime. (Patient taking differently: Take 2 tablets by mouth at bedtime as needed for mild constipation.) 60 tablet 2 08/19/2023   sertraline (ZOLOFT) 25 MG tablet Take 25 mg by mouth at bedtime.   08/19/2023   simethicone (GAS-X) 80 MG chewable tablet  Chew 1 tablet (80 mg total) by mouth 4 (four) times daily as needed for flatulence. 100 tablet 2 Past Week   sodium bicarbonate 650 MG tablet Take 1 tablet (650 mg total) by mouth 2 (two) times daily. 60 tablet 0 08/20/2023   spironolactone (ALDACTONE) 25 MG tablet TAKE 1 TABLET EVERY DAY (Patient taking differently: Take 25 mg by mouth at bedtime.) 90 tablet 3 08/19/2023   tadalafil, PAH, (ADCIRCA) 20 MG tablet TAKE 2 TABLETS BY MOUTH ONCE DAILY (Patient taking differently: Take 40 mg by  mouth at bedtime.) 60 tablet 11 08/19/2023   warfarin (COUMADIN) 5 MG tablet Take 1 tablet (5 mg total) by mouth See admin instructions. Take 1 tablet 5mg  on Tuesday/Thursday/Sunday and 1/2 Tab (2.5 mg) on MWFSat (Patient taking differently: Take 5 mg by mouth See admin instructions. Take 1 tablet every day unitl visti at Coumadin Clinic Visit on Wednesday)   08/19/2023 at 1700   enoxaparin (LOVENOX) 60 MG/0.6ML injection Inject 0.5 mLs (50 mg total) into the skin daily for 7 days. (Patient not taking: Reported on 08/20/2023) 3.5 mL 0 Not Taking   Insulin Syringe-Needle U-100 (B-D INS SYR ULTRAFINE .3CC/29G) 29G X 1/2" 0.3 ML MISC Use for sliding scale for breakthrough high blood sugar readings 100 each 1    nitrofurantoin (MACRODANTIN) 100 MG capsule Take 100 mg by mouth 2 (two) times daily. (Patient not taking: Reported on 08/20/2023)   Not Taking    Assessment: 83 y.o. female with medical history significant for multiple medical condition including emphysematous cystitis, CKD 4, diastolic CHF, AIHA, mechanical aortic valve, hypertension and pulmonary hypertension, atrial fibrillation, GI bleed, peripheral artery disease, diabetes mellitus. She patient presented to the ED with complaints of weakness, not eating well. .  Patient chronically anticoagulated with warfarin.   INR 3.7> 2.6 hgb 7.0- monitor  PTA dose. Patient said taking 5mg  daily  Goal of Therapy:  INR 2.5-3.5  Monitor platelets by anticoagulation  protocol: Yes   Plan:  Warfarin 5 mg x 1 dose. Daily PT-INR Monitor for S/s of bleeding  Judeth Cornfield, PharmD Clinical Pharmacist 08/22/2023 7:56 AM

## 2023-08-22 NOTE — Progress Notes (Signed)
Progress Note  Patient Name: [name] Date of Encounter: 08/22/2023  Primary Cardiologist: Nanetta Batty, MD  Interval Summary   Sitting in chair this morning, ate breakfast.  Reports did not sleep well last night, having lower abdominal and rectal pain with need to use the bathroom.  This has improved this morning.  No shortness of breath or chest pain at rest.  Vital Signs    Vitals:   08/21/23 2338 08/22/23 0500 08/22/23 0536 08/22/23 0710  BP: (!) 141/62  (!) 132/48   Pulse: 66  62   Resp: 18  18   Temp: 98.1 F (36.7 C)  (!) 97.5 F (36.4 C)   TempSrc: Oral  Oral   SpO2: 95%  98% 96%  Weight:  53.6 kg    Height:        Intake/Output Summary (Last 24 hours) at 08/22/2023 0918 Last data filed at 08/22/2023 0500 Gross per 24 hour  Intake 701 ml  Output 700 ml  Net 1 ml   Filed Weights   08/21/23 0500 08/21/23 1906 08/22/23 0500  Weight: 54.9 kg 54.8 kg 53.6 kg    Physical Exam   GEN: No acute distress.   Neck: No JVD. Cardiac: Irregularly irregular with mechanical click in S2, 2/6 systolic murmur without gallop.  Respiratory: Nonlabored.  Decreased breath sounds at the bases. GI: Soft, nontender, bowel sounds present. MS: No pitting edema. Neuro:  Nonfocal. Psych: Alert and oriented x 3. Normal affect.  ECG/Telemetry    Atrial fibrillation with heart rate in the 80s to 90s.  Labs    Chemistry Recent Labs  Lab 08/20/23 1212 08/21/23 0512 08/22/23 0528  NA 129* 134* 132*  K 5.4* 5.4* 4.9  CL 101 102 105  CO2 18* 20* 19*  GLUCOSE 176* 101* 102*  BUN 88* 90* 91*  CREATININE 4.92* 5.27* 5.30*  CALCIUM 8.3* 9.1 8.3*  PROT 5.5* 6.0* 5.7*  ALBUMIN 2.9* 4.0 4.3  AST 22 20 24   ALT 13 10 12   ALKPHOS 108 85 80  BILITOT 0.5 0.8 1.1  GFRNONAA 8* 8* 8*  ANIONGAP 10 12 8     Hematology Recent Labs  Lab 08/20/23 1212 08/21/23 0512 08/22/23 0528  WBC 6.0 6.1 7.3  RBC 2.59* 2.37* 2.21*  HGB 8.4* 7.3* 7.0*  HCT 26.1* 24.0* 22.6*  MCV  100.8* 101.3* 102.3*  MCH 32.4 30.8 31.7  MCHC 32.2 30.4 31.0  RDW 17.2* 16.8* 16.7*  PLT 136* 126* 108*   Cardiac Enzymes Recent Labs  Lab 08/20/23 1212 08/20/23 1412  TROPONINIHS 24* 25*   Lipid Panel     Component Value Date/Time   CHOL 113 10/26/2022 1239   TRIG 193 (H) 10/26/2022 1239   HDL 25 (L) 10/26/2022 1239   CHOLHDL 4.5 10/26/2022 1239   VLDL 39 10/26/2022 1239   LDLCALC 49 10/26/2022 1239    Cardiac Studies   Echocardiogram 08/21/2023:  1. Left ventricular ejection fraction, by estimation, is 65 to 70%. The  left ventricle has normal function. The left ventricle has no regional  wall motion abnormalities. There is moderate concentric left ventricular  hypertrophy. Left ventricular  diastolic parameters are consistent with Grade II diastolic dysfunction  (pseudonormalization). Elevated left atrial pressure.   2. Right ventricular systolic function is low normal. The right  ventricular size is mildly enlarged. There is mildly elevated pulmonary  artery systolic pressure.   3. Left atrial size was severely dilated.   4. Right atrial size was severely dilated.  5. The mitral valve is abnormal. Mild mitral valve regurgitation. Mild  mitral stenosis. The mean mitral valve gradient is 3.0 mmHg. Heart rate is  65 bpm. Moderate mitral annular calcification.   6. The tricuspid valve is abnormal.   7. St. Jude mechanical valve present in the aortic position. Stable mild  mean gradient across valve of 22 mmHg.      . The aortic valve has been repaired/replaced. Aortic valve  regurgitation is not visualized. Aortic valve mean gradient measures 22.0  mmHg.   8. The inferior vena cava is dilated in size with <50% respiratory  variability, suggesting right atrial pressure of 15 mmHg.   Assessment & Plan   1.  Acute on chronic HFpEF with history of RV dysfunction and pulmonary hypertension.  Follows with Dr. Shirlee Latch in the heart failure clinic, I reviewed the notes.   Possibly WHO group I/II and also complicated by chronically increased AV gradient with mechanical AVR.  Follow-up echocardiogram yesterday shows vigorous LVEF at 65 to 70% with grade 2 diastolic dysfunction and increased left atrial pressure.  RV contraction low normal and RVSP mildly elevated at 41 mmHg.  Peripheral edema improved compared to presentation.  2.  AKI in the setting of CKD stage IV, current creatinine 5.3 with GFR 8.  Nephrology following and managing diuretics at present.  Currently on Lasix 80 mg IV twice daily.  3.  Persistent atrial fibrillation, on Coumadin in the setting of mechanical AVR.  INR 2.6, followed by pharmacy.  Would keep her off Toprol-XL for now and follow heart rate.  4.  St. Jude mechanical AVR in place with chronically elevated mean AV gradient, most recently 22 mmHg.  She is on Coumadin.  5.  P-ANCA vasculitis and chronic multifactorial anemia, eosinophilic granulomatosis with polyangiitis.  Followed by hematology/oncology.  Chart and records reviewed.  Peripheral edema better by patient account, not pitting today.  Her follow-up echocardiogram shows vigorous LVEF with moderate diastolic dysfunction and mildly elevated RV pressures in the setting of low normal RV contraction.  Doubt cardiorenal syndrome based on this, would adjust diuretics per the direction of nephrology.  She is not a candidate for hemodialysis.  Hold off on resuming Toprol-XL for now and follow heart rate in atrial fibrillation.  Continue Coumadin per pharmacy.  She is also on tadalafil, Lokelma, hydralazine, clonidine, and Norvasc.  Given complex situation and limited treatment options, may want to consider palliative care consultation eventually.  For questions or updates, please contact [facility] Please consult [email or web address] for contact info under   Signed, Nona Dell, MD  08/22/2023, 9:18 AM

## 2023-08-22 NOTE — Progress Notes (Signed)
Palliative: [name] is lying quite early in bed.  She appears acutely/chronically ill and quite frail.  She is alert and oriented, able to make her needs known.  Her son, [name], is present at bedside.  We briefly talk about [name] acute health concerns and the plan for discharging home with hospice care through [facility].  She is already active with palliative services, and we will increase care to hospice.  We talked about equipment needs.  I greatly encouraged [name] and can need to lean on hospice for any needs.  They state understanding.  We talk about discharge plan.  [name] asks if his mother feels that she could get into the house without burden.  We talked about the availability of EMS transport home if needed.  At this point they prefer home via private vehicle.  Conference with attending, bedside nursing staff, transition of care team related to patient condition, needs, goals of care, disposition.  Plan: At this point continue to treat the treatable but no CPR or intubation.  Home with "treat the treatable" hospice care with [facility] (active with palliative care).  35 minutes [name] Carmel, NP Palliative medicine team Team phone [phone]

## 2023-08-22 NOTE — Progress Notes (Signed)
Patient ID: [name], female   DOB: 15-Dec-1940, 82 y.o.   MRN: [id number] S: Didn't sleep well last night.  Edema has improved although not complete UOP documented overnight. O:BP (!) 132/48 (BP Location: Right Arm)   Pulse 62   Temp (!) 97.5 F (36.4 C) (Oral)   Resp 18   Ht 4\' 8"  (1.422 m)   Wt 53.6 kg   SpO2 96%   BMI 26.49 kg/m   Intake/Output Summary (Last 24 hours) at 08/22/2023 0942 Last data filed at 08/22/2023 0500 Gross per 24 hour  Intake 701 ml  Output 700 ml  Net 1 ml   Intake/Output: I/O last 3 completed shifts: In: 991 [P.O.:400; IV Piggyback:591] Out: 1200 [Urine:1200]  Intake/Output this shift:  No intake/output data recorded. Weight change: 4.904 kg Gen: Frail, NAD CVS: IRR +metallic click, no rub Resp: decreased BS at bases YQM:[id number], BS, soft, NT Ext: minimal ankle edema bilaterally  Recent Labs  Lab 08/16/23 1107 08/20/23 1212 08/21/23 0512 08/22/23 0528  NA 135 129* 134* 132*  K 4.9 5.4* 5.4* 4.9  CL 105 101 102 105  CO2 19* 18* 20* 19*  GLUCOSE 187* 176* 101* 102*  BUN 68* 88* 90* 91*  CREATININE 3.22* 4.92* 5.27* 5.30*  ALBUMIN  --  2.9* 4.0 4.3  CALCIUM 8.2* 8.3* 9.1 8.3*  PHOS  --  4.7*  --   --   AST  --  22 20 24   ALT  --  13 10 12    Liver Function Tests: Recent Labs  Lab 08/20/23 1212 08/21/23 0512 08/22/23 0528  AST 22 20 24   ALT 13 10 12   ALKPHOS 108 85 80  BILITOT 0.5 0.8 1.1  PROT 5.5* 6.0* 5.7*  ALBUMIN 2.9* 4.0 4.3   Recent Labs  Lab 08/20/23 1212  LIPASE 39   No results for input(s): "AMMONIA" in the last 168 hours. CBC: Recent Labs  Lab 08/20/23 1212 08/21/23 0512 08/22/23 0528  WBC 6.0 6.1 7.3  NEUTROABS 4.2  --   --   HGB 8.4* 7.3* 7.0*  HCT 26.1* 24.0* 22.6*  MCV 100.8* 101.3* 102.3*  PLT 136* 126* 108*   Cardiac Enzymes: No results for input(s): "CKTOTAL", "CKMB", "CKMBINDEX", "TROPONINI" in the last 168 hours. CBG: Recent Labs  Lab 08/21/23 0739 08/21/23 1138 08/21/23 1606  08/21/23 2051 08/22/23 0741  GLUCAP 107* 118* 112* 134* 95    Iron Studies:  Recent Labs    08/22/23 0528  IRON 34  TIBC 208*   Studies/Results: ECHOCARDIOGRAM COMPLETE  Result Date: 08/21/2023    ECHOCARDIOGRAM REPORT   Patient Name:   [name] Date of Exam: 08/21/2023 Medical Rec #:  [id number]      Height:       56.0 in Accession #:    [id number]     Weight:       121.0 lb Date of Birth:  [birth year]      BSA:          1.434 m Patient Age:    82 years       BP:           158/38 mmHg Patient Gender: F              HR:           88 bpm. [facility] Procedure: 2D Echo, Cardiac Doppler and Color Doppler Indications:    Dyspnea R06.00  History:  Patient has prior history of Echocardiogram examinations, most                 recent 02/23/2023. CHF, CAD, Arrythmias:Atrial Fibrillation; Risk                 Factors:Hypertension and Diabetes. H/O mechanical aortic valve                 replacement- [employer] Jude.  Sonographer:    Celesta Gentile RCS Referring Phys: [id number] [name] BRANCH IMPRESSIONS  1. Left ventricular ejection fraction, by estimation, is 65 to 70%. The left ventricle has normal function. The left ventricle has no regional wall motion abnormalities. There is moderate concentric left ventricular hypertrophy. Left ventricular diastolic parameters are consistent with Grade II diastolic dysfunction (pseudonormalization). Elevated left atrial pressure.  2. Right ventricular systolic function is low normal. The right ventricular size is mildly enlarged. There is mildly elevated pulmonary artery systolic pressure.  3. Left atrial size was severely dilated.  4. Right atrial size was severely dilated.  5. The mitral valve is abnormal. Mild mitral valve regurgitation. Mild mitral stenosis. The mean mitral valve gradient is 3.0 mmHg. Heart rate is 65 bpm. Moderate mitral annular calcification.  6. The tricuspid valve is abnormal.  7. St. Jude mechanical valve present in the  aortic position. Stable mild mean gradient across valve of 22 mmHg.     . The aortic valve has been repaired/replaced. Aortic valve regurgitation is not visualized. Aortic valve mean gradient measures 22.0 mmHg.  8. The inferior vena cava is dilated in size with <50% respiratory variability, suggesting right atrial pressure of 15 mmHg. FINDINGS  Left Ventricle: Left ventricular ejection fraction, by estimation, is 65 to 70%. The left ventricle has normal function. The left ventricle has no regional wall motion abnormalities. The left ventricular internal cavity size was normal in size. There is  moderate concentric left ventricular hypertrophy. Left ventricular diastolic parameters are consistent with Grade II diastolic dysfunction (pseudonormalization). Elevated left atrial pressure. Right Ventricle: The right ventricular size is mildly enlarged. Right vetricular wall thickness was not well visualized. Right ventricular systolic function is low normal. There is mildly elevated pulmonary artery systolic pressure. The tricuspid regurgitant velocity is 2.88 m/s, and with an assumed right atrial pressure of 8 mmHg, the estimated right ventricular systolic pressure is 41.2 mmHg. Left Atrium: Left atrial size was severely dilated. Right Atrium: Right atrial size was severely dilated. Pericardium: There is no evidence of pericardial effusion. Mitral Valve: The mitral valve is abnormal. There is moderate thickening of the mitral valve leaflet(s). There is moderate calcification of the mitral valve leaflet(s). Moderate mitral annular calcification. Mild mitral valve regurgitation. Mild mitral valve stenosis. MV peak gradient, 16.2 mmHg. The mean mitral valve gradient is 3.0 mmHg. Tricuspid Valve: The tricuspid valve is abnormal. Tricuspid valve regurgitation is mild . No evidence of tricuspid stenosis. Aortic Valve: St. Jude mechanical valve present in the aortic position. Stable mild mean gradient across valve of 22 mmHg.  The aortic valve has been repaired/replaced. Aortic valve regurgitation is not visualized. Aortic valve mean gradient measures 22.0 mmHg. Aortic valve peak gradient measures 39.9 mmHg. Aortic valve area, by VTI measures 0.99 cm. Pulmonic Valve: The pulmonic valve was not well visualized. Pulmonic valve regurgitation is mild. No evidence of pulmonic stenosis. Aorta: The aortic root is normal in size and structure. Venous: The inferior vena cava is dilated in size with less than 50% respiratory variability, suggesting right atrial pressure of 15  mmHg. IAS/Shunts: No atrial level shunt detected by color flow Doppler.  LEFT VENTRICLE PLAX 2D LVIDd:         5.10 cm   Diastology LVIDs:         2.50 cm   LV e' medial:    4.79 cm/s LV PW:         1.40 cm   LV E/e' medial:  41.3 LV IVS:        1.20 cm   LV e' lateral:   5.77 cm/s LVOT diam:     1.70 cm   LV E/e' lateral: 34.3 LV SV:         76 LV SV Index:   53 LVOT Area:     2.27 cm  RIGHT VENTRICLE RV S prime:     7.40 cm/s TAPSE (M-mode): 1.8 cm LEFT ATRIUM              Index        RIGHT ATRIUM           Index LA diam:        4.60 cm  3.21 cm/m   RA Area:     27.30 cm LA Vol (A2C):   136.0 ml 94.84 ml/m  RA Volume:   90.80 ml  63.32 ml/m LA Vol (A4C):   101.0 ml 70.43 ml/m LA Biplane Vol: 115.0 ml 80.20 ml/m  AORTIC VALVE AV Area (Vmax):    0.96 cm AV Area (Vmean):   1.04 cm AV Area (VTI):     0.99 cm AV Vmax:           316.00 cm/s AV Vmean:          220.000 cm/s AV VTI:            0.769 m AV Peak Grad:      39.9 mmHg AV Mean Grad:      22.0 mmHg LVOT Vmax:         133.00 cm/s LVOT Vmean:        101.000 cm/s LVOT VTI:          0.336 m LVOT/AV VTI ratio: 0.44  AORTA Ao Root diam: 3.00 cm MITRAL VALVE                TRICUSPID VALVE MV Area (PHT): 2.99 cm     TR Peak grad:   33.2 mmHg MV Area VTI:   1.56 cm     TR Vmax:        288.00 cm/s MV Peak grad:  16.2 mmHg MV Mean grad:  3.0 mmHg     SHUNTS MV Vmax:       2.01 m/s     Systemic VTI:  0.34 m MV Vmean:       72.2 cm/s    Systemic Diam: 1.70 cm MV Decel Time: 254 msec MV E velocity: 198.00 cm/s MV A velocity: 55.60 cm/s MV E/A ratio:  3.56 Dina Rich MD Electronically signed by Dina Rich MD Signature Date/Time: 08/21/2023/5:46:36 PM    Final    DG Chest Portable 1 View  Result Date: 08/20/2023 CLINICAL DATA:  Shortness of breath EXAM: PORTABLE CHEST 1 VIEW COMPARISON:  03/26/2023 FINDINGS: Post sternotomy changes. Cardiomegaly with small pleural effusions and diffuse increased interstitial and mild ground-glass opacities suspicious for edema. Calcified lung nodules bilaterally consistent with granuloma. Mild focal airspace disease at the left greater than right lung base. IMPRESSION: Cardiomegaly with small pleural effusions and diffuse increased interstitial and mild  ground-glass opacities suspicious for edema. Mild focal airspace disease at the left greater than right lung base, atelectasis versus pneumonia. Electronically Signed   By: Jasmine Pang M.D.   On: 08/20/2023 17:35    allopurinol  100 mg Oral Daily   ALPRAZolam  0.5 mg Oral QHS   amLODipine  10 mg Oral QHS   atorvastatin  80 mg Oral q1800   budesonide  0.5 mg Nebulization BID   cloNIDine  0.3 mg Oral BID   famotidine  20 mg Oral BID   ferrous sulfate  650 mg Oral Q breakfast   furosemide  80 mg Intravenous Q12H   hydrALAZINE  100 mg Oral TID   insulin aspart  0-9 Units Subcutaneous TID WC   insulin glargine-yfgn  5 Units Subcutaneous QHS   ondansetron (ZOFRAN) IV  4 mg Intravenous Once   pantoprazole  40 mg Oral BID WC   sertraline  25 mg Oral QHS   sodium bicarbonate  650 mg Oral BID   sodium chloride flush  3 mL Intravenous Q12H   sodium chloride flush  3 mL Intravenous Q12H   sodium zirconium cyclosilicate  10 g Oral Daily   tadalafil (PAH)  40 mg Oral QHS   warfarin  5 mg Oral ONCE-1600   Warfarin - Pharmacist Dosing Inpatient   Does not apply q1600    BMET    Component Value Date/Time   NA 132 (L) 08/22/2023  0528   NA 137 12/06/2017 1436   K 4.9 08/22/2023 0528   CL 105 08/22/2023 0528   CO2 19 (L) 08/22/2023 0528   GLUCOSE 102 (H) 08/22/2023 0528   BUN 91 (H) 08/22/2023 0528   BUN 20 12/06/2017 1436   CREATININE 5.30 (H) 08/22/2023 0528   CREATININE 2.95 (H) 08/13/2023 1241   CALCIUM 8.3 (L) 08/22/2023 0528   GFRNONAA 8 (L) 08/22/2023 0528   GFRNONAA 15 (L) 08/13/2023 1241   GFRAA 32 (L) 03/30/2020 0932   CBC    Component Value Date/Time   WBC 7.3 08/22/2023 0528   RBC 2.21 (L) 08/22/2023 0528   HGB 7.0 (L) 08/22/2023 0528   HGB 7.6 (L) 08/13/2023 1241   HCT 22.6 (L) 08/22/2023 0528   HCT 28.5 (L) 11/20/2018 1824   PLT 108 (L) 08/22/2023 0528   PLT 124 (L) 08/13/2023 1241   MCV 102.3 (H) 08/22/2023 0528   MCH 31.7 08/22/2023 0528   MCHC 31.0 08/22/2023 0528   RDW 16.7 (H) 08/22/2023 0528   LYMPHSABS 0.9 08/20/2023 1212   MONOABS 0.6 08/20/2023 1212   EOSABS 0.3 08/20/2023 1212   BASOSABS 0.1 08/20/2023 1212     Assessment/Plan:  AKI/CKD stage IV - presumably due to cardiorenal syndrome in setting of acute on chronic CHF.  Scr continues to climb after IV lasix 80 mg bid yesterday.  Marked improvement of edema and ECHO.  Will hold IV lasix for now and follow.  We discussed the severity and chronicity of her CKD as well as her multiple co-morbidities, specifically about her biventricular failure and pulmonary HTN.  She is not a suitable candidate for dialysis given her advanced age, multiple co-morbidities, and poor functional and nutritional status.  She admits that "my body couldn't tolerate dialysis".  Will continue with conservative measures for now, but will also consult palliative care to help with goals/limits of care.   Avoid nephrotoxic medications including NSAIDs and iodinated intravenous contrast exposure unless the latter is absolutely indicated.   Preferred narcotic agents for pain  control are hydromorphone, fentanyl, and methadone. Morphine should not be used.  Avoid  Baclofen and avoid oral sodium phosphate and magnesium citrate based laxatives / bowel preps.  Continue strict Input and Output monitoring. Will monitor the patient closely with you and intervene or adjust therapy as indicated by changes in clinical status/labs   Acute on chronic CHF with RV dysfunction - Cardiology consulted and agree with repeat ECHO.  Diuresis as above. Hyperkalemia - due to #1.  Continue with Lokelma and IV lasix and follow.  Acute on chronic anemia of CKD stage IV - will check iron stores and transfuse for Hgb <7.  She may need ESA, however in short term would recommend 1 unit PRBC for Hgb of 7 today and follow.  Pulmonary HTN/RV dysfunction - per Cardiology S/p mechanical AVR - on coumadin. PAF - rate controlled.   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   Irena Cords, MD [facility]

## 2023-08-23 ENCOUNTER — Inpatient Hospital Stay: Payer: Medicare HMO | Admitting: Hematology and Oncology

## 2023-08-23 ENCOUNTER — Telehealth: Payer: Self-pay | Admitting: Cardiovascular Disease

## 2023-08-23 ENCOUNTER — Inpatient Hospital Stay: Payer: Medicare HMO

## 2023-08-23 DIAGNOSIS — Z515 Encounter for palliative care: Secondary | ICD-10-CM

## 2023-08-23 DIAGNOSIS — N178 Other acute kidney failure: Secondary | ICD-10-CM | POA: Diagnosis not present

## 2023-08-23 DIAGNOSIS — N184 Chronic kidney disease, stage 4 (severe): Secondary | ICD-10-CM | POA: Diagnosis not present

## 2023-08-23 LAB — MAGNESIUM: Magnesium: 2.5 mg/dL — ABNORMAL HIGH (ref 1.7–2.4)

## 2023-08-23 LAB — CBC
HCT: 23.7 % — ABNORMAL LOW (ref 36.0–46.0)
Hemoglobin: 7.1 g/dL — ABNORMAL LOW (ref 12.0–15.0)
MCH: 30.6 pg (ref 26.0–34.0)
MCHC: 30 g/dL (ref 30.0–36.0)
MCV: 102.2 fL — ABNORMAL HIGH (ref 80.0–100.0)
Platelets: 112 10*3/uL — ABNORMAL LOW (ref 150–400)
RBC: 2.32 MIL/uL — ABNORMAL LOW (ref 3.87–5.11)
RDW: 16.4 % — ABNORMAL HIGH (ref 11.5–15.5)
WBC: 7.6 10*3/uL (ref 4.0–10.5)
nRBC: 0 % (ref 0.0–0.2)

## 2023-08-23 LAB — COMPREHENSIVE METABOLIC PANEL
ALT: 12 U/L (ref 0–44)
AST: 23 U/L (ref 15–41)
Albumin: 3.9 g/dL (ref 3.5–5.0)
Alkaline Phosphatase: 90 U/L (ref 38–126)
Anion gap: 17 — ABNORMAL HIGH (ref 5–15)
BUN: 90 mg/dL — ABNORMAL HIGH (ref 8–23)
CO2: 18 mmol/L — ABNORMAL LOW (ref 22–32)
Calcium: 8.6 mg/dL — ABNORMAL LOW (ref 8.9–10.3)
Chloride: 100 mmol/L (ref 98–111)
Creatinine, Ser: 5.65 mg/dL — ABNORMAL HIGH (ref 0.44–1.00)
GFR, Estimated: 7 mL/min — ABNORMAL LOW (ref >60)
Glucose, Bld: 65 mg/dL — ABNORMAL LOW (ref 70–99)
Potassium: 4.2 mmol/L (ref 3.5–5.1)
Sodium: 135 mmol/L (ref 135–145)
Total Bilirubin: 1.2 mg/dL — ABNORMAL HIGH (ref <1.2)
Total Protein: 5.6 g/dL — ABNORMAL LOW (ref 6.5–8.1)

## 2023-08-23 LAB — GLUCOSE, CAPILLARY
Glucose-Capillary: 70 mg/dL (ref 70–99)
Glucose-Capillary: 108 mg/dL — ABNORMAL HIGH (ref 70–99)

## 2023-08-23 LAB — PROTIME-INR
INR: 2 — ABNORMAL HIGH (ref 0.8–1.2)
Prothrombin Time: 23.1 s — ABNORMAL HIGH (ref 11.4–15.2)

## 2023-08-23 MED ORDER — FUROSEMIDE 40 MG PO TABS: 40.0000 mg | ORAL_TABLET | Freq: Every day | ORAL | 1 refills | Status: DC | PRN

## 2023-08-23 MED ORDER — WARFARIN SODIUM 5 MG PO TABS: 5.0000 mg | ORAL_TABLET | Freq: Once | ORAL | Status: DC

## 2023-08-23 MED ORDER — MORPHINE SULFATE 20 MG/5ML PO SOLN: 2.5000 mg | ORAL | 0 refills | Status: DC | PRN

## 2023-08-23 NOTE — Consult Note (Signed)
[facility] Liaison Consult Note    08/23/2023  ANANIAH BOURDON [birth year] [id number]  Insurance: [employer]  Primary Care Provider: Gaspar Garbe, MD, with [facility], this provider follows   Chart reviewed  for less than 30 days readmission, Palliative consult notes and reveals the patient is currently transitioning to [facility]osp[facility] with Hospice with [facility], "treat the treatable" noted  Plan: Patient will have full care coordination services through Hospice and needs will be met at the hospice level of care. No planned follow up for transitional needs.   Will sign off at transition from hospital.  For questions,   .  Charlesetta Shanks, RN, BSN, CCM [employer], [facility] Liaison Direct Dial: [phone] or secure chat Email: [email or web address]

## 2023-08-23 NOTE — Progress Notes (Signed)
Patient ID: [name], female   DOB: 1941/01/18, 82 y.o.   MRN: [id number]  S: Seen in room.  To go home with hospice today.  Feeling comfortable, eating breakfast.    O:BP (!) 131/54 (BP Location: Right Arm)   Pulse 70   Temp 98.8 F (37.1 C) (Oral)   Resp 16   Ht 4\' 8"  (1.422 m)   Wt 52.4 kg   SpO2 98%   BMI 25.90 kg/m   Intake/Output Summary (Last 24 hours) at 08/23/2023 0916 Last data filed at 08/23/2023 0400 Gross per 24 hour  Intake 240 ml  Output 500 ml  Net -260 ml   Intake/Output: I/O last 3 completed shifts: In: 600 [P.O.:600] Out: 800 [Urine:800]  Intake/Output this shift:  No intake/output data recorded. Weight change: -2.4 kg  Gen: Frail, NAD CVS: IRR +metallic click, no rub Resp: decreased BS at bases [id number], BS, soft, NT Ext: minimal ankle edema bilaterally with increased skin texturing  Recent Labs  Lab 08/16/23 1107 08/20/23 1212 08/21/23 0512 08/22/23 0528 08/23/23 0500  NA 135 129* 134* 132* 135  K 4.9 5.4* 5.4* 4.9 4.2  CL 105 101 102 105 100  CO2 19* 18* 20* 19* 18*  GLUCOSE 187* 176* 101* 102* 65*  BUN 68* 88* 90* 91* 90*  CREATININE 3.22* 4.92* 5.27* 5.30* 5.65*  ALBUMIN  --  2.9* 4.0 4.3 3.9  CALCIUM 8.2* 8.3* 9.1 8.3* 8.6*  PHOS  --  4.7*  --   --   --   AST  --  22 20 24 23   ALT  --  13 10 12 12    Liver Function Tests: Recent Labs  Lab 08/21/23 0512 08/22/23 0528 08/23/23 0500  AST 20 24 23   ALT 10 12 12   ALKPHOS 85 80 90  BILITOT 0.8 1.1 1.2*  PROT 6.0* 5.7* 5.6*  ALBUMIN 4.0 4.3 3.9   Recent Labs  Lab 08/20/23 1212  LIPASE 39   No results for input(s): "AMMONIA" in the last 168 hours. CBC: Recent Labs  Lab 08/20/23 1212 08/21/23 0512 08/22/23 0528 08/23/23 0500  WBC 6.0 6.1 7.3 7.6  NEUTROABS 4.2  --   --   --   HGB 8.4* 7.3* 7.0* 7.1*  HCT 26.1* 24.0* 22.6* 23.7*  MCV 100.8* 101.3* 102.3* 102.2*  PLT 136* 126* 108* 112*   Cardiac Enzymes: No results for input(s): "CKTOTAL", "CKMB",  "CKMBINDEX", "TROPONINI" in the last 168 hours. CBG: Recent Labs  Lab 08/22/23 0741 08/22/23 1128 08/22/23 1624 08/22/23 2108 08/23/23 0726  GLUCAP 95 116* 94 99 70    Iron Studies:  Recent Labs    08/22/23 0528  IRON 34  TIBC 208*   Studies/Results: ECHOCARDIOGRAM COMPLETE  Result Date: 08/21/2023    ECHOCARDIOGRAM REPORT   Patient Name:   [name] Date of Exam: 08/21/2023 Medical Rec #:  [id number]      Height:       56.0 in Accession #:    [id number]     Weight:       121.0 lb Date of Birth:  1941/01/27      BSA:          1.434 m Patient Age:    82 years       BP:           158/38 mmHg Patient Gender: F              HR:  88 bpm. [facility] Procedure: 2D Echo, Cardiac Doppler and Color Doppler Indications:    Dyspnea R06.00  History:        Patient has prior history of Echocardiogram examinations, most                 recent 02/23/2023. CHF, CAD, Arrythmias:Atrial Fibrillation; Risk                 Factors:Hypertension and Diabetes. H/O mechanical aortic valve                 replacement- St. Jude.  Sonographer:    Celesta Gentile RCS Referring Phys: [id number] [name] BRANCH IMPRESSIONS  1. Left ventricular ejection fraction, by estimation, is 65 to 70%. The left ventricle has normal function. The left ventricle has no regional wall motion abnormalities. There is moderate concentric left ventricular hypertrophy. Left ventricular diastolic parameters are consistent with Grade II diastolic dysfunction (pseudonormalization). Elevated left atrial pressure.  2. Right ventricular systolic function is low normal. The right ventricular size is mildly enlarged. There is mildly elevated pulmonary artery systolic pressure.  3. Left atrial size was severely dilated.  4. Right atrial size was severely dilated.  5. The mitral valve is abnormal. Mild mitral valve regurgitation. Mild mitral stenosis. The mean mitral valve gradient is 3.0 mmHg. Heart rate is 65 bpm. Moderate  mitral annular calcification.  6. The tricuspid valve is abnormal.  7. St. Jude mechanical valve present in the aortic position. Stable mild mean gradient across valve of 22 mmHg.     . The aortic valve has been repaired/replaced. Aortic valve regurgitation is not visualized. Aortic valve mean gradient measures 22.0 mmHg.  8. The inferior vena cava is dilated in size with <50% respiratory variability, suggesting right atrial pressure of 15 mmHg. FINDINGS  Left Ventricle: Left ventricular ejection fraction, by estimation, is 65 to 70%. The left ventricle has normal function. The left ventricle has no regional wall motion abnormalities. The left ventricular internal cavity size was normal in size. There is  moderate concentric left ventricular hypertrophy. Left ventricular diastolic parameters are consistent with Grade II diastolic dysfunction (pseudonormalization). Elevated left atrial pressure. Right Ventricle: The right ventricular size is mildly enlarged. Right vetricular wall thickness was not well visualized. Right ventricular systolic function is low normal. There is mildly elevated pulmonary artery systolic pressure. The tricuspid regurgitant velocity is 2.88 m/s, and with an assumed right atrial pressure of 8 mmHg, the estimated right ventricular systolic pressure is 41.2 mmHg. Left Atrium: Left atrial size was severely dilated. Right Atrium: Right atrial size was severely dilated. Pericardium: There is no evidence of pericardial effusion. Mitral Valve: The mitral valve is abnormal. There is moderate thickening of the mitral valve leaflet(s). There is moderate calcification of the mitral valve leaflet(s). Moderate mitral annular calcification. Mild mitral valve regurgitation. Mild mitral valve stenosis. MV peak gradient, 16.2 mmHg. The mean mitral valve gradient is 3.0 mmHg. Tricuspid Valve: The tricuspid valve is abnormal. Tricuspid valve regurgitation is mild . No evidence of tricuspid stenosis. Aortic  Valve: St. Jude mechanical valve present in the aortic position. Stable mild mean gradient across valve of 22 mmHg. The aortic valve has been repaired/replaced. Aortic valve regurgitation is not visualized. Aortic valve mean gradient measures 22.0 mmHg. Aortic valve peak gradient measures 39.9 mmHg. Aortic valve area, by VTI measures 0.99 cm. Pulmonic Valve: The pulmonic valve was not well visualized. Pulmonic valve regurgitation is mild. No evidence of pulmonic stenosis. Aorta:  The aortic root is normal in size and structure. Venous: The inferior vena cava is dilated in size with less than 50% respiratory variability, suggesting right atrial pressure of 15 mmHg. IAS/Shunts: No atrial level shunt detected by color flow Doppler.  LEFT VENTRICLE PLAX 2D LVIDd:         5.10 cm   Diastology LVIDs:         2.50 cm   LV e' medial:    4.79 cm/s LV PW:         1.40 cm   LV E/e' medial:  41.3 LV IVS:        1.20 cm   LV e' lateral:   5.77 cm/s LVOT diam:     1.70 cm   LV E/e' lateral: 34.3 LV SV:         76 LV SV Index:   53 LVOT Area:     2.27 cm  RIGHT VENTRICLE RV S prime:     7.40 cm/s TAPSE (M-mode): 1.8 cm LEFT ATRIUM              Index        RIGHT ATRIUM           Index LA diam:        4.60 cm  3.21 cm/m   RA Area:     27.30 cm LA Vol (A2C):   136.0 ml 94.84 ml/m  RA Volume:   90.80 ml  63.32 ml/m LA Vol (A4C):   101.0 ml 70.43 ml/m LA Biplane Vol: 115.0 ml 80.20 ml/m  AORTIC VALVE AV Area (Vmax):    0.96 cm AV Area (Vmean):   1.04 cm AV Area (VTI):     0.99 cm AV Vmax:           316.00 cm/s AV Vmean:          220.000 cm/s AV VTI:            0.769 m AV Peak Grad:      39.9 mmHg AV Mean Grad:      22.0 mmHg LVOT Vmax:         133.00 cm/s LVOT Vmean:        101.000 cm/s LVOT VTI:          0.336 m LVOT/AV VTI ratio: 0.44  AORTA Ao Root diam: 3.00 cm MITRAL VALVE                TRICUSPID VALVE MV Area (PHT): 2.99 cm     TR Peak grad:   33.2 mmHg MV Area VTI:   1.56 cm     TR Vmax:        288.00 cm/s MV Peak  grad:  16.2 mmHg MV Mean grad:  3.0 mmHg     SHUNTS MV Vmax:       2.01 m/s     Systemic VTI:  0.34 m MV Vmean:      72.2 cm/s    Systemic Diam: 1.70 cm MV Decel Time: 254 msec MV E velocity: 198.00 cm/s MV A velocity: 55.60 cm/s MV E/A ratio:  3.56 Dina Rich MD Electronically signed by Dina Rich MD Signature Date/Time: 08/21/2023/5:46:36 PM    Final     allopurinol  100 mg Oral Daily   ALPRAZolam  0.5 mg Oral QHS   amLODipine  10 mg Oral QHS   atorvastatin  80 mg Oral q1800   budesonide  0.5 mg Nebulization BID   cloNIDine  0.3  mg Oral BID   famotidine  20 mg Oral Daily   ferrous sulfate  650 mg Oral Q breakfast   hydrALAZINE  100 mg Oral TID   insulin aspart  0-9 Units Subcutaneous TID WC   insulin glargine-yfgn  5 Units Subcutaneous QHS   ondansetron (ZOFRAN) IV  4 mg Intravenous Once   pantoprazole  40 mg Oral BID WC   sertraline  25 mg Oral QHS   sodium bicarbonate  650 mg Oral BID   sodium chloride flush  3 mL Intravenous Q12H   sodium chloride flush  3 mL Intravenous Q12H   sodium zirconium cyclosilicate  10 g Oral Daily   tadalafil (PAH)  40 mg Oral QHS   warfarin  5 mg Oral ONCE-1600   Warfarin - Pharmacist Dosing Inpatient   Does not apply q1600    BMET    Component Value Date/Time   NA 135 08/23/2023 0500   NA 137 12/06/2017 1436   K 4.2 08/23/2023 0500   CL 100 08/23/2023 0500   CO2 18 (L) 08/23/2023 0500   GLUCOSE 65 (L) 08/23/2023 0500   BUN 90 (H) 08/23/2023 0500   BUN 20 12/06/2017 1436   CREATININE 5.65 (H) 08/23/2023 0500   CREATININE 2.95 (H) 08/13/2023 1241   CALCIUM 8.6 (L) 08/23/2023 0500   GFRNONAA 7 (L) 08/23/2023 0500   GFRNONAA 15 (L) 08/13/2023 1241   GFRAA 32 (L) 03/30/2020 0932   CBC    Component Value Date/Time   WBC 7.6 08/23/2023 0500   RBC 2.32 (L) 08/23/2023 0500   HGB 7.1 (L) 08/23/2023 0500   HGB 7.6 (L) 08/13/2023 1241   HCT 23.7 (L) 08/23/2023 0500   HCT 28.5 (L) 11/20/2018 1824   PLT 112 (L) 08/23/2023 0500   PLT  124 (L) 08/13/2023 1241   MCV 102.2 (H) 08/23/2023 0500   MCH 30.6 08/23/2023 0500   MCHC 30.0 08/23/2023 0500   RDW 16.4 (H) 08/23/2023 0500   LYMPHSABS 0.9 08/20/2023 1212   MONOABS 0.6 08/20/2023 1212   EOSABS 0.3 08/20/2023 1212   BASOSABS 0.1 08/20/2023 1212     Assessment/Plan:  AKI/CKD stage IV - presumably due to cardiorenal syndrome in setting of acute on chronic CHF.  Scr continues to climb after IV lasix 80 mg bid yesterday.  Marked improvement of edema and ECHO.  Will hold IV lasix for now and follow.  We discussed the severity and chronicity of her CKD as well as her multiple co-morbidities, specifically about her biventricular failure and pulmonary HTN.  She is not a suitable candidate for dialysis given her advanced age, multiple co-morbidities, and poor functional and nutritional status.  She admits that "my body couldn't tolerate dialysis".  Will continue with conservative measures for now, but will also consult palliative care to help with goals/limits of care.   Going home with hospice today Can use Lasix 40 mg prn SOB Nothing else to add- will sign off.  Call with questions.  Acute on chronic CHF with RV dysfunction - Cardiology consulted and agree with repeat ECHO.  Diuresis as above. Hyperkalemia - due to #1.  Continue with Lokelma and IV lasix and follow.  Acute on chronic anemia of CKD stage IV - will check iron stores and transfuse for Hgb <7.  She may need ESA, however in short term would recommend 1 unit PRBC for Hgb of 7 today and follow.  Pulmonary HTN/RV dysfunction - per Cardiology S/p mechanical AVR - on coumadin. PAF -  rate controlled.   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   Bufford Buttner MD [employer]

## 2023-08-23 NOTE — Inpatient Diabetes Management (Signed)
Inpatient Diabetes Program Recommendations  AACE/ADA: New Consensus Statement on Inpatient Glycemic Control (2015)  Target Ranges:  Prepandial:   less than 140 mg/dL      Peak postprandial:   less than 180 mg/dL (1-2 hours)      Critically ill patients:  140 - 180 mg/dL   Lab Results  Component Value Date   GLUCAP 70 08/23/2023   HGBA1C 6.2 (H) 08/05/2023    Review of Glycemic Control  Latest Reference Range & Units 08/22/23 07:41 08/22/23 11:28 08/22/23 16:24 08/22/23 21:08 08/23/23 07:26  Glucose-Capillary 70 - 99 mg/dL 95 517 (H) 94 99 70   Diabetes history: DM 2 Outpatient Diabetes medications: Novolog 2-8 units tid Current orders for Inpatient glycemic control:  Semglee 5 units qhs Novolog 0-9 units tid  Inpatient Diabetes Program Recommendations:    -  Glucose trends on the low side, reduce Semglee to 4 units.  Thanks,  Christena Deem RN, MSN, BC-ADM Inpatient Diabetes Coordinator Team Pager [phone] (8a-5p)

## 2023-08-23 NOTE — Progress Notes (Signed)
Progress Note  Patient Name: [name] Date of Encounter: 08/23/2023  Primary Cardiologist: Nanetta Batty, MD  Interval Summary   Chart reviewed.  Eating breakfast this morning.  Anticipates going home today with hospice.  No shortness of breath at rest.  Vital Signs    Vitals:   08/22/23 2108 08/23/23 0300 08/23/23 0429 08/23/23 0729  BP: (!) 143/64  (!) 131/54   Pulse: (!) 109  70   Resp: 20  16   Temp: 98.8 F (37.1 C)     TempSrc: Oral     SpO2: 95%  99% 98%  Weight:  52.4 kg    Height:        Intake/Output Summary (Last 24 hours) at 08/23/2023 0850 Last data filed at 08/23/2023 0400 Gross per 24 hour  Intake 240 ml  Output 500 ml  Net -260 ml   Filed Weights   08/21/23 1906 08/22/23 0500 08/23/23 0300  Weight: 54.8 kg 53.6 kg 52.4 kg    Physical Exam   GEN: No acute distress.   Neck: No JVD. Cardiac: Irregularly irregular with mechanical click in S2, 2/6 systolic murmur without gallop.  Respiratory: Nonlabored.  Decreased breath sounds at the bases. GI: Soft, nontender, bowel sounds present. MS: No pitting edema.  ECG/Telemetry    Off telemetry.  Labs    Chemistry Recent Labs  Lab 08/21/23 0512 08/22/23 0528 08/23/23 0500  NA 134* 132* 135  K 5.4* 4.9 4.2  CL 102 105 100  CO2 20* 19* 18*  GLUCOSE 101* 102* 65*  BUN 90* 91* 90*  CREATININE 5.27* 5.30* 5.65*  CALCIUM 9.1 8.3* 8.6*  PROT 6.0* 5.7* 5.6*  ALBUMIN 4.0 4.3 3.9  AST 20 24 23   ALT 10 12 12   ALKPHOS 85 80 90  BILITOT 0.8 1.1 1.2*  GFRNONAA 8* 8* 7*  ANIONGAP 12 8 17*    Hematology Recent Labs  Lab 08/21/23 0512 08/22/23 0528 08/23/23 0500  WBC 6.1 7.3 7.6  RBC 2.37* 2.21* 2.32*  HGB 7.3* 7.0* 7.1*  HCT 24.0* 22.6* 23.7*  MCV 101.3* 102.3* 102.2*  MCH 30.8 31.7 30.6  MCHC 30.4 31.0 30.0  RDW 16.8* 16.7* 16.4*  PLT 126* 108* 112*   Cardiac Enzymes Recent Labs  Lab 08/20/23 1212 08/20/23 1412  TROPONINIHS 24* 25*   Lipid Panel     Component Value  Date/Time   CHOL 113 10/26/2022 1239   TRIG 193 (H) 10/26/2022 1239   HDL 25 (L) 10/26/2022 1239   CHOLHDL 4.5 10/26/2022 1239   VLDL 39 10/26/2022 1239   LDLCALC 49 10/26/2022 1239    Cardiac Studies   Echocardiogram 08/21/2023:  1. Left ventricular ejection fraction, by estimation, is 65 to 70%. The  left ventricle has normal function. The left ventricle has no regional  wall motion abnormalities. There is moderate concentric left ventricular  hypertrophy. Left ventricular  diastolic parameters are consistent with Grade II diastolic dysfunction  (pseudonormalization). Elevated left atrial pressure.   2. Right ventricular systolic function is low normal. The right  ventricular size is mildly enlarged. There is mildly elevated pulmonary  artery systolic pressure.   3. Left atrial size was severely dilated.   4. Right atrial size was severely dilated.   5. The mitral valve is abnormal. Mild mitral valve regurgitation. Mild  mitral stenosis. The mean mitral valve gradient is 3.0 mmHg. Heart rate is  65 bpm. Moderate mitral annular calcification.   6. The tricuspid valve is abnormal.  7. St. Jude mechanical valve present in the aortic position. Stable mild  mean gradient across valve of 22 mmHg.      . The aortic valve has been repaired/replaced. Aortic valve  regurgitation is not visualized. Aortic valve mean gradient measures 22.0  mmHg.   8. The inferior vena cava is dilated in size with <50% respiratory  variability, suggesting right atrial pressure of 15 mmHg.   Assessment & Plan   1.  Acute on chronic HFpEF with history of RV dysfunction and pulmonary hypertension.  Possibly WHO group I/II and also complicated by chronically increased AV gradient with mechanical AVR.  Follow-up echocardiogram shows vigorous LVEF at 65 to 70% with grade 2 diastolic dysfunction and increased left atrial pressure.  RV contraction low normal and RVSP mildly elevated at 41 mmHg.  Peripheral edema  improved.  Lasix was held yesterday by nephrology.  2.  AKI in the setting of CKD stage IV, creatinine up to 5.65.  Nephrology following and managing diuretics which are held at present.  She does not want to pursue hemodialysis.  3.  Persistent atrial fibrillation, on Coumadin in the setting of mechanical AVR.  INR 2.6, followed by pharmacy.  Would keep her off Toprol-XL for now.  4.  St. Jude mechanical AVR in place with chronically elevated mean AV gradient, most recently 22 mmHg.  She is on Coumadin.  INR 2.0.  5.  P-ANCA vasculitis and chronic multifactorial anemia, eosinophilic granulomatosis with polyangiitis.  Followed by hematology/oncology.  Interval chart reviewed.  Plan is for discharge home with hospice care.  Continue Coumadin with mechanical AVR and atrial fibrillation.  She is also on tadalafil, Lokelma, hydralazine, clonidine, Lipitor and Norvasc.  Stay off Toprol-XL given recent heart rate.  Can decide on reasonable diuretic dose for discharge per nephrology.  We will sign off.  For questions or updates, please contact [facility] Please consult [email or web address] for contact info under   Signed, Nona Dell, MD  08/23/2023, 8:50 AM

## 2023-08-23 NOTE — Care Management Important Message (Signed)
Important Message  Patient Details  Name: [name] MRN: [id number] Date of Birth: 03-23-1941   Important Message Given:  Yes - Medicare IM     Corey Harold 08/23/2023, 11:24 AM

## 2023-08-23 NOTE — Progress Notes (Signed)
PHARMACY - ANTICOAGULATION CONSULT NOTE  Pharmacy Consult for warfarin Indication:  mechanical valve    Patient Measurements: Height: 4\' 8"  (142.2 cm) Weight: 52.4 kg (115 lb 8.3 oz) IBW/kg (Calculated) : 36.3  Vital Signs: Temp: 98.8 F (37.1 C) (11/13 2108) Temp Source: Oral (11/13 2108) BP: 131/54 (11/14 0429) Pulse Rate: 70 (11/14 0429)  Labs: Recent Labs    08/20/23 1212 08/20/23 1412 08/21/23 0512 08/22/23 0528 08/23/23 0500  HGB 8.4*  --  7.3* 7.0* 7.1*  HCT 26.1*  --  24.0* 22.6* 23.7*  PLT 136*  --  126* 108* 112*  APTT  --   --  50*  --   --   LABPROT 36.9*  --  36.8* 27.8* 23.1*  INR 3.7*  --  3.7* 2.6* 2.0*  CREATININE 4.92*  --  5.27* 5.30* 5.65*  TROPONINIHS 24* 25*  --   --   --     Estimated Creatinine Clearance: 5 mL/min (A) (by C-G formula based on SCr of 5.65 mg/dL (H)).   Medical History: Past Medical History:  Diagnosis Date   Aortic atherosclerosis (HCC) 01/23/2018   Atrial fibrillation (HCC)    Benign positional vertigo 10/06/2011   Carotid artery disease (HCC)    Chemotherapy induced neutropenia (HCC) 07/21/2019   Cholelithiasis 01/23/2018   Cholelithiasis 01/23/2018   Chronic anticoagulation    Chronic kidney failure, stage 4 (severe) (HCC)    Colovesical fistula, ruled out 04/14/2022   Coronary artery disease    status post coronary artery bypass grafting times 07/10/2004   Diabetes mellitus    Hypercholesteremia    Hypertension    Mechanical heart valve present    H. aortic valve replacement at the time of bypass surgery October 2005   Moderate to severe pulmonary hypertension (HCC)    Peripheral arterial disease (HCC)    history of left common iliac artery PTA and stenting for a chronic total occlusion 08/26/01   S/P cholecystectomy 03/06/2018   Assessment: 82 y.o. female with medical history significant for multiple medical condition including emphysematous cystitis, CKD 4, diastolic CHF, AIHA, mechanical aortic valve,  hypertension and pulmonary hypertension, atrial fibrillation, GI bleed, peripheral artery disease, diabetes mellitus. She patient presented to the ED with complaints of weakness, not eating well. Patient chronically anticoagulated with warfarin.   INR 3.7> 2.6>2.0 hgb 7.1- monitor  INR now down below goal at 2.0 this morning. Planning home with hospice today. No bleeding issues noted.   PTA dose. Patient said taking 5mg  daily  Goal of Therapy:  INR 2.5-3.5  Monitor platelets by anticoagulation protocol: Yes   Plan:  Warfarin 5 mg x 1 dose. Plan is to resume home dose at discharge  Sheppard Coil PharmD., BCPS Clinical Pharmacist 08/23/2023 9:07 AM

## 2023-08-23 NOTE — Telephone Encounter (Signed)
Pt c/o medication issue:  1. Name of Medication: enoxaparin (LOVENOX) 60 MG/0.6ML injection (Expired)   2. How are you currently taking this medication (dosage and times per day)?    3. Are you having a reaction (difficulty breathing--STAT)? no  4. What is your medication issue?medication was giving to the patient in the hospital, but daughter states the patient doesn't so well on medication. Was calling to see if patient can be on something else. Please advise

## 2023-08-23 NOTE — Discharge Summary (Signed)
Physician Discharge Summary  [name]:[id number] DOB: 11-23-1940 DOA: 08/20/2023  PCP: Gaspar Garbe, MD  Admit date: 08/20/2023  Discharge date: 08/23/2023  Admitted From:Home  Disposition:  Home with hospice  Recommendations for Outpatient Follow-up:  Follow up with home hospice agency Continue on medications as noted below  Home Health: Home with hospice  Equipment/Devices: Has home equipment  Discharge Condition:Stable  CODE STATUS: DNR  Diet recommendation: Regular  Brief/Interim Summary: [name] is a 82 year old female with extensive history of pulmonary hypertension, A-fib,h/o GI bleed, PAD, HTN, DM2, CKD 4, diastolic CHF, AIHA, emphysematous cystitis, mechanical aortic valve ... Recent hospitalization discharged on 08/09/23 for emphysematous cystitis related Klebsiella.  With ESBL.  Treated with IV gentamicin, also on last admission received 1 unit of PRBC transfusion.   Presented to ED with a chief complaint of not feeling well, poor p.o. intake, generalized weakness. Forted that she was taken off her Lasix by her cardiologist last week.   She was admitted with AKI on CKD stage IV with volume overload and associated acute on chronic diastolic CHF exacerbation for which she was started on IV diuresis at the direction of cardiology as well as nephrology.  She now appears euvolemic and is not a candidate for hemodialysis.  Palliative as discussed with patient and she is agreeable to home hospice which will be arranged by 11/14.  Discharge Diagnoses:  Principal Problem:   Acute renal failure superimposed on stage 4 chronic kidney disease (HCC) Active Problems:   Chronic diastolic CHF (congestive heart failure) (HCC)   H/O mechanical aortic valve replacement   Acute on chronic anemia   Chronic anticoagulation   Anxiety   Diabetes mellitus type 2, controlled (HCC)   Bilateral lower extremity edema   DNR (do not resuscitate)/DNI(Do Not Intubate)    Hyponatremia   Essential hypertension   Hypercholesteremia   Thrombocytopenia (HCC)   Chronic atrial fibrillation (HCC)   Hyperkalemia   PAH (pulmonary artery hypertension) (HCC)   Anemia of chronic disease   Hypoalbuminemia due to protein-calorie malnutrition (HCC)   CAD, multiple vessel  Principal discharge diagnosis: AKI on CKD stage IV with progression to ESRD with associated acute on chronic diastolic CHF exacerbation related to volume overload  Discharge Instructions  Discharge Instructions     Diet - low sodium heart healthy   Complete by: As directed    Increase activity slowly   Complete by: As directed       Allergies as of 08/23/2023       Reactions   Amlodipine Swelling   Amoxicillin-pot Clavulanate Diarrhea, Nausea And Vomiting, Other (See Comments)   Carvedilol Other (See Comments)   Terrible cramping in the feet and had a lot of bowel movements, but not diarrhea   Chlorthalidone Other (See Comments)   Ciprofloxacin Diarrhea, Nausea Only, Other (See Comments)   Stomach ache   Codeine Other (See Comments)   Insomnia, anxiety   Escitalopram Other (See Comments)   Other Reaction(s): made head feel funny   Ezetimibe Other (See Comments)   Reaction not recalled   Fluconazole Diarrhea, Other (See Comments)   Immune Globulin (human) Other (See Comments)   Severe/excruciating pain   Levofloxacin In D5w    Vomiting, diarrhea, insomnia   Losartan Other (See Comments), Swelling   Patient doesn't recall site of swelling   Lovastatin    Other Reaction(s): do not remember   Niacin    Other Reaction(s): made her feel hot all over   Sertraline  Other Reaction(s): made head feel funny   Sulfamethoxazole-trimethoprim Diarrhea, Nausea Only, Other (See Comments)   Stomach upset   Verapamil Hives, Other (See Comments)   Zocor [simvastatin - High Dose] Other (See Comments)   Reaction not recalled   Gatifloxacin Other (See Comments), Rash   Redness to skin around  eye   Lovenox [enoxaparin] Swelling, Rash   Swelling of legs and rash all over   Simvastatin Other (See Comments), Rash        Medication List     STOP taking these medications    spironolactone 25 MG tablet Commonly known as: ALDACTONE       TAKE these medications    acetaminophen 500 MG tablet Commonly known as: TYLENOL Take 500-1,000 mg by mouth every 6 (six) hours as needed for mild pain (pain score 1-3) or fever.   allopurinol 100 MG tablet Commonly known as: ZYLOPRIM Take 100 mg by mouth daily.   ALPRAZolam 0.5 MG tablet Commonly known as: XANAX Take 0.5 mg by mouth at bedtime.   amLODipine 10 MG tablet Commonly known as: NORVASC Take 1 tablet (10 mg total) by mouth daily. What changed: when to take this   aspirin EC 81 MG tablet Take 81 mg by mouth daily. Swallow whole.   atorvastatin 80 MG tablet Commonly known as: LIPITOR Take 1 tablet (80 mg total) by mouth daily at 6 PM.   B-D INS SYR ULTRAFINE .3CC/29G 29G X 1/2" 0.3 ML Misc Generic drug: Insulin Syringe-Needle U-100 Use for sliding scale for breakthrough high blood sugar readings   budesonide 0.5 MG/2ML nebulizer solution Commonly known as: PULMICORT Take 2 mLs (0.5 mg total) by nebulization 2 (two) times daily.   cloNIDine 0.3 MG tablet Commonly known as: CATAPRES Take 0.3 mg by mouth 2 (two) times daily.   enoxaparin 60 MG/0.6ML injection Commonly known as: LOVENOX Inject 0.5 mLs (50 mg total) into the skin daily for 7 days.   famotidine 20 MG tablet Commonly known as: PEPCID Take 20 mg by mouth 2 (two) times daily.   ferrous sulfate 325 (65 FE) MG tablet Take 1 tablet (325 mg total) by mouth daily with breakfast. What changed:  how much to take when to take this additional instructions   furosemide 40 MG tablet Commonly known as: LASIX Take 1 tablet (40 mg total) by mouth daily as needed for fluid or edema. What changed:  how much to take how to take this when to take  this reasons to take this additional instructions   hydrALAZINE 100 MG tablet Commonly known as: APRESOLINE Take 1 tablet (100 mg total) by mouth 3 (three) times daily.   insulin aspart 100 UNIT/ML injection Commonly known as: novoLOG Inject 2-8 Units into the skin 3 (three) times daily with meals. Per sliding scale, If blood sugar is 200 to 250, give 2 units. If blood sugar is 251 to 300, give 4 units. If blood sugar is 301 to 350, give 6 units. If blood sugar is greater than 350, give 8 units and call MD.   ipratropium-albuterol 0.5-2.5 (3) MG/3ML Soln Commonly known as: DUONEB Take 3 mLs by nebulization every 6 (six) hours as needed.   morphine 20 MG/5ML solution Take 0.6 mLs (2.4 mg total) by mouth every 2 (two) hours as needed for pain.   nitrofurantoin 100 MG capsule Commonly known as: MACRODANTIN Take 100 mg by mouth 2 (two) times daily.   ondansetron 4 MG disintegrating tablet Commonly known as: ZOFRAN-ODT Take 4 mg  by mouth every 8 (eight) hours as needed for nausea or vomiting.   pantoprazole 40 MG tablet Commonly known as: PROTONIX Take 1 tablet (40 mg total) by mouth 2 (two) times daily.   senna-docusate 8.6-50 MG tablet Commonly known as: Senokot-S Take 2 tablets by mouth at bedtime. What changed:  when to take this reasons to take this   sertraline 25 MG tablet Commonly known as: ZOLOFT Take 25 mg by mouth at bedtime.   simethicone 80 MG chewable tablet Commonly known as: Gas-X Chew 1 tablet (80 mg total) by mouth 4 (four) times daily as needed for flatulence.   sodium bicarbonate 650 MG tablet Take 1 tablet (650 mg total) by mouth 2 (two) times daily.   tadalafil (PAH) 20 MG tablet Commonly known as: ADCIRCA TAKE 2 TABLETS BY MOUTH ONCE DAILY What changed: when to take this   Vitamin B12 1000 MCG Tbcr Take 1,000 mcg by mouth daily with lunch.   Vitamin D3 50 MCG (2000 UT) capsule Take 2,000 Units by mouth daily with lunch.   warfarin 5 MG  tablet Commonly known as: COUMADIN Take as directed. If you are unsure how to take this medication, talk to your nurse or doctor. Original instructions: Take 1 tablet (5 mg total) by mouth See admin instructions. Take 1 tablet 5mg  on Tuesday/Thursday/Sunday and 1/2 Tab (2.5 mg) on MWFSat What changed: additional instructions        Follow-up Information     [name], [name], MD. Schedule an appointment as soon as possible for a visit in 1 week(s).   Specialty: Internal Medicine Contact information: [address] [place] [address] [phone]                Allergies  Allergen Reactions   Amlodipine Swelling   Amoxicillin-Pot Clavulanate Diarrhea, Nausea And Vomiting and Other (See Comments)   Carvedilol Other (See Comments)    Terrible cramping in the feet and had a lot of bowel movements, but not diarrhea   Chlorthalidone Other (See Comments)   Ciprofloxacin Diarrhea, Nausea Only and Other (See Comments)    Stomach ache   Codeine Other (See Comments)    Insomnia, anxiety   Escitalopram Other (See Comments)    Other Reaction(s): made head feel funny   Ezetimibe Other (See Comments)    Reaction not recalled   Fluconazole Diarrhea and Other (See Comments)   Immune Globulin (Human) Other (See Comments)    Severe/excruciating pain   Levofloxacin In D5w     Vomiting, diarrhea, insomnia   Losartan Other (See Comments) and Swelling    Patient doesn't recall site of swelling   Lovastatin     Other Reaction(s): do not remember   Niacin     Other Reaction(s): made her feel hot all over   Sertraline     Other Reaction(s): made head feel funny   Sulfamethoxazole-Trimethoprim Diarrhea, Nausea Only and Other (See Comments)    Stomach upset   Verapamil Hives and Other (See Comments)   Zocor [Simvastatin - High Dose] Other (See Comments)    Reaction not recalled   Gatifloxacin Other (See Comments) and Rash    Redness to skin around eye   Lovenox [Enoxaparin]  Swelling and Rash    Swelling of legs and rash all over   Simvastatin Other (See Comments) and Rash    Consultations: Nephrology Cardiology   Procedures/Studies: ECHOCARDIOGRAM COMPLETE  Result Date: 08/21/2023    ECHOCARDIOGRAM REPORT   Patient Name:   [facility]  Duaine Dredge Date of Exam: 08/21/2023 Medical Rec #:  [id number]      Height:       56.0 in Accession #:    [id number]     Weight:       121.0 lb Date of Birth:  09/08/1941      BSA:          1.434 m Patient Age:    82 years       BP:           158/38 mmHg Patient Gender: F              HR:           88 bpm. [facility] Procedure: 2D Echo, Cardiac Doppler and Color Doppler Indications:    Dyspnea R06.00  History:        Patient has prior history of Echocardiogram examinations, most                 recent 02/23/2023. CHF, CAD, Arrythmias:Atrial Fibrillation; Risk                 Factors:Hypertension and Diabetes. H/O mechanical aortic valve                 replacement- St. Jude.  Sonographer:    Celesta Gentile RCS Referring Phys: [id number] [name] BRANCH IMPRESSIONS  1. Left ventricular ejection fraction, by estimation, is 65 to 70%. The left ventricle has normal function. The left ventricle has no regional wall motion abnormalities. There is moderate concentric left ventricular hypertrophy. Left ventricular diastolic parameters are consistent with Grade II diastolic dysfunction (pseudonormalization). Elevated left atrial pressure.  2. Right ventricular systolic function is low normal. The right ventricular size is mildly enlarged. There is mildly elevated pulmonary artery systolic pressure.  3. Left atrial size was severely dilated.  4. Right atrial size was severely dilated.  5. The mitral valve is abnormal. Mild mitral valve regurgitation. Mild mitral stenosis. The mean mitral valve gradient is 3.0 mmHg. Heart rate is 65 bpm. Moderate mitral annular calcification.  6. The tricuspid valve is abnormal.  7. St. Jude mechanical valve  present in the aortic position. Stable mild mean gradient across valve of 22 mmHg.     . The aortic valve has been repaired/replaced. Aortic valve regurgitation is not visualized. Aortic valve mean gradient measures 22.0 mmHg.  8. The inferior vena cava is dilated in size with <50% respiratory variability, suggesting right atrial pressure of 15 mmHg. FINDINGS  Left Ventricle: Left ventricular ejection fraction, by estimation, is 65 to 70%. The left ventricle has normal function. The left ventricle has no regional wall motion abnormalities. The left ventricular internal cavity size was normal in size. There is  moderate concentric left ventricular hypertrophy. Left ventricular diastolic parameters are consistent with Grade II diastolic dysfunction (pseudonormalization). Elevated left atrial pressure. Right Ventricle: The right ventricular size is mildly enlarged. Right vetricular wall thickness was not well visualized. Right ventricular systolic function is low normal. There is mildly elevated pulmonary artery systolic pressure. The tricuspid regurgitant velocity is 2.88 m/s, and with an assumed right atrial pressure of 8 mmHg, the estimated right ventricular systolic pressure is 41.2 mmHg. Left Atrium: Left atrial size was severely dilated. Right Atrium: Right atrial size was severely dilated. Pericardium: There is no evidence of pericardial effusion. Mitral Valve: The mitral valve is abnormal. There is moderate thickening of the mitral valve leaflet(s). There is moderate calcification of the mitral valve  leaflet(s). Moderate mitral annular calcification. Mild mitral valve regurgitation. Mild mitral valve stenosis. MV peak gradient, 16.2 mmHg. The mean mitral valve gradient is 3.0 mmHg. Tricuspid Valve: The tricuspid valve is abnormal. Tricuspid valve regurgitation is mild . No evidence of tricuspid stenosis. Aortic Valve: St. Jude mechanical valve present in the aortic position. Stable mild mean gradient across  valve of 22 mmHg. The aortic valve has been repaired/replaced. Aortic valve regurgitation is not visualized. Aortic valve mean gradient measures 22.0 mmHg. Aortic valve peak gradient measures 39.9 mmHg. Aortic valve area, by VTI measures 0.99 cm. Pulmonic Valve: The pulmonic valve was not well visualized. Pulmonic valve regurgitation is mild. No evidence of pulmonic stenosis. Aorta: The aortic root is normal in size and structure. Venous: The inferior vena cava is dilated in size with less than 50% respiratory variability, suggesting right atrial pressure of 15 mmHg. IAS/Shunts: No atrial level shunt detected by color flow Doppler.  LEFT VENTRICLE PLAX 2D LVIDd:         5.10 cm   Diastology LVIDs:         2.50 cm   LV e' medial:    4.79 cm/s LV PW:         1.40 cm   LV E/e' medial:  41.3 LV IVS:        1.20 cm   LV e' lateral:   5.77 cm/s LVOT diam:     1.70 cm   LV E/e' lateral: 34.3 LV SV:         76 LV SV Index:   53 LVOT Area:     2.27 cm  RIGHT VENTRICLE RV S prime:     7.40 cm/s TAPSE (M-mode): 1.8 cm LEFT ATRIUM              Index        RIGHT ATRIUM           Index LA diam:        4.60 cm  3.21 cm/m   RA Area:     27.30 cm LA Vol (A2C):   136.0 ml 94.84 ml/m  RA Volume:   90.80 ml  63.32 ml/m LA Vol (A4C):   101.0 ml 70.43 ml/m LA Biplane Vol: 115.0 ml 80.20 ml/m  AORTIC VALVE AV Area (Vmax):    0.96 cm AV Area (Vmean):   1.04 cm AV Area (VTI):     0.99 cm AV Vmax:           316.00 cm/s AV Vmean:          220.000 cm/s AV VTI:            0.769 m AV Peak Grad:      39.9 mmHg AV Mean Grad:      22.0 mmHg LVOT Vmax:         133.00 cm/s LVOT Vmean:        101.000 cm/s LVOT VTI:          0.336 m LVOT/AV VTI ratio: 0.44  AORTA Ao Root diam: 3.00 cm MITRAL VALVE                TRICUSPID VALVE MV Area (PHT): 2.99 cm     TR Peak grad:   33.2 mmHg MV Area VTI:   1.56 cm     TR Vmax:        288.00 cm/s MV Peak grad:  16.2 mmHg MV Mean grad:  3.0 mmHg     SHUNTS MV  Vmax:       2.01 m/s     Systemic VTI:   0.34 m MV Vmean:      72.2 cm/s    Systemic Diam: 1.70 cm MV Decel Time: 254 msec MV E velocity: 198.00 cm/s MV A velocity: 55.60 cm/s MV E/A ratio:  3.56 Dina Rich MD Electronically signed by Dina Rich MD Signature Date/Time: 08/21/2023/5:46:36 PM    Final    DG Chest Portable 1 View  Result Date: 08/20/2023 CLINICAL DATA:  Shortness of breath EXAM: PORTABLE CHEST 1 VIEW COMPARISON:  03/26/2023 FINDINGS: Post sternotomy changes. Cardiomegaly with small pleural effusions and diffuse increased interstitial and mild ground-glass opacities suspicious for edema. Calcified lung nodules bilaterally consistent with granuloma. Mild focal airspace disease at the left greater than right lung base. IMPRESSION: Cardiomegaly with small pleural effusions and diffuse increased interstitial and mild ground-glass opacities suspicious for edema. Mild focal airspace disease at the left greater than right lung base, atelectasis versus pneumonia. Electronically Signed   By: Jasmine Pang M.D.   On: 08/20/2023 17:35   CT ABDOMEN PELVIS WO CONTRAST  Result Date: 08/05/2023 CLINICAL DATA:  Nausea, vomiting and abdominal pain. EXAM: CT ABDOMEN AND PELVIS WITHOUT CONTRAST TECHNIQUE: Multidetector CT imaging of the abdomen and pelvis was performed following the standard protocol without IV contrast. RADIATION DOSE REDUCTION: This exam was performed according to the departmental dose-optimization program which includes automated exposure control, adjustment of the mA and/or kV according to patient size and/or use of iterative reconstruction technique. COMPARISON:  CT scan 01/13/2023 FINDINGS: Lower chest: Stable mild cardiac enlargement and advanced vascular calcifications. No pericardial effusion. Small hiatal hernia. No acute pulmonary findings. Stable basilar scarring changes and large calcified granuloma in the lingula. Hepatobiliary: No hepatic lesions are identified without contrast. The gallbladder is surgically  absent. No biliary dilatation. Pancreas: Stable moderate pancreatic atrophy but no mass or inflammation. Spleen: Normal size. Scattered calcified splenic granulomas and advanced splenic artery calcifications. Adrenals/Urinary Tract: The adrenal glands are unremarkable. Extensive renal vascular calcifications. No renal mass lesions or hydronephrosis. There is a large amount of gas in the bladder possibly related to recent instrumentation or catheterization. There is also gas in the non dependent wall of the bladder suggesting emphysematous cystitis. Recommend correlation with urinalysis. Stomach/Bowel: Severe sigmoid colon diverticulosis but no findings for acute diverticulitis. No findings for bowel obstruction or free air. Duodenal diverticuli noted. Vascular/Lymphatic: Severe vascular disease appears stable. No aneurysm. No adenopathy. Reproductive: Surgically absent. Other: Pelvic floor relaxation findings with rectovaginocystocele noted. Musculoskeletal: Stable degenerative changes involving the spine and hips. Remote L4 compression fracture. IMPRESSION: 1. Large amount of gas in the bladder possibly related to recent instrumentation or catheterization. There is also gas in the non dependent wall of the bladder suggesting emphysematous cystitis. Recommend correlation with urinalysis. 2. Severe sigmoid colon diverticulosis but no findings for acute diverticulitis. 3. No findings for bowel obstruction or free air. 4. Pelvic floor relaxation findings with rectovaginocystocele noted. 5. Severe vascular disease. Emphysema (ICD10-J43.9). Electronically Signed   By: Rudie Meyer M.D.   On: 08/05/2023 17:49   VAS [place] ABI WITH/WO TBI  Result Date: 08/01/2023  LOWER EXTREMITY DOPPLER STUDY Patient Name:  JACKELIN KNAPPER  Date of Exam:   08/01/2023 Medical Rec #: [id number]       Accession #:    [id number] Date of Birth: September 18, 1941       Patient Gender: F Patient Age:   35 years Exam Location:  [facility] Procedure:  VAS  [place] ABI WITH/WO TBI Referring Phys: JESSICA MILFORD --------------------------------------------------------------------------------  Indications: Peripheral artery disease. EDEMA High Risk Factors: Hypertension, hyperlipidemia, Diabetes, past history of                    smoking, coronary artery disease. Other Factors: Right leg hurts all the time. Left leg feels numb and tingling                off/on.  Comparison Study: Prior study: 05/19/2022 Right ABI: Non-compressible-Medial                   calcinosis/Right toe-0.87                   Left ABI: Non-compressible-Medial calcinosis/Left toe: 0.80 Performing Technologist: Dominica Severin RVS, RCS  Examination Guidelines: A complete evaluation includes at minimum, Doppler waveform signals and systolic blood pressure reading at the level of bilateral brachial, anterior tibial, and posterior tibial arteries, when vessel segments are accessible. Bilateral testing is considered an integral part of a complete examination. Photoelectric Plethysmograph (PPG) waveforms and toe systolic pressure readings are included as required and additional duplex testing as needed. Limited examinations for reoccurring indications may be performed as noted.  ABI Findings: +---------+------------------+-----+--------+--------+ Right    Rt Pressure (mmHg)IndexWaveformComment  +---------+------------------+-----+--------+--------+ Brachial 194                                     +---------+------------------+-----+--------+--------+ PTA      254               1.29 biphasic         +---------+------------------+-----+--------+--------+ DP       254               1.29 biphasic         +---------+------------------+-----+--------+--------+ Great Toe161               0.82 Normal           +---------+------------------+-----+--------+--------+ +---------+------------------+-----+----------+-------+ Left     Lt Pressure (mmHg)IndexWaveform  Comment  +---------+------------------+-----+----------+-------+ Brachial 197                                      +---------+------------------+-----+----------+-------+ PTA      254               1.29 monophasic        +---------+------------------+-----+----------+-------+ DP       254               1.29 biphasic          +---------+------------------+-----+----------+-------+ Great Toe141               0.72 Abnormal          +---------+------------------+-----+----------+-------+ +-------+-----------+-----------+------------+------------+ ABI/TBIToday's ABIToday's TBIPrevious ABIPrevious TBI +-------+-----------+-----------+------------+------------+ Right  [place]         0.82       [place]          0.87         +-------+-----------+-----------+------------+------------+ Left   [place]         0.72                 0.84         +-------+-----------+-----------+------------+------------+   Summary: Right: Resting right ankle-brachial index indicates noncompressible right lower  extremity arteries. The right toe-brachial index is normal. Left: Resting left ankle-brachial index indicates noncompressible left lower extremity arteries. The left toe-brachial index is abnormal. *See table(s) above for measurements and observations.  Suggest follow up study in 12 months. Electronically signed by Nanetta Batty MD on 08/01/2023 at 7:00:21 PM.    Final      Discharge Exam: Vitals:   08/23/23 0429 08/23/23 0729  BP: (!) 131/54   Pulse: 70   Resp: 16   Temp:    SpO2: 99% 98%   Vitals:   08/22/23 2108 08/23/23 0300 08/23/23 0429 08/23/23 0729  BP: (!) 143/64  (!) 131/54   Pulse: (!) 109  70   Resp: 20  16   Temp: 98.8 F (37.1 C)     TempSrc: Oral     SpO2: 95%  99% 98%  Weight:  52.4 kg    Height:        General: Pt is alert, awake, not in acute distress Cardiovascular: RRR, S1/S2 +, no rubs, no gallops Respiratory: CTA bilaterally, no wheezing, no rhonchi Abdominal: Soft, NT,  ND, bowel sounds + Extremities: no edema, no cyanosis    The results of significant diagnostics from this hospitalization (including imaging, microbiology, ancillary and laboratory) are listed below for reference.     Microbiology: Recent Results (from the past 240 hour(s))  Resp panel by RT-PCR (RSV, Flu A&B, Covid) Anterior Nasal Swab     Status: None   Collection Time: 08/20/23 12:00 PM   Specimen: Anterior Nasal Swab  Result Value Ref Range Status   SARS Coronavirus 2 by RT PCR NEGATIVE NEGATIVE Final    Comment: (NOTE) SARS-CoV-2 target nucleic acids are NOT DETECTED.  The SARS-CoV-2 RNA is generally detectable in upper respiratory specimens during the acute phase of infection. The lowest concentration of SARS-CoV-2 viral copies this assay can detect is 138 copies/mL. A negative result does not preclude SARS-Cov-2 infection and should not be used as the sole basis for treatment or other patient management decisions. A negative result may occur with  improper specimen collection/handling, submission of specimen other than nasopharyngeal swab, presence of viral mutation(s) within the areas targeted by this assay, and inadequate number of viral copies(<138 copies/mL). A negative result must be combined with clinical observations, patient history, and epidemiological information. The expected result is Negative.  Fact Sheet for Patients:  BloggerCourse.com  Fact Sheet for Healthcare Providers:  SeriousBroker.it  This test is no t yet approved or cleared by the [place] FDA and  has been authorized for detection and/or diagnosis of SARS-CoV-2 by FDA under an Emergency Use Authorization (EUA). This EUA will remain  in effect (meaning this test can be used) for the duration of the COVID-19 declaration under Section 564(b)(1) of the Act, 21 U.S.C.section 360bbb-3(b)(1), unless the authorization is terminated  or revoked  sooner.       Influenza A by PCR NEGATIVE NEGATIVE Final   Influenza B by PCR NEGATIVE NEGATIVE Final    Comment: (NOTE) The Xpert Xpress SARS-CoV-2/FLU/RSV plus assay is intended as an aid in the diagnosis of influenza from Nasopharyngeal swab specimens and should not be used as a sole basis for treatment. Nasal washings and aspirates are unacceptable for Xpert Xpress SARS-CoV-2/FLU/RSV testing.  Fact Sheet for Patients: BloggerCourse.com  Fact Sheet for Healthcare Providers: SeriousBroker.it  This test is not yet approved or cleared by the [place] FDA and has been authorized for detection and/or diagnosis of SARS-CoV-2 by FDA under an Emergency  Use Authorization (EUA). This EUA will remain in effect (meaning this test can be used) for the duration of the COVID-19 declaration under Section 564(b)(1) of the Act, 21 U.S.C. section 360bbb-3(b)(1), unless the authorization is terminated or revoked.     Resp Syncytial Virus by PCR NEGATIVE NEGATIVE Final    Comment: (NOTE) Fact Sheet for Patients: BloggerCourse.com  Fact Sheet for Healthcare Providers: SeriousBroker.it  This test is not yet approved or cleared by the [place] FDA and has been authorized for detection and/or diagnosis of SARS-CoV-2 by FDA under an Emergency Use Authorization (EUA). This EUA will remain in effect (meaning this test can be used) for the duration of the COVID-19 declaration under Section 564(b)(1) of the Act, 21 U.S.C. section 360bbb-3(b)(1), unless the authorization is terminated or revoked.  Performed at [facility], [address], [place], [place] [address]   MRSA Next Gen by PCR, Nasal     Status: None   Collection Time: 08/20/23  3:00 PM   Specimen: Nasal Mucosa; Nasal Swab  Result Value Ref Range Status   MRSA by PCR Next Gen NOT DETECTED NOT DETECTED Final    Comment:  (NOTE) The GeneXpert MRSA Assay (FDA approved for NASAL specimens only), is one component of a comprehensive MRSA colonization surveillance program. It is not intended to diagnose MRSA infection nor to guide or monitor treatment for MRSA infections. Test performance is not FDA approved in patients less than 53 years old. Performed at [facility], [address], [place], [place] [address]      Labs: BNP (last 3 results) Recent Labs    08/27/22 0700 10/26/22 1239 08/20/23 1212  BNP 498.0* 390.1* 687.0*   Basic Metabolic Panel: Recent Labs  Lab 08/16/23 1107 08/20/23 1212 08/21/23 0512 08/22/23 0528 08/23/23 0500  NA 135 129* 134* 132* 135  K 4.9 5.4* 5.4* 4.9 4.2  CL 105 101 102 105 100  CO2 19* 18* 20* 19* 18*  GLUCOSE 187* 176* 101* 102* 65*  BUN 68* 88* 90* 91* 90*  CREATININE 3.22* 4.92* 5.27* 5.30* 5.65*  CALCIUM 8.2* 8.3* 9.1 8.3* 8.6*  MG  --  2.7*  --   --  2.5*  PHOS  --  4.7*  --   --   --    Liver Function Tests: Recent Labs  Lab 08/20/23 1212 08/21/23 0512 08/22/23 0528 08/23/23 0500  AST 22 20 24 23   ALT 13 10 12 12   ALKPHOS 108 85 80 90  BILITOT 0.5 0.8 1.1 1.2*  PROT 5.5* 6.0* 5.7* 5.6*  ALBUMIN 2.9* 4.0 4.3 3.9   Recent Labs  Lab 08/20/23 1212  LIPASE 39   No results for input(s): "AMMONIA" in the last 168 hours. CBC: Recent Labs  Lab 08/20/23 1212 08/21/23 0512 08/22/23 0528 08/23/23 0500  WBC 6.0 6.1 7.3 7.6  NEUTROABS 4.2  --   --   --   HGB 8.4* 7.3* 7.0* 7.1*  HCT 26.1* 24.0* 22.6* 23.7*  MCV 100.8* 101.3* 102.3* 102.2*  PLT 136* 126* 108* 112*   Cardiac Enzymes: No results for input(s): "CKTOTAL", "CKMB", "CKMBINDEX", "TROPONINI" in the last 168 hours. BNP: Invalid input(s): "POCBNP" CBG: Recent Labs  Lab 08/22/23 0741 08/22/23 1128 08/22/23 1624 08/22/23 2108 08/23/23 0726  GLUCAP 95 116* 94 99 70   D-Dimer No results for input(s): "DDIMER" in the last 72 hours. Hgb A1c No results for input(s): "HGBA1C" in  the last 72 hours. Lipid Profile No results for input(s): "CHOL", "HDL", "LDLCALC", "  TRIG", "CHOLHDL", "LDLDIRECT" in the last 72 hours. Thyroid function studies No results for input(s): "TSH", "T4TOTAL", "T3FREE", "THYROIDAB" in the last 72 hours.  Invalid input(s): "FREET3" Anemia work up Recent Labs    08/22/23 0528  TIBC 208*  IRON 34   Urinalysis    Component Value Date/Time   COLORURINE YELLOW 08/20/2023 1330   APPEARANCEUR CLEAR 08/20/2023 1330   LABSPEC 1.008 08/20/2023 1330   PHURINE 6.0 08/20/2023 1330   GLUCOSEU NEGATIVE 08/20/2023 1330   HGBUR NEGATIVE 08/20/2023 1330   BILIRUBINUR NEGATIVE 08/20/2023 1330   BILIRUBINUR Negative 09/07/2021 1054   KETONESUR NEGATIVE 08/20/2023 1330   PROTEINUR 100 (A) 08/20/2023 1330   UROBILINOGEN 0.2 09/07/2021 1054   UROBILINOGEN 1.0 10/06/2011 1709   NITRITE NEGATIVE 08/20/2023 1330   LEUKOCYTESUR NEGATIVE 08/20/2023 1330   Sepsis Labs Recent Labs  Lab 08/20/23 1212 08/21/23 0512 08/22/23 0528 08/23/23 0500  WBC 6.0 6.1 7.3 7.6   Microbiology Recent Results (from the past 240 hour(s))  Resp panel by RT-PCR (RSV, Flu A&B, Covid) Anterior Nasal Swab     Status: None   Collection Time: 08/20/23 12:00 PM   Specimen: Anterior Nasal Swab  Result Value Ref Range Status   SARS Coronavirus 2 by RT PCR NEGATIVE NEGATIVE Final    Comment: (NOTE) SARS-CoV-2 target nucleic acids are NOT DETECTED.  The SARS-CoV-2 RNA is generally detectable in upper respiratory specimens during the acute phase of infection. The lowest concentration of SARS-CoV-2 viral copies this assay can detect is 138 copies/mL. A negative result does not preclude SARS-Cov-2 infection and should not be used as the sole basis for treatment or other patient management decisions. A negative result may occur with  improper specimen collection/handling, submission of specimen other than nasopharyngeal swab, presence of viral mutation(s) within the areas  targeted by this assay, and inadequate number of viral copies(<138 copies/mL). A negative result must be combined with clinical observations, patient history, and epidemiological information. The expected result is Negative.  Fact Sheet for Patients:  BloggerCourse.com  Fact Sheet for Healthcare Providers:  SeriousBroker.it  This test is no t yet approved or cleared by the [place] FDA and  has been authorized for detection and/or diagnosis of SARS-CoV-2 by FDA under an Emergency Use Authorization (EUA). This EUA will remain  in effect (meaning this test can be used) for the duration of the COVID-19 declaration under Section 564(b)(1) of the Act, 21 U.S.C.section 360bbb-3(b)(1), unless the authorization is terminated  or revoked sooner.       Influenza A by PCR NEGATIVE NEGATIVE Final   Influenza B by PCR NEGATIVE NEGATIVE Final    Comment: (NOTE) The Xpert Xpress SARS-CoV-2/FLU/RSV plus assay is intended as an aid in the diagnosis of influenza from Nasopharyngeal swab specimens and should not be used as a sole basis for treatment. Nasal washings and aspirates are unacceptable for Xpert Xpress SARS-CoV-2/FLU/RSV testing.  Fact Sheet for Patients: BloggerCourse.com  Fact Sheet for Healthcare Providers: SeriousBroker.it  This test is not yet approved or cleared by the [place] FDA and has been authorized for detection and/or diagnosis of SARS-CoV-2 by FDA under an Emergency Use Authorization (EUA). This EUA will remain in effect (meaning this test can be used) for the duration of the COVID-19 declaration under Section 564(b)(1) of the Act, 21 U.S.C. section 360bbb-3(b)(1), unless the authorization is terminated or revoked.     Resp Syncytial Virus by PCR NEGATIVE NEGATIVE Final    Comment: (NOTE) Fact Sheet for  Patients: BloggerCourse.com  Fact Sheet for Healthcare Providers: SeriousBroker.it  This test is not yet approved or cleared by the [place] FDA and has been authorized for detection and/or diagnosis of SARS-CoV-2 by FDA under an Emergency Use Authorization (EUA). This EUA will remain in effect (meaning this test can be used) for the duration of the COVID-19 declaration under Section 564(b)(1) of the Act, 21 U.S.C. section 360bbb-3(b)(1), unless the authorization is terminated or revoked.  Performed at [facility], [address], [place], [place] [address]   MRSA Next Gen by PCR, Nasal     Status: None   Collection Time: 08/20/23  3:00 PM   Specimen: Nasal Mucosa; Nasal Swab  Result Value Ref Range Status   MRSA by PCR Next Gen NOT DETECTED NOT DETECTED Final    Comment: (NOTE) The GeneXpert MRSA Assay (FDA approved for NASAL specimens only), is one component of a comprehensive MRSA colonization surveillance program. It is not intended to diagnose MRSA infection nor to guide or monitor treatment for MRSA infections. Test performance is not FDA approved in patients less than 69 years old. Performed at [facility], [address], [place], [place] [address]      Time coordinating discharge: 35 minutes  SIGNED:   Erick Blinks, DO [facility] 08/23/2023, 10:08 AM  If 7PM-7AM, please contact night-coverage [email or web address]

## 2023-08-23 NOTE — Progress Notes (Signed)
Palliative:  Chart review completed.  [name] is to discharge home with her family today with the benefits of "treat the treatable" hospice care through [facility].  No needs at this time.  Plan: Continue to treat the treatable but no CPR or intubation.  Returning home with the benefits of "treat the treatable" hospice care with [facility].  No charge   Lillia Carmel, NP Palliative medicine team Team phone [phone]

## 2023-08-23 NOTE — Telephone Encounter (Signed)
 Attempted to call patient, no answer left message requesting a call back.

## 2023-08-23 NOTE — TOC Transition Note (Signed)
Transition of Care [facility]) - CM/SW Discharge Note   Patient Details  Name: [name] MRN: [id number] Date of Birth: September 24, 1941  Transition of Care [facility]) CM/SW Contact:  Elliot Gault, LCSW Phone Number: 08/23/2023, 11:03 AM   Clinical Narrative:     Pt medically stable for dc per MD. Sherron Monday with pt's daughter at her request to answer questions about the hospice referral. Daughter expressed concern that she did not think pt met hospice criteria at this point.   Updated Rae Halsted at [facility] and asked her to also call daughter to review. Received further update from [facility] that she spoke with dtr and that Hospice RN will plan to admit pt tomorrow at home.   No other TOC needs for dc.  Final next level of care: Home w Hospice Care Barriers to Discharge: Barriers Resolved   Patient Goals and CMS Choice      Discharge Placement                         Discharge Plan and Services Additional resources added to the After Visit Summary for   In-house Referral: Clinical Social Work   Post Acute Care Choice: Durable Medical Equipment                               Social Determinants of Health (SDOH) Interventions SDOH Screenings   Food Insecurity: No Food Insecurity (08/20/2023)  Housing: Patient Declined (08/20/2023)  Transportation Needs: No Transportation Needs (08/20/2023)  Utilities: Not At Risk (08/20/2023)  Alcohol Screen: Low Risk  (02/13/2022)  Depression (PHQ2-9): Low Risk  (07/20/2023)  Financial Resource Strain: Low Risk  (02/28/2022)  Physical Activity: Inactive (02/28/2022)  Social Connections: Moderately Integrated (06/22/2022)  Stress: No Stress Concern Present (02/28/2022)  Tobacco Use: Medium Risk (08/21/2023)     Readmission Risk Interventions    08/21/2023   10:26 AM 01/14/2023   12:20 PM 12/25/2022    9:36 AM  Readmission Risk Prevention Plan  Transportation Screening Complete Complete Complete  Medication Review Ocean[employer])  Complete Complete Complete  HRI or Home Care Consult Complete Complete Complete  SW Recovery Care/Counseling Consult Complete Complete Complete  Palliative Care Screening Not Applicable Not Applicable Not Applicable  Skilled Nursing Facility Not Applicable Not Applicable Not Applicable

## 2023-08-24 ENCOUNTER — Other Ambulatory Visit: Payer: Self-pay | Admitting: *Deleted

## 2023-08-24 DIAGNOSIS — M8580 Other specified disorders of bone density and structure, unspecified site: Secondary | ICD-10-CM | POA: Diagnosis not present

## 2023-08-24 DIAGNOSIS — D591 Autoimmune hemolytic anemia, unspecified: Secondary | ICD-10-CM | POA: Diagnosis present

## 2023-08-24 DIAGNOSIS — D631 Anemia in chronic kidney disease: Secondary | ICD-10-CM | POA: Diagnosis present

## 2023-08-24 DIAGNOSIS — Z7901 Long term (current) use of anticoagulants: Secondary | ICD-10-CM | POA: Diagnosis not present

## 2023-08-24 DIAGNOSIS — D638 Anemia in other chronic diseases classified elsewhere: Secondary | ICD-10-CM

## 2023-08-24 DIAGNOSIS — N184 Chronic kidney disease, stage 4 (severe): Secondary | ICD-10-CM | POA: Diagnosis present

## 2023-08-24 DIAGNOSIS — I7782 Antineutrophilic cytoplasmic antibody (ANCA) vasculitis: Secondary | ICD-10-CM | POA: Diagnosis not present

## 2023-08-24 NOTE — Telephone Encounter (Signed)
Patient identification verified by 2 forms. [name] Rail, RN   Called and spoke to patients daughter [name] states:   -Patient was discharged from hospital yesterday with daily lovenox injections   -concerned about giving injections, after previous 10/31 discharge  patient broke out in a rash from the lovenox injections   -Patient was seen at the Coumadin clinic 11/6 and Lovenox was discontinued   -Patients INR yesterday was 2   -unsure if she needs to give patient Lovenox injection  Informed Brewer Sink message sent to coumadin clinic for assistance  Maxeys Sink verbalized understanding, no questions at this time

## 2023-08-24 NOTE — Telephone Encounter (Signed)
Spoke with daughter. Has not given patient Lovenox due to possible allergic reaction at that time. Patient not doing well and family completed hospice paperwork this afternoon. Currently has an INR appt in [place] on Monday. Advised she can hold Lovenox and continue warfarin as scheduled. Will be happy to give orders to hospice to check INR if needed.

## 2023-08-27 ENCOUNTER — Ambulatory Visit: Payer: Medicare HMO

## 2023-08-27 ENCOUNTER — Other Ambulatory Visit: Payer: Medicare HMO

## 2023-08-27 ENCOUNTER — Ambulatory Visit: Payer: Medicare HMO | Attending: Cardiology | Admitting: *Deleted

## 2023-08-27 ENCOUNTER — Other Ambulatory Visit: Payer: Self-pay | Admitting: *Deleted

## 2023-08-27 DIAGNOSIS — Z952 Presence of prosthetic heart valve: Secondary | ICD-10-CM

## 2023-08-27 DIAGNOSIS — Z5181 Encounter for therapeutic drug level monitoring: Secondary | ICD-10-CM

## 2023-08-27 DIAGNOSIS — D638 Anemia in other chronic diseases classified elsewhere: Secondary | ICD-10-CM

## 2023-08-27 LAB — POCT INR: INR: 3.3 — AB (ref 2.0–3.0)

## 2023-08-27 NOTE — Patient Instructions (Signed)
Hold warfarin tonight then decrease dose to 1 tablet daily except 1/2 tablet on Wednesdays Stay consistent with Boost drinks (1/2 bottle each day).  Recheck INR in 1 week by hospice in the home. If starting any new medications please call.

## 2023-08-28 ENCOUNTER — Inpatient Hospital Stay: Payer: Medicare HMO

## 2023-08-28 ENCOUNTER — Other Ambulatory Visit: Payer: Self-pay | Admitting: Hematology and Oncology

## 2023-08-28 DIAGNOSIS — D591 Autoimmune hemolytic anemia, unspecified: Secondary | ICD-10-CM | POA: Diagnosis not present

## 2023-08-28 DIAGNOSIS — D638 Anemia in other chronic diseases classified elsewhere: Secondary | ICD-10-CM

## 2023-08-28 DIAGNOSIS — D631 Anemia in chronic kidney disease: Secondary | ICD-10-CM | POA: Diagnosis not present

## 2023-08-28 DIAGNOSIS — M8580 Other specified disorders of bone density and structure, unspecified site: Secondary | ICD-10-CM | POA: Diagnosis not present

## 2023-08-28 DIAGNOSIS — I7782 Antineutrophilic cytoplasmic antibody (ANCA) vasculitis: Secondary | ICD-10-CM | POA: Diagnosis not present

## 2023-08-28 DIAGNOSIS — Z7901 Long term (current) use of anticoagulants: Secondary | ICD-10-CM | POA: Diagnosis not present

## 2023-08-28 DIAGNOSIS — N184 Chronic kidney disease, stage 4 (severe): Secondary | ICD-10-CM | POA: Diagnosis not present

## 2023-08-28 LAB — CBC WITH DIFFERENTIAL (CANCER CENTER ONLY)
Abs Immature Granulocytes: 0.02 10*3/uL (ref 0.00–0.07)
Basophils Absolute: 0 10*3/uL (ref 0.0–0.1)
Basophils Relative: 1 %
Eosinophils Absolute: 0.4 10*3/uL (ref 0.0–0.5)
Eosinophils Relative: 7 %
HCT: 24.1 % — ABNORMAL LOW (ref 36.0–46.0)
Hemoglobin: 7.5 g/dL — ABNORMAL LOW (ref 12.0–15.0)
Immature Granulocytes: 0 %
Lymphocytes Relative: 13 %
Lymphs Abs: 0.8 10*3/uL (ref 0.7–4.0)
MCH: 31.1 pg (ref 26.0–34.0)
MCHC: 31.1 g/dL (ref 30.0–36.0)
MCV: 100 fL (ref 80.0–100.0)
Monocytes Absolute: 0.5 10*3/uL (ref 0.1–1.0)
Monocytes Relative: 8 %
Neutro Abs: 4.1 10*3/uL (ref 1.7–7.7)
Neutrophils Relative %: 71 %
Platelet Count: 111 10*3/uL — ABNORMAL LOW (ref 150–400)
RBC: 2.41 MIL/uL — ABNORMAL LOW (ref 3.87–5.11)
RDW: 15.4 % (ref 11.5–15.5)
WBC Count: 5.7 10*3/uL (ref 4.0–10.5)
nRBC: 0 % (ref 0.0–0.2)

## 2023-08-28 LAB — PREPARE RBC (CROSSMATCH)

## 2023-08-28 MED ORDER — ACETAMINOPHEN 325 MG PO TABS
650.0000 mg | ORAL_TABLET | Freq: Once | ORAL | Status: AC
Start: 2023-08-28 — End: 2023-08-28
  Administered 2023-08-28: 650 mg via ORAL
  Filled 2023-08-28: qty 2

## 2023-08-28 MED ORDER — DIPHENHYDRAMINE HCL 25 MG PO CAPS
25.0000 mg | ORAL_CAPSULE | Freq: Once | ORAL | Status: AC
Start: 2023-08-28 — End: 2023-08-28
  Administered 2023-08-28: 25 mg via ORAL
  Filled 2023-08-28: qty 1

## 2023-08-28 MED ORDER — SODIUM CHLORIDE 0.9% IV SOLUTION
250.0000 mL | INTRAVENOUS | Status: DC
  Administered 2023-08-28: 100 mL via INTRAVENOUS

## 2023-08-28 NOTE — Patient Instructions (Signed)
Blood Transfusion, Adult A blood transfusion is a procedure in which you receive blood or a type of blood cell (blood component) through an IV. You may need a blood transfusion when you have a low blood count, which is a low number of any blood cell. This may result from a bleeding disorder, illness, injury, or surgery. The blood may come from a donor, or you may be able to have your own blood collected and stored (autologous blood donation) before a planned surgery. The blood given in a transfusion may be made up of different blood components. You may receive: Red blood cells. These carry oxygen to the cells in the body. Platelets. These help your blood to clot. Plasma. This is the liquid part of your blood. It carries proteins and other substances throughout the body. White blood cells. These help you fight infections. If you have hemophilia or another clotting disorder, you may also receive other types of blood products. Depending on the type of blood product, this procedure may take 1-4 hours to complete. Tell a health care provider about: Any bleeding problems you have. Any previous reactions you have had during a blood transfusion. Any allergies you have. All medicines you are taking, including vitamins, herbs, eye drops, creams, and over-the-counter medicines. Any surgeries you have had. Any medical conditions you have. Whether you are pregnant or may be pregnant. What are the risks? Talk with your health care provider about risks. The most common problems include: A mild allergic reaction, such as red, swollen areas of skin (hives) and itching. Fever or chills. This may be the body's response to new blood cells received. This may occur during or up to 4 hours after the transfusion. More serious problems may include: A serious allergic reaction that causes difficulty breathing or swelling around the face and lips. Transfusion-associated circulatory overload (TACO), or too much fluid in  the lungs. This may cause breathing problems. Transfusion-related acute lung injury (TRALI), which causes breathing difficulty and low oxygen in the blood. This can occur within hours of the transfusion or several days later. Iron overload. This can happen after receiving many blood transfusions over a period of time. Infection or virus being transmitted. This is rare because donated blood is carefully tested before it is given. Hemolytic transfusion reaction. This is rare. It happens when the body's defense system (immune system)tries to attack the new blood cells. Symptoms may include fever, chills, nausea, low blood pressure, and low back or chest pain. Transfusion-associated graft-versus-host disease (TAGVHD). This is rare. It happens when donated cells attack the body's healthy tissues. What happens before the procedure? You will have a blood test to check your blood type. This test is done to know what kind of blood your body will accept and to match it to the donor blood. If you are going to have a planned surgery, you may be able to do an autologous blood donation. This may be done in case you need to have a transfusion. You will have your temperature, blood pressure, and pulse checked before the transfusion. If you have had an allergic reaction to a transfusion in the past, you may be given medicine to help prevent a reaction. This medicine may be given to you by mouth (orally) or through an IV. What happens during the procedure?  An IV will be inserted into one of your veins. The bag of blood will be attached to your IV. The blood will then enter through your vein. Your temperature, blood pressure,   and pulse will be monitored during the transfusion. This monitoring is done to detect early signs of a transfusion reaction. Tell your nurse right away if you have any of these symptoms during the transfusion: Shortness of breath or trouble breathing. Chest or back pain. Fever or  chills. Itching or hives. If you have any signs or symptoms of a reaction, your transfusion will be stopped and you may be given medicine. When the transfusion is complete, your IV will be removed. Pressure may be applied to the IV site for a few minutes. A bandage (dressing)will be applied. The procedure may vary among health care providers and hospitals. What happens after the procedure? Your temperature, blood pressure, pulse, breathing rate, and blood oxygen level will be monitored until you leave the hospital or clinic. Your blood may be tested to see how you have responded to the transfusion. You may be warmed with fluids or blankets to maintain a normal body temperature. If you receive your blood transfusion in an outpatient setting, you will be told whom to contact to report any reactions. Where to find more information Visit the [employer]: redcross.org Summary A blood transfusion is a procedure in which you receive blood or a type of blood cell (blood component) through an IV. The blood given in a transfusion may be made up of different blood components. You may receive red blood cells, platelets, plasma, or white blood cells depending on the condition treated. Your temperature, blood pressure, and pulse will be monitored before, during, and after the transfusion. After the transfusion, your blood may be tested to see how your body has responded. This information is not intended to replace advice given to you by your health care provider. Make sure you discuss any questions you have with your health care provider. Document Revised: 12/23/2021 Document Reviewed: 12/23/2021 Elsevier Patient Education  2024 Elsevier Inc.  

## 2023-08-29 LAB — TYPE AND SCREEN
ABO/RH(D): A POS
Antibody Screen: NEGATIVE
Unit division: 0

## 2023-08-29 LAB — BPAM RBC
Blood Product Expiration Date: 202412132359
ISSUE DATE / TIME: 202411191025
Unit Type and Rh: 6200

## 2023-08-31 ENCOUNTER — Ambulatory Visit: Payer: Self-pay

## 2023-08-31 NOTE — Patient Outreach (Signed)
  Care Coordination   Follow Up Visit Note   08/31/2023 Name: ITZIA CRAYCRAFT MRN: [id number] DOB: 09/23/1941  KONI SAVIN is a 82 y.o. year old female who sees [name], [name], MD for primary care. I reviewed patient's chart today in preparation to contact patient for a nurse care coordination follow up.   What matters to the patients health and wellness today?  Patient not engaged during this call.     Goals Addressed             This Visit's Progress    COMPLETED: To recover from UTI       Care Coordination Interventions: Spoke with Hospital doctor with [facility] to confirm patient is currently under the care of Hospice     Interventions Today    Flowsheet Row Most Recent Value  General Interventions   General Interventions Discussed/Reviewed Communication with  Communication with PCP/Specialists  Joice Lofts with [facility]]  Advanced Directive Interventions   Advanced Directives Discussed/Reviewed End of Life  [facility] Hospice          SDOH assessments and interventions completed:  No     Care Coordination Interventions:  Yes, provided   Follow up plan: No further intervention required.   Encounter Outcome:  Patient Visit Completed

## 2023-09-05 ENCOUNTER — Inpatient Hospital Stay: Payer: Medicare HMO

## 2023-09-09 DEATH — deceased

## 2023-09-10 ENCOUNTER — Ambulatory Visit: Payer: Medicare HMO

## 2023-09-10 ENCOUNTER — Other Ambulatory Visit: Payer: Medicare HMO

## 2023-09-12 ENCOUNTER — Other Ambulatory Visit: Payer: Self-pay | Admitting: Cardiovascular Disease

## 2023-09-26 ENCOUNTER — Ambulatory Visit: Payer: Medicare HMO | Admitting: Podiatry

## 2023-12-26 ENCOUNTER — Ambulatory Visit: Payer: Medicare HMO | Admitting: Podiatry
